# Patient Record
Sex: Female | Born: 1955 | Race: White | Hispanic: No | Smoking: Former smoker
Health system: Southern US, Community
[De-identification: ages and names within clinical notes are randomized; demographics above are authoritative.]

## PROBLEM LIST (undated history)

## (undated) DIAGNOSIS — H53489 Generalized contraction of visual field, unspecified eye: Secondary | ICD-10-CM

## (undated) DIAGNOSIS — K219 Gastro-esophageal reflux disease without esophagitis: Secondary | ICD-10-CM

## (undated) DIAGNOSIS — I82409 Acute embolism and thrombosis of unspecified deep veins of unspecified lower extremity: Secondary | ICD-10-CM

## (undated) DIAGNOSIS — Z8719 Personal history of other diseases of the digestive system: Secondary | ICD-10-CM

## (undated) DIAGNOSIS — R4182 Altered mental status, unspecified: Secondary | ICD-10-CM

## (undated) DIAGNOSIS — R519 Headache, unspecified: Secondary | ICD-10-CM

## (undated) DIAGNOSIS — R51 Headache: Secondary | ICD-10-CM

## (undated) DIAGNOSIS — G629 Polyneuropathy, unspecified: Secondary | ICD-10-CM

## (undated) DIAGNOSIS — R569 Unspecified convulsions: Secondary | ICD-10-CM

## (undated) DIAGNOSIS — I639 Cerebral infarction, unspecified: Secondary | ICD-10-CM

## (undated) DIAGNOSIS — IMO0002 Reserved for concepts with insufficient information to code with codable children: Secondary | ICD-10-CM

## (undated) DIAGNOSIS — I619 Nontraumatic intracerebral hemorrhage, unspecified: Secondary | ICD-10-CM

## (undated) DIAGNOSIS — J449 Chronic obstructive pulmonary disease, unspecified: Secondary | ICD-10-CM

## (undated) DIAGNOSIS — N200 Calculus of kidney: Secondary | ICD-10-CM

## (undated) DIAGNOSIS — Z87442 Personal history of urinary calculi: Secondary | ICD-10-CM

## (undated) DIAGNOSIS — Z9289 Personal history of other medical treatment: Secondary | ICD-10-CM

## (undated) DIAGNOSIS — G8929 Other chronic pain: Secondary | ICD-10-CM

## (undated) DIAGNOSIS — Z8711 Personal history of peptic ulcer disease: Secondary | ICD-10-CM

## (undated) DIAGNOSIS — M549 Dorsalgia, unspecified: Secondary | ICD-10-CM

## (undated) HISTORY — PX: HEMORRHOID SURGERY: SHX153

---

## 1987-08-05 DIAGNOSIS — I619 Nontraumatic intracerebral hemorrhage, unspecified: Secondary | ICD-10-CM

## 1987-08-05 HISTORY — PX: BRAIN SURGERY: SHX531

## 1987-08-05 HISTORY — DX: Nontraumatic intracerebral hemorrhage, unspecified: I61.9

## 1989-04-04 DIAGNOSIS — I639 Cerebral infarction, unspecified: Secondary | ICD-10-CM

## 1989-04-04 HISTORY — PX: TUBAL LIGATION: SHX77

## 1989-04-04 HISTORY — DX: Cerebral infarction, unspecified: I63.9

## 1998-03-22 ENCOUNTER — Emergency Department (HOSPITAL_COMMUNITY): Admission: EM | Admit: 1998-03-22 | Discharge: 1998-03-22 | Payer: Self-pay | Admitting: Emergency Medicine

## 1998-07-06 ENCOUNTER — Emergency Department (HOSPITAL_COMMUNITY): Admission: EM | Admit: 1998-07-06 | Discharge: 1998-07-06 | Payer: Self-pay | Admitting: Emergency Medicine

## 1998-07-06 ENCOUNTER — Encounter: Payer: Self-pay | Admitting: Emergency Medicine

## 1999-02-26 ENCOUNTER — Emergency Department (HOSPITAL_COMMUNITY): Admission: EM | Admit: 1999-02-26 | Discharge: 1999-02-26 | Payer: Self-pay | Admitting: Emergency Medicine

## 1999-02-26 ENCOUNTER — Encounter: Payer: Self-pay | Admitting: Emergency Medicine

## 1999-05-23 ENCOUNTER — Emergency Department (HOSPITAL_COMMUNITY): Admission: EM | Admit: 1999-05-23 | Discharge: 1999-05-23 | Payer: Self-pay | Admitting: *Deleted

## 1999-05-24 ENCOUNTER — Emergency Department (HOSPITAL_COMMUNITY): Admission: EM | Admit: 1999-05-24 | Discharge: 1999-05-24 | Payer: Self-pay

## 1999-06-10 ENCOUNTER — Encounter: Admission: RE | Admit: 1999-06-10 | Discharge: 1999-09-08 | Payer: Self-pay | Admitting: Pediatrics

## 1999-07-09 ENCOUNTER — Encounter: Payer: Self-pay | Admitting: Emergency Medicine

## 1999-07-09 ENCOUNTER — Encounter: Payer: Self-pay | Admitting: Neurology

## 1999-07-09 ENCOUNTER — Emergency Department (HOSPITAL_COMMUNITY): Admission: EM | Admit: 1999-07-09 | Discharge: 1999-07-09 | Payer: Self-pay | Admitting: Emergency Medicine

## 1999-07-10 ENCOUNTER — Inpatient Hospital Stay (HOSPITAL_COMMUNITY): Admission: EM | Admit: 1999-07-10 | Discharge: 1999-07-11 | Payer: Self-pay | Admitting: Emergency Medicine

## 1999-07-10 ENCOUNTER — Encounter: Payer: Self-pay | Admitting: Emergency Medicine

## 1999-08-15 ENCOUNTER — Encounter: Payer: Self-pay | Admitting: Emergency Medicine

## 1999-08-15 ENCOUNTER — Emergency Department (HOSPITAL_COMMUNITY): Admission: EM | Admit: 1999-08-15 | Discharge: 1999-08-15 | Payer: Self-pay | Admitting: Emergency Medicine

## 1999-09-29 ENCOUNTER — Emergency Department (HOSPITAL_COMMUNITY): Admission: EM | Admit: 1999-09-29 | Discharge: 1999-09-29 | Payer: Self-pay | Admitting: Emergency Medicine

## 1999-10-31 ENCOUNTER — Emergency Department (HOSPITAL_COMMUNITY): Admission: EM | Admit: 1999-10-31 | Discharge: 1999-11-01 | Payer: Self-pay | Admitting: Emergency Medicine

## 2000-01-11 ENCOUNTER — Emergency Department (HOSPITAL_COMMUNITY): Admission: EM | Admit: 2000-01-11 | Discharge: 2000-01-11 | Payer: Self-pay | Admitting: Emergency Medicine

## 2000-05-09 ENCOUNTER — Emergency Department (HOSPITAL_COMMUNITY): Admission: EM | Admit: 2000-05-09 | Discharge: 2000-05-09 | Payer: Self-pay | Admitting: Emergency Medicine

## 2000-07-07 ENCOUNTER — Encounter: Payer: Self-pay | Admitting: Emergency Medicine

## 2000-07-07 ENCOUNTER — Emergency Department (HOSPITAL_COMMUNITY): Admission: EM | Admit: 2000-07-07 | Discharge: 2000-07-07 | Payer: Self-pay | Admitting: Emergency Medicine

## 2000-07-27 ENCOUNTER — Emergency Department (HOSPITAL_COMMUNITY): Admission: EM | Admit: 2000-07-27 | Discharge: 2000-07-27 | Payer: Self-pay | Admitting: Emergency Medicine

## 2000-07-27 ENCOUNTER — Encounter: Payer: Self-pay | Admitting: Emergency Medicine

## 2000-07-27 ENCOUNTER — Encounter: Payer: Self-pay | Admitting: Neurology

## 2000-10-13 ENCOUNTER — Encounter: Payer: Self-pay | Admitting: Emergency Medicine

## 2000-10-13 ENCOUNTER — Emergency Department (HOSPITAL_COMMUNITY): Admission: EM | Admit: 2000-10-13 | Discharge: 2000-10-14 | Payer: Self-pay | Admitting: Emergency Medicine

## 2000-10-23 ENCOUNTER — Emergency Department (HOSPITAL_COMMUNITY): Admission: EM | Admit: 2000-10-23 | Discharge: 2000-10-24 | Payer: Self-pay | Admitting: *Deleted

## 2001-01-02 ENCOUNTER — Emergency Department (HOSPITAL_COMMUNITY): Admission: EM | Admit: 2001-01-02 | Discharge: 2001-01-02 | Payer: Self-pay | Admitting: Emergency Medicine

## 2001-02-12 ENCOUNTER — Encounter: Payer: Self-pay | Admitting: Internal Medicine

## 2001-02-12 ENCOUNTER — Emergency Department (HOSPITAL_COMMUNITY): Admission: EM | Admit: 2001-02-12 | Discharge: 2001-02-12 | Payer: Self-pay | Admitting: Emergency Medicine

## 2001-02-19 ENCOUNTER — Ambulatory Visit (HOSPITAL_COMMUNITY): Admission: RE | Admit: 2001-02-19 | Discharge: 2001-02-19 | Payer: Self-pay | Admitting: Family Medicine

## 2001-04-22 ENCOUNTER — Ambulatory Visit (HOSPITAL_COMMUNITY): Admission: RE | Admit: 2001-04-22 | Discharge: 2001-04-22 | Payer: Self-pay | Admitting: Gastroenterology

## 2001-06-19 ENCOUNTER — Emergency Department (HOSPITAL_COMMUNITY): Admission: EM | Admit: 2001-06-19 | Discharge: 2001-06-19 | Payer: Self-pay | Admitting: Emergency Medicine

## 2001-06-19 ENCOUNTER — Encounter: Payer: Self-pay | Admitting: Emergency Medicine

## 2001-08-01 ENCOUNTER — Emergency Department (HOSPITAL_COMMUNITY): Admission: EM | Admit: 2001-08-01 | Discharge: 2001-08-01 | Payer: Self-pay

## 2001-08-24 ENCOUNTER — Emergency Department (HOSPITAL_COMMUNITY): Admission: EM | Admit: 2001-08-24 | Discharge: 2001-08-24 | Payer: Self-pay | Admitting: Emergency Medicine

## 2001-12-22 ENCOUNTER — Ambulatory Visit (HOSPITAL_COMMUNITY): Admission: RE | Admit: 2001-12-22 | Discharge: 2001-12-22 | Payer: Self-pay | Admitting: Internal Medicine

## 2001-12-23 ENCOUNTER — Emergency Department (HOSPITAL_COMMUNITY): Admission: EM | Admit: 2001-12-23 | Discharge: 2001-12-23 | Payer: Self-pay | Admitting: Emergency Medicine

## 2002-07-26 ENCOUNTER — Emergency Department (HOSPITAL_COMMUNITY): Admission: EM | Admit: 2002-07-26 | Discharge: 2002-07-26 | Payer: Self-pay | Admitting: Emergency Medicine

## 2002-07-26 ENCOUNTER — Encounter: Payer: Self-pay | Admitting: Emergency Medicine

## 2002-08-31 ENCOUNTER — Emergency Department (HOSPITAL_COMMUNITY): Admission: EM | Admit: 2002-08-31 | Discharge: 2002-08-31 | Payer: Self-pay | Admitting: Emergency Medicine

## 2002-09-12 ENCOUNTER — Emergency Department (HOSPITAL_COMMUNITY): Admission: EM | Admit: 2002-09-12 | Discharge: 2002-09-12 | Payer: Self-pay | Admitting: Emergency Medicine

## 2002-09-12 ENCOUNTER — Encounter: Payer: Self-pay | Admitting: Emergency Medicine

## 2002-11-30 ENCOUNTER — Emergency Department (HOSPITAL_COMMUNITY): Admission: EM | Admit: 2002-11-30 | Discharge: 2002-11-30 | Payer: Self-pay | Admitting: Emergency Medicine

## 2002-11-30 ENCOUNTER — Encounter: Payer: Self-pay | Admitting: Emergency Medicine

## 2002-12-03 ENCOUNTER — Emergency Department (HOSPITAL_COMMUNITY): Admission: EM | Admit: 2002-12-03 | Discharge: 2002-12-03 | Payer: Self-pay

## 2003-02-01 ENCOUNTER — Encounter: Payer: Self-pay | Admitting: Emergency Medicine

## 2003-02-01 ENCOUNTER — Emergency Department (HOSPITAL_COMMUNITY): Admission: EM | Admit: 2003-02-01 | Discharge: 2003-02-01 | Payer: Self-pay | Admitting: Emergency Medicine

## 2003-04-25 ENCOUNTER — Encounter: Payer: Self-pay | Admitting: Emergency Medicine

## 2003-04-25 ENCOUNTER — Emergency Department (HOSPITAL_COMMUNITY): Admission: EM | Admit: 2003-04-25 | Discharge: 2003-04-25 | Payer: Self-pay | Admitting: Internal Medicine

## 2003-05-12 ENCOUNTER — Emergency Department (HOSPITAL_COMMUNITY): Admission: EM | Admit: 2003-05-12 | Discharge: 2003-05-13 | Payer: Self-pay | Admitting: Emergency Medicine

## 2003-06-22 ENCOUNTER — Encounter: Admission: RE | Admit: 2003-06-22 | Discharge: 2003-06-22 | Payer: Self-pay | Admitting: Family Medicine

## 2003-07-10 ENCOUNTER — Encounter: Admission: RE | Admit: 2003-07-10 | Discharge: 2003-08-15 | Payer: Self-pay | Admitting: Family Medicine

## 2003-11-18 ENCOUNTER — Emergency Department (HOSPITAL_COMMUNITY): Admission: EM | Admit: 2003-11-18 | Discharge: 2003-11-18 | Payer: Self-pay | Admitting: Emergency Medicine

## 2004-01-04 ENCOUNTER — Inpatient Hospital Stay (HOSPITAL_COMMUNITY): Admission: EM | Admit: 2004-01-04 | Discharge: 2004-01-05 | Payer: Self-pay | Admitting: Emergency Medicine

## 2004-01-28 ENCOUNTER — Emergency Department (HOSPITAL_COMMUNITY): Admission: EM | Admit: 2004-01-28 | Discharge: 2004-01-28 | Payer: Self-pay | Admitting: *Deleted

## 2004-02-02 IMAGING — CT CT CHEST W/ CM
3 series · 19 of 29 positions shown, 20 images · IV contrast (omnipaque)
Comparison: none

FINDINGS
CLINICAL DATA: CHEST PAIN.  SHORTNESS OF BREATH.  FEVER.  HISTORY OF PREVIOUS DVT.
CT SCAN OF THE CHEST WITH INTRAVENOUS CONTRAST FOR PE PROTOCOL
THE STUDY WAS PERFORMED WITH 150 CC OF OMNIPAQUE 300.
THERE IS NO EVIDENCE OF PULMONARY EMBOLI.  NO MEDIASTINAL OR HILAR MASSES OR ADENOPATHY ARE
APPRECIATED.
IMPRESSION
NEGATIVE.
SCREENING CT SCAN OF THE LOWER EXTREMITIES FOR LONG SEGMENT DVT WITH CONTRAST
THERE IS NO EVIDENCE OF LONG SEGMENT DVT.

[Series 2: dvt 5.0 b30s · axial · 0.64mm/px · z∈[-832,-598]mm · 3 of 25 slices shown, 4 images]
[im 7/25  mediastinal]
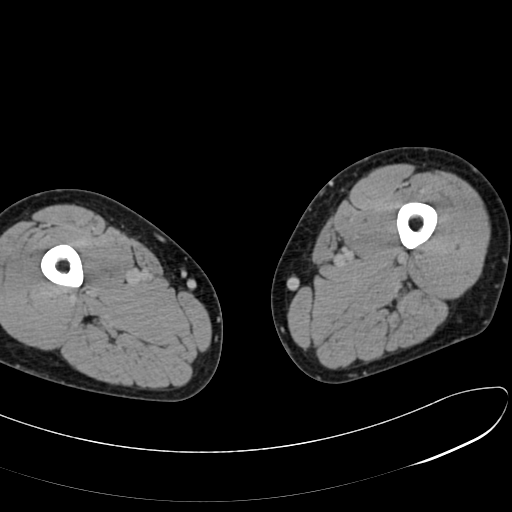
[im 7/25  lung]
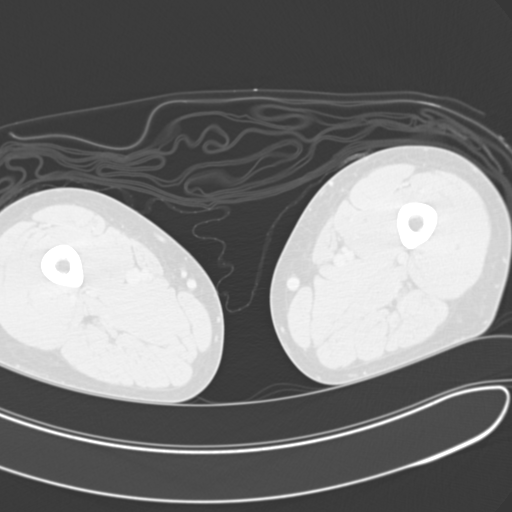
[im 13/25  lung]
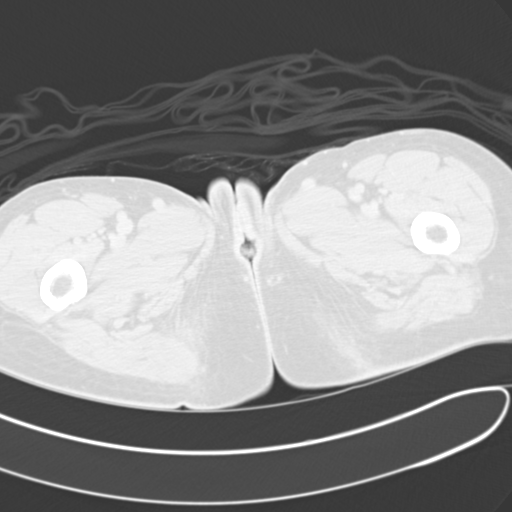
[im 19/25  lung]
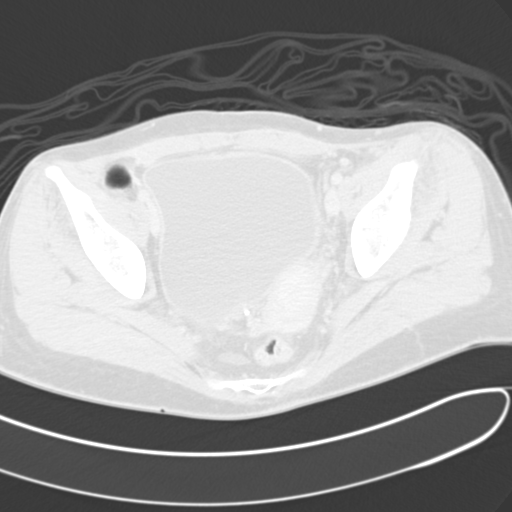

[Series 3: chest/pe 3.0 b30f · axial · 0.66mm/px · z∈[-340,-108]mm · 8 of 64 slices shown]
[im 6/64  lung]
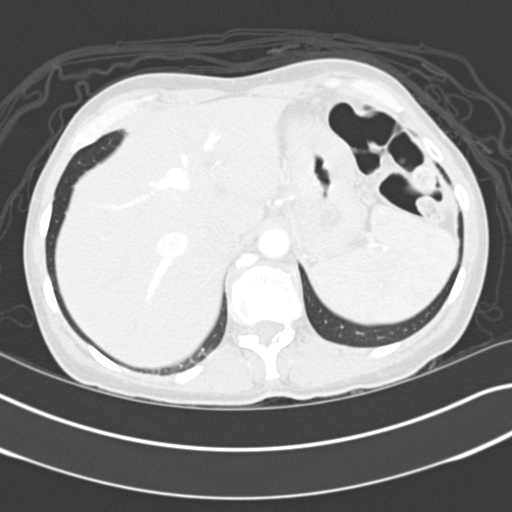
[im 18/64  lung]
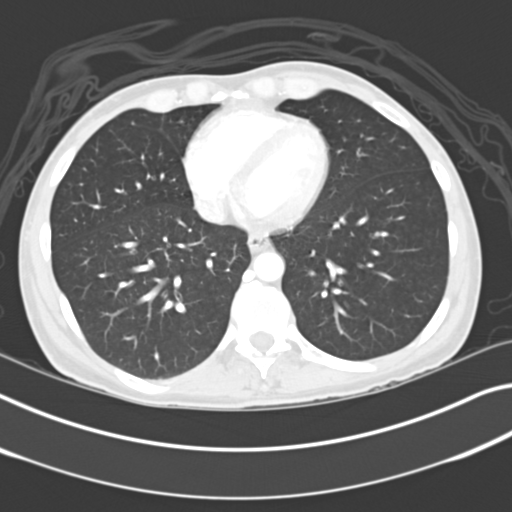
[im 23/64  lung]
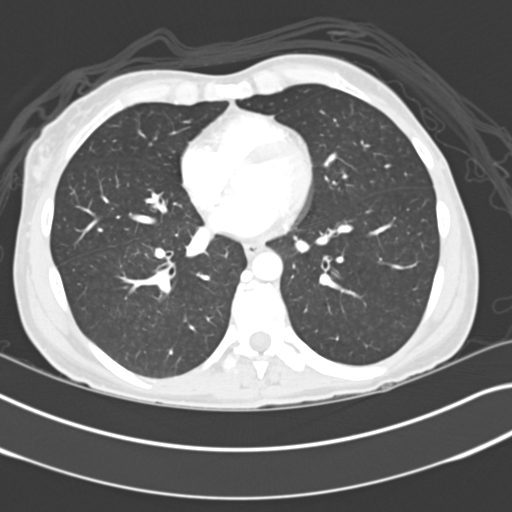
[im 29/64  lung]
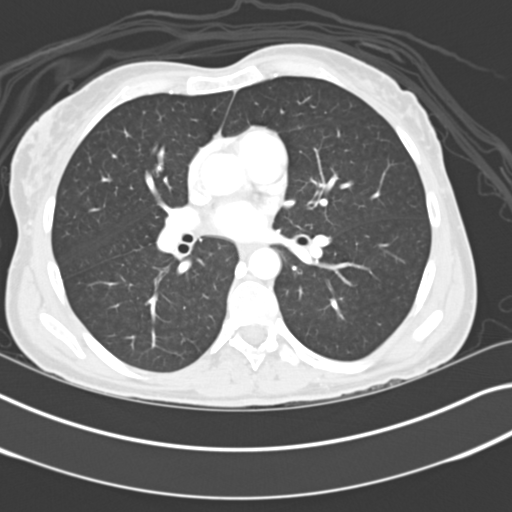
[im 35/64  lung]
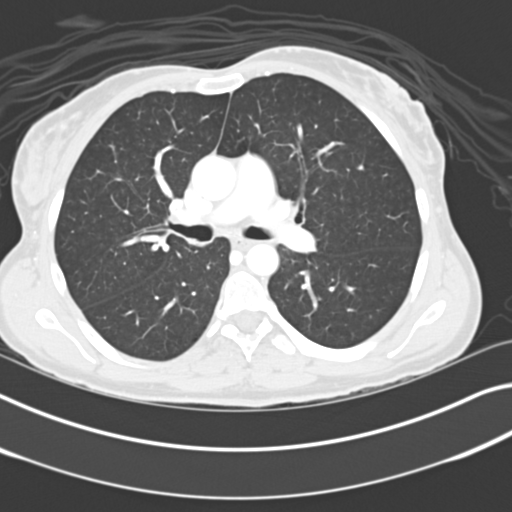
[im 41/64  lung]
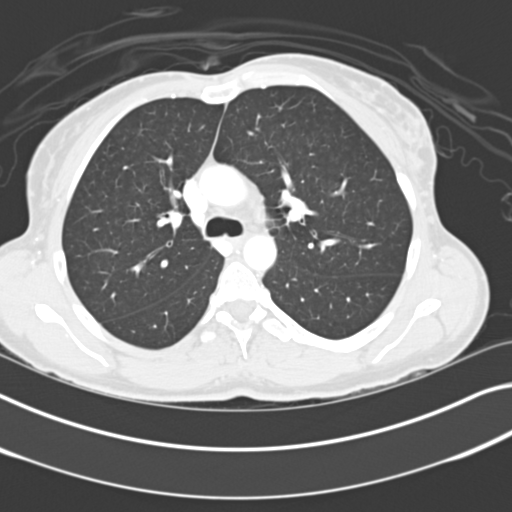
[im 46/64  lung]
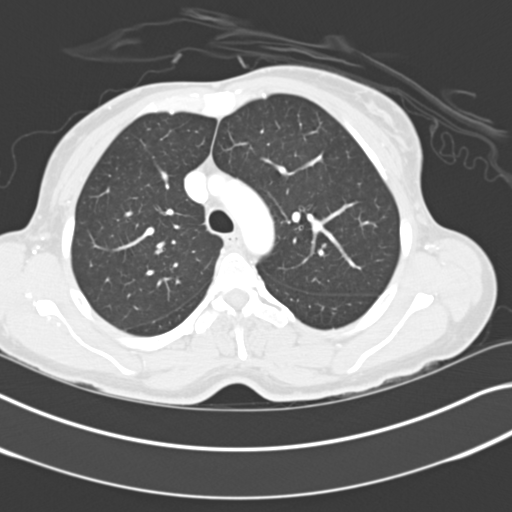
[im 58/64  lung]
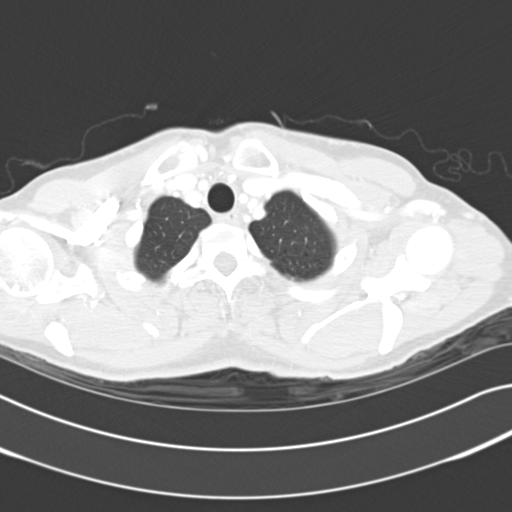

[Series 4: chest/pe 3.0 b70f · axial · 0.66mm/px · z∈[-340,-108]mm · 8 of 59 slices shown]
[im 6/59  lung]
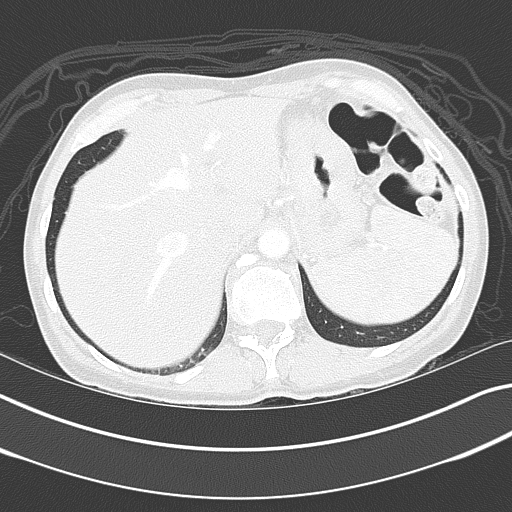
[im 12/59  lung]
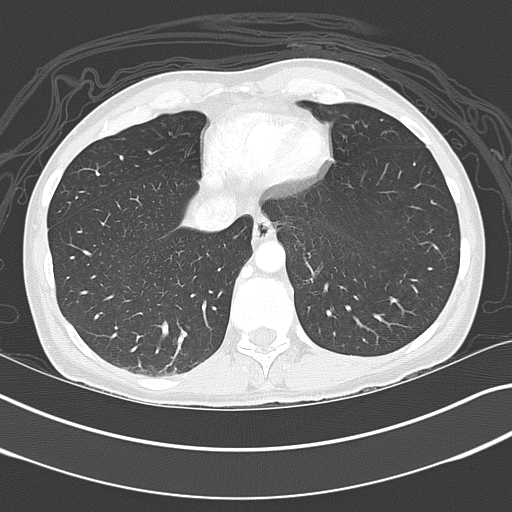
[im 18/59  lung]
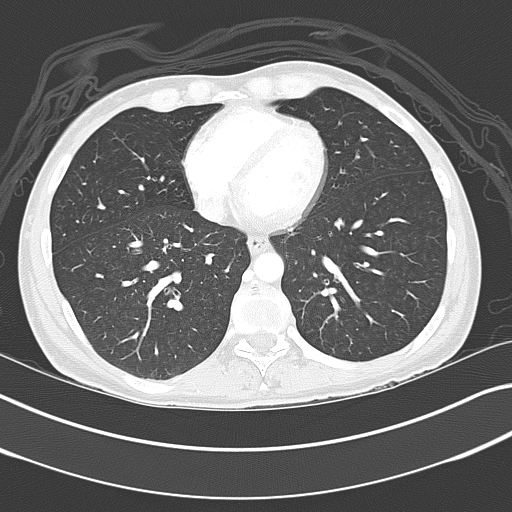
[im 24/59  lung]
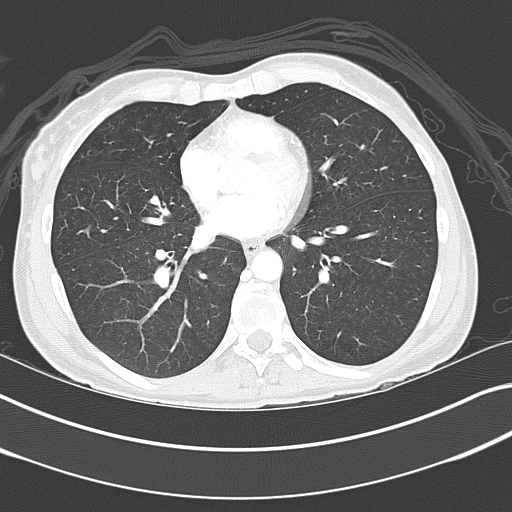
[im 35/59  lung]
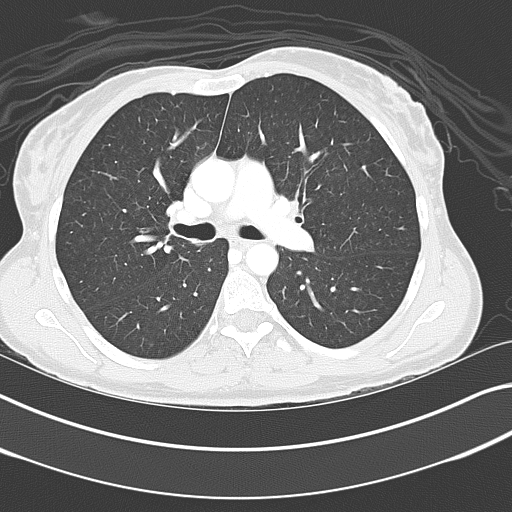
[im 41/59  lung]
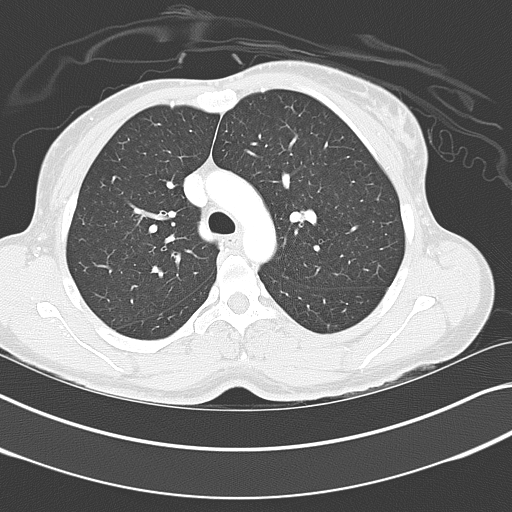
[im 47/59  lung]
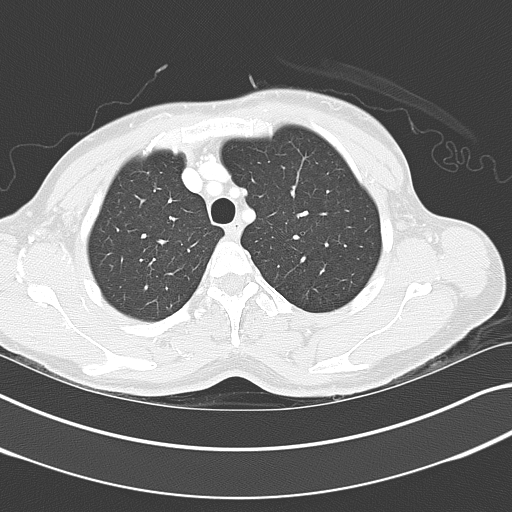
[im 53/59  lung]
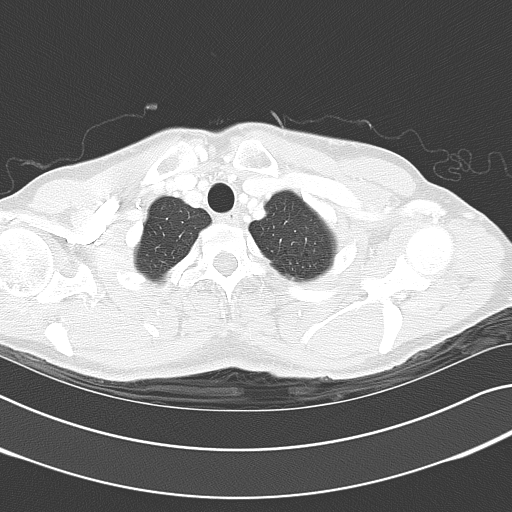

[19 of 29 positions shown; findings below may reference images not displayed]

## 2004-02-25 ENCOUNTER — Inpatient Hospital Stay (HOSPITAL_COMMUNITY): Admission: EM | Admit: 2004-02-25 | Discharge: 2004-02-29 | Payer: Self-pay | Admitting: Emergency Medicine

## 2004-03-09 ENCOUNTER — Emergency Department (HOSPITAL_COMMUNITY): Admission: EM | Admit: 2004-03-09 | Discharge: 2004-03-10 | Payer: Self-pay | Admitting: Emergency Medicine

## 2004-04-01 ENCOUNTER — Emergency Department (HOSPITAL_COMMUNITY): Admission: EM | Admit: 2004-04-01 | Discharge: 2004-04-01 | Payer: Self-pay | Admitting: Emergency Medicine

## 2004-04-10 ENCOUNTER — Emergency Department (HOSPITAL_COMMUNITY): Admission: EM | Admit: 2004-04-10 | Discharge: 2004-04-10 | Payer: Self-pay | Admitting: Emergency Medicine

## 2004-04-10 ENCOUNTER — Inpatient Hospital Stay (HOSPITAL_COMMUNITY): Admission: AD | Admit: 2004-04-10 | Discharge: 2004-04-12 | Payer: Self-pay | Admitting: Neurology

## 2004-05-03 ENCOUNTER — Emergency Department (HOSPITAL_COMMUNITY): Admission: EM | Admit: 2004-05-03 | Discharge: 2004-05-04 | Payer: Self-pay | Admitting: Emergency Medicine

## 2004-05-07 ENCOUNTER — Emergency Department (HOSPITAL_COMMUNITY): Admission: EM | Admit: 2004-05-07 | Discharge: 2004-05-08 | Payer: Self-pay | Admitting: Emergency Medicine

## 2004-05-12 ENCOUNTER — Emergency Department (HOSPITAL_COMMUNITY): Admission: EM | Admit: 2004-05-12 | Discharge: 2004-05-13 | Payer: Self-pay | Admitting: *Deleted

## 2004-09-24 IMAGING — CR DG CHEST 2V
3 series · 3 of 3 positions shown · non-contrast
Comparison: none

CLINICAL DATA: Asthma exacerbation.  Hypertension.  Dyspnea.  
 CHEST (TWO VIEWS) 
 No prior exam for comparison. 
 Normal heart size, mediastinal contours and vascularity.  Hyperexpanded lungs and peribronchial thickening compatible with asthmatic/COPD changes.  No infiltrate, effusion or pneumothorax.  
 IMPRESSION
 Chronic obstructive changes.  No acute abnormalities.

[view not recorded (1 of 3)]
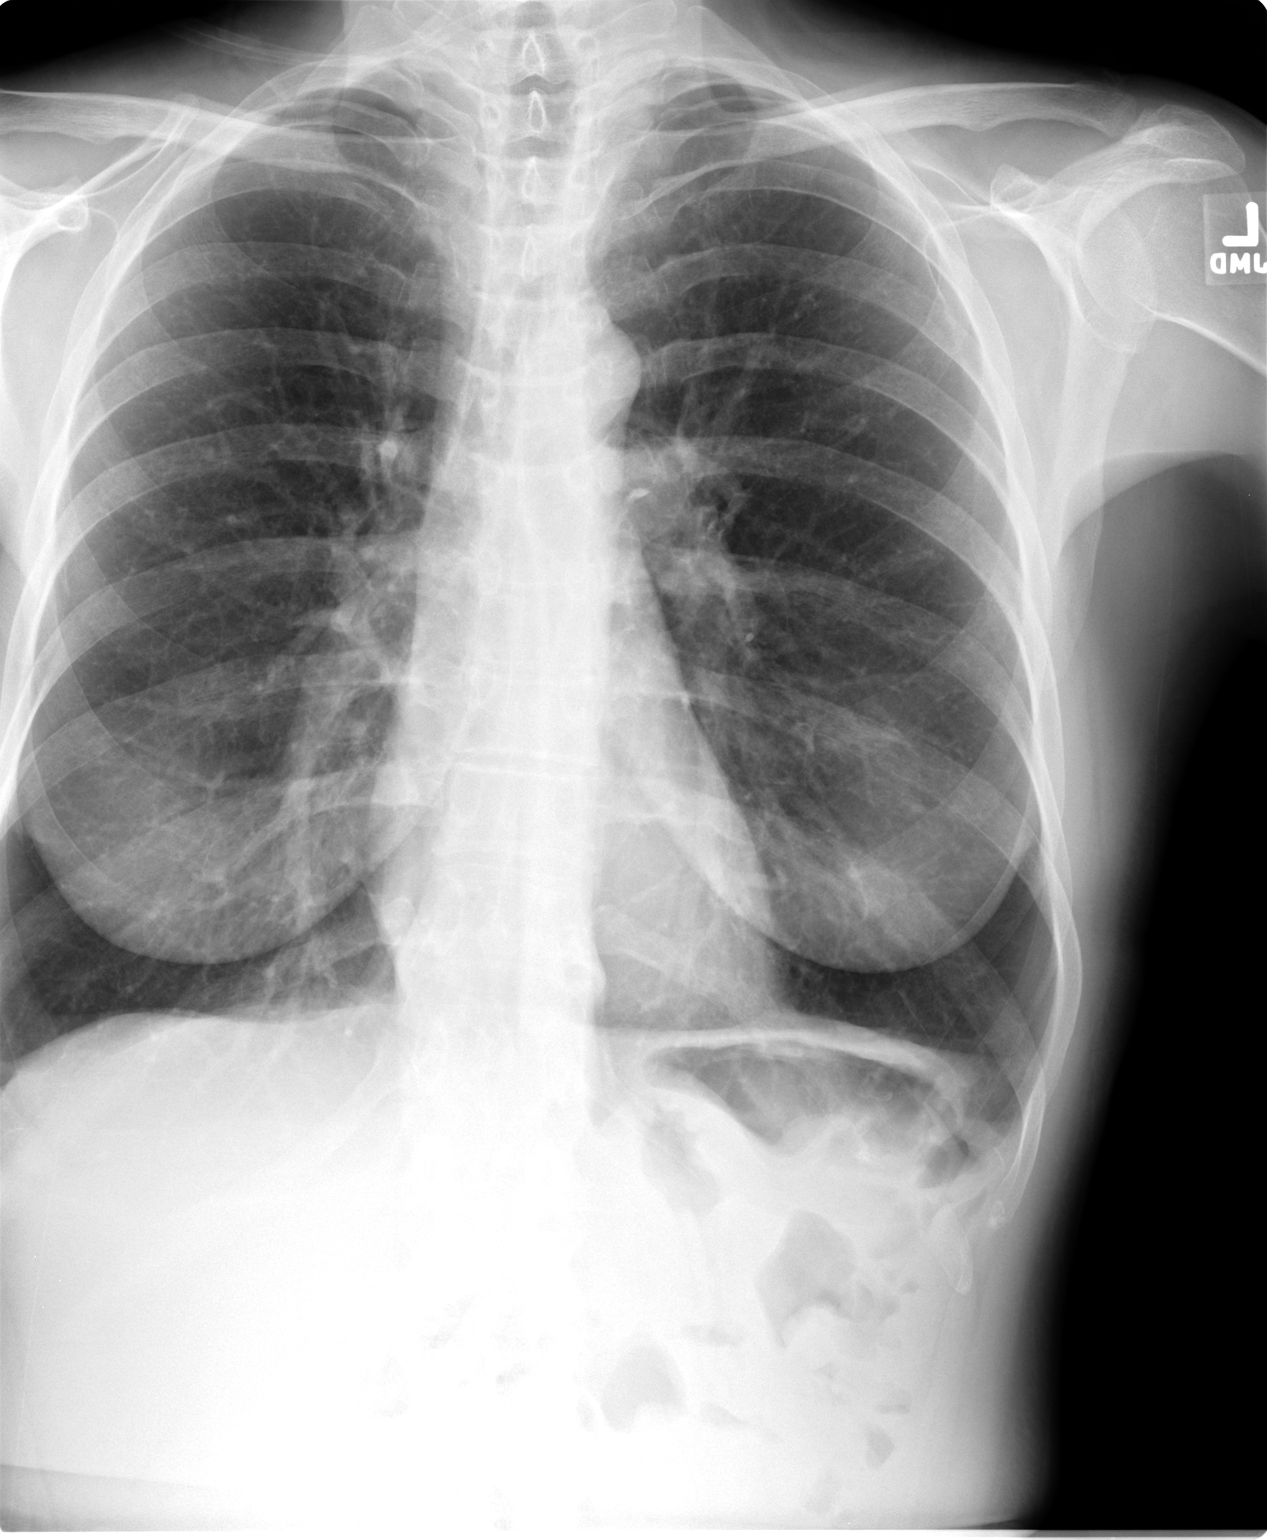

[view not recorded (2 of 3)]
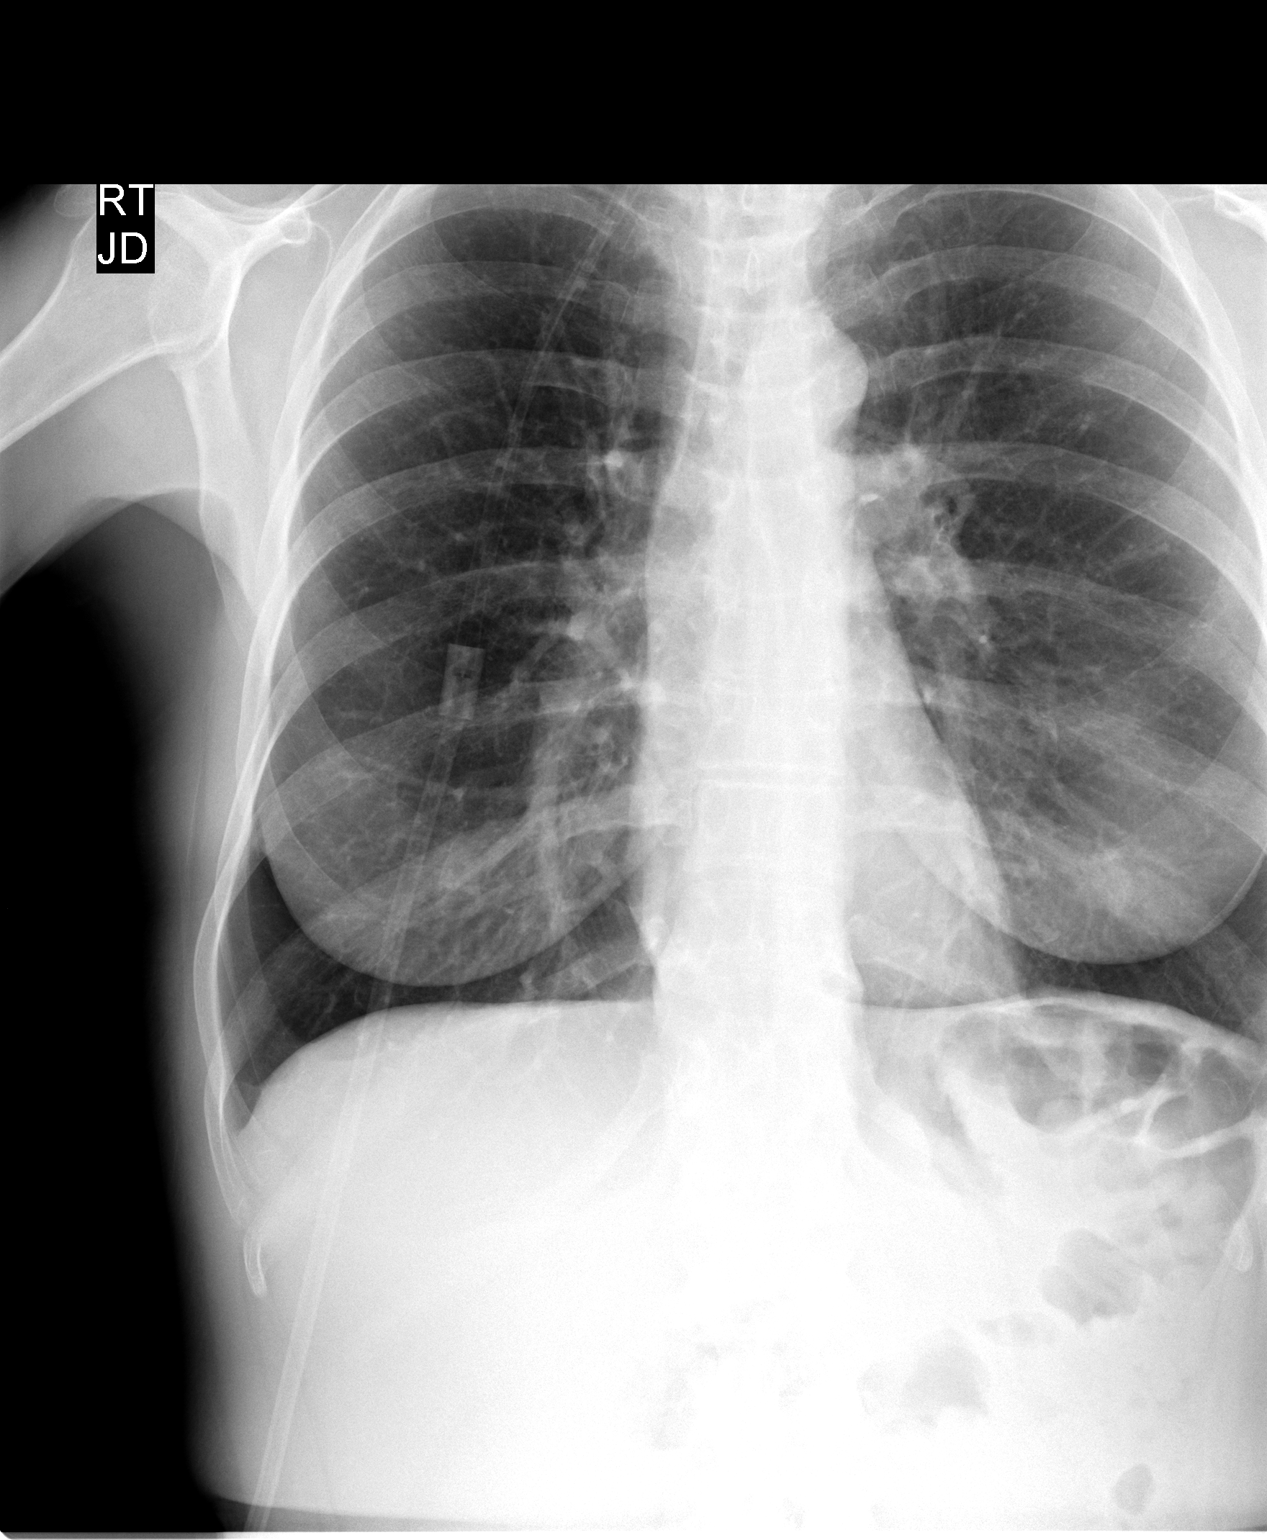

[view not recorded (3 of 3)]
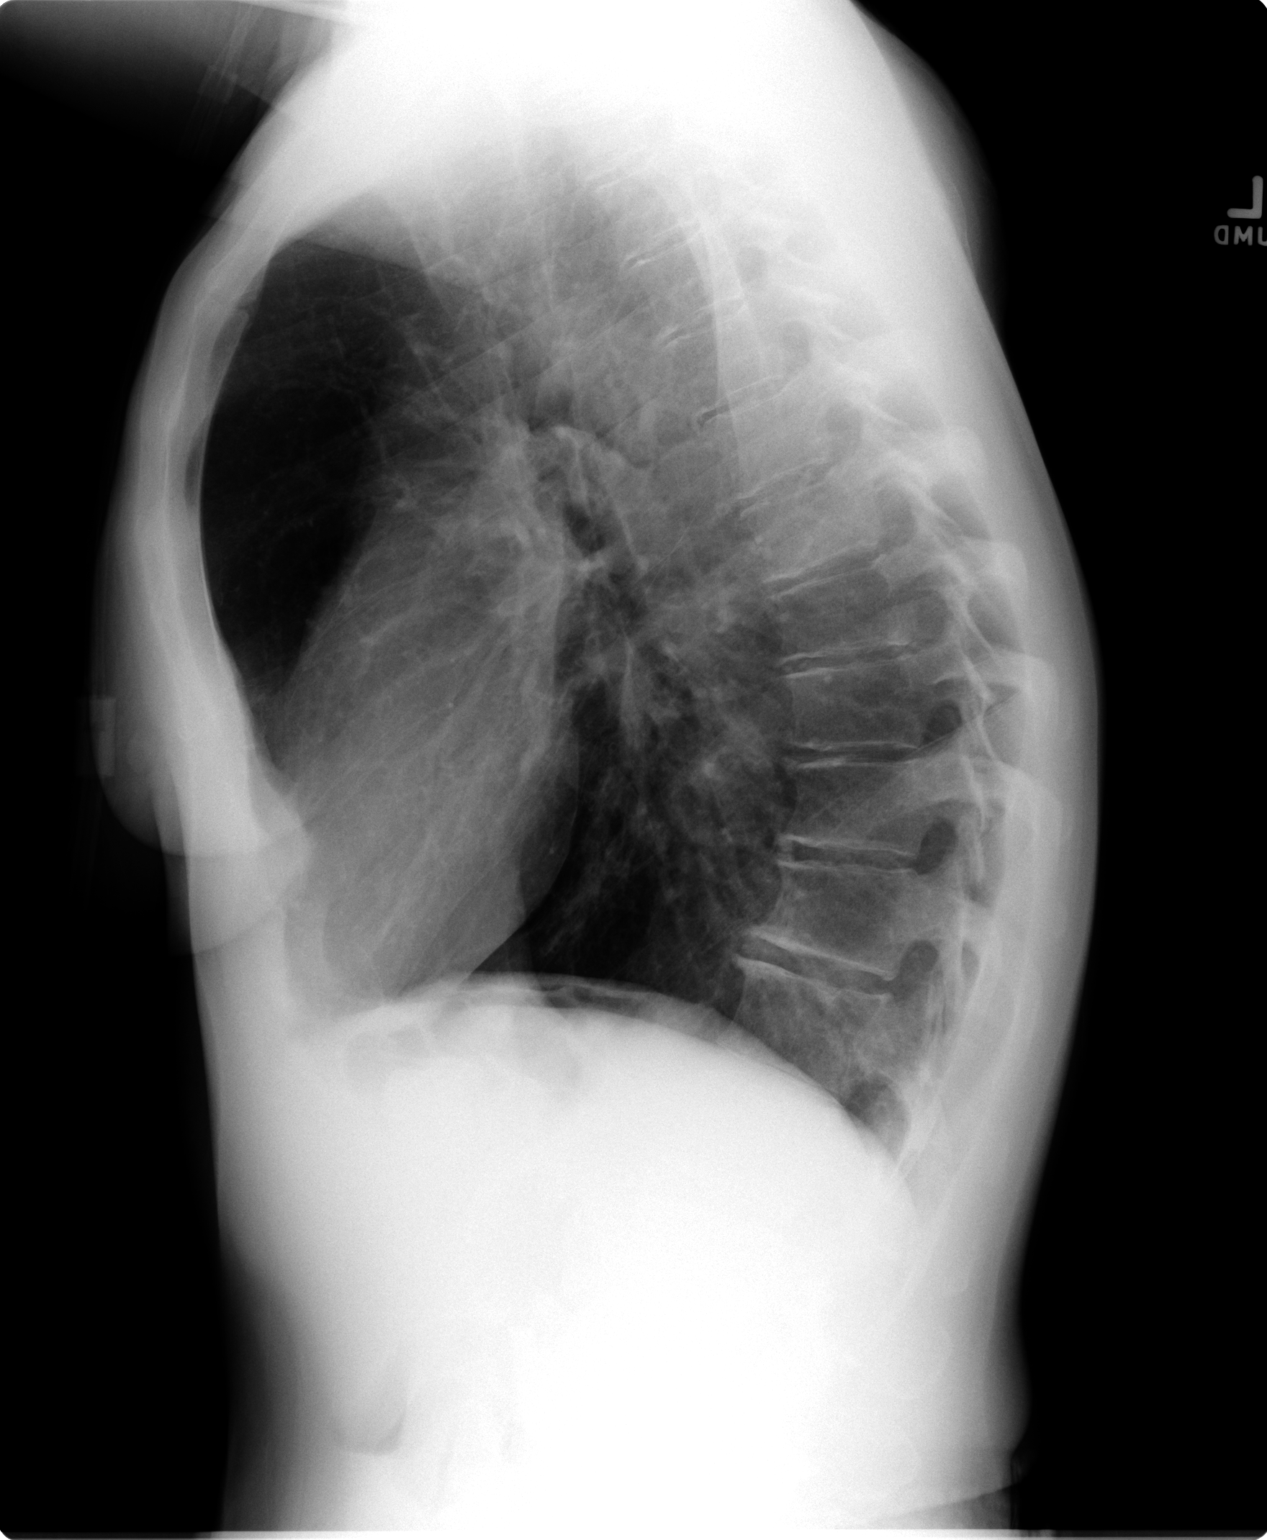

[3 of 3 positions shown; findings below may reference images not displayed]

## 2004-12-09 ENCOUNTER — Emergency Department (HOSPITAL_COMMUNITY): Admission: EM | Admit: 2004-12-09 | Discharge: 2004-12-09 | Payer: Self-pay | Admitting: *Deleted

## 2005-03-13 IMAGING — CT CT HEAD W/O CM
1 of 2 series · 13 of 30 positions shown, 17 images · non-contrast
Comparison: none

FINDINGS
CLINICAL DATA: PATIENT WITH SEIZURE.  HISTORY OF PREVIOUS INTRACEREBRAL HEMORRHAGE AND SURGERY.
CT BRAIN WITHOUT CONTRAST
COMPARISON IS MADE WITH THE PRIOR EXAMINATION DATED [DATE].
MULTIPLE 5 MM IMAGES REDEMONSTRATE THE LARGE AREA OF ENCEPHALOMALACIA IN THE RIGHT PARIETO-
OCCIPITAL REGION WITH A PREVIOUS CRANIOTOMY.  THERE IS NO INTRACEREBRAL MASS EFFECT OR HEMORRHAGE.
THERE IS NO ACUTE ABNORMALITY.
IMPRESSION
1.   STABLE RIGHT PARIETO-OCCIPITAL ENCEPHALOMALACIA.
2.   NO ACUTE ABNORMALITY.

[Series 2: — · axial · 0.43mm/px · z∈[-210,-90]mm · 13 of 28 slices shown, 17 images]
[im 2/28  brain]
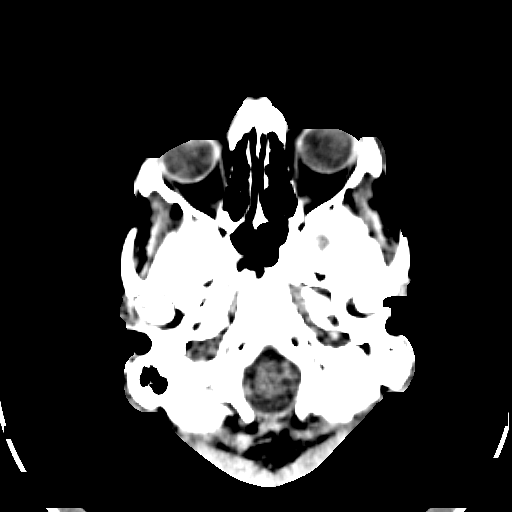
[im 2/28  bone]
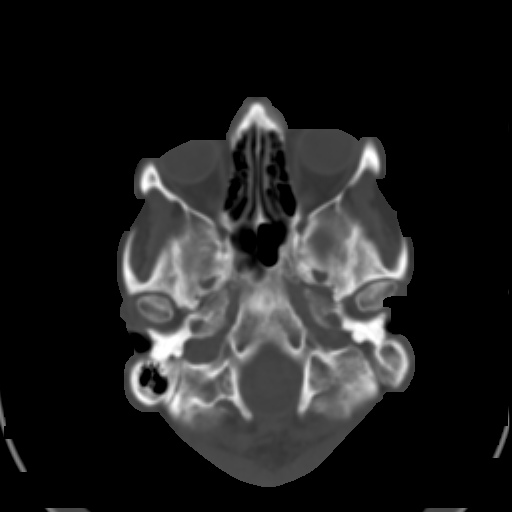
[im 4/28  brain]
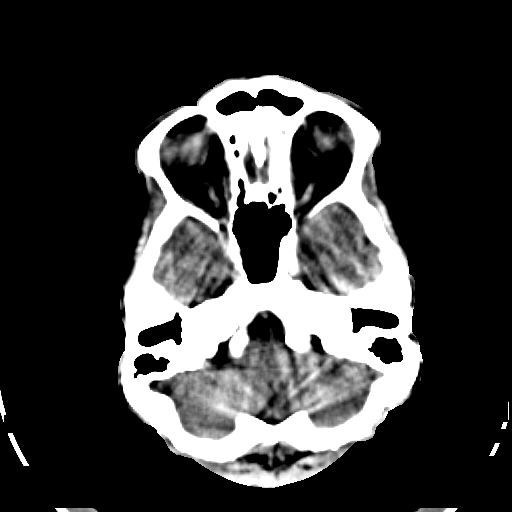
[im 6/28  brain]
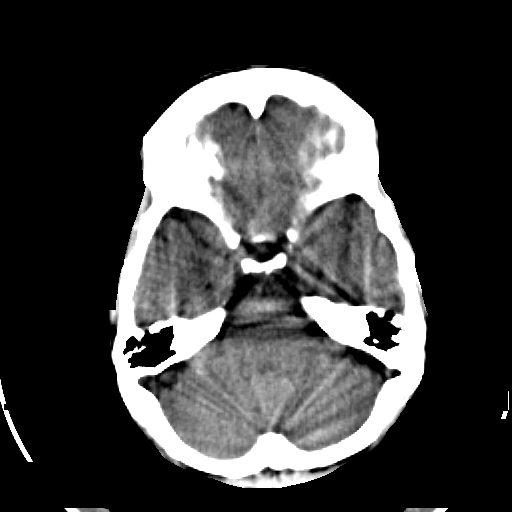
[im 8/28  brain]
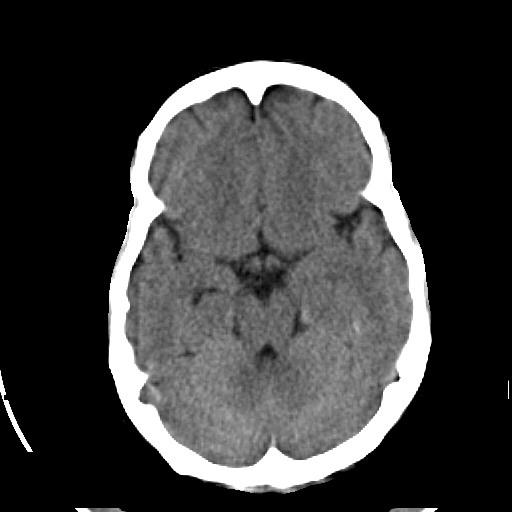
[im 10/28  brain]
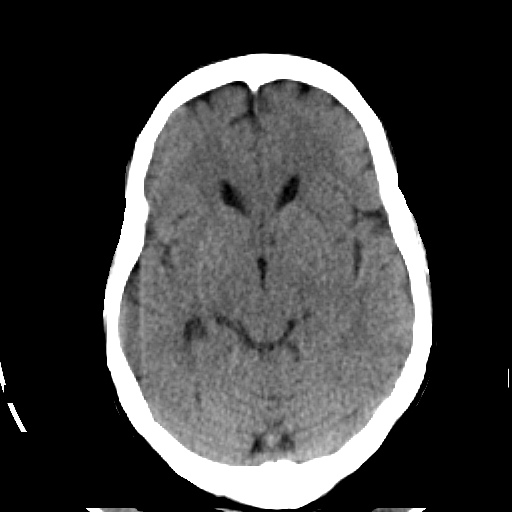
[im 10/28  bone]
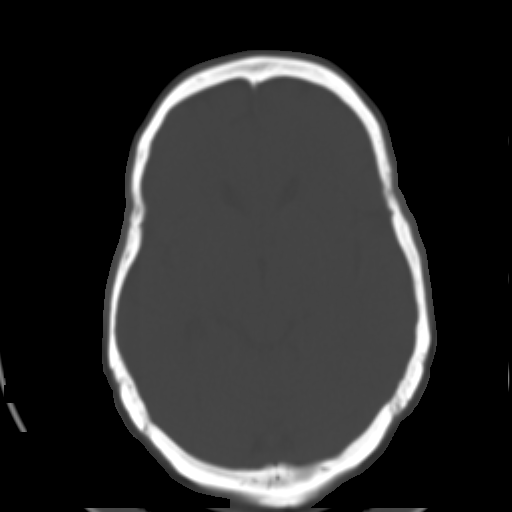
[im 12/28  brain]
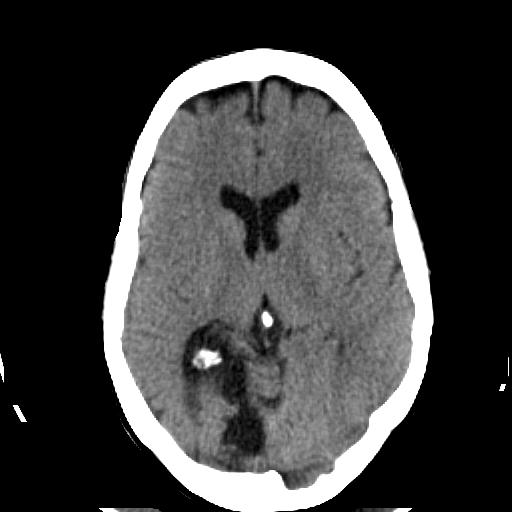
[im 14/28  brain]
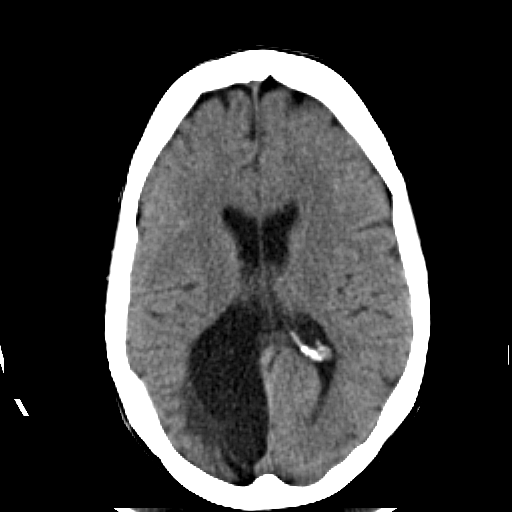
[im 16/28  brain]
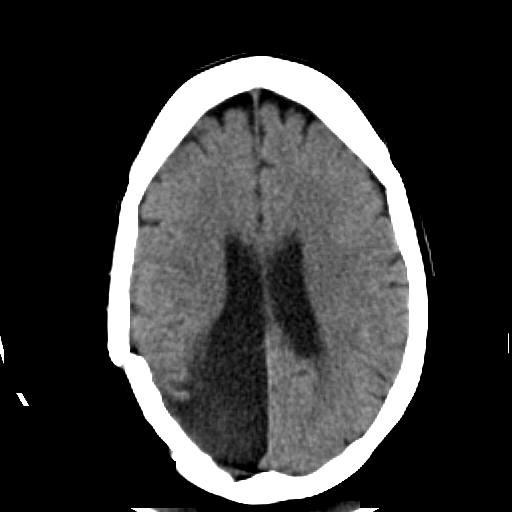
[im 18/28  brain]
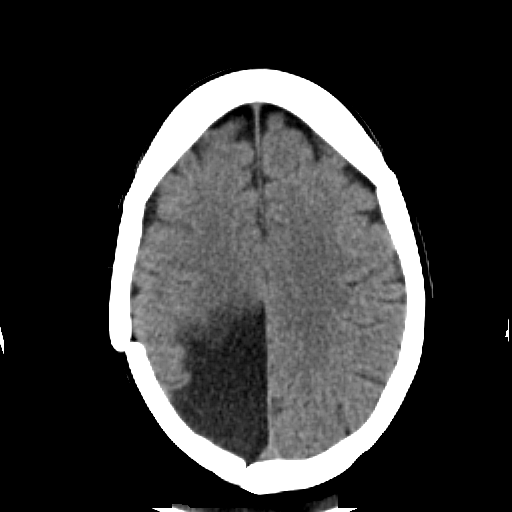
[im 18/28  bone]
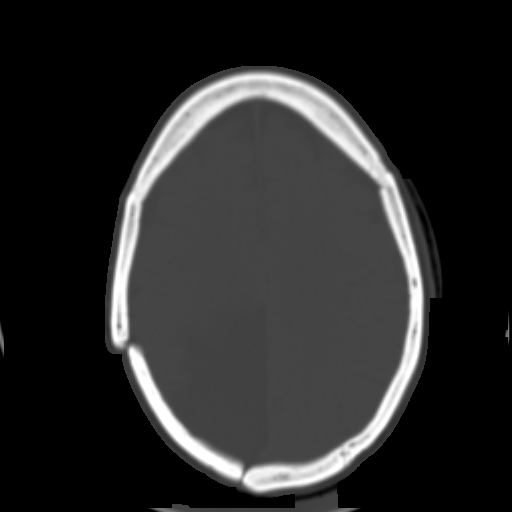
[im 20/28  brain]
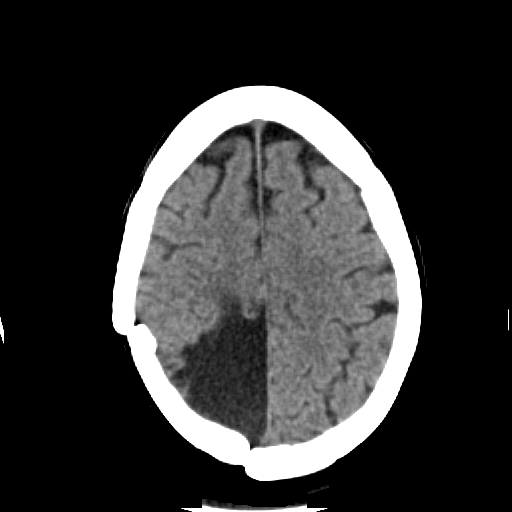
[im 22/28  brain]
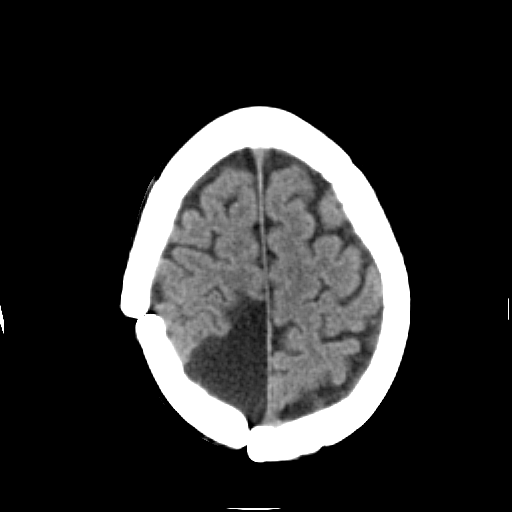
[im 24/28  brain]
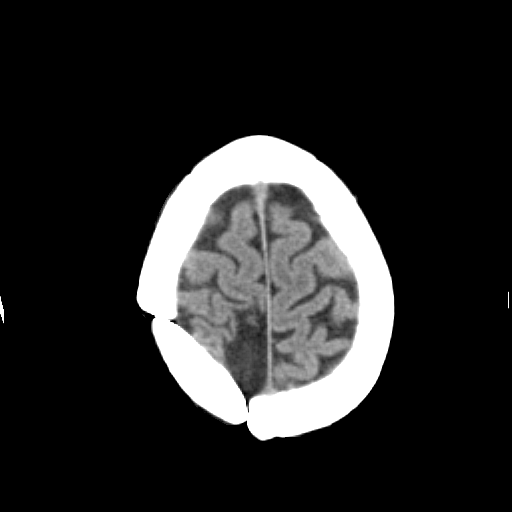
[im 26/28  brain]
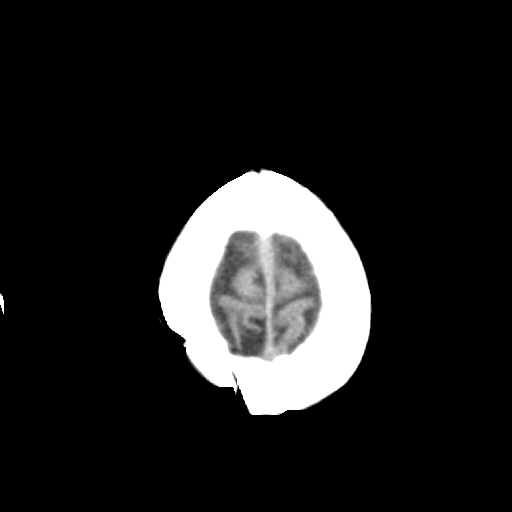
[im 26/28  bone]
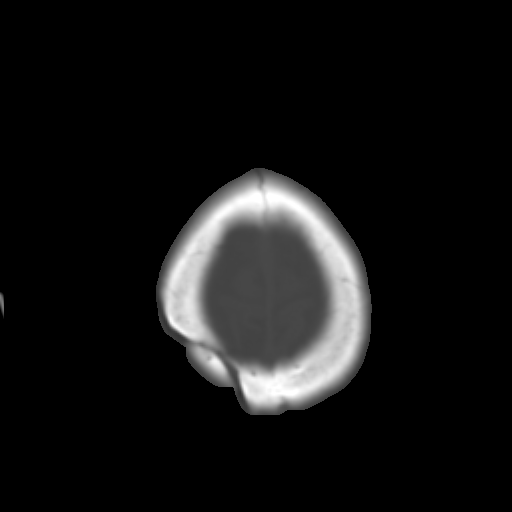

[13 of 30 positions shown; findings below may reference images not displayed]

## 2005-07-15 ENCOUNTER — Emergency Department (HOSPITAL_COMMUNITY): Admission: EM | Admit: 2005-07-15 | Discharge: 2005-07-15 | Payer: Self-pay | Admitting: Emergency Medicine
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
## 2005-10-02 ENCOUNTER — Inpatient Hospital Stay (HOSPITAL_COMMUNITY): Admission: EM | Admit: 2005-10-02 | Discharge: 2005-10-04 | Payer: Self-pay | Admitting: Emergency Medicine

## 2005-10-05 ENCOUNTER — Emergency Department (HOSPITAL_COMMUNITY): Admission: EM | Admit: 2005-10-05 | Discharge: 2005-10-05 | Payer: Self-pay | Admitting: Emergency Medicine

## 2006-03-12 ENCOUNTER — Emergency Department (HOSPITAL_COMMUNITY): Admission: EM | Admit: 2006-03-12 | Discharge: 2006-03-12 | Payer: Self-pay | Admitting: Emergency Medicine

## 2006-04-20 IMAGING — CR DG CHEST SPECIAL VIEW
1 series · 1 of 1 positions shown · non-contrast
Comparison: none

CLINICAL DATA: Asthma.
 PORTABLE CHEST, 03/10/04, [DATE] HOURS
 Nipple markers correspond to the bibasilar nodular densities.  
 IMPRESSION
 Bibasilar nodular densities represent nipple shadows.  No nodules are seen.

[view not recorded]
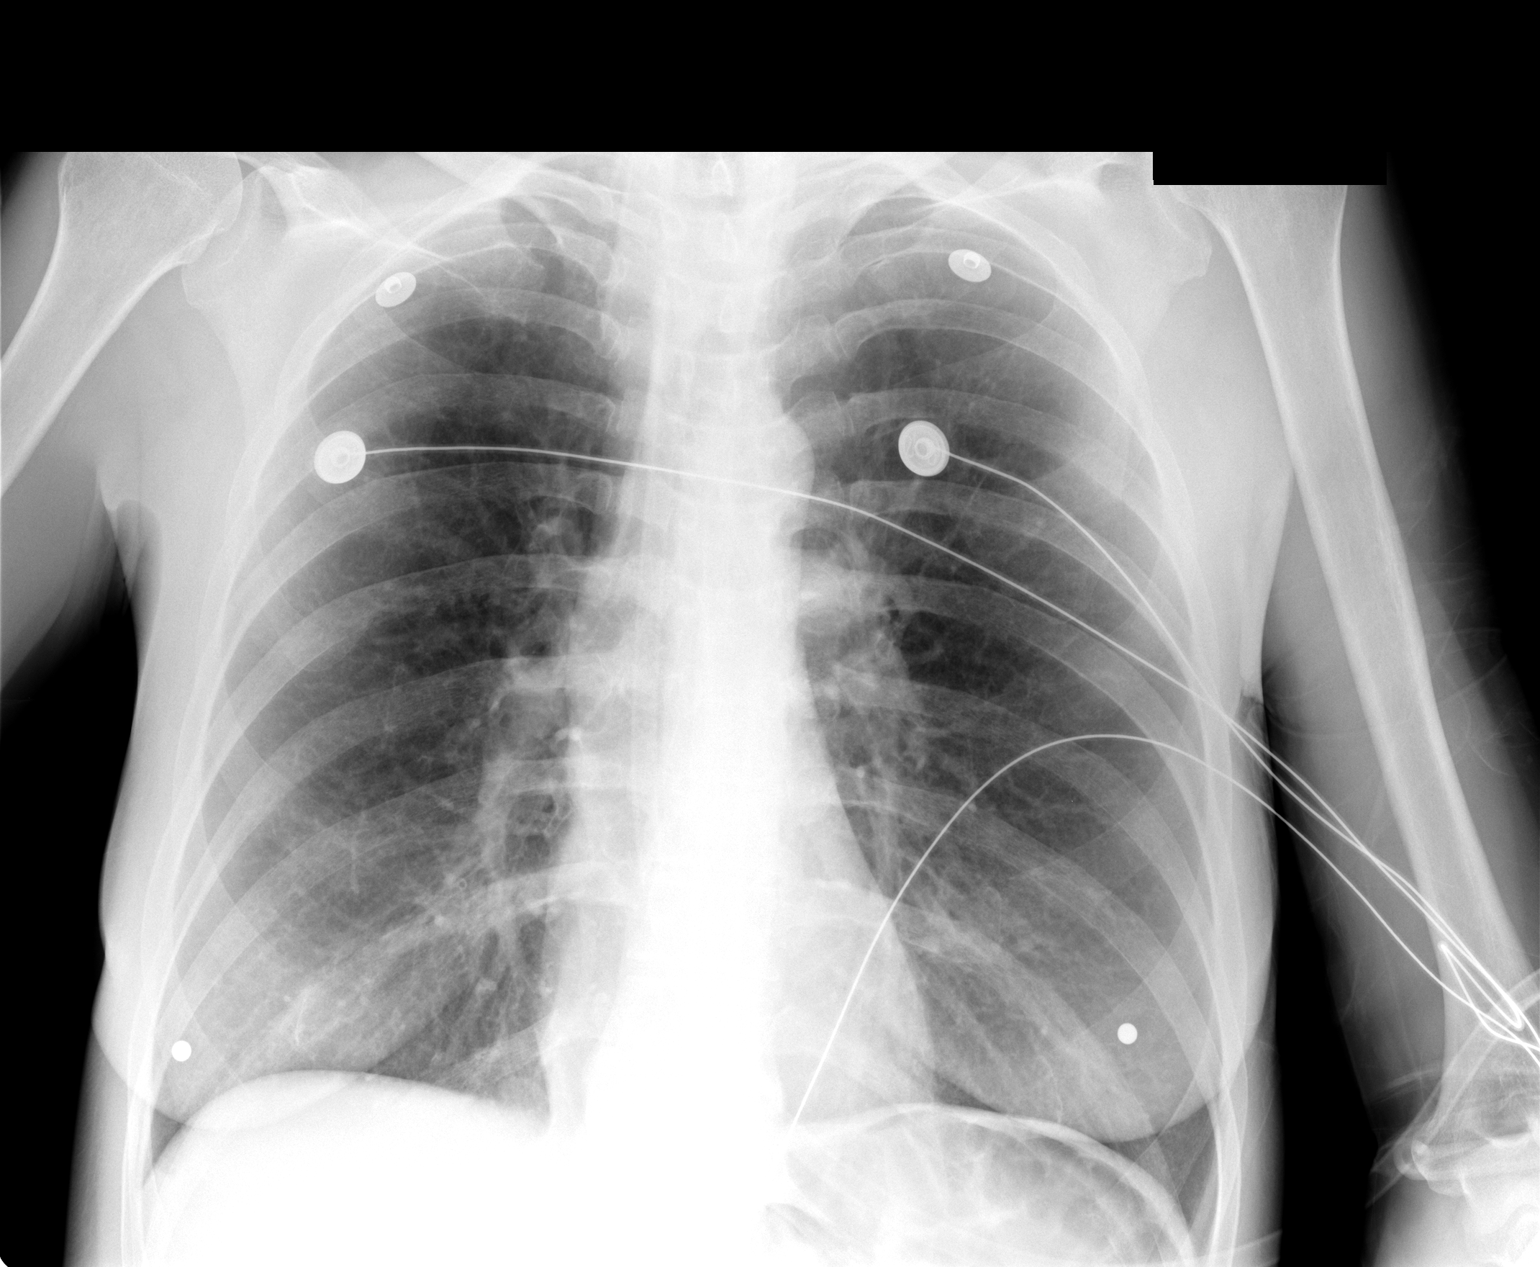

[1 of 1 positions shown; findings below may reference images not displayed]

## 2006-04-20 IMAGING — CR DG CHEST 1V PORT
1 series · 1 of 1 positions shown · non-contrast
Comparison: none

CLINICAL DATA: Short of breath.
 PORTABLE CHEST, 03/10/04, [DATE] HOURS
 The heart is normal in size.  Central bronchitic changes are present.  Bibasilar nodular densities are likely nipple shadows.  The lungs are hyperinflated.  No pneumothoraces or effusions are seen. 
 IMPRESSION
 Hyperinflation and bronchitic changes.  Bibasilar nipple shadows are suspected and can be confirmed with nipple markers.

[view not recorded]
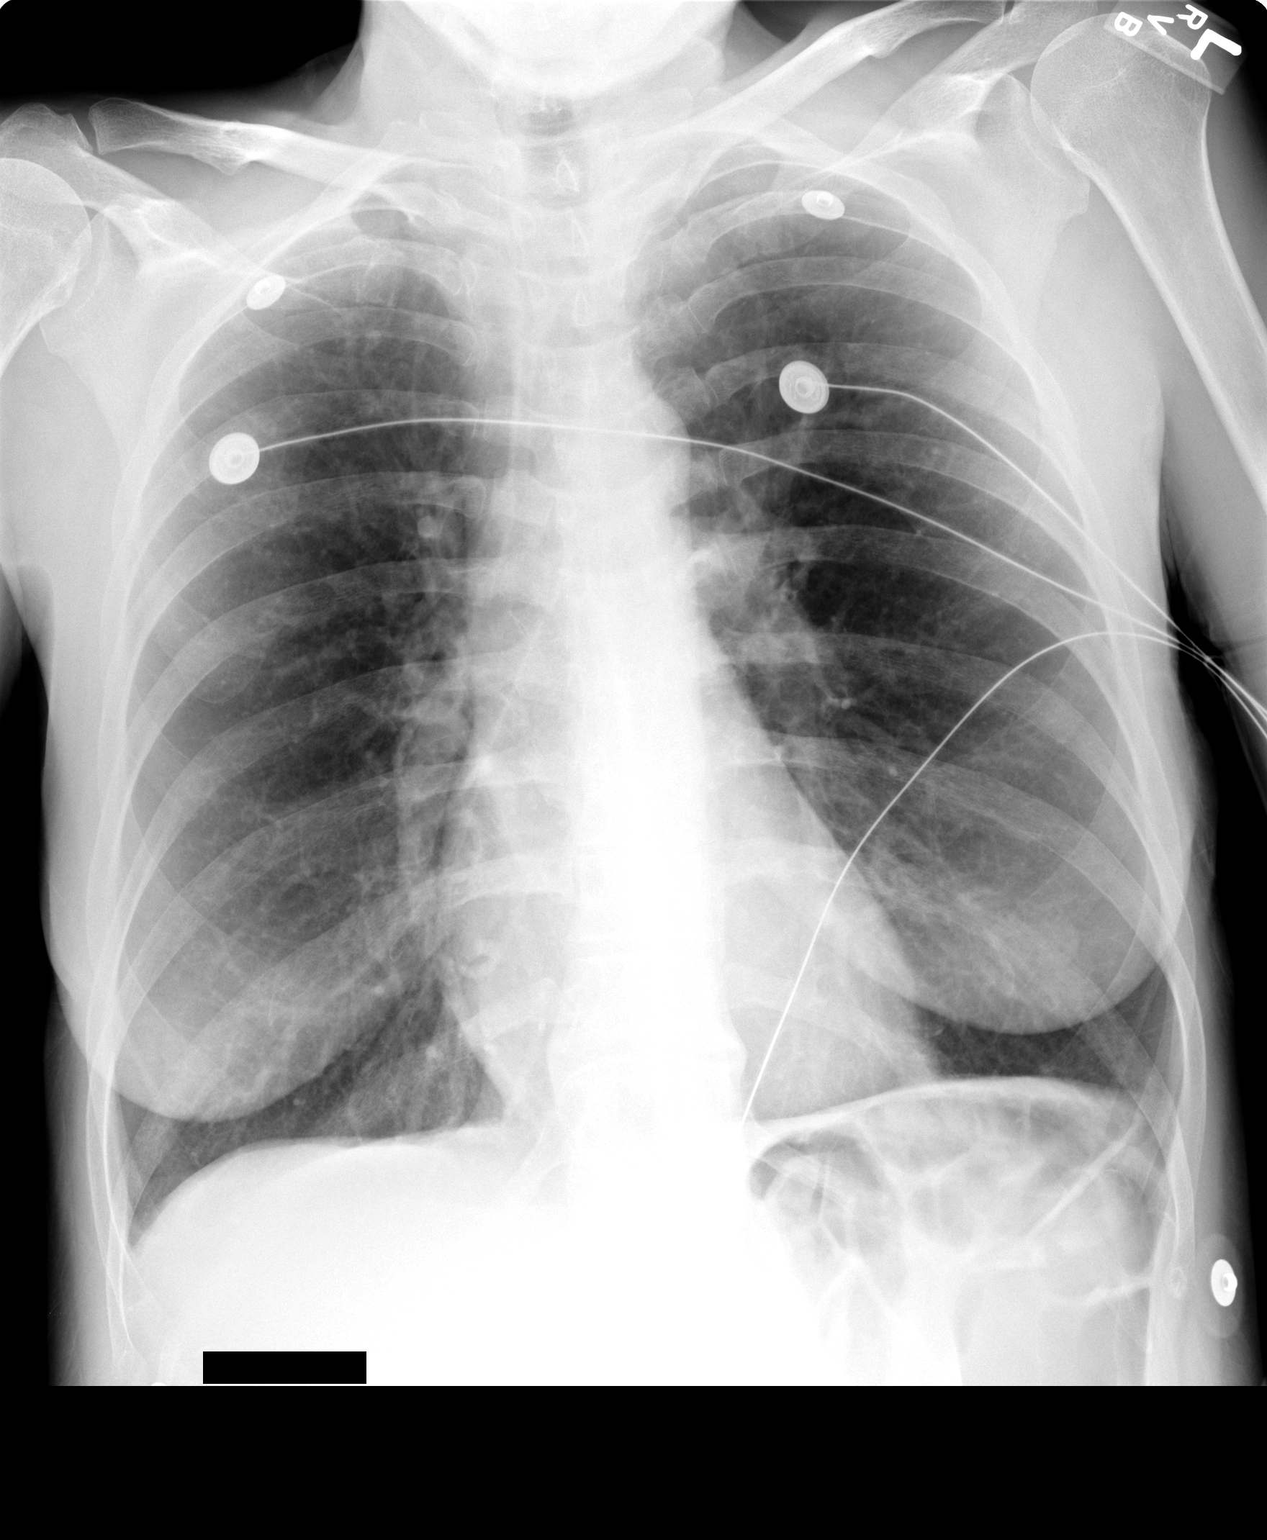

[1 of 1 positions shown; findings below may reference images not displayed]

## 2006-05-21 IMAGING — CT CT HEAD W/O CM
2 of 3 series · 14 of 33 positions shown, 18 images · non-contrast
Comparison: none

CLINICAL DATA: Seizure, previous cranial surgery eight years ago for hemorrhage.
 CT HEAD WITHOUT CONTRAST
 Routine non contrast CT head compared to 02/01/03.  Normal ventricular morphology.  Prior posterior right parietal craniotomy with large area of encephalomalacia involving the posterior right hemisphere.  This is stable since previous study.  No midline shift, mass effect or developing hydrocephalus.  No intracranial hemorrhage or acute infarct seen.  Beam hardening artifacts, skull base.  Visualized sinuses clear.  Calvarium otherwise unremarkable.  
 IMPRESSION
 Prior right posterior parietal craniotomy and parietal occipital encephalomalacia.  No acute intracranial abnormalities.

[Series 2: — · sagittal · 0.67mm/px · 1 of 3 slices shown (1 of 2)]
[im 2/3  brain]
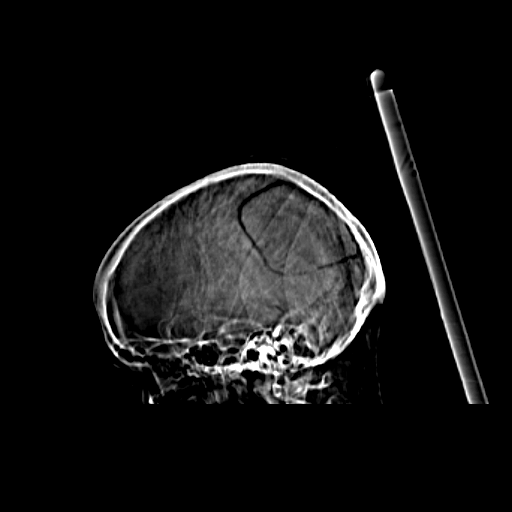

[Series 3: — · axial · 0.43mm/px · z∈[+1142,+1262]mm · 13 of 30 slices shown, 17 images (2 of 2)]
[im 3/30  brain]
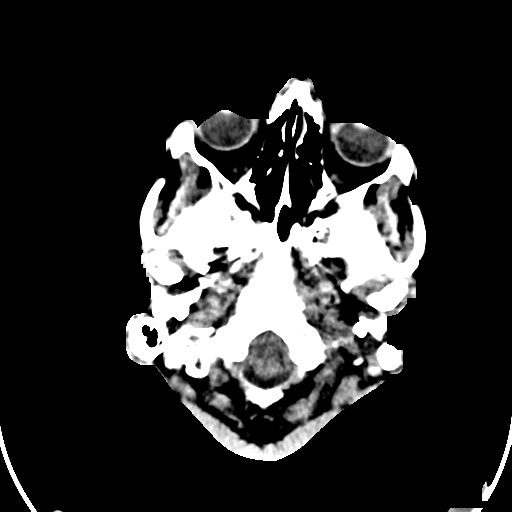
[im 3/30  bone]
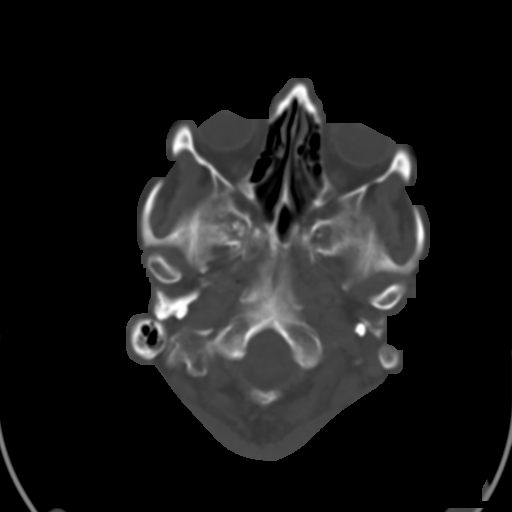
[im 5/30  brain]
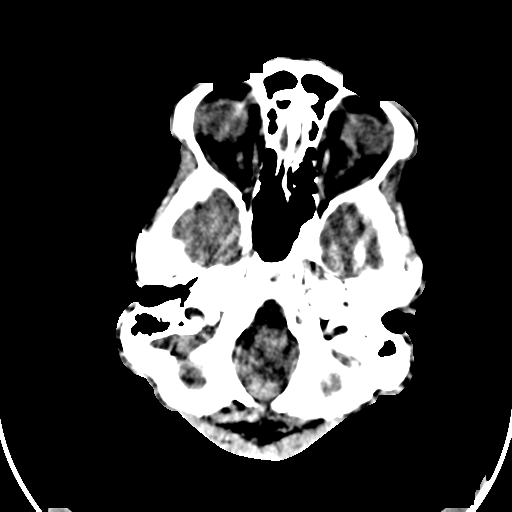
[im 7/30  brain]
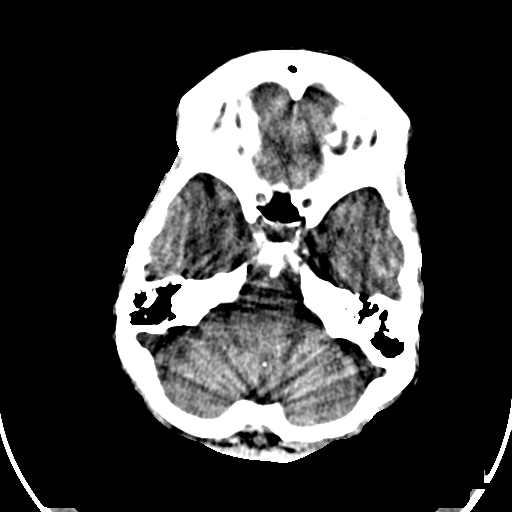
[im 9/30  brain]
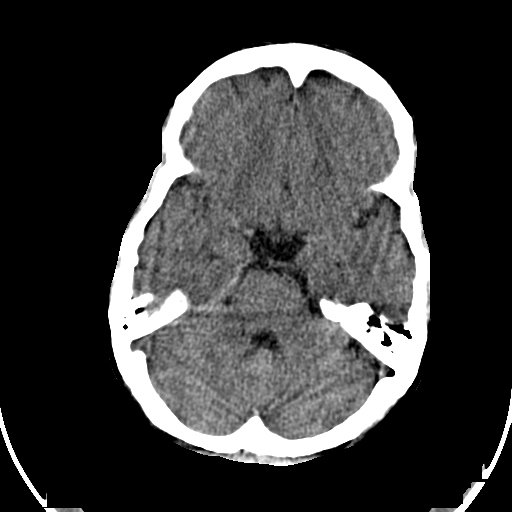
[im 11/30  brain]
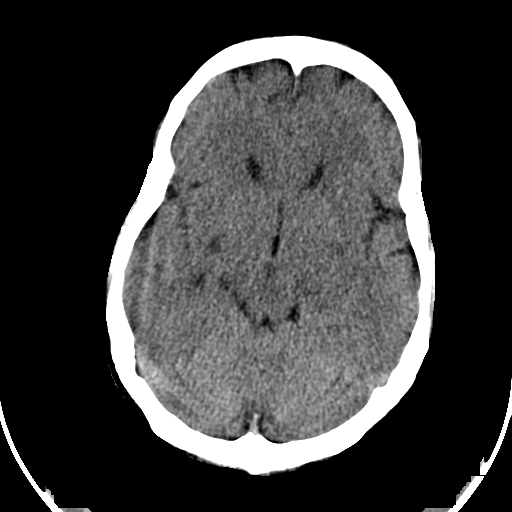
[im 11/30  bone]
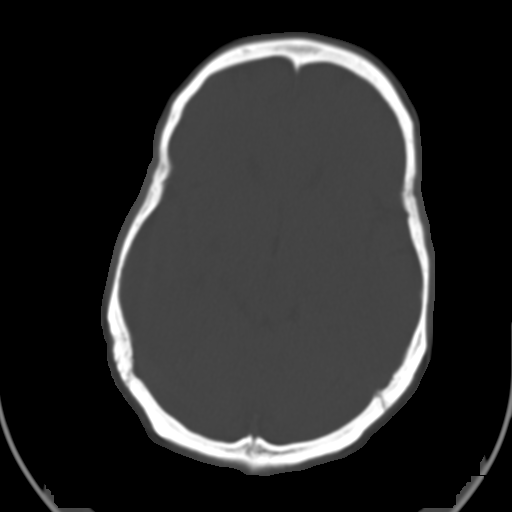
[im 13/30  brain]
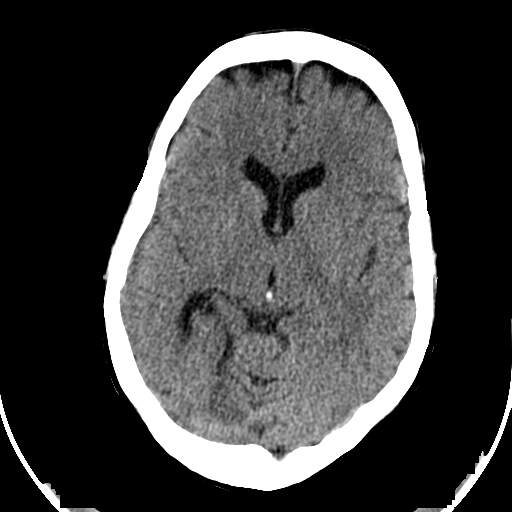
[im 15/30  brain]
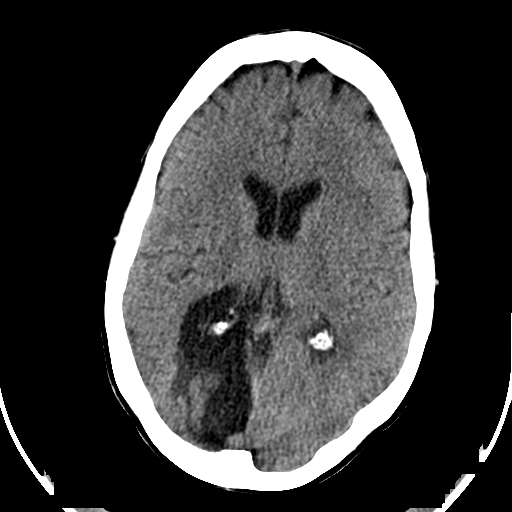
[im 17/30  brain]
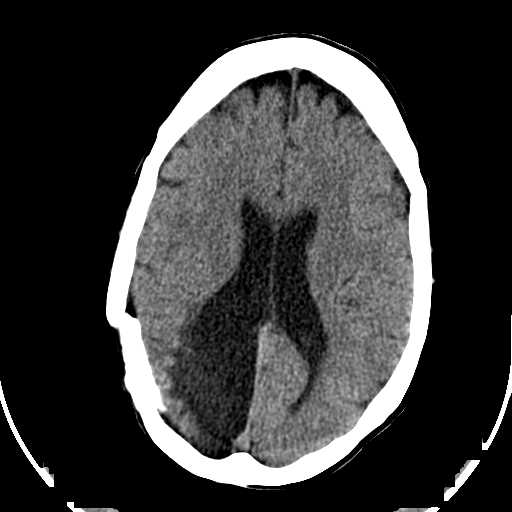
[im 19/30  brain]
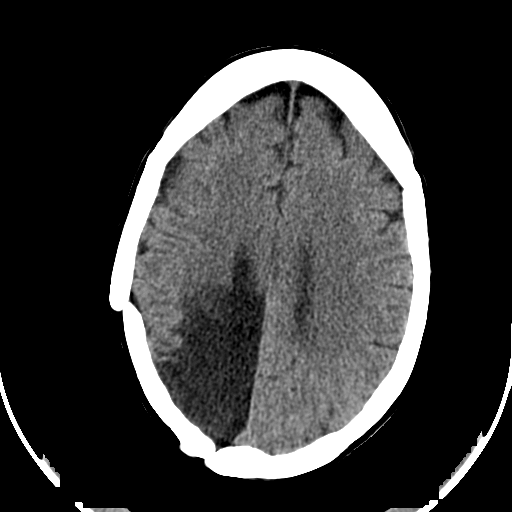
[im 19/30  bone]
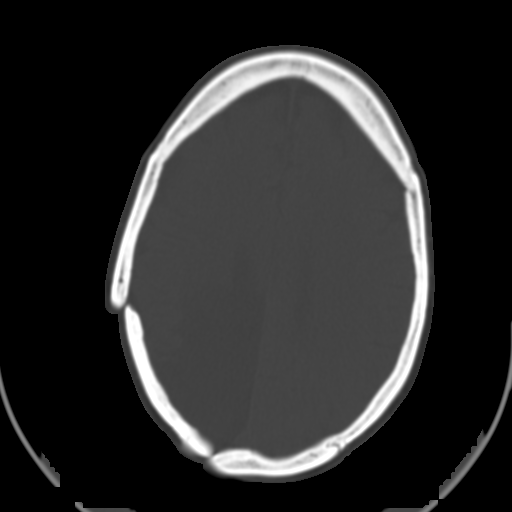
[im 21/30  brain]
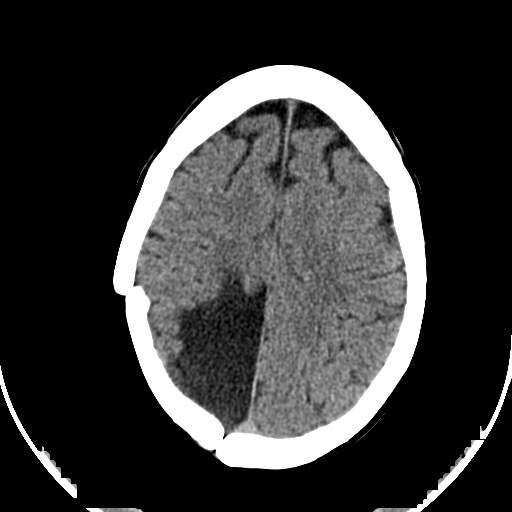
[im 23/30  brain]
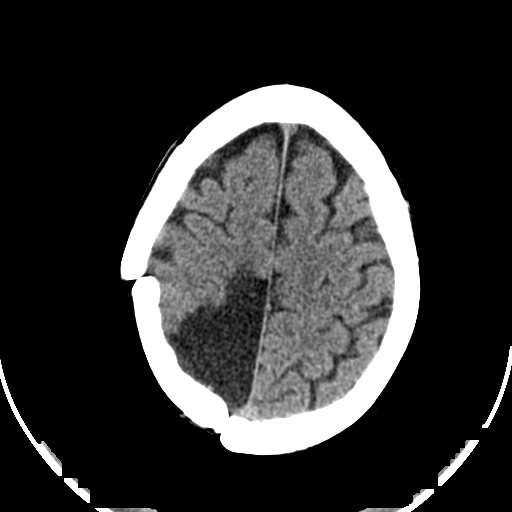
[im 25/30  brain]
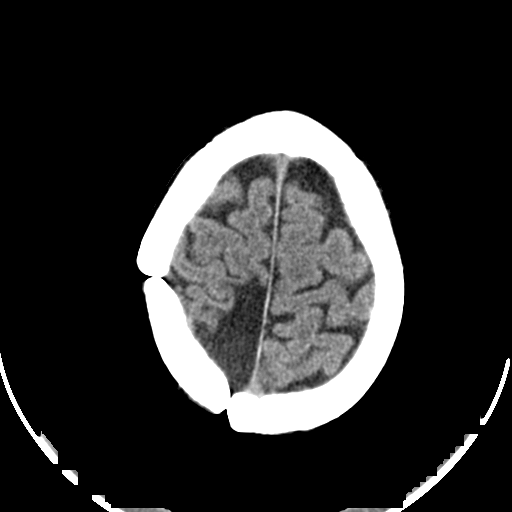
[im 27/30  brain]
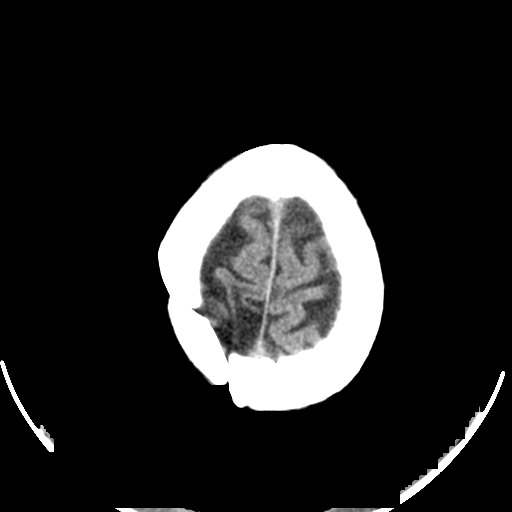
[im 27/30  bone]
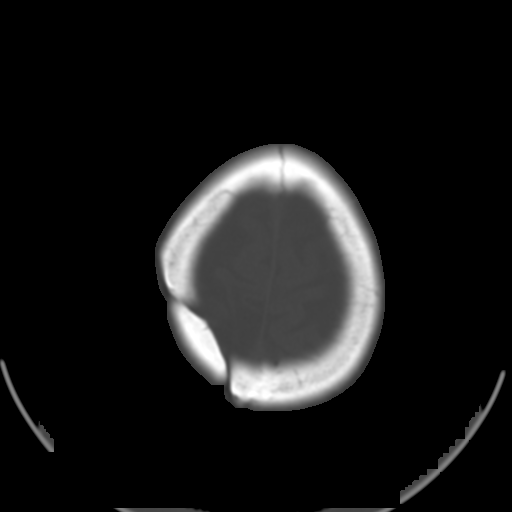

[14 of 33 positions shown; findings below may reference images not displayed]

## 2006-05-22 IMAGING — CR DG CHEST 2V
2 series · 2 of 2 positions shown · non-contrast
Comparison: none

CLINICAL DATA: Seizures; pain rt chest wall; hx of asthma
 TWO VIEW CHEST 
 PA and lateral views of the chest are made 04/11/04 at 9769 hours and are compared to a previous portable study of 03/10/04 and show generalized peribronchial thickening, flattening of the hemidiaphragm, AP diameter increase, and hyperaeration.  The heart and mediastinum are normal.  There is no evidence of active infiltrate or consolidation.  No pneumothorax is seen.  The bones appear to be within the limits of normal. 
 IMPRESSION
 COPD with somewhat greater hyperaeration of the lungs than on the previous study of 03/10/04.  No active infiltrate or congestive heart failure is seen.

[view not recorded (1 of 2)]
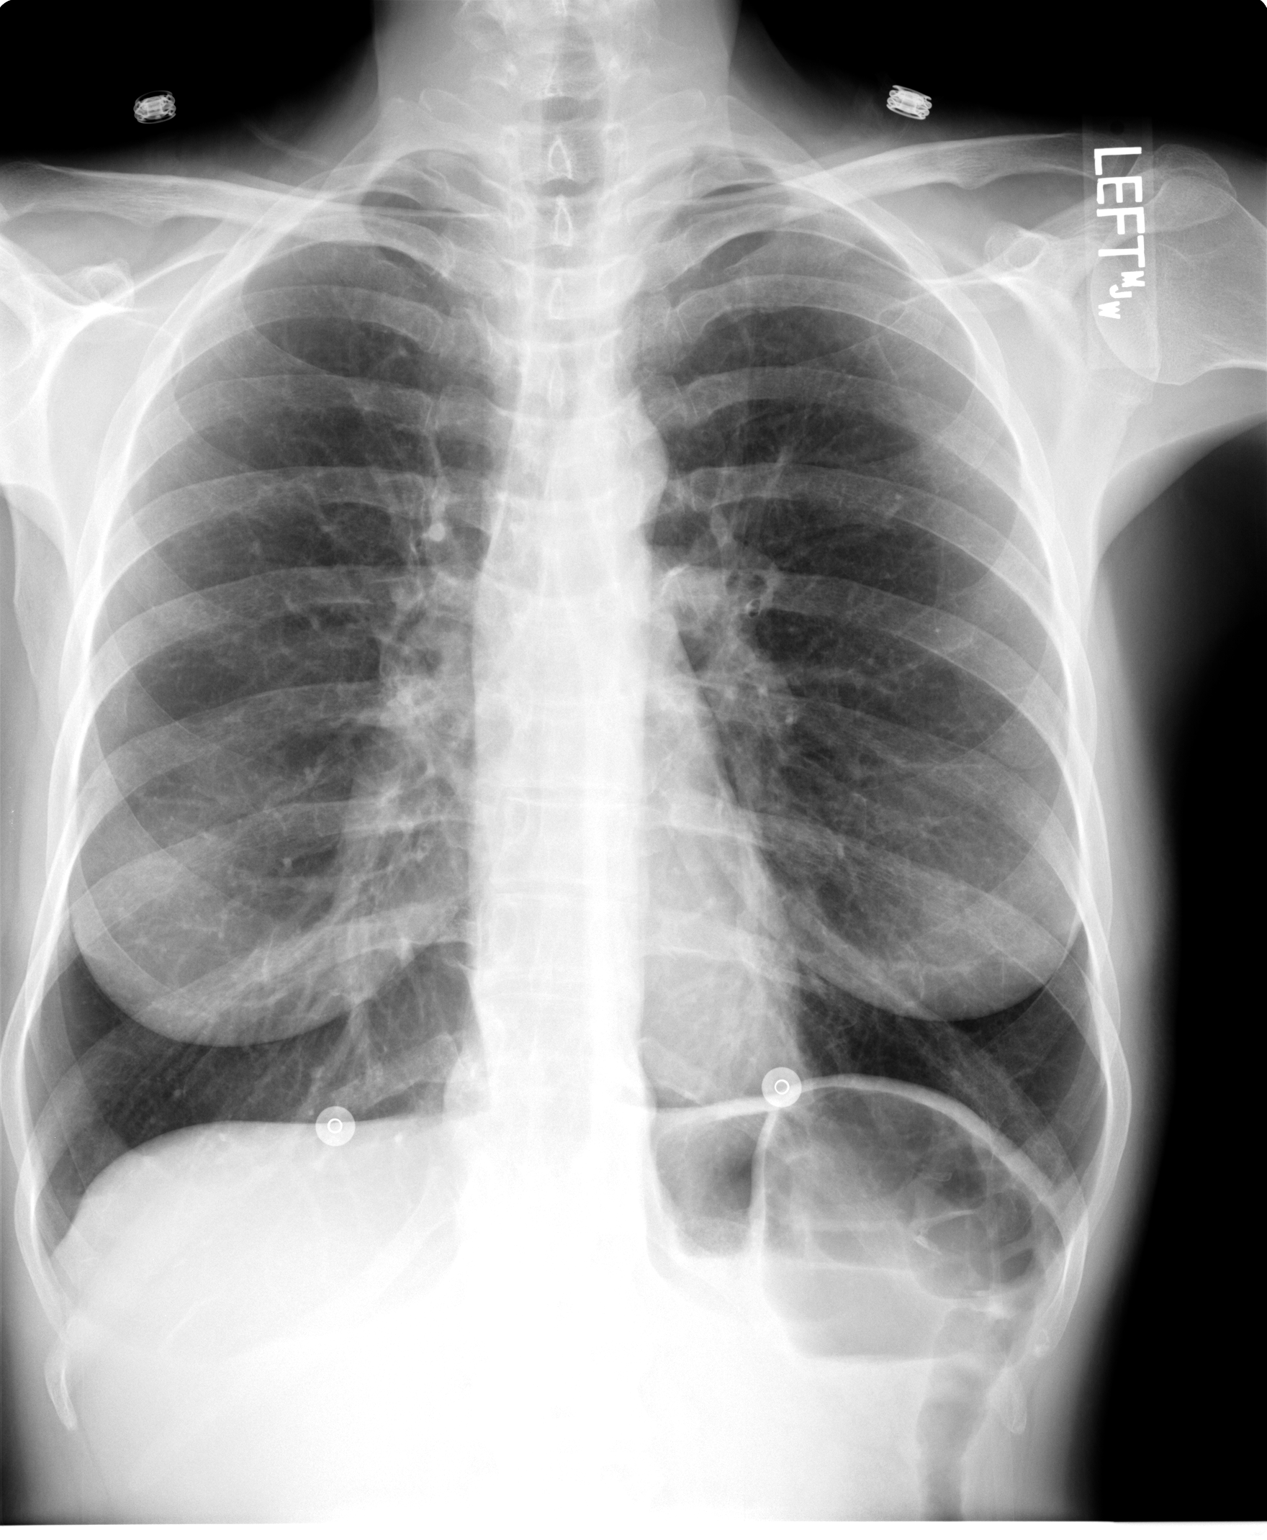

[view not recorded (2 of 2)]
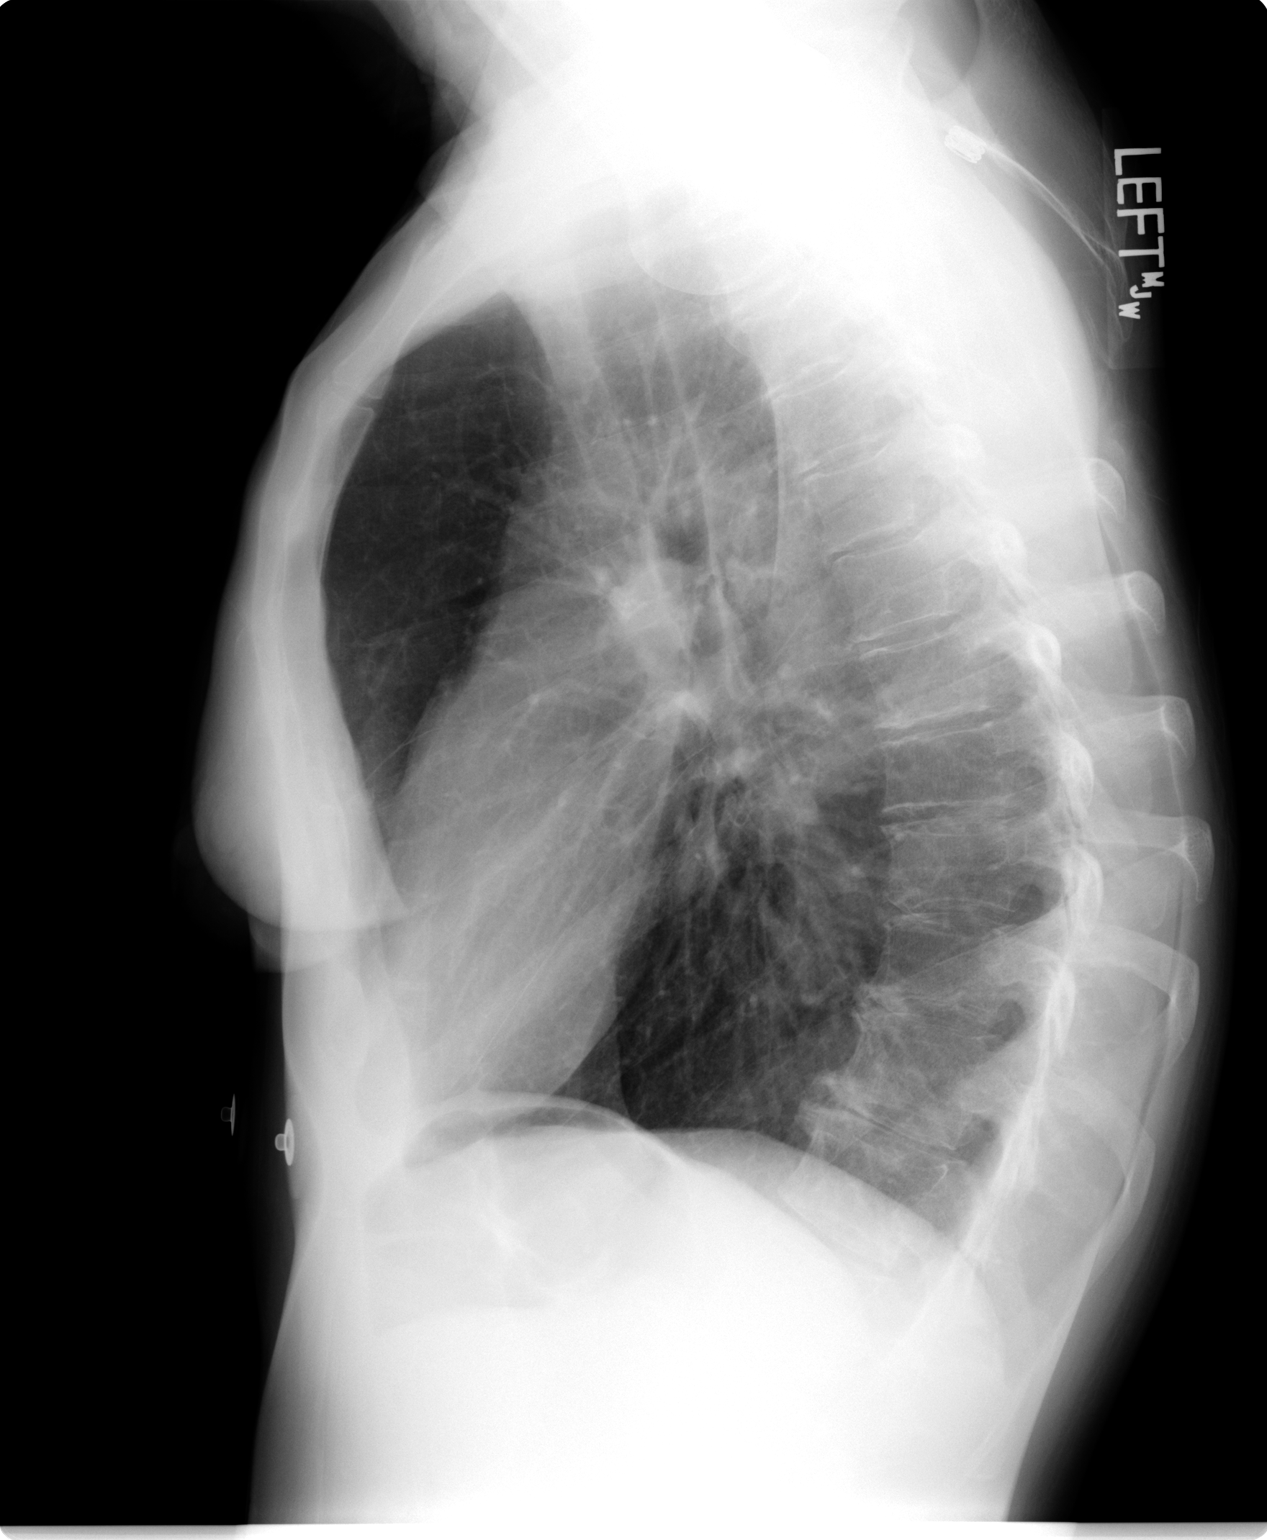

[2 of 2 positions shown; findings below may reference images not displayed]

## 2006-06-14 IMAGING — CR DG CHEST 2V
2 series · 2 of 2 positions shown · non-contrast
Comparison: none

CLINICAL DATA: Pain.
 TWO VIEW CHEST
 There is diffuse hyperinflation.  There are no infiltrates. The heart and mediastinal structures are normal.
 IMPRESSION
 Hyperinflation. No evidence for active chest disease.

[view not recorded (1 of 2)]
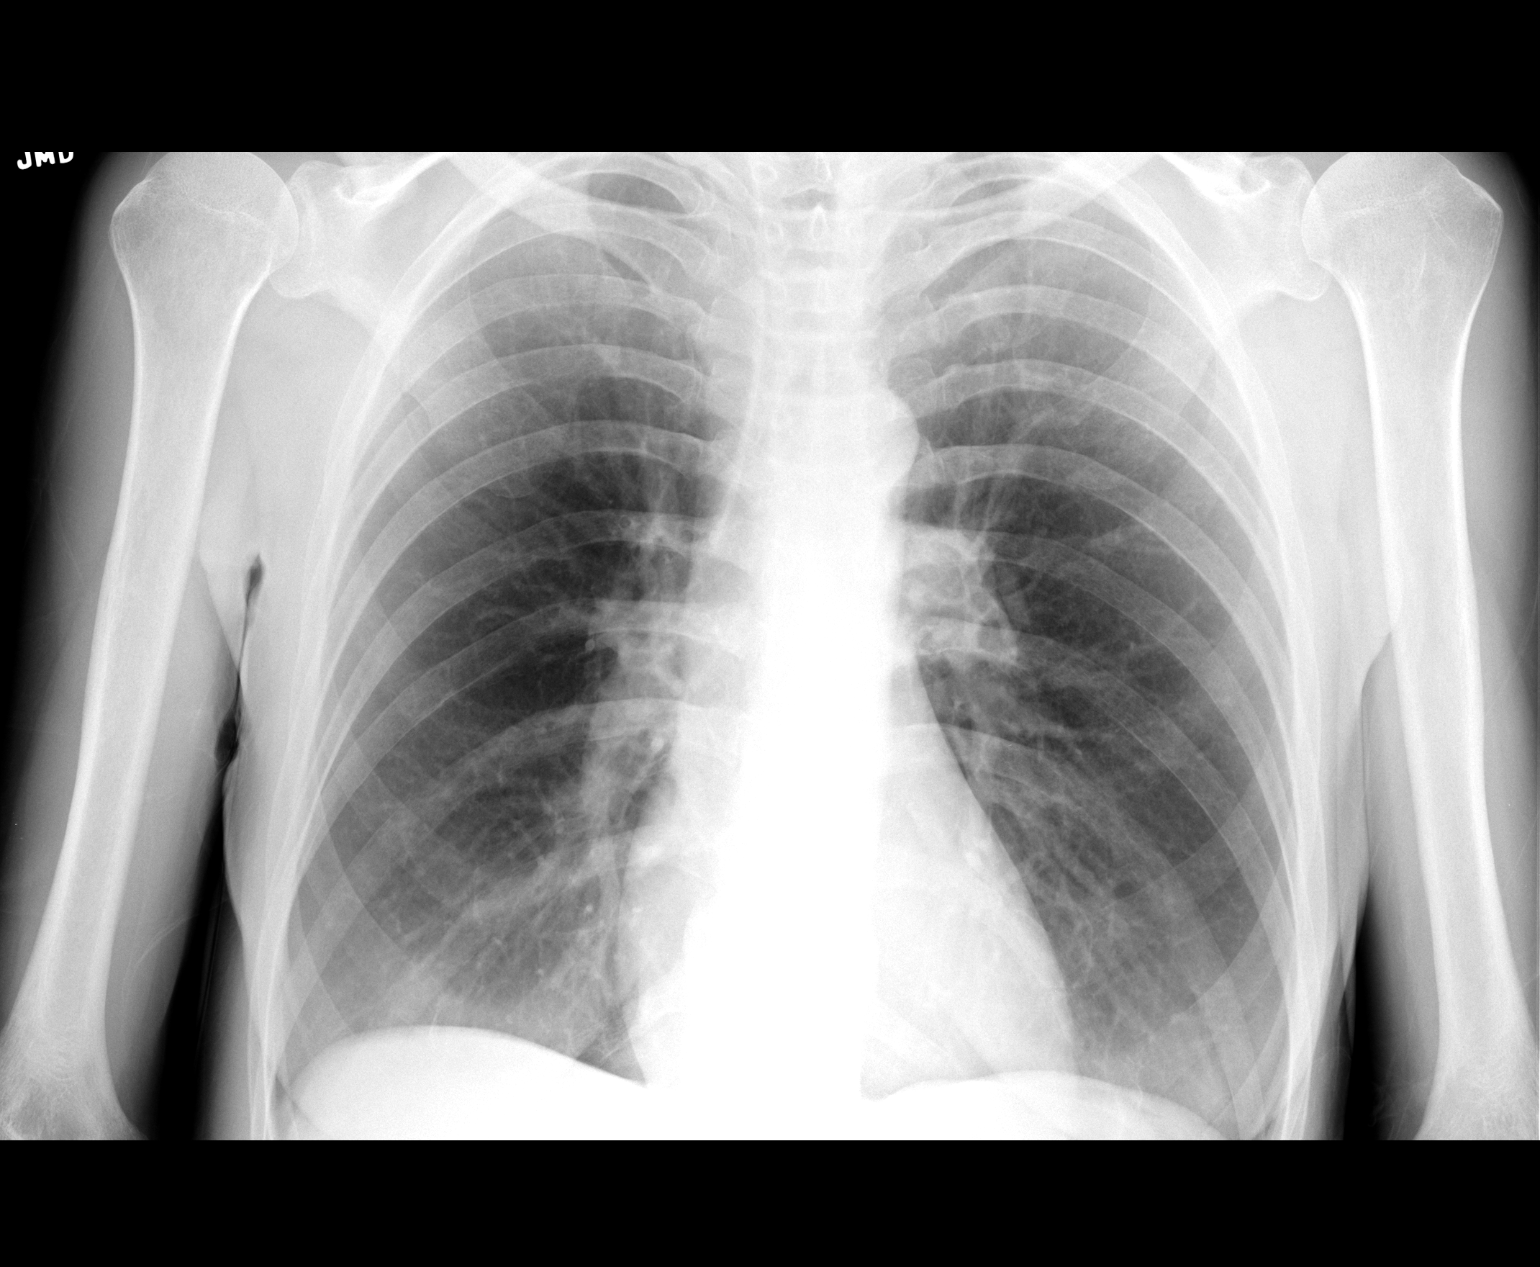

[view not recorded (2 of 2)]
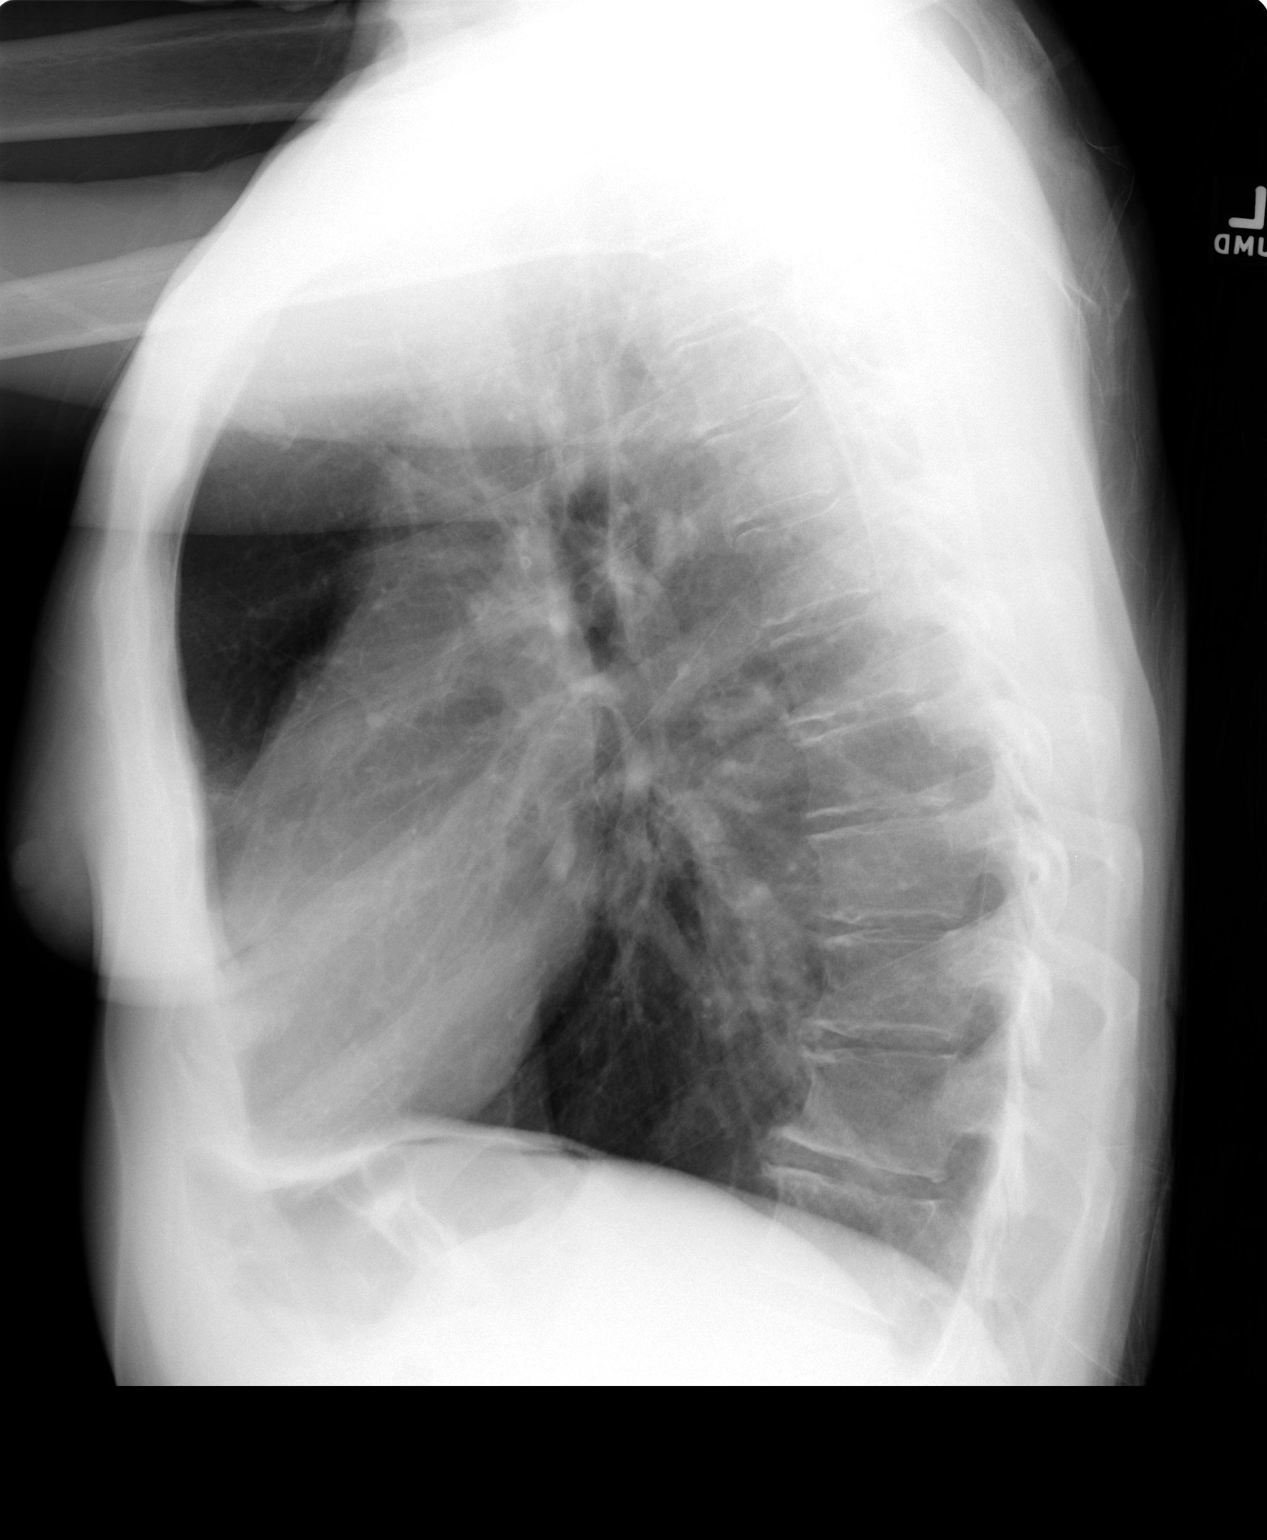

[2 of 2 positions shown; findings below may reference images not displayed]

## 2006-06-14 IMAGING — US US ABDOMEN COMPLETE
1 series · 14 of 25 positions shown · non-contrast
Comparison: none

CLINICAL DATA: Abdominal pain.  
 ULTRASOUND ABDOMEN COMPLETE 
 The gallbladder is adequately visualized and has a normal appearance.  The common bile duct is normal in caliber measuring 3 mm in diameter.  The inferior vena cava is patent.  There are no focal hepatic abnormalities.  The pancreas has a normal appearance.  The tail of the pancreas is not fully visualized due to overlying gas.  The kidneys are somewhat echogenic.  There is a 1 cm in size simple appearing right renal cyst present.  There is slight fullness in the left renal pelvis most likely secondary to an extrarenal pelvis.  The kidneys are normal in size measuring 10.8 cm in diameter bilaterally.  The abdominal aorta is normal in size and there is no ascites.  
 IMPRESSION
 1.  Normal appearing gallbladder and common bile duct. 
 2.  1 cm in size simple appearing right renal cyst. 
 3.  Mildly prominent left renal extrarenal pelvis.

[Series 1: unknown · 0.33mm/px · 14 of 91 slices shown]
[im 1/91]
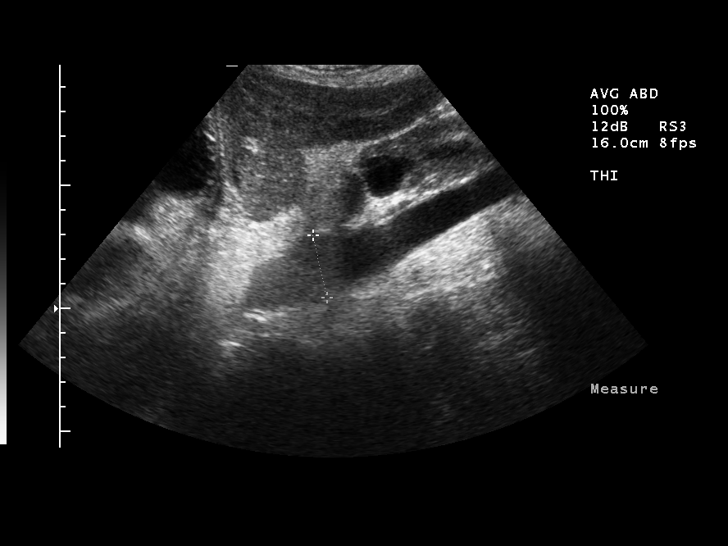
[im 8/91]
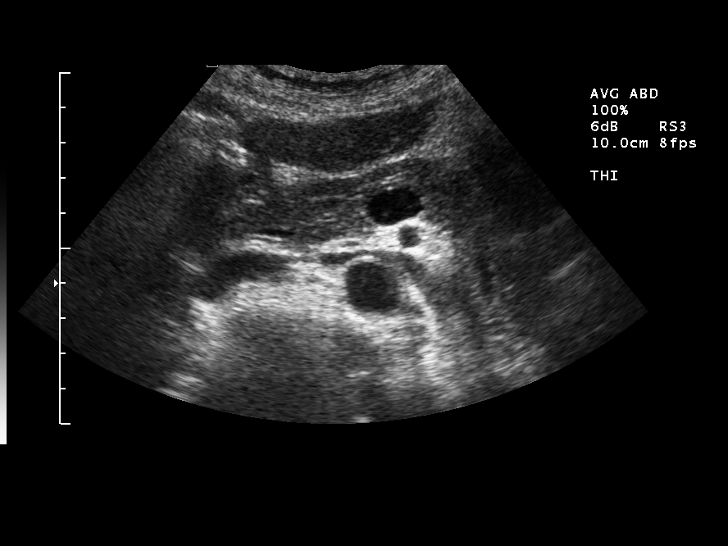
[im 16/91]
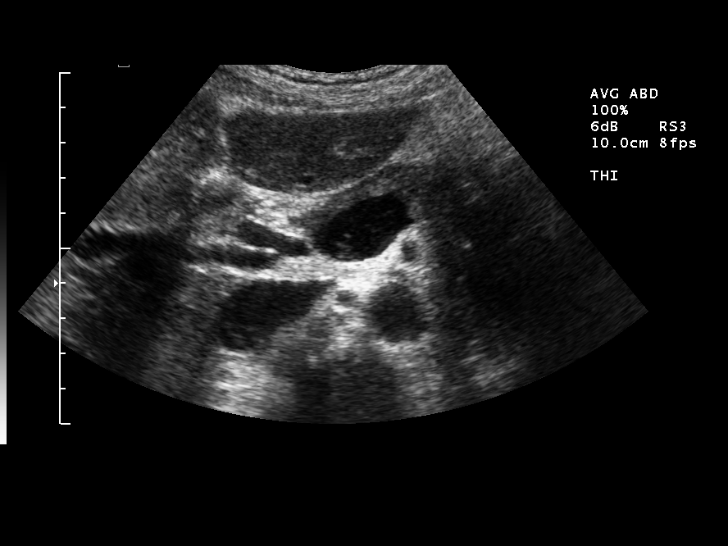
[im 23/91]
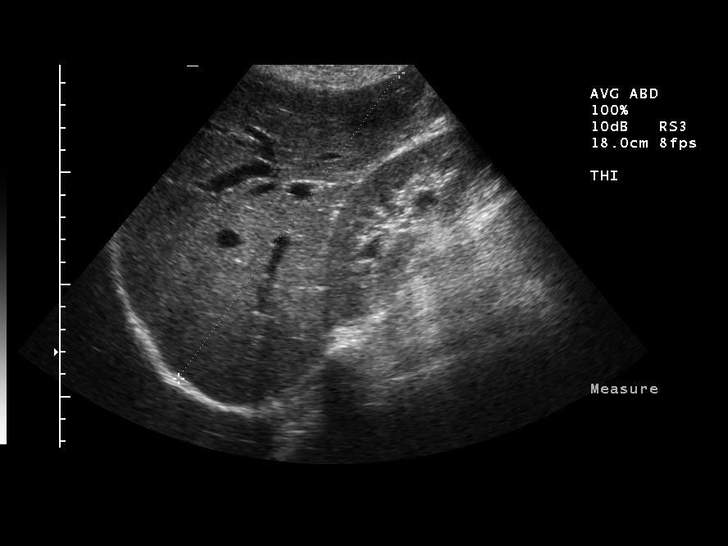
[im 31/91]
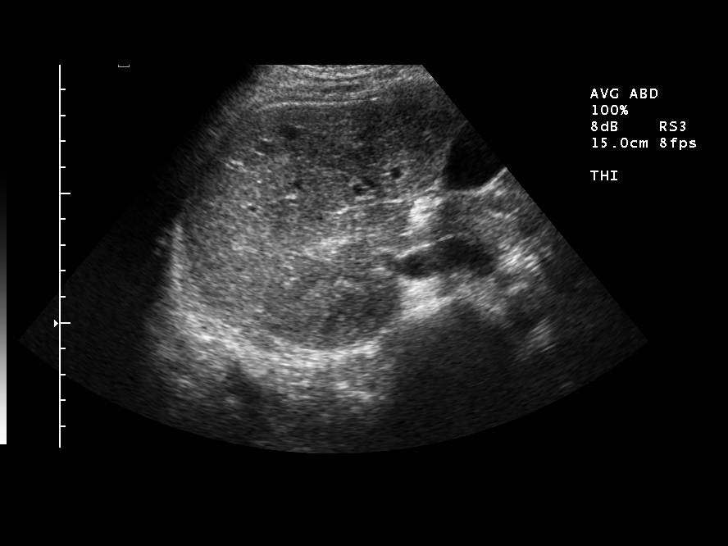
[im 34/91]
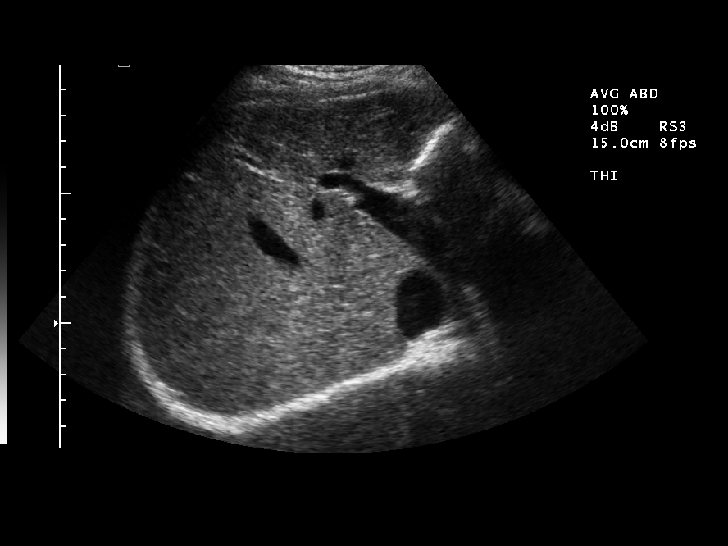
[im 42/91]
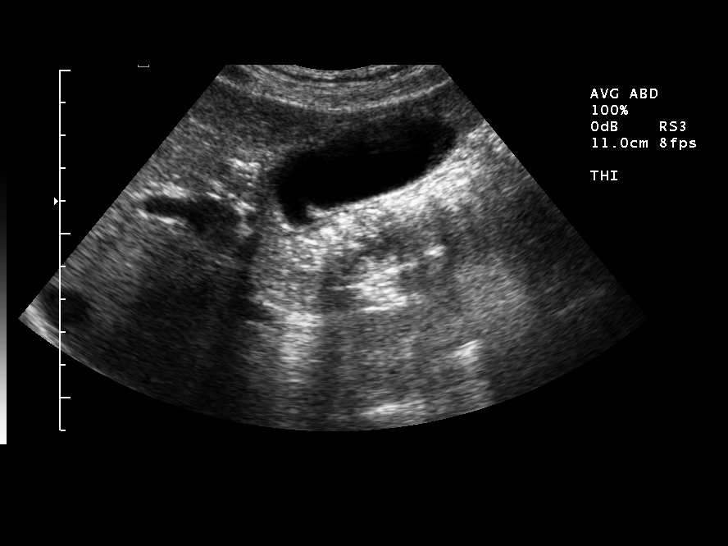
[im 49/91]
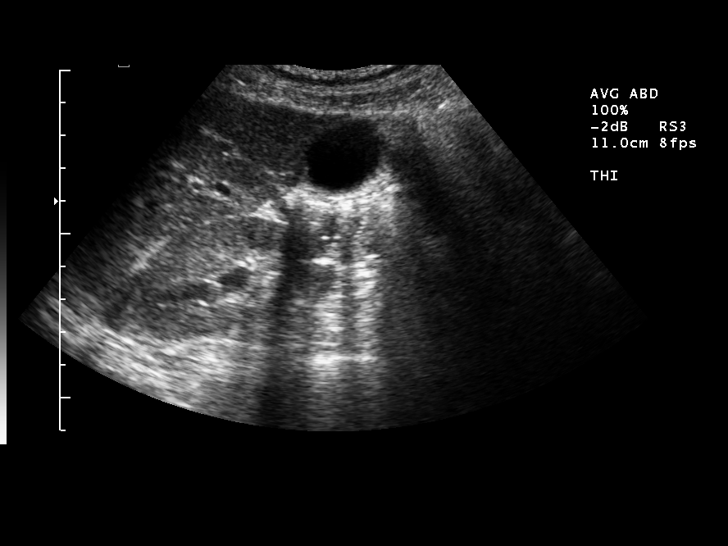
[im 57/91]
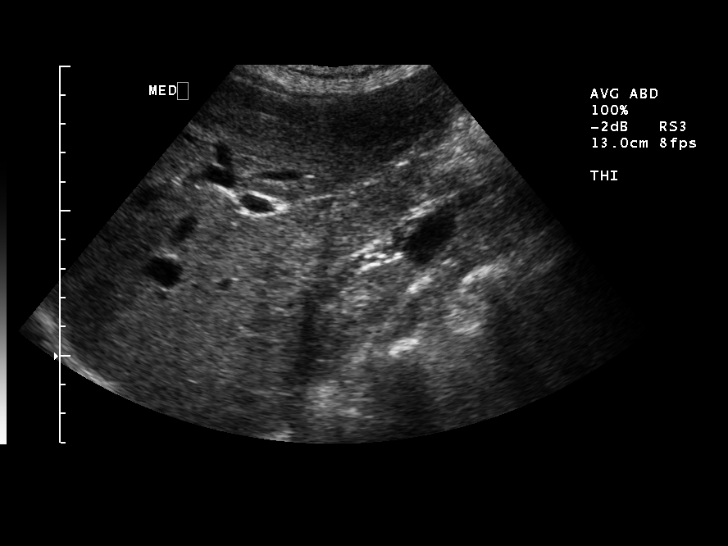
[im 61/91]
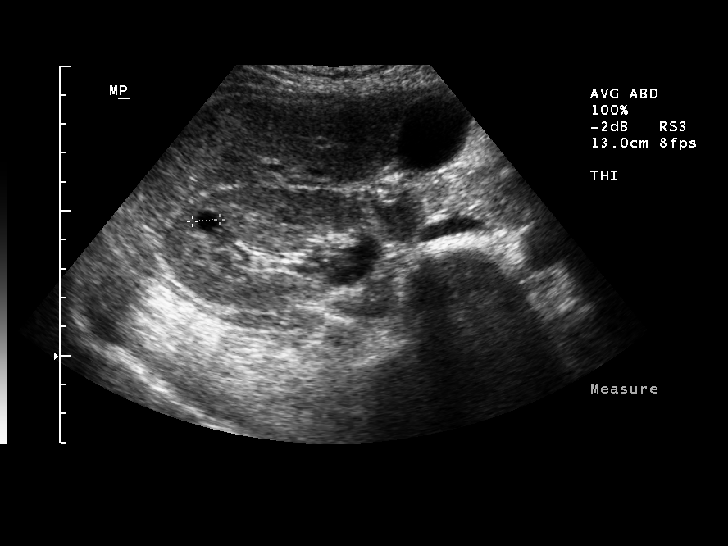
[im 68/91]
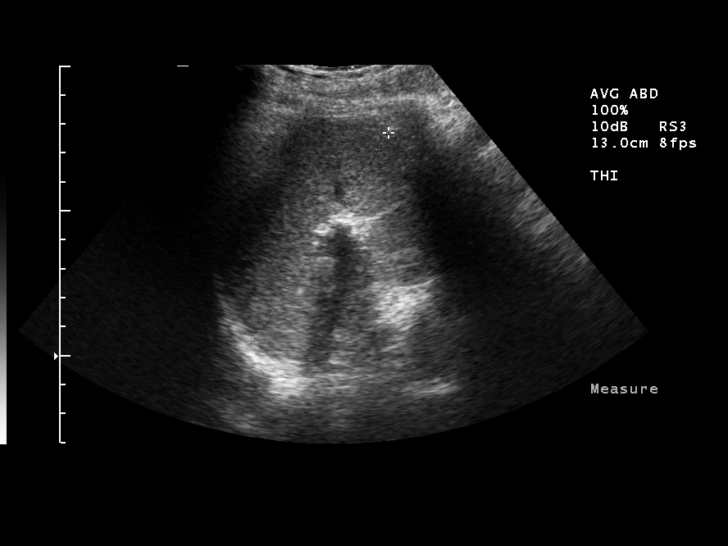
[im 76/91]
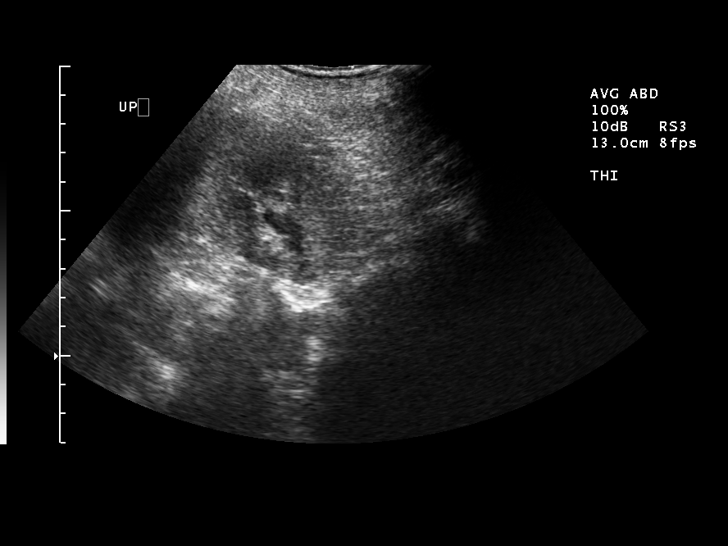
[im 83/91]
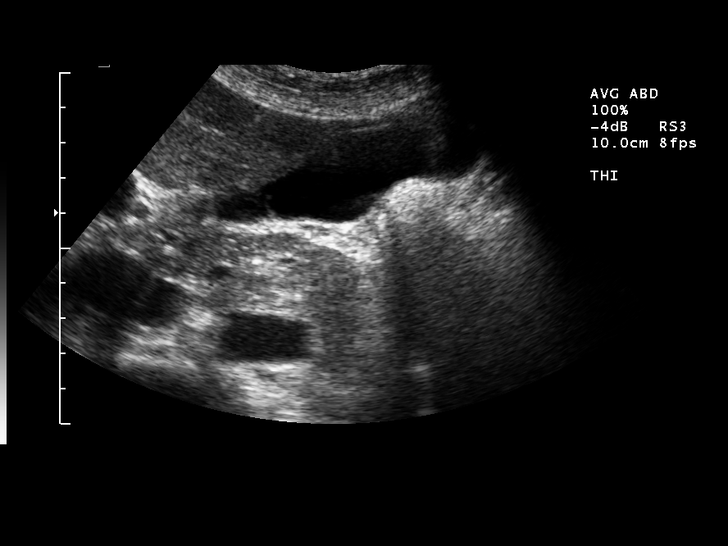
[im 91/91]
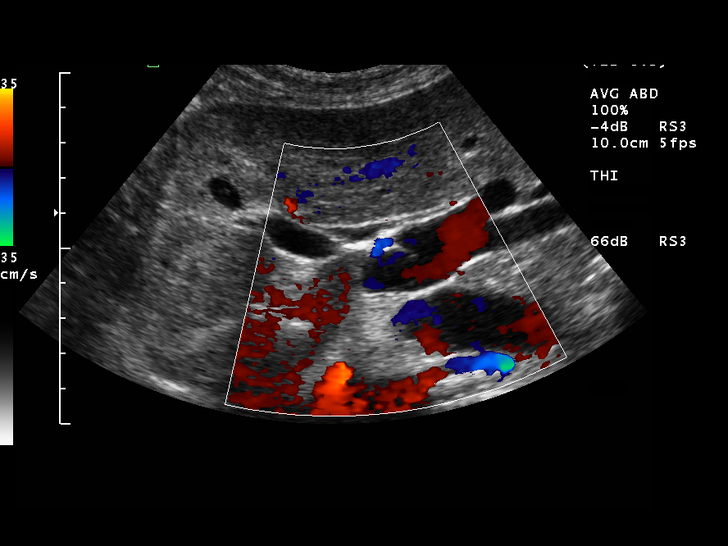

[14 of 25 positions shown; findings below may reference images not displayed]

## 2006-06-17 IMAGING — CR DG RIBS 2V*R*
3 series · 3 of 3 positions shown · non-contrast
Comparison: 05/04/04.

CLINICAL DATA: 48-year-old female, with right upper quadrant pain.  Status-post fall. 
 RIGHT RIBS THREE VIEWS INCLUDING CHEST RADIOGRAPH

[view not recorded (1 of 3)]
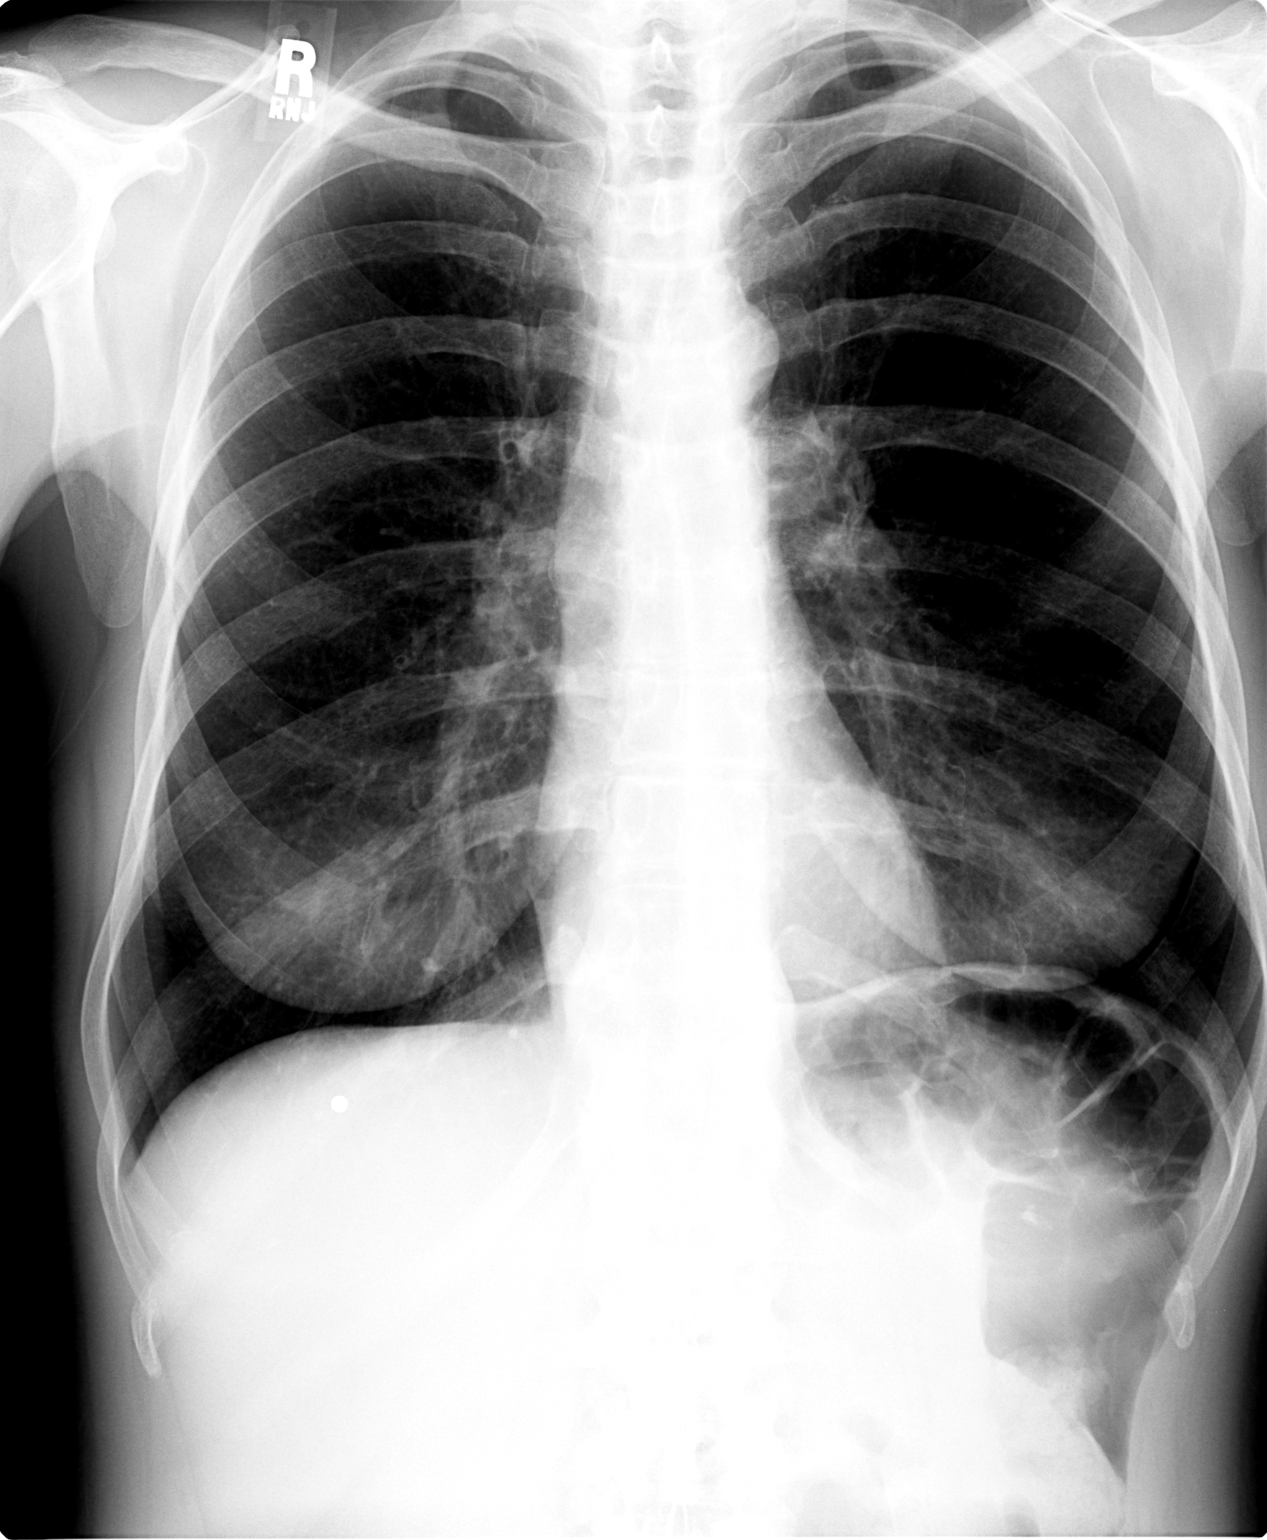

[view not recorded (2 of 3)]
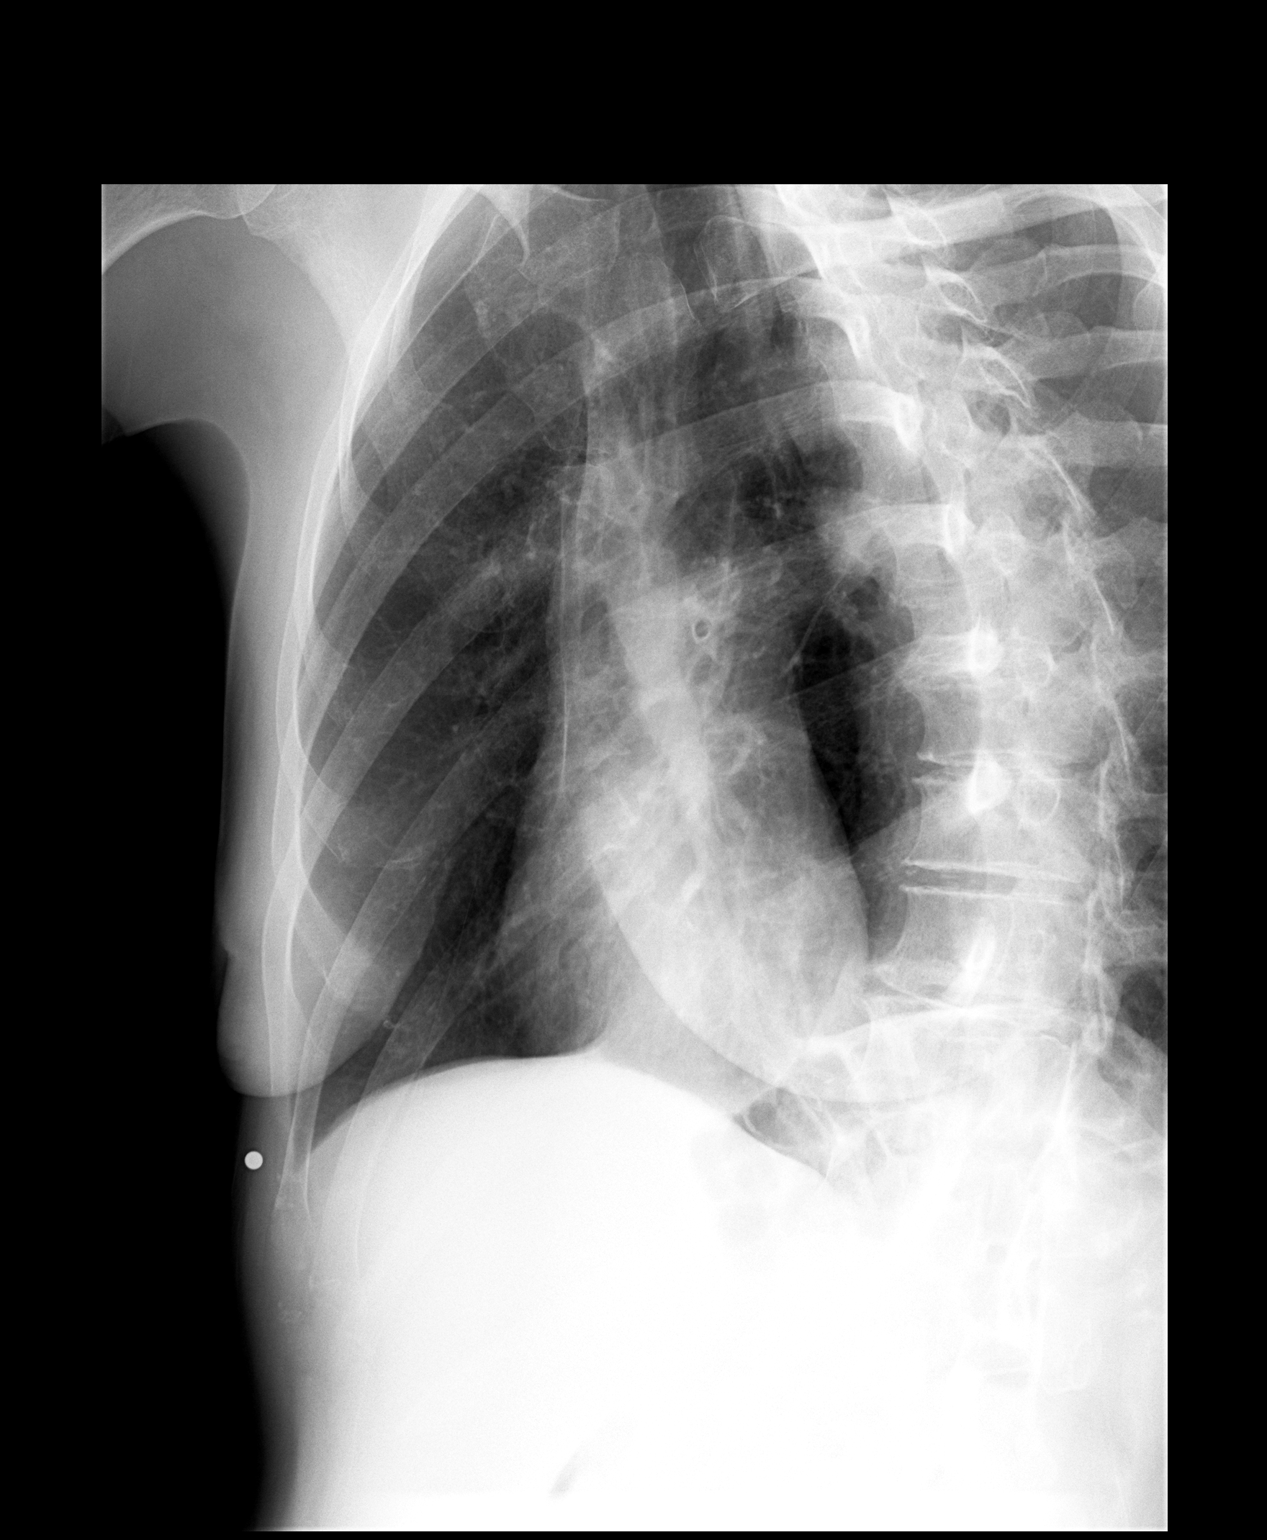

[view not recorded (3 of 3)]
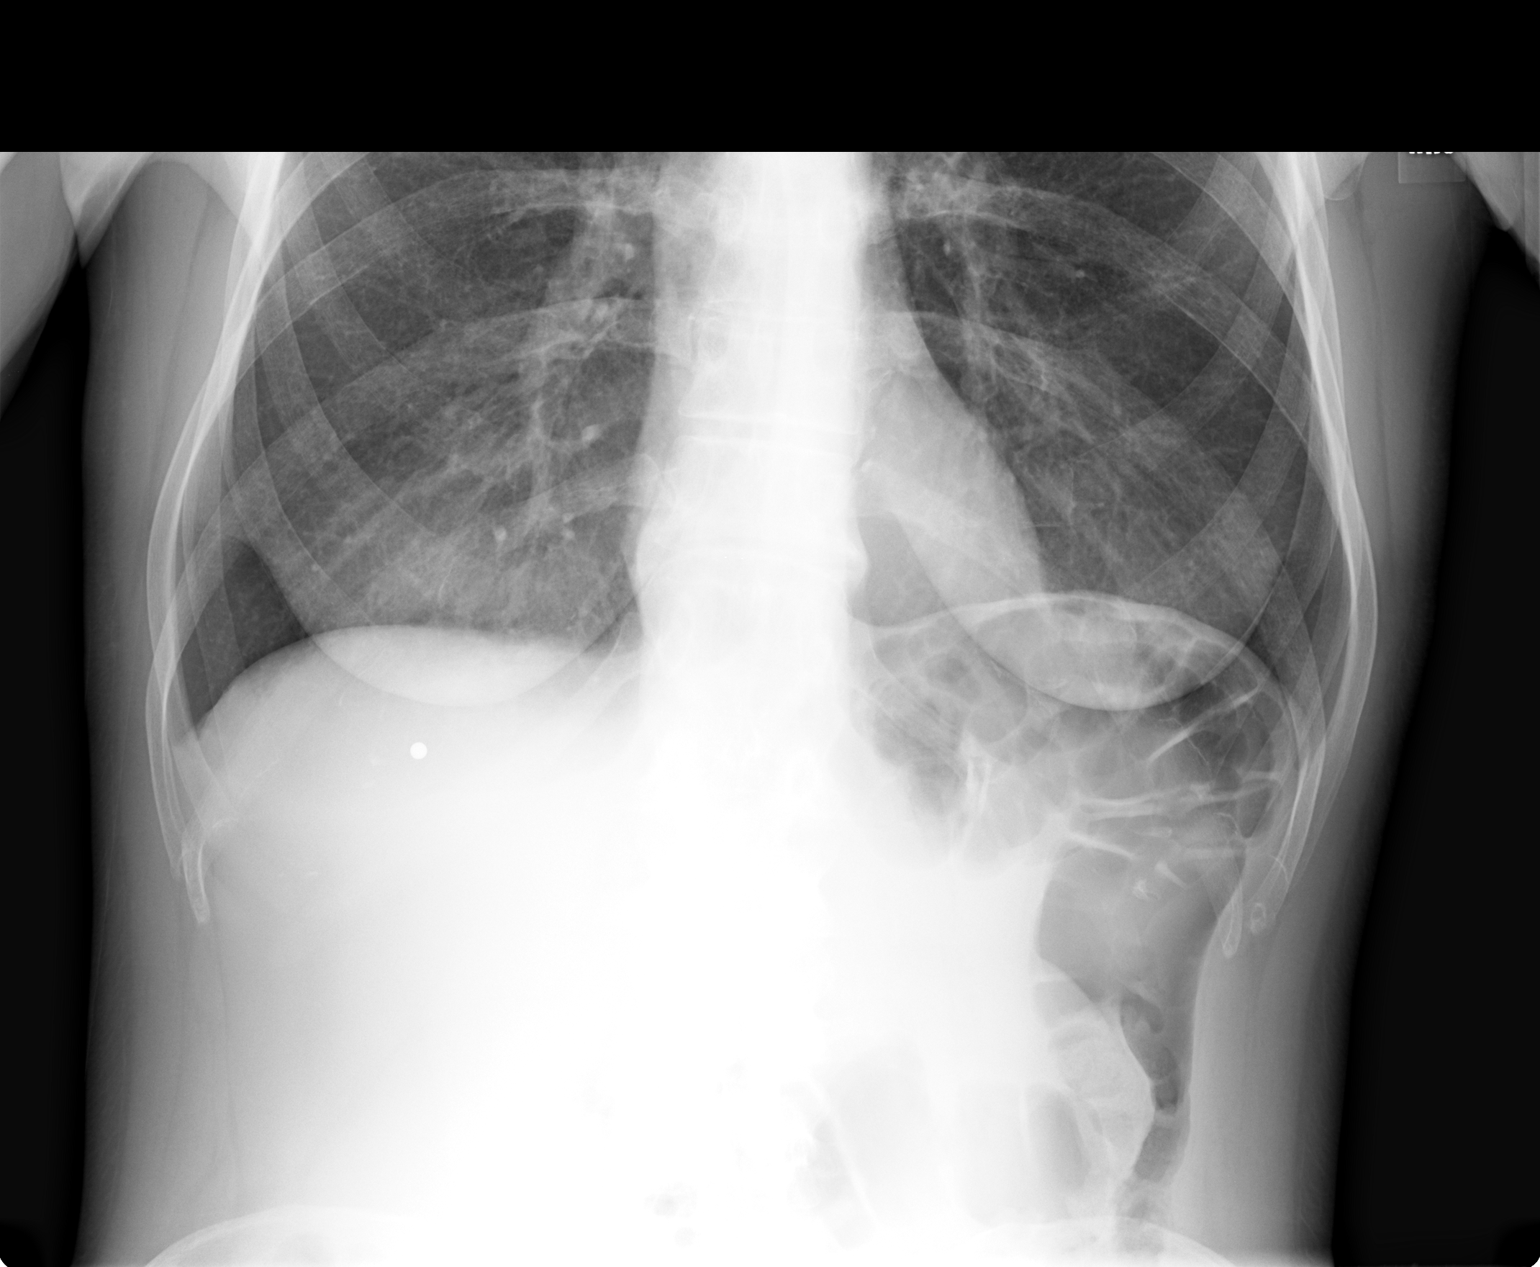

[3 of 3 positions shown; findings below may reference images not displayed]

FINDINGS: CHEST 
 Mild hyperinflation.  No acute air space disease, effusion, edema, CHF, or pneumothorax.  Heart size is normal.  Radiopaque markers noted over the lower right ribs.  No definite rib deformity or displaced fracture. 
 IMPRESSION
 No acute chest disease or definite rib fracture.

## 2006-06-22 IMAGING — CR DG CHEST 2V
2 series · 2 of 2 positions shown · non-contrast
Comparison: 05/07/2004

HISTORY: Severe upper back pain, dyspnea, smoker

CHEST 2 VIEWS

[view not recorded (1 of 2)]
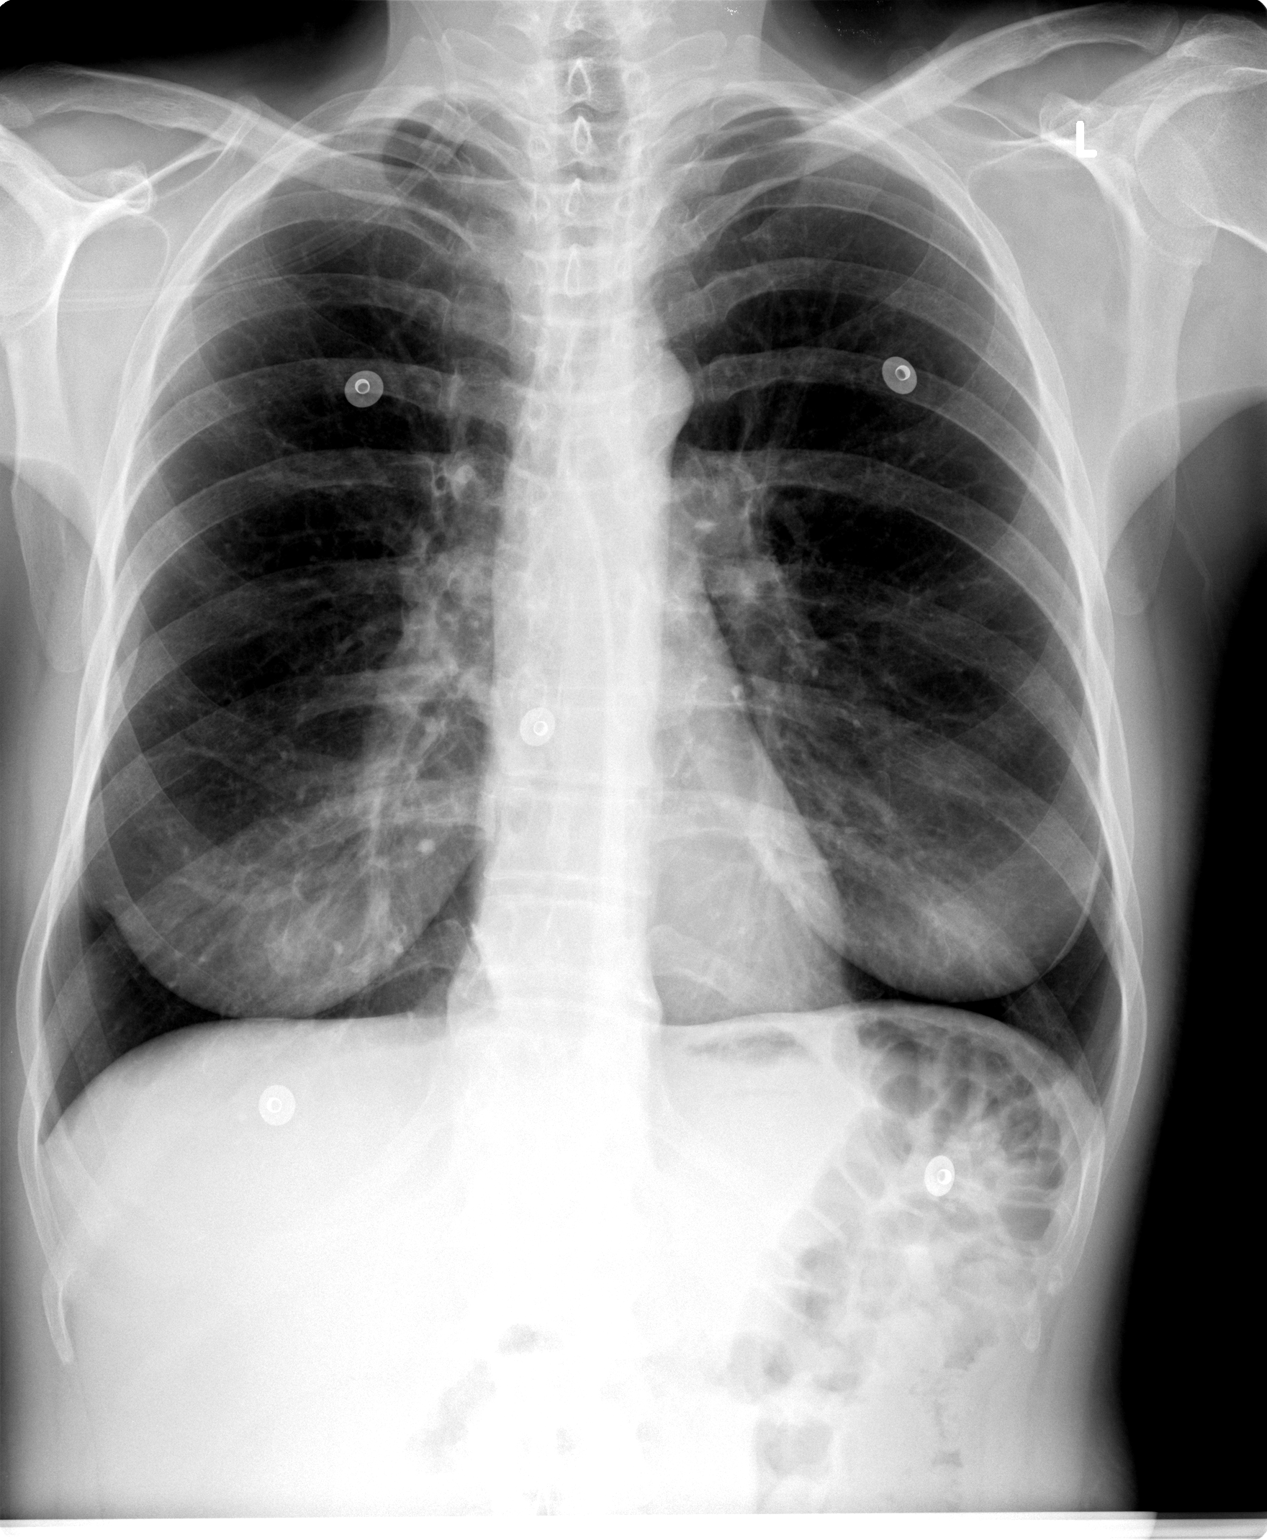

[view not recorded (2 of 2)]
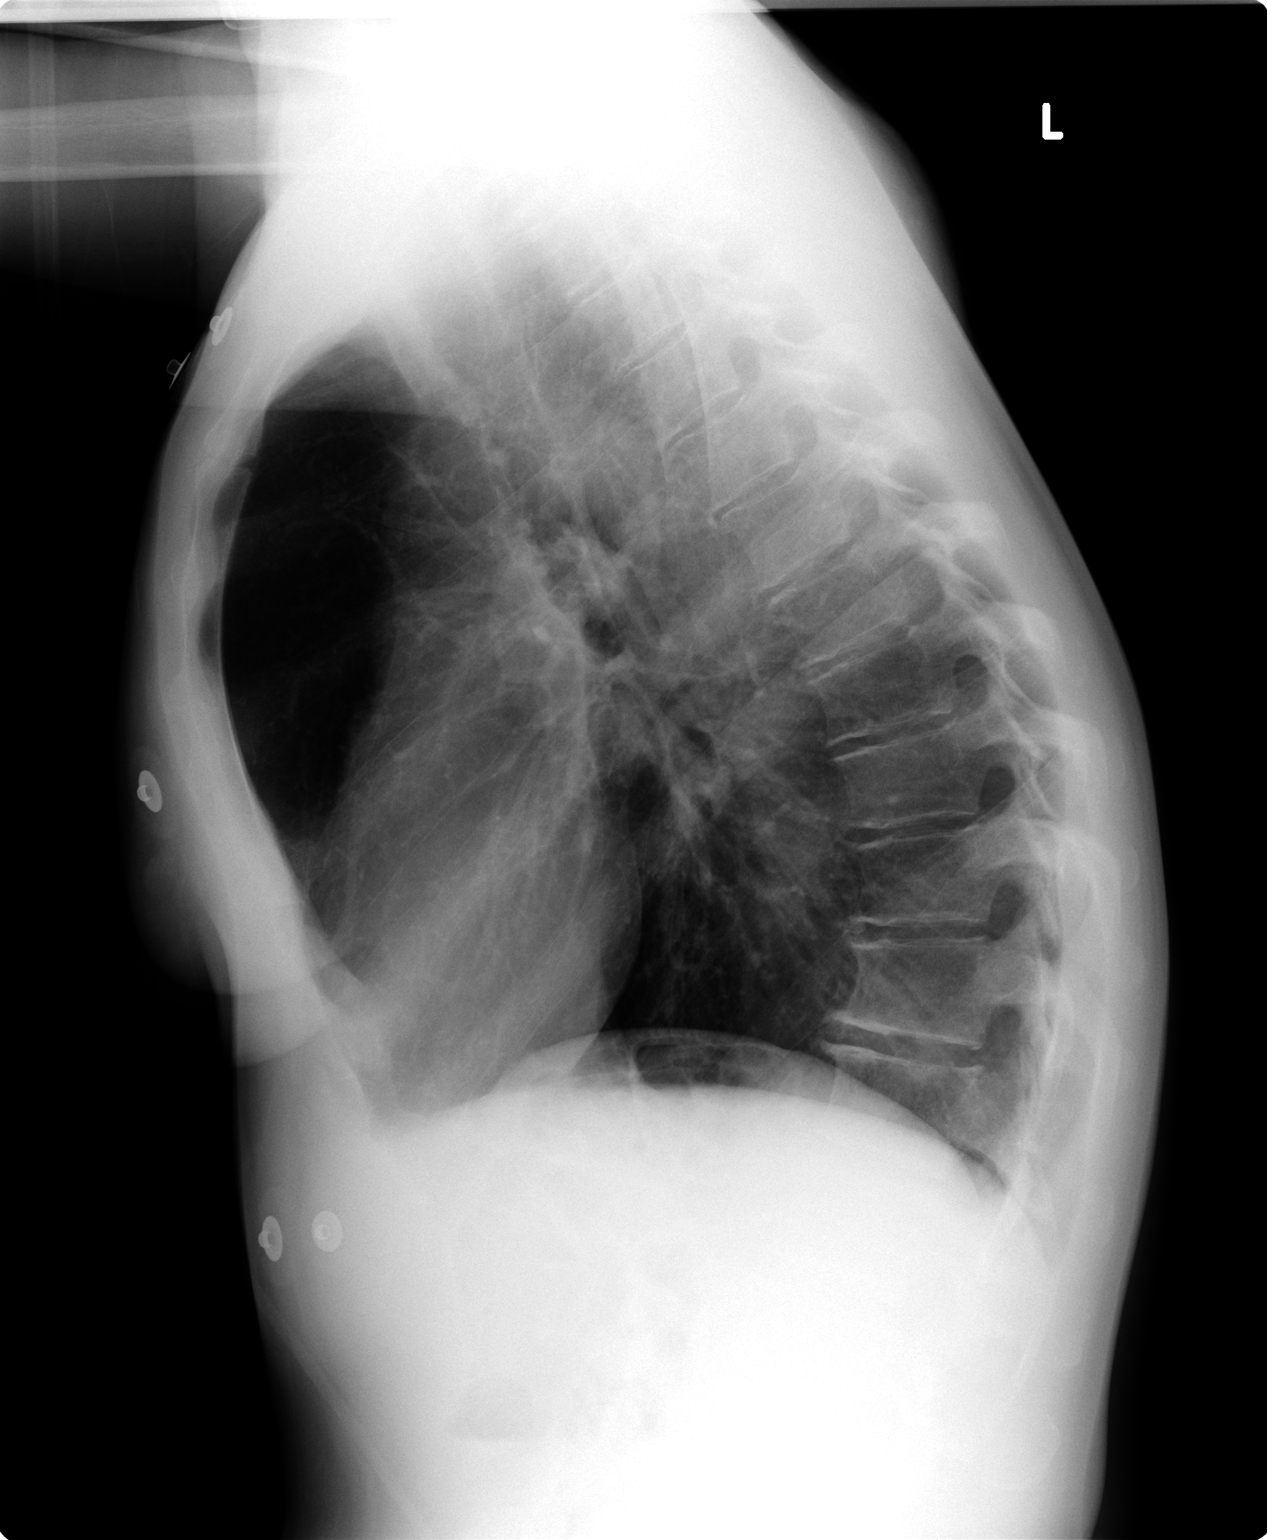

[2 of 2 positions shown; findings below may reference images not displayed]

Normal heart size, mediastinal contours, and vascularity.
Changes of COPD and chronic bronchitis.
No infiltrate, pleural effusion, or pneumothorax.
Bilateral nipple shadows.
No fracture or bone destruction.
IMPRESSION: Changes of COPD and chronic bronchitis without acute abnormality.

## 2006-06-22 IMAGING — CT CT ANGIO CHEST
1 of 3 series · 20 of 32 positions shown · non-contrast
Comparison: none

HISTORY: Upper back pain, question pulmonary embolus

[Series 4: chest/pe 1.0 b10f · axial · 0.65mm/px · z∈[+68,+302]mm · 20 of 517 slices shown]
[im 24/517  lung]
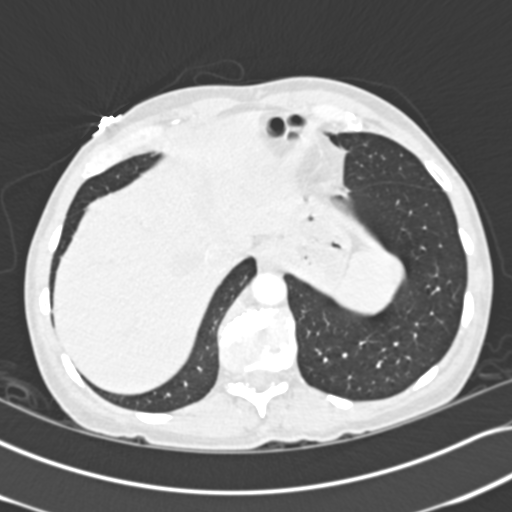
[im 47/517  mediastinal]
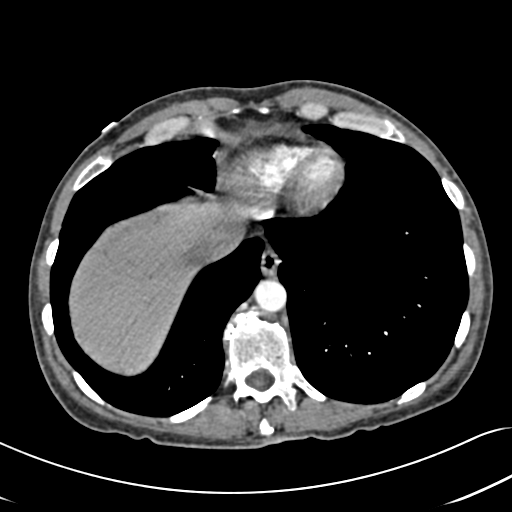
[im 94/517  lung]
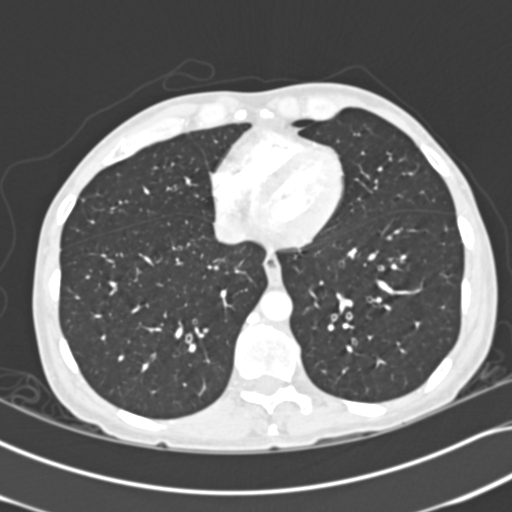
[im 118/517  mediastinal]
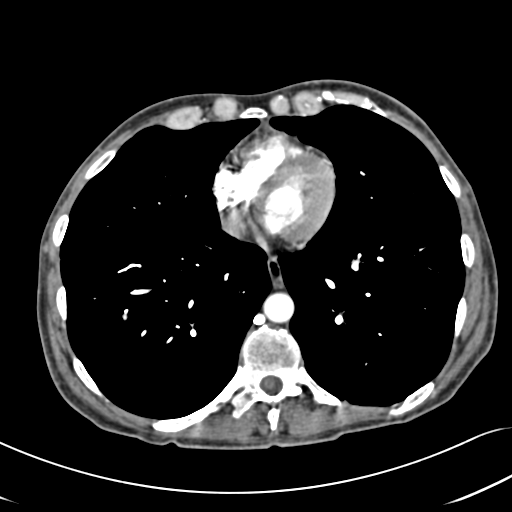
[im 141/517  lung]
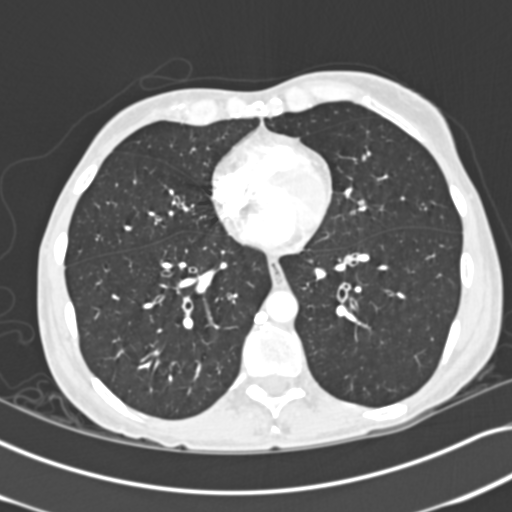
[im 160/517  mediastinal]
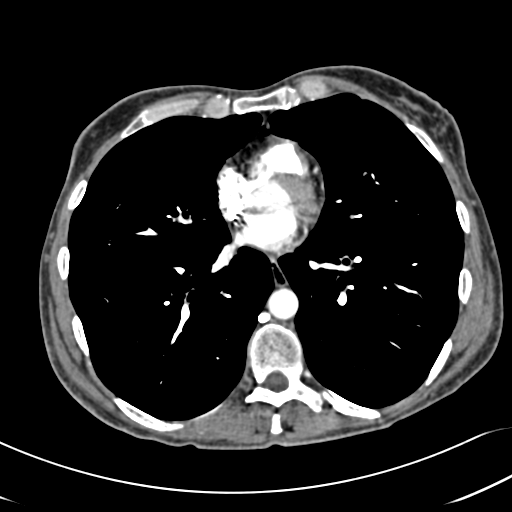
[im 165/517  lung]
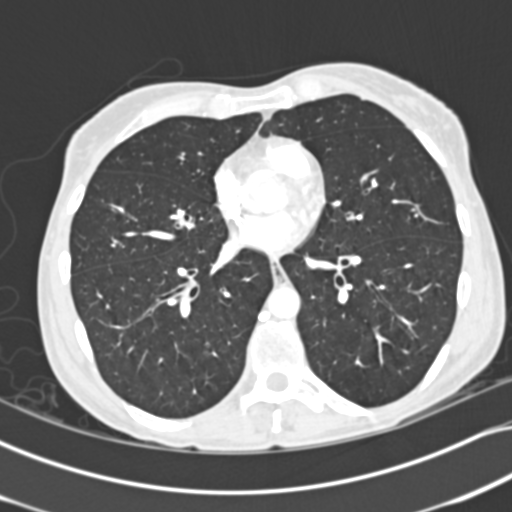
[im 188/517  mediastinal]
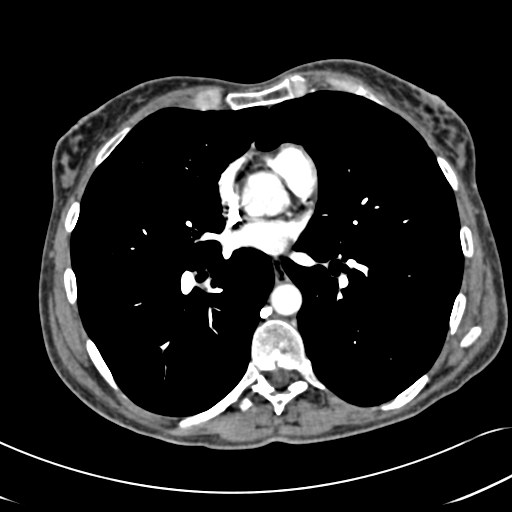
[im 212/517  lung]
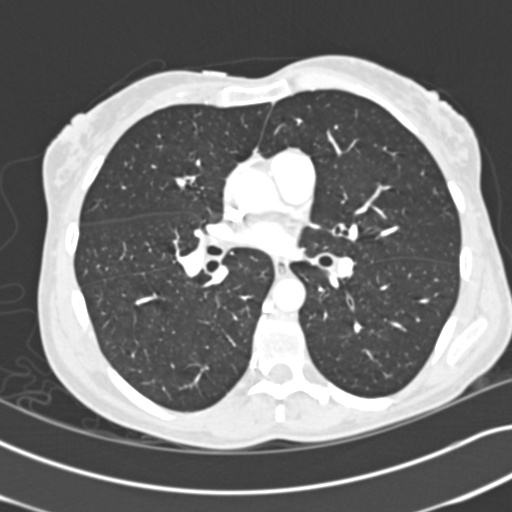
[im 235/517  mediastinal]
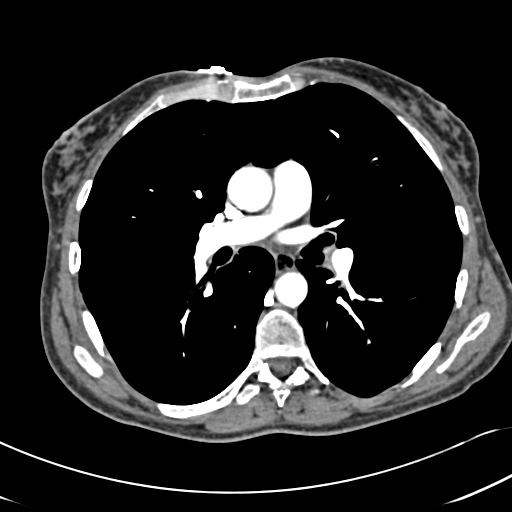
[im 259/517  lung]
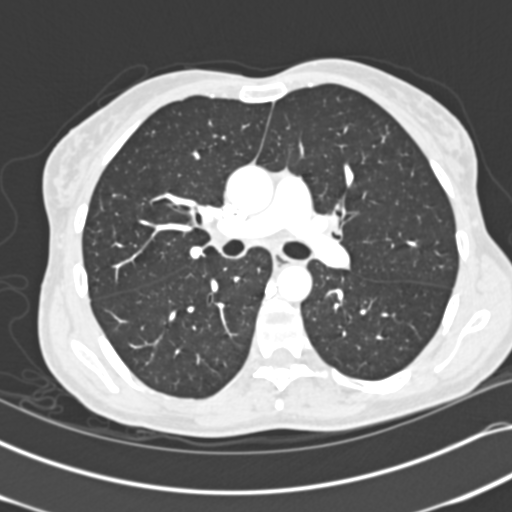
[im 282/517  mediastinal]
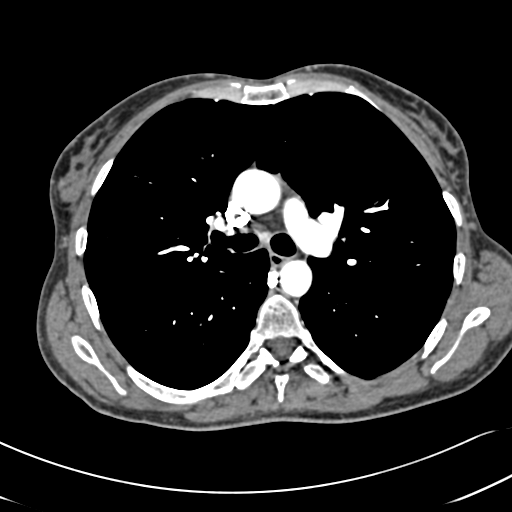
[im 329/517  lung]
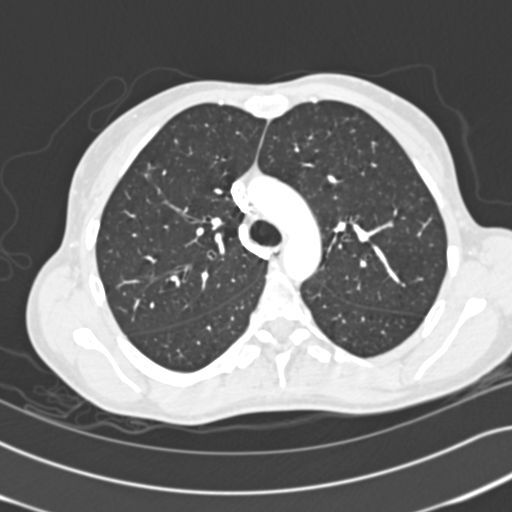
[im 345/517  mediastinal]
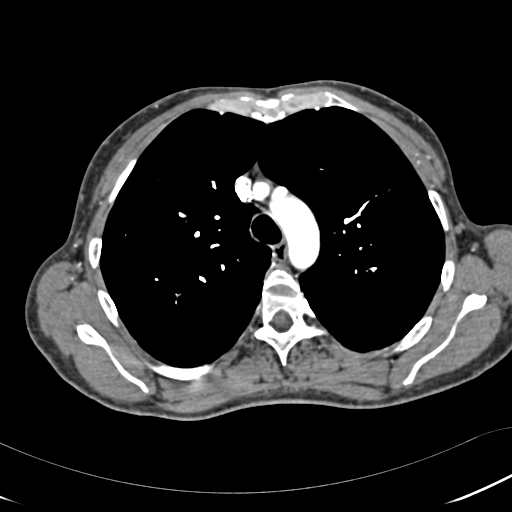
[im 352/517  lung]
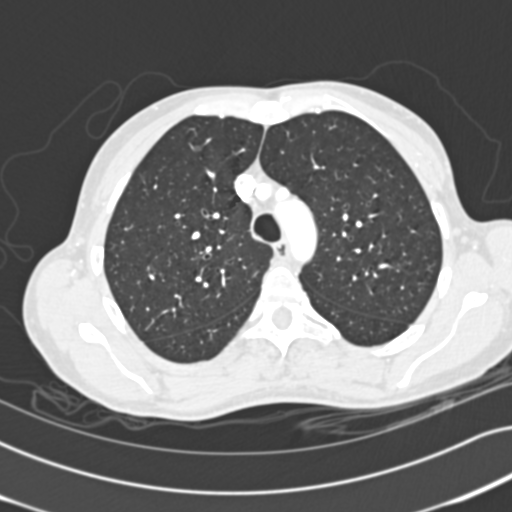
[im 376/517  mediastinal]
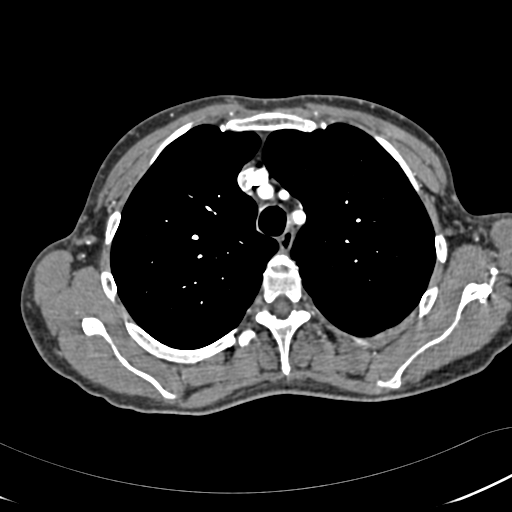
[im 399/517  lung]
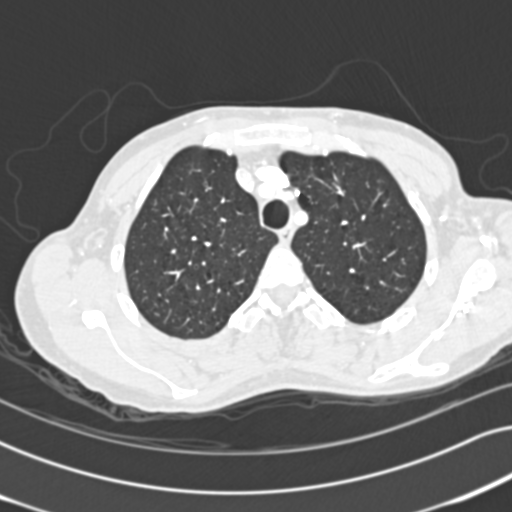
[im 446/517  mediastinal]
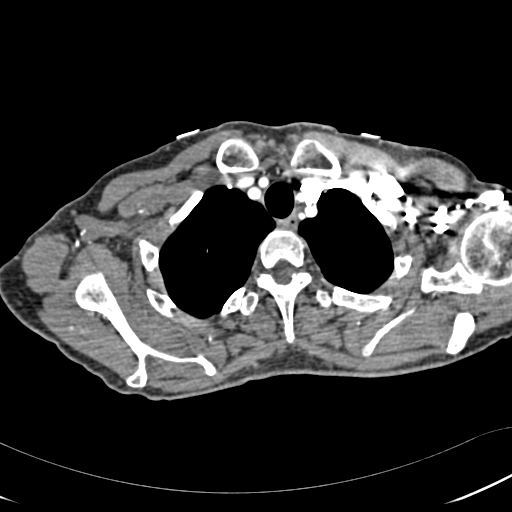
[im 470/517  lung]
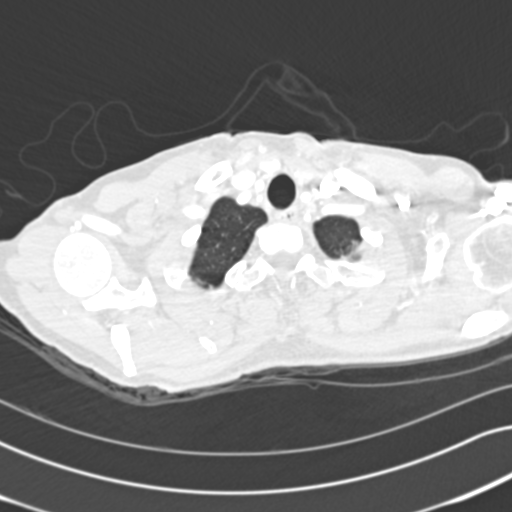
[im 493/517  mediastinal]
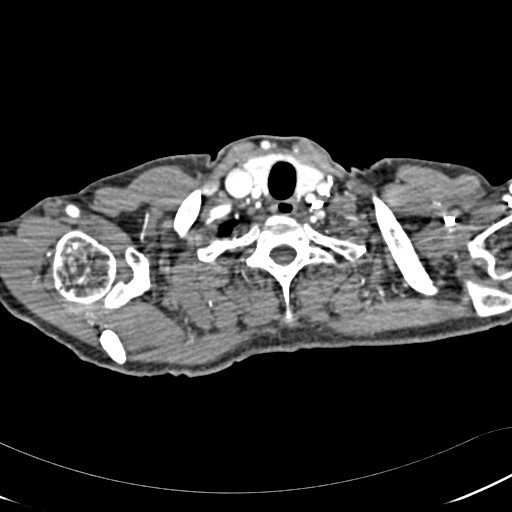

[20 of 32 positions shown; findings below may reference images not displayed]

CT ANGIO CHEST

Multidetector helical CT imaging chest performed using pulmonary embolism protocol after 150 cc
[id number]. Additional coronal and sagittal reconstruction images of the thorax performed to
evaluate the thoracic vasculature.

Aorta normal caliber, without aneurysm or dissection.
Pulmonary arteries patent without evidence of pulmonary embolism.
No thoracic adenopathy.
Few scattered areas of minimal peripheral subsegmental atelectasis or scarring.
Lungs otherwise clear.
Mild bony demineralization with scattered vertebral endplate spur formation.
No acute bony abnormality or pneumothorax.
IMPRESSION: No significant thoracic abnormalities.

## 2006-06-25 ENCOUNTER — Emergency Department (HOSPITAL_COMMUNITY): Admission: EM | Admit: 2006-06-25 | Discharge: 2006-06-25 | Payer: Self-pay | Admitting: Emergency Medicine

## 2007-01-19 IMAGING — CT CT HEAD W/O CM
1 of 2 series · 16 of 30 positions shown, 20 images · IV contrast (agent unspecified)
Comparison: 04/10/04.

CLINICAL DATA: Seizure.  
HEAD CT WITHOUT CONTRAST:

[Series 2: head_seq 4.5 h42s st · axial · 0.43mm/px · z∈[+1165,+1309]mm · 16 of 36 slices shown, 20 images]
[im 2/36  brain]
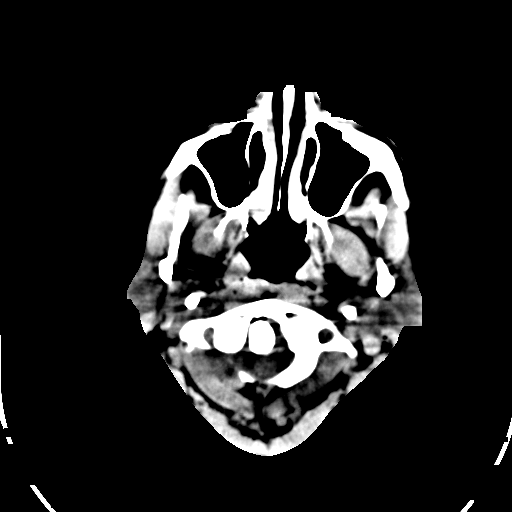
[im 2/36  bone]
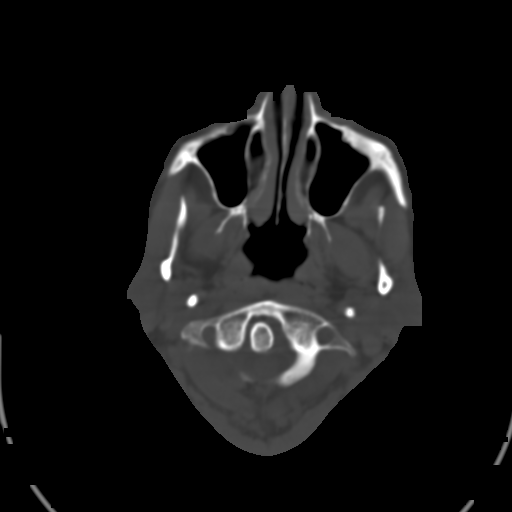
[im 5/36  brain]
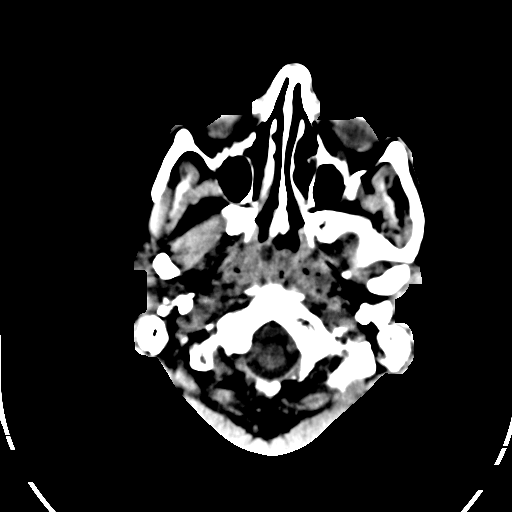
[im 6/36  brain]
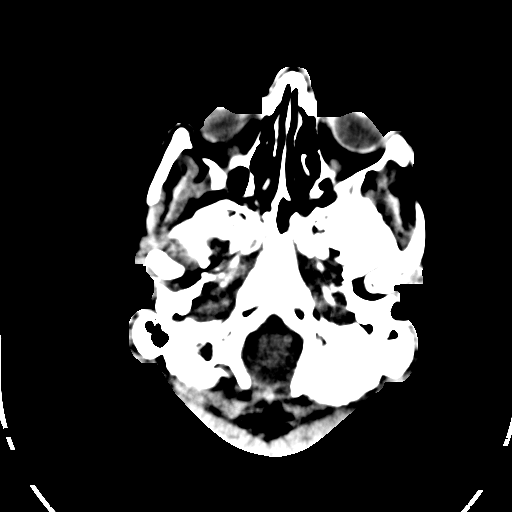
[im 9/36  brain]
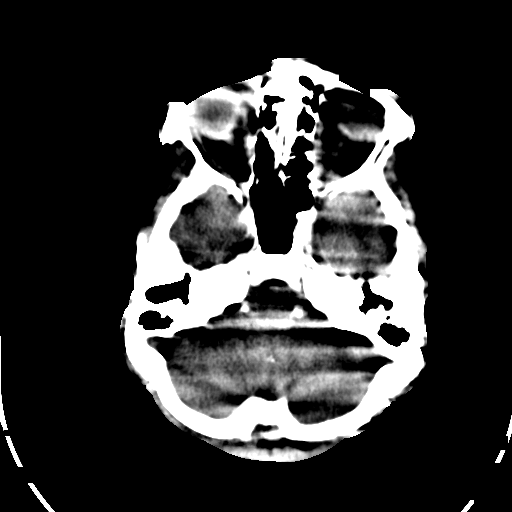
[im 11/36  brain]
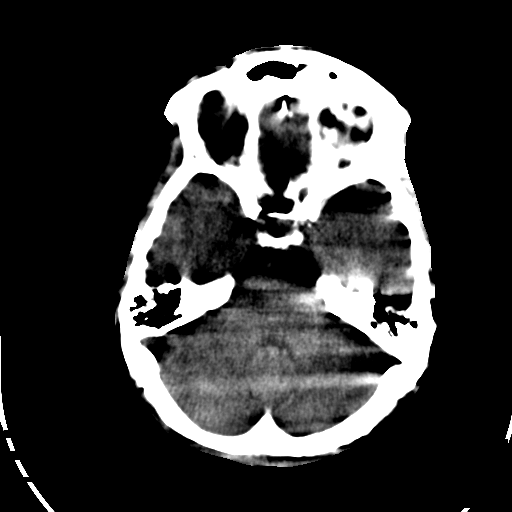
[im 11/36  bone]
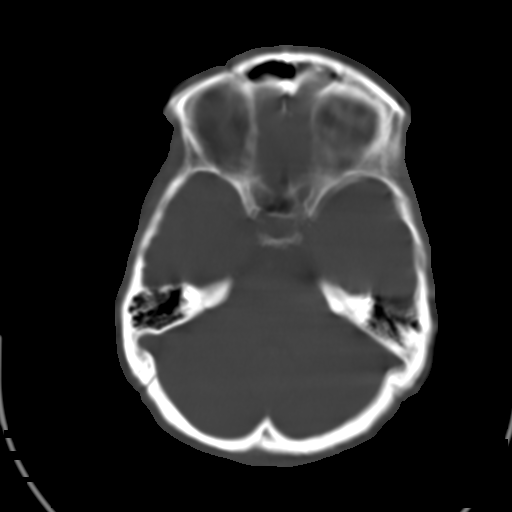
[im 13/36  brain]
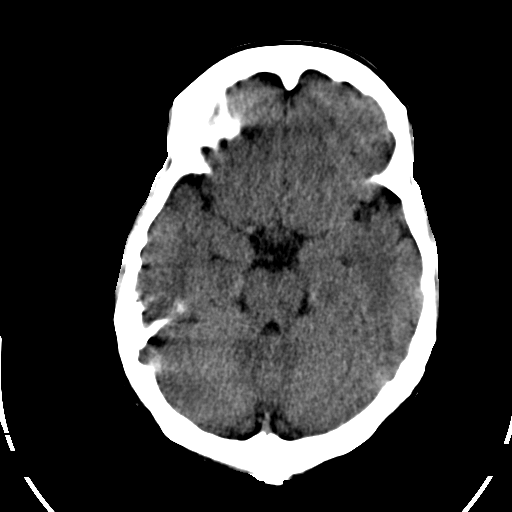
[im 15/36  brain]
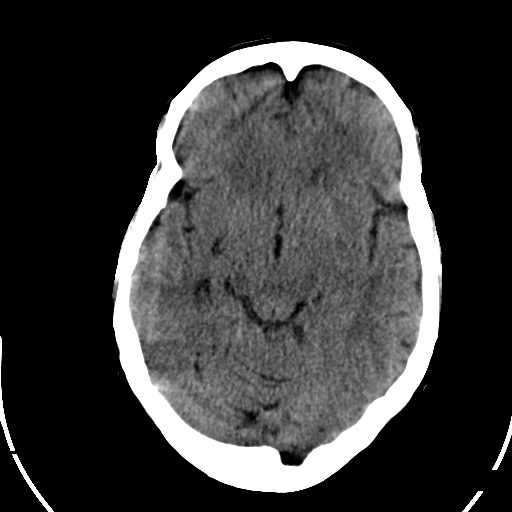
[im 17/36  brain]
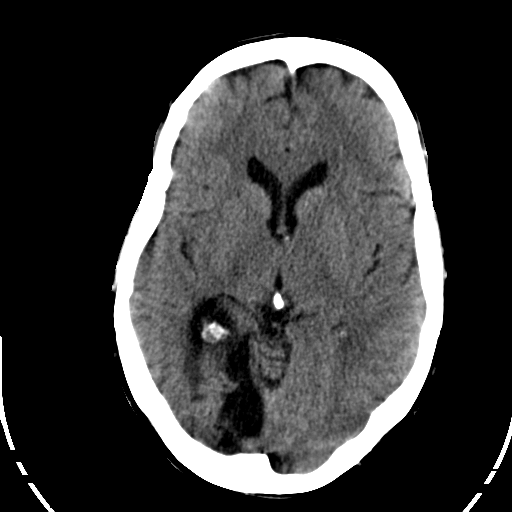
[im 19/36  brain]
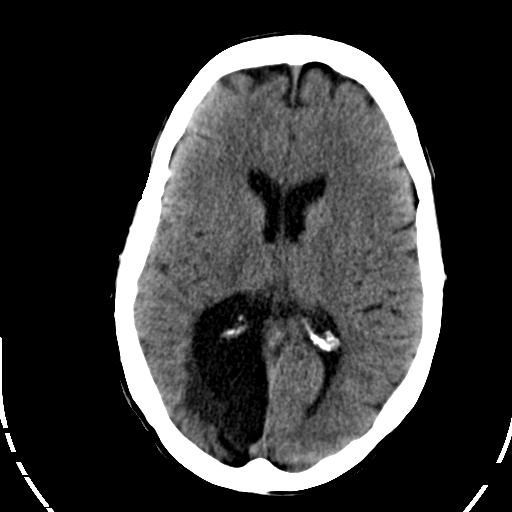
[im 19/36  bone]
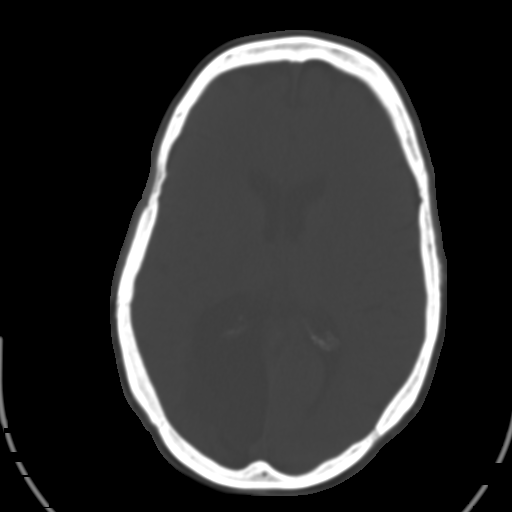
[im 21/36  brain]
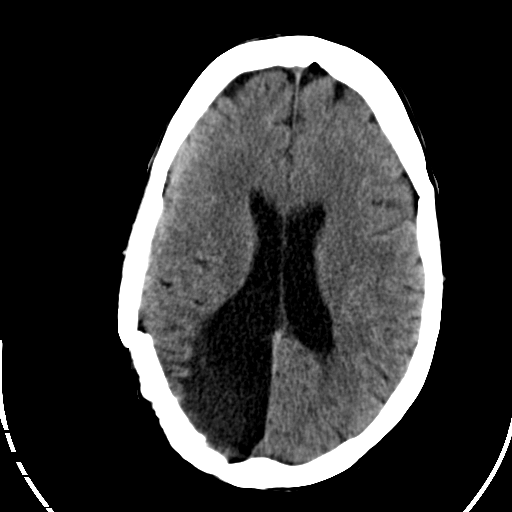
[im 23/36  brain]
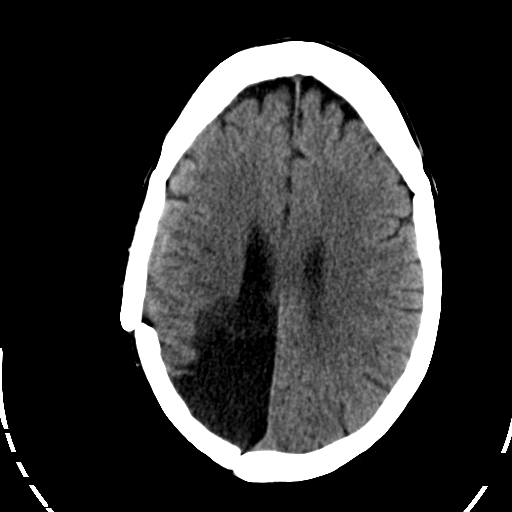
[im 25/36  brain]
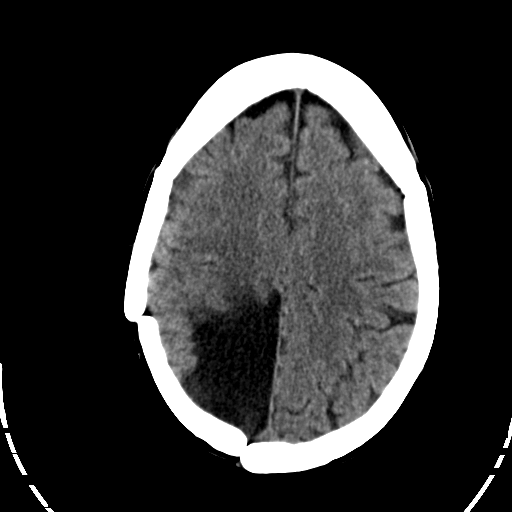
[im 27/36  brain]
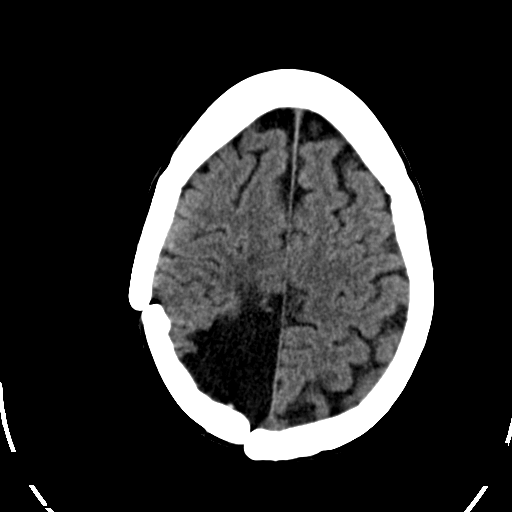
[im 27/36  bone]
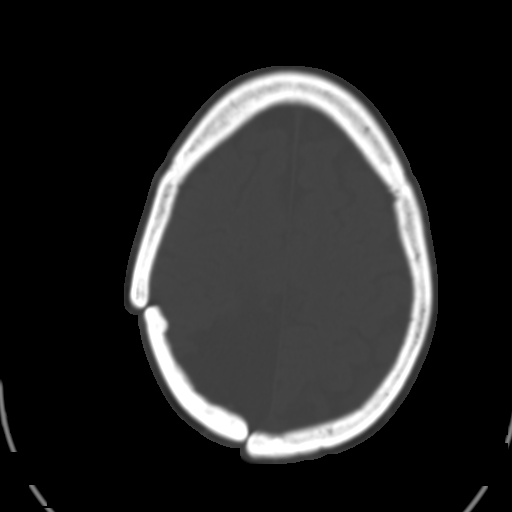
[im 30/36  brain]
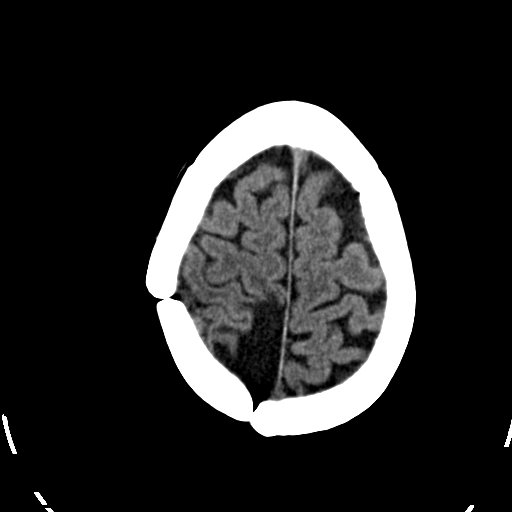
[im 31/36  brain]
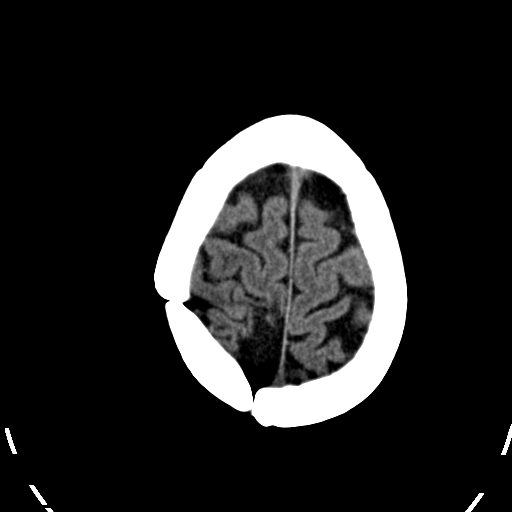
[im 34/36  brain]
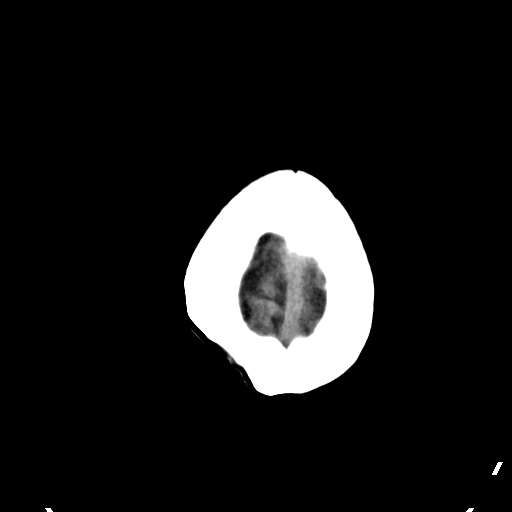

[16 of 30 positions shown; findings below may reference images not displayed]

Chronic encephalomalacia is again seen involving the right posteroparietal and occipital lobes.  Old right posteroparietal craniotomy defect is also seen.  These findings are unchanged.  There is no evidence of intracranial hemorrhage, brain edema, or other acute ischemic changes.  No abnormal extraaxial fluid collections are seen and there is no evidence of mass effect or midline shift.  Ventricles are normal in size except for ex vacuo dilatation of the posterior aspect of the right lateral ventricle.
IMPRESSION: 1.  No acute intracranial findings. 
2.  Chronic right posteroparietal and occipital encephalomalacia.  Old right posteroparietal craniotomy defect.

## 2007-04-16 ENCOUNTER — Inpatient Hospital Stay (HOSPITAL_COMMUNITY): Admission: AD | Admit: 2007-04-16 | Discharge: 2007-04-17 | Payer: Self-pay | Admitting: Internal Medicine

## 2007-04-16 ENCOUNTER — Encounter: Payer: Self-pay | Admitting: Emergency Medicine

## 2007-04-18 ENCOUNTER — Emergency Department (HOSPITAL_COMMUNITY): Admission: EM | Admit: 2007-04-18 | Discharge: 2007-04-19 | Payer: Self-pay | Admitting: Emergency Medicine

## 2007-04-20 ENCOUNTER — Inpatient Hospital Stay (HOSPITAL_COMMUNITY): Admission: EM | Admit: 2007-04-20 | Discharge: 2007-04-21 | Payer: Self-pay | Admitting: Emergency Medicine

## 2007-04-25 ENCOUNTER — Emergency Department (HOSPITAL_COMMUNITY): Admission: EM | Admit: 2007-04-25 | Discharge: 2007-04-25 | Payer: Self-pay | Admitting: Emergency Medicine

## 2007-05-10 ENCOUNTER — Other Ambulatory Visit: Admission: RE | Admit: 2007-05-10 | Discharge: 2007-05-10 | Payer: Self-pay | Admitting: Family Medicine

## 2007-05-26 ENCOUNTER — Inpatient Hospital Stay (HOSPITAL_COMMUNITY): Admission: EM | Admit: 2007-05-26 | Discharge: 2007-05-27 | Payer: Self-pay | Admitting: Emergency Medicine

## 2007-05-28 ENCOUNTER — Emergency Department (HOSPITAL_COMMUNITY): Admission: EM | Admit: 2007-05-28 | Discharge: 2007-05-28 | Payer: Self-pay | Admitting: *Deleted
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
## 2007-08-25 IMAGING — CR DG THORACIC SPINE 2V
4 series · 4 of 4 positions shown · non-contrast
Comparison: none

CLINICAL DATA: The patient had pain in the back.  
 THORACIC SPINE - 2 VIEW:

[t t-spine a.p.]
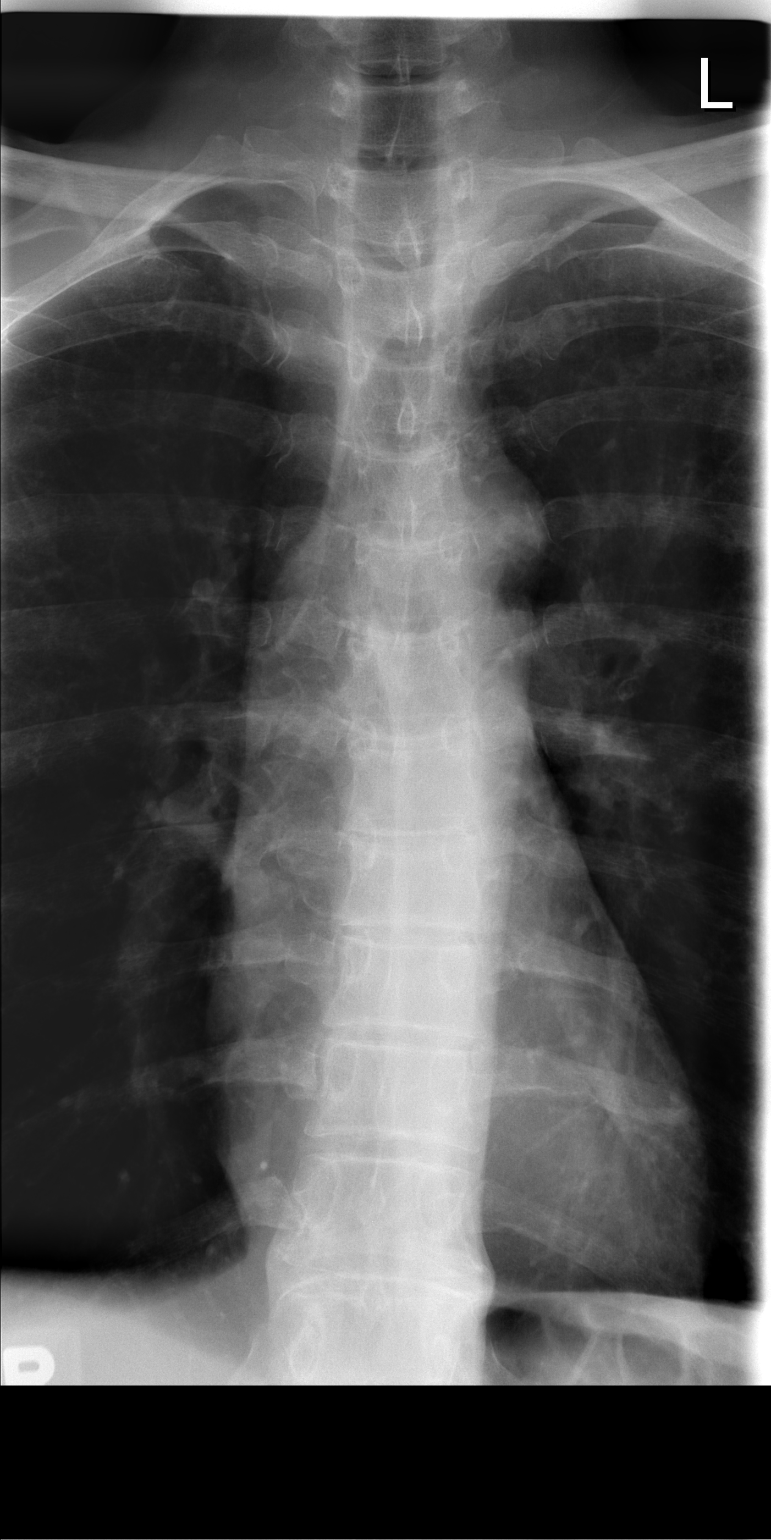

[t t-spine lat (1 of 2)]
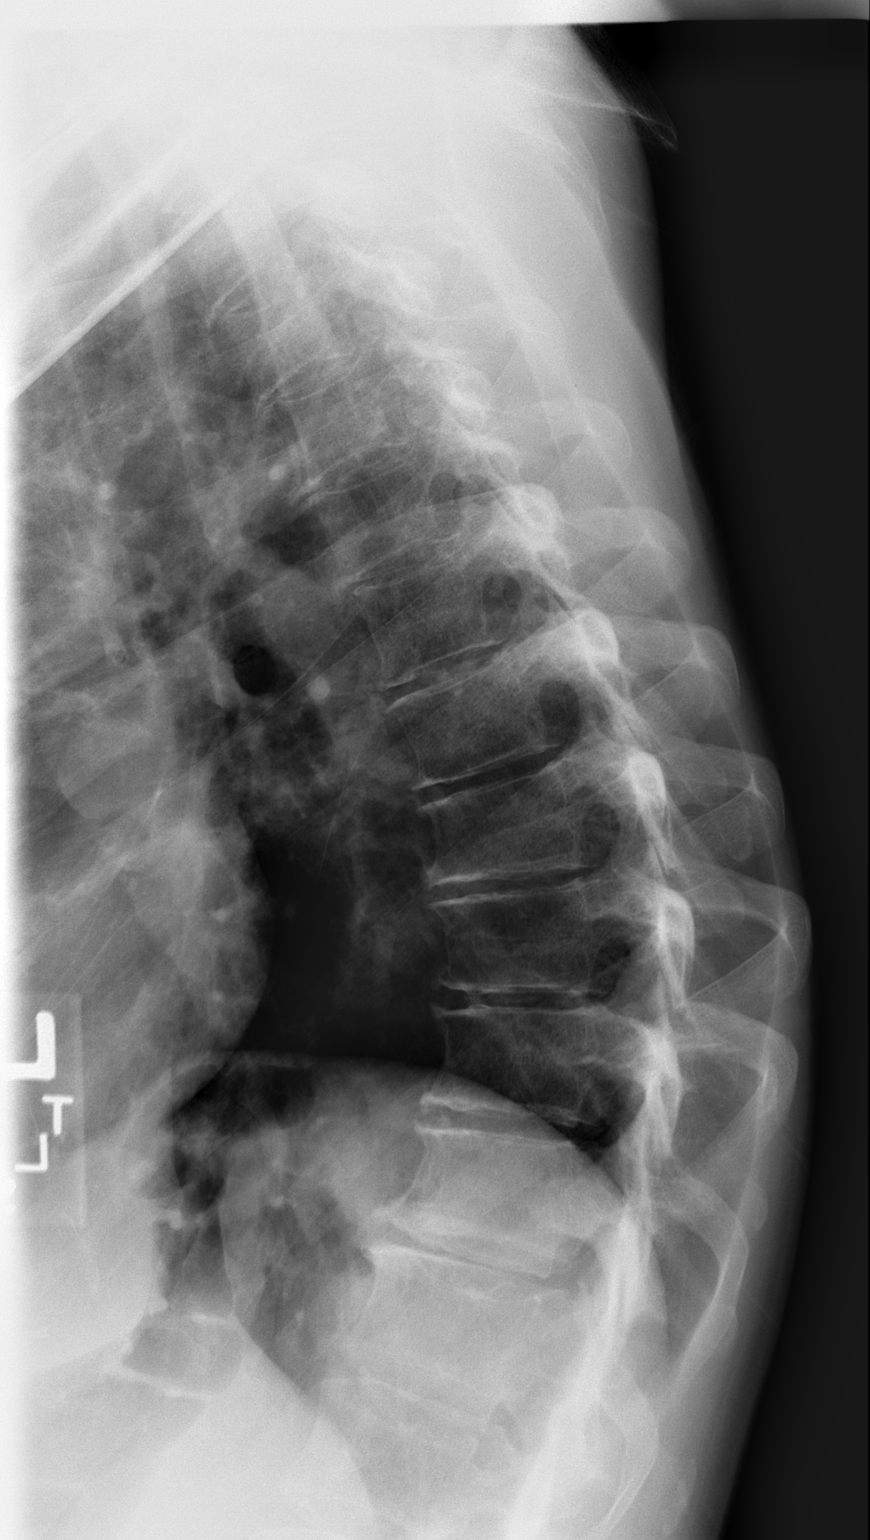

[t t-spine lat (2 of 2)]
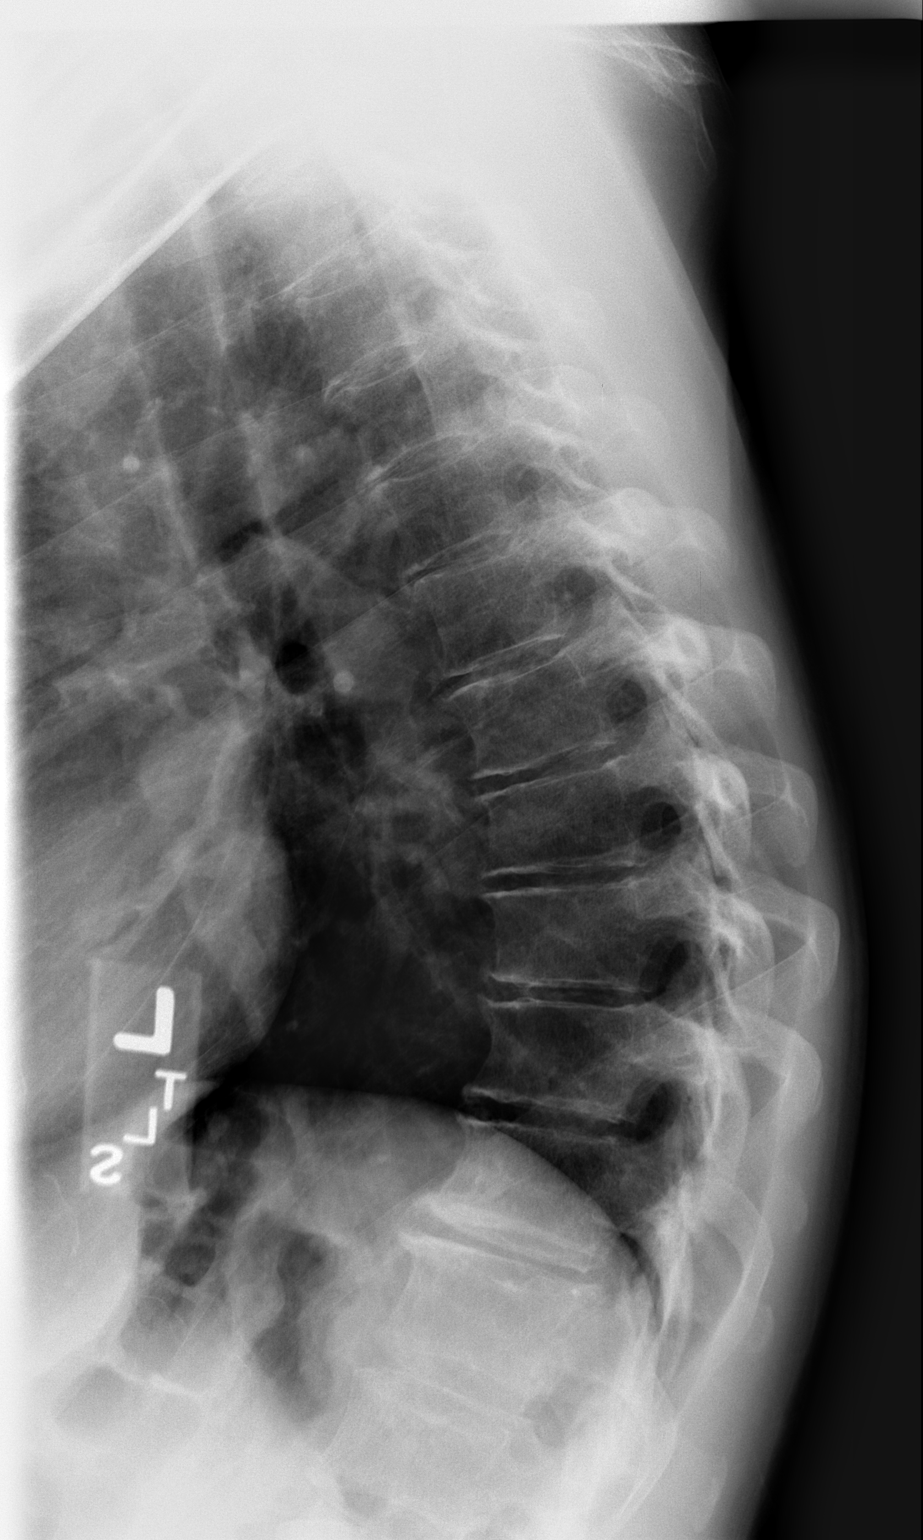

[t swimmers]
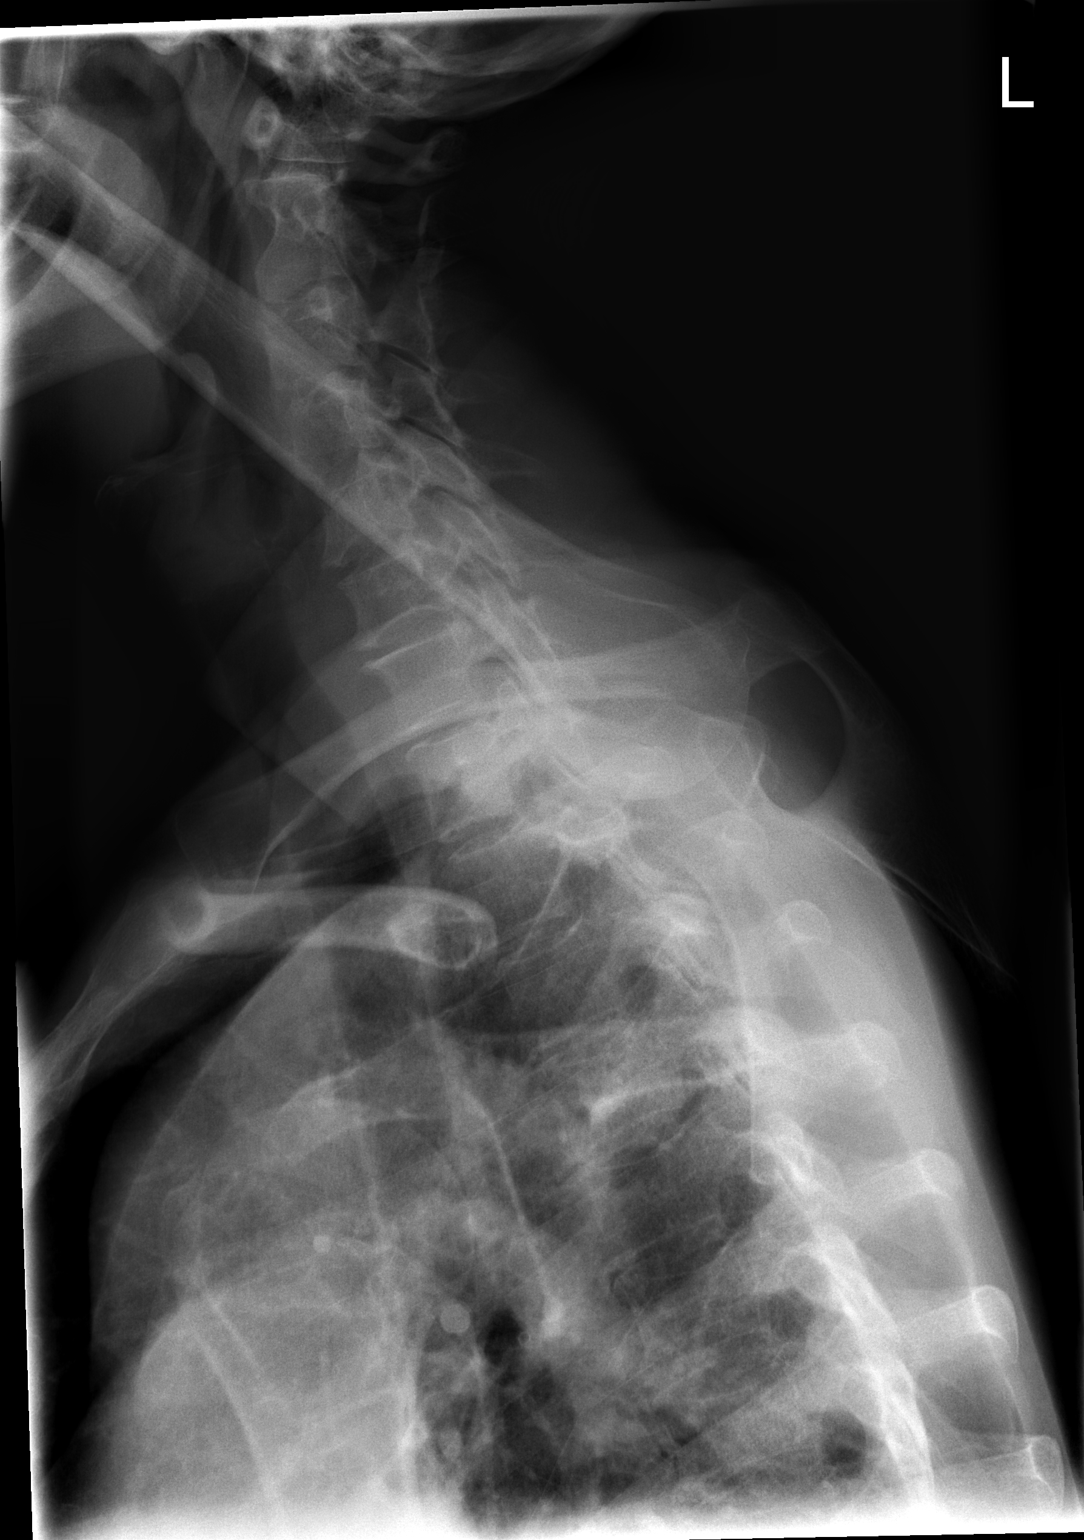

[4 of 4 positions shown; findings below may reference images not displayed]

FINDINGS: AP and lateral views reveal mild scoliosis with diffuse degenerative change, particularly in the lower thoracic spine.  No acute abnormality.
IMPRESSION: No acute abnormality.

## 2007-11-12 IMAGING — CT CT HEAD W/O CM
1 series · 16 of 30 positions shown, 20 images · non-contrast
Comparison: 12/09/04.
 HEAD CT WITHOUT CONTRAST:

CLINICAL DATA: Headache, dizziness.  Left sided facial numbness.
TECHNIQUE: Contiguous axial images were obtained from the base of the skull through the vertex, according to standard protocol, without contrast.

[Series 3: brain · axial · 0.47mm/px · z∈[+132,+266]mm · 16 of 30 slices shown, 20 images]
[im 2/30  brain]
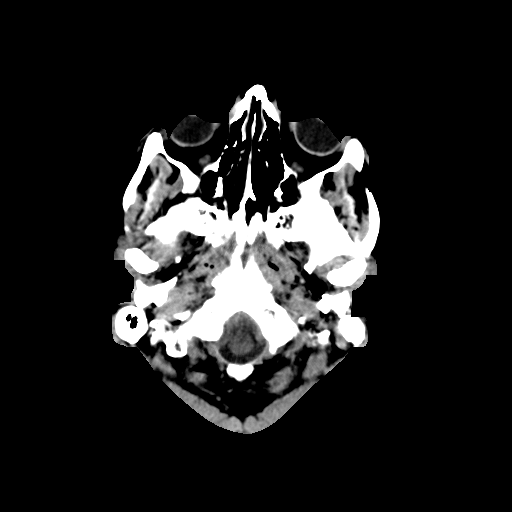
[im 2/30  bone]
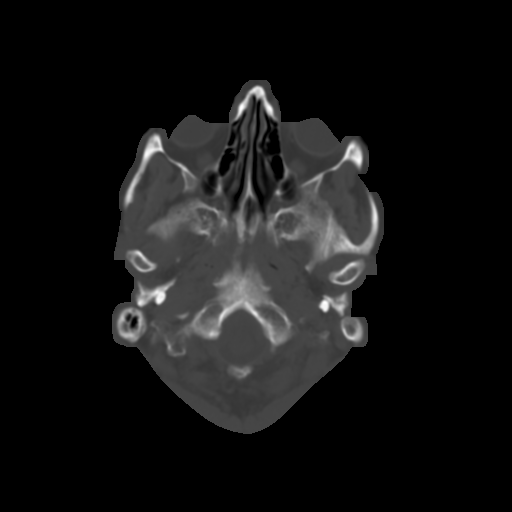
[im 4/30  brain]
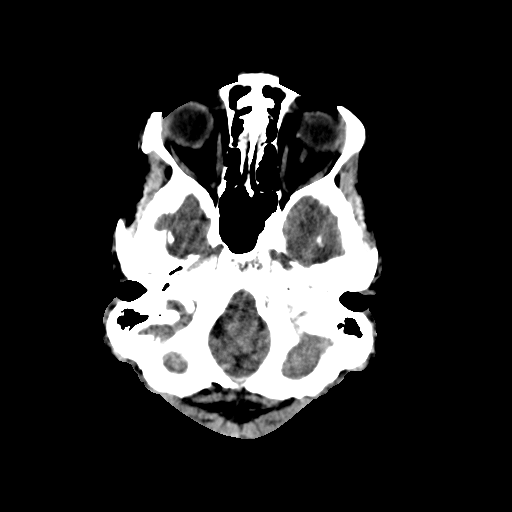
[im 6/30  brain]
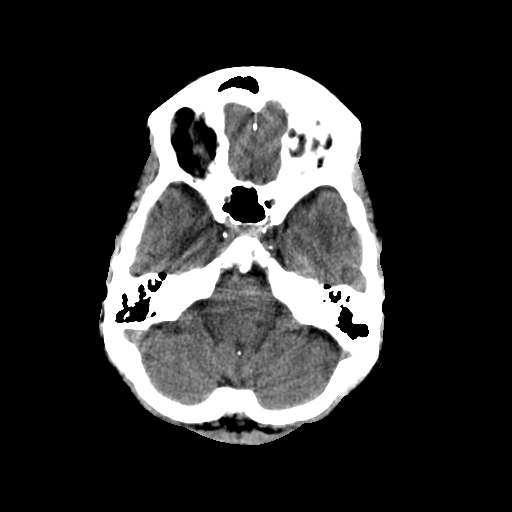
[im 8/30  brain]
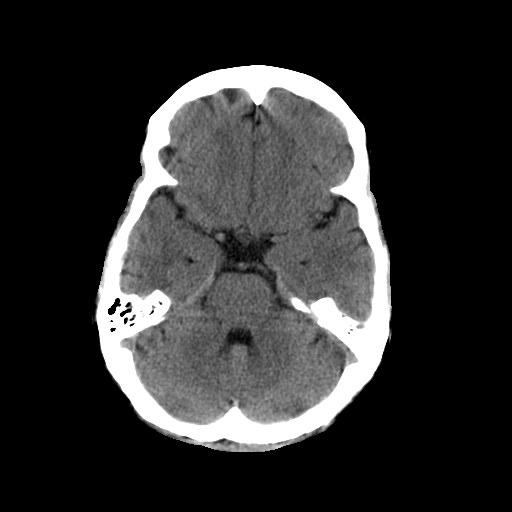
[im 9/30  brain]
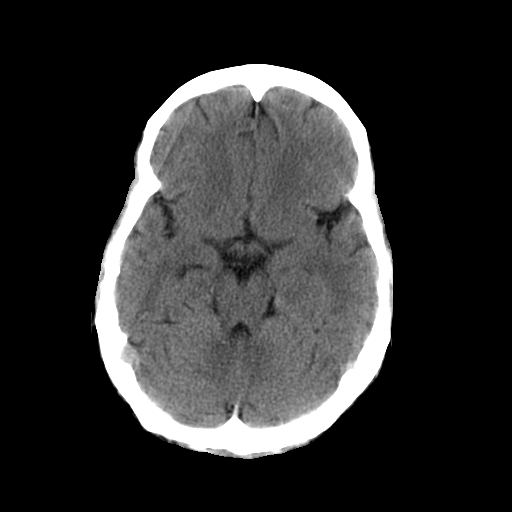
[im 9/30  bone]
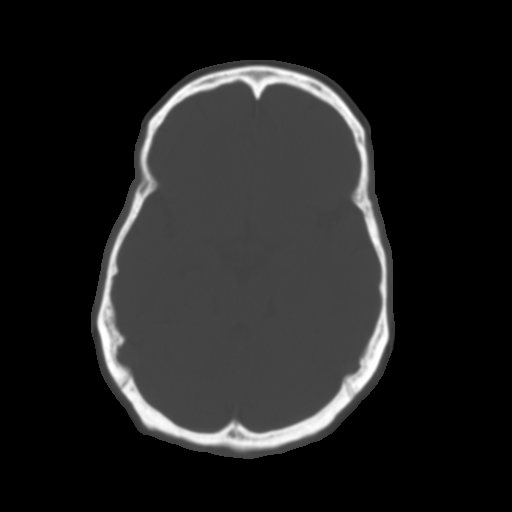
[im 11/30  brain]
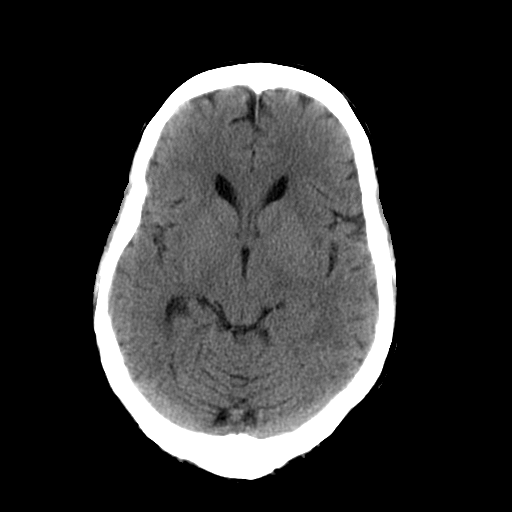
[im 13/30  brain]
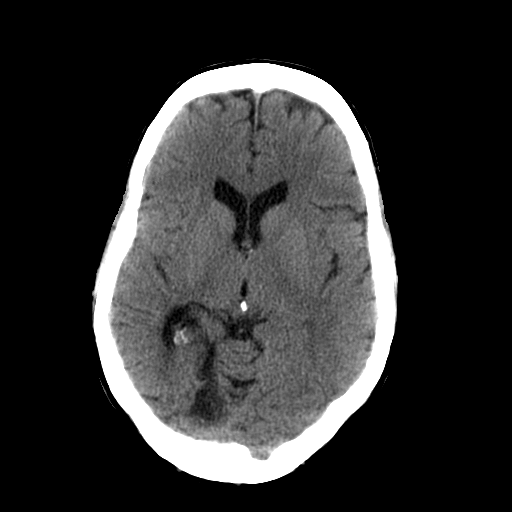
[im 15/30  brain]
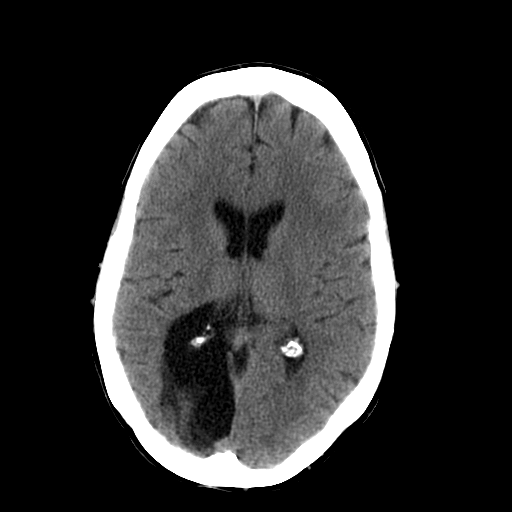
[im 16/30  brain]
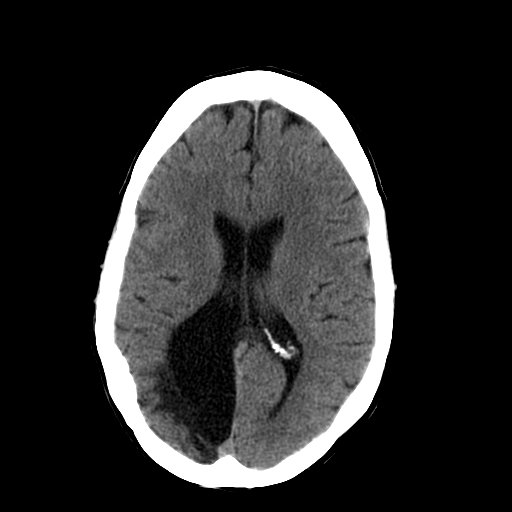
[im 16/30  bone]
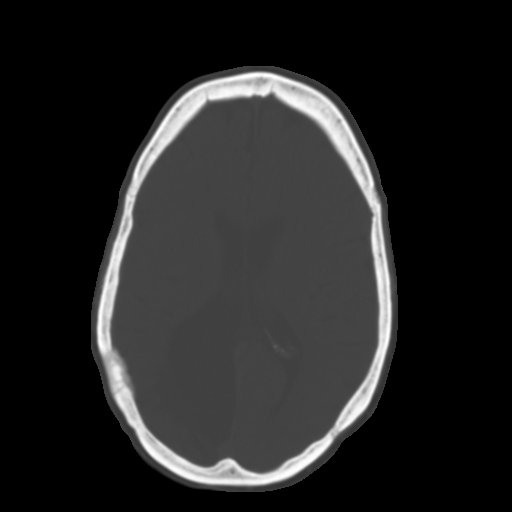
[im 18/30  brain]
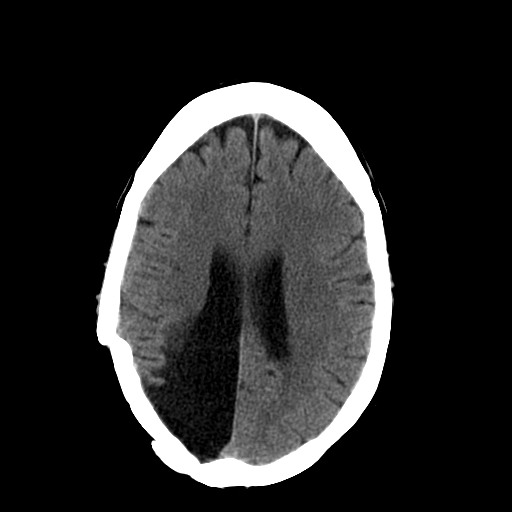
[im 20/30  brain]
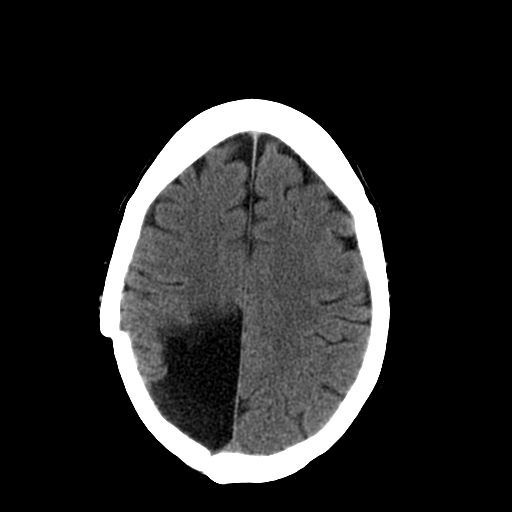
[im 22/30  brain]
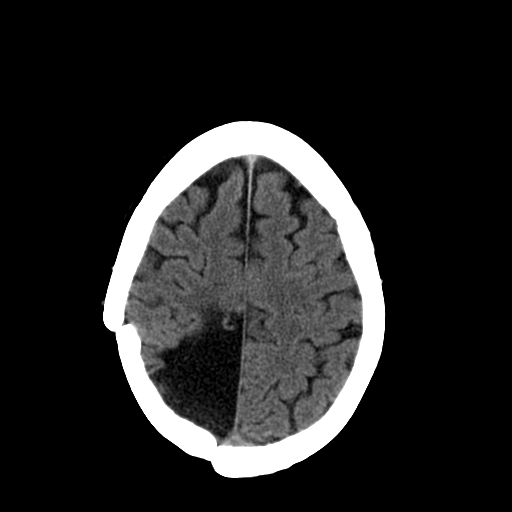
[im 23/30  brain]
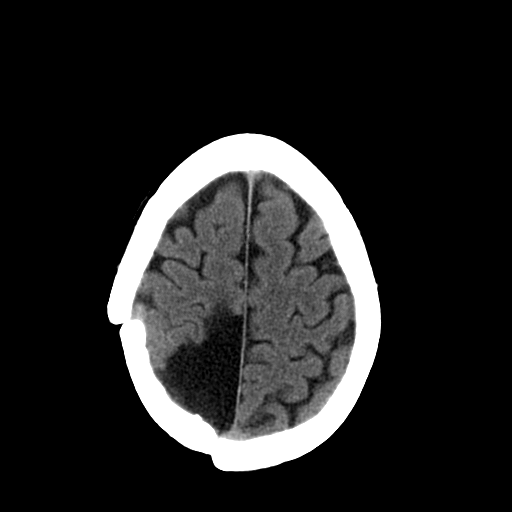
[im 23/30  bone]
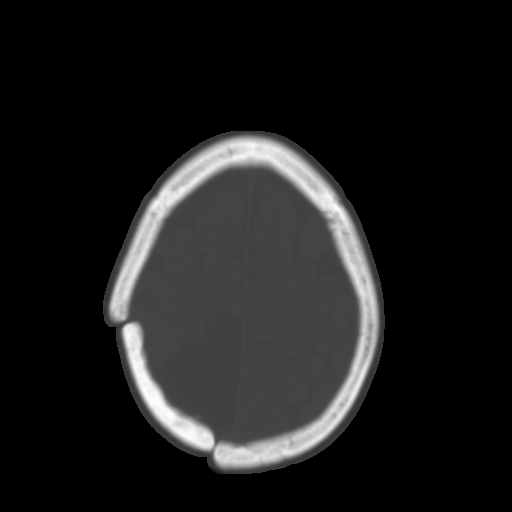
[im 25/30  brain]
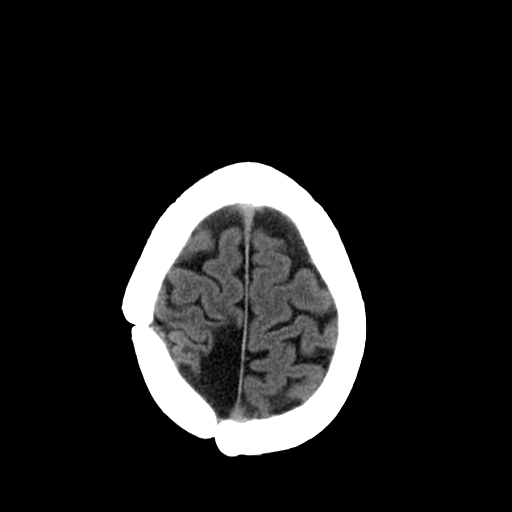
[im 27/30  brain]
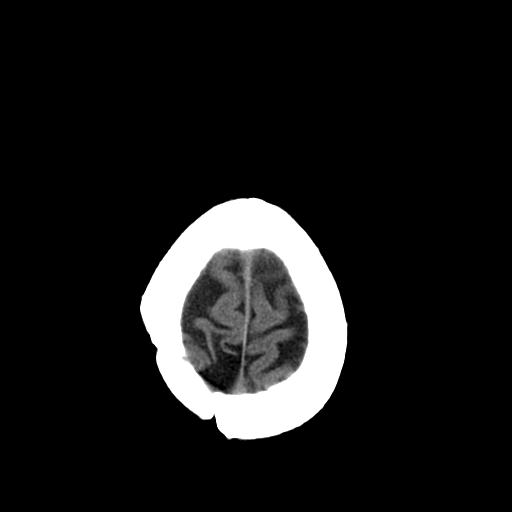
[im 29/30  brain]
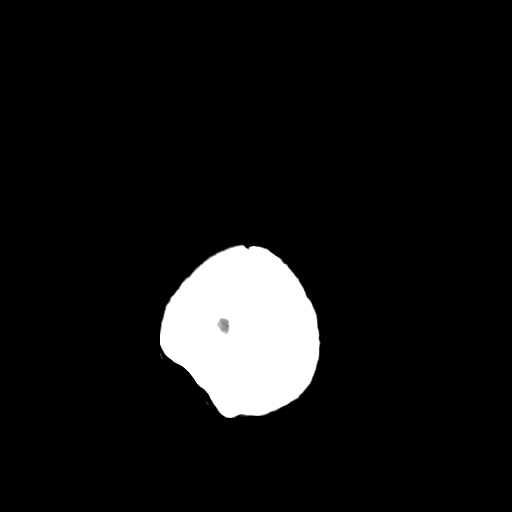

[16 of 30 positions shown; findings below may reference images not displayed]

FINDINGS: A right craniotomy is noted.  Encephalomalacia in the right parietal lobe is stable.  Ex vacuo dilatation of the right occipital horn is stable.  There is no mass effect, midline shift or acute intracranial hemorrhage.
IMPRESSION: No acute intracranial pathology.  Postoperative changes.

## 2007-11-15 IMAGING — CT CT HEAD W/O CM
1 of 2 series · 13 of 30 positions shown, 17 images · IV contrast (agent unspecified)
Comparison: 10/02/05.

CLINICAL DATA: Headache.  History of seizures and intracerebral hemorrhage with craniotomy in 5600.
 HEAD CT WITHOUT CONTRAST:
TECHNIQUE: Contiguous axial images were obtained from the base of the skull through the vertex according to standard protocol without contrast.

[Series 2: brain · axial · 0.47mm/px · z∈[+131,+252]mm · 13 of 28 slices shown, 17 images]
[im 2/28  brain]
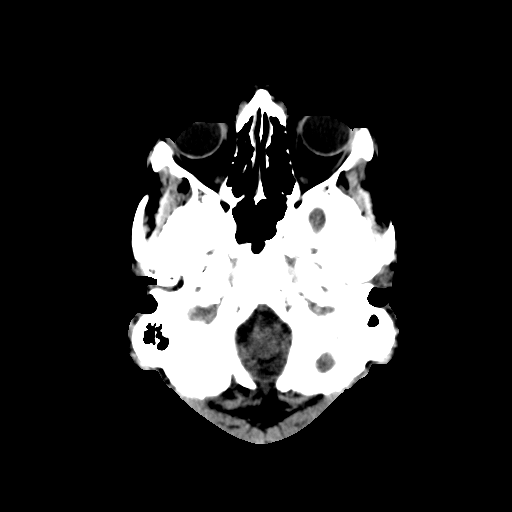
[im 2/28  bone]
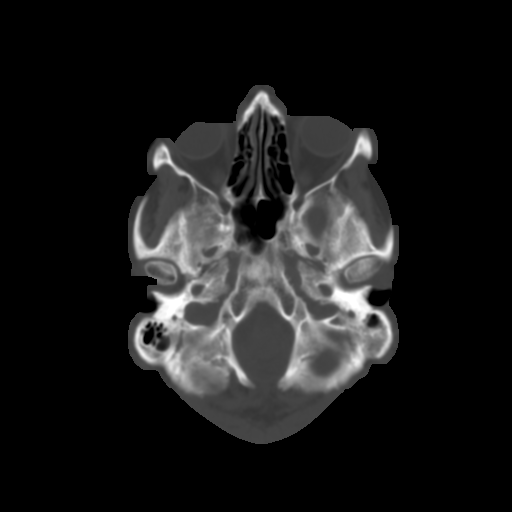
[im 4/28  brain]
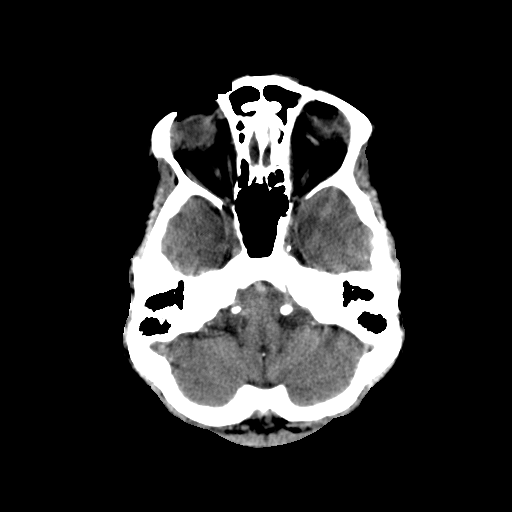
[im 6/28  brain]
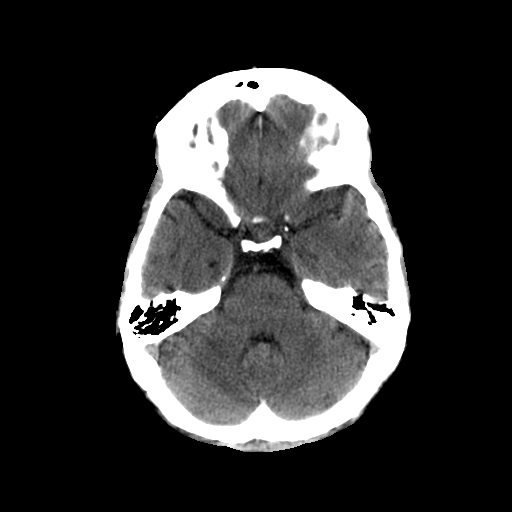
[im 8/28  brain]
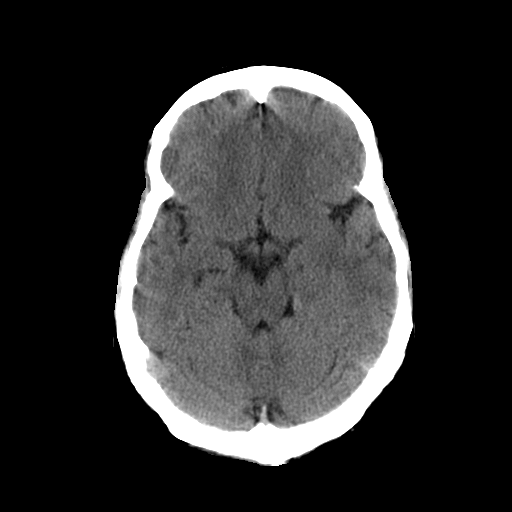
[im 10/28  brain]
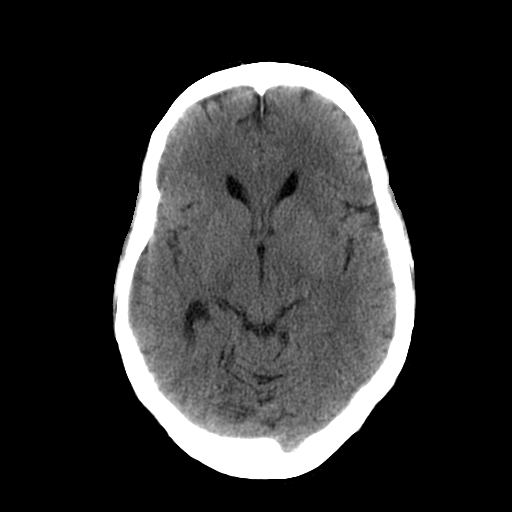
[im 10/28  bone]
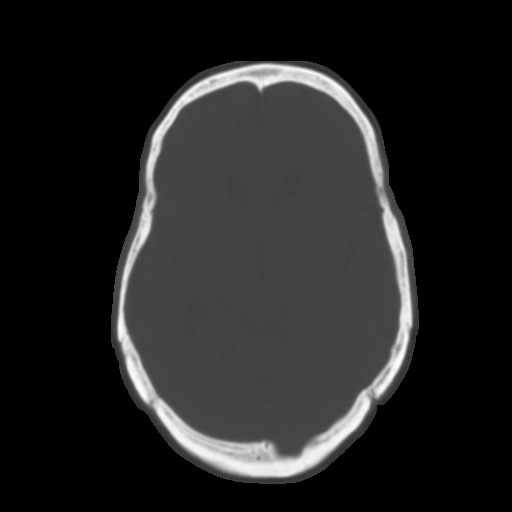
[im 12/28  brain]
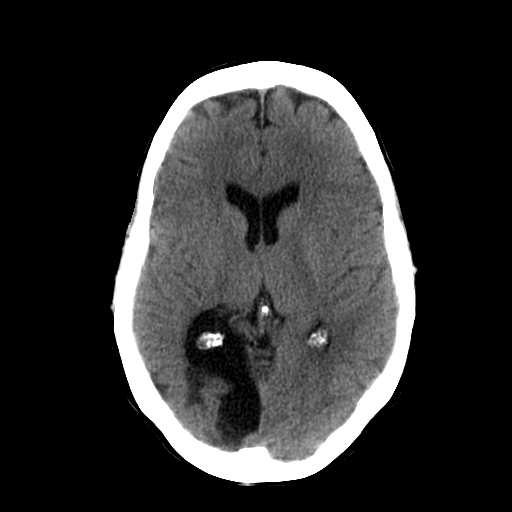
[im 14/28  brain]
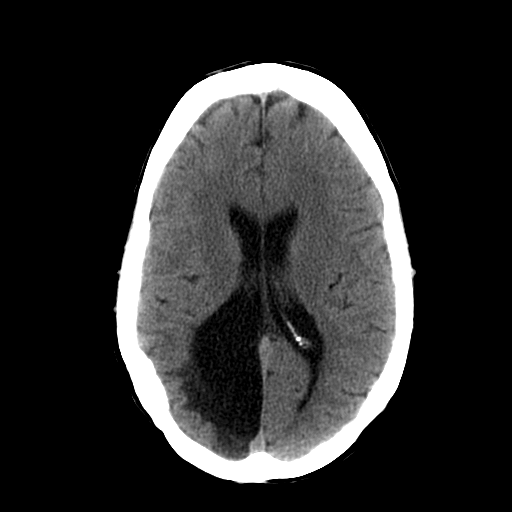
[im 16/28  brain]
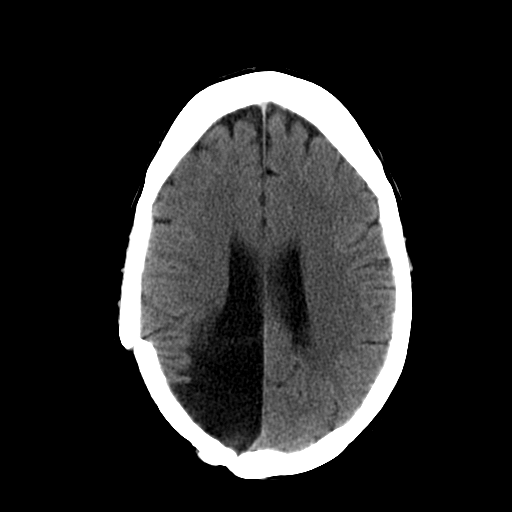
[im 18/28  brain]
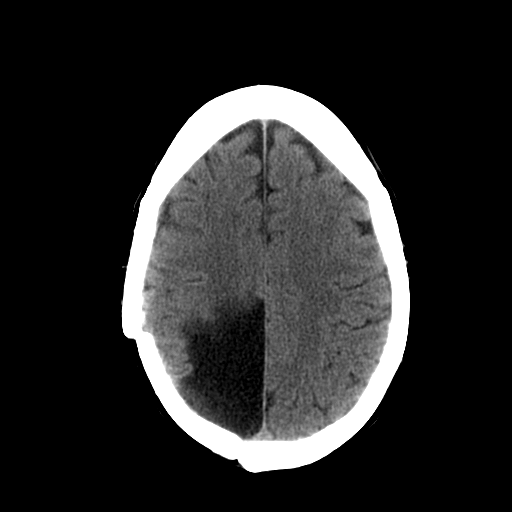
[im 18/28  bone]
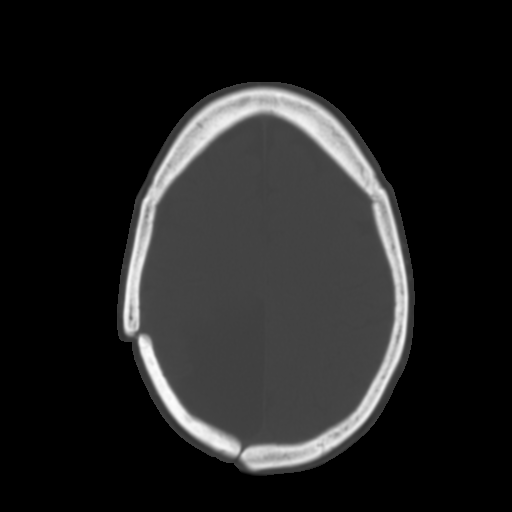
[im 20/28  brain]
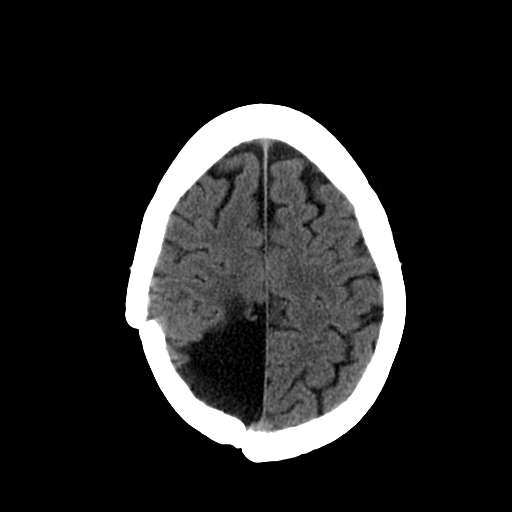
[im 22/28  brain]
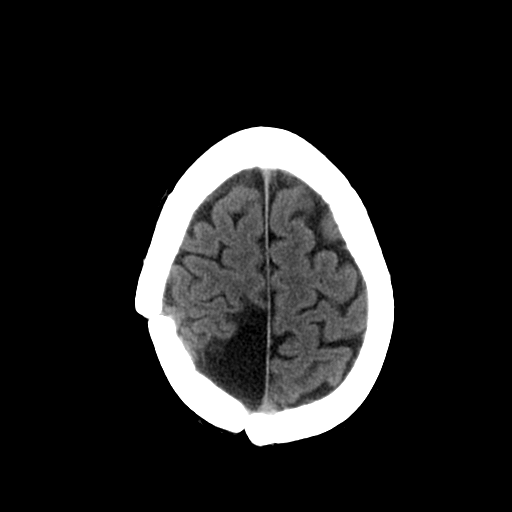
[im 24/28  brain]
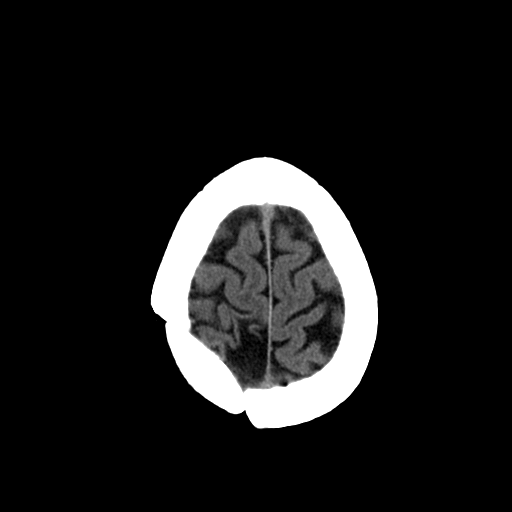
[im 26/28  brain]
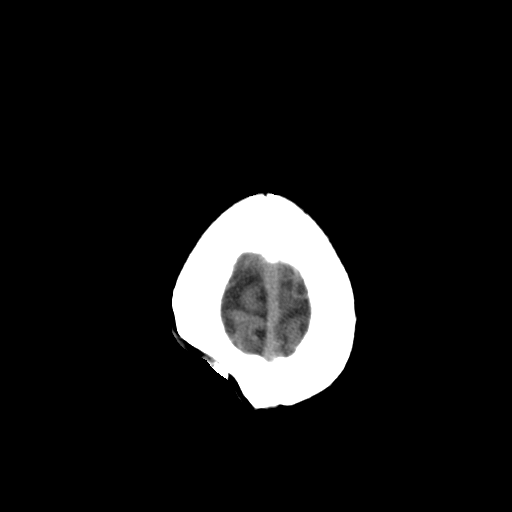
[im 26/28  bone]
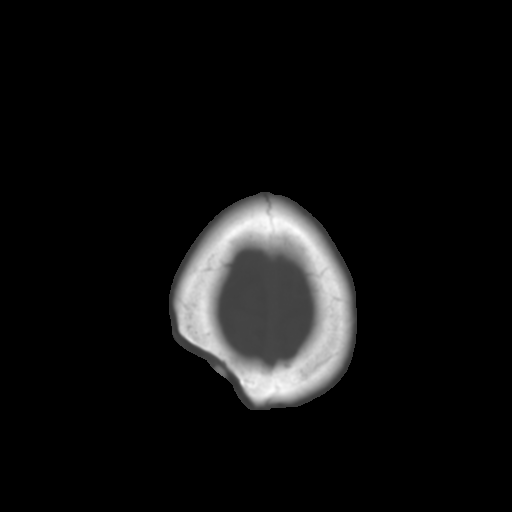

[13 of 30 positions shown; findings below may reference images not displayed]

FINDINGS: Soft tissue windows demonstrate normal appearance of the orbits.  No soft tissue swelling.  Bone windows demonstrate right parietooccipital craniotomy.  Intracranial imaging demonstrates similar appearance of encephalomalacia in the right occipital lobe with resultant ex vacuo dilatation of the occipital horn of the right lateral ventricle.  There is no evidence of acute infract, hemorrhage, hydrocephalus, intraaxial, or extraaxial fluid collection.
IMPRESSION: 1.  No acute intracranial findings. 
 2.  Postsurgical changes.

## 2007-11-15 IMAGING — CR DG CHEST 2V
2 series · 2 of 2 positions shown · non-contrast
Comparison: 05/12/04.

CLINICAL DATA: Chest pain and headache.  One pack/day smoker. 
 CHEST ? 2 VIEW:

[view not recorded (1 of 2)]
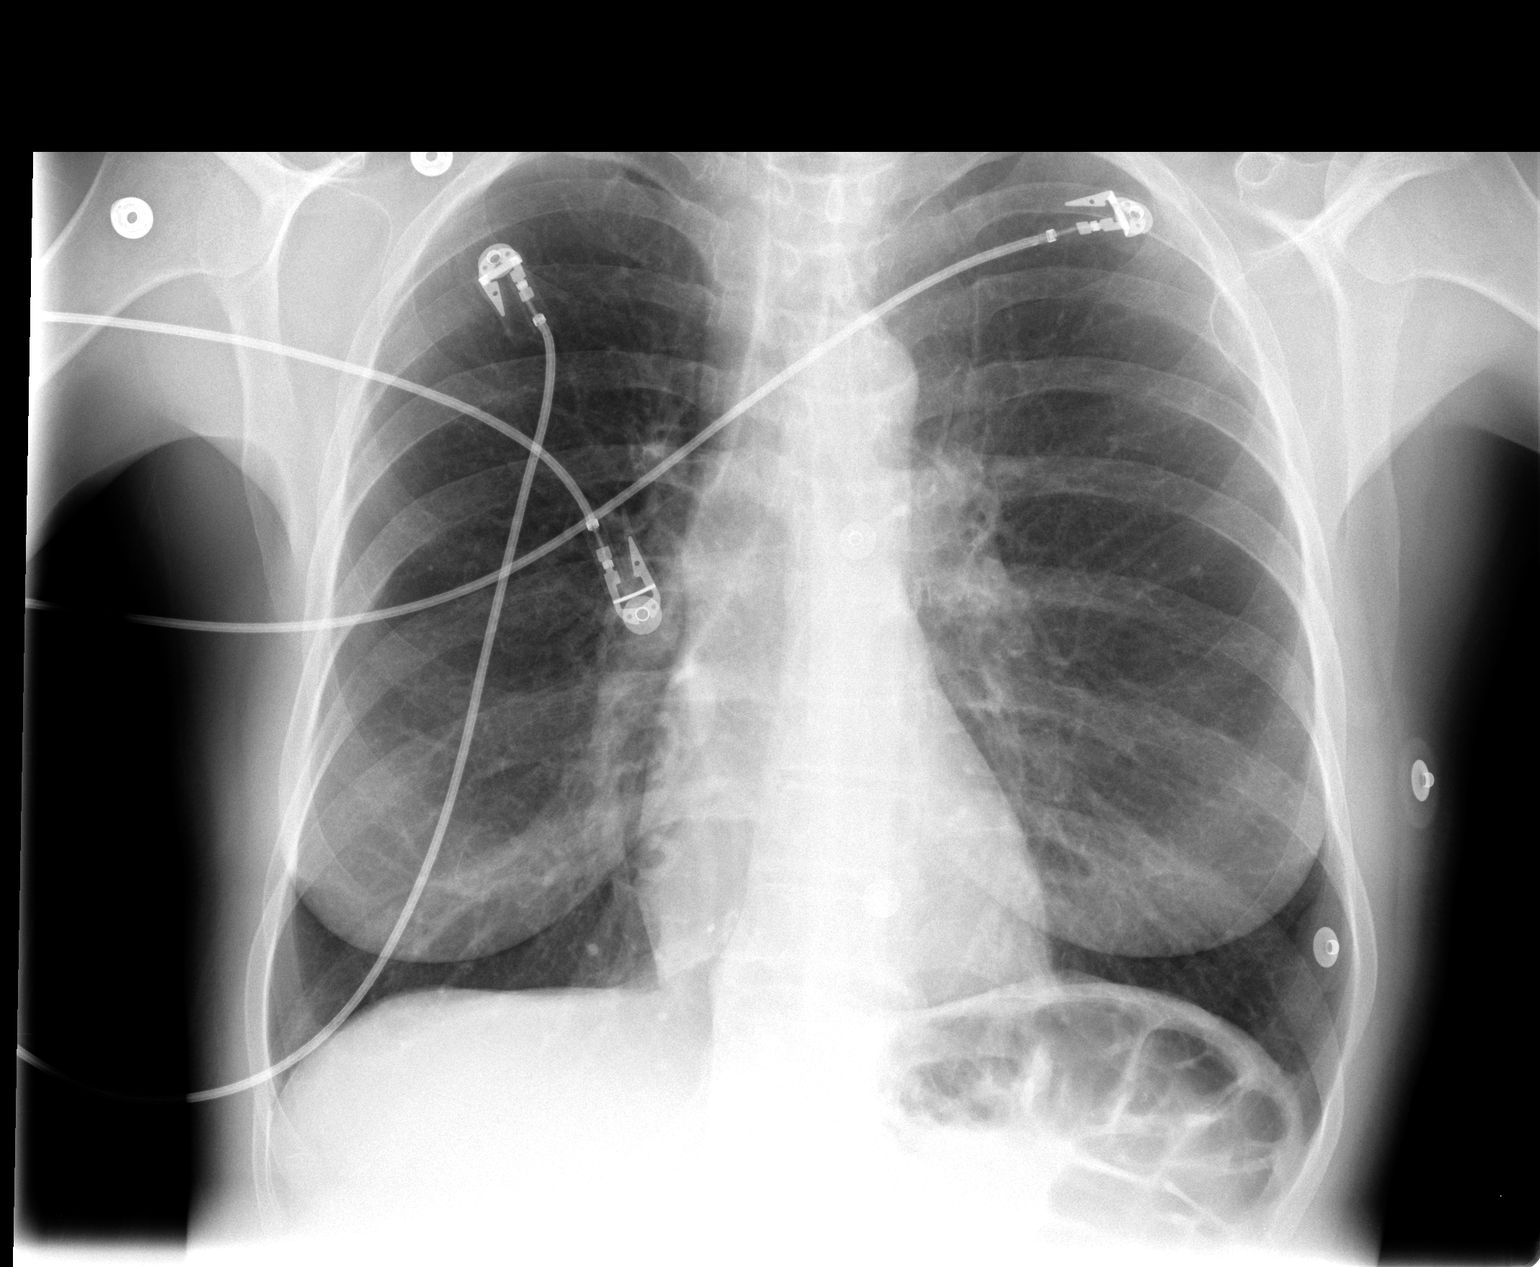

[view not recorded (2 of 2)]
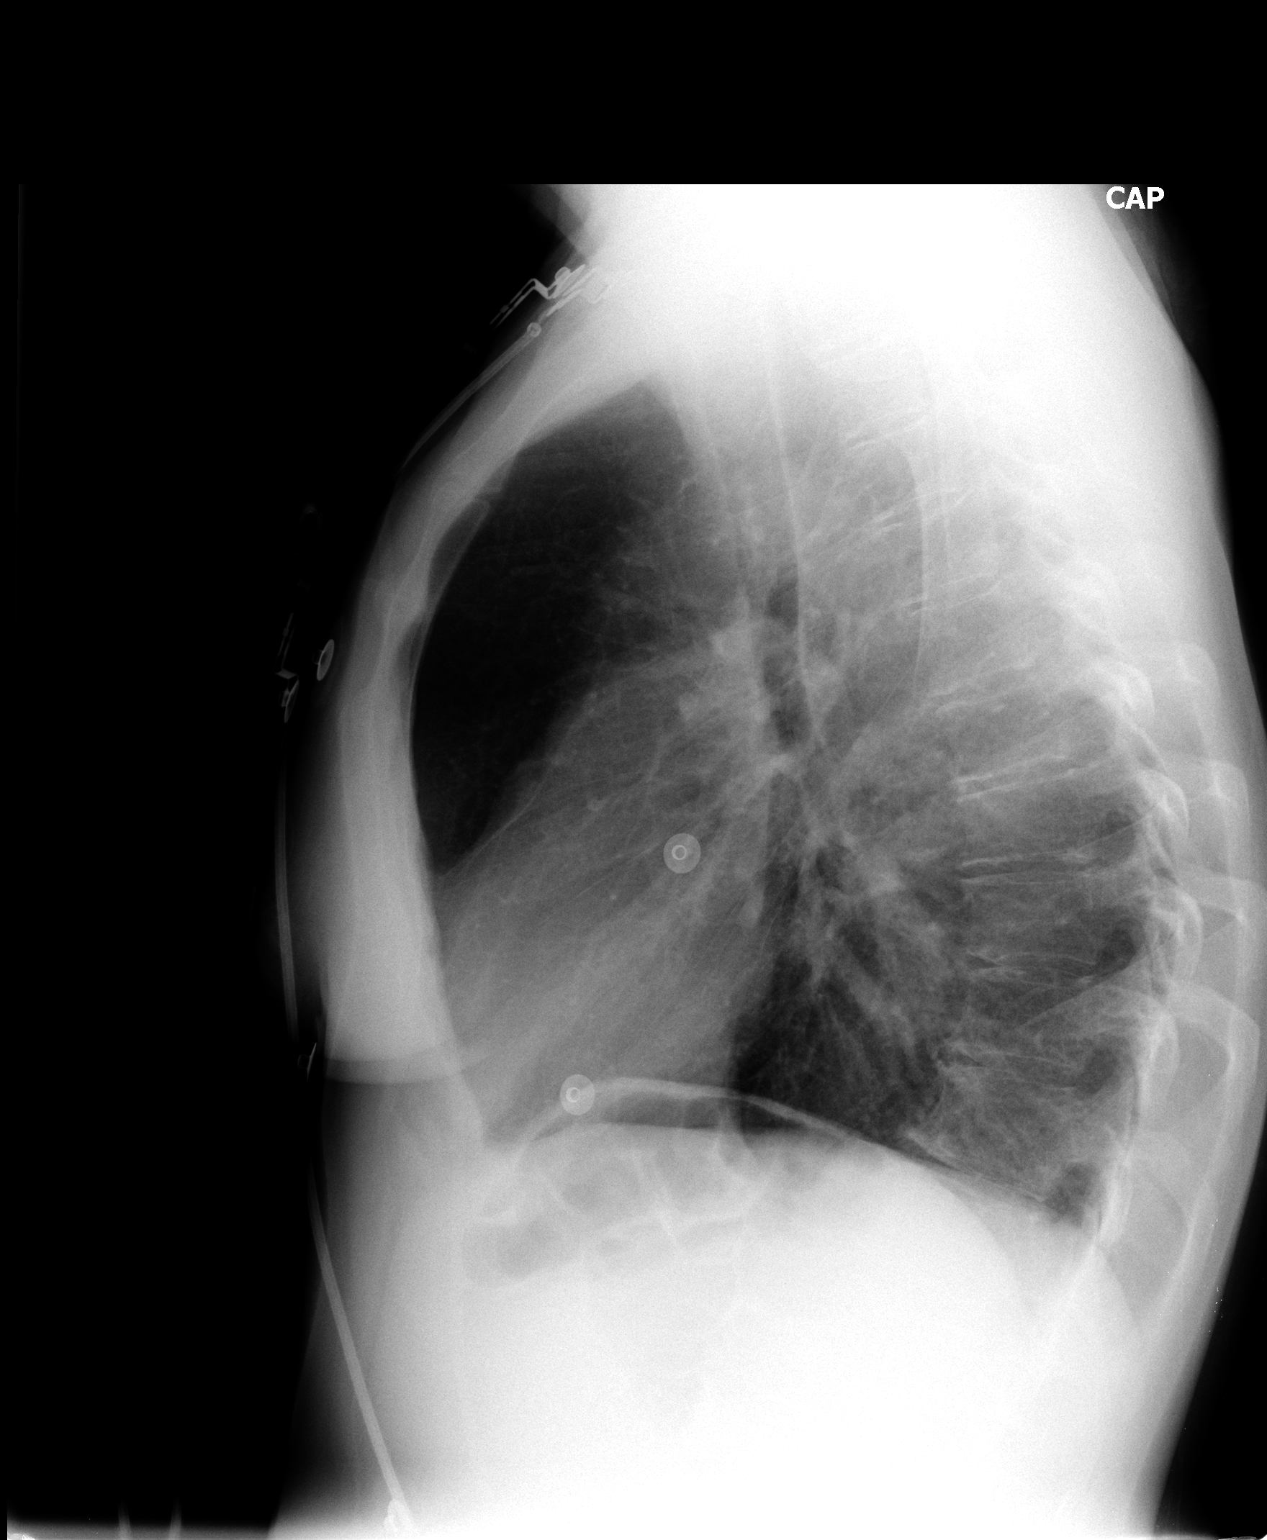

[2 of 2 positions shown; findings below may reference images not displayed]

FINDINGS: Midline trachea.  Heart size is normal.  Mediastinal contours are stable.  The left hilum is borderline prominent, similar to 6110.  No pleural fluid.  There is again hyperinflation consistent with COPD.  There is no evidence of pneumonia.  Mild thoracic kyphosis and spondylosis.
IMPRESSION: COPD, but no acute cardiopulmonary disease.

## 2008-06-10 ENCOUNTER — Emergency Department (HOSPITAL_COMMUNITY): Admission: EM | Admit: 2008-06-10 | Discharge: 2008-06-10 | Payer: Self-pay | Admitting: Emergency Medicine

## 2008-08-04 IMAGING — CR DG CHEST 1V PORT
1 series · 1 of 1 positions shown · non-contrast
Comparison: 10/05/05.

CLINICAL DATA: Chest pain.  COPD.  ?Fluttering in chest.?
 PORTABLE CHEST ? 1 VIEW:

[view not recorded]
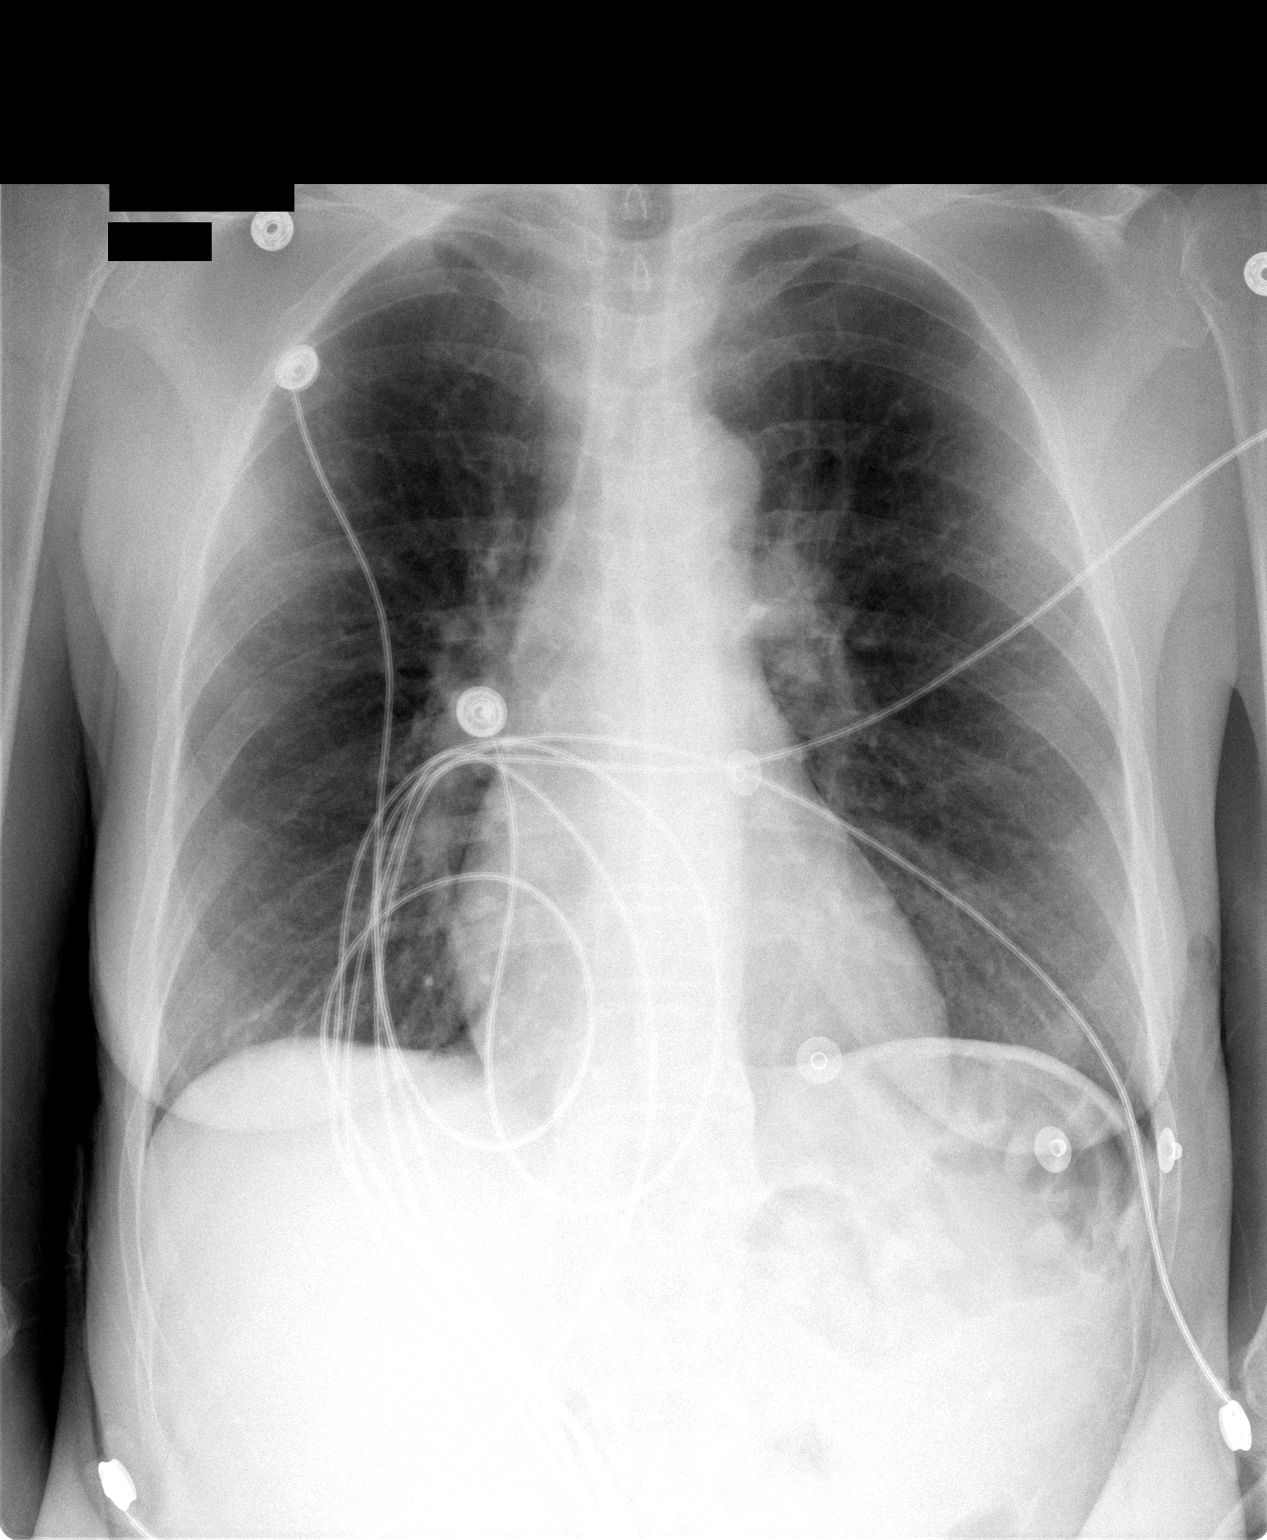

[1 of 1 positions shown; findings below may reference images not displayed]

FINDINGS: Heart size and vascularity are normal and the lungs are clear.  No significant bony abnormality.
IMPRESSION: Normal chest.

## 2008-08-04 IMAGING — CT CT HEAD W/O CM
1 of 2 series · 13 of 30 positions shown, 17 images · IV contrast (agent unspecified)
Comparison: 10/05/05.

CLINICAL DATA: Chest pain and fluttering.  
 HEAD CT WITHOUT CONTRAST:
TECHNIQUE: Contiguous axial images were obtained from the base of the skull through the vertex according to standard protocol without contrast.

[Series 2: brain · axial · 0.47mm/px · z∈[+87,+255]mm · 13 of 40 slices shown, 17 images]
[im 3/40  brain]
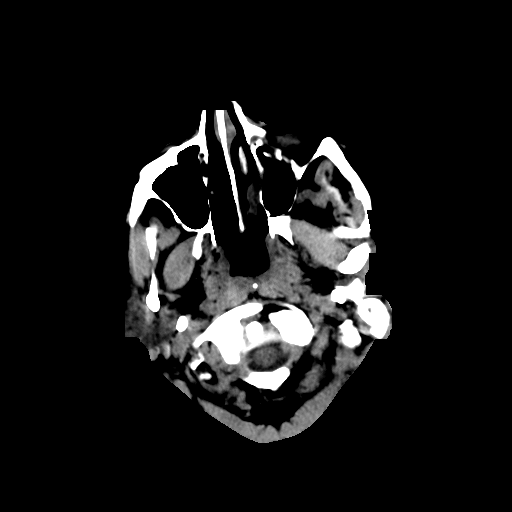
[im 3/40  bone]
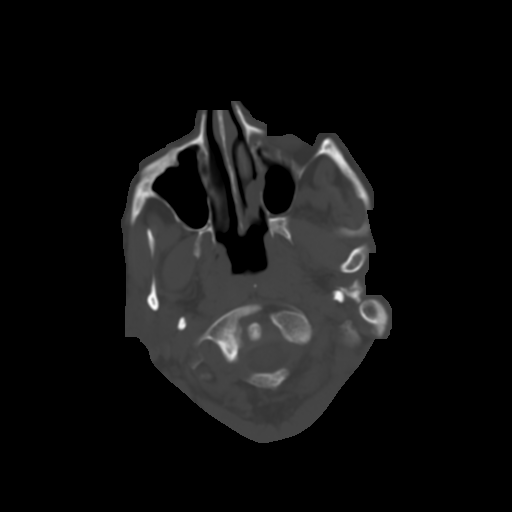
[im 6/40  brain]
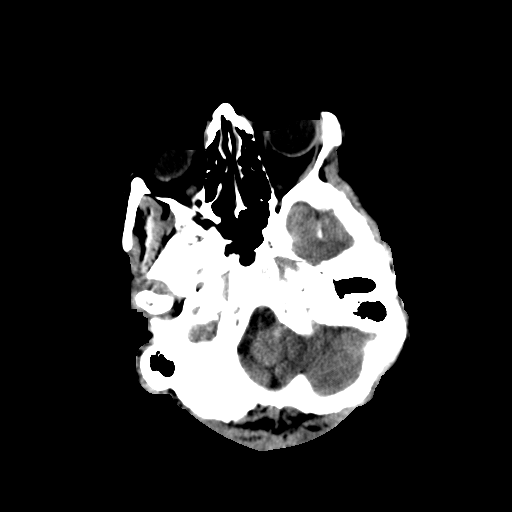
[im 9/40  brain]
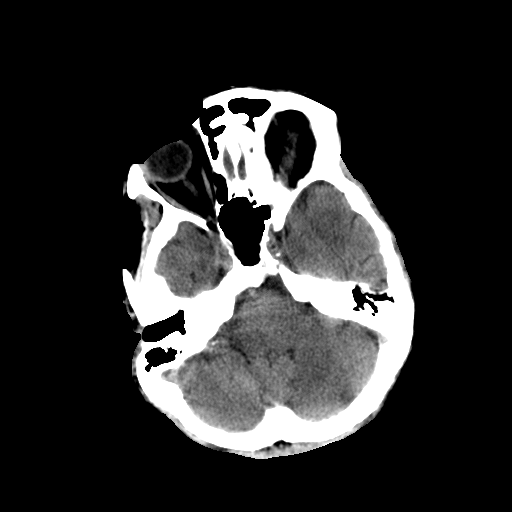
[im 12/40  brain]
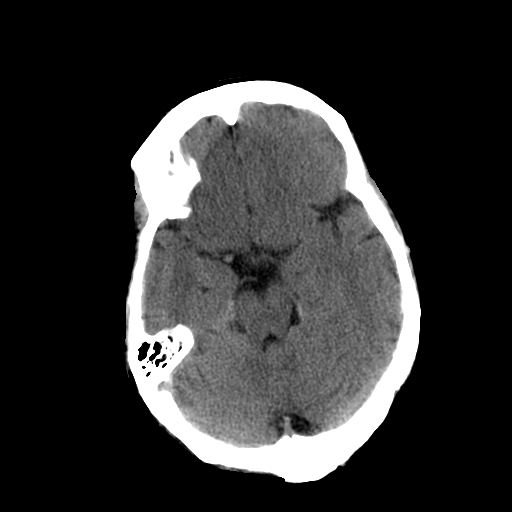
[im 14/40  brain]
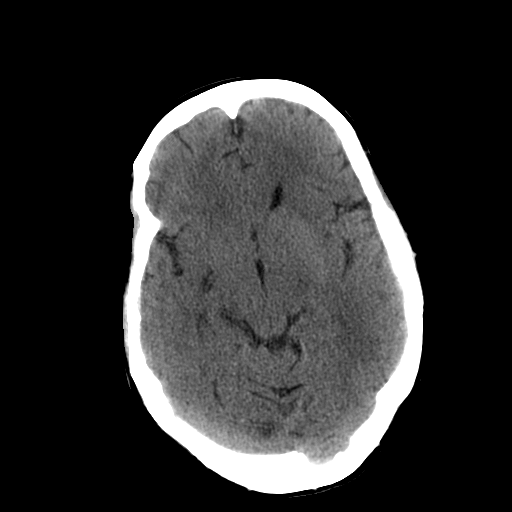
[im 14/40  bone]
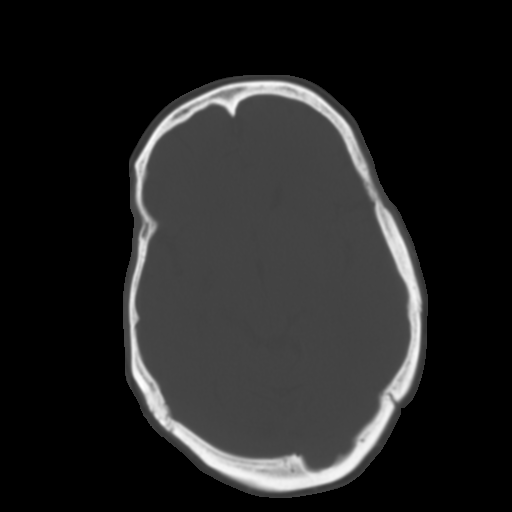
[im 17/40  brain]
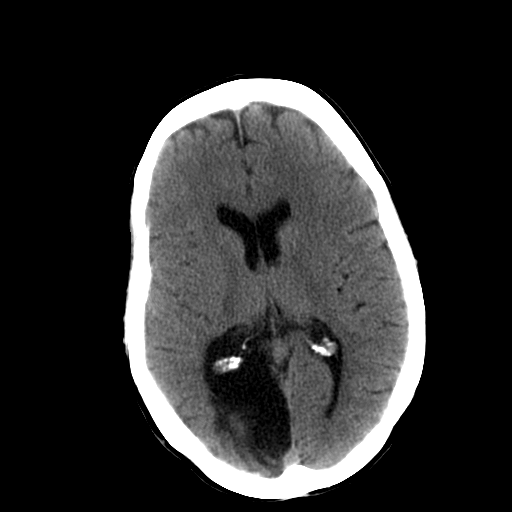
[im 20/40  brain]
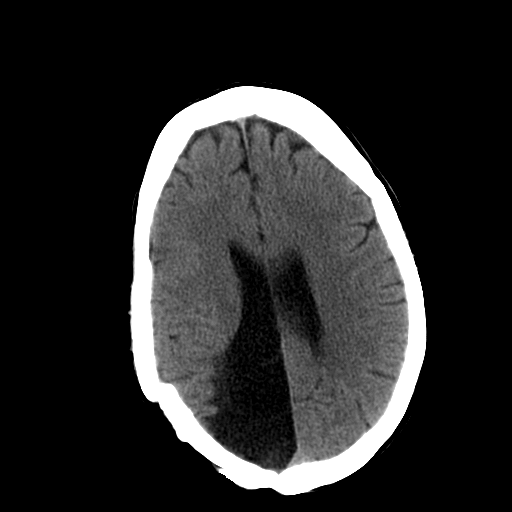
[im 23/40  brain]
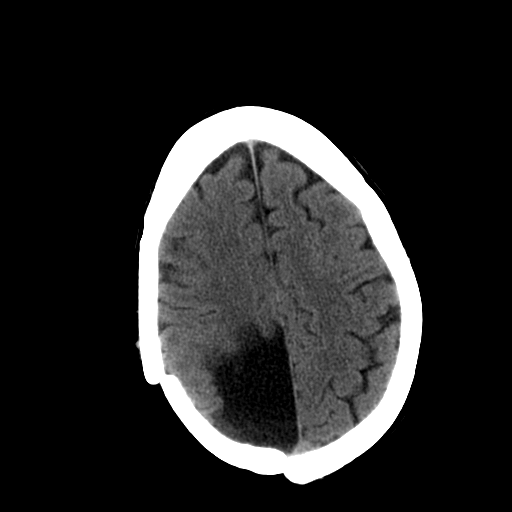
[im 26/40  brain]
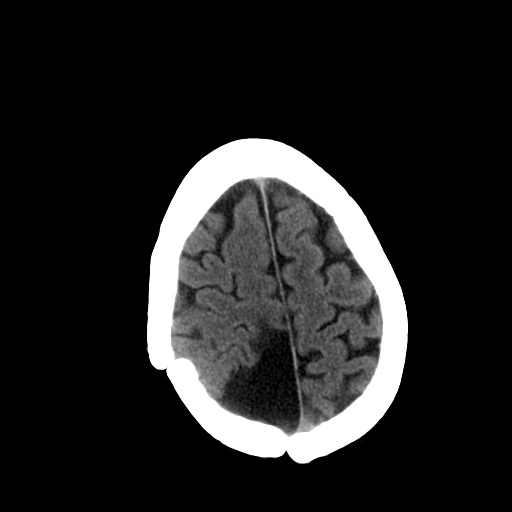
[im 26/40  bone]
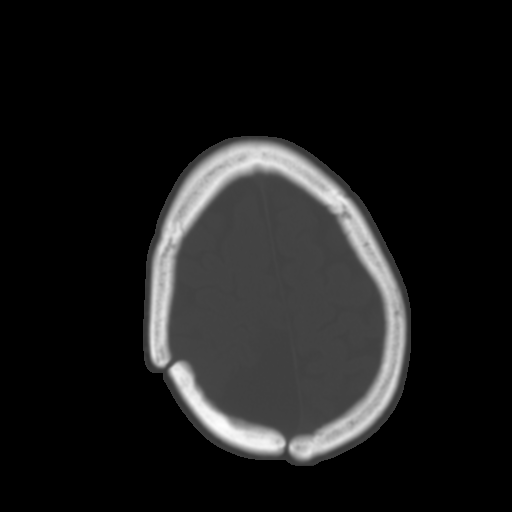
[im 28/40  brain]
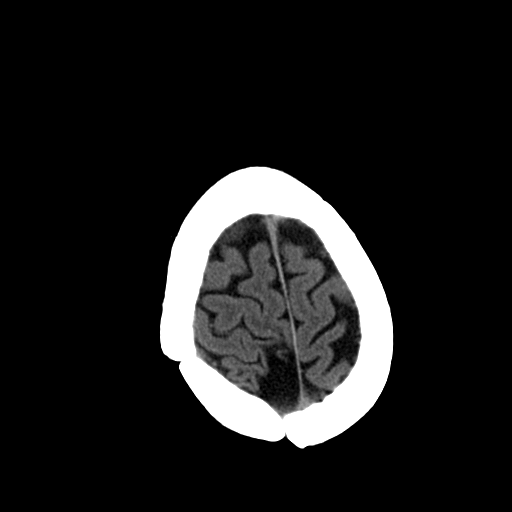
[im 31/40  brain]
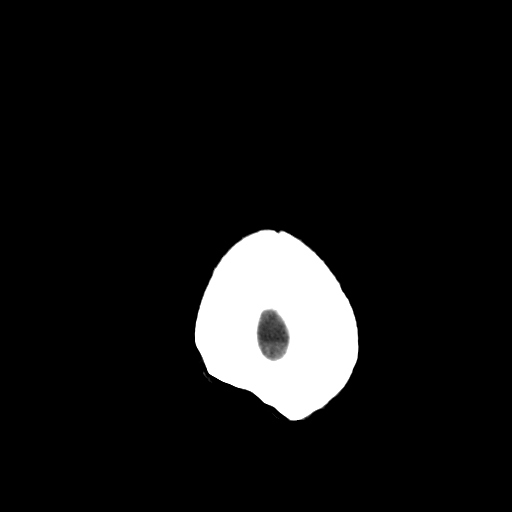
[im 34/40  brain]
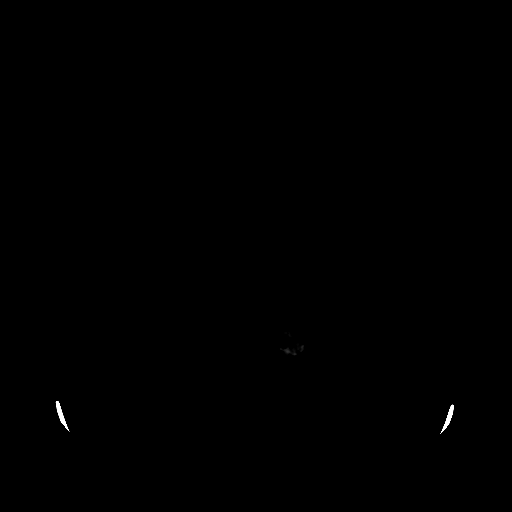
[im 37/40  brain]
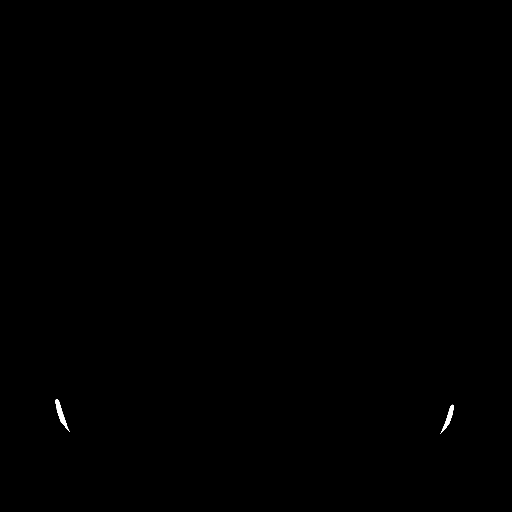
[im 37/40  bone]
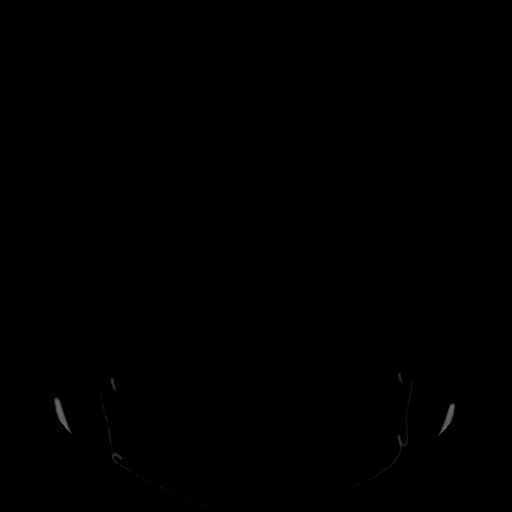

[13 of 30 positions shown; findings below may reference images not displayed]

FINDINGS: Chronic infarct in the right occipital lobe is unchanged.  There is no acute infarct or mass and there is no acute hemorrhage.  There has been prior craniotomy in the right occipitoparietal region.
IMPRESSION: Chronic right occipital infarct.  No acute abnormality and no interval change.

## 2008-12-20 ENCOUNTER — Emergency Department (HOSPITAL_COMMUNITY): Admission: EM | Admit: 2008-12-20 | Discharge: 2008-12-20 | Payer: Self-pay | Admitting: Emergency Medicine

## 2009-03-05 ENCOUNTER — Emergency Department (HOSPITAL_COMMUNITY): Admission: EM | Admit: 2009-03-05 | Discharge: 2009-03-05 | Payer: Self-pay | Admitting: Emergency Medicine

## 2009-05-26 IMAGING — CR DG CHEST 2V
2 series · 2 of 2 positions shown · non-contrast
Comparison: 06/25/06.

CLINICAL DATA: Chronic lung disease.  Dizziness.  Weakness.  Difficulty breathing. 
 CHEST - 2 VIEW:

[w chest pa]
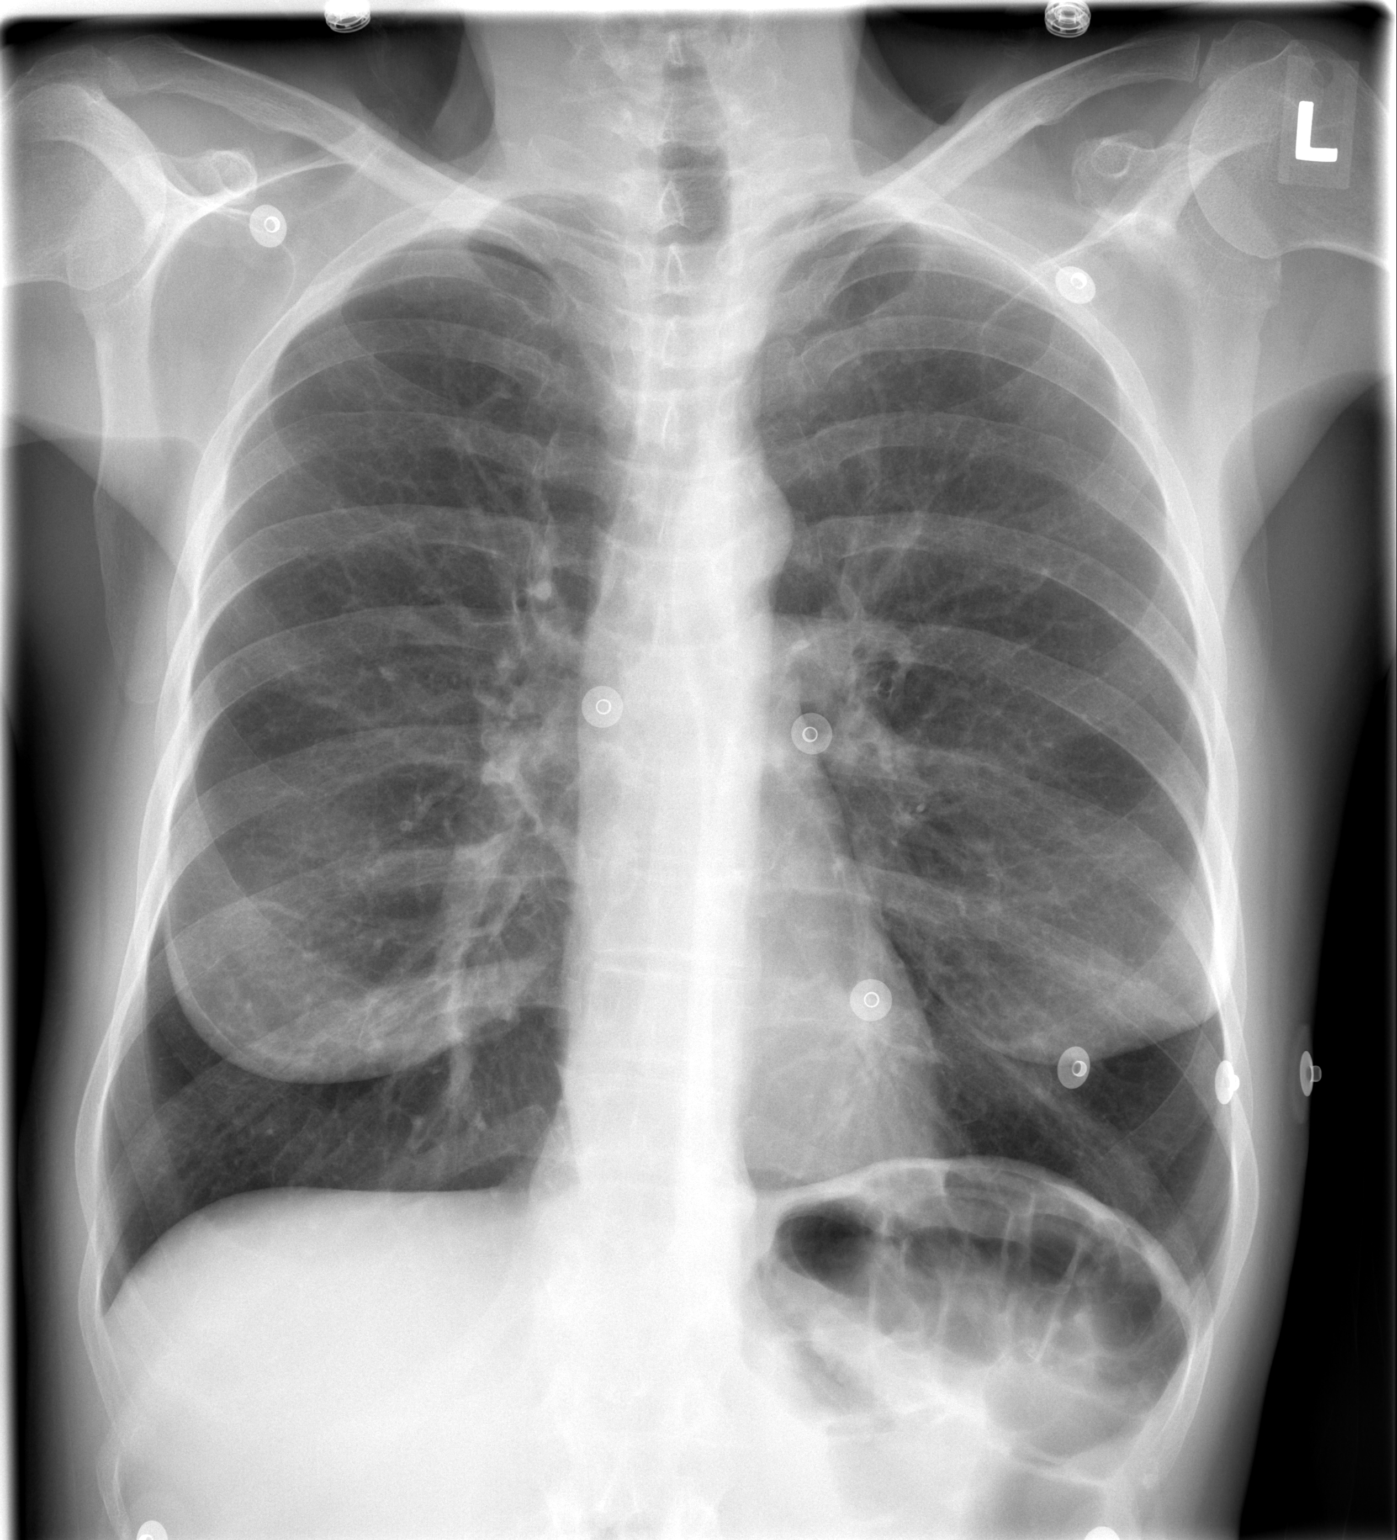

[w chest lat]
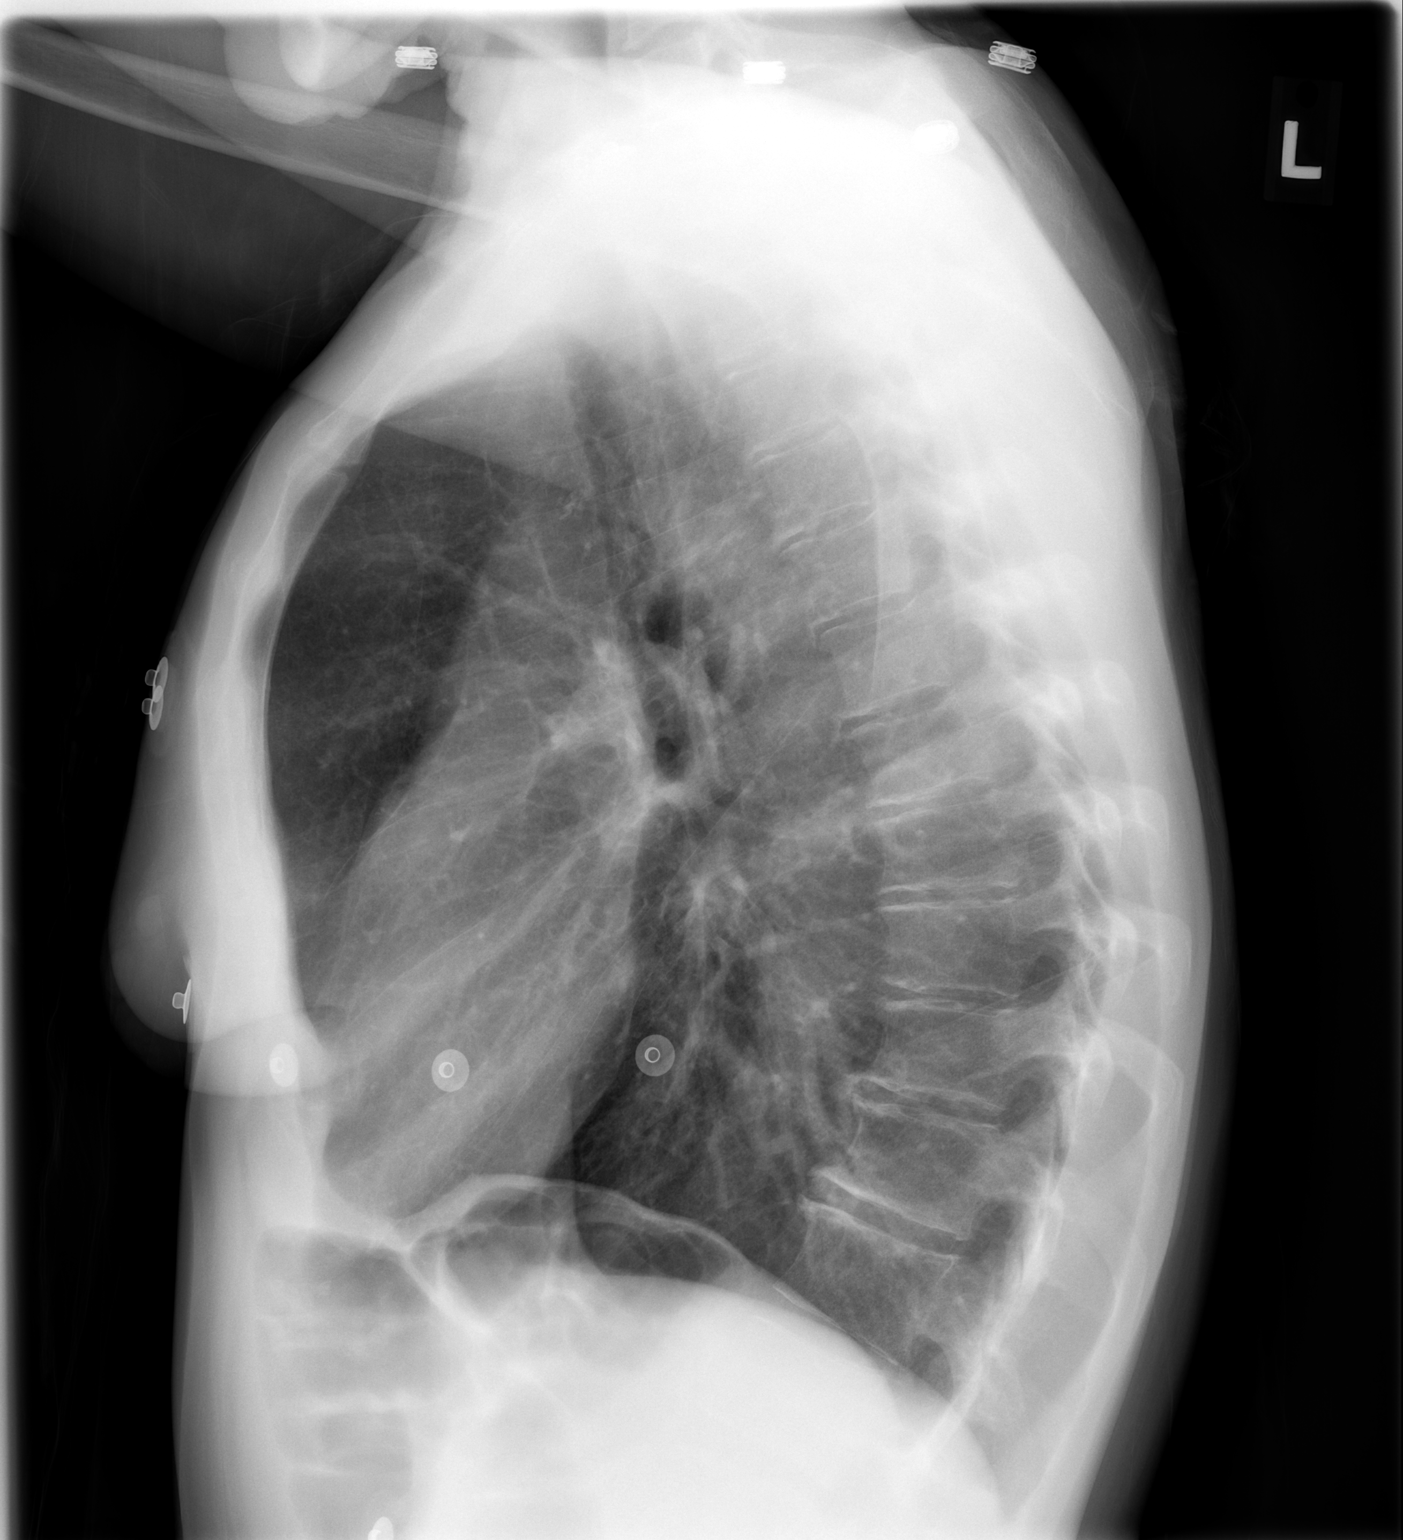

[2 of 2 positions shown; findings below may reference images not displayed]

FINDINGS: The lungs are markedly  hyperinflated consistent with emphysema.  There is no evidence of heart failure, infiltrate, effusion, or collapse.  Ordinary degenerative changes affect the spine.
IMPRESSION: Chronic, pronounced emphysematous change.  No active process.

## 2009-05-26 IMAGING — CT CT HEAD W/O CM
1 of 2 series · 13 of 30 positions shown, 17 images · IV contrast (agent unspecified)
Comparison: 06/25/06.

CLINICAL DATA: Difficulty breathing, dizziness, cough, history of cerebral aneurysm clipping.  
 HEAD CT WITHOUT CONTRAST:
TECHNIQUE: Contiguous axial images were obtained from the base of the skull through the vertex according to standard protocol without contrast.

[Series 2: brain · axial · 0.47mm/px · z∈[+126,+258]mm · 13 of 32 slices shown, 17 images]
[im 3/32  brain]
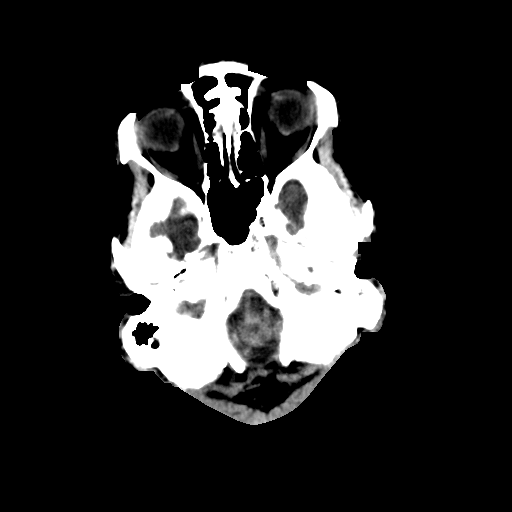
[im 3/32  bone]
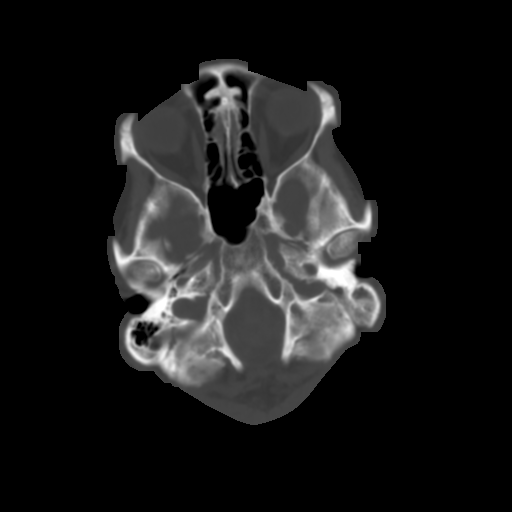
[im 5/32  brain]
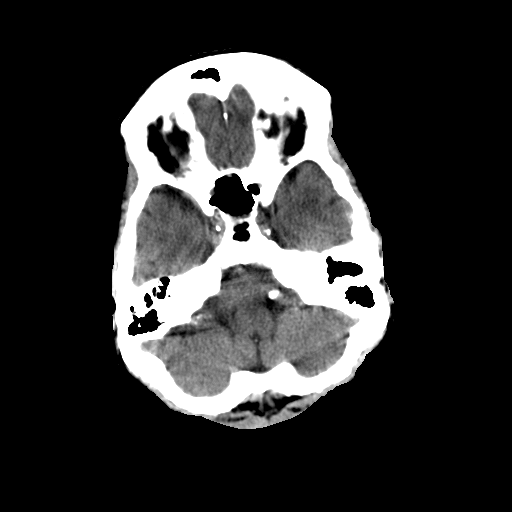
[im 7/32  brain]
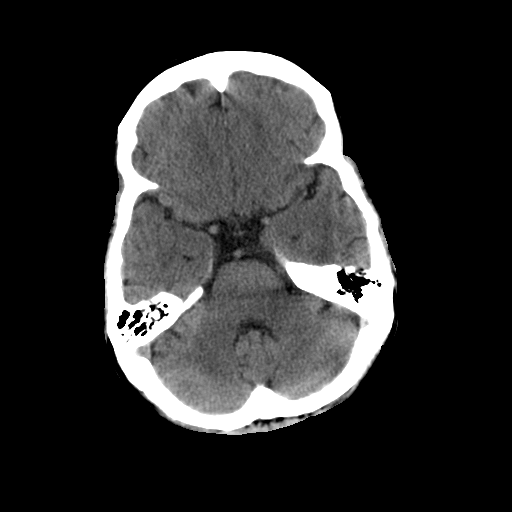
[im 9/32  brain]
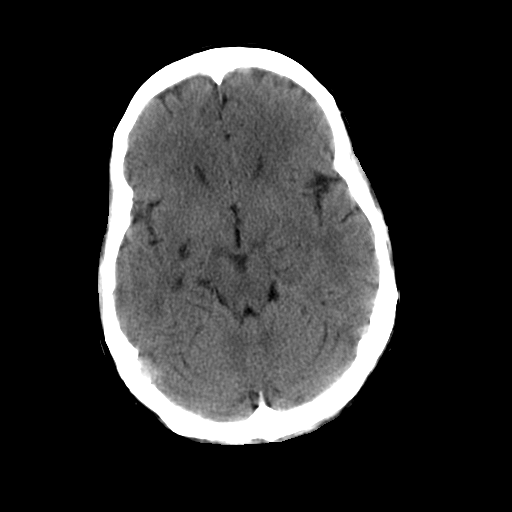
[im 12/32  brain]
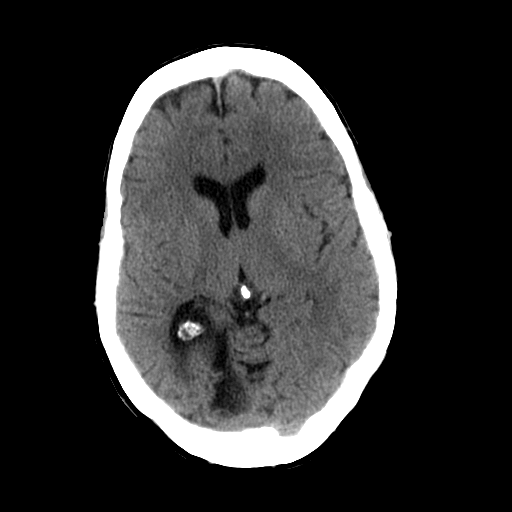
[im 12/32  bone]
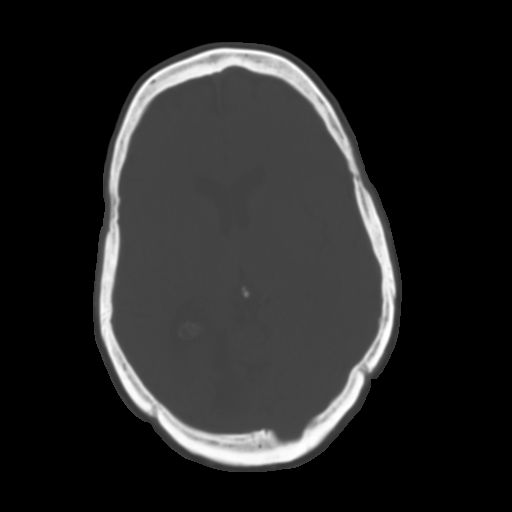
[im 14/32  brain]
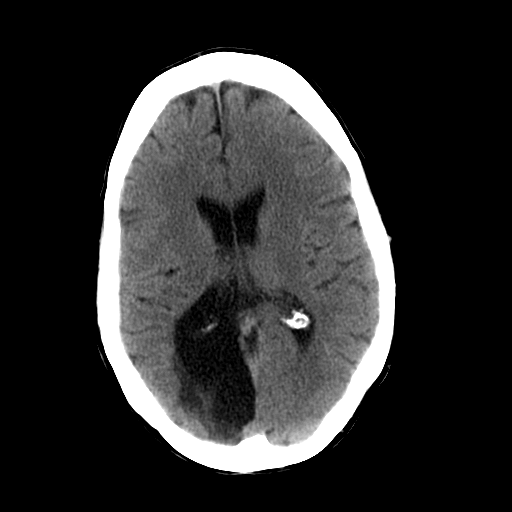
[im 16/32  brain]
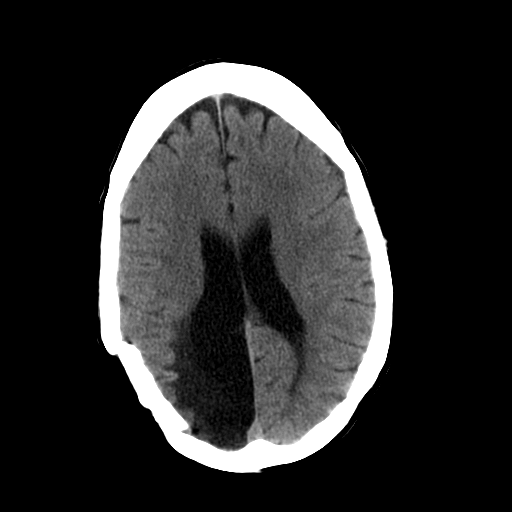
[im 18/32  brain]
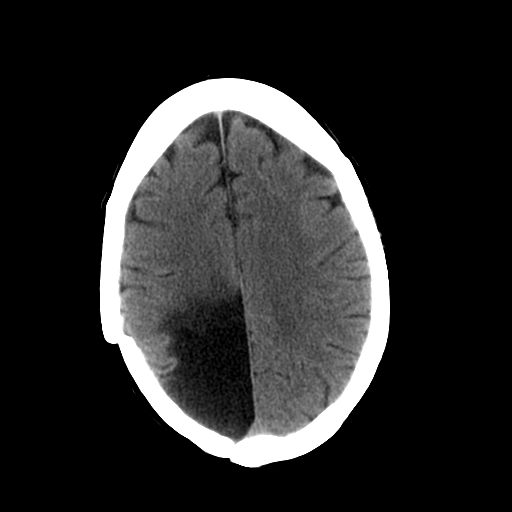
[im 20/32  brain]
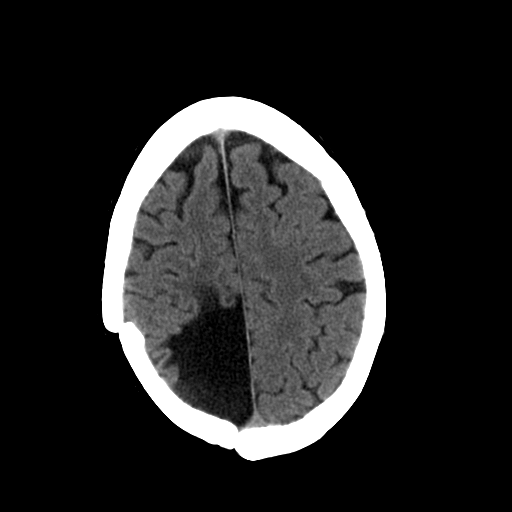
[im 20/32  bone]
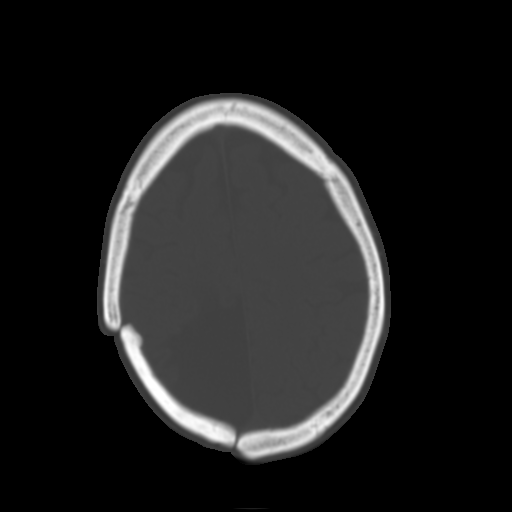
[im 23/32  brain]
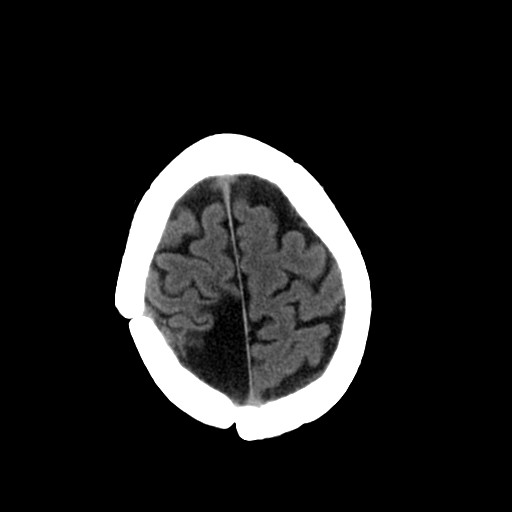
[im 25/32  brain]
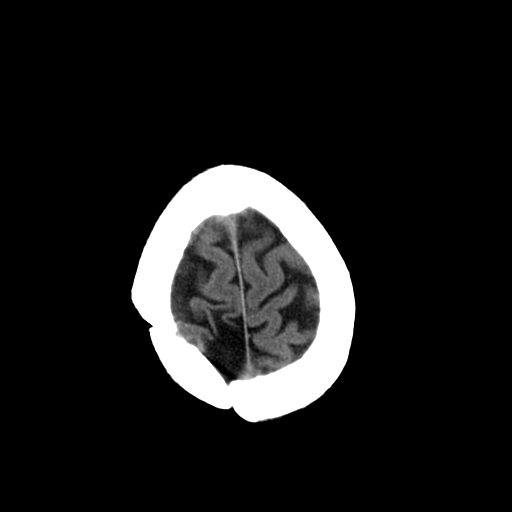
[im 27/32  brain]
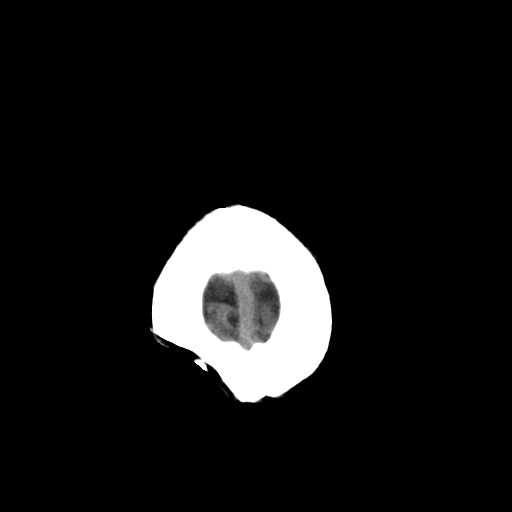
[im 29/32  brain]
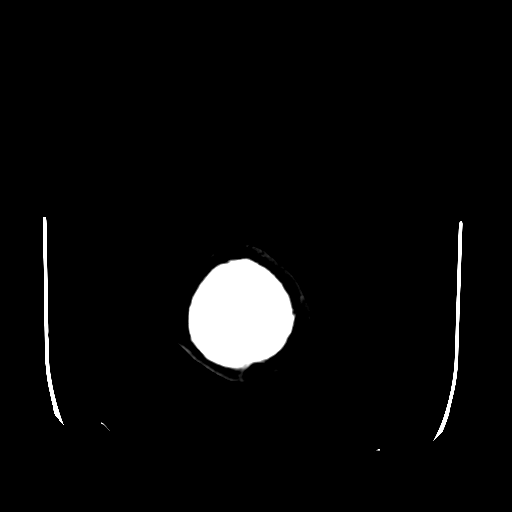
[im 29/32  bone]
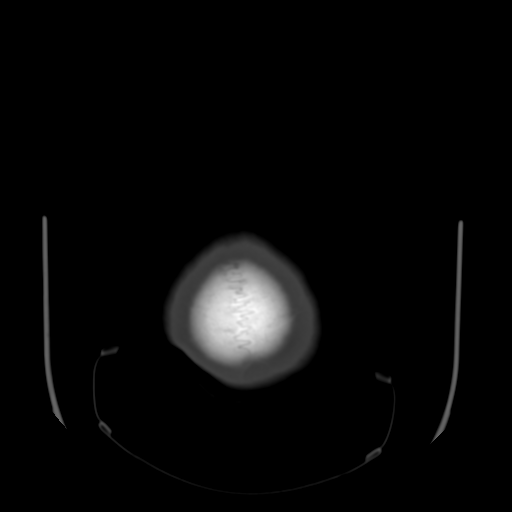

[13 of 30 positions shown; findings below may reference images not displayed]

FINDINGS: The patient has had previous right parietal craniectomy.  There atrophy with porencephaly in the right parietal occipital region.  This appears the same as it did previously.   No sign of acute insult.  No hydrocephalus.  The visualized sinuses are clear.
IMPRESSION: No change.  Postoperative changes on the right with porencephaly.  No acute findings.

## 2009-05-30 IMAGING — CT CT HEAD W/O CM
1 of 2 series · 13 of 30 positions shown, 17 images · IV contrast (agent unspecified)
Comparison: 04/16/07.

CLINICAL DATA: Seizure.  Previous right craniotomy. 
HEAD CT WITHOUT CONTRAST:
TECHNIQUE: Contiguous axial images were obtained from the base of the skull through the vertex according to standard protocol without contrast.

[Series 2: brain · axial · 0.47mm/px · z∈[+145,+276]mm · 13 of 44 slices shown, 17 images]
[im 4/44  brain]
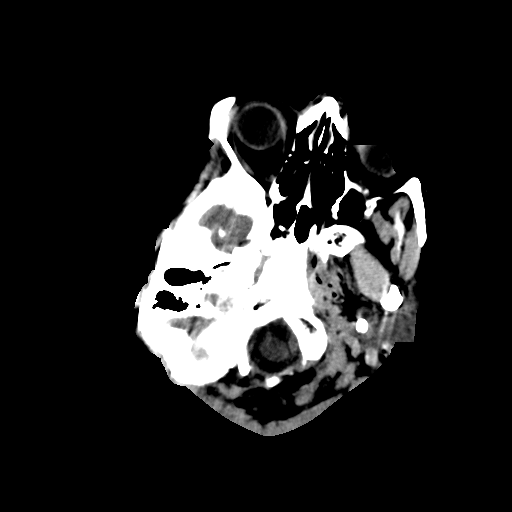
[im 4/44  bone]
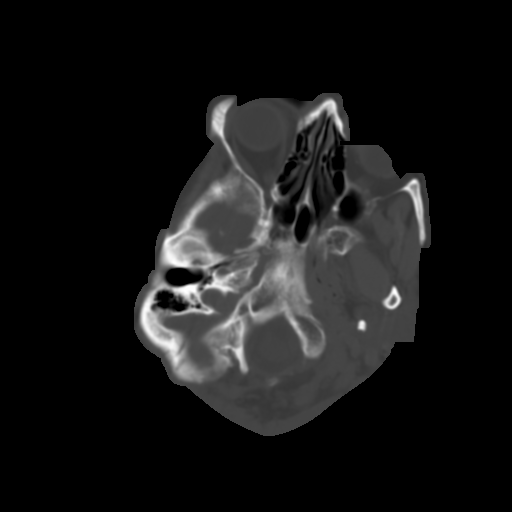
[im 7/44  brain]
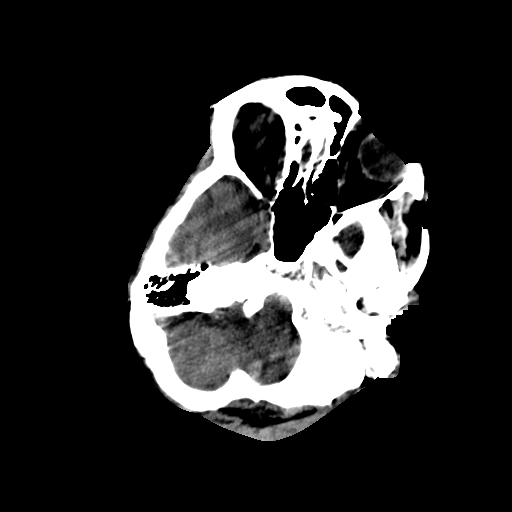
[im 10/44  brain]
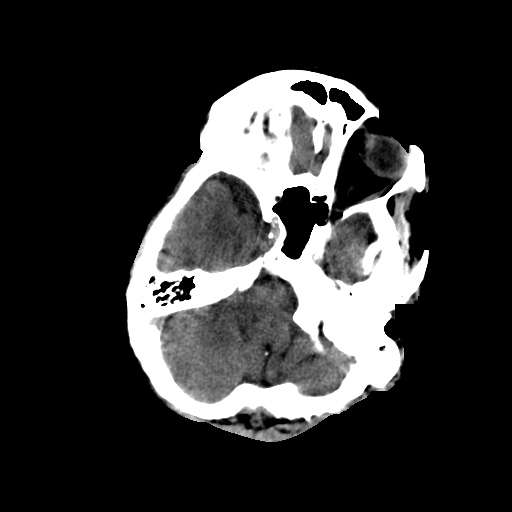
[im 13/44  brain]
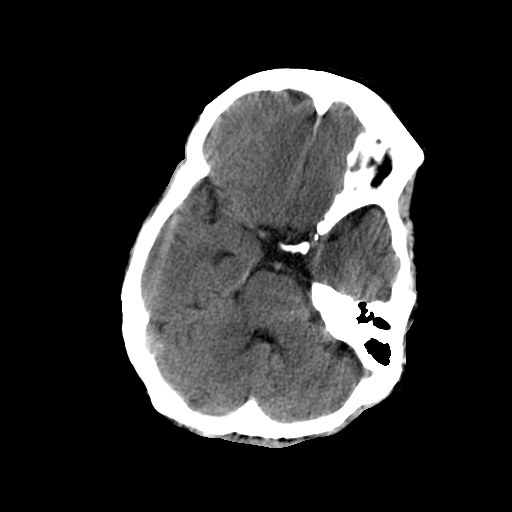
[im 16/44  brain]
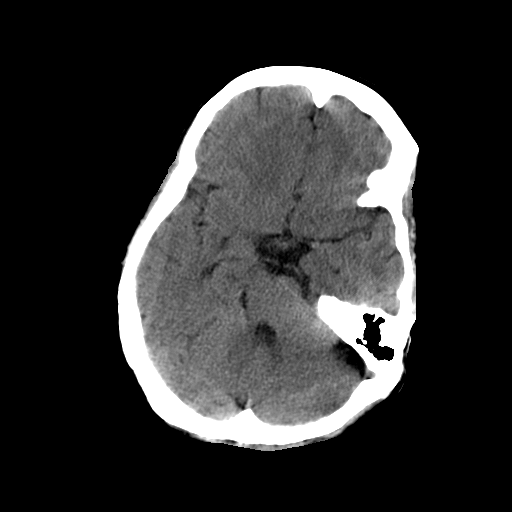
[im 16/44  bone]
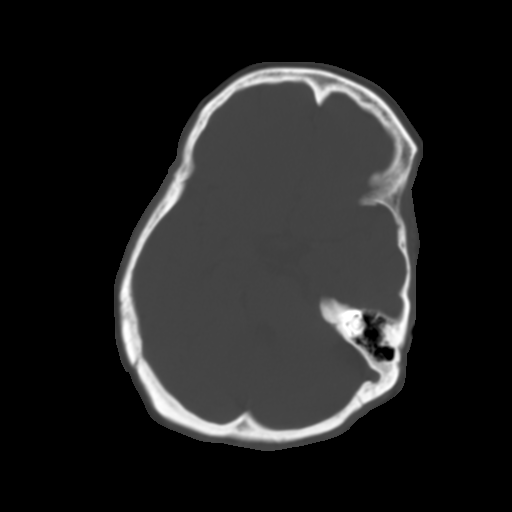
[im 19/44  brain]
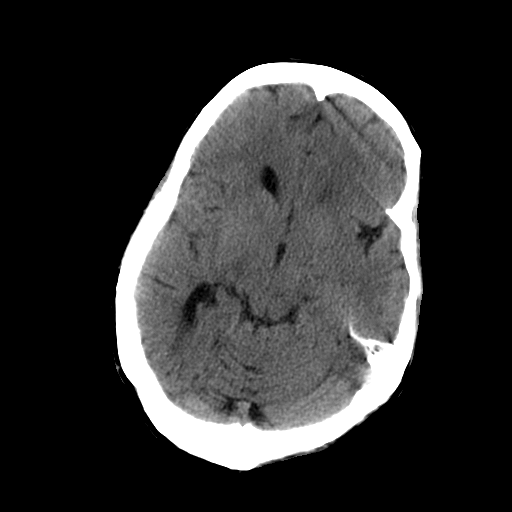
[im 22/44  brain]
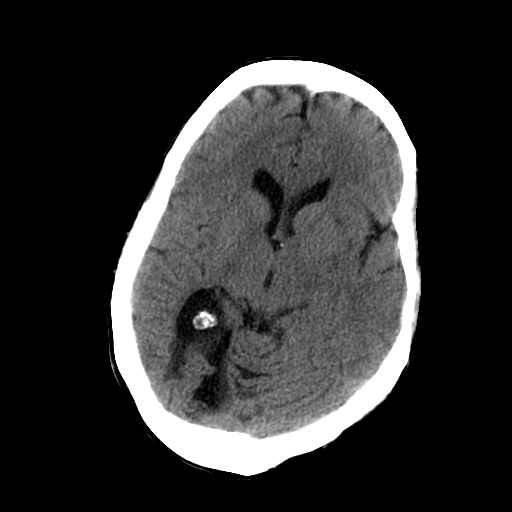
[im 25/44  brain]
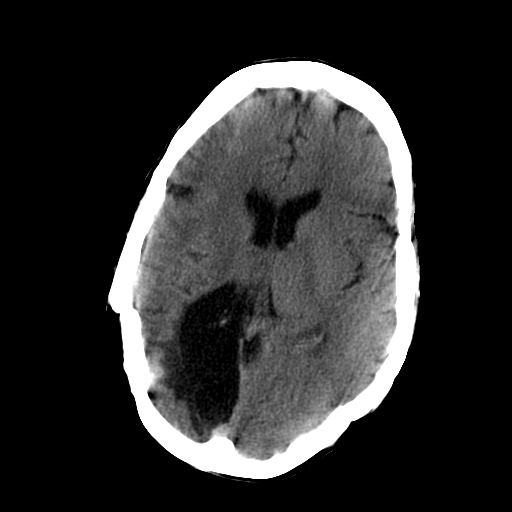
[im 28/44  brain]
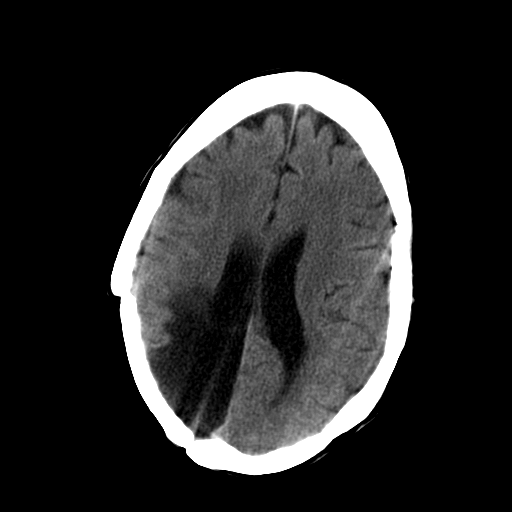
[im 28/44  bone]
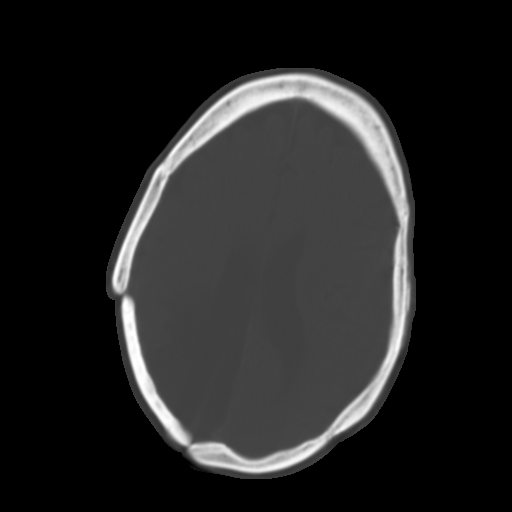
[im 31/44  brain]
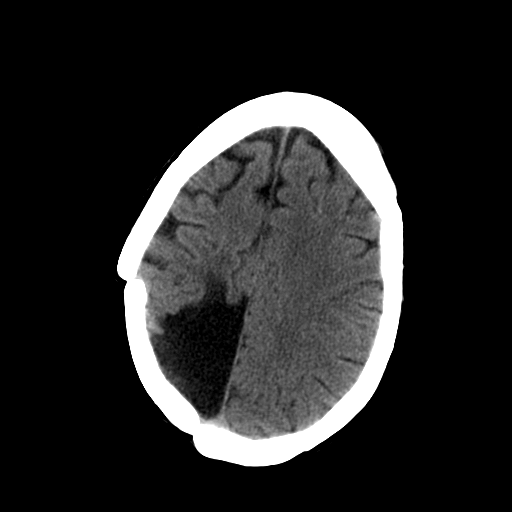
[im 34/44  brain]
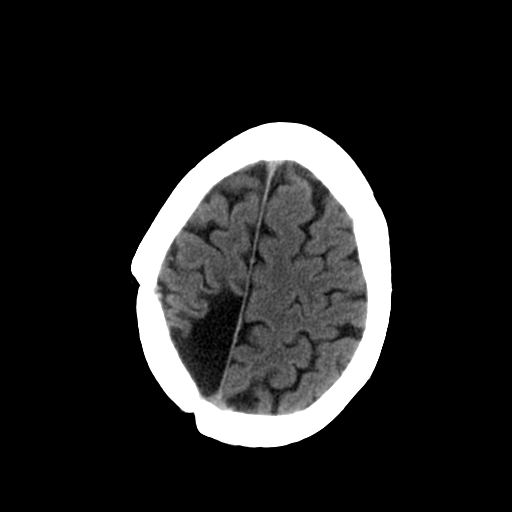
[im 37/44  brain]
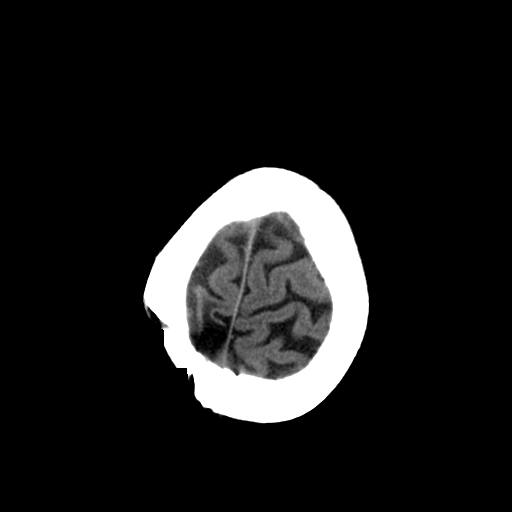
[im 40/44  brain]
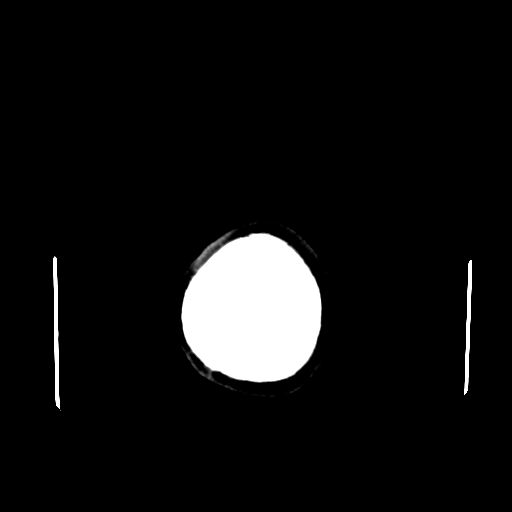
[im 40/44  bone]
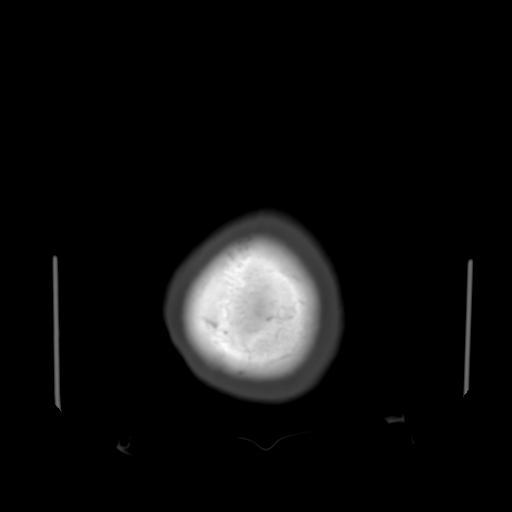

[13 of 30 positions shown; findings below may reference images not displayed]

FINDINGS: Old right posterior parietal craniotomy defect is again seen.  Porencephaly is again seen involving the right posterior parietal and occipital lobes with communication with the right lateral ventricle.  This is stable in appearance.  There is no evidence of intracranial hemorrhage, brain edema or other signs of acute infarct.  There is no evidence of intracranial mass or mass effect.  No abnormal extraaxial fluid collections are seen.
IMPRESSION: 1.  No acute intracranial findings. 
2.  Stable right posterior parietal and occipital porencephaly, and old craniotomy defect.

## 2009-05-30 IMAGING — CT CT CHEST W/ CM
4 of 7 series · 13 of 32 positions shown, 18 images · IV contrast (OMNI 300/WATER & 100 ML OMNI 300)
Comparison: CT chest 5004.

CHEST CT WITH CONTRAST

CLINICAL DATA: Seizure disorder, weight loss.
TECHNIQUE: Multidetector CT imaging of the chest, abdomen, and pelvis was
performed following the standard protocol during bolus administration of
intravenous contrast.

Contrast:  100 cc Omnipaque 300

[Series 400: reformatted · coronal · 0.70mm/px · 3 of 121 slices shown (1 of 4)]
[im 31/121  soft-tissue]
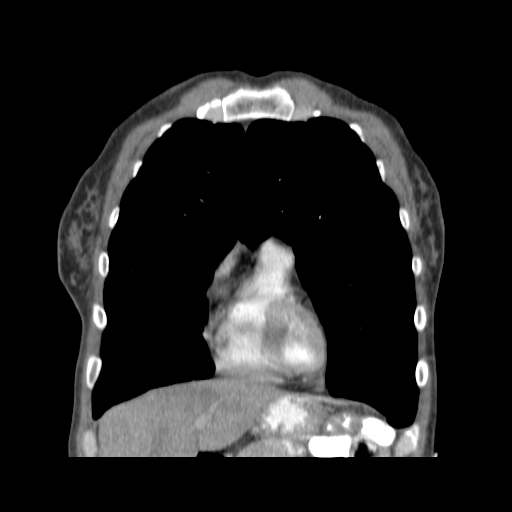
[im 61/121  soft-tissue]
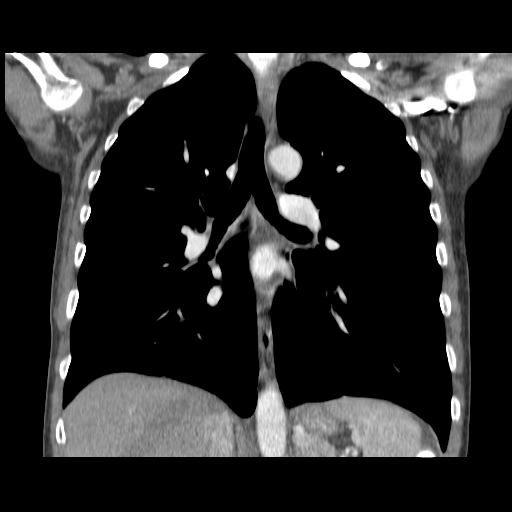
[im 91/121  soft-tissue]
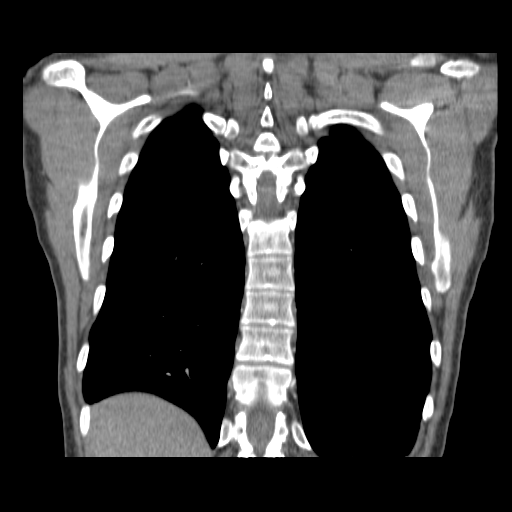

[Series 401: reformatted · sagittal · 0.70mm/px · 4 of 135 slices shown (2 of 4)]
[im 27/135  soft-tissue]
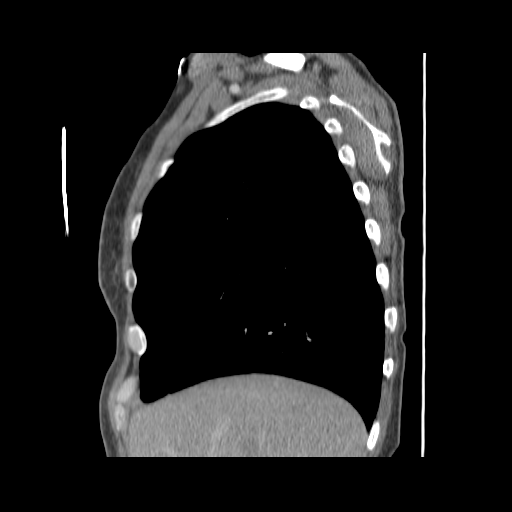
[im 54/135  soft-tissue]
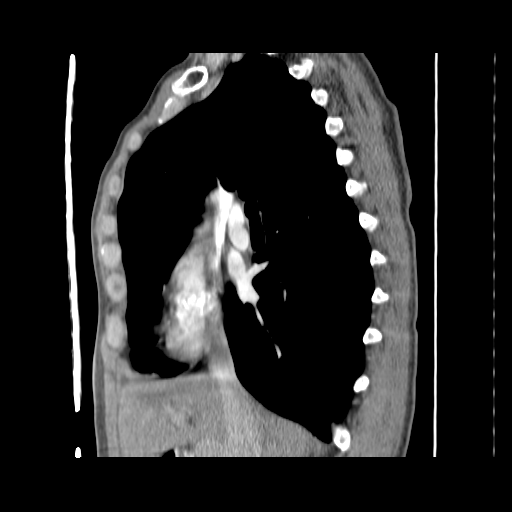
[im 81/135  soft-tissue]
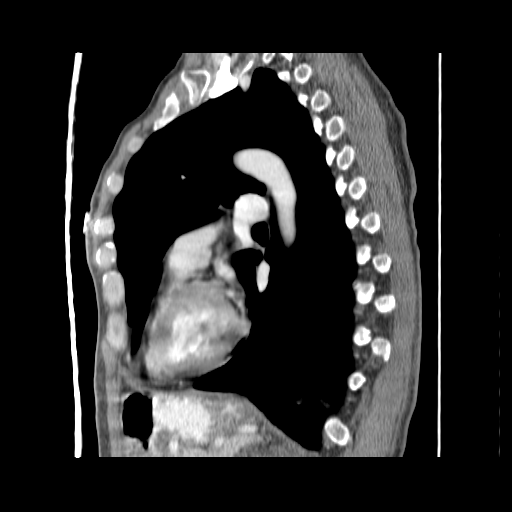
[im 108/135  soft-tissue]
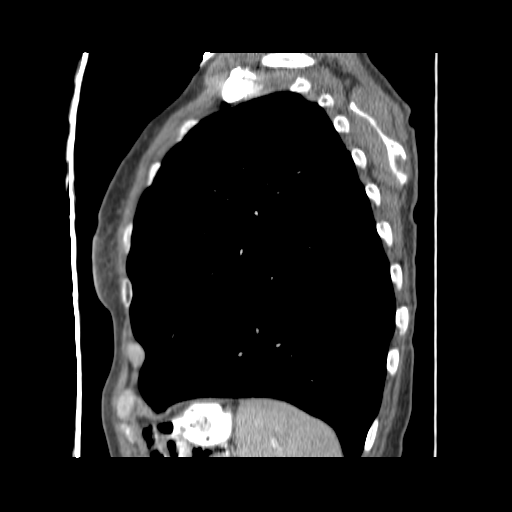

[Series 402: reformatted · coronal · 0.73mm/px · 2 of 94 slices shown (3 of 4)]
[im 32/94  soft-tissue]
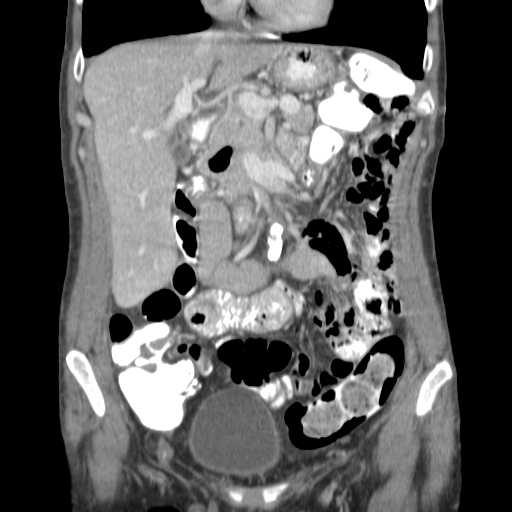
[im 63/94  soft-tissue]
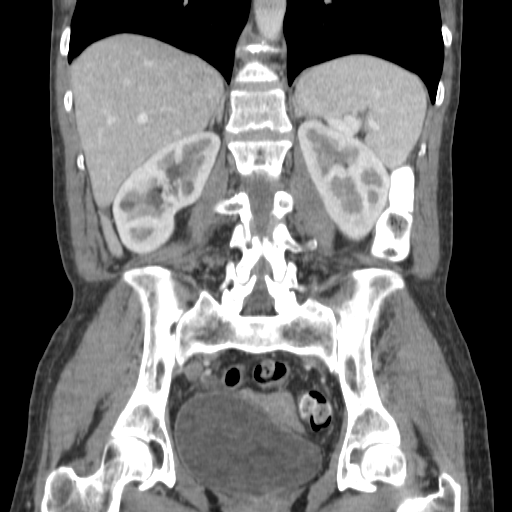

[Series 403: reformatted · sagittal · 0.73mm/px · 4 of 141 slices shown, 9 images (4 of 4)]
[im 29/141  soft-tissue]
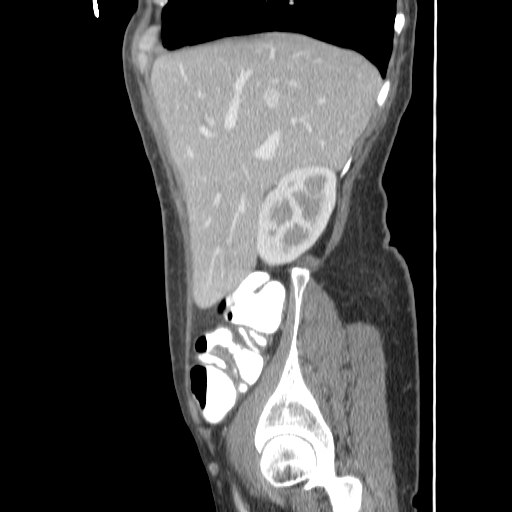
[im 29/141  lung]
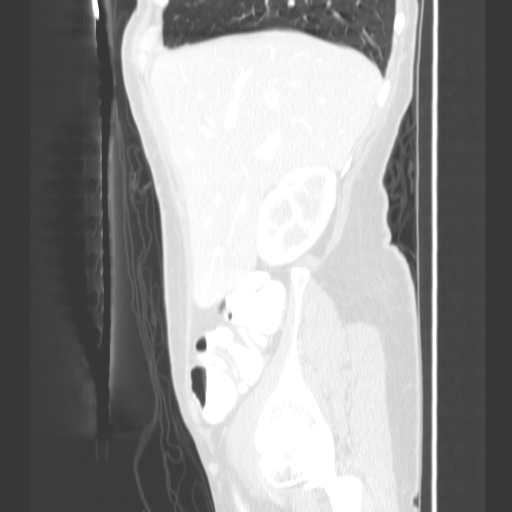
[im 29/141  bone]
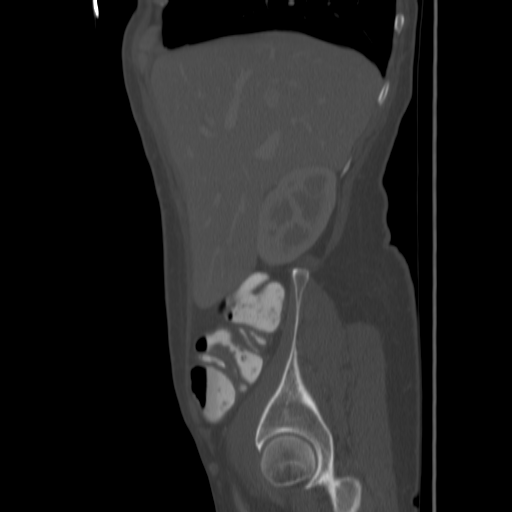
[im 57/141  soft-tissue]
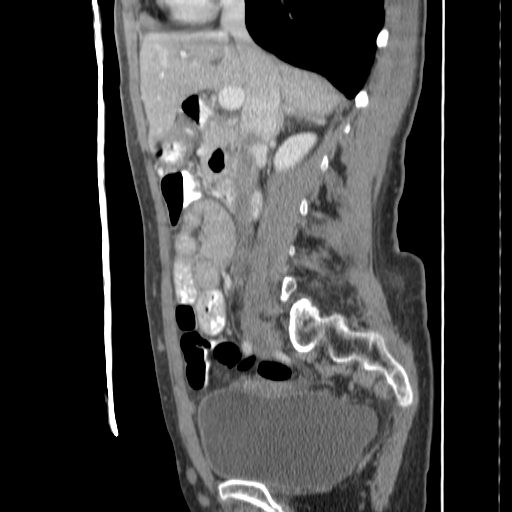
[im 57/141  lung]
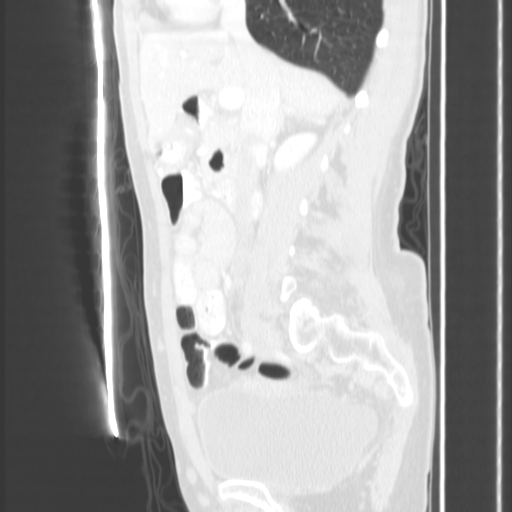
[im 85/141  soft-tissue]
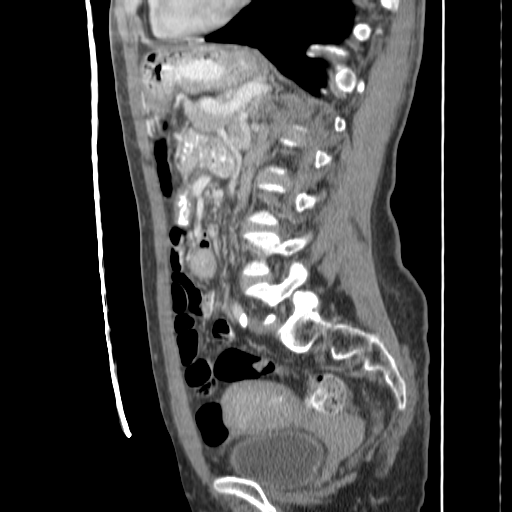
[im 85/141  lung]
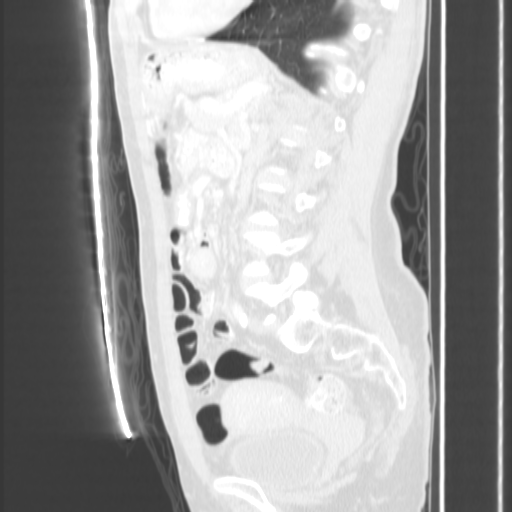
[im 113/141  soft-tissue]
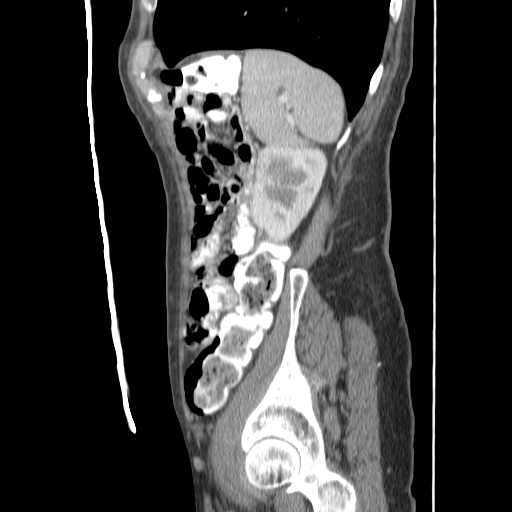
[im 113/141  lung]
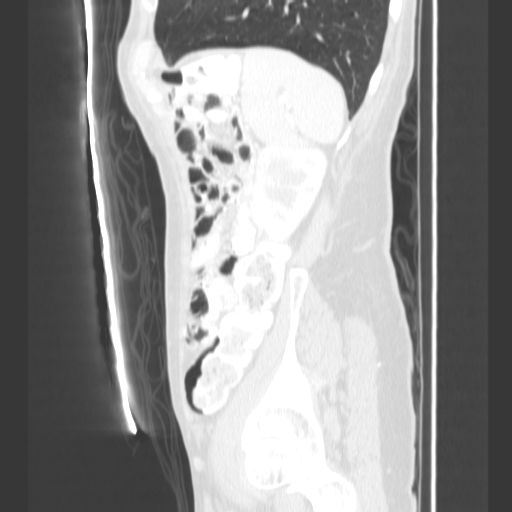

[13 of 32 positions shown; findings below may reference images not displayed]

FINDINGS: No mediastinal, axillary, or hilar lymphadenopathy. No pericardial or
pleural effusions. Great vessels and heart appear grossly normal.
Lungs clear.
Mild aortic atherosclerosis.

IMPRESSION

Negative CT chest.

ABDOMEN CT WITH CONTRAST
FINDINGS: Tiny, too small to characterize low density lesions in the hepatic
dome. Pancreas shows a 2.4 x 0.7 cm air collection within the pancreatic head
anterior to the common bile duct. No inflammatory changes are present
surrounding this air collection. This may represent a periampullary duodenal
diverticulum.
Aortic atherosclerosis without aneurysm.
1 cm right interpolar renal cyst, too small to characterize. Right upper pole
too small to characterize renal low density lesion, likely cysts.
Delayed renal images show good excretion of contrast.
Riedel's lobe of liver.

IMPRESSION
1. No acute intra-abdominal abnormalities.
2. Air collection in the head of pancreas, probably representing air filled
diverticulum or less likely foregut duplication cyst.
3. Atherosclerosis.
4. Low density lesions right kidney, too small to definitively characterize,
statistically likely representing cysts.

PELVIS CT WITH CONTRAST
FINDINGS: Normal appendix.
Uterus and adnexa appear within normal limits.
No significant lymphadenopathy.

IMPRESSION

Negative CT pelvis.

## 2009-07-05 IMAGING — CR DG ABDOMEN 1V
1 series · 1 of 1 positions shown · non-contrast
Comparison: none

HISTORY: Seizure, fever

ABDOMEN ONE VIEW:
No prior exam for comparison.
Air filled nondistended colon.
Small bowel gas pattern normal.
Small pelvic phleboliths.
Bony demineralization.
No bowel dilatation or evidence of obstruction.

[view not recorded]
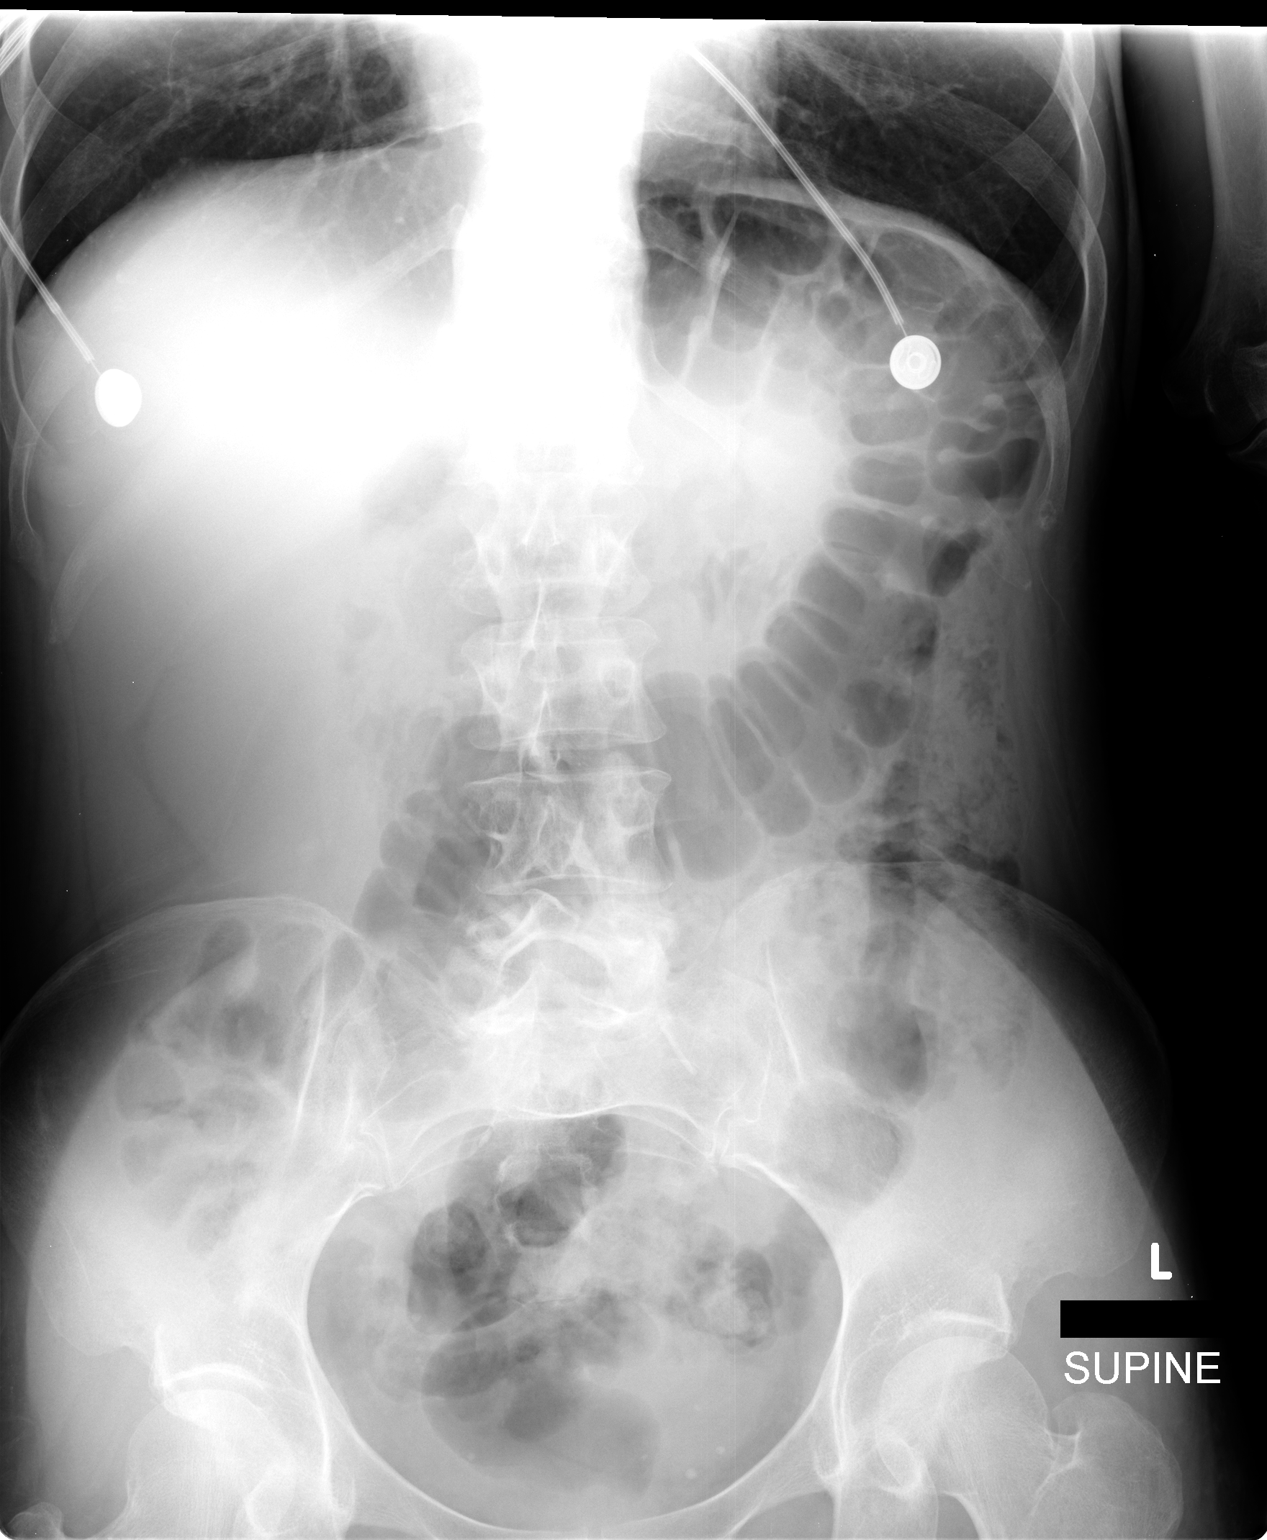

[1 of 1 positions shown; findings below may reference images not displayed]

IMPRESSION: No acute abnormalities.

## 2009-07-05 IMAGING — CR DG CHEST 1V PORT
1 series · 1 of 1 positions shown · non-contrast
Comparison: 04/20/07.

CLINICAL DATA: Seizure and pain. 
 PORTABLE CHEST - 1 VIEW ? 05/26/07:

[view not recorded]
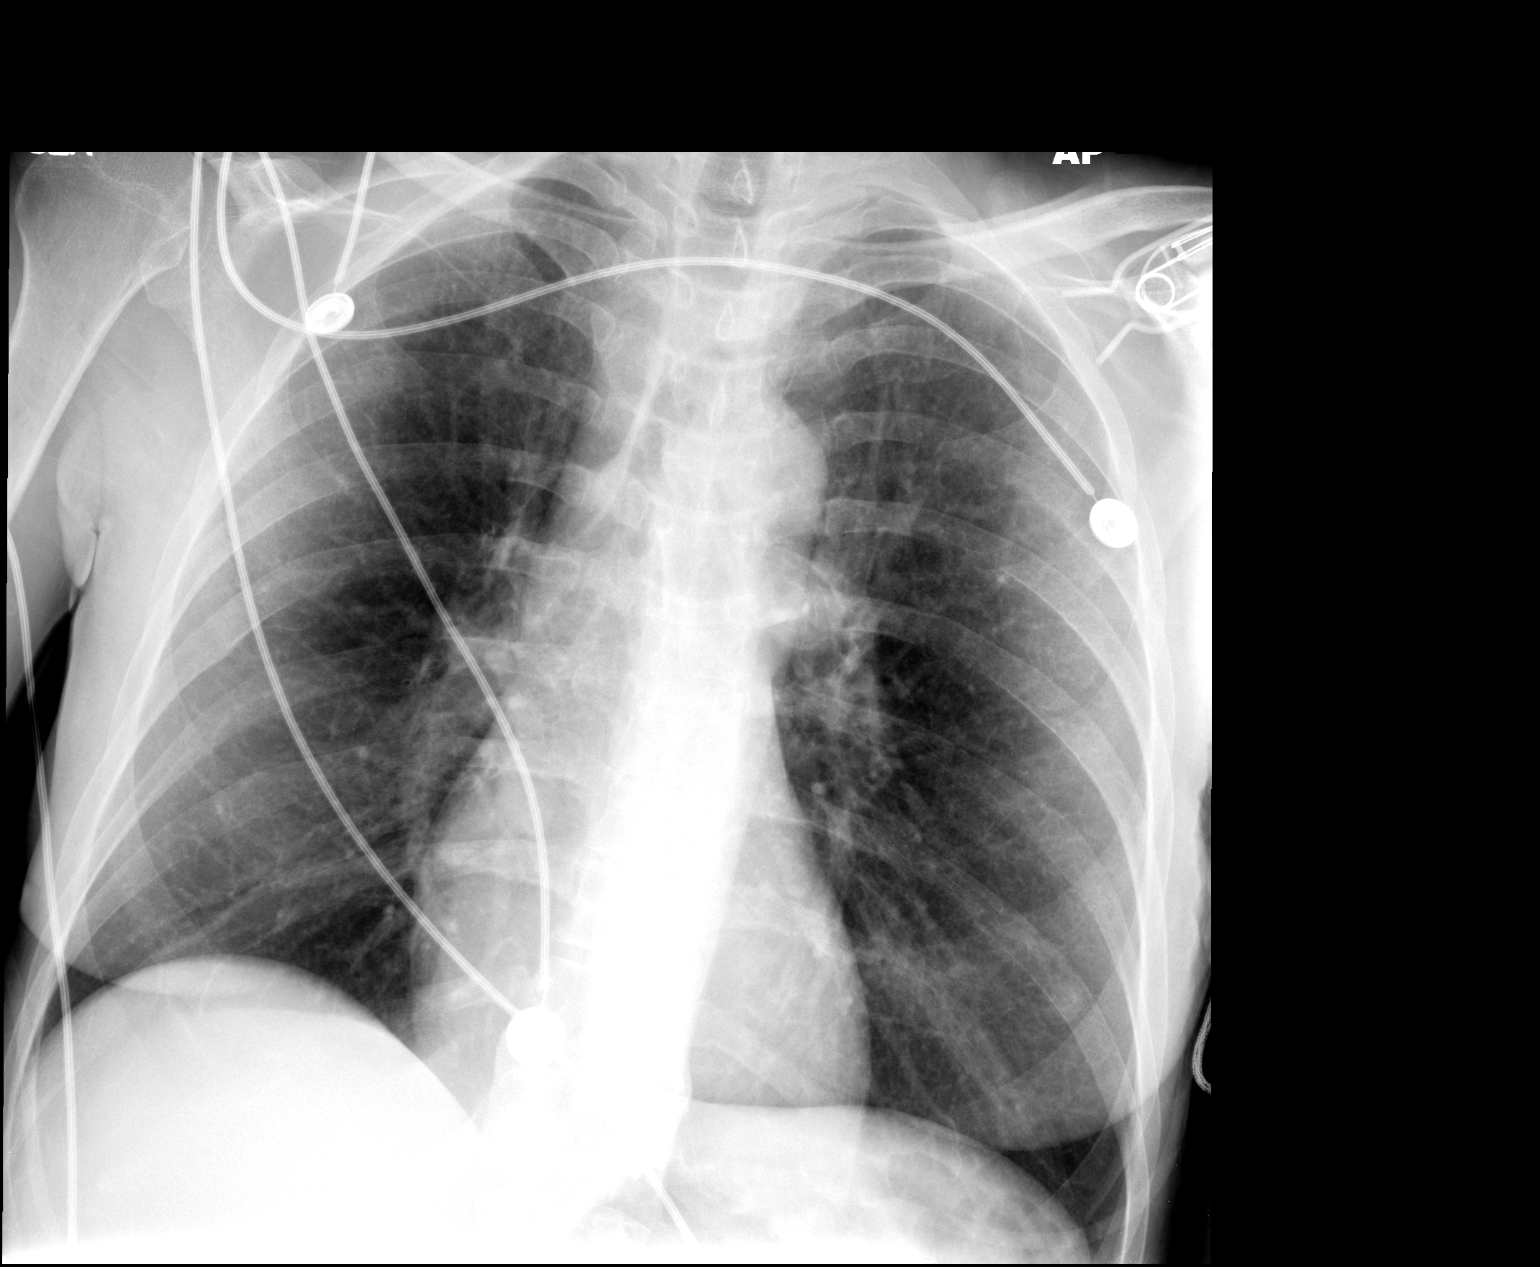

[1 of 1 positions shown; findings below may reference images not displayed]

FINDINGS: The lungs are normally expanded and clear.   The heart and mediastinal contours are within normal limits . A nipple shadow is noted on the left.
IMPRESSION: No evidence of acute cardiopulmonary disease.

## 2009-07-07 IMAGING — CT CT ANGIO CHEST
2 of 4 series · 19 of 36 positions shown · IV contrast (omnipaque)
Comparison: 04/20/07.

CLINICAL DATA: Chest pain.  51-year-old female. 
CT ANGIOGRAPHY OF CHEST:
TECHNIQUE: Multidetector CT imaging of the chest was performed during bolus injection of intravenous contrast.  Multiplanar CT angiographic image reconstructions were generated to evaluate the vascular anatomy.
Contrast:  80 cc Omnipaque 300

[Series 5: pulm embolism 2.0 b31f st · axial · 0.63mm/px · z∈[-382,-86]mm · 16 of 162 slices shown]
[im 7/162  lung]
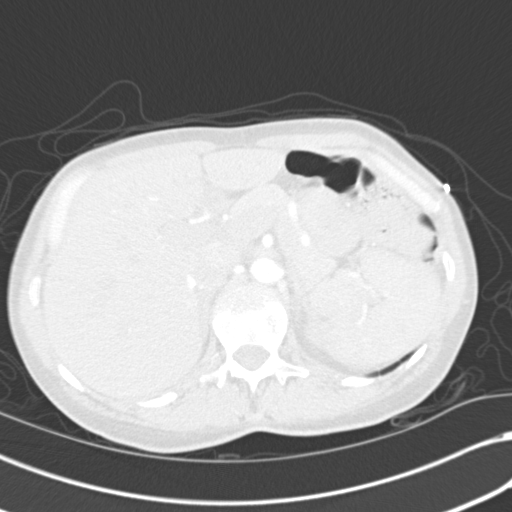
[im 19/162  mediastinal]
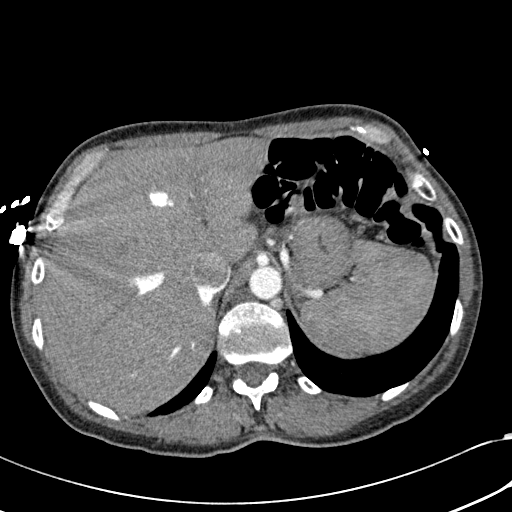
[im 25/162  lung]
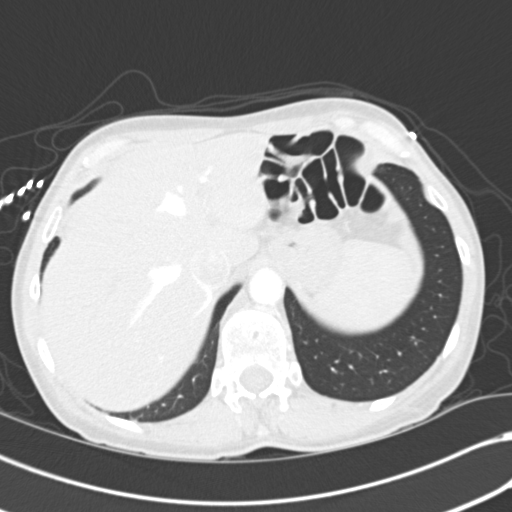
[im 38/162  mediastinal]
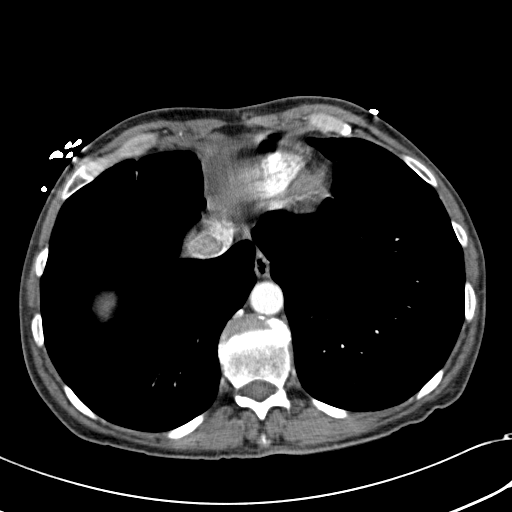
[im 50/162  lung]
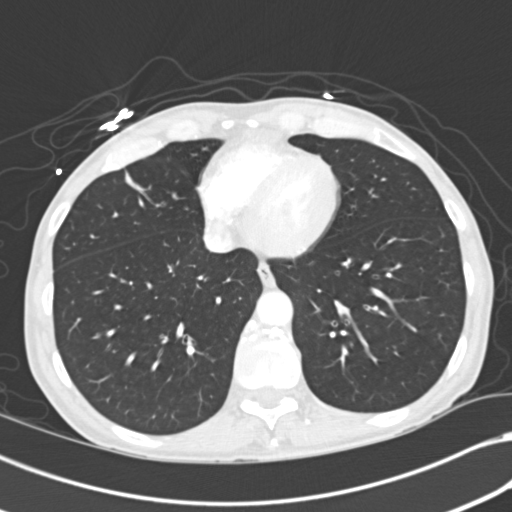
[im 56/162  mediastinal]
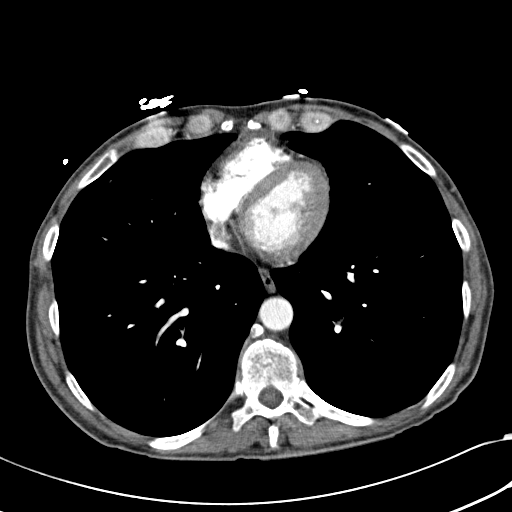
[im 69/162  lung]
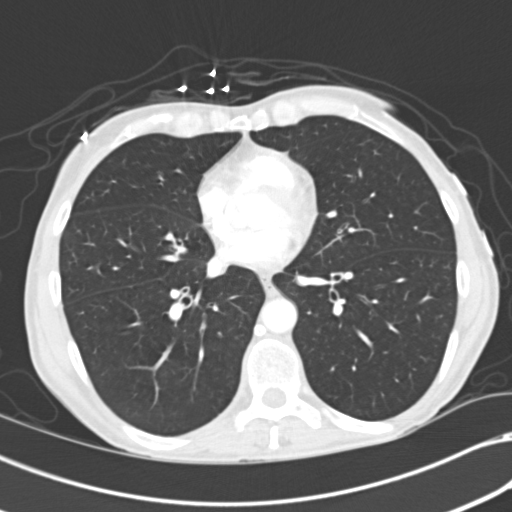
[im 75/162  mediastinal]
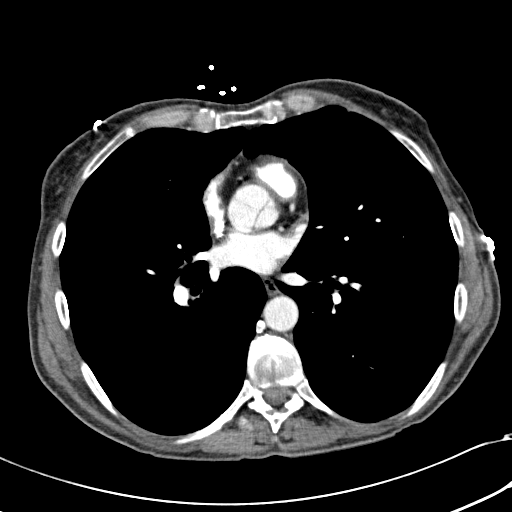
[im 87/162  lung]
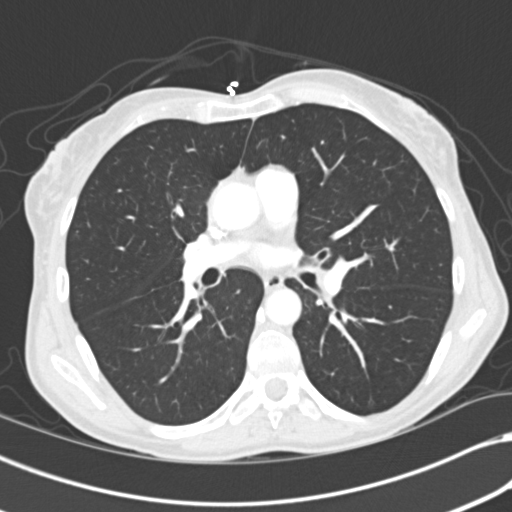
[im 93/162  mediastinal]
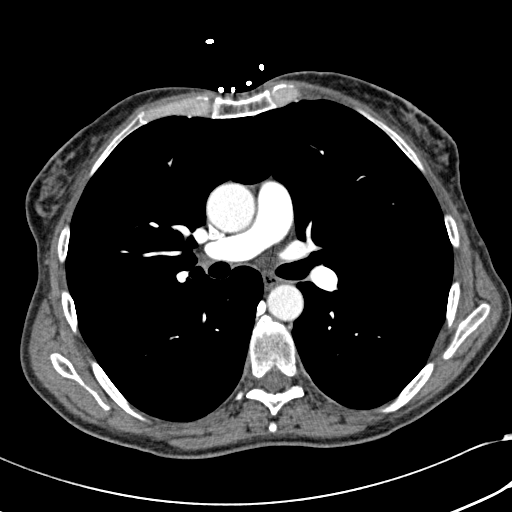
[im 106/162  lung]
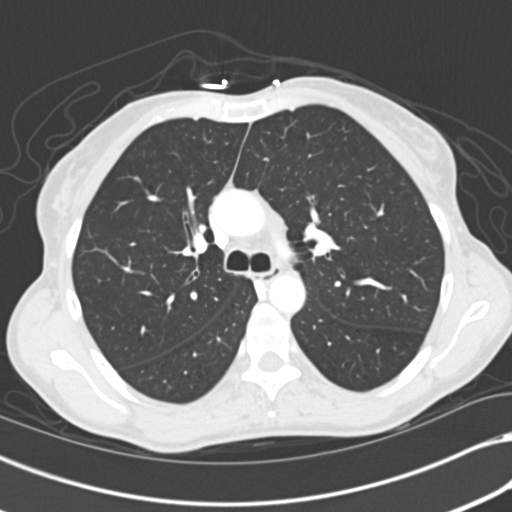
[im 112/162  mediastinal]
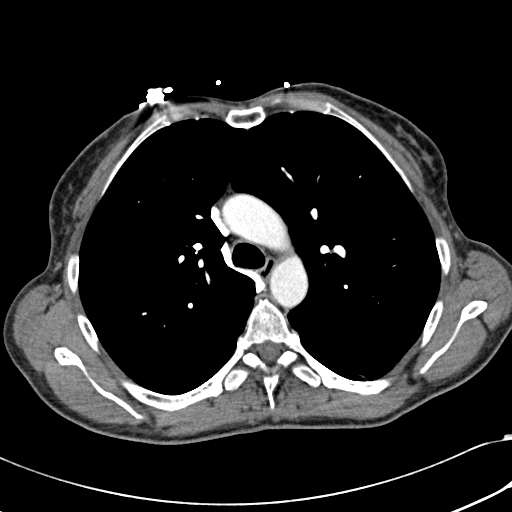
[im 124/162  lung]
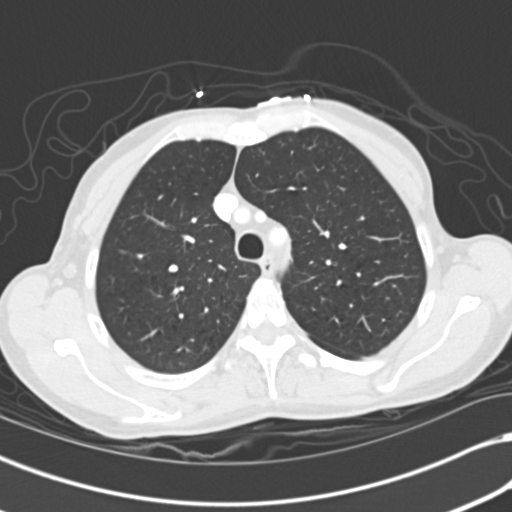
[im 137/162  mediastinal]
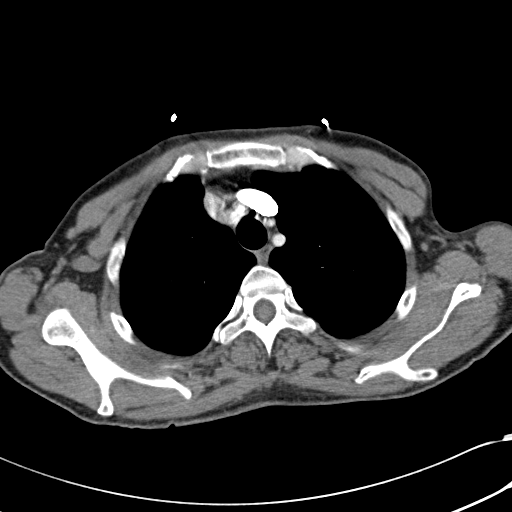
[im 143/162  lung]
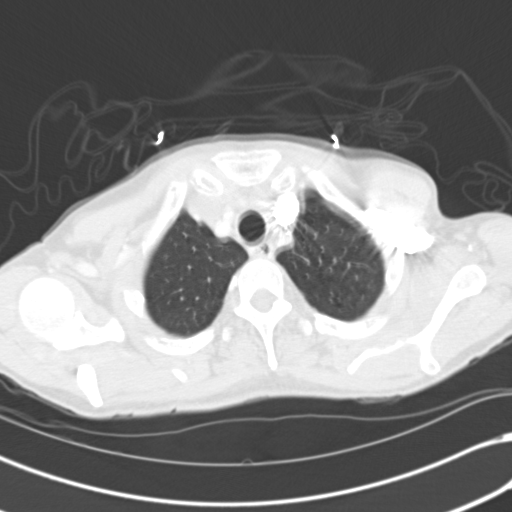
[im 155/162  mediastinal]
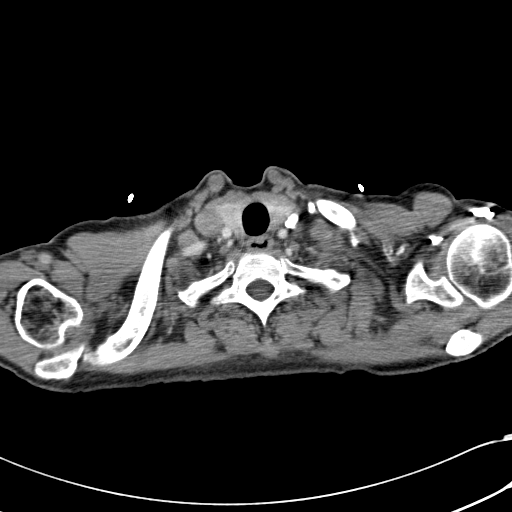

[Series 602: coronals · coronal · 0.63mm/px · 3 of 113 slices shown]
[im 23/113  mediastinal]
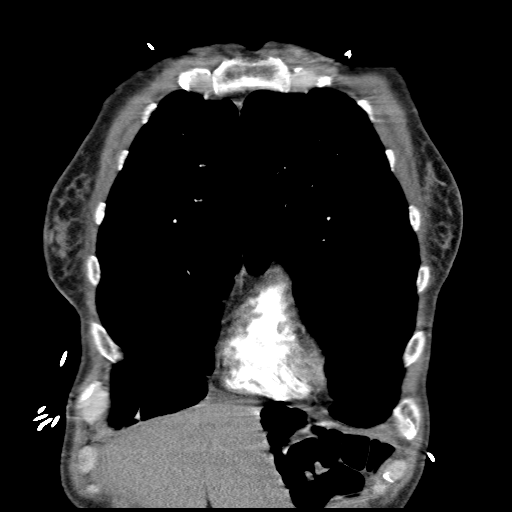
[im 45/113  mediastinal]
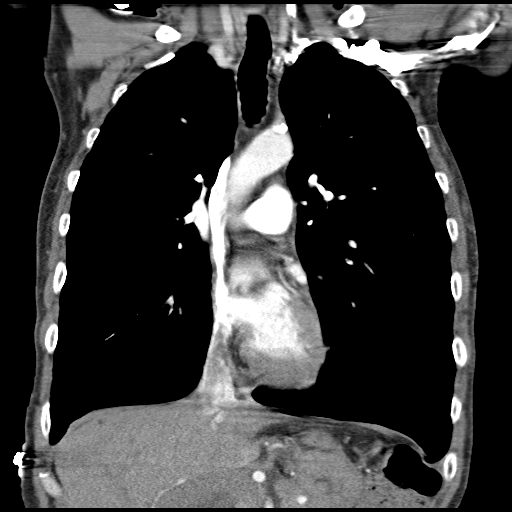
[im 68/113  mediastinal]
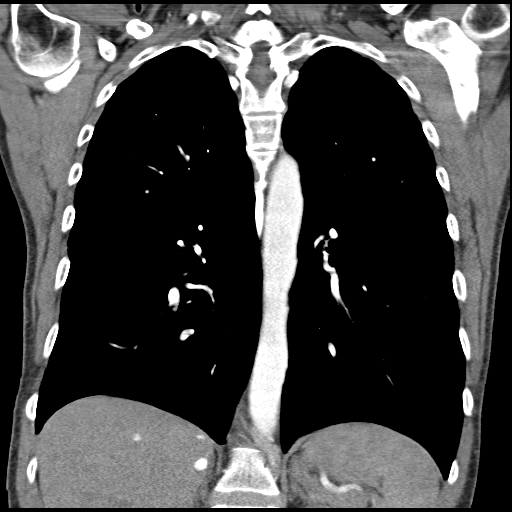

[19 of 36 positions shown; findings below may reference images not displayed]

FINDINGS: Study is technically satisfactory.  
There are no filling defects identified in the pulmonary arterial system to suggest pulmonary emboli.  There is no evidence of thoracic aortic dissection or aneurysm.  The heart and great vessels are stable and unremarkable.  There is no evidence of pleural or pericardial effusions or enlarged lymph nodes.  Mild central peribronchial thickening is again identified.  A few tiny nodular opacities within the lungs, primarily left lower lobeare unchanged since 05/12/04.  No new pulmonary opacities are noted.  Visualized thyroid gland is unremarkable.
IMPRESSION: 1.  No acute findings. 
2.  No evidence of pulmonary emboli or thoracic aortic dissection/aneurysm. 
3.  Stable mild peribronchial thickening.

## 2009-07-07 IMAGING — CR DG CHEST 1V PORT
1 series · 1 of 1 positions shown · non-contrast
Comparison: Portable chest x-ray 05/26/2007 and CT chest 04/20/2007.

CLINICAL DATA: Chest pain. Shortness of breath. Seizures.

PORTABLE CHEST - 1 VIEW  [DATE]/[id number] hours:

[view not recorded]
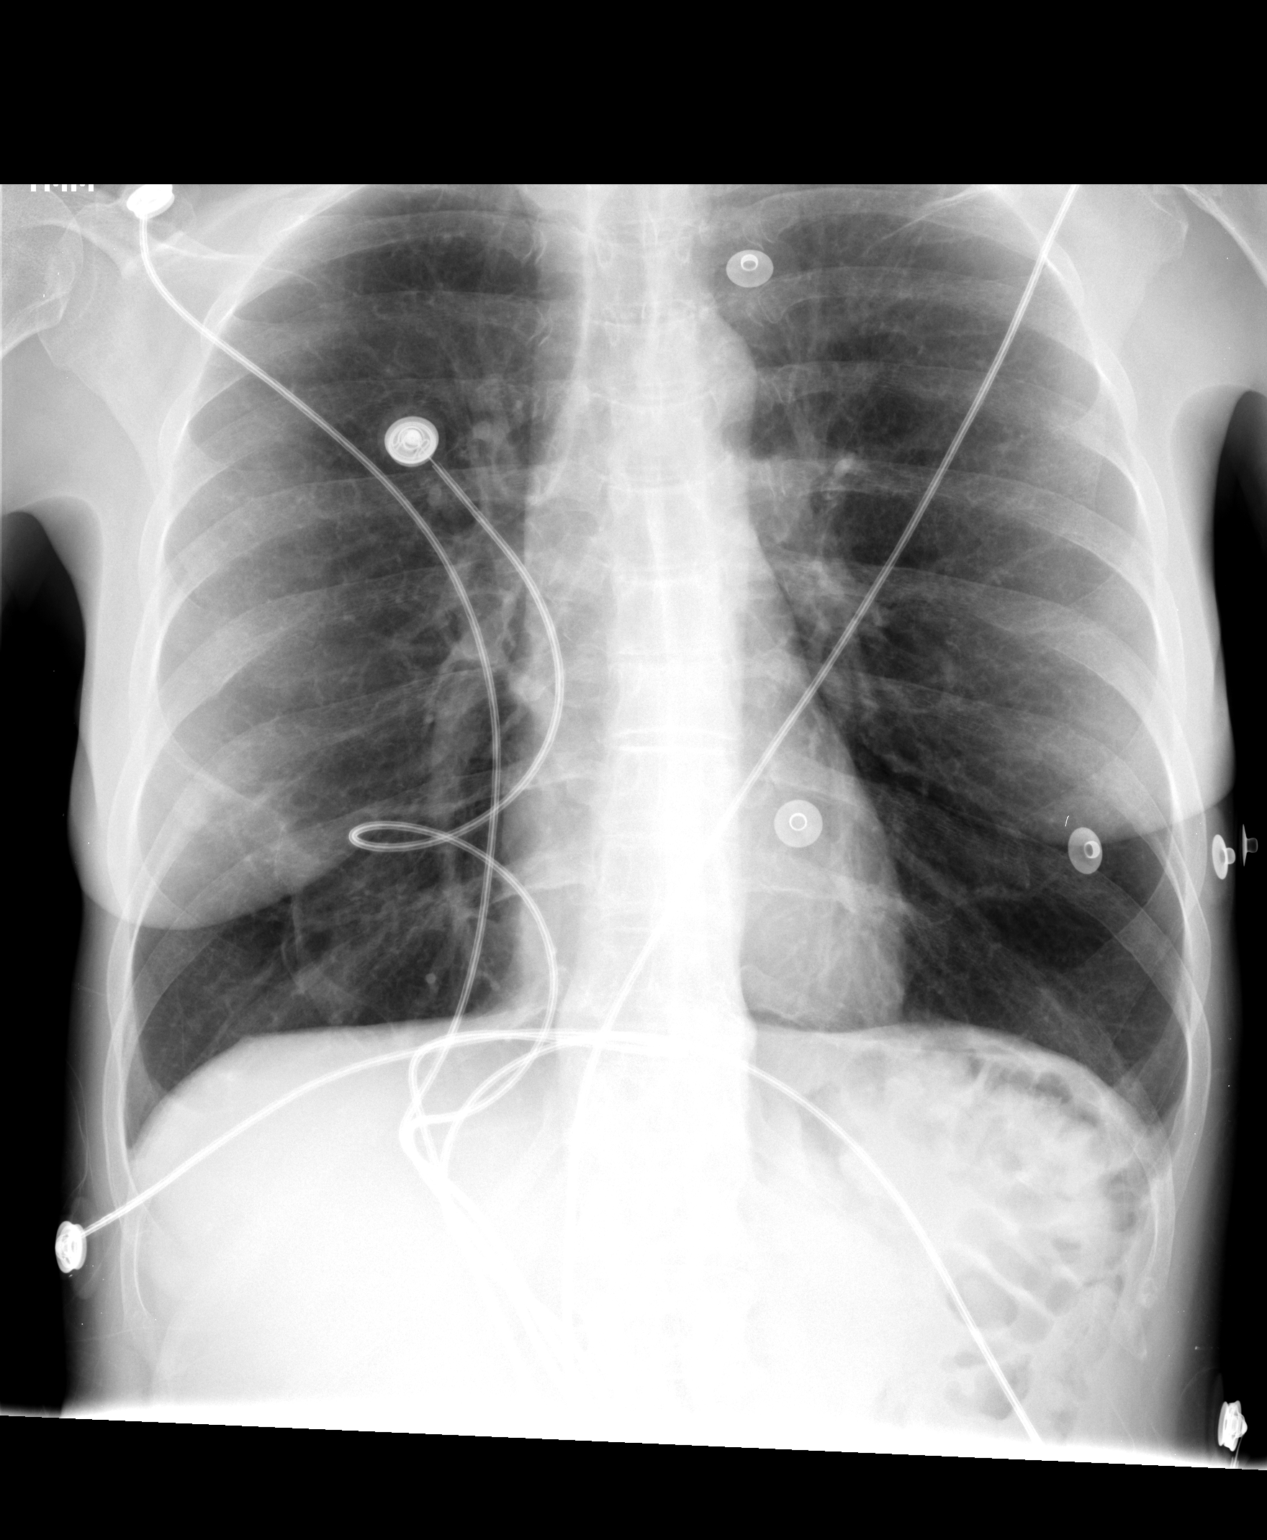

[1 of 1 positions shown; findings below may reference images not displayed]

FINDINGS: Pulmonary hyperinflation with mild chronic interstitial disease,
unchanged. New minimal linear atelectasis at the right base. Lungs otherwise
clear. No pleural effusions. Cardiomediastinal silhouette unremarkable for age.
Nipple shadow projected over the right lung base.
IMPRESSION: Minimal linear atelectasis at the right base. No acute cardiopulmonary disease
otherwise. Baseline COPD/emphysema.

## 2009-07-07 IMAGING — CT CT HEAD W/O CM
1 of 2 series · 16 of 30 positions shown, 20 images · IV contrast (agent unspecified)
Comparison: 04/20/07.

CLINICAL DATA: Seizures. Reportedly hit her head.
 HEAD CT WITHOUT CONTRAST:
TECHNIQUE: Contiguous axial images were obtained from the base of the skull through the vertex according to standard protocol without contrast.

[Series 3: recon 2: brain · axial · 0.47mm/px · z∈[+118,+255]mm · 16 of 112 slices shown, 20 images]
[im 6/112  brain]
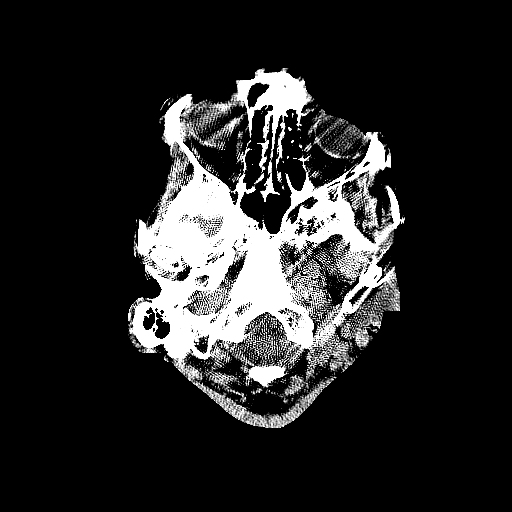
[im 6/112  bone]
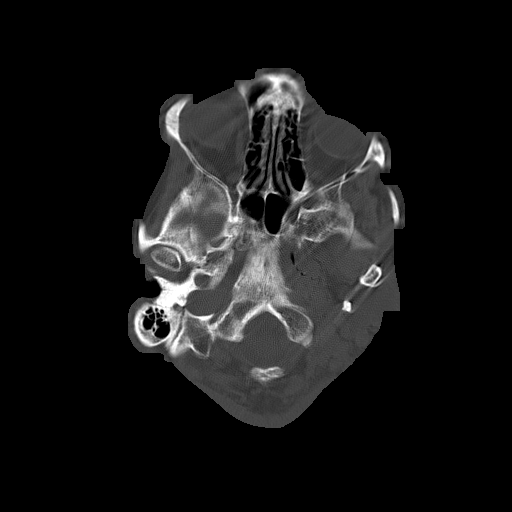
[im 12/112  brain]
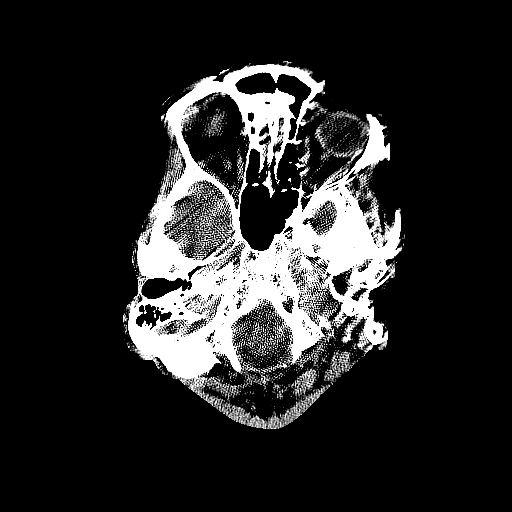
[im 18/112  brain]
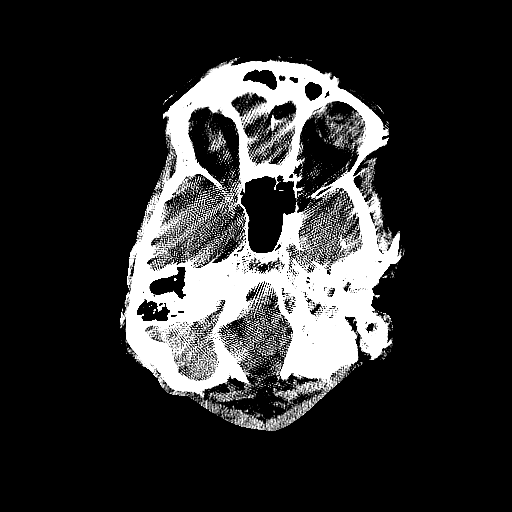
[im 24/112  brain]
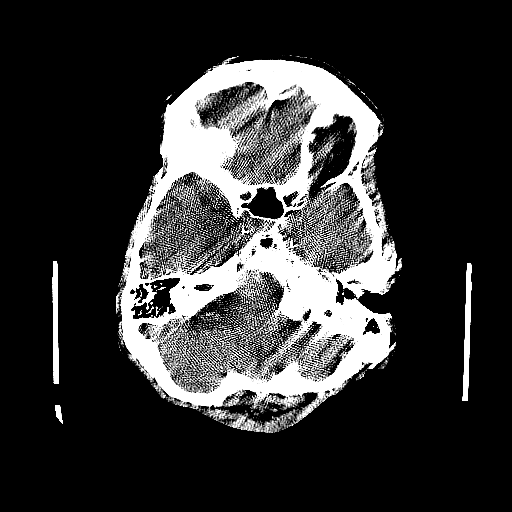
[im 36/112  brain]
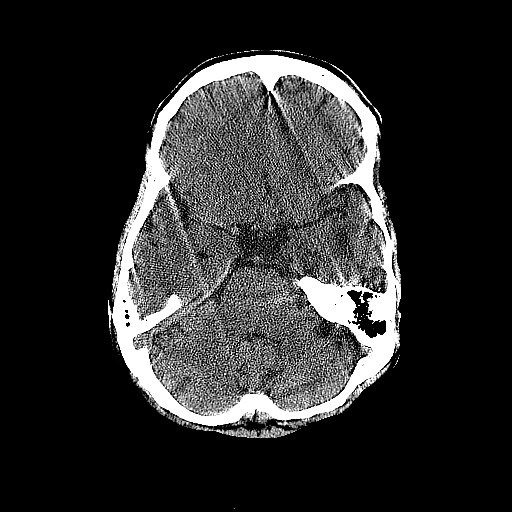
[im 36/112  bone]
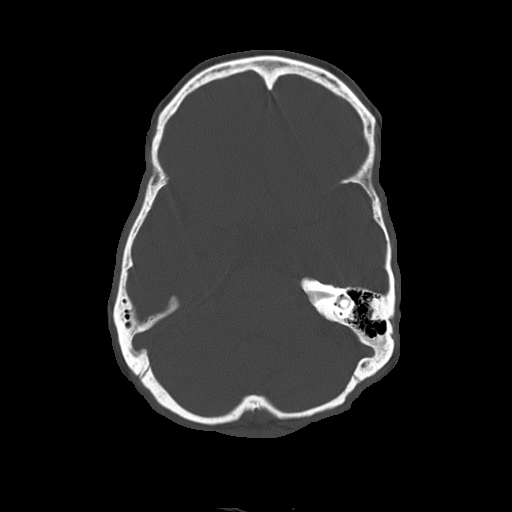
[im 41/112  brain]
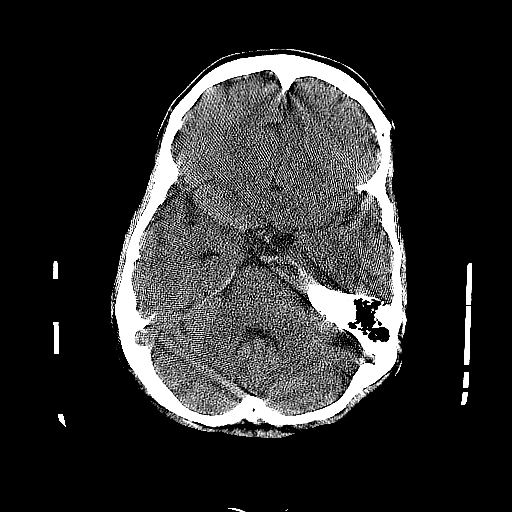
[im 47/112  brain]
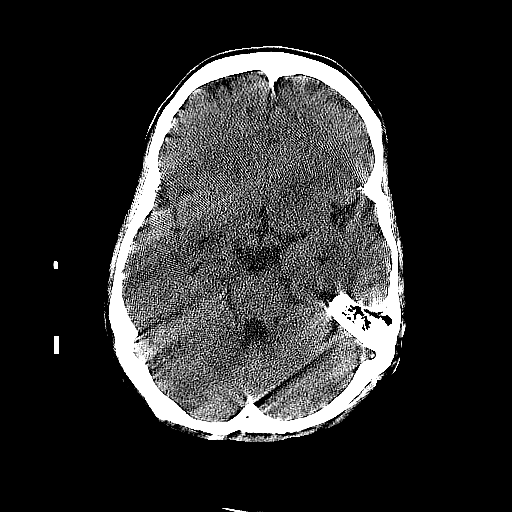
[im 53/112  brain]
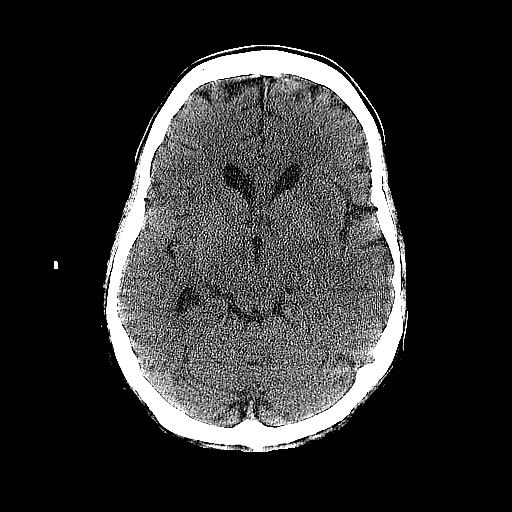
[im 59/112  brain]
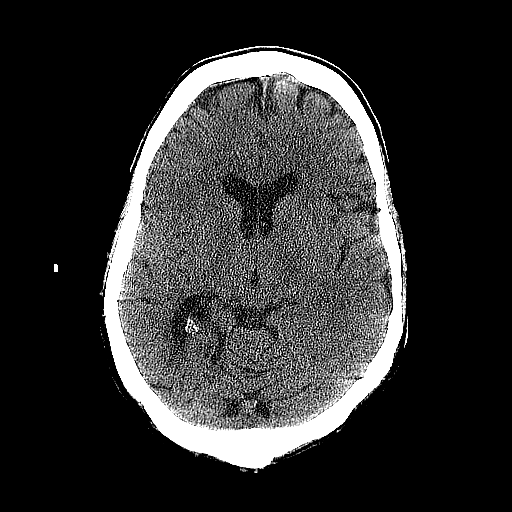
[im 59/112  bone]
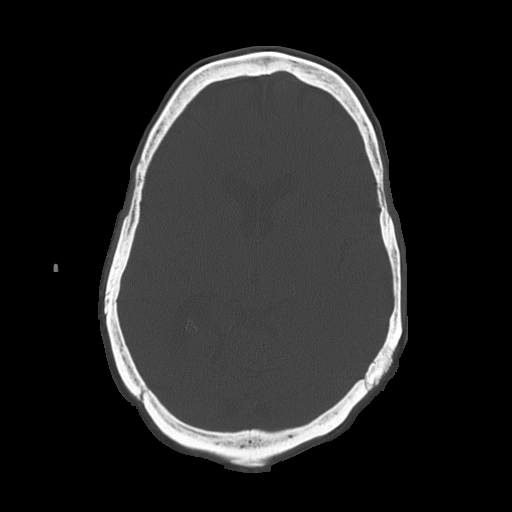
[im 65/112  brain]
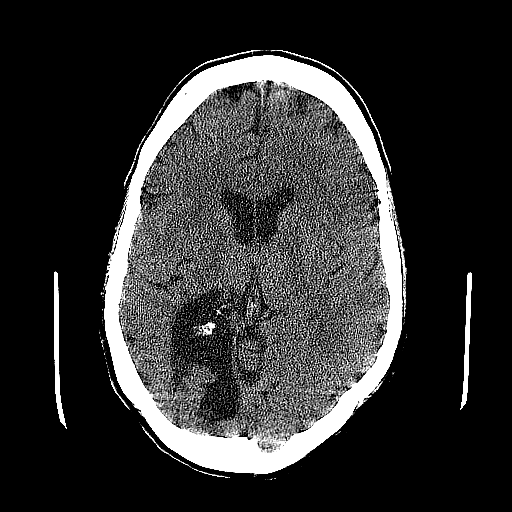
[im 71/112  brain]
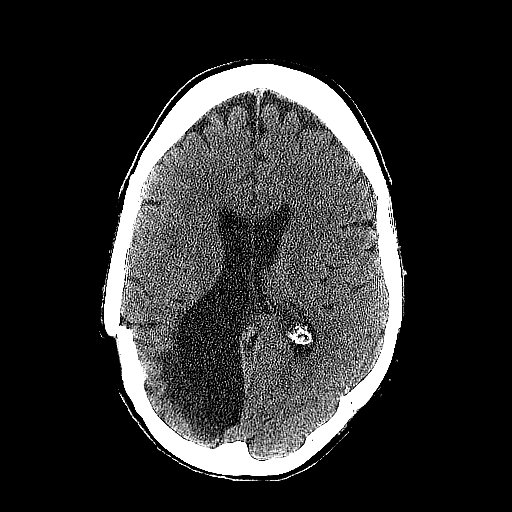
[im 76/112  brain]
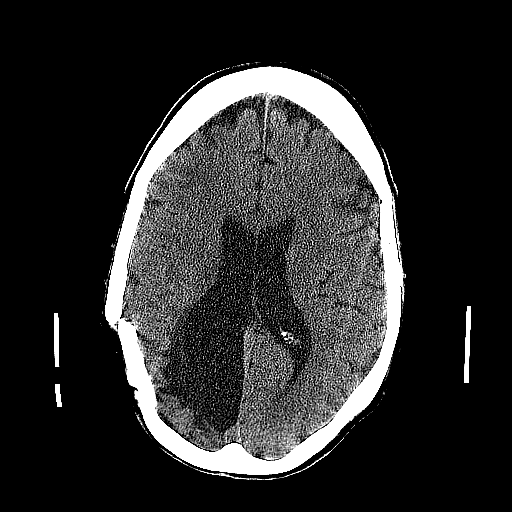
[im 88/112  brain]
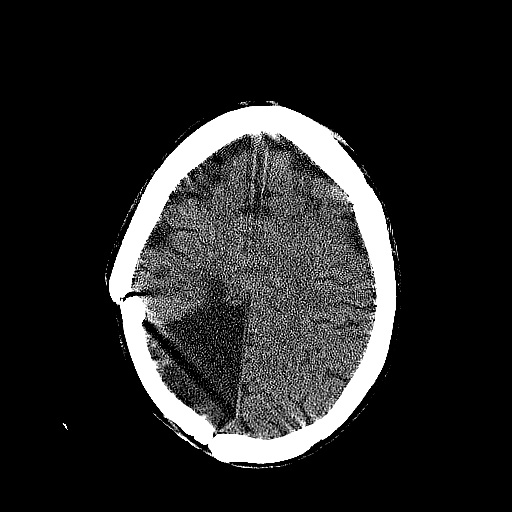
[im 88/112  bone]
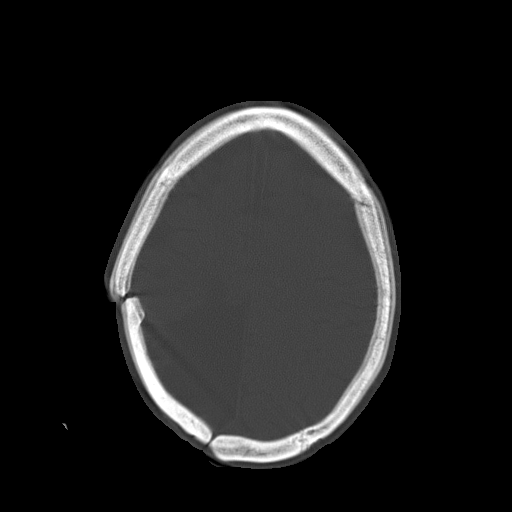
[im 94/112  brain]
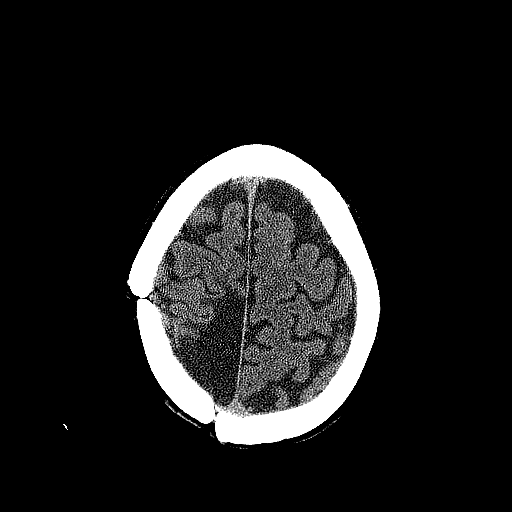
[im 100/112  brain]
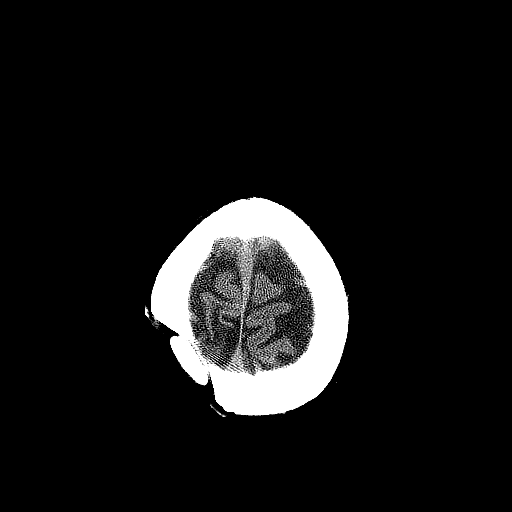
[im 106/112  brain]
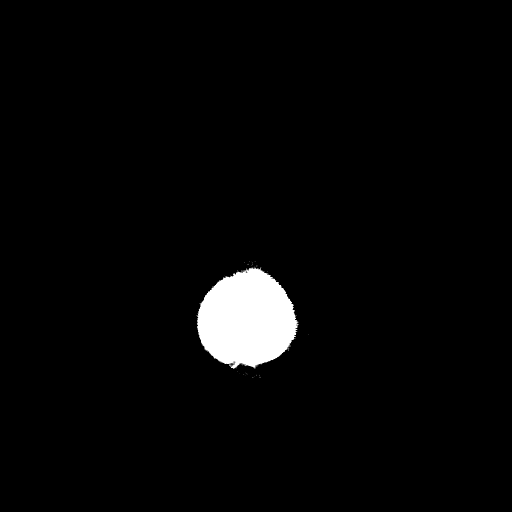

[16 of 30 positions shown; findings below may reference images not displayed]

FINDINGS: Right posteroparietal craniotomy changes with bone flap in place.  Encephalomalacia and porencephaly of the right occipital lobe and posteromedial aspect of the right parietal lobe.  No acute intracranial abnormality. Negative for hemorrhage, mass or findings to suggest an acute infarction.
IMPRESSION: No acute intracranial abnormality.

## 2009-07-15 ENCOUNTER — Emergency Department (HOSPITAL_COMMUNITY): Admission: EM | Admit: 2009-07-15 | Discharge: 2009-07-16 | Payer: Self-pay | Admitting: Emergency Medicine

## 2009-10-02 ENCOUNTER — Emergency Department (HOSPITAL_COMMUNITY): Admission: EM | Admit: 2009-10-02 | Discharge: 2009-10-02 | Payer: Self-pay | Admitting: Emergency Medicine

## 2010-05-20 ENCOUNTER — Emergency Department (HOSPITAL_COMMUNITY): Admission: EM | Admit: 2010-05-20 | Discharge: 2010-05-20 | Payer: Self-pay | Admitting: Emergency Medicine

## 2010-05-28 ENCOUNTER — Emergency Department (HOSPITAL_COMMUNITY): Admission: EM | Admit: 2010-05-28 | Discharge: 2010-05-28 | Payer: Self-pay | Admitting: Emergency Medicine

## 2010-07-21 IMAGING — CT CT HEAD W/O CM
1 of 2 series · 13 of 30 positions shown, 17 images · non-contrast
Comparison: 05/28/2007

CLINICAL DATA: Headache

CT HEAD WITHOUT CONTRAST
TECHNIQUE: Contiguous axial images were obtained from the base of
the skull through the vertex without contrast.

[Series 2: brain · axial · 0.47mm/px · z∈[+126,+258]mm · 13 of 32 slices shown, 17 images]
[im 3/32  brain]
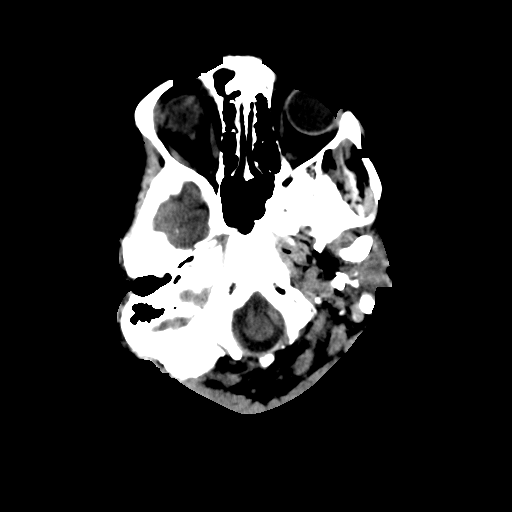
[im 3/32  bone]
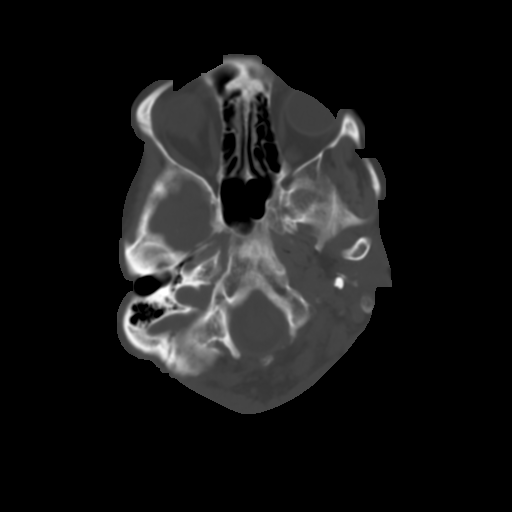
[im 5/32  brain]
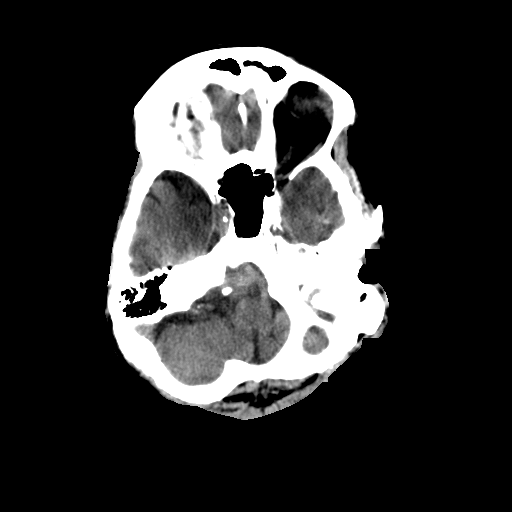
[im 7/32  brain]
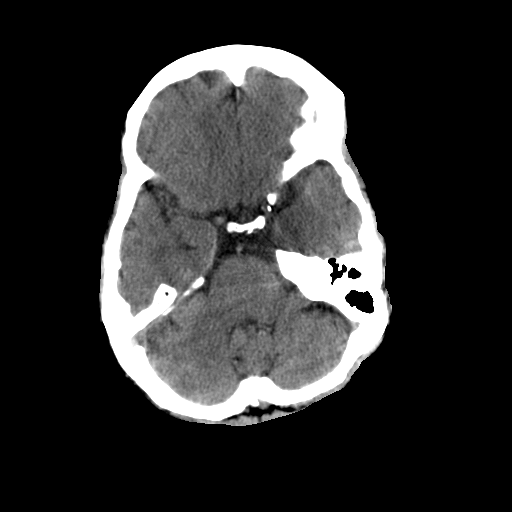
[im 9/32  brain]
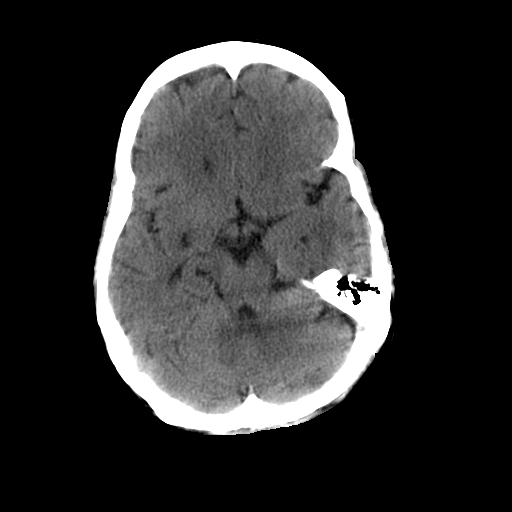
[im 12/32  brain]
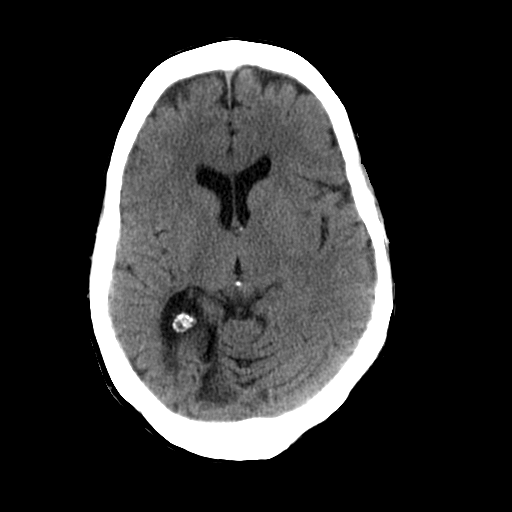
[im 12/32  bone]
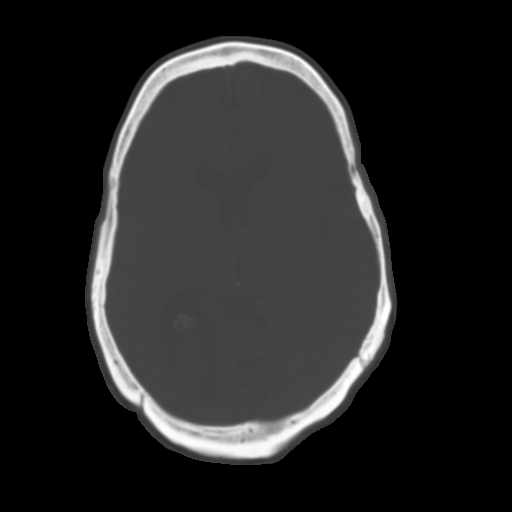
[im 14/32  brain]
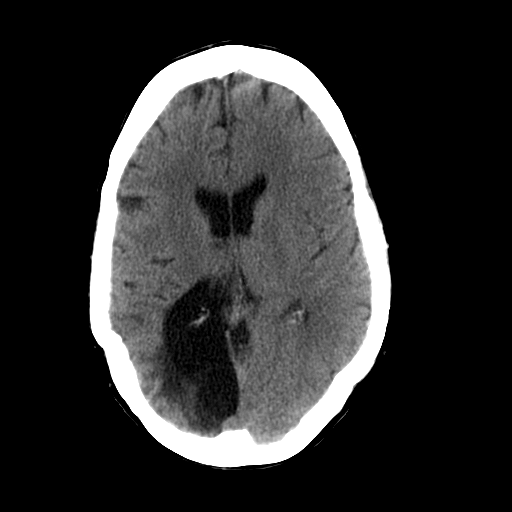
[im 16/32  brain]
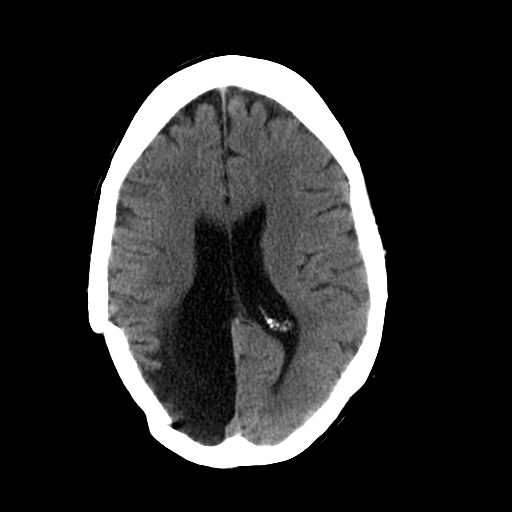
[im 18/32  brain]
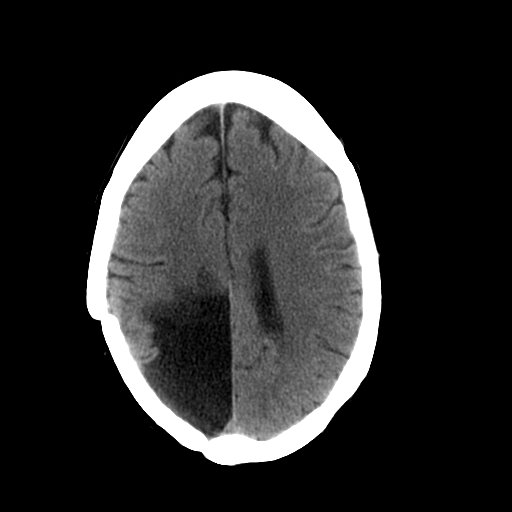
[im 20/32  brain]
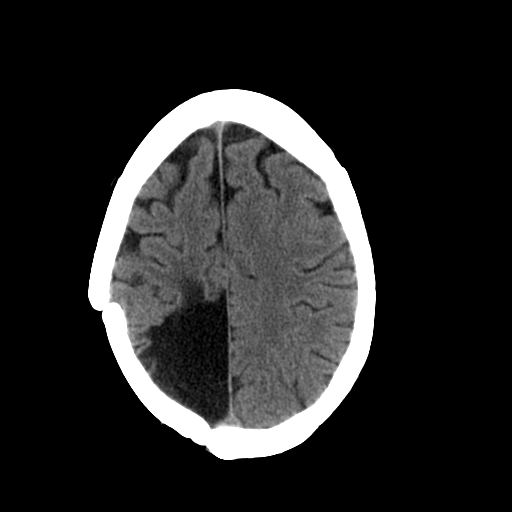
[im 20/32  bone]
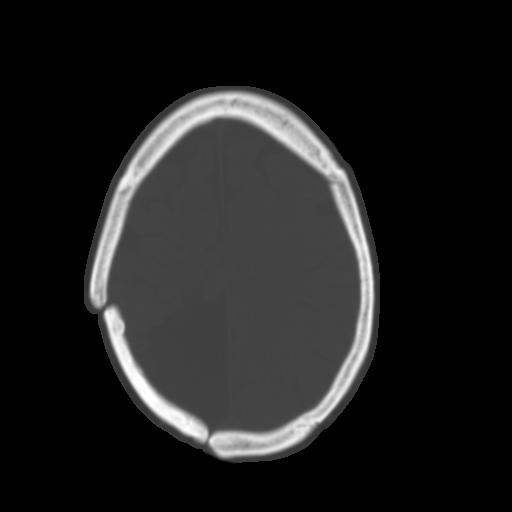
[im 23/32  brain]
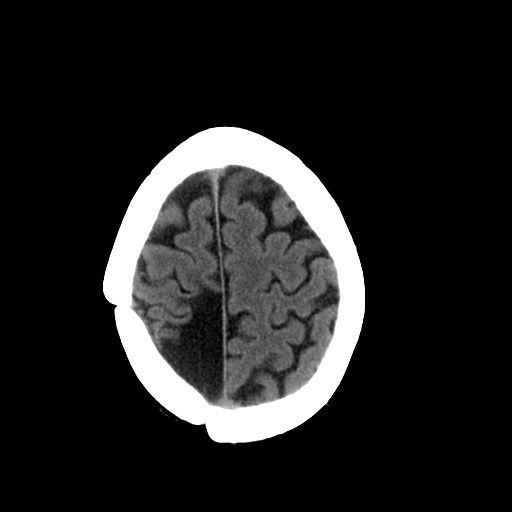
[im 25/32  brain]
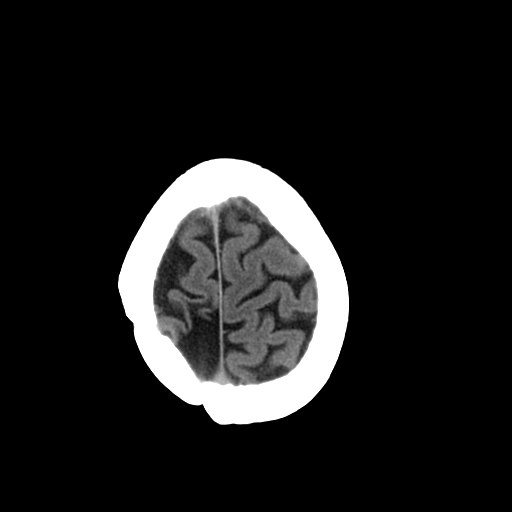
[im 27/32  brain]
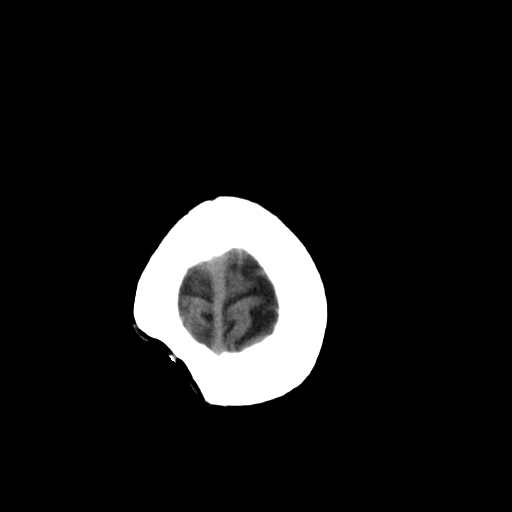
[im 29/32  brain]
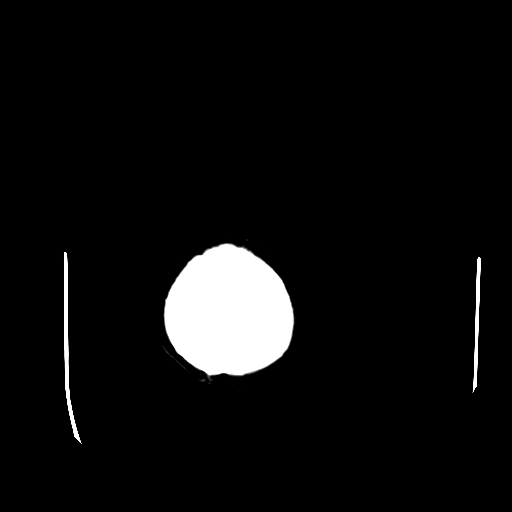
[im 29/32  bone]
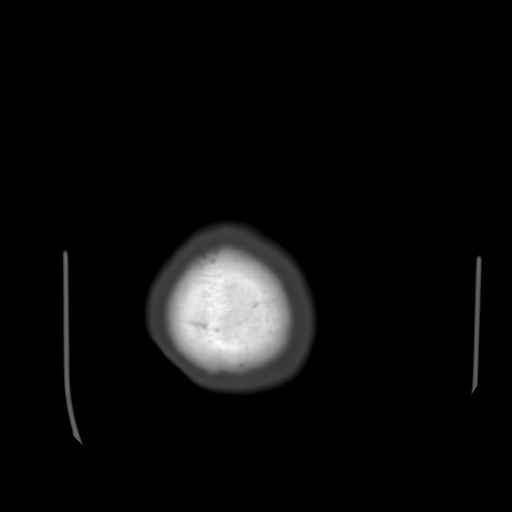

[13 of 30 positions shown; findings below may reference images not displayed]

FINDINGS: Right parietal craniotomy is stable.  Encephalomalacia in
the right parietal and occipital lobes is stable.  There is no mass
effect, midline shift, or acute intracranial hemorrhage.  Chronic
ischemic changes in the left basal ganglia are stable.  Mastoid air
cells are clear.  Visualized paranasal sinuses are clear.  Marked
soft tissue prominence of the right nasopharynx is noted underlying
nasal pharyngeal mass is not excluded.
IMPRESSION: No acute intracranial pathology.

Prominent tissue in the right side of the nasopharynx.  Underlying
skull mass such as squamous cell carcinoma is not excluded.
Correlate with physical exam.

## 2010-08-15 ENCOUNTER — Inpatient Hospital Stay (HOSPITAL_COMMUNITY)
Admission: EM | Admit: 2010-08-15 | Discharge: 2010-08-17 | Payer: Self-pay | Source: Home / Self Care | Attending: Internal Medicine | Admitting: Internal Medicine

## 2010-08-19 LAB — DIFFERENTIAL
Basophils Absolute: 0 10*3/uL (ref 0.0–0.1)
Basophils Relative: 0 % (ref 0–1)
Eosinophils Absolute: 0 10*3/uL (ref 0.0–0.7)
Eosinophils Relative: 1 % (ref 0–5)
Lymphocytes Relative: 30 % (ref 12–46)
Lymphs Abs: 1.6 10*3/uL (ref 0.7–4.0)
Monocytes Absolute: 0.3 10*3/uL (ref 0.1–1.0)
Monocytes Relative: 6 % (ref 3–12)
Neutro Abs: 3.3 10*3/uL (ref 1.7–7.7)
Neutrophils Relative %: 63 % (ref 43–77)

## 2010-08-19 LAB — POCT I-STAT, CHEM 8
BUN: 8 mg/dL (ref 6–23)
Calcium, Ion: 1.15 mmol/L (ref 1.12–1.32)
Chloride: 101 mEq/L (ref 96–112)
Creatinine, Ser: 0.7 mg/dL (ref 0.4–1.2)
Glucose, Bld: 95 mg/dL (ref 70–99)
HCT: 43 % (ref 36.0–46.0)
Hemoglobin: 14.6 g/dL (ref 12.0–15.0)
Potassium: 4 mEq/L (ref 3.5–5.1)
Sodium: 138 mEq/L (ref 135–145)
TCO2: 32 mmol/L (ref 0–100)

## 2010-08-19 LAB — CBC
HCT: 41.6 % (ref 36.0–46.0)
HCT: 41 % (ref 36.0–46.0)
Hemoglobin: 13.8 g/dL (ref 12.0–15.0)
Hemoglobin: 13.5 g/dL (ref 12.0–15.0)
MCH: 34.8 pg — ABNORMAL HIGH (ref 26.0–34.0)
MCH: 34.6 pg — ABNORMAL HIGH (ref 26.0–34.0)
MCHC: 33.2 g/dL (ref 30.0–36.0)
MCHC: 32.9 g/dL (ref 30.0–36.0)
MCV: 105.1 fL — ABNORMAL HIGH (ref 78.0–100.0)
MCV: 105.1 fL — ABNORMAL HIGH (ref 78.0–100.0)
Platelets: 154 10*3/uL (ref 150–400)
Platelets: 159 10*3/uL (ref 150–400)
RBC: 3.96 MIL/uL (ref 3.87–5.11)
RBC: 3.9 MIL/uL (ref 3.87–5.11)
RDW: 12.8 % (ref 11.5–15.5)
RDW: 12.8 % (ref 11.5–15.5)
WBC: 5.2 10*3/uL (ref 4.0–10.5)
WBC: 6.3 10*3/uL (ref 4.0–10.5)

## 2010-08-19 LAB — COMPREHENSIVE METABOLIC PANEL
ALT: 11 U/L (ref 0–35)
AST: 16 U/L (ref 0–37)
Albumin: 3.6 g/dL (ref 3.5–5.2)
Alkaline Phosphatase: 64 U/L (ref 39–117)
BUN: 5 mg/dL — ABNORMAL LOW (ref 6–23)
CO2: 27 mEq/L (ref 19–32)
Calcium: 8.7 mg/dL (ref 8.4–10.5)
Chloride: 109 mEq/L (ref 96–112)
Creatinine, Ser: 0.58 mg/dL (ref 0.4–1.2)
GFR calc Af Amer: >60 mL/min (ref >60)
GFR calc non Af Amer: >60 mL/min (ref >60)
Glucose, Bld: 91 mg/dL (ref 70–99)
Potassium: 4.1 mEq/L (ref 3.5–5.1)
Sodium: 140 mEq/L (ref 135–145)
Total Bilirubin: 0.4 mg/dL (ref 0.3–1.2)
Total Protein: 6 g/dL (ref 6.0–8.3)

## 2010-08-19 LAB — URINALYSIS, MICROSCOPIC ONLY
Bilirubin Urine: NEGATIVE
Hgb urine dipstick: NEGATIVE
Ketones, ur: NEGATIVE mg/dL
Leukocytes, UA: NEGATIVE
Nitrite: NEGATIVE
Protein, ur: NEGATIVE mg/dL
Specific Gravity, Urine: 1.016 (ref 1.005–1.030)
Urine Glucose, Fasting: NEGATIVE mg/dL
Urobilinogen, UA: 0.2 mg/dL (ref 0.0–1.0)
pH: 6.5 (ref 5.0–8.0)

## 2010-08-19 LAB — HEPATIC FUNCTION PANEL
ALT: 13 U/L (ref 0–35)
AST: 18 U/L (ref 0–37)
Albumin: 4.1 g/dL (ref 3.5–5.2)
Alkaline Phosphatase: 77 U/L (ref 39–117)
Bilirubin, Direct: 0.1 mg/dL (ref 0.0–0.3)
Indirect Bilirubin: 0.2 mg/dL — ABNORMAL LOW (ref 0.3–0.9)
Total Bilirubin: 0.3 mg/dL (ref 0.3–1.2)
Total Protein: 6.8 g/dL (ref 6.0–8.3)

## 2010-08-19 LAB — PHENYTOIN LEVEL, TOTAL
Phenytoin Lvl: 36.4 ug/mL (ref 10.0–20.0)
Phenytoin Lvl: 23.8 ug/mL — ABNORMAL HIGH (ref 10.0–20.0)
Phenytoin Lvl: 19.6 ug/mL (ref 10.0–20.0)

## 2010-08-19 LAB — PROTIME-INR
INR: 1.08 (ref 0.00–1.49)
Prothrombin Time: 14.2 seconds (ref 11.6–15.2)

## 2010-08-19 LAB — PHOSPHORUS: Phosphorus: 4 mg/dL (ref 2.3–4.6)

## 2010-08-19 LAB — APTT: aPTT: 34 seconds (ref 24–37)

## 2010-08-19 LAB — BRAIN NATRIURETIC PEPTIDE: Pro B Natriuretic peptide (BNP): 49.6 pg/mL (ref 0.0–100.0)

## 2010-08-19 LAB — MAGNESIUM: Magnesium: 2.1 mg/dL (ref 1.5–2.5)

## 2010-08-19 LAB — TSH: TSH: 1.179 u[IU]/mL (ref 0.350–4.500)

## 2010-08-24 ENCOUNTER — Emergency Department (HOSPITAL_COMMUNITY)
Admission: EM | Admit: 2010-08-24 | Discharge: 2010-08-25 | Payer: Self-pay | Source: Home / Self Care | Admitting: Emergency Medicine

## 2010-08-25 ENCOUNTER — Inpatient Hospital Stay (HOSPITAL_COMMUNITY)
Admission: EM | Admit: 2010-08-25 | Discharge: 2010-08-29 | Payer: Self-pay | Source: Home / Self Care | Attending: Internal Medicine | Admitting: Internal Medicine
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
## 2010-08-27 LAB — CBC
HCT: 35.1 % — ABNORMAL LOW (ref 36.0–46.0)
Hemoglobin: 12.1 g/dL (ref 12.0–15.0)
Hemoglobin: 13.8 g/dL (ref 12.0–15.0)
MCHC: 34.5 g/dL (ref 30.0–36.0)
MCHC: 35.1 g/dL (ref 30.0–36.0)
MCHC: 33.9 g/dL (ref 30.0–36.0)
Platelets: 134 10*3/uL — ABNORMAL LOW (ref 150–400)
RBC: 3.43 MIL/uL — ABNORMAL LOW (ref 3.87–5.11)
RDW: 12.3 % (ref 11.5–15.5)
RDW: 12.2 % (ref 11.5–15.5)
WBC: 5.4 10*3/uL (ref 4.0–10.5)
WBC: 4.7 10*3/uL (ref 4.0–10.5)

## 2010-08-27 LAB — BASIC METABOLIC PANEL
BUN: 8 mg/dL (ref 6–23)
Calcium: 9 mg/dL (ref 8.4–10.5)
Calcium: 9.3 mg/dL (ref 8.4–10.5)
Chloride: 105 mEq/L (ref 96–112)
Creatinine, Ser: 0.59 mg/dL (ref 0.4–1.2)
GFR calc Af Amer: >60 mL/min (ref >60)
GFR calc non Af Amer: >60 mL/min (ref >60)
Glucose, Bld: 93 mg/dL (ref 70–99)
Potassium: 3.7 mEq/L (ref 3.5–5.1)
Sodium: 144 mEq/L (ref 135–145)

## 2010-08-27 LAB — DIFFERENTIAL
Basophils Relative: 0 % (ref 0–1)
Eosinophils Absolute: 0 10*3/uL (ref 0.0–0.7)
Lymphocytes Relative: 40 % (ref 12–46)
Lymphs Abs: 1.8 10*3/uL (ref 0.7–4.0)
Neutrophils Relative %: 55 % (ref 43–77)

## 2010-08-27 LAB — POCT I-STAT, CHEM 8
BUN: 17 mg/dL (ref 6–23)
Calcium, Ion: 1.07 mmol/L — ABNORMAL LOW (ref 1.12–1.32)
Creatinine, Ser: 0.8 mg/dL (ref 0.4–1.2)
Glucose, Bld: 101 mg/dL — ABNORMAL HIGH (ref 70–99)

## 2010-08-27 LAB — URINALYSIS, ROUTINE W REFLEX MICROSCOPIC
Bilirubin Urine: NEGATIVE
Nitrite: NEGATIVE
Specific Gravity, Urine: 1.012 (ref 1.005–1.030)
pH: 7.5 (ref 5.0–8.0)

## 2010-08-27 LAB — PHENYTOIN LEVEL, TOTAL: Phenytoin Lvl: 4.6 ug/mL — ABNORMAL LOW (ref 10.0–20.0)

## 2010-08-27 LAB — MAGNESIUM: Magnesium: 2.3 mg/dL (ref 1.5–2.5)

## 2010-08-28 LAB — DIFFERENTIAL
Basophils Absolute: 0 10*3/uL (ref 0.0–0.1)
Basophils Relative: 0 % (ref 0–1)
Neutro Abs: 1.6 10*3/uL — ABNORMAL LOW (ref 1.7–7.7)
Neutrophils Relative %: 45 % (ref 43–77)

## 2010-08-28 LAB — COMPREHENSIVE METABOLIC PANEL
ALT: 13 U/L (ref 0–35)
AST: 14 U/L (ref 0–37)
CO2: 30 mEq/L (ref 19–32)
Calcium: 9.2 mg/dL (ref 8.4–10.5)
Chloride: 107 mEq/L (ref 96–112)
GFR calc Af Amer: >60 mL/min (ref >60)
GFR calc non Af Amer: >60 mL/min (ref >60)
Potassium: 4 mEq/L (ref 3.5–5.1)
Sodium: 143 mEq/L (ref 135–145)

## 2010-08-28 LAB — CBC
Hemoglobin: 12.4 g/dL (ref 12.0–15.0)
MCHC: 33.6 g/dL (ref 30.0–36.0)
RBC: 3.62 MIL/uL — ABNORMAL LOW (ref 3.87–5.11)
WBC: 3.7 10*3/uL — ABNORMAL LOW (ref 4.0–10.5)

## 2010-08-28 NOTE — Consult Note (Addendum)
[name], ZECHMAN                ACCOUNT NO.:  [id number]  MEDICAL RECORD NO.:  [id number]          PATIENT TYPE:  INP  LOCATION:  1538                         FACILITY:  [facility]  PHYSICIAN:  Joycelyn Schmid, MD   DATE OF BIRTH:  17-Dec-1955  DATE OF CONSULTATION:  08/28/2010 DATE OF DISCHARGE:                                CONSULTATION   REFERRING PHYSICIAN:  [facility].  REASON FOR CONSULTATION:  Breakthrough seizures.  HISTORY OF PRESENT ILLNESS:  This is a 55 year old female with history of intracerebral hemorrhage, arteriovenous malformation status post craniectomy in 1988 with subsequent seizure disorder.  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  She has tried multiple medications over the years including Dilantin, phenobarbital, Depakote, Keppra and Lamictal.  The patient states that for approximately 20 years she was very well- controlled on Dilantin.  Over the last few years her seizures have increased in frequency somewhat.  She has been ranging from one to five seizures per year.  She has been followed in the clinic by Dr. Sharene Skeans. She was recently admitted to the hospital from January 12 to January 14 and found to be toxic on Dilantin with a level of 36.  She also had breakthrough seizures.  She was discharged on Dilantin 200 mg a day due to toxicity.  Keppra was increased to 500 mg twice a day.  She returned to the hospital on January 22 for additional breakthrough seizures, this time found to have a subtherapeutic Dilantin level of 4.6.  The patient describes her typical seizures as loss of consciousness without warning, convulsions per witnesses, postictal confusion, urinary incontinence.  Now the patient is concerned that Keppra is causing her breakthrough seizures.  Presently she is on Dilantin 300 in the morning and 100 at night.  She is off of Keppra.  PAST MEDICAL HISTORY:  Intracerebral  hemorrhage, arteriovenous malformation, status post craniectomy in 1988, seizure disorder, asthma.  SOCIAL HISTORY:  She is a 60 pack-year smoker, quit this month.  Denies alcohol.  Lives with her mother.  Denies illicit drug use.  PAST SURGICAL HISTORY:  C-section x3.  FAMILY HISTORY:  Brother has sinus cancer.  Mother with uterine cancer and colon cancer.  REVIEW OF SYSTEMS:  As per the HPI.  Presently denies headache, chest pain, shortness of breath, numbness, weakness.  She does have some unsteadiness with her walking.  PHYSICAL EXAMINATION:  VITAL SIGNS:  Blood pressure 110/67, pulse 71, respirations 16, temperature 98.3. CRANIAL NERVE EXAMINATION:  Awake and alert.  Language fluent. Comprehension intact.  She has postural head tremor and postural tremor of the upper extremities.  She has a left homonymous hemianopsia. Pupils equal and reactive to light from 3 to 2 mm.  Visual fields again notable for left homonymous hemianopsia.  Facial sensation and strength are symmetric.  Uvula is midline.  Shoulders symmetric.  Tongue is midline.  Mild end-gaze nystagmus on lateral gaze. MOTOR EXAMINATION:  Normal bulk and tone, 5/5 strength in bilateral upper and lower extremities.  She has hyperreflexia throughout 3+. Downgoing toes. SENSORY EXAMINATION:  Slightly decreased to vibration in the  bilateral toes.  Temperature and sensation normal. CEREBELLAR TESTING:  Finger-nose-finger notable for mild postural and intention tremor. GAIT TESTING:  The patient is able to stand and walk.  She has some difficulty with tandem gait.  Able to walk on her toes and heels. Romberg negative. CARDIOVASCULAR:  Regular rate and rhythm.  No murmurs.  No carotid bruits.  LABORATORY DATA:  Dilantin level of 15.2.  Sodium 142, BUN 8, creatinine 0.66, WBC 3.7, platelets 129.  RADIOLOGIC: 1. EEG from April 20, 2007, shows right posterior quadrant     epileptiform discharges. 2. MRI scan from  August 16, 2009, shows cystic encephalomalacia with     gliosis in the right occipital, posterior temporal, parietal     regions.  ASSESSMENT/PLAN/RECOMMENDATIONS:  This is a 55 year old female with history of intracerebral hemorrhage, AVM, status post craniotomy in 1988, with long history of subsequent seizures, now with breakthrough seizures.  Unclear what the etiology of her increased frequency is.  I suspect fluctuating Dilantin levels are contributing.  She was initially toxic on Dilantin on recent admission, and now subtherapeutic on this admission.  1. Would recommend continuing Dilantin at the current level.  Ensure compliance and stability of Dilantin levels.   2. Agree with stopping Keppra; she did not tolerate Keppra from the side effect standpoint.  I doubt that Keppra triggered seizures, although patient feels that Keppra has caused her to have breakthough seizures. 3. If the patient has further seizures, would be to add Vimpat (50mg  BID x 1 week; then 100mg  BID). 4. The patient also has been started on gabapentin low-dose for suspected peripheral neuropathy.  Ok from neuro standpoint to continue.  Thank you for this consultation.     Joycelyn Schmid, MD     VP/MEDQ  D:  08/28/2010  T:  08/28/2010  Job:  [id number]  cc:   [facility]  Electronically Signed by Joycelyn Schmid  on 08/28/2010 03:06:45 PM

## 2010-08-29 LAB — CBC
Hemoglobin: 12.1 g/dL (ref 12.0–15.0)
MCH: 34.8 pg — ABNORMAL HIGH (ref 26.0–34.0)
MCV: 102.3 fL — ABNORMAL HIGH (ref 78.0–100.0)
Platelets: 120 10*3/uL — ABNORMAL LOW (ref 150–400)
RBC: 3.48 MIL/uL — ABNORMAL LOW (ref 3.87–5.11)

## 2010-08-29 LAB — DIFFERENTIAL
Eosinophils Absolute: 0.1 10*3/uL (ref 0.0–0.7)
Lymphs Abs: 1.8 10*3/uL (ref 0.7–4.0)
Monocytes Relative: 8 % (ref 3–12)
Neutrophils Relative %: 42 % — ABNORMAL LOW (ref 43–77)

## 2010-08-29 NOTE — Discharge Summary (Addendum)
[name], [name]                ACCOUNT NO.:  [id number]  MEDICAL RECORD NO.:  [id number]          PATIENT TYPE:  INP  LOCATION:  1538                         FACILITY:  [facility]  PHYSICIAN:  Rock Nephew, MD       DATE OF BIRTH:  [birth year]  DATE OF ADMISSION:  08/25/2010 DATE OF DISCHARGE:  08/29/2010                        DISCHARGE SUMMARY - REFERRING   PRIMARY CARE PHYSICIAN:  The patient does not have one right now, but she wishes to see Dr. Pearson Grippe.  CONSULTING NEUROLOGIST:  Joycelyn Schmid, MD  DISCHARGE DIAGNOSES:  For the patient are as follows: 1. Recurrent seizure with subtherapeutic Dilantin level initially. 2. Left hip and lumbar back pain, stable. 3. Tobacco abuse counseled. 4. History of patient with possible peripheral neuropathy, started on     Neurontin. 5. History of intracerebral hemorrhage and arteriovenous     malformations, stable. 6. Status post right occipital craniotomy. 7. History of Dilantin toxicity.  DISCHARGE MEDICATIONS:  For the patient are as follows: 1. Gabapentin 100 mg p.o. daily. 2. Phenytoin 300 mg p.o. daily and 100 mg p.o. daily; 400 mg p.o.     daily total. 3. Combivent 2 puffs inhaled every 6 hours as needed for shortness     breath. 4. NicoDerm, nicotine patch 20 mg 24 hours transdermally as needed.  DISPOSITION:  The patient is discharged home.  Diet is regular.  PROCEDURES PERFORMED:  The patient had a left hip x-ray which was normal.  The patient had sacrum and coccyx x-ray which was negative. The patient also had a lumbar spinal x-ray which was no acute traumatic finding.  Mild lower lumbar degenerative disk disease and degenerative facet disease.  The patient on a subsequent hospitalization had an MRI, on August 16, 2010, which showed no acute abnormality postsurgical. Encephalomalacia in the right occipital lobe is stable.  DIET:  Regular.  CONSULTATIONS:  On this case were Dr. Joycelyn Schmid,  [facility].  FOLLOWUP:  The patient should follow up with Dr. Pearson Grippe within 1 week.  The patient should also follow up with Dr. Dr. Joycelyn Schmid or another member of [facility] in 2-4 weeks.  INITIAL HISTORY AND PHYSICAL:  Chief complaint of seizure.  This is a 55- year-old woman with history of seizures and intracranial hemorrhage status post craniotomy in 1988.  The patient has had multiple ED visits over the last couple weeks for seizures.  The patient was in emergency room yesterday due to seizures and was found to have a subtherapeutic Dilantin level, so she was loaded on Dilantin and her home dose was increased and she was sent home.  The patient reports that today over the course of the day, she had at least 2-3 seizures witnessed by her mother who she lives with.  The patient came back to the hospital on August 25, 2010.  The patient's Dilantin level was still low at 9.4.  HOSPITAL COURSE: 1. Recurrent seizures.  The patient again has had recurrent seizures.     The patient's, when she presented to the ED on August 24, 2010,     Dilantin level was 4.6.  She was loaded, sent home.  She re-     presented back for recurrent seizures.  Dilantin level was 9.4.     The patient supposedly had a seizure in the hospital.  The patient     was subsequently placed on Keppra also, however, at Keppra she had     intolerance to [facility], which she thought caused peripheral     neuropathy.  The Keppra was stopped and the patient was continued     back on Dilantin.  The patient's Dilantin level has been     therapeutic for 3 consecutive days now.  [place] [facility]     consultation was obtained on August 28, 2010.  They recommended     that the patient continue on Dilantin.  If the patient has any     subsequent seizures, the patient should be started on Vimpat. 2. Left hip and lumbar back pain.  The patient has some left hip and     lumbar back pain that has since  improved.  The patient had negative     x-rays. 3. Tobacco abuse.  The patient was counseled.  The patient will be     discharged on nicotine patch which she had at home. 4. Peripheral neuropathy.  The patient has possible peripheral     neuropathy and the patient was placed on Neurontin for that. 5. History of intercerebral hemorrhage, AV malformation, history of     craniotomy which has been stable.  The treatment of AV     malformations were obtained from the consultation by Dr. Marjory Lies. 6. DVT prophylaxis.  The patient received Lovenox for DVT prophylaxis.  Please note that this is not an official document until electronically signed     Rock Nephew, MD     NH/MEDQ  D:  08/29/2010  T:  08/29/2010  Job:  [id number]  cc:   Massie Maroon, MD Fax: [phone]  Joycelyn Schmid, MD Fax: [phone]  Electronically Signed by Rock Nephew MD on 08/29/2010 04:34:33 PM

## 2010-08-30 NOTE — Discharge Summary (Addendum)
NAMEMARQUISE, [name]                ACCOUNT NO.:  [id number]  MEDICAL RECORD NO.:  [id number]          PATIENT TYPE:  INP  LOCATION:  1503                         FACILITY:  [facility]  PHYSICIAN:  Richarda Overlie, MD       DATE OF BIRTH:  09/09/1955  DATE OF ADMISSION:  08/15/2010 DATE OF DISCHARGE:  08/17/2010                        DISCHARGE SUMMARY - REFERRING   PRIMARY CARE PHYSICIAN:  Donia Guiles, M.D.  PRIMARY UROLOGIST:  Deanna Artis. Sharene Skeans, M.D.  DISCHARGE DIAGNOSES: 1. Dilantin toxicity. 2. Seizure disorder with recurrent seizures. 3. History of asthma with acute-on-chronic bronchitis. 4. Nicotine dependence.  PROCEDURES: 1. MRI of the brain without contrast shows no acute abnormality,     postsurgical encephalomalacia in the right occipital lobe. 2. Chest x-ray:  Airway thickening may reflect bronchitis or reactive     airway disease with no air space opacities to suggest bacterial     pneumonia. 3. Dilantin level before discharge was 19.6.  Urinalysis negative.     TSH 1.179. 4. CT of the head without contrast shows no acute abnormalities.  SUBJECTIVE:  This is a 55 year old female with a history of seizure disorder followed by Dr. Ellison Carwin who presented to the ED because of a history of "recurrent seizures".  The patient had a Dilantin level of 36.4 when she presented.  Apparently she was noted to be lethargic and was also noted to have two episodes of tonic-clonic seizures within a day.  The patient was found to have some postictal confusion and drowsiness.  According to the patient she was supposed to be on Keppra 500 mg p.o. twice a day.  The patient decreased the dose to 250 mg p.o. twice daily after notifying Dr. Sharene Skeans.  She states that she has been on Dilantin for more than 20 years and has been taking the dose of 400 mg p.o. twice a day.  The patient was admitted for further evaluation.  HOSPITAL COURSE: 1. Dilantin toxicity probably resulting  from either excess therapeutic     dosing versus dehydration in the setting of her acute bronchitis     versus use of over-the-counter medications and drug interaction.     The patient was monitored closely for Dilantin toxicity on     telemetry.  Her unsteadiness and gait ataxia resolved during the     course of her hospitalization.  She did not have any recurrent     seizures.  The dose of her Keppra was increased to 500 mg p.o.     twice a day.  We have restarted the Dilantin and advised her to     take only 200 mg at bedtime.  She will need a repeat Dilantin level     in about one week.  She is to follow up with Dr. Ellison Carwin     early next week to ensure that the patient's medication adjustments     have been reasonable and also that the patient is not demonstrating     any signs of Dilantin toxicity.  At this point in time the patient     does not need any further  workup. 2. Acute bronchitis.  The patient was started on Z-Pak and DuoNeb     treatments.  A chest x-ray was consistent with acute bronchitis.     The patient was counseled about smoking cessation and has been     provided with a nicotine patch.  DISCHARGE MEDICATIONS: 1. Azithromycin 500 p.o. daily for 5 days. 2. Combivent two puffs inhaled q.6 p.r.n. 3. Nicotine patch 14 mg per 24 hours daily. 4. Dilantin 200 mg p.o. daily. 5. Keppra 500 mg p.o. twice daily.     Richarda Overlie, MD     NA/MEDQ  D:  08/17/2010  T:  08/17/2010  Job:  [id number]  cc:   Deanna Artis. Sharene Skeans, M.D. Fax: [phone]  Donia Guiles, M.D. Fax: [phone]  Electronically Signed by Richarda Overlie MD on 08/30/2010 07:26:51 AM

## 2010-09-17 ENCOUNTER — Emergency Department (HOSPITAL_COMMUNITY)
Admission: EM | Admit: 2010-09-17 | Discharge: 2010-09-17 | Disposition: A | Discharge status: Home / Self Care | Payer: Medicare Other | Attending: Emergency Medicine | Admitting: Emergency Medicine

## 2010-09-17 DIAGNOSIS — F05 Delirium due to known physiological condition: Secondary | ICD-10-CM | POA: Insufficient documentation

## 2010-09-17 DIAGNOSIS — G40909 Epilepsy, unspecified, not intractable, without status epilepticus: Secondary | ICD-10-CM | POA: Insufficient documentation

## 2010-09-17 DIAGNOSIS — Z79899 Other long term (current) drug therapy: Secondary | ICD-10-CM | POA: Insufficient documentation

## 2010-09-17 LAB — DIFFERENTIAL
Eosinophils Relative: 1 % (ref 0–5)
Lymphocytes Relative: 35 % (ref 12–46)
Lymphs Abs: 1.3 10*3/uL (ref 0.7–4.0)
Monocytes Absolute: 0.2 10*3/uL (ref 0.1–1.0)
Monocytes Relative: 6 % (ref 3–12)

## 2010-09-17 LAB — COMPREHENSIVE METABOLIC PANEL
Alkaline Phosphatase: 60 U/L (ref 39–117)
BUN: 7 mg/dL (ref 6–23)
Calcium: 8.9 mg/dL (ref 8.4–10.5)
GFR calc non Af Amer: >60 mL/min (ref >60)
Glucose, Bld: 96 mg/dL (ref 70–99)
Total Protein: 6 g/dL (ref 6.0–8.3)

## 2010-09-17 LAB — CBC
HCT: 35.9 % — ABNORMAL LOW (ref 36.0–46.0)
MCH: 35.4 pg — ABNORMAL HIGH (ref 26.0–34.0)
MCHC: 34.8 g/dL (ref 30.0–36.0)
MCV: 101.7 fL — ABNORMAL HIGH (ref 78.0–100.0)
RDW: 12.8 % (ref 11.5–15.5)

## 2010-09-17 LAB — PHENYTOIN LEVEL, TOTAL: Phenytoin Lvl: 26.7 ug/mL — ABNORMAL HIGH (ref 10.0–20.0)

## 2010-09-30 ENCOUNTER — Emergency Department (HOSPITAL_COMMUNITY)
Admission: EM | Admit: 2010-09-30 | Discharge: 2010-09-30 | Disposition: A | Discharge status: Home / Self Care | Payer: Medicare Other | Attending: Emergency Medicine | Admitting: Emergency Medicine

## 2010-09-30 DIAGNOSIS — R6883 Chills (without fever): Secondary | ICD-10-CM | POA: Insufficient documentation

## 2010-09-30 DIAGNOSIS — F29 Unspecified psychosis not due to a substance or known physiological condition: Secondary | ICD-10-CM | POA: Insufficient documentation

## 2010-09-30 DIAGNOSIS — G40909 Epilepsy, unspecified, not intractable, without status epilepticus: Secondary | ICD-10-CM | POA: Insufficient documentation

## 2010-09-30 LAB — COMPREHENSIVE METABOLIC PANEL
ALT: 15 U/L (ref 0–35)
AST: 20 U/L (ref 0–37)
Albumin: 3.7 g/dL (ref 3.5–5.2)
Alkaline Phosphatase: 61 U/L (ref 39–117)
BUN: 5 mg/dL — ABNORMAL LOW (ref 6–23)
Chloride: 105 mEq/L (ref 96–112)
Potassium: 3.8 mEq/L (ref 3.5–5.1)
Sodium: 138 mEq/L (ref 135–145)
Total Bilirubin: 0.4 mg/dL (ref 0.3–1.2)
Total Protein: 5.8 g/dL — ABNORMAL LOW (ref 6.0–8.3)

## 2010-09-30 LAB — DIFFERENTIAL
Basophils Absolute: 0 10*3/uL (ref 0.0–0.1)
Eosinophils Absolute: 0 10*3/uL (ref 0.0–0.7)
Lymphocytes Relative: 19 % (ref 12–46)
Lymphs Abs: 0.8 10*3/uL (ref 0.7–4.0)
Neutrophils Relative %: 77 % (ref 43–77)

## 2010-09-30 LAB — CBC
MCV: 102.1 fL — ABNORMAL HIGH (ref 78.0–100.0)
Platelets: 127 10*3/uL — ABNORMAL LOW (ref 150–400)
RBC: 3.75 MIL/uL — ABNORMAL LOW (ref 3.87–5.11)
WBC: 4.2 10*3/uL (ref 4.0–10.5)

## 2010-10-03 ENCOUNTER — Emergency Department (HOSPITAL_COMMUNITY)
Admission: EM | Admit: 2010-10-03 | Discharge: 2010-10-03 | Disposition: A | Discharge status: Home / Self Care | Payer: Medicare Other | Attending: Emergency Medicine | Admitting: Emergency Medicine

## 2010-10-03 DIAGNOSIS — G40909 Epilepsy, unspecified, not intractable, without status epilepticus: Secondary | ICD-10-CM | POA: Insufficient documentation

## 2010-10-03 LAB — PHENYTOIN LEVEL, TOTAL: Phenytoin Lvl: 17.7 ug/mL (ref 10.0–20.0)

## 2010-10-16 LAB — URINALYSIS, ROUTINE W REFLEX MICROSCOPIC
Bilirubin Urine: NEGATIVE
Bilirubin Urine: NEGATIVE
Glucose, UA: NEGATIVE mg/dL
Glucose, UA: NEGATIVE mg/dL
Hgb urine dipstick: NEGATIVE
Ketones, ur: NEGATIVE mg/dL
Ketones, ur: NEGATIVE mg/dL
Nitrite: NEGATIVE
Nitrite: NEGATIVE
Protein, ur: 30 mg/dL — AB
Protein, ur: NEGATIVE mg/dL
Specific Gravity, Urine: 1.007 (ref 1.005–1.030)
Specific Gravity, Urine: 1.013 (ref 1.005–1.030)
Urobilinogen, UA: 0.2 mg/dL (ref 0.0–1.0)
Urobilinogen, UA: 0.2 mg/dL (ref 0.0–1.0)
pH: 7 (ref 5.0–8.0)
pH: 7 (ref 5.0–8.0)

## 2010-10-16 LAB — COMPREHENSIVE METABOLIC PANEL
AST: 27 U/L (ref 0–37)
Albumin: 4 g/dL (ref 3.5–5.2)
Alkaline Phosphatase: 57 U/L (ref 39–117)
BUN: 5 mg/dL — ABNORMAL LOW (ref 6–23)
CO2: 27 mEq/L (ref 19–32)
Chloride: 105 mEq/L (ref 96–112)
GFR calc Af Amer: >60 mL/min (ref >60)
GFR calc non Af Amer: >60 mL/min (ref >60)
Sodium: 139 mEq/L (ref 135–145)
Total Bilirubin: 0.6 mg/dL (ref 0.3–1.2)

## 2010-10-16 LAB — POCT I-STAT, CHEM 8
BUN: 6 mg/dL (ref 6–23)
Calcium, Ion: 1.12 mmol/L (ref 1.12–1.32)
Chloride: 102 mEq/L (ref 96–112)
Creatinine, Ser: 0.8 mg/dL (ref 0.4–1.2)
Glucose, Bld: 73 mg/dL (ref 70–99)
HCT: 43 % (ref 36.0–46.0)
Hemoglobin: 14.6 g/dL (ref 12.0–15.0)
Potassium: 3.6 mEq/L (ref 3.5–5.1)
Sodium: 141 mEq/L (ref 135–145)
TCO2: 29 mmol/L (ref 0–100)

## 2010-10-16 LAB — URINE CULTURE: Colony Count: 75000

## 2010-10-16 LAB — PHENYTOIN LEVEL, TOTAL: Phenytoin Lvl: 35.9 ug/mL (ref 10.0–20.0)

## 2010-10-16 LAB — CBC
HCT: 40 % (ref 36.0–46.0)
MCHC: 34 g/dL (ref 30.0–36.0)
RDW: 13.8 % (ref 11.5–15.5)

## 2010-10-28 LAB — PHENYTOIN LEVEL, TOTAL: Phenytoin Lvl: 25.4 ug/mL — ABNORMAL HIGH (ref 10.0–20.0)

## 2010-10-28 LAB — URINALYSIS, ROUTINE W REFLEX MICROSCOPIC
Glucose, UA: NEGATIVE mg/dL
Ketones, ur: NEGATIVE mg/dL
Nitrite: NEGATIVE
Protein, ur: NEGATIVE mg/dL
Urobilinogen, UA: 0.2 mg/dL (ref 0.0–1.0)

## 2010-10-28 LAB — POCT I-STAT, CHEM 8
BUN: 8 mg/dL (ref 6–23)
Calcium, Ion: 1.14 mmol/L (ref 1.12–1.32)
Hemoglobin: 14.6 g/dL (ref 12.0–15.0)
TCO2: 33 mmol/L (ref 0–100)

## 2010-10-28 LAB — CBC
MCHC: 33.8 g/dL (ref 30.0–36.0)
MCV: 105.9 fL — ABNORMAL HIGH (ref 78.0–100.0)
Platelets: 146 10*3/uL — ABNORMAL LOW (ref 150–400)
RDW: 12.7 % (ref 11.5–15.5)

## 2010-10-28 LAB — DIFFERENTIAL
Basophils Absolute: 0.1 10*3/uL (ref 0.0–0.1)
Basophils Relative: 1 % (ref 0–1)
Eosinophils Absolute: 0.1 10*3/uL (ref 0.0–0.7)
Neutrophils Relative %: 50 % (ref 43–77)

## 2010-10-28 LAB — POCT PREGNANCY, URINE: Preg Test, Ur: NEGATIVE

## 2010-11-05 LAB — POCT I-STAT, CHEM 8
Calcium, Ion: 1.12 mmol/L (ref 1.12–1.32)
Chloride: 106 mEq/L (ref 96–112)
Glucose, Bld: 103 mg/dL — ABNORMAL HIGH (ref 70–99)
HCT: 40 % (ref 36.0–46.0)

## 2010-11-12 LAB — URINALYSIS, ROUTINE W REFLEX MICROSCOPIC
Bilirubin Urine: NEGATIVE
Glucose, UA: NEGATIVE mg/dL
Ketones, ur: NEGATIVE mg/dL
Nitrite: NEGATIVE
pH: 6 (ref 5.0–8.0)

## 2010-12-17 NOTE — H&P (Signed)
NAMEHARMONIE, [name] NO.:  [id number]   MEDICAL RECORD NO.:  [id number]          PATIENT TYPE:  EMS   LOCATION:  MAJO                         FACILITY:  [facility]   PHYSICIAN:  Mick Sell, MD DATE OF BIRTH:  04/26/1956   DATE OF ADMISSION:  04/20/2007  DATE OF DISCHARGE:  04/20/2007                              HISTORY & PHYSICAL   PRIMARY DOCTOR:  Dr. Donia Guiles   CHIEF COMPLAINT:  Seizure.   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  She  has been on antiseizure meds for many years, specifically Dilantin.  She  states she usually has good control with this and that her level usually  runs in the 20s range.  She was admitted on September 12 with some vague  symptoms of weakness and loss of appetite, and at that point her  Dilantin level was found to be elevated at 36.  This was held and her  Dilantin dosing was decreased from 200 in the morning, 300 in the  evening to 100 mg twice a day.  She was then discharged and actually  reports several seizures in the last week.  She believes it is due to  the decreased dose of her Dilantin.  She also has some continued vague  complaints of not feeling well, but she cannot specify it much better.  She said she has lost weight, about 14 to 20 pounds, in the last six  months; she has had decreased energy and some mild abdominal pain.  She  has not had any nausea, vomiting or diarrhea with this.  She has not had  any chest pain or cough.  She does smoke a pack-and-a-half a day and has  done so for many years.   PAST MEDICAL HISTORY:  1. Tobacco abuse with possible early COPD.  2. Asthma.  3. History of cerebral hemorrhage with history of seizures.  4. Recurrent seizures.   MEDICATIONS:  Her only current mediation is Dilantin 100 mg twice a day  which was recently decreased.   ALLERGIES:  SHE IS APPARENTLY ALLERGIC TO CODEINE, LAMICTAL, KEPPRA,  DEPAKOTE, TEGRETOL AND SORBITOL.   SOCIAL HISTORY:  She lives with her son, is not currently working and  smokes a pack-and-a-half a day.  She denies any alcohol or drug use.   FAMILY HISTORY:  Positive for skin cancer in her mother and brain cancer  in her father.   PHYSICAL EXAMINATION TODAY:  GENERAL:  She is a pleasant, interactive  white female in no acute distress, slightly disheveled.  VITAL SIGNS:  Her temperature is 97.8, pulse of 87, blood pressure  116/71, respirations 20, saturation 94% on room air.  HEENT:  Pupils are equal, round and reactive to light and accommodation.  Extraocular movements are intact.  Sclerae are anicteric.  Oropharynx is  clear.  NECK:  Supple.  HEART:  Regular.  No murmurs, rubs or gallops.  LUNGS:  Clear to auscultation.  ABDOMEN:  Soft, nontender, nondistended.  No hepatosplenomegaly.  EXTREMITIES:  No clubbing, cyanosis or edema.  SKIN:  She has an area on middle of her back that is dry and flaky and  slightly hyperpigmented approximately 4 cm across.  She said  this has  been present for several months to years, but is unsure of the exact  duration.   DATA:  Patient had basic blood work done today with white count of 4.4,  hemoglobin 13.6, platelets of 153.  Her sodium was 143, potassium 3.4,  chloride 105, CO2 30, BUN 8, creatinine 0.69, glucose of 81.  Her LFTs  are within normal limits.  Total protein is decreased at 5.5.  Amylase  and lipase are within normal limits.  Dilantin level is 16.  A  urinalysis from two days ago was negative.   ASSESSMENT:  This is a 55 year old female, history of cerebrovascular  accident with residual seizure disorder, who presents with recurrent  seizures over the last several days increasing in frequency since her  Dilantin was decreased with a recent admission.  She also has vague  symptoms of fatigue  and not feeling well, decreased energy and abdominal  pain and a 14-pound weight loss over the last six months.   PLAN:  1. Seizures.  I have increased her Dilantin level up to 200 mg twice a      day; she may need to run at a higher level in order to prevent      seizures.  She also needs to be seen by Dr. Sharene Skeans in neurology,      her primary neurologist, for further evaluation; he will be      consulted today.  2. Weight loss.  This is a concerning sign in this woman, long-term      smoker, who seems to be declining over the last several months.      Will initiate workup of her abdominal pain with a CT of her abdomen      and pelvis.  Will also check a CT of her chest given her long      smoking history.  She may need further workup as an outpatient      including a colonoscopy and mammogram.  3. Skin.  She has an area of hyperpigmentation and dry skin on her      back which would be best evaluated by dermatology, perhaps as an      outpatient.  4. FEN:  Patient will begin a regular diet and get some gentle IV      hydration overnight.      Mick Sell, MD  Electronically Signed     DPF/MEDQ  D:  04/20/2007  T:  04/20/2007  Job:  [id number]

## 2010-12-17 NOTE — Procedures (Signed)
EEG NUMBER:  [id number].   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  The only medication listed was Dilantin.  The patient had  a flurry of approximately seven seizures in the past 3 days with  Dilantin levels ranging from 36-15.  This was a routine 17-channel EEG  with one channel devoted to EKG utilizing [employer] 10/20 lead  placement system.  The patient was described as being awake clinically.  Electrographically she also appeared to be awake.  The background  consists of a well-organized well-developed, well modulated 10 Hz alpha  activity in the left posterior head regions slower rhythms were seen in  the 7 Hz range in the right hemisphere, frequent spike and slow wave  complexes and sharp wave complexes seen from the posterior right head  region consistent with seizure focus.  No long and trained seizure  episodes were seen; however, and no clinical activity was described  associated with these isolated although frequent discharges.  Hyperventilation did not produce significant change in background  activity.  Photic stimulation was also performed and did not produce any  significant change in the background activity.  The EKG monitor reveals  relatively regular rhythm with a rate of 72 beats per minute.   CONCLUSION:  Abnormal EEG demonstrating epileptiform discharges from the  posterior right hemisphere consistent with the patient's history of  complex partial seizures related to right posterior AVM.  No entrained  generalized seizure activity was seen.  Clinical correlation is  recommended.      [name], M.D.  Electronically  Signed     ZOX:WRUE  D:  04/20/2007 16:11:00  T:  04/21/2007 09:18:42  Job #:  [id number]   cc:   [name], M.D.  Fax: [phone]

## 2010-12-17 NOTE — Consult Note (Signed)
[name] [name] [name], [name] [name] [name]                ACCOUNT NO.:  [id number]   MEDICAL RECORD NO.:  [id number]          [name] TYPE:  EMS   LOCATION:  MAJO                         FACILITY:  [facility]   PHYSICIAN:  [name] [name] [name]. [name] [name] [name], M.D.DATE OF BIRTH:  12-18-1955   DATE OF CONSULTATION:  04/20/2007  DATE OF DISCHARGE:                                 CONSULTATION   REFERRING PHYSICIAN:  Dr. Deanna [name]. [name] [name] [name].   CHIEF COMPLAINT:  Seizure.   HISTORY OF PRESENT ILLNESS:  [name] [name] [name] is a 55 year old woman with a  longstanding history, 18-19 years back, of complex partial seizures  which are intractable with secondary generalization due to an AVM.  She  had resection of this, presumably associated with hemorrhage a couple of  decades ago.  She was followed by Dr. Sharene [name] and was last seen by him  3 months ago on January 20, 2007.  According to [name] [name] notes available from  [name] [name] office on that date, her Dilantin dose is 100 mg, two in [name] [name] morning  and three at night, and according to Dr. Darl [name] note, she has been  on that dose for some time.  However, [name] [name] [name] says that she is on  100 mg, two in [name] [name] morning and two at night.  Dr. Gloris [name] notes also  indicate she has not been able to tolerate any other medicine for  seizures or other medicines have not been effective and that Dilantin  has been [name] [name] only thing they have been able to maintain her on.  She has  done well in general without lots of generalization.  She has had a  least one admission for toxicity in [name] [name] past.  Apparently, [name] [name] [name]  showed up in [name] [name] emergency room on September 12 complaining of  dizziness, generalized weakness, and loss of appetite.  She said she had  a sudden onset of dizziness and staggering; she called [name] [name] paramedics.  CT of [name] [name] head was negative for anything acute.  A Dilantin level was  checked at about 1 a.m. and was 36.8; however, [name] [name] [name] states that  this was fairly soon after she had taken her nighttime dose  of Dilantin.  Because of this, she was admitted.  Her Dilantin was presumably held and  no discharge summary is available to summarize her course, but about 12  hours or less later, Dilantin level was 23.7, which would support [name] [name]  [name]'s premise that [name] [name] initial level was truly a peak level rather  than a trough, which would indicate toxicity.  No discharge summary is  available, but [name] [name] [name] was apparently discharged [name] [name] next day and  she says that she was told to reduce her Dilantin to 100 mg twice a day.  She presented to [name] [name] emergency room [name] [name] next day, [name] [name] 14th, with a level  was 15.9, which is considerably down from [name] [name] 912 level, was treated in  [name] [name] emergency room, but not admitted.  However, she showed up again in  [name] [name] emergency room on [name] [name] 16th with a Dilantin level of 16 with another  couple of seizures; after having had a  total of perhaps 7 over [name] [name] 2- or  3-day period, she was admitted.  Once again, it is difficult to tell  exactly what [name] [name] Dilantin level was supposed to be, what it was; her  history differs from that it is documented in [name] [name] charts, although [name] [name]  documentation of her actual Dilantin level is poor.  No discharge  summary is available for her take-home medications and [name] [name] ER reports  from [name] [name] 12th, 14th and 16th do not indicate what her dose was supposed  to be at [name] [name] time.  Currently, she feels perfectly fine and at baseline.  She has no dizziness. no generalized weakness, no nausea, no prodromal  symptoms of her typical seizure.  Generally, when she has a seizure, it  is preceded by some left arm focal motor activity and a strange taste in  her mouth and then it may proceed to generalized.   REVIEW OF SYSTEMS:  Out of a 12-system review that includes  cardiovascular, pulmonary, neurologic, muscular, skin, GI, GU,  hematologic, endocrinologic, ENT, psychiatric, suicide and sleep, it is  positive for some weight loss, nausea, abdominal pain, seizures,  dry  area on upper back, dyspnea.   PAST MEDICAL HISTORY:  Significant for [name] [name] AVM years ago and [name] [name] chronic  seizure disorder.  She is suspected of having COPD and asthma.   MEDICATIONS:  Once again, medication according to [name] [name] [name], Dilantin  200 mg in [name] [name] morning and 200 at night; according to [name] [name] H&P from  April 16, 2007 and according to Dr. Gloris [name] note from January 21, 2007, it is supposed to be 200 in [name] [name] morning and 300 at night.   ALLERGIES:  CODEINE, LAMICTAL, KEPPRA, DEPAKOTE, TEGRETOL and SORBITOL.   SOCIAL HISTORY:  She is a smoker.  No alcohol or drug use.   FAMILY HISTORY:  Positive for skin cancer.   OBJECTIVE:  VITAL SIGNS:  Temperature is 98.1, pulse 89, respirations  20, BP 107/74, 96% SAT on 2 L.  HEENT:  Head is normocephalic, atraumatic.  No tongue lacerations.  NECK:  Supple without bruits.  HEART:  Regular rate and rhythm.  NEUROLOGIC:  She is awake and alert with normal language.  She is  appropriate.  CRANIAL NERVES:  Pupils are equal and reactive.  She has a left  homonymous hemianopsia.  Extraocular movements are intact without  nystagmus.  There is no facial asymmetry.  She has normal facial  sensation.  Hearing is intact.  Palate is symmetric and tongue is  midline.  She has no dysarthria.  On motor exam, there is no drift.  She  has normal bulk, tone and strength throughout.  Deep tendon reflexes are  2+.  Coordination with finger-to-nose and heel-to-shin are normal.  Sensory is normal.   LABORATORY AND ACCESSORY CLINICAL DATA:  CT shows nothing acute, but  shows old postsurgical changes in [name] [name] right parieto-occipital region  with encephalomalacia, porencephaly and a craniotomy defect.   Dilantin levels as listed included Dilantin level of 36.8 at 1 a.m. on  [name] [name] 12th, Dilantin level about a day later 23.7, Dilantin level of 15.9  a day after that and Dilantin level of 16 two days after that.   IMPRESSION:  Seizures.  She has had a flurry  of seizures. This [name]  has an intractable complex partial seizure disorder with occasional  generalization.  She had been on Dilantin, but there is a discrepancy in  [name] [name] [name]'s oral history of what her medication doses are  and [name] [name]  documented doses in [name] [name] chart.  According to Dr. Merleen Milliner notes, she has  tolerated [name] [name] Dilantin well in general for years with perhaps one  hospitalization for toxicity, but good seizure control in general, and  she has been tried on most other seizure medicines with either poor  tolerance or poor efficacy.  According to [name] [name] [name], [name] [name] level that  was deemed toxic was drawn as a peak just after her dose and thereafter  she may have had her dose decreased precipitously.   RECOMMENDATIONS:  Would check an EEG.  Would return to her claimed  baseline of Dilantin 100 mg two twice a day.  She could be discharged  any time.  No driving.  Follow up with Dr. Sharene [name].      Catherine A. [name] [name] [name], M.D.  Electronically Signed     CAW/MEDQ  D:  04/20/2007  T:  04/21/2007  Job:  [id number]

## 2010-12-17 NOTE — H&P (Signed)
NAMEJAELEAH, SMYSER                ACCOUNT NO.:  [id number]   MEDICAL RECORD NO.:  [id number]          PATIENT TYPE:  EMS   LOCATION:  MAJO                         FACILITY:  [facility]   PHYSICIAN:  Kela Millin, M.D.DATE OF BIRTH:  September 20, 1955   DATE OF ADMISSION:  04/16/2007  DATE OF DISCHARGE:                              HISTORY & PHYSICAL   PRIMARY CARE PHYSICIAN:  Dr. Donia Guiles.   CHIEF COMPLAINT:  Dizziness.   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  She says that she has  been in good control with medication and that the closest her Dilantin  level has ever exceeded above normal values has been around 21, range  being from 10-20.  For the last 2 days, she has been feeling very weak  and loss of appetite, and today she went to bed early.  When she got up  around 10 p.m. to go use the bathroom, all of a sudden she felt  extremely dizzy and was staggering as if she could not get her balance.  She became quite concerned and called the paramedics.  The patient was  brought in and a CT scan of the head was negative.  Labs were done on  the patient including an EKG was normal, cardiac markers and a CMET and  CBC, all of which were unremarkable, except for an MCV of around 104.  A  Dilantin level was checked and found to be markedly elevated at 37.  The  patient could not explain why her Dilantin level was so high and given  her sudden onset of symptoms, it was felt best that she come into the  hospital for further evaluation.  Currently, she is doing okay.  She  feels very weak, slightly nausea, but denies any headaches or vision  changes.  No dysphagia.  No chest pain, palpitations or shortness of  breath, although she was complaining of shortness of breath  earlier when  she was stumbling at home, which has now resolved.  No abdominal pain.  No wheezing or coughing.  No hematuria, dysuria, constipation or  diarrhea.  No focal extremity numbness, weakness or pain.  Review of  systems is otherwise negative.   PAST MEDICAL HISTORY:  1. Tobacco abuse, suspected early signs of COPD from tobacco use.  2. Asthma.  3. History of cerebral hemorrhage, on chronic anti-seizure      medications.   MEDICATIONS:  She is just on Dilantin 200 mg in the morning, 300 mg at  night.   ALLERGIES:  She has allergies to CODEINE, LAMICTAL, KEPPRA, DEPAKOTE,  TEGRETOL and SORBITOL.   SOCIAL HISTORY:  She smokes about a pack and a half per day, denies any  alcohol or drug use.   FAMILY HISTORY:  Noted for skin cancer.   PHYSICAL EXAMINATION:  VITALS ON ADMISSION:  Temperature 97.6, heart  rate 82, blood pressure  92/59, respirations 22, O2 SAT 93% on room air.  GENERAL:  She is alert and oriented x3 in no apparent distress.  HEENT:  Normocephalic, atraumatic.  Her mucous membranes are slightly  dry.  She has no carotid bruits.  HEART:  Regular rate and rhythm.  S1 and S2.  LUNGS:  Clear to auscultation bilaterally.  ABDOMEN:  Soft, nontender and non-distended.  Positive bowel sounds.  EXTREMITIES:  No clubbing, cyanosis or edema.   LABORATORY WORK:  White count 5, H&H 14.3 and 42, MCV of 104, platelet  count 150,000, normal differential.  Sodium 141, potassium 2.9, chloride  107, bicarb 26, BUN 8, creatinine 0.7, glucose 100.  LFTs are  unremarkable.  Coagulations are normal.  UA normal.  Cardiac markers  normal.   EKG normal.   Chest x-ray shows early signs of COPD and emphysema.   Dilantin is elevated at 36.8.   ASSESSMENT AND PLAN:  1. Dilantin toxicity.  It is difficult to say why exactly the      patient's Dilantin level is so high.  She does not appear to be at      a decreased level of clearance.  Nevertheless, we will hold all of      her  Dilantin for now and continue to watch, keep her on a heart      murmur, continue watching for arrhythmias.  Her creatinine, CBC and      liver function tests all appear to be stable.  We will check in 24-      36 hours.  We will also check a folate level as per monitoring for      Dilantin.  2. Macrocytosis.  This may be indirectly related to her Dilantin      level.  We will check a B12 and folate level now.  3. Tobacco abuse.  I offered the patient a nicotine patch, which she      has accepted.  4. Early signs of chronic obstructive pulmonary disease/asthma.  As-      needed albuterol inhaler.      Hollice Espy, M.D.  Electronically Signed      Kela Millin, M.D.  Electronically Signed    SKK/MEDQ  D:  04/16/2007  T:  04/16/2007  Job:  [id number]   cc:   Donia Guiles, M.D.

## 2010-12-17 NOTE — H&P (Signed)
NAMEVONDRA, ALDREDGE                ACCOUNT NO.:  [id number]   MEDICAL RECORD NO.:  [id number]          PATIENT TYPE:  INP   LOCATION:  1438                         FACILITY:  [facility]   PHYSICIAN:  Cheron Every, MD   DATE OF BIRTH:  05/19/1956   DATE OF ADMISSION:  05/26/2007  DATE OF DISCHARGE:                              HISTORY & PHYSICAL   PRIMARY CARE PHYSICIAN:  Donia Guiles, M.D.   PRIMARY NEUROLOGIST:  Deanna Artis. Sharene Skeans, M.D. at [facility].   CHIEF COMPLAINT:  Seizure.   HISTORY OF PRESENT ILLNESS:  Ms. Donlan is a 55 year old female with  seizure disorder felt to be secondary to an AVM that was associated with  a cerebral hemorrhage during resection in the past that presents to the  emergency department after experiencing a seizure today.  Her first  seizure occurred around 2 o'clock in the afternoon and an ambulance was  called and she subsequently had three seizures since then.  She did  require 2 mg of Ativan while in the emergency department.  Most of  history incidentally is obtained from her mother as the patient had  received Ativan and does have some postictal symptoms.  The patient does  complain of abdominal pain as well as some loose stool. She was febrile  to 101.5 here.  She does have a nonproductive cough, but no shortness of  breath, no chest pain.  She denies any neck pain or headache.  Her  abdominal pain is on the left side, however, she is unable to  characterize it secondary to her mental status currently.  The mother is  unclear if she received a flu shot when she was recently seen at her  PCP's office, but denies any sick contacts.  Her mother denies her  taking any other medications other than her Dilantin.   PAST MEDICAL HISTORY:  1. History of cerebral AVM as well as cerebral hemorrhage.  2. Recurrent seizures.  3. Asthma.   MEDICATIONS:  1. Dilantin 100 mg b.i.d.  It does appear that this was decreased in      September when she  did have a supratherapeutic Dilantin level.      Mother states that she does take an occasional nerve pill.  No      other medications.   ALLERGIES:  CODEINE, BUT SHE IS UNCLEAR WHAT THE REACTION IS.   FAMILY HISTORY:  Positive for colon cancer in her mother, another cancer  in her brother.  Her mother is diabetic as well.   SOCIAL HISTORY:  The patient currently is not working, but she did use  to run a Restaurant manager, fast food.  She does smoke about one and one half  packs of cigarettes per day.  Denies any alcohol or other drug use.   REVIEW OF SYSTEMS:  All other systems are reviewed and negative except  previously stated.   PHYSICAL EXAMINATION:  Temperature is 101.5 rectally.  A repeat after  receiving Tylenol was 99.5 orally.  Blood pressure 122/56, heart rate  103, 93% on room air.  GENERAL:  The patient is  drowsy, but arousable.  Will answer questions.  HEENT:  Normocephalic, atraumatic.  Oropharynx is clear.  There is no  meningismus.  NECK:  Supple.  CARDIOVASCULAR:  Regular rate and rhythm.  No murmur, rub or gallop.  CHEST:  Clear to auscultation bilaterally.  ABDOMEN:  Positive bowel sounds, soft with tenderness to palpation in  the left upper quadrant and left lower quadrant.  There is no rebound or  guarding. She does have a prominent abdominal aortic pulse.  There is no  CVA tenderness.  EXTREMITIES: No edema.  She does move all her extremities.  NEUROLOGIC:  Difficult to obtain secondary to her mental status,  however, her cranial nerves are grossly intact.  Her deep tendon  reflexes of the upper extremities are 2+ bilaterally.   LABORATORY DATA:  Her white count is 4.8, hemoglobin 14.7, platelets 459  with 71% segs, Dilantin is 15.1.  Electrolytes are normal.  BUN and  creatinine are 7 and 0.8.  LFTs are normal.  Glucose is 123.  LFTs are  normal.  Chest x-ray shows no acute cardiopulmonary disease.   ASSESSMENT/PLAN:  1. Seizure.  This does appear to be  secondary to a febrile illness,      unclear etiology.  Question a GI virus versus respiratory illness      with her complaint of cough.  Her chest x-ray is normal, however,      she may possibly develop an infiltrate after hydration.  We will      check a KUB to better assess her abdominal pain.  We will check a      flu swab although it is slightly early in the flu season.  Blood      and urine cultures are pending.  We will continue to hydrate with      normal saline at 100 mL per hour.  Continue with her Dilantin 100      mg b.i.d. and use Ativan p.r.n. for seizure.  I did discuss her      case with the neurologist on call for Dr. Darl Householder practice, that      was Dr. Orlin [name], who is agreeable with this plan.  She recommends      for continuing to monitor overnight, possible discharge tomorrow.      To follow up in the clinic depending on what happens with her      fevers.  We will also check urine drug screen.  2. Fever.  Again, unclear etiology.  Pan culture.  Check UA. Work-up      as above.  We will schedule Tylenol in order to prevent her having      a seizure related to this fever.  We will hold on any antibiotics      at this time as there is no clear source for her fever and likely      this is probably viral in nature.      Cheron Every, MD  Electronically Signed     MLW/MEDQ  D:  05/26/2007  T:  05/27/2007  Job:  [id number]

## 2010-12-17 NOTE — Discharge Summary (Signed)
[name], OBERMAN                ACCOUNT NO.:  [id number]   MEDICAL RECORD NO.:  [id number]          PATIENT TYPE:  INP   LOCATION:  1444                         FACILITY:  [facility]   PHYSICIAN:  Barnetta Chapel, MDDATE OF BIRTH:  05-May-1956   DATE OF ADMISSION:  04/16/2007  DATE OF DISCHARGE:  04/17/2007                               DISCHARGE SUMMARY   PRIMARY CARE PHYSICIAN:  Donia Guiles, M.D.   DISCHARGE DIAGNOSES:  1. Dilantin toxicity.  2. Dizziness.  3. Weight loss.  4. Likely COPD.   CONSULTATIONS:  None.   MEDICATIONS:  Changes to the patient's medication includes:  1. Decrease Dilantin to 100 mg p.o. b.i.d.  2. Folic acid 1 mg p.o. once daily.   BRIEF HISTORY AND HOSPITAL COURSE:  Please refer to the H&P done on  April 16, 2007, for full details.  The patient is a 55 year old  female with past medical history significant for AVM that led to  cerebral hemorrhage.  The patient developed seizures after the  hemorrhage and has been on Dilantin since then.  The patient presented  with ataxia and dizziness.  On admission, the CT scan of the head done  was negative.  The MCV was 104 which could also be secondary to  dilantin.  Dilantin level on admission was significantly elevated (6.8).   The patient was admitted to the regular medical floor.  The patient's  Dilantin was held.  The dilantin level was monitored and on the day of  discharge the dilantin level was 23.7.  Dizziness and ataxia had  improved significantly prior to discharge.  On discharge, the Dilantin  level was decreased by half, i.e., 100 mg p.o. twice daily.  During the  patient's stay in the hospital, she reported significant weight loss.  Workup for the possible cause of weight loss has not revealed anything  of note.  The patient was also significantly depressed.   DISCHARGE PLAN:  1. Discharge the patient home today on Dilantin 100 mg b.i.d. and      folic acid 1 mg p.o. daily.  2. Follow  up with the neurologist, Dr Sharene Skeans.  Follow up with the      primary care Mani Celestin.  Check dilantin level in Dr. Darl Householder      office on Monday, April 19, 2007      Barnetta Chapel, MD  Electronically Signed     SIO/MEDQ  D:  05/04/2007  T:  05/05/2007  Job:  [id number]   cc:   Donia Guiles, M.D.  Fax: [phone]   Deanna Artis. Sharene Skeans, M.D.  Fax: [phone]

## 2010-12-17 NOTE — Discharge Summary (Signed)
NAMEVESTAL, [name]                ACCOUNT NO.:  [id number]   MEDICAL RECORD NO.:  [id number]          PATIENT TYPE:  INP   LOCATION:  5151                         FACILITY:  [facility]   PHYSICIAN:  Michelene Gardener, MD    DATE OF BIRTH:  Nov 05, 1955   DATE OF ADMISSION:  04/20/2007  DATE OF DISCHARGE:  04/21/2007                               DISCHARGE SUMMARY   PRIMARY PHYSICIAN:  Dr. Donia Guiles   DISCHARGE DIAGNOSES:  1. Seizure.  2. Weight loss.  3. Tobacco abuse.  4. Asthma.  5. History of cerebral hemorrhage.   DISCHARGE MEDICATIONS:  Dilantin 200 mg p.o. twice a day.   CONSULTATIONS:  Neuro consult with Dr. Marcelino Freestone done on  April 20, 2007.   PROCEDURES:  None.   FOLLOWUP APPOINTMENTS:  1. Dr. Marcelino Freestone in 1-2 weeks.  2. Dr. Donia Guiles in 1-2 weeks.   RADIOLOGY STUDIES:  1. 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  2. CT scan of the abdomen without contrast showed no acute finding.  3. CT scan of the chest showed no acute findings.  4. CT scan of the pelvis showed no acute finding.   COURSE OF HOSPITALIZATION:  SEIZURE.  This patient has recurrent  seizure, was admitted recently on September 12, was again readmitted on  September 16 with recurrent seizure.  The patient was seen by neurology.  Her Dilantin level was therapeutic.  The patient was put back on her  Dilantin which was at 200 mg twice daily as recommended by neurology.  Neurology saw her and recommended to put her back on her Dilantin and to  do an EEG.  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  The patient also underwent CT scan of the abdomen, chest and pelvis to  rule out any underlying malignancy  because of her  continuous weight loss.  All those results came to be normal.  The  patient will be discharged home today on Dilantin 200 mg twice a day.  She will be followed by her primary physician and by neurology.   Total assessment time is 40 minutes.      Michelene Gardener, MD  Electronically Signed     NAE/MEDQ  D:  04/21/2007  T:  04/21/2007  Job:  [id number]   cc:   Donia Guiles, M.D.  Deanna Artis. Sharene Skeans, M.D.

## 2010-12-18 ENCOUNTER — Emergency Department (HOSPITAL_COMMUNITY)
Admission: EM | Admit: 2010-12-18 | Discharge: 2010-12-18 | Disposition: A | Discharge status: Home / Self Care | Payer: Medicare Other | Attending: Emergency Medicine | Admitting: Emergency Medicine

## 2010-12-18 DIAGNOSIS — Z79899 Other long term (current) drug therapy: Secondary | ICD-10-CM | POA: Insufficient documentation

## 2010-12-18 DIAGNOSIS — F29 Unspecified psychosis not due to a substance or known physiological condition: Secondary | ICD-10-CM | POA: Insufficient documentation

## 2010-12-18 DIAGNOSIS — M549 Dorsalgia, unspecified: Secondary | ICD-10-CM | POA: Insufficient documentation

## 2010-12-18 DIAGNOSIS — G40909 Epilepsy, unspecified, not intractable, without status epilepticus: Secondary | ICD-10-CM | POA: Insufficient documentation

## 2010-12-20 NOTE — H&P (Signed)
. [facility]  Patient:    [name]                        MRN: [id number] Adm. Date:  [id number] Attending:  Lorre Nick                         History and Physical  HISTORY OF PRESENT ILLNESS:  [name] is a 55 year old, right-handed white female, born on 07-03-1956, with a history of seizures.  The patient was last seen at the [facility] Emergency Room on July 06, 1998, with six seizures total that day.  The patient has a history of a craniotomy for a right occipital intracranial hemorrhage.  This patient generally has done well with her seizures, and not had any seizures over the last year.  The patient, however, was noted to go into status epilepticus today around 8:15 a.m., with left arm jerking, head turning to the left, eyes deviated the left.  Both legs were jerking.  The  patient had some tongue biting.  The patient remained in status unresponsive, until she was brought to the emergency room.  The seizure finally broke with IV Ativan. The seizures lasted for about 45 minutes.  The patient is somnolent at this point, but can respond some verbally.  Neurology was asked to see the patient for an evaluation.  A CT scan of the brain shows poor encephalic deficit in the right posterior cerebral artery distribution.  No acute changes seen.  PAST MEDICAL HISTORY: 1. History of prior intracranial hemorrhage, right brain, status post    craniotomy.  The deficit is in the right occipital area. 2. 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 3. History of ongoing tobacco abuse. 4. History of asthma. 5. History of low B12 levels in the past. 6. History of C-sections x 3. 7. History of phlebitis involving the left leg in the past.  The patient is    on Dilantin, I believe taking 300 mg b.i.d.  CURRENT MEDICATIONS:  The patient  otherwise is on no other medications.  ALLERGIES:  CODEINE.  SOCIAL HISTORY:  Smokes 1-1/2 packs of cigarettes daily.  Does drink on occasion. The patient lives in the [place] area.  Is divorced.  She had three children, but one was murdered last summer.  FAMILY HISTORY:  Mother is alive, has cancer and diabetes mellitus.  Father died with heart disease.  The patient has one brother and one sister, both alive and  well.  REVIEW OF SYSTEMS:  Difficult to obtain.  The patient has not been complaining f any fevers or chills. Denies any headache at this time.  PHYSICAL EXAMINATION:  VITAL SIGNS:  Blood pressure 113/44, heart rate 125, respirations 28, temperature 100.9 degrees rectally.  GENERAL:  This patient is a fairly well-developed white female, who is somnolent but can be aroused at the time of examination.  HEENT:  Head atraumatic.  Eyes:  Pupils equal, round, reactive to light.  Discs  soft and flat bilaterally.  NECK:  Supple, no carotid bruits noted.  LUNGS:  Clear to auscultation and percussion.  CARDIOVASCULAR:  A regular rate and rhythm, without obvious murmurs or rubs.  EXTREMITIES:  Without significant edema.  ABDOMEN:  Positive bowel sounds, no organomegaly or tenderness noted.  NEUROLOGIC:  The patient has some right gaze preference  at this point.  Does not blink on threat from either side.  Can state her name.  States that she is in [facility] at this point, but remains fairly somnolent.  The patient does not move the left side well.  Moves the right side normally.  Babinski noted on the left, not present on the right.  A minor tongue laceration is noted. The patient does respond to pain in all fours.  Is able to hold the right arm up in the air.  Cannot hold the left arm in the air.  Is not awake enough for cerebellar testing.  Could not be ambulated.  LABORATORY DATA:  Sodium 146, potassium 3.3, chloride 102, CO2 of 20,  glucose 136, BUN 9, creatinine 0.9, calcium 7.8.  White count 5.2, hemoglobin 13.2, hematocrit 29.2, MCV 105.9, platelets 168.  Chest x-ray and electrocardiogram are pending at this time.  IMPRESSION: 1. History of craniotomies, status post right occipital intracranial hemorrhage. 2. History of seizures, status epilepticus at this time.  PLAN:  This patient is being admitted for observation at this point.  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  This should resolve over time.  A CT scan of the head shows no acute changes at this time.  The patient has been on Dilantin 600 mg q.d., and has a level of around 18/19.  Consider the addition of _____ at this time.  The  patient responded well to the relatively low dose of Ativan.  PLAN: 1. Admission to [facility]. 2. Seizure precautions. 3. IV or p.o. Dilantin. 4. Initiate _____ once the patient is able to take p.o. 5. Will follow her clinical course while in the house.  The hemiparesis    should resolve. DD:  07/09/99 TD:  07/09/99 Job: [id number]

## 2010-12-20 NOTE — Discharge Summary (Signed)
[name], [name]                          ACCOUNT NO.:  [id number]   MEDICAL RECORD NO.:  [id number]                   PATIENT TYPE:  INP   LOCATION:  5511                                 FACILITY:  [facility]   PHYSICIAN:  Deanna Artis. Sharene Skeans, M.D.           DATE OF BIRTH:  03-02-1956   DATE OF ADMISSION:  04/10/2004  DATE OF DISCHARGE:  04/12/2004                                 DISCHARGE SUMMARY   FINAL DIAGNOSES:  1.  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  2.  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  3.  Chronic daily headaches.  4.  Chronic obstructive pulmonary disease.  5.  Status post right occipital region arteriovenous malformation with      hemorrhage.  6.  Anxiety/depression.   PROCEDURE:  CT scan of the brain.   COMPLICATIONS:  None.   SUMMARY OF THE HOSPITALIZATION:  Patient was admitted after a prolonged left  focal motor seizure that secondarily generalized.  She was treated with 2 mg  of Ativan with cessation of her seizures, but rather deep postictal state.   As the patient was assessed, later on she complained bitterly of headache  and myalgias.  CT scan of the brain showed old right occipital cystic change  in the location of her surgery, no other specific changes.  There was  obviously a craniotomy defect.  There was some enlargement of the occipital  horn in that region as a compensatory finding.  There were no acute  findings.   Chest x-ray showed the peribronchial thickening.  There was no sign of bony  fracture.  This was confirmed on second chest x-ray.  Laboratory studies  were as follows:  Dilantin level 24.7.  Creatinine kinase 126.  Comprehensive metabolic panel:  Sodium 137, potassium 3.6, chloride 103, CO2  22, glucose 104, BUN 6, creatinine 0.9, total bilirubin 0.5, alkaline  phosphatase 68, SGOT 29, SGPT 19, total protein 6.4, albumin 3.9, calcium  8.8.  CBC with differential:   White count 5000, hemoglobin 14.9, hematocrit  44.2, MCV 104.2 (smoker), platelet count 191,000, 65 neutrophils, 28  lymphocytes, 6 monocytes, 2 eosinophils, absolute neutrophil count 3200.   The patient this morning is awake and alert.  She has no complaints of  headaches or myalgias.  She has eaten and taken and tolerated her food.  She  is dressed and ready to go.   PHYSICAL EXAMINATION:  VITAL SIGNS:  Temperature 97.4, blood pressure 98/57,  resting pulse 63, respirations 20.  GENERAL:  Normal.  NEUROLOGIC:  Patient has a dense right homonomous hemianopsia.  She has a  left afferent pupillary defect.  She has symmetric facial strength, normal  axial strength, intact sensation, normal gait, symmetric reflexes.   Patient is discharged in improved condition.  We had discussed seizure  surgery.  The patient has right mesial temporal sclerosis which I suspect  strongly is the source of her intractable seizures.  She has here before  been unwilling to consider surgery.  She has been on virtually any  antiepileptic medication marketed and has either not  tolerated them or they have been ineffective.  She realizes that her  seizures are going to worsen with time and is willing to consider surgery.  I have asked her to contact me when she is ready for me to refer her to a  tertiary care center.                                                Deanna Artis. Sharene Skeans, M.D.    [facility]  D:  04/12/2004  T:  04/13/2004  Job:  [id number]   cc:   Deanna Artis. Sharene Skeans, M.D.  [address]  [address]  [place]  [place] [address]  Fax: [phone]

## 2010-12-20 NOTE — Consult Note (Signed)
[place]  Patient:    [name], [name]                       MRN: [id number] Proc. Date: 05/09/00 Adm. Date:  [id number] Disc. Date: [id number] Attending:  Donnetta Hutching                          Consultation Report  EMERGENCY ROOM VISIT:  DATE OF BIRTH:  Oct 14, 1955.  HISTORY OF PRESENT ILLNESS:  Ms. 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 Her last seizure was three months ago.  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  The patient has refused to see an epileptologist to consider surgery for this.  She has been on a variety of antiepileptic medications, most of which she has not tolerated for one reason or another.  She had tolerated Dilantin and has had fairly good seizure control.  Stress seems to increase the frequency and severity of her seizures.  She is under stress at this time because of concerns over legal action to take one of her children from her by her divorced husband.  Apparently the patient was aware the seizure was about to take place, had a 45-second generalized tonic clonic seizure.  EMS was called and brought the patient to the hospital in a postictal state.  She states that she feels warm. Temperature is 98.9.  PHYSICAL EXAMINATION:  GENERAL:  Her general physical examination shows no source of her infection.  NEUROLOGIC:  Neurologic examination shows a groggy, postictal woman, who has a nonfocal examination.  LABORATORY DATA:  Sodium 143, potassium 4.0, chloride 106, glucose 58, BUN 6. A pH of 7.41, PCO2 44.1, bicarbonate 28.0.  Hemoglobin 13, hematocrit 39.0. Urinalysis:  Specific gravity 1.009, pH 5.0.  Chemistries negative.  Drug toxicology screen negative.  Dilantin level 25.9.  IMPRESSION:  The decision is made to continue the patient on Dilantin at a dose of  200 mg in the morning and 300 mg at night time.  The patient has taken and tolerated this dose for quite some time, and her level has fluctuated.  We will see her in follow-up in the office with Demetrio Lapping, physician assistant, and myself in early November. DD:  05/09/00 TD:  05/09/00 Job: [id number]

## 2010-12-20 NOTE — Discharge Summary (Signed)
NAMEROBERTINE, [name]                          ACCOUNT NO.:  [id number]   MEDICAL RECORD NO.:  [id number]                   PATIENT TYPE:  INP   LOCATION:  0343                                 FACILITY:  [facility]   PHYSICIAN:  Deanna Artis. Sharene Skeans, M.D.           DATE OF BIRTH:  [birth year]   DATE OF ADMISSION:  01/04/2004  DATE OF DISCHARGE:  01/05/2004                                 DISCHARGE SUMMARY   FINAL DIAGNOSES:  1. Simple and complex partial seizures with secondary generalization--     intractable (345.51, 345.41, 345.11).  2. Chronic pain in her left arm, back and head as a result of prior injuries     in his postictal migraine.  3. Tobacco abuse.   SUMMARY OF THE HOSPITAL COURSE:  The patient was admitted after a series of  three seizures, two simple partial seizures followed by a five minute  generalized tonic, clonic seizure that was stopped with 6 mg of Ativan. The  patient was stuporous and could not go home. She was admitted for  observation. About seven hours later the patient was beginning to come out  of her stupor and was able to answer questions. She had evidence of left  pupillary defect, left homonymous hemianopsia, altered sensation in the left  arm subjectively, but otherwise a nonfocal exam.   Today, her examination is the same. Vital signs reveal a temperature 98.8,  blood pressure 94/56, resting pulse 72, respirations 18.   The patient takes Dilantin 200 mg in the morning and 300 mg at nighttime.  Will send her home on that medication. She has tried virtually every other  medication that has marketed for seizures. She has either not been able to  tolerate them or they have not been effective.   The patient will return to see me for evaluation in the office in one to two  month's time. I have asked her to contact my office, [phone].  No changes  will be made in her medication.                                               Deanna Artis. Sharene Skeans,  M.D.    [facility]  D:  01/05/2004  T:  01/06/2004  Job:  [id number]

## 2010-12-20 NOTE — H&P (Signed)
NAME:  [name], [name]                ACCOUNT NO.:  [id number]   MEDICAL RECORD NO.:  [id number]          PATIENT TYPE:  INP   LOCATION:  4702                         FACILITY:  [facility]   PHYSICIAN:  Kela Millin, M.D.DATE OF BIRTH:  Nov 04, 1955   DATE OF ADMISSION:  10/02/2005  DATE OF DISCHARGE:                                HISTORY & PHYSICAL   PRIMARY CARE PHYSICIAN:  Dr. Arvilla Market (she has not seen him in 2-3 years).   CHIEF COMPLAINT:  Dizziness, unsteady balance and left-sided numbness.   HISTORY OF PRESENT ILLNESS:  The patient is a 55 year old white female with  past medical history significant for a seizure disorder, on Dilantin, COPD,  chronic daily headaches, anxiety and depression, who presents with above  complaints.  The patient states that she was in her usual state of health  until 2-3 days ago, when she became ataxic and falling all over the place,  and also felt dizzy.  On the day of presentation, she began having headaches  as well as numbness on her left side and so she came to the ER.  She denies  any rash, also no nausea or vomiting, constipation, and has not had any  seizures.  She admits to a nonproductive cough, denies fever and no chest  pain.  The patient also denies diarrhea, hematochezia and no hematemesis.   The patient was seen in the ER and her Dilantin level was found to be  elevated at 40.7.  A CT scan of her brain showed no acute intracranial  changes.  She is admitted to the [facility] for further  evaluation and management.  The patient states that her neurologist is Dr.  Sharene Skeans.   PAST MEDICAL HISTORY:  1.  As stated above.  2.  History of intractable simple and complex partial seizures with      secondary generalization.  3.  Status post right occipital region AVM with hemorrhage.   MEDICATIONS:  1.  Dilantin 200 mg q.a.m. and 300 mg nightly.  2.  Lorazepam p.r.n.  3.  Albuterol MDI.   ALLERGIES/INTOLERANCES:  CODEINE  causes nausea.   SOCIAL HISTORY:  Positive for tobacco, 1-1/2 packs  per day.  Denies  alcohol, also denies illicit drug use.   FAMILY HISTORY:  Her mother had colon and cervical cancer, also diabetes.  Her dad died at age 49 after an MI.   REVIEW OF SYSTEMS:  As per HPI, other review of systems negative.   PHYSICAL EXAMINATION:  GENERAL:  The patient is a middle-aged white female  with coarse faces, in no apparent distress.  VITAL SIGNS:  Her temperature is 98.3.  Her blood pressure is 103/54; she is  not orthostatic.  Her pulse is 90.  Respiratory rate is 20 and O2 SAT is  95%.  HEENT:  PERRL, EOMI.  Horizontal nystagmus is present.  Gingival hyperplasia  is also noted and no oral exudates.  NECK:  Supple, no adenopathy, no carotid bruits appreciated.  LUNGS:  Expiratory crackles present bilaterally, no wheezes appreciated.  CARDIOVASCULAR:  Regular rate and rhythm, normal  S1 and S2.  ABDOMEN:  Soft, bowel sounds present, nontender, non-distended and no  organomegaly.  EXTREMITIES:  No cyanosis or edema.  NEUROLOGIC:  Alert and oriented x3.  Cranial nerves II-XII grossly intact.  Strength is 5/5 and symmetric.   LABORATORY DATA:  Her Dilantin level is 40.7.  INR is 0.9.  Her white cell  count is 4.7, hemoglobin of 14.2, hematocrit of 41.1%, platelet count of  157,000, neutrophil count of 48% and her sodium is 139, potassium 3.8,  chloride of 108.  BUN is 7, glucose of 88.  The pH is 7.39, PCO2 of 44,  bicarb of 26.9 and her creatinine is 0.8.   ASSESSMENT AND PLAN:  1.  Dilantin toxicity -- we will hold Dilantin, recheck level in the      morning, monitor on telemetry bed, follow and adjust Dilantin dosing as      appropriate.  2.  Seizure disorder -- seizure precautions, Ativan as needed.  3.  Chronic obstructive pulmonary disease -- nebulized bronchodilators.  4.  History of anxiety/depression.  5.  History of chronic headaches.  6.  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      Kela Millin, M.D.  Electronically Signed     ACV/MEDQ  D:  10/03/2005  T:  10/03/2005  Job:  [id number]   cc:   Donia Guiles, M.D.  Fax: [phone]   Deanna Artis. Sharene Skeans, M.D.  Fax: [phone]

## 2010-12-20 NOTE — Consult Note (Signed)
[place]  Patient:    [name], [name]                       MRN: [id number] Proc. Date: 07/27/00 Adm. Date:  [id number] Disc. Date: [id number] Attending:  Benny Lennert                          Consultation Report  DATE OF BIRTH: [birth year]  REASON FOR CONSULTATION: This 55 year old right-handed white female is seen at the request of the emergency room physician and physician assistant for evaluation of recurrent seizures.  HISTORY OF PRESENT ILLNESS: [name] has a history of seizures beginning during her third pregnancy.  During that pregnancy she developed a right occipital hemorrhage for reasons not clear to me, and no full old chart is available.  She was seen at [facility] with loss of vision and headache, and was seen by Dr. Sharene Skeans and Dr. Hilda Lias.  She underwent a right parieto-occipital craniotomy.  According to the family she had blood clot removed.  I am not clear whether this was secondary to to an AVM or an aneurysm.  Subsequently she has had secondarily generalized seizures characterized by warning that she is going to have a seizure and then generalized major motor seizures.  These seizures tend to occur near Christmas time or the anniversary of her daughters murder, or when she is under a great deal of stress.  She states she had one one month ago but has not had any in years.  Today she was under a great deal of stress and was upset related to memories regarding her daughter, and called her mother indicating that she was going to have a seizure.  She took extra Dilantin and apparently had three generalized major motor seizures.  There was, however, no urinary or bowel incontinence.  The patient has noted some urinary control problems over the last week without dysuria and without fever.  In the emergency room she was noted to be quite alert and to have a left homonymous hemianopsia visual  field cut.  MEDICATIONS:  1. Dilantin 100 mg 2 in the morning and 3 at night.  2. Ativan 1.0 mg 1 b.i.d., but she has not taken any in several months and     has not taken any this day.  SOCIAL HISTORY: She has smoked one packs of cigarettes per day for 25 years. She drinks less than one beer per month.  She has had seizures for twelve years, secondary, generalized.  She is divorced from her husband.  PHYSICAL EXAMINATION:  GENERAL: Well-developed, pleasant white female, who is upset and anxiety.  VITAL SIGNS: Blood pressure in the right and left arm with a large adult cuff was 85/60.  Heart rate was 92.  There were no bruits.  Temperature was 99.1 degrees with repeat of 99.5 degrees.  NEUROLOGIC: She is alert and oriented x 3 and follows three-step commands. Cranial nerve examination revealed a left homonymous hemianopsia, left seventh.  Tongue midline.  Uvula midline.  Gags present.  Sternocleidomastoid and trapezius testing okay.  Motor examination reveals 5/5 strength, intact pinprick, touch, position.  Deep tendon reflexes were 2+ and plantar responses were downgoing.  CHEST: She had inspiratory wheezing and rhonchi noted.  LABORATORY DATA: Urinalysis without any definite evidence of pyuria.  Urine pregnancy test was negative.  CT scan showed evidence of old encephalomalacia in the  right occipital region.  Glucose was 99.  BUN 6.  Sodium 138, potassium 3.6, chloride 105, CO2 content 24.  Hematocrit 37, hemoglobin 13.  ABG showed a pH of 7.403, bicarbonate 23. Creatinine was 1.0.  Total CPK was 90 or less.  Urinalysis, as mentioned, was unremarkable.  Dilantin level was 24.9.  IMPRESSION:  1. Secondary generalized major motor seizures.  2. Low-grade temperature.  RECOMMENDATIONS: The plan at this time is to obtain a chest x-ray.  I asked the patient not to drive a car and return to see Dr. Sharene Skeans for follow-up evaluation.  I suspect that stress was the underlying  cause for these recent seizures. DD:  07/27/00 TD:  07/28/00 Job: [id number]
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
## 2010-12-20 NOTE — H&P (Signed)
[name], [name]                          ACCOUNT NO.:  [id number]   MEDICAL RECORD NO.:  [id number]                   PATIENT TYPE:  EMS   LOCATION:  ED                                   FACILITY:  [facility]   PHYSICIAN:  Marolyn Hammock. Thad Ranger, M.D.           DATE OF BIRTH:  [birth year]   DATE OF ADMISSION:  04/10/2004  DATE OF DISCHARGE:                                HISTORY & PHYSICAL   CHIEF COMPLAINT:  Seizure.   HISTORY OF PRESENT ILLNESS:  This is one of multiple [name]  admissions for the 55 year old woman patient of Dr. 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  The patient was doing fairly well until today when  she developed focal seizure activity at about 2:30 this afternoon.  EMS was  alerted and brought her to the [facility] Emergency Room.  On arrival, the  patient had focal seizure activity involving the left upper and lower  extremities but was awake and talking.  However, she subsequently developed  secondary generalization and was given 2 mg Ativan IV.  She has had no  further seizures but is quite lethargic and postictal.  Her mother reports  that this is typical for her after she has a flurry of seizures, but she  normally improves after several hours.  She is admitted for brief  observation due to her prolonged postictal state.   PAST MEDICAL HISTORY:  1.  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  2.  She has subsequently developed epilepsy which has been difficult to      control.  She has been intolerant to multiple medications and has not      wished to undergo evaluation, for epilepsy surgery or vagal nerve      stimulator implantation.  3.  Asthma.  4.  Headaches.  5.  Elevated blood pressure for which she is on no medications.   FAMILY HISTORY:  Noncontributory.   SOCIAL  HISTORY:  She is divorced.  She has been under a lot of stress lately  due to the fact that her ex-husband and father of her children has been  quite ill in the hospital.  She does continue to smoke.   ALLERGIES:  CODEINE.   MEDICATIONS:  1.  Dilantin 200 mg q.a.m., 300 mg q.h.s.  2.  Ativan p.r.n.  3.  She also recently finished a prednisone taper and Z-Pak.  4.  She uses an albuterol inhaler as needed.   REVIEW OF SYSTEMS:  She had a recent bout of bronchitis with an asthma  exacerbation for which she was treated with Zithromax and steroid taper. She  has a little bit of a fever due to this.  Otherwise, ten-system Review of  Systems is negative except  as outlined in ER records, although the  information is limited due to patient's stuporous state.   PHYSICAL EXAMINATION:  VITAL SIGNS:  Temperature 98, blood pressure 141/66,  pulse 98, respirations 24.  GENERAL:  She is stuporous, in no evident distress.  She will arouse briefly  and occasionally answer questions with persistence but will not follow  commands.  HEAD:  Cranium normocephalic and atraumatic.  Oropharynx benign.  NECK:  Supple without carotid bruits.  HEART:  Regular rate and rhythm without murmur.  CHEST:  Clear to auscultation.  ABDOMEN:  Soft with normoactive bowel sounds.  EXTREMITIES:  No edema.  NEUROLOGIC:  Mental status:  She is difficult to arouse, will arouse briefly  and answer questions.  Cranial nerves:  Pupils equal and reactive.  She does  move her eyes about spontaneously fairly well.  There is no facial asymmetry  noted. Gag reflexes present.  Motor:  Normal bulk.  She does move all  extremities spontaneously and withdraws to pain.  Sensation intact to  painful stimuli in all extremities.  Reflexes are generally diminished  throughout.  Toes are downgoing.   LABORATORY REVIEW:  CBC:  White count 5.0, hemoglobin 14.9, platelets  191,000.  CMET is normal except for a mildly elevated glucose of 102.   Dilantin level 24.7.   CT of the head is personally reviewed and demonstrates a large area of  encephalomalacia in the right parietooccipital area but no acute findings.   IMPRESSION:  Seizure with prolonged postictal state.   PLAN:  Will admit for observation.  She most likely can go home in the  morning.                                                Michael L. Thad Ranger, M.D.    MLR/MEDQ  D:  04/10/2004  T:  04/10/2004  Job:  [id number]

## 2010-12-20 NOTE — Procedures (Signed)
[place]  Patient:    [name], [name] Visit Number: [id number] MRN: [id number]          Service Type: Attending:  Verlin Grills, M.D. Dictated by:   Verlin Grills, M.D. Proc. Date: 04/22/01   CC:         Moshe Salisbury. Audria Nine, M.D.   Procedure Report  DATE OF BIRTH:  1956/06/17  REFERRING PHYSICIAN:  Moshe Salisbury. Audria Nine, M.D.  PROCEDURE PERFORMED:  Esophagogastroduodenoscopy  ENDOSCOPIST:  Verlin Grills, M.D.  INDICATIONS FOR PROCEDURE:  The patient is a 55 year old female referred by Dr. Dow Adolph from [facility] for a diagnostic esophagogastroduodenoscopy to evaluate epigastric burning and bloating discomfort.  Her February 19, 2001 barium esophagogram was normal.  On March 16, 2001 I placed [name] on Nexium 40 mg each morning.  She is scheduled for esophagogastroduodenoscopy this morning.  She tells me her epigastric discomfort has diminished since starting the proton pump inhibitors.  ENDOSCOPIST:  Verlin Grills, M.D.  PREMEDICATION:  Versed 7.5 mg, fentanyl 50 mcg.  INSTRUMENT USED:  Olympus video gastroscope.  DESCRIPTION OF PROCEDURE:  After obtaining informed consent, [name] was placed in left lateral decubitus position.  I administered intravenous fentanyl and intravenous Versed to achieve conscious sedation for the procedure.  The patients blood pressure, oxygen saturation, cardiac rhythm were monitored throughout the procedure and documented in the medical record.  The Olympus gastroscope was passed through the posterior hypopharynx into the proximal esophagus without difficulty.  The hypopharynx, larynx and vocal cords appeared normal.  Esophagoscopy:  The proximal, mid and lower segments of the esophagus appeared normal.  Endoscopically, there is no evidence for the presence of Barretts esophagus, esophageal mucosal scarring, erosive esophagitis,  esophageal ulceration.  Gastroscopy:  Endoscopically, there is no evidence for a hiatal hernia being present.  Retroflex view of the gastric cardia and fundus was normal.  The gastric body, antrum and pylorus appeared normal.  Duodenoscopy:  The duodenal bulb, midduodenum, and distal duodenum appeared normal.  ASSESSMENT:  Normal esophagogastroduodenoscopy. Dictated by:   Verlin Grills, M.D. Attending:  Verlin Grills, M.D. DD:  04/22/01 TD:  04/22/01 Job: [id number]

## 2010-12-21 ENCOUNTER — Emergency Department (HOSPITAL_COMMUNITY)
Admission: EM | Admit: 2010-12-21 | Discharge: 2010-12-21 | Disposition: A | Discharge status: Home / Self Care | Payer: Medicare Other | Attending: Emergency Medicine | Admitting: Emergency Medicine

## 2010-12-21 DIAGNOSIS — Z79899 Other long term (current) drug therapy: Secondary | ICD-10-CM | POA: Insufficient documentation

## 2010-12-21 DIAGNOSIS — G40909 Epilepsy, unspecified, not intractable, without status epilepticus: Secondary | ICD-10-CM | POA: Insufficient documentation

## 2011-01-30 IMAGING — CR DG LUMBAR SPINE COMPLETE 4+V
5 series · 5 of 5 positions shown · non-contrast
Comparison: 07/15/2005

CLINICAL DATA: Low back pain radiating to right

LUMBAR SPINE - COMPLETE 4+ VIEW

[t l-spine a.p.]
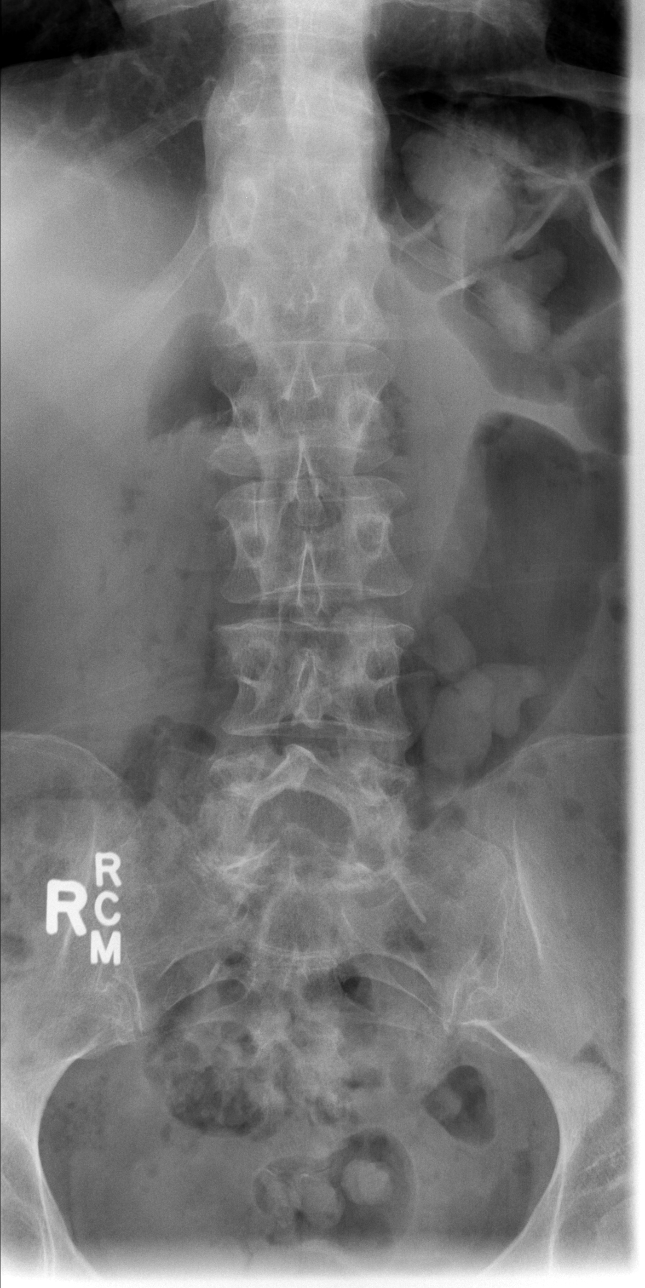

[t l-spine oblique exposure (1 of 2)]
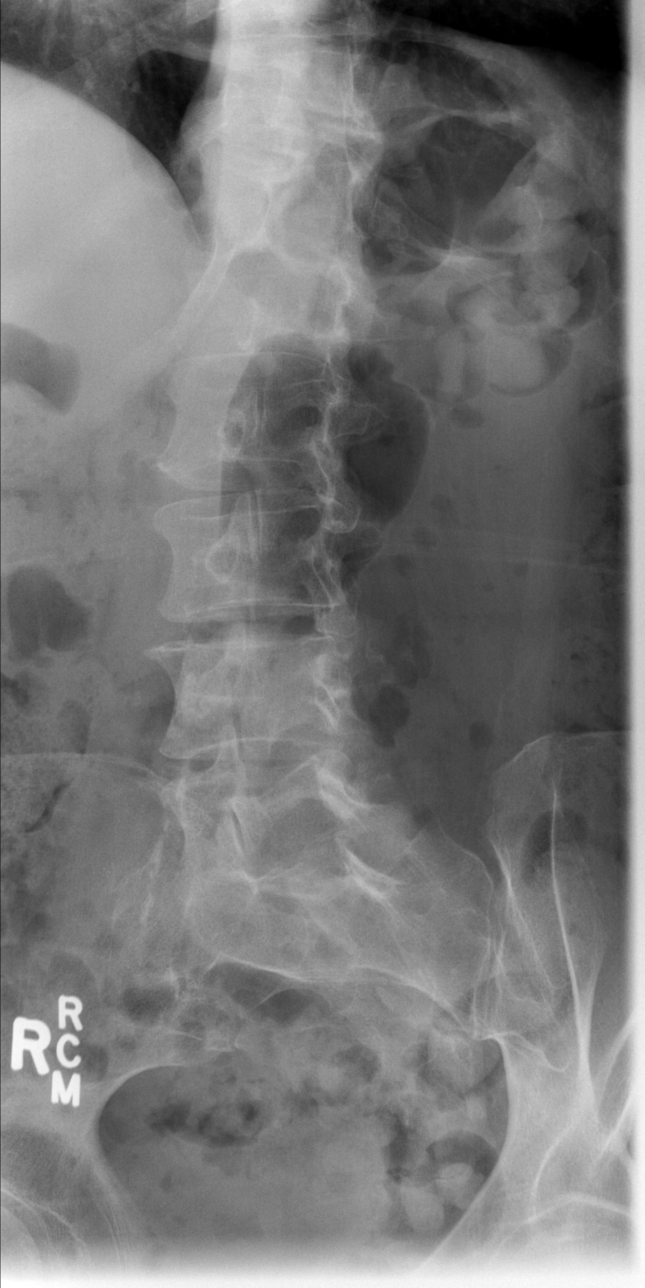

[t l-spine oblique exposure (2 of 2)]
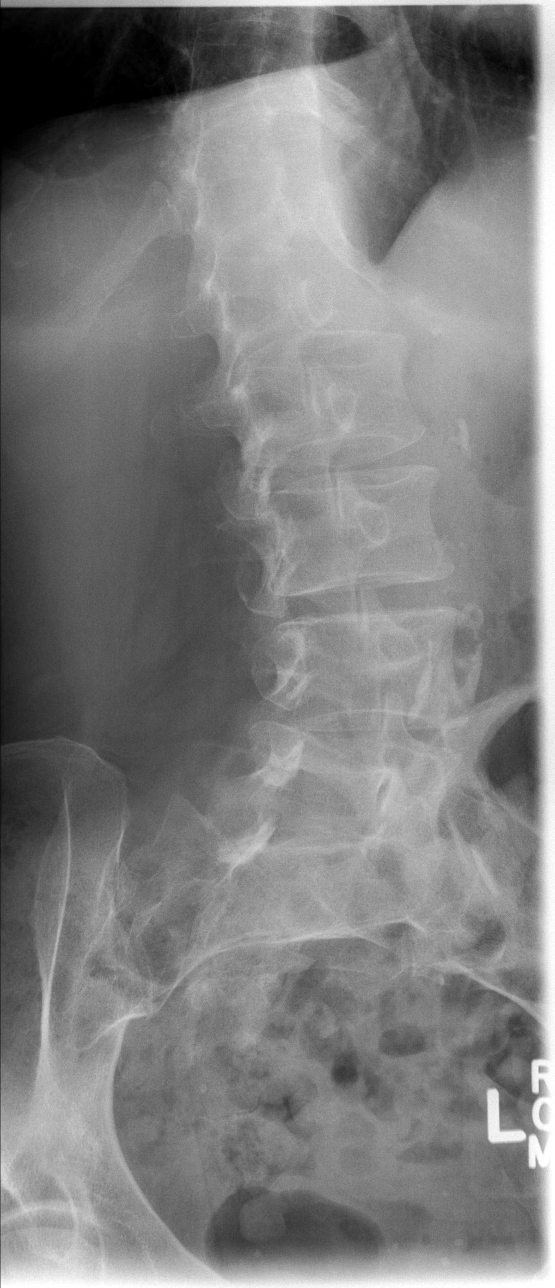

[t l-spine lat]
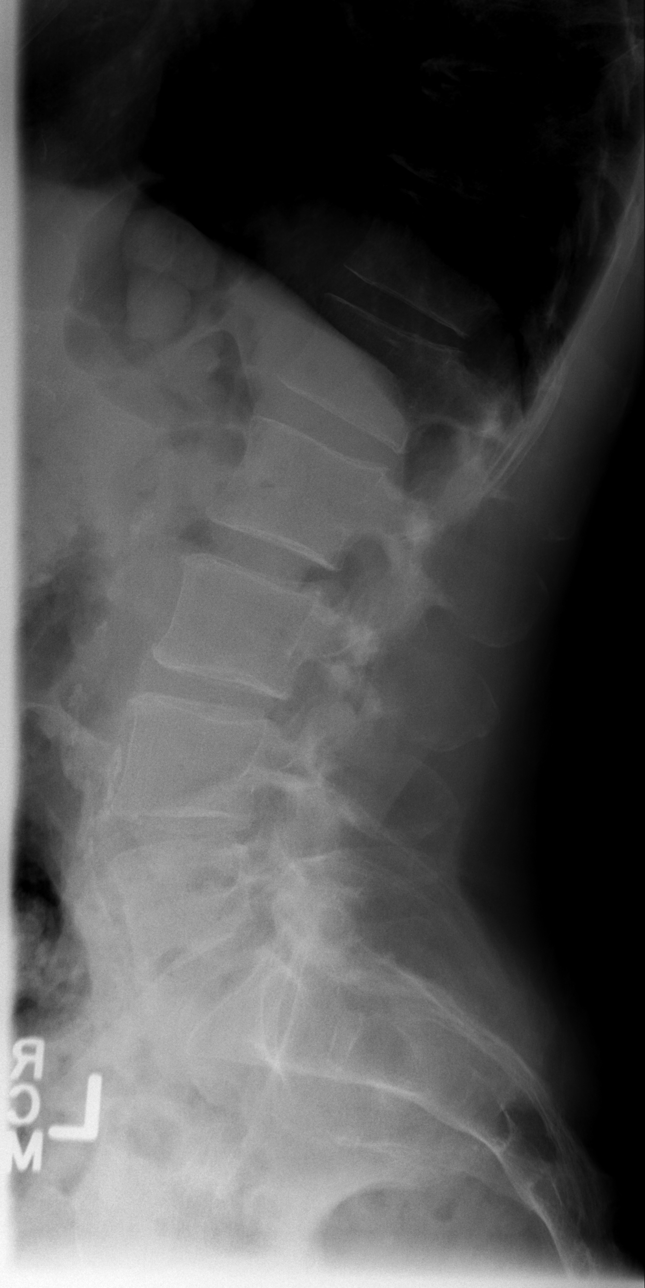

[t l-spine l5-s1 spot]
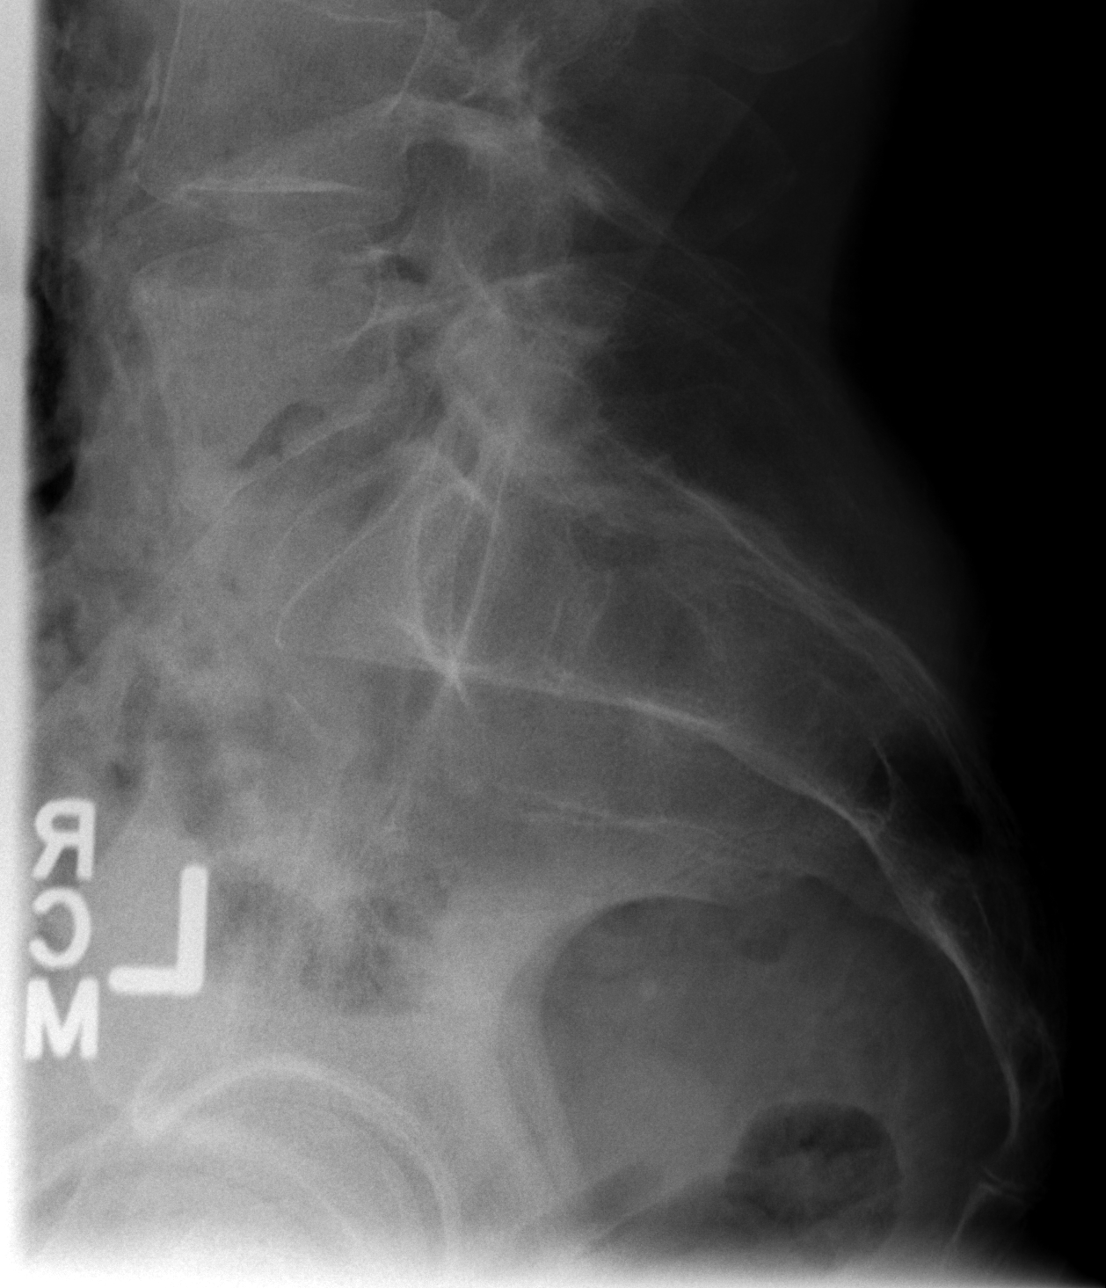

[5 of 5 positions shown; findings below may reference images not displayed]

FINDINGS: Diffuse bony demineralization.
Five lumbar vertebrae.
Minimal broad-based levoconvex scoliosis.
Minimal disc space narrowing of L4-L5.
Vertebral body heights maintained without fracture or subluxation.
No spondylolysis.
Scattered atherosclerotic calcifications.
SI joints symmetric.
IMPRESSION: No acute abnormalities.

## 2011-03-06 ENCOUNTER — Emergency Department (HOSPITAL_COMMUNITY)
Admission: EM | Admit: 2011-03-06 | Discharge: 2011-03-06 | Disposition: A | Discharge status: Home / Self Care | Payer: Medicare Other | Attending: Emergency Medicine | Admitting: Emergency Medicine

## 2011-03-06 DIAGNOSIS — Z79899 Other long term (current) drug therapy: Secondary | ICD-10-CM | POA: Insufficient documentation

## 2011-03-06 DIAGNOSIS — H547 Unspecified visual loss: Secondary | ICD-10-CM | POA: Insufficient documentation

## 2011-03-06 DIAGNOSIS — R197 Diarrhea, unspecified: Secondary | ICD-10-CM | POA: Insufficient documentation

## 2011-03-06 DIAGNOSIS — F29 Unspecified psychosis not due to a substance or known physiological condition: Secondary | ICD-10-CM | POA: Insufficient documentation

## 2011-03-06 DIAGNOSIS — G40909 Epilepsy, unspecified, not intractable, without status epilepticus: Secondary | ICD-10-CM | POA: Insufficient documentation

## 2011-03-06 LAB — URINALYSIS, ROUTINE W REFLEX MICROSCOPIC
Leukocytes, UA: NEGATIVE
Nitrite: NEGATIVE
Specific Gravity, Urine: 1.011 (ref 1.005–1.030)
pH: 7.5 (ref 5.0–8.0)

## 2011-03-06 LAB — GLUCOSE, CAPILLARY: Glucose-Capillary: 90 mg/dL (ref 70–99)

## 2011-03-06 LAB — POCT I-STAT, CHEM 8
BUN: 7 mg/dL (ref 6–23)
Chloride: 102 mEq/L (ref 96–112)
Sodium: 140 mEq/L (ref 135–145)

## 2011-03-06 LAB — PHENYTOIN LEVEL, TOTAL: Phenytoin Lvl: 29.1 ug/mL — ABNORMAL HIGH (ref 10.0–20.0)
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
## 2011-05-06 LAB — PHENYTOIN LEVEL, TOTAL: Phenytoin Lvl: 19.6

## 2011-05-14 LAB — BASIC METABOLIC PANEL
BUN: 4 — ABNORMAL LOW
BUN: 6
Calcium: 9.2
Chloride: 109
Creatinine, Ser: 0.62
Creatinine, Ser: 0.71
GFR calc Af Amer: >60
GFR calc non Af Amer: >60

## 2011-05-14 LAB — DIFFERENTIAL
Basophils Relative: 1
Eosinophils Absolute: 0
Eosinophils Absolute: 0
Lymphocytes Relative: 26
Lymphs Abs: 1.3
Lymphs Abs: 1.2
Monocytes Relative: 4
Neutro Abs: 3.1
Neutro Abs: 3.4
Neutrophils Relative %: 67
Neutrophils Relative %: 71

## 2011-05-14 LAB — POCT CARDIAC MARKERS
Myoglobin, poc: 64.5
Operator id: 285841

## 2011-05-14 LAB — COMPREHENSIVE METABOLIC PANEL
BUN: 7
CO2: 25
Calcium: 9.2
Creatinine, Ser: 0.81
GFR calc non Af Amer: >60
Glucose, Bld: 123 — ABNORMAL HIGH
Total Protein: 6.8

## 2011-05-14 LAB — CBC
Hemoglobin: 14.7
MCHC: 33.4
MCHC: 34
MCV: 104.5 — ABNORMAL HIGH
MCV: 103.3 — ABNORMAL HIGH
Platelets: 139 — ABNORMAL LOW
Platelets: 134 — ABNORMAL LOW
RBC: 4.21
RBC: 4.17
RDW: 13
WBC: 4.7
WBC: 5.5

## 2011-05-14 LAB — CULTURE, BLOOD (ROUTINE X 2)
Culture: NO GROWTH
Culture: NO GROWTH

## 2011-05-14 LAB — URINALYSIS, ROUTINE W REFLEX MICROSCOPIC
Glucose, UA: NEGATIVE
Leukocytes, UA: NEGATIVE
Protein, ur: 100 — AB
Specific Gravity, Urine: 1.019

## 2011-05-14 LAB — RAPID URINE DRUG SCREEN, HOSP PERFORMED
Amphetamines: NOT DETECTED
Barbiturates: NOT DETECTED
Tetrahydrocannabinol: NOT DETECTED

## 2011-05-14 LAB — URINE CULTURE: Colony Count: NO GROWTH

## 2011-05-14 LAB — INFLUENZA A+B VIRUS AG-DIRECT(RAPID)
Inflenza A Ag: NEGATIVE
Influenza B Ag: NEGATIVE

## 2011-05-14 LAB — PHENYTOIN LEVEL, TOTAL: Phenytoin Lvl: 13.9

## 2011-05-14 LAB — URINE MICROSCOPIC-ADD ON

## 2011-05-15 LAB — COMPREHENSIVE METABOLIC PANEL
ALT: 18
AST: 23
Albumin: 3.6
CO2: 30
Calcium: 9
Chloride: 105
GFR calc Af Amer: >60
GFR calc non Af Amer: >60
Sodium: 143

## 2011-05-15 LAB — BASIC METABOLIC PANEL
BUN: 9
CO2: 27
GFR calc non Af Amer: >60
Glucose, Bld: 71
Potassium: 4.3

## 2011-05-15 LAB — I-STAT 8, (EC8 V) (CONVERTED LAB)
Acid-Base Excess: 5 — ABNORMAL HIGH
Hemoglobin: 14.3
Potassium: 3.5
Sodium: 140
TCO2: 30

## 2011-05-15 LAB — RAPID URINE DRUG SCREEN, HOSP PERFORMED
Barbiturates: POSITIVE — AB
Benzodiazepines: POSITIVE — AB

## 2011-05-15 LAB — CBC
HCT: 40.2
Platelets: 153
RDW: 13.5

## 2011-05-15 LAB — DIFFERENTIAL
Basophils Absolute: 0
Eosinophils Relative: 2
Lymphocytes Relative: 42
Neutro Abs: 2.2

## 2011-05-15 LAB — PHENYTOIN LEVEL, TOTAL: Phenytoin Lvl: 16

## 2011-05-15 LAB — AMYLASE: Amylase: 53

## 2011-05-16 LAB — DIFFERENTIAL
Basophils Absolute: 0
Basophils Absolute: 0
Basophils Relative: 0
Basophils Relative: 1
Eosinophils Absolute: 0.1
Eosinophils Relative: 4
Lymphocytes Relative: 45
Monocytes Absolute: 0.3
Monocytes Relative: 5
Neutro Abs: 2.3
Neutrophils Relative %: 60

## 2011-05-16 LAB — COMPREHENSIVE METABOLIC PANEL
ALT: 17
Albumin: 3.9
Alkaline Phosphatase: 52
Alkaline Phosphatase: 53
BUN: 8
CO2: 26
CO2: 28
Chloride: 107
Chloride: 105
Creatinine, Ser: 0.68
GFR calc non Af Amer: >60
Glucose, Bld: 104 — ABNORMAL HIGH
Potassium: 3.9
Potassium: 3.9
Sodium: 138
Total Bilirubin: 1
Total Bilirubin: 0.6
Total Protein: 5.6 — ABNORMAL LOW

## 2011-05-16 LAB — HEPATIC FUNCTION PANEL
ALT: 13
AST: 17
Albumin: 3.2 — ABNORMAL LOW
Alkaline Phosphatase: 57
Bilirubin, Direct: 0.1
Indirect Bilirubin: 0.3
Total Bilirubin: 0.4
Total Protein: 5.3 — ABNORMAL LOW

## 2011-05-16 LAB — CULTURE, BLOOD (ROUTINE X 2)

## 2011-05-16 LAB — URINE CULTURE
Colony Count: NO GROWTH
Culture: NO GROWTH

## 2011-05-16 LAB — CBC
HCT: 41.9
HCT: 37.8
Hemoglobin: 14.3
Hemoglobin: 13
MCV: 103.5 — ABNORMAL HIGH
Platelets: 150
RBC: 4.05
RBC: 3.7 — ABNORMAL LOW
RDW: 13.6
WBC: 5
WBC: 4.8

## 2011-05-16 LAB — POCT CARDIAC MARKERS
CKMB, poc: 1.3
CKMB, poc: 1.7
Myoglobin, poc: 54.1
Troponin i, poc: <0.05
Troponin i, poc: <0.05

## 2011-05-16 LAB — PHENYTOIN LEVEL, TOTAL
Phenytoin Lvl: 36.8
Phenytoin Lvl: 23.7 — ABNORMAL HIGH

## 2011-05-16 LAB — URINALYSIS, ROUTINE W REFLEX MICROSCOPIC
Bilirubin Urine: NEGATIVE
Glucose, UA: NEGATIVE
Glucose, UA: NEGATIVE
Hgb urine dipstick: NEGATIVE
Hgb urine dipstick: NEGATIVE
Ketones, ur: NEGATIVE
Ketones, ur: NEGATIVE
Nitrite: NEGATIVE
Protein, ur: NEGATIVE
Specific Gravity, Urine: 1.006
pH: 6

## 2011-05-16 LAB — PROTIME-INR: INR: 1

## 2011-05-16 LAB — SEDIMENTATION RATE: Sed Rate: 3

## 2011-05-16 LAB — TSH: TSH: 1.411

## 2011-05-16 LAB — FOLATE: Folate: 7

## 2011-05-16 LAB — PREALBUMIN: Prealbumin: 13.9 — ABNORMAL LOW

## 2011-06-09 ENCOUNTER — Encounter (HOSPITAL_BASED_OUTPATIENT_CLINIC_OR_DEPARTMENT_OTHER): Payer: Self-pay | Admitting: *Deleted

## 2011-06-09 ENCOUNTER — Emergency Department (INDEPENDENT_AMBULATORY_CARE_PROVIDER_SITE_OTHER): Payer: Medicare Other

## 2011-06-09 ENCOUNTER — Emergency Department (HOSPITAL_BASED_OUTPATIENT_CLINIC_OR_DEPARTMENT_OTHER)
Admission: EM | Admit: 2011-06-09 | Discharge: 2011-06-09 | Disposition: A | Discharge status: Home / Self Care | Payer: Medicare Other | Attending: Emergency Medicine | Admitting: Emergency Medicine

## 2011-06-09 ENCOUNTER — Encounter: Payer: Self-pay | Admitting: *Deleted

## 2011-06-09 ENCOUNTER — Emergency Department (HOSPITAL_COMMUNITY)
Admission: EM | Admit: 2011-06-09 | Discharge: 2011-06-09 | Disposition: A | Discharge status: Home / Self Care | Payer: Medicare Other | Attending: Emergency Medicine | Admitting: Emergency Medicine

## 2011-06-09 DIAGNOSIS — W19XXXA Unspecified fall, initial encounter: Secondary | ICD-10-CM

## 2011-06-09 DIAGNOSIS — W2203XA Walked into furniture, initial encounter: Secondary | ICD-10-CM | POA: Insufficient documentation

## 2011-06-09 DIAGNOSIS — M79609 Pain in unspecified limb: Secondary | ICD-10-CM | POA: Insufficient documentation

## 2011-06-09 DIAGNOSIS — J45909 Unspecified asthma, uncomplicated: Secondary | ICD-10-CM | POA: Insufficient documentation

## 2011-06-09 DIAGNOSIS — M79676 Pain in unspecified toe(s): Secondary | ICD-10-CM

## 2011-06-09 DIAGNOSIS — M25569 Pain in unspecified knee: Secondary | ICD-10-CM | POA: Insufficient documentation

## 2011-06-09 DIAGNOSIS — Z86718 Personal history of other venous thrombosis and embolism: Secondary | ICD-10-CM | POA: Insufficient documentation

## 2011-06-09 DIAGNOSIS — F17200 Nicotine dependence, unspecified, uncomplicated: Secondary | ICD-10-CM | POA: Insufficient documentation

## 2011-06-09 DIAGNOSIS — R569 Unspecified convulsions: Secondary | ICD-10-CM | POA: Insufficient documentation

## 2011-06-09 HISTORY — DX: Acute embolism and thrombosis of unspecified deep veins of unspecified lower extremity: I82.409

## 2011-06-09 HISTORY — DX: Unspecified convulsions: R56.9

## 2011-06-09 MED ORDER — HYDROCODONE-ACETAMINOPHEN 5-325 MG PO TABS
1.0000 | ORAL_TABLET | Freq: Once | ORAL | Status: AC
  Administered 2011-06-09: 1 via ORAL
  Filled 2011-06-09: qty 1

## 2011-06-09 MED ORDER — HYDROCODONE-ACETAMINOPHEN 5-500 MG PO TABS: 1.0000 | ORAL_TABLET | Freq: Four times a day (QID) | ORAL | Status: AC | PRN

## 2011-06-09 NOTE — ED Notes (Signed)
The pt has a seizure disroder and since she has her last seizure this am she has been hurting on her lt knee and both feet .  When she was having her seizure her feet were under the bedrails causing her pain

## 2011-06-09 NOTE — ED Notes (Signed)
Pt has history of seizures and had two seizures this am. During the second seizure, pt fell and is now c/o pain to her left foot, left knee and right 3rd, 4th and 5th toes.

## 2011-06-09 NOTE — ED Provider Notes (Signed)
History     CSN: [id number] Arrival date & time: 06/09/2011  7:41 PM   First MD Initiated Contact with Patient 06/09/11 1947      Chief Complaint  Patient presents with  . Fall    (Consider location/radiation/quality/duration/timing/severity/associated sxs/prior treatment) HPI Comments: Pt states that she had 2 seizures this morning:pt states that she is not here for the seizure as she has them when she gets stressed:pt daughter states that she was hitting her foot on a metal bed rail during her seizure and has been c/o pain since  Patient is a 55 y.o. female presenting with leg pain. The history is provided by the patient and a relative. No language interpreter was used.  Leg Pain  The incident occurred 6 to 12 hours ago. The incident occurred at home. The injury mechanism was a direct blow. The pain is present in the left foot, left knee and right foot. The quality of the pain is described as aching. The pain is moderate. The pain has been constant since onset. Pertinent negatives include no numbness. She reports no foreign bodies present. The symptoms are aggravated by activity. She has tried nothing for the symptoms.    Past Medical History  Diagnosis Date  . Seizures   . DVT (deep venous thrombosis)   . Asthma     Past Surgical History  Procedure Date  . Cesarean section     No family history on file.  History  Substance Use Topics  . Smoking status: Current Everyday Smoker  . Smokeless tobacco: Not on file  . Alcohol Use: No    OB History    Grav Para Term Preterm Abortions TAB SAB Ect Mult Living                  Review of Systems  Neurological: Negative for numbness.  All other systems reviewed and are negative.    Allergies  Codeine and Keppra  Home Medications   Current Outpatient Rx  Name Route Sig Dispense Refill  . PHENYTOIN SODIUM EXTENDED 100 MG PO CAPS Oral Take 200 mg by mouth 2 (two) times daily.        BP 119/60  Pulse 88  Temp(Src)  98.2 F (36.8 C) (Oral)  SpO2 96%  Physical Exam  Nursing note and vitals reviewed. Constitutional: She is oriented to person, place, and time. She appears well-developed and well-nourished.  HENT:  Head: Normocephalic and atraumatic.  Neck: Normal range of motion.  Cardiovascular: Normal rate and regular rhythm.   Pulmonary/Chest: Effort normal and breath sounds normal.  Musculoskeletal:       Pt has generalized tenderness to the left foot and the right foot:pt has full rom:no obvious deformity noted to either feet or knee  Neurological: She is alert and oriented to person, place, and time.  Skin: Skin is warm and dry.    ED Course  Procedures (including critical care time)  Labs Reviewed - No data to display Dg Knee Complete 4 Views Left  06/09/2011  *RADIOLOGY REPORT*  Clinical Data: Fall, knee pain  LEFT KNEE - COMPLETE 4+ VIEW  Comparison: None.  Findings: No fracture or dislocation.  No soft tissue abnormality. No radiopaque foreign body.  Minimal tibial spine spurring noted.  IMPRESSION: No acute abnormality.  Original Report Authenticated By: Harrel Lemon, M.D.   Dg Foot Complete Left  06/09/2011  *RADIOLOGY REPORT*  Clinical Data: Fall, pain over the second through fourth toes  LEFT FOOT - COMPLETE 3+  VIEW  Comparison: None.  Findings: No fracture or dislocation.  No soft tissue abnormality. No radiopaque foreign body.  The bones are osteopenic out of proportion to that expected for the patient's age.  The bases of the third and fourth metatarsals are not optimally evaluated.  IMPRESSION: No acute finding.  Osteopenia out of proportion to that expected for the patient's age.  This could mask insufficiency type fracture, particularly at the base of the third and fourth metatarsal.  If symptoms persist, consider re-imaging in 7-10 days or MRI.  Original Report Authenticated By: Harrel Lemon, M.D.   Dg Foot Complete Right  06/09/2011  *RADIOLOGY REPORT*  Clinical Data:  Fall, right foot pain  RIGHT FOOT COMPLETE - 3+ VIEW  Comparison: None.  Findings: The bones are osteopenic out of proportion to that expected for the patient's age.  This could mask subtle fracture. No fracture or dislocation.  No soft tissue abnormality.  No radiopaque foreign body.  IMPRESSION: No acute abnormality.  Original Report Authenticated By: Harrel Lemon, M.D.     1. Toe pain   2. Knee pain       MDM  No acute findings noted on x-ray:pt not having any acute finding:pt splinted by nursing staff on left foot       Teressa Lower, NP 06/09/11 2013

## 2011-06-09 NOTE — ED Provider Notes (Signed)
Medical screening examination/treatment/procedure(s) were performed by non-physician practitioner and as supervising physician I was immediately available for consultation/collaboration.   Geoffery Lyons, MD 06/09/11 2157

## 2011-06-11 ENCOUNTER — Encounter (HOSPITAL_BASED_OUTPATIENT_CLINIC_OR_DEPARTMENT_OTHER): Payer: Self-pay

## 2011-07-23 ENCOUNTER — Encounter (HOSPITAL_BASED_OUTPATIENT_CLINIC_OR_DEPARTMENT_OTHER): Payer: Self-pay | Admitting: *Deleted

## 2011-07-23 ENCOUNTER — Encounter (HOSPITAL_COMMUNITY): Payer: Self-pay | Admitting: Emergency Medicine

## 2011-07-23 ENCOUNTER — Emergency Department (HOSPITAL_COMMUNITY)
Admission: EM | Admit: 2011-07-23 | Discharge: 2011-07-23 | Disposition: A | Discharge status: Home / Self Care | Payer: Medicare Other | Attending: Emergency Medicine | Admitting: Emergency Medicine

## 2011-07-23 ENCOUNTER — Emergency Department (INDEPENDENT_AMBULATORY_CARE_PROVIDER_SITE_OTHER): Payer: Medicare Other

## 2011-07-23 ENCOUNTER — Emergency Department (HOSPITAL_BASED_OUTPATIENT_CLINIC_OR_DEPARTMENT_OTHER)
Admission: EM | Admit: 2011-07-23 | Discharge: 2011-07-23 | Disposition: A | Discharge status: Home / Self Care | Payer: Medicare Other | Attending: Emergency Medicine | Admitting: Emergency Medicine

## 2011-07-23 DIAGNOSIS — W19XXXA Unspecified fall, initial encounter: Secondary | ICD-10-CM

## 2011-07-23 DIAGNOSIS — Z86718 Personal history of other venous thrombosis and embolism: Secondary | ICD-10-CM | POA: Insufficient documentation

## 2011-07-23 DIAGNOSIS — J45909 Unspecified asthma, uncomplicated: Secondary | ICD-10-CM | POA: Insufficient documentation

## 2011-07-23 DIAGNOSIS — M549 Dorsalgia, unspecified: Secondary | ICD-10-CM | POA: Insufficient documentation

## 2011-07-23 DIAGNOSIS — R52 Pain, unspecified: Secondary | ICD-10-CM

## 2011-07-23 MED ORDER — HYDROCODONE-ACETAMINOPHEN 5-500 MG PO TABS: 1.0000 | ORAL_TABLET | Freq: Four times a day (QID) | ORAL | Status: AC | PRN

## 2011-07-23 NOTE — ED Notes (Signed)
Pt c/o fall x 1 day, c/o lower back pain

## 2011-07-23 NOTE — ED Notes (Signed)
Patient transported to X-ray 

## 2011-07-23 NOTE — ED Notes (Signed)
Pt st's she fell yesterday, c/o pain in lower back radiating into right leg.

## 2011-07-23 NOTE — ED Provider Notes (Signed)
History     CSN: [id number] Arrival date & time: 07/23/2011  7:29 PM   First MD Initiated Contact with Patient 07/23/11 1947      Chief Complaint  Patient presents with  . Back Pain    (Consider location/radiation/quality/duration/timing/severity/associated sxs/prior treatment) Patient is a 55 y.o. female presenting with back pain. The history is provided by the patient. No language interpreter was used.  Back Pain  This is a new problem. The current episode started yesterday. The problem occurs constantly. The problem has not changed since onset.The pain is associated with falling. The pain is present in the lumbar spine. The quality of the pain is described as aching. The pain does not radiate. The pain is moderate. The symptoms are aggravated by bending. The pain is the same all the time. Pertinent negatives include no bowel incontinence, no perianal numbness, no bladder incontinence, no tingling and no weakness. Treatments tried: hydrocodone yesterday. The treatment provided mild relief.    Past Medical History  Diagnosis Date  . Seizures   . DVT (deep venous thrombosis)   . Asthma     Past Surgical History  Procedure Date  . Cesarean section     History reviewed. No pertinent family history.  History  Substance Use Topics  . Smoking status: Current Everyday Smoker  . Smokeless tobacco: Not on file  . Alcohol Use: No    OB History    Grav Para Term Preterm Abortions TAB SAB Ect Mult Living                  Review of Systems  Gastrointestinal: Negative for bowel incontinence.  Genitourinary: Negative for bladder incontinence.  Musculoskeletal: Positive for back pain.  Neurological: Negative for tingling and weakness.  All other systems reviewed and are negative.    Allergies  Codeine and Keppra  Home Medications   Current Outpatient Rx  Name Route Sig Dispense Refill  . HYDROCODONE-ACETAMINOPHEN 5-500 MG PO TABS Oral Take 1 tablet by mouth every 6  (six) hours as needed. For pain     . LACOSAMIDE 100 MG PO TABS Oral Take 100 mg by mouth 2 (two) times daily.      Marland Kitchen PHENYTOIN SODIUM EXTENDED 100 MG PO CAPS Oral Take 200 mg by mouth 3 (three) times daily.     Marland Kitchen NEURO B-12 IJ Injection Inject 1,000 mcg as directed once.      Marland Kitchen VITAMIN D (ERGOCALCIFEROL) 50000 UNITS PO CAPS Oral Take 50,000 Units by mouth every 7 (seven) days.        BP 114/72  Pulse 81  Temp(Src) 98.1 F (36.7 C) (Oral)  Resp 16  Ht 5\' 7"  (1.702 m)  Wt 124 lb (56.246 kg)  BMI 19.42 kg/m2  SpO2 100%  Physical Exam  Vitals reviewed. Constitutional: She is oriented to person, place, and time. She appears well-developed and well-nourished.  Cardiovascular: Normal rate and regular rhythm.   Pulmonary/Chest: Breath sounds normal.  Musculoskeletal:       Cervical back: Normal.       Thoracic back: Normal.       Lumbar back: She exhibits bony tenderness.  Neurological: She is alert and oriented to person, place, and time.  Skin: Skin is warm and dry.  Psychiatric: She has a normal mood and affect.    ED Course  Procedures (including critical care time)  Labs Reviewed - No data to display Dg Lumbar Spine Complete  07/23/2011  *RADIOLOGY REPORT*  Clinical Data: Fall  LUMBAR SPINE - COMPLETE 4+ VIEW  Comparison: 08/25/2010  Findings: Negative for fracture.  Normal alignment.  No significant disc degeneration or pars defect.  IMPRESSION: Negative  Original Report Authenticated By: Camelia Phenes, M.D.     1. Back pain       MDM  No acute finding:pt is neurologicall intact:pt is okay to go home  Medical screening examination/treatment/procedure(s) were performed by non-physician practitioner and as supervising physician I was immediately available for consultation/collaboration. Osvaldo Human, M.D.       Teressa Lower, NP 07/23/11 2113  Carleene Cooper III, MD 07/24/11 [phone]

## 2011-12-31 ENCOUNTER — Other Ambulatory Visit: Payer: Self-pay | Admitting: Diagnostic Neuroimaging

## 2011-12-31 DIAGNOSIS — G40219 Localization-related (focal) (partial) symptomatic epilepsy and epileptic syndromes with complex partial seizures, intractable, without status epilepticus: Secondary | ICD-10-CM

## 2011-12-31 DIAGNOSIS — Z79899 Other long term (current) drug therapy: Secondary | ICD-10-CM

## 2012-01-22 ENCOUNTER — Other Ambulatory Visit: Payer: Medicare Other

## 2012-02-27 ENCOUNTER — Inpatient Hospital Stay (HOSPITAL_COMMUNITY)
Admission: EM | Admit: 2012-02-27 | Discharge: 2012-03-05 | DRG: 086 | Disposition: A | Discharge status: Skilled Nursing Facility | Payer: Medicare Other | Attending: Internal Medicine | Admitting: Internal Medicine

## 2012-02-27 ENCOUNTER — Encounter (HOSPITAL_COMMUNITY): Payer: Self-pay | Admitting: Emergency Medicine

## 2012-02-27 ENCOUNTER — Emergency Department (HOSPITAL_COMMUNITY): Payer: Medicare Other

## 2012-02-27 DIAGNOSIS — R509 Fever, unspecified: Secondary | ICD-10-CM

## 2012-02-27 DIAGNOSIS — T420X5A Adverse effect of hydantoin derivatives, initial encounter: Secondary | ICD-10-CM | POA: Diagnosis present

## 2012-02-27 DIAGNOSIS — T50904A Poisoning by unspecified drugs, medicaments and biological substances, undetermined, initial encounter: Secondary | ICD-10-CM

## 2012-02-27 DIAGNOSIS — F17200 Nicotine dependence, unspecified, uncomplicated: Secondary | ICD-10-CM | POA: Diagnosis present

## 2012-02-27 DIAGNOSIS — S02109A Fracture of base of skull, unspecified side, initial encounter for closed fracture: Principal | ICD-10-CM | POA: Diagnosis present

## 2012-02-27 DIAGNOSIS — I959 Hypotension, unspecified: Secondary | ICD-10-CM

## 2012-02-27 DIAGNOSIS — J40 Bronchitis, not specified as acute or chronic: Secondary | ICD-10-CM | POA: Diagnosis present

## 2012-02-27 DIAGNOSIS — S0280XA Fracture of other specified skull and facial bones, unspecified side, initial encounter for closed fracture: Secondary | ICD-10-CM

## 2012-02-27 DIAGNOSIS — T50901A Poisoning by unspecified drugs, medicaments and biological substances, accidental (unintentional), initial encounter: Secondary | ICD-10-CM

## 2012-02-27 DIAGNOSIS — G40909 Epilepsy, unspecified, not intractable, without status epilepticus: Secondary | ICD-10-CM

## 2012-02-27 DIAGNOSIS — S2239XA Fracture of one rib, unspecified side, initial encounter for closed fracture: Secondary | ICD-10-CM | POA: Diagnosis present

## 2012-02-27 DIAGNOSIS — S0291XA Unspecified fracture of skull, initial encounter for closed fracture: Secondary | ICD-10-CM

## 2012-02-27 DIAGNOSIS — Z86718 Personal history of other venous thrombosis and embolism: Secondary | ICD-10-CM

## 2012-02-27 DIAGNOSIS — R0789 Other chest pain: Secondary | ICD-10-CM

## 2012-02-27 DIAGNOSIS — I629 Nontraumatic intracranial hemorrhage, unspecified: Secondary | ICD-10-CM

## 2012-02-27 DIAGNOSIS — R279 Unspecified lack of coordination: Secondary | ICD-10-CM | POA: Diagnosis present

## 2012-02-27 DIAGNOSIS — W19XXXA Unspecified fall, initial encounter: Secondary | ICD-10-CM | POA: Diagnosis present

## 2012-02-27 DIAGNOSIS — T420X1A Poisoning by hydantoin derivatives, accidental (unintentional), initial encounter: Secondary | ICD-10-CM

## 2012-02-27 DIAGNOSIS — J45909 Unspecified asthma, uncomplicated: Secondary | ICD-10-CM | POA: Diagnosis present

## 2012-02-27 HISTORY — DX: Nontraumatic intracerebral hemorrhage, unspecified: I61.9

## 2012-02-27 LAB — CBC WITH DIFFERENTIAL/PLATELET
Basophils Absolute: 0 10*3/uL (ref 0.0–0.1)
Basophils Relative: 0 % (ref 0–1)
Eosinophils Absolute: 0 10*3/uL (ref 0.0–0.7)
Lymphs Abs: 1.1 10*3/uL (ref 0.7–4.0)
MCH: 34.4 pg — ABNORMAL HIGH (ref 26.0–34.0)
MCHC: 33.9 g/dL (ref 30.0–36.0)
Neutrophils Relative %: 79 % — ABNORMAL HIGH (ref 43–77)
Platelets: 124 10*3/uL — ABNORMAL LOW (ref 150–400)
RBC: 3.78 MIL/uL — ABNORMAL LOW (ref 3.87–5.11)
RDW: 13.2 % (ref 11.5–15.5)

## 2012-02-27 LAB — POCT I-STAT, CHEM 8
HCT: 39 % (ref 36.0–46.0)
Hemoglobin: 13.3 g/dL (ref 12.0–15.0)
Potassium: 3.2 mEq/L — ABNORMAL LOW (ref 3.5–5.1)
Sodium: 141 mEq/L (ref 135–145)

## 2012-02-27 MED ORDER — LORAZEPAM 2 MG/ML IJ SOLN
1.0000 mg | Freq: Once | INTRAMUSCULAR | Status: AC
  Administered 2012-02-27: 1 mg via INTRAVENOUS
  Filled 2012-02-27: qty 1

## 2012-02-27 MED ORDER — POTASSIUM CHLORIDE CRYS ER 20 MEQ PO TBCR
40.0000 meq | EXTENDED_RELEASE_TABLET | Freq: Once | ORAL | Status: AC
  Administered 2012-02-27: 40 meq via ORAL
  Filled 2012-02-27: qty 2

## 2012-02-27 MED ORDER — OXYCODONE-ACETAMINOPHEN 5-325 MG PO TABS
1.0000 | ORAL_TABLET | Freq: Once | ORAL | Status: AC
  Administered 2012-02-27: 1 via ORAL
  Filled 2012-02-27: qty 1

## 2012-02-27 NOTE — Consult Note (Signed)
Reason for Consult:  Minimal amounts of traumatic hemorrhage into scar of area of right occipital encephalomalacia Referring Physician: Dr. Lorre Nick (EDP)  [name] is an 56 y.o. female.   HPI: Patient is a 56 year old right-handed white female who was brought by EMS to the [facility] emergency room for evaluation after having fallen at home. Workup was performed by Dr. Lorre Nick, the EDP, including CT of the brain without contrast revealed evidence of the previous right occipital craniotomy, a large area of right occipital encephalomalacia, and a minimal amount of traumatic hemorrhage within the scar within the area of encephalomalacia. Neurosurgical consultation was requested for further recommendations from a neurosurgical perspective.  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 She underwent craniotomy by Dr. Hilda Lias. She is continued to have a seizure disorder, that has been managed by [facility], previously by Dr. Ellison Carwin, but now by the nurse practitioner. She has been on Dilantin and most recently has had Vimpat added. Dr. Freida Busman has found to be Dilantin toxic with a Dilantin level of 38.8. However he also did give her a dose of Ativan in the emergency room tonight.  Unfortunately the patient is lethargic and unable to provide detailed history, but rather the history was provided by the patient's daughter and son both of who were present at the bedside. However the patient does describe some headache.  Past Medical History:  Past Medical History  Diagnosis Date  . Seizures   . DVT (deep venous thrombosis)   . Asthma   . Cerebral hemorrhage     Past Surgical History:  Past Surgical History  Procedure Date  . Cesarean section   . Brain surgery     Family History: No family history on file.  Social History:  reports that she has been smoking.   She does not have any smokeless tobacco history on file. She reports that she does not drink alcohol or use illicit drugs.  Allergies:  Allergies  Allergen Reactions  . Codeine Nausea And Vomiting  . Keppra (Levetiracetam)     Causes seizures.     Medications: I have reviewed the patient's current medications.  Review of systems: Unobtainable due to altered mental status.  Results for orders placed during the hospital encounter of 02/27/12 (from the past 48 hour(s))  PHENYTOIN LEVEL, TOTAL     Status: Abnormal   Collection Time   02/27/12  9:57 PM      Component Value Range Comment   Phenytoin Lvl 38.8 (*) 10.0 - 20.0 ug/mL   CBC WITH DIFFERENTIAL     Status: Abnormal   Collection Time   02/27/12 10:51 PM      Component Value Range Comment   WBC 7.6  4.0 - 10.5 K/uL    RBC 3.78 (*) 3.87 - 5.11 MIL/uL    Hemoglobin 13.0  12.0 - 15.0 g/dL    HCT 11.9  14.7 - 82.9 %    MCV 101.3 (*) 78.0 - 100.0 fL    MCH 34.4 (*) 26.0 - 34.0 pg    MCHC 33.9  30.0 - 36.0 g/dL    RDW 56.2  13.0 - 86.5 %    Platelets 124 (*) 150 - 400 K/uL    Neutrophils Relative 79 (*) 43 - 77 %    Neutro Abs 6.0  1.7 - 7.7 K/uL    Lymphocytes Relative 15  12 - 46 %  Lymphs Abs 1.1  0.7 - 4.0 K/uL    Monocytes Relative 6  3 - 12 %    Monocytes Absolute 0.5  0.1 - 1.0 K/uL    Eosinophils Relative 0  0 - 5 %    Eosinophils Absolute 0.0  0.0 - 0.7 K/uL    Basophils Relative 0  0 - 1 %    Basophils Absolute 0.0  0.0 - 0.1 K/uL   POCT I-STAT, CHEM 8     Status: Abnormal   Collection Time   02/27/12 11:07 PM      Component Value Range Comment   Sodium 141  135 - 145 mEq/L    Potassium 3.2 (*) 3.5 - 5.1 mEq/L    Chloride 105  96 - 112 mEq/L    BUN 11  6 - 23 mg/dL    Creatinine, Ser 1.61  0.50 - 1.10 mg/dL    Glucose, Bld 096 (*) 70 - 99 mg/dL    Calcium, Ion 0.45  4.09 - 1.23 mmol/L    TCO2 26  0 - 100 mmol/L    Hemoglobin 13.3  12.0 - 15.0 g/dL    HCT 81.1  91.4 - 78.2 %     Dg Chest 2  View  02/27/2012  *RADIOLOGY REPORT*  Clinical Data: Fall  CHEST - 2 VIEW  Comparison: August 16, 2010.  Findings: Cardiomediastinal silhouette appears normal.  No acute pulmonary disease is noted.  Bony thorax appears intact.  IMPRESSION: No acute cardiopulmonary abnormality seen.  Original Report Authenticated By: Venita Sheffield., M.D.   Ct Head Wo Contrast  02/27/2012  *RADIOLOGY REPORT*  Clinical Data: Fall, head laceration.  CT HEAD WITHOUT CONTRAST,CT CERVICAL SPINE WITHOUT CONTRAST  Technique:  Contiguous axial images were obtained from the base of the skull through the vertex without contrast.,Technique: Multidetector CT imaging of the cervical spine was performed. Multiplanar CT image reconstructions were also generated.  Comparison: 08/16/2010 MRI and CT.  Findings:  Head:  Right occipital lobe encephalomalacia is again noted. There has been interval development of curvilinear areas of high attenuation intermixed with the encephalomalacia and along the convexity of the occipital craniotomy, concerning for extra axial hemorrhage. No significant mass effect.  Otherwise, maintained gray-white differentiation.  No definite intraparenchymal hemorrhage or mass.  No definite CT evidence of acute infarction. No overt hydrocephalus. The craniotomy defect appears similar to priors. There is a right occipital paramidline nondisplaced fracture just below the level the craniotomy, which extends to midline as the fracture line extends inferiorly. There is associated overlying soft tissue swelling Mild opacification of the left mastoid air cells.  Otherwise, the visualized paranasal sinuses and mastoid air cells are predominately clear.  Cervical spine: Lung apices are predominately clear.  Nonspecific nodule measuring 1.1 cm arising from the right lobe of the thyroid gland.  Maintained craniocervical relationship.  No dens fracture. Multilevel degenerative changes, most pronounced at C5-6 and C6-7. No  paravertebral soft tissue abnormality.  No aggressive osseous lesion.  IMPRESSION: Mildly displaced right occipital bone fracture.  Interval development of high attenuation within the right occipital lobe encephalomalacia is concerning for extra axial hemorrhage.  No significant mass effect at this time.  Partially opacified left mastoid air cells.  Correlate clinically concerned for traumatic effusion or mastoiditis.  Multilevel cervical spine degenerative changes.  No acute fracture or dislocation identified.  Critical Value/emergent results were called by telephone at the time of interpretation on 02/27/2012 at 11:00 p.m. to Dr. Freida Busman, who verbally acknowledged  these results.  Original Report Authenticated By: Waneta Martins, M.D.   Ct Cervical Spine Wo Contrast  02/27/2012  *RADIOLOGY REPORT*  Clinical Data: Fall, head laceration.  CT HEAD WITHOUT CONTRAST,CT CERVICAL SPINE WITHOUT CONTRAST  Technique:  Contiguous axial images were obtained from the base of the skull through the vertex without contrast.,Technique: Multidetector CT imaging of the cervical spine was performed. Multiplanar CT image reconstructions were also generated.  Comparison: 08/16/2010 MRI and CT.  Findings:  Head:  Right occipital lobe encephalomalacia is again noted. There has been interval development of curvilinear areas of high attenuation intermixed with the encephalomalacia and along the convexity of the occipital craniotomy, concerning for extra axial hemorrhage. No significant mass effect.  Otherwise, maintained gray-white differentiation.  No definite intraparenchymal hemorrhage or mass.  No definite CT evidence of acute infarction. No overt hydrocephalus. The craniotomy defect appears similar to priors. There is a right occipital paramidline nondisplaced fracture just below the level the craniotomy, which extends to midline as the fracture line extends inferiorly. There is associated overlying soft tissue swelling Mild  opacification of the left mastoid air cells.  Otherwise, the visualized paranasal sinuses and mastoid air cells are predominately clear.  Cervical spine: Lung apices are predominately clear.  Nonspecific nodule measuring 1.1 cm arising from the right lobe of the thyroid gland.  Maintained craniocervical relationship.  No dens fracture. Multilevel degenerative changes, most pronounced at C5-6 and C6-7. No paravertebral soft tissue abnormality.  No aggressive osseous lesion.  IMPRESSION: Mildly displaced right occipital bone fracture.  Interval development of high attenuation within the right occipital lobe encephalomalacia is concerning for extra axial hemorrhage.  No significant mass effect at this time.  Partially opacified left mastoid air cells.  Correlate clinically concerned for traumatic effusion or mastoiditis.  Multilevel cervical spine degenerative changes.  No acute fracture or dislocation identified.  Critical Value/emergent results were called by telephone at the time of interpretation on 02/27/2012 at 11:00 p.m. to Dr. Freida Busman, who verbally acknowledged these results.  Original Report Authenticated By: Waneta Martins, M.D.    Physical examination: Blood pressure 106/52, pulse 84, temperature 98.3 F (36.8 C), temperature source Oral, resp. rate 20, SpO2 98.00%. Patient is a well-developed well-nourished white female, lethargic, but in no acute distress. External examination shows small 1 cm right occipital scalp laceration, no battle sign or raccoon sign. Mental status: Patient is lethargic, but oriented to name, [facility], and 2013. She follows commands, but her speech is slightly slurred.  Cranial nerves: Show pupils are equal round and reactive to light. Extraocular movements are intact. There is mild lateral gaze nystagmus. Facial sensation is intact. Facial movement is symmetrical. Hearing is present bilaterally. Palatal movement is symmetrical. Shoulder shrug is symmetrical.  Tongue is midline. Motor examination: 5 over 5 strength in the grips as well as dorsiflexors and plantar flexors bilaterally. Sensory examination: Grossly intact Reflexes: 1 at the biceps, minimal to quadriceps. Gait and stance are not tested due to the nature of her condition.   Assessment/Plan: Patient with history of previous right occipital intracerebral hematoma status post evacuation by Dr. Jeral Fruit in 1989. Patient also has a long-standing seizure disorder which is managed by [facility]. Patient fell earlier this evening and was found to have a minimal hemorrhage in the area of encephalomalacia. This causes no mass effect or shift. I do not anticipate the need for neurosurgical intervention.   Patient has also been found to be Dilantin toxic, with a Dilantin level  38.8. The patient's seizure management and anticonvulsant therapy will need to continue to be managed by [facility], who should be consulted promptly.  Dr. Freida Busman has arranged for the patient to be admitted to the [facility] hospital[facility] service. The neurosurgical service will follow along through the initial portion of this patient's hospitalization. A repeat CT of the brain without contrast has been requested for the morning of 02/29/2012.  I've spoken with the patient, her daughter, and her son, regarding her condition and our recommendations from a neurosurgical perspective. I've explained that I will be out of town as of 02/29/2012, and will ask Dr. Jeral Fruit to follow the patient in my absence as of 03/01/2012.  Hewitt Shorts, MD 02/27/2012, 11:37 PM

## 2012-02-27 NOTE — ED Provider Notes (Addendum)
Medical screening examination/treatment/procedure(s) were conducted as a shared visit with non-physician practitioner(s) and myself.  I personally evaluated the patient during the encounter  Patient seen here after falling at home this evening. States she slipped. No loss of consciousness. Is having pain at her rib cage and head. EMS called transported on long spine board. We'll check x-rays of her C-spine and head. Given Ativan for her anxiety. I checked her Dilantin level.  Toy Baker, MD 02/27/12 2153  11:19 PM Spoke with neurosurgeon on call, Dr. Newell Coral, and he will come and consult on the patient. He requested that the hospitalist admit the patient. I spoke with Dr. Nedra Hai and he will admit  Toy Baker, MD 02/27/12 [phone]

## 2012-02-27 NOTE — ED Notes (Signed)
Critical lab value for Dilantin.  Rolan Bucco, PA notified.

## 2012-02-27 NOTE — ED Provider Notes (Signed)
History     CSN: [id number]  Arrival date & time 02/27/12  2103   First MD Initiated Contact with Patient 02/27/12 2106      Chief Complaint  Patient presents with  . Fall  . Head Laceration    (Consider location/radiation/quality/duration/timing/severity/associated sxs/prior treatment) HPI Comments: [name] 56 y.o. female   The chief complaint is: Patient presents with:   Fall   Head Laceration   The patient has medical history significant for:   Past Medical History:   Seizures                                                     DVT (deep venous thrombosis)                                 Asthma                                                       Cerebral hemorrhage                                         Patient states that she fell down four concrete stairs earlier today. She hit her head and sustained a laceration. She denies LOC.  She was able to call her mother shortly after and denies feeling as though she had a seizure. She has been "feeling anxious, shaky and weak." over the past few days. She denies Any constitutional symptoms. She denies any weakness or slurred speech.  She denies any Loss of vision or visual changes.  She denies any abdominal discomfort, melena or hematochezia.  She c/o head and neck pain as well as some right rib pain, but denies any difficulty breathing.      Patient is a 56 y.o. female presenting with fall and scalp laceration. The history is provided by the patient and a relative. No language interpreter was used.  Fall The accident occurred 1 to 2 hours ago. She fell from a height of 1 to 2 ft. She landed on concrete. The volume of blood lost was minimal.  Head Laceration Associated symptoms include neck pain.    Past Medical History  Diagnosis Date  . Seizures   . DVT (deep venous thrombosis)   . Asthma   . Cerebral hemorrhage     Past Surgical History  Procedure Date  . Cesarean section   . Brain surgery     No  family history on file.  History  Substance Use Topics  . Smoking status: Current Everyday Smoker  . Smokeless tobacco: Not on file  . Alcohol Use: No    OB History    Grav Para Term Preterm Abortions TAB SAB Ect Mult Living                  Review of Systems  Constitutional: Negative.   HENT: Positive for neck pain.     Allergies  Codeine and Keppra  Home Medications   Current Outpatient Rx  Name  Route Sig Dispense Refill  . HYDROCODONE-ACETAMINOPHEN 5-500 MG PO TABS Oral Take 1 tablet by mouth every 6 (six) hours as needed. For pain     . LACOSAMIDE 100 MG PO TABS Oral Take 100 mg by mouth 2 (two) times daily.      Marland Kitchen PHENYTOIN SODIUM EXTENDED 100 MG PO CAPS Oral Take 200 mg by mouth 3 (three) times daily.     Marland Kitchen NEURO B-12 IJ Injection Inject 1,000 mcg as directed once.      Marland Kitchen VITAMIN D (ERGOCALCIFEROL) 50000 UNITS PO CAPS Oral Take 50,000 Units by mouth every 7 (seven) days.       BP 97/76  Pulse 85  Temp 98.3 F (36.8 C) (Oral)  Resp 22  SpO2 98%  Physical Exam  Constitutional: She is oriented to person, place, and time. She appears distressed.  HENT:  Head: Normocephalic.    Right Ear: Tympanic membrane normal.  Left Ear: Tympanic membrane normal.  Nose: Nose normal.       There is a 3cm superficial laceration on the posterior skull. No foreign bodies.   Eyes: Conjunctivae and EOM are normal. Pupils are equal, round, and reactive to light. Scleral icterus is present.  Neck: Normal range of motion. Neck supple. No tracheal deviation present.  Cardiovascular: Normal rate, regular rhythm and normal heart sounds.  Exam reveals no gallop and no friction rub.   No murmur heard. Pulmonary/Chest: Effort normal and breath sounds normal. No stridor. No respiratory distress. She has no wheezes. She exhibits no tenderness, no crepitus, no edema, no deformity and no swelling.       No ecchymosis  Abdominal: Soft. Bowel sounds are normal. She exhibits no distension.  There is no tenderness. There is no guarding.  Neurological: She is alert and oriented to person, place, and time. No cranial nerve deficit. She exhibits normal muscle tone.  Skin: Skin is warm and dry.    ED Course  Procedures (including critical care time)  Results for orders placed during the hospital encounter of 02/27/12  PHENYTOIN LEVEL, TOTAL      Component Value Range   Phenytoin Lvl 38.8 (*) 10.0 - 20.0 ug/mL  CBC WITH DIFFERENTIAL      Component Value Range   WBC 7.6  4.0 - 10.5 K/uL   RBC 3.78 (*) 3.87 - 5.11 MIL/uL   Hemoglobin 13.0  12.0 - 15.0 g/dL   HCT 16.1  09.6 - 04.5 %   MCV 101.3 (*) 78.0 - 100.0 fL   MCH 34.4 (*) 26.0 - 34.0 pg   MCHC 33.9  30.0 - 36.0 g/dL   RDW 40.9  81.1 - 91.4 %   Platelets 124 (*) 150 - 400 K/uL   Neutrophils Relative 79 (*) 43 - 77 %   Neutro Abs 6.0  1.7 - 7.7 K/uL   Lymphocytes Relative 15  12 - 46 %   Lymphs Abs 1.1  0.7 - 4.0 K/uL   Monocytes Relative 6  3 - 12 %   Monocytes Absolute 0.5  0.1 - 1.0 K/uL   Eosinophils Relative 0  0 - 5 %   Eosinophils Absolute 0.0  0.0 - 0.7 K/uL   Basophils Relative 0  0 - 1 %   Basophils Absolute 0.0  0.0 - 0.1 K/uL  POCT I-STAT, CHEM 8      Component Value Range   Sodium 141  135 - 145 mEq/L   Potassium 3.2 (*) 3.5 - 5.1 mEq/L   Chloride  105  96 - 112 mEq/L   BUN 11  6 - 23 mg/dL   Creatinine, Ser 6.57  0.50 - 1.10 mg/dL   Glucose, Bld 846 (*) 70 - 99 mg/dL   Calcium, Ion 9.62  9.52 - 1.23 mmol/L   TCO2 26  0 - 100 mmol/L   Hemoglobin 13.3  12.0 - 15.0 g/dL   HCT 84.1  32.4 - 40.1 %   Phenytoin levels are toxic will Dr.Allen will consult with Neurology. Hyperkalemia- correcting with PO potassium.  Ct shows a skull fracture with an extraaxial bleeding.  Results for orders placed during the hospital encounter of 02/27/12  PHENYTOIN LEVEL, TOTAL      Component Value Range   Phenytoin Lvl 38.8 (*) 10.0 - 20.0 ug/mL  CBC WITH DIFFERENTIAL      Component Value Range   WBC 7.6  4.0 -  10.5 K/uL   RBC 3.78 (*) 3.87 - 5.11 MIL/uL   Hemoglobin 13.0  12.0 - 15.0 g/dL   HCT 02.7  25.3 - 66.4 %   MCV 101.3 (*) 78.0 - 100.0 fL   MCH 34.4 (*) 26.0 - 34.0 pg   MCHC 33.9  30.0 - 36.0 g/dL   RDW 40.3  47.4 - 25.9 %   Platelets 124 (*) 150 - 400 K/uL   Neutrophils Relative 79 (*) 43 - 77 %   Neutro Abs 6.0  1.7 - 7.7 K/uL   Lymphocytes Relative 15  12 - 46 %   Lymphs Abs 1.1  0.7 - 4.0 K/uL   Monocytes Relative 6  3 - 12 %   Monocytes Absolute 0.5  0.1 - 1.0 K/uL   Eosinophils Relative 0  0 - 5 %   Eosinophils Absolute 0.0  0.0 - 0.7 K/uL   Basophils Relative 0  0 - 1 %   Basophils Absolute 0.0  0.0 - 0.1 K/uL  POCT I-STAT, CHEM 8      Component Value Range   Sodium 141  135 - 145 mEq/L   Potassium 3.2 (*) 3.5 - 5.1 mEq/L   Chloride 105  96 - 112 mEq/L   BUN 11  6 - 23 mg/dL   Creatinine, Ser 5.63  0.50 - 1.10 mg/dL   Glucose, Bld 875 (*) 70 - 99 mg/dL   Calcium, Ion 6.43  3.29 - 1.23 mmol/L   TCO2 26  0 - 100 mmol/L   Hemoglobin 13.3  12.0 - 15.0 g/dL   HCT 51.8  84.1 - 66.0 %   Dg Chest 2 View  02/27/2012  *RADIOLOGY REPORT*  Clinical Data: Fall  CHEST - 2 VIEW  Comparison: August 16, 2010.  Findings: Cardiomediastinal silhouette appears normal.  No acute pulmonary disease is noted.  Bony thorax appears intact.  IMPRESSION: No acute cardiopulmonary abnormality seen.  Original Report Authenticated By: Venita Sheffield., M.D.   Ct Head Wo Contrast  02/27/2012  *RADIOLOGY REPORT*  Clinical Data: Fall, head laceration.  CT HEAD WITHOUT CONTRAST,CT CERVICAL SPINE WITHOUT CONTRAST  Technique:  Contiguous axial images were obtained from the base of the skull through the vertex without contrast.,Technique: Multidetector CT imaging of the cervical spine was performed. Multiplanar CT image reconstructions were also generated.  Comparison: 08/16/2010 MRI and CT.  Findings:  Head:  Right occipital lobe encephalomalacia is again noted. There has been interval development of  curvilinear areas of high attenuation intermixed with the encephalomalacia and along the convexity of the occipital craniotomy, concerning for extra axial hemorrhage.  No significant mass effect.  Otherwise, maintained gray-white differentiation.  No definite intraparenchymal hemorrhage or mass.  No definite CT evidence of acute infarction. No overt hydrocephalus. The craniotomy defect appears similar to priors. There is a right occipital paramidline nondisplaced fracture just below the level the craniotomy, which extends to midline as the fracture line extends inferiorly. There is associated overlying soft tissue swelling Mild opacification of the left mastoid air cells.  Otherwise, the visualized paranasal sinuses and mastoid air cells are predominately clear.  Cervical spine: Lung apices are predominately clear.  Nonspecific nodule measuring 1.1 cm arising from the right lobe of the thyroid gland.  Maintained craniocervical relationship.  No dens fracture. Multilevel degenerative changes, most pronounced at C5-6 and C6-7. No paravertebral soft tissue abnormality.  No aggressive osseous lesion.  IMPRESSION: Mildly displaced right occipital bone fracture.  Interval development of high attenuation within the right occipital lobe encephalomalacia is concerning for extra axial hemorrhage.  No significant mass effect at this time.  Partially opacified left mastoid air cells.  Correlate clinically concerned for traumatic effusion or mastoiditis.  Multilevel cervical spine degenerative changes.  No acute fracture or dislocation identified.  Critical Value/emergent results were called by telephone at the time of interpretation on 02/27/2012 at 11:00 p.m. to Dr. Freida Busman, who verbally acknowledged these results.  Original Report Authenticated By: Waneta Martins, M.D.   Ct Cervical Spine Wo Contrast  02/27/2012  *RADIOLOGY REPORT*  Clinical Data: Fall, head laceration.  CT HEAD WITHOUT CONTRAST,CT CERVICAL SPINE WITHOUT  CONTRAST  Technique:  Contiguous axial images were obtained from the base of the skull through the vertex without contrast.,Technique: Multidetector CT imaging of the cervical spine was performed. Multiplanar CT image reconstructions were also generated.  Comparison: 08/16/2010 MRI and CT.  Findings:  Head:  Right occipital lobe encephalomalacia is again noted. There has been interval development of curvilinear areas of high attenuation intermixed with the encephalomalacia and along the convexity of the occipital craniotomy, concerning for extra axial hemorrhage. No significant mass effect.  Otherwise, maintained gray-white differentiation.  No definite intraparenchymal hemorrhage or mass.  No definite CT evidence of acute infarction. No overt hydrocephalus. The craniotomy defect appears similar to priors. There is a right occipital paramidline nondisplaced fracture just below the level the craniotomy, which extends to midline as the fracture line extends inferiorly. There is associated overlying soft tissue swelling Mild opacification of the left mastoid air cells.  Otherwise, the visualized paranasal sinuses and mastoid air cells are predominately clear.  Cervical spine: Lung apices are predominately clear.  Nonspecific nodule measuring 1.1 cm arising from the right lobe of the thyroid gland.  Maintained craniocervical relationship.  No dens fracture. Multilevel degenerative changes, most pronounced at C5-6 and C6-7. No paravertebral soft tissue abnormality.  No aggressive osseous lesion.  IMPRESSION: Mildly displaced right occipital bone fracture.  Interval development of high attenuation within the right occipital lobe encephalomalacia is concerning for extra axial hemorrhage.  No significant mass effect at this time.  Partially opacified left mastoid air cells.  Correlate clinically concerned for traumatic effusion or mastoiditis.  Multilevel cervical spine degenerative changes.  No acute fracture or  dislocation identified.  Critical Value/emergent results were called by telephone at the time of interpretation on 02/27/2012 at 11:00 p.m. to Dr. Freida Busman, who verbally acknowledged these results.  Original Report Authenticated By: Waneta Martins, M.D.     no Rib fractures .  LACERATION REPAIR Performed by: Arthor Captain Authorized by: Arthor Captain Consent: Verbal consent  obtained. Risks and benefits: risks, benefits and alternatives were discussed Consent given by: patient Patient identity confirmed: provided demographic data Prepped and Draped in normal sterile fashion Wound explored  Laceration Location: posterior right scalp  Laceration Length: 3 cm  No Foreign Bodies seen or palpated  Anesthesia: local infiltration  Local anesthetic: lidocaine 2% with epinephrine  Anesthetic total: 3 ml  Irrigation method: syringe Amount of cleaning: standard  Skin closure: staples  Number of staples: 3  Patient tolerance: Patient tolerated the procedure well with no immediate complications.  No results found.   No diagnosis found.    MDM  Patient with phenytoin toxicity, skull fracture will be admitted. Arthor Captain 12:38 AM         Arthor Captain, PA-C 02/28/12 [phone]

## 2012-02-27 NOTE — ED Notes (Signed)
Patient fell down 4 stairs at home, hitting back of right head, laceration with bleeding controlled.  Patient did not have any LOC, full recall of incident.  Patient is speaking in full sentences and having muscle twitching.  Patient states that she is having a seizure with the muscle twitching.  Patient is CAOx3.

## 2012-02-27 NOTE — ED Notes (Signed)
Patient returned from CT

## 2012-02-27 NOTE — ED Notes (Signed)
Patient fell down 4 stairs at home.  Patient does have a laceration on back of right head.  Patient is on LSB, ccollar.  Patient is having right rib, hip and left hand pain.  No LOC, remembers fall but having repetative questioning.  Unknown if this is her baseline due to history.

## 2012-02-28 ENCOUNTER — Encounter (HOSPITAL_COMMUNITY): Payer: Self-pay | Admitting: Internal Medicine

## 2012-02-28 ENCOUNTER — Inpatient Hospital Stay (HOSPITAL_COMMUNITY): Payer: Medicare Other

## 2012-02-28 DIAGNOSIS — S0291XA Unspecified fracture of skull, initial encounter for closed fracture: Secondary | ICD-10-CM | POA: Diagnosis present

## 2012-02-28 DIAGNOSIS — G40909 Epilepsy, unspecified, not intractable, without status epilepticus: Secondary | ICD-10-CM | POA: Diagnosis present

## 2012-02-28 DIAGNOSIS — T420X1A Poisoning by hydantoin derivatives, accidental (unintentional), initial encounter: Secondary | ICD-10-CM | POA: Diagnosis present

## 2012-02-28 DIAGNOSIS — I629 Nontraumatic intracranial hemorrhage, unspecified: Secondary | ICD-10-CM | POA: Diagnosis present

## 2012-02-28 LAB — HEPATIC FUNCTION PANEL
AST: 31 U/L (ref 0–37)
Albumin: 3.4 g/dL — ABNORMAL LOW (ref 3.5–5.2)
Total Bilirubin: 0.3 mg/dL (ref 0.3–1.2)
Total Protein: 5.7 g/dL — ABNORMAL LOW (ref 6.0–8.3)

## 2012-02-28 LAB — BASIC METABOLIC PANEL
Chloride: 104 mEq/L (ref 96–112)
Creatinine, Ser: 0.62 mg/dL (ref 0.50–1.10)
GFR calc Af Amer: >90 mL/min (ref >90)
GFR calc non Af Amer: >90 mL/min (ref >90)
Potassium: 4.2 mEq/L (ref 3.5–5.1)

## 2012-02-28 LAB — MRSA PCR SCREENING: MRSA by PCR: NEGATIVE

## 2012-02-28 LAB — CBC
HCT: 36.2 % (ref 36.0–46.0)
RDW: 13.2 % (ref 11.5–15.5)
WBC: 5.1 10*3/uL (ref 4.0–10.5)

## 2012-02-28 MED ORDER — PROMETHAZINE HCL 25 MG/ML IJ SOLN
12.5000 mg | Freq: Four times a day (QID) | INTRAMUSCULAR | Status: DC | PRN
  Administered 2012-02-29 (×2): 12.5 mg via INTRAVENOUS
  Filled 2012-02-28 (×3): qty 1

## 2012-02-28 MED ORDER — ACETAMINOPHEN 325 MG PO TABS
650.0000 mg | ORAL_TABLET | Freq: Four times a day (QID) | ORAL | Status: DC | PRN
Start: 2012-02-28 — End: 2012-03-05
  Administered 2012-02-29 – 2012-03-04 (×3): 650 mg via ORAL
  Filled 2012-02-28 (×3): qty 2

## 2012-02-28 MED ORDER — HYDROMORPHONE HCL PF 1 MG/ML IJ SOLN
1.0000 mg | INTRAMUSCULAR | Status: DC | PRN
Start: 2012-02-28 — End: 2012-02-28
  Administered 2012-02-28: 1 mg via INTRAVENOUS
  Filled 2012-02-28: qty 1

## 2012-02-28 MED ORDER — ONDANSETRON HCL 4 MG/2ML IJ SOLN
4.0000 mg | INTRAMUSCULAR | Status: DC | PRN
  Administered 2012-02-28 – 2012-02-29 (×4): 4 mg via INTRAVENOUS
  Filled 2012-02-28 (×4): qty 2

## 2012-02-28 MED ORDER — OXYCODONE HCL 5 MG PO TABS
5.0000 mg | ORAL_TABLET | ORAL | Status: DC | PRN
  Administered 2012-02-28 – 2012-03-05 (×18): 10 mg via ORAL
  Filled 2012-02-28 (×13): qty 2
  Filled 2012-02-28: qty 1
  Filled 2012-02-28: qty 2
  Filled 2012-02-28: qty 1
  Filled 2012-02-28 (×3): qty 2

## 2012-02-28 MED ORDER — ONDANSETRON HCL 4 MG/2ML IJ SOLN
4.0000 mg | Freq: Four times a day (QID) | INTRAMUSCULAR | Status: DC | PRN
  Administered 2012-02-28: 4 mg via INTRAVENOUS
  Filled 2012-02-28: qty 2

## 2012-02-28 MED ORDER — LACOSAMIDE 50 MG PO TABS
100.0000 mg | ORAL_TABLET | Freq: Two times a day (BID) | ORAL | Status: DC
  Administered 2012-02-28 – 2012-03-05 (×13): 100 mg via ORAL
  Filled 2012-02-28 (×2): qty 2
  Filled 2012-02-28 (×4): qty 1
  Filled 2012-02-28: qty 2
  Filled 2012-02-28 (×2): qty 1
  Filled 2012-02-28: qty 2
  Filled 2012-02-28: qty 1
  Filled 2012-02-28 (×4): qty 2
  Filled 2012-02-28: qty 1
  Filled 2012-02-28: qty 2

## 2012-02-28 MED ORDER — SODIUM CHLORIDE 0.9 % IV SOLN: INTRAVENOUS | Status: DC

## 2012-02-28 MED ORDER — ONDANSETRON HCL 4 MG PO TABS: 4.0000 mg | ORAL_TABLET | Freq: Four times a day (QID) | ORAL | Status: DC | PRN

## 2012-02-28 MED ORDER — HYDROMORPHONE HCL PF 1 MG/ML IJ SOLN
1.0000 mg | INTRAMUSCULAR | Status: DC | PRN
  Administered 2012-02-28 – 2012-03-04 (×5): 1 mg via INTRAVENOUS
  Filled 2012-02-28 (×5): qty 1

## 2012-02-28 MED ORDER — ONDANSETRON HCL 4 MG PO TABS
4.0000 mg | ORAL_TABLET | ORAL | Status: DC | PRN
  Administered 2012-03-04 – 2012-03-05 (×2): 4 mg via ORAL
  Filled 2012-02-28 (×2): qty 1

## 2012-02-28 MED ORDER — BIOTENE DRY MOUTH MT LIQD
15.0000 mL | Freq: Two times a day (BID) | OROMUCOSAL | Status: DC
  Administered 2012-02-28 – 2012-03-05 (×11): 15 mL via OROMUCOSAL

## 2012-02-28 MED ORDER — SODIUM CHLORIDE 0.9 % IJ SOLN
3.0000 mL | Freq: Two times a day (BID) | INTRAMUSCULAR | Status: DC
  Administered 2012-02-28 (×2): 3 mL via INTRAVENOUS

## 2012-02-28 MED ORDER — DEXTROSE-NACL 5-0.9 % IV SOLN
INTRAVENOUS | Status: DC
  Administered 2012-02-28: 15:00:00 via INTRAVENOUS
  Administered 2012-02-28: 1000 mL via INTRAVENOUS
  Administered 2012-02-29: 05:00:00 via INTRAVENOUS

## 2012-02-28 NOTE — Progress Notes (Signed)
Patient mental status decreased from alert to arousable. Patient vomiting. Doctor notified.

## 2012-02-28 NOTE — Progress Notes (Addendum)
[facility] Progress Note [place] TEAM 1 - Stepdown/ICU TEAM   ALVIN RUBANO ZOX:[id number] DOB: 11-14-1955 DOA: 02/27/2012 PCP: Pearson Grippe, MD  Brief narrative: 56 y.o. female with hx of seizure, s/p prior craniotomy for intracranial hematoma many years ago, with prior hx of dilantin toxicity, last seizure over 6 months ago, hx of DVT, asthma, who felt ataxic and fell hitting her head. 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 Neurosurgical consult was done and felt that no neurosurgical intervention was indicated.  Assessment/Plan:  Dilantin toxicity Presenting Dilantin level was 38.8 - hydrating - followup levels in a.m.  Skull fracture status post fall A repeat CT scan of the head will be accomplished in 24 hours  Intracranial hemorrhage due to above  Seizure disorder  History of DVT Clearly cannot use pharmacologic prophylaxis at this time  Asthma Well compensated at present  Code Status: Full Disposition Plan: Observe on step down unit  Consultants: Neurosurgery 7/26 Neurology 7/27  Procedures: None  Antibiotics: None  HPI/Subjective: Patient is seen for a followup visit   Objective: Blood pressure 107/71, pulse 75, temperature 98.7 F (37.1 C), temperature source Oral, resp. rate 18, height 5\' 7"  (1.702 m), weight 56.7 kg (125 lb), SpO2 96.00%.  Intake/Output Summary (Last 24 hours) at 02/28/12 1125 Last data filed at 02/28/12 0900  Gross per 24 hour  Intake    450 ml  Output    700 ml  Net   -250 ml     Exam: Followup exam is completed  Data Reviewed: Basic Metabolic Panel:  Lab 02/28/12 8119 02/27/12 2307  NA 139 141  K 4.2 3.2*  CL 104 105  CO2 26 --  GLUCOSE 127* 105*  BUN 10 11  CREATININE 0.62 0.80  CALCIUM 8.6 --  MG -- --  PHOS -- --   Liver Function Tests:  Lab 02/28/12 0455  AST 31  ALT 21  ALKPHOS 52  BILITOT 0.3  PROT 5.7*  ALBUMIN 3.4*    CBC:  Lab 02/28/12 0455 02/27/12 2307 02/27/12 2251  WBC 5.1 -- 7.6  NEUTROABS -- -- 6.0  HGB 12.1 13.3 13.0  HCT 36.2 39.0 38.3  MCV 102.3* -- 101.3*  PLT 125* -- 124*   Recent Results (from the past 240 hour(s))  MRSA PCR SCREENING     Status: Normal   Collection Time   02/28/12  2:03 AM      Component Value Range Status Comment   MRSA by PCR NEGATIVE  NEGATIVE Final     Studies:  Recent x-ray studies have been reviewed in detail by the Attending Physician  Scheduled Meds:  Reviewed in detail by the Attending Physician   Lonia Blood, MD [facility] Office  [phone] Pager [phone]  On-Call/Text Page:      [email or web address]      password [email or web address]  If 7PM-7AM, please contact night-coverage [email or web address] Password [email or web address] 02/28/2012, 11:25 AM   LOS: 1 day

## 2012-02-28 NOTE — Consult Note (Signed)
[facility] CONSULT NOTE     Reason for Consult:  Seizure disorder patient who was admitted with acute head trauma with skull fractur   HPI:    [name] is an 56 y.o. female with a history of previous occipital hemorrhage with craniotomy for surgical evacuation, as well as seizure disorder, brought to the emergency room following a fall at home. CT scan of the head showed slightly displaced right occipital fracture as well as cranial defect from craniotomy. There was encephalomalacia as well as a small amount of hemorrhage in the scar area of right occipital encephalomalacia. Patient has been experiencing dizziness and gait instability. She was walking down her back steps when she fell. Dilantin level was noted to be toxic (38.8). Patient also takes Vimpat for seizure management. Her last seizure, according to the patient, was about one year ago. It's unclear whether she lost consciousness when she fell.  Past Medical History  Diagnosis Date  . Seizures   . DVT (deep venous thrombosis)   . Asthma   . Cerebral hemorrhage     Past Surgical History  Procedure Date  . Cesarean section   . Brain surgery     No family history on file.  Social History:  reports that she has been smoking.  She does not have any smokeless tobacco history on file. She reports that she does not drink alcohol or use illicit drugs.  Allergies  Allergen Reactions  . Codeine Nausea And Vomiting  . Keppra (Levetiracetam)     Causes seizures.     Medications:    Scheduled:   . lacosamide  100 mg Oral BID  . LORazepam  1 mg Intravenous Once  . oxyCODONE-acetaminophen  1 tablet Oral Once  . potassium chloride SA  40 mEq Oral Once  . DISCONTD: sodium chloride   Intravenous STAT  . DISCONTD: sodium chloride  3 mL Intravenous Q12H   ZOX:[id number], HYDROmorphone (DILAUDID) injection, ondansetron (ZOFRAN) IV, ondansetron, oxyCODONE, DISCONTD:  HYDROmorphone  (DILAUDID) injection    Blood pressure 107/71, pulse 75, temperature 98.7 F (37.1 C), temperature source Oral, resp. rate 18, height 5\' 7"  (1.702 m), weight 56.7 kg (125 lb), SpO2 96.00%.   Neurologic Examination:  Mental Status: Lethargic, but oriented x3.Marland Kitchen  Speech fluent without evidence of aphasia. Able to follow commands without difficulty. Cranial Nerves: II-left homonymous hemianopsia. III/IV/VI-Pupils were equal and reacted. Extraocular movements were full and conjugate.    V/VII-no facial numbness and no facial weakness. VIII-normal. X-normal speech and symmetrical palatal movement. Motor: 5/5 bilaterally with normal tone and bulk; slight benign head tremor. Sensory: Normal throughout. Deep Tendon Reflexes: 2+ and symmetric. Plantars: Mute bilaterally Cerebellar: Normal finger-to-nose testing. Carotid auscultation: Normal    No results found for this basename: cbc, bmp, coags, chol, tri, ldl, hga1c      Dg Chest 2 View  02/27/2012  *RADIOLOGY REPORT*  Clinical Data: Fall  CHEST - 2 VIEW  Comparison: August 16, 2010.  Findings: Cardiomediastinal silhouette appears normal.  No acute pulmonary disease is noted.  Bony thorax appears intact.  IMPRESSION: No acute cardiopulmonary abnormality seen.  Original Report Authenticated By: Venita Sheffield., M.D.   Ct Head Wo Contrast  02/27/2012  *RADIOLOGY REPORT*  Clinical Data: Fall, head laceration.  CT HEAD WITHOUT CONTRAST,CT CERVICAL SPINE WITHOUT CONTRAST  Technique:  Contiguous axial images were obtained from the  base of the skull through the vertex without contrast.,Technique: Multidetector CT imaging of the cervical spine was performed. Multiplanar CT image reconstructions were also generated.  Comparison: 08/16/2010 MRI and CT.  Findings:  Head:  Right occipital lobe encephalomalacia is again noted. There has been interval development of curvilinear areas of high attenuation intermixed with the encephalomalacia and along  the convexity of the occipital craniotomy, concerning for extra axial hemorrhage. No significant mass effect.  Otherwise, maintained gray-white differentiation.  No definite intraparenchymal hemorrhage or mass.  No definite CT evidence of acute infarction. No overt hydrocephalus. The craniotomy defect appears similar to priors. There is a right occipital paramidline nondisplaced fracture just below the level the craniotomy, which extends to midline as the fracture line extends inferiorly. There is associated overlying soft tissue swelling Mild opacification of the left mastoid air cells.  Otherwise, the visualized paranasal sinuses and mastoid air cells are predominately clear.  Cervical spine: Lung apices are predominately clear.  Nonspecific nodule measuring 1.1 cm arising from the right lobe of the thyroid gland.  Maintained craniocervical relationship.  No dens fracture. Multilevel degenerative changes, most pronounced at C5-6 and C6-7. No paravertebral soft tissue abnormality.  No aggressive osseous lesion.  IMPRESSION: Mildly displaced right occipital bone fracture.  Interval development of high attenuation within the right occipital lobe encephalomalacia is concerning for extra axial hemorrhage.  No significant mass effect at this time.  Partially opacified left mastoid air cells.  Correlate clinically concerned for traumatic effusion or mastoiditis.  Multilevel cervical spine degenerative changes.  No acute fracture or dislocation identified.  Critical Value/emergent results were called by telephone at the time of interpretation on 02/27/2012 at 11:00 p.m. to Dr. Freida Busman, who verbally acknowledged these results.  Original Report Authenticated By: Waneta Martins, M.D.   Ct Cervical Spine Wo Contrast  02/27/2012  *RADIOLOGY REPORT*  Clinical Data: Fall, head laceration.  CT HEAD WITHOUT CONTRAST,CT CERVICAL SPINE WITHOUT CONTRAST  Technique:  Contiguous axial images were obtained from the base of the  skull through the vertex without contrast.,Technique: Multidetector CT imaging of the cervical spine was performed. Multiplanar CT image reconstructions were also generated.  Comparison: 08/16/2010 MRI and CT.  Findings:  Head:  Right occipital lobe encephalomalacia is again noted. There has been interval development of curvilinear areas of high attenuation intermixed with the encephalomalacia and along the convexity of the occipital craniotomy, concerning for extra axial hemorrhage. No significant mass effect.  Otherwise, maintained gray-white differentiation.  No definite intraparenchymal hemorrhage or mass.  No definite CT evidence of acute infarction. No overt hydrocephalus. The craniotomy defect appears similar to priors. There is a right occipital paramidline nondisplaced fracture just below the level the craniotomy, which extends to midline as the fracture line extends inferiorly. There is associated overlying soft tissue swelling Mild opacification of the left mastoid air cells.  Otherwise, the visualized paranasal sinuses and mastoid air cells are predominately clear.  Cervical spine: Lung apices are predominately clear.  Nonspecific nodule measuring 1.1 cm arising from the right lobe of the thyroid gland.  Maintained craniocervical relationship.  No dens fracture. Multilevel degenerative changes, most pronounced at C5-6 and C6-7. No paravertebral soft tissue abnormality.  No aggressive osseous lesion.  IMPRESSION: Mildly displaced right occipital bone fracture.  Interval development of high attenuation within the right occipital lobe encephalomalacia is concerning for extra axial hemorrhage.  No significant mass effect at this time.  Partially opacified left mastoid air cells.  Correlate clinically concerned for traumatic effusion or  mastoiditis.  Multilevel cervical spine degenerative changes.  No acute fracture or dislocation identified.  Critical Value/emergent results were called by telephone at the  time of interpretation on 02/27/2012 at 11:00 p.m. to Dr. Freida Busman, who verbally acknowledged these results.  Original Report Authenticated By: Waneta Martins, M.D.     Assessment/Plan:  1. Acute head trauma with right occipital slightly displaced fracture. 2. Slight hemorrhage and scar region of right occipital encephalomalacia. 3. Dilantin toxicity with dizziness and ataxia, likely contributing to #1 and #2 above. 4. Status post craniotomy for right occipital hematoma evacuation. 5. Chronic seizure disorder.  Recommendations: 1. Hold Dilantin until Dilantin level has returned to therapeutic range; we'll restart Dilantin at a lower dose than prior to admission, probably at 400 mg per day in divided dose. 2. Continue Vimpat 100 mg twice a day. 3. Physical therapy evaluation of patient's gait, and recommendations for ambulation safety. 4. Repeat CT of the head for comparison with study on 02/27/2012 to rule out progressive intracranial hemorrhage, as well as to rule out signs of cerebral edema.  Thank you for asking me to participate in this patient's care. We will continue to follow her with you.  Venetia Maxon M.D. [facility] [phone]  02/28/2012, 12:12 PM

## 2012-02-28 NOTE — Progress Notes (Signed)
Subjective: Patient resting in bed, complaining of some chest wall discomfort to the lower right anterior chest wall. Dilantin level not rechecked this morning. Not seen yet in neurology consultation by her treating neurologists at  [facility] despite presenting with Dilantin toxicity. Patient says that prior to her fall she been having dizziness, presumably due to her Dilantin toxicity. She says that she is followed by Dr. Annice Pih at [facility].  Objective: Vital signs in last 24 hours: Filed Vitals:   02/28/12 0500 02/28/12 0600 02/28/12 0700 02/28/12 0758  BP: 102/55 105/64 107/71 107/71  Pulse: 73 73 74 75  Temp:    98.7 F (37.1 C)  TempSrc:      Resp: 19 19 14 18   Height:      Weight:      SpO2:        Intake/Output from previous day: 07/26 0701 - 07/27 0700 In: 225 [I.V.:225] Out: 700 [Urine:700] Intake/Output this shift:    Physical Exam:  Much more awake and alert this morning, oriented to name, [facility] hospital, 2013. Following commands, speech fluent. Extra ocular movements intact. Facial movements symmetrical. Moving all 4 extremities well. No drift of the upper extremities.  CBC  Basename 02/28/12 0455 02/27/12 2307 02/27/12 2251  WBC 5.1 -- 7.6  HGB 12.1 13.3 --  HCT 36.2 39.0 --  PLT 125* -- 124*   BMET  Basename 02/28/12 0455 02/27/12 2307  NA 139 141  K 4.2 3.2*  CL 104 105  CO2 26 --  GLUCOSE 127* 105*  BUN 10 11  CREATININE 0.62 0.80  CALCIUM 8.6 --    Studies/Results: Dg Chest 2 View  02/27/2012  *RADIOLOGY REPORT*  Clinical Data: Fall  CHEST - 2 VIEW  Comparison: August 16, 2010.  Findings: Cardiomediastinal silhouette appears normal.  No acute pulmonary disease is noted.  Bony thorax appears intact.  IMPRESSION: No acute cardiopulmonary abnormality seen.  Original Report Authenticated By: Venita Sheffield., M.D.   Ct Head Wo Contrast  02/27/2012  *RADIOLOGY REPORT*  Clinical Data: Fall, head  laceration.  CT HEAD WITHOUT CONTRAST,CT CERVICAL SPINE WITHOUT CONTRAST  Technique:  Contiguous axial images were obtained from the base of the skull through the vertex without contrast.,Technique: Multidetector CT imaging of the cervical spine was performed. Multiplanar CT image reconstructions were also generated.  Comparison: 08/16/2010 MRI and CT.  Findings:  Head:  Right occipital lobe encephalomalacia is again noted. There has been interval development of curvilinear areas of high attenuation intermixed with the encephalomalacia and along the convexity of the occipital craniotomy, concerning for extra axial hemorrhage. No significant mass effect.  Otherwise, maintained gray-white differentiation.  No definite intraparenchymal hemorrhage or mass.  No definite CT evidence of acute infarction. No overt hydrocephalus. The craniotomy defect appears similar to priors. There is a right occipital paramidline nondisplaced fracture just below the level the craniotomy, which extends to midline as the fracture line extends inferiorly. There is associated overlying soft tissue swelling Mild opacification of the left mastoid air cells.  Otherwise, the visualized paranasal sinuses and mastoid air cells are predominately clear.  Cervical spine: Lung apices are predominately clear.  Nonspecific nodule measuring 1.1 cm arising from the right lobe of the thyroid gland.  Maintained craniocervical relationship.  No dens fracture. Multilevel degenerative changes, most pronounced at C5-6 and C6-7. No paravertebral soft tissue abnormality.  No aggressive osseous lesion.  IMPRESSION: Mildly displaced right occipital bone fracture.  Interval development of high attenuation  within the right occipital lobe encephalomalacia is concerning for extra axial hemorrhage.  No significant mass effect at this time.  Partially opacified left mastoid air cells.  Correlate clinically concerned for traumatic effusion or mastoiditis.  Multilevel  cervical spine degenerative changes.  No acute fracture or dislocation identified.  Critical Value/emergent results were called by telephone at the time of interpretation on 02/27/2012 at 11:00 p.m. to Dr. Freida Busman, who verbally acknowledged these results.  Original Report Authenticated By: Waneta Martins, M.D.   Ct Cervical Spine Wo Contrast  02/27/2012  *RADIOLOGY REPORT*  Clinical Data: Fall, head laceration.  CT HEAD WITHOUT CONTRAST,CT CERVICAL SPINE WITHOUT CONTRAST  Technique:  Contiguous axial images were obtained from the base of the skull through the vertex without contrast.,Technique: Multidetector CT imaging of the cervical spine was performed. Multiplanar CT image reconstructions were also generated.  Comparison: 08/16/2010 MRI and CT.  Findings:  Head:  Right occipital lobe encephalomalacia is again noted. There has been interval development of curvilinear areas of high attenuation intermixed with the encephalomalacia and along the convexity of the occipital craniotomy, concerning for extra axial hemorrhage. No significant mass effect.  Otherwise, maintained gray-white differentiation.  No definite intraparenchymal hemorrhage or mass.  No definite CT evidence of acute infarction. No overt hydrocephalus. The craniotomy defect appears similar to priors. There is a right occipital paramidline nondisplaced fracture just below the level the craniotomy, which extends to midline as the fracture line extends inferiorly. There is associated overlying soft tissue swelling Mild opacification of the left mastoid air cells.  Otherwise, the visualized paranasal sinuses and mastoid air cells are predominately clear.  Cervical spine: Lung apices are predominately clear.  Nonspecific nodule measuring 1.1 cm arising from the right lobe of the thyroid gland.  Maintained craniocervical relationship.  No dens fracture. Multilevel degenerative changes, most pronounced at C5-6 and C6-7. No paravertebral soft tissue  abnormality.  No aggressive osseous lesion.  IMPRESSION: Mildly displaced right occipital bone fracture.  Interval development of high attenuation within the right occipital lobe encephalomalacia is concerning for extra axial hemorrhage.  No significant mass effect at this time.  Partially opacified left mastoid air cells.  Correlate clinically concerned for traumatic effusion or mastoiditis.  Multilevel cervical spine degenerative changes.  No acute fracture or dislocation identified.  Critical Value/emergent results were called by telephone at the time of interpretation on 02/27/2012 at 11:00 p.m. to Dr. Freida Busman, who verbally acknowledged these results.  Original Report Authenticated By: Waneta Martins, M.D.    Assessment/Plan: Stable from a neurosurgical perspective. Much more responsive at this time as compared to last night. To have followup CT brain without contrast in a.m. Continue to recommend neurology consultation and followup for their continued care and management of her seizure disorder and anticonvulsant therapy.   Hewitt Shorts, MD 02/28/2012, 9:29 AM

## 2012-02-28 NOTE — ED Notes (Signed)
3 staples on right side of head for laceration repair.

## 2012-02-28 NOTE — Progress Notes (Signed)
Dr. Sharon Seller made aware of C.T. Scan.

## 2012-02-28 NOTE — H&P (Signed)
[facility] History and Physical  [name] VWU:[id number] DOB: 1956/03/10    PCP:   Pearson Grippe, MD   Chief Complaint: ataxia, falling.  HPI: [name] is an 56 y.o. female with hx of seizure, s/p prior craniotomy for intracranial hematoma many years ago, with prior hx of dilantin toxicity, last seizure over 6 months ago, hx of DVT, asthma, felt ataxic and fell hitting her head.  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  Neurosurgical consult was done and felt that no neurosurgical intervention is indicated.  She was given Ativan by EDP and is rather lethargic at this time.  Further evaluation include a dilantin level of 38.8, with normal WBC, and EKG shows no conduction delay with QTc less than 500 ms. Hospitalist was asked to admit her for Dilantin toxicity, ataxia, and falling with intracranial bleed and skull fracture.  At home, she lives with her daughter and son.  Rewiew of Systems: Unable to complete    Past Medical History  Diagnosis Date  . Seizures   . DVT (deep venous thrombosis)   . Asthma   . Cerebral hemorrhage     Past Surgical History  Procedure Date  . Cesarean section   . Brain surgery     Medications:  HOME MEDS: Prior to Admission medications   Medication Sig Start Date End Date Taking? Authorizing Provider  HYDROcodone-acetaminophen (VICODIN) 5-500 MG per tablet Take 1 tablet by mouth every 6 (six) hours as needed. For pain    Yes Historical Provider, MD  Lacosamide (VIMPAT) 100 MG TABS Take 100 mg by mouth 2 (two) times daily.     Yes Historical Provider, MD  phenytoin (DILANTIN) 100 MG ER capsule Take 200 mg by mouth 3 (three) times daily.    Yes Historical Provider, MD  Vitamin B1-B12 (NEURO B-12 IJ) Inject 1,000 mcg as directed once.     Yes Historical Provider, MD  Vitamin D, Ergocalciferol, (DRISDOL) 50000 UNITS CAPS Take 50,000 Units by mouth every 7 (seven) days.     Yes Historical Provider, MD     Allergies:  Allergies  Allergen Reactions  . Codeine Nausea And Vomiting  . Keppra (Levetiracetam)     Causes seizures.     Social History:   reports that she has been smoking.  She does not have any smokeless tobacco history on file. She reports that she does not drink alcohol or use illicit drugs.  Family History: No family history on file.   Physical Exam: Filed Vitals:   02/27/12 2109 02/27/12 2200 02/27/12 2314 02/28/12 0000  BP: 97/76 113/73 106/52 100/89  Pulse: 85  84 85  Temp: 98.3 F (36.8 C)     TempSrc: Oral     Resp: 22 15 20 23   SpO2: 98%  98% 95%   Blood pressure 100/89, pulse 85, temperature 98.3 F (36.8 C), temperature source Oral, resp. rate 23, SpO2 95.00%.  GEN:  Pleasant  patient lying in the stretcher in no acute distress; cooperative with exam. PSYCH:  She is alert but confused; does not appear anxious or depressed; affect is appropriate. HEENT: Mucous membranes pink and anicteric; PERRLA; EOM intact; no cervical lymphadenopathy nor thyromegaly or carotid bruit; no JVD; There were no stridor. Neck is very supple.  Positve for nystagmus Breasts:: Not examined CHEST WALL: No tenderness CHEST: Normal respiration, clear to auscultation bilaterally.  HEART: Regular rate and rhythm.  There are no murmur, rub, or gallops.  BACK: No kyphosis or scoliosis; no CVA tenderness ABDOMEN: soft and non-tender; no masses, no organomegaly, normal abdominal bowel sounds; no pannus; no intertriginous candida. There is no rebound and no distention. Rectal Exam: Not done EXTREMITIES: No bone or joint deformity; age-appropriate arthropathy of the hands and knees; no edema; no ulcerations.  There is no calf tenderness. Genitalia: not examined PULSES: 2+ and symmetric SKIN: Normal hydration no rash or ulceration CNS: She was able to recite her PCP as Dr Pearson Grippe.  She converse meaningfully, but is lethargic.  She does have normal  reflexes and moves all her extremities. Labs on Admission:  Basic Metabolic Panel:  Lab 02/27/12 1610  NA 141  K 3.2*  CL 105  CO2 --  GLUCOSE 105*  BUN 11  CREATININE 0.80  CALCIUM --  MG --  PHOS --   Liver Function Tests: No results found for this basename: AST:5,ALT:5,ALKPHOS:5,BILITOT:5,PROT:5,ALBUMIN:5 in the last 168 hours No results found for this basename: LIPASE:5,AMYLASE:5 in the last 168 hours No results found for this basename: AMMONIA:5 in the last 168 hours CBC:  Lab 02/27/12 2307 02/27/12 2251  WBC -- 7.6  NEUTROABS -- 6.0  HGB 13.3 13.0  HCT 39.0 38.3  MCV -- 101.3*  PLT -- 124*   Cardiac Enzymes: No results found for this basename: CKTOTAL:5,CKMB:5,CKMBINDEX:5,TROPONINI:5 in the last 168 hours  CBG: No results found for this basename: GLUCAP:5 in the last 168 hours   Radiological Exams on Admission: Dg Chest 2 View  02/27/2012  *RADIOLOGY REPORT*  Clinical Data: Fall  CHEST - 2 VIEW  Comparison: August 16, 2010.  Findings: Cardiomediastinal silhouette appears normal.  No acute pulmonary disease is noted.  Bony thorax appears intact.  IMPRESSION: No acute cardiopulmonary abnormality seen.  Original Report Authenticated By: Venita Sheffield., M.D.   Ct Head Wo Contrast  02/27/2012  *RADIOLOGY REPORT*  Clinical Data: Fall, head laceration.  CT HEAD WITHOUT CONTRAST,CT CERVICAL SPINE WITHOUT CONTRAST  Technique:  Contiguous axial images were obtained from the base of the skull through the vertex without contrast.,Technique: Multidetector CT imaging of the cervical spine was performed. Multiplanar CT image reconstructions were also generated.  Comparison: 08/16/2010 MRI and CT.  Findings:  Head:  Right occipital lobe encephalomalacia is again noted. There has been interval development of curvilinear areas of high attenuation intermixed with the encephalomalacia and along the convexity of the occipital craniotomy, concerning for extra axial hemorrhage. No  significant mass effect.  Otherwise, maintained gray-white differentiation.  No definite intraparenchymal hemorrhage or mass.  No definite CT evidence of acute infarction. No overt hydrocephalus. The craniotomy defect appears similar to priors. There is a right occipital paramidline nondisplaced fracture just below the level the craniotomy, which extends to midline as the fracture line extends inferiorly. There is associated overlying soft tissue swelling Mild opacification of the left mastoid air cells.  Otherwise, the visualized paranasal sinuses and mastoid air cells are predominately clear.  Cervical spine: Lung apices are predominately clear.  Nonspecific nodule measuring 1.1 cm arising from the right lobe of the thyroid gland.  Maintained craniocervical relationship.  No dens fracture. Multilevel degenerative changes, most pronounced at C5-6 and C6-7. No paravertebral soft tissue abnormality.  No aggressive osseous lesion.  IMPRESSION: Mildly displaced right occipital bone fracture.  Interval development of high attenuation within the right occipital lobe encephalomalacia is concerning for extra axial hemorrhage.  No significant mass effect at this time.  Partially opacified left mastoid air cells.  Correlate clinically concerned  for traumatic effusion or mastoiditis.  Multilevel cervical spine degenerative changes.  No acute fracture or dislocation identified.  Critical Value/emergent results were called by telephone at the time of interpretation on 02/27/2012 at 11:00 p.m. to Dr. Freida Busman, who verbally acknowledged these results.  Original Report Authenticated By: Waneta Martins, M.D.   Ct Cervical Spine Wo Contrast  02/27/2012  *RADIOLOGY REPORT*  Clinical Data: Fall, head laceration.  CT HEAD WITHOUT CONTRAST,CT CERVICAL SPINE WITHOUT CONTRAST  Technique:  Contiguous axial images were obtained from the base of the skull through the vertex without contrast.,Technique: Multidetector CT imaging of the  cervical spine was performed. Multiplanar CT image reconstructions were also generated.  Comparison: 08/16/2010 MRI and CT.  Findings:  Head:  Right occipital lobe encephalomalacia is again noted. There has been interval development of curvilinear areas of high attenuation intermixed with the encephalomalacia and along the convexity of the occipital craniotomy, concerning for extra axial hemorrhage. No significant mass effect.  Otherwise, maintained gray-white differentiation.  No definite intraparenchymal hemorrhage or mass.  No definite CT evidence of acute infarction. No overt hydrocephalus. The craniotomy defect appears similar to priors. There is a right occipital paramidline nondisplaced fracture just below the level the craniotomy, which extends to midline as the fracture line extends inferiorly. There is associated overlying soft tissue swelling Mild opacification of the left mastoid air cells.  Otherwise, the visualized paranasal sinuses and mastoid air cells are predominately clear.  Cervical spine: Lung apices are predominately clear.  Nonspecific nodule measuring 1.1 cm arising from the right lobe of the thyroid gland.  Maintained craniocervical relationship.  No dens fracture. Multilevel degenerative changes, most pronounced at C5-6 and C6-7. No paravertebral soft tissue abnormality.  No aggressive osseous lesion.  IMPRESSION: Mildly displaced right occipital bone fracture.  Interval development of high attenuation within the right occipital lobe encephalomalacia is concerning for extra axial hemorrhage.  No significant mass effect at this time.  Partially opacified left mastoid air cells.  Correlate clinically concerned for traumatic effusion or mastoiditis.  Multilevel cervical spine degenerative changes.  No acute fracture or dislocation identified.  Critical Value/emergent results were called by telephone at the time of interpretation on 02/27/2012 at 11:00 p.m. to Dr. Freida Busman, who verbally  acknowledged these results.  Original Report Authenticated By: Waneta Martins, M.D.    EKG: Independently reviewed. SR with no acute ST-T changes.  OK intervals.  QTc only slightly prolonged   Assessment/Plan Present on Admission:  .Skull fracture .Dilantin toxicity .Seizure disorder .Intracranial bleed   PLAN:  Will admit her to SDU for closer observation.  The dilantin level will come down rather slowly.  She will remain ataxic while it is high.  As per neuroSx, no intervention is required, and neurosx will follow.  I have put in a repeat head CT in 24 hours, just for follow up.  With respect to the Dilantin toxicity, no charcoal is given and no dialysis is indicated.  I will continue the Vimpat.  Her EKG is OK, and she will be on telemetry monitoring for her Dilantin toxicity.  She is at risk for developping seizure with this toxicity as well.  She is given IVF gently and made NPO until she is more alert.  She is stable, full code and will be admitted to [facility] service.    Other plans as per orders.  Code Status: FULL.   [name] Siren, MD. [facility] Pager [phone] 7pm to 7am.  02/28/2012, 1:27 AM

## 2012-02-28 NOTE — ED Notes (Signed)
Patient bed change per Dr Conley Rolls from [facility] to [facility].

## 2012-02-29 ENCOUNTER — Inpatient Hospital Stay (HOSPITAL_COMMUNITY): Payer: Medicare Other

## 2012-02-29 LAB — COMPREHENSIVE METABOLIC PANEL
ALT: 17 U/L (ref 0–35)
AST: 23 U/L (ref 0–37)
CO2: 27 mEq/L (ref 19–32)
Calcium: 8.7 mg/dL (ref 8.4–10.5)
Chloride: 103 mEq/L (ref 96–112)
GFR calc Af Amer: >90 mL/min (ref >90)
GFR calc non Af Amer: >90 mL/min (ref >90)
Glucose, Bld: 143 mg/dL — ABNORMAL HIGH (ref 70–99)
Sodium: 138 mEq/L (ref 135–145)
Total Bilirubin: 0.4 mg/dL (ref 0.3–1.2)

## 2012-02-29 LAB — PHENYTOIN LEVEL, TOTAL: Phenytoin Lvl: 20.9 ug/mL — ABNORMAL HIGH (ref 10.0–20.0)

## 2012-02-29 MED ORDER — SODIUM CHLORIDE 0.9 % IV SOLN
INTRAVENOUS | Status: DC
  Administered 2012-02-29 – 2012-03-01 (×2): 1000 mL via INTRAVENOUS

## 2012-02-29 NOTE — Progress Notes (Signed)
MEDICATION RELATED CONSULT NOTE - INITIAL   Pharmacy Consult for phenytoin Indication: hx seizures  Allergies  Allergen Reactions  . Codeine Nausea And Vomiting  . Keppra (Levetiracetam)     Causes seizures.     Patient Measurements: Height: 5\' 7"  (170.2 cm) Weight: 125 lb (56.7 kg) IBW/kg (Calculated) : 61.6  Adjusted Body Weight: 56.7 kg  Vital Signs: Temp: 98.7 F (37.1 C) (07/28 0330) Temp src: Oral (07/28 0330) BP: 128/71 mmHg (07/28 0430) Pulse Rate: 69  (07/28 0500) Intake/Output from previous day: 07/27 0701 - 07/28 0700 In: 1854 [P.O.:50; I.V.:1800; IV Piggyback:4] Out: 1650 [Urine:1550; Emesis/NG output:100] Intake/Output from this shift:    Labs:  Basename 02/29/12 0435 02/28/12 0455 02/27/12 2307 02/27/12 2251  WBC -- 5.1 -- 7.6  HGB -- 12.1 13.3 13.0  HCT -- 36.2 39.0 38.3  PLT -- 125* -- 124*  APTT -- -- -- --  CREATININE 0.50 0.62 0.80 --  LABCREA -- -- -- --  CREATININE 0.50 0.62 0.80 --  CREAT24HRUR -- -- -- --  MG -- -- -- --  PHOS -- -- -- --  ALBUMIN 3.5 3.4* -- --  PROT 6.1 5.7* -- --  ALBUMIN 3.5 3.4* -- --  AST 23 31 -- --  ALT 17 21 -- --  ALKPHOS 55 52 -- --  BILITOT 0.4 0.3 -- --  BILIDIR -- <0.1 -- --  IBILI -- NOT CALCULATED -- --   Estimated Creatinine Clearance: 72 ml/min (by C-G formula based on Cr of 0.5).   Microbiology: Recent Results (from the past 720 hour(s))  MRSA PCR SCREENING     Status: Normal   Collection Time   02/28/12  2:03 AM      Component Value Range Status Comment   MRSA by PCR NEGATIVE  NEGATIVE Final     Medical History: Past Medical History  Diagnosis Date  . Seizures   . DVT (deep venous thrombosis)   . Asthma   . Cerebral hemorrhage     Medications:  Scheduled:    . antiseptic oral rinse  15 mL Mouth Rinse BID  . lacosamide  100 mg Oral BID  . DISCONTD: sodium chloride  3 mL Intravenous Q12H    Assessment: 56 yo female with hx of seizures admitted with fall/skull fx due to  phenytoin toxicity, ataxia and dizziness.  Home dose of phenytoin was 200 mg po TID.  Initial level 38.8; corrected to 49.7).  Phenytoin level this AM = 20.9; corrects to 26.1.  Still above therapeutic range.  Patient also taking home dose of Vimpat.  Goal of Therapy:  Phenytoin level 10-20  Plan:  Will check phenytoin level in AM.  Anticipate that she'll be back in therapeutic range at this time, and likely will resume dosing tomorrow.  Would resume somewhere around 100 -125 mg TID.  Gardner Candle 02/29/2012,8:07 AM

## 2012-02-29 NOTE — Progress Notes (Signed)
Subjective: Complaining of headache and nausea. No seizure activity reported. Patient is slightly less dizzy.  Objective: Current vital signs: BP 114/58  Pulse 67  Temp 98.2 F (36.8 C) (Oral)  Resp 20  Ht 5\' 7"  (1.702 m)  Wt 56.7 kg (125 lb)  BMI 19.58 kg/m2  SpO2 98%  Neurologic Exam: Lethargic but easy to arouse. Well-oriented to person time and place. Somewhat depressed affect. Speech was normal. No facial weakness. Moves extremities equally with normal strength.  Lab Results: Dilantin level was 20.9, corrected to 26.1.  Studies/Results: Dg Chest 2 View  02/27/2012  *RADIOLOGY REPORT*  Clinical Data: Fall  CHEST - 2 VIEW  Comparison: August 16, 2010.  Findings: Cardiomediastinal silhouette appears normal.  No acute pulmonary disease is noted.  Bony thorax appears intact.  IMPRESSION: No acute cardiopulmonary abnormality seen.  Original Report Authenticated By: Venita Sheffield., M.D.   Ct Head Wo Contrast  02/29/2012  *RADIOLOGY REPORT*  Clinical Data: Follow-up intracranial hemorrhage.  CT HEAD WITHOUT CONTRAST  Technique:  Contiguous axial images were obtained from the base of the skull through the vertex without contrast.  Comparison: Yesterday  Findings: Hemorrhage layering in the left occipital horn has nearly resolved.  Hemorrhage layering in the encephalomalacia cavity of the right occipital lobe has markedly improved.  Right parietooccipital craniotomy fragment is slightly depressed and stable.  No new hemorrhage.  No mass effect or midline shift. Ventricles system is stable in size.  No new area of low density to suggest acute ischemic change.  IMPRESSION: Improved hemorrhage within the left occipital horn of the left lateral ventricle and in the right occipital encephalomalacia cavity.  No developing hydrocephalus.  Original Report Authenticated By: Donavan Burnet, M.D.   Ct Head Wo Contrast  02/28/2012  *RADIOLOGY REPORT*  Clinical Data: Fall.  Follow-up intracranial  hemorrhage.  CT HEAD WITHOUT CONTRAST  Technique:  Contiguous axial images were obtained from the base of the skull through the vertex without contrast.  Comparison: 02/27/2012  Findings: Prior   right parietal craniotomy.  Craniotomy flap is depressed but unchanged from earlier studies.  There is encephalomalacia in the right occipital parietal lobe. This has been present previously.  There is now blood layering in this encephalomalacia cavity which communicates with the lateral ventricles.  There is also some blood in the left occipital horn. Previously there was some blood in the cavity which has now layered posteriorly.  It is difficult determine if there has been any further hemorrhage due to the dependent settling of blood since yesterday's study.  Negative for hydrocephalus.  No acute infarct or mass. Nondisplaced right occipital fracture again noted.  IMPRESSION: High density hemorrhage is now layering in the postsurgical cavity in the right occipital parietal lobe.  There is some blood also in the left occipital horn.  No hydrocephalus.  Original Report Authenticated By: Camelia Phenes, M.D.   Ct Head Wo Contrast  02/27/2012  *RADIOLOGY REPORT*  Clinical Data: Fall, head laceration.  CT HEAD WITHOUT CONTRAST,CT CERVICAL SPINE WITHOUT CONTRAST  Technique:  Contiguous axial images were obtained from the base of the skull through the vertex without contrast.,Technique: Multidetector CT imaging of the cervical spine was performed. Multiplanar CT image reconstructions were also generated.  Comparison: 08/16/2010 MRI and CT.  Findings:  Head:  Right occipital lobe encephalomalacia is again noted. There has been interval development of curvilinear areas of high attenuation intermixed with the encephalomalacia and along the convexity of the occipital craniotomy, concerning for  extra axial hemorrhage. No significant mass effect.  Otherwise, maintained gray-white differentiation.  No definite intraparenchymal  hemorrhage or mass.  No definite CT evidence of acute infarction. No overt hydrocephalus. The craniotomy defect appears similar to priors. There is a right occipital paramidline nondisplaced fracture just below the level the craniotomy, which extends to midline as the fracture line extends inferiorly. There is associated overlying soft tissue swelling Mild opacification of the left mastoid air cells.  Otherwise, the visualized paranasal sinuses and mastoid air cells are predominately clear.  Cervical spine: Lung apices are predominately clear.  Nonspecific nodule measuring 1.1 cm arising from the right lobe of the thyroid gland.  Maintained craniocervical relationship.  No dens fracture. Multilevel degenerative changes, most pronounced at C5-6 and C6-7. No paravertebral soft tissue abnormality.  No aggressive osseous lesion.  IMPRESSION: Mildly displaced right occipital bone fracture.  Interval development of high attenuation within the right occipital lobe encephalomalacia is concerning for extra axial hemorrhage.  No significant mass effect at this time.  Partially opacified left mastoid air cells.  Correlate clinically concerned for traumatic effusion or mastoiditis.  Multilevel cervical spine degenerative changes.  No acute fracture or dislocation identified.  Critical Value/emergent results were called by telephone at the time of interpretation on 02/27/2012 at 11:00 p.m. to Dr. Freida Busman, who verbally acknowledged these results.  Original Report Authenticated By: Waneta Martins, M.D.   Ct Cervical Spine Wo Contrast  02/27/2012  *RADIOLOGY REPORT*  Clinical Data: Fall, head laceration.  CT HEAD WITHOUT CONTRAST,CT CERVICAL SPINE WITHOUT CONTRAST  Technique:  Contiguous axial images were obtained from the base of the skull through the vertex without contrast.,Technique: Multidetector CT imaging of the cervical spine was performed. Multiplanar CT image reconstructions were also generated.  Comparison:  08/16/2010 MRI and CT.  Findings:  Head:  Right occipital lobe encephalomalacia is again noted. There has been interval development of curvilinear areas of high attenuation intermixed with the encephalomalacia and along the convexity of the occipital craniotomy, concerning for extra axial hemorrhage. No significant mass effect.  Otherwise, maintained gray-white differentiation.  No definite intraparenchymal hemorrhage or mass.  No definite CT evidence of acute infarction. No overt hydrocephalus. The craniotomy defect appears similar to priors. There is a right occipital paramidline nondisplaced fracture just below the level the craniotomy, which extends to midline as the fracture line extends inferiorly. There is associated overlying soft tissue swelling Mild opacification of the left mastoid air cells.  Otherwise, the visualized paranasal sinuses and mastoid air cells are predominately clear.  Cervical spine: Lung apices are predominately clear.  Nonspecific nodule measuring 1.1 cm arising from the right lobe of the thyroid gland.  Maintained craniocervical relationship.  No dens fracture. Multilevel degenerative changes, most pronounced at C5-6 and C6-7. No paravertebral soft tissue abnormality.  No aggressive osseous lesion.  IMPRESSION: Mildly displaced right occipital bone fracture.  Interval development of high attenuation within the right occipital lobe encephalomalacia is concerning for extra axial hemorrhage.  No significant mass effect at this time.  Partially opacified left mastoid air cells.  Correlate clinically concerned for traumatic effusion or mastoiditis.  Multilevel cervical spine degenerative changes.  No acute fracture or dislocation identified.  Critical Value/emergent results were called by telephone at the time of interpretation on 02/27/2012 at 11:00 p.m. to Dr. Freida Busman, who verbally acknowledged these results.  Original Report Authenticated By: Waneta Martins, M.D.    Medications:    Scheduled:   . antiseptic oral rinse  15 mL Mouth Rinse  BID  . lacosamide  100 mg Oral BID  . DISCONTD: sodium chloride  3 mL Intravenous Q12H   ZOX:[id number], HYDROmorphone (DILAUDID) injection, ondansetron (ZOFRAN) IV, ondansetron, oxyCODONE, promethazine, DISCONTD:  HYDROmorphone (DILAUDID) injection, DISCONTD: ondansetron (ZOFRAN) IV, DISCONTD: ondansetron  Assessment/Plan: Closed head injury with probable mild concussion in the setting of unstable gait with Dilantin toxicity. Symptoms of dizziness have improved. Patient is still complaining of headache and nausea. At least mild concussion is suspected. CT scan of the head showed an improvement in posterior intracranial hemorrhage. Slightly depressed occipital fracture is unchanged.  Recommendations: 1. We'll continue management of Dilantin her Pharmacy service. Dilantin we'll continue to be held until Dilantin level is within normal therapeutic range. 2. No change in current dose of Vimpat 100 mg twice a day. 3. Continue symptomatic treatment of headache and nausea. 4. Recommend physical therapy evaluation of patient's gait.  C.R. Roseanne Reno, MD [facility] [phone]  02/29/2012  8:50 AM

## 2012-02-29 NOTE — Progress Notes (Signed)
[facility] Progress Note [place] TEAM 1 - Stepdown/ICU TEAM   STASIA SOMERO ZOX:[id number] DOB: Jun 21, 1956 DOA: 02/27/2012 PCP: Pearson Grippe, MD  Brief narrative: 56 y.o. female with hx of seizure, s/p prior craniotomy for intracranial hematoma many years ago, with prior hx of dilantin toxicity, last seizure over 6 months ago, hx of DVT, asthma, who felt ataxic and fell hitting her head. 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 Neurosurgical consult was done and felt that no neurosurgical intervention was indicated.  Assessment/Plan:  Dilantin toxicity Presenting Dilantin level was 38.8 - hydrating - levels are slowly correcting - neurology and pharmacy are directing further dosing  Skull fracture status post fall A repeat CT scan of the head was accomplished emergently yesterday as the patient suffered an alteration in her mental status with significant increased complaints of nausea - fortunately a followup CT scan at that time did not reveal any significant changes - a second followup CT scan was accomplished today and in fact has begun to show improvement - neurosurgery continues to follow with [place]  Intracranial hemorrhage due to above As per discussion above - CT of the head suggests improvement  Seizure disorder Neurology is following and the attending to her medication dosing  History of DVT Clearly cannot use pharmacologic prophylaxis at this time - continue SCDs  Asthma Well compensated at present  Code Status: Full Disposition Plan: Observe on step down unit  Consultants: Neurosurgery 7/26 Neurology 7/27  Procedures: None  Antibiotics: None  HPI/Subjective: The patient is much more alert today.  She continues to complain of nausea and a frontal headache.  She denies fevers chills shortness of breath abdominal pain chest pain or change in visual acuity.   Objective: Blood pressure  114/58, pulse 67, temperature 98.2 F (36.8 C), temperature source Oral, resp. rate 20, height 5\' 7"  (1.702 m), weight 56.7 kg (125 lb), SpO2 98.00%.  Intake/Output Summary (Last 24 hours) at 02/29/12 1120 Last data filed at 02/29/12 1000  Gross per 24 hour  Intake   1779 ml  Output   1650 ml  Net    129 ml     Exam: General: No acute respiratory distress Lungs: Clear to auscultation bilaterally without wheezes or crackles Cardiovascular: Regular rate and rhythm without murmur gallop or rub normal S1 and S2 Abdomen: Nontender, nondistended, soft, bowel sounds positive, no rebound, no ascites, no appreciable mass Extremities: No significant cyanosis, clubbing, or edema bilateral lower extremities Neuro:  Alert and oriented x4, moves all 4 extremities spontaneously, cranial nerves II through XII intact bilaterally  Data Reviewed: Basic Metabolic Panel:  Lab 02/29/12 8119 02/28/12 0455 02/27/12 2307  NA 138 139 141  K 4.1 4.2 3.2*  CL 103 104 105  CO2 27 26 --  GLUCOSE 143* 127* 105*  BUN 5* 10 11  CREATININE 0.50 0.62 0.80  CALCIUM 8.7 8.6 --  MG -- -- --  PHOS -- -- --   Liver Function Tests:  Lab 02/29/12 0435 02/28/12 0455  AST 23 31  ALT 17 21  ALKPHOS 55 52  BILITOT 0.4 0.3  PROT 6.1 5.7*  ALBUMIN 3.5 3.4*   CBC:  Lab 02/28/12 0455 02/27/12 2307 02/27/12 2251  WBC 5.1 -- 7.6  NEUTROABS -- -- 6.0  HGB 12.1 13.3 13.0  HCT 36.2 39.0 38.3  MCV 102.3* -- 101.3*  PLT 125* -- 124*   Recent Results (from the past 240 hour(s))  MRSA PCR  SCREENING     Status: Normal   Collection Time   02/28/12  2:03 AM      Component Value Range Status Comment   MRSA by PCR NEGATIVE  NEGATIVE Final     Studies:  Recent x-ray studies have been reviewed in detail by the Attending Physician  Scheduled Meds:  Reviewed in detail by the Attending Physician   Lonia Blood, MD [facility] Office  [phone] Pager [phone]  On-Call/Text Page:       [email or web address]      password [email or web address]  If 7PM-7AM, please contact night-coverage [email or web address] Password [email or web address] 02/29/2012, 11:20 AM   LOS: 2 days

## 2012-02-29 NOTE — Progress Notes (Signed)
Subjective: Patient resting in bed, complaining of right hemicranium headache, has also been having difficulties with nausea and some vomiting. CT the brain repeated this morning, shows small amount of blood in encephalomalacia cavity, without mass effect or shift. Dilantin level 20.9 this morning. Being followed now by neurology regarding seizure disorder and anticonvulsant therapy.  Objective: Vital signs in last 24 hours: Filed Vitals:   02/29/12 0400 02/29/12 0430 02/29/12 0500 02/29/12 0806  BP: 122/51 128/71  114/58  Pulse: 87 84 69 67  Temp:    98.2 F (36.8 C)  TempSrc:    Oral  Resp: 19 20 19 20   Height:      Weight:      SpO2: 100% 100%  98%    Intake/Output from previous day: 07/27 0701 - 07/28 0700 In: 1854 [P.O.:50; I.V.:1800; IV Piggyback:4] Out: 1650 [Urine:1550; Emesis/NG output:100] Intake/Output this shift:    Physical Exam:  Awake and alert, oriented to name, [facility] hospital, and July 2013. Following commands. Speech fluent. Pupils equal round and reactive to light. Extraocular movements intact. Facial movements symmetrical. Full strength. No drift of upper extremities.  CBC  Basename 02/28/12 0455 02/27/12 2307 02/27/12 2251  WBC 5.1 -- 7.6  HGB 12.1 13.3 --  HCT 36.2 39.0 --  PLT 125* -- 124*   BMET  Basename 02/29/12 0435 02/28/12 0455  NA 138 139  K 4.1 4.2  CL 103 104  CO2 27 26  GLUCOSE 143* 127*  BUN 5* 10  CREATININE 0.50 0.62  CALCIUM 8.7 8.6    Studies/Results: Dg Chest 2 View  02/27/2012  *RADIOLOGY REPORT*  Clinical Data: Fall  CHEST - 2 VIEW  Comparison: August 16, 2010.  Findings: Cardiomediastinal silhouette appears normal.  No acute pulmonary disease is noted.  Bony thorax appears intact.  IMPRESSION: No acute cardiopulmonary abnormality seen.  Original Report Authenticated By: Venita Sheffield., M.D.   Ct Head Wo Contrast  02/29/2012  *RADIOLOGY REPORT*  Clinical Data: Follow-up intracranial hemorrhage.  CT HEAD WITHOUT  CONTRAST  Technique:  Contiguous axial images were obtained from the base of the skull through the vertex without contrast.  Comparison: Yesterday  Findings: Hemorrhage layering in the left occipital horn has nearly resolved.  Hemorrhage layering in the encephalomalacia cavity of the right occipital lobe has markedly improved.  Right parietooccipital craniotomy fragment is slightly depressed and stable.  No new hemorrhage.  No mass effect or midline shift. Ventricles system is stable in size.  No new area of low density to suggest acute ischemic change.  IMPRESSION: Improved hemorrhage within the left occipital horn of the left lateral ventricle and in the right occipital encephalomalacia cavity.  No developing hydrocephalus.  Original Report Authenticated By: Donavan Burnet, M.D.   Ct Head Wo Contrast  02/28/2012  *RADIOLOGY REPORT*  Clinical Data: Fall.  Follow-up intracranial hemorrhage.  CT HEAD WITHOUT CONTRAST  Technique:  Contiguous axial images were obtained from the base of the skull through the vertex without contrast.  Comparison: 02/27/2012  Findings: Prior   right parietal craniotomy.  Craniotomy flap is depressed but unchanged from earlier studies.  There is encephalomalacia in the right occipital parietal lobe. This has been present previously.  There is now blood layering in this encephalomalacia cavity which communicates with the lateral ventricles.  There is also some blood in the left occipital horn. Previously there was some blood in the cavity which has now layered posteriorly.  It is difficult determine if there has been  any further hemorrhage due to the dependent settling of blood since yesterday's study.  Negative for hydrocephalus.  No acute infarct or mass. Nondisplaced right occipital fracture again noted.  IMPRESSION: High density hemorrhage is now layering in the postsurgical cavity in the right occipital parietal lobe.  There is some blood also in the left occipital horn.  No  hydrocephalus.  Original Report Authenticated By: Camelia Phenes, M.D.   Ct Head Wo Contrast  02/27/2012  *RADIOLOGY REPORT*  Clinical Data: Fall, head laceration.  CT HEAD WITHOUT CONTRAST,CT CERVICAL SPINE WITHOUT CONTRAST  Technique:  Contiguous axial images were obtained from the base of the skull through the vertex without contrast.,Technique: Multidetector CT imaging of the cervical spine was performed. Multiplanar CT image reconstructions were also generated.  Comparison: 08/16/2010 MRI and CT.  Findings:  Head:  Right occipital lobe encephalomalacia is again noted. There has been interval development of curvilinear areas of high attenuation intermixed with the encephalomalacia and along the convexity of the occipital craniotomy, concerning for extra axial hemorrhage. No significant mass effect.  Otherwise, maintained gray-white differentiation.  No definite intraparenchymal hemorrhage or mass.  No definite CT evidence of acute infarction. No overt hydrocephalus. The craniotomy defect appears similar to priors. There is a right occipital paramidline nondisplaced fracture just below the level the craniotomy, which extends to midline as the fracture line extends inferiorly. There is associated overlying soft tissue swelling Mild opacification of the left mastoid air cells.  Otherwise, the visualized paranasal sinuses and mastoid air cells are predominately clear.  Cervical spine: Lung apices are predominately clear.  Nonspecific nodule measuring 1.1 cm arising from the right lobe of the thyroid gland.  Maintained craniocervical relationship.  No dens fracture. Multilevel degenerative changes, most pronounced at C5-6 and C6-7. No paravertebral soft tissue abnormality.  No aggressive osseous lesion.  IMPRESSION: Mildly displaced right occipital bone fracture.  Interval development of high attenuation within the right occipital lobe encephalomalacia is concerning for extra axial hemorrhage.  No significant mass  effect at this time.  Partially opacified left mastoid air cells.  Correlate clinically concerned for traumatic effusion or mastoiditis.  Multilevel cervical spine degenerative changes.  No acute fracture or dislocation identified.  Critical Value/emergent results were called by telephone at the time of interpretation on 02/27/2012 at 11:00 p.m. to Dr. Freida Busman, who verbally acknowledged these results.  Original Report Authenticated By: Waneta Martins, M.D.   Ct Cervical Spine Wo Contrast  02/27/2012  *RADIOLOGY REPORT*  Clinical Data: Fall, head laceration.  CT HEAD WITHOUT CONTRAST,CT CERVICAL SPINE WITHOUT CONTRAST  Technique:  Contiguous axial images were obtained from the base of the skull through the vertex without contrast.,Technique: Multidetector CT imaging of the cervical spine was performed. Multiplanar CT image reconstructions were also generated.  Comparison: 08/16/2010 MRI and CT.  Findings:  Head:  Right occipital lobe encephalomalacia is again noted. There has been interval development of curvilinear areas of high attenuation intermixed with the encephalomalacia and along the convexity of the occipital craniotomy, concerning for extra axial hemorrhage. No significant mass effect.  Otherwise, maintained gray-white differentiation.  No definite intraparenchymal hemorrhage or mass.  No definite CT evidence of acute infarction. No overt hydrocephalus. The craniotomy defect appears similar to priors. There is a right occipital paramidline nondisplaced fracture just below the level the craniotomy, which extends to midline as the fracture line extends inferiorly. There is associated overlying soft tissue swelling Mild opacification of the left mastoid air cells.  Otherwise, the visualized paranasal sinuses  and mastoid air cells are predominately clear.  Cervical spine: Lung apices are predominately clear.  Nonspecific nodule measuring 1.1 cm arising from the right lobe of the thyroid gland.  Maintained  craniocervical relationship.  No dens fracture. Multilevel degenerative changes, most pronounced at C5-6 and C6-7. No paravertebral soft tissue abnormality.  No aggressive osseous lesion.  IMPRESSION: Mildly displaced right occipital bone fracture.  Interval development of high attenuation within the right occipital lobe encephalomalacia is concerning for extra axial hemorrhage.  No significant mass effect at this time.  Partially opacified left mastoid air cells.  Correlate clinically concerned for traumatic effusion or mastoiditis.  Multilevel cervical spine degenerative changes.  No acute fracture or dislocation identified.  Critical Value/emergent results were called by telephone at the time of interpretation on 02/27/2012 at 11:00 p.m. to Dr. Freida Busman, who verbally acknowledged these results.  Original Report Authenticated By: Waneta Martins, M.D.    Assessment/Plan: No indication for neurosurgical intervention at this time. She will require continued supportive care. I will be out of town for the next week and a half, and I will have Dr. Hilda Lias, who previously treated this patient in 1989, follow her from a neurosurgical perspective.   Hewitt Shorts, MD 02/29/2012, 10:26 AM

## 2012-03-01 MED ORDER — PHENYTOIN SODIUM EXTENDED 100 MG PO CAPS
300.0000 mg | ORAL_CAPSULE | Freq: Every day | ORAL | Status: DC
  Administered 2012-03-01 – 2012-03-04 (×4): 300 mg via ORAL
  Filled 2012-03-01 (×6): qty 3

## 2012-03-01 MED ORDER — PHENYTOIN SODIUM EXTENDED 100 MG PO CAPS
200.0000 mg | ORAL_CAPSULE | Freq: Every day | ORAL | Status: DC
  Administered 2012-03-02 – 2012-03-05 (×4): 200 mg via ORAL
  Filled 2012-03-01 (×4): qty 2

## 2012-03-01 MED ORDER — PHENYTOIN SODIUM EXTENDED 100 MG PO CAPS
200.0000 mg | ORAL_CAPSULE | Freq: Two times a day (BID) | ORAL | Status: DC
  Filled 2012-03-01: qty 2

## 2012-03-01 NOTE — Progress Notes (Signed)
[facility] Progress Note [place] TEAM 1 - Stepdown/ICU TEAM   MAELEE HOOT ZOX:[id number] DOB: 01-17-1956 DOA: 02/27/2012 PCP: Pearson Grippe, MD  Brief narrative: 56 y.o. female with hx of seizure, s/p prior craniotomy for intracranial hematoma many years ago, with prior hx of dilantin toxicity, last seizure over 6 months ago, hx of DVT, asthma, who felt ataxic and fell hitting her head. 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 Neurosurgical consult was done and felt that no neurosurgical intervention was indicated.  Assessment/Plan:  Dilantin toxicity Presenting Dilantin level was 38.8 - hydrating - levels are now within a therapeutic range - neurology and pharmacy are directing further dosing  Skull fracture status post fall A repeat CT scan of the head was accomplished emergently 7/27 as the patient suffered an alteration in her mental status with significant increased complaints of nausea - fortunately that CT scan did not reveal any significant changes - a second followup CT scan was accomplished 7/28 and in fact had begun to show improvement - Neurosurgery continues to follow with [place] - will recheck a final CT head in AM to assure continues to improve  Intracranial hemorrhage due to above As per discussion above - CT of the head suggests improvement  Seizure disorder Neurology is following and the attending to her medication dosing  Hyperglycemia Noted on serial BMETs - check hemoglobin A1c  History of DVT Clearly cannot use pharmacologic prophylaxis at this time - continue SCDs - push ambulation - no physical exam findings to raise concern of recurrence  Asthma Well compensated at present  Code Status: Full Disposition Plan: Stable for transfer to neuro unit   Consultants: Neurosurgery 7/26 Neurology 7/27  Procedures: None  Antibiotics: None  HPI/Subjective: The patient is alert and  conversant but does continue to complain of headache and low-grade nausea.  She is not vomiting.  She denies fevers chills shortness of breath or chest pain.  She continues to report lateral chest wall pain.   Objective: Blood pressure 128/83, pulse 80, temperature 98.8 F (37.1 C), temperature source Oral, resp. rate 24, height 5\' 7"  (1.702 m), weight 58.1 kg (128 lb 1.4 oz), SpO2 96.00%.  Intake/Output Summary (Last 24 hours) at 03/01/12 1457 Last data filed at 03/01/12 1121  Gross per 24 hour  Intake   1042 ml  Output   2350 ml  Net  -1308 ml     Exam: General: No acute respiratory distress Lungs: Clear to auscultation bilaterally without wheezes or crackles - limited inspiration due to pain  Cardiovascular: Regular rate and rhythm without murmur gallop or rub  Abdomen: Nontender, nondistended, soft, bowel sounds positive, no rebound, no ascites, no appreciable mass Extremities: No significant cyanosis, clubbing, or edema bilateral lower extremities Neuro:  Alert and oriented x4, moves all 4 extremities spontaneously, cranial nerves II through XII intact bilaterally  Data Reviewed: Basic Metabolic Panel:  Lab 02/29/12 8119 02/28/12 0455 02/27/12 2307  NA 138 139 141  K 4.1 4.2 3.2*  CL 103 104 105  CO2 27 26 --  GLUCOSE 143* 127* 105*  BUN 5* 10 11  CREATININE 0.50 0.62 0.80  CALCIUM 8.7 8.6 --  MG -- -- --  PHOS -- -- --   Liver Function Tests:  Lab 02/29/12 0435 02/28/12 0455  AST 23 31  ALT 17 21  ALKPHOS 55 52  BILITOT 0.4 0.3  PROT 6.1 5.7*  ALBUMIN 3.5 3.4*   CBC:  Lab  02/28/12 0455 02/27/12 2307 02/27/12 2251  WBC 5.1 -- 7.6  NEUTROABS -- -- 6.0  HGB 12.1 13.3 13.0  HCT 36.2 39.0 38.3  MCV 102.3* -- 101.3*  PLT 125* -- 124*   Recent Results (from the past 240 hour(s))  MRSA PCR SCREENING     Status: Normal   Collection Time   02/28/12  2:03 AM      Component Value Range Status Comment   MRSA by PCR NEGATIVE  NEGATIVE Final      Studies:  Recent x-ray studies have been reviewed in detail by the Attending Physician  Scheduled Meds:  Reviewed in detail by the Attending Physician   Lonia Blood, MD [facility] Office  [phone] Pager [phone]  On-Call/Text Page:      [email or web address]      password [email or web address]  If 7PM-7AM, please contact night-coverage [email or web address] Password [email or web address] 03/01/2012, 2:57 PM   LOS: 3 days

## 2012-03-01 NOTE — ED Provider Notes (Signed)
Medical screening examination/treatment/procedure(s) were conducted as a shared visit with non-physician practitioner(s) and myself.  I personally evaluated the patient during the encounter  Toy Baker, MD 03/01/12 2356

## 2012-03-01 NOTE — Progress Notes (Signed)
Utilization Review Completed.  [name]  03/01/2012  

## 2012-03-01 NOTE — Progress Notes (Signed)
Patient ID: [name], female   DOB: 1956/01/20, 56 y.o.   MRN: [id number] Stable. Decrease of headache. Vs, wnl

## 2012-03-01 NOTE — Progress Notes (Addendum)
MEDICATION RELATED CONSULT NOTE - Follow up  Pharmacy Consult for phenytoin Indication: hx seizures  Labs:  [facility] 02/29/12 0435 02/28/12 0455 02/27/12 2307 02/27/12 2251  WBC -- 5.1 -- 7.6  HGB -- 12.1 13.3 13.0  HCT -- 36.2 39.0 38.3  PLT -- 125* -- 124*  APTT -- -- -- --  CREATININE 0.50 0.62 0.80 --  LABCREA -- -- -- --  CREATININE 0.50 0.62 0.80 --  [id number] -- -- -- --  MG -- -- -- --  PHOS -- -- -- --  ALBUMIN 3.5 3.4* -- --  PROT 6.1 5.7* -- --  ALBUMIN 3.5 3.4* -- --  AST 23 31 -- --  ALT 17 21 -- --  ALKPHOS 55 52 -- --  BILITOT 0.4 0.3 -- --  BILIDIR -- <0.1 -- --  IBILI -- NOT CALCULATED -- --   Estimated Creatinine Clearance: 73.7 ml/min (by C-G formula based on Cr of 0.5).  Phenytoin level 15.7, corrected level 18.75  Medications:  Scheduled:     . antiseptic oral rinse  15 mL Mouth Rinse BID  . lacosamide  100 mg Oral BID    Assessment: 56 yo female with hx of seizures admitted with fall/skull fx due to phenytoin toxicity, ataxia and dizziness.  Home dose of phenytoin was 200 mg po TID.  Initial level 38.8; (corrected to 49.7).  Phenytoin level this AM = 15.7; corrects to 18.75.  This is on the high end of the therapeutic range.  Patient also taking home dose of Vimpat.  Goal of Therapy:  Phenytoin level 10-20  Plan:  Resume phenytoin this evening.  Will try 200mg  po qAM and 300mg  qPM and check a level in 5 to 7 days to verify dose.  Mickeal Skinner 03/01/2012,7:55 AM

## 2012-03-01 NOTE — Evaluation (Signed)
Physical Therapy Evaluation Patient Details Name: [name] MRN: [id number] DOB: 05-03-1956 Today's Date: 03/01/2012 Time: 4098-1191 PT Time Calculation (min): 23 min  PT Assessment / Plan / Recommendation Clinical Impression  PAtient s/p skull fracture with decr mobility secondary to decr endurance and decr balance.  Will benefit from PT to address balance issues.  May need RW and will need HHPT f/u.      PT Assessment  Patient needs continued PT services    Follow Up Recommendations  Home health PT;Supervision - Intermittent    Barriers to Discharge Decreased caregiver support      Equipment Recommendations  Rolling walker with 5" wheels    Recommendations for Other Services     Frequency Min 3X/week    Precautions / Restrictions Precautions Precautions: Fall Restrictions Weight Bearing Restrictions: No   Pertinent Vitals/Pain VSS, Some pain      Mobility  Bed Mobility Bed Mobility: Rolling Left;Left Sidelying to Sit;Sitting - Scoot to Edge of Bed Rolling Left: 4: Min guard Left Sidelying to Sit: 4: Min guard;HOB elevated Sitting - Scoot to [place] of Bed: 4: Min guard Transfers Transfers: Sit to Stand;Stand to Sit Sit to Stand: 4: Min guard;With upper extremity assist;From bed Stand to Sit: 4: Min guard;With upper extremity assist;To chair/3-in-1;With armrests Details for Transfer Assistance: cues for technique and hand placement Ambulation/Gait Ambulation/Gait Assistance: 4: Min assist Ambulation Distance (Feet): 35 Feet Assistive device: 1 person hand held assist Ambulation/Gait Assistance Details: Patient with continued mild ataxic gait however has not been up in last few days.  Used IV pole and HHA.  Unsteady gait.   Gait Pattern: Wide base of support;Ataxic;Step-through pattern;Decreased stride length Gait velocity: decreased Stairs: No Wheelchair Mobility Wheelchair Mobility: No         PT Diagnosis: Generalized weakness  PT Problem List: Decreased  activity tolerance;Decreased balance;Decreased mobility;Decreased coordination;Decreased safety awareness;Decreased knowledge of precautions;Decreased knowledge of use of DME PT Treatment Interventions: DME instruction;Gait training;Stair training;Functional mobility training;Therapeutic activities;Therapeutic exercise;Patient/family education   PT Goals Acute Rehab PT Goals PT Goal Formulation: With patient Time For Goal Achievement: 03/08/12 Potential to Achieve Goals: Good Pt will go Supine/Side to Sit: Independently PT Goal: Supine/Side to Sit - Progress: Goal set today Pt will go Sit to Stand: Independently PT Goal: Sit to Stand - Progress: Goal set today Pt will Ambulate: 51 - 150 feet;with modified independence;with least restrictive assistive device PT Goal: Ambulate - Progress: Goal set today Pt will Go Up / Down Stairs: 3-5 stairs;with modified independence;with least restrictive assistive device PT Goal: Up/Down Stairs - Progress: Goal set today  Visit Information  Last PT Received On: 03/01/12 Assistance Needed: +1    Subjective Data  Subjective: "I was doing fine until I cleaned my bathroom." Patient Stated Goal: To go home   Prior Functioning  Home Living Lives With:  (children) Available Help at Discharge: Family;Available PRN/intermittently (children work) Type of Home: House Home Access: Stairs to enter [employer] of Steps: 4 in front with no rail and 16 in the back with a rail on the left Home Layout: One level Bathroom Shower/Tub: Forensic psychologist: None Prior Function Level of Independence: Independent Able to Take Stairs?: Yes Driving: No Vocation: On disability Communication Communication: No difficulties Dominant Hand: Right    Cognition  Overall Cognitive Status: Appears within functional limits for tasks assessed/performed Arousal/Alertness: Awake/alert Orientation Level:  Appears intact for tasks assessed Behavior During Session: [facility] for tasks performed  Extremity/Trunk Assessment Right Upper Extremity Assessment RUE ROM/Strength/Tone: WFL for tasks assessed RUE Sensation: WFL - Light Touch RUE Coordination: WFL - gross/fine motor Left Upper Extremity Assessment LUE ROM/Strength/Tone: WFL for tasks assessed LUE Sensation: WFL - Light Touch LUE Coordination: WFL - gross/fine motor Right Lower Extremity Assessment RLE ROM/Strength/Tone: WFL for tasks assessed RLE Sensation: WFL - Light Touch RLE Coordination: WFL - gross/fine motor Left Lower Extremity Assessment LLE ROM/Strength/Tone: WFL for tasks assessed LLE Sensation: WFL - Light Touch LLE Coordination: WFL - gross/fine motor;Deficits LLE Coordination Deficits: some ataxia noted Trunk Assessment Trunk Assessment: Normal (decr coordination of trunk with slight tremor)   Balance Static Standing Balance Static Standing - Balance Support: Right upper extremity supported;During functional activity Static Standing - Level of Assistance: 4: Min assist Static Standing - Comment/# of Minutes: 2  End of Session PT - End of Session Equipment Utilized During Treatment: Gait belt Activity Tolerance: Patient limited by fatigue Patient left: in chair;with call bell/phone within reach Nurse Communication: Mobility status       [name] 03/01/2012, 11:55 AM  [facility] [phone] (pager)

## 2012-03-01 NOTE — Progress Notes (Signed)
Subjective: Continues to experience headaches and nausea. No seizure activity reported. No new complaints.  Objective: Current vital signs: BP 110/53  Pulse 64  Temp 98.9 F (37.2 C) (Oral)  Resp 16  Ht 5\' 7"  (1.702 m)  Wt 58.1 kg (128 lb 1.4 oz)  BMI 20.06 kg/m2  SpO2 97%  Neurologic Exam: Alert and in moderate distress from headache. Oriented x3. Somewhat depressed affect. Normal speech. Moves extremities equally with normal strength.  Lab Results: Dilantin level is 15.7, corrected to 19.3.  Medications:  Scheduled:   . antiseptic oral rinse  15 mL Mouth Rinse BID  . lacosamide  100 mg Oral BID  . phenytoin  200 mg Oral BID   ZOX:[id number], HYDROmorphone (DILAUDID) injection, ondansetron (ZOFRAN) IV, ondansetron, oxyCODONE, promethazine  Assessment/Plan: 1. Dilantin toxicity, resolved. 2. Closed head trauma with occipital fracture and intracranial hemorrhage, resolving. 3. Concussion with continued headache and nausea, posttraumatic.  Recommendations: 1. Continue Dilantin per pharmacy management. 2. Continue Vimpat 100 mg twice a day. 3. Symptomatic management of headaches and nausea. 4. Followup with [facility] following discharge. No further neurological intervention is indicated at this point.  C.R. Roseanne Reno, MD [facility]  03/01/2012  8:21 AM

## 2012-03-01 NOTE — Evaluation (Signed)
Occupational Therapy Evaluation Patient Details Name: [name] MRN: [id number] DOB: 1956/04/25 Today's Date: 03/01/2012 Time: 1308-6578 OT Time Calculation (min): 25 min  OT Assessment / Plan / Recommendation Clinical Impression  Pt admitted after falling sustaining a skull fx and R occipital/parietal hemorrhage.    OT Assessment  Patient needs continued OT Services    Follow Up Recommendations  Home health OT;Supervision/Assistance - 24 hour--initially    Barriers to Discharge      Equipment Recommendations  Rolling walker with 5" wheels    Recommendations for Other Services    Frequency  Min 2X/week    Precautions / Restrictions Precautions Precautions: Fall Restrictions Weight Bearing Restrictions: No   Pertinent Vitals/Pain 7/10 HA, RN aware, premedicated    ADL  Eating/Feeding: Performed;Set up Where Assessed - Eating/Feeding: Chair Grooming: Performed;Wash/dry face;Wash/dry hands;Set up Where Assessed - Grooming: Unsupported sitting Upper Body Bathing: Simulated;Minimal assistance Where Assessed - Upper Body Bathing: Unsupported sitting Lower Body Bathing: Simulated;Minimal assistance Where Assessed - Lower Body Bathing: Unsupported sitting;Supported sit to stand Upper Body Dressing: Simulated;Minimal assistance Where Assessed - Upper Body Dressing: Unsupported sitting Lower Body Dressing: Performed;Minimal assistance Where Assessed - Lower Body Dressing: Supported sit to stand Toilet Transfer: Performed;Minimal assistance Toilet Transfer Method: Surveyor, minerals: Materials engineer and Hygiene: Performed;Min guard Where Assessed - Engineer, mining and Hygiene: Sit to stand from 3-in-1 or toilet Equipment Used: Gait belt Transfers/Ambulation Related to ADLs: min guard for sit to stand, min assist for transfer due to mild decreased balance ADL Comments: Evaluation of skills limited by pt's  pain level.    OT Diagnosis: Generalized weakness;Acute pain  OT Problem List: Decreased activity tolerance;Impaired balance (sitting and/or standing);Impaired vision/perception;Pain;Decreased knowledge of use of DME or AE OT Treatment Interventions: Self-care/ADL training;DME and/or AE instruction;Patient/family education;Balance training   OT Goals Acute Rehab OT Goals OT Goal Formulation: With patient Time For Goal Achievement: 03/08/12 Potential to Achieve Goals: Good ADL Goals Pt Will Perform Grooming: with supervision;Standing at sink ADL Goal: Grooming - Progress: Goal set today Pt Will Perform Upper Body Bathing: with set-up;Sitting, edge of bed ADL Goal: Upper Body Bathing - Progress: Goal set today Pt Will Perform Lower Body Bathing: with set-up;Sit to stand from bed ADL Goal: Lower Body Bathing - Progress: Goal set today Pt Will Perform Upper Body Dressing: with set-up;Sitting, bed ADL Goal: Upper Body Dressing - Progress: Goal set today Pt Will Perform Lower Body Dressing: with set-up;Sit to stand from bed ADL Goal: Lower Body Dressing - Progress: Goal set today Pt Will Transfer to Toilet: with supervision;Regular height toilet;Ambulation ADL Goal: Toilet Transfer - Progress: Goal set today Pt Will Perform Toileting - Clothing Manipulation: with supervision ADL Goal: Toileting - Clothing Manipulation - Progress: Goal set today Miscellaneous OT Goals Miscellaneous OT Goal #1: Pt will demonstrate visual skills necessary for mobility, reading, and performance of ADL. OT Goal: Miscellaneous Goal #1 - Progress: Goal set today  Visit Information  Last OT Received On: 03/01/12 Assistance Needed: +1    Subjective Data  Subjective: "My head hurts." " I don't want to be a bother to anyone." Patient Stated Goal: Return to PLOF.   Prior Functioning  Vision/Perception  Home Living Lives With: Son;Daughter Available Help at Discharge: Family;Available PRN/intermittently Type  of Home: House Home Access: Stairs to enter [employer] of Steps: 4 in front with no rail and 16 in the back with a rail on the left Home Layout: One level Bathroom  Shower/Tub: Forensic psychologist: None Prior Function Level of Independence: Independent Able to Take Stairs?: Yes Driving: No Vocation: On disability Communication Communication: No difficulties Dominant Hand: Right   Vision - Assessment Vision Assessment: Vision impaired - to be further tested in functional context Additional Comments: Pt with difficulty keeping eyes open for vision assessment.  Cognition  Overall Cognitive Status: Appears within functional limits for tasks assessed/performed Arousal/Alertness: Awake/alert Orientation Level: Appears intact for tasks assessed Behavior During Session: [facility] for tasks performed    Extremity/Trunk Assessment Right Upper Extremity Assessment RUE ROM/Strength/Tone: WFL for tasks assessed RUE Sensation: WFL - Light Touch RUE Coordination: WFL - gross/fine motor Left Upper Extremity Assessment LUE ROM/Strength/Tone: WFL for tasks assessed LUE Sensation: WFL - Light Touch LUE Coordination: WFL - gross/fine motor  Trunk Assessment Trunk Assessment: Normal   Mobility Bed Mobility Bed Mobility: Not assessed Transfers Transfers: Sit to Stand;Stand to Sit Sit to Stand: 4: Min guard;With armrests;From chair/3-in-1 Stand to Sit: 4: Min guard;With upper extremity assist;To chair/3-in-1;With armrests Details for Transfer Assistance: cues for technique and hand placement   Exercise    Balance Static Standing Balance Static Standing - Balance Support: Right upper extremity supported;During functional activity Static Standing - Level of Assistance: 4: Min assist Static Standing - Comment/# of Minutes: 2  End of Session OT - End of Session Equipment Utilized During Treatment: Gait belt Activity Tolerance: Patient  limited by pain Patient left: in chair;with call bell/phone within reach Nurse Communication: Other (comment) (HA)  GO     Evern Bio 03/01/2012, 12:35 PM [phone]

## 2012-03-02 ENCOUNTER — Inpatient Hospital Stay (HOSPITAL_COMMUNITY): Payer: Medicare Other

## 2012-03-02 ENCOUNTER — Encounter (HOSPITAL_COMMUNITY): Payer: Self-pay | Admitting: Radiology

## 2012-03-02 DIAGNOSIS — I959 Hypotension, unspecified: Secondary | ICD-10-CM | POA: Diagnosis not present

## 2012-03-02 NOTE — Progress Notes (Signed)
Physical Therapy Treatment Patient Details Name: [name] MRN: [id number] DOB: 1956/01/03 Today's Date: 03/02/2012 Time: 3086-5784 PT Time Calculation (min): 14 min  PT Assessment / Plan / Recommendation Comments on Treatment Session  Patient agreeable to ambulation however became nauseated and had to return to sitting. RN made aware. Patient very unsafe with ambulation this session. Will continue to focus on safety and balacne with ambulation.     Follow Up Recommendations  Home health PT;Supervision - Intermittent    Barriers to Discharge        Equipment Recommendations  Rolling walker with 5" wheels    Recommendations for Other Services    Frequency Min 3X/week   Plan Discharge plan remains appropriate;Frequency remains appropriate    Precautions / Restrictions Precautions Precautions: Fall   Pertinent Vitals/Pain     Mobility  Bed Mobility Bed Mobility: Not assessed Transfers Sit to Stand: 4: Min guard;With upper extremity assist;From chair/3-in-1;With armrests Stand to Sit: 4: Min guard;With upper extremity assist;To chair/3-in-1;With armrests Details for Transfer Assistance: Cues for safe technique and hand placment Ambulation/Gait Ambulation/Gait Assistance: 4: Min assist Ambulation Distance (Feet): 80 Feet Assistive device: Rolling walker Ambulation/Gait Assistance Details: A for balance as patient staggered and had 2 significant LOB requiring A to maintain. Patient educated on keeping feet further apart and ambulating in straight line Gait Pattern: Scissoring;Ataxic;Narrow base of support;Trunk flexed;Lateral trunk lean to right;Lateral trunk lean to left    Exercises     PT Diagnosis:    PT Problem List:   PT Treatment Interventions:     PT Goals Acute Rehab PT Goals PT Goal: Sit to Stand - Progress: Progressing toward goal PT Goal: Ambulate - Progress: Progressing toward goal  Visit Information  Last PT Received On: 03/02/12 Assistance Needed:  +1    Subjective Data      Cognition  Overall Cognitive Status: Appears within functional limits for tasks assessed/performed Arousal/Alertness: Awake/alert Orientation Level: Appears intact for tasks assessed Behavior During Session: [facility] for tasks performed    Balance     End of Session PT - End of Session Equipment Utilized During Treatment: Gait belt Activity Tolerance: Patient tolerated treatment well;Other (comment) (limited by nausea) Patient left: in chair Nurse Communication: Mobility status   GP     Fredrich Birks 03/02/2012, 1:24 PM 03/02/2012 Fredrich Birks PTA [phone] pager [phone] office

## 2012-03-02 NOTE — Progress Notes (Signed)
MEDICATION RELATED CONSULT NOTE - Follow up  Pharmacy Consult for phenytoin Indication: hx seizures  Labs:  [facility] 02/29/12 0435  WBC --  HGB --  HCT --  PLT --  APTT --  CREATININE 0.50  LABCREA --  CREATININE 0.50  CREAT24HRUR --  MG --  PHOS --  ALBUMIN 3.5  PROT 6.1  ALBUMIN 3.5  AST 23  ALT 17  ALKPHOS 55  BILITOT 0.4  BILIDIR --  IBILI --   Estimated Creatinine Clearance: 73.7 ml/min (by C-G formula based on Cr of 0.5).    Assessment: 56 yo female with hx of seizures admitted with fall/skull fx due to phenytoin toxicity, ataxia and dizziness.  Home dose of phenytoin was 200 mg po TID.  Initial level 38.8; (corrected to 49.7).  Phenytoin level yesterday = 15.7; corrected to 18.75.  This is on the high end of the therapeutic range.  Patient also taking home dose of Vimpat. Today phenytoin level is 16.8.  Last albumin was 3.5.  We do NOT need to correct for albumen of 3.5 but if we did the corrected level for 16.8 would be ~ 21 mcg/ml.  No seizures reported.   Goal of Therapy:  Phenytoin level 10-20  Plan:  Continue phenytoin  200mg  po qAM and 300mg  qPM and check a level in 5 to 7 days to verify dose.  She does not need daily phenytoin levels. Herby Abraham, Pharm.D. [phone] 03/02/2012 2:24 PM

## 2012-03-02 NOTE — Progress Notes (Signed)
[facility] PROGRESS NOTE  [name] AOZ:[id number] DOB: 1956-07-11 DOA: 02/27/2012 PCP: Pearson Grippe, MD  Assessment/Plan: Active Problems:  Skull fracture  Dilantin toxicity  Seizure disorder  Intracranial bleed  Hypotension  1. Dilantin toxicity: The patient is a therapeutic range on her oral regimen of Dilantin. Appreciate pharmacy input. She currently is not exhibiting any signs of toxicity and hasn't had any seizures. It is likely the patient will be discharged her current dose of Dilantin. 2. Hypotension: The patient's blood pressures are ranging in the systolic of the low 90s. The patient denies any symptoms associated with hypotension. And she is unsure as to her normal blood pressure. I will obtain orthostatic vitals on the patient and further evaluate him. The patient is eating and drinking well is currently on no antihypertensive agents. 3. Skull fracture status post fall:A repeat CT scan of the head was accomplished emergently 7/27 as the patient suffered an alteration in her mental status with significant increased complaints of nausea - fortunately that CT scan did not reveal any significant changes - a second followup CT scan was accomplished 7/28 and in fact had begun to show improvement - Neurosurgery continues to follow with [place] - will recheck a final CT head in AM to assure continues to improve 4. Intracranial hemorrhage secondary to skull fracture and fall: As noted above CT of the head shows improvement. Neurosurgery has essentially signed off and feels that the patient can be discharged from their perspective. 5. Hypoglycemia: This is noted on metabolic profile. However he will A1c normal at 5.3. 6. History of DVT: Anticoagulation contraindicated secondary to present bleed  Code Status: Full Disposition Plan: Plan is to discharge home with home health PT  [name] A.  [facility] Pager [phone]. If 8PM-8AM, please contact night-coverage at  [email or web address], password [facility] 03/02/2012, 6:19 PM  LOS: 4 days   Brief narrative: 56 y.o. female with hx of seizure, s/p prior craniotomy for intracranial hematoma many years ago, with prior hx of dilantin toxicity, last seizure over 6 months ago, hx of DVT, asthma, who felt ataxic and fell hitting her head. 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 Neurosurgical consult was done and felt that no neurosurgical intervention was indicated   Consultants: Neurosurgery 7/26  Neurology 7/27   Procedures:  None  Antibiotics:  None  HPI/Subjective: Patient is alert and sitting up in chair. She is conversant and denies any headaches today. She states that she still having some degree of nausea but no vomiting. She denies any fever or chills or shortness of breath. She states that during ambulation physical therapy today she had no appreciable dizziness.  Objective: Filed Vitals:   03/01/12 2131 03/02/12 0536 03/02/12 0950 03/02/12 1400  BP: 131/45 98/47 99/42  94/47  Pulse: 85 80  84  Temp: 98.8 F (37.1 C) 98.9 F (37.2 C)  100.1 F (37.8 C)  TempSrc: Oral Oral  Oral  Resp: 20 18  18   Height:      Weight:      SpO2: 91% 92%  93%   Weight change:  No intake or output data in the 24 hours ending 03/02/12 1819  General: No acute respiratory distress  Lungs: Clear to auscultation bilaterally without wheezes or crackles - limited inspiration due to pain  Heart: Regular rate and rhythm without murmur gallop or rub  Lungs: Clear to auscultation, no wheezing or rhonchi noted Abdomen: Nontender, nondistended, soft, bowel sounds positive, no rebound,  no ascites, no appreciable mass  Extremities: No significant cyanosis, clubbing, or edema bilateral lower extremities  Neuro: Alert and oriented x4, moves all 4 extremities spontaneously, cranial nerves II through XII intact bilaterally    Data Reviewed: Basic Metabolic  Panel:  Lab 02/29/12 0435 02/28/12 0455 02/27/12 2307  NA 138 139 141  K 4.1 4.2 3.2*  CL 103 104 105  CO2 27 26 --  GLUCOSE 143* 127* 105*  BUN 5* 10 11  CREATININE 0.50 0.62 0.80  CALCIUM 8.7 8.6 --  MG -- -- --  PHOS -- -- --   Liver Function Tests:  Lab 02/29/12 0435 02/28/12 0455  AST 23 31  ALT 17 21  ALKPHOS 55 52  BILITOT 0.4 0.3  PROT 6.1 5.7*  ALBUMIN 3.5 3.4*   No results found for this basename: LIPASE:5,AMYLASE:5 in the last 168 hours No results found for this basename: AMMONIA:5 in the last 168 hours CBC:  Lab 02/28/12 0455 02/27/12 2307 02/27/12 2251  WBC 5.1 -- 7.6  NEUTROABS -- -- 6.0  HGB 12.1 13.3 13.0  HCT 36.2 39.0 38.3  MCV 102.3* -- 101.3*  PLT 125* -- 124*   Cardiac Enzymes: No results found for this basename: CKTOTAL:5,CKMB:5,CKMBINDEX:5,TROPONINI:5 in the last 168 hours BNP (last 3 results) No results found for this basename: PROBNP:3 in the last 8760 hours CBG: No results found for this basename: GLUCAP:5 in the last 168 hours  Recent Results (from the past 240 hour(s))  MRSA PCR SCREENING     Status: Normal   Collection Time   02/28/12  2:03 AM      Component Value Range Status Comment   MRSA by PCR NEGATIVE  NEGATIVE Final      Studies: Dg Chest 2 View  02/27/2012  *RADIOLOGY REPORT*  Clinical Data: Fall  CHEST - 2 VIEW  Comparison: August 16, 2010.  Findings: Cardiomediastinal silhouette appears normal.  No acute pulmonary disease is noted.  Bony thorax appears intact.  IMPRESSION: No acute cardiopulmonary abnormality seen.  Original Report Authenticated By: Venita Sheffield., M.D.   Ct Head Wo Contrast  03/02/2012  *RADIOLOGY REPORT*  Clinical Data: Headache.  Nausea.  Chest pain.  History of fall with skull fracture and intracranial hemorrhage.  CT HEAD WITHOUT CONTRAST  Technique:  Contiguous axial images were obtained from the base of the skull through the vertex without contrast.  Comparison: 02/29/2012.  Findings: The  previously demonstrated small amount of hemorrhage layering in the posterior aspect of an encephalomalacia cavity in the right occipital lobe is slightly better defined without an increase in amount.  A previously demonstrated tiny amount of hemorrhage layering in the occipital horn of the left lateral ventricle is no longer seen.  No new hemorrhage is demonstrated. Stable right parieto-occipital craniotomy changes.  The lateral ventricles remain mildly enlarged, without significant change.  IMPRESSION:  1.  No acute abnormality. 2.  Resolved intraventricular blood on the left and slightly better defined small amount of intraventricular blood on the right in the encephalomalacia cavity with no new areas of hemorrhage seen. 3.  Stable mild enlargement of the lateral ventricles.  Original Report Authenticated By: Darrol Angel, M.D.   Ct Head Wo Contrast  02/29/2012  *RADIOLOGY REPORT*  Clinical Data: Follow-up intracranial hemorrhage.  CT HEAD WITHOUT CONTRAST  Technique:  Contiguous axial images were obtained from the base of the skull through the vertex without contrast.  Comparison: Yesterday  Findings: Hemorrhage layering in the left occipital horn  has nearly resolved.  Hemorrhage layering in the encephalomalacia cavity of the right occipital lobe has markedly improved.  Right parietooccipital craniotomy fragment is slightly depressed and stable.  No new hemorrhage.  No mass effect or midline shift. Ventricles system is stable in size.  No new area of low density to suggest acute ischemic change.  IMPRESSION: Improved hemorrhage within the left occipital horn of the left lateral ventricle and in the right occipital encephalomalacia cavity.  No developing hydrocephalus.  Original Report Authenticated By: Donavan Burnet, M.D.   Ct Head Wo Contrast  02/28/2012  *RADIOLOGY REPORT*  Clinical Data: Fall.  Follow-up intracranial hemorrhage.  CT HEAD WITHOUT CONTRAST  Technique:  Contiguous axial images were  obtained from the base of the skull through the vertex without contrast.  Comparison: 02/27/2012  Findings: Prior   right parietal craniotomy.  Craniotomy flap is depressed but unchanged from earlier studies.  There is encephalomalacia in the right occipital parietal lobe. This has been present previously.  There is now blood layering in this encephalomalacia cavity which communicates with the lateral ventricles.  There is also some blood in the left occipital horn. Previously there was some blood in the cavity which has now layered posteriorly.  It is difficult determine if there has been any further hemorrhage due to the dependent settling of blood since yesterday's study.  Negative for hydrocephalus.  No acute infarct or mass. Nondisplaced right occipital fracture again noted.  IMPRESSION: High density hemorrhage is now layering in the postsurgical cavity in the right occipital parietal lobe.  There is some blood also in the left occipital horn.  No hydrocephalus.  Original Report Authenticated By: Camelia Phenes, M.D.   Ct Head Wo Contrast  02/27/2012  *RADIOLOGY REPORT*  Clinical Data: Fall, head laceration.  CT HEAD WITHOUT CONTRAST,CT CERVICAL SPINE WITHOUT CONTRAST  Technique:  Contiguous axial images were obtained from the base of the skull through the vertex without contrast.,Technique: Multidetector CT imaging of the cervical spine was performed. Multiplanar CT image reconstructions were also generated.  Comparison: 08/16/2010 MRI and CT.  Findings:  Head:  Right occipital lobe encephalomalacia is again noted. There has been interval development of curvilinear areas of high attenuation intermixed with the encephalomalacia and along the convexity of the occipital craniotomy, concerning for extra axial hemorrhage. No significant mass effect.  Otherwise, maintained gray-white differentiation.  No definite intraparenchymal hemorrhage or mass.  No definite CT evidence of acute infarction. No overt  hydrocephalus. The craniotomy defect appears similar to priors. There is a right occipital paramidline nondisplaced fracture just below the level the craniotomy, which extends to midline as the fracture line extends inferiorly. There is associated overlying soft tissue swelling Mild opacification of the left mastoid air cells.  Otherwise, the visualized paranasal sinuses and mastoid air cells are predominately clear.  Cervical spine: Lung apices are predominately clear.  Nonspecific nodule measuring 1.1 cm arising from the right lobe of the thyroid gland.  Maintained craniocervical relationship.  No dens fracture. Multilevel degenerative changes, most pronounced at C5-6 and C6-7. No paravertebral soft tissue abnormality.  No aggressive osseous lesion.  IMPRESSION: Mildly displaced right occipital bone fracture.  Interval development of high attenuation within the right occipital lobe encephalomalacia is concerning for extra axial hemorrhage.  No significant mass effect at this time.  Partially opacified left mastoid air cells.  Correlate clinically concerned for traumatic effusion or mastoiditis.  Multilevel cervical spine degenerative changes.  No acute fracture or dislocation identified.  Critical Value/emergent  results were called by telephone at the time of interpretation on 02/27/2012 at 11:00 p.m. to Dr. Freida Busman, who verbally acknowledged these results.  Original Report Authenticated By: Waneta Martins, M.D.   Ct Cervical Spine Wo Contrast  02/27/2012  *RADIOLOGY REPORT*  Clinical Data: Fall, head laceration.  CT HEAD WITHOUT CONTRAST,CT CERVICAL SPINE WITHOUT CONTRAST  Technique:  Contiguous axial images were obtained from the base of the skull through the vertex without contrast.,Technique: Multidetector CT imaging of the cervical spine was performed. Multiplanar CT image reconstructions were also generated.  Comparison: 08/16/2010 MRI and CT.  Findings:  Head:  Right occipital lobe encephalomalacia is  again noted. There has been interval development of curvilinear areas of high attenuation intermixed with the encephalomalacia and along the convexity of the occipital craniotomy, concerning for extra axial hemorrhage. No significant mass effect.  Otherwise, maintained gray-white differentiation.  No definite intraparenchymal hemorrhage or mass.  No definite CT evidence of acute infarction. No overt hydrocephalus. The craniotomy defect appears similar to priors. There is a right occipital paramidline nondisplaced fracture just below the level the craniotomy, which extends to midline as the fracture line extends inferiorly. There is associated overlying soft tissue swelling Mild opacification of the left mastoid air cells.  Otherwise, the visualized paranasal sinuses and mastoid air cells are predominately clear.  Cervical spine: Lung apices are predominately clear.  Nonspecific nodule measuring 1.1 cm arising from the right lobe of the thyroid gland.  Maintained craniocervical relationship.  No dens fracture. Multilevel degenerative changes, most pronounced at C5-6 and C6-7. No paravertebral soft tissue abnormality.  No aggressive osseous lesion.  IMPRESSION: Mildly displaced right occipital bone fracture.  Interval development of high attenuation within the right occipital lobe encephalomalacia is concerning for extra axial hemorrhage.  No significant mass effect at this time.  Partially opacified left mastoid air cells.  Correlate clinically concerned for traumatic effusion or mastoiditis.  Multilevel cervical spine degenerative changes.  No acute fracture or dislocation identified.  Critical Value/emergent results were called by telephone at the time of interpretation on 02/27/2012 at 11:00 p.m. to Dr. Freida Busman, who verbally acknowledged these results.  Original Report Authenticated By: Waneta Martins, M.D.    Scheduled Meds:   . antiseptic oral rinse  15 mL Mouth Rinse BID  . lacosamide  100 mg Oral BID    . phenytoin  200 mg Oral Daily  . phenytoin  300 mg Oral QHS   Continuous Infusions:   . sodium chloride 1,000 mL (03/01/12 0759)    Active Problems:  Skull fracture  Dilantin toxicity  Seizure disorder  Intracranial bleed

## 2012-03-02 NOTE — Progress Notes (Signed)
Doing well. No weakness. Mentally well. Balance better

## 2012-03-02 NOTE — Progress Notes (Signed)
Occupational Therapy Treatment Patient Details Name: [name] MRN: [id number] DOB: 05/15/1956 Today's Date: 03/02/2012 Time: 6295-2841 OT Time Calculation (min): 21 min  OT Assessment / Plan / Recommendation Comments on Treatment Session Pt progressing toward goals but will need to reach goals for d/c due to decreased caregiver support per pt report.  Pt reports she can not have 24/7 assist due to her children's work schedule.    Follow Up Recommendations  Home health OT;Supervision/Assistance - 24 hour    Barriers to Discharge       Equipment Recommendations  Rolling walker with 5" wheels (TBD by OT)    Recommendations for Other Services    Frequency Min 2X/week   Plan Discharge plan remains appropriate    Precautions / Restrictions Precautions Precautions: Fall   Pertinent Vitals/Pain See vitals    ADL  Eating/Feeding: Performed;Independent Where Assessed - Eating/Feeding: Chair Toilet Transfer: Performed;Minimal assistance Toilet Transfer Method: Surveyor, minerals: Materials engineer and Hygiene: Performed;Min guard Where Assessed - Engineer, mining and Hygiene: Sit to stand from 3-in-1 or toilet Equipment Used: Gait belt Transfers/Ambulation Related to ADLs: Min guard sit<>stand. Min assist during stand pivot for balance and steadying. ADL Comments: Unable to fully test vision due to decreased alertness.  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 At this time, pt reporting no change in vision, but will continue to assess during functional tasks.     OT Diagnosis:    OT Problem List:   OT Treatment Interventions:     OT Goals ADL Goals Pt Will Transfer to Toilet: with supervision;Regular height toilet;Ambulation ADL Goal: Toilet Transfer - Progress: Progressing toward goals Pt Will Perform Toileting - Clothing Manipulation: with supervision ADL  Goal: Toileting - Clothing Manipulation - Progress: Progressing toward goals Miscellaneous OT Goals Miscellaneous OT Goal #1: Pt will demonstrate visual skills necessary for mobility, reading, and performance of ADL. OT Goal: Miscellaneous Goal #1 - Progress: Progressing toward goals  Visit Information  Last OT Received On: 03/02/12 Assistance Needed: +1    Subjective Data      Prior Functioning       Cognition  Overall Cognitive Status: Appears within functional limits for tasks assessed/performed Arousal/Alertness: Lethargic Orientation Level: Appears intact for tasks assessed Behavior During Session: [facility] for tasks performed    Mobility Bed Mobility Bed Mobility: Supine to Sit;Sitting - Scoot to Edge of Bed Supine to Sit: 4: Min guard Sitting - Scoot to [place] of Bed: 5: Supervision Transfers Transfers: Sit to Stand;Stand to Sit Sit to Stand: 4: Min guard;From bed;From chair/3-in-1;With armrests;With upper extremity assist Stand to Sit: 4: Min guard;To chair/3-in-1;With armrests;With upper extremity assist Details for Transfer Assistance: Cues for safe technique and hand placment   Exercises    Balance    End of Session OT - End of Session Equipment Utilized During Treatment: Gait belt Activity Tolerance: Patient limited by fatigue Patient left: in chair;with call bell/phone within reach Nurse Communication: Mobility status  GO    03/02/2012 Cipriano Mile OTR/L Pager [phone] Office [phone]  Cipriano Mile 03/02/2012, 1:45 PM

## 2012-03-03 ENCOUNTER — Inpatient Hospital Stay (HOSPITAL_COMMUNITY): Payer: Medicare Other

## 2012-03-03 DIAGNOSIS — R509 Fever, unspecified: Secondary | ICD-10-CM

## 2012-03-03 DIAGNOSIS — R0789 Other chest pain: Secondary | ICD-10-CM | POA: Diagnosis present

## 2012-03-03 DIAGNOSIS — R071 Chest pain on breathing: Secondary | ICD-10-CM

## 2012-03-03 LAB — URINALYSIS, ROUTINE W REFLEX MICROSCOPIC
Glucose, UA: NEGATIVE mg/dL
Ketones, ur: NEGATIVE mg/dL
Leukocytes, UA: NEGATIVE
Nitrite: NEGATIVE
Protein, ur: NEGATIVE mg/dL
Urobilinogen, UA: 0.2 mg/dL (ref 0.0–1.0)

## 2012-03-03 LAB — CBC
HCT: 37.6 % (ref 36.0–46.0)
MCHC: 33.8 g/dL (ref 30.0–36.0)
Platelets: 111 10*3/uL — ABNORMAL LOW (ref 150–400)
RDW: 12.9 % (ref 11.5–15.5)
WBC: 6.8 10*3/uL (ref 4.0–10.5)

## 2012-03-03 LAB — BASIC METABOLIC PANEL
BUN: 9 mg/dL (ref 6–23)
Chloride: 99 mEq/L (ref 96–112)
GFR calc Af Amer: >90 mL/min (ref >90)
GFR calc non Af Amer: >90 mL/min (ref >90)
Potassium: 3.2 mEq/L — ABNORMAL LOW (ref 3.5–5.1)

## 2012-03-03 MED ORDER — NICOTINE 14 MG/24HR TD PT24: 1.0000 | MEDICATED_PATCH | TRANSDERMAL | Status: AC

## 2012-03-03 MED ORDER — AZITHROMYCIN 250 MG PO TABS: ORAL_TABLET | ORAL | Status: DC

## 2012-03-03 MED ORDER — POTASSIUM CHLORIDE CRYS ER 20 MEQ PO TBCR
40.0000 meq | EXTENDED_RELEASE_TABLET | ORAL | Status: AC
  Administered 2012-03-03 (×2): 40 meq via ORAL
  Filled 2012-03-03 (×2): qty 2

## 2012-03-03 MED ORDER — ACETAMINOPHEN 325 MG PO TABS: 650.0000 mg | ORAL_TABLET | Freq: Four times a day (QID) | ORAL | Status: DC | PRN

## 2012-03-03 MED ORDER — AZITHROMYCIN 500 MG PO TABS
500.0000 mg | ORAL_TABLET | Freq: Once | ORAL | Status: DC
  Filled 2012-03-03: qty 1

## 2012-03-03 MED ORDER — PHENYTOIN SODIUM EXTENDED 100 MG PO CAPS: ORAL_CAPSULE | ORAL | Status: DC

## 2012-03-03 MED ORDER — HYDROCODONE-ACETAMINOPHEN 5-500 MG PO TABS: 1.0000 | ORAL_TABLET | Freq: Four times a day (QID) | ORAL | Status: DC | PRN

## 2012-03-03 NOTE — Progress Notes (Signed)
PCP: Pearson Grippe, MD  Brief HPI:  56 y.o. female with hx of seizure, s/p prior craniotomy for intracranial hematoma many years ago, with prior hx of dilantin toxicity, last seizure over 6 months ago, hx of DVT, asthma, who felt ataxic and fell hitting her head. CT scan without contrast revealed Mildly displaced right occipital bone fracture. Interval development of high attenuation within the right occipital lobe concerning for extra axial hemorrhage. No significant mass effect at this time. Neurosurgical consult was done and felt that no neurosurgical intervention was indicated  Past medical history:  Past Medical History  Diagnosis Date  . Seizures   . DVT (deep venous thrombosis)   . Asthma   . Cerebral hemorrhage     Consultants: Neurosurgery  Procedures: None  Subjective: Patient complains of pain in the right rib area with difficulty breathing. Also mentions a dry cough. Wonder why she has low grade fever. No nausea. No dizziness. Denies dysuria. Denies joint pains. No diarrhea.  Objective: Vital signs in last 24 hours: Temp:  [98.3 F (36.8 C)-100.1 F (37.8 C)] 99.2 F (37.3 C) (07/31 0522) Pulse Rate:  [76-84] 82  (07/31 1002) Resp:  [18-20] 18  (07/31 1002) BP: (94-103)/(38-54) 103/41 mmHg (07/31 1002) SpO2:  [91 %-99 %] 98 % (07/31 1002) Weight change:  Last BM Date: 03/01/12  Intake/Output from previous day:   Intake/Output this shift:    General appearance: alert, cooperative, appears stated age and no distress Back: symmetric, no curvature. ROM normal. No CVA tenderness. Resp: clear to auscultation bilaterally Cardio: regular rate and rhythm, S1, S2 normal, no murmur, click, rub or gallop Chest wall: tender to palpation right chest. Reproduces pain. GI: soft, non-tender; bowel sounds normal; no masses,  no organomegaly Extremities: extremities normal, atraumatic, no cyanosis or edema Pulses: 2+ and symmetric Neurologic: Grossly normal  Lab  Results: No results found for this basename: WBC:2,HGB:2,HCT:2,PLT:2 in the last 72 hours BMET No results found for this basename: NA:2,K:2,CL:2,CO2:2,GLUCOSE:2,BUN:2,CREATININE:2,CALCIUM:2, AST:2,ALT:2 in the last 72 hours  Studies/Results: Ct Head Wo Contrast  03/02/2012  *RADIOLOGY REPORT*  Clinical Data: Headache.  Nausea.  Chest pain.  History of fall with skull fracture and intracranial hemorrhage.  CT HEAD WITHOUT CONTRAST  Technique:  Contiguous axial images were obtained from the base of the skull through the vertex without contrast.  Comparison: 02/29/2012.  Findings: The previously demonstrated small amount of hemorrhage layering in the posterior aspect of an encephalomalacia cavity in the right occipital lobe is slightly better defined without an increase in amount.  A previously demonstrated tiny amount of hemorrhage layering in the occipital horn of the left lateral ventricle is no longer seen.  No new hemorrhage is demonstrated. Stable right parieto-occipital craniotomy changes.  The lateral ventricles remain mildly enlarged, without significant change.  IMPRESSION:  1.  No acute abnormality. 2.  Resolved intraventricular blood on the left and slightly better defined small amount of intraventricular blood on the right in the encephalomalacia cavity with no new areas of hemorrhage seen. 3.  Stable mild enlargement of the lateral ventricles.  Original Report Authenticated By: Darrol Angel, M.D.    Medications:  Scheduled:   . antiseptic oral rinse  15 mL Mouth Rinse BID  . lacosamide  100 mg Oral BID  . phenytoin  200 mg Oral Daily  . phenytoin  300 mg Oral QHS  . DISCONTD: azithromycin  500 mg Oral Once   Continuous:   . sodium chloride 1,000 mL (03/01/12 0759)  JYN:[id number], HYDROmorphone (DILAUDID) injection, ondansetron (ZOFRAN) IV, ondansetron, oxyCODONE, promethazine  Assessment/Plan:  Active Problems:  Skull fracture  Dilantin toxicity  Seizure disorder   Intracranial bleed  Hypotension   Low Grade fever Etiology unclear. Could be mild bronchitis considering cough.  Check UA. Initial plan was to give Zithromax but since she is not being discharged today will hold off for now. Monitor.  Right Chest pain Has significant pain in right ribcage. Initial CXR did not reveal any fractures. Will repeat CXR. This is likely musculoskeletal from ribs. Will get CXR.  Dilantin toxicity The patient is a therapeutic range on her oral regimen of Dilantin. Appreciate pharmacy input. She currently is not exhibiting any signs of toxicity and hasn't had any seizures. It is likely the patient will be discharged her current dose of Dilantin.  Hypotension The patient's blood pressures are ranging in the systolic of the low 90s. The patient denies any symptoms associated with hypotension. And she is unsure as to her normal blood pressure. Orthostatics are normal. Is currently on no antihypertensive agents.  Skull fracture status post fall Neurosurgery has been following. Ct shows improvement. No further intervention.  Intracranial hemorrhage secondary to skull fracture and fall CT of the head shows improvement. Neurosurgery has essentially signed off and feels that the patient can be discharged from their perspective.  History of DVT Anticoagulation contraindicated secondary to present bleed. No acute DVT. SCD's  Code Status: Full  Disposition Plan: PT/OT now recommending CIR vs SNF. Will consult rehab and CSW.     LOS: 5 days   [facility]  [facility] Pager [phone] 03/03/2012, 11:49 AM

## 2012-03-03 NOTE — Consult Note (Signed)
Physical Medicine and Rehabilitation Consult Reason for Consult: ICH/skull fracture Referring Physician: [facility]   HPI: [name] is a 56 y.o. right-handed female with history of seizure, status post prior craniotomy for intracranial hematoma 1989 with last reported seizure 6 months ago. Admitted 02/27/2012 after a fall at home. Cranial CT scan showed mildly displaced right occipital bone fracture with small amount blood in the left occipital horn without hydrocephalus as well as high density hemorrhage layering in the postsurgical cavity of the right occipital parietal lobe from prior history of craniotomy with extensive encephalomalacia. Neurosurgery consulted Dr. Newell Coral advise conservative care with serial followup cranial CT scans. Latest followup CT scan 7/29 showed resolved intraventricular blood on the left and slightly better defined small amount of intraventricular blood on the right. Note upon admission to have Dilantin toxicity with Dilantin level 38.8. Dilantin has been adjusted to 500 mg total daily. Her latest Dilantin level was 16.8 on 03/02/2012. No seizure activity has been noted. Patient still remains impulsive at times with question of baseline. Physical and occupational therapy ongoing with recommendations for physical medicine rehabilitation consult to consider inpatient rehabilitation services   Review of Systems  Eyes: Positive for blurred vision.  Neurological: Positive for seizures and headaches.  All other systems reviewed and are negative.   Past Medical History  Diagnosis Date  . Seizures   . DVT (deep venous thrombosis)   . Asthma   . Cerebral hemorrhage    Past Surgical History  Procedure Date  . Cesarean section   . Brain surgery    No family history on file. Social History:  reports that she has been smoking.  She does not have any smokeless tobacco history on file. She reports that she does not drink alcohol or use illicit drugs. Allergies:    Allergies  Allergen Reactions  . Codeine Nausea And Vomiting  . Keppra (Levetiracetam)     Causes seizures.    Medications Prior to Admission  Medication Sig Dispense Refill  . Lacosamide (VIMPAT) 100 MG TABS Take 100 mg by mouth 2 (two) times daily.        . Vitamin B1-B12 (NEURO B-12 IJ) Inject 1,000 mcg as directed once.        . Vitamin D, Ergocalciferol, (DRISDOL) 50000 UNITS CAPS Take 50,000 Units by mouth every 7 (seven) days.       Marland Kitchen DISCONTD: HYDROcodone-acetaminophen (VICODIN) 5-500 MG per tablet Take 1 tablet by mouth every 6 (six) hours as needed. For pain       . DISCONTD: phenytoin (DILANTIN) 100 MG ER capsule Take 200 mg by mouth 3 (three) times daily.         Home: Home Living Lives With: Son;Daughter Available Help at Discharge: Family;Available PRN/intermittently Type of Home: House Home Access: Stairs to enter [employer] of Steps: 4 in front with no rail and 16 in the back with a rail on the left Home Layout: One level Bathroom Shower/Tub: Tub/shower unit;Curtain Firefighter: Standard Home Adaptive Equipment: None  Functional History: Prior Function Able to Take Stairs?: Yes Driving: No Vocation: On disability Functional Status:  Mobility: Bed Mobility Bed Mobility: Rolling Right;Right Sidelying to Sit;Sitting - Scoot to Edge of Bed Rolling Right: 5: Supervision;With rail Rolling Left: 4: Min guard Right Sidelying to Sit: 5: Supervision;HOB flat Left Sidelying to Sit: 4: Min guard;HOB elevated Supine to Sit: 4: Min guard Sitting - Scoot to [place] of Bed: 5: Supervision Transfers Transfers: Sit to Stand;Stand to Sit Sit to Stand:  4: Min guard;From bed;From chair/3-in-1;With upper extremity assist;With armrests Stand to Sit: 4: Min guard;To bed;To chair/3-in-1;With armrests;With upper extremity assist Ambulation/Gait Ambulation/Gait Assistance: 4: Min assist Ambulation Distance (Feet): 125 Feet Assistive device: Rolling  walker Ambulation/Gait Assistance Details: Pt. displayed weaving and instability, posterior lean and instability.  Needed manual facilitation for bring body forward for improved balance.   Gait Pattern: Scissoring;Ataxic;Narrow base of support;Trunk flexed;Lateral trunk lean to right;Lateral trunk lean to left Gait velocity: decreased Stairs: No Wheelchair Mobility Wheelchair Mobility: No  ADL: ADL Eating/Feeding: Performed;Independent Where Assessed - Eating/Feeding: Chair Grooming: Performed;Wash/dry hands;Wash/dry face;Teeth care;Min guard Where Assessed - Grooming: Supported standing Upper Body Bathing: Simulated;Minimal assistance Where Assessed - Upper Body Bathing: Unsupported sitting Lower Body Bathing: Simulated;Supervision/safety Where Assessed - Lower Body Bathing: Unsupported sitting Upper Body Dressing: Simulated;Minimal assistance Where Assessed - Upper Body Dressing: Unsupported sitting Lower Body Dressing: Performed;Supervision/safety (donned bil. socks) Where Assessed - Lower Body Dressing: Unsupported sitting Toilet Transfer: Simulated;Moderate assistance Toilet Transfer Method:  (ambulation) Toilet Transfer Equipment: Other (comment) (chair) Equipment Used: Gait belt;Rolling walker Transfers/Ambulation Related to ADLs: min guard for sit<>stand.  Pt requires mod assist with RW during ambulation due to significant balance deficits (scissoring, narrow base of support). ADL Comments: Pt impulsive with attempts to leave RW during functional mobility and standing components of ADL tasks.  Pt able to independently locate all grooming items on countertop and read room numbers accurately when locating room from hallway.  Cognition: Cognition Arousal/Alertness: Awake/alert Orientation Level: Oriented X4 Cognition Overall Cognitive Status: Impaired Area of Impairment: Safety/judgement Arousal/Alertness: Awake/alert Orientation Level: Appears intact for tasks  assessed Behavior During Session: Flat affect Safety/Judgement: Decreased safety judgement for tasks assessed;Impulsive Safety/Judgement - Other Comments: Pt impulsive during funcitonal mobility with use of RW.  Pt ambulating in hallway with therapist when quickly attempted to leave RW and pick up two pennies laying on floor.    Blood pressure 103/41, pulse 82, temperature 99.2 F (37.3 C), temperature source Oral, resp. rate 18, height 5\' 7"  (1.702 m), weight 58.1 kg (128 lb 1.4 oz), SpO2 98.00%. Physical Exam  Vitals reviewed. Constitutional: She is oriented to person, place, and time.  HENT:  Head: Normocephalic.  Eyes: Conjunctivae and EOM are normal. Left eye exhibits no discharge. No scleral icterus.  Neck: Normal range of motion. Neck supple. No thyromegaly present.  Cardiovascular: Normal rate and regular rhythm.   Pulmonary/Chest: Breath sounds normal. No respiratory distress. She has no wheezes.  Abdominal: Bowel sounds are normal. She exhibits no distension. There is no tenderness.  Musculoskeletal: She exhibits no edema.       Right chest wall pain with associate bruising  Neurological: She is alert and oriented to person, place, and time.       Flat affect but appropriate. She follows three-step commands. Complains of the room spinning when lifting or turning her head, but i didn't appreciate any associated nystagmus. Somewhat distractible. Moves all 4's with grossly 3+ to 4/5 strength with some pain inhibition in the RUE. No gross sensory deficits. Intentional tremors. No gross limb ataxia on exam.   Skin: Skin is warm and dry.  Psychiatric: Her mood appears anxious.  .phy  No results found for this or any previous visit (from the past 24 hour(s)). Ct Head Wo Contrast  03/02/2012  *RADIOLOGY REPORT*  Clinical Data: Headache.  Nausea.  Chest pain.  History of fall with skull fracture and intracranial hemorrhage.  CT HEAD WITHOUT CONTRAST  Technique:  Contiguous axial images  were obtained  from the base of the skull through the vertex without contrast.  Comparison: 02/29/2012.  Findings: The previously demonstrated small amount of hemorrhage layering in the posterior aspect of an encephalomalacia cavity in the right occipital lobe is slightly better defined without an increase in amount.  A previously demonstrated tiny amount of hemorrhage layering in the occipital horn of the left lateral ventricle is no longer seen.  No new hemorrhage is demonstrated. Stable right parieto-occipital craniotomy changes.  The lateral ventricles remain mildly enlarged, without significant change.  IMPRESSION:  1.  No acute abnormality. 2.  Resolved intraventricular blood on the left and slightly better defined small amount of intraventricular blood on the right in the encephalomalacia cavity with no new areas of hemorrhage seen. 3.  Stable mild enlargement of the lateral ventricles.  Original Report Authenticated By: Darrol Angel, M.D.    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 Does the need for close, 24 hr/day medical supervision in concert with the patient's rehab needs make it unreasonable for this patient to be served in a less intensive setting? Yes 2. Co-Morbidities requiring supervision/potential complications: hx of ICH, seizures, copd 3. Due to bladder management, bowel management, safety, skin/wound care, disease management, medication administration, pain management and patient education, does the patient require 24 hr/day rehab nursing? Yes 4. Does the patient require coordinated care of a physician, rehab nurse, PT (1-2 hrs/day, 5 days/week), OT (1-2 hrs/day, 5 days/week) and SLP (1-2 hrs/day, 5 days/week) to address physical and functional deficits in the context of the above medical diagnosis(es)? Yes Addressing deficits in the following areas: balance, endurance, locomotion, strength, transferring, bowel/bladder control, bathing, dressing,  feeding, grooming, toileting, cognition and psychosocial support 5. Can the patient actively participate in an intensive therapy program of at least 3 hrs of therapy per day at least 5 days per week? Yes 6. The potential for patient to make measurable gains while on inpatient rehab is excellent 7. Anticipated functional outcomes upon discharge from inpatient rehab are mod I with PT, mod I to supervision with OT, mod I with SLP. 8. Estimated rehab length of stay to reach the above functional goals is: 7-10 days 9. Does the patient have adequate social supports to accommodate these discharge functional goals? Yes and Potentially 10. Anticipated D/C setting: Home 11. Anticipated post D/C treatments: Outpt therapy 12. Overall Rehab/Functional Prognosis: excellent  RECOMMENDATIONS: This patient's condition is appropriate for continued rehabilitative care in the following setting: CIR Patient has agreed to participate in recommended program. Yes Note that insurance prior authorization may be required for reimbursement for recommended care.  Comment: Pt displays significant truncal ataxia with gait/movement and remains a high risk for falling. She remains highly anxious as well.  She was independent and carrying for her grandchild during the day prior to arrival. She needs to be independent upon dc to home as all of her family works. Would do well with a brief inpatient rehab stay.  Rehab RN to follow up.  Ivory Broad, MD  03/03/2012

## 2012-03-03 NOTE — Progress Notes (Addendum)
Occupational Therapy Treatment Patient Details Name: [name] MRN: [id number] DOB: [birth year] Today's Date: 03/03/2012 Time: 4098-1191 OT Time Calculation (min): 29 min  OT Assessment / Plan / Recommendation Comments on Treatment Session Pt with significant balance deficits during functional transfers/mobility.  Updating d/c plan to CIR as pt could benefit to increased safety and independence with ADLs before return home. If pt not accepted to CIR, then she will need ST SNF before return home.    Follow Up Recommendations  Inpatient Rehab    Barriers to Discharge       Equipment Recommendations  Defer to next venue    Recommendations for Other Services Rehab consult  Frequency Min 2X/week   Plan Discharge plan needs to be updated    Precautions / Restrictions Precautions Precautions: Fall   Pertinent Vitals/Pain See vitals    ADL  Grooming: Performed;Wash/dry hands;Wash/dry face;Teeth care;Min guard Where Assessed - Grooming: Supported standing Lower Body Bathing: Simulated;Supervision/safety Where Assessed - Lower Body Bathing: Unsupported sitting Lower Body Dressing: Performed;Supervision/safety (donned bil. socks) Where Assessed - Lower Body Dressing: Unsupported sitting Toilet Transfer: Simulated;Moderate assistance Toilet Transfer Method:  (ambulation) Toilet Transfer Equipment: Other (comment) (chair) Equipment Used: Gait belt;Rolling walker Transfers/Ambulation Related to ADLs: min guard for sit<>stand.  Pt requires mod assist with RW during ambulation due to significant balance deficits (scissoring, narrow base of support). ADL Comments: Pt impulsive with attempts to leave RW during functional mobility and standing components of ADL tasks.  Pt able to independently locate all grooming items on countertop and read room numbers accurately when locating room from hallway.    OT Diagnosis:    OT Problem List:   OT Treatment Interventions:     OT Goals ADL  Goals Pt Will Perform Grooming: with supervision;Standing at sink ADL Goal: Grooming - Progress: Progressing toward goals Pt Will Transfer to Toilet: with supervision;Regular height toilet;Ambulation ADL Goal: Toilet Transfer - Progress: Progressing toward goals Miscellaneous OT Goals Miscellaneous OT Goal #1: Pt will demonstrate visual skills necessary for mobility, reading, and performance of ADL. OT Goal: Miscellaneous Goal #1 - Progress: Progressing toward goals Miscellaneous OT Goal #2: Pt will perform dynamic standing balance task >5 min with supervision in prep for standing components of ADL tasks. OT Goal: Miscellaneous Goal #2 - Progress: Goal set today  Visit Information  Last OT Received On: 03/03/12 Assistance Needed: +1    Subjective Data      Prior Functioning       Cognition  Overall Cognitive Status: Impaired Area of Impairment: Safety/judgement Arousal/Alertness: Awake/alert Orientation Level: Appears intact for tasks assessed Behavior During Session: Flat affect Safety/Judgement: Decreased safety judgement for tasks assessed;Impulsive Safety/Judgement - Other Comments: Pt impulsive during funcitonal mobility with use of RW.  Pt ambulating in hallway with therapist when quickly attempted to leave RW and pick up two pennies laying on floor.      Mobility Bed Mobility Bed Mobility: Rolling Right;Right Sidelying to Sit;Sitting - Scoot to Edge of Bed Rolling Right: 5: Supervision;With rail Right Sidelying to Sit: 5: Supervision;HOB flat Sitting - Scoot to Edge of Bed: 5: Supervision Details for Bed Mobility Assistance: Instructed on log roll technique due to rib pain. Cueing for sequencing. Transfers Transfers: Sit to Stand;Stand to Sit Sit to Stand: 4: Min guard;From bed;From chair/3-in-1;With upper extremity assist;With armrests Stand to Sit: 4: Min guard;To bed;To chair/3-in-1;With armrests;With upper extremity assist Details for Transfer Assistance: Cues for  safe technique and to ensure safe hand placement before beginning descent.  Exercises    Balance Balance Balance Assessed: Yes Static Standing Balance Static Standing - Balance Support: During functional activity (supported anteriorly by countertop) Static Standing - Level of Assistance: Other (comment) (min guard) Static Standing - Comment/# of Minutes: 3 min at sink during grooming tasks. Dynamic Standing Balance Dynamic Standing - Balance Support: During functional activity Dynamic Standing - Level of Assistance: 4: Min assist Dynamic Standing - Balance Activities: Reaching for objects;Forward lean/weight shifting Dynamic Standing - Comments: Pt picking pennies up from ground. Min assist to maintain balance while leaning anteriorly.  End of Session OT - End of Session Equipment Utilized During Treatment: Gait belt Activity Tolerance: Patient tolerated treatment well Patient left: with call bell/phone within reach;in chair Nurse Communication: Mobility status  GO    03/03/2012 Cipriano Mile OTR/L Pager [phone] Office [phone]  Cipriano Mile 03/03/2012, 11:28 AM

## 2012-03-03 NOTE — Clinical Social Work Psychosocial (Signed)
     Clinical Social Work Department BRIEF PSYCHOSOCIAL ASSESSMENT 03/03/2012  Patient:  [name], [name]     Account Number:  [id number]     Admit date:  02/27/2012  Clinical Social Worker:  Peggyann Shoals  Date/Time:  03/03/2012 05:27 PM  Referred by:  Physician  Date Referred:  03/03/2012 Referred for  SNF Placement   Other Referral:   Interview type:  Patient Other interview type:    PSYCHOSOCIAL DATA Living Status:  FAMILY Admitted from facility:   Level of care:   Primary support name:  [name] Sedore Primary support relationship to patient:  CHILD, ADULT Degree of support available:   Supportive    CURRENT CONCERNS Current Concerns  Post-Acute Placement   Other Concerns:    SOCIAL WORK ASSESSMENT / PLAN CSW met with pt to address consult. Currently, PT is recommending CIR, however pt may benefit from SNF (if CIR declines). CSW introduced herself and explained role of social work.    Pt declined for this CSW to send referral for SNF. Pt shared that she would like to speak with her family. Pt does want to discharge to SNF at this time. CSW will speak with pt once she has spoken with her family and discuss discharge options. CSW will continue to follow.   Assessment/plan status:  Psychosocial Support/Ongoing Assessment of Needs Other assessment/ plan:   Information/referral to community resources:   SNF list    PATIENTS/FAMILYS RESPONSE TO PLAN OF CARE: Pt was alert and oriented. Pt declined SNF at this time.

## 2012-03-03 NOTE — Progress Notes (Signed)
Stable. Rehabilitation to see her. Neuro, better

## 2012-03-03 NOTE — Progress Notes (Signed)
Physical Therapy Treatment Patient Details Name: [name] MRN: [id number] DOB: [birth year] Today's Date: 03/03/2012 Time: 4098-1191 PT Time Calculation (min): 31 min  PT Assessment / Plan / Recommendation Comments on Treatment Session  Pt. is impulsive at times and unsafe for ambulation without assist.  Pt. does not believe she will have daytime assist if she returns home at acute DC.  I believe she is at risk of another significant fall.  Would like to have her considered for inpatient rehab before DC.  If she does not qualify, she may need short term SNF before DC home to maximize her safety.  She agrees to consider these options    Follow Up Recommendations  Inpatient Rehab    Barriers to Discharge        Equipment Recommendations  Defer to next venue    Recommendations for Other Services Rehab consult  Frequency Min 3X/week   Plan Discharge plan needs to be updated    Precautions / Restrictions Precautions Precautions: Fall   Pertinent Vitals/Pain Pain in right ribs, RN aware.  Pt. reated pain at "100" on 1-10 scale.    Mobility  Bed Mobility Bed Mobility: Rolling Right;Right Sidelying to Sit;Sitting - Scoot to Edge of Bed Rolling Right: 5: Supervision;With rail Right Sidelying to Sit: 5: Supervision;HOB flat Sitting - Scoot to Edge of Bed: 5: Supervision Details for Bed Mobility Assistance: Instructed on log roll technique due to rib pain. Cueing for sequencing. Transfers Transfers: Sit to Stand;Stand to Sit Sit to Stand: 4: Min guard;From bed;From chair/3-in-1;With upper extremity assist;With armrests Stand to Sit: 4: Min guard;To bed;To chair/3-in-1;With armrests;With upper extremity assist Details for Transfer Assistance: hand placement and safety cues, esp. due to impulsivity Ambulation/Gait Ambulation/Gait Assistance: 4: Min assist Ambulation Distance (Feet): 125 Feet Assistive device: Rolling walker Ambulation/Gait Assistance Details: Pt. displayed weaving  and instability, posterior lean and instability.  Needed manual facilitation for bring body forward for improved balance.   Gait Pattern: Scissoring;Ataxic;Narrow base of support;Trunk flexed;Lateral trunk lean to right;Lateral trunk lean to left Gait velocity: decreased Stairs: No    Exercises     PT Diagnosis:    PT Problem List:   PT Treatment Interventions:     PT Goals Acute Rehab PT Goals PT Goal: Supine/Side to Sit - Progress: Progressing toward goal PT Goal: Sit to Stand - Progress: Progressing toward goal PT Goal: Ambulate - Progress: Progressing toward goal  Visit Information  Last PT Received On: 03/03/12 Assistance Needed: +1    Subjective Data  Subjective: My ribs hurt so bad   Cognition  Overall Cognitive Status: Impaired Area of Impairment: Safety/judgement Arousal/Alertness: Awake/alert Orientation Level: Appears intact for tasks assessed Behavior During Session: Flat affect Safety/Judgement: Decreased safety judgement for tasks assessed;Impulsive Safety/Judgement - Other Comments: Pt impulsive during funcitonal mobility with use of RW.  Pt ambulating in hallway with therapist when quickly attempted to leave RW and pick up two pennies laying on floor.      Balance  Balance Balance Assessed: Yes Static Standing Balance Static Standing - Balance Support: During functional activity (supported anteriorly by countertop) Static Standing - Level of Assistance: Other (comment) (min guard) Static Standing - Comment/# of Minutes: 3 min at sink during grooming tasks. Dynamic Standing Balance Dynamic Standing - Balance Support: During functional activity Dynamic Standing - Level of Assistance: 4: Min assist Dynamic Standing - Balance Activities: Reaching for objects;Forward lean/weight shifting Dynamic Standing - Comments: Pt picking pennies up from ground. Min assist to maintain balance while  leaning anteriorly.  End of Session PT - End of Session Equipment Utilized  During Treatment: Gait belt Activity Tolerance: Patient tolerated treatment well Patient left: in chair Nurse Communication: Mobility status   GP     Ferman Hamming 03/03/2012, 11:43 AM Weldon Picking PT Acute Rehab Services [phone] [place] [phone]

## 2012-03-04 DIAGNOSIS — S069X9A Unspecified intracranial injury with loss of consciousness of unspecified duration, initial encounter: Secondary | ICD-10-CM

## 2012-03-04 DIAGNOSIS — W19XXXA Unspecified fall, initial encounter: Secondary | ICD-10-CM

## 2012-03-04 MED ORDER — HYDROCOD POLST-CHLORPHEN POLST 10-8 MG/5ML PO LQCR: 5.0000 mL | Freq: Two times a day (BID) | ORAL | Status: DC

## 2012-03-04 MED ORDER — AZITHROMYCIN 500 MG PO TABS
500.0000 mg | ORAL_TABLET | Freq: Every day | ORAL | Status: AC
  Administered 2012-03-04: 500 mg via ORAL
  Filled 2012-03-04: qty 1

## 2012-03-04 MED ORDER — HYDROCOD POLST-CHLORPHEN POLST 10-8 MG/5ML PO LQCR
5.0000 mL | Freq: Two times a day (BID) | ORAL | Status: DC
  Administered 2012-03-04 – 2012-03-05 (×3): 5 mL via ORAL
  Filled 2012-03-04 (×3): qty 5

## 2012-03-04 MED ORDER — AZITHROMYCIN 250 MG PO TABS
250.0000 mg | ORAL_TABLET | Freq: Every day | ORAL | Status: DC
  Administered 2012-03-05: 250 mg via ORAL
  Filled 2012-03-04: qty 1

## 2012-03-04 MED ORDER — AZITHROMYCIN 250 MG PO TABS: ORAL_TABLET | ORAL | Status: AC

## 2012-03-04 NOTE — Progress Notes (Signed)
Patient ID: [name], female   DOB: 1955/11/02, 56 y.o.   MRN: [id number] Neuro much better. No  weakness. C/o pain in rib cage. No sob. No seizures

## 2012-03-04 NOTE — Progress Notes (Signed)
PCP: Pearson Grippe, MD  Brief HPI:  56 y.o. female with hx of seizure, s/p prior craniotomy for intracranial hematoma many years ago, with prior hx of dilantin toxicity, last seizure over 6 months ago, hx of DVT, asthma, who felt ataxic and fell hitting her head. CT scan without contrast revealed Mildly displaced right occipital bone fracture. Interval development of high attenuation within the right occipital lobe concerning for extra axial hemorrhage. No significant mass effect at this time. Neurosurgical consult was done and felt that no neurosurgical intervention was indicated  Past medical history:  Past Medical History  Diagnosis Date  . Seizures   . DVT (deep venous thrombosis)   . Asthma   . Cerebral hemorrhage     Consultants: Neurosurgery  Procedures: None  Subjective: Patient complaining of pain in right chest. Increases with cough. Also mentions dizziness at times.  Objective: Vital signs in last 24 hours: Temp:  [98 F (36.7 C)-99.6 F (37.6 C)] 99.5 F (37.5 C) (08/01 0600) Pulse Rate:  [64-80] 64  (08/01 0600) Resp:  [16-18] 16  (08/01 0600) BP: (100-103)/(40-50) 103/45 mmHg (08/01 0600) SpO2:  [92 %-97 %] 92 % (08/01 0600) Weight change:  Last BM Date: 03/01/12 (is charted as this by nursing- pt states hasnt BM since adm)  Intake/Output from previous day:   Intake/Output this shift:    General appearance: alert, cooperative, appears stated age and no distress Back: symmetric, no curvature. ROM normal. No CVA tenderness. Resp: clear to auscultation bilaterally Cardio: regular rate and rhythm, S1, S2 normal, no murmur, click, rub or gallop Chest wall: tender to palpation right chest. Reproduces pain. GI: soft, non-tender; bowel sounds normal; no masses,  no organomegaly Extremities: extremities normal, atraumatic, no cyanosis or edema Pulses: 2+ and symmetric Neurologic: Grossly normal. No nystagmus seen.  Lab Results:  [facility] 03/03/12 1415  WBC  6.8  HGB 12.7  HCT 37.6  PLT 111*   BMET  Basename 03/03/12 1415  NA 137  K 3.2*  CL 99  CO2 29  GLUCOSE 113*  BUN 9  CREATININE 0.57  CALCIUM 8.7  ALT --    Studies/Results: Dg Chest Port 1 View  03/03/2012  *RADIOLOGY REPORT*  Clinical Data: Right-sided chest pain and cough  PORTABLE CHEST - 1 VIEW  Comparison: 02/27/2012; 08/16/2010; 05/28/2010  Findings:  Grossly unchanged cardiac silhouette and mediastinal contours given patient rotation.  The lungs appear hyperinflated.  Minimal right basilar heterogeneous opacities, likely atelectasis.  No focal airspace opacity.  No definite pleural effusion or pneumothorax. There is a minimally displaced fracture of the lateral aspect of the right seventh rib.  IMPRESSION: 1. Minimal displaced fracture of the lateral aspect of the right seventh rib. 2. No acute cardiopulmonary disease.  Original Report Authenticated By: Waynard Reeds, M.D.    Medications:  Scheduled:    . antiseptic oral rinse  15 mL Mouth Rinse BID  . lacosamide  100 mg Oral BID  . phenytoin  200 mg Oral Daily  . phenytoin  300 mg Oral QHS  . potassium chloride  40 mEq Oral Q4H  . DISCONTD: azithromycin  500 mg Oral Once   Continuous:    . sodium chloride 1,000 mL (03/01/12 0759)   WGN:[id number], HYDROmorphone (DILAUDID) injection, ondansetron (ZOFRAN) IV, ondansetron, oxyCODONE, promethazine  Assessment/Plan:  Active Problems:  Skull fracture  Dilantin toxicity  Seizure disorder  Intracranial bleed  Hypotension  Fever  Right-sided chest wall pain   Low Grade fever Etiology unclear.  Work up negative so far. She does have a cough and could have mild bronchitis. Will initiate Azithromycin. Monitor.  Right Chest pain Sec to Rib Fracture Has significant pain in right ribcage. CXR does reveal rib fracture with minimal displacement. Pain control. Control cough.   Dilantin toxicity The patient is a therapeutic range on her oral regimen of  Dilantin. Appreciate pharmacy input. She currently is not exhibiting any signs of toxicity and hasn't had any seizures. It is likely the patient will be discharged her current dose of Dilantin.  Hypotension The patient's blood pressures are ranging in the systolic of the low 90s. The patient denies any symptoms associated with hypotension. And she is unsure as to her normal blood pressure. Orthostatics are normal. Is currently on no antihypertensive agents. She remains stable.  Dizziness Probably from medications and her injuries. No neurological deficits. Continue to monitor  Skull fracture status post fall Neurosurgery has been following. Ct shows improvement. No further intervention.  Intracranial hemorrhage secondary to skull fracture and fall CT of the head shows improvement. Neurosurgery has essentially signed off and feels that the patient can be discharged from their perspective.  History of DVT Anticoagulation contraindicated secondary to present bleed. No acute DVT. SCD's  Code Status: Full  Disposition Plan: CIR has accepted. Await insurance approval.      LOS: 6 days   [facility]  [facility] Pager [phone] 03/04/2012, 10:06 AM

## 2012-03-04 NOTE — Discharge Summary (Addendum)
Physician Discharge Summary  Patient ID: [name] MRN: [id number] DOB/AGE: [birth year] 56 y.o.  Admit date: 02/27/2012 Discharge date: 03/05/2012  PCP: Pearson Grippe, MD  DISCHARGE DIAGNOSES:  Active Problems:  Skull fracture  Dilantin toxicity  Seizure disorder  Intracranial bleed  Hypotension  Fever  Right-sided chest wall pain   RECOMMENDATIONS TO PCP: Check Dilantin level in 3-4 days  DISCHARGE CONDITION: fair  INITIAL HISTORY: 56 y.o. female with hx of seizure, s/p prior craniotomy for intracranial hematoma many years ago, with prior hx of dilantin toxicity, last seizure over 6 months ago, hx of DVT, asthma, who felt ataxic and fell hitting her head. CT scan without contrast revealed Mildly displaced right occipital bone fracture. Interval development of high attenuation within the right occipital lobe concerning for extra axial hemorrhage. No significant mass effect at this time. Neurosurgical consult was done and felt that no neurosurgical intervention was indicated.  HOSPITAL COURSE:   Low Grade fever  Etiology unclear. Work up negative so far. She does have a cough and could have mild bronchitis. Will initiate Azithromycin.    Right Chest pain Sec to Rib Fracture  Has significant pain in right ribcage. CXR does reveal rib fracture with minimal displacement. Pain control. Control cough with tussionex.   Dilantin toxicity  The patient is a therapeutic range on her oral regimen of Dilantin. Appreciate pharmacy input. She currently is not exhibiting any signs of toxicity and hasn't had any seizures. She will need repeat check of her Dilantin level in a few days.  Hypotension  The patient's blood pressures are ranging in the systolic of the low 90s. The patient denies any symptoms associated with hypotension. And she is unsure as to her normal blood pressure. Orthostatics are normal. Is currently on no antihypertensive agents. She remains stable.   Dizziness    Probably from medications and her injuries. No neurological deficits. Continue to monitor   Skull fracture status post fall  Neurosurgery has been following. Ct shows improvement. No further intervention.   Intracranial hemorrhage secondary to skull fracture and fall  CT of the head shows improvement. Neurosurgery has essentially signed off and feels that the patient can be discharged from their perspective.   History of DVT  Anticoagulation contraindicated secondary to present bleed. No acute DVT. SCD's  Patient remains stable. She is ok for CIR.  IMAGING STUDIES Dg Chest 2 View  02/27/2012  *RADIOLOGY REPORT*  Clinical Data: Fall  CHEST - 2 VIEW  Comparison: August 16, 2010.  Findings: Cardiomediastinal silhouette appears normal.  No acute pulmonary disease is noted.  Bony thorax appears intact.  IMPRESSION: No acute cardiopulmonary abnormality seen.  Original Report Authenticated By: Venita Sheffield., M.D.   Ct Head Wo Contrast  03/02/2012  *RADIOLOGY REPORT*  Clinical Data: Headache.  Nausea.  Chest pain.  History of fall with skull fracture and intracranial hemorrhage.  CT HEAD WITHOUT CONTRAST  Technique:  Contiguous axial images were obtained from the base of the skull through the vertex without contrast.  Comparison: 02/29/2012.  Findings: The previously demonstrated small amount of hemorrhage layering in the posterior aspect of an encephalomalacia cavity in the right occipital lobe is slightly better defined without an increase in amount.  A previously demonstrated tiny amount of hemorrhage layering in the occipital horn of the left lateral ventricle is no longer seen.  No new hemorrhage is demonstrated. Stable right parieto-occipital craniotomy changes.  The lateral ventricles remain mildly enlarged, without significant change.  IMPRESSION:  1.  No acute abnormality. 2.  Resolved intraventricular blood on the left and slightly better defined small amount of intraventricular blood on  the right in the encephalomalacia cavity with no new areas of hemorrhage seen. 3.  Stable mild enlargement of the lateral ventricles.  Original Report Authenticated By: Darrol Angel, M.D.   Ct Head Wo Contrast  02/29/2012  *RADIOLOGY REPORT*  Clinical Data: Follow-up intracranial hemorrhage.  CT HEAD WITHOUT CONTRAST  Technique:  Contiguous axial images were obtained from the base of the skull through the vertex without contrast.  Comparison: Yesterday  Findings: Hemorrhage layering in the left occipital horn has nearly resolved.  Hemorrhage layering in the encephalomalacia cavity of the right occipital lobe has markedly improved.  Right parietooccipital craniotomy fragment is slightly depressed and stable.  No new hemorrhage.  No mass effect or midline shift. Ventricles system is stable in size.  No new area of low density to suggest acute ischemic change.  IMPRESSION: Improved hemorrhage within the left occipital horn of the left lateral ventricle and in the right occipital encephalomalacia cavity.  No developing hydrocephalus.  Original Report Authenticated By: Donavan Burnet, M.D.   Ct Head Wo Contrast  02/28/2012  *RADIOLOGY REPORT*  Clinical Data: Fall.  Follow-up intracranial hemorrhage.  CT HEAD WITHOUT CONTRAST  Technique:  Contiguous axial images were obtained from the base of the skull through the vertex without contrast.  Comparison: 02/27/2012  Findings: Prior   right parietal craniotomy.  Craniotomy flap is depressed but unchanged from earlier studies.  There is encephalomalacia in the right occipital parietal lobe. This has been present previously.  There is now blood layering in this encephalomalacia cavity which communicates with the lateral ventricles.  There is also some blood in the left occipital horn. Previously there was some blood in the cavity which has now layered posteriorly.  It is difficult determine if there has been any further hemorrhage due to the dependent settling of blood  since yesterday's study.  Negative for hydrocephalus.  No acute infarct or mass. Nondisplaced right occipital fracture again noted.  IMPRESSION: High density hemorrhage is now layering in the postsurgical cavity in the right occipital parietal lobe.  There is some blood also in the left occipital horn.  No hydrocephalus.  Original Report Authenticated By: Camelia Phenes, M.D.   Ct Head Wo Contrast  02/27/2012  *RADIOLOGY REPORT*  Clinical Data: Fall, head laceration.  CT HEAD WITHOUT CONTRAST,CT CERVICAL SPINE WITHOUT CONTRAST  Technique:  Contiguous axial images were obtained from the base of the skull through the vertex without contrast.,Technique: Multidetector CT imaging of the cervical spine was performed. Multiplanar CT image reconstructions were also generated.  Comparison: 08/16/2010 MRI and CT.  Findings:  Head:  Right occipital lobe encephalomalacia is again noted. There has been interval development of curvilinear areas of high attenuation intermixed with the encephalomalacia and along the convexity of the occipital craniotomy, concerning for extra axial hemorrhage. No significant mass effect.  Otherwise, maintained gray-white differentiation.  No definite intraparenchymal hemorrhage or mass.  No definite CT evidence of acute infarction. No overt hydrocephalus. The craniotomy defect appears similar to priors. There is a right occipital paramidline nondisplaced fracture just below the level the craniotomy, which extends to midline as the fracture line extends inferiorly. There is associated overlying soft tissue swelling Mild opacification of the left mastoid air cells.  Otherwise, the visualized paranasal sinuses and mastoid air cells are predominately clear.  Cervical spine: Lung apices are predominately clear.  Nonspecific nodule measuring 1.1 cm arising from the right lobe of the thyroid gland.  Maintained craniocervical relationship.  No dens fracture. Multilevel degenerative changes, most  pronounced at C5-6 and C6-7. No paravertebral soft tissue abnormality.  No aggressive osseous lesion.  IMPRESSION: Mildly displaced right occipital bone fracture.  Interval development of high attenuation within the right occipital lobe encephalomalacia is concerning for extra axial hemorrhage.  No significant mass effect at this time.  Partially opacified left mastoid air cells.  Correlate clinically concerned for traumatic effusion or mastoiditis.  Multilevel cervical spine degenerative changes.  No acute fracture or dislocation identified.  Critical Value/emergent results were called by telephone at the time of interpretation on 02/27/2012 at 11:00 p.m. to Dr. Freida Busman, who verbally acknowledged these results.  Original Report Authenticated By: Waneta Martins, M.D.   Ct Cervical Spine Wo Contrast  02/27/2012  *RADIOLOGY REPORT*  Clinical Data: Fall, head laceration.  CT HEAD WITHOUT CONTRAST,CT CERVICAL SPINE WITHOUT CONTRAST  Technique:  Contiguous axial images were obtained from the base of the skull through the vertex without contrast.,Technique: Multidetector CT imaging of the cervical spine was performed. Multiplanar CT image reconstructions were also generated.  Comparison: 08/16/2010 MRI and CT.  Findings:  Head:  Right occipital lobe encephalomalacia is again noted. There has been interval development of curvilinear areas of high attenuation intermixed with the encephalomalacia and along the convexity of the occipital craniotomy, concerning for extra axial hemorrhage. No significant mass effect.  Otherwise, maintained gray-white differentiation.  No definite intraparenchymal hemorrhage or mass.  No definite CT evidence of acute infarction. No overt hydrocephalus. The craniotomy defect appears similar to priors. There is a right occipital paramidline nondisplaced fracture just below the level the craniotomy, which extends to midline as the fracture line extends inferiorly. There is associated overlying  soft tissue swelling Mild opacification of the left mastoid air cells.  Otherwise, the visualized paranasal sinuses and mastoid air cells are predominately clear.  Cervical spine: Lung apices are predominately clear.  Nonspecific nodule measuring 1.1 cm arising from the right lobe of the thyroid gland.  Maintained craniocervical relationship.  No dens fracture. Multilevel degenerative changes, most pronounced at C5-6 and C6-7. No paravertebral soft tissue abnormality.  No aggressive osseous lesion.  IMPRESSION: Mildly displaced right occipital bone fracture.  Interval development of high attenuation within the right occipital lobe encephalomalacia is concerning for extra axial hemorrhage.  No significant mass effect at this time.  Partially opacified left mastoid air cells.  Correlate clinically concerned for traumatic effusion or mastoiditis.  Multilevel cervical spine degenerative changes.  No acute fracture or dislocation identified.  Critical Value/emergent results were called by telephone at the time of interpretation on 02/27/2012 at 11:00 p.m. to Dr. Freida Busman, who verbally acknowledged these results.  Original Report Authenticated By: Waneta Martins, M.D.   Dg Chest Port 1 View  03/03/2012  *RADIOLOGY REPORT*  Clinical Data: Right-sided chest pain and cough  PORTABLE CHEST - 1 VIEW  Comparison: 02/27/2012; 08/16/2010; 05/28/2010  Findings:  Grossly unchanged cardiac silhouette and mediastinal contours given patient rotation.  The lungs appear hyperinflated.  Minimal right basilar heterogeneous opacities, likely atelectasis.  No focal airspace opacity.  No definite pleural effusion or pneumothorax. There is a minimally displaced fracture of the lateral aspect of the right seventh rib.  IMPRESSION: 1. Minimal displaced fracture of the lateral aspect of the right seventh rib. 2. No acute cardiopulmonary disease.  Original Report Authenticated By: Judene Companion.D.  DISCHARGE EXAMINATION: See  progress note from earlier today.  DISPOSITION: 35 mins  Discharge Orders    Future Orders Please Complete By Expires   Diet - low sodium heart healthy      Diet - low sodium heart healthy      Increase activity slowly      Discharge instructions      Comments:   Be sure to follow up with your PCP in 2 weeks. Continue incentive spirometry at home   Increase activity slowly        Current Discharge Medication List    START taking these medications   Details  acetaminophen (TYLENOL) 325 MG tablet Take 2 tablets (650 mg total) by mouth 3 (three) times daily. For 5 days    azithromycin (ZITHROMAX) 250 MG tablet 1 tablet daily for 4 days starting 03/05/12 Qty: 6 each    chlorpheniramine-HYDROcodone (TUSSIONEX) 10-8 MG/5ML LQCR Take 5 mLs by mouth every 12 (twelve) hours. Qty: 140 mL    nicotine (NICODERM CQ) 14 mg/24hr patch Place 1 patch onto the skin daily. Qty: 28 patch, Refills: 0      CONTINUE these medications which have CHANGED   Details  HYDROcodone-acetaminophen (VICODIN) 5-500 MG per tablet Take 1 tablet by mouth every 4 (four) hours as needed for pain. For pain Qty: 30 tablet, Refills: 0    phenytoin (DILANTIN) 100 MG ER capsule 2 caps (total 200mg )  in the morning and 3 caps (total 300mg ) at bedtime daily Qty: 150 capsule, Refills: 0      CONTINUE these medications which have NOT CHANGED   Details  Lacosamide (VIMPAT) 100 MG TABS Take 100 mg by mouth 2 (two) times daily.      Vitamin B1-B12 (NEURO B-12 IJ) Inject 1,000 mcg as directed once.      Vitamin D, Ergocalciferol, (DRISDOL) 50000 UNITS CAPS Take 50,000 Units by mouth every 7 (seven) days.        Follow-up Information    Follow up with Pearson Grippe, MD. Schedule an appointment as soon as possible for a visit in 2 weeks. (post hospitalization follow up)    Contact information:   [address] [place] [address] [phone]       Follow up with Joycelyn Schmid, MD.  Schedule an appointment as soon as possible for a visit in 3 weeks. (seizure follow up)    Contact information:   [address] [place] [address] [facility] [place] [address] [phone]          TOTAL DISCHARGE TIME: 35 mins  [facility]  [facility] Pager [phone]  03/05/2012, 11:03 AM

## 2012-03-04 NOTE — Progress Notes (Addendum)
Rehab admissions - I have received a denial for acute inpatient rehab admission from insurance carrier.  They have approved a SNF level for rehab or HH follow-up services.  Call me for questions.  #[phone]  I spoke with patient's daughter.  Dtr will come to see patient around 5:30 pm today to talk about options for discharge.  #[phone]

## 2012-03-04 NOTE — Progress Notes (Signed)
Physical Therapy Treatment Patient Details Name: [name] MRN: [id number] DOB: [birth year] Today's Date: 03/04/2012 Time: 0913-0930 PT Time Calculation (min): 17 min  PT Assessment / Plan / Recommendation Comments on Treatment Session  Patient limited today by dizziness possibly cause by medications. Awaiting to see if insurance will aprove CIR    Follow Up Recommendations  Inpatient Rehab    Barriers to Discharge        Equipment Recommendations  Defer to next venue    Recommendations for Other Services    Frequency Min 3X/week   Plan Discharge plan remains appropriate;Frequency remains appropriate    Precautions / Restrictions Precautions Precautions: Fall   Pertinent Vitals/Pain     Mobility  Bed Mobility Supine to Sit: 5: Supervision Sitting - Scoot to Edge of Bed: 5: Supervision Details for Bed Mobility Assistance: Cues for safety Transfers Sit to Stand: 4: Min guard;With upper extremity assist;From bed Stand to Sit: 4: Min guard;To chair/3-in-1;With armrests;With upper extremity assist Ambulation/Gait Ambulation/Gait Assistance: 4: Min assist Ambulation Distance (Feet): 30 Feet Assistive device: Rolling walker Ambulation/Gait Assistance Details: Patient did not tolerate ambulation well due to dizziness Gait Pattern: Step-through pattern;Ataxic;Narrow base of support    Exercises     PT Diagnosis:    PT Problem List:   PT Treatment Interventions:     PT Goals Acute Rehab PT Goals PT Goal: Supine/Side to Sit - Progress: Progressing toward goal PT Goal: Sit to Stand - Progress: Progressing toward goal PT Goal: Ambulate - Progress: Progressing toward goal  Visit Information  Last PT Received On: 03/04/12 Assistance Needed: +1    Subjective Data      Cognition  Overall Cognitive Status: Impaired Area of Impairment: Safety/judgement Arousal/Alertness: Awake/alert Orientation Level: Appears intact for tasks assessed Behavior During Session:  Anxious Safety/Judgement: Decreased awareness of safety precautions;Decreased safety judgement for tasks assessed;Impulsive    Balance     End of Session PT - End of Session Equipment Utilized During Treatment: Gait belt Activity Tolerance: Other (comment) (by dizziness) Patient left: in chair;with call bell/phone within reach Nurse Communication: Mobility status   GP     Fredrich Birks 03/04/2012, 12:06 PM 03/04/2012 Fredrich Birks PTA [phone] pager [phone] office

## 2012-03-04 NOTE — Progress Notes (Signed)
Rehab admissions - Evaluated for possible admission.  I spoke with patient and she would like inpatient rehab admission.  I have opened the case with [facility] and will fax information requesting acute inpatient rehab admission.  Will await response from insurance carrier.  I am hopeful for an answer later this afternoon.  Call me for questions.  #[phone]

## 2012-03-05 ENCOUNTER — Inpatient Hospital Stay (HOSPITAL_COMMUNITY): Payer: Medicare Other | Admitting: Occupational Therapy

## 2012-03-05 ENCOUNTER — Inpatient Hospital Stay (HOSPITAL_COMMUNITY): Payer: Medicare Other | Admitting: *Deleted

## 2012-03-05 ENCOUNTER — Encounter (HOSPITAL_COMMUNITY): Payer: Medicare Other | Admitting: Occupational Therapy

## 2012-03-05 ENCOUNTER — Inpatient Hospital Stay (HOSPITAL_COMMUNITY): Payer: Medicare Other | Admitting: Speech Pathology

## 2012-03-05 ENCOUNTER — Inpatient Hospital Stay (HOSPITAL_COMMUNITY): Payer: Medicare Other | Admitting: Physical Therapy

## 2012-03-05 DIAGNOSIS — I629 Nontraumatic intracranial hemorrhage, unspecified: Secondary | ICD-10-CM

## 2012-03-05 MED ORDER — ACETAMINOPHEN 325 MG PO TABS
650.0000 mg | ORAL_TABLET | Freq: Three times a day (TID) | ORAL | Status: DC
  Administered 2012-03-05 (×2): 650 mg via ORAL
  Filled 2012-03-05 (×2): qty 2

## 2012-03-05 MED ORDER — HYDROCODONE-ACETAMINOPHEN 5-500 MG PO TABS: 1.0000 | ORAL_TABLET | ORAL | Status: DC | PRN

## 2012-03-05 MED ORDER — ACETAMINOPHEN 325 MG PO TABS: 650.0000 mg | ORAL_TABLET | Freq: Three times a day (TID) | ORAL | Status: DC

## 2012-03-05 NOTE — Progress Notes (Signed)
Attempted three times calls to give report but to no avail, asssessment remain unchanged prior to discharged.

## 2012-03-05 NOTE — Progress Notes (Signed)
Physical Therapy Treatment Patient Details Name: [name] MRN: [id number] DOB: 28-Mar-1956 Today's Date: 03/05/2012 Time: 4098-1191 PT Time Calculation (min): 16 min  PT Assessment / Plan / Recommendation Comments on Treatment Session  Pt with improvements in ambulation distance, although still unsafe gait secondary to ataxia. Spoke with pt regarding possible SNF upon dc, discussed with SW as well. Continue on improving gait for safety    Follow Up Recommendations  Skilled nursing facility       Equipment Recommendations  Defer to next venue       Frequency Min 3X/week   Plan Discharge plan remains appropriate;Frequency remains appropriate    Precautions / Restrictions Precautions Precautions: Fall Restrictions Weight Bearing Restrictions: No       Mobility  Bed Mobility Bed Mobility: Rolling Right;Right Sidelying to Sit;Sitting - Scoot to Edge of Bed Rolling Right: 5: Supervision;With rail Right Sidelying to Sit: 5: Supervision;HOB flat Details for Bed Mobility Assistance: Supervision for safety with cueing for technique to decrease rib pain Transfers Transfers: Sit to Stand;Stand to Sit Sit to Stand: 4: Min guard;With upper extremity assist;From bed Stand to Sit: 4: Min guard;To chair/3-in-1;With armrests;With upper extremity assist Details for Transfer Assistance: VC for hand placement for safety. Pt with good control of descent Ambulation/Gait Ambulation/Gait Assistance: 4: Min assist;4: Min guard Ambulation Distance (Feet): 150 Feet Assistive device: Rolling walker Ambulation/Gait Assistance Details: Pt with increased steadiness with RW, although required min assist at times due to balance loss. Ataxia present throughout ambulation, pt aware. Cueing for wider base of support and increased heel/toe to improve coordination during ambulation Gait Pattern: Step-through pattern;Ataxic;Narrow base of support Gait velocity: decreased      PT Goals Acute Rehab PT  Goals PT Goal: Supine/Side to Sit - Progress: Progressing toward goal PT Goal: Sit to Stand - Progress: Progressing toward goal PT Goal: Ambulate - Progress: Progressing toward goal  Visit Information  Last PT Received On: 03/05/12 Assistance Needed: +1    Subjective Data  Subjective: "It hurts really bad when I'm coughing."   Cognition  Overall Cognitive Status: Impaired Area of Impairment: Safety/judgement Arousal/Alertness: Awake/alert Orientation Level: Appears intact for tasks assessed Behavior During Session: Anxious Safety/Judgement: Decreased awareness of safety precautions;Decreased safety judgement for tasks assessed;Impulsive       End of Session PT - End of Session Equipment Utilized During Treatment: Gait belt Activity Tolerance: Patient tolerated treatment well Patient left: in chair;with call bell/phone within reach Nurse Communication: Mobility status    Milana Kidney 03/05/2012, 4:05 PM 03/05/2012 Milana Kidney DPT PAGER: [phone] OFFICE: [phone]

## 2012-03-05 NOTE — Clinical Social Work Note (Signed)
Pt is ready for discharge. Insurance declined CIR and SNF is recommended. Pt and daughter are agreeable. Pt will discharge to [facility]. Facility has received discharge summary and is ready to accept pt. [facility] will provide transportation. CSW is signing off as no further needs identified.   Dede Query, MSW, Theresia Majors [phone]

## 2012-03-05 NOTE — Clinical Social Work Placement (Signed)
     Clinical Social Work Department CLINICAL SOCIAL WORK PLACEMENT NOTE 03/05/2012  Patient:  [name], [name]  Account Number:  [id number] Admit date:  02/27/2012  Clinical Social Worker:  Peggyann Shoals  Date/time:  03/05/2012 05:32 PM  Clinical Social Work is seeking post-discharge placement for this patient at the following level of care:   SKILLED NURSING   (*CSW will update this form in Epic as items are completed)   03/05/2012  Patient/family provided with [facility] Department of Clinical Social Works list of facilities offering this level of care within the geographic area requested by the patient (or if unable, by the patients family).  03/05/2012  Patient/family informed of their freedom to choose among providers that offer the needed level of care, that participate in Medicare, Medicaid or managed care program needed by the patient, have an available bed and are willing to accept the patient.  03/05/2012  Patient/family informed of [facility] ownership interest in [facility], as well as of the fact that they are under no obligation to receive care at this facility.  PASARR submitted to EDS on 03/05/2012 PASARR number received from EDS on 03/05/2012  FL2 transmitted to all facilities in geographic area requested by pt/family on  03/05/2012 FL2 transmitted to all facilities within larger geographic area on   Patient informed that his/her managed care company has contracts with or will negotiate with  certain facilities, including the following:     Patient/family informed of bed offers received:  03/05/2012 Patient chooses bed at [facility] Physician recommends and patient chooses bed at  [facility]  Patient to be transferred to [facility] on  03/05/2012 Patient to be transferred to facility by [facility]  The following physician request were entered in Epic:   Additional  Comments:

## 2012-03-05 NOTE — Progress Notes (Signed)
PCP: Pearson Grippe, MD  Brief HPI:  56 y.o. female with hx of seizure, s/p prior craniotomy for intracranial hematoma many years ago, with prior hx of dilantin toxicity, last seizure over 6 months ago, hx of DVT, asthma, who felt ataxic and fell hitting her head. CT scan without contrast revealed Mildly displaced right occipital bone fracture. Interval development of high attenuation within the right occipital lobe concerning for extra axial hemorrhage. No significant mass effect at this time. Neurosurgical consult was done and felt that no neurosurgical intervention was indicated  Past medical history:  Past Medical History  Diagnosis Date  . Seizures   . DVT (deep venous thrombosis)   . Asthma   . Cerebral hemorrhage     Consultants: Neurosurgery  Procedures: None  Subjective: Patient continues to complain of pain in right chest. Increases with cough.   Objective: Vital signs in last 24 hours: Temp:  [98.8 F (37.1 C)-99.8 F (37.7 C)] 98.9 F (37.2 C) (08/02 0628) Pulse Rate:  [60-72] 60  (08/02 0628) Resp:  [16] 16  (08/02 0628) BP: (98-124)/(43-65) 104/45 mmHg (08/02 0628) SpO2:  [91 %-94 %] 91 % (08/02 0628) Weight change:  Last BM Date: 03/01/12 (is charted as this by nursing- pt states hasnt BM since adm)  Intake/Output from previous day: 08/01 0701 - 08/02 0700 In: 200 [P.O.:200] Out: -  Intake/Output this shift:    General appearance: alert, cooperative, appears stated age and no distress Back: symmetric, no curvature. ROM normal. No CVA tenderness. Resp: clear to auscultation bilaterally Cardio: regular rate and rhythm, S1, S2 normal, no murmur, click, rub or gallop Chest wall: tender to palpation right chest. Reproduces pain. GI: soft, non-tender; bowel sounds normal; no masses,  no organomegaly Neurologic: Grossly normal. No nystagmus seen.  Lab Results:  [facility] 03/03/12 1415  WBC 6.8  HGB 12.7  HCT 37.6  PLT 111*   BMET  Basename 03/03/12  1415  NA 137  K 3.2*  CL 99  CO2 29  GLUCOSE 113*  BUN 9  CREATININE 0.57  CALCIUM 8.7  ALT --    Studies/Results: Dg Chest Port 1 View  03/03/2012  *RADIOLOGY REPORT*  Clinical Data: Right-sided chest pain and cough  PORTABLE CHEST - 1 VIEW  Comparison: 02/27/2012; 08/16/2010; 05/28/2010  Findings:  Grossly unchanged cardiac silhouette and mediastinal contours given patient rotation.  The lungs appear hyperinflated.  Minimal right basilar heterogeneous opacities, likely atelectasis.  No focal airspace opacity.  No definite pleural effusion or pneumothorax. There is a minimally displaced fracture of the lateral aspect of the right seventh rib.  IMPRESSION: 1. Minimal displaced fracture of the lateral aspect of the right seventh rib. 2. No acute cardiopulmonary disease.  Original Report Authenticated By: Waynard Reeds, M.D.    Medications:  Scheduled:    . acetaminophen  650 mg Oral TID  . antiseptic oral rinse  15 mL Mouth Rinse BID  . azithromycin  500 mg Oral Daily   Followed by  . azithromycin  250 mg Oral Daily  . chlorpheniramine-HYDROcodone  5 mL Oral Q12H  . lacosamide  100 mg Oral BID  . phenytoin  200 mg Oral Daily  . phenytoin  300 mg Oral QHS   Continuous:    . sodium chloride 1,000 mL (03/01/12 0759)   ZOX:[id number], HYDROmorphone (DILAUDID) injection, ondansetron (ZOFRAN) IV, ondansetron, oxyCODONE, promethazine  Assessment/Plan:  Active Problems:  Skull fracture  Dilantin toxicity  Seizure disorder  Intracranial bleed  Hypotension  Fever  Right-sided chest wall pain   Low Grade fever Resolving. Etiology unclear. Work up negative so far. She does have a cough and could have mild bronchitis. On Azithromycin. Stable.  Right Chest pain Sec to Rib Fracture Has significant pain in right ribcage. CXR does reveal rib fracture with minimal displacement. Pain control. Control cough. Add ATC tylenol.  Dilantin toxicity The patient is a therapeutic  range on her oral regimen of Dilantin. Appreciate pharmacy input. She currently is not exhibiting any signs of toxicity and hasn't had any seizures.   Hypotension The patient's blood pressures are ranging in the systolic of the low 90s. The patient denies any symptoms associated with hypotension. And she is unsure as to her normal blood pressure. Orthostatics are normal. Is currently on no antihypertensive agents. She remains stable.  Dizziness Probably from medications and her injuries. No neurological deficits. Continue to monitor. PT/OT  Skull fracture status post fall Neurosurgery has been following. Ct shows improvement. No further intervention.  Intracranial hemorrhage secondary to skull fracture and fall CT of the head shows improvement. Neurosurgery has essentially signed off and feels that the patient can be discharged from their perspective.  History of DVT Anticoagulation contraindicated secondary to present bleed. No acute DVT. SCD's  Code Status: Full  Disposition Plan: CIR has accepted. Await insurance approval.   Patient is stable for discharge. Unfortunately her insurance did not approve inpatient rehab. She is agreeable to go to SNF. Hopefully today.   LOS: 7 days   [facility]  [facility] Pager [phone] 03/05/2012, 11:07 AM

## 2012-03-05 NOTE — Care Management Note (Signed)
    Page 1 of 1   03/05/2012     4:31:03 PM   CARE MANAGEMENT NOTE 03/05/2012  Patient:  SHANDALE, MALAK   Account Number:  [id number]  Date Initiated:  03/05/2012  Documentation initiated by:  Onnie Boer  Subjective/Objective Assessment:   PT WAS ADMITTED  WITH SX AND SKULL FRACTURE     Action/Plan:   PROGRESSION OF CARE AND DISCHARGE PLANNING   Anticipated DC Date:  03/05/2012   Anticipated DC Plan:  SKILLED NURSING FACILITY  In-house referral  Clinical Social Worker      DC Planning Services  CM consult      Choice offered to / List presented to:             Status of service:  Completed, signed off Medicare Important Message given?   (If response is "NO", the following Medicare IM given date fields will be blank) Date Medicare IM given:   Date Additional Medicare IM given:    Discharge Disposition:  SKILLED NURSING FACILITY  Per UR Regulation:  Reviewed for med. necessity/level of care/duration of stay  If discussed at Long Length of Stay Meetings, dates discussed:    Comments:  03/05/12 Onnie Boer, RN, BSN 1630 PT IS TO BE  DC'D TO [facility]

## 2012-06-24 ENCOUNTER — Emergency Department (HOSPITAL_COMMUNITY)
Admission: EM | Admit: 2012-06-24 | Discharge: 2012-06-24 | Disposition: A | Discharge status: Home / Self Care | Payer: Medicare Other | Attending: Emergency Medicine | Admitting: Emergency Medicine

## 2012-06-24 DIAGNOSIS — G40909 Epilepsy, unspecified, not intractable, without status epilepticus: Secondary | ICD-10-CM | POA: Insufficient documentation

## 2012-06-24 DIAGNOSIS — Z8679 Personal history of other diseases of the circulatory system: Secondary | ICD-10-CM | POA: Insufficient documentation

## 2012-06-24 DIAGNOSIS — F17200 Nicotine dependence, unspecified, uncomplicated: Secondary | ICD-10-CM | POA: Insufficient documentation

## 2012-06-24 DIAGNOSIS — Z86718 Personal history of other venous thrombosis and embolism: Secondary | ICD-10-CM | POA: Insufficient documentation

## 2012-06-24 DIAGNOSIS — R569 Unspecified convulsions: Secondary | ICD-10-CM

## 2012-06-24 DIAGNOSIS — R7889 Finding of other specified substances, not normally found in blood: Secondary | ICD-10-CM

## 2012-06-24 DIAGNOSIS — R892 Abnormal level of other drugs, medicaments and biological substances in specimens from other organs, systems and tissues: Secondary | ICD-10-CM | POA: Insufficient documentation

## 2012-06-24 DIAGNOSIS — J45909 Unspecified asthma, uncomplicated: Secondary | ICD-10-CM | POA: Insufficient documentation

## 2012-06-24 LAB — POCT I-STAT, CHEM 8
Calcium, Ion: 1.11 mmol/L — ABNORMAL LOW (ref 1.12–1.23)
Chloride: 107 mEq/L (ref 96–112)
Glucose, Bld: 91 mg/dL (ref 70–99)
HCT: 38 % (ref 36.0–46.0)
Hemoglobin: 12.9 g/dL (ref 12.0–15.0)
Potassium: 4.3 mEq/L (ref 3.5–5.1)

## 2012-06-24 LAB — PHENYTOIN LEVEL, TOTAL: Phenytoin Lvl: 27.6 ug/mL — ABNORMAL HIGH (ref 10.0–20.0)

## 2012-06-24 MED ORDER — SODIUM CHLORIDE 0.9 % IV SOLN: INTRAVENOUS | Status: DC

## 2012-06-24 NOTE — ED Notes (Signed)
Pt reports having a seizure for a few minutes "5 minutes at the most it wasn't long" about 1 hour ago. Pt reports she has taken her medication for seizures and has not missed any doses. Pt denies any injury and states she was sitting down at the time of her seizure today.

## 2012-06-24 NOTE — ED Notes (Signed)
ZOX:[id number]<AV> Expected date:06/24/12<BR> Expected time: 7:17 PM<BR> Means of arrival:Ambulance<BR> Comments:<BR> seizure

## 2012-06-24 NOTE — ED Notes (Signed)
Amedeo Gory, RN given update.

## 2012-06-24 NOTE — ED Provider Notes (Signed)
History     CSN: [id number]  Arrival date & time 06/24/12  1928   First MD Initiated Contact with Patient 06/24/12 2132      Chief Complaint  Patient presents with  . Seizures    (Consider location/radiation/quality/duration/timing/severity/associated sxs/prior treatment) Patient is a 56 y.o. female presenting with seizures. The history is provided by the patient.  Seizures    patient here after having a seizure witnessed by her mother. Seizure lasted less than 2 minutes and was associated with a mild postictal period. Patient does have a history of seizures and takes Dilantin and vimpat. Patient denies any loss of bladder function. No tongue biting. States her last seizure was one year ago. Has been under a lot of stress and thinks that is what caused her seizure tonight. She is back at her baseline at this time  Past Medical History  Diagnosis Date  . Seizures   . DVT (deep venous thrombosis)   . Asthma   . Cerebral hemorrhage     Past Surgical History  Procedure Date  . Cesarean section   . Brain surgery     No family history on file.  History  Substance Use Topics  . Smoking status: Current Every Day Smoker  . Smokeless tobacco: Not on file  . Alcohol Use: No    OB History    Grav Para Term Preterm Abortions TAB SAB Ect Mult Living                  Review of Systems  Neurological: Positive for seizures.  All other systems reviewed and are negative.    Allergies  Codeine and Keppra  Home Medications   Current Outpatient Rx  Name  Route  Sig  Dispense  Refill  . HYDROCODONE-ACETAMINOPHEN 5-500 MG PO TABS   Oral   Take 0.5 tablets by mouth every 4 (four) hours as needed. For pain         . LACOSAMIDE 100 MG PO TABS   Oral   Take 100 mg by mouth at bedtime.          Marland Kitchen PHENYTOIN SODIUM EXTENDED 100 MG PO CAPS   Oral   Take 200 mg by mouth 2 (two) times daily.           BP 119/46  Pulse 73  Temp 98.3 F (36.8 C) (Oral)  Resp 18   SpO2 97%  Physical Exam  Nursing note and vitals reviewed. Constitutional: She is oriented to person, place, and time. She appears well-developed and well-nourished.  Non-toxic appearance. No distress.  HENT:  Head: Normocephalic and atraumatic.  Eyes: Conjunctivae normal, EOM and lids are normal. Pupils are equal, round, and reactive to light.  Neck: Normal range of motion. Neck supple. No tracheal deviation present. No mass present.  Cardiovascular: Normal rate, regular rhythm and normal heart sounds.  Exam reveals no gallop.   No murmur heard. Pulmonary/Chest: Effort normal and breath sounds normal. No stridor. No respiratory distress. She has no decreased breath sounds. She has no wheezes. She has no rhonchi. She has no rales.  Abdominal: Soft. Normal appearance and bowel sounds are normal. She exhibits no distension. There is no tenderness. There is no rebound and no CVA tenderness.  Musculoskeletal: Normal range of motion. She exhibits no edema and no tenderness.  Neurological: She is alert and oriented to person, place, and time. She has normal strength. No cranial nerve deficit or sensory deficit. GCS eye subscore is 4. GCS verbal  subscore is 5. GCS motor subscore is 6.  Skin: Skin is warm and dry. No abrasion and no rash noted.  Psychiatric: Her speech is normal. Her affect is blunt. She is slowed.    ED Course  Procedures (including critical care time)   Labs Reviewed  PHENYTOIN LEVEL, TOTAL   No results found.   No diagnosis found.    MDM  Patient Dilantin level noted here. She was instructed to hold her next dose of her Dilantin and follow with her Dr. for repeat level. No seizure activity reported here. No ataxia or gait disturbance. She has no vomiting. She is stable for discharge        Toy Baker, MD 06/24/12 2320

## 2012-06-24 NOTE — ED Notes (Signed)
Called lab to check on results of phenytoin levels. Per lab: that test is done at [facility] and usually takes over an hour to get results back.

## 2012-06-25 ENCOUNTER — Encounter (HOSPITAL_COMMUNITY): Payer: Self-pay | Admitting: Emergency Medicine

## 2012-06-28 ENCOUNTER — Inpatient Hospital Stay (HOSPITAL_COMMUNITY): Payer: Medicare Other

## 2012-06-28 ENCOUNTER — Inpatient Hospital Stay (HOSPITAL_COMMUNITY)
Admission: EM | Admit: 2012-06-28 | Discharge: 2012-07-01 | DRG: 101 | Disposition: A | Discharge status: Home / Self Care | Payer: Medicare Other | Attending: Internal Medicine | Admitting: Internal Medicine

## 2012-06-28 ENCOUNTER — Encounter (HOSPITAL_COMMUNITY): Payer: Self-pay | Admitting: Emergency Medicine

## 2012-06-28 DIAGNOSIS — D696 Thrombocytopenia, unspecified: Secondary | ICD-10-CM

## 2012-06-28 DIAGNOSIS — Z91199 Patient's noncompliance with other medical treatment and regimen due to unspecified reason: Secondary | ICD-10-CM

## 2012-06-28 DIAGNOSIS — Z91148 Patient's other noncompliance with medication regimen for other reason: Secondary | ICD-10-CM

## 2012-06-28 DIAGNOSIS — Z79899 Other long term (current) drug therapy: Secondary | ICD-10-CM

## 2012-06-28 DIAGNOSIS — R509 Fever, unspecified: Secondary | ICD-10-CM

## 2012-06-28 DIAGNOSIS — R0789 Other chest pain: Secondary | ICD-10-CM

## 2012-06-28 DIAGNOSIS — R569 Unspecified convulsions: Secondary | ICD-10-CM

## 2012-06-28 DIAGNOSIS — I959 Hypotension, unspecified: Secondary | ICD-10-CM

## 2012-06-28 DIAGNOSIS — F17200 Nicotine dependence, unspecified, uncomplicated: Secondary | ICD-10-CM

## 2012-06-28 DIAGNOSIS — Z888 Allergy status to other drugs, medicaments and biological substances status: Secondary | ICD-10-CM

## 2012-06-28 DIAGNOSIS — Z9114 Patient's other noncompliance with medication regimen: Secondary | ICD-10-CM

## 2012-06-28 DIAGNOSIS — G40909 Epilepsy, unspecified, not intractable, without status epilepticus: Secondary | ICD-10-CM

## 2012-06-28 DIAGNOSIS — G40802 Other epilepsy, not intractable, without status epilepticus: Principal | ICD-10-CM | POA: Diagnosis present

## 2012-06-28 DIAGNOSIS — F439 Reaction to severe stress, unspecified: Secondary | ICD-10-CM

## 2012-06-28 DIAGNOSIS — I629 Nontraumatic intracranial hemorrhage, unspecified: Secondary | ICD-10-CM

## 2012-06-28 DIAGNOSIS — J45909 Unspecified asthma, uncomplicated: Secondary | ICD-10-CM | POA: Diagnosis present

## 2012-06-28 DIAGNOSIS — S0291XA Unspecified fracture of skull, initial encounter for closed fracture: Secondary | ICD-10-CM

## 2012-06-28 DIAGNOSIS — Z9119 Patient's noncompliance with other medical treatment and regimen: Secondary | ICD-10-CM

## 2012-06-28 DIAGNOSIS — T420X1A Poisoning by hydantoin derivatives, accidental (unintentional), initial encounter: Secondary | ICD-10-CM

## 2012-06-28 LAB — PHENYTOIN LEVEL, TOTAL: Phenytoin Lvl: 10.2 ug/mL (ref 10.0–20.0)

## 2012-06-28 LAB — URINALYSIS, ROUTINE W REFLEX MICROSCOPIC
Bilirubin Urine: NEGATIVE
Leukocytes, UA: NEGATIVE
Nitrite: NEGATIVE
Specific Gravity, Urine: 1.018 (ref 1.005–1.030)
Urobilinogen, UA: 0.2 mg/dL (ref 0.0–1.0)

## 2012-06-28 LAB — CBC
HCT: 37.2 % (ref 36.0–46.0)
Hemoglobin: 12.5 g/dL (ref 12.0–15.0)
MCHC: 33.6 g/dL (ref 30.0–36.0)
MCV: 99.7 fL (ref 78.0–100.0)
RDW: 12.5 % (ref 11.5–15.5)

## 2012-06-28 LAB — CBC WITH DIFFERENTIAL/PLATELET
Basophils Absolute: 0 10*3/uL (ref 0.0–0.1)
Basophils Relative: 0 % (ref 0–1)
Eosinophils Absolute: 0 10*3/uL (ref 0.0–0.7)
MCH: 34.4 pg — ABNORMAL HIGH (ref 26.0–34.0)
MCHC: 33.8 g/dL (ref 30.0–36.0)
Neutro Abs: 2 10*3/uL (ref 1.7–7.7)
Neutrophils Relative %: 55 % (ref 43–77)
Platelets: 156 10*3/uL (ref 150–400)

## 2012-06-28 LAB — URINE MICROSCOPIC-ADD ON

## 2012-06-28 LAB — ETHANOL: Alcohol, Ethyl (B): <11 mg/dL (ref 0–11)

## 2012-06-28 LAB — BASIC METABOLIC PANEL
Chloride: 104 mEq/L (ref 96–112)
GFR calc Af Amer: >90 mL/min (ref >90)
GFR calc non Af Amer: >90 mL/min (ref >90)
Potassium: 4.5 mEq/L (ref 3.5–5.1)
Sodium: 141 mEq/L (ref 135–145)

## 2012-06-28 LAB — MAGNESIUM: Magnesium: 2.1 mg/dL (ref 1.5–2.5)

## 2012-06-28 LAB — PHOSPHORUS: Phosphorus: 3.3 mg/dL (ref 2.3–4.6)

## 2012-06-28 LAB — CK: Total CK: 115 U/L (ref 7–177)

## 2012-06-28 LAB — RAPID URINE DRUG SCREEN, HOSP PERFORMED: Opiates: NOT DETECTED

## 2012-06-28 MED ORDER — LACOSAMIDE 50 MG PO TABS
200.0000 mg | ORAL_TABLET | Freq: Two times a day (BID) | ORAL | Status: DC
  Administered 2012-06-28 – 2012-06-30 (×5): 200 mg via ORAL
  Filled 2012-06-28 (×5): qty 4

## 2012-06-28 MED ORDER — ACETAMINOPHEN 325 MG PO TABS
650.0000 mg | ORAL_TABLET | Freq: Four times a day (QID) | ORAL | Status: DC | PRN
  Administered 2012-06-28: 650 mg via ORAL
  Filled 2012-06-28: qty 2

## 2012-06-28 MED ORDER — SODIUM CHLORIDE 0.9 % IV SOLN
INTRAVENOUS | Status: DC
  Administered 2012-06-28: 21:00:00 via INTRAVENOUS

## 2012-06-28 MED ORDER — PHENYTOIN SODIUM 50 MG/ML IJ SOLN
500.0000 mg | Freq: Once | INTRAMUSCULAR | Status: AC
  Administered 2012-06-28: 500 mg via INTRAVENOUS
  Filled 2012-06-28: qty 10

## 2012-06-28 MED ORDER — LORAZEPAM 2 MG/ML IJ SOLN: 1.0000 mg | INTRAMUSCULAR | Status: DC | PRN

## 2012-06-28 MED ORDER — ENOXAPARIN SODIUM 40 MG/0.4ML ~~LOC~~ SOLN
40.0000 mg | SUBCUTANEOUS | Status: DC
  Administered 2012-06-28 – 2012-06-30 (×3): 40 mg via SUBCUTANEOUS
  Filled 2012-06-28 (×4): qty 0.4

## 2012-06-28 MED ORDER — PHENYTOIN 50 MG PO CHEW
50.0000 mg | CHEWABLE_TABLET | Freq: Two times a day (BID) | ORAL | Status: DC
  Administered 2012-06-29 – 2012-06-30 (×4): 50 mg via ORAL
  Filled 2012-06-28 (×6): qty 1

## 2012-06-28 MED ORDER — ONDANSETRON HCL 4 MG PO TABS: 4.0000 mg | ORAL_TABLET | Freq: Four times a day (QID) | ORAL | Status: DC | PRN

## 2012-06-28 MED ORDER — ONDANSETRON HCL 4 MG/2ML IJ SOLN: 4.0000 mg | Freq: Three times a day (TID) | INTRAMUSCULAR | Status: DC | PRN

## 2012-06-28 MED ORDER — ONDANSETRON HCL 4 MG/2ML IJ SOLN: 4.0000 mg | Freq: Four times a day (QID) | INTRAMUSCULAR | Status: DC | PRN

## 2012-06-28 MED ORDER — SODIUM CHLORIDE 0.9 % IV SOLN
INTRAVENOUS | Status: DC
  Administered 2012-06-28: 18:00:00 via INTRAVENOUS

## 2012-06-28 MED ORDER — SODIUM CHLORIDE 0.9 % IV SOLN
200.0000 mg | Freq: Once | INTRAVENOUS | Status: DC
  Filled 2012-06-28: qty 20

## 2012-06-28 MED ORDER — PHENYTOIN SODIUM EXTENDED 100 MG PO CAPS
100.0000 mg | ORAL_CAPSULE | Freq: Two times a day (BID) | ORAL | Status: DC
  Filled 2012-06-28: qty 1

## 2012-06-28 MED ORDER — PHENYTOIN SODIUM EXTENDED 100 MG PO CAPS
100.0000 mg | ORAL_CAPSULE | Freq: Two times a day (BID) | ORAL | Status: DC
  Administered 2012-06-29 – 2012-06-30 (×4): 100 mg via ORAL
  Filled 2012-06-28 (×6): qty 1

## 2012-06-28 MED ORDER — ACETAMINOPHEN 650 MG RE SUPP: 650.0000 mg | Freq: Four times a day (QID) | RECTAL | Status: DC | PRN

## 2012-06-28 MED ORDER — ALBUTEROL SULFATE HFA 108 (90 BASE) MCG/ACT IN AERS: 2.0000 | INHALATION_SPRAY | RESPIRATORY_TRACT | Status: DC | PRN

## 2012-06-28 MED ORDER — LORAZEPAM 2 MG/ML IJ SOLN
INTRAMUSCULAR | Status: AC
  Administered 2012-06-28: 1 mg
  Filled 2012-06-28: qty 1

## 2012-06-28 MED ORDER — HYDROCODONE-ACETAMINOPHEN 5-325 MG PO TABS: 1.0000 | ORAL_TABLET | Freq: Four times a day (QID) | ORAL | Status: DC | PRN

## 2012-06-28 MED ORDER — LACOSAMIDE 100 MG PO TABS: 100.0000 mg | ORAL_TABLET | Freq: Every day | ORAL | Status: DC

## 2012-06-28 NOTE — ED Notes (Signed)
Dr. Reynolds at bedside.

## 2012-06-28 NOTE — H&P (Signed)
[facility]          History and Physical    PCP:   Pearson Grippe, MD   Chief Complaint:  seizures  HPI: [name] is a 56/F with PMH of seizures following prior h/o intracranial bleed and surgery  24 years ago, had been taking dilantin and Vimpat most recently. Patient currently lethargic and unable to give reliable history and hence information obtained from her son. She had a seizure on Thursday and went to ER at [facility] long, her dilantin level was supra-therapeutic and was asked to stop her dilantin for 2 days then resume it. Her son thinks she resumed her dilantin yesterday or today. She had a seizure this morning around 8 am and then at 2pm when she took her mother to the doctors office then was brought by EMS to the ER. In the ER had another seizure. All these seizures have been generalized tonic clonic with loss of consciousness. No bowel bladder incontinence noted  Allergies:   Allergies  Allergen Reactions  . Codeine Nausea And Vomiting  . Keppra (Levetiracetam) Other (See Comments)    Causes seizures.       Past Medical History  Diagnosis Date  . Seizures   . DVT (deep venous thrombosis)   . Asthma   . Cerebral hemorrhage     Past Surgical History  Procedure Date  . Cesarean section   . Brain surgery     Prior to Admission medications   Medication Sig Start Date End Date Taking? Authorizing Provider  albuterol (PROVENTIL HFA;VENTOLIN HFA) 108 (90 BASE) MCG/ACT inhaler Inhale 2 puffs into the lungs every 4 (four) hours as needed. For shortness of breath   Yes Historical Provider, MD  HYDROcodone-acetaminophen (VICODIN) 5-500 MG per tablet Take 0.5 tablets by mouth every 4 (four) hours as needed. For pain 03/05/12  Yes Osvaldo Shipper, MD  Lacosamide (VIMPAT) 100 MG TABS Take 100 mg by mouth at bedtime.    Yes Historical Provider, MD  phenytoin (DILANTIN) 100 MG ER capsule Take 100 mg by mouth 2 (two) times daily.  03/03/12  Yes Osvaldo Shipper, MD      Social History: she lives with her son and daughter in law.  reports that she has been smoking.  She does not have any smokeless tobacco history on file. She reports that she does not drink alcohol or use illicit drugs.  No family history on file.  Review of Systems:  Constitutional: Denies fever, chills, diaphoresis, appetite change and fatigue.  HEENT: Denies photophobia, eye pain, redness, hearing loss, ear pain, congestion, sore throat, rhinorrhea, sneezing, mouth sores, trouble swallowing, neck pain, neck stiffness and tinnitus.   Respiratory: Denies SOB, DOE, cough, chest tightness,  and wheezing.   Cardiovascular: Denies chest pain, palpitations and leg swelling.  Gastrointestinal: Denies nausea, vomiting, abdominal pain, diarrhea, constipation, blood in stool and abdominal distention.  Genitourinary: Denies dysuria, urgency, frequency, hematuria, flank pain and difficulty urinating.  Musculoskeletal: Denies myalgias, back pain, joint swelling, arthralgias and gait problem.  Skin: Denies pallor, rash and wound.  Neurological: Denies dizziness, seizures, syncope, weakness, light-headedness, numbness and headaches.  Hematological: Denies adenopathy. Easy bruising, personal or family bleeding history  Psychiatric/Behavioral: Denies suicidal ideation, mood changes, confusion, nervousness, sleep disturbance and agitation   Physical Exam: Blood pressure 97/59, pulse 84, temperature 98.6 F (37 C), temperature source Oral, resp. rate 20, SpO2 94.00%. Gen: Lethargic , arousible, answers some questions but falls off to sleep HEENT: PERRLA< EOMI CVS: S1S2/RRR Lungs:  poor air movement bilaterally Abd: soft, NT, BS present EXt: no edema c/c Neuro: lethargic, increased tone in all extremities, no localising signs SKin: no rashes  Labs on Admission:  Results for orders placed during the hospital encounter of 06/28/12 (from the past 48 hour(s))  PHENYTOIN LEVEL, TOTAL     Status:  Normal   Collection Time   06/28/12  3:17 PM      Component Value Range Comment   Phenytoin Lvl 10.2  10.0 - 20.0 ug/mL   URINALYSIS, ROUTINE W REFLEX MICROSCOPIC     Status: Abnormal   Collection Time   06/28/12  4:37 PM      Component Value Range Comment   Color, Urine YELLOW  YELLOW    APPearance CLOUDY (*) CLEAR    Specific Gravity, Urine 1.018  1.005 - 1.030    pH 7.0  5.0 - 8.0    Glucose, UA NEGATIVE  NEGATIVE mg/dL    Hgb urine dipstick NEGATIVE  NEGATIVE    Bilirubin Urine NEGATIVE  NEGATIVE    Ketones, ur NEGATIVE  NEGATIVE mg/dL    Protein, ur 30 (*) NEGATIVE mg/dL    Urobilinogen, UA 0.2  0.0 - 1.0 mg/dL    Nitrite NEGATIVE  NEGATIVE    Leukocytes, UA NEGATIVE  NEGATIVE   URINE MICROSCOPIC-ADD ON     Status: Abnormal   Collection Time   06/28/12  4:37 PM      Component Value Range Comment   Squamous Epithelial / LPF FEW (*) RARE    WBC, UA 0-2  <3 WBC/hpf    Bacteria, UA RARE  RARE    Casts HYALINE CASTS (*) NEGATIVE    Urine-Other MUCOUS PRESENT   AMORPHOUS URATES/PHOSPHATES  CBC WITH DIFFERENTIAL     Status: Abnormal   Collection Time   06/28/12  4:44 PM      Component Value Range Comment   WBC 3.6 (*) 4.0 - 10.5 K/uL    RBC 3.90  3.87 - 5.11 MIL/uL    Hemoglobin 13.4  12.0 - 15.0 g/dL    HCT 11.9  14.7 - 82.9 %    MCV 101.5 (*) 78.0 - 100.0 fL    MCH 34.4 (*) 26.0 - 34.0 pg    MCHC 33.8  30.0 - 36.0 g/dL    RDW 56.2  13.0 - 86.5 %    Platelets 156  150 - 400 K/uL    Neutrophils Relative 55  43 - 77 %    Neutro Abs 2.0  1.7 - 7.7 K/uL    Lymphocytes Relative 40  12 - 46 %    Lymphs Abs 1.4  0.7 - 4.0 K/uL    Monocytes Relative 4  3 - 12 %    Monocytes Absolute 0.2  0.1 - 1.0 K/uL    Eosinophils Relative 1  0 - 5 %    Eosinophils Absolute 0.0  0.0 - 0.7 K/uL    Basophils Relative 0  0 - 1 %    Basophils Absolute 0.0  0.0 - 0.1 K/uL   BASIC METABOLIC PANEL     Status: Normal   Collection Time   06/28/12  4:44 PM      Component Value Range Comment    Sodium 141  135 - 145 mEq/L    Potassium 4.5  3.5 - 5.1 mEq/L HEMOLYSIS AT THIS LEVEL MAY AFFECT RESULT   Chloride 104  96 - 112 mEq/L    CO2 25  19 - 32 mEq/L  Glucose, Bld 92  70 - 99 mg/dL    BUN 9  6 - 23 mg/dL    Creatinine, Ser 1.61  0.50 - 1.10 mg/dL    Calcium 9.2  8.4 - 09.6 mg/dL    GFR calc non Af Amer >90  >90 mL/min    GFR calc Af Amer >90  >90 mL/min     Radiological Exams on Admission: Ct Head Wo Contrast  06/28/2012  *RADIOLOGY REPORT*  Clinical Data: Seizure.  CT HEAD WITHOUT CONTRAST  Technique:  Contiguous axial images were obtained from the base of the skull through the vertex without contrast.  Comparison: Head CT 03/02/2012.  Findings: Large area of encephalomalacia in the right parieto- occipital region is unchanged.  No acute intracranial abnormality. Specifically, no signs of acute intracranial hemorrhage, no definite region to suggest acute/subacute cerebral ischemia, no mass, mass effect or hydrocephalous.  Postoperative changes of right parietal craniotomy.  No acute displaced skull fractures are identified.  Visualized paranasal sinuses and mastoids pneumatized, with exception of a small left mastoid effusion which appears to be chronic.  IMPRESSION: 1.  No acute intracranial abnormalities. 2.  The appearance of the brain is similar to prior examinations with postoperative changes and large area of encephalomalacia in the right lateral occipital regions, as above. 3.  Small chronic left mastoid effusion is unchanged.   Original Report Authenticated By: Trudie Reed, M.D.     Assessment/Plan  1. Breakthrough seizures: Being loaded with Vimpat and bolus of Dilantin IV, followed by PO Dilantin and Vimpat per Neuro recommendations, appreciate Dr.Reynolds consult. Dilantin level in am Check Mag, Phos, CK CT head Diet when mentation better, seizure precautions Ativan IV PRN  2. Tobacco use: will be counseled  DVT proph: lovenox  FULL CODE Family  communication: discussed with son at bedside Disposition: inpatient  Generalized seizures   Time Spent on Admission:  [facility] Pager: [phone] 06/28/2012, 6:23 PM

## 2012-06-28 NOTE — ED Provider Notes (Signed)
SHAWANNA ZANDERS is a 56 y.o. female who reports to ED for evaluation of seizure. She has seizure today witnessed by her mother, consisting of a 1-2 minute tonic-clonic seizure followed by postictal state. She was seen 2 days ago, and diagnosed with a seizure associated with elevated Dilantin level. She reportedly held her Dilantin in the last 2 days. She reportedly is taking her of Vimpat.   During evaluation. The patient had a tonic-clonic seizure with right eye deviation. She was treated with Ativan, and the seizure abated. She remained, postictal after that (16:30).  Patient with recent seizure activity, associated, both with high, and low Dilantin level. She'll be given a bolus of Dilantin, 500 mg. Consultation with neuro hospitalist, regarding best approach to treat the recurrent seizures.   Medical screening examination/treatment/procedure(s) were conducted as a shared visit with non-physician practitioner(s) and myself.  I personally evaluated the patient during the encounter  Flint Melter, MD 06/28/12 2325

## 2012-06-28 NOTE — Consult Note (Signed)
Reason for Consult:Seizures Referring Physician: Effie Shy   CC: Seizures  HPI: [name] is an 56 y.o. female with a long history of seizures.  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  Has been on Phenobarbital, Mysoline, Depakote and Keppra in the past.  This summer had a fall that resulted in a skull fracture.  Dilantin level was supratherapeutic at that time.  Per report of family the patient has been undergoing medicine transition since that time.  No one is quite sure how much of the Vimpat and Dilantin she is taking.  She was seen at [facility] for seizures four days a go and her Dilantin level was 28.  She was told to stop her Dilantin for 2 days.  The doses she has recorded from that visit do not correspond with the kind and number of pills she has with her today.  She has 200mg  tablets of Vimpat-filled on October 10 and Dilantin that she has in excess.  She does take brand name Dilantin.   Seizures are described as generalized tonic-clonic seizures.  She had one this morning at 8AM, another about 2PM and another in the ED.  Dilantin level today is 10.2.  Past Medical History  Diagnosis Date  . Seizures   . DVT (deep venous thrombosis)   . Asthma   . Cerebral hemorrhage     Past Surgical History  Procedure Date  . Cesarean section   . Brain surgery     Family history: No family history of seizures.  One son alive and well.    Social History:  reports that she has been smoking.  She does not have any smokeless tobacco history on file. She reports that she does not drink alcohol or use illicit drugs.  Allergies  Allergen Reactions  . Codeine Nausea And Vomiting  . Keppra (Levetiracetam) Other (See Comments)    Causes seizures.     Medications: I have reviewed the patient's current medications. Prior to Admission:   Current outpatient prescriptions:albuterol (PROVENTIL HFA;VENTOLIN HFA) 108 (90 BASE) MCG/ACT  inhaler, Inhale 2 puffs into the lungs every 4 (four) hours as needed. For shortness of breath, Disp: , Rfl: ;  HYDROcodone-acetaminophen (VICODIN) 5-500 MG per tablet, Take 0.5 tablets by mouth every 4 (four) hours as needed. For pain, Disp: , Rfl: ;  Lacosamide (VIMPAT) 100 MG TABS, Take 100 mg by mouth at bedtime. , Disp: , Rfl:  phenytoin (DILANTIN) 100 MG ER capsule, Take 100 mg by mouth 2 (two) times daily. , Disp: , Rfl:   ROS: History obtained from family  General ROS: negative for - chills, fatigue, fever, night sweats, weight gain or weight loss Psychological ROS: negative for - behavioral disorder, hallucinations, memory difficulties, mood swings or suicidal ideation Ophthalmic ROS: negative for - blurry vision, double vision, eye pain or loss of vision ENT ROS: negative for - epistaxis, nasal discharge, oral lesions, sore throat, tinnitus or vertigo Allergy and Immunology ROS: negative for - hives or itchy/watery eyes Hematological and Lymphatic ROS: negative for - bleeding problems, bruising or swollen lymph nodes Endocrine ROS: negative for - galactorrhea, hair pattern changes, polydipsia/polyuria or temperature intolerance Respiratory ROS: negative for - cough, hemoptysis, shortness of breath or wheezing Cardiovascular ROS: negative for - chest pain, dyspnea on exertion, edema or irregular heartbeat Gastrointestinal ROS: negative for - abdominal pain, diarrhea, hematemesis, nausea/vomiting or stool incontinence Genito-Urinary ROS: negative for - dysuria, hematuria, incontinence or urinary frequency/urgency  Musculoskeletal ROS: negative for - joint swelling or muscular weakness Neurological ROS: as noted in HPI Dermatological ROS: negative for rash and skin lesion changes  Physical Examination: Blood pressure 97/59, pulse 84, temperature 98.6 F (37 C), temperature source Oral, resp. rate 20, SpO2 94.00%.  Neurologic Examination Mental Status: Lethargic.  Minimally  cooperative.  Follows some simple commands.  Speech slurred and minimal.   Cranial Nerves: II: Discs flat bilaterally; blinks to bilateral confrontation, pupils equal, round, reactive to light and accommodation III,IV, VI: ptosis not present, extra-ocular motions intact to oculocephalic maneuver V,VII: smile symmetric, facial light touch sensation normal bilaterally VIII: hearing normal bilaterally IX,X: gag reflex present XI: bilateral shoulder shrug XII: midline tongue extension Motor: Moves all extremities against gravity. Does not follow commands enough for formal testing.   Tone and bulk:normal tone throughout; no atrophy noted Sensory: Pinprick and light touch intact throughout, bilaterally Deep Tendon Reflexes: 2+ and symmetric throughout Plantars: Right: upgoing   Left: upgoing Cerebellar: Unable to perform Gait: unable to perform CV: pulses palpable throughout     Laboratory Studies:   Basic Metabolic Panel:  Lab 06/28/12 4098 06/24/12 2147  NA 141 140  K 4.5 4.3  CL 104 107  CO2 25 --  GLUCOSE 92 91  BUN 9 6  CREATININE 0.60 0.70  CALCIUM 9.2 --  MG -- --  PHOS -- --    Liver Function Tests: No results found for this basename: AST:5,ALT:5,ALKPHOS:5,BILITOT:5,PROT:5,ALBUMIN:5 in the last 168 hours No results found for this basename: LIPASE:5,AMYLASE:5 in the last 168 hours No results found for this basename: AMMONIA:3 in the last 168 hours  CBC:  Lab 06/28/12 1644 06/24/12 2147  WBC 3.6* --  NEUTROABS 2.0 --  HGB 13.4 12.9  HCT 39.6 38.0  MCV 101.5* --  PLT 156 --    Cardiac Enzymes: No results found for this basename: CKTOTAL:5,CKMB:5,CKMBINDEX:5,TROPONINI:5 in the last 168 hours  BNP: No components found with this basename: POCBNP:5  CBG: No results found for this basename: GLUCAP:5 in the last 168 hours  Microbiology: Results for orders placed during the hospital encounter of 02/27/12  MRSA PCR SCREENING     Status: Normal   Collection  Time   02/28/12  2:03 AM      Component Value Range Status Comment   MRSA by PCR NEGATIVE  NEGATIVE Final     Coagulation Studies: No results found for this basename: LABPROT:5,INR:5 in the last 72 hours  Urinalysis:  Lab 06/28/12 1637  COLORURINE YELLOW  LABSPEC 1.018  PHURINE 7.0  GLUCOSEU NEGATIVE  HGBUR NEGATIVE  BILIRUBINUR NEGATIVE  KETONESUR NEGATIVE  PROTEINUR 30*  UROBILINOGEN 0.2  NITRITE NEGATIVE  LEUKOCYTESUR NEGATIVE    Lipid Panel:  No results found for this basename: chol, trig, hdl, cholhdl, vldl, ldlcalc    HgbA1C:  Lab Results  Component Value Date   HGBA1C 5.3 03/02/2012    Urine Drug Screen:     Component Value Date/Time   LABOPIA NONE DETECTED 05/26/2007 2050   COCAINSCRNUR NONE DETECTED 05/26/2007 2050   LABBENZ POSITIVE* 05/26/2007 2050   AMPHETMU NONE DETECTED 05/26/2007 2050   THCU NONE DETECTED 05/26/2007 2050   LABBARB  Value: NONE DETECTED        DRUG SCREEN FOR MEDICAL PURPOSES ONLY.  IF CONFIRMATION IS NEEDED FOR ANY PURPOSE, NOTIFY LAB WITHIN 5 DAYS. 05/26/2007 2050    Alcohol Level: No results found for this basename: ETH:2 in the last 168 hours  Imaging: No results found.  Assessment/Plan: 56 year old female with a long history of seizures that has had recent breakthrough seizures.  Patient is unable to give history and family is not sure exactly what doses she is taking.  It seems there is a regimen in progress that will take her off of the Dilantin and place her on Vimpat.  She was seen in the ED fourdays ago and had a supratherapeutic level.  She had a breakthrough seizure that day.  She was told to hold her Dilantin.  She has had multiple breakthrough seizures today with a low therapeutic Dilantin level.  Recommendations: 1.  Since it is unclear what the patient is taking would give 500mg  IV of Dilantin now, then start a po maintenance tomorrow of 150mg  BID 2.  200mg  IV Vimpat to be given now and start maintenance of 200mg  BID  tomorrow 3.  Head CT without contrast today 4.  Serum magnesium and Phsphorus 4.  Seizure precautions 5.  Patient has not returned to baseline mental status.  Would benefit from a step down bed.  If not at baseline by the morning will require an EEG. 6.  Dilantin level in the morning.   7.  Records to be obtained from Dr. Gaye Alken in the morning  Thana Farr, MD [facility] [phone] 06/28/2012, 5:33 PM

## 2012-06-28 NOTE — ED Notes (Signed)
Pt states she has been doing good on the amount of seizures she was having until recently when she developed a cold. Last week pt states she was given vicodin and albuterol inhaler to help with cough.

## 2012-06-28 NOTE — ED Notes (Signed)
Per [facility] Pt had a seizure while with someone at the dr office that lasted for . Post ictal for 2-35mins and incontinent of urine and alert and oriented by the time EMS arrived. Hx of seizures, takes dilantin but was taken off of dilantin for two days by dr wood due to elevated dilantin level. Pt began taking dilantin again this am.

## 2012-06-28 NOTE — ED Provider Notes (Signed)
History     CSN: [id number]  Arrival date & time 06/28/12  1426   First MD Initiated Contact with Patient 06/28/12 1501      Chief Complaint  Patient presents with  . Seizures    (Consider location/radiation/quality/duration/timing/severity/associated sxs/prior treatment) HPI  56 year old female with history of seizure currently taking Dilantin presents for evaluations of seizure. Patient reports she has been seizure-free for several months, but recently she had a cold and was taking hydrocodone cough medication when she had her last seizure 4 days ago. At that time her Dilantin level was elevated and she has stopped taking it as recommended by her Dr. for the past 2 days. She resume her Dilantin today. Her cough has improved and she has stopped taking hydrocodone.  While taking her mom to the doctor's office she had another seizure episode. Procedure was a witness and lasted for a minute, follows with a post ictal state.  Patient reports she did have urinary incontinence but denies tongue biting. She denies falling. Currently denies any headache, chest pain, back pain, abdominal pain, numbness or weakness. She attributed a seizure to not being sleeping as usual for the past few days.  Denies alcohol or rec drug use.  No urinary complaints.    Past Medical History  Diagnosis Date  . Seizures   . DVT (deep venous thrombosis)   . Asthma   . Cerebral hemorrhage     Past Surgical History  Procedure Date  . Cesarean section   . Brain surgery     No family history on file.  History  Substance Use Topics  . Smoking status: Current Every Day Smoker  . Smokeless tobacco: Not on file  . Alcohol Use: No    OB History    Grav Para Term Preterm Abortions TAB SAB Ect Mult Living                  Review of Systems  All other systems reviewed and are negative.    Allergies  Codeine and Keppra  Home Medications   Current Outpatient Rx  Name  Route  Sig  Dispense  Refill  .  HYDROCODONE-ACETAMINOPHEN 5-500 MG PO TABS   Oral   Take 0.5 tablets by mouth every 4 (four) hours as needed. For pain         . LACOSAMIDE 100 MG PO TABS   Oral   Take 100 mg by mouth at bedtime.          Marland Kitchen PHENYTOIN SODIUM EXTENDED 100 MG PO CAPS   Oral   Take 200 mg by mouth 2 (two) times daily.           BP 97/59  Pulse 84  Temp 98.6 F (37 C) (Oral)  Resp 20  SpO2 94%  Physical Exam  Nursing note and vitals reviewed. Constitutional: She is oriented to person, place, and time. No distress.       Thin-appearing female appears older than her stated age  HENT:  Head: Normocephalic and atraumatic.  Mouth/Throat: Oropharynx is clear and moist.       No tongue biting  Eyes: Conjunctivae normal and EOM are normal. Pupils are equal, round, and reactive to light.  Neck: Normal range of motion. Neck supple.  Cardiovascular: Normal rate and regular rhythm.  Exam reveals no gallop and no friction rub.   No murmur heard. Pulmonary/Chest: Effort normal. She has no rales. She exhibits no tenderness.       Mild expiratory  wheezes  Abdominal: Soft. There is no tenderness.  Musculoskeletal: Normal range of motion. She exhibits no edema and no tenderness.  Neurological: She is alert and oriented to person, place, and time. She displays normal reflexes. No cranial nerve deficit. She exhibits normal muscle tone. Coordination normal.       Normal gait  Skin: Skin is warm. No rash noted.    ED Course  Procedures (including critical care time)  Labs Reviewed - No data to display No results found.   No diagnosis found.  Results for orders placed during the hospital encounter of 06/28/12  PHENYTOIN LEVEL, TOTAL      Component Value Range   Phenytoin Lvl 10.2  10.0 - 20.0 ug/mL  CBC WITH DIFFERENTIAL      Component Value Range   WBC 3.6 (*) 4.0 - 10.5 K/uL   RBC 3.90  3.87 - 5.11 MIL/uL   Hemoglobin 13.4  12.0 - 15.0 g/dL   HCT 29.5  62.1 - 30.8 %   MCV 101.5 (*) 78.0 -  100.0 fL   MCH 34.4 (*) 26.0 - 34.0 pg   MCHC 33.8  30.0 - 36.0 g/dL   RDW 65.7  84.6 - 96.2 %   Platelets 156  150 - 400 K/uL   Neutrophils Relative 55  43 - 77 %   Neutro Abs 2.0  1.7 - 7.7 K/uL   Lymphocytes Relative 40  12 - 46 %   Lymphs Abs 1.4  0.7 - 4.0 K/uL   Monocytes Relative 4  3 - 12 %   Monocytes Absolute 0.2  0.1 - 1.0 K/uL   Eosinophils Relative 1  0 - 5 %   Eosinophils Absolute 0.0  0.0 - 0.7 K/uL   Basophils Relative 0  0 - 1 %   Basophils Absolute 0.0  0.0 - 0.1 K/uL  BASIC METABOLIC PANEL      Component Value Range   Sodium 141  135 - 145 mEq/L   Potassium 4.5  3.5 - 5.1 mEq/L   Chloride 104  96 - 112 mEq/L   CO2 25  19 - 32 mEq/L   Glucose, Bld 92  70 - 99 mg/dL   BUN 9  6 - 23 mg/dL   Creatinine, Ser 9.52  0.50 - 1.10 mg/dL   Calcium 9.2  8.4 - 84.1 mg/dL   GFR calc non Af Amer >90  >90 mL/min   GFR calc Af Amer >90  >90 mL/min   No results found.  1. seizure  MDM  Pt w/ hx of seizure who had recurrent seizure for the past few days.  Her dilantin was supratherapeutic when last checked a few days ago. Will recheck her level.  Pt otherwise in NAD, is A&Ox3.     Last dilantin level is 27.6.  Today  Her dilantin is 10.2.  However pt has 2 seizure episodes here in ED each lasting for 1-2 minutes.  Currently post ictal.  Ativan given via IV.  Seizure precaution implemented.  Family member's concern were addressed.  Care discussed with my attending who recommends given 500mg  IV of dilantin and will consult neurology on call.  Labs ordered.    4:25 PM i have consulted with neurology Dr. Thad Ranger who agrees to see pt in ED.     5:23 PM Dr. Thad Ranger have seen and evaluated pt in ED.  She recommend having pt admitted by [facility] for further management of her seizure.  Pt currently post-ictal.  Family at  bedside.    5:47 PM I have consulted [facility], Dr. Jomarie Longs, who agrees to admit pt to step down, Team 9, under her care.  She also recommend obtaining  EtOH and UDS.    CRITICAL CARE Performed by: Fayrene Helper   Total critical care time: 40 min  Critical care time was exclusive of separately billable procedures and treating other patients.  Critical care was necessary to treat or prevent imminent or life-threatening deterioration.  Critical care was time spent personally by me on the following activities: development of treatment plan with patient and/or surrogate as well as nursing, discussions with consultants, evaluation of patient's response to treatment, examination of patient, obtaining history from patient or surrogate, ordering and performing treatments and interventions, ordering and review of laboratory studies, ordering and review of radiographic studies, pulse oximetry and re-evaluation of patient's condition.  BP 97/59  Pulse 84  Temp 98.6 F (37 C) (Oral)  Resp 20  SpO2 94%  I have reviewed nursing notes and vital signs. I personally reviewed the imaging tests through PACS system  I reviewed available ER/hospitalization records thought the EMR  Results for orders placed during the hospital encounter of 06/28/12  PHENYTOIN LEVEL, TOTAL      Component Value Range   Phenytoin Lvl 10.2  10.0 - 20.0 ug/mL  CBC WITH DIFFERENTIAL      Component Value Range   WBC 3.6 (*) 4.0 - 10.5 K/uL   RBC 3.90  3.87 - 5.11 MIL/uL   Hemoglobin 13.4  12.0 - 15.0 g/dL   HCT 09.8  11.9 - 14.7 %   MCV 101.5 (*) 78.0 - 100.0 fL   MCH 34.4 (*) 26.0 - 34.0 pg   MCHC 33.8  30.0 - 36.0 g/dL   RDW 82.9  56.2 - 13.0 %   Platelets 156  150 - 400 K/uL   Neutrophils Relative 55  43 - 77 %   Neutro Abs 2.0  1.7 - 7.7 K/uL   Lymphocytes Relative 40  12 - 46 %   Lymphs Abs 1.4  0.7 - 4.0 K/uL   Monocytes Relative 4  3 - 12 %   Monocytes Absolute 0.2  0.1 - 1.0 K/uL   Eosinophils Relative 1  0 - 5 %   Eosinophils Absolute 0.0  0.0 - 0.7 K/uL   Basophils Relative 0  0 - 1 %   Basophils Absolute 0.0  0.0 - 0.1 K/uL  BASIC METABOLIC PANEL       Component Value Range   Sodium 141  135 - 145 mEq/L   Potassium 4.5  3.5 - 5.1 mEq/L   Chloride 104  96 - 112 mEq/L   CO2 25  19 - 32 mEq/L   Glucose, Bld 92  70 - 99 mg/dL   BUN 9  6 - 23 mg/dL   Creatinine, Ser 8.65  0.50 - 1.10 mg/dL   Calcium 9.2  8.4 - 78.4 mg/dL   GFR calc non Af Amer >90  >90 mL/min   GFR calc Af Amer >90  >90 mL/min  URINALYSIS, ROUTINE W REFLEX MICROSCOPIC      Component Value Range   Color, Urine YELLOW  YELLOW   APPearance CLOUDY (*) CLEAR   Specific Gravity, Urine 1.018  1.005 - 1.030   pH 7.0  5.0 - 8.0   Glucose, UA NEGATIVE  NEGATIVE mg/dL   Hgb urine dipstick NEGATIVE  NEGATIVE   Bilirubin Urine NEGATIVE  NEGATIVE   Ketones, ur NEGATIVE  NEGATIVE mg/dL   Protein, ur 30 (*) NEGATIVE mg/dL   Urobilinogen, UA 0.2  0.0 - 1.0 mg/dL   Nitrite NEGATIVE  NEGATIVE   Leukocytes, UA NEGATIVE  NEGATIVE  URINE MICROSCOPIC-ADD ON      Component Value Range   Squamous Epithelial / LPF FEW (*) RARE   WBC, UA 0-2  <3 WBC/hpf   Bacteria, UA RARE  RARE   Casts HYALINE CASTS (*) NEGATIVE   Urine-Other MUCOUS PRESENT        Fayrene Helper, PA-C 06/28/12 1749

## 2012-06-29 DIAGNOSIS — Z91148 Patient's other noncompliance with medication regimen for other reason: Secondary | ICD-10-CM

## 2012-06-29 DIAGNOSIS — Z9119 Patient's noncompliance with other medical treatment and regimen: Secondary | ICD-10-CM

## 2012-06-29 DIAGNOSIS — R509 Fever, unspecified: Secondary | ICD-10-CM

## 2012-06-29 DIAGNOSIS — F439 Reaction to severe stress, unspecified: Secondary | ICD-10-CM

## 2012-06-29 DIAGNOSIS — Z9114 Patient's other noncompliance with medication regimen: Secondary | ICD-10-CM

## 2012-06-29 DIAGNOSIS — G40909 Epilepsy, unspecified, not intractable, without status epilepticus: Secondary | ICD-10-CM | POA: Diagnosis present

## 2012-06-29 DIAGNOSIS — D696 Thrombocytopenia, unspecified: Secondary | ICD-10-CM | POA: Diagnosis present

## 2012-06-29 LAB — COMPREHENSIVE METABOLIC PANEL
ALT: 11 U/L (ref 0–35)
Alkaline Phosphatase: 66 U/L (ref 39–117)
BUN: 10 mg/dL (ref 6–23)
CO2: 29 mEq/L (ref 19–32)
Calcium: 8.4 mg/dL (ref 8.4–10.5)
GFR calc Af Amer: >90 mL/min (ref >90)
GFR calc non Af Amer: >90 mL/min (ref >90)
Glucose, Bld: 95 mg/dL (ref 70–99)
Potassium: 3.8 mEq/L (ref 3.5–5.1)
Total Protein: 5.5 g/dL — ABNORMAL LOW (ref 6.0–8.3)

## 2012-06-29 LAB — CBC
HCT: 37 % (ref 36.0–46.0)
Hemoglobin: 12.4 g/dL (ref 12.0–15.0)
MCH: 34.2 pg — ABNORMAL HIGH (ref 26.0–34.0)
MCHC: 33.5 g/dL (ref 30.0–36.0)
RBC: 3.63 MIL/uL — ABNORMAL LOW (ref 3.87–5.11)

## 2012-06-29 MED ORDER — SODIUM CHLORIDE 0.9 % IV SOLN
INTRAVENOUS | Status: DC
  Administered 2012-06-29: 20:00:00 via INTRAVENOUS

## 2012-06-29 NOTE — Plan of Care (Signed)
Problem: Phase I Progression Outcomes Goal: IV access obtained Outcome: Completed/Met Date Met:  06/29/12 Patient had IV access established when transferred to the floor. Goal: Pain controlled with appropriate interventions Outcome: Not Applicable Date Met:  06/29/12 Patient denies any pain. Goal: OOB as tolerated unless otherwise ordered Outcome: Progressing Patient is slightly unsteady on her feet; requires some standby assist when ambulating to the bathroom. Goal: Voiding-avoid urinary catheter unless indicated Outcome: Completed/Met Date Met:  06/29/12 Patient voiding without incident.

## 2012-06-29 NOTE — Progress Notes (Addendum)
Report called to nurse on 4 north. Patient without complaints and notified mother of transfer to [address] room 32

## 2012-06-29 NOTE — Progress Notes (Signed)
Subjective: Patient awake and alert today.  Is able to give me more information on her medications today.  After leaving [facility] she was advised to take no Dilantin for two days.  She was only on Vimpat at 200mg  qhs.  This concerned her and she was taking 100mg  twice a day of Dilantin despite advice.    Objective: Current vital signs: BP 94/58  Pulse 73  Temp 98.3 F (36.8 C) (Oral)  Resp 22  Ht 5\' 7"  (1.702 m)  Wt 58.1 kg (128 lb 1.4 oz)  BMI 20.06 kg/m2  SpO2 97% Vital signs in last 24 hours: Temp:  [98.3 F (36.8 C)-100.4 F (38 C)] 98.3 F (36.8 C) (11/26 0800) Pulse Rate:  [73-87] 73  (11/26 0330) Resp:  [20-25] 22  (11/26 0330) BP: (83-120)/(44-68) 94/58 mmHg (11/26 0330) SpO2:  [94 %-100 %] 97 % (11/26 0330) Weight:  [58.1 kg (128 lb 1.4 oz)] 58.1 kg (128 lb 1.4 oz) (11/25 1920)  Intake/Output from previous day: 11/25 0701 - 11/26 0700 In: 1460 [P.O.:360; I.V.:1100] Out: 1100 [Urine:1100] Intake/Output this shift: Total I/O In: -  Out: 600 [Urine:600] Nutritional status: General  Neurologic Exam: Mental Status: Alert, oriented, thought content appropriate.  Speech fluent without evidence of aphasia.  Able to follow 3 step commands without difficulty. Cranial Nerves: II: Discs flat bilaterally; Visual fields grossly normal, pupils equal, round, reactive to light and accommodation III,IV, VI: ptosis not present, extra-ocular motions intact bilaterally V,VII: smile symmetric, facial light touch sensation normal bilaterally VIII: hearing normal bilaterally IX,X: gag reflex present XI: bilateral shoulder shrug XII: midline tongue extension Motor: Right : Upper extremity   5/5    Left:     Upper extremity   5/5  Lower extremity   5/5     Lower extremity   5/5 Tone and bulk:normal tone throughout; no atrophy noted Sensory: Pinprick and light touch intact throughout, bilaterally Deep Tendon Reflexes: 2+ and symmetric throughout Plantars: Right: upgoing   Left:  upgoing Cerebellar: normal finger-to-nose and normal heel-to-shin test CV: pulses palpable throughout   Lab Results: Basic Metabolic Panel:  Lab 06/29/12 1610 06/28/12 2043 06/28/12 1808 06/28/12 1644 06/24/12 2147  NA 140 -- -- 141 140  K 3.8 -- -- 4.5 4.3  CL 107 -- -- 104 107  CO2 29 -- -- 25 --  GLUCOSE 95 -- -- 92 91  BUN 10 -- -- 9 6  CREATININE 0.68 0.62 -- 0.60 0.70  CALCIUM 8.4 -- -- 9.2 --  MG -- -- 2.1 -- --  PHOS -- -- 3.3 -- --    Liver Function Tests:  Lab 06/29/12 0510  AST 16  ALT 11  ALKPHOS 66  BILITOT 0.2*  PROT 5.5*  ALBUMIN 3.0*   No results found for this basename: LIPASE:5,AMYLASE:5 in the last 168 hours No results found for this basename: AMMONIA:3 in the last 168 hours  CBC:  Lab 06/29/12 0510 06/28/12 2043 06/28/12 1644 06/24/12 2147  WBC 4.2 5.1 3.6* --  NEUTROABS -- -- 2.0 --  HGB 12.4 12.5 13.4 12.9  HCT 37.0 37.2 39.6 38.0  MCV 101.9* 99.7 101.5* --  PLT 139* 141* 156 --    Cardiac Enzymes:  Lab 06/28/12 2043  CKTOTAL 115  CKMB --  CKMBINDEX --  TROPONINI --    Lipid Panel: No results found for this basename: CHOL:5,TRIG:5,HDL:5,CHOLHDL:5,VLDL:5,LDLCALC:5 in the last 168 hours  CBG: No results found for this basename: GLUCAP:5 in the last 168 hours  Microbiology: Results  for orders placed during the hospital encounter of 06/28/12  MRSA PCR SCREENING     Status: Normal   Collection Time   06/28/12  7:39 PM      Component Value Range Status Comment   MRSA by PCR NEGATIVE  NEGATIVE Final     Coagulation Studies: No results found for this basename: LABPROT:5,INR:5 in the last 72 hours  Imaging: Ct Head Wo Contrast  06/28/2012  *RADIOLOGY REPORT*  Clinical Data: Seizure.  CT HEAD WITHOUT CONTRAST  Technique:  Contiguous axial images were obtained from the base of the skull through the vertex without contrast.  Comparison: Head CT 03/02/2012.  Findings: Large area of encephalomalacia in the right parieto- occipital  region is unchanged.  No acute intracranial abnormality. Specifically, no signs of acute intracranial hemorrhage, no definite region to suggest acute/subacute cerebral ischemia, no mass, mass effect or hydrocephalous.  Postoperative changes of right parietal craniotomy.  No acute displaced skull fractures are identified.  Visualized paranasal sinuses and mastoids pneumatized, with exception of a small left mastoid effusion which appears to be chronic.  IMPRESSION: 1.  No acute intracranial abnormalities. 2.  The appearance of the brain is similar to prior examinations with postoperative changes and large area of encephalomalacia in the right lateral occipital regions, as above. 3.  Small chronic left mastoid effusion is unchanged.   Original Report Authenticated By: Trudie Reed, M.D.     Medications:  I have reviewed the patient's current medications. Scheduled:   . enoxaparin (LOVENOX) injection  40 mg Subcutaneous Q24H  . lacosamide (VIMPAT) IV  200 mg Intravenous Once  . lacosamide  200 mg Oral BID  . [COMPLETED] LORazepam      . [COMPLETED] phenytoin (DILANTIN) IV  500 mg Intravenous Once  . phenytoin  100 mg Oral BID  . phenytoin  50 mg Oral BID  . [COMPLETED] sodium chloride   Intravenous STAT  . [DISCONTINUED] Lacosamide  100 mg Oral QHS  . [DISCONTINUED] phenytoin  100 mg Oral BID    Assessment/Plan:  Patient Active Hospital Problem List: Seizure disorder (02/28/2012)   Assessment: Patient did well overnight with no further seizure activity noted.  Is back to baseline.  Dilantin level this morning of 12.9.   Plan:  1.  Continue Dilantin and Vimpat at current doses.  If patient remains stable today would consider discharge in the morning.                2.  Dilantin level in the morning.              3.  Will need follow up with Dr. Gaye Alken in one week.      LOS: 1 day   Thana Farr, MD [facility] [phone] 06/29/2012  8:36 AM

## 2012-06-29 NOTE — Plan of Care (Signed)
Problem: Phase II Progression Outcomes Goal: Tolerating diet when awake Outcome: Progressing Patient partaking of her meal trays; intake not per her norm according to patient.

## 2012-06-29 NOTE — Progress Notes (Addendum)
[facility] Progress Note [facility] TEAM 1 - Stepdown/ICU TEAM   LAJOYCE TAMURA NWG:[id number] DOB: 1956/01/19 DOA: 06/28/2012 PCP: Pearson Grippe, MD  Brief narrative: 56 year old female patient known history of seizures after sustaining intracerebral hemorrhage 24 years prior. She has been on multiple medications in the past. During the summer 2013 she had a fall that resulted in the skull fracture. At that time her Dilantin level was supratherapeutic. Since that time patient has been on them packed and Dilantin. She presented to the [facility] emergency department 4 days before this admission for complaint of seizure activity and her Dilantin level was supratherapeutic. She was instructed to hold her next dose of Dilantin and followup with her doctor for repeat level. The patient told her family that she was instructed to hold her Dilantin for 2 days then resume it. On the date of admission patient was noted by her son to have had seizure activity around 8 AM then another seizure at 2 PM. He took her mother to the physician's office and she was subsequently brought to the ER by EMS. The patient was unable to answer questions at presentation so the son was assisting with the history. He believed the mother had recently resumed the Dilantin but was unclear if it was resumed the day before admission or on the date of admission. While in the ER the patient had another generalized tonic-clonic seizure with loss of consciousness. There was no bowel or bladder incontinence noted. She was subsequently admitted to the step down unit for further monitoring and treatment.  Assessment/Plan: Principal Problem:  *Recurrent seizures/ Noncompliance with medication treatment due to underuse of medication *Appreciate neurology assistance *In further talking with the patient she had a complete misunderstanding of her discharge paperwork from the ER from the visit for 06/25/2012. She stopped both her Dilantin and  Vimpat for 24 hrs.  *No further seizures since admission and Dilantin level today is 12.9. Neurology recommends continuing Dilantin and Vimpat current doses and checking a Dilantin level on 06/30/2012.   *If she remains stable could consider discharge 06/30/12. She needs to followup with Dr. 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 *As above   Stress-personal *Patient endorses significant interpersonal stressors-emotional support given   Tobacco dependence *Discuss with patient the importance of cessation and supported patient and the fact she has decreased from 2 packs of cigarettes per day to less than one pack per day   Thrombocytopenia- chronic *Stable and within usual baseline  Fever With cough- likely viral bronchitis - hold off on Abx   DVT prophylaxis: Lovenox Code Status: Full Family Communication: Spoke with the patient Disposition Plan: Transfer to neurology floor  Consultants: Neurology  Procedures: None  Antibiotics: None  HPI/Subjective: Patient alert and without any specific complaints. Preadmission history clarified regarding patient's medications and social status.   Objective: Blood pressure 99/56, pulse 77, temperature 98.3 F (36.8 C), temperature source Oral, resp. rate 19, height 5\' 7"  (1.702 m), weight 58.1 kg (128 lb 1.4 oz), SpO2 99.00%.  Intake/Output Summary (Last 24 hours) at 06/29/12 1417 Last data filed at 06/29/12 1320  Gross per 24 hour  Intake   2220 ml  Output   2100 ml  Net    120 ml     Exam: General: No acute respiratory distress Lungs: Clear to auscultation bilaterally without wheezes or crackles Cardiovascular: Regular rate and rhythm without murmur gallop or rub normal S1 and S2 Abdomen: Nontender,  nondistended, soft, bowel sounds positive, no rebound, no ascites, no appreciable mass Musculoskeletal: No significant cyanosis, clubbing of bilateral lower  extremities Neurological: Alert and oriented x3, moves all extremities x4, exam non-focal  Data Reviewed: Basic Metabolic Panel:  Lab 06/29/12 4540 06/28/12 2043 06/28/12 1808 06/28/12 1644 06/24/12 2147  NA 140 -- -- 141 140  K 3.8 -- -- 4.5 4.3  CL 107 -- -- 104 107  CO2 29 -- -- 25 --  GLUCOSE 95 -- -- 92 91  BUN 10 -- -- 9 6  CREATININE 0.68 0.62 -- 0.60 0.70  CALCIUM 8.4 -- -- 9.2 --  MG -- -- 2.1 -- --  PHOS -- -- 3.3 -- --   Liver Function Tests:  Lab 06/29/12 0510  AST 16  ALT 11  ALKPHOS 66  BILITOT 0.2*  PROT 5.5*  ALBUMIN 3.0*   No results found for this basename: LIPASE:5,AMYLASE:5 in the last 168 hours No results found for this basename: AMMONIA:5 in the last 168 hours CBC:  Lab 06/29/12 0510 06/28/12 2043 06/28/12 1644 06/24/12 2147  WBC 4.2 5.1 3.6* --  NEUTROABS -- -- 2.0 --  HGB 12.4 12.5 13.4 12.9  HCT 37.0 37.2 39.6 38.0  MCV 101.9* 99.7 101.5* --  PLT 139* 141* 156 --   Cardiac Enzymes:  Lab 06/28/12 2043  CKTOTAL 115  CKMB --  CKMBINDEX --  TROPONINI --   BNP (last 3 results) No results found for this basename: PROBNP:3 in the last 8760 hours CBG: No results found for this basename: GLUCAP:5 in the last 168 hours  Recent Results (from the past 240 hour(s))  MRSA PCR SCREENING     Status: Normal   Collection Time   06/28/12  7:39 PM      Component Value Range Status Comment   MRSA by PCR NEGATIVE  NEGATIVE Final      Studies:  Recent x-ray studies have been reviewed in detail by the Attending Physician  Scheduled Meds:  Reviewed in detail by the Attending Physician   Junious Silk, ANP [facility] Office  [phone] Pager [phone]  On-Call/Text Page:      [email or web address]      password [email or web address]  If 7PM-7AM, please contact night-coverage [email or web address] Password [email or web address] 06/29/2012, 2:17 PM   LOS: 1 day   I have examined the patient and reviewed the chart. I agree with the above note which I have modified.   Calvert Cantor, MD [phone]

## 2012-06-30 LAB — PHENYTOIN LEVEL, TOTAL: Phenytoin Lvl: 11.9 ug/mL (ref 10.0–20.0)

## 2012-06-30 MED ORDER — LACOSAMIDE 100 MG PO TABS: 200.0000 mg | ORAL_TABLET | Freq: Two times a day (BID) | ORAL | Status: DC

## 2012-06-30 MED ORDER — SODIUM CHLORIDE 0.9 % IV BOLUS (SEPSIS)
250.0000 mL | Freq: Once | INTRAVENOUS | Status: AC
  Administered 2012-06-30: 250 mL via INTRAVENOUS

## 2012-06-30 MED ORDER — PHENYTOIN 50 MG PO CHEW: 50.0000 mg | CHEWABLE_TABLET | Freq: Two times a day (BID) | ORAL | Status: DC

## 2012-06-30 NOTE — Progress Notes (Signed)
Patient given seizure education. All questions answered to patient's satisfaction. Pt could not be discharged this evening because she does not have a ride. All D/C instructions and prescriptions are ready when patient is ready to leave tomorrow morning.

## 2012-06-30 NOTE — Progress Notes (Signed)
Subjective: No further seizures  Exam: Filed Vitals:   06/30/12 1635  BP: 94/50  Pulse: 78  Temp:   Resp:    Gen: In bed, NAD MS: Awake, Alert, oriented JY:NWGNF, left field cut(pt states old) Motor: 5/5 with exception of right leg which patient states is old.  Sensory:intact to LT   Impression: 56 yo F with breakthrough seizure in the setting of skipped doses of dilantin. She had been supratheraputic and therefore doses were held, now restarted on lower dose with theraputic level.   Recommendations: 1)Continue dilantin 150mg  BID. Would have level rechecked in a few days once at steady state.  2) OK to D/C home from neurology perspective on current meds. Neurology will sign off,please call with any questions.   [name], MD [facility] [phone]  If 7pm- 7am, please page neurology on call at [phone].

## 2012-06-30 NOTE — Discharge Summary (Signed)
Physician Discharge Summary  LITTIE HASSETT EXB:[id number] DOB: 1955-11-26 DOA: 06/28/2012  PCP: Pearson Grippe, MD  Admit date: 06/28/2012 Discharge date: 06/30/2012  Time spent: >30 minutes  Recommendations for Outpatient Follow-up:      Follow-up Information    Follow up with Pearson Grippe, MD. (in 1week, call for appt upon discharge)    Contact information:   [address] [place] [address] [phone]       Follow up with Joycelyn Schmid, MD. (in1week, call for appt upon discharge)    Contact information:   [address], [address] [address] [facility] [place] [address] [phone]          Discharge Diagnoses:  Principal Problem:  *Recurrent seizures Active Problems:  Seizure disorder  History of Intracranial bleed  Tobacco dependence  Thrombocytopenia- chronic  Noncompliance with medication treatment due to underuse of medication  Stress-personal   Discharge Condition: improved/stable  Diet recommendation: regular  Filed Weights   06/28/12 1920  Weight: 58.1 kg (128 lb 1.4 oz)    History of present illness:  Ms. baab is a 56/F with PMH of seizures following prior h/o intracranial bleed and surgery 24 years ago, had been taking dilantin and Vimpat most recently.  Patient currently lethargic and unable to give reliable history and hence information obtained from her son. She had a seizure on Thursday and went to ER at [facility], her dilantin level was supra-therapeutic and was asked to stop her dilantin for 2 days then resume it.  Her son thinks she resumed her dilantin yesterday or today.  She had a seizure this morning around 8 am and then at 2pm when she took her mother to the doctors office then was brought by EMS to the ER.  In the ER had another seizure. All these seizures have been generalized tonic clonic with loss of consciousness. No bowel bladder incontinence noted. She was admitted for further evaluation and  management.   Hospital Course:  Principal Problem:  *Recurrent seizures/ Noncompliance with medication treatment due to underuse of medication  *Appreciate neurology assistance  *In further talking with the patient she had a complete misunderstanding of her discharge paperwork from the ER from the visit for 06/25/2012. She stopped both her Dilantin and Vimpat for 24 hrs.  *No further seizures since admission . Neurology was consulted and saw patient and changed her Dilantin 250 mg twice a day and its Vimpat to 200 mg twice a day and on followup recommended to keep her on these dosages she was to followup with Dr. Marjory Lies in 1 week. Discussed patient with neuro today and from his standpoint okay to discharge. She has remained seizure-free while in the hospital. Active Problems:  Hypotension-patient had borderline blood pressures in the 90s to low 100s in the hospital and reports she has a history of this and she has remained asymptomatic and afebrile since 11/26nwith no leukocytosis. Her hemoglobin has also remained stable at 12.4 prior to discharge. Seizure disorder due History of Intracranial bleed  *As above  Stress-personal  *Patient endorses significant interpersonal stressors-emotional support given  Tobacco dependence  *Discuss with patient the importance of cessation and supported patient and the fact she has decreased from 2 packs of cigarettes per day to less than one pack per day  Thrombocytopenia- chronic  *Stable and within usual baseline  Fever  - on 11/25, resolved With cough- likely viral bronchitis. She has remained afebrile on no antibiotics, and has not had  any further cough.   Procedures:  None  Consultations:  Neurology  Discharge Exam: Filed Vitals:   06/30/12 0700 06/30/12 1317 06/30/12 1545 06/30/12 1635  BP: 93/40 99/43 92/52  94/50  Pulse: 70 69 72 78  Temp: 98.2 F (36.8 C) 98.2 F (36.8 C) 99.1 F (37.3 C)   TempSrc: Oral Oral Oral   Resp: 18 18 18     Height:      Weight:      SpO2: 98% 97% 97%     Exam:  General: No acute respiratory distress  Lungs: Clear to auscultation bilaterally without wheezes or crackles  Cardiovascular: Regular rate and rhythm without murmur gallop or rub normal S1 and S2  Abdomen: Nontender, nondistended, soft, bowel sounds positive, no rebound, no ascites, no appreciable mass  Musculoskeletal: No significant cyanosis, clubbing of bilateral lower extremities  Neurological: Alert and oriented x3, moves all extremities x4, exam non-focal  Discharge Instructions  Discharge Orders    Future Orders Please Complete By Expires   Diet general      Increase activity slowly          Medication List     As of 06/30/2012  7:02 PM    TAKE these medications         albuterol 108 (90 BASE) MCG/ACT inhaler   Commonly known as: PROVENTIL HFA;VENTOLIN HFA   Inhale 2 puffs into the lungs every 4 (four) hours as needed. For shortness of breath      HYDROcodone-acetaminophen 5-500 MG per tablet   Commonly known as: VICODIN   Take 0.5 tablets by mouth every 4 (four) hours as needed. For pain      Lacosamide 100 MG Tabs   Take 2 tablets (200 mg total) by mouth 2 (two) times daily.      phenytoin 100 MG ER capsule   Commonly known as: DILANTIN   Take 100 mg by mouth 2 (two) times daily.      phenytoin 50 MG tablet   Commonly known as: DILANTIN   Chew 1 tablet (50 mg total) by mouth 2 (two) times daily.           Follow-up Information    Follow up with Pearson Grippe, MD. (in 1week, call for appt upon discharge)    Contact information:   [address] [place] [address] [phone]       Follow up with Joycelyn Schmid, MD. (in1week, call for appt upon discharge)    Contact information:   [address], [address] [address] [facility] [address] [phone]           The results of significant diagnostics from this hospitalization (including imaging,  microbiology, ancillary and laboratory) are listed below for reference.    Significant Diagnostic Studies: Ct Head Wo Contrast  06/28/2012  *RADIOLOGY REPORT*  Clinical Data: Seizure.  CT HEAD WITHOUT CONTRAST  Technique:  Contiguous axial images were obtained from the base of the skull through the vertex without contrast.  Comparison: Head CT 03/02/2012.  Findings: Large area of encephalomalacia in the right parieto- occipital region is unchanged.  No acute intracranial abnormality. Specifically, no signs of acute intracranial hemorrhage, no definite region to suggest acute/subacute cerebral ischemia, no mass, mass effect or hydrocephalous.  Postoperative changes of right parietal craniotomy.  No acute displaced skull fractures are identified.  Visualized paranasal sinuses and mastoids pneumatized, with exception of a small left mastoid effusion which appears to be chronic.  IMPRESSION: 1.  No acute intracranial abnormalities. 2.  The appearance of the brain is similar to prior examinations with postoperative changes and large area of encephalomalacia in the right lateral occipital regions, as above. 3.  Small chronic left mastoid effusion is unchanged.   Original Report Authenticated By: Trudie Reed, M.D.     Microbiology: Recent Results (from the past 240 hour(s))  MRSA PCR SCREENING     Status: Normal   Collection Time   06/28/12  7:39 PM      Component Value Range Status Comment   MRSA by PCR NEGATIVE  NEGATIVE Final      Labs: Basic Metabolic Panel:  Lab 06/29/12 1610 06/28/12 2043 06/28/12 1808 06/28/12 1644 06/24/12 2147  NA 140 -- -- 141 140  K 3.8 -- -- 4.5 4.3  CL 107 -- -- 104 107  CO2 29 -- -- 25 --  GLUCOSE 95 -- -- 92 91  BUN 10 -- -- 9 6  CREATININE 0.68 0.62 -- 0.60 0.70  CALCIUM 8.4 -- -- 9.2 --  MG -- -- 2.1 -- --  PHOS -- -- 3.3 -- --   Liver Function Tests:  Lab 06/29/12 0510  AST 16  ALT 11  ALKPHOS 66  BILITOT 0.2*  PROT 5.5*  ALBUMIN 3.0*   No  results found for this basename: LIPASE:5,AMYLASE:5 in the last 168 hours No results found for this basename: AMMONIA:5 in the last 168 hours CBC:  Lab 06/29/12 0510 06/28/12 2043 06/28/12 1644 06/24/12 2147  WBC 4.2 5.1 3.6* --  NEUTROABS -- -- 2.0 --  HGB 12.4 12.5 13.4 12.9  HCT 37.0 37.2 39.6 38.0  MCV 101.9* 99.7 101.5* --  PLT 139* 141* 156 --   Cardiac Enzymes:  Lab 06/28/12 2043  CKTOTAL 115  CKMB --  CKMBINDEX --  TROPONINI --   BNP: BNP (last 3 results) No results found for this basename: PROBNP:3 in the last 8760 hours CBG: No results found for this basename: GLUCAP:5 in the last 168 hours     Signed:  Kela Millin  [facility] 06/30/2012, 7:02 PM

## 2012-07-01 DIAGNOSIS — D696 Thrombocytopenia, unspecified: Secondary | ICD-10-CM

## 2012-07-01 MED ORDER — PHENYTOIN 50 MG PO CHEW: 50.0000 mg | CHEWABLE_TABLET | Freq: Two times a day (BID) | ORAL | Status: DC

## 2012-07-01 NOTE — Progress Notes (Signed)
Went over discharge instructions with pt. Pt states she understands.  Salvadore Oxford, RN 07/01/12 [phone]

## 2012-07-01 NOTE — Progress Notes (Signed)
[facility]                                                               Progress note Patient did not leave yesterday due to not having ride home, she denies any new complaints today. She remained seizure free overnight. She denies any dizziness no chest pain and shortness of breath. Filed Vitals:   07/01/12 0601  BP: 89/73  Pulse: 72  Temp: 97.8 F (36.6 C)  Resp: 18   on physical exam her lungs sound clear, cardiovascular regular rate and rhythm, abdomen-soft, bowel sounds present nontender nondistended no organomegaly and no masses palpable -She remains and medically stable for discharge for discharge home today, she received a discharge instructions with particular attention to changes in her medication dosages, and voiced understanding. She is to followup with Dr. Marjory Lies in one week. Please see the full discharge summary- unchanged,  for details of her hospital stay.  Donnalee Curry MD [phone]

## 2012-09-09 ENCOUNTER — Emergency Department (HOSPITAL_COMMUNITY)
Admission: EM | Admit: 2012-09-09 | Discharge: 2012-09-09 | Disposition: A | Discharge status: Home / Self Care | Payer: Medicare Other | Attending: Emergency Medicine | Admitting: Emergency Medicine

## 2012-09-09 ENCOUNTER — Encounter (HOSPITAL_COMMUNITY): Payer: Self-pay

## 2012-09-09 ENCOUNTER — Emergency Department (HOSPITAL_COMMUNITY): Payer: Medicare Other

## 2012-09-09 DIAGNOSIS — J45901 Unspecified asthma with (acute) exacerbation: Secondary | ICD-10-CM | POA: Insufficient documentation

## 2012-09-09 DIAGNOSIS — F17200 Nicotine dependence, unspecified, uncomplicated: Secondary | ICD-10-CM | POA: Insufficient documentation

## 2012-09-09 DIAGNOSIS — Z86718 Personal history of other venous thrombosis and embolism: Secondary | ICD-10-CM | POA: Insufficient documentation

## 2012-09-09 DIAGNOSIS — G40909 Epilepsy, unspecified, not intractable, without status epilepticus: Secondary | ICD-10-CM | POA: Insufficient documentation

## 2012-09-09 DIAGNOSIS — Z79899 Other long term (current) drug therapy: Secondary | ICD-10-CM | POA: Insufficient documentation

## 2012-09-09 DIAGNOSIS — Z8679 Personal history of other diseases of the circulatory system: Secondary | ICD-10-CM | POA: Insufficient documentation

## 2012-09-09 DIAGNOSIS — R569 Unspecified convulsions: Secondary | ICD-10-CM

## 2012-09-09 DIAGNOSIS — F430 Acute stress reaction: Secondary | ICD-10-CM | POA: Insufficient documentation

## 2012-09-09 LAB — URINALYSIS, ROUTINE W REFLEX MICROSCOPIC
Glucose, UA: NEGATIVE mg/dL
Hgb urine dipstick: NEGATIVE
Ketones, ur: NEGATIVE mg/dL
Protein, ur: NEGATIVE mg/dL
Urobilinogen, UA: 0.2 mg/dL (ref 0.0–1.0)

## 2012-09-09 LAB — RAPID URINE DRUG SCREEN, HOSP PERFORMED
Amphetamines: NOT DETECTED
Benzodiazepines: NOT DETECTED
Opiates: NOT DETECTED
Tetrahydrocannabinol: NOT DETECTED

## 2012-09-09 LAB — CBC
HCT: 40.3 % (ref 36.0–46.0)
Hemoglobin: 13.6 g/dL (ref 12.0–15.0)
MCH: 34.3 pg — ABNORMAL HIGH (ref 26.0–34.0)
MCHC: 33.7 g/dL (ref 30.0–36.0)
MCV: 101.5 fL — ABNORMAL HIGH (ref 78.0–100.0)
RBC: 3.97 MIL/uL (ref 3.87–5.11)

## 2012-09-09 MED ORDER — ALBUTEROL SULFATE (5 MG/ML) 0.5% IN NEBU
5.0000 mg | INHALATION_SOLUTION | Freq: Once | RESPIRATORY_TRACT | Status: AC
  Administered 2012-09-09: 5 mg via RESPIRATORY_TRACT
  Filled 2012-09-09: qty 1

## 2012-09-09 MED ORDER — ACETAMINOPHEN 325 MG PO TABS
650.0000 mg | ORAL_TABLET | Freq: Once | ORAL | Status: AC
  Administered 2012-09-09: 650 mg via ORAL
  Filled 2012-09-09: qty 2

## 2012-09-09 MED ORDER — SODIUM CHLORIDE 0.9 % IV SOLN
INTRAVENOUS | Status: DC
  Administered 2012-09-09: 06:00:00 via INTRAVENOUS

## 2012-09-09 MED ORDER — KETOROLAC TROMETHAMINE 30 MG/ML IJ SOLN
30.0000 mg | Freq: Once | INTRAMUSCULAR | Status: AC
  Administered 2012-09-09: 30 mg via INTRAVENOUS
  Filled 2012-09-09: qty 1

## 2012-09-09 NOTE — ED Notes (Signed)
Pt to stay until dilantin level returns.

## 2012-09-09 NOTE — ED Provider Notes (Signed)
History     CSN: [id number]  Arrival date & time 09/09/12  0453   First MD Initiated Contact with Patient 09/09/12 [phone]      Chief Complaint  Patient presents with  . Seizures    (Consider location/radiation/quality/duration/timing/severity/associated sxs/prior treatment) HPI History provided by patient and her daughter bedside. Has history of seizure disorder, relates that she is taking impact and Dilantin as prescribed. She typically has seizures less than once a month. Today she had 2 seizures. She denies any recent fevers, or recent illness. She is a smoker and has asthma with baseline wheezing.  No increased shortness of breath. No chest pain.  Patient has been under a lot of stress recently. Her mother had a heart attack 3 weeks ago and she has being staying with her at night.  More recently has suffered from sleep deprivation and staying up all light with her mother.    No alcohol use or drug use. No new medications. No fall or trauma. Both seizures tonight were witnessed. No incontinence. No tongue biting. Amnestic to events with post ictal state and currently feeling back to baseline.  She is requesting a breathing treatment for mild wheezing. Seizures symptoms moderate in severity. She does complain of mild headache at this time. No neck pain or neck stiffness.  Past Medical History  Diagnosis Date  . Seizures   . DVT (deep venous thrombosis)   . Asthma   . Cerebral hemorrhage     Past Surgical History  Procedure Date  . Cesarean section   . Brain surgery     History reviewed. No pertinent family history.  History  Substance Use Topics  . Smoking status: Current Every Day Smoker  . Smokeless tobacco: Not on file  . Alcohol Use: No    OB History    Grav Para Term Preterm Abortions TAB SAB Ect Mult Living                  Review of Systems  Constitutional: Negative for fever and chills.  HENT: Negative for neck pain and neck stiffness.   Eyes: Negative for  pain.  Respiratory: Negative for shortness of breath.   Cardiovascular: Negative for chest pain.  Gastrointestinal: Negative for abdominal pain.  Genitourinary: Negative for dysuria.  Musculoskeletal: Negative for back pain.  Skin: Negative for rash.  Neurological: Positive for seizures.  All other systems reviewed and are negative.    Allergies  Codeine and Keppra  Home Medications   Current Outpatient Rx  Name  Route  Sig  Dispense  Refill  . ALBUTEROL SULFATE HFA 108 (90 BASE) MCG/ACT IN AERS   Inhalation   Inhale 2 puffs into the lungs every 4 (four) hours as needed. For shortness of breath         . LACOSAMIDE 100 MG PO TABS   Oral   Take 2 tablets (200 mg total) by mouth 2 (two) times daily.   60 tablet      . PHENYTOIN SODIUM EXTENDED 100 MG PO CAPS   Oral   Take 100 mg by mouth 2 (two) times daily.          Marland Kitchen PHENYTOIN 50 MG PO CHEW   Oral   Chew 1 tablet (50 mg total) by mouth 2 (two) times daily.   60 tablet   0     (Take along with the 100mg  tab for a total of 142m ...     BP 133/61  Pulse 71  Temp 98.7 F (37.1 C) (Oral)  Resp 15  SpO2 100%  Physical Exam  Constitutional: She is oriented to person, place, and time. She appears well-developed and well-nourished.  HENT:  Head: Normocephalic and atraumatic.  Mouth/Throat: Oropharynx is clear and moist. No oropharyngeal exudate.  Eyes: EOM are normal. Pupils are equal, round, and reactive to light. No scleral icterus.  Neck: Neck supple.  Cardiovascular: Normal rate, regular rhythm and intact distal pulses.   Pulmonary/Chest: Effort normal. No stridor. No respiratory distress.       Mild intermittent expiratory wheezes bilaterally  Abdominal: Soft. Bowel sounds are normal. She exhibits no distension. There is no tenderness.  Musculoskeletal: Normal range of motion. She exhibits no edema.  Neurological: She is alert and oriented to person, place, and time. She has normal reflexes. She displays  normal reflexes. No cranial nerve deficit. She exhibits normal muscle tone. Coordination normal.  Skin: Skin is warm and dry. No rash noted.    ED Course  Procedures (including critical care time)  Results for orders placed during the hospital encounter of 09/09/12  CBC      Component Value Range   WBC 3.7 (*) 4.0 - 10.5 K/uL   RBC 3.97  3.87 - 5.11 MIL/uL   Hemoglobin 13.6  12.0 - 15.0 g/dL   HCT 16.1  09.6 - 04.5 %   MCV 101.5 (*) 78.0 - 100.0 fL   MCH 34.3 (*) 26.0 - 34.0 pg   MCHC 33.7  30.0 - 36.0 g/dL   RDW 40.9  81.1 - 91.4 %   Platelets 137 (*) 150 - 400 K/uL  URINALYSIS, ROUTINE W REFLEX MICROSCOPIC      Component Value Range   Color, Urine YELLOW  YELLOW   APPearance CLOUDY (*) CLEAR   Specific Gravity, Urine 1.017  1.005 - 1.030   pH 7.0  5.0 - 8.0   Glucose, UA NEGATIVE  NEGATIVE mg/dL   Hgb urine dipstick NEGATIVE  NEGATIVE   Bilirubin Urine NEGATIVE  NEGATIVE   Ketones, ur NEGATIVE  NEGATIVE mg/dL   Protein, ur NEGATIVE  NEGATIVE mg/dL   Urobilinogen, UA 0.2  0.0 - 1.0 mg/dL   Nitrite NEGATIVE  NEGATIVE   Leukocytes, UA NEGATIVE  NEGATIVE  ETHANOL      Component Value Range   Alcohol, Ethyl (B) <11  0 - 11 mg/dL  POCT I-STAT TROPONIN I      Component Value Range   Troponin i, poc 0.00  0.00 - 0.08 ng/mL   Comment 3            Ct Head Wo Contrast  09/09/2012  *RADIOLOGY REPORT*  Clinical Data: Seizures.  History of previous seizures, postoperative changes, and previous skull fracture and hemorrhage.  CT HEAD WITHOUT CONTRAST  Technique:  Contiguous axial images were obtained from the base of the skull through the vertex without contrast.  Comparison: 06/28/2012  Findings: Postoperative changes with right parietal craniotomy. Bone flap is mildly depressed anteriorly.  This appearance is stable.  There is a large area of encephalomalacia involving the right posterior parietal and occipital regions.  This is also stable.  There is associated dilatation of the  posterior horn of the right lateral ventricle.  There is no mass effect or midline shift.  There is no abnormal extra-axial fluid collection.  Wallace Cullens- white matter junctions are distinct.  Basal cisterns are not effaced.  No evidence of acute intracranial hemorrhage.  Stable opacification of portions of the left mastoid air  cells.  Mucosal thickening in the paranasal sinuses.  IMPRESSION: No acute intracranial abnormalities.  Stable appearance of the brain and skull since previous studies.   Original Report Authenticated By: Burman Nieves, M.D.    Albuterol provided and on recheck breathing comfortably with wheezing resolved  Patient educated on sleep deprivation and lowering seizure threshold - she states understanding this and agrees to seizure instructions and precautions. She has scheduled followup at [facility] for EEG.     MDM  Seizure disorder with 2 seizures tonight likely related to recent sleep deprivation.  Evaluated as above with imaging and labs reviewed.  This returned to baseline with normal neuro exam.  Vital signs and nursing notes reviewed. Stable for discharge home to continue Dilantin and Vimpat as prescribed. No indication for admission or further workup at this time.        Sunnie Nielsen, MD 09/09/12 [phone]

## 2012-09-09 NOTE — ED Notes (Signed)
JYN:[id number]<AO> Expected date:<BR> Expected time:<BR> Means of arrival:<BR> Comments:<BR> EMS/female with seizure

## 2012-09-09 NOTE — ED Notes (Signed)
Per EMS, pt from home.  Seizure at 8pm and 0415am.  Pt with hx of seizures, cerebral hemorrhage.  1st witnessed and second was not.  No seizures for a month.  Appt at [facility] this month.  Pt on dilantin at home.  Postictal on EMS arrival.  Vitals 112/68, hr 90, cbg 94,  86% ra on arrival, 100% now.  20g left wrist by EMS.

## 2012-09-09 NOTE — ED Notes (Signed)
Voiced understanding of instructions given 

## 2012-09-24 IMAGING — CT CT HEAD W/O CM
1 of 3 series · 13 of 30 positions shown, 17 images · non-contrast
Comparison: 03/05/2009.

CLINICAL DATA: Seizure.  Headache, weakness.  Previous cerebral
hemorrhage.

CT HEAD WITHOUT CONTRAST
TECHNIQUE: Contiguous axial images were obtained from the base of
the skull through the vertex without contrast.

[Series 2: head_seq 4.5 h37s st · axial · 0.43mm/px · z∈[-220,-82]mm · 13 of 36 slices shown, 17 images]
[im 3/36  brain]
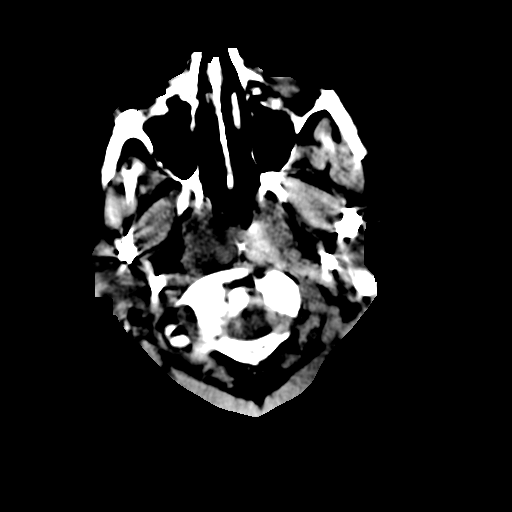
[im 3/36  bone]
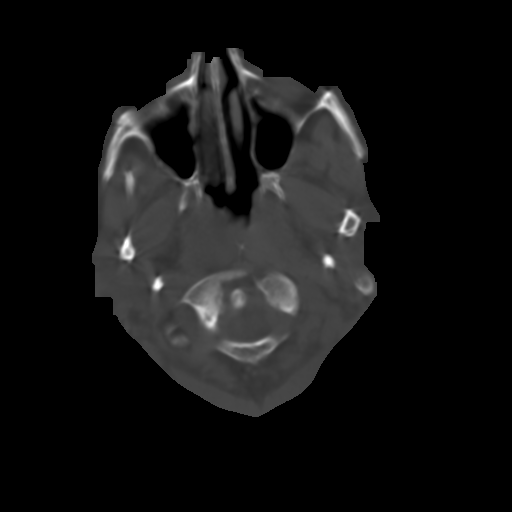
[im 6/36  brain]
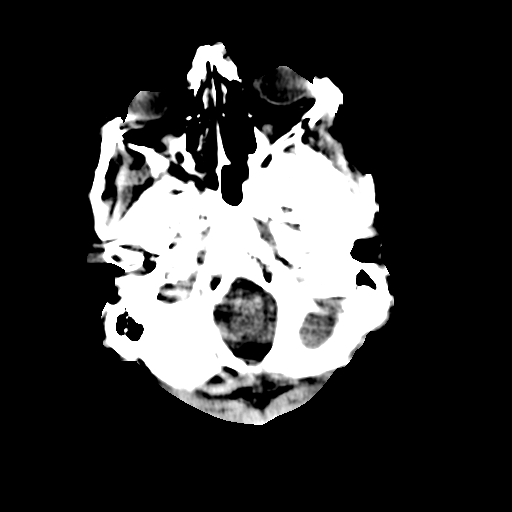
[im 8/36  brain]
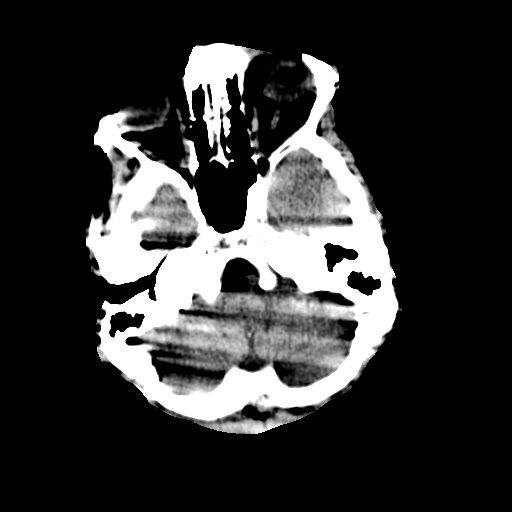
[im 11/36  brain]
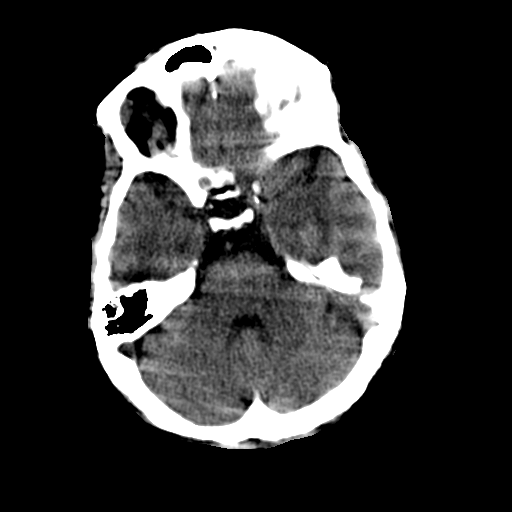
[im 13/36  brain]
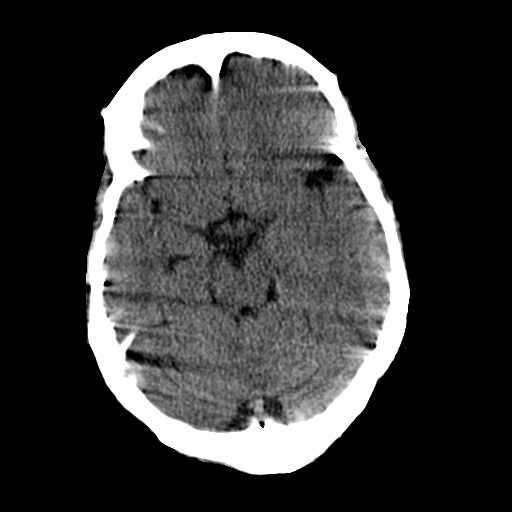
[im 13/36  bone]
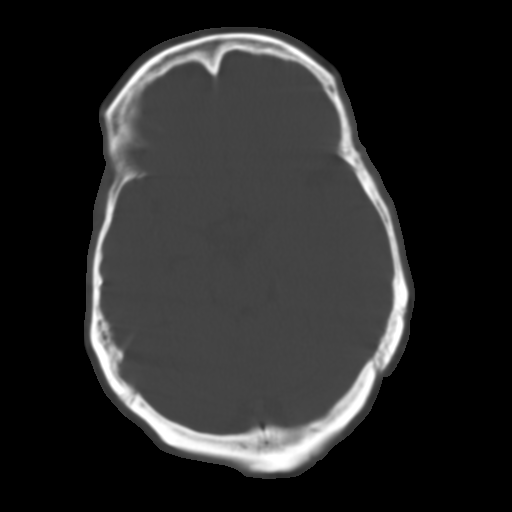
[im 16/36  brain]
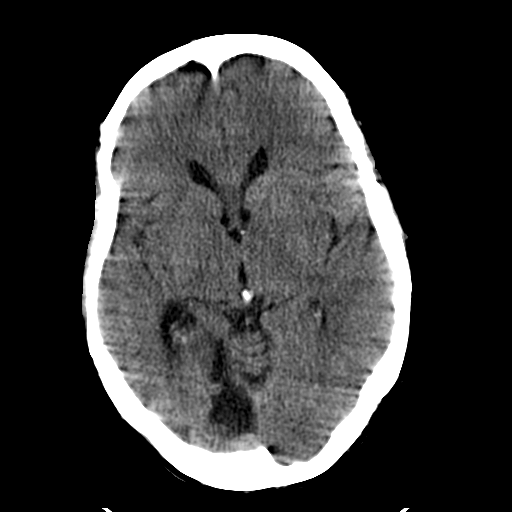
[im 18/36  brain]
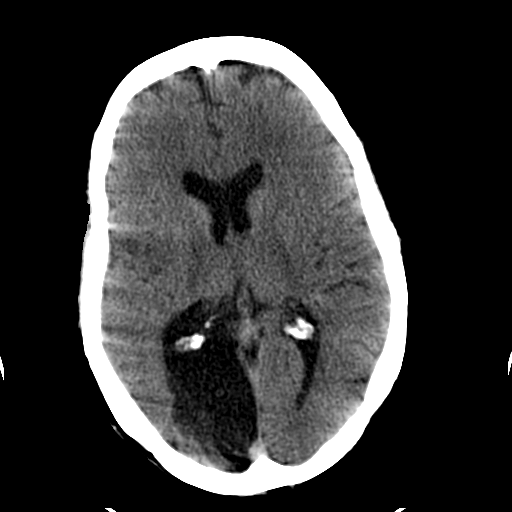
[im 21/36  brain]
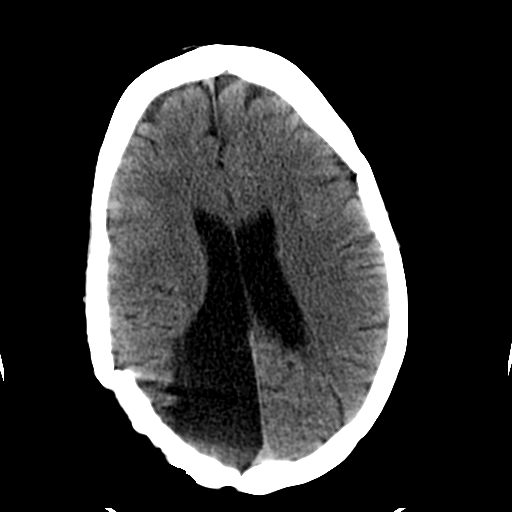
[im 23/36  brain]
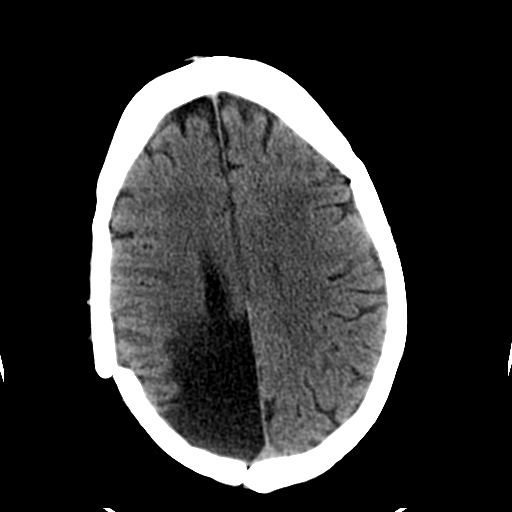
[im 23/36  bone]
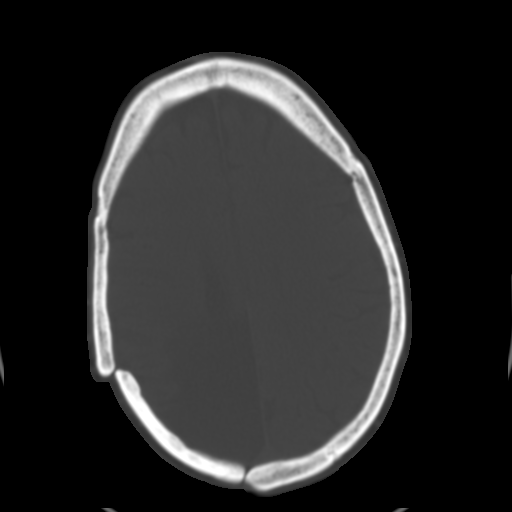
[im 26/36  brain]
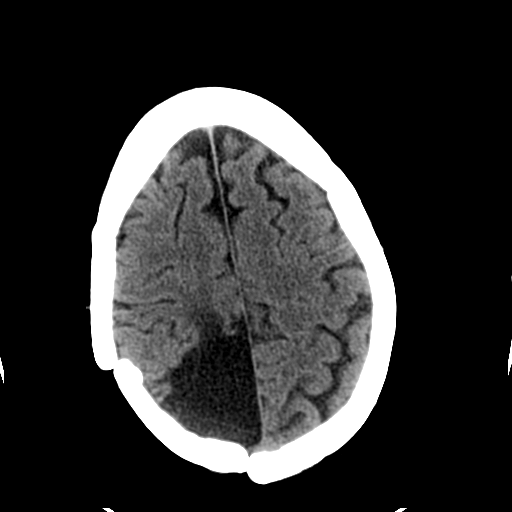
[im 28/36  brain]
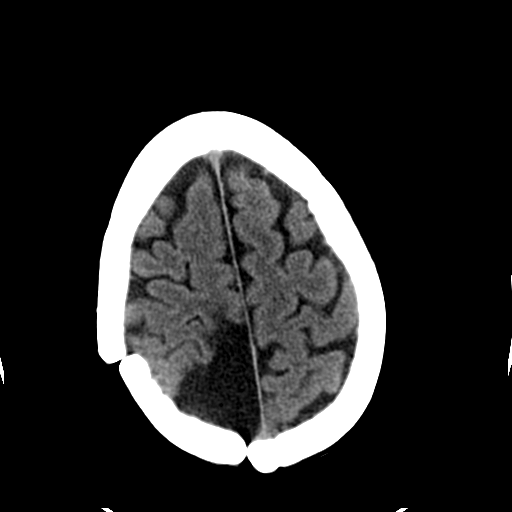
[im 31/36  brain]
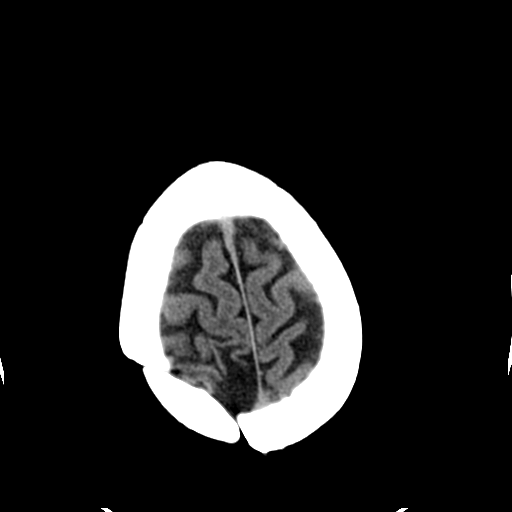
[im 33/36  brain]
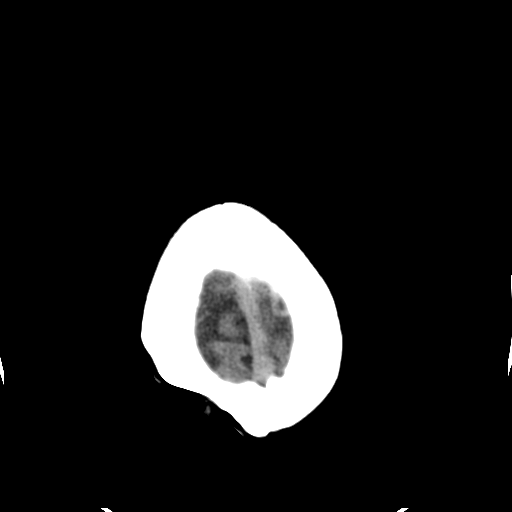
[im 33/36  bone]
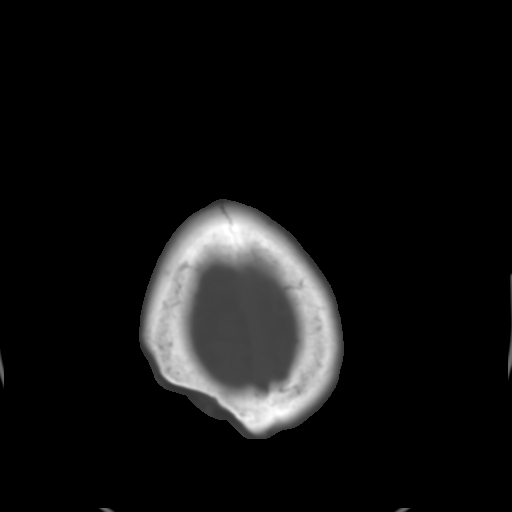

[13 of 30 positions shown; findings below may reference images not displayed]

FINDINGS: Stable right posterior parietal post craniotomy changes
with underlying encephalomalacia and porencephaly.  The lateral
ventricles remain mildly dilated.  No intracranial hemorrhage, mass
lesion or CT evidence of acute infarction.  Small right maxillary
sinus retention cysts.
IMPRESSION: No acute abnormality.

## 2012-09-25 IMAGING — CR DG CHEST 2V
2 series · 2 of 2 positions shown · non-contrast
Comparison: 05/28/2010

CLINICAL DATA: Seizure disorder.  Dilantin toxicity.

CHEST - 2 VIEW

[w chest pa]
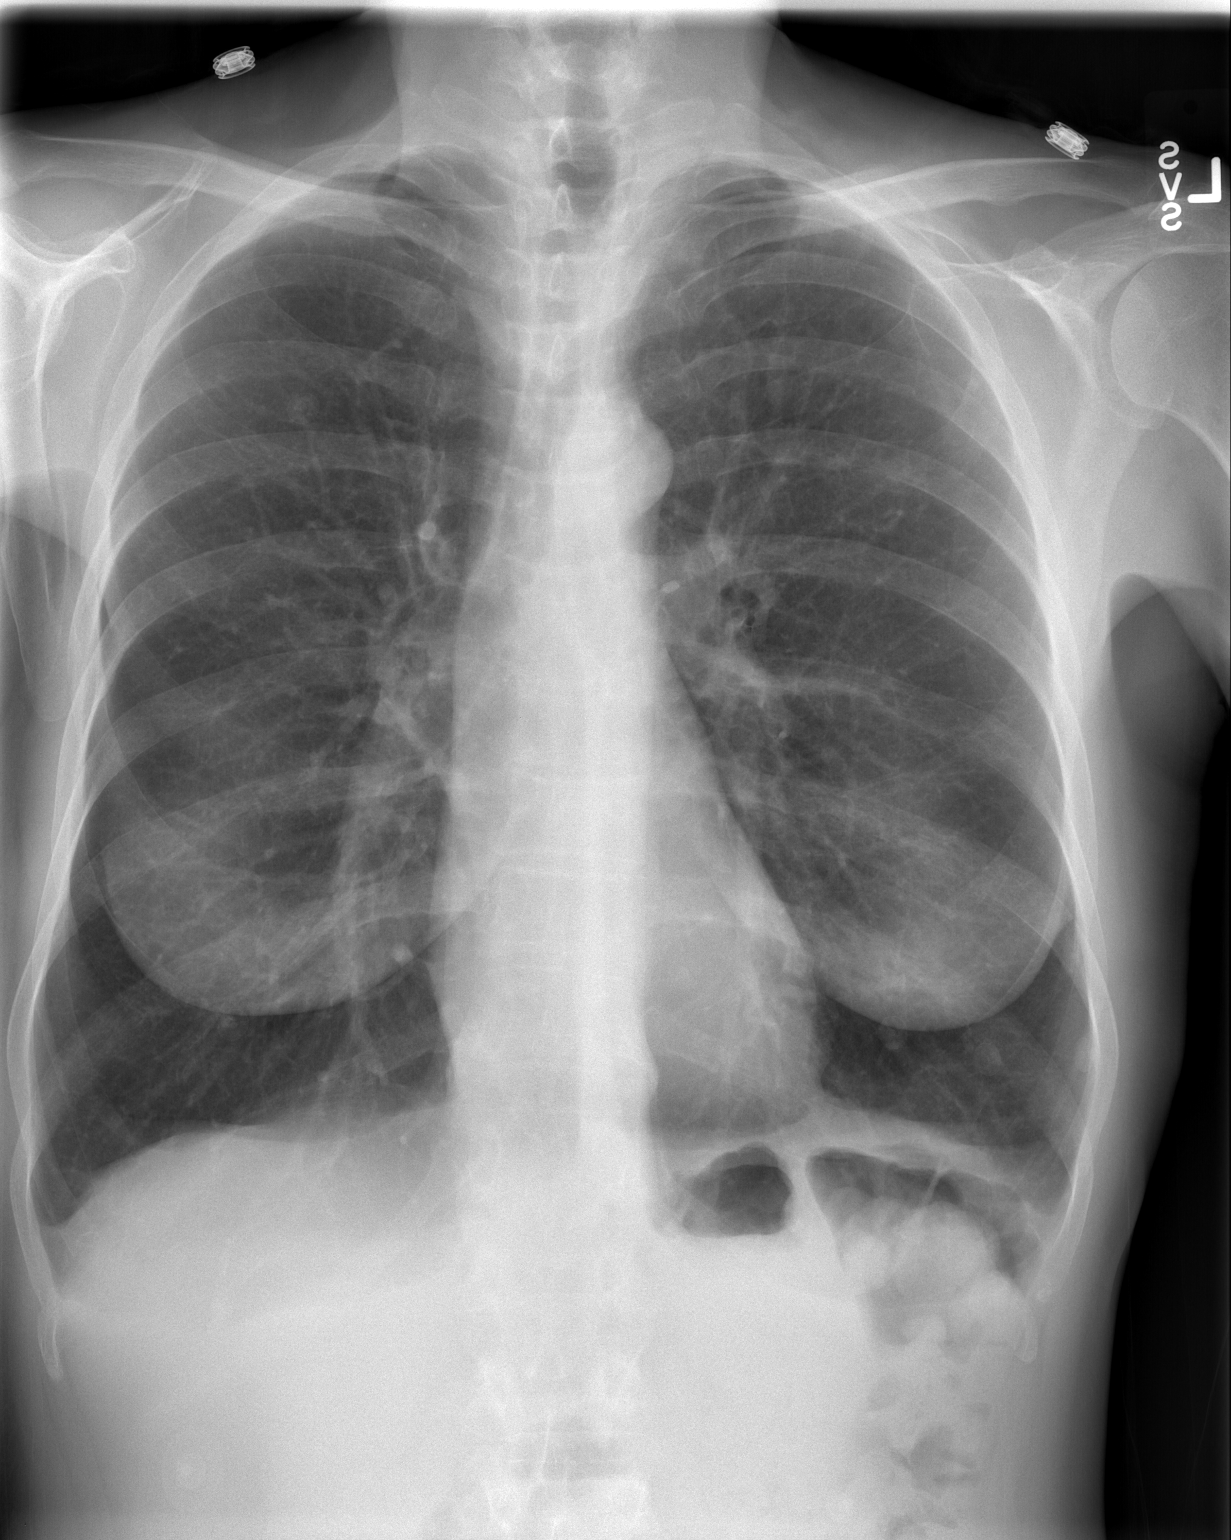

[w chest lat]
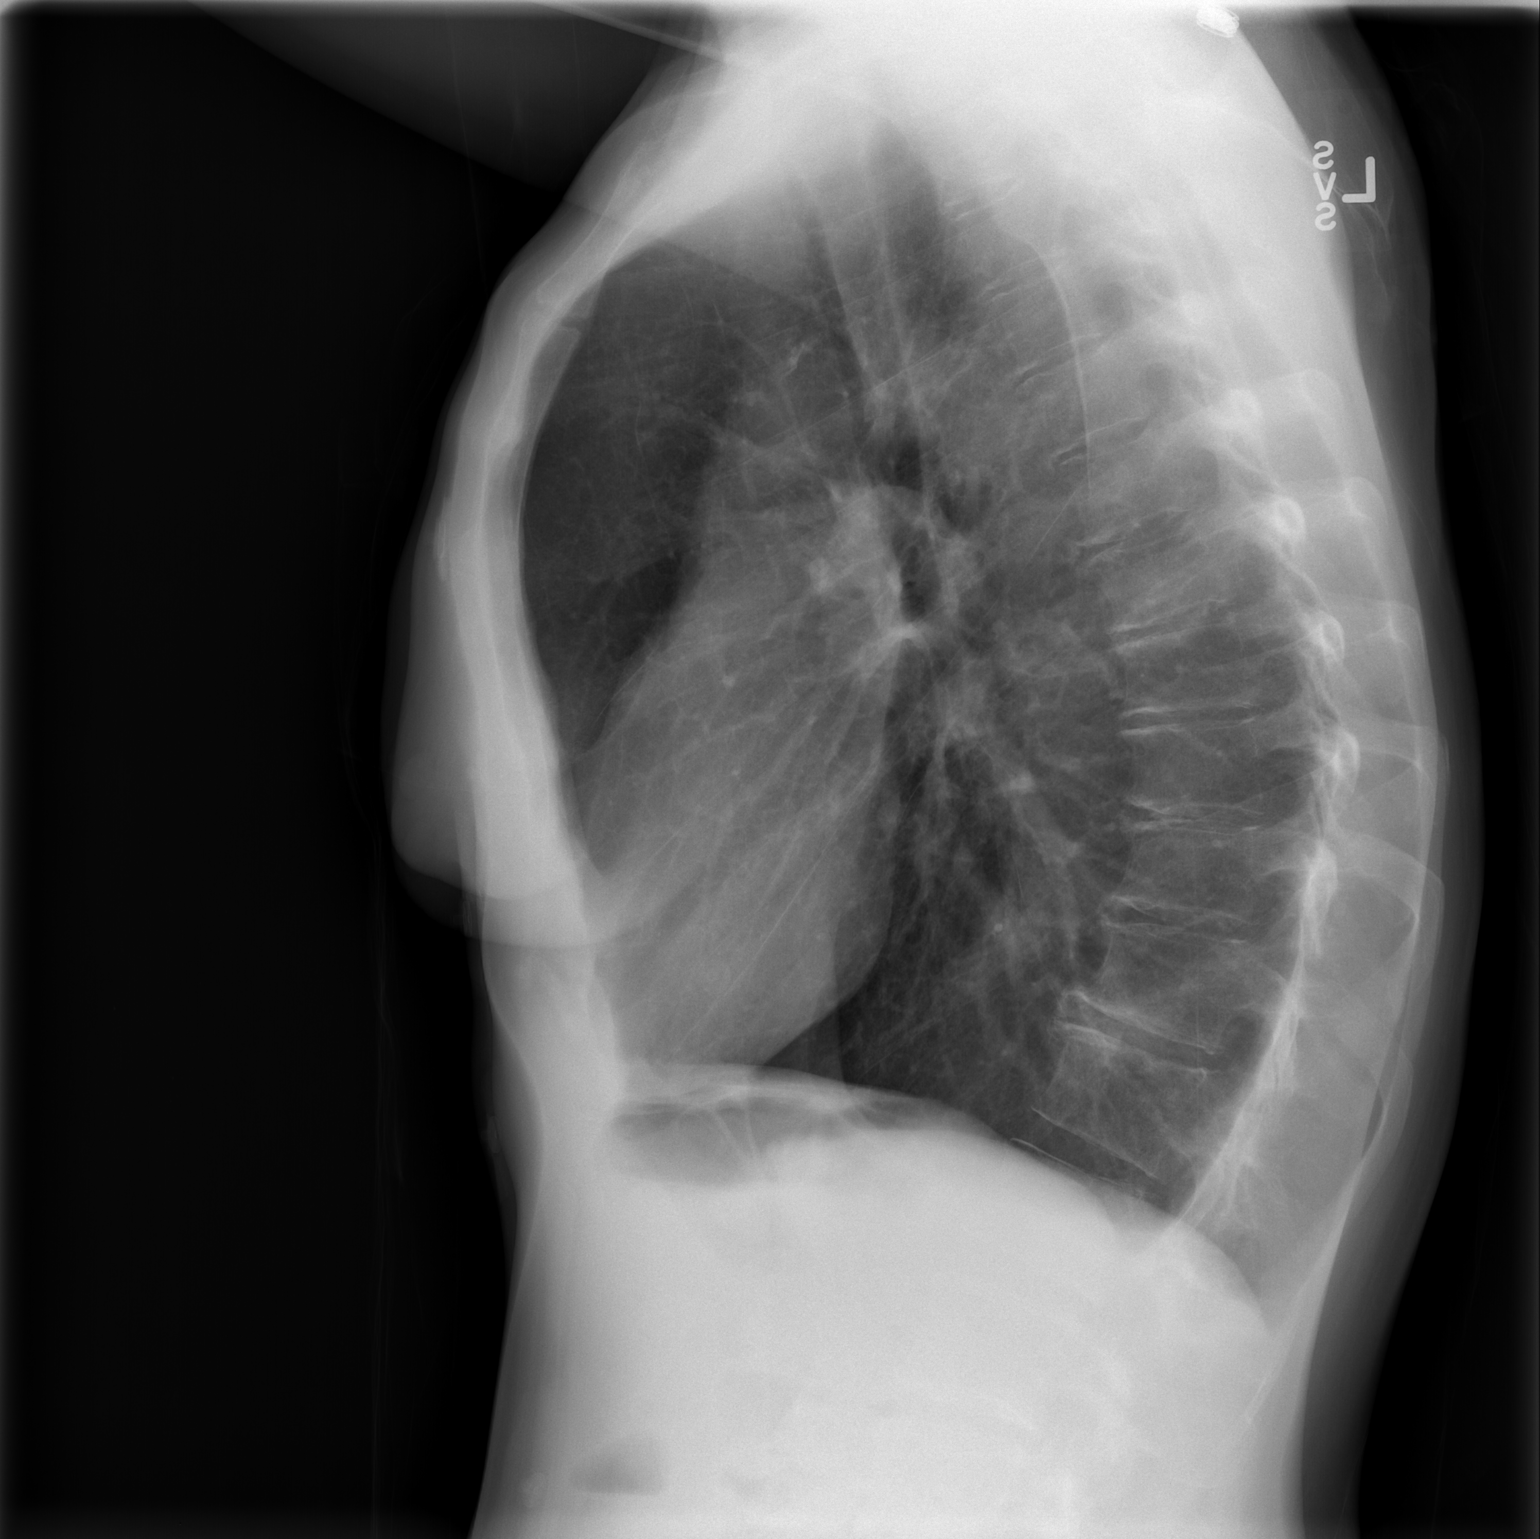

[2 of 2 positions shown; findings below may reference images not displayed]

FINDINGS: Cardiac leads project over the lungs.

Cardiac and mediastinal contours appear unremarkable.

Airway thickening may reflect bronchitis or reactive airways
disease.  No airspace opacity is identified to suggest bacterial
pneumonia pattern.  No pleural effusion identified.
IMPRESSION: 1. Airway thickening may reflect bronchitis or reactive airways
disease.  No airspace opacity is identified to suggest bacterial
pneumonia pattern.

## 2012-09-25 IMAGING — MR MR HEAD WO/W CM
8 of 12 series · 27 of 48 positions shown · IV contrast (multihance)
Comparison: CT 08/15/2010

CLINICAL DATA: Seizure.  History intracranial hemorrhage

MRI HEAD WITHOUT AND WITH CONTRAST
TECHNIQUE: Multiplanar, multiecho pulse sequences of the brain and
surrounding structures were obtained according to standard protocol
without and with intravenous contrast
Contrast: 11 ml Multihance IV

[Series 4: DWI · axial · 5.0mm · 1.09mm/px · z∈[-96,+47]mm · 7 of 60 slices shown (1 of 2)]
[im 1/60]
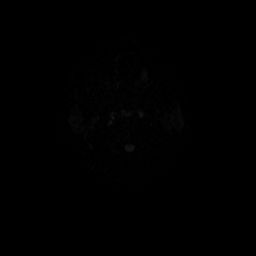
[im 10/60]
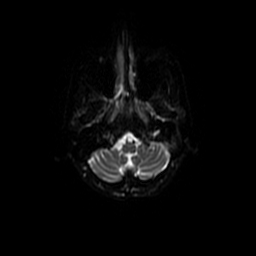
[im 20/60]
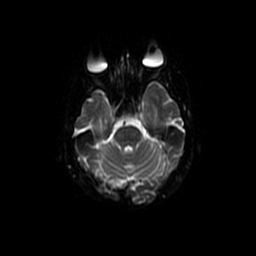
[im 30/60]
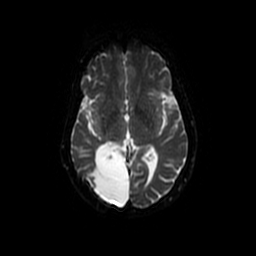
[im 40/60]
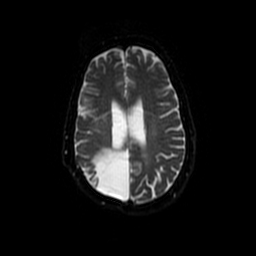
[im 50/60]
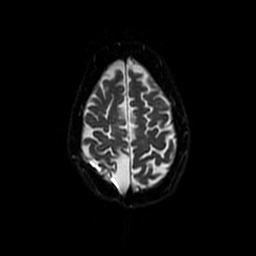
[im 60/60]
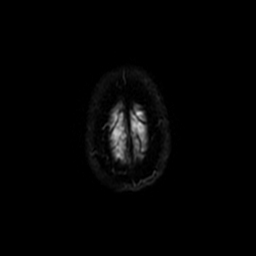

[Series 5: T2 · axial · 5.0mm · 0.43mm/px · z∈[-93,+48]mm · 3 of 23 slices shown (1 of 2)]
[im 1/23]
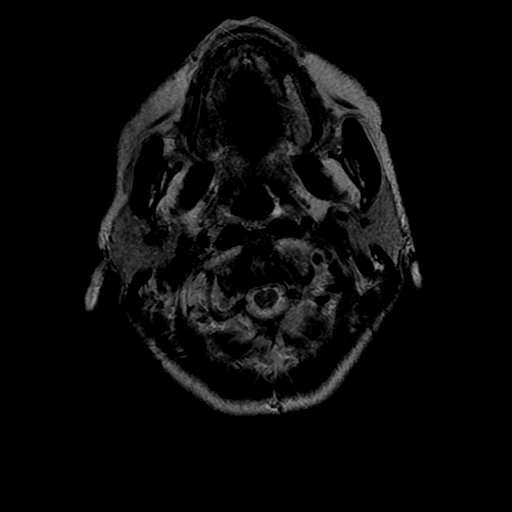
[im 12/23]
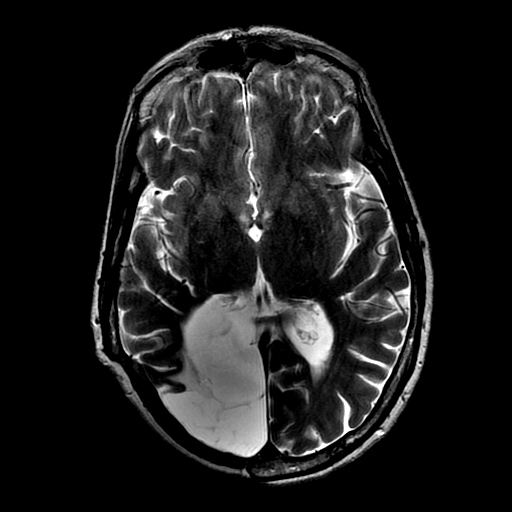
[im 23/23]
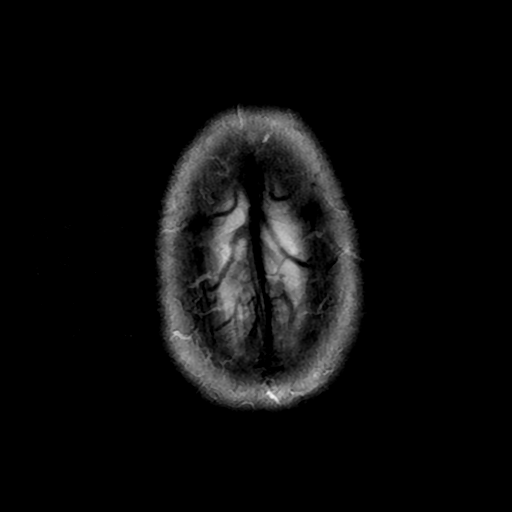

[Series 6: FLAIR · axial · 5.0mm · 0.43mm/px · z∈[-98,+53]mm · 3 of 23 slices shown]
[im 1/23]
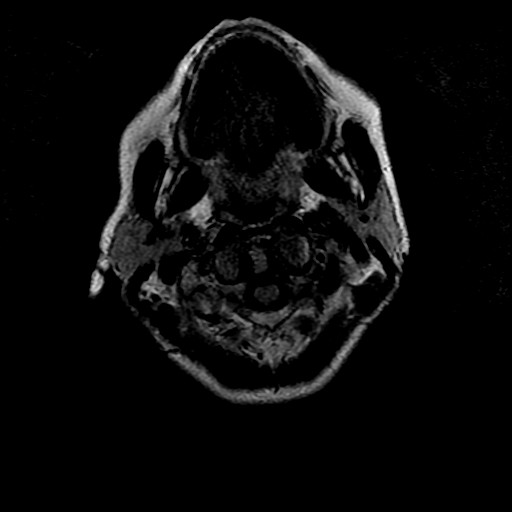
[im 12/23]
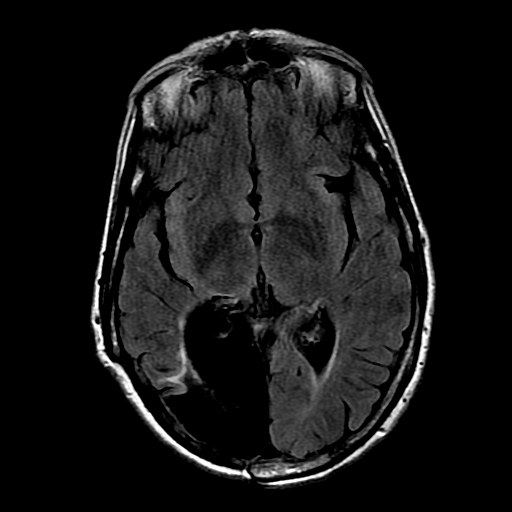
[im 23/23]
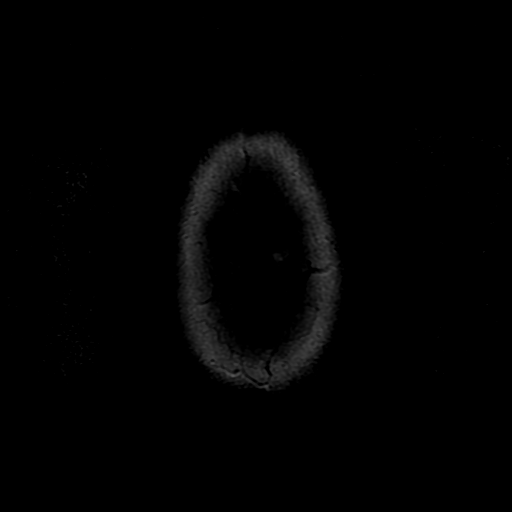

[Series 8: T2 · coronal · 3.0mm · 0.39mm/px · 3 of 28 slices shown (2 of 2)]
[im 1/28]
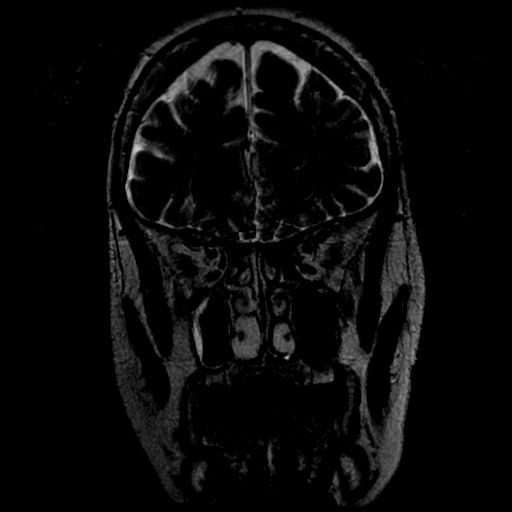
[im 14/28]
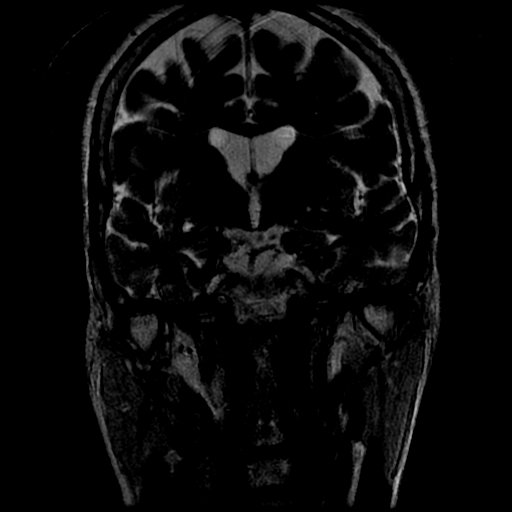
[im 28/28]
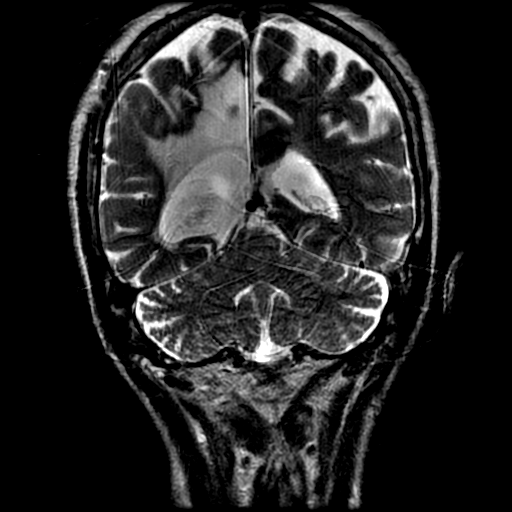

[Series 10: T2 post-contrast · coronal · 5.0mm · 0.45mm/px · 1 of 27 slices shown]
[im 1/27]
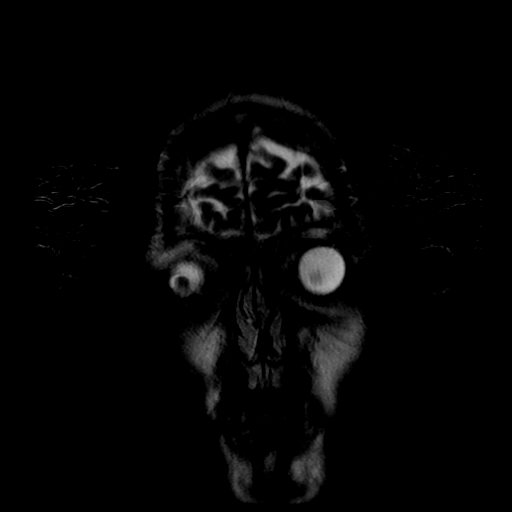

[Series 12: T1 post-contrast · coronal · 5.0mm · 0.45mm/px · 3 of 27 slices shown (1 of 2)]
[im 1/27]
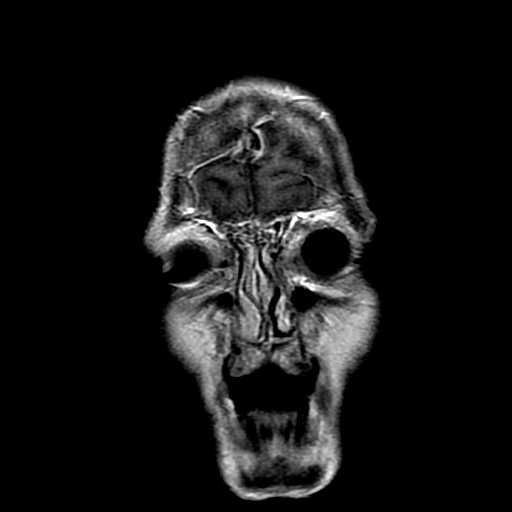
[im 14/27]
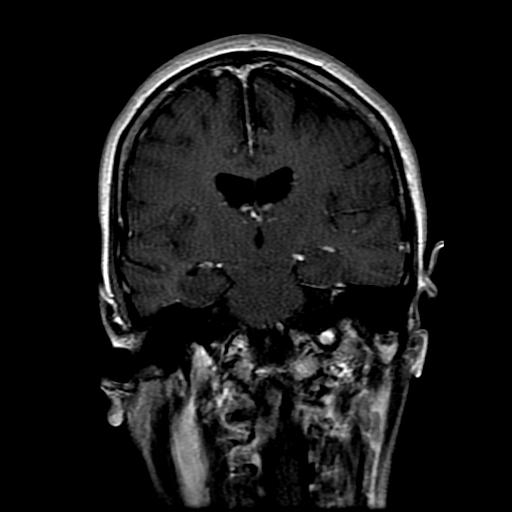
[im 27/27]
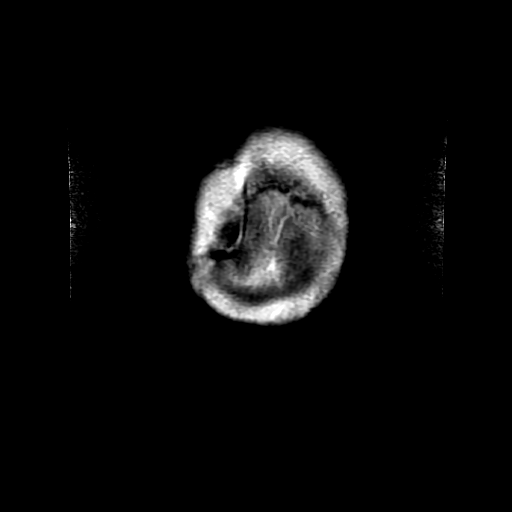

[Series 13: T1 post-contrast · sagittal · 5.0mm · 0.47mm/px · 3 of 24 slices shown (2 of 2)]
[im 1/24]
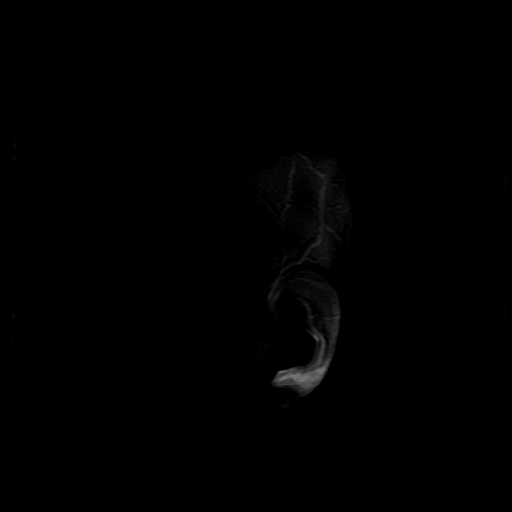
[im 12/24]
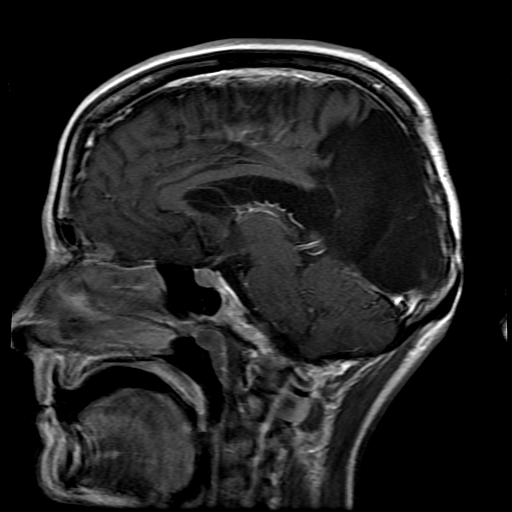
[im 24/24]
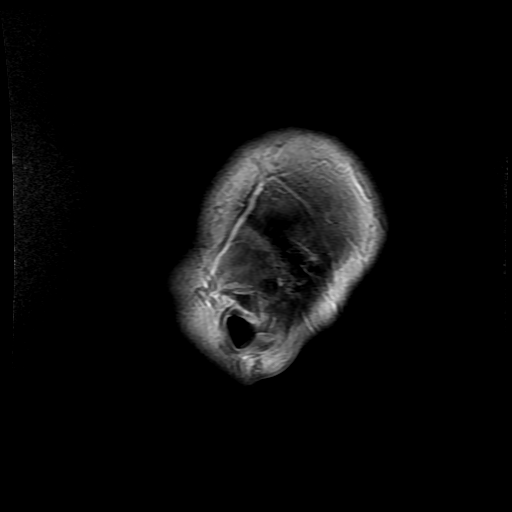

[Series 400: DWI · axial · 5.0mm · 1.09mm/px · z∈[-96,+47]mm · 4 of 30 slices shown (2 of 2)]
[im 1/30]
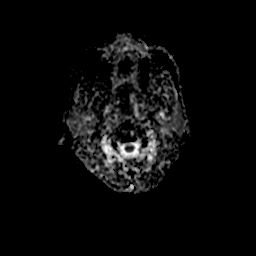
[im 10/30]
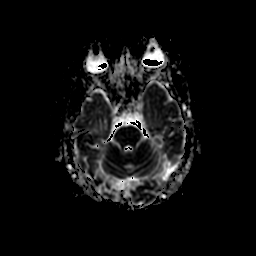
[im 20/30]
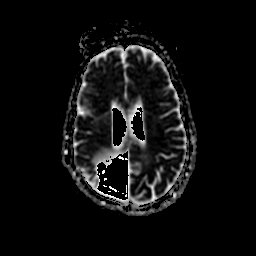
[im 30/30]
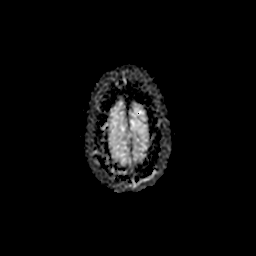

[27 of 48 positions shown; findings below may reference images not displayed]

FINDINGS: Large area of encephalomalacia in the right occipital
lobe.  Prior right occipital craniotomy.  This is stable from prior
studies.

Negative for acute infarct.  Mild chronic changes in the white
matter bilaterally.  Brainstem and cerebellum are intact.

Negative for hemorrhage or fluid collection.  Postcontrast images
of the brain reveal normal enhancement.  Negative for mass lesion.

Image quality degraded by mild motion.

Mild chronic sinusitis.
IMPRESSION: No acute abnormality.  Postsurgical encephalomalacia in the right
occipital lobe is stable.

## 2012-10-04 IMAGING — CR DG LUMBAR SPINE COMPLETE 4+V
5 series · 5 of 5 positions shown · non-contrast
Comparison: 05/20/2010

CLINICAL DATA: Fall.  Pain.

LUMBAR SPINE - COMPLETE 4+ VIEW

[t l-spine a.p.]
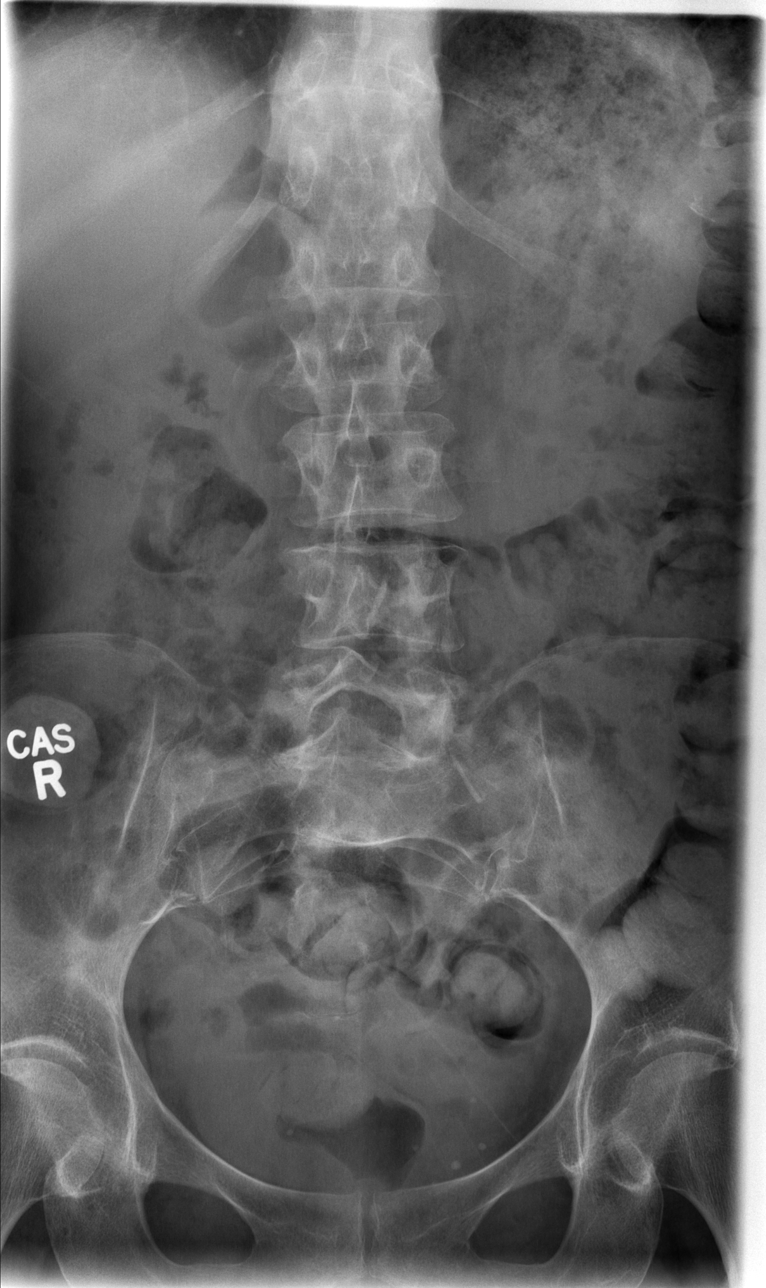

[t l-spine oblique exposure (1 of 2)]
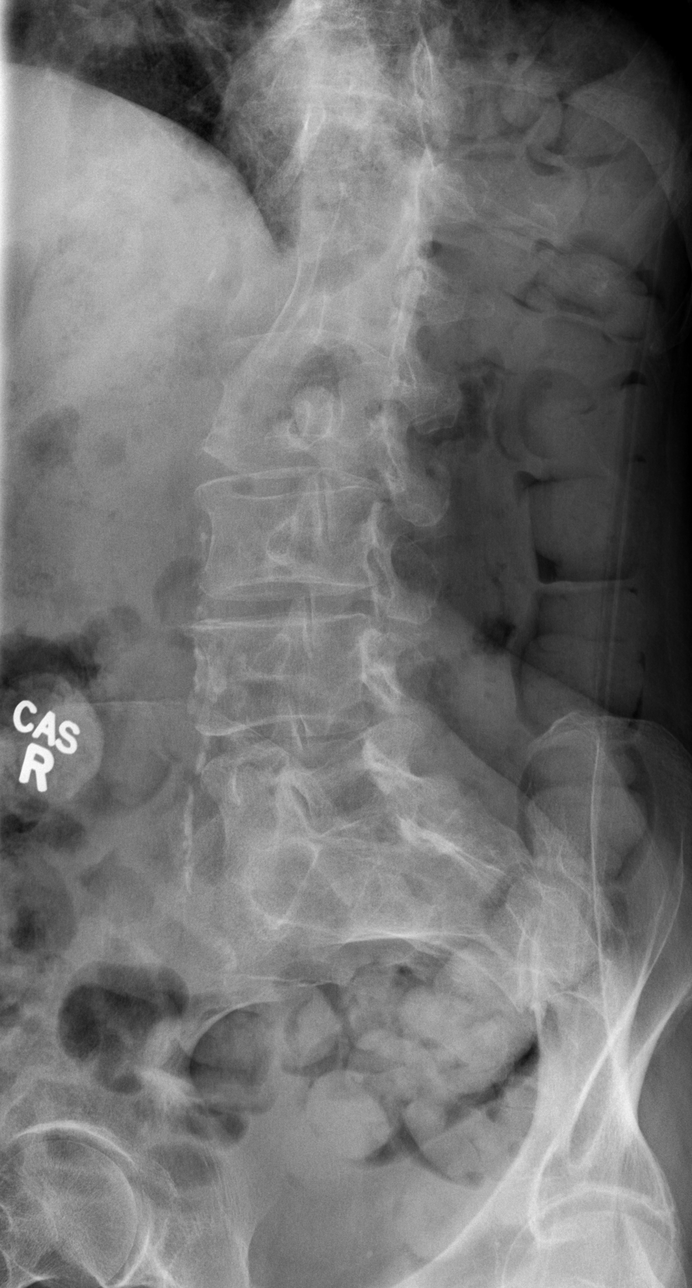

[t l-spine oblique exposure (2 of 2)]
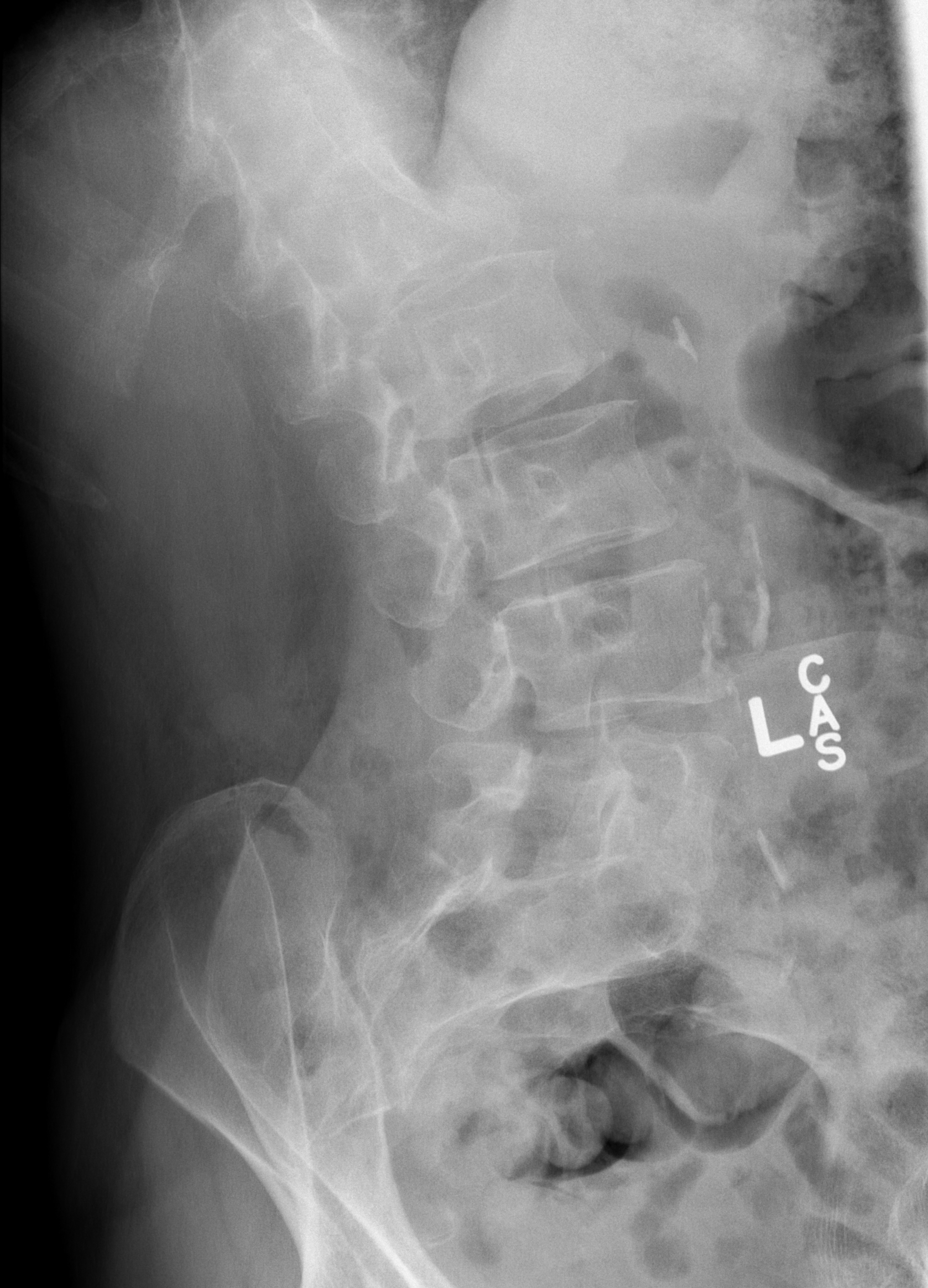

[t l-spine lat]
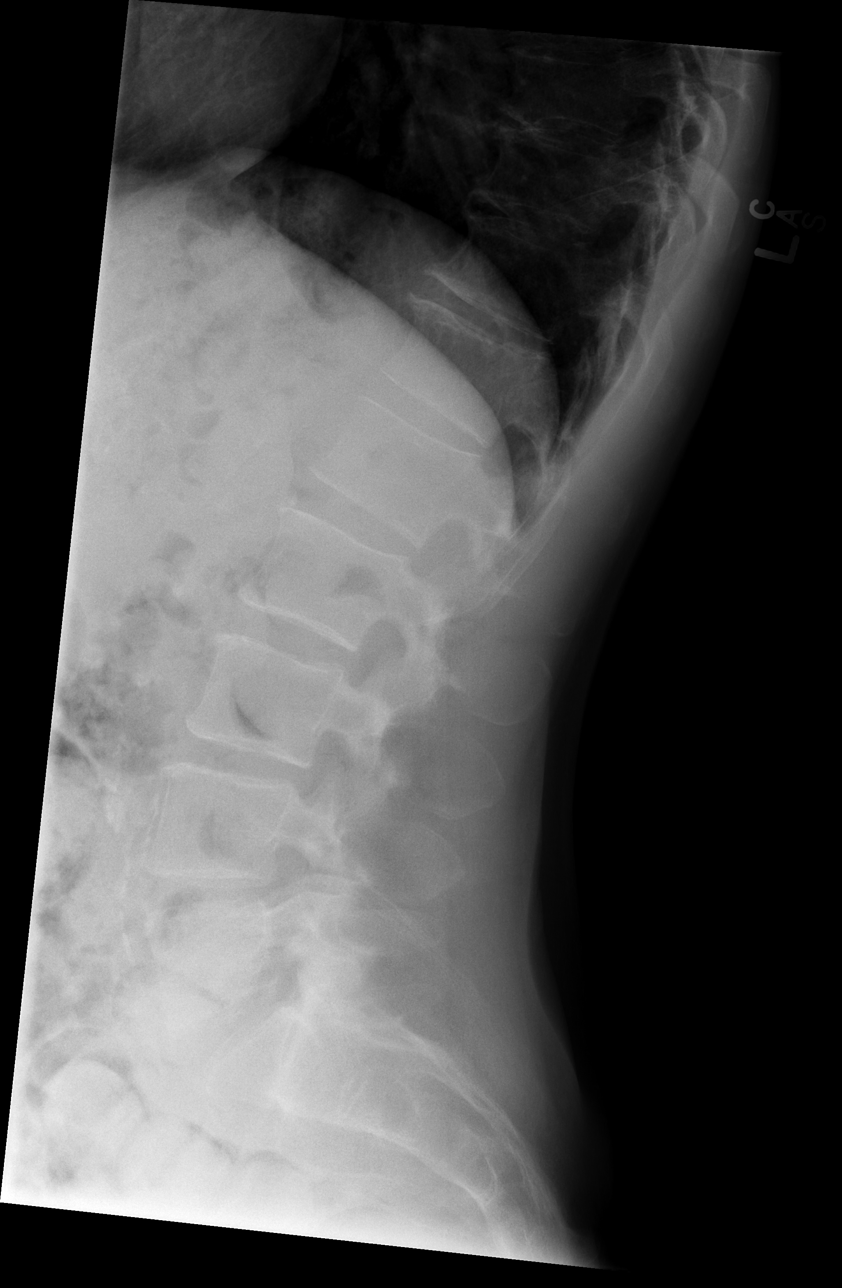

[t l-spine l5-s1 spot]
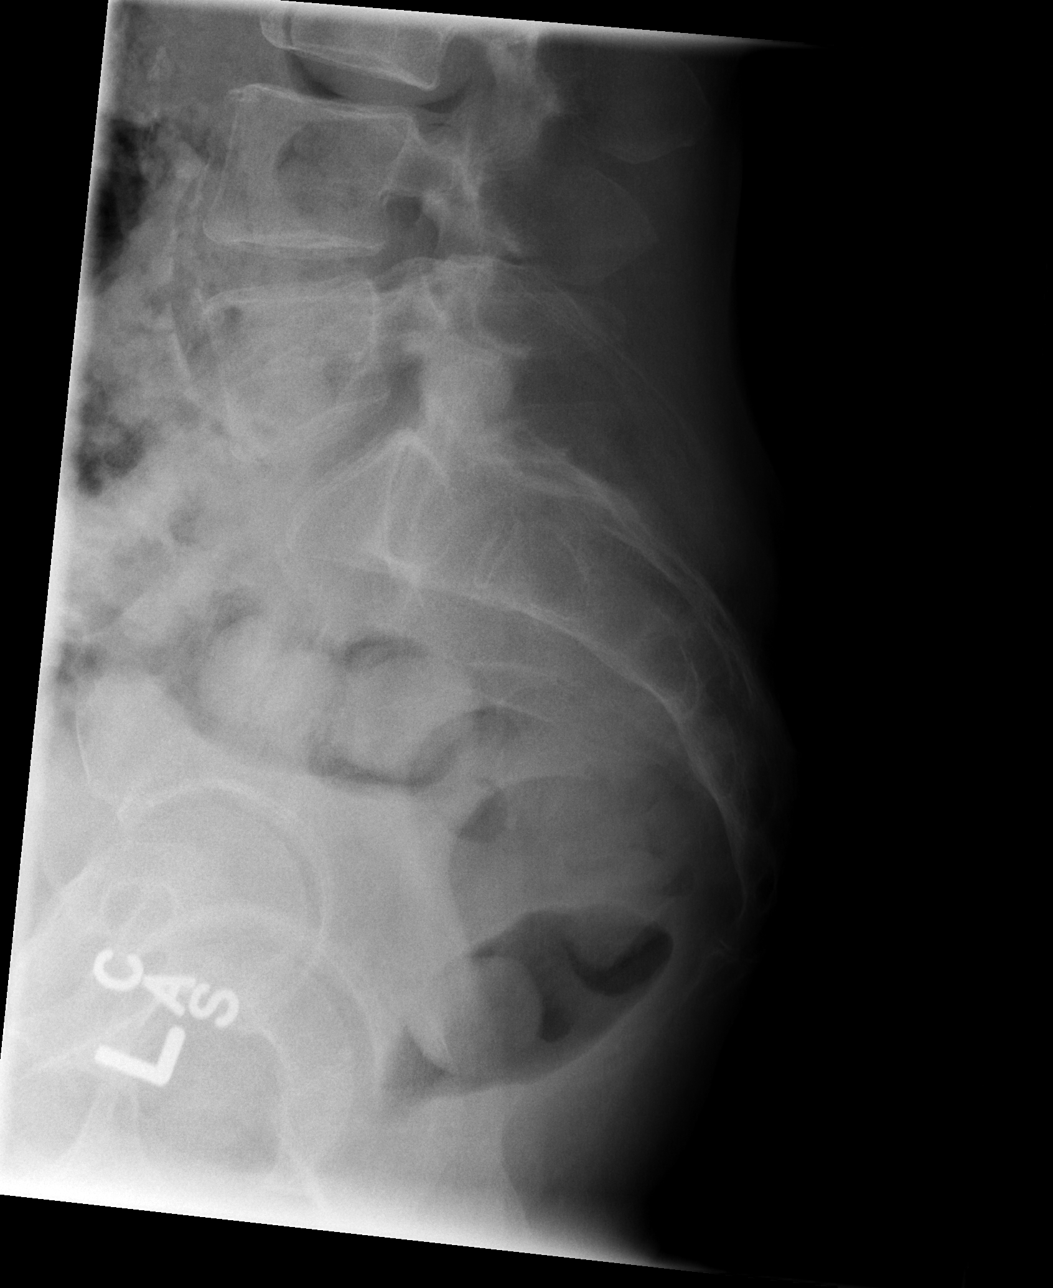

[5 of 5 positions shown; findings below may reference images not displayed]

FINDINGS: No evidence of fracture.  No malalignment.  There is
lower lumbar degenerative disc disease and degenerative facet
disease, mild in degree.
IMPRESSION: No acute or traumatic finding.  Mild lower lumbar degenerative disc
disease and degenerative facet disease.

## 2012-10-04 IMAGING — CR DG HIP (WITH OR WITHOUT PELVIS) 2-3V*L*
3 series · 3 of 3 positions shown · non-contrast
Comparison: 03/05/2009

CLINICAL DATA: Fall.  Pain.

LEFT HIP - COMPLETE 2+ VIEW

[t pelvis a.p.]
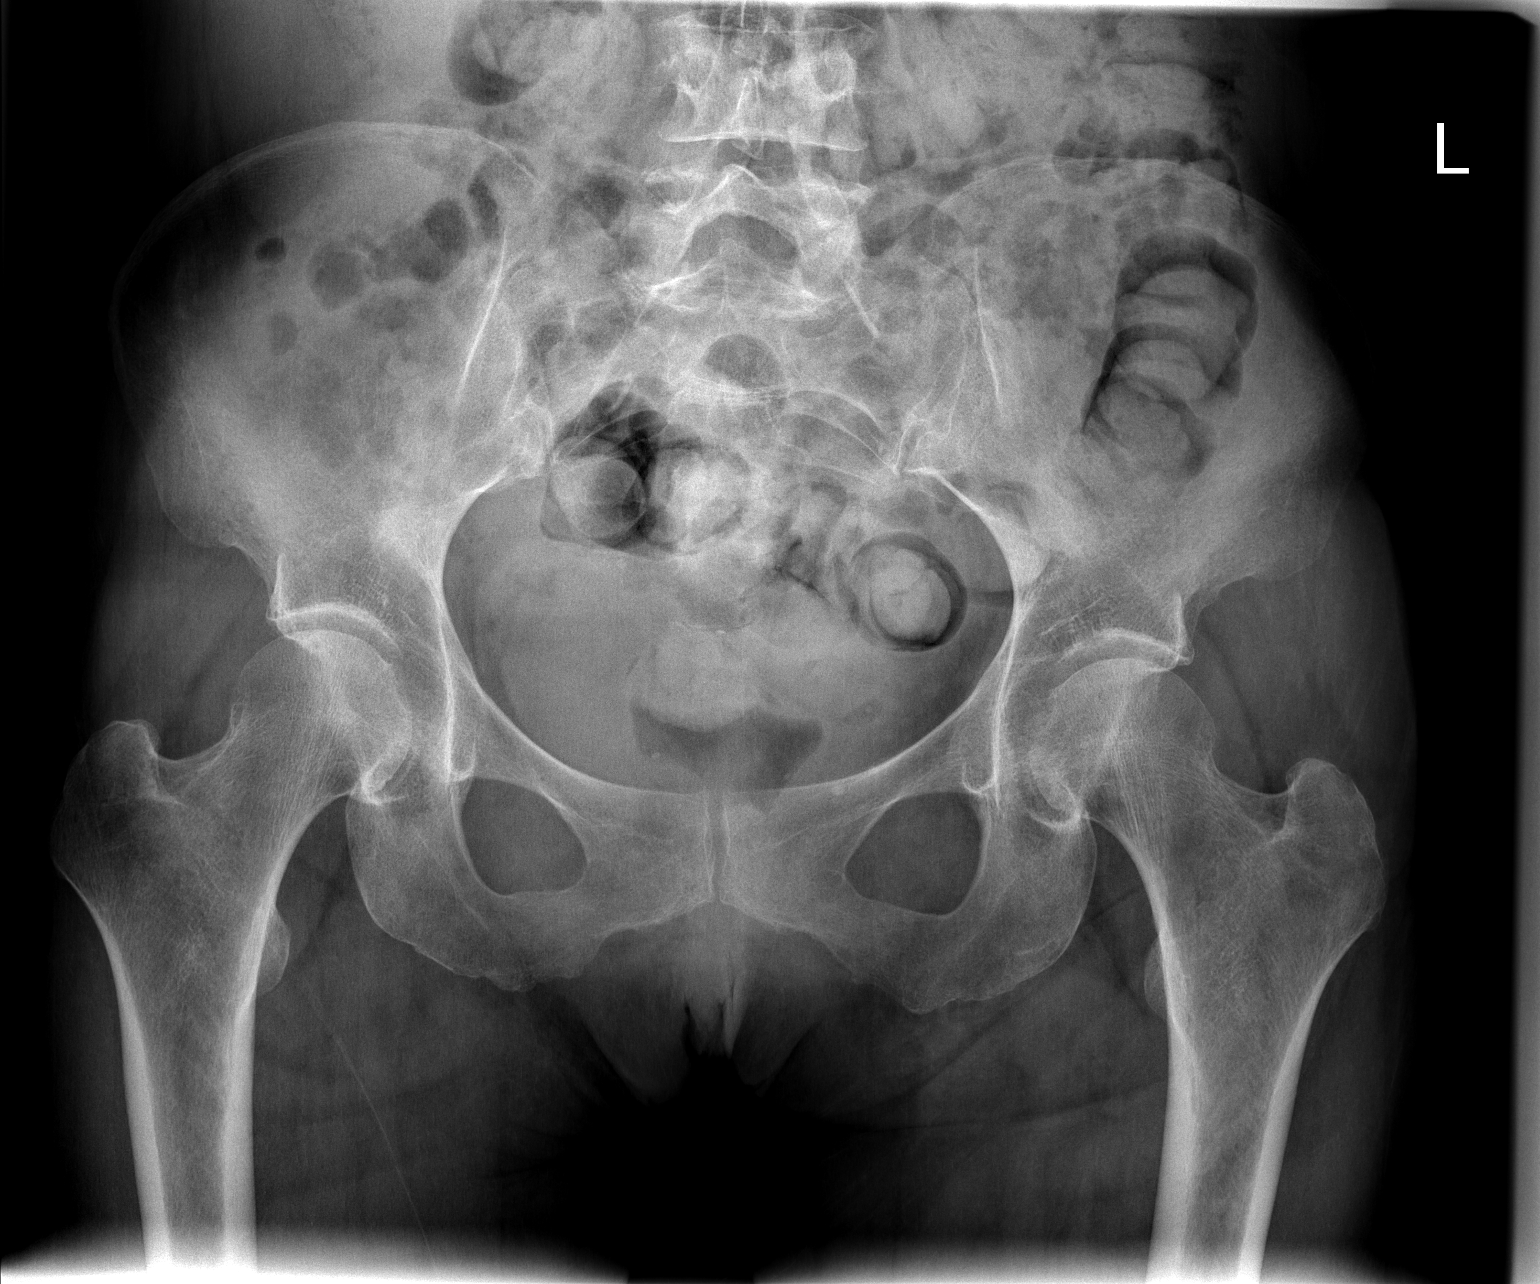

[t hip ap left]
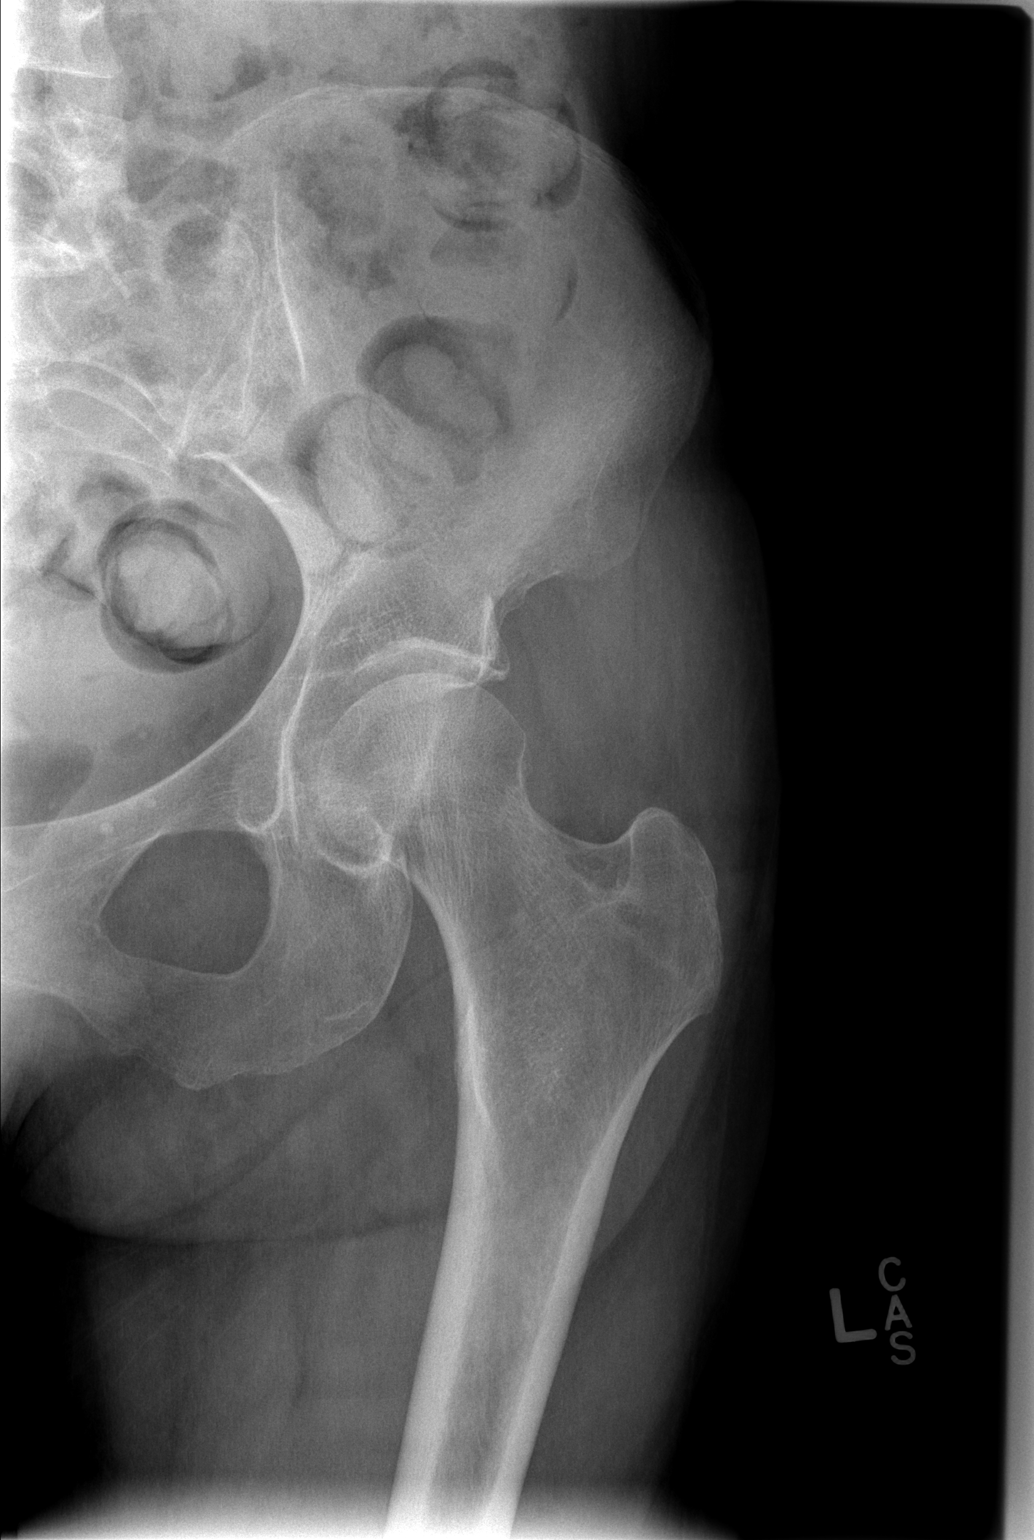

[t hip frog leg left]
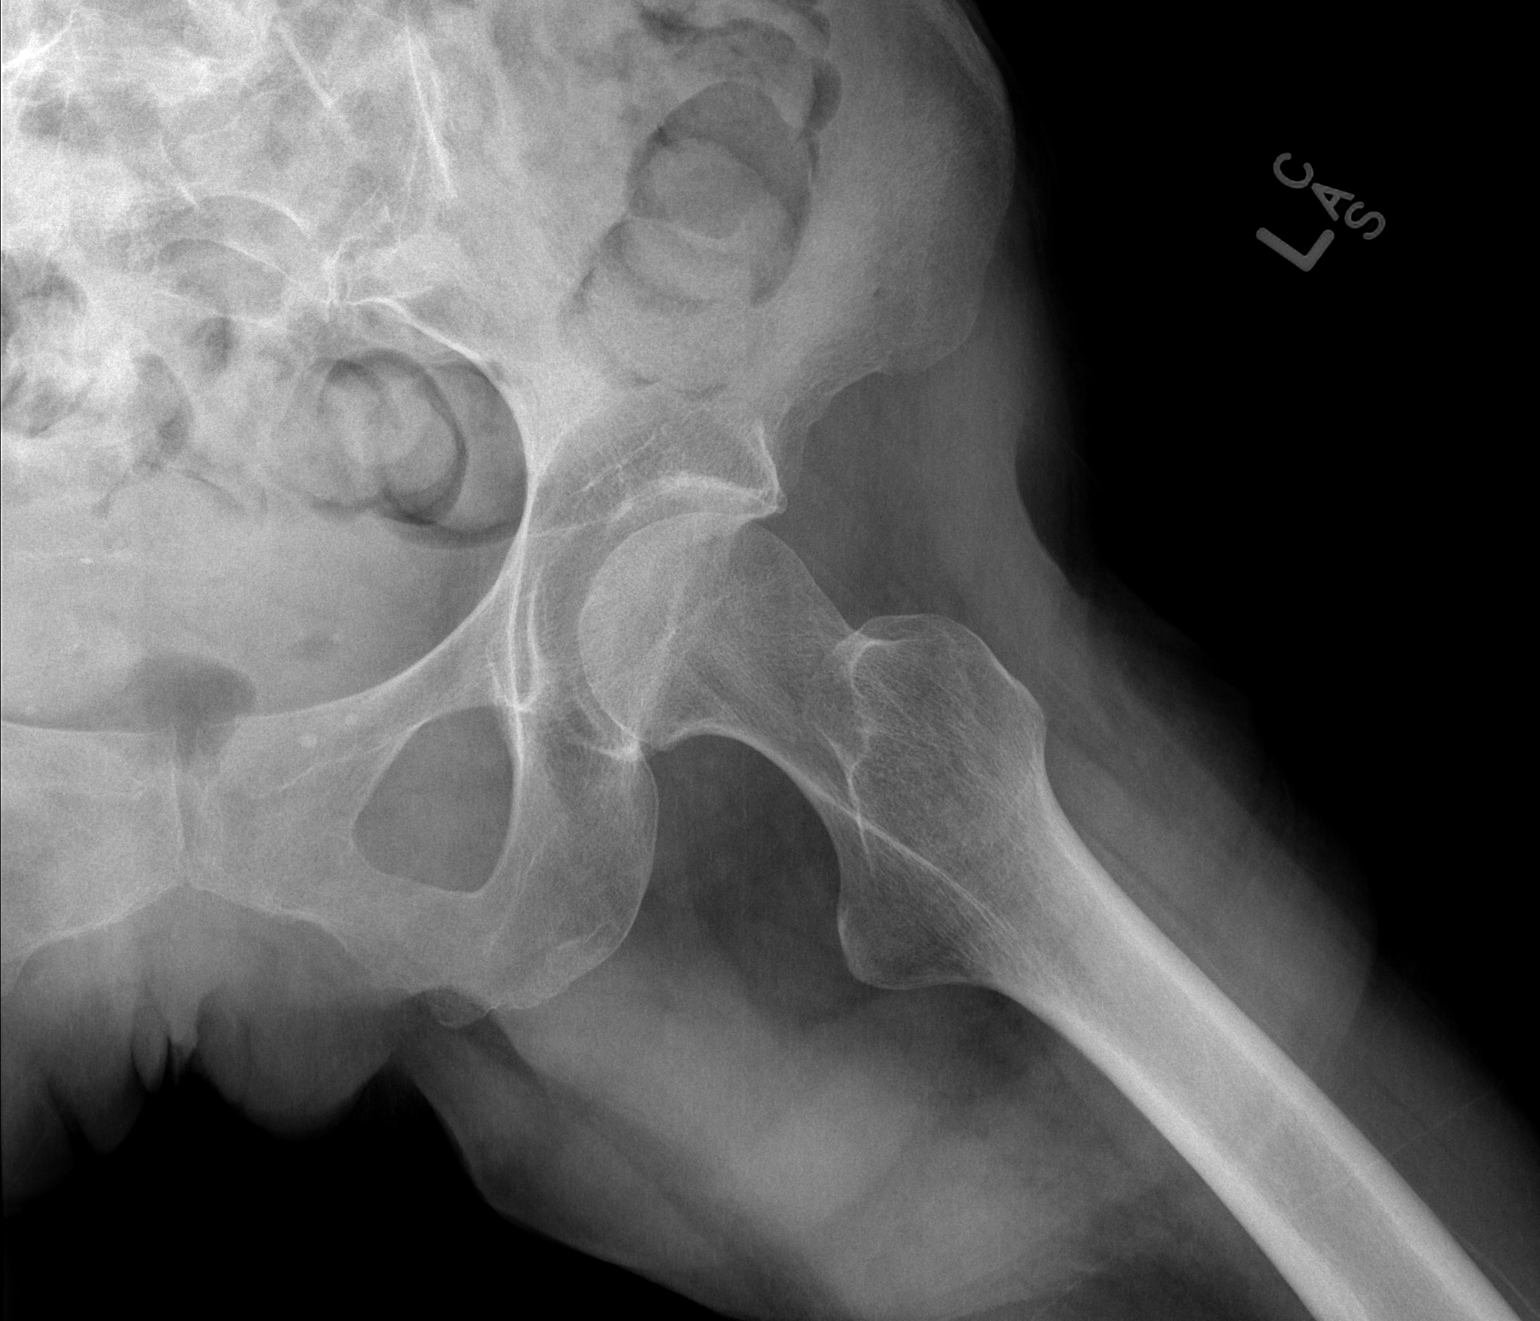

[3 of 3 positions shown; findings below may reference images not displayed]

FINDINGS: No evidence of fracture, dislocation, degenerative change
or other focal lesion.
IMPRESSION: Normal

## 2012-10-04 IMAGING — CR DG SACRUM/COCCYX 2+V
3 series · 3 of 3 positions shown · non-contrast
Comparison: 05/20/2010

CLINICAL DATA: Fall.  Pain.

SACRUM AND COCCYX - 2+ VIEW

[t sacrum a.p.]
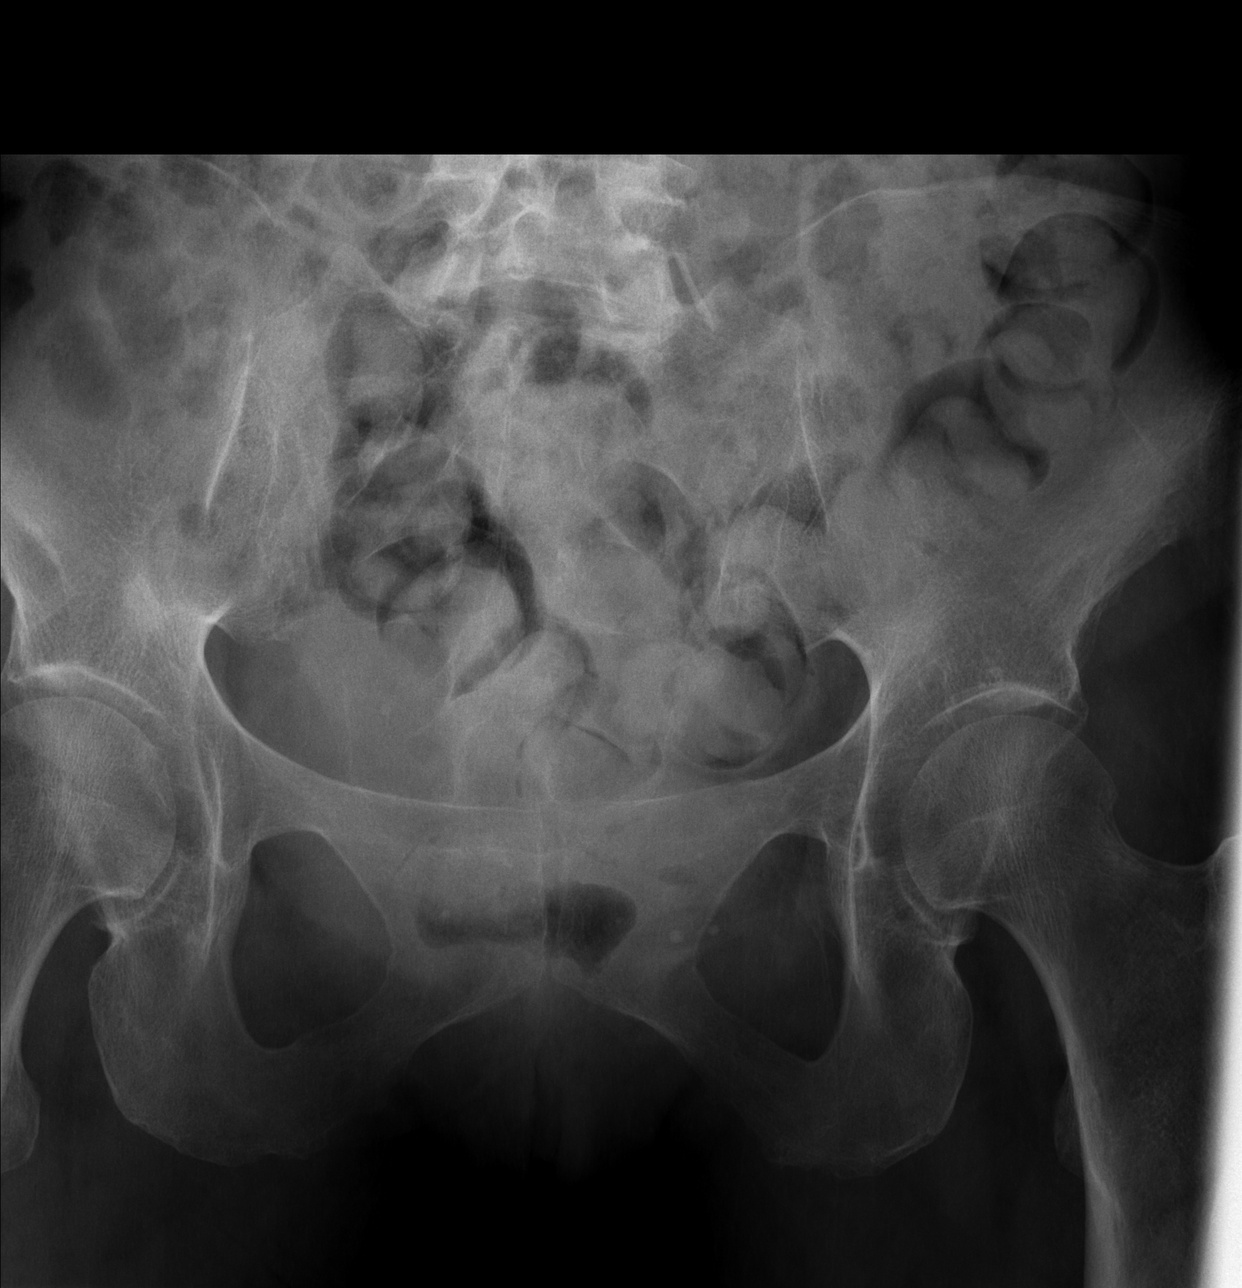

[t coccyx a.p.]
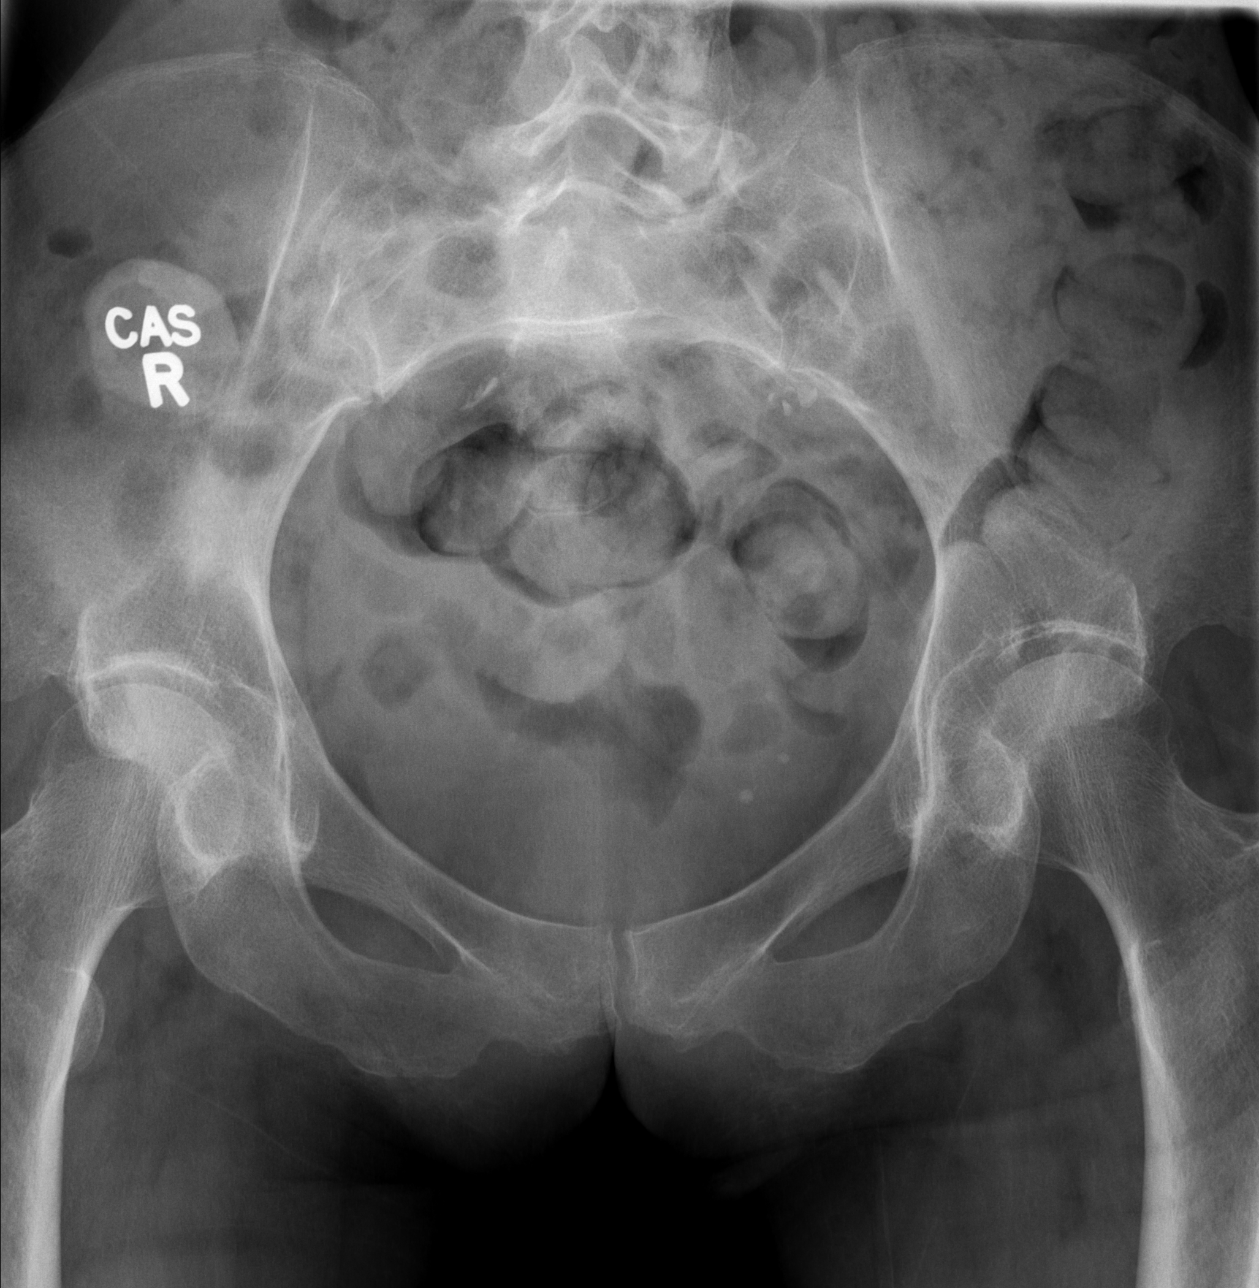

[t sacrum lat]
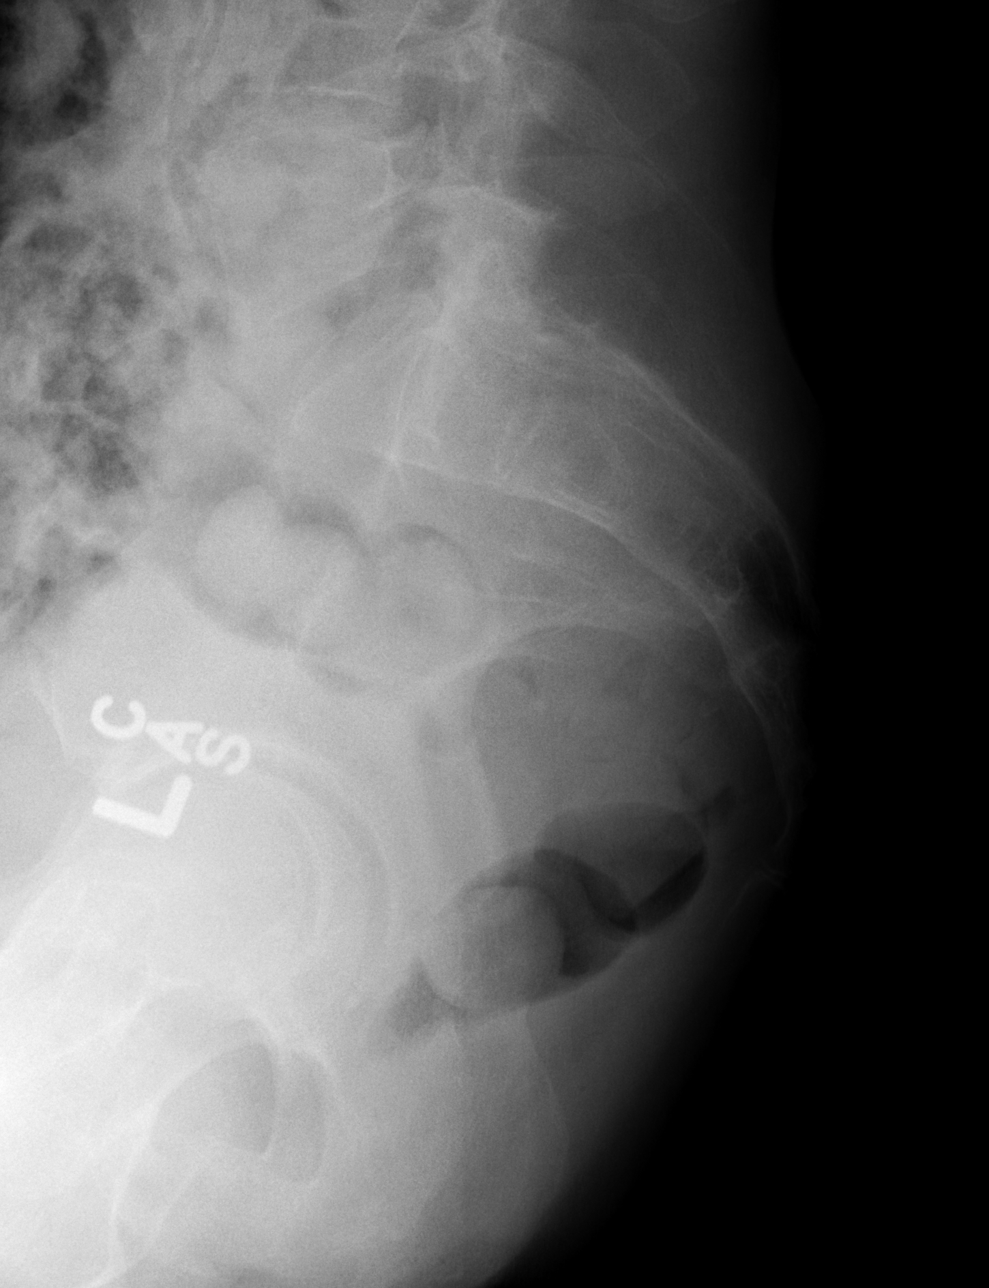

[3 of 3 positions shown; findings below may reference images not displayed]

FINDINGS: No evidence of sacral or coccygeal fracture.
IMPRESSION: Negative

## 2012-11-04 ENCOUNTER — Telehealth: Payer: Self-pay

## 2012-11-04 NOTE — Telephone Encounter (Signed)
Dr. Sherlean Foot, would like to discuss pt's case. Return pager# [phone]

## 2012-12-13 ENCOUNTER — Inpatient Hospital Stay (HOSPITAL_COMMUNITY)
Admission: EM | Admit: 2012-12-13 | Discharge: 2012-12-19 | DRG: 101 | Disposition: A | Discharge status: Home Health | Payer: Medicare Other | Attending: Internal Medicine | Admitting: Internal Medicine

## 2012-12-13 ENCOUNTER — Inpatient Hospital Stay (HOSPITAL_COMMUNITY): Payer: Medicare Other

## 2012-12-13 ENCOUNTER — Encounter (HOSPITAL_COMMUNITY): Payer: Self-pay | Admitting: Emergency Medicine

## 2012-12-13 DIAGNOSIS — T420X5A Adverse effect of hydantoin derivatives, initial encounter: Secondary | ICD-10-CM | POA: Diagnosis present

## 2012-12-13 DIAGNOSIS — R0789 Other chest pain: Secondary | ICD-10-CM

## 2012-12-13 DIAGNOSIS — T426X1A Poisoning by other antiepileptic and sedative-hypnotic drugs, accidental (unintentional), initial encounter: Secondary | ICD-10-CM

## 2012-12-13 DIAGNOSIS — R262 Difficulty in walking, not elsewhere classified: Secondary | ICD-10-CM | POA: Diagnosis present

## 2012-12-13 DIAGNOSIS — F439 Reaction to severe stress, unspecified: Secondary | ICD-10-CM

## 2012-12-13 DIAGNOSIS — Z8673 Personal history of transient ischemic attack (TIA), and cerebral infarction without residual deficits: Secondary | ICD-10-CM

## 2012-12-13 DIAGNOSIS — Z91148 Patient's other noncompliance with medication regimen for other reason: Secondary | ICD-10-CM

## 2012-12-13 DIAGNOSIS — G40802 Other epilepsy, not intractable, without status epilepticus: Secondary | ICD-10-CM

## 2012-12-13 DIAGNOSIS — R509 Fever, unspecified: Secondary | ICD-10-CM

## 2012-12-13 DIAGNOSIS — G40909 Epilepsy, unspecified, not intractable, without status epilepticus: Principal | ICD-10-CM

## 2012-12-13 DIAGNOSIS — I959 Hypotension, unspecified: Secondary | ICD-10-CM

## 2012-12-13 DIAGNOSIS — I629 Nontraumatic intracranial hemorrhage, unspecified: Secondary | ICD-10-CM

## 2012-12-13 DIAGNOSIS — R269 Unspecified abnormalities of gait and mobility: Secondary | ICD-10-CM | POA: Diagnosis present

## 2012-12-13 DIAGNOSIS — F17200 Nicotine dependence, unspecified, uncomplicated: Secondary | ICD-10-CM

## 2012-12-13 DIAGNOSIS — Z86718 Personal history of other venous thrombosis and embolism: Secondary | ICD-10-CM

## 2012-12-13 DIAGNOSIS — Z9114 Patient's other noncompliance with medication regimen: Secondary | ICD-10-CM

## 2012-12-13 DIAGNOSIS — D696 Thrombocytopenia, unspecified: Secondary | ICD-10-CM

## 2012-12-13 DIAGNOSIS — D72819 Decreased white blood cell count, unspecified: Secondary | ICD-10-CM

## 2012-12-13 DIAGNOSIS — T420X1A Poisoning by hydantoin derivatives, accidental (unintentional), initial encounter: Secondary | ICD-10-CM

## 2012-12-13 LAB — CBC WITH DIFFERENTIAL/PLATELET
Eosinophils Absolute: 0.1 10*3/uL (ref 0.0–0.7)
Eosinophils Relative: 2 % (ref 0–5)
HCT: 37.8 % (ref 36.0–46.0)
Hemoglobin: 12.4 g/dL (ref 12.0–15.0)
Lymphocytes Relative: 49 % — ABNORMAL HIGH (ref 12–46)
Lymphs Abs: 1.5 10*3/uL (ref 0.7–4.0)
MCH: 33.2 pg (ref 26.0–34.0)
MCV: 101.1 fL — ABNORMAL HIGH (ref 78.0–100.0)
Monocytes Absolute: 0.2 10*3/uL (ref 0.1–1.0)
Monocytes Relative: 7 % (ref 3–12)
RBC: 3.74 MIL/uL — ABNORMAL LOW (ref 3.87–5.11)
WBC: 3.1 10*3/uL — ABNORMAL LOW (ref 4.0–10.5)

## 2012-12-13 LAB — HEPATIC FUNCTION PANEL
ALT: 9 U/L (ref 0–35)
AST: 17 U/L (ref 0–37)
Albumin: 3.4 g/dL — ABNORMAL LOW (ref 3.5–5.2)
Bilirubin, Direct: <0.1 mg/dL (ref 0.0–0.3)
Total Bilirubin: 0.1 mg/dL — ABNORMAL LOW (ref 0.3–1.2)

## 2012-12-13 LAB — BASIC METABOLIC PANEL
BUN: 10 mg/dL (ref 6–23)
CO2: 29 mEq/L (ref 19–32)
Calcium: 8.5 mg/dL (ref 8.4–10.5)
Creatinine, Ser: 0.56 mg/dL (ref 0.50–1.10)
Glucose, Bld: 96 mg/dL (ref 70–99)
Sodium: 140 mEq/L (ref 135–145)

## 2012-12-13 MED ORDER — SODIUM CHLORIDE 0.9 % IJ SOLN
3.0000 mL | Freq: Two times a day (BID) | INTRAMUSCULAR | Status: DC
  Administered 2012-12-15 – 2012-12-19 (×4): 3 mL via INTRAVENOUS

## 2012-12-13 MED ORDER — SODIUM CHLORIDE 0.9 % IV BOLUS (SEPSIS)
1000.0000 mL | Freq: Once | INTRAVENOUS | Status: AC
  Administered 2012-12-13: 1000 mL via INTRAVENOUS

## 2012-12-13 MED ORDER — HEPARIN SODIUM (PORCINE) 5000 UNIT/ML IJ SOLN
5000.0000 [IU] | Freq: Three times a day (TID) | INTRAMUSCULAR | Status: DC
  Administered 2012-12-13 – 2012-12-19 (×19): 5000 [IU] via SUBCUTANEOUS
  Filled 2012-12-13 (×22): qty 1

## 2012-12-13 MED ORDER — SODIUM CHLORIDE 0.9 % IV SOLN
INTRAVENOUS | Status: DC
  Administered 2012-12-13: 22:00:00 via INTRAVENOUS
  Administered 2012-12-13: 125 mL/h via INTRAVENOUS
  Administered 2012-12-14 (×2): via INTRAVENOUS

## 2012-12-13 NOTE — Progress Notes (Signed)
Patient arrived via stretcher from ED alert and oriented without complaints of pain.  IV fluids infusing via pump, NS at 125/Hr in left hand.  Oriented to room, ordering meals, the need for assistance out of bed, patient verbalized understanding.

## 2012-12-13 NOTE — ED Provider Notes (Signed)
History     CSN: [id number]  Arrival date & time 12/13/12  0403   First MD Initiated Contact with Patient 12/13/12 0403      Chief Complaint  Patient presents with  . Dizziness  . Anxiety    (Consider location/radiation/quality/duration/timing/severity/associated sxs/prior treatment) Patient is a 57 y.o. female presenting with anxiety. The history is provided by the patient.  Anxiety  She noted onset yesterday of feeling dizzy and having difficulty walking. She felt like she was off balance. She denies vertigo denies nausea. During the night, she got up to to the bathroom and was unable to walk without holding onto the wall and even when she did that she would fall. Of note, she has a seizure disorder for which she takes phenytoin. Symptoms are severe and worse with any attempt to move. Nothing has made it any better.  Past Medical History  Diagnosis Date  . Seizures   . DVT (deep venous thrombosis)   . Asthma   . Cerebral hemorrhage     Past Surgical History  Procedure Laterality Date  . Cesarean section    . Brain surgery      No family history on file.  History  Substance Use Topics  . Smoking status: Current Every Day Smoker  . Smokeless tobacco: Not on file  . Alcohol Use: No    OB History   Grav Para Term Preterm Abortions TAB SAB Ect Mult Living                  Review of Systems  All other systems reviewed and are negative.    Allergies  Codeine and Keppra  Home Medications   Current Outpatient Rx  Name  Route  Sig  Dispense  Refill  . phenytoin (DILANTIN) 100 MG ER capsule   Oral   Take 200 mg by mouth 2 (two) times daily.          Marland Kitchen albuterol (PROVENTIL HFA;VENTOLIN HFA) 108 (90 BASE) MCG/ACT inhaler   Inhalation   Inhale 2 puffs into the lungs every 4 (four) hours as needed. For shortness of breath           BP 114/47  Pulse 69  Temp(Src) 98.7 F (37.1 C) (Oral)  Resp 20  SpO2 94%  Physical Exam  Nursing note and vitals  reviewed.  57 year old female, resting comfortably and in no acute distress. Vital signs are normal. Oxygen saturation is 94%, which is normal. Head is normocephalic and atraumatic. PERRLA, EOMI. Oropharynx is clear. Prominent nystagmus is present. Neck is nontender and supple without adenopathy or JVD. Back is nontender and there is no CVA tenderness. Lungs are clear without rales, wheezes, or rhonchi. Chest is nontender. Heart has regular rate and rhythm without murmur. Abdomen is soft, flat, nontender without masses or hepatosplenomegaly and peristalsis is normoactive. Extremities have no cyanosis or edema, full range of motion is present. Skin is warm and dry without rash. Neurologic: Mental status is normal, cranial nerves are intact, there are no motor or sensory deficits. Marked ataxia is noted on finger to nose testing.  ED Course  Procedures (including critical care time)  Results for orders placed during the hospital encounter of 12/13/12  CBC WITH DIFFERENTIAL      Result Value Range   WBC 3.1 (*) 4.0 - 10.5 K/uL   RBC 3.74 (*) 3.87 - 5.11 MIL/uL   Hemoglobin 12.4  12.0 - 15.0 g/dL   HCT 82.9  56.2 - 13.0 %  MCV 101.1 (*) 78.0 - 100.0 fL   MCH 33.2  26.0 - 34.0 pg   MCHC 32.8  30.0 - 36.0 g/dL   RDW 16.1  09.6 - 04.5 %   Platelets 130 (*) 150 - 400 K/uL   Neutrophils Relative 42 (*) 43 - 77 %   Neutro Abs 1.3 (*) 1.7 - 7.7 K/uL   Lymphocytes Relative 49 (*) 12 - 46 %   Lymphs Abs 1.5  0.7 - 4.0 K/uL   Monocytes Relative 7  3 - 12 %   Monocytes Absolute 0.2  0.1 - 1.0 K/uL   Eosinophils Relative 2  0 - 5 %   Eosinophils Absolute 0.1  0.0 - 0.7 K/uL   Basophils Relative 0  0 - 1 %   Basophils Absolute 0.0  0.0 - 0.1 K/uL  BASIC METABOLIC PANEL      Result Value Range   Sodium 140  135 - 145 mEq/L   Potassium 3.7  3.5 - 5.1 mEq/L   Chloride 104  96 - 112 mEq/L   CO2 29  19 - 32 mEq/L   Glucose, Bld 96  70 - 99 mg/dL   BUN 10  6 - 23 mg/dL   Creatinine, Ser 4.09   0.50 - 1.10 mg/dL   Calcium 8.5  8.4 - 81.1 mg/dL   GFR calc non Af Amer >90  >90 mL/min   GFR calc Af Amer >90  >90 mL/min  PHENYTOIN LEVEL, TOTAL      Result Value Range   Phenytoin Lvl 42.3 (*) 10.0 - 20.0 ug/mL   ECG shows normal sinus rhythm with a rate of 64, no ectopy. Normal axis. Normal P wave. Normal QRS. Normal intervals. Normal ST and T waves. Impression: normal ECG. When compared with ECG of 02/28/2012, no significant changes are seen.   1. Dilantin toxicity, initial encounter   2. Thrombocytopenia        MDM  Ataxia which is likely due 2 phenytoin toxicity. Old records have been reviewed and she has had to be admitted to the hospital because of phenytoin toxicity in the past. Phenytoin level has been ordered.  Dilantin level is come back toxic at 42.3. She's given IV fluids and case is discussed with Dr. Elvera Lennox of [facility] who agrees to admit the patient. He is coming to the ED to evaluate the patient. Also, incidental finding of thrombocytopenia is noted.  Dione Booze, MD 12/13/12 [id number]

## 2012-12-13 NOTE — H&P (Signed)
[facility] History and Physical  [name]:[id number] DOB: May 05, 1956 DOA: 12/13/2012  Referring physician: Dr. Preston Fleeting PCP: Pearson Grippe, MD  Specialists: Neurology, Dr. Thad Ranger  Chief Complaint: difficulty walking  HPI: [name] is a 57 y.o. female has a past medical history significant for seizure disorder following prior history of intracranial bleed and surgery about 24 years ago, presents with a chief complaint of difficulty walking, problems with her balance. Her symptoms are severe and worse with any attempts to move. She does have a history of Dilantin toxicity in the past, and her symptoms remind her of this. She is on 100 mg of Dilantin twice daily and has been on this medication for a period of time. She reports compliance with the medication, and she takes these meds at the same time every day. She is to be on Vimpat in the past, however this medication was discontinued about 2 months ago at [facility]. She was prescribed for her seizures a medication to replace Vimpat (she does not recall the name, but its a green and white medication - medication which he did not tolerate), so currently she only takes Dilantin. Last seizure was in November of 2013, she was admitted here. She denies drinking alcohol doing any other drugs. Denies fevers or chills, she denies chest pain, abdominal pain, she denies nausea, vomiting or diarrhea.  Review of Systems: The patient denies anorexia, fever, weight loss, vision loss, decreased hearing, hoarseness, chest pain, syncope, dyspnea on exertion, peripheral edema, hemoptysis, abdominal pain, melena, hematochezia, severe indigestion/heartburn, hematuria, incontinence, genital sores, muscle weakness, suspicious skin lesions, transient blindness, depression, unusual weight change, abnormal bleeding, enlarged lymph nodes, angioedema, and breast masses.   Past Medical History  Diagnosis Date  . Seizures   . DVT (deep venous thrombosis)   .  Asthma   . Cerebral hemorrhage    Past Surgical History  Procedure Laterality Date  . Cesarean section    . Brain surgery     Social History:  reports that she has been smoking.  She does not have any smokeless tobacco history on file. She reports that she does not drink alcohol or use illicit drugs.  Allergies  Allergen Reactions  . Codeine Nausea And Vomiting  . Keppra (Levetiracetam) Other (See Comments)    Causes seizures.     Family history reviewed and noncontributory.  Prior to Admission medications   Medication Sig Start Date End Date Taking? Authorizing Provider  phenytoin (DILANTIN) 100 MG ER capsule Take 200 mg by mouth 2 (two) times daily.  03/03/12  Yes Osvaldo Shipper, MD  albuterol (PROVENTIL HFA;VENTOLIN HFA) 108 (90 BASE) MCG/ACT inhaler Inhale 2 puffs into the lungs every 4 (four) hours as needed. For shortness of breath    Historical Provider, MD   Physical Exam: Filed Vitals:   12/13/12 0407 12/13/12 0500 12/13/12 0600 12/13/12 0700  BP: 114/47 108/61 102/58 108/59  Pulse: 69 63 65 66  Temp: 98.7 F (37.1 C)     TempSrc: Oral     Resp: 20 19 18 19   SpO2: 94% 91% 92% 91%     General:  No apparent distress, tearful at times  Eyes: PERRL, EOMI, no scleral icterus, horizontal nystagmus present  ENT: moist oropharynx  Neck: supple, no JVD  Cardiovascular: regular rate without MRG; 2+ peripheral pulses  Respiratory: CTA biL, good air movement without wheezing, rhonchi or crackled  Abdomen: soft, non tender to palpation, positive bowel sounds, no guarding, no rebound  Skin: no  rashes  Musculoskeletal: no peripheral edema  Psychiatric: normal mood and affect  Neurologic: CN 2-12 grossly intact, MS 5/5 in all 4, mild FTN impairment  Labs on Admission:  Basic Metabolic Panel:  Recent Labs Lab 12/13/12 0500  NA 140  K 3.7  CL 104  CO2 29  GLUCOSE 96  BUN 10  CREATININE 0.56  CALCIUM 8.5   CBC:  Recent Labs Lab 12/13/12 0500  WBC  3.1*  NEUTROABS 1.3*  HGB 12.4  HCT 37.8  MCV 101.1*  PLT 130*   Radiological Exams on Admission: Ct Head Wo Contrast  12/13/2012  *RADIOLOGY REPORT*  Clinical Data: Dizziness, seizure disorder, remote intracranial hemorrhage.  CT HEAD WITHOUT CONTRAST  Technique:  Contiguous axial images were obtained from the base of the skull through the vertex without contrast.  Comparison: 09/09/2012  Findings: Previous right occipital craniotomy with extensive parenchymal loss in the right occipital lobe as before.  Up to 7 mm depression of the bone flap as before. Stable ex vacuo dilatation of the occipital horn right lateral ventricle.  The Atherosclerotic and physiologic intracranial calcifications. There is no evidence of acute intracranial hemorrhage, brain edema, mass lesion, acute infarction,   mass effect, or midline shift. Acute infarct may be inapparent on noncontrast CT.  No other intra-axial abnormalities are seen, and the ventricles and sulci are within normal limits in size and symmetry.   No abnormal extra-axial fluid collections or masses are identified.  No significant calvarial abnormality.  IMPRESSION: 1. Negative for bleed or other acute intracranial process. 2.  Stable postoperative changes.   Original Report Authenticated By: D. Andria Rhein, MD     EKG: Independently reviewed.  Assessment/Plan Active Problems:   Dilantin toxicity   Seizure disorder   History of Intracranial bleed   Thrombocytopenia- chronic   Dilantin toxicity  - IVF, monitor levels, telemetry, seizure precautions - admit inpatient to telemetry - neurology consulted; will ask pharmacy for help with dosing - dilantin level tomorrow  Seizure disorder Hx intracranial bleed Chronic thrombocytopenia - platelets 130, close to baseline, monitor.  DVT Prophylaxis - heparin s.q.  Code Status: Full  Family Communication: son bedside  Disposition Plan: admit telemetry  Time spent: 45  Gorgeous Newlun M. Elvera Lennox,  MD [facility] Pager [phone]  If 7PM-7AM, please contact night-coverage [email or web address] Password [facility] 12/13/2012, 7:42 AM

## 2012-12-13 NOTE — Consult Note (Signed)
Reason for Consult:Seizures, dilantin toxicity Referring Physician: Elvera Lennox  CC: Seizures, off balance  HPI: [name] is an 57 y.o. female with a long history of seizures. 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 Has been on Phenobarbital, Mysoline, Depakote, Vimpat and Keppra in the past. This summer had a fall that resulted in a skull fracture. Dilantin level was supratherapeutic at that time.  Was supposed to be on another medication prescribed by her outpatient neurologist but was unable to get this filled.  She does not recall what the medication was and therefore restarted her Dilantin at 400mg  a day.  On her last admission in December of last year the patient had Dilantin toxicity on that dosage as well. Per report of the patient on yesterday her family began to ask her if she was tired or if she had a seizure.  At one point someone suggested that she ha d been drinking.   She was not feeling her best but otherwise did well.  This morning awakened and felt as if she was drunk.  Was unable to keep her balance.  Was brought in for evaluation and found to have a Dilantin level greater than 40.      Past Medical History  Diagnosis Date  . Seizures   . DVT (deep venous thrombosis)   . Asthma   . Cerebral hemorrhage     Past Surgical History  Procedure Laterality Date  . Cesarean section    . Brain surgery      Family History  Problem Relation Age of Onset  . Heart attack Mother   . Heart attack Father     Social History:  reports that she has been smoking.  She has never used smokeless tobacco. She reports that she does not drink alcohol or use illicit drugs.  Allergies  Allergen Reactions  . Codeine Nausea And Vomiting  . Keppra (Levetiracetam) Other (See Comments)    Causes seizures.   [name] Rash    Medications:  I have reviewed the patient's current medications. Prior to Admission:   Prescriptions prior to admission  Medication Sig Dispense Refill  . phenytoin (DILANTIN) 100 MG ER capsule Take 200 mg by mouth 2 (two) times daily.       Marland Kitchen albuterol (PROVENTIL HFA;VENTOLIN HFA) 108 (90 BASE) MCG/ACT inhaler Inhale 2 puffs into the lungs every 4 (four) hours as needed. For shortness of breath       Scheduled: . heparin subcutaneous  5,000 Units Subcutaneous Q8H  . sodium chloride  3 mL Intravenous Q12H    ROS: History obtained from the patient  General ROS: negative for - chills, fatigue, fever, night sweats, weight gain or weight loss Psychological ROS: negative for - behavioral disorder, hallucinations, memory difficulties, mood swings or suicidal ideation Ophthalmic ROS: negative for - blurry vision, double vision, eye pain or loss of vision ENT ROS: negative for - epistaxis, nasal discharge, oral lesions, sore throat, tinnitus or vertigo Allergy and Immunology ROS: negative for - hives or itchy/watery eyes Hematological and Lymphatic ROS: negative for - bleeding problems, bruising or swollen lymph nodes Endocrine ROS: negative for - galactorrhea, hair pattern changes, polydipsia/polyuria or temperature intolerance Respiratory ROS: negative for - cough, hemoptysis, shortness of breath or wheezing Cardiovascular ROS: negative for - chest pain, dyspnea on exertion, edema or irregular heartbeat Gastrointestinal ROS: negative for - abdominal pain, diarrhea, hematemesis, nausea/vomiting or stool incontinence Genito-Urinary ROS: negative for -  dysuria, hematuria, incontinence or urinary frequency/urgency Musculoskeletal ROS: negative for - joint swelling or muscular weakness Neurological ROS: as noted in HPI Dermatological ROS: negative for rash and skin lesion changes  Physical Examination: Blood pressure 101/60, pulse 70, temperature 98 F (36.7 C), temperature source Oral, resp. rate 18, SpO2 95.00%.  Neurologic Examination Mental Status: Alert, oriented, thought  content appropriate.  Speech fluent without evidence of aphasia.  Able to follow 3 step commands without difficulty. Cranial Nerves: II: Discs flat bilaterally; Visual fields grossly normal, pupils equal, round, reactive to light and accommodation III,IV, VI: ptosis not present, extra-ocular motions intact bilaterally with nystagmus bilaterally V,VII: smile symmetric, facial light touch sensation normal bilaterally VIII: hearing normal bilaterally IX,X: gag reflex present XI: bilateral shoulder shrug XII: midline tongue extension Motor: Right : Upper extremity   5/5    Left:     Upper extremity   5/5  Lower extremity   5/5     Lower extremity   5/5 Tone and bulk:normal tone throughout; no atrophy noted Sensory: Pinprick and light touch intact throughout, bilaterally Deep Tendon Reflexes: 2+ and symmetric throughout Plantars: Right: upgoing   Left: upgoing Cerebellar: Mild inaccuracy with finger to nose bilaterally.  Heel to shin intact.   Gait: Unable to test CV: pulses palpable throughout     Laboratory Studies:   Basic Metabolic Panel:  Recent Labs Lab 12/13/12 0500  NA 140  K 3.7  CL 104  CO2 29  GLUCOSE 96  BUN 10  CREATININE 0.56  CALCIUM 8.5    Liver Function Tests:  Recent Labs Lab 12/13/12 0500  AST 17  ALT 9  ALKPHOS 66  BILITOT 0.1*  PROT 5.9*  ALBUMIN 3.4*   No results found for this basename: LIPASE, AMYLASE,  in the last 168 hours No results found for this basename: AMMONIA,  in the last 168 hours  CBC:  Recent Labs Lab 12/13/12 0500  WBC 3.1*  NEUTROABS 1.3*  HGB 12.4  HCT 37.8  MCV 101.1*  PLT 130*    Cardiac Enzymes: No results found for this basename: CKTOTAL, CKMB, CKMBINDEX, TROPONINI,  in the last 168 hours  BNP: No components found with this basename: POCBNP,   CBG: No results found for this basename: GLUCAP,  in the last 168 hours  Microbiology: Results for orders placed during the hospital encounter of 06/28/12   MRSA PCR SCREENING     Status: None   Collection Time    06/28/12  7:39 PM      Result Value Range Status   MRSA by PCR NEGATIVE  NEGATIVE Final   Comment:            The GeneXpert MRSA Assay (FDA     approved for NASAL specimens     only), is one component of a     comprehensive MRSA colonization     surveillance program. It is not     intended to diagnose MRSA     infection nor to guide or     monitor treatment for     MRSA infections.    Coagulation Studies: No results found for this basename: LABPROT, INR,  in the last 72 hours  Urinalysis: No results found for this basename: COLORURINE, APPERANCEUR, LABSPEC, PHURINE, GLUCOSEU, HGBUR, BILIRUBINUR, KETONESUR, PROTEINUR, UROBILINOGEN, NITRITE, LEUKOCYTESUR,  in the last 168 hours  Lipid Panel:  No results found for this basename: chol, trig, hdl, cholhdl, vldl, ldlcalc    HgbA1C:  Lab Results  Component  Value Date   HGBA1C 5.3 03/02/2012    Urine Drug Screen:     Component Value Date/Time   LABOPIA NONE DETECTED 09/09/2012 0615   COCAINSCRNUR NONE DETECTED 09/09/2012 0615   LABBENZ NONE DETECTED 09/09/2012 0615   AMPHETMU NONE DETECTED 09/09/2012 0615   THCU NONE DETECTED 09/09/2012 0615   LABBARB NONE DETECTED 09/09/2012 0615    Alcohol Level: No results found for this basename: ETH,  in the last 168 hours  Other results: EKG: NSR at 64 bpm  Imaging: Ct Head Wo Contrast  12/13/2012  *RADIOLOGY REPORT*  Clinical Data: Dizziness, seizure disorder, remote intracranial hemorrhage.  CT HEAD WITHOUT CONTRAST  Technique:  Contiguous axial images were obtained from the base of the skull through the vertex without contrast.  Comparison: 09/09/2012  Findings: Previous right occipital craniotomy with extensive parenchymal loss in the right occipital lobe as before.  Up to 7 mm depression of the bone flap as before. Stable ex vacuo dilatation of the occipital horn right lateral ventricle.  The Atherosclerotic and physiologic intracranial  calcifications. There is no evidence of acute intracranial hemorrhage, brain edema, mass lesion, acute infarction,   mass effect, or midline shift. Acute infarct may be inapparent on noncontrast CT.  No other intra-axial abnormalities are seen, and the ventricles and sulci are within normal limits in size and symmetry.   No abnormal extra-axial fluid collections or masses are identified.  No significant calvarial abnormality.  IMPRESSION: 1. Negative for bleed or other acute intracranial process. 2.  Stable postoperative changes.   Original Report Authenticated By: D. Andria Rhein, MD      Assessment/Plan: 57 year old female with history of seizures on Dilantin.  Dilantin level supratherapeutic.  Was in the process of coming off Dilantin due to incomplete seizure control.  Unclear what medication was to replace Dilantin.  Recommendations: 1.  Seizure precautions 2.  Dilantin level daily 3.  Hold Dilantin until level therapeutic 4.  Once Dilantin therapeutic would restart at 300mg  a day.    Thana Farr, MD [facility] [phone] 12/13/2012, 3:37 PM

## 2012-12-13 NOTE — Care Management Note (Addendum)
    Page 1 of 2   12/19/2012     5:49:13 PM   CARE MANAGEMENT NOTE 12/19/2012  Patient:  [name], [name]   Account Number:  [id number]  Date Initiated:  12/13/2012  Documentation initiated by:  Lanier Clam  Subjective/Objective Assessment:   ADMITTED W/WEAKNESS.DILANTIN TOXICITY.ZO:[id number] BLEED.     Action/Plan:   FROM HOME.HAS PCP,PHARMACY.   Anticipated DC Date:  12/19/2012   Anticipated DC Plan:  HOME W HOME HEALTH SERVICES      DC Planning Services  CM consult      Choice offered to / List presented to:  C-1 Patient        HH arranged  HH-2 PT      [facility] agency  [facility]   Status of service:  Completed, signed off Medicare Important Message given?   (If response is "NO", the following Medicare IM given date fields will be blank) Date Medicare IM given:   Date Additional Medicare IM given:    Discharge Disposition:  HOME W HOME HEALTH SERVICES  Per UR Regulation:  Reviewed for med. necessity/level of care/duration of stay  If discussed at Long Length of Stay Meetings, dates discussed:    Comments:  12/19/12 Trase Bunda RN,BSN NCM WEEKEND [phone] TC [facility] AWARE OF D/C HOME W/HHPT ORDER.  12/17/12 Joie Reamer RN,BSN NCM [phone] [facility] CHOSEN FOR HH,KRISTEN(REP) AWARE & FOLLOWING. NEURO FOLLOWING FOR SEIZURES,MEDS ADJUSTED.PT-HH.PROVIDED W/HHC AGENCY LIST.  12/16/12 Tasnim Balentine RN,BSN NCM [phone] ACTIVE SEIZURE TODAY.NEURO CONS.PT CONS.  12/14/12 Torianna Junio RN,BSN NCM [phone] ONFI(CLOBAZAM)10MG  TAB-COVERED AS TIER 4-$95. KEPPRA/GENERIC 250MG  TAB COVERED AS A TIER 2-COPAY $6.  12/13/12 Markees Carns RN,BSN NCM [phone]

## 2012-12-13 NOTE — ED Notes (Signed)
ZOX:[id number]<AV> Expected date:<BR> Expected time:<BR> Means of arrival:<BR> Comments:<BR> EMS, 56yo F, dizziness

## 2012-12-13 NOTE — ED Notes (Signed)
Attempted to call report. Nurse not available. Receiving nurse to return call.

## 2012-12-13 NOTE — ED Notes (Signed)
Pt transported via EMS from home c/o dizziness onset yesterday at cookout. Pt states she had to hold on to walls, pt states she began hyperventilating, felt numb allover. A & D, PWD, no seizure activity noted

## 2012-12-14 DIAGNOSIS — D72819 Decreased white blood cell count, unspecified: Secondary | ICD-10-CM

## 2012-12-14 DIAGNOSIS — Z5189 Encounter for other specified aftercare: Secondary | ICD-10-CM

## 2012-12-14 LAB — PHENYTOIN LEVEL, TOTAL: Phenytoin Lvl: 29.9 ug/mL — ABNORMAL HIGH (ref 10.0–20.0)

## 2012-12-14 MED ORDER — ALBUTEROL SULFATE HFA 108 (90 BASE) MCG/ACT IN AERS
2.0000 | INHALATION_SPRAY | Freq: Four times a day (QID) | RESPIRATORY_TRACT | Status: DC | PRN
  Filled 2012-12-14: qty 6.7

## 2012-12-14 NOTE — Progress Notes (Signed)
[facility] PROGRESS NOTE  [name] FAO:[id number] DOB: [birth year] DOA: 12/13/2012 PCP: Pearson Grippe, MD  Primary Neirologist: Dr. Joycelyn Schmid  Brief narrative 57 year old female patient with history of seizure disorder following intracranial bleed requiring surgery 24 years ago, prior history of Dilantin toxicity, has been on several different AEDs in past, was seen at [facility] in January and prescribed a new medication (Onfi) but was unable to fill and hence resumed Dilantin. She was admitted on 12/13/12 due to difficulty walking and was found to be Dilantin toxic.  Assessment/Plan: 1. Dilantin toxicity: Apparently has not tolerated multiple AEDs in the past. Dilantin held. Dilantin levels decreasing and we'll reinitiate when in therapeutic range. OP followup with local neurologist and [facility]. 2. Seizure disorder: Management as per problem #1. Last seizure approximately a month ago per patient. 3. Chronic thrombocytopenia: Stable. 4. Leukopenia:? Etiology-? Dilantin toxicity. Follow daily CBCs.  Code Status: Full Family Communication: Discussed with patient Disposition Plan: Home when medically stable.   Consultants:  Neurology  Procedures:  None  Antibiotics:  None   HPI/Subjective: Feels better. Less tired but still feels sleepy.  Objective: Filed Vitals:   12/13/12 1411 12/13/12 1602 12/13/12 2045 12/14/12 0536  BP: 101/60 94/45 113/47 109/48  Pulse: 70 77 73 65  Temp: 98 F (36.7 C) 98.6 F (37 C) 98.1 F (36.7 C) 97.9 F (36.6 C)  TempSrc: Oral Oral Oral Oral  Resp: 18 18 16 20   Height:  5\' 7"  (1.702 m)    Weight:  53.524 kg (118 lb)    SpO2: 95% 94% 93% 97%    Intake/Output Summary (Last 24 hours) at 12/14/12 0733 Last data filed at 12/14/12 0604  Gross per 24 hour  Intake 2861.67 ml  Output      0 ml  Net 2861.67 ml   Filed Weights   12/13/12 1602  Weight: 53.524 kg (118 lb)    Exam:   General  exam: Comfortable.  Respiratory system: Clear. No increased work of breathing.  Cardiovascular system: S1 & S2 heard, RRR. No JVD, murmurs, gallops, clicks or pedal edema. telemetry: Sinus rhythm.  Gastrointestinal system: Abdomen is nondistended, soft and nontender. Normal bowel sounds heard.  Central nervous system: Alert and oriented. No focal neurological deficits.  Extremities: Symmetric 5 x 5 power.   Data Reviewed: Basic Metabolic Panel:  Recent Labs Lab 12/13/12 0500  NA 140  K 3.7  CL 104  CO2 29  GLUCOSE 96  BUN 10  CREATININE 0.56  CALCIUM 8.5   Liver Function Tests:  Recent Labs Lab 12/13/12 0500  AST 17  ALT 9  ALKPHOS 66  BILITOT 0.1*  PROT 5.9*  ALBUMIN 3.4*   No results found for this basename: LIPASE, AMYLASE,  in the last 168 hours No results found for this basename: AMMONIA,  in the last 168 hours CBC:  Recent Labs Lab 12/13/12 0500  WBC 3.1*  NEUTROABS 1.3*  HGB 12.4  HCT 37.8  MCV 101.1*  PLT 130*   Cardiac Enzymes: No results found for this basename: CKTOTAL, CKMB, CKMBINDEX, TROPONINI,  in the last 168 hours BNP (last 3 results) No results found for this basename: PROBNP,  in the last 8760 hours CBG: No results found for this basename: GLUCAP,  in the last 168 hours  No results found for this or any previous visit (from the past 240 hour(s)).   Studies: Ct Head Wo Contrast  12/13/2012  *RADIOLOGY REPORT*  Clinical Data: Dizziness,  seizure disorder, remote intracranial hemorrhage.  CT HEAD WITHOUT CONTRAST  Technique:  Contiguous axial images were obtained from the base of the skull through the vertex without contrast.  Comparison: 09/09/2012  Findings: Previous right occipital craniotomy with extensive parenchymal loss in the right occipital lobe as before.  Up to 7 mm depression of the bone flap as before. Stable ex vacuo dilatation of the occipital horn right lateral ventricle.  The Atherosclerotic and physiologic intracranial  calcifications. There is no evidence of acute intracranial hemorrhage, brain edema, mass lesion, acute infarction,   mass effect, or midline shift. Acute infarct may be inapparent on noncontrast CT.  No other intra-axial abnormalities are seen, and the ventricles and sulci are within normal limits in size and symmetry.   No abnormal extra-axial fluid collections or masses are identified.  No significant calvarial abnormality.  IMPRESSION: 1. Negative for bleed or other acute intracranial process. 2.  Stable postoperative changes.   Original Report Authenticated By: D. Andria Rhein, MD      Additional labs:   Scheduled Meds: . heparin subcutaneous  5,000 Units Subcutaneous Q8H  . sodium chloride  3 mL Intravenous Q12H   Continuous Infusions: . sodium chloride 125 mL/hr at 12/14/12 0604    Active Problems:   Dilantin toxicity   Seizure disorder   History of Intracranial bleed   Thrombocytopenia- chronic    Time spent: 30 minutes    [facility]  [facility] Pager [phone].   If 8PM-8AM, please contact night-coverage at [email or web address], password [facility] 12/14/2012, 7:33 AM  LOS: 1 day

## 2012-12-14 NOTE — Progress Notes (Signed)
  Pharmacy Note - Clarification of Unknown Outpatient Seizure Medication  Asked by Dr. Waymon Amato to investigate the unknown seizure medication mentioned by the patient. Per [facility] on [facility], the only seizure medication the patient had on file that hadn't been filled yet was for the benzodiazepine Onfi (clobazam) 10mg  tab, sig: take 2 tablets at bed time, prescribed by Dennard Nip, a neurologist at [facility]. Per the patient's daughter Santiago Bumpers, [phone]), she was unable to pick up the medication because it needed a Prior Authorization from the physician. I called [facility] and as of about an hour ago, the pharmacy technician was able to process the prescription, so it appears the Prior Authorization has now been resolved. In addition to the seizure meds listed in 12/13/12 Neurologist's note, patient also mentioned having been on gabapentin and zonisamide in the past (pt thinks it was zonisamide she had a bad reaction to). Pt also mentioned that Vimpat made her head "bobble like a bobble head" (physically bounce up and down).   Answer to Dr. Richardean Chimera question:  - The unknown seizure medication is: Onfi (clobazam) 10mg  tab, sig: take 2 tablets at bed time, prescribed by Dennard Nip.  Darrol Angel, PharmD Pager: [phone] 12/14/2012 2:24 PM

## 2012-12-15 LAB — PHENYTOIN LEVEL, TOTAL: Phenytoin Lvl: 23.9 ug/mL — ABNORMAL HIGH (ref 10.0–20.0)

## 2012-12-15 LAB — ALBUMIN: Albumin: 2.7 g/dL — ABNORMAL LOW (ref 3.5–5.2)

## 2012-12-15 NOTE — Progress Notes (Signed)
Tele notification of VT, patient up in bathroom washing up, tolerating well, no shortness of breath or chest pain, pulse = 92 regular, assisted back to bed.  Family member visiting along with speaking to the dietician

## 2012-12-15 NOTE — Progress Notes (Signed)
[facility] PROGRESS NOTE  [name] ZOX:[id number] DOB: 23-Jan-1956 DOA: 12/13/2012 PCP: Pearson Grippe, MD  Primary Neirologist: Dr. Joycelyn Schmid  Brief narrative 57 year old female patient with history of seizure disorder following intracranial bleed requiring surgery 24 years ago, prior history of Dilantin toxicity, has been on several different AEDs in past, was seen at [facility] in January and prescribed a new medication (Onfi) but was unable to fill and hence resumed Dilantin. She was admitted on 12/13/12 due to difficulty walking and was found to be Dilantin toxic.  Assessment/Plan: 1. Dilantin toxicity: Apparently has not tolerated multiple AEDs in the past. Dilantin held. Dilantin levels decreasing and we'll reinitiate when in therapeutic range. OP followup with local neurologist and [facility]. Corrected Dilantin dose on 5/14:37.3. Symptomatically better but still unsteady. This may be secondary to poor oral intake, and there by a reduced albumin. Nutrition consulted. 2. Seizure disorder: Management as per problem #1. Last seizure approximately a month ago per patient. 3. Chronic thrombocytopenia: Stable. 4. Leukopenia:? Etiology-? Dilantin toxicity. CBC is not ordered today-will check in a.m.  Code Status: Full Family Communication: Discussed with patient Disposition Plan: Home when medically stable-? 5/16.   Consultants:  Neurology  Procedures:  None  Antibiotics:  None   HPI/Subjective: Feels better. Not drowsy. Still unsteady on feet.  Objective: Filed Vitals:   12/14/12 1523 12/14/12 2122 12/15/12 0613 12/15/12 1330  BP: 104/48 105/46 122/67 106/41  Pulse: 78 71 72 66  Temp: 98.3 F (36.8 C) 97.7 F (36.5 C) 97.8 F (36.6 C) 98.2 F (36.8 C)  TempSrc: Oral Oral Oral Oral  Resp: 20 18 17 18   Height:      Weight:      SpO2: 98% 96% 98% 99%    Intake/Output Summary (Last 24 hours) at 12/15/12 1436 Last data filed at  12/15/12 1332  Gross per 24 hour  Intake 2736.67 ml  Output   5800 ml  Net -3063.33 ml   Filed Weights   12/13/12 1602  Weight: 53.524 kg (118 lb)    Exam:   General exam: Comfortable.  Respiratory system: Clear. No increased work of breathing.  Cardiovascular system: S1 & S2 heard, RRR. No JVD, murmurs, gallops, clicks or pedal edema. telemetry: Sinus rhythm.  Gastrointestinal system: Abdomen is nondistended, soft and nontender. Normal bowel sounds heard.  Central nervous system: Alert and oriented. No focal neurological deficits.  Extremities: Symmetric 5 x 5 power.   Data Reviewed: Basic Metabolic Panel:  Recent Labs Lab 12/13/12 0500  NA 140  K 3.7  CL 104  CO2 29  GLUCOSE 96  BUN 10  CREATININE 0.56  CALCIUM 8.5   Liver Function Tests:  Recent Labs Lab 12/13/12 0500 12/15/12 0420  AST 17  --   ALT 9  --   ALKPHOS 66  --   BILITOT 0.1*  --   PROT 5.9*  --   ALBUMIN 3.4* 2.7*   No results found for this basename: LIPASE, AMYLASE,  in the last 168 hours No results found for this basename: AMMONIA,  in the last 168 hours CBC:  Recent Labs Lab 12/13/12 0500  WBC 3.1*  NEUTROABS 1.3*  HGB 12.4  HCT 37.8  MCV 101.1*  PLT 130*   Cardiac Enzymes: No results found for this basename: CKTOTAL, CKMB, CKMBINDEX, TROPONINI,  in the last 168 hours BNP (last 3 results) No results found for this basename: PROBNP,  in the last 8760 hours CBG: No results found  for this basename: GLUCAP,  in the last 168 hours  No results found for this or any previous visit (from the past 240 hour(s)).   Studies: No results found.   Additional labs:   Scheduled Meds: . heparin subcutaneous  5,000 Units Subcutaneous Q8H  . sodium chloride  3 mL Intravenous Q12H   Continuous Infusions:    Active Problems:   Dilantin toxicity   Seizure disorder   History of Intracranial bleed   Thrombocytopenia- chronic   Leukopenia    Time spent: 30  minutes    [facility]  [facility] Pager [phone].   If 8PM-8AM, please contact night-coverage at [email or web address], password [facility] 12/15/2012, 2:36 PM  LOS: 2 days

## 2012-12-15 NOTE — Progress Notes (Signed)
NEURO HOSPITALIST PROGRESS NOTE   SUBJECTIVE:                                                                                                                        No further episodes.  She is very upset that her levels were so high and cannot understand how this might have happened.  I have spent a long time discussing possibility of low albumin and that this can occur. I have explained we will continue to hold the medication and she will be laced on a lower dose 300 mg HS and need follow up with her primary neurologist Dr. Marjory Lies.   OBJECTIVE:                                                                                                                           Vital signs in last 24 hours: Temp:  [97.7 F (36.5 C)-98.3 F (36.8 C)] 97.8 F (36.6 C) (05/14 1914) Pulse Rate:  [71-78] 72 (05/14 0613) Resp:  [17-20] 17 (05/14 7829) BP: (104-122)/(46-67) 122/67 mmHg (05/14 0613) SpO2:  [96 %-98 %] 98 % (05/14 5621)  Intake/Output from previous day: 05/13 0701 - 05/14 0700 In: 2816.7 [P.O.:1200; I.V.:1616.7] Out: 4800 [Urine:4800] Intake/Output this shift:   Nutritional status: General  Past Medical History  Diagnosis Date  . Seizures   . DVT (deep venous thrombosis)   . Asthma   . Cerebral hemorrhage      Neurologic Exam:  Mental Status:  Alert, oriented, thought content appropriate. Speech fluent without evidence of aphasia. Able to follow 3 step commands without difficulty.  Cranial Nerves:  II: Discs flat bilaterally; Visual fields grossly normal, pupils equal, round, reactive to light and accommodation  III,IV, VI: ptosis not present, extra-ocular motions intact bilaterally with nystagmus bilaterally  V,VII: smile symmetric, facial light touch sensation normal bilaterally  VIII: hearing normal bilaterally  IX,X: gag reflex present  XI: bilateral shoulder shrug  XII: midline tongue extension  Motor:  Right :  Upper extremity  5/5    Left:  Upper extremity 5/5   Lower extremity 5/5     Lower extremity 5/5  Tone and bulk:normal tone throughout; no atrophy noted  Sensory: Pinprick and light touch intact throughout, bilaterally  Deep Tendon Reflexes: 2+ and symmetric throughout  Plantars:  Right: upgoing Left: upgoing  Cerebellar:  Finger to nose intact --positive action tremor (old). Heel to shin intact.  Gait: slightly unstable  CV: pulses palpable throughout    Lab Results: No results found for this basename: cbc, bmp, coags, chol, tri, ldl, hga1c   Lipid Panel No results found for this basename: CHOL, TRIG, HDL, CHOLHDL, VLDL, LDLCALC,  in the last 72 hours  Studies/Results: No results found.  MEDICATIONS                                                                                                                        Scheduled: . heparin subcutaneous  5,000 Units Subcutaneous Q8H  . sodium chloride  3 mL Intravenous Q12H    ASSESSMENT/PLAN:                                                                                                            57 year old female with history of seizures on Dilantin. Dilantin level supratherapeutic. Was in the process of coming off Dilantin due to incomplete seizure control. Unclear what medication was to replace Dilantin. She states she has been on Keppra in past "which caused her to have seizures" and does not want to take either Keppra or Vimpat. She was on 200 mg in AM and PM prior to arrival.  We will continue to hold dilantin as her dilantin as her corrected dose is 37.3.   Will order Dilantin and albumin for tomorrow.  Once she is therapeutic she will need to be placed back on Dilantin 300 mg QHS and have follow up with [facility] [id number] [place], [place] [address]--[phone] (Dr. Marjory Lies) in 1-2 weeks.    Will continue to follow.    Assessment and plan discussed with with attending physician and they are in agreement.    Felicie Morn PA-C [facility] [phone]  12/15/2012, 8:45 AM

## 2012-12-15 NOTE — Progress Notes (Signed)
Nutrition Brief Note  Patient assessed per MD request.   Body mass index is 18.48 kg/(m^2). Patient meets criteria for Underweight based on current BMI.   Current diet order is Regular, patient is consuming approximately 100% of meals at this time. Pt reports that her appetite is good and states she was eating well PTA. Pt's niece in room confirms that pt eats well.   Wt Readings from Last 3 Encounters:  12/13/12 118 lb (53.524 kg)  06/28/12 128 lb 1.4 oz (58.1 kg)  03/01/12 128 lb 1.4 oz (58.1 kg)   Pt reports that she usually weighs 128 lbs but, she started a new medication a few months ago and the weight just came off. Pt also reports occasional nausea from medications. Pt reports that she drinks a lot of coffee and recently started working outside more; pt doesn't like water, ?dehydration. Encouraged pt to stay well hydrated with flavored beverages and encouraged adequate po intake with 3 meals daily and incorporation of protein-rich foods. Discussed protein food sources. Pt voiced understanding.  Labs and medications reviewed. Dilantin may cause folate deficiency; may want to consider checking pt's folate level. Last folate level in chart from 04/16/2007 was normal.  No further nutrition interventions warranted at this time. If nutrition issues arise, please consult RD.   Ian Malkin RD, LDN Inpatient Clinical Dietitian Pager: [phone] After Hours Pager: [phone]

## 2012-12-15 NOTE — Plan of Care (Signed)
Problem: Phase III Progression Outcomes Goal: Pain controlled on oral analgesia Outcome: Not Applicable Date Met:  12/15/12 Patient has no complaints of pain since admission

## 2012-12-16 LAB — CBC
HCT: 37.1 % (ref 36.0–46.0)
Platelets: 102 10*3/uL — ABNORMAL LOW (ref 150–400)
RBC: 3.66 MIL/uL — ABNORMAL LOW (ref 3.87–5.11)
RDW: 13.1 % (ref 11.5–15.5)
WBC: 3.2 10*3/uL — ABNORMAL LOW (ref 4.0–10.5)

## 2012-12-16 LAB — PHENYTOIN LEVEL, TOTAL
Phenytoin Lvl: 16.5 ug/mL (ref 10.0–20.0)
Phenytoin Lvl: 15.6 ug/mL (ref 10.0–20.0)

## 2012-12-16 MED ORDER — PHENYTOIN SODIUM EXTENDED 100 MG PO CAPS
300.0000 mg | ORAL_CAPSULE | Freq: Every day | ORAL | Status: DC
  Administered 2012-12-16 – 2012-12-18 (×3): 300 mg via ORAL
  Filled 2012-12-16 (×4): qty 3

## 2012-12-16 MED ORDER — SODIUM CHLORIDE 0.9 % IV SOLN
100.0000 mg | Freq: Two times a day (BID) | INTRAVENOUS | Status: DC
  Administered 2012-12-16 – 2012-12-18 (×4): 100 mg via INTRAVENOUS
  Filled 2012-12-16 (×6): qty 10

## 2012-12-16 MED ORDER — SODIUM CHLORIDE 0.9 % IV SOLN
100.0000 mg | INTRAVENOUS | Status: AC
  Administered 2012-12-16: 100 mg via INTRAVENOUS
  Filled 2012-12-16: qty 10

## 2012-12-16 MED ORDER — LORAZEPAM 2 MG/ML IJ SOLN
1.0000 mg | Freq: Four times a day (QID) | INTRAMUSCULAR | Status: DC | PRN
  Administered 2012-12-16 – 2012-12-17 (×2): 1 mg via INTRAVENOUS
  Filled 2012-12-16 (×2): qty 1

## 2012-12-16 NOTE — Progress Notes (Signed)
PT Cancellation Note  Patient Details Name: [name] MRN: [id number] DOB: September 16, 1955   Cancelled Treatment:    Reason Eval/Treat Not Completed: Medical issues which prohibited therapy  RN reports pt had seizure about 2 hrs ago.  Pt slow to respond to questions at bedside and stated no to mobility at this time.  Will attempt to check back tomorrow.   Vanden Fawaz,KATHrine E 12/16/2012, 2:35 PM Pager: [phone]

## 2012-12-16 NOTE — Progress Notes (Signed)
Called to room to assist primary nurse as patient having active gran mal seizure, lasting aprox one minute, Ativan 1mg  adm by charge nurse per order. Post event pt responds to sternal rub, PERRLA. Dr. Waymon Amato paged by primary nurse. He states he will call neuro and may transfer to stepdown. See vital, stable at current time.

## 2012-12-16 NOTE — Progress Notes (Signed)
Dr. Waymon Amato spoke with neuro, states neuro will order labs and restart dilantin.  Dr Waymon Amato also states if pt has more seizures and isn't arousable and is "out of it" after seizure then we will transfer. Pt stable at this time

## 2012-12-16 NOTE — Progress Notes (Addendum)
[facility] PROGRESS NOTE  [name] RUE:[id number] DOB: August 13, 1955 DOA: 12/13/2012 PCP: Pearson Grippe, MD  Primary Neirologist: Dr. Joycelyn Schmid  Brief narrative 57 year old female patient with history of seizure disorder following intracranial bleed requiring surgery 24 years ago, prior history of Dilantin toxicity, has been on several different AEDs in past, was seen at [facility] in January and prescribed a new medication (Onfi) but was unable to fill and hence resumed Dilantin. She was admitted on 12/13/12 due to difficulty walking and was found to be Dilantin toxic.  Assessment/Plan: 1. Dilantin toxicity: Apparently has not tolerated multiple AEDs in the past. Dilantin held. Dilantin levels decreasing and we'll reinitiate when in therapeutic range-possibly on 5/16. OP followup with local neurologist and [facility]. Corrected Dilantin dose on 5/15: 23.6. Symptomatically better. This may be secondary to poor oral intake, and there by a reduced albumin. Nutrition consulted. Neurology followup appreciated. PT to evaluate. 2. Seizure disorder: Management as per problem #1. Last seizure approximately a month ago per patient. 3. Chronic thrombocytopenia: Stable. OP followup 4. Leukopenia:? Etiology-? Dilantin toxicity. Stable. OP followup  Addendum: Nurses reported that patient had a seizure (approximately 12:30 PM on 5/15) while patient was talking to her sister on the phone-duration not exactly known-abated spontaneously. Ordered Ativan when necessary for seizures. Discussed with neuro hospitalist team who will evaluate and probably start on some AED.  Code Status: Full Family Communication: Discussed with patient Disposition Plan: Home when medically stable-? 5/16.   Consultants:  Neurology  Procedures:  None  Antibiotics:  None   HPI/Subjective: Continues to feel better and is eager to go home.  Objective: Filed Vitals:   12/15/12 0613  12/15/12 1330 12/15/12 2146 12/16/12 0605  BP: 122/67 106/41 110/50 113/55  Pulse: 72 66 60 51  Temp: 97.8 F (36.6 C) 98.2 F (36.8 C) 97.7 F (36.5 C) 98.1 F (36.7 C)  TempSrc: Oral Oral Oral Oral  Resp: 17 18 18 18   Height:      Weight:      SpO2: 98% 99% 97% 97%    Intake/Output Summary (Last 24 hours) at 12/16/12 1157 Last data filed at 12/16/12 0700  Gross per 24 hour  Intake   1160 ml  Output   4200 ml  Net  -3040 ml   Filed Weights   12/13/12 1602  Weight: 53.524 kg (118 lb)    Exam:   General exam: Comfortable.  Respiratory system: Clear. No increased work of breathing.  Cardiovascular system: S1 & S2 heard, RRR. No JVD, murmurs, gallops, clicks or pedal edema. telemetry: SB in 50's-SR.  Gastrointestinal system: Abdomen is nondistended, soft and nontender. Normal bowel sounds heard.  Central nervous system: Alert and oriented. No focal neurological deficits.  Extremities: Symmetric 5 x 5 power.   Data Reviewed: Basic Metabolic Panel:  Recent Labs Lab 12/13/12 0500  NA 140  K 3.7  CL 104  CO2 29  GLUCOSE 96  BUN 10  CREATININE 0.56  CALCIUM 8.5   Liver Function Tests:  Recent Labs Lab 12/13/12 0500 12/15/12 0420 12/16/12 0425  AST 17  --   --   ALT 9  --   --   ALKPHOS 66  --   --   BILITOT 0.1*  --   --   PROT 5.9*  --   --   ALBUMIN 3.4* 2.7* 3.0*   No results found for this basename: LIPASE, AMYLASE,  in the last 168 hours No results found for  this basename: AMMONIA,  in the last 168 hours CBC:  Recent Labs Lab 12/13/12 0500 12/16/12 0425  WBC 3.1* 3.2*  NEUTROABS 1.3*  --   HGB 12.4 12.0  HCT 37.8 37.1  MCV 101.1* 101.4*  PLT 130* 102*   Cardiac Enzymes: No results found for this basename: CKTOTAL, CKMB, CKMBINDEX, TROPONINI,  in the last 168 hours BNP (last 3 results) No results found for this basename: PROBNP,  in the last 8760 hours CBG: No results found for this basename: GLUCAP,  in the last 168 hours  No  results found for this or any previous visit (from the past 240 hour(s)).   Studies: No results found.   Additional labs:   Scheduled Meds: . heparin subcutaneous  5,000 Units Subcutaneous Q8H  . sodium chloride  3 mL Intravenous Q12H   Continuous Infusions:    Active Problems:   Dilantin toxicity   Seizure disorder   History of Intracranial bleed   Thrombocytopenia- chronic   Leukopenia    Time spent: 30 minutes    [facility]  [facility] Pager [phone].   If 8PM-8AM, please contact night-coverage at [email or web address], password [facility] 12/16/2012, 11:57 AM  LOS: 3 days

## 2012-12-16 NOTE — Progress Notes (Addendum)
NEURO HOSPITALIST PROGRESS NOTE   SUBJECTIVE:                                                                                                                         Feeling less anxiety today but still very upset about how her dilantin level was so high. No other complaints.  OBJECTIVE:                                                                                                                           Vital signs in last 24 hours: Temp:  [97.7 F (36.5 C)-98.2 F (36.8 C)] 98.1 F (36.7 C) (05/15 0605) Pulse Rate:  [51-66] 51 (05/15 0605) Resp:  [18] 18 (05/15 0605) BP: (106-113)/(41-55) 113/55 mmHg (05/15 0605) SpO2:  [97 %-99 %] 97 % (05/15 0605)  Intake/Output from previous day: 05/14 0701 - 05/15 0700 In: 1240 [P.O.:1240] Out: 5200 [Urine:5200] Intake/Output this shift:   Nutritional status: General  Past Medical History  Diagnosis Date  . Seizures   . DVT (deep venous thrombosis)   . Asthma   . Cerebral hemorrhage      Neurologic Exam:  Mental Status:  Alert, oriented, thought content appropriate. Speech fluent without evidence of aphasia. Able to follow 3 step commands without difficulty.  Cranial Nerves:  II: Discs flat bilaterally; Visual fields grossly normal, pupils equal, round, reactive to light and accommodation  III,IV, VI: ptosis not present, extra-ocular motions intact bilaterally with nystagmus bilaterally  V,VII: smile symmetric, facial light touch sensation normal bilaterally  VIII: hearing normal bilaterally  IX,X: gag reflex present  XI: bilateral shoulder shrug  XII: midline tongue extension  Motor:  Right :  Upper extremity 5/5   Left:  Upper extremity 5/5   Lower extremity 5/5    Lower extremity 5/5  Tone and bulk:normal tone throughout; no atrophy noted  Sensory: Pinprick and light touch intact throughout, bilaterally  Deep Tendon Reflexes: 2+ and symmetric throughout  Plantars:  Right: upgoing  Left: upgoing  Cerebellar:  Finger to nose intact --positive action tremor (old).  Heel to shin intact.  Gait: slightly unstable  CV: pulses palpable throughout    Lab Results: No results found for this basename: cbc, bmp, coags, chol,  tri, ldl, hga1c   Lipid Panel No results found for this basename: CHOL, TRIG, HDL, CHOLHDL, VLDL, LDLCALC,  in the last 72 hours  Studies/Results: No results found.  MEDICATIONS                                                                                                                        Scheduled: . heparin subcutaneous  5,000 Units Subcutaneous Q8H  . sodium chloride  3 mL Intravenous Q12H    ASSESSMENT/PLAN:                                                                                                             57 year old female with history of seizures on Dilantin. Dilantin level supratherapeutic. She states she has been on Keppra in past "which caused her to have seizures" and does not want to take either Keppra or Vimpat. She was on 200 mg in AM and PM prior to arrival. We will continue to hold dilantin as her dilantin as her corrected dose is 23.6.   Will order Dilantin and albumin for tomorrow. Will need PT to evaluate her gait prior to discharge.   Once she is therapeutic she will need to be placed back on Dilantin 300 mg QHS and have follow up with [facility] [address] [place], [place] [address]--[phone] (Dr. Marjory Lies) [phone] in 1-2 weeks.  Will continue to follow.      Assessment and plan discussed with with attending physician and they are in agreement.    Felicie Morn PA-C [facility] [phone]  12/16/2012, 8:21 AM

## 2012-12-16 NOTE — Progress Notes (Signed)
Walking down hall and noted patient having a seizure; pt was on phone with sister when seizure begun- stated it started at about 12:15pm.  When noted pt was coming out of seizure.  Post-ictal pt was alert/oreinted x2 to person and place, could follow finger movement.  Incontinence episode with seizure.  Pt up to bedside commode with unsteady gait and couldn't follow commands.  VS 99.4, 108/59, 70, 92% on RA.  MD notified- new orders place for ativan 1mg  IV Q6 for seizures and MD stated he will contact neuro.  Will continue to monitor

## 2012-12-17 LAB — CBC
Platelets: 112 10*3/uL — ABNORMAL LOW (ref 150–400)
RBC: 3.95 MIL/uL (ref 3.87–5.11)
RDW: 12.9 % (ref 11.5–15.5)
WBC: 7.6 10*3/uL (ref 4.0–10.5)

## 2012-12-17 LAB — ALBUMIN: Albumin: 3.5 g/dL (ref 3.5–5.2)

## 2012-12-17 MED ORDER — PREGABALIN 50 MG PO CAPS
100.0000 mg | ORAL_CAPSULE | Freq: Two times a day (BID) | ORAL | Status: DC
  Administered 2012-12-17 – 2012-12-18 (×4): 100 mg via ORAL
  Filled 2012-12-17 (×5): qty 2

## 2012-12-17 MED ORDER — ALBUTEROL SULFATE (5 MG/ML) 0.5% IN NEBU
5.0000 mg | INHALATION_SOLUTION | Freq: Once | RESPIRATORY_TRACT | Status: AC
  Administered 2012-12-17: 5 mg via RESPIRATORY_TRACT
  Filled 2012-12-17: qty 1

## 2012-12-17 MED ORDER — ACETAMINOPHEN 325 MG PO TABS
650.0000 mg | ORAL_TABLET | ORAL | Status: DC | PRN
  Administered 2012-12-17: 650 mg via ORAL
  Filled 2012-12-17: qty 2

## 2012-12-17 NOTE — Evaluation (Signed)
Physical Therapy Evaluation Patient Details Name: [name] MRN: [id number] DOB: 05/15/1956 Today's Date: 12/17/2012 Time: 4098-1191 PT Time Calculation (min): 21 min  PT Assessment / Plan / Recommendation Clinical Impression  57 yo female admitted with seizures, difficulty walking, dilantin toxicity. Pt has had several seizures since admission. On eval, pt required Min assist for mobility-able to ambulate ~60 feet with 1 hand support of IV pole. Recommend HHPT with 24 hour supervision, depending on progress.     PT Assessment  Patient needs continued PT services    Follow Up Recommendations  Home health PT;Supervision/Assistance - 24 hour    Does the patient have the potential to tolerate intense rehabilitation      Barriers to Discharge        Equipment Recommendations   (to be determined)    Recommendations for Other Services OT consult   Frequency Min 3X/week    Precautions / Restrictions Precautions Precautions: Fall;Other (comment) Precaution Comments: seizure Restrictions Weight Bearing Restrictions: No   Pertinent Vitals/Pain Pt denies pain      Mobility  Bed Mobility Bed Mobility: Supine to Sit;Sit to Supine Supine to Sit: 4: Min guard Sit to Supine: 4: Min guard Transfers Transfers: Sit to Stand;Stand to Sit Sit to Stand: 4: Min assist;From bed Stand to Sit: 4: Min guard;To bed Details for Transfer Assistance: Assist to rise, stabilize Ambulation/Gait Ambulation/Gait Assistance: 4: Min assist Ambulation Distance (Feet): 60 Feet Assistive device: None Ambulation/Gait Assistance Details: Pt pushed IV pole for stability. Assist to stabilize throughout ambulation needed as well. slow gait speed. Pt declined further ambulation due to feeling weak.  Gait Pattern: Step-through pattern;Decreased stride length    Exercises     PT Diagnosis: Difficulty walking;Abnormality of gait;Generalized weakness  PT Problem List: Decreased strength;Decreased  activity tolerance;Decreased mobility;Decreased balance PT Treatment Interventions: DME instruction;Gait training;Functional mobility training;Therapeutic activities;Therapeutic exercise;Balance training;Patient/family education   PT Goals Acute Rehab PT Goals PT Goal Formulation: With patient Time For Goal Achievement: 12/24/12 Potential to Achieve Goals: Good Pt will go Supine/Side to Sit: with modified independence PT Goal: Supine/Side to Sit - Progress: Goal set today Pt will go Sit to Supine/Side: with modified independence PT Goal: Sit to Supine/Side - Progress: Goal set today Pt will go Sit to Stand: with modified independence PT Goal: Sit to Stand - Progress: Goal set today Pt will Ambulate: >150 feet;with modified independence;with least restrictive assistive device PT Goal: Ambulate - Progress: Goal set today  Visit Information  Last PT Received On: 12/17/12 Assistance Needed: +1    Subjective Data  Subjective: i feel so weak/tired Patient Stated Goal: home. regain independence   Prior Functioning  Home Living Lives With: Family Type of Home: House Home Access: Level entry Home Layout: One level Home Adaptive Equipment: None Prior Function Level of Independence: Independent Able to Take Stairs?: Yes Communication Communication: No difficulties    Cognition  Cognition Arousal/Alertness: Awake/alert Behavior During Therapy: WFL for tasks assessed/performed Overall Cognitive Status: Within Functional Limits for tasks assessed    Extremity/Trunk Assessment Right Lower Extremity Assessment RLE ROM/Strength/Tone: Deficits RLE ROM/Strength/Tone Deficits: Strength at least 4/5 with functional mobillity Left Lower Extremity Assessment LLE ROM/Strength/Tone: Deficits LLE ROM/Strength/Tone Deficits: Strength at least 4/5 with functional mobillity Trunk Assessment Trunk Assessment: Normal   Balance    End of Session PT - End of Session Equipment Utilized During  Treatment: Gait belt Activity Tolerance: Patient limited by fatigue Patient left: in bed;with call bell/phone within reach;with bed alarm set  GP  Weston Anna, MPT Pager: [phone]

## 2012-12-17 NOTE — Progress Notes (Signed)
[facility] PROGRESS NOTE  [name] ZOX:[id number] DOB: 1956-03-12 DOA: 12/13/2012 PCP: Pearson Grippe, MD  Primary Neirologist: Dr. Joycelyn Schmid  Brief narrative 57 year old female patient with history of seizure disorder following intracranial bleed requiring surgery 24 years ago, prior history of Dilantin toxicity, has been on several different AEDs in past, was seen at [facility] in January and prescribed a new medication (Onfi) but was unable to fill and hence resumed Dilantin. She was admitted on 12/13/12 due to difficulty walking and was found to be Dilantin toxic.  Assessment/Plan: 1. Dilantin toxicity: Apparently has not tolerated multiple AEDs in the past. Dilantin held. OP followup with local neurologist and [facility]. Corrected Dilantin dose on 5/15: 23.6. Symptomatically better. This may be secondary to poor oral intake, and there by a reduced albumin. Nutrition consulted. Neurology followup appreciated. PT to evaluate-home health PT. Dilantin had been held from admission and levels were monitored closely and Dilantin was resumed on 5/15. Corrected serum Dilantin 20.5. 2. Seizure disorder: Patient did not have seizures since admission until morning of 5/15. Since then, she had 3 seizures-1 prior to Vimpat, 1 while getting Vimpat and the third one hours later/early this morning. Neurology following-continue Dilantin, Vimpat and Lyrica added today. 3. Chronic thrombocytopenia: Stable. OP follow up 4. Leukopenia:? Etiology-? Dilantin toxicity. Resolved.  Code Status: Full Family Communication: Discussed with patient Disposition Plan: Home when medically stable.   Consultants:  Neurology  Procedures:  None  Antibiotics:  None   HPI/Subjective: Feels tired. Had a witnessed tonic-clonic seizure lasting approximately a minute at midnight last night.  Objective: Filed Vitals:   12/16/12 2200 12/17/12 0425 12/17/12 0600 12/17/12 1358  BP:  119/53 117/55 105/55 99/53  Pulse: 94 97 87 85  Temp: 98.9 F (37.2 C) 100.1 F (37.8 C) 98.9 F (37.2 C) 98.3 F (36.8 C)  TempSrc: Oral Oral Oral Oral  Resp:   18 18  Height:      Weight:      SpO2: 96% 97% 100% 98%    Intake/Output Summary (Last 24 hours) at 12/17/12 1729 Last data filed at 12/17/12 0759  Gross per 24 hour  Intake      0 ml  Output    800 ml  Net   -800 ml   Filed Weights   12/13/12 1602  Weight: 53.524 kg (118 lb)    Exam:   General exam: Comfortable.  Respiratory system: Clear. No increased work of breathing.  Cardiovascular system: S1 & S2 heard, RRR. No JVD, murmurs, gallops, clicks or pedal edema.   Gastrointestinal system: Abdomen is nondistended, soft and nontender. Normal bowel sounds heard.  Central nervous system: Alert and oriented. No focal neurological deficits.  Extremities: Symmetric 5 x 5 power.   Data Reviewed: Basic Metabolic Panel:  Recent Labs Lab 12/13/12 0500  NA 140  K 3.7  CL 104  CO2 29  GLUCOSE 96  BUN 10  CREATININE 0.56  CALCIUM 8.5   Liver Function Tests:  Recent Labs Lab 12/13/12 0500 12/15/12 0420 12/16/12 0425 12/17/12 0420  AST 17  --   --   --   ALT 9  --   --   --   ALKPHOS 66  --   --   --   BILITOT 0.1*  --   --   --   PROT 5.9*  --   --   --   ALBUMIN 3.4* 2.7* 3.0* 3.5   No results found for this  basename: LIPASE, AMYLASE,  in the last 168 hours No results found for this basename: AMMONIA,  in the last 168 hours CBC:  Recent Labs Lab 12/13/12 0500 12/16/12 0425 12/17/12 0420  WBC 3.1* 3.2* 7.6  NEUTROABS 1.3*  --   --   HGB 12.4 12.0 13.3  HCT 37.8 37.1 39.7  MCV 101.1* 101.4* 100.5*  PLT 130* 102* 112*   Cardiac Enzymes: No results found for this basename: CKTOTAL, CKMB, CKMBINDEX, TROPONINI,  in the last 168 hours BNP (last 3 results) No results found for this basename: PROBNP,  in the last 8760 hours CBG: No results found for this basename: GLUCAP,  in the last  168 hours  No results found for this or any previous visit (from the past 240 hour(s)).   Studies: No results found.   Additional labs:   Scheduled Meds: . heparin subcutaneous  5,000 Units Subcutaneous Q8H  . lacosamide (VIMPAT) IV  100 mg Intravenous Q12H  . phenytoin  300 mg Oral QHS  . pregabalin  100 mg Oral BID  . sodium chloride  3 mL Intravenous Q12H   Continuous Infusions:    Active Problems:   Dilantin toxicity   Seizure disorder   History of Intracranial bleed   Thrombocytopenia- chronic   Leukopenia    Time spent: 30 minutes    [facility]  [facility] Pager [phone].   If 8PM-8AM, please contact night-coverage at [email or web address], password [facility] 12/17/2012, 5:29 PM  LOS: 4 days

## 2012-12-17 NOTE — Progress Notes (Addendum)
NEURO HOSPITALIST PROGRESS NOTE   SUBJECTIVE:                                                                                                                         Patient has had total of 3 seizures over last 24 hours. States she feels run down and cannot recall seizure episodes.   OBJECTIVE:                                                                                                                           Vital signs in last 24 hours: Temp:  [97.7 F (36.5 C)-100.1 F (37.8 C)] 98.9 F (37.2 C) (05/16 0600) Pulse Rate:  [70-97] 87 (05/16 0600) Resp:  [18] 18 (05/16 0600) BP: (74-119)/(38-65) 105/55 mmHg (05/16 0600) SpO2:  [91 %-100 %] 100 % (05/16 0600)  Intake/Output from previous day: 05/15 0701 - 05/16 0700 In: -  Out: 1600 [Urine:1600] Intake/Output this shift: Total I/O In: -  Out: 800 [Urine:800] Nutritional status: General  Past Medical History  Diagnosis Date  . Seizures   . DVT (deep venous thrombosis)   . Asthma   . Cerebral hemorrhage      Neurologic Exam:  Mental Status: Alert, oriented, thought content appropriate.  Speech fluent without evidence of aphasia.  Able to follow 3 step commands without difficulty. Less energetic today Cranial Nerves: II: Discs flat bilaterally; Visual fields grossly normal, pupils equal, round, reactive to light and accommodation III,IV, VI: ptosis not present, extra-ocular motions intact bilaterally V,VII: smile symmetric, facial light touch sensation normal bilaterally VIII: hearing normal bilaterally IX,X: gag reflex present XI: bilateral shoulder shrug XII: midline tongue extension Motor: Right : Upper extremity   5/5    Left:     Upper extremity   5/5  Lower extremity   5/5     Lower extremity   5/5 Tone and bulk:normal tone throughout; no atrophy noted Sensory: Pinprick and light touch intact throughout, bilaterally Deep Tendon Reflexes: 2+ and symmetric  throughout Plantars: Up going bilaterally    Lab Results: No results found for this basename: cbc, bmp, coags, chol, tri, ldl, hga1c   Lipid Panel No results found for this basename: CHOL, TRIG, HDL, CHOLHDL, VLDL, LDLCALC,  in the last 72 hours  Studies/Results: No results found.  MEDICATIONS                                                                                                                        Scheduled: . heparin subcutaneous  5,000 Units Subcutaneous Q8H  . lacosamide (VIMPAT) IV  100 mg Intravenous Q12H  . phenytoin  300 mg Oral QHS  . sodium chloride  3 mL Intravenous Q12H    ASSESSMENT/PLAN:                                                                                                            Seizure:  Patient has had three seizures over last 24 hours. One prior to starting Vimpat, one during Vimpat infusion and then a third this AM after receiving Vimpat infusion and first dose of Vimpat.  He dilantin has been restarted and corrected level is 20.5. She has been on Phenobarbital, Mysoline, Depakote, Vimpat and Keppra in the past and feels the Keppra increased her seizures.   Will start Lyrica at 100 mg BID today with plans to increase to TID in 2 days if tolerating well.   Will continue to follow closely with you.   Assessment and plan discussed with with attending physician and they are in agreement.    Felicie Morn PA-C [facility] [phone]  12/17/2012, 8:18 AM   I have seen and evaluated the patient. I have reviewed the above note and made appropriate changes. No further seizures since this morning. I would continue vimapt, lyrica and dilantin for now.   Of note, she has never been completely seizure free, but typically has 5 - 6 per year.   [name], MD [facility] [phone]  If 7pm- 7am, please page neurology on call at [phone].

## 2012-12-17 NOTE — Progress Notes (Signed)
Patient had another tonic-clonic seizure witnessed by a Char[employer] fundraiser. The seizure lasted approximately 1 minute. Patient did   regain consciousness shortly after the seizure. Vitals were: 99.6F,98,22,120/61,96% 2 Liters Nasal Cannula.  PCP on call was notified.  Will continue to monitor patient.

## 2012-12-18 MED ORDER — LACOSAMIDE 50 MG PO TABS
100.0000 mg | ORAL_TABLET | Freq: Two times a day (BID) | ORAL | Status: DC
  Administered 2012-12-18 – 2012-12-19 (×3): 100 mg via ORAL
  Filled 2012-12-18 (×3): qty 2

## 2012-12-18 NOTE — Progress Notes (Signed)
Up in the chair,no complaints of any discomfort. Patient able to ambulate in the hall, verbalized that the numbness in her right hip/upper leg is feels  better.

## 2012-12-18 NOTE — Progress Notes (Addendum)
Subjective: No further seizures since yesterday AM.  States that she has mild gait imbalance at baseline.  She also complains of some left leg numbness, seems to improve with movement in bed.   Exam: Filed Vitals:   12/18/12 0500  BP: 99/52  Pulse: 66  Temp: 98.3 F (36.8 C)  Resp: 18   Gen: In bed, NAD MS: Awake, Alert, appropriate.  OZ:HYQMV, EOMI, fixates and tracks Motor: MAEW Sensory:decreased sensatin over left leg compared to right.   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 She has never been completely seizure free despite multiple medication trials. She has been on phenobarb, dilantin, depakote, keppra, gabapentin and possibly others. She has not been on lyrica prior to this episode as far as she is aware.   She is currently on vimpat, lyrica and dilantin.   Recommendations: 1) Recheck dilantin level tomorrow.  2) Change vimpat to oral.  3) continue lyrica at current dose today, if tolerating would increase to 150mg  BID tomorrow.  4) Continue dilantin 300mg  QHS 5) Up to chair, if leg numbness does not improve, may need to consider MRI.   [name], MD [facility] [phone]  If 7pm- 7am, please page neurology on call at [phone].

## 2012-12-18 NOTE — Progress Notes (Signed)
[facility] PROGRESS NOTE  [name]:[id number] DOB: 1956-01-25 DOA: 12/13/2012 PCP: Pearson Grippe, MD  Primary Neirologist: Dr. Joycelyn Schmid  Brief narrative 57 year old female patient with history of seizure disorder following intracranial bleed requiring surgery 24 years ago, prior history of Dilantin toxicity, has been on several different AEDs in past, was seen at [facility] in January and prescribed a new medication (Onfi) but was unable to fill and hence resumed Dilantin. She was admitted on 12/13/12 due to difficulty walking and was found to be Dilantin toxic.  Assessment/Plan: 1. Dilantin toxicity: Apparently has not tolerated multiple AEDs in the past. This may be secondary to poor oral intake, and there by a reduced albumin. Nutrition consulted. Neurology following. PT to evaluate-home health PT. Dilantin had been held from admission and levels were monitored closely and Dilantin was resumed on 5/15. Corrected serum Dilantin 20.5 on 5/16. 2. Seizure disorder: Patient did not have seizures since admission until morning of 5/15. Since then, she had 3 seizures-1 prior to Vimpat, 1 while getting Vimpat and the third one hours later/early 5/16 morning. Neurology following-continue Dilantin, Vimpat and Lyrica added. No further seizures in last 24 hours. Per neurology, if no seizures in the next 24 hours, possible discharge home on 5/18. 3. Chronic thrombocytopenia: Stable. OP follow up 4. Leukopenia:? Etiology-? Dilantin toxicity. Resolved.  Code Status: Full Family Communication: Discussed with patient Disposition Plan: Home when medically stable-possibly 5/18.   Consultants:  Neurology  Procedures:  None  Antibiotics:  None   HPI/Subjective: No further seizures since yesterday. Some intermittent left lower extremity numbness. Feels better overall and eager to go home.  Objective: Filed Vitals:   12/17/12 0600 12/17/12 1358 12/17/12 2110  12/18/12 0500  BP: 105/55 99/53 104/52 99/52  Pulse: 87 85 76 66  Temp: 98.9 F (37.2 C) 98.3 F (36.8 C) 98.4 F (36.9 C) 98.3 F (36.8 C)  TempSrc: Oral Oral Oral Oral  Resp: 18 18 18 18   Height:      Weight:      SpO2: 100% 98% 96% 99%    Intake/Output Summary (Last 24 hours) at 12/18/12 1202 Last data filed at 12/18/12 1100  Gross per 24 hour  Intake    635 ml  Output   2200 ml  Net  -1565 ml   Filed Weights   12/13/12 1602  Weight: 53.524 kg (118 lb)    Exam:   General exam: Comfortable.  Respiratory system: Clear. No increased work of breathing.  Cardiovascular system: S1 & S2 heard, RRR. No JVD, murmurs, gallops, clicks or pedal edema. Telemetry: Sinus rhythm.  Gastrointestinal system: Abdomen is nondistended, soft and nontender. Normal bowel sounds heard.  Central nervous system: Alert and oriented. No focal neurological deficits.  Extremities: Symmetric 5 x 5 power.   Data Reviewed: Basic Metabolic Panel:  Recent Labs Lab 12/13/12 0500  NA 140  K 3.7  CL 104  CO2 29  GLUCOSE 96  BUN 10  CREATININE 0.56  CALCIUM 8.5   Liver Function Tests:  Recent Labs Lab 12/13/12 0500 12/15/12 0420 12/16/12 0425 12/17/12 0420  AST 17  --   --   --   ALT 9  --   --   --   ALKPHOS 66  --   --   --   BILITOT 0.1*  --   --   --   PROT 5.9*  --   --   --   ALBUMIN 3.4* 2.7* 3.0* 3.5  No results found for this basename: LIPASE, AMYLASE,  in the last 168 hours No results found for this basename: AMMONIA,  in the last 168 hours CBC:  Recent Labs Lab 12/13/12 0500 12/16/12 0425 12/17/12 0420  WBC 3.1* 3.2* 7.6  NEUTROABS 1.3*  --   --   HGB 12.4 12.0 13.3  HCT 37.8 37.1 39.7  MCV 101.1* 101.4* 100.5*  PLT 130* 102* 112*   Cardiac Enzymes: No results found for this basename: CKTOTAL, CKMB, CKMBINDEX, TROPONINI,  in the last 168 hours BNP (last 3 results) No results found for this basename: PROBNP,  in the last 8760 hours CBG: No results  found for this basename: GLUCAP,  in the last 168 hours  No results found for this or any previous visit (from the past 240 hour(s)).   Studies: No results found.   Additional labs:   Scheduled Meds: . heparin subcutaneous  5,000 Units Subcutaneous Q8H  . lacosamide  100 mg Oral BID  . phenytoin  300 mg Oral QHS  . pregabalin  100 mg Oral BID  . sodium chloride  3 mL Intravenous Q12H   Continuous Infusions:    Active Problems:   Dilantin toxicity   Seizure disorder   History of Intracranial bleed   Thrombocytopenia- chronic   Leukopenia    Time spent: 20 minutes    [facility]  [facility] Pager [phone].   If 8PM-8AM, please contact night-coverage at [email or web address], password [facility] 12/18/2012, 12:02 PM  LOS: 5 days

## 2012-12-19 MED ORDER — LACOSAMIDE 200 MG PO TABS: 200.0000 mg | ORAL_TABLET | Freq: Two times a day (BID) | ORAL | Status: DC

## 2012-12-19 MED ORDER — LACOSAMIDE 50 MG PO TABS
100.0000 mg | ORAL_TABLET | Freq: Once | ORAL | Status: AC
  Administered 2012-12-19: 100 mg via ORAL

## 2012-12-19 MED ORDER — LACOSAMIDE 50 MG PO TABS
200.0000 mg | ORAL_TABLET | Freq: Two times a day (BID) | ORAL | Status: DC
  Filled 2012-12-19: qty 2

## 2012-12-19 MED ORDER — PHENYTOIN SODIUM EXTENDED 300 MG PO CAPS: 300.0000 mg | ORAL_CAPSULE | Freq: Every day | ORAL | Status: DC

## 2012-12-19 NOTE — Progress Notes (Signed)
Subjective: Complains of strange feeling in both legs.   Of note, she was on vimpat 200mg  BID prior to admission.   Exam: Filed Vitals:   12/19/12 1335  BP: 100/50  Pulse: 86  Temp: 98.3 F (36.8 C)  Resp: 18   Gen: In bed, NAD MS: Awake, Alert, appropraite WU:[id number], EOMI Motor: maew Sensory:intact to LT Gait: slightly wide based and unsteady(at baseline per patient)   Impression: 57 yo F with dilantin toxicity that is resolved and breakthrough seizures(missed a couple of doses of vimpat after admission). At this time, given that she has not failed vimpat in the settin gof missed doses, I would favor returning her to this dose in addition to a lower dose of dilantin.   Strange feeling in legs may be related to lyrica given it started right after starting this med.   Recommendations: 1) Dilantin pending, if ok, likely could go home today.  2) vimpat 200 bid 3) Can d/c lyrica  [name], MD [facility] [phone]  If 7pm- 7am, please page neurology on call at [phone].

## 2012-12-19 NOTE — Plan of Care (Signed)
Problem: Discharge Progression Outcomes Goal: Activity appropriate for discharge plan Outcome: Adequate for Discharge F/U with home health PT.

## 2012-12-19 NOTE — Discharge Summary (Signed)
Physician Discharge Summary  EESHA SCHMALTZ ION:[id number] DOB: May 20, 1956 DOA: 12/13/2012  PCP: Pearson Grippe, MD  Admit date: 12/13/2012 Discharge date: 12/19/2012  Time spent: Greater than 30 minutes  Recommendations for Outpatient Follow-up:  1. Dr. Pearson Grippe, PCP in 1 week. 2. Dr. Caryn Section, Neurology in 1 week with repeat labs (CBC, Dilantin level & albumin) 3. Home Health PT.  Discharge Diagnoses:  Active Problems:   Dilantin toxicity   Seizure disorder   History of Intracranial bleed   Thrombocytopenia- chronic   Leukopenia   Discharge Condition: Improved & Stable  Diet recommendation: Heart Healthy diet.  Filed Weights   12/13/12 1602  Weight: 53.524 kg (118 lb)    History of present illness:  57 year old female patient with history of seizure disorder following intracranial bleed requiring surgery 24 years ago, prior history of Dilantin toxicity, has been on several different AEDs in past, was seen at [facility] in January and prescribed a new medication (Onfi) but was unable to fill and hence resumed Dilantin. She was admitted on 12/13/12 due to difficulty walking and was found to be Dilantin toxic.  Hospital Course:  1. Dilantin toxicity: Apparently has not tolerated multiple AEDs in the past. This may have been secondary to poor oral intake, and there by reduced albumin. Nutrition was consulted. Neurology consulted. Dilantin had been held from admission and was resumed once her levels approached therapeutic range. She will be discharged on lower than prior home dose (200 mg bid > 300 mg QHS) and will followup with her primary neurologist with repeat levels. Patient's unsteadiness has improved. She lives with her mother and has frequent monitoring from her children and sister. Dilantin level on DC: 14.1 2. Seizure disorder: Patient did not have seizures since admission until morning of 5/15. She then had 3 seizures in a 24 hour period. Neurology  consulted. She was resumed on DIlantin. It was not initially known that she had been on Vimpat 200 mg bid PTA and had not been continued in hospital. This may have led to breakthrough seizures. On discharge she was put back on her prior dose of Vimpat and reduced dose of Dilantin. Lyrica which was briefly given was DCed. She had been seizure free for > 48 hours prior to DC. 3. Chronic thrombocytopenia: Stable. OP follow up  4. Leukopenia:? Etiology-? Dilantin toxicity. Resolved   Procedures:  None    Consultations:  Neurology  Discharge Exam:  Complaints: Denies complaints. No seizure > 48 hours.  Filed Vitals:   12/18/12 0500 12/18/12 2048 12/19/12 0703 12/19/12 1335  BP: 99/52 97/45 101/50 100/50  Pulse: 66 85 73 86  Temp: 98.3 F (36.8 C) 98.3 F (36.8 C) 97.8 F (36.6 C) 98.3 F (36.8 C)  TempSrc: Oral Oral Oral Oral  Resp: 18 18 16 18   Height:      Weight:      SpO2: 99% 99%  100%    General exam: Comfortable.  Respiratory system: Clear. No increased work of breathing.  Cardiovascular system: S1 & S2 heard, RRR. No JVD, murmurs, gallops, clicks or pedal edema. Telemetry: SB in mid 50's-Sinus rhythm.  Gastrointestinal system: Abdomen is nondistended, soft and nontender. Normal bowel sounds heard.  Central nervous system: Alert and oriented. No focal neurological deficits.  Extremities: Symmetric 5 x 5 power.  Discharge Instructions      Discharge Orders   Future Orders Complete By Expires     Call MD for:  persistant dizziness or light-headedness  As directed     Call MD for:  As directed     Comments:      Seizures.    Diet - low sodium heart healthy  As directed     Discharge instructions  As directed     Comments:      No driving. Avoid being in standing water, height's or around dangerous machinery.    Increase activity slowly  As directed         Medication List    STOP taking these medications       ONFI 10 MG tablet  Generic drug:  clobazam       TAKE these medications       albuterol 108 (90 BASE) MCG/ACT inhaler  Commonly known as:  PROVENTIL HFA;VENTOLIN HFA  Inhale 2 puffs into the lungs every 4 (four) hours as needed. For shortness of breath     lacosamide 200 MG Tabs  Commonly known as:  VIMPAT  Take 1 tablet (200 mg total) by mouth 2 (two) times daily.     phenytoin 300 MG ER capsule  Commonly known as:  DILANTIN  Take 1 capsule (300 mg total) by mouth at bedtime.       Follow-up Information   Follow up with Pearson Grippe, MD. Schedule an appointment as soon as possible for a visit in 1 week.   Contact information:   [address] [place] [address] [phone]       Follow up with Joycelyn Schmid, MD. Schedule an appointment as soon as possible for a visit in 1 week. (To be seen with repeat labs (Dilantin level, albumin & CBC))    Contact information:   [address] [place] [address] [phone]        The results of significant diagnostics from this hospitalization (including imaging, microbiology, ancillary and laboratory) are listed below for reference.    Significant Diagnostic Studies: Ct Head Wo Contrast  12/13/2012   *RADIOLOGY REPORT*  Clinical Data: Dizziness, seizure disorder, remote intracranial hemorrhage.  CT HEAD WITHOUT CONTRAST  Technique:  Contiguous axial images were obtained from the base of the skull through the vertex without contrast.  Comparison: 09/09/2012  Findings: Previous right occipital craniotomy with extensive parenchymal loss in the right occipital lobe as before.  Up to 7 mm depression of the bone flap as before. Stable ex vacuo dilatation of the occipital horn right lateral ventricle.  The Atherosclerotic and physiologic intracranial calcifications. There is no evidence of acute intracranial hemorrhage, brain edema, mass lesion, acute infarction,   mass effect, or midline shift. Acute infarct may be inapparent on noncontrast CT.  No  other intra-axial abnormalities are seen, and the ventricles and sulci are within normal limits in size and symmetry.   No abnormal extra-axial fluid collections or masses are identified.  No significant calvarial abnormality.  IMPRESSION: 1. Negative for bleed or other acute intracranial process. 2.  Stable postoperative changes.   Original Report Authenticated By: D. Andria Rhein, MD    Microbiology: No results found for this or any previous visit (from the past 240 hour(s)).   Labs: Basic Metabolic Panel:  Recent Labs Lab 12/13/12 0500  NA 140  K 3.7  CL 104  CO2 29  GLUCOSE 96  BUN 10  CREATININE 0.56  CALCIUM 8.5   Liver Function Tests:  Recent Labs Lab 12/13/12 0500 12/15/12 0420 12/16/12 0425 12/17/12 0420  AST 17  --   --   --  ALT 9  --   --   --   ALKPHOS 66  --   --   --   BILITOT 0.1*  --   --   --   PROT 5.9*  --   --   --   ALBUMIN 3.4* 2.7* 3.0* 3.5   No results found for this basename: LIPASE, AMYLASE,  in the last 168 hours No results found for this basename: AMMONIA,  in the last 168 hours CBC:  Recent Labs Lab 12/13/12 0500 12/16/12 0425 12/17/12 0420  WBC 3.1* 3.2* 7.6  NEUTROABS 1.3*  --   --   HGB 12.4 12.0 13.3  HCT 37.8 37.1 39.7  MCV 101.1* 101.4* 100.5*  PLT 130* 102* 112*   Cardiac Enzymes: No results found for this basename: CKTOTAL, CKMB, CKMBINDEX, TROPONINI,  in the last 168 hours BNP: BNP (last 3 results) No results found for this basename: PROBNP,  in the last 8760 hours CBG: No results found for this basename: GLUCAP,  in the last 168 hours  Additional labs:    Signed:  HONGALGI,ANAND  [facility] 12/19/2012, 3:02 PM

## 2012-12-19 NOTE — Progress Notes (Signed)
Patient discharged home, discharge instructions given and explained to patient and she verbalized understanding, denies any pain/distress. No wound noted, skin intact. Accompanied home by son, home health PT set-up by case manager.

## 2012-12-22 LAB — PHENYTOIN LEVEL, FREE AND TOTAL
Phenytoin Bound: 14.4 mg/L
Phenytoin, Free: 1.8 mg/L (ref 1.0–2.0)
Phenytoin, Total: 16.2 mg/L (ref 10.0–20.0)

## 2013-01-22 ENCOUNTER — Emergency Department (HOSPITAL_COMMUNITY)
Admission: EM | Admit: 2013-01-22 | Discharge: 2013-01-22 | Disposition: A | Discharge status: Home / Self Care | Payer: Medicare Other | Source: Home / Self Care | Attending: Emergency Medicine | Admitting: Emergency Medicine

## 2013-01-22 ENCOUNTER — Emergency Department (HOSPITAL_COMMUNITY): Payer: Medicare Other

## 2013-01-22 ENCOUNTER — Inpatient Hospital Stay (HOSPITAL_COMMUNITY)
Admission: EM | Admit: 2013-01-22 | Discharge: 2013-01-23 | DRG: 101 | Disposition: A | Discharge status: Home / Self Care | Payer: Medicare Other | Attending: Internal Medicine | Admitting: Internal Medicine

## 2013-01-22 ENCOUNTER — Encounter (HOSPITAL_COMMUNITY): Payer: Self-pay | Admitting: Emergency Medicine

## 2013-01-22 ENCOUNTER — Encounter (HOSPITAL_COMMUNITY): Payer: Self-pay

## 2013-01-22 DIAGNOSIS — R569 Unspecified convulsions: Secondary | ICD-10-CM

## 2013-01-22 DIAGNOSIS — R112 Nausea with vomiting, unspecified: Secondary | ICD-10-CM | POA: Diagnosis present

## 2013-01-22 DIAGNOSIS — G40909 Epilepsy, unspecified, not intractable, without status epilepticus: Secondary | ICD-10-CM

## 2013-01-22 DIAGNOSIS — R0789 Other chest pain: Secondary | ICD-10-CM

## 2013-01-22 DIAGNOSIS — R509 Fever, unspecified: Secondary | ICD-10-CM

## 2013-01-22 DIAGNOSIS — F439 Reaction to severe stress, unspecified: Secondary | ICD-10-CM

## 2013-01-22 DIAGNOSIS — E876 Hypokalemia: Secondary | ICD-10-CM | POA: Diagnosis present

## 2013-01-22 DIAGNOSIS — I959 Hypotension, unspecified: Secondary | ICD-10-CM

## 2013-01-22 DIAGNOSIS — I629 Nontraumatic intracranial hemorrhage, unspecified: Secondary | ICD-10-CM

## 2013-01-22 DIAGNOSIS — Z9114 Patient's other noncompliance with medication regimen: Secondary | ICD-10-CM

## 2013-01-22 DIAGNOSIS — D696 Thrombocytopenia, unspecified: Secondary | ICD-10-CM | POA: Diagnosis present

## 2013-01-22 DIAGNOSIS — F17200 Nicotine dependence, unspecified, uncomplicated: Secondary | ICD-10-CM | POA: Diagnosis present

## 2013-01-22 DIAGNOSIS — S0291XA Unspecified fracture of skull, initial encounter for closed fracture: Secondary | ICD-10-CM

## 2013-01-22 DIAGNOSIS — Z79899 Other long term (current) drug therapy: Secondary | ICD-10-CM

## 2013-01-22 DIAGNOSIS — D72819 Decreased white blood cell count, unspecified: Secondary | ICD-10-CM

## 2013-01-22 DIAGNOSIS — Z5181 Encounter for therapeutic drug level monitoring: Secondary | ICD-10-CM

## 2013-01-22 HISTORY — DX: Other chronic pain: G89.29

## 2013-01-22 HISTORY — DX: Dorsalgia, unspecified: M54.9

## 2013-01-22 LAB — URINE MICROSCOPIC-ADD ON

## 2013-01-22 LAB — CBC WITH DIFFERENTIAL/PLATELET
Basophils Absolute: 0 10*3/uL (ref 0.0–0.1)
Basophils Relative: 0 % (ref 0–1)
Eosinophils Absolute: 0.1 10*3/uL (ref 0.0–0.7)
Eosinophils Relative: 1 % (ref 0–5)
HCT: 41.9 % (ref 36.0–46.0)
Lymphocytes Relative: 21 % (ref 12–46)
MCHC: 33.4 g/dL (ref 30.0–36.0)
MCV: 100.2 fL — ABNORMAL HIGH (ref 78.0–100.0)
Monocytes Absolute: 0.4 10*3/uL (ref 0.1–1.0)
Platelets: 129 10*3/uL — ABNORMAL LOW (ref 150–400)
RDW: 12.5 % (ref 11.5–15.5)
WBC: 6.7 10*3/uL (ref 4.0–10.5)

## 2013-01-22 LAB — POCT I-STAT, CHEM 8
BUN: 6 mg/dL (ref 6–23)
Chloride: 103 mEq/L (ref 96–112)
Creatinine, Ser: 0.7 mg/dL (ref 0.50–1.10)
Potassium: 3.3 mEq/L — ABNORMAL LOW (ref 3.5–5.1)
Sodium: 142 mEq/L (ref 135–145)
TCO2: 28 mmol/L (ref 0–100)

## 2013-01-22 LAB — URINALYSIS, ROUTINE W REFLEX MICROSCOPIC
Bilirubin Urine: NEGATIVE
Ketones, ur: NEGATIVE mg/dL
Leukocytes, UA: NEGATIVE
Nitrite: NEGATIVE
Protein, ur: NEGATIVE mg/dL
Urobilinogen, UA: 0.2 mg/dL (ref 0.0–1.0)

## 2013-01-22 LAB — RAPID URINE DRUG SCREEN, HOSP PERFORMED
Barbiturates: NOT DETECTED
Benzodiazepines: NOT DETECTED

## 2013-01-22 LAB — PHENYTOIN LEVEL, TOTAL: Phenytoin Lvl: 3.8 ug/mL — ABNORMAL LOW (ref 10.0–20.0)

## 2013-01-22 MED ORDER — LORAZEPAM 2 MG/ML IJ SOLN
1.0000 mg | Freq: Once | INTRAMUSCULAR | Status: AC
  Administered 2013-01-22: 1 mg via INTRAVENOUS
  Filled 2013-01-22: qty 1

## 2013-01-22 MED ORDER — VALPROATE SODIUM 500 MG/5ML IV SOLN
500.0000 mg | Freq: Once | INTRAVENOUS | Status: AC
  Administered 2013-01-22: 500 mg via INTRAVENOUS
  Filled 2013-01-22: qty 5

## 2013-01-22 MED ORDER — DIVALPROEX SODIUM 250 MG PO DR TAB: 250.0000 mg | DELAYED_RELEASE_TABLET | Freq: Two times a day (BID) | ORAL | Status: DC

## 2013-01-22 NOTE — ED Provider Notes (Signed)
History     CSN: [id number]  Arrival date & time 01/22/13  1349   First MD Initiated Contact with Patient 01/22/13 1351      Chief Complaint  Patient presents with  . Seizures     HPI Pt was seen at 1400.  Per pt, c/o sudden onset and resolution of two episodes of seizure that began today approx 1100 and PTA. Pt states she "has been under a lot of stress" with a family member for the past several days and "thinks that might have caused it."  Denies any complaints currently. States she feels she is at her baseline. Endorses compliance with her meds.  Denies fevers, no CP/SOB, no abd pain, no N/V/D, no headache, no neck or back pain, no visual changes, no focal motor weakness, no tingling/numbness in extremities.        Neuro: Dr. Lorra Hals Past Medical History  Diagnosis Date  . Seizures   . DVT (deep venous thrombosis)   . Asthma   . Cerebral hemorrhage   . Chronic back pain     Past Surgical History  Procedure Laterality Date  . Cesarean section    . Brain surgery      Family History  Problem Relation Age of Onset  . Heart attack Mother   . Heart attack Father     History  Substance Use Topics  . Smoking status: Current Every Day Smoker  . Smokeless tobacco: Never Used  . Alcohol Use: No      Review of Systems ROS: Statement: All systems negative except as marked or noted in the HPI; Constitutional: Negative for fever and chills. ; ; Eyes: Negative for eye pain, redness and discharge. ; ; ENMT: Negative for ear pain, hoarseness, nasal congestion, sinus pressure and sore throat. ; ; Cardiovascular: Negative for chest pain, palpitations, diaphoresis, dyspnea and peripheral edema. ; ; Respiratory: Negative for cough, wheezing and stridor. ; ; Gastrointestinal: Negative for nausea, vomiting, diarrhea, abdominal pain, blood in stool, hematemesis, jaundice and rectal bleeding. . ; ; Genitourinary: Negative for dysuria, flank pain and hematuria. ; ; Musculoskeletal:  Negative for back pain and neck pain. Negative for swelling and trauma.; ; Skin: Negative for pruritus, rash, abrasions, blisters, bruising and skin lesion.; ; Neuro: Negative for headache, lightheadedness and neck stiffness. Negative for weakness, extremity weakness, paresthesias, involuntary movement, +seizure.      Allergies  Codeine; Keppra; and Olive oil  Home Medications   Current Outpatient Rx  Name  Route  Sig  Dispense  Refill  . albuterol (PROVENTIL HFA;VENTOLIN HFA) 108 (90 BASE) MCG/ACT inhaler   Inhalation   Inhale 2 puffs into the lungs every 4 (four) hours as needed. For shortness of breath         . lacosamide (VIMPAT) 200 MG TABS   Oral   Take 1 tablet (200 mg total) by mouth 2 (two) times daily.   60 tablet   0   . phenytoin (DILANTIN) 100 MG ER capsule   Oral   Take 100-200 mg by mouth 2 (two) times daily. Take 1 cap in the morning and 2 caps at bedtime           BP 109/60  Pulse 65  Temp(Src) 98.8 F (37.1 C) (Oral)  Resp 20  SpO2 96%  Physical Exam 1405: Physical examination:  Nursing notes reviewed; Vital signs and O2 SAT reviewed;  Constitutional: Well developed, Well nourished, Well hydrated, In no acute distress; Head:  Normocephalic, atraumatic; Eyes:  EOMI, PERRL, No scleral icterus; ENMT: Mouth and pharynx normal, Mucous membranes moist; Neck: Supple, Full range of motion, No lymphadenopathy; Cardiovascular: Regular rate and rhythm, No gallop; Respiratory: Breath sounds clear & equal bilaterally, No wheezes.  Speaking full sentences with ease, Normal respiratory effort/excursion; Chest: Nontender, Movement normal; Abdomen: Soft, Nontender, Nondistended, Normal bowel sounds; Genitourinary: No CVA tenderness; Extremities: Pulses normal, No tenderness, No edema, No calf edema or asymmetry.; Neuro: AA&Ox3, Major CN grossly intact. No facial droop. Speech clear. No gross focal motor or sensory deficits in extremities.; Skin: Color normal, Warm,  Dry.    ED Course  Procedures     MDM  MDM Reviewed: previous chart, nursing note and vitals Reviewed previous: labs Interpretation: labs and CT scan   Ct Head Wo Contrast 01/22/2013   *RADIOLOGY REPORT*  Clinical Data: Witnessed seizure today.  CT HEAD WITHOUT CONTRAST  Technique:  Contiguous axial images were obtained from the base of the skull through the vertex without contrast.  Comparison: 12/13/2012  Findings: No acute stroke or hemorrhage.  No mass lesion or hydrocephalus.  No extra-axial fluid.  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  There is depression of the bone flap, a non-acute finding.  There is moderate vascular calcification.  Chronic left mastoid fluid/cholesteatoma.  IMPRESSION: Stable exam.  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  No acute findings.   Original Report Authenticated By: Davonna Belling, M.D.     Results for orders placed during the hospital encounter of 01/22/13  PHENYTOIN LEVEL, TOTAL      Result Value Range   Phenytoin Lvl 3.8 (*) 10.0 - 20.0 ug/mL     1550:  Per EPIC chart review and long d/w pt, she has significant hx of dilantin toxicity even with minor adjustments in her dilantin dose; therefore concerned regarding giving pt dose of dilantin. Pt is already taking max dose of vimpat. T/C to Neurology Dr. Thad Ranger, case discussed, including:  HPI, pertinent PM/SHx, VS/PE, dx testing, ED course and treatment:  Shares concern regarding dosing dilantin, requests to instead dose IV depakon 500mg  here and then rx depakote 250mg  PO BID, and have pt f/u in Neuro ofc on Monday.  Pt remains at her baseline, A&O, resps easy, neuro exam unchanged. Pt states she continues to feel "ok," and wants to go home now. Dx and testing, as well as d/w Neuro MD, d/w pt.  Questions answered.  Verb understanding,  agreeable with depakote and to d/c home with outpt f/u.       Laray Anger, DO 01/24/13 1735

## 2013-01-22 NOTE — ED Notes (Signed)
Pt had a seizure, went to [facility] and got discharged approximately 1 hour ago in blue scrubs due to lack of clothing (not psy related). Had another seizure when she arrived home, mom called EMS again and send to [facility] this time for treatment. Pt is alert, oriented, responds, no incontinence noted. Claims taking dilantin regularly at home. Pt appeared weak, Lungs clear 97% on 2 L [place], NSR. No acute distress noted. Seizure precaution initiated.

## 2013-01-22 NOTE — ED Notes (Signed)
Patient transported to CT 

## 2013-01-22 NOTE — ED Notes (Signed)
[id number]<AV> Expected date:<BR> Expected time:<BR> Means of arrival:<BR> Comments:<BR> Seizure post itcal

## 2013-01-22 NOTE — ED Provider Notes (Signed)
History     CSN: [id number]  Arrival date & time 01/22/13  2046   First MD Initiated Contact with Patient 01/22/13 2103      Chief Complaint  Patient presents with  . Seizures    (Consider location/radiation/quality/duration/timing/severity/associated sxs/prior treatment) HPI Comments: Patient was seen and evaluated earlier today.  Status post breakthrough, seizure.  Neurology was consulted.  Due to patient's history of Dilantin toxicity, although she does take Dilantin on a regular basis.  Recommended that she be given, Depakote 500 mg IV, which she was given, and discharge.  By the time.  She arrived home.  She had multiple episodes of vomiting, and had a second seizure.  She was then transported to [facility] evaluation.  Patient is a 57 y.o. female presenting with seizures. The history is provided by the patient.  Seizures Seizure activity on arrival: no   Seizure type:  Unable to specify Initial focality:  Unable to specify Postictal symptoms: confusion   Return to baseline: yes   Severity:  Unable to specify Timing:  Unable to specify Progression:  Worsening Recent head injury:  No recent head injuries History of seizures: yes     Past Medical History  Diagnosis Date  . Seizures   . DVT (deep venous thrombosis)   . Asthma   . Cerebral hemorrhage   . Chronic back pain     Past Surgical History  Procedure Laterality Date  . Cesarean section    . Brain surgery      Family History  Problem Relation Age of Onset  . Heart attack Mother   . Heart attack Father     History  Substance Use Topics  . Smoking status: Current Every Day Smoker  . Smokeless tobacco: Never Used  . Alcohol Use: No    OB History   Grav Para Term Preterm Abortions TAB SAB Ect Mult Living                  Review of Systems  Constitutional: Negative for fever.  HENT: Negative for neck pain and neck stiffness.   Respiratory: Negative for cough and shortness of breath.    Cardiovascular: Negative for chest pain and leg swelling.  Gastrointestinal: Negative for nausea, vomiting and abdominal distention.  Skin: Negative for color change.  Neurological: Positive for seizures. Negative for dizziness, speech difficulty, weakness, numbness and headaches.  All other systems reviewed and are negative.    Allergies  Codeine; Keppra; and Olive oil  Home Medications   Current Outpatient Rx  Name  Route  Sig  Dispense  Refill  . albuterol (PROVENTIL HFA;VENTOLIN HFA) 108 (90 BASE) MCG/ACT inhaler   Inhalation   Inhale 2 puffs into the lungs every 4 (four) hours as needed. For shortness of breath         . divalproex (DEPAKOTE) 250 MG DR tablet   Oral   Take 1 tablet (250 mg total) by mouth 2 (two) times daily.   30 tablet   0   . lacosamide (VIMPAT) 200 MG TABS   Oral   Take 1 tablet (200 mg total) by mouth 2 (two) times daily.   60 tablet   0   . phenytoin (DILANTIN) 100 MG ER capsule   Oral   Take 100-200 mg by mouth 2 (two) times daily. Take 1 cap in the morning and 2 caps at bedtime           BP 112/64  Pulse 84  Temp(Src) 99.1 F (37.3 C) (Oral)  Resp 23  SpO2 98%  Physical Exam  ED Course  Procedures (including critical care time)  Labs Reviewed  CBC WITH DIFFERENTIAL  URINALYSIS, ROUTINE W REFLEX MICROSCOPIC  URINE RAPID DRUG SCREEN (HOSP PERFORMED)   Ct Head Wo Contrast  01/22/2013   *RADIOLOGY REPORT*  Clinical Data: Witnessed seizure today.  CT HEAD WITHOUT CONTRAST  Technique:  Contiguous axial images were obtained from the base of the skull through the vertex without contrast.  Comparison: 12/13/2012  Findings: No acute stroke or hemorrhage.  No mass lesion or hydrocephalus.  No extra-axial fluid.  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  There is depression of the bone flap, a non-acute finding.  There is moderate  vascular calcification.  Chronic left mastoid fluid/cholesteatoma.  IMPRESSION: Stable exam.  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  No acute findings.   Original Report Authenticated By: Davonna Belling, M.D.     No diagnosis found.    MDM   Aggravated CBC i-STAT urine, and urine drug screen to the evaluation.  We not repeat head CT, which was negative.  Earlier today and given the patient.  IV, Ativan to prevent further breakthrough seizures.  After labs have  been resulted.  Most likely will admit        Arman Filter, NP 01/23/13 2043

## 2013-01-22 NOTE — ED Provider Notes (Signed)
Medical screening examination/treatment/procedure(s) were conducted as a shared visit with non-physician practitioner(s) and myself.  I personally evaluated the patient during the encounter  Results for orders placed during the hospital encounter of 01/22/13  CBC WITH DIFFERENTIAL      Result Value Range   WBC 6.7  4.0 - 10.5 K/uL   RBC 4.18  3.87 - 5.11 MIL/uL   Hemoglobin 14.0  12.0 - 15.0 g/dL   HCT 40.9  81.1 - 91.4 %   MCV 100.2 (*) 78.0 - 100.0 fL   MCH 33.5  26.0 - 34.0 pg   MCHC 33.4  30.0 - 36.0 g/dL   RDW 78.2  95.6 - 21.3 %   Platelets 129 (*) 150 - 400 K/uL   Neutrophils Relative % 72  43 - 77 %   Neutro Abs 4.8  1.7 - 7.7 K/uL   Lymphocytes Relative 21  12 - 46 %   Lymphs Abs 1.4  0.7 - 4.0 K/uL   Monocytes Relative 5  3 - 12 %   Monocytes Absolute 0.4  0.1 - 1.0 K/uL   Eosinophils Relative 1  0 - 5 %   Eosinophils Absolute 0.1  0.0 - 0.7 K/uL   Basophils Relative 0  0 - 1 %   Basophils Absolute 0.0  0.0 - 0.1 K/uL  URINALYSIS, ROUTINE W REFLEX MICROSCOPIC      Result Value Range   Color, Urine YELLOW  YELLOW   APPearance TURBID (*) CLEAR   Specific Gravity, Urine 1.018  1.005 - 1.030   pH 7.5  5.0 - 8.0   Glucose, UA NEGATIVE  NEGATIVE mg/dL   Hgb urine dipstick NEGATIVE  NEGATIVE   Bilirubin Urine NEGATIVE  NEGATIVE   Ketones, ur NEGATIVE  NEGATIVE mg/dL   Protein, ur NEGATIVE  NEGATIVE mg/dL   Urobilinogen, UA 0.2  0.0 - 1.0 mg/dL   Nitrite NEGATIVE  NEGATIVE   Leukocytes, UA NEGATIVE  NEGATIVE  URINE RAPID DRUG SCREEN (HOSP PERFORMED)      Result Value Range   Opiates NONE DETECTED  NONE DETECTED   Cocaine NONE DETECTED  NONE DETECTED   Benzodiazepines NONE DETECTED  NONE DETECTED   Amphetamines NONE DETECTED  NONE DETECTED   Tetrahydrocannabinol NONE DETECTED  NONE DETECTED   Barbiturates NONE DETECTED  NONE DETECTED  URINE MICROSCOPIC-ADD ON      Result Value Range   Squamous Epithelial / LPF RARE  RARE   Urine-Other AMORPHOUS URATES/PHOSPHATES     POCT I-STAT, CHEM 8      Result Value Range   Sodium 142  135 - 145 mEq/L   Potassium 3.3 (*) 3.5 - 5.1 mEq/L   Chloride 103  96 - 112 mEq/L   BUN 6  6 - 23 mg/dL   Creatinine, Ser 0.86  0.50 - 1.10 mg/dL   Glucose, Bld 578 (*) 70 - 99 mg/dL   Calcium, Ion 4.69  6.29 - 1.23 mmol/L   TCO2 28  0 - 100 mmol/L   Hemoglobin 14.6  12.0 - 15.0 g/dL   HCT 52.8  41.3 - 24.4 %   Ct Head Wo Contrast  01/22/2013   *RADIOLOGY REPORT*  Clinical Data: Witnessed seizure today.  CT HEAD WITHOUT CONTRAST  Technique:  Contiguous axial images were obtained from the base of the skull through the vertex without contrast.  Comparison: 12/13/2012  Findings: No acute stroke or hemorrhage.  No mass lesion or hydrocephalus.  No extra-axial fluid.  There is postsurgical encephalomalacia  in the right parieto-occipital lobe status post evacuation of an intracranial hemorrhage many years ago.  There is depression of the bone flap, a non-acute finding.  There is moderate vascular calcification.  Chronic left mastoid fluid/cholesteatoma.  IMPRESSION: Stable exam.  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  No acute findings.   Original Report Authenticated By: Davonna Belling, M.D.    Patient seen by me. Patient with recurrent seizure [facility]. Patient will require admission. Patient had a CAT scan of her head done there without any significant new or acute abnormalities. However since she's had a recurrence he seizure after treatment there will need admission.  Shelda Jakes, MD 01/22/13 [phone]

## 2013-01-22 NOTE — ED Notes (Signed)
Return from CT

## 2013-01-22 NOTE — ED Notes (Signed)
Gave patient a blanket. 

## 2013-01-22 NOTE — ED Notes (Signed)
Pt arrives to ed via [facility] post ictal. ems sts pt had witnessed sezure at 1100 today and PTA by family members.  Pt sts hx of "cerebral bleed" in 1988 with surgery.  Pt caox4, pmsx4.  Nad. Pt denies recent illness/injury.

## 2013-01-23 ENCOUNTER — Encounter (HOSPITAL_COMMUNITY): Payer: Self-pay | Admitting: Internal Medicine

## 2013-01-23 DIAGNOSIS — D696 Thrombocytopenia, unspecified: Secondary | ICD-10-CM

## 2013-01-23 DIAGNOSIS — R112 Nausea with vomiting, unspecified: Secondary | ICD-10-CM | POA: Diagnosis present

## 2013-01-23 DIAGNOSIS — F17200 Nicotine dependence, unspecified, uncomplicated: Secondary | ICD-10-CM

## 2013-01-23 DIAGNOSIS — E876 Hypokalemia: Secondary | ICD-10-CM | POA: Diagnosis present

## 2013-01-23 DIAGNOSIS — R569 Unspecified convulsions: Secondary | ICD-10-CM

## 2013-01-23 LAB — BASIC METABOLIC PANEL
BUN: 8 mg/dL (ref 6–23)
Calcium: 8.7 mg/dL (ref 8.4–10.5)
GFR calc non Af Amer: >90 mL/min (ref >90)
Glucose, Bld: 116 mg/dL — ABNORMAL HIGH (ref 70–99)

## 2013-01-23 LAB — HEPATIC FUNCTION PANEL
ALT: 12 U/L (ref 0–35)
Alkaline Phosphatase: 77 U/L (ref 39–117)
Bilirubin, Direct: <0.1 mg/dL (ref 0.0–0.3)
Total Bilirubin: 0.3 mg/dL (ref 0.3–1.2)
Total Protein: 6 g/dL (ref 6.0–8.3)

## 2013-01-23 LAB — CBC
MCH: 34.4 pg — ABNORMAL HIGH (ref 26.0–34.0)
MCHC: 34.1 g/dL (ref 30.0–36.0)
Platelets: 139 10*3/uL — ABNORMAL LOW (ref 150–400)
RDW: 12.5 % (ref 11.5–15.5)

## 2013-01-23 LAB — PHENYTOIN LEVEL, TOTAL: Phenytoin Lvl: 9.7 ug/mL — ABNORMAL LOW (ref 10.0–20.0)

## 2013-01-23 MED ORDER — SODIUM CHLORIDE 0.9 % IJ SOLN
3.0000 mL | Freq: Two times a day (BID) | INTRAMUSCULAR | Status: DC
  Administered 2013-01-23: 3 mL via INTRAVENOUS

## 2013-01-23 MED ORDER — ALUM & MAG HYDROXIDE-SIMETH 200-200-20 MG/5ML PO SUSP: 30.0000 mL | Freq: Four times a day (QID) | ORAL | Status: DC | PRN

## 2013-01-23 MED ORDER — DEXTROSE 5 % IV SOLN
250.0000 mg | Freq: Two times a day (BID) | INTRAVENOUS | Status: DC
  Administered 2013-01-23: 250 mg via INTRAVENOUS
  Filled 2013-01-23: qty 2.5

## 2013-01-23 MED ORDER — DIVALPROEX SODIUM 250 MG PO DR TAB
250.0000 mg | DELAYED_RELEASE_TABLET | Freq: Two times a day (BID) | ORAL | Status: DC
  Administered 2013-01-23: 250 mg via ORAL
  Filled 2013-01-23 (×2): qty 1

## 2013-01-23 MED ORDER — ZOLPIDEM TARTRATE 5 MG PO TABS: 5.0000 mg | ORAL_TABLET | Freq: Every evening | ORAL | Status: DC | PRN

## 2013-01-23 MED ORDER — LORAZEPAM 2 MG/ML IJ SOLN: 1.0000 mg | INTRAMUSCULAR | Status: DC | PRN

## 2013-01-23 MED ORDER — ACETAMINOPHEN 325 MG PO TABS: 650.0000 mg | ORAL_TABLET | Freq: Four times a day (QID) | ORAL | Status: DC | PRN

## 2013-01-23 MED ORDER — POTASSIUM CHLORIDE 10 MEQ/100ML IV SOLN
10.0000 meq | INTRAVENOUS | Status: AC
  Administered 2013-01-23 (×2): 10 meq via INTRAVENOUS
  Filled 2013-01-23: qty 100

## 2013-01-23 MED ORDER — SODIUM CHLORIDE 0.9 % IV SOLN
500.0000 mg | Freq: Once | INTRAVENOUS | Status: AC
  Administered 2013-01-23: 500 mg via INTRAVENOUS
  Filled 2013-01-23: qty 10

## 2013-01-23 MED ORDER — PHENYTOIN SODIUM EXTENDED 100 MG PO CAPS: 100.0000 mg | ORAL_CAPSULE | Freq: Two times a day (BID) | ORAL | Status: DC

## 2013-01-23 MED ORDER — SODIUM CHLORIDE 0.9 % IV SOLN
INTRAVENOUS | Status: DC
  Administered 2013-01-23: 100 mL via INTRAVENOUS

## 2013-01-23 MED ORDER — LACOSAMIDE 50 MG PO TABS
200.0000 mg | ORAL_TABLET | Freq: Two times a day (BID) | ORAL | Status: DC
  Administered 2013-01-23: 200 mg via ORAL
  Filled 2013-01-23: qty 4

## 2013-01-23 MED ORDER — PNEUMOCOCCAL VAC POLYVALENT 25 MCG/0.5ML IJ INJ
0.5000 mL | INJECTION | Freq: Once | INTRAMUSCULAR | Status: AC
  Administered 2013-01-23: 0.5 mL via INTRAMUSCULAR
  Filled 2013-01-23: qty 0.5

## 2013-01-23 MED ORDER — ONDANSETRON HCL 4 MG/2ML IJ SOLN: 4.0000 mg | Freq: Four times a day (QID) | INTRAMUSCULAR | Status: DC | PRN

## 2013-01-23 MED ORDER — PHENYTOIN SODIUM 50 MG/ML IJ SOLN
100.0000 mg | Freq: Four times a day (QID) | INTRAMUSCULAR | Status: DC
  Administered 2013-01-23: 100 mg via INTRAVENOUS
  Filled 2013-01-23 (×5): qty 2

## 2013-01-23 MED ORDER — HYDROMORPHONE HCL PF 1 MG/ML IJ SOLN: 0.5000 mg | INTRAMUSCULAR | Status: DC | PRN

## 2013-01-23 MED ORDER — ACETAMINOPHEN 650 MG RE SUPP: 650.0000 mg | Freq: Four times a day (QID) | RECTAL | Status: DC | PRN

## 2013-01-23 MED ORDER — ONDANSETRON HCL 4 MG PO TABS: 4.0000 mg | ORAL_TABLET | Freq: Four times a day (QID) | ORAL | Status: DC | PRN

## 2013-01-23 MED ORDER — PHENYTOIN SODIUM EXTENDED 100 MG PO CAPS
200.0000 mg | ORAL_CAPSULE | Freq: Every day | ORAL | Status: DC
  Filled 2013-01-23: qty 2

## 2013-01-23 MED ORDER — PHENYTOIN SODIUM EXTENDED 100 MG PO CAPS
100.0000 mg | ORAL_CAPSULE | Freq: Every day | ORAL | Status: DC
  Administered 2013-01-23: 100 mg via ORAL
  Filled 2013-01-23: qty 1

## 2013-01-23 MED ORDER — OXYCODONE HCL 5 MG PO TABS: 5.0000 mg | ORAL_TABLET | ORAL | Status: DC | PRN

## 2013-01-23 NOTE — Discharge Summary (Addendum)
Physician Discharge Summary  [name] ZOX:[id number] DOB: [birth year] DOA: 01/22/2013  PCP: Pearson Grippe, MD  Admit date: 01/22/2013 Discharge date: 01/23/2013  Time spent: 35 minutes  Recommendations for Outpatient Follow-up:  1. 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 She had been seen in the ED at [facility] earlier in the Day and was administered IV Depakote and released to home. She returned due to symptoms of nausea and vomiting and recurrence of seizures. She is on Dilantin, Vimpat, and Depakote therapy for her seizures, and was found to have a sub-therapeutic dilantin level a t 3.8. She has had dilantin toxicity in the past and Is worried about adjustment in her dilantin therapy. She has had questionable compliance with her medications. She denies having any fevers r chills or chest pain or SOB. She was referred for medical admission   Hospital Course:  Seizures: - ? History of non-compliance. - Her dilantin level was sub-therapeutic. Recheck after loading dose showed therapeutic level once corrected with the albumin. - had no further seizure while in house. - follow up with Primary neurologist this week.  Procedures:  None  Consultations:  none  Discharge Exam: Filed Vitals:   01/22/13 2200 01/22/13 2300 01/23/13 0156 01/23/13 0600  BP: 113/72 108/56 102/65 98/60  Pulse: 81 81 86 71  Temp:  99 F (37.2 C) 99.2 F (37.3 C) 98.2 F (36.8 C)  TempSrc:  Oral Oral Oral  Resp: 18 25 20 18   Height:   5\' 7"  (1.702 m)   Weight:   51.4 kg (113 lb 5.1 oz)   SpO2: 100% 100% 92% 95%     General: A&Ox3 Cardiovascular: RRR Respiratory: good air movement CTA B/L  Discharge Instructions      Discharge Orders   Future Orders Complete By Expires     Diet - low sodium heart healthy  As directed     Increase activity slowly  As directed         Medication List    TAKE these medications       albuterol 108 (90 BASE) MCG/ACT inhaler  Commonly known as:  PROVENTIL HFA;VENTOLIN HFA  Inhale 2 puffs into the lungs every 4 (four) hours as needed. For shortness of breath     divalproex 250 MG DR tablet  Commonly known as:  DEPAKOTE  Take 1 tablet (250 mg total) by mouth 2 (two) times daily.     lacosamide 200 MG Tabs  Commonly known as:  VIMPAT  Take 1 tablet (200 mg total) by mouth 2 (two) times daily.     phenytoin 100 MG ER capsule  Commonly known as:  DILANTIN  Take 100-200 mg by mouth 2 (two) times daily. Take 1 cap in the morning and 2 caps at bedtime       Allergies  Allergen Reactions  . Codeine Nausea And Vomiting  . Keppra (Levetiracetam) Other (See Comments)    Causes seizures.   [name] Rash   Follow-up Information   Follow up with PENUMALLI,VIKRAM, MD. (this week.)    Contact  information:   [address] [place] [address] [phone]        The results of significant diagnostics from this hospitalization (including imaging, microbiology, ancillary and laboratory) are listed below for reference.    Significant Diagnostic Studies: Ct Head Wo Contrast  01/22/2013   *RADIOLOGY REPORT*  Clinical Data: Witnessed seizure today.  CT HEAD WITHOUT CONTRAST  Technique:  Contiguous axial images were obtained from the base of the skull through the vertex without contrast.  Comparison: 12/13/2012  Findings: No acute stroke or hemorrhage.  No mass lesion or hydrocephalus.  No extra-axial fluid.  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  There is depression of the bone flap, a non-acute finding.  There is moderate vascular calcification.  Chronic left mastoid fluid/cholesteatoma.  IMPRESSION: Stable exam.  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  No acute findings.   Original Report Authenticated By: Davonna Belling, M.D.    Microbiology: No results found for this or any previous visit (from the past 240 hour(s)).   Labs: Basic Metabolic Panel:  Recent Labs Lab 01/22/13 2205 01/23/13 0540  NA 142 139  K 3.3* 3.6  CL 103 105  CO2  --  27  GLUCOSE 100* 116*  BUN 6 8  CREATININE 0.70 0.70  CALCIUM  --  8.7   Liver Function Tests: No results found for this basename: AST, ALT, ALKPHOS, BILITOT, PROT, ALBUMIN,  in the last 168 hours No results found for this basename: LIPASE, AMYLASE,  in the last 168 hours No results found for this basename: AMMONIA,  in the last 168 hours CBC:  Recent Labs Lab 01/22/13 2159 01/22/13 2205 01/23/13 0540  WBC 6.7  --  5.3  NEUTROABS 4.8  --   --   HGB 14.0 14.6 13.1  HCT 41.9 43.0 38.4  MCV 100.2*  --  100.8*  PLT 129*  --  139*   Cardiac Enzymes: No results found for this basename: CKTOTAL, CKMB, CKMBINDEX, TROPONINI,  in the last 168 hours BNP: BNP (last 3 results) No results found for this basename: PROBNP,  in the last 8760 hours CBG: No results found for this basename: GLUCAP,  in the last 168 hours     Signed:  Marinda Elk  [facility] 01/23/2013, 9:59 AM

## 2013-01-23 NOTE — H&P (Signed)
[facility] History and Physical  [name]:[id number] DOB: 07-16-1956 DOA: 01/22/2013  Referring physician:  EDP PCP: Pearson Grippe, MD  Specialists:   Chief Complaint:  Seizures  HPI: [name] is a 57 y.o. 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   She had been seen in the ED at [facility] earlier in the Day and was administered IV Depakote and released to home.  She returned due to symptoms of nausea and vomiting and recurrence of seizures.  She is on Dilantin, Vimpat, and Depakote therapy for her seizures, and was found to have a sub-therapeutic dilantin level a t 3.8.  She has had dilantin toxicity in the past and  Is worried about adjustment in her dilantin therapy.   She has had questionable compliance with her medications.    She denies having any fevers r chills or chest pain or SOB.   She was referred for medical admission   Review of Systems: The patient denies anorexia, fever, chills, headaches, weight loss, vision loss, diplopia, dizziness, decreased hearing, rhinitis, hoarseness, chest pain, syncope, dyspnea on exertion, peripheral edema, balance deficits, cough, hemoptysis, abdominal pain, nausea, vomiting, diarrhea, constipation, hematemesis, melena, hematochezia, severe indigestion/heartburn, dysuria, hematuria, incontinence, muscle weakness, suspicious skin lesions, transient blindness, difficulty walking, depression, unusual weight change, abnormal bleeding, enlarged lymph nodes, angioedema, and breast masses.    Past Medical History  Diagnosis Date  . Seizures   . DVT (deep venous thrombosis)   . Asthma   . Cerebral hemorrhage   . Chronic back pain     Past Surgical History  Procedure Laterality Date  . Cesarean section    . Brain surgery      craniotomy with hematoma evacuation    Prior to Admission medications   Medication Sig Start Date End Date Taking? Authorizing Provider  albuterol  (PROVENTIL HFA;VENTOLIN HFA) 108 (90 BASE) MCG/ACT inhaler Inhale 2 puffs into the lungs every 4 (four) hours as needed. For shortness of breath    Historical Provider, MD  divalproex (DEPAKOTE) 250 MG DR tablet Take 1 tablet (250 mg total) by mouth 2 (two) times daily. 01/22/13   Laray Anger, DO  lacosamide (VIMPAT) 200 MG TABS Take 1 tablet (200 mg total) by mouth 2 (two) times daily. 12/19/12   Elease Etienne, MD  phenytoin (DILANTIN) 100 MG ER capsule Take 100-200 mg by mouth 2 (two) times daily. Take 1 cap in the morning and 2 caps at bedtime    Historical Provider, MD    Allergies  Allergen Reactions  . Codeine Nausea And Vomiting  . Keppra (Levetiracetam) Other (See Comments)    Causes seizures.   [name] Rash     Social History:   She is staying at her mother's home to help her mother since a recent MI.  She smokes and is down to 1/4 pack per day, previously she had been smoking 2 packs per day.      reports that she has been smoking.  She has never used smokeless tobacco. She reports that she does not drink alcohol or use illicit drugs.     Family History  Problem Relation Age of Onset  . Heart attack Mother   . Heart attack Father        Physical Exam:  GEN:  Pleasant  57 y.o. thin Caucasian female  examined  and in no acute distress; cooperative with exam Filed Vitals:  01/22/13 2047 01/22/13 2050 01/22/13 2200 01/22/13 2300  BP:  112/64 113/72 108/56  Pulse:  84 81 81  Temp: 99.1 F (37.3 C) 99.1 F (37.3 C)  99 F (37.2 C)  TempSrc:  Oral  Oral  Resp:  23 18 25   SpO2:  98% 100% 100%   Blood pressure 108/56, pulse 81, temperature 99 F (37.2 C), temperature source Oral, resp. rate 25, SpO2 100.00%. PSYCH: She is alert and oriented x4; does not appear anxious does not appear depressed; affect is normal HEENT: Normocephalic and Atraumatic, Mucous membranes pink; PERRLA; EOM intact; Fundi:  Benign;  No scleral icterus, Nares: Patent, Oropharynx: Clear,  Fair Dentition, Neck:  FROM, no cervical lymphadenopathy nor thyromegaly or carotid bruit; no JVD; Breasts:: Not examined CHEST WALL: No tenderness CHEST: Normal respiration, clear to auscultation bilaterally HEART: Regular rate and rhythm; no murmurs rubs or gallops BACK: No kyphosis or scoliosis; no CVA tenderness ABDOMEN: Positive Bowel Sounds, Scaphoid, soft non-tender; no masses, no organomegaly. Rectal Exam: Not done EXTREMITIES: No cyanosis, clubbing or edema; no ulcerations. Genitalia: not examined PULSES: 2+ and symmetric SKIN: Normal hydration no rash or ulceration CNS: Cranial nerves 2-12 grossly intact no focal neurologic deficit    Labs on Admission:  Basic Metabolic Panel:  Recent Labs Lab 01/22/13 2205  NA 142  K 3.3*  CL 103  GLUCOSE 100*  BUN 6  CREATININE 0.70   Liver Function Tests: No results found for this basename: AST, ALT, ALKPHOS, BILITOT, PROT, ALBUMIN,  in the last 168 hours No results found for this basename: LIPASE, AMYLASE,  in the last 168 hours No results found for this basename: AMMONIA,  in the last 168 hours CBC:  Recent Labs Lab 01/22/13 2159 01/22/13 2205  WBC 6.7  --   NEUTROABS 4.8  --   HGB 14.0 14.6  HCT 41.9 43.0  MCV 100.2*  --   PLT 129*  --    Cardiac Enzymes: No results found for this basename: CKTOTAL, CKMB, CKMBINDEX, TROPONINI,  in the last 168 hours  BNP (last 3 results) No results found for this basename: PROBNP,  in the last 8760 hours CBG: No results found for this basename: GLUCAP,  in the last 168 hours  Radiological Exams on Admission: Ct Head Wo Contrast  01/22/2013   *RADIOLOGY REPORT*  Clinical Data: Witnessed seizure today.  CT HEAD WITHOUT CONTRAST  Technique:  Contiguous axial images were obtained from the base of the skull through the vertex without contrast.  Comparison: 12/13/2012  Findings: No acute stroke or hemorrhage.  No mass lesion or hydrocephalus.  No extra-axial fluid.  There is  postsurgical encephalomalacia in the right parieto-occipital lobe status post evacuation of an intracranial hemorrhage many years ago.  There is depression of the bone flap, a non-acute finding.  There is moderate vascular calcification.  Chronic left mastoid fluid/cholesteatoma.  IMPRESSION: Stable exam.  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  No acute findings.   Original Report Authenticated By: Davonna Belling, M.D.      Assessment/Plan   Generalized Seizures Active Problems:   Nausea and vomiting   Hypokalemia   Thrombocytopenia- chronic   Tobacco dependence    1.   Generalized Seizures-  Seizures since 1988 ICH S/P Craniotomy, currently sub-therapeutic with Dilantin, but also on Vimpat, and Depakote therapies.  Reload with 500 mg IV dilantin and adjust dilantin doses but monitor due to previous dilantin toxicity  2.   Nausea and Vomiting-  Anti-Emetics PRN  3.    Hypokalemia-  Due to #2, Replete K+  4.   Thrombocytopenia- due to Neuroleptic Rx.   Monitor trend.     5.   Tobacco Dependence-  Has done well and is now down to 5 cigarettes daily, counseled to continue to decrease until Tobacco cessation.         Code Status:    FULL CODE Family Communication:    No Family Present Disposition Plan:    Return to Home  Time spent:  62 Minutes  Ron Parker [facility] Pager [phone]  If 7PM-7AM, please contact night-coverage [email or web address] Password [facility] 01/23/2013, 12:54 AM

## 2013-01-23 NOTE — Progress Notes (Signed)
Clinical Social Work Department BRIEF PSYCHOSOCIAL ASSESSMENT 01/23/2013  Patient:  [name], [name]     Account Number:  [id number]     Admit date:  01/22/2013  Clinical Social Worker:  Doroteo Glassman  Date/Time:  01/23/2013 11:37 AM  Referred by:  Physician  Date Referred:  01/23/2013 Referred for  Homelessness   Other Referral:   Interview type:  Patient Other interview type:    PSYCHOSOCIAL DATA Living Status:  FAMILY Admitted from facility:   Level of care:   Primary support name:  Bonita Quin Primary support relationship to patient:  PARENT Degree of support available:   strong    CURRENT CONCERNS Current Concerns  Other - See comment   Other Concerns:   Referral received for homelessness issues    SOCIAL WORK ASSESSMENT / PLAN Met with Pt to discuss homelessness issues.    Pt denied that she was homeless and found it mildly amusing that a consult was placed for CSW.  Pt stated that she splits her time between staying with her mother, who has health concerns, and her daughter, who has 2 small children.  Pt named several other family members with whom she could stay, if need be.    No futher CSW issues identified.    CSW thanked Pt for her time.    CSW to sign off.   Assessment/plan status:  No Further Intervention Required Other assessment/ plan:   Information/referral to community resources:   n/a    PATIENT'S/FAMILY'S RESPONSE TO PLAN OF CARE: Pt thanked CSW for time and assistance.   Providence Crosby, LCSWA Clinical Social Work [phone]

## 2013-01-23 NOTE — Progress Notes (Signed)
Pt discharged home with husband and daughter.  Pt verbalized understanding of signed discharge papers which RN reviewed with pt.  Pt has no further questions.  IV removed. Telemetry removed. Pt escorted by wheelchair outside by RN.  Pt with one bag of belongings.  Room double checked for personal belongings, none found.

## 2013-01-25 NOTE — ED Provider Notes (Signed)
Medical screening examination/treatment/procedure(s) were conducted as a shared visit with non-physician practitioner(s) and myself.  I personally evaluated the patient during the encounter  Shelda Jakes, MD 01/25/13 [phone]

## 2013-01-27 ENCOUNTER — Other Ambulatory Visit: Payer: Self-pay

## 2013-01-27 MED ORDER — PHENYTOIN 50 MG PO CHEW: 50.0000 mg | CHEWABLE_TABLET | Freq: Two times a day (BID) | ORAL | Status: DC

## 2013-01-27 MED ORDER — PHENYTOIN SODIUM EXTENDED 100 MG PO CAPS: 100.0000 mg | ORAL_CAPSULE | Freq: Two times a day (BID) | ORAL | Status: DC

## 2013-02-09 ENCOUNTER — Telehealth: Payer: Self-pay | Admitting: Diagnostic Neuroimaging

## 2013-02-09 MED ORDER — DIVALPROEX SODIUM 250 MG PO DR TAB: 250.0000 mg | DELAYED_RELEASE_TABLET | Freq: Two times a day (BID) | ORAL | Status: DC

## 2013-02-09 NOTE — Telephone Encounter (Signed)
We have not prescribed this medication for the patient.  Perhaps the pharmacy was contacting the MD that prescribed it in the ED?  We have noted in patients chart ([facility]) that Depakote was not effective in the past.  ED advised the patient to contact our office for an appt last month to follow up on her visit there.  Will send refill, since patient was started on this med by ED physician and note patient needs to schedule follow up appt.

## 2013-02-09 NOTE — Telephone Encounter (Signed)
Pt called this morning is needing her divalproex (Depakote) 250 mg dr tablet. Pt has called pharmacy they stated they faxed it over and pt states she took her last one this morning.

## 2013-02-20 ENCOUNTER — Other Ambulatory Visit: Payer: Self-pay | Admitting: Diagnostic Neuroimaging

## 2013-02-22 NOTE — Telephone Encounter (Signed)
Per med list on 06/22 (ED Report)  All Administrations of divalproex (DEPAKOTE) DR tablet 250 mg    User(s) Action Time Recorded Time Dose/Rate Route Site Comment Action Reason Patient Supplied   Lestine Box, RN 01/23/13 1104 01/23/13 1104 250 mg Oral   Given  No

## 2013-03-04 ENCOUNTER — Other Ambulatory Visit: Payer: Self-pay | Admitting: Diagnostic Neuroimaging

## 2013-03-04 NOTE — Telephone Encounter (Signed)
Dr Marjory Lies is out of the office.  Forwarding request to Dr Exie Parody

## 2013-03-06 MED ORDER — LACOSAMIDE 200 MG PO TABS: 200.0000 mg | ORAL_TABLET | Freq: Two times a day (BID) | ORAL | Status: DC

## 2013-03-07 NOTE — Telephone Encounter (Signed)
Rx signed and faxed.

## 2013-03-08 ENCOUNTER — Other Ambulatory Visit: Payer: Self-pay | Admitting: Diagnostic Neuroimaging

## 2013-03-11 ENCOUNTER — Telehealth: Payer: Self-pay | Admitting: Diagnostic Neuroimaging

## 2013-03-11 NOTE — Telephone Encounter (Signed)
Please refer to the previous not below.

## 2013-03-11 NOTE — Telephone Encounter (Signed)
I called daughter. Patient is supposed to be following up with [facility] epilepsy clinic, until seizures can be stabilized. They understand plan. -VRP

## 2013-03-11 NOTE — Telephone Encounter (Signed)
Patients daughter called and would like for her mother to have dilantin levels checked because she is having alot of seizures. Patient was last seen on 08-09-2012 patient had EEG done at [facility] on 10-22-2012 which is scanned in epic. I tried calling the patient could not get anyone on the phone please advise.

## 2013-03-14 NOTE — Telephone Encounter (Signed)
Dr. Marjory Lies spoke with patient on 03/11/13. Closing telephone encounter.

## 2013-04-02 ENCOUNTER — Other Ambulatory Visit: Payer: Self-pay | Admitting: Neurology

## 2013-04-11 ENCOUNTER — Ambulatory Visit: Payer: Self-pay | Admitting: Diagnostic Neuroimaging

## 2013-04-14 ENCOUNTER — Observation Stay (HOSPITAL_COMMUNITY)
Admission: EM | Admit: 2013-04-14 | Discharge: 2013-04-15 | Disposition: A | Discharge status: Home / Self Care | Payer: Medicare Other | Attending: Family Medicine | Admitting: Family Medicine

## 2013-04-14 ENCOUNTER — Encounter (HOSPITAL_COMMUNITY): Payer: Self-pay

## 2013-04-14 DIAGNOSIS — J45909 Unspecified asthma, uncomplicated: Secondary | ICD-10-CM | POA: Insufficient documentation

## 2013-04-14 DIAGNOSIS — R0789 Other chest pain: Secondary | ICD-10-CM

## 2013-04-14 DIAGNOSIS — R569 Unspecified convulsions: Secondary | ICD-10-CM

## 2013-04-14 DIAGNOSIS — Z9114 Patient's other noncompliance with medication regimen: Secondary | ICD-10-CM

## 2013-04-14 DIAGNOSIS — Z86718 Personal history of other venous thrombosis and embolism: Secondary | ICD-10-CM | POA: Insufficient documentation

## 2013-04-14 DIAGNOSIS — Z79899 Other long term (current) drug therapy: Secondary | ICD-10-CM | POA: Insufficient documentation

## 2013-04-14 DIAGNOSIS — R112 Nausea with vomiting, unspecified: Secondary | ICD-10-CM

## 2013-04-14 DIAGNOSIS — I629 Nontraumatic intracranial hemorrhage, unspecified: Secondary | ICD-10-CM

## 2013-04-14 DIAGNOSIS — I959 Hypotension, unspecified: Secondary | ICD-10-CM

## 2013-04-14 DIAGNOSIS — G40909 Epilepsy, unspecified, not intractable, without status epilepticus: Principal | ICD-10-CM

## 2013-04-14 DIAGNOSIS — F17200 Nicotine dependence, unspecified, uncomplicated: Secondary | ICD-10-CM

## 2013-04-14 DIAGNOSIS — Z91148 Patient's other noncompliance with medication regimen for other reason: Secondary | ICD-10-CM

## 2013-04-14 DIAGNOSIS — D696 Thrombocytopenia, unspecified: Secondary | ICD-10-CM

## 2013-04-14 DIAGNOSIS — D72819 Decreased white blood cell count, unspecified: Secondary | ICD-10-CM

## 2013-04-14 DIAGNOSIS — R509 Fever, unspecified: Secondary | ICD-10-CM

## 2013-04-14 DIAGNOSIS — F439 Reaction to severe stress, unspecified: Secondary | ICD-10-CM

## 2013-04-14 DIAGNOSIS — E876 Hypokalemia: Secondary | ICD-10-CM

## 2013-04-14 LAB — COMPREHENSIVE METABOLIC PANEL
AST: 17 U/L (ref 0–37)
Albumin: 3.6 g/dL (ref 3.5–5.2)
Alkaline Phosphatase: 52 U/L (ref 39–117)
BUN: 11 mg/dL (ref 6–23)
Chloride: 100 mEq/L (ref 96–112)
Potassium: 3.8 mEq/L (ref 3.5–5.1)
Sodium: 136 mEq/L (ref 135–145)
Total Bilirubin: 0.2 mg/dL — ABNORMAL LOW (ref 0.3–1.2)
Total Protein: 6.3 g/dL (ref 6.0–8.3)

## 2013-04-14 LAB — CBC WITH DIFFERENTIAL/PLATELET
Basophils Absolute: 0 10*3/uL (ref 0.0–0.1)
Basophils Relative: 0 % (ref 0–1)
Eosinophils Absolute: 0.1 10*3/uL (ref 0.0–0.7)
MCH: 34 pg (ref 26.0–34.0)
MCHC: 33.6 g/dL (ref 30.0–36.0)
Monocytes Relative: 6 % (ref 3–12)
Neutro Abs: 2.1 10*3/uL (ref 1.7–7.7)
Neutrophils Relative %: 46 % (ref 43–77)
Platelets: 149 10*3/uL — ABNORMAL LOW (ref 150–400)
RDW: 12.8 % (ref 11.5–15.5)

## 2013-04-14 LAB — URINALYSIS, ROUTINE W REFLEX MICROSCOPIC
Hgb urine dipstick: NEGATIVE
Nitrite: NEGATIVE
Specific Gravity, Urine: 1.019 (ref 1.005–1.030)
Urobilinogen, UA: 0.2 mg/dL (ref 0.0–1.0)
pH: 7 (ref 5.0–8.0)

## 2013-04-14 LAB — VALPROIC ACID LEVEL: Valproic Acid Lvl: 14.4 ug/mL — ABNORMAL LOW (ref 50.0–100.0)

## 2013-04-14 NOTE — ED Notes (Signed)
Per EMS family witnessed pt having a grand mal seizure that lasted about 2 minutes  Pt has extensive hx of seizures  Pt has had 5 this month which is abnormal for her  Pt is due for evaluation at [facility] on the 17th for her seizures  This is pt's second seizure this week  Pt's postictal state usually last only about 5 minutes  Pt is alert and oriented upon arrival   Pt has no complaints upon arrival

## 2013-04-14 NOTE — ED Notes (Signed)
PT ABLE TO AMBULATE TO BATHROOM W/O ASSISTANCE

## 2013-04-14 NOTE — ED Provider Notes (Signed)
CSN: [id number]     Arrival date & time 04/14/13  2034 History   First MD Initiated Contact with Patient 04/14/13 2127     Chief Complaint  Patient presents with  . Seizures   (Consider location/radiation/quality/duration/timing/severity/associated sxs/prior Treatment) HPI Comments: Patient with hx seizure disorder following cerebral hemorrhage over 20 years ago presents with seizure today and increased frequency of seizures this month.  Patient generally has 1-2 seizures per month but has already had 5 seizures this month (in the past 11 days).  She takes Dilantin, Vinpat, and Depakote and has been taking her medications as directed.  She has an appointment at [facility] for second opinion regarding medications next week.  Neurologist is currently Dr Marjory Lies.    Patient states she was lying on her bed when her mother heard her having a seizure (kicking her dresser).  Pt has no memory of this and remembers waking up in the ambulance. Denies any pain anywhere.  Denies biting tongue or any bowel or bladder incontinence.  Denies headache.  She spoke with mother on the phone and learned it was a typical grand mal seizure and lasted about the same amount of time as usual, postictal for about the same amount of time as usual.  Denies recent illness.  Denies fevers, chills, sore throat, cough, SOB, chest pain, abdominal pain, N/V/D, urinary symptoms.   The history is provided by the patient.    Past Medical History  Diagnosis Date  . Seizures   . DVT (deep venous thrombosis)   . Asthma   . Cerebral hemorrhage   . Chronic back pain    Past Surgical History  Procedure Laterality Date  . Cesarean section    . Brain surgery      craniotomy with hematoma evacuation   Family History  Problem Relation Age of Onset  . Heart attack Mother   . Heart attack Father    History  Substance Use Topics  . Smoking status: Current Every Day Smoker  . Smokeless tobacco: Never Used  . Alcohol Use: No   OB  History   Grav Para Term Preterm Abortions TAB SAB Ect Mult Living                 Review of Systems  Constitutional: Negative for fever and chills.  Respiratory: Negative for cough and shortness of breath.   Cardiovascular: Negative for chest pain.  Gastrointestinal: Negative for nausea, vomiting, abdominal pain and diarrhea.  Genitourinary: Negative for dysuria, urgency and frequency.  Musculoskeletal: Negative for myalgias.  Skin: Negative for wound.  Neurological: Positive for seizures. Negative for weakness, numbness and headaches.  Psychiatric/Behavioral: Negative for confusion.    Allergies  Codeine; Keppra; and Olive oil  Home Medications   Current Outpatient Rx  Name  Route  Sig  Dispense  Refill  . albuterol (PROVENTIL HFA;VENTOLIN HFA) 108 (90 BASE) MCG/ACT inhaler   Inhalation   Inhale 2 puffs into the lungs every 4 (four) hours as needed for wheezing or shortness of breath.          . divalproex (DEPAKOTE) 250 MG DR tablet   Oral   Take 250 mg by mouth 2 (two) times daily.         Marland Kitchen lacosamide (VIMPAT) 200 MG TABS   Oral   Take 1 tablet (200 mg total) by mouth 2 (two) times daily.   60 tablet   1     Pharmacy Fax [phone]   . phenytoin (DILANTIN) 100 MG  ER capsule   Oral   Take 100 mg by mouth 2 (two) times daily.          BP 107/54  Pulse 66  Temp(Src) 98.2 F (36.8 C) (Oral)  Resp 16  SpO2 94% Physical Exam  Nursing note and vitals reviewed. Constitutional: She appears well-developed and well-nourished. No distress.  HENT:  Head: Normocephalic and atraumatic.  Eyes: Conjunctivae are normal.  Neck: Neck supple.  Cardiovascular: Normal rate and regular rhythm.   Pulmonary/Chest: Effort normal and breath sounds normal. No respiratory distress. She has no wheezes. She has no rales.  Abdominal: Soft. She exhibits no distension. There is no tenderness. There is no rebound and no guarding.  Musculoskeletal: Normal range of motion.   Neurological: She is alert. She exhibits normal muscle tone.  CN II-XII intact, EOMs intact, no pronator drift, grip strengths equal bilaterally; strength 5/5 in all extremities, sensation intact in all extremities; finger to nose, heel to shin, rapid alternating movements normal; gait is normal.   Pt does have a tremor.    Skin: She is not diaphoretic.  Psychiatric: She has a normal mood and affect. Her behavior is normal.    ED Course  Procedures (including critical care time) Labs Review Labs Reviewed  CBC WITH DIFFERENTIAL - Abnormal; Notable for the following:    RBC 3.79 (*)    MCV 101.3 (*)    Platelets 149 (*)    All other components within normal limits  COMPREHENSIVE METABOLIC PANEL - Abnormal; Notable for the following:    Total Bilirubin 0.2 (*)    All other components within normal limits  VALPROIC ACID LEVEL - Abnormal; Notable for the following:    Valproic Acid Lvl 14.4 (*)    All other components within normal limits  PHENYTOIN LEVEL, TOTAL - Abnormal; Notable for the following:    Phenytoin Lvl 4.6 (*)    All other components within normal limits  URINALYSIS, ROUTINE W REFLEX MICROSCOPIC - Abnormal; Notable for the following:    APPearance CLOUDY (*)    All other components within normal limits   Imaging Review No results found.  MDM   1. Seizure    Pt with hx seizure disorder with seizure today, h/o visits for seizure with low levels of her medications, has also had supratherapeutic/toxic level of dilantin in the past.  Discussed patient with Dr Clarene Duke.  Given difficulty in controlling patient's medication levels, I spoke with [facility] for observation admission to monitor patient while loading medication.  Admitted to team 8, telemetry.  I placed holding orders.     [place], PA-C 04/15/13 [phone]

## 2013-04-14 NOTE — ED Notes (Signed)
Bed: WA20 Expected date: 04/14/13 Expected time: 8:30 PM Means of arrival: Ambulance Comments: 57 yo F  Seizure

## 2013-04-15 ENCOUNTER — Encounter (HOSPITAL_COMMUNITY): Payer: Self-pay | Admitting: *Deleted

## 2013-04-15 MED ORDER — HEPARIN SODIUM (PORCINE) 5000 UNIT/ML IJ SOLN
5000.0000 [IU] | Freq: Three times a day (TID) | INTRAMUSCULAR | Status: DC
  Administered 2013-04-15: 5000 [IU] via SUBCUTANEOUS
  Filled 2013-04-15 (×5): qty 1

## 2013-04-15 MED ORDER — DIVALPROEX SODIUM 500 MG PO DR TAB
500.0000 mg | DELAYED_RELEASE_TABLET | Freq: Two times a day (BID) | ORAL | Status: DC
  Administered 2013-04-15: 500 mg via ORAL
  Filled 2013-04-15 (×2): qty 1

## 2013-04-15 MED ORDER — NICOTINE 14 MG/24HR TD PT24
14.0000 mg | MEDICATED_PATCH | Freq: Every day | TRANSDERMAL | Status: DC
  Filled 2013-04-15: qty 1

## 2013-04-15 MED ORDER — DIVALPROEX SODIUM 500 MG PO DR TAB: 500.0000 mg | DELAYED_RELEASE_TABLET | Freq: Two times a day (BID) | ORAL | Status: DC

## 2013-04-15 MED ORDER — LACOSAMIDE 50 MG PO TABS
200.0000 mg | ORAL_TABLET | Freq: Two times a day (BID) | ORAL | Status: DC
  Administered 2013-04-15: 200 mg via ORAL
  Filled 2013-04-15: qty 4

## 2013-04-15 MED ORDER — SODIUM CHLORIDE 0.9 % IV SOLN
750.0000 mg | Freq: Once | INTRAVENOUS | Status: AC
  Administered 2013-04-15: 02:00:00 750 mg via INTRAVENOUS
  Filled 2013-04-15: qty 15

## 2013-04-15 MED ORDER — DIVALPROEX SODIUM 250 MG PO DR TAB
250.0000 mg | DELAYED_RELEASE_TABLET | Freq: Two times a day (BID) | ORAL | Status: DC
  Filled 2013-04-15 (×2): qty 1

## 2013-04-15 MED ORDER — SODIUM CHLORIDE 0.9 % IV SOLN
INTRAVENOUS | Status: DC
  Administered 2013-04-15: 02:00:00 via INTRAVENOUS

## 2013-04-15 MED ORDER — SODIUM CHLORIDE 0.9 % IJ SOLN: 3.0000 mL | Freq: Two times a day (BID) | INTRAMUSCULAR | Status: DC

## 2013-04-15 MED ORDER — PHENYTOIN SODIUM EXTENDED 100 MG PO CAPS
100.0000 mg | ORAL_CAPSULE | Freq: Every day | ORAL | Status: DC
  Filled 2013-04-15: qty 1

## 2013-04-15 MED ORDER — VALPROATE SODIUM 500 MG/5ML IV SOLN
500.0000 mg | Freq: Once | INTRAVENOUS | Status: AC
  Administered 2013-04-15: 500 mg via INTRAVENOUS
  Filled 2013-04-15: qty 5

## 2013-04-15 MED ORDER — ALBUTEROL SULFATE HFA 108 (90 BASE) MCG/ACT IN AERS: 2.0000 | INHALATION_SPRAY | RESPIRATORY_TRACT | Status: DC | PRN

## 2013-04-15 MED ORDER — PHENYTOIN SODIUM EXTENDED 100 MG PO CAPS: ORAL_CAPSULE | ORAL | Status: DC

## 2013-04-15 MED ORDER — PHENYTOIN SODIUM EXTENDED 100 MG PO CAPS
200.0000 mg | ORAL_CAPSULE | Freq: Every morning | ORAL | Status: DC
  Administered 2013-04-15: 10:00:00 200 mg via ORAL
  Filled 2013-04-15: qty 2

## 2013-04-15 NOTE — Discharge Summary (Signed)
Physician Discharge Summary  [name] WJX:[id number] DOB: [birth year] DOA: 04/14/2013  PCP: [name] Grippe, MD  Admit date: 04/14/2013 Discharge date: 04/15/2013  Time spent: 50* minutes  Recommendations for Outpatient Follow-up:  1. Follow up PCP in 2 weeks 2. Follow up [name] in one week  Discharge Diagnoses:  Principal Problem:   Recurrent seizures Active Problems:   Seizure disorder   Tobacco dependence   Thrombocytopenia- chronic   Discharge Condition: *Stable  Diet recommendation: Regular diet  Filed Weights   04/15/13 0031 04/15/13 0146  Weight: 51.4 kg (113 lb 5.1 oz) 52 kg (114 lb 10.2 oz)    History of present illness:  57 y.o. female has a past medical history significant for seizure disorder following an intracerebral hemorrhage in 1988, presents to the emergency room following a grand mal seizure at home today. This is her fifth seizure this month. She is currently on Dilantin, Vimpat and Depakote. She has a history of dilantin toxicity and seizures with low dilantin levels. Patient expresses her frustration today as she states that she is very compliant with her medications and is not sure why she is getting these fluctuations in her levels. In the ED her Dilantin and Valproic acid levels were low again. Denies fever/chills/changes in her medications. Denies chest pain or shortness of breath. Has no headache. Her primary neurologist is [name]. There have been discussions that she gets seen at [name] for further input and she has an appointment on 9/17 - per patient. She is now alert, oriented and without complaints, hospitalist was asked for admission for observation.    [name] Course:  Seizure disorder  - Patient on multiple medications in the past, currently on Dilantin, Vimpat and Depakote. She has been having difficulties with maintaining adequate therapeutic blood levels with previous hospitalizations for Dilantin toxicity and previous  hospitalizations for seizures in the setting of low AED blood levels. Her Dilantin  level was 4.7, and she was given a load of IV dilantin last night. Today the level has come up to 15.5. She also had low depakote level of 14.4 and she received Iv Depakote and the level has come up to 28.4. Called and discussed with Neurologist on call [name], and he recommends to increase the Dilantin to total 300 mg po daily and increase the Depakote to 500 mg po BID. Patient will follow up [name] in one week. Patient had no seizures in the [name].  Tobacco abuse - counseled for cessation.  Chronic thrombocytopenia - stable  Asthma - when necessary albuterol  Procedures:  *None  Consultations:  None  Discharge Exam: Filed Vitals:   04/15/13 1003  BP: 105/50  Pulse: 68  Temp:   Resp:     General: Appear in no acute distress Cardiovascular: *S1s2 RRR Respiratory: *Clear bilaterally Ext ; No edema  Discharge Instructions  Discharge Orders   Future Orders Complete By Expires   Diet - low sodium heart healthy  As directed    Increase activity slowly  As directed        Medication List         albuterol 108 (90 BASE) MCG/ACT inhaler  Commonly known as:  PROVENTIL HFA;VENTOLIN HFA  Inhale 2 puffs into the lungs every 4 (four) hours as needed for wheezing or shortness of breath.     divalproex 500 MG [name] tablet  Commonly known as:  DEPAKOTE  Take 1 tablet (500 mg total) by mouth 2 (two) times daily.  lacosamide 200 MG Tabs tablet  Commonly known as:  VIMPAT  Take 1 tablet (200 mg total) by mouth 2 (two) times daily.     phenytoin 100 MG ER capsule  Commonly known as:  DILANTIN  Take one tablet in morning and two tablets at night daily       Allergies  Allergen Reactions  . Codeine Nausea And Vomiting  . Keppra [Levetiracetam] Other (See Comments)    Causes seizures.   [name] Oil Rash       Follow-up Information   Follow up with [name] Grippe, MD In 2  weeks.   Specialty:  Internal Medicine   Contact information:   [address] [place] [address] [phone]        The results of significant diagnostics from this hospitalization (including imaging, microbiology, ancillary and laboratory) are listed below for reference.    Significant Diagnostic Studies: No results found.  Microbiology: No results found for this or any previous visit (from the past 240 hour([name])).   Labs: Basic Metabolic Panel:  Recent Labs Lab 04/14/13 2155  NA 136  K 3.8  CL 100  CO2 28  GLUCOSE 88  BUN 11  CREATININE 0.61  CALCIUM 9.1   Liver Function Tests:  Recent Labs Lab 04/14/13 2155  AST 17  ALT 13  ALKPHOS 52  BILITOT 0.2*  PROT 6.3  ALBUMIN 3.6   No results found for this basename: LIPASE, AMYLASE,  in the last 168 hours No results found for this basename: AMMONIA,  in the last 168 hours CBC:  Recent Labs Lab 04/14/13 2155  WBC 4.6  NEUTROABS 2.1  HGB 12.9  HCT 38.4  MCV 101.3*  PLT 149*   Cardiac Enzymes: No results found for this basename: CKTOTAL, CKMB, CKMBINDEX, TROPONINI,  in the last 168 hours BNP: BNP (last 3 results) No results found for this basename: PROBNP,  in the last 8760 hours CBG: No results found for this basename: GLUCAP,  in the last 168 hours     Signed:  Caleel Kiner [name]  [facility] 04/15/2013, 10:11 AM

## 2013-04-15 NOTE — H&P (Signed)
[facility] History and Physical  [name] ZOX:[id number] DOB: 1955-08-12 DOA: 04/14/2013  Referring physician: Dr. Clarene [name] PCP: [name] Grippe, MD  Specialists: Neurology in am  Chief Complaint: Seizure  HPI: [name] is a 57 y.o. female has a past medical history significant for seizure disorder following an intracerebral hemorrhage in 1988, presents to the emergency room following a grand mal seizure at home today. This is her fifth seizure this month. She is currently on Dilantin, Vimpat and Depakote. She has a history of dilantin toxicity and seizures with low dilantin levels. Patient expresses her frustration today as she states that she is very compliant with her medications and is not sure why she is getting these fluctuations in her levels. In the ED her Dilantin and Valproic acid levels were low again. Denies fever/chills/changes in her medications. Denies chest pain or shortness of breath. Has no headache. Her primary neurologist is [name]. There have been discussions that she gets seen at [facility] for further input and she has an appointment on 9/17 - per patient. She is now alert, oriented and without complaints, hospitalist was asked for admission for observation.   Review of Systems: as per HPI otherwise negative  Past Medical History  Diagnosis Date  . Seizures   . DVT (deep venous thrombosis)   . Asthma   . Cerebral hemorrhage   . Chronic back pain    Past Surgical History  Procedure Laterality Date  . Cesarean section    . Brain surgery      craniotomy with hematoma evacuation   Social History:  reports that she has been smoking.  She has never used smokeless tobacco. She reports that she does not drink alcohol or use illicit drugs.  Allergies  Allergen Reactions  . Codeine Nausea And Vomiting  . Keppra [Levetiracetam] Other (See Comments)    Causes seizures.   [name] Oil Rash    Family History  Problem Relation Age of Onset  . Heart  attack Mother   . Heart attack Father     Prior to Admission medications   Medication Sig Start Date End Date Taking? Authorizing Provider  albuterol (PROVENTIL HFA;VENTOLIN HFA) 108 (90 BASE) MCG/ACT inhaler Inhale 2 puffs into the lungs every 4 (four) hours as needed for wheezing or shortness of breath.    Yes Historical Provider, MD  divalproex (DEPAKOTE) 250 MG DR tablet Take 250 mg by mouth 2 (two) times daily.   Yes Historical Provider, MD  lacosamide (VIMPAT) 200 MG TABS Take 1 tablet (200 mg total) by mouth 2 (two) times daily. 03/04/13  Yes [name] Riley, MD  phenytoin (DILANTIN) 100 MG ER capsule Take 100 mg by mouth 2 (two) times daily.   Yes Historical Provider, MD   Physical Exam: Filed Vitals:   04/14/13 2045 04/14/13 2101 04/15/13 0031  BP:  107/54   Pulse:  66   Temp:  98.2 F (36.8 C)   TempSrc:  Oral   Resp:  16   Height:   5' 6.93" (1.7 m)  Weight:   51.4 kg (113 lb 5.1 oz)  SpO2: 99% 94%      General:  No apparent distress  Eyes: PERRL, EOMI, no scleral icterus  ENT: moist oropharynx  Neck: supple, no JVD  Cardiovascular: regular rate without MRG; 2+ peripheral pulses  Respiratory: CTA biL, good air movement without wheezing, rhonchi or crackled  Abdomen: soft, non tender to palpation, positive bowel sounds, no guarding, no rebound  Skin: no rashes  Musculoskeletal: no peripheral edema  Psychiatric: normal mood and affect  Neurologic: CN 2-12 grossly intact, MS 5/5 in all 4  Labs on Admission:  Basic Metabolic Panel:  Recent Labs Lab 04/14/13 2155  NA 136  K 3.8  CL 100  CO2 28  GLUCOSE 88  BUN 11  CREATININE 0.61  CALCIUM 9.1   Liver Function Tests:  Recent Labs Lab 04/14/13 2155  AST 17  ALT 13  ALKPHOS 52  BILITOT 0.2*  PROT 6.3  ALBUMIN 3.6   CBC:  Recent Labs Lab 04/14/13 2155  WBC 4.6  NEUTROABS 2.1  HGB 12.9  HCT 38.4  MCV 101.3*  PLT 149*   EKG: Independently reviewed. Sinus  rhythm  Assessment/Plan Principal Problem:   Recurrent seizures Active Problems:   Seizure disorder   Tobacco dependence   Thrombocytopenia- chronic  Seizure disorder - Patient on multiple medications in the past, currently on Dilantin, Vimpat and Depakote. She has been having difficulties with maintaining adequate therapeutic blood levels with previous hospitalizations for Dilantin toxicity and previous hospitalizations for seizures in the setting of low AED blood levels. Restart her home medications per pharmacy consult, appreciate input regarding iv loading doses. Will ask for neurology consult in the morning. There are previous reports regarding noncompliance, however patient adamantly denies that today and states that she takes all her medications at the same time every day. - recheck levels in am Tobacco abuse - counseled for cessation. Nicotine patch. Chronic thrombocytopenia - stable Asthma - when necessary albuterol DVT prophylaxis - heparin  Code Status: Full  Family Communication: none  Disposition Plan: Observation  Time spent: 68  [name] M. Elvera Lennox, MD [facility] Pager [phone]  If 7PM-7AM, please contact night-coverage [email or web address] Password [facility] 04/15/2013, 12:42 AM

## 2013-04-15 NOTE — Progress Notes (Signed)
Called to get report on patient, nurse is busy.  Left message with secretary to call me when she is available for report.  Ernesta Amble, RN

## 2013-04-15 NOTE — Progress Notes (Signed)
Utilization review completed.  

## 2013-04-15 NOTE — Progress Notes (Signed)
MEDICATION RELATED CONSULT NOTE - INITIAL   Pharmacy Consult for IV Phenytoin load Indication: Seizures  Allergies  Allergen Reactions  . Codeine Nausea And Vomiting  . Keppra [Levetiracetam] Other (See Comments)    Causes seizures.   [name] Rash    Patient Measurements: Height: 5\' 5"  (165.1 cm) Weight: 114 lb 10.2 oz (52 kg) IBW/kg (Calculated) : 57   Vital Signs: Temp: 98.2 F (36.8 C) (09/12 0522) Temp src: Oral (09/12 0522) BP: 90/41 mmHg (09/12 0522) Pulse Rate: 66 (09/12 0522) Intake/Output from previous day: 09/11 0701 - 09/12 0700 In: 462.5 [I.V.:462.5] Out: 750 [Urine:750] Intake/Output from this shift:    Labs:  Recent Labs  04/14/13 2155  WBC 4.6  HGB 12.9  HCT 38.4  PLT 149*  CREATININE 0.61  ALBUMIN 3.6  PROT 6.3  AST 17  ALT 13  ALKPHOS 52  BILITOT 0.2*   Estimated Creatinine Clearance: 65.2 ml/min (by C-G formula based on Cr of 0.61).   Microbiology: No results found for this or any previous visit (from the past 720 hour(s)).  Medical History: Past Medical History  Diagnosis Date  . Seizures   . DVT (deep venous thrombosis)   . Asthma   . Cerebral hemorrhage   . Chronic back pain     Medications:  Scheduled:  . sodium chloride   Intravenous STAT  . divalproex  250 mg Oral BID  . heparin  5,000 Units Subcutaneous Q8H  . lacosamide  200 mg Oral BID  . nicotine  14 mg Transdermal Daily  . sodium chloride  3 mL Intravenous Q12H   Infusions:    Assessment: 57 yo with history of seizure disorder following intracerebral hemorrhage in 1988.  Pt on po Dilantin @ home 100mg  bid - pt claims to be compliant with medications despite Phenytoin level = 4.6 mcg/ml on admission.  Alb =3.6  Goal of Therapy:  Phenytoin level = 10-20 mcg/ml  Plan:   Phenytoin 750mg  IV x1 @ 0230 (~14.4 mg/kg)  F/U after neurology consult that Phenytoin is restarted.  Lorenza Evangelist 04/15/2013,0400am

## 2013-04-18 ENCOUNTER — Ambulatory Visit: Payer: Self-pay | Admitting: Diagnostic Neuroimaging

## 2013-04-18 NOTE — ED Provider Notes (Signed)
Medical screening examination/treatment/procedure(s) were performed by non-physician practitioner and as supervising physician I was immediately available for consultation/collaboration.   Laray Anger, DO 04/18/13 [phone]

## 2013-04-27 ENCOUNTER — Emergency Department (HOSPITAL_COMMUNITY): Payer: Medicare Other

## 2013-04-27 ENCOUNTER — Emergency Department (HOSPITAL_COMMUNITY)
Admission: EM | Admit: 2013-04-27 | Discharge: 2013-04-27 | Disposition: A | Discharge status: Home / Self Care | Payer: Medicare Other | Attending: Emergency Medicine | Admitting: Emergency Medicine

## 2013-04-27 ENCOUNTER — Encounter (HOSPITAL_COMMUNITY): Payer: Self-pay | Admitting: Neurology

## 2013-04-27 DIAGNOSIS — Z79899 Other long term (current) drug therapy: Secondary | ICD-10-CM | POA: Insufficient documentation

## 2013-04-27 DIAGNOSIS — F17200 Nicotine dependence, unspecified, uncomplicated: Secondary | ICD-10-CM | POA: Insufficient documentation

## 2013-04-27 DIAGNOSIS — J45909 Unspecified asthma, uncomplicated: Secondary | ICD-10-CM | POA: Insufficient documentation

## 2013-04-27 DIAGNOSIS — R112 Nausea with vomiting, unspecified: Secondary | ICD-10-CM | POA: Insufficient documentation

## 2013-04-27 DIAGNOSIS — I629 Nontraumatic intracranial hemorrhage, unspecified: Secondary | ICD-10-CM

## 2013-04-27 DIAGNOSIS — G40909 Epilepsy, unspecified, not intractable, without status epilepticus: Secondary | ICD-10-CM | POA: Insufficient documentation

## 2013-04-27 DIAGNOSIS — Z86718 Personal history of other venous thrombosis and embolism: Secondary | ICD-10-CM | POA: Insufficient documentation

## 2013-04-27 DIAGNOSIS — Z9114 Patient's other noncompliance with medication regimen: Secondary | ICD-10-CM

## 2013-04-27 DIAGNOSIS — Z8679 Personal history of other diseases of the circulatory system: Secondary | ICD-10-CM | POA: Insufficient documentation

## 2013-04-27 LAB — CBC WITH DIFFERENTIAL/PLATELET
Basophils Absolute: 0 10*3/uL (ref 0.0–0.1)
Basophils Relative: 0 % (ref 0–1)
Eosinophils Absolute: 0 10*3/uL (ref 0.0–0.7)
Eosinophils Relative: 0 % (ref 0–5)
HCT: 40.8 % (ref 36.0–46.0)
MCHC: 34.3 g/dL (ref 30.0–36.0)
MCV: 101.5 fL — ABNORMAL HIGH (ref 78.0–100.0)
Monocytes Absolute: 0.2 10*3/uL (ref 0.1–1.0)
Platelets: 108 10*3/uL — ABNORMAL LOW (ref 150–400)
RDW: 12.9 % (ref 11.5–15.5)

## 2013-04-27 LAB — URINALYSIS, ROUTINE W REFLEX MICROSCOPIC
Bilirubin Urine: NEGATIVE
Ketones, ur: NEGATIVE mg/dL
Leukocytes, UA: NEGATIVE
Nitrite: NEGATIVE
Protein, ur: NEGATIVE mg/dL
Specific Gravity, Urine: 1.014 (ref 1.005–1.030)
Urobilinogen, UA: 0.2 mg/dL (ref 0.0–1.0)

## 2013-04-27 LAB — POCT I-STAT, CHEM 8
BUN: 13 mg/dL (ref 6–23)
Chloride: 105 mEq/L (ref 96–112)
HCT: 43 % (ref 36.0–46.0)
Hemoglobin: 14.6 g/dL (ref 12.0–15.0)
Potassium: 4.1 mEq/L (ref 3.5–5.1)

## 2013-04-27 LAB — RAPID URINE DRUG SCREEN, HOSP PERFORMED
Amphetamines: NOT DETECTED
Barbiturates: NOT DETECTED
Opiates: NOT DETECTED
Tetrahydrocannabinol: NOT DETECTED

## 2013-04-27 MED ORDER — METOCLOPRAMIDE HCL 5 MG/ML IJ SOLN
10.0000 mg | Freq: Once | INTRAMUSCULAR | Status: AC
  Administered 2013-04-27: 10 mg via INTRAVENOUS
  Filled 2013-04-27: qty 2

## 2013-04-27 MED ORDER — ONDANSETRON 4 MG PO TBDP: 4.0000 mg | ORAL_TABLET | Freq: Three times a day (TID) | ORAL | Status: DC | PRN

## 2013-04-27 NOTE — ED Notes (Signed)
Pt is talking more, pt reports nausea is relieved and is asking for something to eat. Pt is a x 4. States she took her medication this morning .

## 2013-04-27 NOTE — ED Provider Notes (Signed)
CSN: [id number]     Arrival date & time 04/27/13  1136 History   First MD Initiated Contact with Patient 04/27/13 1137     Chief Complaint  Patient presents with  . Seizures   Patient is a 57 y.o. female presenting with seizures.  Seizures  Pt is a 57 y/o female with PMHx of intractable seizure disorder following cerebral hemorrhage over 20 yrs ago who presents with new onset seizure today.  Typically has had about 1-2 seizures per month, controlled in outpatient setting.  However, pt has had upwards of 6 seizures this month, last on 04/14/13 requiring hospitalization, w/ recent f/u at [facility] for second opinion.  Pt is seen by Dr. Marjory Lies and on Dilantin, Depakote, and Vimpat for her seizures currently and her second opinion from the neurologist at [facility] had stopped her dilantin due to variations in her levels.  As well, she has a history of dilantin toxicity and seizures with low dilantin levels.  For this episode, pt was reportedly in bed, starting have tonic-clonic episode falling out of bed.  At that point, her mother came into her room, placed her back in bed, and put a bucket next to her bed due to concern for nausea/vomiting.  Pt had another episode of generalized tonic-clonic activity, falling out of bed again, this time hitting her head on the bucket.  Does not recall falling out of bed a second time or trauma to her head.  She continued to have some nausea, a few episodes of vomiting and was transported via EMS to the ED.  Denies fever, chills, dysuria, cough, shortness of breath, biting her tongue, bowel/bladder incontinence, HA, blurred vision, cervical pain, recent sick contacts, diarrhea.    Past Medical History  Diagnosis Date  . Seizures   . DVT (deep venous thrombosis)   . Asthma   . Cerebral hemorrhage   . Chronic back pain    Past Surgical History  Procedure Laterality Date  . Cesarean section    . Brain surgery      craniotomy with hematoma evacuation   Family History   Problem Relation Age of Onset  . Heart attack Mother   . Heart attack Father    History  Substance Use Topics  . Smoking status: Current Every Day Smoker  . Smokeless tobacco: Never Used  . Alcohol Use: No   OB History   Grav Para Term Preterm Abortions TAB SAB Ect Mult Living                 Review of Systems  Constitutional: Negative for fever and chills.  HENT: Positive for dental problem. Negative for sore throat, facial swelling, trouble swallowing and neck pain.   Eyes: Negative for photophobia.  Respiratory: Negative for cough and shortness of breath.   Cardiovascular: Negative for chest pain.  Gastrointestinal: Positive for nausea and vomiting. Negative for abdominal pain and diarrhea.  Genitourinary: Negative for dysuria, urgency and frequency.  Musculoskeletal: Negative for myalgias.  Skin: Negative for wound.  Neurological: Positive for seizures. Negative for weakness, numbness and headaches.  Psychiatric/Behavioral: Negative for confusion.    Allergies  Codeine; Keppra; and Olive oil  Home Medications   Current Outpatient Rx  Name  Route  Sig  Dispense  Refill  . albuterol (PROVENTIL HFA;VENTOLIN HFA) 108 (90 BASE) MCG/ACT inhaler   Inhalation   Inhale 2 puffs into the lungs every 4 (four) hours as needed for wheezing or shortness of breath.          Marland Kitchen  divalproex (DEPAKOTE) 500 MG DR tablet   Oral   Take 1 tablet (500 mg total) by mouth 2 (two) times daily.   60 tablet   2   . lacosamide (VIMPAT) 200 MG TABS   Oral   Take 1 tablet (200 mg total) by mouth 2 (two) times daily.   60 tablet   1     Pharmacy Fax [phone]   . phenytoin (DILANTIN) 100 MG ER capsule      Take one tablet in morning and two tablets at night daily   90 capsule   2    BP 104/41  Pulse 67  Temp(Src) 97.3 F (36.3 C) (Oral)  Resp 18  SpO2 96% Physical Exam  Nursing note and vitals reviewed. Constitutional: She is oriented to person, place, and time. She appears  well-developed and well-nourished. No distress.  HENT:  Head: Normocephalic and atraumatic.  Mouth/Throat: Oropharynx is clear and moist. Mucous membranes are not dry. No oral lesions. Dental caries present. No edematous or lacerations.  nml tongue w/o laceration or dentition indentation   Eyes: Conjunctivae and EOM are normal. Pupils are equal, round, and reactive to light.  Neck:  In Cervical Collar, no cervical midline TTP  Cardiovascular: Normal rate, regular rhythm, normal heart sounds, intact distal pulses and normal pulses.   No murmur heard. Pulmonary/Chest: Effort normal and breath sounds normal. No respiratory distress. She has no wheezes. She has no rales.  Abdominal: Soft. She exhibits no distension. There is no tenderness. There is no rebound and no guarding.  Musculoskeletal: Normal range of motion.  No TTP along B/L greater tuberosities of humerus, greater trochanters, patella/lateral knee, or medial/lateral malleoli    Neurological: She is alert and oriented to person, place, and time. She has normal reflexes. She exhibits normal muscle tone.  CN II-XII intact, EOMs intact, strength 5/5 in all extremities, sensation intact in all extremities; finger to nose, heel to shin, rapid alternating movements normal.   Skin: Skin is warm, dry and intact. No abrasion and no rash noted. She is not diaphoretic.  Psychiatric: She has a normal mood and affect. Her behavior is normal.    ED Course  Procedures (including critical care time)  Date: 04/27/2013  Rate: 65  Rhythm: normal sinus rhythm  QRS Axis: borderline Right Axis Deviation   Intervals: normal  ST/T Wave abnormalities: normal  Conduction Disutrbances: none  Narrative Interpretation: borderline RAD, no changes from last EKG  Old EKG Reviewed: No significant changes noted  Labs Review Labs Reviewed  CBC WITH DIFFERENTIAL - Abnormal; Notable for the following:    MCV 101.5 (*)    MCH 34.8 (*)    Platelets 108 (*)     Neutrophils Relative % 80 (*)    All other components within normal limits  VALPROIC ACID LEVEL - Abnormal; Notable for the following:    Valproic Acid Lvl <10.0 (*)    All other components within normal limits  URINALYSIS, ROUTINE W REFLEX MICROSCOPIC - Abnormal; Notable for the following:    APPearance CLOUDY (*)    All other components within normal limits  POCT I-STAT, CHEM 8 - Abnormal; Notable for the following:    Glucose, Bld 108 (*)    All other components within normal limits  PHENYTOIN LEVEL, TOTAL  URINE RAPID DRUG SCREEN (HOSP PERFORMED)   Imaging Review No results found.  MDM   1. Seizure disorder   2. Nausea and vomiting   3. Noncompliance with medication treatment due  to underuse of medication   4. History of Intracranial bleed   Pt H&P consistent with another episode of seizure, which at this point has become much more intractable over the past month.  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  As well, will get iStat chem 8, UA/UDS, Phenytoin/valproic acid levels, CBC w/ diff.  Possible discussion with neurology for further evaluation and management.    12:34 PM - Able to clear her C-Spine as pt meets Nexus criteria.  She does not have midline cervical tenderness, no neurologic deficits, no intoxication, no AMS, and no distracting injury.  Will not get CT neck as she does not meet dx criteria.   2:57 PM - Pt CT head was unchanged from previous showing no acute processes and her dilantin level was WNL, not showing toxicity.  As she is currently stable, she can be discharged with close neurology f/u with neurology at [facility], which she already has an appointment set up.    Twana First Paulina Fusi, DO of [facility] 04/27/2013, 3:00 PM       Briscoe Deutscher, DO 04/27/13 1500

## 2013-04-27 NOTE — ED Notes (Signed)
Pt taken out in wheelchair with her daughter who was driving her home. Pt to call her PCP and neurologist. Pt was a x 4 at time of discharge.

## 2013-04-27 NOTE — ED Notes (Signed)
Per EMS-Pt comes from home. Pt had at least 1 seizure, maybe 2, each time pt rolled off the bed onto the floor. After first seizure, pt was nauseated, pt had big tin bucket that she rolled onto after rolling off the bed. Pt has hx of seizures, typically does not get sick after seizures. Initially postictial for 20 mins. Pt is fully immobilized. Pt is alert now but is lethargic. C/o pain to mouth, but will not open mouth for EMS to see. Is spitting up blood, emesis multiple times. Given 8 mg zofran. BP 105/62, HR 84, RR 22, 98% RA, CBG 104.

## 2013-04-27 NOTE — ED Provider Notes (Signed)
I saw and evaluated the patient, reviewed the resident's note and I agree with the findings and plan.   Patient is a 57 y.o. female with a history of epilepsy on Dilantin and Vimpat and her Depakote was stopped on 04/20/13 who presents emergency Department seizure. Per EMS, patient's mother reports patient had 3 tonic-clonic seizures lasting for 1-2 minutes each while on the bed today. She was post ictal afterwards. During her last seizure, she fell off the bed and struck her head on a metal bucket that was put beside the bed for her emesis. No loss of consciousness. Patient has been vomiting since the last seizure. No numbness, tingling, focal weakness. She is on anticoagulation. She denies missing any of her antiepileptics. Denies any fever, cough, abdominal pain, chest pain or shortness of breath, diarrhea. Denies any drug or alcohol use. On exam, patient is actively vomiting, hemodynamically stable, neurologically intact, oriented x3. I have cleared her cervical spine clinically she is no midline tenderness, c-collar removed. Given patient head injury and repeated vomiting which is abnormal for her, will obtain head CT. Will obtain basic labs, urine, Dilantin and Depakote levels. Patient has neurology followup.   Labs unremarkable. Patient's Dilantin level is therapeutic. Depakote is subtherapeutic but the patient has recently stopped this medication per her neurologist's request. We'll discharge home. Instructed patient to followup with her neurologist.  Layla Maw Ward, DO 04/27/13 1455

## 2013-04-30 ENCOUNTER — Other Ambulatory Visit: Payer: Self-pay | Admitting: Neurology

## 2013-05-02 ENCOUNTER — Other Ambulatory Visit: Payer: Self-pay

## 2013-05-02 MED ORDER — LACOSAMIDE 200 MG PO TABS: 200.0000 mg | ORAL_TABLET | Freq: Two times a day (BID) | ORAL | Status: DC

## 2013-05-05 NOTE — Telephone Encounter (Signed)
Rx signed and faxed.

## 2013-06-05 ENCOUNTER — Emergency Department (HOSPITAL_COMMUNITY): Payer: Medicare Other

## 2013-06-05 ENCOUNTER — Emergency Department (HOSPITAL_COMMUNITY)
Admission: EM | Admit: 2013-06-05 | Discharge: 2013-06-05 | Disposition: A | Discharge status: Home / Self Care | Payer: Medicare Other | Attending: Emergency Medicine | Admitting: Emergency Medicine

## 2013-06-05 ENCOUNTER — Encounter (HOSPITAL_COMMUNITY): Payer: Self-pay | Admitting: Emergency Medicine

## 2013-06-05 DIAGNOSIS — R209 Unspecified disturbances of skin sensation: Secondary | ICD-10-CM | POA: Insufficient documentation

## 2013-06-05 DIAGNOSIS — F17200 Nicotine dependence, unspecified, uncomplicated: Secondary | ICD-10-CM | POA: Insufficient documentation

## 2013-06-05 DIAGNOSIS — M545 Low back pain, unspecified: Secondary | ICD-10-CM | POA: Insufficient documentation

## 2013-06-05 DIAGNOSIS — R569 Unspecified convulsions: Secondary | ICD-10-CM | POA: Insufficient documentation

## 2013-06-05 DIAGNOSIS — Z86718 Personal history of other venous thrombosis and embolism: Secondary | ICD-10-CM | POA: Insufficient documentation

## 2013-06-05 DIAGNOSIS — R42 Dizziness and giddiness: Secondary | ICD-10-CM | POA: Insufficient documentation

## 2013-06-05 DIAGNOSIS — Z8673 Personal history of transient ischemic attack (TIA), and cerebral infarction without residual deficits: Secondary | ICD-10-CM | POA: Insufficient documentation

## 2013-06-05 DIAGNOSIS — Z888 Allergy status to other drugs, medicaments and biological substances status: Secondary | ICD-10-CM | POA: Insufficient documentation

## 2013-06-05 DIAGNOSIS — G8929 Other chronic pain: Secondary | ICD-10-CM | POA: Insufficient documentation

## 2013-06-05 DIAGNOSIS — IMO0002 Reserved for concepts with insufficient information to code with codable children: Secondary | ICD-10-CM | POA: Insufficient documentation

## 2013-06-05 DIAGNOSIS — J45909 Unspecified asthma, uncomplicated: Secondary | ICD-10-CM | POA: Insufficient documentation

## 2013-06-05 DIAGNOSIS — R200 Anesthesia of skin: Secondary | ICD-10-CM

## 2013-06-05 DIAGNOSIS — Z885 Allergy status to narcotic agent status: Secondary | ICD-10-CM | POA: Insufficient documentation

## 2013-06-05 DIAGNOSIS — Z79899 Other long term (current) drug therapy: Secondary | ICD-10-CM | POA: Insufficient documentation

## 2013-06-05 LAB — CBC WITH DIFFERENTIAL/PLATELET
Basophils Absolute: 0 10*3/uL (ref 0.0–0.1)
Eosinophils Absolute: 0 10*3/uL (ref 0.0–0.7)
Eosinophils Relative: 1 % (ref 0–5)
HCT: 38.2 % (ref 36.0–46.0)
Lymphocytes Relative: 50 % — ABNORMAL HIGH (ref 12–46)
Lymphs Abs: 1.5 10*3/uL (ref 0.7–4.0)
MCH: 35.1 pg — ABNORMAL HIGH (ref 26.0–34.0)
MCV: 102.4 fL — ABNORMAL HIGH (ref 78.0–100.0)
Neutro Abs: 1.2 10*3/uL — ABNORMAL LOW (ref 1.7–7.7)
Neutrophils Relative %: 41 % — ABNORMAL LOW (ref 43–77)
Platelets: 130 10*3/uL — ABNORMAL LOW (ref 150–400)
RBC: 3.73 MIL/uL — ABNORMAL LOW (ref 3.87–5.11)
RDW: 12.3 % (ref 11.5–15.5)
WBC: 2.9 10*3/uL — ABNORMAL LOW (ref 4.0–10.5)

## 2013-06-05 LAB — BASIC METABOLIC PANEL
BUN: 9 mg/dL (ref 6–23)
CO2: 28 mEq/L (ref 19–32)
Calcium: 8.9 mg/dL (ref 8.4–10.5)
Chloride: 107 mEq/L (ref 96–112)
GFR calc Af Amer: >90 mL/min (ref >90)
Sodium: 142 mEq/L (ref 135–145)

## 2013-06-05 MED ORDER — KETOROLAC TROMETHAMINE 30 MG/ML IJ SOLN
30.0000 mg | Freq: Once | INTRAMUSCULAR | Status: AC
  Administered 2013-06-05: 30 mg via INTRAVENOUS
  Filled 2013-06-05: qty 1

## 2013-06-05 MED ORDER — SODIUM CHLORIDE 0.9 % IV BOLUS (SEPSIS)
1000.0000 mL | Freq: Once | INTRAVENOUS | Status: AC
  Administered 2013-06-05: 1000 mL via INTRAVENOUS

## 2013-06-05 NOTE — ED Provider Notes (Signed)
CSN: [id number]     Arrival date & time 06/05/13  1029 History  This chart was scribed for Enid Skeens, MD by Quintella Reichert, ED scribe.  This patient was seen in room A05C/A05C and the patient's care was started at 10:49 AM.   Chief Complaint  Patient presents with  . Leg Pain    The history is provided by the patient. No language interpreter was used.    HPI Comments: [name] is a 57 y.o. female with h/o DVT, cerebral hemorrhage, seizures, and chronic back pain ("2 bulging discs" per pt) who presents to the Emergency Department complaining of 3 days of intermittent episodes of sudden-onset bilateral leg numbness.  Pt states that her first episode occurred at a funeral 3 days ago.  She states her entire legs and feet become numb and "it feels like my legs are going to fold up."  She also notes a "burning pain" in her legs during these episodes.  She states that she sits down and the episodes then resolve within approximately 2 hours.  She states she also feels slightly light-headed presently.  She denies fevers, chills, abdominal pain, vomiting, SOB, new headaches, vision changes, or bowel or bladder symptoms.  Pt denies prior h/o similar symptoms.  She is fully ambulatory at baseline.   She has h/o blood clots in her legs but no vascular disease or stents.  She is not currently on anticoagulants.  She denies h/o DM.  Pt has chronic back pain and has been diagnosed with "2 bulging discs" but denies h/o back surgery.  Latest back x-ray was approximately one year ago.  She denies h/o IV drug use.  She uses Dilantin for seizures subsequent to cerebral hemorrhage.     Past Medical History  Diagnosis Date  . Seizures   . DVT (deep venous thrombosis)   . Asthma   . Cerebral hemorrhage   . Chronic back pain     Past Surgical History  Procedure Laterality Date  . Cesarean section    . Brain surgery      craniotomy with hematoma evacuation    Family History  Problem Relation  Age of Onset  . Heart attack Mother   . Heart attack Father     History  Substance Use Topics  . Smoking status: Current Every Day Smoker  . Smokeless tobacco: Never Used  . Alcohol Use: No    OB History   Grav Para Term Preterm Abortions TAB SAB Ect Mult Living                  Review of Systems A complete 10 system review of systems was obtained and all systems are negative except as noted in the HPI and PMH.    Allergies  Codeine; Keppra; and Olive oil  Home Medications   Current Outpatient Rx  Name  Route  Sig  Dispense  Refill  . acetaminophen (TYLENOL) 325 MG tablet   Oral   Take 650 mg by mouth every 6 (six) hours as needed for pain (headache).         Marland Kitchen albuterol (PROVENTIL HFA;VENTOLIN HFA) 108 (90 BASE) MCG/ACT inhaler   Inhalation   Inhale 2 puffs into the lungs every 4 (four) hours as needed for wheezing or shortness of breath.          . divalproex (DEPAKOTE) 500 MG DR tablet   Oral   Take 1 tablet (500 mg total) by mouth 2 (two) times daily.  60 tablet   2   . lacosamide (VIMPAT) 200 MG TABS tablet   Oral   Take 1 tablet (200 mg total) by mouth 2 (two) times daily.   60 tablet   1     Pharmacy Fax [phone]   . ondansetron (ZOFRAN ODT) 4 MG disintegrating tablet   Oral   Take 1 tablet (4 mg total) by mouth every 8 (eight) hours as needed for nausea.   20 tablet   0   . phenytoin (DILANTIN) 100 MG ER capsule      Take one tablet in morning and two tablets at night daily   90 capsule   2    BP 106/49  Pulse 62  Temp(Src) 98.3 F (36.8 C) (Oral)  Resp 16  SpO2 100%  Physical Exam  Nursing note and vitals reviewed. Constitutional: She is oriented to person, place, and time. She appears well-developed and well-nourished. No distress.  HENT:  Head: Normocephalic and atraumatic.  Eyes: EOM are normal. Pupils are equal, round, and reactive to light.  Neck: Neck supple. No tracheal deviation present.  Cardiovascular: Normal rate.    Good PT and DP pulses  Pulmonary/Chest: Effort normal. No respiratory distress.  Abdominal: Soft. There is no tenderness.  Musculoskeletal: Normal range of motion.  Mild tenderness to lower mid-back and paraspinal region  Neurological: She is alert and oriented to person, place, and time. No cranial nerve deficit.  Reflex Scores:      Patellar reflexes are 2+ on the right side and 2+ on the left side. No pronator drift No facial droop Sensation to sharp intact bialterally in all major nerves.  Decreased in dorsum of feet. Good patellar reflexes No clonus Normal strength to legs but quickly diminishes with time  Skin: Skin is warm and dry.  Psychiatric: She has a normal mood and affect. Her behavior is normal.    ED Course  Procedures (including critical care time)  DIAGNOSTIC STUDIES: Oxygen Saturation is 100% on room air, normal by my interpretation.    COORDINATION OF CARE: 10:59 AM-Discussed treatment plan which includes IV fluids, Toradol injection, lumbar spine MRI, and labs with pt at bedside and pt agreed to plan.    Labs Review Labs Reviewed  CBC WITH DIFFERENTIAL - Abnormal; Notable for the following:    WBC 2.9 (*)    RBC 3.73 (*)    MCV 102.4 (*)    MCH 35.1 (*)    Platelets 130 (*)    Neutrophils Relative % 41 (*)    Neutro Abs 1.2 (*)    Lymphocytes Relative 50 (*)    All other components within normal limits  BASIC METABOLIC PANEL    Imaging Review Mr [name]  06/05/2013   CLINICAL DATA:  Bilateral foot numbness for 3 days. History of seizures.  EXAM: MRI LUMBAR SPINE WITHOUT CONTRAST  TECHNIQUE: Multiplanar, multisequence MR imaging was performed. No intravenous contrast was administered.  COMPARISON:  Radiographs 07/23/2011. CT 05/20/2010.  FINDINGS: Prior studies demonstrate 5 lumbar-type vertebral bodies. The alignment is normal. There is no evidence of fracture or pars defect. There are scattered endplate degenerative changes and  Schmorl's nodes, similar to prior CT.  The conus medullaris extends to the L1-2 level and appears normal. No paraspinal abnormalities are identified. Dilatation of the thoracic duct in the right retrocrural area is unchanged from prior CT.  There is mild disc bulging with anterior osteophyte formation at T11-12.  There are no significant disc space  findings at T12-L1 or L1-2.  L2-3: Mild disc bulging and facet hypertrophy. No spinal stenosis or nerve root encroachment.  L3-4: Mild disc bulging with mild facet and ligamentous hypertrophy. There is minimal narrowing of the left lateral recess without definite nerve root encroachment. The foramina are patent.  L4-5: Mild disc bulging with mild facet and ligamentous hypertrophy. No significant spinal stenosis or nerve root encroachment.  L5-S1: Disc height and hydration are maintained. There is mild bilateral facet hypertrophy. No spinal stenosis or nerve root encroachment.  IMPRESSION: 1. No acute findings or explanation for the patient's symptoms. 2. Mild thoracolumbar degenerative changes with disc bulging and facet disease. No significant spinal stenosis or nerve root encroachment.   Electronically Signed   By: Roxy Horseman M.D.   On: 06/05/2013 13:16    EKG Interpretation   None       MDM   1. Bilateral leg numbness    I personally performed the services described in this documentation, which was scribed in my presence. The recorded information has been reviewed and is accurate.  Bilateral LE sxs and findings on exam.  GOod reflexes with good strength goes against GB. MRI to look for pathology, no acute findings. Pt improved in ED.  Discussed close outpt fup.   Leg numbness bilateral, Back pain  Enid Skeens, MD 06/12/13 1520

## 2013-06-05 NOTE — ED Notes (Signed)
Pt arrived from home by [facility] with c/o bilateral leg numbness and pain. Pt stated that she has a hx of back pain but has never had this problem before. Pt stated that when her legs go numb that she cannot move them. Stated that it comes and goes x 3 days. Hx of seizures, last seizure 2 weeks ago.  BP-119/60 HR-65 No fall or injury.

## 2013-06-09 ENCOUNTER — Encounter: Payer: Self-pay | Admitting: Neurology

## 2013-06-09 ENCOUNTER — Ambulatory Visit (INDEPENDENT_AMBULATORY_CARE_PROVIDER_SITE_OTHER): Payer: Medicare Other | Admitting: Neurology

## 2013-06-09 VITALS — BP 116/70 | HR 68 | Wt 120.0 lb

## 2013-06-09 DIAGNOSIS — R259 Unspecified abnormal involuntary movements: Secondary | ICD-10-CM

## 2013-06-09 DIAGNOSIS — R251 Tremor, unspecified: Secondary | ICD-10-CM

## 2013-06-09 DIAGNOSIS — R569 Unspecified convulsions: Secondary | ICD-10-CM

## 2013-06-09 DIAGNOSIS — Z5181 Encounter for therapeutic drug level monitoring: Secondary | ICD-10-CM

## 2013-06-09 DIAGNOSIS — R209 Unspecified disturbances of skin sensation: Secondary | ICD-10-CM

## 2013-06-09 NOTE — Progress Notes (Signed)
Reason for visit: Tremor, leg numbness  [name] is an 57 y.o. female  History of present illness:  [name] is a 57 year old right-handed white female with a history of intractable seizures. 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 The patient has been on Dilantin and Vimpat. The patient was taken off of Depakote approximately 2 months ago. The patient has been seen through [facility] for video EEG monitoring, and she is being evaluated for possible epilepsy surgery. The patient began developing episodes of bilateral leg numbness lasting 30 minutes to 3 or 4 hours going from the mid thigh level down to the feet. The episodes are associated with some gait instability, but the patient has not had any falls. The patient denies problems controlling the bowels or the bladder. Her usual seizures began with left arm numbness, and then with blacking out associated tongue biting and urinary incontinence. The last such seizure was within the last 2 weeks. The patient went to the emergency room for the leg numbness, and a lumbosacral spine MRI was done, but this did not show any etiology of her leg numbness. The patient has developed tremors as well affecting the head and neck, and the arms. This began around the same time that the leg numbness started. The patient comes to this office for further evaluation. The patient also reports headaches that started recently.  Past Medical History  Diagnosis Date  . Seizures   . DVT (deep venous thrombosis)   . Asthma   . Cerebral hemorrhage   . Chronic back pain     Past Surgical History  Procedure Laterality Date  . Cesarean section    . Brain surgery      craniotomy with hematoma evacuation    Family History  Problem Relation Age of Onset  .  Heart attack Mother   . Heart attack Father     Social history:  reports that she has been smoking.  She has never used smokeless tobacco. She reports that she does not drink alcohol or use illicit drugs.    Allergies  Allergen Reactions  . Codeine Nausea And Vomiting  . Keppra [Levetiracetam] Other (See Comments)    Causes seizures.   [name] Rash    Medications:  Current Outpatient Prescriptions on File Prior to Visit  Medication Sig Dispense Refill  . albuterol (PROVENTIL HFA;VENTOLIN HFA) 108 (90 BASE) MCG/ACT inhaler Inhale 2 puffs into the lungs every 4 (four) hours as needed for wheezing or shortness of breath.       . lacosamide (VIMPAT) 200 MG TABS tablet Take 1 tablet (200 mg total) by mouth 2 (two) times daily.  60 tablet  1  . phenytoin (DILANTIN) 100 MG ER capsule Take one tablet in morning and two tablets at night daily  90 capsule  2   No current facility-administered medications on file prior to visit.    ROS:  Out of a complete 14 system review of symptoms, the patient complains only of the following symptoms, and all other reviewed systems are negative.  Weight gain Feeling hot Confusion, headache, numbness, weakness, dizziness Anxiety  Blood pressure 116/70, pulse 68, weight 120 lb (54.432 kg).  Physical Exam  General: The patient is alert and cooperative at the time of the examination.  Skin: No significant peripheral  edema is noted.   Neurologic Exam  Mental status: The patient is oriented x 3.  Cranial nerves: Facial symmetry is present. Speech is normal, no aphasia or dysarthria is noted. Extraocular movements are full. Visual fields are notable for a dense left homonymous visual field deficit. A side-to-side head tremor is noted.  Motor: The patient has good strength in all 4 extremities.  Sensory examination: Soft touch sensation on the face is symmetric, slight decrease on the left arm and leg.  Coordination: The patient has good  finger-nose-finger and heel-to-shin bilaterally. No asterixis is seen.  Gait and station: The patient has a normal gait. Tandem gait is slightly unsteady. Romberg is negative. No drift is seen.  Reflexes: Deep tendon reflexes are symmetric.   Assessment/Plan:  1. Intractable seizures  2. Episodic bilateral leg numbness  3. Tremor, new onset  4. Headache  The patient appears to have tremor mainly affecting the head and neck, some tremor with the arms. The patient reports episodes of sensory alteration in both legs. The patient also has some numbness of the left arm. The patient has had onset of headache as well. The patient will be sent for MRI evaluation of the brain; a CT scan of brain was done towards the end of September 2014 after a seizure, with stable chronic changes. The patient will have blood work done today. It is possible that the patient may be having some increased stress or anxiety as an etiology for her current symptoms, but organic etiologies need to be considered. The use of clonazepam in the future may be considered. The patient will followup in 5 or 6 months.  Marlan Palau MD 06/09/2013 6:39 PM  [facility] [address] [place], [place] [address]  Phone [phone] Fax [phone]

## 2013-06-09 NOTE — Patient Instructions (Signed)
Seizure, Adult A seizure is abnormal electrical activity in the brain. Seizures can cause a change in attention or behavior (altered mental status). Seizures often involve uncontrollable shaking (convulsions). Seizures usually last from 30 seconds to 2 minutes. Epilepsy is a brain disorder in which a patient has repeated seizures over time. CAUSES  There are many different problems that can cause seizures. In some cases, no cause is found. Common causes of seizures include:  Head injuries.  Brain tumors.  Infections.  Imbalance of chemicals in the blood.  Kidney failure or liver failure.  Heart disease.  Drug abuse.  Stroke.  Withdrawal from certain drugs or alcohol.  Birth defects.  Malfunction of a neurosurgical device placed in the brain. SYMPTOMS  Symptoms vary depending on the part of the brain that is involved. Right before a seizure, you may have a warning (aura) that a seizure is about to occur. An aura may include the following symptoms:   Fear or anxiety.  Nausea.  Feeling like the room is spinning (vertigo).  Vision changes, such as seeing flashing lights or spots. Common symptoms during a seizure include:  Convulsions.  Drooling.  Rapid eye movements.  Grunting.  Loss of bladder and bowel control.  Bitter taste in the mouth. After a seizure, you may feel confused and sleepy. You may also have an injury resulting from convulsions during the seizure. DIAGNOSIS  Your caregiver will perform a physical exam and run some tests to determine the type and cause of your seizure. These tests may include:  Blood tests.  A lumbar puncture test. In this test, a small amount of fluid is removed from the spine and examined.  Electrocardiography (ECG). This test records the electrical activity in your heart.  Imaging tests, such as computed tomography (CT) scans or magnetic resonance imaging (MRI).  Electroencephalography (EEG). This test records the electrical  activity in your brain. TREATMENT  Seizures usually stop on their own. Treatment will depend on the cause of your seizure. In some cases, medicine may be given to prevent future seizures. HOME CARE INSTRUCTIONS   If you are given medicines, take them exactly as prescribed by your caregiver.  Keep all follow-up appointments as directed by your caregiver.  Do not swim or drive until your caregiver says it is okay.  Teach friends and family what to do if you have a seizure. They should:  Lay you on the ground to prevent a fall.  Put a cushion under your head.  Loosen any tight clothing around your neck.  Turn you on your side. If vomiting occurs, this helps keep your airway clear.  Stay with you until you recover. SEEK IMMEDIATE MEDICAL CARE IF:  The seizure lasts longer than 2 to 5 minutes.  The seizure is severe or the person does not wake up after the seizure.  The person has altered mental status. Drive the person to the emergency department or call your local emergency services (911 in [place] MAKE SURE YOU:  Understand these instructions.  Will watch your condition.  Will get help right away if you are not doing well or get worse. Document Released: 07/18/2000 Document Revised: 10/13/2011 Document Reviewed: 07/09/2011 [employer] Patient Information 2014 [employer].  

## 2013-06-10 ENCOUNTER — Telehealth: Payer: Self-pay | Admitting: Neurology

## 2013-06-10 ENCOUNTER — Other Ambulatory Visit (INDEPENDENT_AMBULATORY_CARE_PROVIDER_SITE_OTHER): Payer: Self-pay

## 2013-06-10 DIAGNOSIS — Z0289 Encounter for other administrative examinations: Secondary | ICD-10-CM

## 2013-06-10 NOTE — Telephone Encounter (Signed)
I just saw the patient yesterday for the same issue. We are getting blood work, MRI evaluation the brain. The patient appeared to have troubles since coming off of Depakote, it is possible that she may have worsening anxiety issues. If the workup is unremarkable, we may address treatment of anxiety.

## 2013-06-10 NOTE — Telephone Encounter (Signed)
Spoke with patient and she said that the shaking,numbness and headache started 4-5 days ago, seem as though she is [place] have a seizure, went to ER, MRI-was normal, blood work was good.  Could discontinuing the depracote be causing this?

## 2013-06-11 ENCOUNTER — Emergency Department (HOSPITAL_COMMUNITY): Payer: Medicare Other

## 2013-06-11 ENCOUNTER — Encounter (HOSPITAL_COMMUNITY): Payer: Self-pay | Admitting: Radiology

## 2013-06-11 ENCOUNTER — Observation Stay (HOSPITAL_COMMUNITY): Payer: Medicare Other

## 2013-06-11 ENCOUNTER — Inpatient Hospital Stay (HOSPITAL_COMMUNITY)
Admission: EM | Admit: 2013-06-11 | Discharge: 2013-06-13 | DRG: 101 | Disposition: A | Discharge status: Home / Self Care | Payer: Medicare Other | Attending: Internal Medicine | Admitting: Internal Medicine

## 2013-06-11 DIAGNOSIS — G40909 Epilepsy, unspecified, not intractable, without status epilepticus: Secondary | ICD-10-CM | POA: Diagnosis present

## 2013-06-11 DIAGNOSIS — Z86718 Personal history of other venous thrombosis and embolism: Secondary | ICD-10-CM

## 2013-06-11 DIAGNOSIS — G9389 Other specified disorders of brain: Secondary | ICD-10-CM | POA: Diagnosis present

## 2013-06-11 DIAGNOSIS — M51379 Other intervertebral disc degeneration, lumbosacral region without mention of lumbar back pain or lower extremity pain: Secondary | ICD-10-CM | POA: Diagnosis present

## 2013-06-11 DIAGNOSIS — H5340 Unspecified visual field defects: Secondary | ICD-10-CM | POA: Diagnosis present

## 2013-06-11 DIAGNOSIS — M5137 Other intervertebral disc degeneration, lumbosacral region: Secondary | ICD-10-CM | POA: Diagnosis present

## 2013-06-11 DIAGNOSIS — R209 Unspecified disturbances of skin sensation: Secondary | ICD-10-CM | POA: Diagnosis present

## 2013-06-11 DIAGNOSIS — I629 Nontraumatic intracranial hemorrhage, unspecified: Secondary | ICD-10-CM | POA: Diagnosis present

## 2013-06-11 DIAGNOSIS — Z8673 Personal history of transient ischemic attack (TIA), and cerebral infarction without residual deficits: Secondary | ICD-10-CM

## 2013-06-11 DIAGNOSIS — R569 Unspecified convulsions: Secondary | ICD-10-CM

## 2013-06-11 DIAGNOSIS — G40802 Other epilepsy, not intractable, without status epilepticus: Principal | ICD-10-CM | POA: Diagnosis present

## 2013-06-11 DIAGNOSIS — T420X1A Poisoning by hydantoin derivatives, accidental (unintentional), initial encounter: Secondary | ICD-10-CM

## 2013-06-11 DIAGNOSIS — D696 Thrombocytopenia, unspecified: Secondary | ICD-10-CM | POA: Diagnosis present

## 2013-06-11 DIAGNOSIS — R202 Paresthesia of skin: Secondary | ICD-10-CM | POA: Diagnosis present

## 2013-06-11 DIAGNOSIS — R259 Unspecified abnormal involuntary movements: Secondary | ICD-10-CM

## 2013-06-11 DIAGNOSIS — Z8774 Personal history of (corrected) congenital malformations of heart and circulatory system: Secondary | ICD-10-CM

## 2013-06-11 DIAGNOSIS — Z8249 Family history of ischemic heart disease and other diseases of the circulatory system: Secondary | ICD-10-CM

## 2013-06-11 DIAGNOSIS — F17200 Nicotine dependence, unspecified, uncomplicated: Secondary | ICD-10-CM | POA: Diagnosis present

## 2013-06-11 DIAGNOSIS — IMO0002 Reserved for concepts with insufficient information to code with codable children: Secondary | ICD-10-CM | POA: Diagnosis present

## 2013-06-11 DIAGNOSIS — R51 Headache: Secondary | ICD-10-CM | POA: Diagnosis present

## 2013-06-11 DIAGNOSIS — T420X5A Adverse effect of hydantoin derivatives, initial encounter: Secondary | ICD-10-CM | POA: Diagnosis present

## 2013-06-11 DIAGNOSIS — R251 Tremor, unspecified: Secondary | ICD-10-CM | POA: Diagnosis present

## 2013-06-11 HISTORY — DX: Reserved for concepts with insufficient information to code with codable children: IMO0002

## 2013-06-11 LAB — COMPREHENSIVE METABOLIC PANEL
AST: 15 U/L (ref 0–37)
Albumin: 3.7 g/dL (ref 3.5–5.2)
Alkaline Phosphatase: 58 U/L (ref 39–117)
CO2: 26 mEq/L (ref 19–32)
Chloride: 102 mEq/L (ref 96–112)
GFR calc non Af Amer: >90 mL/min (ref >90)
Potassium: 3.7 mEq/L (ref 3.5–5.1)
Sodium: 137 mEq/L (ref 135–145)
Total Bilirubin: 0.1 mg/dL — ABNORMAL LOW (ref 0.3–1.2)

## 2013-06-11 LAB — CBC WITH DIFFERENTIAL/PLATELET
Basophils Absolute: 0 10*3/uL (ref 0.0–0.1)
Basophils Relative: 0 % (ref 0–1)
Hemoglobin: 12.9 g/dL (ref 12.0–15.0)
Lymphocytes Relative: 40 % (ref 12–46)
MCHC: 33.3 g/dL (ref 30.0–36.0)
Monocytes Relative: 8 % (ref 3–12)
Neutro Abs: 2 10*3/uL (ref 1.7–7.7)
Neutrophils Relative %: 51 % (ref 43–77)
RDW: 12.5 % (ref 11.5–15.5)
WBC: 4 10*3/uL (ref 4.0–10.5)

## 2013-06-11 LAB — URINALYSIS W MICROSCOPIC + REFLEX CULTURE
Bilirubin Urine: NEGATIVE
Glucose, UA: NEGATIVE mg/dL
Hgb urine dipstick: NEGATIVE
Ketones, ur: NEGATIVE mg/dL
Nitrite: NEGATIVE
Protein, ur: NEGATIVE mg/dL
Urobilinogen, UA: 0.2 mg/dL (ref 0.0–1.0)

## 2013-06-11 LAB — RAPID URINE DRUG SCREEN, HOSP PERFORMED
Amphetamines: NOT DETECTED
Barbiturates: NOT DETECTED
Benzodiazepines: NOT DETECTED
Tetrahydrocannabinol: NOT DETECTED

## 2013-06-11 LAB — PHENYTOIN LEVEL, TOTAL: Phenytoin Lvl: 27.1 ug/mL — ABNORMAL HIGH (ref 10.0–20.0)

## 2013-06-11 LAB — MRSA PCR SCREENING: MRSA by PCR: NEGATIVE

## 2013-06-11 LAB — MAGNESIUM: Magnesium: 1.9 mg/dL (ref 1.5–2.5)

## 2013-06-11 MED ORDER — ONDANSETRON HCL 4 MG/2ML IJ SOLN: 4.0000 mg | Freq: Four times a day (QID) | INTRAMUSCULAR | Status: DC | PRN

## 2013-06-11 MED ORDER — ALBUTEROL SULFATE HFA 108 (90 BASE) MCG/ACT IN AERS: 2.0000 | INHALATION_SPRAY | RESPIRATORY_TRACT | Status: DC | PRN

## 2013-06-11 MED ORDER — ACETAMINOPHEN 650 MG RE SUPP: 650.0000 mg | Freq: Four times a day (QID) | RECTAL | Status: DC | PRN

## 2013-06-11 MED ORDER — ALUM & MAG HYDROXIDE-SIMETH 200-200-20 MG/5ML PO SUSP: 30.0000 mL | Freq: Four times a day (QID) | ORAL | Status: DC | PRN

## 2013-06-11 MED ORDER — ONDANSETRON HCL 4 MG PO TABS: 4.0000 mg | ORAL_TABLET | Freq: Four times a day (QID) | ORAL | Status: DC | PRN

## 2013-06-11 MED ORDER — ACETAMINOPHEN 325 MG PO TABS
650.0000 mg | ORAL_TABLET | Freq: Four times a day (QID) | ORAL | Status: DC | PRN
  Administered 2013-06-13: 650 mg via ORAL
  Filled 2013-06-11: qty 2

## 2013-06-11 MED ORDER — SODIUM CHLORIDE 0.9 % IJ SOLN
3.0000 mL | Freq: Two times a day (BID) | INTRAMUSCULAR | Status: DC
  Administered 2013-06-11: 3 mL via INTRAVENOUS

## 2013-06-11 MED ORDER — ENOXAPARIN SODIUM 40 MG/0.4ML ~~LOC~~ SOLN
40.0000 mg | SUBCUTANEOUS | Status: DC
  Administered 2013-06-11 – 2013-06-12 (×2): 40 mg via SUBCUTANEOUS
  Filled 2013-06-11 (×3): qty 0.4

## 2013-06-11 MED ORDER — LORAZEPAM 2 MG/ML IJ SOLN
1.0000 mg | INTRAMUSCULAR | Status: DC | PRN
  Administered 2013-06-12: 1 mg via INTRAVENOUS
  Filled 2013-06-11: qty 1

## 2013-06-11 MED ORDER — ALBUTEROL SULFATE (5 MG/ML) 0.5% IN NEBU: 2.5000 mg | INHALATION_SOLUTION | RESPIRATORY_TRACT | Status: DC | PRN

## 2013-06-11 MED ORDER — SODIUM CHLORIDE 0.9 % IV SOLN
INTRAVENOUS | Status: DC
  Administered 2013-06-11 – 2013-06-13 (×4): via INTRAVENOUS

## 2013-06-11 MED ORDER — GADOBENATE DIMEGLUMINE 529 MG/ML IV SOLN
10.0000 mL | Freq: Once | INTRAVENOUS | Status: AC | PRN
  Administered 2013-06-11: 10 mL via INTRAVENOUS

## 2013-06-11 MED ORDER — LACOSAMIDE 50 MG PO TABS
200.0000 mg | ORAL_TABLET | Freq: Two times a day (BID) | ORAL | Status: DC
  Administered 2013-06-11 – 2013-06-13 (×4): 200 mg via ORAL
  Filled 2013-06-11 (×4): qty 4

## 2013-06-11 NOTE — H&P (Signed)
PATIENT DETAILS Name: [name] Age: 57 y.o. Sex: female Date of Birth: Apr 10, 1956 Admit Date: 06/11/2013 PCP:KIM, Fayrene Fearing, MD   CHIEF COMPLAINT:  Seizures  HPI: [name] is a 57 y.o. 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 Apparently, patient had 2 witnessed seizures today, as a result she was brought to the emergency room. Patient claims she has been on multiple seizure medications in the past including Depakote, Keppra, phenobarbital, Tegretol. None of these medications have controlled his seizures. Patient claims that she is very compliant with his medications. She has good followup with Dr. Anne Hahn of [facility] neurology, and claims that she also sees a neurologist in [place], and is being currently evaluated to see if she is a candidate for epilepsy surgery. In any event, for the past 3 weeks she claims that she is had tremors that are generalized. She also claims to have had tingling and numbness of her feet, she had a MRI of the lumbosacral spine recently which did not show any major abnormalities. Persistent and recurrent seizures, I was asked to admit this patient for further evaluation and treatment. Neurology has already seen the patient here in the emergency room, I have spoken with the neurologist Dr. Cyril Mourning, current recommendations only for observation, and MRI of the brain was she is admitted here in the hospital. Dr. Cyril Mourning suggests keeping the patient here and [facility] for now, he sees no reason for the patient to be transferred to [facility]. During my evaluation, patient was having generalized tremors including of the head, but otherwise she was awake and alert, she denied any headache, chest pain, shortness of breath, nausea, vomiting or abdominal pain.   ALLERGIES:   Allergies  Allergen  Reactions  . Codeine Nausea And Vomiting  . Keppra [Levetiracetam] Other (See Comments)    Causes seizures.   [name] Rash    PAST MEDICAL HISTORY: Past Medical History  Diagnosis Date  . Seizures   . DVT (deep venous thrombosis)   . Asthma   . Cerebral hemorrhage   . Chronic back pain     PAST SURGICAL HISTORY: Past Surgical History  Procedure Laterality Date  . Cesarean section    . Brain surgery      craniotomy with hematoma evacuation    MEDICATIONS AT HOME: Prior to Admission medications   Medication Sig Start Date End Date Taking? Authorizing Provider  albuterol (PROVENTIL HFA;VENTOLIN HFA) 108 (90 BASE) MCG/ACT inhaler Inhale 2 puffs into the lungs every 4 (four) hours as needed for wheezing or shortness of breath.    Yes Historical Provider, MD  lacosamide (VIMPAT) 200 MG TABS tablet Take 1 tablet (200 mg total) by mouth 2 (two) times daily. 05/02/13  Yes Suanne Marker, MD  phenytoin (DILANTIN) 100 MG ER capsule Take one tablet in morning and two tablets at night daily 04/15/13  Yes Meredeth Ide, MD    FAMILY HISTORY: Family History  Problem Relation Age of Onset  . Heart attack Mother   . Heart attack Father     SOCIAL HISTORY:  reports that she has been smoking.  She has never used smokeless tobacco. She reports that she does not drink alcohol or use illicit drugs.  REVIEW OF SYSTEMS:  Constitutional:   No  weight loss, night sweats,  Fevers, chills, fatigue.  HEENT:  No headaches, Difficulty swallowing,Tooth/dental problems,Sore throat,  No sneezing, itching, ear ache, nasal congestion, post nasal drip,   Cardio-vascular: No chest pain,  Orthopnea, PND, swelling in lower extremities, anasarca, dizziness, palpitations  GI:  No heartburn, indigestion, abdominal pain, nausea, vomiting, diarrhea, change in bowel habits, loss of appetite  Resp: No shortness of breath with exertion or at rest.  No excess mucus, no productive cough, No  non-productive cough,  No coughing up of blood.No change in color of mucus.No wheezing.No chest wall deformity  Skin:  no rash or lesions.  GU:  no dysuria, change in color of urine, no urgency or frequency.  No flank pain.  Musculoskeletal: No joint pain or swelling.  No decreased range of motion.  No back pain.  Psych: No change in mood or affect. No depression or anxiety.  No memory loss.   PHYSICAL EXAM: Blood pressure 102/45, pulse 64, temperature 98.1 F (36.7 C), temperature source Oral, resp. rate 14, SpO2 95.00%.  General appearance :Awake, alert, not in any distress. Speech Clear. Not toxic Looking HEENT: Atraumatic and Normocephalic, pupils equally reactive to light and accomodation Neck: supple, no JVD. No cervical lymphadenopathy.  Chest:Good air entry bilaterally, no added sounds  CVS: S1 S2 regular, no murmurs.  Abdomen: Bowel sounds present, Non tender and not distended with no gaurding, rigidity or rebound. Extremities: B/L Lower Ext shows no edema, both legs are warm to touch Neurology: Awake alert, and oriented X 3,  Non focal Skin:No Rash Wounds:N/A  LABS ON ADMISSION:   Recent Labs  06/11/13 1530  NA 137  K 3.7  CL 102  CO2 26  GLUCOSE 119*  BUN 13  CREATININE 0.67  CALCIUM 9.2  MG 1.9    Recent Labs  06/11/13 1530  AST 15  ALT 10  ALKPHOS 58  BILITOT 0.1*  PROT 6.3  ALBUMIN 3.7   No results found for this basename: LIPASE, AMYLASE,  in the last 72 hours  Recent Labs  06/11/13 1530  WBC 4.0  NEUTROABS 2.0  HGB 12.9  HCT 38.7  MCV 103.5*  PLT 129*   No results found for this basename: CKTOTAL, CKMB, CKMBINDEX, TROPONINI,  in the last 72 hours No results found for this basename: DDIMER,  in the last 72 hours No components found with this basename: POCBNP,    RADIOLOGIC STUDIES ON ADMISSION: Ct Head Wo Contrast  06/11/2013   CLINICAL DATA:  Seizure history, history of brain surgery, complaining of upper body tremors  EXAM:  CT HEAD WITHOUT CONTRAST  TECHNIQUE: Contiguous axial images were obtained from the base of the skull through the vertex without intravenous contrast.  COMPARISON:  04/27/2013  FINDINGS: Status post right craniotomy. Large area of encephalomalacia right parieto-occipital lobe, unchanged. No hemorrhage or acute infarct. No extra-axial fluid. Stable ex vacuo dilatation right ventricle occipital horn.  IMPRESSION: No change when compared to prior study. No acute findings.   Electronically Signed   By: Esperanza Heir M.D.   On: 06/11/2013 15:57    ASSESSMENT AND PLAN: Present on Admission:  . Seizure disorder with breakthrough seizures  - Spoke with neurology-Dr. Estil Daft need to change antiepileptic medication, use as needed Ativan for breakthrough seizures. He suggest getting a MRI of the brain. Since the Dilantin levels a supratherapeutic, will hold Dilantin, recheck level in a.m. and have pharmacy to dose while the patient is here in the hospital.   . Tremor ? Etiology, could be from Dilantin toxicity however this has been going  on for 3 weeks.  - Neurology recommending MRI of the brain, would check a vitamin B12 level.  - Follow clinical course and await recommendations from neurology.   . Dilantin toxicity  - Hold Dilantin, monitor in telemetry. Check Dilantin level in a.m. We'll have pharmacy dose of Dilantin once levels are acceptable.   Marland Kitchen History of Intracranial bleed - Secondary to AVM-has had seizures since then-this occurred at age 44   Further plan will depend as patient's clinical course evolves and further radiologic and laboratory data become available. Patient will be monitored closely.   DVT Prophylaxis: Prophylactic Lovenox  Code Status: Full Code  Total time spent for admission equals 45 minutes.  [facility] Pager [phone]  If 7PM-7AM, please contact night-coverage [email or web address] Password [email or web address] 06/11/2013, 7:05 PM

## 2013-06-11 NOTE — ED Notes (Addendum)
Per EMS patient with seizure Hx s/t brain surgery comes from home to ED for upper body tremors, patient is alert, oriented, able to converse and ambulate.  On assessment patient exhibits visible tremors throughout upper body.

## 2013-06-11 NOTE — Progress Notes (Addendum)
NEURO HOSPITALIST CONSULT NOTE    Reason for Consult: seizures  HPI:                                                                                                                                          [name] is an 57 y.o. 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 She is currently on dilantin and vimpat 400 mg daily and said that she had failed or had side effects to multiple anticonvulsants including Topamax, Depakote, keppra, phenobarbital, tegretol, and perhaps Lamictal. She indicated that many of these medications actually increase her seizure frequency. In addition, she expressed that she is undergoing extensive testing at [facility] for possible epilepsy surgery. [name] stated that she is very compliant with her anti-seizure medications. Importantly, she reports that for the past couple of weeks she has been having daly, off and on " shakiness and tremors" of the whole body. She was seen her neurologist Dr. Anne Hahn yesterday and brain MRI was requested. Complains of mild HA lately and imbalance ut denies vertigo, double vision, difficulty swallowing focal weakness, slurred speech, or language impairment. 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 Dilantin level 27.1 today. At this moment, her mental status is back to baseline.    Past Medical History  Diagnosis Date  . Seizures   . DVT (deep venous thrombosis)   . Asthma   . Cerebral hemorrhage   . Chronic back pain     Past Surgical History  Procedure Laterality Date  . Cesarean section    . Brain surgery      craniotomy with hematoma evacuation    Family History  Problem Relation Age of Onset  . Heart attack Mother   . Heart attack Father       Social History:  reports that she has been smoking.  She has never used smokeless tobacco. She reports that she does not drink alcohol or use illicit drugs.  Allergies  Allergen Reactions  . Codeine Nausea And Vomiting  . Keppra [Levetiracetam] Other (See Comments)    Causes seizures.   [name] Rash    MEDICATIONS:  I have reviewed the patient's current medications.   ROS:                                                                                                                                       History obtained from the patient and chart review.  General ROS: negative for - chills, fatigue, fever, night sweats, weight gain or weight loss Psychological ROS: negative for - behavioral disorder, hallucinations, memory difficulties, mood swings or suicidal ideation Ophthalmic ROS: negative for - blurry vision, double vision, eye pain ENT ROS: negative for - epistaxis, nasal discharge, oral lesions, sore throat, tinnitus or vertigo Allergy and Immunology ROS: negative for - hives or itchy/watery eyes Hematological and Lymphatic ROS: negative for - bleeding problems, bruising or swollen lymph nodes Endocrine ROS: negative for - galactorrhea, hair pattern changes, polydipsia/polyuria or temperature intolerance Respiratory ROS: negative for - cough, hemoptysis, shortness of breath or wheezing Cardiovascular ROS: negative for - chest pain, dyspnea on exertion, edema or irregular heartbeat Gastrointestinal ROS: negative for - abdominal pain, diarrhea, hematemesis, nausea/vomiting or stool incontinence Genito-Urinary ROS: negative for - dysuria, hematuria, incontinence or urinary frequency/urgency Musculoskeletal ROS: negative for - joint swelling or muscular weakness Neurological ROS: as noted in HPI Dermatological ROS: negative for rash and skin lesion  changes   Physical exam: pleasant female in no apparent distress. Blood pressure 101/54, pulse 68, temperature 98.6 F (37 C), temperature source Oral, resp. rate 18, SpO2 99.00%. Head: normocephalic. Neck: supple, no bruits, no JVD. Cardiac: no murmurs. Lungs: clear. Abdomen: soft, no tender, no mass. Extremities: no edema.  Neurologic Examination:                                                                                                      Mental Status: Alert, oriented, thought content appropriate.  Speech fluent without evidence of aphasia.  Able to follow 3 step commands without difficulty. Cranial Nerves: II: Discs flat bilaterally; left visual field defect, pupils equal, round, reactive to light and accommodation III,IV, VI: ptosis not present, extra-ocular motions intact bilaterally V,VII: smile symmetric, facial light touch sensation normal bilaterally VIII: hearing normal bilaterally IX,X: gag reflex present XI: bilateral shoulder shrug XII: midline tongue extension without atrophy or fasciculations  Motor: Right : Upper extremity   5/5    Left:     Upper extremity   5/5  Lower extremity   5/5     Lower extremity   5/5 Tone and bulk:normal tone throughout; no atrophy  noted Sensory: Pinprick and light touch intact throughout, bilaterally Deep Tendon Reflexes:  Right: Upper Extremity   Left: Upper extremity   biceps (C-5 to C-6) 2/4   biceps (C-5 to C-6) 2/4 tricep (C7) 2/4    triceps (C7) 2/4 Brachioradialis (C6) 2/4  Brachioradialis (C6) 2/4  Lower Extremity Lower Extremity  quadriceps (L-2 to L-4) 2/4   quadriceps (L-2 to L-4) 2/4 Achilles (S1) 2/4   Achilles (S1) 2/4  Plantars: Right: downgoing   Left: downgoing Cerebellar: Postural tremor and also head tremor but normal finger-to-nose,  normal heel-to-shin test Gait:  No tested. CV: pulses palpable throughout    No results found for this basename: cbc, bmp, coags, chol, tri, ldl, hga1c     Results for orders placed during the hospital encounter of 06/11/13 (from the past 48 hour(s))  URINALYSIS W MICROSCOPIC + REFLEX CULTURE     Status: None   Collection Time    06/11/13  3:21 PM      Result Value Range   Color, Urine YELLOW  YELLOW   APPearance CLEAR  CLEAR   Specific Gravity, Urine 1.014  1.005 - 1.030   pH 7.0  5.0 - 8.0   Glucose, UA NEGATIVE  NEGATIVE mg/dL   Hgb urine dipstick NEGATIVE  NEGATIVE   Bilirubin Urine NEGATIVE  NEGATIVE   Ketones, ur NEGATIVE  NEGATIVE mg/dL   Protein, ur NEGATIVE  NEGATIVE mg/dL   Urobilinogen, UA 0.2  0.0 - 1.0 mg/dL   Nitrite NEGATIVE  NEGATIVE   Leukocytes, UA NEGATIVE  NEGATIVE   Squamous Epithelial / LPF RARE  RARE  URINE RAPID DRUG SCREEN (HOSP PERFORMED)     Status: None   Collection Time    06/11/13  3:21 PM      Result Value Range   Opiates NONE DETECTED  NONE DETECTED   Cocaine NONE DETECTED  NONE DETECTED   Benzodiazepines NONE DETECTED  NONE DETECTED   Amphetamines NONE DETECTED  NONE DETECTED   Tetrahydrocannabinol NONE DETECTED  NONE DETECTED   Barbiturates NONE DETECTED  NONE DETECTED   Comment:            DRUG SCREEN FOR MEDICAL PURPOSES     ONLY.  IF CONFIRMATION IS NEEDED     FOR ANY PURPOSE, NOTIFY LAB     WITHIN 5 DAYS.                LOWEST DETECTABLE LIMITS     FOR URINE DRUG SCREEN     Drug Class       Cutoff (ng/mL)     Amphetamine      1000     Barbiturate      200     Benzodiazepine   200     Tricyclics       300     Opiates          300     Cocaine          300     THC              50  CBC WITH DIFFERENTIAL     Status: Abnormal   Collection Time    06/11/13  3:30 PM      Result Value Range   WBC 4.0  4.0 - 10.5 K/uL   RBC 3.74 (*) 3.87 - 5.11 MIL/uL   Hemoglobin 12.9  12.0 - 15.0 g/dL   HCT 16.1  09.6 - 04.5 %   MCV  103.5 (*) 78.0 - 100.0 fL   MCH 34.5 (*) 26.0 - 34.0 pg   MCHC 33.3  30.0 - 36.0 g/dL   RDW 16.1  09.6 - 04.5 %   Platelets 129 (*) 150 - 400 K/uL   Neutrophils  Relative % 51  43 - 77 %   Neutro Abs 2.0  1.7 - 7.7 K/uL   Lymphocytes Relative 40  12 - 46 %   Lymphs Abs 1.6  0.7 - 4.0 K/uL   Monocytes Relative 8  3 - 12 %   Monocytes Absolute 0.3  0.1 - 1.0 K/uL   Eosinophils Relative 1  0 - 5 %   Eosinophils Absolute 0.1  0.0 - 0.7 K/uL   Basophils Relative 0  0 - 1 %   Basophils Absolute 0.0  0.0 - 0.1 K/uL  COMPREHENSIVE METABOLIC PANEL     Status: Abnormal   Collection Time    06/11/13  3:30 PM      Result Value Range   Sodium 137  135 - 145 mEq/L   Potassium 3.7  3.5 - 5.1 mEq/L   Chloride 102  96 - 112 mEq/L   CO2 26  19 - 32 mEq/L   Glucose, Bld 119 (*) 70 - 99 mg/dL   BUN 13  6 - 23 mg/dL   Creatinine, Ser 4.09  0.50 - 1.10 mg/dL   Calcium 9.2  8.4 - 81.1 mg/dL   Total Protein 6.3  6.0 - 8.3 g/dL   Albumin 3.7  3.5 - 5.2 g/dL   AST 15  0 - 37 U/L   ALT 10  0 - 35 U/L   Alkaline Phosphatase 58  39 - 117 U/L   Total Bilirubin 0.1 (*) 0.3 - 1.2 mg/dL   GFR calc non Af Amer >90  >90 mL/min   GFR calc Af Amer >90  >90 mL/min   Comment: (NOTE)     The eGFR has been calculated using the CKD EPI equation.     This calculation has not been validated in all clinical situations.     eGFR's persistently <90 mL/min signify possible Chronic Kidney     Disease.  PHENYTOIN LEVEL, TOTAL     Status: Abnormal   Collection Time    06/11/13  3:30 PM      Result Value Range   Phenytoin Lvl 27.1 (*) 10.0 - 20.0 ug/mL   Comment: Performed at [facility]    Ct Head Wo Contrast  06/11/2013   CLINICAL DATA:  Seizure history, history of brain surgery, complaining of upper body tremors  EXAM: CT HEAD WITHOUT CONTRAST  TECHNIQUE: Contiguous axial images were obtained from the base of the skull through the vertex without intravenous contrast.  COMPARISON:  04/27/2013  FINDINGS: Status post right craniotomy. Large area of encephalomalacia right parieto-occipital lobe, unchanged. No hemorrhage or acute infarct. No extra-axial fluid. Stable ex vacuo  dilatation right ventricle occipital horn.  IMPRESSION: No change when compared to prior study. No acute findings.   Electronically Signed   By: Esperanza Heir M.D.   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 Now back to baseline. Supra therapeutic dilantin level: will hold dilantin until level becomes 16-18.  Check daily dilantin level. Continue vimpat. Will not add another anticonvulsant as she had failed or had side effects to multiple drugs and some of them apparently cause  her to have increase seizure frequency. Ativan PRN. Although cases of acute movement disorders associated with dilantin toxicity and anticonvulsants in general have been reported, I doubt that this is the case as her " shakiness and tremors" have been present for 3 weeks. Agree with MRI brain to try to find a structural cause for her new onset abnormal movements. Will follow up.     Wyatt Portela, MD [facility]  [phone]  06/11/2013, 6:27 PM

## 2013-06-11 NOTE — ED Provider Notes (Signed)
CSN: [id number]     Arrival date & time 06/11/13  1410 History   First MD Initiated Contact with Patient 06/11/13 1501     Chief Complaint  Patient presents with  . Tremors   (Consider location/radiation/quality/duration/timing/severity/associated sxs/prior Treatment) HPI [name] is a 57 y.o. female who presents emergency department complaining of seizures and tremors. History is provided by patient and her family. According to the family patient had 2 grand mal seizures today while in her recliner each lasting approximately a minute 15 minutes apart. Patient was unconscious and having tonic-clonic movements, no loss of urine or tongue biting reported. Patient does have history of seizures and has had recent admissions for the same. Patient also admits to being weak, having involuntary tremors, and just "not feeling like myself" for the last week. She was seen by Dr. Anne Hahn 2 days ago who ordered an MRI of her brain and blood work to be done this coming week. 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 Patient denies any other recent illnesses, denies any fever, chills, head injuries. She states that she has had intermittent headache. Denies any urinary symptoms. States Dr. Stann Mainland didn't take her off of Depakote about 3 weeks ago and states she thinks maybe her symptoms are due to that. Also admits to recent increased anxiety and stress. Past Medical History  Diagnosis Date  . Seizures   . DVT (deep venous thrombosis)   . Asthma   . Cerebral hemorrhage   . Chronic back pain    Past Surgical History  Procedure Laterality Date  . Cesarean section    . Brain surgery      craniotomy with hematoma evacuation   Family History  Problem Relation Age of Onset  . Heart attack Mother   . Heart attack Father    History  Substance Use Topics  . Smoking status: Current Every Day Smoker  . Smokeless tobacco: Never Used  . Alcohol Use: No    OB History   Grav Para Term Preterm Abortions TAB SAB Ect Mult Living                 Review of Systems  Constitutional: Positive for fatigue. Negative for fever and chills.  Respiratory: Negative for cough, chest tightness and shortness of breath.   Cardiovascular: Negative for chest pain, palpitations and leg swelling.  Gastrointestinal: Negative for nausea, vomiting, abdominal pain and diarrhea.  Genitourinary: Negative for dysuria, flank pain, vaginal bleeding, vaginal discharge, vaginal pain and pelvic pain.  Musculoskeletal: Negative for arthralgias, myalgias, neck pain and neck stiffness.  Skin: Negative for rash.  Neurological: Positive for tremors, seizures, weakness and numbness. Negative for dizziness and headaches.  All other systems reviewed and are negative.    Allergies  Codeine; Keppra; and Olive oil  Home Medications   Current Outpatient Rx  Name  Route  Sig  Dispense  Refill  . albuterol (PROVENTIL HFA;VENTOLIN HFA) 108 (90 BASE) MCG/ACT inhaler   Inhalation   Inhale 2 puffs into the lungs every 4 (four) hours as needed for wheezing or shortness of breath.          . lacosamide (VIMPAT) 200 MG TABS tablet   Oral   Take 1 tablet (200 mg total) by mouth 2 (two) times daily.   60 tablet   1     Pharmacy Fax [phone]   . phenytoin (DILANTIN) 100 MG ER capsule  Take one tablet in morning and two tablets at night daily   90 capsule   2    BP 101/54  Temp(Src) 98.6 F (37 C) (Oral)  Resp 18  SpO2 99% Physical Exam  Nursing note and vitals reviewed. Constitutional: She is oriented to person, place, and time. She appears well-developed and well-nourished. No distress.  Eyes: Conjunctivae are normal.  Neck: Neck supple.  Cardiovascular: Normal rate, regular rhythm and normal heart sounds.   Pulmonary/Chest: Effort normal and breath sounds normal. No respiratory distress. She has no wheezes. She has no rales.  Abdominal: Soft. Bowel sounds are  normal. She exhibits no distension. There is no tenderness. There is no rebound.  Neurological: She is alert and oriented to person, place, and time. No cranial nerve deficit. Coordination normal.  5/5 and equal upper and lower extremity strength bilaterally. Equal grip strength bilaterally. Normal finger to nose and heel to shin. No pronator drift. Patellar reflexes 2+   Skin: Skin is warm and dry.    ED Course  Procedures (including critical care time) Labs Review Labs Reviewed  CBC WITH DIFFERENTIAL - Abnormal; Notable for the following:    RBC 3.74 (*)    MCV 103.5 (*)    MCH 34.5 (*)    Platelets 129 (*)    All other components within normal limits  COMPREHENSIVE METABOLIC PANEL - Abnormal; Notable for the following:    Glucose, Bld 119 (*)    Total Bilirubin 0.1 (*)    All other components within normal limits  PHENYTOIN LEVEL, TOTAL - Abnormal; Notable for the following:    Phenytoin Lvl 27.1 (*)    All other components within normal limits  MRSA PCR SCREENING  URINALYSIS W MICROSCOPIC + REFLEX CULTURE  URINE RAPID DRUG SCREEN (HOSP PERFORMED)  MAGNESIUM  PHENYTOIN LEVEL, TOTAL  CBC  BASIC METABOLIC PANEL  HEMOGLOBIN A1C  VITAMIN B12  PHENYTOIN LEVEL, TOTAL   Imaging Review Ct Head Wo Contrast  06/11/2013   CLINICAL DATA:  Seizure history, history of brain surgery, complaining of upper body tremors  EXAM: CT HEAD WITHOUT CONTRAST  TECHNIQUE: Contiguous axial images were obtained from the base of the skull through the vertex without intravenous contrast.  COMPARISON:  04/27/2013  FINDINGS: Status post right craniotomy. Large area of encephalomalacia right parieto-occipital lobe, unchanged. No hemorrhage or acute infarct. No extra-axial fluid. Stable ex vacuo dilatation right ventricle occipital horn.  IMPRESSION: No change when compared to prior study. No acute findings.   Electronically Signed   By: Esperanza Heir M.D.   On: 06/11/2013 15:57   Mr [name] ZO  Contrast  06/11/2013   CLINICAL DATA:  History of recurrent generalized tonic-clonic seizures, with persistent seizures today. History of hemorrhage and subsequent surgery for AVM.  EXAM: MRI HEAD WITHOUT AND WITH CONTRAST  TECHNIQUE: Multiplanar, multiecho pulse sequences of the brain and surrounding structures were obtained without and with intravenous contrast.  COMPARISON:  CT head earlier today. Most recent MR brain 08/16/2010. Also prior CT scan 04/10/2004.  FINDINGS: No restricted diffusion to suggest acute stroke or visible seizure focus. Extensive right parieto-occipital encephalomalacia representing prior hemorrhage and postsurgical change, unchanged from priors.  Minimal cerebral and cerebellar atrophy. Mild subcortical and periventricular T2 and FLAIR hyperintensities, likely chronic microvascular ischemic change. No mass lesion, hydrocephalus, or extra-axial fluid.  Chronic hemorrhage at the surgical site. View flow voids are maintained in the carotid, basilar, and vertebral arteries.  Post infusion, no abnormal enhancement of the brain  or meninges. Mild dural enhancement at the surgical site is postoperative in nature. No visible residual enhancing vascular nidus. No evidence for venous sinus thrombosis.  Normal pituitary, cerebellar tonsils, and upper cervical region. No osseous lesions. Negative orbits. No acute sinus disease. Mild left mastoid fluid similar to priors.  IMPRESSION: Extensive right parieto-occipital encephalomalacia representing prior hemorrhage and postsurgical change. No acute intracranial findings. Similar appearance to prior CT and MR.   Electronically Signed   By: Davonna Belling M.D.   On: 06/11/2013 21:10    EKG Interpretation   None       MDM   1. Tremor   2. Dilantin toxicity, initial encounter   3. Seizures     Patient with history of seizures, with no tremors onset 3 days ago. She is followed by Dr. Anne Hahn. She's here today after 2 seizures 15 minutes apart  each lasting approximately a minute. She is here alert and in no distress. He is back at her baseline. She's concerned about these seizures. Her evaluation showed normal blood work except for elevated Dilantin level of 27.1. I discussed this case with Dr. Cyril Mourning who is the neurologist on call. He has come down and see patient. He recommended admission for observation given her seizures with no cause with elevated Dilantin level and maximum dose of her VIMPAT. Patient also continues to say she does not feel like herself but unable to pinpoint an exact symptoms.  [facility] and still come down and see her and admit to obs. She will have MRI of her brain tonight.  Filed Vitals:   06/11/13 1825 06/11/13 1900 06/11/13 2100 06/12/13 0000  BP: 102/45  119/50   Pulse: 64 64    Temp: 98.1 F (36.7 C)  97.8 F (36.6 C) 98 F (36.7 C)  TempSrc: Oral  Oral Oral  Resp: 15 14    SpO2: 99% 95%         Jennife Zaucha A Elizbeth Posa, PA-C 06/12/13 0041

## 2013-06-11 NOTE — Progress Notes (Signed)
MEDICATION RELATED CONSULT NOTE - INITIAL   Pharmacy Consult for Dilantin  Indication: Seizures  Allergies  Allergen Reactions  . Codeine Nausea And Vomiting  . Keppra [Levetiracetam] Other (See Comments)    Causes seizures.   [name] Rash    Patient Measurements:   Wt=54.4 kg  Vital Signs: Temp: 98.1 F (36.7 C) (11/08 1825) Temp src: Oral (11/08 1825) BP: 102/45 mmHg (11/08 1825) Pulse Rate: 64 (11/08 1900) Intake/Output from previous day:   Intake/Output from this shift:    Labs:  Recent Labs  06/11/13 1530  WBC 4.0  HGB 12.9  HCT 38.7  PLT 129*  CREATININE 0.67  MG 1.9  ALBUMIN 3.7  PROT 6.3  AST 15  ALT 10  ALKPHOS 58  BILITOT 0.1*   The CrCl is unknown because both a height and weight (above a minimum accepted value) are required for this calculation.   Microbiology: No results found for this or any previous visit (from the past 720 hour(s)).  Medical History: Past Medical History  Diagnosis Date  . Seizures   . DVT (deep venous thrombosis)   . Asthma   . Cerebral hemorrhage   . Chronic back pain     Medications:  Scheduled:  . enoxaparin (LOVENOX) injection  40 mg Subcutaneous Q24H  . lacosamide  200 mg Oral BID  . sodium chloride  3 mL Intravenous Q12H   Infusions:  . 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  Presents today with seizures.  Says compliant with meds:  Vimpat and Phenytion.  Phenytoin level= 27.1 and Alb=3.7.  Goal of Therapy:  Treat seizures  Plan:   No Dilantin tonight  Check am level  Redose when appropriate  Lorenza Evangelist 06/11/2013,8:27 PM

## 2013-06-11 NOTE — ED Notes (Signed)
Bed: WA03 Expected date: 06/11/13 Expected time: 2:10 PM Means of arrival: Ambulance Comments: ? Seizure

## 2013-06-12 ENCOUNTER — Encounter (HOSPITAL_COMMUNITY): Payer: Self-pay | Admitting: Internal Medicine

## 2013-06-12 DIAGNOSIS — IMO0002 Reserved for concepts with insufficient information to code with codable children: Secondary | ICD-10-CM

## 2013-06-12 DIAGNOSIS — R202 Paresthesia of skin: Secondary | ICD-10-CM | POA: Diagnosis present

## 2013-06-12 HISTORY — DX: Reserved for concepts with insufficient information to code with codable children: IMO0002

## 2013-06-12 LAB — HEMOGLOBIN A1C
Hgb A1c MFr Bld: 5.2 % (ref <5.7)
Mean Plasma Glucose: 103 mg/dL (ref <117)

## 2013-06-12 LAB — CBC
Hemoglobin: 13.3 g/dL (ref 12.0–15.0)
MCH: 34.8 pg — ABNORMAL HIGH (ref 26.0–34.0)
MCHC: 33.7 g/dL (ref 30.0–36.0)
Platelets: 122 10*3/uL — ABNORMAL LOW (ref 150–400)
RBC: 3.82 MIL/uL — ABNORMAL LOW (ref 3.87–5.11)

## 2013-06-12 LAB — PHENYTOIN LEVEL, TOTAL: Phenytoin Lvl: 23 ug/mL — ABNORMAL HIGH (ref 10.0–20.0)

## 2013-06-12 LAB — BASIC METABOLIC PANEL
Calcium: 9.5 mg/dL (ref 8.4–10.5)
Chloride: 106 mEq/L (ref 96–112)
GFR calc non Af Amer: >90 mL/min (ref >90)
Glucose, Bld: 85 mg/dL (ref 70–99)
Potassium: 3.6 mEq/L (ref 3.5–5.1)
Sodium: 140 mEq/L (ref 135–145)

## 2013-06-12 LAB — VITAMIN B12: Vitamin B-12: 328 pg/mL (ref 211–911)

## 2013-06-12 MED ORDER — NICOTINE 14 MG/24HR TD PT24
14.0000 mg | MEDICATED_PATCH | Freq: Every day | TRANSDERMAL | Status: DC | PRN
  Filled 2013-06-12: qty 1

## 2013-06-12 NOTE — Progress Notes (Signed)
Patient transferring to room 1502.  Report called to Remy, Charity fundraiser.  Patient to travel by wheelchair.  Will continue to monitor.

## 2013-06-12 NOTE — Progress Notes (Signed)
[facility] PROGRESS NOTE  ASUZENA WEIS ZOX:[id number] DOB: [birth year] DOA: 06/11/2013 PCP: Pearson Grippe, MD  Brief narrative: [name] is an 57 y.o. 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 Her Dilantin level was supratherapeutic on admission. An MRI of the brain shows extensive encephalomalacia of the right brain, but no significant change from prior scans. She has been evaluated by the neurologist.  Assessment/Plan: Principal Problem:   Seizure disorder The patient was admitted and her Dilantin level was checked and found to be supratherapeutic, so was held. Pharmacy will now dose her Dilantin. Seen by Dr. Cyril Mourning neurology who recommended obtaining an MRI of the brain to rule out structural changes. The MRI was stable when compared to prior scans. Extensive encephalomalacia of the right brain noted. No current recommendations for change preadmission regimen. Advance diet. Move out of SDU. Active Problems:   Thrombocytopenia Chronic. Mild.   Tobacco abuse Counseled. Will order a nicotine patch if needed.   Dilantin toxicity Possibly the source of tremulousness and paresthesias. She is being monitored in the step down unit. Pharmacy to dose Dilantin once the levels come down. Dilantin 27.1---> 23.0. Still supratherapeutic. Continue to hydrate for now.   History of Intracranial bleed Secondary to AVM. Seizures developed after this.   Tremor / paresthesias Unclear etiology, but Dilantin toxicity in the differential. Workup initiated as an outpatient with lumbar MRI which showed degenerative disc disease but no evidence of spinal  stenosis or nerve root encroachment. Followup B12 level.  Code Status: Full. Family Communication: No family at the bedside. Disposition Plan: Home when stable.   IV access:  Peripheral IV  Medical Consultants:  Dr. Wyatt Portela, Neurology  Other Consultants:  None.  Anti-infectives:  None.  HPI/Subjective: [name] continues to complain of numbness affecting her lower extremities along with a tight feeling. She tells me that she has had episodes of shaking of her muscles without losing consciousness for the past several days. He shaking spells occur approximately 4 times a day and last for 30 minutes at a time.  Objective: Filed Vitals:   06/11/13 1900 06/11/13 2100 06/12/13 0000 06/12/13 0400  BP:  119/50 103/39 109/56  Pulse: 64 49 63 60  Temp:  97.8 F (36.6 C) 98 F (36.7 C) 98.1 F (36.7 C)  TempSrc:  Oral Oral Oral  Resp: 14  16 16   Height:  5\' 7"  (1.702 m)    Weight:  52.6 kg (115 lb 15.4 oz)  54.1 kg (119 lb 4.3 oz)  SpO2: 95% 98% 94% 93%    Intake/Output Summary (Last 24 hours) at 06/12/13 8119 Last data filed at 06/12/13 0600  Gross per 24 hour  Intake    600 ml  Output    550 ml  Net     50 ml    Exam: Gen:  NAD Cardiovascular:  RRR, No M/R/G Respiratory:  Lungs diminished Gastrointestinal:  Abdomen soft, NT/ND, + BS Extremities:  No C/E/C Neuro: Brisk patellar reflex on the left, unable to elicit on the right  Data Reviewed: Basic Metabolic Panel:  Recent Labs Lab 06/05/13 1117 06/11/13 1530 06/12/13 0335  NA 142 137 140  K 4.2 3.7 3.6  CL 107 102  106  CO2 28 26 28   GLUCOSE 83 119* 85  BUN 9 13 8   CREATININE 0.69 0.67 0.64  CALCIUM 8.9 9.2 9.5  MG  --  1.9  --    GFR Estimated Creatinine Clearance: 67.9 ml/min (by C-G formula based on Cr of 0.64). Liver Function Tests:  Recent Labs Lab 06/11/13 1530  AST 15  ALT 10  ALKPHOS 58  BILITOT 0.1*  PROT 6.3  ALBUMIN 3.7   CBC:  Recent Labs Lab 06/05/13 1117  06/11/13 1530 06/12/13 0335  WBC 2.9* 4.0 4.3  NEUTROABS 1.2* 2.0  --   HGB 13.1 12.9 13.3  HCT 38.2 38.7 39.5  MCV 102.4* 103.5* 103.4*  PLT 130* 129* 122*   Hgb A1c  Recent Labs  06/11/13 1530  HGBA1C 5.2   Microbiology Recent Results (from the past 240 hour(s))  MRSA PCR SCREENING     Status: None   Collection Time    06/11/13 10:00 PM      Result Value Range Status   MRSA by PCR NEGATIVE  NEGATIVE Final   Comment:            The GeneXpert MRSA Assay (FDA     approved for NASAL specimens     only), is one component of a     comprehensive MRSA colonization     surveillance program. It is not     intended to diagnose MRSA     infection nor to guide or     monitor treatment for     MRSA infections.     Procedures and Diagnostic Studies: Ct Head Wo Contrast  06/11/2013   CLINICAL DATA:  Seizure history, history of brain surgery, complaining of upper body tremors  EXAM: CT HEAD WITHOUT CONTRAST  TECHNIQUE: Contiguous axial images were obtained from the base of the skull through the vertex without intravenous contrast.  COMPARISON:  04/27/2013  FINDINGS: Status post right craniotomy. Large area of encephalomalacia right parieto-occipital lobe, unchanged. No hemorrhage or acute infarct. No extra-axial fluid. Stable ex vacuo dilatation right ventricle occipital horn.  IMPRESSION: No change when compared to prior study. No acute findings.   Electronically Signed   By: Esperanza Heir M.D.   On: 06/11/2013 15:57   Mr [name] HY Contrast  06/11/2013   CLINICAL DATA:  History of recurrent generalized tonic-clonic seizures, with persistent seizures today. History of hemorrhage and subsequent surgery for AVM.  EXAM: MRI HEAD WITHOUT AND WITH CONTRAST  TECHNIQUE: Multiplanar, multiecho pulse sequences of the brain and surrounding structures were obtained without and with intravenous contrast.  COMPARISON:  CT head earlier today. Most recent MR brain 08/16/2010. Also prior CT scan  04/10/2004.  FINDINGS: No restricted diffusion to suggest acute stroke or visible seizure focus. Extensive right parieto-occipital encephalomalacia representing prior hemorrhage and postsurgical change, unchanged from priors.  Minimal cerebral and cerebellar atrophy. Mild subcortical and periventricular T2 and FLAIR hyperintensities, likely chronic microvascular ischemic change. No mass lesion, hydrocephalus, or extra-axial fluid.  Chronic hemorrhage at the surgical site. View flow voids are maintained in the carotid, basilar, and vertebral arteries.  Post infusion, no abnormal enhancement of the brain or meninges. Mild dural enhancement at the surgical site is postoperative in nature. No visible residual enhancing vascular nidus. No evidence for venous sinus thrombosis.  Normal pituitary, cerebellar tonsils, and upper cervical region. No osseous lesions. Negative orbits. No acute sinus disease. Mild left mastoid fluid similar to priors.  IMPRESSION: Extensive right parieto-occipital encephalomalacia representing prior  hemorrhage and postsurgical change. No acute intracranial findings. Similar appearance to prior CT and MR.   Electronically Signed   By: Davonna Belling M.D.   On: 06/11/2013 21:10   Mr [name]  06/05/2013   CLINICAL DATA:  Bilateral foot numbness for 3 days. History of seizures.  EXAM: MRI LUMBAR SPINE WITHOUT CONTRAST  TECHNIQUE: Multiplanar, multisequence MR imaging was performed. No intravenous contrast was administered.  COMPARISON:  Radiographs 07/23/2011. CT 05/20/2010.  FINDINGS: Prior studies demonstrate 5 lumbar-type vertebral bodies. The alignment is normal. There is no evidence of fracture or pars defect. There are scattered endplate degenerative changes and Schmorl's nodes, similar to prior CT.  The conus medullaris extends to the L1-2 level and appears normal. No paraspinal abnormalities are identified. Dilatation of the thoracic duct in the right retrocrural area is  unchanged from prior CT.  There is mild disc bulging with anterior osteophyte formation at T11-12.  There are no significant disc space findings at T12-L1 or L1-2.  L2-3: Mild disc bulging and facet hypertrophy. No spinal stenosis or nerve root encroachment.  L3-4: Mild disc bulging with mild facet and ligamentous hypertrophy. There is minimal narrowing of the left lateral recess without definite nerve root encroachment. The foramina are patent.  L4-5: Mild disc bulging with mild facet and ligamentous hypertrophy. No significant spinal stenosis or nerve root encroachment.  L5-S1: Disc height and hydration are maintained. There is mild bilateral facet hypertrophy. No spinal stenosis or nerve root encroachment.  IMPRESSION: 1. No acute findings or explanation for the patient's symptoms. 2. Mild thoracolumbar degenerative changes with disc bulging and facet disease. No significant spinal stenosis or nerve root encroachment.   Electronically Signed   By: Roxy Horseman M.D.   On: 06/05/2013 13:16    Scheduled Meds: . enoxaparin (LOVENOX) injection  40 mg Subcutaneous Q24H  . lacosamide  200 mg Oral BID  . sodium chloride  3 mL Intravenous Q12H   Continuous Infusions: . sodium chloride 75 mL/hr at 06/11/13 2200    Time spent: 35 minutes with > 50% of time discussing current diagnostic test results, clinical impression and plan of care.    LOS: 1 day   Zeda Gangwer  [facility] Pager [phone].   *Please note that the hospitalists switch teams on Wednesdays. Please call the flow manager at [phone] if you are having difficulty reaching the hospitalist taking care of this patient as she can update you and provide the most up-to-date pager number of provider caring for the patient. If 8PM-8AM, please contact night-coverage at [email or web address], password [facility]  06/12/2013, 7:13 AM

## 2013-06-12 NOTE — ED Provider Notes (Signed)
Medical screening examination/treatment/procedure(s) were performed by non-physician practitioner and as supervising physician I was immediately available for consultation/collaboration.  EKG Interpretation     Ventricular Rate:  57 PR Interval:  147 QRS Duration: 86 QT Interval:  416 QTC Calculation: 405 R Axis:   94 Text Interpretation:  Sinus rhythm Borderline right axis deviation Nonspecific T abnormalities, lateral leads No significant change since last tracing              Audree Camel, MD 06/12/13 1054

## 2013-06-12 NOTE — Progress Notes (Addendum)
MEDICATION RELATED CONSULT NOTE   Pharmacy Consult for Dilantin  Indication: Seizures  Allergies  Allergen Reactions  . Codeine Nausea And Vomiting  . Keppra [Levetiracetam] Other (See Comments)    Causes seizures.   [name] Rash    Patient Measurements: Height: 5\' 7"  (170.2 cm) Weight: 119 lb 4.3 oz (54.1 kg) IBW/kg (Calculated) : 61.6 Wt=54.4 kg  Vital Signs: Temp: 98.1 F (36.7 C) (11/09 0400) Temp src: Oral (11/09 0400) BP: 109/56 mmHg (11/09 0400) Pulse Rate: 60 (11/09 0400) Intake/Output from previous day: 11/08 0701 - 11/09 0700 In: 600 [I.V.:600] Out: 550 [Urine:550] Intake/Output from this shift:    Labs:  Recent Labs  06/11/13 1530 06/12/13 0335  WBC 4.0 4.3  HGB 12.9 13.3  HCT 38.7 39.5  PLT 129* 122*  CREATININE 0.67 0.64  MG 1.9  --   ALBUMIN 3.7  --   PROT 6.3  --   AST 15  --   ALT 10  --   ALKPHOS 58  --   BILITOT 0.1*  --    Estimated Creatinine Clearance: 67.9 ml/min (by C-G formula based on Cr of 0.64).   Microbiology: Recent Results (from the past 720 hour(s))  MRSA PCR SCREENING     Status: None   Collection Time    06/11/13 10:00 PM      Result Value Range Status   MRSA by PCR NEGATIVE  NEGATIVE Final   Comment:            The GeneXpert MRSA Assay (FDA     approved for NASAL specimens     only), is one component of a     comprehensive MRSA colonization     surveillance program. It is not     intended to diagnose MRSA     infection nor to guide or     monitor treatment for     MRSA infections.    Medical History: Past Medical History  Diagnosis Date  . Seizures   . DVT (deep venous thrombosis)   . Asthma   . Cerebral hemorrhage   . Chronic back pain     Medications:  Scheduled:  . enoxaparin (LOVENOX) injection  40 mg Subcutaneous Q24H  . lacosamide  200 mg Oral BID  . sodium chloride  3 mL Intravenous Q12H   Infusions:  . 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  Presents today with seizures.  Says compliant with meds:  Vimpat and Phenytion. Neurology following  11/8 (admission) Phenytoin level= 27.1 and Alb=3.7 --> corrects to 32.3  11/9 am Phenytoin level = 23 --> corrects to 27.4  Goal of Therapy:  Treat seizures  Plan:   Continue to hold phenytoin  Neurology states hold phenytoin until level is 16-67mcg/ml  Neuro has ordered phenytoin level for 7pm tonight  Redose when appropriate  Juliette Alcide, PharmD, BCPS.   Pager: [phone]  06/12/2013,7:10 AM

## 2013-06-13 ENCOUNTER — Telehealth: Payer: Self-pay | Admitting: Diagnostic Neuroimaging

## 2013-06-13 LAB — PHENYTOIN LEVEL, TOTAL: Phenytoin Lvl: 14.1 ug/mL (ref 10.0–20.0)

## 2013-06-13 MED ORDER — LORAZEPAM 0.5 MG PO TABS
0.5000 mg | ORAL_TABLET | Freq: Three times a day (TID) | ORAL | Status: DC | PRN
Start: 2013-06-13 — End: 2013-06-17

## 2013-06-13 MED ORDER — GABAPENTIN 300 MG PO CAPS: 300.0000 mg | ORAL_CAPSULE | Freq: Every day | ORAL | Status: DC

## 2013-06-13 MED ORDER — PHENYTOIN SODIUM EXTENDED 100 MG PO CAPS
200.0000 mg | ORAL_CAPSULE | Freq: Every day | ORAL | Status: DC
  Filled 2013-06-13: qty 2

## 2013-06-13 MED ORDER — PHENYTOIN SODIUM EXTENDED 100 MG PO CAPS
100.0000 mg | ORAL_CAPSULE | Freq: Every day | ORAL | Status: DC
  Administered 2013-06-13: 100 mg via ORAL
  Filled 2013-06-13: qty 1

## 2013-06-13 NOTE — Progress Notes (Signed)
MEDICATION RELATED CONSULT NOTE   Pharmacy Consult for Dilantin  Indication: Seizures  Allergies  Allergen Reactions  . Codeine Nausea And Vomiting  . Keppra [Levetiracetam] Other (See Comments)    Causes seizures.   [name] Rash   Patient Measurements: Height: 5\' 7"  (170.2 cm) Weight: 134 lb 7.7 oz (61 kg) IBW/kg (Calculated) : 61.6 Wt=54.4 kg  Labs:  Recent Labs  06/11/13 1530 06/12/13 0335  WBC 4.0 4.3  HGB 12.9 13.3  HCT 38.7 39.5  PLT 129* 122*  CREATININE 0.67 0.64  MG 1.9  --   ALBUMIN 3.7  --   PROT 6.3  --   AST 15  --   ALT 10  --   ALKPHOS 58  --   BILITOT 0.1*  --    Estimated Creatinine Clearance: 76.5 ml/min (by C-G formula based on Cr of 0.64).  Medical History: Past Medical History  Diagnosis Date  . Seizures   . DVT (deep venous thrombosis)   . Asthma   . Cerebral hemorrhage   . Chronic back pain   . DDD (degenerative disc disease) 06/12/2013   Medications:  Scheduled:  . enoxaparin (LOVENOX) injection  40 mg Subcutaneous Q24H  . lacosamide  200 mg Oral BID  . phenytoin  100 mg Oral Daily   And  . phenytoin  200 mg Oral QHS  . 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  Presented with seizures and supratherapeutic Phenytoin level.  Says compliant with meds:  Vimpat and Phenytoin. Neurology following and now signed off  11/8 (admission) Phenytoin level= 27.1 and Alb=3.7 --> corrects to 32.3  11/9 am Phenytoin level = 23 --> corrects to 27.4  11/10 am Phenytoin level 14.1 corrects to 16.8 (alb 3.7)  No seizures this admission  Goal of Therapy:  Treat seizures  Plan:   Phenytoin level within desired range  Neurology states hold phenytoin until level is 16-82mcg/ml  Resume home Phenytoin dose 100mg  daily, 200mg  qhs  Burnice, Vassel PharmD Pager [phone] 06/13/2013, 9:58 AM

## 2013-06-13 NOTE — Progress Notes (Signed)
Pt refused to be set up on [email or web address] here.  She said she would like to set it up at home with her daughter who has a working Animator.

## 2013-06-13 NOTE — Progress Notes (Signed)
NEURO HOSPITALIST PROGRESS NOTE   SUBJECTIVE:                                                                                                                        Felling better today. No further seizures.   OBJECTIVE:                                                                                                                           Vital signs in last 24 hours: Temp:  [97.8 F (36.6 C)-98.4 F (36.9 C)] 97.8 F (36.6 C) (11/10 0534) Pulse Rate:  [68-78] 68 (11/10 0534) Resp:  [16-20] 20 (11/10 0534) BP: (92-108)/(52-67) 108/52 mmHg (11/10 0534) SpO2:  [94 %-99 %] 99 % (11/10 0534) Weight:  [61 kg (134 lb 7.7 oz)] 61 kg (134 lb 7.7 oz) (11/09 1035)  Intake/Output from previous day: 11/09 0701 - 11/10 0700 In: 2618.8 [P.O.:840; I.V.:1778.8] Out: 2550 [Urine:2550] Intake/Output this shift:   Nutritional status: General  Past Medical History  Diagnosis Date  . Seizures   . DVT (deep venous thrombosis)   . Asthma   . Cerebral hemorrhage   . Chronic back pain   . DDD (degenerative disc disease) 06/12/2013     Neurologic Exam:  Mental Status: Alert, oriented, thought content appropriate.  Speech fluent without evidence of aphasia.  Able to follow 3 step commands without difficulty. Cranial Nerves: II: Discs flat bilaterally; decreased vision in left visual field, pupils equal, round, reactive to light and accommodation III,IV, VI: ptosis not present, extra-ocular motions intact bilaterally V,VII: smile symmetric, facial light touch sensation normal bilaterally VIII: hearing normal bilaterally IX,X: gag reflex present XI: bilateral shoulder shrug XII: midline tongue extension without atrophy or fasciculations  Motor: Right : Upper extremity   5/5    Left:     Upper extremity   5/5  Lower extremity   5/5     Lower extremity   5/5  -head tremulousness (old) Tone and bulk:normal tone throughout; no atrophy noted Sensory:  Pinprick and light touch intact throughout, bilaterally Deep Tendon Reflexes:  Right: Upper Extremity   Left: Upper extremity   biceps (C-5 to C-6) 2/4   biceps (C-5 to C-6) 2/4 tricep (C7) 2/4  triceps (C7) 2/4 Brachioradialis (C6) 2/4  Brachioradialis (C6) 2/4  Lower Extremity Lower Extremity  quadriceps (L-2 to L-4) 2/4   quadriceps (L-2 to L-4) 2/4 Achilles (S1) 2/4   Achilles (S1) 2/4  Plantars: Right: downgoing   Left: downgoing Cerebellar: normal finger-to-nose,  normal heel-to-shin test CV: pulses palpable throughout    Lab Results: No results found for this basename: cbc, bmp, coags, chol, tri, ldl, hga1c   Lipid Panel No results found for this basename: CHOL, TRIG, HDL, CHOLHDL, VLDL, LDLCALC,  in the last 72 hours  Studies/Results: Ct Head Wo Contrast  06/11/2013   CLINICAL DATA:  Seizure history, history of brain surgery, complaining of upper body tremors  EXAM: CT HEAD WITHOUT CONTRAST  TECHNIQUE: Contiguous axial images were obtained from the base of the skull through the vertex without intravenous contrast.  COMPARISON:  04/27/2013  FINDINGS: Status post right craniotomy. Large area of encephalomalacia right parieto-occipital lobe, unchanged. No hemorrhage or acute infarct. No extra-axial fluid. Stable ex vacuo dilatation right ventricle occipital horn.  IMPRESSION: No change when compared to prior study. No acute findings.   Electronically Signed   By: Esperanza Heir M.D.   On: 06/11/2013 15:57   Mr [name] WU Contrast  06/11/2013   CLINICAL DATA:  History of recurrent generalized tonic-clonic seizures, with persistent seizures today. History of hemorrhage and subsequent surgery for AVM.  EXAM: MRI HEAD WITHOUT AND WITH CONTRAST  TECHNIQUE: Multiplanar, multiecho pulse sequences of the brain and surrounding structures were obtained without and with intravenous contrast.  COMPARISON:  CT head earlier today. Most recent MR brain 08/16/2010. Also prior CT scan  04/10/2004.  FINDINGS: No restricted diffusion to suggest acute stroke or visible seizure focus. Extensive right parieto-occipital encephalomalacia representing prior hemorrhage and postsurgical change, unchanged from priors.  Minimal cerebral and cerebellar atrophy. Mild subcortical and periventricular T2 and FLAIR hyperintensities, likely chronic microvascular ischemic change. No mass lesion, hydrocephalus, or extra-axial fluid.  Chronic hemorrhage at the surgical site. View flow voids are maintained in the carotid, basilar, and vertebral arteries.  Post infusion, no abnormal enhancement of the brain or meninges. Mild dural enhancement at the surgical site is postoperative in nature. No visible residual enhancing vascular nidus. No evidence for venous sinus thrombosis.  Normal pituitary, cerebellar tonsils, and upper cervical region. No osseous lesions. Negative orbits. No acute sinus disease. Mild left mastoid fluid similar to priors.  IMPRESSION: Extensive right parieto-occipital encephalomalacia representing prior hemorrhage and postsurgical change. No acute intracranial findings. Similar appearance to prior CT and MR.   Electronically Signed   By: Davonna Belling M.D.   On: 06/11/2013 21:10    MEDICATIONS                                                                                                                        Scheduled: . enoxaparin (LOVENOX) injection  40 mg Subcutaneous Q24H  . lacosamide  200 mg Oral BID  . sodium chloride  3 mL  Intravenous Q12H    ASSESSMENT/PLAN:                                                                                                            57 y/o with refractory symptomatic GTC epilepsy, with 2 seizures in setting of supra-therapeutic Dilantin.  At present time no further seizures, Dilantin is now 16.7 corrected. MRI brain was obtained and showed no acute changes.   Recommend: 1) Continue Vimpat and Dilantin per pharmacy.  2) Follow up with Dr. Anne Hahn  out patient   Neurology S/O  Assessment and plan discussed with with attending physician and they are in agreement.    Felicie Morn PA-C [facility] [phone]  06/13/2013, 9:22 AM

## 2013-06-13 NOTE — Discharge Summary (Signed)
Physician Discharge Summary  [name] RUE:[id number] DOB: Jan 01, 1956 DOA: 06/11/2013  PCP: Pearson Grippe, MD  Admit date: 06/11/2013 Discharge date: 06/13/2013  Recommendations for Outpatient Follow-up:  1. Followup with neurologist in 4-6 weeks.  Discharge Diagnoses:  Principal Problem:    Seizure disorder with breakthrough seizures Active Problems:    Dilantin toxicity    History of Intracranial bleed    Tobacco dependence    Thrombocytopenia- chronic    Tremor    DDD (degenerative disc disease)    Paresthesias  Discharge Condition: Improved.  Diet recommendation: Regular.  History of present illness:  [name] is an 57 y.o. 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 Her Dilantin level was supratherapeutic on admission. An MRI of the brain shows extensive encephalomalacia of the right brain, but no significant change from prior scans. She has been evaluated by the neurologist.  Hospital Course by problem:  Principal Problem:  Seizure disorder with breakthrough seizures  The patient was admitted and her Dilantin level was checked and found to be supratherapeutic, so was held until her levels became therapeutic. Usual home dose of Dilantin and resumed 06/13/2013. Seen by Dr. Cyril Mourning neurology who recommended obtaining an MRI of the brain to rule out structural changes. The MRI was stable when compared to prior scans. Extensive encephalomalacia of the right brain noted. No current recommendations for change preadmission regimen. Patient has not had any further seizure activity while in the hospital and is deemed stable for  discharge.  Active Problems:  Thrombocytopenia  Chronic. Mild.  Tobacco abuse  Counseled.  Dilantin toxicity  Possibly the source of tremulousness and paresthesias. Dilantin 27.1---> 23.0---> 14.1. Usual home dose of Dilantin and resumed 06/13/2013. History of Intracranial bleed  Secondary to AVM. Seizures developed after this.  Tremor / paresthesias  Unclear etiology, but Dilantin toxicity in the differential. Workup initiated as an outpatient with lumbar MRI which showed degenerative disc disease but no evidence of spinal stenosis or nerve root encroachment. B12 normal at 328. Spoke with the patient's outpatient neurologist about this, and placed her on Neurontin 300 mg at bedtime. Will followup with neurologist as an outpatient in 6 weeks. Also sent home with prescription for Ativan to use as needed given her complaints of significant anxiety and muscle tremors.  Procedures:  None.  Consultations:  Dr. Wyatt Portela, Neurology   Discharge Exam: Filed Vitals:   06/13/13 1324  BP: 98/61  Pulse: 66  Temp: 98.1 F (36.7 C)  Resp: 18   Filed Vitals:   06/12/13 1351 06/12/13 2148 06/13/13 0534 06/13/13 1324  BP: 101/67 95/59 108/52 98/61  Pulse: 77 78 68 66  Temp: 98.1 F (36.7 C) 98 F (36.7 C) 97.8 F (36.6 C) 98.1 F (36.7 C)  TempSrc: Oral Oral Oral Oral  Resp: 20 20 20 18   Height:      Weight:      SpO2: 98% 94% 99% 100%    Gen:  NAD Cardiovascular:  RRR, No M/R/G Respiratory: Lungs diminished Gastrointestinal: Abdomen soft, NT/ND with normal active bowel sounds. Extremities: No C/E/C   Discharge Instructions  Discharge Orders   Future Appointments Provider Department Dept Phone   12/09/2013 3:30 PM York Spaniel, MD  [facility] [phone]   Future Orders Complete By Expires   Call MD for:  As directed    Scheduling Instructions:     Recurrent seizures.   Diet general  As directed    Discharge instructions  As directed     Comments:     You were cared for by Dr. Hillery Aldo  (a hospitalist) during your hospital stay. If you have any questions about your discharge medications or the care you received while you were in the hospital after you are discharged, you can call the unit and ask to speak with the hospitalist on call if the hospitalist that took care of you is not available. Once you are discharged, your primary care physician will handle any further medical issues. Please note that NO REFILLS for any discharge medications will be authorized once you are discharged, as it is imperative that you return to your primary care physician (or establish a relationship with a primary care physician if you do not have one) for your aftercare needs so that they can reassess your need for medications and monitor your lab values.  Any outstanding tests can be reviewed by your PCP at your follow up visit.  It is also important to review any medicine changes with your PCP.  Please bring these d/c instructions with you to your next visit so your physician can review these changes with you.  If you do not have a primary care physician, you can call [phone] for a physician referral.  It is highly recommended that you obtain a PCP for hospital follow up.   Driving Restrictions  As directed    Comments:     No driving.   Increase activity slowly  As directed        Medication List         albuterol 108 (90 BASE) MCG/ACT inhaler  Commonly known as:  PROVENTIL HFA;VENTOLIN HFA  Inhale 2 puffs into the lungs every 4 (four) hours as needed for wheezing or shortness of breath.     gabapentin 300 MG capsule  Commonly known as:  NEURONTIN  Take 1 capsule (300 mg total) by mouth at bedtime.     lacosamide 200 MG Tabs tablet  Commonly known as:  VIMPAT  Take 1 tablet (200 mg total) by mouth 2 (two) times daily.     LORazepam 0.5 MG tablet  Commonly known as:  ATIVAN  Take 1 tablet (0.5 mg total) by mouth every 8 (eight) hours  as needed for anxiety or seizure.     phenytoin 100 MG ER capsule  Commonly known as:  DILANTIN  Take one tablet in morning and two tablets at night daily           Follow-up Information   Follow up with Pearson Grippe, MD. Schedule an appointment as soon as possible for a visit in 2 weeks.   Specialty:  Internal Medicine   Contact information:   [address] [place] [address] [phone]       Follow up with Joycelyn Schmid, MD. Schedule an appointment as soon as possible for a visit in 6 weeks.   Specialties:  Neurology, Radiology   Contact information:   [address] [place] [address] [phone]        The results of significant diagnostics from this hospitalization (including imaging, microbiology, ancillary and laboratory) are listed below for reference.    Significant Diagnostic Studies: Ct Head Wo Contrast  06/11/2013  CLINICAL DATA:  Seizure history, history of brain surgery, complaining of upper body tremors  EXAM: CT HEAD WITHOUT CONTRAST  TECHNIQUE: Contiguous axial images were obtained from the base of the skull through the vertex without intravenous contrast.  COMPARISON:  04/27/2013  FINDINGS: Status post right craniotomy. Large area of encephalomalacia right parieto-occipital lobe, unchanged. No hemorrhage or acute infarct. No extra-axial fluid. Stable ex vacuo dilatation right ventricle occipital horn.  IMPRESSION: No change when compared to prior study. No acute findings.   Electronically Signed   By: Esperanza Heir M.D.   On: 06/11/2013 15:57   Mr [name] ZO Contrast  06/11/2013   CLINICAL DATA:  History of recurrent generalized tonic-clonic seizures, with persistent seizures today. History of hemorrhage and subsequent surgery for AVM.  EXAM: MRI HEAD WITHOUT AND WITH CONTRAST  TECHNIQUE: Multiplanar, multiecho pulse sequences of the brain and surrounding structures were obtained without and with intravenous contrast.   COMPARISON:  CT head earlier today. Most recent MR brain 08/16/2010. Also prior CT scan 04/10/2004.  FINDINGS: No restricted diffusion to suggest acute stroke or visible seizure focus. Extensive right parieto-occipital encephalomalacia representing prior hemorrhage and postsurgical change, unchanged from priors.  Minimal cerebral and cerebellar atrophy. Mild subcortical and periventricular T2 and FLAIR hyperintensities, likely chronic microvascular ischemic change. No mass lesion, hydrocephalus, or extra-axial fluid.  Chronic hemorrhage at the surgical site. View flow voids are maintained in the carotid, basilar, and vertebral arteries.  Post infusion, no abnormal enhancement of the brain or meninges. Mild dural enhancement at the surgical site is postoperative in nature. No visible residual enhancing vascular nidus. No evidence for venous sinus thrombosis.  Normal pituitary, cerebellar tonsils, and upper cervical region. No osseous lesions. Negative orbits. No acute sinus disease. Mild left mastoid fluid similar to priors.  IMPRESSION: Extensive right parieto-occipital encephalomalacia representing prior hemorrhage and postsurgical change. No acute intracranial findings. Similar appearance to prior CT and MR.   Electronically Signed   By: Davonna Belling M.D.   On: 06/11/2013 21:10   Mr [name]  06/05/2013   CLINICAL DATA:  Bilateral foot numbness for 3 days. History of seizures.  EXAM: MRI LUMBAR SPINE WITHOUT CONTRAST  TECHNIQUE: Multiplanar, multisequence MR imaging was performed. No intravenous contrast was administered.  COMPARISON:  Radiographs 07/23/2011. CT 05/20/2010.  FINDINGS: Prior studies demonstrate 5 lumbar-type vertebral bodies. The alignment is normal. There is no evidence of fracture or pars defect. There are scattered endplate degenerative changes and Schmorl's nodes, similar to prior CT.  The conus medullaris extends to the L1-2 level and appears normal. No paraspinal  abnormalities are identified. Dilatation of the thoracic duct in the right retrocrural area is unchanged from prior CT.  There is mild disc bulging with anterior osteophyte formation at T11-12.  There are no significant disc space findings at T12-L1 or L1-2.  L2-3: Mild disc bulging and facet hypertrophy. No spinal stenosis or nerve root encroachment.  L3-4: Mild disc bulging with mild facet and ligamentous hypertrophy. There is minimal narrowing of the left lateral recess without definite nerve root encroachment. The foramina are patent.  L4-5: Mild disc bulging with mild facet and ligamentous hypertrophy. No significant spinal stenosis or nerve root encroachment.  L5-S1: Disc height and hydration are maintained. There is mild bilateral facet hypertrophy. No spinal stenosis or nerve root encroachment.  IMPRESSION: 1. No acute findings or explanation for the patient's symptoms. 2. Mild thoracolumbar degenerative changes with disc bulging and facet disease. No significant spinal stenosis  or nerve root encroachment.   Electronically Signed   By: Roxy Horseman M.D.   On: 06/05/2013 13:16    Labs:  Basic Metabolic Panel:  Recent Labs Lab 06/11/13 1530 06/12/13 0335  NA 137 140  K 3.7 3.6  CL 102 106  CO2 26 28  GLUCOSE 119* 85  BUN 13 8  CREATININE 0.67 0.64  CALCIUM 9.2 9.5  MG 1.9  --    GFR Estimated Creatinine Clearance: 76.5 ml/min (by C-G formula based on Cr of 0.64). Liver Function Tests:  Recent Labs Lab 06/11/13 1530  AST 15  ALT 10  ALKPHOS 58  BILITOT 0.1*  PROT 6.3  ALBUMIN 3.7   CBC:  Recent Labs Lab 06/11/13 1530 06/12/13 0335  WBC 4.0 4.3  NEUTROABS 2.0  --   HGB 12.9 13.3  HCT 38.7 39.5  MCV 103.5* 103.4*  PLT 129* 122*   Hgb A1c  Recent Labs  06/11/13 1530  HGBA1C 5.2   Anemia work up  Recent Labs  06/12/13 0335  VITAMINB12 328   Microbiology Recent Results (from the past 240 hour(s))  MRSA PCR SCREENING     Status: None   Collection Time     06/11/13 10:00 PM      Result Value Range Status   MRSA by PCR NEGATIVE  NEGATIVE Final   Comment:            The GeneXpert MRSA Assay (FDA     approved for NASAL specimens     only), is one component of a     comprehensive MRSA colonization     surveillance program. It is not     intended to diagnose MRSA     infection nor to guide or     monitor treatment for     MRSA infections.    Time coordinating discharge: 35 minutes.  Signed:  Angelee Bahr  Pager [phone] [facility] 06/13/2013, 3:28 PM

## 2013-06-13 NOTE — Telephone Encounter (Signed)
Patient admitted for breakthrough seizures / tremors. Dr. Darnelle Catalan is planning to d/c patient, but patient complaining of leg numbness. Suggested gabapentin 300mg  qhs and follow up in neurology clinic in 4-6 weeks.   Suanne Marker, MD 06/13/2013, 2:52 PM Certified in Neurology, Neurophysiology and Neuroimaging  [facility] [address] [place], [place] [address] [phone]

## 2013-06-14 ENCOUNTER — Telehealth: Payer: Self-pay | Admitting: Neurology

## 2013-06-14 LAB — CBC WITH DIFFERENTIAL
Basophils Absolute: 0 10*3/uL (ref 0.0–0.2)
Basos: 0 %
Eosinophils Absolute: 0 10*3/uL (ref 0.0–0.4)
HCT: 41.5 % (ref 34.0–46.6)
Hemoglobin: 13.8 g/dL (ref 11.1–15.9)
Immature Granulocytes: 0 %
Lymphocytes Absolute: 1.4 10*3/uL (ref 0.7–3.1)
Lymphs: 33 %
MCH: 34.2 pg — ABNORMAL HIGH (ref 26.6–33.0)
MCHC: 33.3 g/dL (ref 31.5–35.7)
MCV: 103 fL — ABNORMAL HIGH (ref 79–97)
Monocytes Absolute: 0.3 10*3/uL (ref 0.1–0.9)
Neutrophils Absolute: 2.5 10*3/uL (ref 1.4–7.0)
RDW: 12.9 % (ref 12.3–15.4)

## 2013-06-14 LAB — RPR: RPR: NONREACTIVE

## 2013-06-14 LAB — PHENYTOIN LEVEL, TOTAL: Phenytoin Lvl: 32.8 ug/mL (ref 10.0–20.0)

## 2013-06-14 LAB — COMPREHENSIVE METABOLIC PANEL
ALT: 11 IU/L (ref 0–32)
Albumin/Globulin Ratio: 2.8 — ABNORMAL HIGH (ref 1.1–2.5)
Albumin: 4.5 g/dL (ref 3.5–5.5)
Calcium: 9.1 mg/dL (ref 8.7–10.2)
Creatinine, Ser: 0.69 mg/dL (ref 0.57–1.00)
GFR calc Af Amer: 113 mL/min/{1.73_m2} (ref >59)
GFR calc non Af Amer: 98 mL/min/{1.73_m2} (ref >59)
Globulin, Total: 1.6 g/dL (ref 1.5–4.5)
Glucose: 74 mg/dL (ref 65–99)
Potassium: 4.3 mmol/L (ref 3.5–5.2)
Total Bilirubin: <0.2 mg/dL (ref 0.0–1.2)
Total Protein: 6.1 g/dL (ref 6.0–8.5)

## 2013-06-14 LAB — VITAMIN B1, WHOLE BLOOD: Thiamine: 206.5 nmol/L — ABNORMAL HIGH (ref 66.5–200.0)

## 2013-06-14 LAB — VITAMIN B12: Vitamin B-12: 239 pg/mL (ref 211–946)

## 2013-06-14 MED ORDER — PHENYTOIN SODIUM EXTENDED 100 MG PO CAPS: ORAL_CAPSULE | ORAL | Status: DC

## 2013-06-14 NOTE — Telephone Encounter (Signed)
I called patient. The patient was in the hospital recently with Dilantin toxicity. Level was 32. The patient was placed back on the same dose of Dilantin it made her toxic. I'll cut the dose back to 100 mg twice daily. The patient can follow with Dr. Marjory Lies.

## 2013-06-14 NOTE — Telephone Encounter (Signed)
Spoke to patient. Attempted to schedule 4-6 week office f/u per Dr. Marjory Lies. Patient stated she is scheduled to go to [facility] neurology for testing on 07/22/13. She will speak with daughter to see what date she is available to bring her to f/u with our office after the [facility] neurology test/ov. Agreed.

## 2013-06-15 ENCOUNTER — Telehealth: Payer: Self-pay | Admitting: *Deleted

## 2013-06-15 NOTE — Telephone Encounter (Signed)
I called and LMVM for pt re: appt 6-8 wks from now.   08-09-13 in the am with LL/NP.  She needs to call and schedule.

## 2013-06-15 NOTE — Telephone Encounter (Signed)
I called pt after speaking with Dr. Marjory Lies.   He stated that pt can decide who she wants to continue with.  She stated that she will continue with Dr. Anne Hahn since she has been with him recently since she has been sick.  When do you want to see pt back Dr. Anne Hahn.  She has 6 mo RV in May.  Since Dilantin toxicity, do you want to see her sooner?  Please advise.

## 2013-06-15 NOTE — Telephone Encounter (Signed)
Will need to see her back within the next 6-8 weeks.

## 2013-06-17 ENCOUNTER — Encounter (HOSPITAL_COMMUNITY): Payer: Self-pay | Admitting: Emergency Medicine

## 2013-06-17 ENCOUNTER — Telehealth: Payer: Self-pay | Admitting: Neurology

## 2013-06-17 ENCOUNTER — Inpatient Hospital Stay (HOSPITAL_COMMUNITY)
Admission: EM | Admit: 2013-06-17 | Discharge: 2013-06-20 | DRG: 101 | Disposition: A | Discharge status: Home / Self Care | Payer: Medicare Other | Attending: Internal Medicine | Admitting: Internal Medicine

## 2013-06-17 DIAGNOSIS — Z8673 Personal history of transient ischemic attack (TIA), and cerebral infarction without residual deficits: Secondary | ICD-10-CM

## 2013-06-17 DIAGNOSIS — Z8249 Family history of ischemic heart disease and other diseases of the circulatory system: Secondary | ICD-10-CM

## 2013-06-17 DIAGNOSIS — D539 Nutritional anemia, unspecified: Secondary | ICD-10-CM | POA: Diagnosis present

## 2013-06-17 DIAGNOSIS — G40802 Other epilepsy, not intractable, without status epilepticus: Secondary | ICD-10-CM

## 2013-06-17 DIAGNOSIS — IMO0002 Reserved for concepts with insufficient information to code with codable children: Secondary | ICD-10-CM

## 2013-06-17 DIAGNOSIS — I10 Essential (primary) hypertension: Secondary | ICD-10-CM | POA: Diagnosis present

## 2013-06-17 DIAGNOSIS — D696 Thrombocytopenia, unspecified: Secondary | ICD-10-CM

## 2013-06-17 DIAGNOSIS — I82409 Acute embolism and thrombosis of unspecified deep veins of unspecified lower extremity: Secondary | ICD-10-CM | POA: Diagnosis present

## 2013-06-17 DIAGNOSIS — Z9114 Patient's other noncompliance with medication regimen: Secondary | ICD-10-CM

## 2013-06-17 DIAGNOSIS — I629 Nontraumatic intracranial hemorrhage, unspecified: Secondary | ICD-10-CM

## 2013-06-17 DIAGNOSIS — R202 Paresthesia of skin: Secondary | ICD-10-CM

## 2013-06-17 DIAGNOSIS — R5381 Other malaise: Secondary | ICD-10-CM | POA: Diagnosis present

## 2013-06-17 DIAGNOSIS — M51379 Other intervertebral disc degeneration, lumbosacral region without mention of lumbar back pain or lower extremity pain: Secondary | ICD-10-CM | POA: Diagnosis present

## 2013-06-17 DIAGNOSIS — D6959 Other secondary thrombocytopenia: Secondary | ICD-10-CM | POA: Diagnosis present

## 2013-06-17 DIAGNOSIS — F439 Reaction to severe stress, unspecified: Secondary | ICD-10-CM

## 2013-06-17 DIAGNOSIS — G40909 Epilepsy, unspecified, not intractable, without status epilepticus: Principal | ICD-10-CM

## 2013-06-17 DIAGNOSIS — Z91148 Patient's other noncompliance with medication regimen for other reason: Secondary | ICD-10-CM

## 2013-06-17 DIAGNOSIS — R259 Unspecified abnormal involuntary movements: Secondary | ICD-10-CM | POA: Diagnosis present

## 2013-06-17 DIAGNOSIS — G9389 Other specified disorders of brain: Secondary | ICD-10-CM | POA: Diagnosis present

## 2013-06-17 DIAGNOSIS — I82402 Acute embolism and thrombosis of unspecified deep veins of left lower extremity: Secondary | ICD-10-CM

## 2013-06-17 DIAGNOSIS — R569 Unspecified convulsions: Secondary | ICD-10-CM

## 2013-06-17 DIAGNOSIS — R7889 Finding of other specified substances, not normally found in blood: Secondary | ICD-10-CM

## 2013-06-17 DIAGNOSIS — F17200 Nicotine dependence, unspecified, uncomplicated: Secondary | ICD-10-CM

## 2013-06-17 DIAGNOSIS — R251 Tremor, unspecified: Secondary | ICD-10-CM

## 2013-06-17 DIAGNOSIS — G8929 Other chronic pain: Secondary | ICD-10-CM | POA: Diagnosis present

## 2013-06-17 DIAGNOSIS — M5137 Other intervertebral disc degeneration, lumbosacral region: Secondary | ICD-10-CM | POA: Diagnosis present

## 2013-06-17 DIAGNOSIS — Z86718 Personal history of other venous thrombosis and embolism: Secondary | ICD-10-CM

## 2013-06-17 DIAGNOSIS — R209 Unspecified disturbances of skin sensation: Secondary | ICD-10-CM | POA: Diagnosis present

## 2013-06-17 LAB — CBC WITH DIFFERENTIAL/PLATELET
Basophils Absolute: 0 10*3/uL (ref 0.0–0.1)
Basophils Relative: 0 % (ref 0–1)
Eosinophils Absolute: 0 10*3/uL (ref 0.0–0.7)
Eosinophils Relative: 0 % (ref 0–5)
Lymphocytes Relative: 23 % (ref 12–46)
MCH: 34.9 pg — ABNORMAL HIGH (ref 26.0–34.0)
MCV: 103.4 fL — ABNORMAL HIGH (ref 78.0–100.0)
Monocytes Relative: 5 % (ref 3–12)
Platelets: 131 10*3/uL — ABNORMAL LOW (ref 150–400)
RBC: 3.78 MIL/uL — ABNORMAL LOW (ref 3.87–5.11)
RDW: 12.6 % (ref 11.5–15.5)
WBC: 4.5 10*3/uL (ref 4.0–10.5)

## 2013-06-17 LAB — BASIC METABOLIC PANEL
CO2: 27 mEq/L (ref 19–32)
Calcium: 9.7 mg/dL (ref 8.4–10.5)
Creatinine, Ser: 0.73 mg/dL (ref 0.50–1.10)
Potassium: 4.2 mEq/L (ref 3.5–5.1)
Sodium: 138 mEq/L (ref 135–145)

## 2013-06-17 LAB — URINALYSIS, ROUTINE W REFLEX MICROSCOPIC
Ketones, ur: NEGATIVE mg/dL
Leukocytes, UA: NEGATIVE
Nitrite: NEGATIVE
Protein, ur: NEGATIVE mg/dL
Urobilinogen, UA: 0.2 mg/dL (ref 0.0–1.0)

## 2013-06-17 LAB — COMPREHENSIVE METABOLIC PANEL
AST: 20 U/L (ref 0–37)
Albumin: 3.9 g/dL (ref 3.5–5.2)
Alkaline Phosphatase: 63 U/L (ref 39–117)
CO2: 27 mEq/L (ref 19–32)
Chloride: 103 mEq/L (ref 96–112)
Creatinine, Ser: 0.65 mg/dL (ref 0.50–1.10)
GFR calc Af Amer: >90 mL/min (ref >90)
GFR calc non Af Amer: >90 mL/min (ref >90)
Potassium: 3.7 mEq/L (ref 3.5–5.1)
Total Bilirubin: 0.3 mg/dL (ref 0.3–1.2)

## 2013-06-17 LAB — VALPROIC ACID LEVEL: Valproic Acid Lvl: 10 ug/mL — ABNORMAL LOW (ref 50.0–100.0)

## 2013-06-17 LAB — PHENYTOIN LEVEL, TOTAL: Phenytoin Lvl: 10 ug/mL (ref 10.0–20.0)

## 2013-06-17 MED ORDER — ONDANSETRON HCL 4 MG PO TABS: 4.0000 mg | ORAL_TABLET | Freq: Four times a day (QID) | ORAL | Status: DC | PRN

## 2013-06-17 MED ORDER — ONDANSETRON HCL 4 MG/2ML IJ SOLN
4.0000 mg | Freq: Four times a day (QID) | INTRAMUSCULAR | Status: DC | PRN
  Administered 2013-06-18 – 2013-06-20 (×2): 4 mg via INTRAVENOUS
  Filled 2013-06-17 (×2): qty 2

## 2013-06-17 MED ORDER — VITAMIN B-12 1000 MCG PO TABS
1000.0000 ug | ORAL_TABLET | Freq: Every day | ORAL | Status: DC
  Administered 2013-06-17 – 2013-06-20 (×4): 1000 ug via ORAL
  Filled 2013-06-17 (×4): qty 1

## 2013-06-17 MED ORDER — PHENYTOIN 50 MG PO CHEW: 50.0000 mg | CHEWABLE_TABLET | Freq: Every day | ORAL | Status: DC

## 2013-06-17 MED ORDER — LACOSAMIDE 50 MG PO TABS
200.0000 mg | ORAL_TABLET | Freq: Two times a day (BID) | ORAL | Status: DC
  Administered 2013-06-17 – 2013-06-20 (×6): 200 mg via ORAL
  Filled 2013-06-17 (×6): qty 4

## 2013-06-17 MED ORDER — ACETAMINOPHEN 325 MG PO TABS
650.0000 mg | ORAL_TABLET | Freq: Four times a day (QID) | ORAL | Status: DC | PRN
  Administered 2013-06-19 (×2): 650 mg via ORAL
  Filled 2013-06-17 (×2): qty 2

## 2013-06-17 MED ORDER — VALPROATE SODIUM 500 MG/5ML IV SOLN
1000.0000 mg | Freq: Once | INTRAVENOUS | Status: AC
  Administered 2013-06-17: 17:00:00 1000 mg via INTRAVENOUS
  Filled 2013-06-17: qty 10

## 2013-06-17 MED ORDER — LORAZEPAM 2 MG/ML IJ SOLN: 1.0000 mg | Freq: Four times a day (QID) | INTRAMUSCULAR | Status: DC | PRN

## 2013-06-17 MED ORDER — SODIUM CHLORIDE 0.9 % IV SOLN
350.0000 mg | Freq: Once | INTRAVENOUS | Status: AC
  Administered 2013-06-17: 350 mg via INTRAVENOUS
  Filled 2013-06-17: qty 7

## 2013-06-17 MED ORDER — LORAZEPAM 2 MG/ML IJ SOLN
0.5000 mg | Freq: Once | INTRAMUSCULAR | Status: AC
  Administered 2013-06-17: 0.5 mg via INTRAVENOUS

## 2013-06-17 MED ORDER — SODIUM CHLORIDE 0.9 % IV BOLUS (SEPSIS)
500.0000 mL | Freq: Once | INTRAVENOUS | Status: AC
  Administered 2013-06-17: 500 mL via INTRAVENOUS

## 2013-06-17 MED ORDER — ACETAMINOPHEN 650 MG RE SUPP: 650.0000 mg | Freq: Four times a day (QID) | RECTAL | Status: DC | PRN

## 2013-06-17 MED ORDER — PHENYTOIN SODIUM EXTENDED 100 MG PO CAPS
100.0000 mg | ORAL_CAPSULE | Freq: Two times a day (BID) | ORAL | Status: DC
  Administered 2013-06-17 – 2013-06-20 (×6): 100 mg via ORAL
  Filled 2013-06-17 (×7): qty 1

## 2013-06-17 MED ORDER — LORAZEPAM 2 MG/ML IJ SOLN
INTRAMUSCULAR | Status: AC
  Administered 2013-06-17: 0.5 mg
  Filled 2013-06-17: qty 1

## 2013-06-17 MED ORDER — LORAZEPAM 2 MG/ML IJ SOLN
INTRAMUSCULAR | Status: AC
  Filled 2013-06-17: qty 1

## 2013-06-17 MED ORDER — LORAZEPAM 2 MG/ML IJ SOLN
1.0000 mg | Freq: Once | INTRAMUSCULAR | Status: AC
  Administered 2013-06-17: 1 mg via INTRAVENOUS
  Filled 2013-06-17: qty 1

## 2013-06-17 MED ORDER — VALPROATE SODIUM 500 MG/5ML IV SOLN
500.0000 mg | Freq: Two times a day (BID) | INTRAVENOUS | Status: DC
  Administered 2013-06-17 – 2013-06-20 (×6): 500 mg via INTRAVENOUS
  Filled 2013-06-17 (×7): qty 5

## 2013-06-17 MED ORDER — PHENYTOIN 50 MG PO CHEW
50.0000 mg | CHEWABLE_TABLET | Freq: Two times a day (BID) | ORAL | Status: DC
  Administered 2013-06-17 – 2013-06-20 (×6): 50 mg via ORAL
  Filled 2013-06-17 (×7): qty 1

## 2013-06-17 MED ORDER — SODIUM CHLORIDE 0.9 % IJ SOLN
3.0000 mL | Freq: Two times a day (BID) | INTRAMUSCULAR | Status: DC
  Administered 2013-06-18 – 2013-06-20 (×5): 3 mL via INTRAVENOUS

## 2013-06-17 MED ORDER — ENOXAPARIN SODIUM 40 MG/0.4ML ~~LOC~~ SOLN
40.0000 mg | SUBCUTANEOUS | Status: DC
  Administered 2013-06-17 – 2013-06-18 (×2): 40 mg via SUBCUTANEOUS
  Filled 2013-06-17 (×3): qty 0.4

## 2013-06-17 MED ORDER — ALBUTEROL SULFATE HFA 108 (90 BASE) MCG/ACT IN AERS
2.0000 | INHALATION_SPRAY | RESPIRATORY_TRACT | Status: DC | PRN
  Filled 2013-06-17: qty 6.7

## 2013-06-17 NOTE — H&P (Signed)
[facility] History and Physical  [name] ZOX:[id number] DOB: 1956/05/23 DOA: 06/17/2013   PCP: Pearson Grippe, MD   Chief Complaint: seizure  HPI:  57 y.o. 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 The patient had 2 witnessed seizures on the day prior to admission, but she refused to come to the hospital after the ambulance was called.  On the morning of admission, the patient experienced another tonic-clonic seizure. EMS was activated, and she was brought to the emergency room. The patient had urinary incontinence with procedures. Patient states she has been on multiple seizure medications in the past including Depakote, Keppra, phenobarbital, Tegretol.  Patient claims that she is very compliant with her medications. In fact, the patient was recently discharged from the hospital on 06/13/2013 during which she was noted to be Dilantin toxic. She has good followup with Dr. Anne Hahn of [facility] neurology, and claims that she also sees a neurologist in [place], and is being currently evaluated to see if she is a candidate for epilepsy surgery. She was discharged on 05/13/2013 with Vimpat 200mg  bid and phenytoin 100mg  am, 200mg  pm.  Dr. Anne Hahn changed her phenytoin to 100mg  bid on 06/14/13.  Currently, the patient denies any headache, dizziness, visual disturbance, fevers, chills, chest discomfort, shortness breath, nausea, vomiting, diarrhea, abdominal pain, dysuria. In emergency department, the patient had another witnessed seizure. Ativan was given. The patient was given Dilantin 250 mg IV x1. Vital signs are stable. Urinalysis was negative for pyuria. BMP and CBC were unremarkable except for her chronic thrombocytopenia. VPA level is 10. Dilantin level was 10.  Assessment/Plan: Seizure disorder with breakthrough seizures -Spoke with  neuro, Dr. Roseanne Reno who will see patient -defer adjustment of AEDs to neurology -MRI brain 06/11/2013 showed extensive right parieto-occipital encephalomalacia representing prior hemorrhage and postsurgical changes. There were no acute intracranial findings. Tobacco abuse -Tobacco cessation discussed -Patient did not want a Nicoderm patch Thrombocytopenia -Chronic -Likely due to medications -Continue to monitor History of intracranial bleed -Secondary to AVM Tremor / paresthesias  -Unclear etiology -Workup initiated as an outpatient with lumbar MRI which showed degenerative disc disease but no evidence of spinal stenosis or nerve root encroachment.  -B12--low normal at 328 on 06/12/2013 -Serum B12--239 06/10/2013. -Supplement B12  -further workup per neurology       Past Medical History  Diagnosis Date  . Seizures   . DVT (deep venous thrombosis)   . Asthma   . Cerebral hemorrhage   . Chronic back pain   . DDD (degenerative disc disease) 06/12/2013   Past Surgical History  Procedure Laterality Date  . Cesarean section    . Brain surgery      craniotomy with hematoma evacuation   Social History:  reports that she has been smoking Cigarettes.  She has a 10 pack-year smoking history. She has never used smokeless tobacco. She reports that she does not drink alcohol or use illicit drugs.   Family History  Problem Relation Age of Onset  . Heart attack Mother   . Heart attack Father      Allergies  Allergen Reactions  . Codeine Nausea And Vomiting  . Keppra [Levetiracetam] Other (See Comments)    Causes seizures.   [name] Rash      Prior to Admission medications   Medication Sig Start Date End Date Taking? Authorizing Provider  albuterol (PROVENTIL HFA;VENTOLIN  HFA) 108 (90 BASE) MCG/ACT inhaler Inhale 2 puffs into the lungs every 4 (four) hours as needed for wheezing or shortness of breath.    Yes Historical Provider, MD  lacosamide (VIMPAT) 200 MG TABS  tablet Take 1 tablet (200 mg total) by mouth 2 (two) times daily. 05/02/13  Yes Suanne Marker, MD  phenytoin (DILANTIN) 100 MG ER capsule Take 100 mg by mouth 2 (two) times daily.   Yes Historical Provider, MD  phenytoin (DILANTIN) 50 MG tablet Chew 1 tablet (50 mg total) by mouth daily. 06/17/13   York Spaniel, MD    Review of Systems:  Constitutional:  No weight loss, night sweats, Fevers, chills, fatigue.  Head&Eyes: No headache.  No vision loss.  No eye pain or scotoma ENT:  No Difficulty swallowing,Tooth/dental problems,Sore throat,   Cardio-vascular:  No chest pain, Orthopnea, PND, swelling in lower extremities,  dizziness, palpitations  GI:  No  abdominal pain, nausea, vomiting, diarrhea, loss of appetite, hematochezia, melena, Resp:  No shortness of breath with exertion or at rest. No cough. No coughing up of blood .No wheezing Skin:  no rash or lesions.  GU:  no dysuria, change in color of urine, no urgency or frequency. No flank pain.  Musculoskeletal:  No joint pain or swelling. No decreased range of motion. No back pain.  Psych:  No change in mood or affect. No depression or anxiety. Neurologic: No headache, no dysesthesia, no focal weakness, no vision loss. No syncope  Physical Exam: Filed Vitals:   06/17/13 0842 06/17/13 0850 06/17/13 1158 06/17/13 1308  BP:  102/52 105/42 112/69  Pulse:  80 95 85  Temp:    99.3 F (37.4 C)  TempSrc:    Oral  Resp:  10 22 14   SpO2: 99% 99% 100% 97%   General:  A&O x 3, NAD, nontoxic, pleasant/cooperative Head/Eye: No conjunctival hemorrhage, no icterus, [place]/AT, No nystagmus ENT:  No icterus,  No thrush, no pharyngeal exudate Neck:  No masses, no lymphadenpathy, no bruits CV:  RRR, no rub, no gallop, no S3 Lung:  Diminished breath sounds with scattered rales. No wheezing. Good air movement. Abdomen: soft/NT, +BS, nondistended, no peritoneal signs Ext: No cyanosis, No rashes, No petechiae, No lymphangitis, No  edema Neuro: CNII-XII intact, strength 4/5 in bilateral upper and lower extremities, no dysmetria.epicritic and protopathic sensations intact bilaterally   Labs on Admission:  Basic Metabolic Panel:  Recent Labs Lab 06/11/13 1530 06/12/13 0335 06/17/13 0833  NA 137 140 138  K 3.7 3.6 4.2  CL 102 106 102  CO2 26 28 27   GLUCOSE 119* 85 98  BUN 13 8 10   CREATININE 0.67 0.64 0.73  CALCIUM 9.2 9.5 9.7  MG 1.9  --   --    Liver Function Tests:  Recent Labs Lab 06/11/13 1530  AST 15  ALT 10  ALKPHOS 58  BILITOT 0.1*  PROT 6.3  ALBUMIN 3.7   No results found for this basename: LIPASE, AMYLASE,  in the last 168 hours No results found for this basename: AMMONIA,  in the last 168 hours CBC:  Recent Labs Lab 06/11/13 1530 06/12/13 0335 06/17/13 0833  WBC 4.0 4.3 4.5  NEUTROABS 2.0  --  3.2  HGB 12.9 13.3 13.2  HCT 38.7 39.5 39.1  MCV 103.5* 103.4* 103.4*  PLT 129* 122* 131*   Cardiac Enzymes: No results found for this basename: CKTOTAL, CKMB, CKMBINDEX, TROPONINI,  in the last 168 hours BNP: No components found with  this basename: POCBNP,  CBG: No results found for this basename: GLUCAP,  in the last 168 hours  Radiological Exams on Admission: No results found.      Time spent:60 minutes Code Status:   full Family Communication:   sister at bedside   Sabastion Hrdlicka, DO  [facility] Pager [phone]  If 7PM-7AM, please contact night-coverage [email or web address] Password [email or web address] 06/17/2013, 2:10 PM

## 2013-06-17 NOTE — Progress Notes (Signed)
Called to see pt by RN due to seizure--tonic-clonic activity witnessed by nurse.  Seizure lasted <34min.  Pt. was post-ictal when I saw her, but she was awake.  She denied any HA, CP, SOB, visual disturbance.  VS:  100/51-HR81 CV RRR Lung--CTA but diminished BS abd- soft/+BS/NT Neuro--moves all 4 extremities antigravity.  Follows commands and answers appropriately.  Spoke with Dr. Roseanne Reno.  Give Depacon 1000mg  x 1 then 500mg  bid.  DTat

## 2013-06-17 NOTE — Consult Note (Signed)
NEURO HOSPITALIST CONSULT NOTE    Reason for Consult: Seizure  HPI:                                                                                                                                          [name] is an 57 y.o. 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 At that time she was on dilantin 400 mg daily and vimpat  and stated that she had failed or had side effects to multiple anticonvulsants including Topamax, Depakote, keppra, phenobarbital, tegretol, and perhaps Lamictal by report only (reference [facility] note 07/26/2002 by Dr. Orlin [name]). She indicated that many of these medications actually increase her seizure frequency. Per patient she is currently undergoing extensive testing at [name] for possible epilepsy surgery.  She was discharged on 05/13/2013 with Vimpat 200mg  bid and phenytoin 100mg  am, 200mg  pm. Dr. Anne Hahn changed her phenytoin to 100mg  bid on 06/14/13. Patietn returns to the hospital after having 2 additional seizures. In emergency department, the patient had another witnessed seizure. Ativan was given. The patient was given Dilantin 350 mg IV x1. Dilantin level is 10 corrected is 11.9.   On arrival for consultation nurse states patient had just shown seizure activity that lasted for <1 minute.  She went to get Ativan but when returned patient was awake and showing no seizure activity.  Patient is aware she is in [facility] hospital and knows the date. She is able to follow my commands but is slightly drowsy. When asked about being placed on neurontin, she denies starting this medications. (Of note:  looking at notes from [facility] patient was placed on neuron tin 300 mg Q hs but this is not on her home med rec.).    Past Medical History  Diagnosis Date  . Seizures   . DVT (deep venous thrombosis)   . Asthma   . Cerebral hemorrhage   . Chronic back pain   . DDD (degenerative disc disease) 06/12/2013    Past Surgical History  Procedure Laterality Date  . Cesarean section    . Brain surgery      craniotomy with hematoma evacuation    Family History  Problem Relation Age of Onset  . Heart attack Mother   . Heart attack Father     Social History:  reports that she has been smoking Cigarettes.  She has a 10 pack-year smoking history. She has never used smokeless tobacco. She reports  that she does not drink alcohol or use illicit drugs.  Allergies  Allergen Reactions  . Codeine Nausea And Vomiting  . Keppra [Levetiracetam] Other (See Comments)    Causes seizures.   [name] Rash    MEDICATIONS:                                                                                                                     Scheduled: . enoxaparin (LOVENOX) injection  40 mg Subcutaneous Q24H  . lacosamide  200 mg Oral BID  . LORazepam      . phenytoin  100 mg Oral BID  . phenytoin  50 mg Oral BID  . sodium chloride  3 mL Intravenous Q12H  . valproate sodium  1,000 mg Intravenous Once  . valproate sodium  500 mg Intravenous Q12H  . vitamin B-12  1,000 mcg Oral Daily   Continuous:  ZOX:[id number], acetaminophen, albuterol, ondansetron (ZOFRAN) IV, ondansetron   ROS:                                                                                                                                       History obtained from the patient  General ROS: negative for - chills, fatigue, fever, night sweats, weight gain or weight loss Psychological ROS: negative for - behavioral disorder, hallucinations, memory difficulties, mood swings or suicidal ideation Ophthalmic ROS: negative for -  blurry vision, double vision, eye pain or loss of vision ENT ROS: negative for - epistaxis, nasal discharge, oral lesions, sore throat, tinnitus or vertigo Allergy and Immunology ROS: negative for - hives or itchy/watery eyes Hematological and Lymphatic ROS: negative for - bleeding problems, bruising or swollen lymph nodes Endocrine ROS: negative for - galactorrhea, hair pattern changes, polydipsia/polyuria or temperature intolerance Respiratory ROS: negative for - cough, hemoptysis, shortness of breath or wheezing Cardiovascular ROS: negative for - chest pain, dyspnea on exertion, edema or irregular heartbeat Gastrointestinal ROS: negative for - abdominal pain, diarrhea, hematemesis, nausea/vomiting or stool incontinence Genito-Urinary ROS: negative for - dysuria, hematuria, incontinence or urinary frequency/urgency Musculoskeletal ROS: negative for - joint swelling or muscular weakness Neurological ROS: as noted in HPI Dermatological ROS: negative for rash and skin lesion changes   Blood pressure 100/47, pulse 81, temperature 100.5 F (38.1 C), temperature source Oral, resp. rate 12, height 5\' 7"  (1.702 m), weight 54.1 kg (119 lb 4.3 oz), SpO2 96.00%.   Neurologic Examination:  Mental Status: Alert, oriented, thought content appropriate but slow.  Speech fluent without evidence of aphasia.  Able to follow 3 step commands without difficulty. Cranial Nerves: II: Discs flat bilaterally; Visual fields grossly normal, pupils equal, round, reactive to light and accommodation III,IV, VI: ptosis not present, extra-ocular motions intact bilaterally V,VII: smile symmetric, facial light touch sensation normal bilaterally VIII: hearing normal bilaterally IX,X: gag reflex present XI: bilateral shoulder shrug XII: midline tongue extension without atrophy or fasciculations  Motor: Right : Upper  extremity   5/5    Left:     Upper extremity   5/5  Lower extremity   5/5     Lower extremity   5/5 --she shows no tremor (as in previous notes there was tremor noted) Tone and bulk:normal tone throughout; no atrophy noted Sensory: Pinprick and light touch intact throughout, bilaterally Deep Tendon Reflexes:  Right: Upper Extremity   Left: Upper extremity   biceps (C-5 to C-6) 2/4   biceps (C-5 to C-6) 2/4 tricep (C7) 2/4    triceps (C7) 2/4 Brachioradialis (C6) 2/4  Brachioradialis (C6) 2/4  Lower Extremity Lower Extremity  quadriceps (L-2 to L-4) 2/4   quadriceps (L-2 to L-4) 2/4 Achilles (S1) 2/4   Achilles (S1) 2/4  Plantars: Right: downgoing   Left: downgoing Cerebellar: normal finger-to-nose,  normal heel-to-shin test Gait: not assessed CV: pulses palpable throughout    No components found with this basename: cbc,  bmp,  coags,  chol,  tri,  ldl,  hga1c    Results for orders placed during the hospital encounter of 06/17/13 (from the past 48 hour(s))  PHENYTOIN LEVEL, TOTAL     Status: None   Collection Time    06/17/13  8:33 AM      Result Value Range   Phenytoin Lvl 10.0  10.0 - 20.0 ug/mL   Comment: Performed at [facility]  VALPROIC ACID LEVEL     Status: Abnormal   Collection Time    06/17/13  8:33 AM      Result Value Range   Valproic Acid Lvl 10.0 (*) 50.0 - 100.0 ug/mL   Comment: Performed at [facility]  BASIC METABOLIC PANEL     Status: None   Collection Time    06/17/13  8:33 AM      Result Value Range   Sodium 138  135 - 145 mEq/L   Potassium 4.2  3.5 - 5.1 mEq/L   Chloride 102  96 - 112 mEq/L   CO2 27  19 - 32 mEq/L   Glucose, Bld 98  70 - 99 mg/dL   BUN 10  6 - 23 mg/dL   Creatinine, Ser 1.61  0.50 - 1.10 mg/dL   Calcium 9.7  8.4 - 09.6 mg/dL   GFR calc non Af Amer >90  >90 mL/min   GFR calc Af Amer >90  >90 mL/min   Comment: (NOTE)     The eGFR has been calculated using the CKD EPI equation.     This calculation has not been  validated in all clinical situations.     eGFR's persistently <90 mL/min signify possible Chronic Kidney     Disease.  CBC WITH DIFFERENTIAL     Status: Abnormal   Collection Time    06/17/13  8:33 AM      Result Value Range   WBC 4.5  4.0 - 10.5 K/uL   RBC 3.78 (*) 3.87 - 5.11 MIL/uL   Hemoglobin 13.2  12.0 -  15.0 g/dL   HCT 98.1  19.1 - 47.8 %   MCV 103.4 (*) 78.0 - 100.0 fL   MCH 34.9 (*) 26.0 - 34.0 pg   MCHC 33.8  30.0 - 36.0 g/dL   RDW 29.5  62.1 - 30.8 %   Platelets 131 (*) 150 - 400 K/uL   Neutrophils Relative % 72  43 - 77 %   Neutro Abs 3.2  1.7 - 7.7 K/uL   Lymphocytes Relative 23  12 - 46 %   Lymphs Abs 1.0  0.7 - 4.0 K/uL   Monocytes Relative 5  3 - 12 %   Monocytes Absolute 0.2  0.1 - 1.0 K/uL   Eosinophils Relative 0  0 - 5 %   Eosinophils Absolute 0.0  0.0 - 0.7 K/uL   Basophils Relative 0  0 - 1 %   Basophils Absolute 0.0  0.0 - 0.1 K/uL  URINALYSIS, ROUTINE W REFLEX MICROSCOPIC     Status: Abnormal   Collection Time    06/17/13 10:53 AM      Result Value Range   Color, Urine YELLOW  YELLOW   APPearance CLOUDY (*) CLEAR   Specific Gravity, Urine 1.019  1.005 - 1.030   pH 7.5  5.0 - 8.0   Glucose, UA NEGATIVE  NEGATIVE mg/dL   Hgb urine dipstick NEGATIVE  NEGATIVE   Bilirubin Urine NEGATIVE  NEGATIVE   Ketones, ur NEGATIVE  NEGATIVE mg/dL   Protein, ur NEGATIVE  NEGATIVE mg/dL   Urobilinogen, UA 0.2  0.0 - 1.0 mg/dL   Nitrite NEGATIVE  NEGATIVE   Leukocytes, UA NEGATIVE  NEGATIVE   Comment: MICROSCOPIC NOT DONE ON URINES WITH NEGATIVE PROTEIN, BLOOD, LEUKOCYTES, NITRITE, OR GLUCOSE <1000 mg/dL.  COMPREHENSIVE METABOLIC PANEL     Status: Abnormal   Collection Time    06/17/13  3:38 PM      Result Value Range   Sodium 138  135 - 145 mEq/L   Potassium 3.7  3.5 - 5.1 mEq/L   Chloride 103  96 - 112 mEq/L   CO2 27  19 - 32 mEq/L   Glucose, Bld 118 (*) 70 - 99 mg/dL   BUN 8  6 - 23 mg/dL   Creatinine, Ser 6.57  0.50 - 1.10 mg/dL   Calcium 8.8  8.4 - 84.6  mg/dL   Total Protein 6.5  6.0 - 8.3 g/dL   Albumin 3.9  3.5 - 5.2 g/dL   AST 20  0 - 37 U/L   ALT 15  0 - 35 U/L   Alkaline Phosphatase 63  39 - 117 U/L   Total Bilirubin 0.3  0.3 - 1.2 mg/dL   GFR calc non Af Amer >90  >90 mL/min   GFR calc Af Amer >90  >90 mL/min   Comment: (NOTE)     The eGFR has been calculated using the CKD EPI equation.     This calculation has not been validated in all clinical situations.     eGFR's persistently <90 mL/min signify possible Chronic Kidney     Disease.    No results found.  Assessment and plan per attending neurologist  Felicie Morn PA-C [facility] [phone]  06/17/2013, 4:43 PM   Assessment/Plan: 57 YO female with known seizure disorder presenting to ED with 2 seizures prior to arrival, one while in ED and one while on the floor. Admitting corrected Dilantin level 11.9.  While in ED she has received bolus of Dilantin 350 mg X1  at 11:48. Currently she is drowsy but awake and oriented. Order for 1000 mg VPA IV STAT has been placed followed by maintainance dose of 500 mg IV BID.    Recommend: 1) Continue Vimpat 200 mg BID 2) Continue Dilantin 150 mg BID with Dilantin level in am  3) Load with Depakote 1 gram followed by 500 mg BID (ordered) 4) Continue SZ precautions 5) will continue to follow   I personally participate in this patient's evaluation and management, including formulating the above assessment and management recommendations.  Venetia Maxon M.D. [facility] [phone]

## 2013-06-17 NOTE — ED Notes (Signed)
Pt SPO2 dropped to 88% on room air. Pt placed on 2L and now O2 sats 99%.

## 2013-06-17 NOTE — ED Provider Notes (Signed)
CSN: [id number]     Arrival date & time 06/17/13  0804 History   First MD Initiated Contact with Patient 06/17/13 [id number]     Chief Complaint  Patient presents with  . Seizures   (Consider location/radiation/quality/duration/timing/severity/associated sxs/prior Treatment) HPI Patient presents today via EMS as for seizure activity. 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 She reports she was up for the day and she normally gets a warning she's going to have a seizure. She states she feels "funny and weak". EMS was called after she had a seizure this morning. EMS gave her Zofran en route for nausea. She reports her nausea is gone now. She reports her seizure activity is very variable and she can have several on the same day or go a period of time with no seizure activity. She does report most recently her Dilantin level has been toxic and her doctor has been adjusting her Dilantin level. She denies any injury from her seizure today. She denies any preceding illness such as coughing, sore throat, nausea, vomiting. She states she had been taking her medications as directed.    PCP Dr Selena Batten Neurologist Dr Ruthine Dose  Past Medical History  Diagnosis Date  . Seizures   . DVT (deep venous thrombosis)   . Asthma   . Cerebral hemorrhage   . Chronic back pain   . DDD (degenerative disc disease) 06/12/2013   Past Surgical History  Procedure Laterality Date  . Cesarean section    . Brain surgery      craniotomy with hematoma evacuation   Family History  Problem Relation Age of Onset  . Heart attack Mother   . Heart attack Father    History  Substance Use Topics  . Smoking status: Current Every Day Smoker -- 0.50 packs/day for 20 years    Types: Cigarettes  . Smokeless tobacco: Never Used  . Alcohol Use: No   Lives at home Lives with mother On disability after brain surgery   OB History   Grav Para Term Preterm Abortions TAB SAB Ect  Mult Living                 Review of Systems  All other systems reviewed and are negative.    Allergies  Codeine; Keppra; and Olive oil  Home Medications   Current Outpatient Rx  Name  Route  Sig  Dispense  Refill  . albuterol (PROVENTIL HFA;VENTOLIN HFA) 108 (90 BASE) MCG/ACT inhaler   Inhalation   Inhale 2 puffs into the lungs every 4 (four) hours as needed for wheezing or shortness of breath.          . gabapentin (NEURONTIN) 300 MG capsule   Oral   Take 1 capsule (300 mg total) by mouth at bedtime.   30 capsule   2   . lacosamide (VIMPAT) 200 MG TABS tablet   Oral   Take 1 tablet (200 mg total) by mouth 2 (two) times daily.   60 tablet   1     Pharmacy Fax [phone]   . LORazepam (ATIVAN) 0.5 MG tablet   Oral   Take 1 tablet (0.5 mg total) by mouth every 8 (eight) hours as needed for anxiety or seizure.   30 tablet   0   . phenytoin (DILANTIN) 100 MG ER capsule      1 capsule twice daily   90 capsule   2    BP 100/51  Pulse 89  Temp(Src) 100.1 F (37.8 C) (Oral)  Resp 18  SpO2 92%  Vital signs normal except low grade temp  Physical Exam  Nursing note and vitals reviewed. Constitutional: She appears well-developed and well-nourished. She is cooperative.  HENT:  Head: Normocephalic and atraumatic.  Right Ear: External ear normal.  Left Ear: External ear normal.  Mouth/Throat: Oropharynx is clear and moist.  No trauma to tongue  Eyes: Conjunctivae and EOM are normal. Pupils are equal, round, and reactive to light.  Neck: Normal range of motion and full passive range of motion without pain. Neck supple.  Cardiovascular: Normal rate, regular rhythm and normal heart sounds.   Pulmonary/Chest: Effort normal and breath sounds normal. Not tachypneic. No respiratory distress.  Abdominal: Soft. Bowel sounds are normal. She exhibits no distension.  Musculoskeletal: Normal range of motion. She exhibits no edema and no tenderness.  Neurological: She has  normal strength and normal reflexes. No cranial nerve deficit.  Grips equal, no motor focal abnormality  Skin: Skin is warm, dry and intact.  Psychiatric: She has a normal mood and affect. Her speech is normal and behavior is normal. Thought content normal.    ED Course  Procedures (including critical care time)  Medications  phenytoin (DILANTIN) 350 mg in sodium chloride 0.9 % 100 mL IVPB (not administered)  sodium chloride 0.9 % bolus 500 mL (0 mLs Intravenous Stopped 06/17/13 1006)     Review of her prior labs shows on September 11 her Dilantin level was 4.6, on September 12 was 15.5, on September 24 was 17.1, November 7 was 32.8, on November 8 it was 27.1, on November 19 it was 48, and on November 10 it was 14.1  Sister here, states patient had 2 seizures yesterday, ? One during the night (her bed was wet, pt states she didn't make it to the bathroom and was incontinent) and one witnessed by patient's mother today.   11:29 Dr Anne Hahn, discussed her levels, he states she was toxic when taking 300 mg a day and now is subtherapeutic at 200 mg a day.  He wants her to get dilantin IV (300-400 mg) now and he is going to call in a 50 mg tablet of dilantin for her, she is to continue the 100 mg BID and add the 50 mg once a day. He is going to see her back in the office in about 10 days. Pt given information and she understand. She is continue the vimpat twice a day as she is already taking it.   11:50 called to see patient, she is having a generalized tonic-clonic seizure that stopped before ativan could be given, lasted about 1 minutes. Pt is just starting to get her dilantin infused. She was given ativan 0.5 mg IV.   13:07 Dr Roseanne Reno, states they will consult, have hospitalist admit.   13:20 Dr Tat admit to tele, observation  Labs Review  Results for orders placed during the hospital encounter of 06/17/13  PHENYTOIN LEVEL, TOTAL      Result Value Range   Phenytoin Lvl 10.0  10.0 - 20.0  ug/mL  VALPROIC ACID LEVEL      Result Value Range   Valproic Acid Lvl 10.0 (*) 50.0 - 100.0 ug/mL  BASIC METABOLIC PANEL      Result Value Range   Sodium 138  135 - 145 mEq/L   Potassium 4.2  3.5 - 5.1 mEq/L   Chloride 102  96 - 112 mEq/L   CO2 27  19 - 32 mEq/L   Glucose, Bld 98  70 - 99 mg/dL   BUN 10  6 - 23 mg/dL   Creatinine, Ser 2.95  0.50 - 1.10 mg/dL   Calcium 9.7  8.4 - 62.1 mg/dL   GFR calc non Af Amer >90  >90 mL/min   GFR calc Af Amer >90  >90 mL/min  CBC WITH DIFFERENTIAL      Result Value Range   WBC 4.5  4.0 - 10.5 K/uL   RBC 3.78 (*) 3.87 - 5.11 MIL/uL   Hemoglobin 13.2  12.0 - 15.0 g/dL   HCT 30.8  65.7 - 84.6 %   MCV 103.4 (*) 78.0 - 100.0 fL   MCH 34.9 (*) 26.0 - 34.0 pg   MCHC 33.8  30.0 - 36.0 g/dL   RDW 96.2  95.2 - 84.1 %   Platelets 131 (*) 150 - 400 K/uL   Neutrophils Relative % 72  43 - 77 %   Neutro Abs 3.2  1.7 - 7.7 K/uL   Lymphocytes Relative 23  12 - 46 %   Lymphs Abs 1.0  0.7 - 4.0 K/uL   Monocytes Relative 5  3 - 12 %   Monocytes Absolute 0.2  0.1 - 1.0 K/uL   Eosinophils Relative 0  0 - 5 %   Eosinophils Absolute 0.0  0.0 - 0.7 K/uL   Basophils Relative 0  0 - 1 %   Basophils Absolute 0.0  0.0 - 0.1 K/uL  URINALYSIS, ROUTINE W REFLEX MICROSCOPIC      Result Value Range   Color, Urine YELLOW  YELLOW   APPearance CLOUDY (*) CLEAR   Specific Gravity, Urine 1.019  1.005 - 1.030   pH 7.5  5.0 - 8.0   Glucose, UA NEGATIVE  NEGATIVE mg/dL   Hgb urine dipstick NEGATIVE  NEGATIVE   Bilirubin Urine NEGATIVE  NEGATIVE   Ketones, ur NEGATIVE  NEGATIVE mg/dL   Protein, ur NEGATIVE  NEGATIVE mg/dL   Urobilinogen, UA 0.2  0.0 - 1.0 mg/dL   Nitrite NEGATIVE  NEGATIVE   Leukocytes, UA NEGATIVE  NEGATIVE   Laboratory interpretation all normal except  subtherapeutic dilantin level   Imaging Review No results found.  Ct Head Wo Contrast  06/11/2013     IMPRESSION: No change when compared to prior study. No acute findings.   Electronically  Signed   By: Esperanza Heir M.D.   On: 06/11/2013 15:57   Mr [name] LK Contrast  06/11/2013   IMPRESSION: Extensive right parieto-occipital encephalomalacia representing prior hemorrhage and postsurgical change. No acute intracranial findings. Similar appearance to prior CT and MR.   Electronically Signed   By: Davonna Belling M.D.   On: 06/11/2013 21:10   Mr [name]  06/05/2013    IMPRESSION: 1. No acute findings or explanation for the patient's symptoms. 2. Mild thoracolumbar degenerative changes with disc bulging and facet disease. No significant spinal stenosis or nerve root encroachment.   Electronically Signed   By: Roxy Horseman M.D.   On: 06/05/2013 13:16   EKG Interpretation   None       MDM   1. Seizure   2. Subtherapeutic serum dilantin level     Plan admission   Devoria Albe, MD, Franz Dell, MD 06/17/13 [phone]

## 2013-06-17 NOTE — ED Notes (Signed)
Bed: WA25 Expected date:  Expected time:  Means of arrival:  Comments: EMS-SZ 

## 2013-06-17 NOTE — Progress Notes (Signed)
Called to room by Shanda Bumps NT that patient was having another seizure. It lasted less than 45 seconds. Vitals stable. Paged Dr. Arbutus Leas. He asked that Neurology be paged. Paged Dr. Petra Kuba. He advised to give 1 mg ativan now. Erskin Burnet RN

## 2013-06-17 NOTE — ED Notes (Signed)
MD I. Knapp at bedside. 

## 2013-06-17 NOTE — Progress Notes (Signed)
Called to room by [employer] who walked by room and noted patient to be having seizure. As Shanda Bumps RN stayed with patient obtained ativan but did not administer as patient's seizure had stopped. Seizure time from the time it was noticed to end was around a minute. Dr. Arbutus Leas paged to room and assessed patient. Vitals stable. Patient is lethargic but arousable and can answer questions.  Patient is in seizure precautions that were already place with padded side rails and oxygen. Will continue to monitor patient.  Erskin Burnet RN

## 2013-06-17 NOTE — Telephone Encounter (Signed)
The patient went to the ER today with several seizures. The Dilantin level was toxic recently, the dose was reduced to 100 mg twice daily. The patient remains on Vimpat and Dilantin. The patient is off of Depakote. I'll add a 50 mg tablet of the Dilantin, as her Dilantin level today was 10. The patient will need to be seen in our office within the next 2 weeks.

## 2013-06-17 NOTE — ED Notes (Signed)
Pt aware of the need for a urine sample. Pt unable to void at this time. 

## 2013-06-17 NOTE — ED Notes (Addendum)
Per [facility] pt had a witnessed seizure today and several last night. Pt alert and oriented at this time. Pt had toxic dilantin levels and PCP decreased dose. [facility] gave 4mg  of Zofran via 20 g left hand IV

## 2013-06-17 NOTE — Care Management Note (Addendum)
    Page 1 of 1   06/20/2013     12:20:41 PM   CARE MANAGEMENT NOTE 06/20/2013  Patient:  [name], [name]   Account Number:  [id number]  Date Initiated:  06/17/2013  Documentation initiated by:  Lanier Clam  Subjective/Objective Assessment:   57 Y/O F ADMITTED W/SEIZURE.ZO:XWRUEAV.READMIT 11/8-11/10.     Action/Plan:   FROM HOME.HAS PCP,PHARMACY.   Anticipated DC Date:  06/20/2013   Anticipated DC Plan:  HOME/SELF CARE      DC Planning Services  CM consult      Choice offered to / List presented to:             Status of service:  Completed, signed off Medicare Important Message given?   (If response is "NO", the following Medicare IM given date fields will be blank) Date Medicare IM given:   Date Additional Medicare IM given:    Discharge Disposition:  HOME/SELF CARE  Per UR Regulation:  Reviewed for med. necessity/level of care/duration of stay  If discussed at Long Length of Stay Meetings, dates discussed:    Comments:  06/20/13 Lurleen Soltero RN,BSN NCM [phone] D/C HOME NO NEEDS OR ORDERS.  06/17/13 Braddock Servellon RN,BSN NCM [phone]

## 2013-06-17 NOTE — Telephone Encounter (Signed)
I LM at home # for appt made next week, 06-21-13 at 0930 with LLam, NP

## 2013-06-17 NOTE — Telephone Encounter (Signed)
Pts family member called re: needing appt , pt in hospital ED for seizures again.

## 2013-06-17 NOTE — ED Notes (Signed)
MD at bedside. 

## 2013-06-17 NOTE — ED Notes (Signed)
Hospitalist bedside 

## 2013-06-18 LAB — CBC
Hemoglobin: 12.7 g/dL (ref 12.0–15.0)
MCH: 34.5 pg — ABNORMAL HIGH (ref 26.0–34.0)
MCHC: 33.3 g/dL (ref 30.0–36.0)
Platelets: 138 10*3/uL — ABNORMAL LOW (ref 150–400)
RDW: 12.7 % (ref 11.5–15.5)
WBC: 5.5 10*3/uL (ref 4.0–10.5)

## 2013-06-18 LAB — BASIC METABOLIC PANEL
BUN: 9 mg/dL (ref 6–23)
Calcium: 9 mg/dL (ref 8.4–10.5)
Creatinine, Ser: 0.7 mg/dL (ref 0.50–1.10)
GFR calc Af Amer: >90 mL/min (ref >90)
GFR calc non Af Amer: >90 mL/min (ref >90)
Glucose, Bld: 104 mg/dL — ABNORMAL HIGH (ref 70–99)
Potassium: 3.8 mEq/L (ref 3.5–5.1)

## 2013-06-18 LAB — PHENYTOIN LEVEL, TOTAL: Phenytoin Lvl: 10.8 ug/mL (ref 10.0–20.0)

## 2013-06-18 LAB — VALPROIC ACID LEVEL: Valproic Acid Lvl: 28.4 ug/mL — ABNORMAL LOW (ref 50.0–100.0)

## 2013-06-18 NOTE — Progress Notes (Signed)
Subjective: Patient had no new complaints. No seizure reported overnight.  Objective: Current vital signs: BP 98/59  Pulse 80  Temp(Src) 98.4 F (36.9 C) (Oral)  Resp 18  Ht 5\' 7"  (1.702 m)  Wt 54.1 kg (119 lb 4.3 oz)  BMI 18.68 kg/m2  SpO2 99%  Neurologic Exam: Alert and in no acute distress. Mental status was normal. Speech was normal.  Medications: I have reviewed the patient's current medications.  Assessment/Plan: Recurrent focal seizures with secondary generalization, currently controlled over night with combination of Dilantin, Vimpat and the addition of valproic acid. Corrected Dilantin level this morning was 12.3. She's tolerating P valproic acid well, so far.  I would like to manage this patient with 24 hours during which hopefully she will remain seizure free and can subsequently return home.  And making no changes in current management for now.  C.R. Roseanne Reno, MD [facility] [phone]  06/18/2013  4:05 PM

## 2013-06-18 NOTE — Progress Notes (Signed)
[facility] PROGRESS NOTE  [name]:[id number] DOB: [birth year] DOA: 06/17/2013 PCP: Pearson Grippe, MD  Assessment/Plan: Seizure disorder with breakthrough seizures  -appreciate neurology input -defer adjustment of AEDs to neurology  -MRI brain 06/11/2013 showed extensive right parieto-occipital encephalomalacia representing prior hemorrhage and postsurgical changes. There were no acute intracranial findings.  -Continue Depakote, Dilantin, Vimpat Tobacco abuse  -Tobacco cessation discussed  -Patient did not want a Nicoderm patch  Thrombocytopenia  -Chronic  -Likely due to medications  -Continue to monitor  History of intracranial bleed  -Secondary to AVM  Tremor / paresthesias/head titubation -Unclear etiology  -Workup initiated as an outpatient with lumbar MRI which showed degenerative disc disease but no evidence of spinal stenosis or nerve root encroachment.  -B12--low normal at 328 on 06/12/2013  -Serum B12--239 06/10/2013.  -Supplement B12  -further workup per neurology Macrocytic anemia -Patient has low normal B12 which is being supplemented -Check RBC folate  Family Communication:   mom at beside Disposition Plan:   Home when medically stable      Procedures/Studies: Ct Head Wo [name]  06/11/2013   CLINICAL DATA:  Seizure history, history of brain surgery, complaining of upper body tremors  EXAM: CT HEAD WITHOUT [name]  TECHNIQUE: Contiguous axial images were obtained from the base of the skull through the vertex without intravenous [name].  COMPARISON:  04/27/2013  FINDINGS: Status post right craniotomy. Large area of encephalomalacia right parieto-occipital lobe, unchanged. No hemorrhage or acute infarct. No extra-axial fluid. Stable ex vacuo dilatation right ventricle occipital horn.  IMPRESSION: No change when compared to prior study. No acute findings.   Electronically Signed   By: Esperanza Heir M.D.   On: 06/11/2013 15:57   [name]  [name]  06/11/2013   CLINICAL DATA:  History of recurrent generalized tonic-clonic seizures, with persistent seizures today. History of hemorrhage and subsequent surgery for AVM.  EXAM: MRI HEAD WITHOUT AND WITH [name]  TECHNIQUE: Multiplanar, multiecho pulse sequences of the brain and surrounding structures were obtained without and with intravenous [name].  COMPARISON:  CT head earlier today. Most recent [name] brain 08/16/2010. Also prior CT scan 04/10/2004.  FINDINGS: No restricted diffusion to suggest acute stroke or visible seizure focus. Extensive right parieto-occipital encephalomalacia representing prior hemorrhage and postsurgical change, unchanged from priors.  Minimal cerebral and cerebellar atrophy. Mild subcortical and periventricular T2 and FLAIR hyperintensities, likely chronic microvascular ischemic change. No mass lesion, hydrocephalus, or extra-axial fluid.  Chronic hemorrhage at the surgical site. View flow voids are maintained in the carotid, basilar, and vertebral arteries.  Post infusion, no abnormal enhancement of the brain or meninges. Mild dural enhancement at the surgical site is postoperative in nature. No visible residual enhancing vascular nidus. No evidence for venous sinus thrombosis.  Normal pituitary, cerebellar tonsils, and upper cervical region. No osseous lesions. Negative orbits. No acute sinus disease. Mild left mastoid fluid similar to priors.  IMPRESSION: Extensive right parieto-occipital encephalomalacia representing prior hemorrhage and postsurgical change. No acute intracranial findings. Similar appearance to prior CT and [name].   Electronically Signed   By: Davonna Belling M.D.   On: 06/11/2013 21:10   [name]  06/05/2013   CLINICAL DATA:  Bilateral foot numbness for 3 days. History of seizures.  EXAM: MRI LUMBAR SPINE WITHOUT [name]  TECHNIQUE: Multiplanar, multisequence [name] imaging was performed. No intravenous [name] was administered.   COMPARISON:  Radiographs 07/23/2011. CT 05/20/2010.  FINDINGS: Prior studies demonstrate 5 lumbar-type vertebral bodies. The alignment is  normal. There is no evidence of fracture or pars defect. There are scattered endplate degenerative changes and Schmorl's nodes, similar to prior CT.  The conus medullaris extends to the L1-2 level and appears normal. No paraspinal abnormalities are identified. Dilatation of the thoracic duct in the right retrocrural area is unchanged from prior CT.  There is mild disc bulging with anterior osteophyte formation at T11-12.  There are no significant disc space findings at T12-L1 or L1-2.  L2-3: Mild disc bulging and facet hypertrophy. No spinal stenosis or nerve root encroachment.  L3-4: Mild disc bulging with mild facet and ligamentous hypertrophy. There is minimal narrowing of the left lateral recess without definite nerve root encroachment. The foramina are patent.  L4-5: Mild disc bulging with mild facet and ligamentous hypertrophy. No significant spinal stenosis or nerve root encroachment.  L5-S1: Disc height and hydration are maintained. There is mild bilateral facet hypertrophy. No spinal stenosis or nerve root encroachment.  IMPRESSION: 1. No acute findings or explanation for the patient's symptoms. 2. Mild thoracolumbar degenerative changes with disc bulging and facet disease. No significant spinal stenosis or nerve root encroachment.   Electronically Signed   By: Roxy Horseman M.D.   On: 06/05/2013 13:16         Subjective: Patient is feeling better today. She is more lucid. She denies any headache, visual disturbance, focal extremity weakness, chest pain, shortness of breath, nausea, vomiting, diarrhea, abdominal pain.  Objective: Filed Vitals:   06/17/13 1858 06/17/13 2221 06/17/13 2223 06/18/13 0343  BP: 100/41 92/45  101/47  Pulse: 76 74  78  Temp:  99.9 F (37.7 C)  98.2 F (36.8 C)  TempSrc:  Oral  Oral  Resp:  16  20  Height:      Weight:       SpO2:  94% 97% 99%    Intake/Output Summary (Last 24 hours) at 06/18/13 1214 Last data filed at 06/18/13 0700  Gross per 24 hour  Intake    290 ml  Output      0 ml  Net    290 ml   Weight change:  Exam:   General:  Pt is alert, follows commands appropriately, not in acute distress  HEENT: No icterus, No thrush,  [place]/AT  Cardiovascular: RRR, S1/S2, no rubs, no gallops  Respiratory: Diminished breath sounds bilaterally without any wheezing. Good air movement.  Abdomen: Soft/+BS, non tender, non distended, no guarding  Extremities: No edema, No lymphangitis, No petechiae, No rashes, no synovitis  Data Reviewed: Basic Metabolic Panel:  Recent Labs Lab 06/11/13 1530 06/12/13 0335 06/17/13 0833 06/17/13 1538 06/18/13 0510  NA 137 140 138 138 140  K 3.7 3.6 4.2 3.7 3.8  CL 102 106 102 103 106  CO2 26 28 27 27 29   GLUCOSE 119* 85 98 118* 104*  BUN 13 8 10 8 9   CREATININE 0.67 0.64 0.73 0.65 0.70  CALCIUM 9.2 9.5 9.7 8.8 9.0  MG 1.9  --   --   --   --    Liver Function Tests:  Recent Labs Lab 06/11/13 1530 06/17/13 1538  AST 15 20  ALT 10 15  ALKPHOS 58 63  BILITOT 0.1* 0.3  PROT 6.3 6.5  ALBUMIN 3.7 3.9   No results found for this basename: LIPASE, AMYLASE,  in the last 168 hours No results found for this basename: AMMONIA,  in the last 168 hours CBC:  Recent Labs Lab 06/11/13 1530 06/12/13 0335 06/17/13 0833 06/18/13 0510  WBC 4.0 4.3 4.5 5.5  NEUTROABS 2.0  --  3.2  --   HGB 12.9 13.3 13.2 12.7  HCT 38.7 39.5 39.1 38.1  MCV 103.5* 103.4* 103.4* 103.5*  PLT 129* 122* 131* 138*   Cardiac Enzymes: No results found for this basename: CKTOTAL, CKMB, CKMBINDEX, TROPONINI,  in the last 168 hours BNP: No components found with this basename: POCBNP,  CBG: No results found for this basename: GLUCAP,  in the last 168 hours  Recent Results (from the past 240 hour(s))  MRSA PCR SCREENING     Status: None   Collection Time    06/11/13 10:00 PM       Result Value Range Status   MRSA by PCR NEGATIVE  NEGATIVE Final   Comment:            The GeneXpert MRSA Assay (FDA     approved for NASAL specimens     only), is one component of a     comprehensive MRSA colonization     surveillance program. It is not     intended to diagnose MRSA     infection nor to guide or     monitor treatment for     MRSA infections.     Scheduled Meds: . enoxaparin (LOVENOX) injection  40 mg Subcutaneous Q24H  . lacosamide  200 mg Oral BID  . phenytoin  100 mg Oral BID  . phenytoin  50 mg Oral BID  . sodium chloride  3 mL Intravenous Q12H  . valproate sodium  500 mg Intravenous Q12H  . vitamin B-12  1,000 mcg Oral Daily   Continuous Infusions:    Eyal Greenhaw, DO  [facility] Pager [phone]  If 7PM-7AM, please contact night-coverage [email or web address] Password [email or web address] 06/18/2013, 12:14 PM   LOS: 1 day

## 2013-06-19 DIAGNOSIS — I82409 Acute embolism and thrombosis of unspecified deep veins of unspecified lower extremity: Secondary | ICD-10-CM

## 2013-06-19 DIAGNOSIS — M79609 Pain in unspecified limb: Secondary | ICD-10-CM

## 2013-06-19 LAB — BASIC METABOLIC PANEL
BUN: 11 mg/dL (ref 6–23)
Creatinine, Ser: 0.62 mg/dL (ref 0.50–1.10)
GFR calc Af Amer: >90 mL/min (ref >90)
GFR calc non Af Amer: >90 mL/min (ref >90)
Glucose, Bld: 95 mg/dL (ref 70–99)
Potassium: 3.5 mEq/L (ref 3.5–5.1)

## 2013-06-19 LAB — PROTIME-INR: INR: 1.02 (ref 0.00–1.49)

## 2013-06-19 MED ORDER — PATIENT'S GUIDE TO USING COUMADIN BOOK
Freq: Once | Status: AC
  Administered 2013-06-19: 20:00:00
  Filled 2013-06-19: qty 1

## 2013-06-19 MED ORDER — OXYCODONE-ACETAMINOPHEN 5-325 MG PO TABS
1.0000 | ORAL_TABLET | ORAL | Status: DC | PRN
  Administered 2013-06-19: 1 via ORAL
  Filled 2013-06-19: qty 1

## 2013-06-19 MED ORDER — WARFARIN VIDEO
Freq: Once | Status: AC
  Administered 2013-06-19: 20:00:00

## 2013-06-19 MED ORDER — WARFARIN SODIUM 7.5 MG PO TABS
7.5000 mg | ORAL_TABLET | Freq: Once | ORAL | Status: AC
  Administered 2013-06-19: 7.5 mg via ORAL
  Filled 2013-06-19: qty 1

## 2013-06-19 MED ORDER — WARFARIN - PHARMACIST DOSING INPATIENT: Freq: Every day | Status: DC

## 2013-06-19 MED ORDER — ENOXAPARIN SODIUM 60 MG/0.6ML ~~LOC~~ SOLN
1.0000 mg/kg | Freq: Two times a day (BID) | SUBCUTANEOUS | Status: DC
  Administered 2013-06-19 – 2013-06-20 (×3): 55 mg via SUBCUTANEOUS
  Filled 2013-06-19 (×4): qty 0.6

## 2013-06-19 NOTE — Progress Notes (Signed)
[facility] PROGRESS NOTE  [name]:[id number] DOB: [birth year] DOA: 06/17/2013 PCP: Pearson Grippe, MD  Assessment/Plan: Seizure disorder with breakthrough seizures  -no further seizures last 24-48hrs -appreciate neurology input  -defer adjustment of AEDs to neurology  -MRI brain 06/11/2013 showed extensive right parieto-occipital encephalomalacia representing prior hemorrhage and postsurgical changes. There were no acute intracranial findings.  -Continue Depakote, Dilantin, Vimpat  LLE DVT -Venous duplex positive for deep vein thrombosis of varying stages throughout the left lower extremity -start coumadin with lovenox bridge -Risks, benefits, and alternatives including but not limited to life-threatening bleed and recurrent intracranial hemorrhage were discussed with the patient. She expressed understanding and increased followup treatment protocols. 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  -B12--low normal at 328 on 06/12/2013  -Serum B12--239 06/10/2013.  -Supplement B12  -further workup per neurology  Macrocytic anemia  -Patient has low normal B12 which is being supplemented  -RBC folate599 Family Communication: spoke with mother Disposition Plan: Home when medically stable         Procedures/Studies: Ct Head Wo Contrast  06/11/2013   CLINICAL DATA:  Seizure history, history of brain surgery, complaining of upper body tremors  EXAM: CT HEAD WITHOUT CONTRAST  TECHNIQUE: Contiguous axial images were obtained from the base of the skull through the vertex without intravenous  contrast.  COMPARISON:  04/27/2013  FINDINGS: Status post right craniotomy. Large area of encephalomalacia right parieto-occipital lobe, unchanged. No hemorrhage or acute infarct. No extra-axial fluid. Stable ex vacuo dilatation right ventricle occipital horn.  IMPRESSION: No change when compared to prior study. No acute findings.   Electronically Signed   By: Esperanza Heir M.D.   On: 06/11/2013 15:57   Mr [name] Contrast  06/11/2013   CLINICAL DATA:  History of recurrent generalized tonic-clonic seizures, with persistent seizures today. History of hemorrhage and subsequent surgery for AVM.  EXAM: MRI HEAD WITHOUT AND WITH CONTRAST  TECHNIQUE: Multiplanar, multiecho pulse sequences of the brain and surrounding structures were obtained without and with intravenous contrast.  COMPARISON:  CT head earlier today. Most recent MR brain 08/16/2010. Also prior CT scan 04/10/2004.  FINDINGS: No restricted diffusion to suggest acute stroke or visible seizure focus. Extensive right parieto-occipital encephalomalacia representing prior hemorrhage and postsurgical change, unchanged from priors.  Minimal cerebral and cerebellar atrophy. Mild subcortical and periventricular T2 and FLAIR hyperintensities, likely chronic microvascular ischemic change. No mass lesion, hydrocephalus, or extra-axial fluid.  Chronic hemorrhage at the surgical site. View flow voids are maintained in the carotid, basilar, and vertebral arteries.  Post infusion, no abnormal enhancement of the brain or meninges. Mild dural enhancement at the surgical site is postoperative in nature. No visible residual enhancing vascular nidus. No evidence for venous sinus thrombosis.  Normal pituitary, cerebellar tonsils, and upper cervical region. No osseous lesions. Negative orbits. No acute sinus disease. Mild left mastoid fluid similar to priors.  IMPRESSION: Extensive right parieto-occipital encephalomalacia representing prior hemorrhage and postsurgical  change. No acute intracranial findings. Similar appearance to prior CT and MR.   Electronically Signed   By: Davonna Belling M.D.   On: 06/11/2013 21:10   Mr Lumbar  Spine Wo Contrast  06/05/2013   CLINICAL DATA:  Bilateral foot numbness for 3 days. History of seizures.  EXAM: MRI LUMBAR SPINE WITHOUT CONTRAST  TECHNIQUE: Multiplanar, multisequence MR imaging was performed. No intravenous contrast was administered.  COMPARISON:  Radiographs 07/23/2011. CT 05/20/2010.  FINDINGS: Prior studies demonstrate 5 lumbar-type vertebral bodies. The alignment is normal. There is no evidence of fracture or pars defect. There are scattered endplate degenerative changes and Schmorl's nodes, similar to prior CT.  The conus medullaris extends to the L1-2 level and appears normal. No paraspinal abnormalities are identified. Dilatation of the thoracic duct in the right retrocrural area is unchanged from prior CT.  There is mild disc bulging with anterior osteophyte formation at T11-12.  There are no significant disc space findings at T12-L1 or L1-2.  L2-3: Mild disc bulging and facet hypertrophy. No spinal stenosis or nerve root encroachment.  L3-4: Mild disc bulging with mild facet and ligamentous hypertrophy. There is minimal narrowing of the left lateral recess without definite nerve root encroachment. The foramina are patent.  L4-5: Mild disc bulging with mild facet and ligamentous hypertrophy. No significant spinal stenosis or nerve root encroachment.  L5-S1: Disc height and hydration are maintained. There is mild bilateral facet hypertrophy. No spinal stenosis or nerve root encroachment.  IMPRESSION: 1. No acute findings or explanation for the patient's symptoms. 2. Mild thoracolumbar degenerative changes with disc bulging and facet disease. No significant spinal stenosis or nerve root encroachment.   Electronically Signed   By: Roxy Horseman M.D.   On: 06/05/2013 13:16         Subjective: Patient complaint of some left  leg heaviness. No seizures overnight. Denies any fevers, chills, chest discomfort, shortness breath, nausea, vomiting, diarrhea, abdominal pain, dysuria and hematuria, headache, visual disturbance.  Objective: Filed Vitals:   06/18/13 1457 06/18/13 2133 06/19/13 0457 06/19/13 1259  BP: 98/59 126/93 100/52 124/50  Pulse: 80 72 76 76  Temp: 98.4 F (36.9 C) 98.3 F (36.8 C) 97.6 F (36.4 C) 98.1 F (36.7 C)  TempSrc: Oral Oral Oral Oral  Resp: 18 18 18    Height:      Weight:      SpO2: 99% 99% 100% 99%    Intake/Output Summary (Last 24 hours) at 06/19/13 1754 Last data filed at 06/19/13 1606  Gross per 24 hour  Intake   1373 ml  Output   2500 ml  Net  -1127 ml   Weight change:  Exam:   General:  Pt is alert, follows commands appropriately, not in acute distress  HEENT: No icterus, No thrush,  [place]/AT  Cardiovascular: RRR, S1/S2, no rubs, no gallops  Respiratory: Bilateral diminished breath sounds with scattered rales. No wheezing. Good air movement.  Abdomen: Soft/+BS, non tender, non distended, no guarding  Extremities: No edema, No lymphangitis, No petechiae, No rashes, no synovitis  Data Reviewed: Basic Metabolic Panel:  Recent Labs Lab 06/17/13 0833 06/17/13 1538 06/18/13 0510 06/19/13 0513  NA 138 138 140 142  K 4.2 3.7 3.8 3.5  CL 102 103 106 105  CO2 27 27 29 28   GLUCOSE 98 118* 104* 95  BUN 10 8 9 11   CREATININE 0.73 0.65 0.70 0.62  CALCIUM 9.7 8.8 9.0 9.1   Liver Function Tests:  Recent Labs Lab 06/17/13 1538  AST 20  ALT 15  ALKPHOS 63  BILITOT 0.3  PROT 6.5  ALBUMIN 3.9   No results found for this basename: LIPASE, AMYLASE,  in the last  168 hours No results found for this basename: AMMONIA,  in the last 168 hours CBC:  Recent Labs Lab 06/17/13 0833 06/18/13 0510  WBC 4.5 5.5  NEUTROABS 3.2  --   HGB 13.2 12.7  HCT 39.1 38.1  MCV 103.4* 103.5*  PLT 131* 138*   Cardiac Enzymes: No results found for this basename: CKTOTAL,  CKMB, CKMBINDEX, TROPONINI,  in the last 168 hours BNP: No components found with this basename: POCBNP,  CBG: No results found for this basename: GLUCAP,  in the last 168 hours  Recent Results (from the past 240 hour(s))  MRSA PCR SCREENING     Status: None   Collection Time    06/11/13 10:00 PM      Result Value Range Status   MRSA by PCR NEGATIVE  NEGATIVE Final   Comment:            The GeneXpert MRSA Assay (FDA     approved for NASAL specimens     only), is one component of a     comprehensive MRSA colonization     surveillance program. It is not     intended to diagnose MRSA     infection nor to guide or     monitor treatment for     MRSA infections.     Scheduled Meds: . enoxaparin (LOVENOX) injection  1 mg/kg Subcutaneous Q12H  . lacosamide  200 mg Oral BID  . phenytoin  100 mg Oral BID  . phenytoin  50 mg Oral BID  . sodium chloride  3 mL Intravenous Q12H  . valproate sodium  500 mg Intravenous Q12H  . vitamin B-12  1,000 mcg Oral Daily   Continuous Infusions:    Subrena Devereux, DO  [facility] Pager [phone]  If 7PM-7AM, please contact night-coverage [email or web address] Password [email or web address] 06/19/2013, 5:54 PM   LOS: 2 days

## 2013-06-19 NOTE — Progress Notes (Signed)
ANTICOAGULATION CONSULT NOTE - Initial Consult  Pharmacy Consult for Coumadin Indication: DVT  Allergies  Allergen Reactions  . Codeine Nausea And Vomiting  . Keppra [Levetiracetam] Other (See Comments)    Causes seizures.   [name] Rash    Patient Measurements: Height: 5\' 7"  (170.2 cm) Weight: 119 lb 4.3 oz (54.1 kg) IBW/kg (Calculated) : 61.6   Vital Signs: Temp: 98.1 F (36.7 C) (11/16 1259) Temp src: Oral (11/16 1259) BP: 124/50 mmHg (11/16 1259) Pulse Rate: 76 (11/16 1259)  Labs:  Recent Labs  06/17/13 0833 06/17/13 1538 06/18/13 0510 06/19/13 0513 06/19/13 1750  HGB 13.2  --  12.7  --   --   HCT 39.1  --  38.1  --   --   PLT 131*  --  138*  --   --   LABPROT  --   --   --   --  13.2  INR  --   --   --   --  1.02  CREATININE 0.73 0.65 0.70 0.62  --     Estimated Creatinine Clearance: 67.9 ml/min (by C-G formula based on Cr of 0.62).   Medical History: Past Medical History  Diagnosis Date  . Seizures   . DVT (deep venous thrombosis)   . Asthma   . Cerebral hemorrhage   . Chronic back pain   . DDD (degenerative disc disease) 06/12/2013    Medications:  Prescriptions prior to admission  Medication Sig Dispense Refill  . albuterol (PROVENTIL HFA;VENTOLIN HFA) 108 (90 BASE) MCG/ACT inhaler Inhale 2 puffs into the lungs every 4 (four) hours as needed for wheezing or shortness of breath.       . lacosamide (VIMPAT) 200 MG TABS tablet Take 1 tablet (200 mg total) by mouth 2 (two) times daily.  60 tablet  1  . phenytoin (DILANTIN) 100 MG ER capsule Take 100 mg by mouth 2 (two) times daily.      . phenytoin (DILANTIN) 50 MG tablet Chew 1 tablet (50 mg total) by mouth daily.  30 tablet  5   Scheduled:  . enoxaparin (LOVENOX) injection  1 mg/kg Subcutaneous Q12H  . lacosamide  200 mg Oral BID  . phenytoin  100 mg Oral BID  . phenytoin  50 mg Oral BID  . sodium chloride  3 mL Intravenous Q12H  . valproate sodium  500 mg Intravenous Q12H  . vitamin  B-12  1,000 mcg Oral Daily    Assessment: 57yo F admitted with seizures. Dopplers revealed a LLE DVT. Lovenox was started and pharmacy was asked to dose Coumadin.  Per CHEST guidelines, will need a 5 day overlap of Lovenox and Coumadin and until INR is therapeutic x 24hrs.  Baseline INR wnl.  CBC ok yesterday.  Goal of Therapy:  INR 2-3 Monitor platelets by anticoagulation protocol: Yes   Plan:   Give Coumadin 7.5mg  tonight.  Check PT/INR daily.  Provide Coumadin education.  Charolotte Eke, PharmD, pager [phone]. 06/19/2013,6:52 PM.

## 2013-06-19 NOTE — Progress Notes (Signed)
Received preliminary report regarding DVTs in LLE with varying degrees of acuity throughout the LLE.  I spoke with the pt and her mother and explained the risks of anticoagulation vs IVC filter.  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  Subsequently, she had a DVT in LLE about 10 years ago for which she received heparin and coumadin.  She stated that she stayed on coumadin for approx 6 months without complication.  After discussion of risks, benefits and alternatives, the patient decided to start warfarin with lovenox bridge.    DTat

## 2013-06-19 NOTE — Progress Notes (Signed)
*  Preliminary Results* Left lower extremity venous duplex completed. Left lower extremity is positive for deep vein thrombosis of varying stages throughout the left lower extremity. The left saphenofemoral junction and popliteal vein exhibit chronic thrombosis, the left femoral vein demonstrates subacute thrombosis, and the left posterior tibial and peroneal veins exhibit acute vs. indeterminate age thrombosis. There is no evidence of left Baker's cyst.  Preliminary results discussed with Dr.Tat.  06/19/2013 3:20 PM  Gertie Fey, RVT, RDCS, RDMS

## 2013-06-20 ENCOUNTER — Telehealth: Payer: Self-pay | Admitting: *Deleted

## 2013-06-20 DIAGNOSIS — G40909 Epilepsy, unspecified, not intractable, without status epilepticus: Principal | ICD-10-CM

## 2013-06-20 DIAGNOSIS — D696 Thrombocytopenia, unspecified: Secondary | ICD-10-CM

## 2013-06-20 LAB — CBC
HCT: 37.9 % (ref 36.0–46.0)
MCH: 35.7 pg — ABNORMAL HIGH (ref 26.0–34.0)
MCHC: 34.6 g/dL (ref 30.0–36.0)
MCV: 103.3 fL — ABNORMAL HIGH (ref 78.0–100.0)
Platelets: 123 10*3/uL — ABNORMAL LOW (ref 150–400)
RBC: 3.67 MIL/uL — ABNORMAL LOW (ref 3.87–5.11)
RDW: 12.6 % (ref 11.5–15.5)
WBC: 3.7 10*3/uL — ABNORMAL LOW (ref 4.0–10.5)

## 2013-06-20 LAB — PROTIME-INR: INR: 1 (ref 0.00–1.49)

## 2013-06-20 MED ORDER — WARFARIN SODIUM 5 MG PO TABS: 5.0000 mg | ORAL_TABLET | Freq: Every day | ORAL | Status: DC

## 2013-06-20 MED ORDER — CYANOCOBALAMIN 1000 MCG PO TABS: 1000.0000 ug | ORAL_TABLET | Freq: Every day | ORAL | Status: DC

## 2013-06-20 MED ORDER — DIVALPROEX SODIUM 125 MG PO CPSP: 500.0000 mg | ORAL_CAPSULE | Freq: Two times a day (BID) | ORAL | Status: DC

## 2013-06-20 MED ORDER — ENOXAPARIN SODIUM 60 MG/0.6ML ~~LOC~~ SOLN: 1.0000 mg/kg | Freq: Two times a day (BID) | SUBCUTANEOUS | Status: DC

## 2013-06-20 MED ORDER — ENOXAPARIN (LOVENOX) PATIENT EDUCATION KIT
PACK | Freq: Once | Status: AC
  Administered 2013-06-20: 11:00:00
  Filled 2013-06-20: qty 1

## 2013-06-20 MED ORDER — WARFARIN SODIUM 7.5 MG PO TABS
7.5000 mg | ORAL_TABLET | Freq: Once | ORAL | Status: AC
  Administered 2013-06-20: 15:00:00 7.5 mg via ORAL
  Filled 2013-06-20: qty 1

## 2013-06-20 MED ORDER — DIVALPROEX SODIUM 125 MG PO CPSP
500.0000 mg | ORAL_CAPSULE | Freq: Two times a day (BID) | ORAL | Status: DC
  Filled 2013-06-20: qty 4

## 2013-06-20 NOTE — Progress Notes (Signed)
Patient demonstrated Lovenox injections instructions. Patient has no questions or concerns related to discharge.  Patient verbalized understanding of discharge instructions. Patient is to pick up prescriptions at pharmacy. Patient was accompanied by nursing staff to lobby. Patient was stable at discharge.

## 2013-06-20 NOTE — Telephone Encounter (Signed)
Spoke with patient and she said that Dr Riley Lam at [facility] is requesting patient have her dilantin levels checked, hospitalized for seizure, blood clots, possibly going home today.

## 2013-06-20 NOTE — Discharge Summary (Signed)
Physician Discharge Summary  [name] UXL:[id number] DOB: 03/31/1956 DOA: 06/17/2013  PCP: Pearson Grippe, MD  Admit date: 06/17/2013 Discharge date: 06/20/2013  Recommendations for Outpatient Follow-up:  1. Pt will need to follow up with PCP in this week post discharge 2. Please obtain BMP to evaluate electrolytes and kidney function 3. Please check INR 06/21/13 or 06/22/13 and adjust coumadin accordingly 4. Please check depakote and dilantin levels next office visit 5. Follow up with neurology, Dr. Anne Hahn in 1-2 weeks  Discharge Diagnoses:  Active Problems:   DVT of leg (deep venous thrombosis) Seizure disorder with breakthrough seizures  -no further seizures last 48-72hrs  -appreciate neurology input  -defer adjustment of AEDs to neurology  -MRI brain 06/11/2013 showed extensive right parieto-occipital encephalomalacia representing prior hemorrhage and postsurgical changes. There were no acute intracranial findings.  -Continue Depakote, Dilantin, Vimpat  -Patient will need to have her Dilantin and Depakote levels monitored at her next office visit LLE DVT  -The patient complained of heaviness and discomfort in her left lower extremity. -Venous duplex positive for deep vein thrombosis of varying stages throughout the left lower extremity  -start coumadin with lovenox bridge -pt received coumadin 7.5mg  06/20/13 prior to d/c  -Rx for 5mg  tabs--f/u with PCP who will monitor INR and adjust coumadin -Risks, benefits, and alternatives including but not limited to life-threatening bleed and recurrent intracranial hemorrhage were discussed with the patient. She expressed understanding and increased followup treatment protocols.  -Follow up appointment was set up at her primary care physician office, Dr. 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  -B12--low normal at 328 on 06/12/2013  -Serum B12--239 06/10/2013.  -Supplement B12  -further workup per neurology  Macrocytic anemia  -Patient has low normal B12 which is being supplemented  -RBC folate599  Family Communication: spoke with mother  Disposition Plan: Home when medically stable   Discharge Condition: Stable  Disposition:  Follow-up Information   Follow up with Pearson Grippe, MD. (06/21/13@115PM )    Specialty:  Internal Medicine   Contact information:   [address] [place] [address] [phone]      discharge home  Diet: regular Wt Readings from Last 3 Encounters:  06/17/13 54.1 kg (119 lb 4.3 oz)  06/12/13 61 kg (134 lb 7.7 oz)  06/09/13 54.432 kg (120 lb)    History of present illness:  *57 y.o. 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 The patient had 2 witnessed seizures on the day prior to admission, but she refused to come to the hospital after the ambulance was called. On the morning of admission, the patient experienced another tonic-clonic seizure. EMS was activated, and she was brought to the emergency room. The patient had urinary incontinence with procedures. Patient states she has been on multiple seizure medications in the past including Depakote, Keppra, phenobarbital, Tegretol. Patient claims that she is very compliant with her medications. In fact, the patient was recently discharged from the hospital on 06/13/2013 during which  she was noted to be Dilantin toxic. She has good followup with Dr. Anne Hahn  of [facility] neurology, and claims that she also sees a neurologist in [place], and is being currently evaluated to see if she is a candidate for epilepsy surgery. She was discharged on 05/13/2013 with Vimpat 200mg  bid and phenytoin 100mg  am, 200mg  pm. Dr. Anne Hahn changed her phenytoin to 100mg  bid on 06/14/13.  Currently, the patient denies any headache, dizziness, visual disturbance, fevers, chills, chest discomfort, shortness breath, nausea, vomiting, diarrhea, abdominal pain, dysuria. In emergency department, the patient had another witnessed seizure. Ativan was given. The patient was given Dilantin 350 mg IV x1 in the emergency department. Vital signs are stable. Urinalysis was negative for pyuria. BMP and CBC were unremarkable except for her chronic thrombocytopenia. VPA level is 10. Dilantin level was 10.   After the patient was hospitalized, the patient was started on Depakote in addition to Dilantin and Vimpat by the neurology service.  The patient did not have any further seizures. However, the patient continued to complain of her "eyes fluttering" when she was watching television. Examination did not reveal any nystagmus or unusual eye movements. The patient was noted to have head titubation which was likely contributing to the patient having a sensation that her eyes were fluttering. I explained to the patient that these had movements are a separate issue from her seizure disorder, and that they will need to be addressed in the outpatient setting. The patient initially refused to take Depacon because she felt that it was making her "eye movements" worse. However, after extensive discussions the patient agreed to take the Depakote. During the hospitalization, the patient complained of a heavy sensation and discomfort in her left lower extremity. Venous duplex was obtained and showed DVTs in very degrees his acuity throughout the left lower extremity.I spoke with the pt and her mother and explained the  risks of anticoagulation vs IVC filter. 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 Subsequently, she had a DVT in LLE about 10 years ago for which she received heparin and coumadin. She stated that she stayed on coumadin for approx 6 months without complication. After discussion of risks, benefits and alternatives, the patient decided to start warfarin with lovenox bridge.    Consultants: neuroology  Discharge Exam: Filed Vitals:   06/20/13 0458  BP: 105/57  Pulse: 77  Temp: 98 F (36.7 C)  Resp: 18   Filed Vitals:   06/19/13 0457 06/19/13 1259 06/19/13 2048 06/20/13 0458  BP: 100/52 124/50 112/61 105/57  Pulse: 76 76 82 77  Temp: 97.6 F (36.4 C) 98.1 F (36.7 C) 97.5 F (36.4 C) 98 F (36.7 C)  TempSrc: Oral Oral Oral Oral  Resp: 18  18 18   Height:      Weight:      SpO2: 100% 99% 97% 99%   General: A&O x 3, NAD, pleasant, cooperative Cardiovascular: RRR, no rub, no gallop, no S3 Respiratory: Diminished breath sounds without any wheezing. Good air movement.  Abdomen:soft, nontender, nondistended, positive bowel sounds Extremities: No edema, No lymphangitis, no petechiae Neuro:  NO gaze preference or nystagmus  Discharge Instructions      Discharge Orders   Future Appointments Provider Department Dept Phone   06/21/2013 9:30 AM Ronal Fear, NP [facility] [phone]   12/09/2013 3:30 PM York Spaniel, MD [facility] [phone]   Future Orders Complete By Expires   Diet - low sodium heart healthy  As directed    Discharge instructions  As directed    Comments:     Please follow up at Dr. Elmyra Ricks office at [facility] tomorrow (06/21/13) at 1:15pm to get coumadin level checked. Continue to inject Lovenox 55mg  twice a day until your primary care physician tells you to stop Your primary care physician will adjust your coumadin after your levels are checked   Increase activity slowly  As directed         Medication List         albuterol 108 (90 BASE) MCG/ACT inhaler  Commonly known as:  PROVENTIL HFA;VENTOLIN HFA  Inhale 2 puffs into the lungs every 4 (four) hours as needed for wheezing or shortness of breath.     cyanocobalamin 1000 MCG tablet  Take 1 tablet (1,000 mcg total) by mouth daily.     divalproex 125 MG capsule  Commonly known as:  DEPAKOTE SPRINKLE  Take 4 capsules (500 mg total) by mouth every 12 (twelve) hours.     enoxaparin 60 MG/0.6ML injection  Commonly known as:  LOVENOX  Inject 0.55 mLs (55 mg total) into the skin every 12 (twelve) hours.     lacosamide 200 MG Tabs tablet  Commonly known as:  VIMPAT  Take 1 tablet (200 mg total) by mouth 2 (two) times daily.     phenytoin 100 MG ER capsule  Commonly known as:  DILANTIN  Take 100 mg by mouth 2 (two) times daily.     phenytoin 50 MG tablet  Commonly known as:  DILANTIN  Chew 1 tablet (50 mg total) by mouth daily.     warfarin 5 MG tablet  Commonly known as:  COUMADIN  Take 1 tablet (5 mg total) by mouth daily. Start taking 06/21/13         The results of significant diagnostics from this hospitalization (including imaging, microbiology, ancillary and laboratory) are listed below for reference.    Significant Diagnostic Studies: Ct Head Wo Contrast  06/11/2013   CLINICAL DATA:  Seizure history, history of brain surgery, complaining of upper body tremors  EXAM: CT HEAD WITHOUT CONTRAST  TECHNIQUE: Contiguous axial images were obtained from the base of the skull through the vertex without intravenous contrast.  COMPARISON:  04/27/2013  FINDINGS: Status post right craniotomy. Large area of encephalomalacia right parieto-occipital lobe, unchanged. No hemorrhage or acute infarct. No extra-axial fluid. Stable ex vacuo dilatation right ventricle occipital horn.  IMPRESSION: No change when compared to prior study. No acute findings.   Electronically Signed   By: Esperanza Heir M.D.   On: 06/11/2013 15:57    Mr [name] WU Contrast  06/11/2013   CLINICAL DATA:  History of recurrent generalized tonic-clonic seizures, with persistent seizures today. History of hemorrhage and subsequent surgery for AVM.  EXAM: MRI HEAD WITHOUT AND WITH CONTRAST  TECHNIQUE: Multiplanar, multiecho pulse sequences of the brain and surrounding structures were obtained without and with intravenous contrast.  COMPARISON:  CT head earlier today. Most recent MR brain 08/16/2010. Also prior CT scan 04/10/2004.  FINDINGS: No restricted diffusion to suggest acute stroke or visible seizure focus. Extensive right parieto-occipital encephalomalacia representing prior hemorrhage and postsurgical change, unchanged from priors.  Minimal cerebral and cerebellar atrophy. Mild subcortical and periventricular T2 and FLAIR hyperintensities, likely chronic microvascular ischemic change. No mass lesion, hydrocephalus, or extra-axial fluid.  Chronic hemorrhage at the surgical site. View flow voids are maintained in the carotid, basilar, and vertebral arteries.  Post infusion, no abnormal  enhancement of the brain or meninges. Mild dural enhancement at the surgical site is postoperative in nature. No visible residual enhancing vascular nidus. No evidence for venous sinus thrombosis.  Normal pituitary, cerebellar tonsils, and upper cervical region. No osseous lesions. Negative orbits. No acute sinus disease. Mild left mastoid fluid similar to priors.  IMPRESSION: Extensive right parieto-occipital encephalomalacia representing prior hemorrhage and postsurgical change. No acute intracranial findings. Similar appearance to prior CT and MR.   Electronically Signed   By: Davonna Belling M.D.   On: 06/11/2013 21:10   Mr [name]  06/05/2013   CLINICAL DATA:  Bilateral foot numbness for 3 days. History of seizures.  EXAM: MRI LUMBAR SPINE WITHOUT CONTRAST  TECHNIQUE: Multiplanar, multisequence MR imaging was performed. No intravenous contrast was  administered.  COMPARISON:  Radiographs 07/23/2011. CT 05/20/2010.  FINDINGS: Prior studies demonstrate 5 lumbar-type vertebral bodies. The alignment is normal. There is no evidence of fracture or pars defect. There are scattered endplate degenerative changes and Schmorl's nodes, similar to prior CT.  The conus medullaris extends to the L1-2 level and appears normal. No paraspinal abnormalities are identified. Dilatation of the thoracic duct in the right retrocrural area is unchanged from prior CT.  There is mild disc bulging with anterior osteophyte formation at T11-12.  There are no significant disc space findings at T12-L1 or L1-2.  L2-3: Mild disc bulging and facet hypertrophy. No spinal stenosis or nerve root encroachment.  L3-4: Mild disc bulging with mild facet and ligamentous hypertrophy. There is minimal narrowing of the left lateral recess without definite nerve root encroachment. The foramina are patent.  L4-5: Mild disc bulging with mild facet and ligamentous hypertrophy. No significant spinal stenosis or nerve root encroachment.  L5-S1: Disc height and hydration are maintained. There is mild bilateral facet hypertrophy. No spinal stenosis or nerve root encroachment.  IMPRESSION: 1. No acute findings or explanation for the patient's symptoms. 2. Mild thoracolumbar degenerative changes with disc bulging and facet disease. No significant spinal stenosis or nerve root encroachment.   Electronically Signed   By: Roxy Horseman M.D.   On: 06/05/2013 13:16     Microbiology: Recent Results (from the past 240 hour(s))  MRSA PCR SCREENING     Status: None   Collection Time    06/11/13 10:00 PM      Result Value Range Status   MRSA by PCR NEGATIVE  NEGATIVE Final   Comment:            The GeneXpert MRSA Assay (FDA     approved for NASAL specimens     only), is one component of a     comprehensive MRSA colonization     surveillance program. It is not     intended to diagnose MRSA     infection nor  to guide or     monitor treatment for     MRSA infections.     Labs: Basic Metabolic Panel:  Recent Labs Lab 06/17/13 0833 06/17/13 1538 06/18/13 0510 06/19/13 0513  NA 138 138 140 142  K 4.2 3.7 3.8 3.5  CL 102 103 106 105  CO2 27 27 29 28   GLUCOSE 98 118* 104* 95  BUN 10 8 9 11   CREATININE 0.73 0.65 0.70 0.62  CALCIUM 9.7 8.8 9.0 9.1   Liver Function Tests:  Recent Labs Lab 06/17/13 1538  AST 20  ALT 15  ALKPHOS 63  BILITOT 0.3  PROT 6.5  ALBUMIN 3.9   No  results found for this basename: LIPASE, AMYLASE,  in the last 168 hours No results found for this basename: AMMONIA,  in the last 168 hours CBC:  Recent Labs Lab 06/17/13 0833 06/18/13 0510 06/20/13 0429  WBC 4.5 5.5 3.7*  NEUTROABS 3.2  --   --   HGB 13.2 12.7 13.1  HCT 39.1 38.1 37.9  MCV 103.4* 103.5* 103.3*  PLT 131* 138* 123*   Cardiac Enzymes: No results found for this basename: CKTOTAL, CKMB, CKMBINDEX, TROPONINI,  in the last 168 hours BNP: No components found with this basename: POCBNP,  CBG: No results found for this basename: GLUCAP,  in the last 168 hours  Time coordinating discharge:  Greater than 30 minutes  Signed:  Irving Lubbers, DO [facility] Pager: [phone] 06/20/2013, 12:03 PM

## 2013-06-20 NOTE — Progress Notes (Addendum)
ANTICOAGULATION CONSULT NOTE - Follow Up Consult  Pharmacy Consult for Warfarin Indication: DVT  Allergies  Allergen Reactions  . Codeine Nausea And Vomiting  . Keppra [Levetiracetam] Other (See Comments)    Causes seizures.   [name] Rash    Patient Measurements: Height: 5\' 7"  (170.2 cm) Weight: 119 lb 4.3 oz (54.1 kg) IBW/kg (Calculated) : 61.6  Vital Signs: Temp: 98 F (36.7 C) (11/17 0458) Temp src: Oral (11/17 0458) BP: 105/57 mmHg (11/17 0458) Pulse Rate: 77 (11/17 0458)  Labs:  Recent Labs  06/17/13 1538 06/18/13 0510 06/19/13 0513 06/19/13 1750 06/20/13 0429  HGB  --  12.7  --   --  13.1  HCT  --  38.1  --   --  37.9  PLT  --  138*  --   --  123*  LABPROT  --   --   --  13.2 13.0  INR  --   --   --  1.02 1.00  CREATININE 0.65 0.70 0.62  --   --     Estimated Creatinine Clearance: 67.9 ml/min (by C-G formula based on Cr of 0.62).   Medications:  Scheduled:  . enoxaparin (LOVENOX) injection  1 mg/kg Subcutaneous Q12H  . lacosamide  200 mg Oral BID  . phenytoin  100 mg Oral BID  . phenytoin  50 mg Oral BID  . sodium chloride  3 mL Intravenous Q12H  . valproate sodium  500 mg Intravenous Q12H  . vitamin B-12  1,000 mcg Oral Daily  . Warfarin - Pharmacist Dosing Inpatient   Does not apply q1800    Assessment: 57yo F admitted with seizures. Dopplers revealed a LLE DVT. Lovenox was started and pharmacy was asked to dose Coumadin.  Day 2 overlap - Per CHEST guidelines, will need a 5 day overlap of Lovenox and Coumadin and until INR is therapeutic x 24hrs.  INR unchanged as expected after first dose of warfarin  H/H stable, Plt low prior to starting anticoag - monitor closely for bleeding  Drug interactions with anti-seizure meds noted, particularly phenytoin. Will monitor closely for increased effect on INR. Despite this interaction, warfarin is the best oral anticoag option.   **Would NOT use xarelto in this patient due to significant  interaction with phenytoin which will decrease the efficacy of xarelto anticoag effects **  Goal of Therapy:  INR 2-3 Monitor platelets by anticoagulation protocol: Yes   Plan:   Repeat warfarin 7.5mg  today  Continue lovenox 55mg  q12h per MD  Daily PT/INR  Provide warfarin education prior to discharge  Loralee Pacas, PharmD, BCPS Pager: [phone] 06/20/2013,8:56 AM

## 2013-06-20 NOTE — Telephone Encounter (Signed)
I called patient, left a message, will call back tomorrow. The patient was in hospital with a DVT, currently on Coumadin.

## 2013-06-20 NOTE — Progress Notes (Addendum)
NEURO HOSPITALIST PROGRESS NOTE   SUBJECTIVE:                                                                                                                        No further seizures over night.  Patietn states she feels "not herself" and that when she looks at TV it "jiggles".   OBJECTIVE:                                                                                                                           Vital signs in last 24 hours: Temp:  [97.5 F (36.4 C)-98.1 F (36.7 C)] 98 F (36.7 C) (11/17 0458) Pulse Rate:  [76-82] 77 (11/17 0458) Resp:  [18] 18 (11/17 0458) BP: (105-124)/(50-61) 105/57 mmHg (11/17 0458) SpO2:  [97 %-99 %] 99 % (11/17 0458)  Intake/Output from previous day: 11/16 0701 - 11/17 0700 In: 1493 [P.O.:1380; I.V.:3; IV Piggyback:110] Out: 3250 [Urine:3250] Intake/Output this shift:   Nutritional status: General  Past Medical History  Diagnosis Date  . Seizures   . DVT (deep venous thrombosis)   . Asthma   . Cerebral hemorrhage   . Chronic back pain   . DDD (degenerative disc disease) 06/12/2013      Neurologic Exam:  Mental Status: Alert, oriented, thought content appropriate.  Speech fluent without evidence of aphasia.  Able to follow 3 step commands without difficulty. Cranial Nerves: II: Visual fields grossly normal, pupils equal, round, reactive to light and accommodation III,IV, VI: ptosis not present, extra-ocular motions intact bilaterally V,VII: smile symmetric, facial light touch sensation normal bilaterally VIII: hearing normal bilaterally IX,X: gag reflex present XI: bilateral shoulder shrug XII: midline tongue extension without atrophy or fasciculations  Motor: Right : Upper extremity   5/5    Left:     Upper extremity   5/5  Lower extremity   5/5     Lower extremity   5/5 Tone and bulk:normal tone throughout; no atrophy noted Sensory: Pinprick and light touch intact throughout,  bilaterally Deep Tendon Reflexes:  Right: Upper Extremity   Left: Upper extremity   biceps (C-5 to C-6) 2/4   biceps (C-5 to C-6) 2/4 tricep (C7) 2/4    triceps (C7) 2/4 Brachioradialis (C6)  2/4  Brachioradialis (C6) 2/4  Lower Extremity Lower Extremity  quadriceps (L-2 to L-4) 2/4   quadriceps (L-2 to L-4) 2/4 Achilles (S1) 2/4   Achilles (S1) 2/4  Plantars: Right: downgoing   Left: downgoing    Lab Results: No results found for this basename: cbc, bmp, coags, chol, tri, ldl, hga1c   Lipid Panel No results found for this basename: CHOL, TRIG, HDL, CHOLHDL, VLDL, LDLCALC,  in the last 72 hours  Studies/Results: No results found.  MEDICATIONS                                                                                                                        Scheduled: . enoxaparin (LOVENOX) injection  1 mg/kg Subcutaneous Q12H  . enoxaparin   Does not apply Once  . lacosamide  200 mg Oral BID  . phenytoin  100 mg Oral BID  . phenytoin  50 mg Oral BID  . sodium chloride  3 mL Intravenous Q12H  . valproate sodium  500 mg Intravenous Q12H  . vitamin B-12  1,000 mcg Oral Daily  . warfarin  7.5 mg Oral ONCE-1800  . Warfarin - Pharmacist Dosing Inpatient   Does not apply q1800   Continuous:  UJW:[id number], acetaminophen, albuterol, LORazepam, ondansetron (ZOFRAN) IV, ondansetron, oxyCODONE-acetaminophen  ASSESSMENT/PLAN:                                                                                                            Recurrent focal seizures with secondary generalization, currently controlled over night with combination of Dilantin, Vimpat and the addition of valproic acid. States she feels slightly funny and that the TV is "jiggling" when she looks at it . No other complaints.  She states her appointment with [facility] for Spec scan is on 06/22/13.   Recommend: 1) Follow up with Dr. Anne Hahn with in 1-2 weeks  For follow up on AED regime.  2) Continue AED  Neuro  S/O  Assessment and plan discussed with with attending physician and they are in agreement.    [name] Morn PA-C [facility] [phone]  06/20/2013, 9:43 AM

## 2013-06-21 ENCOUNTER — Ambulatory Visit: Payer: Self-pay | Admitting: Nurse Practitioner

## 2013-06-21 NOTE — Telephone Encounter (Signed)
I called the patient. The patient was in the hospital with seizures and a DVT. Dilantin level was around 10 at the time she went in hospital. I have increased the Dilantin taking 250 mg daily. The patient will be seen in our office in about 2 weeks, we'll recheck Dilantin level at that time. If the patient has any further questions, she is to contact us again. I have called her twice.

## 2013-07-08 ENCOUNTER — Ambulatory Visit: Payer: Medicare Other | Admitting: Nurse Practitioner

## 2013-07-19 IMAGING — CR DG KNEE COMPLETE 4+V*L*
4 series · 4 of 4 positions shown · non-contrast
Comparison: None.

CLINICAL DATA: Fall, knee pain

LEFT KNEE - COMPLETE 4+ VIEW

[t knee ap left]
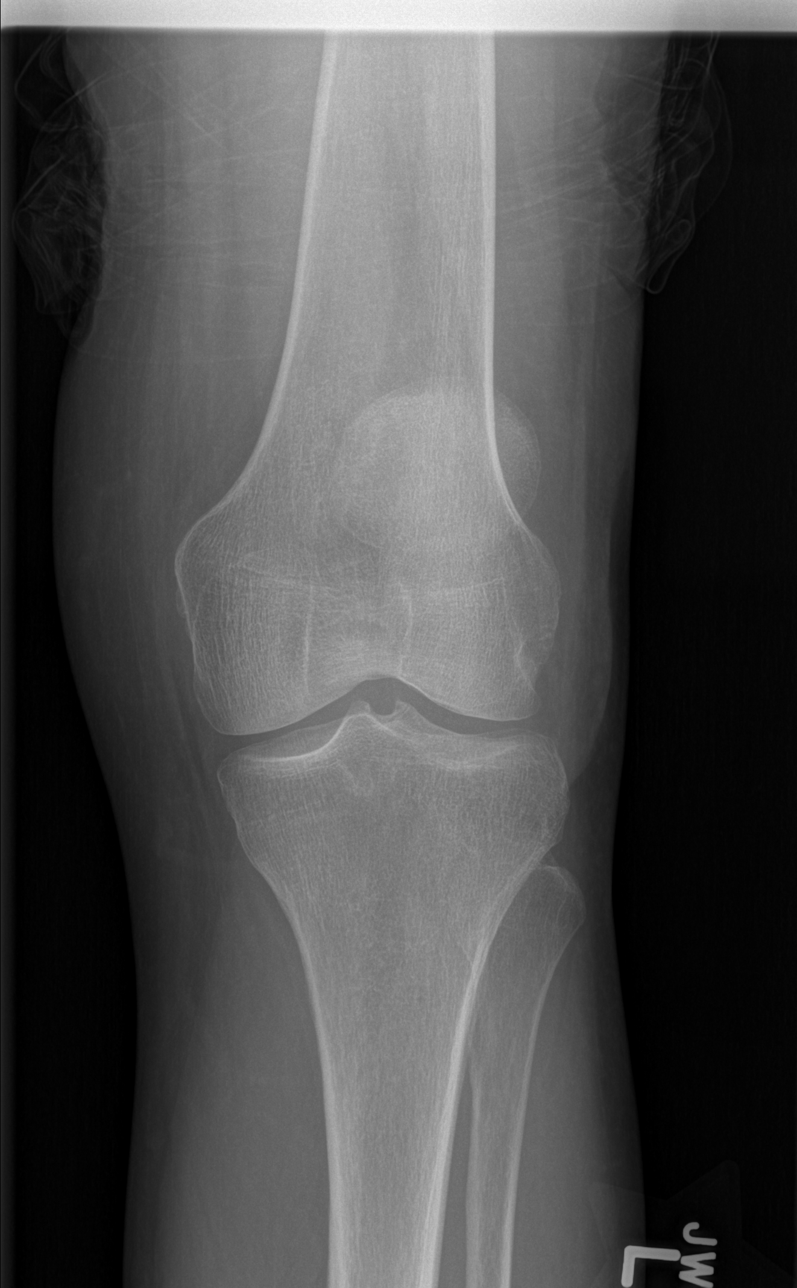

[t knee oblique left (1 of 2)]
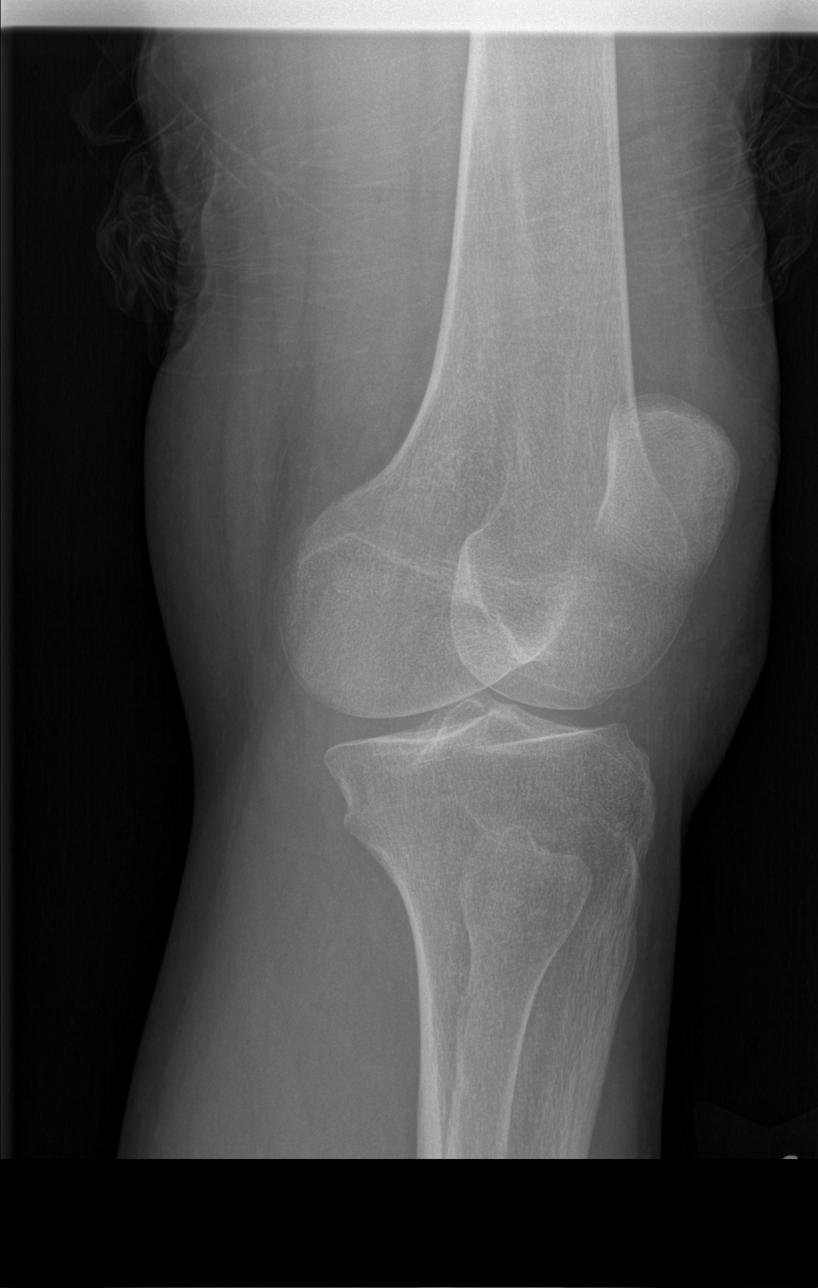

[t knee oblique left (2 of 2)]
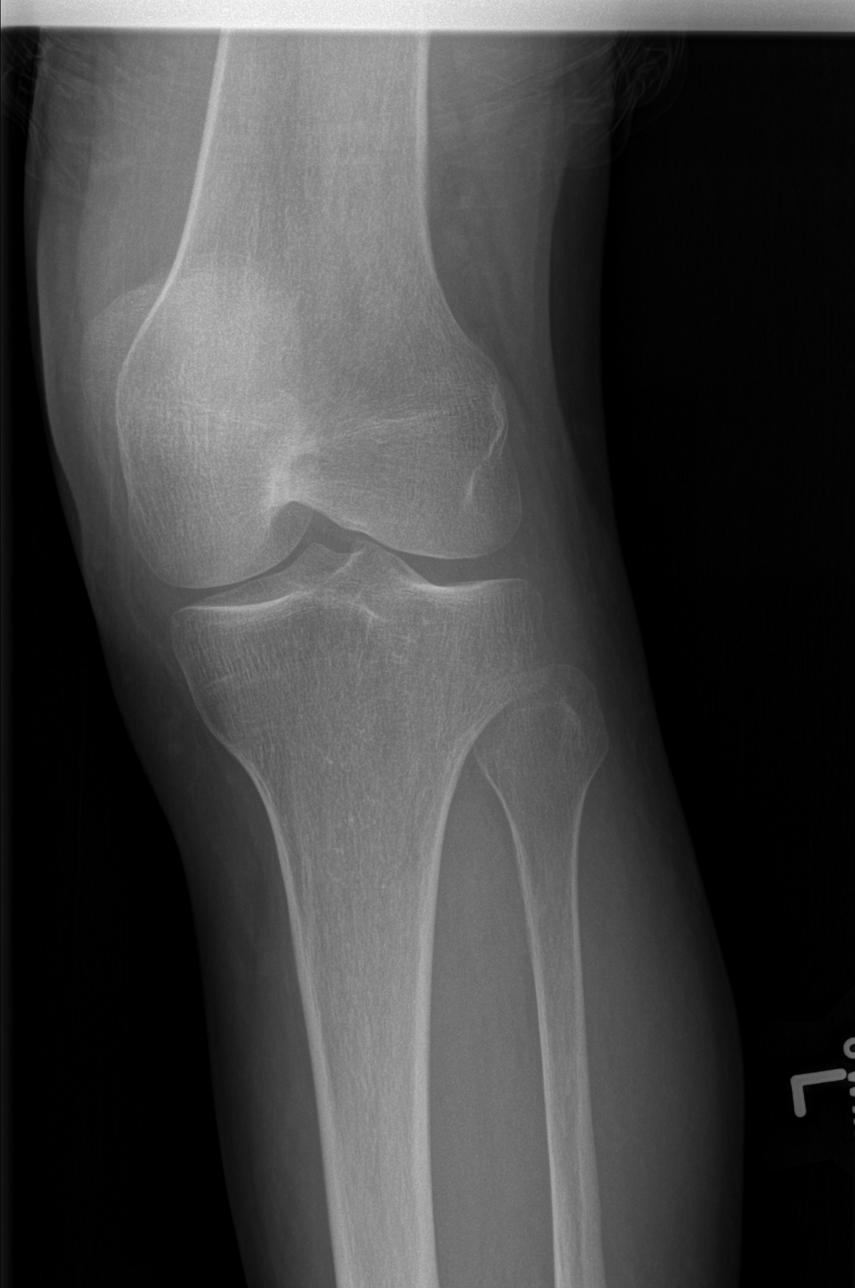

[t knee lat left]
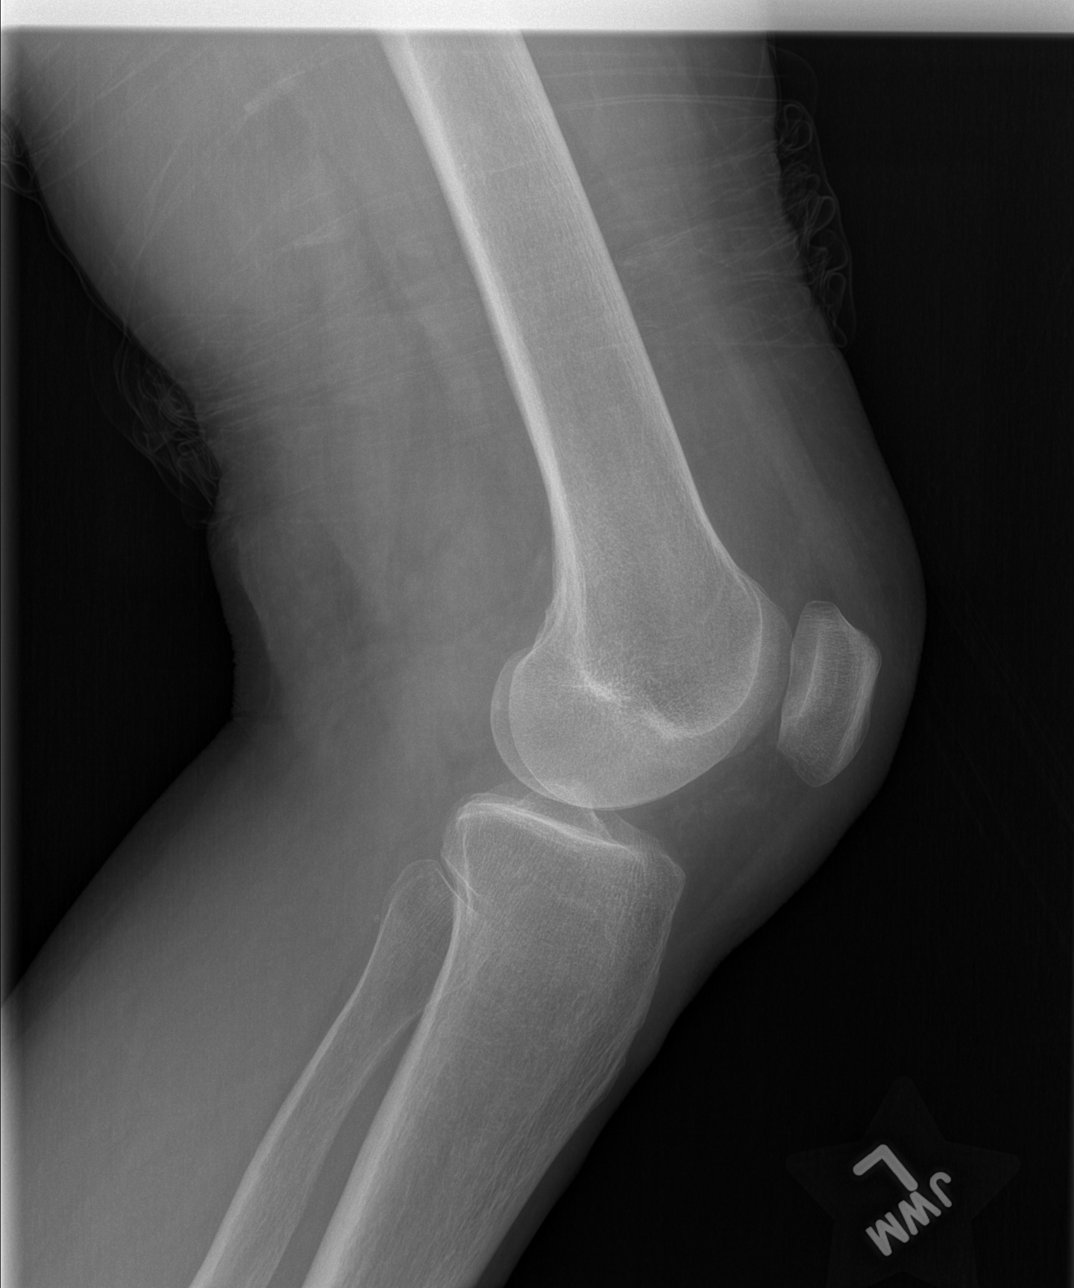

[4 of 4 positions shown; findings below may reference images not displayed]

FINDINGS: No fracture or dislocation.  No soft tissue abnormality.
No radiopaque foreign body.  Minimal tibial spine spurring noted.
IMPRESSION: No acute abnormality.

## 2013-07-19 IMAGING — CR DG FOOT COMPLETE 3+V*R*
3 series · 3 of 3 positions shown · non-contrast
Comparison: None.

CLINICAL DATA: Fall, right foot pain

RIGHT FOOT COMPLETE - 3+ VIEW

[t foot lat right]
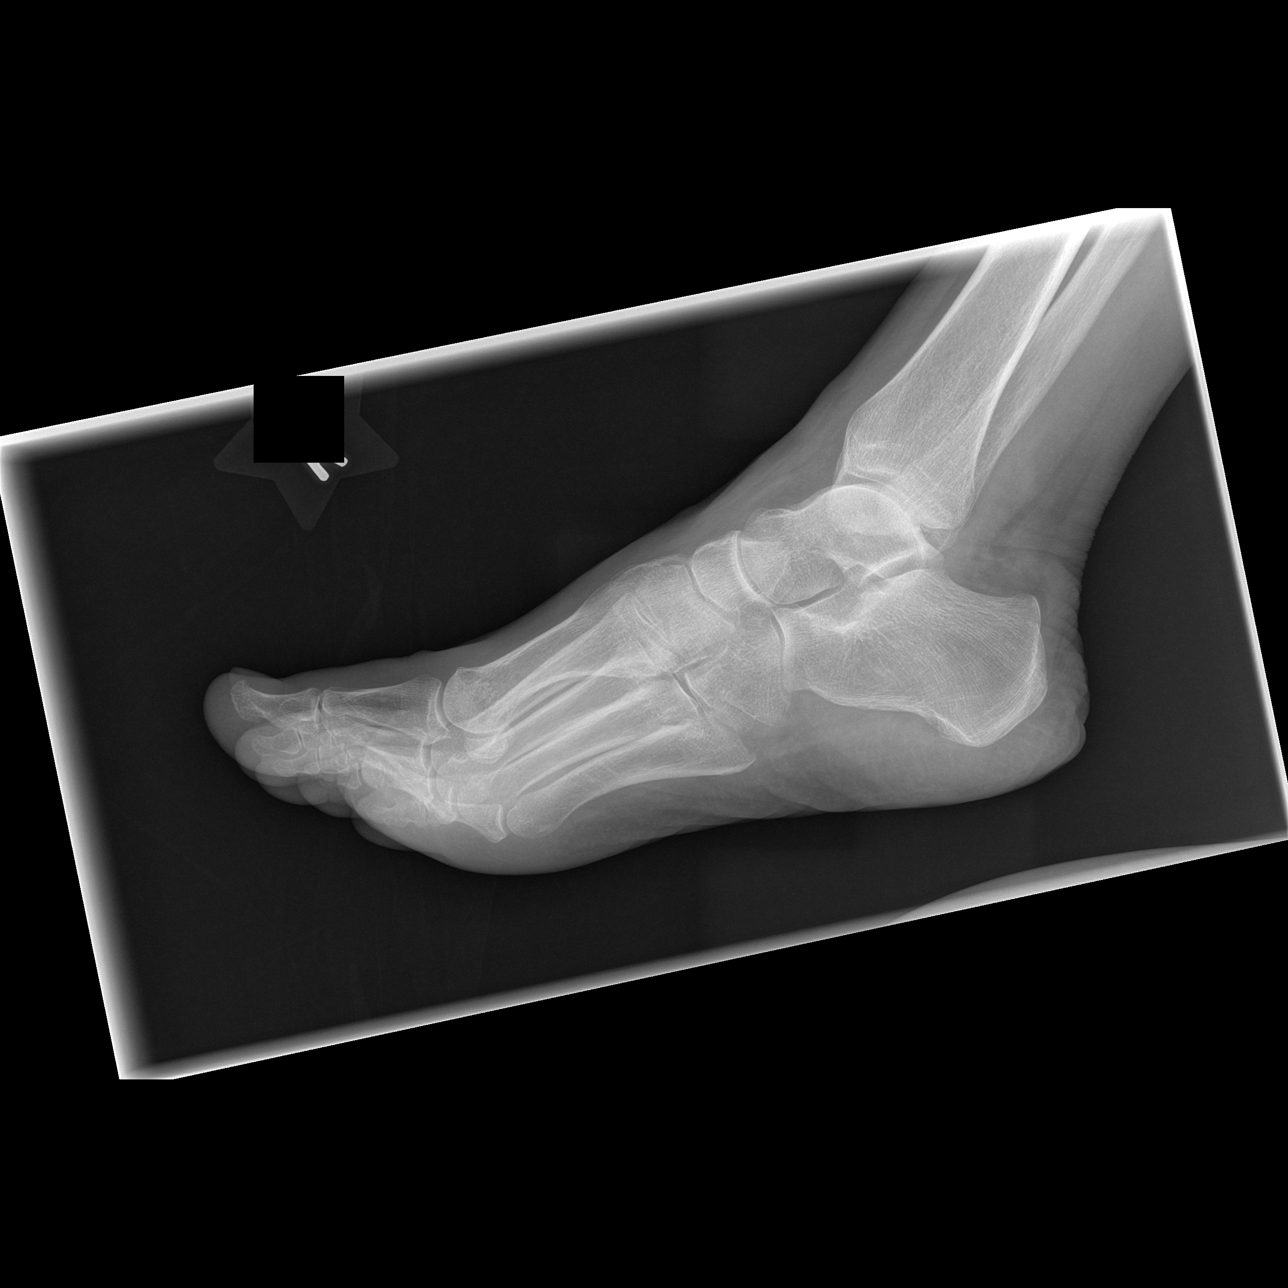

[t foot ap right]
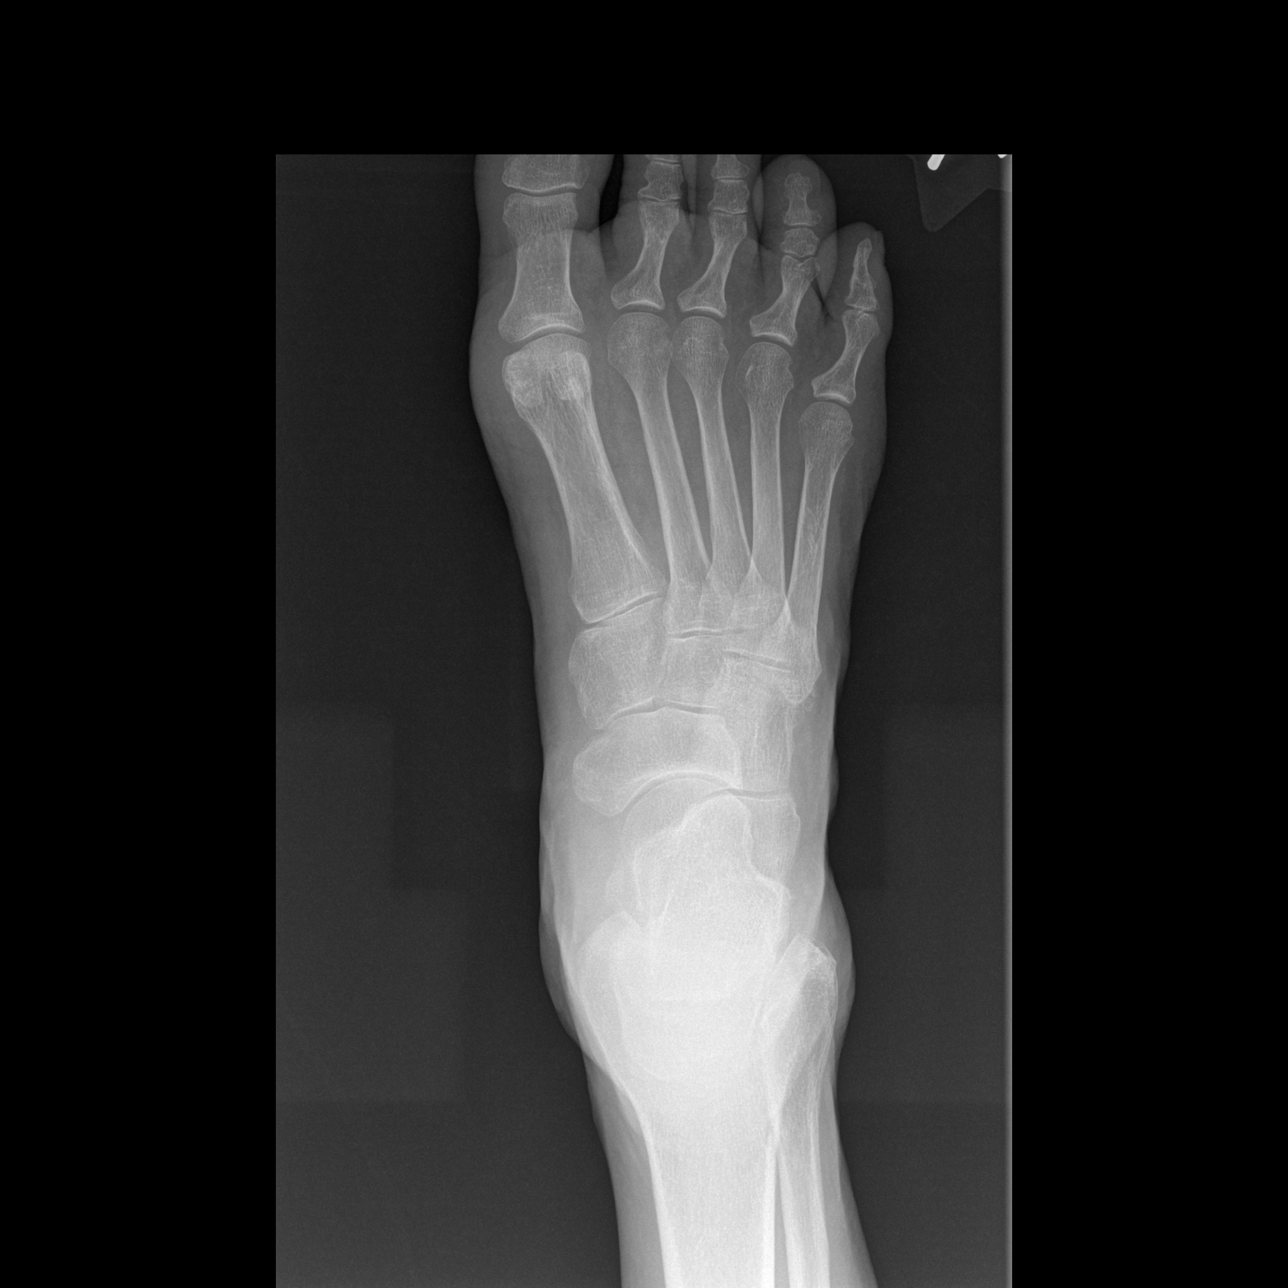

[t foot oblique right]
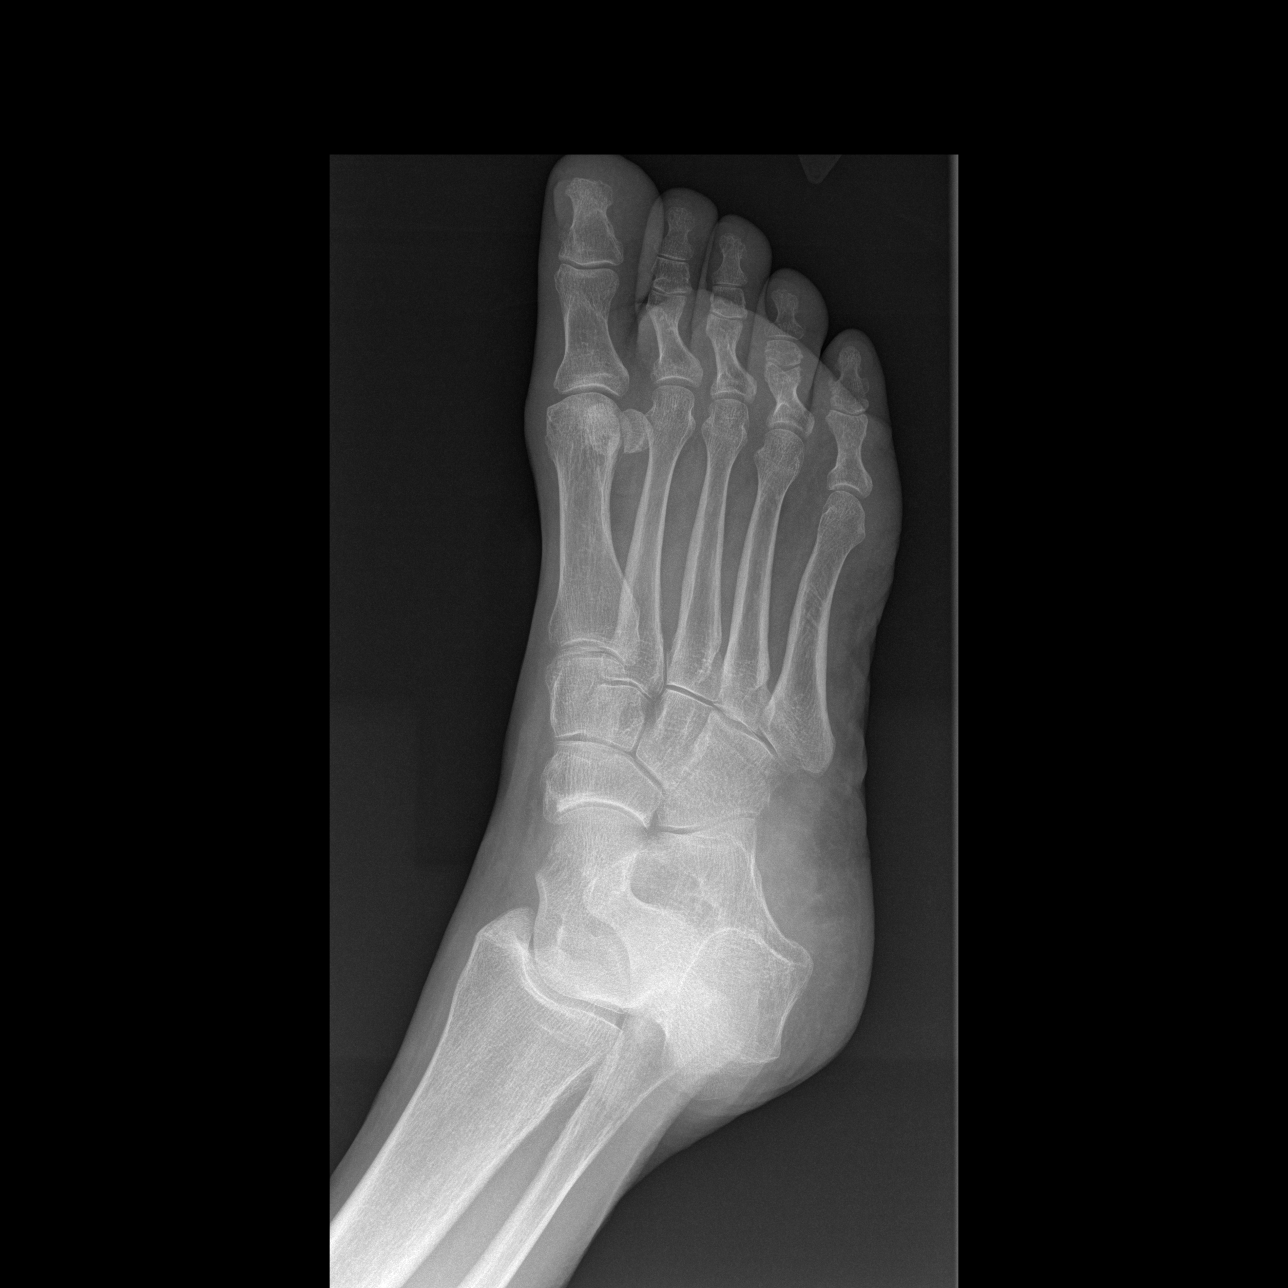

[3 of 3 positions shown; findings below may reference images not displayed]

FINDINGS: The bones are osteopenic out of proportion to that
expected for the patient's age.  This could mask subtle fracture.
No fracture or dislocation.  No soft tissue abnormality.  No
radiopaque foreign body.
IMPRESSION: No acute abnormality.

## 2013-08-02 ENCOUNTER — Other Ambulatory Visit: Payer: Self-pay | Admitting: Diagnostic Neuroimaging

## 2013-08-04 ENCOUNTER — Other Ambulatory Visit: Payer: Self-pay | Admitting: Diagnostic Neuroimaging

## 2013-09-01 IMAGING — CR DG LUMBAR SPINE COMPLETE 4+V
5 series · 5 of 5 positions shown · non-contrast
Comparison: 08/25/2010

CLINICAL DATA: Fall

LUMBAR SPINE - COMPLETE 4+ VIEW

[t l-spine a.p.]
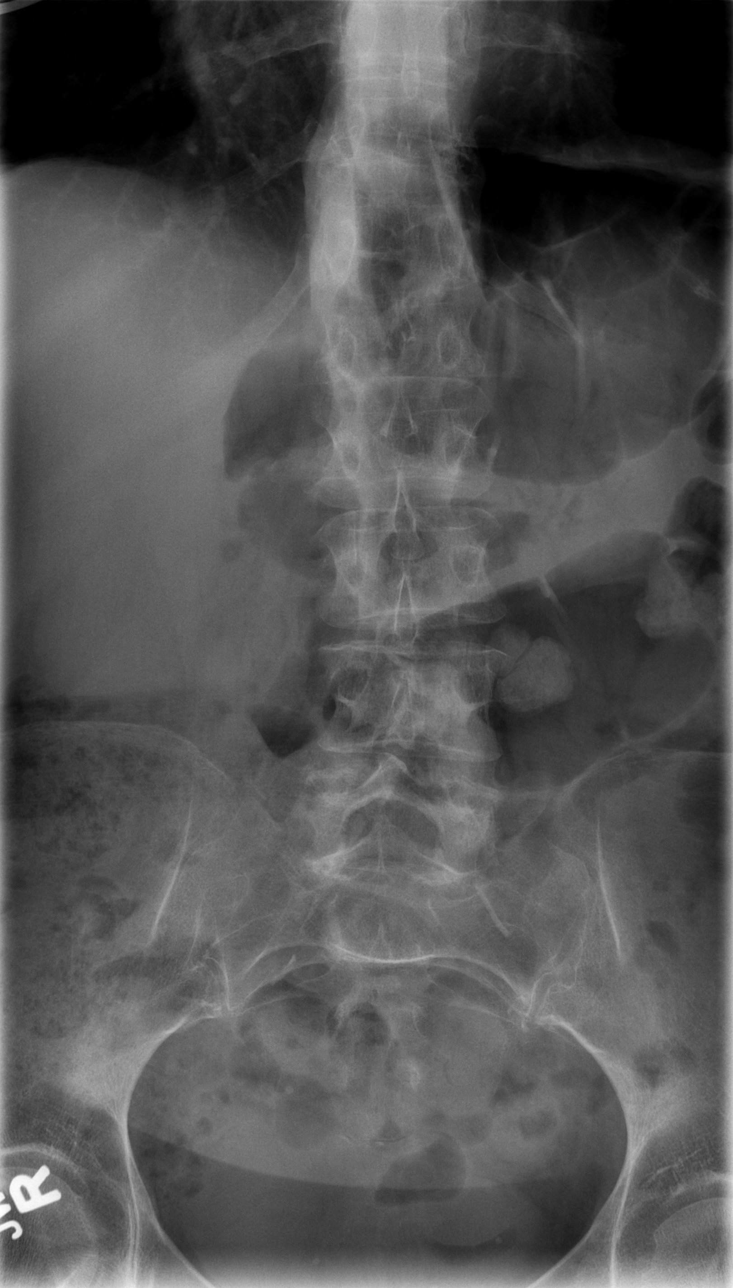

[t l-spine oblique exposure (1 of 2)]
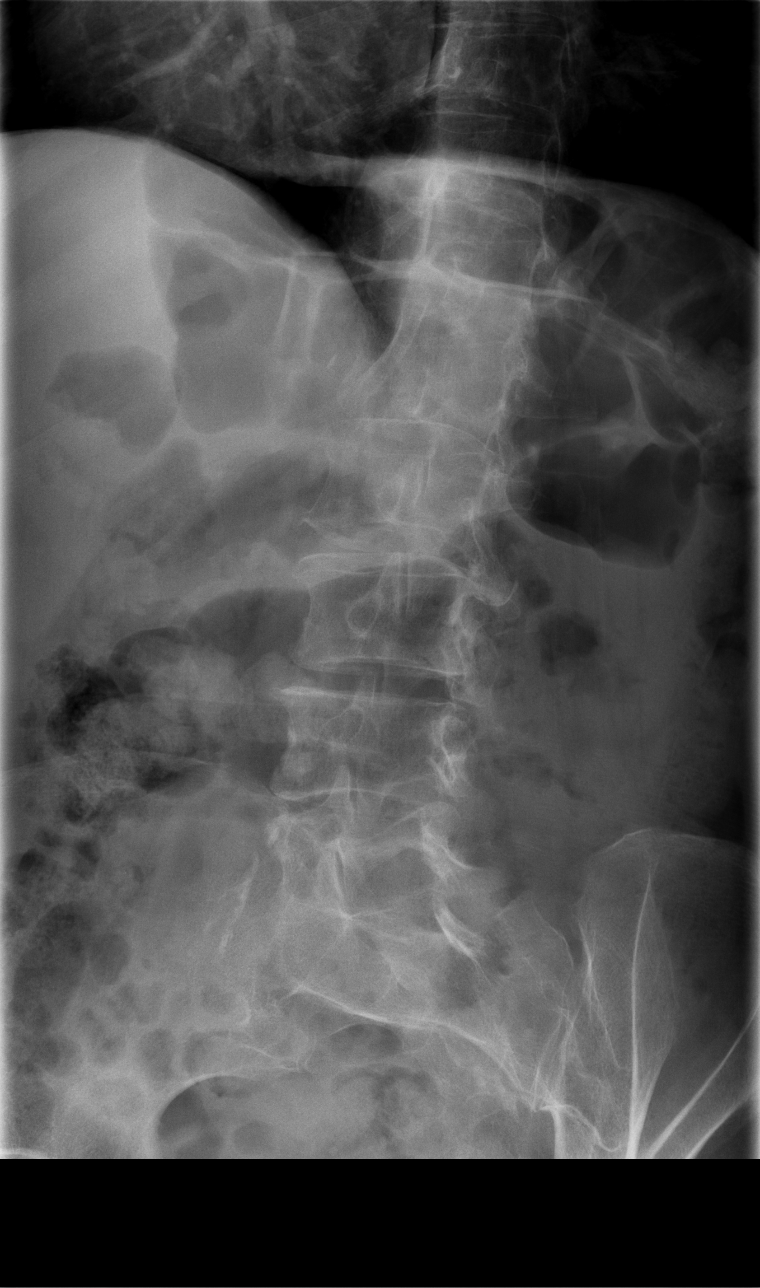

[t l-spine oblique exposure (2 of 2)]
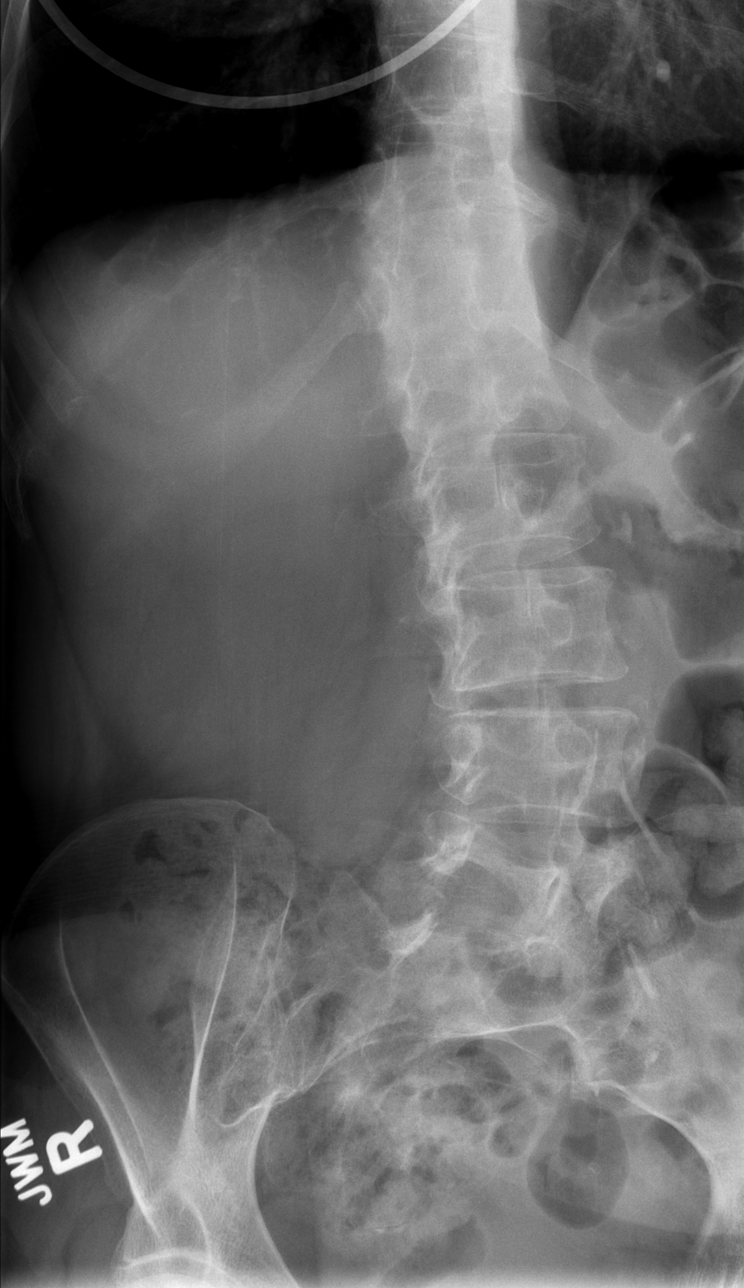

[t l-spine lat]
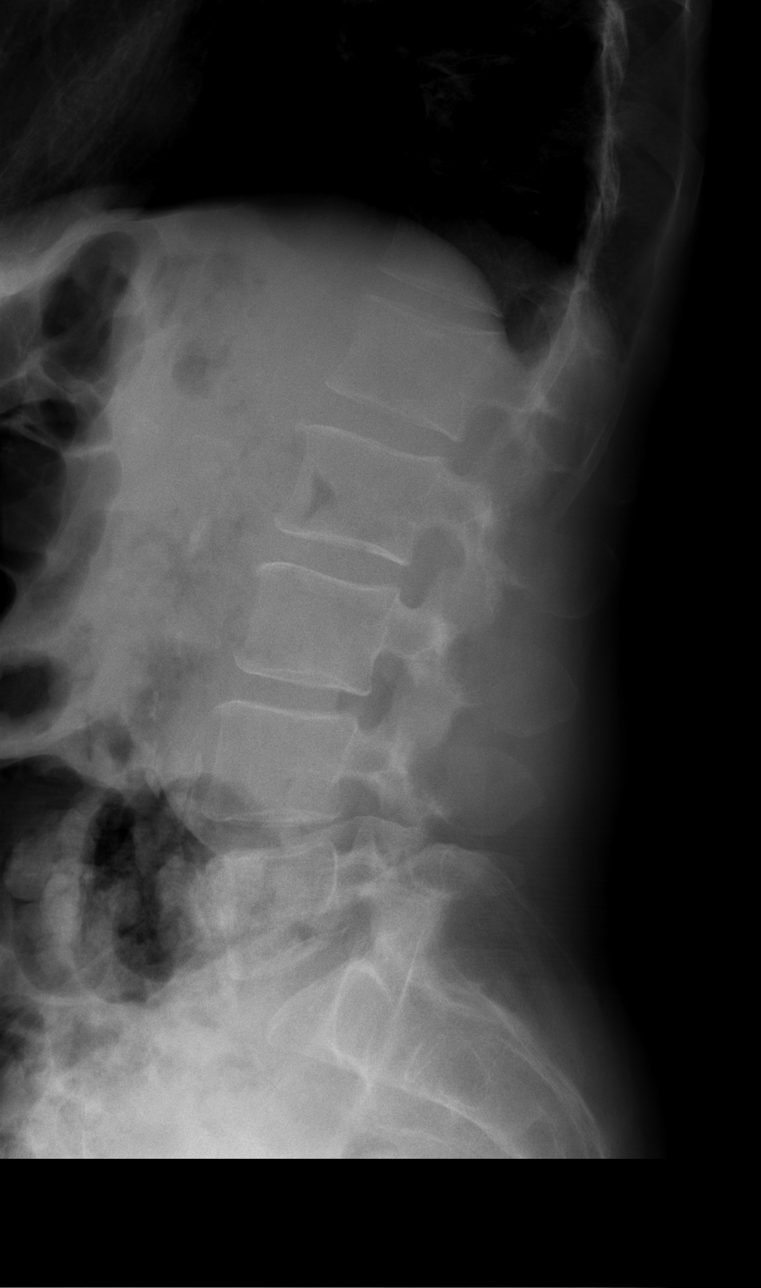

[t l-spine l5-s1 spot]
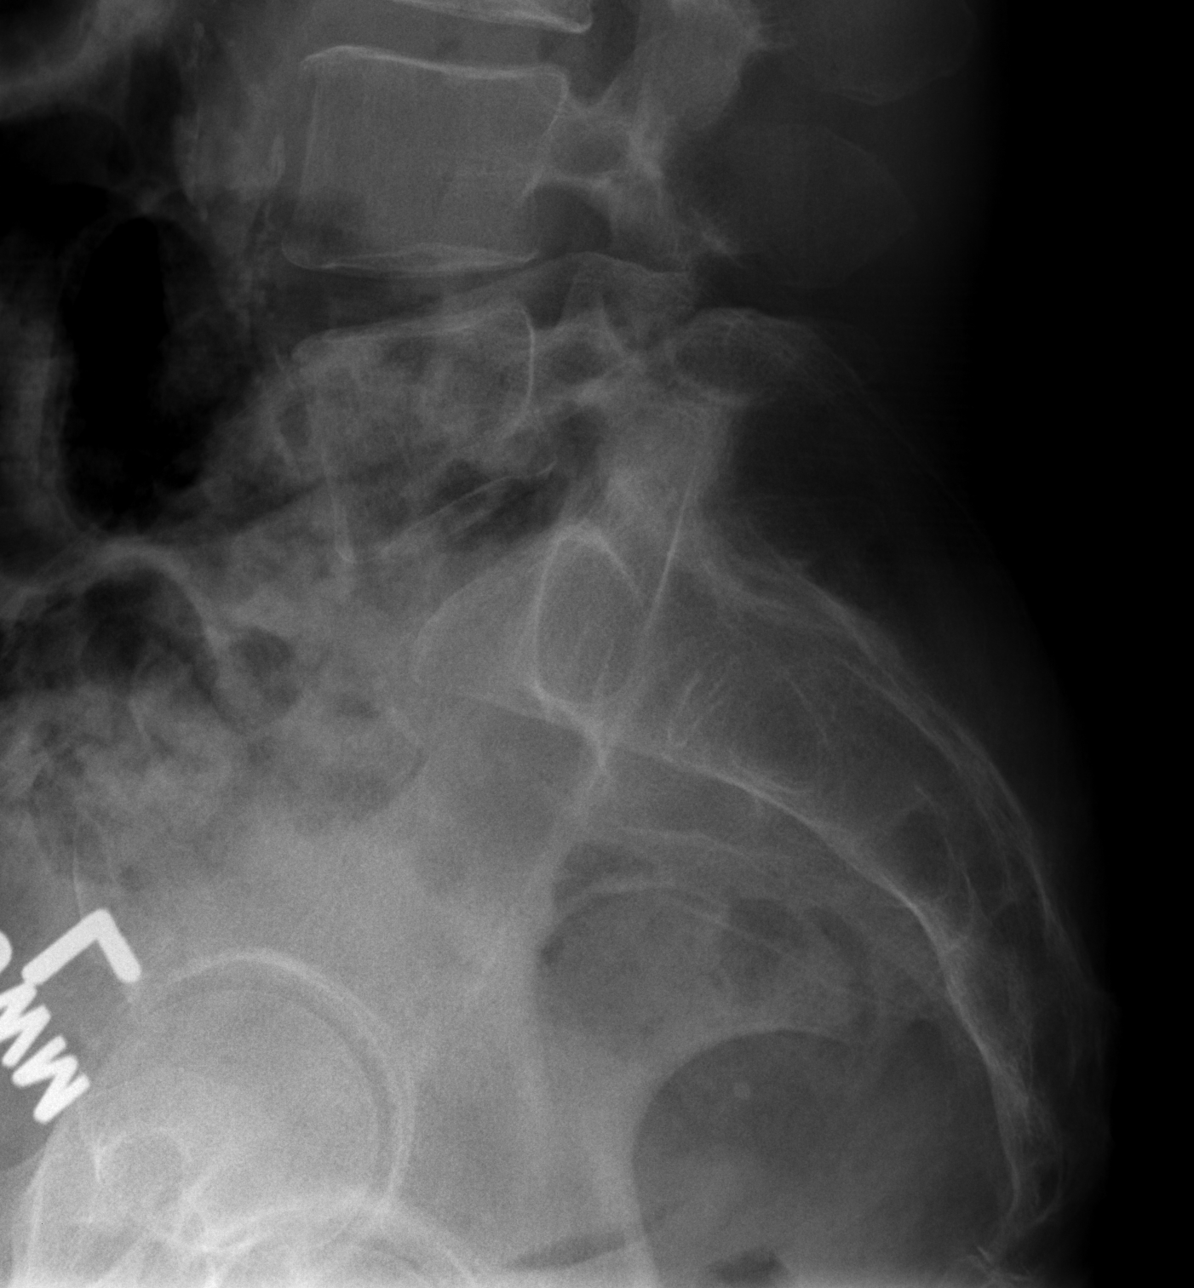

[5 of 5 positions shown; findings below may reference images not displayed]

FINDINGS: Negative for fracture.  Normal alignment.  No significant
disc degeneration or pars defect.
IMPRESSION: Negative

## 2013-09-05 ENCOUNTER — Telehealth: Payer: Self-pay | Admitting: Neurology

## 2013-09-05 DIAGNOSIS — Z5181 Encounter for therapeutic drug level monitoring: Secondary | ICD-10-CM

## 2013-09-05 MED ORDER — PHENYTOIN SODIUM EXTENDED 100 MG PO CAPS: 100.0000 mg | ORAL_CAPSULE | Freq: Two times a day (BID) | ORAL | Status: DC

## 2013-09-05 NOTE — Telephone Encounter (Signed)
I called patient. The patient had a level of 32 on Dilantin at 300 mg daily. The patient was to go down to 250 mg daily taking 100 mg twice daily, and 50 mg once daily. As it appears, the patient began taking 150 mg twice daily, which is the same dose that she was toxic on previously. The patient switched back to taking 100 mg of Dilantin in the morning, and 200 mg in the evening, once again at 300 mg daily dose. Toxic Dilantin levels may increase seizures. The patient will come in tomorrow for a Dilantin level.

## 2013-09-05 NOTE — Telephone Encounter (Signed)
Patient states she would like her Dilantin dose increased to 100mg  in the morning and 200mg  at night as she feels stable on this dose.  Please advise.  Thank you.

## 2013-09-05 NOTE — Telephone Encounter (Signed)
I called pt and she has for several months now, been taking Dilantin 100mg  po am and 200mg  po pm, Vimpat 100mg  po bid.  She had increased the dosing on her own, she said because of seizures.  She had been doing ok, until recently when she went for her refill she could not get?  and had to decrease dose and had a seizure last Thursday.   She is going to [facility] research for laser brain surgery possible candidate , due to have PET scan on 09-22-13.  I relayed that from previous notes, repeat labs were to be done.  [phone]

## 2013-09-05 NOTE — Telephone Encounter (Signed)
Pt called and stated that she needs a refill on the 100 mg Dilantin.  She stated that Dr. [name] decreased her dosage to 50 mg and she started having seizures.  She says that the one 100 mg Dilantin in the morning and two pills in the evening are keeping her stable. She also said that she has an appointment at [facility] on 09-22-13 for a PET scan.  She asked that this be called in to [facility] on [address].  Please call with any questions.

## 2013-09-05 NOTE — Telephone Encounter (Signed)
I called pt and asked her to call me back about her dosing and how she is.  Should be on 250mg  po daily.  (requests to take 300mg  daily).

## 2013-10-06 ENCOUNTER — Other Ambulatory Visit: Payer: Self-pay | Admitting: Diagnostic Neuroimaging

## 2013-10-06 NOTE — Telephone Encounter (Signed)
Pharmacy is requesting a refill on Dilantin.  By viewing note from 23/76, I was not certain what dose needs to be sent.  Please advise.  Thank you.

## 2013-10-26 ENCOUNTER — Telehealth: Payer: Self-pay | Admitting: Diagnostic Neuroimaging

## 2013-10-26 NOTE — Telephone Encounter (Signed)
Patient states she had  2 seizures a couple of weeks ago--does she need to come in to have Dilantin check--please call.

## 2013-10-26 NOTE — Telephone Encounter (Signed)
The patient reports increased seizures recently. Levels have been in the low therapeutic range when last checked 4 months ago. I'll get another revisit for her. We'll need to check blood work at that time.

## 2013-10-28 ENCOUNTER — Telehealth: Payer: Self-pay | Admitting: Neurology

## 2013-10-28 MED ORDER — PHENYTOIN SODIUM EXTENDED 100 MG PO CAPS: ORAL_CAPSULE | ORAL | Status: DC

## 2013-10-28 NOTE — Telephone Encounter (Signed)
Patient says she is back to taking Dilantin 100mg  in am and 200mg  in pm.  She would like a new Rx written to reflect this dose.  States she is taking Vimapt as prescribed.  She has an appt scheduled on 04/03.  Please advise.  Thank you.

## 2013-10-28 NOTE — Telephone Encounter (Signed)
Pt called.  She stated that she needs a refill/new prescription for her DILANTIN 100 MG ER capsule.  She states that she takes 1 in the AM and 2 at bedtime.  She is taking the Vimpat has prescribed.  Please call Pt if necessary.  Thank you.

## 2013-10-28 NOTE — Telephone Encounter (Signed)
I will call in a prescription for Dilantin. The patient was on 100 mg twice daily, 50 mg in the evening. This represents a 50 mg increase in dose. The patient is to stop the 50 mg tablet. The patient was running levels in the 10.0 range for Dilantin.

## 2013-11-04 ENCOUNTER — Ambulatory Visit: Payer: Self-pay | Admitting: Neurology

## 2013-11-04 ENCOUNTER — Telehealth: Payer: Self-pay | Admitting: Neurology

## 2013-11-04 NOTE — Telephone Encounter (Signed)
This patient did not show for a revisit appointment today. 

## 2013-11-17 DIAGNOSIS — Q282 Arteriovenous malformation of cerebral vessels: Secondary | ICD-10-CM | POA: Insufficient documentation

## 2013-11-17 DIAGNOSIS — G40219 Localization-related (focal) (partial) symptomatic epilepsy and epileptic syndromes with complex partial seizures, intractable, without status epilepticus: Secondary | ICD-10-CM | POA: Insufficient documentation

## 2013-11-17 DIAGNOSIS — I619 Nontraumatic intracerebral hemorrhage, unspecified: Secondary | ICD-10-CM | POA: Insufficient documentation

## 2013-11-17 DIAGNOSIS — Z8673 Personal history of transient ischemic attack (TIA), and cerebral infarction without residual deficits: Secondary | ICD-10-CM | POA: Insufficient documentation

## 2013-11-21 ENCOUNTER — Telehealth: Payer: Self-pay | Admitting: Neurology

## 2013-11-21 ENCOUNTER — Ambulatory Visit: Payer: Self-pay | Admitting: Neurology

## 2013-11-21 NOTE — Telephone Encounter (Signed)
This patient did not show for a revisit appointment today. 

## 2013-12-09 ENCOUNTER — Telehealth: Payer: Self-pay | Admitting: Neurology

## 2013-12-09 ENCOUNTER — Ambulatory Visit: Payer: Medicare Other | Admitting: Neurology

## 2013-12-09 NOTE — Telephone Encounter (Signed)
This is the third no-show for this patient. The patient will be discharged from our practice.

## 2013-12-12 ENCOUNTER — Encounter: Payer: Self-pay | Admitting: Neurology

## 2013-12-23 ENCOUNTER — Emergency Department (HOSPITAL_COMMUNITY)
Admission: EM | Admit: 2013-12-23 | Discharge: 2013-12-24 | Disposition: A | Discharge status: Home / Self Care | Payer: Medicare HMO | Attending: Emergency Medicine | Admitting: Emergency Medicine

## 2013-12-23 ENCOUNTER — Encounter (HOSPITAL_COMMUNITY): Payer: Self-pay | Admitting: Emergency Medicine

## 2013-12-23 DIAGNOSIS — Z79899 Other long term (current) drug therapy: Secondary | ICD-10-CM | POA: Insufficient documentation

## 2013-12-23 DIAGNOSIS — M79609 Pain in unspecified limb: Secondary | ICD-10-CM | POA: Insufficient documentation

## 2013-12-23 DIAGNOSIS — Z8679 Personal history of other diseases of the circulatory system: Secondary | ICD-10-CM | POA: Insufficient documentation

## 2013-12-23 DIAGNOSIS — G40909 Epilepsy, unspecified, not intractable, without status epilepticus: Secondary | ICD-10-CM | POA: Insufficient documentation

## 2013-12-23 DIAGNOSIS — Z87828 Personal history of other (healed) physical injury and trauma: Secondary | ICD-10-CM | POA: Insufficient documentation

## 2013-12-23 DIAGNOSIS — Z86718 Personal history of other venous thrombosis and embolism: Secondary | ICD-10-CM | POA: Insufficient documentation

## 2013-12-23 DIAGNOSIS — IMO0002 Reserved for concepts with insufficient information to code with codable children: Secondary | ICD-10-CM | POA: Insufficient documentation

## 2013-12-23 DIAGNOSIS — Z7982 Long term (current) use of aspirin: Secondary | ICD-10-CM | POA: Insufficient documentation

## 2013-12-23 DIAGNOSIS — F17200 Nicotine dependence, unspecified, uncomplicated: Secondary | ICD-10-CM | POA: Insufficient documentation

## 2013-12-23 DIAGNOSIS — R569 Unspecified convulsions: Secondary | ICD-10-CM

## 2013-12-23 DIAGNOSIS — G8929 Other chronic pain: Secondary | ICD-10-CM | POA: Insufficient documentation

## 2013-12-23 DIAGNOSIS — M7989 Other specified soft tissue disorders: Secondary | ICD-10-CM | POA: Insufficient documentation

## 2013-12-23 DIAGNOSIS — J45909 Unspecified asthma, uncomplicated: Secondary | ICD-10-CM | POA: Insufficient documentation

## 2013-12-23 LAB — CBG MONITORING, ED: Glucose-Capillary: 99 mg/dL (ref 70–99)

## 2013-12-23 NOTE — ED Provider Notes (Signed)
CSN: [id number]     Arrival date & time 12/23/13  2131 History   First MD Initiated Contact with Patient 12/23/13 2253     Chief Complaint  Patient presents with  . Leg Pain  . Seizures     (Consider location/radiation/quality/duration/timing/severity/associated sxs/prior Treatment) HPI Comments: 58-year-old female with a history of DVT in the last 6 months, she also has a history of a cerebral hemorrhage, seizures and chronic pain.  The patient was witnessed to have seizures in November of this year during which time she was admitted to the hospital and the neurology service start the patient on Dilantin and Vimpat.   she had no further seizures, but during that hospitalization was found to have a DVT in the left lower extremity. She also reported having venous thromboembolic events approximately 10 years prior but in the interim the patient had had a hemorrhagic brain injury. It was discussed with the patient at that time and she agreed to go on Lovenox to Coumadin has been several months on Coumadin but no longer takes that medication. She reports that approximately 3 days ago she started to have increased swelling and pain in her legs left greater than right and noticed that the swelling was increased on the left side. She denies chest pain or shortness of breath and has no nausea, vomiting, fever, chills, rash, redness of the skin, blurred vision, sore throat, abdominal pain, diarrhea or dysuria. The symptoms are persistent, gradually getting worse, nothing seems to make it better or worse. 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 The family member states that she does occasionally have seizures when she gets stressed out and reports that a family member is currently in the hospital and required surgery after a traumatic event.  Patient is a 58 y.o. female presenting with leg pain and seizures.  The history is provided by the patient and a relative.  Leg Pain Seizures   Past Medical History  Diagnosis Date  . Seizures   . DVT (deep venous thrombosis)   . Asthma   . Cerebral hemorrhage   . Chronic back pain   . DDD (degenerative disc disease) 06/12/2013   Past Surgical History  Procedure Laterality Date  . Cesarean section    . Brain surgery      craniotomy with hematoma evacuation   Family History  Problem Relation Age of Onset  . Heart attack Mother   . Heart attack Father    History  Substance Use Topics  . Smoking status: Current Every Day Smoker -- 0.50 packs/day for 20 years    Types: Cigarettes  . Smokeless tobacco: Never Used  . Alcohol Use: No   OB History   Grav Para Term Preterm Abortions TAB SAB Ect Mult Living                 Review of Systems  Neurological: Positive for seizures.  All other systems reviewed and are negative.     Allergies  Codeine; Keppra; and Olive oil  Home Medications   Prior to Admission medications   Medication Sig Start Date End Date Taking? Authorizing Provider  aspirin 81 MG tablet Take 81 mg by mouth daily.   Yes Historical Provider, MD  ibuprofen (ADVIL,MOTRIN) 200 MG tablet Take 200 mg by mouth every 6 (six) hours as needed.   Yes Historical Provider, MD  lacosamide (VIMPAT) 200 MG  TABS tablet Take 200 mg by mouth 2 (two) times daily.   Yes Historical Provider, MD  phenytoin (DILANTIN) 100 MG ER capsule 1 capsule in the morning, 2 capsules in the evening 10/28/13  Yes Kathrynn Ducking, MD  VIMPAT 200 MG TABS tablet TAKE 1 TABLET BY MOUTH TWICE A DAY 08/02/13  Yes Penni Bombard, MD  vitamin B-12 1000 MCG tablet Take 1 tablet (1,000 mcg total) by mouth daily. 06/20/13  Yes David Tat, MD   BP 101/57  Pulse 72  Temp(Src) 98.7 F (37.1 C) (Oral)  Resp 19  SpO2 94% Physical Exam  Nursing note and vitals reviewed. Constitutional: She appears well-developed and well-nourished. No distress.  HENT:  Head:  Normocephalic and atraumatic.  Mouth/Throat: Oropharynx is clear and moist. No oropharyngeal exudate.  Eyes: Conjunctivae and EOM are normal. Pupils are equal, round, and reactive to light. Right eye exhibits no discharge. Left eye exhibits no discharge. No scleral icterus.  Neck: Normal range of motion. Neck supple. No JVD present. No thyromegaly present.  Cardiovascular: Normal rate, regular rhythm, normal heart sounds and intact distal pulses.  Exam reveals no gallop and no friction rub.   No murmur heard. Pulmonary/Chest: Effort normal and breath sounds normal. No respiratory distress. She has no wheezes. She has no rales.  Abdominal: Soft. Bowel sounds are normal. She exhibits no distension and no mass. There is no tenderness.  Musculoskeletal: Normal range of motion. She exhibits edema ( Scant left lower extremity asymmetry left greater than right, mild edema. ). She exhibits no tenderness.  Warm perfused lower extremities with bilateral normal pulses at dorsalis pedis and normal capillary refill.  Lymphadenopathy:    She has no cervical adenopathy.  Neurological: She is alert. Coordination normal.  Moves all extremities x4, speech is clear, movements are calculated and coordinated, cranial nerves III through XII intact., Memory intact  Skin: Skin is warm and dry. No rash noted. No erythema.  Psychiatric: She has a normal mood and affect. Her behavior is normal.    ED Course  Procedures (including critical care time) Labs Review Labs Reviewed  PHENYTOIN LEVEL, TOTAL - Abnormal; Notable for the following:    Phenytoin Lvl 9.2 (*)    All other components within normal limits  CBG MONITORING, ED    Imaging Review No results found.   EKG Interpretation None      MDM   Final diagnoses:  Left leg swelling  Seizure    The patient does have slight asymmetry which could be consistent with DVT. She does report having a history of hemorrhagic brain injury but does request  treatment for possible DVT pending an ultrasound in the morning. Because of the hour, it being 11:40 PM when the patient was evaluated by myself I am unable to get Doppler studies of her legs and thus the patient will be scheduled for this as soon as possible, given oral anticoagulants until that time. The patient is aware of the risks of recurrence rehemorrhage and is willing to take that risk. Dilantin level pending.  Both myself and the pharmacist discussed with the patient the need for anticoagulation versus the risk of having prior cerebral hemorrhage. The patient is insistent on using anticoagulation. She will be scheduled for an ultrasound in the morning, the first dose was given here, a one-month prescription for the patient was given by the pharmacist. The patient has been informed of the results, her Dilantin level is slightly low, she has not had her evening  dose of medication.  Meds given in ED:  Medications  Rivaroxaban (XARELTO) tablet 15 mg (15 mg Oral Given 12/24/13 0127)  rivaroxaban Alveda Reasons) Education Kit for DVT/PE patients ( Does not apply Given 12/24/13 0129)    New Prescriptions   No medications on file      Johnna Acosta, MD 12/24/13 [phone]

## 2013-12-23 NOTE — ED Notes (Signed)
Pt coming from lobby with c/o seizure. Pt's daughter states pt was sitting in wheel chair when seizure started, slid out of chair. Daughter denies pt his head on floor. Upon arrival to trauma C pt was sitting up in bed alert, asking daughter what happened. Pt A&Ox4, respirations equal and unlabored, skin warm and dry. Pt is unaware of seizure. Daughter reports similar seizures in the past

## 2013-12-23 NOTE — ED Notes (Signed)
Pt states bilateral leg pain for three days with pressure.  Pt states history pf blood clots

## 2013-12-24 ENCOUNTER — Ambulatory Visit (HOSPITAL_COMMUNITY)
Admission: RE | Admit: 2013-12-24 | Discharge: 2013-12-24 | Disposition: A | Discharge status: Home / Self Care | Payer: Medicare HMO | Source: Ambulatory Visit | Attending: Emergency Medicine | Admitting: Emergency Medicine

## 2013-12-24 DIAGNOSIS — M7989 Other specified soft tissue disorders: Secondary | ICD-10-CM | POA: Insufficient documentation

## 2013-12-24 DIAGNOSIS — M79609 Pain in unspecified limb: Secondary | ICD-10-CM

## 2013-12-24 LAB — PHENYTOIN LEVEL, TOTAL: Phenytoin Lvl: 9.2 ug/mL — ABNORMAL LOW (ref 10.0–20.0)

## 2013-12-24 MED ORDER — RIVAROXABAN 15 MG PO TABS
15.0000 mg | ORAL_TABLET | Freq: Two times a day (BID) | ORAL | Status: DC
  Administered 2013-12-24: 15 mg via ORAL
  Filled 2013-12-24: qty 1

## 2013-12-24 MED ORDER — RIVAROXABAN (XARELTO) EDUCATION KIT FOR DVT/PE PATIENTS
PACK | Freq: Once | Status: AC
  Administered 2013-12-24: 01:00:00
  Filled 2013-12-24: qty 1

## 2013-12-24 MED ORDER — HYDROCODONE-ACETAMINOPHEN 5-325 MG PO TABS
2.0000 | ORAL_TABLET | Freq: Once | ORAL | Status: AC
  Administered 2013-12-24: 2 via ORAL
  Filled 2013-12-24: qty 2

## 2013-12-24 NOTE — Progress Notes (Signed)
ANTICOAGULATION CONSULT NOTE - Initial Consult  Pharmacy Consult for xarelto  Indication: DVT  Allergies  Allergen Reactions  . Codeine Nausea And Vomiting  . Keppra [Levetiracetam] Other (See Comments)    Causes seizures.   [name] Oil Rash    Patient Measurements:   Heparin Dosing Weight:   Vital Signs: Temp: 98.7 F (37.1 C) (05/22 2243) Temp src: Oral (05/22 2243) BP: 100/53 mmHg (05/23 0046) Pulse Rate: 81 (05/23 0046)  Labs: No results found for this basename: HGB, HCT, PLT, APTT, LABPROT, INR, HEPARINUNFRC, CREATININE, CKTOTAL, CKMB, TROPONINI,  in the last 72 hours  The CrCl is unknown because both a height and weight (above a minimum accepted value) are required for this calculation.   Medical History: Past Medical History  Diagnosis Date  . Seizures   . DVT (deep venous thrombosis)   . Asthma   . Cerebral hemorrhage   . Chronic back pain   . DDD (degenerative disc disease) 06/12/2013    Medications:   (Not in a hospital admission)  Assessment: Pt here for R/O dvt with [place] in am. Strong suspicion of clot. Has had seizure while here but long hx of such.  Had seizure in lobby. Labs pending. Has been d/c from neuro practice for non-compliance. Will stress compliance with patient.  Goal of Therapy:  Full dose xarelto 15bi x 21 days then 20 mg daily   Plan:  Give 15 mg now and and additional tablet for D/c to cover for 24 hours.   Curlene Dolphin 12/24/2013,12:50 AM

## 2013-12-24 NOTE — Progress Notes (Signed)
VASCULAR LAB PRELIMINARY  PRELIMINARY  PRELIMINARY  PRELIMINARY  Left lower extremity venous Doppler completed.    Preliminary report:  There is no DVT or SVT noted in the left lower extremity.   Iantha Fallen, RVT 12/24/2013, 9:10 AM

## 2013-12-24 NOTE — Discharge Instructions (Signed)
You have been scheduled for an ultrasound of your legs in the morning here at [facility]. You have also been given a prescription for the medication that you will need from the pharmacist. If your ultrasound shows that you have a positive blood clot, you will need to start taking that medication immediately. Call Dr. Maudie Mercury for an appointment for followup on Tuesday.  Take your seizure medication as soon as you get home if you have not taken it yet today. Your level was slightly low.  Please call your doctor for a followup appointment within 24-48 hours. When you talk to your doctor please let them know that you were seen in the emergency department and have them acquire all of your records so that they can discuss the findings with you and formulate a treatment plan to fully care for your new and ongoing problems.

## 2014-01-31 ENCOUNTER — Other Ambulatory Visit: Payer: Self-pay | Admitting: Diagnostic Neuroimaging

## 2014-01-31 NOTE — Telephone Encounter (Signed)
Per note from 05/08, patient has been dismissed from the practice.

## 2014-02-02 ENCOUNTER — Other Ambulatory Visit: Payer: Self-pay | Admitting: Diagnostic Neuroimaging

## 2014-02-02 NOTE — Telephone Encounter (Signed)
[name] Ducking, MD at 12/09/2013 4:12 PM     Status: Signed        This is the third no-show for this patient. The patient will be discharged from our practice

## 2014-02-09 ENCOUNTER — Other Ambulatory Visit: Payer: Self-pay | Admitting: Internal Medicine

## 2014-02-09 DIAGNOSIS — M545 Low back pain, unspecified: Secondary | ICD-10-CM

## 2014-02-24 ENCOUNTER — Other Ambulatory Visit: Payer: Medicare HMO

## 2014-04-08 IMAGING — CT CT CERVICAL SPINE W/O CM
5 of 6 series · 15 of 33 positions shown, 17 images · non-contrast
Comparison: 08/16/2010 MRI and CT.

CLINICAL DATA: Fall, head laceration.

CT HEAD WITHOUT CONTRAST,CT CERVICAL SPINE WITHOUT CONTRAST
TECHNIQUE: Contiguous axial images were obtained from the base of
the skull through the vertex without contrast.,Technique:
Multidetector CT imaging of the cervical spine was performed.
Multiplanar CT image reconstructions were also generated.

[Series 4: head trauma 2.4 h60s bone · axial · 0.45mm/px · z∈[-89,-29]mm · 2 of 72 slices shown]
[im 24/72  bone]
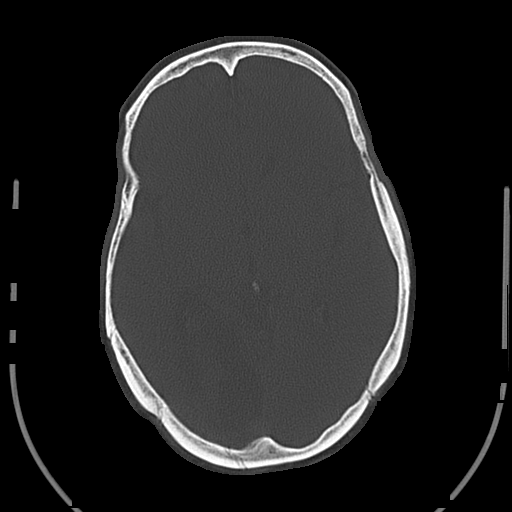
[im 48/72  bone]
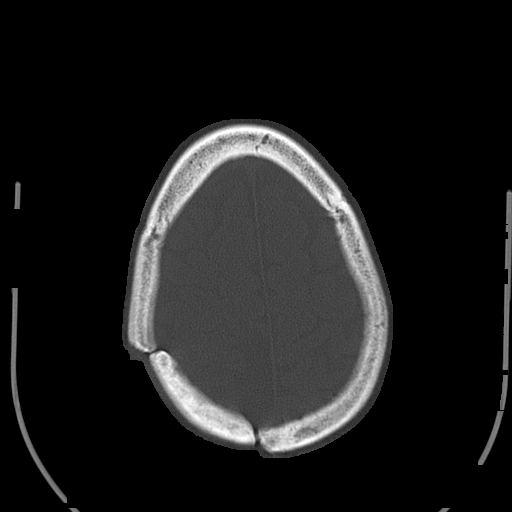

[Series 6: c_spine 2.0 b31s detail · axial · 0.23mm/px · z∈[-278,-188]mm · 3 of 91 slices shown, 4 images]
[im 23/91  soft-tissue]
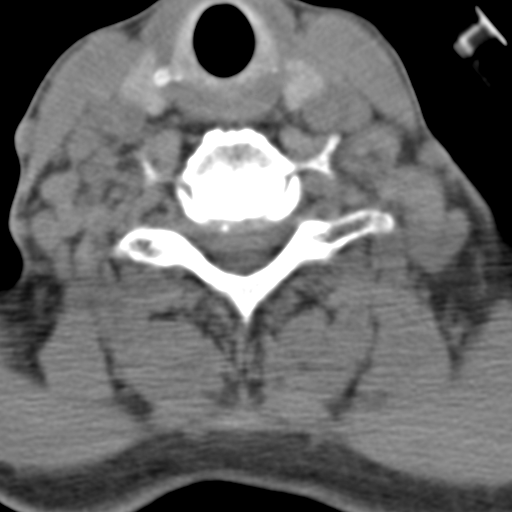
[im 23/91  bone]
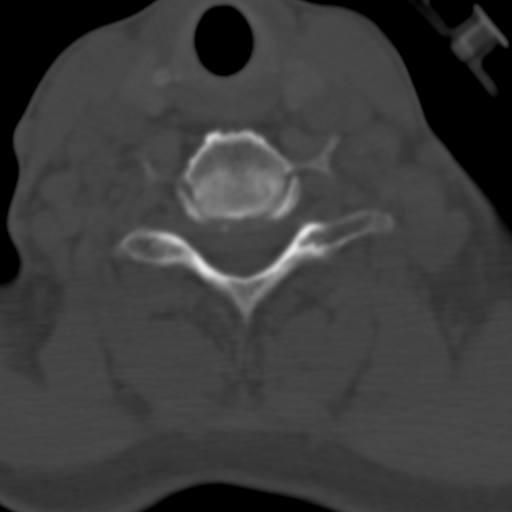
[im 46/91  bone]
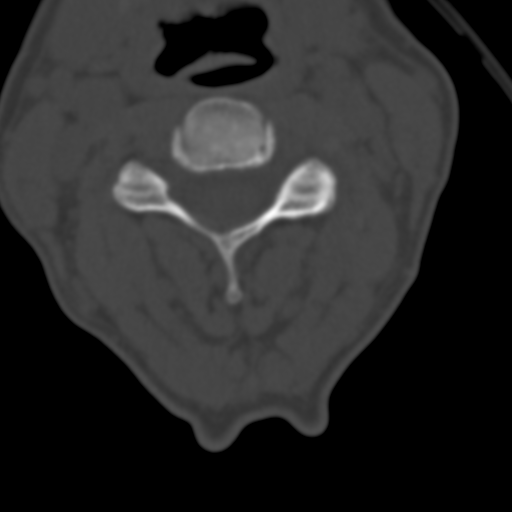
[im 68/91  bone]
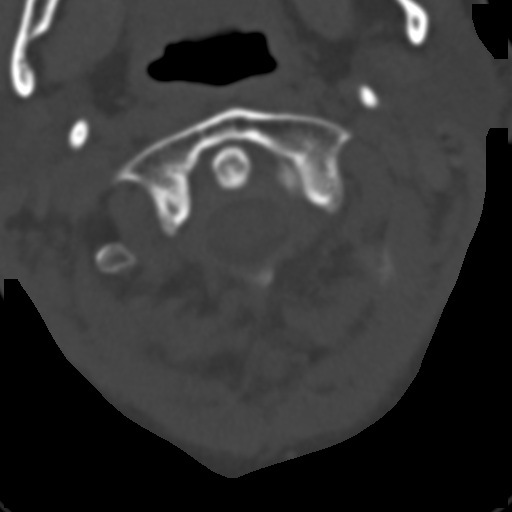

[Series 602: cor · coronal · 0.36mm/px · 3 of 48 slices shown]
[im 10/48  bone]
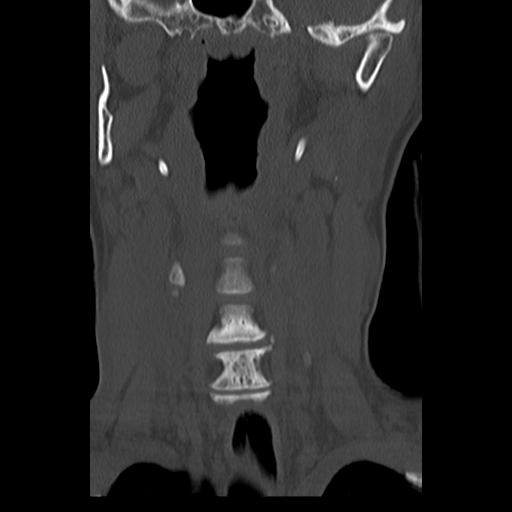
[im 19/48  bone]
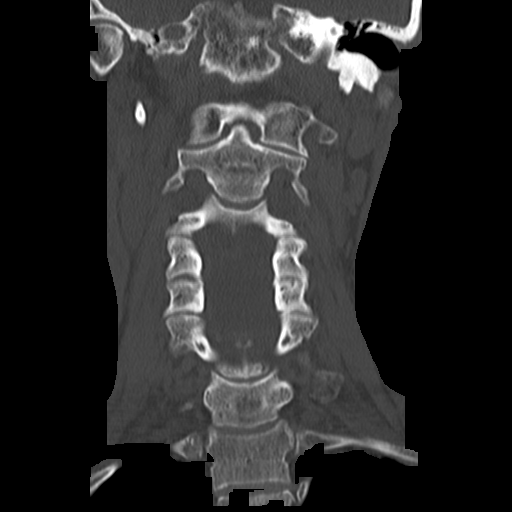
[im 29/48  bone]
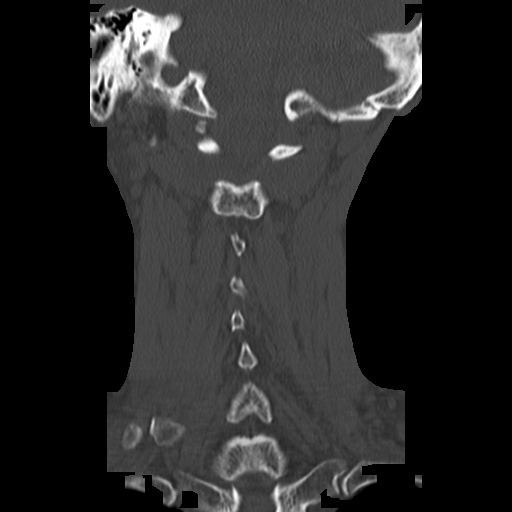

[Series 603: orthog · axial · 0.36mm/px · z∈[-287,-236]mm · 2 of 78 slices shown]
[im 26/78  bone]
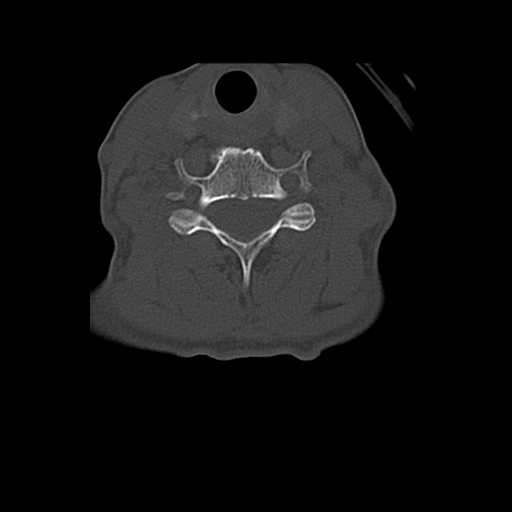
[im 52/78  bone]
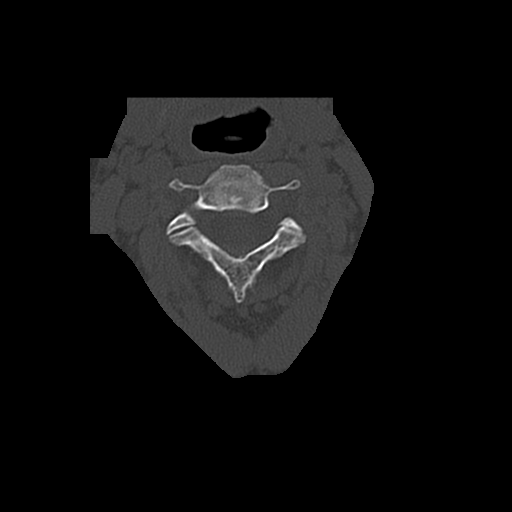

[Series 604: sag · sagittal · 0.36mm/px · 5 of 31 slices shown, 6 images]
[im 11/31  bone]
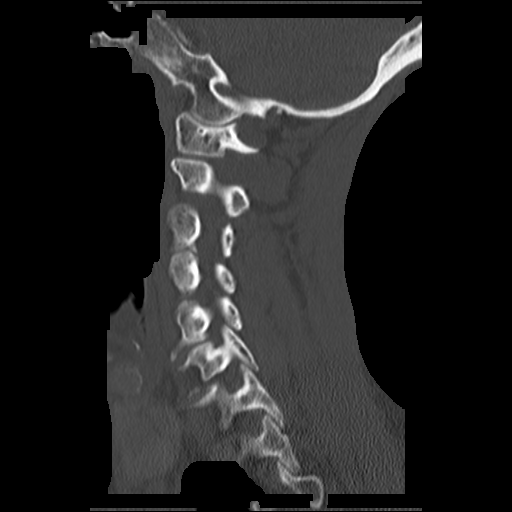
[im 13/31  bone]
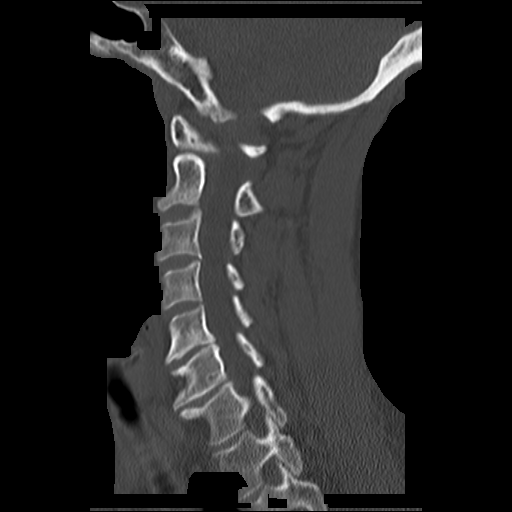
[im 16/31  soft-tissue]
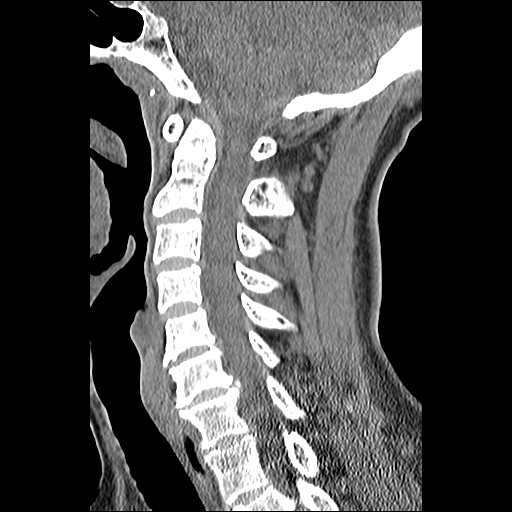
[im 16/31  bone]
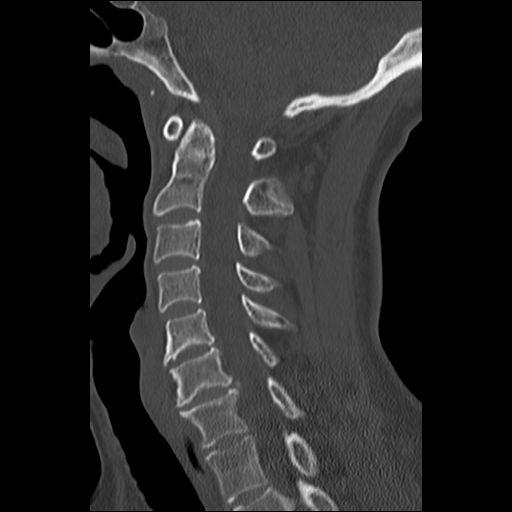
[im 18/31  bone]
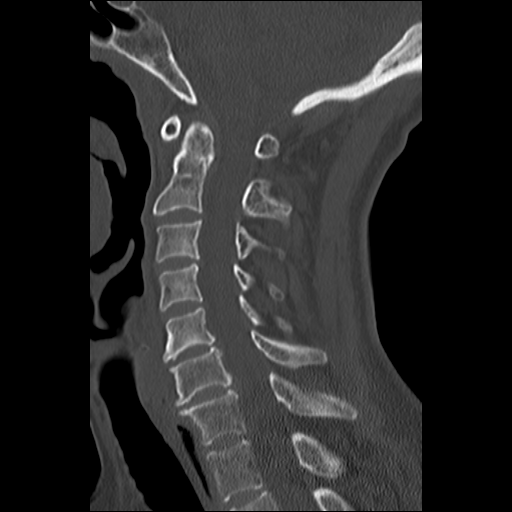
[im 21/31  bone]
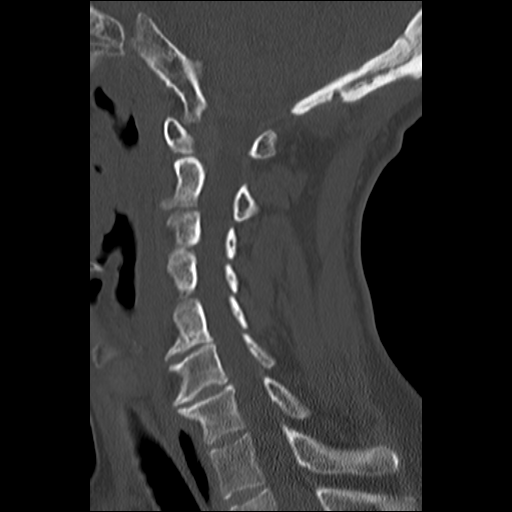

[15 of 33 positions shown; findings below may reference images not displayed]

FINDINGS: Head:  Right occipital lobe encephalomalacia is again noted. There
has been interval development of curvilinear areas of high
attenuation intermixed with the encephalomalacia and along the
convexity of the occipital craniotomy, concerning for extra axial
hemorrhage. No significant mass effect.

Otherwise, maintained gray-white differentiation.  No definite
intraparenchymal hemorrhage or mass.  No definite CT evidence of
acute infarction. No overt hydrocephalus. The craniotomy defect
appears similar to priors. There is a right occipital paramidline
nondisplaced fracture just below the level the craniotomy, which
extends to midline as the fracture line extends inferiorly. There
is associated overlying soft tissue swelling Mild opacification of
the left mastoid air cells.  Otherwise, the visualized paranasal
sinuses and mastoid air cells are predominately clear.

Cervical spine: Lung apices are predominately clear.  Nonspecific
nodule measuring 1.1 cm arising from the right lobe of the thyroid
gland.  Maintained craniocervical relationship.  No dens fracture.
Multilevel degenerative changes, most pronounced at C5-6 and C6-7.
No paravertebral soft tissue abnormality.  No aggressive osseous
lesion.
IMPRESSION: Mildly displaced right occipital bone fracture.  Interval
development of high attenuation within the right occipital lobe
encephalomalacia is concerning for extra axial hemorrhage.  No
significant mass effect at this time.

Partially opacified left mastoid air cells.  Correlate clinically
concerned for traumatic effusion or mastoiditis.

Multilevel cervical spine degenerative changes.  No acute fracture
or dislocation identified.

Critical Value/emergent results were called by telephone at the
time of interpretation on 02/27/2012 at [DATE] p.m. to Dr. Sorin,
who verbally acknowledged these results.

## 2014-04-08 IMAGING — CR DG CHEST 2V
2 series · 2 of 2 positions shown · non-contrast
Comparison: August 16, 2010.

CLINICAL DATA: Fall

CHEST - 2 VIEW

[w chest lat]
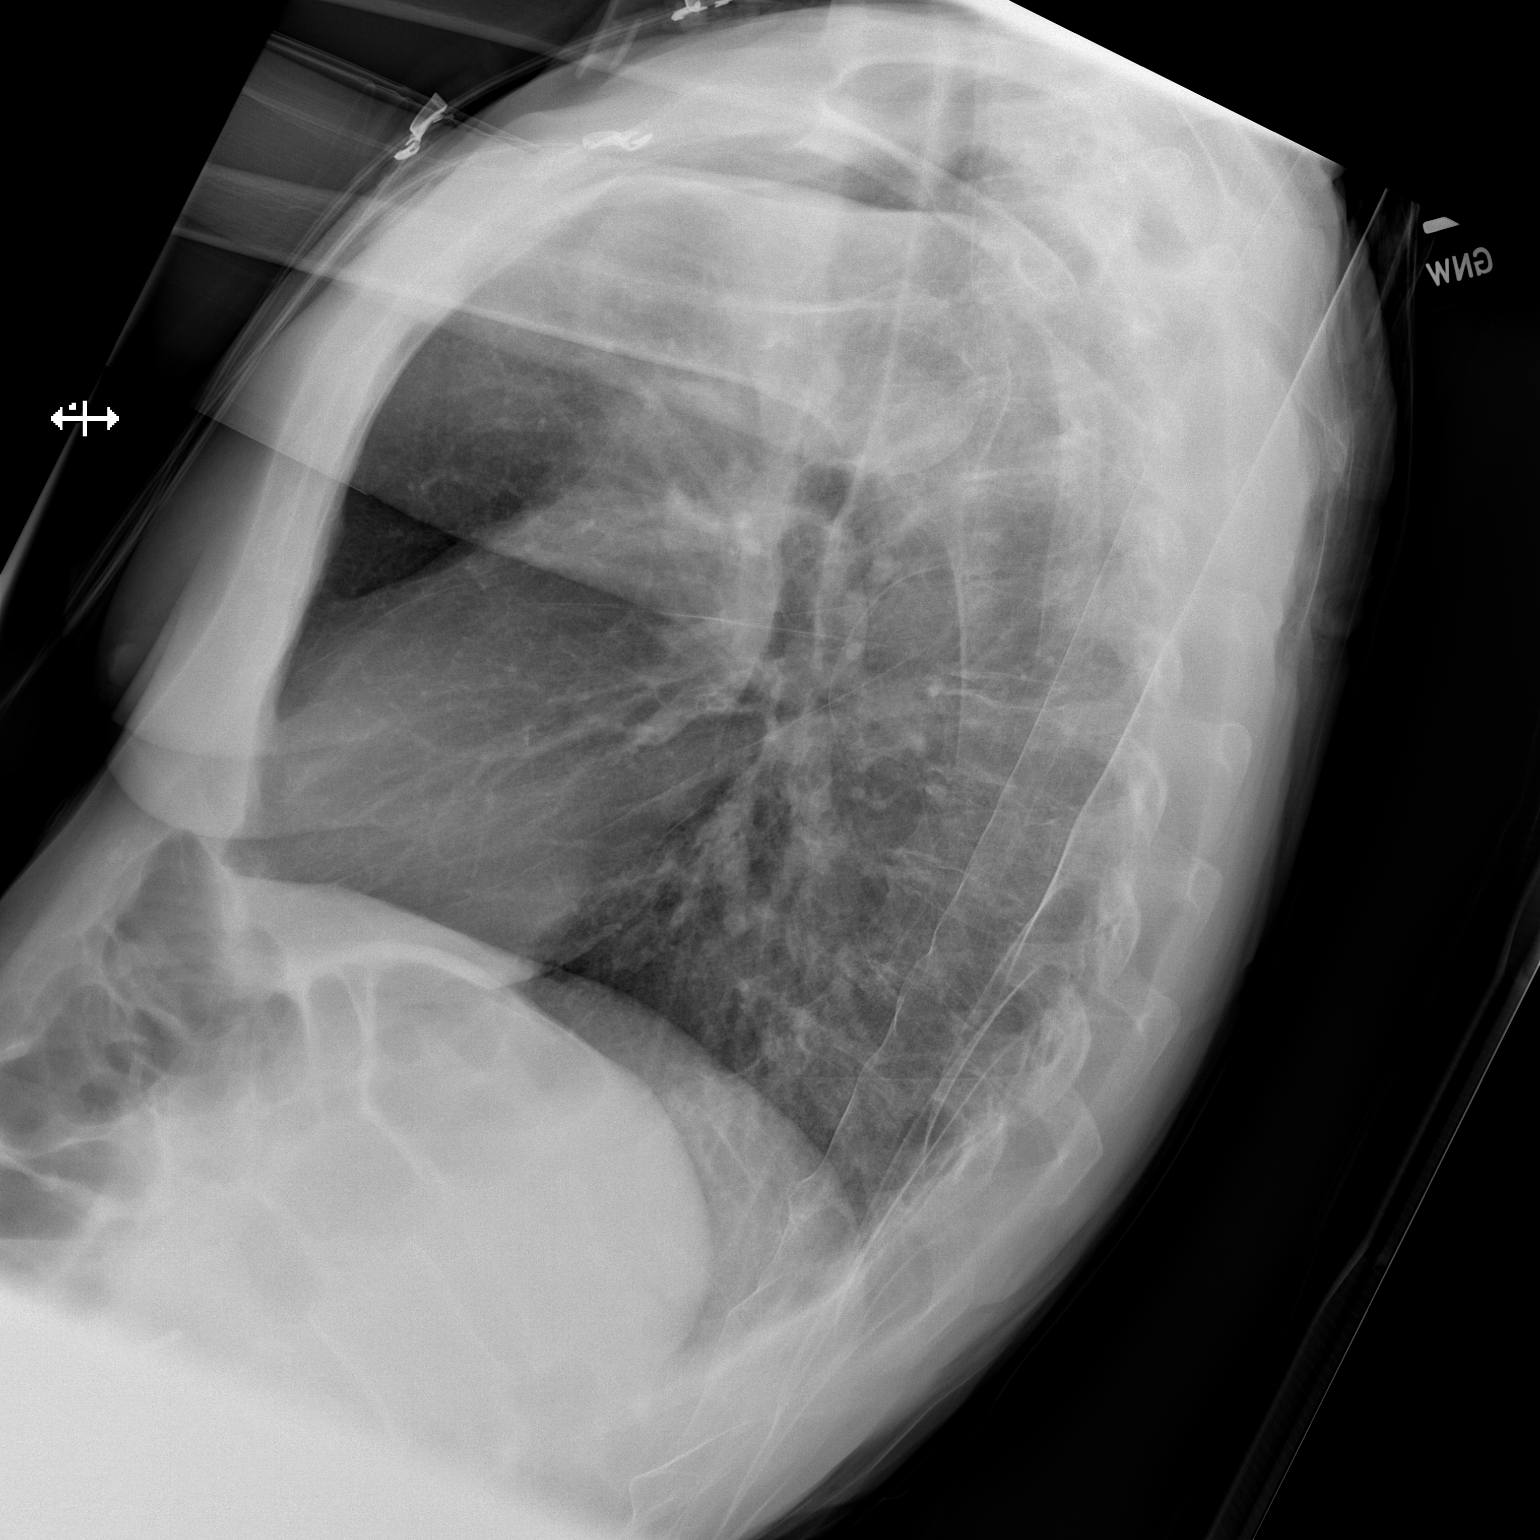

[x chest ap]
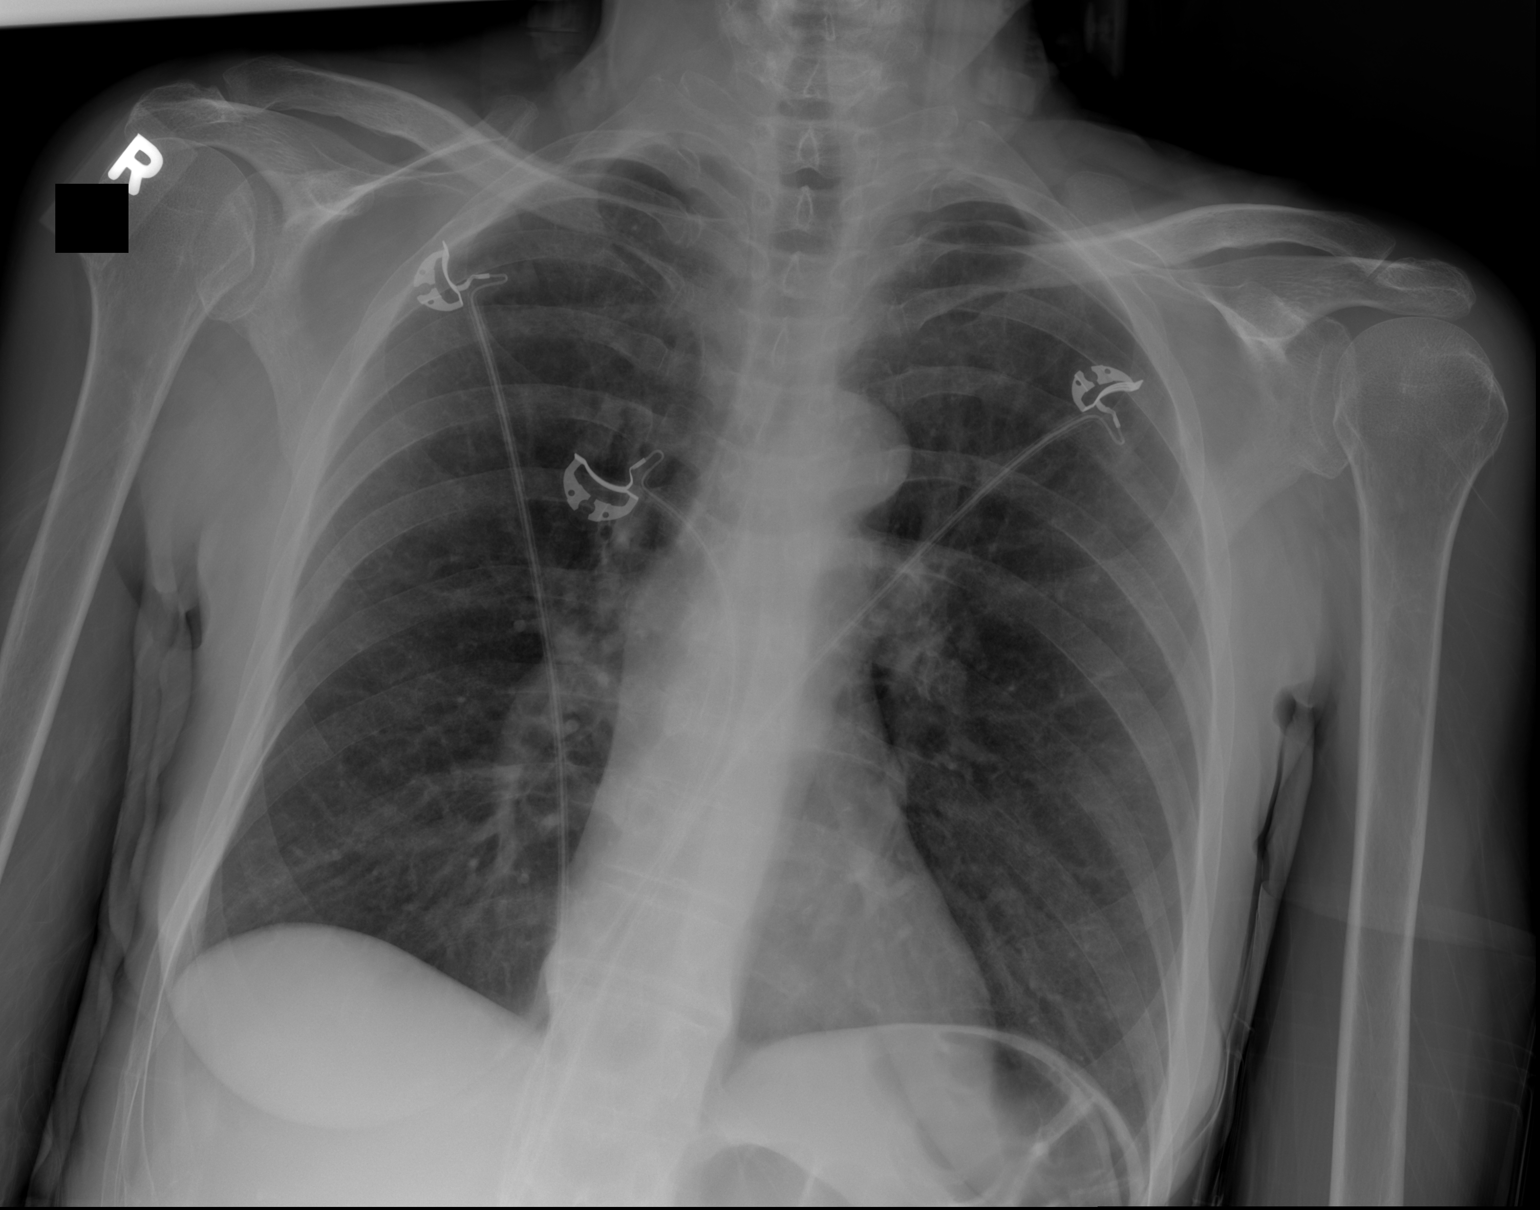

[2 of 2 positions shown; findings below may reference images not displayed]

FINDINGS: Cardiomediastinal silhouette appears normal.  No acute
pulmonary disease is noted.  Bony thorax appears intact.
IMPRESSION: No acute cardiopulmonary abnormality seen.

## 2014-04-09 IMAGING — CT CT HEAD W/O CM
1 of 2 series · 15 of 30 positions shown, 19 images · non-contrast
Comparison: 02/27/2012

CLINICAL DATA: Fall.  Follow-up intracranial hemorrhage.

CT HEAD WITHOUT CONTRAST
TECHNIQUE: Contiguous axial images were obtained from the base of
the skull through the vertex without contrast.

[Series 3: recon 2: brain · axial · 0.47mm/px · z∈[+117,+260]mm · 15 of 64 slices shown, 19 images]
[im 4/64  brain]
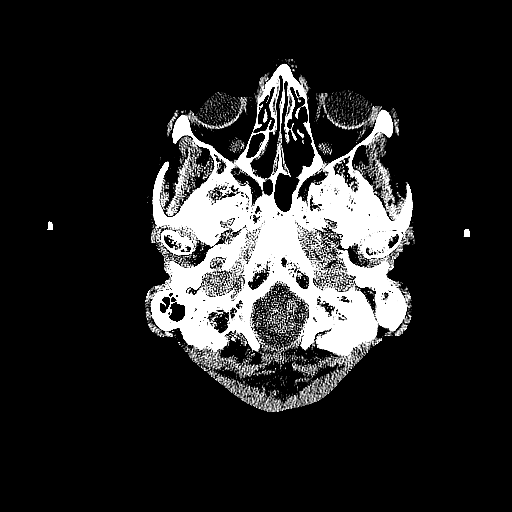
[im 4/64  bone]
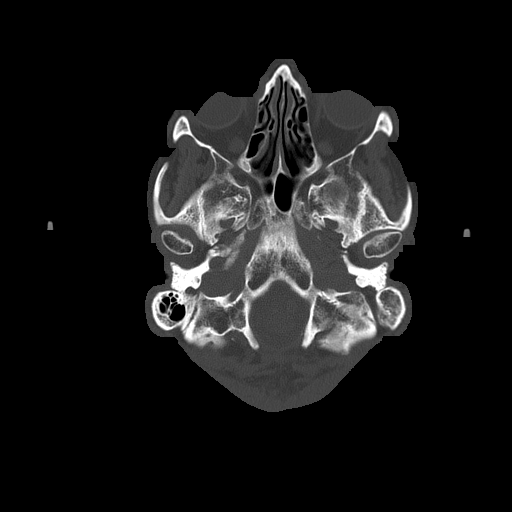
[im 7/64  brain]
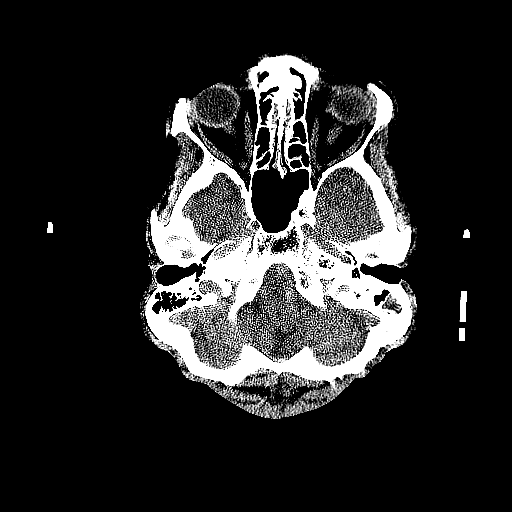
[im 14/64  brain]
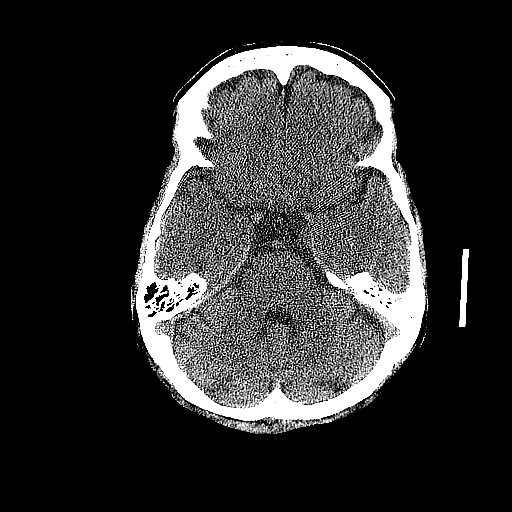
[im 17/64  brain]
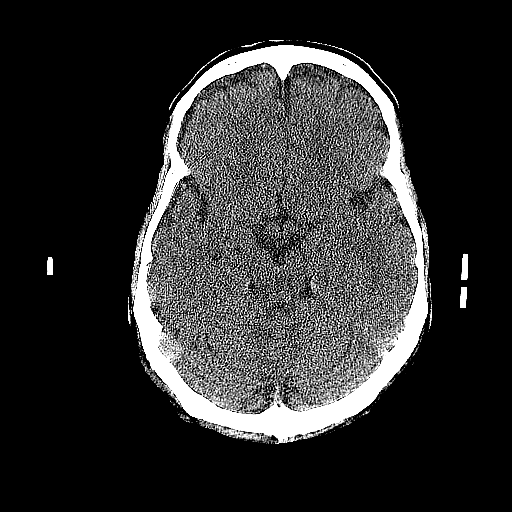
[im 20/64  brain]
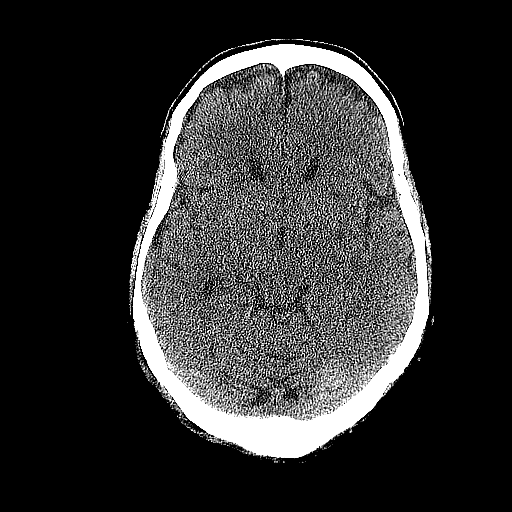
[im 20/64  bone]
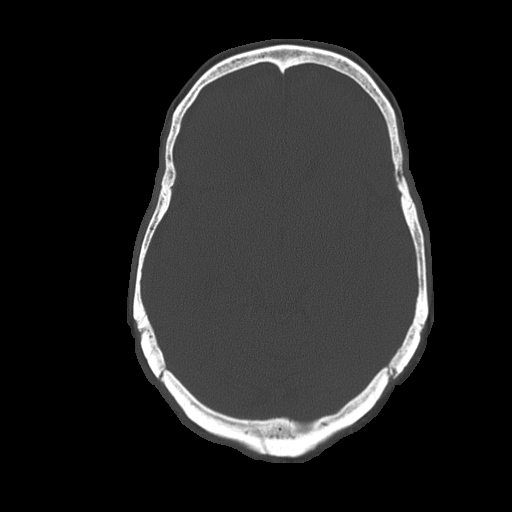
[im 24/64  brain]
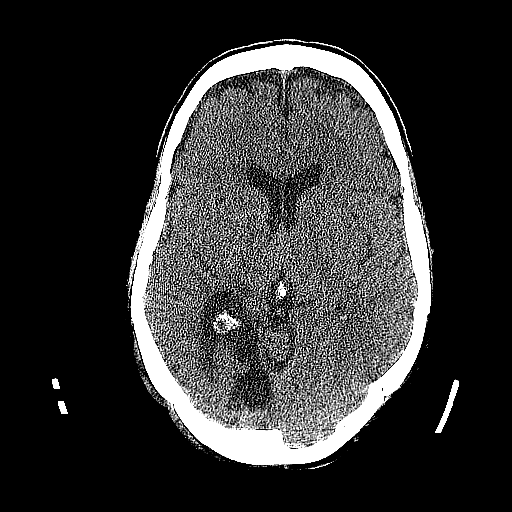
[im 27/64  brain]
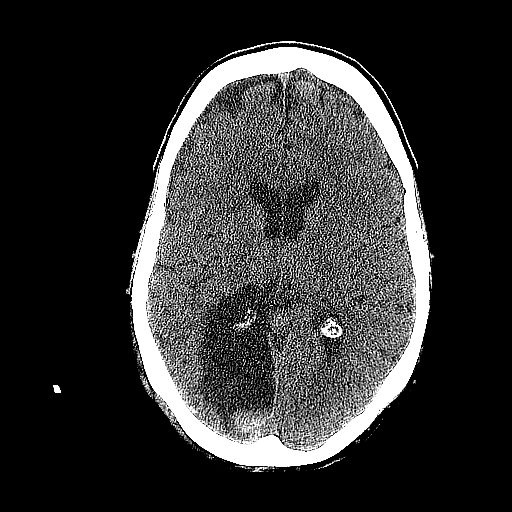
[im 34/64  brain]
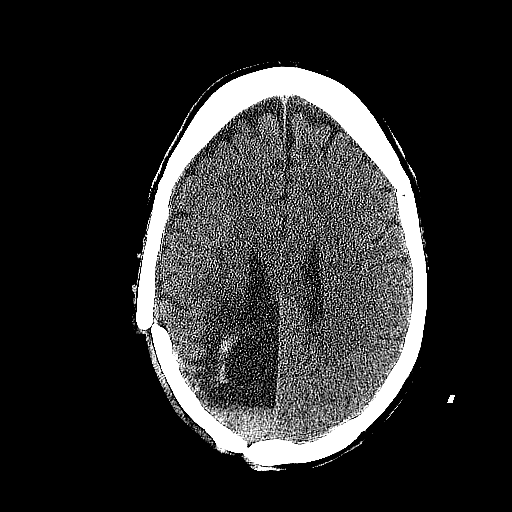
[im 37/64  brain]
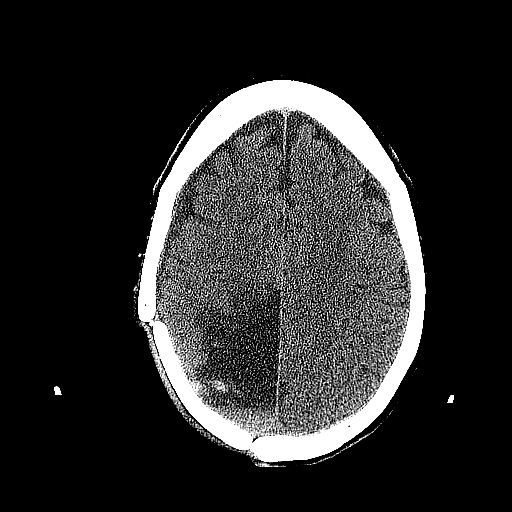
[im 37/64  bone]
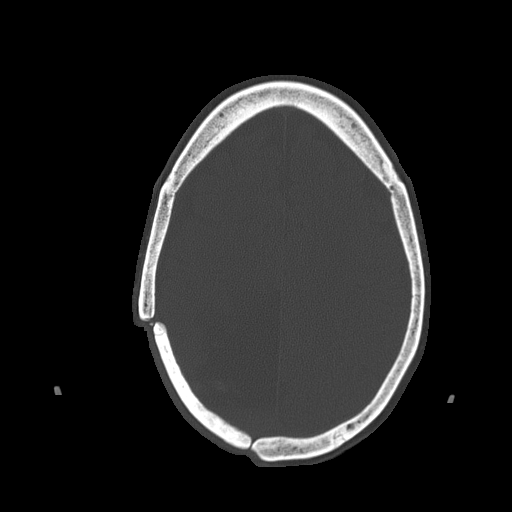
[im 40/64  brain]
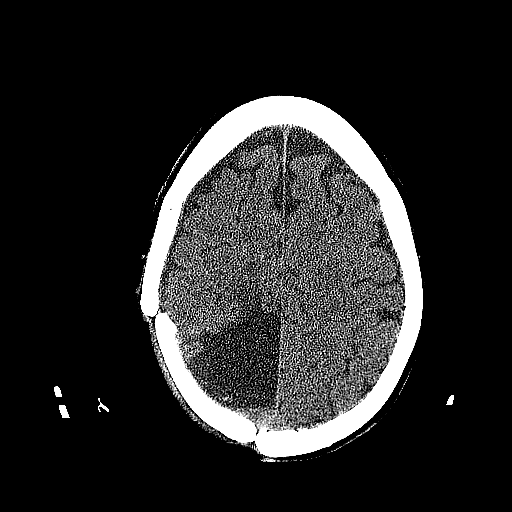
[im 44/64  brain]
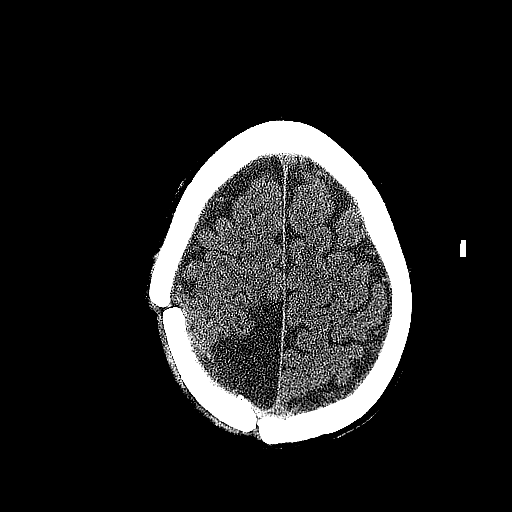
[im 47/64  brain]
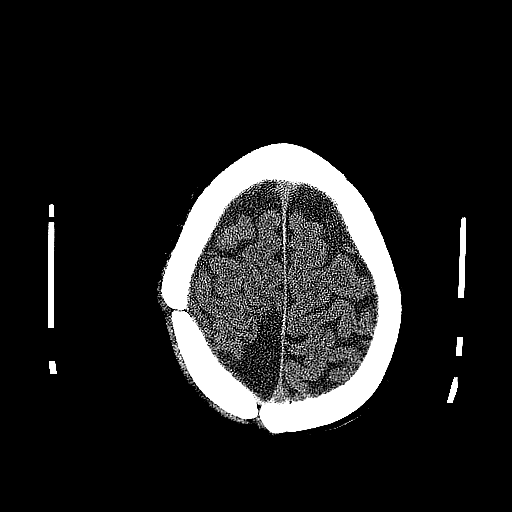
[im 54/64  brain]
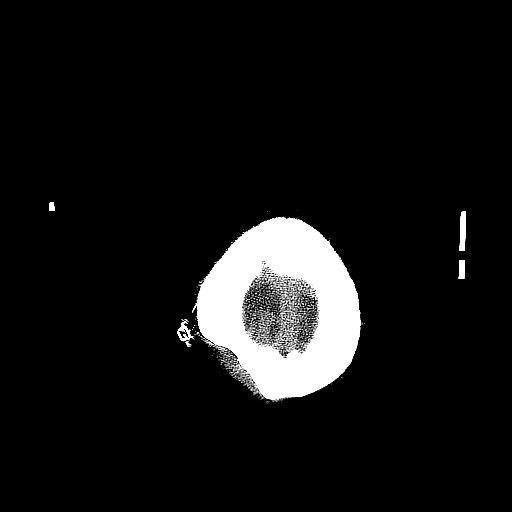
[im 54/64  bone]
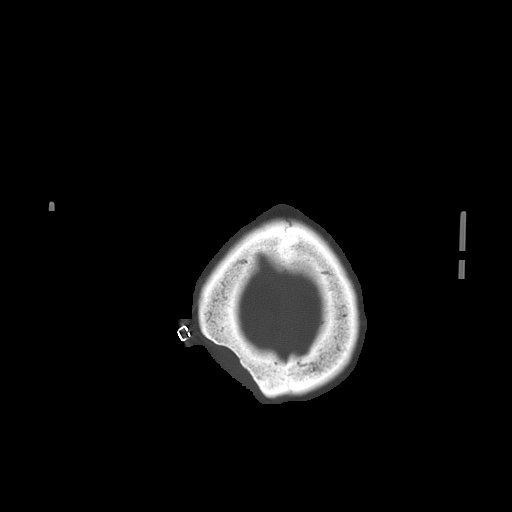
[im 57/64  brain]
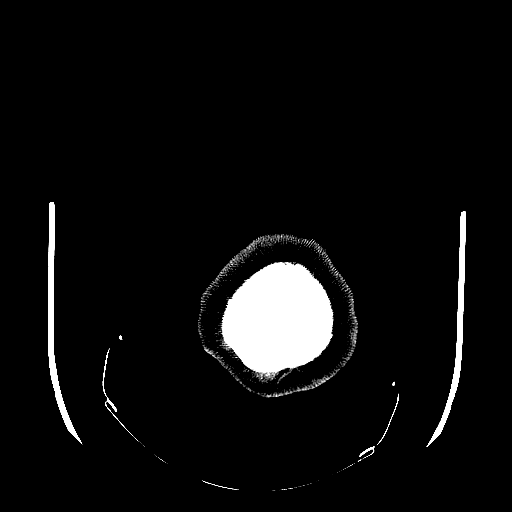
[im 60/64  brain]
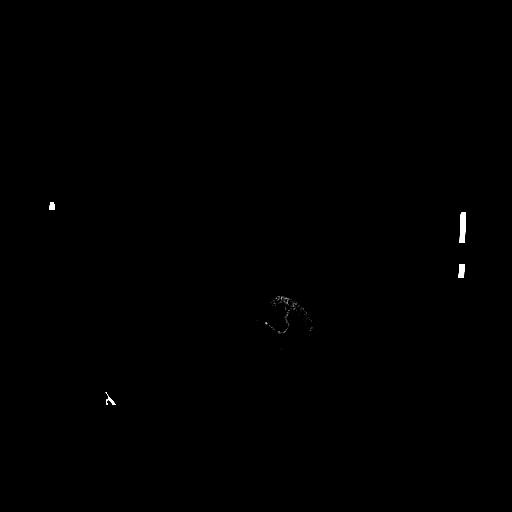

[15 of 30 positions shown; findings below may reference images not displayed]

FINDINGS: Prior   right parietal craniotomy.  Craniotomy flap is
depressed but unchanged from earlier studies.

There is encephalomalacia in the right occipital parietal lobe.
This has been present previously.  There is now blood layering in
this encephalomalacia cavity which communicates with the lateral
ventricles.  There is also some blood in the left occipital horn.
Previously there was some blood in the cavity which has now layered
posteriorly.  It is difficult determine if there has been any
further hemorrhage due to the dependent settling of blood since
yesterday's study.

Negative for hydrocephalus.  No acute infarct or mass. Nondisplaced
right occipital fracture again noted.
IMPRESSION: High density hemorrhage is now layering in the postsurgical cavity
in the right occipital parietal lobe.  There is some blood also in
the left occipital horn.  No hydrocephalus.

## 2014-04-10 IMAGING — CT CT HEAD W/O CM
1 of 2 series · 13 of 30 positions shown, 17 images · non-contrast
Comparison: Yesterday

CLINICAL DATA: Follow-up intracranial hemorrhage.

CT HEAD WITHOUT CONTRAST
TECHNIQUE: Contiguous axial images were obtained from the base of
the skull through the vertex without contrast.

[Series 2: brain · axial · 0.47mm/px · z∈[+114,+237]mm · 13 of 28 slices shown, 17 images]
[im 2/28  brain]
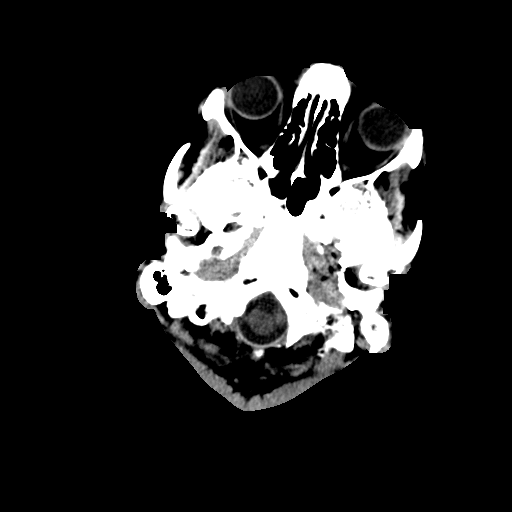
[im 2/28  bone]
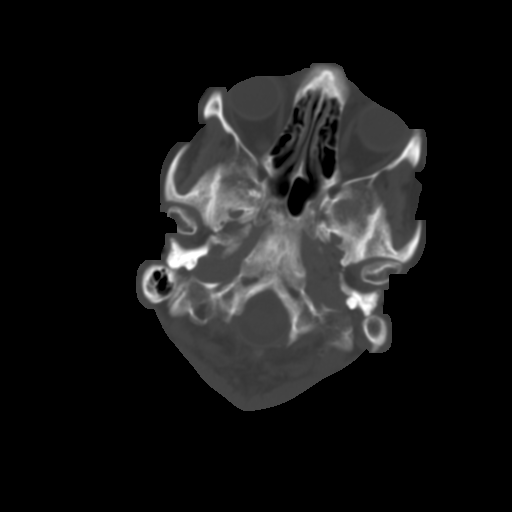
[im 4/28  brain]
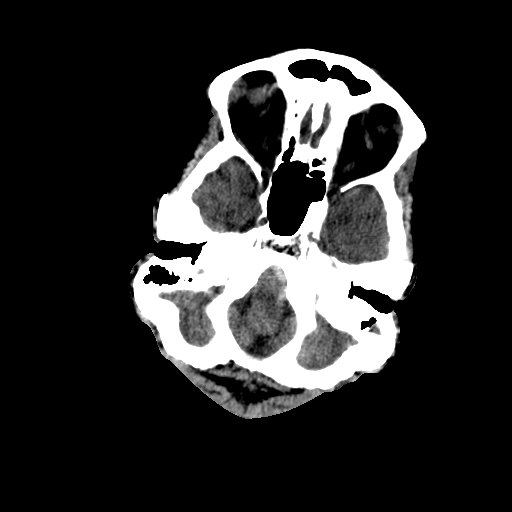
[im 6/28  brain]
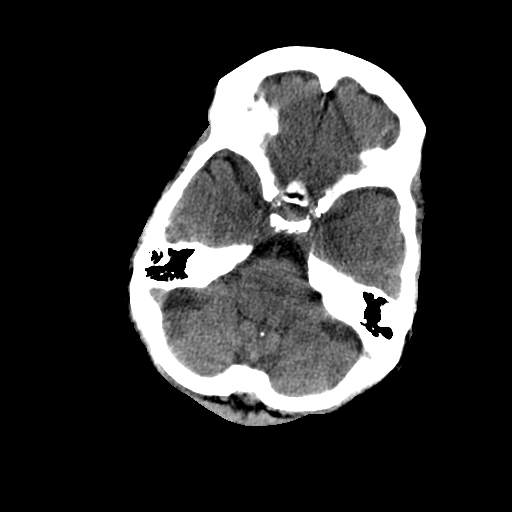
[im 8/28  brain]
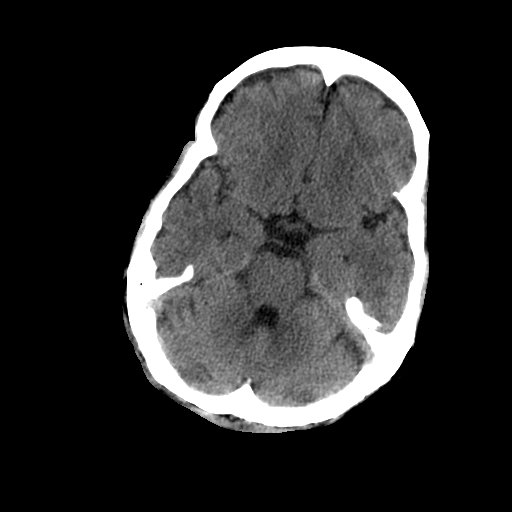
[im 10/28  brain]
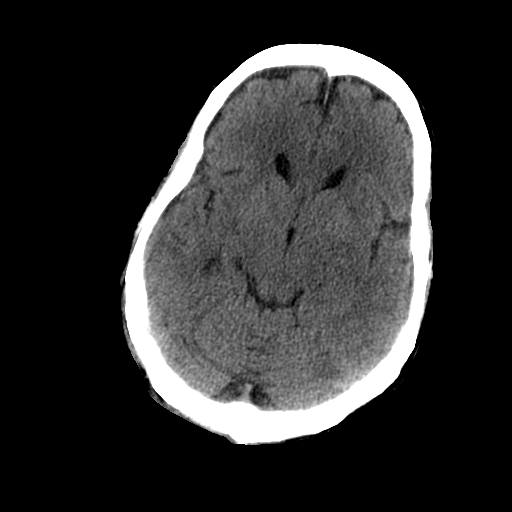
[im 10/28  bone]
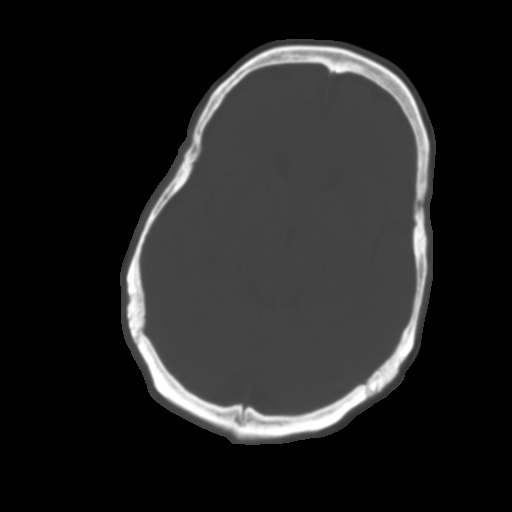
[im 12/28  brain]
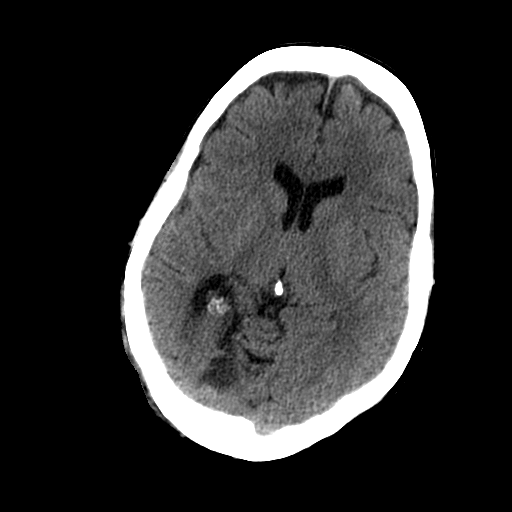
[im 14/28  brain]
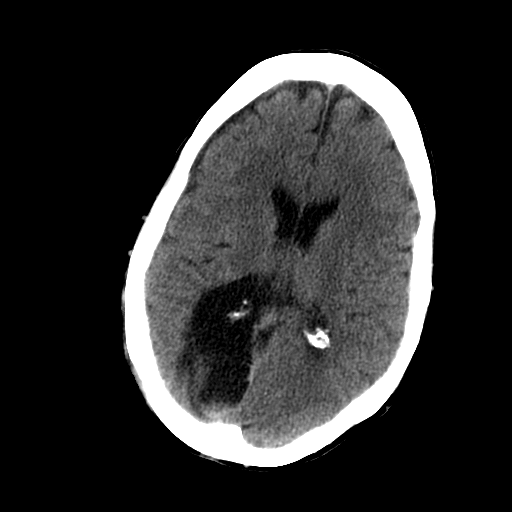
[im 16/28  brain]
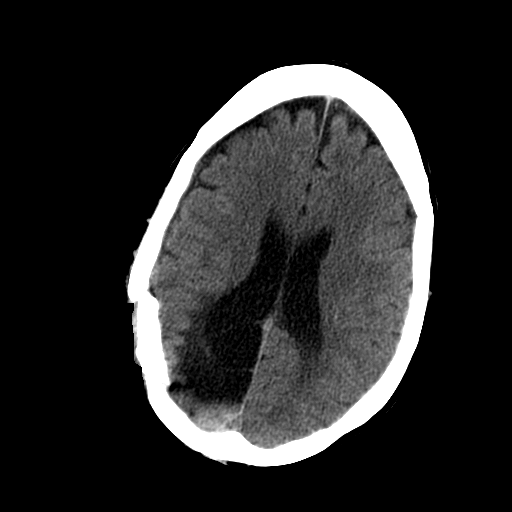
[im 18/28  brain]
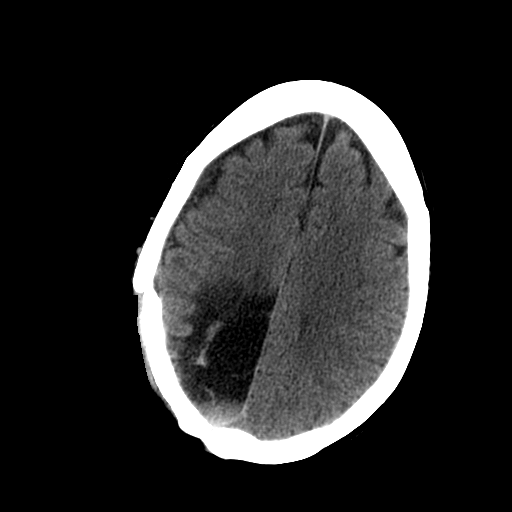
[im 18/28  bone]
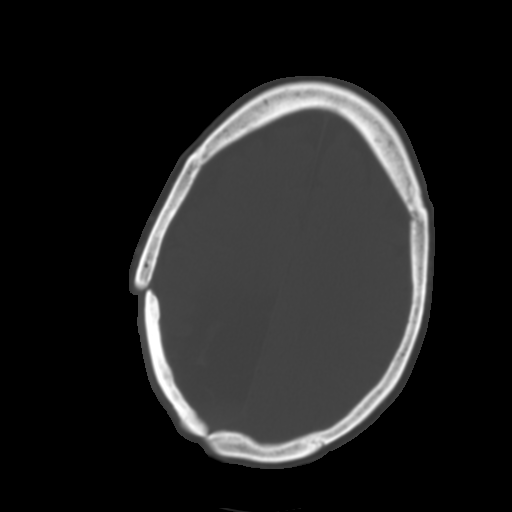
[im 20/28  brain]
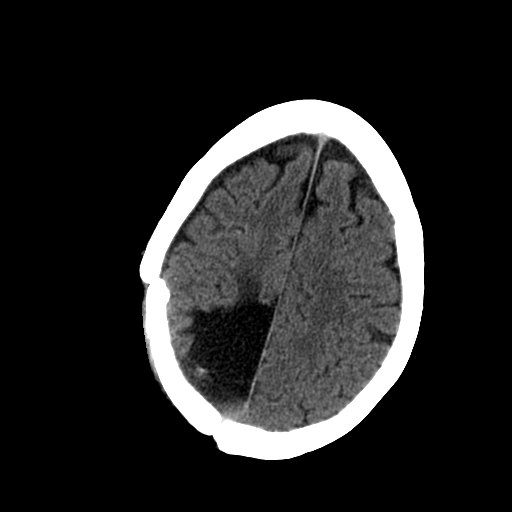
[im 22/28  brain]
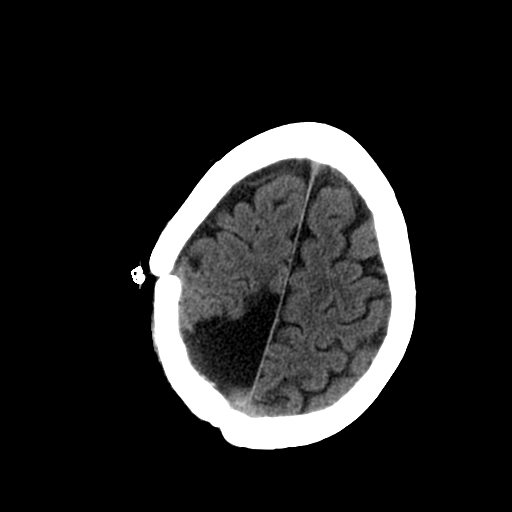
[im 24/28  brain]
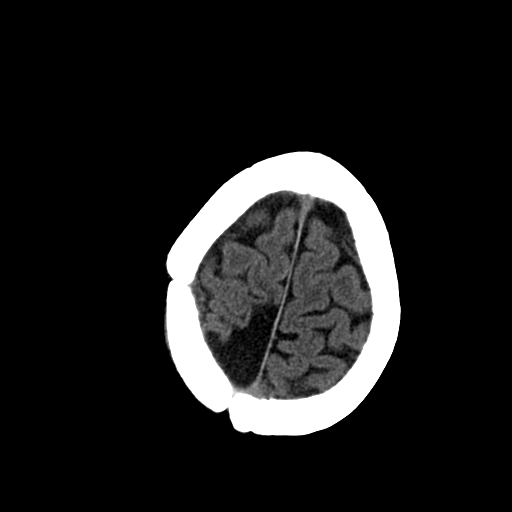
[im 26/28  brain]
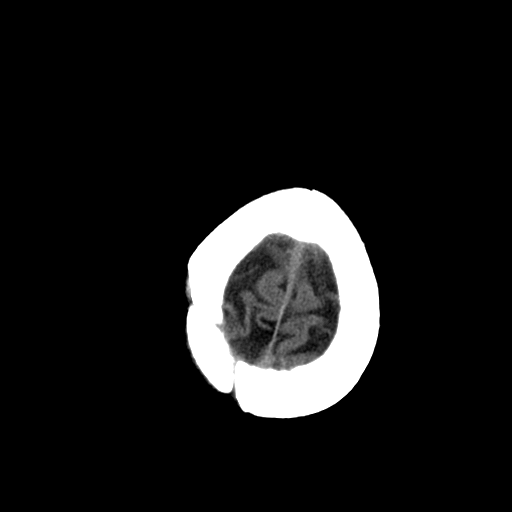
[im 26/28  bone]
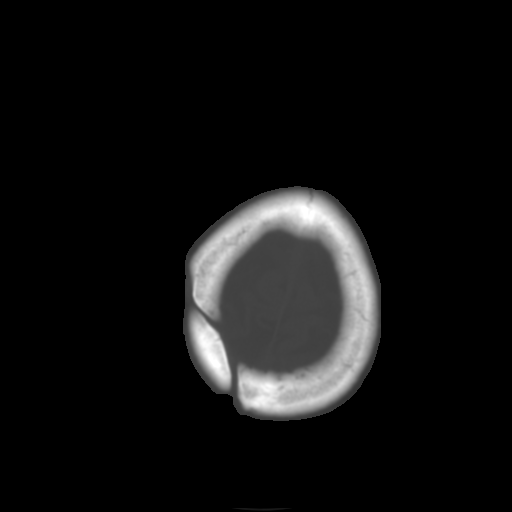

[13 of 30 positions shown; findings below may reference images not displayed]

FINDINGS: Hemorrhage layering in the left occipital horn has nearly
resolved.  Hemorrhage layering in the encephalomalacia cavity of
the right occipital lobe has markedly improved.  Right
parietooccipital craniotomy fragment is slightly depressed and
stable.  No new hemorrhage.  No mass effect or midline shift.
Ventricles system is stable in size.  No new area of low density to
suggest acute ischemic change.
IMPRESSION: Improved hemorrhage within the left occipital horn of the left
lateral ventricle and in the right occipital encephalomalacia
cavity.  No developing hydrocephalus.

## 2014-04-12 IMAGING — CT CT HEAD W/O CM
2 of 4 series · 15 of 30 positions shown, 18 images · non-contrast
Comparison: 02/29/2012.

CLINICAL DATA: Headache.  Nausea.  Chest pain.  History of fall
with skull fracture and intracranial hemorrhage.

CT HEAD WITHOUT CONTRAST
TECHNIQUE: Contiguous axial images were obtained from the base of
the skull through the vertex without contrast.

[Series 2: brain · axial · 0.47mm/px · z∈[+165,+267]mm · 5 of 32 slices shown]
[im 6/32  brain]
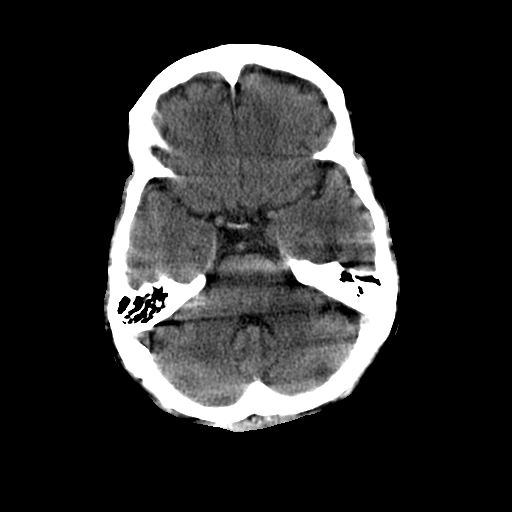
[im 11/32  brain]
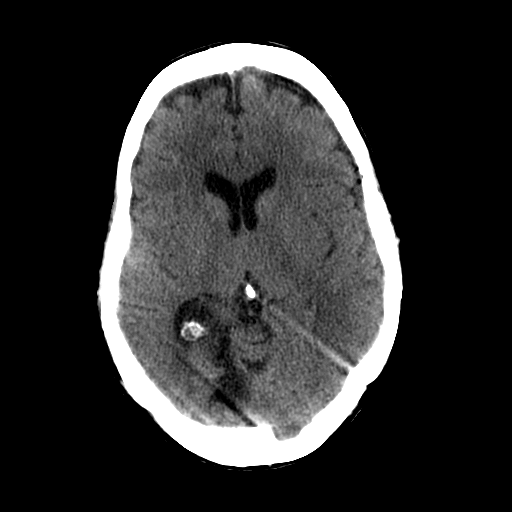
[im 16/32  brain]
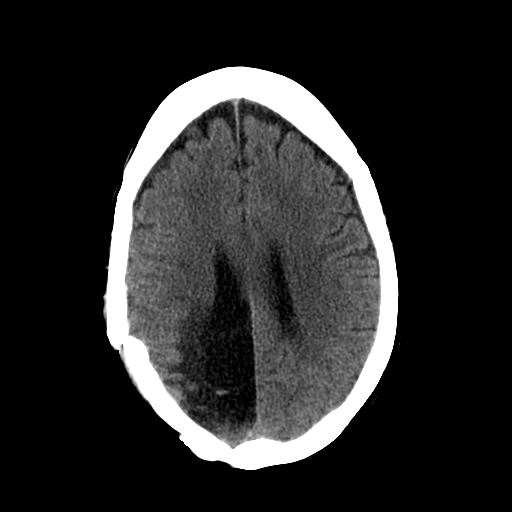
[im 21/32  brain]
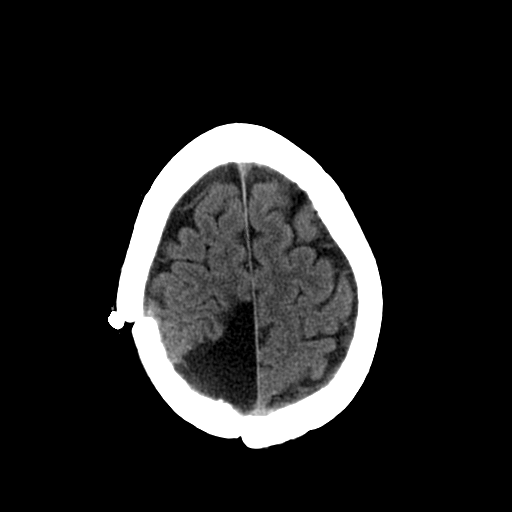
[im 26/32  brain]
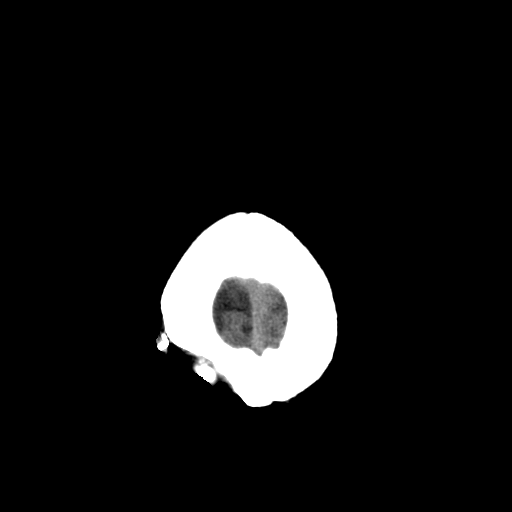

[Series 3: recon 2: brain · axial · 0.47mm/px · z∈[+151,+271]mm · 10 of 64 slices shown, 13 images]
[im 6/64  brain]
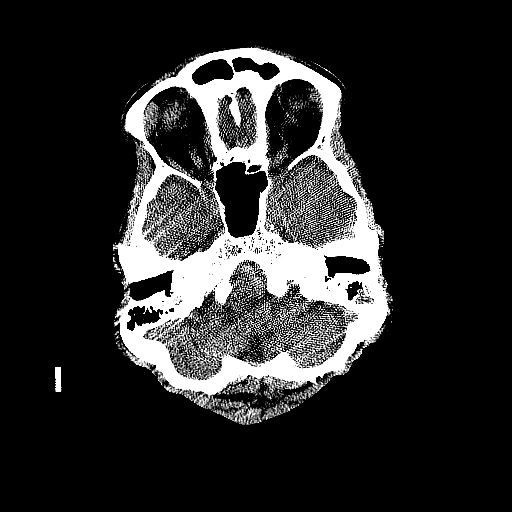
[im 6/64  bone]
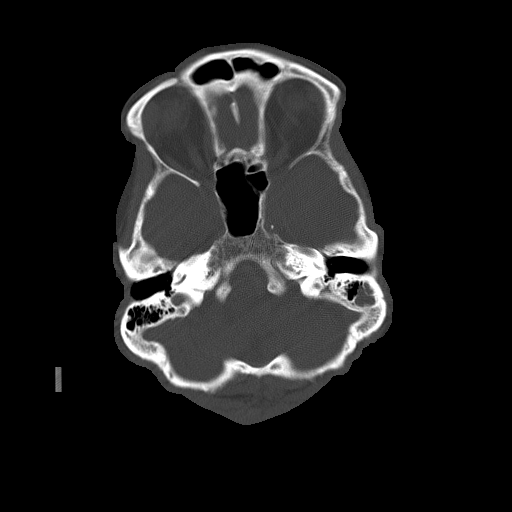
[im 11/64  brain]
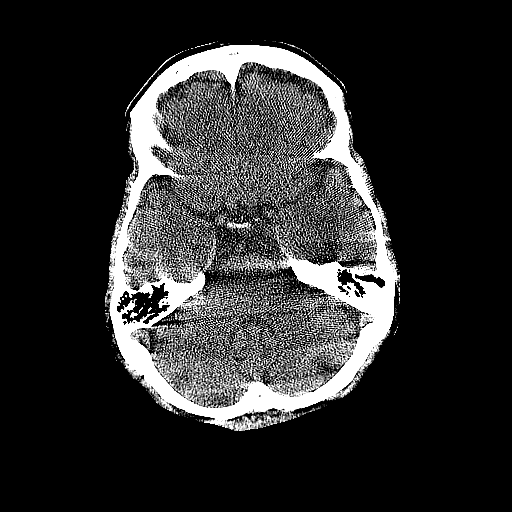
[im 16/64  brain]
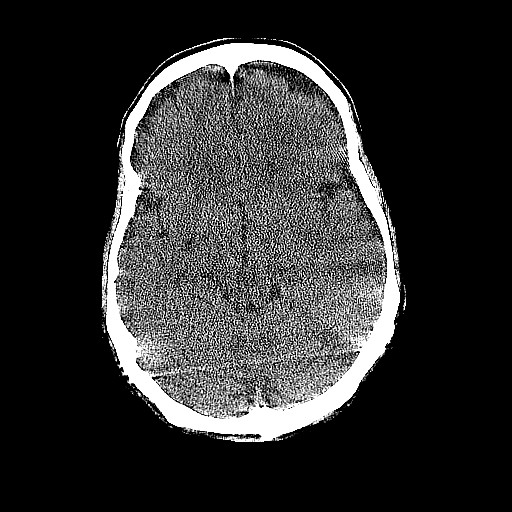
[im 22/64  brain]
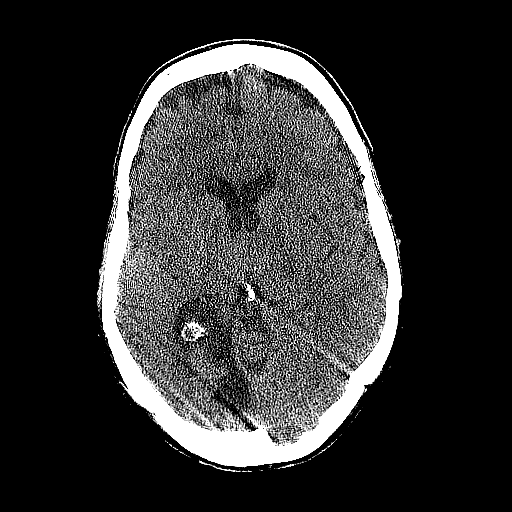
[im 27/64  brain]
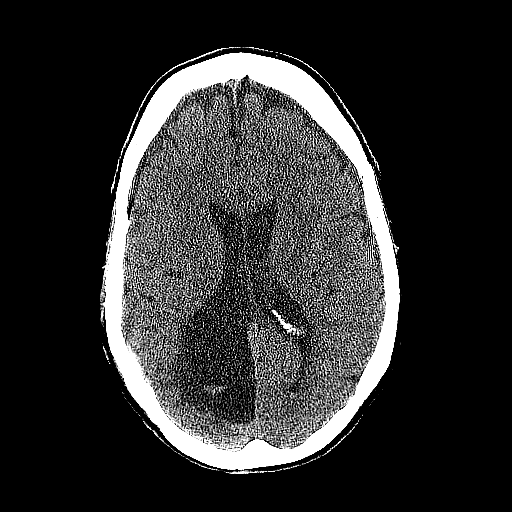
[im 27/64  bone]
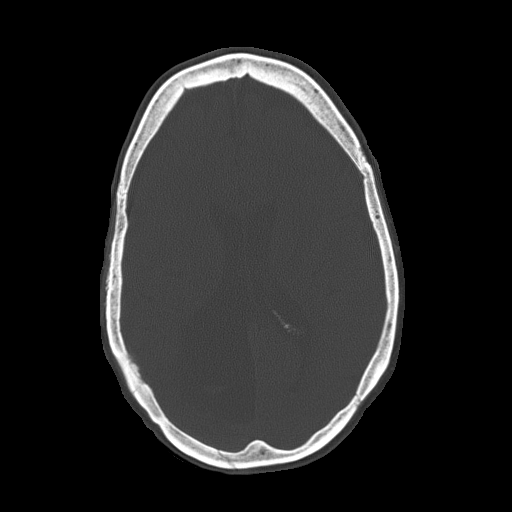
[im 32/64  brain]
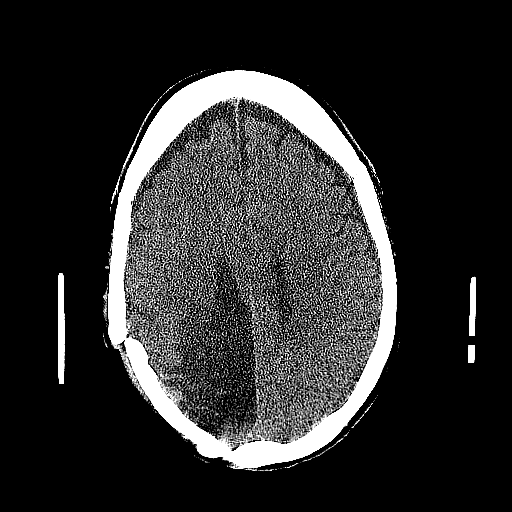
[im 37/64  brain]
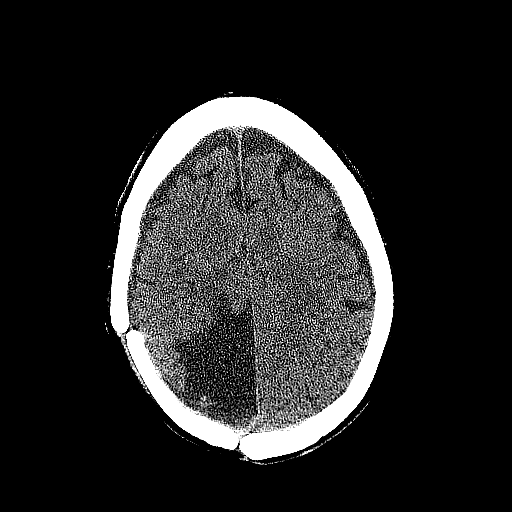
[im 43/64  brain]
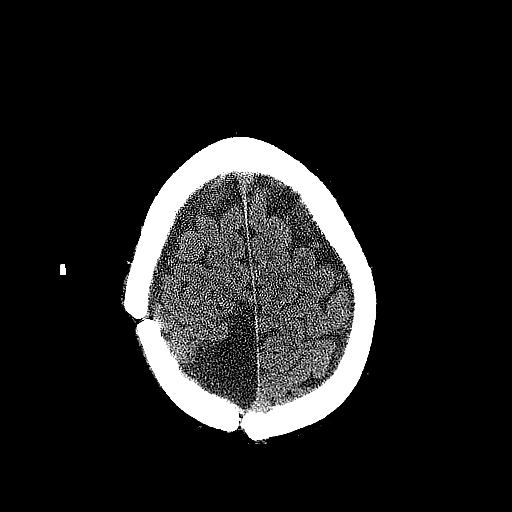
[im 48/64  brain]
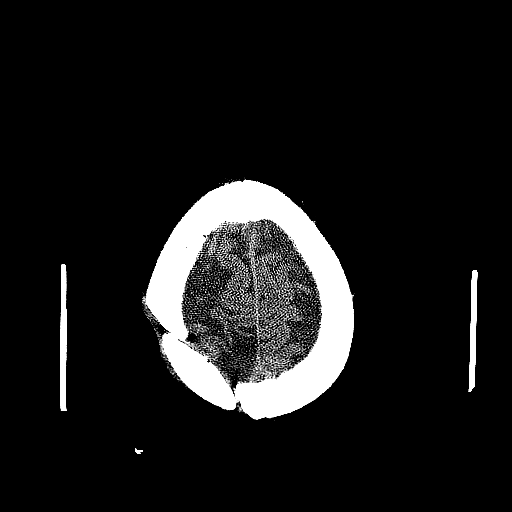
[im 48/64  bone]
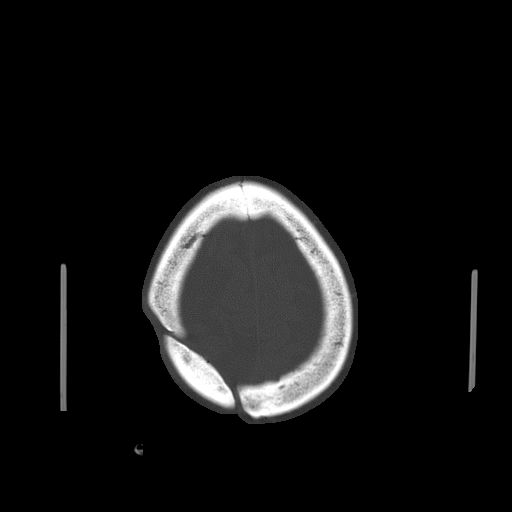
[im 53/64  brain]
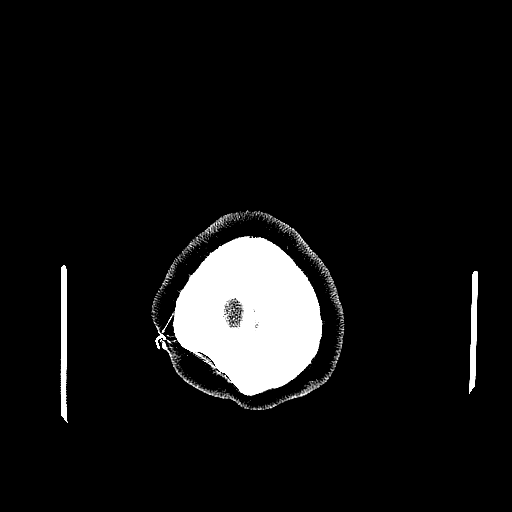

[15 of 30 positions shown; findings below may reference images not displayed]

FINDINGS: The previously demonstrated small amount of hemorrhage
layering in the posterior aspect of an encephalomalacia cavity in
the right occipital lobe is slightly better defined without an
increase in amount.  A previously demonstrated tiny amount of
hemorrhage layering in the occipital horn of the left lateral
ventricle is no longer seen.  No new hemorrhage is demonstrated.
Stable right parieto-occipital craniotomy changes.  The lateral
ventricles remain mildly enlarged, without significant change.
IMPRESSION: 1.  No acute abnormality.
2.  Resolved intraventricular blood on the left and slightly better
defined small amount of intraventricular blood on the right in the
encephalomalacia cavity with no new areas of hemorrhage seen.
3.  Stable mild enlargement of the lateral ventricles.

## 2014-04-13 IMAGING — CR DG CHEST 1V PORT
1 series · 1 of 1 positions shown · non-contrast
Comparison: 02/27/2012; 08/16/2010; 05/28/2010

CLINICAL DATA: Right-sided chest pain and cough

PORTABLE CHEST - 1 VIEW

[AP]
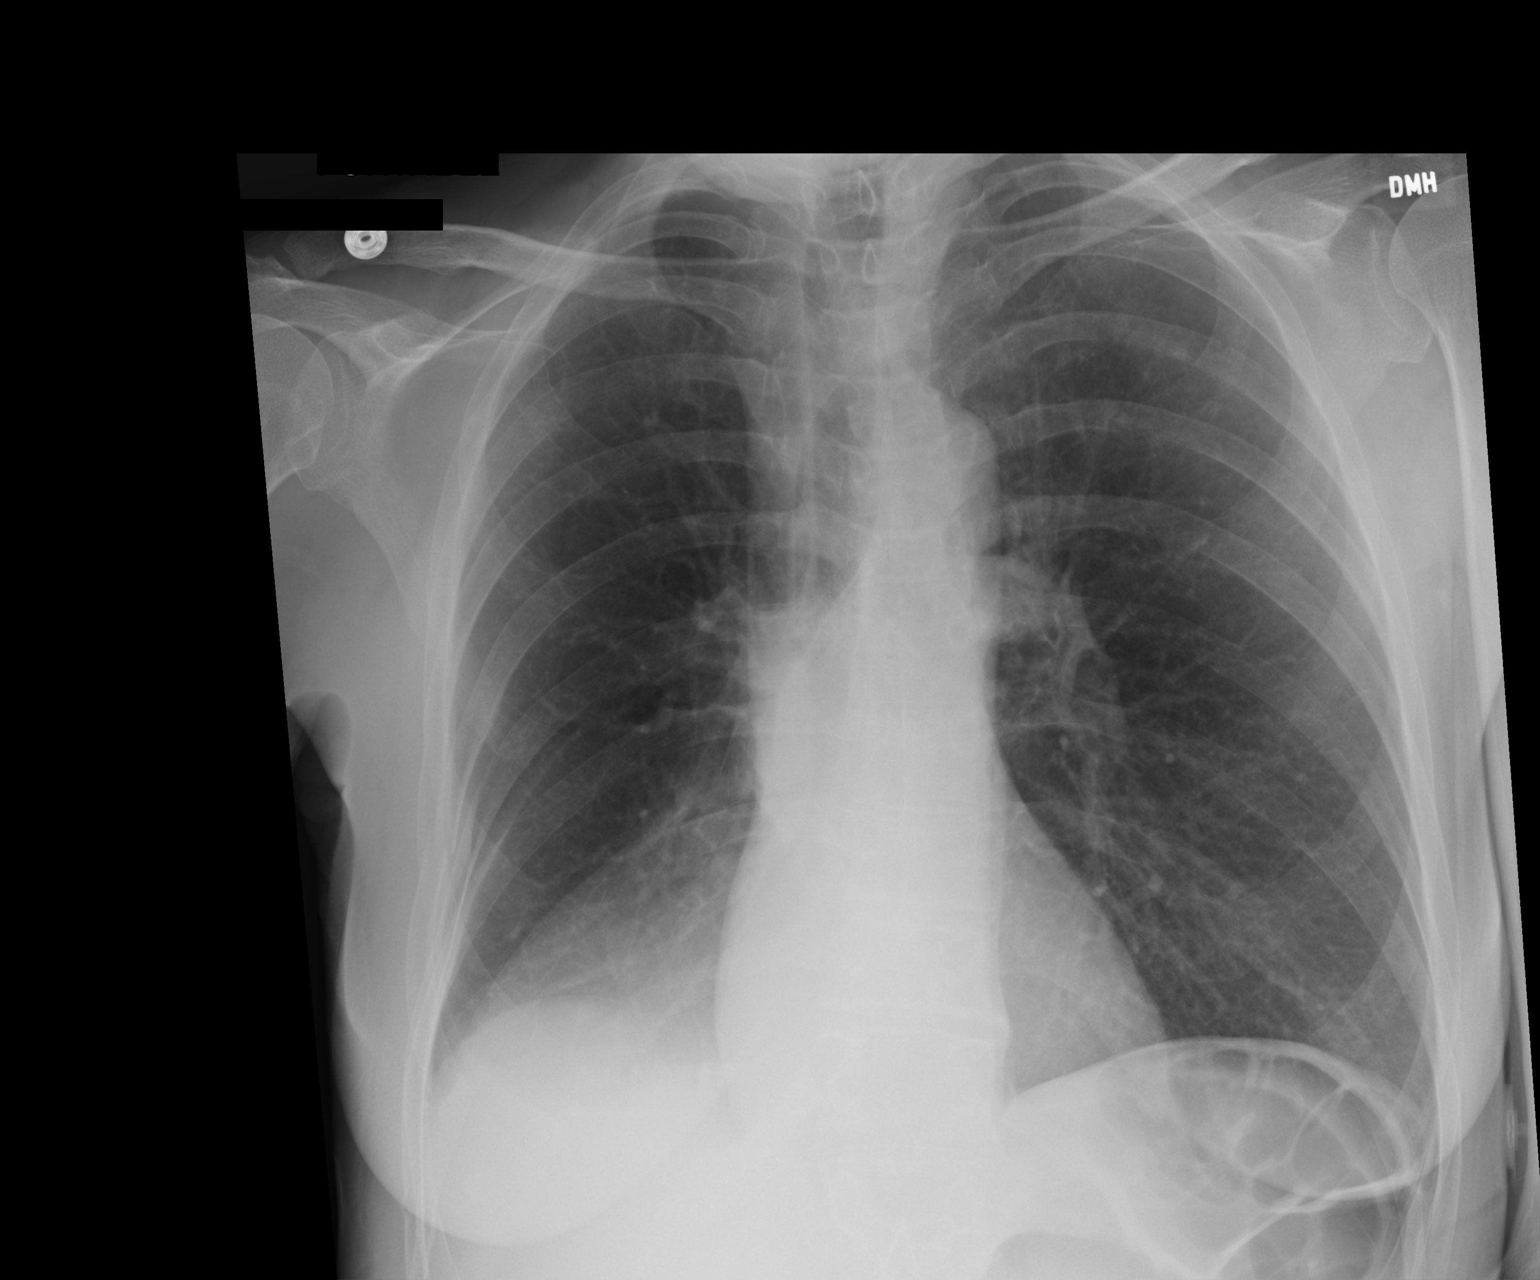

[1 of 1 positions shown; findings below may reference images not displayed]

FINDINGS: Grossly unchanged cardiac silhouette and mediastinal contours given
patient rotation.  The lungs appear hyperinflated.  Minimal right
basilar heterogeneous opacities, likely atelectasis.  No focal
airspace opacity.  No definite pleural effusion or pneumothorax.
There is a minimally displaced fracture of the lateral aspect of
the right seventh rib.
IMPRESSION: 1. Minimal displaced fracture of the lateral aspect of the right
seventh rib.
2. No acute cardiopulmonary disease.

## 2014-04-19 ENCOUNTER — Other Ambulatory Visit: Payer: Self-pay | Admitting: Neurology

## 2014-04-19 NOTE — Telephone Encounter (Signed)
Patient was dismissed from our practice

## 2014-05-14 ENCOUNTER — Encounter (HOSPITAL_COMMUNITY): Payer: Self-pay | Admitting: Emergency Medicine

## 2014-05-14 ENCOUNTER — Inpatient Hospital Stay (HOSPITAL_COMMUNITY)
Admission: EM | Admit: 2014-05-14 | Discharge: 2014-05-15 | DRG: 101 | Disposition: A | Discharge status: Home / Self Care | Payer: Medicare HMO | Attending: Internal Medicine | Admitting: Internal Medicine

## 2014-05-14 ENCOUNTER — Emergency Department (HOSPITAL_COMMUNITY): Payer: Medicare HMO

## 2014-05-14 DIAGNOSIS — G252 Other specified forms of tremor: Secondary | ICD-10-CM | POA: Diagnosis present

## 2014-05-14 DIAGNOSIS — D696 Thrombocytopenia, unspecified: Secondary | ICD-10-CM

## 2014-05-14 DIAGNOSIS — J4520 Mild intermittent asthma, uncomplicated: Secondary | ICD-10-CM

## 2014-05-14 DIAGNOSIS — M549 Dorsalgia, unspecified: Secondary | ICD-10-CM | POA: Diagnosis present

## 2014-05-14 DIAGNOSIS — G40909 Epilepsy, unspecified, not intractable, without status epilepticus: Secondary | ICD-10-CM | POA: Diagnosis not present

## 2014-05-14 DIAGNOSIS — R569 Unspecified convulsions: Secondary | ICD-10-CM

## 2014-05-14 DIAGNOSIS — Z888 Allergy status to other drugs, medicaments and biological substances status: Secondary | ICD-10-CM

## 2014-05-14 DIAGNOSIS — F17210 Nicotine dependence, cigarettes, uncomplicated: Secondary | ICD-10-CM | POA: Diagnosis present

## 2014-05-14 DIAGNOSIS — E46 Unspecified protein-calorie malnutrition: Secondary | ICD-10-CM | POA: Diagnosis present

## 2014-05-14 DIAGNOSIS — J45909 Unspecified asthma, uncomplicated: Secondary | ICD-10-CM | POA: Diagnosis present

## 2014-05-14 DIAGNOSIS — Z681 Body mass index (BMI) 19 or less, adult: Secondary | ICD-10-CM

## 2014-05-14 DIAGNOSIS — Z86718 Personal history of other venous thrombosis and embolism: Secondary | ICD-10-CM

## 2014-05-14 DIAGNOSIS — D709 Neutropenia, unspecified: Secondary | ICD-10-CM | POA: Diagnosis present

## 2014-05-14 DIAGNOSIS — G8929 Other chronic pain: Secondary | ICD-10-CM | POA: Diagnosis present

## 2014-05-14 DIAGNOSIS — M5136 Other intervertebral disc degeneration, lumbar region: Secondary | ICD-10-CM | POA: Diagnosis present

## 2014-05-14 DIAGNOSIS — Z79899 Other long term (current) drug therapy: Secondary | ICD-10-CM

## 2014-05-14 DIAGNOSIS — Z885 Allergy status to narcotic agent status: Secondary | ICD-10-CM

## 2014-05-14 LAB — URINALYSIS, ROUTINE W REFLEX MICROSCOPIC
Bilirubin Urine: NEGATIVE
Glucose, UA: NEGATIVE mg/dL
HGB URINE DIPSTICK: NEGATIVE
KETONES UR: NEGATIVE mg/dL
Leukocytes, UA: NEGATIVE
Nitrite: NEGATIVE
PH: 6 (ref 5.0–8.0)
PROTEIN: NEGATIVE mg/dL
Specific Gravity, Urine: 1.013 (ref 1.005–1.030)
Urobilinogen, UA: 0.2 mg/dL (ref 0.0–1.0)

## 2014-05-14 LAB — BASIC METABOLIC PANEL
Anion gap: 14 (ref 5–15)
BUN: 6 mg/dL (ref 6–23)
CALCIUM: 9.2 mg/dL (ref 8.4–10.5)
CO2: 25 meq/L (ref 19–32)
Chloride: 102 mEq/L (ref 96–112)
Creatinine, Ser: 0.64 mg/dL (ref 0.50–1.10)
GFR calc Af Amer: >90 mL/min (ref >90)
GLUCOSE: 99 mg/dL (ref 70–99)
Potassium: 4.1 mEq/L (ref 3.7–5.3)
Sodium: 141 mEq/L (ref 137–147)

## 2014-05-14 LAB — CBC
HEMATOCRIT: 34.5 % — AB (ref 36.0–46.0)
HEMOGLOBIN: 11.8 g/dL — AB (ref 12.0–15.0)
MCH: 34.3 pg — ABNORMAL HIGH (ref 26.0–34.0)
MCHC: 34.2 g/dL (ref 30.0–36.0)
MCV: 100.3 fL — ABNORMAL HIGH (ref 78.0–100.0)
Platelets: <5 10*3/uL — CL (ref 150–400)
RBC: 3.44 MIL/uL — ABNORMAL LOW (ref 3.87–5.11)
RDW: 12.5 % (ref 11.5–15.5)
WBC: 0.5 10*3/uL — CL (ref 4.0–10.5)

## 2014-05-14 LAB — CBC WITH DIFFERENTIAL/PLATELET
BASOS ABS: 0 10*3/uL (ref 0.0–0.1)
Basophils Relative: 0 % (ref 0–1)
Eosinophils Absolute: 0.1 10*3/uL (ref 0.0–0.7)
Eosinophils Relative: 2 % (ref 0–5)
HEMATOCRIT: 39.2 % (ref 36.0–46.0)
HEMOGLOBIN: 13.2 g/dL (ref 12.0–15.0)
LYMPHS ABS: 2.9 10*3/uL (ref 0.7–4.0)
LYMPHS PCT: 50 % — AB (ref 12–46)
MCH: 34.5 pg — ABNORMAL HIGH (ref 26.0–34.0)
MCHC: 33.7 g/dL (ref 30.0–36.0)
MCV: 102.3 fL — AB (ref 78.0–100.0)
MONOS PCT: 6 % (ref 3–12)
Monocytes Absolute: 0.3 10*3/uL (ref 0.1–1.0)
NEUTROS ABS: 2.4 10*3/uL (ref 1.7–7.7)
Neutrophils Relative %: 43 % (ref 43–77)
Platelets: 155 10*3/uL (ref 150–400)
RBC: 3.83 MIL/uL — ABNORMAL LOW (ref 3.87–5.11)
RDW: 12.6 % (ref 11.5–15.5)
WBC: 5.7 10*3/uL (ref 4.0–10.5)

## 2014-05-14 LAB — PHENYTOIN LEVEL, TOTAL: Phenytoin Lvl: 12.2 ug/mL (ref 10.0–20.0)

## 2014-05-14 NOTE — ED Notes (Signed)
Pt having seizure, MD notified and in room

## 2014-05-14 NOTE — ED Notes (Signed)
Pt was visiting someone upstairs and had a witnessed seizure by staff. She had a brain surgery several years ago and as a result has seizures. Takes dilantin. Denies any pain, incontinence, or injury to tongue. Pt is alert and oriented at this time.

## 2014-05-14 NOTE — ED Notes (Signed)
Lab called with WBC 0.5 and platelet 2.  Salome Arnt MD and advised results.  Order for

## 2014-05-14 NOTE — ED Provider Notes (Signed)
CSN: [id number]     Arrival date & time 05/14/14  1858 History   First MD Initiated Contact with Patient 05/14/14 2015     Chief Complaint  Patient presents with  . Seizures   Patient is a 58 y.o. female presenting with seizures. The history is provided by the patient.  Seizures Seizure activity on arrival: no   Initial focality:  Diffuse Episode characteristics: abnormal movements and unresponsiveness   Return to baseline: yes   Duration: unknown. Timing:  Once Number of seizures this episode:  1 Progression:  Resolved PTA treatment:  None History of seizures: yes    58 year old Caucasian female with history of seizure disorder who presents after a seizure. 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 Patient is visiting her mom who is a hospital for heart disease. Patient states that she had a witnessed seizure for a long period time. She thinks she bit her lip but did not lose control of her bowel or bladder. Patient states she had a similar episode several months back when her brother-in-law died unexpectedly. Patient states that she occasionally has seizures when she is under stress. Patient denies headache, weakness, chest pain, shortness of breath. Patient is compliant with her Dilantin and Vimpat. Patient states she has not had much sleep over the last couple of days. She has been eating and drinking normally.   Past Medical History  Diagnosis Date  . Seizures   . DVT (deep venous thrombosis)   . Asthma   . Cerebral hemorrhage   . Chronic back pain   . DDD (degenerative disc disease) 06/12/2013   Past Surgical History  Procedure Laterality Date  . Cesarean section    . Brain surgery      craniotomy with hematoma evacuation   Family History  Problem Relation Age of Onset  . Heart attack Mother   . Heart attack Father    History  Substance Use Topics  . Smoking status: Current Every Day Smoker -- 0.50 packs/day for 20  years    Types: Cigarettes  . Smokeless tobacco: Never Used  . Alcohol Use: No   OB History   Grav Para Term Preterm Abortions TAB SAB Ect Mult Living                 Review of Systems  Constitutional: Negative for fever.  HENT: Negative for rhinorrhea and sore throat.   Eyes: Negative for visual disturbance.  Respiratory: Negative for chest tightness and shortness of breath.   Cardiovascular: Negative for chest pain and palpitations.  Gastrointestinal: Negative for nausea, vomiting, abdominal pain and constipation.  Genitourinary: Negative for dysuria and hematuria.  Musculoskeletal: Negative for back pain and neck pain.  Skin: Negative for rash.  Neurological: Positive for seizures. Negative for dizziness and headaches.  Psychiatric/Behavioral: Negative for confusion.  All other systems reviewed and are negative.  Allergies  Codeine; Keppra; and Olive oil  Home Medications   Prior to Admission medications   Medication Sig Start Date End Date Taking? Authorizing Provider  acetaminophen (TYLENOL) 500 MG tablet Take 500 mg by mouth 2 (two) times daily as needed (pain).   Yes Historical Provider, MD  lacosamide (VIMPAT) 200 MG TABS tablet Take 200 mg by mouth 2 (two) times daily.   Yes Historical Provider, MD  LORazepam (ATIVAN) 0.5 MG tablet Take 0.5 mg by mouth every 8 (eight) hours as needed for anxiety.  04/23/14  Yes Historical Provider,  MD  phenytoin (DILANTIN) 100 MG ER capsule Take 100-200 mg by mouth 2 (two) times daily. Take 1 capsule (100 mg) every morning and 2 capsules (200 mg) every night   Yes Historical Provider, MD   BP 121/94  Pulse 92  Temp(Src) 98.7 F (37.1 C) (Oral)  Resp 20  Ht 5\' 7"  (1.702 m)  Wt 122 lb (55.339 kg)  BMI 19.10 kg/m2  SpO2 96% Physical Exam  Constitutional: She is oriented to person, place, and time. She appears well-developed and well-nourished. No distress.  HENT:  Head: Normocephalic and atraumatic.  Mouth/Throat: Oropharynx is  clear and moist.  Eyes: EOM are normal. Pupils are equal, round, and reactive to light.  Neck: Neck supple. No JVD present.  Cardiovascular: Normal rate, regular rhythm, normal heart sounds and intact distal pulses.  Exam reveals no gallop.   No murmur heard. Pulmonary/Chest: Effort normal and breath sounds normal. She has no wheezes. She has no rales.  Abdominal: Soft. She exhibits no distension. There is no tenderness.  Musculoskeletal: Normal range of motion. She exhibits no tenderness.  Neurological: She is alert and oriented to person, place, and time. She displays no tremor. No cranial nerve deficit or sensory deficit. She exhibits normal muscle tone. She displays no seizure activity. Coordination and gait normal. She displays no Babinski's sign on the right side. She displays no Babinski's sign on the left side.  Reflex Scores:      Tricep reflexes are 2+ on the right side and 2+ on the left side.      Bicep reflexes are 2+ on the right side and 2+ on the left side.      Brachioradialis reflexes are 2+ on the right side and 2+ on the left side.      Patellar reflexes are 2+ on the right side and 2+ on the left side.      Achilles reflexes are 2+ on the right side and 2+ on the left side. Skin: Skin is warm and dry. No rash noted.  Psychiatric: Her behavior is normal.    ED Course  Procedures None  Labs Review Labs Reviewed  CBC - Abnormal; Notable for the following:    WBC 0.5 (*)    RBC 3.44 (*)    Hemoglobin 11.8 (*)    HCT 34.5 (*)    MCV 100.3 (*)    MCH 34.3 (*)    Platelets <5 (*)    All other components within normal limits  CBC WITH DIFFERENTIAL - Abnormal; Notable for the following:    RBC 3.83 (*)    MCV 102.3 (*)    MCH 34.5 (*)    Lymphocytes Relative 50 (*)    All other components within normal limits  PHENYTOIN LEVEL, TOTAL  BASIC METABOLIC PANEL  URINALYSIS, ROUTINE W REFLEX MICROSCOPIC    Imaging Review Ct Head Wo Contrast  05/14/2014   CLINICAL  DATA:  Seizures.  Initial encounter.  EXAM: CT HEAD WITHOUT CONTRAST  TECHNIQUE: Contiguous axial images were obtained from the base of the skull through the vertex without intravenous contrast.  COMPARISON:  06/11/2013  FINDINGS: Skull and Sinuses:Unchanged appearance of a remote right parietal craniotomy, with the bone flap chronically, mildly depressed along its anterior margin. No acute bony findings.  Orbits: No acute abnormality.  Brain: Extensive encephalomalacia and gliosis centered in the right occipital and parietal regions is unchanged in appearance and extent. There is no neighboring edema or hemorrhage. No evidence of acute infarct, hydrocephalus, mass  lesion or shift. Stable mild cerebral volume loss which is generalized.  IMPRESSION: 1. No acute intracranial findings. 2. Stable right parietal-occipital encephalomalacia.   Electronically Signed   By: Jorje Guild M.D.   On: 05/14/2014 23:05    MDM   Final diagnoses:  Seizure   58 year old female with history of seizures presents after a seizure event while visiting her mom in the hospital. Patient is not exhibiting any seizure-like activity at this time. She is neurologically intact. Will check labs and Dilantin level. Do not feel imaging is warranted at this time. I was contacted by the laboratory that the patient was neutropenic and thrombocytopenic. Will repeat this study. Patient also had a seizure in her room. She is post ictal. She is slow to answer questions. Will obtain CT head and admit. CT head negative. Repeat CBC within normal limits. Neurology consult and will evaluate the patient for further management. Other seizure activity while in the ED. patient stable for transport to the floor.  Case discussed with Dr. Jeanell Sparrow.   Gustavus Bryant, MD 05/15/14 [phone]

## 2014-05-15 DIAGNOSIS — D696 Thrombocytopenia, unspecified: Secondary | ICD-10-CM

## 2014-05-15 DIAGNOSIS — Z681 Body mass index (BMI) 19 or less, adult: Secondary | ICD-10-CM | POA: Diagnosis not present

## 2014-05-15 DIAGNOSIS — G40909 Epilepsy, unspecified, not intractable, without status epilepticus: Secondary | ICD-10-CM | POA: Diagnosis present

## 2014-05-15 DIAGNOSIS — G252 Other specified forms of tremor: Secondary | ICD-10-CM | POA: Diagnosis not present

## 2014-05-15 DIAGNOSIS — F17210 Nicotine dependence, cigarettes, uncomplicated: Secondary | ICD-10-CM | POA: Diagnosis not present

## 2014-05-15 DIAGNOSIS — Z888 Allergy status to other drugs, medicaments and biological substances status: Secondary | ICD-10-CM | POA: Diagnosis not present

## 2014-05-15 DIAGNOSIS — R569 Unspecified convulsions: Secondary | ICD-10-CM | POA: Diagnosis present

## 2014-05-15 DIAGNOSIS — M5136 Other intervertebral disc degeneration, lumbar region: Secondary | ICD-10-CM | POA: Diagnosis not present

## 2014-05-15 DIAGNOSIS — M549 Dorsalgia, unspecified: Secondary | ICD-10-CM | POA: Diagnosis not present

## 2014-05-15 DIAGNOSIS — J45909 Unspecified asthma, uncomplicated: Secondary | ICD-10-CM | POA: Diagnosis not present

## 2014-05-15 DIAGNOSIS — D709 Neutropenia, unspecified: Secondary | ICD-10-CM | POA: Diagnosis present

## 2014-05-15 DIAGNOSIS — Z885 Allergy status to narcotic agent status: Secondary | ICD-10-CM | POA: Diagnosis not present

## 2014-05-15 DIAGNOSIS — J4520 Mild intermittent asthma, uncomplicated: Secondary | ICD-10-CM

## 2014-05-15 DIAGNOSIS — E46 Unspecified protein-calorie malnutrition: Secondary | ICD-10-CM | POA: Diagnosis present

## 2014-05-15 DIAGNOSIS — Z86718 Personal history of other venous thrombosis and embolism: Secondary | ICD-10-CM | POA: Diagnosis not present

## 2014-05-15 DIAGNOSIS — Z79899 Other long term (current) drug therapy: Secondary | ICD-10-CM | POA: Diagnosis not present

## 2014-05-15 DIAGNOSIS — G8929 Other chronic pain: Secondary | ICD-10-CM | POA: Diagnosis not present

## 2014-05-15 DIAGNOSIS — G4089 Other seizures: Secondary | ICD-10-CM

## 2014-05-15 LAB — BASIC METABOLIC PANEL
Anion gap: 11 (ref 5–15)
BUN: 8 mg/dL (ref 6–23)
CALCIUM: 8.6 mg/dL (ref 8.4–10.5)
CHLORIDE: 107 meq/L (ref 96–112)
CO2: 24 meq/L (ref 19–32)
Creatinine, Ser: 0.65 mg/dL (ref 0.50–1.10)
GFR calc Af Amer: >90 mL/min (ref >90)
GFR calc non Af Amer: >90 mL/min (ref >90)
GLUCOSE: 93 mg/dL (ref 70–99)
Potassium: 4.2 mEq/L (ref 3.7–5.3)
SODIUM: 142 meq/L (ref 137–147)

## 2014-05-15 LAB — CBC
HCT: 39.1 % (ref 36.0–46.0)
HEMOGLOBIN: 13.1 g/dL (ref 12.0–15.0)
MCH: 34.4 pg — AB (ref 26.0–34.0)
MCHC: 33.5 g/dL (ref 30.0–36.0)
MCV: 102.6 fL — ABNORMAL HIGH (ref 78.0–100.0)
PLATELETS: 151 10*3/uL (ref 150–400)
RBC: 3.81 MIL/uL — AB (ref 3.87–5.11)
RDW: 12.8 % (ref 11.5–15.5)
WBC: 5 10*3/uL (ref 4.0–10.5)

## 2014-05-15 MED ORDER — SODIUM CHLORIDE 0.9 % IV SOLN
INTRAVENOUS | Status: DC
  Administered 2014-05-15: 1000 mL via INTRAVENOUS

## 2014-05-15 MED ORDER — INFLUENZA VAC SPLIT QUAD 0.5 ML IM SUSY
0.5000 mL | PREFILLED_SYRINGE | INTRAMUSCULAR | Status: AC
  Administered 2014-05-15: 0.5 mL via INTRAMUSCULAR
  Filled 2014-05-15: qty 0.5

## 2014-05-15 MED ORDER — LORAZEPAM 2 MG/ML IJ SOLN: 2.0000 mg | INTRAMUSCULAR | Status: DC | PRN

## 2014-05-15 MED ORDER — HEPARIN SODIUM (PORCINE) 5000 UNIT/ML IJ SOLN
5000.0000 [IU] | Freq: Three times a day (TID) | INTRAMUSCULAR | Status: DC
  Administered 2014-05-15: 5000 [IU] via SUBCUTANEOUS
  Filled 2014-05-15: qty 1

## 2014-05-15 MED ORDER — ACETAMINOPHEN 500 MG PO TABS
500.0000 mg | ORAL_TABLET | Freq: Two times a day (BID) | ORAL | Status: DC | PRN
  Administered 2014-05-15: 500 mg via ORAL
  Filled 2014-05-15: qty 1

## 2014-05-15 MED ORDER — SODIUM CHLORIDE 0.9 % IV SOLN: INTRAVENOUS | Status: DC

## 2014-05-15 MED ORDER — LACOSAMIDE 50 MG PO TABS
200.0000 mg | ORAL_TABLET | Freq: Two times a day (BID) | ORAL | Status: DC
  Administered 2014-05-15: 200 mg via ORAL
  Filled 2014-05-15: qty 4

## 2014-05-15 MED ORDER — PHENYTOIN SODIUM EXTENDED 100 MG PO CAPS
100.0000 mg | ORAL_CAPSULE | Freq: Every day | ORAL | Status: DC
  Administered 2014-05-15: 100 mg via ORAL
  Filled 2014-05-15: qty 1

## 2014-05-15 MED ORDER — PHENYTOIN SODIUM EXTENDED 100 MG PO CAPS: 200.0000 mg | ORAL_CAPSULE | Freq: Every day | ORAL | Status: DC

## 2014-05-15 NOTE — Discharge Instructions (Signed)
Seizure, Adult A seizure means there is unusual activity in the brain. A seizure can cause changes in attention or behavior. Seizures often cause shaking (convulsions). Seizures often last from 30 seconds to 2 minutes. HOME CARE   If you are given medicines, take them exactly as told by your doctor.  Keep all doctor visits as told.  Do not swim or drive until your doctor says it is okay.  Teach others what to do if you have a seizure. They should:  Lay you on the ground.  Put a cushion under your head.  Loosen any tight clothing around your neck.  Turn you on your side.  Stay with you until you get better. GET HELP RIGHT AWAY IF:   The seizure lasts longer than 2 to 5 minutes.  The seizure is very bad.  The person does not wake up after the seizure.  The person's attention or behavior changes. Drive the person to the emergency room or call your local emergency services (911 in [place] MAKE SURE YOU:   Understand these instructions.  Will watch your condition.  Will get help right away if you are not doing well or get worse. Document Released: 01/07/2008 Document Revised: 10/13/2011 Document Reviewed: 07/09/2011 Exit[employer] Patient Information 2015 [employer]. This information is not intended to replace advice given to you by your health care provider. Make sure you discuss any questions you have with your health care provider.  

## 2014-05-15 NOTE — Progress Notes (Signed)
D/C orders received. Pt educated on d/c instructions and seizure precautions. Verbalized understanding. Pt handed d/c packet. IV and tele removed. Pt taken downstairs by staff via wheelchair.

## 2014-05-15 NOTE — Progress Notes (Signed)
Admitting MD paged about patients NPO diet order.Given permission to perform swallow screen and advance diet. Ileene Rubens Emslee Lopezmartinez,RN

## 2014-05-15 NOTE — H&P (Signed)
[facility] History and Physical  [name] DDU:[id number] DOB: 1956-05-09 DOA: 05/14/2014  Referring physician: ED physician PCP: [name], MD  Specialists:   Chief Complaint: seizure  HPI: [name] is a 58 y.o. female with PMH of seizure, ICH, s/p of  "brain aneurysm" repair, asthma, remote hx of bilateral leg DVT, DDD, who presents with seizure.   Patient states she had ICH and "brain aneurysm" repair in 1988. After that she has been having seizure. She has been compliant with her Dilantin and Vimpat. She reports that her seizure usually happens when she has stress. The last seizure episode was 2 weeks ago when her brother-in-law died. Today, she is visiting her mom who is in hospital for heart attack. She had a witnessed seizure for a long period time. She thinks she bit her lip but did not lose control of her bowel or bladder. She is taken to the ED. when I evaluated the patient in ED, she looks normal. She does not have fever, chills, shortness of breath, cough, chest pain, nausea, vomiting, diarrhea.   Her head CT has no acute intracranial abnormalities. Her Dilantin level is therapeutic. She is admitted to inpatient for further evaluation the treatment. Neurology was consulted.  Review of Systems: As presented in the history of presenting illness, rest negative.  Where does patient live? Lives with her son's family   Can patient participate in ADLs? Yes  Allergy:  Allergies  Allergen Reactions  . Codeine Nausea And Vomiting  . Keppra [Levetiracetam] Other (See Comments)    Causes seizures.   [name] Oil Rash    Past Medical History  Diagnosis Date  . Seizures   . DVT (deep venous thrombosis)   . Asthma   . Cerebral hemorrhage   . Chronic back pain   . DDD (degenerative disc disease) 06/12/2013    Past Surgical History  Procedure Laterality Date  . Cesarean section    . Brain surgery      craniotomy with hematoma evacuation    Social History:   reports that she has been smoking Cigarettes.  She has a 10 pack-year smoking history. She has never used smokeless tobacco. She reports that she does not drink alcohol or use illicit drugs.  Family History:  Family History  Problem Relation Age of Onset  . Heart attack Mother   . Heart attack Father      Prior to Admission medications   Medication Sig Start Date End Date Taking? Authorizing Provider  acetaminophen (TYLENOL) 500 MG tablet Take 500 mg by mouth 2 (two) times daily as needed (pain).   Yes Historical Provider, MD  lacosamide (VIMPAT) 200 MG TABS tablet Take 200 mg by mouth 2 (two) times daily.   Yes Historical Provider, MD  LORazepam (ATIVAN) 0.5 MG tablet Take 0.5 mg by mouth every 8 (eight) hours as needed for anxiety.  04/23/14  Yes Historical Provider, MD  phenytoin (DILANTIN) 100 MG ER capsule Take 100-200 mg by mouth 2 (two) times daily. Take 1 capsule (100 mg) every morning and 2 capsules (200 mg) every night   Yes Historical Provider, MD    Physical Exam: Filed Vitals:   05/15/14 0000 05/15/14 0001 05/15/14 0123 05/15/14 0213  BP: 97/43 97/43 101/58 99/60  Pulse: 72 75 77 79  Temp:  98.6 F (37 C) 98.3 F (36.8 C) 98.2 F (36.8 C)  TempSrc:  Oral Oral Oral  Resp: 17 20 16 16   Height:  Weight:      SpO2: 97% 97% 99% 96%   General: Not in acute distress HEENT:       Eyes: PERRL, EOMI, no scleral icterus       ENT: No discharge from the ears and nose, no pharynx injection, no tonsillar enlargement.        Neck: No JVD, no bruit, no mass felt. Cardiac: S1/S2, RRR, No murmurs, gallops or rubs Pulm: Good air movement bilaterally. Clear to auscultation bilaterally. No rales, wheezing, rhonchi or rubs. Abd: Soft, nondistended, nontender, no rebound pain, no organomegaly, BS present Ext: No edema. 2+DP/PT pulse bilaterally Musculoskeletal: No joint deformities, erythema, or stiffness, ROM full Skin: No rashes.  Neuro: Alert and oriented X3, cranial nerves  II-XII grossly intact, muscle strength 5/5 in all extremeties, sensation to light touch intact. Brachial reflex 2+ bilaterally. Knee reflex 1+ bilaterally. Negative Babinski's sign. Normal finger to nose test. Psych: Patient is not psychotic, no suicidal or hemocidal ideation.  Labs on Admission:  Basic Metabolic Panel:  Recent Labs Lab 05/14/14 2050  NA 141  K 4.1  CL 102  CO2 25  GLUCOSE 99  BUN 6  CREATININE 0.64  CALCIUM 9.2   Liver Function Tests: No results found for this basename: AST, ALT, ALKPHOS, BILITOT, PROT, ALBUMIN,  in the last 168 hours No results found for this basename: LIPASE, AMYLASE,  in the last 168 hours No results found for this basename: AMMONIA,  in the last 168 hours CBC:  Recent Labs Lab 05/14/14 2050 05/14/14 2229  WBC 0.5* 5.7  NEUTROABS  --  2.4  HGB 11.8* 13.2  HCT 34.5* 39.2  MCV 100.3* 102.3*  PLT <5* 155   Cardiac Enzymes: No results found for this basename: CKTOTAL, CKMB, CKMBINDEX, TROPONINI,  in the last 168 hours  BNP (last 3 results) No results found for this basename: PROBNP,  in the last 8760 hours CBG: No results found for this basename: GLUCAP,  in the last 168 hours  Radiological Exams on Admission: Ct Head Wo Contrast  05/14/2014   CLINICAL DATA:  Seizures.  Initial encounter.  EXAM: CT HEAD WITHOUT CONTRAST  TECHNIQUE: Contiguous axial images were obtained from the base of the skull through the vertex without intravenous contrast.  COMPARISON:  06/11/2013  FINDINGS: Skull and Sinuses:Unchanged appearance of a remote right parietal craniotomy, with the bone flap chronically, mildly depressed along its anterior margin. No acute bony findings.  Orbits: No acute abnormality.  Brain: Extensive encephalomalacia and gliosis centered in the right occipital and parietal regions is unchanged in appearance and extent. There is no neighboring edema or hemorrhage. No evidence of acute infarct, hydrocephalus, mass lesion or shift. Stable  mild cerebral volume loss which is generalized.  IMPRESSION: 1. No acute intracranial findings. 2. Stable right parietal-occipital encephalomalacia.   Electronically Signed   By: Jorje Guild M.D.   On: 05/14/2014 23:05    EKG: Independently reviewed.   Assessment/Plan Active Problems:   Seizure disorder   Asthma   Seizure  # Seizure: recurrent seizures is likely due to stress. CT-head is negative for acute intracranial abnormalities. She has been compliant to her medications. Her Dilantin level is therapeutic on admission. Neurology was consulted.  - Admitted to MedSurg bed - Continue Dilantin and Vimpat - Ativan prn - follow up neurology's  recs -q4h neuro check  # asthma: not taking med. Stable. Lung auscultation is clear bilaterally - observe closely  DVT ppx: SQ Heparin    Code Status:  Full code Family Communication: None at bed side.  Disposition Plan: Admit to inpatient   Date of Service 05/15/2014    Ivor Costa [facility] Pager [phone]  If 7PM-7AM, please contact night-coverage [email or web address] Password [email or web address] 05/15/2014, 2:40 AM

## 2014-05-15 NOTE — Discharge Summary (Signed)
Physician Discharge Summary  [name]:[id number] DOB: Feb 28, 1956 DOA: 05/14/2014  PCP: [name], MD  Primary neurologist : [name] , MD at [facility] date: 05/14/2014 Discharge date: 05/15/2014  Time spent: 25 minutes  Recommendations for Outpatient Follow-up:  1. D/c hoime with outpt PCP and neurology follow up  Discharge Diagnoses:  Seizures  Active Problems:   Asthma   Protein calorie malnutrition   Discharge Condition: fair  Diet recommendation: regular  Filed Weights   05/14/14 1906  Weight: 55.339 kg (122 lb)    History of present illness:  58 y.o. female with PMH of seizure, ICH, s/p of "brain aneurysm" repair, asthma, remote hx of bilateral leg DVT, DDD, who presents with seizure.  Patient informs she had ICH and "brain aneurysm" repair in 1988 after which she has been having seizure. She has been compliant with her Dilantin and Vimpat. She reports that her seizure usually happens when she has stress. The last seizure episode was 2 weeks ago when her brother-in-law died. On the day of admission,  she is visiting her mom who is in hospital for heart attack. She had a witnessed seizure for a long period time. She thinks she bit her lip but did not lose control of her bowel or bladder. She was  taken to the ED. She denied not have fever, chills, shortness of breath, cough, chest pain, nausea, vomiting, diarrhea.  Her head CT has no acute intracranial abnormalities and showed chronic encephalomalacia. Her Dilantin level is therapeutic. She was admitted under observation for further evaluation the treatment.   Hospital Course:  Seizure:  recurrent seizures is likely due to stress. CT-head is negative for acute intracranial abnormalities. She has been compliant to her medications. Her Dilantin level is therapeutic on admission. Neurology was consulted and recommended overnight observation.  patient stable and has no further seizure activity. No  neurological deficit on exam. Encouraged to continue taking her meds. She does not drive any vehicle. Spoke with neurology this am who agreed with her discharge.  she is advised to follow up with her neurologist at [place] in 1-2 weeks. - Continue Dilantin and Vimpat    asthma:  Not on medications. Stable.  Malnutrition  follow up with PCP    Procedures:  CT head  Consultations:  neuro  Discharge Exam: Filed Vitals:   05/15/14 0952  BP: 101/54  Pulse: 73  Temp: 98.4 F (36.9 C)  Resp: 20    General: NAD HEENT: moist mucosa Chest: clear b/l  CVS: NS1&S2, no murmurs Abd: soft, NT, ND,  Ext: warm, no edema  CNS: alert and oriented, non focal   Discharge Instructions You were cared for by a hospitalist during your hospital stay. If you have any questions about your discharge medications or the care you received while you were in the hospital after you are discharged, you can call the unit and asked to speak with the hospitalist on call if the hospitalist that took care of you is not available. Once you are discharged, your primary care physician will handle any further medical issues. Please note that NO REFILLS for any discharge medications will be authorized once you are discharged, as it is imperative that you return to your primary care physician (or establish a relationship with a primary care physician if you do not have one) for your aftercare needs so that they can reassess your need for medications and monitor your lab values.   Current Discharge Medication List  CONTINUE these medications which have NOT CHANGED   Details  acetaminophen (TYLENOL) 500 MG tablet Take 500 mg by mouth 2 (two) times daily as needed (pain).    lacosamide (VIMPAT) 200 MG TABS tablet Take 200 mg by mouth 2 (two) times daily.    LORazepam (ATIVAN) 0.5 MG tablet Take 0.5 mg by mouth every 8 (eight) hours as needed for anxiety.     phenytoin (DILANTIN) 100 MG ER capsule Take 100-200 mg  by mouth 2 (two) times daily. Take 1 capsule (100 mg) every morning and 2 capsules (200 mg) every night       Allergies  Allergen Reactions  . Codeine Nausea And Vomiting  . Keppra [Levetiracetam] Other (See Comments)    Causes seizures.   [name] Oil Rash   Follow-up Information   Follow up with [name], MD. Schedule an appointment as soon as possible for a visit in 1 week.   Specialty:  Internal Medicine   Contact information:   [address] [place] [address] [phone]       Follow up with Assunta Found, Grace Isaac, MD. Schedule an appointment as soon as possible for a visit in 1 week.   Specialty:  Neurology   Contact information:   [place] [address] [phone]        The results of significant diagnostics from this hospitalization (including imaging, microbiology, ancillary and laboratory) are listed below for reference.    Significant Diagnostic Studies: Ct Head Wo Contrast  05/14/2014   CLINICAL DATA:  Seizures.  Initial encounter.  EXAM: CT HEAD WITHOUT CONTRAST  TECHNIQUE: Contiguous axial images were obtained from the base of the skull through the vertex without intravenous contrast.  COMPARISON:  06/11/2013  FINDINGS: Skull and Sinuses:Unchanged appearance of a remote right parietal craniotomy, with the bone flap chronically, mildly depressed along its anterior margin. No acute bony findings.  Orbits: No acute abnormality.  Brain: Extensive encephalomalacia and gliosis centered in the right occipital and parietal regions is unchanged in appearance and extent. There is no neighboring edema or hemorrhage. No evidence of acute infarct, hydrocephalus, mass lesion or shift. Stable mild cerebral volume loss which is generalized.  IMPRESSION: 1. No acute intracranial findings. 2. Stable right parietal-occipital encephalomalacia.   Electronically Signed   By: Jorje Guild M.D.   On: 05/14/2014 23:05     Microbiology: No results found for this or any previous visit (from the past 240 hour(s)).   Labs: Basic Metabolic Panel:  Recent Labs Lab 05/14/14 2050 05/15/14 0650  NA 141 142  K 4.1 4.2  CL 102 107  CO2 25 24  GLUCOSE 99 93  BUN 6 8  CREATININE 0.64 0.65  CALCIUM 9.2 8.6   Liver Function Tests: No results found for this basename: AST, ALT, ALKPHOS, BILITOT, PROT, ALBUMIN,  in the last 168 hours No results found for this basename: LIPASE, AMYLASE,  in the last 168 hours No results found for this basename: AMMONIA,  in the last 168 hours CBC:  Recent Labs Lab 05/14/14 2050 05/14/14 2229 05/15/14 0650  WBC 0.5* 5.7 5.0  NEUTROABS  --  2.4  --   HGB 11.8* 13.2 13.1  HCT 34.5* 39.2 39.1  MCV 100.3* 102.3* 102.6*  PLT <5* 155 151   Cardiac Enzymes: No results found for this basename: CKTOTAL, CKMB, CKMBINDEX, TROPONINI,  in the last 168 hours BNP: BNP (last 3 results) No results found for this basename: PROBNP,  in the last 8760 hours CBG: No results found for this basename: GLUCAP,  in the last 168 hours     Signed:  Louellen Molder  [facility] 05/15/2014, 11:31 AM

## 2014-05-15 NOTE — Progress Notes (Signed)
Speech Language Pathology Treatment:    Patient Details Name: [name] MRN: [id number] DOB: 27-Oct-1955 Today's Date: 05/15/2014 Time:  -     Assessment / Plan / Recommendation Clinical Impression  Orders placed prior to stroke swallow screen performed. Pt passed, tolerating regular diet and thin liquids. Will complete SLP orders, please reorder if concerns arise.    HPI HPI: [name] is a 58 y.o. female with PMH of seizure, ICH, s/p of "brain aneurysm" repair, asthma, remote hx of bilateral leg DVT, DDD, who presents with seizure. Pt states she had ICH and "brain aneurysm" repair in 1988, since then she has been having seizures. She has been compliant with her Dilantin and Vimpat. She had a witnessed seizure for a long period time. She was taken to the ED where she appears normal. Her head CT shows no acute intracranial abnormalities. Her Dilantin level is therapeutic. Pt admitted to inpatient for further evaluation and treatment.   Pertinent Vitals    SLP Plan       Recommendations                     GO     [name], [name] 05/15/2014, 8:49 AM

## 2014-05-15 NOTE — Progress Notes (Signed)
Patient arrived to room in stable condition. Patient assessed and oriented to room and unit.Telemetry placed. Patient in bed and call light is within reach. Will continue to monitor. Ileene Rubens Noya Santarelli,RN

## 2014-05-15 NOTE — Consult Note (Signed)
NEURO HOSPITALIST CONSULT NOTE    Reason for Consult:seizures  HPI:                                                                                                                                          [name] is an 58 y.o. 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 Had another witnessed seizure while in the ED and neurology was consulted. Patient states she had a similar episode several months back when her brother-in-law died unexpectedly. Patient states that she occasionally has seizures when she is under stress, and stressed that " the stress has been overwhelming lately and I am not getting much sleep".  She is currently on dilantin and vimpat 400 mg daily which she takes faithfully and said that she had failed or had side effects to multiple anticonvulsants including Topamax, Depakote, keppra, phenobarbital, tegretol, and perhaps Lamictal. She indicated that many of these medications actually increase her seizure frequency. Dilantin level today 12.2 She denies HA, vertigo, double vision, difficulty swallowing, focal weakness or numbness, slurred speech, language or vision impairment. No alcohol intake, no use of illicit drugs. CT brain showed no acute intracranial abnormality. Serologies unrevealing.   Past Medical History  Diagnosis Date  . Seizures   . DVT (deep venous thrombosis)   . Asthma   . Cerebral hemorrhage   . Chronic back pain   . DDD (degenerative disc disease) 06/12/2013    Past Surgical History  Procedure Laterality Date  . Cesarean section    . Brain surgery      craniotomy with hematoma evacuation    Family History  Problem Relation Age of Onset   . Heart attack Mother   . Heart attack Father    Social History:  reports that she has been smoking Cigarettes.  She has a 10 pack-year smoking history. She has never used smokeless tobacco. She reports that she does not drink alcohol or use illicit drugs.  Allergies  Allergen Reactions  . Codeine Nausea And Vomiting  . Keppra [Levetiracetam] Other (See Comments)    Causes seizures.   [name] Oil Rash    MEDICATIONS:  I have reviewed the patient's current medications.   ROS:                                                                                                                                       History obtained from the patient and chart review  General ROS: negative for - chills, fatigue, fever, night sweats, weight gain or weight loss Psychological ROS: negative for - behavioral disorder, hallucinations, memory difficulties, mood swings or suicidal ideation Ophthalmic ROS: negative for - blurry vision, double vision, eye pain or loss of vision ENT ROS: negative for - epistaxis, nasal discharge, oral lesions, sore throat, tinnitus or vertigo Allergy and Immunology ROS: negative for - hives or itchy/watery eyes Hematological and Lymphatic ROS: negative for - bleeding problems, bruising or swollen lymph nodes Endocrine ROS: negative for - galactorrhea, hair pattern changes, polydipsia/polyuria or temperature intolerance Respiratory ROS: negative for - cough, hemoptysis, shortness of breath or wheezing Cardiovascular ROS: negative for - chest pain, dyspnea on exertion, edema or irregular heartbeat Gastrointestinal ROS: negative for - abdominal pain, diarrhea, hematemesis, nausea/vomiting or stool incontinence Genito-Urinary ROS: negative for - dysuria, hematuria, incontinence or urinary frequency/urgency Musculoskeletal ROS: negative for - joint swelling or  muscular weakness Neurological ROS: as noted in HPI Dermatological ROS: negative for rash and skin lesion changes  Physical exam: pleasant female in no apparent distress. Blood pressure 101/58, pulse 77, temperature 98.3 F (36.8 C), temperature source Oral, resp. rate 16, height $RemoveBe'5\' 7"'IzWvArHoW$  (1.702 m), weight 55.339 kg (122 lb), SpO2 99.00%. Head: normocephalic. Neck: supple, no bruits, no JVD. Cardiac: no murmurs. Lungs: clear. Abdomen: soft, no tender, no mass. Extremities: no edema. Neurologic Examination:                                                                                                      General: Mental Status: Alert, oriented, thought content appropriate.  Speech fluent without evidence of aphasia.  Able to follow 3 step commands without difficulty. Cranial Nerves: II: Discs flat bilaterally; Visual fields grossly normal, pupils equal, round, reactive to light and accommodation III,IV, VI: ptosis not present, extra-ocular motions intact bilaterally V,VII: smile symmetric, facial light touch sensation normal bilaterally VIII: hearing normal bilaterally IX,X: gag reflex present XI: bilateral shoulder shrug XII: midline tongue extension without atrophy or fasciculations Motor: Right : Upper extremity   5/5    Left:     Upper extremity   5/5  Lower extremity   5/5  Lower extremity   5/5 Tone and bulk:normal tone throughout; no atrophy noted Sensory: Pinprick and light touch intact throughout, bilaterally Deep Tendon Reflexes:  Right: Upper Extremity   Left: Upper extremity   biceps (C-5 to C-6) 2/4   biceps (C-5 to C-6) 2/4 tricep (C7) 2/4    triceps (C7) 2/4 Brachioradialis (C6) 2/4  Brachioradialis (C6) 2/4  Lower Extremity Lower Extremity  quadriceps (L-2 to L-4) 2/4   quadriceps (L-2 to L-4) 2/4 Achilles (S1) 2/4   Achilles (S1) 2/4  Plantars: Right: downgoing   Left: downgoing Coordination: normal finger-to-nose,  normal heel-to-shin test. Head  tremor Gait:  No tested CV: pulses palpable throughout    No results found for this basename: cbc, bmp, coags, chol, tri, ldl, hga1c    Results for orders placed during the hospital encounter of 05/14/14 (from the past 48 hour(s))  CBC     Status: Abnormal   Collection Time    05/14/14  8:50 PM      Result Value Ref Range   WBC 0.5 (*) 4.0 - 10.5 K/uL   Comment: CRITICAL RESULT CALLED TO, READ BACK BY AND VERIFIED WITH:     C.PRICE RN [id number] 05/14/14 E.GADDY     WHITE COUNT CONFIRMED ON SMEAR     REPEATED TO VERIFY     SPECIMEN CHECKED FOR CLOTS   RBC 3.44 (*) 3.87 - 5.11 MIL/uL   Hemoglobin 11.8 (*) 12.0 - 15.0 g/dL   HCT 34.5 (*) 36.0 - 46.0 %   MCV 100.3 (*) 78.0 - 100.0 fL   MCH 34.3 (*) 26.0 - 34.0 pg   MCHC 34.2  30.0 - 36.0 g/dL   RDW 12.5  11.5 - 15.5 %   Platelets <5 (*) 150 - 400 K/uL   Comment: CRITICAL RESULT CALLED TO, READ BACK BY AND VERIFIED WITH:     C.PRICE RN 2213 05/14/14 E.GADDY     PLATELET COUNT CONFIRMED BY SMEAR     REPEATED TO VERIFY     SPECIMEN CHECKED FOR CLOTS  PHENYTOIN LEVEL, TOTAL     Status: None   Collection Time    05/14/14  8:50 PM      Result Value Ref Range   Phenytoin Lvl 12.2  10.0 - 20.0 ug/mL  BASIC METABOLIC PANEL     Status: None   Collection Time    05/14/14  8:50 PM      Result Value Ref Range   Sodium 141  137 - 147 mEq/L   Potassium 4.1  3.7 - 5.3 mEq/L   Chloride 102  96 - 112 mEq/L   CO2 25  19 - 32 mEq/L   Glucose, Bld 99  70 - 99 mg/dL   BUN 6  6 - 23 mg/dL   Creatinine, Ser 0.64  0.50 - 1.10 mg/dL   Calcium 9.2  8.4 - 10.5 mg/dL   GFR calc non Af Amer >90  >90 mL/min   GFR calc Af Amer >90  >90 mL/min   Comment: (NOTE)     The eGFR has been calculated using the CKD EPI equation.     This calculation has not been validated in all clinical situations.     eGFR's persistently <90 mL/min signify possible Chronic Kidney     Disease.   Anion gap 14  5 - 15  URINALYSIS, ROUTINE W REFLEX MICROSCOPIC     Status: None    Collection Time    05/14/14  8:55 PM  Result Value Ref Range   Color, Urine YELLOW  YELLOW   APPearance CLEAR  CLEAR   Specific Gravity, Urine 1.013  1.005 - 1.030   pH 6.0  5.0 - 8.0   Glucose, UA NEGATIVE  NEGATIVE mg/dL   Hgb urine dipstick NEGATIVE  NEGATIVE   Bilirubin Urine NEGATIVE  NEGATIVE   Ketones, ur NEGATIVE  NEGATIVE mg/dL   Protein, ur NEGATIVE  NEGATIVE mg/dL   Urobilinogen, UA 0.2  0.0 - 1.0 mg/dL   Nitrite NEGATIVE  NEGATIVE   Leukocytes, UA NEGATIVE  NEGATIVE   Comment: MICROSCOPIC NOT DONE ON URINES WITH NEGATIVE PROTEIN, BLOOD, LEUKOCYTES, NITRITE, OR GLUCOSE <1000 mg/dL.  CBC WITH DIFFERENTIAL     Status: Abnormal   Collection Time    05/14/14 10:29 PM      Result Value Ref Range   WBC 5.7  4.0 - 10.5 K/uL   RBC 3.83 (*) 3.87 - 5.11 MIL/uL   Hemoglobin 13.2  12.0 - 15.0 g/dL   HCT 39.2  36.0 - 46.0 %   MCV 102.3 (*) 78.0 - 100.0 fL   MCH 34.5 (*) 26.0 - 34.0 pg   MCHC 33.7  30.0 - 36.0 g/dL   RDW 12.6  11.5 - 15.5 %   Platelets 155  150 - 400 K/uL   Comment: REPEATED TO VERIFY     DELTA CHECK NOTED   Neutrophils Relative % 43  43 - 77 %   Neutro Abs 2.4  1.7 - 7.7 K/uL   Lymphocytes Relative 50 (*) 12 - 46 %   Lymphs Abs 2.9  0.7 - 4.0 K/uL   Monocytes Relative 6  3 - 12 %   Monocytes Absolute 0.3  0.1 - 1.0 K/uL   Eosinophils Relative 2  0 - 5 %   Eosinophils Absolute 0.1  0.0 - 0.7 K/uL   Basophils Relative 0  0 - 1 %   Basophils Absolute 0.0  0.0 - 0.1 K/uL    Ct Head Wo Contrast  05/14/2014   CLINICAL DATA:  Seizures.  Initial encounter.  EXAM: CT HEAD WITHOUT CONTRAST  TECHNIQUE: Contiguous axial images were obtained from the base of the skull through the vertex without intravenous contrast.  COMPARISON:  06/11/2013  FINDINGS: Skull and Sinuses:Unchanged appearance of a remote right parietal craniotomy, with the bone flap chronically, mildly depressed along its anterior margin. No acute bony findings.  Orbits: No acute abnormality.   Brain: Extensive encephalomalacia and gliosis centered in the right occipital and parietal regions is unchanged in appearance and extent. There is no neighboring edema or hemorrhage. No evidence of acute infarct, hydrocephalus, mass lesion or shift. Stable mild cerebral volume loss which is generalized.  IMPRESSION: 1. No acute intracranial findings. 2. Stable right parietal-occipital encephalomalacia.   Electronically Signed   By: Jorje Guild M.D.   On: 05/14/2014 23:05   Assessment/Plan: 58 y/o with with refractory structural GTC epilepsy, in the ED after sustaining 2 of her habitual seizures today.  She is back to baseline, with therapeutic dilantin level, CT brain without acute abnormality, unrevealing serologies, no fever or recent medical illnesses, no alcohol or illicit drug use, but significant stress level and very poor sleep lately which I believe could be the reason for her seizures today. She will be observe in the hospital overnight and will continue current dose of AED unless she experiences further seizures. Will follow up.  Dorian Pod, MD 05/15/2014, 1:27 AM

## 2014-05-16 NOTE — ED Provider Notes (Signed)
58 y.o.female with history of seizures who presents today after having witness [place] seizure while visiting with mother who is hospitalized here.  She fell to ground, but denies injuries except sore on knees and elbows and feels mouth is abraded.    WDWN female thin Heent- normal Chest normal Lungs cta Abdomen soft and nontender. Extremities without point ttp A and o x 3,  No focal deficits.  Patient had initial cbc that was abnormal.  Called to bedside as patient had second seizure.  After second seizure repeat cbc normal but patient with extended postictal phase.  I saw and evaluated the patient, reviewed the resident's note and I agree with the findings and plan.  CRITICAL CARE Performed by: Shaune Pollack Total critical care time: 30 Critical care time was exclusive of separately billable procedures and treating other patients. Critical care was necessary to treat or prevent imminent or life-threatening deterioration. Critical care was time spent personally by me on the following activities: development of treatment plan with patient and/or surrogate as well as nursing, discussions with consultants, evaluation of patient's response to treatment, examination of patient, obtaining history from patient or surrogate, ordering and performing treatments and interventions, ordering and review of laboratory studies, ordering and review of radiographic studies, pulse oximetry and re-evaluation of patient's condition.   Shaune Pollack, MD 05/16/14 [phone]

## 2014-07-10 ENCOUNTER — Emergency Department (HOSPITAL_COMMUNITY)
Admission: EM | Admit: 2014-07-10 | Discharge: 2014-07-10 | Disposition: A | Discharge status: Home / Self Care | Payer: Medicare HMO | Attending: Emergency Medicine | Admitting: Emergency Medicine

## 2014-07-10 ENCOUNTER — Encounter (HOSPITAL_COMMUNITY): Payer: Self-pay | Admitting: Emergency Medicine

## 2014-07-10 DIAGNOSIS — Z8679 Personal history of other diseases of the circulatory system: Secondary | ICD-10-CM | POA: Diagnosis not present

## 2014-07-10 DIAGNOSIS — G8929 Other chronic pain: Secondary | ICD-10-CM | POA: Insufficient documentation

## 2014-07-10 DIAGNOSIS — J45909 Unspecified asthma, uncomplicated: Secondary | ICD-10-CM | POA: Diagnosis not present

## 2014-07-10 DIAGNOSIS — Z720 Tobacco use: Secondary | ICD-10-CM | POA: Insufficient documentation

## 2014-07-10 DIAGNOSIS — Z79899 Other long term (current) drug therapy: Secondary | ICD-10-CM | POA: Insufficient documentation

## 2014-07-10 DIAGNOSIS — G40909 Epilepsy, unspecified, not intractable, without status epilepticus: Secondary | ICD-10-CM | POA: Diagnosis not present

## 2014-07-10 DIAGNOSIS — R569 Unspecified convulsions: Secondary | ICD-10-CM

## 2014-07-10 DIAGNOSIS — Z86718 Personal history of other venous thrombosis and embolism: Secondary | ICD-10-CM | POA: Insufficient documentation

## 2014-07-10 LAB — PHENYTOIN LEVEL, TOTAL: PHENYTOIN LVL: 13.6 ug/mL (ref 10.0–20.0)

## 2014-07-10 NOTE — ED Provider Notes (Addendum)
CSN: [id number]     Arrival date & time 07/10/14  0845 History   First MD Initiated Contact with Patient 07/10/14 [phone]     Chief Complaint  Patient presents with  . Seizures     (Consider location/radiation/quality/duration/timing/severity/associated sxs/prior Treatment) HPI Comments: Patient with a history of seizures presents with 2 witnessed seizures this morning. 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 She's had seizures since that time. They've been difficult to control and she is currently on Dilantin and Vimpat. She's followed by a neurologist at [facility]. She presents today with 2 tonic-clonic type seizures that were generalized. The first one was during her sleep about 1 AM. The second one was this morning about 8 AM. These are witnessed by the patient's mother. She is currently alert and oriented. She states that she feels fine. The seizures are typical for her normal type seizures. She denies any recent illnesses. She denies any injuries from the seizures. She states that her last seizure was about a month ago.  Patient is a 58 y.o. female presenting with seizures.  Seizures   Past Medical History  Diagnosis Date  . Seizures   . DVT (deep venous thrombosis)   . Asthma   . Cerebral hemorrhage   . Chronic back pain   . DDD (degenerative disc disease) 06/12/2013   Past Surgical History  Procedure Laterality Date  . Cesarean section    . Brain surgery      craniotomy with hematoma evacuation   Family History  Problem Relation Age of Onset  . Heart attack Mother   . Heart attack Father    History  Substance Use Topics  . Smoking status: Current Every Day Smoker -- 0.50 packs/day for 20 years    Types: Cigarettes  . Smokeless tobacco: Never Used  . Alcohol Use: No   OB History    No data available     Review of Systems  Constitutional: Negative for fever, chills, diaphoresis and fatigue.  HENT:  Negative for congestion, rhinorrhea and sneezing.   Eyes: Negative.   Respiratory: Negative for cough, chest tightness and shortness of breath.   Cardiovascular: Negative for chest pain and leg swelling.  Gastrointestinal: Negative for nausea, vomiting, abdominal pain, diarrhea and blood in stool.  Genitourinary: Negative for frequency, hematuria, flank pain and difficulty urinating.  Musculoskeletal: Negative for back pain and arthralgias.  Skin: Negative for rash.  Neurological: Positive for seizures. Negative for dizziness, speech difficulty, weakness, numbness and headaches.      Allergies  Codeine; Keppra; and Olive oil  Home Medications   Prior to Admission medications   Medication Sig Start Date End Date Taking? Authorizing Provider  lacosamide (VIMPAT) 200 MG TABS tablet Take 200 mg by mouth 2 (two) times daily.   Yes Historical Provider, MD  LORazepam (ATIVAN) 0.5 MG tablet Take 0.5 mg by mouth every 8 (eight) hours as needed for anxiety.  04/23/14  Yes Historical Provider, MD  phenytoin (DILANTIN) 100 MG ER capsule Take 100-200 mg by mouth 2 (two) times daily. Take 1 capsule (100 mg) every morning and 2 capsules (200 mg) every night   Yes Historical Provider, MD   BP 120/59 mmHg  Pulse 74  Temp(Src) 98.7 F (37.1 C) (Oral)  Resp 16  SpO2 96% Physical Exam  Constitutional: She is oriented to person, place, and time. She appears well-developed and well-nourished.  HENT:  Head: Normocephalic and atraumatic.  Eyes:  Pupils are equal, round, and reactive to light.  Neck: Normal range of motion. Neck supple.  Cardiovascular: Normal rate, regular rhythm and normal heart sounds.   Pulmonary/Chest: Effort normal and breath sounds normal. No respiratory distress. She has no wheezes. She has no rales. She exhibits no tenderness.  Abdominal: Soft. Bowel sounds are normal. There is no tenderness. There is no rebound and no guarding.  Musculoskeletal: Normal range of motion. She  exhibits no edema.  Lymphadenopathy:    She has no cervical adenopathy.  Neurological: She is alert and oriented to person, place, and time. She has normal strength. No cranial nerve deficit or sensory deficit. GCS eye subscore is 4. GCS verbal subscore is 5. GCS motor subscore is 6.  Finger to nose intact  Skin: Skin is warm and dry. No rash noted.  Psychiatric: She has a normal mood and affect.    ED Course  Procedures (including critical care time) Labs Review Labs Reviewed  PHENYTOIN LEVEL, TOTAL    Imaging Review No results found.   EKG Interpretation None      Date: 07/10/2014  Rate: 72  Rhythm: normal sinus rhythm  QRS Axis: normal  Intervals: normal  ST/T Wave abnormalities: nonspecific ST/T changes  Conduction Disutrbances:none  Narrative Interpretation:   Old EKG Reviewed: none available   MDM   Final diagnoses:  Seizure    Patient has had no seizure activity in the ED. She is well-appearing. She has no headache or neurologic dysfunction that would warrant CT imaging studies. She is back to baseline. She has no apparent injuries from her seizure. It appears that she's having the same number of seizures that she typically has. I did advise her to keep in contact with her neurologist and return here as needed for any worsening symptoms. Her Dilantin level seems to be therapeutic.    Malvin Johns, MD 07/10/14 [place], MD 07/10/14 [phone]

## 2014-07-10 NOTE — Discharge Instructions (Signed)

## 2014-07-10 NOTE — ED Notes (Signed)
Per EMS pt had two witnessed seizures at 0100 and 0750 this morning. Pt hx of cerebral hemorrhage. Pt alert and oriented x4 and denies pain on arrival.

## 2014-07-10 NOTE — ED Notes (Signed)
Unsuccessful IV attempt by this RN. Wendall Mola at bedside attempting [place] IV.

## 2014-07-10 NOTE — ED Notes (Signed)
Bed: NU27 Expected date:  Expected time:  Means of arrival:  Comments: 64f post sezuire

## 2014-07-26 ENCOUNTER — Encounter (HOSPITAL_COMMUNITY): Payer: Self-pay

## 2014-07-26 ENCOUNTER — Emergency Department (HOSPITAL_COMMUNITY)
Admission: EM | Admit: 2014-07-26 | Discharge: 2014-07-27 | Disposition: A | Discharge status: Home / Self Care | Payer: Medicare HMO | Attending: Emergency Medicine | Admitting: Emergency Medicine

## 2014-07-26 DIAGNOSIS — G8929 Other chronic pain: Secondary | ICD-10-CM | POA: Insufficient documentation

## 2014-07-26 DIAGNOSIS — R7889 Finding of other specified substances, not normally found in blood: Secondary | ICD-10-CM | POA: Diagnosis not present

## 2014-07-26 DIAGNOSIS — Z86718 Personal history of other venous thrombosis and embolism: Secondary | ICD-10-CM | POA: Diagnosis not present

## 2014-07-26 DIAGNOSIS — R569 Unspecified convulsions: Secondary | ICD-10-CM

## 2014-07-26 DIAGNOSIS — J45909 Unspecified asthma, uncomplicated: Secondary | ICD-10-CM | POA: Diagnosis not present

## 2014-07-26 DIAGNOSIS — Z79899 Other long term (current) drug therapy: Secondary | ICD-10-CM | POA: Diagnosis not present

## 2014-07-26 DIAGNOSIS — Z8739 Personal history of other diseases of the musculoskeletal system and connective tissue: Secondary | ICD-10-CM | POA: Insufficient documentation

## 2014-07-26 DIAGNOSIS — G40909 Epilepsy, unspecified, not intractable, without status epilepticus: Secondary | ICD-10-CM | POA: Insufficient documentation

## 2014-07-26 DIAGNOSIS — Z720 Tobacco use: Secondary | ICD-10-CM | POA: Insufficient documentation

## 2014-07-26 LAB — URINALYSIS, ROUTINE W REFLEX MICROSCOPIC
Bilirubin Urine: NEGATIVE
GLUCOSE, UA: NEGATIVE mg/dL
Hgb urine dipstick: NEGATIVE
Ketones, ur: NEGATIVE mg/dL
LEUKOCYTES UA: NEGATIVE
Nitrite: NEGATIVE
PH: 6.5 (ref 5.0–8.0)
Protein, ur: NEGATIVE mg/dL
SPECIFIC GRAVITY, URINE: 1.018 (ref 1.005–1.030)
Urobilinogen, UA: 0.2 mg/dL (ref 0.0–1.0)

## 2014-07-26 LAB — CBC
HEMATOCRIT: 39.5 % (ref 36.0–46.0)
HEMOGLOBIN: 13.1 g/dL (ref 12.0–15.0)
MCH: 34.1 pg — ABNORMAL HIGH (ref 26.0–34.0)
MCHC: 33.2 g/dL (ref 30.0–36.0)
MCV: 102.9 fL — ABNORMAL HIGH (ref 78.0–100.0)
Platelets: 150 10*3/uL (ref 150–400)
RBC: 3.84 MIL/uL — ABNORMAL LOW (ref 3.87–5.11)
RDW: 12.7 % (ref 11.5–15.5)
WBC: 4.6 10*3/uL (ref 4.0–10.5)

## 2014-07-26 LAB — BASIC METABOLIC PANEL
Anion gap: 6 (ref 5–15)
BUN: 11 mg/dL (ref 6–23)
CHLORIDE: 107 meq/L (ref 96–112)
CO2: 29 mmol/L (ref 19–32)
Calcium: 9.3 mg/dL (ref 8.4–10.5)
Creatinine, Ser: 0.59 mg/dL (ref 0.50–1.10)
GFR calc Af Amer: >90 mL/min (ref >90)
GFR calc non Af Amer: >90 mL/min (ref >90)
Glucose, Bld: 103 mg/dL — ABNORMAL HIGH (ref 70–99)
POTASSIUM: 3.6 mmol/L (ref 3.5–5.1)
Sodium: 142 mmol/L (ref 135–145)

## 2014-07-26 LAB — PHENYTOIN LEVEL, TOTAL: Phenytoin Lvl: 9 ug/mL — ABNORMAL LOW (ref 10.0–20.0)

## 2014-07-26 MED ORDER — PHENYTOIN SODIUM EXTENDED 100 MG PO CAPS
400.0000 mg | ORAL_CAPSULE | Freq: Once | ORAL | Status: AC
  Administered 2014-07-27: 400 mg via ORAL
  Filled 2014-07-26: qty 4

## 2014-07-26 MED ORDER — LORAZEPAM 2 MG/ML IJ SOLN
1.0000 mg | Freq: Once | INTRAMUSCULAR | Status: AC
  Administered 2014-07-26: 1 mg via INTRAVENOUS
  Filled 2014-07-26: qty 1

## 2014-07-26 MED ORDER — LACOSAMIDE 50 MG PO TABS
200.0000 mg | ORAL_TABLET | Freq: Once | ORAL | Status: AC
  Administered 2014-07-26: 200 mg via ORAL

## 2014-07-26 MED ORDER — PHENYTOIN SODIUM EXTENDED 100 MG PO CAPS
200.0000 mg | ORAL_CAPSULE | Freq: Once | ORAL | Status: AC
  Administered 2014-07-26: 200 mg via ORAL
  Filled 2014-07-26: qty 2

## 2014-07-26 NOTE — Discharge Instructions (Signed)
1. Medications: usual home medications 2. Treatment: rest, drink plenty of fluids,  3. Follow Up: Please followup with your primary doctor in 3 days for discussion of your diagnoses and further evaluation after today's visit; if you do not have a primary care doctor use the resource guide provided to find one; Please return to the ER for additional seizures

## 2014-07-26 NOTE — ED Provider Notes (Signed)
CSN: [id number]     Arrival date & time 07/26/14  1923 History   First MD Initiated Contact with Patient 07/26/14 1925     Chief Complaint  Patient presents with  . Seizures     (Consider location/radiation/quality/duration/timing/severity/associated sxs/prior Treatment) Patient is a 58 y.o. female presenting with seizures. The history is provided by the patient and medical records. No language interpreter was used.  Seizures    [name] is a 58 y.o. female  with a hx of  seizures presents to the emergency department after 3 witnessed tonic Clonic seizures at 5am, 9am and 7pm.  Seizures were witnessed by her mother, each lasting approx 2 min. 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 She's had seizures since that time and more recently they have been difficult to control and she is currently on Dilantin and Vimpat. She reports that she is very careful about her medication regime and she has not missed any doses.  She's followed by a neurologist at [facility]. EMS reports she was alert and oriented upon their arrival without bowel or bladder incontinence. The seizures are typical for her normal type seizures. She denies any recent illnesses, injuries from the seizures. She states that her last seizure was less than a month ago.  No aggravating or alleviating factors. Patient reports associated headache tonight. She reports headache is generalized and throbbing. She states she has had headaches after seizures in the past but it has been some time. Pt denies fever, chills, neck pain, neck stiffness, chest pain, shortness of breath, abdominal pain, nausea, vomiting, diarrhea weakness, dizziness, dysuria, hematuria.  Past Medical History  Diagnosis Date  . Seizures   . DVT (deep venous thrombosis)   . Asthma   . Cerebral hemorrhage   . Chronic back pain   . DDD (degenerative disc disease) 06/12/2013   Past Surgical History   Procedure Laterality Date  . Cesarean section    . Brain surgery      craniotomy with hematoma evacuation   Family History  Problem Relation Age of Onset  . Heart attack Mother   . Heart attack Father    History  Substance Use Topics  . Smoking status: Current Every Day Smoker -- 0.50 packs/day for 20 years    Types: Cigarettes  . Smokeless tobacco: Never Used  . Alcohol Use: No   OB History    No data available     Review of Systems  Constitutional: Negative for fever, diaphoresis, appetite change, fatigue and unexpected weight change.  HENT: Negative for mouth sores.   Eyes: Negative for visual disturbance.  Respiratory: Negative for cough, chest tightness, shortness of breath and wheezing.   Cardiovascular: Negative for chest pain.  Gastrointestinal: Negative for nausea, vomiting, abdominal pain, diarrhea and constipation.  Endocrine: Negative for polydipsia, polyphagia and polyuria.  Genitourinary: Negative for dysuria, urgency, frequency and hematuria.  Musculoskeletal: Negative for back pain and neck stiffness.  Skin: Negative for rash.  Allergic/Immunologic: Negative for immunocompromised state.  Neurological: Positive for seizures and headaches. Negative for syncope and light-headedness.  Hematological: Does not bruise/bleed easily.  Psychiatric/Behavioral: Negative for sleep disturbance. The patient is not nervous/anxious.       Allergies  Codeine; Keppra; and Olive oil  Home Medications   Prior to Admission medications   Medication Sig Start Date End Date Taking? Authorizing Provider  lacosamide (VIMPAT) 200 MG TABS tablet Take 200 mg by mouth 2 (two)  times daily.   Yes Historical Provider, MD  LORazepam (ATIVAN) 0.5 MG tablet Take 0.5 mg by mouth every 8 (eight) hours as needed for anxiety.  04/23/14  Yes Historical Provider, MD  phenytoin (DILANTIN) 100 MG ER capsule Take 100-200 mg by mouth 2 (two) times daily. Take 1 capsule (100 mg) every morning and 2  capsules (200 mg) every night   Yes Historical Provider, MD   BP 116/54 mmHg  Pulse 70  Temp(Src) 99.4 F (37.4 C) (Oral)  Resp 21  SpO2 93% Physical Exam  Constitutional: She is oriented to person, place, and time. She appears well-developed and well-nourished. No distress.  Awake, alert, nontoxic appearance  HENT:  Head: Normocephalic and atraumatic.  Mouth/Throat: Oropharynx is clear and moist. No oropharyngeal exudate.  Eyes: Conjunctivae and EOM are normal. Pupils are equal, round, and reactive to light. No scleral icterus.  No horizontal, vertical or rotational nystagmus  Neck: Normal range of motion. Neck supple.  Full active and passive ROM without pain No midline or paraspinal tenderness No nuchal rigidity or meningeal signs  Cardiovascular: Normal rate, regular rhythm, normal heart sounds and intact distal pulses.   No murmur heard. Pulmonary/Chest: Effort normal and breath sounds normal. No respiratory distress. She has no wheezes. She has no rales.  Equal chest expansion  Abdominal: Soft. Bowel sounds are normal. She exhibits no mass. There is no tenderness. There is no rebound and no guarding.  Musculoskeletal: Normal range of motion. She exhibits no edema.  Lymphadenopathy:    She has no cervical adenopathy.  Neurological: She is alert and oriented to person, place, and time. She has normal reflexes. No cranial nerve deficit. She exhibits normal muscle tone. Coordination normal.  Mental Status:  Alert, oriented, thought content appropriate. Speech fluent without evidence of aphasia. Able to follow 2 step commands without difficulty.  Cranial Nerves:  II:  Peripheral visual fields grossly normal, pupils equal, round, reactive to light III,IV, VI: ptosis not present, extra-ocular motions intact bilaterally  V,VII: smile symmetric, facial light touch sensation equal VIII: hearing grossly normal bilaterally  IX,X: gag reflex present  XI: bilateral shoulder shrug equal  and strong XII: midline tongue extension  Motor:  5/5 in upper and lower extremities bilaterally including strong and equal grip strength and dorsiflexion/plantar flexion Sensory: Pinprick and light touch normal in all extremities.  Deep Tendon Reflexes: 2+ and symmetric  Cerebellar: normal finger-to-nose with bilateral upper extremities Gait: gait testing deferred CV: distal pulses palpable throughout   Skin: Skin is warm and dry. No rash noted. She is not diaphoretic.  Psychiatric: She has a normal mood and affect. Her behavior is normal. Judgment and thought content normal.  Nursing note and vitals reviewed.   ED Course  Procedures (including critical care time) Labs Review Labs Reviewed  CBC - Abnormal; Notable for the following:    RBC 3.84 (*)    MCV 102.9 (*)    MCH 34.1 (*)    All other components within normal limits  BASIC METABOLIC PANEL - Abnormal; Notable for the following:    Glucose, Bld 103 (*)    All other components within normal limits  PHENYTOIN LEVEL, TOTAL - Abnormal; Notable for the following:    Phenytoin Lvl 9.0 (*)    All other components within normal limits  URINALYSIS, ROUTINE W REFLEX MICROSCOPIC    Imaging Review No results found.   EKG Interpretation None      MDM   Final diagnoses:  Seizure  Dilantin  level too low   [place] presents after several witnessed tonic clonic seizures.  Patient without status epilepticus. She is alert and oriented without evidence of injury. No oral trauma. Normal neurologic exam. We'll check basic blood work, monitor and reassess.  10:10PM Patient with decreased Dilantin level. Resolution of headache and patient remains neurologically intact. She ambulates without difficulty. Will monitor for several more hours and orally loaded Dilantin at 11:30. Pt given Ativan 1mg  to prevent further seizures and her home medications were given at her normal time ~9:30PM.  11:45PM Patient given oral Dilantin  load. Will be discharged home. She remains neurologically intact. Continues to ambulate without difficulty here in the emergency department. She is alert, oriented, nontoxic and nonseptic appearing.  She reports that she feels well. Encouraged close follow-up with her primary care physician.  I have personally reviewed patient's vitals, nursing note and any pertinent labs or imaging.  I performed an undressed physical exam.    It has been determined that no acute conditions requiring further emergency intervention are present at this time. The patient/guardian have been advised of the diagnosis and plan. I reviewed all labs and imaging including any potential incidental findings. We have discussed signs and symptoms that warrant return to the ED and they are listed in the discharge instructions.    Vital signs are stable at discharge.   BP 116/54 mmHg  Pulse 70  Temp(Src) 99.4 F (37.4 C) (Oral)  Resp 21  SpO2 93%   The patient was discussed with Dr. Betsey Holiday who agrees with the treatment plan.   Jarrett Soho Kadar Chance, PA-C 07/27/14 0007  Orpah Greek, MD 07/29/14 [phone]

## 2014-07-26 NOTE — ED Notes (Signed)
Per EMS, Patient reports having three seizures today.  The most recent was approximately one hour ago, lasting two minutes.  EMS found patient to be alert and oriented upon their arrival to transport patient.

## 2014-08-08 IMAGING — CT CT HEAD W/O CM
1 series · 16 of 30 positions shown, 20 images · non-contrast
Comparison: Head CT 03/02/2012.

CLINICAL DATA: Seizure.

CT HEAD WITHOUT CONTRAST
TECHNIQUE: Contiguous axial images were obtained from the base of
the skull through the vertex without contrast.

[Series 2: head trauma 4.8 h37s · axial · 0.43mm/px · z∈[-145,+9]mm · 16 of 36 slices shown, 20 images]
[im 2/36  brain]
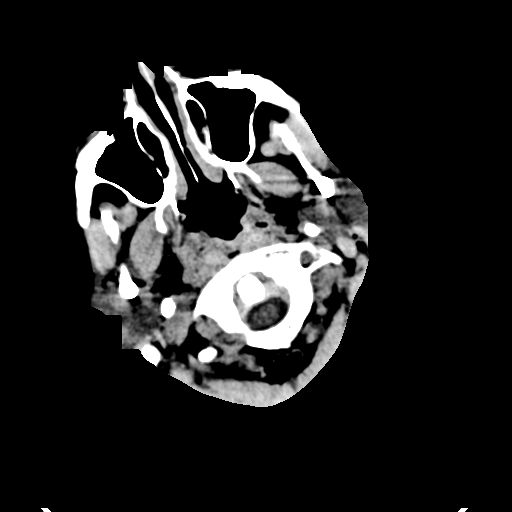
[im 2/36  bone]
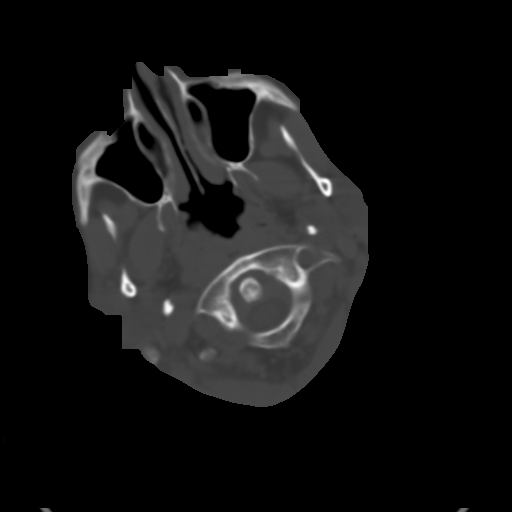
[im 4/36  brain]
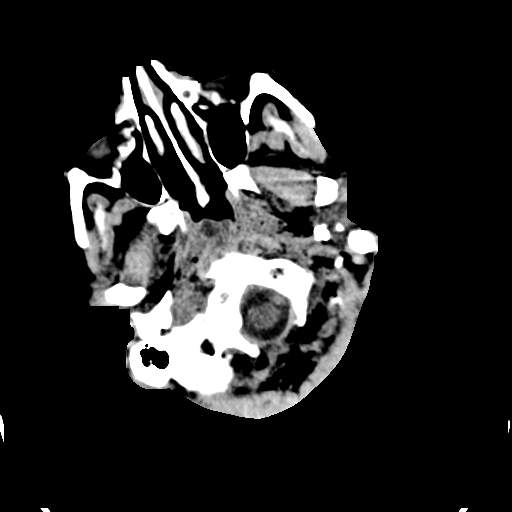
[im 7/36  brain]
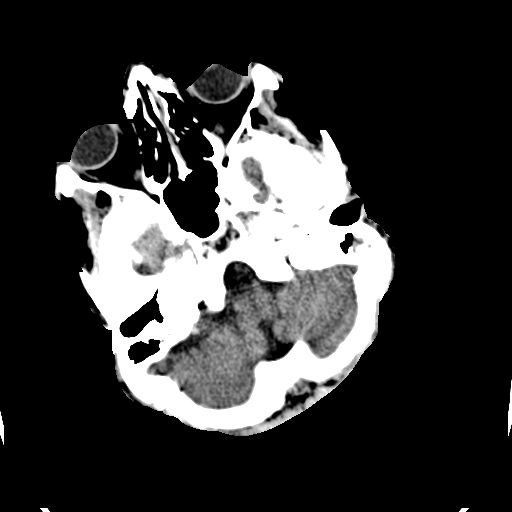
[im 9/36  brain]
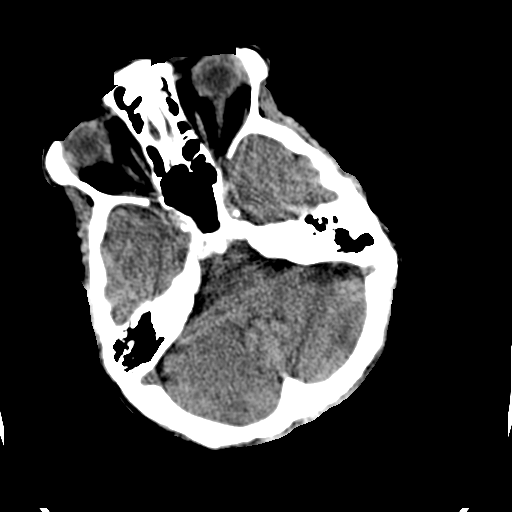
[im 10/36  brain]
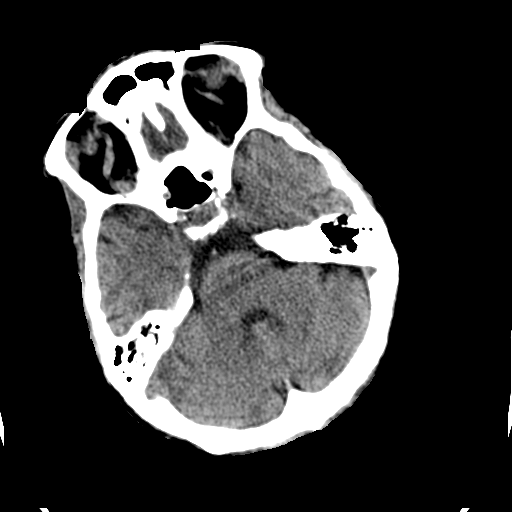
[im 10/36  bone]
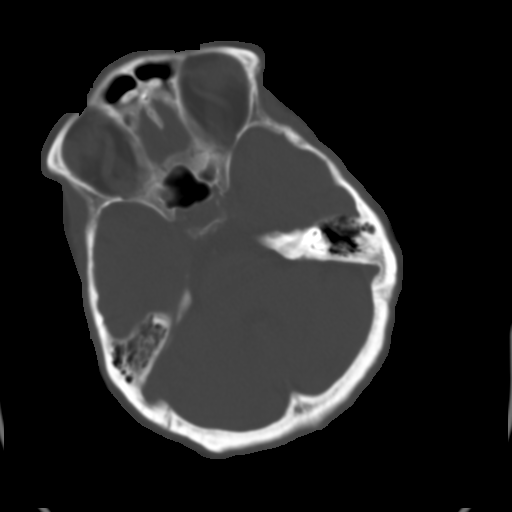
[im 13/36  brain]
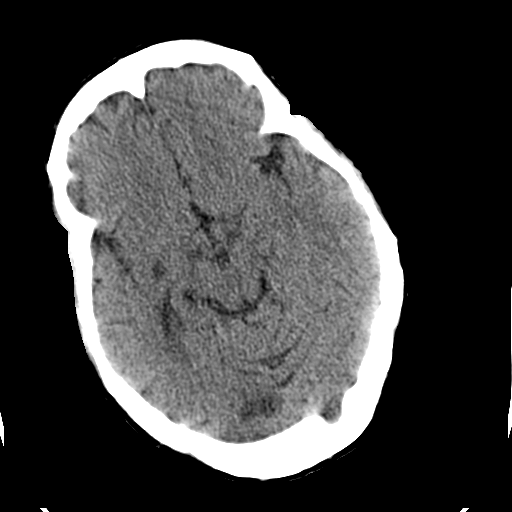
[im 15/36  brain]
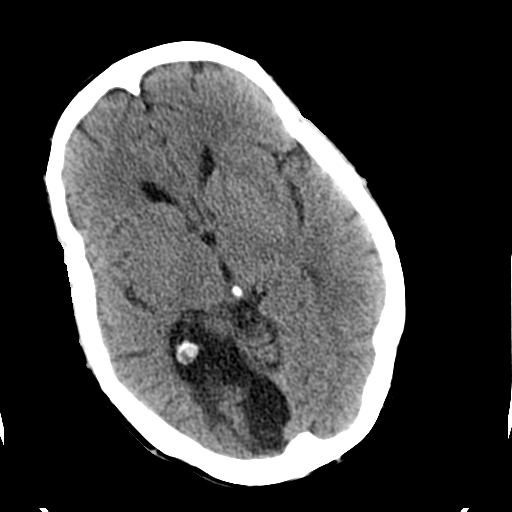
[im 17/36  brain]
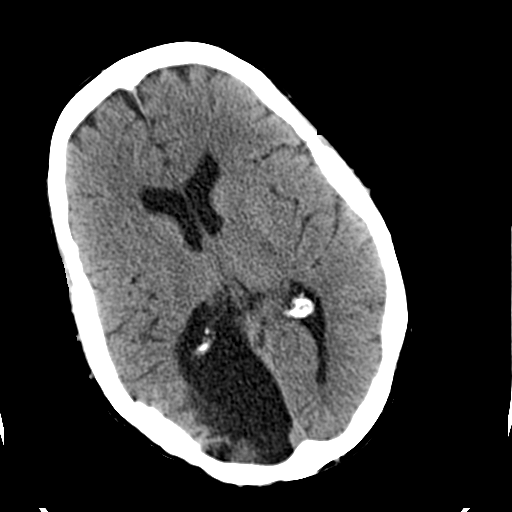
[im 19/36  brain]
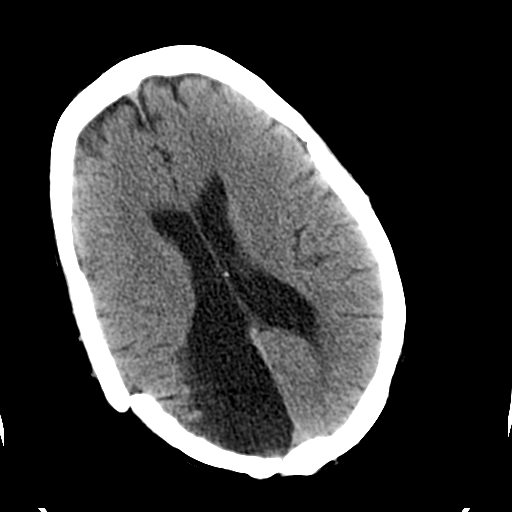
[im 19/36  bone]
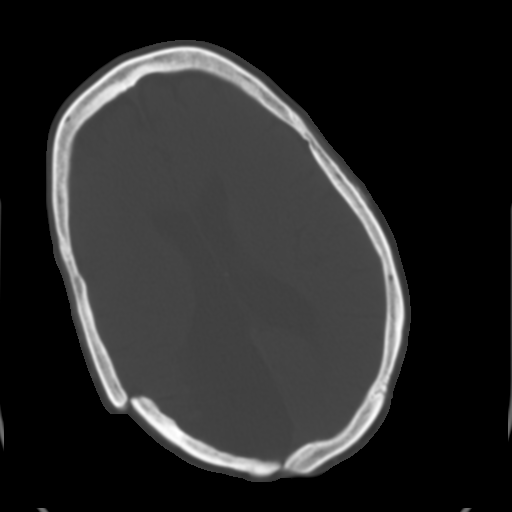
[im 21/36  brain]
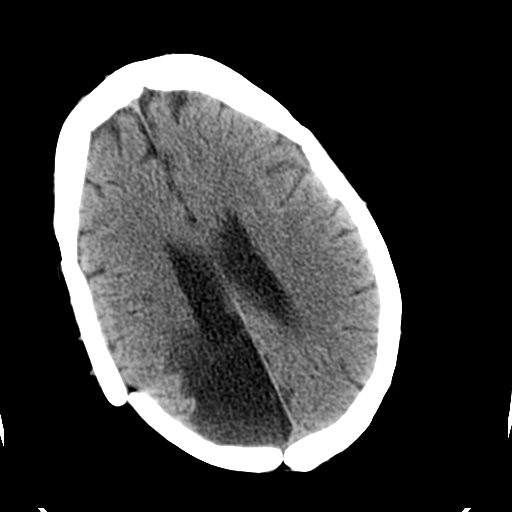
[im 23/36  brain]
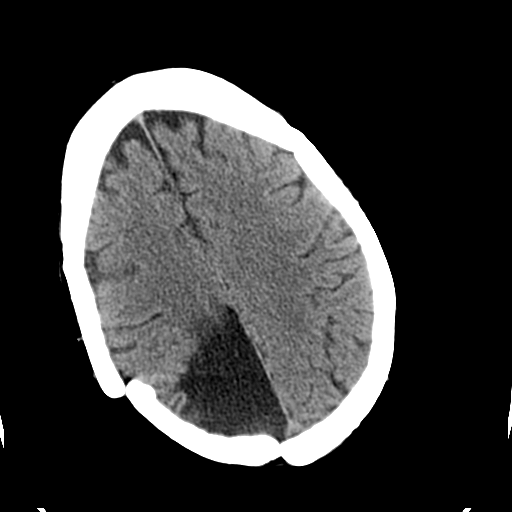
[im 26/36  brain]
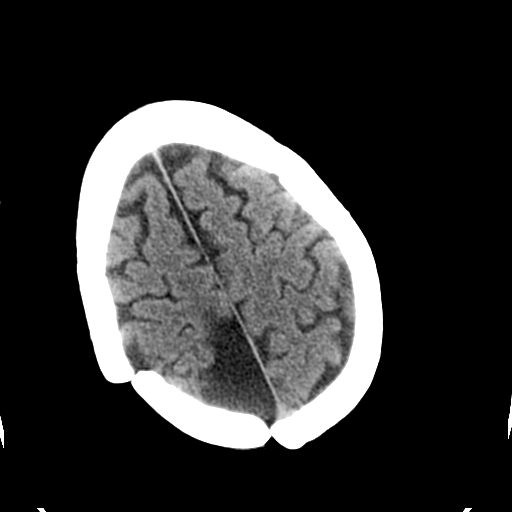
[im 27/36  brain]
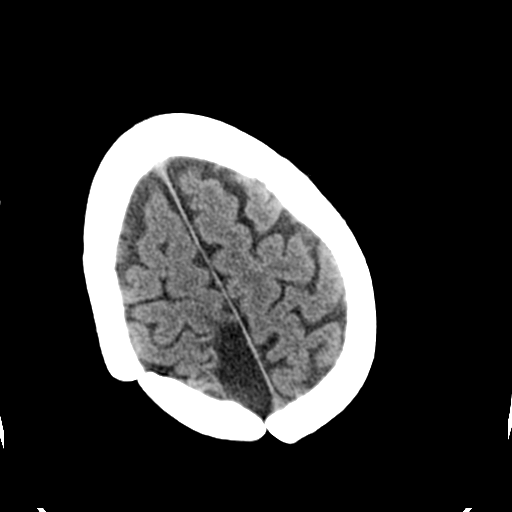
[im 27/36  bone]
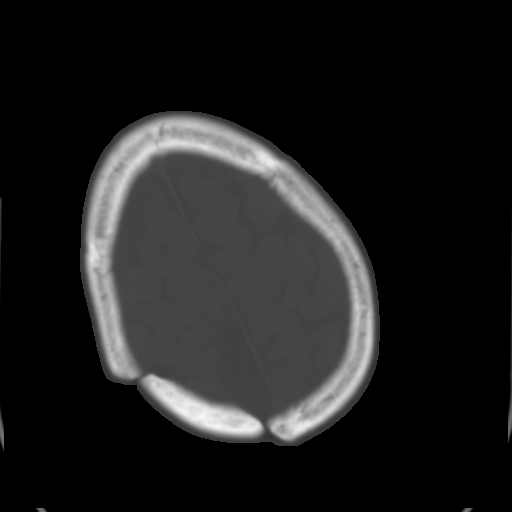
[im 29/36  brain]
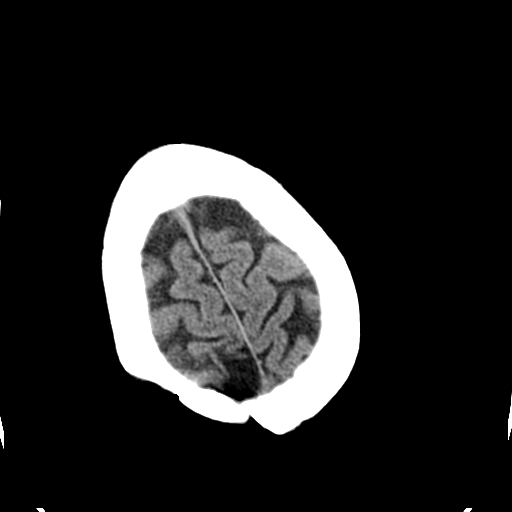
[im 32/36  brain]
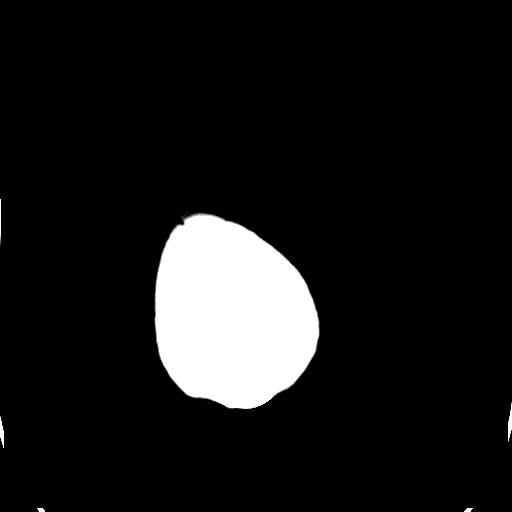
[im 34/36  brain]
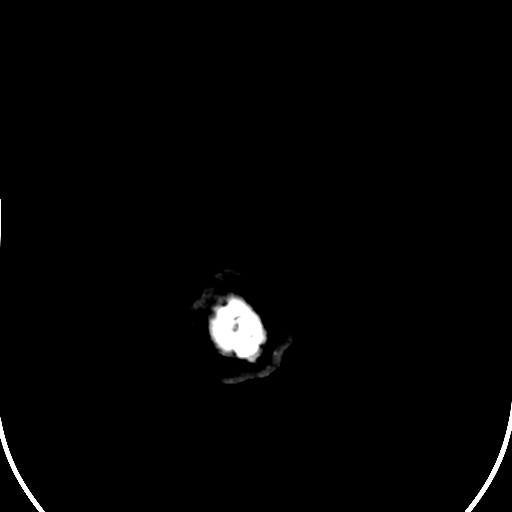

[16 of 30 positions shown; findings below may reference images not displayed]

FINDINGS: Large area of encephalomalacia in the right parieto-
occipital region is unchanged.  No acute intracranial abnormality.
Specifically, no signs of acute intracranial hemorrhage, no
definite region to suggest acute/subacute cerebral ischemia, no
mass, mass effect or hydrocephalous.  Postoperative changes of
right parietal craniotomy.  No acute displaced skull fractures are
identified.  Visualized paranasal sinuses and mastoids pneumatized,
with exception of a small left mastoid effusion which appears to be
chronic.
IMPRESSION: 1.  No acute intracranial abnormalities.
2.  The appearance of the brain is similar to prior examinations
with postoperative changes and large area of encephalomalacia in
the right lateral occipital regions, as above.
3.  Small chronic left mastoid effusion is unchanged.

## 2014-08-16 ENCOUNTER — Encounter (HOSPITAL_COMMUNITY): Payer: Self-pay

## 2014-08-16 ENCOUNTER — Emergency Department (HOSPITAL_COMMUNITY)
Admission: EM | Admit: 2014-08-16 | Discharge: 2014-08-16 | Disposition: A | Discharge status: Home / Self Care | Payer: Medicare HMO | Attending: Emergency Medicine | Admitting: Emergency Medicine

## 2014-08-16 DIAGNOSIS — Z720 Tobacco use: Secondary | ICD-10-CM | POA: Insufficient documentation

## 2014-08-16 DIAGNOSIS — J45909 Unspecified asthma, uncomplicated: Secondary | ICD-10-CM | POA: Insufficient documentation

## 2014-08-16 DIAGNOSIS — R569 Unspecified convulsions: Secondary | ICD-10-CM | POA: Diagnosis not present

## 2014-08-16 DIAGNOSIS — Z86718 Personal history of other venous thrombosis and embolism: Secondary | ICD-10-CM | POA: Insufficient documentation

## 2014-08-16 DIAGNOSIS — G40909 Epilepsy, unspecified, not intractable, without status epilepticus: Secondary | ICD-10-CM | POA: Insufficient documentation

## 2014-08-16 DIAGNOSIS — G8929 Other chronic pain: Secondary | ICD-10-CM | POA: Insufficient documentation

## 2014-08-16 DIAGNOSIS — Z8739 Personal history of other diseases of the musculoskeletal system and connective tissue: Secondary | ICD-10-CM | POA: Insufficient documentation

## 2014-08-16 DIAGNOSIS — Z79899 Other long term (current) drug therapy: Secondary | ICD-10-CM | POA: Diagnosis not present

## 2014-08-16 LAB — BASIC METABOLIC PANEL
Anion gap: 5 (ref 5–15)
BUN: 11 mg/dL (ref 6–23)
CO2: 27 mmol/L (ref 19–32)
Calcium: 8.7 mg/dL (ref 8.4–10.5)
Chloride: 106 mEq/L (ref 96–112)
Creatinine, Ser: 0.58 mg/dL (ref 0.50–1.10)
GFR calc Af Amer: >90 mL/min (ref >90)
GLUCOSE: 86 mg/dL (ref 70–99)
Potassium: 4 mmol/L (ref 3.5–5.1)
SODIUM: 138 mmol/L (ref 135–145)

## 2014-08-16 LAB — CBC
HEMATOCRIT: 40.5 % (ref 36.0–46.0)
HEMOGLOBIN: 13.4 g/dL (ref 12.0–15.0)
MCH: 33.8 pg (ref 26.0–34.0)
MCHC: 33.1 g/dL (ref 30.0–36.0)
MCV: 102.3 fL — AB (ref 78.0–100.0)
Platelets: 135 10*3/uL — ABNORMAL LOW (ref 150–400)
RBC: 3.96 MIL/uL (ref 3.87–5.11)
RDW: 12.6 % (ref 11.5–15.5)
WBC: 3.4 10*3/uL — ABNORMAL LOW (ref 4.0–10.5)

## 2014-08-16 LAB — PHENYTOIN LEVEL, TOTAL: Phenytoin Lvl: 16.4 ug/mL (ref 10.0–20.0)

## 2014-08-16 LAB — CBG MONITORING, ED: Glucose-Capillary: 77 mg/dL (ref 70–99)

## 2014-08-16 NOTE — Discharge Instructions (Signed)

## 2014-08-16 NOTE — ED Notes (Signed)
Bed: WA14 Expected date:  Expected time:  Means of arrival:  Comments: EMS-seizures 

## 2014-08-16 NOTE — ED Provider Notes (Signed)
CSN: [id number]     Arrival date & time 08/16/14  1316 History   First MD Initiated Contact with Patient 08/16/14 1507     Chief Complaint  Patient presents with  . Seizures     (Consider location/radiation/quality/duration/timing/severity/associated sxs/prior Treatment) HPI   Patient to the ER brought by sister after having a seizure. She has seizure disorder, is treated at [facility] and takes Dilantin. She reports being compliant with her medications. She has been under increased stress recently which she attributes to her increase in seizures. It lasted less than 10 minutes but it is not entirely clear how long the seizure was. She has a f/u appointment with her neurologist in [place] coming up to discuss changing seizure medications soon due to poor control on Dilantin. Prior to arrival the patient is back to baseline. She is awake, alert and oriented. Requests to get dress and go home. Denies intra-oral injury or pain anywhere. + generalized muscle soreness. Pt denies fever, chills, neck pain, neck stiffness, chest pain, shortness of breath, abdominal pain, nausea, vomiting, diarrhea weakness, dizziness, dysuria, hematuria.  Past Medical History  Diagnosis Date  . Seizures   . DVT (deep venous thrombosis)   . Asthma   . Cerebral hemorrhage   . Chronic back pain   . DDD (degenerative disc disease) 06/12/2013   Past Surgical History  Procedure Laterality Date  . Cesarean section    . Brain surgery      craniotomy with hematoma evacuation   Family History  Problem Relation Age of Onset  . Heart attack Mother   . Heart attack Father    History  Substance Use Topics  . Smoking status: Current Every Day Smoker -- 0.50 packs/day for 20 years    Types: Cigarettes  . Smokeless tobacco: Never Used  . Alcohol Use: No   OB History    No data available     Review of Systems  10 Systems reviewed and are negative for acute change except as noted in the HPI.   Allergies  Codeine;  Keppra; and Olive oil  Home Medications   Prior to Admission medications   Medication Sig Start Date End Date Taking? Authorizing Provider  lacosamide (VIMPAT) 200 MG TABS tablet Take 200 mg by mouth 2 (two) times daily.   Yes Historical Provider, MD  LORazepam (ATIVAN) 0.5 MG tablet Take 0.5 mg by mouth every 8 (eight) hours as needed for anxiety (anxiety).  04/23/14  Yes Historical Provider, MD  phenytoin (DILANTIN) 100 MG ER capsule Take 100-200 mg by mouth 2 (two) times daily. Take 1 capsule (100 mg) every morning and 2 capsules (200 mg) every night   Yes Historical Provider, MD   BP 112/49 mmHg  Pulse 70  Temp(Src) 98.3 F (36.8 C) (Oral)  Resp 20  SpO2 95% Physical Exam  Constitutional: She is oriented to person, place, and time. She appears well-developed and well-nourished. No distress.  HENT:  Head: Normocephalic and atraumatic.  Eyes: Pupils are equal, round, and reactive to light.  Neck: Normal range of motion. Neck supple. No spinous process tenderness and no muscular tenderness present. No Brudzinski's sign noted.  Cardiovascular: Normal rate and regular rhythm.   Pulmonary/Chest: Effort normal.  Abdominal: Soft. Bowel sounds are normal. There is no tenderness. There is no rigidity, no rebound and no guarding.  Neurological: She is alert and oriented to person, place, and time. She has normal strength.  + headache and seizures  Skin: Skin is warm and dry.  Nursing note and vitals reviewed.     ED Course  Procedures (including critical care time) Labs Review Labs Reviewed  CBC - Abnormal; Notable for the following:    WBC 3.4 (*)    MCV 102.3 (*)    Platelets 135 (*)    All other components within normal limits  PHENYTOIN LEVEL, TOTAL  BASIC METABOLIC PANEL  CBG MONITORING, ED  CBG MONITORING, ED    Imaging Review No results found.   EKG Interpretation None      MDM   Final diagnoses:  Seizure    Patient on arrival had returned to baseline, her  seizures have been increasing infrequency due to family stress. She is therapeutic on her Dilantin on lab work, she denies symptoms of infection and her blood work is unremarkable.   Patient had f/u with [facility] coming up in the next week regarding changing her medication. She agrees to call and let them know about her seizure today.  59 y.o.[name]'s evaluation in the Emergency Department is complete. It has been determined that no acute conditions requiring further emergency intervention are present at this time. The patient/guardian have been advised of the diagnosis and plan. We have discussed signs and symptoms that warrant return to the ED, such as changes or worsening in symptoms.  Vital signs are stable at discharge. Filed Vitals:   08/16/14 1500  BP: 112/49  Pulse: 70  Temp:   Resp: 20    Patient/guardian has voiced understanding and agreed to follow-up with the PCP or specialist.     Linus Mako, PA-C 08/16/14 1647  Quintella Reichert, MD 08/16/14 1728

## 2014-08-16 NOTE — ED Notes (Signed)
She was found by her sister shortly after having had a seizure at home.  She was confused upon EMS arrival, however, she is now oriented x 4 with clear speech.  She is in no distress.

## 2014-08-16 NOTE — ED Notes (Signed)
Pt alert and oriented x4. Respirations even and unlabored, bilateral symmetrical rise and fall of chest. Skin warm and dry. In no acute distress. Denies needs.   

## 2014-08-16 NOTE — ED Notes (Signed)
Pt escorted to discharge window. Pt verbalized understanding discharge instructions. In no acute distress.  

## 2014-09-11 ENCOUNTER — Encounter (HOSPITAL_COMMUNITY): Payer: Self-pay | Admitting: Emergency Medicine

## 2014-09-11 ENCOUNTER — Emergency Department (HOSPITAL_COMMUNITY)
Admission: EM | Admit: 2014-09-11 | Discharge: 2014-09-12 | Disposition: A | Discharge status: Home / Self Care | Payer: Commercial Managed Care - HMO | Attending: Emergency Medicine | Admitting: Emergency Medicine

## 2014-09-11 DIAGNOSIS — Z79899 Other long term (current) drug therapy: Secondary | ICD-10-CM | POA: Diagnosis not present

## 2014-09-11 DIAGNOSIS — Z720 Tobacco use: Secondary | ICD-10-CM | POA: Diagnosis not present

## 2014-09-11 DIAGNOSIS — Z86718 Personal history of other venous thrombosis and embolism: Secondary | ICD-10-CM | POA: Insufficient documentation

## 2014-09-11 DIAGNOSIS — R569 Unspecified convulsions: Secondary | ICD-10-CM

## 2014-09-11 DIAGNOSIS — G40909 Epilepsy, unspecified, not intractable, without status epilepticus: Secondary | ICD-10-CM | POA: Diagnosis not present

## 2014-09-11 DIAGNOSIS — Z8679 Personal history of other diseases of the circulatory system: Secondary | ICD-10-CM | POA: Insufficient documentation

## 2014-09-11 DIAGNOSIS — J45901 Unspecified asthma with (acute) exacerbation: Secondary | ICD-10-CM | POA: Insufficient documentation

## 2014-09-11 DIAGNOSIS — G8929 Other chronic pain: Secondary | ICD-10-CM | POA: Insufficient documentation

## 2014-09-11 NOTE — ED Notes (Signed)
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 Pt had a seizure today about 30 minutes ago, grand mal seizure. Pt's family reported this was her 2nd seizure in the past 3 hours. Pt A&O x 4 upon EMS arrival, no evident injury, no urinary incontinence. Pt's only complaint is weakness.

## 2014-09-11 NOTE — ED Notes (Signed)
Bed: AG53 Expected date:  Expected time:  Means of arrival:  Comments: EMS 59 yo female with seizure x 2-hx seizures

## 2014-09-12 DIAGNOSIS — Z79899 Other long term (current) drug therapy: Secondary | ICD-10-CM | POA: Diagnosis not present

## 2014-09-12 DIAGNOSIS — G40909 Epilepsy, unspecified, not intractable, without status epilepticus: Secondary | ICD-10-CM | POA: Diagnosis not present

## 2014-09-12 DIAGNOSIS — Z86718 Personal history of other venous thrombosis and embolism: Secondary | ICD-10-CM | POA: Diagnosis not present

## 2014-09-12 DIAGNOSIS — Z8679 Personal history of other diseases of the circulatory system: Secondary | ICD-10-CM | POA: Diagnosis not present

## 2014-09-12 DIAGNOSIS — J45901 Unspecified asthma with (acute) exacerbation: Secondary | ICD-10-CM | POA: Diagnosis not present

## 2014-09-12 DIAGNOSIS — G8929 Other chronic pain: Secondary | ICD-10-CM | POA: Diagnosis not present

## 2014-09-12 DIAGNOSIS — Z720 Tobacco use: Secondary | ICD-10-CM | POA: Diagnosis not present

## 2014-09-12 LAB — CBC WITH DIFFERENTIAL/PLATELET
Basophils Absolute: 0 10*3/uL (ref 0.0–0.1)
Basophils Relative: 0 % (ref 0–1)
Eosinophils Absolute: 0.1 10*3/uL (ref 0.0–0.7)
Eosinophils Relative: 1 % (ref 0–5)
HCT: 41.1 % (ref 36.0–46.0)
Hemoglobin: 13.5 g/dL (ref 12.0–15.0)
Lymphocytes Relative: 50 % — ABNORMAL HIGH (ref 12–46)
Lymphs Abs: 2.5 10*3/uL (ref 0.7–4.0)
MCH: 34.1 pg — ABNORMAL HIGH (ref 26.0–34.0)
MCHC: 32.8 g/dL (ref 30.0–36.0)
MCV: 103.8 fL — ABNORMAL HIGH (ref 78.0–100.0)
Monocytes Absolute: 0.3 10*3/uL (ref 0.1–1.0)
Monocytes Relative: 6 % (ref 3–12)
Neutro Abs: 2.1 10*3/uL (ref 1.7–7.7)
Neutrophils Relative %: 43 % (ref 43–77)
Platelets: 144 10*3/uL — ABNORMAL LOW (ref 150–400)
RBC: 3.96 MIL/uL (ref 3.87–5.11)
RDW: 12.6 % (ref 11.5–15.5)
WBC: 4.9 10*3/uL (ref 4.0–10.5)

## 2014-09-12 LAB — BASIC METABOLIC PANEL
Anion gap: 6 (ref 5–15)
BUN: 12 mg/dL (ref 6–23)
CO2: 27 mmol/L (ref 19–32)
Calcium: 8.8 mg/dL (ref 8.4–10.5)
Chloride: 107 mmol/L (ref 96–112)
Creatinine, Ser: 0.64 mg/dL (ref 0.50–1.10)
GFR calc Af Amer: >90 mL/min (ref >90)
GFR calc non Af Amer: >90 mL/min (ref >90)
Glucose, Bld: 98 mg/dL (ref 70–99)
Potassium: 4.3 mmol/L (ref 3.5–5.1)
Sodium: 140 mmol/L (ref 135–145)

## 2014-09-12 LAB — PHENYTOIN LEVEL, TOTAL: Phenytoin Lvl: 14.8 ug/mL (ref 10.0–20.0)

## 2014-09-12 MED ORDER — LORAZEPAM 2 MG/ML IJ SOLN
1.0000 mg | Freq: Once | INTRAMUSCULAR | Status: AC
  Administered 2014-09-12: 1 mg via INTRAVENOUS
  Filled 2014-09-12: qty 1

## 2014-09-12 MED ORDER — KETOROLAC TROMETHAMINE 15 MG/ML IJ SOLN
15.0000 mg | Freq: Once | INTRAMUSCULAR | Status: AC
  Administered 2014-09-12: 15 mg via INTRAVENOUS
  Filled 2014-09-12: qty 1

## 2014-09-12 MED ORDER — PHENYTOIN SODIUM EXTENDED 100 MG PO CAPS
100.0000 mg | ORAL_CAPSULE | Freq: Once | ORAL | Status: AC
  Administered 2014-09-12: 100 mg via ORAL
  Filled 2014-09-12: qty 1

## 2014-09-12 NOTE — ED Notes (Signed)
Pt had drifted off to sleep before being able to call for ride. Pt alert at this time. Medication will be given for neck pain. Pt noted to have difficulty typing password into phone. Awaiting ride before pt will be discharged.

## 2014-09-12 NOTE — ED Notes (Signed)
Dr. Wilson Singer at bedside, pt requesting pain medication. Will give order.

## 2014-09-12 NOTE — ED Notes (Signed)
Pt ambulated to BR with 1 stand by assist at this time

## 2014-09-12 NOTE — Discharge Instructions (Signed)

## 2014-09-12 NOTE — ED Provider Notes (Signed)
CSN: [id number]     Arrival date & time 09/11/14  2214 History   First MD Initiated Contact with Patient 09/12/14 0147     Chief Complaint  Patient presents with  . Seizures     (Consider location/radiation/quality/duration/timing/severity/associated sxs/prior Treatment) HPI   58yF with seizure. Two shortly before arrival. Unusual for her to have consecutive ones like this so EMS called. Typically has 1-2x/month. Currently no complaints. No fever or chills. No neck pain. No acute visual changes, numbness, tingling or focal loss of strength. Denies ingestion. Reports compliance with her medications.   Past Medical History  Diagnosis Date  . Seizures   . DVT (deep venous thrombosis)   . Asthma   . Cerebral hemorrhage   . Chronic back pain   . DDD (degenerative disc disease) 06/12/2013   Past Surgical History  Procedure Laterality Date  . Cesarean section    . Brain surgery      craniotomy with hematoma evacuation   Family History  Problem Relation Age of Onset  . Heart attack Mother   . Heart attack Father    History  Substance Use Topics  . Smoking status: Current Every Day Smoker -- 0.50 packs/day for 20 years    Types: Cigarettes  . Smokeless tobacco: Never Used  . Alcohol Use: No   OB History    No data available     Review of Systems  All systems reviewed and negative, other than as noted in HPI.   Allergies  Codeine; Keppra; and Olive oil  Home Medications   Prior to Admission medications   Medication Sig Start Date End Date Taking? Authorizing Provider  lacosamide (VIMPAT) 200 MG TABS tablet Take 200 mg by mouth 2 (two) times daily.   Yes Historical Provider, MD  LORazepam (ATIVAN) 0.5 MG tablet Take 0.5 mg by mouth every 8 (eight) hours as needed for anxiety (anxiety).  04/23/14  Yes Historical Provider, MD  phenytoin (DILANTIN) 100 MG ER capsule Take 100-200 mg by mouth 2 (two) times daily. Take 1 capsule (100 mg) every morning and 2 capsules (200 mg)  every night   Yes Historical Provider, MD   BP 115/69 mmHg  Pulse 83  Temp(Src) 99.1 F (37.3 C) (Oral)  Resp 20  SpO2 99% Physical Exam  Constitutional: She is oriented to person, place, and time. She appears well-developed and well-nourished. No distress.  HENT:  Head: Normocephalic and atraumatic.  Eyes: Conjunctivae are normal. Right eye exhibits no discharge. Left eye exhibits no discharge.  Neck: Neck supple.  No nuchal rigidity  Cardiovascular: Normal rate, regular rhythm and normal heart sounds.  Exam reveals no gallop and no friction rub.   No murmur heard. Pulmonary/Chest: Effort normal. No respiratory distress. She has wheezes.  Abdominal: Soft. She exhibits no distension. There is no tenderness.  Musculoskeletal: She exhibits no edema or tenderness.  Neurological: She is alert and oriented to person, place, and time. No cranial nerve deficit. She exhibits normal muscle tone. Coordination normal.  Speech clear. Content appropriate. Follows commands. CN 2-12 intact. Strength 5/5 b/l u/l ext. Sensation intact to light touch. Good finger to nose b/l.   Skin: Skin is warm and dry.  Psychiatric: She has a normal mood and affect. Her behavior is normal. Thought content normal.  Nursing note and vitals reviewed.   ED Course  Procedures (including critical care time) Labs Review Labs Reviewed  CBC WITH DIFFERENTIAL/PLATELET - Abnormal; Notable for the following:    MCV 103.8 (*)  MCH 34.1 (*)    Platelets 144 (*)    Lymphocytes Relative 50 (*)    All other components within normal limits  PHENYTOIN LEVEL, TOTAL  BASIC METABOLIC PANEL    Imaging Review No results found.   EKG Interpretation None      MDM   Final diagnoses:  Seizure    58yf with seizure. Hx of same. Back to baseline. Afebrile. HD stable. No further complaints. Phenytoin level therapeutic. Given home dose while in ED.    Virgel Manifold, MD 09/21/14 [phone]

## 2014-09-12 NOTE — ED Notes (Signed)
CBG 125, Dr. Wilson Singer made aware, not crossing over in computer.

## 2014-09-12 NOTE — ED Notes (Signed)
Pt states that she has been unable to reach her children for a ride stating "I fell asleep." CN aware of delay.

## 2014-09-13 LAB — CBG MONITORING, ED: Glucose-Capillary: 125 mg/dL — ABNORMAL HIGH (ref 70–99)

## 2014-10-12 DIAGNOSIS — G40219 Localization-related (focal) (partial) symptomatic epilepsy and epileptic syndromes with complex partial seizures, intractable, without status epilepticus: Secondary | ICD-10-CM | POA: Diagnosis not present

## 2014-10-12 DIAGNOSIS — G248 Other dystonia: Secondary | ICD-10-CM | POA: Diagnosis not present

## 2014-10-12 DIAGNOSIS — F411 Generalized anxiety disorder: Secondary | ICD-10-CM | POA: Diagnosis not present

## 2014-10-12 DIAGNOSIS — I619 Nontraumatic intracerebral hemorrhage, unspecified: Secondary | ICD-10-CM | POA: Diagnosis not present

## 2014-10-12 DIAGNOSIS — G40109 Localization-related (focal) (partial) symptomatic epilepsy and epileptic syndromes with simple partial seizures, not intractable, without status epilepticus: Secondary | ICD-10-CM | POA: Diagnosis not present

## 2014-10-12 DIAGNOSIS — Z9889 Other specified postprocedural states: Secondary | ICD-10-CM | POA: Diagnosis not present

## 2014-10-12 DIAGNOSIS — Z681 Body mass index (BMI) 19 or less, adult: Secondary | ICD-10-CM | POA: Diagnosis not present

## 2014-10-12 DIAGNOSIS — Z5181 Encounter for therapeutic drug level monitoring: Secondary | ICD-10-CM | POA: Diagnosis not present

## 2014-10-12 DIAGNOSIS — Q283 Other malformations of cerebral vessels: Secondary | ICD-10-CM | POA: Diagnosis not present

## 2014-10-17 ENCOUNTER — Other Ambulatory Visit: Payer: Self-pay | Admitting: Internal Medicine

## 2014-10-17 DIAGNOSIS — R05 Cough: Secondary | ICD-10-CM | POA: Diagnosis not present

## 2014-10-17 DIAGNOSIS — M79675 Pain in left toe(s): Secondary | ICD-10-CM | POA: Diagnosis not present

## 2014-10-17 DIAGNOSIS — M7989 Other specified soft tissue disorders: Secondary | ICD-10-CM | POA: Diagnosis not present

## 2014-10-17 DIAGNOSIS — F17200 Nicotine dependence, unspecified, uncomplicated: Secondary | ICD-10-CM | POA: Diagnosis not present

## 2014-10-18 ENCOUNTER — Ambulatory Visit
Admission: RE | Admit: 2014-10-18 | Discharge: 2014-10-18 | Disposition: A | Discharge status: Home / Self Care | Payer: Commercial Managed Care - HMO | Source: Ambulatory Visit | Attending: Internal Medicine | Admitting: Internal Medicine

## 2014-10-18 DIAGNOSIS — M79661 Pain in right lower leg: Secondary | ICD-10-CM | POA: Diagnosis not present

## 2014-10-18 DIAGNOSIS — Z720 Tobacco use: Secondary | ICD-10-CM | POA: Diagnosis not present

## 2014-10-18 DIAGNOSIS — Z86718 Personal history of other venous thrombosis and embolism: Secondary | ICD-10-CM | POA: Diagnosis not present

## 2014-10-18 DIAGNOSIS — M7989 Other specified soft tissue disorders: Secondary | ICD-10-CM | POA: Diagnosis not present

## 2014-10-20 IMAGING — CT CT HEAD W/O CM
2 series · 16 of 30 positions shown, 19 images · non-contrast
Comparison: 06/28/2012

CLINICAL DATA: Seizures.  History of previous seizures,
postoperative changes, and previous skull fracture and hemorrhage.

CT HEAD WITHOUT CONTRAST
TECHNIQUE: Contiguous axial images were obtained from the base of
the skull through the vertex without contrast.

[Series 2: head w/o · axial · non-contrast · 0.43mm/px · z∈[-164,-44]mm · 9 of 31 slices shown, 12 images]
[im 4/31  brain]
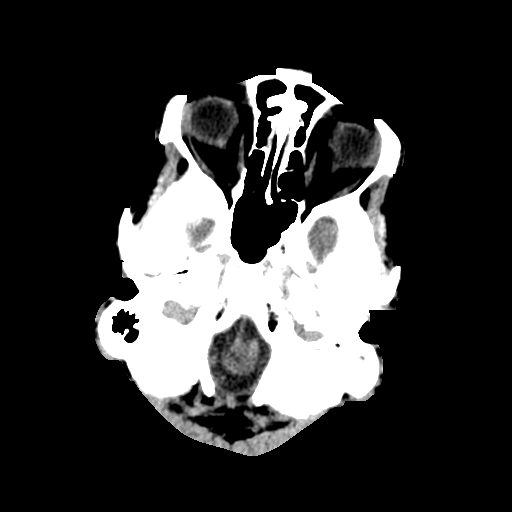
[im 4/31  bone]
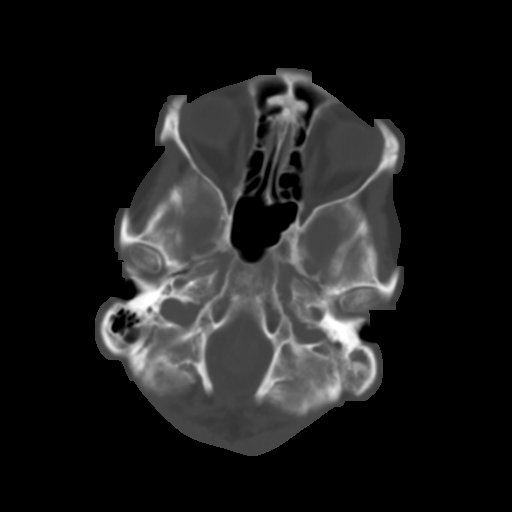
[im 7/31  brain]
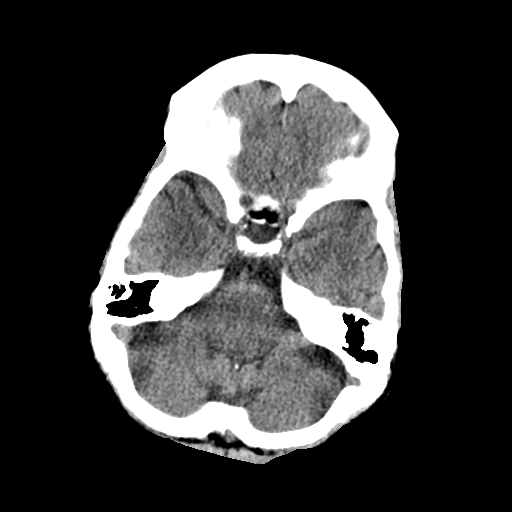
[im 10/31  brain]
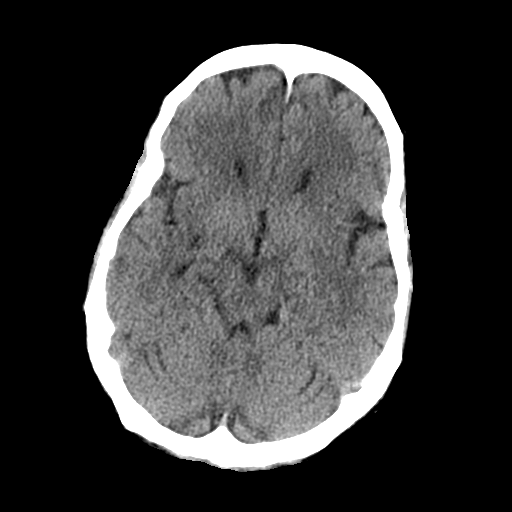
[im 13/31  brain]
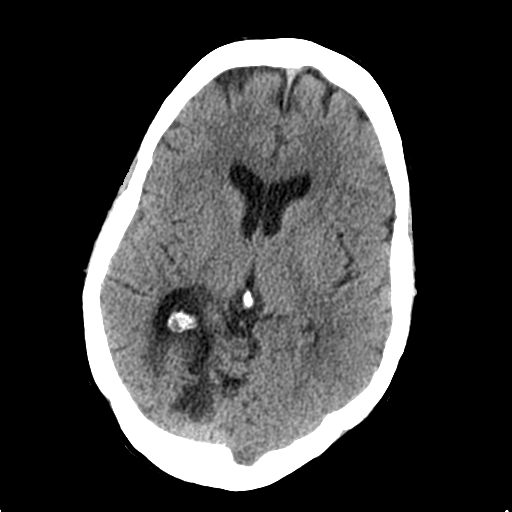
[im 16/31  brain]
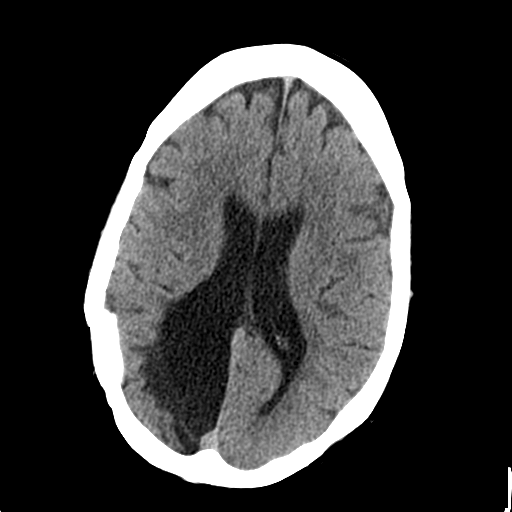
[im 16/31  bone]
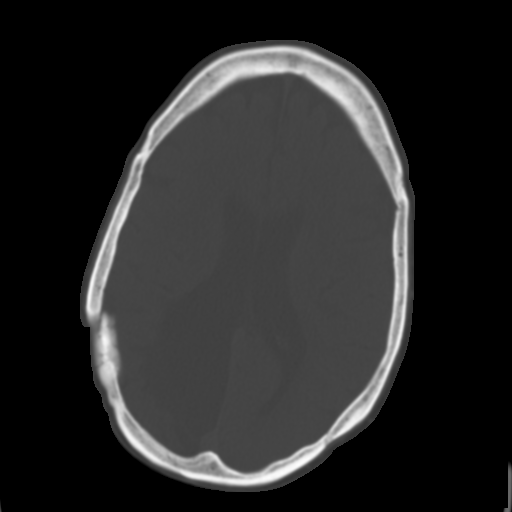
[im 19/31  brain]
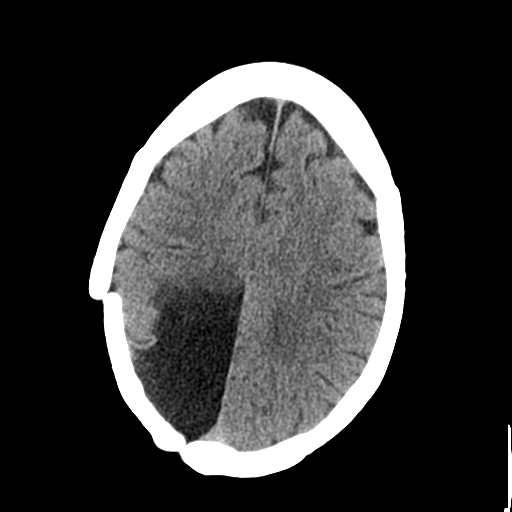
[im 22/31  brain]
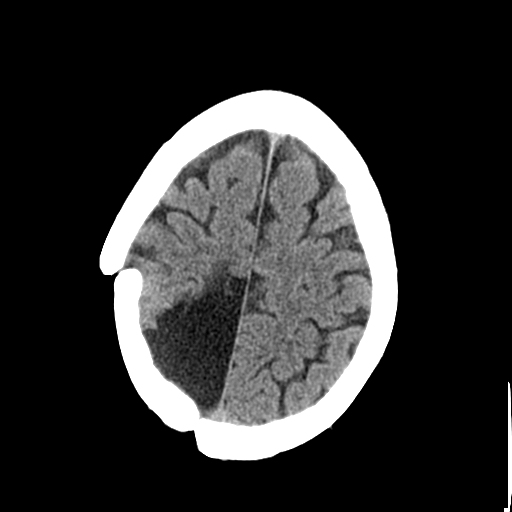
[im 25/31  brain]
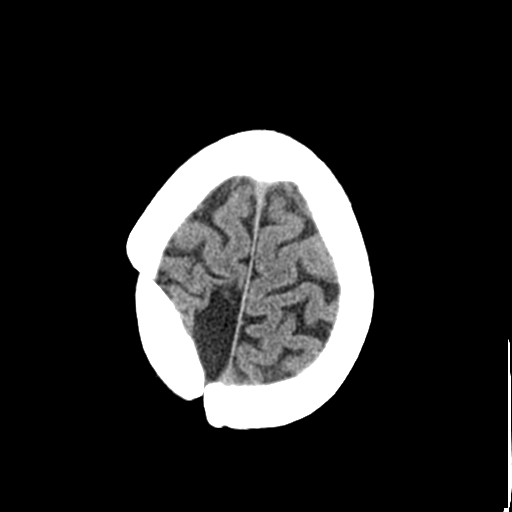
[im 28/31  brain]
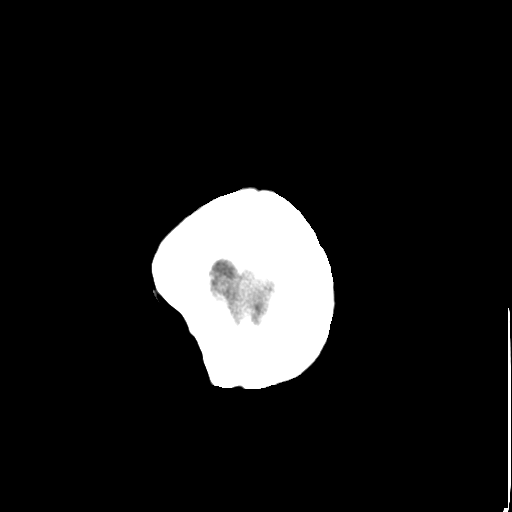
[im 28/31  bone]
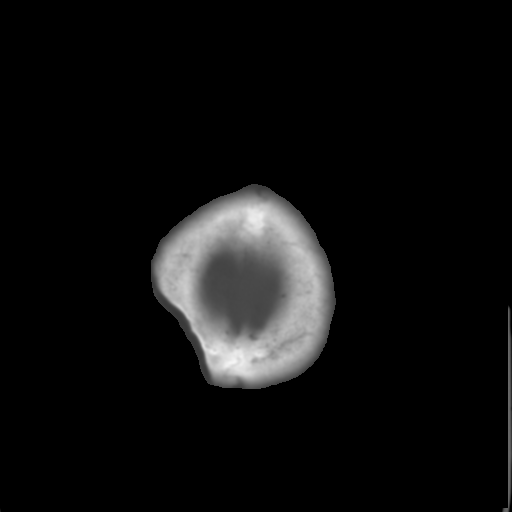

[Series 3: bone windows · axial · 0.43mm/px · z∈[-164,-65]mm · 7 of 51 slices shown]
[im 6/51  bone]
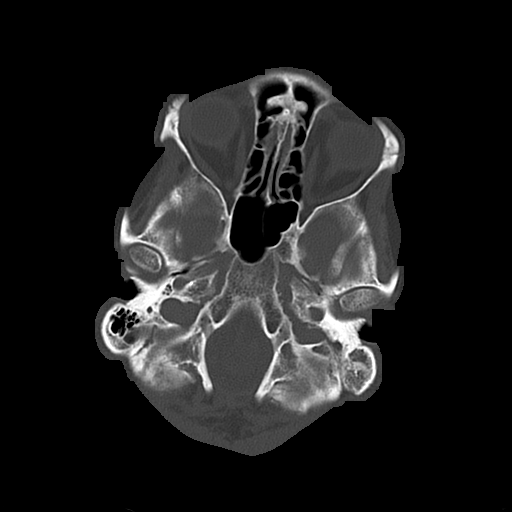
[im 12/51  bone]
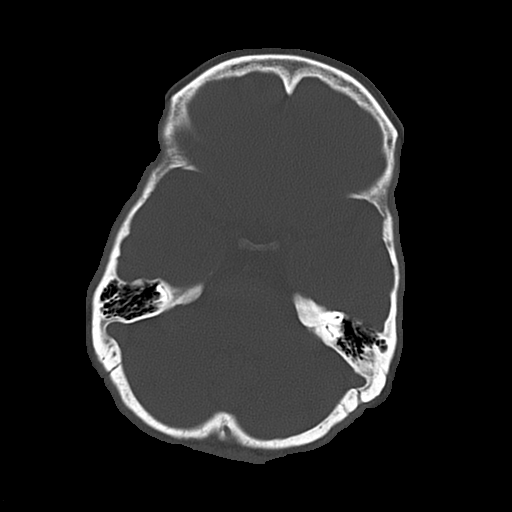
[im 17/51  bone]
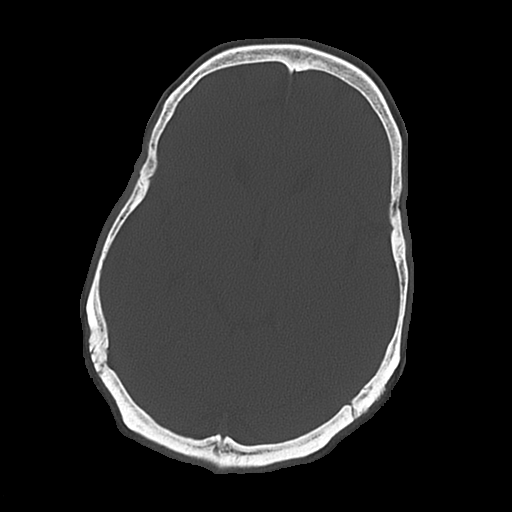
[im 23/51  bone]
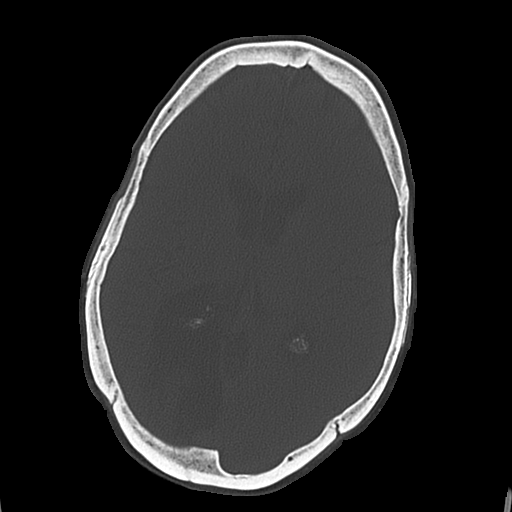
[im 28/51  bone]
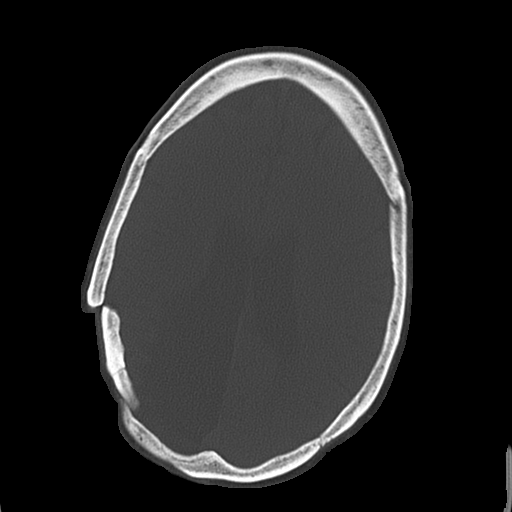
[im 34/51  bone]
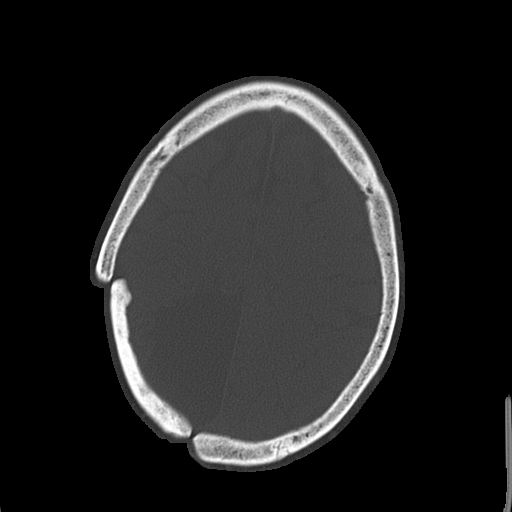
[im 39/51  bone]
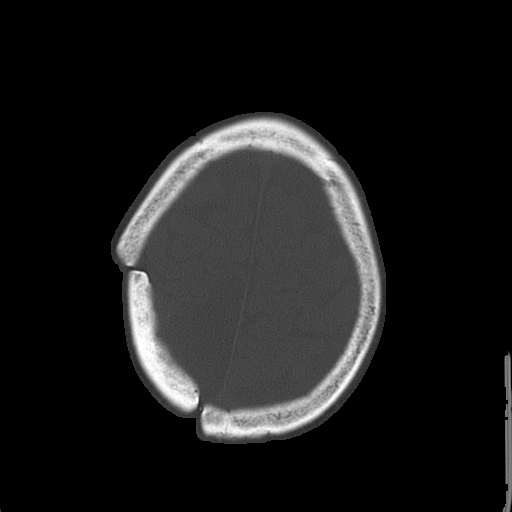

[16 of 30 positions shown; findings below may reference images not displayed]

FINDINGS: Postoperative changes with right parietal craniotomy.
Bone flap is mildly depressed anteriorly.  This appearance is
stable.  There is a large area of encephalomalacia involving the
right posterior parietal and occipital regions.  This is also
stable.  There is associated dilatation of the posterior horn of
the right lateral ventricle.  There is no mass effect or midline
shift.  There is no abnormal extra-axial fluid collection.  Gray-
white matter junctions are distinct.  Basal cisterns are not
effaced.  No evidence of acute intracranial hemorrhage.  Stable
opacification of portions of the left mastoid air cells.  Mucosal
thickening in the paranasal sinuses.
IMPRESSION: No acute intracranial abnormalities.  Stable appearance of the
brain and skull since previous studies.

## 2014-10-22 DIAGNOSIS — F419 Anxiety disorder, unspecified: Secondary | ICD-10-CM | POA: Diagnosis present

## 2014-11-09 ENCOUNTER — Encounter (HOSPITAL_COMMUNITY): Payer: Self-pay | Admitting: Emergency Medicine

## 2014-11-09 ENCOUNTER — Observation Stay (HOSPITAL_COMMUNITY): Payer: Commercial Managed Care - HMO

## 2014-11-09 ENCOUNTER — Observation Stay (HOSPITAL_COMMUNITY)
Admission: EM | Admit: 2014-11-09 | Discharge: 2014-11-10 | Disposition: A | Discharge status: Home / Self Care | Payer: Commercial Managed Care - HMO | Attending: Internal Medicine | Admitting: Internal Medicine

## 2014-11-09 DIAGNOSIS — E46 Unspecified protein-calorie malnutrition: Secondary | ICD-10-CM | POA: Diagnosis not present

## 2014-11-09 DIAGNOSIS — G4089 Other seizures: Secondary | ICD-10-CM | POA: Diagnosis not present

## 2014-11-09 DIAGNOSIS — Z7901 Long term (current) use of anticoagulants: Secondary | ICD-10-CM | POA: Diagnosis not present

## 2014-11-09 DIAGNOSIS — F17210 Nicotine dependence, cigarettes, uncomplicated: Secondary | ICD-10-CM | POA: Diagnosis not present

## 2014-11-09 DIAGNOSIS — D696 Thrombocytopenia, unspecified: Secondary | ICD-10-CM | POA: Diagnosis not present

## 2014-11-09 DIAGNOSIS — R569 Unspecified convulsions: Secondary | ICD-10-CM

## 2014-11-09 DIAGNOSIS — J45909 Unspecified asthma, uncomplicated: Secondary | ICD-10-CM | POA: Diagnosis not present

## 2014-11-09 DIAGNOSIS — G40909 Epilepsy, unspecified, not intractable, without status epilepticus: Secondary | ICD-10-CM | POA: Diagnosis not present

## 2014-11-09 DIAGNOSIS — Z885 Allergy status to narcotic agent status: Secondary | ICD-10-CM | POA: Insufficient documentation

## 2014-11-09 DIAGNOSIS — K219 Gastro-esophageal reflux disease without esophagitis: Secondary | ICD-10-CM | POA: Insufficient documentation

## 2014-11-09 DIAGNOSIS — S0990XA Unspecified injury of head, initial encounter: Secondary | ICD-10-CM | POA: Diagnosis not present

## 2014-11-09 DIAGNOSIS — Z8673 Personal history of transient ischemic attack (TIA), and cerebral infarction without residual deficits: Secondary | ICD-10-CM | POA: Insufficient documentation

## 2014-11-09 DIAGNOSIS — G40409 Other generalized epilepsy and epileptic syndromes, not intractable, without status epilepticus: Secondary | ICD-10-CM | POA: Diagnosis present

## 2014-11-09 DIAGNOSIS — G8929 Other chronic pain: Secondary | ICD-10-CM | POA: Insufficient documentation

## 2014-11-09 HISTORY — DX: Generalized contraction of visual field, unspecified eye: H53.489

## 2014-11-09 HISTORY — DX: Cerebral infarction, unspecified: I63.9

## 2014-11-09 HISTORY — DX: Personal history of other diseases of the digestive system: Z87.19

## 2014-11-09 HISTORY — DX: Personal history of peptic ulcer disease: Z87.11

## 2014-11-09 HISTORY — DX: Personal history of other medical treatment: Z92.89

## 2014-11-09 HISTORY — DX: Gastro-esophageal reflux disease without esophagitis: K21.9

## 2014-11-09 HISTORY — DX: Headache, unspecified: R51.9

## 2014-11-09 HISTORY — DX: Calculus of kidney: N20.0

## 2014-11-09 HISTORY — DX: Headache: R51

## 2014-11-09 LAB — CBC
HCT: 39.8 % (ref 36.0–46.0)
Hemoglobin: 13.3 g/dL (ref 12.0–15.0)
MCH: 34.6 pg — ABNORMAL HIGH (ref 26.0–34.0)
MCHC: 33.4 g/dL (ref 30.0–36.0)
MCV: 103.6 fL — ABNORMAL HIGH (ref 78.0–100.0)
Platelets: 129 10*3/uL — ABNORMAL LOW (ref 150–400)
RBC: 3.84 MIL/uL — ABNORMAL LOW (ref 3.87–5.11)
RDW: 12.7 % (ref 11.5–15.5)
WBC: 3.6 10*3/uL — AB (ref 4.0–10.5)

## 2014-11-09 LAB — BASIC METABOLIC PANEL
ANION GAP: 8 (ref 5–15)
BUN: 10 mg/dL (ref 6–23)
CHLORIDE: 104 mmol/L (ref 96–112)
CO2: 27 mmol/L (ref 19–32)
CREATININE: 0.59 mg/dL (ref 0.50–1.10)
Calcium: 8.7 mg/dL (ref 8.4–10.5)
GFR calc Af Amer: >90 mL/min (ref >90)
GFR calc non Af Amer: >90 mL/min (ref >90)
Glucose, Bld: 103 mg/dL — ABNORMAL HIGH (ref 70–99)
Potassium: 4.1 mmol/L (ref 3.5–5.1)
Sodium: 139 mmol/L (ref 135–145)

## 2014-11-09 LAB — CBG MONITORING, ED: GLUCOSE-CAPILLARY: 104 mg/dL — AB (ref 70–99)

## 2014-11-09 LAB — RAPID URINE DRUG SCREEN, HOSP PERFORMED
Amphetamines: NOT DETECTED
BENZODIAZEPINES: NOT DETECTED
Barbiturates: NOT DETECTED
COCAINE: NOT DETECTED
Opiates: NOT DETECTED
Tetrahydrocannabinol: NOT DETECTED

## 2014-11-09 LAB — ETHANOL

## 2014-11-09 MED ORDER — VALPROATE SODIUM 500 MG/5ML IV SOLN
1000.0000 mg | Freq: Once | INTRAVENOUS | Status: AC
  Administered 2014-11-09: 1000 mg via INTRAVENOUS
  Filled 2014-11-09: qty 10

## 2014-11-09 MED ORDER — ALBUTEROL SULFATE (2.5 MG/3ML) 0.083% IN NEBU: 3.0000 mL | INHALATION_SOLUTION | Freq: Four times a day (QID) | RESPIRATORY_TRACT | Status: DC | PRN

## 2014-11-09 MED ORDER — LORAZEPAM 1 MG PO TABS: 1.0000 mg | ORAL_TABLET | Freq: Once | ORAL | Status: DC

## 2014-11-09 MED ORDER — ONDANSETRON HCL 4 MG PO TABS: 4.0000 mg | ORAL_TABLET | Freq: Four times a day (QID) | ORAL | Status: DC | PRN

## 2014-11-09 MED ORDER — LORAZEPAM 2 MG/ML IJ SOLN
1.0000 mg | Freq: Once | INTRAMUSCULAR | Status: AC
  Administered 2014-11-09: 1 mg via INTRAVENOUS
  Filled 2014-11-09: qty 1

## 2014-11-09 MED ORDER — HEPARIN SODIUM (PORCINE) 5000 UNIT/ML IJ SOLN
5000.0000 [IU] | Freq: Three times a day (TID) | INTRAMUSCULAR | Status: DC
  Administered 2014-11-10: 5000 [IU] via SUBCUTANEOUS
  Filled 2014-11-09 (×5): qty 1

## 2014-11-09 MED ORDER — LORAZEPAM 2 MG/ML IJ SOLN
INTRAMUSCULAR | Status: AC
  Administered 2014-11-09: 1 mg via INTRAVENOUS
  Filled 2014-11-09: qty 1

## 2014-11-09 MED ORDER — LORAZEPAM 2 MG/ML IJ SOLN
1.0000 mg | Freq: Once | INTRAMUSCULAR | Status: AC
  Administered 2014-11-09: 1 mg via INTRAVENOUS

## 2014-11-09 MED ORDER — ONDANSETRON HCL 4 MG/2ML IJ SOLN: 4.0000 mg | Freq: Four times a day (QID) | INTRAMUSCULAR | Status: DC | PRN

## 2014-11-09 MED ORDER — ALBUTEROL SULFATE HFA 108 (90 BASE) MCG/ACT IN AERS: 2.0000 | INHALATION_SPRAY | Freq: Four times a day (QID) | RESPIRATORY_TRACT | Status: DC | PRN

## 2014-11-09 MED ORDER — SODIUM CHLORIDE 0.9 % IV SOLN
75.0000 mL/h | INTRAVENOUS | Status: DC
  Administered 2014-11-09 – 2014-11-10 (×2): 75 mL/h via INTRAVENOUS

## 2014-11-09 MED ORDER — LACOSAMIDE 50 MG PO TABS
200.0000 mg | ORAL_TABLET | Freq: Two times a day (BID) | ORAL | Status: DC
  Administered 2014-11-10: 200 mg via ORAL
  Filled 2014-11-09: qty 4

## 2014-11-09 MED ORDER — LORAZEPAM 2 MG/ML IJ SOLN: 1.0000 mg | INTRAMUSCULAR | Status: DC | PRN

## 2014-11-09 MED ORDER — PHENYTOIN SODIUM EXTENDED 100 MG PO CAPS
100.0000 mg | ORAL_CAPSULE | Freq: Two times a day (BID) | ORAL | Status: DC
  Filled 2014-11-09 (×2): qty 2

## 2014-11-09 NOTE — ED Notes (Signed)
Called to give report. Nurse will call back 

## 2014-11-09 NOTE — Progress Notes (Signed)
New admit to room 5w08 from [facility] ER.  Arrived via stretcher with family at bedside.  Alert and oriented.  Made comfortable in bed.  Instructed on use of call system and placed within reach.  Seizure precautions in place.  Bed in low position.  Will continue to monitor.

## 2014-11-09 NOTE — ED Notes (Signed)
Pt has had 2 seizures today with fall. Pt has hx of seizures and is on dilantin for seizures. Seizures today were witnessed by mother today with full body shaking, pt was post ictal withEMS.

## 2014-11-09 NOTE — ED Provider Notes (Signed)
CSN: [id number]     Arrival date & time 11/09/14  1310 History   First MD Initiated Contact with Patient 11/09/14 1500     Chief Complaint  Patient presents with  . Seizures     (Consider location/radiation/quality/duration/timing/severity/associated sxs/prior Treatment) HPI [name] is a 59 y.o. female with history of seizures, taking Dilantin and Vimpat comes in for evaluation of seizure. Patient states this morning at 9:00 she took her normal dose of Dilantin (100 mg in the am and 200 mg in pm) and subsequently had a seizure at 9:30. This was a typical seizure and was normal for her. Seizure was tonic-clonic in nature, lasted approximately 1 minute. She denies any oral trauma, no loss of bowel or bladder function. She reports recently going to the epilepsy center at [facility] 2 weeks ago to follow-up with her neurologist. No medications were changed. She denies any fevers, recent infections, neck stiffness or pain, chest pain, shortness of breath, vision changes, headache. She is at baseline at this time according to herself and sister in the room. She attributes to recent seizures to increased stressors at home. Neurologist: Dr Margette Fast Past Medical History  Diagnosis Date  . Seizures   . DVT (deep venous thrombosis)   . Asthma   . Cerebral hemorrhage   . Chronic back pain   . DDD (degenerative disc disease) 06/12/2013   Past Surgical History  Procedure Laterality Date  . Cesarean section    . Brain surgery      craniotomy with hematoma evacuation   Family History  Problem Relation Age of Onset  . Heart attack Mother   . Heart attack Father    History  Substance Use Topics  . Smoking status: Current Every Day Smoker -- 0.50 packs/day for 20 years    Types: Cigarettes  . Smokeless tobacco: Never Used  . Alcohol Use: No   OB History    No data available     Review of Systems A 10 point review of systems was completed and was negative except for pertinent  positives and negatives as mentioned in the history of present illness     Allergies  Zonisamide; Codeine; Keppra; and Olive oil  Home Medications   Prior to Admission medications   Medication Sig Start Date End Date Taking? Authorizing Provider  lacosamide (VIMPAT) 200 MG TABS tablet Take 200 mg by mouth 2 (two) times daily.   Yes Historical Provider, MD  phenytoin (DILANTIN) 100 MG ER capsule Take 100-200 mg by mouth 2 (two) times daily. Take 1 capsule (100 mg) every morning and 2 capsules (200 mg) every night   Yes Historical Provider, MD  pregabalin (LYRICA) 25 MG capsule Take 25 mg by mouth daily. 10/13/14  Yes Historical Provider, MD  VENTOLIN HFA 108 (90 BASE) MCG/ACT inhaler Inhale 2 puffs into the lungs 4 (four) times daily as needed. 10/17/14  Yes Historical Provider, MD   BP 103/83 mmHg  Pulse 74  Temp(Src) 98.4 F (36.9 C)  Resp 18  SpO2 93% Physical Exam  Constitutional: She is oriented to person, place, and time. She appears well-developed and well-nourished.  HENT:  Head: Normocephalic and atraumatic.  Mouth/Throat: Oropharynx is clear and moist.  Eyes: Conjunctivae are normal. Pupils are equal, round, and reactive to light. Right eye exhibits no discharge. Left eye exhibits no discharge. No scleral icterus.  Neck: Normal range of motion. Neck supple.  No meningismus or nuchal rigidity.  Cardiovascular: Normal rate, regular rhythm and normal  heart sounds.   Pulmonary/Chest: Effort normal and breath sounds normal. No respiratory distress. She has no wheezes. She has no rales.  Abdominal: Soft. There is no tenderness.  Musculoskeletal: She exhibits no tenderness.  Neurological: She is alert and oriented to person, place, and time.  Cranial Nerves II-XII grossly intact. Motor and sensation intact and equal bilaterally. Grip strength intact and equal bilaterally. Speech is appropriate, thought content normal. Completes finger to nose or nasal movements without difficulty.   Skin: Skin is warm and dry. No rash noted.  Psychiatric: She has a normal mood and affect.  Nursing note and vitals reviewed.   ED Course  Procedures (including critical care time) Labs Review Labs Reviewed  CBC - Abnormal; Notable for the following:    WBC 3.6 (*)    RBC 3.84 (*)    MCV 103.6 (*)    MCH 34.6 (*)    Platelets 129 (*)    All other components within normal limits  BASIC METABOLIC PANEL - Abnormal; Notable for the following:    Glucose, Bld 103 (*)    All other components within normal limits  CBG MONITORING, ED - Abnormal; Notable for the following:    Glucose-Capillary 104 (*)    All other components within normal limits  PHENYTOIN LEVEL, TOTAL    Imaging Review No results found.   EKG Interpretation None     Meds given in ED:  Medications - No data to display  New Prescriptions   No medications on file   Filed Vitals:   11/09/14 1317 11/09/14 1319  BP:  103/83  Pulse:  74  Temp:  98.4 F (36.9 C)  Resp:  18  SpO2: 95% 93%    MDM  Vitals stable - WNL -afebrile Pt resting comfortably in ED. PE--patient remains neurovascularly intact with no focal findings. No status epilepticus Labwork--pending Dilantin level. Labs otherwise noncontributory.  Patient on 2 anti seizure medications. Has historically been therapeutic on Dilantin and is maxed out on Vimpat. Discussed with neurology, Dr. Nicole Kindred, recommends initiation of third antiseizure medication. Will initiate 1 g of Depakote in ED with subsequent 500 mg dose twice a day. Discussed with patient and necessitate follow-up with neurology.  6:44 PM: Patient's experiences a second grand mal seizure while receiving depakote in ED. Pt received approximately 1/2 of her 1g dose and proceeded to have 2-63min seizure. Pt given 1mg  Ativan and experienced resolution of seizure.  Pt not post ictal No identifiable source for seizure in ED.  Consult to neurology, Dr. Janann Colonel will see patient at [facility] for  refractory seizure. Will admit to hospitalist and initiate transfer. Requests CT head, pending. Consult to hospitalist, Dr. Ernestina Patches to see in ED. Pt admitted.  Prior to pt admission, I discussed and reviewed pt case with attending, Dr. Darl Householder who also saw and evaluated the pt.   Final diagnoses:  Seizure        Comer Locket, PA-C 11/10/14 [place] Yao, MD 11/10/14 [phone]

## 2014-11-09 NOTE — ED Notes (Signed)
Bed: WA07 Expected date:  Expected time:  Means of arrival:  Comments: EMS- Seizure?

## 2014-11-09 NOTE — Progress Notes (Signed)
MEDICATION RELATED CONSULT NOTE - INITIAL   Pharmacy consult: Monitor anti-epileptic meds Indication: admit for uncontrolled seizures  Allergies  Allergen Reactions  . Zonisamide Other (See Comments)    Numbness and tingling over whole body  . Codeine Nausea And Vomiting  . Keppra [Levetiracetam] Other (See Comments)    Causes seizures.   [name] Oil Rash   Patient Measurements:   Adjusted Body Weight:   Vital Signs: Temp: 101.2 F (38.4 C) (04/07 2139) Temp Source: Rectal (04/07 2139) BP: 114/56 mmHg (04/07 2138) Pulse Rate: 88 (04/07 2138) Intake/Output from previous day:   Intake/Output from this shift:    Labs:  Recent Labs  11/09/14 1348  WBC 3.6*  HGB 13.3  HCT 39.8  PLT 129*  CREATININE 0.59   CrCl cannot be calculated (Unknown ideal weight.).  Microbiology: No results found for this or any previous visit (from the past 720 hour(s)).  Medical History: Past Medical History  Diagnosis Date  . Seizures   . DVT (deep venous thrombosis)   . Asthma   . Cerebral hemorrhage   . Chronic back pain   . DDD (degenerative disc disease) 06/12/2013    Medications:  Scheduled:  . heparin  5,000 Units Subcutaneous 3 times per day  . lacosamide  200 mg Oral BID  . phenytoin  100-200 mg Oral BID   Infusions:  . sodium chloride 75 mL/hr (11/09/14 2106)   Assessment: To ED for 2 witnessed seizures at home, 1 seizure in ED. Compliant with Phenytoin & Vimpat, last dose this am 4/7.  Marland Kitchen Continued Vimpat and Phenytoin po, loaded with Depakene IV, required Ativan to control seizures  Possible interactions:   Valproate can decrease Phenytoin levels: could be an issue if Valproate continued  Phenytoin can decrease Valproate levels: again only an issue if Valproate continued  Phenytoin can decrease Vimpat levels: patient on both PTA  Plan:   Monitor anti-seizure meds, renal function  Phenytoin level in process  Gladys, Deckard PharmD Pager [phone] 11/09/2014,  10:38 PM

## 2014-11-09 NOTE — Consult Note (Signed)
Consult Reason for Consult:breaktrhough seizures Referring Physician: Dr Tonie Griffith ED  CC: breakthrough seizures  HPI: [name] is an 59 y.o. female past medical history of seizure s/p ICH,  presenting with recurrent seizure. Per son, pt w/ 2 witnessed grand mal seizures at home. Pt was noted to be post ictal by EMS. Pt presented to ER where pt had another witnessed seizure. She was then loaded with depakote 1000mg . . Pt then had another witnessed seizure. Seizures finally broken s/p IV ativan. Per son, pt has had 2-3 seizures at home over the last week. Reports compliance w/ her baseline antiseizure medications vimpat 200mg  BID and Dilantin 300mg  daily. Reports pt has been under a lot of stress at home which is her usual trigger. No reported recent infections. No illicit drug use or alcohol.   CT head imaging reviewed, no acute process. Lab workup unremarkable. Dilantin level pending.   EEG from [place] shows Interictal EEG: 1. Abundant right posterior temporal spikes and sharp waves, maximal at T6/P10  2. Frequent anterior right temporal spikes and sharps maximal at F8/F10  3. Occasional left anterior temporal spikes and sharp waves, maximal at [facility] F7/T3  4. Near continuous slowing in the right posterior hemisphere  Past Medical History  Diagnosis Date  . Seizures   . DVT (deep venous thrombosis)   . Asthma   . Cerebral hemorrhage   . Chronic back pain   . DDD (degenerative disc disease) 06/12/2013    Past Surgical History  Procedure Laterality Date  . Cesarean section    . Brain surgery      craniotomy with hematoma evacuation    Family History  Problem Relation Age of Onset  . Heart attack Mother   . Heart attack Father     Social History:  reports that she has been smoking Cigarettes.  She has a 10 pack-year smoking history. She has never used smokeless tobacco. She reports that she does not drink alcohol or use illicit drugs.  Allergies  Allergen Reactions  .  Zonisamide Other (See Comments)    Numbness and tingling over whole body  . Codeine Nausea And Vomiting  . Keppra [Levetiracetam] Other (See Comments)    Causes seizures.   [name] Oil Rash    Medications:  Scheduled: . heparin  5,000 Units Subcutaneous 3 times per day  . lacosamide  200 mg Oral BID  . phenytoin  100-200 mg Oral BID     ROS: Out of a complete 14 system review, the patient complains of only the following symptoms, and all other reviewed systems are negative.  Physical Examination: Filed Vitals:   11/09/14 2139  BP:   Pulse:   Temp: 101.2 F (38.4 C)  Resp:    Physical Exam  Constitutional: He appears well-developed and well-nourished.  Psych: Affect appropriate to situation Eyes: No scleral injection HENT: No OP obstrucion Head: Normocephalic.  Cardiovascular: Normal rate and regular rhythm.  Respiratory: Effort normal and breath sounds normal.  GI: Soft. Bowel sounds are normal. No distension. There is no tenderness.  Skin: WDI  Neurologic Examination Mental Status: Lethargic, aroused with voice and light noxious stimuli. Oriented to name and "hospital". Quickly falls back asleep. No apahsia noted.  Cranial Nerves: II: optic discs not visualized; Visual fields grossly normal, pupils equal, round, reactive to light and accommodation III,IV, VI: ptosis not present, extra-ocular motions intact bilaterally V,VII: smile symmetric, facial light touch sensation normal bilaterally VIII: hearing normal bilaterally IX,X: gag reflex present XI:  bilateral shoulder shrug XII: midline tongue extension without atrophy or fasciculations Motor: Right :Upper extremity 5/5Left: Upper extremity 5/5 Lower extremity 5/5Lower extremity 5/5 Tone and bulk:normal tone throughout; no atrophy noted Sensory: Pinprick and light touch intact throughout,  bilaterally Deep Tendon Reflexes:  2+ and symmetric reflexes throughout  Plantars: Right: downgoingLeft: downgoing Coordination: Unable to test due to lethargy Gait:  No tested Laboratory Studies:   Basic Metabolic Panel:  Recent Labs Lab 11/09/14 1348  NA 139  K 4.1  CL 104  CO2 27  GLUCOSE 103*  BUN 10  CREATININE 0.59  CALCIUM 8.7    Liver Function Tests: No results for input(s): AST, ALT, ALKPHOS, BILITOT, PROT, ALBUMIN in the last 168 hours. No results for input(s): LIPASE, AMYLASE in the last 168 hours. No results for input(s): AMMONIA in the last 168 hours.  CBC:  Recent Labs Lab 11/09/14 1348  WBC 3.6*  HGB 13.3  HCT 39.8  MCV 103.6*  PLT 129*    Cardiac Enzymes: No results for input(s): CKTOTAL, CKMB, CKMBINDEX, TROPONINI in the last 168 hours.  BNP: Invalid input(s): POCBNP  CBG:  Recent Labs Lab 11/09/14 1356  GLUCAP 104*    Microbiology: Results for orders placed or performed during the hospital encounter of 06/11/13  MRSA PCR Screening     Status: None   Collection Time: 06/11/13 10:00 PM  Result Value Ref Range Status   MRSA by PCR NEGATIVE NEGATIVE Final    Comment:        The GeneXpert MRSA Assay (FDA approved for NASAL specimens only), is one component of a comprehensive MRSA colonization surveillance program. It is not intended to diagnose MRSA infection nor to guide or monitor treatment for MRSA infections.    Coagulation Studies: No results for input(s): LABPROT, INR in the last 72 hours.  Urinalysis: No results for input(s): COLORURINE, LABSPEC, PHURINE, GLUCOSEU, HGBUR, BILIRUBINUR, KETONESUR, PROTEINUR, UROBILINOGEN, NITRITE, LEUKOCYTESUR in the last 168 hours.  Invalid input(s): APPERANCEUR  Lipid Panel:  No results found for: CHOL, TRIG, HDL, CHOLHDL, VLDL, LDLCALC  HgbA1C:  Lab Results  Component Value Date   HGBA1C 5.2 06/11/2013    Urine Drug Screen:     Component  Value Date/Time   LABOPIA NONE DETECTED 11/09/2014 2109   COCAINSCRNUR NONE DETECTED 11/09/2014 2109   LABBENZ NONE DETECTED 11/09/2014 2109   AMPHETMU NONE DETECTED 11/09/2014 2109   THCU NONE DETECTED 11/09/2014 2109   LABBARB NONE DETECTED 11/09/2014 2109    Alcohol Level:  Recent Labs Lab 11/09/14 2126  ETH <5    Other results:  Imaging: Ct Head Wo Contrast  11/09/2014   CLINICAL DATA:  Two seizures today with fall. Previous history of seizures on Dilantin.  EXAM: CT HEAD WITHOUT CONTRAST  TECHNIQUE: Contiguous axial images were obtained from the base of the skull through the vertex without intravenous contrast.  COMPARISON:  05/14/2014  FINDINGS: Postoperative changes with right posterior parietal craniotomy. Mild depression of the anterior edge of the bone flap with respect to the remainder the skull. Appearance is unchanged since prior study. Underlying encephalomalacia in the right posterior parietal and occipital region probably postoperative. Associated dilatation of the posterior horn of the right lateral ventricle. Mild diffuse cerebral atrophy. No significant white matter changes. No abnormal extra-axial fluid collections. Gray-white matter junctions are distinct. Basal cisterns are not effaced. No acute intracranial hemorrhage. Visualized paranasal sinuses appear patent. Opacification of some of the left mastoid air cells.  IMPRESSION: No acute intracranial abnormality. Large encephalomalacia  in the right posterior parietal/ occipital region with associated craniotomy.   Electronically Signed   By: Lucienne Capers M.D.   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 Currently lethargic but able to interact appropriately when stimulated.   She was loaded with VPA in the ED but per review of [facility] notes she has tried VPA in the past with no benefit.  Therefore will hold on starting standing dose of VPA. Per WF she has tried Pb, VPA, zonisamide, onfi, tegretol, felbatol, topamax, tigabine, neuronti, lyrica all without benefit or unable to tolerate due to adverse effects.   -continue Dilantin and Vimpat at current dose and schedule -awaiting dilantin level -if dilantin level within therapeutic range would consider addition of trileptal -will follow up  Jim Like, DO [facility] [phone]  If 7pm- 7am, please page neurology on call as listed in AMION. 11/09/2014, 10:45 PM

## 2014-11-09 NOTE — H&P (Signed)
Hospitalist Admission History and Physical  Patient name: [name] Medical record number: [id number] Date of birth: 30-Nov-1955 Age: 59 y.o. Gender: female  Primary Care Provider: [name], MD  Chief Complaint: recurrent seizure  History of Present Illness:This is a 59 y.o. year old female with significant past medical history of seizure s/p ICH, tobacco abuse, asthma  presenting with recurrent seizure. Level V caveat as pt somnolent s/p ativan. History primarily from son. Per report, pt w/ 2 witnessed grand mal seizures at home. Pt was noted to be post ictal by EMS. Pt presented to ER where pt had another witnessed seizure. Case was discussed w/ on call neurology, with recommendation for loading pt w/ depakote. Pt then had another witnessed seizure. Seizures finally broken s/p IV ativan.  Per son, pt has had 2-3 seizures at home over the last week. Reports compliance w/ her baseline antiseizure medications vimpat and dilantin. Reports pt was placed on new seizure medication at [facility] approx 1 month ago. However pt stopped taking after 2-3 weeks because of side effects includig leg numbness and ?rash. Reports pt has been under a lot of stress at home which is her usual trigger. No reported recent infections. No illicit drug use or alcohol.  Presented to ER afebrile, hemodynamically. Case discussed w/ on call neurology per EDPA. Recommendations are for admission to [facility] w/ formal neuro consult in am. Dilantin level pending.   Assessment and Plan: [name] is a 59 y.o. year old female presenting with recurrent seizure    Active Problems:   Recurrent seizures   1-Recurrent Seizure -breakthrough seizure despite baseline anti-seizure medication -? Stress trigger (usual preciptant per report) -resolved s/p IV ativan -s/p Depakote load -dilantin level pending -cont home vimpat and dilantin -f/u neuro recs   2-Asthma -stable resp status currently  -cont albuterol  -quit  smoking   FEN/GI: NPO for now. Noted witnessed episode of emesis s/p ativan. Antiemetics follow. CXR if pt spikes fever.  Prophylaxis: sub q heparin  Disposition: pending further evaluation  Code Status:Full Code    Patient Active Problem List   Diagnosis Date Noted  . Asthma 05/15/2014  . Seizure 05/15/2014  . Protein calorie malnutrition 05/15/2014  . DVT of leg (deep venous thrombosis) 06/19/2013  . DDD (degenerative disc disease) 06/12/2013  . Paresthesias 06/12/2013  . Tremor 06/09/2013  . Thrombocytopenia- chronic 06/29/2012  . Noncompliance with medication treatment due to underuse of medication 06/29/2012  . Stress-personal 06/29/2012  . Recurrent seizures 06/29/2012  . Tobacco dependence 06/28/2012  . Skull fracture 02/28/2012  . Dilantin toxicity 02/28/2012  . Seizure disorder 02/28/2012  . History of Intracranial bleed 02/28/2012   Past Medical History: Past Medical History  Diagnosis Date  . Seizures   . DVT (deep venous thrombosis)   . Asthma   . Cerebral hemorrhage   . Chronic back pain   . DDD (degenerative disc disease) 06/12/2013    Past Surgical History: Past Surgical History  Procedure Laterality Date  . Cesarean section    . Brain surgery      craniotomy with hematoma evacuation    Social History: History   Social History  . Marital Status: Divorced    Spouse Name: N/A  . Number of Children: N/A  . Years of Education: N/A   Social History Main Topics  . Smoking status: Current Every Day Smoker -- 0.50 packs/day for 20 years    Types: Cigarettes  . Smokeless tobacco: Never Used  . Alcohol  Use: No  . Drug Use: No  . Sexual Activity: No   Other Topics Concern  . None   Social History Narrative    Family History: Family History  Problem Relation Age of Onset  . Heart attack Mother   . Heart attack Father     Allergies: Allergies  Allergen Reactions  . Zonisamide Other (See Comments)    Numbness and tingling over whole body   . Codeine Nausea And Vomiting  . Keppra [Levetiracetam] Other (See Comments)    Causes seizures.   [name] Oil Rash    Current Facility-Administered Medications  Medication Dose Route Frequency Provider Last Rate Last Dose  . 0.9 %  sodium chloride infusion  75 mL/hr Intravenous Continuous Deneise Lever, MD      . albuterol (PROVENTIL HFA;VENTOLIN HFA) 108 (90 BASE) MCG/ACT inhaler 2 puff  2 puff Inhalation QID PRN Deneise Lever, MD      . heparin injection 5,000 Units  5,000 Units Subcutaneous 3 times per day Deneise Lever, MD      . lacosamide (VIMPAT) tablet 200 mg  200 mg Oral BID Deneise Lever, MD      . LORazepam (ATIVAN) injection 1 mg  1 mg Intravenous Once [employer], PA-C      . LORazepam (ATIVAN) injection 1-2 mg  1-2 mg Intravenous Q2H PRN Deneise Lever, MD      . ondansetron [facility]) tablet 4 mg  4 mg Oral Q6H PRN Deneise Lever, MD       Or  . ondansetron [facility]) injection 4 mg  4 mg Intravenous Q6H PRN Deneise Lever, MD      . phenytoin (DILANTIN) ER capsule 100-200 mg  100-200 mg Oral BID Deneise Lever, MD       Current Outpatient Prescriptions  Medication Sig Dispense Refill  . lacosamide (VIMPAT) 200 MG TABS tablet Take 200 mg by mouth 2 (two) times daily.    . phenytoin (DILANTIN) 100 MG ER capsule Take 100-200 mg by mouth 2 (two) times daily. Take 1 capsule (100 mg) every morning and 2 capsules (200 mg) every night    . pregabalin (LYRICA) 25 MG capsule Take 25 mg by mouth daily.    . VENTOLIN HFA 108 (90 BASE) MCG/ACT inhaler Inhale 2 puffs into the lungs 4 (four) times daily as needed.  0   Review Of Systems: 12 point ROS negative except as noted above in HPI.  Physical Exam: Filed Vitals:   11/09/14 1952  BP: 115/54  Pulse: 72  Temp: 99.2 F (37.3 C)  Resp: 23    General: cooperative and somnolent  HEENT: PERRLA and extra ocular movement intact Heart: S1, S2 normal, no murmur, rub or gallop, regular rate and rhythm Lungs: clear  to auscultation, no wheezes or rales and unlabored breathing Abdomen: abdomen is soft without significant tenderness, masses, organomegaly or guarding Extremities: extremities normal, atraumatic, no cyanosis or edema Skin:no rashes Neurology: somnolent, oriented, otherwise grossly normal exam   Labs and Imaging: Lab Results  Component Value Date/Time   NA 139 11/09/2014 01:48 PM   NA 145* 06/10/2013 08:12 AM   K 4.1 11/09/2014 01:48 PM   CL 104 11/09/2014 01:48 PM   CO2 27 11/09/2014 01:48 PM   BUN 10 11/09/2014 01:48 PM   BUN 13 06/10/2013 08:12 AM   CREATININE 0.59 11/09/2014 01:48 PM   GLUCOSE 103* 11/09/2014 01:48 PM   GLUCOSE 74 06/10/2013 08:12 AM  Lab Results  Component Value Date   WBC 3.6* 11/09/2014   HGB 13.3 11/09/2014   HCT 39.8 11/09/2014   MCV 103.6* 11/09/2014   PLT 129* 11/09/2014    No results found.         Shanda Howells MD  Pager: [phone]

## 2014-11-10 DIAGNOSIS — K219 Gastro-esophageal reflux disease without esophagitis: Secondary | ICD-10-CM | POA: Diagnosis not present

## 2014-11-10 DIAGNOSIS — G4089 Other seizures: Secondary | ICD-10-CM | POA: Diagnosis not present

## 2014-11-10 DIAGNOSIS — E46 Unspecified protein-calorie malnutrition: Secondary | ICD-10-CM | POA: Diagnosis not present

## 2014-11-10 DIAGNOSIS — D696 Thrombocytopenia, unspecified: Secondary | ICD-10-CM | POA: Diagnosis not present

## 2014-11-10 DIAGNOSIS — Z8673 Personal history of transient ischemic attack (TIA), and cerebral infarction without residual deficits: Secondary | ICD-10-CM | POA: Diagnosis not present

## 2014-11-10 DIAGNOSIS — J45909 Unspecified asthma, uncomplicated: Secondary | ICD-10-CM | POA: Diagnosis not present

## 2014-11-10 DIAGNOSIS — G8929 Other chronic pain: Secondary | ICD-10-CM | POA: Diagnosis not present

## 2014-11-10 DIAGNOSIS — F17210 Nicotine dependence, cigarettes, uncomplicated: Secondary | ICD-10-CM | POA: Diagnosis not present

## 2014-11-10 DIAGNOSIS — Z885 Allergy status to narcotic agent status: Secondary | ICD-10-CM | POA: Diagnosis not present

## 2014-11-10 DIAGNOSIS — G40909 Epilepsy, unspecified, not intractable, without status epilepticus: Secondary | ICD-10-CM | POA: Diagnosis not present

## 2014-11-10 LAB — PHENYTOIN LEVEL, TOTAL: PHENYTOIN LVL: 13.3 ug/mL (ref 10.0–20.0)

## 2014-11-10 LAB — ALBUMIN: Albumin: 3.4 g/dL — ABNORMAL LOW (ref 3.5–5.2)

## 2014-11-10 MED ORDER — PHENYTOIN SODIUM EXTENDED 100 MG PO CAPS
200.0000 mg | ORAL_CAPSULE | Freq: Every day | ORAL | Status: DC
  Administered 2014-11-10: 200 mg via ORAL
  Filled 2014-11-10: qty 2

## 2014-11-10 MED ORDER — PHENYTOIN SODIUM EXTENDED 100 MG PO CAPS
100.0000 mg | ORAL_CAPSULE | Freq: Every day | ORAL | Status: DC
  Filled 2014-11-10: qty 1

## 2014-11-10 MED ORDER — PHENYTOIN SODIUM EXTENDED 30 MG PO CAPS: 100.0000 mg | ORAL_CAPSULE | Freq: Two times a day (BID) | ORAL | Status: DC

## 2014-11-10 MED ORDER — LACOSAMIDE 50 MG PO TABS
200.0000 mg | ORAL_TABLET | Freq: Two times a day (BID) | ORAL | Status: DC
  Administered 2014-11-10: 200 mg via ORAL
  Filled 2014-11-10: qty 4

## 2014-11-10 MED ORDER — PHENYTOIN SODIUM EXTENDED 100 MG PO CAPS
100.0000 mg | ORAL_CAPSULE | Freq: Every day | ORAL | Status: DC
  Administered 2014-11-10: 100 mg via ORAL
  Filled 2014-11-10: qty 1

## 2014-11-10 MED ORDER — PHENYTOIN SODIUM EXTENDED 30 MG PO CAPS: ORAL_CAPSULE | ORAL | Status: DC

## 2014-11-10 NOTE — Progress Notes (Signed)
NURSING PROGRESS NOTE  [name] [id number] Discharge Data: 11/10/2014 5:08 PM Attending Provider: Jonetta Osgood, MD PCP:KIM, Jeneen Rinks, MD     Ander Gaster to be D/C'd Home per MD order.  Discussed with the patient the After Visit Summary and all questions fully answered. All IV's discontinued with no bleeding noted. All belongings returned to patient for patient to take home.   Last Vital Signs:  Blood pressure 94/54, pulse 90, temperature 99.4 F (37.4 C), temperature source Oral, resp. rate 18, height 5\' 7"  (1.702 m), weight 50.1 kg (110 lb 7.2 oz), SpO2 95 %.  Discharge Medication List   Medication List    TAKE these medications        lacosamide 200 MG Tabs tablet  Commonly known as:  VIMPAT  Take 200 mg by mouth 2 (two) times daily.     phenytoin 30 MG ER capsule  Commonly known as:  DILANTIN  Take  130 mg every morning and 2 capsules (200 mg) every night     pregabalin 25 MG capsule  Commonly known as:  LYRICA  Take 25 mg by mouth daily.     VENTOLIN HFA 108 (90 BASE) MCG/ACT inhaler  Generic drug:  albuterol  Inhale 2 puffs into the lungs 4 (four) times daily as needed.

## 2014-11-10 NOTE — Discharge Summary (Signed)
PATIENT DETAILS Name: [name] Age: 59 y.o. Sex: female Date of Birth: Nov 24, 1955 MRN: [id number]. Admitting Physician: Deneise Lever, MD Raymond Gurney, MD  Admit Date: 11/09/2014 Discharge date: 11/10/2014  Recommendations for Outpatient Follow-up:  1. Dilantin dose changed, please ensure follow up with primary neurologists.   PRIMARY DISCHARGE DIAGNOSIS:  Active Problems:   Recurrent seizures      PAST MEDICAL HISTORY: Past Medical History  Diagnosis Date  . DVT (deep venous thrombosis)     "she's had several since 1990"  . Asthma   . Cerebral hemorrhage 1989  . Chronic back pain   . DDD (degenerative disc disease) 06/12/2013  . Kidney stones   . Chronic bronchitis     "gets it q yr"  . History of blood transfusion     "when she was a baby"  . History of stomach ulcers   . GERD (gastroesophageal reflux disease)   . Headache     "usually around time when she's had a seizure"  . Seizures     "because of her brain surgery"  . Stroke 1990's    family denies residual on 11/09/2014  . Tunnel vision     "since brain OR"    DISCHARGE MEDICATIONS: Current Discharge Medication List    CONTINUE these medications which have CHANGED   Details  phenytoin (DILANTIN) 30 MG ER capsule Take  130 mg every morning and 2 capsules (200 mg) every night Qty: 60 capsule, Refills: 0      CONTINUE these medications which have NOT CHANGED   Details  lacosamide (VIMPAT) 200 MG TABS tablet Take 200 mg by mouth 2 (two) times daily.    pregabalin (LYRICA) 25 MG capsule Take 25 mg by mouth daily.    VENTOLIN HFA 108 (90 BASE) MCG/ACT inhaler Inhale 2 puffs into the lungs 4 (four) times daily as needed. Refills: 0        ALLERGIES:   Allergies  Allergen Reactions  . Zonisamide Other (See Comments)    Numbness and tingling over whole body  . Codeine Nausea And Vomiting  . Keppra [Levetiracetam] Other (See Comments)    Causes seizures.   [name] Oil Rash    BRIEF  HPI:  See H&P, Labs, Consult and Test reports for all details in brief, patientis a 59 y.o. year old female with significant past medical history of seizure s/p ICH, tobacco abuse, asthma presenting with recurrent seizure. Patient was admitted for further evaluation and treatment.    CONSULTATIONS:   neurology  PERTINENT RADIOLOGIC STUDIES: Ct Head Wo Contrast  11/09/2014   CLINICAL DATA:  Two seizures today with fall. Previous history of seizures on Dilantin.  EXAM: CT HEAD WITHOUT CONTRAST  TECHNIQUE: Contiguous axial images were obtained from the base of the skull through the vertex without intravenous contrast.  COMPARISON:  05/14/2014  FINDINGS: Postoperative changes with right posterior parietal craniotomy. Mild depression of the anterior edge of the bone flap with respect to the remainder the skull. Appearance is unchanged since prior study. Underlying encephalomalacia in the right posterior parietal and occipital region probably postoperative. Associated dilatation of the posterior horn of the right lateral ventricle. Mild diffuse cerebral atrophy. No significant white matter changes. No abnormal extra-axial fluid collections. Gray-white matter junctions are distinct. Basal cisterns are not effaced. No acute intracranial hemorrhage. Visualized paranasal sinuses appear patent. Opacification of some of the left mastoid air cells.  IMPRESSION: No acute intracranial abnormality. Large encephalomalacia in the right posterior parietal/  occipital region with associated craniotomy.   Electronically Signed   By: Lucienne Capers M.D.   On: 11/09/2014 22:34   US Venous Img Lower Unilateral Right  10/18/2014   CLINICAL DATA:  Swelling, pain, edema. Numbness in feet. Previous left DVT. Previous tobacco abuse.  EXAM: RIGHT LOWER EXTREMITY VENOUS DOPPLER ULTRASOUND  TECHNIQUE: Gray-scale sonography with compression, as well as color and duplex ultrasound, were performed to evaluate the deep venous system from  the level of the common femoral vein through the popliteal and proximal calf veins.  COMPARISON:  None  FINDINGS: Normal compressibility of the common femoral, superficial femoral, and popliteal veins, as well as the proximal calf veins. No filling defects to suggest DVT on grayscale or color Doppler imaging. Doppler waveforms show normal direction of venous flow, normal respiratory phasicity and response to augmentation. Visualized segments of the saphenous venous system normal in caliber and compressibility. Survey views of the contralateral common femoral vein are unremarkable.  IMPRESSION: 1. No evidence of lower extremity deep vein thrombosis, RIGHT.   Electronically Signed   By: Lucrezia Europe M.D.   On: 10/18/2014 14:09     PERTINENT LAB RESULTS: CBC:  Recent Labs  11/09/14 1348  WBC 3.6*  HGB 13.3  HCT 39.8  PLT 129*   CMET CMP     Component Value Date/Time   NA 139 11/09/2014 1348   NA 145* 06/10/2013 0812   K 4.1 11/09/2014 1348   CL 104 11/09/2014 1348   CO2 27 11/09/2014 1348   GLUCOSE 103* 11/09/2014 1348   GLUCOSE 74 06/10/2013 0812   BUN 10 11/09/2014 1348   BUN 13 06/10/2013 0812   CREATININE 0.59 11/09/2014 1348   CALCIUM 8.7 11/09/2014 1348   PROT 6.5 06/17/2013 1538   PROT 6.1 06/10/2013 0812   ALBUMIN 3.4* 11/10/2014 1129   AST 20 06/17/2013 1538   ALT 15 06/17/2013 1538   ALKPHOS 63 06/17/2013 1538   BILITOT 0.3 06/17/2013 1538   GFRNONAA >90 11/09/2014 1348   GFRAA >90 11/09/2014 1348    GFR Estimated Creatinine Clearance: 62.1 mL/min (by C-G formula based on Cr of 0.59). No results for input(s): LIPASE, AMYLASE in the last 72 hours. No results for input(s): CKTOTAL, CKMB, CKMBINDEX, TROPONINI in the last 72 hours. Invalid input(s): POCBNP No results for input(s): DDIMER in the last 72 hours. No results for input(s): HGBA1C in the last 72 hours. No results for input(s): CHOL, HDL, LDLCALC, TRIG, CHOLHDL, LDLDIRECT in the last 72 hours. No results for  input(s): TSH, T4TOTAL, T3FREE, THYROIDAB in the last 72 hours.  Invalid input(s): FREET3 No results for input(s): VITAMINB12, FOLATE, FERRITIN, TIBC, IRON, RETICCTPCT in the last 72 hours. Coags: No results for input(s): INR in the last 72 hours.  Invalid input(s): PT Microbiology: No results found for this or any previous visit (from the past 240 hour(s)).   BRIEF HOSPITAL COURSE:   Active Problems:   Recurrent seizures:Patient was admitted and seen by neurology due to recurrent seizures. CT head was negative for any acute abnormalities. The Dilantin dose was changed to 130 mg in the mornings and 200 mg at night.       Rest of the issues medical issues were stable during this short hospital stay  TODAY-DAY OF DISCHARGE:  Subjective:   Milany Garbett today has no headache,no chest abdominal pain,no new weakness tingling or numbness, feels much better wants to go home today.   Objective:   Blood pressure 105/57, pulse 90, temperature  99.6 F (37.6 C), temperature source Oral, resp. rate 22, height 5\' 7"  (1.702 m), weight 50.1 kg (110 lb 7.2 oz), SpO2 98 %.  Intake/Output Summary (Last 24 hours) at 11/10/14 1436 Last data filed at 11/10/14 1210  Gross per 24 hour  Intake 1412.5 ml  Output   2100 ml  Net -687.5 ml   Filed Weights   11/09/14 2256  Weight: 50.1 kg (110 lb 7.2 oz)    Exam Awake Alert, Oriented *3, No new F.N deficits, Normal affect [place].AT,PERRAL Supple Neck,No JVD, No cervical lymphadenopathy appriciated.  Symmetrical Chest wall movement, Good air movement bilaterally, CTAB RRR,No Gallops,Rubs or new Murmurs, No Parasternal Heave +ve B.Sounds, Abd Soft, Non tender, No organomegaly appriciated, No rebound -guarding or rigidity. No Cyanosis, Clubbing or edema, No new Rash or bruise  DISCHARGE CONDITION: Stable  DISPOSITION: Home  DISCHARGE INSTRUCTIONS:    Activity:  As tolerated   Diet recommendation: Regular Diet   Discharge Instructions     Call MD for:    Complete by:  As directed   If recurrent seizures     Diet - low sodium heart healthy    Complete by:  As directed      Discharge instructions    Complete by:  As directed   No driving, no participating in activities at heights, or any high speed water sports.     Increase activity slowly    Complete by:  As directed            Follow-up Information    Follow up with [name], MD. Schedule an appointment as soon as possible for a visit in 1 week.   Specialty:  Internal Medicine   Contact information:   [address] [place] [address] [phone]       Please follow up.   Contact information:   primary neurologists at [facility]-1 week      Total Time spent on discharge equals 25 minutes.  Signed: Wyatt Portela PA-S [facility]  11/10/2014 2:36 PM  Attending Patient was seen, examined,treatment plan was discussed with PA-S.  I have directly reviewed the clinical findings, lab, imaging studies and management of this patient in detail. I have made the necessary changes to the above noted documentation, and agree with the documentation.   In short, patient with known history of seizure disorder following intracranial hemorrhage in 1988, admitted with breakthrough seizure. CT of the head was negative. Patient was seen in consult by neurology, recommendations are to increase a.m. Dilantin dose 230 mg, continue p.m. Dilantin at 200 mg. Stable for discharge today per neurology. Patient instructed to follow-up with her primary neurologist at [facility] in the next 1-2 weeks.  Nena Alexander MD [facility].

## 2014-11-10 NOTE — Progress Notes (Signed)
Subjective: No further seizures.   Exam: Filed Vitals:   11/10/14 0527  BP: 105/57  Pulse: 90  Temp: 99.6 F (37.6 C)  Resp: 22    HEENT-  Normocephalic, no lesions, without obvious abnormality.  Normal external eye and conjunctiva.  Normal TM's bilaterally.  Normal auditory canals and external ears. Normal external nose, mucus membranes and septum.  Normal pharynx. Cardiovascular- S1, S2 normal, pulses palpable throughout   Lungs- chest clear, no wheezing, rales, normal symmetric air entry Abdomen- normal findings: bowel sounds normal Extremities- no edema Lymph-no adenopathy palpable Musculoskeletal-no joint tenderness, deformity or swelling Skin-warm and dry, no hyperpigmentation, vitiligo, or suspicious lesions    Gen: In bed, NAD MS: Alert and oriented CN: PERRLA, EOMI, Left field cut, head tremor.  Motor: Moving all extremities antigravity Sensory: grossly intact   Pertinent Labs: Dilantin 13.3 (uncorrected)  Etta Quill PA-C [facility] [phone]  Impression: 59 yo F with breakthrough seizures in the setting of a long history of breakthrough seizures. Her dilantin level is theraputic and she is on the maximum dose of vimpat. I would be hesitant to another medication, but maybe could get some increased benefit from a small increase in dilantin.    Recommendations: 1) increase dilantin to 130mg  qam, 200mg  qpm 2) continue vimpat 200mg  BID 3) F/u with outpatient epileptologist.   Roland Rack, MD [facility] [phone] If 7pm- 7am, please page neurology on call as listed in [place].   11/10/2014, 11:03 AM

## 2014-11-10 NOTE — Progress Notes (Signed)
UR completed 

## 2014-11-10 NOTE — Discharge Instructions (Signed)
No driving, no participating in activities at heights, or any high speed water sports.

## 2015-01-09 ENCOUNTER — Observation Stay (HOSPITAL_COMMUNITY)
Admission: EM | Admit: 2015-01-09 | Discharge: 2015-01-11 | Disposition: A | Discharge status: Home / Self Care | Payer: Commercial Managed Care - HMO | Attending: Internal Medicine | Admitting: Internal Medicine

## 2015-01-09 ENCOUNTER — Encounter (HOSPITAL_COMMUNITY): Payer: Self-pay | Admitting: Emergency Medicine

## 2015-01-09 DIAGNOSIS — F329 Major depressive disorder, single episode, unspecified: Secondary | ICD-10-CM | POA: Diagnosis not present

## 2015-01-09 DIAGNOSIS — J45909 Unspecified asthma, uncomplicated: Secondary | ICD-10-CM | POA: Diagnosis not present

## 2015-01-09 DIAGNOSIS — G8929 Other chronic pain: Secondary | ICD-10-CM | POA: Diagnosis not present

## 2015-01-09 DIAGNOSIS — F17210 Nicotine dependence, cigarettes, uncomplicated: Secondary | ICD-10-CM | POA: Diagnosis not present

## 2015-01-09 DIAGNOSIS — R05 Cough: Secondary | ICD-10-CM | POA: Insufficient documentation

## 2015-01-09 DIAGNOSIS — Z79899 Other long term (current) drug therapy: Secondary | ICD-10-CM | POA: Diagnosis not present

## 2015-01-09 DIAGNOSIS — G40909 Epilepsy, unspecified, not intractable, without status epilepticus: Principal | ICD-10-CM

## 2015-01-09 DIAGNOSIS — M25511 Pain in right shoulder: Secondary | ICD-10-CM | POA: Diagnosis not present

## 2015-01-09 DIAGNOSIS — T17908A Unspecified foreign body in respiratory tract, part unspecified causing other injury, initial encounter: Secondary | ICD-10-CM

## 2015-01-09 DIAGNOSIS — Z86718 Personal history of other venous thrombosis and embolism: Secondary | ICD-10-CM | POA: Insufficient documentation

## 2015-01-09 DIAGNOSIS — F17200 Nicotine dependence, unspecified, uncomplicated: Secondary | ICD-10-CM | POA: Diagnosis present

## 2015-01-09 DIAGNOSIS — R531 Weakness: Secondary | ICD-10-CM | POA: Insufficient documentation

## 2015-01-09 DIAGNOSIS — W19XXXA Unspecified fall, initial encounter: Secondary | ICD-10-CM

## 2015-01-09 DIAGNOSIS — Z8673 Personal history of transient ischemic attack (TIA), and cerebral infarction without residual deficits: Secondary | ICD-10-CM | POA: Diagnosis not present

## 2015-01-09 DIAGNOSIS — R5383 Other fatigue: Secondary | ICD-10-CM | POA: Diagnosis not present

## 2015-01-09 DIAGNOSIS — K219 Gastro-esophageal reflux disease without esophagitis: Secondary | ICD-10-CM | POA: Diagnosis not present

## 2015-01-09 DIAGNOSIS — R791 Abnormal coagulation profile: Secondary | ICD-10-CM | POA: Diagnosis not present

## 2015-01-09 DIAGNOSIS — Z681 Body mass index (BMI) 19 or less, adult: Secondary | ICD-10-CM | POA: Diagnosis not present

## 2015-01-09 DIAGNOSIS — R634 Abnormal weight loss: Secondary | ICD-10-CM

## 2015-01-09 DIAGNOSIS — R569 Unspecified convulsions: Secondary | ICD-10-CM | POA: Diagnosis not present

## 2015-01-09 DIAGNOSIS — R7889 Finding of other specified substances, not normally found in blood: Secondary | ICD-10-CM

## 2015-01-09 LAB — CBC WITH DIFFERENTIAL/PLATELET
Basophils Absolute: 0 10*3/uL (ref 0.0–0.1)
Basophils Relative: 0 % (ref 0–1)
Eosinophils Absolute: 0.1 10*3/uL (ref 0.0–0.7)
Eosinophils Relative: 1 % (ref 0–5)
HCT: 40.6 % (ref 36.0–46.0)
Hemoglobin: 13 g/dL (ref 12.0–15.0)
Lymphocytes Relative: 28 % (ref 12–46)
Lymphs Abs: 1.5 10*3/uL (ref 0.7–4.0)
MCH: 33.2 pg (ref 26.0–34.0)
MCHC: 32 g/dL (ref 30.0–36.0)
MCV: 103.8 fL — ABNORMAL HIGH (ref 78.0–100.0)
Monocytes Absolute: 0.3 10*3/uL (ref 0.1–1.0)
Monocytes Relative: 5 % (ref 3–12)
Neutro Abs: 3.5 10*3/uL (ref 1.7–7.7)
Neutrophils Relative %: 66 % (ref 43–77)
Platelets: 192 10*3/uL (ref 150–400)
RBC: 3.91 MIL/uL (ref 3.87–5.11)
RDW: 12.6 % (ref 11.5–15.5)
WBC: 5.3 10*3/uL (ref 4.0–10.5)

## 2015-01-09 LAB — URINALYSIS, ROUTINE W REFLEX MICROSCOPIC
Bilirubin Urine: NEGATIVE
Glucose, UA: NEGATIVE mg/dL
Hgb urine dipstick: NEGATIVE
Ketones, ur: NEGATIVE mg/dL
Leukocytes, UA: NEGATIVE
Nitrite: NEGATIVE
Protein, ur: NEGATIVE mg/dL
Specific Gravity, Urine: 1.01 (ref 1.005–1.030)
Urobilinogen, UA: 0.2 mg/dL (ref 0.0–1.0)
pH: 7.5 (ref 5.0–8.0)

## 2015-01-09 LAB — BASIC METABOLIC PANEL
Anion gap: 6 (ref 5–15)
BUN: 10 mg/dL (ref 6–20)
CO2: 31 mmol/L (ref 22–32)
Calcium: 9.1 mg/dL (ref 8.9–10.3)
Chloride: 103 mmol/L (ref 101–111)
Creatinine, Ser: 0.7 mg/dL (ref 0.44–1.00)
GFR calc Af Amer: >60 mL/min (ref >60)
GFR calc non Af Amer: >60 mL/min (ref >60)
Glucose, Bld: 94 mg/dL (ref 65–99)
Potassium: 4.2 mmol/L (ref 3.5–5.1)
Sodium: 140 mmol/L (ref 135–145)

## 2015-01-09 LAB — PREGNANCY, URINE: Preg Test, Ur: NEGATIVE

## 2015-01-09 LAB — PHENYTOIN LEVEL, TOTAL: Phenytoin Lvl: 7.7 ug/mL — ABNORMAL LOW (ref 10.0–20.0)

## 2015-01-09 MED ORDER — PHENYTOIN SODIUM 50 MG/ML IJ SOLN
500.0000 mg | Freq: Once | INTRAMUSCULAR | Status: AC
Start: 2015-01-09 — End: 2015-01-10
  Administered 2015-01-09: 500 mg via INTRAVENOUS
  Filled 2015-01-09: qty 10

## 2015-01-09 NOTE — ED Notes (Signed)
Patient with seizure, lasting @ 1 minute. PA to bedside. Patient SPO2 decreased to 72% on RA. Patient placed on NRB. SPO2 rebounded to 92-93%. Dr Canary Brim at bedside at this time.

## 2015-01-09 NOTE — Progress Notes (Addendum)
Brief Rx note:  Phenytoin  Assessement:  Pt with Phenytoin level=7.7 on 130mg  am and 200mg  hs, Pt states compliance.  Today's albumin pending used alb from 4/8= 3.4  Corrected phenytoin =9.9 using Alb=3.4  Slightly below therapeutic range (10-20 mcg/ml)  Plan:  Give Dilantin 500mg  IV x1  If Pt is indeeed compliant may need to increase dose.  Recommend very small dose increase (30mg /day)  F/u with PCP for level.   Thank Dorrene German 01/09/2015 11:18 PM

## 2015-01-09 NOTE — ED Notes (Signed)
PER EMS- pt picked up from home c/o seizures. Reported pt had 3 witnessed seizures that last approx3-4 mins. Upon EMS to home pt was post ictal approx75mins. Arrived to ED alert and oriented per baseline with fatigue.  Pt reports taking seizure medication as prescribed.

## 2015-01-09 NOTE — ED Provider Notes (Signed)
CSN: [id number]     Arrival date & time 01/09/15  2002 History   First MD Initiated Contact with Patient 01/09/15 2024     Chief Complaint  Patient presents with  . Seizures     (Consider location/radiation/quality/duration/timing/severity/associated sxs/prior Treatment) HPI  Pt is a 59yo female with hx of cerebral hemorrhage in 1989 that required craniotomy with hematoma evacuation resulting in recurrent seizures, DDD, chronic back pain, brought to ED by EMS from home with reports of 3 witnessed seizures that lasted about 3-4 minutes per family.  Per EMS, pt was post-ictal about 10min, arrived to ED alert and oriented but fatigued.  Pt states she has been taking her seizure medication as prescribed including Dilantin: 130mg  in the morning and 200mg  at night, as well as Vimpat 200mg  twice daily.  Pt denies recent illness including no recent fever, chills, n/v/d. Pt does report 2 family members dying over the last few weeks which has caused her increased stress. Pt thinks this may have triggered tonight's seizures but states she does not know of specific triggers for her seizures.  Reports generalized fatigue and weakness at this time but denies any pain. Denies headache. Denies hitting her head.    Past Medical History  Diagnosis Date  . DVT (deep venous thrombosis)     "she's had several since 1990"  . Asthma   . Cerebral hemorrhage 1989  . Chronic back pain   . DDD (degenerative disc disease) 06/12/2013  . Kidney stones   . Chronic bronchitis     "gets it q yr"  . History of blood transfusion     "when she was a baby"  . History of stomach ulcers   . GERD (gastroesophageal reflux disease)   . Headache     "usually around time when she's had a seizure"  . Seizures     "because of her brain surgery"  . Stroke 1990's    family denies residual on 11/09/2014  . Tunnel vision     "since brain OR"   Past Surgical History  Procedure Laterality Date  . Cesarean section  8832; 1987; 1989   . Brain surgery  1989    craniotomy with hematoma evacuation  . Hemorrhoid surgery    . Tubal ligation  1990's   Family History  Problem Relation Age of Onset  . Heart attack Mother   . Heart attack Father    History  Substance Use Topics  . Smoking status: Current Every Day Smoker -- 1.50 packs/day for 40 years    Types: Cigarettes  . Smokeless tobacco: Never Used  . Alcohol Use: No   OB History    No data available     Review of Systems  Constitutional: Positive for fatigue. Negative for fever, chills, diaphoresis and appetite change.  Gastrointestinal: Negative for nausea, vomiting, abdominal pain and diarrhea.  Musculoskeletal: Negative for myalgias, back pain, neck pain and neck stiffness.  Neurological: Positive for seizures and weakness ( generalized). Negative for dizziness, light-headedness, numbness and headaches.  All other systems reviewed and are negative.     Allergies  Zonisamide; Codeine; Keppra; and Olive oil  Home Medications   Prior to Admission medications   Medication Sig Start Date End Date Taking? Authorizing Provider  lacosamide (VIMPAT) 200 MG TABS tablet Take 200 mg by mouth 2 (two) times daily.   Yes Historical Provider, MD  LORazepam (ATIVAN) 0.5 MG tablet Take 1 tablet by mouth as needed. For panic attacks. Max dose of 2  tabs in 24 hours. 12/13/14  Yes Historical Provider, MD  phenytoin (DILANTIN) 100 MG ER capsule Take 100-200 mg by mouth 2 (two) times daily.   Yes Historical Provider, MD  VENTOLIN HFA 108 (90 BASE) MCG/ACT inhaler Inhale 2 puffs into the lungs 4 (four) times daily as needed. 10/17/14  Yes Historical Provider, MD  Vitamin D, Ergocalciferol, (DRISDOL) 50000 UNITS CAPS capsule Take 50,000 Units by mouth once a week. On Wednesdays 12/15/14  Yes Historical Provider, MD  phenytoin (DILANTIN) 30 MG ER capsule Take  130 mg every morning and 2 capsules (200 mg) every night Patient not taking: Reported on 01/09/2015 11/10/14   Jonetta Osgood, MD   BP 128/69 mmHg  Pulse 65  Temp(Src) 98.7 F (37.1 C) (Oral)  Resp 18  SpO2 98% Physical Exam  Constitutional: She is oriented to person, place, and time. She appears well-developed and well-nourished. No distress.  HENT:  Head: Normocephalic and atraumatic.  Eyes: Conjunctivae and EOM are normal. Pupils are equal, round, and reactive to light. No scleral icterus.  Neck: Normal range of motion. Neck supple.  Cardiovascular: Normal rate, regular rhythm and normal heart sounds.   Pulmonary/Chest: Effort normal and breath sounds normal. No respiratory distress. She has no wheezes. She has no rales. She exhibits no tenderness.  Abdominal: Soft. Bowel sounds are normal. She exhibits no distension and no mass. There is no tenderness. There is no rebound and no guarding.  Musculoskeletal: Normal range of motion.  Neurological: She is alert and oriented to person, place, and time. She has normal strength. No cranial nerve deficit or sensory deficit. Coordination normal. GCS eye subscore is 4. GCS verbal subscore is 5. GCS motor subscore is 6.  CN II-XII in tact, speech is clear. Pt alert to person, place, and time. FROM upper and lower extremities with 5/5 strength. Normal coordination.   Skin: Skin is warm and dry. She is not diaphoretic.  Nursing note and vitals reviewed.   ED Course  Procedures (including critical care time) Labs Review Labs Reviewed  PHENYTOIN LEVEL, TOTAL - Abnormal; Notable for the following:    Phenytoin Lvl 7.7 (*)    All other components within normal limits  CBC WITH DIFFERENTIAL/PLATELET - Abnormal; Notable for the following:    MCV 103.8 (*)    All other components within normal limits  URINALYSIS, ROUTINE W REFLEX MICROSCOPIC (NOT AT [facility]) - Abnormal; Notable for the following:    APPearance CLOUDY (*)    All other components within normal limits  BASIC METABOLIC PANEL  PREGNANCY, URINE  ALBUMIN    Imaging Review No results found.   EKG  Interpretation None      MDM   Final diagnoses:  Seizures  Dilantin level too low   Pt is a 59yo female with hx of seizures, brought to ED by EMS from home after reports of pt having 3 seizures. No reports of head trauma. Pt reports fatigue but denies pain.  No focal neuro deficit on exam.  Labs: significant for low dilantin level at 7.7. Consulted with Pharmacy who ordered loading dose of IV dilantin.  Medication given in ED.  Discussed pt with Dr. Canary Brim, no seizures in ED.  Pt safe for discharge home. Home care instructions provide. Advised pt to f/u with PCP and neurology.  Family is with pt.  pt and family verbalized understanding and agreement with tx plan.    Noland Fordyce, PA-C 01/09/15 2348  11:54 PM At discharge, pt had a  gand-mal seizure that lasted about 1 minute per RN.  Will consult to admit pt.   12:00 AM Consulted with Dr. Chancy Milroy, [facility], who agreed to come evaluate pt for admission.     Noland Fordyce, PA-C 01/10/15 0006  Alfonzo Beers, MD 01/10/15 [phone]

## 2015-01-09 NOTE — ED Notes (Signed)
Bed: [facility]37 Expected date:  Expected time:  Means of arrival:  Comments: Ems seizure

## 2015-01-09 NOTE — ED Notes (Signed)
Per lab approx time for phenytoin to result is 70mins.  Junie Panning, [place] made aware

## 2015-01-10 ENCOUNTER — Observation Stay (HOSPITAL_COMMUNITY): Payer: Commercial Managed Care - HMO

## 2015-01-10 ENCOUNTER — Encounter (HOSPITAL_COMMUNITY): Payer: Self-pay

## 2015-01-10 DIAGNOSIS — G8929 Other chronic pain: Secondary | ICD-10-CM | POA: Diagnosis not present

## 2015-01-10 DIAGNOSIS — F17210 Nicotine dependence, cigarettes, uncomplicated: Secondary | ICD-10-CM | POA: Diagnosis not present

## 2015-01-10 DIAGNOSIS — K219 Gastro-esophageal reflux disease without esophagitis: Secondary | ICD-10-CM | POA: Diagnosis not present

## 2015-01-10 DIAGNOSIS — F17200 Nicotine dependence, unspecified, uncomplicated: Secondary | ICD-10-CM | POA: Diagnosis not present

## 2015-01-10 DIAGNOSIS — R5383 Other fatigue: Secondary | ICD-10-CM | POA: Diagnosis not present

## 2015-01-10 DIAGNOSIS — Z79899 Other long term (current) drug therapy: Secondary | ICD-10-CM | POA: Diagnosis not present

## 2015-01-10 DIAGNOSIS — S299XXA Unspecified injury of thorax, initial encounter: Secondary | ICD-10-CM | POA: Diagnosis not present

## 2015-01-10 DIAGNOSIS — R569 Unspecified convulsions: Secondary | ICD-10-CM | POA: Diagnosis not present

## 2015-01-10 DIAGNOSIS — J4520 Mild intermittent asthma, uncomplicated: Secondary | ICD-10-CM | POA: Diagnosis not present

## 2015-01-10 DIAGNOSIS — Z86718 Personal history of other venous thrombosis and embolism: Secondary | ICD-10-CM | POA: Diagnosis not present

## 2015-01-10 DIAGNOSIS — G40909 Epilepsy, unspecified, not intractable, without status epilepticus: Secondary | ICD-10-CM | POA: Diagnosis not present

## 2015-01-10 DIAGNOSIS — J45909 Unspecified asthma, uncomplicated: Secondary | ICD-10-CM | POA: Diagnosis not present

## 2015-01-10 DIAGNOSIS — Z8673 Personal history of transient ischemic attack (TIA), and cerebral infarction without residual deficits: Secondary | ICD-10-CM | POA: Diagnosis not present

## 2015-01-10 DIAGNOSIS — M25511 Pain in right shoulder: Secondary | ICD-10-CM | POA: Diagnosis not present

## 2015-01-10 LAB — CBG MONITORING, ED: GLUCOSE-CAPILLARY: 90 mg/dL (ref 65–99)

## 2015-01-10 LAB — CBC
HEMATOCRIT: 41.6 % (ref 36.0–46.0)
HEMATOCRIT: 42.8 % (ref 36.0–46.0)
Hemoglobin: 13.3 g/dL (ref 12.0–15.0)
Hemoglobin: 13.4 g/dL (ref 12.0–15.0)
MCH: 33.3 pg (ref 26.0–34.0)
MCH: 33 pg (ref 26.0–34.0)
MCHC: 32 g/dL (ref 30.0–36.0)
MCHC: 31.3 g/dL (ref 30.0–36.0)
MCV: 104 fL — ABNORMAL HIGH (ref 78.0–100.0)
MCV: 105.4 fL — ABNORMAL HIGH (ref 78.0–100.0)
PLATELETS: 179 10*3/uL (ref 150–400)
Platelets: 180 10*3/uL (ref 150–400)
RBC: 4 MIL/uL (ref 3.87–5.11)
RBC: 4.06 MIL/uL (ref 3.87–5.11)
RDW: 12.6 % (ref 11.5–15.5)
RDW: 12.7 % (ref 11.5–15.5)
WBC: 6.6 10*3/uL (ref 4.0–10.5)
WBC: 6 10*3/uL (ref 4.0–10.5)

## 2015-01-10 LAB — COMPREHENSIVE METABOLIC PANEL
ALT: 12 U/L — ABNORMAL LOW (ref 14–54)
ANION GAP: 9 (ref 5–15)
AST: 16 U/L (ref 15–41)
Albumin: 3.6 g/dL (ref 3.5–5.0)
Alkaline Phosphatase: 75 U/L (ref 38–126)
BILIRUBIN TOTAL: 0.4 mg/dL (ref 0.3–1.2)
BUN: 8 mg/dL (ref 6–20)
CHLORIDE: 107 mmol/L (ref 101–111)
CO2: 27 mmol/L (ref 22–32)
Calcium: 8.9 mg/dL (ref 8.9–10.3)
Creatinine, Ser: 0.59 mg/dL (ref 0.44–1.00)
GFR calc non Af Amer: >60 mL/min (ref >60)
Glucose, Bld: 101 mg/dL — ABNORMAL HIGH (ref 65–99)
POTASSIUM: 4 mmol/L (ref 3.5–5.1)
SODIUM: 143 mmol/L (ref 135–145)
Total Protein: 6.5 g/dL (ref 6.5–8.1)

## 2015-01-10 LAB — ALBUMIN: ALBUMIN: 3.8 g/dL (ref 3.5–5.0)

## 2015-01-10 LAB — CREATININE, SERUM
Creatinine, Ser: 0.75 mg/dL (ref 0.44–1.00)
GFR calc Af Amer: >60 mL/min (ref >60)

## 2015-01-10 LAB — PHENYTOIN LEVEL, TOTAL: Phenytoin Lvl: 12.1 ug/mL (ref 10.0–20.0)

## 2015-01-10 MED ORDER — PHENYTOIN SODIUM EXTENDED 100 MG PO CAPS
100.0000 mg | ORAL_CAPSULE | Freq: Every day | ORAL | Status: DC
  Administered 2015-01-10 – 2015-01-11 (×2): 100 mg via ORAL
  Filled 2015-01-10 (×2): qty 1

## 2015-01-10 MED ORDER — PHENYTOIN SODIUM EXTENDED 100 MG PO CAPS: 100.0000 mg | ORAL_CAPSULE | Freq: Two times a day (BID) | ORAL | Status: DC

## 2015-01-10 MED ORDER — PHENYTOIN SODIUM EXTENDED 100 MG PO CAPS
200.0000 mg | ORAL_CAPSULE | Freq: Every day | ORAL | Status: DC
  Administered 2015-01-10: 200 mg via ORAL
  Filled 2015-01-10: qty 2

## 2015-01-10 MED ORDER — LORAZEPAM 0.5 MG PO TABS: 0.5000 mg | ORAL_TABLET | ORAL | Status: DC | PRN

## 2015-01-10 MED ORDER — ALBUTEROL SULFATE (2.5 MG/3ML) 0.083% IN NEBU
2.5000 mg | INHALATION_SOLUTION | Freq: Four times a day (QID) | RESPIRATORY_TRACT | Status: DC | PRN
  Administered 2015-01-11: 2.5 mg via RESPIRATORY_TRACT
  Filled 2015-01-10: qty 3

## 2015-01-10 MED ORDER — ACETAMINOPHEN 325 MG PO TABS
650.0000 mg | ORAL_TABLET | Freq: Four times a day (QID) | ORAL | Status: DC | PRN
  Administered 2015-01-10 – 2015-01-11 (×3): 650 mg via ORAL
  Filled 2015-01-10 (×3): qty 2

## 2015-01-10 MED ORDER — LORAZEPAM 0.5 MG PO TABS
0.5000 mg | ORAL_TABLET | Freq: Four times a day (QID) | ORAL | Status: DC | PRN
  Administered 2015-01-10: 0.5 mg via ORAL
  Filled 2015-01-10: qty 1

## 2015-01-10 MED ORDER — LACOSAMIDE 50 MG PO TABS
200.0000 mg | ORAL_TABLET | Freq: Two times a day (BID) | ORAL | Status: DC
  Administered 2015-01-10 – 2015-01-11 (×3): 200 mg via ORAL
  Filled 2015-01-10 (×3): qty 4

## 2015-01-10 MED ORDER — HEPARIN SODIUM (PORCINE) 5000 UNIT/ML IJ SOLN
5000.0000 [IU] | Freq: Three times a day (TID) | INTRAMUSCULAR | Status: DC
  Administered 2015-01-10 – 2015-01-11 (×4): 5000 [IU] via SUBCUTANEOUS
  Filled 2015-01-10 (×6): qty 1

## 2015-01-10 MED ORDER — ALBUTEROL SULFATE HFA 108 (90 BASE) MCG/ACT IN AERS: 2.0000 | INHALATION_SPRAY | Freq: Four times a day (QID) | RESPIRATORY_TRACT | Status: DC | PRN

## 2015-01-10 MED ORDER — SODIUM CHLORIDE 0.9 % IJ SOLN
3.0000 mL | Freq: Two times a day (BID) | INTRAMUSCULAR | Status: DC
  Administered 2015-01-10 – 2015-01-11 (×3): 3 mL via INTRAVENOUS

## 2015-01-10 MED ORDER — VITAMIN D (ERGOCALCIFEROL) 1.25 MG (50000 UNIT) PO CAPS
50000.0000 [IU] | ORAL_CAPSULE | ORAL | Status: DC
  Administered 2015-01-10: 50000 [IU] via ORAL
  Filled 2015-01-10 (×2): qty 1

## 2015-01-10 MED ORDER — ONDANSETRON HCL 4 MG/2ML IJ SOLN: 4.0000 mg | Freq: Four times a day (QID) | INTRAMUSCULAR | Status: DC | PRN

## 2015-01-10 MED ORDER — LORAZEPAM 2 MG/ML IJ SOLN
1.0000 mg | INTRAMUSCULAR | Status: DC | PRN
Start: 2015-01-10 — End: 2015-01-11

## 2015-01-10 MED ORDER — ONDANSETRON HCL 4 MG PO TABS: 4.0000 mg | ORAL_TABLET | Freq: Four times a day (QID) | ORAL | Status: DC | PRN

## 2015-01-10 MED ORDER — SODIUM CHLORIDE 0.9 % IV SOLN
INTRAVENOUS | Status: DC
  Administered 2015-01-10: 01:00:00 via INTRAVENOUS

## 2015-01-10 MED ORDER — ACETAMINOPHEN 650 MG RE SUPP: 650.0000 mg | Freq: Four times a day (QID) | RECTAL | Status: DC | PRN

## 2015-01-10 MED ORDER — SODIUM CHLORIDE 0.9 % IV SOLN
75.0000 mL/h | INTRAVENOUS | Status: DC
  Administered 2015-01-10: 75 mL/h via INTRAVENOUS

## 2015-01-10 NOTE — Progress Notes (Addendum)
[facility] PROGRESS NOTE  [name] UGQ:[id number] DOB: 04-21-1956 DOA: 01/09/2015 PCP: [name], MD   Same Day Note - Admitted after Midnight  Assessment/Plan: 1. Recurrent seizures 1. Dilantin level subtherapeutic on presentation 2. Pt loaded with dilantin in ED with levels improved at over 12 3. Pharmacy assisting with dosing 4. Will continue on PRN ativan for seizures 5. Will cont to observe overnight and if stable in AM, consider d/c home 2. Cough 1. Pt with increased cough 2. Question aspiration in setting of seizures 3. Will check CXR 3. R shoulder pain 1. Question if shoulder injury from fall during seizure 2. Shoulder is quite tender on exam with limited ROM 3. Will obtain R shoulder Xray 4. Asthma 1. Stable 2. No wheezing on exam 5. Tobacco abuse 1. Cessation done face to face 6. Depression 1. Stable 2. Denies suicidal ideations 7. DVT prophylaxis 1. Heparin subQ  Code Status: Full Family Communication: Pt in room (indicate person spoken with, relationship, and if by phone, the number) Disposition Plan: Pending   Consultants:    Procedures:    Antibiotics:   (indicate start date, and stop date if known)  HPI/Subjective: Complains of R shoulder pain and cough  Objective: Filed Vitals:   01/10/15 0746 01/10/15 0954 01/10/15 1300 01/10/15 1346  BP: 103/46 98/54 101/54 109/47  Pulse: 81 87 79 96  Temp: 100.2 F (37.9 C)  99.1 F (37.3 C) 99.1 F (37.3 C)  TempSrc: Oral  Oral Oral  Resp: 16 20 14 17   Height:    5\' 7"  (1.702 m)  Weight:    50.803 kg (112 lb)  SpO2: 95% 96% 94% 96%   No intake or output data in the 24 hours ending 01/10/15 1634 Filed Weights   01/10/15 1346  Weight: 50.803 kg (112 lb)    Exam:   General:  Awake, in nad  Cardiovascular: regular, s1, s2  Respiratory: normal resp effort, no wheezing  Abdomen: soft,nondistended  Musculoskeletal: perfused, no clubbing, R shoulder markedly tender to minimal  palpation with limited ROM   Data Reviewed: Basic Metabolic Panel:  Recent Labs Lab 01/09/15 2047 01/10/15 0428 01/10/15 1405  NA 140 143  --   K 4.2 4.0  --   CL 103 107  --   CO2 31 27  --   GLUCOSE 94 101*  --   BUN 10 8  --   CREATININE 0.70 0.59 0.75  CALCIUM 9.1 8.9  --    Liver Function Tests:  Recent Labs Lab 01/09/15 2348 01/10/15 0428  AST  --  16  ALT  --  12*  ALKPHOS  --  75  BILITOT  --  0.4  PROT  --  6.5  ALBUMIN 3.8 3.6   No results for input(s): LIPASE, AMYLASE in the last 168 hours. No results for input(s): AMMONIA in the last 168 hours. CBC:  Recent Labs Lab 01/09/15 2047 01/10/15 0428 01/10/15 1405  WBC 5.3 6.6 6.0  NEUTROABS 3.5  --   --   HGB 13.0 13.3 13.4  HCT 40.6 41.6 42.8  MCV 103.8* 104.0* 105.4*  PLT 192 179 180   Cardiac Enzymes: No results for input(s): CKTOTAL, CKMB, CKMBINDEX, TROPONINI in the last 168 hours. BNP (last 3 results) No results for input(s): BNP in the last 8760 hours.  ProBNP (last 3 results) No results for input(s): PROBNP in the last 8760 hours.  CBG:  Recent Labs Lab 01/10/15 0747  GLUCAP 90  No results found for this or any previous visit (from the past 240 hour(s)).   Studies: Dg Chest Port 1 View  01/10/2015   CLINICAL DATA:  Fall.  Seizure.  Right shoulder pain.  EXAM: PORTABLE CHEST - 1 VIEW  COMPARISON:  01/10/2015  FINDINGS: The heart size and mediastinal contours are within normal limits. Both lungs are clear. The visualized skeletal structures are unremarkable.  IMPRESSION: No active disease.   Electronically Signed   By: Kerby Moors M.D.   On: 01/10/2015 15:58   Dg Shoulder Right Port  01/10/2015   CLINICAL DATA:  Fall.  Seizure.  Right shoulder pain.  EXAM: PORTABLE RIGHT SHOULDER - 2+ VIEW  COMPARISON:  None.  FINDINGS: There is no evidence of fracture or dislocation. There is no evidence of arthropathy or other focal bone abnormality. Soft tissues are unremarkable.  IMPRESSION:  Negative.   Electronically Signed   By: Kerby Moors M.D.   On: 01/10/2015 15:55    Scheduled Meds: . heparin  5,000 Units Subcutaneous 3 times per day  . lacosamide  200 mg Oral BID  . phenytoin  100 mg Oral Daily  . phenytoin  200 mg Oral QHS  . sodium chloride  3 mL Intravenous Q12H  . Vitamin D (Ergocalciferol)  50,000 Units Oral Weekly   Continuous Infusions:   Active Problems:   Seizure disorder   Tobacco dependence   Asthma   Seizure   Sherrill Buikema, [facility] Pager [phone]. If 7PM-7AM, please contact night-coverage at [email or web address], password [facility] 01/10/2015, 4:34 PM

## 2015-01-10 NOTE — Plan of Care (Signed)
Problem: Phase I Progression Outcomes Goal: OOB as tolerated unless otherwise ordered Outcome: Not Applicable Date Met:  99/14/44 Pt bed rest at this time

## 2015-01-10 NOTE — ED Notes (Signed)
Pt can go up at 13:13

## 2015-01-10 NOTE — H&P (Signed)
[facility] History and Physical  DANESE DORSAINVIL HFW:[id number] DOB: 10/31/1955 DOA: 01/09/2015  Referring physician: Curtis Sites, PA PCP: [name], MD   Chief Complaint: Seizure  HPI: [name] is a 59 y.o. female with history of asthma and seizures presents with an acute seizure. Patient is currently post-ictal. Her daughter states that she had a seizure in her absence. Three total seizures occurred as per the patients mother and she was more somnolent after the seizures at home. Her mother called EMS and brought her to the ED. According to the daughter she has been taking her medications. She states that she has a history of fluctuations in her dilantin level and in addition there has been a lot of stress in the house. Patient was loaded with dilantin in the ED but she had some burning in the IV and so it was stopped. She was to be discharged but had another seizure while in the ED. She has loss of bladder control.   Review of Systems:  Patient is post-ictal and is not able to provide a ROS  Past Medical History  Diagnosis Date  . DVT (deep venous thrombosis)     "she's had several since 1990"  . Asthma   . Cerebral hemorrhage 1989  . Chronic back pain   . DDD (degenerative disc disease) 06/12/2013  . Kidney stones   . Chronic bronchitis     "gets it q yr"  . History of blood transfusion     "when she was a baby"  . History of stomach ulcers   . GERD (gastroesophageal reflux disease)   . Headache     "usually around time when she's had a seizure"  . Seizures     "because of her brain surgery"  . Stroke 1990's    family denies residual on 11/09/2014  . Tunnel vision     "since brain OR"   Past Surgical History  Procedure Laterality Date  . Cesarean section  0277; 1987; 1989  . Brain surgery  1989    craniotomy with hematoma evacuation  . Hemorrhoid surgery    . Tubal ligation  1990's   Social History:  reports that she has been smoking Cigarettes.  She has  a 60 pack-year smoking history. She has never used smokeless tobacco. She reports that she does not drink alcohol or use illicit drugs.  Allergies  Allergen Reactions  . Zonisamide Other (See Comments)    Numbness and tingling over whole body  . Codeine Nausea And Vomiting  . Keppra [Levetiracetam] Other (See Comments)    Causes seizures.   [name] Oil Rash    Family History  Problem Relation Age of Onset  . Heart attack Mother   . Heart attack Father      Prior to Admission medications   Medication Sig Start Date End Date Taking? Authorizing Provider  lacosamide (VIMPAT) 200 MG TABS tablet Take 200 mg by mouth 2 (two) times daily.   Yes Historical Provider, MD  LORazepam (ATIVAN) 0.5 MG tablet Take 1 tablet by mouth as needed. For panic attacks. Max dose of 2 tabs in 24 hours. 12/13/14  Yes Historical Provider, MD  phenytoin (DILANTIN) 100 MG ER capsule Take 100-200 mg by mouth 2 (two) times daily.   Yes Historical Provider, MD  VENTOLIN HFA 108 (90 BASE) MCG/ACT inhaler Inhale 2 puffs into the lungs 4 (four) times daily as needed. 10/17/14  Yes Historical Provider, MD  Vitamin D, Ergocalciferol, (DRISDOL) 50000  UNITS CAPS capsule Take 50,000 Units by mouth once a week. On Wednesdays 12/15/14  Yes Historical Provider, MD  phenytoin (DILANTIN) 30 MG ER capsule Take  130 mg every morning and 2 capsules (200 mg) every night Patient not taking: Reported on 01/09/2015 11/10/14   Jonetta Osgood, MD   Physical Exam: Filed Vitals:   01/09/15 2352 01/09/15 2353 01/09/15 2354 01/09/15 2356  BP:    128/69  Pulse:    65  Temp:      TempSrc:      Resp:    18  SpO2: 67% 71% 92% 98%    Wt Readings from Last 3 Encounters:  11/09/14 50.1 kg (110 lb 7.2 oz)  09/12/14 53.524 kg (118 lb)  05/14/14 55.339 kg (122 lb)    General:  Appears somnolent Eyes: unable to assess ENT: post-ictal Neck: no mass Cardiovascular: RRR, no m/r/g. No LE edema Respiratory: CTA bilaterally, no w/r/r. Normal  respiratory effort. Abdomen: soft, no organomegaly Skin: no rash or induration Musculoskeletal: no synovitis visible Psychiatric: unable to assess Neurologic: unable to assess post-ictal.          Labs on Admission:  Basic Metabolic Panel:  Recent Labs Lab 01/09/15 2047  NA 140  K 4.2  CL 103  CO2 31  GLUCOSE 94  BUN 10  CREATININE 0.70  CALCIUM 9.1   Liver Function Tests: No results for input(s): AST, ALT, ALKPHOS, BILITOT, PROT, ALBUMIN in the last 168 hours. No results for input(s): LIPASE, AMYLASE in the last 168 hours. No results for input(s): AMMONIA in the last 168 hours. CBC:  Recent Labs Lab 01/09/15 2047  WBC 5.3  NEUTROABS 3.5  HGB 13.0  HCT 40.6  MCV 103.8*  PLT 192   Cardiac Enzymes: No results for input(s): CKTOTAL, CKMB, CKMBINDEX, TROPONINI in the last 168 hours.  BNP (last 3 results) No results for input(s): BNP in the last 8760 hours.  ProBNP (last 3 results) No results for input(s): PROBNP in the last 8760 hours.  CBG: No results for input(s): GLUCAP in the last 168 hours.  Radiological Exams on Admission: No results found.    Assessment/Plan Active Problems:   Seizure disorder   Tobacco dependence   Asthma   Seizure   1. Seizure disorder -patient was loaded in the ED with phenytoin but not completely will have pharmacy reassess dosing -currently she is post-ictal -will continue with phenytoin and recheck levels  2. Asthma -will continue with inhalers -currently stable  3. Tobacco Use -smoking cessation counseling     Code Status: Full Code (must indicate code status--if unknown or must be presumed, indicate so) DVT Prophylaxis:heparin Family Communication: None (indicate person spoken with, if applicable, with phone number if by telephone) Disposition Plan: home (indicate anticipated LOS)  Time spent: 60min  KHAN,SAADAT A [facility] Pager [phone]

## 2015-01-10 NOTE — Care Management Note (Signed)
Case Management Note  Patient Details  Name: AURIAH HOLLINGS MRN: [id number] Date of Birth: 1956/03/01  Subjective/Objective: 59 y/o f admitted w/seizures.From home.                   Action/Plan:d/c plan  Home.No anticipated d/c needs.   Expected Discharge Date:   (UNKNOWN)               Expected Discharge Plan:  Home/Self Care  In-House Referral:     Discharge planning Services  CM Consult  Post Acute Care Choice:    Choice offered to:     DME Arranged:    DME Agency:     HH Arranged:    HH Agency:     Status of Service:  In process, will continue to follow  Medicare Important Message Given:    Date Medicare IM Given:    Medicare IM give by:    Date Additional Medicare IM Given:    Additional Medicare Important Message give by:     If discussed at [place] of Stay Meetings, dates discussed:    Additional Comments:  Dessa Phi, RN 01/10/2015, 1:46 PM

## 2015-01-11 DIAGNOSIS — Z86718 Personal history of other venous thrombosis and embolism: Secondary | ICD-10-CM | POA: Diagnosis not present

## 2015-01-11 DIAGNOSIS — R634 Abnormal weight loss: Secondary | ICD-10-CM

## 2015-01-11 DIAGNOSIS — R569 Unspecified convulsions: Secondary | ICD-10-CM | POA: Diagnosis not present

## 2015-01-11 DIAGNOSIS — G40909 Epilepsy, unspecified, not intractable, without status epilepticus: Secondary | ICD-10-CM

## 2015-01-11 DIAGNOSIS — J45909 Unspecified asthma, uncomplicated: Secondary | ICD-10-CM | POA: Diagnosis not present

## 2015-01-11 DIAGNOSIS — Z8673 Personal history of transient ischemic attack (TIA), and cerebral infarction without residual deficits: Secondary | ICD-10-CM | POA: Diagnosis not present

## 2015-01-11 DIAGNOSIS — F17200 Nicotine dependence, unspecified, uncomplicated: Secondary | ICD-10-CM

## 2015-01-11 DIAGNOSIS — K219 Gastro-esophageal reflux disease without esophagitis: Secondary | ICD-10-CM | POA: Diagnosis not present

## 2015-01-11 DIAGNOSIS — R5383 Other fatigue: Secondary | ICD-10-CM | POA: Diagnosis not present

## 2015-01-11 DIAGNOSIS — Z79899 Other long term (current) drug therapy: Secondary | ICD-10-CM | POA: Diagnosis not present

## 2015-01-11 DIAGNOSIS — G8929 Other chronic pain: Secondary | ICD-10-CM | POA: Diagnosis not present

## 2015-01-11 DIAGNOSIS — J441 Chronic obstructive pulmonary disease with (acute) exacerbation: Secondary | ICD-10-CM

## 2015-01-11 DIAGNOSIS — F17210 Nicotine dependence, cigarettes, uncomplicated: Secondary | ICD-10-CM | POA: Diagnosis not present

## 2015-01-11 LAB — GLUCOSE, CAPILLARY: Glucose-Capillary: 101 mg/dL — ABNORMAL HIGH (ref 65–99)

## 2015-01-11 LAB — PHENYTOIN LEVEL, TOTAL: Phenytoin Lvl: 11.4 ug/mL (ref 10.0–20.0)

## 2015-01-11 MED ORDER — PREDNISONE 50 MG PO TABS
60.0000 mg | ORAL_TABLET | Freq: Every day | ORAL | Status: DC
  Filled 2015-01-11: qty 1

## 2015-01-11 MED ORDER — PREDNISONE 5 MG PO TABS
5.0000 mg | ORAL_TABLET | Freq: Every day | ORAL | Status: DC
Start: 2015-01-11 — End: 2015-01-19

## 2015-01-11 MED ORDER — ENSURE ENLIVE PO LIQD: 237.0000 mL | Freq: Two times a day (BID) | ORAL | Status: DC

## 2015-01-11 NOTE — Progress Notes (Addendum)
Initial Nutrition Assessment  DOCUMENTATION CODES:  Severe malnutrition in context of chronic illness, Severe malnutrition in context of social or environmental circumstances, Underweight  INTERVENTION: - Will order Ensure Enlive po BID, each supplement provides 350 kcal and 20 grams of protein - Will order Magic cup BID with meals, each supplement provides 290 kcal and 9 grams of protein - Encourage PO intakes of meals and supplements - RD will continue to monitor for needs  NUTRITION DIAGNOSIS:  Malnutrition related to social / environmental circumstances, other (see comment) (lack of appetite) as evidenced by severe depletion of body fat, moderate depletions of muscle mass, severe depletion of muscle mass.  GOAL:  Patient will meet greater than or equal to 90% of their needs  MONITOR:  PO intake, Supplement acceptance, Weight trends, Labs, I & O's  REASON FOR ASSESSMENT:  Other (Comment) (underweight BMI)  ASSESSMENT: Per H&P, 59 y.o. female with history of asthma and seizures presents with an acute seizure. Patient is currently post-ictal. Her daughter states that she had a seizure in her absence. Three total seizures occurred as per the patients mother and she was more somnolent after the seizures at home. Her mother called EMS and brought her to the ED. According to the daughter she has been taking her medications.  Pt seen for underweight BMI. Pt reports she ate most of a sausage biscuit and rice krispies for breakfast this AM. She states that PTA she has had a poor appetite for over a year and forces herself to eat; sometimes she will even get up at night to eat a sandwich. Pt reports her children constantly comment on her weight/weight loss and continually tell her to eat more. She states that she is living with her mother whom asks her to do many things during the day so she is rarely able to sit and rest and is constantly moving and busy. She indicates UBW of 130 lbs but  that she has not weighed this since 1989. Per weight hx review, she has lost 6 lbs (5% body weight) in the past 4 months which is not significant for time frame.   Pt concerned about weight loss and talked with her about adding calories to foods by using items such as heavy cream, butter, and sauces/gravies. Also encouraged protein consumption at each meal/snacks. She indicates that she is feeling very stressed and anxious lately due to above home situation and recent passing of a cousin and uncle. Physical assessment shows severe fat and moderate and severe muscle wasting.  Talked with pt about supplements and she is interested in trying Ensure Enlive and [employer]; will order both BID. Likely not meeting needs PTA. Medications and labs reviewed.  Height:  Ht Readings from Last 1 Encounters:  01/10/15 5\' 7"  (1.702 m)    Weight:  Wt Readings from Last 1 Encounters:  01/10/15 112 lb (50.803 kg)    Ideal Body Weight:  61.4 kg (kg)  Wt Readings from Last 10 Encounters:  01/10/15 112 lb (50.803 kg)  11/09/14 110 lb 7.2 oz (50.1 kg)  09/12/14 118 lb (53.524 kg)  05/14/14 122 lb (55.339 kg)  06/17/13 119 lb 4.3 oz (54.1 kg)  06/12/13 134 lb 7.7 oz (61 kg)  06/09/13 120 lb (54.432 kg)  04/15/13 114 lb 10.2 oz (52 kg)  01/23/13 113 lb 5.1 oz (51.4 kg)  12/13/12 118 lb (53.524 kg)    BMI:  Body mass index is 17.54 kg/(m^2).  Estimated Nutritional Needs:  Kcal:  1550-1750  Protein:  60-70 grams  Fluid:  2.5 L/day  Skin:  Wound (see comment) (ecchymosis to back and shoulder)  Diet Order:  Diet regular Room service appropriate?: Yes; Fluid consistency:: Thin  EDUCATION NEEDS:  No education needs identified at this time   Intake/Output Summary (Last 24 hours) at 01/11/15 1038 Last data filed at 01/11/15 0700  Gross per 24 hour  Intake    440 ml  Output      0 ml  Net    440 ml    Last BM:  6/6    Jarome Matin, RD, LDN Inpatient Clinical Dietitian Pager #  [phone] After hours/weekend pager # [phone]   ADDENDUM: lab values erroneously entered into this note that pertained to another pt. Error corrected.

## 2015-01-11 NOTE — Care Management Note (Signed)
Case Management Note  Patient Details  Name: [name] MRN: [id number] Date of Birth: 1956/03/17  Subjective/Objective:   59 y/o y f admitted w/Asthma.From home.                 Action/Plan:No needs or orders.   Expected Discharge Date:   (UNKNOWN)               Expected Discharge Plan:  Home/Self Care  In-House Referral:     Discharge planning Services  CM Consult  Post Acute Care Choice:    Choice offered to:     DME Arranged:    DME Agency:     HH Arranged:    [place] Agency:     Status of Service:  Completed, signed off  Medicare Important Message Given:    Date Medicare IM Given:    Medicare IM give by:    Date Additional Medicare IM Given:    Additional Medicare Important Message give by:     If discussed at [place] of Stay Meetings, dates discussed:    Additional Comments:  Dessa Phi, RN 01/11/2015, 11:56 AM

## 2015-01-11 NOTE — Discharge Summary (Addendum)
Physician Discharge Summary  [name] [id number]:[id number] DOB: September 13, 1955 DOA: 01/09/2015  PCP: [name], MD  Admit date: 01/09/2015 Discharge date: 01/11/2015  Time spent: 20 minutes  Recommendations for Outpatient Follow-up:  1. Follow up with PCP in 1-2 weeks 2. Follow up with Neurology at earliest available appointment 3. Please work up unintentional weight loss. Pt is colonoscopy naive and would benefit from age appropriate cancer screening 4. Recommend outpatient PFT's to formally diagnose COPD  Discharge Diagnoses:  Active Problems:   Seizure disorder   Tobacco dependence   Asthma   Seizure Suspected COPD exacerbation  Discharge Condition: Stable  Diet recommendation: Regular  Filed Weights   01/10/15 1346  Weight: 50.803 kg (112 lb)    History of present illness:  Please see admit h and p from 6/8 for details. Briefly, pt presented with recurrent seizures, found to have subtherapeutic dilantin level. The patient was noted to have a witnessed seizure in the ED and was subsequently admitted for observation.  Hospital Course:  1. Recurrent seizures 1. Dilantin level was subtherapeutic on presentation 2. Pt loaded with dilantin in ED with levels improved to therapeutic range 3. Discussed with pharmacy who agrees with dilantin at 100mg  am and 200mg  PM with close Neurology follow up 4. Patient reminded not to drive or handle children 2. Cough/COPD exacerbation 1. Pt with increased cough in setting of tobacco abuse 2. CXR without evidence of pneumonia 3. Pt notes improvement with breathing treatments 4. Will treat with prednisone taper as outpatient 5. No wheezing on discharge 3. R shoulder pain 1. Question if shoulder injury from fall during seizure 2. Shoulder is quite tender on exam with limited ROM 3. X ray of shoulder without fracture 4. Asthma 1. Stable 2. No wheezing on exam 5. Tobacco abuse 1. Cessation done face to face 6. Depression 1. Stable 2. Denies  suicidal ideations 7. DVT prophylaxis 1. Heparin subQ 8. Unintentional weight loss 1. Pt reports 10lb wt loss over 6 months 2. Pt is colonoscopy naive 3. Recommend age appropriate cancer screening as outpatient  Consultations:  none  Discharge Exam: Filed Vitals:   01/10/15 1346 01/10/15 2301 01/11/15 0525 01/11/15 0909  BP: 109/47 111/53 97/54   Pulse: 96 68 82   Temp: 99.1 F (37.3 C) 99.4 F (37.4 C) 98.3 F (36.8 C)   TempSrc: Oral Oral Oral   Resp: 17 18 18    Height: 5\' 7"  (1.702 m)     Weight: 50.803 kg (112 lb)     SpO2: 96% 92% 93% 92%    General: awake, in nad Cardiovascular: regular, s1, s2 Respiratory: normal resp effort, no wheezing  Discharge Instructions     Medication List    TAKE these medications        lacosamide 200 MG Tabs tablet  Commonly known as:  VIMPAT  Take 200 mg by mouth 2 (two) times daily.     LORazepam 0.5 MG tablet  Commonly known as:  ATIVAN  Take 1 tablet by mouth as needed. For panic attacks. Max dose of 2 tabs in 24 hours.     VENTOLIN HFA 108 (90 BASE) MCG/ACT inhaler  Generic drug:  albuterol  Inhale 2 puffs into the lungs 4 (four) times daily as needed.     Vitamin D (Ergocalciferol) 50000 UNITS Caps capsule  Commonly known as:  DRISDOL  Take 50,000 Units by mouth once a week. On Wednesdays      ASK your doctor about these medications  phenytoin 100 MG ER capsule  Commonly known as:  DILANTIN  Take 100-200 mg by mouth 2 (two) times daily. 1 capsule in the morning and 2 capsules at bedtime.     phenytoin 30 MG ER capsule  Commonly known as:  DILANTIN  Take  130 mg every morning and 2 capsules (200 mg) every night       Allergies  Allergen Reactions  . Zonisamide Other (See Comments)    Numbness and tingling over whole body  . Codeine Nausea And Vomiting  . Keppra [Levetiracetam] Other (See Comments)    Causes seizures.   [name] Oil Rash       Follow-up Information    Follow up with [name], MD.   Specialty:  Internal Medicine   Why:  Follow up with your primary care provider for ongoing healthcare needs including continued management of seizures   Contact information:   [address] [place] [address] [phone]       Follow up with Lenor Coffin, MD.   Specialty:  Neurology   Why:  Call to schedule appointment with Dr. [name] to review medications due to low dilantin level tonight.   Contact information:   [address] [place] [address] [phone]       Follow up with [name], MD. Schedule an appointment as soon as possible for a visit in 1 week.   Specialty:  Internal Medicine   Why:  Hospital follow up   Contact information:   [address] [place] [address] [phone]        The results of significant diagnostics from this hospitalization (including imaging, microbiology, ancillary and laboratory) are listed below for reference.    Significant Diagnostic Studies: Dg Chest Port 1 View  01/10/2015   CLINICAL DATA:  Fall.  Seizure.  Right shoulder pain.  EXAM: PORTABLE CHEST - 1 VIEW  COMPARISON:  01/10/2015  FINDINGS: The heart size and mediastinal contours are within normal limits. Both lungs are clear. The visualized skeletal structures are unremarkable.  IMPRESSION: No active disease.   Electronically Signed   By: Kerby Moors M.D.   On: 01/10/2015 15:58   Dg Shoulder Right Port  01/10/2015   CLINICAL DATA:  Fall.  Seizure.  Right shoulder pain.  EXAM: PORTABLE RIGHT SHOULDER - 2+ VIEW  COMPARISON:  None.  FINDINGS: There is no evidence of fracture or dislocation. There is no evidence of arthropathy or other focal bone abnormality. Soft tissues are unremarkable.  IMPRESSION: Negative.   Electronically Signed   By: Kerby Moors M.D.   On: 01/10/2015 15:55    Microbiology: No results found for this or any previous visit (from the past 240 hour(s)).   Labs: Basic Metabolic  Panel:  Recent Labs Lab 01/09/15 2047 01/10/15 0428 01/10/15 1405  NA 140 143  --   K 4.2 4.0  --   CL 103 107  --   CO2 31 27  --   GLUCOSE 94 101*  --   BUN 10 8  --   CREATININE 0.70 0.59 0.75  CALCIUM 9.1 8.9  --    Liver Function Tests:  Recent Labs Lab 01/09/15 2348 01/10/15 0428  AST  --  16  ALT  --  12*  ALKPHOS  --  75  BILITOT  --  0.4  PROT  --  6.5  ALBUMIN 3.8 3.6   No results for input(s): LIPASE, AMYLASE in the  last 168 hours. No results for input(s): AMMONIA in the last 168 hours. CBC:  Recent Labs Lab 01/09/15 2047 01/10/15 0428 01/10/15 1405  WBC 5.3 6.6 6.0  NEUTROABS 3.5  --   --   HGB 13.0 13.3 13.4  HCT 40.6 41.6 42.8  MCV 103.8* 104.0* 105.4*  PLT 192 179 180   Cardiac Enzymes: No results for input(s): CKTOTAL, CKMB, CKMBINDEX, TROPONINI in the last 168 hours. BNP: BNP (last 3 results) No results for input(s): BNP in the last 8760 hours.  ProBNP (last 3 results) No results for input(s): PROBNP in the last 8760 hours.  CBG:  Recent Labs Lab 01/10/15 0747 01/11/15 0718  GLUCAP 90 101*   Signed:  Marwin Primmer K  [facility] 01/11/2015, 9:47 AM

## 2015-01-17 ENCOUNTER — Emergency Department (HOSPITAL_COMMUNITY): Payer: Commercial Managed Care - HMO

## 2015-01-17 ENCOUNTER — Encounter (HOSPITAL_COMMUNITY): Payer: Self-pay | Admitting: Emergency Medicine

## 2015-01-17 ENCOUNTER — Inpatient Hospital Stay (HOSPITAL_COMMUNITY)
Admission: EM | Admit: 2015-01-17 | Discharge: 2015-01-19 | DRG: 871 | Disposition: A | Discharge status: Home Health | Payer: Commercial Managed Care - HMO | Attending: Internal Medicine | Admitting: Internal Medicine

## 2015-01-17 DIAGNOSIS — J45909 Unspecified asthma, uncomplicated: Secondary | ICD-10-CM | POA: Diagnosis present

## 2015-01-17 DIAGNOSIS — Z23 Encounter for immunization: Secondary | ICD-10-CM

## 2015-01-17 DIAGNOSIS — E46 Unspecified protein-calorie malnutrition: Secondary | ICD-10-CM | POA: Diagnosis not present

## 2015-01-17 DIAGNOSIS — R05 Cough: Secondary | ICD-10-CM | POA: Diagnosis present

## 2015-01-17 DIAGNOSIS — F17210 Nicotine dependence, cigarettes, uncomplicated: Secondary | ICD-10-CM | POA: Diagnosis present

## 2015-01-17 DIAGNOSIS — R059 Cough, unspecified: Secondary | ICD-10-CM | POA: Diagnosis present

## 2015-01-17 DIAGNOSIS — T17890A Other foreign object in other parts of respiratory tract causing asphyxiation, initial encounter: Secondary | ICD-10-CM | POA: Diagnosis present

## 2015-01-17 DIAGNOSIS — M4696 Unspecified inflammatory spondylopathy, lumbar region: Secondary | ICD-10-CM | POA: Diagnosis not present

## 2015-01-17 DIAGNOSIS — J441 Chronic obstructive pulmonary disease with (acute) exacerbation: Secondary | ICD-10-CM | POA: Diagnosis present

## 2015-01-17 DIAGNOSIS — Z86718 Personal history of other venous thrombosis and embolism: Secondary | ICD-10-CM | POA: Diagnosis not present

## 2015-01-17 DIAGNOSIS — M549 Dorsalgia, unspecified: Secondary | ICD-10-CM

## 2015-01-17 DIAGNOSIS — H5347 Heteronymous bilateral field defects: Secondary | ICD-10-CM | POA: Diagnosis present

## 2015-01-17 DIAGNOSIS — N390 Urinary tract infection, site not specified: Secondary | ICD-10-CM | POA: Diagnosis present

## 2015-01-17 DIAGNOSIS — R569 Unspecified convulsions: Secondary | ICD-10-CM | POA: Diagnosis not present

## 2015-01-17 DIAGNOSIS — R0602 Shortness of breath: Secondary | ICD-10-CM

## 2015-01-17 DIAGNOSIS — Z7982 Long term (current) use of aspirin: Secondary | ICD-10-CM

## 2015-01-17 DIAGNOSIS — A419 Sepsis, unspecified organism: Secondary | ICD-10-CM | POA: Diagnosis not present

## 2015-01-17 DIAGNOSIS — M545 Low back pain: Secondary | ICD-10-CM | POA: Diagnosis not present

## 2015-01-17 DIAGNOSIS — J690 Pneumonitis due to inhalation of food and vomit: Secondary | ICD-10-CM | POA: Diagnosis not present

## 2015-01-17 DIAGNOSIS — R509 Fever, unspecified: Secondary | ICD-10-CM | POA: Diagnosis present

## 2015-01-17 DIAGNOSIS — J449 Chronic obstructive pulmonary disease, unspecified: Secondary | ICD-10-CM | POA: Diagnosis present

## 2015-01-17 DIAGNOSIS — F1010 Alcohol abuse, uncomplicated: Secondary | ICD-10-CM | POA: Diagnosis not present

## 2015-01-17 DIAGNOSIS — E440 Moderate protein-calorie malnutrition: Secondary | ICD-10-CM | POA: Diagnosis not present

## 2015-01-17 DIAGNOSIS — G40909 Epilepsy, unspecified, not intractable, without status epilepticus: Secondary | ICD-10-CM | POA: Diagnosis not present

## 2015-01-17 DIAGNOSIS — X58XXXA Exposure to other specified factors, initial encounter: Secondary | ICD-10-CM | POA: Diagnosis present

## 2015-01-17 DIAGNOSIS — Z79899 Other long term (current) drug therapy: Secondary | ICD-10-CM | POA: Diagnosis not present

## 2015-01-17 DIAGNOSIS — M47814 Spondylosis without myelopathy or radiculopathy, thoracic region: Secondary | ICD-10-CM | POA: Diagnosis not present

## 2015-01-17 DIAGNOSIS — Z8673 Personal history of transient ischemic attack (TIA), and cerebral infarction without residual deficits: Secondary | ICD-10-CM | POA: Diagnosis not present

## 2015-01-17 DIAGNOSIS — G8929 Other chronic pain: Secondary | ICD-10-CM | POA: Diagnosis present

## 2015-01-17 DIAGNOSIS — R0902 Hypoxemia: Secondary | ICD-10-CM | POA: Diagnosis present

## 2015-01-17 DIAGNOSIS — F17200 Nicotine dependence, unspecified, uncomplicated: Secondary | ICD-10-CM | POA: Diagnosis present

## 2015-01-17 DIAGNOSIS — K219 Gastro-esophageal reflux disease without esophagitis: Secondary | ICD-10-CM | POA: Diagnosis not present

## 2015-01-17 DIAGNOSIS — Z681 Body mass index (BMI) 19 or less, adult: Secondary | ICD-10-CM

## 2015-01-17 DIAGNOSIS — J9811 Atelectasis: Secondary | ICD-10-CM | POA: Diagnosis not present

## 2015-01-17 DIAGNOSIS — G4089 Other seizures: Secondary | ICD-10-CM

## 2015-01-17 DIAGNOSIS — Z8711 Personal history of peptic ulcer disease: Secondary | ICD-10-CM

## 2015-01-17 HISTORY — DX: Chronic obstructive pulmonary disease, unspecified: J44.9

## 2015-01-17 HISTORY — DX: Sepsis, unspecified organism: A41.9

## 2015-01-17 HISTORY — DX: Sepsis, unspecified organism: N39.0

## 2015-01-17 LAB — BASIC METABOLIC PANEL
ANION GAP: 10 (ref 5–15)
BUN: 8 mg/dL (ref 6–20)
CHLORIDE: 101 mmol/L (ref 101–111)
CO2: 25 mmol/L (ref 22–32)
Calcium: 8.9 mg/dL (ref 8.9–10.3)
Creatinine, Ser: 0.48 mg/dL (ref 0.44–1.00)
GFR calc Af Amer: >60 mL/min (ref >60)
GFR calc non Af Amer: >60 mL/min (ref >60)
GLUCOSE: 151 mg/dL — AB (ref 65–99)
POTASSIUM: 3.9 mmol/L (ref 3.5–5.1)
Sodium: 136 mmol/L (ref 135–145)

## 2015-01-17 LAB — CBC WITH DIFFERENTIAL/PLATELET
Basophils Absolute: 0 10*3/uL (ref 0.0–0.1)
Basophils Relative: 0 % (ref 0–1)
Eosinophils Absolute: 0 10*3/uL (ref 0.0–0.7)
Eosinophils Relative: 0 % (ref 0–5)
HCT: 37.4 % (ref 36.0–46.0)
HEMOGLOBIN: 12.1 g/dL (ref 12.0–15.0)
Lymphocytes Relative: 5 % — ABNORMAL LOW (ref 12–46)
Lymphs Abs: 0.5 10*3/uL — ABNORMAL LOW (ref 0.7–4.0)
MCH: 33.3 pg (ref 26.0–34.0)
MCHC: 32.4 g/dL (ref 30.0–36.0)
MCV: 103 fL — ABNORMAL HIGH (ref 78.0–100.0)
Monocytes Absolute: 0.3 10*3/uL (ref 0.1–1.0)
Monocytes Relative: 4 % (ref 3–12)
Neutro Abs: 7.6 10*3/uL (ref 1.7–7.7)
Neutrophils Relative %: 91 % — ABNORMAL HIGH (ref 43–77)
PLATELETS: 200 10*3/uL (ref 150–400)
RBC: 3.63 MIL/uL — ABNORMAL LOW (ref 3.87–5.11)
RDW: 12.6 % (ref 11.5–15.5)
WBC: 8.4 10*3/uL (ref 4.0–10.5)

## 2015-01-17 LAB — CBG MONITORING, ED: GLUCOSE-CAPILLARY: 143 mg/dL — AB (ref 65–99)

## 2015-01-17 LAB — TROPONIN I: Troponin I: <0.03 ng/mL (ref <0.031)

## 2015-01-17 MED ORDER — PIPERACILLIN-TAZOBACTAM 3.375 G IVPB
3.3750 g | Freq: Three times a day (TID) | INTRAVENOUS | Status: DC
  Administered 2015-01-17 – 2015-01-19 (×5): 3.375 g via INTRAVENOUS
  Filled 2015-01-17 (×4): qty 50

## 2015-01-17 MED ORDER — SODIUM CHLORIDE 0.9 % IV BOLUS (SEPSIS)
1000.0000 mL | Freq: Once | INTRAVENOUS | Status: AC
  Administered 2015-01-17: 1000 mL via INTRAVENOUS

## 2015-01-17 MED ORDER — VANCOMYCIN HCL IN DEXTROSE 1-5 GM/200ML-% IV SOLN
1000.0000 mg | Freq: Once | INTRAVENOUS | Status: AC
  Administered 2015-01-17: 1000 mg via INTRAVENOUS
  Filled 2015-01-17: qty 200

## 2015-01-17 MED ORDER — VANCOMYCIN HCL IN DEXTROSE 750-5 MG/150ML-% IV SOLN
750.0000 mg | Freq: Two times a day (BID) | INTRAVENOUS | Status: DC
  Administered 2015-01-18 (×2): 750 mg via INTRAVENOUS
  Filled 2015-01-17 (×3): qty 150

## 2015-01-17 MED ORDER — DIPHENHYDRAMINE HCL 50 MG/ML IJ SOLN
25.0000 mg | Freq: Once | INTRAMUSCULAR | Status: AC
  Administered 2015-01-17: 25 mg via INTRAVENOUS
  Filled 2015-01-17: qty 1

## 2015-01-17 NOTE — ED Notes (Signed)
MD at bedside. 

## 2015-01-17 NOTE — H&P (Signed)
[facility] History and Physical  [name] XBD:[id number] DOB: Dec 03, 1955 DOA: 01/17/2015  Referring physician: ED physician PCP: [name], MD  Specialists:   Chief Complaint:  Seizure, fever and cough.    HPI: [name] is a 60 y.o. female with PMH of COPD, GERD, history of DVT, asthma, cerebral hemorrhage 1989, DDD, chronic back pain, history of gastric ulcer, history of stroke, seizure, who presents with seizure and cough.   Patient is postictal and is a poor historian and unable to provide much history. Therefore, most of the history is obtained by discussing the case with the ED physician also per EMS report.  It seems that patient's mother call EMS and stating that the patient had a seizure tonight, stating that her foot hit the ground and shook. Mother states that this was typical for her.This happened again in the EMS truck. In the emergency room, patient was found to have cough, no sputum production. She has fever, but no chest pain. He reports having mild shortness of breath.  In ED, patient was found to have WBC 8.4, troponin negative, temperature 101, tachycardia, electrolytes okay. Chest x-ray showed right middle lobe complete atelectasis. Patient is admitted to inpatient for further evaluation and treatment.  Where does patient live?   At home Can patient participate in ADLs? Little   Review of Systems:   General: has fevers, chills, no changes in body weight, has poor appetite, has fatigue HEENT: no blurry vision, hearing changes or sore throat Pulm: has dyspnea, coughing, wheezing CV: no chest pain, palpitations Abd: no nausea, vomiting, abdominal pain, diarrhea, constipation GU: no dysuria, burning on urination, increased urinary frequency, hematuria  Ext: no leg edema Neuro: no unilateral weakness, numbness, or tingling, no vision change or hearing loss. Skin: no rash MSK: No muscle spasm, no deformity, no limitation of range of movement in  spin Heme: No easy bruising.  Travel history: No recent long distant travel.  Allergy:  Allergies  Allergen Reactions  . Zonisamide Other (See Comments)    Numbness and tingling over whole body  . Codeine Nausea And Vomiting  . Keppra [Levetiracetam] Other (See Comments)    Causes seizures.   [name] Oil Rash    Past Medical History  Diagnosis Date  . DVT (deep venous thrombosis)     "she's had several since 1990"  . Asthma   . Cerebral hemorrhage 1989  . Chronic back pain   . DDD (degenerative disc disease) 06/12/2013  . Kidney stones   . Chronic bronchitis     "gets it q yr"  . History of blood transfusion     "when she was a baby"  . History of stomach ulcers   . GERD (gastroesophageal reflux disease)   . Headache     "usually around time when she's had a seizure"  . Seizures     "because of her brain surgery"  . Stroke 1990's    family denies residual on 11/09/2014  . Tunnel vision     "since brain OR"  . COPD (chronic obstructive pulmonary disease)     Past Surgical History  Procedure Laterality Date  . Cesarean section  8341; 1987; 1989  . Brain surgery  1989    craniotomy with hematoma evacuation  . Hemorrhoid surgery    . Tubal ligation  1990's    Social History:  reports that she has been smoking Cigarettes.  She has a 60 pack-year smoking history. She has never used smokeless tobacco.  She reports that she does not drink alcohol or use illicit drugs.  Family History:  Family History  Problem Relation Age of Onset  . Heart attack Mother   . Heart attack Father      Prior to Admission medications   Medication Sig Start Date End Date Taking? Authorizing Provider  aspirin 81 MG chewable tablet Chew 324 mg by mouth once.   Yes Historical Provider, MD  lacosamide (VIMPAT) 200 MG TABS tablet Take 200 mg by mouth 2 (two) times daily.   Yes Historical Provider, MD  LORazepam (ATIVAN) 0.5 MG tablet Take 1 tablet by mouth as needed. For panic attacks. Max  dose of 2 tabs in 24 hours. 12/13/14  Yes Historical Provider, MD  phenytoin (DILANTIN) 100 MG ER capsule Take 100-200 mg by mouth 2 (two) times daily. 1 capsule in the morning and 2 capsules at bedtime.   Yes Historical Provider, MD  predniSONE (DELTASONE) 5 MG tablet Take 1 tablet (5 mg total) by mouth daily with breakfast. 01/11/15  Yes Donne Hazel, MD  VENTOLIN HFA 108 (90 BASE) MCG/ACT inhaler Inhale 2 puffs into the lungs 4 (four) times daily as needed. 10/17/14  Yes Historical Provider, MD  Vitamin D, Ergocalciferol, (DRISDOL) 50000 UNITS CAPS capsule Take 50,000 Units by mouth once a week. On Wednesdays 12/15/14  Yes Historical Provider, MD    Physical Exam: Filed Vitals:   01/18/15 0400 01/18/15 0417 01/18/15 0500 01/18/15 0600  BP: 104/43  104/51 100/54  Pulse: 84  88 92  Temp:      TempSrc:      Resp: 29  28 20   Height:      Weight:      SpO2: 96% 98% 96% 89%   General: Not in acute distress, somnolent. HEENT:       Eyes: PERRL, EOMI, no scleral icterus.       ENT: No discharge from the ears and nose, no pharynx injection, no tonsillar enlargement.        Neck: No JVD, no bruit, no mass felt. Heme: No neck lymph node enlargement. Cardiac: S1/S2, RRR, No murmurs, No gallops or rubs. Pulm: bilateral wheezing. No rale or rubs. Abd: Soft, nondistended, nontender, no rebound pain, no organomegaly, BS present. Ext: No pitting leg edema bilaterally. 2+DP/PT pulse bilaterally. Musculoskeletal: No joint deformities, No joint redness or warmth, no limitation of ROM in spin. Skin: No rashes.  Neuro: Alert, drowsy, oriented to place, but not to time and person, cranial nerves II-XII grossly intact, muscle strength 5/5 in all extremities, sensation to light touch intact. Brachial reflex 1+ bilaterally. Knee reflex 1+ bilaterally.  Psych: Patient is not psychotic, no suicidal or hemocidal ideation.  Labs on Admission:  Basic Metabolic Panel:  Recent Labs Lab 01/17/15 2218  NA 136   K 3.9  CL 101  CO2 25  GLUCOSE 151*  BUN 8  CREATININE 0.48  CALCIUM 8.9   Liver Function Tests: No results for input(s): AST, ALT, ALKPHOS, BILITOT, PROT, ALBUMIN in the last 168 hours. No results for input(s): LIPASE, AMYLASE in the last 168 hours. No results for input(s): AMMONIA in the last 168 hours. CBC:  Recent Labs Lab 01/17/15 2218  WBC 8.4  NEUTROABS 7.6  HGB 12.1  HCT 37.4  MCV 103.0*  PLT 200   Cardiac Enzymes:  Recent Labs Lab 01/17/15 2218  TROPONINI <0.03    BNP (last 3 results) No results for input(s): BNP in the last 8760 hours.  ProBNP (last  3 results) No results for input(s): PROBNP in the last 8760 hours.  CBG:  Recent Labs Lab 01/11/15 0718 01/17/15 2107  GLUCAP 101* 143*    Radiological Exams on Admission: Dg Chest 2 View (if Patient Has Fever And/or Copd)  01/17/2015   CLINICAL DATA:  Productive cough, wheezing, and shortness of breath. Seizures today.  EXAM: CHEST  2 VIEW  COMPARISON:  01/10/2015 and 02/27/2012 and chest CT dated 05/28/2007  FINDINGS: On the lateral view there appears to be complete atelectasis of the right middle lobe which is demonstrated be relatively small on the chest CT. Lungs are hyperinflated consistent with emphysema. Heart size is normal. No effusions. No acute osseous abnormality.  IMPRESSION: There appears to be complete atelectasis of the right middle lobe. I suspect the patient has mucous plugging of the right middle lobe bronchus but follow-up is recommended to exclude persistent atelectasis due to an obstructing mass. COPD.   Electronically Signed   By: Lorriane Shire M.D.   On: 01/17/2015 21:18    EKG: Not done in ED, will get one.   Assessment/Plan Principal Problem:   Cough Active Problems:   Tobacco dependence   Recurrent seizures   Asthma   Seizure   Protein calorie malnutrition   COPD (chronic obstructive pulmonary disease)   Fever   Sepsis   Hypoxia   Chronic obstructive pulmonary  disease with acute exacerbation  Cough, COPD exacerbation and sepsis: Patient is a shortness of breath, cough and wheezing on auscultation are consistency with COPD exacerbation. Chest x-ray showed right middle lobe complete atelectasis, indicating possible plug or aspiration. Patient is septic on admission with fever and tachycardia. Hemodynamically stable. -will admit to sdu -Nebulizers: scheduled Duoneb and prn albuterol -Solu-Medrol 60 mg IV bid -Vancomycin and zosyn -Mucinex for cough  -Urine legionella and S. pneumococcal antigen -Follow up blood culture x2, sputum culture Flu pcr --will get Procalcitonin and trend lactic acid levels per sepsis protocol. -IVF: 2.5L of NS bolus in ED, followed by 125 cc/h   Recurrent Seizure: Patient is taking Dilantin and vimpat at home. The regarding the seizure is most likely induced by sepsis. -Continue Dilantin -check Dilantin level -Ativan when necessary  Tobacco abuse and Alcohol abuse: -Did counseling about importance of quitting smoking -Nicotine patch  Protein calorie malnutrition: -Ensure  DVT ppx: SQ Heparin     Code Status: Full code Family Communication: None at bed side.      Disposition Plan: Admit to inpatient   Date of Service 01/18/2015    Ivor Costa [facility] Pager [phone]  If 7PM-7AM, please contact night-coverage [email or web address] Password [facility] 01/18/2015, 6:55 AM

## 2015-01-17 NOTE — ED Notes (Addendum)
Pt stays with her mother who called EMS tonight stating that the patient had a seizure. Mother states that this was typical for her, stating that her foot hit the ground and shook, indicating a possible focal seizure. Pt was able to follow commands when this again happened in the EMS truck. Given 125mg  solumedrol IV en route and 5mg  albuterol tx. Pt is somewhat alert at this time and is able to answer date and who she is.

## 2015-01-17 NOTE — ED Provider Notes (Signed)
CSN: [id number]     Arrival date & time 01/17/15  2017 History   First MD Initiated Contact with Patient 01/17/15 2136     Chief Complaint  Patient presents with  . Seizures  . Wheezing     (Consider location/radiation/quality/duration/timing/severity/associated sxs/prior Treatment) Patient is a 59 y.o. female presenting with seizures and general illness.  Seizures Seizure type:  Focal Initial focality: right foot, suspected. Episode characteristics comment:  Pt unable to describe.  EMS reports tapping of foot. Postictal symptoms: memory loss   Severity:  Moderate Timing:  Clustered Progression:  Resolved Context: fever and previous head injury   History of seizures: yes   Illness Quality:  Fever, cough Severity:  Severe Onset quality:  Gradual Duration:  2 days Timing:  Constant Progression:  Worsening Chronicity:  New Context:  Recent hospitalization  Relieved by:  Nothing Associated symptoms: fever and shortness of breath   Associated symptoms: no chest pain   Associated symptoms comment:  Nonproductive cough   Past Medical History  Diagnosis Date  . DVT (deep venous thrombosis)     "she's had several since 1990"  . Asthma   . Cerebral hemorrhage 1989  . Chronic back pain   . DDD (degenerative disc disease) 06/12/2013  . Kidney stones   . Chronic bronchitis     "gets it q yr"  . History of blood transfusion     "when she was a baby"  . History of stomach ulcers   . GERD (gastroesophageal reflux disease)   . Headache     "usually around time when she's had a seizure"  . Seizures     "because of her brain surgery"  . Stroke 1990's    family denies residual on 11/09/2014  . Tunnel vision     "since brain OR"   Past Surgical History  Procedure Laterality Date  . Cesarean section  9509; 1987; 1989  . Brain surgery  1989    craniotomy with hematoma evacuation  . Hemorrhoid surgery    . Tubal ligation  1990's   Family History  Problem Relation Age of  Onset  . Heart attack Mother   . Heart attack Father    History  Substance Use Topics  . Smoking status: Current Every Day Smoker -- 1.50 packs/day for 40 years    Types: Cigarettes  . Smokeless tobacco: Never Used  . Alcohol Use: No   OB History    No data available     Review of Systems  Constitutional: Positive for fever.  Respiratory: Positive for shortness of breath.   Cardiovascular: Negative for chest pain.  Neurological: Positive for seizures.  All other systems reviewed and are negative.     Allergies  Zonisamide; Codeine; Keppra; and Olive oil  Home Medications   Prior to Admission medications   Medication Sig Start Date End Date Taking? Authorizing Provider  aspirin 81 MG chewable tablet Chew 324 mg by mouth once.   Yes Historical Provider, MD  lacosamide (VIMPAT) 200 MG TABS tablet Take 200 mg by mouth 2 (two) times daily.   Yes Historical Provider, MD  LORazepam (ATIVAN) 0.5 MG tablet Take 1 tablet by mouth as needed. For panic attacks. Max dose of 2 tabs in 24 hours. 12/13/14  Yes Historical Provider, MD  phenytoin (DILANTIN) 100 MG ER capsule Take 100-200 mg by mouth 2 (two) times daily. 1 capsule in the morning and 2 capsules at bedtime.   Yes Historical Provider, MD  predniSONE (DELTASONE) 5  MG tablet Take 1 tablet (5 mg total) by mouth daily with breakfast. 01/11/15  Yes Donne Hazel, MD  VENTOLIN HFA 108 (90 BASE) MCG/ACT inhaler Inhale 2 puffs into the lungs 4 (four) times daily as needed. 10/17/14  Yes Historical Provider, MD  Vitamin D, Ergocalciferol, (DRISDOL) 50000 UNITS CAPS capsule Take 50,000 Units by mouth once a week. On Wednesdays 12/15/14  Yes Historical Provider, MD   BP 115/65 mmHg  Pulse 108  Temp(Src) 101 F (38.3 C) (Oral)  SpO2 90% Physical Exam  Constitutional: She is oriented to person, place, and time. She appears well-developed and well-nourished. No distress.  HENT:  Head: Normocephalic and atraumatic.  Mouth/Throat: Oropharynx  is clear and moist.  Eyes: Conjunctivae are normal. Pupils are equal, round, and reactive to light. No scleral icterus.  Neck: Neck supple.  Cardiovascular: Normal rate, regular rhythm, normal heart sounds and intact distal pulses.   No murmur heard. Pulmonary/Chest: Effort normal and breath sounds normal. No stridor. Tachypnea noted. No respiratory distress. She has no rales.  Mild diffuse wheezing, worse on right.  Breath sounds more prominent on right compared to left.   Abdominal: Soft. Bowel sounds are normal. She exhibits no distension. There is no tenderness.  Musculoskeletal: Normal range of motion.  Neurological: She is alert and oriented to person, place, and time.  Skin: Skin is warm and dry. No rash noted.  Psychiatric: She has a normal mood and affect. Her behavior is normal.  Nursing note and vitals reviewed.   ED Course  CRITICAL CARE Performed by: Serita Grit Authorized by: Serita Grit Total critical care time: 35 minutes Critical care time was exclusive of separately billable procedures and treating other patients. Critical care was necessary to treat or prevent imminent or life-threatening deterioration of the following conditions: respiratory failure. Critical care was time spent personally by me on the following activities: development of treatment plan with patient or surrogate, discussions with consultants, evaluation of patient's response to treatment, examination of patient, obtaining history from patient or surrogate, ordering and performing treatments and interventions, ordering and review of laboratory studies, ordering and review of radiographic studies, pulse oximetry, re-evaluation of patient's condition and review of old charts.   (including critical care time) Labs Review Labs Reviewed  CBG MONITORING, ED - Abnormal; Notable for the following:    Glucose-Capillary 143 (*)    All other components within normal limits    Imaging Review Dg Chest 2  View (if Patient Has Fever And/or Copd)  01/17/2015   CLINICAL DATA:  Productive cough, wheezing, and shortness of breath. Seizures today.  EXAM: CHEST  2 VIEW  COMPARISON:  01/10/2015 and 02/27/2012 and chest CT dated 05/28/2007  FINDINGS: On the lateral view there appears to be complete atelectasis of the right middle lobe which is demonstrated be relatively small on the chest CT. Lungs are hyperinflated consistent with emphysema. Heart size is normal. No effusions. No acute osseous abnormality.  IMPRESSION: There appears to be complete atelectasis of the right middle lobe. I suspect the patient has mucous plugging of the right middle lobe bronchus but follow-up is recommended to exclude persistent atelectasis due to an obstructing mass. COPD.   Electronically Signed   By: Lorriane Shire M.D.   On: 01/17/2015 21:18  All radiology studies independently viewed by me.      EKG Interpretation None      MDM   Final diagnoses:  Chronic obstructive pulmonary disease with acute exacerbation  SOB (shortness of breath)  hypoxia seizure  59 yo female with hx of seizures and asthma presenting with increased seizure activity and fever/cough/hypoxis.  She has evidence of right middle lobe collapse on CXR.  Covered empirically for HCAP.  She required 4L of [place] oxygen.  She was given Solumedrol by EMS.  Admitted to stepdown unit for continued treatment of hypoxic respiratory failure.      Serita Grit, MD 01/18/15 1302

## 2015-01-17 NOTE — ED Notes (Signed)
Bed: WA02 Expected date:  Expected time:  Means of arrival:  Comments: EMS 59 yo female seizure/post-ictal/hx seizure/exp wheezing/neb

## 2015-01-17 NOTE — Progress Notes (Signed)
ANTIBIOTIC CONSULT NOTE - INITIAL  Pharmacy Consult for Vancomycin/Zosyn Indication: pneumonia  Allergies  Allergen Reactions  . Zonisamide Other (See Comments)    Numbness and tingling over whole body  . Codeine Nausea And Vomiting  . Keppra [Levetiracetam] Other (See Comments)    Causes seizures.   [name] Oil Rash    Patient Measurements:   Height: 67 inches Weight: 50.8 kg  Vital Signs: Temp: 101 F (38.3 C) (06/15 2032) Temp Source: Oral (06/15 2032) BP: 87/63 mmHg (06/15 2145) Pulse Rate: 101 (06/15 2150) Intake/Output from previous day:   Intake/Output from this shift:    Labs:  Recent Labs  01/17/15 2218  WBC 8.4  HGB 12.1  PLT 200  CREATININE 0.48   Estimated Creatinine Clearance: 63 mL/min (by C-G formula based on Cr of 0.48). No results for input(s): VANCOTROUGH, VANCOPEAK, VANCORANDOM, GENTTROUGH, GENTPEAK, GENTRANDOM, TOBRATROUGH, TOBRAPEAK, TOBRARND, AMIKACINPEAK, AMIKACINTROU, AMIKACIN in the last 72 hours.   Microbiology: No results found for this or any previous visit (from the past 720 hour(s)).  Medical History: Past Medical History  Diagnosis Date  . DVT (deep venous thrombosis)     "she's had several since 1990"  . Asthma   . Cerebral hemorrhage 1989  . Chronic back pain   . DDD (degenerative disc disease) 06/12/2013  . Kidney stones   . Chronic bronchitis     "gets it q yr"  . History of blood transfusion     "when she was a baby"  . History of stomach ulcers   . GERD (gastroesophageal reflux disease)   . Headache     "usually around time when she's had a seizure"  . Seizures     "because of her brain surgery"  . Stroke 1990's    family denies residual on 11/09/2014  . Tunnel vision     "since brain OR"    Medications:  Scheduled:  . [START ON 01/18/2015] vancomycin  750 mg Intravenous Q12H   Infusions:  . piperacillin-tazobactam (ZOSYN)  IV Stopped (01/17/15 2307)  . vancomycin 1,000 mg (01/17/15 2307)    Assessment:  59 yr female presents with seizure and wheezing  Pharmacy consulted for dosing Vancomycin and Zosyn for treatment of HCAP  CrCl ~ 63 ml/min  Blood cultures ordered  Goal of Therapy:  Vancomycin trough level 15-20 mcg/ml  Plan:  Measure antibiotic drug levels at steady state Follow up culture results   Vancomycin 1gm IV x 1 followed by 750mg  IV q12h  Zosyn 3.375gm IV q8h (each dose infused over 4 hrs)  Yves Fodor, Toribio Harbour, PharmD 01/17/2015,11:37 PM

## 2015-01-18 ENCOUNTER — Inpatient Hospital Stay (HOSPITAL_COMMUNITY): Payer: Commercial Managed Care - HMO

## 2015-01-18 ENCOUNTER — Encounter (HOSPITAL_COMMUNITY): Payer: Self-pay

## 2015-01-18 DIAGNOSIS — J441 Chronic obstructive pulmonary disease with (acute) exacerbation: Secondary | ICD-10-CM | POA: Insufficient documentation

## 2015-01-18 DIAGNOSIS — F17210 Nicotine dependence, cigarettes, uncomplicated: Secondary | ICD-10-CM | POA: Diagnosis present

## 2015-01-18 DIAGNOSIS — R0902 Hypoxemia: Secondary | ICD-10-CM | POA: Diagnosis present

## 2015-01-18 DIAGNOSIS — Z86718 Personal history of other venous thrombosis and embolism: Secondary | ICD-10-CM | POA: Diagnosis not present

## 2015-01-18 DIAGNOSIS — Z79899 Other long term (current) drug therapy: Secondary | ICD-10-CM | POA: Diagnosis not present

## 2015-01-18 DIAGNOSIS — T17890A Other foreign object in other parts of respiratory tract causing asphyxiation, initial encounter: Secondary | ICD-10-CM | POA: Diagnosis present

## 2015-01-18 DIAGNOSIS — R569 Unspecified convulsions: Secondary | ICD-10-CM | POA: Diagnosis not present

## 2015-01-18 DIAGNOSIS — H5347 Heteronymous bilateral field defects: Secondary | ICD-10-CM | POA: Diagnosis present

## 2015-01-18 DIAGNOSIS — M545 Low back pain: Secondary | ICD-10-CM | POA: Diagnosis present

## 2015-01-18 DIAGNOSIS — A419 Sepsis, unspecified organism: Secondary | ICD-10-CM | POA: Diagnosis present

## 2015-01-18 DIAGNOSIS — R05 Cough: Secondary | ICD-10-CM

## 2015-01-18 DIAGNOSIS — G40909 Epilepsy, unspecified, not intractable, without status epilepticus: Secondary | ICD-10-CM | POA: Diagnosis present

## 2015-01-18 DIAGNOSIS — F1010 Alcohol abuse, uncomplicated: Secondary | ICD-10-CM | POA: Diagnosis present

## 2015-01-18 DIAGNOSIS — R509 Fever, unspecified: Secondary | ICD-10-CM | POA: Diagnosis not present

## 2015-01-18 DIAGNOSIS — J690 Pneumonitis due to inhalation of food and vomit: Secondary | ICD-10-CM | POA: Diagnosis present

## 2015-01-18 DIAGNOSIS — Z7982 Long term (current) use of aspirin: Secondary | ICD-10-CM | POA: Diagnosis not present

## 2015-01-18 DIAGNOSIS — J9811 Atelectasis: Secondary | ICD-10-CM | POA: Diagnosis present

## 2015-01-18 DIAGNOSIS — K219 Gastro-esophageal reflux disease without esophagitis: Secondary | ICD-10-CM | POA: Diagnosis present

## 2015-01-18 DIAGNOSIS — J45909 Unspecified asthma, uncomplicated: Secondary | ICD-10-CM | POA: Diagnosis present

## 2015-01-18 DIAGNOSIS — Z681 Body mass index (BMI) 19 or less, adult: Secondary | ICD-10-CM | POA: Diagnosis not present

## 2015-01-18 DIAGNOSIS — Z8673 Personal history of transient ischemic attack (TIA), and cerebral infarction without residual deficits: Secondary | ICD-10-CM | POA: Diagnosis not present

## 2015-01-18 DIAGNOSIS — G8929 Other chronic pain: Secondary | ICD-10-CM | POA: Diagnosis present

## 2015-01-18 DIAGNOSIS — E440 Moderate protein-calorie malnutrition: Secondary | ICD-10-CM | POA: Diagnosis present

## 2015-01-18 DIAGNOSIS — Z23 Encounter for immunization: Secondary | ICD-10-CM | POA: Diagnosis not present

## 2015-01-18 DIAGNOSIS — X58XXXA Exposure to other specified factors, initial encounter: Secondary | ICD-10-CM | POA: Diagnosis present

## 2015-01-18 DIAGNOSIS — Z8711 Personal history of peptic ulcer disease: Secondary | ICD-10-CM | POA: Diagnosis not present

## 2015-01-18 HISTORY — DX: Hypoxemia: R09.02

## 2015-01-18 LAB — ALBUMIN: Albumin: 3 g/dL — ABNORMAL LOW (ref 3.5–5.0)

## 2015-01-18 LAB — INFLUENZA PANEL BY PCR (TYPE A & B)
H1N1 flu by pcr: NOT DETECTED
INFLBPCR: NEGATIVE
Influenza A By PCR: NEGATIVE

## 2015-01-18 LAB — EXPECTORATED SPUTUM ASSESSMENT W GRAM STAIN, RFLX TO RESP C

## 2015-01-18 LAB — LACTIC ACID, PLASMA
Lactic Acid, Venous: 1.3 mmol/L (ref 0.5–2.0)
Lactic Acid, Venous: 0.6 mmol/L (ref 0.5–2.0)

## 2015-01-18 LAB — STREP PNEUMONIAE URINARY ANTIGEN: STREP PNEUMO URINARY ANTIGEN: POSITIVE — AB

## 2015-01-18 LAB — PROCALCITONIN

## 2015-01-18 LAB — URINALYSIS, ROUTINE W REFLEX MICROSCOPIC
BILIRUBIN URINE: NEGATIVE
Glucose, UA: NEGATIVE mg/dL
Hgb urine dipstick: NEGATIVE
Ketones, ur: NEGATIVE mg/dL
Leukocytes, UA: NEGATIVE
Nitrite: NEGATIVE
PH: 7 (ref 5.0–8.0)
Protein, ur: NEGATIVE mg/dL
Specific Gravity, Urine: 1.009 (ref 1.005–1.030)
Urobilinogen, UA: 0.2 mg/dL (ref 0.0–1.0)

## 2015-01-18 LAB — PROTIME-INR
INR: 1.21 (ref 0.00–1.49)
Prothrombin Time: 15.5 seconds — ABNORMAL HIGH (ref 11.6–15.2)

## 2015-01-18 LAB — PHENYTOIN LEVEL, TOTAL
Phenytoin Lvl: 3.8 ug/mL — ABNORMAL LOW (ref 10.0–20.0)
Phenytoin Lvl: 20.4 ug/mL — ABNORMAL HIGH (ref 10.0–20.0)

## 2015-01-18 LAB — MRSA PCR SCREENING: MRSA by PCR: NEGATIVE

## 2015-01-18 LAB — EXPECTORATED SPUTUM ASSESSMENT W REFEX TO RESP CULTURE

## 2015-01-18 LAB — APTT: APTT: 31 s (ref 24–37)

## 2015-01-18 MED ORDER — ENSURE ENLIVE PO LIQD
237.0000 mL | Freq: Three times a day (TID) | ORAL | Status: DC
  Administered 2015-01-18 – 2015-01-19 (×2): 237 mL via ORAL

## 2015-01-18 MED ORDER — SODIUM CHLORIDE 0.9 % IV BOLUS (SEPSIS)
500.0000 mL | Freq: Once | INTRAVENOUS | Status: AC
  Administered 2015-01-18: 500 mL via INTRAVENOUS

## 2015-01-18 MED ORDER — PHENYTOIN SODIUM EXTENDED 100 MG PO CAPS: 100.0000 mg | ORAL_CAPSULE | Freq: Every day | ORAL | Status: DC

## 2015-01-18 MED ORDER — LORAZEPAM 0.5 MG PO TABS
0.5000 mg | ORAL_TABLET | Freq: Once | ORAL | Status: AC
  Administered 2015-01-18: 0.5 mg via ORAL
  Filled 2015-01-18: qty 1

## 2015-01-18 MED ORDER — PHENYTOIN SODIUM EXTENDED 100 MG PO CAPS
200.0000 mg | ORAL_CAPSULE | Freq: Every day | ORAL | Status: DC
  Administered 2015-01-18: 200 mg via ORAL
  Filled 2015-01-18: qty 2

## 2015-01-18 MED ORDER — ALBUTEROL SULFATE (2.5 MG/3ML) 0.083% IN NEBU: 2.5000 mg | INHALATION_SOLUTION | RESPIRATORY_TRACT | Status: DC | PRN

## 2015-01-18 MED ORDER — DM-GUAIFENESIN ER 30-600 MG PO TB12
1.0000 | ORAL_TABLET | Freq: Two times a day (BID) | ORAL | Status: DC
  Administered 2015-01-18 – 2015-01-19 (×3): 1 via ORAL
  Filled 2015-01-18 (×6): qty 1

## 2015-01-18 MED ORDER — LORAZEPAM 2 MG/ML IJ SOLN
1.0000 mg | INTRAMUSCULAR | Status: DC | PRN
Start: 2015-01-18 — End: 2015-01-19

## 2015-01-18 MED ORDER — ASPIRIN 81 MG PO CHEW: 324.0000 mg | CHEWABLE_TABLET | Freq: Once | ORAL | Status: DC

## 2015-01-18 MED ORDER — PREDNISONE 2.5 MG PO TABS
2.5000 mg | ORAL_TABLET | Freq: Every day | ORAL | Status: DC
  Administered 2015-01-19: 2.5 mg via ORAL
  Filled 2015-01-18 (×3): qty 1

## 2015-01-18 MED ORDER — METHYLPREDNISOLONE SODIUM SUCC 125 MG IJ SOLR
60.0000 mg | Freq: Once | INTRAMUSCULAR | Status: AC
  Administered 2015-01-18: 60 mg via INTRAVENOUS
  Filled 2015-01-18: qty 2

## 2015-01-18 MED ORDER — PHENYTOIN SODIUM 50 MG/ML IJ SOLN
100.0000 mg | Freq: Three times a day (TID) | INTRAMUSCULAR | Status: DC
  Administered 2015-01-18: 100 mg via INTRAVENOUS
  Filled 2015-01-18 (×3): qty 2

## 2015-01-18 MED ORDER — ENSURE ENLIVE PO LIQD
237.0000 mL | Freq: Two times a day (BID) | ORAL | Status: DC
  Administered 2015-01-18: 237 mL via ORAL

## 2015-01-18 MED ORDER — PNEUMOCOCCAL VAC POLYVALENT 25 MCG/0.5ML IJ INJ
0.5000 mL | INJECTION | INTRAMUSCULAR | Status: AC
  Administered 2015-01-19: 0.5 mL via INTRAMUSCULAR
  Filled 2015-01-18 (×2): qty 0.5

## 2015-01-18 MED ORDER — HEPARIN SODIUM (PORCINE) 5000 UNIT/ML IJ SOLN
5000.0000 [IU] | Freq: Three times a day (TID) | INTRAMUSCULAR | Status: DC
Start: 2015-01-18 — End: 2015-01-19
  Administered 2015-01-18 – 2015-01-19 (×4): 5000 [IU] via SUBCUTANEOUS
  Filled 2015-01-18 (×7): qty 1

## 2015-01-18 MED ORDER — PREDNISONE 10 MG PO TABS: 5.0000 mg | ORAL_TABLET | Freq: Every day | ORAL | Status: DC

## 2015-01-18 MED ORDER — IPRATROPIUM-ALBUTEROL 0.5-2.5 (3) MG/3ML IN SOLN
3.0000 mL | Freq: Three times a day (TID) | RESPIRATORY_TRACT | Status: DC
  Administered 2015-01-18 – 2015-01-19 (×3): 3 mL via RESPIRATORY_TRACT
  Filled 2015-01-18 (×3): qty 3

## 2015-01-18 MED ORDER — PHENYTOIN SODIUM EXTENDED 100 MG PO CAPS
100.0000 mg | ORAL_CAPSULE | Freq: Three times a day (TID) | ORAL | Status: DC
Start: 2015-01-18 — End: 2015-01-19
  Administered 2015-01-18 – 2015-01-19 (×3): 100 mg via ORAL
  Filled 2015-01-18 (×5): qty 1

## 2015-01-18 MED ORDER — LORAZEPAM 2 MG/ML IJ SOLN: 2.0000 mg | INTRAMUSCULAR | Status: DC | PRN

## 2015-01-18 MED ORDER — IPRATROPIUM-ALBUTEROL 0.5-2.5 (3) MG/3ML IN SOLN
3.0000 mL | Freq: Four times a day (QID) | RESPIRATORY_TRACT | Status: DC
  Administered 2015-01-18 (×2): 3 mL via RESPIRATORY_TRACT
  Filled 2015-01-18: qty 3

## 2015-01-18 MED ORDER — LORAZEPAM 0.5 MG PO TABS: 0.5000 mg | ORAL_TABLET | ORAL | Status: DC | PRN

## 2015-01-18 MED ORDER — SODIUM CHLORIDE 0.9 % IV SOLN
INTRAVENOUS | Status: DC
  Administered 2015-01-18: 03:00:00 via INTRAVENOUS

## 2015-01-18 MED ORDER — FOSPHENYTOIN SODIUM 500 MG PE/10ML IJ SOLN
20.0000 mg/kg | Freq: Once | INTRAMUSCULAR | Status: AC
  Administered 2015-01-18: 970 mg via INTRAVENOUS
  Filled 2015-01-18: qty 19.4

## 2015-01-18 MED ORDER — NICOTINE 21 MG/24HR TD PT24
21.0000 mg | MEDICATED_PATCH | Freq: Every day | TRANSDERMAL | Status: DC
  Administered 2015-01-18 – 2015-01-19 (×2): 21 mg via TRANSDERMAL
  Filled 2015-01-18 (×2): qty 1

## 2015-01-18 MED ORDER — IPRATROPIUM-ALBUTEROL 0.5-2.5 (3) MG/3ML IN SOLN
3.0000 mL | RESPIRATORY_TRACT | Status: DC
  Administered 2015-01-18: 3 mL via RESPIRATORY_TRACT
  Filled 2015-01-18: qty 3

## 2015-01-18 MED ORDER — HYDROCODONE-ACETAMINOPHEN 5-325 MG PO TABS
1.0000 | ORAL_TABLET | ORAL | Status: DC | PRN
  Administered 2015-01-18 – 2015-01-19 (×2): 1 via ORAL
  Filled 2015-01-18 (×2): qty 1

## 2015-01-18 MED ORDER — LACOSAMIDE 50 MG PO TABS
200.0000 mg | ORAL_TABLET | Freq: Two times a day (BID) | ORAL | Status: DC
  Administered 2015-01-18 – 2015-01-19 (×4): 200 mg via ORAL
  Filled 2015-01-18 (×4): qty 4

## 2015-01-18 MED ORDER — PHENYTOIN SODIUM EXTENDED 100 MG PO CAPS: 100.0000 mg | ORAL_CAPSULE | Freq: Two times a day (BID) | ORAL | Status: DC

## 2015-01-18 MED ORDER — SODIUM CHLORIDE 0.9 % IV BOLUS (SEPSIS)
1000.0000 mL | Freq: Once | INTRAVENOUS | Status: AC
  Administered 2015-01-18: 1000 mL via INTRAVENOUS

## 2015-01-18 MED ORDER — GUAIFENESIN 100 MG/5ML PO SOLN
200.0000 mg | Freq: Four times a day (QID) | ORAL | Status: DC | PRN
  Administered 2015-01-18 – 2015-01-19 (×3): 200 mg via ORAL
  Filled 2015-01-18 (×5): qty 10

## 2015-01-18 MED ORDER — METHYLPREDNISOLONE SODIUM SUCC 125 MG IJ SOLR: 125.0000 mg | Freq: Once | INTRAMUSCULAR | Status: DC

## 2015-01-18 NOTE — Progress Notes (Signed)
Patient Demographics:    [name], is a 59 y.o. female, DOB - May 05, 1956, GMW:[id number]  Admit date - 01/17/2015   Admitting Physician Ivor Costa, MD  Outpatient Primary MD for the patient is [name] Gravel, MD  LOS - 0   Chief Complaint  Patient presents with  . Seizures  . Wheezing        Subjective:    [name] today has, No headache, No chest pain, No abdominal pain - No Nausea, No new weakness tingling or numbness, No Cough - SOB.     Assessment  & Plan :    1. Recurrent seizure. Patient on Dilantin and Vimpat. Claims to be compliant, Dilantin levels subtherapeutic, loaded IV and switch to IV Dilantin for now. Continue Vimpat. Neuro requested to evaluate. This is her second admission in the last few days. Her antiseizure free.   2. Aspiration HCAP with right-sided mucous plugging and lung collapse. Mucous plugging and lung collapse improved after chest PT and suctioning, is likely happen during a seizure episode. Continue empiric anti-biotics, continue supportive care with oxygen nebulizer treatment and monitor cultures.   3. Tobacco abuse and alcohol abuse. Claims she has quit alcohol several years ago, counseled to quit smoking. Nicotine patch continue.   4. Moderate protein calorie malnutrition. On ensure.    5. COPD. At baseline. Taper off prednisone which was recently started on last admission, supportive care with oxygen nebulizer treatments.     Code Status : full  Family Communication  : none present  Disposition Plan  : home 1-2 days  Consults  :  Neuro  Procedures  :   DVT Prophylaxis  :    SCDs    Lab Results  Component Value Date   PLT 200 01/17/2015    Inpatient Medications  Scheduled Meds: . dextromethorphan-guaiFENesin  1 tablet Oral BID  .  feeding supplement (ENSURE ENLIVE)  237 mL Oral BID BM  . heparin  5,000 Units Subcutaneous 3 times per day  . ipratropium-albuterol  3 mL Nebulization QID  . lacosamide  200 mg Oral BID  . nicotine  21 mg Transdermal Daily  . phenytoin (DILANTIN) IV  100 mg Intravenous 3 times per day  . piperacillin-tazobactam (ZOSYN)  IV  3.375 g Intravenous 3 times per day  . [START ON 01/19/2015] pneumococcal 23 valent vaccine  0.5 mL Intramuscular Tomorrow-1000  . [START ON 01/19/2015] predniSONE  5 mg Oral Q breakfast  . vancomycin  750 mg Intravenous Q12H   Continuous Infusions:  PRN Meds:.albuterol, LORazepam, LORazepam  Antibiotics  :    Anti-infectives    Start     Dose/Rate Route Frequency Ordered Stop   01/18/15 1100  vancomycin (VANCOCIN) IVPB 750 mg/150 ml premix     750 mg 150 mL/hr over 60 Minutes Intravenous Every 12 hours 01/17/15 2336     01/17/15 2200  piperacillin-tazobactam (ZOSYN) IVPB 3.375 g     3.375 g 12.5 mL/hr over 240 Minutes Intravenous 3 times per day 01/17/15 2151     01/17/15 2200  vancomycin (VANCOCIN) IVPB 1000 mg/200 mL premix     1,000 mg 200 mL/hr over 60 Minutes Intravenous  Once 01/17/15 2151 01/18/15 0007        Objective:  Filed Vitals:   01/18/15 0600 01/18/15 0700 01/18/15 0718 01/18/15 0800  BP: 100/54 95/49  101/48  Pulse: 92 82 84 83  Temp:    98.3 F (36.8 C)  TempSrc:    Oral  Resp: 20 24 22 23   Height:      Weight:      SpO2: 89% 91% 93% 93%    Wt Readings from Last 3 Encounters:  01/18/15 48.5 kg (106 lb 14.8 oz)  01/10/15 50.803 kg (112 lb)  11/09/14 50.1 kg (110 lb 7.2 oz)     Intake/Output Summary (Last 24 hours) at 01/18/15 1010 Last data filed at 01/18/15 0100  Gross per 24 hour  Intake   1149 ml  Output    600 ml  Net    549 ml     Physical Exam  Awake Alert, Oriented X 3, No new F.N deficits, Normal affect [place].AT,PERRAL Supple Neck,No JVD, No cervical lymphadenopathy appriciated.  Symmetrical Chest wall  movement, Good air movement bilaterally, CTAB RRR,No Gallops,Rubs or new Murmurs, No Parasternal Heave +ve B.Sounds, Abd Soft, No tenderness, No organomegaly appriciated, No rebound - guarding or rigidity. No Cyanosis, Clubbing or edema, No new Rash or bruise     Data Review:   Micro Results Recent Results (from the past 240 hour(s))  MRSA PCR Screening     Status: None   Collection Time: 01/18/15 12:56 AM  Result Value Ref Range Status   MRSA by PCR NEGATIVE NEGATIVE Final    Comment:        The GeneXpert MRSA Assay (FDA approved for NASAL specimens only), is one component of a comprehensive MRSA colonization surveillance program. It is not intended to diagnose MRSA infection nor to guide or monitor treatment for MRSA infections.   Culture, sputum-assessment     Status: None   Collection Time: 01/18/15  8:35 AM  Result Value Ref Range Status   Specimen Description SPUTUM  Final   Special Requests NONE  Final   Sputum evaluation   Final    THIS SPECIMEN IS ACCEPTABLE. RESPIRATORY CULTURE REPORT TO FOLLOW.   Report Status 01/18/2015 FINAL  Final    Radiology Reports  Dg Chest 2 View (if Patient Has Fever And/or Copd)  01/17/2015   CLINICAL DATA:  Productive cough, wheezing, and shortness of breath. Seizures today.  EXAM: CHEST  2 VIEW  COMPARISON:  01/10/2015 and 02/27/2012 and chest CT dated 05/28/2007  FINDINGS: On the lateral view there appears to be complete atelectasis of the right middle lobe which is demonstrated be relatively small on the chest CT. Lungs are hyperinflated consistent with emphysema. Heart size is normal. No effusions. No acute osseous abnormality.  IMPRESSION: There appears to be complete atelectasis of the right middle lobe. I suspect the patient has mucous plugging of the right middle lobe bronchus but follow-up is recommended to exclude persistent atelectasis due to an obstructing mass. COPD.   Electronically Signed   By: Lorriane Shire M.D.   On:  01/17/2015 21:18    Dg Chest Port 1 View  01/18/2015   CLINICAL DATA:  59 year old female with cough, shortness of breath and fever  EXAM: PORTABLE CHEST - 1 VIEW  COMPARISON:  Prior chest x-ray 01/17/2015  FINDINGS: The pulmonary vascular congestion with diffusely increased interstitial markings including Kerley B-lines at the basal periphery. Findings are consistent with mild interstitial pulmonary edema. The cardiac and mediastinal contours remain within normal limits. Trace atherosclerotic calcification present within the transverse aorta. No pleural  effusion or pneumothorax. No focal airspace consolidation or infiltrate. Background bronchitic changes similar compared to prior. The lungs remain hyperinflated. No acute osseous abnormality.  IMPRESSION: 1. Increased pulmonary vascular congestion now with mild interstitial pulmonary edema. 2. Background hyperinflation and central bronchitic change remain suggestive of underlying COPD. 3. Mild aortic atherosclerosis.   Electronically Signed   By: Jacqulynn Cadet M.D.   On: 01/18/2015 08:45        CBC  Recent Labs Lab 01/17/15 2218  WBC 8.4  HGB 12.1  HCT 37.4  PLT 200  MCV 103.0*  MCH 33.3  MCHC 32.4  RDW 12.6  LYMPHSABS 0.5*  MONOABS 0.3  EOSABS 0.0  BASOSABS 0.0    Chemistries   Recent Labs Lab 01/17/15 2218  NA 136  K 3.9  CL 101  CO2 25  GLUCOSE 151*  BUN 8  CREATININE 0.48  CALCIUM 8.9   ------------------------------------------------------------------------------------------------------------------ estimated creatinine clearance is 60.1 mL/min (by C-G formula based on Cr of 0.48). ------------------------------------------------------------------------------------------------------------------ No results for input(s): HGBA1C in the last 72 hours. ------------------------------------------------------------------------------------------------------------------ No results for input(s): CHOL, HDL, LDLCALC, TRIG,  CHOLHDL, LDLDIRECT in the last 72 hours. ------------------------------------------------------------------------------------------------------------------ No results for input(s): TSH, T4TOTAL, T3FREE, THYROIDAB in the last 72 hours.  Invalid input(s): FREET3 ------------------------------------------------------------------------------------------------------------------ No results for input(s): VITAMINB12, FOLATE, FERRITIN, TIBC, IRON, RETICCTPCT in the last 72 hours.  Coagulation profile  Recent Labs Lab 01/18/15 0315  INR 1.21    No results for input(s): DDIMER in the last 72 hours.  Cardiac Enzymes  Recent Labs Lab 01/17/15 2218  TROPONINI <0.03   ------------------------------------------------------------------------------------------------------------------ Invalid input(s): POCBNP   Time Spent in minutes 35   Dink Creps K M.D on 01/18/2015 at 10:10 AM  Between 7am to 7pm - Pager - [phone]  After 7pm go to [email or web address] - password [facility]  [facility]   Office  [phone]

## 2015-01-18 NOTE — Progress Notes (Signed)
Initial Nutrition Assessment  DOCUMENTATION CODES:  Underweight, Severe malnutrition in context of chronic illness  INTERVENTION: Ensure Enlive po TID, each supplement provides 350 kcal and 20 grams of protein  Magic cup TID with meals, each supplement provides 290 kcal and 9 grams of protein  NUTRITION DIAGNOSIS:  Malnutrition related to chronic illness as evidenced by severe depletion of muscle mass, severe depletion of body fat.  GOAL:  Patient will meet greater than or equal to 90% of their needs, Weight gain  MONITOR:  PO intake, Supplement acceptance, Labs, Weight trends  REASON FOR ASSESSMENT:  Malnutrition Screening Tool   ASSESSMENT: 59 y.o. female with PMH of COPD, GERD, history of DVT, asthma, cerebral hemorrhage 1989, DDD, chronic back pain, history of gastric ulcer, history of stroke, seizure, who presents with seizure and cough.   Pt consumed >75% of breakfast this morning.  She reports good po at home and is confused as to why she keeps losing weight. Explained to pt how illness may cause increased need for calories and protein. Discussed high-calorie, high-protein diet. Teach-back used.  Pt with 6 lb wt loss in the past 8 days and 16 lbs in the past year.  RD to order nutritional supplements per pt preference.  Labs and medications reviewed.  Height:  Ht Readings from Last 1 Encounters:  01/18/15 5\' 7"  (1.702 m)    Weight:  Wt Readings from Last 1 Encounters:  01/18/15 106 lb 14.8 oz (48.5 kg)    Ideal Body Weight:  61.4 kg  Wt Readings from Last 10 Encounters:  01/18/15 106 lb 14.8 oz (48.5 kg)  01/10/15 112 lb (50.803 kg)  11/09/14 110 lb 7.2 oz (50.1 kg)  09/12/14 118 lb (53.524 kg)  05/14/14 122 lb (55.339 kg)  06/17/13 119 lb 4.3 oz (54.1 kg)  06/12/13 134 lb 7.7 oz (61 kg)  06/09/13 120 lb (54.432 kg)  04/15/13 114 lb 10.2 oz (52 kg)  01/23/13 113 lb 5.1 oz (51.4 kg)    BMI:  Body mass index is 16.74 kg/(m^2).  Estimated  Nutritional Needs:  Kcal:  1500-1700  Protein:  70-80 g  Fluid:  >1.7 L/day  Skin:  Reviewed, no issues  Diet Order:  Diet Heart Room service appropriate?: Yes; Fluid consistency:: Thin  EDUCATION NEEDS:  Education needs addressed   Intake/Output Summary (Last 24 hours) at 01/18/15 1250 Last data filed at 01/18/15 1000  Gross per 24 hour  Intake 2334.83 ml  Output    600 ml  Net 1734.83 ml    Last BM:  Prior to admission  Laurette Schimke [place], [place], [place]

## 2015-01-18 NOTE — Consult Note (Signed)
Admission H&P    Chief Complaint: Recurrent seizures.  HPI: [name] is an 59 y.o. female history of seizure disorder, intracerebral hemorrhage, and COPD, brought to the emergency room following recurrent witnessed seizures, along with acute exacerbation of COPD. She was given Ativan in the emergency room and subsequently given a loading dose of fosphenytoin PE. No further seizure activity has been reported. Patient was febrile with a temperature of 101. She is currently on Zosyn and vancomycin. She had been taking Dilantin 100 mg in the morning and 200 mg at night, as well as Vimpat 200 mg twice a day. Dilantin level total was 3.8, corrected to 4.4 based on albumen level from 9 days ago. Patient was adamant that she has been compliant with taking her medications and takes brand name AEDs. She also reports a seizure at home 1 week ago. She's had no recent changes in AEDs.  Past Medical History  Diagnosis Date  . DVT (deep venous thrombosis)     "she's had several since 1990"  . Asthma   . Cerebral hemorrhage 1989  . Chronic back pain   . DDD (degenerative disc disease) 06/12/2013  . Kidney stones   . Chronic bronchitis     "gets it q yr"  . History of blood transfusion     "when she was a baby"  . History of stomach ulcers   . GERD (gastroesophageal reflux disease)   . Headache     "usually around time when she's had a seizure"  . Seizures     "because of her brain surgery"  . Stroke 1990's    family denies residual on 11/09/2014  . Tunnel vision     "since brain OR"  . COPD (chronic obstructive pulmonary disease)     Past Surgical History  Procedure Laterality Date  . Cesarean section  5361; 1987; 1989  . Brain surgery  1989    craniotomy with hematoma evacuation  . Hemorrhoid surgery    . Tubal ligation  1990's    Family History  Problem Relation Age of Onset  . Heart attack Mother   . Heart attack Father    Social History:  reports that she has been smoking  Cigarettes.  She has a 60 pack-year smoking history. She has never used smokeless tobacco. She reports that she does not drink alcohol or use illicit drugs.  Allergies:  Allergies  Allergen Reactions  . Zonisamide Other (See Comments)    Numbness and tingling over whole body  . Codeine Nausea And Vomiting  . Keppra [Levetiracetam] Other (See Comments)    Causes seizures.   [name] Oil Rash    Medications Prior to Admission  Medication Sig Dispense Refill  . aspirin 81 MG chewable tablet Chew 324 mg by mouth once.    . lacosamide (VIMPAT) 200 MG TABS tablet Take 200 mg by mouth 2 (two) times daily.    Marland Kitchen LORazepam (ATIVAN) 0.5 MG tablet Take 1 tablet by mouth as needed. For panic attacks. Max dose of 2 tabs in 24 hours.    . phenytoin (DILANTIN) 100 MG ER capsule Take 100-200 mg by mouth 2 (two) times daily. 1 capsule in the morning and 2 capsules at bedtime.    . predniSONE (DELTASONE) 5 MG tablet Take 1 tablet (5 mg total) by mouth daily with breakfast.    . VENTOLIN HFA 108 (90 BASE) MCG/ACT inhaler Inhale 2 puffs into the lungs 4 (four) times daily as needed.  0  .  Vitamin D, Ergocalciferol, (DRISDOL) 50000 UNITS CAPS capsule Take 50,000 Units by mouth once a week. On Wednesdays      ROS: History obtained from the patient  General ROS: As noted in history of present illness Psychological ROS: negative for - behavioral disorder, hallucinations, memory difficulties, mood swings or suicidal ideation Ophthalmic ROS: negative for - blurry vision, double vision, eye pain or loss of vision ENT ROS: negative for - epistaxis, nasal discharge, oral lesions, sore throat, tinnitus or vertigo Allergy and Immunology ROS: negative for - hives or itchy/watery eyes Hematological and Lymphatic ROS: negative for - bleeding problems, bruising or swollen lymph nodes Endocrine ROS: negative for - galactorrhea, hair pattern changes, polydipsia/polyuria or temperature intolerance Respiratory ROS:  negative for - cough, hemoptysis, shortness of breath or wheezing Cardiovascular ROS: negative for - chest pain, dyspnea on exertion, edema or irregular heartbeat Gastrointestinal ROS: negative for - abdominal pain, diarrhea, hematemesis, nausea/vomiting or stool incontinence Genito-Urinary ROS: negative for - dysuria, hematuria, incontinence or urinary frequency/urgency Musculoskeletal ROS: negative for - joint swelling or muscular weakness Neurological ROS: as noted in HPI Dermatological ROS: negative for rash and skin lesion changes  Physical Examination: Blood pressure 96/69, pulse 88, temperature 98.3 F (36.8 C), temperature source Oral, resp. rate 23, height $RemoveBe'5\' 7"'sfMuoVBwG$  (1.702 m), weight 48.5 kg (106 lb 14.8 oz), SpO2 95 %.  HEENT-  Normocephalic, no lesions, without obvious abnormality.  Normal external eye and conjunctiva.  Normal TM's bilaterally.  Normal auditory canals and external ears. Normal external nose, mucus membranes and septum.  Normal pharynx. Neck supple with no masses, nodes, nodules or enlargement; no signs of meningeal irritation. Cardiovascular - regular rate and rhythm, S1, S2 normal, no murmur, click, rub or gallop Lungs - chest clear, no wheezing, rales, normal symmetric air entry Abdomen - soft, non-tender; bowel sounds normal; no masses,  no organomegaly Extremities - no joint deformities, effusion, or inflammation, no edema and no skin discoloration  Neurologic Examination: Mental Status: Alert, oriented, thought content appropriate.  Speech fluent without evidence of aphasia. Able to follow commands without difficulty. Cranial Nerves: II-left homonymous hemianopsia. III/IV/VI-Pupils were equal and reacted. Extraocular movements were full and conjugate.    V/VII-no facial numbness and no facial weakness. VIII-normal. X-normal speech and symmetrical palatal movement. XI: trapezius strength/neck flexion strength normal bilaterally XII-midline tongue extension  with normal strength. Motor: 5/5 bilaterally with normal tone and bulk Sensory: Normal throughout. Deep Tendon Reflexes: 2+ and symmetric. Plantars: Mute bilaterally Cerebellar: Normal finger-to-nose testing. Carotid auscultation: Normal  Results for orders placed or performed during the hospital encounter of 01/17/15 (from the past 48 hour(s))  CBG monitoring, ED     Status: Abnormal   Collection Time: 01/17/15  9:07 PM  Result Value Ref Range   Glucose-Capillary 143 (H) 65 - 99 mg/dL  CBC with Differential/Platelet     Status: Abnormal   Collection Time: 01/17/15 10:18 PM  Result Value Ref Range   WBC 8.4 4.0 - 10.5 K/uL   RBC 3.63 (L) 3.87 - 5.11 MIL/uL   Hemoglobin 12.1 12.0 - 15.0 g/dL   HCT 37.4 36.0 - 46.0 %   MCV 103.0 (H) 78.0 - 100.0 fL   MCH 33.3 26.0 - 34.0 pg   MCHC 32.4 30.0 - 36.0 g/dL   RDW 12.6 11.5 - 15.5 %   Platelets 200 150 - 400 K/uL   Neutrophils Relative % 91 (H) 43 - 77 %   Neutro Abs 7.6 1.7 - 7.7 K/uL   Lymphocytes  Relative 5 (L) 12 - 46 %   Lymphs Abs 0.5 (L) 0.7 - 4.0 K/uL   Monocytes Relative 4 3 - 12 %   Monocytes Absolute 0.3 0.1 - 1.0 K/uL   Eosinophils Relative 0 0 - 5 %   Eosinophils Absolute 0.0 0.0 - 0.7 K/uL   Basophils Relative 0 0 - 1 %   Basophils Absolute 0.0 0.0 - 0.1 K/uL  Basic metabolic panel     Status: Abnormal   Collection Time: 01/17/15 10:18 PM  Result Value Ref Range   Sodium 136 135 - 145 mmol/L   Potassium 3.9 3.5 - 5.1 mmol/L   Chloride 101 101 - 111 mmol/L   CO2 25 22 - 32 mmol/L   Glucose, Bld 151 (H) 65 - 99 mg/dL   BUN 8 6 - 20 mg/dL   Creatinine, Ser 0.48 0.44 - 1.00 mg/dL   Calcium 8.9 8.9 - 10.3 mg/dL   GFR calc non Af Amer >60 >60 mL/min   GFR calc Af Amer >60 >60 mL/min    Comment: (NOTE) The eGFR has been calculated using the CKD EPI equation. This calculation has not been validated in all clinical situations. eGFR's persistently <60 mL/min signify possible Chronic Kidney Disease.    Anion gap 10 5  - 15  Troponin I     Status: None   Collection Time: 01/17/15 10:18 PM  Result Value Ref Range   Troponin I <0.03 <0.031 ng/mL    Comment:        NO INDICATION OF MYOCARDIAL INJURY.   Urinalysis, Routine w reflex microscopic (not at [facility])     Status: None   Collection Time: 01/17/15 11:50 PM  Result Value Ref Range   Color, Urine YELLOW YELLOW   APPearance CLEAR CLEAR   Specific Gravity, Urine 1.009 1.005 - 1.030   pH 7.0 5.0 - 8.0   Glucose, UA NEGATIVE NEGATIVE mg/dL   Hgb urine dipstick NEGATIVE NEGATIVE   Bilirubin Urine NEGATIVE NEGATIVE   Ketones, ur NEGATIVE NEGATIVE mg/dL   Protein, ur NEGATIVE NEGATIVE mg/dL   Urobilinogen, UA 0.2 0.0 - 1.0 mg/dL   Nitrite NEGATIVE NEGATIVE   Leukocytes, UA NEGATIVE NEGATIVE    Comment: MICROSCOPIC NOT DONE ON URINES WITH NEGATIVE PROTEIN, BLOOD, LEUKOCYTES, NITRITE, OR GLUCOSE <1000 mg/dL.  MRSA PCR Screening     Status: None   Collection Time: 01/18/15 12:56 AM  Result Value Ref Range   MRSA by PCR NEGATIVE NEGATIVE    Comment:        The GeneXpert MRSA Assay (FDA approved for NASAL specimens only), is one component of a comprehensive MRSA colonization surveillance program. It is not intended to diagnose MRSA infection nor to guide or monitor treatment for MRSA infections.   Phenytoin level, total     Status: Abnormal   Collection Time: 01/18/15  3:15 AM  Result Value Ref Range   Phenytoin Lvl 3.8 (L) 10.0 - 20.0 ug/mL  Procalcitonin     Status: None   Collection Time: 01/18/15  3:15 AM  Result Value Ref Range   Procalcitonin <0.10 ng/mL    Comment:        Interpretation: PCT (Procalcitonin) <= 0.5 ng/mL: Systemic infection (sepsis) is not likely. Local bacterial infection is possible. (NOTE)         ICU PCT Algorithm               Non ICU PCT Algorithm    ----------------------------     ------------------------------  PCT < 0.25 ng/mL                 PCT < 0.1 ng/mL     Stopping of antibiotics             Stopping of antibiotics       strongly encouraged.               strongly encouraged.    ----------------------------     ------------------------------       PCT level decrease by               PCT < 0.25 ng/mL       >= 80% from peak PCT       OR PCT 0.25 - 0.5 ng/mL          Stopping of antibiotics                                             encouraged.     Stopping of antibiotics           encouraged.    ----------------------------     ------------------------------       PCT level decrease by              PCT >= 0.25 ng/mL       < 80% from peak PCT        AND PCT >= 0.5 ng/mL            Continuin g antibiotics                                              encouraged.       Continuing antibiotics            encouraged.    ----------------------------     ------------------------------     PCT level increase compared          PCT > 0.5 ng/mL         with peak PCT AND          PCT >= 0.5 ng/mL             Escalation of antibiotics                                          strongly encouraged.      Escalation of antibiotics        strongly encouraged.   APTT     Status: None   Collection Time: 01/18/15  3:15 AM  Result Value Ref Range   aPTT 31 24 - 37 seconds  Protime-INR     Status: Abnormal   Collection Time: 01/18/15  3:15 AM  Result Value Ref Range   Prothrombin Time 15.5 (H) 11.6 - 15.2 seconds   INR 1.21 0.00 - 1.49  Lactic acid, plasma     Status: None   Collection Time: 01/18/15  3:25 AM  Result Value Ref Range   Lactic Acid, Venous 1.3 0.5 - 2.0 mmol/L  Lactic acid, plasma     Status: None   Collection Time: 01/18/15  5:30 AM  Result Value Ref Range  Lactic Acid, Venous 0.6 0.5 - 2.0 mmol/L  Influenza panel by PCR (type A & B, H1N1)(NOT AT [facility])     Status: None   Collection Time: 01/18/15  6:03 AM  Result Value Ref Range   Influenza A By PCR NEGATIVE NEGATIVE   Influenza B By PCR NEGATIVE NEGATIVE   H1N1 flu by pcr NOT DETECTED NOT DETECTED    Comment:         The Xpert Flu assay (FDA approved for nasal aspirates or washes and nasopharyngeal swab specimens), is intended as an aid in the diagnosis of influenza and should not be used as a sole basis for treatment. Performed at [facility]   Strep pneumoniae urinary antigen  (not at [facility])     Status: Abnormal   Collection Time: 01/18/15  6:04 AM  Result Value Ref Range   Strep Pneumo Urinary Antigen POSITIVE (A) NEGATIVE    Comment: Performed at [facility]  Culture, sputum-assessment     Status: None   Collection Time: 01/18/15  8:35 AM  Result Value Ref Range   Specimen Description SPUTUM    Special Requests NONE    Sputum evaluation      THIS SPECIMEN IS ACCEPTABLE. RESPIRATORY CULTURE REPORT TO FOLLOW.   Report Status 01/18/2015 FINAL    Dg Chest 2 View (if Patient Has Fever And/or Copd)  01/17/2015   CLINICAL DATA:  Productive cough, wheezing, and shortness of breath. Seizures today.  EXAM: CHEST  2 VIEW  COMPARISON:  01/10/2015 and 02/27/2012 and chest CT dated 05/28/2007  FINDINGS: On the lateral view there appears to be complete atelectasis of the right middle lobe which is demonstrated be relatively small on the chest CT. Lungs are hyperinflated consistent with emphysema. Heart size is normal. No effusions. No acute osseous abnormality.  IMPRESSION: There appears to be complete atelectasis of the right middle lobe. I suspect the patient has mucous plugging of the right middle lobe bronchus but follow-up is recommended to exclude persistent atelectasis due to an obstructing mass. COPD.   Electronically Signed   By: Lorriane Shire M.D.   On: 01/17/2015 21:18   Dg Chest Port 1 View  01/18/2015   CLINICAL DATA:  59 year old female with cough, shortness of breath and fever  EXAM: PORTABLE CHEST - 1 VIEW  COMPARISON:  Prior chest x-ray 01/17/2015  FINDINGS: The pulmonary vascular congestion with diffusely increased interstitial markings including Kerley B-lines at the basal  periphery. Findings are consistent with mild interstitial pulmonary edema. The cardiac and mediastinal contours remain within normal limits. Trace atherosclerotic calcification present within the transverse aorta. No pleural effusion or pneumothorax. No focal airspace consolidation or infiltrate. Background bronchitic changes similar compared to prior. The lungs remain hyperinflated. No acute osseous abnormality.  IMPRESSION: 1. Increased pulmonary vascular congestion now with mild interstitial pulmonary edema. 2. Background hyperinflation and central bronchitic change remain suggestive of underlying COPD. 3. Mild aortic atherosclerosis.   Electronically Signed   By: Jacqulynn Cadet M.D.   On: 01/18/2015 08:45    Assessment/Plan 59 year old lady with a history of intracerebral hemorrhage and seizure disorder presenting with recurrent seizures with associated subtherapeutic Dilantin level and febrile state.  Recommendations: 1. Continue Dilantin 100 mg every 8 hours, by mouth or IV. 2. Continue Vimpat 200 mg every 12 hours. 3. Dilantin and albumen levels - ordered. 4. Pharmacy consult for Dilantin management.  We will continue to follow this patient with you.  C.R. Nicole Kindred, [place] [facility] [phone]  01/18/2015, 10:50 AM

## 2015-01-18 NOTE — Progress Notes (Signed)
Attempted to call report to 3rd floor, was told will call back when available.

## 2015-01-18 NOTE — Evaluation (Signed)
Physical Therapy Evaluation Patient Details Name: [name] MRN: [id number] DOB: 1955-11-29 Today's Date: 01/18/2015   History of Present Illness  59 yo female admitted with seizure, COPD exac. Recent d/c 6/9. Hx of recurrent seizures, CVA, DVT, asthma.   Clinical Impression  On eval, pt required Min assist for mobility-walked ~250 feet while holding onto dynamap with both hands for support. Pt is unsteady and at risk for falls based on performance today. O2 sats 87% on RA while ambulating. At this time recommend HHPT, 24 hour supervision/assist, and RW (if pt will agree). Pt c/o L LE numbness while walking.     Follow Up Recommendations Home health PT;Supervision/Assistance - 24 hour    Equipment Recommendations  Rolling walker with 5" wheels    Recommendations for Other Services OT consult     Precautions / Restrictions Precautions Precautions: Fall Precaution Comments: monitor sats Restrictions Weight Bearing Restrictions: No      Mobility  Bed Mobility Overal bed mobility: Needs Assistance Bed Mobility: Supine to Sit;Sit to Supine     Supine to sit: Modified independent (Device/Increase time) Sit to supine: Modified independent (Device/Increase time)      Transfers Overall transfer level: Needs assistance   Transfers: Sit to/from Stand Sit to Stand: Min assist         General transfer comment: Assist to rise, stabilize, control descent.   Ambulation/Gait Ambulation/Gait assistance: Min assist Ambulation Distance (Feet): 250 Feet Assistive device:  (2 Hand support on dynamap) Gait Pattern/deviations: Step-through pattern     General Gait Details: O2 sats 87% on RA. Pt unsteady, wavering, uncontrolled. Pt also c/o L LE numbness while walking.   Stairs            Wheelchair Mobility    Modified Rankin (Stroke Patients Only)       Balance Overall balance assessment: Needs assistance;History of Falls         Standing balance support:  No upper extremity supported;During functional activity Standing balance-Leahy Scale: Fair                               Pertinent Vitals/Pain Pain Assessment: No/denies pain    Home Living Family/patient expects to be discharged to:: Private residence Living Arrangements: Parent (mother is undergoing dialysis)   Type of Home: House Home Access: Stairs to enter   [employer] of Steps: a few steps Home Layout: One level Home Equipment: None      Prior Function Level of Independence: Independent               Hand Dominance        Extremity/Trunk Assessment   Upper Extremity Assessment: Defer to OT evaluation           Lower Extremity Assessment: Generalized weakness (pt c/o L LE numbness while ambulating)      Cervical / Trunk Assessment: Normal  Communication   Communication: No difficulties  Cognition Arousal/Alertness: Awake/alert Behavior During Therapy: WFL for tasks assessed/performed Overall Cognitive Status: Within Functional Limits for tasks assessed                      General Comments      Exercises        Assessment/Plan    PT Assessment Patient needs continued PT services  PT Diagnosis Difficulty walking;Abnormality of gait;Generalized weakness   PT Problem List Decreased strength;Decreased activity tolerance;Decreased balance;Decreased mobility  PT Treatment Interventions  DME instruction;Gait training;Functional mobility training;Therapeutic activities;Therapeutic exercise;Patient/family education;Balance training   PT Goals (Current goals can be found in the Care Plan section) Acute Rehab PT Goals Patient Stated Goal: to return to baseline PT Goal Formulation: With patient Time For Goal Achievement: 02/01/15 Potential to Achieve Goals: Good    Frequency Min 3X/week   Barriers to discharge        Co-evaluation               End of Session Equipment Utilized During Treatment: Gait  belt Activity Tolerance: Patient tolerated treatment well Patient left: in bed;with call bell/phone within reach;with bed alarm set           Time: 3888-2800 PT Time Calculation (min) (ACUTE ONLY): 16 min   Charges:   PT Evaluation $Initial PT Evaluation Tier I: 1 Procedure     PT G Codes:        Weston Anna, MPT Pager: [phone]

## 2015-01-18 NOTE — Progress Notes (Signed)
Date:  January 18, 2015 U.R. performed for needs and level of care. Will continue to follow for Case Management needs.  Velva Harman, RN, BSN, [place]   [phone]

## 2015-01-18 NOTE — ED Notes (Signed)
Dr. Doy Mince notified that the pt had redness streaking up her R arm where the vancomycin was infusing. He instructed to slow infusion and give benadryl and monitor for other symptoms.

## 2015-01-18 NOTE — Care Management Note (Signed)
Case Management Note  Patient Details  Name: [name] MRN: [id number] Date of Birth: 1955-11-15  Subjective/Objective:                 Seizures and excerbation of copd   Action/Plan: home   Expected Discharge Date:                  Expected Discharge Plan:  Home/Self Care  In-House Referral:  NA  Discharge planning Services  CM Consult  Post Acute Care Choice:  NA Choice offered to:  NA  DME Arranged:  N/A DME Agency:  NA  HH Arranged:  NA HH Agency:  NA  Status of Service:  In process, will continue to follow  Medicare Important Message Given:    Date Medicare IM Given:    Medicare IM give by:    Date Additional Medicare IM Given:    Additional Medicare Important Message give by:     If discussed at [place] of Stay Meetings, dates discussed:    Additional Comments:  Leeroy Cha, RN 01/18/2015, 9:38 AM

## 2015-01-19 ENCOUNTER — Inpatient Hospital Stay (HOSPITAL_COMMUNITY): Payer: Commercial Managed Care - HMO

## 2015-01-19 DIAGNOSIS — Z681 Body mass index (BMI) 19 or less, adult: Secondary | ICD-10-CM | POA: Diagnosis not present

## 2015-01-19 DIAGNOSIS — E440 Moderate protein-calorie malnutrition: Secondary | ICD-10-CM | POA: Diagnosis not present

## 2015-01-19 DIAGNOSIS — F17210 Nicotine dependence, cigarettes, uncomplicated: Secondary | ICD-10-CM | POA: Diagnosis not present

## 2015-01-19 DIAGNOSIS — J690 Pneumonitis due to inhalation of food and vomit: Secondary | ICD-10-CM | POA: Diagnosis not present

## 2015-01-19 DIAGNOSIS — J9811 Atelectasis: Secondary | ICD-10-CM | POA: Diagnosis not present

## 2015-01-19 DIAGNOSIS — J45909 Unspecified asthma, uncomplicated: Secondary | ICD-10-CM | POA: Diagnosis not present

## 2015-01-19 DIAGNOSIS — A419 Sepsis, unspecified organism: Secondary | ICD-10-CM | POA: Diagnosis not present

## 2015-01-19 DIAGNOSIS — T17890A Other foreign object in other parts of respiratory tract causing asphyxiation, initial encounter: Secondary | ICD-10-CM | POA: Diagnosis not present

## 2015-01-19 DIAGNOSIS — J441 Chronic obstructive pulmonary disease with (acute) exacerbation: Secondary | ICD-10-CM | POA: Diagnosis not present

## 2015-01-19 LAB — LEGIONELLA ANTIGEN, URINE

## 2015-01-19 MED ORDER — PHENYTOIN SODIUM EXTENDED 100 MG PO CAPS: 200.0000 mg | ORAL_CAPSULE | Freq: Two times a day (BID) | ORAL | Status: DC

## 2015-01-19 MED ORDER — AMOXICILLIN-POT CLAVULANATE 500-125 MG PO TABS
500.0000 mg | ORAL_TABLET | Freq: Three times a day (TID) | ORAL | Status: DC
  Filled 2015-01-19 (×3): qty 1

## 2015-01-19 MED ORDER — IBUPROFEN 600 MG PO TABS
600.0000 mg | ORAL_TABLET | Freq: Once | ORAL | Status: AC
  Administered 2015-01-19: 600 mg via ORAL
  Filled 2015-01-19: qty 1
  Filled 2015-01-19: qty 3

## 2015-01-19 MED ORDER — IBUPROFEN 600 MG PO TABS: 600.0000 mg | ORAL_TABLET | Freq: Two times a day (BID) | ORAL | Status: DC | PRN

## 2015-01-19 MED ORDER — AMOXICILLIN-POT CLAVULANATE 500-125 MG PO TABS: 500.0000 mg | ORAL_TABLET | Freq: Three times a day (TID) | ORAL | Status: DC

## 2015-01-19 NOTE — Progress Notes (Signed)
Pt discharged home with sister in stable condition. Discharge instructions and scripts given. Pt verbalized understanding.

## 2015-01-19 NOTE — Evaluation (Signed)
Occupational Therapy Evaluation Patient Details Name: [name] MRN: [id number] DOB: 1956/06/16 Today's Date: 01/19/2015    History of Present Illness 59 yo female admitted with seizure, COPD exac. Recent d/c 6/9. Hx of recurrent seizures, CVA, DVT, asthma.    Clinical Impression   Pt was admitted for the above.  At baseline, she is independent with ADLs.  Pt is overall min guard for ADLs and min A for SPT to 3:1 commode. She will benefit from skilled OT with supervision level goals in acute    Follow Up Recommendations  Home health OT;Supervision/Assistance - 24 hour (pt wants to go home; would benefit from SNF)    Equipment Recommendations  3 in 1 bedside comode (would benefit from this but does not want)    Recommendations for Other Services       Precautions / Restrictions Precautions Precautions: Fall Precaution Comments: monitor sats Restrictions Weight Bearing Restrictions: No      Mobility Bed Mobility         Supine to sit: Modified independent (Device/Increase time) Sit to supine: Modified independent (Device/Increase time)      Transfers     Transfers: Sit to/from Stand Sit to Stand: Min guard         General transfer comment: close guard for safety    Balance                                            ADL Overall ADL's : Needs assistance/impaired             Lower Body Bathing: Min guard;Sit to/from stand       Lower Body Dressing: Min guard;Sit to/from stand   Toilet Transfer: Minimal assistance;Stand-pivot;BSC             General ADL Comments: completed adl from bed level and eob.  Pt with increased back pain today.  Able to bend legs up to reach feet.  Pt unsteady with SPT. She doesn't want RW nor 3:1 commode for home--feel she would benefit from them     Vision     Perception     Praxis      Pertinent Vitals/Pain Pain Assessment: No/denies pain     Hand Dominance     Extremity/Trunk  Assessment Upper Extremity Assessment Upper Extremity Assessment: Generalized weakness           Communication Communication Communication: No difficulties   Cognition Arousal/Alertness: Awake/alert Behavior During Therapy: WFL for tasks assessed/performed Overall Cognitive Status: Within Functional Limits for tasks assessed                     General Comments       Exercises       Shoulder Instructions      Home Living Family/patient expects to be discharged to:: Private residence                   Bathroom Shower/Tub: Walk-in Psychologist, prison and probation services: Standard         Additional Comments: nephew and his family lives with her and her mother      Prior Functioning/Environment Level of Independence: Independent             OT Diagnosis: Generalized weakness;Acute pain   OT Problem List: Decreased strength;Decreased activity tolerance;Impaired balance (sitting and/or standing);Decreased knowledge of precautions;Pain   OT  Treatment/Interventions: Self-care/ADL training;DME and/or AE instruction;Energy conservation;Balance training;Patient/family education    OT Goals(Current goals can be found in the care plan section) Acute Rehab OT Goals Patient Stated Goal: to regain independence OT Goal Formulation: With patient Time For Goal Achievement: 01/26/15 Potential to Achieve Goals: Fair ADL Goals Pt Will Transfer to Toilet: with min guard assist;ambulating;regular height toilet Additional ADL Goal #1: pt will gather clothes at supervision level for adls  OT Frequency: Min 2X/week   Barriers to D/C:            Co-evaluation              End of Session Nurse Communication: Precautions  Activity Tolerance: No increased pain Patient left: in bed;with call bell/phone within reach   Time: 1109-1130 OT Time Calculation (min): 21 min Charges:  OT General Charges $OT Visit: 1 Procedure OT Evaluation $Initial OT Evaluation Tier I: 1  Procedure G-Codes:    Jemal Miskell 02/06/15, 11:42 AM  Lesle Chris, OTR/L [phone] February 06, 2015

## 2015-01-19 NOTE — Progress Notes (Signed)
MEDICATION RELATED CONSULT NOTE - INITIAL   Pharmacy Consult for Phenytoin  Indication: Seizures  Allergies  Allergen Reactions  . Zonisamide Other (See Comments)    Numbness and tingling over whole body  . Codeine Nausea And Vomiting  . Keppra [Levetiracetam] Other (See Comments)    Causes seizures.   [name] Oil Rash    Patient Measurements: Height: 5\' 7"  (170.2 cm) Weight: 106 lb 14.8 oz (48.5 kg) IBW/kg (Calculated) : 61.6  Vital Signs: Temp: 99 F (37.2 C) (06/17 0515) Temp Source: Oral (06/17 0515) BP: 94/53 mmHg (06/17 0515) Pulse Rate: 72 (06/17 0515) Intake/Output from previous day: 06/16 0701 - 06/17 0700 In: 1185.8 [P.O.:240; I.V.:845.8; IV Piggyback:100] Out: 800 [Urine:800] Intake/Output from this shift: Total I/O In: -  Out: 800 [Urine:800]  Labs:  Recent Labs  01/17/15 2218 01/18/15 0315 01/18/15 1144  WBC 8.4  --   --   HGB 12.1  --   --   HCT 37.4  --   --   PLT 200  --   --   APTT  --  31  --   CREATININE 0.48  --   --   ALBUMIN  --   --  3.0*   Estimated Creatinine Clearance: 60.1 mL/min (by C-G formula based on Cr of 0.48).   Microbiology: Recent Results (from the past 720 hour(s))  MRSA PCR Screening     Status: None   Collection Time: 01/18/15 12:56 AM  Result Value Ref Range Status   MRSA by PCR NEGATIVE NEGATIVE Final    Comment:        The GeneXpert MRSA Assay (FDA approved for NASAL specimens only), is one component of a comprehensive MRSA colonization surveillance program. It is not intended to diagnose MRSA infection nor to guide or monitor treatment for MRSA infections.   Culture, sputum-assessment     Status: None   Collection Time: 01/18/15  8:35 AM  Result Value Ref Range Status   Specimen Description SPUTUM  Final   Special Requests NONE  Final   Sputum evaluation   Final    THIS SPECIMEN IS ACCEPTABLE. RESPIRATORY CULTURE REPORT TO FOLLOW.   Report Status 01/18/2015 FINAL  Final  Culture, respiratory  (NON-Expectorated)     Status: None (Preliminary result)   Collection Time: 01/18/15  8:35 AM  Result Value Ref Range Status   Specimen Description SPUTUM  Final   Special Requests NONE  Final   Gram Stain   Final    RARE WBC PRESENT,BOTH PMN AND MONONUCLEAR RARE SQUAMOUS EPITHELIAL CELLS PRESENT RARE GRAM POSITIVE COCCI IN PAIRS AND CHAINS RARE GRAM POSITIVE RODS Performed at [facility]    Culture PENDING  Incomplete   Report Status PENDING  Incomplete    Medical History: Past Medical History  Diagnosis Date  . DVT (deep venous thrombosis)     "she's had several since 1990"  . Asthma   . Cerebral hemorrhage 1989  . Chronic back pain   . DDD (degenerative disc disease) 06/12/2013  . Kidney stones   . Chronic bronchitis     "gets it q yr"  . History of blood transfusion     "when she was a baby"  . History of stomach ulcers   . GERD (gastroesophageal reflux disease)   . Headache     "usually around time when she's had a seizure"  . Seizures     "because of her brain surgery"  . Stroke 1990's  family denies residual on 11/09/2014  . Tunnel vision     "since brain OR"  . COPD (chronic obstructive pulmonary disease)     Medications:  Scheduled:  . dextromethorphan-guaiFENesin  1 tablet Oral BID  . feeding supplement (ENSURE ENLIVE)  237 mL Oral TID BM  . heparin  5,000 Units Subcutaneous 3 times per day  . ipratropium-albuterol  3 mL Nebulization TID  . lacosamide  200 mg Oral BID  . nicotine  21 mg Transdermal Daily  . phenytoin  100 mg Oral Q8H  . piperacillin-tazobactam (ZOSYN)  IV  3.375 g Intravenous 3 times per day  . pneumococcal 23 valent vaccine  0.5 mL Intramuscular Tomorrow-1000  . predniSONE  2.5 mg Oral Q breakfast  . vancomycin  750 mg Intravenous Q12H    Assessment:  59 yr female admitted on 6/15 for recurrent seizures.  H/O COPD, seizures and intracerebral hemorrhage.  Home regimen is Vimpat 200mg  BID and Dilantin 100mg  QAM & 200mg  QPM.    Total  Phenytoin level on 6/16 @ 03:15 = 3.8 with albumin (6/7) = 3.8.  Phenytoin corrected to 4.42  Received Fosphenytoin 970mg  PE (20 mg PE/kg) x 1  Neurology consulted and adjusted regimen to phenytoin 100mg  IV q8h (changed to PO 6/16 evening)  Total Phenytoin level this AM = 20.4 with albumin of 3, which corrects to 29.14 (Goal 10-20 mcg/ml)  Pharmacy consulted to manage phenytoin dose  No seizure activity noted since admission   Goal of Therapy:  Phenytoin 10-20 mcg/ml  Plan:   Continue current regimen of Dilantin 100mg  po q8h  Monitor daily for signs of toxicity (nystagmus, ataxia, confusion, somnolence)  Check phenytoin level again in 7 days (once medication is at new steady state) and evaluate regimen.  Check level earlier if patient displaying signs/symptoms of toxicity or if seizure activity reoccurs  Zayvion Stailey, Toribio Harbour, PharmD 01/19/2015,6:54 AM

## 2015-01-19 NOTE — Clinical Documentation Improvement (Signed)
Query #1 "Sepsis" was documented in the H&P and is documented on the current hospital problem list dated 01/17/15 by Dr. Blaine Hamper but has not been documented since.  Please document if Sepsis was:  - Ruled out   - Confirmed and ruled in  - Unable to clinically determine  Query #2 "Hypoxic Respiratory Failure" is documented in the ED physician noted on 01/18/15 at 1302.  Patient has evidence of right middle lobe collapse on CXR.  Currently being treated for Aspiration Pneumonia.  Patient has required 02 at 4 liters.  Respiratory rate was in the 20's to 30's  Please document if you agree with diagnosis of hypoxic respiratory failure, including type (acute, chronic, acute on chronic) in the progress notes, discharge summary and current hospital problem list.   Thank You, Erling Conte ,RN Clinical Documentation Specialist [phone] [facility]

## 2015-01-19 NOTE — Care Management Note (Signed)
Case Management Note  Patient Details  Name: [name] MRN: [id number] Date of Birth: June 14, 1956  Subjective/Objective:     59 yo admitted with Cough               Action/Plan: From home with mother  Expected Discharge Date:                  Expected Discharge Plan:  [place]  In-House Referral:     Discharge planning Services  CM Consult  Post Acute Care Choice:  Home Health Choice offered to:  Patient  DME Arranged:  N/A DME Agency:  [place]:  PT, RN, Nurse's Aide,SW [facility] Agency:  [place]  Status of Service:  In process, will continue to follow  Medicare Important Message Given:    Date Medicare IM Given:    Medicare IM give by:    Date Additional Medicare IM Given:    Additional Medicare Important Message give by:     If discussed at [place] of Stay Meetings, dates discussed:    Additional Comments: Orders for HHPT/RN/Aide/SW. Met with pt at bedside to offer choice for [facility] services. Pt states she has used [facility] in the past and would like to use them again. [facility] rep contacted to give referral. PT recommended RW for home. Pt declines RW at this time. No other DC needs identified. Lynnell Catalan, RN 01/19/2015, 10:04 AM

## 2015-01-19 NOTE — Discharge Summary (Addendum)
[name], is a 59 y.o. female  DOB 08/23/1955  MRN [id number].  Admission date:  01/17/2015  Admitting Physician  Ivor Costa, MD  Discharge Date:  01/19/2015   Primary MD  [name], MD  Recommendations for primary care physician for things to follow:   Monitor Dilantin levels closely, needs outpatient neurology follow-up for recurrent seizures   Admission Diagnosis  Chronic obstructive pulmonary disease with acute exacerbation [J44.1]   Discharge Diagnosis  Chronic obstructive pulmonary disease with acute exacerbation [J44.1]    Principal Problem:   Cough Active Problems:   Tobacco dependence   Recurrent seizures   Asthma   Seizure   Protein calorie malnutrition   COPD (chronic obstructive pulmonary disease)   Fever   Sepsis   Hypoxia   Chronic obstructive pulmonary disease with acute exacerbation      Past Medical History  Diagnosis Date  . DVT (deep venous thrombosis)     "she's had several since 1990"  . Asthma   . Cerebral hemorrhage 1989  . Chronic back pain   . DDD (degenerative disc disease) 06/12/2013  . Kidney stones   . Chronic bronchitis     "gets it q yr"  . History of blood transfusion     "when she was a baby"  . History of stomach ulcers   . GERD (gastroesophageal reflux disease)   . Headache     "usually around time when she's had a seizure"  . Seizures     "because of her brain surgery"  . Stroke 1990's    family denies residual on 11/09/2014  . Tunnel vision     "since brain OR"  . COPD (chronic obstructive pulmonary disease)     Past Surgical History  Procedure Laterality Date  . Cesarean section  4650; 1987; 1989  . Brain surgery  1989    craniotomy with hematoma evacuation  . Hemorrhoid surgery    . Tubal ligation  1990's       HPI  from the history  and physical done on the day of admission:   [name] is a 59 y.o. female with PMH of COPD, GERD, history of DVT, asthma, cerebral hemorrhage 1989, DDD, chronic back pain, history of gastric ulcer, history of stroke, seizure, who presents with seizure and cough.   Patient is postictal and is a poor historian and unable to provide much history. Therefore, most of the history is obtained by discussing the case with the ED physician also per EMS report. It seems that patient's mother call EMS and stating that the patient had a seizure tonight, stating that her foot hit the ground and shook. Mother states that this was typical for her.This happened again in the EMS truck. In the emergency room, patient was found to have cough, no sputum production. She has fever, but no chest pain. He reports having mild shortness of breath.  In ED, patient was found to have WBC 8.4, troponin negative, temperature 101, tachycardia, electrolytes okay. Chest x-ray showed  right middle lobe complete atelectasis. Patient is admitted to inpatient for further evaluation and treatment.     Hospital Course:    1. Recurrent seizure. Patient on Dilantin and Vimpat. Claims to be compliant, Dilantin levels persistently subtherapeutic, loaded IV Cerebyx, Dilantin level is now therapeutic. Seen by neurology. Have increased home Dilantin dose from 100 in the morning-200 in the evening to 200 twice a day, she refused to take it 3 times a day. Continue Vimpat. She is now symptom-free will follow with you. In 3-4 days will request PCP to check and monitor Dilantin levels closely, have also requested her to follow with outpatient neurology.   2. Aspiration HCAP with right-sided mucous plugging and lung collapse. Mucous plugging and lung collapse improved after chest PT and suctioning, a urinary strep pneumonia antigen is positive, will be placed on oral Augmentin to complete a total of seven-day course, she is currently symptom-free  without any oxygen need, refused home oxygen evaluation saying she is noncongested wear it at any cost. However clinically back to baseline. Request PCP to check CBC a chest x-ray next visit. No sepsis in my evaluation.   3. Tobacco abuse and alcohol abuse. Claims she has quit alcohol several years ago, counseled to quit smoking.     4. Moderate protein calorie malnutrition. Provided ensure in the hospital. Counseled on diet.    5. COPD. At baseline. Tapered off prednisone which was recently started on last admission, no wheezing.    6. Musculoskeletal low back pain started this morning after she woke up, no point tenderness, x-ray T and L-spine unremarkable. This is likely due to sleeping in hospital bed. Motrin for pain control for the next 4-5 days.     Discharge Condition: Stable  Follow UP  Follow-up Information    Follow up with [name], MD. Schedule an appointment as soon as possible for a visit in 4 days.   Specialty:  Internal Medicine   Why:  check dilantin levels   Contact information:   [address] [place] [address] [phone]       Follow up with [facility]. Schedule an appointment as soon as possible for a visit in 1 week.   Why:  seizures   Contact information:   [address] [place] [address] [phone]       Consults obtained - Neuro  Diet and Activity recommendation: See Discharge Instructions below  Discharge Instructions           Discharge Instructions    Diet - low sodium heart healthy    Complete by:  As directed      Discharge instructions    Complete by:  As directed   Do not drive, operating heavy machinery, perform activities at heights, swimming or participation in water activities or provide baby sitting services until you have seen by Primary MD & your Neurologist and advised to do so again.   Follow with Primary MD [name], MD in 3 days   Get CBC,  CMP, 2 view Chest X ray checked  by Primary MD next visit.    Activity: As tolerated with Full fall precautions use walker/cane & assistance as needed   Disposition Home     Diet: Heart Healthy    For Heart failure patients - Check your Weight same time everyday, if you gain over 2 pounds, or you develop in leg swelling, experience more shortness of breath or chest pain, call your Primary  MD immediately. Follow Cardiac Low Salt Diet and 1.5 lit/day fluid restriction.   On your next visit with your primary care physician please Get Medicines reviewed and adjusted.   Please request your Prim.MD to go over all Hospital Tests and Procedure/Radiological results at the follow up, please get all Hospital records sent to your Prim MD by signing hospital release before you go home.   If you experience worsening of your admission symptoms, develop shortness of breath, life threatening emergency, suicidal or homicidal thoughts you must seek medical attention immediately by calling 911 or calling your MD immediately  if symptoms less severe.  You Must read complete instructions/literature along with all the possible adverse reactions/side effects for all the Medicines you take and that have been prescribed to you. Take any new Medicines after you have completely understood and accpet all the possible adverse reactions/side effects.   Do not drive when taking Pain medications.    Do not take more than prescribed Pain, Sleep and Anxiety Medications  Special Instructions: If you have smoked or chewed Tobacco  in the last 2 yrs please stop smoking, stop any regular Alcohol  and or any Recreational drug use.  Wear Seat belts while driving.   Please note  You were cared for by a hospitalist during your hospital stay. If you have any questions about your discharge medications or the care you received while you were in the hospital after you are discharged, you can call the unit and asked to speak  with the hospitalist on call if the hospitalist that took care of you is not available. Once you are discharged, your primary care physician will handle any further medical issues. Please note that NO REFILLS for any discharge medications will be authorized once you are discharged, as it is imperative that you return to your primary care physician (or establish a relationship with a primary care physician if you do not have one) for your aftercare needs so that they can reassess your need for medications and monitor your lab values.     Increase activity slowly    Complete by:  As directed              Discharge Medications       Medication List    STOP taking these medications        predniSONE 5 MG tablet  Commonly known as:  DELTASONE      TAKE these medications        amoxicillin-clavulanate 500-125 MG per tablet  Commonly known as:  AUGMENTIN  Take 1 tablet (500 mg total) by mouth every 8 (eight) hours.     aspirin 81 MG chewable tablet  Chew 324 mg by mouth once.     ibuprofen 600 MG tablet  Commonly known as:  ADVIL,MOTRIN  Take 1 tablet (600 mg total) by mouth 2 (two) times daily as needed (back pain).     lacosamide 200 MG Tabs tablet  Commonly known as:  VIMPAT  Take 200 mg by mouth 2 (two) times daily.     LORazepam 0.5 MG tablet  Commonly known as:  ATIVAN  Take 1 tablet by mouth as needed. For panic attacks. Max dose of 2 tabs in 24 hours.     phenytoin 100 MG ER capsule  Commonly known as:  DILANTIN  Take 2 capsules (200 mg total) by mouth 2 (two) times daily. Must get a Dilantin level checked by her primary care physician within a week  VENTOLIN HFA 108 (90 BASE) MCG/ACT inhaler  Generic drug:  albuterol  Inhale 2 puffs into the lungs 4 (four) times daily as needed.     Vitamin D (Ergocalciferol) 50000 UNITS Caps capsule  Commonly known as:  DRISDOL  Take 50,000 Units by mouth once a week. On Wednesdays        Major procedures and Radiology  Reports - PLEASE review detailed and final reports for all details, in brief -      Dg Chest 2 View (if Patient Has Fever And/or Copd)  01/17/2015   CLINICAL DATA:  Productive cough, wheezing, and shortness of breath. Seizures today.  EXAM: CHEST  2 VIEW  COMPARISON:  01/10/2015 and 02/27/2012 and chest CT dated 05/28/2007  FINDINGS: On the lateral view there appears to be complete atelectasis of the right middle lobe which is demonstrated be relatively small on the chest CT. Lungs are hyperinflated consistent with emphysema. Heart size is normal. No effusions. No acute osseous abnormality.  IMPRESSION: There appears to be complete atelectasis of the right middle lobe. I suspect the patient has mucous plugging of the right middle lobe bronchus but follow-up is recommended to exclude persistent atelectasis due to an obstructing mass. COPD.   Electronically Signed   By: Lorriane Shire M.D.   On: 01/17/2015 21:18   Dg Thoracic Spine 2 View  01/19/2015   CLINICAL DATA:  Right-sided back pain, no known injury, initial encounter  EXAM: THORACIC SPINE - 2 VIEW  COMPARISON:  01/18/2015  FINDINGS: The pedicles are within normal limits. Mild degenerative changes of the thoracic spine are seen. Vertebral body height is well maintained. No paraspinal mass lesion is noted.  IMPRESSION: Mild degenerative change without acute abnormality. Diffuse interstitial changes are noted within both lungs stable from the prior exams   Electronically Signed   By: Inez Catalina M.D.   On: 01/19/2015 09:13   Dg Lumbar Spine Complete  01/19/2015   CLINICAL DATA:  RIGHT sided back pain.  No injury.  EXAM: LUMBAR SPINE - COMPLETE 4+ VIEW  COMPARISON:  06/05/2013 MRI.  FINDINGS: Lower lumbar facet arthropathy. Exaggerated lordosis. Trace anterolisthesis L5 on L4 and S1. This appears to be related to facet disease as there are no pars defects. Slight lateral spurring on the RIGHT @ L2-L3. Mild vascular calcification.  IMPRESSION: No acute  findings.   Electronically Signed   By: Staci Righter M.D.   On: 01/19/2015 09:17   Dg Chest Port 1 View  01/18/2015   CLINICAL DATA:  59 year old female with cough, shortness of breath and fever  EXAM: PORTABLE CHEST - 1 VIEW  COMPARISON:  Prior chest x-ray 01/17/2015  FINDINGS: The pulmonary vascular congestion with diffusely increased interstitial markings including Kerley B-lines at the basal periphery. Findings are consistent with mild interstitial pulmonary edema. The cardiac and mediastinal contours remain within normal limits. Trace atherosclerotic calcification present within the transverse aorta. No pleural effusion or pneumothorax. No focal airspace consolidation or infiltrate. Background bronchitic changes similar compared to prior. The lungs remain hyperinflated. No acute osseous abnormality.  IMPRESSION: 1. Increased pulmonary vascular congestion now with mild interstitial pulmonary edema. 2. Background hyperinflation and central bronchitic change remain suggestive of underlying COPD. 3. Mild aortic atherosclerosis.   Electronically Signed   By: Jacqulynn Cadet M.D.   On: 01/18/2015 08:45   Dg Chest Port 1 View  01/10/2015   CLINICAL DATA:  Fall.  Seizure.  Right shoulder pain.  EXAM: PORTABLE CHEST - 1 VIEW  COMPARISON:  01/10/2015  FINDINGS: The heart size and mediastinal contours are within normal limits. Both lungs are clear. The visualized skeletal structures are unremarkable.  IMPRESSION: No active disease.   Electronically Signed   By: Kerby Moors M.D.   On: 01/10/2015 15:58   Dg Shoulder Right Port  01/10/2015   CLINICAL DATA:  Fall.  Seizure.  Right shoulder pain.  EXAM: PORTABLE RIGHT SHOULDER - 2+ VIEW  COMPARISON:  None.  FINDINGS: There is no evidence of fracture or dislocation. There is no evidence of arthropathy or other focal bone abnormality. Soft tissues are unremarkable.  IMPRESSION: Negative.   Electronically Signed   By: Kerby Moors M.D.   On: 01/10/2015 15:55     Micro Results      Recent Results (from the past 240 hour(s))  MRSA PCR Screening     Status: None   Collection Time: 01/18/15 12:56 AM  Result Value Ref Range Status   MRSA by PCR NEGATIVE NEGATIVE Final    Comment:        The GeneXpert MRSA Assay (FDA approved for NASAL specimens only), is one component of a comprehensive MRSA colonization surveillance program. It is not intended to diagnose MRSA infection nor to guide or monitor treatment for MRSA infections.   Culture, sputum-assessment     Status: None   Collection Time: 01/18/15  8:35 AM  Result Value Ref Range Status   Specimen Description SPUTUM  Final   Special Requests NONE  Final   Sputum evaluation   Final    THIS SPECIMEN IS ACCEPTABLE. RESPIRATORY CULTURE REPORT TO FOLLOW.   Report Status 01/18/2015 FINAL  Final  Culture, respiratory (NON-Expectorated)     Status: None (Preliminary result)   Collection Time: 01/18/15  8:35 AM  Result Value Ref Range Status   Specimen Description SPUTUM  Final   Special Requests NONE  Final   Gram Stain   Final    RARE WBC PRESENT,BOTH PMN AND MONONUCLEAR RARE SQUAMOUS EPITHELIAL CELLS PRESENT RARE GRAM POSITIVE COCCI IN PAIRS AND CHAINS RARE GRAM POSITIVE RODS Performed at [facility]    Culture   Final    Culture reincubated for better growth Performed at [facility]    Report Status PENDING  Incomplete       Today   Subjective    [name] today has no headache,no chest abdominal pain,no new weakness tingling or numbness, feels much better wants to go home today.     Objective   Blood pressure 94/53, pulse 72, temperature 99 F (37.2 C), temperature source Oral, resp. rate 22, height 5\' 7"  (1.702 m), weight 48.5 kg (106 lb 14.8 oz), SpO2 96 %.   Intake/Output Summary (Last 24 hours) at 01/19/15 0930 Last data filed at 01/19/15 0600  Gross per 24 hour  Intake 1185.83 ml  Output    800 ml  Net 385.83 ml    Exam Awake  Alert, Oriented x 3, No new F.N deficits, Normal affect [place].AT,PERRAL Supple Neck,No JVD, No cervical lymphadenopathy appriciated.  Symmetrical Chest wall movement, Good air movement bilaterally, CTAB RRR,No Gallops,Rubs or new Murmurs, No Parasternal Heave +ve B.Sounds, Abd Soft, Non tender, No organomegaly appriciated, No rebound -guarding or rigidity. No Cyanosis, Clubbing or edema, No new Rash or bruise   Data Review   CBC w Diff:  Lab Results  Component Value Date   WBC 8.4 01/17/2015   WBC 4.2 06/10/2013   HGB 12.1 01/17/2015   HCT 37.4  01/17/2015   PLT 200 01/17/2015   LYMPHOPCT 5* 01/17/2015   MONOPCT 4 01/17/2015   EOSPCT 0 01/17/2015   BASOPCT 0 01/17/2015    CMP:  Lab Results  Component Value Date   NA 136 01/17/2015   NA 145* 06/10/2013   K 3.9 01/17/2015   CL 101 01/17/2015   CO2 25 01/17/2015   BUN 8 01/17/2015   BUN 13 06/10/2013   CREATININE 0.48 01/17/2015   PROT 6.5 01/10/2015   PROT 6.1 06/10/2013   ALBUMIN 3.0* 01/18/2015   BILITOT 0.4 01/10/2015   ALKPHOS 75 01/10/2015   AST 16 01/10/2015   ALT 12* 01/10/2015  .   Total Time in preparing paper work, data evaluation and todays exam - 35 minutes  Thurnell Lose M.D on 01/19/2015 at 9:30 AM  [facility]   Office  [phone]

## 2015-01-19 NOTE — Discharge Instructions (Signed)
Do not drive, operating heavy machinery, perform activities at heights, swimming or participation in water activities or provide baby sitting services until you have seen by Primary MD & your Neurologist and advised to do so again.   Follow with Primary MD [name], MD in 3 days   Get CBC, CMP, 2 view Chest X ray checked  by Primary MD next visit.    Activity: As tolerated with Full fall precautions use walker/cane & assistance as needed   Disposition Home     Diet: Heart Healthy    For Heart failure patients - Check your Weight same time everyday, if you gain over 2 pounds, or you develop in leg swelling, experience more shortness of breath or chest pain, call your Primary MD immediately. Follow Cardiac Low Salt Diet and 1.5 lit/day fluid restriction.   On your next visit with your primary care physician please Get Medicines reviewed and adjusted.   Please request your Prim.MD to go over all Hospital Tests and Procedure/Radiological results at the follow up, please get all Hospital records sent to your Prim MD by signing hospital release before you go home.   If you experience worsening of your admission symptoms, develop shortness of breath, life threatening emergency, suicidal or homicidal thoughts you must seek medical attention immediately by calling 911 or calling your MD immediately  if symptoms less severe.  You Must read complete instructions/literature along with all the possible adverse reactions/side effects for all the Medicines you take and that have been prescribed to you. Take any new Medicines after you have completely understood and accpet all the possible adverse reactions/side effects.   Do not drive when taking Pain medications.    Do not take more than prescribed Pain, Sleep and Anxiety Medications  Special Instructions: If you have smoked or chewed Tobacco  in the last 2 yrs please stop smoking, stop any regular Alcohol  and or any Recreational drug  use.  Wear Seat belts while driving.   Please note  You were cared for by a hospitalist during your hospital stay. If you have any questions about your discharge medications or the care you received while you were in the hospital after you are discharged, you can call the unit and asked to speak with the hospitalist on call if the hospitalist that took care of you is not available. Once you are discharged, your primary care physician will handle any further medical issues. Please note that NO REFILLS for any discharge medications will be authorized once you are discharged, as it is imperative that you return to your primary care physician (or establish a relationship with a primary care physician if you do not have one) for your aftercare needs so that they can reassess your need for medications and monitor your lab values.

## 2015-01-20 LAB — CULTURE, RESPIRATORY W GRAM STAIN: Culture: NORMAL

## 2015-01-20 LAB — CULTURE, RESPIRATORY

## 2015-01-21 DIAGNOSIS — Z720 Tobacco use: Secondary | ICD-10-CM | POA: Diagnosis not present

## 2015-01-21 DIAGNOSIS — R569 Unspecified convulsions: Secondary | ICD-10-CM | POA: Diagnosis not present

## 2015-01-21 DIAGNOSIS — E46 Unspecified protein-calorie malnutrition: Secondary | ICD-10-CM | POA: Diagnosis not present

## 2015-01-21 DIAGNOSIS — K219 Gastro-esophageal reflux disease without esophagitis: Secondary | ICD-10-CM | POA: Diagnosis not present

## 2015-01-21 DIAGNOSIS — J45909 Unspecified asthma, uncomplicated: Secondary | ICD-10-CM | POA: Diagnosis not present

## 2015-01-21 DIAGNOSIS — J441 Chronic obstructive pulmonary disease with (acute) exacerbation: Secondary | ICD-10-CM | POA: Diagnosis not present

## 2015-01-21 DIAGNOSIS — Z8673 Personal history of transient ischemic attack (TIA), and cerebral infarction without residual deficits: Secondary | ICD-10-CM | POA: Diagnosis not present

## 2015-01-22 DIAGNOSIS — J45909 Unspecified asthma, uncomplicated: Secondary | ICD-10-CM | POA: Diagnosis not present

## 2015-01-22 DIAGNOSIS — E46 Unspecified protein-calorie malnutrition: Secondary | ICD-10-CM | POA: Diagnosis not present

## 2015-01-22 DIAGNOSIS — J441 Chronic obstructive pulmonary disease with (acute) exacerbation: Secondary | ICD-10-CM | POA: Diagnosis not present

## 2015-01-22 DIAGNOSIS — Z8673 Personal history of transient ischemic attack (TIA), and cerebral infarction without residual deficits: Secondary | ICD-10-CM | POA: Diagnosis not present

## 2015-01-22 DIAGNOSIS — R569 Unspecified convulsions: Secondary | ICD-10-CM | POA: Diagnosis not present

## 2015-01-22 DIAGNOSIS — K219 Gastro-esophageal reflux disease without esophagitis: Secondary | ICD-10-CM | POA: Diagnosis not present

## 2015-01-22 DIAGNOSIS — Z720 Tobacco use: Secondary | ICD-10-CM | POA: Diagnosis not present

## 2015-01-23 LAB — CULTURE, BLOOD (ROUTINE X 2)
CULTURE: NO GROWTH
CULTURE: NO GROWTH

## 2015-01-23 IMAGING — CT CT HEAD W/O CM
2 series · 16 of 30 positions shown, 19 images · non-contrast
Comparison: 09/09/2012

CLINICAL DATA: Dizziness, seizure disorder, remote intracranial
hemorrhage.

CT HEAD WITHOUT CONTRAST
TECHNIQUE: Contiguous axial images were obtained from the base of
the skull through the vertex without contrast.

[Series 2: head w/o · axial · non-contrast · 0.43mm/px · z∈[-142,-22]mm · 9 of 30 slices shown, 12 images]
[im 3/30  brain]
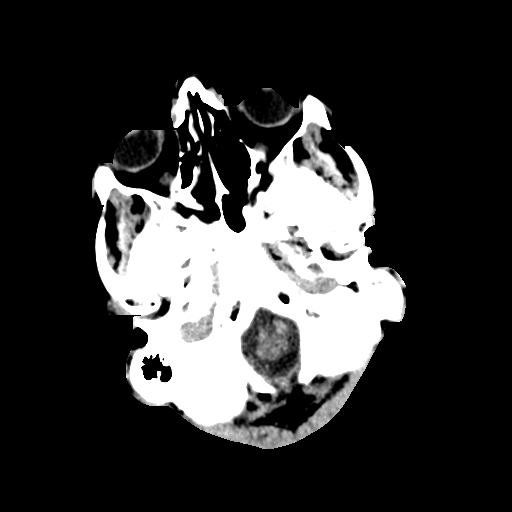
[im 3/30  bone]
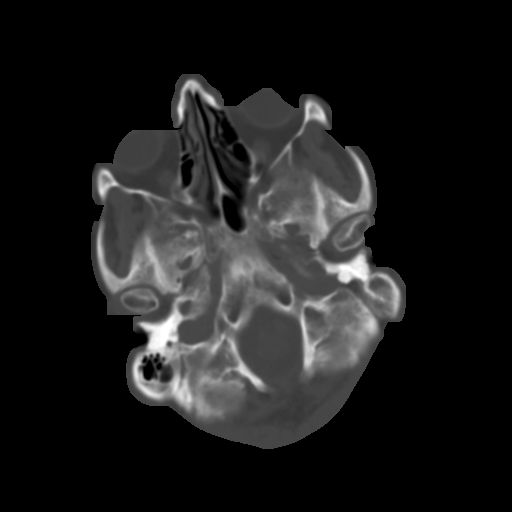
[im 6/30  brain]
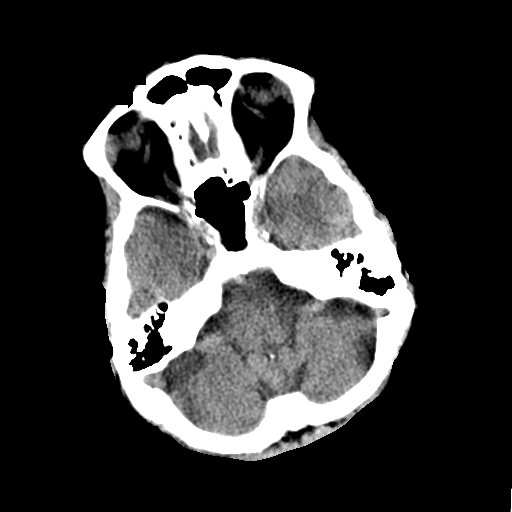
[im 9/30  brain]
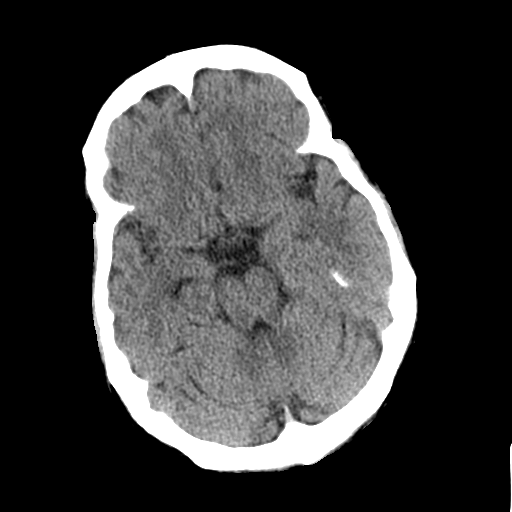
[im 12/30  brain]
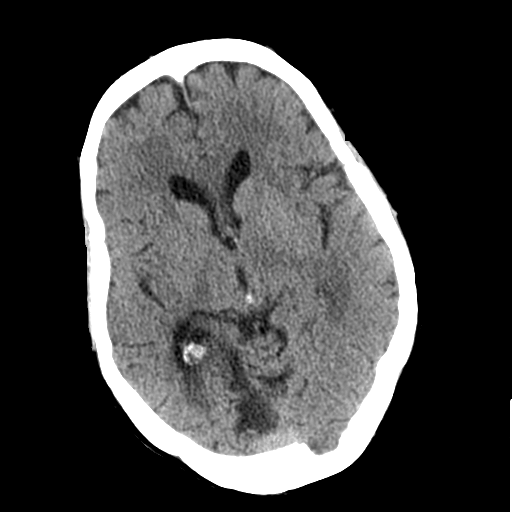
[im 15/30  brain]
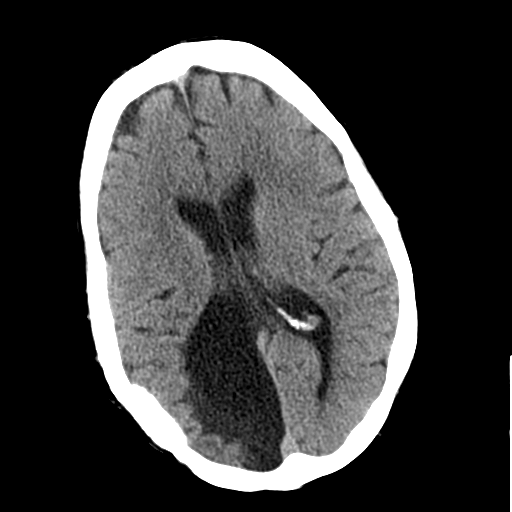
[im 15/30  bone]
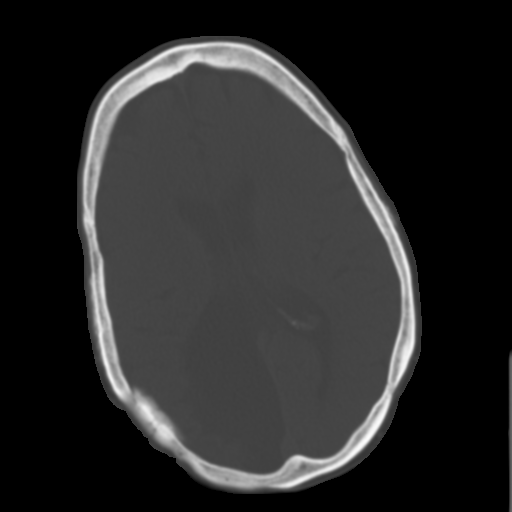
[im 18/30  brain]
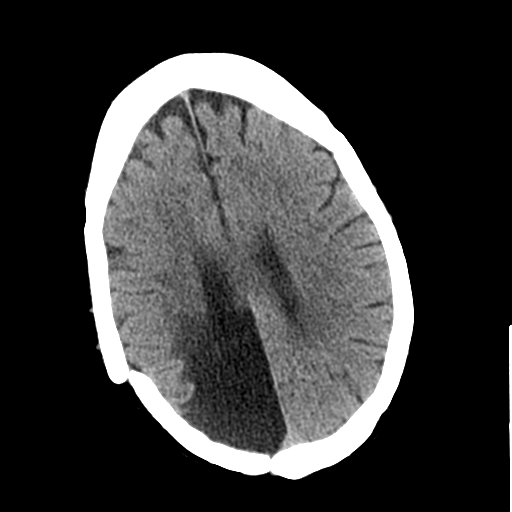
[im 21/30  brain]
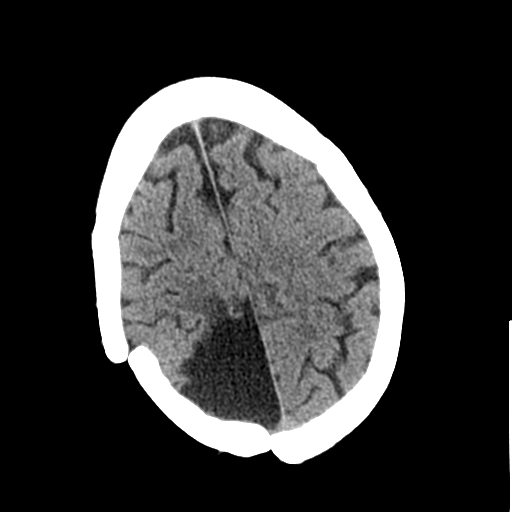
[im 24/30  brain]
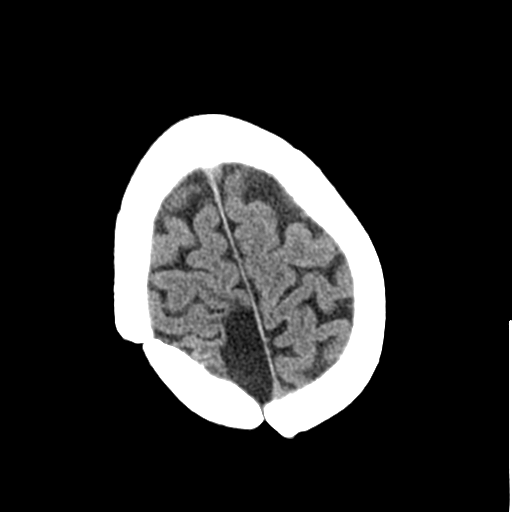
[im 27/30  brain]
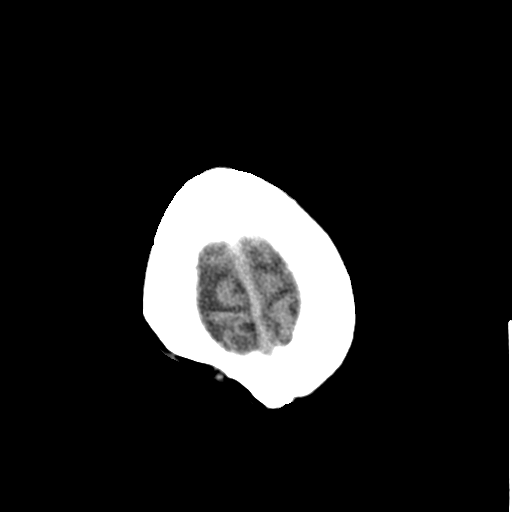
[im 27/30  bone]
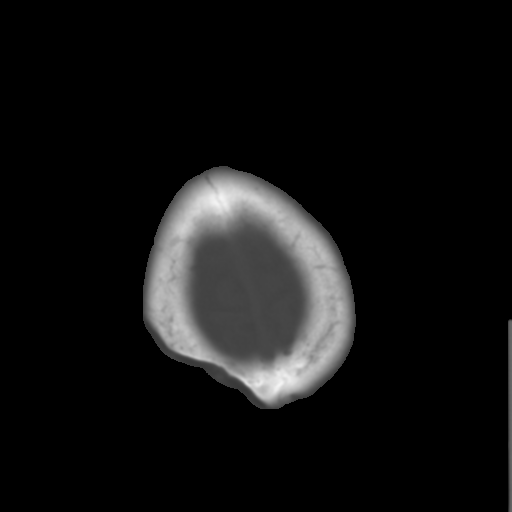

[Series 3: bone windows · axial · 0.43mm/px · z∈[-137,-41]mm · 7 of 49 slices shown]
[im 6/49  bone]
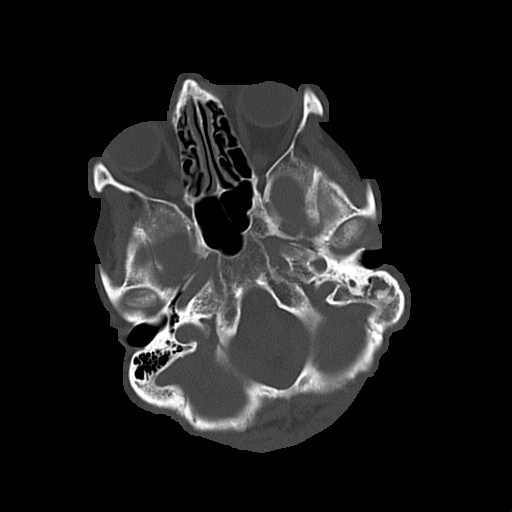
[im 11/49  bone]
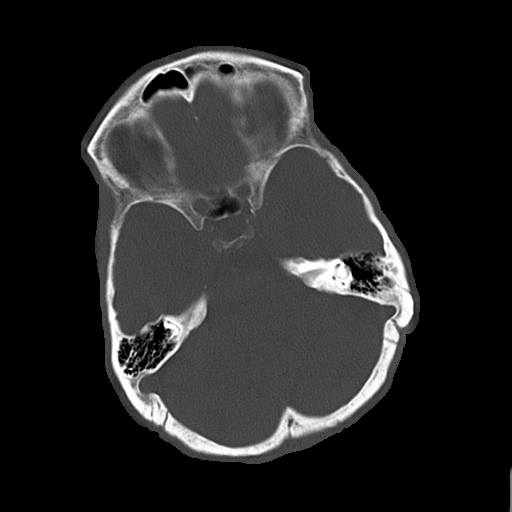
[im 17/49  bone]
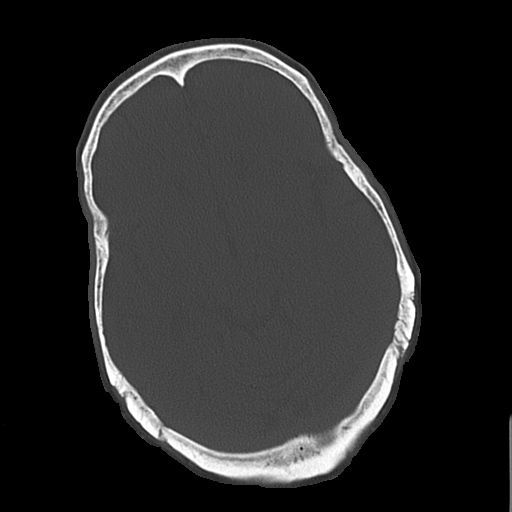
[im 22/49  bone]
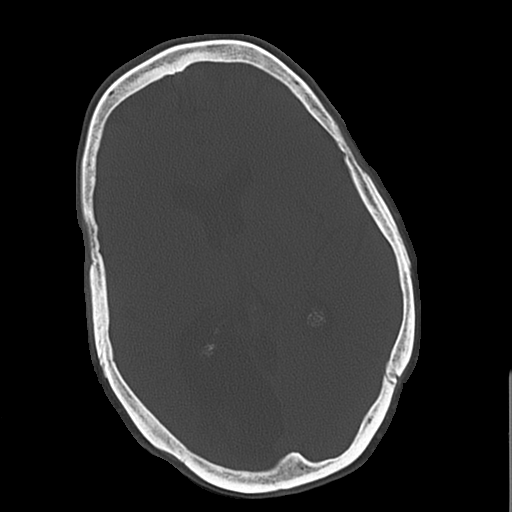
[im 27/49  bone]
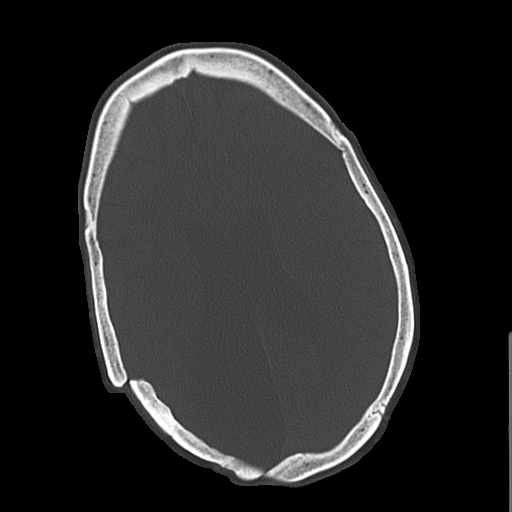
[im 33/49  bone]
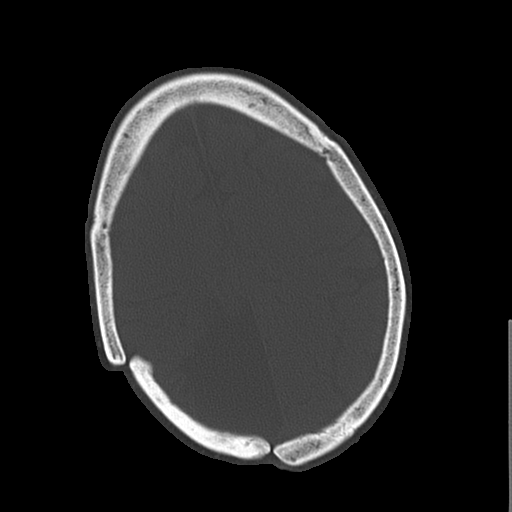
[im 38/49  bone]
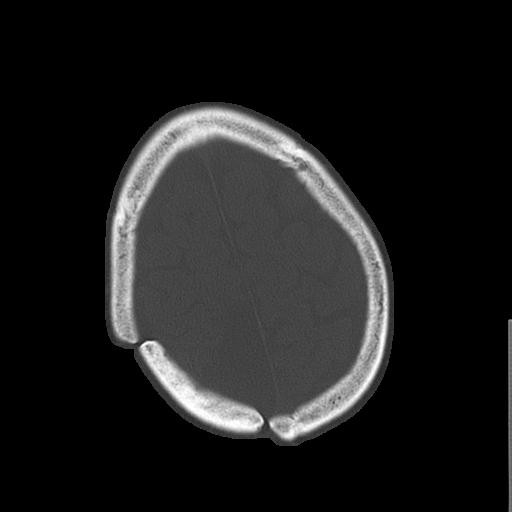

[16 of 30 positions shown; findings below may reference images not displayed]

FINDINGS: Previous right occipital craniotomy with extensive
parenchymal loss in the right occipital lobe as before.  Up to 7 mm
depression of the bone flap as before. Stable ex vacuo dilatation
of the occipital horn right lateral ventricle.  The Atherosclerotic
and physiologic intracranial calcifications. There is no evidence
of acute intracranial hemorrhage, brain edema, mass lesion, acute
infarction,   mass effect, or midline shift. Acute infarct may be
inapparent on noncontrast CT.  No other intra-axial abnormalities
are seen, and the ventricles and sulci are within normal limits in
size and symmetry.   No abnormal extra-axial fluid collections or
masses are identified.  No significant calvarial abnormality.
IMPRESSION: 1. Negative for bleed or other acute intracranial process.
2.  Stable postoperative changes.

## 2015-01-25 DIAGNOSIS — J441 Chronic obstructive pulmonary disease with (acute) exacerbation: Secondary | ICD-10-CM | POA: Diagnosis not present

## 2015-01-25 DIAGNOSIS — Z8673 Personal history of transient ischemic attack (TIA), and cerebral infarction without residual deficits: Secondary | ICD-10-CM | POA: Diagnosis not present

## 2015-01-25 DIAGNOSIS — E46 Unspecified protein-calorie malnutrition: Secondary | ICD-10-CM | POA: Diagnosis not present

## 2015-01-25 DIAGNOSIS — R569 Unspecified convulsions: Secondary | ICD-10-CM | POA: Diagnosis not present

## 2015-01-25 DIAGNOSIS — Z720 Tobacco use: Secondary | ICD-10-CM | POA: Diagnosis not present

## 2015-01-25 DIAGNOSIS — K219 Gastro-esophageal reflux disease without esophagitis: Secondary | ICD-10-CM | POA: Diagnosis not present

## 2015-01-25 DIAGNOSIS — J45909 Unspecified asthma, uncomplicated: Secondary | ICD-10-CM | POA: Diagnosis not present

## 2015-01-26 DIAGNOSIS — K219 Gastro-esophageal reflux disease without esophagitis: Secondary | ICD-10-CM | POA: Diagnosis not present

## 2015-01-26 DIAGNOSIS — E46 Unspecified protein-calorie malnutrition: Secondary | ICD-10-CM | POA: Diagnosis not present

## 2015-01-26 DIAGNOSIS — J441 Chronic obstructive pulmonary disease with (acute) exacerbation: Secondary | ICD-10-CM | POA: Diagnosis not present

## 2015-01-26 DIAGNOSIS — J45909 Unspecified asthma, uncomplicated: Secondary | ICD-10-CM | POA: Diagnosis not present

## 2015-01-26 DIAGNOSIS — R569 Unspecified convulsions: Secondary | ICD-10-CM | POA: Diagnosis not present

## 2015-01-26 DIAGNOSIS — Z8673 Personal history of transient ischemic attack (TIA), and cerebral infarction without residual deficits: Secondary | ICD-10-CM | POA: Diagnosis not present

## 2015-01-26 DIAGNOSIS — Z720 Tobacco use: Secondary | ICD-10-CM | POA: Diagnosis not present

## 2015-01-31 DIAGNOSIS — R569 Unspecified convulsions: Secondary | ICD-10-CM | POA: Diagnosis not present

## 2015-01-31 DIAGNOSIS — J45909 Unspecified asthma, uncomplicated: Secondary | ICD-10-CM | POA: Diagnosis not present

## 2015-01-31 DIAGNOSIS — J441 Chronic obstructive pulmonary disease with (acute) exacerbation: Secondary | ICD-10-CM | POA: Diagnosis not present

## 2015-01-31 DIAGNOSIS — E46 Unspecified protein-calorie malnutrition: Secondary | ICD-10-CM | POA: Diagnosis not present

## 2015-01-31 DIAGNOSIS — K219 Gastro-esophageal reflux disease without esophagitis: Secondary | ICD-10-CM | POA: Diagnosis not present

## 2015-01-31 DIAGNOSIS — Z8673 Personal history of transient ischemic attack (TIA), and cerebral infarction without residual deficits: Secondary | ICD-10-CM | POA: Diagnosis not present

## 2015-01-31 DIAGNOSIS — Z720 Tobacco use: Secondary | ICD-10-CM | POA: Diagnosis not present

## 2015-02-02 DIAGNOSIS — E46 Unspecified protein-calorie malnutrition: Secondary | ICD-10-CM | POA: Diagnosis not present

## 2015-02-02 DIAGNOSIS — K219 Gastro-esophageal reflux disease without esophagitis: Secondary | ICD-10-CM | POA: Diagnosis not present

## 2015-02-02 DIAGNOSIS — J45909 Unspecified asthma, uncomplicated: Secondary | ICD-10-CM | POA: Diagnosis not present

## 2015-02-02 DIAGNOSIS — R569 Unspecified convulsions: Secondary | ICD-10-CM | POA: Diagnosis not present

## 2015-02-02 DIAGNOSIS — Z8673 Personal history of transient ischemic attack (TIA), and cerebral infarction without residual deficits: Secondary | ICD-10-CM | POA: Diagnosis not present

## 2015-02-02 DIAGNOSIS — Z720 Tobacco use: Secondary | ICD-10-CM | POA: Diagnosis not present

## 2015-02-02 DIAGNOSIS — J441 Chronic obstructive pulmonary disease with (acute) exacerbation: Secondary | ICD-10-CM | POA: Diagnosis not present

## 2015-02-06 DIAGNOSIS — Z720 Tobacco use: Secondary | ICD-10-CM | POA: Diagnosis not present

## 2015-02-06 DIAGNOSIS — G40909 Epilepsy, unspecified, not intractable, without status epilepticus: Secondary | ICD-10-CM | POA: Diagnosis not present

## 2015-02-06 DIAGNOSIS — Z79899 Other long term (current) drug therapy: Secondary | ICD-10-CM | POA: Diagnosis not present

## 2015-02-06 DIAGNOSIS — J9811 Atelectasis: Secondary | ICD-10-CM | POA: Diagnosis not present

## 2015-02-06 DIAGNOSIS — J449 Chronic obstructive pulmonary disease, unspecified: Secondary | ICD-10-CM | POA: Diagnosis not present

## 2015-02-06 DIAGNOSIS — Z09 Encounter for follow-up examination after completed treatment for conditions other than malignant neoplasm: Secondary | ICD-10-CM | POA: Diagnosis not present

## 2015-02-06 DIAGNOSIS — N390 Urinary tract infection, site not specified: Secondary | ICD-10-CM | POA: Diagnosis not present

## 2015-02-06 DIAGNOSIS — J439 Emphysema, unspecified: Secondary | ICD-10-CM | POA: Diagnosis not present

## 2015-02-08 ENCOUNTER — Other Ambulatory Visit: Payer: Self-pay | Admitting: Internal Medicine

## 2015-02-08 DIAGNOSIS — J9811 Atelectasis: Secondary | ICD-10-CM

## 2015-02-09 DIAGNOSIS — R569 Unspecified convulsions: Secondary | ICD-10-CM | POA: Diagnosis not present

## 2015-02-09 DIAGNOSIS — J45909 Unspecified asthma, uncomplicated: Secondary | ICD-10-CM | POA: Diagnosis not present

## 2015-02-09 DIAGNOSIS — E46 Unspecified protein-calorie malnutrition: Secondary | ICD-10-CM | POA: Diagnosis not present

## 2015-02-09 DIAGNOSIS — Z8673 Personal history of transient ischemic attack (TIA), and cerebral infarction without residual deficits: Secondary | ICD-10-CM | POA: Diagnosis not present

## 2015-02-09 DIAGNOSIS — K219 Gastro-esophageal reflux disease without esophagitis: Secondary | ICD-10-CM | POA: Diagnosis not present

## 2015-02-09 DIAGNOSIS — J441 Chronic obstructive pulmonary disease with (acute) exacerbation: Secondary | ICD-10-CM | POA: Diagnosis not present

## 2015-02-09 DIAGNOSIS — Z720 Tobacco use: Secondary | ICD-10-CM | POA: Diagnosis not present

## 2015-02-12 ENCOUNTER — Other Ambulatory Visit: Payer: Commercial Managed Care - HMO

## 2015-02-13 ENCOUNTER — Other Ambulatory Visit: Payer: Commercial Managed Care - HMO

## 2015-02-13 DIAGNOSIS — J441 Chronic obstructive pulmonary disease with (acute) exacerbation: Secondary | ICD-10-CM | POA: Diagnosis not present

## 2015-02-13 DIAGNOSIS — Z720 Tobacco use: Secondary | ICD-10-CM | POA: Diagnosis not present

## 2015-02-13 DIAGNOSIS — R569 Unspecified convulsions: Secondary | ICD-10-CM | POA: Diagnosis not present

## 2015-02-13 DIAGNOSIS — K219 Gastro-esophageal reflux disease without esophagitis: Secondary | ICD-10-CM | POA: Diagnosis not present

## 2015-02-13 DIAGNOSIS — Z8673 Personal history of transient ischemic attack (TIA), and cerebral infarction without residual deficits: Secondary | ICD-10-CM | POA: Diagnosis not present

## 2015-02-13 DIAGNOSIS — E46 Unspecified protein-calorie malnutrition: Secondary | ICD-10-CM | POA: Diagnosis not present

## 2015-02-13 DIAGNOSIS — J45909 Unspecified asthma, uncomplicated: Secondary | ICD-10-CM | POA: Diagnosis not present

## 2015-02-23 DIAGNOSIS — R569 Unspecified convulsions: Secondary | ICD-10-CM | POA: Diagnosis not present

## 2015-02-23 DIAGNOSIS — J45909 Unspecified asthma, uncomplicated: Secondary | ICD-10-CM | POA: Diagnosis not present

## 2015-02-23 DIAGNOSIS — J441 Chronic obstructive pulmonary disease with (acute) exacerbation: Secondary | ICD-10-CM | POA: Diagnosis not present

## 2015-02-23 DIAGNOSIS — Z720 Tobacco use: Secondary | ICD-10-CM | POA: Diagnosis not present

## 2015-02-23 DIAGNOSIS — Z8673 Personal history of transient ischemic attack (TIA), and cerebral infarction without residual deficits: Secondary | ICD-10-CM | POA: Diagnosis not present

## 2015-02-23 DIAGNOSIS — E46 Unspecified protein-calorie malnutrition: Secondary | ICD-10-CM | POA: Diagnosis not present

## 2015-02-23 DIAGNOSIS — K219 Gastro-esophageal reflux disease without esophagitis: Secondary | ICD-10-CM | POA: Diagnosis not present

## 2015-03-04 IMAGING — CT CT HEAD W/O CM
1 of 4 series · 14 of 30 positions shown, 18 images · non-contrast
Comparison: 12/13/2012

CLINICAL DATA: Witnessed seizure today.

CT HEAD WITHOUT CONTRAST
TECHNIQUE: Contiguous axial images were obtained from the base of
the skull through the vertex without contrast.

[Series 3: recon 2: brain · axial · 0.49mm/px · z∈[+113,+261]mm · 14 of 64 slices shown, 18 images]
[im 5/64  brain]
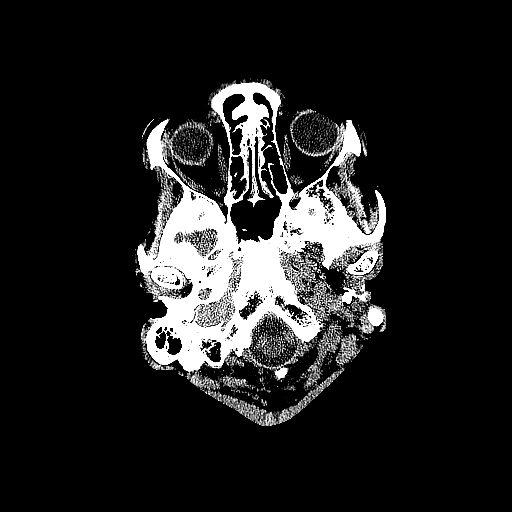
[im 5/64  bone]
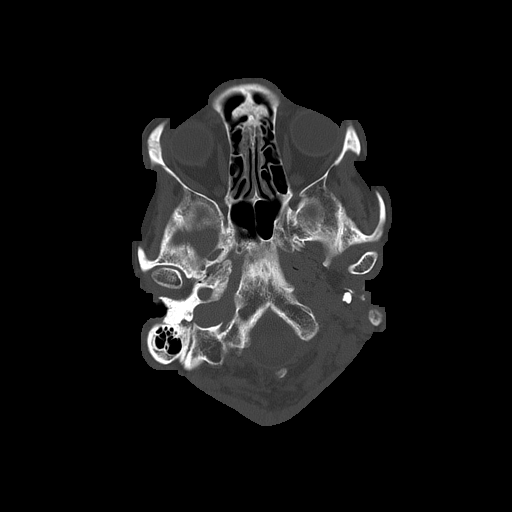
[im 9/64  brain]
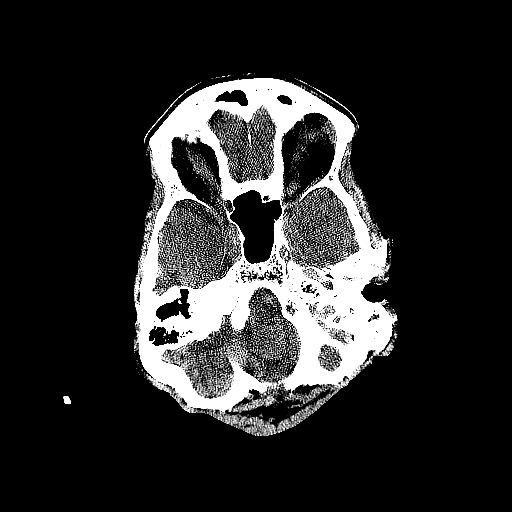
[im 13/64  brain]
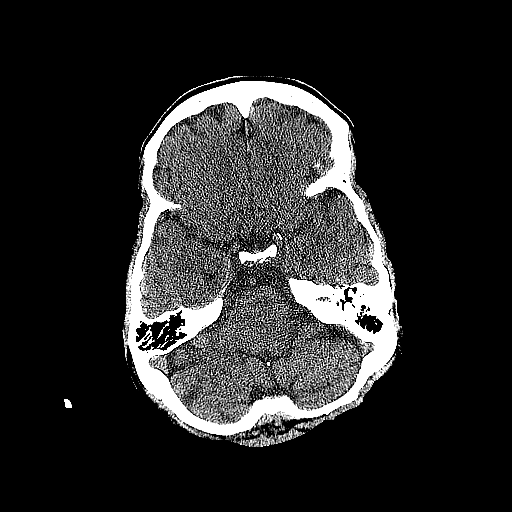
[im 17/64  brain]
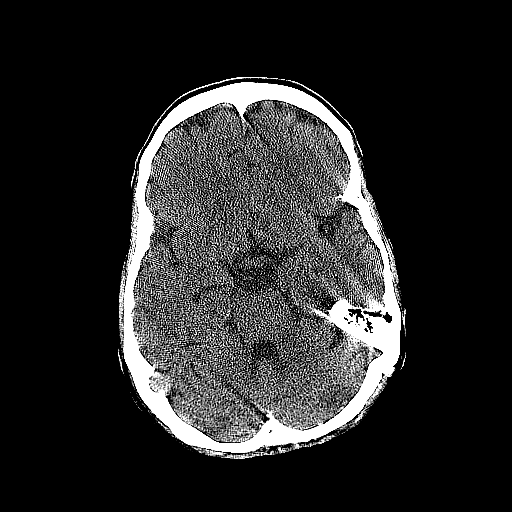
[im 22/64  brain]
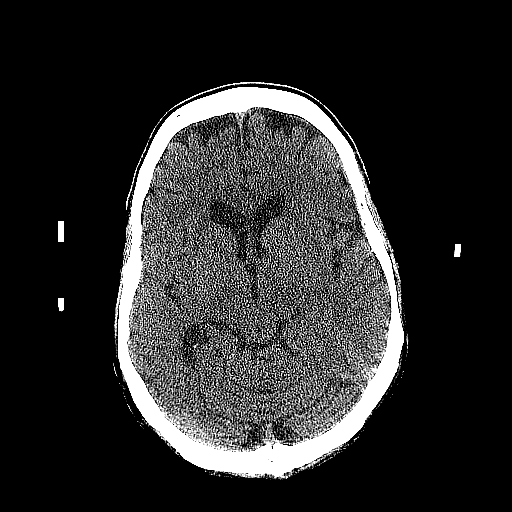
[im 22/64  bone]
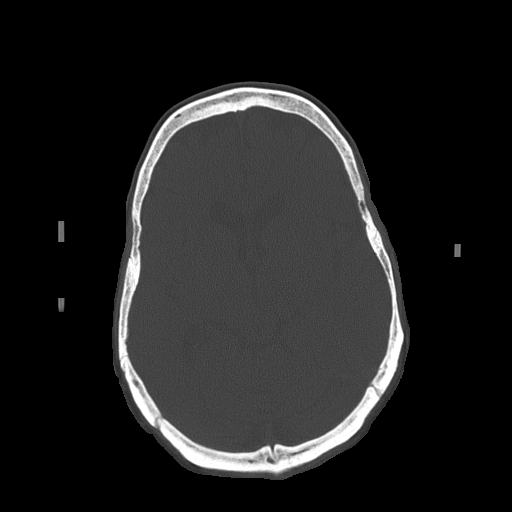
[im 26/64  brain]
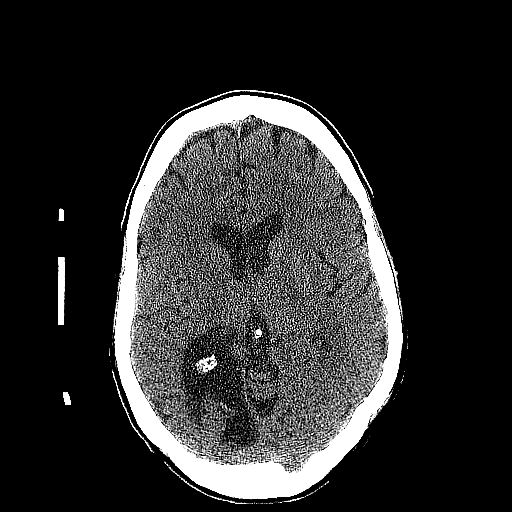
[im 30/64  brain]
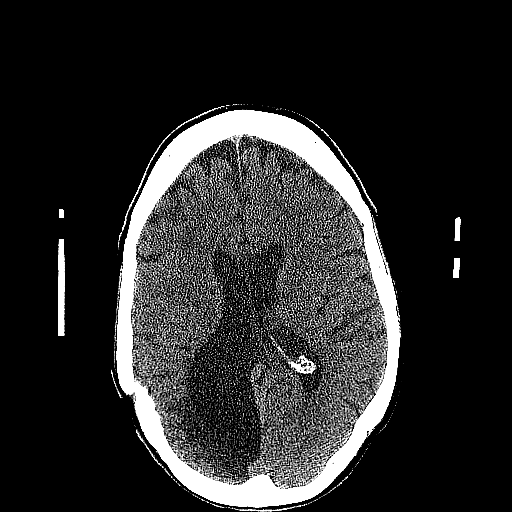
[im 34/64  brain]
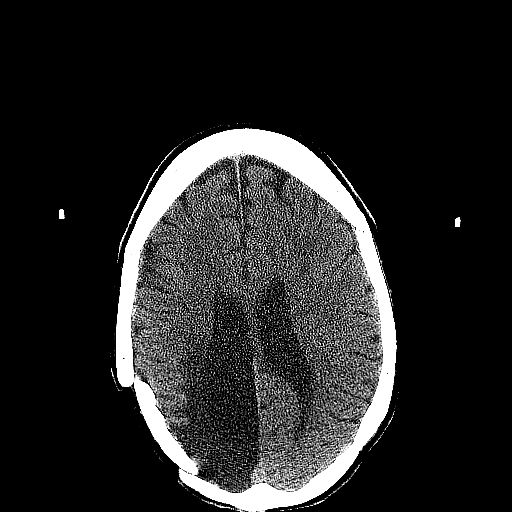
[im 38/64  brain]
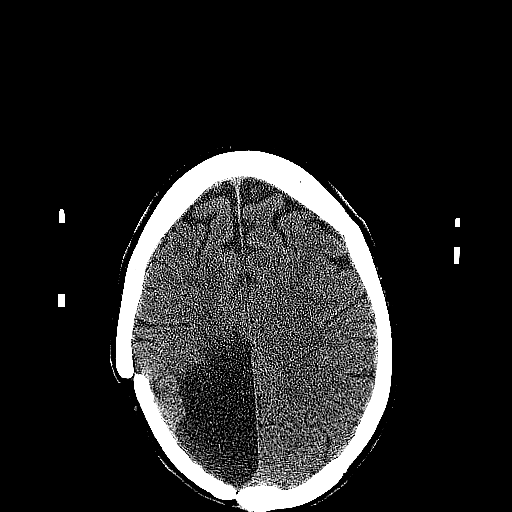
[im 38/64  bone]
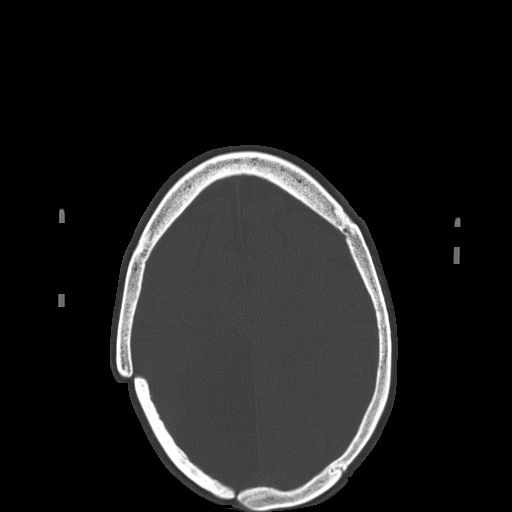
[im 43/64  brain]
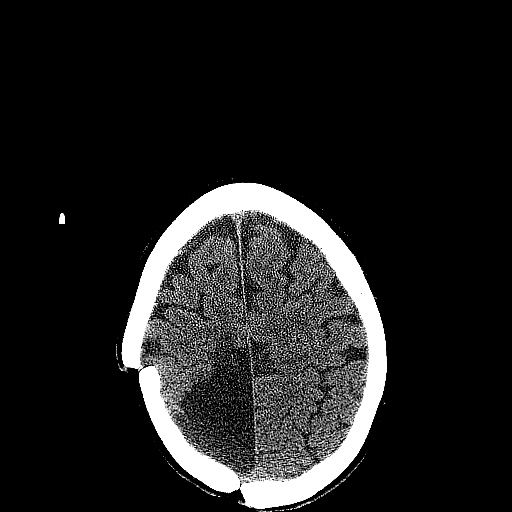
[im 47/64  brain]
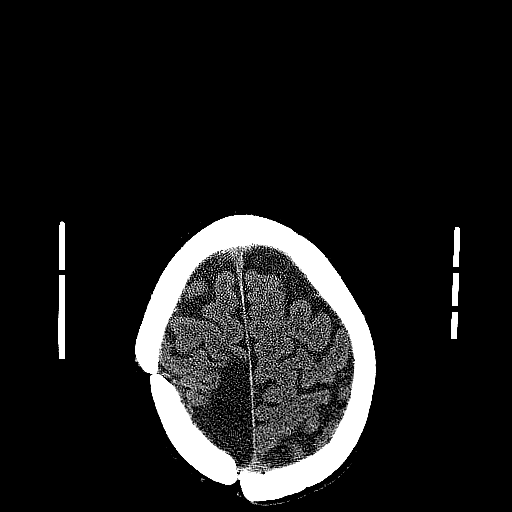
[im 51/64  brain]
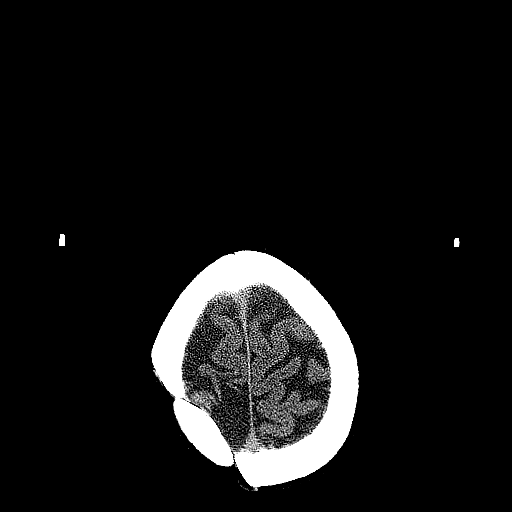
[im 55/64  brain]
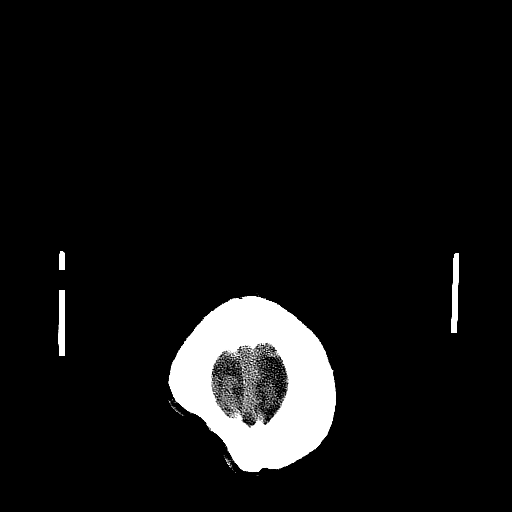
[im 55/64  bone]
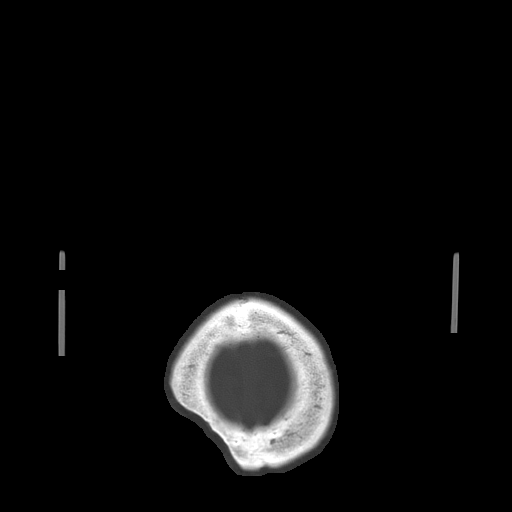
[im 59/64  brain]
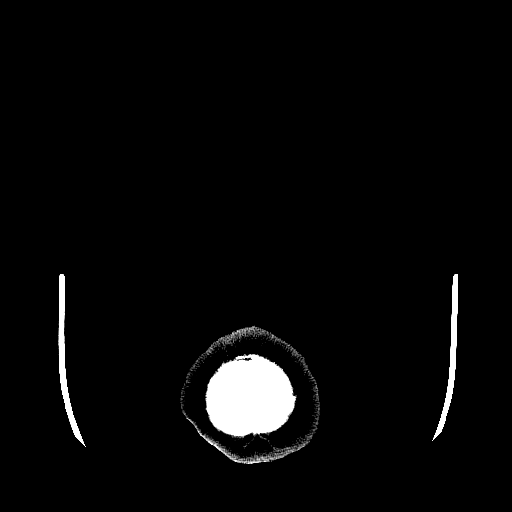

[14 of 30 positions shown; findings below may reference images not displayed]

FINDINGS: No acute stroke or hemorrhage.  No mass lesion or
hydrocephalus.  No extra-axial fluid.  There is postsurgical
encephalomalacia in the right parieto-occipital lobe status post
evacuation of an intracranial hemorrhage many years ago.  There is
depression of the bone flap, a non-acute finding.  There is
moderate vascular calcification.  Chronic left mastoid
fluid/cholesteatoma.
IMPRESSION: Stable exam.  Right parieto-occipital encephalomalacia relates to
previous surgical evacuation of a hematoma.  No acute findings.

## 2015-03-14 ENCOUNTER — Emergency Department (HOSPITAL_COMMUNITY): Payer: Commercial Managed Care - HMO

## 2015-03-14 ENCOUNTER — Emergency Department (HOSPITAL_COMMUNITY)
Admission: EM | Admit: 2015-03-14 | Discharge: 2015-03-14 | Disposition: A | Discharge status: Home / Self Care | Payer: Commercial Managed Care - HMO | Attending: Emergency Medicine | Admitting: Emergency Medicine

## 2015-03-14 ENCOUNTER — Encounter (HOSPITAL_COMMUNITY): Payer: Self-pay

## 2015-03-14 DIAGNOSIS — R5601 Complex febrile convulsions: Secondary | ICD-10-CM | POA: Diagnosis not present

## 2015-03-14 DIAGNOSIS — R0602 Shortness of breath: Secondary | ICD-10-CM | POA: Diagnosis not present

## 2015-03-14 DIAGNOSIS — Z86718 Personal history of other venous thrombosis and embolism: Secondary | ICD-10-CM | POA: Insufficient documentation

## 2015-03-14 DIAGNOSIS — Z720 Tobacco use: Secondary | ICD-10-CM | POA: Diagnosis not present

## 2015-03-14 DIAGNOSIS — G40409 Other generalized epilepsy and epileptic syndromes, not intractable, without status epilepticus: Secondary | ICD-10-CM | POA: Diagnosis not present

## 2015-03-14 DIAGNOSIS — Z8673 Personal history of transient ischemic attack (TIA), and cerebral infarction without residual deficits: Secondary | ICD-10-CM | POA: Insufficient documentation

## 2015-03-14 DIAGNOSIS — R569 Unspecified convulsions: Secondary | ICD-10-CM

## 2015-03-14 DIAGNOSIS — G40909 Epilepsy, unspecified, not intractable, without status epilepticus: Secondary | ICD-10-CM | POA: Diagnosis not present

## 2015-03-14 DIAGNOSIS — Z8739 Personal history of other diseases of the musculoskeletal system and connective tissue: Secondary | ICD-10-CM | POA: Insufficient documentation

## 2015-03-14 DIAGNOSIS — Z87442 Personal history of urinary calculi: Secondary | ICD-10-CM | POA: Diagnosis not present

## 2015-03-14 DIAGNOSIS — J449 Chronic obstructive pulmonary disease, unspecified: Secondary | ICD-10-CM | POA: Diagnosis not present

## 2015-03-14 DIAGNOSIS — J441 Chronic obstructive pulmonary disease with (acute) exacerbation: Secondary | ICD-10-CM | POA: Diagnosis not present

## 2015-03-14 DIAGNOSIS — Z79899 Other long term (current) drug therapy: Secondary | ICD-10-CM | POA: Diagnosis not present

## 2015-03-14 DIAGNOSIS — Z8719 Personal history of other diseases of the digestive system: Secondary | ICD-10-CM | POA: Insufficient documentation

## 2015-03-14 DIAGNOSIS — F17210 Nicotine dependence, cigarettes, uncomplicated: Secondary | ICD-10-CM | POA: Diagnosis not present

## 2015-03-14 DIAGNOSIS — R05 Cough: Secondary | ICD-10-CM | POA: Diagnosis not present

## 2015-03-14 DIAGNOSIS — G8929 Other chronic pain: Secondary | ICD-10-CM | POA: Insufficient documentation

## 2015-03-14 DIAGNOSIS — J45909 Unspecified asthma, uncomplicated: Secondary | ICD-10-CM | POA: Diagnosis not present

## 2015-03-14 LAB — PHENYTOIN LEVEL, TOTAL: PHENYTOIN LVL: 23.6 ug/mL — AB (ref 10.0–20.0)

## 2015-03-14 MED ORDER — LORAZEPAM 1 MG PO TABS
1.0000 mg | ORAL_TABLET | Freq: Once | ORAL | Status: AC
  Administered 2015-03-14: 1 mg via ORAL
  Filled 2015-03-14: qty 1

## 2015-03-14 MED ORDER — PREDNISONE 20 MG PO TABS
40.0000 mg | ORAL_TABLET | Freq: Once | ORAL | Status: AC
  Administered 2015-03-14: 40 mg via ORAL
  Filled 2015-03-14: qty 2

## 2015-03-14 MED ORDER — IPRATROPIUM-ALBUTEROL 0.5-2.5 (3) MG/3ML IN SOLN
3.0000 mL | RESPIRATORY_TRACT | Status: AC
  Administered 2015-03-14 (×3): 3 mL via RESPIRATORY_TRACT
  Filled 2015-03-14: qty 9

## 2015-03-14 MED ORDER — PREDNISONE 20 MG PO TABS: ORAL_TABLET | ORAL | Status: DC

## 2015-03-14 NOTE — ED Notes (Signed)
Bed: WA03 Expected date:  Expected time:  Means of arrival:  Comments: EMS-seizure 

## 2015-03-14 NOTE — ED Provider Notes (Signed)
CSN: [id number]     Arrival date & time 03/14/15  1923 History   First MD Initiated Contact with Patient 03/14/15 1948     Chief Complaint  Patient presents with  . Seizures    Seizure today approx 1hr ago.     (Consider location/radiation/quality/duration/timing/severity/associated sxs/prior Treatment) Patient is a 59 y.o. female presenting with seizures. The history is provided by the patient.  Seizures Seizure activity on arrival: no   Seizure type:  Grand mal Preceding symptoms: no sensation of an aura present   Initial focality:  None Episode characteristics: abnormal movements   Postictal symptoms: confusion   Return to baseline: yes   Severity:  Moderate Duration: unknown. Timing:  Once Progression:  Resolved Context: not change in medication   Recent head injury:  No recent head injuries PTA treatment:  None History of seizures: yes    59 yo F with a chief complaint of a seizure. Patient denies noncompliance with her medications. Patient unsure of the exact length or duration. Patient denies headache injury neck pain chest pain. Patient having increased cough and shortness breath that started after her seizure. History of asthma COPD. Unsure if increased cough or sputum production.  Past Medical History  Diagnosis Date  . DVT (deep venous thrombosis)     "she's had several since 1990"  . Asthma   . Cerebral hemorrhage 1989  . Chronic back pain   . DDD (degenerative disc disease) 06/12/2013  . Kidney stones   . Chronic bronchitis     "gets it q yr"  . History of blood transfusion     "when she was a baby"  . History of stomach ulcers   . GERD (gastroesophageal reflux disease)   . Headache     "usually around time when she's had a seizure"  . Seizures     "because of her brain surgery"  . Stroke 1990's    family denies residual on 11/09/2014  . Tunnel vision     "since brain OR"  . COPD (chronic obstructive pulmonary disease)    Past Surgical History   Procedure Laterality Date  . Cesarean section  1443; 1987; 1989  . Brain surgery  1989    craniotomy with hematoma evacuation  . Hemorrhoid surgery    . Tubal ligation  1990's   Family History  Problem Relation Age of Onset  . Heart attack Mother   . Heart attack Father    Social History  Substance Use Topics  . Smoking status: Current Every Day Smoker -- 1.50 packs/day for 40 years    Types: Cigarettes  . Smokeless tobacco: Never Used  . Alcohol Use: No   OB History    No data available     Review of Systems  Constitutional: Negative for fever and chills.  HENT: Negative for congestion and rhinorrhea.   Eyes: Negative for redness and visual disturbance.  Respiratory: Negative for shortness of breath and wheezing.   Cardiovascular: Negative for chest pain and palpitations.  Gastrointestinal: Negative for nausea and vomiting.  Genitourinary: Negative for dysuria and urgency.  Musculoskeletal: Negative for myalgias and arthralgias.  Skin: Negative for pallor and wound.  Neurological: Positive for seizures. Negative for dizziness and headaches.      Allergies  Zonisamide; Codeine; Keppra; and Olive oil  Home Medications   Prior to Admission medications   Medication Sig Start Date End Date Taking? Authorizing Provider  DILANTIN 30 MG ER capsule Take 2 capsules every morning. Take in addition  to 100 mg for total morning dose of 160 mg. 02/21/15  Yes Historical Provider, MD  escitalopram (LEXAPRO) 5 MG tablet Take 5 mg by mouth daily. 02/12/15 05/13/15 Yes Historical Provider, MD  ibuprofen (ADVIL,MOTRIN) 600 MG tablet Take 1 tablet (600 mg total) by mouth 2 (two) times daily as needed (back pain). 01/19/15  Yes Thurnell Lose, MD  lacosamide (VIMPAT) 200 MG TABS tablet Take 200 mg by mouth 2 (two) times daily.   Yes Historical Provider, MD  LORazepam (ATIVAN) 0.5 MG tablet Take 1 tablet by mouth as needed. Panic attacks. (Max 2/day.) 01/28/15  Yes Historical Provider, MD   phenytoin (DILANTIN) 100 MG ER capsule Take 2 capsules (200 mg total) by mouth 2 (two) times daily. Must get a Dilantin level checked by her primary care physician within a week Patient taking differently: Take 100-200 mg by mouth 2 (two) times daily. Take 100 mg every morning and 200 mg at bedtime. Must get a Dilantin level checked by her primary care physician within a week 01/19/15  Yes Thurnell Lose, MD  VENTOLIN HFA 108 (90 BASE) MCG/ACT inhaler Inhale 2 puffs into the lungs 4 (four) times daily as needed for wheezing or shortness of breath.  10/17/14  Yes Historical Provider, MD  Vitamin D, Ergocalciferol, (DRISDOL) 50000 UNITS CAPS capsule Take 50,000 Units by mouth once a week. On Wednesdays 12/15/14  Yes Historical Provider, MD  amoxicillin-clavulanate (AUGMENTIN) 500-125 MG per tablet Take 1 tablet (500 mg total) by mouth every 8 (eight) hours. Patient not taking: Reported on 03/14/2015 01/19/15   Thurnell Lose, MD  predniSONE (DELTASONE) 20 MG tablet 2 tabs po daily x 4 days 03/14/15   Deno Etienne, DO   BP 108/51 mmHg  Pulse 86  Temp(Src) 98.3 F (36.8 C) (Oral)  Resp 16  SpO2 91% Physical Exam  Constitutional: She is oriented to person, place, and time. She appears well-developed and well-nourished. No distress.  HENT:  Head: Normocephalic and atraumatic.  Eyes: EOM are normal. Pupils are equal, round, and reactive to light.  Neck: Normal range of motion. Neck supple.  Cardiovascular: Normal rate and regular rhythm.  Exam reveals no gallop and no friction rub.   No murmur heard. Pulmonary/Chest: Effort normal. She has decreased breath sounds. She has wheezes (diffuse end expiratory wheezes). She has no rales.  Abdominal: Soft. She exhibits no distension. There is no tenderness.  Musculoskeletal: She exhibits no edema or tenderness.  Neurological: She is alert and oriented to person, place, and time. No cranial nerve deficit. GCS eye subscore is 4. GCS verbal subscore is 5. GCS  motor subscore is 6.  Skin: Skin is warm and dry. She is not diaphoretic.  Psychiatric: She has a normal mood and affect. Her behavior is normal.    ED Course  Procedures (including critical care time) Labs Review Labs Reviewed  PHENYTOIN LEVEL, TOTAL - Abnormal; Notable for the following:    Phenytoin Lvl 23.6 (*)    All other components within normal limits    Imaging Review Dg Chest 2 View  03/14/2015   CLINICAL DATA:  Seizure. Cough and shortness of breath. History of asthma and COPD. Fall.  EXAM: CHEST  2 VIEW  COMPARISON:  02/06/2015  FINDINGS: Hyperinflation. Thoracic spondylosis with mild osteopenia. Numerous leads and wires project over the chest. Suspect remote trauma involving posterior lateral right ribs. Midline trachea. Normal heart size and mediastinal contours. No pleural effusion or pneumothorax. Clear lungs.  IMPRESSION: Hyperinflation/COPD, without acute  superimposed process.   Electronically Signed   By: Abigail Miyamoto M.D.   On: 03/14/2015 20:40     EKG Interpretation None      MDM   Final diagnoses:  Chronic obstructive pulmonary disease with acute exacerbation  Seizure    60 yo F with chief complaint seizure. Patient back to baseline on my examination. Complaining only of shortness breath and cough. Diminished breath sounds bilaterally with prolonged expiration. Will treat with 3 DuoNeb's obtain a chest x-ray. Obtain level of Dilantin.  Patient's Dilantin level slightly elevated. Chest x-ray unremarkable. Patient feeling better after 3 DuoNeb's. Will discharge the patient home with a burst dose of steroids. Return for repeat seizure-like activity.  10:34 PM:  I have discussed the diagnosis/risks/treatment options with the patient and believe the pt to be eligible for discharge home to follow-up with PCP. We also discussed returning to the ED immediately if new or worsening sx occur. We discussed the sx which are most concerning (e.g., sudden worsening sob or  repeat seizure) that necessitate immediate return. Medications administered to the patient during their visit and any new prescriptions provided to the patient are listed below.  Medications given during this visit Medications  ipratropium-albuterol (DUONEB) 0.5-2.5 (3) MG/3ML nebulizer solution 3 mL (3 mLs Nebulization Given 03/14/15 2101)  predniSONE (DELTASONE) tablet 40 mg (40 mg Oral Given 03/14/15 2046)  LORazepam (ATIVAN) tablet 1 mg (1 mg Oral Given 03/14/15 2108)    New Prescriptions   PREDNISONE (DELTASONE) 20 MG TABLET    2 tabs po daily x 4 days     The patient appears reasonably screen and/or stabilized for discharge and I doubt any other medical condition or other [facility] requiring further screening, evaluation, or treatment in the ED at this time prior to discharge.    Deno Etienne, DO 03/14/15 2234

## 2015-03-14 NOTE — ED Notes (Signed)
Respiratory at bedside.

## 2015-03-14 NOTE — ED Notes (Signed)
Respiratory notified of breathing treatment.

## 2015-03-14 NOTE — ED Notes (Signed)
Pt transported by [facility] for seizure.  Pt has hx of seizures.  Pt fell at time of seizure and is c/o mid back pain.  Pt alert/oriented at this time.

## 2015-03-30 ENCOUNTER — Emergency Department (HOSPITAL_COMMUNITY): Payer: Commercial Managed Care - HMO

## 2015-03-30 ENCOUNTER — Inpatient Hospital Stay (HOSPITAL_COMMUNITY)
Admission: EM | Admit: 2015-03-30 | Discharge: 2015-04-01 | DRG: 190 | Disposition: A | Discharge status: Home / Self Care | Payer: Commercial Managed Care - HMO | Attending: Internal Medicine | Admitting: Internal Medicine

## 2015-03-30 ENCOUNTER — Encounter (HOSPITAL_COMMUNITY): Payer: Self-pay | Admitting: *Deleted

## 2015-03-30 DIAGNOSIS — Z681 Body mass index (BMI) 19 or less, adult: Secondary | ICD-10-CM | POA: Diagnosis not present

## 2015-03-30 DIAGNOSIS — Z7952 Long term (current) use of systemic steroids: Secondary | ICD-10-CM

## 2015-03-30 DIAGNOSIS — F17210 Nicotine dependence, cigarettes, uncomplicated: Secondary | ICD-10-CM | POA: Diagnosis present

## 2015-03-30 DIAGNOSIS — J441 Chronic obstructive pulmonary disease with (acute) exacerbation: Secondary | ICD-10-CM

## 2015-03-30 DIAGNOSIS — R079 Chest pain, unspecified: Secondary | ICD-10-CM | POA: Diagnosis present

## 2015-03-30 DIAGNOSIS — F1010 Alcohol abuse, uncomplicated: Secondary | ICD-10-CM | POA: Diagnosis not present

## 2015-03-30 DIAGNOSIS — F17200 Nicotine dependence, unspecified, uncomplicated: Secondary | ICD-10-CM | POA: Diagnosis present

## 2015-03-30 DIAGNOSIS — G40909 Epilepsy, unspecified, not intractable, without status epilepticus: Secondary | ICD-10-CM | POA: Diagnosis present

## 2015-03-30 DIAGNOSIS — R51 Headache: Secondary | ICD-10-CM | POA: Diagnosis not present

## 2015-03-30 DIAGNOSIS — J45909 Unspecified asthma, uncomplicated: Secondary | ICD-10-CM | POA: Diagnosis not present

## 2015-03-30 DIAGNOSIS — F419 Anxiety disorder, unspecified: Secondary | ICD-10-CM | POA: Diagnosis present

## 2015-03-30 DIAGNOSIS — R0602 Shortness of breath: Secondary | ICD-10-CM | POA: Diagnosis not present

## 2015-03-30 DIAGNOSIS — Z86718 Personal history of other venous thrombosis and embolism: Secondary | ICD-10-CM

## 2015-03-30 DIAGNOSIS — I82409 Acute embolism and thrombosis of unspecified deep veins of unspecified lower extremity: Secondary | ICD-10-CM | POA: Diagnosis present

## 2015-03-30 DIAGNOSIS — I629 Nontraumatic intracranial hemorrhage, unspecified: Secondary | ICD-10-CM

## 2015-03-30 DIAGNOSIS — Z8711 Personal history of peptic ulcer disease: Secondary | ICD-10-CM

## 2015-03-30 DIAGNOSIS — E440 Moderate protein-calorie malnutrition: Secondary | ICD-10-CM | POA: Insufficient documentation

## 2015-03-30 DIAGNOSIS — Z79899 Other long term (current) drug therapy: Secondary | ICD-10-CM | POA: Diagnosis not present

## 2015-03-30 DIAGNOSIS — E46 Unspecified protein-calorie malnutrition: Secondary | ICD-10-CM | POA: Diagnosis present

## 2015-03-30 DIAGNOSIS — Z8673 Personal history of transient ischemic attack (TIA), and cerebral infarction without residual deficits: Secondary | ICD-10-CM | POA: Diagnosis not present

## 2015-03-30 DIAGNOSIS — F411 Generalized anxiety disorder: Secondary | ICD-10-CM | POA: Diagnosis present

## 2015-03-30 DIAGNOSIS — J449 Chronic obstructive pulmonary disease, unspecified: Secondary | ICD-10-CM | POA: Diagnosis not present

## 2015-03-30 DIAGNOSIS — R251 Tremor, unspecified: Secondary | ICD-10-CM

## 2015-03-30 DIAGNOSIS — J9621 Acute and chronic respiratory failure with hypoxia: Secondary | ICD-10-CM | POA: Diagnosis not present

## 2015-03-30 DIAGNOSIS — Z888 Allergy status to other drugs, medicaments and biological substances status: Secondary | ICD-10-CM | POA: Diagnosis not present

## 2015-03-30 DIAGNOSIS — J45901 Unspecified asthma with (acute) exacerbation: Secondary | ICD-10-CM | POA: Diagnosis not present

## 2015-03-30 DIAGNOSIS — J9601 Acute respiratory failure with hypoxia: Secondary | ICD-10-CM | POA: Diagnosis present

## 2015-03-30 HISTORY — DX: Chronic obstructive pulmonary disease with (acute) exacerbation: J44.1

## 2015-03-30 LAB — BASIC METABOLIC PANEL
Anion gap: 7 (ref 5–15)
BUN: 6 mg/dL (ref 6–20)
CHLORIDE: 106 mmol/L (ref 101–111)
CO2: 28 mmol/L (ref 22–32)
CREATININE: 0.6 mg/dL (ref 0.44–1.00)
Calcium: 9.2 mg/dL (ref 8.9–10.3)
GFR calc Af Amer: >60 mL/min (ref >60)
GFR calc non Af Amer: >60 mL/min (ref >60)
Glucose, Bld: 119 mg/dL — ABNORMAL HIGH (ref 65–99)
Potassium: 3.5 mmol/L (ref 3.5–5.1)
SODIUM: 141 mmol/L (ref 135–145)

## 2015-03-30 LAB — CBC
HCT: 43.4 % (ref 36.0–46.0)
Hemoglobin: 14.4 g/dL (ref 12.0–15.0)
MCH: 33.9 pg (ref 26.0–34.0)
MCHC: 33.2 g/dL (ref 30.0–36.0)
MCV: 102.1 fL — AB (ref 78.0–100.0)
PLATELETS: 137 10*3/uL — AB (ref 150–400)
RBC: 4.25 MIL/uL (ref 3.87–5.11)
RDW: 12.8 % (ref 11.5–15.5)
WBC: 4.3 10*3/uL (ref 4.0–10.5)

## 2015-03-30 LAB — I-STAT TROPONIN, ED: Troponin i, poc: 0 ng/mL (ref 0.00–0.08)

## 2015-03-30 MED ORDER — METHYLPREDNISOLONE SODIUM SUCC 125 MG IJ SOLR
125.0000 mg | Freq: Once | INTRAMUSCULAR | Status: AC
  Administered 2015-03-30: 125 mg via INTRAVENOUS
  Filled 2015-03-30: qty 2

## 2015-03-30 MED ORDER — NICOTINE 21 MG/24HR TD PT24
21.0000 mg | MEDICATED_PATCH | Freq: Once | TRANSDERMAL | Status: AC
  Administered 2015-03-31: 21 mg via TRANSDERMAL
  Filled 2015-03-30: qty 1

## 2015-03-30 MED ORDER — IPRATROPIUM BROMIDE 0.06 % NA SOLN
2.0000 | NASAL | Status: AC
  Administered 2015-03-30: 2 via NASAL
  Filled 2015-03-30: qty 15

## 2015-03-30 NOTE — H&P (Addendum)
[facility] History and Physical  [name] WVP:[id number] DOB: 04/03/1956 DOA: 03/30/2015  Referring physician: ED physician PCP: [name], MD  Specialists:   Chief Complaint: sob and chest pain  HPI: [name] is a 59 y.o. female with PMH of COPD, asthma, DVT, ICH, back pain, gastric ulcer, seizure, stroke, tunnel vision, anxiety, GERD, who presents with shortness of breath and chest pain.  Patient reports that she has been having worsening shortness of breath in the past 3 days. She coughs up small amount of white mucus, no fever or chills. She has right lower chest pain just above the rib cage which is aggravated by deep breath. She also has tenderness over the same area. No recent injury. No tenderness over the calf area. No recent long distant traveling. Patient does not have abdominal pain, diarrhea, symptoms of UTI, unilateral weakness. Patient's oxygen desaturated to 84% on ambulation in the emergency room.  In ED, patient was found to have a CBC 4.3, troponin negative, temperature normal, electrolytes okay. Chest x-ray showed COPD. Patient is admitted to inpatient for further evaluation and treatment.  Where does patient live?   At home  Can patient participate in ADLs?  Some   Review of Systems:   General: no fevers, chills, no changes in body weight, has poor appetite, has fatigue HEENT: no blurry vision, hearing changes or sore throat Pulm: has dyspnea, coughing, wheezing CV: has chest pain, no palpitations Abd: no nausea, vomiting, abdominal pain, diarrhea, constipation GU: no dysuria, burning on urination, increased urinary frequency, hematuria  Ext: no leg edema Neuro: no unilateral weakness, numbness, or tingling, no vision change or hearing loss Skin: no rash MSK: No muscle spasm, no deformity, no limitation of range of movement in spin Heme: No easy bruising.  Travel history: No recent long distant travel.  Allergy:  Allergies  Allergen  Reactions  . Zonisamide Other (See Comments)    Numbness and tingling over whole body  . Codeine Nausea And Vomiting  . Keppra [Levetiracetam] Other (See Comments)    Causes seizures.   [name] Oil Rash    Past Medical History  Diagnosis Date  . DVT (deep venous thrombosis)     "she's had several since 1990"  . Asthma   . Cerebral hemorrhage 1989  . Chronic back pain   . DDD (degenerative disc disease) 06/12/2013  . Kidney stones   . Chronic bronchitis     "gets it q yr"  . History of blood transfusion     "when she was a baby"  . History of stomach ulcers   . GERD (gastroesophageal reflux disease)   . Headache     "usually around time when she's had a seizure"  . Seizures     "because of her brain surgery"  . Stroke 1990's    family denies residual on 11/09/2014  . Tunnel vision     "since brain OR"  . COPD (chronic obstructive pulmonary disease)     Past Surgical History  Procedure Laterality Date  . Cesarean section  5462; 1987; 1989  . Brain surgery  1989    craniotomy with hematoma evacuation  . Hemorrhoid surgery    . Tubal ligation  1990's    Social History:  reports that she has been smoking Cigarettes.  She has a 60 pack-year smoking history. She has never used smokeless tobacco. She reports that she does not drink alcohol or use illicit drugs.  Family History:  Family History  Problem Relation Age of Onset  . Heart attack Mother   . Heart attack Father      Prior to Admission medications   Medication Sig Start Date End Date Taking? Authorizing Provider  DILANTIN 30 MG ER capsule Take 2 capsules every morning. Take in addition to 100 mg for total morning dose of 160 mg. 02/21/15  Yes Historical Provider, MD  ibuprofen (ADVIL,MOTRIN) 600 MG tablet Take 1 tablet (600 mg total) by mouth 2 (two) times daily as needed (back pain). 01/19/15  Yes Thurnell Lose, MD  lacosamide (VIMPAT) 200 MG TABS tablet Take 200 mg by mouth 2 (two) times daily.   Yes  Historical Provider, MD  LORazepam (ATIVAN) 0.5 MG tablet Take 1 tablet by mouth as needed. Panic attacks. (Max 2/day.) 01/28/15  Yes Historical Provider, MD  phenytoin (DILANTIN) 100 MG ER capsule Take 2 capsules (200 mg total) by mouth 2 (two) times daily. Must get a Dilantin level checked by her primary care physician within a week Patient taking differently: Take 100-200 mg by mouth 2 (two) times daily. Take 100 mg every morning and 200 mg at bedtime. Must get a Dilantin level checked by her primary care physician within a week 01/19/15  Yes Thurnell Lose, MD  Vitamin D, Ergocalciferol, (DRISDOL) 50000 UNITS CAPS capsule Take 50,000 Units by mouth once a week. On Wednesdays 12/15/14  Yes Historical Provider, MD  amoxicillin-clavulanate (AUGMENTIN) 500-125 MG per tablet Take 1 tablet (500 mg total) by mouth every 8 (eight) hours. Patient not taking: Reported on 03/14/2015 01/19/15   Thurnell Lose, MD  predniSONE (DELTASONE) 20 MG tablet 2 tabs po daily x 4 days Patient not taking: Reported on 03/30/2015 03/14/15   Deno Etienne, DO  VENTOLIN HFA 108 (90 BASE) MCG/ACT inhaler Inhale 2 puffs into the lungs 4 (four) times daily as needed for wheezing or shortness of breath.  10/17/14   Historical Provider, MD    Physical Exam: Filed Vitals:   03/30/15 2245 03/30/15 2300 03/31/15 0000 03/31/15 0033  BP: 124/90 126/56 111/60 85/63  Pulse: 61 68 71 64  Temp:    98 F (36.7 C)  TempSrc:    Oral  Resp: 15 26 20 21   SpO2: 89% 95% 92% 96%   General: Not in acute distress. Cachectic. HEENT:       Eyes: has tunnel vision.  no scleral icterus.       ENT: No discharge from the ears and nose, no pharynx injection, no tonsillar enlargement.        Neck: No JVD, no bruit, no mass felt. Heme: No neck lymph node enlargement. Cardiac: S1/S2, RRR, No murmurs, No gallops or rubs. Pulm: severely decreased air movement bilaterally. Has mild wheezing, No rales or rubs. There is severe tenderness over R lower chest  wall just above the rib cage. Abd: Soft, nondistended, nontender, no rebound pain, no organomegaly, BS present. Ext: No pitting leg edema bilaterally. 2+DP/PT pulse bilaterally. Musculoskeletal: No joint deformities, No joint redness or warmth, no limitation of ROM in spin. Skin: No rashes.  Neuro: Alert, oriented X3, cranial nerves II-XII grossly intact, muscle strength 5/5 in all extremities, sensation to light touch intact.  Psych: Patient is not psychotic, no suicidal or hemocidal ideation.  Labs on Admission:  Basic Metabolic Panel:  Recent Labs Lab 03/30/15 1838  NA 141  K 3.5  CL 106  CO2 28  GLUCOSE 119*  BUN 6  CREATININE 0.60  CALCIUM 9.2   Liver  Function Tests: No results for input(s): AST, ALT, ALKPHOS, BILITOT, PROT, ALBUMIN in the last 168 hours. No results for input(s): LIPASE, AMYLASE in the last 168 hours. No results for input(s): AMMONIA in the last 168 hours. CBC:  Recent Labs Lab 03/30/15 1838  WBC 4.3  HGB 14.4  HCT 43.4  MCV 102.1*  PLT 137*   Cardiac Enzymes: No results for input(s): CKTOTAL, CKMB, CKMBINDEX, TROPONINI in the last 168 hours.  BNP (last 3 results) No results for input(s): BNP in the last 8760 hours.  ProBNP (last 3 results) No results for input(s): PROBNP in the last 8760 hours.  CBG: No results for input(s): GLUCAP in the last 168 hours.  Radiological Exams on Admission: Dg Chest 2 View  03/30/2015   CLINICAL DATA:  Chest pain, shortness of breath, RIGHT side chest pain and pressure for few days, history COPD, asthma, smoking  EXAM: CHEST  2 VIEW  COMPARISON:  03/14/2015  FINDINGS: Normal heart size, mediastinal contours, and pulmonary vascularity.  LEFT nipple shadow again seen.  Emphysematous changes without infiltrate, pleural effusion or pneumothorax.  O2 tubing projects over RIGHT upper lobe.  Bones demineralized.  IMPRESSION: COPD changes without acute infiltrate.   Electronically Signed   By: Lavonia Dana M.D.   On:  03/30/2015 20:38    EKG: Independently reviewed.  Abnormal findings: EKG is in very poor quality. Will repeat EKG  Assessment/Plan Principal Problem:   Acute on chronic respiratory failure with hypoxia Active Problems:   History of Intracranial bleed   Tobacco dependence   Tremor   DVT of leg (deep venous thrombosis)   Protein calorie malnutrition   COPD exacerbation   Chest pain   SOB (shortness of breath)   Anxiety state  Acute on chronic respiratory failure with hypoxia: This is most likely due to COPD exacerbation given decreased air movements, and wheezing on auscultation. Patient has R lower chest wall pain which is likely due to musculoskeletal pain given reproducible pain on palpation, however patient has history of DVT twice. She was treated with [facility] shortly, not on anticoagulation currently due to history of ICH. Will need to r/u PE.  -will admit to tele bed -Nebulizers: scheduled Duoneb and prn albuterol -Solu-Medrol 60 mg q8h -Oral azithromycin for 5 days.  -Mucinex for cough  -Urine legionella and S. pneumococcal antigen -Follow up blood culture x2, sputum culture, Flu pcr -Stat D-dimer. If positive, will do CTA to r/o PE. -prn percocet for pain  COPD exacerbation: -see above  Tobacco abuse and Alcohol abuse: -Did counseling about importance of quitting smoking -Nicotine patch  Seizure: -Continue Dilantin and Vimpat -Check Dilantin level  Addendum: dilantin level is therapeutic, 31.2 -will hold d/c dilantin now -check dilantin in AM again  Protein calorie malnutrition-severe -Ensure  Anxiety: Stable, no suicidal or homicidal ideations. -Continue home medications: prn Ativan  DVT ppx: SQ Heparin  Code Status: Full code Family Communication: Yes, patient's daughter at bed side Disposition Plan: Admit to inpatient   Date of Service 03/31/2015    Ivor Costa [facility] Pager [phone]  If 7PM-7AM, please contact  night-coverage [email or web address] Password [email or web address] 03/31/2015, 2:30 AM

## 2015-03-30 NOTE — ED Notes (Signed)
NP Gail at the bedside.  

## 2015-03-30 NOTE — ED Notes (Signed)
Pt was able to ambulate w/o assistance. She stated she did not feel SOB until we arrived back to her room. Her O2 was 84% initially and after a minute increased to 92-94%. Pt respirations slightly increased once she stated she was SOB and slight audible wheezing was heard with slight increase of work of breathing. Pt tolerated well. Pt was always able to speak in full sentences throughout.

## 2015-03-30 NOTE — ED Provider Notes (Signed)
CSN: [id number]     Arrival date & time 03/30/15  1814 History   First MD Initiated Contact with Patient 03/30/15 1951     Chief Complaint  Patient presents with  . Shortness of Breath     (Consider location/radiation/quality/duration/timing/severity/associated sxs/prior Treatment) HPI Comments: This is a 59 year old female who appears older than stated age with a history of COPD states that for the past 2 days.  She's had increased shortness of breath and work of breathing despite the use of her inhalers.  Denies any fever.  She does have a nonproductive cough which is chronic.  Denies any recent illnesses, no nausea, vomiting, diarrhea.  She states she's been eating, per her norm, but she is slightly stressed because her mother is in the hospital with a serious heart condition. She does not use oxygen at home  Patient is a 59 y.o. female presenting with shortness of breath. The history is provided by the patient.  Shortness of Breath Severity:  Moderate Onset quality:  Gradual Timing:  Constant Progression:  Worsening Chronicity:  Chronic Context: activity   Relieved by:  Rest Worsened by:  Activity Ineffective treatments:  Inhaler Associated symptoms: cough and wheezing   Associated symptoms: no fever and no headaches   Cough:    Cough characteristics:  Non-productive   Past Medical History  Diagnosis Date  . DVT (deep venous thrombosis)     "she's had several since 1990"  . Asthma   . Cerebral hemorrhage 1989  . Chronic back pain   . DDD (degenerative disc disease) 06/12/2013  . Kidney stones   . Chronic bronchitis     "gets it q yr"  . History of blood transfusion     "when she was a baby"  . History of stomach ulcers   . GERD (gastroesophageal reflux disease)   . Headache     "usually around time when she's had a seizure"  . Seizures     "because of her brain surgery"  . Stroke 1990's    family denies residual on 11/09/2014  . Tunnel vision     "since brain OR"   . COPD (chronic obstructive pulmonary disease)    Past Surgical History  Procedure Laterality Date  . Cesarean section  6811; 1987; 1989  . Brain surgery  1989    craniotomy with hematoma evacuation  . Hemorrhoid surgery    . Tubal ligation  1990's   Family History  Problem Relation Age of Onset  . Heart attack Mother   . Heart attack Father    Social History  Substance Use Topics  . Smoking status: Current Every Day Smoker -- 1.50 packs/day for 40 years    Types: Cigarettes  . Smokeless tobacco: Never Used  . Alcohol Use: No   OB History    No data available     Review of Systems  Constitutional: Negative for fever.  Respiratory: Positive for cough, shortness of breath and wheezing.   Skin: Positive for pallor.  Neurological: Negative for dizziness and headaches.  All other systems reviewed and are negative.     Allergies  Zonisamide; Codeine; Keppra; and Olive oil  Home Medications   Prior to Admission medications   Medication Sig Start Date End Date Taking? Authorizing Provider  DILANTIN 30 MG ER capsule Take 2 capsules every morning. Take in addition to 100 mg for total morning dose of 160 mg. 02/21/15  Yes Historical Provider, MD  ibuprofen (ADVIL,MOTRIN) 600 MG tablet Take 1 tablet (  600 mg total) by mouth 2 (two) times daily as needed (back pain). 01/19/15  Yes Thurnell Lose, MD  lacosamide (VIMPAT) 200 MG TABS tablet Take 200 mg by mouth 2 (two) times daily.   Yes Historical Provider, MD  LORazepam (ATIVAN) 0.5 MG tablet Take 1 tablet by mouth as needed. Panic attacks. (Max 2/day.) 01/28/15  Yes Historical Provider, MD  phenytoin (DILANTIN) 100 MG ER capsule Take 2 capsules (200 mg total) by mouth 2 (two) times daily. Must get a Dilantin level checked by her primary care physician within a week Patient taking differently: Take 100-200 mg by mouth 2 (two) times daily. Take 100 mg every morning and 200 mg at bedtime. Must get a Dilantin level checked by her  primary care physician within a week 01/19/15  Yes Thurnell Lose, MD  Vitamin D, Ergocalciferol, (DRISDOL) 50000 UNITS CAPS capsule Take 50,000 Units by mouth once a week. On Wednesdays 12/15/14  Yes Historical Provider, MD  amoxicillin-clavulanate (AUGMENTIN) 500-125 MG per tablet Take 1 tablet (500 mg total) by mouth every 8 (eight) hours. Patient not taking: Reported on 03/14/2015 01/19/15   Thurnell Lose, MD  predniSONE (DELTASONE) 20 MG tablet 2 tabs po daily x 4 days Patient not taking: Reported on 03/30/2015 03/14/15   Deno Etienne, DO  VENTOLIN HFA 108 (90 BASE) MCG/ACT inhaler Inhale 2 puffs into the lungs 4 (four) times daily as needed for wheezing or shortness of breath.  10/17/14   Historical Provider, MD   BP 106/59 mmHg  Pulse 64  Temp(Src) 98.1 F (36.7 C) (Oral)  Resp 17  SpO2 94% Physical Exam  Constitutional: She is oriented to person, place, and time. She appears well-developed and well-nourished.  HENT:  Head: Normocephalic.  Eyes: Pupils are equal, round, and reactive to light.  Neck: Normal range of motion.  Cardiovascular: Normal rate and regular rhythm.   Pulmonary/Chest: No respiratory distress. She has no wheezes. She exhibits no tenderness.  Abdominal: Soft.  Neurological: She is alert and oriented to person, place, and time.  Skin: Skin is warm. There is pallor.  Nursing note and vitals reviewed.   ED Course  Procedures (including critical care time) Labs Review Labs Reviewed  BASIC METABOLIC PANEL - Abnormal; Notable for the following:    Glucose, Bld 119 (*)    All other components within normal limits  CBC - Abnormal; Notable for the following:    MCV 102.1 (*)    Platelets 137 (*)    All other components within normal limits  I-STAT TROPOININ, ED    Imaging Review Dg Chest 2 View  03/30/2015   CLINICAL DATA:  Chest pain, shortness of breath, RIGHT side chest pain and pressure for few days, history COPD, asthma, smoking  EXAM: CHEST  2 VIEW   COMPARISON:  03/14/2015  FINDINGS: Normal heart size, mediastinal contours, and pulmonary vascularity.  LEFT nipple shadow again seen.  Emphysematous changes without infiltrate, pleural effusion or pneumothorax.  O2 tubing projects over RIGHT upper lobe.  Bones demineralized.  IMPRESSION: COPD changes without acute infiltrate.   Electronically Signed   By: Lavonia Dana M.D.   On: 03/30/2015 20:38   I have personally reviewed and evaluated these images and lab results as part of my medical decision-making.   EKG Interpretation None     patient has worsening COPD symptoms.  She has been given respiratory inhalants without relief of her shortness of breath symptom.  She is hypoxic on ambulation.  She sats  to 84% Advil this patient needs to be admitted for continue steroidal therapies, as well as monitoring  MDM   Final diagnoses:  None         Junius Creamer, NP 03/30/15 [place], MD 03/31/15 [phone]

## 2015-03-30 NOTE — ED Notes (Signed)
Pt reports right side chest pains, having sob since yesterday and non productive cough. Denies swelling to legs.

## 2015-03-30 NOTE — ED Provider Notes (Signed)
Complains of worsening shortness of breath over the past 2 days. She denies fever. Breathing not back to baseline after treatment with up-year-old nebulizer, Solu-Medrol on exam she speaks in sentences lungs with prolonged ex triphasic for wheezes  Orlie Dakin, MD 03/30/15 2348

## 2015-03-31 ENCOUNTER — Encounter (HOSPITAL_COMMUNITY): Payer: Self-pay | Admitting: *Deleted

## 2015-03-31 DIAGNOSIS — J9601 Acute respiratory failure with hypoxia: Secondary | ICD-10-CM

## 2015-03-31 DIAGNOSIS — F1010 Alcohol abuse, uncomplicated: Secondary | ICD-10-CM | POA: Diagnosis present

## 2015-03-31 DIAGNOSIS — F411 Generalized anxiety disorder: Secondary | ICD-10-CM | POA: Diagnosis not present

## 2015-03-31 DIAGNOSIS — F17210 Nicotine dependence, cigarettes, uncomplicated: Secondary | ICD-10-CM | POA: Diagnosis present

## 2015-03-31 DIAGNOSIS — F17200 Nicotine dependence, unspecified, uncomplicated: Secondary | ICD-10-CM

## 2015-03-31 DIAGNOSIS — J45909 Unspecified asthma, uncomplicated: Secondary | ICD-10-CM | POA: Diagnosis present

## 2015-03-31 DIAGNOSIS — J9621 Acute and chronic respiratory failure with hypoxia: Secondary | ICD-10-CM | POA: Diagnosis present

## 2015-03-31 DIAGNOSIS — E46 Unspecified protein-calorie malnutrition: Secondary | ICD-10-CM | POA: Diagnosis not present

## 2015-03-31 DIAGNOSIS — R079 Chest pain, unspecified: Secondary | ICD-10-CM | POA: Diagnosis present

## 2015-03-31 DIAGNOSIS — J441 Chronic obstructive pulmonary disease with (acute) exacerbation: Secondary | ICD-10-CM | POA: Diagnosis present

## 2015-03-31 DIAGNOSIS — Z8673 Personal history of transient ischemic attack (TIA), and cerebral infarction without residual deficits: Secondary | ICD-10-CM | POA: Diagnosis not present

## 2015-03-31 DIAGNOSIS — R0602 Shortness of breath: Secondary | ICD-10-CM

## 2015-03-31 DIAGNOSIS — E440 Moderate protein-calorie malnutrition: Secondary | ICD-10-CM | POA: Diagnosis present

## 2015-03-31 DIAGNOSIS — R51 Headache: Secondary | ICD-10-CM | POA: Diagnosis present

## 2015-03-31 DIAGNOSIS — Z86718 Personal history of other venous thrombosis and embolism: Secondary | ICD-10-CM | POA: Diagnosis not present

## 2015-03-31 DIAGNOSIS — Z7952 Long term (current) use of systemic steroids: Secondary | ICD-10-CM | POA: Diagnosis not present

## 2015-03-31 DIAGNOSIS — G40909 Epilepsy, unspecified, not intractable, without status epilepticus: Secondary | ICD-10-CM | POA: Diagnosis present

## 2015-03-31 DIAGNOSIS — Z8711 Personal history of peptic ulcer disease: Secondary | ICD-10-CM | POA: Diagnosis not present

## 2015-03-31 DIAGNOSIS — I82409 Acute embolism and thrombosis of unspecified deep veins of unspecified lower extremity: Secondary | ICD-10-CM | POA: Diagnosis not present

## 2015-03-31 DIAGNOSIS — Z681 Body mass index (BMI) 19 or less, adult: Secondary | ICD-10-CM | POA: Diagnosis not present

## 2015-03-31 DIAGNOSIS — Z888 Allergy status to other drugs, medicaments and biological substances status: Secondary | ICD-10-CM | POA: Diagnosis not present

## 2015-03-31 DIAGNOSIS — Z79899 Other long term (current) drug therapy: Secondary | ICD-10-CM | POA: Diagnosis not present

## 2015-03-31 HISTORY — DX: Acute respiratory failure with hypoxia: J96.01

## 2015-03-31 LAB — INFLUENZA PANEL BY PCR (TYPE A & B)
H1N1FLUPCR: NOT DETECTED
INFLBPCR: NEGATIVE
Influenza A By PCR: NEGATIVE

## 2015-03-31 LAB — HIV ANTIBODY (ROUTINE TESTING W REFLEX): HIV SCREEN 4TH GENERATION: NONREACTIVE

## 2015-03-31 LAB — PHENYTOIN LEVEL, TOTAL
Phenytoin Lvl: 31.2 ug/mL (ref 10.0–20.0)
Phenytoin Lvl: 26.6 ug/mL — ABNORMAL HIGH (ref 10.0–20.0)

## 2015-03-31 LAB — D-DIMER, QUANTITATIVE: D-Dimer, Quant: 0.29 ug/mL-FEU (ref 0.00–0.48)

## 2015-03-31 LAB — STREP PNEUMONIAE URINARY ANTIGEN: STREP PNEUMO URINARY ANTIGEN: NEGATIVE

## 2015-03-31 MED ORDER — LORAZEPAM 0.5 MG PO TABS: 0.5000 mg | ORAL_TABLET | Freq: Three times a day (TID) | ORAL | Status: DC | PRN

## 2015-03-31 MED ORDER — AZITHROMYCIN 500 MG PO TABS
250.0000 mg | ORAL_TABLET | Freq: Every day | ORAL | Status: DC
  Administered 2015-04-01: 250 mg via ORAL
  Filled 2015-03-31: qty 1

## 2015-03-31 MED ORDER — AZITHROMYCIN 500 MG PO TABS
500.0000 mg | ORAL_TABLET | Freq: Every day | ORAL | Status: AC
  Administered 2015-03-31: 500 mg via ORAL
  Filled 2015-03-31: qty 1

## 2015-03-31 MED ORDER — OXYCODONE-ACETAMINOPHEN 5-325 MG PO TABS
1.0000 | ORAL_TABLET | ORAL | Status: DC | PRN
  Administered 2015-03-31 (×2): 1 via ORAL
  Filled 2015-03-31 (×2): qty 1

## 2015-03-31 MED ORDER — PHENYTOIN SODIUM EXTENDED 100 MG PO CAPS: 100.0000 mg | ORAL_CAPSULE | Freq: Two times a day (BID) | ORAL | Status: DC

## 2015-03-31 MED ORDER — HEPARIN SODIUM (PORCINE) 5000 UNIT/ML IJ SOLN
5000.0000 [IU] | Freq: Three times a day (TID) | INTRAMUSCULAR | Status: DC
  Administered 2015-03-31 – 2015-04-01 (×4): 5000 [IU] via SUBCUTANEOUS
  Filled 2015-03-31 (×4): qty 1

## 2015-03-31 MED ORDER — PHENYTOIN SODIUM EXTENDED 100 MG PO CAPS: 200.0000 mg | ORAL_CAPSULE | Freq: Every day | ORAL | Status: DC

## 2015-03-31 MED ORDER — LORAZEPAM 0.5 MG PO TABS: 0.5000 mg | ORAL_TABLET | Freq: Two times a day (BID) | ORAL | Status: DC | PRN

## 2015-03-31 MED ORDER — IBUPROFEN 600 MG PO TABS: 600.0000 mg | ORAL_TABLET | Freq: Two times a day (BID) | ORAL | Status: DC | PRN

## 2015-03-31 MED ORDER — SODIUM CHLORIDE 0.9 % IV SOLN
INTRAVENOUS | Status: DC
  Administered 2015-04-01: 02:00:00 via INTRAVENOUS

## 2015-03-31 MED ORDER — IPRATROPIUM-ALBUTEROL 0.5-2.5 (3) MG/3ML IN SOLN
3.0000 mL | RESPIRATORY_TRACT | Status: DC
  Administered 2015-03-31: 3 mL via RESPIRATORY_TRACT
  Filled 2015-03-31: qty 3

## 2015-03-31 MED ORDER — SODIUM CHLORIDE 0.9 % IV BOLUS (SEPSIS): 500.0000 mL | INTRAVENOUS | Status: DC | PRN

## 2015-03-31 MED ORDER — CETYLPYRIDINIUM CHLORIDE 0.05 % MT LIQD
7.0000 mL | Freq: Two times a day (BID) | OROMUCOSAL | Status: DC
  Administered 2015-03-31 – 2015-04-01 (×4): 7 mL via OROMUCOSAL

## 2015-03-31 MED ORDER — SODIUM CHLORIDE 0.9 % IV BOLUS (SEPSIS)
1000.0000 mL | Freq: Once | INTRAVENOUS | Status: AC
  Administered 2015-03-31: 1000 mL via INTRAVENOUS

## 2015-03-31 MED ORDER — PHENYTOIN SODIUM EXTENDED 100 MG PO CAPS: 100.0000 mg | ORAL_CAPSULE | Freq: Every day | ORAL | Status: DC

## 2015-03-31 MED ORDER — METHYLPREDNISOLONE SODIUM SUCC 125 MG IJ SOLR
60.0000 mg | Freq: Three times a day (TID) | INTRAMUSCULAR | Status: DC
  Administered 2015-03-31 – 2015-04-01 (×4): 60 mg via INTRAVENOUS
  Filled 2015-03-31 (×4): qty 2

## 2015-03-31 MED ORDER — PHENYTOIN SODIUM EXTENDED 30 MG PO CAPS
60.0000 mg | ORAL_CAPSULE | Freq: Every morning | ORAL | Status: DC
  Filled 2015-03-31: qty 2

## 2015-03-31 MED ORDER — ALBUTEROL SULFATE (2.5 MG/3ML) 0.083% IN NEBU: 2.5000 mg | INHALATION_SOLUTION | RESPIRATORY_TRACT | Status: DC | PRN

## 2015-03-31 MED ORDER — ENSURE ENLIVE PO LIQD
237.0000 mL | Freq: Two times a day (BID) | ORAL | Status: DC
  Administered 2015-03-31 – 2015-04-01 (×3): 237 mL via ORAL

## 2015-03-31 MED ORDER — IPRATROPIUM-ALBUTEROL 0.5-2.5 (3) MG/3ML IN SOLN
3.0000 mL | Freq: Four times a day (QID) | RESPIRATORY_TRACT | Status: DC
  Administered 2015-03-31: 3 mL via RESPIRATORY_TRACT
  Filled 2015-03-31: qty 3

## 2015-03-31 MED ORDER — METHYLPREDNISOLONE SODIUM SUCC 125 MG IJ SOLR: 60.0000 mg | Freq: Three times a day (TID) | INTRAMUSCULAR | Status: DC

## 2015-03-31 MED ORDER — INFLUENZA VAC SPLIT QUAD 0.5 ML IM SUSY
0.5000 mL | PREFILLED_SYRINGE | INTRAMUSCULAR | Status: AC
  Administered 2015-04-01: 0.5 mL via INTRAMUSCULAR
  Filled 2015-03-31: qty 0.5

## 2015-03-31 MED ORDER — DM-GUAIFENESIN ER 30-600 MG PO TB12
1.0000 | ORAL_TABLET | Freq: Two times a day (BID) | ORAL | Status: DC
  Administered 2015-03-31 – 2015-04-01 (×4): 1 via ORAL
  Filled 2015-03-31 (×4): qty 1

## 2015-03-31 MED ORDER — LACOSAMIDE 50 MG PO TABS
200.0000 mg | ORAL_TABLET | Freq: Two times a day (BID) | ORAL | Status: DC
  Administered 2015-03-31 – 2015-04-01 (×3): 200 mg via ORAL
  Filled 2015-03-31 (×3): qty 4

## 2015-03-31 NOTE — Accreditation Note (Signed)
CRITICAL VALUE ALERT  Critical value received: Dilantin level=31.2  Date of notification:  03/31/15  Time of notification:  0327  Critical value read back:Yes.    Nurse who received alert:  Jorje Guild  MD notified (1st page):  Dr.Niu  Time of first page:  0330  MD notified (2nd page):  Time of second page:  Responding MD:  Dr.Niu  Time MD responded:  [phone]

## 2015-03-31 NOTE — Evaluation (Signed)
Physical Therapy Evaluation Patient Details Name: [name] MRN: [id number] DOB: 04/05/1956 Today's Date: 03/31/2015   History of Present Illness  Patient is a 59 y/o female presents with SOB and chest pain. PMH includes seizures, CVA, DVT, asthma, COPD, cerebral hemorrhage. Chest x-ray shows COPD with no acute infiltration.  Clinical Impression  Patient presents with chest pain (right sided described as burning) unchanged with activity, decreased endurance and drop in sa02 with mobility. 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 Encouraged OOB and ambulation daily with tech and use of spirometer. Pt ambulating Min guard for safety. Anticipate balance and mobility will improve quickly with increased mobility. Will follow acutely to maximize independence and mobility prior to return home.    Follow Up Recommendations Supervision - Intermittent;No PT follow up    Equipment Recommendations  None recommended by PT    Recommendations for Other Services       Precautions / Restrictions Precautions Precautions: None Precaution Comments: monitor 02 Restrictions Weight Bearing Restrictions: No      Mobility  Bed Mobility Overal bed mobility: Modified Independent                Transfers Overall transfer level: Needs assistance Equipment used: None Transfers: Sit to/from Stand Sit to Stand: Supervision         General transfer comment: Supervision for safety.   Ambulation/Gait Ambulation/Gait assistance: Supervision Ambulation Distance (Feet): 150 Feet Assistive device: None Gait Pattern/deviations: Step-through pattern;Decreased stride length;Narrow base of support   Gait velocity interpretation: <1.8 ft/sec, indicative of risk for recurrent falls General Gait Details: slow, guarded gait. Sa02 88% on 2L /min 02. No dyspnea. No change in right sided chest pain.  Stairs            Wheelchair Mobility    Modified Rankin (Stroke Patients Only)        Balance Overall balance assessment: Needs assistance Sitting-balance support: Feet supported;No upper extremity supported Sitting balance-Leahy Scale: Good Sitting balance - Comments: Able to reach outside BoS and donn socks without difficulty.    Standing balance support: During functional activity Standing balance-Leahy Scale: Fair                               Pertinent Vitals/Pain Pain Assessment: Faces Faces Pain Scale: Hurts even more Pain Location: throat, right chest under breast Pain Descriptors / Indicators: Burning Pain Intervention(s): Monitored during session;Repositioned    Home Living Family/patient expects to be discharged to:: Private residence Living Arrangements: Parent   Type of Home: House Home Access: Stairs to enter   Technical brewer of Steps: 1 Home Layout: One level Home Equipment: None      Prior Function Level of Independence: Independent         Comments: 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     Hand Dominance        Extremity/Trunk Assessment   Upper Extremity Assessment: Defer to OT evaluation           Lower Extremity Assessment: Overall WFL for tasks assessed         Communication   Communication: No difficulties  Cognition Arousal/Alertness: Awake/alert Behavior During Therapy: WFL for tasks assessed/performed Overall Cognitive Status: Within Functional Limits for tasks assessed                      General Comments      Exercises  Assessment/Plan    PT Assessment Patient needs continued PT services  PT Diagnosis Difficulty walking;Acute pain   PT Problem List Cardiopulmonary status limiting activity;Pain;Decreased mobility;Decreased activity tolerance  PT Treatment Interventions Balance training;Gait training;Functional mobility training;Therapeutic activities;Therapeutic exercise;Patient/family education;Stair training   PT Goals (Current goals can be found in the  Care Plan section) Acute Rehab PT Goals Patient Stated Goal: to be able to breathe PT Goal Formulation: With patient Time For Goal Achievement: 04/14/15 Potential to Achieve Goals: Good    Frequency Min 3X/week   Barriers to discharge Decreased caregiver support      Co-evaluation               End of Session Equipment Utilized During Treatment: Gait belt;Oxygen Activity Tolerance: Treatment limited secondary to medical complications (Comment) (drop in Sa02.) Patient left: in bed;with call bell/phone within reach;with bed alarm set Nurse Communication: Mobility status         Time: 1336-1400 PT Time Calculation (min) (ACUTE ONLY): 24 min   Charges:   PT Evaluation $Initial PT Evaluation Tier I: 1 Procedure PT Treatments $Gait Training: 8-22 mins   PT G Codes:        [name] 03/31/2015, 2:15 PM [name], [name], DPT [phone]

## 2015-03-31 NOTE — Progress Notes (Signed)
[facility]                                                                              Patient Demographics  [name], is a 59 y.o. female, DOB - [birth year], HMC:[id number]  Admit date - 03/30/2015   Admitting Physician Ivor Costa, MD  Outpatient Primary MD for the patient is [name], MD  LOS - 0   Chief Complaint  Patient presents with  . Shortness of Breath      HPI on 03/30/2015 by Dr. Ivor Costa [name] is a 59 y.o. female with PMH of COPD, asthma, DVT, ICH, back pain, gastric ulcer, seizure, stroke, tunnel vision, anxiety, GERD, who presents with shortness of breath and chest pain.  Patient reports that she has been having worsening shortness of breath in the past 3 days. She coughs up small amount of white mucus, no fever or chills. She has right lower chest pain just above the rib cage which is aggravated by deep breath. She also has tenderness over the same area. No recent injury. No tenderness over the calf area. No recent long distant traveling. Patient does not have abdominal pain, diarrhea, symptoms of UTI, unilateral weakness. Patient's oxygen desaturated to 84% on ambulation in the emergency room. In ED, patient was found to have a CBC 4.3, troponin negative, temperature normal, electrolytes okay. Chest x-ray showed COPD. Patient is admitted to inpatient for further evaluation and treatment.  Assessment & Plan   Acute on chronic respiratory failure with hypoxia/COPD exacerbation -Patient continues to have coarse breath sounds with wheezing on exam -Chest x-ray shows COPD with no acute infiltration -Patient currently afebrile with no leukocytosis -Continue supplemental oxygen to maintain saturation above 92% -Continue Mucinex, Solu-Medrol, azithromycin, and then treatment -Will order incentive spirometry as well -D-dimer 0.29, WNL  Tobacco and alcohol abuse -Continue nicotine patch -Discussed smoking cessation with patient -Monitor for alcohol  withdrawal  Seizure disorder -Continue Vimpat -Dilantin currently held, Dilantin level 31.2  Protein calorie malnutrition -Nutrition consulted  Anxiety -Continue Ativan as needed  Headache -Continue pain control as needed  Code Status: Full  Family Communication: None at bedside  Disposition Plan: Admitted. Pending improvement in respiratory status  Time Spent in minutes   30 minutes  Procedures  None  Consults   None  DVT Prophylaxis  heparin  Lab Results  Component Value Date   PLT 137* 03/30/2015    Medications  Scheduled Meds: . antiseptic oral rinse  7 mL Mouth Rinse BID  . [START ON 04/01/2015] azithromycin  250 mg Oral Daily  . dextromethorphan-guaiFENesin  1 tablet Oral BID  . feeding supplement (ENSURE ENLIVE)  237 mL Oral BID BM  . heparin  5,000 Units Subcutaneous 3 times per day  . [START ON 04/01/2015] Influenza vac split quadrivalent PF  0.5 mL Intramuscular Tomorrow-1000  . lacosamide  200 mg Oral BID  . methylPREDNISolone (SOLU-MEDROL) injection  60 mg Intravenous 3 times per day  . nicotine  21 mg Transdermal Once   Continuous Infusions: . sodium chloride 100 mL (03/31/15 0058)   PRN Meds:.albuterol, ibuprofen, LORazepam, oxyCODONE-acetaminophen, sodium chloride  Antibiotics    Anti-infectives    Start  Dose/Rate Route Frequency Ordered Stop   04/01/15 1000  azithromycin (ZITHROMAX) tablet 250 mg     250 mg Oral Daily 03/31/15 0032 04/05/15 0959   03/31/15 1000  azithromycin (ZITHROMAX) tablet 500 mg     500 mg Oral Daily 03/31/15 0032 03/31/15 1059        Subjective:   [name] seen and examined today.  Patient continues to feel shortness of breath and pain especially in the sternal area. Patient continues to cough however denies any sputum production. Patient complains of headache.   Objective:   Filed Vitals:   03/31/15 0514 03/31/15 0558 03/31/15 0844 03/31/15 0900  BP: 101/53   100/58  Pulse: 76   78  Temp: 98.2  F (36.8 C)   98.4 F (36.9 C)  TempSrc: Oral   Oral  Resp: 14   16  Height:    5\' 7"  (1.702 m)  Weight:    52.9 kg (116 lb 10 oz)  SpO2: 95% 92% 90% 92%    Wt Readings from Last 3 Encounters:  03/31/15 52.9 kg (116 lb 10 oz)  01/18/15 48.5 kg (106 lb 14.8 oz)  01/10/15 50.803 kg (112 lb)     Intake/Output Summary (Last 24 hours) at 03/31/15 1401 Last data filed at 03/31/15 1104  Gross per 24 hour  Intake      0 ml  Output   1000 ml  Net  -1000 ml    Exam  General: Well developed, thin  HEENT: NCAT, mucous membranes moist.   Cardiovascular: S1 S2 auscultated, no rubs, murmurs or gallops. Regular rate and rhythm.  Respiratory: Coarse breath sounds, with expiratory wheezing. Cough.  Abdomen: Soft, nontender, nondistended, + bowel sounds  Extremities: warm dry without cyanosis clubbing or edema  Neuro: AAOx3, nonfocal  Data Review   Micro Results No results found for this or any previous visit (from the past 240 hour(s)).  Radiology Reports Dg Chest 2 View  03/30/2015   CLINICAL DATA:  Chest pain, shortness of breath, RIGHT side chest pain and pressure for few days, history COPD, asthma, smoking  EXAM: CHEST  2 VIEW  COMPARISON:  03/14/2015  FINDINGS: Normal heart size, mediastinal contours, and pulmonary vascularity.  LEFT nipple shadow again seen.  Emphysematous changes without infiltrate, pleural effusion or pneumothorax.  O2 tubing projects over RIGHT upper lobe.  Bones demineralized.  IMPRESSION: COPD changes without acute infiltrate.   Electronically Signed   By: Lavonia Dana M.D.   On: 03/30/2015 20:38   Dg Chest 2 View  03/14/2015   CLINICAL DATA:  Seizure. Cough and shortness of breath. History of asthma and COPD. Fall.  EXAM: CHEST  2 VIEW  COMPARISON:  02/06/2015  FINDINGS: Hyperinflation. Thoracic spondylosis with mild osteopenia. Numerous leads and wires project over the chest. Suspect remote trauma involving posterior lateral right ribs. Midline trachea.  Normal heart size and mediastinal contours. No pleural effusion or pneumothorax. Clear lungs.  IMPRESSION: Hyperinflation/COPD, without acute superimposed process.   Electronically Signed   By: Abigail Miyamoto M.D.   On: 03/14/2015 20:40    CBC  Recent Labs Lab 03/30/15 1838  WBC 4.3  HGB 14.4  HCT 43.4  PLT 137*  MCV 102.1*  MCH 33.9  MCHC 33.2  RDW 12.8    Chemistries   Recent Labs Lab 03/30/15 1838  NA 141  K 3.5  CL 106  CO2 28  GLUCOSE 119*  BUN 6  CREATININE 0.60  CALCIUM 9.2   ------------------------------------------------------------------------------------------------------------------ estimated  creatinine clearance is 64.8 mL/min (by C-G formula based on Cr of 0.6). ------------------------------------------------------------------------------------------------------------------ No results for input(s): HGBA1C in the last 72 hours. ------------------------------------------------------------------------------------------------------------------ No results for input(s): CHOL, HDL, LDLCALC, TRIG, CHOLHDL, LDLDIRECT in the last 72 hours. ------------------------------------------------------------------------------------------------------------------ No results for input(s): TSH, T4TOTAL, T3FREE, THYROIDAB in the last 72 hours.  Invalid input(s): FREET3 ------------------------------------------------------------------------------------------------------------------ No results for input(s): VITAMINB12, FOLATE, FERRITIN, TIBC, IRON, RETICCTPCT in the last 72 hours.  Coagulation profile No results for input(s): INR, PROTIME in the last 168 hours.   Recent Labs  03/31/15 0226  DDIMER 0.29    Cardiac Enzymes No results for input(s): CKMB, TROPONINI, MYOGLOBIN in the last 168 hours.  Invalid input(s): CK ------------------------------------------------------------------------------------------------------------------ Invalid input(s):  POCBNP    [name] D.O. on 03/31/2015 at 2:01 PM  Between 7am to 7pm - Pager - [phone]  After 7pm go to [email or web address] - password [email or web address]  And look for the night coverage person covering for me after hours  [facility] Office  [phone]

## 2015-03-31 NOTE — Progress Notes (Signed)
PT Cancellation Note  Patient Details Name: [name] MRN: [id number] DOB: May 21, 1956   Cancelled Treatment:    Reason Eval/Treat Not Completed: Patient not medically ready Pt on bedrest with bathroom privileges until 12:30 pm. Will follow up next available time.   [name] 03/31/2015, 9:23 AM Wray Kearns, PT, DPT [phone]

## 2015-03-31 NOTE — Progress Notes (Signed)
Initial Nutrition Assessment  DOCUMENTATION CODES:  Non-severe (moderate) malnutrition in context of chronic illness, Underweight  INTERVENTION:  Ensure Enlive po BID, each supplement provides 350 kcal and 20 grams of protein  NUTRITION DIAGNOSIS:  Increased nutrient needs related to chronic illness, catabolic illness as evidenced by severe depletion of body fat, mild depletion of muscle mass.  GOAL:  Patient will meet greater than or equal to 90% of their needs  MONITOR:  PO intake, Supplement acceptance, Weight trends, Labs, I & O's  REASON FOR ASSESSMENT:  Malnutrition Screening Tool    ASSESSMENT:  59 y.o. female with PMH of COPD, asthma, DVT, ICH, back pain, gastric ulcer, seizure, stroke, tunnel vision, anxiety, GERD, who presents with SOB and chest pain.  Patient was extremely anxious when I spoke with her and it was very difficult to keep her focused on nutrition.   She was worried because of her weight measurement a couple months ago that was 106 lbs. She was confused and thought she was weighed at 106 lbs THIS admission.  She was upset because she thought she had been eating well.   She states that her appetite comes in goes. She has been very anxious/stressed out recently and she asked if this could be why she doesn't have an appetite. When she does have an appetite she does eat 3 meals a day. She tries to watch her sweets/salt intake.   She states her normal weight is 115 lbs and was relieved when I told her she was weighed at 116 lbs this admission.   She kept asking what could be causing her poor appetite/wt loss and I told her that COPD/asthma as well as her stress/anxiety can have that affect.   She was agreeable to Ensure   Diet Order:  Diet Heart Room service appropriate?: Yes; Fluid consistency:: Thin  Skin:  Reviewed, no issues  Last BM:  8/25  Height:  Ht Readings from Last 1 Encounters:  03/31/15 5\' 7"  (1.702 m)   Weight:  Wt Readings from Last 1  Encounters:  03/31/15 116 lb 10 oz (52.9 kg)   Wt Readings from Last 10 Encounters:  03/31/15 116 lb 10 oz (52.9 kg)  01/18/15 106 lb 14.8 oz (48.5 kg)  01/10/15 112 lb (50.803 kg)  11/09/14 110 lb 7.2 oz (50.1 kg)  09/12/14 118 lb (53.524 kg)  05/14/14 122 lb (55.339 kg)  06/17/13 119 lb 4.3 oz (54.1 kg)  06/12/13 134 lb 7.7 oz (61 kg)  06/09/13 120 lb (54.432 kg)  04/15/13 114 lb 10.2 oz (52 kg)   Ideal Body Weight:  61.36 kg  BMI:  Body mass index is 18.26 kg/(m^2).  Estimated Nutritional Needs:  Kcal:  1600-1800 kcals (30-34 kcal/kg) Protein:  64-74 g (1.2-1.4 g/kg bw) Fluid:  1.6-1.8 liters  EDUCATION NEEDS:  No education needs identified at this time  Burtis Junes RD, LDN Nutrition Pager: [phone] 03/31/2015 11:18 AM

## 2015-03-31 NOTE — Progress Notes (Signed)
Admitted pt.from ED,alert,oriented x 4 ;SOB on exertion noted.V/S taken & recorded.Saline lock in place .with dsd  D/I.No redness or swelling noted.Oriented pt.to the room,call bell & Patient information guide given to pt.Admission INP armband ID verified w/ pt.& applied.Fall assessment completed & pt.is able to verbalize understanding to call the nurse before getting OOB.Call bell within reach.  Skin dry & intact.Will continue to monitor pt.

## 2015-04-01 DIAGNOSIS — R51 Headache: Secondary | ICD-10-CM | POA: Diagnosis not present

## 2015-04-01 DIAGNOSIS — Z681 Body mass index (BMI) 19 or less, adult: Secondary | ICD-10-CM | POA: Diagnosis not present

## 2015-04-01 DIAGNOSIS — E440 Moderate protein-calorie malnutrition: Secondary | ICD-10-CM

## 2015-04-01 DIAGNOSIS — I82409 Acute embolism and thrombosis of unspecified deep veins of unspecified lower extremity: Secondary | ICD-10-CM

## 2015-04-01 DIAGNOSIS — G40909 Epilepsy, unspecified, not intractable, without status epilepticus: Secondary | ICD-10-CM | POA: Diagnosis not present

## 2015-04-01 DIAGNOSIS — J441 Chronic obstructive pulmonary disease with (acute) exacerbation: Secondary | ICD-10-CM | POA: Diagnosis not present

## 2015-04-01 DIAGNOSIS — Z8673 Personal history of transient ischemic attack (TIA), and cerebral infarction without residual deficits: Secondary | ICD-10-CM | POA: Diagnosis not present

## 2015-04-01 DIAGNOSIS — Z86718 Personal history of other venous thrombosis and embolism: Secondary | ICD-10-CM | POA: Diagnosis not present

## 2015-04-01 DIAGNOSIS — F1010 Alcohol abuse, uncomplicated: Secondary | ICD-10-CM | POA: Diagnosis not present

## 2015-04-01 DIAGNOSIS — J9621 Acute and chronic respiratory failure with hypoxia: Secondary | ICD-10-CM | POA: Diagnosis not present

## 2015-04-01 LAB — COMPREHENSIVE METABOLIC PANEL
ALT: 11 U/L — ABNORMAL LOW (ref 14–54)
ANION GAP: 5 (ref 5–15)
AST: 15 U/L (ref 15–41)
Albumin: 3.1 g/dL — ABNORMAL LOW (ref 3.5–5.0)
Alkaline Phosphatase: 54 U/L (ref 38–126)
BUN: 11 mg/dL (ref 6–20)
CHLORIDE: 107 mmol/L (ref 101–111)
CO2: 29 mmol/L (ref 22–32)
Calcium: 8.3 mg/dL — ABNORMAL LOW (ref 8.9–10.3)
Creatinine, Ser: 0.69 mg/dL (ref 0.44–1.00)
Glucose, Bld: 100 mg/dL — ABNORMAL HIGH (ref 65–99)
POTASSIUM: 4 mmol/L (ref 3.5–5.1)
Sodium: 141 mmol/L (ref 135–145)
Total Bilirubin: 0.5 mg/dL (ref 0.3–1.2)
Total Protein: 5.3 g/dL — ABNORMAL LOW (ref 6.5–8.1)

## 2015-04-01 MED ORDER — DM-GUAIFENESIN ER 30-600 MG PO TB12: 1.0000 | ORAL_TABLET | Freq: Two times a day (BID) | ORAL | Status: DC

## 2015-04-01 MED ORDER — AZITHROMYCIN 250 MG PO TABS: 250.0000 mg | ORAL_TABLET | Freq: Every day | ORAL | Status: DC

## 2015-04-01 MED ORDER — PREDNISONE 10 MG PO TABS: 10.0000 mg | ORAL_TABLET | Freq: Every day | ORAL | Status: DC

## 2015-04-01 MED ORDER — ENSURE ENLIVE PO LIQD: 237.0000 mL | Freq: Two times a day (BID) | ORAL | Status: DC

## 2015-04-01 NOTE — Discharge Summary (Signed)
Physician Discharge Summary  [name] SEG:[id number] DOB: 03-24-1956 DOA: 03/30/2015  PCP: [name] Gravel, MD  Admit date: 03/30/2015 Discharge date: 04/01/2015  Time spent: 45 minutes  Recommendations for Outpatient Follow-up:  Patient will be discharged to home.  Patient will need to follow up with primary care provider within one week of discharge and have dilantin level checked.  Recommended patient follow up with [name] or pulmonology.  Patient should continue medications as prescribed.  Patient should follow a heart healthy diet. Advised patient to stop smoking.   Discharge Diagnoses:  Acute on chronic respiratory failure with hypoxia/COPD exacerbation Tobacco and alcohol abuse Seizure disorder Protein calorie malnutrition, moderate Anxiety Headache  Discharge Condition: Stable  Diet recommendation: heart healthy  Filed Weights   03/31/15 0900  Weight: 52.9 kg (116 lb 10 oz)    History of present illness:  on 03/30/2015 by Dr. Ivor [name] [name] is a 59 y.o. female with PMH of COPD, asthma, DVT, ICH, back pain, gastric ulcer, seizure, stroke, tunnel vision, anxiety, GERD, who presents with shortness of breath and chest pain. Patient reports that she has been having worsening shortness of breath in the past 3 days. She coughs up small amount of white mucus, no fever or chills. She has right lower chest pain just above the rib cage which is aggravated by deep breath. She also has tenderness over the same area. No recent injury. No tenderness over the calf area. No recent long distant traveling. Patient does not have abdominal pain, diarrhea, symptoms of UTI, unilateral weakness. Patient's oxygen desaturated to 84% on ambulation in the emergency room. In ED, patient was found to have a CBC 4.3, troponin negative, temperature normal, electrolytes okay. Chest x-ray showed COPD. Patient is admitted to inpatient for further evaluation and treatment.  Hospital Course:  Acute  on chronic respiratory failure with hypoxia/COPD exacerbation -Patient continues to have coarse breath sounds with wheezing on exam -Chest x-ray shows COPD with no acute infiltration -Patient currently afebrile with no leukocytosis -Continue supplemental oxygen to maintain saturation above 92% -Was placed on Mucinex, Solu-Medrol, azithromycin, and neb treatments -Continue incentive spirometry -D-dimer 0.29, WNL -Will discharge patient with prednisone taper, mucinex, azithromycin -Urged patient to stop smoking and follow up with pulmonology (She has seen [name] in the past)  Tobacco and alcohol abuse -Continue nicotine patch -Discussed smoking cessation with patient -Patient stable, no signs of withdrawal  Seizure disorder -Continue Vimpat -Dilantin currently held, Dilantin level 31.2  Protein calorie malnutrition, moderate -Nutrition consulted -Continue feeding supplements  Anxiety -Continue Ativan as needed  Headache -Seems to have resolved -Continue pain control as needed  History of DVT -Patient was on coumadin however, this was discontinued by outside physician due to history of cerebral hemorrhage and risk of bleeding (per patient)  Procedures: None  Consultations: None  Discharge Exam: Filed Vitals:   04/01/15 0515  BP: 108/50  Pulse: 81  Temp: 98.6 F (37 C)  Resp: 18   Exam  General: Well developed, thin  HEENT: NCAT, mucous membranes moist.   Cardiovascular: S1 S2 auscultated, RRR, no murmurs  Respiratory: Diminished breath sounds, mild exp wheezing.   Abdomen: Soft, nontender, nondistended, + bowel sounds  Extremities: warm dry without cyanosis clubbing or edema  Neuro: AAOx3, nonfocal  Psych: Anxious  Discharge Instructions     Medication List    ASK your doctor about these medications        amoxicillin-clavulanate 500-125 MG per tablet  Commonly known as:  AUGMENTIN  Take 1 tablet (500 mg total) by mouth every 8 (eight)  hours.     ibuprofen 600 MG tablet  Commonly known as:  ADVIL,MOTRIN  Take 1 tablet (600 mg total) by mouth 2 (two) times daily as needed (back pain).     lacosamide 200 MG Tabs tablet  Commonly known as:  VIMPAT  Take 200 mg by mouth 2 (two) times daily.     LORazepam 0.5 MG tablet  Commonly known as:  ATIVAN  Take 1 tablet by mouth as needed. Panic attacks. (Max 2/day.)     phenytoin 100 MG ER capsule  Commonly known as:  DILANTIN  Take 2 capsules (200 mg total) by mouth 2 (two) times daily. Must get a Dilantin level checked by her primary care physician within a week     DILANTIN 30 MG ER capsule  Generic drug:  phenytoin  Take 2 capsules every morning. Take in addition to 100 mg for total morning dose of 160 mg.     predniSONE 20 MG tablet  Commonly known as:  DELTASONE  2 tabs po daily x 4 days     VENTOLIN HFA 108 (90 BASE) MCG/ACT inhaler  Generic drug:  albuterol  Inhale 2 puffs into the lungs 4 (four) times daily as needed for wheezing or shortness of breath.     Vitamin D (Ergocalciferol) 50000 UNITS Caps capsule  Commonly known as:  DRISDOL  Take 50,000 Units by mouth once a week. On Wednesdays       Allergies  Allergen Reactions  . Zonisamide Other (See Comments)    Numbness and tingling over whole body  . Codeine Nausea And Vomiting  . Keppra [Levetiracetam] Other (See Comments)    Causes seizures.   [name] Oil Rash      The results of significant diagnostics from this hospitalization (including imaging, microbiology, ancillary and laboratory) are listed below for reference.    Significant Diagnostic Studies: Dg Chest 2 View  03/30/2015   CLINICAL DATA:  Chest pain, shortness of breath, RIGHT side chest pain and pressure for few days, history COPD, asthma, smoking  EXAM: CHEST  2 VIEW  COMPARISON:  03/14/2015  FINDINGS: Normal heart size, mediastinal contours, and pulmonary vascularity.  LEFT nipple shadow again seen.  Emphysematous changes without  infiltrate, pleural effusion or pneumothorax.  O2 tubing projects over RIGHT upper lobe.  Bones demineralized.  IMPRESSION: COPD changes without acute infiltrate.   Electronically Signed   By: Lavonia Dana M.D.   On: 03/30/2015 20:38   Dg Chest 2 View  03/14/2015   CLINICAL DATA:  Seizure. Cough and shortness of breath. History of asthma and COPD. Fall.  EXAM: CHEST  2 VIEW  COMPARISON:  02/06/2015  FINDINGS: Hyperinflation. Thoracic spondylosis with mild osteopenia. Numerous leads and wires project over the chest. Suspect remote trauma involving posterior lateral right ribs. Midline trachea. Normal heart size and mediastinal contours. No pleural effusion or pneumothorax. Clear lungs.  IMPRESSION: Hyperinflation/COPD, without acute superimposed process.   Electronically Signed   By: Abigail Miyamoto M.D.   On: 03/14/2015 20:40    Microbiology: No results found for this or any previous visit (from the past 240 hour(s)).   Labs: Basic Metabolic Panel:  Recent Labs Lab 03/30/15 1838 04/01/15 0605  NA 141 141  K 3.5 4.0  CL 106 107  CO2 28 29  GLUCOSE 119* 100*  BUN 6 11  CREATININE 0.60 0.69  CALCIUM 9.2 8.3*   Liver Function  Tests:  Recent Labs Lab 04/01/15 0605  AST 15  ALT 11*  ALKPHOS 54  BILITOT 0.5  PROT 5.3*  ALBUMIN 3.1*   No results for input(s): LIPASE, AMYLASE in the last 168 hours. No results for input(s): AMMONIA in the last 168 hours. CBC:  Recent Labs Lab 03/30/15 1838  WBC 4.3  HGB 14.4  HCT 43.4  MCV 102.1*  PLT 137*   Cardiac Enzymes: No results for input(s): CKTOTAL, CKMB, CKMBINDEX, TROPONINI in the last 168 hours. BNP: BNP (last 3 results) No results for input(s): BNP in the last 8760 hours.  ProBNP (last 3 results) No results for input(s): PROBNP in the last 8760 hours.  CBG: No results for input(s): GLUCAP in the last 168 hours.     SignedCristal Ford  [facility] 04/01/2015, 10:17 AM

## 2015-04-01 NOTE — Discharge Instructions (Signed)
Acute Respiratory Failure °Respiratory failure is when your lungs are not working well and your breathing (respiratory) system fails. When respiratory failure occurs, it is difficult for your lungs to get enough oxygen, get rid of carbon dioxide, or both. Respiratory failure can be life threatening.  °Respiratory failure can be acute or chronic. Acute respiratory failure is sudden, severe, and requires emergency medical treatment. Chronic respiratory failure is less severe, happens over time, and requires ongoing treatment.  °WHAT ARE THE CAUSES OF ACUTE RESPIRATORY FAILURE?  °Any problem affecting the heart or lungs can cause acute respiratory failure. Some of these causes include the following: °· Chronic bronchitis and emphysema (COPD).   °· Blood clot going to a lung (pulmonary embolism).   °· Having water in the lungs caused by heart failure, lung injury, or infection (pulmonary edema).   °· Collapsed lung (pneumothorax).   °· Pneumonia.   °· Pulmonary fibrosis.   °· Obesity.   °· Asthma.   °· Heart failure.   °· Any type of trauma to the chest that can make breathing difficult.   °· Nerve or muscle diseases making chest movements difficult. °WHAT SYMPTOMS SHOULD YOU WATCH FOR?  °If you have any of these signs or symptoms, you should seek immediate medical care:  °· You have shortness of breath (dyspnea) with or without activity.   °· You have rapid, fast breathing (tachypnea).   °· You are wheezing. °· You are unable to say more than a few words without having to catch your breath. °· You find it very difficult to function normally. °· You have a fast heart rate.   °· You have a bluish color to your finger or toe nail beds.   °· You have confusion or drowsiness or both.   °HOW WILL MY ACUTE RESPIRATORY FAILURE BE TREATED?  °Treatment of acute respiratory failure depends on the cause of the respiratory failure. Usually, you will stay in the intensive care unit so your breathing can be watched closely. Treatment  can include the following: °· Oxygen. Oxygen can be delivered through the following: °· Nasal cannula. This is small tubing that goes in your nose to give you oxygen. °· Face mask. A face mask covers your nose and mouth to give you oxygen. °· Medicine. Different medicines can be given to help with breathing. These can include: °· Nebulizers. Nebulizers deliver medicines to open the air passages (bronchodilators). These medicines help to open or relax the airways in the lungs so you can breathe better. They can also help loosen mucus from your lungs. °· Diuretics. Diuretic medicines can help you breathe better by getting rid of extra water in your body. °· Steroids. Steroid medicines can help decrease swelling (inflammation) in your lungs. °· Antibiotics. °· Chest tube. If you have a collapsed lung (pneumothorax), a chest tube is placed to help reinflate the lung. °· Non-invasive positive pressure ventilation (NPPV). This is a tight-fitting mask that goes over your nose and mouth. The mask has tubing that is attached to a machine. The machine blows air into the tubing, which helps to keep the tiny air sacs (alveoli) in your lungs open. This machine allows you to breathe on your own. °· Ventilator. A ventilator is a breathing machine. When on a ventilator, a breathing tube is put into the lungs. A ventilator is used when you can no longer breathe well enough on your own. You may have low oxygen levels or high carbon dioxide (CO2) levels in your blood. When you are on a ventilator, sedation and pain medicines are given to make you sleep   so your lungs can heal. Document Released: 07/26/2013 Document Revised: 12/05/2013 Document Reviewed: 07/26/2013 [facility] Patient Information 2015 [place], [place]. This information is not intended to replace advice given to you by your health care provider. Make sure you discuss any questions you have with your health care provider. Smoking Cessation Quitting smoking is important to  your health and has many advantages. However, it is not always easy to quit since nicotine is a very addictive drug. Oftentimes, people try 3 times or more before being able to quit. This document explains the best ways for you to prepare to quit smoking. Quitting takes hard work and a lot of effort, but you can do it. ADVANTAGES OF QUITTING SMOKING  You will live longer, feel better, and live better.  Your body will feel the impact of quitting smoking almost immediately.  Within 20 minutes, blood pressure decreases. Your pulse returns to its normal level.  After 8 hours, carbon monoxide levels in the blood return to normal. Your oxygen level increases.  After 24 hours, the chance of having a heart attack starts to decrease. Your breath, hair, and body stop smelling like smoke.  After 48 hours, damaged nerve endings begin to recover. Your sense of taste and smell improve.  After 72 hours, the body is virtually free of nicotine. Your bronchial tubes relax and breathing becomes easier.  After 2 to 12 weeks, lungs can hold more air. Exercise becomes easier and circulation improves.  The risk of having a heart attack, stroke, cancer, or lung disease is greatly reduced.  After 1 year, the risk of coronary heart disease is cut in half.  After 5 years, the risk of stroke falls to the same as a nonsmoker.  After 10 years, the risk of lung cancer is cut in half and the risk of other cancers decreases significantly.  After 15 years, the risk of coronary heart disease drops, usually to the level of a nonsmoker.  If you are pregnant, quitting smoking will improve your chances of having a healthy baby.  The people you live with, especially any children, will be healthier.  You will have extra money to spend on things other than cigarettes. QUESTIONS TO THINK ABOUT BEFORE ATTEMPTING TO QUIT You may want to talk about your answers with your health care provider.  Why do you want to quit?  If  you tried to quit in the past, what helped and what did not?  What will be the most difficult situations for you after you quit? How will you plan to handle them?  Who can help you through the tough times? Your family? Friends? A health care provider?  What pleasures do you get from smoking? What ways can you still get pleasure if you quit? Here are some questions to ask your health care provider:  How can you help me to be successful at quitting?  What medicine do you think would be best for me and how should I take it?  What should I do if I need more help?  What is smoking withdrawal like? How can I get information on withdrawal? GET READY  Set a quit date.  Change your environment by getting rid of all cigarettes, ashtrays, matches, and lighters in your home, car, or work. Do not let people smoke in your home.  Review your past attempts to quit. Think about what worked and what did not. GET SUPPORT AND ENCOURAGEMENT You have a better chance of being successful if you have help.  You can get support in many ways.  Tell your family, friends, and coworkers that you are going to quit and need their support. Ask them not to smoke around you.  Get individual, group, or telephone counseling and support. Programs are available at [employer] and health centers. Call your local health department for information about programs in your area.  Spiritual beliefs and practices may help some smokers quit.  Download a "quit meter" on your computer to keep track of quit statistics, such as how long you have gone without smoking, cigarettes not smoked, and money saved.  Get a self-help book about quitting smoking and staying off tobacco. [place] yourself from urges to smoke. Talk to someone, go for a walk, or occupy your time with a task.  Change your normal routine. Take a different route to work. Drink tea instead of coffee. Eat breakfast in a different  place.  Reduce your stress. Take a hot bath, exercise, or read a book.  Plan something enjoyable to do every day. Reward yourself for not smoking.  Explore interactive web-based programs that specialize in helping you quit. GET MEDICINE AND USE IT CORRECTLY Medicines can help you stop smoking and decrease the urge to smoke. Combining medicine with the above behavioral methods and support can greatly increase your chances of successfully quitting smoking.  Nicotine replacement therapy helps deliver nicotine to your body without the negative effects and risks of smoking. Nicotine replacement therapy includes nicotine gum, lozenges, inhalers, nasal sprays, and skin patches. Some may be available over-the-counter and others require a prescription.  Antidepressant medicine helps people abstain from smoking, but how this works is unknown. This medicine is available by prescription.  Nicotinic receptor partial agonist medicine simulates the effect of nicotine in your brain. This medicine is available by prescription. Ask your health care provider for advice about which medicines to use and how to use them based on your health history. Your health care provider will tell you what side effects to look out for if you choose to be on a medicine or therapy. Carefully read the information on the package. Do not use any other product containing nicotine while using a nicotine replacement product.  RELAPSE OR DIFFICULT SITUATIONS Most relapses occur within the first 3 months after quitting. Do not be discouraged if you start smoking again. Remember, most people try several times before finally quitting. You may have symptoms of withdrawal because your body is used to nicotine. You may crave cigarettes, be irritable, feel very hungry, cough often, get headaches, or have difficulty concentrating. The withdrawal symptoms are only temporary. They are strongest when you first quit, but they will go away within 10-14  days. To reduce the chances of relapse, try to:  Avoid drinking alcohol. Drinking lowers your chances of successfully quitting.  Reduce the amount of caffeine you consume. Once you quit smoking, the amount of caffeine in your body increases and can give you symptoms, such as a rapid heartbeat, sweating, and anxiety.  Avoid smokers because they can make you want to smoke.  Do not let weight gain distract you. Many smokers will gain weight when they quit, usually less than 10 pounds. Eat a healthy diet and stay active. You can always lose the weight gained after you quit.  Find ways to improve your mood other than smoking. FOR MORE INFORMATION  www.smokefree.gov  Document Released: 07/15/2001 Document Revised: 12/05/2013 Document Reviewed: 10/30/2011 [facility] Patient Information 2015 [place], [place]. This  information is not intended to replace advice given to you by your health care provider. Make sure you discuss any questions you have with your health care provider. Chronic Obstructive Pulmonary Disease Chronic obstructive pulmonary disease (COPD) is a common lung condition in which airflow from the lungs is limited. COPD is a general term that can be used to describe many different lung problems that limit airflow, including both chronic bronchitis and emphysema. If you have COPD, your lung function will probably never return to normal, but there are measures you can take to improve lung function and make yourself feel better.  CAUSES   Smoking (common).   Exposure to secondhand smoke.   Genetic problems.  Chronic inflammatory lung diseases or recurrent infections. SYMPTOMS   Shortness of breath, especially with physical activity.   Deep, persistent (chronic) cough with a large amount of thick mucus.   Wheezing.   Rapid breaths (tachypnea).   Gray or bluish discoloration (cyanosis) of the skin, especially in fingers, toes, or lips.   Fatigue.   Weight loss.    Frequent infections or episodes when breathing symptoms become much worse (exacerbations).   Chest tightness. DIAGNOSIS  Your health care provider will take a medical history and perform a physical examination to make the initial diagnosis. Additional tests for COPD may include:   Lung (pulmonary) function tests.  Chest X-ray.  CT scan.  Blood tests. TREATMENT  Treatment available to help you feel better when you have COPD includes:   Inhaler and nebulizer medicines. These help manage the symptoms of COPD and make your breathing more comfortable.  Supplemental oxygen. Supplemental oxygen is only helpful if you have a low oxygen level in your blood.   Exercise and physical activity. These are beneficial for nearly all people with COPD. Some people may also benefit from a pulmonary rehabilitation program. HOME CARE INSTRUCTIONS   Take all medicines (inhaled or pills) as directed by your health care provider.  Avoid over-the-counter medicines or cough syrups that dry up your airway (such as antihistamines) and slow down the elimination of secretions unless instructed otherwise by your health care provider.   If you are a smoker, the most important thing that you can do is stop smoking. Continuing to smoke will cause further lung damage and breathing trouble. Ask your health care provider for help with quitting smoking. He or she can direct you to community resources or hospitals that provide support.  Avoid exposure to irritants such as smoke, chemicals, and fumes that aggravate your breathing.  Use oxygen therapy and pulmonary rehabilitation if directed by your health care provider. If you require home oxygen therapy, ask your health care provider whether you should purchase a pulse oximeter to measure your oxygen level at home.   Avoid contact with individuals who have a contagious illness.  Avoid extreme temperature and humidity changes.  Eat healthy foods. Eating  smaller, more frequent meals and resting before meals may help you maintain your strength.  Stay active, but balance activity with periods of rest. Exercise and physical activity will help you maintain your ability to do things you want to do.  Preventing infection and hospitalization is very important when you have COPD. Make sure to receive all the vaccines your health care provider recommends, especially the pneumococcal and influenza vaccines. Ask your health care provider whether you need a pneumonia vaccine.  Learn and use relaxation techniques to manage stress.  Learn and use controlled breathing techniques as directed by your health care  provider. Controlled breathing techniques include:   Pursed lip breathing. Start by breathing in (inhaling) through your nose for 1 second. Then, purse your lips as if you were going to whistle and breathe out (exhale) through the pursed lips for 2 seconds.   Diaphragmatic breathing. Start by putting one hand on your abdomen just above your waist. Inhale slowly through your nose. The hand on your abdomen should move out. Then purse your lips and exhale slowly. You should be able to feel the hand on your abdomen moving in as you exhale.   Learn and use controlled coughing to clear mucus from your lungs. Controlled coughing is a series of short, progressive coughs. The steps of controlled coughing are:   Lean your head slightly forward.   Breathe in deeply using diaphragmatic breathing.   Try to hold your breath for 3 seconds.   Keep your mouth slightly open while coughing twice.   Spit any mucus out into a tissue.   Rest and repeat the steps once or twice as needed. SEEK MEDICAL CARE IF:   You are coughing up more mucus than usual.   There is a change in the color or thickness of your mucus.   Your breathing is more labored than usual.   Your breathing is faster than usual.  SEEK IMMEDIATE MEDICAL CARE IF:   You have  shortness of breath while you are resting.   You have shortness of breath that prevents you from:  Being able to talk.   Performing your usual physical activities.   You have chest pain lasting longer than 5 minutes.   Your skin color is more cyanotic than usual.  You measure low oxygen saturations for longer than 5 minutes with a pulse oximeter. MAKE SURE YOU:   Understand these instructions.  Will watch your condition.  Will get help right away if you are not doing well or get worse. Document Released: 04/30/2005 Document Revised: 12/05/2013 Document Reviewed: 03/17/2013 [facility] Patient Information 2015 [place], [place]. This information is not intended to replace advice given to you by your health care provider. Make sure you discuss any questions you have with your health care provider.

## 2015-04-01 NOTE — Progress Notes (Signed)
04/01/15 Patient was able to ambulate without 02 and remain at 93%. She will be discharged today home, IV site removed, and discharge instructions reviewed with patient.

## 2015-04-02 LAB — LEGIONELLA ANTIGEN, URINE

## 2015-04-05 LAB — CULTURE, BLOOD (ROUTINE X 2)
CULTURE: NO GROWTH
Culture: NO GROWTH

## 2015-06-07 IMAGING — CT CT HEAD W/O CM
1 series · 16 of 30 positions shown, 20 images · non-contrast
Comparison: 01/22/2013

CLINICAL DATA: Seizure, nausea

EXAM:
CT HEAD WITHOUT CONTRAST
TECHNIQUE: Contiguous axial images were obtained from the base of the skull
through the vertex without intravenous contrast.

[Series 2: head 5.0 h30s · axial · 0.46mm/px · z∈[-136,+9]mm · 16 of 33 slices shown, 20 images]
[im 2/33  brain]
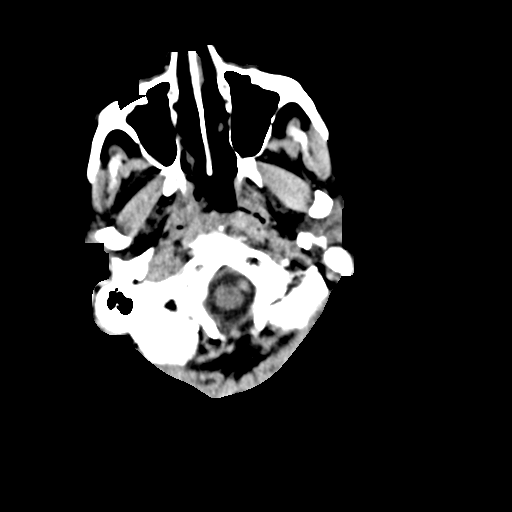
[im 2/33  bone]
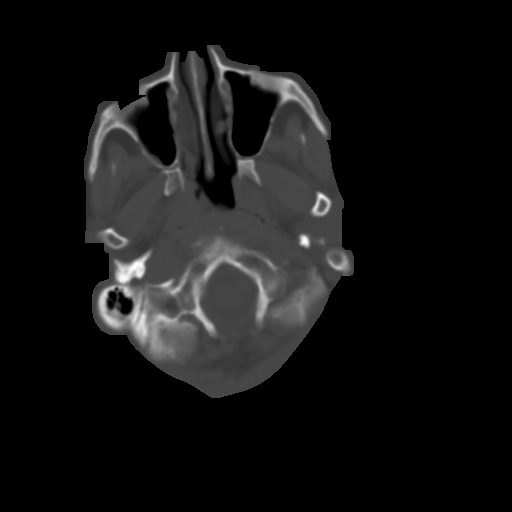
[im 4/33  brain]
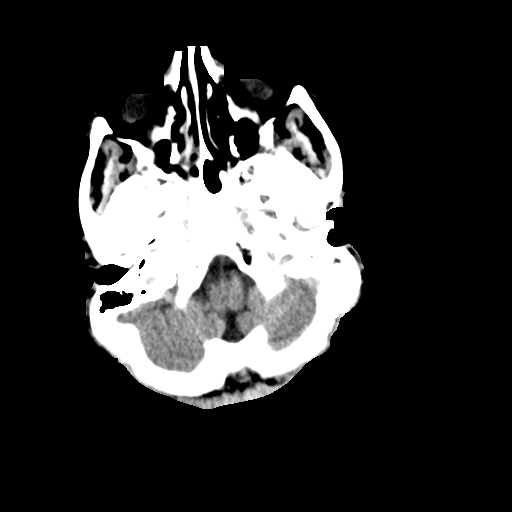
[im 6/33  brain]
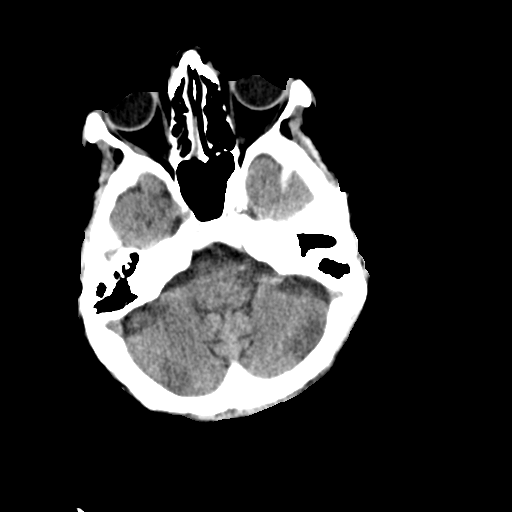
[im 8/33  brain]
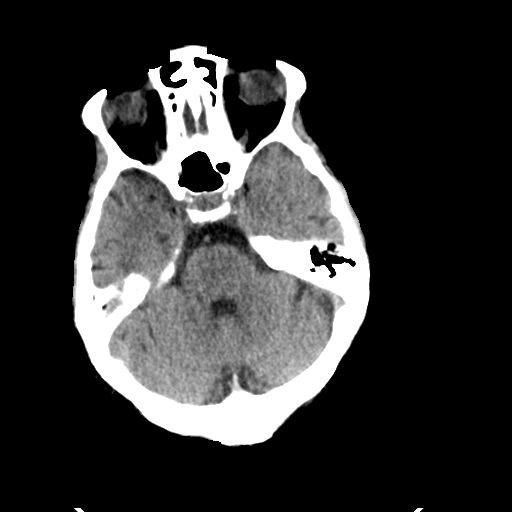
[im 9/33  brain]
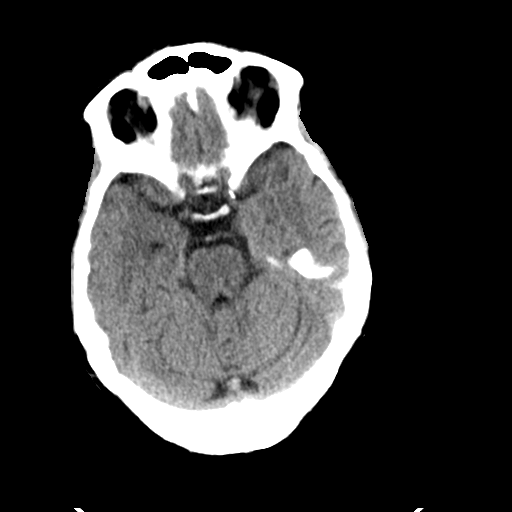
[im 9/33  bone]
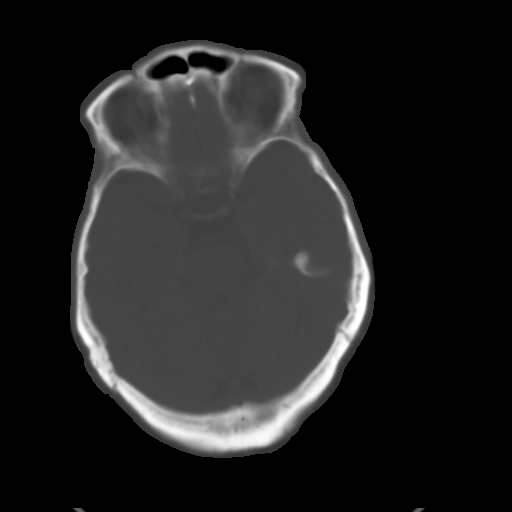
[im 12/33  brain]
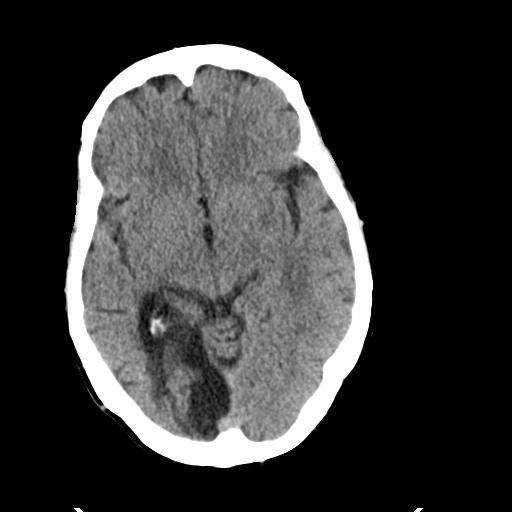
[im 14/33  brain]
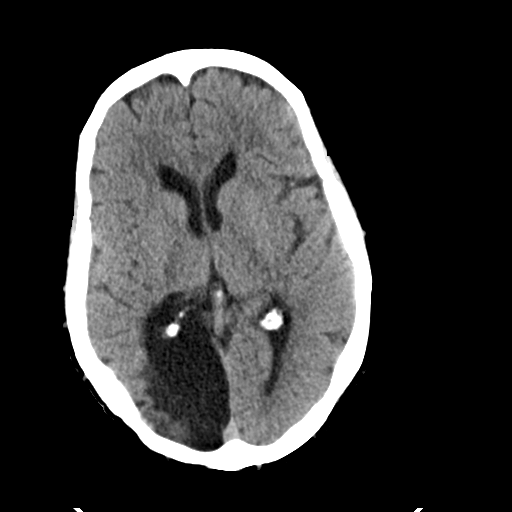
[im 16/33  brain]
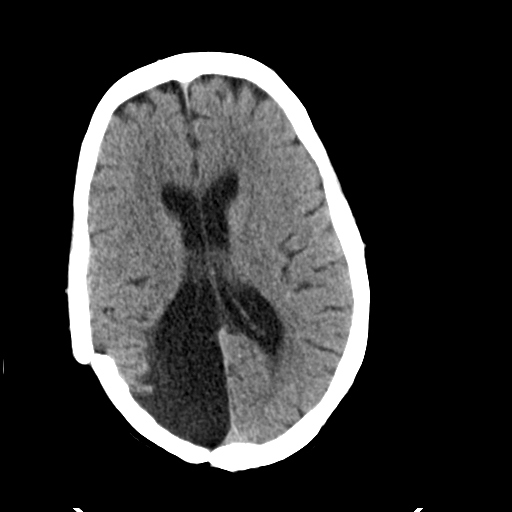
[im 17/33  brain]
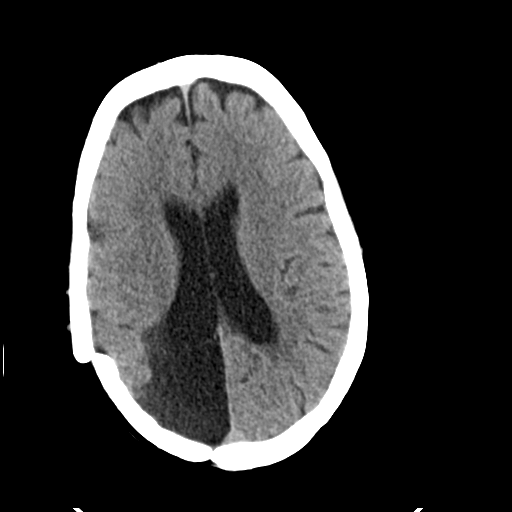
[im 17/33  bone]
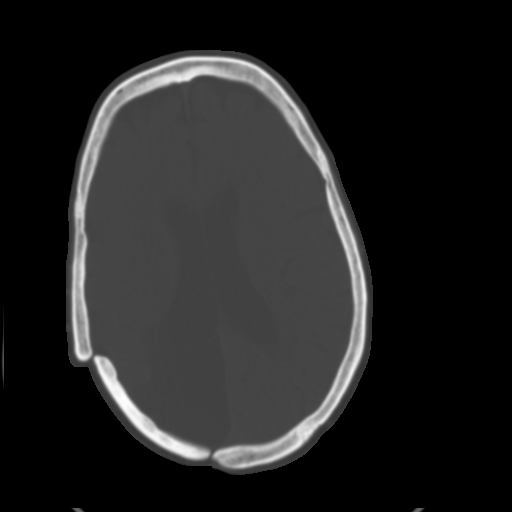
[im 19/33  brain]
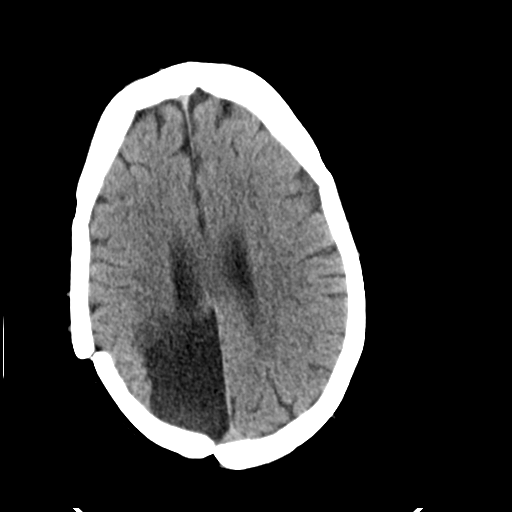
[im 21/33  brain]
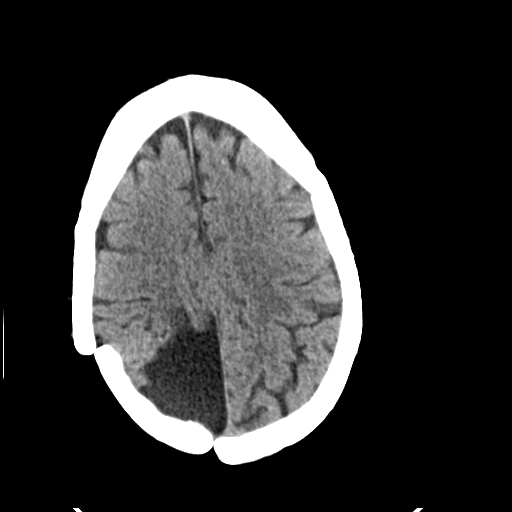
[im 24/33  brain]
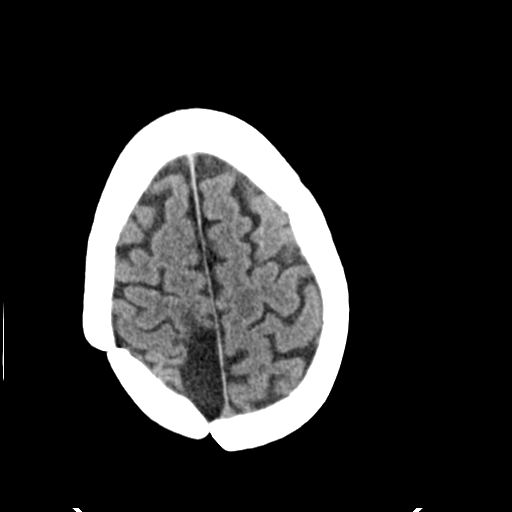
[im 25/33  brain]
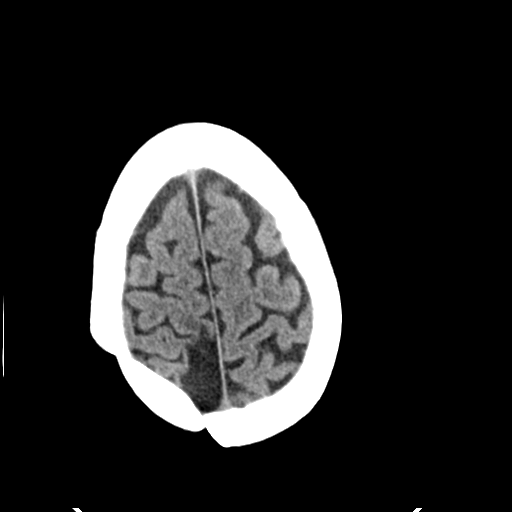
[im 25/33  bone]
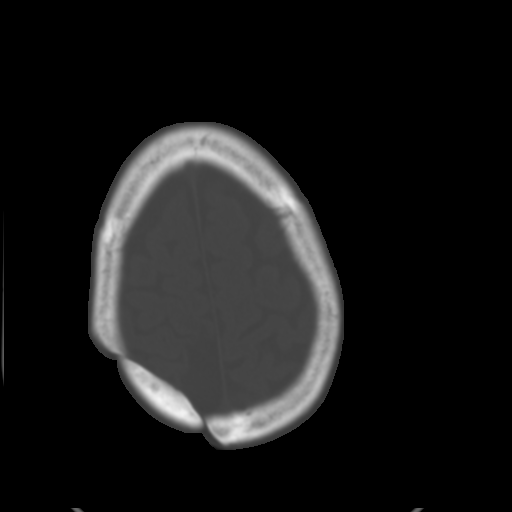
[im 27/33  brain]
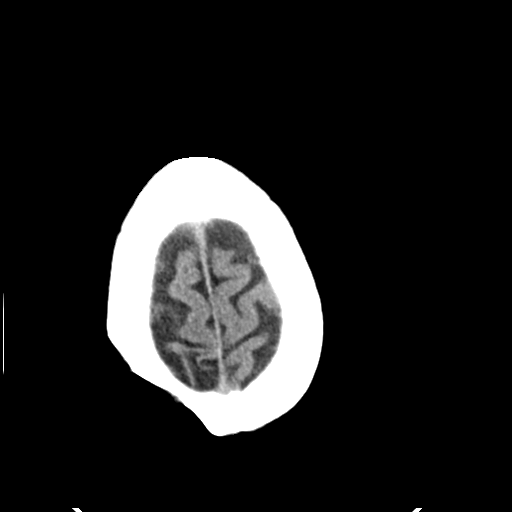
[im 29/33  brain]
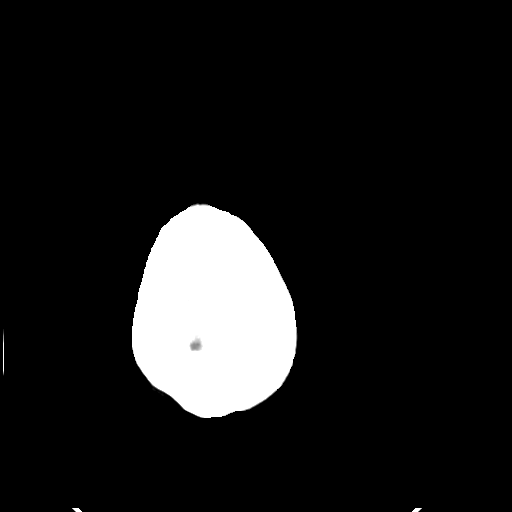
[im 31/33  brain]
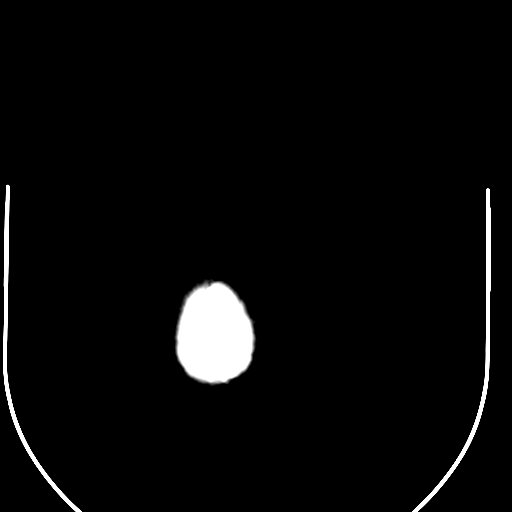

[16 of 30 positions shown; findings below may reference images not displayed]

FINDINGS: Large area of encephalomalacia at the right occipital lobe and
posterior parietal lobe secondary to prior surgery.

Generalized atrophy.

Stable ventricular morphology with dilatation of the occipital horn
of the right lateral ventricle.

No midline shift or mass effect.

Remaining brain parenchyma normal in appearance.

No intracranial hemorrhage, mass lesion or evidence acute
infarction.

No extra-axial fluid collections otherwise seen.

Stable post craniotomy changes posterior right parietal region.

No acute osseous findings.
IMPRESSION: Large area of stable encephalomalacia at the right parieto-occipital
region.

No new intracranial abnormalities.

## 2015-06-15 NOTE — Patient Outreach (Signed)
[facility]) Care Management  06/15/2015  [name] 09-08-1955 [id number]   Referral from [facility] tier 4 list, assigned [name], RN for patient outreach.  [name] L. Warnell Rasnic, [place] Care Management Assistant

## 2015-06-18 ENCOUNTER — Other Ambulatory Visit: Payer: Self-pay

## 2015-06-18 DIAGNOSIS — R569 Unspecified convulsions: Secondary | ICD-10-CM

## 2015-06-18 NOTE — Patient Outreach (Addendum)
THN contact for assessment of need for community care coordination. Patient identified herself by providing date of birth, however, her new address, per patient is [address]  Patient states she has had to cancel many appointments of late due to lack of transportation.   This RNCM and patient collaborated to formulate her community case management to focus on transportation and safety.  LCSW referral for arrangement of transportation, patient has appointment with her primary care physician on Thursday, November 17.  Will need transportation to appointment. Patient has moved in with her daughter on [address]

## 2015-06-19 ENCOUNTER — Other Ambulatory Visit: Payer: Self-pay | Admitting: Licensed Clinical Social Worker

## 2015-06-19 NOTE — Patient Outreach (Signed)
  [facility]) Care Management  06/19/2015  [name] 07/14/1956 [id number]   Assessment- CSW received referral from [facility] as patient is in need of stable transportation for PCP appointment on 06/21/15. CSW contacted patient, patient answered and provided HIPPA verifications. CSW introduced self, reason for referral and of [name] services. Patient agreeable to social work assistance. CSW questioned if patient uses [name] and patient declined. Patient unaware that she has transportation benefits. CSW contacted [name] while on the line with patient and arranged transportation successfully to PCP appointment on 06/21/15. [name] will pick patient up at residence at 2:00 pm and will pick up patient from physicians office at 3:45 (will wait 20 minutes later if needed. CSW passed this information along to patient. Patient expresses understanding. CSW provided patient with number to schedule [name] rides in the future, [phone] and number if she needs to contact [name] if anything were to occur (ride late, need to cancel schedule ride or need to change pick up time), [phone]. Patient was informed that she has 5 round trips rides until December 31st, 2016 then she will have 6 round trips again. Patient reports that she sometimes has transportation with daughter, nephew's wife and sister but that she does not like to depend on them as they work full time and she usually cancels medical appointments because it becomes stressful to arrange transportation. CSW reviewed [name] program with patient and patient is agreeable to referral. CSW will make referral on 06/20/15 as office is closed for [name]. CSW explained [name] services as well and patient is agreeable to complete application during a joint home visit with RNCM [name] on 06/26/15 at 10 am. Patient  interested in completing advance directives during that time as well. Patient denies any feelings or symptoms of depression. Patient denies any alcohol or substance use. Patient confirms that she has "many falls because I have seizures" but denies any injuries.   Plan-CSW will complete home visit with patient and RNCM on 06/26/15.  [name], BSW, MSW, [email or web address] Phone: [phone] Fax: [phone]

## 2015-06-20 ENCOUNTER — Ambulatory Visit: Payer: Self-pay

## 2015-06-21 ENCOUNTER — Other Ambulatory Visit: Payer: Self-pay | Admitting: Licensed Clinical Social Worker

## 2015-06-21 DIAGNOSIS — R05 Cough: Secondary | ICD-10-CM | POA: Diagnosis not present

## 2015-06-21 DIAGNOSIS — R0600 Dyspnea, unspecified: Secondary | ICD-10-CM | POA: Diagnosis not present

## 2015-06-21 DIAGNOSIS — R6889 Other general symptoms and signs: Secondary | ICD-10-CM | POA: Diagnosis not present

## 2015-06-21 NOTE — Patient Outreach (Signed)
[facility]) Care Management  06/21/2015  [name] [birth year] [id number]   Assessment-CSW successfully completed referral to [name] on 06/21/15.  Plan-CSW assist patient in completing application during home visit next week.  [name], BSW, MSW, [email or web address] Phone: [phone] Fax: [phone]

## 2015-06-26 ENCOUNTER — Other Ambulatory Visit: Payer: Self-pay

## 2015-06-26 ENCOUNTER — Other Ambulatory Visit: Payer: Self-pay | Admitting: Licensed Clinical Social Worker

## 2015-06-26 NOTE — Patient Outreach (Signed)
[facility] [facility]) Care Management  06/26/2015  [name] 1956/01/23 [id number]   Joint visit with LCSW, [name] from [facility] to assess need for community care coordination needs.  Patient has many stressor including recent move, death of daughter 5 years ago (daughter was murdered).    Has missed many medical appointments due to lack of transportation.  Patient signed [name] application for transportation consideration.    Home visit in 2 weeks for COPD education to include zones.  Patient given [facility] Calendar which has  List of zones. Next visit will focus on Fall/Safety, COPD pathophysiology and management of COPD.    Patient's community coordination care plan reviewed and updated.

## 2015-06-26 NOTE — Patient Outreach (Signed)
[facility]) Care Management  [facility] Social Work  06/26/2015  [name] 1956-03-03 [id number]   Current Medications:  Current Outpatient Prescriptions  Medication Sig Dispense Refill  . azithromycin (ZITHROMAX) 250 MG tablet Take 1 tablet (250 mg total) by mouth daily. 3 tablet 0  . dextromethorphan-guaiFENesin (MUCINEX DM) 30-600 MG per 12 hr tablet Take 1 tablet by mouth 2 (two) times daily. 14 tablet 0  . feeding supplement, ENSURE ENLIVE, (ENSURE ENLIVE) LIQD Take 237 mLs by mouth 2 (two) times daily between meals. 60 Bottle 0  . ibuprofen (ADVIL,MOTRIN) 600 MG tablet Take 1 tablet (600 mg total) by mouth 2 (two) times daily as needed (back pain). 15 tablet 1  . lacosamide (VIMPAT) 200 MG TABS tablet Take 200 mg by mouth 2 (two) times daily.    Marland Kitchen LORazepam (ATIVAN) 0.5 MG tablet Take 1 tablet by mouth as needed. Panic attacks. (Max 2/day.)  1  . phenytoin (DILANTIN) 100 MG ER capsule Take 2 capsules (200 mg total) by mouth 2 (two) times daily. Must get a Dilantin level checked by her primary care physician within a week (Patient taking differently: Take 100-200 mg by mouth 2 (two) times daily. Take 100 mg every morning and 200 mg at bedtime. Must get a Dilantin level checked by her primary care physician within a week) 60 capsule 0  . predniSONE (DELTASONE) 10 MG tablet Take 1 tablet (10 mg total) by mouth daily with breakfast. Take  Prednisone 40mg  (4 tabs) x 3 days, then taper to 30mg  (3 tabs) x 3 days, then 20mg  (2 tabs) x 3days, then 10mg  (1 tab) x 3days, then OFF. 30 tablet 0  . VENTOLIN HFA 108 (90 BASE) MCG/ACT inhaler Inhale 2 puffs into the lungs 4 (four) times daily as needed for wheezing or shortness of breath.   0  . Vitamin D, Ergocalciferol, (DRISDOL) 50000 UNITS CAPS capsule Take 50,000 Units by mouth once a week. On Wednesdays     No current facility-administered medications for this visit.    Functional Status:  In your present state of health, do  you have any difficulty performing the following activities: 06/26/2015 03/31/2015  Hearing? N N  Vision? Y N  Difficulty concentrating or making decisions? N N  Walking or climbing stairs? Y Y  Dressing or bathing? Y N  Doing errands, shopping? Tempie Donning  Preparing Food and eating ? N -  Using the Toilet? N -  In the past six months, have you accidently leaked urine? N -  Do you have problems with loss of bowel control? N -  Managing your Medications? N -  Managing your Finances? N -  Housekeeping or managing your Housekeeping? N -    Fall/Depression Screening:  PHQ 2/9 Scores 06/19/2015  PHQ - 2 Score 0    Assessment: CSW completed joint visit today with RNCM [name] at patient's residence. Patient reports that she has seizure disorder and can have up to three seizures per week.  She shares that she often has falls while having seizures. Patient reports not having stable transportation and is interested in completing [name] application. CSW completed [name] application with patient. Patient is unable to travel regular bus as she has poor mobility, poor vision, seizures, COPD and intracranial hemorrhage history. Patient will use [name] to her medical appointments until she is eligible for [name] services. Patient shares that she has dealt with a lot of trauma within her life. Patient reports that she benefits from  the support of her family. Patient is very involved in her grandchildren's lives and her children help assist patient with needs. Patient shares an interest in applying for Medicaid. CSW provided patient with Medicaid application for her to review as well as a list of medicaid application process assistance forms. CSW reviewed documents with her. Patient will continue to review documents and will make decision on whether she wishes to apply for Medicaid. CSW will fax [name] application on [id number]. Patient is aware that [name] will follow up with her and schedule an assessment  appointment. Patient also aware that [name] will provide transportation to and from the assessment appointment.  Patient also expressed interest in gaining dental resources. CSW sent message to care management assistant Damita Rhodie to send dental resource list to patient via mail. CSW reviewed with patient what document will look like and the various agencies that can provide dental assistance including [employer], [employer], [facility], [facility] and [facility]. Patient agreeable to stable transportation goal.   Plan-CSW will contact patient in two weeks to follow up with community resources.  [name], BSW, MSW, [email or web address] Phone: [phone] Fax: [phone]

## 2015-06-27 NOTE — Patient Outreach (Signed)
[place]) Care Management  06/27/2015  [name] [birth year] [id number]   Request received from [name], LCSW for information on Medicaid and a list of dental resources. Packet sent 06/27/15.  Thanks, Josepha Pigg, [place] Management [facility] CM Assistant  Starleen Arms, [place] Management Assistant

## 2015-06-27 NOTE — Patient Outreach (Signed)
[facility]) Care Management  June 26, 2015  [name] February 21, 1956 [id number]    Completed this scheduled  initial home visit with [name], LCSW/THN. patient was receptive to visit and readily responded to community care coordination. Patient reports having several losses over the last 20 years including a daughter who was murdered. Patient also had to move recently (she was living in the home with her mother) because her mother's grandson and his wife moved in.  Patient reports her mother's grandson has substance and an extensive criminal background.  Patient states she is very content where she is now.  Patient also has history of seizures, is being treated at [facility] in [place], [place] but has had to miss appointments due to lack of transportation.  Patient has missed appointments with her primary care physician due to lack of transportation. Patient has been connected with [facility] transportation.  [name] assisted patient in filing out a [name] application during this visit.   Patient's case management care plan updated during this visit.  Patient and this RNCM agreed to focus on fall/prevention and safety due to seizure history.   Plan: Home visit December 2016 for fall/safety education, Assess medication and appointment compliance during this meeting also. Will also assess for additional care management needs.

## 2015-07-05 ENCOUNTER — Other Ambulatory Visit: Payer: Self-pay | Admitting: Licensed Clinical Social Worker

## 2015-07-05 NOTE — Patient Outreach (Signed)
[facility]) Care Management  07/05/2015  [name] November 06, 1955 [id number]   Assessment-CSW completed outreach to patient on 07/05/15. Patient answered and provided HIPPA verifications. Patient reports that she has had an ear ache for the past 3 days but that it is starting to feel better. CSW questioned if she has had any seizures in the past few weeks and she reports that she had one on Saturday. Patient shares that she was in her bed so she does not think she had a fall. Patient reports that her daughter arrived shortly after she had a seizure. Patient has an appointment at [facility] on 10/11/14 and that her daughter has already asked off work in order to transport her. CSW questioned if she has heard back from [name]. Patient confirms that she received a letter in the mail from [facility] her of her assessment appointment on 08/12/14 at 10:30. Patient is aware that [name] will be providing transportation to and from this appointment. CSW questioned if patient would be interested in having a volunteer contact her to spread cheer and peace during the [place] on Wednesdays as holidays can be difficult for those who have lost loved ones. Patient reports "My mental health is fine. I take good care of myself but it is nice to talk to people." Patient agreeable to this recommended service. Patient confirms that she received dental assistance resources and will start outreaches this week. CSW reviewed resources over the phone with patient.  Plan-CSW will complete outreach in two weeks. CSW will remain available to patient for all social work needs.  [name], BSW, MSW, [email or web address] Phone: [phone] Fax: [phone]    3/

## 2015-07-10 ENCOUNTER — Other Ambulatory Visit: Payer: Self-pay

## 2015-07-11 NOTE — Patient Outreach (Signed)
[facility]) Care Management  July 10, 2015   [name] 11-04-1955 [id number]    Home visit completed with patient who was emotionally upset about her mother's current health.  Patient reports her mother is currently in [facility] ICU after experiencing a heart attack.  Patient states her mother had a lot of fluid in her body because she had skipped hemodialysis. Patient reports paints her mother has had dialysis now, is still very ill.   Emotional support offered to patient as she verbalized her concerns regarding her mother's situation.  Patient reports her mother has a grandson and his wife living with her and both are substance abusers.    Patient and this RNCM viewed EMMI Video on COPD and Seizures. Patient stated she felt she learned a lot from the video and would use the information regarding COPD especially.    Plan: Telephone cal with patient on July 27, 2015 to assess need for additional community care coordination

## 2015-07-16 IMAGING — MR MR LUMBAR SPINE W/O CM
5 series · 48 of 48 positions shown · non-contrast
Comparison: Radiographs 07/23/2011. CT 05/20/2010.

CLINICAL DATA: Bilateral foot numbness for 3 days. History of
seizures.

EXAM:
MRI LUMBAR SPINE WITHOUT CONTRAST
TECHNIQUE: Multiplanar, multisequence MR imaging was performed. No intravenous
contrast was administered.

[Series 3: T2 · sagittal · 4.0mm · 0.55mm/px · 5 of 14 slices shown (1 of 2)]
[im 1/14]
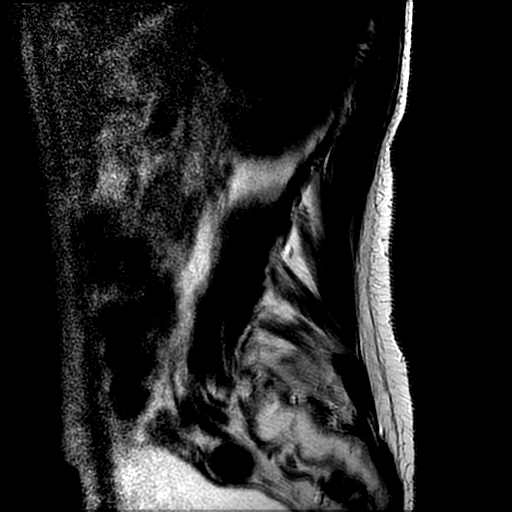
[im 4/14]
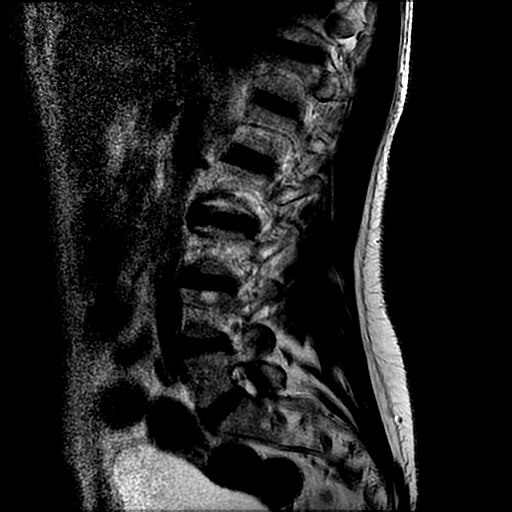
[im 7/14]
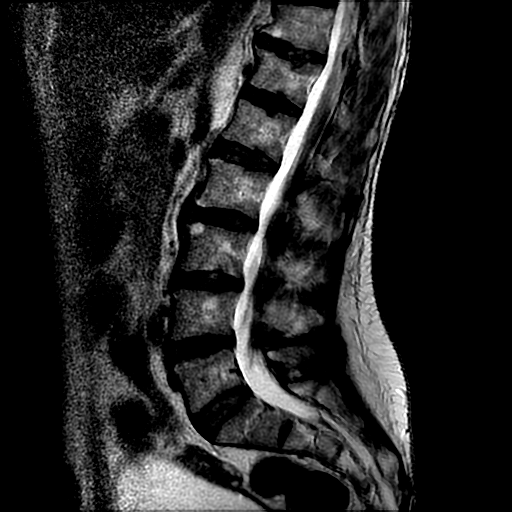
[im 10/14]
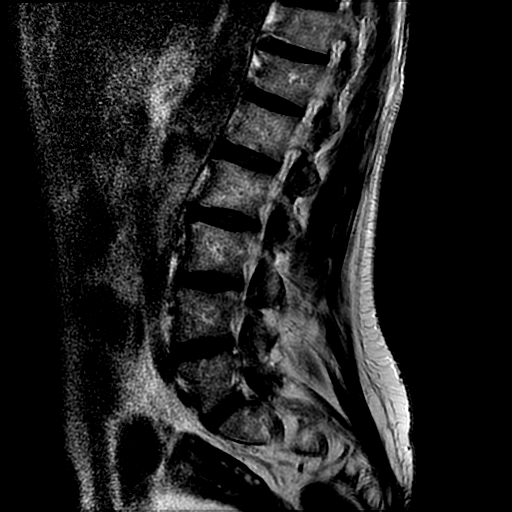
[im 14/14]
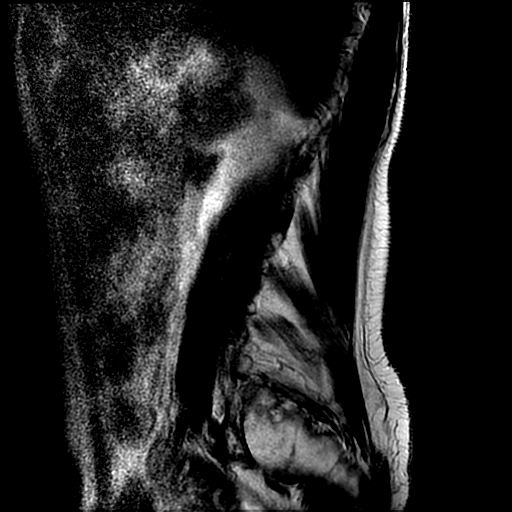

[Series 5: sag ir · sagittal · 4.0mm · 1.09mm/px · 5 of 14 slices shown]
[im 1/14]
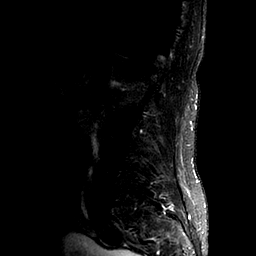
[im 4/14]
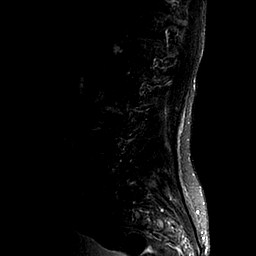
[im 7/14]
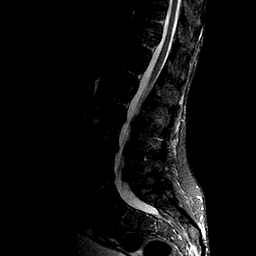
[im 10/14]
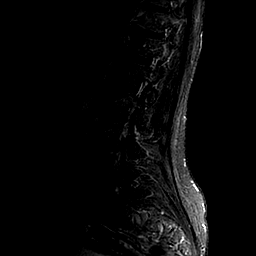
[im 14/14]
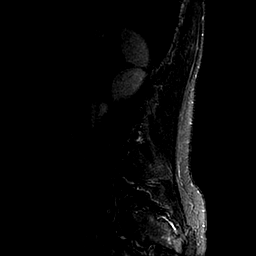

[Series 6: T1 · sagittal · 4.0mm · 1.09mm/px · 6 of 14 slices shown (1 of 2)]
[im 1/14]
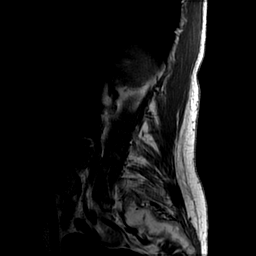
[im 3/14]
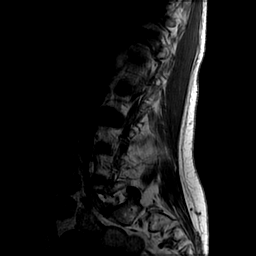
[im 6/14]
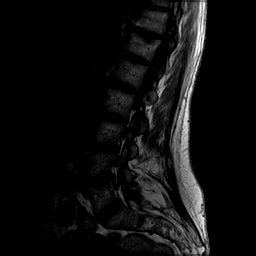
[im 8/14]
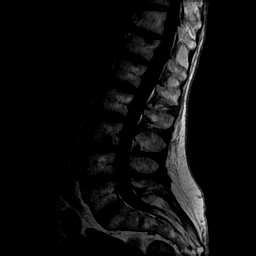
[im 11/14]
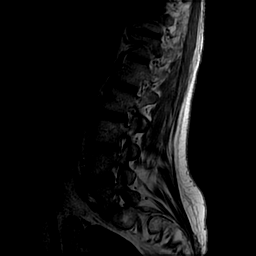
[im 14/14]
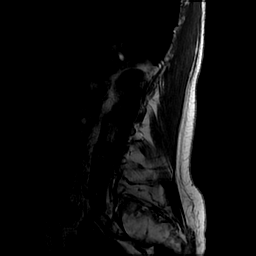

[Series 7: T2 · axial · 4.0mm · 0.78mm/px · z∈[-88,+134]mm · 16 of 40 slices shown (2 of 2)]
[im 1/40]
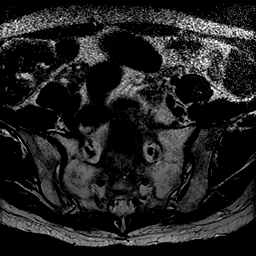
[im 3/40]
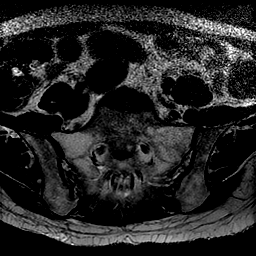
[im 6/40]
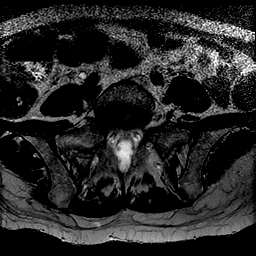
[im 8/40]
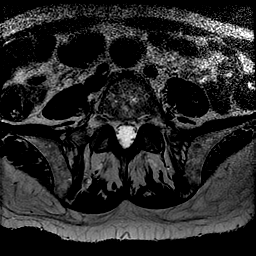
[im 11/40]
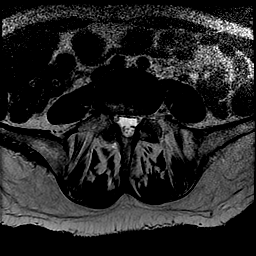
[im 14/40]
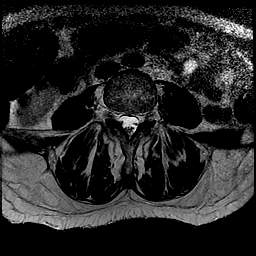
[im 16/40]
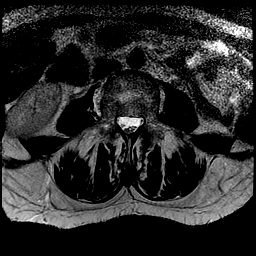
[im 19/40]
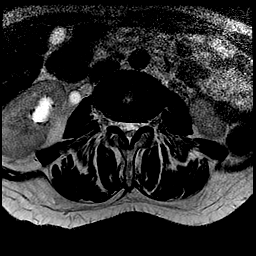
[im 21/40]
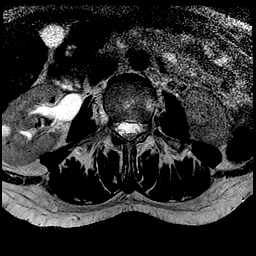
[im 24/40]
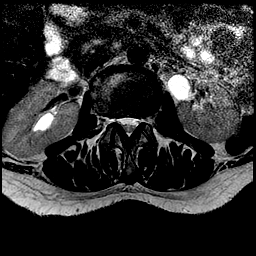
[im 27/40]
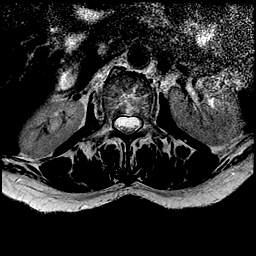
[im 29/40]
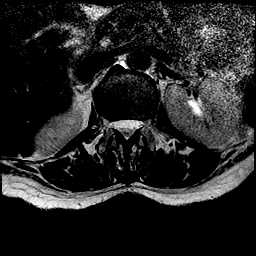
[im 32/40]
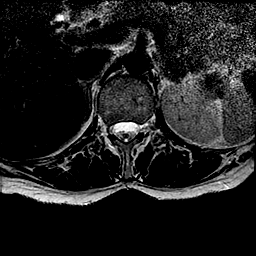
[im 34/40]
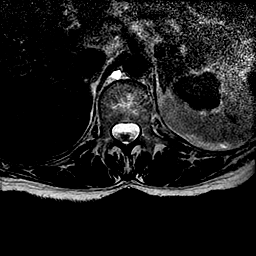
[im 37/40]
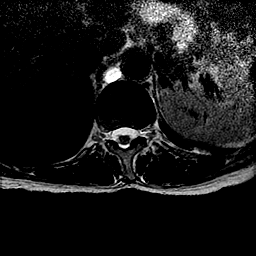
[im 40/40]
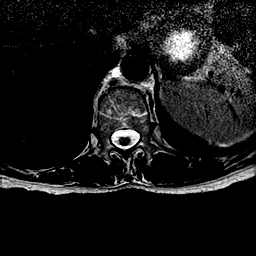

[Series 9: T1 · axial · 4.0mm · 0.78mm/px · z∈[-88,+134]mm · 16 of 40 slices shown (2 of 2)]
[im 1/40]
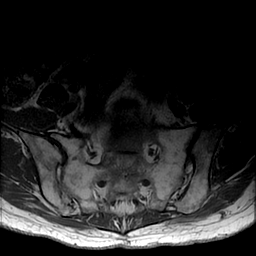
[im 3/40]
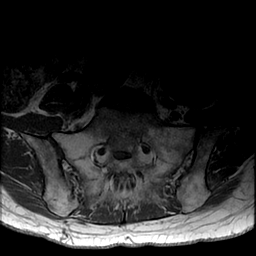
[im 6/40]
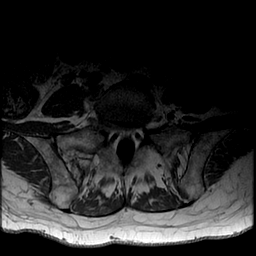
[im 8/40]
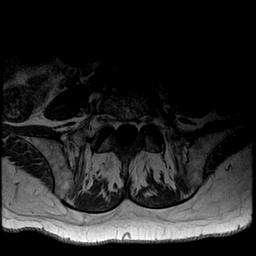
[im 11/40]
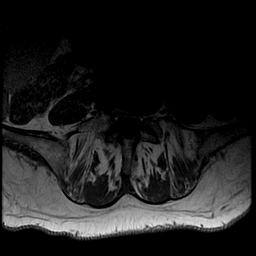
[im 14/40]
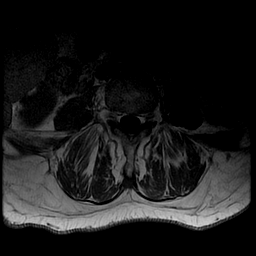
[im 16/40]
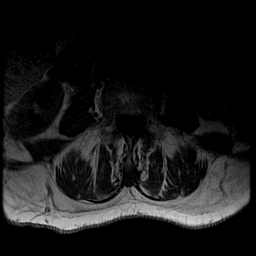
[im 19/40]
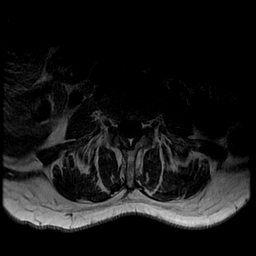
[im 21/40]
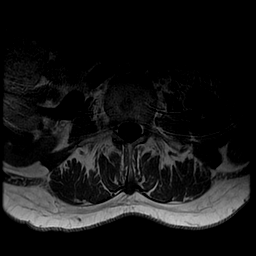
[im 24/40]
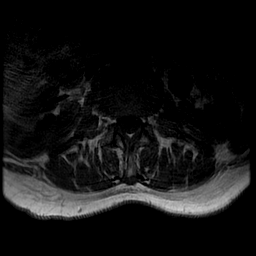
[im 27/40]
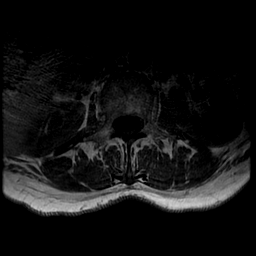
[im 29/40]
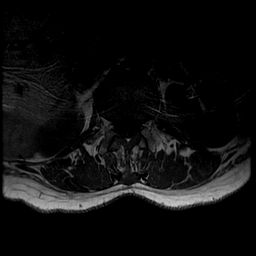
[im 32/40]
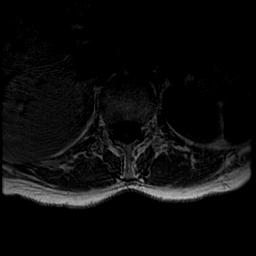
[im 34/40]
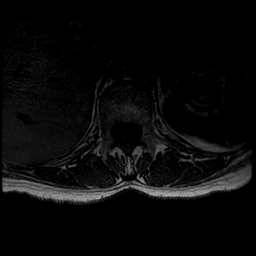
[im 37/40]
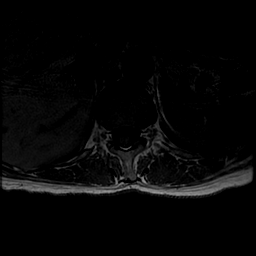
[im 40/40]
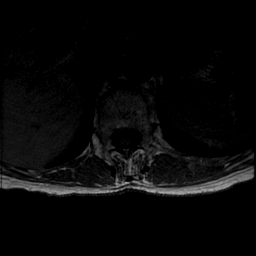

[48 of 48 positions shown; findings below may reference images not displayed]

FINDINGS: Prior studies demonstrate 5 lumbar-type vertebral bodies. The
alignment is normal. There is no evidence of fracture or pars
defect. There are scattered endplate degenerative changes and
Schmorl's nodes, similar to prior CT.

The conus medullaris extends to the L1-2 level and appears normal.
No paraspinal abnormalities are identified. Dilatation of the
thoracic duct in the right retrocrural area is unchanged from prior
CT.

There is mild disc bulging with anterior osteophyte formation at
T11-12.

There are no significant disc space findings at T12-L1 or L1-2.

L2-3: Mild disc bulging and facet hypertrophy. No spinal stenosis or
nerve root encroachment.

L3-4: Mild disc bulging with mild facet and ligamentous hypertrophy.
There is minimal narrowing of the left lateral recess without
definite nerve root encroachment. The foramina are patent.

L4-5: Mild disc bulging with mild facet and ligamentous hypertrophy.
No significant spinal stenosis or nerve root encroachment.

L5-S1: Disc height and hydration are maintained. There is mild
bilateral facet hypertrophy. No spinal stenosis or nerve root
encroachment.
IMPRESSION: 1. No acute findings or explanation for the patient's symptoms.
2. Mild thoracolumbar degenerative changes with disc bulging and
facet disease. No significant spinal stenosis or nerve root
encroachment.

## 2015-07-17 ENCOUNTER — Other Ambulatory Visit: Payer: Self-pay

## 2015-07-17 NOTE — Patient Outreach (Signed)
Unsuccessful attempt made to contact patient via telephone to schedule home viist and to assess for community care coordination needs. Call made to #[phone].  No message left, will attempt again to make contact with patient later in the week.  Plan: Attempt made to contact via telephone on Friday, December 16

## 2015-07-20 ENCOUNTER — Other Ambulatory Visit: Payer: Self-pay

## 2015-07-20 NOTE — Patient Outreach (Signed)
This RNCM was successful in making contact with patient who identified herself by providing date of birth and address. Patient states she is currently in ICU with her mother who had CABG on yesterday.   Patient and this RNCM agreed on contact via telephone on next week.  Plan: Telephone contact next week via telephone to schedule home visit.

## 2015-07-22 IMAGING — MR MR HEAD WO/W CM
7 of 11 series · 26 of 48 positions shown · IV contrast (agent unspecified)
Comparison: CT head earlier today. Most recent MR brain 08/16/2010.
Also prior CT scan 04/10/2004.

CLINICAL DATA: History of recurrent generalized tonic-clonic
seizures, with persistent seizures today. History of hemorrhage and
subsequent surgery for AVM.

EXAM:
MRI HEAD WITHOUT AND WITH CONTRAST
TECHNIQUE: Multiplanar, multiecho pulse sequences of the brain and surrounding
structures were obtained without and with intravenous contrast.

[Series 4: DWI · axial · 5.0mm · 1.09mm/px · z∈[-22,+121]mm · 7 of 60 slices shown (1 of 2)]
[im 1/60]
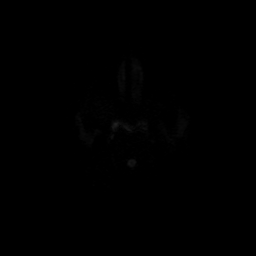
[im 10/60]
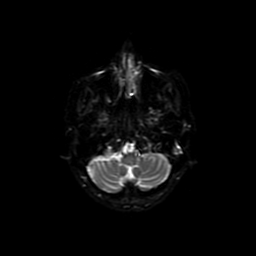
[im 20/60]
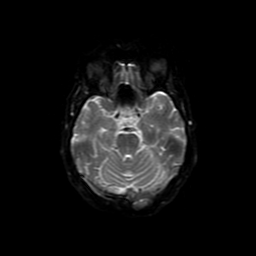
[im 30/60]
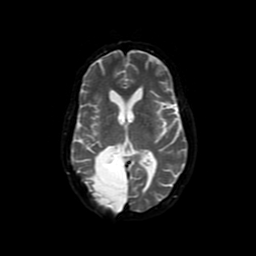
[im 40/60]
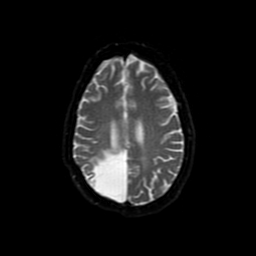
[im 50/60]
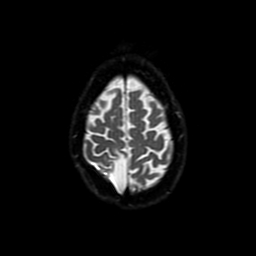
[im 60/60]
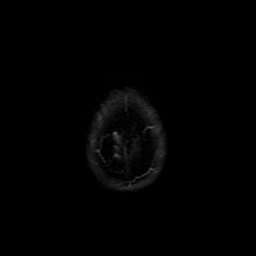

[Series 5: T2 · axial · 5.0mm · 0.43mm/px · z∈[-31,+116]mm · 3 of 24 slices shown (1 of 2)]
[im 1/24]
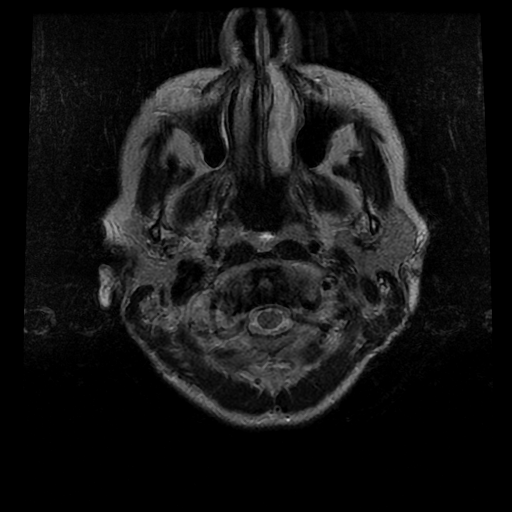
[im 12/24]
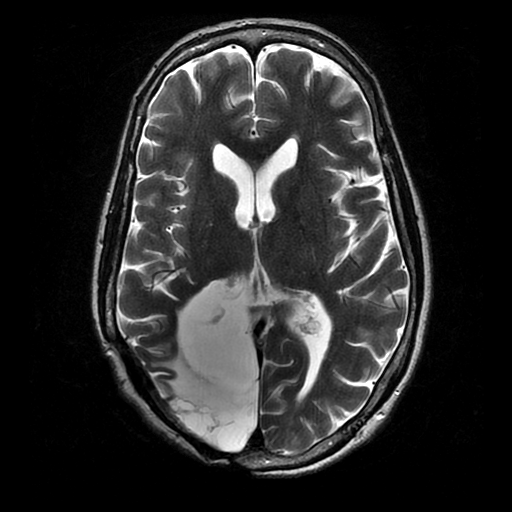
[im 24/24]
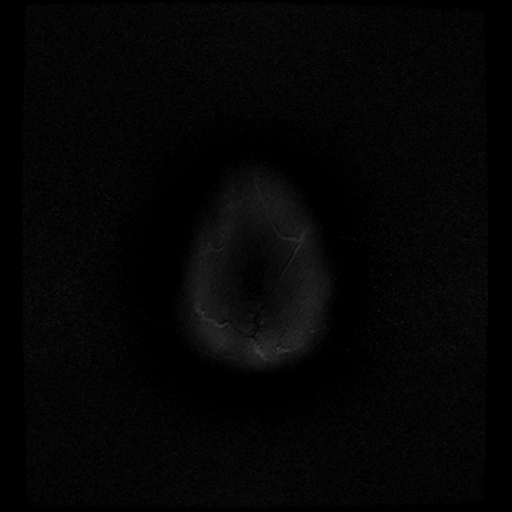

[Series 6: FLAIR · axial · 5.0mm · 0.43mm/px · z∈[-37,+122]mm · 3 of 24 slices shown]
[im 1/24]
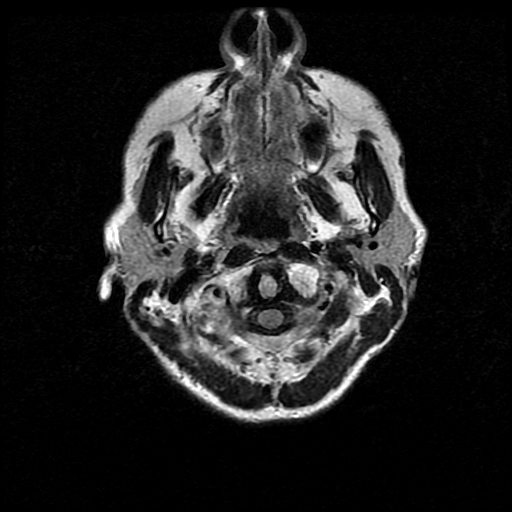
[im 12/24]
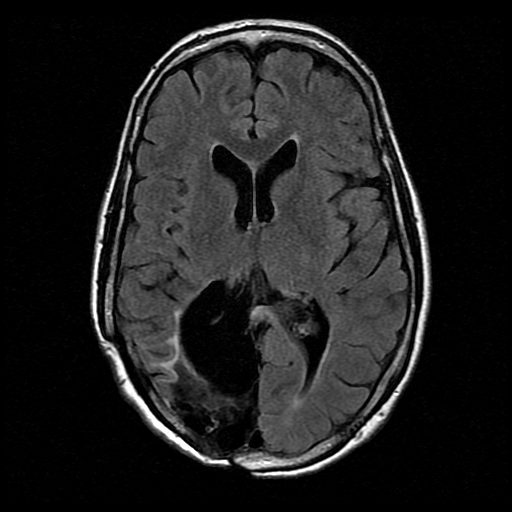
[im 24/24]
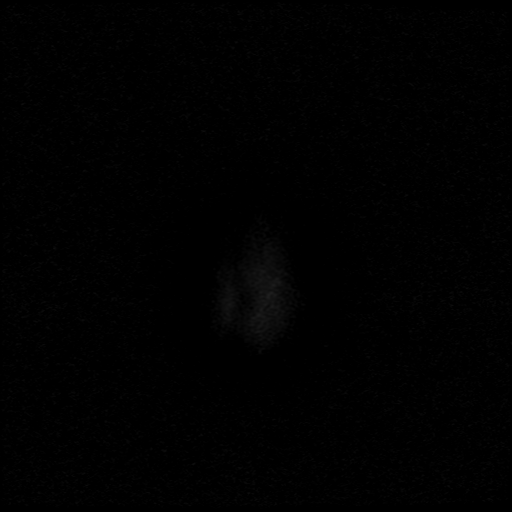

[Series 9: T2 · coronal · 3.0mm · 0.39mm/px · 5 of 37 slices shown (2 of 2)]
[im 1/37]
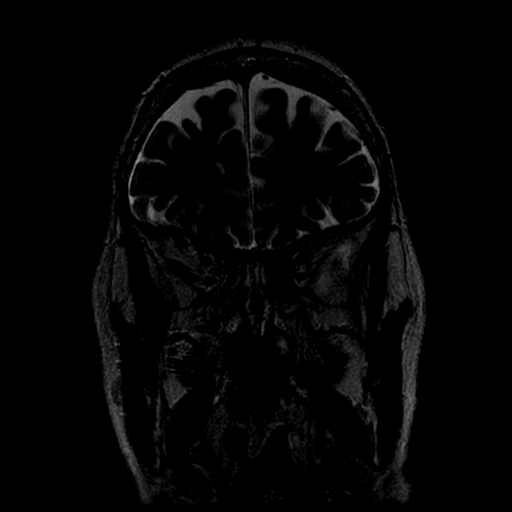
[im 10/37]
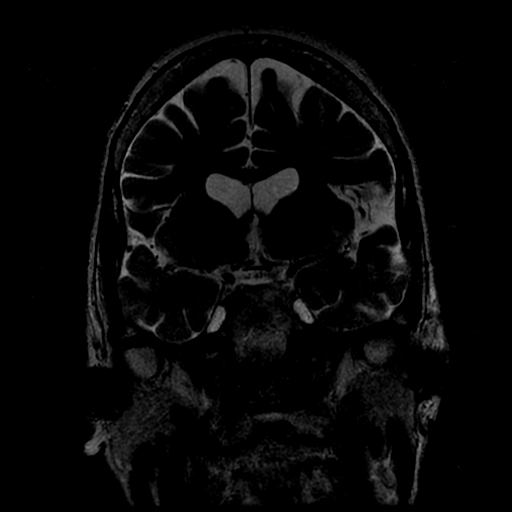
[im 19/37]
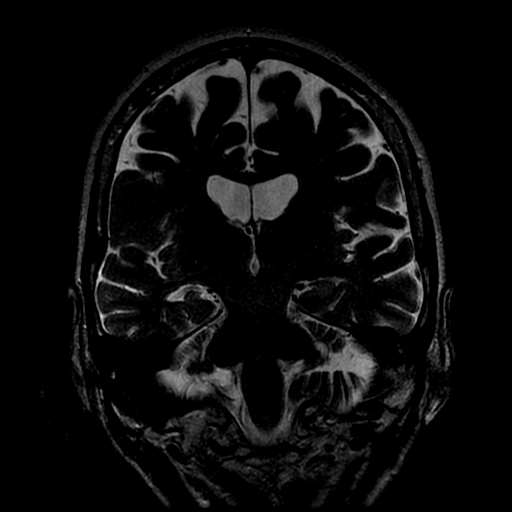
[im 28/37]
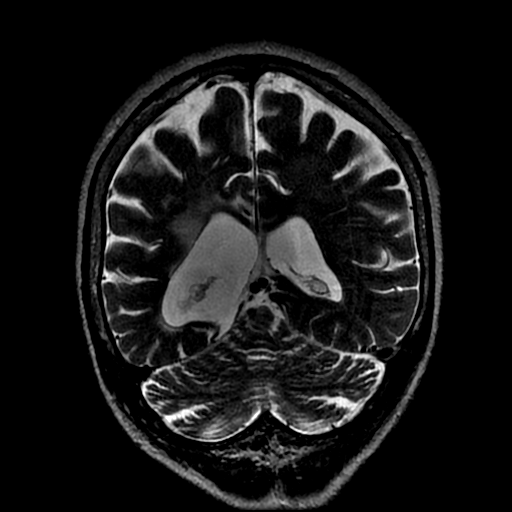
[im 37/37]
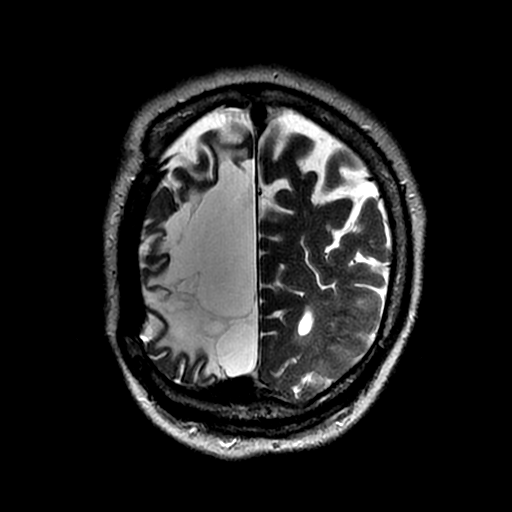

[Series 11: T2 post-contrast · coronal · 5.0mm · 0.45mm/px · 1 of 26 slices shown]
[im 1/26]
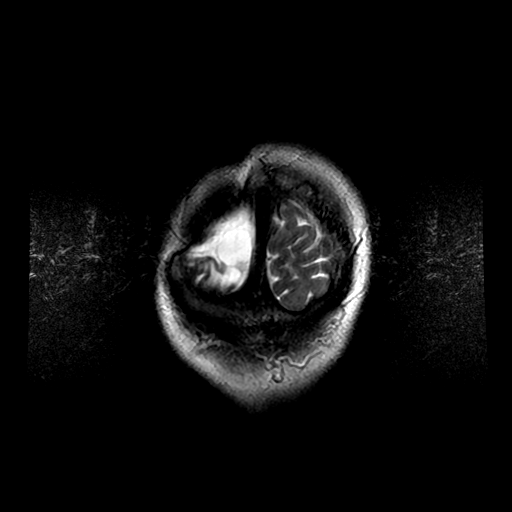

[Series 13: T1 post-contrast · coronal · 5.0mm · 0.45mm/px · 3 of 26 slices shown]
[im 1/26]
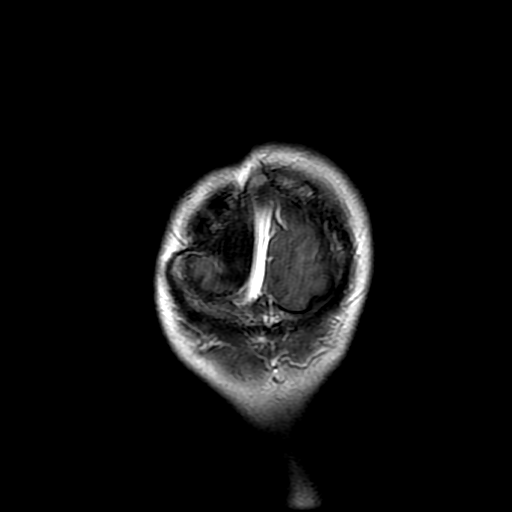
[im 13/26]
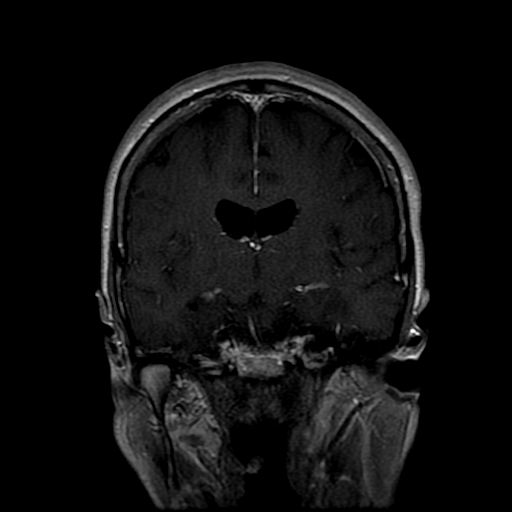
[im 26/26]
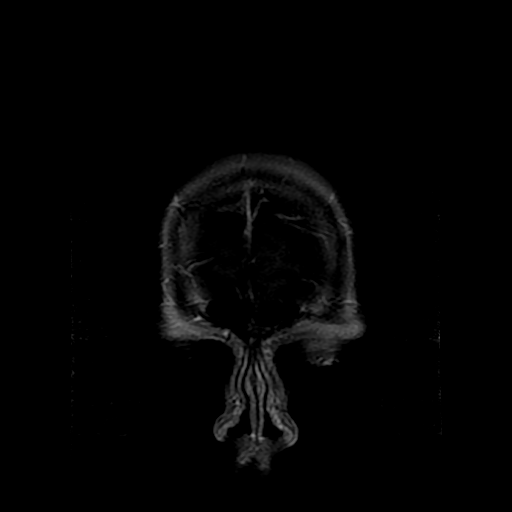

[Series 400: DWI · axial · 5.0mm · 1.09mm/px · z∈[-22,+121]mm · 4 of 30 slices shown (2 of 2)]
[im 1/30]
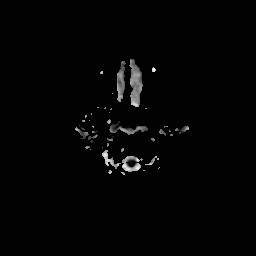
[im 10/30]
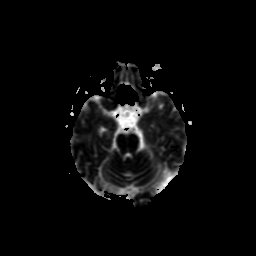
[im 20/30]
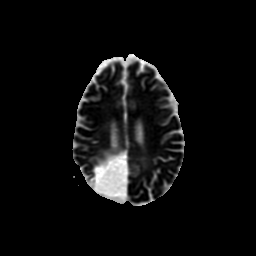
[im 30/30]
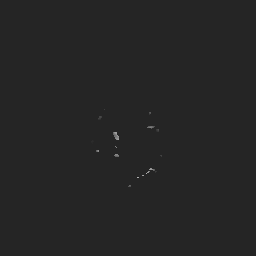

[26 of 48 positions shown; findings below may reference images not displayed]

FINDINGS: No restricted diffusion to suggest acute stroke or visible seizure
focus. Extensive right parieto-occipital encephalomalacia
representing prior hemorrhage and postsurgical change, unchanged
from priors.

Minimal cerebral and cerebellar atrophy. Mild subcortical and
periventricular T2 and FLAIR hyperintensities, likely chronic
microvascular ischemic change. No mass lesion, hydrocephalus, or
extra-axial fluid.

Chronic hemorrhage at the surgical site. View flow voids are
maintained in the carotid, basilar, and vertebral arteries.

Post infusion, no abnormal enhancement of the brain or meninges.
Mild dural enhancement at the surgical site is postoperative in
nature. No visible residual enhancing vascular nidus. No evidence
for venous sinus thrombosis.

Normal pituitary, cerebellar tonsils, and upper cervical region. No
osseous lesions. Negative orbits. No acute sinus disease. Mild left
mastoid fluid similar to priors.
IMPRESSION: Extensive right parieto-occipital encephalomalacia representing
prior hemorrhage and postsurgical change. No acute intracranial
findings. Similar appearance to prior CT and MR.

## 2015-07-22 IMAGING — CT CT HEAD W/O CM
2 of 4 series · 17 of 30 positions shown, 20 images · non-contrast
Comparison: 04/27/2013

CLINICAL DATA: Seizure history, history of brain surgery,
complaining of upper body tremors

EXAM:
CT HEAD WITHOUT CONTRAST
TECHNIQUE: Contiguous axial images were obtained from the base of the skull
through the vertex without intravenous contrast.

[Series 2: head w/o · axial · non-contrast · 0.48mm/px · z∈[-106,-1]mm · 10 of 27 slices shown, 13 images]
[im 3/27  brain]
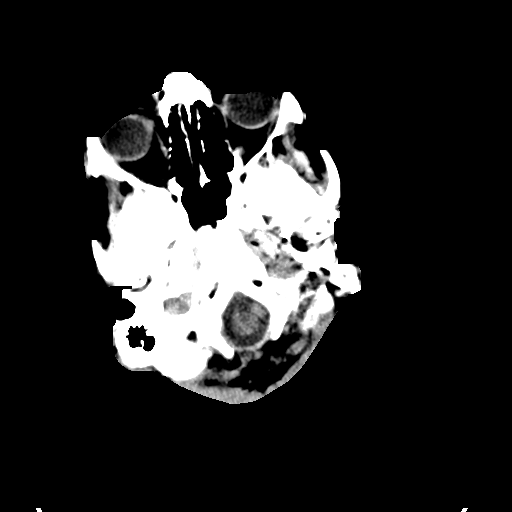
[im 3/27  bone]
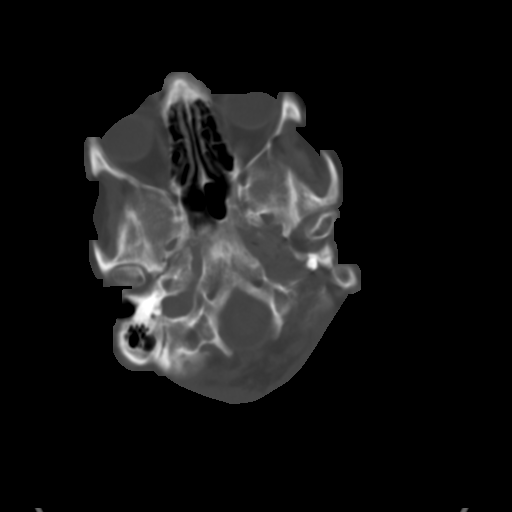
[im 5/27  brain]
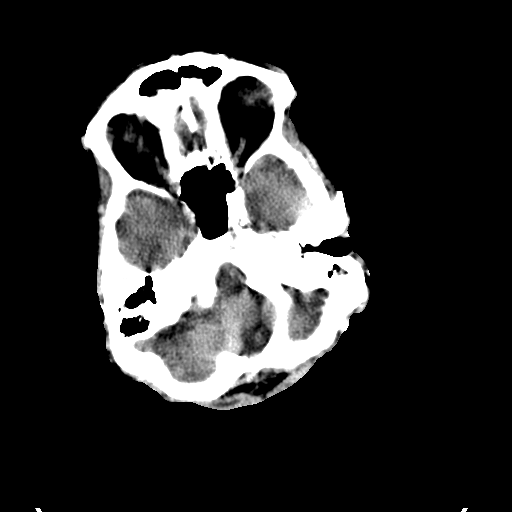
[im 8/27  brain]
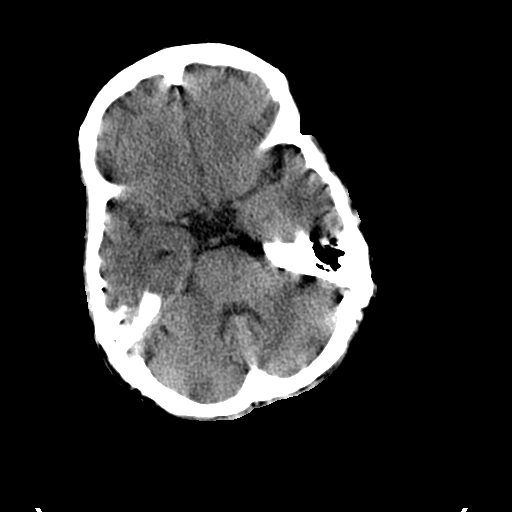
[im 10/27  brain]
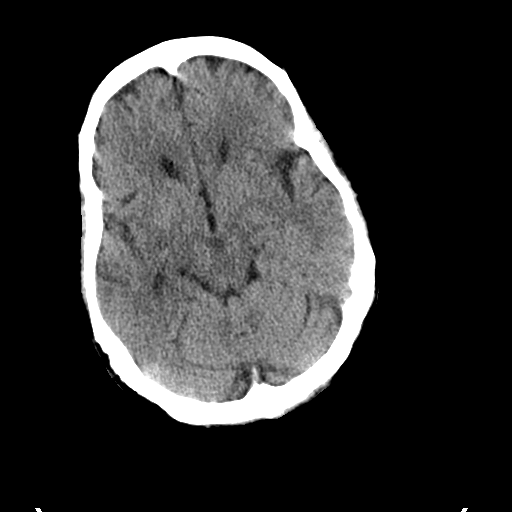
[im 12/27  brain]
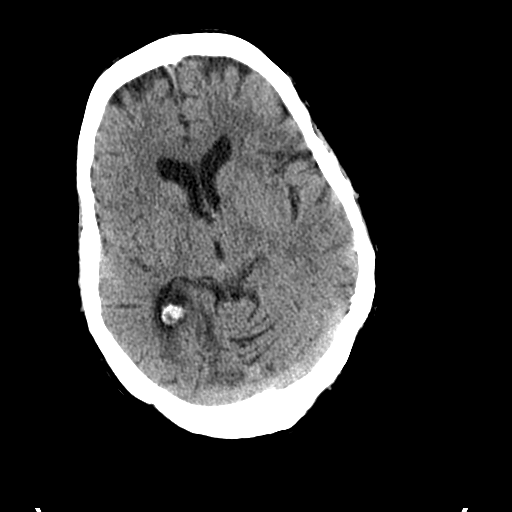
[im 12/27  bone]
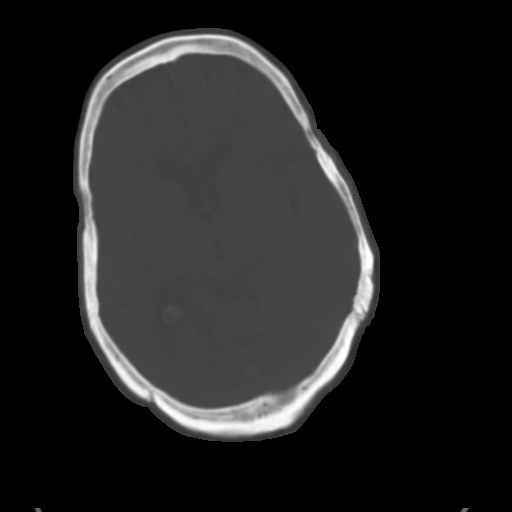
[im 15/27  brain]
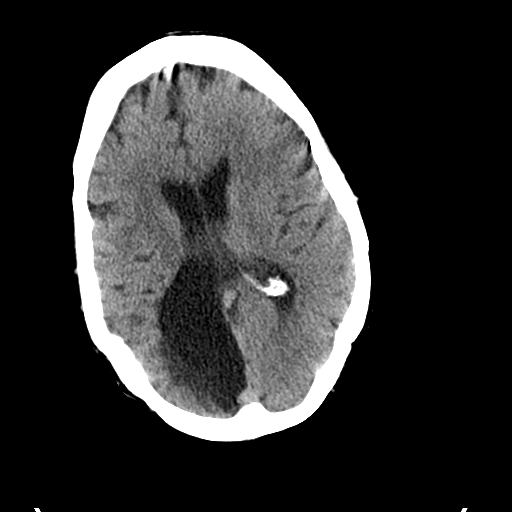
[im 17/27  brain]
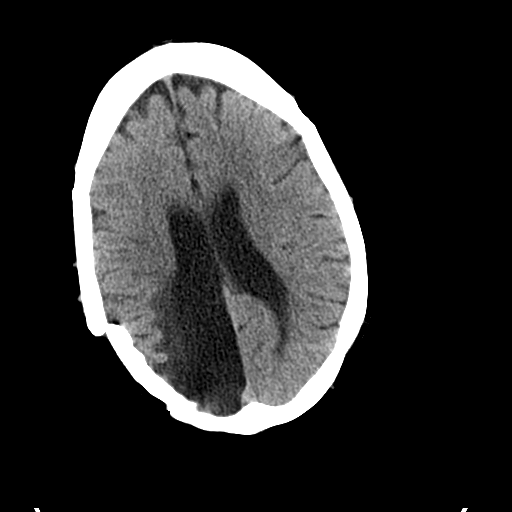
[im 19/27  brain]
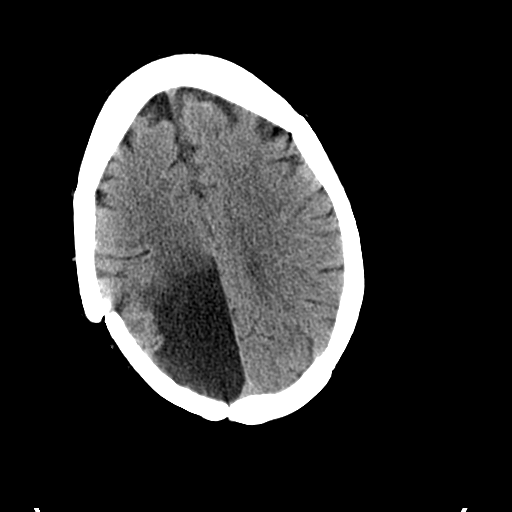
[im 22/27  brain]
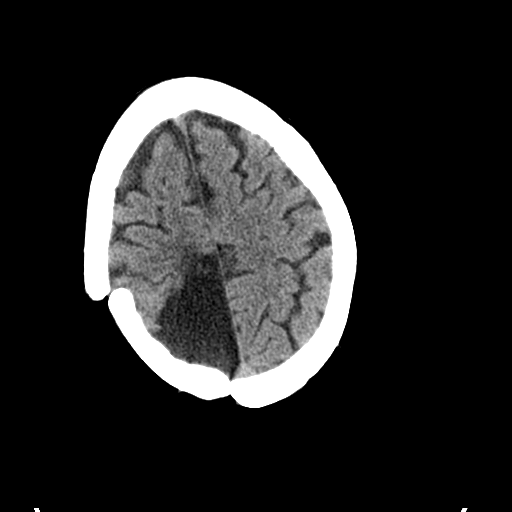
[im 22/27  bone]
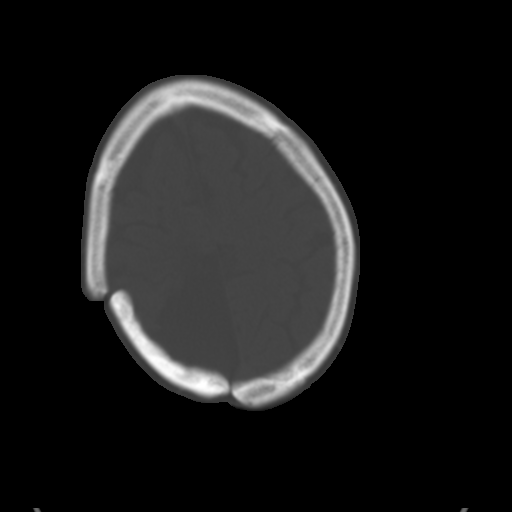
[im 24/27  brain]
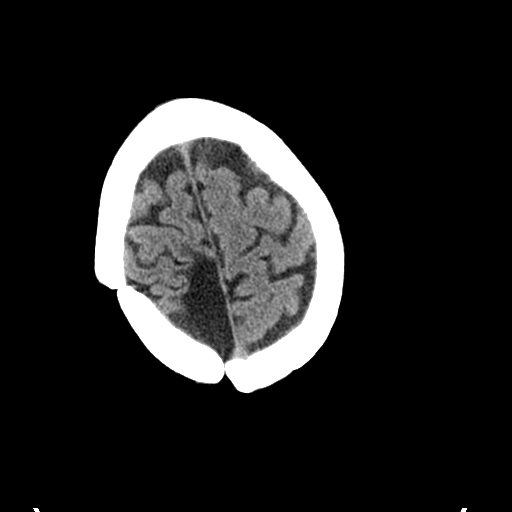

[Series 3: bone windows · axial · 0.48mm/px · z∈[-106,-11]mm · 7 of 27 slices shown]
[im 3/27  bone]
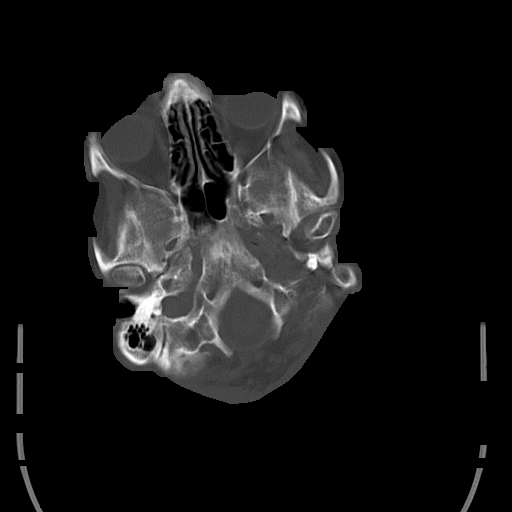
[im 5/27  bone]
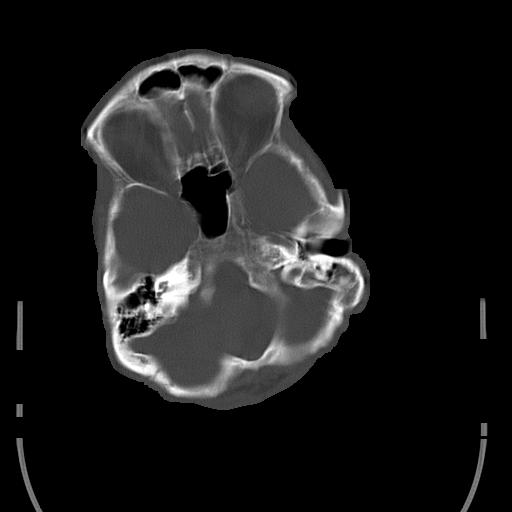
[im 10/27  bone]
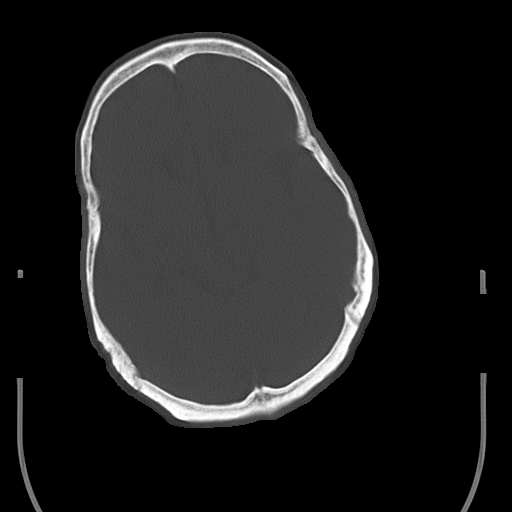
[im 12/27  bone]
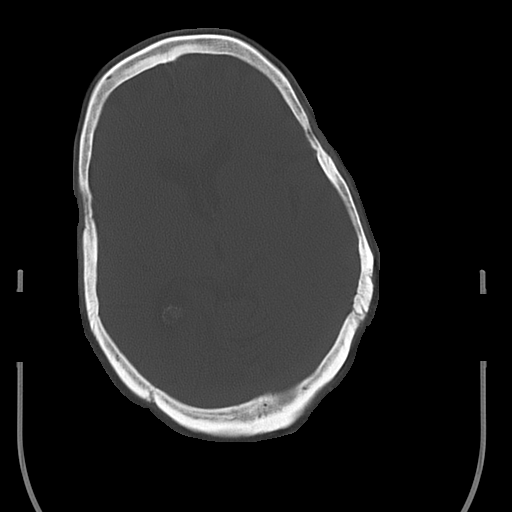
[im 15/27  bone]
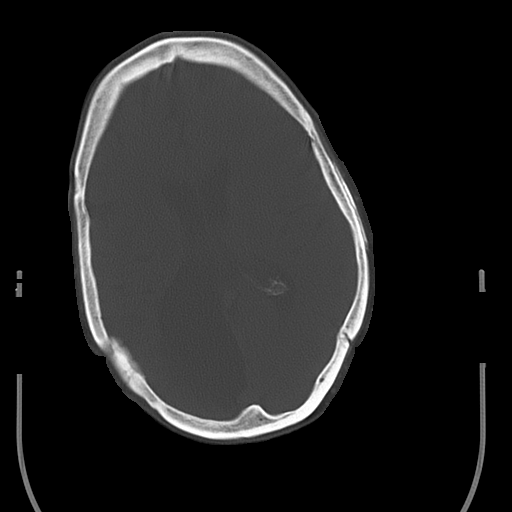
[im 17/27  bone]
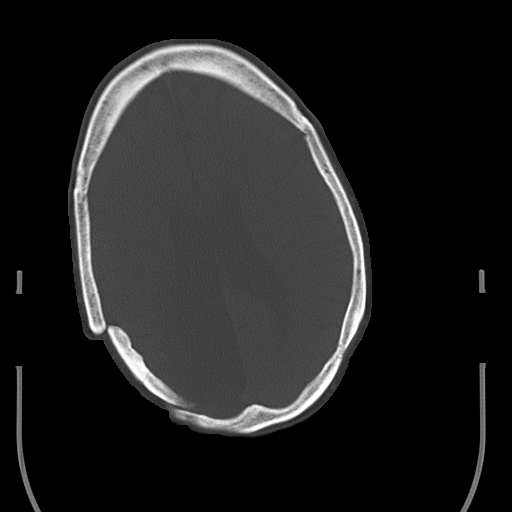
[im 22/27  bone]
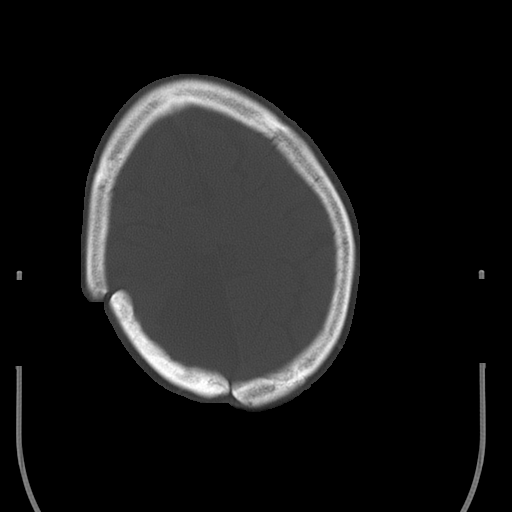

[17 of 30 positions shown; findings below may reference images not displayed]

FINDINGS: Status post right craniotomy. Large area of encephalomalacia right
parieto-occipital lobe, unchanged. No hemorrhage or acute infarct.
No extra-axial fluid. Stable ex vacuo dilatation right ventricle
occipital horn.
IMPRESSION: No change when compared to prior study. No acute findings.

## 2015-07-23 ENCOUNTER — Other Ambulatory Visit: Payer: Self-pay | Admitting: Licensed Clinical Social Worker

## 2015-07-23 NOTE — Patient Outreach (Signed)
[facility]) Care Management  07/23/2015  [name] [birth year] [id number]   Assessment-CSW completed outreach to patient on 07/23/15 but was unable to reach her. CSW left a HIPPA compliant voice message urging patient to return call once available.  Plan-CSW will remain available to patient for all social work needs. CSW will await return call from patient.  [name], BSW, MSW, [email or web address] Phone: [phone] Fax: [phone]

## 2015-07-27 ENCOUNTER — Other Ambulatory Visit: Payer: Self-pay

## 2015-07-27 NOTE — Patient Outreach (Signed)
Telephone contact made  With patient  For community care coordination. Patient identified herself using HIPPA identifiers by providing date of birth and address.  Patient states she is still concerned about her mother because she remains in ICU, is hallucinating and not doing so well.  Patient states her mother will need rehabilitation after being discharged from the hospital.  Patient and this RNCM updated patient's care management care plan.   Patent denies any COPD exacerbation since our last encounter.  Patient states she will be available for home visit after new years for continued assessment of community care coordination needs.  Plan: Telephone contact August 07, 2015

## 2015-08-07 ENCOUNTER — Other Ambulatory Visit: Payer: Self-pay

## 2015-08-07 NOTE — Patient Outreach (Signed)
This RNCM was successful in making contact with patient who identified herself using HIPPA identifiers. Patient gave date of birth and address as identifiers. Patient states her mother remains hospitalized, has to have additional surgeries.  Patient states she is staying away from the situation with her mother and not visiting her in the hospital for a few days.  Patient states she needs to rest before going back to see her mother.  Patient states compliance with medication and regimen and medical treatment. Patient reports having an episode of legs weakness when she and her daughter were getting out the car at [place].  She stated her leg went week for about 5 minutes, stated further that after the 5 minutes, her leg was just numb for several other minutes.  Patient describe this incident happening 5 days ago.  In addition, patient states she did not report this event to her primary care physician or other provider and a similar incident happened like this before.  Patient refuses to allow this RNCM to make appointment for follow up with her primary care physician.  States she has an appointment at [facility] in March.  This RNCM advised patient that she would have to report this incident to her primary care physician Dr. Maudie Mercury.  Plan: Telephone call on Tuesday, January 10 to schedule home visit.

## 2015-08-09 ENCOUNTER — Other Ambulatory Visit: Payer: Self-pay | Admitting: Licensed Clinical Social Worker

## 2015-08-09 NOTE — Patient Outreach (Signed)
[name]) Care Management  08/09/2015  [name] 22-Mar-1956 [id number]   Assessment-CSW completed outreach to patient on 08/09/15 and was able to successfully reach her. Patient provided identifiers. Patient reports that she is currently at the hospital visiting her mother but has time to talk. Patient shares that her mother has been making improvements but has to have heart surgery. Patient reports that she has recently became sick, having coughing fits and throat hurting. CSW educated patient on self-care methods and suggested scheduling an appointment with PCP if needed. Patient reports that she has been experiencing weakness in her left leg for over the past month. Patient has updated RNCM. Patient communicates that she has not had any seizures within the past two weeks. Patient reminded of [name] assessment appointment on 08/13/15 at 10:30 am. Patient reports "I will try to make it." CSW informed her that transportation was suppose to be arranged. CSW reminded patient on [name] and [employer] as well that are available to her to use. CSW strongly encouraged patient to complete [name] assessment. CSW will complete outreach within two weeks to follow up on social work needs.  Plan-CSW will continue to be available to patient for all social work needs. CSW will continue to review community resources available to patient in order to assist her with gaining stable transportation.  [name], BSW, MSW, [email or web address] Phone: [phone] Fax: [phone]

## 2015-08-14 ENCOUNTER — Other Ambulatory Visit: Payer: Self-pay | Admitting: Licensed Clinical Social Worker

## 2015-08-14 NOTE — Patient Outreach (Signed)
[place] [facility]) Care Management  08/14/2015  [name] 11-11-1955 [id number]  Assessment-CSW completed outreach to patient on 08/14/15 to inquire about [name] appointment as their office was closed on the day her assessment appointment was scheduled due to inclement weather. CSW was unable to reach patient but left a HIPPA compliant voice message encouraging patient to return call once available.  Plan-CSW will await call back from patient or complete second outreach by 08/23/15.  [name], BSW, MSW, [email or web address] Phone: [phone] Fax: [phone]

## 2015-08-16 ENCOUNTER — Other Ambulatory Visit: Payer: Self-pay | Admitting: Licensed Clinical Social Worker

## 2015-08-16 NOTE — Patient Outreach (Signed)
[place] [facility]) Care Management  08/16/2015  [name] 04-04-1956 [id number]   Assessmtent-CSW completed outreach to on 08/16/15 and was able to reach her successfully. Patient provided HIPPA verifications. Patient had an assessment appointment with [name] on 08/13/15 but they were closed due to inclement weather. Patient reports that she has not heard back from [name] in order to reschedule appointment. CSW will contact Anderson Malta Allision from [name] to inquire. Patient reports that she continues to have weekly seizures and experienced one this week in the bathroom. Patient questioned if causing weight could trigger her seizures. CSW suggested that she discuss this with her doctor and [place]. CSW provided [facility] contact information. Patient reports the back of her right leg was injured. Patient was educated on self-care methods as her mother continues to be in the hospital with ongoing ealth complications. CSW offered emotional support and reflective listening.   Plan-CSW left a HIPPA compliant voice message for Anderson Malta Allision at [employer]. CSW will assist patient in rescheduling [name] eligibility appointment.  [name], [name], [name], [name] Phone: [phone] Fax: [phone]

## 2015-08-21 ENCOUNTER — Other Ambulatory Visit: Payer: Self-pay | Admitting: Licensed Clinical Social Worker

## 2015-08-21 NOTE — Patient Outreach (Signed)
[place] [facility]) Care Management  08/21/2015  [name] September 29, 1955 [id number]   Assessment-CSW contacted [name] from [name] to inquire about rescheduling [name] assessment appointment as patient states that she has not heard from [place]. [name] states that she made an outreach attempt on 08/14/15 in order to reschedule appointment but was not able to reach her. [name] reports that she also mailed out a letter informing her that she was unable tor reach her and to please contact [name]. [name] states that she will make an additional outreach and will set her reschedule assessment appointment for 08/28/15 at 11:00 am. CSW informed [name] that she will update patient if she is unable to reach her.  CSW made outreach to patient but was unable to reach her successfully. CSW left a HIPPA compliant voice message encouraging her to return call once available.  Plan-CSW will make second outreach attempt by 08/24/15 to inform her of her upcoming [name] appointment.  [name], BSW, MSW, [name] Phone: [phone] Fax: [phone]

## 2015-08-22 ENCOUNTER — Other Ambulatory Visit: Payer: Self-pay | Admitting: Licensed Clinical Social Worker

## 2015-08-22 NOTE — Patient Outreach (Signed)
[place] [facility]) Care Management  08/22/2015  TANIESHA GLICKSTEIN 1956/04/17 [id number]   Assessment-CSW completed second outreach attempt in order to remind patient of rescheduled [name] appointment for 08/28/15. CSW unable to reach patient but left an additional HIPPA compliant voice message.  Plan-CSW will continue to attempt outreaches in order to update patient and assist patient in gaining stable transportation.  [name], BSW, MSW, [email or web address] Phone: [phone] Fax: [phone]

## 2015-08-30 ENCOUNTER — Other Ambulatory Visit: Payer: Self-pay | Admitting: Licensed Clinical Social Worker

## 2015-08-30 NOTE — Patient Outreach (Signed)
[place] [facility]) Care Management  08/30/2015  [name] 09-26-1955 [id number]   Assessment-CSW to completed outreach to patient to follow up on rescheduled [name] appointment. Patient answered and stated that she was unable to go to the [name] appointment on 08/28/15 due to her mother being readmitted into the hospital and due to bursting pipes and no working water. Patient reports that she had to stay at her residence in order for the plumber to come and fix the problem. Patient reports that she contacted [name] that morning and informed them that she would be unable to go to appointment but that [name] transportation still came to her residence. CSW provided patient with Anderson Malta Alison's contact number so that patient can call and reschedule appointment if allowed. Patient agreed to do so. Patient reports she had a seizure over the weekend. Patient reports that she is experiencing a lot of stress but is using healthy coping mechanisms.  Plan-CSW will make next outreach by 09/07/15.   [name], BSW, MSW, [name] Phone: [phone] Fax: [phone]

## 2015-08-30 NOTE — Patient Outreach (Signed)
[place] [facility]) Care Management  08/30/2015  RICK MOSHE May 14, 1956 [id number]   Assessment-CSW contacted [name] at [place]. Anderson Malta stated that patient did contact her and that appointment was rescheduled for 09/18/15.  Plan-CSW will contact [place] on 09/17/15 to remind her of [name] appointment.  [name], BSW, MSW, [email or web address] Phone: [phone] Fax: [phone]

## 2015-09-17 ENCOUNTER — Other Ambulatory Visit: Payer: Self-pay | Admitting: Licensed Clinical Social Worker

## 2015-09-17 ENCOUNTER — Other Ambulatory Visit: Payer: Self-pay

## 2015-09-17 ENCOUNTER — Encounter: Payer: Self-pay | Admitting: Licensed Clinical Social Worker

## 2015-09-17 NOTE — Patient Outreach (Signed)
[place] [facility]) Care Management  09/17/2015  [name] 03/08/1956 [id number]   Assessment-CSW received return call from patient shortly after outreach attempt. CSW reminded patient on her upcoming [name] assessment appointment on 09/18/15. Patient reports that she has already called and canceled appointment. Patient reports that she does not feel well. Patient states having a seizure over the weekend. CSW questioned if she rescheduled [name] appointment and she declined stating "I didn't feel like it. I just have too much going on right now so I'm not sure if I want to do the appointment." Patient was a no show for last scheduled [name] assessment appointment. CSW informed patient that if she does not wish to gain transportation services with [name] that she can still use [employer]. Patient did not follow up with [name] and CSW reminded patient on how to gain services with the agency if she desired to. CSW will complete social work discharge at this time.  Plan-CSW will update RNCM of social work discharge and will send case closure letter to PCP.  [name], BSW, MSW, [email or web address] Phone: [phone] Fax: [phone]

## 2015-09-17 NOTE — Patient Outreach (Signed)
[place] [facility]) Care Management  09/17/2015  [name] [birth year] [id number]   Assessment-CSW completed outreach to patient on 09/17/15 in order to remind her of her scheduled assessment appointment with [name] on 09/18/15. CSW unable to reach patient. CSW left a HIPPA compliant voice message.  Plan-CSW will continue to provide social work assistance to ensure that patient will gain stable transportation.  [name], BSW, MSW, [email or web address] Phone: [phone] Fax: [phone]

## 2015-10-11 DIAGNOSIS — Z79899 Other long term (current) drug therapy: Secondary | ICD-10-CM | POA: Diagnosis not present

## 2015-10-11 DIAGNOSIS — Z885 Allergy status to narcotic agent status: Secondary | ICD-10-CM | POA: Diagnosis not present

## 2015-10-11 DIAGNOSIS — F17200 Nicotine dependence, unspecified, uncomplicated: Secondary | ICD-10-CM | POA: Diagnosis not present

## 2015-10-11 DIAGNOSIS — Z86718 Personal history of other venous thrombosis and embolism: Secondary | ICD-10-CM | POA: Diagnosis not present

## 2015-10-11 DIAGNOSIS — J45909 Unspecified asthma, uncomplicated: Secondary | ICD-10-CM | POA: Diagnosis not present

## 2015-10-11 DIAGNOSIS — Z888 Allergy status to other drugs, medicaments and biological substances status: Secondary | ICD-10-CM | POA: Diagnosis not present

## 2015-10-11 DIAGNOSIS — G40219 Localization-related (focal) (partial) symptomatic epilepsy and epileptic syndromes with complex partial seizures, intractable, without status epilepticus: Secondary | ICD-10-CM | POA: Diagnosis not present

## 2015-10-11 DIAGNOSIS — Z8673 Personal history of transient ischemic attack (TIA), and cerebral infarction without residual deficits: Secondary | ICD-10-CM | POA: Diagnosis not present

## 2015-10-11 DIAGNOSIS — G249 Dystonia, unspecified: Secondary | ICD-10-CM | POA: Diagnosis not present

## 2015-10-31 ENCOUNTER — Emergency Department (HOSPITAL_COMMUNITY): Payer: Commercial Managed Care - HMO

## 2015-10-31 ENCOUNTER — Encounter (HOSPITAL_COMMUNITY): Payer: Self-pay | Admitting: Emergency Medicine

## 2015-10-31 ENCOUNTER — Emergency Department (HOSPITAL_COMMUNITY)
Admission: EM | Admit: 2015-10-31 | Discharge: 2015-10-31 | Disposition: A | Discharge status: Home / Self Care | Payer: Commercial Managed Care - HMO | Source: Home / Self Care | Attending: Emergency Medicine | Admitting: Emergency Medicine

## 2015-10-31 DIAGNOSIS — Z8673 Personal history of transient ischemic attack (TIA), and cerebral infarction without residual deficits: Secondary | ICD-10-CM | POA: Insufficient documentation

## 2015-10-31 DIAGNOSIS — Z8711 Personal history of peptic ulcer disease: Secondary | ICD-10-CM | POA: Diagnosis not present

## 2015-10-31 DIAGNOSIS — J449 Chronic obstructive pulmonary disease, unspecified: Secondary | ICD-10-CM | POA: Insufficient documentation

## 2015-10-31 DIAGNOSIS — R269 Unspecified abnormalities of gait and mobility: Secondary | ICD-10-CM | POA: Diagnosis not present

## 2015-10-31 DIAGNOSIS — Z8739 Personal history of other diseases of the musculoskeletal system and connective tissue: Secondary | ICD-10-CM

## 2015-10-31 DIAGNOSIS — Z882 Allergy status to sulfonamides status: Secondary | ICD-10-CM | POA: Diagnosis not present

## 2015-10-31 DIAGNOSIS — R569 Unspecified convulsions: Secondary | ICD-10-CM

## 2015-10-31 DIAGNOSIS — G40909 Epilepsy, unspecified, not intractable, without status epilepticus: Secondary | ICD-10-CM | POA: Diagnosis not present

## 2015-10-31 DIAGNOSIS — Z86718 Personal history of other venous thrombosis and embolism: Secondary | ICD-10-CM

## 2015-10-31 DIAGNOSIS — G8929 Other chronic pain: Secondary | ICD-10-CM

## 2015-10-31 DIAGNOSIS — Z8719 Personal history of other diseases of the digestive system: Secondary | ICD-10-CM | POA: Insufficient documentation

## 2015-10-31 DIAGNOSIS — Z87442 Personal history of urinary calculi: Secondary | ICD-10-CM | POA: Diagnosis not present

## 2015-10-31 DIAGNOSIS — Y92009 Unspecified place in unspecified non-institutional (private) residence as the place of occurrence of the external cause: Secondary | ICD-10-CM

## 2015-10-31 DIAGNOSIS — Z885 Allergy status to narcotic agent status: Secondary | ICD-10-CM | POA: Diagnosis not present

## 2015-10-31 DIAGNOSIS — J45909 Unspecified asthma, uncomplicated: Secondary | ICD-10-CM | POA: Diagnosis not present

## 2015-10-31 DIAGNOSIS — M542 Cervicalgia: Secondary | ICD-10-CM | POA: Diagnosis not present

## 2015-10-31 DIAGNOSIS — Z8249 Family history of ischemic heart disease and other diseases of the circulatory system: Secondary | ICD-10-CM | POA: Diagnosis not present

## 2015-10-31 DIAGNOSIS — Z79899 Other long term (current) drug therapy: Secondary | ICD-10-CM

## 2015-10-31 DIAGNOSIS — X58XXXA Exposure to other specified factors, initial encounter: Secondary | ICD-10-CM | POA: Insufficient documentation

## 2015-10-31 DIAGNOSIS — F17210 Nicotine dependence, cigarettes, uncomplicated: Secondary | ICD-10-CM

## 2015-10-31 DIAGNOSIS — K219 Gastro-esophageal reflux disease without esophagitis: Secondary | ICD-10-CM | POA: Diagnosis present

## 2015-10-31 DIAGNOSIS — Y998 Other external cause status: Secondary | ICD-10-CM

## 2015-10-31 DIAGNOSIS — M549 Dorsalgia, unspecified: Secondary | ICD-10-CM | POA: Diagnosis present

## 2015-10-31 DIAGNOSIS — S42001A Fracture of unspecified part of right clavicle, initial encounter for closed fracture: Secondary | ICD-10-CM | POA: Diagnosis not present

## 2015-10-31 DIAGNOSIS — Y9389 Activity, other specified: Secondary | ICD-10-CM | POA: Insufficient documentation

## 2015-10-31 DIAGNOSIS — N390 Urinary tract infection, site not specified: Secondary | ICD-10-CM | POA: Diagnosis not present

## 2015-10-31 DIAGNOSIS — Z8679 Personal history of other diseases of the circulatory system: Secondary | ICD-10-CM | POA: Diagnosis not present

## 2015-10-31 DIAGNOSIS — S42031A Displaced fracture of lateral end of right clavicle, initial encounter for closed fracture: Secondary | ICD-10-CM

## 2015-10-31 DIAGNOSIS — S42001D Fracture of unspecified part of right clavicle, subsequent encounter for fracture with routine healing: Secondary | ICD-10-CM | POA: Diagnosis not present

## 2015-10-31 DIAGNOSIS — R05 Cough: Secondary | ICD-10-CM | POA: Diagnosis not present

## 2015-10-31 DIAGNOSIS — Z888 Allergy status to other drugs, medicaments and biological substances status: Secondary | ICD-10-CM | POA: Diagnosis not present

## 2015-10-31 LAB — PHENYTOIN LEVEL, TOTAL: PHENYTOIN LVL: 23.3 ug/mL — AB (ref 10.0–20.0)

## 2015-10-31 MED ORDER — PHENYTOIN SODIUM EXTENDED 100 MG PO CAPS
400.0000 mg | ORAL_CAPSULE | ORAL | Status: DC
  Administered 2015-10-31: 400 mg via ORAL
  Filled 2015-10-31: qty 4

## 2015-10-31 MED ORDER — PROPOFOL 10 MG/ML IV BOLUS: 0.5000 mg/kg | Freq: Once | INTRAVENOUS | Status: DC

## 2015-10-31 MED ORDER — FENTANYL CITRATE (PF) 100 MCG/2ML IJ SOLN
100.0000 ug | Freq: Once | INTRAMUSCULAR | Status: AC
  Administered 2015-10-31: 100 ug via INTRAVENOUS
  Filled 2015-10-31: qty 2

## 2015-10-31 MED ORDER — OXYCODONE HCL 5 MG PO TABS: 5.0000 mg | ORAL_TABLET | ORAL | Status: DC | PRN

## 2015-10-31 NOTE — ED Notes (Signed)
Per EMS, from home, around 16:30 her daughter heard a "thud" and found patient lying on her right side on the floor beside the bed. Daughter believes she had had a seizure. Now having right shoulder pain, refuses to move arm d/t pain. Pt believes she's dislocated her shoulder. Abrasion to right shoulder.   50 mcg Fentanyl given intranasally, then 200 mcg Fentanyl given IV

## 2015-10-31 NOTE — ED Provider Notes (Signed)
CSN: [id number]     Arrival date & time 10/31/15  1803 History   First MD Initiated Contact with [name] 10/31/15 1847     Chief Complaint  [name] presents with  . Shoulder Injury  . Seizures     (Consider location/radiation/quality/duration/timing/severity/associated sxs/prior Treatment) [name] is a 60 y.o. female presenting with shoulder injury and seizures. The history is provided by the [name].  Shoulder Injury This is a new problem. The current episode started less than 1 hour ago. The problem occurs constantly. The problem has not changed since onset.Pertinent negatives include no chest pain, no abdominal pain, no headaches and no shortness of breath. The symptoms are aggravated by bending and twisting. She has tried nothing for the symptoms. The treatment provided no relief.  Seizures  60 yo F With a chief complaint of right shoulder pain. This happened after she had a seizure. Per her daughter she thinks she fell off the bed and landed on her right shoulder. And had seizure-like activity on the ground. At her normal post ictal. Has a baseline upon arrival to the ED. Complaining mostly of right shoulder pain. Denies any other issues. Is on Vimpat and Dilantin. Denies noncompliance.  Past Medical History  Diagnosis Date  . DVT (deep venous thrombosis) ([place])     "she's had several since 1990"  . Asthma   . 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 Chronic back pain   . DDD (degenerative disc disease) 06/12/2013  . Kidney stones   . Chronic bronchitis ([place])     "gets it q yr"  . History of blood transfusion     "when she was a baby"  . History of stomach ulcers   . GERD (gastroesophageal reflux disease)   . Headache     "usually around time when she's had a seizure"  . Seizures ([place])     "because of her brain surgery"  . Stroke [facility]) [phone]    family denies residual on 11/09/2014  . Tunnel vision     "since brain OR"  . COPD (chronic obstructive pulmonary disease) [facility])    Past  Surgical History  Procedure Laterality Date  . Cesarean section  [id number]; 1987; 1989  . Brain surgery  1989    craniotomy with hematoma evacuation  . Hemorrhoid surgery    . Tubal ligation  1990's   Family History  Problem Relation Age of Onset  . Heart attack Mother   . Heart attack Father    Social History  Substance Use Topics  . Smoking status: Current Every Day Smoker -- 1.50 packs/day for 40 years    Types: Cigarettes  . Smokeless tobacco: Never Used  . Alcohol Use: No   OB History    No data available     Review of Systems  Constitutional: Negative for fever and chills.  HENT: Negative for congestion and rhinorrhea.   Eyes: Negative for redness and visual disturbance.  Respiratory: Negative for shortness of breath and wheezing.   Cardiovascular: Negative for chest pain and palpitations.  Gastrointestinal: Negative for nausea, vomiting and abdominal pain.  Genitourinary: Negative for dysuria and urgency.  Musculoskeletal: Positive for myalgias and arthralgias.  Skin: Negative for pallor and wound.  Neurological: Positive for seizures. Negative for dizziness and headaches.      Allergies  Zonisamide; Codeine; Keppra; Pregabalin; and Olive oil  Home Medications   Prior to Admission medications   Medication Sig Start Date End Date Taking? Authorizing Provider  albuterol (PROVENTIL HFA;VENTOLIN HFA) 108 (  90 Base) MCG/ACT inhaler Inhale 2 puffs into the lungs every 6 (six) hours as needed for wheezing or shortness of breath.   Yes Historical Provider, MD  feeding supplement, ENSURE ENLIVE, (ENSURE ENLIVE) LIQD Take 237 mLs by mouth 2 (two) times daily between meals. 04/01/15  Yes Maryann Mikhail, DO  lacosamide (VIMPAT) 200 MG TABS tablet Take 200 mg by mouth 2 (two) times daily.   Yes Historical Provider, MD  phenytoin (DILANTIN) 100 MG ER capsule Take 2 capsules (200 mg total) by mouth 2 (two) times daily. Must get a Dilantin level checked by her primary care physician  within a week [name] taking differently: Take 100-200 mg by mouth 2 (two) times daily. Take 100 mg every morning and 200 mg at bedtime. Must get a Dilantin level checked by her primary care physician within a week 01/19/15  Yes Thurnell Lose, MD  phenytoin (DILANTIN) 30 MG ER capsule Take 60 mg by mouth every morning.   Yes Historical Provider, MD  Vitamin D, Ergocalciferol, (DRISDOL) 50000 UNITS CAPS capsule Take 50,000 Units by mouth once a week. On Wednesdays 12/15/14  Yes Historical Provider, MD  oxyCODONE (ROXICODONE) 5 MG immediate release tablet Take 1 tablet (5 mg total) by mouth every 4 (four) hours as needed for severe pain. 10/31/15   Deno Etienne, DO   BP 101/59 mmHg  Pulse 74  Temp(Src) 99.1 F (37.3 C) (Oral)  Resp 16  SpO2 95% Physical Exam  Constitutional: She is oriented to person, place, and time. She appears well-developed and well-nourished. No distress.  HENT:  Head: Normocephalic and atraumatic.  Eyes: EOM are normal. Pupils are equal, round, and reactive to light.  Neck: Normal range of motion. Neck supple.  Cardiovascular: Normal rate and regular rhythm.  Exam reveals no gallop and no friction rub.   No murmur heard. Pulmonary/Chest: Effort normal. She has no wheezes. She has no rales.  Abdominal: Soft. She exhibits no distension. There is no tenderness.  Musculoskeletal: She exhibits no edema or tenderness.  Neurological: She is alert and oriented to person, place, and time.  Skin: Skin is warm and dry. She is not diaphoretic.  Psychiatric: She has a normal mood and affect. Her behavior is normal.  Nursing note and vitals reviewed.   ED Course  Procedures (including critical care time) Labs Review Labs Reviewed  PHENYTOIN LEVEL, TOTAL - Abnormal; Notable for the following:    Phenytoin Lvl 23.3 (*)    All other components within normal limits  CBG MONITORING, ED    Imaging Review Dg Shoulder Right  10/31/2015  CLINICAL DATA:  60 year old female with  seizure and right shoulder injury. EXAM: RIGHT SHOULDER - 2+ VIEW COMPARISON:  Radiograph dated 01/10/2015 FINDINGS: There is a displaced fracture of the distal aspect the right clavicle with approximately 6 mm superior displacement of the distal fracture fragment. The visualized portion of the humerus appears intact. No other fracture identified. The glenohumeral joint alignment is preserved. The soft tissues appear unremarkable. IMPRESSION: Mildly displaced fracture of the distal aspect of the right clavicle. Electronically Signed   By: Anner Crete M.D.   On: 10/31/2015 19:13   I have personally reviewed and evaluated these images and lab results as part of my medical decision-making.   EKG Interpretation None      MDM   Final diagnoses:  Seizure ([place])  Clavicle fracture, right, closed, initial encounter    60 yo F with a chief complaint of right shoulder pain post seizure.  [name] is back to her baseline. We'll load her with Dilantin. Levels ordered. X-ray with distal clavicle fracture. We'll place a sling. ortho the follow-up.   I have discussed the diagnosis/risks/treatment options with the [name] and family and believe the pt to be eligible for discharge home to follow-up with neuro/ortho. We also discussed returning to the ED immediately if new or worsening sx occur. We discussed the sx which are most concerning (e.g., sudden worsening pain, fever, inability to tolerate by mouth, recurrent seizure activity) that necessitate immediate return. Medications administered to the [name] during their visit and any new prescriptions provided to the [name] are listed below.  Medications given during this visit Medications  phenytoin (DILANTIN) ER capsule 400 mg (400 mg Oral Given 10/31/15 2009)  fentaNYL (SUBLIMAZE) injection 100 mcg (100 mcg Intravenous Given 10/31/15 2008)    Discharge Medication List as of 10/31/2015  9:11 PM      The [name] appears reasonably screen and/or  stabilized for discharge and I doubt any other medical condition or other [facility] requiring further screening, evaluation, or treatment in the ED at this time prior to discharge.    Deno Etienne, DO 11/01/15 [id number]

## 2015-10-31 NOTE — Discharge Instructions (Signed)
Tylenol 1-2 tabs po q4h prn  Clavicle Fracture The clavicle, also called the collarbone, is the long bone that connects your shoulder to your rib cage. You can feel your collarbone at the top of your shoulders and rib cage. A clavicle fracture is a broken clavicle. It is a common injury that can happen at any age.  CAUSES Common causes of a clavicle fracture include:  A direct blow to your shoulder.  A car accident.  A fall, especially if you try to break your fall with an outstretched arm. RISK FACTORS You may be at increased risk if:  You are younger than 25 years or older than 50 years. Most clavicle fractures happen to people who are younger than 25 years.  You are a female.  You play contact sports. SIGNS AND SYMPTOMS A fractured clavicle is painful. It also makes it hard to move your arm. Other signs and symptoms may include:  A shoulder that drops downward and forward.  Pain when trying to lift your shoulder.  Bruising, swelling, and tenderness over your clavicle.  A grinding noise when you try to move your shoulder.  A bump over your clavicle. DIAGNOSIS Your health care provider can usually diagnose a clavicle fracture by asking about your injury and examining your shoulder and clavicle. He or she may take an X-ray to determine the position of your clavicle. TREATMENT Treatment depends on the position of your clavicle after the fracture:  If the broken ends of the bone are not out of place, your health care provider may put your arm in a sling or wrap a support bandage around your chest (figure-of-eight wrap).  If the broken ends of the bone are out of place, you may need surgery. Surgery may involve placing screws, pins, or plates to keep your clavicle stable while it heals. Healing may take about 3 months. When your health care provider thinks your fracture has healed enough, you may have to do physical therapy to regain normal movement and build up your arm  strength. HOME CARE INSTRUCTIONS   Apply ice to the injured area:  Put ice in a plastic bag.  Place a towel between your skin and the bag.  Leave the ice on for 20 minutes, 2-3 times a day.  If you have a wrap or splint:  Wear it all the time, and remove it only to take a bath or shower.  When you bathe or shower, keep your shoulder in the same position as when the sling or wrap is on.  Do not lift your arm.  If you have a figure-of-eight wrap:  Another person must tighten it every day.  It should be tight enough to hold your shoulders back.  Allow enough room to place your index finger between your body and the strap.  Loosen the wrap immediately if you feel numbness or tingling in your hands.  Only take medicines as directed by your health care provider.  Avoid activities that make the injury or pain worse for 4-6 weeks after surgery.  Keep all follow-up appointments. SEEK MEDICAL CARE IF:  Your medicine is not helping to relieve pain and swelling. SEEK IMMEDIATE MEDICAL CARE IF:  Your arm is numb, cold, or pale, even when the splint is loose. MAKE SURE YOU:   Understand these instructions.  Will watch your condition.  Will get help right away if you are not doing well or get worse.   This information is not intended to replace advice given  to you by your health care provider. Make sure you discuss any questions you have with your health care provider.   Document Released: 04/30/2005 Document Revised: 07/26/2013 Document Reviewed: 06/13/2013 Elsevier Interactive Patient Education [employer].

## 2015-11-01 ENCOUNTER — Emergency Department (HOSPITAL_COMMUNITY): Payer: Commercial Managed Care - HMO

## 2015-11-01 ENCOUNTER — Encounter (HOSPITAL_COMMUNITY): Payer: Self-pay | Admitting: Emergency Medicine

## 2015-11-01 ENCOUNTER — Inpatient Hospital Stay (HOSPITAL_COMMUNITY)
Admission: EM | Admit: 2015-11-01 | Discharge: 2015-11-04 | DRG: 101 | Disposition: A | Discharge status: Home Health | Payer: Commercial Managed Care - HMO | Attending: Internal Medicine | Admitting: Internal Medicine

## 2015-11-01 DIAGNOSIS — R569 Unspecified convulsions: Secondary | ICD-10-CM

## 2015-11-01 DIAGNOSIS — K219 Gastro-esophageal reflux disease without esophagitis: Secondary | ICD-10-CM | POA: Diagnosis present

## 2015-11-01 DIAGNOSIS — M549 Dorsalgia, unspecified: Secondary | ICD-10-CM | POA: Diagnosis present

## 2015-11-01 DIAGNOSIS — R269 Unspecified abnormalities of gait and mobility: Secondary | ICD-10-CM

## 2015-11-01 DIAGNOSIS — S42001A Fracture of unspecified part of right clavicle, initial encounter for closed fracture: Secondary | ICD-10-CM | POA: Diagnosis present

## 2015-11-01 DIAGNOSIS — J449 Chronic obstructive pulmonary disease, unspecified: Secondary | ICD-10-CM | POA: Diagnosis present

## 2015-11-01 DIAGNOSIS — Z882 Allergy status to sulfonamides status: Secondary | ICD-10-CM

## 2015-11-01 DIAGNOSIS — X58XXXA Exposure to other specified factors, initial encounter: Secondary | ICD-10-CM | POA: Diagnosis present

## 2015-11-01 DIAGNOSIS — F17210 Nicotine dependence, cigarettes, uncomplicated: Secondary | ICD-10-CM | POA: Diagnosis present

## 2015-11-01 DIAGNOSIS — G8929 Other chronic pain: Secondary | ICD-10-CM | POA: Diagnosis present

## 2015-11-01 DIAGNOSIS — Z8711 Personal history of peptic ulcer disease: Secondary | ICD-10-CM

## 2015-11-01 DIAGNOSIS — Z8679 Personal history of other diseases of the circulatory system: Secondary | ICD-10-CM

## 2015-11-01 DIAGNOSIS — Z8249 Family history of ischemic heart disease and other diseases of the circulatory system: Secondary | ICD-10-CM

## 2015-11-01 DIAGNOSIS — A419 Sepsis, unspecified organism: Secondary | ICD-10-CM | POA: Diagnosis present

## 2015-11-01 DIAGNOSIS — M542 Cervicalgia: Secondary | ICD-10-CM

## 2015-11-01 DIAGNOSIS — G40909 Epilepsy, unspecified, not intractable, without status epilepticus: Principal | ICD-10-CM

## 2015-11-01 DIAGNOSIS — J45909 Unspecified asthma, uncomplicated: Secondary | ICD-10-CM | POA: Diagnosis present

## 2015-11-01 DIAGNOSIS — N390 Urinary tract infection, site not specified: Secondary | ICD-10-CM | POA: Diagnosis present

## 2015-11-01 DIAGNOSIS — Z888 Allergy status to other drugs, medicaments and biological substances status: Secondary | ICD-10-CM

## 2015-11-01 DIAGNOSIS — Z79899 Other long term (current) drug therapy: Secondary | ICD-10-CM

## 2015-11-01 DIAGNOSIS — Z87442 Personal history of urinary calculi: Secondary | ICD-10-CM

## 2015-11-01 DIAGNOSIS — Z86718 Personal history of other venous thrombosis and embolism: Secondary | ICD-10-CM

## 2015-11-01 DIAGNOSIS — Z885 Allergy status to narcotic agent status: Secondary | ICD-10-CM

## 2015-11-01 DIAGNOSIS — Z8673 Personal history of transient ischemic attack (TIA), and cerebral infarction without residual deficits: Secondary | ICD-10-CM

## 2015-11-01 LAB — BASIC METABOLIC PANEL
ANION GAP: 9 (ref 5–15)
BUN: 13 mg/dL (ref 6–20)
CO2: 24 mmol/L (ref 22–32)
Calcium: 8.5 mg/dL — ABNORMAL LOW (ref 8.9–10.3)
Chloride: 105 mmol/L (ref 101–111)
Creatinine, Ser: 0.59 mg/dL (ref 0.44–1.00)
GFR calc Af Amer: >60 mL/min (ref >60)
GFR calc non Af Amer: >60 mL/min (ref >60)
GLUCOSE: 108 mg/dL — AB (ref 65–99)
Potassium: 3.8 mmol/L (ref 3.5–5.1)
Sodium: 138 mmol/L (ref 135–145)

## 2015-11-01 LAB — CBC WITH DIFFERENTIAL/PLATELET
BASOS ABS: 0 10*3/uL (ref 0.0–0.1)
BASOS PCT: 0 %
EOS PCT: 1 %
Eosinophils Absolute: 0 10*3/uL (ref 0.0–0.7)
HCT: 39.5 % (ref 36.0–46.0)
Hemoglobin: 13.3 g/dL (ref 12.0–15.0)
LYMPHS PCT: 13 %
Lymphs Abs: 0.6 10*3/uL — ABNORMAL LOW (ref 0.7–4.0)
MCH: 33.8 pg (ref 26.0–34.0)
MCHC: 33.7 g/dL (ref 30.0–36.0)
MCV: 100.5 fL — AB (ref 78.0–100.0)
MONO ABS: 0.2 10*3/uL (ref 0.1–1.0)
Monocytes Relative: 4 %
Neutro Abs: 3.6 10*3/uL (ref 1.7–7.7)
Neutrophils Relative %: 82 %
PLATELETS: 123 10*3/uL — AB (ref 150–400)
RBC: 3.93 MIL/uL (ref 3.87–5.11)
RDW: 12.8 % (ref 11.5–15.5)
WBC: 4.4 10*3/uL (ref 4.0–10.5)

## 2015-11-01 LAB — PHENYTOIN LEVEL, TOTAL: PHENYTOIN LVL: 26.1 ug/mL — AB (ref 10.0–20.0)

## 2015-11-01 MED ORDER — OXYCODONE-ACETAMINOPHEN 5-325 MG PO TABS
1.0000 | ORAL_TABLET | Freq: Once | ORAL | Status: AC
  Administered 2015-11-01: 1 via ORAL
  Filled 2015-11-01: qty 1

## 2015-11-01 NOTE — ED Notes (Addendum)
Patient presents from home via EMS for seizure like activity. Seen yesterday for same diagnosed with fracture in right clavicle. Daughter reports 3 seizures today, daughter is concerned about patient's Dilantin level (23.3 yesterday) and concerned if pain medication is interfering with dilantin. No urinary or bowel incontinence, no oral trauma, patient A&O x4.  Last VS: 115/60, 75hr (12 lead unremarkable) resp 12, 94% 4L

## 2015-11-01 NOTE — ED Provider Notes (Signed)
CSN: [id number]     Arrival date & time 11/01/15  1955 History   First MD Initiated Contact with Patient 11/01/15 2036     No chief complaint on file.   HPI   [name] is a 60 y.o. female with a PMH of DVT, seizures, CVA, COPD who presents to the ED with witnessed seizure. She states she was seen in the ED yesterday s/p seizure and was loaded with dilantin and found to have a right clavicle fracture. Her daughter is present at bedside, and notes she had another seizure today and was confused immediately following. She denies exacerbating or alleviating factors. She states she has not missed any doses of her seizure meds. She reports persistent right clavicle pain, though denies new injury. She denies hitting her head, LOC, numbness, weakness, paresthesia. She states she was in bed when her seizure occurred. She denies biting her tongue or loss of control of her bowel or blader.   Past Medical History  Diagnosis Date  . DVT (deep venous thrombosis) ([place])     "she's had several since 1990"  . Asthma   . Cerebral hemorrhage ([place]) 1989  . Chronic back pain   . DDD (degenerative disc disease) 06/12/2013  . Kidney stones   . Chronic bronchitis ([place])     "gets it q yr"  . History of blood transfusion     "when she was a baby"  . History of stomach ulcers   . GERD (gastroesophageal reflux disease)   . Headache     "usually around time when she's had a seizure"  . Seizures ([place])     "because of her brain surgery"  . Stroke [facility]) [id number]    family denies residual on 11/09/2014  . Tunnel vision     "since brain OR"  . COPD (chronic obstructive pulmonary disease) [facility])    Past Surgical History  Procedure Laterality Date  . Cesarean section  [id number]; 1987; 1989  . Brain surgery  1989    craniotomy with hematoma evacuation  . Hemorrhoid surgery    . Tubal ligation  1990's   Family History  Problem Relation Age of Onset  . Heart attack Mother   . Heart attack Father    Social History   Substance Use Topics  . Smoking status: Current Every Day Smoker -- 1.50 packs/day for 40 years    Types: Cigarettes  . Smokeless tobacco: Never Used  . Alcohol Use: No   OB History    No data available      Review of Systems  Constitutional: Negative for fever and chills.  Respiratory: Negative for shortness of breath.   Cardiovascular: Negative for chest pain.  Gastrointestinal: Negative for nausea, vomiting and abdominal pain.  Musculoskeletal: Positive for arthralgias.  Neurological: Negative for dizziness, syncope, weakness, light-headedness and numbness.  All other systems reviewed and are negative.     Allergies  Zonisamide; Codeine; Keppra; Pregabalin; and Olive oil  Home Medications   Prior to Admission medications   Medication Sig Start Date End Date Taking? Authorizing Provider  albuterol (PROVENTIL HFA;VENTOLIN HFA) 108 (90 Base) MCG/ACT inhaler Inhale 2 puffs into the lungs every 6 (six) hours as needed for wheezing or shortness of breath.   Yes Historical Provider, MD  feeding supplement, ENSURE ENLIVE, (ENSURE ENLIVE) LIQD Take 237 mLs by mouth 2 (two) times daily between meals. 04/01/15  Yes Maryann Mikhail, DO  lacosamide (VIMPAT) 200 MG TABS tablet Take 200 mg by mouth 2 (two)  times daily.   Yes Historical Provider, MD  oxyCODONE (ROXICODONE) 5 MG immediate release tablet Take 1 tablet (5 mg total) by mouth every 4 (four) hours as needed for severe pain. 10/31/15  Yes Deno Etienne, DO  phenytoin (DILANTIN) 100 MG ER capsule Take 2 capsules (200 mg total) by mouth 2 (two) times daily. Must get a Dilantin level checked by her primary care physician within a week Patient taking differently: Take 100-200 mg by mouth 2 (two) times daily. Take 100 mg every morning and 200 mg at bedtime. Must get a Dilantin level checked by her primary care physician within a week 01/19/15  Yes Thurnell Lose, MD  phenytoin (DILANTIN) 30 MG ER capsule Take 60 mg by mouth every morning.    Yes Historical Provider, MD  Vitamin D, Ergocalciferol, (DRISDOL) 50000 UNITS CAPS capsule Take 50,000 Units by mouth once a week. On Wednesdays 12/15/14  Yes Historical Provider, MD    BP 114/68 mmHg  Pulse 80  Temp(Src) 99.7 F (37.6 C) (Oral)  Resp 16  SpO2 90% Physical Exam  Constitutional: She is oriented to person, place, and time. She appears well-developed and well-nourished. No distress.  HENT:  Head: Normocephalic and atraumatic.  Right Ear: External ear normal.  Left Ear: External ear normal.  Nose: Nose normal.  Mouth/Throat: Uvula is midline, oropharynx is clear and moist and mucous membranes are normal.  Eyes: Conjunctivae, EOM and lids are normal. Pupils are equal, round, and reactive to light. Right eye exhibits no discharge. Left eye exhibits no discharge. No scleral icterus.  Neck: Normal range of motion. Neck supple.  Cardiovascular: Normal rate, regular rhythm, normal heart sounds, intact distal pulses and normal pulses.   Pulmonary/Chest: Effort normal and breath sounds normal. No respiratory distress. She has no wheezes. She has no rales.  Abdominal: Soft. Normal appearance and bowel sounds are normal. She exhibits no distension and no mass. There is no tenderness. There is no rigidity, no rebound and no guarding.  Musculoskeletal: Normal range of motion. She exhibits tenderness. She exhibits no edema.  TTP to right clavicle. Patient in shoulder sling. NVI.  Neurological: She is alert and oriented to person, place, and time. She has normal strength. No cranial nerve deficit or sensory deficit.  Gait ataxic. Speech fluent.  Skin: Skin is warm, dry and intact. No rash noted. She is not diaphoretic. No erythema. No pallor.  Psychiatric: She has a normal mood and affect. Her speech is normal and behavior is normal.  Nursing note and vitals reviewed.   ED Course  Procedures (including critical care time)  Labs Review Labs Reviewed  CBC WITH DIFFERENTIAL/PLATELET -  Abnormal; Notable for the following:    MCV 100.5 (*)    Platelets 123 (*)    Lymphs Abs 0.6 (*)    All other components within normal limits  BASIC METABOLIC PANEL - Abnormal; Notable for the following:    Glucose, Bld 108 (*)    Calcium 8.5 (*)    All other components within normal limits  PHENYTOIN LEVEL, TOTAL - Abnormal; Notable for the following:    Phenytoin Lvl 26.1 (*)    All other components within normal limits  URINALYSIS, ROUTINE W REFLEX MICROSCOPIC (NOT AT [facility]) - Abnormal; Notable for the following:    Color, Urine AMBER (*)    Bilirubin Urine SMALL (*)    Leukocytes, UA SMALL (*)    All other components within normal limits  URINE MICROSCOPIC-ADD ON - Abnormal; Notable for  the following:    Squamous Epithelial / LPF 0-5 (*)    Bacteria, UA FEW (*)    All other components within normal limits  URINE CULTURE    Imaging Review Dg Shoulder Right  10/31/2015  CLINICAL DATA:  60 year old female with seizure and right shoulder injury. EXAM: RIGHT SHOULDER - 2+ VIEW COMPARISON:  Radiograph dated 01/10/2015 FINDINGS: There is a displaced fracture of the distal aspect the right clavicle with approximately 6 mm superior displacement of the distal fracture fragment. The visualized portion of the humerus appears intact. No other fracture identified. The glenohumeral joint alignment is preserved. The soft tissues appear unremarkable. IMPRESSION: Mildly displaced fracture of the distal aspect of the right clavicle. Electronically Signed   By: Anner Crete M.D.   On: 10/31/2015 19:13   Dg Chest Portable 1 View  11/01/2015  CLINICAL DATA:  Acute onset of right shoulder pain. Status post seizure. Cough. Initial encounter. EXAM: PORTABLE CHEST 1 VIEW COMPARISON:  Chest radiograph performed 06/21/2015 FINDINGS: There is a mildly displaced oblique fracture through the distal third of the right clavicle, with mild shortening and superior displacement at the fracture site. The lungs are  well-aerated and clear. There is no evidence of focal opacification, pleural effusion or pneumothorax. The cardiomediastinal silhouette is within normal limits. No acute osseous abnormalities are seen. IMPRESSION: 1. Mildly displaced oblique fracture through the distal third of the right clavicle, with mild shortening and superior displacement at the fracture site. 2. Lungs remain grossly clear. Electronically Signed   By: Garald Balding M.D.   On: 11/01/2015 23:48   I have personally reviewed and evaluated these images and lab results as part of my medical decision-making.   EKG Interpretation None      MDM   Final diagnoses:  Seizure ([place])  UTI (lower urinary tract infection)     60 year old female presents with witnessed seizure, which occurred today prior to arrival. Was evaluated yesterday in the ED for seizure and noted to have a right clavicle fracture. She denies change in these symptoms. She denies hitting her head, LOC, numbness, weakness, paresthesia, bowel or bladder incontinence, tongue biting. She notes she was confused after, but feels like she is back to her baseline. Per record review, patient's dilantin level yesterday was 23, though she was loaded with dilantin.   Patient's temp initially 100.2. Vital signs stable. Head normocephalic and atraumatic. Heart RRR. Lungs clear to auscultation bilaterally. TTP to right clavicle. Patient is NVI. Abdomen soft, non-tender, non-distended. CNs II-XII grossly intact. Strength and sensation intact.  CBC negative for leukocytosis or anemia. BMP unremarkable. UA with small leukocytes, TNTC WBC, few bacteria. Urine culture ordered. Chest x-ray remarkable for mildly displaced oblique fracture through the distal third of the right clavicle, lungs remain grossly clear. Phenytoin level elevated at 26. On attempt to ambulate patient, her gait is ataxic, and she has difficulty walking independently.  Patient discussed with and seen by Dr.  Christy Gentles. Neurology consulted regarding acute management. Patient discussed with Dr. Leonel Ramsay, who will see the patient in the ED. Hospitalist consulted for admission. Spoke with Dr. Tamala Julian, patient to be admitted for further evaluation and management. Will give rocephin for UTI.  BP 114/68 mmHg  Pulse 80  Temp(Src) 99.7 F (37.6 C) (Oral)  Resp 16  SpO2 90%    Marella Chimes, PA-C 11/02/15 0133  Ripley Fraise, MD 11/03/15 [phone]

## 2015-11-02 ENCOUNTER — Encounter (HOSPITAL_COMMUNITY): Payer: Self-pay | Admitting: Rehabilitation

## 2015-11-02 ENCOUNTER — Observation Stay (HOSPITAL_COMMUNITY): Payer: Commercial Managed Care - HMO

## 2015-11-02 DIAGNOSIS — N390 Urinary tract infection, site not specified: Secondary | ICD-10-CM | POA: Diagnosis present

## 2015-11-02 DIAGNOSIS — R569 Unspecified convulsions: Secondary | ICD-10-CM

## 2015-11-02 DIAGNOSIS — R269 Unspecified abnormalities of gait and mobility: Secondary | ICD-10-CM

## 2015-11-02 DIAGNOSIS — A419 Sepsis, unspecified organism: Secondary | ICD-10-CM | POA: Diagnosis present

## 2015-11-02 DIAGNOSIS — J449 Chronic obstructive pulmonary disease, unspecified: Secondary | ICD-10-CM | POA: Diagnosis not present

## 2015-11-02 DIAGNOSIS — S42001A Fracture of unspecified part of right clavicle, initial encounter for closed fracture: Secondary | ICD-10-CM | POA: Diagnosis present

## 2015-11-02 DIAGNOSIS — M542 Cervicalgia: Secondary | ICD-10-CM | POA: Diagnosis not present

## 2015-11-02 LAB — URINE MICROSCOPIC-ADD ON

## 2015-11-02 LAB — URINALYSIS, ROUTINE W REFLEX MICROSCOPIC
Glucose, UA: NEGATIVE mg/dL
HGB URINE DIPSTICK: NEGATIVE
Ketones, ur: NEGATIVE mg/dL
NITRITE: NEGATIVE
PROTEIN: NEGATIVE mg/dL
SPECIFIC GRAVITY, URINE: 1.027 (ref 1.005–1.030)
pH: 6 (ref 5.0–8.0)

## 2015-11-02 LAB — PHENYTOIN LEVEL, TOTAL
PHENYTOIN LVL: 21.7 ug/mL — AB (ref 10.0–20.0)
Phenytoin Lvl: 20.4 ug/mL — ABNORMAL HIGH (ref 10.0–20.0)

## 2015-11-02 MED ORDER — IBUPROFEN 200 MG PO TABS
400.0000 mg | ORAL_TABLET | ORAL | Status: DC | PRN
  Administered 2015-11-02 – 2015-11-03 (×2): 400 mg via ORAL
  Filled 2015-11-02 (×2): qty 2

## 2015-11-02 MED ORDER — DEXTROSE 5 % IV SOLN
1.0000 g | INTRAVENOUS | Status: DC
  Administered 2015-11-02 – 2015-11-03 (×3): 1 g via INTRAVENOUS
  Filled 2015-11-02 (×4): qty 10

## 2015-11-02 MED ORDER — PHENYTOIN SODIUM EXTENDED 100 MG PO CAPS
100.0000 mg | ORAL_CAPSULE | Freq: Every day | ORAL | Status: DC
  Filled 2015-11-02: qty 1

## 2015-11-02 MED ORDER — PHENYTOIN SODIUM EXTENDED 100 MG PO CAPS
100.0000 mg | ORAL_CAPSULE | Freq: Two times a day (BID) | ORAL | Status: DC
  Filled 2015-11-02: qty 2

## 2015-11-02 MED ORDER — ENSURE ENLIVE PO LIQD
237.0000 mL | Freq: Two times a day (BID) | ORAL | Status: DC
  Administered 2015-11-02 – 2015-11-04 (×4): 237 mL via ORAL

## 2015-11-02 MED ORDER — ALBUTEROL SULFATE (2.5 MG/3ML) 0.083% IN NEBU: 2.5000 mg | INHALATION_SOLUTION | RESPIRATORY_TRACT | Status: DC | PRN

## 2015-11-02 MED ORDER — ENOXAPARIN SODIUM 40 MG/0.4ML ~~LOC~~ SOLN
40.0000 mg | SUBCUTANEOUS | Status: DC
  Administered 2015-11-02 – 2015-11-03 (×2): 40 mg via SUBCUTANEOUS
  Filled 2015-11-02 (×3): qty 0.4

## 2015-11-02 MED ORDER — PHENYTOIN SODIUM EXTENDED 100 MG PO CAPS
200.0000 mg | ORAL_CAPSULE | Freq: Every day | ORAL | Status: DC
  Administered 2015-11-02 – 2015-11-03 (×2): 200 mg via ORAL
  Filled 2015-11-02 (×2): qty 2

## 2015-11-02 MED ORDER — SODIUM CHLORIDE 0.9% FLUSH
3.0000 mL | Freq: Two times a day (BID) | INTRAVENOUS | Status: DC
  Administered 2015-11-02 – 2015-11-03 (×5): 3 mL via INTRAVENOUS

## 2015-11-02 MED ORDER — PHENYTOIN SODIUM EXTENDED 100 MG PO CAPS
100.0000 mg | ORAL_CAPSULE | Freq: Every day | ORAL | Status: DC
  Administered 2015-11-03 – 2015-11-04 (×2): 100 mg via ORAL
  Filled 2015-11-02 (×2): qty 1

## 2015-11-02 MED ORDER — ONDANSETRON HCL 4 MG PO TABS: 4.0000 mg | ORAL_TABLET | Freq: Four times a day (QID) | ORAL | Status: DC | PRN

## 2015-11-02 MED ORDER — ONDANSETRON HCL 4 MG/2ML IJ SOLN: 4.0000 mg | Freq: Four times a day (QID) | INTRAMUSCULAR | Status: DC | PRN

## 2015-11-02 MED ORDER — OXYCODONE HCL 5 MG PO TABS
5.0000 mg | ORAL_TABLET | ORAL | Status: DC | PRN
  Administered 2015-11-02 – 2015-11-04 (×7): 5 mg via ORAL
  Filled 2015-11-02 (×7): qty 1

## 2015-11-02 MED ORDER — PANTOPRAZOLE SODIUM 40 MG PO TBEC
40.0000 mg | DELAYED_RELEASE_TABLET | Freq: Two times a day (BID) | ORAL | Status: DC
  Administered 2015-11-02 – 2015-11-04 (×5): 40 mg via ORAL
  Filled 2015-11-02 (×5): qty 1

## 2015-11-02 MED ORDER — MORPHINE SULFATE (PF) 4 MG/ML IV SOLN
4.0000 mg | Freq: Once | INTRAVENOUS | Status: AC
  Administered 2015-11-02: 4 mg via INTRAVENOUS
  Filled 2015-11-02: qty 1

## 2015-11-02 MED ORDER — SODIUM CHLORIDE 0.9 % IV SOLN
200.0000 mg | Freq: Two times a day (BID) | INTRAVENOUS | Status: DC
  Administered 2015-11-02 – 2015-11-03 (×4): 200 mg via INTRAVENOUS
  Filled 2015-11-02 (×7): qty 20

## 2015-11-02 NOTE — Evaluation (Signed)
Physical Therapy Evaluation Patient Details Name: [name] MRN: [id number] DOB: 02-15-1956 Today's Date: 11/02/2015   History of Present Illness  60 yo female admitted with seizure, R clavicle fx sustained after fall during seizure episode. Seizures 3/29, 3/30.  Hx of CVA, DVT, cerebral hemorrhage, COPD, Sz.   Clinical Impression  On eval, pt required Min assist for mobility. Mobility limited by R UE pain with any activity. Pt is unsteady during gait. Recommend 24 hour care and HHPT follow up. If pt will not have assistance at home, she may need ST [facility].     Follow Up Recommendations Home health PT;Supervision/Assistance - 24 hour (if assist not availabe at home, may need SNF)    Equipment Recommendations       Recommendations for Other Services       Precautions / Restrictions Precautions Precautions: Fall Required Braces or Orthoses: Sling (R UE) Restrictions Weight Bearing Restrictions: Yes RUE Weight Bearing: Non weight bearing Other Position/Activity Restrictions: sling R UE-displaced fx distal R clavicle      Mobility  Bed Mobility Overal bed mobility: Needs Assistance Bed Mobility: Sit to Supine       Sit to supine: Min assist   General bed mobility comments: for upper body.   Transfers Overall transfer level: Needs assistance Equipment used: 1 person hand held assist Transfers: Sit to/from Stand Sit to Stand: Min assist         General transfer comment: Assist to rise, stabilize, control descent. VCs hand placement.   Ambulation/Gait Ambulation/Gait assistance: Min assist Ambulation Distance (Feet): 4 Feet Assistive device: 1 person hand held assist Gait Pattern/deviations: Step-through pattern;Decreased stride length     General Gait Details: Unsteady. Assist needed to stabilize. Pt walked short distance from [facility] back to bed.   Stairs            Wheelchair Mobility    Modified Rankin (Stroke Patients Only)       Balance  Overall balance assessment: Needs assistance;History of Falls         Standing balance support: Single extremity supported;During functional activity Standing balance-Leahy Scale: Poor                               Pertinent Vitals/Pain Pain Assessment: Faces Faces Pain Scale: Hurts whole lot Pain Location: R UE with any activity Pain Descriptors / Indicators: Sharp;Sore Pain Intervention(s): Limited activity within patient's tolerance;Repositioned    Home Living Family/patient expects to be discharged to:: Private residence Living Arrangements: Children Available Help at Discharge: Family;Available PRN/intermittently Type of Home: House Home Access: Stairs to enter Entrance Stairs-Rails: None Entrance Stairs-Number of Steps: 2 Home Layout: One level Home Equipment: None      Prior Function Level of Independence: Independent               Hand Dominance        Extremity/Trunk Assessment   Upper Extremity Assessment: RUE deficits/detail   RUE: Unable to fully assess due to immobilization       Lower Extremity Assessment: Generalized weakness      Cervical / Trunk Assessment: Normal  Communication   Communication: No difficulties  Cognition Arousal/Alertness: Awake/alert Behavior During Therapy: WFL for tasks assessed/performed Overall Cognitive Status: Within Functional Limits for tasks assessed                      General Comments  Exercises        Assessment/Plan    PT Assessment Patient needs continued PT services  PT Diagnosis Difficulty walking;Generalized weakness;Acute pain   PT Problem List Decreased activity tolerance;Decreased balance;Decreased mobility;Pain;Decreased knowledge of use of DME  PT Treatment Interventions DME instruction;Gait training;Functional mobility training;Therapeutic activities;Patient/family education;Balance training;Therapeutic exercise   PT Goals (Current goals can be found in the  Care Plan section) Acute Rehab PT Goals Patient Stated Goal: less pain PT Goal Formulation: With patient Time For Goal Achievement: 11/16/15 Potential to Achieve Goals: Good    Frequency Min 3X/week   Barriers to discharge        Co-evaluation               End of Session Equipment Utilized During Treatment: Gait belt Activity Tolerance: Patient limited by pain Patient left: in bed;with call bell/phone within reach;with bed alarm set;with nursing/sitter in room      Functional Assessment Tool Used: clinical judgement Functional Limitation: Mobility: Walking and moving around Mobility: Walking and Moving Around Current Status [id number]): At least 20 percent but less than 40 percent impaired, limited or restricted Mobility: Walking and Moving Around Goal Status [phone]): At least 1 percent but less than 20 percent impaired, limited or restricted    Time: 1032-1040 PT Time Calculation (min) (ACUTE ONLY): 8 min   Charges:   PT Evaluation $PT Eval Low Complexity: 1 Procedure     PT G Codes:   PT G-Codes **NOT FOR INPATIENT CLASS** Functional Assessment Tool Used: clinical judgement Functional Limitation: Mobility: Walking and moving around Mobility: Walking and Moving Around Current Status [id number]): At least 20 percent but less than 40 percent impaired, limited or restricted Mobility: Walking and Moving Around Goal Status [phone]): At least 1 percent but less than 20 percent impaired, limited or restricted    Weston Anna, MPT Pager: [phone]

## 2015-11-02 NOTE — Progress Notes (Signed)
CSW reviewed PT evaluation recommending HH vs. SNF at discharge. CSW & RNCM, Juliann Pulse spoke with patient re: discharge plans. Patient states that she had been to [facility] in the past, but she lives with her daughter & son-in-law and plans to return there at discharge.   No further CSW needs identified - CSW signing off.   Raynaldo Opitz, [facility] Clinical Social Worker cell #: [phone]

## 2015-11-02 NOTE — Care Management Obs Status (Signed)
[place] NOTIFICATION   Patient Details  Name: KENZEY KREIS MRN: [id number] Date of Birth: 06/10/1956   Medicare Observation Status Notification Given:  Yes    MahabirJuliann Pulse, RN 11/02/2015, 10:02 AM

## 2015-11-02 NOTE — H&P (Signed)
[facility] History and Physical  [name] [id number] DOB: 04-27-1956 DOA: 11/01/2015  Referring physician: ED PCP: [name], MD   Chief Complaint: Seizure   HPI:   [name] is a 60 year old female with a past medical history significant for seizure disorder, CVA, COPD, DVT; who presented after witnessed seizure by her daughter yesterday. Patient had been seen in the ED 2 days ago after having a seizure episode and at time was found to have a right clavicle fracture. She was evaluated and loaded with Dilantin and discharged home. The Dilantin level at that time was 23.3. Patient after getting home apparently had another seizure and was noted to be confused afterward. There is no note of tongue biting, loss of bowel or bladder. The patient's daughter states that her mother has been complaining of significantly pain of her right clavicle as well as her head and neck. Current pain medications are not relieving symptoms. Denies any focal weakness, changes in vision, increased lethargy, worsening of shortness of breath, nausea, vomiting, fever, or chills. The patient had reportedly been taking all seizure medications as advised.   On admission tonight her Dilantin level was elevated to 26.1. Lab work otherwise appear to be unremarkable. Urinalysis was positive for few bacteria, small leukocyte estrace, 0-5 squamous epithelial cells, and too numerous to count WBCs. Neurology was consulted due to the elevated Dilantin levels and note of an ataxic while ambulating the patient.    Review of Systems  Constitutional: Negative for chills and weight loss.  HENT: Negative for hearing loss.   Eyes: Negative for photophobia and pain.  Respiratory: Negative for sputum production and shortness of breath.   Cardiovascular: Negative for palpitations and leg swelling.  Gastrointestinal: Positive for nausea and vomiting.  Genitourinary: Negative for urgency and frequency.  Musculoskeletal:  Positive for back pain, joint pain and neck pain.  Skin: Negative for itching and rash.  Neurological: Positive for seizures.  Endo/Heme/Allergies: Negative for environmental allergies and polydipsia.  Psychiatric/Behavioral: Negative for substance abuse. The patient has insomnia.        Past Medical History  Diagnosis Date  . DVT (deep venous thrombosis) ([place])     "she's had several since 1990"  . Asthma   . Cerebral hemorrhage ([place]) 1989  . Chronic back pain   . DDD (degenerative disc disease) 06/12/2013  . Kidney stones   . Chronic bronchitis ([place])     "gets it q yr"  . History of blood transfusion     "when she was a baby"  . History of stomach ulcers   . GERD (gastroesophageal reflux disease)   . Headache     "usually around time when she's had a seizure"  . Seizures ([place])     "because of her brain surgery"  . Stroke [facility]) [phone]    family denies residual on 11/09/2014  . Tunnel vision     "since brain OR"  . COPD (chronic obstructive pulmonary disease) [facility])      Past Surgical History  Procedure Laterality Date  . Cesarean section  [id number]; 1987; 1989  . Brain surgery  1989    craniotomy with hematoma evacuation  . Hemorrhoid surgery    . Tubal ligation  1990's      Social History:  reports that she has been smoking Cigarettes.  She has a 60 pack-year smoking history. She has never used smokeless tobacco. She reports that she does not drink alcohol or use illicit drugs.   Allergies  Allergen  Reactions  . Zonisamide Other (See Comments)    Numbness and tingling over whole body  . Codeine Nausea And Vomiting  . Keppra [Levetiracetam] Other (See Comments)    Causes seizures.   . Pregabalin Nausea And Vomiting  . Olive Oil Rash    Family History  Problem Relation Age of Onset  . Heart attack Mother   . Heart attack Father         Prior to Admission medications   Medication Sig Start Date End Date Taking? Authorizing Provider  albuterol (PROVENTIL  HFA;VENTOLIN HFA) 108 (90 Base) MCG/ACT inhaler Inhale 2 puffs into the lungs every 6 (six) hours as needed for wheezing or shortness of breath.   Yes Historical Provider, MD  feeding supplement, ENSURE ENLIVE, (ENSURE ENLIVE) LIQD Take 237 mLs by mouth 2 (two) times daily between meals. 04/01/15  Yes Maryann Mikhail, DO  lacosamide (VIMPAT) 200 MG TABS tablet Take 200 mg by mouth 2 (two) times daily.   Yes Historical Provider, MD  oxyCODONE (ROXICODONE) 5 MG immediate release tablet Take 1 tablet (5 mg total) by mouth every 4 (four) hours as needed for severe pain. 10/31/15  Yes Deno Etienne, DO  phenytoin (DILANTIN) 100 MG ER capsule Take 2 capsules (200 mg total) by mouth 2 (two) times daily. Must get a Dilantin level checked by her primary care physician within a week Patient taking differently: Take 100-200 mg by mouth 2 (two) times daily. Take 100 mg every morning and 200 mg at bedtime. Must get a Dilantin level checked by her primary care physician within a week 01/19/15  Yes Thurnell Lose, MD  phenytoin (DILANTIN) 30 MG ER capsule Take 60 mg by mouth every morning.   Yes Historical Provider, MD  Vitamin D, Ergocalciferol, (DRISDOL) 50000 UNITS CAPS capsule Take 50,000 Units by mouth once a week. On Wednesdays 12/15/14  Yes Historical Provider, MD     Physical Exam: Filed Vitals:   11/01/15 2202 11/01/15 2238 11/01/15 2323 11/02/15 0135  BP: 123/62  114/68 100/65  Pulse: 79  80 98  Temp:  99.7 F (37.6 C)    TempSrc:  Oral    Resp: 16  16 21   SpO2: 90%  90% 90%     Constitutional: Vital signs reviewed. Patient appears underweight and is in moderate distress due to pain. Head: Normocephalic and atraumatic  Ear: TM normal bilaterally  Mouth: no erythema or exudates, MMM  Eyes: PERRL, EOMI, conjunctivae normal, No scleral icterus.  Neck: Decreased range of motion movement secondary to pain Cardiovascular: RRR, S1 normal, S2 normal, no MRG, pulses symmetric and intact bilaterally   Pulmonary/Chest: CTAB, no wheezes, rales, or rhonchi  Abdominal: Soft. Non-tender, non-distended, bowel sounds are normal, no masses, organomegaly, or guarding present.  GU: no CVA tenderness Musculoskeletal: Tenderness to palpation over along the right shoulder and clavicle. Patient's arm currently in a sling Ext: no edema and no cyanosis, pulses palpable bilaterally (DP and PT)  Hematology: no cervical, inginal, or axillary adenopathy.  Neurological: A&O x3, Strenght is normal and symmetric bilaterally, cranial nerve II-XII are grossly intact, no focal motor deficit, sensory intact to light touch bilaterally.  Skin: Warm, dry and intact. No rash, cyanosis, or clubbing.  Psychiatric: Normal mood and affect. speech and behavior is normal. Judgment and thought content normal. Cognition and memory are normal.      Data Review   Micro Results No results found for this or any previous visit (from the past 240 hour(s)).  Radiology  Reports Dg Shoulder Right  10/31/2015  CLINICAL DATA:  60 year old female with seizure and right shoulder injury. EXAM: RIGHT SHOULDER - 2+ VIEW COMPARISON:  Radiograph dated 01/10/2015 FINDINGS: There is a displaced fracture of the distal aspect the right clavicle with approximately 6 mm superior displacement of the distal fracture fragment. The visualized portion of the humerus appears intact. No other fracture identified. The glenohumeral joint alignment is preserved. The soft tissues appear unremarkable. IMPRESSION: Mildly displaced fracture of the distal aspect of the right clavicle. Electronically Signed   By: Anner Crete M.D.   On: 10/31/2015 19:13   Dg Chest Portable 1 View  11/01/2015  CLINICAL DATA:  Acute onset of right shoulder pain. Status post seizure. Cough. Initial encounter. EXAM: PORTABLE CHEST 1 VIEW COMPARISON:  Chest radiograph performed 06/21/2015 FINDINGS: There is a mildly displaced oblique fracture through the distal third of the right  clavicle, with mild shortening and superior displacement at the fracture site. The lungs are well-aerated and clear. There is no evidence of focal opacification, pleural effusion or pneumothorax. The cardiomediastinal silhouette is within normal limits. No acute osseous abnormalities are seen. IMPRESSION: 1. Mildly displaced oblique fracture through the distal third of the right clavicle, with mild shortening and superior displacement at the fracture site. 2. Lungs remain grossly clear. Electronically Signed   By: Garald Balding M.D.   On: 11/01/2015 23:48     CBC  Recent Labs Lab 11/01/15 2118  WBC 4.4  HGB 13.3  HCT 39.5  PLT 123*  MCV 100.5*  MCH 33.8  MCHC 33.7  RDW 12.8  LYMPHSABS 0.6*  MONOABS 0.2  EOSABS 0.0  BASOSABS 0.0    Chemistries   Recent Labs Lab 11/01/15 2118  NA 138  K 3.8  CL 105  CO2 24  GLUCOSE 108*  BUN 13  CREATININE 0.59  CALCIUM 8.5*   ------------------------------------------------------------------------------------------------------------------ CrCl cannot be calculated (Unknown ideal weight.). ------------------------------------------------------------------------------------------------------------------ No results for input(s): HGBA1C in the last 72 hours. ------------------------------------------------------------------------------------------------------------------ No results for input(s): CHOL, HDL, LDLCALC, TRIG, CHOLHDL, LDLDIRECT in the last 72 hours. ------------------------------------------------------------------------------------------------------------------ No results for input(s): TSH, T4TOTAL, T3FREE, THYROIDAB in the last 72 hours.  Invalid input(s): FREET3 ------------------------------------------------------------------------------------------------------------------ No results for input(s): VITAMINB12, FOLATE, FERRITIN, TIBC, IRON, RETICCTPCT in the last 72 hours.  Coagulation profile No results for input(s): INR,  PROTIME in the last 168 hours.  No results for input(s): DDIMER in the last 72 hours.  Cardiac Enzymes No results for input(s): CKMB, TROPONINI, MYOGLOBIN in the last 168 hours.  Invalid input(s): CK ------------------------------------------------------------------------------------------------------------------ Invalid input(s): POCBNP   CBG: No results for input(s): GLUCAP in the last 168 hours.     EKG: Independently reviewed. Normal sinus rhythm  Assessment/Plan Seizures with history of seizure disorder: Acute. Question if patient's recent pain medications versus underlying infection as the cause of recurrent seizures despite being therapeutic on medications. - Admit to a telemetry bed - Seizure precautions  - Discussed with neurology who recommended:  - Continuing Dilantin 100 mg in a.m. and 200 mg every evening if repeat level therapeutic  - Continue Vimpat 200 mg twice a day - Follow with neurology for further recommendations  Gait disturbance - Physical therapy to eval and treat her safety  Right clavicle fracture: Patient continues to complain of pain of the right clavicle area as well as neck and head. Review of previous imaging studies show no - Check cervical spine x-ray  - Continue oxycodone prn  Possible urinary tract infection: Acute. Patient with possible  urinary tract infection as to the cause seizures despite taking medications as advised. - Given 1 dose of Rocephin in the ED - Follow-up urine culture    COPD: Stable   Code Status:   full Family Communication: bedside Disposition Plan: admit   Total time spent 55 minutes.Greater than 50% of this time was spent in counseling, explanation of diagnosis, planning of further management, and coordination of care  [facility] Pager [phone]  If 7PM-7AM, please contact night-coverage [email or web address] Password [facility] 11/02/2015, 2:46 AM

## 2015-11-02 NOTE — Consult Note (Signed)
Neurology Consultation Reason for Consult: Seizure Referring Physician: Patient  CC: Seizure  History is obtained from: Patient  HPI: [name] is a 60 y.o. female with a history of seizures since and ICH 28 years ago who has been managed on multiple medications in the past who presents with breakthrough seizures. She currently takes Dilantin as well as lacosamide. She takes 160 mg every morning of Dilantin and 200 mg daily at bedtime. She also takes lacosamide 200 mg twice a day.  She had a seizure 3/29 , and then another one 3/30. She injured her right arm during her seizure on 3/29. When being evaluated in the emergency room, was noted that she was ataxic and Dilantin levels were checked which revealed a level of 26.   Due to her inability to safely ambulate she is being admitted for observation.    ROS: A 14 point ROS was performed and is negative except as noted in the HPI.   Past Medical History  Diagnosis Date  . DVT (deep venous thrombosis) ([place])     "she's had several since 1990"  . Asthma   . Cerebral hemorrhage ([place]) 1989  . Chronic back pain   . DDD (degenerative disc disease) 06/12/2013  . Kidney stones   . Chronic bronchitis ([place])     "gets it q yr"  . History of blood transfusion     "when she was a baby"  . History of stomach ulcers   . GERD (gastroesophageal reflux disease)   . Headache     "usually around time when she's had a seizure"  . Seizures ([place])     "because of her brain surgery"  . Stroke [facility]) [phone]    family denies residual on 11/09/2014  . Tunnel vision     "since brain OR"  . COPD (chronic obstructive pulmonary disease) ([facility])      Family History  Problem Relation Age of Onset  . Heart attack Mother   . Heart attack Father      Social History:  reports that she has been smoking Cigarettes.  She has a 60 pack-year smoking history. She has never used smokeless tobacco. She reports that she does not drink alcohol or use illicit  drugs.   Exam: Current vital signs: BP 100/65 mmHg  Pulse 98  Temp(Src) 99.7 F (37.6 C) (Oral)  Resp 21  SpO2 90% Vital signs in last 24 hours: Temp:  [99.7 F (37.6 C)-100.2 F (37.9 C)] 99.7 F (37.6 C) (03/30 2238) Pulse Rate:  [76-98] 98 (03/31 0135) Resp:  [15-21] 21 (03/31 0135) BP: (100-123)/(62-68) 100/65 mmHg (03/31 0135) SpO2:  [89 %-91 %] 90 % (03/31 0135)   Physical Exam  Constitutional: Appears well-developed and well-nourished.  Psych: Affect appropriate to situation Eyes: No scleral injection HENT: No OP obstrucion Head: Normocephalic.  Cardiovascular: Normal rate and regular rhythm.  Respiratory: Effort normal and breath sounds normal to anterior ascultation GI: Soft.  No distension. There is no tenderness.  Skin: WDI  Neuro: Mental Status: Patient is awake, alert, oriented to person, place, month, year, and situation. Patient is able to give a clear and coherent history. No signs of aphasia or neglect Cranial Nerves: II: She has a dense left hemianopia Pupils are equal, round, and reactive to light.   III,IV, VI: EOMI without ptosis or diploplia.  V: Facial sensation is symmetric to temperature VII: Facial movement is symmetric.  VIII: hearing is intact to voice X: Uvula elevates symmetrically XI: Shoulder shrug  is symmetric. XII: tongue is midline without atrophy or fasciculations.  Motor: Tone is normal. Bulk is normal. 5/5 strength was present in all four extremities.  Sensory: Sensation is symmetric to light touch and temperature in the arms and legs. Cerebellar: FNF  intact bilaterally    I have reviewed labs in epic and the results pertinent to this consultation are: Dilantin 26.1 UA microscopic reveals numerous white blood cells  Impression: 60 year old female with long-standing history of seizures who has never been completely controlled has breakthrough seizures in the setting of urinary tract infection. I suspect that the UTI has  lowered her seizure threshold causing breakthrough. She will need to have her Dilantin reduced.   She has been in discussions regarding epilepsy surgery or vagal nerve stimulator with her primary neurologist. Given her extensive history I will not favor adding an agent at this time, but rather have her follow-up with her primary neurologist for further discussion of AED management versus surgical management of her epilepsy.  Recommendations: 1) hold the Dilantin for now, recheck in the morning and if therapeutic rather than supratherapeutic I would restart at 100 mg in the morning and 200 mg in the evening (this is decreased from 160 in the morning and 200 in the evening) 2) continue Vimpat 200 mg twice a day 3) can be discharged once able to ambulate safely 4) please call if any further seizures occur, or there are other questions or concerns. Neurology will see if needed.  Roland Rack, MD [facility] [phone]  If 7pm- 7am, please page neurology on call as listed in [place].

## 2015-11-02 NOTE — Progress Notes (Signed)
[facility] PROGRESS NOTE    Progress Note   [name] [id number] DOB: July 15, 1956 DOA: 11/01/2015 PCP: [name], MD   Brief Narrative:   [name] is an 60 y.o. female Past medical history significant for seizures CVA COPD who comes in she presented with a witnessed seizure by her daughter today prior to admission.  Assessment/Plan:   Seizures ([place]): On admission she was found to have a supratherapeutic daily and levels of Dilantin levels held for 24 hours. She did have what seems to be like a UTI, She was also started on oral narcotics which could be contributing to her seizures. Neurology was consulted,, Who recommended to hold Dilantin in the morning and continue lower dose.  COPD (chronic obstructive pulmonary disease) ([place]): Stable.  Gait disturbance Likely due to supratherapeutic they likely level And lack of peripheral vision. Hold her Dilantin this morning and recheck in the Complete metabolic panel in morning. To correct  her dilantin level for her albumin.  Right clavicle fracture: Sling in place, continue narcotics added ibuprofen and Protonix for pain control. Has a follow-up appointment with her orthopedic physician.  Possible Infection of urinary tract: Agree with IV Rocephin, awaiting urine cultures has remained afebrile.    DVT Prophylaxis - Lovenox ordered.  Family Communication: none Disposition Plan: Home in am Code Status:     Code Status Orders        Start     Ordered   11/02/15 0242  Full code   Continuous     11/02/15 0245    Code Status History    Date Active Date Inactive Code Status Order ID Comments User Context   03/31/2015 12:32 AM 04/01/2015  4:15 PM Full Code [id number]  Ivor Costa, MD Inpatient   01/18/2015  2:46 AM 01/19/2015  5:32 PM Full Code [id number]  Ivor Costa, MD Inpatient   01/10/2015 12:24 AM 01/11/2015  3:58 PM Full Code [id number]  Allyne Gee, MD ED   11/09/2014  9:00 PM 11/10/2014  8:19 PM Full Code [id number]   Deneise Lever, MD ED   05/15/2014  2:00 AM 05/15/2014  3:44 PM Full Code [id number]  Ivor Costa, MD Inpatient   06/17/2013  2:17 PM 06/20/2013  6:43 PM Full Code [id number]  Orson Eva, MD Inpatient   06/11/2013  8:07 PM 06/13/2013  6:58 PM Full Code [id number]  Jonetta Osgood, MD ED   04/15/2013 12:40 AM 04/15/2013  1:32 PM Full Code [id number]  Caren Griffins, MD ED   01/23/2013 12:30 AM 01/23/2013  6:42 PM Full Code [id number]  Theressa Millard, MD ED   12/13/2012  7:27 AM 12/19/2012  7:04 PM Full Code [id number]  Marzetta Board, MD ED   02/28/2012  2:10 AM 03/05/2012  7:58 PM Full Code [id number]  Almyra Free, RN Inpatient        IV Access:    Peripheral IV   Procedures and diagnostic studies:   Dg Cervical Spine 2 Or 3 Views  11/02/2015  CLINICAL DATA:  60 year old female with neck pain EXAM: CERVICAL SPINE - 2-3 VIEW COMPARISON:  None. FINDINGS: There is no acute fracture or subluxation of the cervical spine. There is degenerative changes most prominent at C5-C6 and C6-C7. The visualized spinous processes and odontoid appear intact. There is anatomic alignment of the lateral masses of the C1-C2. The soft tissues appear unremarkable. IMPRESSION: No acute fracture or subluxation. Electronically Signed   By: Anner Crete M.D.   On:  11/02/2015 03:35   Dg Shoulder Right  10/31/2015  CLINICAL DATA:  60 year old female with seizure and right shoulder injury. EXAM: RIGHT SHOULDER - 2+ VIEW COMPARISON:  Radiograph dated 01/10/2015 FINDINGS: There is a displaced fracture of the distal aspect the right clavicle with approximately 6 mm superior displacement of the distal fracture fragment. The visualized portion of the humerus appears intact. No other fracture identified. The glenohumeral joint alignment is preserved. The soft tissues appear unremarkable. IMPRESSION: Mildly displaced fracture of the distal aspect of the right clavicle. Electronically Signed   By: Anner Crete M.D.   On: 10/31/2015 19:13    Dg Chest Portable 1 View  11/01/2015  CLINICAL DATA:  Acute onset of right shoulder pain. Status post seizure. Cough. Initial encounter. EXAM: PORTABLE CHEST 1 VIEW COMPARISON:  Chest radiograph performed 06/21/2015 FINDINGS: There is a mildly displaced oblique fracture through the distal third of the right clavicle, with mild shortening and superior displacement at the fracture site. The lungs are well-aerated and clear. There is no evidence of focal opacification, pleural effusion or pneumothorax. The cardiomediastinal silhouette is within normal limits. No acute osseous abnormalities are seen. IMPRESSION: 1. Mildly displaced oblique fracture through the distal third of the right clavicle, with mild shortening and superior displacement at the fracture site. 2. Lungs remain grossly clear. Electronically Signed   By: Garald Balding M.D.   On: 11/01/2015 23:48     Medical Consultants:    None.  Anti-Infectives:   Anti-infectives    Start     Dose/Rate Route Frequency Ordered Stop   11/02/15 0130  cefTRIAXone (ROCEPHIN) 1 g in dextrose 5 % 50 mL IVPB     1 g 100 mL/hr over 30 Minutes Intravenous Every 24 hours 11/02/15 0126        Subjective:    [name] she relates she continues to have shoulder pain.  Objective:    Filed Vitals:   11/01/15 2238 11/01/15 2323 11/02/15 0135 11/02/15 0338  BP:  114/68 100/65 111/58  Pulse:  80 98 79  Temp: 99.7 F (37.6 C)   98.9 F (37.2 C)  TempSrc: Oral   Oral  Resp:  16 21 20   Height:    5\' 7"  (1.702 m)  Weight:    56.2 kg (123 lb 14.4 oz)  SpO2:  90% 90% 97%    Intake/Output Summary (Last 24 hours) at 11/02/15 0920 Last data filed at 11/02/15 0445  Gross per 24 hour  Intake      3 ml  Output      0 ml  Net      3 ml   Filed Weights   11/02/15 0338  Weight: 56.2 kg (123 lb 14.4 oz)    Exam: Gen:  NAD Cardiovascular:  RRR. Chest and lungs:   CTAB Abdomen:  Abdomen soft, NT/ND, + BS Extremities:  No edema   Data  Reviewed:    Labs: Basic Metabolic Panel:  Recent Labs Lab 11/01/15 2118  NA 138  K 3.8  CL 105  CO2 24  GLUCOSE 108*  BUN 13  CREATININE 0.59  CALCIUM 8.5*   GFR Estimated Creatinine Clearance: 68.8 mL/min (by C-G formula based on Cr of 0.59). Liver Function Tests: No results for input(s): AST, ALT, ALKPHOS, BILITOT, PROT, ALBUMIN in the last 168 hours. No results for input(s): LIPASE, AMYLASE in the last 168 hours. No results for input(s): AMMONIA in the last 168 hours. Coagulation profile No results for input(s): INR, PROTIME  in the last 168 hours.  CBC:  Recent Labs Lab 11/01/15 2118  WBC 4.4  NEUTROABS 3.6  HGB 13.3  HCT 39.5  MCV 100.5*  PLT 123*   Cardiac Enzymes: No results for input(s): CKTOTAL, CKMB, CKMBINDEX, TROPONINI in the last 168 hours. BNP (last 3 results) No results for input(s): PROBNP in the last 8760 hours. CBG: No results for input(s): GLUCAP in the last 168 hours. D-Dimer: No results for input(s): DDIMER in the last 72 hours. Hgb A1c: No results for input(s): HGBA1C in the last 72 hours. Lipid Profile: No results for input(s): CHOL, HDL, LDLCALC, TRIG, CHOLHDL, LDLDIRECT in the last 72 hours. Thyroid function studies: No results for input(s): TSH, T4TOTAL, T3FREE, THYROIDAB in the last 72 hours.  Invalid input(s): FREET3 Anemia work up: No results for input(s): VITAMINB12, FOLATE, FERRITIN, TIBC, IRON, RETICCTPCT in the last 72 hours. Sepsis Labs:  Recent Labs Lab 11/01/15 2118  WBC 4.4   Microbiology No results found for this or any previous visit (from the past 240 hour(s)).   Medications:   . cefTRIAXone (ROCEPHIN)  IV  1 g Intravenous Q24H  . enoxaparin (LOVENOX) injection  40 mg Subcutaneous Q24H  . feeding supplement (ENSURE ENLIVE)  237 mL Oral BID BM  . lacosamide (VIMPAT) IV  200 mg Intravenous Q12H  . [START ON 11/03/2015] phenytoin  100 mg Oral Daily  . phenytoin  200 mg Oral QHS  . sodium chloride flush  3  mL Intravenous Q12H   Continuous Infusions:   Time spent: 25 min     Charlynne Cousins  [facility] Pager [phone]  *Please refer to [email or web address], password [email or web address] to get updated schedule on who will round on this patient, as hospitalists switch teams weekly. If 7PM-7AM, please contact night-coverage at [email or web address], password [email or web address] for any overnight needs.  11/02/2015, 9:20 AM

## 2015-11-02 NOTE — Care Management Note (Signed)
Case Management Note  Patient Details  Name: [name] MRN: [id number] Date of Birth: Dec 26, 1955  Subjective/Objective:  Spoke to patient about d/c jplans-declines SNF. [facility] chosen for [facility] [employer] aware-HHRN/PT/OT/aide-await [place] orders, [id number] notified.                  Action/Plan:d/c home w/HHC.   Expected Discharge Date:   (UNKNOWN)               Expected Discharge Plan:  [place]  In-House Referral:     Discharge planning Services  CM Consult  Post Acute Care Choice:    Choice offered to:  Patient  DME Arranged:    DME Agency:     HH Arranged:  PT, RN, OT, Nurse's Aide [place] Agency:  [facility]  Status of Service:  In process, will continue to follow  Medicare Important Message Given:    Date Medicare IM Given:    Medicare IM give by:    Date Additional Medicare IM Given:    Additional Medicare Important Message give by:     If discussed at [place] of Stay Meetings, dates discussed:    Additional Comments:  Dessa Phi, RN 11/02/2015, 2:07 PM

## 2015-11-03 DIAGNOSIS — G40909 Epilepsy, unspecified, not intractable, without status epilepticus: Secondary | ICD-10-CM | POA: Diagnosis present

## 2015-11-03 DIAGNOSIS — N390 Urinary tract infection, site not specified: Secondary | ICD-10-CM | POA: Diagnosis present

## 2015-11-03 DIAGNOSIS — Z86718 Personal history of other venous thrombosis and embolism: Secondary | ICD-10-CM | POA: Diagnosis not present

## 2015-11-03 DIAGNOSIS — X58XXXA Exposure to other specified factors, initial encounter: Secondary | ICD-10-CM | POA: Diagnosis present

## 2015-11-03 DIAGNOSIS — R569 Unspecified convulsions: Secondary | ICD-10-CM | POA: Diagnosis not present

## 2015-11-03 DIAGNOSIS — S42001A Fracture of unspecified part of right clavicle, initial encounter for closed fracture: Secondary | ICD-10-CM | POA: Diagnosis present

## 2015-11-03 DIAGNOSIS — R269 Unspecified abnormalities of gait and mobility: Secondary | ICD-10-CM

## 2015-11-03 DIAGNOSIS — J449 Chronic obstructive pulmonary disease, unspecified: Secondary | ICD-10-CM | POA: Diagnosis present

## 2015-11-03 DIAGNOSIS — Z8711 Personal history of peptic ulcer disease: Secondary | ICD-10-CM | POA: Diagnosis not present

## 2015-11-03 DIAGNOSIS — Z885 Allergy status to narcotic agent status: Secondary | ICD-10-CM | POA: Diagnosis not present

## 2015-11-03 DIAGNOSIS — S42001D Fracture of unspecified part of right clavicle, subsequent encounter for fracture with routine healing: Secondary | ICD-10-CM | POA: Diagnosis not present

## 2015-11-03 DIAGNOSIS — Z8249 Family history of ischemic heart disease and other diseases of the circulatory system: Secondary | ICD-10-CM | POA: Diagnosis not present

## 2015-11-03 DIAGNOSIS — Z888 Allergy status to other drugs, medicaments and biological substances status: Secondary | ICD-10-CM | POA: Diagnosis not present

## 2015-11-03 DIAGNOSIS — M549 Dorsalgia, unspecified: Secondary | ICD-10-CM | POA: Diagnosis present

## 2015-11-03 DIAGNOSIS — J45909 Unspecified asthma, uncomplicated: Secondary | ICD-10-CM | POA: Diagnosis present

## 2015-11-03 DIAGNOSIS — K219 Gastro-esophageal reflux disease without esophagitis: Secondary | ICD-10-CM | POA: Diagnosis present

## 2015-11-03 DIAGNOSIS — Z87442 Personal history of urinary calculi: Secondary | ICD-10-CM | POA: Diagnosis not present

## 2015-11-03 DIAGNOSIS — Z79899 Other long term (current) drug therapy: Secondary | ICD-10-CM | POA: Diagnosis not present

## 2015-11-03 DIAGNOSIS — Z8679 Personal history of other diseases of the circulatory system: Secondary | ICD-10-CM | POA: Diagnosis not present

## 2015-11-03 DIAGNOSIS — G8929 Other chronic pain: Secondary | ICD-10-CM | POA: Diagnosis present

## 2015-11-03 DIAGNOSIS — F17210 Nicotine dependence, cigarettes, uncomplicated: Secondary | ICD-10-CM | POA: Diagnosis present

## 2015-11-03 DIAGNOSIS — Z882 Allergy status to sulfonamides status: Secondary | ICD-10-CM | POA: Diagnosis not present

## 2015-11-03 DIAGNOSIS — M542 Cervicalgia: Secondary | ICD-10-CM | POA: Diagnosis present

## 2015-11-03 DIAGNOSIS — Z8673 Personal history of transient ischemic attack (TIA), and cerebral infarction without residual deficits: Secondary | ICD-10-CM | POA: Diagnosis not present

## 2015-11-03 LAB — COMPREHENSIVE METABOLIC PANEL
ALBUMIN: 3.5 g/dL (ref 3.5–5.0)
ALK PHOS: 49 U/L (ref 38–126)
ALT: 14 U/L (ref 14–54)
ANION GAP: 9 (ref 5–15)
AST: 18 U/L (ref 15–41)
BILIRUBIN TOTAL: 0.2 mg/dL — AB (ref 0.3–1.2)
BUN: 11 mg/dL (ref 6–20)
CALCIUM: 8.5 mg/dL — AB (ref 8.9–10.3)
CO2: 28 mmol/L (ref 22–32)
Chloride: 104 mmol/L (ref 101–111)
Creatinine, Ser: 0.61 mg/dL (ref 0.44–1.00)
GFR calc Af Amer: >60 mL/min (ref >60)
GFR calc non Af Amer: >60 mL/min (ref >60)
GLUCOSE: 91 mg/dL (ref 65–99)
Potassium: 3.5 mmol/L (ref 3.5–5.1)
Sodium: 141 mmol/L (ref 135–145)
TOTAL PROTEIN: 6.1 g/dL — AB (ref 6.5–8.1)

## 2015-11-03 LAB — URINE CULTURE

## 2015-11-03 LAB — PHENYTOIN LEVEL, TOTAL: Phenytoin Lvl: 19.6 ug/mL (ref 10.0–20.0)

## 2015-11-03 MED ORDER — ALUM & MAG HYDROXIDE-SIMETH 200-200-20 MG/5ML PO SUSP
15.0000 mL | ORAL | Status: DC | PRN
  Administered 2015-11-03: 15 mL via ORAL
  Filled 2015-11-03: qty 30

## 2015-11-03 NOTE — Progress Notes (Signed)
[facility] PROGRESS NOTE    Progress Note   [name] [id number] DOB: [birth year] DOA: 11/01/2015 PCP: [name], MD   Brief Narrative:   [name] is an 60 y.o. female Past medical history significant for seizures CVA COPD who comes in she presented with a witnessed seizure by her daughter today prior to admission.  Assessment/Plan:   Seizures ([place]): On admission she was found to have a supratherapeutic Dilantin levels, Dilantin held for 24 hours. She did have what seems to be like a UTI, She was also started on oral narcotics which could be contributing to her seizures.  COPD (chronic obstructive pulmonary disease) ([place]): Stable.  Gait disturbance Likely due to supratherapeutic they likely level And lack of peripheral vision. Disturbance improved. Resume Dilantin.  Right clavicle fracture: Sling in place, continue narcotics added ibuprofen and Protonix for pain control. Has a follow-up appointment with her orthopedic physician.  Possible Infection of urinary tract: Agree with IV Rocephin, awaiting urine cultures has remained afebrile. Has remained afebrile.    DVT Prophylaxis - Lovenox ordered.  Family Communication: none Disposition Plan: Home in 1 day Code Status:     Code Status Orders        Start     Ordered   11/02/15 0242  Full code   Continuous     11/02/15 0245    Code Status History    Date Active Date Inactive Code Status Order ID Comments User Context   03/31/2015 12:32 AM 04/01/2015  4:15 PM Full Code [id number]  Ivor Costa, MD Inpatient   01/18/2015  2:46 AM 01/19/2015  5:32 PM Full Code [id number]  Ivor Costa, MD Inpatient   01/10/2015 12:24 AM 01/11/2015  3:58 PM Full Code [id number]  Allyne Gee, MD ED   11/09/2014  9:00 PM 11/10/2014  8:19 PM Full Code [id number]  Deneise Lever, MD ED   05/15/2014  2:00 AM 05/15/2014  3:44 PM Full Code [id number]  Ivor Costa, MD Inpatient   06/17/2013  2:17 PM 06/20/2013  6:43 PM Full Code [id number]   Orson Eva, MD Inpatient   06/11/2013  8:07 PM 06/13/2013  6:58 PM Full Code [id number]  Jonetta Osgood, MD ED   04/15/2013 12:40 AM 04/15/2013  1:32 PM Full Code [id number]  Caren Griffins, MD ED   01/23/2013 12:30 AM 01/23/2013  6:42 PM Full Code [id number]  Theressa Millard, MD ED   12/13/2012  7:27 AM 12/19/2012  7:04 PM Full Code [id number]  Marzetta Board, MD ED   02/28/2012  2:10 AM 03/05/2012  7:58 PM Full Code [id number]  Almyra Free, RN Inpatient        IV Access:    Peripheral IV   Procedures and diagnostic studies:   Dg Cervical Spine 2 Or 3 Views  11/02/2015  CLINICAL DATA:  60 year old female with neck pain EXAM: CERVICAL SPINE - 2-3 VIEW COMPARISON:  None. FINDINGS: There is no acute fracture or subluxation of the cervical spine. There is degenerative changes most prominent at C5-C6 and C6-C7. The visualized spinous processes and odontoid appear intact. There is anatomic alignment of the lateral masses of the C1-C2. The soft tissues appear unremarkable. IMPRESSION: No acute fracture or subluxation. Electronically Signed   By: Anner Crete M.D.   On: 11/02/2015 03:35   Dg Chest Portable 1 View  11/01/2015  CLINICAL DATA:  Acute onset of right shoulder pain. Status post seizure. Cough. Initial encounter. EXAM: PORTABLE CHEST 1 VIEW COMPARISON:  Chest radiograph performed 06/21/2015 FINDINGS: There is a mildly displaced oblique fracture through the distal third of the right clavicle, with mild shortening and superior displacement at the fracture site. The lungs are well-aerated and clear. There is no evidence of focal opacification, pleural effusion or pneumothorax. The cardiomediastinal silhouette is within normal limits. No acute osseous abnormalities are seen. IMPRESSION: 1. Mildly displaced oblique fracture through the distal third of the right clavicle, with mild shortening and superior displacement at the fracture site. 2. Lungs remain grossly clear. Electronically Signed   By:  Garald Balding M.D.   On: 11/01/2015 23:48     Medical Consultants:    None.  Anti-Infectives:   Anti-infectives    Start     Dose/Rate Route Frequency Ordered Stop   11/02/15 0130  cefTRIAXone (ROCEPHIN) 1 g in dextrose 5 % 50 mL IVPB     1 g 100 mL/hr over 30 Minutes Intravenous Every 24 hours 11/02/15 0126        Subjective:    [place] she relates she continues to have shoulder pain.  Objective:    Filed Vitals:   11/02/15 0338 11/02/15 1245 11/02/15 2118 11/03/15 0503  BP: 111/58 110/55 130/73 118/63  Pulse: 79 65 66 83  Temp: 98.9 F (37.2 C) 98.1 F (36.7 C) 98.4 F (36.9 C) 98 F (36.7 C)  TempSrc: Oral Oral Oral Oral  Resp: 20 18 18 20   Height: 5\' 7"  (1.702 m)     Weight: 56.2 kg (123 lb 14.4 oz)     SpO2: 97% 97% 97% 94%    Intake/Output Summary (Last 24 hours) at 11/03/15 0947 Last data filed at 11/03/15 0214  Gross per 24 hour  Intake      0 ml  Output   1100 ml  Net  -1100 ml   Filed Weights   11/02/15 0338  Weight: 56.2 kg (123 lb 14.4 oz)    Exam: Gen:  NAD Cardiovascular:  RRR. Chest and lungs:   CTAB Abdomen:  Abdomen soft, NT/ND, + BS Extremities:  No edema   Data Reviewed:    Labs: Basic Metabolic Panel:  Recent Labs Lab 11/01/15 2118 11/03/15 0544  NA 138 141  K 3.8 3.5  CL 105 104  CO2 24 28  GLUCOSE 108* 91  BUN 13 11  CREATININE 0.59 0.61  CALCIUM 8.5* 8.5*   GFR Estimated Creatinine Clearance: 68.8 mL/min (by C-G formula based on Cr of 0.61). Liver Function Tests:  Recent Labs Lab 11/03/15 0544  AST 18  ALT 14  ALKPHOS 49  BILITOT 0.2*  PROT 6.1*  ALBUMIN 3.5   No results for input(s): LIPASE, AMYLASE in the last 168 hours. No results for input(s): AMMONIA in the last 168 hours. Coagulation profile No results for input(s): INR, PROTIME in the last 168 hours.  CBC:  Recent Labs Lab 11/01/15 2118  WBC 4.4  NEUTROABS 3.6  HGB 13.3  HCT 39.5  MCV 100.5*  PLT 123*   Cardiac  Enzymes: No results for input(s): CKTOTAL, CKMB, CKMBINDEX, TROPONINI in the last 168 hours. BNP (last 3 results) No results for input(s): PROBNP in the last 8760 hours. CBG: No results for input(s): GLUCAP in the last 168 hours. D-Dimer: No results for input(s): DDIMER in the last 72 hours. Hgb A1c: No results for input(s): HGBA1C in the last 72 hours. Lipid Profile: No results for input(s): CHOL, HDL, LDLCALC, TRIG, CHOLHDL, LDLDIRECT in the last 72 hours. Thyroid function studies: No  results for input(s): TSH, T4TOTAL, T3FREE, THYROIDAB in the last 72 hours.  Invalid input(s): FREET3 Anemia work up: No results for input(s): VITAMINB12, FOLATE, FERRITIN, TIBC, IRON, RETICCTPCT in the last 72 hours. Sepsis Labs:  Recent Labs Lab 11/01/15 2118  WBC 4.4   Microbiology No results found for this or any previous visit (from the past 240 hour(s)).   Medications:   . cefTRIAXone (ROCEPHIN)  IV  1 g Intravenous Q24H  . enoxaparin (LOVENOX) injection  40 mg Subcutaneous Q24H  . feeding supplement (ENSURE ENLIVE)  237 mL Oral BID BM  . lacosamide (VIMPAT) IV  200 mg Intravenous Q12H  . pantoprazole  40 mg Oral BID  . phenytoin  100 mg Oral Daily  . phenytoin  200 mg Oral QHS  . sodium chloride flush  3 mL Intravenous Q12H   Continuous Infusions:   Time spent: 25 min     Charlynne Cousins  [facility] Pager [phone]  *Please refer to [email or web address], password [email or web address] to get updated schedule on who will round on this patient, as hospitalists switch teams weekly. If 7PM-7AM, please contact night-coverage at [email or web address], password [email or web address] for any overnight needs.  11/03/2015, 9:47 AM

## 2015-11-04 DIAGNOSIS — S42001D Fracture of unspecified part of right clavicle, subsequent encounter for fracture with routine healing: Secondary | ICD-10-CM

## 2015-11-04 MED ORDER — PANTOPRAZOLE SODIUM 40 MG PO TBEC: 40.0000 mg | DELAYED_RELEASE_TABLET | Freq: Two times a day (BID) | ORAL | Status: DC

## 2015-11-04 MED ORDER — OXYCODONE HCL 5 MG PO TABS: 5.0000 mg | ORAL_TABLET | ORAL | Status: DC | PRN

## 2015-11-04 MED ORDER — PHENYTOIN SODIUM EXTENDED 100 MG PO CAPS: ORAL_CAPSULE | ORAL | Status: DC

## 2015-11-04 MED ORDER — IBUPROFEN 400 MG PO TABS: 400.0000 mg | ORAL_TABLET | ORAL | Status: DC | PRN

## 2015-11-04 MED ORDER — LACOSAMIDE 50 MG PO TABS
200.0000 mg | ORAL_TABLET | Freq: Two times a day (BID) | ORAL | Status: DC
  Administered 2015-11-04: 200 mg via ORAL
  Filled 2015-11-04: qty 4

## 2015-11-04 NOTE — Progress Notes (Signed)
Resumed care of patient. Agree with previous assessment. Orders reviewed and will continue to monitor.

## 2015-11-04 NOTE — Care Management Important Message (Signed)
Important Message  Patient Details  Name: [name] MRN: [id number] Date of Birth: Jun 01, 1956   Medicare Important Message Given:  Yes    [name], RN 11/04/2015, 11:03 AM

## 2015-11-04 NOTE — Discharge Summary (Signed)
Physician Discharge Summary  [name] [id number] DOB: 1956/04/03 DOA: 11/01/2015  PCP: [name], MD  Admit date: 11/01/2015 Discharge date: 11/04/2015  Time spent: 35 minutes  Recommendations for Outpatient Follow-up:  1. Follow-up with orthopedic surgeon as an outpatient.   Discharge Diagnoses:  Principal Problem:   Seizures ([name]) Active Problems:   Seizure disorder (HCC)   COPD (chronic obstructive pulmonary disease) (HCC)   Gait disturbance   Right clavicle fracture   Infection of urinary tract   Discharge Condition: stable  Diet recommendation: regular  Filed Weights   11/02/15 0338  Weight: 56.2 kg (123 lb 14.4 oz)    History of present illness:  60 year old female with a past medical history significant for seizure disorder, CVA, COPD, DVT; who presented after witnessed seizure by her daughter yesterday. Patient had been seen in the ED 2 days ago after having a seizure episode and at time was found to have a right clavicle fracture. She was evaluated and loaded with Dilantin and discharged home. The Dilantin level at that time was 23.3. Patient after getting home apparently had another seizure and was noted to be confused afterward, she will brought back to the ED.  Hospital Course:  Seizure disorder: On admission her Dilantin level was supratherapeutic neurology was consulted recommended to hold the Dilantin and resume at a lower dose. Unsure that this was a seizure, a UA was done that was not compatible with a UTI and urine cultures were negative. I have expressed to her that narcotics can lower the seizure threshold and she needs to be dishes with these.  COPD: Stable no changes were made.  Gait disturbance: I be due to supratherapeutic Dilantin level and lack of peripheral vision. Her Dilantin level was rechecked after holding her for 24 hours and it was in the therapeutic range.  Right clavicle fracture: Patient has her arm in a sling continue  narcotics and ibuprofen for pain control. I have expressed to her that she needs to focus on the use of ibuprofen and only use a narcotic as needed ibuprofen is not helping.  Possible urinary tract infection: She was started empirically on Rocephin she was asymptomatic no suprapubic discomfort but urine culture was negative.   Procedures:  CXR  C-spine  Consultations:  Neurology  Discharge Exam: Filed Vitals:   11/03/15 2130 11/04/15 0430  BP: 104/61 112/65  Pulse: 73 69  Temp: 98.3 F (36.8 C) 98.4 F (36.9 C)  Resp:  20    General: A&O x3 Cardiovascular: RRR Respiratory: good air movement CTA B/L  Discharge Instructions   Discharge Instructions    Diet - low sodium heart healthy    Complete by:  As directed      Increase activity slowly    Complete by:  As directed           Current Discharge Medication List    CONTINUE these medications which have CHANGED   Details  oxyCODONE (ROXICODONE) 5 MG immediate release tablet Take 1 tablet (5 mg total) by mouth every 4 (four) hours as needed for severe pain. Qty: 20 tablet, Refills: 0    phenytoin (DILANTIN) 100 MG ER capsule Take 100 mg every morning and 200 mg at bedtime. Must get a Dilantin level checked by her primary care physician within a week Qty: 60 capsule, Refills: 0      CONTINUE these medications which have NOT CHANGED   Details  albuterol (PROVENTIL HFA;VENTOLIN HFA) 108 (90 Base) MCG/ACT inhaler Inhale 2  puffs into the lungs every 6 (six) hours as needed for wheezing or shortness of breath.    feeding supplement, ENSURE ENLIVE, (ENSURE ENLIVE) LIQD Take 237 mLs by mouth 2 (two) times daily between meals. Qty: 60 Bottle, Refills: 0    lacosamide (VIMPAT) 200 MG TABS tablet Take 200 mg by mouth 2 (two) times daily.    Vitamin D, Ergocalciferol, (DRISDOL) 50000 UNITS CAPS capsule Take 50,000 Units by mouth once a week. On Wednesdays       Allergies  Allergen Reactions  . Zonisamide Other  (See Comments)    Numbness and tingling over whole body  . Codeine Nausea And Vomiting  . Keppra [Levetiracetam] Other (See Comments)    Causes seizures.   . Pregabalin Nausea And Vomiting  . Olive Oil Rash   Follow-up Information    Follow up with [facility].   Why:  HHRN/PT/OT/aide   Contact information:   [address] [place] [id number] [phone]        The results of significant diagnostics from this hospitalization (including imaging, microbiology, ancillary and laboratory) are listed below for reference.    Significant Diagnostic Studies: Dg Cervical Spine 2 Or 3 Views  11/02/2015  CLINICAL DATA:  60 year old female with neck pain EXAM: CERVICAL SPINE - 2-3 VIEW COMPARISON:  None. FINDINGS: There is no acute fracture or subluxation of the cervical spine. There is degenerative changes most prominent at C5-C6 and C6-C7. The visualized spinous processes and odontoid appear intact. There is anatomic alignment of the lateral masses of the C1-C2. The soft tissues appear unremarkable. IMPRESSION: No acute fracture or subluxation. Electronically Signed   By: Anner Crete M.D.   On: 11/02/2015 03:35   Dg Shoulder Right  10/31/2015  CLINICAL DATA:  60 year old female with seizure and right shoulder injury. EXAM: RIGHT SHOULDER - 2+ VIEW COMPARISON:  Radiograph dated 01/10/2015 FINDINGS: There is a displaced fracture of the distal aspect the right clavicle with approximately 6 mm superior displacement of the distal fracture fragment. The visualized portion of the humerus appears intact. No other fracture identified. The glenohumeral joint alignment is preserved. The soft tissues appear unremarkable. IMPRESSION: Mildly displaced fracture of the distal aspect of the right clavicle. Electronically Signed   By: Anner Crete M.D.   On: 10/31/2015 19:13   Dg Chest Portable 1 View  11/01/2015  CLINICAL DATA:  Acute onset of right shoulder pain. Status post  seizure. Cough. Initial encounter. EXAM: PORTABLE CHEST 1 VIEW COMPARISON:  Chest radiograph performed 06/21/2015 FINDINGS: There is a mildly displaced oblique fracture through the distal third of the right clavicle, with mild shortening and superior displacement at the fracture site. The lungs are well-aerated and clear. There is no evidence of focal opacification, pleural effusion or pneumothorax. The cardiomediastinal silhouette is within normal limits. No acute osseous abnormalities are seen. IMPRESSION: 1. Mildly displaced oblique fracture through the distal third of the right clavicle, with mild shortening and superior displacement at the fracture site. 2. Lungs remain grossly clear. Electronically Signed   By: Garald Balding M.D.   On: 11/01/2015 23:48    Microbiology: Recent Results (from the past 240 hour(s))  Urine culture     Status: None   Collection Time: 11/01/15 11:40 PM  Result Value Ref Range Status   Specimen Description URINE, CLEAN CATCH  Final   Special Requests NONE  Final   Culture   Final    MULTIPLE SPECIES PRESENT, SUGGEST RECOLLECTION Performed at [facility]  [facility]    Report Status 11/03/2015 FINAL  Final     Labs: Basic Metabolic Panel:  Recent Labs Lab 11/01/15 2118 11/03/15 0544  NA 138 141  K 3.8 3.5  CL 105 104  CO2 24 28  GLUCOSE 108* 91  BUN 13 11  CREATININE 0.59 0.61  CALCIUM 8.5* 8.5*   Liver Function Tests:  Recent Labs Lab 11/03/15 0544  AST 18  ALT 14  ALKPHOS 49  BILITOT 0.2*  PROT 6.1*  ALBUMIN 3.5   No results for input(s): LIPASE, AMYLASE in the last 168 hours. No results for input(s): AMMONIA in the last 168 hours. CBC:  Recent Labs Lab 11/01/15 2118  WBC 4.4  NEUTROABS 3.6  HGB 13.3  HCT 39.5  MCV 100.5*  PLT 123*   Cardiac Enzymes: No results for input(s): CKTOTAL, CKMB, CKMBINDEX, TROPONINI in the last 168 hours. BNP: BNP (last 3 results) No results for input(s): BNP in the last 8760 hours.  ProBNP  (last 3 results) No results for input(s): PROBNP in the last 8760 hours.  CBG: No results for input(s): GLUCAP in the last 168 hours.   Signed:  Charlynne Cousins MD.  [facility] 11/04/2015, 8:51 AM

## 2015-11-04 NOTE — Care Management Note (Signed)
Case Management Note  Patient Details  Name: [name] MRN: [id number] Date of Birth: [birth year]  Subjective/Objective:      seizures              Action/Plan: Discharge Planning: AVS reviewed: NCM spoke to pt and states no DME is needed. Pt states her dtr is at home to assist with her care. She can afford her medications at home. Contacted attending for [facility] orders. Notified [facility] liaison of scheduled dc home today with HH.    PCP Dr [name]   Expected Discharge Date: 11/04/2015                Expected Discharge Plan:  [place]  In-House Referral:  NA  Discharge planning Services  CM Consult  Post Acute Care Choice:  Home Health Choice offered to:  Patient  DME Arranged:  N/A DME Agency:  NA  HH Arranged:  PT, RN, OT, Nurse's Aide [place] Agency:  [place]  Status of Service:  Completed, signed off  Medicare Important Message Given:  Yes Date Medicare IM Given:    Medicare IM give by:    Date Additional Medicare IM Given:    Additional Medicare Important Message give by:     If discussed at [place] of Stay Meetings, dates discussed:    Additional Comments:  [name], RN 11/04/2015, 11:07 AM

## 2015-11-15 DIAGNOSIS — S42021D Displaced fracture of shaft of right clavicle, subsequent encounter for fracture with routine healing: Secondary | ICD-10-CM | POA: Diagnosis not present

## 2015-11-15 DIAGNOSIS — Z8673 Personal history of transient ischemic attack (TIA), and cerebral infarction without residual deficits: Secondary | ICD-10-CM | POA: Diagnosis not present

## 2015-11-15 DIAGNOSIS — G40909 Epilepsy, unspecified, not intractable, without status epilepticus: Secondary | ICD-10-CM | POA: Diagnosis not present

## 2015-11-15 DIAGNOSIS — J449 Chronic obstructive pulmonary disease, unspecified: Secondary | ICD-10-CM | POA: Diagnosis not present

## 2015-11-15 DIAGNOSIS — Z86718 Personal history of other venous thrombosis and embolism: Secondary | ICD-10-CM | POA: Diagnosis not present

## 2015-11-15 DIAGNOSIS — W19XXXD Unspecified fall, subsequent encounter: Secondary | ICD-10-CM | POA: Diagnosis not present

## 2015-11-15 DIAGNOSIS — R269 Unspecified abnormalities of gait and mobility: Secondary | ICD-10-CM | POA: Diagnosis not present

## 2015-11-21 DIAGNOSIS — J449 Chronic obstructive pulmonary disease, unspecified: Secondary | ICD-10-CM | POA: Diagnosis not present

## 2015-11-21 DIAGNOSIS — W19XXXD Unspecified fall, subsequent encounter: Secondary | ICD-10-CM | POA: Diagnosis not present

## 2015-11-21 DIAGNOSIS — R269 Unspecified abnormalities of gait and mobility: Secondary | ICD-10-CM | POA: Diagnosis not present

## 2015-11-21 DIAGNOSIS — S42021D Displaced fracture of shaft of right clavicle, subsequent encounter for fracture with routine healing: Secondary | ICD-10-CM | POA: Diagnosis not present

## 2015-11-21 DIAGNOSIS — G40909 Epilepsy, unspecified, not intractable, without status epilepticus: Secondary | ICD-10-CM | POA: Diagnosis not present

## 2015-11-21 DIAGNOSIS — Z86718 Personal history of other venous thrombosis and embolism: Secondary | ICD-10-CM | POA: Diagnosis not present

## 2015-11-21 DIAGNOSIS — Z8673 Personal history of transient ischemic attack (TIA), and cerebral infarction without residual deficits: Secondary | ICD-10-CM | POA: Diagnosis not present

## 2015-12-03 DIAGNOSIS — W19XXXD Unspecified fall, subsequent encounter: Secondary | ICD-10-CM | POA: Diagnosis not present

## 2015-12-03 DIAGNOSIS — Z86718 Personal history of other venous thrombosis and embolism: Secondary | ICD-10-CM | POA: Diagnosis not present

## 2015-12-03 DIAGNOSIS — G40909 Epilepsy, unspecified, not intractable, without status epilepticus: Secondary | ICD-10-CM | POA: Diagnosis not present

## 2015-12-03 DIAGNOSIS — S42021D Displaced fracture of shaft of right clavicle, subsequent encounter for fracture with routine healing: Secondary | ICD-10-CM | POA: Diagnosis not present

## 2015-12-03 DIAGNOSIS — Z8673 Personal history of transient ischemic attack (TIA), and cerebral infarction without residual deficits: Secondary | ICD-10-CM | POA: Diagnosis not present

## 2015-12-03 DIAGNOSIS — R269 Unspecified abnormalities of gait and mobility: Secondary | ICD-10-CM | POA: Diagnosis not present

## 2015-12-03 DIAGNOSIS — J449 Chronic obstructive pulmonary disease, unspecified: Secondary | ICD-10-CM | POA: Diagnosis not present

## 2015-12-05 DIAGNOSIS — R269 Unspecified abnormalities of gait and mobility: Secondary | ICD-10-CM | POA: Diagnosis not present

## 2015-12-05 DIAGNOSIS — J449 Chronic obstructive pulmonary disease, unspecified: Secondary | ICD-10-CM | POA: Diagnosis not present

## 2015-12-05 DIAGNOSIS — S42021D Displaced fracture of shaft of right clavicle, subsequent encounter for fracture with routine healing: Secondary | ICD-10-CM | POA: Diagnosis not present

## 2015-12-05 DIAGNOSIS — Z86718 Personal history of other venous thrombosis and embolism: Secondary | ICD-10-CM | POA: Diagnosis not present

## 2015-12-05 DIAGNOSIS — W19XXXD Unspecified fall, subsequent encounter: Secondary | ICD-10-CM | POA: Diagnosis not present

## 2015-12-05 DIAGNOSIS — G40909 Epilepsy, unspecified, not intractable, without status epilepticus: Secondary | ICD-10-CM | POA: Diagnosis not present

## 2015-12-05 DIAGNOSIS — Z8673 Personal history of transient ischemic attack (TIA), and cerebral infarction without residual deficits: Secondary | ICD-10-CM | POA: Diagnosis not present

## 2015-12-07 DIAGNOSIS — G40909 Epilepsy, unspecified, not intractable, without status epilepticus: Secondary | ICD-10-CM | POA: Diagnosis not present

## 2015-12-07 DIAGNOSIS — R269 Unspecified abnormalities of gait and mobility: Secondary | ICD-10-CM | POA: Diagnosis not present

## 2015-12-07 DIAGNOSIS — Z8673 Personal history of transient ischemic attack (TIA), and cerebral infarction without residual deficits: Secondary | ICD-10-CM | POA: Diagnosis not present

## 2015-12-07 DIAGNOSIS — W19XXXD Unspecified fall, subsequent encounter: Secondary | ICD-10-CM | POA: Diagnosis not present

## 2015-12-07 DIAGNOSIS — Z86718 Personal history of other venous thrombosis and embolism: Secondary | ICD-10-CM | POA: Diagnosis not present

## 2015-12-07 DIAGNOSIS — S42021D Displaced fracture of shaft of right clavicle, subsequent encounter for fracture with routine healing: Secondary | ICD-10-CM | POA: Diagnosis not present

## 2015-12-07 DIAGNOSIS — J449 Chronic obstructive pulmonary disease, unspecified: Secondary | ICD-10-CM | POA: Diagnosis not present

## 2015-12-12 DIAGNOSIS — W19XXXD Unspecified fall, subsequent encounter: Secondary | ICD-10-CM | POA: Diagnosis not present

## 2015-12-12 DIAGNOSIS — G40909 Epilepsy, unspecified, not intractable, without status epilepticus: Secondary | ICD-10-CM | POA: Diagnosis not present

## 2015-12-12 DIAGNOSIS — Z86718 Personal history of other venous thrombosis and embolism: Secondary | ICD-10-CM | POA: Diagnosis not present

## 2015-12-12 DIAGNOSIS — Z8673 Personal history of transient ischemic attack (TIA), and cerebral infarction without residual deficits: Secondary | ICD-10-CM | POA: Diagnosis not present

## 2015-12-12 DIAGNOSIS — J449 Chronic obstructive pulmonary disease, unspecified: Secondary | ICD-10-CM | POA: Diagnosis not present

## 2015-12-12 DIAGNOSIS — R269 Unspecified abnormalities of gait and mobility: Secondary | ICD-10-CM | POA: Diagnosis not present

## 2015-12-12 DIAGNOSIS — S42021D Displaced fracture of shaft of right clavicle, subsequent encounter for fracture with routine healing: Secondary | ICD-10-CM | POA: Diagnosis not present

## 2015-12-24 DIAGNOSIS — J449 Chronic obstructive pulmonary disease, unspecified: Secondary | ICD-10-CM | POA: Diagnosis not present

## 2015-12-24 DIAGNOSIS — S42021D Displaced fracture of shaft of right clavicle, subsequent encounter for fracture with routine healing: Secondary | ICD-10-CM | POA: Diagnosis not present

## 2015-12-24 DIAGNOSIS — Z8673 Personal history of transient ischemic attack (TIA), and cerebral infarction without residual deficits: Secondary | ICD-10-CM | POA: Diagnosis not present

## 2015-12-24 DIAGNOSIS — R269 Unspecified abnormalities of gait and mobility: Secondary | ICD-10-CM | POA: Diagnosis not present

## 2015-12-24 DIAGNOSIS — Z86718 Personal history of other venous thrombosis and embolism: Secondary | ICD-10-CM | POA: Diagnosis not present

## 2015-12-24 DIAGNOSIS — G40909 Epilepsy, unspecified, not intractable, without status epilepticus: Secondary | ICD-10-CM | POA: Diagnosis not present

## 2015-12-24 DIAGNOSIS — W19XXXD Unspecified fall, subsequent encounter: Secondary | ICD-10-CM | POA: Diagnosis not present

## 2016-06-10 ENCOUNTER — Emergency Department (HOSPITAL_COMMUNITY)
Admission: EM | Admit: 2016-06-10 | Discharge: 2016-06-11 | Disposition: A | Discharge status: Home / Self Care | Payer: Commercial Managed Care - HMO | Attending: Emergency Medicine | Admitting: Emergency Medicine

## 2016-06-10 ENCOUNTER — Emergency Department (HOSPITAL_COMMUNITY): Payer: Commercial Managed Care - HMO

## 2016-06-10 ENCOUNTER — Encounter (HOSPITAL_COMMUNITY): Payer: Self-pay | Admitting: Emergency Medicine

## 2016-06-10 DIAGNOSIS — M546 Pain in thoracic spine: Secondary | ICD-10-CM | POA: Diagnosis not present

## 2016-06-10 DIAGNOSIS — J449 Chronic obstructive pulmonary disease, unspecified: Secondary | ICD-10-CM | POA: Diagnosis not present

## 2016-06-10 DIAGNOSIS — R200 Anesthesia of skin: Secondary | ICD-10-CM | POA: Diagnosis not present

## 2016-06-10 DIAGNOSIS — W1830XA Fall on same level, unspecified, initial encounter: Secondary | ICD-10-CM | POA: Insufficient documentation

## 2016-06-10 DIAGNOSIS — Y929 Unspecified place or not applicable: Secondary | ICD-10-CM | POA: Insufficient documentation

## 2016-06-10 DIAGNOSIS — T148XXA Other injury of unspecified body region, initial encounter: Secondary | ICD-10-CM

## 2016-06-10 DIAGNOSIS — F17210 Nicotine dependence, cigarettes, uncomplicated: Secondary | ICD-10-CM | POA: Diagnosis not present

## 2016-06-10 DIAGNOSIS — M62838 Other muscle spasm: Secondary | ICD-10-CM

## 2016-06-10 DIAGNOSIS — Z8673 Personal history of transient ischemic attack (TIA), and cerebral infarction without residual deficits: Secondary | ICD-10-CM | POA: Diagnosis not present

## 2016-06-10 DIAGNOSIS — Y999 Unspecified external cause status: Secondary | ICD-10-CM | POA: Insufficient documentation

## 2016-06-10 DIAGNOSIS — S32010A Wedge compression fracture of first lumbar vertebra, initial encounter for closed fracture: Secondary | ICD-10-CM | POA: Diagnosis not present

## 2016-06-10 DIAGNOSIS — R202 Paresthesia of skin: Secondary | ICD-10-CM | POA: Diagnosis not present

## 2016-06-10 DIAGNOSIS — Y939 Activity, unspecified: Secondary | ICD-10-CM | POA: Diagnosis not present

## 2016-06-10 DIAGNOSIS — S3992XA Unspecified injury of lower back, initial encounter: Secondary | ICD-10-CM | POA: Diagnosis present

## 2016-06-10 DIAGNOSIS — S39012A Strain of muscle, fascia and tendon of lower back, initial encounter: Secondary | ICD-10-CM | POA: Diagnosis not present

## 2016-06-10 DIAGNOSIS — M545 Low back pain: Secondary | ICD-10-CM | POA: Diagnosis not present

## 2016-06-10 LAB — I-STAT BETA HCG BLOOD, ED (MC, WL, AP ONLY)

## 2016-06-10 LAB — BASIC METABOLIC PANEL
ANION GAP: 11 (ref 5–15)
BUN: <5 mg/dL — ABNORMAL LOW (ref 6–20)
CHLORIDE: 101 mmol/L (ref 101–111)
CO2: 24 mmol/L (ref 22–32)
Calcium: 8.9 mg/dL (ref 8.9–10.3)
Creatinine, Ser: 0.56 mg/dL (ref 0.44–1.00)
GFR calc Af Amer: >60 mL/min (ref >60)
GLUCOSE: 90 mg/dL (ref 65–99)
POTASSIUM: 3.1 mmol/L — AB (ref 3.5–5.1)
Sodium: 136 mmol/L (ref 135–145)

## 2016-06-10 LAB — CK: CK TOTAL: 52 U/L (ref 38–234)

## 2016-06-10 MED ORDER — METHOCARBAMOL 1000 MG/10ML IJ SOLN
1000.0000 mg | Freq: Once | INTRAMUSCULAR | Status: AC
  Administered 2016-06-10: 1000 mg via INTRAVENOUS
  Filled 2016-06-10: qty 10

## 2016-06-10 MED ORDER — METHOCARBAMOL 500 MG PO TABS: 500.0000 mg | ORAL_TABLET | Freq: Two times a day (BID) | ORAL | 0 refills | Status: AC

## 2016-06-10 MED ORDER — METHOCARBAMOL 1000 MG/10ML IJ SOLN
1000.0000 mg | Freq: Once | INTRAMUSCULAR | Status: DC
  Filled 2016-06-10: qty 10

## 2016-06-10 MED ORDER — METHOCARBAMOL 1000 MG/10ML IJ SOLN: 1000.0000 mg | Freq: Once | INTRAMUSCULAR | Status: DC

## 2016-06-10 MED ORDER — KETOROLAC TROMETHAMINE 15 MG/ML IJ SOLN
15.0000 mg | Freq: Once | INTRAMUSCULAR | Status: AC
  Administered 2016-06-10: 15 mg via INTRAVENOUS
  Filled 2016-06-10: qty 1

## 2016-06-10 MED ORDER — ACETAMINOPHEN 500 MG PO TABS: 1000.0000 mg | ORAL_TABLET | Freq: Three times a day (TID) | ORAL | 0 refills | Status: AC

## 2016-06-10 NOTE — ED Notes (Signed)
Pharmacy contacted to verify new robaxin dose.

## 2016-06-10 NOTE — ED Notes (Addendum)
Paged [facility].  They will bring the lumbar corsett but will take about 90 minutes to arrive.  Pt agreed to wait.  Informed MD

## 2016-06-10 NOTE — ED Triage Notes (Signed)
Pt sts fall 2 nights ago after having seizure with hx of same and c/o back pain and some tingling to bottoms of feet

## 2016-06-10 NOTE — ED Notes (Signed)
Pt states bilateral feet feel tight.

## 2016-06-10 NOTE — ED Provider Notes (Signed)
[facility] DEPT Provider Note   CSN: [id number] Arrival date & time: 06/10/16  1537     History   Chief Complaint Chief Complaint  Patient presents with  . Fall    HPI [name] is a 60 y.o. female.  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 She reports that the fall occurred 2 days ago while she was trying to dry her hair. She reports falling and landing on top of the blow dryer on her left side. She has been trying to manage the pain with medication and heat pads which has not been helping. Pain is exacerbated with movement. Patient also endorses associated bilateral lower extremity numbness and paresthesias, which is new since the fall and has been gradually worsening over the past 2 days. She denies any urinary or bowel incontinence. No other physical complaints at this time.   Past Medical History:  Diagnosis Date  . Asthma   . Cerebral hemorrhage ([place]) 1989  . Chronic back pain   . Chronic bronchitis ([place])    "gets it q yr"  . COPD (chronic obstructive pulmonary disease) ([place])   . DDD (degenerative disc disease) 06/12/2013  . DVT (deep venous thrombosis) ([place])    "she's had several since 1990"  . GERD (gastroesophageal reflux disease)   . Headache    "usually around time when she's had a seizure"  . History of blood transfusion    "when she was a baby"  . History of stomach ulcers   . Kidney stones   . Seizures ([place])    "because of her brain surgery"  . Stroke [facility]) [phone]   family denies residual on 11/09/2014  . Tunnel vision    "since brain OR"    Patient Active Problem List   Diagnosis Date Noted  . Seizures ([place]) 11/02/2015  . Gait disturbance 11/02/2015  . Right clavicle fracture 11/02/2015  . Infection of urinary tract 11/02/2015  . Chest pain 03/31/2015  . Acute on chronic respiratory  failure with hypoxia ([place]) 03/31/2015  . SOB (shortness of breath) 03/31/2015  . Anxiety state 03/31/2015  . Malnutrition of moderate degree ([place]) 03/31/2015  . COPD exacerbation ([place]) 03/30/2015  . Hypoxia 01/18/2015  . Chronic obstructive pulmonary disease with acute exacerbation ([place])   . Fever 01/17/2015  . Cough 01/17/2015  . Sepsis ([place]) 01/17/2015  . COPD (chronic obstructive pulmonary disease) ([place])   . Unintentional weight loss 01/11/2015  . Asthma 05/15/2014  . Seizure ([place]) 05/15/2014  . Protein calorie malnutrition ([place]) 05/15/2014  . DVT of leg (deep venous thrombosis) ([place]) 06/19/2013  . DDD (degenerative disc disease) 06/12/2013  . Paresthesias 06/12/2013  . Tremor 06/09/2013  . Thrombocytopenia- chronic 06/29/2012  . Noncompliance with medication treatment due to underuse of medication 06/29/2012  . Stress-personal 06/29/2012  . Recurrent seizures ([place]) 06/29/2012  . Tobacco dependence 06/28/2012  . Skull fracture ([place]) 02/28/2012  . Dilantin toxicity 02/28/2012  . Seizure disorder ([place]) 02/28/2012  . History of Intracranial bleed 02/28/2012    Past Surgical History:  Procedure Laterality Date  . [place]   craniotomy with hematoma evacuation  . CESAREAN SECTION  1979; 1987; 1989  . HEMORRHOID SURGERY    . TUBAL LIGATION  1990's    OB History    No data available       Home  Medications    Prior to Admission medications   Medication Sig Start Date End Date Taking? Authorizing Provider  lacosamide (VIMPAT) 200 MG TABS tablet Take 200 mg by mouth 2 (two) times daily.   Yes Historical Provider, MD  phenytoin (DILANTIN) 100 MG ER capsule Take 100 mg every morning and 200 mg at bedtime. Must get a Dilantin level checked by her primary care physician within a week Patient taking differently: Take 100-200 mg by mouth 2 (two) times daily. Take 1 capsule every morning with 2 capsules of 30 mg to equal 160 and take 2 capsules at bedtime. 11/04/15  Yes  Charlynne Cousins, MD  phenytoin (DILANTIN) 30 MG ER capsule Take 60 mg by mouth daily. Take with 100 mg to equal 160 mg   Yes Historical Provider, MD  acetaminophen (TYLENOL) 500 MG tablet Take 2 tablets (1,000 mg total) by mouth every 8 (eight) hours. Do not take more than 4000 mg of acetaminophen (Tylenol) in a 24-hour period. Please note that other medicines that you may be prescribed may have Tylenol as well. 06/10/16 06/15/16  Fatima Blank, MD  feeding supplement, ENSURE ENLIVE, (ENSURE ENLIVE) LIQD Take 237 mLs by mouth 2 (two) times daily between meals. Patient not taking: Reported on 06/10/2016 04/01/15   Velta Addison Mikhail, DO  ibuprofen (ADVIL,MOTRIN) 400 MG tablet Take 1 tablet (400 mg total) by mouth every 4 (four) hours as needed for fever or moderate pain. Patient not taking: Reported on 06/10/2016 11/04/15   Charlynne Cousins, MD  methocarbamol (ROBAXIN) 500 MG tablet Take 1 tablet (500 mg total) by mouth 2 (two) times daily. 06/10/16 06/17/16  Fatima Blank, MD  oxyCODONE (ROXICODONE) 5 MG immediate release tablet Take 1 tablet (5 mg total) by mouth every 4 (four) hours as needed for severe pain. Patient not taking: Reported on 06/10/2016 11/04/15   Charlynne Cousins, MD  pantoprazole (PROTONIX) 40 MG tablet Take 1 tablet (40 mg total) by mouth 2 (two) times daily. Patient not taking: Reported on 06/10/2016 11/04/15   Charlynne Cousins, MD    Family History Family History  Problem Relation Age of Onset  . Heart attack Mother   . Heart attack Father     Social History Social History  Substance Use Topics  . Smoking status: Current Every Day Smoker    Packs/day: 1.50    Years: 40.00    Types: Cigarettes  . Smokeless tobacco: Never Used  . Alcohol use No     Allergies   Zonisamide; Codeine; Keppra [levetiracetam]; Pregabalin; and Olive oil   Review of Systems Review of Systems Ten systems are reviewed and are negative for acute change except as noted in  the HPI   Physical Exam Updated Vital Signs BP 125/66   Pulse 81   Temp 98.4 F (36.9 C) (Oral)   Resp 18   Ht 5\' 7"  (1.702 m)   Wt 128 lb (58.1 kg)   SpO2 96%   BMI 20.05 kg/m   Physical Exam  Constitutional: She is oriented to person, place, and time. She appears well-developed and well-nourished. No distress.  HENT:  Head: Normocephalic and atraumatic.  Nose: Nose normal.  Eyes: Conjunctivae and EOM are normal. Pupils are equal, round, and reactive to light. Right eye exhibits no discharge. Left eye exhibits no discharge. No scleral icterus.  Neck: Normal range of motion. Neck supple.  Cardiovascular: Normal rate and regular rhythm.  Exam reveals no gallop and no friction rub.  No murmur heard. Pulmonary/Chest: Effort normal and breath sounds normal. No stridor. No respiratory distress. She has no rales.  Abdominal: Soft. She exhibits no distension. There is no tenderness.  Musculoskeletal: She exhibits no edema or tenderness.       Thoracic back: She exhibits bony tenderness.       Lumbar back: She exhibits bony tenderness.       Back:  Neurological: She is alert and oriented to person, place, and time.  Spine Exam: Strength: 4/5 throughout LE bilaterally (hip flexion/extension, adduction/abduction; knee flexion/extension; foot dorsiflexion/plantarflexion, inversion/eversion; great toe inversion) Sensation: Decreased sensation to bilateral lower extremities to light touch and pinprick Reflexes: 1+ quadriceps and achilles reflexes Intact Rectal tone   Skin: Skin is warm and dry. No rash noted. She is not diaphoretic. No erythema.  Psychiatric: She has a normal mood and affect.  Vitals reviewed.    ED Treatments / Results  Labs (all labs ordered are listed, but only abnormal results are displayed) Labs Reviewed  BASIC METABOLIC PANEL - Abnormal; Notable for the following:       Result Value   Potassium 3.1 (*)    BUN <5 (*)    All other components within normal  limits  CK  CBC WITH DIFFERENTIAL/PLATELET  I-STAT BETA HCG BLOOD, ED ([facility], [facility], [facility] ONLY)    EKG  EKG Interpretation None       Radiology Dg Thoracic Spine 2 View  Result Date: 06/10/2016 CLINICAL DATA:  [place] 2 days ago. Pain mid thoracic spine region radiating throughout the lumbar spine. Numbness in both legs. EXAM: THORACIC SPINE 2 VIEWS COMPARISON:  Two-view chest 06/21/2015 FINDINGS: Diffuse degenerative change throughout the thoracic spine with mild lower thoracic kyphosis. Mild anterior wedge deformities of mid and lower thoracic vertebrae, likely degenerative. Appearance is unchanged since previous study. No anterior subluxation of the thoracic vertebrae. Bone cortex appears intact. No acute compression is suggested. No paraspinal soft tissue swelling. Incidental note of aortic atherosclerosis. IMPRESSION: Degenerative changes in the thoracic spine with mild lower thoracic kyphosis unchanged since prior studies. No acute displaced fractures identified. Electronically Signed   By: Lucienne Capers M.D.   On: 06/10/2016 22:09   Dg Lumbar Spine 2-3 Views  Result Date: 06/10/2016 CLINICAL DATA:  [place] 2 days ago. Pain mid thoracic spine region radiating throughout the lumbar spine. Numbness in the legs. EXAM: LUMBAR SPINE - 2-3 VIEW COMPARISON:  01/19/2015 FINDINGS: Normal alignment of the lumbar spine. Mild degenerative changes with endplate hypertrophic changes throughout. Mild disc space narrowing. Degenerative changes in the facet joints. No acute vertebral compression deformities. Bone cortex appears intact. Vascular calcifications in the aorta. IMPRESSION: Degenerative changes in the lumbar spine. No acute displaced fractures identified. Electronically Signed   By: Lucienne Capers M.D.   On: 06/10/2016 22:11   Mr Thoracic Spine Limited Wo Contrast  Result Date: 06/10/2016 CLINICAL DATA:  60 y/o F; status post fall 2 nights ago with back pain and tingling to the bottoms of the feet.  EXAM: MRI THORACIC AND LUMBAR SPINE WITHOUT CONTRAST TECHNIQUE: Multiplanar and multiecho pulse sequences of the thoracic and lumbar spine were obtained without intravenous contrast. COMPARISON:  01/19/2015 lumbar radiographs. FINDINGS: MRI THORACIC SPINE FINDINGS Alignment: Mild thoracic levocurvature with apex at T9. Thoracic kyphosis is maintained. No listhesis. Vertebrae: T12 superior endplate Schmorl's node. T2 hyperintense foci are present in the T3, T7 and T10 vertebral bodies. No evidence for discitis or fracture. Mild anterior loss of height of the T11 and T12  vertebral bodies is stable from prior radiographs. Cord:  No abnormal cord signal. Paraspinal and other soft tissues: Negative. Disc levels: There are multiple levels of disc desiccation with disc space narrowing and anterior marginal osteophytes. There is no significant disc displacement, foraminal narrowing, or canal stenosis of the thoracic spine. MRI LUMBAR SPINE FINDINGS Segmentation:  Standard. Alignment:  Normal lumbar lordosis.  No listhesis. Vertebrae: Mild endplate degenerative edema at the L2-3 and L4-5 intervertebral discs levels. Small L3 inferior endplate Schmorl's node. Mild anterior compression deformity of the superior endplate of L1 vertebral body with edema likely representing acute fracture. No retropulsion of posterior elements. Conus medullaris: Extends to the L1-2 level and appears normal. Paraspinal and other soft tissues: Negative. Disc levels: L1-2: No significant disc displacement, foraminal narrowing, or canal stenosis. L2-3: Small disc bulge and mild facet and ligamentum flavum hypertrophy. Mild foraminal and lateral recess narrowing. No significant canal stenosis. L3-4: Small disc bulge and mild facet and ligamentum flavum hypertrophy. Mild foraminal and lateral recess narrowing. No significant canal stenosis. L4-5: Small disc bulge and mild facet and ligamentum flavum hypertrophy. Mild foraminal and lateral recess  narrowing. No significant canal stenosis. L5-S1: Minimal disc bulge and mild facet hypertrophy without significant foraminal narrowing or canal stenosis. IMPRESSION: MR THORACIC SPINE IMPRESSION 1. No evidence for discitis, fracture, or neural impingement. 2. Nonspecific T2 hyperintense foci within the T3, T7 and T10 vertebral bodies. In the absence of primary malignancy or myeloma these probably represent fat poor hemangioma. MR LUMBAR SPINE IMPRESSION 1. Mild anterior compression deformity at the superior L1 endplate with bone marrow edema likely representing acute/subacute injury. Loss of vertebral height is new from 6/16. 2. Mild degenerative changes of lumbar spine with mild foraminal narrowing at the L2 through L5 levels. No high-grade canal stenosis, foraminal narrowing, or neural impingement. These results were called by telephone at the time of interpretation on 06/10/2016 at 10:08 pm to Dr. Addison Lank , who verbally acknowledged these results. Electronically Signed   By: Kristine Garbe M.D.   On: 06/10/2016 22:08   Mr Lumbar Spine Limited Wo Contrast  Result Date: 06/10/2016 CLINICAL DATA:  60 y/o F; status post fall 2 nights ago with back pain and tingling to the bottoms of the feet. EXAM: MRI THORACIC AND LUMBAR SPINE WITHOUT CONTRAST TECHNIQUE: Multiplanar and multiecho pulse sequences of the thoracic and lumbar spine were obtained without intravenous contrast. COMPARISON:  01/19/2015 lumbar radiographs. FINDINGS: MRI THORACIC SPINE FINDINGS Alignment: Mild thoracic levocurvature with apex at T9. Thoracic kyphosis is maintained. No listhesis. Vertebrae: T12 superior endplate Schmorl's node. T2 hyperintense foci are present in the T3, T7 and T10 vertebral bodies. No evidence for discitis or fracture. Mild anterior loss of height of the T11 and T12 vertebral bodies is stable from prior radiographs. Cord:  No abnormal cord signal. Paraspinal and other soft tissues: Negative. Disc levels:  There are multiple levels of disc desiccation with disc space narrowing and anterior marginal osteophytes. There is no significant disc displacement, foraminal narrowing, or canal stenosis of the thoracic spine. MRI LUMBAR SPINE FINDINGS Segmentation:  Standard. Alignment:  Normal lumbar lordosis.  No listhesis. Vertebrae: Mild endplate degenerative edema at the L2-3 and L4-5 intervertebral discs levels. Small L3 inferior endplate Schmorl's node. Mild anterior compression deformity of the superior endplate of L1 vertebral body with edema likely representing acute fracture. No retropulsion of posterior elements. Conus medullaris: Extends to the L1-2 level and appears normal. Paraspinal and other soft tissues: Negative. Disc levels: L1-2: No  significant disc displacement, foraminal narrowing, or canal stenosis. L2-3: Small disc bulge and mild facet and ligamentum flavum hypertrophy. Mild foraminal and lateral recess narrowing. No significant canal stenosis. L3-4: Small disc bulge and mild facet and ligamentum flavum hypertrophy. Mild foraminal and lateral recess narrowing. No significant canal stenosis. L4-5: Small disc bulge and mild facet and ligamentum flavum hypertrophy. Mild foraminal and lateral recess narrowing. No significant canal stenosis. L5-S1: Minimal disc bulge and mild facet hypertrophy without significant foraminal narrowing or canal stenosis. IMPRESSION: MR THORACIC SPINE IMPRESSION 1. No evidence for discitis, fracture, or neural impingement. 2. Nonspecific T2 hyperintense foci within the T3, T7 and T10 vertebral bodies. In the absence of primary malignancy or myeloma these probably represent fat poor hemangioma. MR LUMBAR SPINE IMPRESSION 1. Mild anterior compression deformity at the superior L1 endplate with bone marrow edema likely representing acute/subacute injury. Loss of vertebral height is new from 6/16. 2. Mild degenerative changes of lumbar spine with mild foraminal narrowing at the L2  through L5 levels. No high-grade canal stenosis, foraminal narrowing, or neural impingement. These results were called by telephone at the time of interpretation on 06/10/2016 at 10:08 pm to Dr. Addison Lank , who verbally acknowledged these results. Electronically Signed   By: Kristine Garbe M.D.   On: 06/10/2016 22:08    Procedures Procedures (including critical care time)  Medications Ordered in ED Medications  ketorolac (TORADOL) 15 MG/ML injection 15 mg (15 mg Intravenous Given 06/10/16 1958)  methocarbamol (ROBAXIN) 1,000 mg in dextrose 5 % 50 mL IVPB (0 mg Intravenous Stopped 06/10/16 2258)     Initial Impression / Assessment and Plan / ED Course  I have reviewed the triage vital signs and the nursing notes.  Pertinent labs & imaging results that were available during my care of the patient were reviewed by me and considered in my medical decision making (see chart for details).  Clinical Course     Workup revealed new L1 compression fracture without evidence of cauda equina or spinal cord compression. Lumbar corset provided for the patient. Instructed to follow up with neurosurgery.  Final Clinical Impressions(s) / ED Diagnoses   Final diagnoses:  Bilateral leg numbness  Closed compression fracture of first lumbar vertebra, initial encounter ([place])  Muscle strain  Muscle spasm  Paresthesia   Disposition: Discharge  Condition: Good  I have discussed the results, Dx and Tx plan with the patient who expressed understanding and agree(s) with the plan. Discharge instructions discussed at great length. The patient was given strict return precautions who verbalized understanding of the instructions. No further questions at time of discharge.    New Prescriptions   ACETAMINOPHEN (TYLENOL) 500 MG TABLET    Take 2 tablets (1,000 mg total) by mouth every 8 (eight) hours. Do not take more than 4000 mg of acetaminophen (Tylenol) in a 24-hour period. Please note that other  medicines that you may be prescribed may have Tylenol as well.   METHOCARBAMOL (ROBAXIN) 500 MG TABLET    Take 1 tablet (500 mg total) by mouth 2 (two) times daily.    Follow Up: [name], MD [address] [place] [address] [phone]  Schedule an appointment as soon as possible for a visit  As needed for pain control  Consuella Lose, MD [address] [place] [address] [phone]  Schedule an appointment as soon as possible for a visit  For close follow up to assess for L1 spine fracture  New Prescriptions New Prescriptions   ACETAMINOPHEN (TYLENOL) 500 MG TABLET    Take 2 tablets (1,000 mg total) by mouth every 8 (eight) hours. Do not take more than 4000 mg of acetaminophen (Tylenol) in a 24-hour period. Please note that other medicines that you may be prescribed may have Tylenol as well.   METHOCARBAMOL (ROBAXIN) 500 MG TABLET    Take 1 tablet (500 mg total) by mouth 2 (two) times daily.     Fatima Blank, MD 06/10/16 2350

## 2016-06-11 NOTE — ED Notes (Signed)
Lumbar corsett applied and education done by company provider.

## 2016-06-17 DIAGNOSIS — Z885 Allergy status to narcotic agent status: Secondary | ICD-10-CM | POA: Diagnosis not present

## 2016-06-17 DIAGNOSIS — Z780 Asymptomatic menopausal state: Secondary | ICD-10-CM | POA: Diagnosis not present

## 2016-06-17 DIAGNOSIS — Z86718 Personal history of other venous thrombosis and embolism: Secondary | ICD-10-CM | POA: Diagnosis not present

## 2016-06-17 DIAGNOSIS — G40219 Localization-related (focal) (partial) symptomatic epilepsy and epileptic syndromes with complex partial seizures, intractable, without status epilepticus: Secondary | ICD-10-CM | POA: Diagnosis not present

## 2016-06-17 DIAGNOSIS — Z79899 Other long term (current) drug therapy: Secondary | ICD-10-CM | POA: Diagnosis not present

## 2016-06-17 DIAGNOSIS — Z8673 Personal history of transient ischemic attack (TIA), and cerebral infarction without residual deficits: Secondary | ICD-10-CM | POA: Diagnosis not present

## 2016-06-17 DIAGNOSIS — Q282 Arteriovenous malformation of cerebral vessels: Secondary | ICD-10-CM | POA: Diagnosis not present

## 2016-06-17 DIAGNOSIS — Z87891 Personal history of nicotine dependence: Secondary | ICD-10-CM | POA: Diagnosis not present

## 2016-06-17 DIAGNOSIS — I619 Nontraumatic intracerebral hemorrhage, unspecified: Secondary | ICD-10-CM | POA: Diagnosis not present

## 2016-06-17 DIAGNOSIS — Z888 Allergy status to other drugs, medicaments and biological substances status: Secondary | ICD-10-CM | POA: Diagnosis not present

## 2016-06-17 DIAGNOSIS — F419 Anxiety disorder, unspecified: Secondary | ICD-10-CM | POA: Diagnosis not present

## 2016-06-17 DIAGNOSIS — J449 Chronic obstructive pulmonary disease, unspecified: Secondary | ICD-10-CM | POA: Diagnosis not present

## 2016-06-23 IMAGING — CT CT HEAD W/O CM
1 of 2 series · 16 of 30 positions shown, 20 images · non-contrast
Comparison: 06/11/2013

CLINICAL DATA: Seizures.  Initial encounter.

EXAM:
CT HEAD WITHOUT CONTRAST
TECHNIQUE: Contiguous axial images were obtained from the base of the skull
through the vertex without intravenous contrast.

[Series 3: head 2.0 h70h · axial · 0.41mm/px · z∈[-166,-32]mm · 16 of 75 slices shown, 20 images]
[im 4/75  brain]
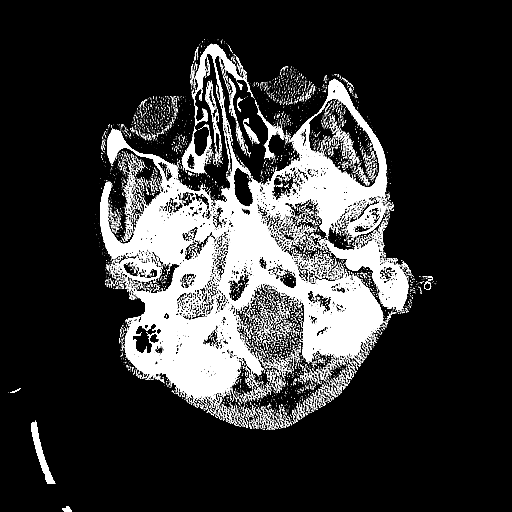
[im 4/75  bone]
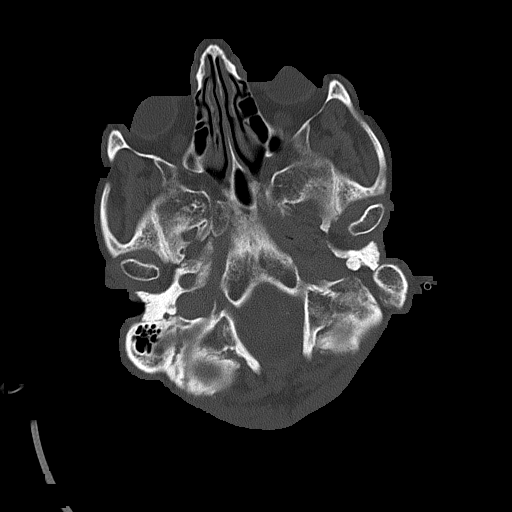
[im 8/75  brain]
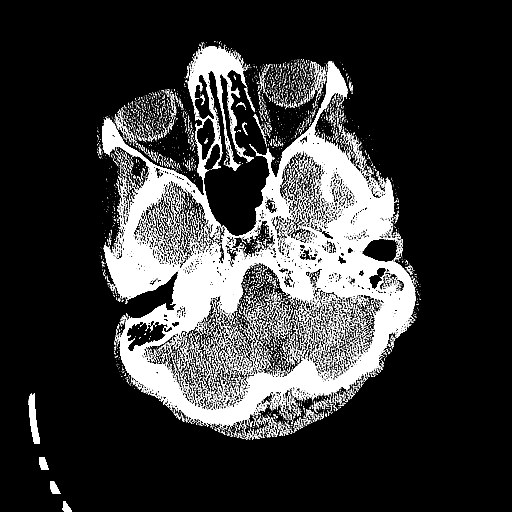
[im 12/75  brain]
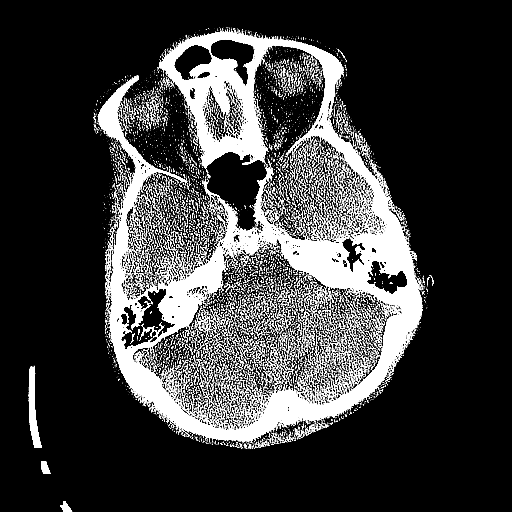
[im 19/75  brain]
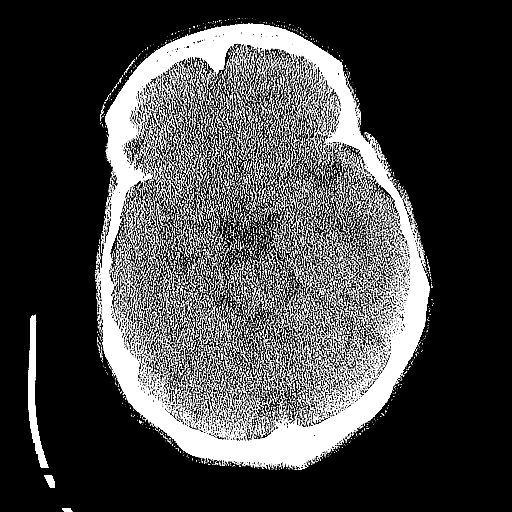
[im 23/75  brain]
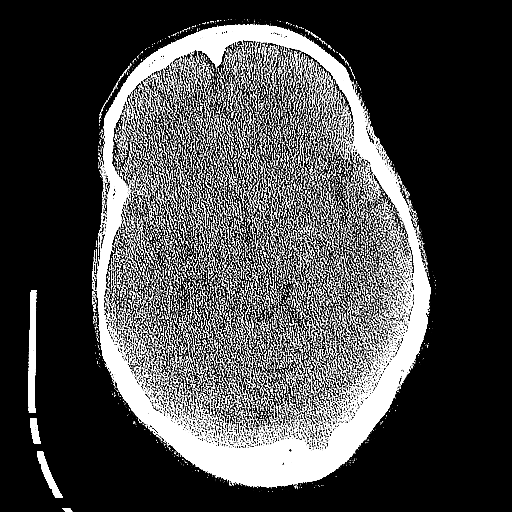
[im 23/75  bone]
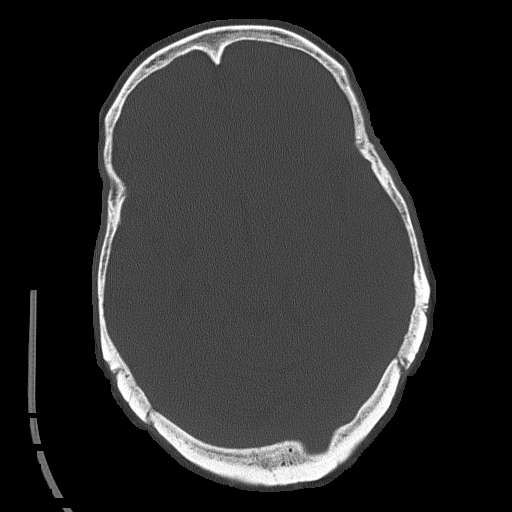
[im 26/75  brain]
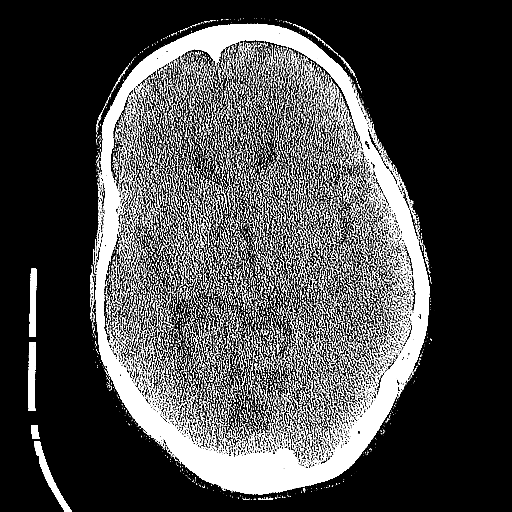
[im 30/75  brain]
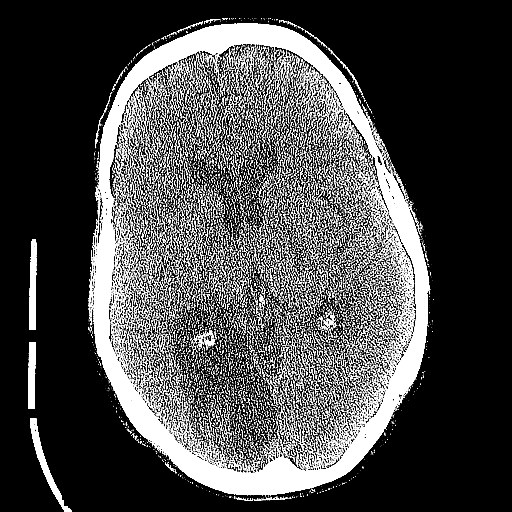
[im 34/75  brain]
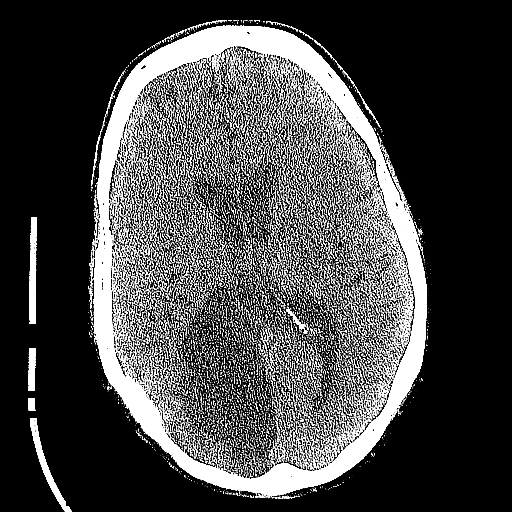
[im 41/75  brain]
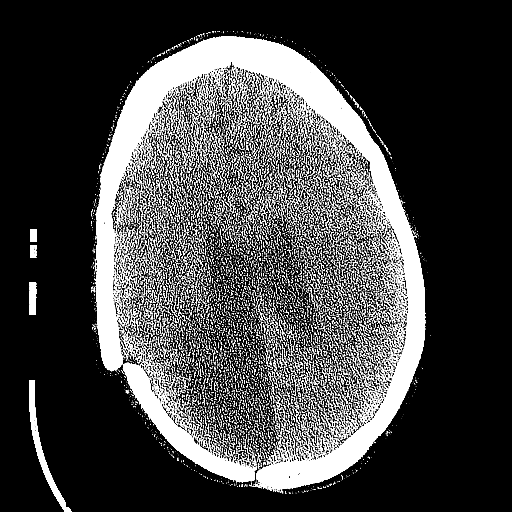
[im 41/75  bone]
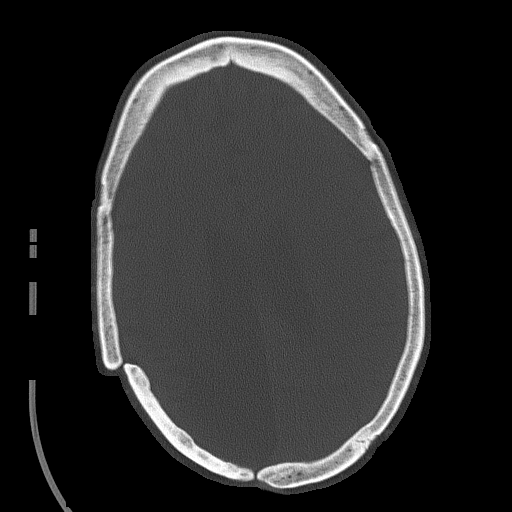
[im 45/75  brain]
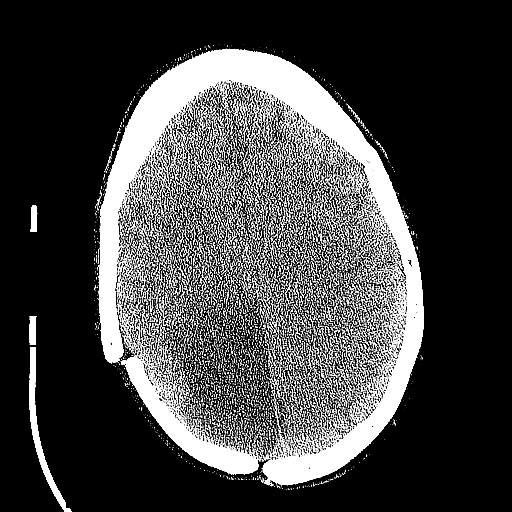
[im 49/75  brain]
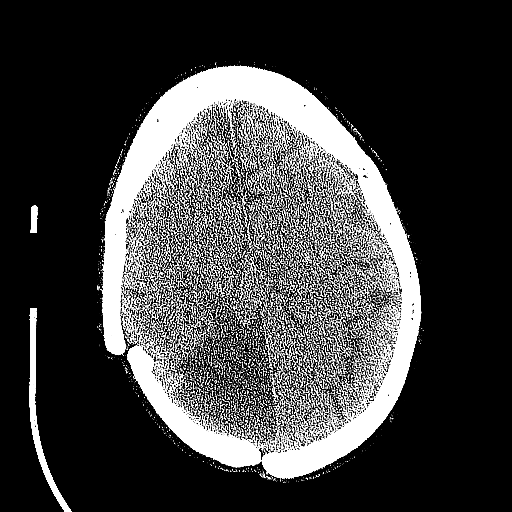
[im 52/75  brain]
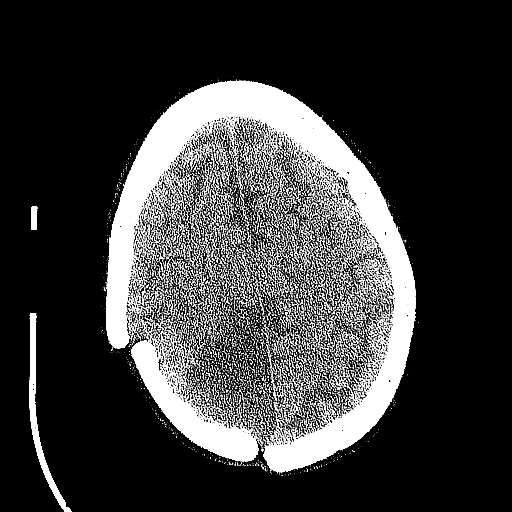
[im 56/75  brain]
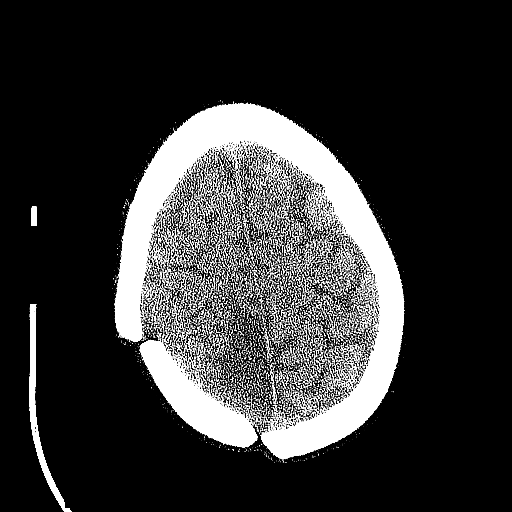
[im 56/75  bone]
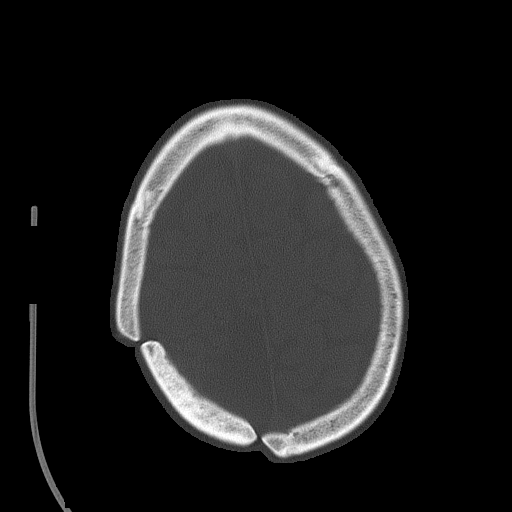
[im 63/75  brain]
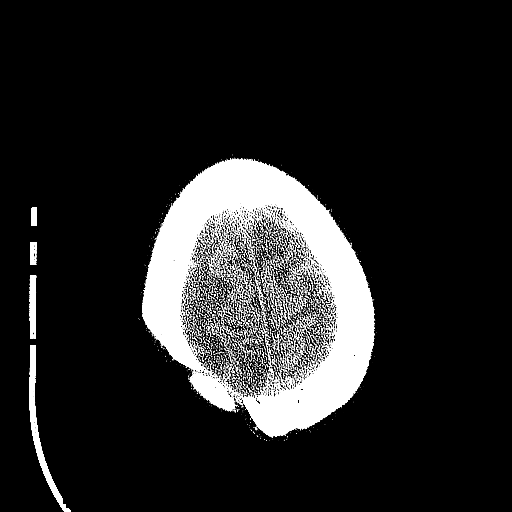
[im 67/75  brain]
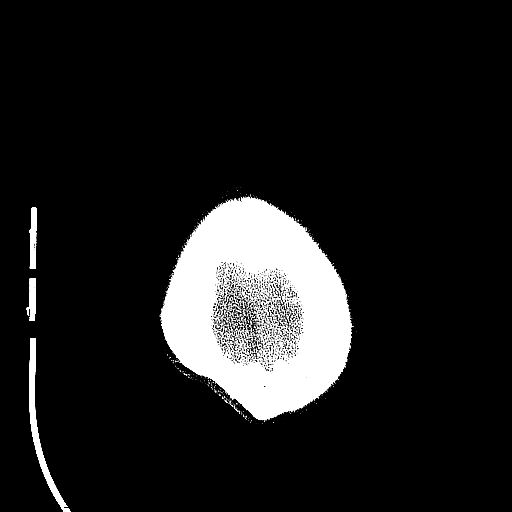
[im 71/75  brain]
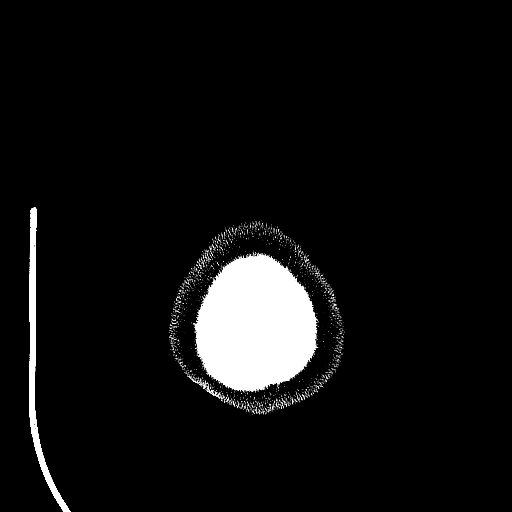

[16 of 30 positions shown; findings below may reference images not displayed]

FINDINGS: Skull and Sinuses:Unchanged appearance of a remote right parietal
craniotomy, with the bone flap chronically, mildly depressed along
its anterior margin. No acute bony findings.

Orbits: No acute abnormality.

Brain: Extensive encephalomalacia and gliosis centered in the right
occipital and parietal regions is unchanged in appearance and
extent. There is no neighboring edema or hemorrhage. No evidence of
acute infarct, hydrocephalus, mass lesion or shift. Stable mild
cerebral volume loss which is generalized.
IMPRESSION: 1. No acute intracranial findings.
2. Stable right parietal-occipital encephalomalacia.

## 2016-07-17 DIAGNOSIS — G40119 Localization-related (focal) (partial) symptomatic epilepsy and epileptic syndromes with simple partial seizures, intractable, without status epilepticus: Secondary | ICD-10-CM | POA: Diagnosis not present

## 2016-07-17 DIAGNOSIS — G40219 Localization-related (focal) (partial) symptomatic epilepsy and epileptic syndromes with complex partial seizures, intractable, without status epilepticus: Secondary | ICD-10-CM | POA: Diagnosis not present

## 2016-07-17 DIAGNOSIS — Q282 Arteriovenous malformation of cerebral vessels: Secondary | ICD-10-CM | POA: Diagnosis not present

## 2016-11-13 DIAGNOSIS — S32010D Wedge compression fracture of first lumbar vertebra, subsequent encounter for fracture with routine healing: Secondary | ICD-10-CM | POA: Diagnosis not present

## 2016-11-27 IMAGING — US US EXTREM LOW VENOUS*R*
1 series · 14 of 24 positions shown · non-contrast
Comparison: None

CLINICAL DATA: Swelling, pain, edema. Numbness in feet. Previous
left DVT. Previous tobacco abuse.

EXAM:
RIGHT LOWER EXTREMITY VENOUS DOPPLER ULTRASOUND
TECHNIQUE: Gray-scale sonography with compression, as well as color and duplex
ultrasound, were performed to evaluate the deep venous system from
the level of the common femoral vein through the popliteal and
proximal calf veins.

[Series 1: us extrem low venous*right* · 14 of 29 slices shown]
[im 1/29]
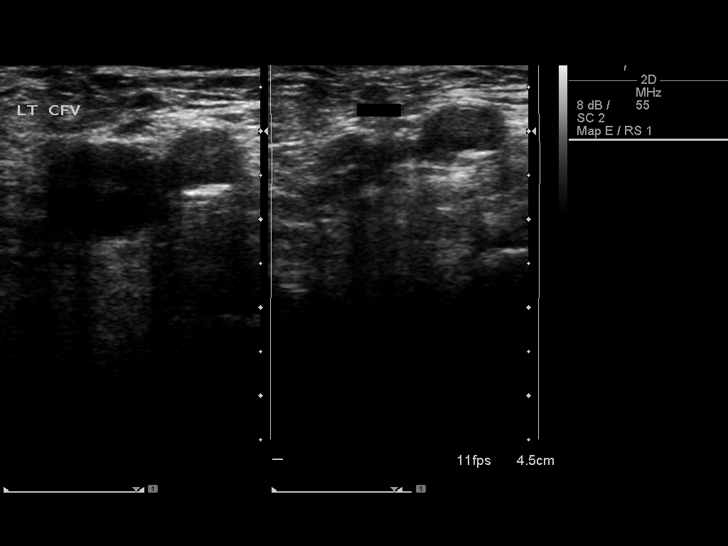
[im 3/29]
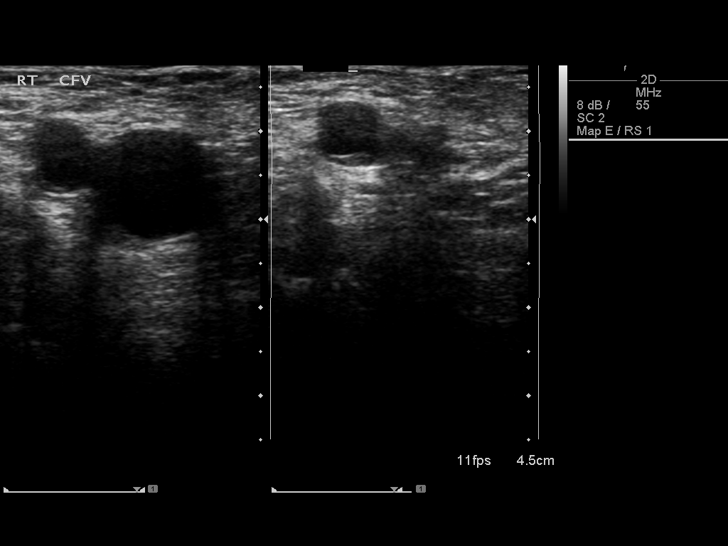
[im 5/29]
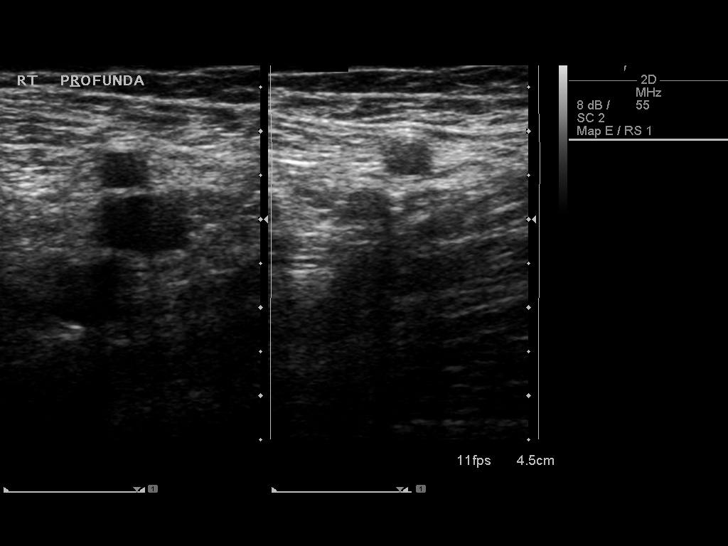
[im 8/29]
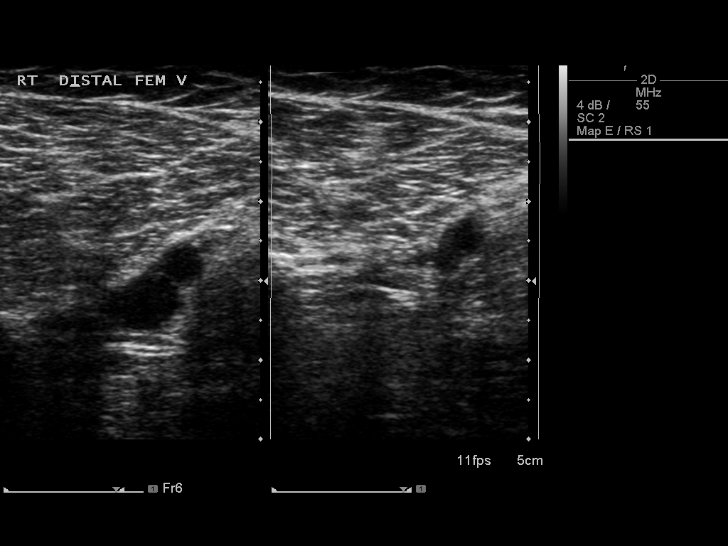
[im 9/29]
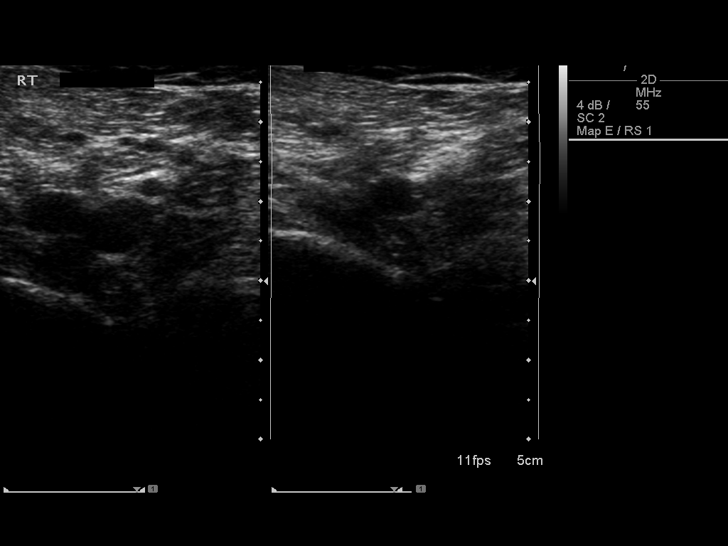
[im 11/29]
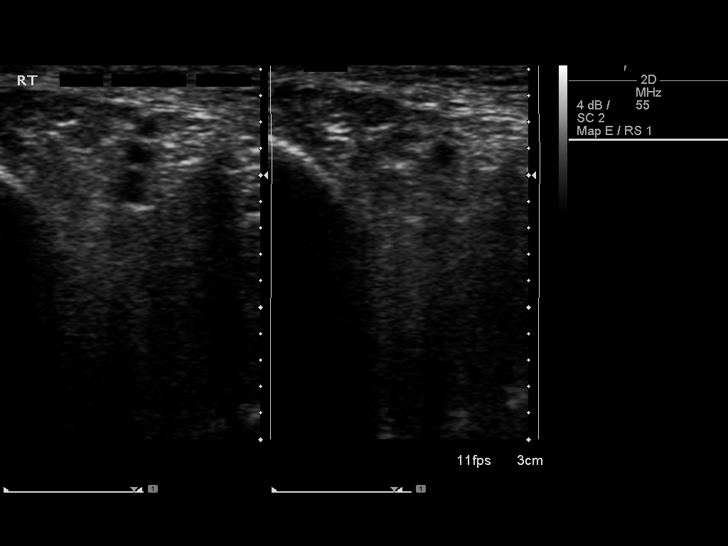
[im 14/29]
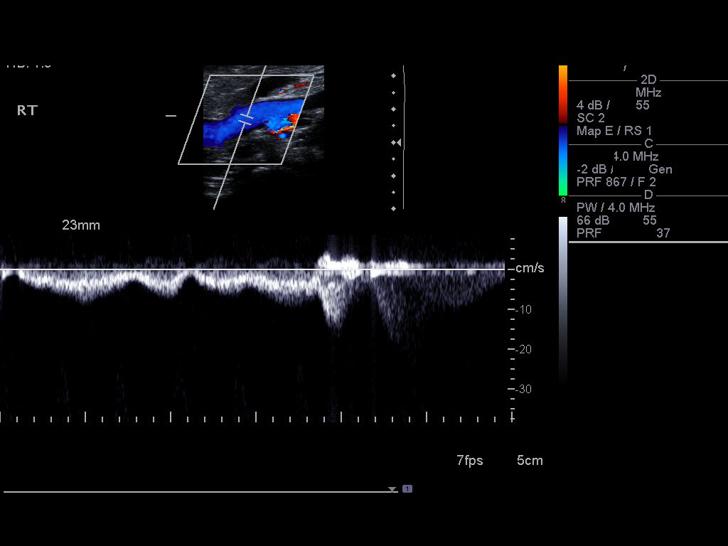
[im 15/29]
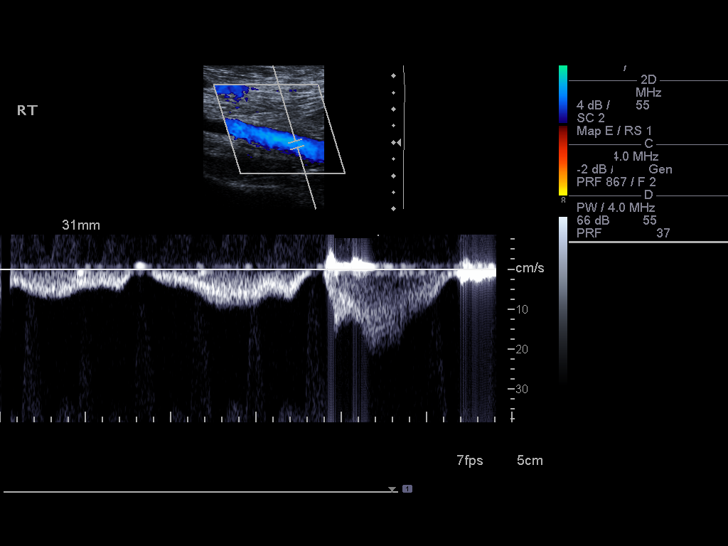
[im 18/29]
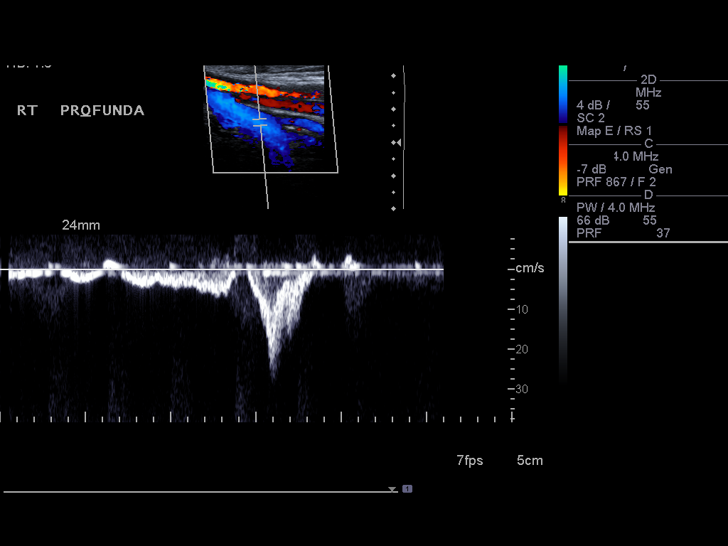
[im 20/29]
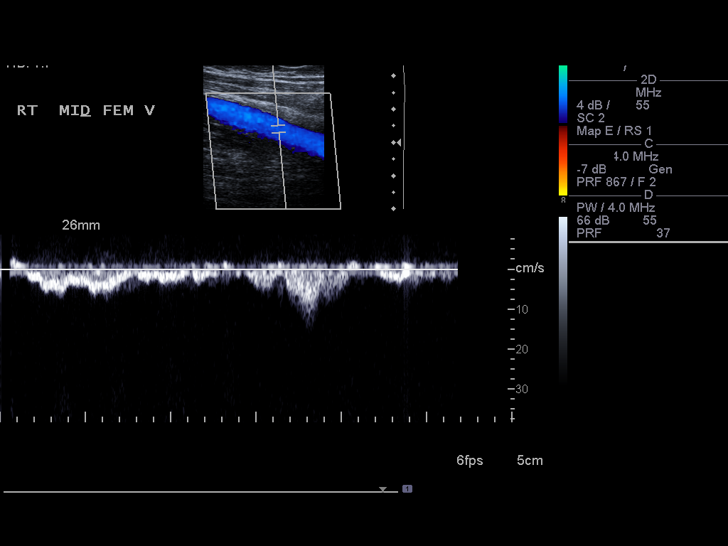
[im 22/29]
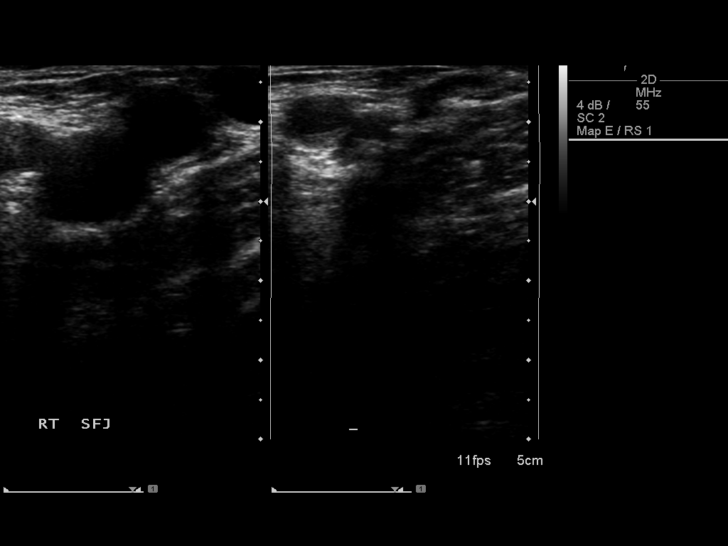
[im 24/29]
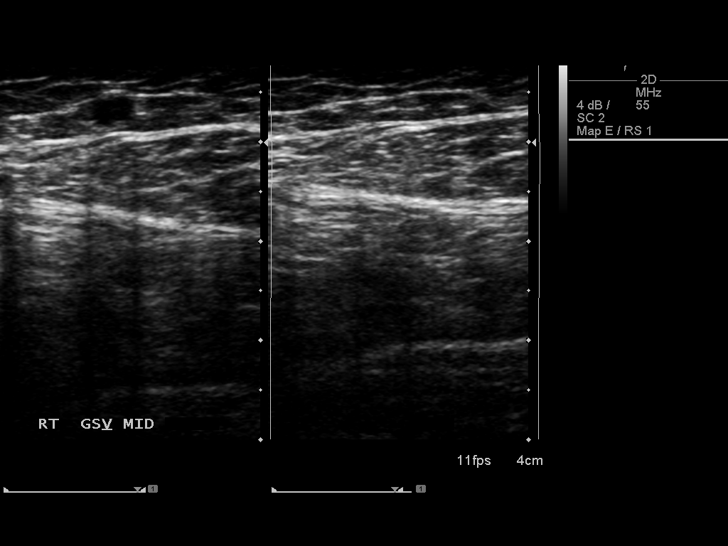
[im 26/29]
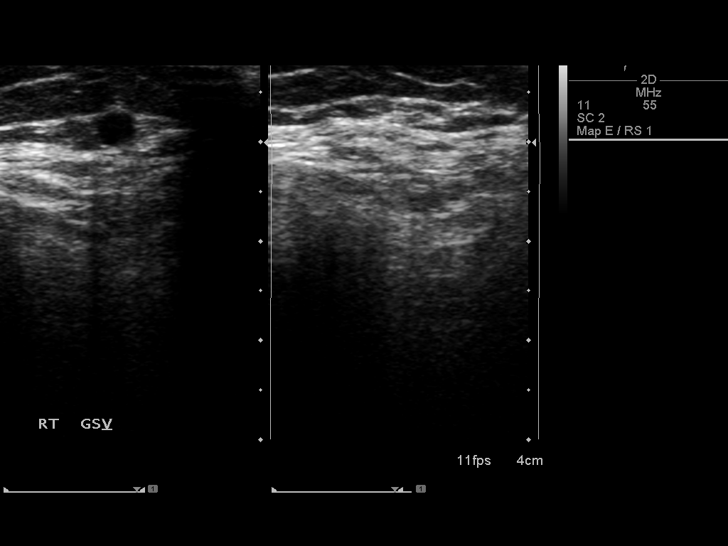
[im 29/29]
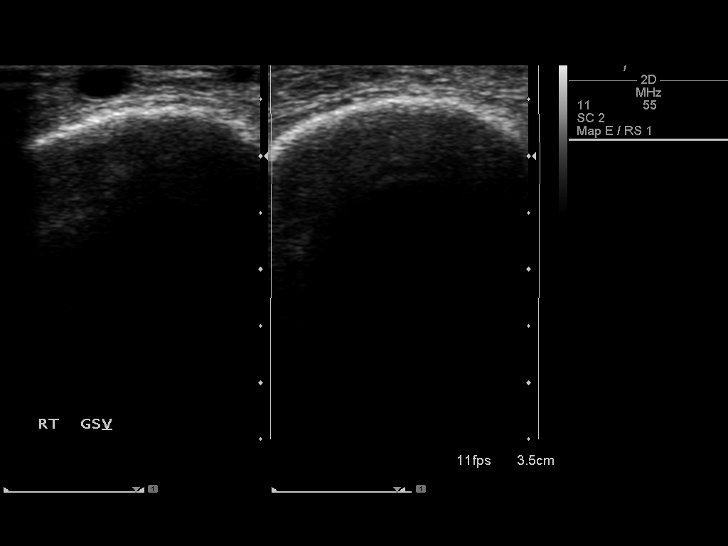

[14 of 24 positions shown; findings below may reference images not displayed]

FINDINGS: Normal compressibility of the common femoral, superficial femoral,
and popliteal veins, as well as the proximal calf veins. No filling
defects to suggest DVT on grayscale or color Doppler imaging.
Doppler waveforms show normal direction of venous flow, normal
respiratory phasicity and response to augmentation. Visualized
segments of the saphenous venous system normal in caliber and
compressibility. Survey views of the contralateral common femoral
vein are unremarkable.
IMPRESSION: 1. No evidence of lower extremity deep vein thrombosis, RIGHT.

## 2016-12-19 IMAGING — CT CT HEAD W/O CM
3 series · 17 of 30 positions shown, 19 images · non-contrast
Comparison: 05/14/2014

CLINICAL DATA: Two seizures today with fall. Previous history of
seizures on Dilantin.

EXAM:
CT HEAD WITHOUT CONTRAST
TECHNIQUE: Contiguous axial images were obtained from the base of the skull
through the vertex without intravenous contrast.

[Series 3: head w/o · axial · non-contrast · 0.45mm/px · z∈[+1046,+1171]mm · 6 of 37 slices shown, 8 images]
[im 6/37  brain]
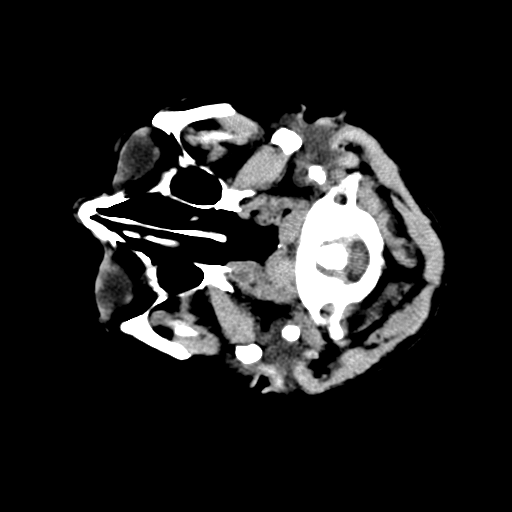
[im 6/37  bone]
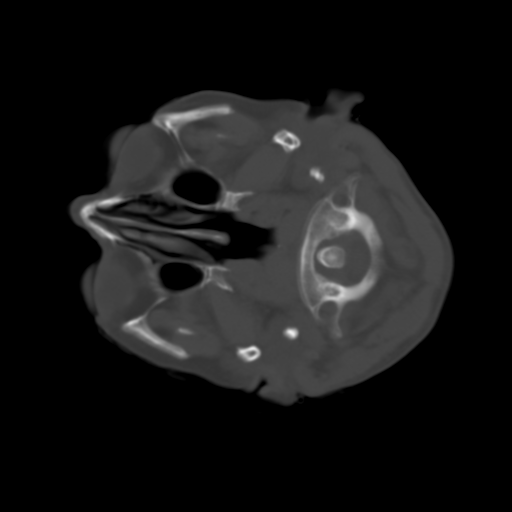
[im 11/37  brain]
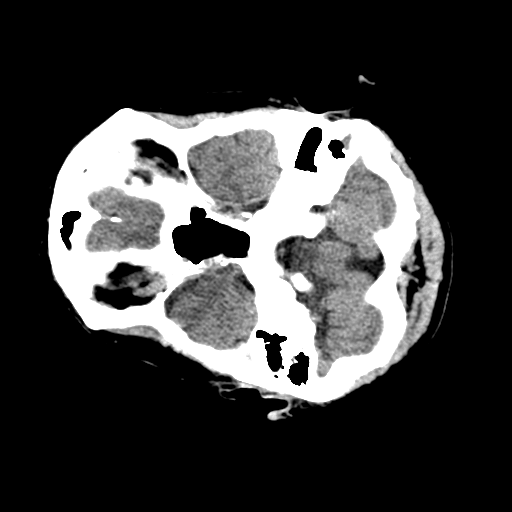
[im 16/37  brain]
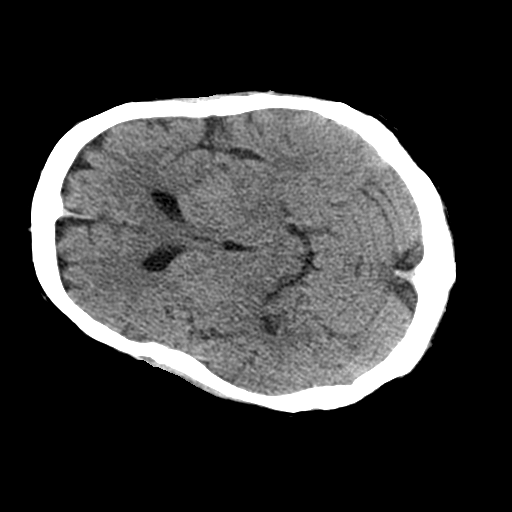
[im 21/37  brain]
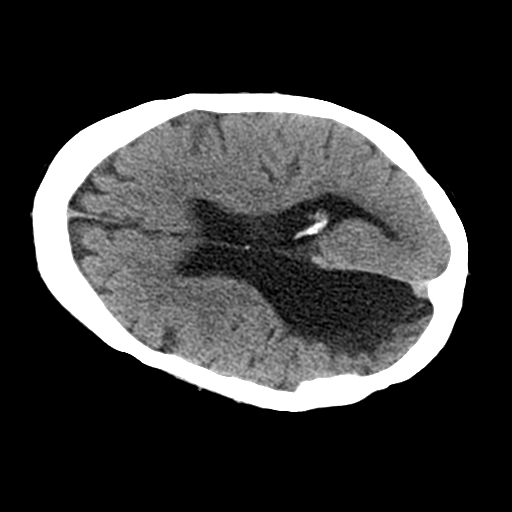
[im 26/37  brain]
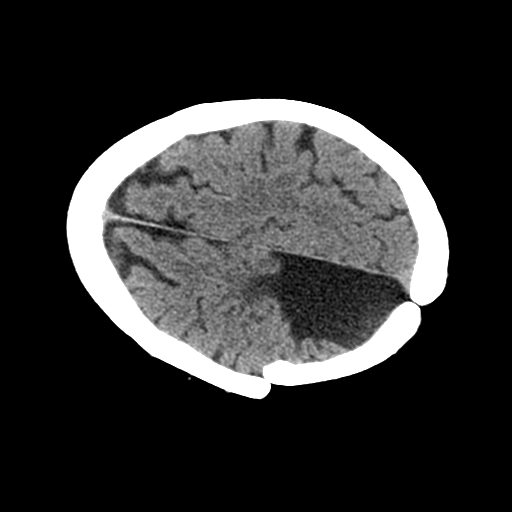
[im 26/37  bone]
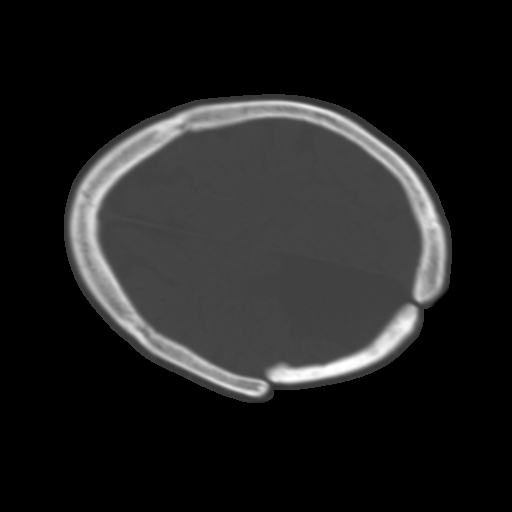
[im 31/37  brain]
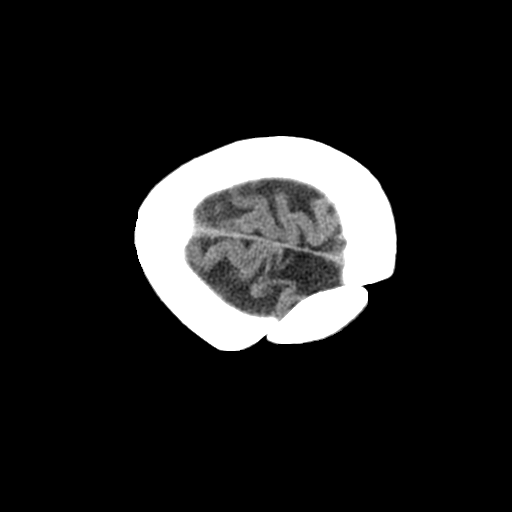

[Series 4: bone windows · axial · 0.45mm/px · z∈[+1046,+1096]mm · 3 of 37 slices shown (1 of 2)]
[im 6/37  bone]
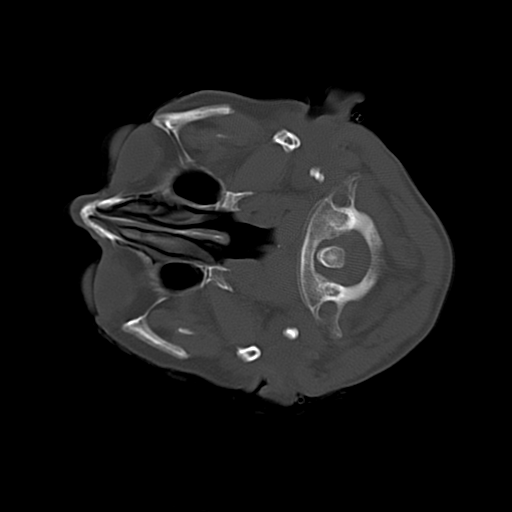
[im 11/37  bone]
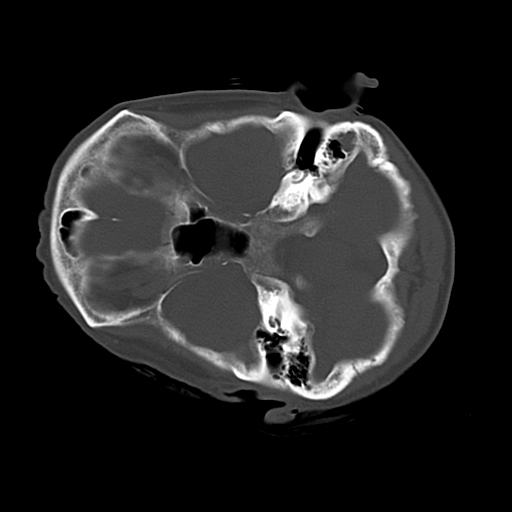
[im 16/37  bone]
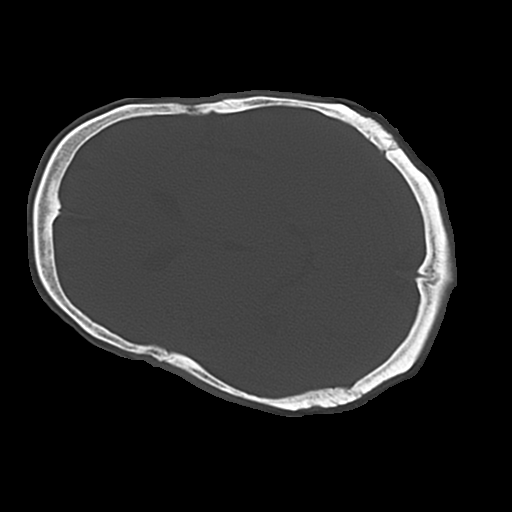

[Series 5: bone windows · axial · 0.45mm/px · z∈[+1033,+1186]mm · 8 of 61 slices shown (2 of 2)]
[im 5/61  bone]
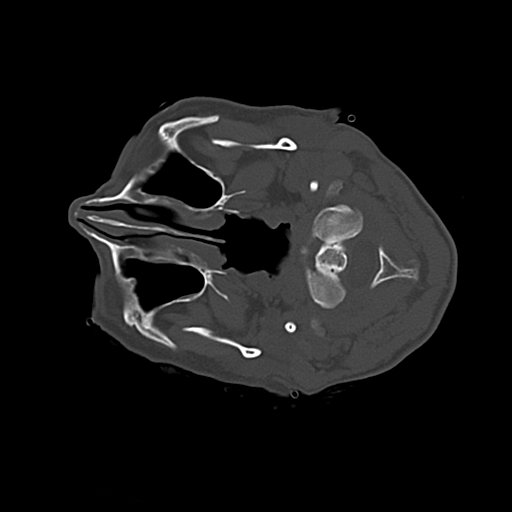
[im 14/61  bone]
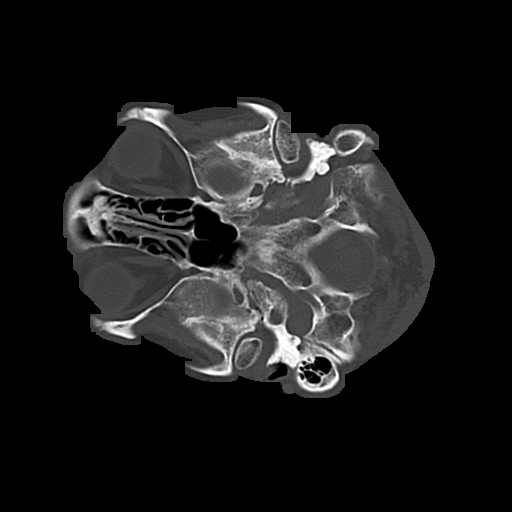
[im 19/61  bone]
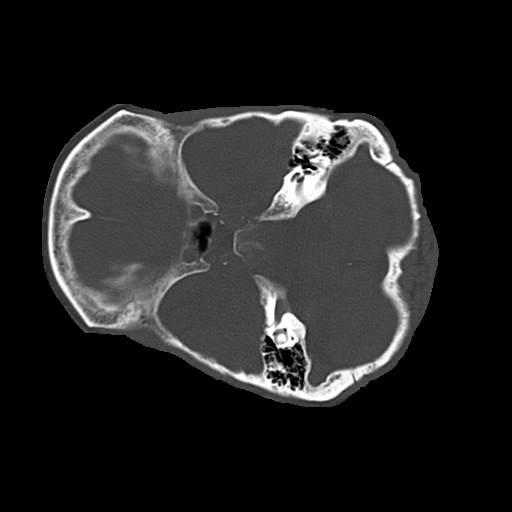
[im 28/61  bone]
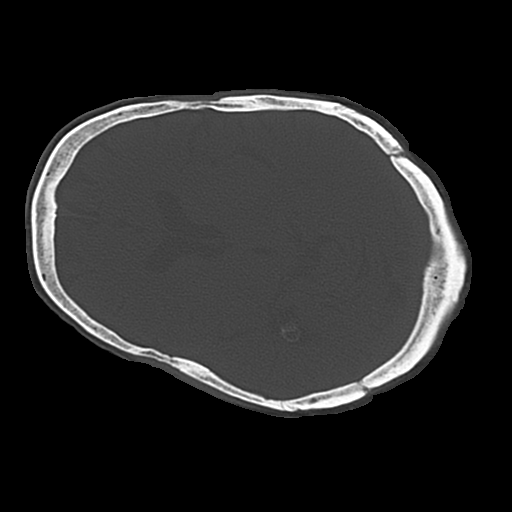
[im 33/61  bone]
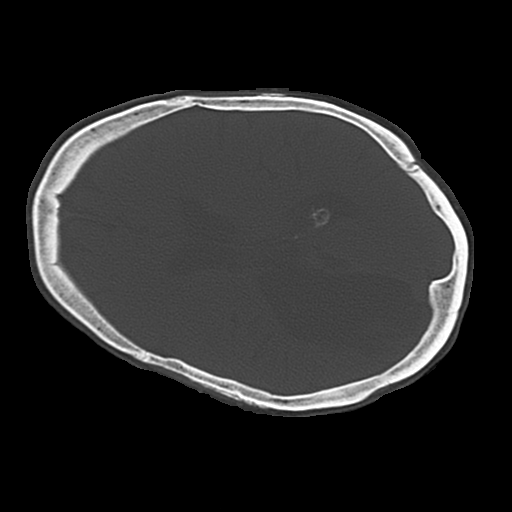
[im 42/61  bone]
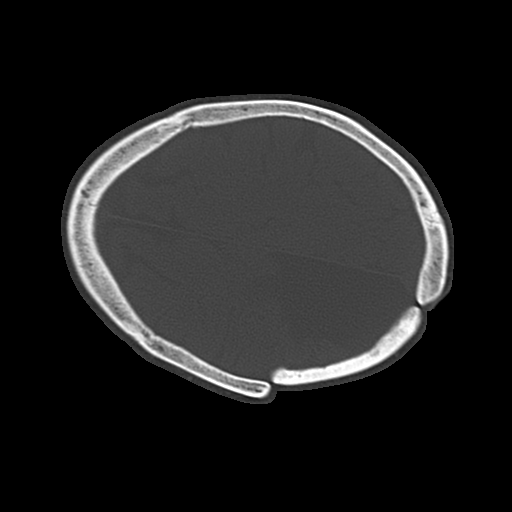
[im 47/61  bone]
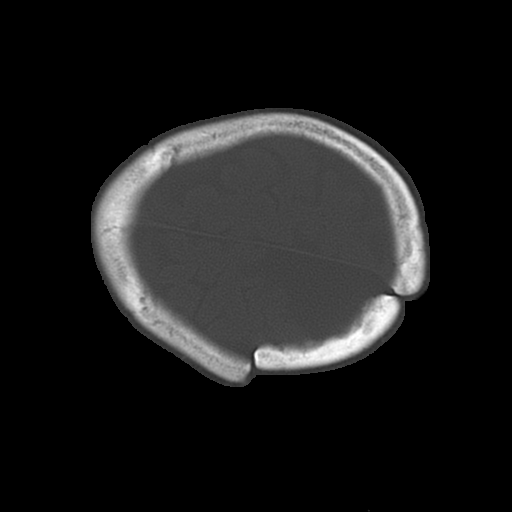
[im 56/61  bone]
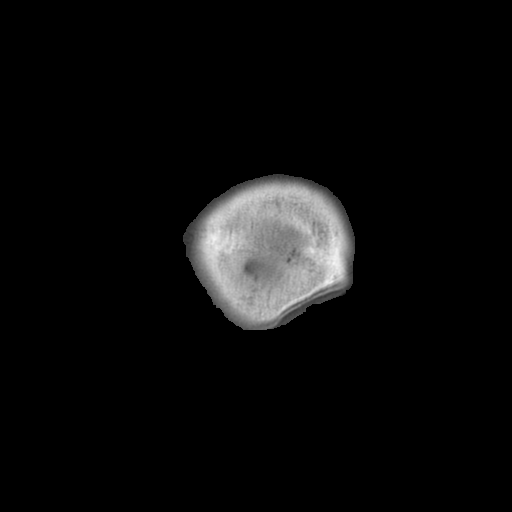

[17 of 30 positions shown; findings below may reference images not displayed]

FINDINGS: Postoperative changes with right posterior parietal craniotomy. Mild
depression of the anterior edge of the bone flap with respect to the
remainder the skull. Appearance is unchanged since prior study.
Underlying encephalomalacia in the right posterior parietal and
occipital region probably postoperative. Associated dilatation of
the posterior horn of the right lateral ventricle. Mild diffuse
cerebral atrophy. No significant white matter changes. No abnormal
extra-axial fluid collections. Gray-white matter junctions are
distinct. Basal cisterns are not effaced. No acute intracranial
hemorrhage. Visualized paranasal sinuses appear patent.
Opacification of some of the left mastoid air cells.
IMPRESSION: No acute intracranial abnormality. Large encephalomalacia in the
right posterior parietal/ occipital region with associated
craniotomy.

## 2017-02-17 ENCOUNTER — Encounter (HOSPITAL_COMMUNITY): Payer: Self-pay | Admitting: Emergency Medicine

## 2017-02-17 ENCOUNTER — Observation Stay (HOSPITAL_COMMUNITY)
Admission: EM | Admit: 2017-02-17 | Discharge: 2017-02-18 | Disposition: A | Discharge status: Home / Self Care | Payer: Medicare HMO | Attending: Family Medicine | Admitting: Family Medicine

## 2017-02-17 ENCOUNTER — Emergency Department (HOSPITAL_COMMUNITY): Payer: Medicare HMO

## 2017-02-17 DIAGNOSIS — M940 Chondrocostal junction syndrome [Tietze]: Secondary | ICD-10-CM

## 2017-02-17 DIAGNOSIS — F17210 Nicotine dependence, cigarettes, uncomplicated: Secondary | ICD-10-CM | POA: Insufficient documentation

## 2017-02-17 DIAGNOSIS — Z791 Long term (current) use of non-steroidal anti-inflammatories (NSAID): Secondary | ICD-10-CM | POA: Insufficient documentation

## 2017-02-17 DIAGNOSIS — R202 Paresthesia of skin: Secondary | ICD-10-CM | POA: Diagnosis not present

## 2017-02-17 DIAGNOSIS — G40909 Epilepsy, unspecified, not intractable, without status epilepticus: Secondary | ICD-10-CM | POA: Diagnosis not present

## 2017-02-17 DIAGNOSIS — F17200 Nicotine dependence, unspecified, uncomplicated: Secondary | ICD-10-CM | POA: Diagnosis not present

## 2017-02-17 DIAGNOSIS — I629 Nontraumatic intracranial hemorrhage, unspecified: Secondary | ICD-10-CM | POA: Diagnosis not present

## 2017-02-17 DIAGNOSIS — R0602 Shortness of breath: Secondary | ICD-10-CM | POA: Diagnosis present

## 2017-02-17 DIAGNOSIS — Z8673 Personal history of transient ischemic attack (TIA), and cerebral infarction without residual deficits: Secondary | ICD-10-CM | POA: Insufficient documentation

## 2017-02-17 DIAGNOSIS — J441 Chronic obstructive pulmonary disease with (acute) exacerbation: Secondary | ICD-10-CM | POA: Diagnosis not present

## 2017-02-17 DIAGNOSIS — R079 Chest pain, unspecified: Secondary | ICD-10-CM | POA: Diagnosis not present

## 2017-02-17 DIAGNOSIS — M7989 Other specified soft tissue disorders: Secondary | ICD-10-CM

## 2017-02-17 DIAGNOSIS — R0789 Other chest pain: Secondary | ICD-10-CM

## 2017-02-17 DIAGNOSIS — Z79899 Other long term (current) drug therapy: Secondary | ICD-10-CM | POA: Insufficient documentation

## 2017-02-17 DIAGNOSIS — K219 Gastro-esophageal reflux disease without esophagitis: Secondary | ICD-10-CM

## 2017-02-17 HISTORY — DX: Chondrocostal junction syndrome (tietze): M94.0

## 2017-02-17 HISTORY — DX: Other chest pain: R07.89

## 2017-02-17 LAB — D-DIMER, QUANTITATIVE: D-Dimer, Quant: 0.27 ug{FEU}/mL (ref 0.00–0.50)

## 2017-02-17 LAB — CBC WITH DIFFERENTIAL/PLATELET
Basophils Absolute: 0 K/uL (ref 0.0–0.1)
Basophils Relative: 0 %
Eosinophils Absolute: 0.1 K/uL (ref 0.0–0.7)
Eosinophils Relative: 1 %
HCT: 41.8 % (ref 36.0–46.0)
Hemoglobin: 13.7 g/dL (ref 12.0–15.0)
Lymphocytes Relative: 62 %
Lymphs Abs: 2.9 K/uL (ref 0.7–4.0)
MCH: 33.7 pg (ref 26.0–34.0)
MCHC: 32.8 g/dL (ref 30.0–36.0)
MCV: 102.7 fL — ABNORMAL HIGH (ref 78.0–100.0)
Monocytes Absolute: 0.3 K/uL (ref 0.1–1.0)
Monocytes Relative: 5 %
Neutro Abs: 1.5 K/uL — ABNORMAL LOW (ref 1.7–7.7)
Neutrophils Relative %: 32 %
Platelets: 149 K/uL — ABNORMAL LOW (ref 150–400)
RBC: 4.07 MIL/uL (ref 3.87–5.11)
RDW: 12.7 % (ref 11.5–15.5)
WBC: 4.8 K/uL (ref 4.0–10.5)

## 2017-02-17 LAB — URINALYSIS, ROUTINE W REFLEX MICROSCOPIC
BILIRUBIN URINE: NEGATIVE
Bacteria, UA: NONE SEEN
GLUCOSE, UA: NEGATIVE mg/dL
Hgb urine dipstick: NEGATIVE
KETONES UR: NEGATIVE mg/dL
NITRITE: NEGATIVE
PROTEIN: NEGATIVE mg/dL
Specific Gravity, Urine: 1.005 (ref 1.005–1.030)
pH: 7 (ref 5.0–8.0)

## 2017-02-17 LAB — TROPONIN I
Troponin I: <0.03 ng/mL (ref <0.03)
Troponin I: <0.03 ng/mL (ref <0.03)

## 2017-02-17 LAB — COMPREHENSIVE METABOLIC PANEL WITH GFR
ALT: 13 U/L — ABNORMAL LOW (ref 14–54)
AST: 19 U/L (ref 15–41)
Albumin: 3.8 g/dL (ref 3.5–5.0)
Alkaline Phosphatase: 58 U/L (ref 38–126)
Anion gap: 6 (ref 5–15)
BUN: 14 mg/dL (ref 6–20)
CO2: 27 mmol/L (ref 22–32)
Calcium: 8.7 mg/dL — ABNORMAL LOW (ref 8.9–10.3)
Chloride: 109 mmol/L (ref 101–111)
Creatinine, Ser: 0.76 mg/dL (ref 0.44–1.00)
GFR calc Af Amer: >60 mL/min (ref >60)
GFR calc non Af Amer: >60 mL/min (ref >60)
Glucose, Bld: 91 mg/dL (ref 65–99)
Potassium: 3.8 mmol/L (ref 3.5–5.1)
Sodium: 142 mmol/L (ref 135–145)
Total Bilirubin: 0.3 mg/dL (ref 0.3–1.2)
Total Protein: 6.1 g/dL — ABNORMAL LOW (ref 6.5–8.1)

## 2017-02-17 LAB — I-STAT TROPONIN, ED: TROPONIN I, POC: 0.02 ng/mL (ref 0.00–0.08)

## 2017-02-17 LAB — LIPASE, BLOOD: Lipase: 31 U/L (ref 11–51)

## 2017-02-17 LAB — VITAMIN B12: Vitamin B-12: 174 pg/mL — ABNORMAL LOW (ref 180–914)

## 2017-02-17 MED ORDER — IPRATROPIUM-ALBUTEROL 0.5-2.5 (3) MG/3ML IN SOLN
3.0000 mL | Freq: Once | RESPIRATORY_TRACT | Status: AC
  Administered 2017-02-17: 3 mL via RESPIRATORY_TRACT
  Filled 2017-02-17: qty 3

## 2017-02-17 MED ORDER — ENOXAPARIN SODIUM 40 MG/0.4ML ~~LOC~~ SOLN
40.0000 mg | SUBCUTANEOUS | Status: DC
  Administered 2017-02-17: 40 mg via SUBCUTANEOUS
  Filled 2017-02-17: qty 0.4

## 2017-02-17 MED ORDER — SODIUM CHLORIDE 0.9 % IV SOLN
INTRAVENOUS | Status: DC
  Administered 2017-02-17 (×2): via INTRAVENOUS

## 2017-02-17 MED ORDER — FENTANYL CITRATE (PF) 100 MCG/2ML IJ SOLN
50.0000 ug | Freq: Once | INTRAMUSCULAR | Status: AC
  Administered 2017-02-17: 50 ug via INTRAVENOUS
  Filled 2017-02-17: qty 2

## 2017-02-17 MED ORDER — PHENYTOIN SODIUM EXTENDED 30 MG PO CAPS
160.0000 mg | ORAL_CAPSULE | Freq: Every day | ORAL | Status: DC
  Administered 2017-02-17 – 2017-02-18 (×2): 160 mg via ORAL
  Filled 2017-02-17 (×2): qty 2

## 2017-02-17 MED ORDER — METHYLPREDNISOLONE SODIUM SUCC 125 MG IJ SOLR
125.0000 mg | Freq: Once | INTRAMUSCULAR | Status: AC
  Administered 2017-02-17: 125 mg via INTRAVENOUS
  Filled 2017-02-17: qty 2

## 2017-02-17 MED ORDER — NICOTINE 7 MG/24HR TD PT24
7.0000 mg | MEDICATED_PATCH | Freq: Every day | TRANSDERMAL | Status: DC
  Administered 2017-02-17 – 2017-02-18 (×2): 7 mg via TRANSDERMAL
  Filled 2017-02-17 (×2): qty 1

## 2017-02-17 MED ORDER — SUCRALFATE 1 GM/10ML PO SUSP
1.0000 g | Freq: Three times a day (TID) | ORAL | Status: AC
  Administered 2017-02-17 – 2017-02-18 (×2): 1 g via ORAL
  Filled 2017-02-17 (×5): qty 10

## 2017-02-17 MED ORDER — DICLOFENAC SODIUM 1 % TD GEL
4.0000 g | Freq: Four times a day (QID) | TRANSDERMAL | Status: DC | PRN
  Filled 2017-02-17: qty 100

## 2017-02-17 MED ORDER — GI COCKTAIL ~~LOC~~
30.0000 mL | Freq: Four times a day (QID) | ORAL | Status: DC | PRN
  Administered 2017-02-17: 30 mL via ORAL
  Filled 2017-02-17: qty 30

## 2017-02-17 MED ORDER — ACETAMINOPHEN 500 MG PO TABS
1000.0000 mg | ORAL_TABLET | Freq: Once | ORAL | Status: AC
  Administered 2017-02-17: 1000 mg via ORAL
  Filled 2017-02-17: qty 2

## 2017-02-17 MED ORDER — PHENYTOIN SODIUM EXTENDED 30 MG PO CAPS: 60.0000 mg | ORAL_CAPSULE | Freq: Every day | ORAL | Status: DC

## 2017-02-17 MED ORDER — PANTOPRAZOLE SODIUM 40 MG PO TBEC: 40.0000 mg | DELAYED_RELEASE_TABLET | Freq: Two times a day (BID) | ORAL | Status: DC

## 2017-02-17 MED ORDER — IBUPROFEN 600 MG PO TABS: 600.0000 mg | ORAL_TABLET | Freq: Four times a day (QID) | ORAL | Status: DC | PRN

## 2017-02-17 MED ORDER — ONDANSETRON HCL 4 MG/2ML IJ SOLN: 4.0000 mg | Freq: Four times a day (QID) | INTRAMUSCULAR | Status: DC | PRN

## 2017-02-17 MED ORDER — GUAIFENESIN ER 600 MG PO TB12: 1200.0000 mg | ORAL_TABLET | Freq: Two times a day (BID) | ORAL | Status: DC | PRN

## 2017-02-17 MED ORDER — ACETAMINOPHEN 500 MG PO TABS
1000.0000 mg | ORAL_TABLET | Freq: Four times a day (QID) | ORAL | Status: DC | PRN
  Administered 2017-02-17 – 2017-02-18 (×2): 1000 mg via ORAL
  Filled 2017-02-17 (×2): qty 2

## 2017-02-17 MED ORDER — LACOSAMIDE 50 MG PO TABS
200.0000 mg | ORAL_TABLET | Freq: Two times a day (BID) | ORAL | Status: DC
  Administered 2017-02-17 – 2017-02-18 (×3): 200 mg via ORAL
  Filled 2017-02-17 (×3): qty 4

## 2017-02-17 MED ORDER — PHENYTOIN SODIUM EXTENDED 100 MG PO CAPS
200.0000 mg | ORAL_CAPSULE | Freq: Every day | ORAL | Status: DC
  Administered 2017-02-17: 200 mg via ORAL
  Filled 2017-02-17 (×2): qty 2

## 2017-02-17 MED ORDER — PANTOPRAZOLE SODIUM 40 MG PO TBEC
40.0000 mg | DELAYED_RELEASE_TABLET | Freq: Two times a day (BID) | ORAL | Status: DC
  Administered 2017-02-17 – 2017-02-18 (×3): 40 mg via ORAL
  Filled 2017-02-17 (×3): qty 1

## 2017-02-17 MED ORDER — SODIUM CHLORIDE 0.9 % IV BOLUS (SEPSIS)
1000.0000 mL | Freq: Once | INTRAVENOUS | Status: AC
  Administered 2017-02-17: 1000 mL via INTRAVENOUS

## 2017-02-17 MED ORDER — BENZONATATE 100 MG PO CAPS
200.0000 mg | ORAL_CAPSULE | Freq: Three times a day (TID) | ORAL | Status: DC | PRN
  Administered 2017-02-17: 200 mg via ORAL
  Filled 2017-02-17: qty 2

## 2017-02-17 MED ORDER — ONDANSETRON HCL 4 MG PO TABS: 4.0000 mg | ORAL_TABLET | Freq: Four times a day (QID) | ORAL | Status: DC | PRN

## 2017-02-17 MED ORDER — PREDNISONE 20 MG PO TABS
50.0000 mg | ORAL_TABLET | Freq: Every day | ORAL | Status: DC
  Administered 2017-02-18: 50 mg via ORAL
  Filled 2017-02-17: qty 1

## 2017-02-17 NOTE — ED Triage Notes (Signed)
Pt in from home via [facility] with c/o sob, epigastric/chest pain since waking at 0330 this am. Sats 89% on room air on their arrival. EMS gave 324 ASA, 1 albuterol, 1 atrovent en route. Pt also c/o bilateral lower leg tingling. Hx of seizures, cerebral hemorrhage, COPD, current smoker.

## 2017-02-17 NOTE — H&P (Signed)
History and Physical    [name] WUJ:[id number] DOB: 1956-02-04 DOA: 02/17/2017  PCP: [name], MD Patient coming from: Home  Chief Complaint: CHest tightness and LE swelling w/ numbness.   HPI: [name] is a 61 y.o. female with medical history significant of asthma, intracranial hemorrhage status post craniotomy with chronic headaches and seizures, nephrolithiasis, DVT, COPD, chronic back pain.  Patient describes waking up suddenly at approximately 03:00 with chest tightness and a sensation of needing to gasp for air.. Located throughout her chest without specific focal location. States that it felt like a "bolt "going through her chest. Some radiation to the back. Associated with shortness of breath. Pain is worse with deep inspiration and palpation of chest wall. No worsening with exertion. Patient continues to smoke 3-4 cigarettes per day. Endorses occasional cough that is nonproductive. EMS was called emergently to the patient's house she was noted to be wheezing with an O2 saturation of 89% on room air. Patient was given a breathing treatment without improvement in her subjective symptoms. Patient has never had any similar chest pain. Currently patient states that her chest pain persists but her respiratory symptoms are markedly improved. Denies any recent prolonged travel, recent illnesses that would necessitate her to be bedbound, or estrogen replacement therapy. Patient endorses having approximately 1-2 seizures per month at baseline. Patient states that over the last 3-4 days she has had a sensation that her legs are swollen and has had some numbness and tingling in her legs bilaterally. Left is worse than right.  ED Course: Objective findings outlined below. Given duo nebulizer treatments 2, 125 mg aside Medrol, 1 L normal saline bolus, and fentanyl with improvement in overall symptoms.  Review of Systems: As per HPI otherwise all other systems reviewed and are  negative  Ambulatory Status: No restrictions. Unable to drive due to seizures.  Past Medical History:  Diagnosis Date  . Asthma   . Cerebral hemorrhage ([place]) 1989  . Chronic back pain   . Chronic bronchitis ([place])    "gets it q yr"  . COPD (chronic obstructive pulmonary disease) ([place])   . DDD (degenerative disc disease) 06/12/2013  . DVT (deep venous thrombosis) ([place])    "she's had several since 1990"  . GERD (gastroesophageal reflux disease)   . Headache    "usually around time when she's had a seizure"  . History of blood transfusion    "when she was a baby"  . History of stomach ulcers   . Kidney stones   . Seizures ([place])    "because of her brain surgery"  . Stroke [facility]) [phone]   family denies residual on 11/09/2014  . Tunnel vision    "since brain OR"    Past Surgical History:  Procedure Laterality Date  . [place]   craniotomy with hematoma evacuation  . CESAREAN SECTION  1979; 1987; 1989  . HEMORRHOID SURGERY    . TUBAL LIGATION  1990's    Social History   Social History  . Marital status: Divorced    Spouse name: N/A  . Number of children: N/A  . Years of education: N/A   Occupational History  . Not on file.   Social History Main Topics  . Smoking status: Current Every Day Smoker    Packs/day: 1.50    Years: 40.00    Types: Cigarettes  . Smokeless tobacco: Never Used  . Alcohol use No  . Drug use: No  . Sexual activity: No  Other Topics Concern  . Not on file   Social History Narrative  . No narrative on file    Allergies  Allergen Reactions  . Zonisamide Other (See Comments)    Numbness and tingling over whole body  . Codeine Nausea And Vomiting  . Keppra [Levetiracetam] Other (See Comments)    Causes seizures.   . Pregabalin Nausea And Vomiting  . Olive Oil Rash    Family History  Problem Relation Age of Onset  . Heart attack Mother   . Heart attack Father       Prior to Admission medications   Medication Sig Start  Date End Date Taking? Authorizing Provider  lacosamide (VIMPAT) 200 MG TABS tablet Take 200 mg by mouth 2 (two) times daily.   Yes [provider]  phenytoin (DILANTIN) 100 MG ER capsule Take 100 mg every morning and 200 mg at bedtime. Must get a Dilantin level checked by her primary care physician within a week Patient taking differently: Take 100-200 mg by mouth See admin instructions. Take 1 capsule every morning with 2 capsules of 30 mg to equal 160 and take 2 capsules at bedtime. 11/04/15  Yes Charlynne Cousins, MD  phenytoin (DILANTIN) 30 MG ER capsule Take 60 mg by mouth daily. Take with 100 mg to equal 160 mg   Yes [provider]  feeding supplement, ENSURE ENLIVE, (ENSURE ENLIVE) LIQD Take 237 mLs by mouth 2 (two) times daily between meals. Patient not taking: Reported on 06/10/2016 04/01/15   Cristal Ford, DO  ibuprofen (ADVIL,MOTRIN) 400 MG tablet Take 1 tablet (400 mg total) by mouth every 4 (four) hours as needed for fever or moderate pain. Patient not taking: Reported on 06/10/2016 11/04/15   Charlynne Cousins, MD  oxyCODONE (ROXICODONE) 5 MG immediate release tablet Take 1 tablet (5 mg total) by mouth every 4 (four) hours as needed for severe pain. Patient not taking: Reported on 06/10/2016 11/04/15   Charlynne Cousins, MD  pantoprazole (PROTONIX) 40 MG tablet Take 1 tablet (40 mg total) by mouth 2 (two) times daily. Patient not taking: Reported on 06/10/2016 11/04/15   Charlynne Cousins, MD    Physical Exam: Vitals:   02/17/17 0550 02/17/17 0700 02/17/17 0715 02/17/17 0730  BP: 102/64 (!) 94/46 (!) 90/53 (!) 93/52  Pulse: 92 87 92 86  Resp: (!) 31 (!) 22 (!) 21 19  Temp: 98.3 F (36.8 C)     TempSrc: Oral     SpO2: 100% 100% 100% 100%  Weight:      Height:         General: Frail appearing, resting comfortably in bed. Eyes:  PERRL, EOMI, normal lids, iris ENT:  grossly normal hearing, lips & tongue, mmm Neck:  no LAD, masses or  thyromegaly Cardiovascular:  RRR, no m/r/g. Trace-1+ left lower extremity swelling with trace right lower extremity swelling.  Respiratory: A few intermittent wheezes with inspiration and expiration. Normal effort. No rhonchi or crackles. Abdomen:  soft, ntnd, NABS Skin:  no rash or induration seen on limited exam Musculoskeletal:  grossly normal tone BUE/BLE, good ROM, no bony abnormality Psychiatric:  grossly normal mood and affect, speech fluent and appropriate, AOx3 Neurologic:  CN 2-12 grossly intact, moves all extremities in coordinated fashion, sensation intact  Labs on Admission: I have personally reviewed following labs and imaging studies  CBC:  Recent Labs Lab 02/17/17 0527  WBC 4.8  NEUTROABS 1.5*  HGB 13.7  HCT 41.8  MCV 102.7*  PLT 413*   Basic Metabolic Panel:  Recent Labs Lab 02/17/17 0527  NA 142  K 3.8  CL 109  CO2 27  GLUCOSE 91  BUN 14  CREATININE 0.76  CALCIUM 8.7*   GFR: Estimated Creatinine Clearance: 70.3 mL/min (by C-G formula based on SCr of 0.76 mg/dL). Liver Function Tests:  Recent Labs Lab 02/17/17 0527  AST 19  ALT 13*  ALKPHOS 58  BILITOT 0.3  PROT 6.1*  ALBUMIN 3.8    Recent Labs Lab 02/17/17 0527  LIPASE 31   No results for input(s): AMMONIA in the last 168 hours. Coagulation Profile: No results for input(s): INR, PROTIME in the last 168 hours. Cardiac Enzymes: No results for input(s): CKTOTAL, CKMB, CKMBINDEX, TROPONINI in the last 168 hours. BNP (last 3 results) No results for input(s): PROBNP in the last 8760 hours. HbA1C: No results for input(s): HGBA1C in the last 72 hours. CBG: No results for input(s): GLUCAP in the last 168 hours. Lipid Profile: No results for input(s): CHOL, HDL, LDLCALC, TRIG, CHOLHDL, LDLDIRECT in the last 72 hours. Thyroid Function Tests: No results for input(s): TSH, T4TOTAL, FREET4, T3FREE, THYROIDAB in the last 72 hours. Anemia Panel: No results for input(s): VITAMINB12, FOLATE,  FERRITIN, TIBC, IRON, RETICCTPCT in the last 72 hours. Urine analysis:    Component Value Date/Time   COLORURINE AMBER (A) 11/01/2015 2340   APPEARANCEUR CLEAR 11/01/2015 2340   LABSPEC 1.027 11/01/2015 2340   PHURINE 6.0 11/01/2015 2340   GLUCOSEU NEGATIVE 11/01/2015 2340   HGBUR NEGATIVE 11/01/2015 2340   BILIRUBINUR SMALL (A) 11/01/2015 2340   KETONESUR NEGATIVE 11/01/2015 2340   PROTEINUR NEGATIVE 11/01/2015 2340   UROBILINOGEN 0.2 01/17/2015 2350   NITRITE NEGATIVE 11/01/2015 2340   LEUKOCYTESUR SMALL (A) 11/01/2015 2340    Creatinine Clearance: Estimated Creatinine Clearance: 70.3 mL/min (by C-G formula based on SCr of 0.76 mg/dL).  Sepsis Labs: @LABRCNTIP (procalcitonin:4,lacticidven:4) )No results found for this or any previous visit (from the past 240 hour(s)).   Radiological Exams on Admission: Dg Chest Portable 1 View  Result Date: 02/17/2017 CLINICAL DATA:  Mid chest pain, tachycardia. History of COPD, chronic bronchitis, asthma. EXAM: PORTABLE CHEST 1 VIEW COMPARISON:  Chest radiograph November 01, 2015 FINDINGS: Cardiomediastinal silhouette is normal. Mildly calcified aortic arch. Increased lung volumes and similar mild chronic interstitial changes without pleural effusion or focal consolidation. No pneumothorax. Old RIGHT clavicle fracture. Old RIGHT rib fractures. Soft tissue planes are nonsuspicious. IMPRESSION: COPD. Electronically Signed   By: Elon Alas M.D.   On: 02/17/2017 06:02    EKG: Independently reviewed. Sinus, no ACS  Assessment/Plan Active Problems:   Seizure disorder (HCC)   History of Intracranial bleed   Tobacco dependence   Paresthesias   COPD exacerbation (HCC)   Chest pain   Atypical chest pain   Localized swelling of lower extremity   GERD (gastroesophageal reflux disease)   CP: Doubt ACS. HEART score 4. Suspect this is due to pleuritic pain from mild COPD exacerbation in conjunction with costochondritis. Patient's costochondral  border is exquisitely tender to palpation. EKG without signs of ACS. Troponin normal. Nonexertional. Doubt pulmonary embolism due to normal d-dimer but continues to be a differential due to lower extremity complaints as outlined below. - Cycle troponin - EKG in a.m. - Follow-up with outpatient cardiology - Steroid therapy for COPD will likely help with costochondritis - Voltaren gel for additional costochondral irritation - GI cocktail  COPD exacerbation: Documented hypoxemia with O2 saturation of 89% and respiratory distress at  time of EMS arrival patient's home. Improved after neurologic treatments and Solu-Medrol. - DuoNeb every 424 hours - Prednisone 60 mg daily - O2 when necessary - Tessalon, Mucinex - Consider antibiotics if condition worsens  Lower extremity tightness/numbness: Trace to 1+ left lower extremity edema with trace right lower extremity pitting edema. Patient states is been ongoing for 3-4 days but does have a history of vitamin deficiency causing neuropathy. D-dimer negative. Does have a history of DVT. - Lower extremity duplex - B12, folate, vitamin D levels - Dilantin level (h/o dilantin toxicity in the past) - start neurontin if not improving or simply have pt f/u w/ Neurology as outpt.   Seizures: Patient has approximately 1-2 per month ever since sustaining intracranial hemorrhage in the 1990s. Patient states this is at baseline. - continue Dilantin and Vimpat  GERD: not taking PPI. CP may be related to reflux esophagitis - carafate x24hrs - PPI  DVT prophylaxis: Lovenox  Code Status: full  Family Communication: none  Disposition Plan: pending CP r/o   Consults called: none  Admission status: observation    Seline Enzor J MD [facility]  If 7PM-7AM, please contact night-coverage [email or web address] Password [facility]  02/17/2017, 8:24 AM

## 2017-02-17 NOTE — ED Notes (Signed)
Pt woken from sleep and states papin unchanged. At chest and legs.

## 2017-02-17 NOTE — ED Provider Notes (Signed)
TIME SEEN: 5:24 AM  CHIEF COMPLAINT: Chest pain, shortness of breath, diarrhea, leg tightness  HPI: Patient is a 61 year old female with history of tobacco use, COPD, DVT, intracranial hemorrhage, CVA who presents emergency department with multiple complaints. Her first complaint is that she woke up suddenly from sleep with diffuse chest tightness and felt like a "bolt" was going through her chest into her back. She felt short of breath with this. She called 911. On EMS arrival patient was found to be wheezing and what had oxygen saturations of 89% on room air. She does not oxygen chronically. She was given breathing treatments but states that this did not help her symptoms. She states she's never had anything like this before. Reports she has had a stress test many years ago. No history of cardiac catheterization. She denies history of pulmonary embolus. No aggravating or relieving factors. No associated nausea, vomiting, dizziness or diaphoresis. She reports her father died at age 15 from a heart attack.   Patient also complains of several days of diarrhea. No nausea or vomiting. She has no abdominal pain.   Patient is also concerned that she has a skin tag and feels like her skin is not as smooth as it normally is.   Patient is also complaining of feeling like both of her legs are tight and cramping. No leg swelling. She states she did have some numbness in both legs from her knees down and also in her arms from her elbows down but this has improved. No bowel or bladder incontinence. No urinary retention. Has chronic back pain after a lumbar fracture over 6 months ago. No other neck or back pain. No other neck or back injury. No head injury. No headache.  ROS: See HPI Constitutional: no fever  Eyes: no drainage  ENT: no runny nose   Cardiovascular:   chest pain  Resp:  SOB  GI: no vomiting GU: no dysuria Integumentary: no rash  Allergy: no hives  Musculoskeletal: no leg swelling   Neurological: no slurred speech ROS otherwise negative  PAST MEDICAL HISTORY/PAST SURGICAL HISTORY:  Past Medical History:  Diagnosis Date  . Asthma   . Cerebral hemorrhage ([place]) 1989  . Chronic back pain   . Chronic bronchitis ([place])    "gets it q yr"  . COPD (chronic obstructive pulmonary disease) ([place])   . DDD (degenerative disc disease) 06/12/2013  . DVT (deep venous thrombosis) ([place])    "she's had several since 1990"  . GERD (gastroesophageal reflux disease)   . Headache    "usually around time when she's had a seizure"  . History of blood transfusion    "when she was a baby"  . History of stomach ulcers   . Kidney stones   . Seizures ([place])    "because of her brain surgery"  . Stroke [facility]) [phone]   family denies residual on 11/09/2014  . Tunnel vision    "since brain OR"    MEDICATIONS:  Prior to Admission medications   Medication Sig Start Date End Date Taking? Authorizing Provider  feeding supplement, ENSURE ENLIVE, (ENSURE ENLIVE) LIQD Take 237 mLs by mouth 2 (two) times daily between meals. Patient not taking: Reported on 06/10/2016 04/01/15   Cristal Ford, DO  ibuprofen (ADVIL,MOTRIN) 400 MG tablet Take 1 tablet (400 mg total) by mouth every 4 (four) hours as needed for fever or moderate pain. Patient not taking: Reported on 06/10/2016 11/04/15   Charlynne Cousins, MD  lacosamide (VIMPAT) 200 MG TABS  tablet Take 200 mg by mouth 2 (two) times daily.    [provider]  oxyCODONE (ROXICODONE) 5 MG immediate release tablet Take 1 tablet (5 mg total) by mouth every 4 (four) hours as needed for severe pain. Patient not taking: Reported on 06/10/2016 11/04/15   Charlynne Cousins, MD  pantoprazole (PROTONIX) 40 MG tablet Take 1 tablet (40 mg total) by mouth 2 (two) times daily. Patient not taking: Reported on 06/10/2016 11/04/15   Charlynne Cousins, MD  phenytoin (DILANTIN) 100 MG ER capsule Take 100 mg every morning and 200 mg at bedtime. Must get a Dilantin  level checked by her primary care physician within a week Patient taking differently: Take 100-200 mg by mouth 2 (two) times daily. Take 1 capsule every morning with 2 capsules of 30 mg to equal 160 and take 2 capsules at bedtime. 11/04/15   Charlynne Cousins, MD  phenytoin (DILANTIN) 30 MG ER capsule Take 60 mg by mouth daily. Take with 100 mg to equal 160 mg    [provider]    ALLERGIES:  Allergies  Allergen Reactions  . Zonisamide Other (See Comments)    Numbness and tingling over whole body  . Codeine Nausea And Vomiting  . Keppra [Levetiracetam] Other (See Comments)    Causes seizures.   . Pregabalin Nausea And Vomiting  . Olive Oil Rash    SOCIAL HISTORY:  Social History  Substance Use Topics  . Smoking status: Current Every Day Smoker    Packs/day: 1.50    Years: 40.00    Types: Cigarettes  . Smokeless tobacco: Never Used  . Alcohol use No    FAMILY HISTORY: Family History  Problem Relation Age of Onset  . Heart attack Mother   . Heart attack Father     EXAM: BP 102/64 (BP Location: Right Arm)   Pulse 92   Temp 98.3 F (36.8 C) (Oral)   Resp (!) 31   Ht 5\' 7"  (1.702 m)   Wt 58.1 kg (128 lb)   SpO2 100%   BMI 20.05 kg/m  CONSTITUTIONAL: Alert and oriented and responds appropriately to questions. Thin, chronically ill-appearing, appears anxious HEAD: Normocephalic EYES: Conjunctivae clear, pupils appear equal, EOMI ENT: normal nose; moist mucous membranes NECK: Supple, no meningismus, no nuchal rigidity, no LAD  CARD: RRR; S1 and S2 appreciated; no murmurs, no clicks, no rubs, no gallops RESP: Normal chest excursion without splinting or tachypnea; breath sounds equal bilaterally, mild scattered expiratory wheezes appreciated, only diminished aeration at bases bilaterally, no rhonchi, no rales, no hypoxia or respiratory distress, speaking full sentences ABD/GI: Normal bowel sounds; non-distended; soft, non-tender, no rebound, no guarding, no  peritoneal signs, no hepatosplenomegaly BACK:  The back appears normal and is non-tender to palpation, there is no CVA tenderness EXT: Normal ROM in all joints; non-tender to palpation; no edema; normal capillary refill; no cyanosis, no calf tenderness or swelling, 2+ DP pulses bilaterally    SKIN: Normal color for age and race; warm; no rash NEURO: Moves all extremities equally, sensation to light touch intact diffusely, strength 5/5 in all 4 extremities, cranial nerves II through XII intact, normal speech, no saddle anesthesia PSYCH: The patient's mood and manner are appropriate. Grooming and personal hygiene are appropriate.  MEDICAL DECISION MAKING: Patient here with complaints of chest pain and shortness of breath. Patient states this feels nothing like her COPD exacerbations and states that breathing treatments did not help her. She received aspirin with EMS. Unfortunately her  blood pressure slightly low and I cannot give her nitroglycerin. Will give her fentanyl for pain. He does have some scant wheezing on exam but nothing significant and no hypoxia or increased work of breathing. We'll give Solu-Medrol, DuoNeb and reassess.   Patient also complaining of several days of diarrhea. Nonbloody. Abdominal exam benign. No vomiting. We'll monitor this.   Patient also complaining of feeling like her arms and legs went numb today. It occurred bilaterally. Doubt stroke because it was bilateral. Numbness has improved but now she has tightness in her legs. She has chronic back pain after an injury after a seizure 6 months ago. No new injury. No new back pain. No focal neurologic deficits on exam. I do not feel this time she needs emergent imaging of her back. She does report a history of DVT but there is no Tenderness or swelling exam to suggest this. Her extremities are warm and well-perfused without any sign of bony injury, infection.   We'll obtain cardiac labs including d-dimer given her history of  DVT. Will obtain chest x-ray. Patient may need admission for chest pain rule out. Her heart score is 4.    ED PROGRESS: Patient's labs are unremarkable. Her first troponin is negative. D-dimer negative. Chest x-ray shows findings consistent with COPD. No pulmonary edema. We'll discuss with medicine for admission. Her PCP is Dr. Maudie Mercury. She has not had any stress test, catheterization recently.    6:25 AM Discussed patient's case with hospitalist, Dr. Olevia Bowens.  I have recommended admission and patient (and family if present) agree with this plan. Admitting physician will place admission orders.   I reviewed all nursing notes, vitals, pertinent previous records, EKGs, lab and urine results, imaging (as available).     EKG Interpretation  Date/Time:  Tuesday February 17 2017 05:32:28 EDT Ventricular Rate:  89 PR Interval:    QRS Duration: 101 QT Interval:  390 QTC Calculation: 472 R Axis:   83 Text Interpretation:  Sinus rhythm Ventricular premature complex Confirmed by Janney Priego, Cyril Mourning [phone]) on 02/17/2017 5:35:07 AM         Daren Yeagle, Delice Bison, DO 02/17/17 8280

## 2017-02-18 ENCOUNTER — Observation Stay (HOSPITAL_BASED_OUTPATIENT_CLINIC_OR_DEPARTMENT_OTHER): Payer: Medicare HMO

## 2017-02-18 ENCOUNTER — Encounter (HOSPITAL_COMMUNITY): Payer: Self-pay | Admitting: Physician Assistant

## 2017-02-18 DIAGNOSIS — M940 Chondrocostal junction syndrome [Tietze]: Secondary | ICD-10-CM | POA: Diagnosis not present

## 2017-02-18 DIAGNOSIS — Z8673 Personal history of transient ischemic attack (TIA), and cerebral infarction without residual deficits: Secondary | ICD-10-CM | POA: Diagnosis not present

## 2017-02-18 DIAGNOSIS — F17200 Nicotine dependence, unspecified, uncomplicated: Secondary | ICD-10-CM | POA: Diagnosis not present

## 2017-02-18 DIAGNOSIS — Z791 Long term (current) use of non-steroidal anti-inflammatories (NSAID): Secondary | ICD-10-CM | POA: Diagnosis not present

## 2017-02-18 DIAGNOSIS — K219 Gastro-esophageal reflux disease without esophagitis: Secondary | ICD-10-CM

## 2017-02-18 DIAGNOSIS — R0789 Other chest pain: Secondary | ICD-10-CM | POA: Diagnosis not present

## 2017-02-18 DIAGNOSIS — G40909 Epilepsy, unspecified, not intractable, without status epilepticus: Secondary | ICD-10-CM | POA: Diagnosis not present

## 2017-02-18 DIAGNOSIS — R202 Paresthesia of skin: Secondary | ICD-10-CM

## 2017-02-18 DIAGNOSIS — R079 Chest pain, unspecified: Secondary | ICD-10-CM | POA: Diagnosis not present

## 2017-02-18 DIAGNOSIS — M7989 Other specified soft tissue disorders: Secondary | ICD-10-CM

## 2017-02-18 DIAGNOSIS — J441 Chronic obstructive pulmonary disease with (acute) exacerbation: Principal | ICD-10-CM

## 2017-02-18 DIAGNOSIS — F17210 Nicotine dependence, cigarettes, uncomplicated: Secondary | ICD-10-CM | POA: Diagnosis not present

## 2017-02-18 DIAGNOSIS — Z79899 Other long term (current) drug therapy: Secondary | ICD-10-CM | POA: Diagnosis not present

## 2017-02-18 LAB — BASIC METABOLIC PANEL
ANION GAP: 4 — AB (ref 5–15)
BUN: 8 mg/dL (ref 6–20)
CALCIUM: 8.1 mg/dL — AB (ref 8.9–10.3)
CO2: 26 mmol/L (ref 22–32)
CREATININE: 0.63 mg/dL (ref 0.44–1.00)
Chloride: 111 mmol/L (ref 101–111)
GLUCOSE: 87 mg/dL (ref 65–99)
Potassium: 3.5 mmol/L (ref 3.5–5.1)
Sodium: 141 mmol/L (ref 135–145)

## 2017-02-18 LAB — CBC
HEMATOCRIT: 35.1 % — AB (ref 36.0–46.0)
Hemoglobin: 11.6 g/dL — ABNORMAL LOW (ref 12.0–15.0)
MCH: 34.1 pg — ABNORMAL HIGH (ref 26.0–34.0)
MCHC: 33 g/dL (ref 30.0–36.0)
MCV: 103.2 fL — AB (ref 78.0–100.0)
PLATELETS: 122 10*3/uL — AB (ref 150–400)
RBC: 3.4 MIL/uL — ABNORMAL LOW (ref 3.87–5.11)
RDW: 13.2 % (ref 11.5–15.5)
WBC: 4.2 10*3/uL (ref 4.0–10.5)

## 2017-02-18 LAB — FOLATE RBC
FOLATE, HEMOLYSATE: 332.8 ng/mL
FOLATE, RBC: 864 ng/mL (ref >498)
HEMATOCRIT: 38.5 % (ref 34.0–46.6)

## 2017-02-18 LAB — VITAMIN D 25 HYDROXY (VIT D DEFICIENCY, FRACTURES): Vit D, 25-Hydroxy: 9.1 ng/mL — ABNORMAL LOW (ref 30.0–100.0)

## 2017-02-18 LAB — PHENYTOIN LEVEL, TOTAL: Phenytoin Lvl: 27 ug/mL — ABNORMAL HIGH (ref 10.0–20.0)

## 2017-02-18 LAB — HIV ANTIBODY (ROUTINE TESTING W REFLEX): HIV SCREEN 4TH GENERATION: NONREACTIVE

## 2017-02-18 MED ORDER — ACETAMINOPHEN 500 MG PO TABS: 1000.0000 mg | ORAL_TABLET | Freq: Four times a day (QID) | ORAL | Status: DC | PRN

## 2017-02-18 MED ORDER — ERGOCALCIFEROL 1.25 MG (50000 UT) PO CAPS: 50000.0000 [IU] | ORAL_CAPSULE | ORAL | 0 refills | Status: AC

## 2017-02-18 MED ORDER — VITAMIN B-12 1000 MCG PO TABS: 1000.0000 ug | ORAL_TABLET | Freq: Every day | ORAL | 0 refills | Status: DC

## 2017-02-18 MED ORDER — IPRATROPIUM-ALBUTEROL 0.5-2.5 (3) MG/3ML IN SOLN
3.0000 mL | Freq: Four times a day (QID) | RESPIRATORY_TRACT | Status: DC
  Administered 2017-02-18: 3 mL via RESPIRATORY_TRACT
  Filled 2017-02-18 (×2): qty 3

## 2017-02-18 MED ORDER — PREDNISONE 50 MG PO TABS
50.0000 mg | ORAL_TABLET | Freq: Every day | ORAL | 0 refills | Status: DC
Start: 2017-02-19 — End: 2017-02-18

## 2017-02-18 MED ORDER — PHENYTOIN SODIUM EXTENDED 100 MG PO CAPS: 100.0000 mg | ORAL_CAPSULE | ORAL | Status: DC

## 2017-02-18 MED ORDER — DOXYCYCLINE HYCLATE 100 MG PO TABS: 100.0000 mg | ORAL_TABLET | Freq: Two times a day (BID) | ORAL | 0 refills | Status: AC

## 2017-02-18 MED ORDER — PHENYTOIN SODIUM EXTENDED 100 MG PO CAPS: 200.0000 mg | ORAL_CAPSULE | Freq: Every day | ORAL | Status: DC

## 2017-02-18 MED ORDER — PANTOPRAZOLE SODIUM 40 MG PO TBEC: 40.0000 mg | DELAYED_RELEASE_TABLET | Freq: Every day | ORAL | 0 refills | Status: DC

## 2017-02-18 MED ORDER — DOXYCYCLINE HYCLATE 100 MG PO TABS
100.0000 mg | ORAL_TABLET | Freq: Two times a day (BID) | ORAL | Status: DC
  Administered 2017-02-18: 100 mg via ORAL
  Filled 2017-02-18: qty 1

## 2017-02-18 MED ORDER — DOXYCYCLINE HYCLATE 100 MG PO TABS: 100.0000 mg | ORAL_TABLET | Freq: Two times a day (BID) | ORAL | 0 refills | Status: DC

## 2017-02-18 MED ORDER — PREDNISONE 50 MG PO TABS: 50.0000 mg | ORAL_TABLET | Freq: Every day | ORAL | 0 refills | Status: AC

## 2017-02-18 NOTE — Consult Note (Signed)
Cardiology Consultation:   Patient ID: [name]; [id number]; [birth year]   Admit date: 02/17/2017 Date of Consult: 02/18/2017  Primary Care Provider: [name], MD Primary Cardiologist: New to Dr. Debara Pickett  Chief Complaint: shortness of breath, CP  Patient Profile:   [name] is a 61 y.o. 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 Marily Memos (CP unit consult).  History of Present Illness:   [name] has no prior cardiac history but does report a) 20-year history of tobacco abuse (previously 2ppd, down to 4 cigarettes/day) and b) family history of CAD with her mother having 4 MIs, first believed to be in her 27s, and father dying suddenly at 75 years old with autopsy showing heart attack. Her major issues over the years have been related to her recurrent seizure disorder.  The night before last around 3am she awoke with worsening SOB - could not catch her breath. Associated with this chest pain located centrally as well as to the right side and under her right breast going towards her back. This has been persistent/constant without resolution since that time. She also has noticed leg numbness. She also has had a 6-9lb unintentional weight loss over the course of a month. EMS was called. Sats 89% on room air on their arrival. EMS gave 324 ASA, 1 albuterol, 1 atrovent en route. She has never had any similar chest pain. This chest pain is made worse by taking a deep breath in and by palpation of the chest wall. No change with meals or exerting herself. Troponins neg x4, Hgb 13.7->11.6 (MCV 103), Cr 0.63, d-dimer negative. CXR c/w COPD, no edema or consolidation. She was admitted for COPD exacerbation  and is being treated with steroids, fluids, nebs, and pain medication.  Past Medical History:  Diagnosis Date  . Asthma   . Cerebral hemorrhage ([place]) 1989  . Chronic back pain   . COPD (chronic obstructive pulmonary disease) ([place])   . DDD (degenerative disc disease) 06/12/2013  . DVT (deep venous thrombosis) ([place])    "she's had several since 1990"  . GERD (gastroesophageal reflux disease)   . Headache    "usually around time when she's had a seizure"  . History of blood transfusion    "when she was a baby"  . History of stomach ulcers   . Kidney stones   . Seizures ([place])    "because of her brain surgery"  . Stroke [facility]) [phone]   family denies residual on 11/09/2014  . Tunnel vision    "since brain OR"    Past Surgical History:  Procedure Laterality Date  . [place]   craniotomy with hematoma evacuation  . CESAREAN SECTION  1979; 1987; 1989  . HEMORRHOID SURGERY    . TUBAL LIGATION  1990's     Inpatient Medications: Scheduled Meds: . enoxaparin (LOVENOX) injection  40 mg Subcutaneous Q24H  . lacosamide  200 mg Oral BID  . nicotine  7 mg Transdermal Daily  . pantoprazole  40 mg Oral BID  . phenytoin  160 mg Oral Daily  . phenytoin  200 mg Oral QHS  . predniSONE  50 mg Oral Q breakfast  . sucralfate  1 g Oral TID WC & HS  Continuous Infusions: . sodium chloride 75 mL/hr at 02/17/17 2240   PRN Meds: acetaminophen, benzonatate, diclofenac sodium, gi cocktail, guaiFENesin, ibuprofen, ondansetron **OR** ondansetron (ZOFRAN) IV  Allergies:    Allergies  Allergen Reactions  . Zonisamide Other (See Comments)    Numbness and tingling over whole body  . Codeine Nausea And Vomiting  . Keppra [Levetiracetam] Other (See Comments)    Causes seizures.   . Pregabalin Nausea And Vomiting  . Olive Oil Rash    Social History:   Social History   Social History  . Marital status: Divorced    Spouse name: N/A  . Number of children: N/A  . Years of education:  N/A   Occupational History  . Not on file.   Social History Main Topics  . Smoking status: Current Every Day Smoker    Packs/day: 0.50    Years: 20.00    Types: Cigarettes  . Smokeless tobacco: Never Used     Comment: Previously smoked 2 ppd, down to 4 cigs/day  . Alcohol use No  . Drug use: No  . Sexual activity: No   Other Topics Concern  . Not on file   Social History Narrative  . No narrative on file    Family History:   The patient's family history includes Cancer in her brother and mother; Heart attack in her father and mother.  ROS:  Please see the history of present illness.  All other ROS reviewed and negative.     Physical Exam/Data:   Vitals:   02/17/17 1630 02/17/17 1810 02/17/17 2048 02/18/17 0437  BP: 102/62 (!) 107/50 (!) 100/52 94/78  Pulse: 65 67 68 60  Resp:  18 (!) 22 (!) 21  Temp:  99 F (37.2 C) 97.9 F (36.6 C) 97.8 F (36.6 C)  TempSrc:  Oral Oral Oral  SpO2: 100% 99% 98% 96%  Weight:    121 lb 1.6 oz (54.9 kg)  Height:        Intake/Output Summary (Last 24 hours) at 02/18/17 4098 Last data filed at 02/18/17 1191  Gross per 24 hour  Intake           1657.5 ml  Output             1100 ml  Net            557.5 ml   Filed Weights   02/17/17 0501 02/18/17 0437  Weight: 128 lb (58.1 kg) 121 lb 1.6 oz (54.9 kg)   Body mass index is 18.97 kg/m.  General: Cachectic appearing WF in no acute distress. Head: Normocephalic, atraumatic, sclera non-icteric, no xanthomas, nares are without discharge.  Neck: Negative for carotid bruits. JVD not elevated. Lungs: Barrel chested. Diffusely diminished without wheezes, rales, or rhonchi. Breathing is unlabored. Heart: RRR with S1 S2. No murmurs, rubs, or gallops appreciated. Abdomen: Soft, non-tender, non-distended with normoactive bowel sounds. No hepatomegaly. No rebound/guarding. No obvious abdominal masses. Msk:  Strength and tone appear normal for age. Extremities: No clubbing or cyanosis. No  edema.  Distal pedal pulses are 2+ and equal bilaterally. Neuro: Alert and oriented X 3. No facial asymmetry. No focal deficit. Moves all extremities spontaneously. Psych:  Responds to questions appropriately with a normal affect.  EKG:  The EKG was personally reviewed and demonstrates NSR with possible rightward axis but otherwise no diagnostic acute ischemic changes  Relevant CV Studies: N/a  Laboratory Data:  Chemistry  Recent Labs Lab 02/17/17 0527 02/18/17 0315  NA 142 141  K 3.8 3.5  CL 109 111  CO2 27 26  GLUCOSE 91 87  BUN 14 8  CREATININE 0.76 0.63  CALCIUM 8.7* 8.1*  GFRNONAA >60 >60  GFRAA >60 >60  ANIONGAP 6 4*     Recent Labs Lab 02/17/17 0527  PROT 6.1*  ALBUMIN 3.8  AST 19  ALT 13*  ALKPHOS 58  BILITOT 0.3   Hematology  Recent Labs Lab 02/17/17 0527 02/18/17 0315  WBC 4.8 4.2  RBC 4.07 3.40*  HGB 13.7 11.6*  HCT 41.8 35.1*  MCV 102.7* 103.2*  MCH 33.7 34.1*  MCHC 32.8 33.0  RDW 12.7 13.2  PLT 149* 122*   Cardiac Enzymes  Recent Labs Lab 02/17/17 0930 02/17/17 1211 02/17/17 1457  TROPONINI <0.03 <0.03 <0.03     Recent Labs Lab 02/17/17 0525  TROPIPOC 0.02    BNPNo results for input(s): BNP, PROBNP in the last 168 hours.  DDimer   Recent Labs Lab 02/17/17 0527  DDIMER 0.27    Radiology/Studies:  Dg Chest Portable 1 View  Result Date: 02/17/2017 CLINICAL DATA:  Mid chest pain, tachycardia. History of COPD, chronic bronchitis, asthma. EXAM: PORTABLE CHEST 1 VIEW COMPARISON:  Chest radiograph November 01, 2015 FINDINGS: Cardiomediastinal silhouette is normal. Mildly calcified aortic arch. Increased lung volumes and similar mild chronic interstitial changes without pleural effusion or focal consolidation. No pneumothorax. Old RIGHT clavicle fracture. Old RIGHT rib fractures. Soft tissue planes are nonsuspicious. IMPRESSION: COPD. Electronically Signed   By: Elon Alas M.D.   On: 02/17/2017 06:02    Assessment and Plan:    1. Chest pain/SOB - dyspnea sounds more c/w COPD in the setting of ongoing tobacco abuse. Chest pain is atypical, worse with inspiration and palpation. EKG unrevealing and troponins neg x 4 despite constant, uninterrupted pain since 7/17 3am thus unlikely to be cardiac. Given family history and tobacco abuse, risk stratification may be helpful at some point, consider as OP. Check lipids in AM. Will review with Dr. Debara Pickett.  2. Tobacco abuse - counseled regarding cessation. She is working hard to continue to cut down.  3. Prior history of DVT - this happened in the 1980s around hospitalization for brain bleed. No known recurrence since that time and d-dimer is normal this admission.  4. Unintentional weight loss - she reports at one point she had gotten down to 112 but had worked hard to get this back up to 130, but over the last month is back down to 121 for unclear reasons. Will defer commentary to medical team.  Signed, Charlie Pitter, PA-C  02/18/2017 8:22 AM

## 2017-02-18 NOTE — Progress Notes (Signed)
Preliminary results by tech - Venous Duplex Lower Ext. Completed. No evidence of acute deep vein thrombosis in both legs. 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 Oda Cogan, BS, RDMS, RVT

## 2017-02-18 NOTE — Progress Notes (Signed)
Patient states she feels somewhat better, less shaky.  O2 [place] removed, continues to sat 95% on room air

## 2017-02-18 NOTE — Discharge Summary (Addendum)
Physician Discharge Summary  [name] WER:[id number] DOB: 25-Nov-1955 DOA: 02/17/2017  PCP: [name], MD  Admit date: 02/17/2017 Discharge date: 02/18/2017  Admitted From: HOME  Disposition: HOME  Recommendations for Outpatient Follow-up:  1. Follow up with PCP in 1 weeks 2. Follow up with cardiologist in 2 - 3 weeks 3. Follow up with neurologist in 1 week 4. Repeat dilantin level in 1 week.  5. Please obtain BMP/CBC in one week  Discharge Condition: STABLE   CODE STATUS: FULL    Brief Hospitalization Summary: Please see all hospital notes, images, labs for full details of the hospitalization. HPI: [name] is a 61 y.o. female with medical history significant of asthma, intracranial hemorrhage status post craniotomy with chronic headaches and seizures, nephrolithiasis, DVT, COPD, chronic back pain.  Patient describes waking up suddenly at approximately 03:00 with chest tightness and a sensation of needing to gasp for air.. Located throughout her chest without specific focal location. States that it felt like a "bolt "going through her chest. Some radiation to the back. Associated with shortness of breath. Pain is worse with deep inspiration and palpation of chest wall. No worsening with exertion. Patient continues to smoke 3-4 cigarettes per day. Endorses occasional cough that is nonproductive. EMS was called emergently to the patient's house she was noted to be wheezing with an O2 saturation of 89% on room air. Patient was given a breathing treatment without improvement in her subjective symptoms. Patient has never had any similar chest pain. Currently patient states that her chest pain persists but her respiratory symptoms are markedly improved. Denies any recent prolonged travel, recent illnesses that would necessitate her to be bedbound, or estrogen replacement therapy. Patient endorses having approximately 1-2 seizures per month at baseline. Patient states that over the last  3-4 days she has had a sensation that her legs are swollen and has had some numbness and tingling in her legs bilaterally. Left is worse than right.  ED Course: Objective findings outlined below. Given duo nebulizer treatments 2, 125 mg aside Medrol, 1 L normal saline bolus, and fentanyl with improvement in overall symptoms.    CP: not thought to be cardiac in origin, seen by cardiology and felt ok for outpatient workup.  Pt to follow up with Dr. Debara Pickett on discharge.   HEART score 4. Suspect this is due to pleuritic pain from mild COPD exacerbation in conjunction with costochondritis. Patient's costochondral border is exquisitely tender to palpation. EKG without signs of ACS. Troponins normal. Nonexertional. Doubt pulmonary embolism due to normal d-dimer.   COPD exacerbation: Documented hypoxemia with O2 saturation of 89% and respiratory distress at time of EMS arrival patient's home. Improved after breathing treatments and Solu-Medrol. - DuoNeb every 424 hours - Prednisone 50 mg daily x 7 days then STOP - doxycycline x 7 days. Encouraged smoking cessation strongly.  Lower extremity tightness/numbness: Trace to 1+ left lower extremity edema with trace right lower extremity pitting edema. Patient states is been ongoing for 3-4 days but does have a history of vitamin deficiency causing neuropathy. D-dimer negative. Does have a history of DVT. - Lower extremity duplex neg for acute findings.  - B12, folate, vitamin D levels are low and would supplement, follow up with PCP.  - Dilantin level elevated at 27, reduced dose of dilantin and provided supportive care, follow up with neurologist and PCP, recheck dilantin level in 1 week outpatient.   - start neurontin if not improving or simply have pt f/u w/ Neurology  as outpt.   Seizures: Patient has approximately 1-2 per month ever since sustaining intracranial hemorrhage in the 1990s. Patient states this is at baseline. - continue Dilantin and Vimpat  (reduced dose of dilantin due to elevated dilantin level)  GERD: not taking PPI. CP may be related to reflux esophagitis - carafate x24hrs - PPI  DVT prophylaxis: Lovenox  Code Status: full  Family Communication: none  Disposition Plan: pending CP r/o   Consults called: none  Admission status: observation  Discharge Diagnoses:  Active Problems:   Seizure disorder (HCC)   History of Intracranial bleed   Tobacco dependence   Paresthesias   COPD exacerbation (HCC)   Chest pain   Atypical chest pain   Localized swelling of lower extremity   GERD (gastroesophageal reflux disease)   Costochondritis  Discharge Instructions: Discharge Instructions    Ambulatory referral to Cardiology    Complete by:  As directed    Call MD for:  difficulty breathing, headache or visual disturbances    Complete by:  As directed    Call MD for:  extreme fatigue    Complete by:  As directed    Call MD for:  persistant dizziness or light-headedness    Complete by:  As directed    Call MD for:  persistant nausea and vomiting    Complete by:  As directed    Call MD for:  severe uncontrolled pain    Complete by:  As directed    Call MD for:  temperature >100.4    Complete by:  As directed    Diet - low sodium heart healthy    Complete by:  As directed    Increase activity slowly    Complete by:  As directed      Allergies as of 02/18/2017      Reactions   Zonisamide Other (See Comments)   Numbness and tingling over whole body   Codeine Nausea And Vomiting   Keppra [levetiracetam] Other (See Comments)   Causes seizures.    Pregabalin Nausea And Vomiting   Olive Oil Rash      Medication List    STOP taking these medications   feeding supplement (ENSURE ENLIVE) Liqd   ibuprofen 400 MG tablet Commonly known as:  ADVIL,MOTRIN   oxyCODONE 5 MG immediate release tablet Commonly known as:  ROXICODONE     TAKE these medications   doxycycline 100 MG tablet Commonly known as:   VIBRA-TABS Take 1 tablet (100 mg total) by mouth every 12 (twelve) hours.   ergocalciferol 50000 units capsule Commonly known as:  DRISDOL Take 1 capsule (50,000 Units total) by mouth once a week.   lacosamide 200 MG Tabs tablet Commonly known as:  VIMPAT Take 200 mg by mouth 2 (two) times daily.   pantoprazole 40 MG tablet Commonly known as:  PROTONIX Take 1 tablet (40 mg total) by mouth daily. What changed:  when to take this   phenytoin 100 MG ER capsule Commonly known as:  DILANTIN Take 2 capsules (200 mg total) by mouth at bedtime. What changed:  how much to take  how to take this  when to take this  additional instructions  Another medication with the same name was removed. Continue taking this medication, and follow the directions you see here.   predniSONE 50 MG tablet Commonly known as:  DELTASONE Take 1 tablet (50 mg total) by mouth daily with breakfast.   vitamin B-12 1000 MCG tablet Commonly known as:  CYANOCOBALAMIN  Take 1 tablet (1,000 mcg total) by mouth daily.      Follow-up Information    [name], MD. Schedule an appointment as soon as possible for a visit in 1 week(s).   Specialty:  Internal Medicine Contact information: [place] [address] [phone]        Pixie Casino, MD. Schedule an appointment as soon as possible for a visit in 2 week(s).   Specialty:  Cardiology Why:  Hospital Follow Up  Contact information: [address] [place] [address] [phone]          Allergies  Allergen Reactions  . Zonisamide Other (See Comments)    Numbness and tingling over whole body  . Codeine Nausea And Vomiting  . Keppra [Levetiracetam] Other (See Comments)    Causes seizures.   . Pregabalin Nausea And Vomiting  . Olive Oil Rash   Current Discharge Medication List    START taking these medications   Details  doxycycline (VIBRA-TABS) 100 MG tablet Take 1 tablet (100 mg  total) by mouth every 12 (twelve) hours. Qty: 14 tablet, Refills: 0    ergocalciferol (DRISDOL) 50000 units capsule Take 1 capsule (50,000 Units total) by mouth once a week. Qty: 4 capsule, Refills: 0    predniSONE (DELTASONE) 50 MG tablet Take 1 tablet (50 mg total) by mouth daily with breakfast. Qty: 7 tablet, Refills: 0    vitamin B-12 (CYANOCOBALAMIN) 1000 MCG tablet Take 1 tablet (1,000 mcg total) by mouth daily. Qty: 30 tablet, Refills: 0      CONTINUE these medications which have CHANGED   Details  pantoprazole (PROTONIX) 40 MG tablet Take 1 tablet (40 mg total) by mouth daily. Qty: 30 tablet, Refills: 0    phenytoin (DILANTIN) 100 MG ER capsule Take 2 capsules (200 mg total) by mouth at bedtime.      CONTINUE these medications which have NOT CHANGED   Details  lacosamide (VIMPAT) 200 MG TABS tablet Take 200 mg by mouth 2 (two) times daily.      STOP taking these medications     feeding supplement, ENSURE ENLIVE, (ENSURE ENLIVE) LIQD      ibuprofen (ADVIL,MOTRIN) 400 MG tablet      oxyCODONE (ROXICODONE) 5 MG immediate release tablet         Procedures/Studies: Dg Chest Portable 1 View  Result Date: 02/17/2017 CLINICAL DATA:  Mid chest pain, tachycardia. History of COPD, chronic bronchitis, asthma. EXAM: PORTABLE CHEST 1 VIEW COMPARISON:  Chest radiograph November 01, 2015 FINDINGS: Cardiomediastinal silhouette is normal. Mildly calcified aortic arch. Increased lung volumes and similar mild chronic interstitial changes without pleural effusion or focal consolidation. No pneumothorax. Old RIGHT clavicle fracture. Old RIGHT rib fractures. Soft tissue planes are nonsuspicious. IMPRESSION: COPD. Electronically Signed   By: Elon Alas M.D.   On: 02/17/2017 06:02     Subjective: Pt says she is feeling a lot better, breathing better and no chest pain.    Discharge Exam: Vitals:   02/17/17 2048 02/18/17 0437  BP: (!) 100/52 94/78  Pulse: 68 60  Resp: (!) 22 (!)  21  Temp: 97.9 F (36.6 C) 97.8 F (36.6 C)   Vitals:   02/17/17 1630 02/17/17 1810 02/17/17 2048 02/18/17 0437  BP: 102/62 (!) 107/50 (!) 100/52 94/78  Pulse: 65 67 68 60  Resp:  18 (!) 22 (!) 21  Temp:  99 F (37.2 C) 97.9 F (36.6 C) 97.8 F (36.6 C)  TempSrc:  Oral Oral Oral  SpO2: 100% 99% 98% 96%  Weight:    54.9 kg (121 lb 1.6 oz)  Height:       General: Pt is alert, awake, not in acute distress Cardiovascular: RRR, S1/S2 +, no rubs, no gallops Respiratory: CTA bilaterally, no wheezing, no rhonchi Abdominal: Soft, NT, ND, bowel sounds + Extremities: no edema, no cyanosis   The results of significant diagnostics from this hospitalization (including imaging, microbiology, ancillary and laboratory) are listed below for reference.     Microbiology: No results found for this or any previous visit (from the past 240 hour(s)).   Labs: BNP (last 3 results) No results for input(s): BNP in the last 8760 hours. Basic Metabolic Panel:  Recent Labs Lab 02/17/17 0527 02/18/17 0315  NA 142 141  K 3.8 3.5  CL 109 111  CO2 27 26  GLUCOSE 91 87  BUN 14 8  CREATININE 0.76 0.63  CALCIUM 8.7* 8.1*   Liver Function Tests:  Recent Labs Lab 02/17/17 0527  AST 19  ALT 13*  ALKPHOS 58  BILITOT 0.3  PROT 6.1*  ALBUMIN 3.8    Recent Labs Lab 02/17/17 0527  LIPASE 31   No results for input(s): AMMONIA in the last 168 hours. CBC:  Recent Labs Lab 02/17/17 0527 02/18/17 0315  WBC 4.8 4.2  NEUTROABS 1.5*  --   HGB 13.7 11.6*  HCT 41.8 35.1*  MCV 102.7* 103.2*  PLT 149* 122*   Cardiac Enzymes:  Recent Labs Lab 02/17/17 0930 02/17/17 1211 02/17/17 1457  TROPONINI <0.03 <0.03 <0.03   BNP: Invalid input(s): POCBNP CBG: No results for input(s): GLUCAP in the last 168 hours. D-Dimer  Recent Labs  02/17/17 0527  DDIMER 0.27   Hgb A1c No results for input(s): HGBA1C in the last 72 hours. Lipid Profile No results for input(s): CHOL, HDL, LDLCALC,  TRIG, CHOLHDL, LDLDIRECT in the last 72 hours. Thyroid function studies No results for input(s): TSH, T4TOTAL, T3FREE, THYROIDAB in the last 72 hours.  Invalid input(s): FREET3 Anemia work up  Recent Labs  02/17/17 0930  VITAMINB12 174*   Urinalysis    Component Value Date/Time   COLORURINE YELLOW 02/17/2017 [place] 02/17/2017 2304   LABSPEC 1.005 02/17/2017 2304   PHURINE 7.0 02/17/2017 2304   GLUCOSEU NEGATIVE 02/17/2017 2304   HGBUR NEGATIVE 02/17/2017 2304   BILIRUBINUR NEGATIVE 02/17/2017 2304   KETONESUR NEGATIVE 02/17/2017 2304   PROTEINUR NEGATIVE 02/17/2017 2304   UROBILINOGEN 0.2 01/17/2015 2350   NITRITE NEGATIVE 02/17/2017 2304   LEUKOCYTESUR MODERATE (A) 02/17/2017 2304   Sepsis Labs Invalid input(s): PROCALCITONIN,  WBC,  LACTICIDVEN Microbiology No results found for this or any previous visit (from the past 240 hour(s)).  Time coordinating discharge: 34 minutes  SIGNED:  Irwin Brakeman, MD  [facility] 02/18/2017, 3:20 PM Pager [phone]  If 7PM-7AM, please contact night-coverage [email or web address] Password [email or web address]

## 2017-02-18 NOTE — Care Management Note (Signed)
Case Management Note  Patient Details  Name: [name] MRN: [id number] Date of Birth: Sep 25, 1955  Subjective/Objective: Pt presented for Chest Pain. Pt is from home with daughter and her husband. No needs identified by CM at the time of visit. Plan will be to return home at d/c.                   Action/Plan: CM will continue to monitor.   Expected Discharge Date:  02/19/17               Expected Discharge Plan:  Home/Self Care  In-House Referral:  NA  Discharge planning Services  CM Consult  Post Acute Care Choice:  NA Choice offered to:  NA  DME Arranged:  N/A DME Agency:  NA  HH Arranged:  NA HH Agency:  NA  Status of Service:  Completed, signed off  If discussed at [place] of Stay Meetings, dates discussed:    Additional Comments:  Bethena Roys, RN 02/18/2017, 11:39 AM

## 2017-02-18 NOTE — Discharge Instructions (Signed)
Follow with Primary MD  [name], James, MD  and other consultants as instructed your Hospitalist MD ° °Please get a complete blood count and chemistry panel checked by your Primary MD at your next visit, and again as instructed by your Primary MD. ° °Get Medicines reviewed and adjusted: °Please take all your medications with you for your next visit with your Primary MD ° °Laboratory/radiological data: °Please request your Primary MD to go over all hospital tests and procedure/radiological results at the follow up, please ask your Primary MD to get all Hospital records sent to his/her office. ° °In some cases, they will be blood work, cultures and biopsy results pending at the time of your discharge. Please request that your primary care M.D. follows up on these results. ° °Also Note the following: °If you experience worsening of your admission symptoms, develop shortness of breath, life threatening emergency, suicidal or homicidal thoughts you must seek medical attention immediately by calling 911 or calling your MD immediately  if symptoms less severe. ° °You must read complete instructions/literature along with all the possible adverse reactions/side effects for all the Medicines you take and that have been prescribed to you. Take any new Medicines after you have completely understood and accpet all the possible adverse reactions/side effects.  ° °Do not drive when taking Pain medications or sleeping medications (Benzodaizepines) ° °Do not take more than prescribed Pain, Sleep and Anxiety Medications. It is not advisable to combine anxiety,sleep and pain medications without talking with your primary care practitioner ° °Special Instructions: If you have smoked or chewed Tobacco  in the last 2 yrs please stop smoking, stop any regular Alcohol  and or any Recreational drug use. ° °Wear Seat belts while driving. ° °Please note: °You were cared for by a hospitalist during your hospital stay. Once you are discharged, your  primary care physician will handle any further medical issues. Please note that NO REFILLS for any discharge medications will be authorized once you are discharged, as it is imperative that you return to your primary care physician (or establish a relationship with a primary care physician if you do not have one) for your post hospital discharge needs so that they can reassess your need for medications and monitor your lab values. ° ° ° ° °

## 2017-02-18 NOTE — Progress Notes (Signed)
Pt ambulating with PT, flet weak, shaky, stated like she felt a seizure coming on.  Pt taken back to bed via w/c.  Pt desatted to low 80s. Given o2 via [place].  BP 98/59.   MD paged

## 2017-02-18 NOTE — Care Management Obs Status (Signed)
[facility] NOTIFICATION   Patient Details  Name: [name] MRN: [id number] Date of Birth: Oct 28, 1955   Medicare Observation Status Notification Given:  Yes    Bethena Roys, RN 02/18/2017, 11:33 AM

## 2017-02-19 IMAGING — DX DG CHEST 1V PORT
1 series · 1 of 1 positions shown · non-contrast
Comparison: 01/10/2015

CLINICAL DATA: Fall.  Seizure.  Right shoulder pain.

EXAM:
PORTABLE CHEST - 1 VIEW

[chest ap]
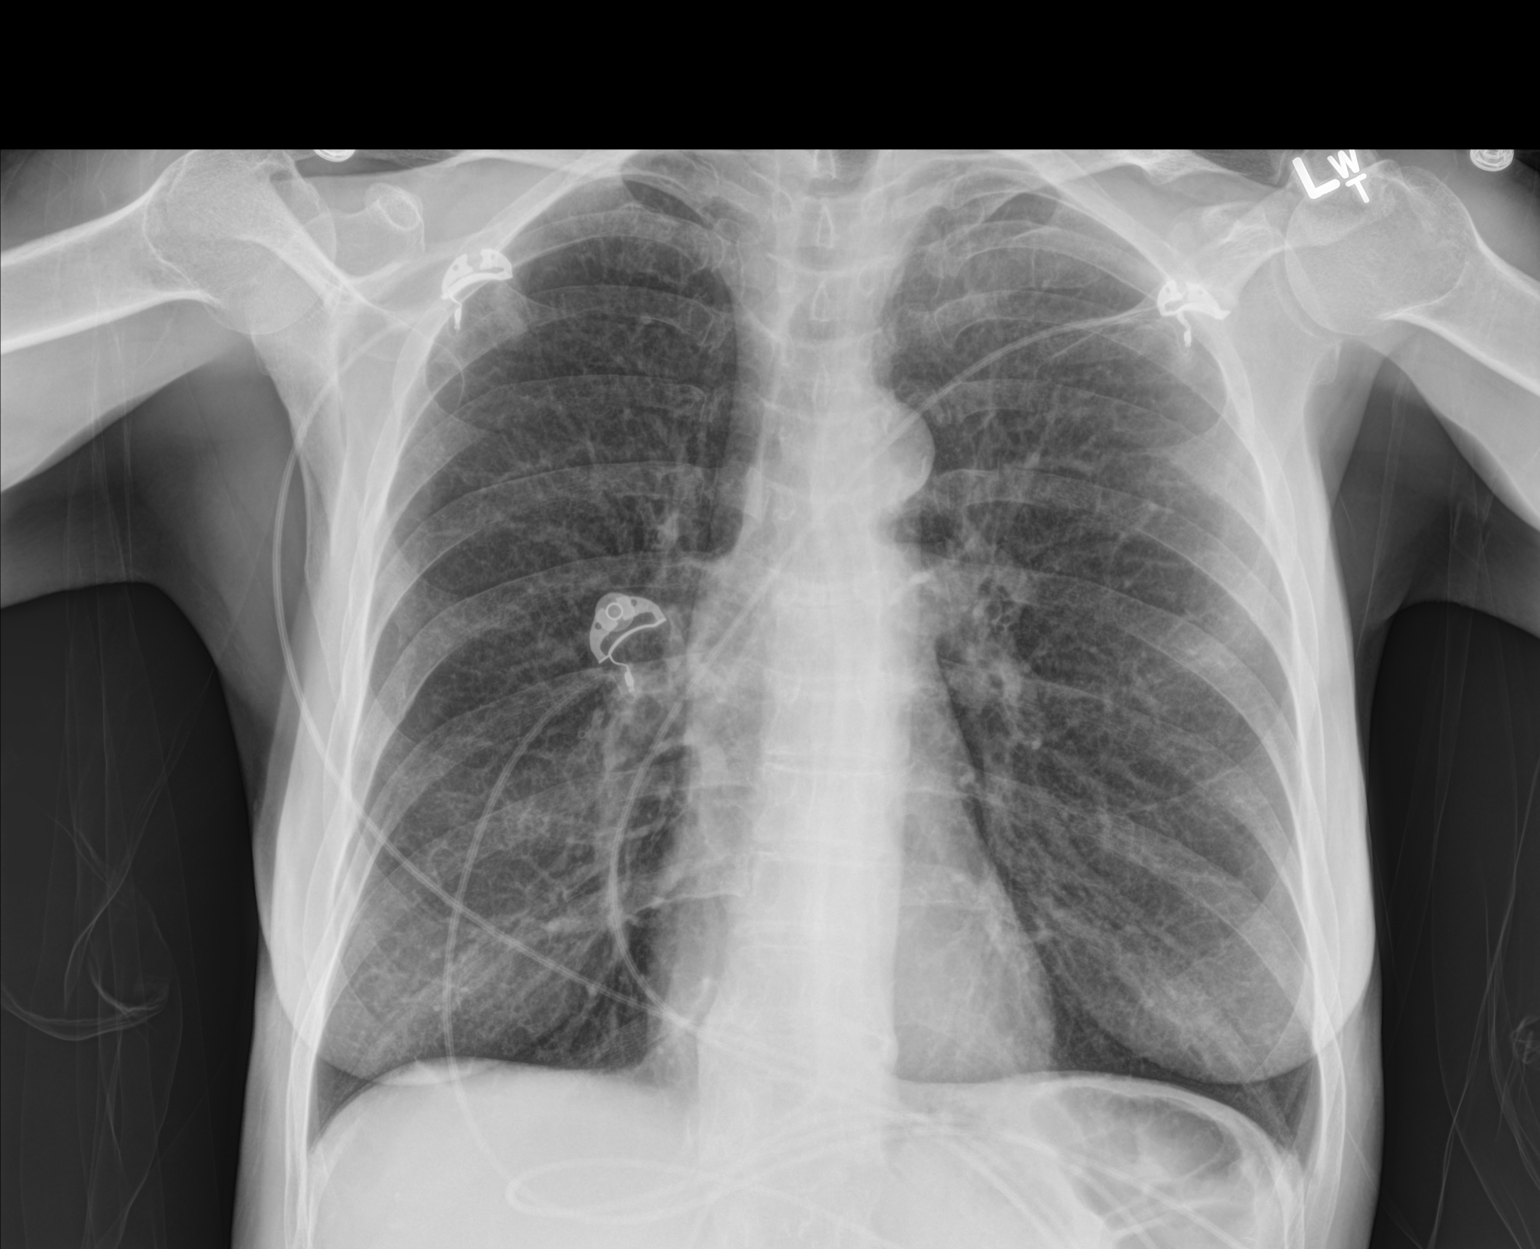

[1 of 1 positions shown; findings below may reference images not displayed]

FINDINGS: The heart size and mediastinal contours are within normal limits.
Both lungs are clear. The visualized skeletal structures are
unremarkable.
IMPRESSION: No active disease.

## 2017-02-19 IMAGING — DX DG SHOULDER 2+V PORT*R*
3 series · 3 of 3 positions shown · non-contrast
Comparison: None.

CLINICAL DATA: Fall.  Seizure.  Right shoulder pain.

EXAM:
PORTABLE RIGHT SHOULDER - 2+ VIEW

[shoulder ap]
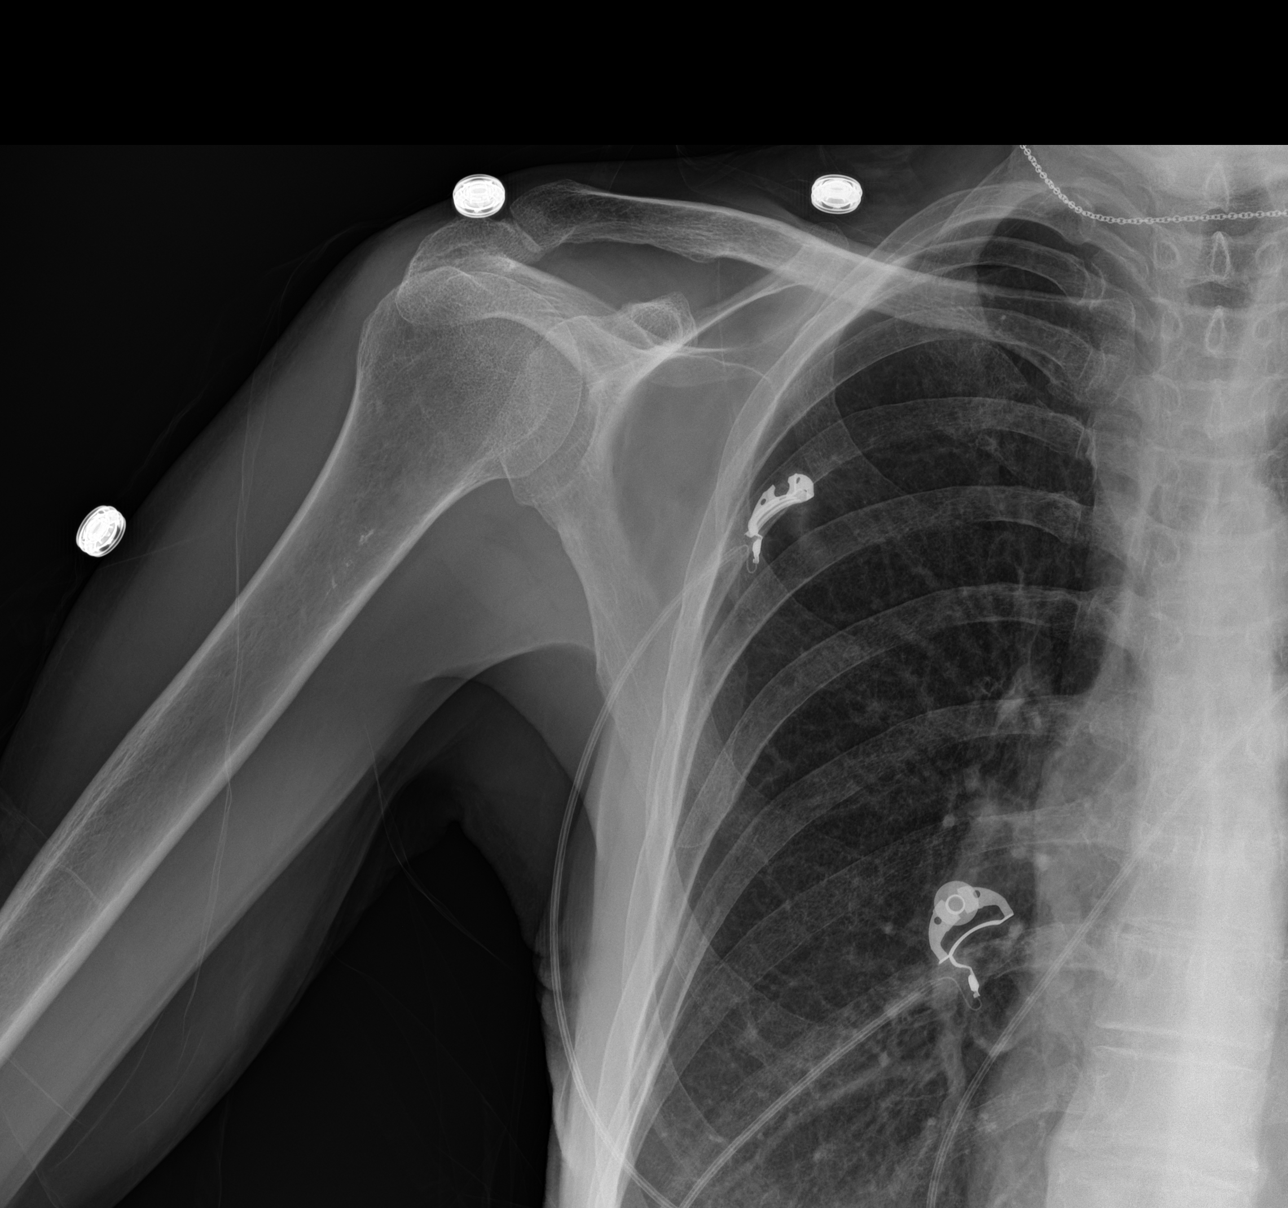

[shoulder obl]
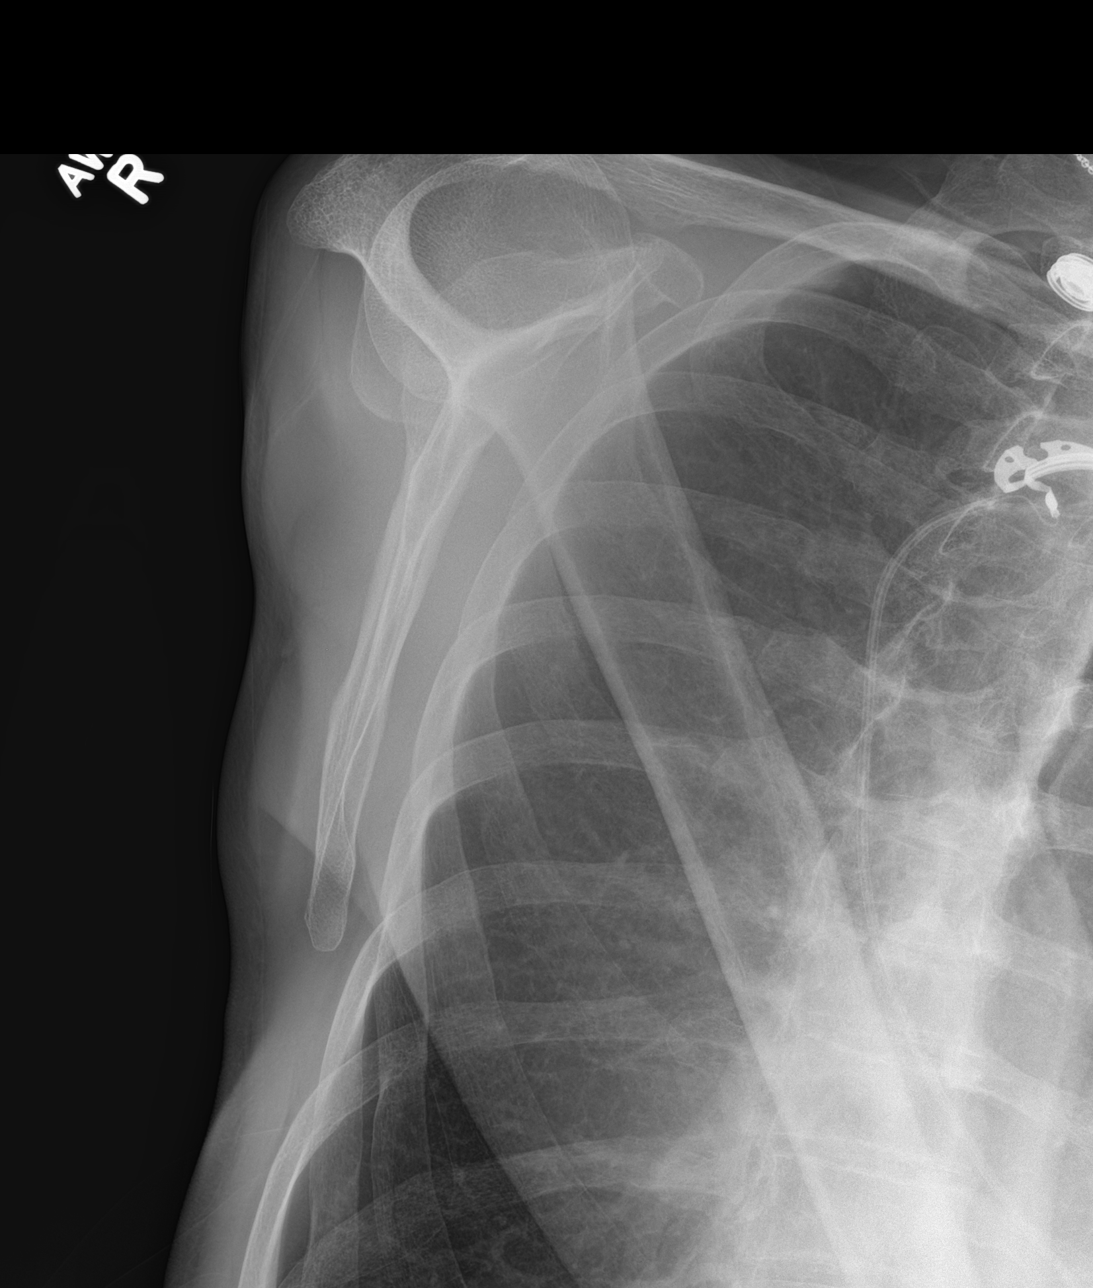

[shoulder axial]
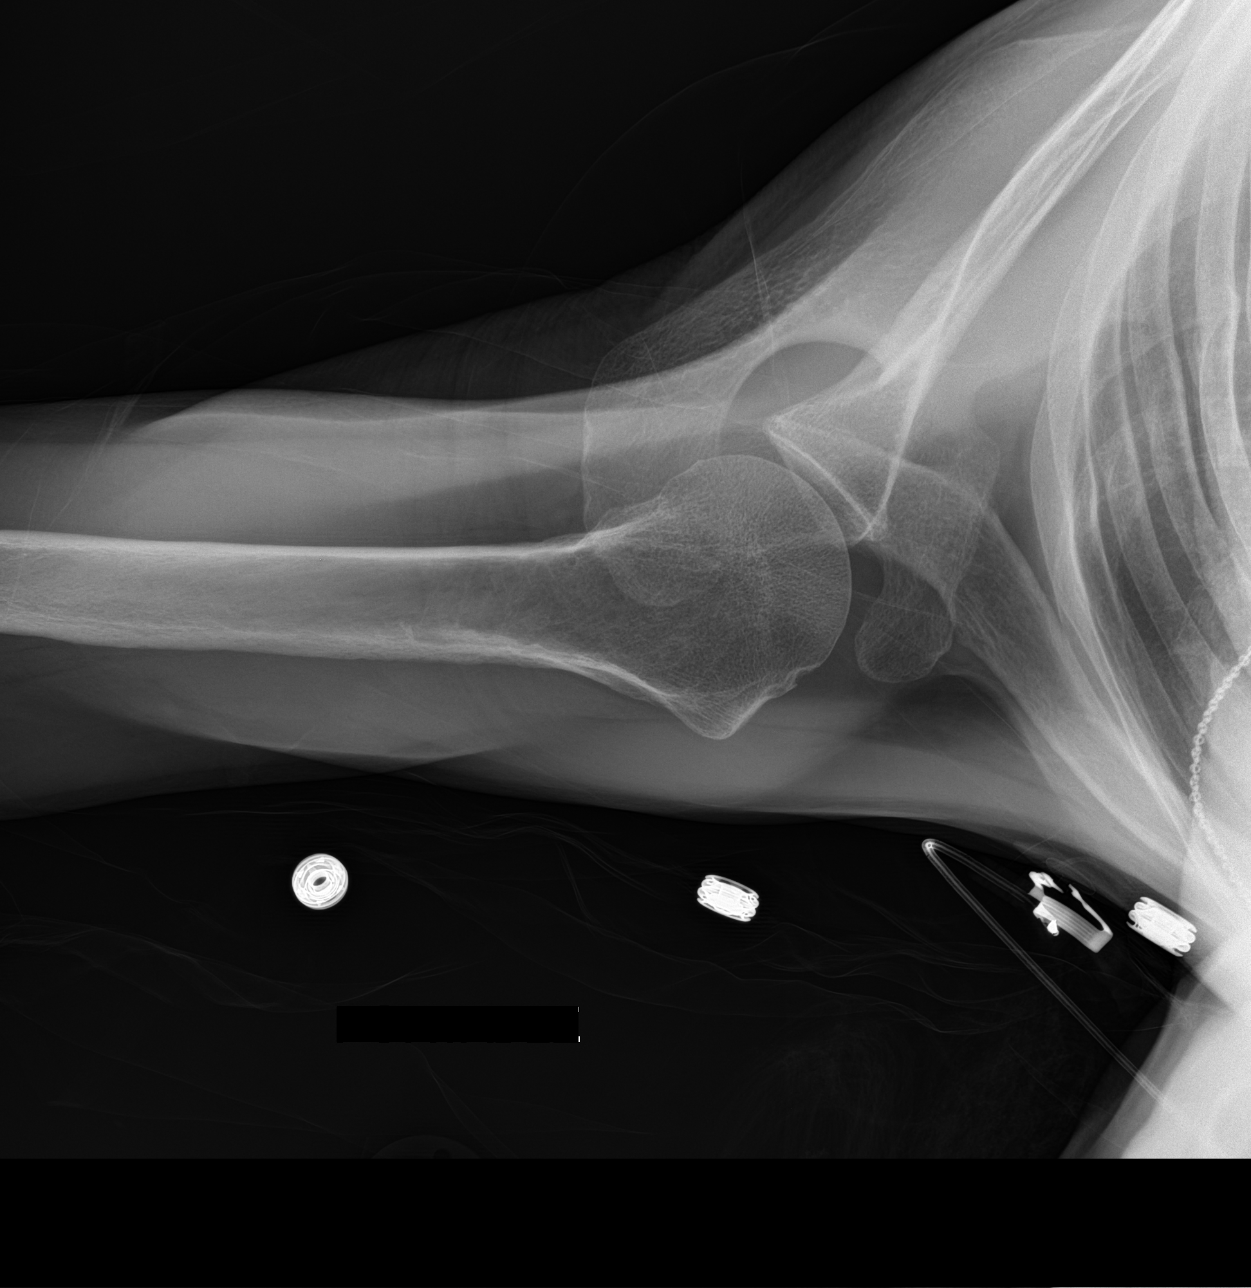

[3 of 3 positions shown; findings below may reference images not displayed]

FINDINGS: There is no evidence of fracture or dislocation. There is no
evidence of arthropathy or other focal bone abnormality. Soft
tissues are unremarkable.
IMPRESSION: Negative.

## 2017-02-20 NOTE — Consult Note (Signed)
           [facility] CM Primary Care Navigator  02/20/2017  [name] 1955/10/05 [id number]   Attempt to see patient at the bedsideto identify possible discharge needs but she was alreadydischarged.  Patient was discharged home per staff report .  Primary care provider's office called (Caren)to notify of patient's discharge and possible need for post hospital follow-up and transition of care. Reminded as well of patient's health issues (particularly COPD management) needing follow-up.   Made aware to refer patient to [facility] care management ifdeemed appropriateand necessary for further services.   For questions, please contact:  Dannielle Huh, BSN, RN- [facility] Primary Care Navigator  Telephone: [phone] [place]

## 2017-02-26 IMAGING — CR DG CHEST 2V
2 series · 2 of 2 positions shown · non-contrast
Comparison: 01/10/2015 and 02/27/2012 and chest CT dated 05/28/2007

CLINICAL DATA: Productive cough, wheezing, and shortness of breath.
Seizures today.

EXAM:
CHEST  2 VIEW

[w chest lat]
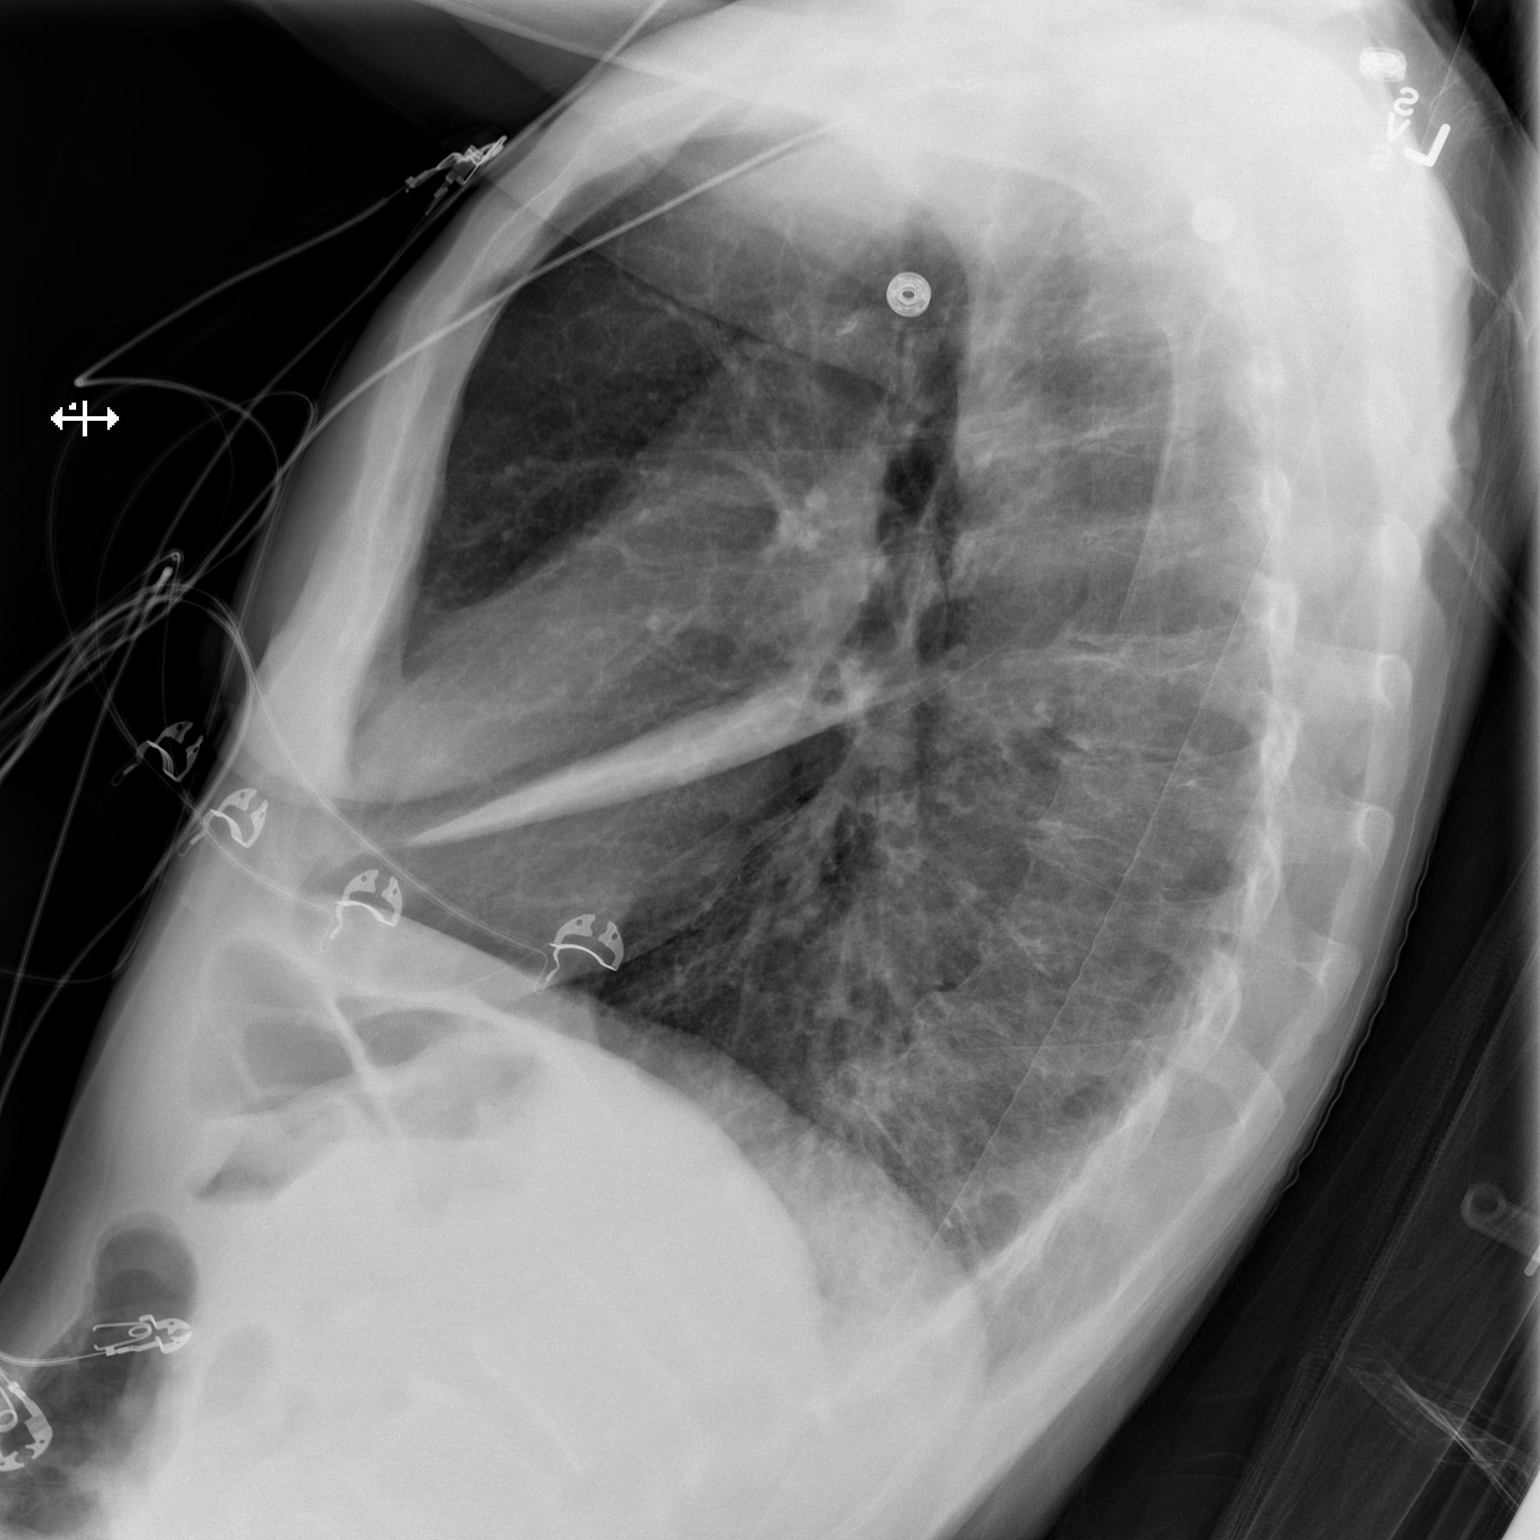

[x chest ap]
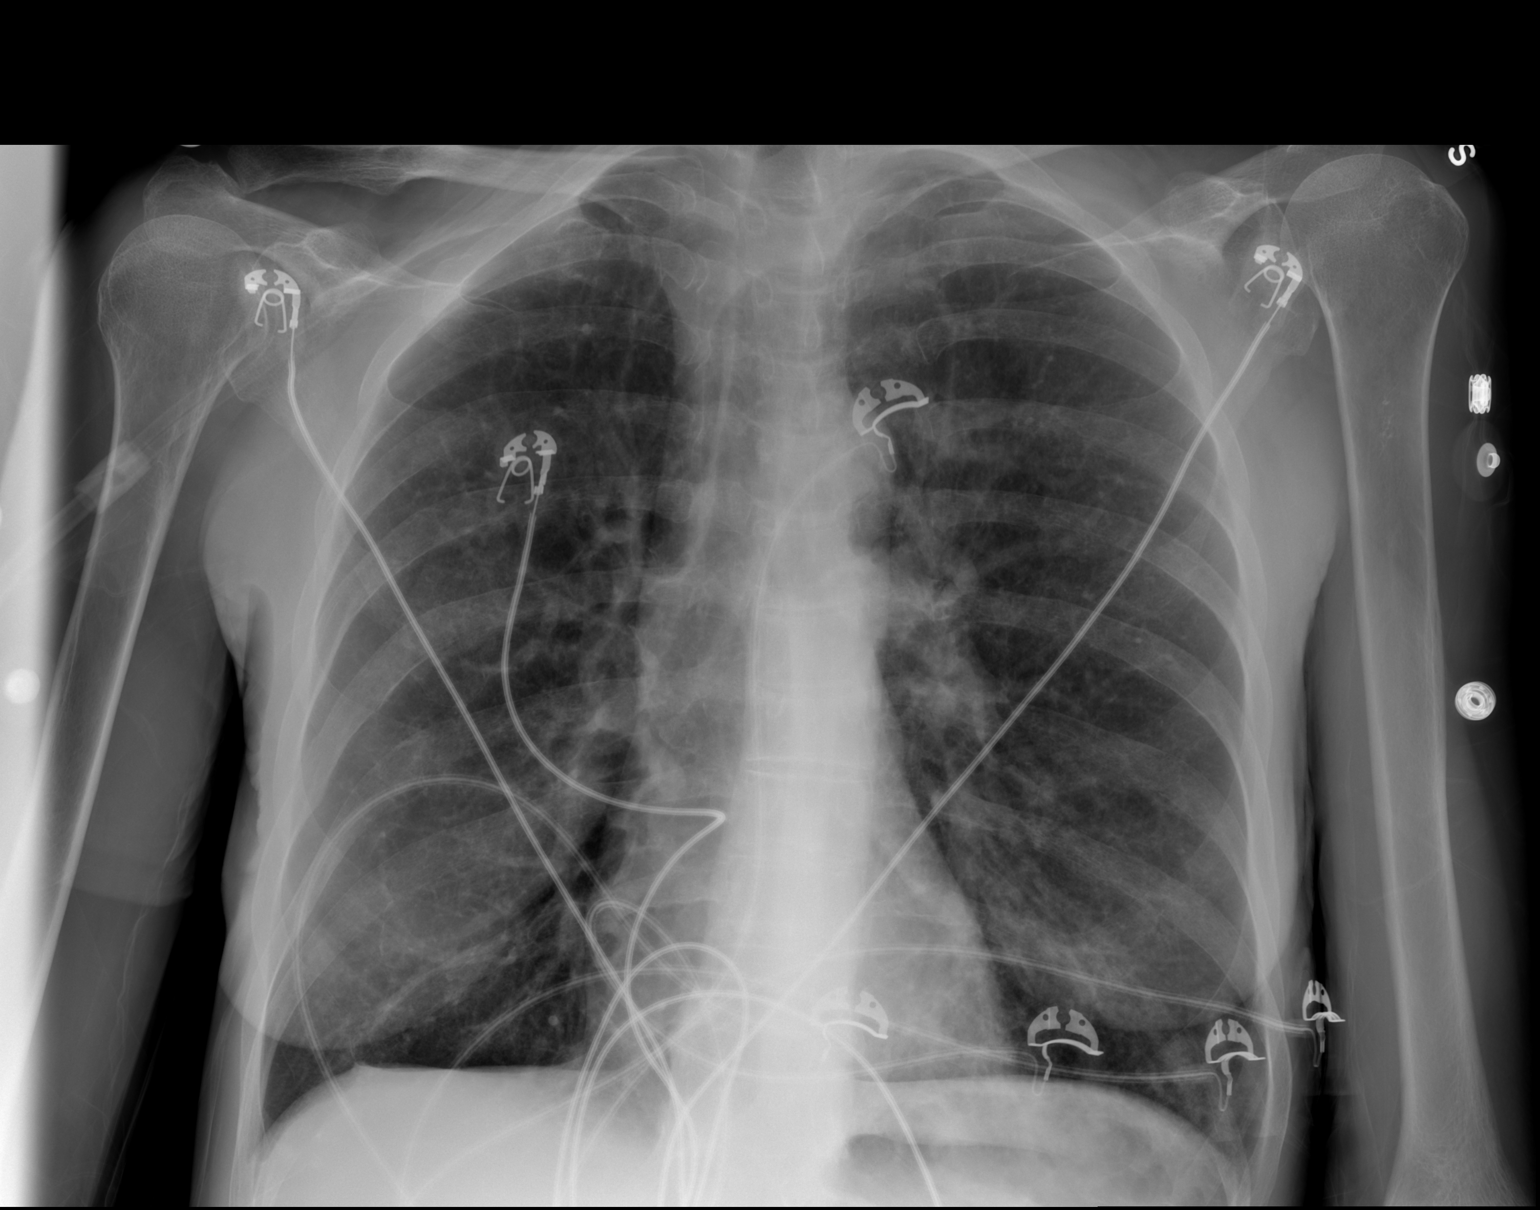

[2 of 2 positions shown; findings below may reference images not displayed]

FINDINGS: On the lateral view there appears to be complete atelectasis of the
right middle lobe which is demonstrated be relatively small on the
chest CT. Lungs are hyperinflated consistent with emphysema. Heart
size is normal. No effusions. No acute osseous abnormality.
IMPRESSION: There appears to be complete atelectasis of the right middle lobe. I
suspect the patient has mucous plugging of the right middle lobe
bronchus but follow-up is recommended to exclude persistent
atelectasis due to an obstructing mass. COPD.

## 2017-02-28 IMAGING — CR DG THORACIC SPINE 2V
1 series · 1 of 1 positions shown · non-contrast
Comparison: 01/18/2015

CLINICAL DATA: Right-sided back pain, no known injury, initial
encounter

EXAM:
THORACIC SPINE - 2 VIEW

[t t-spine a.p.]
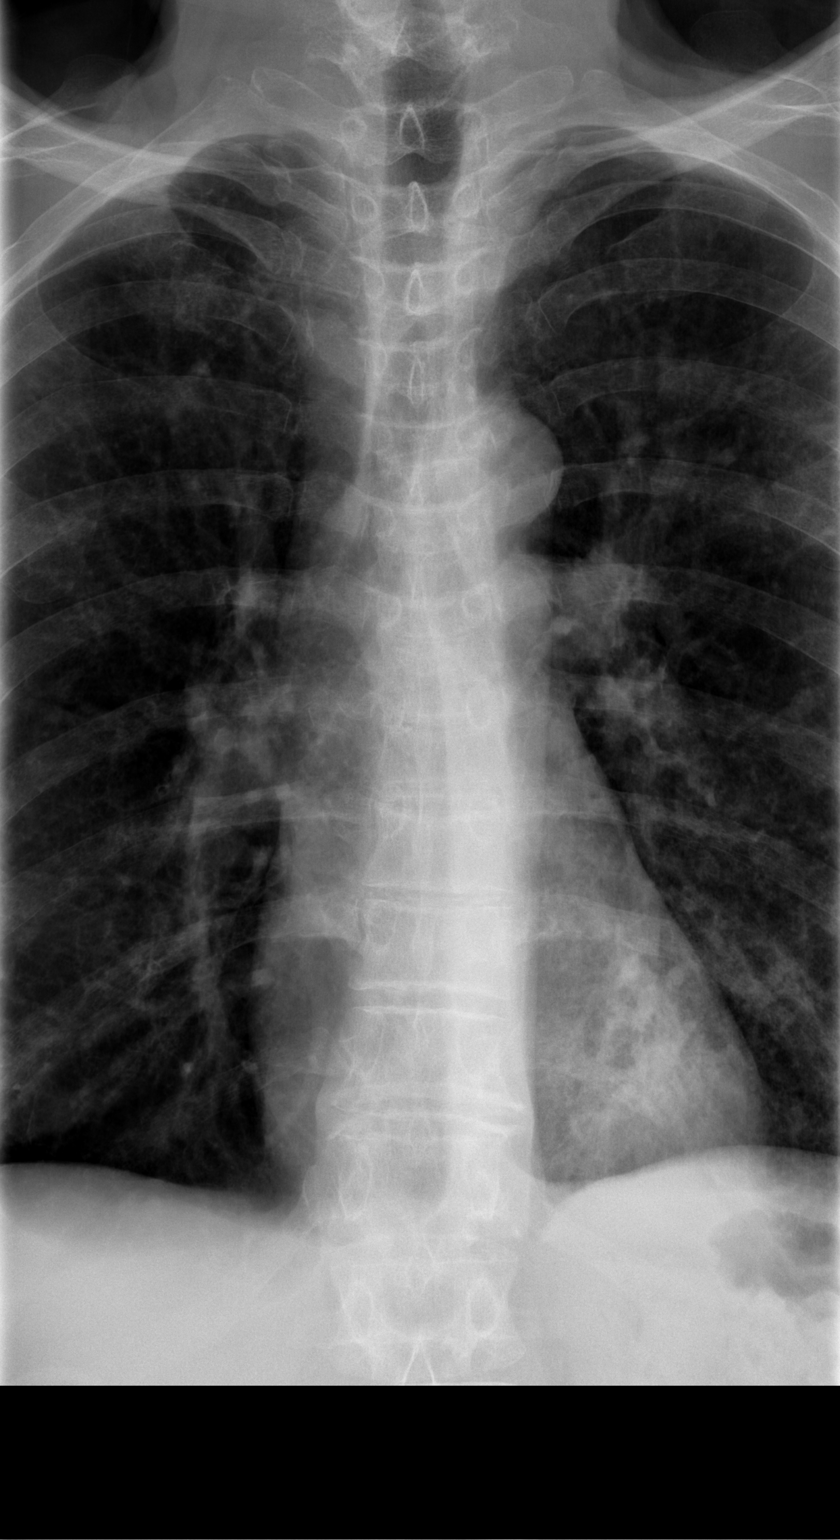

[1 of 1 positions shown; findings below may reference images not displayed]

FINDINGS: The pedicles are within normal limits. Mild degenerative changes of
the thoracic spine are seen. Vertebral body height is well
maintained. No paraspinal mass lesion is noted.
IMPRESSION: Mild degenerative change without acute abnormality. Diffuse
interstitial changes are noted within both lungs stable from the
prior exams

## 2017-02-28 IMAGING — CR DG LUMBAR SPINE COMPLETE 4+V
5 series · 5 of 5 positions shown · non-contrast
Comparison: 06/05/2013 MRI.

CLINICAL DATA: RIGHT sided back pain.  No injury.

EXAM:
LUMBAR SPINE - COMPLETE 4+ VIEW

[t l-spine a.p.]
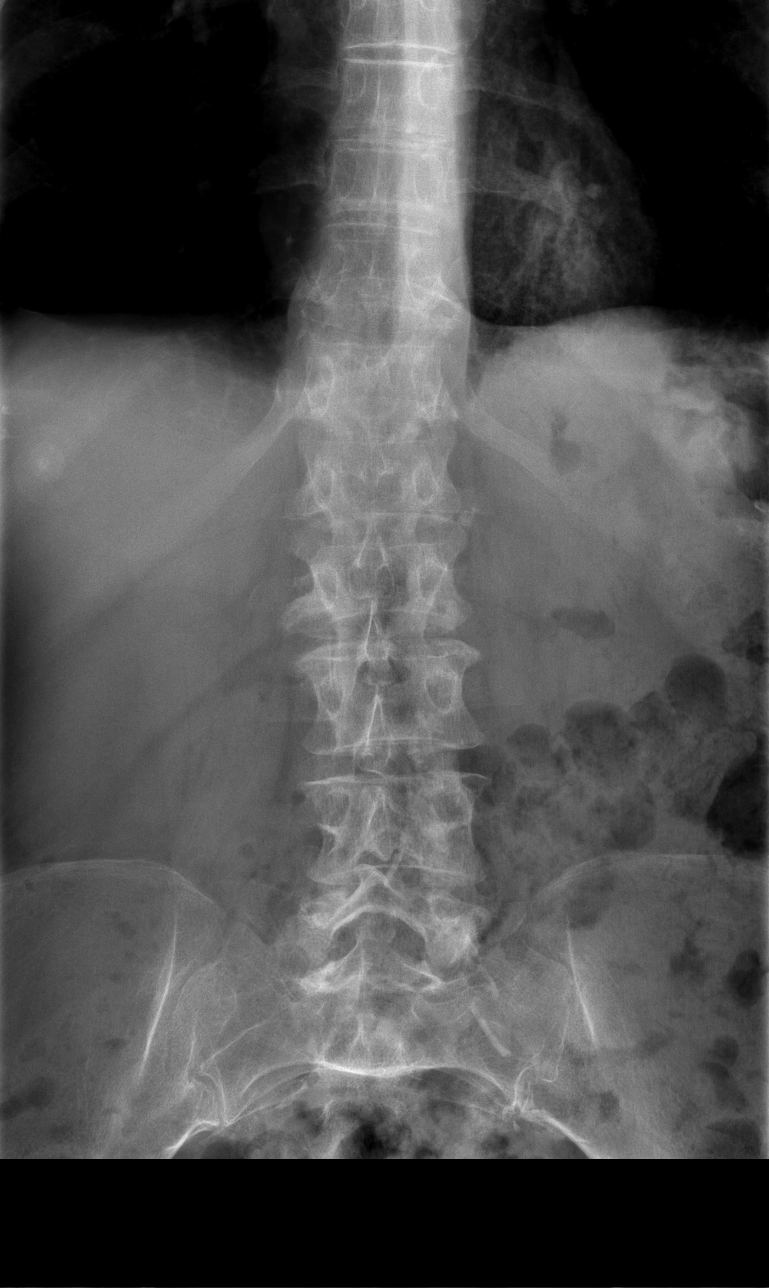

[t l-spine oblique exposure (1 of 2)]
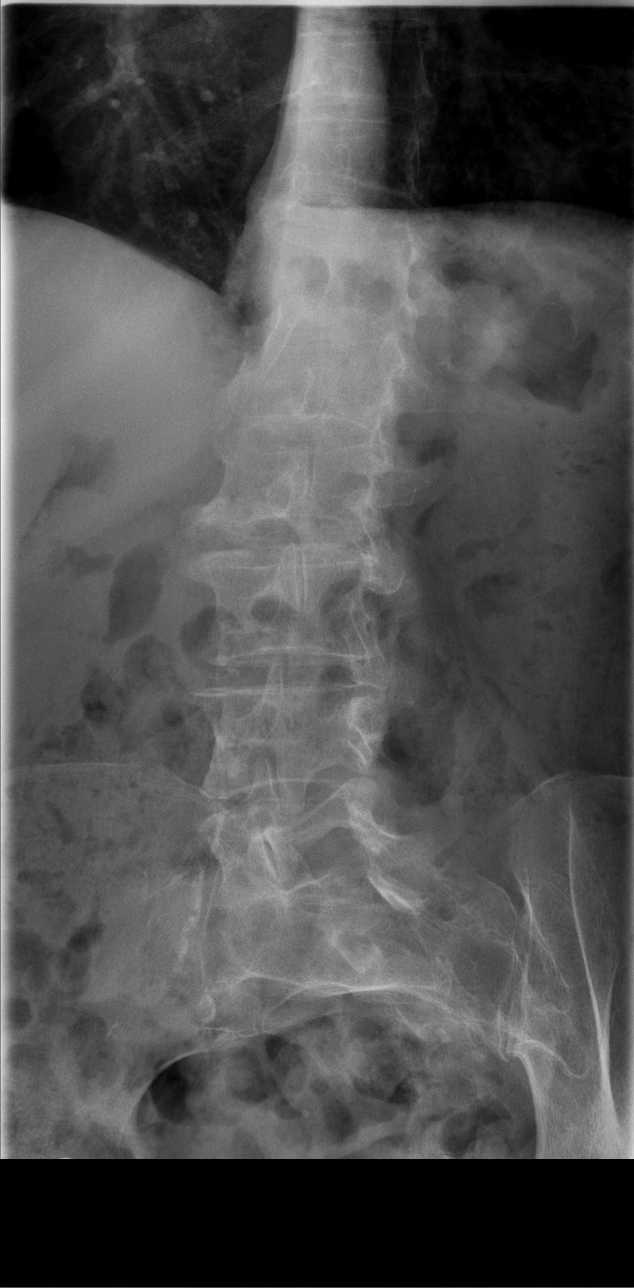

[t l-spine oblique exposure (2 of 2)]
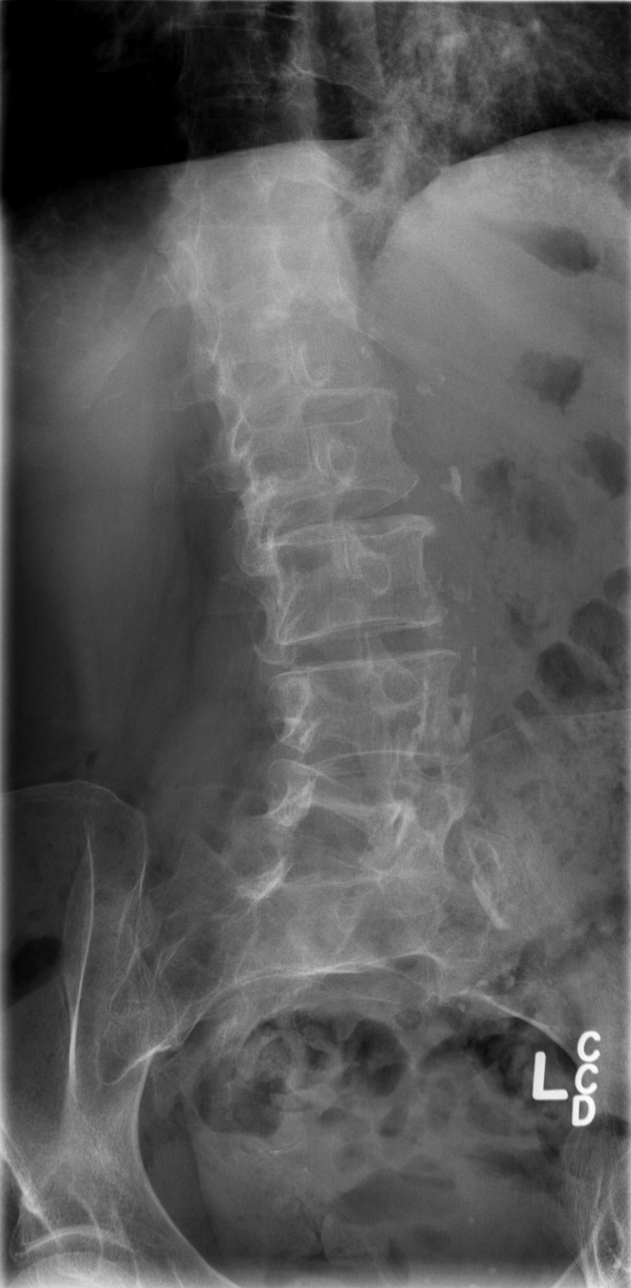

[t l-spine lat]
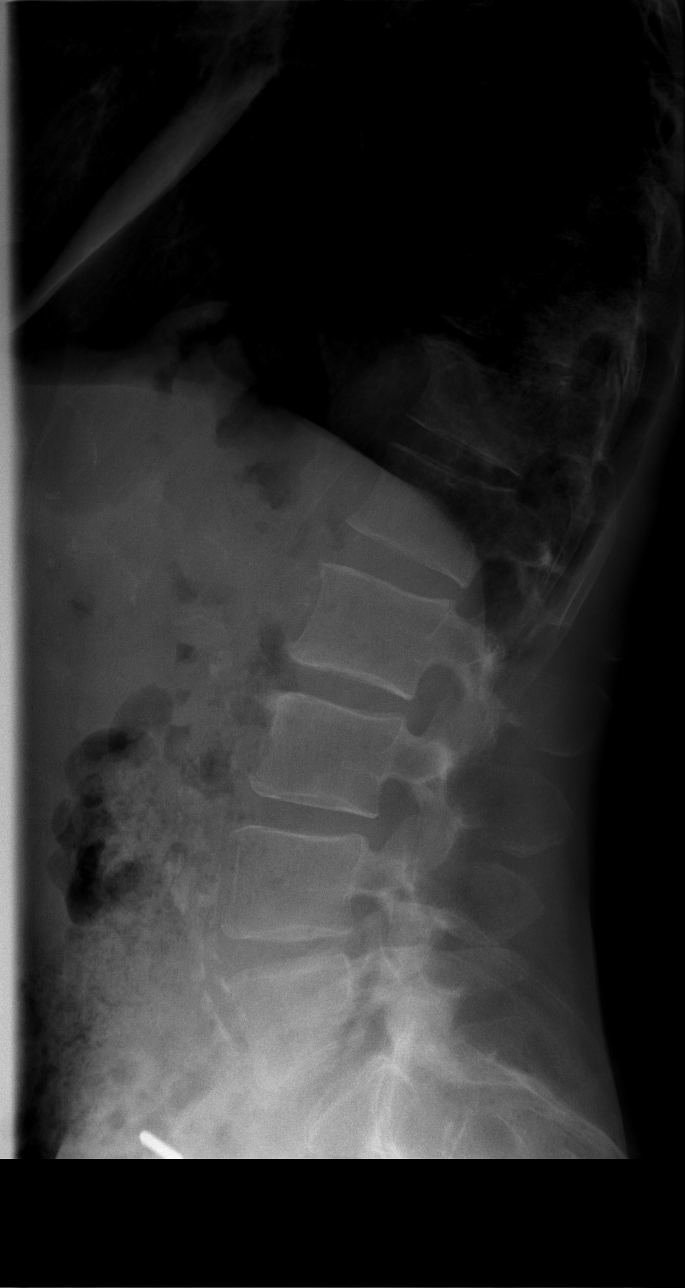

[t l-spine l5-s1 spot]
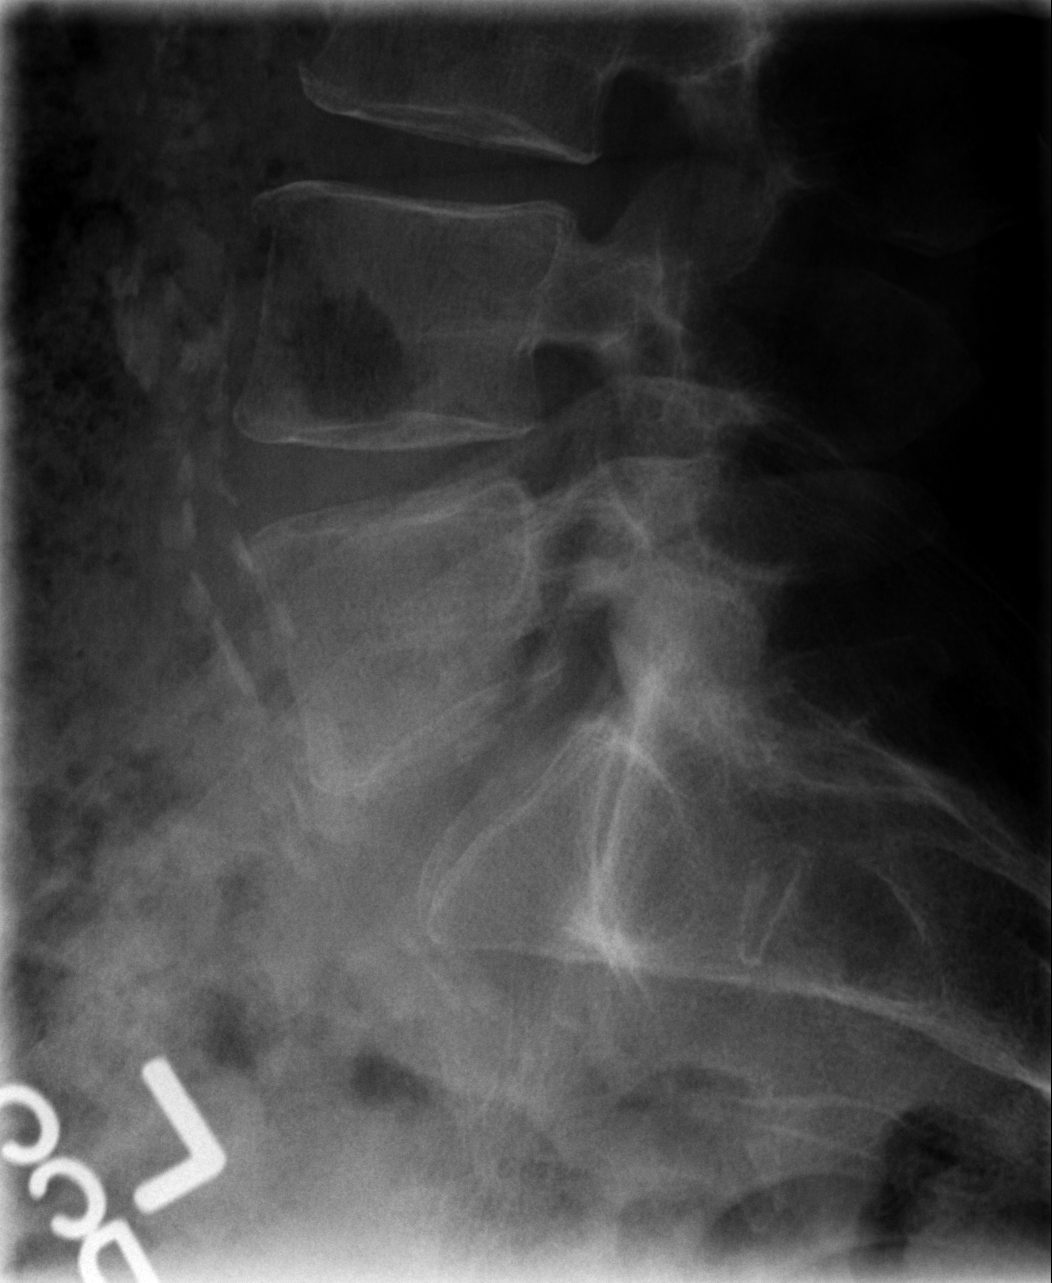

[5 of 5 positions shown; findings below may reference images not displayed]

FINDINGS: Lower lumbar facet arthropathy. Exaggerated lordosis. Trace
anterolisthesis L5 on L4 and S1. This appears to be related to facet
disease as there are no pars defects. Slight lateral spurring on the
RIGHT @ L2-L3. Mild vascular calcification.
IMPRESSION: No acute findings.

## 2017-02-28 IMAGING — CR DG THORACIC SPINE 2V
2 series · 2 of 2 positions shown · non-contrast
Comparison: 01/18/2015

CLINICAL DATA: Right-sided back pain, no known injury, initial
encounter

EXAM:
THORACIC SPINE - 2 VIEW

[t t-spine lat]
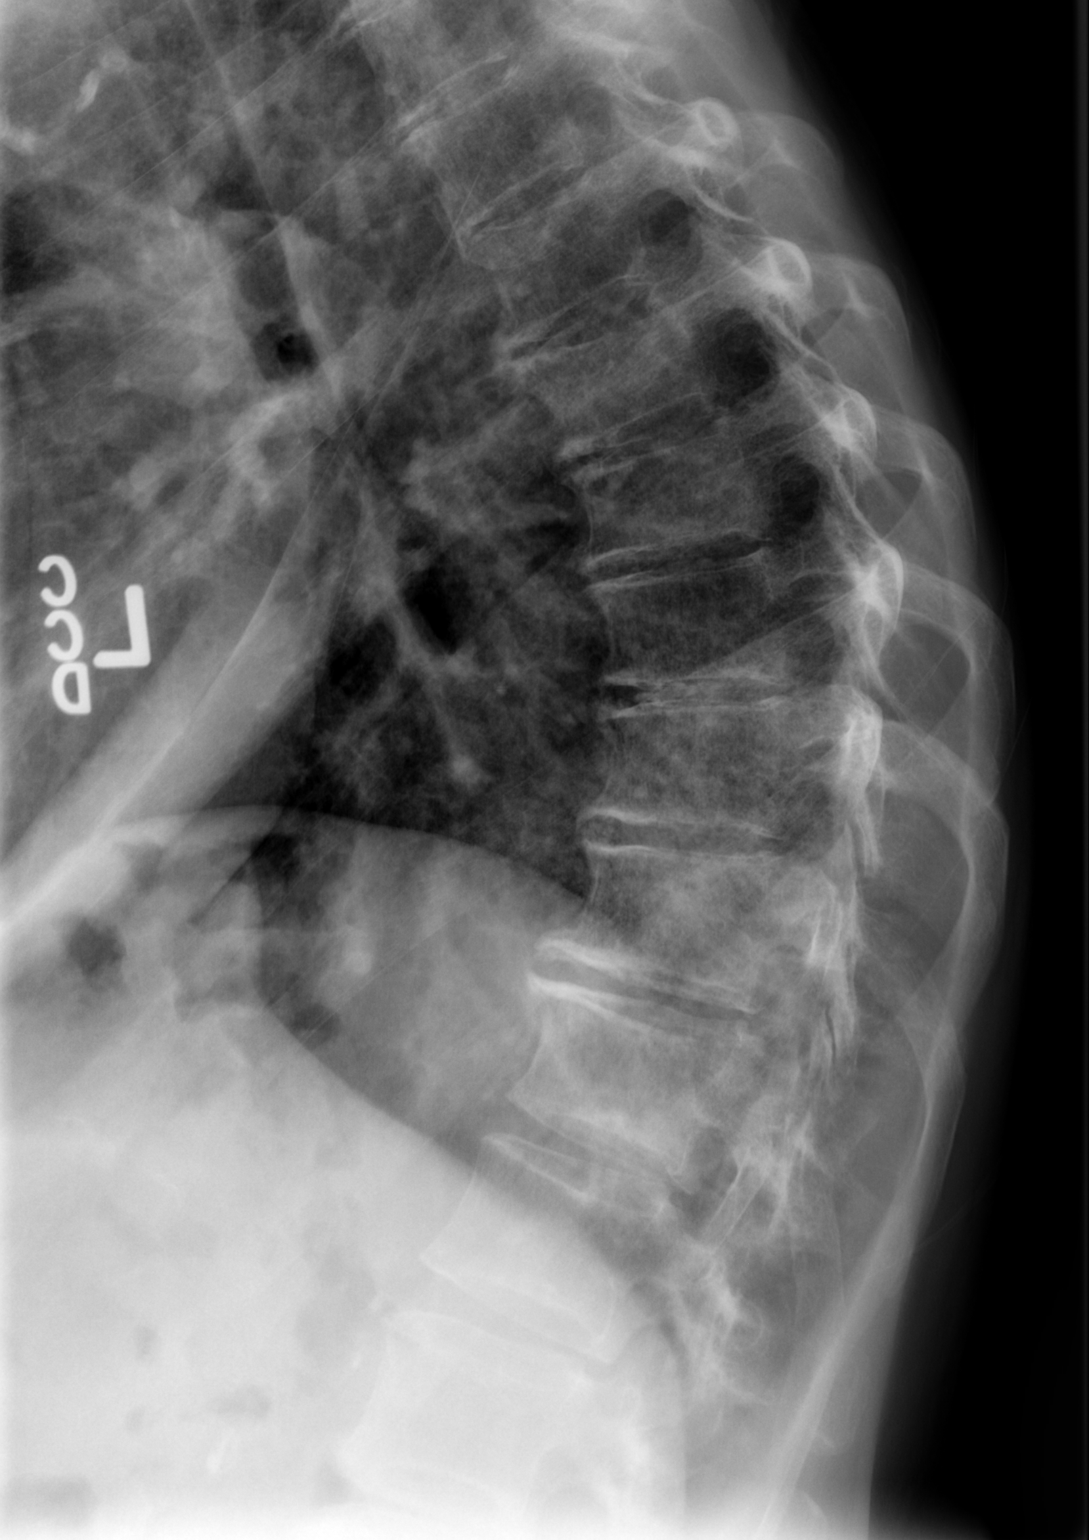

[t swimmers]
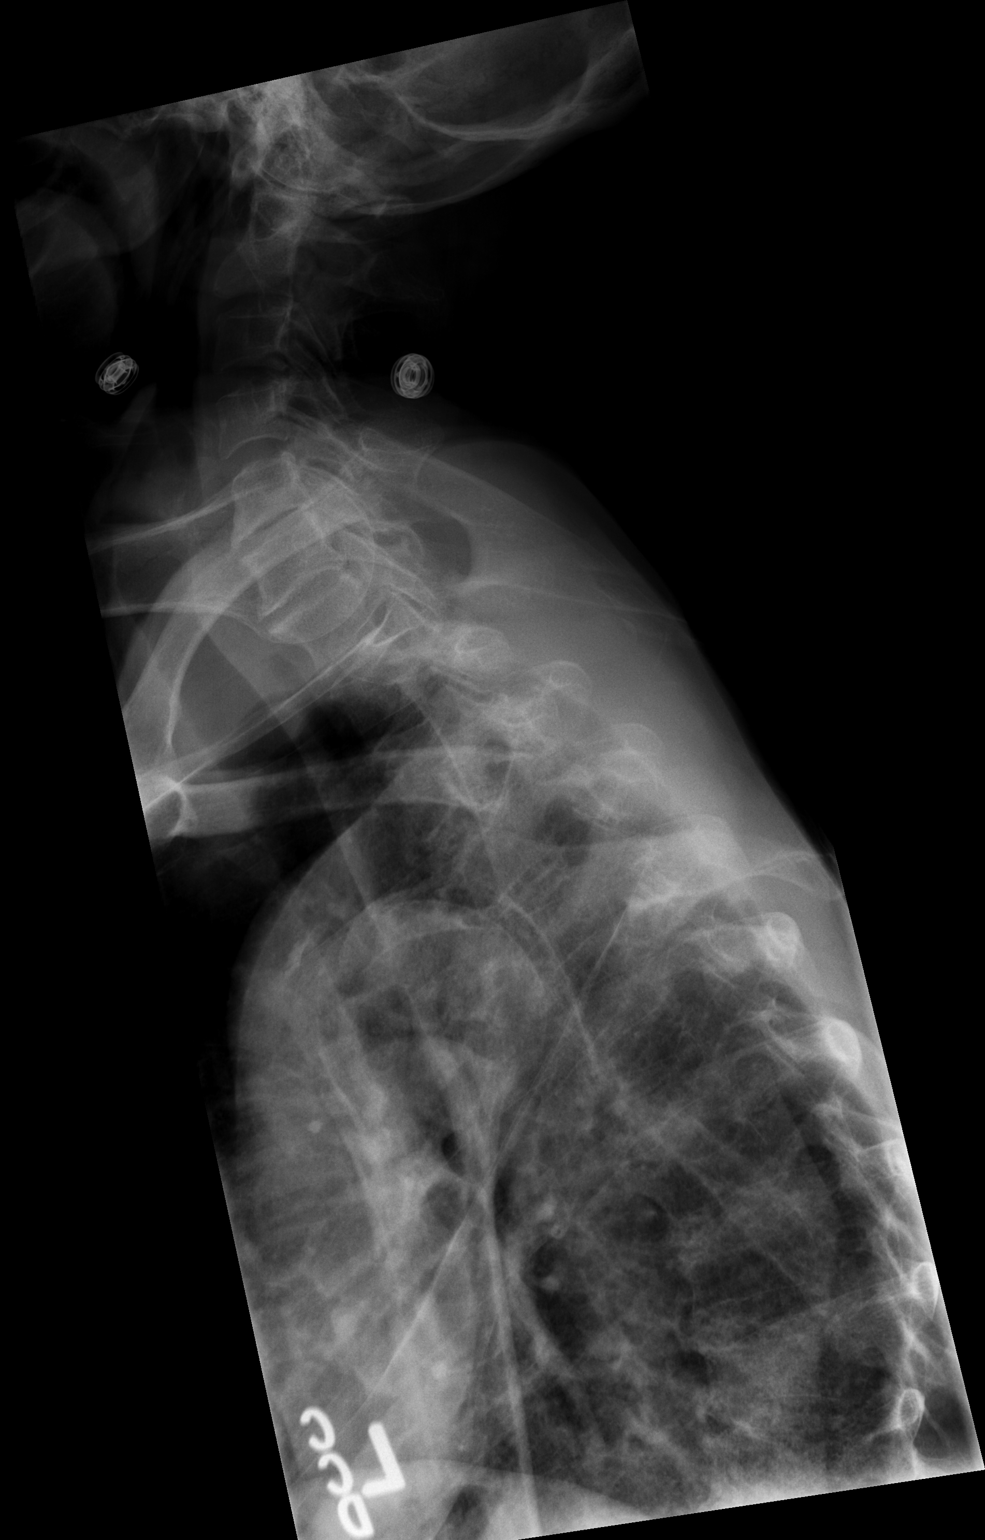

[2 of 2 positions shown; findings below may reference images not displayed]

FINDINGS: The pedicles are within normal limits. Mild degenerative changes of
the thoracic spine are seen. Vertebral body height is well
maintained. No paraspinal mass lesion is noted.
IMPRESSION: Mild degenerative change without acute abnormality. Diffuse
interstitial changes are noted within both lungs stable from the
prior exams

## 2017-04-23 IMAGING — CR DG CHEST 2V
3 series · 3 of 3 positions shown · non-contrast
Comparison: 02/06/2015

CLINICAL DATA: Seizure. Cough and shortness of breath. History of
asthma and COPD. Fall.

EXAM:
CHEST  2 VIEW

[w chest lat]
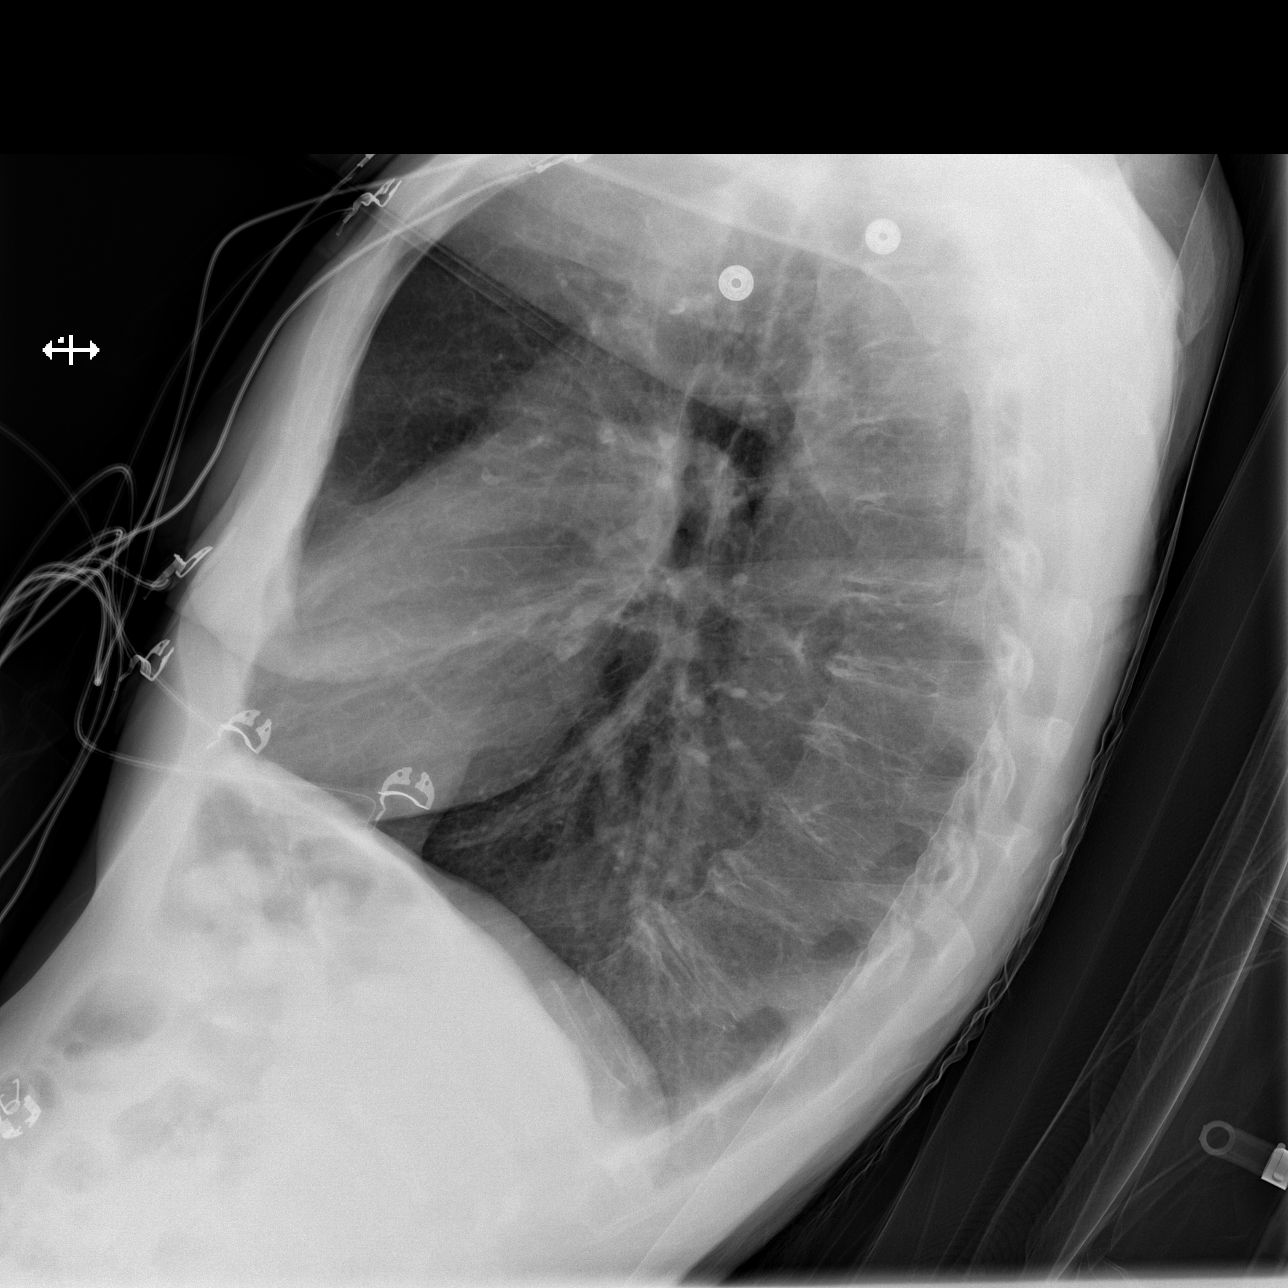

[x chest ap (1 of 2)]
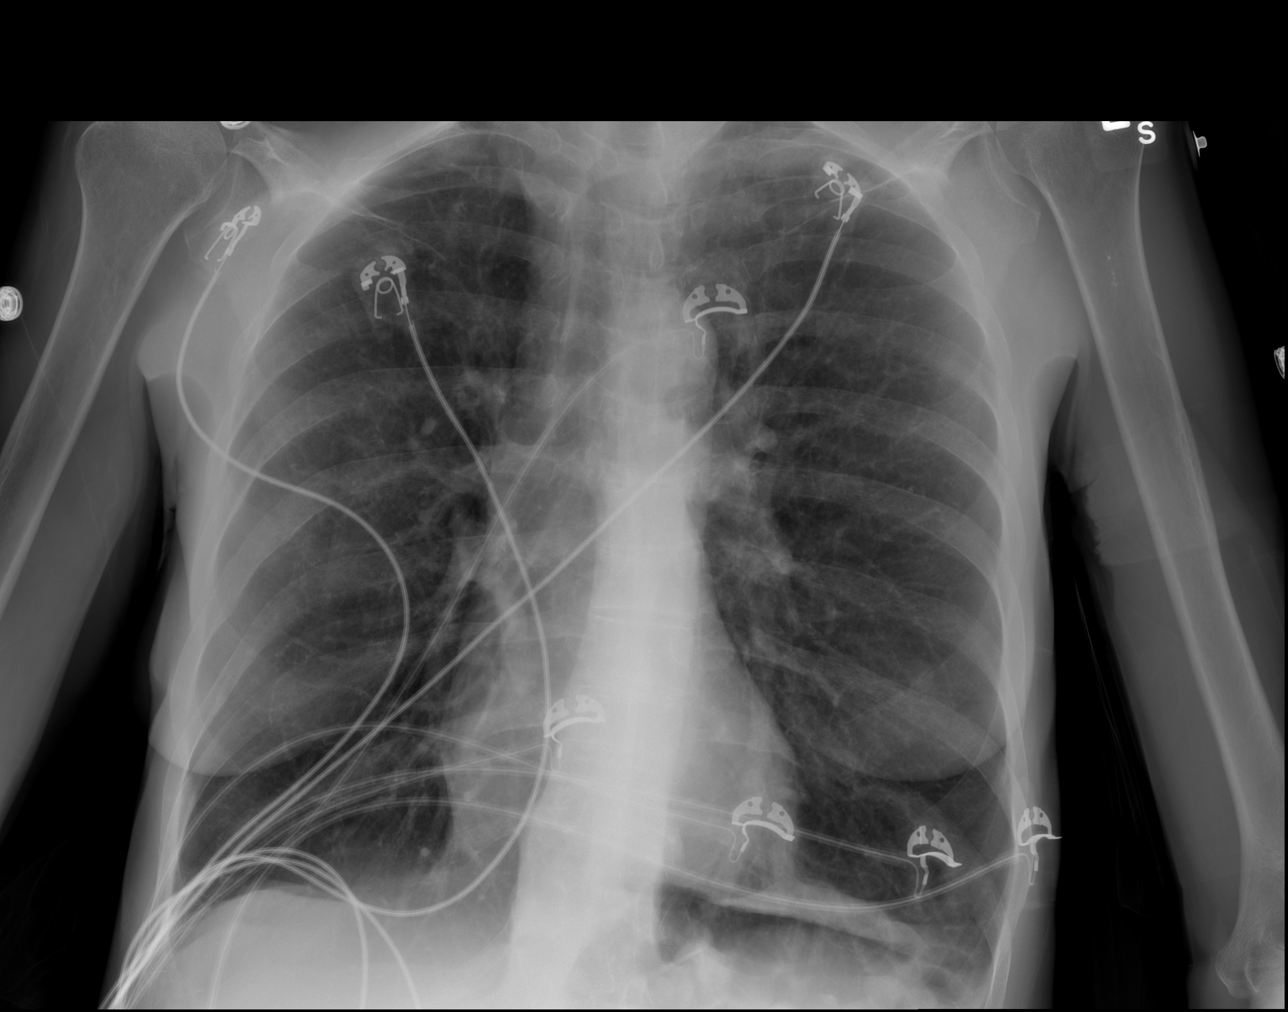

[x chest ap (2 of 2)]
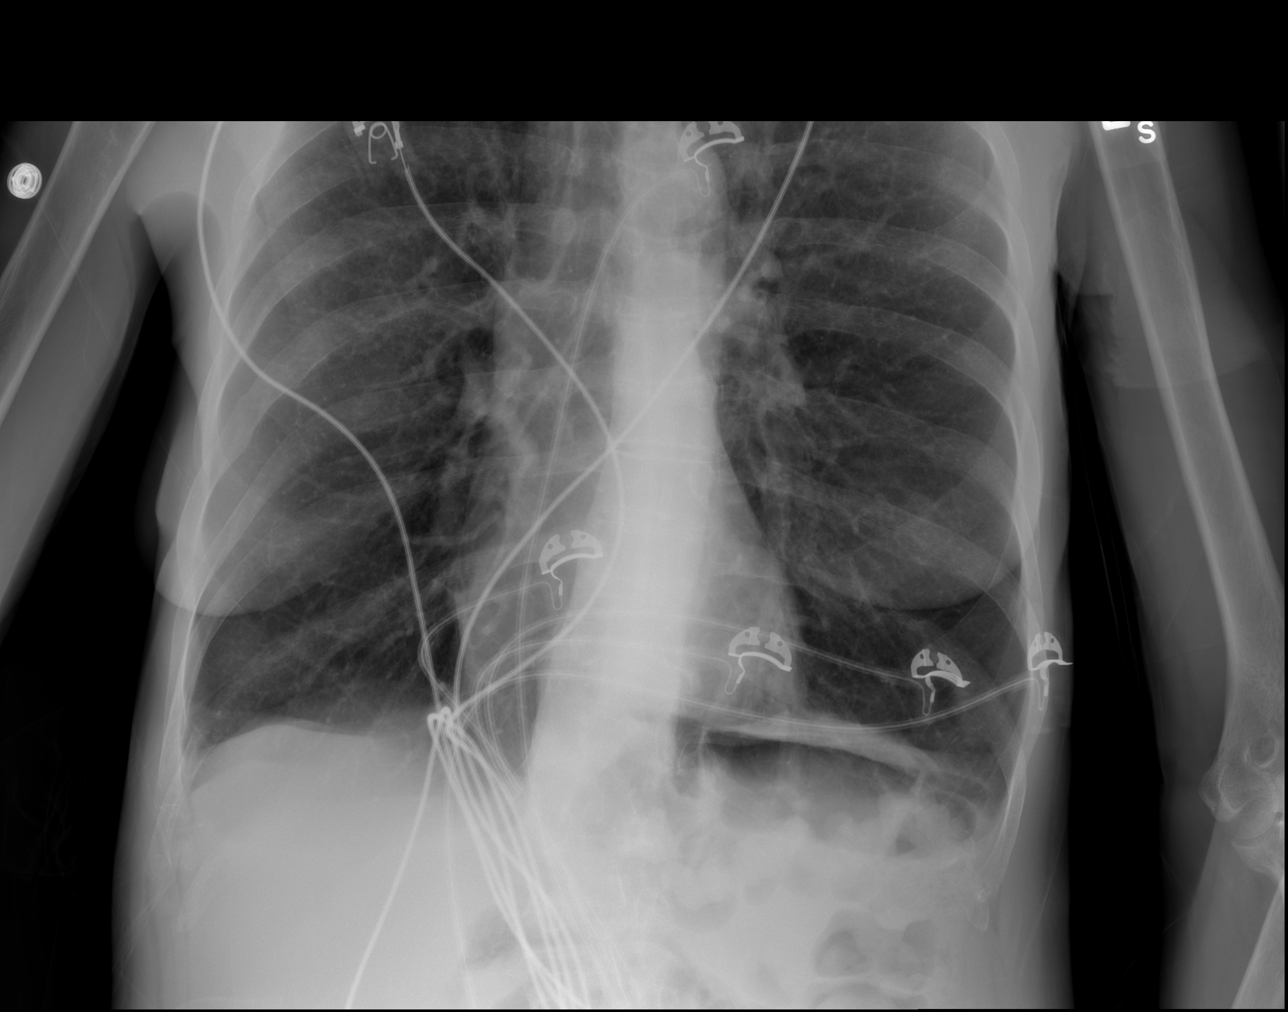

[3 of 3 positions shown; findings below may reference images not displayed]

FINDINGS: Hyperinflation. Thoracic spondylosis with mild osteopenia. Numerous
leads and wires project over the chest. Suspect remote trauma
involving posterior lateral right ribs. Midline trachea. Normal
heart size and mediastinal contours. No pleural effusion or
pneumothorax. Clear lungs.
IMPRESSION: Hyperinflation/COPD, without acute superimposed process.

## 2017-05-09 IMAGING — CR DG CHEST 2V
2 series · 2 of 2 positions shown · non-contrast
Comparison: 03/14/2015

CLINICAL DATA: Chest pain, shortness of breath, RIGHT side chest
pain and pressure for few days, history COPD, asthma, smoking

EXAM:
CHEST  2 VIEW

[chest pa]
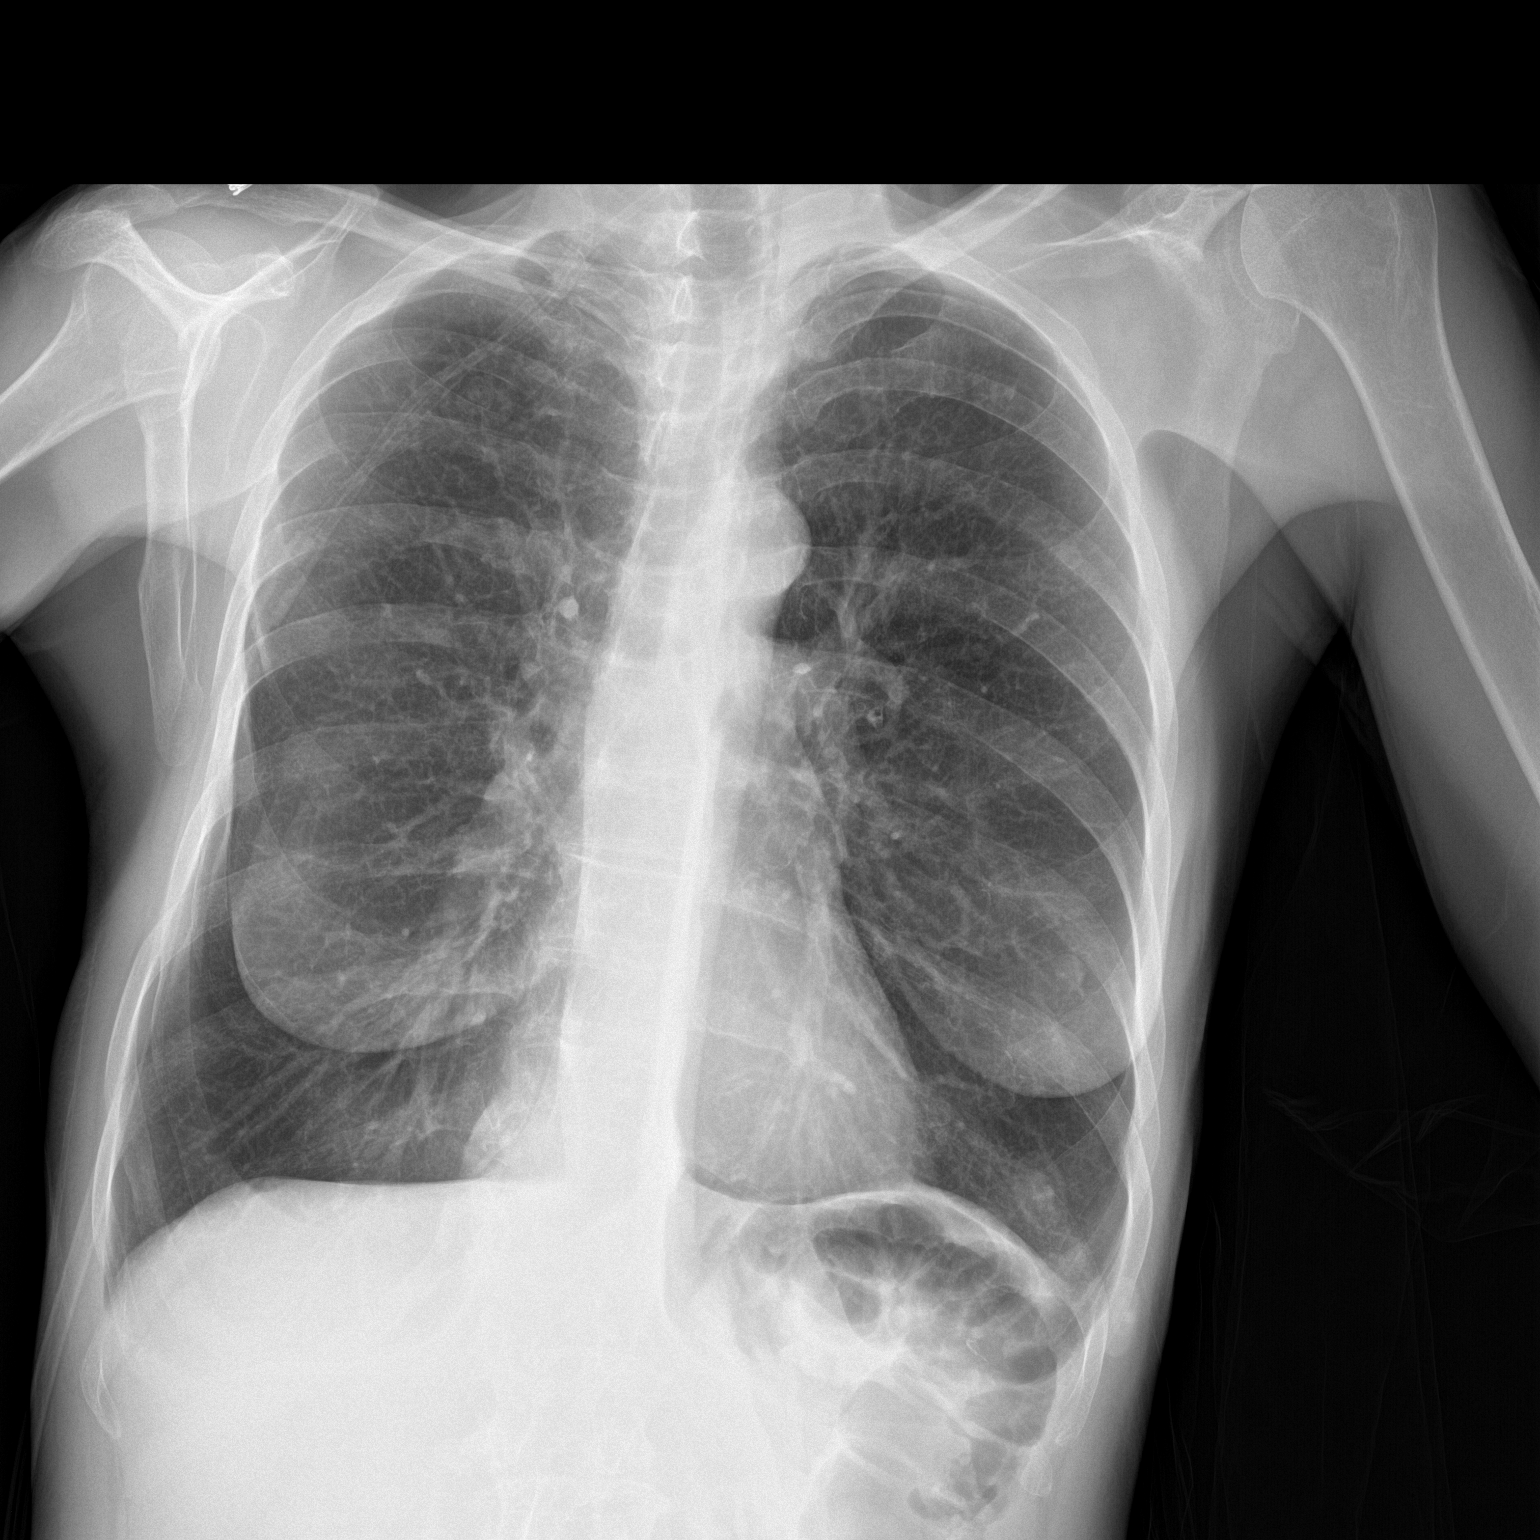

[chest lat]
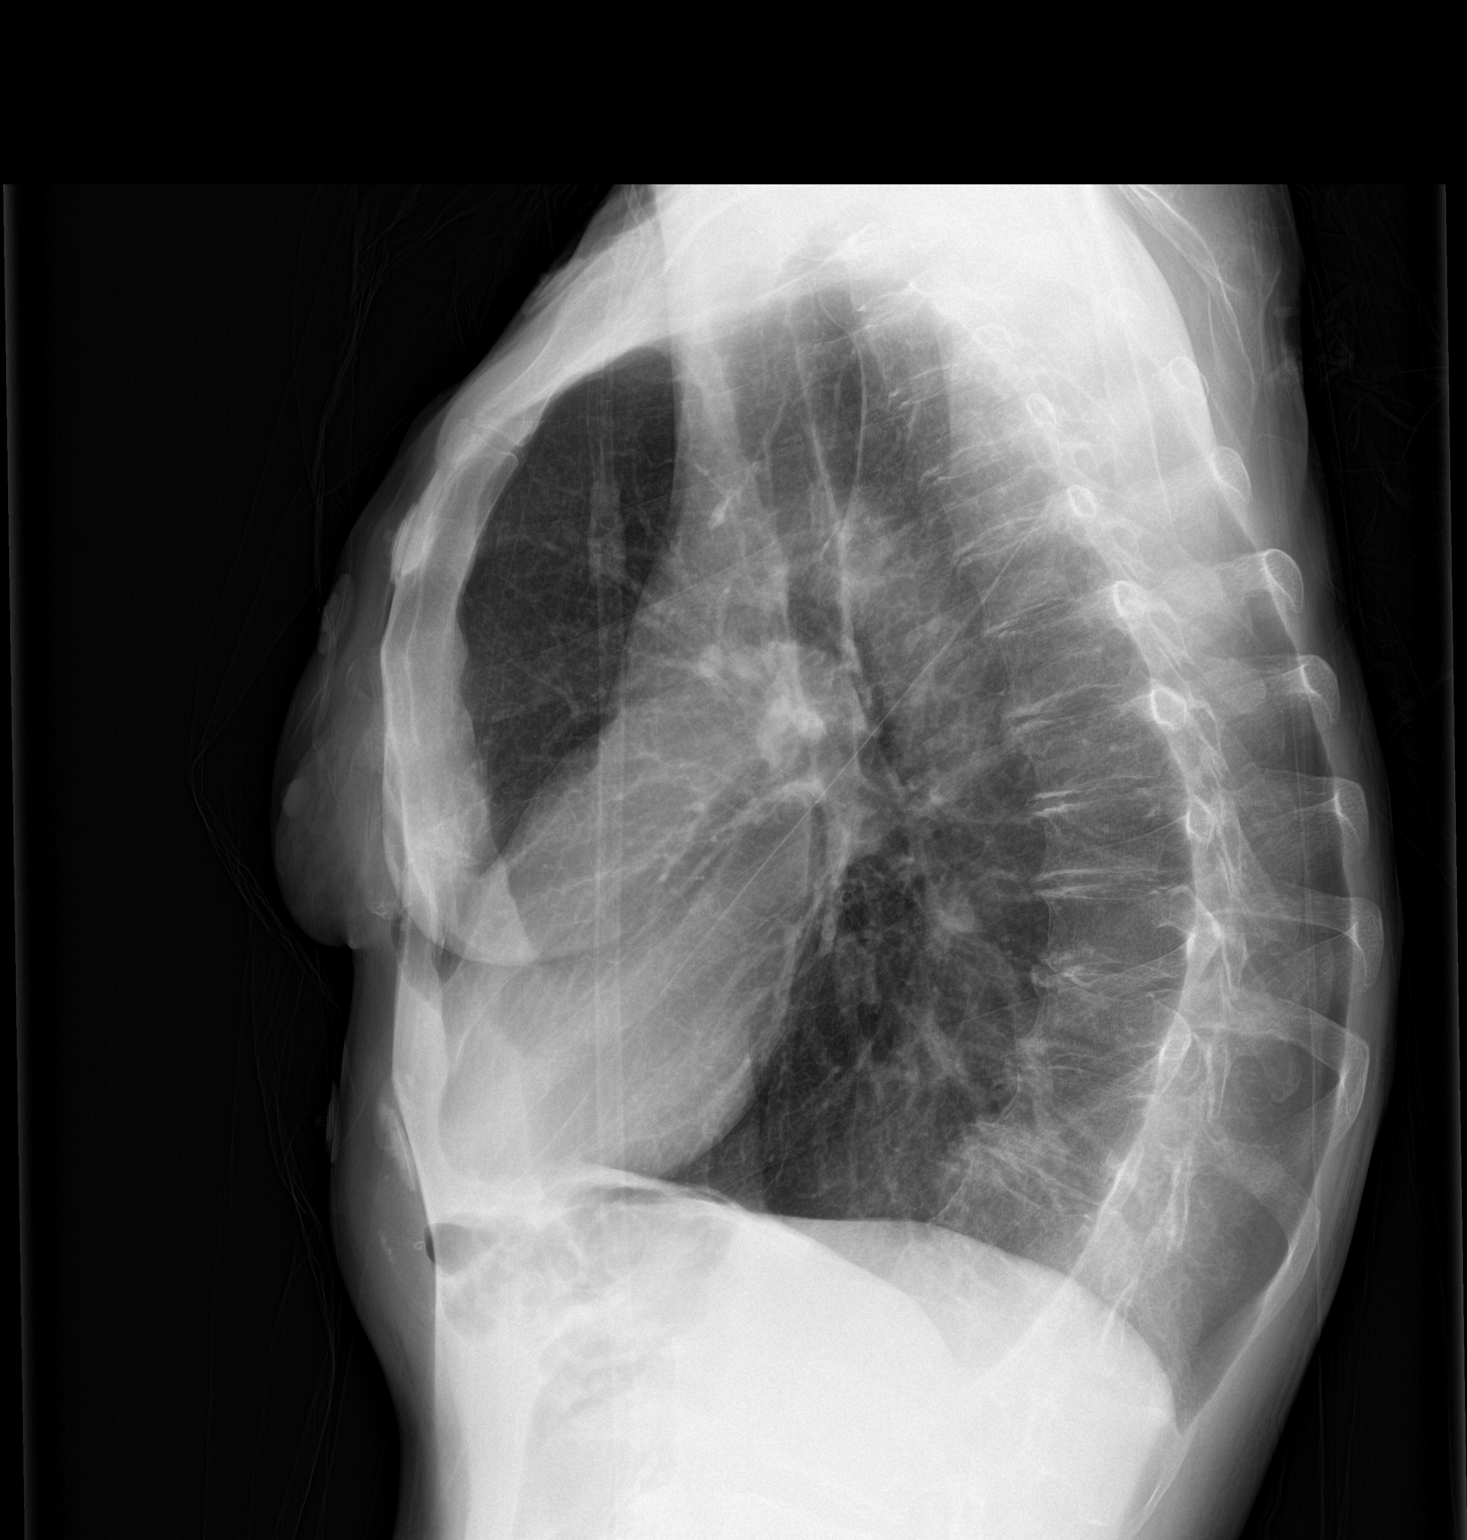

[2 of 2 positions shown; findings below may reference images not displayed]

FINDINGS: Normal heart size, mediastinal contours, and pulmonary vascularity.

LEFT nipple shadow again seen.

Emphysematous changes without infiltrate, pleural effusion or
pneumothorax.

O2 tubing projects over RIGHT upper lobe.

Bones demineralized.
IMPRESSION: COPD changes without acute infiltrate.

## 2017-08-05 ENCOUNTER — Encounter (HOSPITAL_COMMUNITY): Payer: Self-pay

## 2017-08-05 ENCOUNTER — Emergency Department (HOSPITAL_COMMUNITY)
Admission: EM | Admit: 2017-08-05 | Discharge: 2017-08-05 | Disposition: A | Discharge status: Home / Self Care | Payer: Medicare HMO | Attending: Emergency Medicine | Admitting: Emergency Medicine

## 2017-08-05 ENCOUNTER — Emergency Department (HOSPITAL_COMMUNITY): Payer: Medicare HMO

## 2017-08-05 ENCOUNTER — Other Ambulatory Visit: Payer: Self-pay

## 2017-08-05 DIAGNOSIS — J45909 Unspecified asthma, uncomplicated: Secondary | ICD-10-CM | POA: Diagnosis not present

## 2017-08-05 DIAGNOSIS — F17210 Nicotine dependence, cigarettes, uncomplicated: Secondary | ICD-10-CM | POA: Insufficient documentation

## 2017-08-05 DIAGNOSIS — J449 Chronic obstructive pulmonary disease, unspecified: Secondary | ICD-10-CM | POA: Insufficient documentation

## 2017-08-05 DIAGNOSIS — M5412 Radiculopathy, cervical region: Secondary | ICD-10-CM | POA: Insufficient documentation

## 2017-08-05 DIAGNOSIS — M79601 Pain in right arm: Secondary | ICD-10-CM | POA: Insufficient documentation

## 2017-08-05 DIAGNOSIS — M542 Cervicalgia: Secondary | ICD-10-CM | POA: Diagnosis not present

## 2017-08-05 DIAGNOSIS — R0781 Pleurodynia: Secondary | ICD-10-CM | POA: Insufficient documentation

## 2017-08-05 DIAGNOSIS — S4991XA Unspecified injury of right shoulder and upper arm, initial encounter: Secondary | ICD-10-CM | POA: Diagnosis not present

## 2017-08-05 DIAGNOSIS — Z79899 Other long term (current) drug therapy: Secondary | ICD-10-CM | POA: Diagnosis not present

## 2017-08-05 DIAGNOSIS — G8929 Other chronic pain: Secondary | ICD-10-CM | POA: Diagnosis not present

## 2017-08-05 DIAGNOSIS — R079 Chest pain, unspecified: Secondary | ICD-10-CM | POA: Diagnosis not present

## 2017-08-05 DIAGNOSIS — S299XXA Unspecified injury of thorax, initial encounter: Secondary | ICD-10-CM | POA: Diagnosis not present

## 2017-08-05 DIAGNOSIS — R569 Unspecified convulsions: Secondary | ICD-10-CM | POA: Diagnosis not present

## 2017-08-05 MED ORDER — METHOCARBAMOL 500 MG PO TABS
750.0000 mg | ORAL_TABLET | Freq: Once | ORAL | Status: DC
  Filled 2017-08-05: qty 2

## 2017-08-05 MED ORDER — PREDNISONE 10 MG (21) PO TBPK: ORAL_TABLET | Freq: Every day | ORAL | 0 refills | Status: DC

## 2017-08-05 MED ORDER — PREDNISONE 20 MG PO TABS
60.0000 mg | ORAL_TABLET | Freq: Once | ORAL | Status: AC
Start: 2017-08-05 — End: 2017-08-05
  Administered 2017-08-05: 60 mg via ORAL
  Filled 2017-08-05: qty 3

## 2017-08-05 MED ORDER — HYDROCODONE-ACETAMINOPHEN 5-325 MG PO TABS
1.0000 | ORAL_TABLET | Freq: Once | ORAL | Status: AC
  Administered 2017-08-05: 1 via ORAL
  Filled 2017-08-05: qty 1

## 2017-08-05 NOTE — ED Notes (Signed)
Pt ask for xray of arm. Pt questions why none was done. Same information relayed to PA who staes she has already explained this to patient.

## 2017-08-05 NOTE — ED Notes (Signed)
Pt c/o pain to back after seizure on Wednesday.

## 2017-08-05 NOTE — ED Provider Notes (Signed)
[facility] EMERGENCY DEPARTMENT Provider Note   CSN: [id number] Arrival date & time: 08/05/17  3267     History   Chief Complaint No chief complaint on file.   HPI [name] is a 62 y.o. female.  HPI [name] is a 62 y.o. female presents to emergency department complaining of pain to her neck, right ribs, right arm.  Patient reports having a seizure 6 days ago.  She states it was witnessed by her mother-in-law who said that she started seizing and fell down on her couch.  She did not sustain any apparent injuries at that time.  She states however, since the seizure, she has had severe pain to her neck, that radiates around to the right scapula and right ribs, and down the entire right arm.  She denies any numbness or tingling.  She states she has been taking Mobic and using heating pads with no relief.  She states pain is getting worse instead of better, and has been extremely severe in the last 2 days.  She reports being compliant with her seizure medications.  She states that she has breakthrough seizures due to stress and she has been under a lot of stress recently.  She denies any other injuries.  She has history of lumbar fractures from prior seizures.  Past Medical History:  Diagnosis Date  . Asthma   . 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 Chronic back pain   . COPD (chronic obstructive pulmonary disease) ([place])   . DDD (degenerative disc disease) 06/12/2013  . DVT (deep venous thrombosis) ([place])    "she's had several since 1990"  . GERD (gastroesophageal reflux disease)   . Headache    "usually around time when she's had a seizure"  . History of blood transfusion    "when she was a baby"  . History of stomach ulcers   . Kidney stones   . Seizures ([place])    "because of her brain surgery"  . Stroke [facility]) [phone]   family denies residual on 11/09/2014  . Tunnel vision    "since brain OR"    Patient Active Problem List   Diagnosis Date Noted  .  Atypical chest pain 02/17/2017  . Localized swelling of lower extremity 02/17/2017  . GERD (gastroesophageal reflux disease) 02/17/2017  . Costochondritis 02/17/2017  . Seizures ([place]) 11/02/2015  . Gait disturbance 11/02/2015  . Right clavicle fracture 11/02/2015  . Infection of urinary tract 11/02/2015  . Chest pain 03/31/2015  . Acute on chronic respiratory failure with hypoxia ([place]) 03/31/2015  . SOB (shortness of breath) 03/31/2015  . Anxiety state 03/31/2015  . Malnutrition of moderate degree ([place]) 03/31/2015  . COPD exacerbation ([place]) 03/30/2015  . Hypoxia 01/18/2015  . Chronic obstructive pulmonary disease with acute exacerbation ([place])   . Fever 01/17/2015  . Cough 01/17/2015  . Sepsis ([place]) 01/17/2015  . COPD (chronic obstructive pulmonary disease) ([place])   . Unintentional weight loss 01/11/2015  . Asthma 05/15/2014  . Seizure ([place]) 05/15/2014  . Protein calorie malnutrition ([place]) 05/15/2014  . DVT of leg (deep venous thrombosis) ([place]) 06/19/2013  . DDD (degenerative disc disease) 06/12/2013  . Paresthesias 06/12/2013  . Tremor 06/09/2013  . Thrombocytopenia- chronic 06/29/2012  . Noncompliance with medication treatment due to underuse of medication 06/29/2012  . Stress-personal 06/29/2012  . Recurrent seizures ([place]) 06/29/2012  . Tobacco dependence 06/28/2012  . Skull fracture ([place]) 02/28/2012  . Dilantin toxicity 02/28/2012  . Seizure disorder ([place]) 02/28/2012  .  History of Intracranial bleed 02/28/2012    Past Surgical History:  Procedure Laterality Date  . [place]   craniotomy with hematoma evacuation  . CESAREAN SECTION  1979; 1987; 1989  . HEMORRHOID SURGERY    . TUBAL LIGATION  1990's    OB History    No data available       Home Medications    Prior to Admission medications   Medication Sig Start Date End Date Taking? Authorizing Provider  lacosamide (VIMPAT) 200 MG TABS tablet Take 200 mg by mouth 2 (two) times daily.    [provider]  pantoprazole (PROTONIX) 40 MG tablet Take 1 tablet (40 mg total) by mouth daily. 02/18/17   Johnson, Clanford L, MD  phenytoin (DILANTIN) 100 MG ER capsule Take 2 capsules (200 mg total) by mouth at bedtime. 02/19/17   Johnson, Clanford L, MD  vitamin B-12 (CYANOCOBALAMIN) 1000 MCG tablet Take 1 tablet (1,000 mcg total) by mouth daily. 02/18/17   Murlean Iba, MD    Family History Family History  Problem Relation Age of Onset  . Heart attack Mother        4 MIs, started in her 37s, also vaginal cancer and colon cancer  . Cancer Mother   . Heart attack Father        Died suddenly age 63 - autopsy revealed heart attack her patient  . Cancer Brother        Sinus cancer    Social History Social History   Tobacco Use  . Smoking status: Current Every Day Smoker    Packs/day: 0.50    Years: 20.00    Pack years: 10.00    Types: Cigarettes  . Smokeless tobacco: Never Used  . Tobacco comment: Previously smoked 2 ppd, down to 4 cigs/day  Substance Use Topics  . Alcohol use: No  . Drug use: No     Allergies   Zonisamide; Codeine; Keppra [levetiracetam]; Pregabalin; and Olive oil   Review of Systems Review of Systems  Constitutional: Negative for chills and fever.  Respiratory: Negative for cough, chest tightness and shortness of breath.   Cardiovascular: Negative for chest pain, palpitations and leg swelling.  Gastrointestinal: Negative for abdominal pain, diarrhea, nausea and vomiting.  Genitourinary: Negative for dysuria, flank pain, pelvic pain, vaginal bleeding, vaginal discharge and vaginal pain.  Musculoskeletal: Positive for arthralgias, myalgias, neck pain and neck stiffness.  Skin: Negative for rash.  Neurological: Positive for seizures. Negative for dizziness, weakness, numbness and headaches.  All other systems reviewed and are negative.    Physical Exam Updated Vital Signs BP 124/85 (BP Location: Right Arm)   Pulse 78   Temp 98.3 F (36.8  C) (Oral)   Resp 18   SpO2 100%   Physical Exam  Constitutional: She is oriented to person, place, and time. She appears well-developed and well-nourished. No distress.  HENT:  Head: Normocephalic.  Eyes: Conjunctivae are normal.  Neck:  Tenderness to palpation over midline cervical spine and right paracervical spinal muscles.    Cardiovascular: Normal rate, regular rhythm and normal heart sounds.  Pulmonary/Chest: Effort normal and breath sounds normal. No respiratory distress. She has no wheezes. She has no rales.  ttp over right lateral and posterior ribs  Musculoskeletal: She exhibits no edema.  Pain is over the trapezius, tenderness to palpation or over the right scapula, periscapular muscles.  Full range of motion of the shoulder, elbow, wrist with no obvious swelling or deformity.  Distal  radial pulses intact.  Grip strength is 5 out of 5 and equal bilaterally.   Neurological: She is alert and oriented to person, place, and time.  Skin: Skin is warm and dry.  Psychiatric: She has a normal mood and affect. Her behavior is normal.  Nursing note and vitals reviewed.    ED Treatments / Results  Labs (all labs ordered are listed, but only abnormal results are displayed) Labs Reviewed - No data to display  EKG  EKG Interpretation None       Radiology Dg Ribs Unilateral W/chest Right  Result Date: 08/05/2017 CLINICAL DATA:  Seizure with fall and right-sided chest pain. EXAM: RIGHT RIBS AND CHEST - 3+ VIEW COMPARISON:  02/17/2017 FINDINGS: Heart size is normal. Chronic aortic atherosclerosis. The lungs are clear. No pneumothorax or hemothorax. Old healed right clavicle fracture and right lateral rib fractures. No acute rib fracture is seen. IMPRESSION: No active cardiopulmonary disease. No evidence of acute bony injury. Old healed right clavicle fracture. Old right rib fracture, at least at rib 7. Electronically Signed   By: Nelson Chimes M.D.   On: 08/05/2017 11:01   Dg  Scapula Right  Result Date: 08/05/2017 CLINICAL DATA:  Seizure.  Fall with right-sided pain. EXAM: RIGHT SCAPULA - 2+ VIEWS COMPARISON:  Same day FINDINGS: No evidence of acute clavicle fracture. Old healed clavicle fracture. No evidence of acute scapular fracture. Glenohumeral joint appears normal. No regional rib abnormality seen. Old healed fracture of rib 7. IMPRESSION: No acute finding. Electronically Signed   By: Nelson Chimes M.D.   On: 08/05/2017 11:02   Ct Cervical Spine Wo Contrast  Result Date: 08/05/2017 CLINICAL DATA:  Seizure with subsequent neck pain and back pain. EXAM: CT CERVICAL SPINE WITHOUT CONTRAST TECHNIQUE: Multidetector CT imaging of the cervical spine was performed without intravenous contrast. Multiplanar CT image reconstructions were also generated. COMPARISON:  Radiography 11/02/2015 FINDINGS: Alignment: No traumatic malalignment. Skull base and vertebrae: Mild motion degradation, but no evidence of fracture or acute traumatic finding. Soft tissues and spinal canal: Negative.  No traumatic finding. Disc levels: Degenerative spondylosis at C5-6 and C6-7 with endplate osteophytes. Facet arthritis on the left at C4-5 and C5-6. Mild foraminal narrowing on the right at C5-6. Upper chest: Negative Other: Old right PCA territory infarction. IMPRESSION: No acute or traumatic finding. Chronic spondylosis and facet arthropathy as outlined above. Electronically Signed   By: Nelson Chimes M.D.   On: 08/05/2017 11:36    Procedures Procedures (including critical care time)  Medications Ordered in ED Medications  methocarbamol (ROBAXIN) tablet 750 mg (not administered)     Initial Impression / Assessment and Plan / ED Course  I have reviewed the triage vital signs and the nursing notes.  Pertinent labs & imaging results that were available during my care of the patient were reviewed by me and considered in my medical decision making (see chart for details).     Pt with neck, right  scapula pain, right rib pain, right arm pain after having a seizure 6 days ago.  Exam unremarkable, however she has significant tenderness over cervical spine and tenderness over the scapula and ribs.  She is neurovascularly intact.  I will get imaging of the neck, scapula, ribs.  Robaxin ordered.  12:25 PM Patient's x-rays and CT are negative.  Patient continues to have severe pain.  She was given Vicodin and prednisone in emergency department.  She actually when I ordered Robaxin stated that she already took it this morning  when earlier she told me she did not take anything today.  She states she took it at 9 AM, when in reality patient was already in the department and patient continuously asking me to x-ray her arm.  She has full range of motion her wrist, elbow, shoulder with no significant pain.  She has no swelling in any of the joints.  She is neurovascular intact.  I do not think she needs an x-ray of her arm.  Suspect her pain in her arm is most likely radicular coming from her cervical spine.  We discussed starting prednisone taper, continue Robaxin that she already has, and following up with her primary care doctor.  Patient became upset that I am not prescribing her any opiate pain medications, I explained to her that we usually do not use that to treat minor injuries in department.  She stated she will follow-up as an outpatient.  Vitals:   08/05/17 1030 08/05/17 1045 08/05/17 1116 08/05/17 1117  BP:  (!) 104/44 116/65 116/65  Pulse: 62 68 63 75  Resp: 17 11 (!) 22 16  Temp:      TempSrc:      SpO2: 100% (!) 88% (!) 88% 100%     Final Clinical Impressions(s) / ED Diagnoses   Final diagnoses:  Cervical radiculopathy  Pain of right upper extremity  Rib pain on right side    ED Discharge Orders    None       Jeannett Senior, PA-C 08/05/17 1229    Dorie Rank, MD 08/05/17 1654

## 2017-08-05 NOTE — ED Notes (Signed)
Pt states she undertands instructions. Home stable via wc.

## 2017-08-05 NOTE — Discharge Instructions (Signed)
Your CT scan and your x-rays are all unremarkable today.  I believe the pain in your right arm is coming from a possibly pinched and inflamed nerve in your neck.  Please take prednisone taper as prescribed until all gone.  Continue to take your Robaxin for muscle spasms as prescribed.  If not improving, follow-up with your family doctor for further evaluation and imaging.

## 2017-08-05 NOTE — ED Triage Notes (Signed)
Patient reports that she hads seizure 1 week ago with hx of same and has ongoing right shoulder and back pain, taking mobic with minimal relief

## 2017-08-05 NOTE — ED Notes (Signed)
Approaced pt to given robaxin. Pt stets she took a dose at 9 am of same. Reported to provider. Provider at bedsdie.

## 2017-09-15 DIAGNOSIS — M7551 Bursitis of right shoulder: Secondary | ICD-10-CM | POA: Diagnosis not present

## 2017-09-15 DIAGNOSIS — M12811 Other specific arthropathies, not elsewhere classified, right shoulder: Secondary | ICD-10-CM | POA: Diagnosis not present

## 2017-09-15 DIAGNOSIS — M25519 Pain in unspecified shoulder: Secondary | ICD-10-CM | POA: Diagnosis not present

## 2017-09-15 DIAGNOSIS — D1800 Hemangioma unspecified site: Secondary | ICD-10-CM | POA: Diagnosis not present

## 2017-12-10 IMAGING — CR DG SHOULDER 2+V*R*
2 series · 2 of 2 positions shown · non-contrast
Comparison: Radiograph dated 01/10/2015

CLINICAL DATA: 59-year-old female with seizure and right shoulder
injury.

EXAM:
RIGHT SHOULDER - 2+ VIEW

[x shoulder ap right (1 of 2)]
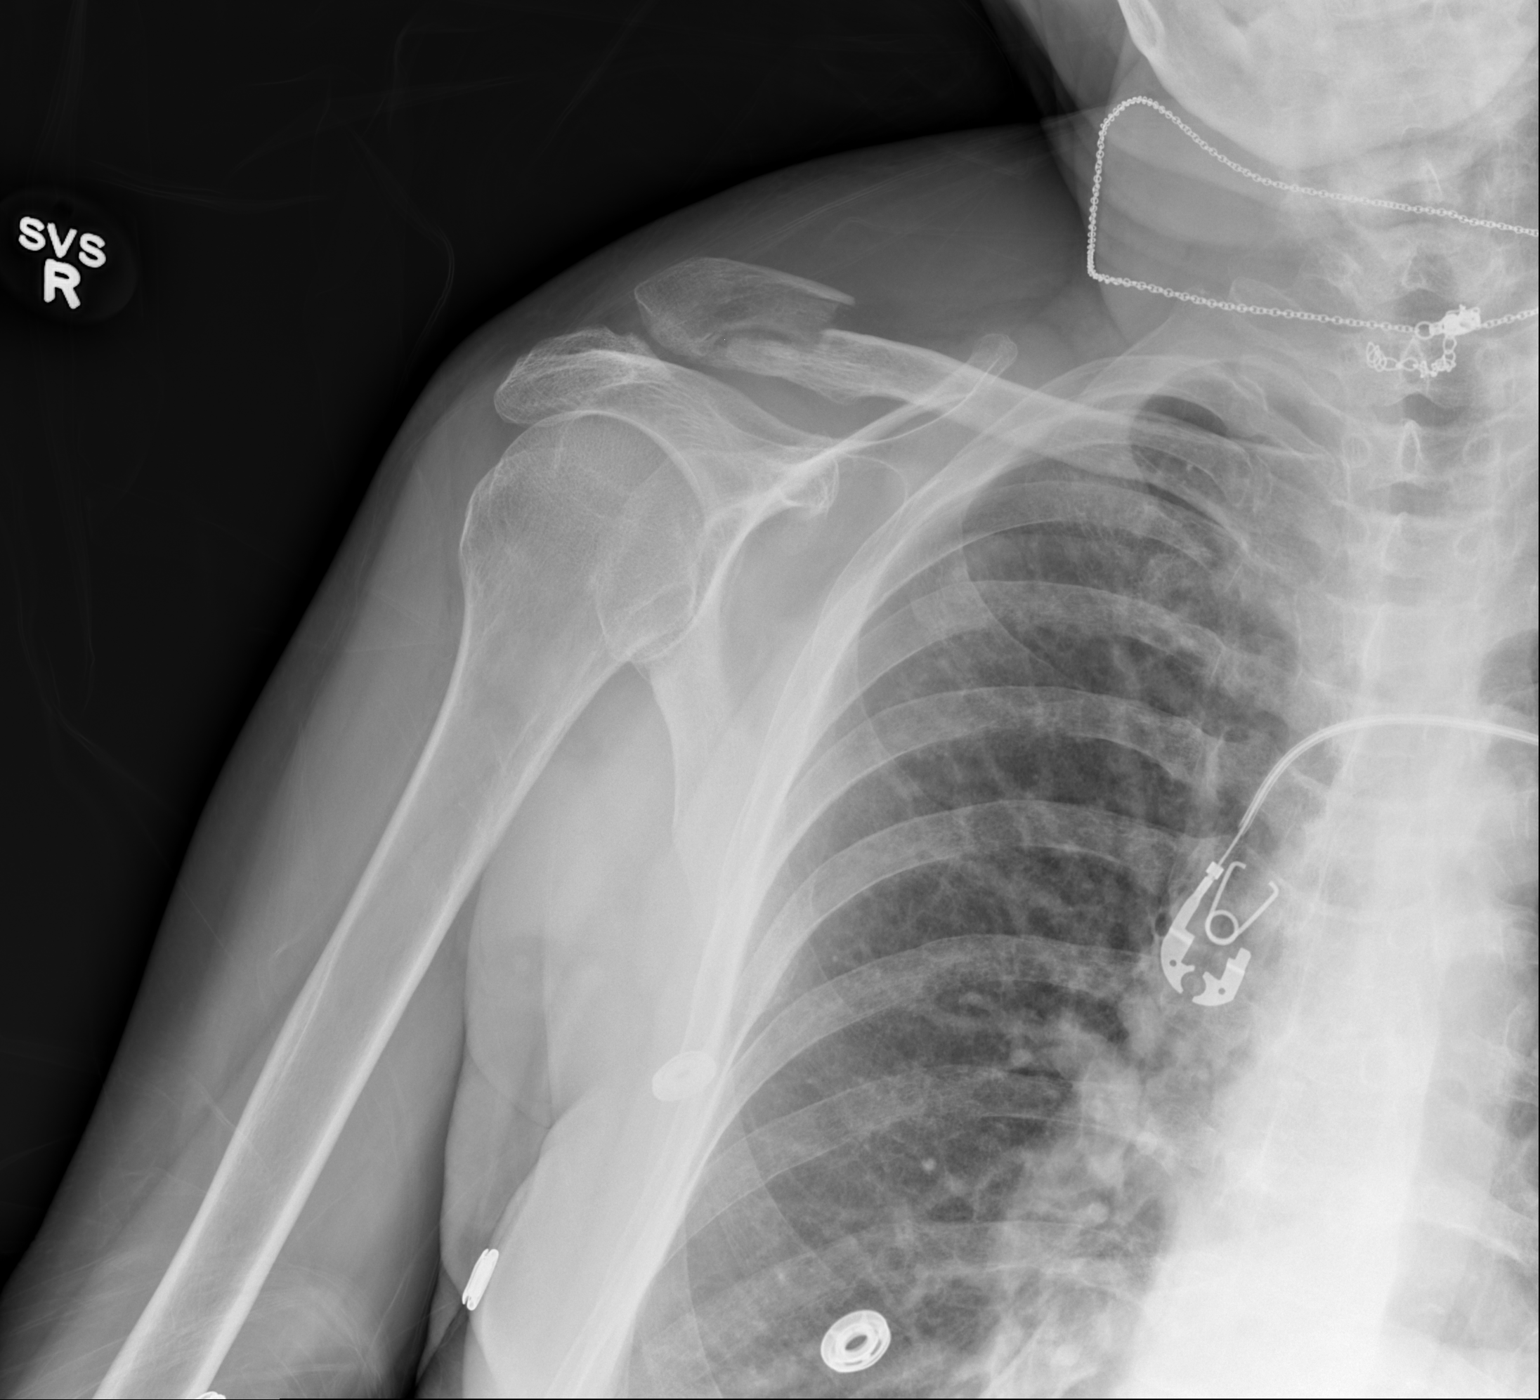

[x shoulder ap right (2 of 2)]
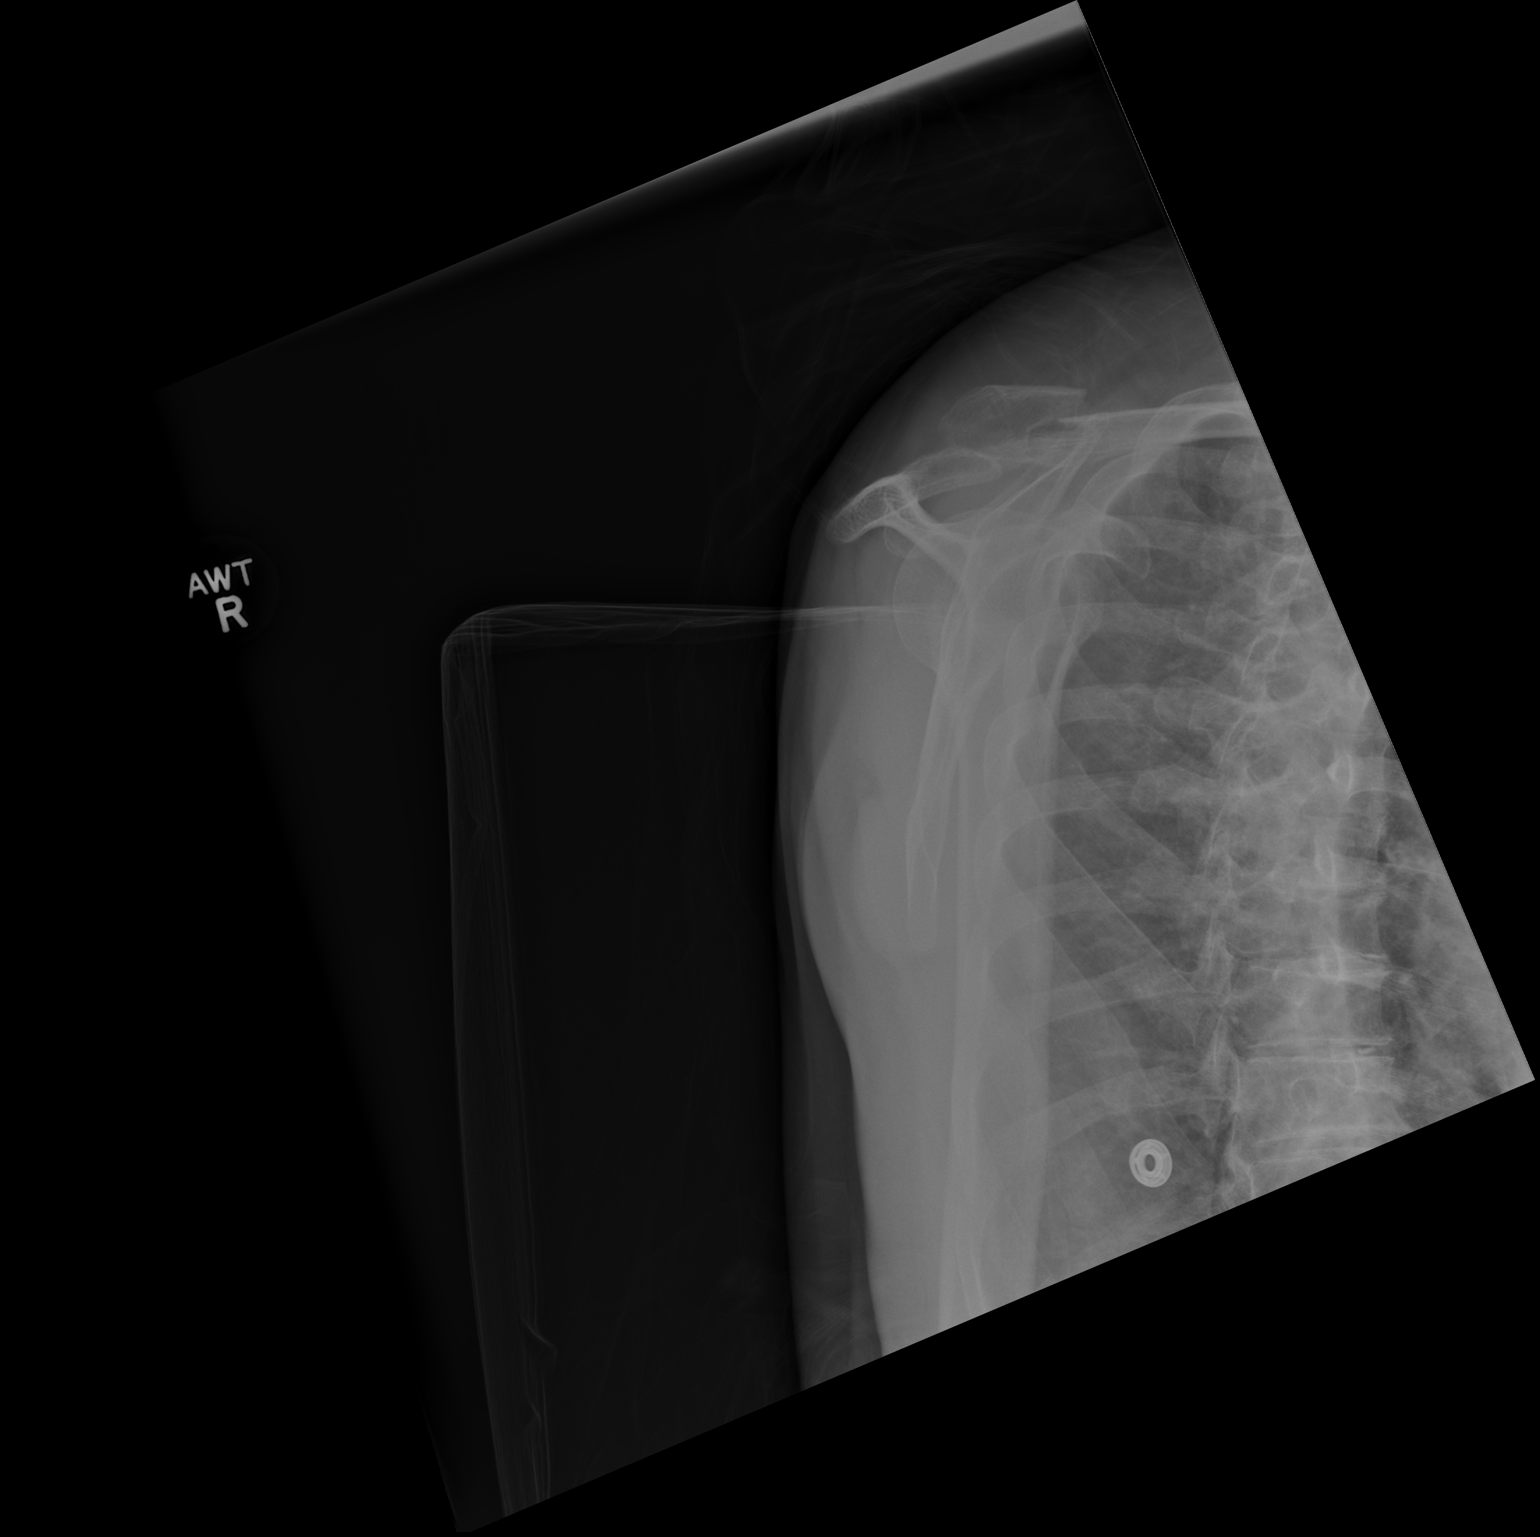

[2 of 2 positions shown; findings below may reference images not displayed]

FINDINGS: There is a displaced fracture of the distal aspect the right
clavicle with approximately 6 mm superior displacement of the distal
fracture fragment. The visualized portion of the humerus appears
intact. No other fracture identified. The glenohumeral joint
alignment is preserved. The soft tissues appear unremarkable.
IMPRESSION: Mildly displaced fracture of the distal aspect of the right
clavicle.

## 2017-12-11 IMAGING — DX DG CHEST 1V PORT
1 series · 2 of 2 positions shown · non-contrast
Comparison: Chest radiograph performed 06/21/2015

CLINICAL DATA: Acute onset of right shoulder pain. Status post
seizure. Cough. Initial encounter.

EXAM:
PORTABLE CHEST 1 VIEW

[Series 1: chest ap · 0.14mm/px · 2 of 2 slices shown]
[im 1/2]
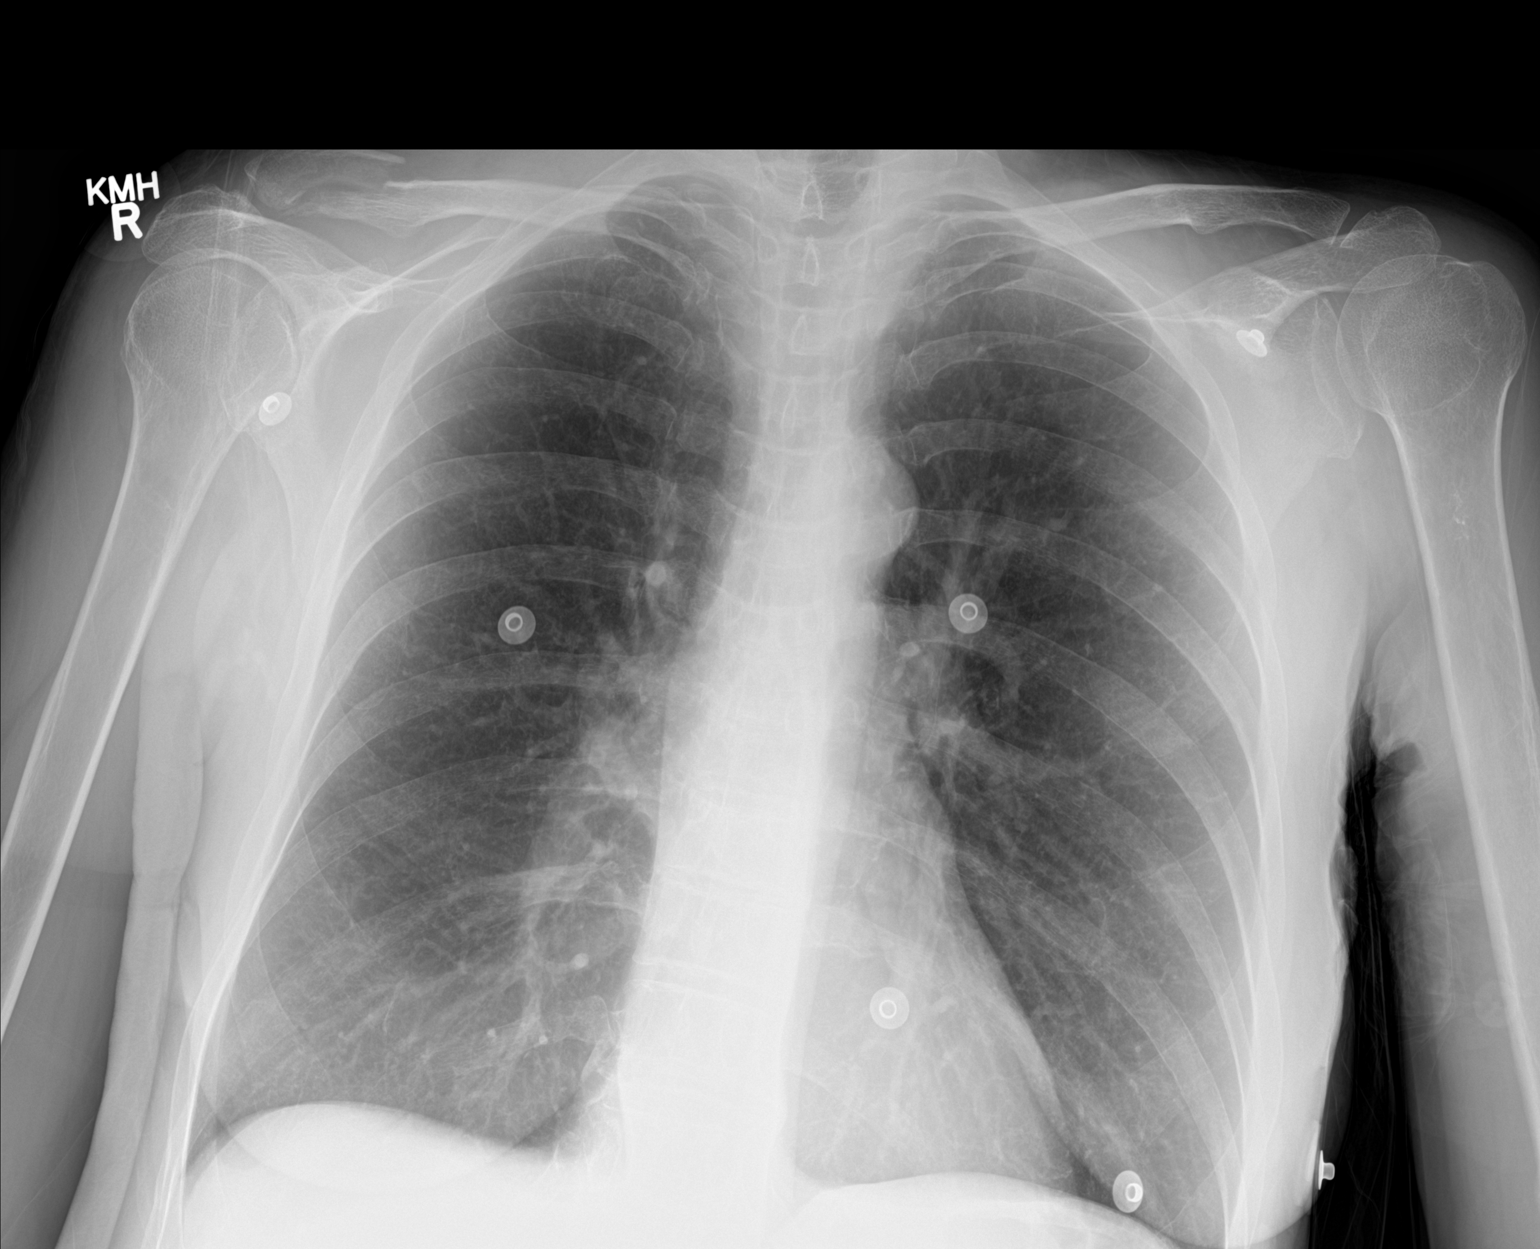
[im 2/2]
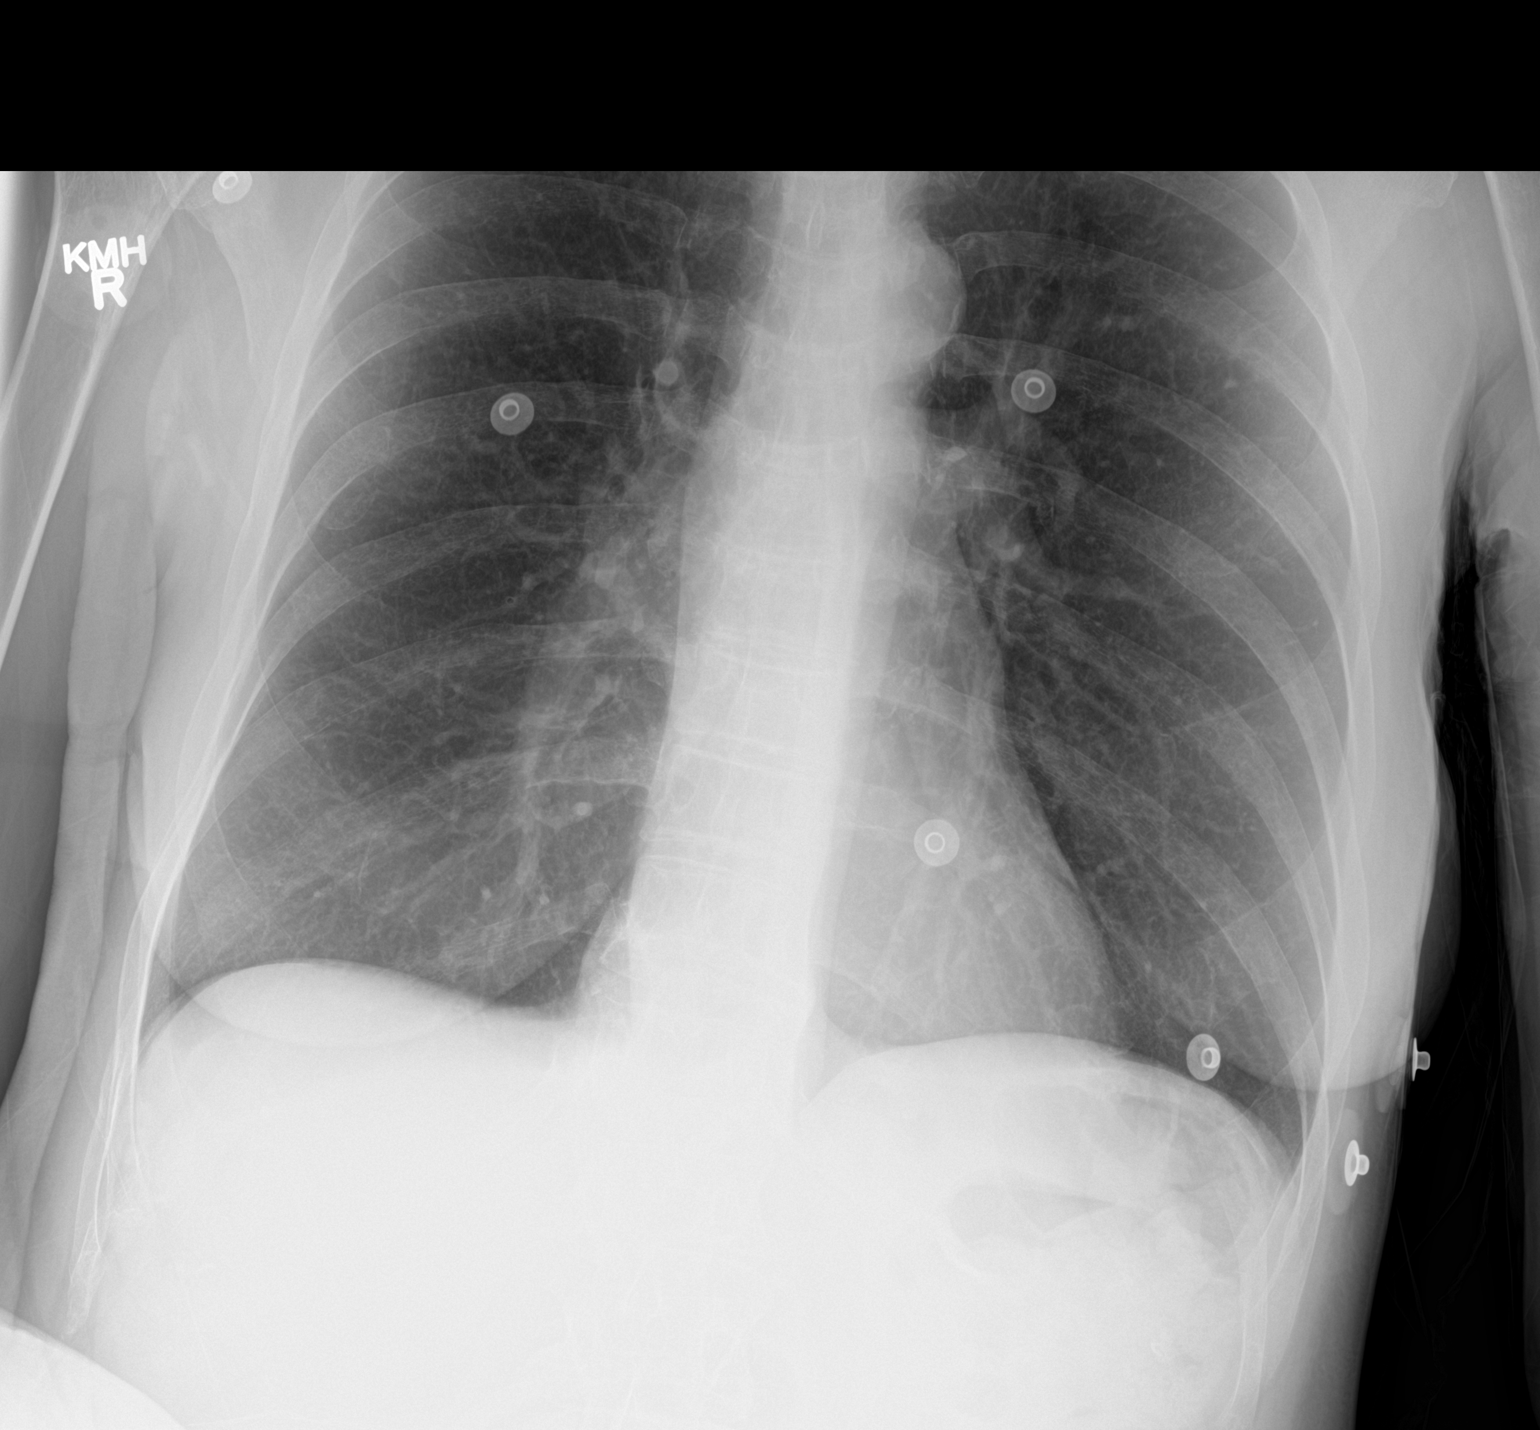

[2 of 2 positions shown; findings below may reference images not displayed]

FINDINGS: There is a mildly displaced oblique fracture through the distal
third of the right clavicle, with mild shortening and superior
displacement at the fracture site.

The lungs are well-aerated and clear. There is no evidence of focal
opacification, pleural effusion or pneumothorax.

The cardiomediastinal silhouette is within normal limits. No acute
osseous abnormalities are seen.
IMPRESSION: 1. Mildly displaced oblique fracture through the distal third of the
right clavicle, with mild shortening and superior displacement at
the fracture site.
2. Lungs remain grossly clear.

## 2017-12-12 IMAGING — CR DG CERVICAL SPINE 2 OR 3 VIEWS
4 series · 4 of 4 positions shown · non-contrast
Comparison: None.

CLINICAL DATA: 59-year-old female with neck pain

EXAM:
CERVICAL SPINE - 2-3 VIEW

[w cervical spine lat (1 of 2)]
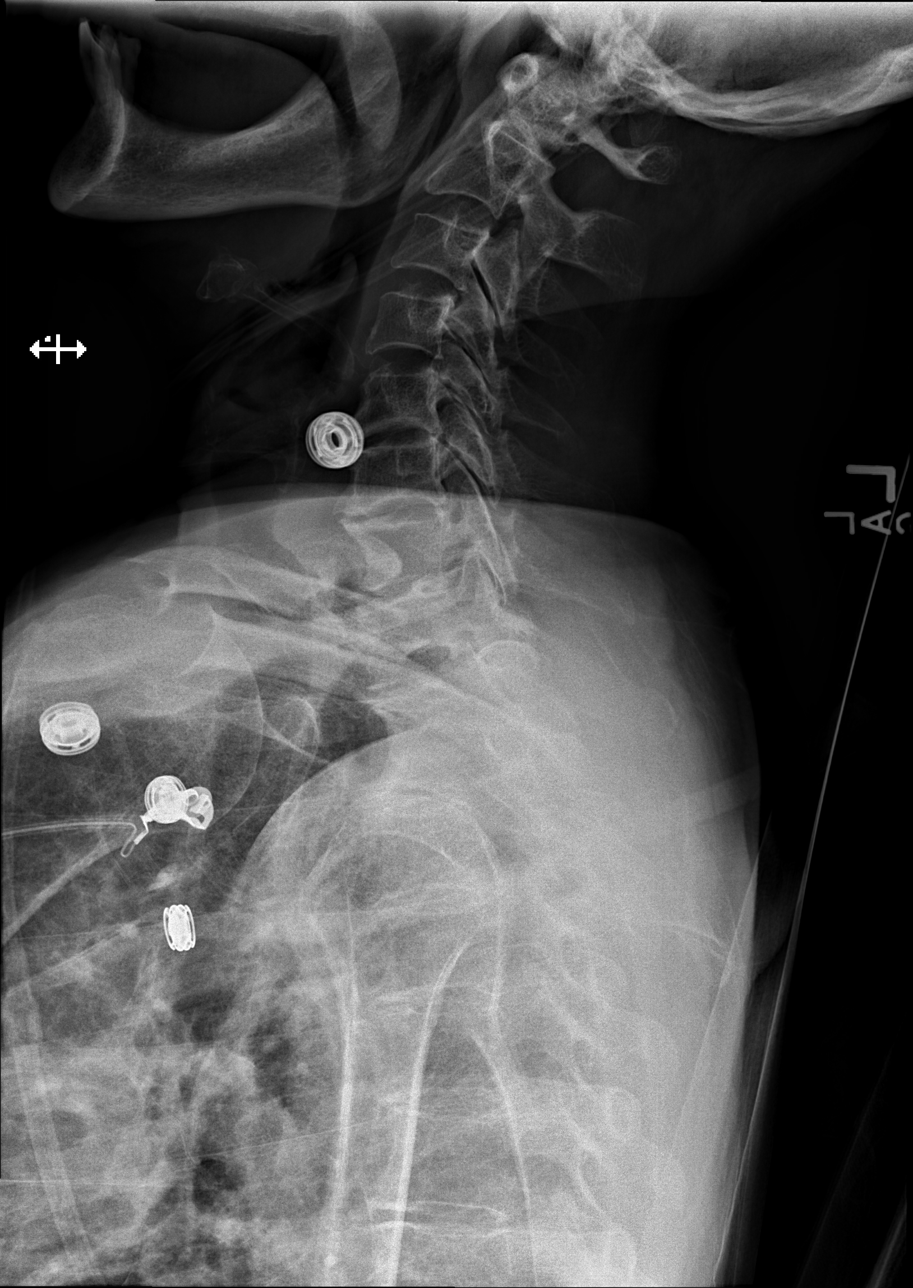

[w cervical spine lat (2 of 2)]
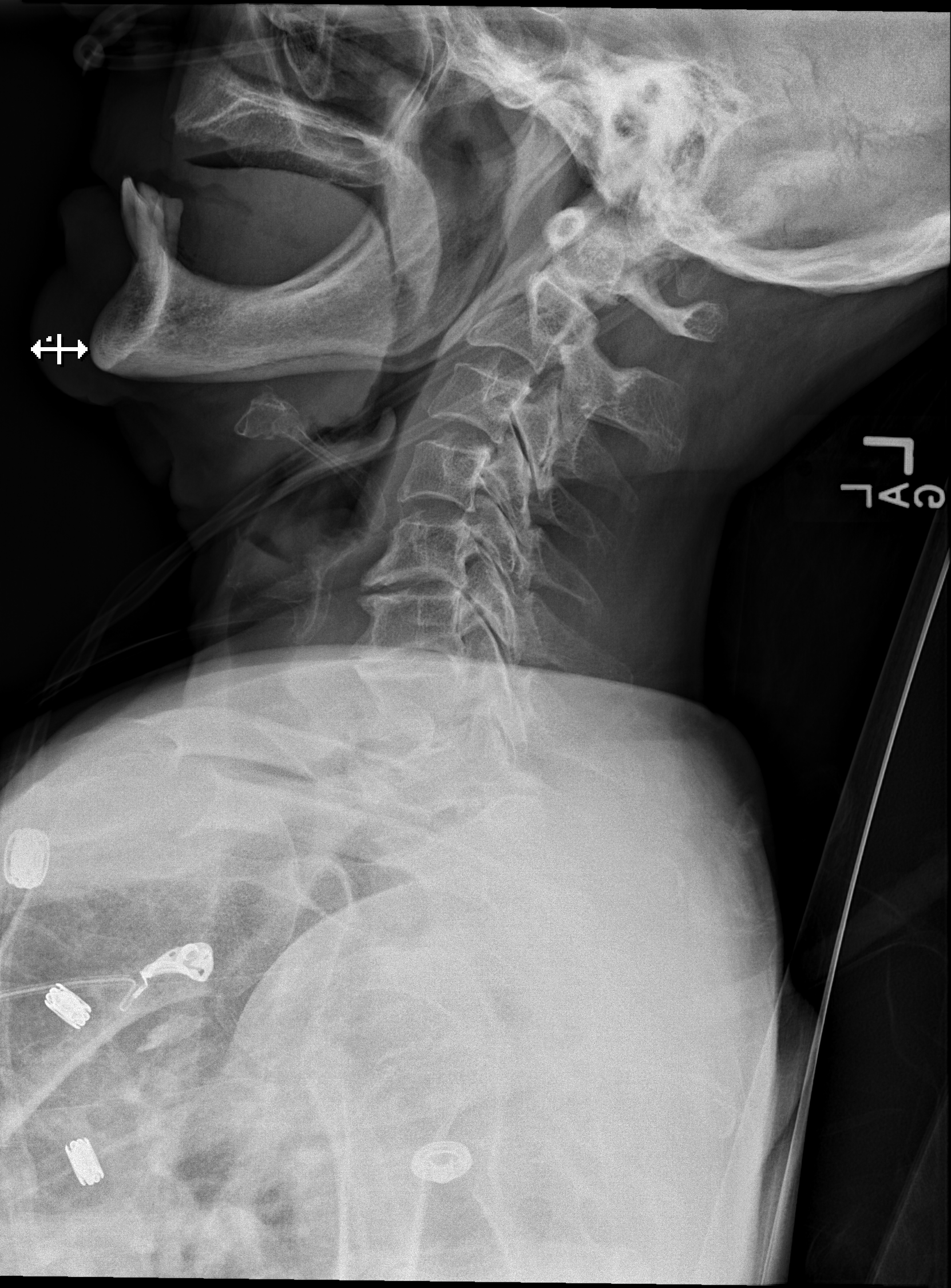

[x cervical spine ap]
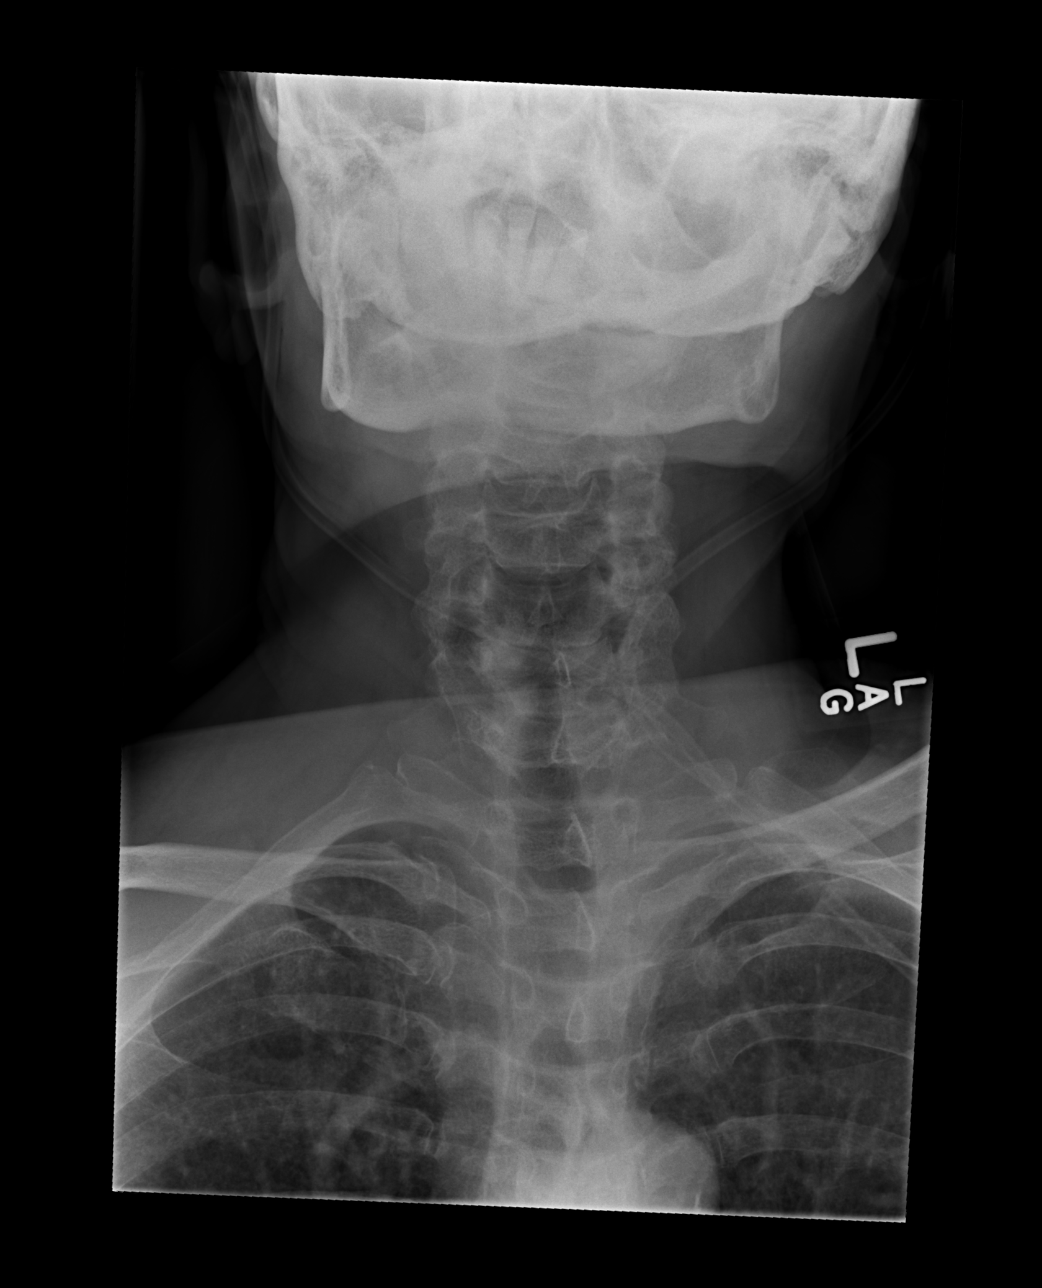

[x cervical spine odontoid]
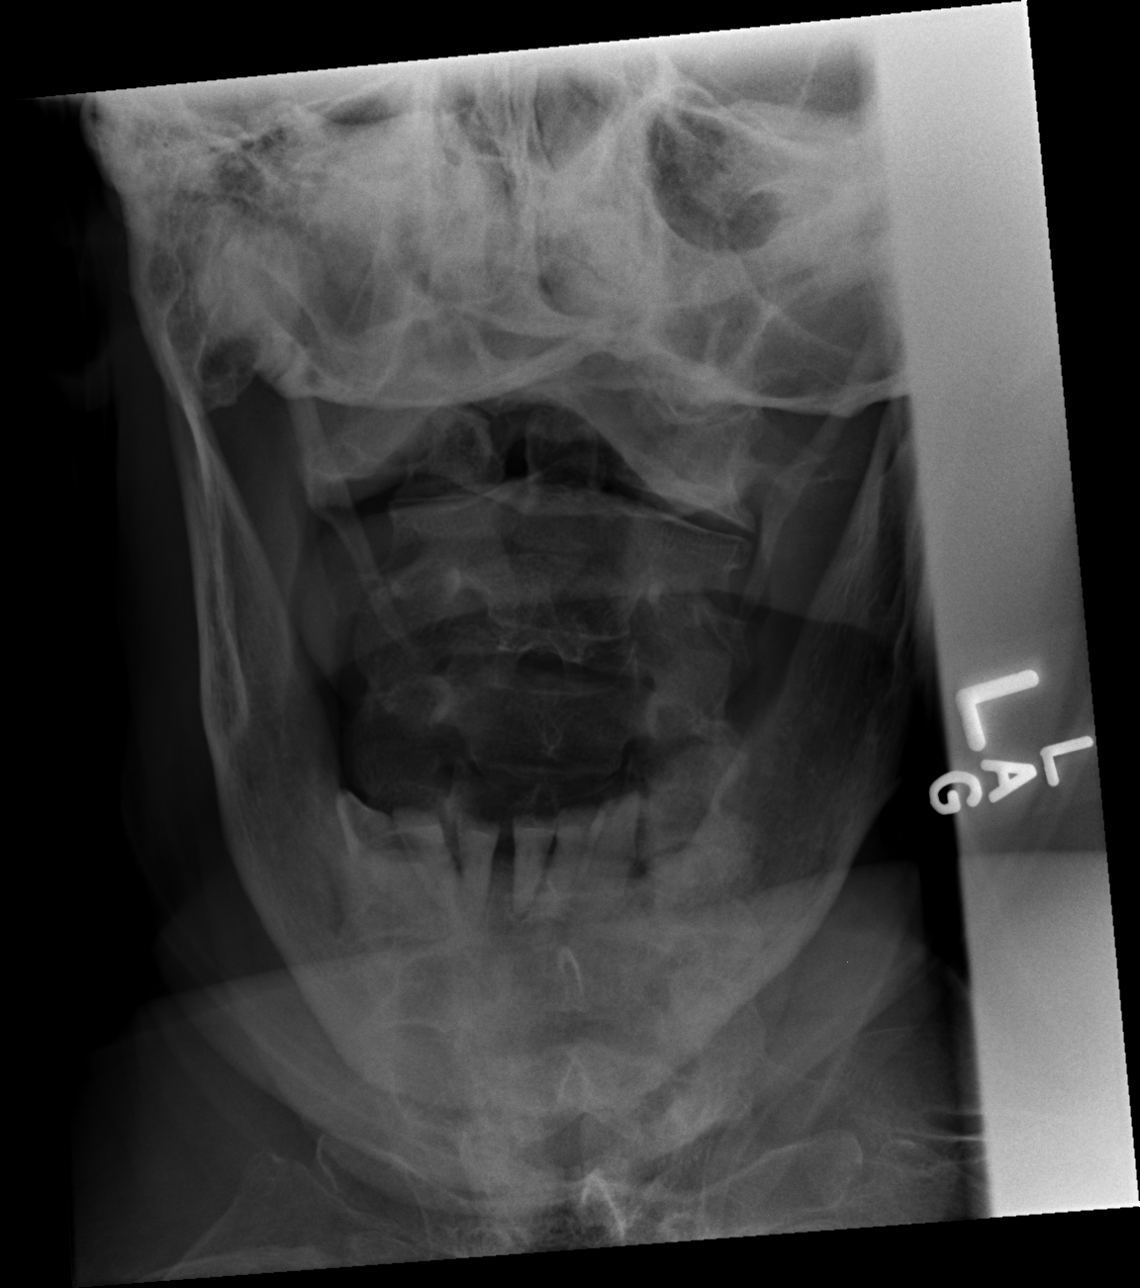

[4 of 4 positions shown; findings below may reference images not displayed]

FINDINGS: There is no acute fracture or subluxation of the cervical spine.
There is degenerative changes most prominent at C5-C6 and C6-C7. The
visualized spinous processes and odontoid appear intact. There is
anatomic alignment of the lateral masses of the C1-C2. The soft
tissues appear unremarkable.
IMPRESSION: No acute fracture or subluxation.

## 2017-12-18 ENCOUNTER — Emergency Department (HOSPITAL_COMMUNITY)
Admission: EM | Admit: 2017-12-18 | Discharge: 2017-12-19 | Disposition: A | Discharge status: Home / Self Care | Payer: Medicare HMO | Attending: Emergency Medicine | Admitting: Emergency Medicine

## 2017-12-18 ENCOUNTER — Encounter (HOSPITAL_COMMUNITY): Payer: Self-pay

## 2017-12-18 DIAGNOSIS — M791 Myalgia, unspecified site: Secondary | ICD-10-CM | POA: Diagnosis not present

## 2017-12-18 DIAGNOSIS — R200 Anesthesia of skin: Secondary | ICD-10-CM | POA: Diagnosis not present

## 2017-12-18 DIAGNOSIS — Z79899 Other long term (current) drug therapy: Secondary | ICD-10-CM | POA: Diagnosis not present

## 2017-12-18 DIAGNOSIS — M545 Low back pain: Secondary | ICD-10-CM | POA: Diagnosis not present

## 2017-12-18 DIAGNOSIS — Z87891 Personal history of nicotine dependence: Secondary | ICD-10-CM | POA: Diagnosis not present

## 2017-12-18 DIAGNOSIS — R51 Headache: Secondary | ICD-10-CM | POA: Insufficient documentation

## 2017-12-18 DIAGNOSIS — R569 Unspecified convulsions: Secondary | ICD-10-CM | POA: Diagnosis not present

## 2017-12-18 DIAGNOSIS — Z8673 Personal history of transient ischemic attack (TIA), and cerebral infarction without residual deficits: Secondary | ICD-10-CM | POA: Diagnosis not present

## 2017-12-18 DIAGNOSIS — R202 Paresthesia of skin: Secondary | ICD-10-CM | POA: Diagnosis not present

## 2017-12-18 DIAGNOSIS — G40909 Epilepsy, unspecified, not intractable, without status epilepticus: Secondary | ICD-10-CM | POA: Diagnosis not present

## 2017-12-18 DIAGNOSIS — J45909 Unspecified asthma, uncomplicated: Secondary | ICD-10-CM | POA: Insufficient documentation

## 2017-12-18 LAB — CBC
HCT: 38.1 % (ref 36.0–46.0)
Hemoglobin: 12.6 g/dL (ref 12.0–15.0)
MCH: 33.2 pg (ref 26.0–34.0)
MCHC: 33.1 g/dL (ref 30.0–36.0)
MCV: 100.5 fL — AB (ref 78.0–100.0)
PLATELETS: 152 10*3/uL (ref 150–400)
RBC: 3.79 MIL/uL — AB (ref 3.87–5.11)
RDW: 12.1 % (ref 11.5–15.5)
WBC: 3.7 10*3/uL — AB (ref 4.0–10.5)

## 2017-12-18 LAB — COMPREHENSIVE METABOLIC PANEL
ALK PHOS: 59 U/L (ref 38–126)
ALT: 12 U/L — ABNORMAL LOW (ref 14–54)
ANION GAP: 9 (ref 5–15)
AST: 18 U/L (ref 15–41)
Albumin: 4.2 g/dL (ref 3.5–5.0)
BILIRUBIN TOTAL: 0.6 mg/dL (ref 0.3–1.2)
BUN: 8 mg/dL (ref 6–20)
CALCIUM: 9.5 mg/dL (ref 8.9–10.3)
CO2: 26 mmol/L (ref 22–32)
CREATININE: 0.84 mg/dL (ref 0.44–1.00)
Chloride: 104 mmol/L (ref 101–111)
GFR calc non Af Amer: >60 mL/min (ref >60)
Glucose, Bld: 93 mg/dL (ref 65–99)
POTASSIUM: 3.6 mmol/L (ref 3.5–5.1)
SODIUM: 139 mmol/L (ref 135–145)
Total Protein: 6.8 g/dL (ref 6.5–8.1)

## 2017-12-18 NOTE — ED Triage Notes (Signed)
Pt states that she had a seizure and fell off the bed the night before las\t (Wednesday). Pt reports she has been having a burning numbness and headache all day yesterday (Tuesday).

## 2017-12-19 ENCOUNTER — Emergency Department (HOSPITAL_COMMUNITY): Payer: Medicare HMO

## 2017-12-19 DIAGNOSIS — M545 Low back pain: Secondary | ICD-10-CM | POA: Diagnosis not present

## 2017-12-19 DIAGNOSIS — R200 Anesthesia of skin: Secondary | ICD-10-CM | POA: Diagnosis not present

## 2017-12-19 DIAGNOSIS — R202 Paresthesia of skin: Secondary | ICD-10-CM | POA: Diagnosis not present

## 2017-12-19 DIAGNOSIS — R51 Headache: Secondary | ICD-10-CM | POA: Diagnosis not present

## 2017-12-19 LAB — PHENYTOIN LEVEL, TOTAL: Phenytoin Lvl: 10.4 ug/mL (ref 10.0–20.0)

## 2017-12-19 MED ORDER — PROCHLORPERAZINE MALEATE 5 MG PO TABS
10.0000 mg | ORAL_TABLET | Freq: Once | ORAL | Status: AC
  Administered 2017-12-19: 10 mg via ORAL
  Filled 2017-12-19: qty 2

## 2017-12-19 MED ORDER — DIPHENHYDRAMINE HCL 25 MG PO CAPS
25.0000 mg | ORAL_CAPSULE | Freq: Once | ORAL | Status: AC
  Administered 2017-12-19: 25 mg via ORAL
  Filled 2017-12-19: qty 1

## 2017-12-19 MED ORDER — PHENYTOIN SODIUM EXTENDED 100 MG PO CAPS
100.0000 mg | ORAL_CAPSULE | Freq: Once | ORAL | Status: AC
  Administered 2017-12-19: 100 mg via ORAL
  Filled 2017-12-19: qty 1

## 2017-12-19 MED ORDER — DEXAMETHASONE 4 MG PO TABS
10.0000 mg | ORAL_TABLET | Freq: Once | ORAL | Status: AC
  Administered 2017-12-19: 10 mg via ORAL
  Filled 2017-12-19: qty 3

## 2017-12-19 MED ORDER — SODIUM CHLORIDE 0.9 % IV BOLUS: 1000.0000 mL | Freq: Once | INTRAVENOUS | Status: DC

## 2017-12-19 NOTE — ED Notes (Signed)
Patient transported to CT 

## 2017-12-19 NOTE — ED Notes (Signed)
Patient transported to MRI 

## 2017-12-19 NOTE — ED Provider Notes (Signed)
Assumed care from Dr. Christy Gentles at 7:31 AM. Briefly, the patient is a 62 y.o. female with PMHx of  has a past medical history of Asthma, Cerebral hemorrhage ([place]) (1989), Chronic back pain, COPD (chronic obstructive pulmonary disease) ([place]), DDD (degenerative disc disease) (06/12/2013), DVT (deep venous thrombosis) ([place]), GERD (gastroesophageal reflux disease), Headache, History of blood transfusion, History of stomach ulcers, Kidney stones, Seizures ([place]), Stroke ([place]) (1990's), and Tunnel vision. here with seizure several days ago, now with ? Numbness L face and L arm. Pt also c/o diffuse body aches, tingling. Legs burning but this is chronic. Slightly unsteady after ambulating. MR ordered.   Labs Reviewed  COMPREHENSIVE METABOLIC PANEL - Abnormal; Notable for the following components:      Result Value   ALT 12 (*)    All other components within normal limits  CBC - Abnormal; Notable for the following components:   WBC 3.7 (*)    RBC 3.79 (*)    MCV 100.5 (*)    All other components within normal limits  PHENYTOIN LEVEL, TOTAL    Course of Care: -Pt sx seem more c/w neuropathy. Awaiting MRI. Of note, she does have h/o L1 fx. Will check plain films given ? Fall from seizure, though no signs of cauda equina or cord compression. -MR negative.  Patient remains neurologically intact with no seizure-like activity.  Suspect possible neuropathy.  Will have her follow-up with her PCP for initiation of further treatment.  Otherwise, lab work and vital signs are stable.  She is at her neurological baseline.  Discharge home.    Duffy Bruce, MD 12/19/17 1410

## 2017-12-19 NOTE — ED Notes (Signed)
ED Provider at bedside. 

## 2017-12-19 NOTE — ED Provider Notes (Signed)
[facility] EMERGENCY DEPARTMENT Provider Note   CSN: [id number] Arrival date & time: 12/18/17  1546   History   Chief Complaint Chief Complaint  Patient presents with  . Seizures    HPI [name] is a 62 y.o. female.  The history is provided by the patient.  Seizures   This is a recurrent problem. The problem has been gradually improving. Associated symptoms include headaches. Pertinent negatives include no chest pain and no vomiting.  Patient presents for multiple complaints She reports the previous night she had a seizure and fell out of bed.  She has a long history of seizures and last seizure was about a month ago Patient also reports burning starts in her feet that goes throughout her body.  She also reports that her feet are cramping up. She reports persistent pain in her head after the fall.  She also reports dizziness upon standing. Reports she takes Vimpat and Dilantin for seizures.  Past Medical History:  Diagnosis Date  . Asthma   . Cerebral hemorrhage ([place]) 1989  . Chronic back pain   . COPD (chronic obstructive pulmonary disease) ([place])   . DDD (degenerative disc disease) 06/12/2013  . DVT (deep venous thrombosis) ([place])    "she's had several since 1990"  . GERD (gastroesophageal reflux disease)   . Headache    "usually around time when she's had a seizure"  . History of blood transfusion    "when she was a baby"  . History of stomach ulcers   . Kidney stones   . Seizures ([place])    "because of her brain surgery"  . Stroke [facility]) [phone]   family denies residual on 11/09/2014  . Tunnel vision    "since brain OR"    Patient Active Problem List   Diagnosis Date Noted  . Atypical chest pain 02/17/2017  . Localized swelling of lower extremity 02/17/2017  . GERD (gastroesophageal reflux disease) 02/17/2017  . Costochondritis 02/17/2017  . Seizures ([place]) 11/02/2015  . Gait disturbance 11/02/2015  . Right clavicle fracture 11/02/2015  .  Infection of urinary tract 11/02/2015  . Chest pain 03/31/2015  . Acute on chronic respiratory failure with hypoxia ([place]) 03/31/2015  . SOB (shortness of breath) 03/31/2015  . Anxiety state 03/31/2015  . Malnutrition of moderate degree ([place]) 03/31/2015  . COPD exacerbation ([place]) 03/30/2015  . Hypoxia 01/18/2015  . Chronic obstructive pulmonary disease with acute exacerbation ([place])   . Fever 01/17/2015  . Cough 01/17/2015  . Sepsis ([place]) 01/17/2015  . COPD (chronic obstructive pulmonary disease) ([place])   . Unintentional weight loss 01/11/2015  . Asthma 05/15/2014  . Seizure ([place]) 05/15/2014  . Protein calorie malnutrition ([place]) 05/15/2014  . DVT of leg (deep venous thrombosis) ([place]) 06/19/2013  . DDD (degenerative disc disease) 06/12/2013  . Paresthesias 06/12/2013  . Tremor 06/09/2013  . Thrombocytopenia- chronic 06/29/2012  . Noncompliance with medication treatment due to underuse of medication 06/29/2012  . Stress-personal 06/29/2012  . Recurrent seizures ([place]) 06/29/2012  . Tobacco dependence 06/28/2012  . Skull fracture ([place]) 02/28/2012  . Dilantin toxicity 02/28/2012  . Seizure disorder ([place]) 02/28/2012  . History of Intracranial bleed 02/28/2012    Past Surgical History:  Procedure Laterality Date  . Aneta   craniotomy with hematoma evacuation  . CESAREAN SECTION  1979; 1987; 1989  . HEMORRHOID SURGERY    . TUBAL LIGATION  1990's     OB History   None  Home Medications    Prior to Admission medications   Medication Sig Start Date End Date Taking? Authorizing Provider  lacosamide (VIMPAT) 200 MG TABS tablet Take 200 mg by mouth 2 (two) times daily.   Yes [provider]  phenytoin (DILANTIN) 100 MG ER capsule Take 2 capsules (200 mg total) by mouth at bedtime. Patient taking differently: Take 100 mg by mouth 2 (two) times daily.  02/19/17  Yes Johnson, Clanford L, MD  phenytoin (DILANTIN) 30 MG ER capsule Take 60 mg by mouth daily.  Take with Dilantin 100 mg to equal 160 mg 09/10/17  Yes [provider]  pantoprazole (PROTONIX) 40 MG tablet Take 1 tablet (40 mg total) by mouth daily. Patient not taking: Reported on 12/19/2017 02/18/17   Irwin Brakeman L, MD  predniSONE (STERAPRED UNI-PAK 21 TAB) 10 MG (21) TBPK tablet Take by mouth daily. Take 6 tabs by mouth daily  for 2 days, then 5 tabs for 2 days, then 4 tabs for 2 days, then 3 tabs for 2 days, 2 tabs for 2 days, then 1 tab by mouth daily for 2 days Patient not taking: Reported on 12/19/2017 08/05/17   Jeannett Senior, PA-C  vitamin B-12 (CYANOCOBALAMIN) 1000 MCG tablet Take 1 tablet (1,000 mcg total) by mouth daily. Patient not taking: Reported on 12/19/2017 02/18/17   Murlean Iba, MD    Family History Family History  Problem Relation Age of Onset  . Heart attack Mother        4 MIs, started in her 56s, also vaginal cancer and colon cancer  . Cancer Mother   . Heart attack Father        Died suddenly age 79 - autopsy revealed heart attack her patient  . Cancer Brother        Sinus cancer    Social History Social History   Tobacco Use  . Smoking status: Former Smoker    Packs/day: 0.50    Years: 20.00    Pack years: 10.00    Types: Cigarettes    Last attempt to quit: 10/26/2017    Years since quitting: 0.1  . Smokeless tobacco: Never Used  . Tobacco comment: Previously smoked 2 ppd, down to 4 cigs/day  Substance Use Topics  . Alcohol use: No  . Drug use: No     Allergies   Zonisamide; Codeine; Keppra [levetiracetam]; Pregabalin; and Olive oil   Review of Systems Review of Systems  Constitutional: Negative for fever.  Cardiovascular: Negative for chest pain.  Gastrointestinal: Negative for vomiting.  Musculoskeletal: Positive for arthralgias.  Neurological: Positive for seizures and headaches.  All other systems reviewed and are negative.    Physical Exam Updated Vital Signs BP 113/65 (BP Location: Right Arm)   Pulse 73    Temp 98.3 F (36.8 C) (Oral)   Resp 18   Ht 1.702 m (5\' 7" )   Wt 56.7 kg (125 lb)   SpO2 95%   BMI 19.58 kg/m   Physical Exam CONSTITUTIONAL: chronically ill appearing HEAD: tenderness to palpation of posterior scalp EYES: EOMI/PERRL ENMT: Mucous membranes moist NECK: supple no meningeal signs SPINE/BACK:entire spine nontender, No bruising/crepitance/stepoffs noted to spine CV: S1/S2 noted, no murmurs/rubs/gallops noted LUNGS: Lungs are clear to auscultation bilaterally, no apparent distress ABDOMEN: soft, nontender GU:no cva tenderness NEURO: Pt is awake/alert/appropriate, moves all extremitiesx4.  No facial droop.  No arm or leg drift EXTREMITIES: pulses normal/equal, full ROM, no LE edema or calf tenderness All other extremities/joints palpated/ranged and nontender  SKIN: warm, color normal PSYCH: no abnormalities of mood noted, alert and oriented to situation   ED Treatments / Results  Labs (all labs ordered are listed, but only abnormal results are displayed) Labs Reviewed  COMPREHENSIVE METABOLIC PANEL - Abnormal; Notable for the following components:      Result Value   ALT 12 (*)    All other components within normal limits  CBC - Abnormal; Notable for the following components:   WBC 3.7 (*)    RBC 3.79 (*)    MCV 100.5 (*)    All other components within normal limits  PHENYTOIN LEVEL, TOTAL    EKG EKG Interpretation  Date/Time:  Saturday Dec 19 2017 06:32:39 EDT Ventricular Rate:  75 PR Interval:    QRS Duration: 91 QT Interval:  404 QTC Calculation: 452 R Axis:   89 Text Interpretation:  Sinus rhythm Baseline wander in lead(s) V3 Interpretation limited secondary to artifact No significant change since last tracing Confirmed by Ripley Fraise [phone]) on 12/19/2017 6:44:28 AM   Radiology Ct Head Wo Contrast  Result Date: 12/19/2017 CLINICAL DATA:  Initial evaluation for headache, recent fall. EXAM: CT HEAD WITHOUT CONTRAST TECHNIQUE: Contiguous  axial images were obtained from the base of the skull through the vertex without intravenous contrast. COMPARISON:  Prior study from 11/09/2014. FINDINGS: Brain: Sequelae of prior right parietal craniotomy with extensive encephalomalacia within the underlying right parieto-occipital region, stable. No acute intracranial hemorrhage. No acute large vessel territory infarct. No mass lesion, midline shift or mass effect. Ex vacuo dilatation of the right lateral ventricle without hydrocephalus. No extra-axial fluid collection. Vascular: No hyperdense vessel. Calcified atherosclerosis at the skull base. Skull: Scalp soft tissues demonstrate no acute abnormality. Remote craniotomy at the right parietal calvarium. Sinuses/Orbits: Globes and orbital soft tissues within normal limits. Visualized paranasal sinuses are clear. Chronic left mastoid effusion. Other: None. IMPRESSION: 1. No acute intracranial abnormality. 2. Sequelae of prior right parietal craniotomy with extensive encephalomalacia within the underlying right parieto-occipital region, stable. Electronically Signed   By: Jeannine Boga M.D.   On: 12/19/2017 06:01    Procedures Procedures    Medications Ordered in ED Medications - No data to display   Initial Impression / Assessment and Plan / ED Course  I have reviewed the triage vital signs and the nursing notes.  Pertinent labs  results that were available during my care of the patient were reviewed by me and considered in my medical decision making (see chart for details).     5:58 AM Patient here for multiple complaints.  She reports she had a breakthrough seizure about a day ago hitting her head.  No other evidence of trauma.  She also reports cramping and burning that starts in her feet and goes that her body.  All pulses are intact.   However she does appear lightheaded upon standing.  Will need to check her Dilantin level.  Due to persistent headache after fall, will get CT head as  well 7:23 AM Ct head negative Labs reassuring ekg unchanged Patient now reports she has left facial numbness and left arm numbness that is new over the past several days.  She initially only mentioned that she had burning in her legs that went up to her whole body  when standing she appears a bit unsteady. D/w dr isaac at signout to f/u on MRI brain If negative she can be discharged  Final Clinical Impressions(s) / ED Diagnoses   Final diagnoses:  Seizure ([place])  Myalgia  Paresthesia    ED Discharge Orders    None       Ripley Fraise, MD 12/19/17 [phone]

## 2017-12-19 NOTE — ED Notes (Signed)
This RN unsuccessful with IV attempt will attempt again when pt returns.

## 2017-12-19 NOTE — ED Notes (Signed)
Nurse will draw labs. 

## 2017-12-19 NOTE — ED Notes (Signed)
Recollect dark green top.

## 2017-12-19 NOTE — Discharge Instructions (Addendum)
Follow-up with Dr. Maudie Mercury to discuss starting something like Gabapentin for your paresthesias/neuropathy pain  Keep taking your seizure meds as prescribed

## 2017-12-19 NOTE — ED Notes (Signed)
Pt returned from MRI °

## 2018-03-04 DIAGNOSIS — R42 Dizziness and giddiness: Secondary | ICD-10-CM | POA: Diagnosis not present

## 2018-03-04 DIAGNOSIS — R0789 Other chest pain: Secondary | ICD-10-CM | POA: Diagnosis not present

## 2018-03-04 DIAGNOSIS — Z889 Allergy status to unspecified drugs, medicaments and biological substances status: Secondary | ICD-10-CM | POA: Diagnosis not present

## 2018-03-04 DIAGNOSIS — R079 Chest pain, unspecified: Secondary | ICD-10-CM | POA: Diagnosis not present

## 2018-03-04 DIAGNOSIS — Z79899 Other long term (current) drug therapy: Secondary | ICD-10-CM | POA: Diagnosis not present

## 2018-03-04 DIAGNOSIS — Z87891 Personal history of nicotine dependence: Secondary | ICD-10-CM | POA: Diagnosis not present

## 2018-03-19 ENCOUNTER — Inpatient Hospital Stay (HOSPITAL_COMMUNITY)
Admission: EM | Admit: 2018-03-19 | Discharge: 2018-03-25 | DRG: 093 | Disposition: A | Discharge status: Home Health | Payer: Medicare HMO | Attending: Internal Medicine | Admitting: Internal Medicine

## 2018-03-19 ENCOUNTER — Emergency Department (HOSPITAL_COMMUNITY): Payer: Medicare HMO

## 2018-03-19 ENCOUNTER — Other Ambulatory Visit: Payer: Self-pay

## 2018-03-19 ENCOUNTER — Encounter (HOSPITAL_COMMUNITY): Payer: Self-pay | Admitting: Internal Medicine

## 2018-03-19 DIAGNOSIS — R51 Headache: Secondary | ICD-10-CM | POA: Diagnosis not present

## 2018-03-19 DIAGNOSIS — M79609 Pain in unspecified limb: Secondary | ICD-10-CM | POA: Diagnosis not present

## 2018-03-19 DIAGNOSIS — M545 Low back pain: Secondary | ICD-10-CM | POA: Diagnosis present

## 2018-03-19 DIAGNOSIS — G8929 Other chronic pain: Secondary | ICD-10-CM | POA: Diagnosis present

## 2018-03-19 DIAGNOSIS — I959 Hypotension, unspecified: Secondary | ICD-10-CM | POA: Diagnosis not present

## 2018-03-19 DIAGNOSIS — Z8673 Personal history of transient ischemic attack (TIA), and cerebral infarction without residual deficits: Secondary | ICD-10-CM | POA: Diagnosis not present

## 2018-03-19 DIAGNOSIS — J189 Pneumonia, unspecified organism: Secondary | ICD-10-CM | POA: Diagnosis not present

## 2018-03-19 DIAGNOSIS — Z720 Tobacco use: Secondary | ICD-10-CM

## 2018-03-19 DIAGNOSIS — Z79899 Other long term (current) drug therapy: Secondary | ICD-10-CM

## 2018-03-19 DIAGNOSIS — R531 Weakness: Secondary | ICD-10-CM | POA: Diagnosis not present

## 2018-03-19 DIAGNOSIS — G40909 Epilepsy, unspecified, not intractable, without status epilepticus: Secondary | ICD-10-CM

## 2018-03-19 DIAGNOSIS — Z87891 Personal history of nicotine dependence: Secondary | ICD-10-CM | POA: Diagnosis not present

## 2018-03-19 DIAGNOSIS — G40901 Epilepsy, unspecified, not intractable, with status epilepticus: Secondary | ICD-10-CM | POA: Diagnosis not present

## 2018-03-19 DIAGNOSIS — G4489 Other headache syndrome: Secondary | ICD-10-CM | POA: Diagnosis not present

## 2018-03-19 DIAGNOSIS — M4802 Spinal stenosis, cervical region: Secondary | ICD-10-CM | POA: Diagnosis not present

## 2018-03-19 DIAGNOSIS — T420X1A Poisoning by hydantoin derivatives, accidental (unintentional), initial encounter: Secondary | ICD-10-CM | POA: Diagnosis present

## 2018-03-19 DIAGNOSIS — R569 Unspecified convulsions: Secondary | ICD-10-CM | POA: Diagnosis not present

## 2018-03-19 DIAGNOSIS — M48061 Spinal stenosis, lumbar region without neurogenic claudication: Secondary | ICD-10-CM | POA: Diagnosis present

## 2018-03-19 DIAGNOSIS — J449 Chronic obstructive pulmonary disease, unspecified: Secondary | ICD-10-CM | POA: Diagnosis present

## 2018-03-19 DIAGNOSIS — I629 Nontraumatic intracranial hemorrhage, unspecified: Secondary | ICD-10-CM

## 2018-03-19 DIAGNOSIS — K219 Gastro-esophageal reflux disease without esophagitis: Secondary | ICD-10-CM | POA: Diagnosis present

## 2018-03-19 DIAGNOSIS — T420X5A Adverse effect of hydantoin derivatives, initial encounter: Secondary | ICD-10-CM | POA: Diagnosis not present

## 2018-03-19 DIAGNOSIS — Z86718 Personal history of other venous thrombosis and embolism: Secondary | ICD-10-CM | POA: Diagnosis not present

## 2018-03-19 DIAGNOSIS — T420X4A Poisoning by hydantoin derivatives, undetermined, initial encounter: Secondary | ICD-10-CM | POA: Diagnosis not present

## 2018-03-19 DIAGNOSIS — R200 Anesthesia of skin: Secondary | ICD-10-CM | POA: Diagnosis present

## 2018-03-19 DIAGNOSIS — R627 Adult failure to thrive: Secondary | ICD-10-CM | POA: Diagnosis not present

## 2018-03-19 DIAGNOSIS — R270 Ataxia, unspecified: Principal | ICD-10-CM | POA: Diagnosis present

## 2018-03-19 DIAGNOSIS — I951 Orthostatic hypotension: Secondary | ICD-10-CM

## 2018-03-19 DIAGNOSIS — M47816 Spondylosis without myelopathy or radiculopathy, lumbar region: Secondary | ICD-10-CM | POA: Diagnosis not present

## 2018-03-19 DIAGNOSIS — M5126 Other intervertebral disc displacement, lumbar region: Secondary | ICD-10-CM | POA: Diagnosis not present

## 2018-03-19 DIAGNOSIS — M50223 Other cervical disc displacement at C6-C7 level: Secondary | ICD-10-CM | POA: Diagnosis not present

## 2018-03-19 DIAGNOSIS — D696 Thrombocytopenia, unspecified: Secondary | ICD-10-CM | POA: Diagnosis present

## 2018-03-19 DIAGNOSIS — J439 Emphysema, unspecified: Secondary | ICD-10-CM | POA: Diagnosis not present

## 2018-03-19 DIAGNOSIS — J45909 Unspecified asthma, uncomplicated: Secondary | ICD-10-CM | POA: Diagnosis not present

## 2018-03-19 DIAGNOSIS — T420X2A Poisoning by hydantoin derivatives, intentional self-harm, initial encounter: Secondary | ICD-10-CM | POA: Diagnosis not present

## 2018-03-19 LAB — CBC
HEMATOCRIT: 37.4 % (ref 36.0–46.0)
Hemoglobin: 11.9 g/dL — ABNORMAL LOW (ref 12.0–15.0)
MCH: 32.9 pg (ref 26.0–34.0)
MCHC: 31.8 g/dL (ref 30.0–36.0)
MCV: 103.3 fL — AB (ref 78.0–100.0)
PLATELETS: 135 10*3/uL — AB (ref 150–400)
RBC: 3.62 MIL/uL — AB (ref 3.87–5.11)
RDW: 12.4 % (ref 11.5–15.5)
WBC: 3.5 10*3/uL — AB (ref 4.0–10.5)

## 2018-03-19 LAB — URINALYSIS, ROUTINE W REFLEX MICROSCOPIC
Bilirubin Urine: NEGATIVE
GLUCOSE, UA: NEGATIVE mg/dL
HGB URINE DIPSTICK: NEGATIVE
Ketones, ur: NEGATIVE mg/dL
LEUKOCYTES UA: NEGATIVE
Nitrite: NEGATIVE
PH: 7 (ref 5.0–8.0)
PROTEIN: NEGATIVE mg/dL
SPECIFIC GRAVITY, URINE: 1.004 — AB (ref 1.005–1.030)

## 2018-03-19 LAB — BASIC METABOLIC PANEL
Anion gap: 7 (ref 5–15)
BUN: 5 mg/dL — ABNORMAL LOW (ref 6–20)
CHLORIDE: 109 mmol/L (ref 98–111)
CO2: 26 mmol/L (ref 22–32)
CREATININE: 0.65 mg/dL (ref 0.44–1.00)
Calcium: 8.8 mg/dL — ABNORMAL LOW (ref 8.9–10.3)
GFR calc non Af Amer: >60 mL/min (ref >60)
Glucose, Bld: 88 mg/dL (ref 70–99)
POTASSIUM: 3.6 mmol/L (ref 3.5–5.1)
Sodium: 142 mmol/L (ref 135–145)

## 2018-03-19 LAB — CBG MONITORING, ED: Glucose-Capillary: 87 mg/dL (ref 70–99)

## 2018-03-19 LAB — PHENYTOIN LEVEL, TOTAL: Phenytoin Lvl: 33.8 ug/mL (ref 10.0–20.0)

## 2018-03-19 MED ORDER — ALBUTEROL SULFATE (2.5 MG/3ML) 0.083% IN NEBU: 2.5000 mg | INHALATION_SOLUTION | Freq: Four times a day (QID) | RESPIRATORY_TRACT | Status: DC | PRN

## 2018-03-19 MED ORDER — STROKE: EARLY STAGES OF RECOVERY BOOK
Freq: Once | Status: AC
  Administered 2018-03-19

## 2018-03-19 MED ORDER — ACETAMINOPHEN 160 MG/5ML PO SOLN
650.0000 mg | ORAL | Status: DC | PRN
Start: 2018-03-19 — End: 2018-03-25

## 2018-03-19 MED ORDER — ACETAMINOPHEN 325 MG PO TABS
650.0000 mg | ORAL_TABLET | ORAL | Status: DC | PRN
  Administered 2018-03-20 (×2): 650 mg via ORAL
  Filled 2018-03-19 (×2): qty 2

## 2018-03-19 MED ORDER — LACOSAMIDE 200 MG PO TABS
200.0000 mg | ORAL_TABLET | Freq: Two times a day (BID) | ORAL | Status: DC
  Administered 2018-03-19 – 2018-03-25 (×12): 200 mg via ORAL
  Filled 2018-03-19 (×12): qty 1

## 2018-03-19 MED ORDER — SODIUM CHLORIDE 0.9 % IV SOLN
INTRAVENOUS | Status: AC
  Administered 2018-03-19 – 2018-03-20 (×2): via INTRAVENOUS

## 2018-03-19 MED ORDER — ACETAMINOPHEN 650 MG RE SUPP: 650.0000 mg | RECTAL | Status: DC | PRN

## 2018-03-19 MED ORDER — KETOROLAC TROMETHAMINE 15 MG/ML IJ SOLN
15.0000 mg | Freq: Once | INTRAMUSCULAR | Status: AC
  Administered 2018-03-19: 15 mg via INTRAVENOUS
  Filled 2018-03-19: qty 1

## 2018-03-19 MED ORDER — MORPHINE SULFATE (PF) 2 MG/ML IV SOLN
1.0000 mg | Freq: Once | INTRAVENOUS | Status: AC
  Administered 2018-03-19: 1 mg via INTRAVENOUS
  Filled 2018-03-19: qty 1

## 2018-03-19 NOTE — ED Provider Notes (Signed)
[facility] EMERGENCY DEPARTMENT Provider Note   CSN: [id number] Arrival date & time: 03/19/18  1649     History   Chief Complaint Chief Complaint  Patient presents with  . Seizures    HPI [name] is a 62 y.o. female presenting for evaluation of left-sided numbness.  Patient states that since yesterday, she has been having episodes of left body numbness.  Patient states when the episodes come on, she has numbness of her left upper extremity and left lower extremity, causing her inability to walk.  She also reports severe left-sided headache.  Symptoms occurred more often today.  She is concerned that they are related to her seizures.  She denies shaking.  She remembers each event, denies incontinence.  Patient seizures are normally [place].  She has a neurologist who she follows with [facility].  She is currently on multiple antiseizure medications, which she has been taking as prescribed.  No recent changes.  She denies recent fall, trauma, fever, chills, cough, abdominal pain, abnormal urination, or abnormal bowel movement.  Per chart review, patient has significant neurologic history including seizures, stroke, cerebral hemorrhage. She is not on blood thinners.  Patient was evaluated for seizures in May of this year with a normal CT and MRI, thought to be more likely neuropathy than seizure.  HPI  Past Medical History:  Diagnosis Date  . Asthma   . Cerebral hemorrhage ([place]) 1989  . Chronic back pain   . COPD (chronic obstructive pulmonary disease) ([place])   . DDD (degenerative disc disease) 06/12/2013  . DVT (deep venous thrombosis) ([place])    "she's had several since 1990"  . GERD (gastroesophageal reflux disease)   . Headache    "usually around time when she's had a seizure"  . History of blood transfusion    "when she was a baby"  . History of stomach ulcers   . Kidney stones   . Seizures ([place])    "because of her brain surgery"  . Stroke [facility])  [phone]   family denies residual on 11/09/2014  . Tunnel vision    "since brain OR"    Patient Active Problem List   Diagnosis Date Noted  . Atypical chest pain 02/17/2017  . Localized swelling of lower extremity 02/17/2017  . GERD (gastroesophageal reflux disease) 02/17/2017  . Costochondritis 02/17/2017  . Seizures ([place]) 11/02/2015  . Gait disturbance 11/02/2015  . Right clavicle fracture 11/02/2015  . Infection of urinary tract 11/02/2015  . Chest pain 03/31/2015  . Acute on chronic respiratory failure with hypoxia ([place]) 03/31/2015  . SOB (shortness of breath) 03/31/2015  . Anxiety state 03/31/2015  . Malnutrition of moderate degree ([place]) 03/31/2015  . COPD exacerbation ([place]) 03/30/2015  . Hypoxia 01/18/2015  . Chronic obstructive pulmonary disease with acute exacerbation ([place])   . Fever 01/17/2015  . Cough 01/17/2015  . Sepsis ([place]) 01/17/2015  . COPD (chronic obstructive pulmonary disease) ([place])   . Unintentional weight loss 01/11/2015  . Asthma 05/15/2014  . Seizure ([place]) 05/15/2014  . Protein calorie malnutrition ([place]) 05/15/2014  . DVT of leg (deep venous thrombosis) ([place]) 06/19/2013  . DDD (degenerative disc disease) 06/12/2013  . Paresthesias 06/12/2013  . Tremor 06/09/2013  . Thrombocytopenia- chronic 06/29/2012  . Noncompliance with medication treatment due to underuse of medication 06/29/2012  . Stress-personal 06/29/2012  . Recurrent seizures ([place]) 06/29/2012  . Tobacco dependence 06/28/2012  . Skull fracture ([place]) 02/28/2012  . Dilantin toxicity 02/28/2012  . Seizure disorder (  [place]) 02/28/2012  . History of Intracranial bleed 02/28/2012    Past Surgical History:  Procedure Laterality Date  . [place]   craniotomy with hematoma evacuation  . CESAREAN SECTION  1979; 1987; 1989  . HEMORRHOID SURGERY    . TUBAL LIGATION  1990's     OB History   None      Home Medications    Prior to Admission medications   Medication Sig Start Date  End Date Taking? Authorizing Provider  lacosamide (VIMPAT) 200 MG TABS tablet Take 200 mg by mouth 2 (two) times daily.    [provider]  pantoprazole (PROTONIX) 40 MG tablet Take 1 tablet (40 mg total) by mouth daily. Patient not taking: Reported on 12/19/2017 02/18/17   Murlean Iba, MD  phenytoin (DILANTIN) 100 MG ER capsule Take 2 capsules (200 mg total) by mouth at bedtime. Patient taking differently: Take 100 mg by mouth 2 (two) times daily.  02/19/17   Johnson, Clanford L, MD  phenytoin (DILANTIN) 30 MG ER capsule Take 60 mg by mouth daily. Take with Dilantin 100 mg to equal 160 mg 09/10/17   [provider]  predniSONE (STERAPRED UNI-PAK 21 TAB) 10 MG (21) TBPK tablet Take by mouth daily. Take 6 tabs by mouth daily  for 2 days, then 5 tabs for 2 days, then 4 tabs for 2 days, then 3 tabs for 2 days, 2 tabs for 2 days, then 1 tab by mouth daily for 2 days Patient not taking: Reported on 12/19/2017 08/05/17   Jeannett Senior, PA-C  vitamin B-12 (CYANOCOBALAMIN) 1000 MCG tablet Take 1 tablet (1,000 mcg total) by mouth daily. Patient not taking: Reported on 12/19/2017 02/18/17   Murlean Iba, MD    Family History Family History  Problem Relation Age of Onset  . Heart attack Mother        4 MIs, started in her 68s, also vaginal cancer and colon cancer  . Cancer Mother   . Heart attack Father        Died suddenly age 72 - autopsy revealed heart attack her patient  . Cancer Brother        Sinus cancer    Social History Social History   Tobacco Use  . Smoking status: Former Smoker    Packs/day: 0.50    Years: 20.00    Pack years: 10.00    Types: Cigarettes    Last attempt to quit: 10/26/2017    Years since quitting: 0.3  . Smokeless tobacco: Never Used  . Tobacco comment: Previously smoked 2 ppd, down to 4 cigs/day  Substance Use Topics  . Alcohol use: No  . Drug use: No     Allergies   Zonisamide; Codeine; Keppra [levetiracetam]; Pregabalin; and  Olive oil   Review of Systems Review of Systems  Neurological: Positive for numbness and headaches.  All other systems reviewed and are negative.    Physical Exam Updated Vital Signs BP (!) 104/42   Pulse (!) 56   Temp 98.5 F (36.9 C) (Rectal)   Resp 13   Ht 5\' 7"  (1.702 m)   Wt 59 kg   SpO2 93%   BMI 20.36 kg/m   Physical Exam  Constitutional: She is oriented to person, place, and time. She appears well-developed and well-nourished. No distress.  Feels warm to the touch, in no acute distress.  HENT:  Head: Normocephalic and atraumatic.  No obvious head trauma.  Eyes: Pupils are equal, round, and  reactive to light. Conjunctivae and EOM are normal.  EOMI and PERRLA.  Neck: Normal range of motion. Neck supple.  Cardiovascular: Normal rate, regular rhythm and intact distal pulses.  Pulmonary/Chest: Effort normal and breath sounds normal. No respiratory distress. She has no wheezes.  Abdominal: Soft. She exhibits no distension and no mass. There is no tenderness. There is no guarding.  Musculoskeletal: Normal range of motion.  Grip strength intact bilaterally.  Radial pedal pulses intact bilaterally.  Patient denying numbness at this time, and sensation intact x4.  Neurological: She is alert and oriented to person, place, and time. She displays normal reflexes. No sensory deficit.  No obvious neurologic deficits.  Patellar reflexes intact.  Negative pronator drift.  Skin: Skin is warm and dry. Capillary refill takes less than 2 seconds.  Psychiatric: She has a normal mood and affect.  Nursing note and vitals reviewed.    ED Treatments / Results  Labs (all labs ordered are listed, but only abnormal results are displayed) Labs Reviewed  PHENYTOIN LEVEL, TOTAL - Abnormal; Notable for the following components:      Result Value   Phenytoin Lvl 33.8 (*)    All other components within normal limits  BASIC METABOLIC PANEL - Abnormal; Notable for the following components:    BUN 5 (*)    Calcium 8.8 (*)    All other components within normal limits  CBC - Abnormal; Notable for the following components:   WBC 3.5 (*)    RBC 3.62 (*)    Hemoglobin 11.9 (*)    MCV 103.3 (*)    Platelets 135 (*)    All other components within normal limits  URINALYSIS, ROUTINE W REFLEX MICROSCOPIC - Abnormal; Notable for the following components:   Color, Urine STRAW (*)    Specific Gravity, Urine 1.004 (*)    All other components within normal limits  URINE CULTURE  CBG MONITORING, ED    EKG None  Radiology Dg Chest 2 View  Result Date: 03/19/2018 CLINICAL DATA:  Seizures EXAM: CHEST - 2 VIEW COMPARISON:  August 05, 2017 FINDINGS: There is focal airspace consolidation with volume loss in a portion of the right middle lobe. Lungs elsewhere clear. Heart size and pulmonary vascularity are normal. No adenopathy. There is aortic atherosclerosis. No pneumothorax. There is evidence of an old healed fracture of the lateral right clavicle. No acute fracture evident. IMPRESSION: Focal airspace consolidation with volume loss in right middle lobe. Lungs elsewhere clear. Stable cardiac silhouette. Aortic atherosclerosis. No pneumothorax. Old fracture lateral right clavicle. Aortic Atherosclerosis (ICD10-I70.0). Followup PA and lateral chest radiographs recommended in 3-4 weeks following trial of antibiotic therapy to ensure resolution and exclude underlying malignancy. It should be noted an obstructing lesion in a segmental right middle lobe bronchus could cause the current appearance. Electronically Signed   By: Lowella Grip III M.D.   On: 03/19/2018 19:51    Procedures Procedures (including critical care time)  Medications Ordered in ED Medications  ketorolac (TORADOL) 15 MG/ML injection 15 mg (15 mg Intravenous Given 03/19/18 1818)     Initial Impression / Assessment and Plan / ED Course  I have reviewed the triage vital signs and the nursing notes.  Pertinent labs & imaging  results that were available during my care of the patient were reviewed by me and considered in my medical decision making (see chart for details).     Patient presenting for evaluation of left-sided numbness.  Physical exam does not show any appreciable numbness  or neurologic deficits.  However, patient with significant neurologic history including seizures, stroke, and intracerebral hemorrhage.  Will obtain labs, CT, Toradol for pain, and reassess.  Labs concerning, she has critically high Dilantin level.  CT head without acute abnormalities.  Will consult with neurology for further evaluation. Case dicussed with attending, Dr. Sherry Ruffing evaluated the pt.   Discussed with Dr. Merrily Pew neurology, who was concerned about patient's Dilantin level.  Recommends admission for recheck of Dilantin.  Level to be checked tomorrow morning.  However, patient symptoms are not typical of Dilantin toxicity.  Recommending MRI to rule out new stroke.  Will call hospitalist service for admission.  Discussed with Dr. Hal Hope from [facility] hospitalist service, patient to be admitted.    Final Clinical Impressions(s) / ED Diagnoses   Final diagnoses:  Accidental phenytoin poisoning, initial encounter  Left sided numbness    ED Discharge Orders    None       Franchot Heidelberg, PA-C 03/20/18 0022    Tegeler, Gwenyth Allegra, MD 03/20/18 [phone]

## 2018-03-19 NOTE — ED Triage Notes (Signed)
Per [facility] pt has history of seizures. Has had back to back seizures for the past 2 days. 5 seizures today. After seizure pt goes numb to left side. No numbness right now. Pt is A&OX4.

## 2018-03-19 NOTE — ED Notes (Signed)
Patient transported to CT 

## 2018-03-19 NOTE — ED Notes (Signed)
Attempted report x1. 

## 2018-03-19 NOTE — ED Notes (Addendum)
Pharmacy Tech at bedside  

## 2018-03-19 NOTE — ED Notes (Signed)
Pt reports that she has had siezures for 30 years c/o numbness lt side she is calling this a seizure

## 2018-03-19 NOTE — H&P (Signed)
History and Physical    [name]:[id number] DOB: [birth year] DOA: 03/19/2018  PCP: [name], MD  Patient coming from: Home.  Chief Complaint: Difficulty walking.  HPI: [name] is a 62 y.o. female with history of seizures and asthma has been experiencing difficulty walking last 2 days with ataxia.  Also has been having numbness running from the neck down towards the left side of her body.  Denies any fall.  Denies any seizures.  ED Course: In the ER patient is a Dilaudid level is found to be elevated at around 33.8 which could explain patient's symptoms but since patient has had left-sided numbness neurologist on-call has been consulted who requested MRI brain to rule out any stroke.  Exam patient is moving all extremities with mild weakness of the left side.  Review of Systems: As per HPI, rest all negative.   Past Medical History:  Diagnosis Date  . Asthma   . Cerebral hemorrhage ([place]) 1989  . Chronic back pain   . COPD (chronic obstructive pulmonary disease) ([place])   . DDD (degenerative disc disease) 06/12/2013  . DVT (deep venous thrombosis) ([place])    "she's had several since 1990"  . GERD (gastroesophageal reflux disease)   . Headache    "usually around time when she's had a seizure"  . History of blood transfusion    "when she was a baby"  . History of stomach ulcers   . Kidney stones   . Seizures ([place])    "because of her brain surgery"  . Stroke [facility]) [phone]   family denies residual on 11/09/2014  . Tunnel vision    "since brain OR"    Past Surgical History:  Procedure Laterality Date  . [place]   craniotomy with hematoma evacuation  . CESAREAN SECTION  1979; 1987; 1989  . HEMORRHOID SURGERY    . TUBAL LIGATION  1990's     reports that she quit smoking about 4 months ago. Her smoking use included cigarettes. She has a 10.00 pack-year smoking history. She has never used smokeless tobacco. She reports that she does not drink alcohol or  use drugs.  Allergies  Allergen Reactions  . Zonisamide Other (See Comments)    Numbness and tingling over whole body  . Codeine Nausea And Vomiting  . Keppra [Levetiracetam] Other (See Comments)    Causes seizures.   . Pregabalin Nausea And Vomiting  . Olive Oil Rash    Family History  Problem Relation Age of Onset  . Heart attack Mother        4 MIs, started in her 10s, also vaginal cancer and colon cancer  . Cancer Mother   . Heart attack Father        Died suddenly age 59 - autopsy revealed heart attack her patient  . Cancer Brother        Sinus cancer    Prior to Admission medications   Medication Sig Start Date End Date Taking? Authorizing Provider  acetaminophen (TYLENOL) 325 MG tablet Take 650 mg by mouth daily as needed for headache.   Yes [provider]  albuterol (PROVENTIL HFA;VENTOLIN HFA) 108 (90 Base) MCG/ACT inhaler Inhale 2 puffs into the lungs every 6 (six) hours as needed for wheezing or shortness of breath.   Yes [provider]  lacosamide (VIMPAT) 200 MG TABS tablet Take 200 mg by mouth 2 (two) times daily.   Yes [provider]  phenytoin (DILANTIN) 100 MG ER  capsule Take 2 capsules (200 mg total) by mouth at bedtime. Patient taking differently: Take 100-200 mg by mouth See admin instructions. Take one capsule (100 mg) by mouth with two 30 mg capsules every morning (total dose 160 mg) and take two capsules (200 mg) at night 02/19/17  Yes Johnson, Clanford L, MD  phenytoin (DILANTIN) 30 MG ER capsule Take 60 mg by mouth See admin instructions. Take two capsules (30 mg) by mouth every morning with a 100 mg capsule for a total dose of 160 mg 09/10/17  Yes [provider]  pantoprazole (PROTONIX) 40 MG tablet Take 1 tablet (40 mg total) by mouth daily. Patient not taking: Reported on 12/19/2017 02/18/17   Murlean Iba, MD  vitamin B-12 (CYANOCOBALAMIN) 1000 MCG tablet Take 1 tablet (1,000 mcg total) by mouth daily. Patient  not taking: Reported on 12/19/2017 02/18/17   Murlean Iba, MD    Physical Exam: Vitals:   03/19/18 1930 03/19/18 2030 03/19/18 2130 03/19/18 2210  BP: (!) 104/42 (!) 112/52 (!) 111/56 (!) 111/56  Pulse: (!) 56 63 66 61  Resp: 13 (!) 9 15 18   Temp:    98.1 F (36.7 C)  TempSrc:    Oral  SpO2: 93% 92% 94% 94%  Weight:    59.2 kg  Height:    5\' 7"  (1.702 m)      Constitutional: Moderately built and nourished. Vitals:   03/19/18 1930 03/19/18 2030 03/19/18 2130 03/19/18 2210  BP: (!) 104/42 (!) 112/52 (!) 111/56 (!) 111/56  Pulse: (!) 56 63 66 61  Resp: 13 (!) 9 15 18   Temp:    98.1 F (36.7 C)  TempSrc:    Oral  SpO2: 93% 92% 94% 94%  Weight:    59.2 kg  Height:    5\' 7"  (1.702 m)   Eyes: Anicteric no pallor. ENMT: No discharge from the ears eyes nose or mouth. Neck: No mass felt.  No neck rigidity.  No JVD appreciated. Respiratory: No rhonchi or crepitations. Cardiovascular: S1-S2 heard no murmurs appreciated. Abdomen: Soft nontender bowel sounds present. Musculoskeletal: No edema.  No joint effusion. Skin: No rash. Neurologic: Alert awake oriented to time place and person.  Moves all extremities 5 x 5 with mild weakness of the left upper lower extremity.  No facial asymmetry tongue is midline. Psychiatric: Appears normal per normal affect.   Labs on Admission: I have personally reviewed following labs and imaging studies  CBC: Recent Labs  Lab 03/19/18 1752  WBC 3.5*  HGB 11.9*  HCT 37.4  MCV 103.3*  PLT 081*   Basic Metabolic Panel: Recent Labs  Lab 03/19/18 1752  NA 142  K 3.6  CL 109  CO2 26  GLUCOSE 88  BUN 5*  CREATININE 0.65  CALCIUM 8.8*   GFR: Estimated Creatinine Clearance: 69.9 mL/min (by C-G formula based on SCr of 0.65 mg/dL). Liver Function Tests: No results for input(s): AST, ALT, ALKPHOS, BILITOT, PROT, ALBUMIN in the last 168 hours. No results for input(s): LIPASE, AMYLASE in the last 168 hours. No results for input(s):  AMMONIA in the last 168 hours. Coagulation Profile: No results for input(s): INR, PROTIME in the last 168 hours. Cardiac Enzymes: No results for input(s): CKTOTAL, CKMB, CKMBINDEX, TROPONINI in the last 168 hours. BNP (last 3 results) No results for input(s): PROBNP in the last 8760 hours. HbA1C: No results for input(s): HGBA1C in the last 72 hours. CBG: Recent Labs  Lab 03/19/18 1746  GLUCAP 87  Lipid Profile: No results for input(s): CHOL, HDL, LDLCALC, TRIG, CHOLHDL, LDLDIRECT in the last 72 hours. Thyroid Function Tests: No results for input(s): TSH, T4TOTAL, FREET4, T3FREE, THYROIDAB in the last 72 hours. Anemia Panel: No results for input(s): VITAMINB12, FOLATE, FERRITIN, TIBC, IRON, RETICCTPCT in the last 72 hours. Urine analysis:    Component Value Date/Time   COLORURINE STRAW (A) 03/19/2018 1848   APPEARANCEUR CLEAR 03/19/2018 1848   LABSPEC 1.004 (L) 03/19/2018 1848   PHURINE 7.0 03/19/2018 1848   GLUCOSEU NEGATIVE 03/19/2018 1848   HGBUR NEGATIVE 03/19/2018 1848   BILIRUBINUR NEGATIVE 03/19/2018 1848   KETONESUR NEGATIVE 03/19/2018 1848   PROTEINUR NEGATIVE 03/19/2018 1848   UROBILINOGEN 0.2 01/17/2015 2350   NITRITE NEGATIVE 03/19/2018 1848   LEUKOCYTESUR NEGATIVE 03/19/2018 1848   Sepsis Labs: @LABRCNTIP (procalcitonin:4,lacticidven:4) )No results found for this or any previous visit (from the past 240 hour(s)).   Radiological Exams on Admission: Dg Chest 2 View  Result Date: 03/19/2018 CLINICAL DATA:  Seizures EXAM: CHEST - 2 VIEW COMPARISON:  August 05, 2017 FINDINGS: There is focal airspace consolidation with volume loss in a portion of the right middle lobe. Lungs elsewhere clear. Heart size and pulmonary vascularity are normal. No adenopathy. There is aortic atherosclerosis. No pneumothorax. There is evidence of an old healed fracture of the lateral right clavicle. No acute fracture evident. IMPRESSION: Focal airspace consolidation with volume loss in  right middle lobe. Lungs elsewhere clear. Stable cardiac silhouette. Aortic atherosclerosis. No pneumothorax. Old fracture lateral right clavicle. Aortic Atherosclerosis (ICD10-I70.0). Followup PA and lateral chest radiographs recommended in 3-4 weeks following trial of antibiotic therapy to ensure resolution and exclude underlying malignancy. It should be noted an obstructing lesion in a segmental right middle lobe bronchus could cause the current appearance. Electronically Signed   By: Lowella Grip III M.D.   On: 03/19/2018 19:51   Ct Head Wo Contrast  Result Date: 03/19/2018 CLINICAL DATA:  5 seizures today. History of seizures. Transient left-sided numbness following the seizures. EXAM: CT HEAD WITHOUT CONTRAST TECHNIQUE: Contiguous axial images were obtained from the base of the skull through the vertex without intravenous contrast. COMPARISON:  Brain MR dated 12/19/2017. Head CT dated 12/19/2017. 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 Stable mildly enlarged ventricles and cortical sulci. No intracranial hemorrhage, mass lesion or CT evidence of acute infarction. Vascular: No hyperdense vessel or unexpected calcification. Skull: Stable right posterior craniotomy changes. Sinuses/Orbits: Unremarkable. Other: None. IMPRESSION: 1. No acute abnormality. 2. 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 3. Stable mild diffuse cerebral and cerebellar atrophy. Electronically Signed   By: Claudie Revering M.D.   On: 03/19/2018 20:22      Assessment/Plan Principal Problem:   Dilantin toxicity Active Problems:   Seizure disorder (HCC)   History of Intracranial bleed   Left-sided weakness    1. Dilantin toxicity -patient is ataxia could be from Dilantin toxicity.  However since patient was complaining of left upper lower extremity weakness and numbness MRI of the brain and  C-spine has been ordered which is pending.  Hold Dilantin recheck Dilantin level in the morning. 2. History of seizures on Vimpat and Dilantin.  Will patch will be continued and Dilantin will be on hold due to Dilantin toxicity.  Recheck Dilantin levels in the morning. 3. History of asthma presently not wheezing. 4. Previous history of intracranial bleed. 5. History of DVT.   DVT prophylaxis:  SCDs. Code Status: Full code. Family Communication: Discussed with patient. Disposition Plan: Home. Consults called: ER physician discussed with neurologist. Admission status: Observation.   Rise Patience MD [facility] Pager [phone].  If 7PM-7AM, please contact night-coverage [email or web address] Password [facility]  03/19/2018, 11:05 PM

## 2018-03-19 NOTE — ED Notes (Signed)
C/o head pain

## 2018-03-19 NOTE — ED Notes (Signed)
Pa at the bedside

## 2018-03-20 ENCOUNTER — Observation Stay (HOSPITAL_COMMUNITY): Payer: Medicare HMO

## 2018-03-20 DIAGNOSIS — M47816 Spondylosis without myelopathy or radiculopathy, lumbar region: Secondary | ICD-10-CM | POA: Diagnosis not present

## 2018-03-20 DIAGNOSIS — M4802 Spinal stenosis, cervical region: Secondary | ICD-10-CM | POA: Diagnosis not present

## 2018-03-20 DIAGNOSIS — M5126 Other intervertebral disc displacement, lumbar region: Secondary | ICD-10-CM | POA: Diagnosis not present

## 2018-03-20 DIAGNOSIS — M50223 Other cervical disc displacement at C6-C7 level: Secondary | ICD-10-CM | POA: Diagnosis not present

## 2018-03-20 DIAGNOSIS — R200 Anesthesia of skin: Secondary | ICD-10-CM | POA: Diagnosis not present

## 2018-03-20 LAB — COMPREHENSIVE METABOLIC PANEL
ALT: 11 U/L (ref 0–44)
AST: 16 U/L (ref 15–41)
Albumin: 3.3 g/dL — ABNORMAL LOW (ref 3.5–5.0)
Alkaline Phosphatase: 48 U/L (ref 38–126)
Anion gap: 7 (ref 5–15)
BILIRUBIN TOTAL: 0.7 mg/dL (ref 0.3–1.2)
BUN: 6 mg/dL (ref 6–20)
CO2: 27 mmol/L (ref 22–32)
Calcium: 8.6 mg/dL — ABNORMAL LOW (ref 8.9–10.3)
Chloride: 110 mmol/L (ref 98–111)
Creatinine, Ser: 0.77 mg/dL (ref 0.44–1.00)
Glucose, Bld: 83 mg/dL (ref 70–99)
POTASSIUM: 3.6 mmol/L (ref 3.5–5.1)
Sodium: 144 mmol/L (ref 135–145)
TOTAL PROTEIN: 5.5 g/dL — AB (ref 6.5–8.1)

## 2018-03-20 LAB — IRON AND TIBC
IRON: 154 ug/dL (ref 28–170)
Saturation Ratios: 64 % — ABNORMAL HIGH (ref 10.4–31.8)
TIBC: 242 ug/dL — ABNORMAL LOW (ref 250–450)
UIBC: 88 ug/dL

## 2018-03-20 LAB — RAPID URINE DRUG SCREEN, HOSP PERFORMED
Amphetamines: NOT DETECTED
Barbiturates: NOT DETECTED
Benzodiazepines: NOT DETECTED
Cocaine: NOT DETECTED
OPIATES: NOT DETECTED
Tetrahydrocannabinol: NOT DETECTED

## 2018-03-20 LAB — RETICULOCYTES
RBC.: 3.69 MIL/uL — ABNORMAL LOW (ref 3.87–5.11)
RETIC COUNT ABSOLUTE: 44.3 10*3/uL (ref 19.0–186.0)
RETIC CT PCT: 1.2 % (ref 0.4–3.1)

## 2018-03-20 LAB — URINE CULTURE

## 2018-03-20 LAB — FERRITIN: FERRITIN: 35 ng/mL (ref 11–307)

## 2018-03-20 LAB — VITAMIN B12: VITAMIN B 12: 168 pg/mL — AB (ref 180–914)

## 2018-03-20 LAB — FOLATE: Folate: 7 ng/mL (ref >5.9)

## 2018-03-20 LAB — HIV ANTIBODY (ROUTINE TESTING W REFLEX): HIV SCREEN 4TH GENERATION: NONREACTIVE

## 2018-03-20 LAB — PHENYTOIN LEVEL, TOTAL: PHENYTOIN LVL: 27.7 ug/mL — AB (ref 10.0–20.0)

## 2018-03-20 MED ORDER — GABAPENTIN 300 MG PO CAPS
300.0000 mg | ORAL_CAPSULE | Freq: Once | ORAL | Status: AC
  Administered 2018-03-20: 300 mg via ORAL
  Filled 2018-03-20: qty 1

## 2018-03-20 NOTE — Progress Notes (Signed)
PROGRESS NOTE    LORAYNE GETCHELL  SJG:[id number] DOB: Dec 20, 1955 DOA: 03/19/2018 PCP: [name], MD    Brief Narrative:  62 y.o. female with history of seizures and asthma has been experiencing difficulty walking last 2 days with ataxia.  Also has been having numbness running from the neck down towards the left side of her body.  Denies any fall.  Denies any seizures.  ED Course: In the ER patient is a Dilaudid level is found to be elevated at around 33.8 which could explain patient's symptoms but since patient has had left-sided numbness neurologist on-call has been consulted who requested MRI brain to rule out any stroke.  Exam patient is moving all extremities with mild weakness of the left side.  Assessment & Plan:   Principal Problem:   Dilantin toxicity Active Problems:   Seizure disorder ([place]lie)   History of Intracranial bleed   Left-sided weakness  1. Dilantin toxicity with ataxia 1. Possible ataxia from Dilantin toxicity.   2. Dilantin on hold, levels improving although still in the supertherapeutic range 3. Continue to hold dilantin. Repeat level in AM 4. Seen by Neurology in consultation. Recommendations to follow up MRI lumbar spine. Study performed, awaiting result 5. PT recommends CIR 2. History of seizures on Vimpat and Dilantin.   1. Vimpat will be continued 2. Dilantin will be on hold per above 3. Stable at present. 3. History of asthma 1. Currently on minimal O2 support 2. No wheezing 4. Previous history of intracranial bleed. 1. Stable at present 5. History of DVT. 1. Stable currently  DVT prophylaxis: SCD's Code Status: Full Family Communication: Pt in room, family not at bedside Disposition Plan: Uncertain at this time  Consultants:   Neurology  Procedures:     Antimicrobials: Anti-infectives (From admission, onward)   None       Subjective: Reports leg weakness  Objective: Vitals:   03/20/18 0510 03/20/18 0834 03/20/18 1218  03/20/18 1636  BP: (!) 92/45 107/62 (!) 87/57 (!) 103/47  Pulse: (!) 50 (!) 57 67 63  Resp:  16 16 20   Temp: 97.7 F (36.5 C) 98 F (36.7 C) 98.1 F (36.7 C)   TempSrc: Oral Oral    SpO2: 96% 95% 92% 95%  Weight:      Height:        Intake/Output Summary (Last 24 hours) at 03/20/2018 1856 Last data filed at 03/20/2018 1500 Gross per 24 hour  Intake 675.98 ml  Output -  Net 675.98 ml   Filed Weights   03/19/18 1627 03/19/18 2210  Weight: 59 kg 59.2 kg    Examination:  General exam: Appears calm and comfortable  Respiratory system: Clear to auscultation. Respiratory effort normal. Cardiovascular system: S1 & S2 heard, RRR. Gastrointestinal system: Abdomen is nondistended, soft and nontender. No organomegaly or masses felt. Normal bowel sounds heard. Central nervous system: Alert and oriented. No focal neurological deficits. Extremities: Symmetric 5 x 5 power. Skin: No rashes, lesions= Psychiatry: Judgement and insight appear normal. Mood & affect appropriate.   Data Reviewed: I have personally reviewed following labs and imaging studies  CBC: Recent Labs  Lab 03/19/18 1752  WBC 3.5*  HGB 11.9*  HCT 37.4  MCV 103.3*  PLT 654*   Basic Metabolic Panel: Recent Labs  Lab 03/19/18 1752 03/20/18 0535  NA 142 144  K 3.6 3.6  CL 109 110  CO2 26 27  GLUCOSE 88 83  BUN 5* 6  CREATININE 0.65 0.77  CALCIUM 8.8* 8.6*  GFR: Estimated Creatinine Clearance: 69.9 mL/min (by C-G formula based on SCr of 0.77 mg/dL). Liver Function Tests: Recent Labs  Lab 03/20/18 0535  AST 16  ALT 11  ALKPHOS 48  BILITOT 0.7  PROT 5.5*  ALBUMIN 3.3*   No results for input(s): LIPASE, AMYLASE in the last 168 hours. No results for input(s): AMMONIA in the last 168 hours. Coagulation Profile: No results for input(s): INR, PROTIME in the last 168 hours. Cardiac Enzymes: No results for input(s): CKTOTAL, CKMB, CKMBINDEX, TROPONINI in the last 168 hours. BNP (last 3 results) No  results for input(s): PROBNP in the last 8760 hours. HbA1C: No results for input(s): HGBA1C in the last 72 hours. CBG: Recent Labs  Lab 03/19/18 1746  GLUCAP 87   Lipid Profile: No results for input(s): CHOL, HDL, LDLCALC, TRIG, CHOLHDL, LDLDIRECT in the last 72 hours. Thyroid Function Tests: No results for input(s): TSH, T4TOTAL, FREET4, T3FREE, THYROIDAB in the last 72 hours. Anemia Panel: Recent Labs    03/20/18 0810  VITAMINB12 168*  FOLATE 7.0  FERRITIN 35  TIBC 242*  IRON 154  RETICCTPCT 1.2   Sepsis Labs: No results for input(s): PROCALCITON, LATICACIDVEN in the last 168 hours.  Recent Results (from the past 240 hour(s))  Urine culture     Status: Abnormal   Collection Time: 03/19/18  6:48 PM  Result Value Ref Range Status   Specimen Description URINE, CLEAN CATCH  Final   Special Requests   Final    NONE Performed at [facility], [address] [place], [place] [address]    Culture MULTIPLE SPECIES PRESENT, SUGGEST RECOLLECTION (A)  Final   Report Status 03/20/2018 FINAL  Final     Radiology Studies: Dg Chest 2 View  Result Date: 03/19/2018 CLINICAL DATA:  Seizures EXAM: CHEST - 2 VIEW COMPARISON:  August 05, 2017 FINDINGS: There is focal airspace consolidation with volume loss in a portion of the right middle lobe. Lungs elsewhere clear. Heart size and pulmonary vascularity are normal. No adenopathy. There is aortic atherosclerosis. No pneumothorax. There is evidence of an old healed fracture of the lateral right clavicle. No acute fracture evident. IMPRESSION: Focal airspace consolidation with volume loss in right middle lobe. Lungs elsewhere clear. Stable cardiac silhouette. Aortic atherosclerosis. No pneumothorax. Old fracture lateral right clavicle. Aortic Atherosclerosis (ICD10-I70.0). Followup PA and lateral chest radiographs recommended in 3-4 weeks following trial of antibiotic therapy to ensure resolution and exclude underlying malignancy. It  should be noted an obstructing lesion in a segmental right middle lobe bronchus could cause the current appearance. Electronically Signed   By: Lowella Grip III M.D.   On: 03/19/2018 19:51   Ct Head Wo Contrast  Result Date: 03/19/2018 CLINICAL DATA:  5 seizures today. History of seizures. Transient left-sided numbness following the seizures. EXAM: CT HEAD WITHOUT CONTRAST TECHNIQUE: Contiguous axial images were obtained from the base of the skull through the vertex without intravenous contrast. COMPARISON:  Brain MR dated 12/19/2017. Head CT dated 12/19/2017. 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 Stable mildly enlarged ventricles and cortical sulci. No intracranial hemorrhage, mass lesion or CT evidence of acute infarction. Vascular: No hyperdense vessel or unexpected calcification. Skull: Stable right posterior craniotomy changes. Sinuses/Orbits: Unremarkable. Other: None. IMPRESSION: 1. No acute abnormality. 2. 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 3. Stable mild diffuse cerebral and cerebellar atrophy. Electronically Signed   By: Remo Lipps  Joneen Caraway M.D.   On: 03/19/2018 20:22   Mr Brain Wo Contrast  Result Date: 03/20/2018 CLINICAL DATA:  Initial evaluation for acute left-sided numbness. EXAM: MRI HEAD WITHOUT CONTRAST MRI CERVICAL SPINE WITHOUT CONTRAST TECHNIQUE: Multiplanar, multiecho pulse sequences of the brain and surrounding structures, and cervical spine, to include the craniocervical junction and cervicothoracic junction, were obtained without intravenous contrast. COMPARISON:  Prior CT from 03/19/2018 as well as previous MRI from 12/19/2017. FINDINGS: MRI HEAD FINDINGS Brain: Cerebral volume within normal limits and stable. Large volume right parieto-occipital encephalomalacia, unchanged. No acute intracranial infarct. No acute or chronic intracranial  hemorrhage. No mass lesion, midline shift or mass effect. No hydrocephalus. Ex vacuo dilatation of the right lateral ventricle related to the right cerebral encephalomalacia. No extra-axial fluid collection. Normal pituitary gland. Vascular: Major intracranial vascular flow voids are maintained. Skull and upper cervical spine: Craniocervical junction normal. Prior right parietooccipital craniectomy with cranioplasty. No scalp soft tissue abnormality. Bone marrow signal intensity normal. Sinuses/Orbits: Globes and orbital soft tissues within normal limits. Paranasal sinuses are clear. Small left mastoid effusion, of doubtful significance. Other: None. MRI CERVICAL SPINE FINDINGS Alignment: Vertebral bodies normally aligned with preservation of the normal cervical lordosis. No listhesis. Vertebrae: Vertebral body height maintained. No acute or chronic fracture. Bone marrow signal intensity mildly heterogeneous but within normal limits. Reactive endplate changes about the C5-6 and C6-7 interspaces. No worrisome osseous lesions. Cord: Signal intensity within the cervical spinal cord is normal Posterior Fossa, vertebral arteries, paraspinal tissues: Paraspinous soft tissues within normal limits. Normal intravascular flow voids present within the vertebral arteries bilaterally. Disc levels: C2-C3: Unremarkable. C3-C4: Mild diffuse disc bulge with uncovertebral hypertrophy. Mild left-sided facet hypertrophy. No stenosis. C4-C5: Mild disc bulge. Prominent left-sided facet degeneration. Resultant moderate left C5 foraminal stenosis. No canal or right foraminal narrowing. C5-C6: Chronic diffuse degenerative disc osteophyte, asymmetric to the right. Broad posterior component flattens the ventral CSF. Mild spinal stenosis without cord deformity. Severe right C6 foraminal stenosis. C6-C7: Diffuse circumferential disc osteophyte. Flattening of the ventral CSF with mild spinal stenosis. Foramina remain patent. C7-T1:   Unremarkable. Visualized upper thoracic spine within normal limits. IMPRESSION: MRI HEAD IMPRESSION: 1. No acute intracranial abnormality. 2. Chronic right parieto-occipital encephalomalacia, stable. MRI CERVICAL SPINE IMPRESSION: 1. No acute abnormality within the cervical spine. 2. Prominent left-sided facet hypertrophy at C4-5 with resultant moderate left C5 foraminal stenosis. 3. Degenerative disc osteophyte at C5-6 with resultant severe right C6 foraminal stenosis. 4. Degenerative spondylolysis at C5-6 and C6-7 with resultant mild spinal stenosis. Electronically Signed   By: Jeannine Boga M.D.   On: 03/20/2018 05:27   Mr Cervical Spine Wo Contrast  Result Date: 03/20/2018 CLINICAL DATA:  Initial evaluation for acute left-sided numbness. EXAM: MRI HEAD WITHOUT CONTRAST MRI CERVICAL SPINE WITHOUT CONTRAST TECHNIQUE: Multiplanar, multiecho pulse sequences of the brain and surrounding structures, and cervical spine, to include the craniocervical junction and cervicothoracic junction, were obtained without intravenous contrast. COMPARISON:  Prior CT from 03/19/2018 as well as previous MRI from 12/19/2017. FINDINGS: MRI HEAD FINDINGS Brain: Cerebral volume within normal limits and stable. Large volume right parieto-occipital encephalomalacia, unchanged. No acute intracranial infarct. No acute or chronic intracranial hemorrhage. No mass lesion, midline shift or mass effect. No hydrocephalus. Ex vacuo dilatation of the right lateral ventricle related to the right cerebral encephalomalacia. No extra-axial fluid collection. Normal pituitary gland. Vascular: Major intracranial vascular flow voids are maintained. Skull and upper cervical spine: Craniocervical junction normal. Prior right parietooccipital craniectomy with cranioplasty. No scalp  soft tissue abnormality. Bone marrow signal intensity normal. Sinuses/Orbits: Globes and orbital soft tissues within normal limits. Paranasal sinuses are clear. Small left  mastoid effusion, of doubtful significance. Other: None. MRI CERVICAL SPINE FINDINGS Alignment: Vertebral bodies normally aligned with preservation of the normal cervical lordosis. No listhesis. Vertebrae: Vertebral body height maintained. No acute or chronic fracture. Bone marrow signal intensity mildly heterogeneous but within normal limits. Reactive endplate changes about the C5-6 and C6-7 interspaces. No worrisome osseous lesions. Cord: Signal intensity within the cervical spinal cord is normal Posterior Fossa, vertebral arteries, paraspinal tissues: Paraspinous soft tissues within normal limits. Normal intravascular flow voids present within the vertebral arteries bilaterally. Disc levels: C2-C3: Unremarkable. C3-C4: Mild diffuse disc bulge with uncovertebral hypertrophy. Mild left-sided facet hypertrophy. No stenosis. C4-C5: Mild disc bulge. Prominent left-sided facet degeneration. Resultant moderate left C5 foraminal stenosis. No canal or right foraminal narrowing. C5-C6: Chronic diffuse degenerative disc osteophyte, asymmetric to the right. Broad posterior component flattens the ventral CSF. Mild spinal stenosis without cord deformity. Severe right C6 foraminal stenosis. C6-C7: Diffuse circumferential disc osteophyte. Flattening of the ventral CSF with mild spinal stenosis. Foramina remain patent. C7-T1:  Unremarkable. Visualized upper thoracic spine within normal limits. IMPRESSION: MRI HEAD IMPRESSION: 1. No acute intracranial abnormality. 2. Chronic right parieto-occipital encephalomalacia, stable. MRI CERVICAL SPINE IMPRESSION: 1. No acute abnormality within the cervical spine. 2. Prominent left-sided facet hypertrophy at C4-5 with resultant moderate left C5 foraminal stenosis. 3. Degenerative disc osteophyte at C5-6 with resultant severe right C6 foraminal stenosis. 4. Degenerative spondylolysis at C5-6 and C6-7 with resultant mild spinal stenosis. Electronically Signed   By: Jeannine Boga M.D.    On: 03/20/2018 05:27    Scheduled Meds: . lacosamide  200 mg Oral BID   Continuous Infusions: . sodium chloride 50 mL/hr at 03/20/18 1500     LOS: 0 days   Marylu Lund, MD [facility] Pager [phone]  If 7PM-7AM, please contact night-coverage [email or web address] Password [facility] 03/20/2018, 6:56 PM

## 2018-03-20 NOTE — Evaluation (Signed)
Occupational Therapy Evaluation Patient Details Name: [name] MRN: [id number] DOB: 01-Dec-1955 Today's Date: 03/20/2018    History of Present Illness [name] is an 62 y.o. female past medical history of ICH, seizures, DVT chronic back pain, COPD presents to the emergency room for left leg weakness, drowsiness in the morning of 8/16.  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    Clinical Impression   PTA Pt independent in ADL and IADL. [place]. Pt is currently min guard for SEATED ADL (Pt typically able to stand for ADL). Mod to min A for transfers with RW (Pt previously did not use any DME). Decrease in coordination and sensation in LUE. Pt perseverating on events leading up to and following her seizure. Pt with significant decrease in independence in ADL and transfers and will require skilled OT in the acute setting as well as CIR level therapy to return to PLOF. Pt is highly motivated, has good support in family. Next session to work on standing balance in anticipation for grooming tasks.     Follow Up Recommendations  CIR;Supervision/Assistance - 24 hour    Equipment Recommendations  Other (comment);3 in 1 bedside commode;Tub/shower seat(defer to next venue)    Recommendations for Other Services Rehab consult     Precautions / Restrictions Precautions Precautions: Fall Restrictions Weight Bearing Restrictions: No      Mobility Bed Mobility Overal bed mobility: Needs Assistance Bed Mobility: Rolling;Sidelying to Sit;Sit to Sidelying Rolling: Supervision Sidelying to sit: Min guard(rolling to the left, use of bed rail to push up)     Sit to sidelying: Min assist;HOB elevated(min A for BLE back into bed) General bed mobility comments: All bed mobility required significantly increased time and effort, use of bed rail  Transfers Overall  transfer level: Needs assistance Equipment used: Rolling walker (2 wheeled) Transfers: Sit to/from Stand Sit to Stand: Min assist         General transfer comment: unable to stand without UE support    Balance Overall balance assessment: Needs assistance Sitting-balance support: Single extremity supported Sitting balance-Leahy Scale: Fair     Standing balance support: Bilateral upper extremity supported Standing balance-Leahy Scale: Poor Standing balance comment: dependent on UE support                           ADL either performed or assessed with clinical judgement   ADL Overall ADL's : Needs assistance/impaired Eating/Feeding: Modified independent;Sitting;Set up Eating/Feeding Details (indicate cue type and reason): eating a sandwich when OT entered, discoordinated movement with utensils Grooming: Minimal assistance;Wash/dry hands;Wash/dry face;Sitting Grooming Details (indicate cue type and reason): MUST be seated for grooming task completion, requires BUE for standing Upper Body Bathing: Min guard;Sitting   Lower Body Bathing: Min guard;Sitting/lateral leans Lower Body Bathing Details (indicate cue type and reason): able to cross feet to knees Upper Body Dressing : Min guard   Lower Body Dressing: Min guard;Sitting/lateral leans Lower Body Dressing Details (indicate cue type and reason): when transitioning from sit <>Stand for pants or underwear she will require mod A Toilet Transfer: Moderate assistance;Ambulation;RW;Comfort height toilet;Grab bars Toilet Transfer Details (indicate cue type and reason): dependent on RW - L knee buckling Toileting- Clothing Manipulation and Hygiene: Min guard;Sitting/lateral lean       Functional mobility during ADLs: Moderate assistance;Rolling walker  Vision Baseline Vision/History: (only has central vision - no peripheral vision) Patient Visual Report: No change from baseline       Perception     Praxis       Pertinent Vitals/Pain Pain Assessment: Faces Faces Pain Scale: Hurts even more Pain Location: pain LUE and LLE Pain Descriptors / Indicators: Aching Pain Intervention(s): Limited activity within patient's tolerance;Monitored during session;Repositioned     Hand Dominance Right   Extremity/Trunk Assessment Upper Extremity Assessment Upper Extremity Assessment: LUE deficits/detail;Generalized weakness LUE Deficits / Details: slightly ataxic with finger to nose, discoordinated movement with supination/pronation, increased time and effort for all movement LUE Sensation: decreased light touch;decreased proprioception(pt reports tingling) LUE Coordination: decreased fine motor;decreased gross motor(inreased time and effort)   Lower Extremity Assessment Lower Extremity Assessment: Defer to PT evaluation LLE Deficits / Details: hip flex 3+/5, knee ext 3+/5 but has a hard time maintaining contraction. pt reports pain from hip down to foot. Has difficuly placing foot properly on floor to stand. In standing pt feel has sensation that LLE is hanging as dead weight.   LLE Sensation: decreased light touch(numbness)   Cervical / Trunk Assessment Cervical / Trunk Assessment: Normal   Communication Communication Communication: No difficulties   Cognition Arousal/Alertness: Awake/alert Behavior During Therapy: WFL for tasks assessed/performed Overall Cognitive Status: No family/caregiver present to determine baseline cognitive functioning                                 General Comments: pt with great concern about what's going on and she reports she has been doing well, [place] and was getting ready to fish with boyfriend when event happened yesterday; repeating herself and perseverating on events   General Comments  Pt sees a correlation between chocolate and seizures, so she usually avoids it - had cookies and cream ice cream the night before     Exercises     Shoulder Instructions      Home Living Family/patient expects to be discharged to:: Private residence Living Arrangements: Children Available Help at Discharge: Family;Available PRN/intermittently Type of Home: House Home Access: Level entry     Home Layout: Two level Alternate Level Stairs-Number of Steps: flight Alternate Level Stairs-Rails: Right Bathroom Shower/Tub: Tub/shower unit;Walk-in shower   Bathroom Toilet: Standard     Home Equipment: None   Additional Comments: she lives with her mother-in-law, daughter, and grandchildren      Prior Functioning/Environment Level of Independence: Needs assistance  Gait / Transfers Assistance Needed: independent, no AD ADL's / Homemaking Assistance Needed: does not drive            OT Problem List: Decreased strength;Decreased activity tolerance;Impaired balance (sitting and/or standing);Decreased coordination;Decreased safety awareness;Decreased knowledge of use of DME or AE;Impaired sensation;Impaired UE functional use      OT Treatment/Interventions: Self-care/ADL training;Neuromuscular education;DME and/or AE instruction;Therapeutic activities;Patient/family education;Balance training    OT Goals(Current goals can be found in the care plan section) Acute Rehab OT Goals Patient Stated Goal: get back to independent OT Goal Formulation: With patient Time For Goal Achievement: 04/03/18 Potential to Achieve Goals: Good ADL Goals Pt Will Perform Grooming: with modified independence;standing Pt Will Perform Upper Body Dressing: sitting;with modified independence Pt Will Perform Lower Body Dressing: with supervision;sit to/from stand Pt Will Transfer to Toilet: with supervision;ambulating Pt Will Perform Toileting - Clothing Manipulation and hygiene: with modified independence;sitting/lateral leans Additional ADL Goal #1: Pt  will perform bed mobility at mod I level prior to engaging in ADL activity  OT  Frequency: Min 3X/week   Barriers to D/C:            Co-evaluation              AM-PAC PT "6 Clicks" Daily Activity     Outcome Measure Help from another person eating meals?: A Little Help from another person taking care of personal grooming?: A Little(in sitting) Help from another person toileting, which includes using toliet, bedpan, or urinal?: A Lot Help from another person bathing (including washing, rinsing, drying)?: A Little(in sitting) Help from another person to put on and taking off regular upper body clothing?: A Little Help from another person to put on and taking off regular lower body clothing?: A Little 6 Click Score: 17   End of Session Equipment Utilized During Treatment: Gait belt;Rolling walker Nurse Communication: Mobility status  Activity Tolerance: Patient tolerated treatment well Patient left: in bed;Other (comment)(going to MRI)  OT Visit Diagnosis: Unsteadiness on feet (R26.81);Other abnormalities of gait and mobility (R26.89);Muscle weakness (generalized) (M62.81);Ataxia, unspecified (R27.0)                Time: 5465-6812 OT Time Calculation (min): 22 min Charges:  OT General Charges $OT Visit: 1 Visit OT Evaluation $OT Eval Moderate Complexity: 1 Mod  Hulda Humphrey OTR/L [place] 03/20/2018, 6:02 PM

## 2018-03-20 NOTE — Consult Note (Signed)
Requesting Physician: Dr. Hal Hope    Chief Complaint: Left side weakness.   History obtained from: Patient and Chart    HPI:                                                                                                                                       [name] is an 62 y.o. female past medical history of ICH, seizures, DVT chronic back pain, COPD presents to the emergency room for left leg weakness, drowsiness in the morning of 8/16.  Patient states that she was going fishing with her boyfriend and while stepping out of the truck she suddenly could not move her left leg and almost fell.  Her friend helped her back in the truck and patient was taken back home.  Throughout the day she felt weak.  She tried to get up from the porch and her left leg was weak again.  Continues to have left-sided weakness.  Work-up in the ER revealed her Dilantin level was 33.8.  Patient was admitted for observation.  MRI brain was performed to rule out acute stroke.  MRI C-spine was also ordered which showed severe right C6 foraminal stenosis and degenerative spondylosis.     Past Medical History:  Diagnosis Date  . Asthma   . Cerebral hemorrhage ([place]) 1989  . Chronic back pain   . COPD (chronic obstructive pulmonary disease) ([place])   . DDD (degenerative disc disease) 06/12/2013  . DVT (deep venous thrombosis) ([place])    "she's had several since 1990"  . GERD (gastroesophageal reflux disease)   . Headache    "usually around time when she's had a seizure"  . History of blood transfusion    "when she was a baby"  . History of stomach ulcers   . Kidney stones   . Seizures ([place])    "because of her brain surgery"  . Stroke [facility]) [phone]   family denies residual on 11/09/2014  . Tunnel vision    "since brain OR"    Past Surgical History:  Procedure Laterality Date  . [place]   craniotomy with hematoma evacuation  . CESAREAN SECTION  1979; 1987; 1989  . HEMORRHOID SURGERY    .  TUBAL LIGATION  1990's    Family History  Problem Relation Age of Onset  . Heart attack Mother        4 MIs, started in her 41s, also vaginal cancer and colon cancer  . Cancer Mother   . Heart attack Father        Died suddenly age 52 - autopsy revealed heart attack her patient  . Cancer Brother        Sinus cancer   Social History:  reports that she quit smoking about 4 months ago. Her smoking use included cigarettes. She has a 10.00 pack-year smoking history. She has never used smokeless tobacco. She reports that  she does not drink alcohol or use drugs.  Allergies:  Allergies  Allergen Reactions  . Zonisamide Other (See Comments)    Numbness and tingling over whole body  . Codeine Nausea And Vomiting  . Keppra [Levetiracetam] Other (See Comments)    Causes seizures.   . Pregabalin Nausea And Vomiting  . Olive Oil Rash    Medications:                                                                                                                        I reviewed home medications   ROS:                                                                                                                                     14 systems reviewed and negative except above    Examination:                                                                                                      General: Appears well-developed and well-nourished.  Psych: Affect appropriate to situation Eyes: No scleral injection HENT: No OP obstrucion Head: Normocephalic.  Cardiovascular: Normal rate and regular rhythm.  Respiratory: Effort normal and breath sounds normal to anterior ascultation GI: Soft.  No distension. There is no tenderness.  Skin: WDI    Neurological Examination Mental Status: Alert, oriented, thought content appropriate.  Speech fluent without evidence of aphasia. Able to follow 3 step commands without difficulty. Cranial Nerves: II: Visual fields grossly normal,  III,IV, VI:  ptosis not present, extra-ocular motions intact bilaterally, pupils equal, round, reactive to light and accommodation V,VII: smile symmetric, mild left NFF VIII: hearing normal bilaterally IX,X: uvula rises symmetrically XI: bilateral shoulder shrug XII: midline tongue extension Motor: Right : Upper extremity   5/5    Left:     Upper extremity   5/5  Lower extremity   5/5     Lower extremity   4/5 Tone and bulk:normal tone  throughout; no atrophy noted Sensory: Pinprick and light touch intact throughout, bilaterally Deep Tendon Reflexes: 2+ and symmetric throughout Plantars: Right: downgoing   Left: downgoing Cerebellar: Mild dysmetria on finger to nose testing bilateraly Gait: normal gait and station     Lab Results: Basic Metabolic Panel: Recent Labs  Lab 03/19/18 1752  NA 142  K 3.6  CL 109  CO2 26  GLUCOSE 88  BUN 5*  CREATININE 0.65  CALCIUM 8.8*    CBC: Recent Labs  Lab 03/19/18 1752  WBC 3.5*  HGB 11.9*  HCT 37.4  MCV 103.3*  PLT 135*    Coagulation Studies: No results for input(s): LABPROT, INR in the last 72 hours.  Imaging: Dg Chest 2 View  Result Date: 03/19/2018 CLINICAL DATA:  Seizures EXAM: CHEST - 2 VIEW COMPARISON:  August 05, 2017 FINDINGS: There is focal airspace consolidation with volume loss in a portion of the right middle lobe. Lungs elsewhere clear. Heart size and pulmonary vascularity are normal. No adenopathy. There is aortic atherosclerosis. No pneumothorax. There is evidence of an old healed fracture of the lateral right clavicle. No acute fracture evident. IMPRESSION: Focal airspace consolidation with volume loss in right middle lobe. Lungs elsewhere clear. Stable cardiac silhouette. Aortic atherosclerosis. No pneumothorax. Old fracture lateral right clavicle. Aortic Atherosclerosis (ICD10-I70.0). Followup PA and lateral chest radiographs recommended in 3-4 weeks following trial of antibiotic therapy to ensure resolution and exclude  underlying malignancy. It should be noted an obstructing lesion in a segmental right middle lobe bronchus could cause the current appearance. Electronically Signed   By: Lowella Grip III M.D.   On: 03/19/2018 19:51   Ct Head Wo Contrast  Result Date: 03/19/2018 CLINICAL DATA:  5 seizures today. History of seizures. Transient left-sided numbness following the seizures. EXAM: CT HEAD WITHOUT CONTRAST TECHNIQUE: Contiguous axial images were obtained from the base of the skull through the vertex without intravenous contrast. COMPARISON:  Brain MR dated 12/19/2017. Head CT dated 12/19/2017. 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 Stable mildly enlarged ventricles and cortical sulci. No intracranial hemorrhage, mass lesion or CT evidence of acute infarction. Vascular: No hyperdense vessel or unexpected calcification. Skull: Stable right posterior craniotomy changes. Sinuses/Orbits: Unremarkable. Other: None. IMPRESSION: 1. No acute abnormality. 2. 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 3. Stable mild diffuse cerebral and cerebellar atrophy. Electronically Signed   By: Claudie Revering M.D.   On: 03/19/2018 20:22   Mr Brain Wo Contrast  Result Date: 03/20/2018 CLINICAL DATA:  Initial evaluation for acute left-sided numbness. EXAM: MRI HEAD WITHOUT CONTRAST MRI CERVICAL SPINE WITHOUT CONTRAST TECHNIQUE: Multiplanar, multiecho pulse sequences of the brain and surrounding structures, and cervical spine, to include the craniocervical junction and cervicothoracic junction, were obtained without intravenous contrast. COMPARISON:  Prior CT from 03/19/2018 as well as previous MRI from 12/19/2017. FINDINGS: MRI HEAD FINDINGS Brain: Cerebral volume within normal limits and stable. Large volume right parieto-occipital encephalomalacia, unchanged. No acute intracranial infarct. No acute or  chronic intracranial hemorrhage. No mass lesion, midline shift or mass effect. No hydrocephalus. Ex vacuo dilatation of the right lateral ventricle related to the right cerebral encephalomalacia. No extra-axial fluid collection. Normal pituitary gland. Vascular: Major intracranial vascular flow voids are maintained. Skull and upper cervical spine: Craniocervical junction normal. Prior right parietooccipital craniectomy with cranioplasty. No scalp soft tissue abnormality. Bone marrow signal intensity normal. Sinuses/Orbits: Globes and orbital soft tissues  within normal limits. Paranasal sinuses are clear. Small left mastoid effusion, of doubtful significance. Other: None. MRI CERVICAL SPINE FINDINGS Alignment: Vertebral bodies normally aligned with preservation of the normal cervical lordosis. No listhesis. Vertebrae: Vertebral body height maintained. No acute or chronic fracture. Bone marrow signal intensity mildly heterogeneous but within normal limits. Reactive endplate changes about the C5-6 and C6-7 interspaces. No worrisome osseous lesions. Cord: Signal intensity within the cervical spinal cord is normal Posterior Fossa, vertebral arteries, paraspinal tissues: Paraspinous soft tissues within normal limits. Normal intravascular flow voids present within the vertebral arteries bilaterally. Disc levels: C2-C3: Unremarkable. C3-C4: Mild diffuse disc bulge with uncovertebral hypertrophy. Mild left-sided facet hypertrophy. No stenosis. C4-C5: Mild disc bulge. Prominent left-sided facet degeneration. Resultant moderate left C5 foraminal stenosis. No canal or right foraminal narrowing. C5-C6: Chronic diffuse degenerative disc osteophyte, asymmetric to the right. Broad posterior component flattens the ventral CSF. Mild spinal stenosis without cord deformity. Severe right C6 foraminal stenosis. C6-C7: Diffuse circumferential disc osteophyte. Flattening of the ventral CSF with mild spinal stenosis. Foramina remain patent.  C7-T1:  Unremarkable. Visualized upper thoracic spine within normal limits. IMPRESSION: MRI HEAD IMPRESSION: 1. No acute intracranial abnormality. 2. Chronic right parieto-occipital encephalomalacia, stable. MRI CERVICAL SPINE IMPRESSION: 1. No acute abnormality within the cervical spine. 2. Prominent left-sided facet hypertrophy at C4-5 with resultant moderate left C5 foraminal stenosis. 3. Degenerative disc osteophyte at C5-6 with resultant severe right C6 foraminal stenosis. 4. Degenerative spondylolysis at C5-6 and C6-7 with resultant mild spinal stenosis. Electronically Signed   By: Jeannine Boga M.D.   On: 03/20/2018 05:27   Mr Cervical Spine Wo Contrast  Result Date: 03/20/2018 CLINICAL DATA:  Initial evaluation for acute left-sided numbness. EXAM: MRI HEAD WITHOUT CONTRAST MRI CERVICAL SPINE WITHOUT CONTRAST TECHNIQUE: Multiplanar, multiecho pulse sequences of the brain and surrounding structures, and cervical spine, to include the craniocervical junction and cervicothoracic junction, were obtained without intravenous contrast. COMPARISON:  Prior CT from 03/19/2018 as well as previous MRI from 12/19/2017. FINDINGS: MRI HEAD FINDINGS Brain: Cerebral volume within normal limits and stable. Large volume right parieto-occipital encephalomalacia, unchanged. No acute intracranial infarct. No acute or chronic intracranial hemorrhage. No mass lesion, midline shift or mass effect. No hydrocephalus. Ex vacuo dilatation of the right lateral ventricle related to the right cerebral encephalomalacia. No extra-axial fluid collection. Normal pituitary gland. Vascular: Major intracranial vascular flow voids are maintained. Skull and upper cervical spine: Craniocervical junction normal. Prior right parietooccipital craniectomy with cranioplasty. No scalp soft tissue abnormality. Bone marrow signal intensity normal. Sinuses/Orbits: Globes and orbital soft tissues within normal limits. Paranasal sinuses are clear.  Small left mastoid effusion, of doubtful significance. Other: None. MRI CERVICAL SPINE FINDINGS Alignment: Vertebral bodies normally aligned with preservation of the normal cervical lordosis. No listhesis. Vertebrae: Vertebral body height maintained. No acute or chronic fracture. Bone marrow signal intensity mildly heterogeneous but within normal limits. Reactive endplate changes about the C5-6 and C6-7 interspaces. No worrisome osseous lesions. Cord: Signal intensity within the cervical spinal cord is normal Posterior Fossa, vertebral arteries, paraspinal tissues: Paraspinous soft tissues within normal limits. Normal intravascular flow voids present within the vertebral arteries bilaterally. Disc levels: C2-C3: Unremarkable. C3-C4: Mild diffuse disc bulge with uncovertebral hypertrophy. Mild left-sided facet hypertrophy. No stenosis. C4-C5: Mild disc bulge. Prominent left-sided facet degeneration. Resultant moderate left C5 foraminal stenosis. No canal or right foraminal narrowing. C5-C6: Chronic diffuse degenerative disc osteophyte, asymmetric to the right. Broad posterior component flattens the ventral CSF. Mild spinal stenosis without cord  deformity. Severe right C6 foraminal stenosis. C6-C7: Diffuse circumferential disc osteophyte. Flattening of the ventral CSF with mild spinal stenosis. Foramina remain patent. C7-T1:  Unremarkable. Visualized upper thoracic spine within normal limits. IMPRESSION: MRI HEAD IMPRESSION: 1. No acute intracranial abnormality. 2. Chronic right parieto-occipital encephalomalacia, stable. MRI CERVICAL SPINE IMPRESSION: 1. No acute abnormality within the cervical spine. 2. Prominent left-sided facet hypertrophy at C4-5 with resultant moderate left C5 foraminal stenosis. 3. Degenerative disc osteophyte at C5-6 with resultant severe right C6 foraminal stenosis. 4. Degenerative spondylolysis at C5-6 and C6-7 with resultant mild spinal stenosis. Electronically Signed   By: Jeannine Boga M.D.   On: 03/20/2018 05:27     I have reviewed the above imaging : CT  head showed chronic right encephalomalacia MRI Brain and MRI C spine - reviewed. Nothing acute to explain patients isolated left leg weakess   ASSESSMENT AND PLAN 62 y.o. female past medical history of ICH, seizures, DVT chronic back pain, COPD presents to the emergency room for left leg weakness, drowsiness in the morning of 8/16. Patient feeling weak and unsteady likely from Dilantin toxicity.  Patient denies taking additional doses, denies taking any new medications.  Phenytoin toxicity -Hold Dilantin dose until tomorrow -Repeat level tomorrow morning -Vimpat  Left leg weakness no findings on MRI brain and C-spine to explain weakness - ? Radiculopathy vs post ictal Todd's     Sushanth Aroor [facility] Pager Number [phone]

## 2018-03-20 NOTE — Evaluation (Signed)
Physical Therapy Evaluation Patient Details Name: [name] MRN: [id number] DOB: September 27, 1955 Today's Date: 03/20/2018   History of Present Illness  [name] is an 62 y.o. female past medical history of ICH, seizures, DVT chronic back pain, COPD presents to the emergency room for left leg weakness, drowsiness in the morning of 8/16.  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   Clinical Impression  Pt admitted with above diagnosis. Pt currently with functional limitations due to the deficits listed below (see PT Problem List). Pt presents with inability to ambulate without AD and need for mod A even when ambulating with RW due to poor neuromuscular control of LLE.Ataxic gait pattern with poor L knee control.  She also reports pain in LUE and LLE that began yesterday.Pt has second level bedroom, would not be able to get to that at this point.  Pt will benefit from skilled PT to increase their independence and safety with mobility to allow discharge to the venue listed below.       Follow Up Recommendations CIR;Supervision for mobility/OOB    Equipment Recommendations  Rolling walker with 5" wheels    Recommendations for Other Services Rehab consult;OT consult     Precautions / Restrictions Precautions Precautions: Fall Restrictions Weight Bearing Restrictions: No      Mobility  Bed Mobility Overal bed mobility: Needs Assistance Bed Mobility: Rolling;Sidelying to Sit Rolling: Supervision Sidelying to sit: Min assist       General bed mobility comments: pt able to roll to R but difficulty getting BLE's off bed, min A given   Transfers Overall transfer level: Needs assistance Equipment used: Rolling walker (2 wheeled) Transfers: Sit to/from Stand Sit to Stand: Min assist         General transfer comment: unable to stand without UE support  Ambulation/Gait Ambulation/Gait assistance:  Mod assist Gait Distance (Feet): 20 Feet Assistive device: Rolling walker (2 wheeled) Gait Pattern/deviations: Ataxic;Decreased weight shift to left;Decreased stance time - left;Decreased step length - right;Staggering left Gait velocity: decreased Gait velocity interpretation: <1.31 ft/sec, indicative of household ambulator General Gait Details: pt with increased wt through UE's and mild buckling of L knee as well as difficulty turning RW  Science writer    Modified Rankin (Stroke Patients Only)       Balance Overall balance assessment: Needs assistance Sitting-balance support: Single extremity supported Sitting balance-Leahy Scale: Fair     Standing balance support: Bilateral upper extremity supported Standing balance-Leahy Scale: Poor Standing balance comment: dependent on UE support                             Pertinent Vitals/Pain Pain Assessment: Faces Faces Pain Scale: Hurts even more Pain Location: pain LUE and LLE Pain Descriptors / Indicators: Aching Pain Intervention(s): Limited activity within patient's tolerance;Monitored during session    Home Living Family/patient expects to be discharged to:: Private residence Living Arrangements: Children Available Help at Discharge: Family;Available PRN/intermittently Type of Home: House Home Access: Level entry     Home Layout: Two level Home Equipment: None Additional Comments: she lives with her daughter and grandchildren    Prior Function Level of Independence: Needs assistance   Gait / Transfers Assistance Needed: independent, no AD  ADL's / Homemaking Assistance Needed: does not drive  Hand Dominance   Dominant Hand: Right    Extremity/Trunk Assessment   Upper Extremity Assessment Upper Extremity Assessment: Defer to OT evaluation LUE Sensation: (pt reports tingling)    Lower Extremity Assessment Lower Extremity Assessment: LLE  deficits/detail LLE Deficits / Details: hip flex 3+/5, knee ext 3+/5 but has a hard time maintaining contraction. pt reports pain from hip down to foot. Has difficuly placing foot properly on floor to stand. In standing pt feel has sensation that LLE is hanging as dead weight.   LLE Sensation: decreased light touch(numbness)    Cervical / Trunk Assessment Cervical / Trunk Assessment: Normal  Communication   Communication: No difficulties  Cognition Arousal/Alertness: Awake/alert Behavior During Therapy: WFL for tasks assessed/performed Overall Cognitive Status: Within Functional Limits for tasks assessed                                 General Comments: pt with great concern about what's going on and she reports she has been doing well, [place] and was getting ready to fish with boyfriend when event happened yesterday      General Comments General comments (skin integrity, edema, etc.): pt reports that she had several seizures yesterday which is not her normal (being about 1/ month)     Exercises     Assessment/Plan    PT Assessment Patient needs continued PT services  PT Problem List Decreased strength;Decreased activity tolerance;Decreased balance;Decreased mobility;Decreased coordination;Decreased knowledge of use of DME;Decreased knowledge of precautions;Pain;Impaired sensation;Impaired tone       PT Treatment Interventions DME instruction;Gait training;Stair training;Functional mobility training;Therapeutic activities;Therapeutic exercise;Balance training;Patient/family education    PT Goals (Current goals can be found in the Care Plan section)  Acute Rehab PT Goals Patient Stated Goal: get better PT Goal Formulation: With patient Time For Goal Achievement: 04/03/18 Potential to Achieve Goals: Good    Frequency Min 3X/week   Barriers to discharge Inaccessible home environment bedroom upstairs    Co-evaluation                AM-PAC PT "6 Clicks" Daily Activity  Outcome Measure Difficulty turning over in bed (including adjusting bedclothes, sheets and blankets)?: A Little Difficulty moving from lying on back to sitting on the side of the bed? : Unable Difficulty sitting down on and standing up from a chair with arms (e.g., wheelchair, bedside commode, etc,.)?: Unable Help needed moving to and from a bed to chair (including a wheelchair)?: A Little Help needed walking in hospital room?: A Lot Help needed climbing 3-5 steps with a railing? : Total 6 Click Score: 11    End of Session Equipment Utilized During Treatment: Gait belt Activity Tolerance: Patient tolerated treatment well Patient left: in bed;with call bell/phone within reach;with bed alarm set Nurse Communication: Mobility status PT Visit Diagnosis: Other abnormalities of gait and mobility (R26.89);Ataxic gait (R26.0);Unsteadiness on feet (R26.81);Pain Pain - Right/Left: Left Pain - part of body: Leg    Time: 8841-6606 PT Time Calculation (min) (ACUTE ONLY): 42 min   Charges:   PT Evaluation $PT Eval Moderate Complexity: 1 Mod PT Treatments $Gait Training: 8-22 mins $Therapeutic Activity: 8-22 mins        [facility]  [place] 03/20/2018, 4:57 PM

## 2018-03-21 ENCOUNTER — Encounter (HOSPITAL_COMMUNITY): Payer: Self-pay

## 2018-03-21 DIAGNOSIS — M545 Low back pain: Secondary | ICD-10-CM | POA: Diagnosis present

## 2018-03-21 DIAGNOSIS — Z86718 Personal history of other venous thrombosis and embolism: Secondary | ICD-10-CM | POA: Diagnosis not present

## 2018-03-21 DIAGNOSIS — R531 Weakness: Secondary | ICD-10-CM | POA: Diagnosis present

## 2018-03-21 DIAGNOSIS — Z8673 Personal history of transient ischemic attack (TIA), and cerebral infarction without residual deficits: Secondary | ICD-10-CM | POA: Diagnosis not present

## 2018-03-21 DIAGNOSIS — J45909 Unspecified asthma, uncomplicated: Secondary | ICD-10-CM | POA: Diagnosis not present

## 2018-03-21 DIAGNOSIS — T420X1A Poisoning by hydantoin derivatives, accidental (unintentional), initial encounter: Secondary | ICD-10-CM | POA: Diagnosis not present

## 2018-03-21 DIAGNOSIS — D696 Thrombocytopenia, unspecified: Secondary | ICD-10-CM | POA: Diagnosis present

## 2018-03-21 DIAGNOSIS — T420X5A Adverse effect of hydantoin derivatives, initial encounter: Secondary | ICD-10-CM | POA: Diagnosis present

## 2018-03-21 DIAGNOSIS — I951 Orthostatic hypotension: Secondary | ICD-10-CM | POA: Diagnosis not present

## 2018-03-21 DIAGNOSIS — G40909 Epilepsy, unspecified, not intractable, without status epilepticus: Secondary | ICD-10-CM | POA: Diagnosis present

## 2018-03-21 DIAGNOSIS — J449 Chronic obstructive pulmonary disease, unspecified: Secondary | ICD-10-CM | POA: Diagnosis present

## 2018-03-21 DIAGNOSIS — I629 Nontraumatic intracranial hemorrhage, unspecified: Secondary | ICD-10-CM

## 2018-03-21 DIAGNOSIS — K219 Gastro-esophageal reflux disease without esophagitis: Secondary | ICD-10-CM | POA: Diagnosis present

## 2018-03-21 DIAGNOSIS — R627 Adult failure to thrive: Secondary | ICD-10-CM | POA: Diagnosis not present

## 2018-03-21 DIAGNOSIS — G8929 Other chronic pain: Secondary | ICD-10-CM | POA: Diagnosis present

## 2018-03-21 DIAGNOSIS — T420X4A Poisoning by hydantoin derivatives, undetermined, initial encounter: Secondary | ICD-10-CM | POA: Diagnosis not present

## 2018-03-21 DIAGNOSIS — M79609 Pain in unspecified limb: Secondary | ICD-10-CM | POA: Diagnosis not present

## 2018-03-21 DIAGNOSIS — Z720 Tobacco use: Secondary | ICD-10-CM | POA: Diagnosis not present

## 2018-03-21 DIAGNOSIS — R200 Anesthesia of skin: Secondary | ICD-10-CM | POA: Diagnosis present

## 2018-03-21 DIAGNOSIS — Z79899 Other long term (current) drug therapy: Secondary | ICD-10-CM | POA: Diagnosis not present

## 2018-03-21 DIAGNOSIS — I959 Hypotension, unspecified: Secondary | ICD-10-CM | POA: Diagnosis not present

## 2018-03-21 DIAGNOSIS — M48061 Spinal stenosis, lumbar region without neurogenic claudication: Secondary | ICD-10-CM | POA: Diagnosis present

## 2018-03-21 DIAGNOSIS — R270 Ataxia, unspecified: Secondary | ICD-10-CM | POA: Diagnosis present

## 2018-03-21 DIAGNOSIS — R51 Headache: Secondary | ICD-10-CM | POA: Diagnosis present

## 2018-03-21 DIAGNOSIS — Z87891 Personal history of nicotine dependence: Secondary | ICD-10-CM | POA: Diagnosis not present

## 2018-03-21 LAB — TROPONIN I: Troponin I: <0.03 ng/mL (ref <0.03)

## 2018-03-21 LAB — PHENYTOIN LEVEL, TOTAL: Phenytoin Lvl: 21.5 ug/mL — ABNORMAL HIGH (ref 10.0–20.0)

## 2018-03-21 MED ORDER — HYDROXYZINE HCL 10 MG PO TABS
10.0000 mg | ORAL_TABLET | Freq: Three times a day (TID) | ORAL | Status: DC | PRN
  Filled 2018-03-21: qty 1

## 2018-03-21 MED ORDER — GABAPENTIN 100 MG PO CAPS
100.0000 mg | ORAL_CAPSULE | Freq: Every morning | ORAL | Status: DC
  Administered 2018-03-21 – 2018-03-25 (×5): 100 mg via ORAL
  Filled 2018-03-21 (×5): qty 1

## 2018-03-21 MED ORDER — GABAPENTIN 300 MG PO CAPS
300.0000 mg | ORAL_CAPSULE | Freq: Every day | ORAL | Status: DC
  Administered 2018-03-21 – 2018-03-24 (×4): 300 mg via ORAL
  Filled 2018-03-21 (×5): qty 1

## 2018-03-21 MED ORDER — KETOROLAC TROMETHAMINE 30 MG/ML IJ SOLN
30.0000 mg | Freq: Four times a day (QID) | INTRAMUSCULAR | Status: DC | PRN
  Administered 2018-03-21 – 2018-03-25 (×6): 30 mg via INTRAVENOUS
  Filled 2018-03-21 (×6): qty 1

## 2018-03-21 MED ORDER — LACTATED RINGERS IV BOLUS
250.0000 mL | Freq: Once | INTRAVENOUS | Status: AC
  Administered 2018-03-21: 250 mL via INTRAVENOUS

## 2018-03-21 MED ORDER — PANTOPRAZOLE SODIUM 40 MG PO TBEC
40.0000 mg | DELAYED_RELEASE_TABLET | Freq: Every day | ORAL | Status: DC
  Administered 2018-03-21 – 2018-03-25 (×5): 40 mg via ORAL
  Filled 2018-03-21 (×5): qty 1

## 2018-03-21 MED ORDER — POLYETHYLENE GLYCOL 3350 17 G PO PACK
17.0000 g | PACK | Freq: Every day | ORAL | Status: DC
  Administered 2018-03-21 – 2018-03-25 (×5): 17 g via ORAL
  Filled 2018-03-21 (×5): qty 1

## 2018-03-21 NOTE — Progress Notes (Signed)
PROGRESS NOTE    HETAL PROANO  HWE:[id number] DOB: 1956/06/12 DOA: 03/19/2018 PCP: [name], MD    Brief Narrative:  62 y.o. female with history of seizures and asthma has been experiencing difficulty walking last 2 days with ataxia.  Also has been having numbness running from the neck down towards the left side of her body.  Denies any fall.  Denies any seizures.  ED Course: In the ER patient is a Dilaudid level is found to be elevated at around 33.8 which could explain patient's symptoms but since patient has had left-sided numbness neurologist on-call has been consulted who requested MRI brain to rule out any stroke.  Exam patient is moving all extremities with mild weakness of the left side.  Assessment & Plan:   Principal Problem:   Dilantin toxicity Active Problems:   Seizure disorder ([place])   History of Intracranial bleed   Left-sided weakness  1. Dilantin toxicity with ataxia 1. Possible ataxia from Dilantin toxicity.   2. Dilantin remains on hold, levels improving although still in the supertherapeutic range 3. Discussed with Neurology. Continue to hold dilantin. Will need to continue to monitor closely on tele given concerns of cardiotoxicity (see below) 4. Level remains supertherapeutic 5. MRI spine reviewed, no acute process noted 6. PT recommends CIR, consult requested 2. History of seizures on Vimpat and Dilantin.   1. Vimpat has been continued 2. Dilantin continue to be on hold per above 3. No further seizures thus far 3. History of asthma 1. Currently on minimal O2 support 2. Without wheezing at this time 4. Previous history of intracranial bleed. 1. Remains stable at present. Head CT personally reviewed with Neurologist this AM. Old changes noted 5. History of DVT. 1. Stable currently 2. LE dopplers ordered by Neurology, pending, to r/o DVT 6. Hypotension 1. This afternoon, bp noted to be 88/52 with associated chest pain (see below) 2. Will order 250cc  LR bolus 3. Repeat bmet in AM 4. Continue on tele 7. Chest pain 1. EKG ordered and reviewed. QTc of 439. No ischemic changes apparent 2. Have ordered troponin x 1, pending 3. Have ordered trial of toradol as well as protonix 4. Giving LR bolus per above 5. Continue on tele given concerns of cardiotoxicity of dilantin 8. LE numbness 1. Discussed with Neurology 2. Recommendations for trial of scheduled neuronin, ordered  DVT prophylaxis: SCD's Code Status: Full Family Communication: Pt in room, family not at bedside Disposition Plan: Uncertain at this time  Consultants:   Neurology  Procedures:    Antimicrobials: Anti-infectives (From admission, onward)   None      Subjective: Complaining of chest pain this afternoon as well as LE leg numbness  Objective: Vitals:   03/21/18 0353 03/21/18 0913 03/21/18 1226 03/21/18 1435  BP: (!) 109/52 (!) 91/51 (!) 102/53 (!) 88/52  Pulse: (!) 45 71 66 67  Resp: 18 18 18    Temp: 98 F (36.7 C) (!) 97.5 F (36.4 C) 97.9 F (36.6 C)   TempSrc: Oral Oral Oral   SpO2: 95% 90% 95% 95%  Weight:      Height:        Intake/Output Summary (Last 24 hours) at 03/21/2018 1438 Last data filed at 03/20/2018 1500 Gross per 24 hour  Intake 299.97 ml  Output -  Net 299.97 ml   Filed Weights   03/19/18 1627 03/19/18 2210  Weight: 59 kg 59.2 kg    Examination: General exam: Conversant, in no acute distress Respiratory system:  normal chest rise, clear, no audible wheezing Cardiovascular system: regular rhythm, s1-s2 Gastrointestinal system: Nondistended, nontender, pos BS Central nervous system: No seizures, no tremors Extremities: No cyanosis, no joint deformities Skin: No rashes, no pallor Psychiatry: Affect normal // no auditory hallucinations   Data Reviewed: I have personally reviewed following labs and imaging studies  CBC: Recent Labs  Lab 03/19/18 1752  WBC 3.5*  HGB 11.9*  HCT 37.4  MCV 103.3*  PLT 135*   Basic  Metabolic Panel: Recent Labs  Lab 03/19/18 1752 03/20/18 0535  NA 142 144  K 3.6 3.6  CL 109 110  CO2 26 27  GLUCOSE 88 83  BUN 5* 6  CREATININE 0.65 0.77  CALCIUM 8.8* 8.6*   GFR: Estimated Creatinine Clearance: 69.9 mL/min (by C-G formula based on SCr of 0.77 mg/dL). Liver Function Tests: Recent Labs  Lab 03/20/18 0535  AST 16  ALT 11  ALKPHOS 48  BILITOT 0.7  PROT 5.5*  ALBUMIN 3.3*   No results for input(s): LIPASE, AMYLASE in the last 168 hours. No results for input(s): AMMONIA in the last 168 hours. Coagulation Profile: No results for input(s): INR, PROTIME in the last 168 hours. Cardiac Enzymes: No results for input(s): CKTOTAL, CKMB, CKMBINDEX, TROPONINI in the last 168 hours. BNP (last 3 results) No results for input(s): PROBNP in the last 8760 hours. HbA1C: No results for input(s): HGBA1C in the last 72 hours. CBG: Recent Labs  Lab 03/19/18 1746  GLUCAP 87   Lipid Profile: No results for input(s): CHOL, HDL, LDLCALC, TRIG, CHOLHDL, LDLDIRECT in the last 72 hours. Thyroid Function Tests: No results for input(s): TSH, T4TOTAL, FREET4, T3FREE, THYROIDAB in the last 72 hours. Anemia Panel: Recent Labs    03/20/18 0810  VITAMINB12 168*  FOLATE 7.0  FERRITIN 35  TIBC 242*  IRON 154  RETICCTPCT 1.2   Sepsis Labs: No results for input(s): PROCALCITON, LATICACIDVEN in the last 168 hours.  Recent Results (from the past 240 hour(s))  Urine culture     Status: Abnormal   Collection Time: 03/19/18  6:48 PM  Result Value Ref Range Status   Specimen Description URINE, CLEAN CATCH  Final   Special Requests   Final    NONE Performed at [facility], [address], [place], [place] [address]    Culture MULTIPLE SPECIES PRESENT, SUGGEST RECOLLECTION (A)  Final   Report Status 03/20/2018 FINAL  Final     Radiology Studies: Dg Chest 2 View  Result Date: 03/19/2018 CLINICAL DATA:  Seizures EXAM: CHEST - 2 VIEW COMPARISON:  August 05, 2017  FINDINGS: There is focal airspace consolidation with volume loss in a portion of the right middle lobe. Lungs elsewhere clear. Heart size and pulmonary vascularity are normal. No adenopathy. There is aortic atherosclerosis. No pneumothorax. There is evidence of an old healed fracture of the lateral right clavicle. No acute fracture evident. IMPRESSION: Focal airspace consolidation with volume loss in right middle lobe. Lungs elsewhere clear. Stable cardiac silhouette. Aortic atherosclerosis. No pneumothorax. Old fracture lateral right clavicle. Aortic Atherosclerosis (ICD10-I70.0). Followup PA and lateral chest radiographs recommended in 3-4 weeks following trial of antibiotic therapy to ensure resolution and exclude underlying malignancy. It should be noted an obstructing lesion in a segmental right middle lobe bronchus could cause the current appearance. Electronically Signed   By: Lowella Grip III M.D.   On: 03/19/2018 19:51   Ct Head Wo Contrast  Result Date: 03/19/2018 CLINICAL DATA:  5 seizures today. History  of seizures. Transient left-sided numbness following the seizures. EXAM: CT HEAD WITHOUT CONTRAST TECHNIQUE: Contiguous axial images were obtained from the base of the skull through the vertex without intravenous contrast. COMPARISON:  Brain MR dated 12/19/2017. Head CT dated 12/19/2017. 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 Stable mildly enlarged ventricles and cortical sulci. No intracranial hemorrhage, mass lesion or CT evidence of acute infarction. Vascular: No hyperdense vessel or unexpected calcification. Skull: Stable right posterior craniotomy changes. Sinuses/Orbits: Unremarkable. Other: None. IMPRESSION: 1. No acute abnormality. 2. 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 3. Stable mild diffuse cerebral and cerebellar atrophy. Electronically Signed   By:  Claudie Revering M.D.   On: 03/19/2018 20:22   Mr Brain Wo Contrast  Result Date: 03/20/2018 CLINICAL DATA:  Initial evaluation for acute left-sided numbness. EXAM: MRI HEAD WITHOUT CONTRAST MRI CERVICAL SPINE WITHOUT CONTRAST TECHNIQUE: Multiplanar, multiecho pulse sequences of the brain and surrounding structures, and cervical spine, to include the craniocervical junction and cervicothoracic junction, were obtained without intravenous contrast. COMPARISON:  Prior CT from 03/19/2018 as well as previous MRI from 12/19/2017. FINDINGS: MRI HEAD FINDINGS Brain: Cerebral volume within normal limits and stable. Large volume right parieto-occipital encephalomalacia, unchanged. No acute intracranial infarct. No acute or chronic intracranial hemorrhage. No mass lesion, midline shift or mass effect. No hydrocephalus. Ex vacuo dilatation of the right lateral ventricle related to the right cerebral encephalomalacia. No extra-axial fluid collection. Normal pituitary gland. Vascular: Major intracranial vascular flow voids are maintained. Skull and upper cervical spine: Craniocervical junction normal. Prior right parietooccipital craniectomy with cranioplasty. No scalp soft tissue abnormality. Bone marrow signal intensity normal. Sinuses/Orbits: Globes and orbital soft tissues within normal limits. Paranasal sinuses are clear. Small left mastoid effusion, of doubtful significance. Other: None. MRI CERVICAL SPINE FINDINGS Alignment: Vertebral bodies normally aligned with preservation of the normal cervical lordosis. No listhesis. Vertebrae: Vertebral body height maintained. No acute or chronic fracture. Bone marrow signal intensity mildly heterogeneous but within normal limits. Reactive endplate changes about the C5-6 and C6-7 interspaces. No worrisome osseous lesions. Cord: Signal intensity within the cervical spinal cord is normal Posterior Fossa, vertebral arteries, paraspinal tissues: Paraspinous soft tissues within normal  limits. Normal intravascular flow voids present within the vertebral arteries bilaterally. Disc levels: C2-C3: Unremarkable. C3-C4: Mild diffuse disc bulge with uncovertebral hypertrophy. Mild left-sided facet hypertrophy. No stenosis. C4-C5: Mild disc bulge. Prominent left-sided facet degeneration. Resultant moderate left C5 foraminal stenosis. No canal or right foraminal narrowing. C5-C6: Chronic diffuse degenerative disc osteophyte, asymmetric to the right. Broad posterior component flattens the ventral CSF. Mild spinal stenosis without cord deformity. Severe right C6 foraminal stenosis. C6-C7: Diffuse circumferential disc osteophyte. Flattening of the ventral CSF with mild spinal stenosis. Foramina remain patent. C7-T1:  Unremarkable. Visualized upper thoracic spine within normal limits. IMPRESSION: MRI HEAD IMPRESSION: 1. No acute intracranial abnormality. 2. Chronic right parieto-occipital encephalomalacia, stable. MRI CERVICAL SPINE IMPRESSION: 1. No acute abnormality within the cervical spine. 2. Prominent left-sided facet hypertrophy at C4-5 with resultant moderate left C5 foraminal stenosis. 3. Degenerative disc osteophyte at C5-6 with resultant severe right C6 foraminal stenosis. 4. Degenerative spondylolysis at C5-6 and C6-7 with resultant mild spinal stenosis. Electronically Signed   By: Jeannine Boga M.D.   On: 03/20/2018 05:27   Mr Cervical Spine Wo Contrast  Result Date: 03/20/2018 CLINICAL DATA:  Initial evaluation for acute left-sided numbness. EXAM: MRI HEAD WITHOUT CONTRAST MRI  CERVICAL SPINE WITHOUT CONTRAST TECHNIQUE: Multiplanar, multiecho pulse sequences of the brain and surrounding structures, and cervical spine, to include the craniocervical junction and cervicothoracic junction, were obtained without intravenous contrast. COMPARISON:  Prior CT from 03/19/2018 as well as previous MRI from 12/19/2017. FINDINGS: MRI HEAD FINDINGS Brain: Cerebral volume within normal limits and  stable. Large volume right parieto-occipital encephalomalacia, unchanged. No acute intracranial infarct. No acute or chronic intracranial hemorrhage. No mass lesion, midline shift or mass effect. No hydrocephalus. Ex vacuo dilatation of the right lateral ventricle related to the right cerebral encephalomalacia. No extra-axial fluid collection. Normal pituitary gland. Vascular: Major intracranial vascular flow voids are maintained. Skull and upper cervical spine: Craniocervical junction normal. Prior right parietooccipital craniectomy with cranioplasty. No scalp soft tissue abnormality. Bone marrow signal intensity normal. Sinuses/Orbits: Globes and orbital soft tissues within normal limits. Paranasal sinuses are clear. Small left mastoid effusion, of doubtful significance. Other: None. MRI CERVICAL SPINE FINDINGS Alignment: Vertebral bodies normally aligned with preservation of the normal cervical lordosis. No listhesis. Vertebrae: Vertebral body height maintained. No acute or chronic fracture. Bone marrow signal intensity mildly heterogeneous but within normal limits. Reactive endplate changes about the C5-6 and C6-7 interspaces. No worrisome osseous lesions. Cord: Signal intensity within the cervical spinal cord is normal Posterior Fossa, vertebral arteries, paraspinal tissues: Paraspinous soft tissues within normal limits. Normal intravascular flow voids present within the vertebral arteries bilaterally. Disc levels: C2-C3: Unremarkable. C3-C4: Mild diffuse disc bulge with uncovertebral hypertrophy. Mild left-sided facet hypertrophy. No stenosis. C4-C5: Mild disc bulge. Prominent left-sided facet degeneration. Resultant moderate left C5 foraminal stenosis. No canal or right foraminal narrowing. C5-C6: Chronic diffuse degenerative disc osteophyte, asymmetric to the right. Broad posterior component flattens the ventral CSF. Mild spinal stenosis without cord deformity. Severe right C6 foraminal stenosis. C6-C7:  Diffuse circumferential disc osteophyte. Flattening of the ventral CSF with mild spinal stenosis. Foramina remain patent. C7-T1:  Unremarkable. Visualized upper thoracic spine within normal limits. IMPRESSION: MRI HEAD IMPRESSION: 1. No acute intracranial abnormality. 2. Chronic right parieto-occipital encephalomalacia, stable. MRI CERVICAL SPINE IMPRESSION: 1. No acute abnormality within the cervical spine. 2. Prominent left-sided facet hypertrophy at C4-5 with resultant moderate left C5 foraminal stenosis. 3. Degenerative disc osteophyte at C5-6 with resultant severe right C6 foraminal stenosis. 4. Degenerative spondylolysis at C5-6 and C6-7 with resultant mild spinal stenosis. Electronically Signed   By: Jeannine Boga M.D.   On: 03/20/2018 05:27   Mr [name]  Result Date: 03/20/2018 CLINICAL DATA:  62 year old female with lumbar back pain for greater than 6 weeks. History of L1 compression fracture. EXAM: MRI LUMBAR SPINE WITHOUT CONTRAST TECHNIQUE: Multiplanar, multisequence MR imaging of the lumbar spine was performed. No intravenous contrast was administered. COMPARISON:  Lumbar radiographs 12/19/2017. Thoracic and lumbar MRI 06/10/2016. FINDINGS: Segmentation: Normal, which is the same numbering system on the 2017 MRI. Alignment:  Stable lumbar lordosis. Vertebrae: Stable vertebral body height since the 2017 MRI. Mild chronic endplate irregularity including at L1. Regressed degenerative appearing right posterolateral L4-L5 endplate edema. No new marrow edema or evidence of acute osseous abnormality. Visualized bone marrow signal is within normal limits. Conus medullaris and cauda equina: Conus extends to the L1 level. No lower spinal cord or conus signal abnormality. Paraspinal and other soft tissues: Negative visible abdominal viscera. Disc levels: T11-T12: Chronic anterior disc bulging and endplate spurring. No stenosis. T12-L1:  Negative. L1-L2:  Negative. L2-L3: Stable mild  disc bulging, facet and ligament flavum hypertrophy. Stable borderline to mild spinal stenosis.  L3-L4: Stable mild chronic disc bulging, mild facet and ligament flavum hypertrophy. Stable mild spinal stenosis. No lateral recess or foraminal stenosis. L4-L5: Stable mild circumferential disc bulging and endplate spurring. Stable mild facet and ligament flavum hypertrophy; small posteriorly situated synovial cyst on the right which should not cause neural compromise. Stable borderline to mild spinal stenosis with no lateral recess or foraminal stenosis. L5-S1:  Negative. IMPRESSION: 1. No acute findings in the lumbar spine. 2. Chronic borderline to mild multifactorial spinal stenosis L2-L3 through L4-L5 with no convincing lateral recess or foraminal stenosis. Electronically Signed   By: Genevie Ann M.D.   On: 03/20/2018 19:18    Scheduled Meds: . gabapentin  100 mg Oral q morning - 10a  . gabapentin  300 mg Oral QHS  . lacosamide  200 mg Oral BID  . pantoprazole  40 mg Oral Daily  . polyethylene glycol  17 g Oral Daily   Continuous Infusions:    LOS: 0 days   Marylu Lund, MD [facility] Pager [phone]  If 7PM-7AM, please contact night-coverage [email or web address] Password [facility] 03/21/2018, 2:38 PM

## 2018-03-21 NOTE — Progress Notes (Signed)
NEURO HOSPITALIST PROGRESS NOTE   Subjective: Patient awake, alert, in bed NAD on RA. Patient kept c/o bilateral leg numbness and feeling that her feet were being squeezed.   Exam: Vitals:   03/21/18 0353 03/21/18 0913  BP: (!) 109/52 (!) 91/51  Pulse: (!) 45 71  Resp: 18 18  Temp: 98 F (36.7 C) (!) 97.5 F (36.4 C)  SpO2: 95% 90%    Physical Exam  General: appears well-developed and well- nourished Psych: affect appropriate to situation Eyes: normal sclera and conjunctiva Head: normocephalic CV: S1, S2, pulses palpable throughout. Resp: 02 SATs WNL on RA, no adventitious breath sounds kin: [facility] Neuro:  Mental Status: Alert, oriented, thought content appropriate.  Speech fluent without evidence of aphasia.  Able to follow 3 step commands without difficulty. Cranial Nerves: II: left homonymous hemaniopsia III,IV, VI: ptosis not present, extra-ocular motions intact bilaterally pupils equal, round, reactive to light and accommodation V,VII: smile asymmetric, mild left Nasolabial fold flattening, facial light touch sensation normal bilaterally VIII: hearing normal bilaterally IX,X: uvula rises symmetrically XI: bilateral shoulder shrug XII: midline tongue extension Motor: Right : Upper extremity   5/5    Left:     Upper extremity   5/5  Lower extremity   5/5     Lower extremity   5/5 Though left leg is weaker than the right. Tone and bulk:normal tone throughout; no atrophy noted Sensory:  light touch intact throughout, bilaterally Deep Tendon Reflexes: 2+ and symmetric biceps, patella Plantars: Right: downgoing   Left: downgoing Cerebellar: Mild dysmetria on finger to nose bilaterally. Gait: deferred    Medications:  Scheduled: . lacosamide  200 mg Oral BID   Continuous:  LKG:[id number] **OR** acetaminophen (TYLENOL) oral liquid 160 mg/5 mL **OR** acetaminophen, albuterol  Pertinent Labs/Diagnostics:   Dg Chest 2 View  Result Date:  03/19/2018 CLINICAL DATA:  Seizures EXAM: CHEST - 2 VIEW COMPARISON:  August 05, 2017 FINDINGS: There is focal airspace consolidation with volume loss in a portion of the right middle lobe. Lungs elsewhere clear. Heart size and pulmonary vascularity are normal. No adenopathy. There is aortic atherosclerosis. No pneumothorax. There is evidence of an old healed fracture of the lateral right clavicle. No acute fracture evident. IMPRESSION: Focal airspace consolidation with volume loss in right middle lobe. Lungs elsewhere clear. Stable cardiac silhouette. Aortic atherosclerosis. No pneumothorax. Old fracture lateral right clavicle. Aortic Atherosclerosis (ICD10-I70.0). Followup PA and lateral chest radiographs recommended in 3-4 weeks following trial of antibiotic therapy to ensure resolution and exclude underlying malignancy. It should be noted an obstructing lesion in a segmental right middle lobe bronchus could cause the current appearance. Electronically Signed   By: Lowella Grip III M.D.   On: 03/19/2018 19:51   Ct Head Wo Contrast  Result Date: 03/19/2018 CLINICAL DATA:  5 seizures today. History of seizures. Transient left-sided numbness following the seizures. EXAM: CT HEAD WITHOUT CONTRAST TECHNIQUE: Contiguous axial images were obtained from the base of the skull through the vertex without intravenous contrast. COMPARISON:  Brain MR dated 12/19/2017. Head CT dated 12/19/2017. 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 Stable mildly enlarged ventricles and cortical sulci. No intracranial hemorrhage, mass lesion or CT evidence of acute infarction. Vascular: No hyperdense vessel or unexpected calcification. Skull: Stable right posterior craniotomy changes. Sinuses/Orbits: Unremarkable. Other: None. IMPRESSION: 1. No acute  abnormality. 2. 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 3. Stable mild diffuse cerebral and cerebellar atrophy. Electronically Signed   By: Claudie Revering M.D.   On: 03/19/2018 20:22   Mr Brain Wo Contrast  Result Date: 03/20/2018 CLINICAL DATA:  Initial evaluation for acute left-sided numbness. EXAM: MRI HEAD WITHOUT CONTRAST MRI CERVICAL SPINE WITHOUT CONTRAST TECHNIQUE: Multiplanar, multiecho pulse sequences of the brain and surrounding structures, and cervical spine, to include the craniocervical junction and cervicothoracic junction, were obtained without intravenous contrast. COMPARISON:  Prior CT from 03/19/2018 as well as previous MRI from 12/19/2017. FINDINGS: MRI HEAD FINDINGS Brain: Cerebral volume within normal limits and stable. Large volume right parieto-occipital encephalomalacia, unchanged. No acute intracranial infarct. No acute or chronic intracranial hemorrhage. No mass lesion, midline shift or mass effect. No hydrocephalus. Ex vacuo dilatation of the right lateral ventricle related to the right cerebral encephalomalacia. No extra-axial fluid collection. Normal pituitary gland. Vascular: Major intracranial vascular flow voids are maintained. Skull and upper cervical spine: Craniocervical junction normal. Prior right parietooccipital craniectomy with cranioplasty. No scalp soft tissue abnormality. Bone marrow signal intensity normal. Sinuses/Orbits: Globes and orbital soft tissues within normal limits. Paranasal sinuses are clear. Small left mastoid effusion, of doubtful significance. Other: None. MRI CERVICAL SPINE FINDINGS Alignment: Vertebral bodies normally aligned with preservation of the normal cervical lordosis. No listhesis. Vertebrae: Vertebral body height maintained. No acute or chronic fracture. Bone marrow signal intensity mildly heterogeneous but within normal limits. Reactive endplate changes about the C5-6 and C6-7 interspaces. No worrisome osseous lesions. Cord: Signal intensity within the cervical spinal cord is normal  Posterior Fossa, vertebral arteries, paraspinal tissues: Paraspinous soft tissues within normal limits. Normal intravascular flow voids present within the vertebral arteries bilaterally. Disc levels: C2-C3: Unremarkable. C3-C4: Mild diffuse disc bulge with uncovertebral hypertrophy. Mild left-sided facet hypertrophy. No stenosis. C4-C5: Mild disc bulge. Prominent left-sided facet degeneration. Resultant moderate left C5 foraminal stenosis. No canal or right foraminal narrowing. C5-C6: Chronic diffuse degenerative disc osteophyte, asymmetric to the right. Broad posterior component flattens the ventral CSF. Mild spinal stenosis without cord deformity. Severe right C6 foraminal stenosis. C6-C7: Diffuse circumferential disc osteophyte. Flattening of the ventral CSF with mild spinal stenosis. Foramina remain patent. C7-T1:  Unremarkable. Visualized upper thoracic spine within normal limits. IMPRESSION: MRI HEAD IMPRESSION: 1. No acute intracranial abnormality. 2. Chronic right parieto-occipital encephalomalacia, stable. MRI CERVICAL SPINE IMPRESSION: 1. No acute abnormality within the cervical spine. 2. Prominent left-sided facet hypertrophy at C4-5 with resultant moderate left C5 foraminal stenosis. 3. Degenerative disc osteophyte at C5-6 with resultant severe right C6 foraminal stenosis. 4. Degenerative spondylolysis at C5-6 and C6-7 with resultant mild spinal stenosis. Electronically Signed   By: Jeannine Boga M.D.   On: 03/20/2018 05:27   Mr Cervical Spine Wo Contrast  Result Date: 03/20/2018 CLINICAL DATA:  Initial evaluation for acute left-sided numbness. EXAM: MRI HEAD WITHOUT CONTRAST MRI CERVICAL SPINE WITHOUT CONTRAST TECHNIQUE: Multiplanar, multiecho pulse sequences of the brain and surrounding structures, and cervical spine, to include the craniocervical junction and cervicothoracic junction, were obtained without intravenous contrast. COMPARISON:  Prior CT from 03/19/2018 as well as previous MRI  from 12/19/2017. FINDINGS: MRI HEAD FINDINGS Brain: Cerebral volume within normal limits and stable. Large volume right parieto-occipital encephalomalacia, unchanged. No acute intracranial infarct. No acute or chronic intracranial hemorrhage. No mass lesion, midline shift or mass effect. No hydrocephalus. Ex vacuo dilatation of the right lateral ventricle related to the right cerebral  encephalomalacia. No extra-axial fluid collection. Normal pituitary gland. Vascular: Major intracranial vascular flow voids are maintained. Skull and upper cervical spine: Craniocervical junction normal. Prior right parietooccipital craniectomy with cranioplasty. No scalp soft tissue abnormality. Bone marrow signal intensity normal. Sinuses/Orbits: Globes and orbital soft tissues within normal limits. Paranasal sinuses are clear. Small left mastoid effusion, of doubtful significance. Other: None. MRI CERVICAL SPINE FINDINGS Alignment: Vertebral bodies normally aligned with preservation of the normal cervical lordosis. No listhesis. Vertebrae: Vertebral body height maintained. No acute or chronic fracture. Bone marrow signal intensity mildly heterogeneous but within normal limits. Reactive endplate changes about the C5-6 and C6-7 interspaces. No worrisome osseous lesions. Cord: Signal intensity within the cervical spinal cord is normal Posterior Fossa, vertebral arteries, paraspinal tissues: Paraspinous soft tissues within normal limits. Normal intravascular flow voids present within the vertebral arteries bilaterally. Disc levels: C2-C3: Unremarkable. C3-C4: Mild diffuse disc bulge with uncovertebral hypertrophy. Mild left-sided facet hypertrophy. No stenosis. C4-C5: Mild disc bulge. Prominent left-sided facet degeneration. Resultant moderate left C5 foraminal stenosis. No canal or right foraminal narrowing. C5-C6: Chronic diffuse degenerative disc osteophyte, asymmetric to the right. Broad posterior component flattens the ventral CSF.  Mild spinal stenosis without cord deformity. Severe right C6 foraminal stenosis. C6-C7: Diffuse circumferential disc osteophyte. Flattening of the ventral CSF with mild spinal stenosis. Foramina remain patent. C7-T1:  Unremarkable. Visualized upper thoracic spine within normal limits. IMPRESSION: MRI HEAD IMPRESSION: 1. No acute intracranial abnormality. 2. Chronic right parieto-occipital encephalomalacia, stable. MRI CERVICAL SPINE IMPRESSION: 1. No acute abnormality within the cervical spine. 2. Prominent left-sided facet hypertrophy at C4-5 with resultant moderate left C5 foraminal stenosis. 3. Degenerative disc osteophyte at C5-6 with resultant severe right C6 foraminal stenosis. 4. Degenerative spondylolysis at C5-6 and C6-7 with resultant mild spinal stenosis. Electronically Signed   By: Jeannine Boga M.D.   On: 03/20/2018 05:27   Mr [name]  Result Date: 03/20/2018 CLINICAL DATA:  62 year old female with lumbar back pain for greater than 6 weeks. History of L1 compression fracture. EXAM: MRI LUMBAR SPINE WITHOUT CONTRAST TECHNIQUE: Multiplanar, multisequence MR imaging of the lumbar spine was performed. No intravenous contrast was administered. COMPARISON:  Lumbar radiographs 12/19/2017. Thoracic and lumbar MRI 06/10/2016. FINDINGS: Segmentation: Normal, which is the same numbering system on the 2017 MRI. Alignment:  Stable lumbar lordosis. Vertebrae: Stable vertebral body height since the 2017 MRI. Mild chronic endplate irregularity including at L1. Regressed degenerative appearing right posterolateral L4-L5 endplate edema. No new marrow edema or evidence of acute osseous abnormality. Visualized bone marrow signal is within normal limits. Conus medullaris and cauda equina: Conus extends to the L1 level. No lower spinal cord or conus signal abnormality. Paraspinal and other soft tissues: Negative visible abdominal viscera. Disc levels: T11-T12: Chronic anterior disc bulging and  endplate spurring. No stenosis. T12-L1:  Negative. L1-L2:  Negative. L2-L3: Stable mild disc bulging, facet and ligament flavum hypertrophy. Stable borderline to mild spinal stenosis. L3-L4: Stable mild chronic disc bulging, mild facet and ligament flavum hypertrophy. Stable mild spinal stenosis. No lateral recess or foraminal stenosis. L4-L5: Stable mild circumferential disc bulging and endplate spurring. Stable mild facet and ligament flavum hypertrophy; small posteriorly situated synovial cyst on the right which should not cause neural compromise. Stable borderline to mild spinal stenosis with no lateral recess or foraminal stenosis. L5-S1:  Negative. IMPRESSION: 1. No acute findings in the lumbar spine. 2. Chronic borderline to mild multifactorial spinal stenosis L2-L3 through L4-L5 with no convincing lateral recess or foraminal stenosis.  Electronically Signed   By: Genevie Ann M.D.   On: 03/20/2018 19:18   Assessment: 62 y.o. female past medical history of ICH, seizures, DVT chronic back pain, COPD presents to the emergency room for left leg weakness, drowsiness in the morning of 8/16. Patient feeling weak and unsteady likely from Dilantin toxicity.  Patient denies taking additional doses, denies taking any new medications. Dilantin level was 21.5 -that corrects to 28 with current albumin of 3.3.  Impression: Phenytoin toxicity Left leg weakness-?  Postictal versus chronic progression versus psychogenic.  No MRI abnormality on brain C-spine or L-spine to explain recent worsening. .  Recommendations:  -Hold Dilantin dose until tomorrow -Repeat level tomorrow morning, along with albumin levels. -Vimpat-continue home dose -BLE doppler -Gabapentin 100 mg Q AM and Gabapentin 300 mg QHS; if tolerated can increase to Gabapentin 300 mg TID  -F/u outpatient Neurology  Plan was relayed to Dr. Wyline Copas.  Please call neurology with questions.  We will be available as needed.  Laurey Morale, MSN,  NP-C [facility] [phone]  Attending neurologist's note to follow  Attending Neurohospitalist Addendum Patient seen and examined with APP/Resident. Agree with the history and physical as documented above. Agree with the plan as documented, which I helped formulate. I have independently reviewed the chart, obtained history, review of systems and examined the patient.I have personally reviewed pertinent head/neck/spine imaging (CT/MRI). Please feel free to call with any questions. --- Amie Portland, MD [facility] Pager: [phone]  If 7pm to 7am, please call on call as listed on AMION.

## 2018-03-22 ENCOUNTER — Inpatient Hospital Stay (HOSPITAL_COMMUNITY): Payer: Medicare HMO

## 2018-03-22 DIAGNOSIS — I951 Orthostatic hypotension: Secondary | ICD-10-CM

## 2018-03-22 DIAGNOSIS — M79609 Pain in unspecified limb: Secondary | ICD-10-CM

## 2018-03-22 DIAGNOSIS — G40909 Epilepsy, unspecified, not intractable, without status epilepticus: Secondary | ICD-10-CM

## 2018-03-22 DIAGNOSIS — T420X4A Poisoning by hydantoin derivatives, undetermined, initial encounter: Secondary | ICD-10-CM

## 2018-03-22 DIAGNOSIS — J45909 Unspecified asthma, uncomplicated: Secondary | ICD-10-CM

## 2018-03-22 DIAGNOSIS — Z720 Tobacco use: Secondary | ICD-10-CM

## 2018-03-22 LAB — BASIC METABOLIC PANEL
ANION GAP: 5 (ref 5–15)
BUN: 13 mg/dL (ref 6–20)
CO2: 27 mmol/L (ref 22–32)
Calcium: 8.7 mg/dL — ABNORMAL LOW (ref 8.9–10.3)
Chloride: 111 mmol/L (ref 98–111)
Creatinine, Ser: 0.74 mg/dL (ref 0.44–1.00)
GFR calc Af Amer: >60 mL/min (ref >60)
GFR calc non Af Amer: >60 mL/min (ref >60)
GLUCOSE: 79 mg/dL (ref 70–99)
POTASSIUM: 4.2 mmol/L (ref 3.5–5.1)
Sodium: 143 mmol/L (ref 135–145)

## 2018-03-22 LAB — HEMOGLOBIN A1C
Hgb A1c MFr Bld: 5.1 % (ref 4.8–5.6)
MEAN PLASMA GLUCOSE: 99.67 mg/dL

## 2018-03-22 LAB — PHENYTOIN LEVEL, TOTAL: Phenytoin Lvl: 14.7 ug/mL (ref 10.0–20.0)

## 2018-03-22 LAB — ALBUMIN: Albumin: 3 g/dL — ABNORMAL LOW (ref 3.5–5.0)

## 2018-03-22 NOTE — Consult Note (Signed)
Physical Medicine and Rehabilitation Consult Reason for Consult: Left-sided weakness and decreased mobility Referring Physician: [facility]   HPI: [name] is a 62 y.o. right-handed female with history of asthma, chronic low back pain, tobacco abuse, previous history of intracranial bleed 1989, seizure disorder maintained on Dilantin and Vimpat.  History taken from chart review and patient. Patient lives with daughter and son-in-law.  Independent prior to admission.  2 level home with bedroom upstairs.  Family can assist as needed.  Presented 03/19/2018 with difficulty ambulating for 2 days with ataxia.  Also noted some numbness of the left side. CT reviewed, unremarkable for acute intracranial process. MRI showed no acute intracranial abnormality.  Chronic right parietal occipital encephalomalacia.  MRI cervical spine no acute abnormality within the cervical spine.  Prominent left-sided facet hypertrophy at C4-5 with resultant moderate left C5 foraminal stenosis.  MRI lumbar spine again no acute changes.  Dilantin level 33.8.  Neurology consulted ataxia felt related to Dilantin toxicity.  Patient currently remains only on Vimpat as prior to admission and Dilantin on hold.  Lower extremity Dopplers negative for DVT.  Patient with some orthostasis 88/52 received fluid bolus.  Tolerating a regular diet.  Therapy evaluations completed with recommendations of physical medicine rehab consult.   Review of Systems  Constitutional: Negative for chills and fever.  HENT: Negative for hearing loss.   Eyes: Negative for blurred vision and double vision.  Respiratory: Positive for shortness of breath. Negative for cough.   Cardiovascular: Negative for chest pain, palpitations and leg swelling.  Gastrointestinal: Positive for constipation. Negative for nausea and vomiting.       GERD  Genitourinary: Negative for dysuria.  Musculoskeletal: Positive for back pain and myalgias.  Skin: Negative for rash.    Neurological: Positive for tingling, seizures and headaches.  All other systems reviewed and are negative.  Past Medical History:  Diagnosis Date  . Asthma   . Cerebral hemorrhage ([place]) 1989  . Chronic back pain   . COPD (chronic obstructive pulmonary disease) ([place])   . DDD (degenerative disc disease) 06/12/2013  . DVT (deep venous thrombosis) ([place])    "she's had several since 1990"  . GERD (gastroesophageal reflux disease)   . Headache    "usually around time when she's had a seizure"  . History of blood transfusion    "when she was a baby"  . History of stomach ulcers   . Kidney stones   . Seizures ([place])    "because of her brain surgery"  . Stroke [facility]) [phone]   family denies residual on 11/09/2014  . Tunnel vision    "since brain OR"   Past Surgical History:  Procedure Laterality Date  . [place]   craniotomy with hematoma evacuation  . CESAREAN SECTION  1979; 1987; 1989  . HEMORRHOID SURGERY    . TUBAL LIGATION  1990's   Family History  Problem Relation Age of Onset  . Heart attack Mother        4 MIs, started in her 47s, also vaginal cancer and colon cancer  . Cancer Mother   . Heart attack Father        Died suddenly age 76 - autopsy revealed heart attack her patient  . Cancer Brother        Sinus cancer   Social History:  reports that she quit smoking about 4 months ago. Her smoking use included cigarettes. She has a 10.00 pack-year smoking history. She has  never used smokeless tobacco. She reports that she does not drink alcohol or use drugs. Allergies:  Allergies  Allergen Reactions  . Zonisamide Other (See Comments)    Numbness and tingling over whole body  . Codeine Nausea And Vomiting  . Keppra [Levetiracetam] Other (See Comments)    Causes seizures.   . Pregabalin Nausea And Vomiting  . Olive Oil Rash   Medications Prior to Admission  Medication Sig Dispense Refill  . acetaminophen (TYLENOL) 325 MG tablet Take 650 mg by mouth daily as  needed for headache.    . albuterol (PROVENTIL HFA;VENTOLIN HFA) 108 (90 Base) MCG/ACT inhaler Inhale 2 puffs into the lungs every 6 (six) hours as needed for wheezing or shortness of breath.    . lacosamide (VIMPAT) 200 MG TABS tablet Take 200 mg by mouth 2 (two) times daily.    . phenytoin (DILANTIN) 100 MG ER capsule Take 2 capsules (200 mg total) by mouth at bedtime. (Patient taking differently: Take 100-200 mg by mouth See admin instructions. Take one capsule (100 mg) by mouth with two 30 mg capsules every morning (total dose 160 mg) and take two capsules (200 mg) at night)    . phenytoin (DILANTIN) 30 MG ER capsule Take 60 mg by mouth See admin instructions. Take two capsules (30 mg) by mouth every morning with a 100 mg capsule for a total dose of 160 mg    . pantoprazole (PROTONIX) 40 MG tablet Take 1 tablet (40 mg total) by mouth daily. (Patient not taking: Reported on 12/19/2017) 30 tablet 0  . vitamin B-12 (CYANOCOBALAMIN) 1000 MCG tablet Take 1 tablet (1,000 mcg total) by mouth daily. (Patient not taking: Reported on 12/19/2017) 30 tablet 0    Home: Home Living Family/patient expects to be discharged to:: Private residence Living Arrangements: Children Available Help at Discharge: Family, Available PRN/intermittently Type of Home: House Home Access: Level entry Home Layout: Two level Alternate Level Stairs-Number of Steps: flight Alternate Level Stairs-Rails: Right Bathroom Shower/Tub: Tub/shower unit, Multimedia programmer: Standard Home Equipment: None Additional Comments: she lives with her mother-in-law, daughter, and grandchildren  Functional History: Prior Function Level of Independence: Needs assistance Gait / Transfers Assistance Needed: independent, no AD ADL's / Homemaking Assistance Needed: does not drive Functional Status:  Mobility: Bed Mobility Overal bed mobility: Needs Assistance Bed Mobility: Rolling, Sidelying to Sit, Sit to Sidelying Rolling:  Supervision Sidelying to sit: Min guard(rolling to the left, use of bed rail to push up) Sit to sidelying: Min assist, HOB elevated(min A for BLE back into bed) General bed mobility comments: All bed mobility required significantly increased time and effort, use of bed rail Transfers Overall transfer level: Needs assistance Equipment used: Rolling walker (2 wheeled) Transfers: Sit to/from Stand Sit to Stand: Min assist General transfer comment: unable to stand without UE support Ambulation/Gait Ambulation/Gait assistance: Mod assist Gait Distance (Feet): 20 Feet Assistive device: Rolling walker (2 wheeled) Gait Pattern/deviations: Ataxic, Decreased weight shift to left, Decreased stance time - left, Decreased step length - right, Staggering left General Gait Details: pt with increased wt through UE's and mild buckling of L knee as well as difficulty turning RW Gait velocity: decreased Gait velocity interpretation: <1.31 ft/sec, indicative of household ambulator    ADL: ADL Overall ADL's : Needs assistance/impaired Eating/Feeding: Modified independent, Sitting, Set up Eating/Feeding Details (indicate cue type and reason): eating a sandwich when OT entered, discoordinated movement with utensils Grooming: Minimal assistance, Wash/dry hands, Wash/dry face, Sitting Grooming Details (indicate cue  type and reason): MUST be seated for grooming task completion, requires BUE for standing Upper Body Bathing: Min guard, Sitting Lower Body Bathing: Min guard, Sitting/lateral leans Lower Body Bathing Details (indicate cue type and reason): able to cross feet to knees Upper Body Dressing : Min guard Lower Body Dressing: Min guard, Sitting/lateral leans Lower Body Dressing Details (indicate cue type and reason): when transitioning from sit <>Stand for pants or underwear she will require mod A Toilet Transfer: Moderate assistance, Ambulation, RW, Comfort height toilet, Grab bars Toilet Transfer  Details (indicate cue type and reason): dependent on RW - L knee buckling Toileting- Clothing Manipulation and Hygiene: Min guard, Sitting/lateral lean Functional mobility during ADLs: Moderate assistance, Rolling walker  Cognition: Cognition Overall Cognitive Status: No family/caregiver present to determine baseline cognitive functioning Orientation Level: Oriented X4 Cognition Arousal/Alertness: Awake/alert Behavior During Therapy: WFL for tasks assessed/performed Overall Cognitive Status: No family/caregiver present to determine baseline cognitive functioning General Comments: pt with great concern about what's going on and she reports she has been doing well, [place] and was getting ready to fish with boyfriend when event happened yesterday; repeating herself and perseverating on events  Blood pressure (!) 95/46, pulse (!) 51, temperature 98.1 F (36.7 C), resp. rate 16, height 5\' 7"  (1.702 m), weight 59.2 kg, SpO2 98 %. Physical Exam  Vitals reviewed. Constitutional: She appears well-developed.  Thin  HENT:  Head: Normocephalic and atraumatic.  Eyes: EOM are normal. Right eye exhibits no discharge. Left eye exhibits no discharge.  Neck: Normal range of motion. Neck supple. No thyromegaly present.  Cardiovascular: Normal rate, regular rhythm and normal heart sounds.  Respiratory: Effort normal and breath sounds normal. No respiratory distress.  GI: Soft. Bowel sounds are normal. She exhibits no distension.  Musculoskeletal:  No edema or tenderness in extremities  Neurological: She is alert.  Follows simple commands.   Provides her name, age and date of birth. Motor: 4-/5 grossly throughout  Skin: Skin is warm and dry.  Psychiatric: Her affect is blunt. Her speech is delayed. She is slowed.    Results for orders placed or performed during the hospital encounter of 03/19/18 (from the past 24 hour(s))  Troponin I     Status: None   Collection Time:  03/21/18  3:10 PM  Result Value Ref Range   Troponin I <0.03 <0.03 ng/mL   Mr [name]  Result Date: 03/20/2018 CLINICAL DATA:  62 year old female with lumbar back pain for greater than 6 weeks. History of L1 compression fracture. EXAM: MRI LUMBAR SPINE WITHOUT CONTRAST TECHNIQUE: Multiplanar, multisequence MR imaging of the lumbar spine was performed. No intravenous contrast was administered. COMPARISON:  Lumbar radiographs 12/19/2017. Thoracic and lumbar MRI 06/10/2016. FINDINGS: Segmentation: Normal, which is the same numbering system on the 2017 MRI. Alignment:  Stable lumbar lordosis. Vertebrae: Stable vertebral body height since the 2017 MRI. Mild chronic endplate irregularity including at L1. Regressed degenerative appearing right posterolateral L4-L5 endplate edema. No new marrow edema or evidence of acute osseous abnormality. Visualized bone marrow signal is within normal limits. Conus medullaris and cauda equina: Conus extends to the L1 level. No lower spinal cord or conus signal abnormality. Paraspinal and other soft tissues: Negative visible abdominal viscera. Disc levels: T11-T12: Chronic anterior disc bulging and endplate spurring. No stenosis. T12-L1:  Negative. L1-L2:  Negative. L2-L3: Stable mild disc bulging, facet and ligament flavum hypertrophy. Stable borderline to mild spinal stenosis. L3-L4: Stable mild chronic disc bulging, mild  facet and ligament flavum hypertrophy. Stable mild spinal stenosis. No lateral recess or foraminal stenosis. L4-L5: Stable mild circumferential disc bulging and endplate spurring. Stable mild facet and ligament flavum hypertrophy; small posteriorly situated synovial cyst on the right which should not cause neural compromise. Stable borderline to mild spinal stenosis with no lateral recess or foraminal stenosis. L5-S1:  Negative. IMPRESSION: 1. No acute findings in the lumbar spine. 2. Chronic borderline to mild multifactorial spinal stenosis  L2-L3 through L4-L5 with no convincing lateral recess or foraminal stenosis. Electronically Signed   By: Genevie Ann M.D.   On: 03/20/2018 19:18    Assessment/Plan: Diagnosis: Medication toxicity Labs and images (see above) independently reviewed.  Records reviewed and summated above.  1. Does the need for close, 24 hr/day medical supervision in concert with the patient's rehab needs make it unreasonable for this patient to be served in a less intensive setting? No  2. Co-Morbidities requiring supervision/potential complications: orthostasis (encoruage fluids, conservative measures), asthma (monitor RR and O2 Sats with increased exertion), chronic low back pain, tobacco abuse (counsel), previous history of intracranial bleed 1989, seizure disorder (meds per Neuro) 3. Due to safety, disease management and patient education, does the patient require 24 hr/day rehab nursing? Potentially 4. Does the patient require coordinated care of a physician, rehab nurse, PT (1-2 hrs/day, 5 days/week) and OT (1-2 hrs/day, 5 days/week) to address physical and functional deficits in the context of the above medical diagnosis(es)? Potentially Addressing deficits in the following areas: balance, endurance, locomotion, strength, transferring, bathing, dressing, toileting and psychosocial support 5. Can the patient actively participate in an intensive therapy program of at least 3 hrs of therapy per day at least 5 days per week? Yes 6. The potential for patient to make measurable gains while on inpatient rehab is good 7. Anticipated functional outcomes upon discharge from inpatient rehab are modified independent  with PT, modified independent with OT, n/a with SLP. 8. Estimated rehab length of stay to reach the above functional goals is: 6-9 days. 9. Anticipated D/C setting: Home 10. Anticipated post D/C treatments: HH therapy and Home excercise program 11. Overall Rehab/Functional Prognosis: good  RECOMMENDATIONS: This  patient's condition is appropriate for continued rehabilitative care in the following setting: Patient does not have a diagnosis amenable to IRF.  Anticipate function will improve as dilantin is cleared from system. Recommend home with [facility] or SNF if caregivers unavailable. Patient has agreed to participate in recommended program. Potentially Note that insurance prior authorization may be required for reimbursement for recommended care.  Comment: Rehab Admissions Coordinator to follow up.   I have personally performed a face to face diagnostic evaluation, including, but not limited to relevant history and physical exam findings, of this patient and developed relevant assessment and plan.  Additionally, I have reviewed and concur with the physician assistant's documentation above.   Delice Lesch, MD, ABPMR Lavon Paganini Angiulli, PA-C 03/22/2018

## 2018-03-22 NOTE — Progress Notes (Signed)
PROGRESS NOTE    [name] UNCAPHER  IZT:[id number] DOB: [birth year] DOA: 03/19/2018 PCP: [name], MD    Brief Narrative:  62 y.o. female with history of seizures and asthma has been experiencing difficulty walking last 2 days with ataxia.  Also has been having numbness running from the neck down towards the left side of her body.  Denies any fall.  Denies any seizures.  ED Course: In the ER patient is a Dilaudid level is found to be elevated at around 33.8 which could explain patient's symptoms but since patient has had left-sided numbness neurologist on-call has been consulted who requested MRI brain to rule out any stroke.  Exam patient is moving all extremities with mild weakness of the left side.  Assessment & Plan:   Principal Problem:   Dilantin toxicity Active Problems:   Seizure disorder ([place])   History of Intracranial bleed   Left-sided weakness   Orthostasis   Tobacco abuse  1. Dilantin toxicity with ataxia 1. Possible ataxia from Dilantin toxicity.   2. Dilantin remains on hold, levels improving although still in the supertherapeutic range 3. Discussed with Neurology. Continue to hold dilantin. Will need to continue to monitor closely on tele given concerns of cardiotoxicity (see below) 4. Level remains supertherapeutic. Consulted pharmacy for dosing 5. MRI spine reviewed, no acute process noted 6. PT recommends CIR, consult requested 2. History of seizures on Vimpat and Dilantin.   1. Vimpat has been continued 2. Dilantin continue to be on hold per above 3. No further seizures thus far 4. Pharmacy assisting with dilantin dosing. Still supertherapeutic today 3. History of asthma 1. Currently on minimal O2 support 2. No wheezing on exam 4. Previous history of intracranial bleed. 1. Remains stable at present. Head CT personally reviewed with Neurologist. Old changes noted 2. Stable at present 5. History of DVT. 1. Stable currently 2. LE dopplers ordered by  Neurology, negative for DVT 6. Hypotension 1. Recent hypotension, improved with LR bolus 2. Will order 250cc LR bolus 3. BP stable at present. Will continue tele 7. Recent Chest pain 1. EKG ordered and reviewed. QTc of 439. No ischemic changes apparent 2. Have ordered troponin x 1, negative 3. Improvement noted with trial of toradol as well as protonix 4. Continue on tele given concerns of cardiotoxicity of dilantin 8. LE numbness 1. Discussed with Neurology 2. Recommendations for trial of scheduled neuronin 3. Stable today 4. HgbA1c ordered and reviewed. Noted to be 5.1, ruled out diabetes  DVT prophylaxis: SCD's Code Status: Full Family Communication: Pt in room, family not at bedside Disposition Plan: Uncertain at this time  Consultants:   Neurology  Procedures:    Antimicrobials: Anti-infectives (From admission, onward)   None      Subjective: Anxious about having high dilantin level prior to admission  Objective: Vitals:   03/22/18 0000 03/22/18 0315 03/22/18 0758 03/22/18 1233  BP: (!) 110/42 (!) 95/46 (!) 105/51 104/66  Pulse: (!) 48 (!) 51 60 89  Resp: 18 16 18 18   Temp: 98.1 F (36.7 C)  98.2 F (36.8 C) 98.5 F (36.9 C)  TempSrc:   Oral Oral  SpO2: 96% 98% 93% 93%  Weight:      Height:        Intake/Output Summary (Last 24 hours) at 03/22/2018 1358 Last data filed at 03/22/2018 1200 Gross per 24 hour  Intake 600 ml  Output -  Net 600 ml   Filed Weights   03/19/18 1627 03/19/18 2210  Weight: 59 kg 59.2 kg    Examination: General exam: Awake, laying in bed, in nad Respiratory system: Normal respiratory effort, no wheezing Cardiovascular system: regular rate, s1, s2 Gastrointestinal system: Soft, nondistended, positive BS Central nervous system: CN2-12 grossly intact, strength intact Extremities: Perfused, no clubbing Skin: Normal skin turgor, no notable skin lesions seen Psychiatry: Mood normal // no visual hallucinations   Data  Reviewed: I have personally reviewed following labs and imaging studies  CBC: Recent Labs  Lab 03/19/18 1752  WBC 3.5*  HGB 11.9*  HCT 37.4  MCV 103.3*  PLT 262*   Basic Metabolic Panel: Recent Labs  Lab 03/19/18 1752 03/20/18 0535 03/22/18 0455  NA 142 144 143  K 3.6 3.6 4.2  CL 109 110 111  CO2 26 27 27   GLUCOSE 88 83 79  BUN 5* 6 13  CREATININE 0.65 0.77 0.74  CALCIUM 8.8* 8.6* 8.7*   GFR: Estimated Creatinine Clearance: 69.9 mL/min (by C-G formula based on SCr of 0.74 mg/dL). Liver Function Tests: Recent Labs  Lab 03/20/18 0535 03/22/18 0455  AST 16  --   ALT 11  --   ALKPHOS 48  --   BILITOT 0.7  --   PROT 5.5*  --   ALBUMIN 3.3* 3.0*   No results for input(s): LIPASE, AMYLASE in the last 168 hours. No results for input(s): AMMONIA in the last 168 hours. Coagulation Profile: No results for input(s): INR, PROTIME in the last 168 hours. Cardiac Enzymes: Recent Labs  Lab 03/21/18 1510  TROPONINI <0.03   BNP (last 3 results) No results for input(s): PROBNP in the last 8760 hours. HbA1C: Recent Labs    03/21/18 1510  HGBA1C 5.1   CBG: Recent Labs  Lab 03/19/18 1746  GLUCAP 87   Lipid Profile: No results for input(s): CHOL, HDL, LDLCALC, TRIG, CHOLHDL, LDLDIRECT in the last 72 hours. Thyroid Function Tests: No results for input(s): TSH, T4TOTAL, FREET4, T3FREE, THYROIDAB in the last 72 hours. Anemia Panel: Recent Labs    03/20/18 0810  VITAMINB12 168*  FOLATE 7.0  FERRITIN 35  TIBC 242*  IRON 154  RETICCTPCT 1.2   Sepsis Labs: No results for input(s): PROCALCITON, LATICACIDVEN in the last 168 hours.  Recent Results (from the past 240 hour(s))  Urine culture     Status: Abnormal   Collection Time: 03/19/18  6:48 PM  Result Value Ref Range Status   Specimen Description URINE, CLEAN CATCH  Final   Special Requests   Final    NONE Performed at [facility], [address] [place], [place] [address]    Culture MULTIPLE  SPECIES PRESENT, SUGGEST RECOLLECTION (A)  Final   Report Status 03/20/2018 FINAL  Final     Radiology Studies: Mr [name]  Result Date: 03/20/2018 CLINICAL DATA:  62 year old female with lumbar back pain for greater than 6 weeks. History of L1 compression fracture. EXAM: MRI LUMBAR SPINE WITHOUT CONTRAST TECHNIQUE: Multiplanar, multisequence MR imaging of the lumbar spine was performed. No intravenous contrast was administered. COMPARISON:  Lumbar radiographs 12/19/2017. Thoracic and lumbar MRI 06/10/2016. FINDINGS: Segmentation: Normal, which is the same numbering system on the 2017 MRI. Alignment:  Stable lumbar lordosis. Vertebrae: Stable vertebral body height since the 2017 MRI. Mild chronic endplate irregularity including at L1. Regressed degenerative appearing right posterolateral L4-L5 endplate edema. No new marrow edema or evidence of acute osseous abnormality. Visualized bone marrow signal is within normal limits. Conus medullaris and cauda equina: Conus extends  to the L1 level. No lower spinal cord or conus signal abnormality. Paraspinal and other soft tissues: Negative visible abdominal viscera. Disc levels: T11-T12: Chronic anterior disc bulging and endplate spurring. No stenosis. T12-L1:  Negative. L1-L2:  Negative. L2-L3: Stable mild disc bulging, facet and ligament flavum hypertrophy. Stable borderline to mild spinal stenosis. L3-L4: Stable mild chronic disc bulging, mild facet and ligament flavum hypertrophy. Stable mild spinal stenosis. No lateral recess or foraminal stenosis. L4-L5: Stable mild circumferential disc bulging and endplate spurring. Stable mild facet and ligament flavum hypertrophy; small posteriorly situated synovial cyst on the right which should not cause neural compromise. Stable borderline to mild spinal stenosis with no lateral recess or foraminal stenosis. L5-S1:  Negative. IMPRESSION: 1. No acute findings in the lumbar spine. 2. Chronic borderline to  mild multifactorial spinal stenosis L2-L3 through L4-L5 with no convincing lateral recess or foraminal stenosis. Electronically Signed   By: Genevie Ann M.D.   On: 03/20/2018 19:18    Scheduled Meds: . gabapentin  100 mg Oral q morning - 10a  . gabapentin  300 mg Oral QHS  . lacosamide  200 mg Oral BID  . pantoprazole  40 mg Oral Daily  . polyethylene glycol  17 g Oral Daily   Continuous Infusions:    LOS: 1 day   Marylu Lund, MD [facility] Pager [phone]  If 7PM-7AM, please contact night-coverage [email or web address] Password [email or web address] 03/22/2018, 1:58 PM

## 2018-03-22 NOTE — Progress Notes (Signed)
*  Preliminary Results* Bilateral lower extremity venous duplex completed. Bilateral lower extremities are negative for deep vein thrombosis. There is no evidence of Baker's cyst bilaterally.  03/22/2018 11:43 AM Jinny Blossom Dawna Part

## 2018-03-22 NOTE — Progress Notes (Signed)
Occupational Therapy Treatment Patient Details Name: [name] MRN: [id number] DOB: 1956/05/07 Today's Date: 03/22/2018    History of present illness [name] is an 62 y.o. female past medical history of ICH, seizures, DVT chronic back pain, COPD presents to the emergency room for left leg weakness, drowsiness in the morning of 8/16.  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    OT comments  Pt performed toileting, dressing, grooming with min guard to set up assist. She is adamant she will not go to SNF and is not appropriate for CIR per note. Pt reports she has 24 hour assist at home of her ex mother in law. Educated pt in the importance of using RW at home as she reports numbness in L LE and is a fall risk. Pt in agreement, updated d/c to [facility] as long as pt has 24 hour supervision.  Follow Up Recommendations  Home health OT;Supervision/Assistance - 24 hour    Equipment Recommendations  3 in 1 bedside commode(RW)    Recommendations for Other Services      Precautions / Restrictions Precautions Precautions: Fall       Mobility Bed Mobility Overal bed mobility: Needs Assistance Bed Mobility: Supine to Sit     Supine to sit: Supervision     General bed mobility comments: for safety  Transfers   Equipment used: Rolling walker (2 wheeled) Transfers: Sit to/from Stand Sit to Stand: Min guard         General transfer comment: no physical assist    Balance Overall balance assessment: Needs assistance   Sitting balance-Leahy Scale: Good Sitting balance - Comments: no LOB with LB dressing     Standing balance-Leahy Scale: Poor                             ADL either performed or assessed with clinical judgement   ADL Overall ADL's : Needs assistance/impaired     Grooming: Wash/dry hands;Wash/dry face;Brushing hair;Sitting;Set up           Upper Body  Dressing : Supervision/safety;Sitting Upper Body Dressing Details (indicate cue type and reason): front opening gown Lower Body Dressing: Supervision/safety;Sitting/lateral leans Lower Body Dressing Details (indicate cue type and reason): socks Toilet Transfer: Min guard;RW;Ambulation   Toileting- Clothing Manipulation and Hygiene: Min guard;Sit to/from stand;Sitting/lateral lean       Functional mobility during ADLs: Min guard;Rolling walker General ADL Comments: Pt with poor awareness of need for RW given her L LE numbness and fall risk, educated.     Vision       Perception     Praxis      Cognition Arousal/Alertness: Awake/alert Behavior During Therapy: Flat affect Overall Cognitive Status: Impaired/Different from baseline Area of Impairment: Safety/judgement                         Safety/Judgement: Decreased awareness of deficits;Decreased awareness of safety              Exercises     Shoulder Instructions       General Comments      Pertinent Vitals/ Pain       Pain Assessment: No/denies pain  Home Living  Prior Functioning/Environment              Frequency  Min 3X/week        Progress Toward Goals  OT Goals(current goals can now be found in the care plan section)  Progress towards OT goals: Progressing toward goals  Acute Rehab OT Goals Patient Stated Goal: get back to independent OT Goal Formulation: With patient Time For Goal Achievement: 04/03/18 Potential to Achieve Goals: Good  Plan Discharge plan needs to be updated    Co-evaluation                 AM-PAC PT "6 Clicks" Daily Activity     Outcome Measure   Help from another person eating meals?: None Help from another person taking care of personal grooming?: A Little Help from another person toileting, which includes using toliet, bedpan, or urinal?: A Little Help from another person bathing  (including washing, rinsing, drying)?: A Little Help from another person to put on and taking off regular upper body clothing?: None Help from another person to put on and taking off regular lower body clothing?: A Little 6 Click Score: 20    End of Session Equipment Utilized During Treatment: Gait belt;Rolling walker  OT Visit Diagnosis: Unsteadiness on feet (R26.81);Other abnormalities of gait and mobility (R26.89);Muscle weakness (generalized) (M62.81);Ataxia, unspecified (R27.0)   Activity Tolerance Patient tolerated treatment well   Patient Left in chair;with call bell/phone within reach;with chair alarm set   Nurse Communication          Time: 1435-1500 OT Time Calculation (min): 25 min  Charges: OT General Charges $OT Visit: 1 Visit OT Treatments $Self Care/Home Management : 23-37 mins  Malka So 03/22/2018, 4:05 PM  03/22/2018 Nestor Lewandowsky, OTR/L Pager: [phone]

## 2018-03-22 NOTE — Progress Notes (Addendum)
Pharmacy asked to assist with phenytoin monitoring. Patient admitted with leg numbness, weakness and drowsiness. Patient with likely phenytoin toxicity. Level on admit was 21.5 with corrected level of 28. Phenyotin on hold.  Level drawn this AM was 14.7 with albumin of 3. Corrected level is therefore 21 mcg/ml.   Home dose of phenytoin is 160mg  (100mg  + 30mg  x 2) in the AM and 200mg  in the PM,   Plan:  1) Would recommend holding dose for now until corrected level is within the normal range of 10-20 mcg/ml 2) Would repeat phenytoin level in AM with CMP 3) F/U neurology recs.   Thank you for the consult,  Shamar Engelmann A. Levada Dy, PharmD, [place] Pager: [phone] Please utilize Amion for appropriate phone number to reach the unit pharmacist ([place])

## 2018-03-22 NOTE — Progress Notes (Signed)
Inpatient Rehabilitation-Admissions Coordinator   Spoke with pt at the bedside as follow up from PM&R consult. AC communicated with pt that she does not have a diagnosis that would support CIR admission. Pt verbalized understanding and would like more information about her other options. AC has communicated with CM/SW regarding new dispo needs.   AC will sign off at this time.   Jhonnie Garner, OTR/L  Rehab Admissions Coordinator  [phone] 03/22/2018 2:33 PM

## 2018-03-22 NOTE — Progress Notes (Addendum)
Physical Therapy Treatment Patient Details Name: [name] MRN: [id number] DOB: 07-07-1956 Today's Date: 03/22/2018    History of Present Illness [name] is an 62 y.o. female past medical history of ICH, seizures, DVT chronic back pain, COPD presents to the emergency room for left leg weakness, drowsiness in the morning of 8/16.  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     PT Comments    Pt was seen for evaluation of mobility and progression to stairs today.  Her complaints of cramps on feet and legs were reported to nursing and they were already aware.  Her plan is to progress standing balance challenges incl stairs to prepare for transition to rehab setting and then home.  Follow acutely as planned for these goals, working with RW mainly as a safety for standing instability. Pt is complaining of numbness on feet.    Follow Up Recommendations  CIR;Supervision for mobility/OOB     Equipment Recommendations  Rolling walker with 5" wheels    Recommendations for Other Services Rehab consult;OT consult     Precautions / Restrictions Precautions Precautions: Fall Restrictions Weight Bearing Restrictions: No    Mobility  Bed Mobility Overal bed mobility: Needs Assistance Bed Mobility: Supine to Sit     Supine to sit: Supervision     General bed mobility comments: for safety  Transfers   Equipment used: Rolling walker (2 wheeled) Transfers: Sit to/from Stand Sit to Stand: Min guard         General transfer comment: no physical assist  Ambulation/Gait Ambulation/Gait assistance: Min guard Gait Distance (Feet): 60 Feet Assistive device: Rolling walker (2 wheeled) Gait Pattern/deviations: Wide base of support;Ataxic;Decreased stride length;Step-through pattern Gait velocity: decreased Gait velocity interpretation: <1.8 ft/sec, indicate of risk for recurrent falls General Gait  Details: mild instability of L ankle noted with gait on RW   Stairs Stairs: Yes Stairs assistance: Min guard;Min assist Stair Management: One rail Right;Forwards;Step to pattern Number of Stairs: 4 General stair comments: prompted her to use stronger leg to pull up and lead down with weaker leg   Wheelchair Mobility    Modified Rankin (Stroke Patients Only)       Balance Overall balance assessment: Needs assistance   Sitting balance-Leahy Scale: Good Sitting balance - Comments: no LOB with LB dressing     Standing balance-Leahy Scale: Poor Standing balance comment: worse with standing balance after fatigued by stairs                            Cognition Arousal/Alertness: Awake/alert Behavior During Therapy: Flat affect Overall Cognitive Status: Impaired/Different from baseline Area of Impairment: Safety/judgement                         Safety/Judgement: Decreased awareness of deficits;Decreased awareness of safety            Exercises General Exercises - Lower Extremity Ankle Circles/Pumps: AROM;Both;5 reps Gluteal Sets: AROM;Both;10 reps Heel Slides: AROM;Both;10 reps    General Comments General comments (skin integrity, edema, etc.): pt is complaining of numbness on the bottom of her feet      Pertinent Vitals/Pain Pain Assessment: No/denies pain    Home Living                      Prior Function  PT Goals (current goals can now be found in the care plan section) Acute Rehab PT Goals Patient Stated Goal: get back to independent Progress towards PT goals: Progressing toward goals    Frequency    Min 3X/week      PT Plan Current plan remains appropriate    Co-evaluation              AM-PAC PT "6 Clicks" Daily Activity  Outcome Measure  Difficulty turning over in bed (including adjusting bedclothes, sheets and blankets)?: A Little Difficulty moving from lying on back to sitting on the side  of the bed? : A Little Difficulty sitting down on and standing up from a chair with arms (e.g., wheelchair, bedside commode, etc,.)?: Unable Help needed moving to and from a bed to chair (including a wheelchair)?: A Little Help needed walking in hospital room?: A Little Help needed climbing 3-5 steps with a railing? : A Little 6 Click Score: 16    End of Session Equipment Utilized During Treatment: Gait belt Activity Tolerance: Patient tolerated treatment well Patient left: in chair;with call bell/phone within reach Nurse Communication: Mobility status PT Visit Diagnosis: Other abnormalities of gait and mobility (R26.89);Ataxic gait (R26.0);Unsteadiness on feet (R26.81);Pain Pain - Right/Left: Left Pain - part of body: Leg     Time: 1005-1039 PT Time Calculation (min) (ACUTE ONLY): 34 min  Charges:  $Gait Training: 8-22 mins $Therapeutic Exercise: 8-22 mins                     Ramond Dial 03/22/2018, 5:08 PM   Mee Hives, PT MS Acute Rehab Dept. Number: [place]

## 2018-03-22 NOTE — Care Management Note (Addendum)
Case Management Note  Patient Details  Name: [name] MRN: [id number] Date of Birth: 05/11/1956  Subjective/Objective:   Pt admitted with dilantin toxicity. She is from home with family. Pt states she has plenty of people at home to provide transportation. She has no issues obtaining her meds.  PCP: Dr Maudie Mercury Insurance: [employer] medicare                 Action/Plan: Recommendations are for CIR. CM following for d/c disposition.  Expected Discharge Date:                  Expected Discharge Plan:  [place]  In-House Referral:     Discharge planning Services  CM Consult  Post Acute Care Choice:    Choice offered to:     DME Arranged:    DME Agency:     HH Arranged:    [place] Agency:     Status of Service:  In process, will continue to follow  If discussed at Long Length of Stay Meetings, dates discussed:    Additional Comments:  Pollie Friar, RN 03/22/2018, 12:01 PM

## 2018-03-22 NOTE — NC FL2 (Signed)
[place] MEDICAID FL2 LEVEL OF CARE SCREENING TOOL     IDENTIFICATION  Patient Name: [name] Birthdate: [birth year] Sex: female Admission Date (Current Location): 03/19/2018  [facility] [place] Number:  Herbalist and Address:  [facility] [place], [place] [address], [place], [place] [address]      Provider Number: [id number]  Attending Physician Name and Address:  Donne Hazel, MD  Relative Name and Phone Number:       Current Level of Care: Hospital Recommended Level of Care: [place] Prior Approval Number:    Date Approved/Denied:   PASRR Number: [id number]  Discharge Plan: SNF    Current Diagnoses: Patient Active Problem List   Diagnosis Date Noted  . Orthostasis   . Tobacco abuse   . Left-sided weakness 03/19/2018  . Atypical chest pain 02/17/2017  . Localized swelling of lower extremity 02/17/2017  . GERD (gastroesophageal reflux disease) 02/17/2017  . Costochondritis 02/17/2017  . Seizures ([place]) 11/02/2015  . Gait disturbance 11/02/2015  . Right clavicle fracture 11/02/2015  . Infection of urinary tract 11/02/2015  . Chest pain 03/31/2015  . Acute on chronic respiratory failure with hypoxia ([place]) 03/31/2015  . SOB (shortness of breath) 03/31/2015  . Anxiety state 03/31/2015  . Malnutrition of moderate degree ([place]) 03/31/2015  . COPD exacerbation ([place]) 03/30/2015  . Hypoxia 01/18/2015  . Chronic obstructive pulmonary disease with acute exacerbation ([place])   . Fever 01/17/2015  . Cough 01/17/2015  . Sepsis ([place]) 01/17/2015  . COPD (chronic obstructive pulmonary disease) ([place])   . Unintentional weight loss 01/11/2015  . Asthma 05/15/2014  . Seizure ([place]) 05/15/2014  . Protein calorie malnutrition ([place]) 05/15/2014  . DVT of leg (deep venous thrombosis) ([place]) 06/19/2013  . DDD (degenerative disc disease) 06/12/2013  . Paresthesias 06/12/2013  . Tremor 06/09/2013  . Thrombocytopenia- chronic 06/29/2012   . Noncompliance with medication treatment due to underuse of medication 06/29/2012  . Stress-personal 06/29/2012  . Recurrent seizures ([place]) 06/29/2012  . Tobacco dependence 06/28/2012  . Skull fracture ([place]) 02/28/2012  . Dilantin toxicity 02/28/2012  . Seizure disorder ([place]) 02/28/2012  . History of Intracranial bleed 02/28/2012    Orientation RESPIRATION BLADDER Height & Weight     Self, Situation, Time, Place  Normal Continent Weight: 130 lb 8.2 oz (59.2 kg) Height:  5\' 7"  (170.2 cm)  BEHAVIORAL SYMPTOMS/MOOD NEUROLOGICAL BOWEL NUTRITION STATUS    Convulsions/Seizures Continent Diet(regular)  AMBULATORY STATUS COMMUNICATION OF NEEDS Skin   Limited Assist Verbally Normal                       Personal Care Assistance Level of Assistance  Bathing, Feeding, Dressing Bathing Assistance: Limited assistance Feeding assistance: Independent Dressing Assistance: Limited assistance     Functional Limitations Info  Sight, Hearing, Speech Sight Info: Impaired Hearing Info: Adequate Speech Info: Adequate    SPECIAL CARE FACTORS FREQUENCY  PT (By licensed PT), OT (By licensed OT)     PT Frequency: 5x/wk OT Frequency: 5x/wk            Contractures Contractures Info: Not present    Additional Factors Info  Code Status, Allergies Code Status Info: Full Allergies Info: Zonisamide, Codeine, Keppra Levetiracetam, Pregabalin, Olive Oil           Current Medications (03/22/2018):  This is the current hospital active medication list Current Facility-Administered Medications  Medication Dose Route Frequency Provider Last Rate Last Dose  . acetaminophen (TYLENOL)  tablet 650 mg  650 mg Oral Q4H PRN Rise Patience, MD   650 mg at 03/20/18 2107   Or  . acetaminophen (TYLENOL) solution 650 mg  650 mg Per Tube Q4H PRN Rise Patience, MD       Or  . acetaminophen (TYLENOL) suppository 650 mg  650 mg Rectal Q4H PRN Rise Patience, MD      . albuterol  (PROVENTIL) (2.5 MG/3ML) 0.083% nebulizer solution 2.5 mg  2.5 mg Inhalation Q6H PRN Rise Patience, MD      . gabapentin (NEURONTIN) capsule 100 mg  100 mg Oral q morning - 10a Vonzella Nipple, NP   100 mg at 03/22/18 0847  . gabapentin (NEURONTIN) capsule 300 mg  300 mg Oral QHS Vonzella Nipple, NP   300 mg at 03/21/18 2220  . hydrOXYzine (ATARAX/VISTARIL) tablet 10 mg  10 mg Oral TID PRN Donne Hazel, MD      . ketorolac (TORADOL) 30 MG/ML injection 30 mg  30 mg Intravenous Q6H PRN Donne Hazel, MD   30 mg at 03/21/18 1452  . lacosamide (VIMPAT) tablet 200 mg  200 mg Oral BID Rise Patience, MD   200 mg at 03/22/18 1042  . pantoprazole (PROTONIX) EC tablet 40 mg  40 mg Oral Daily Donne Hazel, MD   40 mg at 03/22/18 0853  . polyethylene glycol (MIRALAX / GLYCOLAX) packet 17 g  17 g Oral Daily Donne Hazel, MD   17 g at 03/22/18 7494     Discharge Medications: Please see discharge summary for a list of discharge medications.  Relevant Imaging Results:  Relevant Lab Results:   Additional Information SS#: [id number]  Geralynn Ochs, LCSW

## 2018-03-23 LAB — COMPREHENSIVE METABOLIC PANEL
ALBUMIN: 2.9 g/dL — AB (ref 3.5–5.0)
ALK PHOS: 40 U/L (ref 38–126)
ALT: 10 U/L (ref 0–44)
AST: 14 U/L — AB (ref 15–41)
Anion gap: 7 (ref 5–15)
BUN: 17 mg/dL (ref 6–20)
CALCIUM: 8.7 mg/dL — AB (ref 8.9–10.3)
CO2: 27 mmol/L (ref 22–32)
CREATININE: 0.74 mg/dL (ref 0.44–1.00)
Chloride: 109 mmol/L (ref 98–111)
GFR calc Af Amer: >60 mL/min (ref >60)
GFR calc non Af Amer: >60 mL/min (ref >60)
GLUCOSE: 90 mg/dL (ref 70–99)
Potassium: 4.1 mmol/L (ref 3.5–5.1)
Sodium: 143 mmol/L (ref 135–145)
Total Bilirubin: 0.4 mg/dL (ref 0.3–1.2)
Total Protein: 5.1 g/dL — ABNORMAL LOW (ref 6.5–8.1)

## 2018-03-23 LAB — PHENYTOIN LEVEL, TOTAL: Phenytoin Lvl: 9.8 ug/mL — ABNORMAL LOW (ref 10.0–20.0)

## 2018-03-23 MED ORDER — PHENYTOIN SODIUM EXTENDED 100 MG PO CAPS
200.0000 mg | ORAL_CAPSULE | Freq: Every day | ORAL | Status: DC
  Administered 2018-03-23 – 2018-03-24 (×2): 200 mg via ORAL
  Filled 2018-03-23 (×2): qty 2

## 2018-03-23 MED ORDER — PHENYTOIN SODIUM EXTENDED 100 MG PO CAPS
100.0000 mg | ORAL_CAPSULE | Freq: Every day | ORAL | Status: DC
  Administered 2018-03-23 – 2018-03-25 (×3): 100 mg via ORAL
  Filled 2018-03-23 (×3): qty 1

## 2018-03-23 NOTE — Progress Notes (Signed)
Pharmacy asked to assist with phenytoin monitoring. Patient admitted with leg numbness, weakness and drowsiness. Patient with likely phenytoin toxicity. Level on admit was 21.5 with corrected level of 28. Phenyotin held.   Level drawn this AM was 9.8 with albumin of 2.9. Corrected level is therefore 14.4 mg/dl.   Home dose of phenytoin is 160mg  (100mg  + 30mg  x 2) in the AM and 200mg  in the PM,   Plan:  1) Would recommend restarting at lower dose of 100mg  in AM and 200mg  in PM 2) Would have a level drawn in ~5-7 days after restart while at steady state.  3) F/U neurology recs.   Thank you for the consult,  Aggie Douse A. Levada Dy, PharmD, [place] Pager: [phone] Please utilize Amion for appropriate phone number to reach the unit pharmacist ([place])

## 2018-03-23 NOTE — Progress Notes (Signed)
PROGRESS NOTE    [name]  GQQ:[id number] DOB: [birth year] DOA: 03/19/2018 PCP: [name], MD    Brief Narrative:  62 y.o. female with history of seizures and asthma has been experiencing difficulty walking last 2 days with ataxia.  Also has been having numbness running from the neck down towards the left side of her body.  Denies any fall.  Denies any seizures.  ED Course: In the ER patient is a Dilaudid level is found to be elevated at around 33.8 which could explain patient's symptoms but since patient has had left-sided numbness neurologist on-call has been consulted who requested MRI brain to rule out any stroke.  Exam patient is moving all extremities with mild weakness of the left side.  Assessment & Plan:   Principal Problem:   Dilantin toxicity Active Problems:   Seizure disorder ([place])   History of Intracranial bleed   Left-sided weakness   Orthostasis   Tobacco abuse  1. Dilantin toxicity with ataxia 1. Possible ataxia from Dilantin toxicity.   2. Dilantin remains on hold, levels improving although still in the supertherapeutic range 3. Dilantin level now within therapeutic range. Consulted pharmacy for dosing 4. MRI spine reviewed, no acute process noted 5. Planning for SNF placement 6. Per pharmacy, plan to resume dilantin at lower dose with repeat level in 5-7 days then monthly until steady state 2. History of seizures on Vimpat and Dilantin.   1. Vimpat has been continued 2. Dilantin resumed at lower dose per above 3. No further seizures thus far 4. Pharmacy assisting with dilantin dosing per above 3. History of asthma 1. Currently on minimal O2 support 2. Without wheezing on exam 4. Previous history of intracranial bleed. 1. Remains stable at present. Head CT personally reviewed with Neurologist. Old changes noted 2. Remains stable at this time 5. History of DVT. 1. Stable currently 2. LE dopplers ordered by Neurology, negative for  DVT 6. Hypotension 1. Recent hypotension, improved with LR bolus 2. Will order 250cc LR bolus 3. BP now stable. Cont tele 7. Recent Chest pain 1. EKG ordered and reviewed. QTc of 439. No ischemic changes apparent 2. Have ordered troponin x 1, negative 3. Improvement noted with trial of toradol as well as protonix 4. Continue on tele given concerns of cardiotoxicity of dilantin 5. No chest pain this AM 8. LE numbness 1. Discussed with Neurology 2. Recommendations for trial of scheduled neuronin, tolerating 3. HgbA1c ordered and reviewed. Noted to be 5.1, ruled out diabetes  DVT prophylaxis: SCD's Code Status: Full Family Communication: Pt in room, family not at bedside Disposition Plan: Uncertain at this time  Consultants:   Neurology  Procedures:    Antimicrobials: Anti-infectives (From admission, onward)   None      Subjective: Reports numbness in LE  Objective: Vitals:   03/23/18 0004 03/23/18 0440 03/23/18 0850 03/23/18 1127  BP: (!) 106/54 (!) 104/52 (!) 101/47 (!) 95/57  Pulse: (!) 55 (!) 56 80 73  Resp:  18 16 18   Temp:  98.4 F (36.9 C) 98.4 F (36.9 C) 98.3 F (36.8 C)  TempSrc:  Oral Oral Oral  SpO2:  95% 97% 94%  Weight:      Height:       No intake or output data in the 24 hours ending 03/23/18 1446 Filed Weights   03/19/18 1627 03/19/18 2210  Weight: 59 kg 59.2 kg    Examination: General exam: Conversant, in no acute distress Respiratory system: normal chest rise, clear,  no audible wheezing Cardiovascular system: regular rhythm, s1-s2 Gastrointestinal system: Nondistended, nontender, pos BS Central nervous system: No seizures, no tremors Extremities: No cyanosis, no joint deformities Skin: No rashes, no pallor Psychiatry: Affect normal // no auditory hallucinations   Data Reviewed: I have personally reviewed following labs and imaging studies  CBC: Recent Labs  Lab 03/19/18 1752  WBC 3.5*  HGB 11.9*  HCT 37.4  MCV 103.3*  PLT  630*   Basic Metabolic Panel: Recent Labs  Lab 03/19/18 1752 03/20/18 0535 03/22/18 0455 03/23/18 0539  NA 142 144 143 143  K 3.6 3.6 4.2 4.1  CL 109 110 111 109  CO2 26 27 27 27   GLUCOSE 88 83 79 90  BUN 5* 6 13 17   CREATININE 0.65 0.77 0.74 0.74  CALCIUM 8.8* 8.6* 8.7* 8.7*   GFR: Estimated Creatinine Clearance: 69.9 mL/min (by C-G formula based on SCr of 0.74 mg/dL). Liver Function Tests: Recent Labs  Lab 03/20/18 0535 03/22/18 0455 03/23/18 0539  AST 16  --  14*  ALT 11  --  10  ALKPHOS 48  --  40  BILITOT 0.7  --  0.4  PROT 5.5*  --  5.1*  ALBUMIN 3.3* 3.0* 2.9*   No results for input(s): LIPASE, AMYLASE in the last 168 hours. No results for input(s): AMMONIA in the last 168 hours. Coagulation Profile: No results for input(s): INR, PROTIME in the last 168 hours. Cardiac Enzymes: Recent Labs  Lab 03/21/18 1510  TROPONINI <0.03   BNP (last 3 results) No results for input(s): PROBNP in the last 8760 hours. HbA1C: Recent Labs    03/21/18 1510  HGBA1C 5.1   CBG: Recent Labs  Lab 03/19/18 1746  GLUCAP 87   Lipid Profile: No results for input(s): CHOL, HDL, LDLCALC, TRIG, CHOLHDL, LDLDIRECT in the last 72 hours. Thyroid Function Tests: No results for input(s): TSH, T4TOTAL, FREET4, T3FREE, THYROIDAB in the last 72 hours. Anemia Panel: No results for input(s): VITAMINB12, FOLATE, FERRITIN, TIBC, IRON, RETICCTPCT in the last 72 hours. Sepsis Labs: No results for input(s): PROCALCITON, LATICACIDVEN in the last 168 hours.  Recent Results (from the past 240 hour(s))  Urine culture     Status: Abnormal   Collection Time: 03/19/18  6:48 PM  Result Value Ref Range Status   Specimen Description URINE, CLEAN CATCH  Final   Special Requests   Final    NONE Performed at [facility], [address] [place], [place] [address]    Culture MULTIPLE SPECIES PRESENT, SUGGEST RECOLLECTION (A)  Final   Report Status 03/20/2018 FINAL  Final     Radiology  Studies: No results found.  Scheduled Meds: . gabapentin  100 mg Oral q morning - 10a  . gabapentin  300 mg Oral QHS  . lacosamide  200 mg Oral BID  . pantoprazole  40 mg Oral Daily  . phenytoin  100 mg Oral Daily  . phenytoin  200 mg Oral QHS  . polyethylene glycol  17 g Oral Daily   Continuous Infusions:    LOS: 2 days   Marylu Lund, MD [facility] Pager [phone]  If 7PM-7AM, please contact night-coverage [email or web address] Password [facility] 03/23/2018, 2:46 PM

## 2018-03-24 ENCOUNTER — Inpatient Hospital Stay (HOSPITAL_COMMUNITY): Payer: Medicare HMO

## 2018-03-24 DIAGNOSIS — T420X1A Poisoning by hydantoin derivatives, accidental (unintentional), initial encounter: Secondary | ICD-10-CM

## 2018-03-24 DIAGNOSIS — R270 Ataxia, unspecified: Principal | ICD-10-CM

## 2018-03-24 LAB — CBC
HEMATOCRIT: 40.2 % (ref 36.0–46.0)
Hemoglobin: 13 g/dL (ref 12.0–15.0)
MCH: 33.4 pg (ref 26.0–34.0)
MCHC: 32.3 g/dL (ref 30.0–36.0)
MCV: 103.3 fL — AB (ref 78.0–100.0)
Platelets: 142 10*3/uL — ABNORMAL LOW (ref 150–400)
RBC: 3.89 MIL/uL (ref 3.87–5.11)
RDW: 12.7 % (ref 11.5–15.5)
WBC: 4.4 10*3/uL (ref 4.0–10.5)

## 2018-03-24 LAB — TSH: TSH: 1.647 u[IU]/mL (ref 0.350–4.500)

## 2018-03-24 MED ORDER — SODIUM CHLORIDE 0.9 % IV SOLN
INTRAVENOUS | Status: DC
  Administered 2018-03-24 – 2018-03-25 (×2): via INTRAVENOUS

## 2018-03-24 MED ORDER — VITAMIN B-12 1000 MCG PO TABS
1000.0000 ug | ORAL_TABLET | Freq: Every day | ORAL | Status: DC
  Administered 2018-03-24 – 2018-03-25 (×2): 1000 ug via ORAL
  Filled 2018-03-24 (×2): qty 1

## 2018-03-24 NOTE — Care Management Important Message (Signed)
Important Message  Patient Details  Name: [name] MRN: [id number] Date of Birth: 01/08/1956   Medicare Important Message Given:  Yes    Yeng Perz 03/24/2018, 1:28 PM

## 2018-03-24 NOTE — Progress Notes (Signed)
PROGRESS NOTE  [name] ZLD:[id number] DOB: 28-Mar-1956 DOA: 03/19/2018 PCP: [name], MD  HPI/Recap of past 24 hours:  sbp in the 80's, she reports feeling dizzy standing up She continue to c/o bilateral lower extremity pain Continue to have ataxia   Assessment/Plan: Principal Problem:   Dilantin toxicity Active Problems:   Seizure disorder ([place])   History of Intracranial bleed   Left-sided weakness   Orthostasis   Tobacco abuse   Recurrent dilantin toxicity with ataxia -per chart review she was hospitalized multiple time in the past due to same reasone -dilantin level supertherapeutic on presentation, -Dilantin held initially,  -Per pharmacy,  dilantin resumed on 8/20 at lower dose ( 100mg  qam and 200mg  qpm) with repeat level in 5-7 days then monthly until steady state,  -also need to monitor albumin level -neurology consulted, home meds vimpat continued at home dose, dilantin management as above -patient is advised to follow up with her neurologist closely, ( per chart review, she last saw her neurology in 2017) -will need to set up home health RN for meds supervision, home PT/OT  Bilateral lower extremity burning pain, able to bear weight, no bowel or bladder incontinence, denies back pain -mri lumber spine no acute findings, Chronic borderline to mild multifactorial spinal stenosis L2-L3 through L4-L5 with no convincing lateral recess or foraminal stenosis. -Venous doppler no DVT's -good peripheral pulses -neurology recommended neurontin and titrate up if able to tolerate  Low normal bp, no fever, no leukocytosis, no fever, mild orthostasis Initial cxr with volume loss right middle lobe , therefore CT chest ordered today Chest ct  "1. Almost complete atelectasis of the right middle lobe without a visible bronchial lesion. This could be secondary to aspiration. 2. Emphysema. 3. Aortic atherosclerosis.  H/o seizures: Presented with dilantin toxicity Resume  dilantin at lower doses, continue vimpat Follow up with neurology  H/o intracranial bleed H/o DVT Ct head no acute bleed Venous doppler no DVT Stable, not on anticoagulation  Low b12, start b12 supplement  thrombocytopenia Mild, chronic  plt 135-142, close to baseline Continue b12 supplement    Code Status: full  Family Communication: patient   Disposition Plan:  Tele has been stable, d/c tele home with home health PT/OT/RN for meds supervision , hopefully on 8/22   Consultants:  Neurology  CIR  Procedures:  none  Antibiotics:  none   Objective: BP (!) 80/41 (BP Location: Right Arm)   Pulse 65   Temp 97.9 F (36.6 C) (Oral)   Resp 18   Ht 5\' 7"  (1.702 m)   Wt 59.2 kg   SpO2 98%   BMI 20.44 kg/m   Intake/Output Summary (Last 24 hours) at 03/24/2018 1234 Last data filed at 03/23/2018 1800 Gross per 24 hour  Intake 240 ml  Output -  Net 240 ml   Filed Weights   03/19/18 1627 03/19/18 2210  Weight: 59 kg 59.2 kg    Exam: Patient is examined daily including today on 03/24/2018, exams remain the same as of yesterday except that has changed    General:  NAD, some mild ataxia   Cardiovascular: RRR  Respiratory: CTABL  Abdomen: Soft/ND/NT, positive BS  Musculoskeletal: No Edema  Neuro: alert, oriented   Data Reviewed: Basic Metabolic Panel: Recent Labs  Lab 03/19/18 1752 03/20/18 0535 03/22/18 0455 03/23/18 0539  NA 142 144 143 143  K 3.6 3.6 4.2 4.1  CL 109 110 111 109  CO2 26 27 27 27   GLUCOSE 88  83 79 90  BUN 5* 6 13 17   CREATININE 0.65 0.77 0.74 0.74  CALCIUM 8.8* 8.6* 8.7* 8.7*   Liver Function Tests: Recent Labs  Lab 03/20/18 0535 03/22/18 0455 03/23/18 0539  AST 16  --  14*  ALT 11  --  10  ALKPHOS 48  --  40  BILITOT 0.7  --  0.4  PROT 5.5*  --  5.1*  ALBUMIN 3.3* 3.0* 2.9*   No results for input(s): LIPASE, AMYLASE in the last 168 hours. No results for input(s): AMMONIA in the last 168 hours. CBC: Recent  Labs  Lab 03/19/18 1752  WBC 3.5*  HGB 11.9*  HCT 37.4  MCV 103.3*  PLT 135*   Cardiac Enzymes:   Recent Labs  Lab 03/21/18 1510  TROPONINI <0.03   BNP (last 3 results) No results for input(s): BNP in the last 8760 hours.  ProBNP (last 3 results) No results for input(s): PROBNP in the last 8760 hours.  CBG: Recent Labs  Lab 03/19/18 1746  GLUCAP 87    Recent Results (from the past 240 hour(s))  Urine culture     Status: Abnormal   Collection Time: 03/19/18  6:48 PM  Result Value Ref Range Status   Specimen Description URINE, CLEAN CATCH  Final   Special Requests   Final    NONE Performed at [facility], [place] [address], [place], [place] [address]    Culture MULTIPLE SPECIES PRESENT, SUGGEST RECOLLECTION (A)  Final   Report Status 03/20/2018 FINAL  Final     Studies: No results found.  Scheduled Meds: . gabapentin  100 mg Oral q morning - 10a  . gabapentin  300 mg Oral QHS  . lacosamide  200 mg Oral BID  . pantoprazole  40 mg Oral Daily  . phenytoin  100 mg Oral Daily  . phenytoin  200 mg Oral QHS  . polyethylene glycol  17 g Oral Daily    Continuous Infusions:   Time spent: 83mins I have personally reviewed and interpreted on  03/24/2018 daily labs, tele strips, imagings as discussed above under date review session and assessment and plans.  I reviewed all nursing notes, pharmacy notes, consultant notes,  vitals, pertinent old records  I have discussed plan of care as described above with RN , patient and family on 03/24/2018   [name] MD, PhD  [facility] Pager [phone]. If 7PM-7AM, please contact night-coverage at [email or web address], password [facility] 03/24/2018, 12:34 PM  LOS: 3 days

## 2018-03-24 NOTE — Progress Notes (Signed)
Physical Therapy Treatment Patient Details Name: [name] MRN: [id number] DOB: March 14, 1956 Today's Date: 03/24/2018    History of Present Illness [name] is an 62 y.o. female past medical history of ICH, seizures, DVT chronic back pain, COPD presents to the emergency room for left leg weakness, drowsiness in the morning of 8/16.  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     PT Comments    [name] doing well today - reports good motivation to work with PT as she is very motivated to return home. Patient ambulating progressive distances in hallway with RW, but reports B foot pain and dizziness limiting her. Updated PT recs.     Follow Up Recommendations  Supervision for mobility/OOB;Home health PT     Equipment Recommendations  Rolling walker with 5" wheels    Recommendations for Other Services Rehab consult;OT consult     Precautions / Restrictions Precautions Precautions: Fall Restrictions Weight Bearing Restrictions: No    Mobility  Bed Mobility Overal bed mobility: Needs Assistance Bed Mobility: Supine to Sit     Supine to sit: Supervision     General bed mobility comments: up in recliner  Transfers Overall transfer level: Needs assistance Equipment used: Rolling walker (2 wheeled) Transfers: Sit to/from Stand Sit to Stand: Min guard         General transfer comment: for safety and immediate standing balance  Ambulation/Gait Ambulation/Gait assistance: Min guard Gait Distance (Feet): 80 Feet Assistive device: Rolling walker (2 wheeled) Gait Pattern/deviations: Wide base of support;Ataxic;Decreased stride length;Step-through pattern Gait velocity: decreased   General Gait Details: instability throughout; requires ~6 standing rest breaks; complaints of dizziness and B foot pain throughout   Stairs             Wheelchair Mobility    Modified Rankin  (Stroke Patients Only)       Balance Overall balance assessment: Needs assistance Sitting-balance support: No upper extremity supported;Feet supported Sitting balance-Leahy Scale: Good     Standing balance support: Bilateral upper extremity supported;During functional activity Standing balance-Leahy Scale: Poor Standing balance comment: fatigues quickly                            Cognition Arousal/Alertness: Awake/alert Behavior During Therapy: Flat affect Overall Cognitive Status: Impaired/Different from baseline Area of Impairment: Safety/judgement                         Safety/Judgement: Decreased awareness of deficits;Decreased awareness of safety     General Comments: patient continuously speak of past medical issues as well as concern about BP      Exercises      General Comments General comments (skin integrity, edema, etc.): patient continuously reports B foot pain - feels like she is walking on rocks; speaks of past medical/social history      Pertinent Vitals/Pain Pain Assessment: 0-10 Pain Score: 10-Worst pain ever Pain Location: Bil LE - especially her feet Pain Descriptors / Indicators: Aching;Discomfort;Nagging Pain Intervention(s): Limited activity within patient's tolerance;Monitored during session;Patient requesting pain meds-RN notified    Home Living                      Prior Function            PT Goals (current goals can now be found in the care plan section)  Acute Rehab PT Goals Patient Stated Goal: get back to independent PT Goal Formulation: With patient Time For Goal Achievement: 04/03/18 Potential to Achieve Goals: Good Progress towards PT goals: Progressing toward goals    Frequency    Min 3X/week      PT Plan Current plan remains appropriate    Co-evaluation              AM-PAC PT "6 Clicks" Daily Activity  Outcome Measure  Difficulty turning over in bed (including adjusting  bedclothes, sheets and blankets)?: A Little Difficulty moving from lying on back to sitting on the side of the bed? : A Little Difficulty sitting down on and standing up from a chair with arms (e.g., wheelchair, bedside commode, etc,.)?: Unable Help needed moving to and from a bed to chair (including a wheelchair)?: A Little Help needed walking in hospital room?: A Little Help needed climbing 3-5 steps with a railing? : A Lot 6 Click Score: 15    End of Session Equipment Utilized During Treatment: Gait belt Activity Tolerance: Patient tolerated treatment well Patient left: in chair;with call bell/phone within reach Nurse Communication: Mobility status PT Visit Diagnosis: Other abnormalities of gait and mobility (R26.89);Ataxic gait (R26.0);Unsteadiness on feet (R26.81);Pain Pain - Right/Left: Left Pain - part of body: Leg     Time: 2902-1115 PT Time Calculation (min) (ACUTE ONLY): 15 min  Charges:  $Gait Training: 8-22 mins                    Lanney Gins, PT, DPT 03/24/18 4:26 PM  Pager: [phone]

## 2018-03-24 NOTE — Progress Notes (Signed)
CSW alerted by team that recommendations have been updated and the patient would like to go home. No SNF placement needed at this time.  CSW signing off.  Laveda Abbe, [place] Clinical Social Worker [phone]

## 2018-03-25 ENCOUNTER — Inpatient Hospital Stay (HOSPITAL_COMMUNITY): Payer: Medicare HMO

## 2018-03-25 DIAGNOSIS — R627 Adult failure to thrive: Secondary | ICD-10-CM

## 2018-03-25 LAB — COMPREHENSIVE METABOLIC PANEL
ALBUMIN: 3 g/dL — AB (ref 3.5–5.0)
ALK PHOS: 42 U/L (ref 38–126)
ALT: 12 U/L (ref 0–44)
ANION GAP: 4 — AB (ref 5–15)
AST: 15 U/L (ref 15–41)
BILIRUBIN TOTAL: 0.3 mg/dL (ref 0.3–1.2)
BUN: 18 mg/dL (ref 6–20)
CALCIUM: 8.4 mg/dL — AB (ref 8.9–10.3)
CO2: 26 mmol/L (ref 22–32)
Chloride: 112 mmol/L — ABNORMAL HIGH (ref 98–111)
Creatinine, Ser: 0.71 mg/dL (ref 0.44–1.00)
GFR calc Af Amer: >60 mL/min (ref >60)
GFR calc non Af Amer: >60 mL/min (ref >60)
GLUCOSE: 87 mg/dL (ref 70–99)
Potassium: 3.9 mmol/L (ref 3.5–5.1)
Sodium: 142 mmol/L (ref 135–145)
TOTAL PROTEIN: 5 g/dL — AB (ref 6.5–8.1)

## 2018-03-25 LAB — CBC
HEMATOCRIT: 37.7 % (ref 36.0–46.0)
HEMOGLOBIN: 12.1 g/dL (ref 12.0–15.0)
MCH: 33.4 pg (ref 26.0–34.0)
MCHC: 32.1 g/dL (ref 30.0–36.0)
MCV: 104.1 fL — ABNORMAL HIGH (ref 78.0–100.0)
Platelets: 115 10*3/uL — ABNORMAL LOW (ref 150–400)
RBC: 3.62 MIL/uL — ABNORMAL LOW (ref 3.87–5.11)
RDW: 12.7 % (ref 11.5–15.5)
WBC: 4.4 10*3/uL (ref 4.0–10.5)

## 2018-03-25 LAB — CORTISOL: Cortisol, Plasma: 14.6 ug/dL

## 2018-03-25 LAB — MAGNESIUM: Magnesium: 2 mg/dL (ref 1.7–2.4)

## 2018-03-25 MED ORDER — MORPHINE SULFATE (PF) 2 MG/ML IV SOLN: 2.0000 mg | Freq: Once | INTRAVENOUS | Status: DC

## 2018-03-25 MED ORDER — GABAPENTIN 300 MG PO CAPS: 300.0000 mg | ORAL_CAPSULE | Freq: Every day | ORAL | 0 refills | Status: DC

## 2018-03-25 MED ORDER — VITAMIN B-12 1000 MCG PO TABS: 1000.0000 ug | ORAL_TABLET | Freq: Every day | ORAL | 0 refills | Status: DC

## 2018-03-25 MED ORDER — PHENYTOIN SODIUM EXTENDED 100 MG PO CAPS: 100.0000 mg | ORAL_CAPSULE | Freq: Every day | ORAL | 0 refills | Status: DC

## 2018-03-25 MED ORDER — PHENYTOIN SODIUM EXTENDED 200 MG PO CAPS: 200.0000 mg | ORAL_CAPSULE | Freq: Every day | ORAL | 0 refills | Status: DC

## 2018-03-25 NOTE — Consult Note (Signed)
            [facility] CM Primary Care Navigator  03/25/2018  [name] [birth year] [id number]   Went to seepatient at the bedside to identify possible discharge needs but she wasalreadydischarged per staff.  Patient was discharge home with set up home health RN for medication supervision (related to recurrent Dilantin toxicity) and home health PT/ OT.  Per MD note,patientpresented with difficulty walking with ataxia and having numbness running from the neck down towards the left side of her body. (Dilantin toxicity- recurrent with ataxia)  Primary care provider's office is listed as providing transition of care (TOC) follow-up.   Patient has discharge instruction to follow-up withprimary care provider and neurology in 1 week.  Primary care provider's office called Lelon Frohlich) to notify of patient's discharge, need for post hospital follow-up and transition of care (TOC). Notified of health issues needing close follow-up.  Made aware to refer patient to [facility] care management if deemed necessary and appropriate for services.   For additional questions please contact:  Edwena Felty A. Tywaun Hiltner, BSN, RN-BC [facility] PRIMARY CARE Navigator Cell: [phone]

## 2018-03-25 NOTE — Progress Notes (Signed)
PT C/O right sided chest pain and tightness on inspiration, paged [facility] for advisement. Awaiting response. [name]

## 2018-03-25 NOTE — Progress Notes (Signed)
Late Entry: Did not Populate Note, Clinical Impression from session 03/24/18  Clinical Impression: Pt progressing towards OT goals this session. Pt with little safety awareness, perseverating on medicine management and pain in feet feeling like "rocks" Pt able to perform toilet transfer with RW, sink level grooming with multiple seated rest breaks for pain in BLE. Pt continues to benefit from skilled OT in the acute setting, and afterwards at [facility] level.     03/24/18 1400  OT Visit Information  Last OT Received On 03/24/18  Assistance Needed +1  History of Present Illness [name] is an 62 y.o. female past medical history of ICH, seizures, DVT chronic back pain, COPD presents to the emergency room for left leg weakness, drowsiness in the morning of 8/16.  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   Precautions  Precautions Fall  Pain Assessment  Pain Assessment 0-10  Pain Score 10  Pain Location Bil LE - especially her feet  Pain Descriptors / Indicators Constant;Heaviness;Radiating  Pain Intervention(s) Limited activity within patient's tolerance;Monitored during session;Repositioned  Cognition  Arousal/Alertness Awake/alert  Behavior During Therapy Flat affect  Overall Cognitive Status Impaired/Different from baseline  Area of Impairment Safety/judgement  Safety/Judgement Decreased awareness of deficits;Decreased awareness of safety  General Comments Pt perseverating on medications despite therapist stating that I cannot address her needs as far as medicine. LOB x2 able to self correct, tangential in thoughts  Upper Extremity Assessment  Upper Extremity Assessment LUE deficits/detail  LUE Deficits / Details slightly ataxic with finger to nose, discoordinated movement with supination/pronation, increased time and effort for all movement  LUE Sensation decreased light touch;decreased proprioception   LUE Coordination decreased fine motor;decreased gross motor (increased time and effort)  ADL  Overall ADL's  Needs assistance/impaired  Grooming Wash/dry hands;Wash/dry face;Oral care;Min guard;Standing  Grooming Details (indicate cue type and reason) 2 seated rest breaks due to pain in BLE  Lower Body Dressing Modified independent;Bed level  Lower Body Dressing Details (indicate cue type and reason) able to don socks in bed without problem fine motor or flexibility  Toilet Transfer Min guard;RW;Ambulation  Toilet Transfer Details (indicate cue type and reason) dependent on RW  Functional mobility during ADLs Min guard;Rolling walker  General ADL Comments Pt with poor awareness of need for RW given her L LE numbness and fall risk, educated.  Bed Mobility  Overal bed mobility Needs Assistance  Bed Mobility Supine to Sit  Supine to sit Supervision  General bed mobility comments for safety  Balance  Overall balance assessment Needs assistance  Sitting-balance support No upper extremity supported;Feet supported  Sitting balance-Leahy Scale Good  Standing balance support Bilateral upper extremity supported  Standing balance-Leahy Scale Poor  Standing balance comment fatigues quickly  Restrictions  Weight Bearing Restrictions No  Transfers  Overall transfer level Needs assistance  Equipment used Rolling walker (2 wheeled)  Transfers Sit to/from Stand  Sit to Stand Min guard  General transfer comment no physical assist, vc for safe hand placement  General Comments  General comments (skin integrity, edema, etc.) Pt is fixated on BLE pain "I feel like I have rocks for feet" and medication management "Why would they put me back on that if it caused all these things?" OT deferred medication questions and told her to ask MD when they came around.   OT - End of Session  Equipment Utilized During Treatment Gait belt;Rolling walker  Activity Tolerance  Patient tolerated treatment well  Patient  left in chair;with call bell/phone within reach;with chair alarm set  Nurse Communication Mobility status  OT Assessment/Plan  OT Plan Discharge plan remains appropriate;Frequency needs to be updated  OT Visit Diagnosis Unsteadiness on feet (R26.81);Other abnormalities of gait and mobility (R26.89);Muscle weakness (generalized) (M62.81);Ataxia, unspecified (R27.0)  OT Frequency (ACUTE ONLY) Min 2X/week  Follow Up Recommendations Home health OT;Supervision/Assistance - 24 hour  OT Equipment 3 in 1 bedside commode;Other (comment) (RW)  AM-PAC OT "6 Clicks" Daily Activity Outcome Measure  Help from another person eating meals? 4  Help from another person taking care of personal grooming? 3  Help from another person toileting, which includes using toliet, bedpan, or urinal? 3  Help from another person bathing (including washing, rinsing, drying)? 3  Help from another person to put on and taking off regular upper body clothing? 4  Help from another person to put on and taking off regular lower body clothing? 3  6 Click Score 20  ADL G Code Conversion CJ  OT Goal Progression  Progress towards OT goals Progressing toward goals  Acute Rehab OT Goals  Patient Stated Goal get back to independent  OT Goal Formulation With patient  Time For Goal Achievement 04/03/18  Potential to Achieve Goals Good  OT Time Calculation  OT Start Time (ACUTE ONLY) 1102  OT Stop Time (ACUTE ONLY) 1129  OT Time Calculation (min) 27 min  OT General Charges  $OT Visit 1 Visit  OT Treatments  $Self Care/Home Management  23-37 mins   Hulda Humphrey OTR/L [phone]

## 2018-03-25 NOTE — Progress Notes (Signed)
OOB TO AMBULATE PER WALKER WITH MOD ASSIST, SAT>=92% ON RA.

## 2018-03-25 NOTE — Progress Notes (Signed)
Modified Barium Swallow Progress Note  Patient Details  Name: [name] MRN: [id number] Date of Birth: 06-06-1956  Today's Date: 03/25/2018  Modified Barium Swallow completed.  Full report located under Chart Review in the Imaging Section.  Brief recommendations include the following:  Clinical Impression  Pt exhibited mild oral deficits marked by decreased bolus cohesion with premature spill of thin into pyriform sinuses and barium falling sublingually during attempts to transit thin and barium pill. Lingual residue spilled to valleculae and pyriform sinuses and cleared with following trial of thin. Swallow initiation, laryngeal elevation and epiglottic inversion adequate to prevent barium intrusion into laryngeal vestibule during study. Esophageal scan, which does not diagnose below the level of the pharyngoesophageal segment, did not reveal overt abnormalitites during brief view. Recommend continue regular texture, thin liquids, pills with thin (barium pill not coated and can be more difficult to transit), rest breaks if needed.    Swallow Evaluation Recommendations       SLP Diet Recommendations: Regular solids;Thin liquid   Liquid Administration via: Cup;Straw   Medication Administration: Whole meds with liquid   Supervision: Patient able to self feed   Compensations: Slow rate;Small sips/bites   Postural Changes: Seated upright at 90 degrees;Remain semi-upright after after feeds/meals (Comment)   Oral Care Recommendations: Oral care BID        [name] 03/25/2018,11:48 AM   [name].Ed [employer] [phone]

## 2018-03-25 NOTE — Progress Notes (Signed)
Discharge instructions (including medications) discussed with and copy provided to patient/caregiver 

## 2018-03-25 NOTE — Care Management Note (Signed)
Case Management Note  Patient Details  Name: [name] MRN: [id number] Date of Birth: [birth year]  Subjective/Objective:                    Action/Plan: Pt discharging home with orders for [facility] services. CM provided choice and she selected [facility]. Cory with [facility] notified and accepted the referral. Also [facility] to set up program for medication management.  Pt with orders for walker. James with [facility] DME notified and will deliver to the room.  Pts daughter to provide transportation home.   Expected Discharge Date:  03/25/18               Expected Discharge Plan:  [place]  In-House Referral:     Discharge planning Services  CM Consult  Post Acute Care Choice:  Home Health, Durable Medical Equipment Choice offered to:  Patient  DME Arranged:  Walker rolling DME Agency:  [place] Arranged:  PT, OT, RN [facility] Agency:  [place]  Status of Service:  Completed, signed off  If discussed at [place] of Stay Meetings, dates discussed:    Additional Comments:  [name] Friar, RN 03/25/2018, 11:42 AM

## 2018-03-25 NOTE — Evaluation (Signed)
Clinical/Bedside Swallow Evaluation Patient Details  Name: [name] MRN: [id number] Date of Birth: [birth year]  Today's Date: 03/25/2018 Time: SLP Start Time (ACUTE ONLY): 0902 SLP Stop Time (ACUTE ONLY): 0920 SLP Time Calculation (min) (ACUTE ONLY): 18 min  Past Medical History:  Past Medical History:  Diagnosis Date  . Asthma   . Cerebral hemorrhage ([place]) 1989  . Chronic back pain   . COPD (chronic obstructive pulmonary disease) ([place])   . DDD (degenerative disc disease) 06/12/2013  . DVT (deep venous thrombosis) ([place])    "she's had several since 1990"  . GERD (gastroesophageal reflux disease)   . Headache    "usually around time when she's had a seizure"  . History of blood transfusion    "when she was a baby"  . History of stomach ulcers   . Kidney stones   . Seizures ([place])    "because of her brain surgery"  . Stroke [facility]) [phone]   family denies residual on 11/09/2014  . Tunnel vision    "since brain OR"   Past Surgical History:  Past Surgical History:  Procedure Laterality Date  . [place]   craniotomy with hematoma evacuation  . CESAREAN SECTION  1979; 1987; 1989  . HEMORRHOID SURGERY    . TUBAL LIGATION  1990's   HPI:  [name] is an 62 y.o. female past medical history of GERD, ICH (1990's), seizures, DV,T chronic back pain, COPD presents to the emergency room for left leg weakness, drowsiness in the morning of 8/16.  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 CXR Almost complete atelectasis of the right middle lobe without a   Assessment / Plan / Recommendation Clinical Impression  Pt did not demonstrate indications of overt decreased airway protection however unable to consume 3 oz water in succession suspected due to history of COPD. Pt reports of pharyngeal globus senation and frequent throat clearing. Pt stated "I'm losing my air." Given CXR results  suspecting possible pna, COPD increasing risk for silent aspiration and GERD, recommend MBS scheduled today at 10:30 to fully assess.  SLP Visit Diagnosis: Dysphagia, unspecified (R13.10)    Aspiration Risk  Mild aspiration risk;Moderate aspiration risk    Diet Recommendation Regular;Thin liquid   Liquid Administration via: Cup;Straw Medication Administration: Whole meds with liquid Supervision: Patient able to self feed Compensations: Slow rate;Small sips/bites Postural Changes: Seated upright at 90 degrees    Other  Recommendations     Follow up Recommendations        Frequency and Duration            Prognosis        Swallow Study   General Date of Onset: 03/19/18 HPI: [name] is an 62 y.o. female past medical history of GERD, ICH (1990's), seizures, DV,T chronic back pain, COPD presents to the emergency room for left leg weakness, drowsiness in the morning of 8/16.  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 CXR Almost complete atelectasis of the right middle lobe without a Type of Study: Bedside Swallow Evaluation Previous Swallow Assessment: (none) Diet Prior to this Study: Regular;Thin liquids Temperature Spikes Noted: No Respiratory Status: Room air History of Recent Intubation: No Behavior/Cognition: Alert;Cooperative;Pleasant mood Oral Cavity Assessment: Within Functional Limits Oral Care Completed by SLP: No Oral Cavity - Dentition: Dentures, top;Other (Comment)(lower  plate ill fitting) Vision: Functional for self-feeding Self-Feeding Abilities: Able to feed self Patient Positioning: Upright in bed Baseline Vocal Quality: Normal Volitional Cough: Strong Volitional Swallow: Able to elicit    Oral/Motor/Sensory Function Overall Oral Motor/Sensory Function: Within functional limits   Ice Chips Ice chips: Not tested   Thin Liquid Thin Liquid: Within functional  limits Presentation: Straw;Cup    Nectar Thick Nectar Thick Liquid: Not tested   Honey Thick Honey Thick Liquid: Not tested   Puree Puree: Not tested   Solid     Solid: Within functional limits Presentation: Self Fed      Houston Siren 03/25/2018,9:50 AM   Orbie Pyo Colvin Caroli.Ed [employer] [phone]

## 2018-03-25 NOTE — Discharge Summary (Signed)
Discharge Summary  [name] YTK:[id number] DOB: February 24, 1956  PCP: [name] Gravel, MD  Admit date: 03/19/2018 Discharge date: 03/25/2018  Time spent: 19mins, more than 50% time spent on coordination of care  Recommendations for Outpatient Follow-up:  1. F/u with PMD within a week  for hospital discharge follow up, repeat cbc/bmp at follow up. pcp to repeat cxr in 3-4 weeks 2. F/u with neurology Dr [name] 3. Home health arranged  Discharge Diagnoses:  Active Hospital Problems   Diagnosis Date Noted  . Dilantin toxicity 02/28/2012  . Orthostasis   . Tobacco abuse   . Left-sided weakness 03/19/2018  . Seizure disorder ([place]) 02/28/2012  . History of Intracranial bleed 02/28/2012    Resolved Hospital Problems  No resolved problems to display.    Discharge Condition: stable  Diet recommendation: regular diet  Filed Weights   03/19/18 1627 03/19/18 2210  Weight: 59 kg 59.2 kg    History of present illness: (per admitting MD Dr Hal Hope) PCP: [name] Gravel, MD  Patient coming from: Home.  Chief Complaint: Difficulty walking.  HPI: [name] is a 62 y.o. female with history of seizures and asthma has been experiencing difficulty walking last 2 days with ataxia.  Also has been having numbness running from the neck down towards the left side of her body.  Denies any fall.  Denies any seizures.  ED Course: In the ER patient is a Dilaudid level is found to be elevated at around 33.8 which could explain patient's symptoms but since patient has had left-sided numbness neurologist on-call has been consulted who requested MRI brain to rule out any stroke.  Exam patient is moving all extremities with mild weakness of the left side.  Hospital Course:  Principal Problem:   Dilantin toxicity Active Problems:   Seizure disorder ([place])   History of Intracranial bleed   Left-sided weakness   Orthostasis   Tobacco abuse  Recurrent dilantin toxicity with ataxia -per chart review  she was hospitalized multiple time in the past due to same reasone -dilantin level supertherapeutic on presentation, dilantin level 33.8 on 8/16 -Dilantin held initially, dilantin level coming down to 9.8 on 8/20 -Per pharmacy,  dilantin resumed on 8/20 at lower dose ( 100mg  qam and 200mg  qpm) with repeat level in 5-7 days then monthly until steady state,  -also need to monitor albumin level -neurology consulted, home meds vimpat continued at home dose, dilantin management as above -patient is advised to follow up with her neurologist closely, ( per chart review, she last saw her neurology in 2017) -set up home health RN for meds supervision, home PT/OT  Bilateral lower extremity burning pain, able to bear weight, no bowel or bladder incontinence, denies back pain -mri lumber spine no acute findings, Chronic borderline to mild multifactorial spinal stenosis L2-L3 through L4-L5 with no convincing lateral recess or foraminal stenosis. -Venous doppler no DVT's -good peripheral pulses -neurology recommended neurontin and titrate up if able to tolerate  Low normal bp, no fever, no leukocytosis, no fever, mild orthostasis Initial cxr with volume loss right middle lobe , therefore CT chest ordered Chest ct  "1. Almost complete atelectasis of the right middle lobe without a visible bronchial lesion. This could be secondary to aspiration. 2. Emphysema. 3. Aortic atherosclerosis.  She received gentle hydration Incentive spirometer, encourage ambulation, she passed swallow eval. pcp to repeat cxr two view in 3-4 weeks  H/o seizures: Presented with dilantin toxicity Resume dilantin at lower doses, continue vimpat Follow up with  neurology  H/o intracranial bleed H/o DVT Ct head no acute bleed Venous doppler no DVT Stable, not on anticoagulation  Low b12, start b12 supplement  thrombocytopenia Mild, chronic  plt 135-142, close to baseline Continue b12 supplement    Code  Status: full  Family Communication: patient   Disposition Plan:   home with home health PT/OT/RN for meds supervision  on 8/22   Consultants:  Neurology  CIR  Procedures:  none  Antibiotics:  none   Discharge Exam: BP (!) 108/45 (BP Location: Left Arm)   Pulse 73   Temp 97.8 F (36.6 C) (Oral)   Resp 18   Ht 5\' 7"  (1.702 m)   Wt 59.2 kg   SpO2 100%   BMI 20.44 kg/m   General: NAD Cardiovascular: RRR Respiratory: CTABL  Discharge Instructions You were cared for by a hospitalist during your hospital stay. If you have any questions about your discharge medications or the care you received while you were in the hospital after you are discharged, you can call the unit and asked to speak with the hospitalist on call if the hospitalist that took care of you is not available. Once you are discharged, your primary care physician will handle any further medical issues. Please note that NO REFILLS for any discharge medications will be authorized once you are discharged, as it is imperative that you return to your primary care physician (or establish a relationship with a primary care physician if you do not have one) for your aftercare needs so that they can reassess your need for medications and monitor your lab values.  Discharge Instructions    Diet general   Complete by:  As directed    SLP Diet Recommendations: Regular solids;Thin liquid   Liquid Administration via: Cup;Straw   Medication Administration: Whole meds with liquid   Supervision: Patient able to self feed   Compensations: Slow rate;Small sips/bites   Postural Changes: Seated upright at 90 degrees;Remain semi-upright after after feeds/meals (Comment)   Oral Care Recommendations: Oral care BID   Face-to-face encounter (required for Medicare/Medicaid patients)   Complete by:  As directed    I [name] certify that this patient is under my care and that I, or a nurse practitioner or  physician's assistant working with me, had a face-to-face encounter that meets the physician face-to-face encounter requirements with this patient on 03/24/2018. The encounter with the patient was in whole, or in part for the following medical condition(s) which is the primary reason for home health care (List medical condition): poor memory, h/o repeated dilantin toxicity/supratherapeutic dilantin level, needs RN for meds assistance/supervision.   The encounter with the patient was in whole, or in part, for the following medical condition, which is the primary reason for home health care:  FTT   I certify that, based on my findings, the following services are medically necessary home health services:  Nursing   Reason for Medically Necessary Home Health Services:  Skilled Nursing- Change/Decline in Patient Status   My clinical findings support the need for the above services:  OTHER SEE COMMENTS   Further, I certify that my clinical findings support that this patient is homebound due to:  Mental confusion   Home Health   Complete by:  As directed    To provide the following care/treatments:  RN   Increase activity slowly   Complete by:  As directed      Allergies as of 03/25/2018      Reactions  Zonisamide Other (See Comments)   Numbness and tingling over whole body   Codeine Nausea And Vomiting   Keppra [levetiracetam] Other (See Comments)   Causes seizures.    Pregabalin Nausea And Vomiting   Olive Oil Rash      Medication List    STOP taking these medications   pantoprazole 40 MG tablet Commonly known as:  PROTONIX     TAKE these medications   acetaminophen 325 MG tablet Commonly known as:  TYLENOL Take 650 mg by mouth daily as needed for headache.   albuterol 108 (90 Base) MCG/ACT inhaler Commonly known as:  PROVENTIL HFA;VENTOLIN HFA Inhale 2 puffs into the lungs every 6 (six) hours as needed for wheezing or shortness of breath.   gabapentin 300 MG capsule Commonly known  as:  NEURONTIN Take 1 capsule (300 mg total) by mouth at bedtime.   lacosamide 200 MG Tabs tablet Commonly known as:  VIMPAT Take 200 mg by mouth 2 (two) times daily.   phenytoin 200 MG ER capsule Commonly known as:  DILANTIN Take 1 capsule (200 mg total) by mouth at bedtime. What changed:    medication strength  how much to take  when to take this  additional instructions   phenytoin 100 MG ER capsule Commonly known as:  DILANTIN Take 1 capsule (100 mg total) by mouth daily. Start taking on:  03/26/2018 What changed:    how much to take  when to take this   vitamin B-12 1000 MCG tablet Commonly known as:  CYANOCOBALAMIN Take 1 tablet (1,000 mcg total) by mouth daily.            Durable Medical Equipment  (From admission, onward)         Start     Ordered   03/25/18 1141  For home use only DME Walker rolling  Once    Question:  Patient needs a walker to treat with the following condition  Answer:  Generalized weakness   03/25/18 1140         Allergies  Allergen Reactions  . Zonisamide Other (See Comments)    Numbness and tingling over whole body  . Codeine Nausea And Vomiting  . Keppra [Levetiracetam] Other (See Comments)    Causes seizures.   . Pregabalin Nausea And Vomiting  . [place], MD Follow up in 1 week(s).   Specialty:  Neurology Why:  hospital discharge follow up repeat dilantin level in 7 days, then check dilantin level monthly check albumin level at follow up as well Contact information: [place] [id number] [phone]        [name] Gravel, MD Follow up in 1 week(s).   Specialty:  Internal Medicine Why:  hospital discharge follow up with your PCP pcp to repeat cxr two view in 3-4 weeks Contact information: [address] [place] [address] [phone]            The results of significant diagnostics from this  hospitalization (including imaging, microbiology, ancillary and laboratory) are listed below for reference.    Significant Diagnostic Studies: Dg Chest 2 View  Result Date: 03/19/2018 CLINICAL DATA:  Seizures EXAM: CHEST - 2 VIEW COMPARISON:  August 05, 2017 FINDINGS: There is focal airspace consolidation with volume loss in a portion of the right middle lobe. Lungs elsewhere clear. Heart size and pulmonary vascularity are normal. No adenopathy. There is aortic atherosclerosis. No pneumothorax.  There is evidence of an old healed fracture of the lateral right clavicle. No acute fracture evident. IMPRESSION: Focal airspace consolidation with volume loss in right middle lobe. Lungs elsewhere clear. Stable cardiac silhouette. Aortic atherosclerosis. No pneumothorax. Old fracture lateral right clavicle. Aortic Atherosclerosis (ICD10-I70.0). Followup PA and lateral chest radiographs recommended in 3-4 weeks following trial of antibiotic therapy to ensure resolution and exclude underlying malignancy. It should be noted an obstructing lesion in a segmental right middle lobe bronchus could cause the current appearance. Electronically Signed   By: Lowella Grip III M.D.   On: 03/19/2018 19:51   Ct Head Wo Contrast  Result Date: 03/19/2018 CLINICAL DATA:  5 seizures today. History of seizures. Transient left-sided numbness following the seizures. EXAM: CT HEAD WITHOUT CONTRAST TECHNIQUE: Contiguous axial images were obtained from the base of the skull through the vertex without intravenous contrast. COMPARISON:  Brain MR dated 12/19/2017. Head CT dated 12/19/2017. 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 Stable mildly enlarged ventricles and cortical sulci. No intracranial hemorrhage, mass lesion or CT evidence of acute infarction. Vascular: No hyperdense vessel or unexpected calcification. Skull: Stable right posterior craniotomy changes.  Sinuses/Orbits: Unremarkable. Other: None. IMPRESSION: 1. No acute abnormality. 2. 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 3. Stable mild diffuse cerebral and cerebellar atrophy. Electronically Signed   By: Claudie Revering M.D.   On: 03/19/2018 20:22   Ct Chest Wo Contrast  Result Date: 03/24/2018 CLINICAL DATA:  Aspiration. EXAM: CT CHEST WITHOUT CONTRAST TECHNIQUE: Multidetector CT imaging of the chest was performed following the standard protocol without IV contrast. COMPARISON:  Chest x-ray dated 03/19/2018 and CT scan of the chest dated 05/28/2007 FINDINGS: Cardiovascular: Aortic atherosclerosis. Heart size is normal. No significant pericardial effusion. Mediastinum/Nodes: No adenopathy. Thyroid gland, trachea, and esophagus are normal. Lungs/Pleura: There is extensive almost complete atelectasis of the entire right middle lobe. There is no discrete endobronchial lesion. The lungs are hyperinflated with diffuse emphysematous changes particularly in the upper lobes. Minimal scarring at the lung bases. There are few scattered tiny pulmonary nodules which are unchanged since 2008. Upper Abdomen: No acute abnormality. Musculoskeletal: No acute abnormality. Accentuation of the thoracic kyphosis. Old bilateral rib fractures. IMPRESSION: 1. Almost complete atelectasis of the right middle lobe without a visible bronchial lesion. This could be secondary to aspiration. 2. Emphysema. 3. Aortic atherosclerosis. Electronically Signed   By: Lorriane Shire M.D.   On: 03/24/2018 17:02   Mr Brain Wo Contrast  Result Date: 03/20/2018 CLINICAL DATA:  Initial evaluation for acute left-sided numbness. EXAM: MRI HEAD WITHOUT CONTRAST MRI CERVICAL SPINE WITHOUT CONTRAST TECHNIQUE: Multiplanar, multiecho pulse sequences of the brain and surrounding structures, and cervical spine, to include the craniocervical junction and cervicothoracic junction, were obtained without  intravenous contrast. COMPARISON:  Prior CT from 03/19/2018 as well as previous MRI from 12/19/2017. FINDINGS: MRI HEAD FINDINGS Brain: Cerebral volume within normal limits and stable. Large volume right parieto-occipital encephalomalacia, unchanged. No acute intracranial infarct. No acute or chronic intracranial hemorrhage. No mass lesion, midline shift or mass effect. No hydrocephalus. Ex vacuo dilatation of the right lateral ventricle related to the right cerebral encephalomalacia. No extra-axial fluid collection. Normal pituitary gland. Vascular: Major intracranial vascular flow voids are maintained. Skull and upper cervical spine: Craniocervical junction normal. Prior right parietooccipital craniectomy with cranioplasty. No scalp soft tissue abnormality. Bone marrow signal intensity normal. Sinuses/Orbits: Globes and orbital soft tissues  within normal limits. Paranasal sinuses are clear. Small left mastoid effusion, of doubtful significance. Other: None. MRI CERVICAL SPINE FINDINGS Alignment: Vertebral bodies normally aligned with preservation of the normal cervical lordosis. No listhesis. Vertebrae: Vertebral body height maintained. No acute or chronic fracture. Bone marrow signal intensity mildly heterogeneous but within normal limits. Reactive endplate changes about the C5-6 and C6-7 interspaces. No worrisome osseous lesions. Cord: Signal intensity within the cervical spinal cord is normal Posterior Fossa, vertebral arteries, paraspinal tissues: Paraspinous soft tissues within normal limits. Normal intravascular flow voids present within the vertebral arteries bilaterally. Disc levels: C2-C3: Unremarkable. C3-C4: Mild diffuse disc bulge with uncovertebral hypertrophy. Mild left-sided facet hypertrophy. No stenosis. C4-C5: Mild disc bulge. Prominent left-sided facet degeneration. Resultant moderate left C5 foraminal stenosis. No canal or right foraminal narrowing. C5-C6: Chronic diffuse degenerative disc  osteophyte, asymmetric to the right. Broad posterior component flattens the ventral CSF. Mild spinal stenosis without cord deformity. Severe right C6 foraminal stenosis. C6-C7: Diffuse circumferential disc osteophyte. Flattening of the ventral CSF with mild spinal stenosis. Foramina remain patent. C7-T1:  Unremarkable. Visualized upper thoracic spine within normal limits. IMPRESSION: MRI HEAD IMPRESSION: 1. No acute intracranial abnormality. 2. Chronic right parieto-occipital encephalomalacia, stable. MRI CERVICAL SPINE IMPRESSION: 1. No acute abnormality within the cervical spine. 2. Prominent left-sided facet hypertrophy at C4-5 with resultant moderate left C5 foraminal stenosis. 3. Degenerative disc osteophyte at C5-6 with resultant severe right C6 foraminal stenosis. 4. Degenerative spondylolysis at C5-6 and C6-7 with resultant mild spinal stenosis. Electronically Signed   By: Jeannine Boga M.D.   On: 03/20/2018 05:27   Mr Cervical Spine Wo Contrast  Result Date: 03/20/2018 CLINICAL DATA:  Initial evaluation for acute left-sided numbness. EXAM: MRI HEAD WITHOUT CONTRAST MRI CERVICAL SPINE WITHOUT CONTRAST TECHNIQUE: Multiplanar, multiecho pulse sequences of the brain and surrounding structures, and cervical spine, to include the craniocervical junction and cervicothoracic junction, were obtained without intravenous contrast. COMPARISON:  Prior CT from 03/19/2018 as well as previous MRI from 12/19/2017. FINDINGS: MRI HEAD FINDINGS Brain: Cerebral volume within normal limits and stable. Large volume right parieto-occipital encephalomalacia, unchanged. No acute intracranial infarct. No acute or chronic intracranial hemorrhage. No mass lesion, midline shift or mass effect. No hydrocephalus. Ex vacuo dilatation of the right lateral ventricle related to the right cerebral encephalomalacia. No extra-axial fluid collection. Normal pituitary gland. Vascular: Major intracranial vascular flow voids are  maintained. Skull and upper cervical spine: Craniocervical junction normal. Prior right parietooccipital craniectomy with cranioplasty. No scalp soft tissue abnormality. Bone marrow signal intensity normal. Sinuses/Orbits: Globes and orbital soft tissues within normal limits. Paranasal sinuses are clear. Small left mastoid effusion, of doubtful significance. Other: None. MRI CERVICAL SPINE FINDINGS Alignment: Vertebral bodies normally aligned with preservation of the normal cervical lordosis. No listhesis. Vertebrae: Vertebral body height maintained. No acute or chronic fracture. Bone marrow signal intensity mildly heterogeneous but within normal limits. Reactive endplate changes about the C5-6 and C6-7 interspaces. No worrisome osseous lesions. Cord: Signal intensity within the cervical spinal cord is normal Posterior Fossa, vertebral arteries, paraspinal tissues: Paraspinous soft tissues within normal limits. Normal intravascular flow voids present within the vertebral arteries bilaterally. Disc levels: C2-C3: Unremarkable. C3-C4: Mild diffuse disc bulge with uncovertebral hypertrophy. Mild left-sided facet hypertrophy. No stenosis. C4-C5: Mild disc bulge. Prominent left-sided facet degeneration. Resultant moderate left C5 foraminal stenosis. No canal or right foraminal narrowing. C5-C6: Chronic diffuse degenerative disc osteophyte, asymmetric to the right. Broad posterior component flattens the ventral CSF. Mild spinal stenosis without cord  deformity. Severe right C6 foraminal stenosis. C6-C7: Diffuse circumferential disc osteophyte. Flattening of the ventral CSF with mild spinal stenosis. Foramina remain patent. C7-T1:  Unremarkable. Visualized upper thoracic spine within normal limits. IMPRESSION: MRI HEAD IMPRESSION: 1. No acute intracranial abnormality. 2. Chronic right parieto-occipital encephalomalacia, stable. MRI CERVICAL SPINE IMPRESSION: 1. No acute abnormality within the cervical spine. 2. Prominent  left-sided facet hypertrophy at C4-5 with resultant moderate left C5 foraminal stenosis. 3. Degenerative disc osteophyte at C5-6 with resultant severe right C6 foraminal stenosis. 4. Degenerative spondylolysis at C5-6 and C6-7 with resultant mild spinal stenosis. Electronically Signed   By: Jeannine Boga M.D.   On: 03/20/2018 05:27   Mr [name]  Result Date: 03/20/2018 CLINICAL DATA:  62 year old female with lumbar back pain for greater than 6 weeks. History of L1 compression fracture. EXAM: MRI LUMBAR SPINE WITHOUT CONTRAST TECHNIQUE: Multiplanar, multisequence MR imaging of the lumbar spine was performed. No intravenous contrast was administered. COMPARISON:  Lumbar radiographs 12/19/2017. Thoracic and lumbar MRI 06/10/2016. FINDINGS: Segmentation: Normal, which is the same numbering system on the 2017 MRI. Alignment:  Stable lumbar lordosis. Vertebrae: Stable vertebral body height since the 2017 MRI. Mild chronic endplate irregularity including at L1. Regressed degenerative appearing right posterolateral L4-L5 endplate edema. No new marrow edema or evidence of acute osseous abnormality. Visualized bone marrow signal is within normal limits. Conus medullaris and cauda equina: Conus extends to the L1 level. No lower spinal cord or conus signal abnormality. Paraspinal and other soft tissues: Negative visible abdominal viscera. Disc levels: T11-T12: Chronic anterior disc bulging and endplate spurring. No stenosis. T12-L1:  Negative. L1-L2:  Negative. L2-L3: Stable mild disc bulging, facet and ligament flavum hypertrophy. Stable borderline to mild spinal stenosis. L3-L4: Stable mild chronic disc bulging, mild facet and ligament flavum hypertrophy. Stable mild spinal stenosis. No lateral recess or foraminal stenosis. L4-L5: Stable mild circumferential disc bulging and endplate spurring. Stable mild facet and ligament flavum hypertrophy; small posteriorly situated synovial cyst on the right  which should not cause neural compromise. Stable borderline to mild spinal stenosis with no lateral recess or foraminal stenosis. L5-S1:  Negative. IMPRESSION: 1. No acute findings in the lumbar spine. 2. Chronic borderline to mild multifactorial spinal stenosis L2-L3 through L4-L5 with no convincing lateral recess or foraminal stenosis. Electronically Signed   By: Genevie Ann M.D.   On: 03/20/2018 19:18   Dg Swallowing Func-speech Pathology  Result Date: 03/25/2018 Objective Swallowing Evaluation: Type of Study: MBS-Modified Barium Swallow Study  Patient Details Name: NEKESHIA LENHARDT MRN: [id number] Date of Birth: 03-30-1956 Today's Date: 03/25/2018 Time: SLP Start Time (ACUTE ONLY): 1048 -SLP Stop Time (ACUTE ONLY): 1103 SLP Time Calculation (min) (ACUTE ONLY): 15 min Past Medical History: Past Medical History: Diagnosis Date . Asthma  . Cerebral hemorrhage ([place]) 1989 . Chronic back pain  . COPD (chronic obstructive pulmonary disease) ([place])  . DDD (degenerative disc disease) 06/12/2013 . DVT (deep venous thrombosis) ([place])   "she's had several since 1990" . GERD (gastroesophageal reflux disease)  . Headache   "usually around time when she's had a seizure" . History of blood transfusion   "when she was a baby" . History of stomach ulcers  . Kidney stones  . Seizures ([place])   "because of her brain surgery" . Stroke [facility]) [phone]  family denies residual on 11/09/2014 . Tunnel vision   "since brain OR" Past Surgical History: Past Surgical History: Procedure Laterality Date . [place]  craniotomy with hematoma  evacuation . CESAREAN SECTION  1979; 1987; 1989 . HEMORRHOID SURGERY   . TUBAL LIGATION  1990's HPI: REY FORS is an 62 y.o. female past medical history of GERD, ICH (1990's), seizures, DV,T chronic back pain, COPD presents to the emergency room for left leg weakness, drowsiness in the morning of 8/16.  Found to have markedly high dilantin level, MRI negative for acute abnormalities, MRI of c spine showed  foraminal stenosis at C6. CXR Almost complete atelectasis of the right middle lobe without a  No data recorded Assessment / Plan / Recommendation CH[facility] IP CLINICAL IMPRESSIONS 03/25/2018 Clinical Impression Pt exhibited mild oral deficits marked by decreased bolus cohesion with premature spill of thin into pyriform sinuses and barium falling sublingually during attempts to transit thin and barium pill. Lingual residue spilled to valleculae and pyriform sinuses and cleared with following trial of thin. Swallow initiation, laryngeal elevation and epiglottic inversion adequate to prevent barium intrusion into laryngeal vestibule during study. Esophageal scan, which does not diagnose below the level of the pharyngoesophageal segment, did not reveal overt abnormalitites during brief view. Recommend continue regular texture, thin liquids, pills with thin (barium pill not coated and can be more difficult to transit), rest breaks if needed.  SLP Visit Diagnosis Dysphagia, oral phase (R13.11) Attention and concentration deficit following -- Frontal lobe and executive function deficit following -- Impact on safety and function Mild aspiration risk   CHL IP TREATMENT RECOMMENDATION 03/25/2018 Treatment Recommendations No treatment recommended at this time   No flowsheet data found. CHL IP DIET RECOMMENDATION 03/25/2018 SLP Diet Recommendations Regular solids;Thin liquid Liquid Administration via Cup;Straw Medication Administration Whole meds with liquid Compensations Slow rate;Small sips/bites Postural Changes Seated upright at 90 degrees;Remain semi-upright after after feeds/meals (Comment)   CHL IP OTHER RECOMMENDATIONS 03/25/2018 Recommended Consults -- Oral Care Recommendations Oral care BID Other Recommendations --   CHL IP FOLLOW UP RECOMMENDATIONS 03/25/2018 Follow up Recommendations None   No flowsheet data found.     CHL IP ORAL PHASE 03/25/2018 Oral Phase Impaired Oral - Pudding Teaspoon -- Oral - Pudding Cup -- Oral - Honey  Teaspoon -- Oral - Honey Cup -- Oral - Nectar Teaspoon -- Oral - Nectar Cup -- Oral - Nectar Straw -- Oral - Thin Teaspoon -- Oral - Thin Cup Premature spillage;Lingual/palatal residue;Decreased bolus cohesion Oral - Thin Straw Lingual/palatal residue Oral - Puree -- Oral - Mech Soft -- Oral - Regular WFL Oral - Multi-Consistency -- Oral - Pill Delayed oral transit Oral Phase - Comment --  CHL IP PHARYNGEAL PHASE 03/25/2018 Pharyngeal Phase WFL Pharyngeal- Pudding Teaspoon -- Pharyngeal -- Pharyngeal- Pudding Cup -- Pharyngeal -- Pharyngeal- Honey Teaspoon -- Pharyngeal -- Pharyngeal- Honey Cup -- Pharyngeal -- Pharyngeal- Nectar Teaspoon -- Pharyngeal -- Pharyngeal- Nectar Cup -- Pharyngeal -- Pharyngeal- Nectar Straw -- Pharyngeal -- Pharyngeal- Thin Teaspoon -- Pharyngeal -- Pharyngeal- Thin Cup -- Pharyngeal -- Pharyngeal- Thin Straw -- Pharyngeal -- Pharyngeal- Puree -- Pharyngeal -- Pharyngeal- Mechanical Soft -- Pharyngeal -- Pharyngeal- Regular -- Pharyngeal -- Pharyngeal- Multi-consistency -- Pharyngeal -- Pharyngeal- Pill -- Pharyngeal -- Pharyngeal Comment --  CHL IP CERVICAL ESOPHAGEAL PHASE 03/25/2018 Cervical Esophageal Phase WFL Pudding Teaspoon -- Pudding Cup -- Honey Teaspoon -- Honey Cup -- Nectar Teaspoon -- Nectar Cup -- Nectar Straw -- Thin Teaspoon -- Thin Cup -- Thin Straw -- Puree -- Mechanical Soft -- Regular -- Multi-consistency -- Pill -- Cervical Esophageal Comment -- Houston Siren 03/25/2018, 11:48 AM  Orbie Pyo Colvin Caroli.Ed Engineer, agricultural  [phone]              Microbiology: Recent Results (from the past 240 hour(s))  Urine culture     Status: Abnormal   Collection Time: 03/19/18  6:48 PM  Result Value Ref Range Status   Specimen Description URINE, CLEAN CATCH  Final   Special Requests   Final    NONE Performed at [facility], [address] [place], [place] [address]    Culture MULTIPLE SPECIES PRESENT, SUGGEST RECOLLECTION (A)  Final   Report Status  03/20/2018 FINAL  Final     Labs: Basic Metabolic Panel: Recent Labs  Lab 03/19/18 1752 03/20/18 0535 03/22/18 0455 03/23/18 0539 03/25/18 0434  NA 142 144 143 143 142  K 3.6 3.6 4.2 4.1 3.9  CL 109 110 111 109 112*  CO2 26 27 27 27 26   GLUCOSE 88 83 79 90 87  BUN 5* 6 13 17 18   CREATININE 0.65 0.77 0.74 0.74 0.71  CALCIUM 8.8* 8.6* 8.7* 8.7* 8.4*  MG  --   --   --   --  2.0   Liver Function Tests: Recent Labs  Lab 03/20/18 0535 03/22/18 0455 03/23/18 0539 03/25/18 0434  AST 16  --  14* 15  ALT 11  --  10 12  ALKPHOS 48  --  40 42  BILITOT 0.7  --  0.4 0.3  PROT 5.5*  --  5.1* 5.0*  ALBUMIN 3.3* 3.0* 2.9* 3.0*   No results for input(s): LIPASE, AMYLASE in the last 168 hours. No results for input(s): AMMONIA in the last 168 hours. CBC: Recent Labs  Lab 03/19/18 1752 03/24/18 1236 03/25/18 0434  WBC 3.5* 4.4 4.4  HGB 11.9* 13.0 12.1  HCT 37.4 40.2 37.7  MCV 103.3* 103.3* 104.1*  PLT 135* 142* 115*   Cardiac Enzymes: Recent Labs  Lab 03/21/18 1510  TROPONINI <0.03   BNP: BNP (last 3 results) No results for input(s): BNP in the last 8760 hours.  ProBNP (last 3 results) No results for input(s): PROBNP in the last 8760 hours.  CBG: Recent Labs  Lab 03/19/18 1746  GLUCAP 87       Signed:  [name] MD, PhD  [facility] 03/25/2018, 12:38 PM

## 2018-03-31 ENCOUNTER — Encounter: Payer: Self-pay | Admitting: Neurology

## 2018-04-01 DIAGNOSIS — G40909 Epilepsy, unspecified, not intractable, without status epilepticus: Secondary | ICD-10-CM | POA: Diagnosis not present

## 2018-04-07 ENCOUNTER — Other Ambulatory Visit: Payer: Self-pay

## 2018-04-07 ENCOUNTER — Ambulatory Visit: Payer: Medicare HMO | Admitting: Neurology

## 2018-04-07 ENCOUNTER — Encounter: Payer: Self-pay | Admitting: Neurology

## 2018-04-07 VITALS — BP 108/70 | HR 75 | Ht 67.5 in | Wt 127.0 lb

## 2018-04-07 DIAGNOSIS — R251 Tremor, unspecified: Secondary | ICD-10-CM

## 2018-04-07 DIAGNOSIS — G243 Spasmodic torticollis: Secondary | ICD-10-CM | POA: Diagnosis not present

## 2018-04-07 DIAGNOSIS — F419 Anxiety disorder, unspecified: Secondary | ICD-10-CM | POA: Diagnosis not present

## 2018-04-07 DIAGNOSIS — G629 Polyneuropathy, unspecified: Secondary | ICD-10-CM | POA: Diagnosis not present

## 2018-04-07 DIAGNOSIS — G40219 Localization-related (focal) (partial) symptomatic epilepsy and epileptic syndromes with complex partial seizures, intractable, without status epilepticus: Secondary | ICD-10-CM | POA: Diagnosis not present

## 2018-04-07 MED ORDER — GABAPENTIN 300 MG PO CAPS: ORAL_CAPSULE | ORAL | 6 refills | Status: DC

## 2018-04-07 MED ORDER — LACOSAMIDE 200 MG PO TABS: 200.0000 mg | ORAL_TABLET | Freq: Two times a day (BID) | ORAL | 3 refills | Status: DC

## 2018-04-07 NOTE — Progress Notes (Signed)
NEUROLOGY CONSULTATION NOTE  ANDRIAN URBACH MRN: [id number] DOB: 03-31-1956  Referring provider: Dr. [name] Primary care provider: Dr. [name]  Reason for consult:  Establish local care for epilepsy  Dear Dr Assunta Found:  Thank you for your kind referral of CORRETTA MUNCE for consultation of the above symptoms. Although her history is well known to you, please allow me to reiterate it for the purpose of our medical record. The patient was accompanied to the clinic by her mother-in-law who also provides collateral information. Records and images were personally reviewed where available.  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 She had been seeing epileptologist [name], records were reviewed and will be summarized as follows. In the past, seizures would start with headache and burning sensation of the left arm, which may be followed by left arm jerking. Over time, she has not had any prior warning symptoms. She has had seizures where she is noted to have kicking and rocking movements of the pelvis with unresponsiveness. She was in the EMU in 10/2012 where 11 seizures were captured from the right posterior hemisphere/right inferior temporal region. Interictal EEG showed abundant right posterior temporal epileptiform discharges, frequent right anterior temporal epileptiform discharges, as well as occasional independent left anterior temporal epileptiform discharges. There was focal slowing over the right posterior hemisphere. MRI brain in 2014 showed right parietal/occipital encephalomalacia, right hippocampus smaller than left. PET scan in 2015 showed multiple areas of hypometabolism on the right, mostly corresponding to or adjacent to the structural  abnormality. Ictal SPECT showed right hemisphere hypoperfusion. SISCOM showed right frontal region significant on the subtraction. MEG in 07/2013 showed spikes localizing to the right occipital, parietal, mesial temporal regions with a weak signal left mesial parietal. Concomitant EEG showed right posterior spikes. Functional mapping suggestive of left language lateralization. She has tried multiple AEDs in the past with various side effects or ineffective, and has been taking Dilantin 160mg  in AM, 200mg  in PM and Vimpat 200mg  BID with seizures around once a month. She was admitted to [facility] on 03/19/18 for left leg weakness and drowsiness. She was fishing with her boyfriend when she suddenly could not move her left leg and almost fell. She states that her boyfriend told her that she was shaking and had a seizure while they were fishing. She was brought to [facility] where Dilantin level was 33.8. She was adamant that she has been taking her medications as instructed and did not take more than prescribed, no new medications taken. She had an MRI brain without contrast which I personally reviewed, no acute changes seen. Large volume right parieto-occipital encephalomalacia was unchanged. Right hippocampus is smaller. She continued to report weakness and had an MRI cervical and lumbar spine which did not show any myelopathy, there was prominent left-sided facet hypertrophy at C4-5 with resultant moderate left C5 foraminal stenosis, degenerative disc osteophyte at C5-6 with severe right C6 foraminal stenosis, and mild spinal stenosis at C5-6 and C6-7, chronic borderline to mild multifactorial spinal stenosis at L2-3 through L4-5 with no convincing lateral recess or foraminal stenosis. She continued to report burning pain in her legs and was started on low dose gabapentin 300mg  qhs with no side effects. Dilantin was held in the hospital, she was discharged on a lower dose, but  states that she has been taking her pre-hospital dose  of 160mg  in AM, 200mg  in PM but has had 4 seizures since hospital discharge 2 weeks ago, which is unusual. She had 2 seizures in one day in the car with her mother-in-law, her mother-in-law had to pull over. The next day she had 2 at home, her daughter found her in the bathroom. She has not been sleeping well the past few days. She has a headache after the seizures. Aside from the convulsions, she has episodes where a feeling comes over her left side, from the arm down her leg, then she is weaker after. She denies any jerking. She is quite anxious in the office today and perseverates on her symptoms. She states she "stays nervous." She reports taking prn Ativan in the past, but discontinued at [facility]. She repeatedly states her feet are numb and they "feel like they are grabbed." She feels gabapentin has helped some with this.   Prior AEDs: phenobarbital (ineffective, mood issues), Depakote (multiple trials, ineffective), Keppra (ineffective), Zonisamide (side effects on brief trial of higher dose than prescribed - 500mg  instead of 200mg ), Onfi (mouth sores), Tegretol (ineffective), Lamictal (nausea, dizziness, malaise), Felbatol (nausea, dizziness, malaise), Topamax (ineffective), Tigabine (tried per notes, details unclear), Neurontin (ineffective, nausea), Lyrica (leg pain reported on 25mg  daily, tingling)  Epilepsy Risk Factors:  Right parieto-occipital AVM s/p rupture and hemorrhage in 1988, s/p craniectomy. Otherwise she had a normal birth and early development.  There is no history of febrile convulsions, CNS infections such as meningitis/encephalitis, or family history of seizures.  PAST MEDICAL HISTORY: Past Medical History:  Diagnosis Date  . Asthma   . Cerebral hemorrhage ([place]) 1989  . Chronic back pain   . COPD (chronic obstructive pulmonary disease) ([place])   . DDD (degenerative disc disease) 06/12/2013  . DVT (deep venous thrombosis) ([place])    "she's had several since 1990"  . GERD  (gastroesophageal reflux disease)   . Headache    "usually around time when she's had a seizure"  . History of blood transfusion    "when she was a baby"  . History of stomach ulcers   . Kidney stones   . Seizures ([place])    "because of her brain surgery"  . Stroke [facility]) [phone]   family denies residual on 11/09/2014  . Tunnel vision    "since brain OR"    PAST SURGICAL HISTORY: Past Surgical History:  Procedure Laterality Date  . [place]   craniotomy with hematoma evacuation  . CESAREAN SECTION  1979; 1987; 1989  . HEMORRHOID SURGERY    . TUBAL LIGATION  1990's    MEDICATIONS: Current Outpatient Medications on File Prior to Visit  Medication Sig Dispense Refill  . acetaminophen (TYLENOL) 325 MG tablet Take 650 mg by mouth daily as needed for headache.    . albuterol (PROVENTIL HFA;VENTOLIN HFA) 108 (90 Base) MCG/ACT inhaler Inhale 2 puffs into the lungs every 6 (six) hours as needed for wheezing or shortness of breath.    . gabapentin (NEURONTIN) 300 MG capsule Take 1 capsule (300 mg total) by mouth at bedtime. 30 capsule 0  . lacosamide (VIMPAT) 200 MG TABS tablet Take 200 mg by mouth 2 (two) times daily.    . phenytoin (DILANTIN) 100 MG ER capsule Take 1 capsule (100 mg total) by mouth daily. 30 capsule 0  . phenytoin (DILANTIN) 200 MG ER capsule Take 1 capsule (200 mg total) by mouth at bedtime. 60 capsule 0  .  vitamin B-12 (CYANOCOBALAMIN) 1000 MCG tablet Take 1 tablet (1,000 mcg total) by mouth daily. 30 tablet 0   No current facility-administered medications on file prior to visit.     ALLERGIES: Allergies  Allergen Reactions  . Zonisamide Other (See Comments)    Numbness and tingling over whole body  . Codeine Nausea And Vomiting  . Keppra [Levetiracetam] Other (See Comments)    Causes seizures.   . Pregabalin Nausea And Vomiting  . Olive Oil Rash    FAMILY HISTORY: Family History  Problem Relation Age of Onset  . Heart attack Mother        4 MIs,  started in her 7s, also vaginal cancer and colon cancer  . Cancer Mother   . Heart attack Father        Died suddenly age 71 - autopsy revealed heart attack her patient  . Cancer Brother        Sinus cancer    SOCIAL HISTORY: Social History   Socioeconomic History  . Marital status: Divorced    Spouse name: Not on file  . Number of children: Not on file  . Years of education: Not on file  . Highest education level: Not on file  Occupational History  . Not on file  Social Needs  . Financial resource strain: Not on file  . Food insecurity:    Worry: Not on file    Inability: Not on file  . Transportation needs:    Medical: Not on file    Non-medical: Not on file  Tobacco Use  . Smoking status: Former Smoker    Packs/day: 0.50    Years: 20.00    Pack years: 10.00    Types: Cigarettes    Last attempt to quit: 10/26/2017    Years since quitting: 0.4  . Smokeless tobacco: Never Used  . Tobacco comment: Previously smoked 2 ppd, down to 4 cigs/day  Substance and Sexual Activity  . Alcohol use: No  . Drug use: No  . Sexual activity: Never    Birth control/protection: Post-menopausal  Lifestyle  . Physical activity:    Days per week: Not on file    Minutes per session: Not on file  . Stress: Not on file  Relationships  . Social connections:    Talks on phone: Not on file    Gets together: Not on file    Attends religious service: Not on file    Active member of club or organization: Not on file    Attends meetings of clubs or organizations: Not on file    Relationship status: Not on file  . Intimate partner violence:    Fear of current or ex partner: Not on file    Emotionally abused: Not on file    Physically abused: Not on file    Forced sexual activity: Not on file  Other Topics Concern  . Not on file  Social History Narrative  . Not on file    REVIEW OF SYSTEMS: Constitutional: No fevers, chills, or sweats, no generalized fatigue, change in appetite Eyes:  No visual changes, double vision, eye pain Ear, nose and throat: No hearing loss, ear pain, nasal congestion, sore throat Cardiovascular: No chest pain, palpitations Respiratory:  No shortness of breath at rest or with exertion, wheezes GastrointestinaI: No nausea, vomiting, diarrhea, abdominal pain, fecal incontinence Genitourinary:  No dysuria, urinary retention or frequency Musculoskeletal:  No neck pain, back pain Integumentary: No rash, pruritus, skin lesions Neurological: as above Psychiatric: No  depression, insomnia, anxiety Endocrine: No palpitations, fatigue, diaphoresis, mood swings, change in appetite, change in weight, increased thirst Hematologic/Lymphatic:  No anemia, purpura, petechiae. Allergic/Immunologic: no itchy/runny eyes, nasal congestion, recent allergic reactions, rashes  PHYSICAL EXAM: Vitals:   04/07/18 1039  BP: 108/70  Pulse: 75  SpO2: 98%   General: No acute distress Head:  Normocephalic/atraumatic Eyes: Fundoscopic exam shows bilateral sharp discs, no vessel changes, exudates, or hemorrhages Neck: supple, no paraspinal tenderness, full range of motion Back: No paraspinal tenderness Heart: regular rate and rhythm Lungs: Clear to auscultation bilaterally. Vascular: No carotid bruits. Skin/Extremities: No rash, no edema Neurological Exam: Mental status: alert and oriented to person, place, and time, no dysarthria or aphasia, Fund of knowledge is appropriate.  Recent and remote memory are intact.  Attention and concentration are normal.    Able to name objects and repeat phrases. Cranial nerves: CN I: not tested CN II: pupils equal, round and reactive to light, left homonymous hemianopia, fundi unremarkable. CN III, IV, VI:  full range of motion, no nystagmus, no ptosis CN V: decreased pin and cold on left V1-3 CN VII: upper and lower face symmetric CN VIII: hearing intact to finger rub CN IX, X: gag intact, uvula midline CN XI: sternocleidomastoid  and trapezius muscles intact CN XII: tongue midline Bulk & Tone: normal, no fasciculations, no cogwheeling. Motor: 5/5 throughout with no pronator drift. Sensation: reports decreased pin/cold on left UE and LE, decreased vibration sense to ankles bilaterally.No extinction to double simultaneous stimulation.  Romberg test negative Deep Tendon Reflexes: +2 on right UE and LE, left UE, brisk +3 on left LE, no ankle clonus Plantar responses: downgoing bilaterally Cerebellar: no incoordination on finger to nose testing Gait: narrow-based and steady, able to tandem walk adequately. Tremor: head tremor with neck dystonic posturing (head turned to the right). 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 She has tried multiple AEDs in the past, and has been on Dilantin 160mg  in AM, 200mg  in PM and Vimpat 200mg  BID with around 1 seizure a month, until she was admitted with Dilantin toxicity 2 weeks ago. Since hospital discharge, she reports 4 seizures, which is unusual. She has resumed prior dosing of AEDs, re-check Dilantin level. Gabapentin was added in the hospital for neuropathy, she was instructed to increase dose to 600mg  qhs. This may help with tremor and anxiety as well. We may add on Propranolol in the future. Neuropathy labs will be ordered, she will be scheduled for an EMG/NCV of both LE to further evaluate sensory symptoms. She had previously been referred to Dr. Hall Busing for Botox, she will be referred to our Movement Disorders specialist Dr. Carles Collet for consideration of Botox for possible cervical dystonia. She would like to "return to her prior life," I discussed chances of being seizure-free with addition of  another medication is lower, she may be a good surgical candidate. She had previously expressed interest in epilepsy surgery, but has had several concerns and declined, but is now again potentially interested and will speak with [name]. She does not drive. She will follow-up in 3 months and knows to call for any changes  Thank you for allowing me to participate in the care of this patient. Please do  not hesitate to call for any questions or concerns.   Ellouise Newer, M.D.  CC: [name]

## 2018-04-07 NOTE — Patient Instructions (Addendum)
1. Bloodwork for free and total Dilantin level, TSH, B12, ESR, CRP, SPEP/IFE 2. Increase Gabapentin '300mg'$ : take 2 capsules every night 3. Continue Dilantin '160mg'$  in AM, '200mg'$  in PM and Vimpat '200mg'$  twice a day 4. Schedule EMG/NCV of both LE  5. We will schedule an evaluation with Dr. Carles Collet for Botox option 6. Call our office in 2 weeks for an update, if still a lot of shakiness and anxiety, we will plan to start a new medication 7. If you are interested in pursuing surgery, please call [facility] for a follow-up visit to discuss this 8. Follow-up in 3 months, call for any changes  Seizure Precautions: 1. If medication has been prescribed for you to prevent seizures, take it exactly as directed.  Do not stop taking the medicine without talking to your doctor first, even if you have not had a seizure in a long time.   2. Avoid activities in which a seizure would cause danger to yourself or to others.  Don't operate dangerous machinery, swim alone, or climb in high or dangerous places, such as on ladders, roofs, or girders.  Do not drive unless your doctor says you may.  3. If you have any warning that you may have a seizure, lay down in a safe place where you can't hurt yourself.    4.  No driving for 6 months from last seizure, as per [facility].   Please refer to the following link on the [place] website for more information: [email or web address]/[email or web address]/drivingu.cfm   5.  Maintain good sleep hygiene. Avoid alcohol.  6.  Contact your doctor if you have any problems that may be related to the medicine you are taking.  7.  Call 911 and bring the patient back to the ED if:        A.  The seizure lasts longer than 5 minutes.       B.  The patient doesn't awaken shortly after the seizure  C.  The patient has new problems such as difficulty seeing, speaking or moving  D.  The patient was injured during the seizure  E.  The  patient has a temperature over 102 F (39C)  F.  The patient vomited and now is having trouble breathing

## 2018-04-08 ENCOUNTER — Other Ambulatory Visit (INDEPENDENT_AMBULATORY_CARE_PROVIDER_SITE_OTHER): Payer: Medicare HMO

## 2018-04-08 DIAGNOSIS — G243 Spasmodic torticollis: Secondary | ICD-10-CM

## 2018-04-08 DIAGNOSIS — G40219 Localization-related (focal) (partial) symptomatic epilepsy and epileptic syndromes with complex partial seizures, intractable, without status epilepticus: Secondary | ICD-10-CM

## 2018-04-08 DIAGNOSIS — R251 Tremor, unspecified: Secondary | ICD-10-CM | POA: Diagnosis not present

## 2018-04-08 DIAGNOSIS — F419 Anxiety disorder, unspecified: Secondary | ICD-10-CM

## 2018-04-08 DIAGNOSIS — G629 Polyneuropathy, unspecified: Secondary | ICD-10-CM | POA: Diagnosis not present

## 2018-04-08 LAB — TSH: TSH: 1.38 u[IU]/mL (ref 0.35–4.50)

## 2018-04-08 LAB — C-REACTIVE PROTEIN: CRP: 0.2 mg/dL — AB (ref 0.5–20.0)

## 2018-04-08 LAB — VITAMIN B12: Vitamin B-12: 308 pg/mL (ref 211–911)

## 2018-04-08 LAB — SEDIMENTATION RATE: Sed Rate: 9 mm/hr (ref 0–30)

## 2018-04-09 ENCOUNTER — Telehealth: Payer: Self-pay | Admitting: *Deleted

## 2018-04-09 ENCOUNTER — Telehealth: Payer: Self-pay | Admitting: Neurology

## 2018-04-09 NOTE — Telephone Encounter (Signed)
Bloodwork done 04/06/18:  Total Dilantin level 10.7 (ref 10-20) Free level 0.6 (ref 1.0-2.0)  04/01/18: CBC and BMP normal

## 2018-04-09 NOTE — Telephone Encounter (Signed)
Patient given results and instructions.   

## 2018-04-09 NOTE — Telephone Encounter (Signed)
-----   Message from [name], MD sent at 04/09/2018  3:30 PM EDT ----- Pls let her know the Dilantin level was within range, it is not elevated this time. Let's give some time for gabapentin to see if this helps more with the shakiness, call for update in 2 weeks. thanks

## 2018-04-12 LAB — IMMUNOFIXATION ELECTROPHORESIS
IGM, SERUM: 115 mg/dL (ref 50–300)
IMMUNOGLOBULIN A: 197 mg/dL (ref 47–310)
IgG (Immunoglobin G), Serum: 670 mg/dL (ref 600–1640)
Immunofix Electr Int: NOT DETECTED

## 2018-04-12 LAB — PROTEIN ELECTROPHORESIS, SERUM
Albumin ELP: 4 g/dL (ref 3.8–4.8)
Alpha 1: 0.3 g/dL (ref 0.2–0.3)
Alpha 2: 0.7 g/dL (ref 0.5–0.9)
BETA GLOBULIN: 0.4 g/dL (ref 0.4–0.6)
Beta 2: 0.3 g/dL (ref 0.2–0.5)
Gamma Globulin: 0.6 g/dL — ABNORMAL LOW (ref 0.8–1.7)
TOTAL PROTEIN: 6.2 g/dL (ref 6.1–8.1)

## 2018-04-12 LAB — PHENYTOIN LEVEL, TOTAL: Phenytoin, Total: 18.4 mg/L (ref 10.0–20.0)

## 2018-04-15 DIAGNOSIS — Z720 Tobacco use: Secondary | ICD-10-CM | POA: Diagnosis not present

## 2018-04-15 DIAGNOSIS — J449 Chronic obstructive pulmonary disease, unspecified: Secondary | ICD-10-CM | POA: Diagnosis not present

## 2018-04-15 DIAGNOSIS — T887XXA Unspecified adverse effect of drug or medicament, initial encounter: Secondary | ICD-10-CM | POA: Diagnosis not present

## 2018-04-15 DIAGNOSIS — I951 Orthostatic hypotension: Secondary | ICD-10-CM | POA: Diagnosis not present

## 2018-04-15 DIAGNOSIS — E440 Moderate protein-calorie malnutrition: Secondary | ICD-10-CM | POA: Diagnosis not present

## 2018-04-15 DIAGNOSIS — G40909 Epilepsy, unspecified, not intractable, without status epilepticus: Secondary | ICD-10-CM | POA: Diagnosis not present

## 2018-04-15 DIAGNOSIS — M6281 Muscle weakness (generalized): Secondary | ICD-10-CM | POA: Diagnosis not present

## 2018-04-15 DIAGNOSIS — I629 Nontraumatic intracranial hemorrhage, unspecified: Secondary | ICD-10-CM | POA: Diagnosis not present

## 2018-04-22 ENCOUNTER — Ambulatory Visit (INDEPENDENT_AMBULATORY_CARE_PROVIDER_SITE_OTHER): Payer: Medicare HMO | Admitting: Neurology

## 2018-04-22 DIAGNOSIS — G243 Spasmodic torticollis: Secondary | ICD-10-CM

## 2018-04-22 DIAGNOSIS — G40219 Localization-related (focal) (partial) symptomatic epilepsy and epileptic syndromes with complex partial seizures, intractable, without status epilepticus: Secondary | ICD-10-CM | POA: Diagnosis not present

## 2018-04-22 DIAGNOSIS — G629 Polyneuropathy, unspecified: Secondary | ICD-10-CM

## 2018-04-22 DIAGNOSIS — R251 Tremor, unspecified: Secondary | ICD-10-CM

## 2018-04-22 DIAGNOSIS — F419 Anxiety disorder, unspecified: Secondary | ICD-10-CM

## 2018-04-22 NOTE — Procedures (Signed)
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   NCV & EMG Findings: Extensive electrodiagnostic testing of the right lower extremity and additional studies of the left shows:  1. Superficial peroneal sensory response is reduced on the right (2.7 V) and absent on the left.  Bilateral sural sensory responses are within normal limits. 2. Bilateral peroneal motor responses at the extensor digitorum brevis are absent, and within normal limits at the tibialis anterior.  Bilateral tibial motor responses are within normal limits. 3. Bilateral tibial H reflex studies are within normal limits. 4. There is no evidence of active or chronic motor axonal loss changes affecting any of the tested muscles.  Motor unit configuration and recruitment pattern is within normal limits.  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   ___________________________ Narda Amber, DO    Nerve Conduction Studies Anti Sensory Summary Table   Site NR Peak (ms) Norm Peak (ms) P-T Amp (V) Norm P-T Amp  Left Sup Peroneal Anti Sensory (Ant Lat Mall)  38C  12 cm NR  <4.6  >3  Right Sup Peroneal Anti Sensory (Ant Lat Mall)  38C  12 cm    2.6 <4.6 2.7 >3  Left Sural Anti Sensory (Lat Mall)  38C  Calf    2.7 <4.6 4.9 >3  Right Sural Anti Sensory (Lat Mall)   38C  Calf    2.8 <4.6 6.3 >3   Motor Summary Table   Site NR Onset (ms) Norm Onset (ms) O-P Amp (mV) Norm O-P Amp Site1 Site2 Delta-0 (ms) Dist (cm) Vel (m/s) Norm Vel (m/s)  Left Peroneal Motor (Ext Dig Brev)  38C  Ankle NR  <6.0  >2.5 B Fib Ankle  0.0  >40  B Fib NR     Poplt B Fib  0.0  >40  Poplt NR            Right Peroneal Motor (Ext Dig Brev)  38C  Ankle NR  <6.0  >2.5 B Fib Ankle  0.0  >40  B Fib NR     Poplt B Fib  0.0  >40  Poplt NR            Left Peroneal TA Motor (Tib Ant)  38C  Fib Head    3.4 <4.5 4.5 >3 Poplit Fib Head 1.3 8.0 62 >40  Poplit    4.7  4.5         Right Peroneal TA Motor (Tib Ant)  38C  Fib Head    3.6 <4.5 4.2 >3 Poplit Fib Head 1.4 8.0 57 >40  Poplit    5.0  4.1         Left Tibial Motor (Abd Hall Brev)  38C  Ankle    3.5 <6.0 7.5 >4 Knee Ankle 8.6 40.0 47 >40  Knee    12.1  5.3         Right Tibial Motor (Abd [place]  Brev)  38C  Ankle    3.3 <6.0 7.7 >4 Knee Ankle 8.1 40.0 49 >40  Knee    11.4  4.8          H Reflex Studies   NR H-Lat (ms) Lat Norm (ms) L-R H-Lat (ms)  Left Tibial (Gastroc)  38C     34.29 <35 0.00  Right Tibial (Gastroc)  38C     34.29 <35 0.00   EMG   Side Muscle Ins Act Fibs Psw Fasc Number Recrt Dur Dur. Amp Amp. Poly Poly. Comment  Right AntTibialis Nml Nml Nml Nml Nml Nml Nml Nml Nml Nml Nml Nml N/A  Right Gastroc Nml Nml Nml Nml Nml Nml Nml Nml Nml Nml Nml Nml N/A  Right Flex Dig Long Nml Nml Nml Nml Nml Nml Nml Nml Nml Nml Nml Nml N/A  Right RectFemoris Nml Nml Nml Nml Nml Nml Nml Nml Nml Nml Nml Nml N/A  Right GluteusMed Nml Nml Nml Nml Nml Nml Nml Nml Nml Nml Nml Nml N/A  Right BicepsFemS Nml Nml Nml Nml Nml Nml Nml Nml Nml Nml Nml Nml N/A  Left AntTibialis Nml Nml Nml Nml Nml Nml Nml Nml Nml Nml Nml Nml N/A  Left Gastroc Nml Nml Nml Nml Nml Nml Nml Nml Nml Nml Nml Nml N/A  Left Flex Dig Long Nml Nml Nml Nml Nml Nml Nml Nml Nml Nml Nml Nml N/A  Left RectFemoris Nml Nml Nml Nml Nml Nml Nml Nml Nml Nml Nml  Nml N/A  Left GluteusMed Nml Nml Nml Nml Nml Nml Nml Nml Nml Nml Nml Nml N/A  Right BicepsFemS Nml Nml Nml Nml Nml Nml Nml Nml Nml Nml Nml Nml N/A      Waveforms:

## 2018-04-28 ENCOUNTER — Telehealth: Payer: Self-pay

## 2018-04-28 NOTE — Telephone Encounter (Signed)
-----   Message from [name], MD sent at 04/23/2018 10:24 AM EDT ----- Pls let her know the nerve test confirmed the neuropathy in her legs, this is why her legs feel this way. How is she doing? If still bothering her a lot, can increase the gabapentin to 1 in AM, 2 in PM. Has she had any more seizures? Thanks

## 2018-04-28 NOTE — Telephone Encounter (Signed)
LMOM relaying message below.  Asked for return call for questions.

## 2018-05-23 ENCOUNTER — Emergency Department (HOSPITAL_COMMUNITY): Payer: Medicare HMO

## 2018-05-23 ENCOUNTER — Other Ambulatory Visit: Payer: Self-pay

## 2018-05-23 ENCOUNTER — Observation Stay (HOSPITAL_COMMUNITY)
Admission: EM | Admit: 2018-05-23 | Discharge: 2018-05-26 | Disposition: A | Discharge status: Home / Self Care | Payer: Medicare HMO | Attending: Internal Medicine | Admitting: Internal Medicine

## 2018-05-23 ENCOUNTER — Encounter (HOSPITAL_COMMUNITY): Payer: Self-pay | Admitting: Emergency Medicine

## 2018-05-23 DIAGNOSIS — Z8673 Personal history of transient ischemic attack (TIA), and cerebral infarction without residual deficits: Secondary | ICD-10-CM | POA: Insufficient documentation

## 2018-05-23 DIAGNOSIS — G629 Polyneuropathy, unspecified: Secondary | ICD-10-CM | POA: Insufficient documentation

## 2018-05-23 DIAGNOSIS — G40909 Epilepsy, unspecified, not intractable, without status epilepticus: Secondary | ICD-10-CM | POA: Diagnosis not present

## 2018-05-23 DIAGNOSIS — J439 Emphysema, unspecified: Secondary | ICD-10-CM | POA: Diagnosis not present

## 2018-05-23 DIAGNOSIS — Z888 Allergy status to other drugs, medicaments and biological substances status: Secondary | ICD-10-CM | POA: Diagnosis not present

## 2018-05-23 DIAGNOSIS — Z91018 Allergy to other foods: Secondary | ICD-10-CM | POA: Diagnosis not present

## 2018-05-23 DIAGNOSIS — F419 Anxiety disorder, unspecified: Secondary | ICD-10-CM | POA: Diagnosis present

## 2018-05-23 DIAGNOSIS — I700 Atherosclerosis of aorta: Secondary | ICD-10-CM | POA: Diagnosis not present

## 2018-05-23 DIAGNOSIS — J449 Chronic obstructive pulmonary disease, unspecified: Secondary | ICD-10-CM | POA: Diagnosis not present

## 2018-05-23 DIAGNOSIS — Z885 Allergy status to narcotic agent status: Secondary | ICD-10-CM | POA: Diagnosis not present

## 2018-05-23 DIAGNOSIS — G8929 Other chronic pain: Secondary | ICD-10-CM | POA: Insufficient documentation

## 2018-05-23 DIAGNOSIS — R569 Unspecified convulsions: Secondary | ICD-10-CM | POA: Diagnosis not present

## 2018-05-23 DIAGNOSIS — J9811 Atelectasis: Secondary | ICD-10-CM | POA: Insufficient documentation

## 2018-05-23 DIAGNOSIS — G9389 Other specified disorders of brain: Secondary | ICD-10-CM | POA: Insufficient documentation

## 2018-05-23 DIAGNOSIS — M549 Dorsalgia, unspecified: Secondary | ICD-10-CM | POA: Diagnosis not present

## 2018-05-23 DIAGNOSIS — Z720 Tobacco use: Secondary | ICD-10-CM | POA: Diagnosis present

## 2018-05-23 DIAGNOSIS — R52 Pain, unspecified: Secondary | ICD-10-CM | POA: Diagnosis present

## 2018-05-23 DIAGNOSIS — D696 Thrombocytopenia, unspecified: Secondary | ICD-10-CM | POA: Diagnosis not present

## 2018-05-23 DIAGNOSIS — Z23 Encounter for immunization: Secondary | ICD-10-CM | POA: Insufficient documentation

## 2018-05-23 DIAGNOSIS — Z8719 Personal history of other diseases of the digestive system: Secondary | ICD-10-CM | POA: Diagnosis not present

## 2018-05-23 DIAGNOSIS — Z8711 Personal history of peptic ulcer disease: Secondary | ICD-10-CM | POA: Diagnosis not present

## 2018-05-23 DIAGNOSIS — F411 Generalized anxiety disorder: Secondary | ICD-10-CM | POA: Insufficient documentation

## 2018-05-23 DIAGNOSIS — Z87442 Personal history of urinary calculi: Secondary | ICD-10-CM | POA: Insufficient documentation

## 2018-05-23 DIAGNOSIS — Z9889 Other specified postprocedural states: Secondary | ICD-10-CM | POA: Insufficient documentation

## 2018-05-23 DIAGNOSIS — Z8249 Family history of ischemic heart disease and other diseases of the circulatory system: Secondary | ICD-10-CM | POA: Insufficient documentation

## 2018-05-23 DIAGNOSIS — K219 Gastro-esophageal reflux disease without esophagitis: Secondary | ICD-10-CM | POA: Diagnosis not present

## 2018-05-23 DIAGNOSIS — R5381 Other malaise: Secondary | ICD-10-CM

## 2018-05-23 DIAGNOSIS — R0789 Other chest pain: Secondary | ICD-10-CM | POA: Diagnosis not present

## 2018-05-23 DIAGNOSIS — Z87891 Personal history of nicotine dependence: Secondary | ICD-10-CM | POA: Diagnosis not present

## 2018-05-23 DIAGNOSIS — J441 Chronic obstructive pulmonary disease with (acute) exacerbation: Secondary | ICD-10-CM | POA: Diagnosis not present

## 2018-05-23 DIAGNOSIS — R079 Chest pain, unspecified: Secondary | ICD-10-CM | POA: Diagnosis present

## 2018-05-23 DIAGNOSIS — Z79899 Other long term (current) drug therapy: Secondary | ICD-10-CM | POA: Insufficient documentation

## 2018-05-23 DIAGNOSIS — Z9851 Tubal ligation status: Secondary | ICD-10-CM | POA: Diagnosis not present

## 2018-05-23 DIAGNOSIS — R29898 Other symptoms and signs involving the musculoskeletal system: Secondary | ICD-10-CM

## 2018-05-23 DIAGNOSIS — R0602 Shortness of breath: Secondary | ICD-10-CM | POA: Insufficient documentation

## 2018-05-23 DIAGNOSIS — Z86718 Personal history of other venous thrombosis and embolism: Secondary | ICD-10-CM | POA: Diagnosis not present

## 2018-05-23 DIAGNOSIS — R531 Weakness: Secondary | ICD-10-CM

## 2018-05-23 HISTORY — DX: Polyneuropathy, unspecified: G62.9

## 2018-05-23 LAB — RAPID URINE DRUG SCREEN, HOSP PERFORMED
AMPHETAMINES: NOT DETECTED
BARBITURATES: NOT DETECTED
Benzodiazepines: NOT DETECTED
COCAINE: NOT DETECTED
Opiates: NOT DETECTED
TETRAHYDROCANNABINOL: NOT DETECTED

## 2018-05-23 LAB — CBC
HEMATOCRIT: 41.8 % (ref 36.0–46.0)
HEMOGLOBIN: 13.7 g/dL (ref 12.0–15.0)
MCH: 33.2 pg (ref 26.0–34.0)
MCHC: 32.8 g/dL (ref 30.0–36.0)
MCV: 101.2 fL — ABNORMAL HIGH (ref 80.0–100.0)
NRBC: 0 % (ref 0.0–0.2)
PLATELETS: 147 10*3/uL — AB (ref 150–400)
RBC: 4.13 MIL/uL (ref 3.87–5.11)
RDW: 12.9 % (ref 11.5–15.5)
WBC: 5.3 10*3/uL (ref 4.0–10.5)

## 2018-05-23 LAB — I-STAT BETA HCG BLOOD, ED (MC, WL, AP ONLY)

## 2018-05-23 LAB — BASIC METABOLIC PANEL
ANION GAP: 8 (ref 5–15)
BUN: 11 mg/dL (ref 6–20)
CO2: 23 mmol/L (ref 22–32)
Calcium: 9.1 mg/dL (ref 8.9–10.3)
Chloride: 109 mmol/L (ref 98–111)
Creatinine, Ser: 0.7 mg/dL (ref 0.44–1.00)
GFR calc Af Amer: >60 mL/min (ref >60)
GFR calc non Af Amer: >60 mL/min (ref >60)
GLUCOSE: 88 mg/dL (ref 70–99)
Potassium: 3.9 mmol/L (ref 3.5–5.1)
Sodium: 140 mmol/L (ref 135–145)

## 2018-05-23 LAB — I-STAT TROPONIN, ED: Troponin i, poc: 0 ng/mL (ref 0.00–0.08)

## 2018-05-23 MED ORDER — PANTOPRAZOLE SODIUM 40 MG PO TBEC: 40.0000 mg | DELAYED_RELEASE_TABLET | Freq: Every day | ORAL | Status: DC | PRN

## 2018-05-23 MED ORDER — ACETAMINOPHEN 325 MG PO TABS: 650.0000 mg | ORAL_TABLET | ORAL | Status: DC | PRN

## 2018-05-23 MED ORDER — GABAPENTIN 300 MG PO CAPS
300.0000 mg | ORAL_CAPSULE | Freq: Every day | ORAL | Status: DC
  Administered 2018-05-23 – 2018-05-24 (×2): 300 mg via ORAL
  Filled 2018-05-23 (×2): qty 1

## 2018-05-23 MED ORDER — IOPAMIDOL (ISOVUE-370) INJECTION 76%
INTRAVENOUS | Status: AC
  Filled 2018-05-23: qty 100

## 2018-05-23 MED ORDER — METHYLPREDNISOLONE SODIUM SUCC 125 MG IJ SOLR
125.0000 mg | Freq: Once | INTRAMUSCULAR | Status: AC
  Administered 2018-05-23: 125 mg via INTRAVENOUS
  Filled 2018-05-23: qty 2

## 2018-05-23 MED ORDER — IPRATROPIUM-ALBUTEROL 0.5-2.5 (3) MG/3ML IN SOLN
3.0000 mL | Freq: Once | RESPIRATORY_TRACT | Status: AC
  Administered 2018-05-23: 3 mL via RESPIRATORY_TRACT
  Filled 2018-05-23: qty 3

## 2018-05-23 MED ORDER — MORPHINE SULFATE (PF) 4 MG/ML IV SOLN: 4.0000 mg | Freq: Once | INTRAVENOUS | Status: DC

## 2018-05-23 MED ORDER — LORAZEPAM 2 MG/ML IJ SOLN
0.5000 mg | Freq: Once | INTRAMUSCULAR | Status: AC
  Administered 2018-05-23: 0.5 mg via INTRAVENOUS
  Filled 2018-05-23: qty 1

## 2018-05-23 MED ORDER — ONDANSETRON HCL 4 MG/2ML IJ SOLN: 4.0000 mg | Freq: Four times a day (QID) | INTRAMUSCULAR | Status: DC | PRN

## 2018-05-23 MED ORDER — NITROGLYCERIN 0.4 MG SL SUBL
0.4000 mg | SUBLINGUAL_TABLET | SUBLINGUAL | Status: DC | PRN
  Administered 2018-05-23: 0.4 mg via SUBLINGUAL
  Filled 2018-05-23: qty 1

## 2018-05-23 MED ORDER — ALBUTEROL SULFATE (2.5 MG/3ML) 0.083% IN NEBU: 2.5000 mg | INHALATION_SOLUTION | RESPIRATORY_TRACT | Status: DC | PRN

## 2018-05-23 MED ORDER — ZOLPIDEM TARTRATE 5 MG PO TABS: 5.0000 mg | ORAL_TABLET | Freq: Every evening | ORAL | Status: DC | PRN

## 2018-05-23 MED ORDER — ENOXAPARIN SODIUM 40 MG/0.4ML ~~LOC~~ SOLN
40.0000 mg | SUBCUTANEOUS | Status: DC
  Administered 2018-05-24 – 2018-05-26 (×3): 40 mg via SUBCUTANEOUS
  Filled 2018-05-23 (×3): qty 0.4

## 2018-05-23 MED ORDER — PHENYTOIN SODIUM EXTENDED 30 MG PO CAPS: 60.0000 mg | ORAL_CAPSULE | ORAL | Status: DC

## 2018-05-23 MED ORDER — ALPRAZOLAM 0.25 MG PO TABS: 0.2500 mg | ORAL_TABLET | Freq: Two times a day (BID) | ORAL | Status: DC | PRN

## 2018-05-23 MED ORDER — LORAZEPAM 2 MG/ML IJ SOLN: 1.0000 mg | INTRAMUSCULAR | Status: DC | PRN

## 2018-05-23 MED ORDER — SODIUM CHLORIDE 0.9 % IV BOLUS
1000.0000 mL | Freq: Once | INTRAVENOUS | Status: AC
  Administered 2018-05-23: 1000 mL via INTRAVENOUS

## 2018-05-23 MED ORDER — LACOSAMIDE 200 MG PO TABS
200.0000 mg | ORAL_TABLET | Freq: Two times a day (BID) | ORAL | Status: DC
  Administered 2018-05-23 – 2018-05-26 (×6): 200 mg via ORAL
  Filled 2018-05-23 (×2): qty 1
  Filled 2018-05-23: qty 4
  Filled 2018-05-23 (×3): qty 1

## 2018-05-23 MED ORDER — MORPHINE SULFATE (PF) 2 MG/ML IV SOLN
2.0000 mg | INTRAVENOUS | Status: DC | PRN
  Administered 2018-05-25: 2 mg via INTRAVENOUS
  Filled 2018-05-23: qty 1

## 2018-05-23 MED ORDER — IPRATROPIUM-ALBUTEROL 0.5-2.5 (3) MG/3ML IN SOLN: 3.0000 mL | Freq: Four times a day (QID) | RESPIRATORY_TRACT | Status: DC

## 2018-05-23 MED ORDER — IOPAMIDOL (ISOVUE-370) INJECTION 76%
100.0000 mL | Freq: Once | INTRAVENOUS | Status: AC | PRN
  Administered 2018-05-23: 100 mL via INTRAVENOUS

## 2018-05-23 MED ORDER — PHENYTOIN SODIUM EXTENDED 100 MG PO CAPS
200.0000 mg | ORAL_CAPSULE | Freq: Every day | ORAL | Status: DC
  Filled 2018-05-23: qty 2

## 2018-05-23 MED ORDER — INFLUENZA VAC SPLIT QUAD 0.5 ML IM SUSY
0.5000 mL | PREFILLED_SYRINGE | INTRAMUSCULAR | Status: AC
  Administered 2018-05-24: 0.5 mL via INTRAMUSCULAR
  Filled 2018-05-23: qty 0.5

## 2018-05-23 MED ORDER — DM-GUAIFENESIN ER 30-600 MG PO TB12: 1.0000 | ORAL_TABLET | Freq: Two times a day (BID) | ORAL | Status: DC | PRN

## 2018-05-23 MED ORDER — PHENYTOIN SODIUM EXTENDED 30 MG PO CAPS: 160.0000 mg | ORAL_CAPSULE | Freq: Every day | ORAL | Status: DC

## 2018-05-23 NOTE — ED Triage Notes (Addendum)
Patient to ED c/o central chest pain radiating to L arm upon getting out of the shower with shortness of breath, lightheadedness, and nausea. Patient denies having this chest pain before - describes as burning and worse when taking a deep breath. Also reports tremors at baseline but they are worse today. Resp e/u, skin warm/dry.

## 2018-05-23 NOTE — ED Provider Notes (Signed)
[facility] EMERGENCY DEPARTMENT Provider Note   CSN: [id number] Arrival date & time: 05/23/18  1637     History   Chief Complaint Chief Complaint  Patient presents with  . Chest Pain    HPI [name] is a 62 y.o. female past medical history of cerebral hemorrhage, chronic back pain, COPD, DVT, seizures who presents for evaluation of pain and shortness of breath that began approximately 1 hour prior to ED arrival.  She states that she was at home when all of a sudden, she felt like "somebody took a hammer to her chest."  Patient states that she felt like her back got sweaty and nauseous with the pain additionally, she states she had difficulty breathing and tingling sensation in her left upper extremity. Patient reports she did not take any medications at home.  Patient states that the chest pain felt like a pressure across her chest. She described as a "heaviness."  She states that she has never experienced anything like this before.  She states that when she would walk around, shortness of breath and pain would get worse.  She cannot recall if it was worse with deep inspiration as he states she was just having trouble "breathing and catching her breath." She denies any personal cardiac history but she states that her dad had a heart attack at 61 and her mom has had 4 heart attacks starting at age 85.  She has never had cardiac evaluation.  Patient reports she is a former smoker but has not smoked since March.  She denies any cocaine or IV drug use.  She does have a history of hypertension but denies any diabetes.  Additionally, patient reports she has had DVTs previously.  She is not currently on any blood thinners given the fact that she had a spontaneous cerebral hemorrhage.  She has not had any recent surgeries but was admitted to the hospital proximally 1 month ago.  She states she has noticed some swelling in both of her legs and feet but denies any unilateral  swelling.  Denies any recent travel.  The history is provided by the patient.    Past Medical History:  Diagnosis Date  . Asthma   . Cerebral hemorrhage ([place]) 1989  . Chronic back pain   . COPD (chronic obstructive pulmonary disease) ([place])   . DDD (degenerative disc disease) 06/12/2013  . DVT (deep venous thrombosis) ([place])    "she's had several since 1990"  . GERD (gastroesophageal reflux disease)   . Headache    "usually around time when she's had a seizure"  . History of blood transfusion    "when she was a baby"  . History of stomach ulcers   . Kidney stones   . Seizures ([place])    "because of her brain surgery"  . Stroke [facility]) [phone]   family denies residual on 11/09/2014  . Tunnel vision    "since brain OR"    Patient Active Problem List   Diagnosis Date Noted  . Orthostasis   . Tobacco abuse   . Left-sided weakness 03/19/2018  . Atypical chest pain 02/17/2017  . Localized swelling of lower extremity 02/17/2017  . GERD (gastroesophageal reflux disease) 02/17/2017  . Costochondritis 02/17/2017  . Seizures ([place]) 11/02/2015  . Gait disturbance 11/02/2015  . Right clavicle fracture 11/02/2015  . Infection of urinary tract 11/02/2015  . Chest pain 03/31/2015  . Acute on chronic respiratory failure with hypoxia ([place]) 03/31/2015  . SOB (shortness  of breath) 03/31/2015  . Anxiety state 03/31/2015  . Malnutrition of moderate degree ([place]) 03/31/2015  . COPD exacerbation ([place]) 03/30/2015  . Hypoxia 01/18/2015  . Chronic obstructive pulmonary disease with acute exacerbation ([place])   . Fever 01/17/2015  . Cough 01/17/2015  . Sepsis ([place]) 01/17/2015  . COPD (chronic obstructive pulmonary disease) ([place])   . Unintentional weight loss 01/11/2015  . Asthma 05/15/2014  . Seizure ([place]) 05/15/2014  . Protein calorie malnutrition ([place]) 05/15/2014  . DVT of leg (deep venous thrombosis) ([place]) 06/19/2013  . DDD (degenerative disc disease) 06/12/2013  . Paresthesias 06/12/2013  .  Tremor 06/09/2013  . Thrombocytopenia- chronic 06/29/2012  . Noncompliance with medication treatment due to underuse of medication 06/29/2012  . Stress-personal 06/29/2012  . Recurrent seizures ([place]) 06/29/2012  . Tobacco dependence 06/28/2012  . Skull fracture ([place]) 02/28/2012  . Dilantin toxicity 02/28/2012  . Seizure disorder ([place]) 02/28/2012  . History of Intracranial bleed 02/28/2012    Past Surgical History:  Procedure Laterality Date  . [place]   craniotomy with hematoma evacuation  . CESAREAN SECTION  1979; 1987; 1989  . HEMORRHOID SURGERY    . TUBAL LIGATION  1990's     OB History   None      Home Medications    Prior to Admission medications   Medication Sig Start Date End Date Taking? Authorizing Provider  acetaminophen (TYLENOL) 325 MG tablet Take 325 mg by mouth daily as needed for headache (pain).    Yes [provider]  albuterol (PROVENTIL HFA;VENTOLIN HFA) 108 (90 Base) MCG/ACT inhaler Inhale 2 puffs into the lungs every 6 (six) hours as needed for wheezing or shortness of breath.   Yes [provider]  gabapentin (NEURONTIN) 300 MG capsule Take 2 capsules every night Patient taking differently: Take 300 mg by mouth at bedtime.  04/07/18  Yes [name], MD  lacosamide (VIMPAT) 200 MG TABS tablet Take 1 tablet (200 mg total) by mouth 2 (two) times daily. 04/07/18  Yes [name], MD  phenytoin (DILANTIN) 100 MG ER capsule Take 1 capsule (100 mg total) by mouth daily. Patient taking differently: Take 100-200 mg by mouth See admin instructions. Take one capsule (100 mg) by mouth every morning with two 30 mg capsules for a total morning dose of 160 mg, take two capsules (200 mg) by mouth every night. 03/26/18  Yes [name], MD  phenytoin (DILANTIN) 30 MG ER capsule Take 60 mg by mouth See admin instructions. Take two capsules (60 mg) by mouth every morning with a 100 mg capsule for a total morning dose of 160 mg   Yes [provider]  phenytoin (DILANTIN) 200 MG ER capsule Take 1 capsule (200 mg total) by mouth at bedtime. Patient not taking: Reported on 05/23/2018 03/25/18   [name], MD  vitamin B-12 (CYANOCOBALAMIN) 1000 MCG tablet Take 1 tablet (1,000 mcg total) by mouth daily. Patient not taking: Reported on 05/23/2018 03/25/18   [name], MD    Family History Family History  Problem Relation Age of Onset  . Heart attack Mother        4 MIs, started in her 41s, also vaginal cancer and colon cancer  . Cancer Mother   . Heart attack Father        Died suddenly age 69 - autopsy revealed heart attack her patient  . Cancer Brother        Sinus cancer    Social History  Social History   Tobacco Use  . Smoking status: Former Smoker    Packs/day: 0.50    Years: 20.00    Pack years: 10.00    Types: Cigarettes    Last attempt to quit: 10/26/2017    Years since quitting: 0.5  . Smokeless tobacco: Never Used  . Tobacco comment: Previously smoked 2 ppd, down to 4 cigs/day  Substance Use Topics  . Alcohol use: No  . Drug use: No     Allergies   Zonisamide; Codeine; Keppra [levetiracetam]; Pregabalin; and Olive oil   Review of Systems Review of Systems  Constitutional: Negative for fever.  Respiratory: Positive for shortness of breath. Negative for cough.   Cardiovascular: Positive for chest pain and leg swelling (bilateral).  Gastrointestinal: Negative for abdominal pain, nausea and vomiting.  Genitourinary: Negative for dysuria and hematuria.  Neurological: Positive for numbness. Negative for weakness and headaches.  All other systems reviewed and are negative.    Physical Exam Updated Vital Signs BP 117/66   Pulse 81   Temp 98.7 F (37.1 C) (Oral)   Resp 18   Ht 5\' 7"  (1.702 m)   Wt 59 kg   SpO2 94%   BMI 20.36 kg/m   Physical Exam  Constitutional: She is oriented to person, place, and time. She appears well-developed and well-nourished.  Appears uncomfortable but no acute  distress   HENT:  Head: Normocephalic and atraumatic.  Mouth/Throat: Oropharynx is clear and moist and mucous membranes are normal.  Eyes: Pupils are equal, round, and reactive to light. Conjunctivae, EOM and lids are normal.  Neck: Full passive range of motion without pain.  Cardiovascular: Normal rate, regular rhythm, normal heart sounds and normal pulses. Exam reveals no gallop and no friction rub.  No murmur heard. Pulses:      Radial pulses are 2+ on the right side, and 2+ on the left side.       Dorsalis pedis pulses are 2+ on the right side, and 2+ on the left side.  Pulmonary/Chest: Effort normal. She has rales.  Lungs clear to auscultation bilaterally.  Symmetric chest rise. Good air movement.  Small amount of crackles particularly at the bases. No wheezing, rhonchi.  Pain reproduced with palpation the midsternal chest.  Abdominal: Soft. Normal appearance. There is no tenderness. There is no rigidity and no guarding.  Abdomen is soft, non-distended, non-tender. No rigidity, No guarding. No peritoneal signs.  Musculoskeletal: Normal range of motion.  Bilateral lower extremities appear symmetric in appearance without any signs of erythema.  Neurological: She is alert and oriented to person, place, and time.  Follows commands, Moves all extremities  5/5 strength to BUE and BLE  Sensation intact throughout all major nerve distributions  Skin: Skin is warm and dry. Capillary refill takes less than 2 seconds.  Psychiatric: She has a normal mood and affect. Her speech is normal.  Nursing note and vitals reviewed.    ED Treatments / Results  Labs (all labs ordered are listed, but only abnormal results are displayed) Labs Reviewed  CBC - Abnormal; Notable for the following components:      Result Value   MCV 101.2 (*)    Platelets 147 (*)    All other components within normal limits  BASIC METABOLIC PANEL  I-STAT TROPONIN, ED  I-STAT BETA HCG BLOOD, ED ([facility], [facility], [facility] ONLY)     EKG EKG Interpretation  Date/Time:  Sunday May 23 2018 16:41:39 EDT Ventricular Rate:  86 PR Interval:  142 QRS Duration: 72 QT Interval:  406 QTC Calculation: 485 R Axis:   85 Text Interpretation:  Normal sinus rhythm Right atrial enlargement Prolonged QT Abnormal ECG Since last tracing rate faster Baseline wander background noise TECHNICALLY DIFFICULT Otherwise no significant change Confirmed by Deno Etienne [phone]) on 05/23/2018 6:00:13 PM   Radiology Dg Chest 2 View  Result Date: 05/23/2018 CLINICAL DATA:  Central chest pain radiating to left arm. EXAM: CHEST - 2 VIEW COMPARISON:  Body CT 03/24/2018 FINDINGS: Cardiomediastinal silhouette is normal. Mediastinal contours appear intact. Calcific atherosclerotic disease the aorta. There is no evidence of focal airspace consolidation, pleural effusion or pneumothorax. Minimal right middle lobe atelectasis. Emphysematous changes and hyperinflation of the lungs. Osseous structures are without acute abnormality. Exaggerated thoracic kyphosis. Posttraumatic deformity of the right clavicle. Soft tissues are grossly normal. IMPRESSION: Emphysematous changes and hyperinflation of the lungs. Improved right middle lobe atelectasis. Calcific atherosclerotic disease of the aorta. Electronically Signed   By: Fidela Salisbury M.D.   On: 05/23/2018 17:30   Ct Angio Chest Pe W And/or Wo Contrast  Result Date: 05/23/2018 CLINICAL DATA:  Chest pain. EXAM: CT ANGIOGRAPHY CHEST WITH CONTRAST TECHNIQUE: Multidetector CT imaging of the chest was performed using the standard protocol during bolus administration of intravenous contrast. Multiplanar CT image reconstructions and MIPs were obtained to evaluate the vascular anatomy. CONTRAST:  176mL ISOVUE-370 IOPAMIDOL (ISOVUE-370) INJECTION 76% COMPARISON:  Radiographs of same day.  CT scan of March 24, 2018. FINDINGS: Cardiovascular: Satisfactory opacification of the pulmonary arteries to the segmental  level. No evidence of pulmonary embolism. Normal heart size. No pericardial effusion. Atherosclerosis of thoracic aorta is noted without aneurysm or dissection. Mediastinum/Nodes: No enlarged mediastinal, hilar, or axillary lymph nodes. Thyroid gland, trachea, and esophagus demonstrate no significant findings. Lungs/Pleura: Lungs are clear. No pleural effusion or pneumothorax. Upper Abdomen: No acute abnormality. Musculoskeletal: No chest wall abnormality. No acute or significant osseous findings. Review of the MIP images confirms the above findings. IMPRESSION: No definite evidence of pulmonary embolus. No acute abnormality seen in the chest. Aortic Atherosclerosis (ICD10-I70.0). Electronically Signed   By: Marijo Conception, M.D.   On: 05/23/2018 19:37    Procedures Procedures (including critical care time)  Medications Ordered in ED Medications  nitroGLYCERIN (NITROSTAT) SL tablet 0.4 mg (0.4 mg Sublingual Given 05/23/18 1930)  sodium chloride 0.9 % bolus 1,000 mL (0 mLs Intravenous Stopped 05/23/18 2012)  ipratropium-albuterol (DUONEB) 0.5-2.5 (3) MG/3ML nebulizer solution 3 mL (3 mLs Nebulization Given 05/23/18 1829)  iopamidol (ISOVUE-370) 76 % injection 100 mL (100 mLs Intravenous Contrast Given 05/23/18 1901)  methylPREDNISolone sodium succinate (SOLU-MEDROL) 125 mg/2 mL injection 125 mg (125 mg Intravenous Given 05/23/18 2048)  ipratropium-albuterol (DUONEB) 0.5-2.5 (3) MG/3ML nebulizer solution 3 mL (3 mLs Nebulization Given 05/23/18 2049)  LORazepam (ATIVAN) injection 0.5 mg (0.5 mg Intravenous Given 05/23/18 2056)     Initial Impression / Assessment and Plan / ED Course  I have reviewed the triage vital signs and the nursing notes.  Pertinent labs & imaging results that were available during my care of the patient were reviewed by me and considered in my medical decision making (see chart for details).     62 year old female who presents for evaluation of chest pain and shortness  of breath that began approximately 1 hour ago.  States it feels like a pressure.  Associated with some diaphoresis, nausea, difficulty breathing, left upper extremity tingling.  No personal cardiac history but dad had a heart attack at 60  and mom is had 4 heart attacks that began at age 79.  On ED arrival, she is still having the pain that she describes as a pressure across her to her entire chest.  She does have tenderness palpation in the midsternal area.  On initial ED arrival, patient is afebrile.  Vital signs stable and reviewed.  Consider ACS etiology versus infectious etiology versus respiratory etiology.  History/physical exam is not concerning for aortic dissection.  Additionally, must consider PE given patient's history of blood clots and the fact that she has not been on any blood thinners.  Initial troponin negative.  CBC without any significant leukocytosis or anemia.  BMP is unremarkable.  Chest x-ray shows emphysema changes.  No acute infectious etiology.  Given patient's history of DVTs and the fact that she is currently not on any blood thinners and describing chest pain and shortness of breath, will plan for CTA of chest to make sure this is not a PE.  She did have one episode of hypoxia.  Not currently on oxygen at home.  Review of patient's records show that she had been admitted for similar chest pain in 2018.  At that time, is unclear if this was COPD exacerbation.  She did have cardiac evaluation at that time who felt like it was more likely to be COPD exacerbation rather than cardiac.  She did not have any stress test or catheterization done at that time.  Additionally, patient has not had any outpatient cardiology follow-up.  Patient has a heart score of 4 given her risk factors, family history.  Reevaluation.  Patient reports some improvement after nitro but states she is still having pressure in her chest associated with shortness of breath.  Unclear if this is cardiac in nature or  COPD exacerbation.  Given heart score of 4, will plan for admission for serial troponins, cardiac evaluation.   Discussed patient with Dr. Tomie China (hospitalist). Will accept patient for admission.   Final Clinical Impressions(s) / ED Diagnoses   Final diagnoses:  Atypical chest pain  Shortness of breath    ED Discharge Orders    None       Desma Mcgregor 05/23/18 2101    Deno Etienne, DO 05/23/18 2351

## 2018-05-23 NOTE — Progress Notes (Signed)
   05/23/18 2232  Vitals  Temp 97.9 F (36.6 C)  Temp Source Oral  BP 108/67  MAP (mmHg) 79  BP Method Automatic  Patient Position (if appropriate) Lying  Pulse Rate 79  Pulse Rate Source Monitor  Resp 18  Oxygen Therapy  SpO2 98 %  O2 Device Nasal Cannula  O2 Flow Rate (L/min) 2 L/min   Admitted pt to room 27.  Pt is alert and oriented.  Placed on cardiac monitor.  CCMD made aware.  Oriented to room and call bell within reach.  Placed on seizure precautions per order and seizure pad ordered.

## 2018-05-23 NOTE — H&P (Addendum)
History and Physical    [name] MGQ:[id number] DOB: [birth year] DOA: 05/23/2018  Referring MD/NP/PA:   PCP: [name] Gravel, MD   Patient coming from:  The patient is coming from home.  At baseline, pt is independent for most of ADL.  Chief Complaint: Chest pain shortness of breath  HPI: [name] is a 62 y.o. 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  Patient states that her chest pain started this in evening, which is located in the central chest, initially 10 out of 10 severity, currently 2 out of 10 in severity, sharp, radiating to the left arm, pleuritic, aggravated by deep breath.  It is associated with shortness of breath and mild dry cough.  She has nausea, but no vomiting, diarrhea or abdominal pain.  No tenderness in the calf areas.  Denies fever or chills.  Patient does not have symptoms of UTI or unilateral weakness.  Patient states that she feels lightheaded at home. Of note, patient has history of intracranial bleeding, anal was told by neurologist that she should not take aspirin per patient.  ED Course: pt was found to have negative troponin, negative pregnancy test, WBC 5.1, electrolytes renal function okay, temperature normal, currently blood pressure 117/66, heart rate 81, RR 21, oxygen saturation 94% on room air (patient had oxygen saturation 88 initially), chest x-ray showed erythematous change.  CT angiogram is negative for PE.  Review of Systems:   General: no fevers, chills, no body weight gain, has fatigue HEENT: no blurry vision, hearing changes or sore throat Respiratory: has dyspnea, coughing, no wheezing CV: has chest pain, no palpitations GI: no nausea, vomiting, abdominal pain, diarrhea, constipation GU: no dysuria, burning on urination, increased urinary frequency, hematuria  Ext: has mild leg edema Neuro: no unilateral weakness,  numbness, or tingling, no vision change or hearing loss Skin: no rash, no skin tear. MSK: No muscle spasm, no deformity, no limitation of range of movement in spin Heme: No easy bruising.  Travel history: No recent long distant travel.  Allergy:  Allergies  Allergen Reactions  . Zonisamide Other (See Comments)    Numbness and tingling over whole body  . Codeine Nausea And Vomiting  . Keppra [Levetiracetam] Other (See Comments)    Causes seizures.   . Pregabalin Nausea And Vomiting  . Olive Oil Rash    Past Medical History:  Diagnosis Date  . Asthma   . Cerebral hemorrhage ([place]) 1989  . Chronic back pain   . COPD (chronic obstructive pulmonary disease) ([place])   . DDD (degenerative disc disease) 06/12/2013  . DVT (deep venous thrombosis) ([place])    "she's had several since 1990"  . GERD (gastroesophageal reflux disease)   . Headache    "usually around time when she's had a seizure"  . History of blood transfusion    "when she was a baby"  . History of stomach ulcers   . Kidney stones   . Seizures ([place])    "because of her brain surgery"  . Stroke [facility]) [phone]   family denies residual on 11/09/2014  . Tunnel vision    "since brain OR"    Past Surgical History:  Procedure Laterality Date  . [place]   craniotomy with hematoma evacuation  . CESAREAN SECTION  1979; 1987; 1989  . HEMORRHOID SURGERY    . TUBAL LIGATION  1990's    Social  History:  reports that she quit smoking about 6 months ago. Her smoking use included cigarettes. She has a 10.00 pack-year smoking history. She has never used smokeless tobacco. She reports that she does not drink alcohol or use drugs.  Family History:  Family History  Problem Relation Age of Onset  . Heart attack Mother        4 MIs, started in her 90s, also vaginal cancer and colon cancer  . Cancer Mother   . Heart attack Father        Died suddenly age 32 - autopsy revealed heart attack her patient  . Cancer Brother         Sinus cancer     Prior to Admission medications   Medication Sig Start Date End Date Taking? Authorizing Provider  acetaminophen (TYLENOL) 325 MG tablet Take 325 mg by mouth daily as needed for headache (pain).    Yes [provider]  albuterol (PROVENTIL HFA;VENTOLIN HFA) 108 (90 Base) MCG/ACT inhaler Inhale 2 puffs into the lungs every 6 (six) hours as needed for wheezing or shortness of breath.   Yes [provider]  gabapentin (NEURONTIN) 300 MG capsule Take 2 capsules every night Patient taking differently: Take 300 mg by mouth at bedtime.  04/07/18  Yes [name], MD  lacosamide (VIMPAT) 200 MG TABS tablet Take 1 tablet (200 mg total) by mouth 2 (two) times daily. 04/07/18  Yes [name], MD  phenytoin (DILANTIN) 100 MG ER capsule Take 1 capsule (100 mg total) by mouth daily. Patient taking differently: Take 100-200 mg by mouth See admin instructions. Take one capsule (100 mg) by mouth every morning with two 30 mg capsules for a total morning dose of 160 mg, take two capsules (200 mg) by mouth every night. 03/26/18  Yes [name], MD  phenytoin (DILANTIN) 30 MG ER capsule Take 60 mg by mouth See admin instructions. Take two capsules (60 mg) by mouth every morning with a 100 mg capsule for a total morning dose of 160 mg   Yes [provider]  phenytoin (DILANTIN) 200 MG ER capsule Take 1 capsule (200 mg total) by mouth at bedtime. Patient not taking: Reported on 05/23/2018 03/25/18   [name], MD  vitamin B-12 (CYANOCOBALAMIN) 1000 MCG tablet Take 1 tablet (1,000 mcg total) by mouth daily. Patient not taking: Reported on 05/23/2018 03/25/18   [name], MD    Physical Exam: Vitals:   05/23/18 2015 05/23/18 2030 05/23/18 2045 05/23/18 2116  BP: 122/87 115/63 121/75 (!) 111/59  Pulse: 81 71 74 79  Resp: 10 17 (!) 23 20  Temp:      TempSrc:      SpO2: 97% 96% 98% 96%  Weight:      Height:       General: Not in acute distress. Pt is anxious in  ED HEENT:       Eyes: PERRL, EOMI, no scleral icterus.       ENT: No discharge from the ears and nose, no pharynx injection, no tonsillar enlargement.        Neck: No JVD, no bruit, no mass felt. Heme: No neck lymph node enlargement. Cardiac: S1/S2, RRR, No murmurs, No gallops or rubs. Respiratory: Slightly decreased air movement bilaterally. No rales, wheezing, rhonchi or rubs. GI: Soft, nondistended, nontender, no rebound pain, no organomegaly, BS present. GU: No hematuria Ext: has trace leg edema bilaterally. 2+DP/PT pulse bilaterally. Musculoskeletal: No joint deformities, No joint redness or warmth,  no limitation of ROM in spin. Skin: No rashes.  Neuro: Alert, oriented X3, cranial nerves II-XII grossly intact, moves all extremities normally.  Psych: Patient is not psychotic, no suicidal or hemocidal ideation.  Labs on Admission: I have personally reviewed following labs and imaging studies  CBC: Recent Labs  Lab 05/23/18 1647  WBC 5.3  HGB 13.7  HCT 41.8  MCV 101.2*  PLT 825*   Basic Metabolic Panel: Recent Labs  Lab 05/23/18 1647  NA 140  K 3.9  CL 109  CO2 23  GLUCOSE 88  BUN 11  CREATININE 0.70  CALCIUM 9.1   GFR: Estimated Creatinine Clearance: 69.7 mL/min (by C-G formula based on SCr of 0.7 mg/dL). Liver Function Tests: No results for input(s): AST, ALT, ALKPHOS, BILITOT, PROT, ALBUMIN in the last 168 hours. No results for input(s): LIPASE, AMYLASE in the last 168 hours. No results for input(s): AMMONIA in the last 168 hours. Coagulation Profile: No results for input(s): INR, PROTIME in the last 168 hours. Cardiac Enzymes: No results for input(s): CKTOTAL, CKMB, CKMBINDEX, TROPONINI in the last 168 hours. BNP (last 3 results) No results for input(s): PROBNP in the last 8760 hours. HbA1C: No results for input(s): HGBA1C in the last 72 hours. CBG: No results for input(s): GLUCAP in the last 168 hours. Lipid Profile: No results for input(s): CHOL,  HDL, LDLCALC, TRIG, CHOLHDL, LDLDIRECT in the last 72 hours. Thyroid Function Tests: No results for input(s): TSH, T4TOTAL, FREET4, T3FREE, THYROIDAB in the last 72 hours. Anemia Panel: No results for input(s): VITAMINB12, FOLATE, FERRITIN, TIBC, IRON, RETICCTPCT in the last 72 hours. Urine analysis:    Component Value Date/Time   COLORURINE STRAW (A) 03/19/2018 1848   APPEARANCEUR CLEAR 03/19/2018 1848   LABSPEC 1.004 (L) 03/19/2018 1848   PHURINE 7.0 03/19/2018 1848   GLUCOSEU NEGATIVE 03/19/2018 1848   HGBUR NEGATIVE 03/19/2018 1848   BILIRUBINUR NEGATIVE 03/19/2018 1848   KETONESUR NEGATIVE 03/19/2018 1848   PROTEINUR NEGATIVE 03/19/2018 1848   UROBILINOGEN 0.2 01/17/2015 2350   NITRITE NEGATIVE 03/19/2018 1848   LEUKOCYTESUR NEGATIVE 03/19/2018 1848   Sepsis Labs: @LABRCNTIP (procalcitonin:4,lacticidven:4) )No results found for this or any previous visit (from the past 240 hour(s)).   Radiological Exams on Admission: Dg Chest 2 View  Result Date: 05/23/2018 CLINICAL DATA:  Central chest pain radiating to left arm. EXAM: CHEST - 2 VIEW COMPARISON:  Body CT 03/24/2018 FINDINGS: Cardiomediastinal silhouette is normal. Mediastinal contours appear intact. Calcific atherosclerotic disease the aorta. There is no evidence of focal airspace consolidation, pleural effusion or pneumothorax. Minimal right middle lobe atelectasis. Emphysematous changes and hyperinflation of the lungs. Osseous structures are without acute abnormality. Exaggerated thoracic kyphosis. Posttraumatic deformity of the right clavicle. Soft tissues are grossly normal. IMPRESSION: Emphysematous changes and hyperinflation of the lungs. Improved right middle lobe atelectasis. Calcific atherosclerotic disease of the aorta. Electronically Signed   By: Fidela Salisbury M.D.   On: 05/23/2018 17:30   Ct Angio Chest Pe W And/or Wo Contrast  Result Date: 05/23/2018 CLINICAL DATA:  Chest pain. EXAM: CT ANGIOGRAPHY CHEST WITH  CONTRAST TECHNIQUE: Multidetector CT imaging of the chest was performed using the standard protocol during bolus administration of intravenous contrast. Multiplanar CT image reconstructions and MIPs were obtained to evaluate the vascular anatomy. CONTRAST:  141mL ISOVUE-370 IOPAMIDOL (ISOVUE-370) INJECTION 76% COMPARISON:  Radiographs of same day.  CT scan of March 24, 2018. FINDINGS: Cardiovascular: Satisfactory opacification of the pulmonary arteries to the segmental level. No evidence of pulmonary embolism. Normal  heart size. No pericardial effusion. Atherosclerosis of thoracic aorta is noted without aneurysm or dissection. Mediastinum/Nodes: No enlarged mediastinal, hilar, or axillary lymph nodes. Thyroid gland, trachea, and esophagus demonstrate no significant findings. Lungs/Pleura: Lungs are clear. No pleural effusion or pneumothorax. Upper Abdomen: No acute abnormality. Musculoskeletal: No chest wall abnormality. No acute or significant osseous findings. Review of the MIP images confirms the above findings. IMPRESSION: No definite evidence of pulmonary embolus. No acute abnormality seen in the chest. Aortic Atherosclerosis (ICD10-I70.0). Electronically Signed   By: Marijo Conception, M.D.   On: 05/23/2018 19:37     EKG: Independently reviewed.  Sinus rhythm, QTC 449, low voltage, no ischemic change.  Assessment/Plan Principal Problem:   Chest pain Active Problems:   Seizure (HCC)   COPD (chronic obstructive pulmonary disease) (HCC)   GERD (gastroesophageal reflux disease)  Chest pain: Patient has atypical chest pain, which is pleuritic. Etiology is not clear. CT angiogram is negative for PE.  Patient is anxious, may be related to anxiety. Initial troponin negative.  EKG has no ischemic change.  - will place on Tele bed for obs - prn Xanax - cycle CE q6 x3 and repeat EKG in the am  - prn Nitroglycerin, Morphine - pt refused ASA due to hx of ICH - Risk factor stratification: will check FLP,  UDS and A1C  - did not order 2d echo--> please reevaluate pt in AM to decide if pt needs 2D echo.  COPD: Patient has slightly decreased air movement, but no wheezing or rhonchi on auscultation.  No productive cough, does not seem to have COPD exacerbation. -DuoNeb nebulizers -PRN albuterol nebulizers -As needed Mucinex for cough  Seizure -Seizure precaution -When necessary Ativan for seizure -Continue Home medications: Vimpat, Dilantin -check Dilantin level  Addendum: Phenytoin level is elevated 26 (therapeutic range should be 10-20) - Hold off phenytoin  GERD: -Protonix prn    DVT ppx:  SQ Lovenox Code Status: Full code Family Communication: None at bed side.   Disposition Plan:  Anticipate discharge back to previous home environment Consults called:  none Admission status: Obs / tele   Date of Service 05/23/2018    Ivor Costa [facility] Pager [phone]  If 7PM-7AM, please contact night-coverage [email or web address] Password [email or web address] 05/23/2018, 10:01 PM

## 2018-05-23 NOTE — Progress Notes (Signed)
Seizure pads not available for [place] bed per materials.

## 2018-05-24 ENCOUNTER — Observation Stay (HOSPITAL_COMMUNITY): Payer: Medicare HMO

## 2018-05-24 DIAGNOSIS — K219 Gastro-esophageal reflux disease without esophagitis: Secondary | ICD-10-CM | POA: Diagnosis not present

## 2018-05-24 DIAGNOSIS — F411 Generalized anxiety disorder: Secondary | ICD-10-CM | POA: Diagnosis not present

## 2018-05-24 DIAGNOSIS — J441 Chronic obstructive pulmonary disease with (acute) exacerbation: Secondary | ICD-10-CM | POA: Diagnosis not present

## 2018-05-24 DIAGNOSIS — R270 Ataxia, unspecified: Secondary | ICD-10-CM | POA: Diagnosis not present

## 2018-05-24 DIAGNOSIS — I700 Atherosclerosis of aorta: Secondary | ICD-10-CM | POA: Diagnosis not present

## 2018-05-24 DIAGNOSIS — R0789 Other chest pain: Secondary | ICD-10-CM | POA: Diagnosis not present

## 2018-05-24 DIAGNOSIS — Z23 Encounter for immunization: Secondary | ICD-10-CM | POA: Diagnosis not present

## 2018-05-24 DIAGNOSIS — R0602 Shortness of breath: Secondary | ICD-10-CM | POA: Diagnosis not present

## 2018-05-24 DIAGNOSIS — G40909 Epilepsy, unspecified, not intractable, without status epilepticus: Secondary | ICD-10-CM | POA: Diagnosis not present

## 2018-05-24 DIAGNOSIS — D696 Thrombocytopenia, unspecified: Secondary | ICD-10-CM | POA: Diagnosis not present

## 2018-05-24 LAB — TROPONIN I
Troponin I: <0.03 ng/mL (ref <0.03)
Troponin I: <0.03 ng/mL (ref <0.03)

## 2018-05-24 LAB — PHENYTOIN LEVEL, TOTAL: PHENYTOIN LVL: 26 ug/mL — AB (ref 10.0–20.0)

## 2018-05-24 LAB — BRAIN NATRIURETIC PEPTIDE: B Natriuretic Peptide: 55.6 pg/mL (ref 0.0–100.0)

## 2018-05-24 LAB — LIPID PANEL
CHOLESTEROL: 161 mg/dL (ref 0–200)
HDL: 74 mg/dL (ref >40)
LDL Cholesterol: 81 mg/dL (ref 0–99)
TRIGLYCERIDES: 31 mg/dL (ref <150)
Total CHOL/HDL Ratio: 2.2 RATIO
VLDL: 6 mg/dL (ref 0–40)

## 2018-05-24 LAB — ALBUMIN: ALBUMIN: 3.6 g/dL (ref 3.5–5.0)

## 2018-05-24 MED ORDER — SODIUM CHLORIDE 0.9 % IV SOLN
INTRAVENOUS | Status: DC
  Administered 2018-05-24: 02:00:00 via INTRAVENOUS

## 2018-05-24 MED ORDER — PREDNISONE 50 MG PO TABS
50.0000 mg | ORAL_TABLET | Freq: Every day | ORAL | Status: AC
  Administered 2018-05-24 – 2018-05-25 (×2): 50 mg via ORAL
  Filled 2018-05-24 (×2): qty 1

## 2018-05-24 MED ORDER — ALUM & MAG HYDROXIDE-SIMETH 200-200-20 MG/5ML PO SUSP
30.0000 mL | Freq: Once | ORAL | Status: AC
  Administered 2018-05-24: 30 mL via ORAL
  Filled 2018-05-24: qty 30

## 2018-05-24 MED ORDER — KETOROLAC TROMETHAMINE 10 MG PO TABS
10.0000 mg | ORAL_TABLET | Freq: Four times a day (QID) | ORAL | Status: DC | PRN
  Administered 2018-05-24 – 2018-05-26 (×5): 10 mg via ORAL
  Filled 2018-05-24 (×6): qty 1

## 2018-05-24 MED ORDER — KETOROLAC TROMETHAMINE 10 MG PO TABS: 10.0000 mg | ORAL_TABLET | Freq: Four times a day (QID) | ORAL | 0 refills | Status: DC | PRN

## 2018-05-24 MED ORDER — PREDNISONE 50 MG PO TABS: 50.0000 mg | ORAL_TABLET | Freq: Every day | ORAL | Status: DC

## 2018-05-24 MED ORDER — IPRATROPIUM-ALBUTEROL 0.5-2.5 (3) MG/3ML IN SOLN
3.0000 mL | Freq: Three times a day (TID) | RESPIRATORY_TRACT | Status: DC
  Administered 2018-05-24 – 2018-05-26 (×7): 3 mL via RESPIRATORY_TRACT
  Filled 2018-05-24 (×8): qty 3

## 2018-05-24 NOTE — Plan of Care (Signed)

## 2018-05-24 NOTE — Discharge Summary (Signed)
Physician Discharge Summary  BRAYLYN EYE [id number] DOB: Nov 01, 1955 DOA: 05/23/2018  PCP: [name], MD  Admit date: 05/23/2018 Discharge date: 05/24/2018  Time spent: 45 minutes  Recommendations for Outpatient Follow-up:  1. Dr Julianne Rice office will contact patient to schedule lab for Dilantin level 10/24 2. Patient is to hold dilantin until results back and MD makes recommendations    Discharge Diagnoses:  Principal Problem:   Chest pain Active Problems:   COPD with exacerbation ([place])   Anxiety state   GERD (gastroesophageal reflux disease)   Tobacco abuse   Seizure ([place])   Discharge Condition: stable  Diet recommendation: regular  Filed Weights   05/23/18 2225 05/23/18 2237 05/24/18 0555  Weight: 57.3 kg 57.2 kg 57.4 kg    History of present illness:  [name] is a 62 y.o. female with medical history significant of COPD, ICH, DVT not on anticoagulants, gastric ulcer, seizure, GERD, who presented to ed on 10/20 with chest pain shortness of breath.  Patient stated that her chest pain started that evening, which is located in the central chest, initially 10 out of 10 severity, currently 2 out of 10 in severity, sharp, radiating to the left arm, pleuritic, aggravated by deep breath.  It is associated with shortness of breath and mild dry cough.  She had nausea, but no vomiting, diarrhea or abdominal pain.  No tenderness in the calf areas.  Denied fever or chills.  Patient did not have symptoms of UTI or unilateral weakness.  Patient stated that she fellt lightheaded at home. Of note, patient has history of intracranial bleeding, and was told by neurologist that she should not take aspirin per patient.  Hospital Course:   Chest pain: Patient has atypical chest pain, which is pleuritic. May be related to mild copd exacerbation.  CT angiogram is negative for PE. Troponin negative x3 and ekg without changes. No events on tele Pain free this am.   Patient is anxious, may  be related to anxiety.  -mylanta - follow up with PCP 1-2 weeks for evaluation of chest pain  COPD: Patient has slightly decreased air movement.  No productive cough, did not seem to have COPD exacerbation but was provided solumedrol per ED md. Chest xray as noted above -DuoNeb nebulizers -scheduled albuterol nebulizers -As needed Mucinex for cough  Seizure/elevated dilantin level -hx of same. Dilantin level 26. Last hospitalization dilantin level 33.  -hold dilantin -spoke with Dr Julianne Rice office who will contact patient for lab work to be drawn 10/24. Will need dilantin level. Results will need to be evaluation and recommendations for new dose made  GERD: -Protonix prn   Procedures:  Consultations:    Discharge Exam: Vitals:   05/24/18 1225 05/24/18 1457  BP: (!) 104/50   Pulse: 74   Resp: 17   Temp: 98.8 F (37.1 C)   SpO2: 98% 98%    General: alert and oriented x3. Appears anxious and older than stated age Cardiovascular: RRR no MGR no LE edema Respiratory: normal effort BS quite distant no wheeze no rhonchi  Discharge Instructions   Discharge Instructions    AMB Referral to [facility]   Complete by:  As directed    Please assign patient to social worker for post hospital follow up for ongoing transportation issues to primary and food resource.  Please see if patient qualifies for [facility] transportation and [employer] programs. [[facility] CSW had worked on [place].  Patient would like Pharmacist follow up regarding Dilantin  follow up. Questions please call:   Natividad Brood, RN BSN [facility] Liaison  [phone] business mobile phone Toll free office [phone]    Natividad Brood, RN BSN [facility] Liaison  [phone] business mobile phone Toll free office [phone]   Reason for consult:  Transportation issues ongoing and Medication assessment   Diagnoses of:  Other   Other Diagnosis:  Seizure DO    Expected date of contact:  1-3 days (reserved for hospital discharges)   Call MD for:  persistant nausea and vomiting   Complete by:  As directed    Diet - low sodium heart healthy   Complete by:  As directed    Discharge instructions   Complete by:  As directed    Hold Dilantin until results of labwork to be done on Thursday are know Dr Julianne Rice office will call you to schedule appointment for Thursday 10/24 to have labs drawn Call Dr Julianne Rice office for results and new dilantin dose   Increase activity slowly   Complete by:  As directed      Allergies as of 05/24/2018      Reactions   Zonisamide Other (See Comments)   Numbness and tingling over whole body   Codeine Nausea And Vomiting   Keppra [levetiracetam] Other (See Comments)   Causes seizures.    Pregabalin Nausea And Vomiting   Olive Oil Rash      Medication List    STOP taking these medications   phenytoin 100 MG ER capsule Commonly known as:  DILANTIN   phenytoin 200 MG ER capsule Commonly known as:  DILANTIN   phenytoin 30 MG ER capsule Commonly known as:  DILANTIN   vitamin B-12 1000 MCG tablet Commonly known as:  CYANOCOBALAMIN     TAKE these medications   acetaminophen 325 MG tablet Commonly known as:  TYLENOL Take 325 mg by mouth daily as needed for headache (pain).   albuterol 108 (90 Base) MCG/ACT inhaler Commonly known as:  PROVENTIL HFA;VENTOLIN HFA Inhale 2 puffs into the lungs every 6 (six) hours as needed for wheezing or shortness of breath.   gabapentin 300 MG capsule Commonly known as:  NEURONTIN Take 2 capsules every night What changed:    how much to take  how to take this  when to take this  additional instructions   ketorolac 10 MG tablet Commonly known as:  TORADOL Take 1 tablet (10 mg total) by mouth every 6 (six) hours as needed for moderate pain.   lacosamide 200 MG Tabs tablet Commonly known as:  VIMPAT Take 1 tablet (200 mg total) by mouth 2 (two) times daily.       Allergies  Allergen Reactions  . Zonisamide Other (See Comments)    Numbness and tingling over whole body  . Codeine Nausea And Vomiting  . Keppra [Levetiracetam] Other (See Comments)    Causes seizures.   . Pregabalin Nausea And Vomiting  . Olive Oil Rash      The results of significant diagnostics from this hospitalization (including imaging, microbiology, ancillary and laboratory) are listed below for reference.    Significant Diagnostic Studies: Dg Chest 2 View  Result Date: 05/23/2018 CLINICAL DATA:  Central chest pain radiating to left arm. EXAM: CHEST - 2 VIEW COMPARISON:  Body CT 03/24/2018 FINDINGS: Cardiomediastinal silhouette is normal. Mediastinal contours appear intact. Calcific atherosclerotic disease the aorta. There is no evidence of focal airspace consolidation, pleural effusion or pneumothorax. Minimal right middle lobe atelectasis.  Emphysematous changes and hyperinflation of the lungs. Osseous structures are without acute abnormality. Exaggerated thoracic kyphosis. Posttraumatic deformity of the right clavicle. Soft tissues are grossly normal. IMPRESSION: Emphysematous changes and hyperinflation of the lungs. Improved right middle lobe atelectasis. Calcific atherosclerotic disease of the aorta. Electronically Signed   By: Fidela Salisbury M.D.   On: 05/23/2018 17:30   Ct Angio Chest Pe W And/or Wo Contrast  Result Date: 05/23/2018 CLINICAL DATA:  Chest pain. EXAM: CT ANGIOGRAPHY CHEST WITH CONTRAST TECHNIQUE: Multidetector CT imaging of the chest was performed using the standard protocol during bolus administration of intravenous contrast. Multiplanar CT image reconstructions and MIPs were obtained to evaluate the vascular anatomy. CONTRAST:  158mL ISOVUE-370 IOPAMIDOL (ISOVUE-370) INJECTION 76% COMPARISON:  Radiographs of same day.  CT scan of March 24, 2018. FINDINGS: Cardiovascular: Satisfactory opacification of the pulmonary arteries to the segmental level. No  evidence of pulmonary embolism. Normal heart size. No pericardial effusion. Atherosclerosis of thoracic aorta is noted without aneurysm or dissection. Mediastinum/Nodes: No enlarged mediastinal, hilar, or axillary lymph nodes. Thyroid gland, trachea, and esophagus demonstrate no significant findings. Lungs/Pleura: Lungs are clear. No pleural effusion or pneumothorax. Upper Abdomen: No acute abnormality. Musculoskeletal: No chest wall abnormality. No acute or significant osseous findings. Review of the MIP images confirms the above findings. IMPRESSION: No definite evidence of pulmonary embolus. No acute abnormality seen in the chest. Aortic Atherosclerosis (ICD10-I70.0). Electronically Signed   By: Marijo Conception, M.D.   On: 05/23/2018 19:37    Microbiology: No results found for this or any previous visit (from the past 240 hour(s)).   Labs: Basic Metabolic Panel: Recent Labs  Lab 05/23/18 1647  NA 140  K 3.9  CL 109  CO2 23  GLUCOSE 88  BUN 11  CREATININE 0.70  CALCIUM 9.1   Liver Function Tests: Recent Labs  Lab 05/23/18 2254  ALBUMIN 3.6   No results for input(s): LIPASE, AMYLASE in the last 168 hours. No results for input(s): AMMONIA in the last 168 hours. CBC: Recent Labs  Lab 05/23/18 1647  WBC 5.3  HGB 13.7  HCT 41.8  MCV 101.2*  PLT 147*   Cardiac Enzymes: Recent Labs  Lab 05/23/18 2254 05/24/18 0556 05/24/18 0846  TROPONINI <0.03 <0.03 <0.03   BNP: BNP (last 3 results) Recent Labs    05/23/18 2254  BNP 55.6    ProBNP (last 3 results) No results for input(s): PROBNP in the last 8760 hours.  CBG: No results for input(s): GLUCAP in the last 168 hours.     Signed:  Radene Gunning MD.  [facility] 05/24/2018, 3:33 PM

## 2018-05-24 NOTE — Progress Notes (Signed)
Patient's phenytoin level is 26.  Patient scheduled for Dilantin 200mg  at bedtime.  Paged [facility] and told to hold the Dilantin tonight.

## 2018-05-24 NOTE — Progress Notes (Signed)
RN notified of pts headache.

## 2018-05-24 NOTE — Evaluation (Signed)
Physical Therapy Evaluation Patient Details Name: [name] MRN: [id number] DOB: [birth year] Today's Date: 05/24/2018   History of Present Illness  Pt is a 62 y/o female admitted secondary to chest pain; workup in progress. Pt with negative troponins per notes, and CT angio negative for PE. PMH includes seizures, COPD, DVT, CVA, and tobacco abuse.   Clinical Impression  Pt admitted secondary to problem above with deficits below. Pt very unsteady with increased sway noted during transfer to chair. Pt requiring min to mod A to perform stand pivot transfer. Pt feeling "woozy" throughout as well. BP in supine 101/45 mmHg, BP in sitting 103/55 mmHg, and BP after transfer was 101/55 mmHg. Feel pt is at increased risk for falls and will required further therapies to increase independence and safety with mobility. Pt very motivated to regain independence. Will continue to follow acutely to maximize functional mobility independence and safety.     Follow Up Recommendations CIR;Supervision for mobility/OOB    Equipment Recommendations  Rolling walker with 5" wheels    Recommendations for Other Services OT consult     Precautions / Restrictions Precautions Precautions: Fall Restrictions Weight Bearing Restrictions: No      Mobility  Bed Mobility Overal bed mobility: Needs Assistance Bed Mobility: Supine to Sit     Supine to sit: Supervision     General bed mobility comments: Supervision for safety. Reports some wooziness upon sitting. Pt's BP in supine 101/45 mmHg, and pt's BP in sitting 103/55 mmHg.   Transfers Overall transfer level: Needs assistance Equipment used: 1 person hand held assist Transfers: Sit to/from [facility] Sit to Stand: Min assist Stand pivot transfers: Mod assist       General transfer comment: Min A for lift assist and steadying. Pt with increased sway upon standing and very unsteady requiring mod A for steadying during transfer. Checked  BP after transfer and BP at 101/55 mmHg.   Ambulation/Gait             General Gait Details: Unable   Stairs            Wheelchair Mobility    Modified Rankin (Stroke Patients Only)       Balance Overall balance assessment: Needs assistance Sitting-balance support: No upper extremity supported;Feet supported Sitting balance-Leahy Scale: Fair     Standing balance support: Single extremity supported;During functional activity Standing balance-Leahy Scale: Poor Standing balance comment: Reliant on UE and external support.                              Pertinent Vitals/Pain Pain Assessment: Faces Faces Pain Scale: Hurts little more Pain Location: L shoulder  Pain Descriptors / Indicators: Grimacing Pain Intervention(s): Limited activity within patient's tolerance;Monitored during session;Repositioned    Home Living Family/patient expects to be discharged to:: Private residence Living Arrangements: Alone Available Help at Discharge: Family;Available PRN/intermittently Type of Home: House Home Access: Stairs to enter Entrance Stairs-Rails: Left Entrance Stairs-Number of Steps: 2 Home Layout: One level Home Equipment: None Additional Comments: Reports she used to live with her daughter, but has since moved out on her own.     Prior Function Level of Independence: Independent               Hand Dominance   Dominant Hand: Right    Extremity/Trunk Assessment   Upper Extremity Assessment Upper Extremity Assessment: Defer to OT evaluation    Lower Extremity Assessment Lower Extremity Assessment:  Generalized weakness    Cervical / Trunk Assessment Cervical / Trunk Assessment: Normal  Communication   Communication: No difficulties  Cognition Arousal/Alertness: Awake/alert Behavior During Therapy: WFL for tasks assessed/performed Overall Cognitive Status: Within Functional Limits for tasks assessed                                         General Comments General comments (skin integrity, edema, etc.): Pt reports she does not want to go to rehab if she doesn't have to.     Exercises     Assessment/Plan    PT Assessment Patient needs continued PT services  PT Problem List Decreased strength;Decreased balance;Decreased mobility;Decreased coordination;Decreased activity tolerance;Decreased knowledge of use of DME;Decreased knowledge of precautions       PT Treatment Interventions DME instruction;Gait training;Stair training;Functional mobility training;Therapeutic activities;Balance training;Therapeutic exercise;Patient/family education    PT Goals (Current goals can be found in the Care Plan section)  Acute Rehab PT Goals Patient Stated Goal: to get back to being normal PT Goal Formulation: With patient Time For Goal Achievement: 06/07/18 Potential to Achieve Goals: Good    Frequency Min 3X/week   Barriers to discharge        Co-evaluation               AM-PAC PT "6 Clicks" Daily Activity  Outcome Measure Difficulty turning over in bed (including adjusting bedclothes, sheets and blankets)?: A Little Difficulty moving from lying on back to sitting on the side of the bed? : A Little Difficulty sitting down on and standing up from a chair with arms (e.g., wheelchair, bedside commode, etc,.)?: Unable Help needed moving to and from a bed to chair (including a wheelchair)?: A Lot Help needed walking in hospital room?: A Lot Help needed climbing 3-5 steps with a railing? : Total 6 Click Score: 12    End of Session Equipment Utilized During Treatment: Gait belt Activity Tolerance: Treatment limited secondary to medical complications (Comment)("wooziness" ) Patient left: in chair;with call bell/phone within reach;with chair alarm set Nurse Communication: Mobility status PT Visit Diagnosis: Other abnormalities of gait and mobility (R26.89);Unsteadiness on feet (R26.81);Muscle weakness  (generalized) (M62.81)    Time: 1700-1749 PT Time Calculation (min) (ACUTE ONLY): 20 min   Charges:   PT Evaluation $PT Eval Moderate Complexity: [place], PT, DPT  Acute Rehabilitation Services  Pager: [phone] Office: [phone]   Rudean Hitt 05/24/2018, 3:38 PM

## 2018-05-24 NOTE — Progress Notes (Deleted)
CM talked to patient about a rolling walker for home, pt refused stated that she does not need one. [name] [facility] [phone]

## 2018-05-24 NOTE — Consult Note (Signed)
   [facility] Inpatient Consult   05/24/2018  [name] Oct 02, 1955 [id number]  Patient was assessed for [place] [facility] for community services. Patient was previously active with [place] [facility].  Met with patient at bedside regarding being restarted with [facility] services. Patient with Seizure noted.  Patient states she still have issues with getting transportation to her primary care provider. Patient unaware of [name].  Primary Care Provider:  Dr. [name]  Reviewed signed consent on file and patient verified information.  Patient's primary care provider provides the transition of care follow up.   Of note, [facility] Care Management services does not replace or interfere with any services that are arranged by inpatient case management or social work. For additional questions or referrals please contact:  Natividad Brood, RN BSN [facility] Liaison  [phone] business mobile phone Toll free office [phone]

## 2018-05-25 ENCOUNTER — Other Ambulatory Visit: Payer: Self-pay

## 2018-05-25 ENCOUNTER — Encounter (HOSPITAL_COMMUNITY): Payer: Self-pay | Admitting: Internal Medicine

## 2018-05-25 DIAGNOSIS — Z8673 Personal history of transient ischemic attack (TIA), and cerebral infarction without residual deficits: Secondary | ICD-10-CM

## 2018-05-25 DIAGNOSIS — R531 Weakness: Secondary | ICD-10-CM

## 2018-05-25 DIAGNOSIS — M549 Dorsalgia, unspecified: Secondary | ICD-10-CM

## 2018-05-25 DIAGNOSIS — R569 Unspecified convulsions: Secondary | ICD-10-CM

## 2018-05-25 DIAGNOSIS — J441 Chronic obstructive pulmonary disease with (acute) exacerbation: Secondary | ICD-10-CM

## 2018-05-25 DIAGNOSIS — Z720 Tobacco use: Secondary | ICD-10-CM

## 2018-05-25 DIAGNOSIS — G8929 Other chronic pain: Secondary | ICD-10-CM | POA: Diagnosis present

## 2018-05-25 DIAGNOSIS — D696 Thrombocytopenia, unspecified: Secondary | ICD-10-CM | POA: Diagnosis not present

## 2018-05-25 DIAGNOSIS — R5381 Other malaise: Secondary | ICD-10-CM

## 2018-05-25 DIAGNOSIS — G629 Polyneuropathy, unspecified: Secondary | ICD-10-CM

## 2018-05-25 DIAGNOSIS — R079 Chest pain, unspecified: Secondary | ICD-10-CM

## 2018-05-25 DIAGNOSIS — R52 Pain, unspecified: Secondary | ICD-10-CM

## 2018-05-25 LAB — HEMOGLOBIN A1C
Hgb A1c MFr Bld: 5.2 % (ref 4.8–5.6)
Mean Plasma Glucose: 103 mg/dL

## 2018-05-25 MED ORDER — GABAPENTIN 300 MG PO CAPS
300.0000 mg | ORAL_CAPSULE | Freq: Two times a day (BID) | ORAL | Status: DC
  Administered 2018-05-25 – 2018-05-26 (×3): 300 mg via ORAL
  Filled 2018-05-25 (×3): qty 1

## 2018-05-25 NOTE — Progress Notes (Signed)
Inpatient Rehabilitation-Admissions Coordinator    Met with patient at the bedside as follow up from PM&R consult.Discussed program and asked pt how she thought she was doing with her mobility. Pt reports she will need therapy prior to returning home and wants intensive rehab at this time. AC shared booklets, expectations while in CIR, expected length of stay, and anticipated functional level at DC.  AC will begin insurance authorization process and notify pt of current insurance benefits.   Please call if questions.   Jhonnie Garner, OTR/L  Rehab Admissions Coordinator  [phone] 05/25/2018 3:15 PM

## 2018-05-25 NOTE — Progress Notes (Signed)
Notified by RN that patient complained of worsening bilateral neuropathic leg pain.   Patient had informed me of same and I had  increased her home dose of neuronitn.  [name] Glad m. Zariyah Stephens, NP

## 2018-05-25 NOTE — Clinical Social Work Note (Signed)
Clinical Social Work Assessment  Patient Details  Name: [name] MRN: [id number] Date of Birth: 02-14-1956  Date of referral:  05/25/18               Reason for consult:  Facility Placement, Discharge Planning                Permission sought to share information with:  Chartered certified accountant granted to share information::  Yes, Verbal Permission Granted  Name::        Agency::  SNF's  Relationship::     Contact Information:     Housing/Transportation Living arrangements for the past 2 months:  Single Family Home Source of Information:  Patient, Medical Team Patient Interpreter Needed:  None Criminal Activity/Legal Involvement Pertinent to Current Situation/Hospitalization:  No - Comment as needed Significant Relationships:  Adult Children, Siblings Lives with:  Self Do you feel safe going back to the place where you live?  Yes Need for family participation in patient care:  Yes (Comment)  Care giving concerns:  PT recommending CIR. CSW initiating SNF backup plan.   Social Worker assessment / plan:  CSW met with patient. No supports at bedside. CSW introduced role and explained that PT recommendations would be discussed. Discussed potential barriers to CIR. Patient is agreeable to SNF if she cannot go to CIR. Patient went to [facility] years ago but does not want to return there. Patient moved out of her daughter's home three weeks ago and now lives alone. Patient asking CSW multiple medical questions. Advised that she ask her RN or MD. She did ask if we have checked for blood clots in her legs because she has a history of blood clots. RN has been notified. No further concerns. CSW encouraged patient to contact CSW as needed. CSW will continue to follow patient for support and facilitate discharge to SNF, if needed, once medically stable.  Employment status:  Disabled (Comment on whether or not currently receiving Disability) Insurance information:  Managed  Medicare PT Recommendations:  Inpatient Rehab Consult Information / Referral to community resources:  [place]  Patient/Family's Response to care:  Patient agreeable to SNF if she cannot go to CIR. Patient's family supportive and involved in patient's care. Patient appreciated social work intervention.  Patient/Family's Understanding of and Emotional Response to Diagnosis, Current Treatment, and Prognosis:  Patient has a good understanding of the reason for admission and her need for rehab prior to returning home. Patient appears happy with hospital care.  Emotional Assessment Appearance:  Appears stated age Attitude/Demeanor/Rapport:  Engaged, Gracious Affect (typically observed):  Accepting, Appropriate, Calm, Pleasant Orientation:  Oriented to Self, Oriented to Place, Oriented to  Time, Oriented to Situation Alcohol / Substance use:  Tobacco Use Psych involvement (Current and /or in the community):  No (Comment)  Discharge Needs  Concerns to be addressed:  Care Coordination Readmission within the last 30 days:  No Current discharge risk:  Dependent with Mobility, Lives alone Barriers to Discharge:  Continued Medical Work up, [place], LCSW 05/25/2018, 12:00 PM

## 2018-05-25 NOTE — NC FL2 (Signed)
[place] MEDICAID FL2 LEVEL OF CARE SCREENING TOOL     IDENTIFICATION  Patient Name: [name] Birthdate: 02/14/1956 Sex: female Admission Date (Current Location): 05/23/2018  [facility] and [place] Number:  Herbalist and Address:  [facility] [place] [address] [place], [place] [address]      Provider Number: [id number]  Attending Physician Name and Address:  Lady Deutscher, MD  Relative Name and Phone Number:       Current Level of Care: Hospital Recommended Level of Care: [place] Prior Approval Number:    Date Approved/Denied:   PASRR Number: [id number]  Discharge Plan: SNF    Current Diagnoses: Patient Active Problem List   Diagnosis Date Noted  . History of CVA (cerebrovascular accident)   . Pain   . Orthostasis   . Tobacco abuse   . Left-sided weakness 03/19/2018  . Atypical chest pain 02/17/2017  . Localized swelling of lower extremity 02/17/2017  . GERD (gastroesophageal reflux disease) 02/17/2017  . Costochondritis 02/17/2017  . Seizures ([place]) 11/02/2015  . Gait disturbance 11/02/2015  . Right clavicle fracture 11/02/2015  . Infection of urinary tract 11/02/2015  . Chest pain 03/31/2015  . Acute on chronic respiratory failure with hypoxia ([place]) 03/31/2015  . SOB (shortness of breath) 03/31/2015  . Anxiety state 03/31/2015  . Malnutrition of moderate degree ([place]) 03/31/2015  . COPD with exacerbation ([place]) 03/30/2015  . Hypoxia 01/18/2015  . Chronic obstructive pulmonary disease with acute exacerbation ([place])   . Fever 01/17/2015  . Cough 01/17/2015  . Sepsis ([place]) 01/17/2015  . COPD (chronic obstructive pulmonary disease) ([place])   . Unintentional weight loss 01/11/2015  . Asthma 05/15/2014  . Seizure ([place]) 05/15/2014  . Protein calorie malnutrition ([place]) 05/15/2014  . DVT of leg (deep venous thrombosis) ([place]) 06/19/2013  . DDD (degenerative disc disease) 06/12/2013  . Paresthesias  06/12/2013  . Tremor 06/09/2013  . Thrombocytopenia- chronic 06/29/2012  . Noncompliance with medication treatment due to underuse of medication 06/29/2012  . Stress-personal 06/29/2012  . Recurrent seizures ([place]) 06/29/2012  . Tobacco dependence 06/28/2012  . Skull fracture ([place]) 02/28/2012  . Dilantin toxicity 02/28/2012  . Seizure disorder ([place]) 02/28/2012  . History of Intracranial bleed 02/28/2012    Orientation RESPIRATION BLADDER Height & Weight     Self, Time, Situation, Place  Normal Continent Weight: 124 lb 12.5 oz (56.6 kg) Height:  5\' 7"  (170.2 cm)  BEHAVIORAL SYMPTOMS/MOOD NEUROLOGICAL BOWEL NUTRITION STATUS  (None) Convulsions/Seizures Continent Diet(Regular)  AMBULATORY STATUS COMMUNICATION OF NEEDS Skin   Extensive Assist Verbally Normal                       Personal Care Assistance Level of Assistance  Bathing, Feeding, Dressing Bathing Assistance: Limited assistance Feeding assistance: Independent Dressing Assistance: Limited assistance     Functional Limitations Info  Sight, Hearing, Speech Sight Info: Adequate Hearing Info: Adequate Speech Info: Adequate    SPECIAL CARE FACTORS FREQUENCY  PT (By licensed PT), OT (By licensed OT)     PT Frequency: 5 x week OT Frequency: 5 x week            Contractures Contractures Info: Not present    Additional Factors Info  Code Status, Allergies Code Status Info: Full code Allergies Info: Zonisamide, Codeine, Keppra (Levetiracetam), Pregabalin, Olive Oil           Current Medications (05/25/2018):  This is the current hospital active medication list  Current Facility-Administered Medications  Medication Dose Route Frequency Provider Last Rate Last Dose  . acetaminophen (TYLENOL) tablet 650 mg  650 mg Oral Q4H PRN Ivor Costa, MD      . ALPRAZolam Duanne Moron) tablet 0.25 mg  0.25 mg Oral BID PRN Ivor Costa, MD      . dextromethorphan-guaiFENesin [facility] DM) 30-600 MG per 12 hr tablet 1 tablet  1  tablet Oral BID PRN Ivor Costa, MD      . enoxaparin (LOVENOX) injection 40 mg  40 mg Subcutaneous Q24H Ivor Costa, MD   40 mg at 05/25/18 0815  . gabapentin (NEURONTIN) capsule 300 mg  300 mg Oral BID Radene Gunning, NP   300 mg at 05/25/18 0919  . ipratropium-albuterol (DUONEB) 0.5-2.5 (3) MG/3ML nebulizer solution 3 mL  3 mL Nebulization TID Ivor Costa, MD   3 mL at 05/25/18 0805  . ketorolac (TORADOL) tablet 10 mg  10 mg Oral Q6H PRN Radene Gunning, NP   10 mg at 05/25/18 0814  . lacosamide (VIMPAT) tablet 200 mg  200 mg Oral BID Ivor Costa, MD   200 mg at 05/25/18 0814  . LORazepam (ATIVAN) injection 1 mg  1 mg Intravenous Q2H PRN Ivor Costa, MD      . morphine 2 MG/ML injection 2 mg  2 mg Intravenous Q4H PRN Ivor Costa, MD   2 mg at 05/25/18 1155  . nitroGLYCERIN (NITROSTAT) SL tablet 0.4 mg  0.4 mg Sublingual Q5 min PRN Ivor Costa, MD   0.4 mg at 05/23/18 1930  . ondansetron (ZOFRAN) injection 4 mg  4 mg Intravenous Q6H PRN Ivor Costa, MD      . pantoprazole (PROTONIX) EC tablet 40 mg  40 mg Oral Daily PRN Ivor Costa, MD      . zolpidem (AMBIEN) tablet 5 mg  5 mg Oral QHS PRN Ivor Costa, MD         Discharge Medications: Please see discharge summary for a list of discharge medications.  Relevant Imaging Results:  Relevant Lab Results:   Additional Information SS#: [id number]  [name], LCSW

## 2018-05-25 NOTE — Consult Note (Signed)
Physical Medicine and Rehabilitation Consult Reason for Consult: Decreased functional mobility Referring Physician: [facility]  HPI: [name] is a 62 y.o. right-handed female with history of COPD and quit smoking 6 months ago, [place], DVT not on anticoagulants, seizure disorder.  Per chart review and patient, patient lives alone independent prior to admission.  One level home 2 steps to entry.  She has a daughter who checks on her daily.  Her daughter has an office within her mother's home that she works out of and can assist as needed.    Presented 05/23/2018 with shortness of breath and chest pain.  Troponin negative.  Urine drug screen negative.  CT angiogram of the chest showed no definite evidence of pulmonary embolus.  No acute abnormality seen.  CT head reviewed, unremarkable for acute intracranial process.  Suspect mild COPD exacerbation and placed on prednisone therapy.  Dilantin level elevated at 26 and dosage of Dilantin adjusted.  Therapy evaluation completed with recommendations of physical medicine rehab consult.  Review of Systems  Constitutional: Negative for chills and fever.  HENT: Negative for hearing loss.   Eyes: Negative for blurred vision and double vision.  Respiratory: Positive for cough and shortness of breath.   Gastrointestinal: Positive for constipation. Negative for nausea and vomiting.       GERD  Genitourinary: Negative for dysuria, flank pain and hematuria.  Musculoskeletal: Positive for myalgias.  Skin: Negative for rash.  Neurological: Positive for weakness and headaches.  All other systems reviewed and are negative.  Past Medical History:  Diagnosis Date  . Asthma   . Cerebral hemorrhage ([place]) 1989  . Chronic back pain   . COPD (chronic obstructive pulmonary disease) ([place])   . DDD (degenerative disc disease) 06/12/2013  . DVT (deep venous thrombosis) ([place])    "she's had several since 1990"  . GERD (gastroesophageal reflux disease)   .  Headache    "usually around time when she's had a seizure"  . History of blood transfusion    "when she was a baby"  . History of stomach ulcers   . Kidney stones   . Seizures ([place])    "because of her brain surgery"  . Stroke [facility]) [phone]   family denies residual on 11/09/2014  . Tunnel vision    "since brain OR"   Past Surgical History:  Procedure Laterality Date  . [place]   craniotomy with hematoma evacuation  . CESAREAN SECTION  1979; 1987; 1989  . HEMORRHOID SURGERY    . TUBAL LIGATION  1990's   Family History  Problem Relation Age of Onset  . Heart attack Mother        4 MIs, started in her 56s, also vaginal cancer and colon cancer  . Cancer Mother   . Heart attack Father        Died suddenly age 4 - autopsy revealed heart attack her patient  . Cancer Brother        Sinus cancer   Social History:  reports that she quit smoking about 6 months ago. Her smoking use included cigarettes. She has a 10.00 pack-year smoking history. She has never used smokeless tobacco. She reports that she does not drink alcohol or use drugs. Allergies:  Allergies  Allergen Reactions  . Zonisamide Other (See Comments)    Numbness and tingling over whole body  . Codeine Nausea And Vomiting  . Keppra [Levetiracetam] Other (See Comments)    Causes seizures.   Marland Kitchen  Pregabalin Nausea And Vomiting  . Olive Oil Rash   Medications Prior to Admission  Medication Sig Dispense Refill  . acetaminophen (TYLENOL) 325 MG tablet Take 325 mg by mouth daily as needed for headache (pain).     Marland Kitchen albuterol (PROVENTIL HFA;VENTOLIN HFA) 108 (90 Base) MCG/ACT inhaler Inhale 2 puffs into the lungs every 6 (six) hours as needed for wheezing or shortness of breath.    . gabapentin (NEURONTIN) 300 MG capsule Take 2 capsules every night (Patient taking differently: Take 300 mg by mouth at bedtime. ) 60 capsule 6  . lacosamide (VIMPAT) 200 MG TABS tablet Take 1 tablet (200 mg total) by mouth 2 (two) times  daily. 180 tablet 3  . phenytoin (DILANTIN) 100 MG ER capsule Take 1 capsule (100 mg total) by mouth daily. (Patient taking differently: Take 100-200 mg by mouth See admin instructions. Take one capsule (100 mg) by mouth every morning with two 30 mg capsules for a total morning dose of 160 mg, take two capsules (200 mg) by mouth every night.) 30 capsule 0  . phenytoin (DILANTIN) 30 MG ER capsule Take 60 mg by mouth See admin instructions. Take two capsules (60 mg) by mouth every morning with a 100 mg capsule for a total morning dose of 160 mg    . phenytoin (DILANTIN) 200 MG ER capsule Take 1 capsule (200 mg total) by mouth at bedtime. (Patient not taking: Reported on 05/23/2018) 60 capsule 0  . vitamin B-12 (CYANOCOBALAMIN) 1000 MCG tablet Take 1 tablet (1,000 mcg total) by mouth daily. (Patient not taking: Reported on 05/23/2018) 30 tablet 0    Home: Home Living Family/patient expects to be discharged to:: Private residence Living Arrangements: Alone Available Help at Discharge: Family, Available PRN/intermittently Type of Home: House Home Access: Stairs to enter [employer] of Steps: 2 Entrance Stairs-Rails: Left Home Layout: One level Bathroom Shower/Tub: Tub/shower unit, Multimedia programmer: Standard Home Equipment: None Additional Comments: Reports she used to live with her daughter, but has since moved out on her own.   Functional History: Prior Function Level of Independence: Independent Functional Status:  Mobility: Bed Mobility Overal bed mobility: Needs Assistance Bed Mobility: Supine to Sit Supine to sit: Supervision General bed mobility comments: Supervision for safety. Reports some wooziness upon sitting. Pt's BP in supine 101/45 mmHg, and pt's BP in sitting 103/55 mmHg.  Transfers Overall transfer level: Needs assistance Equipment used: 1 person hand held assist Transfers: Sit to/from Stand, Stand Pivot Transfers Sit to Stand: Min assist Stand  pivot transfers: Mod assist General transfer comment: Min A for lift assist and steadying. Pt with increased sway upon standing and very unsteady requiring mod A for steadying during transfer. Checked BP after transfer and BP at 101/55 mmHg.  Ambulation/Gait General Gait Details: Unable     ADL:    Cognition: Cognition Overall Cognitive Status: Within Functional Limits for tasks assessed Orientation Level: Oriented X4 Cognition Arousal/Alertness: Awake/alert Behavior During Therapy: WFL for tasks assessed/performed Overall Cognitive Status: Within Functional Limits for tasks assessed  Blood pressure (!) 104/57, pulse 72, temperature 98 F (36.7 C), temperature source Oral, resp. rate 18, height 5\' 7"  (1.702 m), weight 56.6 kg, SpO2 98 %. Physical Exam  Constitutional: She is oriented to person, place, and time. She appears well-developed and well-nourished.  HENT:  Head: Normocephalic and atraumatic.  Eyes: EOM are normal. Right eye exhibits no discharge. Left eye exhibits no discharge.  Neck: Normal range of motion. Neck supple. No thyromegaly  present.  Cardiovascular: Normal rate and regular rhythm.  Respiratory: Effort normal and breath sounds normal. No respiratory distress.  GI: Soft. Bowel sounds are normal. She exhibits no distension.  Musculoskeletal:  No edema or tenderness in extremities  Neurological: She is alert and oriented to person, place, and time.  Motor: Bilateral upper extremities: 5/5 proximal distal Bilateral lower extremities: Flexion, knee extension, ankle dorsiflexion 4-/5 Sensation diminished to light touch distal to bilateral knees  Skin: Skin is warm and dry.  Psychiatric: She has a normal mood and affect. Her behavior is normal.    Results for orders placed or performed during the hospital encounter of 05/23/18 (from the past 24 hour(s))  Troponin I (q 6hr x 3)     Status: None   Collection Time: 05/24/18  8:46 AM  Result Value Ref Range    Troponin I <0.03 <0.03 ng/mL   Dg Chest 2 View  Result Date: 05/23/2018 CLINICAL DATA:  Central chest pain radiating to left arm. EXAM: CHEST - 2 VIEW COMPARISON:  Body CT 03/24/2018 FINDINGS: Cardiomediastinal silhouette is normal. Mediastinal contours appear intact. Calcific atherosclerotic disease the aorta. There is no evidence of focal airspace consolidation, pleural effusion or pneumothorax. Minimal right middle lobe atelectasis. Emphysematous changes and hyperinflation of the lungs. Osseous structures are without acute abnormality. Exaggerated thoracic kyphosis. Posttraumatic deformity of the right clavicle. Soft tissues are grossly normal. IMPRESSION: Emphysematous changes and hyperinflation of the lungs. Improved right middle lobe atelectasis. Calcific atherosclerotic disease of the aorta. Electronically Signed   By: Fidela Salisbury M.D.   On: 05/23/2018 17:30   Ct Head Wo Contrast  Result Date: 05/24/2018 CLINICAL DATA:  Ataxia. EXAM: CT HEAD WITHOUT CONTRAST TECHNIQUE: Contiguous axial images were obtained from the base of the skull through the vertex without intravenous contrast. COMPARISON:  CT 03/19/2018 FINDINGS: Brain: Large area of encephalomalacia in the posterior right cerebral hemisphere, stable. No acute intracranial abnormality. Specifically, no hemorrhage, hydrocephalus, mass lesion, acute infarction, or significant intracranial injury. Vascular: No hyperdense vessel or unexpected calcification. Skull: No acute calvarial abnormality. Right parietal craniotomy, stable. Sinuses/Orbits: Visualized paranasal sinuses and mastoids clear. Orbital soft tissues unremarkable. Other: None IMPRESSION: Large area of encephalomalacia in the posterior right cerebral hemisphere. No acute intracranial abnormality. Electronically Signed   By: Rolm Baptise M.D.   On: 05/24/2018 22:25   Ct Angio Chest Pe W And/or Wo Contrast  Result Date: 05/23/2018 CLINICAL DATA:  Chest pain. EXAM: CT  ANGIOGRAPHY CHEST WITH CONTRAST TECHNIQUE: Multidetector CT imaging of the chest was performed using the standard protocol during bolus administration of intravenous contrast. Multiplanar CT image reconstructions and MIPs were obtained to evaluate the vascular anatomy. CONTRAST:  144mL ISOVUE-370 IOPAMIDOL (ISOVUE-370) INJECTION 76% COMPARISON:  Radiographs of same day.  CT scan of March 24, 2018. FINDINGS: Cardiovascular: Satisfactory opacification of the pulmonary arteries to the segmental level. No evidence of pulmonary embolism. Normal heart size. No pericardial effusion. Atherosclerosis of thoracic aorta is noted without aneurysm or dissection. Mediastinum/Nodes: No enlarged mediastinal, hilar, or axillary lymph nodes. Thyroid gland, trachea, and esophagus demonstrate no significant findings. Lungs/Pleura: Lungs are clear. No pleural effusion or pneumothorax. Upper Abdomen: No acute abnormality. Musculoskeletal: No chest wall abnormality. No acute or significant osseous findings. Review of the MIP images confirms the above findings. IMPRESSION: No definite evidence of pulmonary embolus. No acute abnormality seen in the chest. Aortic Atherosclerosis (ICD10-I70.0). Electronically Signed   By: Marijo Conception, M.D.   On: 05/23/2018 19:37    Assessment/Plan:  Diagnosis: COPD exacerbation Labs independently reviewed.  Records reviewed and summated above.  1. Does the need for close, 24 hr/day medical supervision in concert with the patient's rehab needs make it unreasonable for this patient to be served in a less intensive setting? Potentially  2. Co-Morbidities requiring supervision/potential complications:  COPD (monitor respiratory rate and O2 sats with increased physical exertion) history of [place], DVT not on anticoagulants, seizure disorder (continue meds, adjust Dilantin dose), pain (Biofeedback training with therapies to help reduce reliance on opiate and IV pain medications, particularly Toradol,  monitor pain control during therapies, and sedation at rest and titrate to maximum efficacy to ensure participation and gains in therapies), thrombocytopenia 3. Due to safety, disease management and patient education, does the patient require 24 hr/day rehab nursing? Potentially 4. Does the patient require coordinated care of a physician, rehab nurse, PT (1-2 hrs/day, 5 days/week) and OT (1-2 hrs/day, 5 days/week) to address physical and functional deficits in the context of the above medical diagnosis(es)? Potentially Addressing deficits in the following areas: balance, endurance, locomotion, strength, transferring, bathing, dressing, toileting and psychosocial support 5. Can the patient actively participate in an intensive therapy program of at least 3 hrs of therapy per day at least 5 days per week? Yes 6. The potential for patient to make measurable gains while on inpatient rehab is excellent 7. Anticipated functional outcomes upon discharge from inpatient rehab are supervision  with PT, supervision with OT, n/a with SLP. 8. Estimated rehab length of stay to reach the above functional goals is: 9-14 days. 9. Anticipated D/C setting: Home 10. Anticipated post D/C treatments: HH therapy and Home excercise program 11. Overall Rehab/Functional Prognosis: good  RECOMMENDATIONS: This patient's condition is appropriate for continued rehabilitative care in the following setting: Will consider CIR.  Will follow with therapies and if strength does not improve consider decreasing steroids. Patient has agreed to participate in recommended program. Yes Note that insurance prior authorization may be required for reimbursement for recommended care.  Comment: Rehab Admissions Coordinator to follow up.   I have personally performed a face to face diagnostic evaluation, including, but not limited to relevant history and physical exam findings, of this patient and developed relevant assessment and plan.   Additionally, I have reviewed and concur with the physician assistant's documentation above.   Delice Lesch, MD, ABPMR Lavon Paganini Angiulli, PA-C 05/25/2018

## 2018-05-25 NOTE — Evaluation (Signed)
Occupational Therapy Evaluation Patient Details Name: [name] MRN: [id number] DOB: [birth year] Today's Date: 05/25/2018    History of Present Illness Pt is a 62 y/o female admitted secondary to chest pain; workup in progress. Pt with negative troponins per notes, and CT angio negative for PE. PMH includes seizures, COPD, DVT, CVA, LE neuropathy and tobacco abuse.    Clinical Impression   Pt admitted secondary to problem above with deficits below. Pt very unsteady with increased sway noted during transfers. Pt requiring min to mod A to perform stand pivot transfer. Pt is at risk for falls and will required further, more frequent therapy to increase independence and safety with mobility during ADL's. Pt lives alone. Pt is motivated to regain independence. Will continue to follow acutely for OT to assist in maximizing safety & functional independence during ADL's.     Follow Up Recommendations  CIR;Supervision/Assistance - 24 hour    Equipment Recommendations  Other (comment)(Defer to next venue)    Recommendations for Other Services Rehab consult     Precautions / Restrictions Precautions Precautions: Fall Precaution Comments: Pt with h/o neuropathy and bilateral LE pain Restrictions Weight Bearing Restrictions: No      Mobility Bed Mobility Overal bed mobility: Needs Assistance Bed Mobility: Supine to Sit     Supine to sit: Supervision     General bed mobility comments: Supervision for safety. Reports some initial dizziness upon sitting  Transfers Overall transfer level: Needs assistance Equipment used: Rolling walker (2 wheeled)(Attempted 1 hand held assist, pt unsteady.) Transfers: Sit to/from [facility] Sit to Stand: Min assist Stand pivot transfers: Mod assist            Balance Overall balance assessment: Needs assistance Sitting-balance support: No upper extremity supported;Feet supported Sitting balance-Leahy Scale: Fair      Standing balance support: Bilateral upper extremity supported;During functional activity Standing balance-Leahy Scale: Poor Standing balance comment: Heavily reliant on RW/external support.                            ADL either performed or assessed with clinical judgement   ADL Overall ADL's : Needs assistance/impaired Eating/Feeding: Set up;Bed level   Grooming: Wash/dry hands;Sitting;Set up Grooming Details (indicate cue type and reason): Pt would be +2 for safety for standing at sink secondary to LE pain and overall weakenss Upper Body Bathing: Sitting;Min guard   Lower Body Bathing: Moderate assistance;Sit to/from stand;+2 for safety/equipment Lower Body Bathing Details (indicate cue type and reason): Pt with c/o LE weakness and LE's buckling in standing. Heavility reliant on RW for support, rec +2 for safety Upper Body Dressing : Sitting;Min guard   Lower Body Dressing: Moderate assistance;+2 for safety/equipment;+2 for physical assistance;Sit to/from stand   Toilet Transfer: Moderate assistance;+2 for physical assistance;+2 for safety/equipment;Ambulation;RW;BSC   Toileting- Clothing Manipulation and Hygiene: Moderate assistance;+2 for safety/equipment;+2 for physical assistance;Sit to/from stand       Functional mobility during ADLs: Minimal assistance;Moderate assistance;+2 for physical assistance;+2 for safety/equipment;Rolling walker General ADL Comments: Pt seen for OT assessment followed by participation in brief ADL retraining session to include bed mobility, standing activity prior to functional mobility. Mod A for fucntional mob w/ +2 for safety during toilet transfer using RW. She benefits from +2 for safety with mobility and transfers. DIscussed OT recommendations and CIR consult, pt was agreeable to both secondary to current LE pain and lives alone. Will follow for acute OT     Vision  Baseline Vision/History: Wears glasses Wears Glasses: Reading  only Patient Visual Report: No change from baseline;Peripheral vision impairment(Pt with impaired/absent peripheral vision bilateral eyes. Relies on central vision. No changes from baseline)       Perception     Praxis      Pertinent Vitals/Pain Pain Assessment: 0-10 Pain Score: 10-Worst pain ever Pain Location: Bilateral LE's - neuropathy Pain Descriptors / Indicators: Aching;Numbness;Nagging;Discomfort Pain Intervention(s): Limited activity within patient's tolerance;Monitored during session;Repositioned;Patient requesting pain meds-RN notified     Hand Dominance Right   Extremity/Trunk Assessment Upper Extremity Assessment Upper Extremity Assessment: Generalized weakness(Grossly -3-3/5 bilaterally)   Lower Extremity Assessment Lower Extremity Assessment: Defer to PT evaluation;Generalized weakness   Cervical / Trunk Assessment Cervical / Trunk Assessment: Normal   Communication Communication Communication: No difficulties   Cognition Arousal/Alertness: Awake/alert Behavior During Therapy: WFL for tasks assessed/performed Overall Cognitive Status: Within Functional Limits for tasks assessed                                     General Comments  Discussed CIR for therapy/Rehab and pt was agreeable to this    Exercises     Shoulder Instructions      Home Living Family/patient expects to be discharged to:: Private residence Living Arrangements: Alone Available Help at Discharge: Family;Available PRN/intermittently Type of Home: House Home Access: Stairs to enter [employer] of Steps: 2 Entrance Stairs-Rails: Left Home Layout: One level     Bathroom Shower/Tub: Tub/shower unit;Walk-in shower(Uses walk in shower)   Bathroom Toilet: Standard     Home Equipment: None   Additional Comments: Reports she used to live with her daughter, but has since moved out on her own just over 3 weeks ago..       Prior Functioning/Environment Level  of Independence: Independent                 OT Problem List: Decreased strength;Decreased activity tolerance;Decreased knowledge of use of DME or AE;Pain;Impaired sensation;Impaired balance (sitting and/or standing)      OT Treatment/Interventions: Self-care/ADL training;DME and/or AE instruction;Therapeutic activities;Therapeutic exercise;Energy conservation;Patient/family education    OT Goals(Current goals can be found in the care plan section) Acute Rehab OT Goals Patient Stated Goal: Get back to normal, I wasn't like this a few weeks ago OT Goal Formulation: With patient Time For Goal Achievement: 06/08/18 Potential to Achieve Goals: Good  OT Frequency: Min 2X/week   Barriers to D/C: Other (comment)  Lives alone       Co-evaluation              AM-PAC PT "6 Clicks" Daily Activity     Outcome Measure Help from another person eating meals?: A Little Help from another person taking care of personal grooming?: A Lot(A little if sitting; A lot in standing) Help from another person toileting, which includes using toliet, bedpan, or urinal?: A Lot Help from another person bathing (including washing, rinsing, drying)?: A Lot Help from another person to put on and taking off regular upper body clothing?: A Little Help from another person to put on and taking off regular lower body clothing?: A Lot 6 Click Score: 14   End of Session Equipment Utilized During Treatment: Gait belt;Rolling walker Nurse Communication: Mobility status;Patient requests pain meds;Other (comment)(Pain rating 10/10)  Activity Tolerance: Patient tolerated treatment well;Patient limited by pain Patient left: in chair;with call bell/phone within reach;with chair alarm set  OT  Visit Diagnosis: Unsteadiness on feet (R26.81);Muscle weakness (generalized) (M62.81);Pain Pain - Right/Left: (Bilateral LE's) Pain - part of body: Leg(Bialteral LE's)                Time: 4039-7953 OT Time Calculation  (min): 30 min Charges:  OT General Charges $OT Visit: 1 Visit OT Evaluation $OT Eval Moderate Complexity: 1 Mod OT Treatments $Self Care/Home Management : 8-22 mins  Rori Goar Beth Dixon, OTR/L 05/25/2018, 10:11 AM

## 2018-05-25 NOTE — Progress Notes (Addendum)
Physical Therapy Treatment Patient Details Name: [name] MRN: [id number] DOB: 04-Apr-1956 Today's Date: 05/25/2018    History of Present Illness Pt is a 62 y.o. female admitted 05/23/18 secondary to chest pain. Worked up for pleuritic chest pain; may be related to mild COPD exacerbation. CT angiogram negative for PE. PMH includes seizures, COPD, DVT, CVA, LE neuropathy, tobacco abuse.    PT Comments    Pt slowly progressing with mobility. Requires consistent min-modA to prevent LOB while ambulating with RW; pt with unsteady, scissoring gait, limited by c/o BLE numbness (L>R). Pt with decreased balance strategies/postural reactions; at significant increased risk for falls. Continue to recommend intensive CIR-level therapies; pt motivated to return to indep, active PLOF.  Pt c/o of worsening BLE numbness/pain (not relieved by meds). Also notes increased size of lower leg veins. Contacted Dyanne Carrel, NP with my concerns; her response "I am aware and have addressed."   Follow Up Recommendations  CIR;Supervision for mobility/OOB     Equipment Recommendations  Rolling walker with 5" wheels    Recommendations for Other Services       Precautions / Restrictions Precautions Precautions: Fall Precaution Comments: Pt with h/o neuropathy and bilateral LE pain Restrictions Weight Bearing Restrictions: No    Mobility  Bed Mobility Overal bed mobility: Needs Assistance Bed Mobility: Sit to Supine       Sit to supine: Supervision   General bed mobility comments: Received sitting in recliner  Transfers Overall transfer level: Needs assistance Equipment used: Rolling walker (2 wheeled) Transfers: Sit to/from Stand Sit to Stand: Min assist         General transfer comment: MinA to prevent lateral and posterior LOB upon standing  Ambulation/Gait Ambulation/Gait assistance: Min assist Gait Distance (Feet): 20 Feet Assistive device: Rolling walker (2 wheeled) Gait  Pattern/deviations: Step-through pattern;Decreased stride length;Staggering right;Staggering left;Narrow base of support;Scissoring Gait velocity: Decreased Gait velocity interpretation: <1.31 ft/sec, indicative of household ambulator General Gait Details: Consistent min-modA to prevent LOB with ambulation; pt repeatedly c/o BLE numbness, very unsteady amb with BLEs scissoring and intermittent knee buckling. 1x seated rest break secondary to fatigue. Heavy reliance on UE support   Stairs             Wheelchair Mobility    Modified Rankin (Stroke Patients Only)       Balance Overall balance assessment: Needs assistance Sitting-balance support: No upper extremity supported;Feet supported Sitting balance-Leahy Scale: Fair Sitting balance - Comments: Able to reach bilat feet while seated edge of chair   Standing balance support: Bilateral upper extremity supported;During functional activity Standing balance-Leahy Scale: Poor Standing balance comment: Heavy reliance on UE support and external assist                            Cognition Arousal/Alertness: Awake/alert Behavior During Therapy: WFL for tasks assessed/performed;Anxious Overall Cognitive Status: Impaired/Different from baseline Area of Impairment: Attention                   Current Attention Level: Selective           General Comments: Pt distracted by pain and current condition. Repeated herself at least 10x "I was walking fine before all this... my legs are numb..." When asked if she was aware she was repeating herself, pt states yes, she is doing so because she's concerned      Exercises      General Comments General comments (skin integrity, edema, etc.):  Communicated concern with NP (via Epic secure chat) regarding Well's criteria score 2-3 given pt's h/o DVT and worsening pain      Pertinent Vitals/Pain Pain Assessment: Faces Faces Pain Scale: Hurts even more Pain Location:  Bilateral lower legs Pain Descriptors / Indicators: Aching;Numbness;Discomfort;Pounding Pain Intervention(s): Limited activity within patient's tolerance;Monitored during session;Premedicated before session;Other (comment)(relayed concern for potential DVT to RN and NP)    Home Living                      Prior Function            PT Goals (current goals can now be found in the care plan section) Acute Rehab PT Goals Patient Stated Goal: Get back to normal, I wasn't like this a few weeks ago; play with grandkids PT Goal Formulation: With patient Time For Goal Achievement: 06/07/18 Potential to Achieve Goals: Good Progress towards PT goals: Progressing toward goals    Frequency    Min 3X/week      PT Plan Current plan remains appropriate    Co-evaluation              AM-PAC PT "6 Clicks" Daily Activity  Outcome Measure  Difficulty turning over in bed (including adjusting bedclothes, sheets and blankets)?: A Little Difficulty moving from lying on back to sitting on the side of the bed? : A Little Difficulty sitting down on and standing up from a chair with arms (e.g., wheelchair, bedside commode, etc,.)?: Unable Help needed moving to and from a bed to chair (including a wheelchair)?: A Lot Help needed walking in hospital room?: A Lot Help needed climbing 3-5 steps with a railing? : A Lot 6 Click Score: 13    End of Session Equipment Utilized During Treatment: Gait belt Activity Tolerance: Patient limited by pain Patient left: in bed;with call bell/phone within reach;with bed alarm set Nurse Communication: Mobility status PT Visit Diagnosis: Other abnormalities of gait and mobility (R26.89);Unsteadiness on feet (R26.81);Muscle weakness (generalized) (M62.81)     Time: 3267-1245 PT Time Calculation (min) (ACUTE ONLY): 21 min  Charges:  $Gait Training: 8-22 mins                    Mabeline Caras, PT, DPT Acute Rehabilitation Services  Pager  [phone] Office [place] 05/25/2018, 4:01 PM

## 2018-05-25 NOTE — Patient Outreach (Signed)
[facility]) Care Management  05/25/2018  [name] [birth year] [id number]   Initial outreach to [name] regarding social work referral for post hospital follow up for transportation and food resources.  [name] was receiving therapy at the time of the call and requested a call back tomorrow.   BSW will call her then.  [name], BSW Social Worker [phone]

## 2018-05-25 NOTE — Progress Notes (Signed)
Patient called out stating her bilateral (neuropathic) leg pain feels worse now and states both her calfs feel very tight/painful. Morphine 2mg  IVP given per PRN orders. Paged Hospitalist to let them know. Dyanne Carrel, NP called back stating she was aware. No new orders at this time.

## 2018-05-25 NOTE — Clinical Social Work Placement (Signed)
   CLINICAL SOCIAL WORK PLACEMENT  NOTE  Date:  05/25/2018  Patient Details  Name: [name] MRN: [id number] Date of Birth: [birth year]  Clinical Social Work is seeking post-discharge placement for this patient at the [place] level of care (*CSW will initial, date and re-position this form in  chart as items are completed):  Yes   Patient/family provided with [facility]'s list of facilities offering this level of care within the geographic area requested by the patient (or if unable, by the patient's family).  Yes   Patient/family informed of their freedom to choose among providers that offer the needed level of care, that participate in Medicare, Medicaid or managed care program needed by the patient, have an available bed and are willing to accept the patient.  Yes   Patient/family informed of [facility]'s ownership interest in [facility] and [facility], as well as of the fact that they are under no obligation to receive care at these facilities.  PASRR submitted to EDS on 05/25/18     PASRR number received on       Existing PASRR number confirmed on 05/25/18     FL2 transmitted to all facilities in geographic area requested by pt/family on 05/25/18     FL2 transmitted to all facilities within larger geographic area on       Patient informed that his/her managed care company has contracts with or will negotiate with certain facilities, including the following:            Patient/family informed of bed offers received.  Patient chooses bed at       Physician recommends and patient chooses bed at      Patient to be transferred to   on  .  Patient to be transferred to facility by       Patient family notified on   of transfer.  Name of family member notified:        PHYSICIAN Please sign FL2     Additional Comment:    _______________________________________________ [name], LCSW 05/25/2018,  12:03 PM

## 2018-05-25 NOTE — Progress Notes (Signed)
[facility] PROGRESS NOTE  ERMINE STEBBINS GDJ:[id number] DOB: [birth year] DOA: 05/23/2018 PCP: [name], MD  Assessment/Plan:  Generalized weakness: patient ready for discharge 10/21 and evaluated by PT. Demonstrated very unsteady gait with increased sway during transfer according to PT note. Complained of feeling woozy. Not orthostatic. No fever or s/sx infection. PT recommended CIR. Currently awaiting insurance approval. Of note patient has also agreed to snf if unable to get to CIR -social worker consult  Chest pain:Patient has atypical chest pain,which is pleuritic. May be related to mild copd exacerbation.  CT angiogram is negative for PE. Troponin negative x3 and ekg without changes. No events on tele Pain free this am.  Patient is anxious,may be related to anxiety.  -mylanta - follow up with PCP 1-2 weeks for evaluation of chest pain  COPD:Patient has slightly decreased air movement. No productive cough, did not seem to have COPD exacerbation but was provided solumedrol per ED md. Chest xray as noted above -DuoNeb nebulizers -scheduled albuterol nebulizers -As needed Mucinex for cough  Seizure/elevated dilantin level -hx of same. Dilantin level 26. Last hospitalization dilantin level 33.  -hold dilantin -spoke with Dr Julianne Rice office who will contact patient for lab work to be drawn 10/24. Will need dilantin level. Results will need to be evaluated and recommendations for new dose made -will need to follow up with CIR or snf placement of need for dilantin level 10/24.  GERD: -Protonixprn  Neuropathy: lower extremities. Home meds include neurontin. Complains increased pain this am. Concerned about DVT as had hx of same. Legs without erythema. Mild tenderness bilaterally all over legs particularly feet.  -increase neurontin -OP follow up   Code Status: full Family Communication: none presnet Disposition Plan: to CIR or snf tomorrow   Consultants:  patel  rehab  Procedures:  none  Antibiotics:  none  HPI/Subjective: Complains of worsening "squeezing" pain in both legs up to above knees  62 year old female admitted to our facility with chest pain which was found to be pleuritic and atypical.  Likely related to mild exacerbation of COPD.  CT angiogram was negative troponin was negative and patient felt stable to discharge home and follow-up with her PCP in 1 to 2 weeks for further chest pain evaluation.  Of note patient's Dilantin level remains elevated we have discussed with pharmacy and our plan is to hold her Dilantin altogether and follow-up with Dr. Maudie Mercury on Thursday the 24th for Dilantin level.  It is likely she will require a decreased dose. Was ready for discharge yesterday and during PT eval became very weak with unstable gait. PT recommended CIR   Objective: Vitals:   05/25/18 0805 05/25/18 1146  BP:  123/62  Pulse: 75 86  Resp: 18 18  Temp:  98.6 F (37 C)  SpO2: 97% 93%    Intake/Output Summary (Last 24 hours) at 05/25/2018 1423 Last data filed at 05/25/2018 1153 Gross per 24 hour  Intake 480 ml  Output 1850 ml  Net -1370 ml   Filed Weights   05/23/18 2237 05/24/18 0555 05/25/18 0517  Weight: 57.2 kg 57.4 kg 56.6 kg    Exam:   General:  Thin frail chronically ill appearing and quite anxious  Cardiovascular: rrr no mgr no LE edema  Respiratory: normal effort somewhat shallow BS clear bilaterally no wheeze  Abdomen: flat soft +BS no guarding or rebounding  Musculoskeletal: joints without swelling/erythema   Data Reviewed: Basic Metabolic Panel: Recent Labs  Lab 05/23/18 1647  NA 140  K 3.9  CL 109  CO2 23  GLUCOSE 88  BUN 11  CREATININE 0.70  CALCIUM 9.1   Liver Function Tests: Recent Labs  Lab 05/23/18 2254  ALBUMIN 3.6   No results for input(s): LIPASE, AMYLASE in the last 168 hours. No results for input(s): AMMONIA in the last 168 hours. CBC: Recent Labs  Lab 05/23/18 1647  WBC  5.3  HGB 13.7  HCT 41.8  MCV 101.2*  PLT 147*   Cardiac Enzymes: Recent Labs  Lab 05/23/18 2254 05/24/18 0556 05/24/18 0846  TROPONINI <0.03 <0.03 <0.03   BNP (last 3 results) Recent Labs    05/23/18 2254  BNP 55.6    ProBNP (last 3 results) No results for input(s): PROBNP in the last 8760 hours.  CBG: No results for input(s): GLUCAP in the last 168 hours.  No results found for this or any previous visit (from the past 240 hour(s)).   Studies: Dg Chest 2 View  Result Date: 05/23/2018 CLINICAL DATA:  Central chest pain radiating to left arm. EXAM: CHEST - 2 VIEW COMPARISON:  Body CT 03/24/2018 FINDINGS: Cardiomediastinal silhouette is normal. Mediastinal contours appear intact. Calcific atherosclerotic disease the aorta. There is no evidence of focal airspace consolidation, pleural effusion or pneumothorax. Minimal right middle lobe atelectasis. Emphysematous changes and hyperinflation of the lungs. Osseous structures are without acute abnormality. Exaggerated thoracic kyphosis. Posttraumatic deformity of the right clavicle. Soft tissues are grossly normal. IMPRESSION: Emphysematous changes and hyperinflation of the lungs. Improved right middle lobe atelectasis. Calcific atherosclerotic disease of the aorta. Electronically Signed   By: Fidela Salisbury M.D.   On: 05/23/2018 17:30   Ct Head Wo Contrast  Result Date: 05/24/2018 CLINICAL DATA:  Ataxia. EXAM: CT HEAD WITHOUT CONTRAST TECHNIQUE: Contiguous axial images were obtained from the base of the skull through the vertex without intravenous contrast. COMPARISON:  CT 03/19/2018 FINDINGS: Brain: Large area of encephalomalacia in the posterior right cerebral hemisphere, stable. No acute intracranial abnormality. Specifically, no hemorrhage, hydrocephalus, mass lesion, acute infarction, or significant intracranial injury. Vascular: No hyperdense vessel or unexpected calcification. Skull: No acute calvarial abnormality. Right  parietal craniotomy, stable. Sinuses/Orbits: Visualized paranasal sinuses and mastoids clear. Orbital soft tissues unremarkable. Other: None IMPRESSION: Large area of encephalomalacia in the posterior right cerebral hemisphere. No acute intracranial abnormality. Electronically Signed   By: Rolm Baptise M.D.   On: 05/24/2018 22:25   Ct Angio Chest Pe W And/or Wo Contrast  Result Date: 05/23/2018 CLINICAL DATA:  Chest pain. EXAM: CT ANGIOGRAPHY CHEST WITH CONTRAST TECHNIQUE: Multidetector CT imaging of the chest was performed using the standard protocol during bolus administration of intravenous contrast. Multiplanar CT image reconstructions and MIPs were obtained to evaluate the vascular anatomy. CONTRAST:  16mL ISOVUE-370 IOPAMIDOL (ISOVUE-370) INJECTION 76% COMPARISON:  Radiographs of same day.  CT scan of March 24, 2018. FINDINGS: Cardiovascular: Satisfactory opacification of the pulmonary arteries to the segmental level. No evidence of pulmonary embolism. Normal heart size. No pericardial effusion. Atherosclerosis of thoracic aorta is noted without aneurysm or dissection. Mediastinum/Nodes: No enlarged mediastinal, hilar, or axillary lymph nodes. Thyroid gland, trachea, and esophagus demonstrate no significant findings. Lungs/Pleura: Lungs are clear. No pleural effusion or pneumothorax. Upper Abdomen: No acute abnormality. Musculoskeletal: No chest wall abnormality. No acute or significant osseous findings. Review of the MIP images confirms the above findings. IMPRESSION: No definite evidence of pulmonary embolus. No acute abnormality seen in the chest. Aortic Atherosclerosis (ICD10-I70.0). Electronically Signed   By: Marijo Conception, M.D.  On: 05/23/2018 19:37    Scheduled Meds: . enoxaparin (LOVENOX) injection  40 mg Subcutaneous Q24H  . gabapentin  300 mg Oral BID  . ipratropium-albuterol  3 mL Nebulization TID  . lacosamide  200 mg Oral BID   Continuous Infusions:  Principal Problem:    Chest pain Active Problems:   COPD with exacerbation (HCC)   Generalized weakness   Anxiety state   GERD (gastroesophageal reflux disease)   Tobacco abuse   Neuropathy   Seizure (HCC)   History of CVA (cerebrovascular accident)   Pain   Chronic back pain    Time spent: 81 mintues    [facility]  If 7PM-7AM, please contact night-coverage at [email or web address], password [facility] 05/25/2018, 2:23 PM  LOS: 0 days

## 2018-05-26 ENCOUNTER — Ambulatory Visit: Payer: Self-pay | Admitting: Pharmacist

## 2018-05-26 ENCOUNTER — Other Ambulatory Visit: Payer: Self-pay

## 2018-05-26 ENCOUNTER — Ambulatory Visit: Payer: Self-pay

## 2018-05-26 DIAGNOSIS — R569 Unspecified convulsions: Secondary | ICD-10-CM | POA: Diagnosis not present

## 2018-05-26 DIAGNOSIS — G629 Polyneuropathy, unspecified: Secondary | ICD-10-CM

## 2018-05-26 DIAGNOSIS — R079 Chest pain, unspecified: Secondary | ICD-10-CM | POA: Diagnosis not present

## 2018-05-26 DIAGNOSIS — R29898 Other symptoms and signs involving the musculoskeletal system: Secondary | ICD-10-CM

## 2018-05-26 DIAGNOSIS — F411 Generalized anxiety disorder: Secondary | ICD-10-CM

## 2018-05-26 DIAGNOSIS — J441 Chronic obstructive pulmonary disease with (acute) exacerbation: Secondary | ICD-10-CM | POA: Diagnosis not present

## 2018-05-26 LAB — VITAMIN B12: Vitamin B-12: 197 pg/mL (ref 180–914)

## 2018-05-26 MED ORDER — CYANOCOBALAMIN 250 MCG PO TABS: 250.0000 ug | ORAL_TABLET | Freq: Every day | ORAL | Status: DC

## 2018-05-26 MED ORDER — GABAPENTIN 300 MG PO CAPS: 300.0000 mg | ORAL_CAPSULE | Freq: Two times a day (BID) | ORAL | Status: DC

## 2018-05-26 MED ORDER — CYANOCOBALAMIN 500 MCG PO TABS
250.0000 ug | ORAL_TABLET | Freq: Every day | ORAL | Status: DC
  Administered 2018-05-26: 250 ug via ORAL
  Filled 2018-05-26: qty 1

## 2018-05-26 NOTE — Discharge Summary (Addendum)
Physician Discharge Summary  [name] DJS:[id number] DOB: 1956/02/14 DOA: 05/23/2018  PCP: [name], MD  Admit date: 05/23/2018 Discharge date: 05/26/2018  Admitted From: home Discharge disposition: home---refused SNF   Recommendations for Outpatient Follow-Up:   1. Check B12 levels after replacement 2. Dilantin level on 10/24 will need new dose started then   Discharge Diagnosis:   Principal Problem:   Chest pain Active Problems:   Seizure ([place])   COPD with exacerbation (HCC)   Anxiety state   GERD (gastroesophageal reflux disease)   Tobacco abuse   History of CVA (cerebrovascular accident)   Pain   Generalized weakness   Chronic back pain   Neuropathy   Weakness of both lower extremities    Discharge Condition: Improved.  Diet recommendation: Low sodium, heart healthy  Wound care: None.  Code status: Full.   History of Present Illness:   [name] is a 62 y.o. 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  Patient states that her chest pain started this in evening, which is located in the central chest, initially 10 out of 10 severity, currently 2 out of 10 in severity, sharp, radiating to the left arm, pleuritic, aggravated by deep breath.  It is associated with shortness of breath and mild dry cough.  She has nausea, but no vomiting, diarrhea or abdominal pain.  No tenderness in the calf areas.  Denies fever or chills.  Patient does not have symptoms of UTI or unilateral weakness.  Patient states that she feels lightheaded at home. Of note, patient has history of intracranial bleeding, anal was told by neurologist that she should not take aspirin per patient.   Hospital Course by Problem:   Generalized weakness: patient ready for discharge 10/21 and evaluated by PT. Demonstrated very unsteady gait with increased sway during  transfer according to PT note. Complained of feeling woozy. Not orthostatic. No fever or s/sx infection. PT recommended CIR. Insurance denies. Of note patient has also agreed to snf if unable to get to CIR but now refusing  Chest pain:Patient has atypical chest pain,which is pleuritic.May be related to mild copd exacerbation.CT angiogram is negative for PE. Troponin negative x3 and ekg without changes -Patient is anxious,may be related to anxiety. -mylanta -follow up with PCP 1-2 weeks for evaluation of chest pain  COPD:Patient has slightly decreased air movement. No productive cough, didnot seem to have COPD exacerbation but was provided solumedrol per ED md. Chest xray as noted above -DuoNeb nebulizers -scheduledalbuterol nebulizers -As needed Mucinex for cough  Seizure/elevated dilantin level -hx of same. Dilantin level 26. Last hospitalization dilantin level 33.  -hold dilantin -level needs to be drawn 10/24. (most recent dose: AM 160 mg and PM 200 mg)  GERD: -Protonixprn  Neuropathy: lower extremities. Home meds include neurontin, will increase dose -replace low B12   Medical Consultants:      Discharge Exam:   Vitals:   05/26/18 1333 05/26/18 1353  BP: (!) 112/54   Pulse: 83 93  Resp: 18 18  Temp: 98.4 F (36.9 C)   SpO2: 97% 94%   Vitals:   05/26/18 0816 05/26/18 0851 05/26/18 1333 05/26/18 1353  BP: 108/60  (!) 112/54   Pulse: 75 77 83 93  Resp: 18 18 18 18   Temp:   98.4 F (36.9 C)   TempSrc:   Oral   SpO2:  93% 97% 94%  Weight:      Height:        General exam: Appears calm and comfortable.  The results of significant diagnostics from this hospitalization (including imaging, microbiology, ancillary and laboratory) are listed below for reference.     Procedures and Diagnostic Studies:   Dg Chest 2 View  Result Date: 05/23/2018 CLINICAL DATA:  Central chest pain radiating to left arm. EXAM: CHEST - 2 VIEW COMPARISON:  Body CT  03/24/2018 FINDINGS: Cardiomediastinal silhouette is normal. Mediastinal contours appear intact. Calcific atherosclerotic disease the aorta. There is no evidence of focal airspace consolidation, pleural effusion or pneumothorax. Minimal right middle lobe atelectasis. Emphysematous changes and hyperinflation of the lungs. Osseous structures are without acute abnormality. Exaggerated thoracic kyphosis. Posttraumatic deformity of the right clavicle. Soft tissues are grossly normal. IMPRESSION: Emphysematous changes and hyperinflation of the lungs. Improved right middle lobe atelectasis. Calcific atherosclerotic disease of the aorta. Electronically Signed   By: Fidela Salisbury M.D.   On: 05/23/2018 17:30   Ct Head Wo Contrast  Result Date: 05/24/2018 CLINICAL DATA:  Ataxia. EXAM: CT HEAD WITHOUT CONTRAST TECHNIQUE: Contiguous axial images were obtained from the base of the skull through the vertex without intravenous contrast. COMPARISON:  CT 03/19/2018 FINDINGS: Brain: Large area of encephalomalacia in the posterior right cerebral hemisphere, stable. No acute intracranial abnormality. Specifically, no hemorrhage, hydrocephalus, mass lesion, acute infarction, or significant intracranial injury. Vascular: No hyperdense vessel or unexpected calcification. Skull: No acute calvarial abnormality. Right parietal craniotomy, stable. Sinuses/Orbits: Visualized paranasal sinuses and mastoids clear. Orbital soft tissues unremarkable. Other: None IMPRESSION: Large area of encephalomalacia in the posterior right cerebral hemisphere. No acute intracranial abnormality. Electronically Signed   By: Rolm Baptise M.D.   On: 05/24/2018 22:25   Ct Angio Chest Pe W And/or Wo Contrast  Result Date: 05/23/2018 CLINICAL DATA:  Chest pain. EXAM: CT ANGIOGRAPHY CHEST WITH CONTRAST TECHNIQUE: Multidetector CT imaging of the chest was performed using the standard protocol during bolus administration of intravenous contrast.  Multiplanar CT image reconstructions and MIPs were obtained to evaluate the vascular anatomy. CONTRAST:  138mL ISOVUE-370 IOPAMIDOL (ISOVUE-370) INJECTION 76% COMPARISON:  Radiographs of same day.  CT scan of March 24, 2018. FINDINGS: Cardiovascular: Satisfactory opacification of the pulmonary arteries to the segmental level. No evidence of pulmonary embolism. Normal heart size. No pericardial effusion. Atherosclerosis of thoracic aorta is noted without aneurysm or dissection. Mediastinum/Nodes: No enlarged mediastinal, hilar, or axillary lymph nodes. Thyroid gland, trachea, and esophagus demonstrate no significant findings. Lungs/Pleura: Lungs are clear. No pleural effusion or pneumothorax. Upper Abdomen: No acute abnormality. Musculoskeletal: No chest wall abnormality. No acute or significant osseous findings. Review of the MIP images confirms the above findings. IMPRESSION: No definite evidence of pulmonary embolus. No acute abnormality seen in the chest. Aortic Atherosclerosis (ICD10-I70.0). Electronically Signed   By: Marijo Conception, M.D.   On: 05/23/2018 19:37     Labs:   Basic Metabolic Panel: Recent Labs  Lab 05/23/18 1647  NA 140  K 3.9  CL 109  CO2 23  GLUCOSE 88  BUN 11  CREATININE 0.70  CALCIUM 9.1   GFR Estimated Creatinine Clearance: 68.8 mL/min (by C-G formula based on SCr of 0.7 mg/dL). Liver Function Tests: Recent Labs  Lab 05/23/18 2254  ALBUMIN 3.6   No results for input(s): LIPASE, AMYLASE in the last 168 hours. No results for input(s): AMMONIA in the last 168 hours. Coagulation profile No results for input(s): INR, PROTIME in the  last 168 hours.  CBC: Recent Labs  Lab 05/23/18 1647  WBC 5.3  HGB 13.7  HCT 41.8  MCV 101.2*  PLT 147*   Cardiac Enzymes: Recent Labs  Lab 05/23/18 2254 05/24/18 0556 05/24/18 0846  TROPONINI <0.03 <0.03 <0.03   BNP: Invalid input(s): POCBNP CBG: No results for input(s): GLUCAP in the last 168 hours. D-Dimer No  results for input(s): DDIMER in the last 72 hours. Hgb A1c Recent Labs    05/24/18 0556  HGBA1C 5.2   Lipid Profile Recent Labs    05/24/18 0556  CHOL 161  HDL 74  LDLCALC 81  TRIG 31  CHOLHDL 2.2   Thyroid function studies No results for input(s): TSH, T4TOTAL, T3FREE, THYROIDAB in the last 72 hours.  Invalid input(s): FREET3 Anemia work up Recent Labs    05/26/18 0831  [id number] 371   Microbiology No results found for this or any previous visit (from the past 240 hour(s)).   Discharge Instructions:   Discharge Instructions    AMB Referral to [place] Management   Complete by:  As directed    Please assign patient to social worker for post hospital follow up for ongoing transportation issues to primary and food resource.  Please see if patient qualifies for [facility] transportation and [employer] programs. [[facility] CSW had worked on [place].  Patient would like Pharmacist follow up regarding Dilantin follow up. Questions please call:   Natividad Brood, RN BSN [facility] Liaison  [phone] business mobile phone Toll free office [phone]    Natividad Brood, RN BSN [facility] Liaison  [phone] business mobile phone Toll free office [phone]   Reason for consult:  Transportation issues ongoing and Medication assessment   Diagnoses of:  Other   Other Diagnosis:  Seizure DO   Expected date of contact:  1-3 days (reserved for hospital discharges)   Increase activity slowly   Complete by:  As directed      Allergies as of 05/26/2018      Reactions   Zonisamide Other (See Comments)   Numbness and tingling over whole body   Codeine Nausea And Vomiting   Keppra [levetiracetam] Other (See Comments)   Causes seizures.    Pregabalin Nausea And Vomiting   Olive Oil Rash      Medication List    STOP taking these medications   phenytoin 100 MG ER capsule Commonly known as:  DILANTIN   phenytoin 200 MG ER  capsule Commonly known as:  DILANTIN   phenytoin 30 MG ER capsule Commonly known as:  DILANTIN     TAKE these medications   acetaminophen 325 MG tablet Commonly known as:  TYLENOL Take 325 mg by mouth daily as needed for headache (pain).   albuterol 108 (90 Base) MCG/ACT inhaler Commonly known as:  PROVENTIL HFA;VENTOLIN HFA Inhale 2 puffs into the lungs every 6 (six) hours as needed for wheezing or shortness of breath.   gabapentin 300 MG capsule Commonly known as:  NEURONTIN Take 1 capsule (300 mg total) by mouth 2 (two) times daily. What changed:    how much to take  how to take this  when to take this  additional instructions   lacosamide 200 MG Tabs tablet Commonly known as:  VIMPAT Take 1 tablet (200 mg total) by mouth 2 (two) times daily.   vitamin B-12 250 MCG tablet Commonly known as:  CYANOCOBALAMIN Take 1 tablet (250 mcg total) by mouth daily. Start taking on:  05/27/2018 What changed:    medication strength  how much to take      Follow-up Information    [name], MD Follow up in 1 week(s).   Specialty:  Internal Medicine Contact information: [address] [place] [address] [phone]            Time coordinating discharge: 25 min  Signed:  Geradine Girt  [facility] 05/26/2018, 2:56 PM

## 2018-05-26 NOTE — Clinical Social Work Note (Addendum)
Insurance denied CIR admission. CSW gave patient bed offers. She will call her children now to discuss and call CSW with decision.  Dayton Scrape, [place] [phone]  3:15 pm Patient has decided to return home and feels safe doing so. Her daughter has an office at her home so she is there throughout the day. She also has a supportive sister and cousins. Patient agreeable to HHPT. RNCM and RN aware.  CSW signing off.   Dayton Scrape, [place]

## 2018-05-26 NOTE — Progress Notes (Signed)
Brief Note:   Subjective: Patient seen laying in bed.  She continues to complain of LE heaviness and sensory deficits.  ROS: +LE neuropathy.  Denies CP, SOB, N/V/D.  PE: Constitutional: No distress . Vital signs reviewed. HENT: Normocephalic.  Atraumatic. Eyes: EOMI. No discharge. Cardiovascular: RRR. No JVD. Respiratory: CTA Bilaterally. Normal effort. GI: BS +. Non-distended. Musc: No edema or tenderness in extremities. Neurology: LLE: HF, KE 3-/5, ADF 2-/5 RLE: HF, KE 3-/5, ADDF 2/5  Assessment/Plan:  1. COPD exacerbation  Cont therapies  Denied by insurance for IRF, if caregiver support unavailable, recommend SNF  ?Increasing weakness, consider weaning steroids

## 2018-05-26 NOTE — Progress Notes (Signed)
Inpatient Rehabilitation-Admissions Coordinator   Pt's insurance has denied her request for CIR. AC has updated pt and Dr. Eliseo Squires on the determination. AC has communicated with CSW and need for new dispo plans.  AC will sign off. Please call if questions.   Jhonnie Garner, OTR/L  Rehab Admissions Coordinator  [phone] 05/26/2018 2:32 PM

## 2018-05-26 NOTE — Progress Notes (Signed)
Await insurance auth for CIR if able.  Neurontin recently increased. B12 lower end of normal-- will try PO supplementation and recheck as outpatient.  Doubt blood clots.  If patient can tolerate with her neuropathy, may benefit from compression stockings. Eulogio Bear DO

## 2018-05-26 NOTE — Care Management Note (Signed)
Case Management Note  Patient Details  Name: [name] MRN: [id number] Date of Birth: 1955-11-26  Subjective/Objective:  Chest Pain                 Action/Plan: Patient lives at home alone; daughter assist as needed; PCP is Dr [name]; has private insurance with [facility] Medicare with prescription drug coverage; [facility] [address]; she is refusing SNF placement and wants to return home at discharge; [facility] choice offered, pt chose [facility]; Dan with [facility] called for arrangements; pt states that she has a rolling walker at home;   Expected Discharge Date:  05/26/18               Expected Discharge Plan:  [place]  Discharge planning Services  CM Consult  Choice offered to:  Patient  HH Arranged:  RN, PT [facility] Agency:  [place]  Status of Service:  In process, will continue to follow  Sherrilyn Rist [phone] 05/26/2018, 3:29 PM

## 2018-05-26 NOTE — Patient Outreach (Signed)
[place] [facility]) Care Management  05/26/2018  [name] 10-Jul-1956 [id number]   Outreach attempt regarding social work referral for assistance with setting up [employer] meals post hospital discharge and transportation resources.  BSW left voicemail message; still inpatient.  Will attempt to reach again within the next four business days.    [name], BSW Social Worker [phone]

## 2018-05-26 NOTE — Progress Notes (Signed)
Patient discharged: Home with family  Via: Wheelchair   Discharge paperwork given: to patient and family by charge nurse [place] with teach back  IV and telemetry disconnected by NT  Belongings given to patient  Patient decided not to wait for walker, she stated she has one at home.  CM notified, pt d/c

## 2018-05-27 ENCOUNTER — Telehealth: Payer: Self-pay | Admitting: Neurology

## 2018-05-27 ENCOUNTER — Ambulatory Visit: Payer: Self-pay | Admitting: Pharmacist

## 2018-05-27 NOTE — Telephone Encounter (Signed)
Patient called and lmom regarding her having been in the hospital due to her Dilantin levels being very high. She was advised to contact our office to be seen. Please Advise. Thank you

## 2018-05-27 NOTE — Telephone Encounter (Signed)
I do not see where pt was advised to contact us to be seen sooner than already scheduled appointment in December. pls advise.

## 2018-05-28 ENCOUNTER — Other Ambulatory Visit: Payer: Self-pay

## 2018-05-28 ENCOUNTER — Other Ambulatory Visit: Payer: Self-pay | Admitting: Pharmacist

## 2018-05-28 NOTE — Telephone Encounter (Signed)
I have a 10am cancellation this Monday, can she come in then? Thanks!

## 2018-05-28 NOTE — Patient Outreach (Addendum)
[place] [facility]) Care Management  [place]   05/31/2018  OZIE DIMARIA 04/04/1956 [id number]   Reason for referral: medication management/questions  Referral source: [facility] RN CM Current insurance:[employer]   PMHx: seizures, COPD/asthma, GERD, DDD   HPI: Sucessful outreach call to Ms. Dercole with HIPAA identifiers verified.  Patient states she felt nauseated overnight, but overall she states she is doing well and better than when in the hospital.Patient was referred to [place] for medication related question (phenytoin).  She states that she has been on medication for seizures for 30 years.  She sates they have been well controlled, however she still has breakthrough seizures every now and then.  Patient asked about supratherapeutic phenytoin level during recent hospitalization.  Per discharge summary, patient to have level drawn on 05/27/18, however patient states she is having level drawn on 05/31/18.  [facility] pharmacist asked if medications were stopped or new medications started to provoke supratherapeutic level.  Patient states nothing changed prior to her hospitalization.  [facility] Pharmacist reviewed medication list.  [facility] Pharmacist will defer further management of seizures to neurologist and encouraged patient to call neurologist or seek emergency care if emergency arises.  Patient able to afford medications.    Objective: Lab Results  Component Value Date   CREATININE 0.67 05/31/2018   CREATININE 0.71 05/30/2018   CREATININE 0.80 05/29/2018    Lab Results  Component Value Date   HGBA1C 5.2 05/24/2018    Lipid Panel     Component Value Date/Time   CHOL 161 05/24/2018 0556   TRIG 31 05/24/2018 0556   HDL 74 05/24/2018 0556   CHOLHDL 2.2 05/24/2018 0556   VLDL 6 05/24/2018 0556   LDLCALC 81 05/24/2018 0556    BP Readings from Last 3 Encounters:  05/31/18 120/65  05/26/18 (!) 112/54  04/07/18 108/70    Allergies  Allergen Reactions  .  Zonisamide Other (See Comments)    Numbness and tingling over whole body  . Codeine Nausea And Vomiting  . Keppra [Levetiracetam] Other (See Comments)    Causes seizures  . Pregabalin Nausea And Vomiting  . Olive Oil Rash    Medications Reviewed Today    Reviewed by Dessie Coma, CPhT (Pharmacy Technician) on 05/29/18 at 2359  Med List Status: Complete  Medication Order Taking? Sig Documenting Provider Last Dose Status Informant  acetaminophen (TYLENOL) 325 MG tablet [id number] No Take 325 mg by mouth every 6 (six) hours as needed (for pain or headaches).  [provider] Helmut Muster Active Family Member  albuterol (PROVENTIL HFA;VENTOLIN HFA) 108 (90 Base) MCG/ACT inhaler [id number] Yes Inhale 2 puffs into the lungs every 6 (six) hours as needed for wheezing or shortness of breath. [provider] Helmut Muster Active Family Member  gabapentin (NEURONTIN) 300 MG capsule [id number] Yes Take 1 capsule (300 mg total) by mouth 2 (two) times daily. Geradine Girt, DO 05/29/2018 Unknown time Active Family Member  lacosamide (VIMPAT) 200 MG TABS tablet [id number] Yes Take 1 tablet (200 mg total) by mouth 2 (two) times daily. [name], MD 05/29/2018 Unknown time Active Family Member           Med Note Quinn Axe May 23, 2018  5:54 PM) #60 filled 04/30/18 [employer] per bottle  vitamin B-12 (CYANOCOBALAMIN) 250 MCG tablet [id number] No Take 1 tablet (250 mcg total) by mouth daily. Geradine Girt, DO Not yet Not yet Active Family Member  Assessment:  Drugs sorted by system:  Neurologic/Psychologic: lacosamide, gabapentin, phenytoin (currently holding)  Pulmonary: albuterol  Pain: APAP  Vitamins/Minerals/Supplements: vitB   Plan: -I will defer further management to neurologist and close case  -Encouraged patient to call about upcoming labs as labs delayed  Regina Eck, PharmD, [place]  [phone]

## 2018-05-28 NOTE — Telephone Encounter (Signed)
She took that appointment. Thank you

## 2018-05-28 NOTE — Patient Outreach (Signed)
[place] [facility]) Care Management  05/28/2018  [name] 12-22-1955 [id number]   Successful outreach to patient regarding social work referral for transportation and food resources.  BSW educated [name] about transportation resources through [place].  BSW talked with [name] about [name] services and process for eligibility.  [name] denied food insecurity but agreed to [facility] sending resource information. BSW talked with [name] about [employer].  She requested post hospital discharge meals. BSW mailed information on [facility] [employer].  BSW mailed [place] and [employer] with food resources.  BSW completed and faxed [name] application.  BSW submitted referral for [employer].  BSW will follow up next week to ensure receipt of resources and to get update on status of [name].   [name], BSW Social Worker [phone]

## 2018-05-29 ENCOUNTER — Observation Stay (HOSPITAL_COMMUNITY)
Admission: EM | Admit: 2018-05-29 | Discharge: 2018-06-01 | Disposition: A | Discharge status: Home / Self Care | Payer: Medicare HMO | Attending: Internal Medicine | Admitting: Internal Medicine

## 2018-05-29 ENCOUNTER — Encounter (HOSPITAL_COMMUNITY): Payer: Self-pay | Admitting: Emergency Medicine

## 2018-05-29 ENCOUNTER — Emergency Department (HOSPITAL_COMMUNITY): Payer: Medicare HMO

## 2018-05-29 DIAGNOSIS — G92 Toxic encephalopathy: Secondary | ICD-10-CM | POA: Diagnosis not present

## 2018-05-29 DIAGNOSIS — Z87442 Personal history of urinary calculi: Secondary | ICD-10-CM | POA: Insufficient documentation

## 2018-05-29 DIAGNOSIS — Z885 Allergy status to narcotic agent status: Secondary | ICD-10-CM | POA: Insufficient documentation

## 2018-05-29 DIAGNOSIS — G40409 Other generalized epilepsy and epileptic syndromes, not intractable, without status epilepticus: Principal | ICD-10-CM | POA: Insufficient documentation

## 2018-05-29 DIAGNOSIS — G629 Polyneuropathy, unspecified: Secondary | ICD-10-CM | POA: Diagnosis not present

## 2018-05-29 DIAGNOSIS — R569 Unspecified convulsions: Secondary | ICD-10-CM

## 2018-05-29 DIAGNOSIS — S01111A Laceration without foreign body of right eyelid and periocular area, initial encounter: Secondary | ICD-10-CM | POA: Diagnosis not present

## 2018-05-29 DIAGNOSIS — R509 Fever, unspecified: Secondary | ICD-10-CM

## 2018-05-29 DIAGNOSIS — Z91018 Allergy to other foods: Secondary | ICD-10-CM | POA: Insufficient documentation

## 2018-05-29 DIAGNOSIS — Z8679 Personal history of other diseases of the circulatory system: Secondary | ICD-10-CM | POA: Diagnosis not present

## 2018-05-29 DIAGNOSIS — Z888 Allergy status to other drugs, medicaments and biological substances status: Secondary | ICD-10-CM | POA: Insufficient documentation

## 2018-05-29 DIAGNOSIS — Z7951 Long term (current) use of inhaled steroids: Secondary | ICD-10-CM | POA: Insufficient documentation

## 2018-05-29 DIAGNOSIS — N390 Urinary tract infection, site not specified: Secondary | ICD-10-CM | POA: Insufficient documentation

## 2018-05-29 DIAGNOSIS — S199XXA Unspecified injury of neck, initial encounter: Secondary | ICD-10-CM | POA: Diagnosis not present

## 2018-05-29 DIAGNOSIS — D696 Thrombocytopenia, unspecified: Secondary | ICD-10-CM | POA: Diagnosis present

## 2018-05-29 DIAGNOSIS — A419 Sepsis, unspecified organism: Secondary | ICD-10-CM | POA: Diagnosis present

## 2018-05-29 DIAGNOSIS — R4182 Altered mental status, unspecified: Secondary | ICD-10-CM | POA: Diagnosis not present

## 2018-05-29 DIAGNOSIS — G9389 Other specified disorders of brain: Secondary | ICD-10-CM | POA: Diagnosis not present

## 2018-05-29 DIAGNOSIS — Q046 Congenital cerebral cysts: Secondary | ICD-10-CM | POA: Diagnosis not present

## 2018-05-29 DIAGNOSIS — R51 Headache: Secondary | ICD-10-CM | POA: Diagnosis not present

## 2018-05-29 DIAGNOSIS — K219 Gastro-esophageal reflux disease without esophagitis: Secondary | ICD-10-CM | POA: Diagnosis present

## 2018-05-29 DIAGNOSIS — S0181XA Laceration without foreign body of other part of head, initial encounter: Secondary | ICD-10-CM | POA: Diagnosis not present

## 2018-05-29 DIAGNOSIS — R404 Transient alteration of awareness: Secondary | ICD-10-CM | POA: Diagnosis not present

## 2018-05-29 DIAGNOSIS — G40909 Epilepsy, unspecified, not intractable, without status epilepticus: Secondary | ICD-10-CM | POA: Diagnosis not present

## 2018-05-29 DIAGNOSIS — H538 Other visual disturbances: Secondary | ICD-10-CM | POA: Diagnosis not present

## 2018-05-29 DIAGNOSIS — Z8049 Family history of malignant neoplasm of other genital organs: Secondary | ICD-10-CM | POA: Insufficient documentation

## 2018-05-29 DIAGNOSIS — H53462 Homonymous bilateral field defects, left side: Secondary | ICD-10-CM | POA: Diagnosis not present

## 2018-05-29 DIAGNOSIS — R410 Disorientation, unspecified: Secondary | ICD-10-CM | POA: Diagnosis not present

## 2018-05-29 DIAGNOSIS — Z8673 Personal history of transient ischemic attack (TIA), and cerebral infarction without residual deficits: Secondary | ICD-10-CM | POA: Diagnosis not present

## 2018-05-29 DIAGNOSIS — Z8249 Family history of ischemic heart disease and other diseases of the circulatory system: Secondary | ICD-10-CM | POA: Insufficient documentation

## 2018-05-29 DIAGNOSIS — Z7982 Long term (current) use of aspirin: Secondary | ICD-10-CM | POA: Insufficient documentation

## 2018-05-29 DIAGNOSIS — G8929 Other chronic pain: Secondary | ICD-10-CM | POA: Diagnosis not present

## 2018-05-29 DIAGNOSIS — R0902 Hypoxemia: Secondary | ICD-10-CM | POA: Diagnosis not present

## 2018-05-29 DIAGNOSIS — W19XXXA Unspecified fall, initial encounter: Secondary | ICD-10-CM | POA: Diagnosis not present

## 2018-05-29 DIAGNOSIS — A4151 Sepsis due to Escherichia coli [E. coli]: Secondary | ICD-10-CM | POA: Diagnosis not present

## 2018-05-29 DIAGNOSIS — Z8719 Personal history of other diseases of the digestive system: Secondary | ICD-10-CM | POA: Insufficient documentation

## 2018-05-29 DIAGNOSIS — E46 Unspecified protein-calorie malnutrition: Secondary | ICD-10-CM | POA: Insufficient documentation

## 2018-05-29 DIAGNOSIS — M549 Dorsalgia, unspecified: Secondary | ICD-10-CM | POA: Insufficient documentation

## 2018-05-29 DIAGNOSIS — Z79899 Other long term (current) drug therapy: Secondary | ICD-10-CM | POA: Insufficient documentation

## 2018-05-29 DIAGNOSIS — R531 Weakness: Secondary | ICD-10-CM | POA: Diagnosis not present

## 2018-05-29 DIAGNOSIS — Z802 Family history of malignant neoplasm of other respiratory and intrathoracic organs: Secondary | ICD-10-CM | POA: Insufficient documentation

## 2018-05-29 DIAGNOSIS — J449 Chronic obstructive pulmonary disease, unspecified: Secondary | ICD-10-CM | POA: Diagnosis not present

## 2018-05-29 DIAGNOSIS — Z8711 Personal history of peptic ulcer disease: Secondary | ICD-10-CM | POA: Insufficient documentation

## 2018-05-29 DIAGNOSIS — S0990XA Unspecified injury of head, initial encounter: Secondary | ICD-10-CM | POA: Diagnosis not present

## 2018-05-29 DIAGNOSIS — S299XXA Unspecified injury of thorax, initial encounter: Secondary | ICD-10-CM | POA: Diagnosis not present

## 2018-05-29 DIAGNOSIS — Z800 Family history of malignant neoplasm of digestive organs: Secondary | ICD-10-CM | POA: Insufficient documentation

## 2018-05-29 DIAGNOSIS — Z87891 Personal history of nicotine dependence: Secondary | ICD-10-CM | POA: Insufficient documentation

## 2018-05-29 DIAGNOSIS — Z86718 Personal history of other venous thrombosis and embolism: Secondary | ICD-10-CM | POA: Diagnosis not present

## 2018-05-29 DIAGNOSIS — Z809 Family history of malignant neoplasm, unspecified: Secondary | ICD-10-CM | POA: Insufficient documentation

## 2018-05-29 LAB — COMPREHENSIVE METABOLIC PANEL
ALBUMIN: 4.1 g/dL (ref 3.5–5.0)
ALT: 21 U/L (ref 0–44)
ANION GAP: 10 (ref 5–15)
AST: 30 U/L (ref 15–41)
Alkaline Phosphatase: 55 U/L (ref 38–126)
BILIRUBIN TOTAL: 1.1 mg/dL (ref 0.3–1.2)
BUN: 21 mg/dL — ABNORMAL HIGH (ref 6–20)
CO2: 23 mmol/L (ref 22–32)
Calcium: 9.1 mg/dL (ref 8.9–10.3)
Chloride: 104 mmol/L (ref 98–111)
Creatinine, Ser: 0.96 mg/dL (ref 0.44–1.00)
GFR calc Af Amer: >60 mL/min (ref >60)
Glucose, Bld: 128 mg/dL — ABNORMAL HIGH (ref 70–99)
POTASSIUM: 4.4 mmol/L (ref 3.5–5.1)
Sodium: 137 mmol/L (ref 135–145)
TOTAL PROTEIN: 6.8 g/dL (ref 6.5–8.1)

## 2018-05-29 LAB — URINALYSIS, ROUTINE W REFLEX MICROSCOPIC
Bilirubin Urine: NEGATIVE
GLUCOSE, UA: NEGATIVE mg/dL
KETONES UR: 5 mg/dL — AB
Leukocytes, UA: NEGATIVE
Nitrite: POSITIVE — AB
Protein, ur: NEGATIVE mg/dL
SPECIFIC GRAVITY, URINE: 1.02 (ref 1.005–1.030)
pH: 6 (ref 5.0–8.0)

## 2018-05-29 LAB — CBC WITH DIFFERENTIAL/PLATELET
ABS IMMATURE GRANULOCYTES: 0.04 10*3/uL (ref 0.00–0.07)
BASOS PCT: 0 %
Basophils Absolute: 0 10*3/uL (ref 0.0–0.1)
EOS ABS: 0 10*3/uL (ref 0.0–0.5)
Eosinophils Relative: 0 %
HCT: 44.3 % (ref 36.0–46.0)
Hemoglobin: 14.4 g/dL (ref 12.0–15.0)
Immature Granulocytes: 0 %
Lymphocytes Relative: 10 %
Lymphs Abs: 1 10*3/uL (ref 0.7–4.0)
MCH: 33.2 pg (ref 26.0–34.0)
MCHC: 32.5 g/dL (ref 30.0–36.0)
MCV: 102.1 fL — AB (ref 80.0–100.0)
MONO ABS: 0.4 10*3/uL (ref 0.1–1.0)
MONOS PCT: 4 %
Neutro Abs: 8.2 10*3/uL — ABNORMAL HIGH (ref 1.7–7.7)
Neutrophils Relative %: 86 %
Platelets: 129 10*3/uL — ABNORMAL LOW (ref 150–400)
RBC: 4.34 MIL/uL (ref 3.87–5.11)
RDW: 12.7 % (ref 11.5–15.5)
WBC: 9.7 10*3/uL (ref 4.0–10.5)
nRBC: 0 % (ref 0.0–0.2)

## 2018-05-29 LAB — LIPASE, BLOOD: Lipase: 26 U/L (ref 11–51)

## 2018-05-29 LAB — I-STAT VENOUS BLOOD GAS, ED
Acid-Base Excess: 3 mmol/L — ABNORMAL HIGH (ref 0.0–2.0)
BICARBONATE: 27.5 mmol/L (ref 20.0–28.0)
O2 Saturation: 67 %
PO2 VEN: 34 mmHg (ref 32.0–45.0)
TCO2: 29 mmol/L (ref 22–32)
pCO2, Ven: 40.9 mmHg — ABNORMAL LOW (ref 44.0–60.0)
pH, Ven: 7.436 — ABNORMAL HIGH (ref 7.250–7.430)

## 2018-05-29 LAB — MAGNESIUM: Magnesium: 2.1 mg/dL (ref 1.7–2.4)

## 2018-05-29 LAB — AMMONIA: Ammonia: 37 umol/L — ABNORMAL HIGH (ref 9–35)

## 2018-05-29 LAB — PROTIME-INR
INR: 1.1
PROTHROMBIN TIME: 14.1 s (ref 11.4–15.2)

## 2018-05-29 LAB — PHENYTOIN LEVEL, TOTAL: Phenytoin Lvl: <2.5 ug/mL — ABNORMAL LOW (ref 10.0–20.0)

## 2018-05-29 LAB — I-STAT CHEM 8, ED
BUN: 28 mg/dL — ABNORMAL HIGH (ref 6–20)
CALCIUM ION: 1.03 mmol/L — AB (ref 1.15–1.40)
Chloride: 105 mmol/L (ref 98–111)
Creatinine, Ser: 0.8 mg/dL (ref 0.44–1.00)
Glucose, Bld: 126 mg/dL — ABNORMAL HIGH (ref 70–99)
HCT: 44 % (ref 36.0–46.0)
Hemoglobin: 15 g/dL (ref 12.0–15.0)
Potassium: 4.3 mmol/L (ref 3.5–5.1)
SODIUM: 138 mmol/L (ref 135–145)
TCO2: 27 mmol/L (ref 22–32)

## 2018-05-29 LAB — I-STAT TROPONIN, ED: TROPONIN I, POC: 0.02 ng/mL (ref 0.00–0.08)

## 2018-05-29 LAB — I-STAT CG4 LACTIC ACID, ED
LACTIC ACID, VENOUS: 2.77 mmol/L — AB (ref 0.5–1.9)
Lactic Acid, Venous: 2.27 mmol/L (ref 0.5–1.9)

## 2018-05-29 LAB — PHOSPHORUS: PHOSPHORUS: 3.5 mg/dL (ref 2.5–4.6)

## 2018-05-29 LAB — ACETAMINOPHEN LEVEL: Acetaminophen (Tylenol), Serum: <10 ug/mL — ABNORMAL LOW (ref 10–30)

## 2018-05-29 LAB — CK: CK TOTAL: 144 U/L (ref 38–234)

## 2018-05-29 MED ORDER — LACOSAMIDE 200 MG PO TABS
200.0000 mg | ORAL_TABLET | Freq: Two times a day (BID) | ORAL | Status: DC
  Administered 2018-05-30 – 2018-05-31 (×4): 200 mg via ORAL
  Filled 2018-05-29 (×4): qty 1

## 2018-05-29 MED ORDER — SODIUM CHLORIDE 0.9 % IV SOLN
2.0000 g | Freq: Once | INTRAVENOUS | Status: AC
  Administered 2018-05-29: 2 g via INTRAVENOUS
  Filled 2018-05-29: qty 2

## 2018-05-29 MED ORDER — SODIUM CHLORIDE 0.9 % IV SOLN
1000.0000 mL | INTRAVENOUS | Status: DC
  Administered 2018-05-29: 1000 mL via INTRAVENOUS

## 2018-05-29 MED ORDER — SODIUM CHLORIDE 0.9 % IV SOLN
1250.0000 mg | Freq: Once | INTRAVENOUS | Status: AC
  Administered 2018-05-29: 1250 mg via INTRAVENOUS
  Filled 2018-05-29: qty 25

## 2018-05-29 MED ORDER — SODIUM CHLORIDE 0.9 % IV SOLN
200.0000 mg | Freq: Once | INTRAVENOUS | Status: AC
  Administered 2018-05-29: 200 mg via INTRAVENOUS
  Filled 2018-05-29: qty 20

## 2018-05-29 MED ORDER — ACETAMINOPHEN 650 MG RE SUPP: 650.0000 mg | Freq: Four times a day (QID) | RECTAL | Status: DC | PRN

## 2018-05-29 MED ORDER — VANCOMYCIN HCL IN DEXTROSE 1-5 GM/200ML-% IV SOLN
1000.0000 mg | Freq: Once | INTRAVENOUS | Status: AC
  Administered 2018-05-29: 1000 mg via INTRAVENOUS
  Filled 2018-05-29: qty 200

## 2018-05-29 MED ORDER — METRONIDAZOLE IN NACL 5-0.79 MG/ML-% IV SOLN
500.0000 mg | Freq: Three times a day (TID) | INTRAVENOUS | Status: DC
  Administered 2018-05-29: 500 mg via INTRAVENOUS
  Filled 2018-05-29: qty 100

## 2018-05-29 MED ORDER — VITAMIN B-12 1000 MCG PO TABS
1000.0000 ug | ORAL_TABLET | Freq: Every day | ORAL | Status: DC
  Administered 2018-05-30 – 2018-05-31 (×2): 1000 ug via ORAL
  Filled 2018-05-29 (×3): qty 1

## 2018-05-29 MED ORDER — LORAZEPAM 2 MG/ML IJ SOLN
1.0000 mg | Freq: Once | INTRAMUSCULAR | Status: AC
  Administered 2018-05-29: 1 mg via INTRAVENOUS
  Filled 2018-05-29: qty 1

## 2018-05-29 MED ORDER — ONDANSETRON HCL 4 MG PO TABS: 4.0000 mg | ORAL_TABLET | Freq: Four times a day (QID) | ORAL | Status: DC | PRN

## 2018-05-29 MED ORDER — GABAPENTIN 300 MG PO CAPS
300.0000 mg | ORAL_CAPSULE | Freq: Two times a day (BID) | ORAL | Status: DC
  Administered 2018-05-30 – 2018-05-31 (×4): 300 mg via ORAL
  Filled 2018-05-29 (×5): qty 1

## 2018-05-29 MED ORDER — SODIUM CHLORIDE 0.9 % IV BOLUS
1000.0000 mL | Freq: Once | INTRAVENOUS | Status: AC
  Administered 2018-05-29: 1000 mL via INTRAVENOUS

## 2018-05-29 MED ORDER — SODIUM CHLORIDE 0.9 % IV SOLN
1.0000 g | INTRAVENOUS | Status: DC
  Administered 2018-05-30 – 2018-06-01 (×3): 1 g via INTRAVENOUS
  Filled 2018-05-29 (×3): qty 10

## 2018-05-29 MED ORDER — SODIUM CHLORIDE 0.9% FLUSH
3.0000 mL | Freq: Two times a day (BID) | INTRAVENOUS | Status: DC
  Administered 2018-05-30 – 2018-05-31 (×5): 3 mL via INTRAVENOUS

## 2018-05-29 MED ORDER — LORAZEPAM 2 MG/ML IJ SOLN
1.0000 mg | Freq: Once | INTRAMUSCULAR | Status: AC
  Administered 2018-05-29: 1 mg via INTRAVENOUS

## 2018-05-29 MED ORDER — LORAZEPAM 2 MG/ML IJ SOLN
INTRAMUSCULAR | Status: AC
  Filled 2018-05-29: qty 1

## 2018-05-29 MED ORDER — SODIUM CHLORIDE 0.9 % IV SOLN
INTRAVENOUS | Status: DC
  Administered 2018-05-30: 01:00:00 via INTRAVENOUS

## 2018-05-29 MED ORDER — PHENYTOIN SODIUM EXTENDED 30 MG PO CAPS
150.0000 mg | ORAL_CAPSULE | Freq: Two times a day (BID) | ORAL | Status: DC
  Administered 2018-05-30 – 2018-05-31 (×4): 150 mg via ORAL
  Filled 2018-05-29 (×6): qty 5

## 2018-05-29 MED ORDER — SENNOSIDES-DOCUSATE SODIUM 8.6-50 MG PO TABS: 1.0000 | ORAL_TABLET | Freq: Every evening | ORAL | Status: DC | PRN

## 2018-05-29 MED ORDER — ALBUTEROL SULFATE (2.5 MG/3ML) 0.083% IN NEBU: 2.5000 mg | INHALATION_SOLUTION | Freq: Four times a day (QID) | RESPIRATORY_TRACT | Status: DC | PRN

## 2018-05-29 MED ORDER — ACETAMINOPHEN 325 MG PO TABS
650.0000 mg | ORAL_TABLET | Freq: Four times a day (QID) | ORAL | Status: DC | PRN
  Administered 2018-05-30: 650 mg via ORAL
  Filled 2018-05-29: qty 2

## 2018-05-29 MED ORDER — ONDANSETRON HCL 4 MG/2ML IJ SOLN: 4.0000 mg | Freq: Four times a day (QID) | INTRAMUSCULAR | Status: DC | PRN

## 2018-05-29 NOTE — H&P (Signed)
History and Physical    [name] LZJ:[id number] DOB: 1956/01/27 DOA: 05/29/2018  PCP: [name], MD   Patient coming from: Home   Chief Complaint: Seizures, right forehead laceration   HPI: [name] is a 62 y.o. female with medical history significant for intracranial hemorrhage with subsequent seizure disorder, COPD, and neuropathy, now presenting to the emergency department with seizures.  Patient was recently admitted with Dilantin toxicity, Dilantin was held pending repeat level.  She was in her usual state when talking on the phone with family on 05/28/2018 and her son reports that she was active on Facebook at noon on 05/29/2018, but when family was unable to reach her by phone in the evening, they went to her house where she was found to be poorly responsive and bleeding from a laceration over her right eye.  She had been incontinent of urine which family reports was malodorous.  She had not been complaining of anything yesterday when I spoke with her.  She has been continuing to hold Dilantin pending repeat level.  ED Course: Upon arrival to the ED, patient is found to be febrile to 38.6 C, saturating well on room air, slightly tachypneic, and with vitals otherwise stable.  EKG features a sinus rhythm and chest x-ray is negative for acute findings.  Noncontrast head CT features extensive encephalomalacia that is stable, and no acute findings identified.  Chemistry panel is unremarkable and CBC features a mild stable thrombocytopenia and macrocytosis without anemia.  Lactic acid was elevated to 2.77 and troponin negative.  Urinalysis is nitrite positive.  Phenytoin level is undetectable.  Neurology was consulted by the ED physician and patient was loaded with Vimpat and phenytoin in the ED.  She had generalized seizures in the ED that were aborted with Ativan.  Blood and urine cultures were ordered, 1 L of normal saline given, and the patient was treated with vancomycin, cefepime,  and Flagyl in the ED.  She has become more awake and is beginning to answer basic questions.  She will be admitted for ongoing evaluation and management of seizures secondary to subtherapeutic Dilantin and sepsis secondary to UTI.  Review of Systems:  All other systems reviewed and apart from HPI, are negative.  Past Medical History:  Diagnosis Date  . Asthma   . Cerebral hemorrhage ([place]) 1989  . Chronic back pain   . COPD (chronic obstructive pulmonary disease) ([place])   . DDD (degenerative disc disease) 06/12/2013  . DVT (deep venous thrombosis) ([place])    "she's had several since 1990"  . GERD (gastroesophageal reflux disease)   . Headache    "usually around time when she's had a seizure"  . History of blood transfusion    "when she was a baby"  . History of stomach ulcers   . Kidney stones   . Neuropathy   . Seizures ([place])    "because of her brain surgery"  . Stroke [facility]) [phone]   family denies residual on 11/09/2014  . Tunnel vision    "since brain OR"    Past Surgical History:  Procedure Laterality Date  . [place]   craniotomy with hematoma evacuation  . CESAREAN SECTION  1979; 1987; 1989  . HEMORRHOID SURGERY    . TUBAL LIGATION  1990's     reports that she quit smoking about 7 months ago. Her smoking use included cigarettes. She has a 10.00 pack-year smoking history. She has never used smokeless tobacco. She reports that she  does not drink alcohol or use drugs.  Allergies  Allergen Reactions  . Zonisamide Other (See Comments)    Numbness and tingling over whole body  . Codeine Nausea And Vomiting  . Keppra [Levetiracetam] Other (See Comments)    Causes seizures.   . Pregabalin Nausea And Vomiting  . Olive Oil Rash    Family History  Problem Relation Age of Onset  . Heart attack Mother        4 MIs, started in her 3s, also vaginal cancer and colon cancer  . Cancer Mother   . Heart attack Father        Died suddenly age 48 - autopsy revealed heart  attack her patient  . Cancer Brother        Sinus cancer     Prior to Admission medications   Medication Sig Start Date End Date Taking? Authorizing Provider  acetaminophen (TYLENOL) 325 MG tablet Take 325 mg by mouth daily as needed for headache (pain).     [provider]  albuterol (PROVENTIL HFA;VENTOLIN HFA) 108 (90 Base) MCG/ACT inhaler Inhale 2 puffs into the lungs every 6 (six) hours as needed for wheezing or shortness of breath.    [provider]  gabapentin (NEURONTIN) 300 MG capsule Take 1 capsule (300 mg total) by mouth 2 (two) times daily. 05/26/18   Geradine Girt, DO  lacosamide (VIMPAT) 200 MG TABS tablet Take 1 tablet (200 mg total) by mouth 2 (two) times daily. 04/07/18   [name], MD  vitamin B-12 (CYANOCOBALAMIN) 250 MCG tablet Take 1 tablet (250 mcg total) by mouth daily. 05/27/18   Geradine Girt, DO    Physical Exam: Vitals:   05/29/18 2230 05/29/18 2300 05/29/18 2315 05/29/18 2330  BP: 132/65 122/64 128/62 124/68  Pulse: 82 85 84 92  Resp: (!) 23 (!) 23 (!) 25 20  Temp:      TempSrc:      SpO2: 100% 100% 99% 100%  Weight:      Height:        Constitutional: NAD, frail  Eyes: PERTLA, lids and conjunctivae normal ENMT: Mucous membranes are moist. Posterior pharynx clear of any exudate or lesions.   Neck: normal, supple, no masses, no thyromegaly Respiratory: clear to auscultation bilaterally, no wheezing, no crackles. Normal respiratory effort.    Cardiovascular: S1 & S2 heard, regular rate and rhythm. No extremity edema.  Abdomen: No distension, no tenderness, soft. Bowel sounds normal.  Musculoskeletal: no clubbing / cyanosis. No joint deformity upper and lower extremities.    Skin: no significant rashes, lesions, ulcers. Warm, dry, well-perfused. Neurologic: No facial asymmetry. Sensation to light touch intact. Moving all extremities.  Psychiatric: Somnolent, easily roused. Cooperative.    Labs on Admission: I have personally  reviewed following labs and imaging studies  CBC: Recent Labs  Lab 05/23/18 1647 05/29/18 2129 05/29/18 2133  WBC 5.3 9.7  --   NEUTROABS  --  8.2*  --   HGB 13.7 14.4 15.0  HCT 41.8 44.3 44.0  MCV 101.2* 102.1*  --   PLT 147* 129*  --    Basic Metabolic Panel: Recent Labs  Lab 05/23/18 1647 05/29/18 2129 05/29/18 2133  NA 140 137 138  K 3.9 4.4 4.3  CL 109 104 105  CO2 23 23  --   GLUCOSE 88 128* 126*  BUN 11 21* 28*  CREATININE 0.70 0.96 0.80  CALCIUM 9.1 9.1  --   MG  --  2.1  --  PHOS  --  3.5  --    GFR: Estimated Creatinine Clearance: 84.9 mL/min (by C-G formula based on SCr of 0.8 mg/dL). Liver Function Tests: Recent Labs  Lab 05/23/18 2254 05/29/18 2129  AST  --  30  ALT  --  21  ALKPHOS  --  55  BILITOT  --  1.1  PROT  --  6.8  ALBUMIN 3.6 4.1   Recent Labs  Lab 05/29/18 2129  LIPASE 26   Recent Labs  Lab 05/29/18 2129  AMMONIA 37*   Coagulation Profile: Recent Labs  Lab 05/29/18 2129  INR 1.10   Cardiac Enzymes: Recent Labs  Lab 05/23/18 2254 05/24/18 0556 05/24/18 0846 05/29/18 2129  CKTOTAL  --   --   --  144  TROPONINI <0.03 <0.03 <0.03  --    BNP (last 3 results) No results for input(s): PROBNP in the last 8760 hours. HbA1C: No results for input(s): HGBA1C in the last 72 hours. CBG: No results for input(s): GLUCAP in the last 168 hours. Lipid Profile: No results for input(s): CHOL, HDL, LDLCALC, TRIG, CHOLHDL, LDLDIRECT in the last 72 hours. Thyroid Function Tests: No results for input(s): TSH, T4TOTAL, FREET4, T3FREE, THYROIDAB in the last 72 hours. Anemia Panel: No results for input(s): VITAMINB12, FOLATE, FERRITIN, TIBC, IRON, RETICCTPCT in the last 72 hours. Urine analysis:    Component Value Date/Time   COLORURINE YELLOW 05/29/2018 2026   APPEARANCEUR CLEAR 05/29/2018 2026   LABSPEC 1.020 05/29/2018 2026   PHURINE 6.0 05/29/2018 2026   GLUCOSEU NEGATIVE 05/29/2018 2026   HGBUR SMALL (A) 05/29/2018 2026    BILIRUBINUR NEGATIVE 05/29/2018 2026   KETONESUR 5 (A) 05/29/2018 2026   PROTEINUR NEGATIVE 05/29/2018 2026   UROBILINOGEN 0.2 01/17/2015 2350   NITRITE POSITIVE (A) 05/29/2018 2026   LEUKOCYTESUR NEGATIVE 05/29/2018 2026   Sepsis Labs: @LABRCNTIP (procalcitonin:4,lacticidven:4) )No results found for this or any previous visit (from the past 240 hour(s)).   Radiological Exams on Admission: Ct Head Wo Contrast  Result Date: 05/29/2018 CLINICAL DATA:  Seizures with apparent fall EXAM: CT HEAD WITHOUT CONTRAST CT CERVICAL SPINE WITHOUT CONTRAST TECHNIQUE: Multidetector CT imaging of the head and cervical spine was performed following the standard protocol without intravenous contrast. Multiplanar CT image reconstructions of the cervical spine were also generated. COMPARISON:  Cervical spine CT August 05, 2017; brain MRI March 20, 2018; cervical spine MRI March 20, 2018; head CT May 24, 2018 FINDINGS: CT HEAD FINDINGS Brain: The ventricles are normal in size and configuration. There is extensive encephalomalacia in the right parieto-occipital region, stable. This encephalomalacia connects to the atrium of the lateral ventricle on the right with enlargement of this portion of the right lateral ventricle, stable. There is no new encephalomalacia. There is no evident mass, hemorrhage, extra-axial fluid collection, or midline shift. Brain parenchyma elsewhere appears unremarkable. No acute infarct is demonstrable. Vascular: No hyperdense vessel. There are foci of calcification in each distal vertebral artery and carotid siphon region. Skull: Patient is status post right-sided craniotomy defects, stable. No new bony defects are evident. Sinuses/Orbits: There is mucosal thickening and opacification in several ethmoid air cells. There is opacification of much of the right maxillary antrum with an air-fluid level in this area. There is mucosal thickening in the inferior right frontal sinus. Orbits appear  symmetric bilaterally. Other: Mastoid air cells on the right are clear. There is mastoid disease on the left, stable. CT CERVICAL SPINE FINDINGS Alignment: There is no appreciable spondylolisthesis. Skull base and vertebrae:  Skull base and craniocervical junction regions appear normal. There is no appreciable fracture. There are no blastic or lytic bone lesions. Soft tissues and spinal canal: Prevertebral soft tissues and predental space regions are normal. There is no paraspinous lesion. There is no evident cord or canal hematoma. Disc levels: There is moderate disc space narrowing at C5-6, C6-7, and C7-T1. Anterior osteophytes are noted at C5, C6, and C7. There is facet hypertrophy at several levels bilaterally. There is exit foraminal narrowing due to bony hypertrophy at C5-6 on the right with impression on the exiting nerve root at this level due to bony hypertrophy. There is no frank disc extrusion or high-grade stenosis. Upper chest: Visualized upper lung regions are clear. Other: There is calcification in each carotid artery. IMPRESSION: CT head: Extensive encephalomalacia in the right parieto-occipital region with involvement of the atrium of the right lateral ventricle, stable. No new encephalomalacia. No acute infarct. There is no mass or hemorrhage. Postoperative changes noted in the right calvarium, stable. Foci of arterial vascular calcification noted. Areas of paranasal sinus disease at multiple sites, most severe in the right maxillary antrum. 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 CT cervical spine: No evident fracture or spondylolisthesis. Osteoarthritic change at multiple levels with impression on the exiting nerve root on the right at C5-6 due to bony hypertrophy. No frank disc extrusion or stenosis. There are foci of carotid artery calcification bilaterally. Fluid in the upper esophagus may be indicative of gastroesophageal reflux. Electronically Signed   By: Lowella Grip III M.D.   On:  05/29/2018 21:38   Ct Cervical Spine Wo Contrast  Result Date: 05/29/2018 CLINICAL DATA:  Seizures with apparent fall EXAM: CT HEAD WITHOUT CONTRAST CT CERVICAL SPINE WITHOUT CONTRAST TECHNIQUE: Multidetector CT imaging of the head and cervical spine was performed following the standard protocol without intravenous contrast. Multiplanar CT image reconstructions of the cervical spine were also generated. COMPARISON:  Cervical spine CT August 05, 2017; brain MRI March 20, 2018; cervical spine MRI March 20, 2018; head CT May 24, 2018 FINDINGS: CT HEAD FINDINGS Brain: The ventricles are normal in size and configuration. There is extensive encephalomalacia in the right parieto-occipital region, stable. This encephalomalacia connects to the atrium of the lateral ventricle on the right with enlargement of this portion of the right lateral ventricle, stable. There is no new encephalomalacia. There is no evident mass, hemorrhage, extra-axial fluid collection, or midline shift. Brain parenchyma elsewhere appears unremarkable. No acute infarct is demonstrable. Vascular: No hyperdense vessel. There are foci of calcification in each distal vertebral artery and carotid siphon region. Skull: Patient is status post right-sided craniotomy defects, stable. No new bony defects are evident. Sinuses/Orbits: There is mucosal thickening and opacification in several ethmoid air cells. There is opacification of much of the right maxillary antrum with an air-fluid level in this area. There is mucosal thickening in the inferior right frontal sinus. Orbits appear symmetric bilaterally. Other: Mastoid air cells on the right are clear. There is mastoid disease on the left, stable. CT CERVICAL SPINE FINDINGS Alignment: There is no appreciable spondylolisthesis. Skull base and vertebrae: Skull base and craniocervical junction regions appear normal. There is no appreciable fracture. There are no blastic or lytic bone lesions. Soft  tissues and spinal canal: Prevertebral soft tissues and predental space regions are normal. There is no paraspinous lesion. There is no evident cord or canal hematoma. Disc levels: There is moderate disc space narrowing at C5-6, C6-7, and C7-T1. Anterior osteophytes are noted at C5,  C6, and C7. There is facet hypertrophy at several levels bilaterally. There is exit foraminal narrowing due to bony hypertrophy at C5-6 on the right with impression on the exiting nerve root at this level due to bony hypertrophy. There is no frank disc extrusion or high-grade stenosis. Upper chest: Visualized upper lung regions are clear. Other: There is calcification in each carotid artery. IMPRESSION: CT head: Extensive encephalomalacia in the right parieto-occipital region with involvement of the atrium of the right lateral ventricle, stable. No new encephalomalacia. No acute infarct. There is no mass or hemorrhage. Postoperative changes noted in the right calvarium, stable. Foci of arterial vascular calcification noted. Areas of paranasal sinus disease at multiple sites, most severe in the right maxillary antrum. 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 CT cervical spine: No evident fracture or spondylolisthesis. Osteoarthritic change at multiple levels with impression on the exiting nerve root on the right at C5-6 due to bony hypertrophy. No frank disc extrusion or stenosis. There are foci of carotid artery calcification bilaterally. Fluid in the upper esophagus may be indicative of gastroesophageal reflux. Electronically Signed   By: Lowella Grip III M.D.   On: 05/29/2018 21:38   Dg Chest Port 1 View  Result Date: 05/29/2018 CLINICAL DATA:  Fever EXAM: PORTABLE CHEST 1 VIEW COMPARISON:  None. FINDINGS: The heart size and mediastinal contours are within normal limits. Both lungs are clear. The visualized skeletal structures are unremarkable. IMPRESSION: No active disease. Electronically Signed   By: Ulyses Jarred M.D.    On: 05/29/2018 21:47    EKG: Independently reviewed. Sinus rhythm.   Assessment/Plan   1. Seizures  - Pt has hx of right parietal/occiptal AVM with hemmorrhage and subsequent seizure disorder, recently holding Dilantin after admission with toxicity, now presenting with seizures  - Head CT is stable, no acute findings  - Neurology consulting and much appreciated  - Generalized seizure in ED aborted with Ativan and she was loaded with Vimpat and fosphenytoin - Dilantin level now undetectable  - Continue seizure precautions, Vimpat BID, resume Dilantin with dose-reduction in light of recent toxicity, routine EEG    2. Sepsis secondary to UTI  - Presents with seizure, family reports malodorous urine, found to be febrile with elevated lactate, and UA is nitrite-positive with suprapubic tenderness  - Blood and urine cultures were ordered and she was given empiric vancomycin, cefepime, and Flagyl in ED  - Continue empiric antibiotics with Rocephin, follow cultures and clinical course   3. COPD  - No cough, wheeze, or SOB  - Continue as-needed albuterol   4. Neuropathy  - Continue Neurontin and B12 supplementation    DVT prophylaxis: SCD's  Code Status: Full  Family Communication: Son and daughter updated at bedside Consults called: Neurology  Admission status: Observation     Vianne Bulls, MD [facility] Pager [phone]  If 7PM-7AM, please contact night-coverage [email or web address] Password [facility]  05/29/2018, 11:49 PM

## 2018-05-29 NOTE — ED Provider Notes (Addendum)
[facility] EMERGENCY DEPARTMENT Provider Note   CSN: [id number] Arrival date & time: 05/29/18  1951     History   Chief Complaint Chief Complaint  Patient presents with  . Seizures    HPI [name] is a 62 y.o. female.  HPI Patient has a history of focal seizures.  She was discharged from the hospital 10\23\2019 for chest pain evaluation.  Her Dilantin level was elevated.  Family members report they were told to hold the Dilantin and get an outpatient recheck on the level.  They report they tried the family doctor and the neurologist and neither agreed to do the lab citing that the other was indicated for follow-up testing.  Family member saw the patient yesterday.  They report she seemed to be at baseline.  Reportedly however during the evening there was a telephone call and she seemed slightly more weak.  They could not get in touch with her today so went to her home and found her lying in her bed with a big swelling on her forehead and blood all around her and on the floor.  They report that she also had urine around her.  She seemed confused and unwell.  They suspect that she might of had multiple seizures today.  They are not aware of any recent illness.  They report she did not have any complaints yesterday. Past Medical History:  Diagnosis Date  . Asthma   . Cerebral hemorrhage ([place]) 1989  . Chronic back pain   . COPD (chronic obstructive pulmonary disease) ([place])   . DDD (degenerative disc disease) 06/12/2013  . DVT (deep venous thrombosis) ([place])    "she's had several since 1990"  . GERD (gastroesophageal reflux disease)   . Headache    "usually around time when she's had a seizure"  . History of blood transfusion    "when she was a baby"  . History of stomach ulcers   . Kidney stones   . Neuropathy   . Seizures ([place])    "because of her brain surgery"  . Stroke [facility] [phone]   family denies residual on 11/09/2014  . Tunnel vision    "since brain  OR"    Patient Active Problem List   Diagnosis Date Noted  . Weakness of both lower extremities   . Generalized weakness 05/25/2018  . History of CVA (cerebrovascular accident)   . Pain   . Chronic back pain   . Neuropathy   . Orthostasis   . Tobacco abuse   . Left-sided weakness 03/19/2018  . Atypical chest pain 02/17/2017  . Localized swelling of lower extremity 02/17/2017  . GERD (gastroesophageal reflux disease) 02/17/2017  . Costochondritis 02/17/2017  . Seizures ([place]) 11/02/2015  . Gait disturbance 11/02/2015  . Right clavicle fracture 11/02/2015  . Infection of urinary tract 11/02/2015  . Chest pain 03/31/2015  . Acute on chronic respiratory failure with hypoxia ([place]) 03/31/2015  . SOB (shortness of breath) 03/31/2015  . Anxiety state 03/31/2015  . Malnutrition of moderate degree ([place]) 03/31/2015  . COPD with exacerbation ([place]) 03/30/2015  . Hypoxia 01/18/2015  . Chronic obstructive pulmonary disease with acute exacerbation ([place])   . Fever 01/17/2015  . Cough 01/17/2015  . Sepsis ([place]) 01/17/2015  . COPD (chronic obstructive pulmonary disease) ([place])   . Unintentional weight loss 01/11/2015  . Asthma 05/15/2014  . Seizure ([place]) 05/15/2014  . Protein calorie malnutrition ([place]) 05/15/2014  . DVT of leg (deep venous thrombosis) ([place]) 06/19/2013  .  DDD (degenerative disc disease) 06/12/2013  . Paresthesias 06/12/2013  . Tremor 06/09/2013  . Thrombocytopenia- chronic 06/29/2012  . Noncompliance with medication treatment due to underuse of medication 06/29/2012  . Stress-personal 06/29/2012  . Recurrent seizures ([place]) 06/29/2012  . Tobacco dependence 06/28/2012  . Skull fracture ([place]) 02/28/2012  . Dilantin toxicity 02/28/2012  . Seizure disorder ([place]) 02/28/2012  . History of Intracranial bleed 02/28/2012    Past Surgical History:  Procedure Laterality Date  . [place]   craniotomy with hematoma evacuation  . CESAREAN SECTION  1979; 1987; 1989  .  HEMORRHOID SURGERY    . TUBAL LIGATION  1990's     OB History   None      Home Medications    Prior to Admission medications   Medication Sig Start Date End Date Taking? Authorizing Provider  acetaminophen (TYLENOL) 325 MG tablet Take 325 mg by mouth daily as needed for headache (pain).     [provider]  albuterol (PROVENTIL HFA;VENTOLIN HFA) 108 (90 Base) MCG/ACT inhaler Inhale 2 puffs into the lungs every 6 (six) hours as needed for wheezing or shortness of breath.    [provider]  gabapentin (NEURONTIN) 300 MG capsule Take 1 capsule (300 mg total) by mouth 2 (two) times daily. 05/26/18   Geradine Girt, DO  lacosamide (VIMPAT) 200 MG TABS tablet Take 1 tablet (200 mg total) by mouth 2 (two) times daily. 04/07/18   [name], MD  vitamin B-12 (CYANOCOBALAMIN) 250 MCG tablet Take 1 tablet (250 mcg total) by mouth daily. 05/27/18   Geradine Girt, DO    Family History Family History  Problem Relation Age of Onset  . Heart attack Mother        4 MIs, started in her 75s, also vaginal cancer and colon cancer  . Cancer Mother   . Heart attack Father        Died suddenly age 19 - autopsy revealed heart attack her patient  . Cancer Brother        Sinus cancer    Social History Social History   Tobacco Use  . Smoking status: Former Smoker    Packs/day: 0.50    Years: 20.00    Pack years: 10.00    Types: Cigarettes    Last attempt to quit: 10/26/2017    Years since quitting: 0.5  . Smokeless tobacco: Never Used  . Tobacco comment: Previously smoked 2 ppd, down to 4 cigs/day  Substance Use Topics  . Alcohol use: No  . Drug use: No     Allergies   Zonisamide; Codeine; Keppra [levetiracetam]; Pregabalin; and Olive oil   Review of Systems Review of Systems 10 Systems reviewed and are negative for acute change except as noted in the HPI.   Physical Exam Updated Vital Signs BP 132/65   Pulse 82   Temp (!) 101.5 F (38.6 C) (Oral)    Resp (!) 23   Ht 5\' 8"  (1.727 m)   Wt 83.9 kg   SpO2 100%   BMI 28.13 kg/m   Physical Exam  Constitutional:  On first evaluation, patient is actively having a seizure.  She has tonic-clonic movement with foaming at the mouth.  Interventions immediately made.  HENT:  Hematoma above the right eyebrow.  Airway has small amount of saliva secretions in it.  Eyes:  Pupils symmetric about 3 mm with conjugate ocular movements.  Cardiovascular:  Tachycardic no gross rub murmur gallop.  Pulmonary/Chest:  Patient is breathing rapidly and deeply.  Breath sounds are symmetric.  No gross rhonchi or rale.  Abdominal: Soft. She exhibits no distension.  Musculoskeletal:  Some bruises to upper extremities that appear secondary to IV sticks.  No deformities of the lower extremities.  Good distal pulses 2+ and symmetric.  No peripheral edema.  Neurological:  Patient was first having active tonic-clonic seizure.  Once that this resolved she is obtunded.  She is breathing with regular fashion.  No gurgling in the airway.  She has post ictal appearance.  Skin: Skin is warm and dry.     ED Treatments / Results  Labs (all labs ordered are listed, but only abnormal results are displayed) Labs Reviewed  COMPREHENSIVE METABOLIC PANEL - Abnormal; Notable for the following components:      Result Value   Glucose, Bld 128 (*)    BUN 21 (*)    All other components within normal limits  CBC WITH DIFFERENTIAL/PLATELET - Abnormal; Notable for the following components:   MCV 102.1 (*)    Platelets 129 (*)    Neutro Abs 8.2 (*)    All other components within normal limits  AMMONIA - Abnormal; Notable for the following components:   Ammonia 37 (*)    All other components within normal limits  PHENYTOIN LEVEL, TOTAL - Abnormal; Notable for the following components:   Phenytoin Lvl <2.5 (*)    All other components within normal limits  ACETAMINOPHEN LEVEL - Abnormal; Notable for the following components:    Acetaminophen (Tylenol), Serum <10 (*)    All other components within normal limits  I-STAT CHEM 8, ED - Abnormal; Notable for the following components:   BUN 28 (*)    Glucose, Bld 126 (*)    Calcium, Ion 1.03 (*)    All other components within normal limits  I-STAT CG4 LACTIC ACID, ED - Abnormal; Notable for the following components:   Lactic Acid, Venous 2.77 (*)    All other components within normal limits  I-STAT VENOUS BLOOD GAS, ED - Abnormal; Notable for the following components:   pH, Ven 7.436 (*)    pCO2, Ven 40.9 (*)    Acid-Base Excess 3.0 (*)    All other components within normal limits  I-STAT CG4 LACTIC ACID, ED - Abnormal; Notable for the following components:   Lactic Acid, Venous 2.27 (*)    All other components within normal limits  CULTURE, BLOOD (ROUTINE X 2)  CULTURE, BLOOD (ROUTINE X 2)  URINE CULTURE  LIPASE, BLOOD  PROTIME-INR  CK  MAGNESIUM  PHOSPHORUS  URINALYSIS, ROUTINE W REFLEX MICROSCOPIC  BLOOD GAS, VENOUS  INFLUENZA PANEL BY PCR (TYPE A & B)  I-STAT TROPONIN, ED  I-STAT CG4 LACTIC ACID, ED    EKG EKG Interpretation  Date/Time:  Saturday May 29 2018 22:13:08 EDT Ventricular Rate:  86 PR Interval:    QRS Duration: 101 QT Interval:  357 QTC Calculation: 427 R Axis:   82 Text Interpretation:  Sinus rhythm Borderline right axis deviation normal, no change from previous Confirmed by Charlesetta Shanks [phone]) on 05/29/2018 11:07:53 PM   Radiology Ct Head Wo Contrast  Result Date: 05/29/2018 CLINICAL DATA:  Seizures with apparent fall EXAM: CT HEAD WITHOUT CONTRAST CT CERVICAL SPINE WITHOUT CONTRAST TECHNIQUE: Multidetector CT imaging of the head and cervical spine was performed following the standard protocol without intravenous contrast. Multiplanar CT image reconstructions of the cervical spine were also generated. COMPARISON:  Cervical spine CT August 05, 2017; brain  MRI March 20, 2018; cervical spine MRI March 20, 2018; head CT  May 24, 2018 FINDINGS: CT HEAD FINDINGS Brain: The ventricles are normal in size and configuration. There is extensive encephalomalacia in the right parieto-occipital region, stable. This encephalomalacia connects to the atrium of the lateral ventricle on the right with enlargement of this portion of the right lateral ventricle, stable. There is no new encephalomalacia. There is no evident mass, hemorrhage, extra-axial fluid collection, or midline shift. Brain parenchyma elsewhere appears unremarkable. No acute infarct is demonstrable. Vascular: No hyperdense vessel. There are foci of calcification in each distal vertebral artery and carotid siphon region. Skull: Patient is status post right-sided craniotomy defects, stable. No new bony defects are evident. Sinuses/Orbits: There is mucosal thickening and opacification in several ethmoid air cells. There is opacification of much of the right maxillary antrum with an air-fluid level in this area. There is mucosal thickening in the inferior right frontal sinus. Orbits appear symmetric bilaterally. Other: Mastoid air cells on the right are clear. There is mastoid disease on the left, stable. CT CERVICAL SPINE FINDINGS Alignment: There is no appreciable spondylolisthesis. Skull base and vertebrae: Skull base and craniocervical junction regions appear normal. There is no appreciable fracture. There are no blastic or lytic bone lesions. Soft tissues and spinal canal: Prevertebral soft tissues and predental space regions are normal. There is no paraspinous lesion. There is no evident cord or canal hematoma. Disc levels: There is moderate disc space narrowing at C5-6, C6-7, and C7-T1. Anterior osteophytes are noted at C5, C6, and C7. There is facet hypertrophy at several levels bilaterally. There is exit foraminal narrowing due to bony hypertrophy at C5-6 on the right with impression on the exiting nerve root at this level due to bony hypertrophy. There is no frank disc  extrusion or high-grade stenosis. Upper chest: Visualized upper lung regions are clear. Other: There is calcification in each carotid artery. IMPRESSION: CT head: Extensive encephalomalacia in the right parieto-occipital region with involvement of the atrium of the right lateral ventricle, stable. No new encephalomalacia. No acute infarct. There is no mass or hemorrhage. Postoperative changes noted in the right calvarium, stable. Foci of arterial vascular calcification noted. Areas of paranasal sinus disease at multiple sites, most severe in the right maxillary antrum. 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 CT cervical spine: No evident fracture or spondylolisthesis. Osteoarthritic change at multiple levels with impression on the exiting nerve root on the right at C5-6 due to bony hypertrophy. No frank disc extrusion or stenosis. There are foci of carotid artery calcification bilaterally. Fluid in the upper esophagus may be indicative of gastroesophageal reflux. Electronically Signed   By: Lowella Grip III M.D.   On: 05/29/2018 21:38   Ct Cervical Spine Wo Contrast  Result Date: 05/29/2018 CLINICAL DATA:  Seizures with apparent fall EXAM: CT HEAD WITHOUT CONTRAST CT CERVICAL SPINE WITHOUT CONTRAST TECHNIQUE: Multidetector CT imaging of the head and cervical spine was performed following the standard protocol without intravenous contrast. Multiplanar CT image reconstructions of the cervical spine were also generated. COMPARISON:  Cervical spine CT August 05, 2017; brain MRI March 20, 2018; cervical spine MRI March 20, 2018; head CT May 24, 2018 FINDINGS: CT HEAD FINDINGS Brain: The ventricles are normal in size and configuration. There is extensive encephalomalacia in the right parieto-occipital region, stable. This encephalomalacia connects to the atrium of the lateral ventricle on the right with enlargement of this portion of the right lateral ventricle, stable. There is no new  encephalomalacia. There is no evident mass, hemorrhage, extra-axial fluid collection, or midline shift. Brain parenchyma elsewhere appears unremarkable. No acute infarct is demonstrable. Vascular: No hyperdense vessel. There are foci of calcification in each distal vertebral artery and carotid siphon region. Skull: Patient is status post right-sided craniotomy defects, stable. No new bony defects are evident. Sinuses/Orbits: There is mucosal thickening and opacification in several ethmoid air cells. There is opacification of much of the right maxillary antrum with an air-fluid level in this area. There is mucosal thickening in the inferior right frontal sinus. Orbits appear symmetric bilaterally. Other: Mastoid air cells on the right are clear. There is mastoid disease on the left, stable. CT CERVICAL SPINE FINDINGS Alignment: There is no appreciable spondylolisthesis. Skull base and vertebrae: Skull base and craniocervical junction regions appear normal. There is no appreciable fracture. There are no blastic or lytic bone lesions. Soft tissues and spinal canal: Prevertebral soft tissues and predental space regions are normal. There is no paraspinous lesion. There is no evident cord or canal hematoma. Disc levels: There is moderate disc space narrowing at C5-6, C6-7, and C7-T1. Anterior osteophytes are noted at C5, C6, and C7. There is facet hypertrophy at several levels bilaterally. There is exit foraminal narrowing due to bony hypertrophy at C5-6 on the right with impression on the exiting nerve root at this level due to bony hypertrophy. There is no frank disc extrusion or high-grade stenosis. Upper chest: Visualized upper lung regions are clear. Other: There is calcification in each carotid artery. IMPRESSION: CT head: Extensive encephalomalacia in the right parieto-occipital region with involvement of the atrium of the right lateral ventricle, stable. No new encephalomalacia. No acute infarct. There is no mass  or hemorrhage. Postoperative changes noted in the right calvarium, stable. Foci of arterial vascular calcification noted. Areas of paranasal sinus disease at multiple sites, most severe in the right maxillary antrum. 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 CT cervical spine: No evident fracture or spondylolisthesis. Osteoarthritic change at multiple levels with impression on the exiting nerve root on the right at C5-6 due to bony hypertrophy. No frank disc extrusion or stenosis. There are foci of carotid artery calcification bilaterally. Fluid in the upper esophagus may be indicative of gastroesophageal reflux. Electronically Signed   By: Lowella Grip III M.D.   On: 05/29/2018 21:38   Dg Chest Port 1 View  Result Date: 05/29/2018 CLINICAL DATA:  Fever EXAM: PORTABLE CHEST 1 VIEW COMPARISON:  None. FINDINGS: The heart size and mediastinal contours are within normal limits. Both lungs are clear. The visualized skeletal structures are unremarkable. IMPRESSION: No active disease. Electronically Signed   By: Ulyses Jarred M.D.   On: 05/29/2018 21:47    Procedures Procedures (including critical care time) CRITICAL CARE Performed by: Charlesetta Shanks   Total critical care time:30 minutes  Critical care time was exclusive of separately billable procedures and treating other patients.  Critical care was necessary to treat or prevent imminent or life-threatening deterioration.  Critical care was time spent personally by me on the following activities: development of treatment plan with patient and/or surrogate as well as nursing, discussions with consultants, evaluation of patient's response to treatment, examination of patient, obtaining history from patient or surrogate, ordering and performing treatments and interventions, ordering and review of laboratory studies, ordering and review of radiographic studies, pulse oximetry and re-evaluation of patient's condition. Medications Ordered in  ED Medications  0.9 %  sodium chloride infusion (1,000 mLs Intravenous New Bag/Given 05/29/18 2124)  fosPHENYtoin (  CEREBYX) 1,250 mg PE in sodium chloride 0.9 % 50 mL IVPB (has no administration in time range)  ceFEPIme (MAXIPIME) 2 g in sodium chloride 0.9 % 100 mL IVPB (has no administration in time range)  metroNIDAZOLE (FLAGYL) IVPB 500 mg (has no administration in time range)  vancomycin (VANCOCIN) IVPB 1000 mg/200 mL premix (has no administration in time range)  sodium chloride 0.9 % bolus 1,000 mL (has no administration in time range)  LORazepam (ATIVAN) injection 1 mg (1 mg Intravenous Given 05/29/18 2019)  LORazepam (ATIVAN) injection 1 mg (1 mg Intravenous Given 05/29/18 2033)  lacosamide (VIMPAT) 200 mg in sodium chloride 0.9 % 25 mL IVPB (0 mg Intravenous Stopped 05/29/18 2241)     Initial Impression / Assessment and Plan / ED Course  I have reviewed the triage vital signs and the nursing notes.  Pertinent labs & imaging results that were available during my care of the patient were reviewed by me and considered in my medical decision making (see chart for details).  Clinical Course as of Jun 04 1508  Sat May 29, 2018  2035 Consult: Reviewed to Dr. Lorraine Lax regarding seizure history and current seizure activity.  He will come to the emergency department for consultation.   [MP]  2303 Consult: Dr. Lorraine Lax has reviewed diagnostic results as well.  At this time, without elevation in white blood cell count, normal blood pressure and heart rate, marginally elevated lactic consistent with seizure, does not think patient needs LP.  Recommends proceeding with general antibiotic coverage for unknown source pending cultures.   [MP]  2306 Consult ordered for hospitalist   [MP]  2318 Consult: Dr. Myna Hidalgo has returned call except for admission   [MP]    Clinical Course User Index [MP] Charlesetta Shanks, MD   Patient presents with mental status changes seizures.  Patient has known seizure  disorder.  Due to being supratherapeutic on Dilantin, patient was holding Dilantin until repeat level after discharge.  Unfortunately, those were not checked due to apparent miscommunications with outpatient providers and family.  Patient was found today with mental status change.  CT head does not show any acute findings.  Patient is febrile without identifiable source.  Dr. Malen Gauze consulted for neurology.  He will proceed with ordering Dilantin is a patient is now undetectable.  I have managed acute seizures upon presentation.  Empiric antibiotics for unknown source ordered.  Patient does not have hypotension or tachycardia.  Lactic is mildly elevated consistent with seizure.  At this time, 30 cc/kg fluid resuscitation not indicated.  We will continue with 1 L bolus and maintenance fluids.  Plan for admission.   Final Clinical Impressions(s) / ED Diagnoses   Final diagnoses:  Seizures ([place])  Fever, unspecified fever cause  Altered mental status, unspecified altered mental status type    ED Discharge Orders    None       Charlesetta Shanks, MD 05/29/18 [place], MD 06/04/18 [phone]

## 2018-05-29 NOTE — ED Triage Notes (Signed)
Patient with seizures during the day today, she has been taken off her dilantin.  She was here for a week.  She is aloc upon EMS arrival.  She was found with laceration above right eye.  She has weakness of the left leg, but she has had this with a bulging disk.  VSS en route to ED.  CBG 103.  Patient is incontinent of urine.

## 2018-05-29 NOTE — ED Notes (Signed)
Pt's family members ran out of room and screamed that pt is having a seizure. This RN in room with Dr Johnney Killian. Pt being bagged and mouth suctioned. Pt observed to be convulsing violently. Pt has hx of seizures and takes dilantin. Pt was admitted earlier this week for toxic dilantin level. Pt placed on side and head protected. IV access extablished, 1mg  ativan given. Pt no longer seizing. Family at bedside. VSS. Pt to go to CT scan asap

## 2018-05-29 NOTE — ED Notes (Signed)
Unable to collect blood cultures several times due to minute veins , 2nd phlebotomist at bedside attempting to collect blood specimens .

## 2018-05-29 NOTE — ED Notes (Signed)
Pt's family reports that pt is having twitching and jerking motions every minute and that is what she does before she goes into a seizure. Dr Johnney Killian notified and ordering 1mg  of ativan IV.

## 2018-05-29 NOTE — ED Notes (Signed)
2nd Bag NS IV bolus infusing , Vancomycin / Flagyl/ Cefipime IV antibiotics infusing , admitting MD at bedside .

## 2018-05-29 NOTE — Consult Note (Addendum)
Neurology Consultation  Reason for Consult: Seizure Referring Physician: Dr. Vallery Ridge  CC: Seizure  History is obtained from: Family  HPI: [name] is a 62 y.o. 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 She was last seen normal Thursday, 05/27/2018 night by her daughter and spoke with her sister yesterday, 05/28/2018 at 8 PM with some concern for her voice being shakier than usual but was not seen in person until this evening when the family got concerned about her and went to check on her and found her with trauma to her right forehead and with generalized tonic-clonic shaking, head version and foaming at the mouth. She had also lost control of her bladder and had a very strong smell of urine according to the family. In the past, over these 30 years that she has had seizures, her seizures have mostly been focal with left arm and leg shaking with preservation of consciousness but today was very different because of the generalized tonic-clonic nature. She was recently discharged from the hospital after she presented here on 05/23/2018, with shortness of breath and chest pain and was found to have toxic Dilantin levels. On discharge, Dilantin was held because of toxic levels found at admission she was asked to follow-up with her neurologist, who she was supposed to see in the coming 2 days in clinic. She has been seen in the past by our service for breakthrough seizures as well as Dilantin toxicity. She is allergic to Keppra according to her family, it worsens her seizures. Family denies any history of preceding illnesses sicknesses fevers chills shortness of breath chest pain nausea vomiting abdominal pain diarrhea or symptoms concerning for UTI. Urinary toxicology  screen in the past has been negative for any illicit drug use. As per the family can tell, she is compliant to her medications. Upon chart review her medication dosages are as follows: -Dilantin 160 mg in the a.m. and 200 mg in the p.m. -Vimpat 200 twice daily -Gabapentin-was increased to 600 mg nightly on 04/07/2018 when she saw Dr. Ellouise Newer in clinic.  In the ER, she was continuing to seize, generalized tonic-clonic activity.  Was noted to be febrile.  Her lactate was elevated.  She was given 2 mill grams of Ativan in the tongue clonic activity stopped since.  ROS: ROS was performed and is negative except as noted in the HPI.   Past Medical History:  Diagnosis Date  . Asthma   . Cerebral hemorrhage ([place]) 1989  . Chronic back pain   . COPD (chronic obstructive pulmonary disease) ([place])   . DDD (degenerative disc disease) 06/12/2013  . DVT (deep venous thrombosis) ([place])    "she's had several since 1990"  . GERD (gastroesophageal reflux disease)   . Headache    "usually around time when she's had a seizure"  . History of blood transfusion    "when she was a baby"  . History of stomach ulcers   . Kidney stones   . Neuropathy   . Seizures ([place])    "because of her brain surgery"  . Stroke [facility]) [phone]   family denies residual on 11/09/2014  . Tunnel vision    "since brain OR"    Family History  Problem Relation Age of Onset  . Heart attack Mother  4 MIs, started in her 60s, also vaginal cancer and colon cancer  . Cancer Mother   . Heart attack Father        Died suddenly age 17 - autopsy revealed heart attack her patient  . Cancer Brother        Sinus cancer   Social History:   reports that she quit smoking about 7 months ago. Her smoking use included cigarettes. She has a 10.00 pack-year smoking history. She has never used smokeless tobacco. She reports that she does not drink alcohol or use drugs.  Medications  Current Facility-Administered Medications:  .  0.9 %   sodium chloride infusion, 1,000 mL, Intravenous, Continuous, Pfeiffer, Marcy, MD, Last Rate: 125 mL/hr at 05/29/18 2124, 1,000 mL at 05/29/18 2124  Current Outpatient Medications:  .  acetaminophen (TYLENOL) 325 MG tablet, Take 325 mg by mouth daily as needed for headache (pain). , Disp: , Rfl:  .  albuterol (PROVENTIL HFA;VENTOLIN HFA) 108 (90 Base) MCG/ACT inhaler, Inhale 2 puffs into the lungs every 6 (six) hours as needed for wheezing or shortness of breath., Disp: , Rfl:  .  gabapentin (NEURONTIN) 300 MG capsule, Take 1 capsule (300 mg total) by mouth 2 (two) times daily., Disp: , Rfl:  .  lacosamide (VIMPAT) 200 MG TABS tablet, Take 1 tablet (200 mg total) by mouth 2 (two) times daily., Disp: 180 tablet, Rfl: 3 .  vitamin B-12 (CYANOCOBALAMIN) 250 MCG tablet, Take 1 tablet (250 mcg total) by mouth daily., Disp: , Rfl:   Exam: Current vital signs: BP 126/67   Pulse (!) 101   Temp (!) 101.5 F (38.6 C) (Oral)   Resp 18   SpO2 100%  Vital signs in last 24 hours: Temp:  [101.5 F (38.6 C)] 101.5 F (38.6 C) (10/26 2009) Pulse Rate:  [85-106] 101 (10/26 2100) Resp:  [18-28] 18 (10/26 2100) BP: (116-136)/(61-68) 126/67 (10/26 2100) SpO2:  [96 %-100 %] 100 % (10/26 2100) GENERAL: Drowsy, opens eyes to voice, does not follow commands. HEENT: Bruise right forehead.  Bruising over the right eyelid as well.  No neck stiffness. LUNGS -scattered rales all over CV - S1S2 RRR, no m/r/g, equal pulses bilaterally. ABDOMEN - Soft, nontender, nondistended with normoactive BS Ext: warm, well perfused, intact peripheral pulses, no edema  NEURO:  Mental Status: Drowsy, opens her eyes to voice.  Does not follow commands.  Nonverbal. Cranial Nerves: PERRL. EOMI-showing roving eye movements, visual fields difficult to assess, no facial asymmetry, Motor: Moving right side spontaneously, appears slightly weaker on the left compared to the right. Tone: is normal and bulk is  normal Sensation-withdrawal to noxious stimulation on all 4 extremities Coordination: Unable to perform Gait- deferred Negative Kernig's and Brudzinski's.  Labs I have reviewed labs in epic and the results pertinent to this consultation are:  CBC    Component Value Date/Time   WBC 5.3 05/23/2018 1647   RBC 4.13 05/23/2018 1647   HGB 13.7 05/23/2018 1647   HCT 41.8 05/23/2018 1647   HCT 38.5 02/17/2017 0930   PLT 147 (L) 05/23/2018 1647   MCV 101.2 (H) 05/23/2018 1647   MCH 33.2 05/23/2018 1647   MCHC 32.8 05/23/2018 1647   RDW 12.9 05/23/2018 1647   RDW 12.9 06/10/2013 0812   LYMPHSABS 2.9 02/17/2017 0527   LYMPHSABS 1.4 06/10/2013 0812   MONOABS 0.3 02/17/2017 0527   EOSABS 0.1 02/17/2017 0527   EOSABS 0.0 06/10/2013 0812   BASOSABS 0.0 02/17/2017 0527   BASOSABS 0.0  06/10/2013 0812   CMP     Component Value Date/Time   NA 140 05/23/2018 1647   NA 145 (H) 06/10/2013 0812   K 3.9 05/23/2018 1647   CL 109 05/23/2018 1647   CO2 23 05/23/2018 1647   GLUCOSE 88 05/23/2018 1647   BUN 11 05/23/2018 1647   BUN 13 06/10/2013 0812   CREATININE 0.70 05/23/2018 1647   CALCIUM 9.1 05/23/2018 1647   PROT 6.2 04/08/2018 0928   PROT 6.1 06/10/2013 0812   ALBUMIN 3.6 05/23/2018 2254   ALBUMIN 4.5 06/10/2013 0812   AST 15 03/25/2018 0434   ALT 12 03/25/2018 0434   ALKPHOS 42 03/25/2018 0434   BILITOT 0.3 03/25/2018 0434   GFRNONAA >60 05/23/2018 1647   GFRAA >60 05/23/2018 1647    Lipid Panel     Component Value Date/Time   CHOL 161 05/24/2018 0556   TRIG 31 05/24/2018 0556   HDL 74 05/24/2018 0556   CHOLHDL 2.2 05/24/2018 0556   VLDL 6 05/24/2018 0556   LDLCALC 81 05/24/2018 0556  Dilantin level 05/23/2018 was 26, no concurrent albumin level was available but prior albumin levels were low indicating that the actual Dilantin level might have been higher than 26. His Dilantin level is pending at this time Imaging I have reviewed the images obtained: CT-scan of the  brain-change right parieto-occipital encephalomalacia.  Assessment:  62 year old woman past history of seizure disorder secondary to a right parietal/occipital bleed secondary to an AVM rupture many years ago, residual left homonymous hemianopsia presented to emergency room with generalized tonic clonic seizure activity and fall. Had generalized tonic-clonic activity in the emergency room that aborted with 2 mg of Ativan. Her examination has been consistent with a encephalopathic exam and has no focality including no gaze deviation or ongoing seizure activity. Likely breakthrough seizure in the setting of underlying infection. If no source is identified, she might need a spinal tap as she presented with fever and elevated lactate. She does not although have any neck stiffness or Kernig's and Brezinski sign.  Recommendations: -Supportive care per ER and primary team as you are. -Routine EEG in the morning. -Vimpat 200 IV x1. -Phenytoin level -Hold phenytoin mental levels are obtained -Ativan as needed for any breakthrough seizure activity lasting more than 5 minutes. -Please call neurology if that happens. -Check urinalysis chest x-ray -If she does not return back to baseline or continues to have more seizure activity and the leukocytosis does not resolve, might need a spinal tap. -Awaiting preliminary labs-further recommendations upon clinical course and lab results.  -- Amie Portland, MD [facility] Pager: [phone] If 7pm to 7am, please call on call as listed on AMION.   ADDENDUM CBC normal. Lactate mildly elevated. Exam slightly improving with patient being able to say her name and follow some commands. PHT level undetectable.  UPDATED RECS: -In addition to Vimpat 200 IV load, continue Vimpat 200 BID from tomorrow AM. -Also load with 1250mg  PE of fosphenytoin. -Check dilantin level in AM. -Dilantin 150 BID starting 05/29/18 am as she has been supratherapeutic on  home dse of Dilantin 160 qAM and 200 qPM. -Routine EEG in the AM -Exam improving, continue empiric antibiotics for sepsis. Low suspicion for CNS infection, given no leukocytosis, no neck stiffness or other signs and obvious cause (subtherapeutic dilantin level this time) for breakthrough seizure. No LP recommended at this time for that reason. -f/u on urinalysis - treat if positive for UTI  Neurology will follow.  -- Amie Portland,  MD [facility] Pager: [phone] If 7pm to 7am, please call on call as listed on AMION.  ADDENDUM Awake, alert, following commands at 0630 hrs UA + for UTI  Recs as above  -- Amie Portland, MD [facility] Pager: [phone] If 7pm to 7am, please call on call as listed on AMION.

## 2018-05-30 ENCOUNTER — Other Ambulatory Visit: Payer: Self-pay

## 2018-05-30 DIAGNOSIS — R4182 Altered mental status, unspecified: Secondary | ICD-10-CM | POA: Insufficient documentation

## 2018-05-30 DIAGNOSIS — R569 Unspecified convulsions: Secondary | ICD-10-CM | POA: Diagnosis not present

## 2018-05-30 DIAGNOSIS — J449 Chronic obstructive pulmonary disease, unspecified: Secondary | ICD-10-CM | POA: Diagnosis not present

## 2018-05-30 DIAGNOSIS — A419 Sepsis, unspecified organism: Secondary | ICD-10-CM | POA: Diagnosis not present

## 2018-05-30 LAB — BASIC METABOLIC PANEL
Anion gap: 12 (ref 5–15)
BUN: 14 mg/dL (ref 6–20)
CO2: 20 mmol/L — ABNORMAL LOW (ref 22–32)
CREATININE: 0.71 mg/dL (ref 0.44–1.00)
Calcium: 8.2 mg/dL — ABNORMAL LOW (ref 8.9–10.3)
Chloride: 107 mmol/L (ref 98–111)
GFR calc Af Amer: >60 mL/min (ref >60)
GLUCOSE: 104 mg/dL — AB (ref 70–99)
Potassium: 3.6 mmol/L (ref 3.5–5.1)
Sodium: 139 mmol/L (ref 135–145)

## 2018-05-30 LAB — INFLUENZA PANEL BY PCR (TYPE A & B)
INFLAPCR: NEGATIVE
INFLBPCR: NEGATIVE

## 2018-05-30 LAB — LACTIC ACID, PLASMA
LACTIC ACID, VENOUS: 1.3 mmol/L (ref 0.5–1.9)
Lactic Acid, Venous: 1.1 mmol/L (ref 0.5–1.9)

## 2018-05-30 MED ORDER — CYANOCOBALAMIN 1000 MCG/ML IJ SOLN
1000.0000 ug | Freq: Once | INTRAMUSCULAR | Status: AC
  Administered 2018-05-30: 1000 ug via SUBCUTANEOUS
  Filled 2018-05-30: qty 1

## 2018-05-30 MED ORDER — ALBUTEROL SULFATE (2.5 MG/3ML) 0.083% IN NEBU: 2.5000 mg | INHALATION_SOLUTION | RESPIRATORY_TRACT | Status: DC | PRN

## 2018-05-30 MED ORDER — ACETAMINOPHEN 325 MG PO TABS
650.0000 mg | ORAL_TABLET | Freq: Four times a day (QID) | ORAL | Status: DC | PRN
  Administered 2018-05-30 – 2018-05-31 (×2): 650 mg via ORAL
  Filled 2018-05-30 (×2): qty 2

## 2018-05-30 NOTE — ED Notes (Signed)
Ordered lunch diet tray from [facility]

## 2018-05-30 NOTE — Progress Notes (Signed)
[facility] TEAM 1 - Stepdown/ICU TEAM  [name]  RDE:[id number] DOB: 06/24/1956 DOA: 05/29/2018 PCP: [name], MD    Brief Narrative:  62 y.o. female with a hx of intracranial hemorrhage with subsequent seizure disorder, COPD, and neuropathy who presented with seizures. Patient was recently admitted with Dilantin toxicity, Dilantin was being held pending a repeat level.  In the ED CT head featured stable extensive encephalomalacia and no acute findings. Phenytoin level was undetectable.  Neurology was consulted and patient was loaded with Vimpat and phenytoin.  She had generalized seizures in the ED that were aborted with Ativan.    Significant Events: 10/26 admit   Subjective: Pt is awake, but confused. She is in no resp distress. She c/o some pain in the head. No cp or sob. No N/V, or abdom pain.   Assessment & Plan:  Seizures  hx of right parietal/occiptal AVM with hemmorrhage and subsequent seizure disorder - recently holding Dilantin after admission with toxicity - Head CT is stable - loaded with Vimpat and fosphenytoin - Neurology following   Sepsis secondary to UTI  febrile with elevated lactate, and UA positive - continue empiric antibiotics - sepsis physiology has resolved - f/u culture data   COPD  Quiescent presently   Neuropathy  Continue Neurontin and B12 supplementation   DVT prophylaxis: SCDs Code Status: FULL CODE Family Communication: no family present at time of exam  Disposition Plan: transfer to tele bed   Consultants:  Neurology   Antimicrobials:  Rocephin 10/26 >  Objective: Blood pressure 118/63, pulse 80, temperature 98.6 F (37 C), temperature source Oral, resp. rate 12, height 5\' 8"  (1.727 m), weight 83.9 kg, SpO2 95 %.  Intake/Output Summary (Last 24 hours) at 05/30/2018 1359 Last data filed at 05/30/2018 1159 Gross per 24 hour  Intake 3850 ml  Output 500 ml  Net 3350 ml   Filed Weights   05/29/18 2150  Weight: 83.9 kg     Examination: General: No acute respiratory distress Lungs: Clear to auscultation bilaterally without wheezes or crackles Cardiovascular: Regular rate and rhythm without murmur gallop or rub normal S1 and S2 Abdomen: Nontender, nondistended, soft, bowel sounds positive, no rebound, no ascites, no appreciable mass Extremities: No significant cyanosis, clubbing, or edema bilateral lower extremities  CBC: Recent Labs  Lab 05/23/18 1647 05/29/18 2129 05/29/18 2133  WBC 5.3 9.7  --   NEUTROABS  --  8.2*  --   HGB 13.7 14.4 15.0  HCT 41.8 44.3 44.0  MCV 101.2* 102.1*  --   PLT 147* 129*  --    Basic Metabolic Panel: Recent Labs  Lab 05/23/18 1647 05/29/18 2129 05/29/18 2133 05/30/18 0329  NA 140 137 138 139  K 3.9 4.4 4.3 3.6  CL 109 104 105 107  CO2 23 23  --  20*  GLUCOSE 88 128* 126* 104*  BUN 11 21* 28* 14  CREATININE 0.70 0.96 0.80 0.71  CALCIUM 9.1 9.1  --  8.2*  MG  --  2.1  --   --   PHOS  --  3.5  --   --    GFR: Estimated Creatinine Clearance: 84.9 mL/min (by C-G formula based on SCr of 0.71 mg/dL).  Liver Function Tests: Recent Labs  Lab 05/23/18 2254 05/29/18 2129  AST  --  30  ALT  --  21  ALKPHOS  --  55  BILITOT  --  1.1  PROT  --  6.8  ALBUMIN 3.6 4.1  Recent Labs  Lab 05/29/18 2129  LIPASE 26   Recent Labs  Lab 05/29/18 2129  AMMONIA 37*    Coagulation Profile: Recent Labs  Lab 05/29/18 2129  INR 1.10    Cardiac Enzymes: Recent Labs  Lab 05/23/18 2254 05/24/18 0556 05/24/18 0846 05/29/18 2129  CKTOTAL  --   --   --  144  TROPONINI <0.03 <0.03 <0.03  --     HbA1C: Hgb A1c MFr Bld  Date/Time Value Ref Range Status  05/24/2018 05:56 AM 5.2 4.8 - 5.6 % Final    Comment:    (NOTE)         Prediabetes: 5.7 - 6.4         Diabetes: >6.4         Glycemic control for adults with diabetes: <7.0   03/21/2018 03:10 PM 5.1 4.8 - 5.6 % Final    Comment:    (NOTE) Pre diabetes:          5.7%-6.4% Diabetes:               >6.4% Glycemic control for   <7.0% adults with diabetes     Scheduled Meds: . gabapentin  300 mg Oral BID  . lacosamide  200 mg Oral BID  . phenytoin  150 mg Oral BID  . sodium chloride flush  3 mL Intravenous Q12H  . vitamin B-12  1,000 mcg Oral Daily   Continuous Infusions: . cefTRIAXone (ROCEPHIN)  IV Stopped (05/30/18 3606)     LOS: 0 days   Cherene Altes, MD [facility] Office  [phone] Pager - Text Page per Shea Evans  If 7PM-7AM, please contact night-coverage per Amion 05/30/2018, 1:59 PM

## 2018-05-30 NOTE — Progress Notes (Signed)
Reason for consult:   Subjective: Patient's mental status has been improving, no further seizures since yesterday.   ROS: negative except above Examination  Vital signs in last 24 hours: Temp:  [98.2 F (36.8 C)-101.5 F (38.6 C)] 98.2 F (36.8 C) (10/27 1707) Pulse Rate:  [65-106] 80 (10/27 1346) Resp:  [12-28] 12 (10/27 1346) BP: (95-136)/(38-89) 124/60 (10/27 1707) SpO2:  [93 %-100 %] 95 % (10/27 1346) Weight:  [83.9 kg] 83.9 kg (10/26 2150)  General: lying in bed CVS: pulse-normal rate and rhythm RS: breathing comfortably Extremities: normal   Neuro: MS: Alert, oriented to place and person, not  oriented towards the date.  Follows commands CN: pupils equal and reactive,  EOMI, face symmetric, tongue midline, normal sensation over face, Motor: 5/5 strength in all 4 extremities Coordination: normal, no dysmetria Gait: not tested  Basic Metabolic Panel: Recent Labs  Lab 05/29/18 2129 05/29/18 2133 05/30/18 0329  NA 137 138 139  K 4.4 4.3 3.6  CL 104 105 107  CO2 23  --  20*  GLUCOSE 128* 126* 104*  BUN 21* 28* 14  CREATININE 0.96 0.80 0.71  CALCIUM 9.1  --  8.2*  MG 2.1  --   --   PHOS 3.5  --   --     CBC: Recent Labs  Lab 05/29/18 2129 05/29/18 2133  WBC 9.7  --   NEUTROABS 8.2*  --   HGB 14.4 15.0  HCT 44.3 44.0  MCV 102.1*  --   PLT 129*  --      Coagulation Studies: Recent Labs    05/29/18 2129  LABPROT 14.1  INR 1.10    Imaging Reviewed:     ASSESSMENT AND PLAN  62 year old woman with past medical history of seizures secondary to right parietal hemorrhage from ruptured AVM presents to the ER with generalized tonic-clonic seizure, likely in due to subtherapeutic Dilantin and urinary tract infection.  She has had no further seizures, almost back to her baseline.  I do not think she needs an EEG this morning, she does not appear to be having seizure further seizures and has known history of seizure secondary to ruptured  AVM.  Plan Continue Dilantin, recheck levels in 5 days Seizure precautions Continue to treat UTI PT/OT eval and can be discharged home if cleared  Thanks for the consult. Please call if any questions.   Karena Addison Nilton Lave [facility] Pager Number [phone] For questions after 7pm please refer to AMION to reach the Neurologist on call

## 2018-05-31 ENCOUNTER — Ambulatory Visit: Payer: Self-pay | Admitting: Neurology

## 2018-05-31 ENCOUNTER — Observation Stay (HOSPITAL_COMMUNITY): Payer: Medicare HMO

## 2018-05-31 DIAGNOSIS — R569 Unspecified convulsions: Secondary | ICD-10-CM | POA: Diagnosis not present

## 2018-05-31 DIAGNOSIS — R410 Disorientation, unspecified: Secondary | ICD-10-CM | POA: Diagnosis not present

## 2018-05-31 DIAGNOSIS — J449 Chronic obstructive pulmonary disease, unspecified: Secondary | ICD-10-CM

## 2018-05-31 DIAGNOSIS — A419 Sepsis, unspecified organism: Secondary | ICD-10-CM | POA: Diagnosis not present

## 2018-05-31 DIAGNOSIS — R4182 Altered mental status, unspecified: Secondary | ICD-10-CM | POA: Diagnosis not present

## 2018-05-31 LAB — CBC
HCT: 40.5 % (ref 36.0–46.0)
HEMOGLOBIN: 13.2 g/dL (ref 12.0–15.0)
MCH: 32.8 pg (ref 26.0–34.0)
MCHC: 32.6 g/dL (ref 30.0–36.0)
MCV: 100.5 fL — ABNORMAL HIGH (ref 80.0–100.0)
NRBC: 0 % (ref 0.0–0.2)
Platelets: 130 10*3/uL — ABNORMAL LOW (ref 150–400)
RBC: 4.03 MIL/uL (ref 3.87–5.11)
RDW: 12.6 % (ref 11.5–15.5)
WBC: 6.8 10*3/uL (ref 4.0–10.5)

## 2018-05-31 LAB — COMPREHENSIVE METABOLIC PANEL
ALT: 14 U/L (ref 0–44)
AST: 23 U/L (ref 15–41)
Albumin: 3.4 g/dL — ABNORMAL LOW (ref 3.5–5.0)
Alkaline Phosphatase: 45 U/L (ref 38–126)
Anion gap: 8 (ref 5–15)
BILIRUBIN TOTAL: 1 mg/dL (ref 0.3–1.2)
BUN: 9 mg/dL (ref 6–20)
CO2: 26 mmol/L (ref 22–32)
Calcium: 8.7 mg/dL — ABNORMAL LOW (ref 8.9–10.3)
Chloride: 105 mmol/L (ref 98–111)
Creatinine, Ser: 0.67 mg/dL (ref 0.44–1.00)
GFR calc Af Amer: >60 mL/min (ref >60)
Glucose, Bld: 91 mg/dL (ref 70–99)
POTASSIUM: 3.3 mmol/L — AB (ref 3.5–5.1)
Sodium: 139 mmol/L (ref 135–145)
TOTAL PROTEIN: 6.1 g/dL — AB (ref 6.5–8.1)

## 2018-05-31 MED ORDER — LORAZEPAM 2 MG/ML IJ SOLN
0.5000 mg | Freq: Once | INTRAMUSCULAR | Status: AC
  Administered 2018-05-31: 0.5 mg via INTRAVENOUS

## 2018-05-31 MED ORDER — PHENYTOIN SODIUM EXTENDED 30 MG PO CAPS: 150.0000 mg | ORAL_CAPSULE | Freq: Two times a day (BID) | ORAL | 0 refills | Status: DC

## 2018-05-31 MED ORDER — KETOROLAC TROMETHAMINE 15 MG/ML IJ SOLN: 15.0000 mg | Freq: Three times a day (TID) | INTRAMUSCULAR | Status: DC | PRN

## 2018-05-31 MED ORDER — POTASSIUM CHLORIDE CRYS ER 20 MEQ PO TBCR
40.0000 meq | EXTENDED_RELEASE_TABLET | Freq: Once | ORAL | Status: AC
  Administered 2018-05-31: 40 meq via ORAL
  Filled 2018-05-31: qty 2

## 2018-05-31 MED ORDER — LORAZEPAM 2 MG/ML IJ SOLN
INTRAMUSCULAR | Status: AC
  Administered 2018-05-31: 0.5 mg via INTRAVENOUS
  Filled 2018-05-31: qty 1

## 2018-05-31 NOTE — Progress Notes (Signed)
Subjective: Improving though still confused.   Exam: Vitals:   05/31/18 0837 05/31/18 1343  BP: 120/65 (!) 131/58  Pulse: 77 85  Resp: 16 18  Temp: 97.9 F (36.6 C) 98.2 F (36.8 C)  SpO2: 99% 98%   Gen: In bed, NAD Resp: non-labored breathing, no acute distress Abd: soft, nt  Neuro: MS: Awake, alert, does not know month or year, but does know [place].  UK:GURKY, VFF Motor: MAEW Sensory:intact to LT  Impression: 62 yo F with recurrent seizures of unclear duration on Saturday, now with delirium and slow return to baseline, though she is improving. With persistent confusion, I think EEG and MRI would be reasonable.   Recommendations: 1)MRI brain, EEG 2) continue AEDs  Roland Rack, MD [facility] [phone]  If 7pm- 7am, please page neurology on call as listed in [place].

## 2018-05-31 NOTE — Discharge Instructions (Signed)
Confusion Confusion is the inability to think with your usual speed or clarity. Confusion may come on quickly or slowly over time. How quickly the confusion comes on depends on the cause. Confusion can be due to any number of causes. What are the causes?  Concussion, head injury, or head trauma.  Seizures.  Stroke.  Fever.  Brain tumor.  Age related decreased brain function (dementia).  Heightened emotional states like rage or terror.  Mental illness in which the person loses the ability to determine what is real and what is not (hallucinations).  Infections such as a urinary tract infection (UTI).  Toxic effects from alcohol, drugs, or prescription medicines.  Dehydration and an imbalance of salts in the body (electrolytes).  Lack of sleep.  Low blood sugar (diabetes).  Low levels of oxygen from conditions such as chronic lung disorders.  Drug interactions or other medicine side effects.  Nutritional deficiencies, especially niacin, thiamine, vitamin C, or vitamin B.  Sudden drop in body temperature (hypothermia).  Change in routine, such as when traveling or hospitalized. What are the signs or symptoms? People often describe their thinking as cloudy or unclear when they are confused. Confusion can also include feeling disoriented. That means you are unaware of where or who you are. You may also not know what the date or time is. If confused, you may also have difficulty paying attention, remembering, and making decisions. Some people also act aggressively when they are confused. How is this diagnosed? The medical evaluation of confusion may include:  Blood and urine tests.  X-rays.  Brain and nervous system tests.  Analyzing your brain waves (electroencephalogram or EEG).  Magnetic resonance imaging (MRI) of your head.  Computed tomography (CT) scan of your head.  Mental status tests in which your health care provider may ask many questions. Some of these  questions may seem silly or strange, but they are a very important test to help diagnose and treat confusion.  How is this treated? An admission to the hospital may not be needed, but a person with confusion should not be left alone. Stay with a family member or friend until the confusion clears. Avoid alcohol, pain relievers, or sedative drugs until you have fully recovered. Do not drive until directed by your health care provider. Follow these instructions at home: What family and friends can do:  To find out if someone is confused, ask the person to state his or her name, age, and the date. If the person is unsure or answers incorrectly, he or she is confused.  Always introduce yourself, no matter how well the person knows you.  Often remind the person of his or her location.  Place a calendar and clock near the confused person.  Help the person with his or her medicines. You may want to use a pill box, an alarm as a reminder, or give the person each dose as prescribed.  Talk about current events and plans for the day.  Try to keep the environment calm, quiet, and peaceful.  Make sure the person keeps follow-up visits with his or her health care provider.  How is this prevented? Ways to prevent confusion:  Avoid alcohol.  Eat a balanced diet.  Get enough sleep.  Take medicine only as directed by your health care provider.  Do not become isolated. Spend time with other people and make plans for your days.  Keep careful watch on your blood sugar levels if you are diabetic.  Get help  right away if:  You develop severe headaches, repeated vomiting, seizures, blackouts, or slurred speech.  There is increasing confusion, weakness, numbness, restlessness, or personality changes.  You develop a loss of balance, have marked dizziness, feel uncoordinated, or fall.  You have delusions, hallucinations, or develop severe anxiety.  Your family members think you need to be  rechecked. This information is not intended to replace advice given to you by your health care provider. Make sure you discuss any questions you have with your health care provider. Document Released: 08/28/2004 Document Revised: 02/08/2016 Document Reviewed: 08/26/2013 Elsevier Interactive Patient Education  2018 [place] not drive, operate heavy machinery, perform activities at heights, swimming or participation in water activities or provide baby sitting services until you have seen by Primary MD or a Neurologist and advised to do so again.  Follow with Primary MD [name], MD and your Neurologist  in 5 days   Get CBC, CMP, Dilantin level -  checked  by Primary MD in 5  days   Activity: As tolerated with Full fall precautions use walker/cane & assistance as needed  Disposition Home    Diet: Heart Healthy   Special Instructions: If you have smoked or chewed Tobacco  in the last 2 yrs please stop smoking, stop any regular Alcohol  and or any Recreational drug use.  On your next visit with your primary care physician please Get Medicines reviewed and adjusted.  Please request your Prim.MD to go over all Hospital Tests and Procedure/Radiological results at the follow up, please get all Hospital records sent to your Prim MD by signing hospital release before you go home.  If you experience worsening of your admission symptoms, develop shortness of breath, life threatening emergency, suicidal or homicidal thoughts you must seek medical attention immediately by calling 911 or calling your MD immediately  if symptoms less severe.  You Must read complete instructions/literature along with all the possible adverse reactions/side effects for all the Medicines you take and that have been prescribed to you. Take any new Medicines after you have completely understood and accpet all the possible adverse reactions/side effects.

## 2018-05-31 NOTE — Evaluation (Signed)
Physical Therapy Evaluation Patient Details Name: [name] MRN: [id number] DOB: Sep 05, 1955 Today's Date: 05/31/2018   History of Present Illness  62yo female found at home by family with poor responsiveness, bleeding laceration over R eye, incontinent of urine. Presented to the ED due to concern of seizure, note recent admission for Dilantin toxicity. She was admitted for evaluation/management of potential seizures and sepsis secondary to UTI. PMH asthma, hx cerebral hemorrhage, chronic back pain, DDD, hx DVT, neuropathy, seizures, CVA, visual deficit, hx brain surgery   Clinical Impression   Patient received in bed, very pleasant and willing to participate with PT this morning. Able to complete bed mobility with S, however with attempt for sit to stand was very wobbly and with strong posterior/lateral unsteadiness, patient appears to be unaware of balance impairments in standing. Then used RW with constant min guard for functional transfers and gait approximately 160f, even with RW patient with scissoring gait pattern, drifts L/R, and requires VC for obstacle navigation in hallway. She was left in bed with all needs met, bed alarm activated this morning. She will continue to benefit from skilled PT services in the acute setting as well as skilled HHPT services moving forward.     Follow Up Recommendations Home health PT;Supervision/Assistance - 24 hour;Supervision for mobility/OOB    Equipment Recommendations  Rolling walker with 5" wheels    Recommendations for Other Services       Precautions / Restrictions Precautions Precautions: Fall Precaution Comments: Pt with h/o neuropathy and bilateral LE pain Restrictions Weight Bearing Restrictions: No      Mobility  Bed Mobility Overal bed mobility: Needs Assistance Bed Mobility: Supine to Sit;Sit to Supine     Supine to sit: Supervision Sit to supine: Supervision   General bed mobility comments: S for safety, no physical  assist given   Transfers Overall transfer level: Needs assistance Equipment used: Rolling walker (2 wheeled) Transfers: Sit to/from Stand Sit to Stand: Min guard         General transfer comment: Min guard to steady due to posterior LOB with standing   Ambulation/Gait Ambulation/Gait assistance: Min guard Gait Distance (Feet): 150 Feet Assistive device: Rolling walker (2 wheeled) Gait Pattern/deviations: Step-through pattern;Decreased stride length;Staggering right;Staggering left;Narrow base of support;Scissoring     General Gait Details: consistent min guard for safety with RW, even with RW mild unsteadiness and L/R drifting noted. Heavy reliance on UE support. Cues for navigating obstacles in hallway   Stairs            Wheelchair Mobility    Modified Rankin (Stroke Patients Only)       Balance Overall balance assessment: Needs assistance Sitting-balance support: No upper extremity supported;Feet supported Sitting balance-Leahy Scale: Good     Standing balance support: Bilateral upper extremity supported;During functional activity Standing balance-Leahy Scale: Poor Standing balance comment: Heavy reliance on UE support and external assist                             Pertinent Vitals/Pain Pain Assessment: Faces Pain Score: 0-No pain Faces Pain Scale: No hurt Pain Intervention(s): Limited activity within patient's tolerance;Monitored during session    Home Living Family/patient expects to be discharged to:: Private residence Living Arrangements: Spouse/significant other Available Help at Discharge: Family;Available PRN/intermittently Type of Home: House Home Access: Stairs to enter Entrance Stairs-Rails: Left Entrance Stairs-Number of Steps: 2 Home Layout: One level Home Equipment: None Additional Comments: Reports she used to  live with her daughter, but has since moved out on her own just over 3 weeks ago..     Prior Function Level of  Independence: Independent               Hand Dominance        Extremity/Trunk Assessment   Upper Extremity Assessment Upper Extremity Assessment: Defer to OT evaluation    Lower Extremity Assessment Lower Extremity Assessment: Generalized weakness    Cervical / Trunk Assessment Cervical / Trunk Assessment: Normal  Communication   Communication: No difficulties  Cognition Arousal/Alertness: Awake/alert Behavior During Therapy: WFL for tasks assessed/performed Overall Cognitive Status: Impaired/Different from baseline Area of Impairment: Attention;Memory;Following commands;Safety/judgement;Awareness;Problem solving                   Current Attention Level: Selective Memory: Decreased recall of precautions Following Commands: Follows one step commands consistently;Follows multi-step commands inconsistently;Follows multi-step commands with increased time Safety/Judgement: Decreased awareness of safety Awareness: Intellectual Problem Solving: Difficulty sequencing;Requires verbal cues;Requires tactile cues        General Comments      Exercises     Assessment/Plan    PT Assessment Patient needs continued PT services  PT Problem List Decreased strength;Decreased balance;Decreased mobility;Decreased coordination;Decreased activity tolerance;Decreased knowledge of use of DME;Decreased knowledge of precautions;Decreased safety awareness       PT Treatment Interventions DME instruction;Gait training;Stair training;Functional mobility training;Therapeutic activities;Balance training;Therapeutic exercise;Patient/family education;Neuromuscular re-education    PT Goals (Current goals can be found in the Care Plan section)  Acute Rehab PT Goals Patient Stated Goal: get back to normal  PT Goal Formulation: With patient Time For Goal Achievement: 06/14/18 Potential to Achieve Goals: Good    Frequency Min 3X/week   Barriers to discharge         Co-evaluation               AM-PAC PT "6 Clicks" Daily Activity  Outcome Measure Difficulty turning over in bed (including adjusting bedclothes, sheets and blankets)?: None Difficulty moving from lying on back to sitting on the side of the bed? : None Difficulty sitting down on and standing up from a chair with arms (e.g., wheelchair, bedside commode, etc,.)?: A Little Help needed moving to and from a bed to chair (including a wheelchair)?: A Lot Help needed walking in hospital room?: A Lot Help needed climbing 3-5 steps with a railing? : A Lot 6 Click Score: 17    End of Session Equipment Utilized During Treatment: Gait belt Activity Tolerance: Patient tolerated treatment well Patient left: in bed;with call bell/phone within reach;with bed alarm set Nurse Communication: Mobility status PT Visit Diagnosis: Other abnormalities of gait and mobility (R26.89);Unsteadiness on feet (R26.81);Muscle weakness (generalized) (M62.81)    Time: 8937-3428 PT Time Calculation (min) (ACUTE ONLY): 17 min   Charges:   PT Evaluation $PT Eval Moderate Complexity: 1 Mod          Deniece Ree PT, DPT, CBIS  Supplemental Physical Therapist [place]    Pager [phone] Acute Rehab Office [phone]

## 2018-05-31 NOTE — Progress Notes (Signed)
I received a [place] to provide spiritual support for the patient. I visited the patient's room and shared words of encouragement. I provided spiritual support by listening to the patient with compassion and by leading in prayer. I shared that the Chaplain is available for additional support as needed or requested.    05/31/18 1200  Clinical Encounter Type  Visited With Patient  Visit Type Follow-up;Spiritual support  Referral From Nurse  Consult/Referral To Chaplain  Spiritual Encounters  Spiritual Needs Prayer    Chaplain Dr Redgie Grayer

## 2018-05-31 NOTE — Progress Notes (Signed)
[facility] TEAM 1 - Stepdown/ICU TEAM  [name]  BHA:[id number] DOB: [birth year] DOA: 05/29/2018 PCP: [name], MD    Brief Narrative:  62 y.o. female with a hx of intracranial hemorrhage with subsequent seizure disorder, COPD, and neuropathy who presented with seizures. Patient was recently admitted with Dilantin toxicity, Dilantin was being held pending a repeat level.  In the ED CT head featured stable extensive encephalomalacia and no acute findings. Phenytoin level was undetectable.  Neurology was consulted and patient was loaded with Vimpat and phenytoin.  She had generalized seizures in the ED that were aborted with Ativan.    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  She has been restarted on Dilantin and Vimpat combination, remains stable, mildly delirious hence neurology wants to get an EEG to rule out underlying seizure activity.  Head CT was nonacute.  No focal deficits.  Check EEG today if stable discharge tomorrow.    Sepsis secondary to UTI - febrile with elevated lactate, urine culture now growing E. coli continue Rocephin and monitor.  Sepsis physiology has resolved.     COPD - Quiescent presently   Neuropathy - Continue Neurontin and B12 supplementation, check B12 and TSH as well.    DVT prophylaxis: SCDs Code Status: FULL CODE Family Communication: Daughter bedside Disposition Plan: Equal bed, advance activity, check EEG if stable discharge tomorrow.  Consultants:  Neurology   Antimicrobials:  Rocephin 10/26 >  Objective: Blood pressure 120/65, pulse 77, temperature 97.9 F (36.6 C), temperature source Oral, resp. rate 16, height 5\' 8"  (1.727 m), weight 83.9 kg, SpO2 99 %.  Intake/Output Summary (Last 24  hours) at 05/31/2018 1151 Last data filed at 05/31/2018 0952 Gross per 24 hour  Intake 103 ml  Output 500 ml  Net -397 ml   Filed Weights   05/29/18 2150  Weight: 83.9 kg    Examination:  Awake Alert, but at times mildly confused, No new F.N deficits, Normal affect [place].AT,PERRAL Supple Neck,No JVD, No cervical lymphadenopathy appriciated.  Symmetrical Chest wall movement, Good air movement bilaterally, CTAB RRR,No Gallops, Rubs or new Murmurs, No Parasternal Heave +ve B.Sounds, Abd Soft, No tenderness, No organomegaly appriciated, No rebound - guarding or rigidity. No Cyanosis, Clubbing or edema, No new Rash or bruise   CBC: Recent Labs  Lab 05/29/18 2129 05/29/18 2133 05/31/18 0515  WBC 9.7  --  6.8  NEUTROABS 8.2*  --   --   HGB 14.4 15.0 13.2  HCT 44.3 44.0 40.5  MCV 102.1*  --  100.5*  PLT 129*  --  973*   Basic Metabolic Panel: Recent Labs  Lab 05/29/18 2129 05/29/18 2133 05/30/18 0329 05/31/18 0515  NA 137 138 139 139  K 4.4 4.3 3.6 3.3*  CL 104 105 107 105  CO2 23  --  20* 26  GLUCOSE 128* 126* 104* 91  BUN 21* 28* 14 9  CREATININE 0.96 0.80 0.71 0.67  CALCIUM 9.1  --  8.2* 8.7*  MG 2.1  --   --   --   PHOS 3.5  --   --   --    GFR: Estimated Creatinine Clearance: 84.9 mL/min (by C-G formula based on SCr of 0.67 mg/dL).  Liver Function Tests: Recent Labs  Lab 05/29/18 2129  05/31/18 0515  AST 30 23  ALT 21 14  ALKPHOS 55 45  BILITOT 1.1 1.0  PROT 6.8 6.1*  ALBUMIN 4.1 3.4*   Recent Labs  Lab 05/29/18 2129  LIPASE 26   Recent Labs  Lab 05/29/18 2129  AMMONIA 37*    Coagulation Profile: Recent Labs  Lab 05/29/18 2129  INR 1.10    Cardiac Enzymes: Recent Labs  Lab 05/29/18 2129  CKTOTAL 144    HbA1C: Hgb A1c MFr Bld  Date/Time Value Ref Range Status  05/24/2018 05:56 AM 5.2 4.8 - 5.6 % Final    Comment:    (NOTE)         Prediabetes: 5.7 - 6.4         Diabetes: >6.4         Glycemic control for adults with  diabetes: <7.0   03/21/2018 03:10 PM 5.1 4.8 - 5.6 % Final    Comment:    (NOTE) Pre diabetes:          5.7%-6.4% Diabetes:              >6.4% Glycemic control for   <7.0% adults with diabetes     Scheduled Meds: . gabapentin  300 mg Oral BID  . lacosamide  200 mg Oral BID  . phenytoin  150 mg Oral BID  . sodium chloride flush  3 mL Intravenous Q12H  . vitamin B-12  1,000 mcg Oral Daily   Continuous Infusions: . cefTRIAXone (ROCEPHIN)  IV 1 g (05/31/18 0553)     LOS: 0 days   Signature  Lala Lund M.D on 05/31/2018 at 11:51 AM   -  To page go to [email or web address] - password [facility]

## 2018-05-31 NOTE — Procedures (Signed)
ELECTROENCEPHALOGRAM REPORT   Patient: [name]       Room #: 6Z12W EEG No. ID: [id number] Age: 62 y.o.        Sex: female Referring Physician: Candiss Norse Report Date:  05/31/2018        Interpreting Physician: Alexis Goodell  History: [name] is an 62 y.o. female with a history of ICH and seizure  Medications:  Rocephin, Neurontin, Vimpat, Dilantin, B12  Conditions of Recording:  This is a 21 channel routine scalp EEG performed with bipolar and monopolar montages arranged in accordance to the international 10/20 system of electrode placement. One channel was dedicated to EKG recording.  The patient is in the awake state.  Description:  Muscle and movement artifact are prominent during the recording often obscuring the background rhythm. When able to be visualized the waking background activity consists of a low voltage, symmetrical, fairly well organized, 9-10 Hz alpha activity, seen from the parieto-occipital and posterior temporal regions that is poorly sustained.  Low voltage fast activity, poorly organized, is seen anteriorly and is at times superimposed on more posterior regions.  A mixture of theta and alpha rhythms are seen from the central and temporal regions. The patient does not drowse or sleep. The patient has multiple episode of head tremor.  During these episodes there is occipital artifact due to head movement but there does not appear to be any epileptiform activity.   Hyperventilation and intermittent photic stimulation were not performed.   IMPRESSION: This is a technically difficult record due to the predominance of muscle and movement artifact but appears to be a normal awake recording.     Alexis Goodell, MD Neurology [phone] 05/31/2018, 6:12 PM

## 2018-05-31 NOTE — Consult Note (Addendum)
   [facility] CM Inpatient Consult   05/31/2018  [name] Jan 04, 1956 [id number]  Patient active with [facility]. Spoke with patient at bedside concerning any additional needs at this time. Ms. Hesler was able to verify her name and date of birth. Discussed with patient recent calls which she received from the [place] and [place]. Ms. Marsalis states, "I'm sorry. I can't remember those conversations at this time."   BSW and [facility] Pharmacist notified of Ms. Satre's admission.   Netta Cedars, MSN, BSN, RN [facility] Liaison  Office: [phone]

## 2018-05-31 NOTE — Progress Notes (Signed)
EEG COMPLETED; RESULTS PENDING.

## 2018-05-31 NOTE — Care Management Note (Signed)
Case Management Note  Patient Details  Name: [name] MRN: [id number] Date of Birth: Mar 03, 1956  Subjective/Objective:   62yo female found at home by family with poor responsiveness, bleeding laceration over R eye, incontinent of urine. Presented to the ED due to concern of seizure, note recent admission for Dilantin toxicity. She was admitted for evaluation/management of potential seizures and sepsis secondary to UTI.  PTA, pt independent, lives with daughter, who works.                  Action/Plan: PT recommending HH follow up, and pt agreeable to [facility] services.  She prefers I speak with daughter re: HH choice.  Spoke with daughter, [name], by phone; referral to [facility], per daughter's choice.  Start of care 24-48h post dc date.  RW to be delivered to pt's room prior to dc.  Expected Discharge Date:  05/31/18               Expected Discharge Plan:  [place]  In-House Referral:     Discharge planning Services  CM Consult  Post Acute Care Choice:  Home Health Choice offered to:  Patient, Adult Children  DME Arranged:  Walker rolling DME Agency:  [place] Arranged:  PT [place]:  [place]  Status of Service:  Completed, signed off  If discussed at [place] of Stay Meetings, dates discussed:    Additional Comments:  Reinaldo Raddle, RN, BSN  Trauma/Neuro ICU Case Manager [phone]

## 2018-05-31 NOTE — Care Management Obs Status (Signed)
[place] NOTIFICATION   Patient Details  Name: [name] MRN: [id number] Date of Birth: 28-May-1956   Medicare Observation Status Notification Given:  Yes    Ella Bodo, RN 05/31/2018, 12:37 PM

## 2018-05-31 NOTE — Discharge Summary (Signed)
[name] QGB:[id number] DOB: [birth year] DOA: 05/29/2018  PCP: [name], MD  Admit date: 05/29/2018  Discharge date: 06/01/2018  Admitted From: Home   Disposition:  Home   Recommendations for Outpatient Follow-up:   Follow up with PCP in 1-2 weeks  PCP Please obtain BMP/CBC, 2 view CXR in 1week,  (see Discharge instructions)   PCP Please follow up on the following pending results:    Home Health: PT, RN  Equipment/Devices: Walker  Consultations: Neuro Discharge Condition: Fair   CODE STATUS:    Diet Recommendation: Heart Healthy     Chief Complaint  Patient presents with  . Seizures     Brief history of present illness from the day of admission and additional interim summary    62 y.o.femalewith a hx of intracranial hemorrhage with subsequent seizure disorder, COPD, and neuropathy who presented with seizures. Patient was recently admitted with Dilantin toxicity, Dilantin was being held pending a repeat level.  In the ED CT head featured stable extensive encephalomalacia and no acute findings. Phenytoin level was undetectable. Neurology was consulted and patient was loaded with Vimpat and phenytoin. She had generalized seizures in the ED that were aborted with Ativan.                                                                  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  She has been restarted on Dilantin and Vimpat combination, remains stable, head CT, MRI and EEG were all nonacute, she is back to her baseline will be discharged today with outpatient PCP and neurology follow-up.  Written seizure precautions and  driving restrictions given.   Sepsis secondary to UTItoxic encephalopathy- febrile with elevated lactate, urine culture now growing E. coli with toxic encephalopathy, she responded very well to 2 doses of IV Rocephin, will get 4 more days of oral Keflex and will be discharged home on it.  COPD- Quiescent presently   Neuropathy- Continue Neurontin and B12 supplementation .   Discharge diagnosis     Principal Problem:   Seizures ([place]) Active Problems:   Thrombocytopenia- chronic   COPD (chronic obstructive pulmonary disease) (HCC)   Sepsis secondary to UTI (HCC)   GERD (gastroesophageal reflux disease)   History of CVA (cerebrovascular accident)   Neuropathy    Discharge instructions    Discharge Instructions    Diet - low sodium heart healthy   Complete by:  As directed    Discharge instructions   Complete by:  As directed    Do not drive, operate heavy machinery, perform activities at heights, swimming or participation in water activities or provide baby  sitting services until you have seen by Primary MD or a Neurologist and advised to do so again.  Follow with Primary MD [name], MD and your Neurologist  in 5 days   Get CBC, CMP, Dilantin Level  -  checked  by Primary MD in 5  days   Activity: As tolerated with Full fall precautions use walker/cane & assistance as needed  Disposition Home    Diet: Heart Healthy   Special Instructions: If you have smoked or chewed Tobacco  in the last 2 yrs please stop smoking, stop any regular Alcohol  and or any Recreational drug use.  On your next visit with your primary care physician please Get Medicines reviewed and adjusted.  Please request your Prim.MD to go over all Hospital Tests and Procedure/Radiological results at the follow up, please get all Hospital records sent to your Prim MD by signing hospital release before you go home.  If you experience worsening of your admission symptoms, develop shortness of breath,  life threatening emergency, suicidal or homicidal thoughts you must seek medical attention immediately by calling 911 or calling your MD immediately  if symptoms less severe.  You Must read complete instructions/literature along with all the possible adverse reactions/side effects for all the Medicines you take and that have been prescribed to you. Take any new Medicines after you have completely understood and accpet all the possible adverse reactions/side effects.   Increase activity slowly   Complete by:  As directed       Discharge Medications   Allergies as of 06/01/2018      Reactions   Zonisamide Other (See Comments)   Numbness and tingling over whole body   Codeine Nausea And Vomiting   Keppra [levetiracetam] Other (See Comments)   Causes seizures   Pregabalin Nausea And Vomiting   Olive Oil Rash      Medication List    TAKE these medications   acetaminophen 500 MG tablet Commonly known as:  TYLENOL Take 500 mg by mouth every 6 (six) hours as needed (for pain).   albuterol 108 (90 Base) MCG/ACT inhaler Commonly known as:  PROVENTIL HFA;VENTOLIN HFA Inhale 2 puffs into the lungs every 6 (six) hours as needed for wheezing or shortness of breath.   cephALEXin 500 MG capsule Commonly known as:  KEFLEX Take 1 capsule (500 mg total) by mouth 3 (three) times daily for 4 days.   gabapentin 300 MG capsule Commonly known as:  NEURONTIN Take 1 capsule (300 mg total) by mouth 2 (two) times daily.   lacosamide 200 MG Tabs tablet Commonly known as:  VIMPAT Take 1 tablet (200 mg total) by mouth 2 (two) times daily.   phenytoin 30 MG ER capsule Commonly known as:  DILANTIN Take 5 capsules (150 mg total) by mouth 2 (two) times daily.   vitamin B-12 250 MCG tablet Commonly known as:  CYANOCOBALAMIN Take 1 tablet (250 mcg total) by mouth daily.            Durable Medical Equipment  (From admission, onward)         Start     Ordered   05/31/18 1051  For home use  only DME Walker rolling  Once    Comments:  5 wheel  Question:  Patient needs a walker to treat with the following condition  Answer:  Weakness   05/31/18 1051          Follow-up Information    [name], MD. Schedule an appointment  as soon as possible for a visit in 5 day(s).   Specialty:  Internal Medicine Why:  3-5 days Contact information: [place] [address] [phone]        [facility]. Schedule an appointment as soon as possible for a visit in 1 week(s).   Specialty:  Neurology Why:  seizures, make sure labs are drawn: CBC, CMP, Dilantin Contact information: [address] [place], [place] Follow up.   Specialty:  Home Health Services Why:  Home health physical therapy Contact information: [address] [place] [address] [phone]           Major procedures and Radiology Reports - PLEASE review detailed and final reports thoroughly  -     EEG - No seizures noted.  Dg Chest 2 View  Result Date: 05/23/2018 CLINICAL DATA:  Central chest pain radiating to left arm. EXAM: CHEST - 2 VIEW COMPARISON:  Body CT 03/24/2018 FINDINGS: Cardiomediastinal silhouette is normal. Mediastinal contours appear intact. Calcific atherosclerotic disease the aorta. There is no evidence of focal airspace consolidation, pleural effusion or pneumothorax. Minimal right middle lobe atelectasis. Emphysematous changes and hyperinflation of the lungs. Osseous structures are without acute abnormality. Exaggerated thoracic kyphosis. Posttraumatic deformity of the right clavicle. Soft tissues are grossly normal. IMPRESSION: Emphysematous changes and hyperinflation of the lungs. Improved right middle lobe atelectasis. Calcific atherosclerotic disease of the aorta. Electronically Signed   By: Fidela Salisbury M.D.   On: 05/23/2018 17:30   Ct Head  Wo Contrast  Result Date: 05/29/2018 CLINICAL DATA:  Seizures with apparent fall EXAM: CT HEAD WITHOUT CONTRAST CT CERVICAL SPINE WITHOUT CONTRAST TECHNIQUE: Multidetector CT imaging of the head and cervical spine was performed following the standard protocol without intravenous contrast. Multiplanar CT image reconstructions of the cervical spine were also generated. COMPARISON:  Cervical spine CT August 05, 2017; brain MRI March 20, 2018; cervical spine MRI March 20, 2018; head CT May 24, 2018 FINDINGS: CT HEAD FINDINGS Brain: The ventricles are normal in size and configuration. There is extensive encephalomalacia in the right parieto-occipital region, stable. This encephalomalacia connects to the atrium of the lateral ventricle on the right with enlargement of this portion of the right lateral ventricle, stable. There is no new encephalomalacia. There is no evident mass, hemorrhage, extra-axial fluid collection, or midline shift. Brain parenchyma elsewhere appears unremarkable. No acute infarct is demonstrable. Vascular: No hyperdense vessel. There are foci of calcification in each distal vertebral artery and carotid siphon region. Skull: Patient is status post right-sided craniotomy defects, stable. No new bony defects are evident. Sinuses/Orbits: There is mucosal thickening and opacification in several ethmoid air cells. There is opacification of much of the right maxillary antrum with an air-fluid level in this area. There is mucosal thickening in the inferior right frontal sinus. Orbits appear symmetric bilaterally. Other: Mastoid air cells on the right are clear. There is mastoid disease on the left, stable. CT CERVICAL SPINE FINDINGS Alignment: There is no appreciable spondylolisthesis. Skull base and vertebrae: Skull base and craniocervical junction regions appear normal. There is no appreciable fracture. There are no blastic or lytic bone lesions. Soft tissues and spinal canal: Prevertebral soft  tissues and predental space regions are normal. There is no paraspinous lesion. There is no evident cord or canal hematoma. Disc levels: There is moderate disc space narrowing at C5-6, C6-7, and C7-T1. Anterior osteophytes are noted  at C5, C6, and C7. There is facet hypertrophy at several levels bilaterally. There is exit foraminal narrowing due to bony hypertrophy at C5-6 on the right with impression on the exiting nerve root at this level due to bony hypertrophy. There is no frank disc extrusion or high-grade stenosis. Upper chest: Visualized upper lung regions are clear. Other: There is calcification in each carotid artery. IMPRESSION: CT head: Extensive encephalomalacia in the right parieto-occipital region with involvement of the atrium of the right lateral ventricle, stable. No new encephalomalacia. No acute infarct. There is no mass or hemorrhage. Postoperative changes noted in the right calvarium, stable. Foci of arterial vascular calcification noted. Areas of paranasal sinus disease at multiple sites, most severe in the right maxillary antrum. 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 CT cervical spine: No evident fracture or spondylolisthesis. Osteoarthritic change at multiple levels with impression on the exiting nerve root on the right at C5-6 due to bony hypertrophy. No frank disc extrusion or stenosis. There are foci of carotid artery calcification bilaterally. Fluid in the upper esophagus may be indicative of gastroesophageal reflux. Electronically Signed   By: Lowella Grip III M.D.   On: 05/29/2018 21:38   Ct Head Wo Contrast  Result Date: 05/24/2018 CLINICAL DATA:  Ataxia. EXAM: CT HEAD WITHOUT CONTRAST TECHNIQUE: Contiguous axial images were obtained from the base of the skull through the vertex without intravenous contrast. COMPARISON:  CT 03/19/2018 FINDINGS: Brain: Large area of encephalomalacia in the posterior right cerebral hemisphere, stable. No acute intracranial abnormality.  Specifically, no hemorrhage, hydrocephalus, mass lesion, acute infarction, or significant intracranial injury. Vascular: No hyperdense vessel or unexpected calcification. Skull: No acute calvarial abnormality. Right parietal craniotomy, stable. Sinuses/Orbits: Visualized paranasal sinuses and mastoids clear. Orbital soft tissues unremarkable. Other: None IMPRESSION: Large area of encephalomalacia in the posterior right cerebral hemisphere. No acute intracranial abnormality. Electronically Signed   By: Rolm Baptise M.D.   On: 05/24/2018 22:25   Ct Angio Chest Pe W And/or Wo Contrast  Result Date: 05/23/2018 CLINICAL DATA:  Chest pain. EXAM: CT ANGIOGRAPHY CHEST WITH CONTRAST TECHNIQUE: Multidetector CT imaging of the chest was performed using the standard protocol during bolus administration of intravenous contrast. Multiplanar CT image reconstructions and MIPs were obtained to evaluate the vascular anatomy. CONTRAST:  113mL ISOVUE-370 IOPAMIDOL (ISOVUE-370) INJECTION 76% COMPARISON:  Radiographs of same day.  CT scan of March 24, 2018. FINDINGS: Cardiovascular: Satisfactory opacification of the pulmonary arteries to the segmental level. No evidence of pulmonary embolism. Normal heart size. No pericardial effusion. Atherosclerosis of thoracic aorta is noted without aneurysm or dissection. Mediastinum/Nodes: No enlarged mediastinal, hilar, or axillary lymph nodes. Thyroid gland, trachea, and esophagus demonstrate no significant findings. Lungs/Pleura: Lungs are clear. No pleural effusion or pneumothorax. Upper Abdomen: No acute abnormality. Musculoskeletal: No chest wall abnormality. No acute or significant osseous findings. Review of the MIP images confirms the above findings. IMPRESSION: No definite evidence of pulmonary embolus. No acute abnormality seen in the chest. Aortic Atherosclerosis (ICD10-I70.0). Electronically Signed   By: Marijo Conception, M.D.   On: 05/23/2018 19:37   Ct Cervical Spine Wo  Contrast  Result Date: 05/29/2018 CLINICAL DATA:  Seizures with apparent fall EXAM: CT HEAD WITHOUT CONTRAST CT CERVICAL SPINE WITHOUT CONTRAST TECHNIQUE: Multidetector CT imaging of the head and cervical spine was performed following the standard protocol without intravenous contrast. Multiplanar CT image reconstructions of the cervical spine were also generated. COMPARISON:  Cervical spine CT August 05, 2017; brain MRI March 20, 2018; cervical  spine MRI March 20, 2018; head CT May 24, 2018 FINDINGS: CT HEAD FINDINGS Brain: The ventricles are normal in size and configuration. There is extensive encephalomalacia in the right parieto-occipital region, stable. This encephalomalacia connects to the atrium of the lateral ventricle on the right with enlargement of this portion of the right lateral ventricle, stable. There is no new encephalomalacia. There is no evident mass, hemorrhage, extra-axial fluid collection, or midline shift. Brain parenchyma elsewhere appears unremarkable. No acute infarct is demonstrable. Vascular: No hyperdense vessel. There are foci of calcification in each distal vertebral artery and carotid siphon region. Skull: Patient is status post right-sided craniotomy defects, stable. No new bony defects are evident. Sinuses/Orbits: There is mucosal thickening and opacification in several ethmoid air cells. There is opacification of much of the right maxillary antrum with an air-fluid level in this area. There is mucosal thickening in the inferior right frontal sinus. Orbits appear symmetric bilaterally. Other: Mastoid air cells on the right are clear. There is mastoid disease on the left, stable. CT CERVICAL SPINE FINDINGS Alignment: There is no appreciable spondylolisthesis. Skull base and vertebrae: Skull base and craniocervical junction regions appear normal. There is no appreciable fracture. There are no blastic or lytic bone lesions. Soft tissues and spinal canal: Prevertebral soft  tissues and predental space regions are normal. There is no paraspinous lesion. There is no evident cord or canal hematoma. Disc levels: There is moderate disc space narrowing at C5-6, C6-7, and C7-T1. Anterior osteophytes are noted at C5, C6, and C7. There is facet hypertrophy at several levels bilaterally. There is exit foraminal narrowing due to bony hypertrophy at C5-6 on the right with impression on the exiting nerve root at this level due to bony hypertrophy. There is no frank disc extrusion or high-grade stenosis. Upper chest: Visualized upper lung regions are clear. Other: There is calcification in each carotid artery. IMPRESSION: CT head: Extensive encephalomalacia in the right parieto-occipital region with involvement of the atrium of the right lateral ventricle, stable. No new encephalomalacia. No acute infarct. There is no mass or hemorrhage. Postoperative changes noted in the right calvarium, stable. Foci of arterial vascular calcification noted. Areas of paranasal sinus disease at multiple sites, most severe in the right maxillary antrum. 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 CT cervical spine: No evident fracture or spondylolisthesis. Osteoarthritic change at multiple levels with impression on the exiting nerve root on the right at C5-6 due to bony hypertrophy. No frank disc extrusion or stenosis. There are foci of carotid artery calcification bilaterally. Fluid in the upper esophagus may be indicative of gastroesophageal reflux. Electronically Signed   By: Lowella Grip III M.D.   On: 05/29/2018 21:38   Mr Brain Wo Contrast  Result Date: 05/31/2018 CLINICAL DATA:  Confusion, seizure. History of intracranial hemorrhage, seizures, brain surgery. EXAM: MRI HEAD WITHOUT CONTRAST TECHNIQUE: Multiplanar, multiecho pulse sequences of the brain and surrounding structures were obtained without intravenous contrast. COMPARISON:  CT HEAD May 29, 2018 and MRI of the head March 20, 2018  FINDINGS: Mild motion degraded examination. INTRACRANIAL CONTENTS: No reduced diffusion to suggest acute ischemia. No susceptibility artifact to suggest hemorrhage. RIGHT parietoccipital encephalomalacia with porencephaly, ex vacuo dilatation RIGHT lateral ventricle. No hydrocephalus. No suspicious parenchymal signal. No mass or mass effect. No abnormal extra-axial fluid collections. VASCULAR: Normal major intracranial vascular flow voids present at skull base. SKULL AND UPPER CERVICAL SPINE: No abnormal sellar expansion. No suspicious calvarial bone marrow signal. Old RIGHT craniotomy. Craniocervical junction maintained. SINUSES/ORBITS: Severe  RIGHT maxillary sinus mucosal thickening. Mild ethmoid mucosal thickening. Small bilateral mastoid effusions. Included ocular globes and orbital contents are non-suspicious. OTHER: Patient is edentulous. IMPRESSION: 1. Mildly motion degraded examination. No acute intracranial process. 2. Unchanged RIGHT parietoccipital encephalomalacia. Electronically Signed   By: Elon Alas M.D.   On: 05/31/2018 19:30   Dg Chest Port 1 View  Result Date: 05/29/2018 CLINICAL DATA:  Fever EXAM: PORTABLE CHEST 1 VIEW COMPARISON:  None. FINDINGS: The heart size and mediastinal contours are within normal limits. Both lungs are clear. The visualized skeletal structures are unremarkable. IMPRESSION: No active disease. Electronically Signed   By: Ulyses Jarred M.D.   On: 05/29/2018 21:47    Micro Results    Recent Results (from the past 240 hour(s))  Urine culture     Status: Abnormal (Preliminary result)   Collection Time: 05/29/18 10:23 PM  Result Value Ref Range Status   Specimen Description URINE, RANDOM  Final   Special Requests   Final    NONE Performed at [facility], [address], [place], [place] [address]    Culture >=100,000 COLONIES/mL ESCHERICHIA COLI (A)  Final   Report Status PENDING  Incomplete  Blood Culture (routine x 2)     Status: None  (Preliminary result)   Collection Time: 05/29/18 10:40 PM  Result Value Ref Range Status   Specimen Description BLOOD RIGHT WRIST  Final   Special Requests   Final    BOTTLES DRAWN AEROBIC AND ANAEROBIC Blood Culture results may not be optimal due to an inadequate volume of blood received in culture bottles   Culture   Final    NO GROWTH 2 DAYS Performed at [facility], [place] [address] [place], [place] [address]    Report Status PENDING  Incomplete  Blood Culture (routine x 2)     Status: None (Preliminary result)   Collection Time: 05/30/18 12:36 AM  Result Value Ref Range Status   Specimen Description BLOOD RIGHT HAND  Final   Special Requests   Final    BOTTLES DRAWN AEROBIC AND ANAEROBIC Blood Culture results may not be optimal due to an inadequate volume of blood received in culture bottles   Culture   Final    NO GROWTH 1 DAY Performed at [facility], [place] [address], [place], [place] [address]    Report Status PENDING  Incomplete    Today   Subjective    Ronnell Makarewicz today has no headache,no chest abdominal pain,no new weakness tingling or numbness, feels much better wants to go home today.    Objective   Blood pressure 108/66, pulse 88, temperature 98 F (36.7 C), temperature source Oral, resp. rate 16, height 5\' 8"  (1.727 m), weight 83.9 kg, SpO2 92 %.   Intake/Output Summary (Last 24 hours) at 06/01/2018 0827 Last data filed at 05/31/2018 2200 Gross per 24 hour  Intake 1633 ml  Output -  Net 1633 ml    Exam Awake Alert, Oriented x 3, No new F.N deficits, Normal affect [place].AT,PERRAL Supple Neck,No JVD, No cervical lymphadenopathy appriciated.  Symmetrical Chest wall movement, Good air movement bilaterally, CTAB RRR,No Gallops,Rubs or new Murmurs, No Parasternal Heave +ve B.Sounds, Abd Soft, Non tender, No organomegaly appriciated, No rebound -guarding or rigidity. No Cyanosis, Clubbing or edema, No new Rash or bruise   Data Review   CBC w  Diff:  Lab Results  Component Value Date   WBC 6.8 05/31/2018   HGB 13.2 05/31/2018  HCT 40.5 05/31/2018   HCT 38.5 02/17/2017   PLT 130 (L) 05/31/2018   LYMPHOPCT 10 05/29/2018   MONOPCT 4 05/29/2018   EOSPCT 0 05/29/2018   BASOPCT 0 05/29/2018    CMP:  Lab Results  Component Value Date   NA 141 06/01/2018   NA 145 (H) 06/10/2013   K 3.5 06/01/2018   CL 105 06/01/2018   CO2 28 06/01/2018   BUN 9 06/01/2018   BUN 13 06/10/2013   CREATININE 0.68 06/01/2018   PROT 6.1 (L) 05/31/2018   PROT 6.1 06/10/2013   ALBUMIN 3.4 (L) 05/31/2018   ALBUMIN 4.5 06/10/2013   BILITOT 1.0 05/31/2018   ALKPHOS 45 05/31/2018   AST 23 05/31/2018   ALT 14 05/31/2018  .   Total Time in preparing paper work, data evaluation and todays exam - 3 minutes  Lala Lund M.D on 06/01/2018 at 8:27 AM  [facility]   Office  [phone]

## 2018-06-01 ENCOUNTER — Ambulatory Visit: Payer: Self-pay | Admitting: Pharmacist

## 2018-06-01 ENCOUNTER — Telehealth: Payer: Self-pay | Admitting: Neurology

## 2018-06-01 DIAGNOSIS — R4182 Altered mental status, unspecified: Secondary | ICD-10-CM | POA: Diagnosis not present

## 2018-06-01 DIAGNOSIS — R569 Unspecified convulsions: Secondary | ICD-10-CM | POA: Diagnosis not present

## 2018-06-01 DIAGNOSIS — J449 Chronic obstructive pulmonary disease, unspecified: Secondary | ICD-10-CM | POA: Diagnosis not present

## 2018-06-01 DIAGNOSIS — A419 Sepsis, unspecified organism: Secondary | ICD-10-CM | POA: Diagnosis not present

## 2018-06-01 LAB — BASIC METABOLIC PANEL
Anion gap: 8 (ref 5–15)
BUN: 9 mg/dL (ref 6–20)
CHLORIDE: 105 mmol/L (ref 98–111)
CO2: 28 mmol/L (ref 22–32)
CREATININE: 0.68 mg/dL (ref 0.44–1.00)
Calcium: 8.8 mg/dL — ABNORMAL LOW (ref 8.9–10.3)
GFR calc Af Amer: >60 mL/min (ref >60)
GFR calc non Af Amer: >60 mL/min (ref >60)
Glucose, Bld: 98 mg/dL (ref 70–99)
Potassium: 3.5 mmol/L (ref 3.5–5.1)
Sodium: 141 mmol/L (ref 135–145)

## 2018-06-01 LAB — URINE CULTURE: Culture: >=100000 — AB

## 2018-06-01 LAB — MAGNESIUM: Magnesium: 2.1 mg/dL (ref 1.7–2.4)

## 2018-06-01 MED ORDER — CEPHALEXIN 500 MG PO CAPS: 500.0000 mg | ORAL_CAPSULE | Freq: Three times a day (TID) | ORAL | 0 refills | Status: AC

## 2018-06-01 NOTE — Telephone Encounter (Signed)
Patient's daughter is calling in stating that he mom will be released today from hospital and they said she needed a follow up with her neurologist in 5 days for a level check. The only thing open is tomorrow at 3 but she didn't know if she could take that because it made it sound like she needed to wait the whole 5 days. Please call her back at [phone] or let us know. Thanks!

## 2018-06-01 NOTE — Progress Notes (Signed)
Subjective: Continues to improve.   Exam: Vitals:   05/31/18 2340 06/01/18 0551  BP: (!) 109/53 108/66  Pulse:  88  Resp:  16  Temp:  98 F (36.7 C)  SpO2:  92%   Gen: In bed, NAD Resp: non-labored breathing, no acute distress Abd: soft, nt  Neuro: MS: She is oriented to month today, still cannot give me the year.  She has a much more fluid speech pattern today is able to answer more questions. CN:, Extraocular movements intact Motor: Moves all extremities well Sensory: Intact light touch  Labs: Urine culture-100,000 colonies of E. coli  Impression: 62 year old female with breakthrough seizures in the setting of stopping Dilantin and urinary tract infection.  She has had mild delirium/encephalopathy which is improving since arrival.  I suspect that this is multifactorial including UTI, prolonged seizures, possible mild concussion.  It is possible that she sustained some injury from prolonged seizures, but with her continuing to improve I think that her prognosis is fairly good.  Recommendations: 1) continue AEDs 2) follow-up with her regular outpatient neurologist.  Roland Rack, MD [facility] [phone]  If 7pm- 7am, please page neurology on call as listed in [place].

## 2018-06-01 NOTE — Telephone Encounter (Signed)
Spoke with pt's daughter, Olivia Mackie.11/0/19 @ 2:30PM offered.  This time does not work for [employer] as she has to pick her children up from school.  Appointment made for Monday 06/07/18 @ 1PM.    Olivia Mackie asked why our office requires an appointment for just labs (in regards to pt's hospital follow up appointment 05/31/18)  I advised that we do not require an appointment but that [name] wanted to see pt in office due to her recent hospital admittance (05/23/18)  Olivia Mackie verbalized understanding.   Olivia Mackie goes on to state that pt is extremely confused right now.  Pt forgot that daughter bought a house is July (pt lives with daughter), asked when her husband will be returning from work (husband has been deceased for 16 years).  Olivia Mackie states that pt is currently being treated for UTI.  I advised that with memory pt's, any type of infection can exaggerate symptoms.

## 2018-06-01 NOTE — Progress Notes (Signed)
Nsg Discharge Note  Admit Date:  05/29/2018 Discharge date: 06/01/2018   [name] to be D/C'd Home per MD order.  AVS completed.  Copy for chart, and copy for patient signed, and dated. Patient/caregiver able to verbalize understanding.  Discharge Medication: Allergies as of 06/01/2018      Reactions   Zonisamide Other (See Comments)   Numbness and tingling over whole body   Codeine Nausea And Vomiting   Keppra [levetiracetam] Other (See Comments)   Causes seizures   Pregabalin Nausea And Vomiting   Olive Oil Rash      Medication List    TAKE these medications   acetaminophen 500 MG tablet Commonly known as:  TYLENOL Take 500 mg by mouth every 6 (six) hours as needed (for pain).   albuterol 108 (90 Base) MCG/ACT inhaler Commonly known as:  PROVENTIL HFA;VENTOLIN HFA Inhale 2 puffs into [name] lungs every 6 (six) hours as needed for wheezing or shortness of breath.   cephALEXin 500 MG capsule Commonly known as:  KEFLEX Take 1 capsule (500 mg total) by mouth 3 (three) times daily for 4 days.   gabapentin 300 MG capsule Commonly known as:  NEURONTIN Take 1 capsule (300 mg total) by mouth 2 (two) times daily.   lacosamide 200 MG Tabs tablet Commonly known as:  VIMPAT Take 1 tablet (200 mg total) by mouth 2 (two) times daily.   phenytoin 30 MG ER capsule Commonly known as:  DILANTIN Take 5 capsules (150 mg total) by mouth 2 (two) times daily.   vitamin B-12 250 MCG tablet Commonly known as:  CYANOCOBALAMIN Take 1 tablet (250 mcg total) by mouth daily.            Durable Medical Equipment  (From admission, onward)         Start     Ordered   05/31/18 1051  For home use only DME Walker rolling  Once    Comments:  5 wheel  Question:  Patient needs a walker to treat with [name] following condition  Answer:  Weakness   05/31/18 1051          Discharge Assessment: Vitals:   05/31/18 2340 06/01/18 0551  BP: (!) 109/53 108/66  Pulse:  88  Resp:  16   Temp:  98 F (36.7 C)  SpO2:  92%   Skin clean, dry and intact without evidence of skin break down, no evidence of skin tears noted. IV catheter discontinued intact. Site without signs and symptoms of complications - no redness or edema noted at insertion site, patient denies c/o pain - only slight tenderness at site.  Dressing with slight pressure applied.  D/c Instructions-Education: Discharge instructions given to patient/family with verbalized understanding. D/c education completed with patient/family including follow up instructions, medication list, d/c activities limitations if indicated, with other d/c instructions as indicated by MD - patient able to verbalize understanding, all questions fully answered. Patient instructed to return to ED, call 911, or call MD for any changes in condition.  Patient escorted via [place], and D/C home via private auto.  Hiram Comber, RN 06/01/2018 9:35 AM

## 2018-06-01 NOTE — Telephone Encounter (Signed)
Ok, thanks.

## 2018-06-01 NOTE — Telephone Encounter (Signed)
Pt discharge instructions are to f/u with Neurology and PCP in 5 days, which will be Sunday 06/06/18.  You have a 1pm New pt slot on Monday 06/07/18 - OK to use?

## 2018-06-03 ENCOUNTER — Other Ambulatory Visit: Payer: Self-pay

## 2018-06-03 LAB — CULTURE, BLOOD (ROUTINE X 2): Culture: NO GROWTH

## 2018-06-03 NOTE — Patient Outreach (Signed)
[facility]) Care Management  06/03/2018  [name] 04-May-1956 [id number]   Follow up call to [name] regarding social work needs addressed last week. BSW inquired about whether or not she received transportation and food resources that were mailed on 05/28/18.  [name] was hospitalized from 05/29/18-05/31/18 and said that she has not had the opportunity to go through all of her mail. She did confirm receipt of [employer] meals that were ordered. She was unsure if someone from [name] has attempted to contact her regarding the face to face interview to determine eligibility.  BSW provided her with contact information.  BSW also called [name] and left voicemail message informing them that [name] has discharged from the hospital and can be contacted at this time.  BSW will follow up with [name] again next week to ensure receipt of resources mailed and find out the status of [name] eligibility.    [name], BSW Social Worker [phone]

## 2018-06-04 ENCOUNTER — Ambulatory Visit: Payer: Self-pay | Admitting: Pharmacist

## 2018-06-04 LAB — CULTURE, BLOOD (ROUTINE X 2): CULTURE: NO GROWTH

## 2018-06-07 ENCOUNTER — Encounter: Payer: Self-pay | Admitting: Neurology

## 2018-06-07 ENCOUNTER — Ambulatory Visit: Payer: Medicare HMO | Admitting: Neurology

## 2018-06-07 ENCOUNTER — Other Ambulatory Visit: Payer: Self-pay

## 2018-06-07 VITALS — BP 110/66 | HR 82 | Ht 67.75 in | Wt 124.0 lb

## 2018-06-07 DIAGNOSIS — F419 Anxiety disorder, unspecified: Secondary | ICD-10-CM | POA: Diagnosis not present

## 2018-06-07 DIAGNOSIS — G40219 Localization-related (focal) (partial) symptomatic epilepsy and epileptic syndromes with complex partial seizures, intractable, without status epilepticus: Secondary | ICD-10-CM

## 2018-06-07 MED ORDER — ESCITALOPRAM OXALATE 10 MG PO TABS: 10.0000 mg | ORAL_TABLET | Freq: Every day | ORAL | 6 refills | Status: DC

## 2018-06-07 NOTE — Progress Notes (Signed)
NEUROLOGY FOLLOW UP OFFICE NOTE  KHALIYAH NORTHROP [id number] 03-16-1956  HISTORY OF PRESENT ILLNESS: I had the pleasure of seeing [name] in follow-up in the neurology clinic on 06/07/2018.  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 She is accompanied by her daughter who helps supplement the history today.  Records and images were personally reviewed where available. She was admitted for palpitations and chest pain on 05/23/18. During that time, she was noted to have gait instability with an elevated Dilantin level of 26. She was instructed to hold dose until re-check on 10/24, however she was back in the ER on 05/29/18 after found poorly responsive at home. Dilantin level was undetectable. She was also found to have a UTI. She had an EEG which was limited by artifact but appeared normal. She had an MRI brain without contrast on 05/31/18 with no acute changed with unchanged right parieto-occipital encephalomalacia. She was discharged home with instructions to take Dilantin 30mg  5 capsules BID (150mg  BID), but her daughter reports that she has been giving her original home dose of Dilantin 160mg  in AM, 200mg  in PM, Vimpat 200mg  BID, and Gabapentin 300mg  2 caps qhs. Her daughter reports that she would go months without a seizure on this regimen in the past, and that stress is always a trigger for her seizures. She had actually been doing fine with no seizures for almost a month prior to the seizure on 10/26. Her daughter reports there was some stress going on around this time, but states that the patient is pretty good with managing her own medications. Her daughter has only been helping her since last week hospital discharge because she has had a lot of cognitive issues since then. She did not remember the man she has been dating the past 6 years. Her daughter reports that since hospital discharge, she is a "blank person." She is always tired with no appetite. She has become more  fixated with what happened that day, her daughter asks for anxiety medication because she would work herself up. She had previously been given lorazepam by Dr. Gaynell Face.   On her initial visit, she was very worried about burning in her legs. She had an EMG/NCV of both legs which showed predominantly sensory axonal neuropathy, mild to moderate in degree, worse on the left. Gabapentin dose was increased for this.  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 She had been seeing epileptologist [name], records were reviewed and will be summarized as follows. In the past, seizures would start with headache and burning sensation of the left arm, which may be followed by left arm jerking. Over time, she has not had any prior warning symptoms. She has had seizures where she is noted to have kicking and rocking movements of the pelvis with unresponsiveness. She was in the EMU in 10/2012 where 11 seizures were captured from the right posterior hemisphere/right inferior temporal region. Interictal EEG showed abundant right posterior temporal epileptiform discharges, frequent right anterior temporal epileptiform discharges, as well as occasional independent left anterior temporal epileptiform discharges. There was focal slowing over the right posterior hemisphere. MRI brain in 2014 showed right parietal/occipital encephalomalacia, right hippocampus smaller than left. PET scan in 2015 showed multiple areas of hypometabolism on the right, mostly corresponding to or  adjacent to the structural abnormality. Ictal SPECT showed right hemisphere hypoperfusion. SISCOM showed right frontal region significant on the subtraction. MEG in 07/2013 showed spikes localizing to the right occipital,  parietal, mesial temporal regions with a weak signal left mesial parietal. Concomitant EEG showed right posterior spikes. Functional mapping suggestive of left language lateralization. She has tried multiple AEDs in the past with various side effects or ineffective, and has been taking Dilantin 160mg  in AM, 200mg  in PM and Vimpat 200mg  BID with seizures around once a month. She was admitted to [facility] on 03/19/18 for left leg weakness and drowsiness. She was fishing with her boyfriend when she suddenly could not move her left leg and almost fell. She states that her boyfriend told her that she was shaking and had a seizure while they were fishing. She was brought to [facility] where Dilantin level was 33.8. She was adamant that she has been taking her medications as instructed and did not take more than prescribed, no new medications taken. She had an MRI brain without contrast which I personally reviewed, no acute changes seen. Large volume right parieto-occipital encephalomalacia was unchanged. Right hippocampus is smaller. She continued to report weakness and had an MRI cervical and lumbar spine which did not show any myelopathy, there was prominent left-sided facet hypertrophy at C4-5 with resultant moderate left C5 foraminal stenosis, degenerative disc osteophyte at C5-6 with severe right C6 foraminal stenosis, and mild spinal stenosis at C5-6 and C6-7, chronic borderline to mild multifactorial spinal stenosis at L2-3 through L4-5 with no convincing lateral recess or foraminal stenosis. She continued to report burning pain in her legs and was started on low dose gabapentin 300mg  qhs with no side effects. Dilantin was held in the hospital, she was discharged on a lower dose, but states that she has been taking her pre-hospital dose of 160mg  in AM, 200mg  in PM but has had 4 seizures since hospital discharge 2 weeks ago, which is unusual. She had 2 seizures in one day in the car with her mother-in-law, her mother-in-law had  to pull over. The next day she had 2 at home, her daughter found her in the bathroom. She has not been sleeping well the past few days. She has a headache after the seizures. Aside from the convulsions, she has episodes where a feeling comes over her left side, from the arm down her leg, then she is weaker after. She denies any jerking. She is quite anxious in the office today and perseverates on her symptoms. She states she "stays nervous." She reports taking prn Ativan in the past, but discontinued at [facility]. She repeatedly states her feet are numb and they "feel like they are grabbed." She feels gabapentin has helped some with this.   Prior AEDs: phenobarbital (ineffective, mood issues), Depakote (multiple trials, ineffective), Keppra (ineffective), Zonisamide (side effects on brief trial of higher dose than prescribed - 500mg  instead of 200mg ), Onfi (mouth sores), Tegretol (ineffective), Lamictal (nausea, dizziness, malaise), Felbatol (nausea, dizziness, malaise), Topamax (ineffective), Tigabine (tried per notes, details unclear), Neurontin (ineffective, nausea), Lyrica (leg pain reported on 25mg  daily, tingling)  Epilepsy Risk Factors:  Right parieto-occipital AVM s/p rupture and hemorrhage in 1988, s/p craniectomy. Otherwise she had a normal birth and early development.  There is no history of febrile convulsions, CNS infections such as meningitis/encephalitis, or family history of seizures.   PAST MEDICAL HISTORY: Past Medical History:  Diagnosis Date  . Asthma   . Cerebral hemorrhage ([place]) 1989  . Chronic  back pain   . COPD (chronic obstructive pulmonary disease) ([place])   . DDD (degenerative disc disease) 06/12/2013  . DVT (deep venous thrombosis) ([place])    "she's had several since 1990"  . GERD (gastroesophageal reflux disease)   . Headache    "usually around time when she's had a seizure"  . History of blood transfusion    "when she was a baby"  . History of stomach ulcers   .  Kidney stones   . Neuropathy   . Seizures ([place])    "because of her brain surgery"  . Stroke [facility]) [phone]   family denies residual on 11/09/2014  . Tunnel vision    "since brain OR"    MEDICATIONS: Current Outpatient Medications on File Prior to Visit  Medication Sig Dispense Refill  . acetaminophen (TYLENOL) 500 MG tablet Take 500 mg by mouth every 6 (six) hours as needed (for pain).    Marland Kitchen albuterol (PROVENTIL HFA;VENTOLIN HFA) 108 (90 Base) MCG/ACT inhaler Inhale 2 puffs into the lungs every 6 (six) hours as needed for wheezing or shortness of breath.    . gabapentin (NEURONTIN) 300 MG capsule Take 1 capsule (300 mg total) by mouth 2 (two) times daily.    Marland Kitchen lacosamide (VIMPAT) 200 MG TABS tablet Take 1 tablet (200 mg total) by mouth 2 (two) times daily. 180 tablet 3  . phenytoin (DILANTIN) 30 MG ER capsule Take 5 capsules (150 mg total) by mouth 2 (two) times daily (Taking 160mg  in AM, 200mg  in PM) 60 capsule 0  . vitamin B-12 (CYANOCOBALAMIN) 250 MCG tablet Take 1 tablet (250 mcg total) by mouth daily.     No current facility-administered medications on file prior to visit.     ALLERGIES: Allergies  Allergen Reactions  . Zonisamide Other (See Comments)    Numbness and tingling over whole body  . Codeine Nausea And Vomiting  . Keppra [Levetiracetam] Other (See Comments)    Causes seizures  . Pregabalin Nausea And Vomiting  . Olive Oil Rash    FAMILY HISTORY: Family History  Problem Relation Age of Onset  . Heart attack Mother        4 MIs, started in her 23s, also vaginal cancer and colon cancer  . Cancer Mother   . Heart attack Father        Died suddenly age 66 - autopsy revealed heart attack her patient  . Cancer Brother        Sinus cancer    SOCIAL HISTORY: Social History   Socioeconomic History  . Marital status: Divorced    Spouse name: Not on file  . Number of children: Not on file  . Years of education: Not on file  . Highest education level: Not on file    Occupational History  . Not on file  Social Needs  . Financial resource strain: Not on file  . Food insecurity:    Worry: Not on file    Inability: Not on file  . Transportation needs:    Medical: Not on file    Non-medical: Not on file  Tobacco Use  . Smoking status: Former Smoker    Packs/day: 0.50    Years: 20.00    Pack years: 10.00    Types: Cigarettes    Last attempt to quit: 10/26/2017    Years since quitting: 0.6  . Smokeless tobacco: Never Used  . Tobacco comment: Previously smoked 2 ppd, down to 4 cigs/day  Substance and Sexual Activity  .  Alcohol use: No  . Drug use: No  . Sexual activity: Never    Birth control/protection: Post-menopausal  Lifestyle  . Physical activity:    Days per week: Not on file    Minutes per session: Not on file  . Stress: Not on file  Relationships  . Social connections:    Talks on phone: Not on file    Gets together: Not on file    Attends religious service: Not on file    Active member of club or organization: Not on file    Attends meetings of clubs or organizations: Not on file    Relationship status: Not on file  . Intimate partner violence:    Fear of current or ex partner: Not on file    Emotionally abused: Not on file    Physically abused: Not on file    Forced sexual activity: Not on file  Other Topics Concern  . Not on file  Social History Narrative   Pt lives in 2 story home with her daughter, daughter's family and pt's mother-in-law   Has 2 living children / 1 deceased   November 06, 2022 grade education   Worked for [employer] until 1998 when she became disabled.     REVIEW OF SYSTEMS: Constitutional: No fevers, chills, or sweats, no generalized fatigue, change in appetite Eyes: No visual changes, double vision, eye pain Ear, nose and throat: No hearing loss, ear pain, nasal congestion, sore throat Cardiovascular: No chest pain, palpitations Respiratory:  No shortness of breath at rest or with exertion,  wheezes GastrointestinaI: No nausea, vomiting, diarrhea, abdominal pain, fecal incontinence Genitourinary:  No dysuria, urinary retention or frequency Musculoskeletal:  No neck pain, back pain Integumentary: No rash, pruritus, skin lesions Neurological: as above Psychiatric: No depression, insomnia, +anxiety Endocrine: No palpitations, fatigue, diaphoresis, mood swings, change in appetite, change in weight, increased thirst Hematologic/Lymphatic:  No anemia, purpura, petechiae. 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 No aphasia or dysarthria. Fund of knowledge is appropriate.  Recent and remote memory are intact.  Attention and concentration are normal.    Able to name objects and repeat phrases.Cranial nerves: CN I: not tested CN II: pupils equal, round and reactive to light, left homonymous hemianopia (similar to prior) CN III, IV, VI:  full range of motion, no nystagmus, no ptosis CN VII: upper and lower face symmetric Bulk & Tone: normal, no fasciculations Motor: 5/5 throughout with no pronator drift. Sensation: intact to light touch Deep Tendon Reflexes: +2 on right UE and LE, left UE, brisk +3 on left LE, no ankle clonus Plantar responses: downgoing bilaterally Cerebellar: no incoordination on finger to nose testing Gait: narrow-based and steady, able to tandem walk adequately. Tremor: head tremor with neck dystonic posturing (head turned to the right). 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  She has tried multiple AEDs in the past, and has been on Dilantin 160mg  in AM, 200mg  in PM and Vimpat 200mg  BID with around 1 seizure a month, doing well on this regimen until recently. She has had 2 admissions for Dilantin toxicity, her daughter is adamant she is good with taking her medications independently, but was instructed to monitor more closely. She is back to home doses of medications, she had a seizure recently after Dilantin was held for toxicity and in the setting of a UTI as well. No seizures since hospital discharge but she is more cognitively impaired since then, re-check Dilantin level, EEG will be ordered. Her MRI brain did not show any acute changes. Cognitive issues may be due to recent seizures, she also appears depressed, her daughter reports seizures triggered by stress/anxiety. She is agreeable to starting Lexapro 10mg  daily, side effects discussed. We had an extensive discussion with the patient and her daughter about intractable epilepsy and other options aside from medications, her daughter has had these discussions in the past and states her mother is very fearful of potential surgical risks. Continue Dilantin 160mg  in AM, 200mg  in PM, Vimpat 200mg  BID, and Gabapentin 600mg  qhs. Follow-up in 3 months, she knows to call for any changes.   Thank you for allowing me to participate in her care.  Please do not hesitate to call for any questions or concerns.  The duration of this appointment visit was 30 minutes of face-to-face time with the patient.  Greater than 50% of this time was spent in counseling, explanation of diagnosis, planning of further management, and coordination of care.   Ellouise Newer,  M.D.   CC: Dr. Maudie Mercury

## 2018-06-07 NOTE — Patient Instructions (Addendum)
1. Check Dilantin level first thing in the morning  Your provider has requested that you have LABS drawn in the FUTURE.  Our laboratory is located within [facility], [phone] of this building.  You do not need an appointment however, please come to [place] prior to going to the lab - we will check you into the lab.     2. Start Lexapro 10mg  every night 3. Continue all your current medications (Dilantin 160mg  in AM, 200mg  in PM, Vimpat 200mg  twice a day, Gabapentin 300mg  2 caps every night 4. Follow-up in 3 months, call for any changes  Seizure Precautions: 1. If medication has been prescribed for you to prevent seizures, take it exactly as directed.  Do not stop taking the medicine without talking to your doctor first, even if you have not had a seizure in a long time.   2. Avoid activities in which a seizure would cause danger to yourself or to others.  Don't operate dangerous machinery, swim alone, or climb in high or dangerous places, such as on ladders, roofs, or girders.  Do not drive unless your doctor says you may.  3. If you have any warning that you may have a seizure, lay down in a safe place where you can't hurt yourself.    4.  No driving for 6 months from last seizure, as per [facility].   Please refer to the following link on the [place] website for more information: [email or web address]   5.  Maintain good sleep hygiene. Avoid alcohol.  6.  Contact your doctor if you have any problems that may be related to the medicine you are taking.  7.  Call 911 and bring the patient back to the ED if:        A.  The seizure lasts longer than 5 minutes.       B.  The patient doesn't awaken shortly after the seizure  C.  The patient has new problems such as difficulty seeing, speaking or moving  D.  The patient was injured during the seizure  E.  The patient has a temperature  over 102 F (39C)  F.  The patient vomited and now is having trouble breathing

## 2018-06-08 ENCOUNTER — Other Ambulatory Visit: Payer: Self-pay

## 2018-06-08 ENCOUNTER — Other Ambulatory Visit (INDEPENDENT_AMBULATORY_CARE_PROVIDER_SITE_OTHER): Payer: Medicare HMO

## 2018-06-08 DIAGNOSIS — G40219 Localization-related (focal) (partial) symptomatic epilepsy and epileptic syndromes with complex partial seizures, intractable, without status epilepticus: Secondary | ICD-10-CM

## 2018-06-08 DIAGNOSIS — G40909 Epilepsy, unspecified, not intractable, without status epilepticus: Secondary | ICD-10-CM

## 2018-06-08 DIAGNOSIS — R569 Unspecified convulsions: Secondary | ICD-10-CM

## 2018-06-09 ENCOUNTER — Ambulatory Visit (INDEPENDENT_AMBULATORY_CARE_PROVIDER_SITE_OTHER): Payer: Medicare HMO | Admitting: Neurology

## 2018-06-09 DIAGNOSIS — G40219 Localization-related (focal) (partial) symptomatic epilepsy and epileptic syndromes with complex partial seizures, intractable, without status epilepticus: Secondary | ICD-10-CM

## 2018-06-09 DIAGNOSIS — G40909 Epilepsy, unspecified, not intractable, without status epilepticus: Secondary | ICD-10-CM

## 2018-06-09 DIAGNOSIS — R569 Unspecified convulsions: Secondary | ICD-10-CM

## 2018-06-09 LAB — PHENYTOIN LEVEL, TOTAL: Phenytoin, Total: 12.8 mg/L (ref 10.0–20.0)

## 2018-06-09 NOTE — Procedures (Signed)
ELECTROENCEPHALOGRAM REPORT  Date of Study: 06/09/2018  Patient's Name: [name] MRN: [id number] Date of Birth: 1955-09-29  Referring Provider: Dr. 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 EEG to assess for subclinical seizures.  Medications: NEURONTIN 300 MG capsule  VIMPAT 200 MG TABS tablet DILANTIN 100 MG ER and 30mg  capsule TYLENOL 500 MG tablet  PROVENTIL HFA;VENTOLIN HFA 108 (90 Base) MCG/ACT inhaler  CYANOCOBALAMIN 250 MCG tablet   Technical Summary: A multichannel digital EEG recording measured by the international 10-20 system with electrodes applied with paste and impedances below 5000 ohms performed in our laboratory with EKG monitoring in an awake and asleep patient.  Hyperventilation was not performed. Photic stimulation was performed.  The digital EEG was referentially recorded, reformatted, and digitally filtered in a variety of bipolar and referential montages for optimal display.    Description: The patient is awake and asleep during the recording.  During maximal wakefulness, there is a asymmetric, medium voltage 8 Hz posterior dominant rhythm better formed over the left occipital region. There is polymorphic near-continuous theta slowing over the right posterior temporal region. Breach artifact with higher amplitude and sharply contoured activity is seen in this region. During drowsiness and sleep, there is an increase in theta slowing of the background.  Vertex waves and asymmetric sleep spindles with higher amplitude over the right were seen. Photic stimulation did not elicit any abnormalities.  There were occasional spikes over the right posterior temporal region. No electrographic seizures seen.    EKG lead was unremarkable.  Impression: This awake and asleep EEG is abnormal due to the presence of: 1. Focal slowing over the right  posterior temporal region 2. Occasional epileptiform discharges over the right posterior temporal region 3. Breach artifact in the right posterior temporal/occipital region  Clinical Correlation of the above findings indicates focal cerebral dysfunction over the right posterior temporal region suggestive of underlying structural or physiologic abnormality. There is a tendency for seizures to arise from this region. Breach artifact consistent with prior surgery in this region. No electrographic seizures seen. Clinical correlation is advised.    Ellouise Newer, M.D.

## 2018-06-10 ENCOUNTER — Telehealth: Payer: Self-pay

## 2018-06-10 ENCOUNTER — Other Ambulatory Visit: Payer: Self-pay

## 2018-06-10 NOTE — Telephone Encounter (Signed)
Spoke with pt's daughter, [name], relaying message below.

## 2018-06-10 NOTE — Telephone Encounter (Signed)
-----   Message from [name], MD sent at 06/09/2018  1:59 PM EST ----- Pls let daughter know the Dilantin is again within range, 12.8. Would continue to monitor how [name] is taking her medication. Her EEG did not show any evidence of continued silent seizures. Would give her more time to rest her brain as she recovers from the recent seizure. Thanks

## 2018-06-10 NOTE — Patient Outreach (Signed)
[facility]) Care Management  06/10/2018  [name] [birth year] [id number]  Outreach to ensure receipt of resources mailed on 05/28/18 and regarding [name].  BSW informed [name] that Stacy from [name] eligibility attempted to call her.  BSW provided [name] with her contact information and encouraged her to call to schedule the face to face interview.  [name] reported that her daughter checks/manages her mail so she was not sure if resources have been received.  She said that she would check with her daughter.  BSW will follow up with her again next week regarding [name] and resend resources if necessary.    [name], BSW Social Worker [phone]

## 2018-06-16 ENCOUNTER — Other Ambulatory Visit: Payer: Self-pay

## 2018-06-16 NOTE — Patient Outreach (Signed)
[place] [facility]) Care Management  06/16/2018  [name] 09-09-1955 [id number]   Follow up call to [name] regarding status of [name] services and to ensure receipt of resources mailed on 05/28/18.  [name] has not yet called [name] back to schedule the face to face interview reporting that she has not felt well enough to do so.  BSW informed her that the interview will likely have to be scheduled a couple of weeks out from the time of her call. BSW provided her with contact information for Courtney at [place] again.   [name] denied receipt of resources that were previously mailed so BSW agreed to send them again. [name] is interested in purchasing an "embrace2" device that is supposed to detect seizure symptoms and notify caregivers. She inquired about financial resources to assist with the purchase of the device as she cannot afford the $300 and it is not covered by her insurance. BSW is not aware of resources for this type of assistance but agreed to research it. BSW emailed contacts at [place] to inquire about assistance or suggestion of resources. BSW emailed social work Medical laboratory scientific officer for suggestions.  BSW Librarian, academic at [employer] that manufactures this device, to inquire about financial assistance.  BSW mailed a list of local financial resources to [name] but informed her that these resources may not assist with this particular type of request.  The following resources were mailed again: [employer] and Procedure Information [employer] [place] [employer] [place] will follow up with [name]  to ensure receipt of resources that were mailed again and to update on financial assistance resources if any are located.   [name], BSW Social Worker [phone]

## 2018-06-21 ENCOUNTER — Other Ambulatory Visit: Payer: Self-pay

## 2018-06-21 MED ORDER — ESCITALOPRAM OXALATE 10 MG PO TABS: 10.0000 mg | ORAL_TABLET | Freq: Every day | ORAL | 3 refills | Status: DC

## 2018-06-23 ENCOUNTER — Telehealth: Payer: Self-pay

## 2018-06-23 NOTE — Telephone Encounter (Signed)
Received request for refill of pt's dilantin from [place].  I need clarification.   Rx sent on 05/31/18 - 30mg  Capsules Sig = Take 5 caps BID  Notes from LOV with [name] on 06/07/18 state to take 160mg  AM and 200mg  PM.  [name], pls advise and I will send Rx. Thanks!

## 2018-06-24 MED ORDER — PHENYTOIN SODIUM EXTENDED 30 MG PO CAPS: ORAL_CAPSULE | ORAL | 3 refills | Status: DC

## 2018-06-24 MED ORDER — PHENYTOIN SODIUM EXTENDED 100 MG PO CAPS: ORAL_CAPSULE | ORAL | 3 refills | Status: DC

## 2018-06-24 NOTE — Telephone Encounter (Signed)
Dilantin 100mg  #270 with 3 refills Sig = Take 1 Cap AM and 2 Caps PM  Dilantin 30mg  #180 with 3 refills Sig = Take (2) Caps each morning with 100mg  Cap for total of 160mg   Sent to [facility]

## 2018-06-24 NOTE — Telephone Encounter (Signed)
On her last visit with daughter, we agreed to continue Dilantin 160mg  in AM (100mg  and 2 caps of 30mg ) and 200mg  in PM (2 caps of 100mg ). Thanks

## 2018-06-24 NOTE — Addendum Note (Signed)
Addended by: Lenny Pastel on: 06/24/2018 09:34 AM   Modules accepted: Orders

## 2018-06-25 ENCOUNTER — Ambulatory Visit: Payer: Self-pay

## 2018-06-25 ENCOUNTER — Other Ambulatory Visit: Payer: Self-pay

## 2018-06-25 NOTE — Patient Outreach (Signed)
[facility] [facility]) Care Management  06/25/2018  [name] 04-23-1956 [id number]   Follow up call to [name] to ensure receipt of resources mailed for second time on 06/16/18.  BSW also calling to inform her of financial resources found that may be able to assist with purchase of seizure alert device that she is wanting. BSW attempted to reach her twice today but calls went straight to voicemail and there was no option to leave message.  BSW will attempt to reach her again within four business days.   [name], BSW Social Worker [phone]

## 2018-06-29 ENCOUNTER — Other Ambulatory Visit: Payer: Self-pay

## 2018-06-29 NOTE — Patient Outreach (Addendum)
[facility]) Care Management  06/29/2018  [name] [birth year] [id number]   Follow up call to [name] regarding social work referral.  She has not received resources mailed to her because she is now living with her daughter.  BSW updated address in Epic and agreed to resend resources.  BSW also informed her of resources located that may be able to assist with purchase of seizure alert device.  [name] reported that her daughter located an agency with grant funding for this assistance but she could not recall the agency.  She reported that they made a donation and the agency is supposed to be sending the embrace2 device to her.  She did request that BSW send other resources located as well. The following was mailed:  [employer] information/Request for [employer] information for: The [employer] and [facility] [employer] [facility] [place] and Procedure Information [employer] [place] [employer] [place] will follow up next week to ensure receipt.  [name], BSW Social Worker [phone]

## 2018-07-09 ENCOUNTER — Other Ambulatory Visit: Payer: Self-pay

## 2018-07-09 NOTE — Patient Outreach (Signed)
[facility]) Care Management  07/09/2018  AVA TANGNEY 08/27/1955 [id number]   Follow up call to Ms. Wallis to ensure receipt of resources mailed again on 06/29/18.  Ms. Putman has not received resources.  BSW will check with her again next week and personally deliver resources to her residence if not received at that time.  [name], BSW Social Worker [phone]

## 2018-07-13 ENCOUNTER — Telehealth: Payer: Self-pay | Admitting: Neurology

## 2018-07-13 NOTE — Telephone Encounter (Signed)
Patient called to let [name] know that she had a Seizure today and one last week. She said she had recently started taking Escitalopram and then her Seizures started back. She also said her Left side is Numb. Please Call. Thanks

## 2018-07-13 NOTE — Telephone Encounter (Signed)
Returned call to pt.  No answer.  LMOM asking for return call.  

## 2018-07-14 ENCOUNTER — Ambulatory Visit: Payer: Self-pay | Admitting: Neurology

## 2018-07-14 ENCOUNTER — Ambulatory Visit: Payer: Self-pay

## 2018-07-14 ENCOUNTER — Other Ambulatory Visit: Payer: Self-pay

## 2018-07-14 NOTE — Patient Outreach (Signed)
[facility]) Care Management  07/14/2018  [name] 1955-08-20 [id number]   Follow up call to [name] to ensure receipt of resources mailed.  BSW left voicemail message.  Will attempt to reach her again within four business days.  [name], BSW Social Worker [phone]

## 2018-07-16 ENCOUNTER — Encounter (HOSPITAL_COMMUNITY): Payer: Self-pay | Admitting: Emergency Medicine

## 2018-07-16 ENCOUNTER — Other Ambulatory Visit: Payer: Self-pay

## 2018-07-16 ENCOUNTER — Emergency Department (HOSPITAL_COMMUNITY): Payer: Medicare HMO

## 2018-07-16 ENCOUNTER — Observation Stay (HOSPITAL_COMMUNITY)
Admission: EM | Admit: 2018-07-16 | Discharge: 2018-07-19 | Disposition: A | Discharge status: Home / Self Care | Payer: Medicare HMO | Attending: Internal Medicine | Admitting: Internal Medicine

## 2018-07-16 DIAGNOSIS — E538 Deficiency of other specified B group vitamins: Secondary | ICD-10-CM | POA: Diagnosis not present

## 2018-07-16 DIAGNOSIS — G9389 Other specified disorders of brain: Secondary | ICD-10-CM | POA: Insufficient documentation

## 2018-07-16 DIAGNOSIS — Z79899 Other long term (current) drug therapy: Secondary | ICD-10-CM | POA: Diagnosis not present

## 2018-07-16 DIAGNOSIS — Z87442 Personal history of urinary calculi: Secondary | ICD-10-CM | POA: Insufficient documentation

## 2018-07-16 DIAGNOSIS — R270 Ataxia, unspecified: Secondary | ICD-10-CM | POA: Insufficient documentation

## 2018-07-16 DIAGNOSIS — Z8719 Personal history of other diseases of the digestive system: Secondary | ICD-10-CM | POA: Insufficient documentation

## 2018-07-16 DIAGNOSIS — M5030 Other cervical disc degeneration, unspecified cervical region: Secondary | ICD-10-CM | POA: Insufficient documentation

## 2018-07-16 DIAGNOSIS — M2578 Osteophyte, vertebrae: Secondary | ICD-10-CM | POA: Diagnosis not present

## 2018-07-16 DIAGNOSIS — M48061 Spinal stenosis, lumbar region without neurogenic claudication: Secondary | ICD-10-CM | POA: Insufficient documentation

## 2018-07-16 DIAGNOSIS — Z87891 Personal history of nicotine dependence: Secondary | ICD-10-CM | POA: Diagnosis not present

## 2018-07-16 DIAGNOSIS — J90 Pleural effusion, not elsewhere classified: Secondary | ICD-10-CM | POA: Diagnosis not present

## 2018-07-16 DIAGNOSIS — M4802 Spinal stenosis, cervical region: Secondary | ICD-10-CM | POA: Insufficient documentation

## 2018-07-16 DIAGNOSIS — I69354 Hemiplegia and hemiparesis following cerebral infarction affecting left non-dominant side: Secondary | ICD-10-CM | POA: Diagnosis not present

## 2018-07-16 DIAGNOSIS — T420X5A Adverse effect of hydantoin derivatives, initial encounter: Secondary | ICD-10-CM | POA: Insufficient documentation

## 2018-07-16 DIAGNOSIS — R0902 Hypoxemia: Secondary | ICD-10-CM | POA: Diagnosis not present

## 2018-07-16 DIAGNOSIS — G40909 Epilepsy, unspecified, not intractable, without status epilepticus: Principal | ICD-10-CM

## 2018-07-16 DIAGNOSIS — Z8249 Family history of ischemic heart disease and other diseases of the circulatory system: Secondary | ICD-10-CM | POA: Insufficient documentation

## 2018-07-16 DIAGNOSIS — T420X1A Poisoning by hydantoin derivatives, accidental (unintentional), initial encounter: Secondary | ICD-10-CM | POA: Diagnosis not present

## 2018-07-16 DIAGNOSIS — R890 Abnormal level of enzymes in specimens from other organs, systems and tissues: Secondary | ICD-10-CM | POA: Diagnosis not present

## 2018-07-16 DIAGNOSIS — Z86718 Personal history of other venous thrombosis and embolism: Secondary | ICD-10-CM | POA: Diagnosis not present

## 2018-07-16 DIAGNOSIS — R531 Weakness: Secondary | ICD-10-CM

## 2018-07-16 DIAGNOSIS — S0291XA Unspecified fracture of skull, initial encounter for closed fracture: Secondary | ICD-10-CM | POA: Diagnosis present

## 2018-07-16 DIAGNOSIS — G8929 Other chronic pain: Secondary | ICD-10-CM | POA: Insufficient documentation

## 2018-07-16 DIAGNOSIS — J449 Chronic obstructive pulmonary disease, unspecified: Secondary | ICD-10-CM

## 2018-07-16 DIAGNOSIS — R7889 Finding of other specified substances, not normally found in blood: Secondary | ICD-10-CM | POA: Diagnosis not present

## 2018-07-16 DIAGNOSIS — R569 Unspecified convulsions: Secondary | ICD-10-CM | POA: Diagnosis not present

## 2018-07-16 DIAGNOSIS — R457 State of emotional shock and stress, unspecified: Secondary | ICD-10-CM | POA: Diagnosis not present

## 2018-07-16 DIAGNOSIS — S0990XA Unspecified injury of head, initial encounter: Secondary | ICD-10-CM | POA: Diagnosis not present

## 2018-07-16 DIAGNOSIS — G629 Polyneuropathy, unspecified: Secondary | ICD-10-CM | POA: Insufficient documentation

## 2018-07-16 DIAGNOSIS — K219 Gastro-esophageal reflux disease without esophagitis: Secondary | ICD-10-CM | POA: Diagnosis not present

## 2018-07-16 DIAGNOSIS — G40209 Localization-related (focal) (partial) symptomatic epilepsy and epileptic syndromes with complex partial seizures, not intractable, without status epilepticus: Secondary | ICD-10-CM | POA: Diagnosis not present

## 2018-07-16 LAB — CBC WITH DIFFERENTIAL/PLATELET
Abs Immature Granulocytes: 0 10*3/uL (ref 0.00–0.07)
Basophils Absolute: 0 10*3/uL (ref 0.0–0.1)
Basophils Relative: 0 %
Eosinophils Absolute: 0 10*3/uL (ref 0.0–0.5)
Eosinophils Relative: 1 %
HEMATOCRIT: 41 % (ref 36.0–46.0)
HEMOGLOBIN: 12.9 g/dL (ref 12.0–15.0)
Immature Granulocytes: 0 %
LYMPHS ABS: 1.4 10*3/uL (ref 0.7–4.0)
LYMPHS PCT: 39 %
MCH: 32.5 pg (ref 26.0–34.0)
MCHC: 31.5 g/dL (ref 30.0–36.0)
MCV: 103.3 fL — AB (ref 80.0–100.0)
MONO ABS: 0.2 10*3/uL (ref 0.1–1.0)
MONOS PCT: 6 %
NEUTROS ABS: 2 10*3/uL (ref 1.7–7.7)
Neutrophils Relative %: 54 %
Platelets: 140 10*3/uL — ABNORMAL LOW (ref 150–400)
RBC: 3.97 MIL/uL (ref 3.87–5.11)
RDW: 12.8 % (ref 11.5–15.5)
WBC: 3.7 10*3/uL — ABNORMAL LOW (ref 4.0–10.5)
nRBC: 0 % (ref 0.0–0.2)

## 2018-07-16 LAB — URINALYSIS, ROUTINE W REFLEX MICROSCOPIC
BILIRUBIN URINE: NEGATIVE
GLUCOSE, UA: NEGATIVE mg/dL
HGB URINE DIPSTICK: NEGATIVE
Ketones, ur: NEGATIVE mg/dL
Leukocytes, UA: NEGATIVE
Nitrite: NEGATIVE
Protein, ur: NEGATIVE mg/dL
SPECIFIC GRAVITY, URINE: 1.005 (ref 1.005–1.030)
pH: 7 (ref 5.0–8.0)

## 2018-07-16 LAB — COMPREHENSIVE METABOLIC PANEL
ALK PHOS: 52 U/L (ref 38–126)
ALT: 11 U/L (ref 0–44)
AST: 17 U/L (ref 15–41)
Albumin: 4 g/dL (ref 3.5–5.0)
Anion gap: 9 (ref 5–15)
BUN: 7 mg/dL (ref 6–20)
CALCIUM: 9.4 mg/dL (ref 8.9–10.3)
CO2: 27 mmol/L (ref 22–32)
Chloride: 107 mmol/L (ref 98–111)
Creatinine, Ser: 0.7 mg/dL (ref 0.44–1.00)
Glucose, Bld: 92 mg/dL (ref 70–99)
Potassium: 3.9 mmol/L (ref 3.5–5.1)
Sodium: 143 mmol/L (ref 135–145)
Total Bilirubin: 0.4 mg/dL (ref 0.3–1.2)
Total Protein: 6.5 g/dL (ref 6.5–8.1)

## 2018-07-16 LAB — CBC
HCT: 37 % (ref 36.0–46.0)
Hemoglobin: 11.4 g/dL — ABNORMAL LOW (ref 12.0–15.0)
MCH: 32.2 pg (ref 26.0–34.0)
MCHC: 30.8 g/dL (ref 30.0–36.0)
MCV: 104.5 fL — ABNORMAL HIGH (ref 80.0–100.0)
Platelets: 127 10*3/uL — ABNORMAL LOW (ref 150–400)
RBC: 3.54 MIL/uL — ABNORMAL LOW (ref 3.87–5.11)
RDW: 13 % (ref 11.5–15.5)
WBC: 3.3 10*3/uL — ABNORMAL LOW (ref 4.0–10.5)
nRBC: 0 % (ref 0.0–0.2)

## 2018-07-16 LAB — CREATININE, SERUM
Creatinine, Ser: 0.75 mg/dL (ref 0.44–1.00)
GFR calc Af Amer: >60 mL/min (ref >60)
GFR calc non Af Amer: >60 mL/min (ref >60)

## 2018-07-16 LAB — RAPID URINE DRUG SCREEN, HOSP PERFORMED
AMPHETAMINES: NOT DETECTED
BENZODIAZEPINES: NOT DETECTED
Barbiturates: NOT DETECTED
Cocaine: NOT DETECTED
Opiates: NOT DETECTED
Tetrahydrocannabinol: NOT DETECTED

## 2018-07-16 LAB — MAGNESIUM: MAGNESIUM: 2.1 mg/dL (ref 1.7–2.4)

## 2018-07-16 LAB — PHENYTOIN LEVEL, TOTAL: PHENYTOIN LVL: 38.2 ug/mL — AB (ref 10.0–20.0)

## 2018-07-16 MED ORDER — SODIUM CHLORIDE 0.9 % IV BOLUS
1000.0000 mL | Freq: Once | INTRAVENOUS | Status: AC
  Administered 2018-07-16: 1000 mL via INTRAVENOUS

## 2018-07-16 MED ORDER — SODIUM CHLORIDE 0.9% FLUSH
3.0000 mL | Freq: Two times a day (BID) | INTRAVENOUS | Status: DC
  Administered 2018-07-16 – 2018-07-19 (×4): 3 mL via INTRAVENOUS

## 2018-07-16 MED ORDER — GABAPENTIN 300 MG PO CAPS
300.0000 mg | ORAL_CAPSULE | Freq: Two times a day (BID) | ORAL | Status: DC
  Administered 2018-07-16 – 2018-07-19 (×6): 300 mg via ORAL
  Filled 2018-07-16 (×6): qty 1

## 2018-07-16 MED ORDER — ACETAMINOPHEN 500 MG PO TABS
500.0000 mg | ORAL_TABLET | Freq: Four times a day (QID) | ORAL | Status: DC | PRN
  Administered 2018-07-17: 500 mg via ORAL
  Filled 2018-07-16: qty 1

## 2018-07-16 MED ORDER — SENNOSIDES-DOCUSATE SODIUM 8.6-50 MG PO TABS: 1.0000 | ORAL_TABLET | Freq: Every evening | ORAL | Status: DC | PRN

## 2018-07-16 MED ORDER — ESCITALOPRAM OXALATE 10 MG PO TABS
10.0000 mg | ORAL_TABLET | Freq: Every day | ORAL | Status: DC
  Administered 2018-07-16 – 2018-07-19 (×4): 10 mg via ORAL
  Filled 2018-07-16 (×4): qty 1

## 2018-07-16 MED ORDER — LACTATED RINGERS IV SOLN
INTRAVENOUS | Status: DC
  Administered 2018-07-16: 19:00:00 via INTRAVENOUS

## 2018-07-16 MED ORDER — LACOSAMIDE 200 MG PO TABS
200.0000 mg | ORAL_TABLET | Freq: Two times a day (BID) | ORAL | Status: DC
  Administered 2018-07-16 – 2018-07-19 (×6): 200 mg via ORAL
  Filled 2018-07-16 (×6): qty 1

## 2018-07-16 MED ORDER — HEPARIN SODIUM (PORCINE) 5000 UNIT/ML IJ SOLN
5000.0000 [IU] | Freq: Three times a day (TID) | INTRAMUSCULAR | Status: DC
  Filled 2018-07-16 (×2): qty 1

## 2018-07-16 MED ORDER — ALBUTEROL SULFATE (2.5 MG/3ML) 0.083% IN NEBU: 2.5000 mg | INHALATION_SOLUTION | Freq: Four times a day (QID) | RESPIRATORY_TRACT | Status: DC | PRN

## 2018-07-16 MED ORDER — LORAZEPAM 2 MG/ML IJ SOLN: 2.0000 mg | INTRAMUSCULAR | Status: DC | PRN

## 2018-07-16 MED ORDER — CYANOCOBALAMIN 500 MCG PO TABS
250.0000 ug | ORAL_TABLET | Freq: Every day | ORAL | Status: DC
  Administered 2018-07-16 – 2018-07-18 (×3): 250 ug via ORAL
  Filled 2018-07-16 (×3): qty 1

## 2018-07-16 MED ORDER — LORAZEPAM 2 MG/ML IJ SOLN
1.0000 mg | Freq: Once | INTRAMUSCULAR | Status: AC
  Administered 2018-07-16: 1 mg via INTRAVENOUS
  Filled 2018-07-16: qty 1

## 2018-07-16 NOTE — ED Notes (Signed)
Report critical value of 38.2 Dilantin to EDP

## 2018-07-16 NOTE — Progress Notes (Signed)
[name] is a 62 y.o. female patient admitted from ED awake, alert - oriented  X 4 - no acute distress noted.  VSS - Blood pressure (!) 99/52, pulse 60, temperature 98.5 F (36.9 C), temperature source Oral, resp. rate 12, SpO2 98 %.    IV in place, occlusive dsg intact without redness.  Orientation to room, and floor completed with information packet given to patient/family. Admission INP armband ID verified with patient/family, and in place.   SR up x 2, fall assessment complete, with patient able to verbalize understanding of risk associated with falls, and verbalized understanding to call nsg before up out of bed.  Call light within reach, patient able to voice, and demonstrate understanding.  Small abrasions on elbows, foam dressing applied; otherwise skin, clean-dry- intact without evidence of bruising, or skin tears.     Will cont to eval and treat per MD orders.  Patience Musca, RN 07/16/2018 7:57 PM

## 2018-07-16 NOTE — ED Notes (Signed)
IV team at bedside 

## 2018-07-16 NOTE — ED Notes (Signed)
Pt placed on 2lpm O2

## 2018-07-16 NOTE — H&P (Signed)
TRH H&P   Patient Demographics:    [name], is a 62 y.o. female  MRN: [id number]   DOB - February 18, 1956  Admit Date - 07/16/2018  Outpatient Primary MD for the patient is [name] Gravel, MD  Outpatient Specialists: Neurologist [name]  Patient coming from: Home  Chief Complaint  Patient presents with  . Seizures      HPI:    [name]  is a 62 y.o. 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  Also she is feeling generally weak and deconditioned.  Came to the ER where she was found to have very high Dilantin level.  I was requested to admit.  Patient denies any headache, no fever chills, no cough phlegm chest pain or shortness of breath, no abdominal pain no dysuria, no diarrhea, no blood in stool or urine.  No focal weakness except chronic mild left-sided weakness.    Review of systems:    A full 10 point Review of Systems was done, except as stated above, all other Review of Systems were negative.   With Past History of the following :    Past Medical History:  Diagnosis Date  . Asthma   . Cerebral hemorrhage ([place]) 1989  . Chronic back pain   . COPD (chronic obstructive pulmonary disease) ([place])   . DDD (degenerative disc disease)  06/12/2013  . DVT (deep venous thrombosis) ([place])    "she's had several since 1990"  . GERD (gastroesophageal reflux disease)   . Headache    "usually around time when she's had a seizure"  . History of blood transfusion    "when she was a baby"  . History of stomach ulcers   . Kidney stones   . Neuropathy   . Seizures ([place])    "because of her brain surgery"  . Stroke [facility]) [phone]  family denies residual on 11/09/2014  . Tunnel vision    "since brain OR"      Past Surgical History:  Procedure Laterality Date  . [place]   craniotomy with hematoma evacuation  . CESAREAN SECTION  1979; 1987; 1989  . HEMORRHOID SURGERY    . TUBAL LIGATION  1990's      Social History:     Social History   Tobacco Use  . Smoking status: Former Smoker    Packs/day: 0.50    Years: 20.00    Pack years: 10.00    Types: Cigarettes    Last attempt to quit: 10/26/2017    Years since quitting: 0.7  . Smokeless tobacco: Never Used  . Tobacco comment: Previously smoked 2 ppd, down to 4 cigs/day  Substance Use Topics  . Alcohol use: No         Family History :     Family History  Problem Relation Age of Onset  . Heart attack Mother        4 MIs, started in her 40s, also vaginal cancer and colon cancer  . Cancer Mother   . Heart attack Father        Died suddenly age 43 - autopsy revealed heart attack her patient  . Cancer Brother        Sinus cancer       Home Medications:   Prior to Admission medications   Medication Sig Start Date End Date Taking? Authorizing Provider  acetaminophen (TYLENOL) 500 MG tablet Take 500 mg by mouth every 6 (six) hours as needed (for pain).    [provider]  albuterol (PROVENTIL HFA;VENTOLIN HFA) 108 (90 Base) MCG/ACT inhaler Inhale 2 puffs into the lungs every 6 (six) hours as needed for wheezing or shortness of breath.    [provider]  escitalopram (LEXAPRO) 10 MG tablet Take 1 tablet (10 mg total) by mouth daily.  06/21/18   [name], MD  gabapentin (NEURONTIN) 300 MG capsule Take 1 capsule (300 mg total) by mouth 2 (two) times daily. 05/26/18   Geradine Girt, DO  lacosamide (VIMPAT) 200 MG TABS tablet Take 1 tablet (200 mg total) by mouth 2 (two) times daily. 04/07/18   [name], MD  phenytoin (DILANTIN) 100 MG ER capsule Take 1 Capsule each morning and 2 capsules each night 06/24/18   [name], MD  phenytoin (DILANTIN) 30 MG ER capsule Take 2 Capsules each morning with (1) 100mg  Capsule for a total of 160mg  06/24/18   [name], MD  vitamin B-12 (CYANOCOBALAMIN) 250 MCG tablet Take 1 tablet (250 mcg total) by mouth daily. 05/27/18   Geradine Girt, DO     Allergies:     Allergies  Allergen Reactions  . Zonisamide Other (See Comments)    Numbness and tingling over whole body  . Codeine Nausea And Vomiting  . Keppra [Levetiracetam] Other (See Comments)    Causes seizures  . Pregabalin Nausea And Vomiting  . Olive Oil Rash     Physical Exam:   Vitals  Blood pressure 109/62, pulse 94, temperature 98.5 F (36.9 C), temperature source Oral, resp. rate 16, SpO2 100 %.   1. General thin middle-aged Caucasian female lying in hospital bed in no distress,  2. Normal affect and insight, Not Suicidal or Homicidal, Awake Alert, Oriented X 3.  3. No F.N deficits, ALL C.Nerves Intact, Strength 5/5 all 4 extremities, Sensation intact all 4 extremities,  Plantars down going.  Does have horizontal nystagmus on right lateral gaze,  4. Ears and Eyes appear Normal, Conjunctivae clear, PERRLA. Moist Oral Mucosa.  5. Supple Neck, No JVD, No cervical lymphadenopathy appriciated, No Carotid Bruits.  6. Symmetrical Chest wall movement, Good air movement bilaterally, CTAB.  7. RRR, No Gallops, Rubs or Murmurs, No Parasternal Heave.  8. Positive Bowel Sounds, Abdomen Soft, No tenderness, No organomegaly appriciated,No rebound -guarding or rigidity.  9.  No Cyanosis, Normal Skin  Turgor, No Skin Rash or Bruise.  10. Good muscle tone,  joints appear normal , no effusions, Normal ROM.  11. No Palpable Lymph Nodes in Neck or Axillae      Data Review:    CBC Recent Labs  Lab 07/16/18 1142  WBC 3.7*  HGB 12.9  HCT 41.0  PLT 140*  MCV 103.3*  MCH 32.5  MCHC 31.5  RDW 12.8  LYMPHSABS 1.4  MONOABS 0.2  EOSABS 0.0  BASOSABS 0.0   ------------------------------------------------------------------------------------------------------------------  Chemistries  Recent Labs  Lab 07/16/18 1142  NA 143  K 3.9  CL 107  CO2 27  GLUCOSE 92  BUN 7  CREATININE 0.70  CALCIUM 9.4  MG 2.1  AST 17  ALT 11  ALKPHOS 52  BILITOT 0.4   ------------------------------------------------------------------------------------------------------------------ CrCl cannot be calculated (Unknown ideal weight.). ------------------------------------------------------------------------------------------------------------------ No results for input(s): TSH, T4TOTAL, T3FREE, THYROIDAB in the last 72 hours.  Invalid input(s): FREET3  Coagulation profile No results for input(s): INR, PROTIME in the last 168 hours. ------------------------------------------------------------------------------------------------------------------- No results for input(s): DDIMER in the last 72 hours. -------------------------------------------------------------------------------------------------------------------  Cardiac Enzymes No results for input(s): CKMB, TROPONINI, MYOGLOBIN in the last 168 hours.  Invalid input(s): CK ------------------------------------------------------------------------------------------------------------------    Component Value Date/Time   BNP 55.6 05/23/2018 2254     ---------------------------------------------------------------------------------------------------------------  Urinalysis    Component Value Date/Time   COLORURINE YELLOW 07/16/2018 1108    APPEARANCEUR CLEAR 07/16/2018 1108   LABSPEC 1.005 07/16/2018 1108   PHURINE 7.0 07/16/2018 1108   GLUCOSEU NEGATIVE 07/16/2018 1108   HGBUR NEGATIVE 07/16/2018 1108   [place] 07/16/2018 1108   KETONESUR NEGATIVE 07/16/2018 1108   PROTEINUR NEGATIVE 07/16/2018 1108   UROBILINOGEN 0.2 01/17/2015 2350   NITRITE NEGATIVE 07/16/2018 1108   LEUKOCYTESUR NEGATIVE 07/16/2018 1108    ----------------------------------------------------------------------------------------------------------------   Imaging Results:    Ct Head Wo Contrast  Result Date: 07/16/2018 CLINICAL DATA:  Multiple seizures this week. Now with tremors and falling. EXAM: CT HEAD WITHOUT CONTRAST TECHNIQUE: Contiguous axial images were obtained from the base of the skull through the vertex without intravenous contrast. COMPARISON:  05/29/2018 FINDINGS: Brain: Examination demonstrates stable encephalomalacia over the right occipital/posterior temporal region. Remaining ventricles, cisterns and CSF spaces are otherwise unchanged and within normal. There is no mass, mass effect, shift of midline structures or acute hemorrhage. No evidence of acute infarction. Vascular: No hyperdense vessel or unexpected calcification. Skull: Previous right posterior parietal/occipital craniotomy. Sinuses/Orbits: No acute finding. Other: None. IMPRESSION: No acute findings. Previous right posterior parietal/occipital craniotomy. 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 Electronically Signed   By: Marin Olp M.D.   On: 07/16/2018 11:29       Assessment & Plan:     1.  Dilantin toxicity causing seizures, ataxia, generalized weakness.  Will be admitted, IV fluid bolus and hydration, telemetry monitoring for a day, hold Dilantin, pharmacy to monitor levels and dose adjust.  Discussed her case with neurologist Dr. Cheral Marker who has nothing else to offer and agrees with  the plan.  We will have her  closely follow with her primary neurologist [name] within a week of discharge.  For now seizure precautions, breakthrough IV Ativan if needed, continue other seizure medications.  2.  HX of cerebral hemorrhage with mild left-sided chronic weakness.  No acute issues.  PT in the morning.  3.  COPD/asthma.  Remote history of smoking.  Recommended to continue abstain from smoking she quit several months ago, COPD and asthma are stable, supportive care with oxygen and nebulizer treatments as needed.  4.  Generalized weakness and deconditioning.  Plan as a #1 above.    DVT Prophylaxis Heparin    AM Labs Ordered, also please review Full Orders  Family Communication: Admission, patients condition and plan of care including tests being ordered have been discussed with the patient  who indicates understanding and agree with the plan and Code Status.  Code Status Full  Likely DC to  Home  Condition GUARDED    Consults called: Neuro    Admission status: Obs    Time spent in minutes : 35   Lala Lund M.D on 07/16/2018 at 4:14 PM  To page go to [email or web address] - password [facility]

## 2018-07-16 NOTE — ED Notes (Signed)
933mL output - Camryn, NT.

## 2018-07-16 NOTE — ED Provider Notes (Addendum)
[facility] EMERGENCY DEPARTMENT Provider Note   CSN: [id number] Arrival date & time: 07/16/18  1020     History   Chief Complaint Chief Complaint  Patient presents with  . Seizures    HPI [name] is a 62 y.o. female.  Level 5 caveat for acuity of condition.  Patient reports "4 seizures" this morning.  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  Takes multiple medications including Dilantin, gabapentin, Vimpat.  Escitalopram was recently discontinued secondary to extremity weakness.  No prodromal illnesses.  Patient now complains of "shaking all over".     Past Medical History:  Diagnosis Date  . Asthma   . Cerebral hemorrhage ([place]) 1989  . Chronic back pain   . COPD (chronic obstructive pulmonary disease) ([place])   . DDD (degenerative disc disease) 06/12/2013  . DVT (deep venous thrombosis) ([place])    "she's had several since 1990"  . GERD (gastroesophageal reflux disease)   . Headache    "usually around time when she's had a seizure"  . History of blood transfusion    "when she was a baby"  . History of stomach ulcers   . Kidney stones   . Neuropathy   . Seizures ([place])    "because of her brain surgery"  . Stroke [facility]) [phone]   family denies residual on 11/09/2014  . Tunnel vision    "since brain OR"    Patient Active Problem List   Diagnosis Date Noted  . Altered mental status   . Weakness of both lower extremities   . Generalized weakness 05/25/2018  . History of CVA (cerebrovascular accident)   . Pain   . Chronic back pain   . Neuropathy   . Orthostasis   . Tobacco abuse   . Left-sided weakness 03/19/2018  . Atypical chest pain 02/17/2017  . Localized swelling of lower extremity 02/17/2017  . GERD (gastroesophageal reflux disease) 02/17/2017  . Costochondritis 02/17/2017  . Seizures ([place]) 11/02/2015  . Gait disturbance 11/02/2015  . Right clavicle fracture 11/02/2015  . Infection of urinary  tract 11/02/2015  . Chest pain 03/31/2015  . Acute on chronic respiratory failure with hypoxia ([place]) 03/31/2015  . SOB (shortness of breath) 03/31/2015  . Anxiety state 03/31/2015  . Malnutrition of moderate degree ([place]) 03/31/2015  . COPD with exacerbation ([place]) 03/30/2015  . Hypoxia 01/18/2015  . Chronic obstructive pulmonary disease with acute exacerbation ([place])   . Fever 01/17/2015  . Cough 01/17/2015  . Sepsis secondary to UTI ([place]) 01/17/2015  . COPD (chronic obstructive pulmonary disease) ([place])   . Unintentional weight loss 01/11/2015  . Asthma 05/15/2014  . Seizure ([place]) 05/15/2014  . Protein calorie malnutrition ([place]) 05/15/2014  . DVT of leg (deep venous thrombosis) ([place]) 06/19/2013  . DDD (degenerative disc disease) 06/12/2013  . Paresthesias 06/12/2013  . Tremor 06/09/2013  . Thrombocytopenia- chronic 06/29/2012  . Noncompliance with medication treatment due to underuse of medication 06/29/2012  . Stress-personal 06/29/2012  . Recurrent seizures ([place]) 06/29/2012  . Tobacco dependence 06/28/2012  . Skull fracture ([place]) 02/28/2012  . Dilantin toxicity 02/28/2012  . Seizure disorder ([place]) 02/28/2012  . History of Intracranial bleed 02/28/2012    Past Surgical History:  Procedure Laterality Date  . [place]   craniotomy with hematoma evacuation  . CESAREAN SECTION  1979; 1987; 1989  . HEMORRHOID SURGERY    . TUBAL LIGATION  1990's     OB History  No obstetric history on file.      Home Medications    Prior to Admission medications   Medication Sig Start Date End Date Taking? Authorizing Provider  acetaminophen (TYLENOL) 500 MG tablet Take 500 mg by mouth every 6 (six) hours as needed (for pain).    [provider]  albuterol (PROVENTIL HFA;VENTOLIN HFA) 108 (90 Base) MCG/ACT inhaler Inhale 2 puffs into the lungs every 6 (six) hours as needed for wheezing or shortness of breath.    [provider]  escitalopram (LEXAPRO) 10 MG  tablet Take 1 tablet (10 mg total) by mouth daily. 06/21/18   [name], MD  gabapentin (NEURONTIN) 300 MG capsule Take 1 capsule (300 mg total) by mouth 2 (two) times daily. 05/26/18   Geradine Girt, DO  lacosamide (VIMPAT) 200 MG TABS tablet Take 1 tablet (200 mg total) by mouth 2 (two) times daily. 04/07/18   [name], MD  phenytoin (DILANTIN) 100 MG ER capsule Take 1 Capsule each morning and 2 capsules each night 06/24/18   [name], MD  phenytoin (DILANTIN) 30 MG ER capsule Take 2 Capsules each morning with (1) 100mg  Capsule for a total of 160mg  06/24/18   [name], MD  vitamin B-12 (CYANOCOBALAMIN) 250 MCG tablet Take 1 tablet (250 mcg total) by mouth daily. 05/27/18   Geradine Girt, DO    Family History Family History  Problem Relation Age of Onset  . Heart attack Mother        4 MIs, started in her 40s, also vaginal cancer and colon cancer  . Cancer Mother   . Heart attack Father        Died suddenly age 87 - autopsy revealed heart attack her patient  . Cancer Brother        Sinus cancer    Social History Social History   Tobacco Use  . Smoking status: Former Smoker    Packs/day: 0.50    Years: 20.00    Pack years: 10.00    Types: Cigarettes    Last attempt to quit: 10/26/2017    Years since quitting: 0.7  . Smokeless tobacco: Never Used  . Tobacco comment: Previously smoked 2 ppd, down to 4 cigs/day  Substance Use Topics  . Alcohol use: No  . Drug use: No     Allergies   Zonisamide; Codeine; Keppra [levetiracetam]; Pregabalin; and Olive oil   Review of Systems Review of Systems  Unable to perform ROS: Acuity of condition     Physical Exam Updated Vital Signs BP 109/62   Pulse 94   Temp 98.5 F (36.9 C) (Oral)   Resp 16   SpO2 100%   Physical Exam Vitals signs and nursing note reviewed.  Constitutional:      Appearance: She is well-developed.     Comments: Spasmodic shaking, but no true seizure  HENT:     Head:  Normocephalic and atraumatic.  Eyes:     Conjunctiva/sclera: Conjunctivae normal.  Neck:     Musculoskeletal: Neck supple.  Cardiovascular:     Rate and Rhythm: Normal rate and regular rhythm.  Pulmonary:     Effort: Pulmonary effort is normal.     Breath sounds: Normal breath sounds.  Abdominal:     General: Bowel sounds are normal.     Palpations: Abdomen is soft.  Musculoskeletal: Normal range of motion.  Skin:    General: Skin is warm and dry.  Neurological:  Mental Status: She is alert and oriented to person, place, and time.     Comments: Moving all 4 extremities.  Psychiatric:        Behavior: Behavior normal.      ED Treatments / Results  Labs (all labs ordered are listed, but only abnormal results are displayed) Labs Reviewed  PHENYTOIN LEVEL, TOTAL - Abnormal; Notable for the following components:      Result Value   Phenytoin Lvl 38.2 (*)    All other components within normal limits  CBC WITH DIFFERENTIAL/PLATELET - Abnormal; Notable for the following components:   WBC 3.7 (*)    MCV 103.3 (*)    Platelets 140 (*)    All other components within normal limits  RAPID URINE DRUG SCREEN, HOSP PERFORMED  URINALYSIS, ROUTINE W REFLEX MICROSCOPIC  COMPREHENSIVE METABOLIC PANEL  MAGNESIUM    EKG None  Radiology Ct Head Wo Contrast  Result Date: 07/16/2018 CLINICAL DATA:  Multiple seizures this week. Now with tremors and falling. EXAM: CT HEAD WITHOUT CONTRAST TECHNIQUE: Contiguous axial images were obtained from the base of the skull through the vertex without intravenous contrast. COMPARISON:  05/29/2018 FINDINGS: Brain: Examination demonstrates stable encephalomalacia over the right occipital/posterior temporal region. Remaining ventricles, cisterns and CSF spaces are otherwise unchanged and within normal. There is no mass, mass effect, shift of midline structures or acute hemorrhage. No evidence of acute infarction. Vascular: No hyperdense vessel or  unexpected calcification. Skull: Previous right posterior parietal/occipital craniotomy. Sinuses/Orbits: No acute finding. Other: None. IMPRESSION: No acute findings. Previous right posterior parietal/occipital craniotomy. 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 Electronically Signed   By: Marin Olp M.D.   On: 07/16/2018 11:29    Procedures Procedures (including critical care time)  Medications Ordered in ED Medications  sodium chloride 0.9 % bolus 1,000 mL (0 mLs Intravenous Stopped 07/16/18 1311)  LORazepam (ATIVAN) injection 1 mg (1 mg Intravenous Given 07/16/18 1211)     Initial Impression / Assessment and Plan / ED Course  I have reviewed the triage vital signs and the nursing notes.  Pertinent labs & imaging results that were available during my care of the patient were reviewed by me and considered in my medical decision making (see chart for details).     Uncertain etiology of patient's recent seizures.  No true tonic-clonic seizure activity noted in the emergency department.  CT scan showed no acute findings.  Dilantin level elevated.  Drug screen negative.  She feels better after IV fluids and IV Ativan.  Will discuss with neuro hospitalist and admit to gen med.   CRITICAL CARE Performed by: Nat Christen Total critical care time: 30 minutes Critical care time was exclusive of separately billable procedures and treating other patients. Critical care was necessary to treat or prevent imminent or life-threatening deterioration. Critical care was time spent personally by me on the following activities: development of treatment plan with patient and/or surrogate as well as nursing, discussions with consultants, evaluation of patient's response to treatment, examination of patient, obtaining history from patient or surrogate, ordering and performing treatments and interventions, ordering and review of laboratory studies, ordering and review  of radiographic studies, pulse oximetry and re-evaluation of patient's condition.  Final Clinical Impressions(s) / ED Diagnoses   Final diagnoses:  Seizure ([place])  Elevated Dilantin level    ED Discharge Orders    None       Nat Christen, MD 07/16/18 1513    Nat Christen, MD 07/16/18 [phone]

## 2018-07-16 NOTE — ED Triage Notes (Signed)
Pt arrives via EMS from home with multiple seizures this week. Didn't want to come to ED sooner because her daughter in leaving town. Pt reports 4 gran mal seizures this week. Now complains of shaking allover and falling at home. Pt awake, alert, appropriate, oriented x4.

## 2018-07-16 NOTE — Progress Notes (Signed)
MEDICATION RELATED CONSULT NOTE - FOLLOW UP   Pharmacy Consult for Phenytoin Indication: Hx of seizures  Allergies  Allergen Reactions  . Zonisamide Other (See Comments)    Numbness and tingling over whole body  . Codeine Nausea And Vomiting  . Keppra [Levetiracetam] Other (See Comments)    Causes seizures  . Pregabalin Nausea And Vomiting  . Olive Oil Rash    Vital Signs: Temp: 98.5 F (36.9 C) (12/13 1041) Temp Source: Oral (12/13 1041) BP: 109/62 (12/13 1400) Pulse Rate: 94 (12/13 1400) Intake/Output from previous day: No intake/output data recorded. Intake/Output from this shift: Total I/O In: 1000 [IV Piggyback:1000] Out: -   Labs: Recent Labs    07/16/18 1142  WBC 3.7*  HGB 12.9  HCT 41.0  PLT 140*  CREATININE 0.70  MG 2.1  ALBUMIN 4.0  PROT 6.5  AST 17  ALT 11  ALKPHOS 52  BILITOT 0.4   CrCl cannot be calculated (Unknown ideal weight.).   Microbiology: No results found for this or any previous visit (from the past 720 hour(s)).  Assessment: 61 yo F presents with multiple seizures this week. Phenytoin level came back elevated at 38.2. SCr and albumin ok.  Goal of Therapy:  Prevention of seizures Therapeutic levels of 10-65mcg/mL  Plan:  Hold phenytoin Recheck phenytoin level with AM labs on 12/15  Raevyn Sokol J 07/16/2018,4:19 PM

## 2018-07-17 DIAGNOSIS — R531 Weakness: Secondary | ICD-10-CM | POA: Diagnosis not present

## 2018-07-17 DIAGNOSIS — T420X1A Poisoning by hydantoin derivatives, accidental (unintentional), initial encounter: Secondary | ICD-10-CM

## 2018-07-17 DIAGNOSIS — R569 Unspecified convulsions: Secondary | ICD-10-CM | POA: Diagnosis not present

## 2018-07-17 DIAGNOSIS — J449 Chronic obstructive pulmonary disease, unspecified: Secondary | ICD-10-CM | POA: Diagnosis not present

## 2018-07-17 DIAGNOSIS — R7889 Finding of other specified substances, not normally found in blood: Secondary | ICD-10-CM | POA: Diagnosis not present

## 2018-07-17 LAB — CBC
HCT: 36.6 % (ref 36.0–46.0)
Hemoglobin: 11.5 g/dL — ABNORMAL LOW (ref 12.0–15.0)
MCH: 32.7 pg (ref 26.0–34.0)
MCHC: 31.4 g/dL (ref 30.0–36.0)
MCV: 104 fL — ABNORMAL HIGH (ref 80.0–100.0)
Platelets: 127 10*3/uL — ABNORMAL LOW (ref 150–400)
RBC: 3.52 MIL/uL — ABNORMAL LOW (ref 3.87–5.11)
RDW: 13 % (ref 11.5–15.5)
WBC: 3.8 10*3/uL — ABNORMAL LOW (ref 4.0–10.5)
nRBC: 0 % (ref 0.0–0.2)

## 2018-07-17 LAB — COMPREHENSIVE METABOLIC PANEL
ALT: 10 U/L (ref 0–44)
AST: 12 U/L — ABNORMAL LOW (ref 15–41)
Albumin: 2.8 g/dL — ABNORMAL LOW (ref 3.5–5.0)
Alkaline Phosphatase: 49 U/L (ref 38–126)
Anion gap: 3 — ABNORMAL LOW (ref 5–15)
BUN: 10 mg/dL (ref 6–20)
CO2: 30 mmol/L (ref 22–32)
Calcium: 8.3 mg/dL — ABNORMAL LOW (ref 8.9–10.3)
Chloride: 110 mmol/L (ref 98–111)
Creatinine, Ser: 0.76 mg/dL (ref 0.44–1.00)
GFR calc Af Amer: >60 mL/min (ref >60)
GFR calc non Af Amer: >60 mL/min (ref >60)
Glucose, Bld: 91 mg/dL (ref 70–99)
Potassium: 3.8 mmol/L (ref 3.5–5.1)
Sodium: 143 mmol/L (ref 135–145)
TOTAL PROTEIN: 5 g/dL — AB (ref 6.5–8.1)
Total Bilirubin: 0.3 mg/dL (ref 0.3–1.2)

## 2018-07-17 LAB — PROTIME-INR
INR: 1.09
PROTHROMBIN TIME: 14 s (ref 11.4–15.2)

## 2018-07-17 LAB — PHENYTOIN LEVEL, TOTAL: Phenytoin Lvl: 26.6 ug/mL — ABNORMAL HIGH (ref 10.0–20.0)

## 2018-07-17 MED ORDER — LACTATED RINGERS IV SOLN
INTRAVENOUS | Status: DC
  Administered 2018-07-17 (×2): via INTRAVENOUS

## 2018-07-17 NOTE — Progress Notes (Signed)
PROGRESS NOTE                                                                                                                                                                                                             Patient Demographics:    [name], is a 62 y.o. female, DOB - 1955/12/17, QQP:[id number]  Admit date - 07/16/2018   Admitting Physician Thurnell Lose, MD  Outpatient Primary MD for the patient is [name], MD  LOS - 0  Chief Complaint  Patient presents with  . Seizures       Brief Narrative    [name]  is a 62 y.o. 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  Also she is feeling generally weak and deconditioned.  Came to the ER where she was found to have very high Dilantin level.  I was requested to admit.  Patient denies any headache, no fever chills, no cough phlegm chest pain or shortness of breath, no abdominal pain no dysuria, no diarrhea, no blood in stool or urine.  No focal weakness except chronic mild left-sided weakness.   Subjective:    [name] today has, No headache, No chest pain, No abdominal pain - No Nausea, No new weakness tingling or numbness, No Cough - SOB.  Still feels weak all over.   Assessment  & Plan :   1.  Dilantin toxicity causing breakthroughseizures, ataxia, generalized weakness.    CT head unremarkable, given IV fluids bolus with maintenance and will be continued for another 24 hours, Dilantin level gradually coming down but still quite  high, continues to have generalized weakness and unable to stand up without assistance.  No focal deficits.  She has mild left-sided weakness which is chronic from a previous stroke.  Repeat Dilantin level tomorrow, PT, if better discharge in the next 1 to 2 days.  Cannot be discharged as she is unable to stand up.  2.  HX of cerebral hemorrhage with mild left-sided chronic weakness.  No acute issues.  PT in the morning.  3.  COPD/asthma.  Remote history of smoking.  Recommended to continue abstain from smoking she quit several months ago, COPD and asthma are stable, supportive care with oxygen and nebulizer treatments as needed.  4.  Generalized weakness and deconditioning.  Plan as a #1 above.  5.  History of seizures with a few breakthrough seizures prior to admission due to Dilantin toxicity.  Dilantin been held, other seizure medications in place, PRN Ativan, currently seizure-free.    Family Communication  :  None  Code Status :  Full  Disposition Plan  :  Home once better  Consults  :  Neuro over the phone  Procedures  :    CT head - non acute  DVT Prophylaxis  :   Heparin    Lab Results  Component Value Date   PLT 127 (L) 07/17/2018    Diet :  Diet Order            DIET SOFT Room service appropriate? Yes; Fluid consistency: Thin  Diet effective now               Inpatient Medications Scheduled Meds: . escitalopram  10 mg Oral Daily  . gabapentin  300 mg Oral BID  . heparin  5,000 Units Subcutaneous Q8H  . lacosamide  200 mg Oral BID  . sodium chloride flush  3 mL Intravenous Q12H  . vitamin B-12  250 mcg Oral Daily   Continuous Infusions: . lactated ringers     PRN Meds:.acetaminophen, albuterol, LORazepam, senna-docusate  Antibiotics  :   Anti-infectives (From admission, onward)   None          Objective:   Vitals:   07/16/18 1730 07/16/18 1745 07/16/18 2137 07/17/18 0419  BP: (!) 109/56 (!) 99/52 (!) 100/50 (!) 103/52  Pulse: (!) 58 60  70 (!) 58  Resp: 15 12 18 18   Temp:   97.8 F (36.6 C) 97.8 F (36.6 C)  TempSrc:   Oral   SpO2: 100% 98% 97% 97%    Wt Readings from Last 3 Encounters:  06/07/18 56.2 kg  05/29/18 83.9 kg  05/26/18 58.3 kg     Intake/Output Summary (Last 24 hours) at 07/17/2018 1356 Last data filed at 07/17/2018 1155 Gross per 24 hour  Intake 1188.15 ml  Output 700 ml  Net 488.15 ml     Physical Exam  Awake Alert, Oriented X 3, No new F.N deficits, Normal affect [place].AT,PERRAL Supple Neck,No JVD, No cervical lymphadenopathy appriciated.  Symmetrical Chest wall movement, Good air movement bilaterally, CTAB RRR,No Gallops,Rubs or new Murmurs, No Parasternal Heave +ve B.Sounds, Abd Soft, No tenderness, No organomegaly appriciated, No rebound - guarding or rigidity. No Cyanosis, Clubbing or edema, No new Rash or bruise       Data Review:    CBC Recent Labs  Lab 07/16/18 1142 07/16/18 1956 07/17/18 0222  WBC 3.7* 3.3* 3.8*  HGB 12.9 11.4* 11.5*  HCT 41.0 37.0 36.6  PLT 140* 127* 127*  MCV 103.3* 104.5* 104.0*  MCH 32.5 32.2 32.7  MCHC 31.5 30.8 31.4  RDW 12.8 13.0 13.0  LYMPHSABS 1.4  --   --   MONOABS 0.2  --   --   EOSABS 0.0  --   --   BASOSABS 0.0  --   --     Chemistries  Recent Labs  Lab 07/16/18 1142 07/16/18 1956 07/17/18 0222  NA 143  --  143  K 3.9  --  3.8  CL 107  --  110  CO2 27  --  30  GLUCOSE 92  --  91  BUN 7  --  10  CREATININE 0.70 0.75 0.76  CALCIUM 9.4  --  8.3*  MG 2.1  --   --   AST 17  --  12*  ALT 11  --  10  ALKPHOS 52  --  49  BILITOT 0.4  --  0.3   ------------------------------------------------------------------------------------------------------------------ No results for input(s): CHOL, HDL, LDLCALC, TRIG, CHOLHDL, LDLDIRECT in the last 72 hours.  Lab Results  Component Value Date   HGBA1C 5.2 05/24/2018    ------------------------------------------------------------------------------------------------------------------ No results for input(s): TSH, T4TOTAL, T3FREE, THYROIDAB in the last 72 hours.  Invalid input(s): FREET3 ------------------------------------------------------------------------------------------------------------------ No results for input(s): VITAMINB12, FOLATE, FERRITIN, TIBC, IRON, RETICCTPCT in the last 72 hours.  Coagulation profile Recent Labs  Lab 07/17/18 0222  INR 1.09    No results for input(s): DDIMER in the last 72 hours.  Cardiac Enzymes No results for input(s): CKMB, TROPONINI, MYOGLOBIN in the last 168 hours.  Invalid input(s): CK ------------------------------------------------------------------------------------------------------------------    Component Value Date/Time   BNP 55.6 05/23/2018 2254    Micro Results No results found for this or any previous visit (from the past 240 hour(s)).  Radiology Reports Ct Head Wo Contrast  Result Date: 07/16/2018 CLINICAL DATA:  Multiple seizures this week. Now with tremors and falling. EXAM: CT HEAD WITHOUT CONTRAST TECHNIQUE: Contiguous axial images were obtained from the base of the skull through the vertex without intravenous contrast. COMPARISON:  05/29/2018 FINDINGS: Brain: Examination demonstrates stable encephalomalacia over the right occipital/posterior temporal region. Remaining ventricles, cisterns and CSF spaces are otherwise unchanged and within normal. There is no mass, mass effect, shift of midline structures or acute hemorrhage. No evidence of acute infarction. Vascular: No hyperdense vessel or unexpected calcification. Skull: Previous right posterior parietal/occipital craniotomy. Sinuses/Orbits: No acute finding. Other: None. IMPRESSION: No acute findings. Previous right posterior parietal/occipital craniotomy. 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  Electronically Signed   By: Marin Olp M.D.   On: 07/16/2018 11:29    Time Spent in minutes  30   Lala Lund M.D on 07/17/2018 at 1:56 PM  To page go to [email or web address] - password [facility]

## 2018-07-17 NOTE — Progress Notes (Signed)
Attempted to get patient oob with two staff assist and gait belt. Patient only able to stand for a few seconds before her knees give out and she falls back into the bed. Attempted numerous times but patient unsafe to be ambulated at this time.

## 2018-07-17 NOTE — Progress Notes (Signed)
Rehab Admissions Coordinator Note:  Patient was screened by Cleatrice Burke for appropriateness for an Inpatient Acute Rehab Consult per PT recs.   At this time, we are recommending Inpatient Rehab consult. Please place order for consult if you would like pt considered for an inpt rehab admit. Please advise Dr. Candiss Norse.  Cleatrice Burke 07/17/2018, 6:53 PM  I can be reached at [phone].

## 2018-07-17 NOTE — Evaluation (Signed)
Physical Therapy Evaluation Patient Details Name: [name] MRN: [id number] DOB: 1956/03/17 Today's Date: 07/17/2018   History of Present Illness  Pt is a 62 y.o. 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  She was admitted to the hospital a few months ago with breakthrough seizures when she was taken off of Dilantin. She presented to the ER 07/16/18 with 3 to 4-day history of tonic-clonic activity with falls suspicious for seizures at home.     Clinical Impression  Pt admitted with above diagnosis. Pt currently with functional limitations due to the deficits listed below (see PT Problem List). PTA pt active and independent. On eval, she required max assist bed mobility and mod assist sit to stand with RW. Pt tolerated static stand with RW ~ 20 sec mod assist. Mobility significantly limited by pain. Pt anxious and tearful throughout session. Primary c/o is low back pain, radiating horizontally across low back and up right paraspinals to neck. R paraspinal muscles tender to palpation. Pt exhibits BLE weakness and numbness. However, pt also with h/o peripheral neuropathy so unsure of acute vs chronic. Pt will benefit from skilled PT to increase their independence and safety with mobility to allow discharge to the venue listed below.       Follow Up Recommendations CIR    Equipment Recommendations  Rolling walker with 5" wheels    Recommendations for Other Services       Precautions / Restrictions Precautions Precautions: Fall      Mobility  Bed Mobility Overal bed mobility: Needs Assistance Bed Mobility: Supine to Sit;Sit to Supine     Supine to sit: Max assist Sit to supine: Max assist   General bed mobility comments: cues for sequencing, assist with BLE off bed and to elevate trunk, use of  bed pad to scoot to EOB  Transfers Overall transfer level: Needs assistance Equipment used: Rolling walker (2 wheeled) Transfers: Sit to/from Stand Sit to Stand: Mod assist         General transfer comment: mod assist to power up, tolerated static stand ~ 20 sec. Return to EOB required due to pain.  Ambulation/Gait             General Gait Details: unable due to pain  Stairs            Wheelchair Mobility    Modified Rankin (Stroke Patients Only)       Balance Overall balance assessment: Needs assistance Sitting-balance support: Bilateral upper extremity supported;Feet supported Sitting balance-Leahy Scale: Fair Sitting balance - Comments: pt unable to sit without BUE support due to back pain   Standing balance support: Bilateral upper extremity supported Standing balance-Leahy Scale: Poor Standing balance comment: heavy reliance on UE support                             Pertinent Vitals/Pain Pain Assessment: 0-10 Pain Score: 9  Pain Location: low back Pain Descriptors / Indicators: Sharp;Shooting;Grimacing;Guarding;Tender Pain Intervention(s): Limited activity within patient's tolerance;Repositioned;Monitored during session    [place] expects to be discharged to:: Private residence Living Arrangements: Children;Other relatives Available Help at Discharge: Family;Available PRN/intermittently Type of Home: House Home Access: Level entry     Home Layout: Two level Home Equipment: None      Prior Function  Level of Independence: Independent               Hand Dominance   Dominant Hand: Right    Extremity/Trunk Assessment   Upper Extremity Assessment Upper Extremity Assessment: Overall WFL for tasks assessed    Lower Extremity Assessment Lower Extremity Assessment: RLE deficits/detail;LLE deficits/detail RLE Deficits / Details: pain with full PROM, pt reports numbness RLE: Unable to fully assess due to  pain RLE Sensation: history of peripheral neuropathy LLE Deficits / Details: pain with full PROM, pt reports numbness (L>R) LLE: Unable to fully assess due to pain LLE Sensation: history of peripheral neuropathy       Communication   Communication: No difficulties  Cognition Arousal/Alertness: Awake/alert Behavior During Therapy: Anxious Overall Cognitive Status: History of cognitive impairments - at baseline                                        General Comments General comments (skin integrity, edema, etc.): son present during session    Exercises     Assessment/Plan    PT Assessment Patient needs continued PT services  PT Problem List Decreased strength;Decreased balance;Pain;Decreased mobility;Decreased safety awareness;Decreased activity tolerance       PT Treatment Interventions DME instruction;Gait training;Stair training;Functional mobility training;Therapeutic activities;Balance training;Therapeutic exercise;Patient/family education;Neuromuscular re-education    PT Goals (Current goals can be found in the Care Plan section)  Acute Rehab PT Goals Patient Stated Goal: figure out why she is hurting PT Goal Formulation: With patient Time For Goal Achievement: 07/31/18 Potential to Achieve Goals: Good    Frequency Min 3X/week   Barriers to discharge        Co-evaluation               AM-PAC PT "6 Clicks" Mobility  Outcome Measure Help needed turning from your back to your side while in a flat bed without using bedrails?: A Lot Help needed moving from lying on your back to sitting on the side of a flat bed without using bedrails?: A Lot Help needed moving to and from a bed to a chair (including a wheelchair)?: A Lot Help needed standing up from a chair using your arms (e.g., wheelchair or bedside chair)?: A Lot Help needed to walk in hospital room?: Total Help needed climbing 3-5 steps with a railing? : Total 6 Click Score: 10    End  of Session Equipment Utilized During Treatment: Gait belt Activity Tolerance: Patient limited by pain Patient left: in bed;with call bell/phone within reach;with family/visitor present;with bed alarm set Nurse Communication: Mobility status PT Visit Diagnosis: Other abnormalities of gait and mobility (R26.89);Unsteadiness on feet (R26.81);Muscle weakness (generalized) (M62.81);Pain    Time: 5638-7564 PT Time Calculation (min) (ACUTE ONLY): 27 min   Charges:   PT Evaluation $PT Eval Moderate Complexity: 1 Mod PT Treatments $Therapeutic Activity: 8-22 mins        Lorrin Goodell, PT  Office # [phone] Pager [phone]   Lorriane Shire 07/17/2018, 2:20 PM

## 2018-07-18 ENCOUNTER — Observation Stay (HOSPITAL_COMMUNITY): Payer: Medicare HMO

## 2018-07-18 DIAGNOSIS — R270 Ataxia, unspecified: Secondary | ICD-10-CM | POA: Diagnosis not present

## 2018-07-18 DIAGNOSIS — G8220 Paraplegia, unspecified: Secondary | ICD-10-CM | POA: Diagnosis not present

## 2018-07-18 DIAGNOSIS — J449 Chronic obstructive pulmonary disease, unspecified: Secondary | ICD-10-CM | POA: Diagnosis not present

## 2018-07-18 DIAGNOSIS — T420X1A Poisoning by hydantoin derivatives, accidental (unintentional), initial encounter: Secondary | ICD-10-CM | POA: Diagnosis not present

## 2018-07-18 DIAGNOSIS — R569 Unspecified convulsions: Secondary | ICD-10-CM | POA: Diagnosis not present

## 2018-07-18 DIAGNOSIS — M5124 Other intervertebral disc displacement, thoracic region: Secondary | ICD-10-CM | POA: Diagnosis not present

## 2018-07-18 DIAGNOSIS — R531 Weakness: Secondary | ICD-10-CM | POA: Diagnosis not present

## 2018-07-18 DIAGNOSIS — I69354 Hemiplegia and hemiparesis following cerebral infarction affecting left non-dominant side: Secondary | ICD-10-CM | POA: Diagnosis not present

## 2018-07-18 DIAGNOSIS — Z87891 Personal history of nicotine dependence: Secondary | ICD-10-CM | POA: Diagnosis not present

## 2018-07-18 DIAGNOSIS — M4056 Lordosis, unspecified, lumbar region: Secondary | ICD-10-CM | POA: Diagnosis not present

## 2018-07-18 DIAGNOSIS — T420X5A Adverse effect of hydantoin derivatives, initial encounter: Secondary | ICD-10-CM | POA: Diagnosis not present

## 2018-07-18 DIAGNOSIS — G40909 Epilepsy, unspecified, not intractable, without status epilepticus: Principal | ICD-10-CM

## 2018-07-18 DIAGNOSIS — M4802 Spinal stenosis, cervical region: Secondary | ICD-10-CM | POA: Diagnosis not present

## 2018-07-18 DIAGNOSIS — E538 Deficiency of other specified B group vitamins: Secondary | ICD-10-CM | POA: Diagnosis not present

## 2018-07-18 DIAGNOSIS — R7889 Finding of other specified substances, not normally found in blood: Secondary | ICD-10-CM | POA: Diagnosis not present

## 2018-07-18 DIAGNOSIS — M48061 Spinal stenosis, lumbar region without neurogenic claudication: Secondary | ICD-10-CM | POA: Diagnosis not present

## 2018-07-18 LAB — PHENYTOIN LEVEL, TOTAL: PHENYTOIN LVL: 20.7 ug/mL — AB (ref 10.0–20.0)

## 2018-07-18 LAB — BASIC METABOLIC PANEL
Anion gap: 10 (ref 5–15)
BUN: 9 mg/dL (ref 6–20)
CO2: 25 mmol/L (ref 22–32)
Calcium: 8.8 mg/dL — ABNORMAL LOW (ref 8.9–10.3)
Chloride: 108 mmol/L (ref 98–111)
Creatinine, Ser: 0.57 mg/dL (ref 0.44–1.00)
GFR calc Af Amer: >60 mL/min (ref >60)
GFR calc non Af Amer: >60 mL/min (ref >60)
Glucose, Bld: 92 mg/dL (ref 70–99)
Potassium: 3.8 mmol/L (ref 3.5–5.1)
Sodium: 143 mmol/L (ref 135–145)

## 2018-07-18 LAB — CBC
HCT: 39.4 % (ref 36.0–46.0)
Hemoglobin: 12.3 g/dL (ref 12.0–15.0)
MCH: 31.9 pg (ref 26.0–34.0)
MCHC: 31.2 g/dL (ref 30.0–36.0)
MCV: 102.3 fL — ABNORMAL HIGH (ref 80.0–100.0)
Platelets: 143 10*3/uL — ABNORMAL LOW (ref 150–400)
RBC: 3.85 MIL/uL — ABNORMAL LOW (ref 3.87–5.11)
RDW: 12.9 % (ref 11.5–15.5)
WBC: 3.3 10*3/uL — ABNORMAL LOW (ref 4.0–10.5)
nRBC: 0 % (ref 0.0–0.2)

## 2018-07-18 LAB — TSH: TSH: 1.315 u[IU]/mL (ref 0.350–4.500)

## 2018-07-18 LAB — VITAMIN B12: Vitamin B-12: 269 pg/mL (ref 180–914)

## 2018-07-18 LAB — CK: CK TOTAL: 38 U/L (ref 38–234)

## 2018-07-18 MED ORDER — FOLIC ACID 1 MG PO TABS
1.0000 mg | ORAL_TABLET | Freq: Every day | ORAL | Status: DC
  Administered 2018-07-18 – 2018-07-19 (×2): 1 mg via ORAL
  Filled 2018-07-18 (×2): qty 1

## 2018-07-18 MED ORDER — LORAZEPAM 2 MG/ML IJ SOLN
1.0000 mg | Freq: Once | INTRAMUSCULAR | Status: AC | PRN
  Administered 2018-07-18: 1 mg via INTRAVENOUS
  Filled 2018-07-18: qty 1

## 2018-07-18 MED ORDER — MORPHINE SULFATE (PF) 2 MG/ML IV SOLN
2.0000 mg | Freq: Once | INTRAVENOUS | Status: AC
  Administered 2018-07-18: 2 mg via INTRAVENOUS
  Filled 2018-07-18: qty 1

## 2018-07-18 MED ORDER — CYANOCOBALAMIN 1000 MCG/ML IJ SOLN
1000.0000 ug | Freq: Once | INTRAMUSCULAR | Status: AC
  Administered 2018-07-18: 1000 ug via INTRAMUSCULAR
  Filled 2018-07-18: qty 1

## 2018-07-18 MED ORDER — NITROGLYCERIN 0.4 MG SL SUBL: 0.4000 mg | SUBLINGUAL_TABLET | SUBLINGUAL | Status: DC | PRN

## 2018-07-18 MED ORDER — CYANOCOBALAMIN 1000 MCG/ML IJ SOLN
1000.0000 ug | Freq: Every day | INTRAMUSCULAR | Status: DC
  Administered 2018-07-19: 1000 ug via INTRAMUSCULAR
  Filled 2018-07-18: qty 1

## 2018-07-18 MED ORDER — ASPIRIN 81 MG PO CHEW
81.0000 mg | CHEWABLE_TABLET | Freq: Every day | ORAL | Status: DC
  Administered 2018-07-18 – 2018-07-19 (×2): 81 mg via ORAL
  Filled 2018-07-18 (×2): qty 1

## 2018-07-18 NOTE — Progress Notes (Addendum)
PROGRESS NOTE                                                                                                                                                                                                             Patient Demographics:    [name], is a 62 y.o. female, DOB - 1956-07-03, NLZ:[id number]  Admit date - 07/16/2018   Admitting Physician Thurnell Lose, MD  Outpatient Primary MD for the patient is [name], MD  LOS - 0  Chief Complaint  Patient presents with  . Seizures       Brief Narrative    [name]  is a 63 y.o. 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  Also she is feeling generally weak and deconditioned.  Came to the ER where she was found to have very high Dilantin level.  I was requested to admit.  Patient denies any headache, no fever chills, no cough phlegm chest pain or shortness of breath, no abdominal pain no dysuria, no diarrhea, no blood in stool or urine.  No focal weakness except chronic mild left-sided weakness.   Subjective:   Patient in bed, appears comfortable, denies any headache, no fever, no chest pain or pressure, no shortness of breath , no abdominal pain. No focal weakness continues to have bilateral lower extremity weakness.    Assessment  & Plan :   1.  Dilantin toxicity causing breakthroughseizures, ataxia, generalized weakness.    She was treated with supportive care, offending medication Dilantin was held, gradually improving and now at top  normal levels.  No further seizures.  Patient surprisingly continues to have weakness mostly in bilateral lower extremities, this has not improved much, continues to have poor balance.  She had multiple falls darting few days prior to admission with lower extremity weakness.  On exam her left-sided slightly more weak than right but states that is chronic.  She says her legs feel heavy constantly and she has some dull ache.  She also  states that her whole spine hurts but points more towards the right flank area and then says pain goes from her right flank to her neck.  Symptoms and exam are rather unusual.  At this time I discussed the case with neurologist Dr. Cheral Marker again, he will see the patient, I will also go ahead and MRI her entire spine.  I doubt she had a spinal infarct however will be prudent to rule it out.  81 of aspirin.  Supportive care with PT OT.  Will also check TSH and B12 levels along with PT and OT. ? B12 deficiency.   2.  HX of cerebral hemorrhage with mild left-sided chronic weakness.  No acute issues.  PT in the morning.  3.  COPD/asthma.  Remote history of smoking.  Recommended to continue abstain from smoking she quit several months ago, COPD and asthma are stable, supportive care with oxygen and nebulizer treatments as needed.  4.  Generalized weakness and deconditioning.  Plan as a #1 above.  5.  History of seizures with a few breakthrough seizures prior to admission due to Dilantin toxicity.  Dilantin been held, other seizure medications in place, PRN Ativan, currently seizure-free.  6. B12 deficiency - on PO supplement, check level.    Family Communication  :  None  Code Status :  Full  Disposition Plan  :  Home once better  Consults  :  Neuro over the phone  Procedures  :    CT head - non acute  MRI C,T & L Spine -   DVT Prophylaxis  :   Heparin    Lab Results  Component Value Date   PLT 143 (L) 07/18/2018    Diet :  Diet Order             DIET SOFT Room service appropriate? Yes; Fluid consistency: Thin  Diet effective now               Inpatient Medications Scheduled Meds: . aspirin  81 mg Oral Daily  . escitalopram  10 mg Oral Daily  . folic acid  1 mg Oral Daily  . gabapentin  300 mg Oral BID  . heparin  5,000 Units Subcutaneous Q8H  . lacosamide  200 mg Oral BID  . sodium chloride flush  3 mL Intravenous Q12H  . vitamin B-12  250 mcg Oral Daily   Continuous Infusions: . lactated ringers 75 mL/hr at 07/17/18 2251   PRN Meds:.acetaminophen, albuterol, LORazepam, LORazepam, senna-docusate  Antibiotics  :   Anti-infectives (From admission, onward)   None          Objective:   Vitals:   07/17/18 0419 07/17/18 1457 07/17/18 2145 07/18/18 0515  BP: (!) 103/52 (!) 101/50 102/62 (!) 113/55  Pulse: (!) 58 64 64 60  Resp: 18 20    Temp: 97.8 F (36.6 C) 98.6 F (37 C) 98 F (36.7 C) 97.9 F (36.6 C)  TempSrc:  Oral    SpO2: 97% 96% 96% 100%    Wt Readings from Last 3 Encounters:  06/07/18 56.2 kg  05/29/18 83.9 kg  05/26/18 58.3 kg     Intake/Output Summary (Last 24 hours) at 07/18/2018 1003 Last data filed at 07/18/2018 0913 Gross per 24 hour  Intake 767 ml  Output 600 ml  Net 167 ml     Physical Exam  Awake Alert, Oriented X 3, No new F.N deficits, Normal affect [place].AT,PERRAL Supple Neck,No JVD, No cervical lymphadenopathy appriciated.  Symmetrical Chest  wall movement, Good air movement bilaterally, CTAB RRR,No Gallops,Rubs or new Murmurs, No Parasternal Heave +ve B.Sounds, Abd Soft, No tenderness, No organomegaly appriciated, No rebound - guarding or rigidity. No Cyanosis, Clubbing or edema, No new Rash or bruise       Data Review:    CBC Recent Labs  Lab 07/16/18 1142 07/16/18 1956 07/17/18 0222 07/18/18 0851  WBC 3.7* 3.3* 3.8* 3.3*  HGB 12.9 11.4* 11.5* 12.3  HCT 41.0 37.0 36.6 39.4  PLT 140* 127* 127* 143*  MCV 103.3* 104.5* 104.0* 102.3*  MCH 32.5 32.2 32.7 31.9    MCHC 31.5 30.8 31.4 31.2  RDW 12.8 13.0 13.0 12.9  LYMPHSABS 1.4  --   --   --   MONOABS 0.2  --   --   --   EOSABS 0.0  --   --   --   BASOSABS 0.0  --   --   --     Chemistries  Recent Labs  Lab 07/16/18 1142 07/16/18 1956 07/17/18 0222 07/18/18 0851  NA 143  --  143 143  K 3.9  --  3.8 3.8  CL 107  --  110 108  CO2 27  --  30 25  GLUCOSE 92  --  91 92  BUN 7  --  10 9  CREATININE 0.70 0.75 0.76 0.57  CALCIUM 9.4  --  8.3* 8.8*  MG 2.1  --   --   --   AST 17  --  12*  --   ALT 11  --  10  --   ALKPHOS 52  --  49  --   BILITOT 0.4  --  0.3  --    ------------------------------------------------------------------------------------------------------------------ No results for input(s): CHOL, HDL, LDLCALC, TRIG, CHOLHDL, LDLDIRECT in the last 72 hours.  Lab Results  Component Value Date   HGBA1C 5.2 05/24/2018   ------------------------------------------------------------------------------------------------------------------ No results for input(s): TSH, T4TOTAL, T3FREE, THYROIDAB in the last 72 hours.  Invalid input(s): FREET3 ------------------------------------------------------------------------------------------------------------------ No results for input(s): VITAMINB12, FOLATE, FERRITIN, TIBC, IRON, RETICCTPCT in the last 72 hours.  Coagulation profile Recent Labs  Lab 07/17/18 0222  INR 1.09    No results for input(s): DDIMER in the last 72 hours.  Cardiac Enzymes No results for input(s): CKMB, TROPONINI, MYOGLOBIN in the last 168 hours.  Invalid input(s): CK ------------------------------------------------------------------------------------------------------------------    Component Value Date/Time   BNP 55.6 05/23/2018 2254    Micro Results No results found for this or any previous visit (from the past 240 hour(s)).  Radiology Reports Ct Head Wo Contrast  Result Date: 07/16/2018 CLINICAL DATA:  Multiple seizures this week. Now with  tremors and falling. EXAM: CT HEAD WITHOUT CONTRAST TECHNIQUE: Contiguous axial images were obtained from the base of the skull through the vertex without intravenous contrast. COMPARISON:  05/29/2018 FINDINGS: Brain: Examination demonstrates stable encephalomalacia over the right occipital/posterior temporal region. Remaining ventricles, cisterns and CSF spaces are otherwise unchanged and within normal. There is no mass, mass effect, shift of midline structures or acute hemorrhage. No evidence of acute infarction. Vascular: No hyperdense vessel or unexpected calcification. Skull: Previous right posterior parietal/occipital craniotomy. Sinuses/Orbits: No acute finding. Other: None. IMPRESSION: No acute findings. Previous right posterior parietal/occipital craniotomy. 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 Electronically Signed   By: Marin Olp M.D.   On: 07/16/2018 11:29    Time Spent in minutes  30   Lala Lund M.D on 07/18/2018 at 10:03 AM  To page go to  [email or web address] - password [facility]

## 2018-07-18 NOTE — Consult Note (Addendum)
NEURO HOSPITALIST CONSULT NOTE   Requesting physician: Dr. Candiss Norse  Reason for Consult: Bilateral leg weakness  History obtained from:  Patient    HPI:                                                                                                                                          [name] is an 62 y.o. 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 She also c/o generalized weakness. Her Dilantin level had been toxic at 38.2, and it was felt that her presentation was most consistent with Dilantin toxicity (paradoxically, supratherapeutic Dilantin levels can cause seizures), as the tonic-clonic activity resolved after Dilantin was held and her serum levels normalized. Prior to discharge, the patient was evaluated by PT and they noted BLE weakness. Neurology was consulted to evaluate for a possible spinal cord lesion.   On interview by the neurology team, the patient states that she has been having weakness in her BLE. She noticed it after her seizures on 07/16/18. Her first fall was actually 2 days before her seizures started. She states that " it was like my legs just gave away". She says that both legs feel that they are swollen and tight, with a "squeezing sensation". When she does walk she feels like she has a cushion under her feet and toes. She can not really feel the ground that she is walking on. Denies any HA, recent illnesses, any sick contacts, fevers or chills. She endorses back and neck pain. The patient stated that she did not come in after the first fall or seizure because her daughter was leaving on vacation, but now she has gotten sick. She did tell  her daughter to go on vacation anyway.  Seizure history: She is a patient of [name], last seen on 06/07/18. She is treated with Dilantin 160 mg QAM, 200 MG QPM, Vimpat 200 mg BID and Gabapentin 600 mg qhs. Patient has had seizures for over 30 years. Most seizures are focal with left arm and leg shaking and preserved consciousness. On 05/29/18 she began having generalized tonic-clonic activity after having been off of dilantin a few days for Dilantin toxicity which was also diagnosed at that visit.   Per family she is allergic to [place]. She has been seen previously by this service for Dilantin toxicity and breakthrough seizures.  Hospital course: 12/13 admitted with dilantin toxicity, phenytoin level 38.2 CT head: no acute findings. UDS: negative, UA: no UTI 12/14: phenytoin level 26.6,  CK: pending  Past Medical History:  Diagnosis Date  . Asthma   . Cerebral hemorrhage ([place]) 1989  . Chronic back pain   . COPD (chronic obstructive pulmonary disease) ([place])   . DDD (degenerative disc disease) 06/12/2013  . DVT (deep venous thrombosis) ([place])    "she's had several since 1990"  . GERD (gastroesophageal reflux disease)   . Headache    "usually around time when she's had a seizure"  . History of blood transfusion    "when she was a baby"  . History of stomach ulcers   . Kidney stones   . Neuropathy   . Seizures ([place])    "because of her brain surgery"  . Stroke [facility]) [phone]   family denies residual on 11/09/2014  . Tunnel vision    "since brain OR"    Past Surgical History:  Procedure Laterality Date  . [place]   craniotomy with hematoma evacuation  . CESAREAN SECTION  1979; 1987; 1989  . HEMORRHOID SURGERY    . TUBAL LIGATION  1990's    Family History  Problem Relation Age of Onset  . Heart attack Mother        4 MIs, started in her 65s, also vaginal cancer and colon cancer  . Cancer Mother   . Heart attack Father        Died suddenly age 54 - autopsy revealed  heart attack her patient  . Cancer Brother        Sinus cancer    Social History:  reports that she quit smoking about 8 months ago. Her smoking use included cigarettes. She has a 10.00 pack-year smoking history. She has never used smokeless tobacco. She reports that she does not drink alcohol or use drugs.  Allergies  Allergen Reactions  . Zonisamide Other (See Comments)    Numbness and tingling over whole body  . Codeine Nausea And Vomiting  . Keppra [Levetiracetam] Other (See Comments)    Causes seizures  . Pregabalin Nausea And Vomiting  . Olive Oil Rash    MEDICATIONS:                                                                                                                     Scheduled: . escitalopram  10 mg Oral Daily  . gabapentin  300 mg Oral BID  . heparin  5,000 Units Subcutaneous Q8H  . lacosamide  200 mg Oral BID  . sodium chloride flush  3 mL Intravenous Q12H  . vitamin B-12  250 mcg Oral Daily   Continuous: . lactated ringers 75 mL/hr at 07/17/18 2251   TMA:[id number], albuterol, LORazepam, senna-docusate   ROS:  ROS was performed and is negative except as noted in HPI   Blood pressure (!) 113/55, pulse 60, temperature 97.9 F (36.6 C), resp. rate 20, SpO2 100 %.  General Examination:                                                                                                       Physical Exam  HEENT-  Normocephalic, no lesions, without obvious abnormality.  Normal external eye and conjunctiva.   Cardiovascular- S1-S2 audible, pulses palpable throughout   Lungs-no rhonchi or wheezing noted, no excessive working breathing.  Saturations within normal limits on RA Abdomen- All 4 quadrants palpated and nontender Extremities- Warm, dry and intact  Neurological Examination Mental Status: Alert, oriented/month/  name/age/place states year was 2000, thought content appropriate.  Speech fluent without evidence of aphasia.  Able to follow  commands without difficulty. Cranial Nerves: II: left homonymous hemianopsia III,IV, VI: ptosis not present, extra-ocular motions intact bilaterally pupils equal, round, reactive to light and accommodation V,VII: smile symmetric, facial light touch sensation normal bilaterally VIII: hearing normal bilaterally IX,X: uvula rises symmetrically XI: bilateral shoulder shrug XII: midline tongue extension Motor: Right : Upper extremity   5/5  Left:     Upper extremity   5/5  Lower extremity   4/5   Lower extremity   4+/5 Tone and bulk:normal tone throughout; no atrophy noted BLE: right side mildly weaker than her left Does have a BUE tremor at times. It is not persistent. It comes and goes. Sensory: cool and light touch intact BUE. Decreased sensation BLE stocking distribution proximal distal gradient. Proprioception absent on left foot and severely impaired on right foot. Vibratory sensation moderately impaired in bilateral toes. Deep Tendon Reflexes: 2+ right knee jerk 3 + left knee jerk, 2+ bilateral biceps Plantars: Right: downgoing   Left: downgoing Cerebellar: Normal finger-to-nose bilaterally. Unable to perform HTS d/t back pain Gait: deferred   Lab Results: Basic Metabolic Panel: Recent Labs  Lab 07/16/18 1142 07/16/18 1956 07/17/18 0222  NA 143  --  143  K 3.9  --  3.8  CL 107  --  110  CO2 27  --  30  GLUCOSE 92  --  91  BUN 7  --  10  CREATININE 0.70 0.75 0.76  CALCIUM 9.4  --  8.3*  MG 2.1  --   --     CBC: Recent Labs  Lab 07/16/18 1142 07/16/18 1956 07/17/18 0222  WBC 3.7* 3.3* 3.8*  NEUTROABS 2.0  --   --   HGB 12.9 11.4* 11.5*  HCT 41.0 37.0 36.6  MCV 103.3* 104.5* 104.0*  PLT 140* 127* 127*   Imaging: Ct Head Wo Contrast  Result Date: 07/16/2018 CLINICAL DATA:  Multiple seizures this week. Now with tremors and falling. EXAM:  CT HEAD WITHOUT CONTRAST TECHNIQUE: Contiguous axial images were obtained from the base of the skull through the vertex without intravenous contrast. COMPARISON:  05/29/2018 FINDINGS: Brain: Examination demonstrates stable encephalomalacia over the right occipital/posterior temporal region. Remaining ventricles, cisterns and CSF spaces are otherwise unchanged and  within normal. There is no mass, mass effect, shift of midline structures or acute hemorrhage. No evidence of acute infarction. Vascular: No hyperdense vessel or unexpected calcification. Skull: Previous right posterior parietal/occipital craniotomy. Sinuses/Orbits: No acute finding. Other: None. IMPRESSION: No acute findings. Previous right posterior parietal/occipital craniotomy. 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 Electronically Signed   By: Marin Olp M.D.   On: 07/16/2018 11:29    APP assisting with this consult: Laurey Morale, MSN, NP-C, [facility] [phone]   Assessment:  62 year old female with new onset of BLE weakness.  1. Exam findings are suggestive of possible non-lesional etiology such as conversion disorder or factitious disorder. However, to fully rule out a spinal cord lesion, MRI will be needed.  2. Preserved reflexes. GBS is essentially off the DDx. 3. 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 The etiology for her initial presentation was felt most likely to be Dilantin toxicity.   Recommendations: -- MRI of cervical, thoracic and lumbar spine  -- Continue to monitor Dilantin level. The Dilantin dose that she is discharged on should be 50% of the current dose. A Dilantin level should be obtained just  prior to discharge. She should be seen by her neurologist or PCP in 1 week's time following discharge to redraw a Dilantin level.  -- Vitamin B12 and copper levels    I have interviewed and examined the patient. I have formulated the assessment and plan.  Electronically signed: Dr. Kerney Elbe 07/18/2018, 8:30 AM

## 2018-07-18 NOTE — Progress Notes (Signed)
MEDICATION RELATED CONSULT NOTE - FOLLOW UP   Pharmacy Consult for Phenytoin Indication: Hx of seizures  Allergies  Allergen Reactions  . Zonisamide Other (See Comments)    Numbness and tingling over whole body  . Codeine Nausea And Vomiting  . Keppra [Levetiracetam] Other (See Comments)    Causes seizures  . Pregabalin Nausea And Vomiting  . Olive Oil Rash    Labs: Recent Labs    07/16/18 1142 07/16/18 1956 07/17/18 0222 07/18/18 0851  WBC 3.7* 3.3* 3.8* 3.3*  HGB 12.9 11.4* 11.5* 12.3  HCT 41.0 37.0 36.6 39.4  PLT 140* 127* 127* 143*  CREATININE 0.70 0.75 0.76 0.57  MG 2.1  --   --   --   ALBUMIN 4.0  --  2.8*  --   PROT 6.5  --  5.0*  --   AST 17  --  12*  --   ALT 11  --  10  --   ALKPHOS 52  --  49  --   BILITOT 0.4  --  0.3  --    CrCl cannot be calculated (Unknown ideal weight.).    Assessment: 62 yo F presents with multiple seizures this week. Phenytoin level came back elevated at 38.2 on admission.  Phenytoin level today still elevated at 20.7 (corrects to 30 with albumin)  No seizures in patient  Goal of Therapy:  Prevention of seizures Therapeutic levels of 10-60mcg/mL  Plan:  Hold phenytoin Recheck phenytoin level with AM labs on 12/17  Thank you Anette Guarneri, PharmD [phone] 07/18/2018,1:05 PM

## 2018-07-19 ENCOUNTER — Encounter: Payer: Self-pay | Admitting: Occupational Therapy

## 2018-07-19 ENCOUNTER — Ambulatory Visit: Payer: Self-pay

## 2018-07-19 DIAGNOSIS — R7889 Finding of other specified substances, not normally found in blood: Secondary | ICD-10-CM | POA: Diagnosis not present

## 2018-07-19 DIAGNOSIS — J449 Chronic obstructive pulmonary disease, unspecified: Secondary | ICD-10-CM | POA: Diagnosis not present

## 2018-07-19 DIAGNOSIS — G40909 Epilepsy, unspecified, not intractable, without status epilepticus: Secondary | ICD-10-CM | POA: Diagnosis not present

## 2018-07-19 DIAGNOSIS — T420X1A Poisoning by hydantoin derivatives, accidental (unintentional), initial encounter: Secondary | ICD-10-CM | POA: Diagnosis not present

## 2018-07-19 DIAGNOSIS — R531 Weakness: Secondary | ICD-10-CM | POA: Diagnosis not present

## 2018-07-19 DIAGNOSIS — R569 Unspecified convulsions: Secondary | ICD-10-CM | POA: Diagnosis not present

## 2018-07-19 LAB — TROPONIN I
Troponin I: <0.03 ng/mL (ref <0.03)
Troponin I: <0.03 ng/mL (ref <0.03)
Troponin I: <0.03 ng/mL (ref <0.03)

## 2018-07-19 MED ORDER — SODIUM CHLORIDE 0.9 % IV BOLUS
1000.0000 mL | Freq: Once | INTRAVENOUS | Status: AC
  Administered 2018-07-19: 1000 mL via INTRAVENOUS

## 2018-07-19 MED ORDER — PHENYTOIN SODIUM EXTENDED 100 MG PO CAPS: 100.0000 mg | ORAL_CAPSULE | Freq: Two times a day (BID) | ORAL | 0 refills | Status: DC

## 2018-07-19 MED ORDER — PHENYTOIN SODIUM EXTENDED 30 MG PO CAPS: 60.0000 mg | ORAL_CAPSULE | Freq: Two times a day (BID) | ORAL | 0 refills | Status: DC

## 2018-07-19 MED ORDER — B-12 1000 MCG PO TABS: 1000.0000 ug | ORAL_TABLET | Freq: Every day | ORAL | 0 refills | Status: DC

## 2018-07-19 NOTE — Progress Notes (Addendum)
NEUROLOGY PROGRESS NOTE  Subjective: Patient continues to have a sensation of cushions on her feet.  She also complains of severe allodynia when touching her feet during exam.  She also states as though her feet are clenching up.  Exam: Vitals:   07/18/18 2242 07/19/18 0511  BP: (!) 108/48 (!) 97/48  Pulse: 75 (!) 56  Resp:  17  Temp: 98 F (36.7 C) 98.2 F (36.8 C)  SpO2:  97%    Physical Exam   HEENT-  Normocephalic, no lesions, without obvious abnormality.  Normal external eye and conjunctiva.   Extremities- Warm, dry and intact Musculoskeletal-no joint tenderness, tends to hold her toes flexed Skin-warm and dry, no hyperpigmentation, vitiligo, or suspicious lesions    Neuro:  Mental Status: Alert, oriented, thought content appropriate.  Speech fluent without evidence of aphasia.  Able to follow 3 step commands without difficulty. Cranial Nerves: II:  Visual fields grossly normal,  III,IV, VI: ptosis not present, extra-ocular motions intact bilaterally pupils equal, round, reactive to light and accommodation V,VII: smile symmetric, facial light touch sensation normal bilaterally VIII: hearing normal bilaterally IX,X: uvula rises midline XI: bilateral shoulder shrug XII: midline tongue extension Motor: Right : Upper extremity   5/5    Left:     Upper extremity   5/5  Lower extremity   5/5     Lower extremity   5/5  Sensory: Sensation intact throughout upper extremities and thighs.  She has stocking distribution of neuropathy.  Around her left ankle she has severe allodynia along with plantar aspect of feet.  She has no vibratory sensation bilateral ankles but positive vibratory sensation on her knees.  Proprioception appears intact Deep Tendon Reflexes: 2+ on the right upper extremity and in the left upper extremity.  I got 3+ on the left and right lower extremity.  No ankle clonus or ankle reflex.  Patient tends to hold her ankles flexed. Plantars: Right:  downgoing   Left: downgoing Cerebellar: normal finger-to-nose, Tremor- patient continues to have tremor/titbashon of her head along with neck dystonic posturing to the right.  Positive postural and endpoint tremor bilaterally.    Medications:  Scheduled: . aspirin  81 mg Oral Daily  . cyanocobalamin  1,000 mcg Intramuscular Daily  . escitalopram  10 mg Oral Daily  . folic acid  1 mg Oral Daily  . gabapentin  300 mg Oral BID  . heparin  5,000 Units Subcutaneous Q8H  . lacosamide  200 mg Oral BID  . sodium chloride flush  3 mL Intravenous Q12H   Continuous: . sodium chloride     XTG:GYIRSWNIOEVOJ, albuterol, LORazepam, nitroGLYCERIN, senna-docusate  Pertinent Labs/Diagnostics: Pharmacy following Dilantin and Dilantin level trending down.    Mr [name] and cervical spine  Result Date: 07/18/2018 CLINICAL DATA: IMPRESSION: 1. Confluent marrow edema in the posterior right 8th, 9th, and 10th ribs. The contiguous pattern of involvement suggests sequelae of trauma. No rib fractures were evident on the 05/23/2018 Chest CTA. Perhaps these are posttraumatic bone contusions. There are trace layering pleural effusions. 2. No other acute osseous abnormality identified in the cervical, thoracic, or lumbar spine. 3. No convincing cervical or thoracic spinal cord abnormality. 4. Mild degenerative cervical spinal stenosis at C5-C6 and C6-C7 with moderate or severe bilateral foraminal stenosis. No thoracic spinal or foraminal stenosis. Stable borderline to mild lumbar spinal stenosis since the lumbar MRI on 03/20/2018. Electronically Signed   By: Genevie Ann M.D.   On: 07/18/2018 14:26  Etta Quill PA-C [facility] [phone]   Assessment: -62 year old female with new onset bilateral weakness and decreased sensation.  MRI cervical spine and brain showed no neurological etiology for her symptoms.  She does have significant stocking distribution of peripheral  neuropathy.  At this point time no weakness is noted.   Impression:  -Lower extremity weakness likely nonphysiological nature- as today when discussed having her go to a nursing home for further evaluation she was able to walk without a problem per Dr. Candiss Norse  Recommendations: -Resume dilantin at lower dose of 150 mg BID -- Spoke to Dr Delice Lesch for expedited follow up   07/19/2018, 9:59 AM  NEUROHOSPITALIST ADDENDUM Performed a face to face diagnostic evaluation.   I have reviewed the contents of history and physical exam as documented by PA/ARNP/Resident and agree with above documentation.  I have discussed and formulated the above plan as documented. Edits to the note have been made as needed.   Patient presented with ataxia, difficulty walking likely due to Dilantin toxicity.  Patient also has peripheral neuropathy on examination which is making things worse.  MRI versus spine negative for any acute process.   Informed patient's neurologist Dr. Ellouise Newer  was admitted again for Dilantin toxicity.  This appears to be her fourth admission either for Dilantin toxicity or subtherapeutic level causing seizure.  She is a complex history and has tried multiple AEDs in the past and therefore we will not make significant changes as inpatient.  Recommend restarting Dilantin at a lower level such as 150 mg twice daily.    Karena Addison Rondle Lohse MD [facility] [phone]   If 7pm to 7am, please call on call as listed on AMION.

## 2018-07-19 NOTE — Consult Note (Signed)
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 Per report patient lives with daughter, son-in-law and ex mother in law. Independent prior to admission. Two-level home with bedroom upstairs. Patient with recent admission a few months ago with breakthrough seizures. Presented 07/16/2018 with tonic-clonic seizure activity as well as reports of poor balance and a few falls. Findings of Dilantin toxicity 38.2.Cranial CT reviewed, unremarkable for acute intracranial process. MRI and imaging of cervical thoracic and lumbar spine without acute findings. Her Dilantin remains on hold after follow-up per neurology services she does continue on Vimpat as prior to admission. Subcutaneous heparin for DVT prophylaxis. Therapy evaluations completed with recommendations of physical medicine rehabilitation consult.   Review of Systems  Constitutional: Negative for chills and fever.  HENT: Negative for hearing loss.   Eyes: Negative for blurred vision and double vision.  Respiratory: Positive for shortness of breath.   Cardiovascular: Negative for chest pain, palpitations and leg swelling.  Gastrointestinal: Positive for constipation. Negative for nausea and vomiting.       GERD  Genitourinary: Negative for dysuria and flank pain.  Musculoskeletal: Positive for back pain, myalgias and neck pain.  Skin: Negative for rash.  Neurological: Positive for tingling, sensory change, seizures, weakness and headaches.  All other systems reviewed and are negative.  Past Medical History:  Diagnosis Date  . Asthma   . 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 Chronic  back pain   . COPD (chronic obstructive pulmonary disease) ([place])   . DDD (degenerative disc disease) 06/12/2013  . DVT (deep venous thrombosis) ([place])    "she's had several since 1990"  . GERD (gastroesophageal reflux disease)   . Headache    "usually around time when she's had a seizure"  . History of blood transfusion    "when she was a baby"  . History of stomach ulcers   . Kidney stones   . Neuropathy   . Seizures ([place])    "because of her brain surgery"  . Stroke [facility]) [phone]   family denies residual on 11/09/2014  . Tunnel vision    "since brain OR"   Past Surgical History:  Procedure Laterality Date  . [place]   craniotomy with hematoma evacuation  . CESAREAN SECTION  1979; 1987; 1989  . HEMORRHOID SURGERY    . TUBAL LIGATION  1990's   Family History  Problem Relation Age of Onset  . Heart attack Mother        4 MIs, started in her 50s, also vaginal cancer and colon cancer  . Cancer Mother   . Heart attack Father        Died suddenly age 67 - autopsy revealed heart attack her patient  . Cancer Brother        Sinus cancer   Social History:  reports that she quit smoking about 8 months ago. Her smoking use included cigarettes. She has a 10.00 pack-year smoking history. She has never used smokeless tobacco. She reports that she does not drink alcohol or use drugs. Allergies:  Allergies  Allergen Reactions  . Zonisamide Other (See Comments)    Numbness and tingling over whole body  .  Codeine Nausea And Vomiting  . Keppra [Levetiracetam] Other (See Comments)    Causes seizures  . Pregabalin Nausea And Vomiting  . Olive Oil Rash   Medications Prior to Admission  Medication Sig Dispense Refill  . albuterol (PROVENTIL HFA;VENTOLIN HFA) 108 (90 Base) MCG/ACT inhaler Inhale 2 puffs into the lungs every 6 (six) hours as needed for wheezing or shortness of breath.    . cholecalciferol (VITAMIN D3) 25 MCG (1000 UT) tablet Take 1,000 Units by mouth 2 (two) times  daily.    Marland Kitchen escitalopram (LEXAPRO) 10 MG tablet Take 1 tablet (10 mg total) by mouth daily. (Patient taking differently: Take 10 mg by mouth at bedtime. ) 90 tablet 3  . gabapentin (NEURONTIN) 300 MG capsule Take 1 capsule (300 mg total) by mouth 2 (two) times daily. (Patient taking differently: Take 600 mg by mouth at bedtime. )    . lacosamide (VIMPAT) 200 MG TABS tablet Take 1 tablet (200 mg total) by mouth 2 (two) times daily. 180 tablet 3  . phenytoin (DILANTIN) 100 MG ER capsule Take 1 Capsule each morning and 2 capsules each night (Patient taking differently: Take 100-200 mg by mouth See admin instructions. Take one capsule (100 mg) by mouth every morning (with two 30 mg capsules for a morning dose of 160 mg)  and two capsules (200 mg) at night) 270 capsule 3  . phenytoin (DILANTIN) 30 MG ER capsule Take 2 Capsules each morning with (1) 100mg  Capsule for a total of 160mg  (Patient taking differently: Take 60 mg by mouth See admin instructions. Take two capsules (60 mg) by mouth every morning with a 100 mg capsule for a total morning dose of 160 mg) 180 capsule 3  . vitamin B-12 (CYANOCOBALAMIN) 250 MCG tablet Take 1 tablet (250 mcg total) by mouth daily.      Home: Home Living Family/patient expects to be discharged to:: Private residence Living Arrangements: Children, Other relatives Available Help at Discharge: Family, Available PRN/intermittently Type of Home: House Home Access: Level entry Home Layout: Two level Alternate Level Stairs-Number of Steps: 15 Bathroom Shower/Tub: Tub/shower unit, Architectural technologist: Standard Home Equipment: None  Functional History: Prior Function Level of Independence: Independent Functional Status:  Mobility: Bed Mobility Overal bed mobility: Needs Assistance Bed Mobility: Supine to Sit, Sit to Supine Supine to sit: Max assist Sit to supine: Max assist General bed mobility comments: cues for sequencing, assist with BLE off bed and to  elevate trunk, use of bed pad to scoot to EOB Transfers Overall transfer level: Needs assistance Equipment used: Rolling walker (2 wheeled) Transfers: Sit to/from Stand Sit to Stand: Mod assist General transfer comment: mod assist to power up, tolerated static stand ~ 20 sec. Return to EOB required due to pain. Ambulation/Gait General Gait Details: unable due to pain    ADL:    Cognition: Cognition Overall Cognitive Status: History of cognitive impairments - at baseline Orientation Level: Oriented X4 Cognition Arousal/Alertness: Awake/alert Behavior During Therapy: Anxious Overall Cognitive Status: History of cognitive impairments - at baseline  Blood pressure (!) 97/48, pulse (!) 56, temperature 98.2 F (36.8 C), temperature source Oral, resp. rate 17, SpO2 97 %. Physical Exam  Vitals reviewed. Constitutional: She is oriented to person, place, and time. She appears well-developed.  Frail  HENT:  Head: Normocephalic and atraumatic.  Eyes: EOM are normal. Right eye exhibits no discharge. Left eye exhibits no discharge.  Neck: Normal range of motion. Neck supple. No thyromegaly present.  Cardiovascular: Normal rate  and regular rhythm.  Respiratory: Effort normal and breath sounds normal. No respiratory distress.  GI: Soft. Bowel sounds are normal. She exhibits no distension.  Musculoskeletal:     Comments: No edema or tenderness in extremities  Neurological: She is alert and oriented to person, place, and time.  Follows commands Motor: 4-/5 throughout (left slightly weaker than right) Generalized tremors  Skin: Skin is warm and dry.  Psychiatric: Her speech is normal. Her mood appears anxious. Her affect is blunt.    Results for orders placed or performed during the hospital encounter of 07/16/18 (from the past 24 hour(s))  Phenytoin level, total     Status: Abnormal   Collection Time: 07/18/18  8:51 AM  Result Value Ref Range   Phenytoin Lvl 20.7 (H) 10.0 - 20.0 ug/mL   Basic metabolic panel     Status: Abnormal   Collection Time: 07/18/18  8:51 AM  Result Value Ref Range   Sodium 143 135 - 145 mmol/L   Potassium 3.8 3.5 - 5.1 mmol/L   Chloride 108 98 - 111 mmol/L   CO2 25 22 - 32 mmol/L   Glucose, Bld 92 70 - 99 mg/dL   BUN 9 6 - 20 mg/dL   Creatinine, Ser 0.57 0.44 - 1.00 mg/dL   Calcium 8.8 (L) 8.9 - 10.3 mg/dL   GFR calc non Af Amer >60 >60 mL/min   GFR calc Af Amer >60 >60 mL/min   Anion gap 10 5 - 15  CBC     Status: Abnormal   Collection Time: 07/18/18  8:51 AM  Result Value Ref Range   WBC 3.3 (L) 4.0 - 10.5 K/uL   RBC 3.85 (L) 3.87 - 5.11 MIL/uL   Hemoglobin 12.3 12.0 - 15.0 g/dL   HCT 39.4 36.0 - 46.0 %   MCV 102.3 (H) 80.0 - 100.0 fL   MCH 31.9 26.0 - 34.0 pg   MCHC 31.2 30.0 - 36.0 g/dL   RDW 12.9 11.5 - 15.5 %   Platelets 143 (L) 150 - 400 K/uL   nRBC 0.0 0.0 - 0.2 %  CK     Status: None   Collection Time: 07/18/18  8:51 AM  Result Value Ref Range   Total CK 38 38 - 234 U/L  TSH     Status: None   Collection Time: 07/18/18 11:25 AM  Result Value Ref Range   TSH 1.315 0.350 - 4.500 uIU/mL  Vitamin B12     Status: None   Collection Time: 07/18/18 11:25 AM  Result Value Ref Range   Vitamin B-12 269 180 - 914 pg/mL  Troponin I - Now Then Q6H     Status: None   Collection Time: 07/18/18 11:37 PM  Result Value Ref Range   Troponin I <0.03 <0.03 ng/mL   Mr Cervical Spine Wo Contrast  Result Date: 07/18/2018 CLINICAL DATA:  62 year old female with history of seizures. Unexplained left-side and lower extremity heaviness. Pain radiating from the right neck to the flank. EXAM: MRI TOTAL SPINE WITHOUT CONTRAST TECHNIQUE: Multisequence MR imaging of the spine from the cervical spine to the sacrum was performed without IV contrast administration. COMPARISON:  Head CT without contrast 07/16/2018. Brain MRI 05/31/2018. Lumbar MRI 03/20/2018. Chest CTA 05/23/2018. Cervical spine CT 05/29/2018. FINDINGS: MRI CERVICAL SPINE FINDINGS  Alignment: Stable cervical lordosis compared to the October CT. Vertebrae: No marrow edema or evidence of acute osseous abnormality. Degenerative endplate marrow signal changes at C5-C6 and C6-C7. Cord: Spinal cord signal is  within normal limits at all visualized levels. Posterior Fossa, vertebral arteries, paraspinal tissues: Cervicomedullary junction is within normal limits. Large area of posterior right hemisphere encephalomalacia redemonstrated. Preserved major vascular flow voids in the neck. Negative neck soft tissues and visible lung apices. Disc levels: C2-C3:  Mild disc bulge.  Mild facet hypertrophy.  No stenosis. C3-C4: Mild disc bulge. Mild to moderate facet hypertrophy greater on the left. Mild to moderate ligament flavum hypertrophy. No spinal stenosis. Mild left C4 foraminal stenosis. C4-C5: Subtle anterolisthesis with moderate to severe left and mild-to-moderate right facet hypertrophy. Mild to moderate ligament flavum hypertrophy. Mild disc bulge and endplate spurring primarily affecting the left foramen. No significant spinal stenosis. Moderate to severe left and mild right C5 foraminal stenosis. C5-C6: Disc space loss with right eccentric circumferential disc osteophyte complex. Mild facet and ligament flavum hypertrophy. Spinal stenosis. No definite spinal cord mass effect. Mild to moderate left and severe right C6 foraminal stenosis. C6-C7: Disc space loss with circumferential disc osteophyte complex. Mild facet and ligament flavum hypertrophy. Mild spinal stenosis, no cord mass effect. Moderate bilateral C7 foraminal stenosis. C7-T1: Small right paracentral disc protrusion. Mild facet and ligament flavum hypertrophy. No stenosis. MRI THORACIC SPINE FINDINGS Segmentation:  Normal on the comparison CT. Alignment: Stable mildly exaggerated thoracic kyphosis since October. No spondylolisthesis. Vertebrae: Mild chronic endplate changes in the lower thoracic spine, most pronounced at T12. No  vertebral marrow edema. Benign T7 vertebral body hemangioma. However, there is confluent marrow edema in the posterior right 8th, 9th, and 10th ribs (series 25 image 4, series 26 image 3, series 28 images 21 through 27). No rib fracture here was evident in October. Marrow signal in the other posterior ribs appears normal. Cord: No convincing thoracic spinal cord signal spinal canal abnormality. Capacious spinal canal. The conus medullaris appears normal at T12-L1. Paraspinal and other soft tissues: Small or trace layering pleural effusions. Otherwise negative visible thoracic and upper abdominal viscera Disc levels: No thoracic spinal stenosis. Small right paracentral disc protrusion or endplate osteophyte at E0-C1. Mild thoracic posterior element hypertrophy, no thoracic neural foraminal stenosis. MRI LUMBAR SPINE FINDINGS Segmentation: Normal, concordant with the other spinal numbering and the same numbering system used on the August MRI. Alignment: Stable lumbar lordosis since August. Subtle retrolisthesis of L3 on L4 and L4 on L5 is stable. Vertebrae: No marrow edema or evidence of acute osseous abnormality. Stable marrow signal. Intact visible sacrum and SI joints. Conus medullaris: Extends to the L1 level. No lower spinal cord or conus signal abnormality. Cauda equina nerve roots appear stable and normal. Paraspinal and other soft tissues: Stable and negative visible abdominal viscera. Negative visualized posterior paraspinal soft tissues. Disc levels: Unchanged since August. Borderline to mild multifactorial spinal stenosis at L2-L3 and L3-L4 related to circumferential disc bulge and posterior element hypertrophy. IMPRESSION: 1. Confluent marrow edema in the posterior right 8th, 9th, and 10th ribs. The contiguous pattern of involvement suggests sequelae of trauma. No rib fractures were evident on the 05/23/2018 Chest CTA. Perhaps these are posttraumatic bone contusions. There are trace layering pleural  effusions. 2. No other acute osseous abnormality identified in the cervical, thoracic, or lumbar spine. 3. No convincing cervical or thoracic spinal cord abnormality. 4. Mild degenerative cervical spinal stenosis at C5-C6 and C6-C7 with moderate or severe bilateral foraminal stenosis. No thoracic spinal or foraminal stenosis. Stable borderline to mild lumbar spinal stenosis since the lumbar MRI on 03/20/2018. Electronically Signed   By: Genevie Ann M.D.   On:  07/18/2018 14:26   Mr Thoracic Spine Wo Contrast  Result Date: 07/18/2018 CLINICAL DATA:  62 year old female with history of seizures. Unexplained left-side and lower extremity heaviness. Pain radiating from the right neck to the flank. EXAM: MRI TOTAL SPINE WITHOUT CONTRAST TECHNIQUE: Multisequence MR imaging of the spine from the cervical spine to the sacrum was performed without IV contrast administration. COMPARISON:  Head CT without contrast 07/16/2018. Brain MRI 05/31/2018. Lumbar MRI 03/20/2018. Chest CTA 05/23/2018. Cervical spine CT 05/29/2018. FINDINGS: MRI CERVICAL SPINE FINDINGS Alignment: Stable cervical lordosis compared to the October CT. Vertebrae: No marrow edema or evidence of acute osseous abnormality. Degenerative endplate marrow signal changes at C5-C6 and C6-C7. Cord: Spinal cord signal is within normal limits at all visualized levels. Posterior Fossa, vertebral arteries, paraspinal tissues: Cervicomedullary junction is within normal limits. Large area of posterior right hemisphere encephalomalacia redemonstrated. Preserved major vascular flow voids in the neck. Negative neck soft tissues and visible lung apices. Disc levels: C2-C3:  Mild disc bulge.  Mild facet hypertrophy.  No stenosis. C3-C4: Mild disc bulge. Mild to moderate facet hypertrophy greater on the left. Mild to moderate ligament flavum hypertrophy. No spinal stenosis. Mild left C4 foraminal stenosis. C4-C5: Subtle anterolisthesis with moderate to severe left and  mild-to-moderate right facet hypertrophy. Mild to moderate ligament flavum hypertrophy. Mild disc bulge and endplate spurring primarily affecting the left foramen. No significant spinal stenosis. Moderate to severe left and mild right C5 foraminal stenosis. C5-C6: Disc space loss with right eccentric circumferential disc osteophyte complex. Mild facet and ligament flavum hypertrophy. Spinal stenosis. No definite spinal cord mass effect. Mild to moderate left and severe right C6 foraminal stenosis. C6-C7: Disc space loss with circumferential disc osteophyte complex. Mild facet and ligament flavum hypertrophy. Mild spinal stenosis, no cord mass effect. Moderate bilateral C7 foraminal stenosis. C7-T1: Small right paracentral disc protrusion. Mild facet and ligament flavum hypertrophy. No stenosis. MRI THORACIC SPINE FINDINGS Segmentation:  Normal on the comparison CT. Alignment: Stable mildly exaggerated thoracic kyphosis since October. No spondylolisthesis. Vertebrae: Mild chronic endplate changes in the lower thoracic spine, most pronounced at T12. No vertebral marrow edema. Benign T7 vertebral body hemangioma. However, there is confluent marrow edema in the posterior right 8th, 9th, and 10th ribs (series 25 image 4, series 26 image 3, series 28 images 21 through 27). No rib fracture here was evident in October. Marrow signal in the other posterior ribs appears normal. Cord: No convincing thoracic spinal cord signal spinal canal abnormality. Capacious spinal canal. The conus medullaris appears normal at T12-L1. Paraspinal and other soft tissues: Small or trace layering pleural effusions. Otherwise negative visible thoracic and upper abdominal viscera Disc levels: No thoracic spinal stenosis. Small right paracentral disc protrusion or endplate osteophyte at Y8-X4. Mild thoracic posterior element hypertrophy, no thoracic neural foraminal stenosis. MRI LUMBAR SPINE FINDINGS Segmentation: Normal, concordant with the  other spinal numbering and the same numbering system used on the August MRI. Alignment: Stable lumbar lordosis since August. Subtle retrolisthesis of L3 on L4 and L4 on L5 is stable. Vertebrae: No marrow edema or evidence of acute osseous abnormality. Stable marrow signal. Intact visible sacrum and SI joints. Conus medullaris: Extends to the L1 level. No lower spinal cord or conus signal abnormality. Cauda equina nerve roots appear stable and normal. Paraspinal and other soft tissues: Stable and negative visible abdominal viscera. Negative visualized posterior paraspinal soft tissues. Disc levels: Unchanged since August. Borderline to mild multifactorial spinal stenosis at L2-L3 and L3-L4 related to circumferential disc  bulge and posterior element hypertrophy. IMPRESSION: 1. Confluent marrow edema in the posterior right 8th, 9th, and 10th ribs. The contiguous pattern of involvement suggests sequelae of trauma. No rib fractures were evident on the 05/23/2018 Chest CTA. Perhaps these are posttraumatic bone contusions. There are trace layering pleural effusions. 2. No other acute osseous abnormality identified in the cervical, thoracic, or lumbar spine. 3. No convincing cervical or thoracic spinal cord abnormality. 4. Mild degenerative cervical spinal stenosis at C5-C6 and C6-C7 with moderate or severe bilateral foraminal stenosis. No thoracic spinal or foraminal stenosis. Stable borderline to mild lumbar spinal stenosis since the lumbar MRI on 03/20/2018. Electronically Signed   By: Genevie Ann M.D.   On: 07/18/2018 14:26   Mr [name]  Result Date: 07/18/2018 CLINICAL DATA:  62 year old female with history of seizures. Unexplained left-side and lower extremity heaviness. Pain radiating from the right neck to the flank. EXAM: MRI TOTAL SPINE WITHOUT CONTRAST TECHNIQUE: Multisequence MR imaging of the spine from the cervical spine to the sacrum was performed without IV contrast administration.  COMPARISON:  Head CT without contrast 07/16/2018. Brain MRI 05/31/2018. Lumbar MRI 03/20/2018. Chest CTA 05/23/2018. Cervical spine CT 05/29/2018. FINDINGS: MRI CERVICAL SPINE FINDINGS Alignment: Stable cervical lordosis compared to the October CT. Vertebrae: No marrow edema or evidence of acute osseous abnormality. Degenerative endplate marrow signal changes at C5-C6 and C6-C7. Cord: Spinal cord signal is within normal limits at all visualized levels. Posterior Fossa, vertebral arteries, paraspinal tissues: Cervicomedullary junction is within normal limits. Large area of posterior right hemisphere encephalomalacia redemonstrated. Preserved major vascular flow voids in the neck. Negative neck soft tissues and visible lung apices. Disc levels: C2-C3:  Mild disc bulge.  Mild facet hypertrophy.  No stenosis. C3-C4: Mild disc bulge. Mild to moderate facet hypertrophy greater on the left. Mild to moderate ligament flavum hypertrophy. No spinal stenosis. Mild left C4 foraminal stenosis. C4-C5: Subtle anterolisthesis with moderate to severe left and mild-to-moderate right facet hypertrophy. Mild to moderate ligament flavum hypertrophy. Mild disc bulge and endplate spurring primarily affecting the left foramen. No significant spinal stenosis. Moderate to severe left and mild right C5 foraminal stenosis. C5-C6: Disc space loss with right eccentric circumferential disc osteophyte complex. Mild facet and ligament flavum hypertrophy. Spinal stenosis. No definite spinal cord mass effect. Mild to moderate left and severe right C6 foraminal stenosis. C6-C7: Disc space loss with circumferential disc osteophyte complex. Mild facet and ligament flavum hypertrophy. Mild spinal stenosis, no cord mass effect. Moderate bilateral C7 foraminal stenosis. C7-T1: Small right paracentral disc protrusion. Mild facet and ligament flavum hypertrophy. No stenosis. MRI THORACIC SPINE FINDINGS Segmentation:  Normal on the comparison CT. Alignment:  Stable mildly exaggerated thoracic kyphosis since October. No spondylolisthesis. Vertebrae: Mild chronic endplate changes in the lower thoracic spine, most pronounced at T12. No vertebral marrow edema. Benign T7 vertebral body hemangioma. However, there is confluent marrow edema in the posterior right 8th, 9th, and 10th ribs (series 25 image 4, series 26 image 3, series 28 images 21 through 27). No rib fracture here was evident in October. Marrow signal in the other posterior ribs appears normal. Cord: No convincing thoracic spinal cord signal spinal canal abnormality. Capacious spinal canal. The conus medullaris appears normal at T12-L1. Paraspinal and other soft tissues: Small or trace layering pleural effusions. Otherwise negative visible thoracic and upper abdominal viscera Disc levels: No thoracic spinal stenosis. Small right paracentral disc protrusion or endplate osteophyte at D9-M4. Mild thoracic posterior element hypertrophy, no thoracic  neural foraminal stenosis. MRI LUMBAR SPINE FINDINGS Segmentation: Normal, concordant with the other spinal numbering and the same numbering system used on the August MRI. Alignment: Stable lumbar lordosis since August. Subtle retrolisthesis of L3 on L4 and L4 on L5 is stable. Vertebrae: No marrow edema or evidence of acute osseous abnormality. Stable marrow signal. Intact visible sacrum and SI joints. Conus medullaris: Extends to the L1 level. No lower spinal cord or conus signal abnormality. Cauda equina nerve roots appear stable and normal. Paraspinal and other soft tissues: Stable and negative visible abdominal viscera. Negative visualized posterior paraspinal soft tissues. Disc levels: Unchanged since August. Borderline to mild multifactorial spinal stenosis at L2-L3 and L3-L4 related to circumferential disc bulge and posterior element hypertrophy. IMPRESSION: 1. Confluent marrow edema in the posterior right 8th, 9th, and 10th ribs. The contiguous pattern of  involvement suggests sequelae of trauma. No rib fractures were evident on the 05/23/2018 Chest CTA. Perhaps these are posttraumatic bone contusions. There are trace layering pleural effusions. 2. No other acute osseous abnormality identified in the cervical, thoracic, or lumbar spine. 3. No convincing cervical or thoracic spinal cord abnormality. 4. Mild degenerative cervical spinal stenosis at C5-C6 and C6-C7 with moderate or severe bilateral foraminal stenosis. No thoracic spinal or foraminal stenosis. Stable borderline to mild lumbar spinal stenosis since the lumbar MRI on 03/20/2018. Electronically Signed   By: Genevie Ann M.D.   On: 07/18/2018 14:26    Assessment/Plan: Diagnosis: Dilantin toxicity Labs and images (see above) independently reviewed.  Records reviewed and summated above.  1. Does the need for close, 24 hr/day medical supervision in concert with the patient's rehab needs make it unreasonable for this patient to be served in a less intensive setting? Potentially  2. 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 Due to safety, disease management, pain management and patient education, does the patient require 24 hr/day rehab nursing? Potentially 4. Does the patient require coordinated care of a physician, rehab nurse, PT (1-2 hrs/day, 5 days/week) and OT (1-2 hrs/day, 5 days/week) to address physical and functional deficits in the context of the above medical diagnosis(es)? Potentially Addressing deficits in the following areas: balance, endurance, locomotion, strength, transferring, bathing, dressing, toileting and psychosocial support 5. Can the patient actively participate in an intensive therapy program of at least 3 hrs of therapy per day at least 5 days per week? Yes 6. The potential for patient to make  measurable gains while on inpatient rehab is good 7. Anticipated functional outcomes upon discharge from inpatient rehab are supervision  with PT, supervision with OT, n/a with SLP. 8. Estimated rehab length of stay to reach the above functional goals is: NA 9. Anticipated D/C setting: Other 10. Anticipated post D/C treatments: SNF 11. Overall Rehab/Functional Prognosis: good  RECOMMENDATIONS: This patient's condition is appropriate for continued rehabilitative care in the following setting: Anticipate function will improve as dilantin is cleared from system if this is related to medication toxicity. Pt endoresed similar complaints of neuropathy and weakness on early admissions this year as well, recommend further workup if warranted. Recommend SNF if caregivers unavailable. Patient has agreed to participate in recommended program. Potentially Note that insurance prior authorization may be required for reimbursement for recommended care.  Comment: Rehab Admissions Coordinator to follow up.   I have personally performed a face to face diagnostic evaluation, including, but not limited  to relevant history and physical exam findings, of this patient and developed relevant assessment and plan.  Additionally, I have reviewed and concur with the physician assistant's documentation above.   Delice Lesch, MD, ABPMR Lavon Paganini Angiulli, PA-C 07/19/2018

## 2018-07-19 NOTE — Social Work (Signed)
CSW noting that pt is refusing SNF, however will follow as needed.  Westley Hummer, MSW, [place] Work [phone]

## 2018-07-19 NOTE — Progress Notes (Signed)
Physical Therapy Treatment Patient Details Name: [name] MRN: [id number] DOB: 03-18-1956 Today's Date: 07/19/2018    History of Present Illness Pt is a 62 y.o. 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  She was admitted to the hospital a few months ago with breakthrough seizures when she was taken off of Dilantin. She presented to the ER 07/16/18 with 3 to 4-day history of tonic-clonic activity with falls suspicious for seizures at home.     PT Comments    Pt received in recliner, agreeable to participation in therapy. Pt reports pain and weakness have improved but are still present. She required min assist transfers, min assist ambulation 300 feet with RW, and min assist ascend/descend 3 steps with R rail. Increased time required for all mobility, with pt demonstrating very slow cadence. She presents with jerking/tremulous movement during standing/ambulation that subsides with distraction. Pt continually asking during session "what has caused me to be like this."  Pt states she has never needed a RW or any assist with mobility in the past.  This is discredited by chart review as multiple admission with PT intervention noted this year. Pt states she is agreeable to CIR but not SNF. If pt does not qualify for CIR and pt refuses SNF, she would at minimum require HHPT. Pt reports her daughter is out of town x 3 weeks and she would be home alone.   Follow Up Recommendations  CIR     Equipment Recommendations  Rolling walker with 5" wheels    Recommendations for Other Services Rehab consult     Precautions / Restrictions Precautions Precautions: Fall Restrictions Weight Bearing Restrictions: No    Mobility  Bed Mobility Overal bed mobility: Needs Assistance Bed Mobility: Supine to Sit      Supine to sit: Min assist;HOB elevated     General bed mobility comments: +rail  Transfers Overall transfer level: Needs assistance Equipment used: Rolling walker (2 wheeled) Transfers: Sit to/from Stand Sit to Stand: Min assist         General transfer comment: increased time and effort, assist to power up  Ambulation/Gait Ambulation/Gait assistance: Min assist Gait Distance (Feet): 300 Feet Assistive device: Rolling walker (2 wheeled) Gait Pattern/deviations: Step-through pattern;Decreased stride length;Narrow base of support Gait velocity: very, very slow. Pt required 2 standing rest breaks. Gait velocity interpretation: <1.8 ft/sec, indicate of risk for recurrent falls General Gait Details: tremulous/jerking during ambulation. Rhythmic pattern that worsens when therapist assists with stabilizing RW and improves/disappears with distraction. Pt bearing down heavily on RW.    Stairs Stairs: Yes Stairs assistance: Min assist Stair Management: One rail Right;Sideways;Step to pattern Number of Stairs: 3 General stair comments: cues for sequencing and safety. Unsafe to attempt > 3 steps as pt with increasingly flexed posture (trunk and BLE) with ascent.    Wheelchair Mobility    Modified Rankin (Stroke Patients Only)       Balance Overall balance assessment: Needs assistance Sitting-balance support: No upper extremity supported;Feet supported Sitting balance-Leahy Scale: Good     Standing balance support: During functional activity;Bilateral upper extremity supported Standing balance-Leahy Scale: Poor Standing balance comment: heavy reliance on UE support                            Cognition Arousal/Alertness:  Awake/alert Behavior During Therapy: WFL for tasks assessed/performed Overall Cognitive Status: History of cognitive impairments - at baseline                                        Exercises      General Comments         Pertinent Vitals/Pain Pain Assessment: Faces Faces Pain Scale: Hurts little more Pain Location: back Pain Descriptors / Indicators: Sore Pain Intervention(s): Monitored during session;Repositioned    Home Living                      Prior Function            PT Goals (current goals can now be found in the care plan section) Acute Rehab PT Goals Patient Stated Goal: figure out why her legs are weak and numb PT Goal Formulation: With patient Time For Goal Achievement: 07/31/18 Potential to Achieve Goals: Good Progress towards PT goals: Progressing toward goals    Frequency    Min 3X/week      PT Plan Current plan remains appropriate    Co-evaluation              AM-PAC PT "6 Clicks" Mobility   Outcome Measure  Help needed turning from your back to your side while in a flat bed without using bedrails?: A Little Help needed moving from lying on your back to sitting on the side of a flat bed without using bedrails?: A Little Help needed moving to and from a bed to a chair (including a wheelchair)?: A Little Help needed standing up from a chair using your arms (e.g., wheelchair or bedside chair)?: A Little Help needed to walk in hospital room?: A Little Help needed climbing 3-5 steps with a railing? : A Lot 6 Click Score: 17    End of Session Equipment Utilized During Treatment: Gait belt Activity Tolerance: Patient tolerated treatment well Patient left: in chair;with call bell/phone within reach;with chair alarm set Nurse Communication: Mobility status PT Visit Diagnosis: Other abnormalities of gait and mobility (R26.89);Unsteadiness on feet (R26.81);Muscle weakness (generalized) (M62.81);Pain     Time: 7544-9201 PT Time Calculation (min) (ACUTE ONLY): 36 min  Charges:  $Gait Training: 23-37 mins                     [name], [place]  Office # [phone] Pager [phone]    [name] 07/19/2018, 12:54 PM

## 2018-07-19 NOTE — Discharge Instructions (Signed)
Follow with Primary MD [name], MD in 5 days   Get CBC, CMP, B12, Dilantin levels-  checked  by Primary MD in 5  days   Activity: As tolerated with Full fall precautions use walker/cane & assistance as needed  Disposition Home    Diet: Heart Healthy    Special Instructions: If you have smoked or chewed Tobacco  in the last 2 yrs please stop smoking, stop any regular Alcohol  and or any Recreational drug use.  On your next visit with your primary care physician please Get Medicines reviewed and adjusted.  Please request your Prim.MD to go over all Hospital Tests and Procedure/Radiological results at the follow up, please get all Hospital records sent to your Prim MD by signing hospital release before you go home.  If you experience worsening of your admission symptoms, develop shortness of breath, life threatening emergency, suicidal or homicidal thoughts you must seek medical attention immediately by calling 911 or calling your MD immediately  if symptoms less severe.  You Must read complete instructions/literature along with all the possible adverse reactions/side effects for all the Medicines you take and that have been prescribed to you. Take any new Medicines after you have completely understood and accpet all the possible adverse reactions/side effects.   Do not drive, operate heavy machinery, perform activities at heights, swimming or participation in water activities or provide baby sitting services if your were admitted for syncope or siezures until you have seen by Primary MD or a Neurologist and advised to do so again.  Do not drive when taking Pain medications.  Do not take more than prescribed Pain, Sleep and Anxiety Medications  Wear Seat belts while driving.   Please note  You were cared for by a hospitalist during your hospital stay. If you have any questions about your discharge medications or the care you received while you were in the hospital after you are  discharged, you can call the unit and asked to speak with the hospitalist on call if the hospitalist that took care of you is not available. Once you are discharged, your primary care physician will handle any further medical issues. Please note that NO REFILLS for any discharge medications will be authorized once you are discharged, as it is imperative that you return to your primary care physician (or establish a relationship with a primary care physician if you do not have one) for your aftercare needs so that they can reassess your need for medications and monitor your lab values.

## 2018-07-19 NOTE — Discharge Summary (Signed)
[name] QJF:[id number] DOB: 1956/05/31 DOA: 07/16/2018  PCP: [name], MD  Admit date: 07/16/2018  Discharge date: 07/19/2018  Admitted From: Home  Disposition:  Home ( refused SNF)   Recommendations for Outpatient Follow-up:   Follow up with PCP in 1-2 weeks  PCP Please obtain BMP/CBC, 2 view CXR in 1week,  (see Discharge instructions)   PCP Please follow up on the following pending results: Monitor B12 levels and Dilantin levels closely   Home Health: PT, OT, RN, health aide Equipment/Devices: Rolling walker Consultations: Neurology Discharge Condition: Stable CODE STATUS: Full Diet Recommendation: Heart Healthy      Chief Complaint  Patient presents with  . 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 Also she is feeling generally weak and deconditioned. Came to the ER where she was found to have very high Dilantin level. I was requested to admit.  Patient denies any headache, no fever chills, no cough phlegm chest pain or shortness of breath, no abdominal pain no dysuria, no diarrhea, no blood in stool or urine. No focal weakness except chronic mild left-sided weakness.                                            Hospital Course   1.Dilantin toxicity causing breakthroughseizures, ataxia, generalized weakness.   She was treated with supportive care, offending medication Dilantin was held, gradually improving and now at top normal levels.  No further seizures.  Patient surprisingly continues to have weakness mostly in bilateral lower extremities, this has not improved much, continues to have poor balance.  She had multiple falls darting few days prior to admission with lower extremity weakness.  On exam her left-sided slightly more weak than right but states that is chronic.  She says her legs feel heavy constantly and she has some dull ache.    Was seen by neurology and underwent a nonacute total spine MRI, her exam and history were inconsistent, she is also had a recent previous ER visit with vague symptoms.  Neurology thought that her symptoms could be fictitious, when her weakness continued she was offered SNF placement but then she said she feels better and set up in the chair.  She was discharged home with PT she changed her mind again and said now  she would want to be placed to an SNF, I again think her symptoms are very inconsistent, her B12 was low normal hence I have increased her oral B12 supplementation dose, continue PT OT, SNF if she is agreeable.   2. HXof cerebral hemorrhage with mild left-sided chronic weakness. No acute issues. PT in the morning.  3.COPD/asthma. Remote history of smoking. Recommended to continue abstain from smoking she quit several months ago, COPD and asthma are stable, supportive care with oxygen and nebulizer treatments as needed.  4.Generalized weakness and deconditioning. Plan as a #1 above.  5.  History of seizures with a few breakthrough seizures prior to admission due to Dilantin toxicity.    Dilantin dose has been adjusted upon neurology's recommendation to 160 twice daily with close outpatient PCP and  neurology follow-up and once a week Dilantin level check for the next 4 weeks.  Other home seizure medications will be continued changed.  6. B12 deficiency - on PO supplement, her B12 level was low normal, dose has been increased.     Discharge diagnosis     Active Problems:   Skull fracture (HCC)   Dilantin toxicity   Seizure disorder (HCC)   COPD (chronic obstructive pulmonary disease) (HCC)   Seizures (HCC)   Left-sided weakness    Discharge instructions    Discharge Instructions    Diet - low sodium heart healthy   Complete by:  As directed    Discharge instructions   Complete by:  As directed    Follow with Primary MD [name], MD in 5 days   Get CBC, CMP, B12, Dilantin levels-  checked  by Primary MD in 5  days   Activity: As tolerated with Full fall precautions use walker/cane & assistance as needed  Disposition Home    Diet: Heart Healthy    Special Instructions: If you have smoked or chewed Tobacco  in the last 2 yrs please stop smoking, stop any regular Alcohol  and or any Recreational drug use.  On your next visit with your primary care physician please Get Medicines reviewed and adjusted.  Please request your Prim.MD to go over all Hospital Tests and Procedure/Radiological results at the follow up, please get all Hospital records sent to your Prim MD by signing hospital release before you go home.  If you experience worsening of your admission symptoms, develop shortness of breath, life threatening emergency, suicidal or homicidal thoughts you must seek medical attention immediately by calling 911 or calling your MD immediately  if symptoms less severe.  You Must read complete instructions/literature along with all the possible adverse reactions/side effects for all the Medicines you take and that have been prescribed to you. Take any new Medicines after you have completely understood and accpet all the possible adverse reactions/side effects.   Do not  drive, operate heavy machinery, perform activities at heights, swimming or participation in water activities or provide baby sitting services if your were admitted for syncope or siezures until you have seen by Primary MD or a Neurologist and advised to do so again.  Do not drive when taking Pain medications.  Do not take more than prescribed Pain, Sleep and Anxiety Medications  Wear Seat belts while driving.   Please note  You were cared for by a hospitalist during your hospital stay. If you have any questions about your discharge medications or the care you received while you were in the hospital after you are discharged, you can call the unit and  asked to speak with the hospitalist on call if the hospitalist that took care of you is not available. Once you are discharged, your primary care physician will handle any further medical issues. Please note that NO REFILLS for any discharge medications will be authorized once you are discharged, as it is imperative that you return to your primary care physician (or establish a relationship with a primary care physician if you do not have one) for your aftercare needs so that they can reassess your need for medications and monitor your lab values.   Increase activity slowly   Complete by:  As directed       Discharge Medications   Allergies as of 07/19/2018      Reactions   Zonisamide Other (See Comments)   Numbness and tingling over whole body   Codeine Nausea And Vomiting   Keppra [levetiracetam] Other (See Comments)   Causes seizures   Pregabalin Nausea And Vomiting   Olive Oil Rash      Medication List    TAKE these medications   albuterol 108 (90 Base) MCG/ACT inhaler Commonly known as:  PROVENTIL HFA;VENTOLIN HFA Inhale 2 puffs into the lungs every 6 (six) hours as needed for wheezing or shortness of breath.   B-12 1000 MCG Tabs Take 1,000 mcg by mouth daily. What changed:    medication strength  how much to take     cholecalciferol 25 MCG (1000 UT) tablet Commonly known as:  VITAMIN D3 Take 1,000 Units by mouth 2 (two) times daily.   escitalopram 10 MG tablet Commonly known as:  LEXAPRO Take 1 tablet (10 mg total) by mouth daily. What changed:  when to take this   gabapentin 300 MG capsule Commonly known as:  NEURONTIN Take 1 capsule (300 mg total) by mouth 2 (two) times daily. What changed:    how much to take  when to take this   lacosamide 200 MG Tabs tablet Commonly known as:  VIMPAT Take 1 tablet (200 mg total) by mouth 2 (two) times daily.   phenytoin 100 MG ER capsule Commonly known as:  DILANTIN Take 1 capsule (100 mg total) by mouth 2 (two) times daily. Get Level checked by primary care physician once a week for the next 4 weeks. What changed:    how much to take  how to take this  when to take this  additional instructions  Another medication with the same name was removed. Continue taking this medication, and follow the directions you see here.            Durable Medical Equipment  (From admission, onward)         Start     Ordered   07/19/18 0925  For home use only DME Walker rolling  [facility])  Once    Question:  Patient needs a walker to treat with the following condition  Answer:  Weakness   07/19/18 0925          Follow-up Information    [name], MD. Schedule an appointment as soon as possible for a visit in 5 day(s).   Specialty:  Internal Medicine Why:  and your neurologist in 1 week Contact information: [address] [place] [address] [phone]        [name], MD. Schedule an appointment as soon as possible for a visit in 1 week(s).   Specialty:  Neurology Contact information: [place]  [address] [phone]  Major procedures and Radiology Reports - PLEASE review detailed and final reports thoroughly  -         Ct Head Wo Contrast  Result Date:  07/16/2018 CLINICAL DATA:  Multiple seizures this week. Now with tremors and falling. EXAM: CT HEAD WITHOUT CONTRAST TECHNIQUE: Contiguous axial images were obtained from the base of the skull through the vertex without intravenous contrast. COMPARISON:  05/29/2018 FINDINGS: Brain: Examination demonstrates stable encephalomalacia over the right occipital/posterior temporal region. Remaining ventricles, cisterns and CSF spaces are otherwise unchanged and within normal. There is no mass, mass effect, shift of midline structures or acute hemorrhage. No evidence of acute infarction. Vascular: No hyperdense vessel or unexpected calcification. Skull: Previous right posterior parietal/occipital craniotomy. Sinuses/Orbits: No acute finding. Other: None. IMPRESSION: No acute findings. Previous right posterior parietal/occipital craniotomy. 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 Electronically Signed   By: Marin Olp M.D.   On: 07/16/2018 11:29   Mr Cervical Spine Wo Contrast  Result Date: 07/18/2018 CLINICAL DATA:  62 year old female with history of seizures. Unexplained left-side and lower extremity heaviness. Pain radiating from the right neck to the flank. EXAM: MRI TOTAL SPINE WITHOUT CONTRAST TECHNIQUE: Multisequence MR imaging of the spine from the cervical spine to the sacrum was performed without IV contrast administration. COMPARISON:  Head CT without contrast 07/16/2018. Brain MRI 05/31/2018. Lumbar MRI 03/20/2018. Chest CTA 05/23/2018. Cervical spine CT 05/29/2018. FINDINGS: MRI CERVICAL SPINE FINDINGS Alignment: Stable cervical lordosis compared to the October CT. Vertebrae: No marrow edema or evidence of acute osseous abnormality. Degenerative endplate marrow signal changes at C5-C6 and C6-C7. Cord: Spinal cord signal is within normal limits at all visualized levels. Posterior Fossa, vertebral arteries, paraspinal tissues: Cervicomedullary junction is within  normal limits. Large area of posterior right hemisphere encephalomalacia redemonstrated. Preserved major vascular flow voids in the neck. Negative neck soft tissues and visible lung apices. Disc levels: C2-C3:  Mild disc bulge.  Mild facet hypertrophy.  No stenosis. C3-C4: Mild disc bulge. Mild to moderate facet hypertrophy greater on the left. Mild to moderate ligament flavum hypertrophy. No spinal stenosis. Mild left C4 foraminal stenosis. C4-C5: Subtle anterolisthesis with moderate to severe left and mild-to-moderate right facet hypertrophy. Mild to moderate ligament flavum hypertrophy. Mild disc bulge and endplate spurring primarily affecting the left foramen. No significant spinal stenosis. Moderate to severe left and mild right C5 foraminal stenosis. C5-C6: Disc space loss with right eccentric circumferential disc osteophyte complex. Mild facet and ligament flavum hypertrophy. Spinal stenosis. No definite spinal cord mass effect. Mild to moderate left and severe right C6 foraminal stenosis. C6-C7: Disc space loss with circumferential disc osteophyte complex. Mild facet and ligament flavum hypertrophy. Mild spinal stenosis, no cord mass effect. Moderate bilateral C7 foraminal stenosis. C7-T1: Small right paracentral disc protrusion. Mild facet and ligament flavum hypertrophy. No stenosis. MRI THORACIC SPINE FINDINGS Segmentation:  Normal on the comparison CT. Alignment: Stable mildly exaggerated thoracic kyphosis since October. No spondylolisthesis. Vertebrae: Mild chronic endplate changes in the lower thoracic spine, most pronounced at T12. No vertebral marrow edema. Benign T7 vertebral body hemangioma. However, there is confluent marrow edema in the posterior right 8th, 9th, and 10th ribs (series 25 image 4, series 26 image 3, series 28 images 21 through 27). No rib fracture here was evident in October. Marrow signal in the other posterior ribs appears normal. Cord: No convincing thoracic spinal cord signal  spinal canal abnormality. Capacious spinal canal. The conus medullaris appears normal at T12-L1. Paraspinal and other soft  tissues: Small or trace layering pleural effusions. Otherwise negative visible thoracic and upper abdominal viscera Disc levels: No thoracic spinal stenosis. Small right paracentral disc protrusion or endplate osteophyte at G3-T5. Mild thoracic posterior element hypertrophy, no thoracic neural foraminal stenosis. MRI LUMBAR SPINE FINDINGS Segmentation: Normal, concordant with the other spinal numbering and the same numbering system used on the August MRI. Alignment: Stable lumbar lordosis since August. Subtle retrolisthesis of L3 on L4 and L4 on L5 is stable. Vertebrae: No marrow edema or evidence of acute osseous abnormality. Stable marrow signal. Intact visible sacrum and SI joints. Conus medullaris: Extends to the L1 level. No lower spinal cord or conus signal abnormality. Cauda equina nerve roots appear stable and normal. Paraspinal and other soft tissues: Stable and negative visible abdominal viscera. Negative visualized posterior paraspinal soft tissues. Disc levels: Unchanged since August. Borderline to mild multifactorial spinal stenosis at L2-L3 and L3-L4 related to circumferential disc bulge and posterior element hypertrophy. IMPRESSION: 1. Confluent marrow edema in the posterior right 8th, 9th, and 10th ribs. The contiguous pattern of involvement suggests sequelae of trauma. No rib fractures were evident on the 05/23/2018 Chest CTA. Perhaps these are posttraumatic bone contusions. There are trace layering pleural effusions. 2. No other acute osseous abnormality identified in the cervical, thoracic, or lumbar spine. 3. No convincing cervical or thoracic spinal cord abnormality. 4. Mild degenerative cervical spinal stenosis at C5-C6 and C6-C7 with moderate or severe bilateral foraminal stenosis. No thoracic spinal or foraminal stenosis. Stable borderline to mild lumbar spinal stenosis  since the lumbar MRI on 03/20/2018. Electronically Signed   By: Genevie Ann M.D.   On: 07/18/2018 14:26   Mr Thoracic Spine Wo Contrast  Result Date: 07/18/2018 CLINICAL DATA:  62 year old female with history of seizures. Unexplained left-side and lower extremity heaviness. Pain radiating from the right neck to the flank. EXAM: MRI TOTAL SPINE WITHOUT CONTRAST TECHNIQUE: Multisequence MR imaging of the spine from the cervical spine to the sacrum was performed without IV contrast administration. COMPARISON:  Head CT without contrast 07/16/2018. Brain MRI 05/31/2018. Lumbar MRI 03/20/2018. Chest CTA 05/23/2018. Cervical spine CT 05/29/2018. FINDINGS: MRI CERVICAL SPINE FINDINGS Alignment: Stable cervical lordosis compared to the October CT. Vertebrae: No marrow edema or evidence of acute osseous abnormality. Degenerative endplate marrow signal changes at C5-C6 and C6-C7. Cord: Spinal cord signal is within normal limits at all visualized levels. Posterior Fossa, vertebral arteries, paraspinal tissues: Cervicomedullary junction is within normal limits. Large area of posterior right hemisphere encephalomalacia redemonstrated. Preserved major vascular flow voids in the neck. Negative neck soft tissues and visible lung apices. Disc levels: C2-C3:  Mild disc bulge.  Mild facet hypertrophy.  No stenosis. C3-C4: Mild disc bulge. Mild to moderate facet hypertrophy greater on the left. Mild to moderate ligament flavum hypertrophy. No spinal stenosis. Mild left C4 foraminal stenosis. C4-C5: Subtle anterolisthesis with moderate to severe left and mild-to-moderate right facet hypertrophy. Mild to moderate ligament flavum hypertrophy. Mild disc bulge and endplate spurring primarily affecting the left foramen. No significant spinal stenosis. Moderate to severe left and mild right C5 foraminal stenosis. C5-C6: Disc space loss with right eccentric circumferential disc osteophyte complex. Mild facet and ligament flavum hypertrophy.  Spinal stenosis. No definite spinal cord mass effect. Mild to moderate left and severe right C6 foraminal stenosis. C6-C7: Disc space loss with circumferential disc osteophyte complex. Mild facet and ligament flavum hypertrophy. Mild spinal stenosis, no cord mass effect. Moderate bilateral C7 foraminal stenosis. C7-T1: Small right paracentral disc protrusion. Mild  facet and ligament flavum hypertrophy. No stenosis. MRI THORACIC SPINE FINDINGS Segmentation:  Normal on the comparison CT. Alignment: Stable mildly exaggerated thoracic kyphosis since October. No spondylolisthesis. Vertebrae: Mild chronic endplate changes in the lower thoracic spine, most pronounced at T12. No vertebral marrow edema. Benign T7 vertebral body hemangioma. However, there is confluent marrow edema in the posterior right 8th, 9th, and 10th ribs (series 25 image 4, series 26 image 3, series 28 images 21 through 27). No rib fracture here was evident in October. Marrow signal in the other posterior ribs appears normal. Cord: No convincing thoracic spinal cord signal spinal canal abnormality. Capacious spinal canal. The conus medullaris appears normal at T12-L1. Paraspinal and other soft tissues: Small or trace layering pleural effusions. Otherwise negative visible thoracic and upper abdominal viscera Disc levels: No thoracic spinal stenosis. Small right paracentral disc protrusion or endplate osteophyte at Y1-P5. Mild thoracic posterior element hypertrophy, no thoracic neural foraminal stenosis. MRI LUMBAR SPINE FINDINGS Segmentation: Normal, concordant with the other spinal numbering and the same numbering system used on the August MRI. Alignment: Stable lumbar lordosis since August. Subtle retrolisthesis of L3 on L4 and L4 on L5 is stable. Vertebrae: No marrow edema or evidence of acute osseous abnormality. Stable marrow signal. Intact visible sacrum and SI joints. Conus medullaris: Extends to the L1 level. No lower spinal cord or conus signal  abnormality. Cauda equina nerve roots appear stable and normal. Paraspinal and other soft tissues: Stable and negative visible abdominal viscera. Negative visualized posterior paraspinal soft tissues. Disc levels: Unchanged since August. Borderline to mild multifactorial spinal stenosis at L2-L3 and L3-L4 related to circumferential disc bulge and posterior element hypertrophy. IMPRESSION: 1. Confluent marrow edema in the posterior right 8th, 9th, and 10th ribs. The contiguous pattern of involvement suggests sequelae of trauma. No rib fractures were evident on the 05/23/2018 Chest CTA. Perhaps these are posttraumatic bone contusions. There are trace layering pleural effusions. 2. No other acute osseous abnormality identified in the cervical, thoracic, or lumbar spine. 3. No convincing cervical or thoracic spinal cord abnormality. 4. Mild degenerative cervical spinal stenosis at C5-C6 and C6-C7 with moderate or severe bilateral foraminal stenosis. No thoracic spinal or foraminal stenosis. Stable borderline to mild lumbar spinal stenosis since the lumbar MRI on 03/20/2018. Electronically Signed   By: Genevie Ann M.D.   On: 07/18/2018 14:26   Mr [name]  Result Date: 07/18/2018 CLINICAL DATA:  62 year old female with history of seizures. Unexplained left-side and lower extremity heaviness. Pain radiating from the right neck to the flank. EXAM: MRI TOTAL SPINE WITHOUT CONTRAST TECHNIQUE: Multisequence MR imaging of the spine from the cervical spine to the sacrum was performed without IV contrast administration. COMPARISON:  Head CT without contrast 07/16/2018. Brain MRI 05/31/2018. Lumbar MRI 03/20/2018. Chest CTA 05/23/2018. Cervical spine CT 05/29/2018. FINDINGS: MRI CERVICAL SPINE FINDINGS Alignment: Stable cervical lordosis compared to the October CT. Vertebrae: No marrow edema or evidence of acute osseous abnormality. Degenerative endplate marrow signal changes at C5-C6 and C6-C7. Cord: Spinal cord  signal is within normal limits at all visualized levels. Posterior Fossa, vertebral arteries, paraspinal tissues: Cervicomedullary junction is within normal limits. Large area of posterior right hemisphere encephalomalacia redemonstrated. Preserved major vascular flow voids in the neck. Negative neck soft tissues and visible lung apices. Disc levels: C2-C3:  Mild disc bulge.  Mild facet hypertrophy.  No stenosis. C3-C4: Mild disc bulge. Mild to moderate facet hypertrophy greater on the left. Mild to moderate ligament flavum hypertrophy.  No spinal stenosis. Mild left C4 foraminal stenosis. C4-C5: Subtle anterolisthesis with moderate to severe left and mild-to-moderate right facet hypertrophy. Mild to moderate ligament flavum hypertrophy. Mild disc bulge and endplate spurring primarily affecting the left foramen. No significant spinal stenosis. Moderate to severe left and mild right C5 foraminal stenosis. C5-C6: Disc space loss with right eccentric circumferential disc osteophyte complex. Mild facet and ligament flavum hypertrophy. Spinal stenosis. No definite spinal cord mass effect. Mild to moderate left and severe right C6 foraminal stenosis. C6-C7: Disc space loss with circumferential disc osteophyte complex. Mild facet and ligament flavum hypertrophy. Mild spinal stenosis, no cord mass effect. Moderate bilateral C7 foraminal stenosis. C7-T1: Small right paracentral disc protrusion. Mild facet and ligament flavum hypertrophy. No stenosis. MRI THORACIC SPINE FINDINGS Segmentation:  Normal on the comparison CT. Alignment: Stable mildly exaggerated thoracic kyphosis since October. No spondylolisthesis. Vertebrae: Mild chronic endplate changes in the lower thoracic spine, most pronounced at T12. No vertebral marrow edema. Benign T7 vertebral body hemangioma. However, there is confluent marrow edema in the posterior right 8th, 9th, and 10th ribs (series 25 image 4, series 26 image 3, series 28 images 21 through 27). No  rib fracture here was evident in October. Marrow signal in the other posterior ribs appears normal. Cord: No convincing thoracic spinal cord signal spinal canal abnormality. Capacious spinal canal. The conus medullaris appears normal at T12-L1. Paraspinal and other soft tissues: Small or trace layering pleural effusions. Otherwise negative visible thoracic and upper abdominal viscera Disc levels: No thoracic spinal stenosis. Small right paracentral disc protrusion or endplate osteophyte at H0-W2. Mild thoracic posterior element hypertrophy, no thoracic neural foraminal stenosis. MRI LUMBAR SPINE FINDINGS Segmentation: Normal, concordant with the other spinal numbering and the same numbering system used on the August MRI. Alignment: Stable lumbar lordosis since August. Subtle retrolisthesis of L3 on L4 and L4 on L5 is stable. Vertebrae: No marrow edema or evidence of acute osseous abnormality. Stable marrow signal. Intact visible sacrum and SI joints. Conus medullaris: Extends to the L1 level. No lower spinal cord or conus signal abnormality. Cauda equina nerve roots appear stable and normal. Paraspinal and other soft tissues: Stable and negative visible abdominal viscera. Negative visualized posterior paraspinal soft tissues. Disc levels: Unchanged since August. Borderline to mild multifactorial spinal stenosis at L2-L3 and L3-L4 related to circumferential disc bulge and posterior element hypertrophy. IMPRESSION: 1. Confluent marrow edema in the posterior right 8th, 9th, and 10th ribs. The contiguous pattern of involvement suggests sequelae of trauma. No rib fractures were evident on the 05/23/2018 Chest CTA. Perhaps these are posttraumatic bone contusions. There are trace layering pleural effusions. 2. No other acute osseous abnormality identified in the cervical, thoracic, or lumbar spine. 3. No convincing cervical or thoracic spinal cord abnormality. 4. Mild degenerative cervical spinal stenosis at C5-C6 and  C6-C7 with moderate or severe bilateral foraminal stenosis. No thoracic spinal or foraminal stenosis. Stable borderline to mild lumbar spinal stenosis since the lumbar MRI on 03/20/2018. Electronically Signed   By: Genevie Ann M.D.   On: 07/18/2018 14:26    Micro Results     No results found for this or any previous visit (from the past 240 hour(s)).  Today   Subjective    [name] today has no headache,no chest abdominal pain,no new weakness tingling or numbness, feels much better wants to go home today.     Objective   Blood pressure 105/60, pulse (!) 56, temperature 98.2 F (36.8 C), temperature source Oral,  resp. rate 17, SpO2 97 %.   Intake/Output Summary (Last 24 hours) at 07/19/2018 0926 Last data filed at 07/19/2018 0515 Gross per 24 hour  Intake 1418.75 ml  Output 1550 ml  Net -131.25 ml    Exam  Awake Alert, Oriented x 3, No new F.N deficits, anxious affect [place].AT,PERRAL Supple Neck,No JVD, No cervical lymphadenopathy appriciated.  Symmetrical Chest wall movement, Good air movement bilaterally, CTAB RRR,No Gallops,Rubs or new Murmurs, No Parasternal Heave +ve B.Sounds, Abd Soft, Non tender, No organomegaly appriciated, No rebound -guarding or rigidity. No Cyanosis, Clubbing or edema, No new Rash or bruise   Data Review   CBC w Diff:  Lab Results  Component Value Date   WBC 3.3 (L) 07/18/2018   HGB 12.3 07/18/2018   HCT 39.4 07/18/2018   HCT 38.5 02/17/2017   PLT 143 (L) 07/18/2018   LYMPHOPCT 39 07/16/2018   MONOPCT 6 07/16/2018   EOSPCT 1 07/16/2018   BASOPCT 0 07/16/2018    CMP:  Lab Results  Component Value Date   NA 143 07/18/2018   NA 145 (H) 06/10/2013   K 3.8 07/18/2018   CL 108 07/18/2018   CO2 25 07/18/2018   BUN 9 07/18/2018   BUN 13 06/10/2013   CREATININE 0.57 07/18/2018   PROT 5.0 (L) 07/17/2018   PROT 6.1 06/10/2013   ALBUMIN 2.8 (L) 07/17/2018   ALBUMIN 4.5 06/10/2013   BILITOT 0.3 07/17/2018   ALKPHOS 49 07/17/2018   AST  12 (L) 07/17/2018   ALT 10 07/17/2018  .   Total Time in preparing paper work, data evaluation and todays exam - 25 minutes  Lala Lund M.D on 07/19/2018 at 9:26 AM  [facility]   Office  [phone]

## 2018-07-19 NOTE — Care Management Note (Addendum)
Case Management Note  Patient Details  Name: [name] MRN: [id number] Date of Birth: [birth year]  Subjective/Objective:     Dilantin toxicity   Resides with mother in law/ family. Multi admits. Recently admitted with seizure activity, 10/26-10/29 , d/c with [facility] providing home health services.    [name] (Daughter) Maeci Kalbfleisch (Son) Darnelle Spangle (Sister)  [phone]        [phone]          Action/Plan: Transition to home with home health services to follow.Declined SNF placement. Son Merrily Pew states family to ensure mom has 24/7 supervision once d/c.  Son to provide transportation to home. Expected Discharge Date:  07/19/18               Expected Discharge Plan:  [place]  In-House Referral:      Discharge planning Services  CM Consult  Post Acute Care Choice:    Choice offered to:  Patient  DME Arranged:  N/A DME Agency:  NA  HH Arranged:  PT, OT, Nurse's Aide [place] Agency:  [facility]  Status of Service:  Completed, signed off  If discussed at [place] of Stay Meetings, dates discussed:    Additional Comments:  Sharin Mons, RN 07/19/2018, 4:20 PM

## 2018-07-19 NOTE — Progress Notes (Signed)
Inpatient Rehabilitation-Admissions Coordinator   [facility] followed up with pt after PM&R consult. Pt made aware she is not a candidate for CIR.   Per DC summary in chart, pt is to be discharged home as she refused SNF placement. Pt and her son are aware of the post acute rehab recommendation but believe she will do better at home with family. They did express concern regarding medications, follow up, and back pain. CM in room who alerted RN to concerns.   AC will sign off.   Please call if questions.   Jhonnie Garner, OTR/L  Rehab Admissions Coordinator  [phone] 07/19/2018 5:42 PM

## 2018-07-19 NOTE — Progress Notes (Signed)
Discharge instructions given to patient and son at bedside. Expressed understanding about dosage changes and Home health. Son spoke with Doctor over the phone to clarify concerns.  IVs removed without complication. Returned home medications to patient. Patient walked down with Son who is transportation home.

## 2018-07-20 ENCOUNTER — Telehealth: Payer: Self-pay | Admitting: Neurology

## 2018-07-20 NOTE — Telephone Encounter (Signed)
Called number listed as pt's daughter.  It was a business number with an outgoing message that the office is closed 07/16/18 thru 08/06/2018.  Called number listed as pt's and LMOM asking for return call to relay message about labs and appt.

## 2018-07-20 NOTE — Telephone Encounter (Signed)
Pls call her daughter and ask if she has been giving patient medications or is patient taking her own meds? Re-check Dilantin level in AM (before AM dose). F/U on 12/30

## 2018-07-20 NOTE — Telephone Encounter (Signed)
Per pt's hospital AVS, pt to follow up with [name] in 1 week (07/26/18)  [name] - the only spot available is 08/02/18 @1pm .  OK to schedule here?

## 2018-07-20 NOTE — Telephone Encounter (Signed)
Patient states that she was told to have Dr Delice Lesch check her Dilantin levels please call if this is some thing that Dr Delice Lesch will do so patient knows what she needs to do

## 2018-07-21 ENCOUNTER — Ambulatory Visit: Payer: Self-pay

## 2018-07-21 ENCOUNTER — Other Ambulatory Visit: Payer: Self-pay

## 2018-07-21 IMAGING — DX DG THORACIC SPINE 2V
4 series · 4 of 4 positions shown · non-contrast
Comparison: Two-view chest 06/21/2015

CLINICAL DATA: Fell 2 days ago. Pain mid thoracic spine region
radiating throughout the lumbar spine. Numbness in both legs.

EXAM:
THORACIC SPINE 2 VIEWS

[t-spine ap]
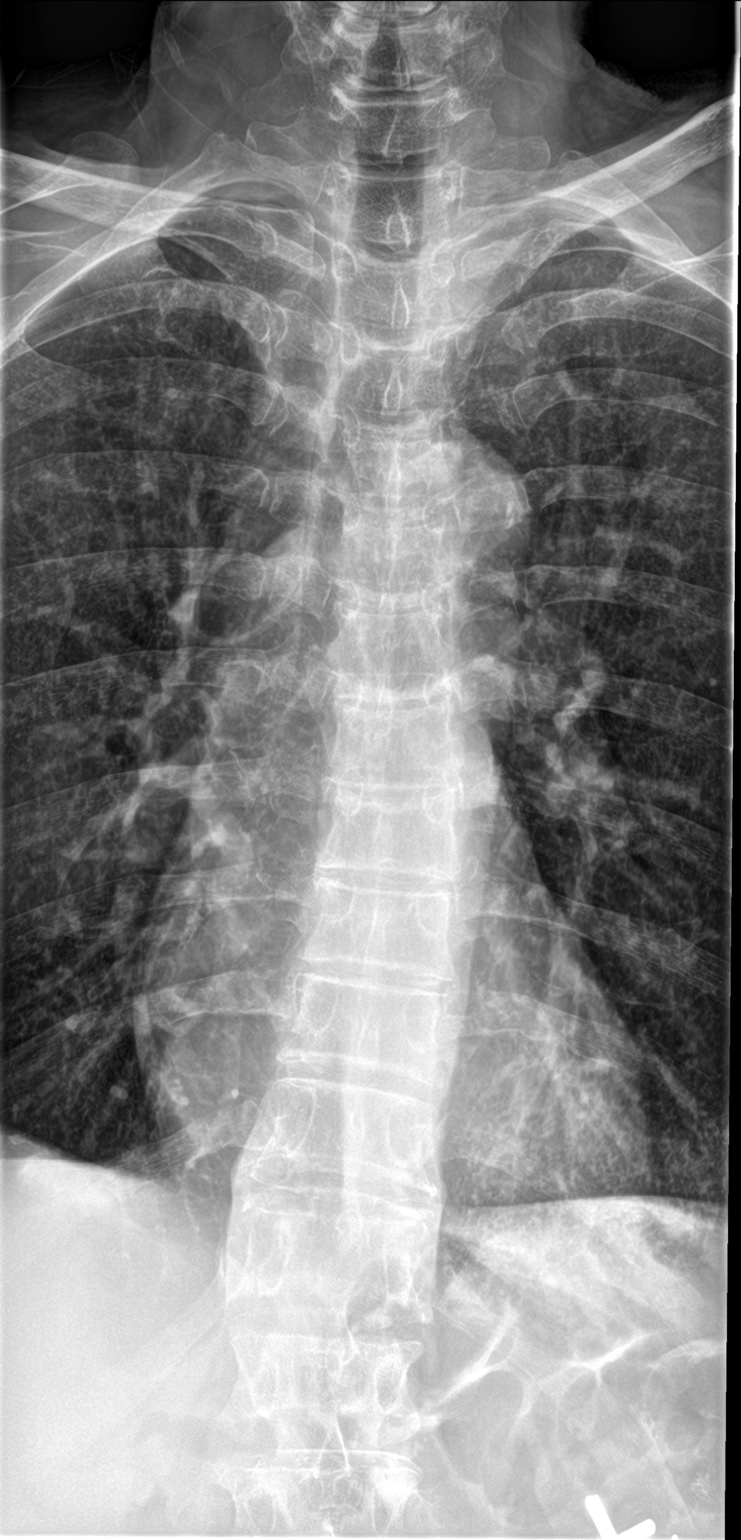

[t-spine lat (1 of 2)]
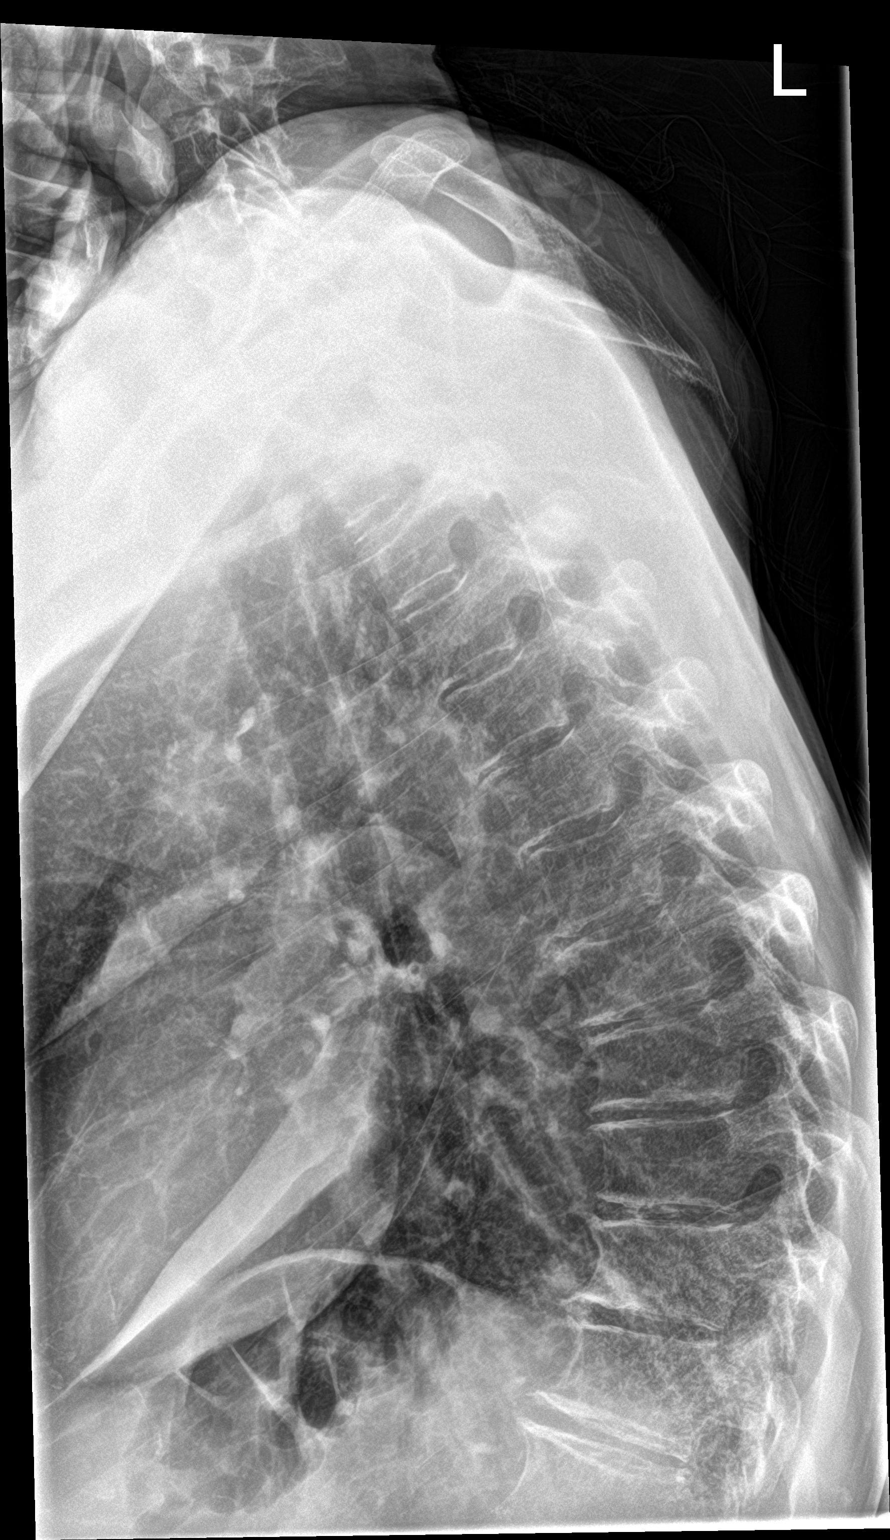

[t-spine swimmers]
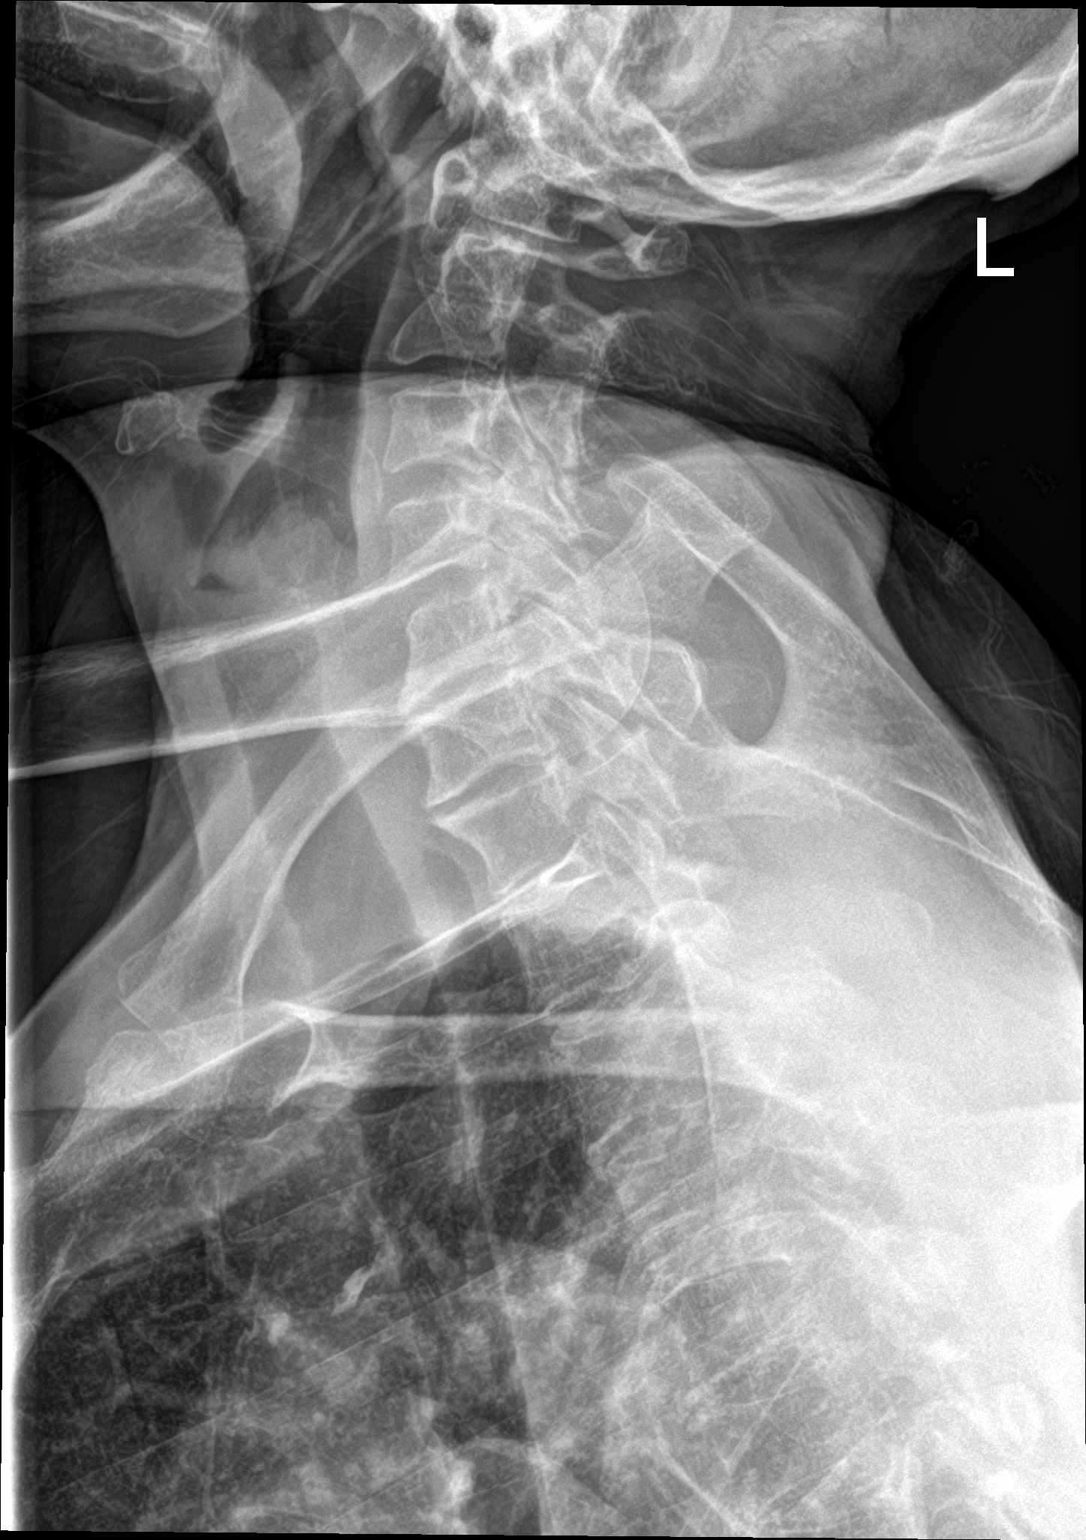

[t-spine lat (2 of 2)]
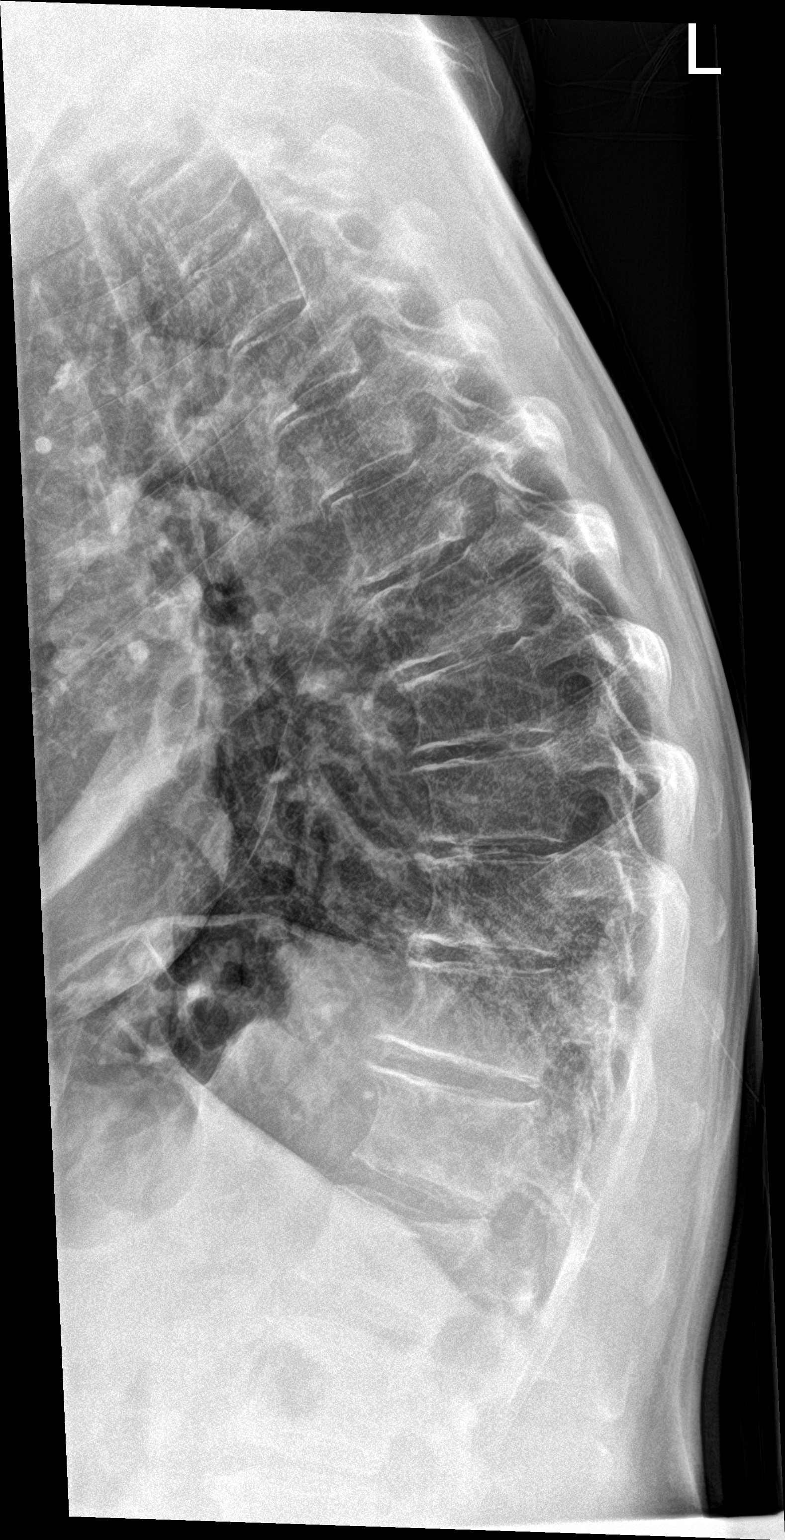

[4 of 4 positions shown; findings below may reference images not displayed]

FINDINGS: Diffuse degenerative change throughout the thoracic spine with mild
lower thoracic kyphosis. Mild anterior wedge deformities of mid and
lower thoracic vertebrae, likely degenerative. Appearance is
unchanged since previous study. No anterior subluxation of the
thoracic vertebrae. Bone cortex appears intact. No acute compression
is suggested. No paraspinal soft tissue swelling. Incidental note of
aortic atherosclerosis.
IMPRESSION: Degenerative changes in the thoracic spine with mild lower thoracic
kyphosis unchanged since prior studies. No acute displaced fractures
identified.

## 2018-07-21 IMAGING — MR MR THORACIC SPINE W/O CM
4 of 12 series · 18 of 48 positions shown · non-contrast
Comparison: 01/19/2015 lumbar radiographs.

CLINICAL DATA: 60 y/o F; status post fall 2 nights ago with back
pain and tingling to the bottoms of the feet.

EXAM:
MRI THORACIC AND LUMBAR SPINE WITHOUT CONTRAST
TECHNIQUE: Multiplanar and multiecho pulse sequences of the thoracic and lumbar
spine were obtained without intravenous contrast.

[Series 8: T2 · sagittal · 3.0mm · 0.62mm/px · 3 of 15 slices shown (1 of 4)]
[im 1/15]
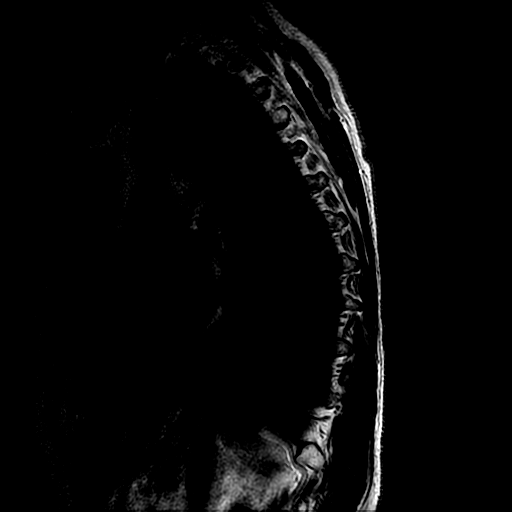
[im 8/15]
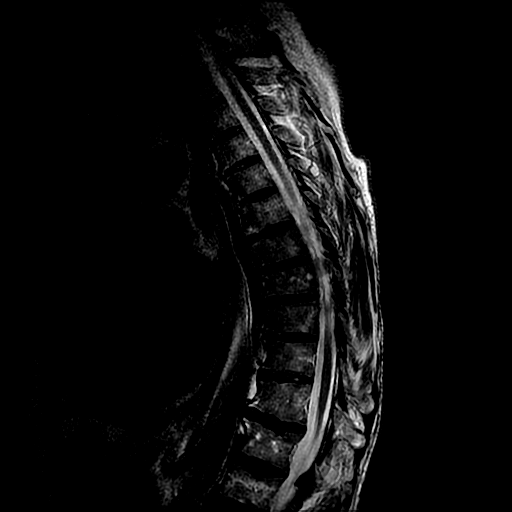
[im 15/15]
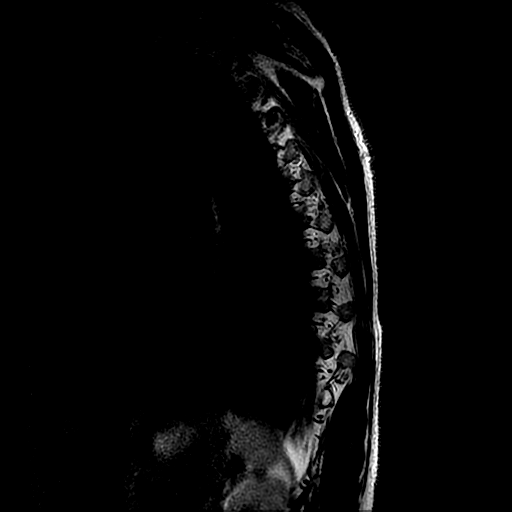

[Series 13: T2 · axial · 4.0mm · 0.43mm/px · z∈[-282,-95]mm · 7 of 36 slices shown (2 of 4)]
[im 1/36]
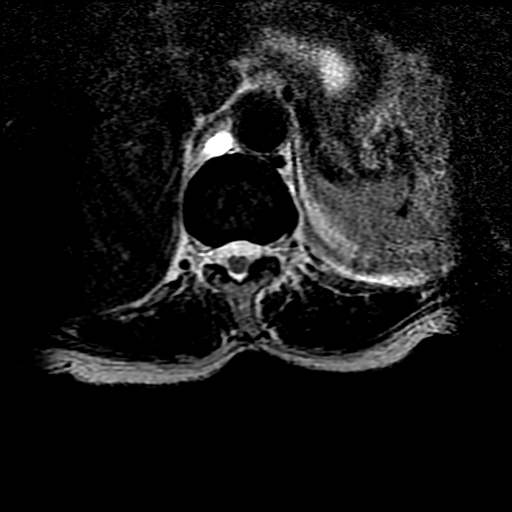
[im 6/36]
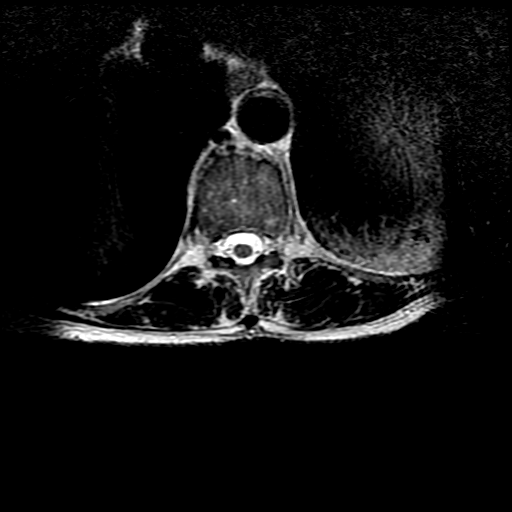
[im 12/36]
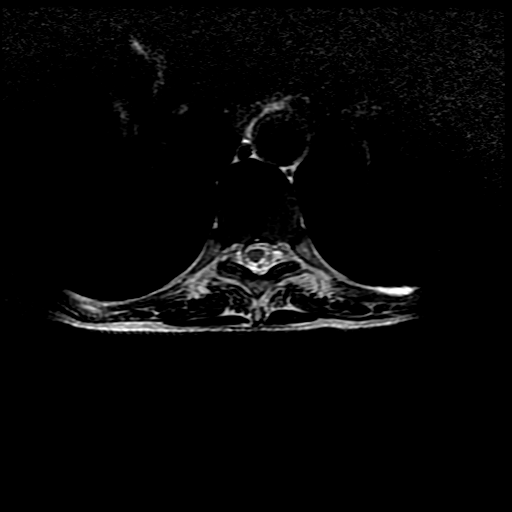
[im 18/36]
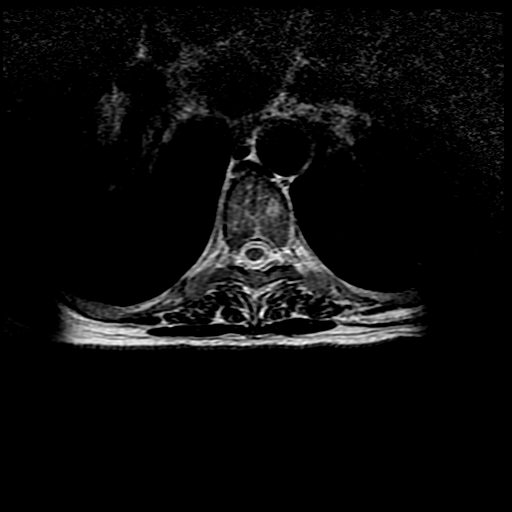
[im 24/36]
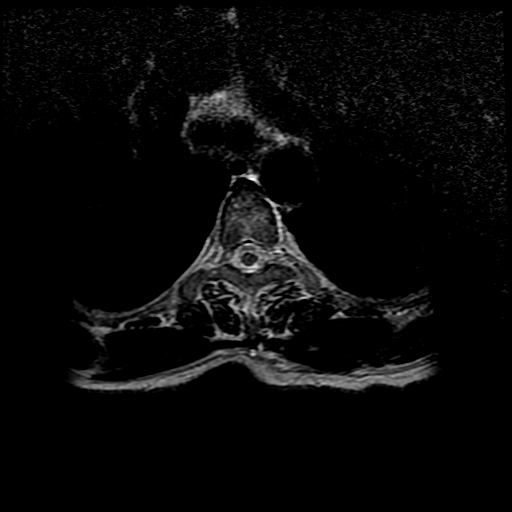
[im 30/36]
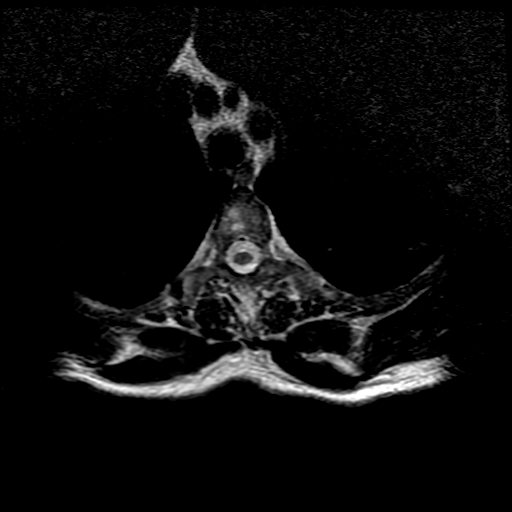
[im 36/36]
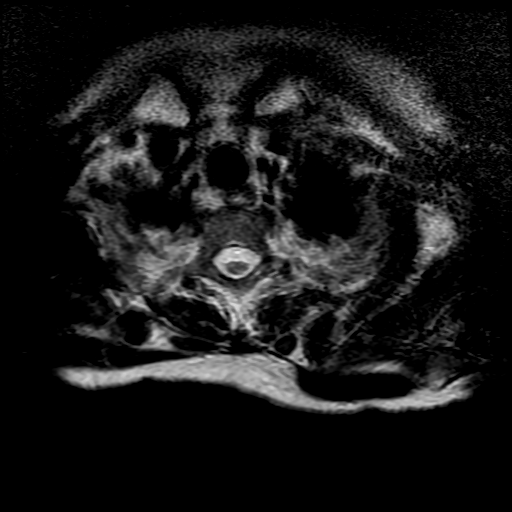

[Series 18: T2 · sagittal · 4.0mm · 0.55mm/px · 3 of 15 slices shown (3 of 4)]
[im 1/15]
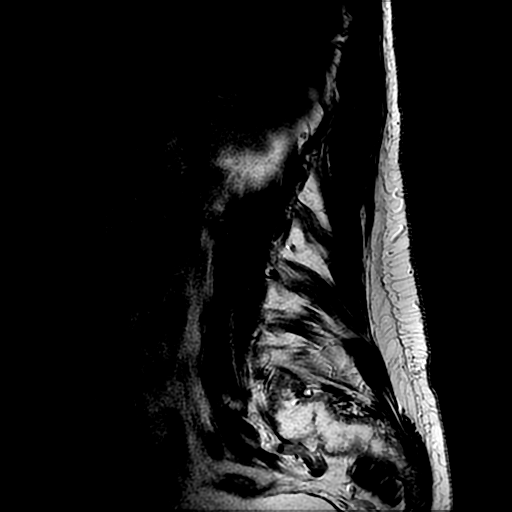
[im 8/15]
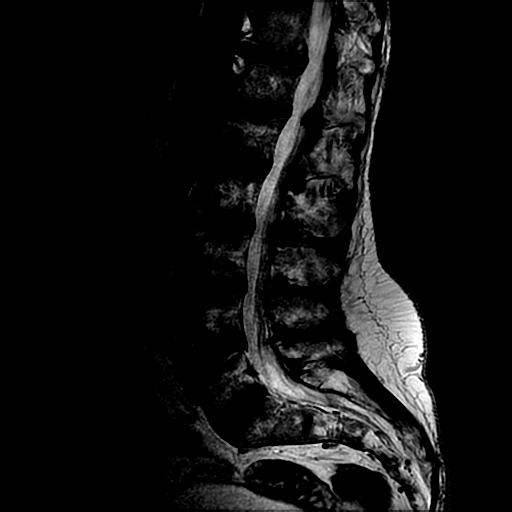
[im 15/15]
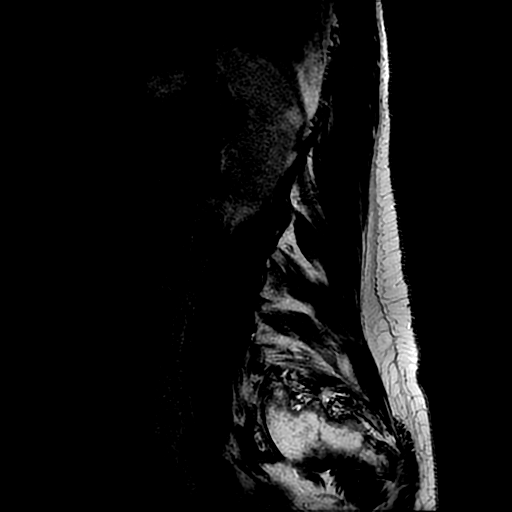

[Series 21: T2 · axial · 4.0mm · 0.39mm/px · z∈[-498,-315]mm · 5 of 32 slices shown (4 of 4)]
[im 1/32]
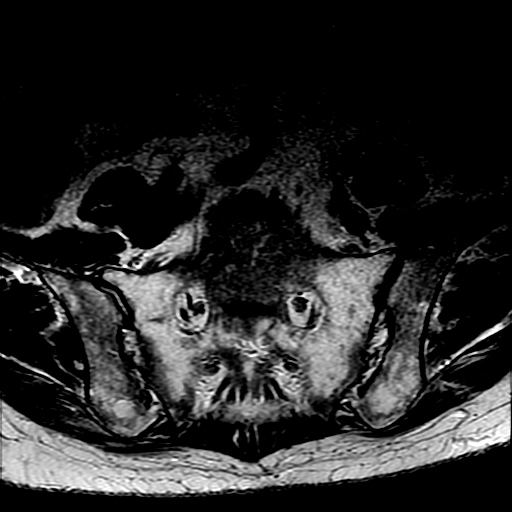
[im 7/32]
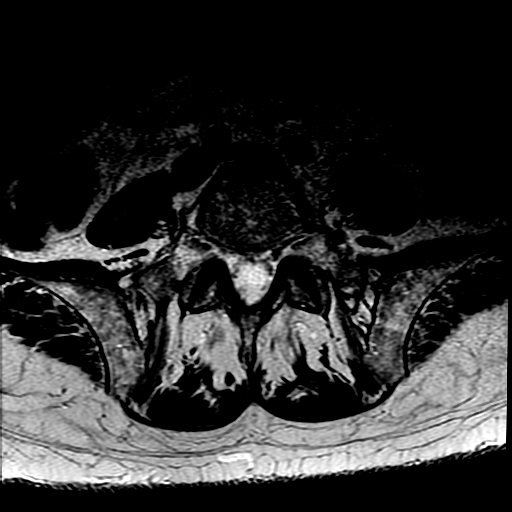
[im 13/32]
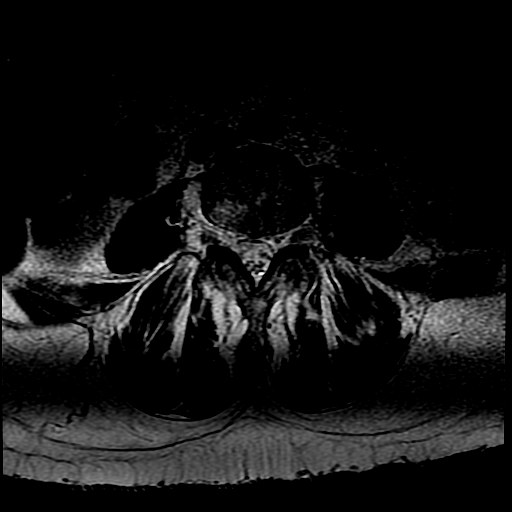
[im 19/32]
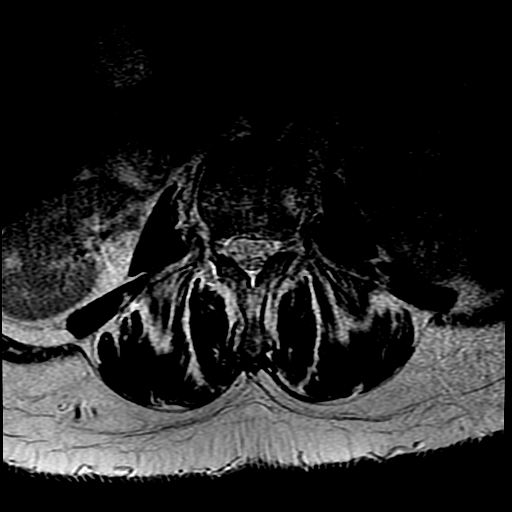
[im 32/32]
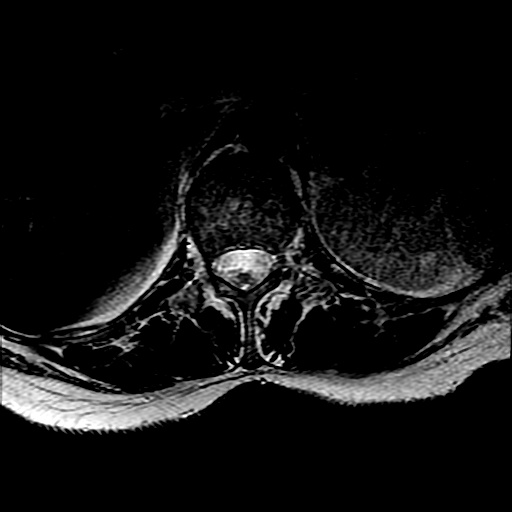

[18 of 48 positions shown; findings below may reference images not displayed]

FINDINGS: MRI THORACIC SPINE FINDINGS

Alignment: Mild thoracic levocurvature with apex at T9. Thoracic
kyphosis is maintained. No listhesis.

Vertebrae: T12 superior endplate Schmorl's node. T2 hyperintense
foci are present in the T3, T7 and T10 vertebral bodies. No evidence
for discitis or fracture. Mild anterior loss of height of the T11
and T12 vertebral bodies is stable from prior radiographs.

Cord:  No abnormal cord signal.

Paraspinal and other soft tissues: Negative.

Disc levels:

There are multiple levels of disc desiccation with disc space
narrowing and anterior marginal osteophytes. There is no significant
disc displacement, foraminal narrowing, or canal stenosis of the
thoracic spine.

MRI LUMBAR SPINE FINDINGS

Segmentation:  Standard.

Alignment:  Normal lumbar lordosis.  No listhesis.

Vertebrae: Mild endplate degenerative edema at the L2-3 and L4-5
intervertebral discs levels. Small L3 inferior endplate Schmorl's
node.

Mild anterior compression deformity of the superior endplate of L1
vertebral body with edema likely representing acute fracture. No
retropulsion of posterior elements.

Conus medullaris: Extends to the L1-2 level and appears normal.

Paraspinal and other soft tissues: Negative.

Disc levels:

L1-2: No significant disc displacement, foraminal narrowing, or
canal stenosis.

L2-3: Small disc bulge and mild facet and ligamentum flavum
hypertrophy. Mild foraminal and lateral recess narrowing. No
significant canal stenosis.

L3-4: Small disc bulge and mild facet and ligamentum flavum
hypertrophy. Mild foraminal and lateral recess narrowing. No
significant canal stenosis.

L4-5: Small disc bulge and mild facet and ligamentum flavum
hypertrophy. Mild foraminal and lateral recess narrowing. No
significant canal stenosis.

L5-S1: Minimal disc bulge and mild facet hypertrophy without
significant foraminal narrowing or canal stenosis.
IMPRESSION: MR THORACIC SPINE IMPRESSION

1. No evidence for discitis, fracture, or neural impingement.
2. Nonspecific T2 hyperintense foci within the T3, T7 and T10
vertebral bodies. In the absence of primary malignancy or myeloma
these probably represent fat poor hemangioma.
MR LUMBAR SPINE IMPRESSION

1. Mild anterior compression deformity at the superior L1 endplate
with bone marrow edema likely representing acute/subacute injury.
Loss of vertebral height is new from [DATE].
2. Mild degenerative changes of lumbar spine with mild foraminal
narrowing at the L2 through L5 levels. No high-grade canal stenosis,
foraminal narrowing, or neural impingement.
These results were called by telephone at the time of interpretation
on 06/10/2016 at [DATE] to Dr. FERIENHAUS ERXLEBEN , who verbally
acknowledged these results.

By: Jailene Sieber M.D.

## 2018-07-21 IMAGING — DX DG LUMBAR SPINE 2-3V
3 series · 3 of 3 positions shown · non-contrast
Comparison: 01/19/2015

CLINICAL DATA: Fell 2 days ago. Pain mid thoracic spine region
radiating throughout the lumbar spine. Numbness in the legs.

EXAM:
LUMBAR SPINE - 2-3 VIEW

[l-spine ap]
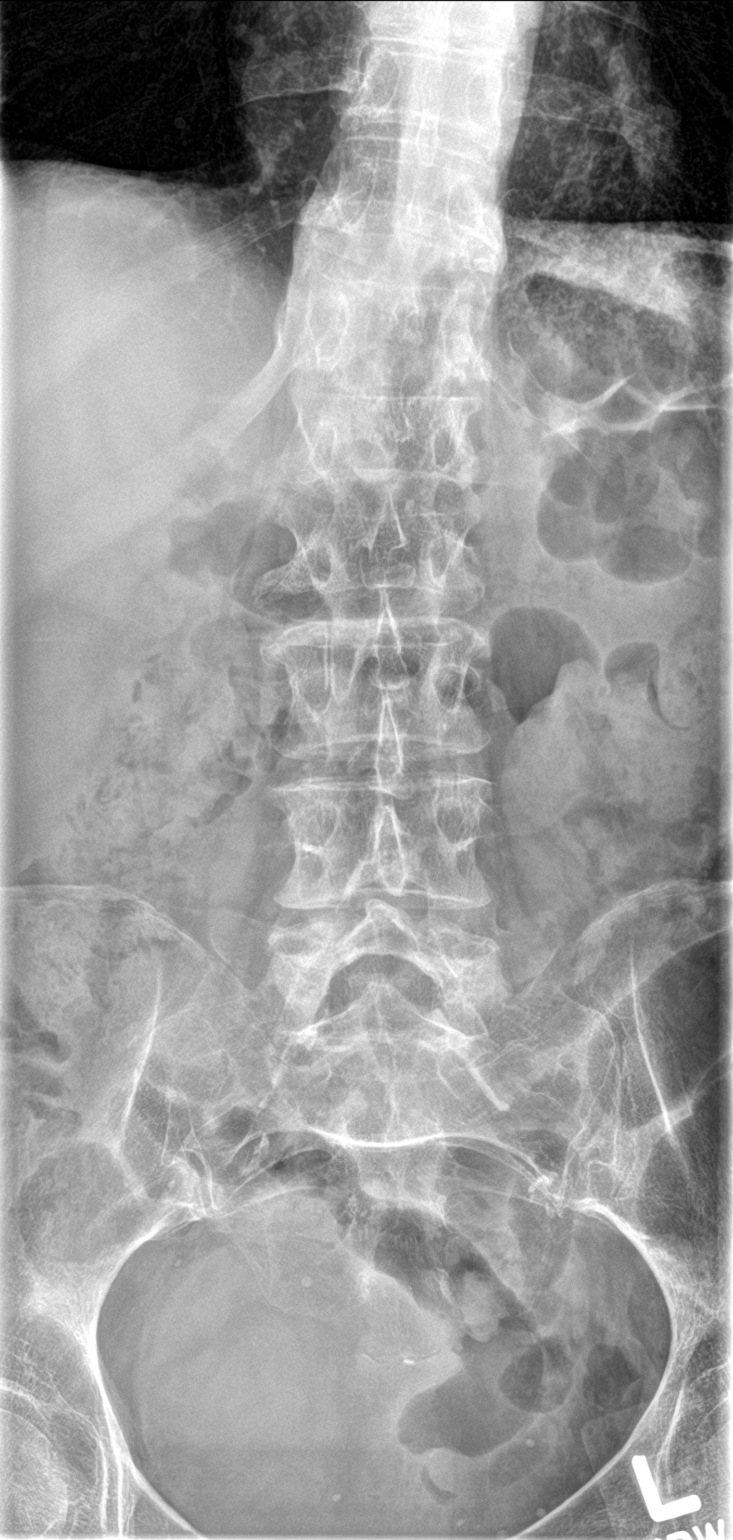

[l-spine lat]
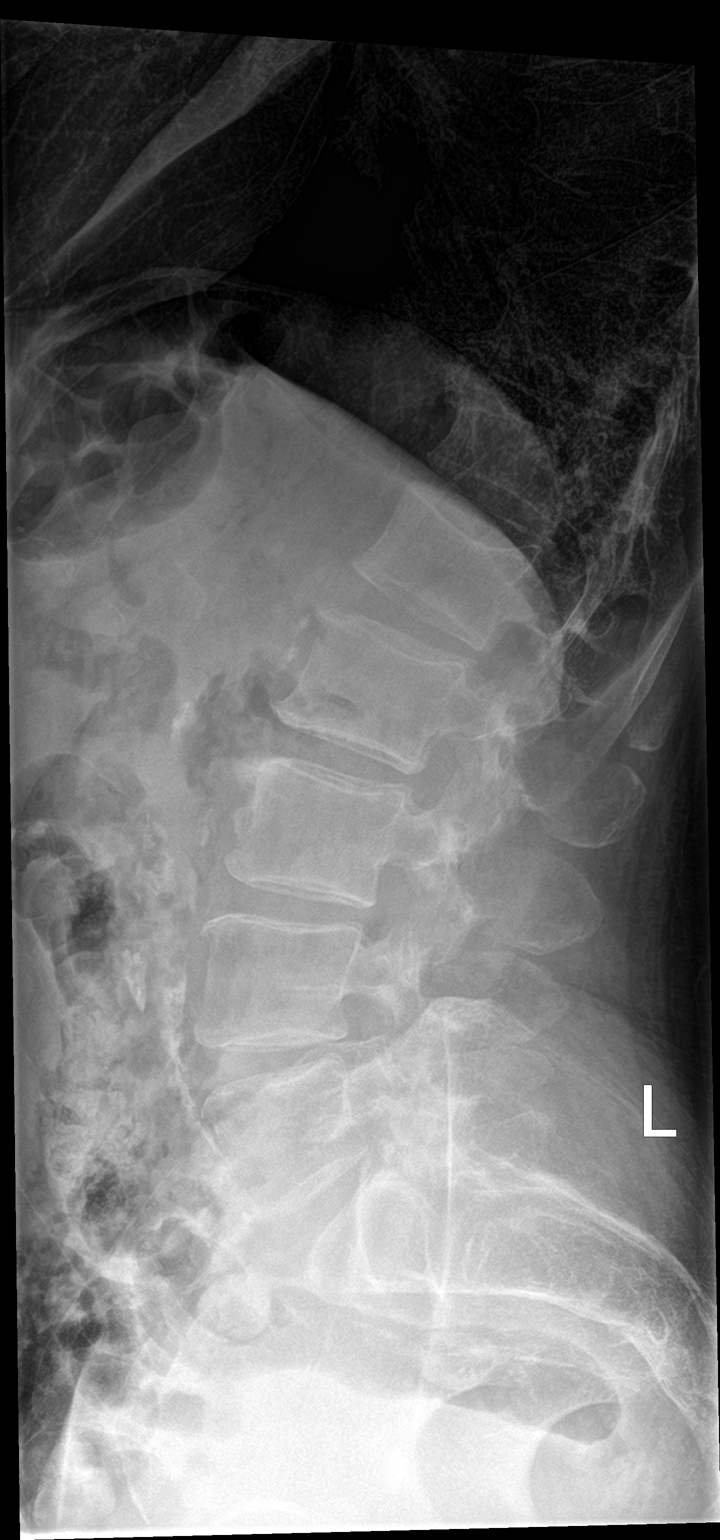

[l-spine spot]
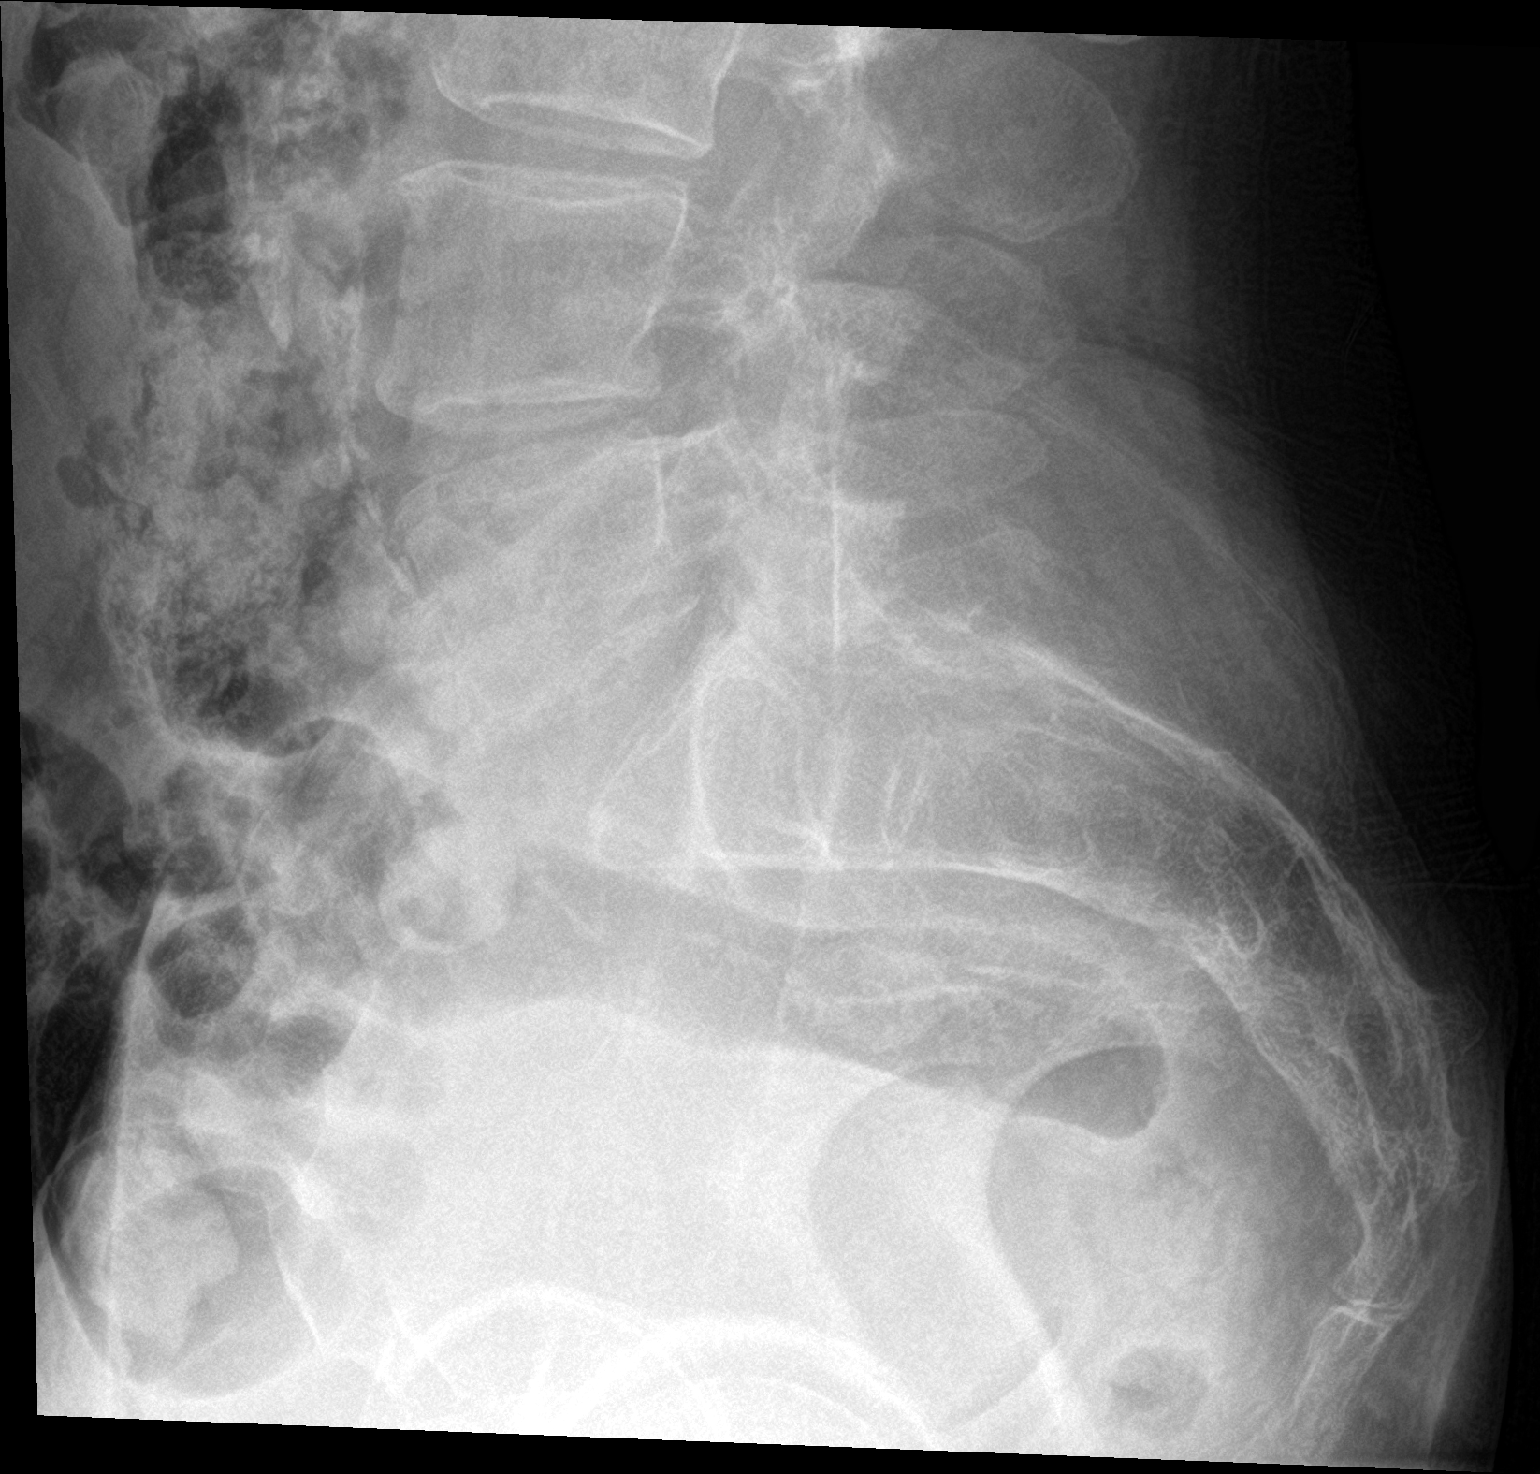

[3 of 3 positions shown; findings below may reference images not displayed]

FINDINGS: Normal alignment of the lumbar spine. Mild degenerative changes with
endplate hypertrophic changes throughout. Mild disc space narrowing.
Degenerative changes in the facet joints. No acute vertebral
compression deformities. Bone cortex appears intact. Vascular
calcifications in the aorta.
IMPRESSION: Degenerative changes in the lumbar spine. No acute displaced
fractures identified.

## 2018-07-21 NOTE — Patient Outreach (Signed)
[facility]) Care Management  07/21/2018  [name] [birth year] [phone]   Second unsuccessful outreach attempt to ensure receipt of resources mailed on 06/29/18.  BSW will attempt to reach again within four business days.   [name], BSW Social Worker [phone]

## 2018-07-22 ENCOUNTER — Other Ambulatory Visit: Payer: Self-pay

## 2018-07-22 DIAGNOSIS — M4802 Spinal stenosis, cervical region: Secondary | ICD-10-CM | POA: Diagnosis not present

## 2018-07-22 DIAGNOSIS — H53462 Homonymous bilateral field defects, left side: Secondary | ICD-10-CM | POA: Diagnosis not present

## 2018-07-22 DIAGNOSIS — G40509 Epileptic seizures related to external causes, not intractable, without status epilepticus: Secondary | ICD-10-CM | POA: Diagnosis not present

## 2018-07-22 DIAGNOSIS — M48061 Spinal stenosis, lumbar region without neurogenic claudication: Secondary | ICD-10-CM | POA: Diagnosis not present

## 2018-07-22 DIAGNOSIS — G40909 Epilepsy, unspecified, not intractable, without status epilepticus: Secondary | ICD-10-CM | POA: Diagnosis not present

## 2018-07-22 DIAGNOSIS — T420X1D Poisoning by hydantoin derivatives, accidental (unintentional), subsequent encounter: Secondary | ICD-10-CM | POA: Diagnosis not present

## 2018-07-22 DIAGNOSIS — I69354 Hemiplegia and hemiparesis following cerebral infarction affecting left non-dominant side: Secondary | ICD-10-CM | POA: Diagnosis not present

## 2018-07-22 DIAGNOSIS — J449 Chronic obstructive pulmonary disease, unspecified: Secondary | ICD-10-CM | POA: Diagnosis not present

## 2018-07-22 DIAGNOSIS — I69198 Other sequelae of nontraumatic intracerebral hemorrhage: Secondary | ICD-10-CM | POA: Diagnosis not present

## 2018-07-22 NOTE — Patient Outreach (Signed)
[facility]) Care Management  07/22/2018  [name] 1955-10-28 [id number]   BSW received return call from [name].  [name] confirmed receipt of resources that were mailed.  She was able to get assistance from a charity to purchase a seizure alert device.  BSW is closing case at this time due to no additional social work needs being identified.    [name], BSW Social Worker [phone]

## 2018-07-23 ENCOUNTER — Telehealth: Payer: Self-pay | Admitting: Neurology

## 2018-07-23 NOTE — Telephone Encounter (Signed)
[name] called from [facility] regarding this patient and her recently being discharged from the hospital with her having Dilantin Toxicity. She is needing to know should she have her Dilantin levels checked as well as needing to let [name] know of Med changes in the hospital. Please Call [phone]. Thanks

## 2018-07-26 ENCOUNTER — Ambulatory Visit: Payer: Self-pay

## 2018-07-26 DIAGNOSIS — M4802 Spinal stenosis, cervical region: Secondary | ICD-10-CM | POA: Diagnosis not present

## 2018-07-26 DIAGNOSIS — G40509 Epileptic seizures related to external causes, not intractable, without status epilepticus: Secondary | ICD-10-CM | POA: Diagnosis not present

## 2018-07-26 DIAGNOSIS — I69198 Other sequelae of nontraumatic intracerebral hemorrhage: Secondary | ICD-10-CM | POA: Diagnosis not present

## 2018-07-26 DIAGNOSIS — G40909 Epilepsy, unspecified, not intractable, without status epilepticus: Secondary | ICD-10-CM | POA: Diagnosis not present

## 2018-07-26 DIAGNOSIS — M48061 Spinal stenosis, lumbar region without neurogenic claudication: Secondary | ICD-10-CM | POA: Diagnosis not present

## 2018-07-26 DIAGNOSIS — T420X1D Poisoning by hydantoin derivatives, accidental (unintentional), subsequent encounter: Secondary | ICD-10-CM | POA: Diagnosis not present

## 2018-07-26 DIAGNOSIS — H53462 Homonymous bilateral field defects, left side: Secondary | ICD-10-CM | POA: Diagnosis not present

## 2018-07-26 DIAGNOSIS — I69354 Hemiplegia and hemiparesis following cerebral infarction affecting left non-dominant side: Secondary | ICD-10-CM | POA: Diagnosis not present

## 2018-07-26 DIAGNOSIS — J449 Chronic obstructive pulmonary disease, unspecified: Secondary | ICD-10-CM | POA: Diagnosis not present

## 2018-07-26 NOTE — Telephone Encounter (Signed)
Spoke with pt.  She states that she will not be able to make it to appointment on 07/29/18.  States that the doctor she saw at the hospital told her that he would order weekly Dilantin level check to be done at pt's home.  I advised that that order was never placed and that her hospital AVS states to follow up with [name] within 1 week of discharge.  Pt states that [name] told her on November 4 that she wanted to see her back in the office in 3 months.  I advised that that was prior to pt being admitted to hospital for Dilantin toxicity. Advised that per protocol when pt admitted for toxicity pt is to follow up with prescribing physician.  Pt did not seem to understand this.  Appointment canceled.

## 2018-07-26 NOTE — Telephone Encounter (Signed)
Spoke with Estill Bamberg with [facility].  12/30 slot already filled, but opening became available on 07/29/18 @ 10AM.  Appointment scheduled.  I advised that pt needs to have dilantin levels checked prior to her visit.  Relayed to [place] to instruct pt to NOT take Dilantin that AM and to come to office early for blood draw.  Estill Bamberg states that she will relay message to pt.

## 2018-07-29 ENCOUNTER — Ambulatory Visit: Payer: Self-pay | Admitting: Neurology

## 2018-08-03 DIAGNOSIS — G40909 Epilepsy, unspecified, not intractable, without status epilepticus: Secondary | ICD-10-CM | POA: Diagnosis not present

## 2018-08-03 DIAGNOSIS — M4802 Spinal stenosis, cervical region: Secondary | ICD-10-CM | POA: Diagnosis not present

## 2018-08-03 DIAGNOSIS — M48061 Spinal stenosis, lumbar region without neurogenic claudication: Secondary | ICD-10-CM | POA: Diagnosis not present

## 2018-08-03 DIAGNOSIS — J449 Chronic obstructive pulmonary disease, unspecified: Secondary | ICD-10-CM | POA: Diagnosis not present

## 2018-08-03 DIAGNOSIS — I69198 Other sequelae of nontraumatic intracerebral hemorrhage: Secondary | ICD-10-CM | POA: Diagnosis not present

## 2018-08-03 DIAGNOSIS — H53462 Homonymous bilateral field defects, left side: Secondary | ICD-10-CM | POA: Diagnosis not present

## 2018-08-03 DIAGNOSIS — I69354 Hemiplegia and hemiparesis following cerebral infarction affecting left non-dominant side: Secondary | ICD-10-CM | POA: Diagnosis not present

## 2018-08-03 DIAGNOSIS — T420X1D Poisoning by hydantoin derivatives, accidental (unintentional), subsequent encounter: Secondary | ICD-10-CM | POA: Diagnosis not present

## 2018-08-03 DIAGNOSIS — G40509 Epileptic seizures related to external causes, not intractable, without status epilepticus: Secondary | ICD-10-CM | POA: Diagnosis not present

## 2018-08-06 ENCOUNTER — Telehealth: Payer: Self-pay

## 2018-08-06 DIAGNOSIS — G40909 Epilepsy, unspecified, not intractable, without status epilepticus: Secondary | ICD-10-CM | POA: Diagnosis not present

## 2018-08-06 DIAGNOSIS — R269 Unspecified abnormalities of gait and mobility: Secondary | ICD-10-CM

## 2018-08-06 DIAGNOSIS — G629 Polyneuropathy, unspecified: Secondary | ICD-10-CM

## 2018-08-06 DIAGNOSIS — G40509 Epileptic seizures related to external causes, not intractable, without status epilepticus: Secondary | ICD-10-CM | POA: Diagnosis not present

## 2018-08-06 DIAGNOSIS — H53462 Homonymous bilateral field defects, left side: Secondary | ICD-10-CM | POA: Diagnosis not present

## 2018-08-06 DIAGNOSIS — I69198 Other sequelae of nontraumatic intracerebral hemorrhage: Secondary | ICD-10-CM | POA: Diagnosis not present

## 2018-08-06 DIAGNOSIS — R531 Weakness: Secondary | ICD-10-CM

## 2018-08-06 DIAGNOSIS — T420X1D Poisoning by hydantoin derivatives, accidental (unintentional), subsequent encounter: Secondary | ICD-10-CM

## 2018-08-06 DIAGNOSIS — M48061 Spinal stenosis, lumbar region without neurogenic claudication: Secondary | ICD-10-CM | POA: Diagnosis not present

## 2018-08-06 DIAGNOSIS — I69354 Hemiplegia and hemiparesis following cerebral infarction affecting left non-dominant side: Secondary | ICD-10-CM | POA: Diagnosis not present

## 2018-08-06 DIAGNOSIS — J449 Chronic obstructive pulmonary disease, unspecified: Secondary | ICD-10-CM | POA: Diagnosis not present

## 2018-08-06 DIAGNOSIS — M4802 Spinal stenosis, cervical region: Secondary | ICD-10-CM | POA: Diagnosis not present

## 2018-08-06 NOTE — Telephone Encounter (Signed)
Ok to send order for vestibular PT. Pls call her daughter and send a letter to her home address with lab slip for Dilantin check. Thanks

## 2018-08-06 NOTE — Telephone Encounter (Signed)
Received call from Herbert Deaner, Physical Therapist with [facility].  He states that pt is no longer home-bound and therefore being discharged.  Jim recommends outpatient vestibular rehab with [facility] for balance.  States that pt told him that she had left several messages with our office to set up Dilantin level check.  I let Clair Gulling know that our office has not received any message from pt.  In fact, pt refused bloodwork on 07/23/18 stating that doctor from the hospital was to set up in home lab draw (This is not noted in pt chart from hospital visit) and we have not heard from her since.    [name] - OK to send order for PT?

## 2018-08-06 NOTE — Addendum Note (Signed)
Addended by: Lenny Pastel on: 08/06/2018 01:29 PM   Modules accepted: Orders

## 2018-08-06 NOTE — Telephone Encounter (Signed)
Orders and referral placed in system.  Letter written and placed at front desk for [employer] pick up.

## 2018-08-09 ENCOUNTER — Telehealth: Payer: Self-pay | Admitting: Neurology

## 2018-08-09 NOTE — Telephone Encounter (Signed)
Lab Slip order and letter with instructions were mailed to pt via [employer] on 08/06/2018

## 2018-08-09 NOTE — Telephone Encounter (Signed)
Patient called and lmom needing her Dilantin  Levels checked 1 x a week? Please Call. Thanks

## 2018-08-17 DIAGNOSIS — G40909 Epilepsy, unspecified, not intractable, without status epilepticus: Secondary | ICD-10-CM | POA: Diagnosis not present

## 2018-08-17 DIAGNOSIS — J9811 Atelectasis: Secondary | ICD-10-CM | POA: Diagnosis not present

## 2018-08-17 DIAGNOSIS — Z09 Encounter for follow-up examination after completed treatment for conditions other than malignant neoplasm: Secondary | ICD-10-CM | POA: Diagnosis not present

## 2018-08-17 DIAGNOSIS — Z0001 Encounter for general adult medical examination with abnormal findings: Secondary | ICD-10-CM | POA: Diagnosis not present

## 2018-08-17 DIAGNOSIS — J449 Chronic obstructive pulmonary disease, unspecified: Secondary | ICD-10-CM | POA: Diagnosis not present

## 2018-08-19 ENCOUNTER — Telehealth: Payer: Self-pay

## 2018-08-19 DIAGNOSIS — G40909 Epilepsy, unspecified, not intractable, without status epilepticus: Secondary | ICD-10-CM

## 2018-08-19 DIAGNOSIS — T420X1D Poisoning by hydantoin derivatives, accidental (unintentional), subsequent encounter: Secondary | ICD-10-CM

## 2018-08-19 NOTE — Telephone Encounter (Signed)
Lab order placed in system.  Printed and placed at front desk for [facility] pick up

## 2018-08-19 NOTE — Telephone Encounter (Signed)
Received VM from pt's daughter stating that she had spoken with pt and pt is actually taking 160mg  BID, not 160mg  AM and 200mg  PM.  Asks if pt should lower Dilantin some more? pls advise

## 2018-08-19 NOTE — Telephone Encounter (Signed)
Received lab report from [facility] - pt's recent Dilantin levels at 25.5 (ref 10-20)  Called and spoke with pt's daughter, [name], asking how pt is taking Dilantin.  [name] states that pt taking 160mg  AM and 200mg PM - and that was told to [name] at pt's LOV.  Looking in pt's chart, Dilantin was lowered to 160mg  BID by Dr. Candiss Norse when pt was admitted to hospital for Dilantin Toxicity.  Asked why not taking that way, to which [name] replied "he doesn't know her.  Why would we change how she takes her medication?"  I advised that I would speak with Dr Delice Lesch and return call with her recommendations  Verbal per [name] - reduce Dilantin to 160mg  BID.

## 2018-08-19 NOTE — Telephone Encounter (Signed)
Spoke with [name], relaying Dilantin reduction.  Advised that I would send lab order via [facility] and requested thatpt have repeat Dilantin level check in 7-10 days

## 2018-08-20 NOTE — Telephone Encounter (Signed)
Pls confirm with [name] that when recent Dilantin level was drawn, patient was taking 160mg  BID? Would re-check level one more time and have [name] make sure level is drawn first thing in the morning before she takes morning dose. We will adjust dose based on that. Thanks

## 2018-08-26 ENCOUNTER — Telehealth: Payer: Self-pay | Admitting: Neurology

## 2018-08-26 NOTE — Telephone Encounter (Signed)
[name], please advise if this is something that we can order through [facility]?

## 2018-08-26 NOTE — Telephone Encounter (Signed)
Patient called an would like to know can she have her Dilantin levels checked through [facility] since she does not drive? Please Call. Thanks

## 2018-08-27 DIAGNOSIS — J449 Chronic obstructive pulmonary disease, unspecified: Secondary | ICD-10-CM | POA: Diagnosis not present

## 2018-08-27 DIAGNOSIS — J9811 Atelectasis: Secondary | ICD-10-CM | POA: Diagnosis not present

## 2018-08-27 DIAGNOSIS — Z0000 Encounter for general adult medical examination without abnormal findings: Secondary | ICD-10-CM | POA: Diagnosis not present

## 2018-08-27 DIAGNOSIS — Z720 Tobacco use: Secondary | ICD-10-CM | POA: Diagnosis not present

## 2018-08-27 DIAGNOSIS — G40909 Epilepsy, unspecified, not intractable, without status epilepticus: Secondary | ICD-10-CM | POA: Diagnosis not present

## 2018-08-27 DIAGNOSIS — I629 Nontraumatic intracranial hemorrhage, unspecified: Secondary | ICD-10-CM | POA: Diagnosis not present

## 2018-08-27 NOTE — Telephone Encounter (Signed)
If [facility] does Dilantin levels, that would be great. Would like it done before she takes her next dose, it sounds like she takes it at 1am and 1pm? If that is the case, if they can check around noon. Thanks

## 2018-08-31 ENCOUNTER — Telehealth: Payer: Self-pay | Admitting: Neurology

## 2018-08-31 NOTE — Telephone Encounter (Signed)
Repeat Dilantin level on Dilantin 160mg  BID done 08/30/2018 was 18.9. Patient has a f/u on 09/07/2018.

## 2018-09-01 DIAGNOSIS — Z1211 Encounter for screening for malignant neoplasm of colon: Secondary | ICD-10-CM | POA: Diagnosis not present

## 2018-09-01 DIAGNOSIS — E440 Moderate protein-calorie malnutrition: Secondary | ICD-10-CM | POA: Diagnosis not present

## 2018-09-01 DIAGNOSIS — G40909 Epilepsy, unspecified, not intractable, without status epilepticus: Secondary | ICD-10-CM | POA: Diagnosis not present

## 2018-09-01 DIAGNOSIS — Z01419 Encounter for gynecological examination (general) (routine) without abnormal findings: Secondary | ICD-10-CM | POA: Diagnosis not present

## 2018-09-01 DIAGNOSIS — I629 Nontraumatic intracranial hemorrhage, unspecified: Secondary | ICD-10-CM | POA: Diagnosis not present

## 2018-09-01 DIAGNOSIS — B373 Candidiasis of vulva and vagina: Secondary | ICD-10-CM | POA: Diagnosis not present

## 2018-09-01 DIAGNOSIS — R87610 Atypical squamous cells of undetermined significance on cytologic smear of cervix (ASC-US): Secondary | ICD-10-CM | POA: Diagnosis not present

## 2018-09-01 DIAGNOSIS — Z1151 Encounter for screening for human papillomavirus (HPV): Secondary | ICD-10-CM | POA: Diagnosis not present

## 2018-09-07 ENCOUNTER — Encounter

## 2018-09-07 ENCOUNTER — Other Ambulatory Visit: Payer: Self-pay

## 2018-09-07 ENCOUNTER — Ambulatory Visit: Payer: Medicare HMO | Admitting: Neurology

## 2018-09-07 ENCOUNTER — Encounter: Payer: Self-pay | Admitting: Neurology

## 2018-09-07 VITALS — BP 100/58 | HR 78 | Ht 67.25 in | Wt 132.0 lb

## 2018-09-07 DIAGNOSIS — G243 Spasmodic torticollis: Secondary | ICD-10-CM

## 2018-09-07 DIAGNOSIS — G40219 Localization-related (focal) (partial) symptomatic epilepsy and epileptic syndromes with complex partial seizures, intractable, without status epilepticus: Secondary | ICD-10-CM | POA: Diagnosis not present

## 2018-09-07 MED ORDER — PHENYTOIN SODIUM EXTENDED 100 MG PO CAPS: ORAL_CAPSULE | ORAL | 3 refills | Status: DC

## 2018-09-07 MED ORDER — GABAPENTIN 300 MG PO CAPS: ORAL_CAPSULE | ORAL | 3 refills | Status: DC

## 2018-09-07 MED ORDER — PHENYTOIN SODIUM EXTENDED 30 MG PO CAPS: ORAL_CAPSULE | ORAL | 3 refills | Status: DC

## 2018-09-07 MED ORDER — LACOSAMIDE 200 MG PO TABS: 200.0000 mg | ORAL_TABLET | Freq: Two times a day (BID) | ORAL | 3 refills | Status: DC

## 2018-09-07 NOTE — Progress Notes (Signed)
NEUROLOGY FOLLOW UP OFFICE NOTE  [name] [id number] [birth year]  HISTORY OF PRESENT ILLNESS: I had the pleasure of seeing [name] in follow-up in the neurology clinic on 09/07/2018.  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 She is alone in the office today. Records and images were personally reviewed where available. Since her last visit, she was admitted again (third time) for dilantin toxicity. On her last visit in November 2019, she was taking 160mg  in AM, 200mg  in PM which has been her dose for many years. A trough level done on 06/09/18 was 12.8. She called our office on 07/13/18 regarding a seizure, then apparently had seizures daily so family brought her to the ER on 07/16/18 where Dilantin level was again elevated at 38.2. Dose was held and level was trending down. She was instructed to reduce Dilantin dose to 160mg  BID, repeat level on 08/18/18 was 25.5. She was instructed to further reduce dose to 160mg  in AM, 130mg  in PM. Level on 08/30/2018 was 18.9. She states she has been taking this dose, in addition to Vimpat 200mg  BID and Gabapentin 300mg  BID. She reports one seizure after her hospitalization in December, and one in her sleep in January where her daughter in the next room heard bumping sounds, and woke up feeling sore.   She continues to report balance issues, where it feels like her left foot turns in. She also has back pain and left leg pain/numbness/tingling. 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 On her last visit, she was also reporting more cognitive issues, repeat EEG done 06/2018 showed right posterior temporal slowing, occasional epileptiform discharges over the right posterior temporal region, breach artifact in this region. 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 She had been seeing epileptologist [name], records were reviewed and will be summarized as follows. In the past, seizures would start with headache and burning sensation of the left arm, which may be followed by left arm jerking. Over time, she has not had any prior warning symptoms. She has had seizures where she is noted to have kicking and rocking movements of the pelvis with unresponsiveness. She was in the EMU in 10/2012 where 11 seizures were captured from the right posterior hemisphere/right inferior temporal region. Interictal EEG showed abundant right posterior temporal epileptiform discharges, frequent right anterior temporal epileptiform discharges, as well as occasional independent left anterior temporal epileptiform discharges. There was focal slowing over the right posterior hemisphere. MRI brain in 2014 showed right parietal/occipital encephalomalacia, right hippocampus smaller than left. PET scan in 2015 showed multiple areas of hypometabolism on the right, mostly corresponding to or adjacent to the structural abnormality. Ictal SPECT showed right hemisphere hypoperfusion. SISCOM showed right frontal region significant on the subtraction. MEG in 07/2013 showed spikes localizing to the right occipital, parietal, mesial temporal regions with a weak signal left mesial parietal. Concomitant EEG showed right posterior spikes. Functional mapping suggestive of left language lateralization. She has tried multiple AEDs in the past with various side effects or ineffective, and has been taking Dilantin 160mg  in AM, 200mg  in PM and Vimpat 200mg  BID with seizures  around once a month. She was admitted to [facility] on 03/19/18 for left leg weakness and drowsiness. She was fishing with her  boyfriend when she suddenly could not move her left leg and almost fell. She states that her boyfriend told her that she was shaking and had a seizure while they were fishing. She was brought to [facility] where Dilantin level was 33.8. She was adamant that she has been taking her medications as instructed and did not take more than prescribed, no new medications taken. She had an MRI brain without contrast which I personally reviewed, no acute changes seen. Large volume right parieto-occipital encephalomalacia was unchanged. Right hippocampus is smaller. She continued to report weakness and had an MRI cervical and lumbar spine which did not show any myelopathy, there was prominent left-sided facet hypertrophy at C4-5 with resultant moderate left C5 foraminal stenosis, degenerative disc osteophyte at C5-6 with severe right C6 foraminal stenosis, and mild spinal stenosis at C5-6 and C6-7, chronic borderline to mild multifactorial spinal stenosis at L2-3 through L4-5 with no convincing lateral recess or foraminal stenosis. She continued to report burning pain in her legs and was started on low dose gabapentin 300mg  qhs with no side effects. Dilantin was held in the hospital, she was discharged on a lower dose, but states that she has been taking her pre-hospital dose of 160mg  in AM, 200mg  in PM but has had 4 seizures since hospital discharge 2 weeks ago, which is unusual. She had 2 seizures in one day in the car with her mother-in-law, her mother-in-law had to pull over. The next day she had 2 at home, her daughter found her in the bathroom. She has not been sleeping well the past few days. She has a headache after the seizures. Aside from the convulsions, she has episodes where a feeling comes over her left side, from the arm down her leg, then she is weaker after. She denies any jerking. She is quite anxious in the office today and perseverates on her symptoms. She states she "stays nervous." She reports taking prn Ativan  in the past, but discontinued at [facility]. She repeatedly states her feet are numb and they "feel like they are grabbed." She feels gabapentin has helped some with this.   Prior AEDs: phenobarbital (ineffective, mood issues), Depakote (multiple trials, ineffective), Keppra (ineffective), Zonisamide (side effects on brief trial of higher dose than prescribed - 500mg  instead of 200mg ), Onfi (mouth sores), Tegretol (ineffective), Lamictal (nausea, dizziness, malaise), Felbatol (nausea, dizziness, malaise), Topamax (ineffective), Tigabine (tried per notes, details unclear), Neurontin (ineffective, nausea), Lyrica (leg pain reported on 25mg  daily, tingling)  Epilepsy Risk Factors:  Right parieto-occipital AVM s/p rupture and hemorrhage in 1988, s/p craniectomy. Otherwise she had a normal birth and early development.  There is no history of febrile convulsions, CNS infections such as meningitis/encephalitis, or family history of seizures.   PAST MEDICAL HISTORY: Past Medical History:  Diagnosis Date  . Asthma   . Cerebral hemorrhage ([place]) 1989  . Chronic back pain   . COPD (chronic obstructive pulmonary disease) ([place])   . DDD (degenerative disc disease) 06/12/2013  . DVT (deep venous thrombosis) ([place])    "she's had several since 1990"  . GERD (gastroesophageal reflux disease)   . Headache    "usually around time when she's had a seizure"  . History of blood transfusion    "when she was a baby"  . History of stomach ulcers   . Kidney stones   . Neuropathy   .  Seizures ([place])    "because of her brain surgery"  . Stroke [facility]) [phone]   family denies residual on 11/09/2014  . Tunnel vision    "since brain OR"    MEDICATIONS:  Outpatient Encounter Medications as of 09/07/2018  Medication Sig Note  . albuterol (PROVENTIL HFA;VENTOLIN HFA) 108 (90 Base) MCG/ACT inhaler Inhale 2 puffs into the lungs every 6 (six) hours as needed for wheezing or shortness of breath.   . cholecalciferol (VITAMIN D3)  25 MCG (1000 UT) tablet Take 1,000 Units by mouth 2 (two) times daily.   . Cyanocobalamin (VITAMIN B-12) 1000 MCG TABS Take 1,000 mcg by mouth daily.   Marland Kitchen escitalopram (LEXAPRO) 10 MG tablet Take 1 tablet (10 mg total) by mouth daily. (Patient taking differently: Take 10 mg by mouth at bedtime. )   . gabapentin (NEURONTIN) 300 MG capsule Take 1 capsule (300 mg total) by mouth 2 (two) times daily. (Patient taking differently: Take 600 mg by mouth at bedtime. )   . lacosamide (VIMPAT) 200 MG TABS tablet Take 1 tablet (200 mg total) by mouth 2 (two) times daily. 07/16/2018: #180/90 days filed 06/30/18 [employer] per PMP AWARE  . phenytoin (DILANTIN) 100 MG ER capsule Take 1 capsule (100 mg total) by mouth 2 (two) times daily. Get Level checked by primary care physician once a week for the next 4 weeks.   . phenytoin (DILANTIN) 30 MG ER capsule Take 2 capsules (60 mg total) by mouth 2 (two) times daily. Take two capsules (60 mg) by mouth BID with a 100 mg capsule for a total dose of 160 mg    No facility-administered encounter medications on file as of 09/07/2018.     ALLERGIES: Allergies  Allergen Reactions  . Zonisamide Other (See Comments)    Numbness and tingling over whole body  . Codeine Nausea And Vomiting  . Keppra [Levetiracetam] Other (See Comments)    Causes seizures  . Pregabalin Nausea And Vomiting  . Olive Oil Rash    FAMILY HISTORY: Family History  Problem Relation Age of Onset  . Heart attack Mother        4 MIs, started in her 59s, also vaginal cancer and colon cancer  . Cancer Mother   . Heart attack Father        Died suddenly age 32 - autopsy revealed heart attack her patient  . Cancer Brother        Sinus cancer    SOCIAL HISTORY: Social History   Socioeconomic History  . Marital status: Divorced    Spouse name: Not on file  . Number of children: Not on file  . Years of education: Not on file  . Highest education level: Not on file  Occupational History  . Not  on file  Social Needs  . Financial resource strain: Not on file  . Food insecurity:    Worry: Not on file    Inability: Not on file  . Transportation needs:    Medical: Not on file    Non-medical: Not on file  Tobacco Use  . Smoking status: Former Smoker    Packs/day: 0.50    Years: 20.00    Pack years: 10.00    Types: Cigarettes    Last attempt to quit: 10/26/2017    Years since quitting: 0.8  . Smokeless tobacco: Never Used  . Tobacco comment: Previously smoked 2 ppd, down to 4 cigs/day  Substance and Sexual Activity  . Alcohol use: No  . Drug  use: No  . Sexual activity: Never    Birth control/protection: Post-menopausal  Lifestyle  . Physical activity:    Days per week: Not on file    Minutes per session: Not on file  . Stress: Not on file  Relationships  . Social connections:    Talks on phone: Not on file    Gets together: Not on file    Attends religious service: Not on file    Active member of club or organization: Not on file    Attends meetings of clubs or organizations: Not on file    Relationship status: Not on file  . Intimate partner violence:    Fear of current or ex partner: Not on file    Emotionally abused: Not on file    Physically abused: Not on file    Forced sexual activity: Not on file  Other Topics Concern  . Not on file  Social History Narrative   Pt lives in 2 story home with her daughter, daughter's family and pt's mother-in-law   Has 2 living children / 1 deceased   11/13/2022 grade education   Worked for [employer] until 1998 when she became disabled.     REVIEW OF SYSTEMS: Constitutional: No fevers, chills, or sweats, no generalized fatigue, change in appetite Eyes: No visual changes, double vision, eye pain Ear, nose and throat: No hearing loss, ear pain, nasal congestion, sore throat Cardiovascular: No chest pain, palpitations Respiratory:  No shortness of breath at rest or with exertion, wheezes GastrointestinaI: No nausea, vomiting,  diarrhea, abdominal pain, fecal incontinence Genitourinary:  No dysuria, urinary retention or frequency Musculoskeletal:  No neck pain, back pain Integumentary: No rash, pruritus, skin lesions Neurological: as above Psychiatric: No depression, insomnia, +anxiety Endocrine: No palpitations, fatigue, diaphoresis, mood swings, change in appetite, change in weight, increased thirst Hematologic/Lymphatic:  No anemia, purpura, petechiae. Allergic/Immunologic: no itchy/runny eyes, nasal congestion, recent allergic reactions, rashes  PHYSICAL EXAM: Vitals:   09/07/18 1350  BP: (!) 100/58  Pulse: 78  SpO2: 95%   General: No acute distress, anxious Head:  Normocephalic/atraumatic Neck: supple, +cervical dystonia and tremor with head turn to the right Heart:  Regular rate and rhythm Lungs:  Clear to auscultation bilaterally Back: No paraspinal tenderness Skin/Extremities: No rash, no edema Neurological Exam: alert and oriented to person, place, and time. No aphasia or dysarthria. Fund of knowledge is appropriate.  Recent and remote memory are intact.  Attention and concentration are normal.    Able to name objects and repeat phrases. Cranial nerves: CN I: not tested CN II: pupils equal, round and reactive to light, left homonymous hemianopia (similar to prior) CN III, IV, VI:  full range of motion, no nystagmus, no ptosis CN VII: upper and lower face symmetric Bulk & Tone: normal, no fasciculations Motor: 5/5 throughout with no pronator drift. Sensation: intact to light touch Deep Tendon Reflexes: +2 on right UE and LE, left UE, brisk +3 on left LE, no ankle clonus Plantar responses: downgoing bilaterally Cerebellar: no incoordination on finger to nose testing Gait: narrow-based and steady, able to tandem walk adequately. Tremor: head tremor with neck dystonic posturing (head turned to the right). 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 She has tried multiple AEDs in the  past. She has been on Dilantin for many years but recently has had 3 admissions for Dilantin toxicity. We discussed that at this point, would stay on lower dose Dilantin 160mg  in AM, 130mg  in PM and adjust her other medications. Increase Gabapentin to 300mg  in AM, 600mg  in PM for a week, then 600mg  BID, side effects discussed. Continue Vimpat 200mg  BID. Addition of Fycompa will be considered on her next follow-up. She has been reading about RNS, we discussed that returning to [facility] for discussion of surgical options/evaluation if this is something she would now like to proceed with. Continue Lexapro 10mg  daily for mood. She has right cervical dystonia and will be referred to Dr. Carles Collet for evaluation and treatment. Refer to home PT for back pain and left leg weakness. She does not drive. Follow-up in 4 months, she knows to call for any changes.   Thank you for allowing me to participate in her care.  Please do not hesitate to call for any questions or concerns.  The duration of this appointment visit was 30 minutes of face-to-face time with the patient.  Greater than 50% of this time was spent in counseling, explanation of diagnosis, planning of further management, and coordination of care.   Ellouise Newer, M.D.   CC: Dr. Maudie Mercury

## 2018-09-07 NOTE — Patient Instructions (Signed)
1. Increase gabapentin 300mg : take 1 capsule in AM, 2 capsule in PM for a week, then increase to 2 capsules twice a day  2. Continue Dilantin 160mg  in AM, 130mg  in PM  3. Continue Vimpat 200mg  twice a day  4. Refer to Dr. Carles Collet for cervical dystonia  5. Refer for home PT for back pain and left leg weakness  6. Follow-up in 4 months, call for any changes  Seizure Precautions: 1. If medication has been prescribed for you to prevent seizures, take it exactly as directed.  Do not stop taking the medicine without talking to your doctor first, even if you have not had a seizure in a long time.   2. Avoid activities in which a seizure would cause danger to yourself or to others.  Don't operate dangerous machinery, swim alone, or climb in high or dangerous places, such as on ladders, roofs, or girders.  Do not drive unless your doctor says you may.  3. If you have any warning that you may have a seizure, lay down in a safe place where you can't hurt yourself.    4.  No driving for 6 months from last seizure, as per [facility].   Please refer to the following link on the [employer] website for more information: [email or web address]   5.  Maintain good sleep hygiene. Avoid alcohol.  6.  Contact your doctor if you have any problems that may be related to the medicine you are taking.  7.  Call 911 and bring the patient back to the ED if:        A.  The seizure lasts longer than 5 minutes.       B.  The patient doesn't awaken shortly after the seizure  C.  The patient has new problems such as difficulty seeing, speaking or moving  D.  The patient was injured during the seizure  E.  The patient has a temperature over 102 F (39C)  F.  The patient vomited and now is having trouble breathing

## 2018-09-08 ENCOUNTER — Other Ambulatory Visit: Payer: Self-pay

## 2018-09-08 DIAGNOSIS — R269 Unspecified abnormalities of gait and mobility: Secondary | ICD-10-CM

## 2018-09-08 DIAGNOSIS — R531 Weakness: Secondary | ICD-10-CM

## 2018-09-08 DIAGNOSIS — G629 Polyneuropathy, unspecified: Secondary | ICD-10-CM

## 2018-09-08 DIAGNOSIS — R202 Paresthesia of skin: Secondary | ICD-10-CM

## 2018-09-10 DIAGNOSIS — G9009 Other idiopathic peripheral autonomic neuropathy: Secondary | ICD-10-CM | POA: Diagnosis not present

## 2018-09-10 DIAGNOSIS — G40219 Localization-related (focal) (partial) symptomatic epilepsy and epileptic syndromes with complex partial seizures, intractable, without status epilepticus: Secondary | ICD-10-CM | POA: Diagnosis not present

## 2018-09-10 DIAGNOSIS — J449 Chronic obstructive pulmonary disease, unspecified: Secondary | ICD-10-CM | POA: Diagnosis not present

## 2018-09-10 DIAGNOSIS — M6281 Muscle weakness (generalized): Secondary | ICD-10-CM | POA: Diagnosis not present

## 2018-09-10 DIAGNOSIS — G243 Spasmodic torticollis: Secondary | ICD-10-CM | POA: Diagnosis not present

## 2018-09-10 DIAGNOSIS — M545 Low back pain: Secondary | ICD-10-CM | POA: Diagnosis not present

## 2018-09-10 DIAGNOSIS — Z87891 Personal history of nicotine dependence: Secondary | ICD-10-CM | POA: Diagnosis not present

## 2018-09-14 DIAGNOSIS — G9009 Other idiopathic peripheral autonomic neuropathy: Secondary | ICD-10-CM | POA: Diagnosis not present

## 2018-09-14 DIAGNOSIS — G243 Spasmodic torticollis: Secondary | ICD-10-CM | POA: Diagnosis not present

## 2018-09-14 DIAGNOSIS — G40219 Localization-related (focal) (partial) symptomatic epilepsy and epileptic syndromes with complex partial seizures, intractable, without status epilepticus: Secondary | ICD-10-CM | POA: Diagnosis not present

## 2018-09-14 DIAGNOSIS — J449 Chronic obstructive pulmonary disease, unspecified: Secondary | ICD-10-CM | POA: Diagnosis not present

## 2018-09-14 DIAGNOSIS — Z87891 Personal history of nicotine dependence: Secondary | ICD-10-CM | POA: Diagnosis not present

## 2018-09-14 DIAGNOSIS — M6281 Muscle weakness (generalized): Secondary | ICD-10-CM | POA: Diagnosis not present

## 2018-09-14 DIAGNOSIS — M545 Low back pain: Secondary | ICD-10-CM | POA: Diagnosis not present

## 2018-09-16 DIAGNOSIS — J449 Chronic obstructive pulmonary disease, unspecified: Secondary | ICD-10-CM | POA: Diagnosis not present

## 2018-09-16 DIAGNOSIS — G40219 Localization-related (focal) (partial) symptomatic epilepsy and epileptic syndromes with complex partial seizures, intractable, without status epilepticus: Secondary | ICD-10-CM | POA: Diagnosis not present

## 2018-09-16 DIAGNOSIS — M6281 Muscle weakness (generalized): Secondary | ICD-10-CM | POA: Diagnosis not present

## 2018-09-16 DIAGNOSIS — G9009 Other idiopathic peripheral autonomic neuropathy: Secondary | ICD-10-CM | POA: Diagnosis not present

## 2018-09-16 DIAGNOSIS — Z87891 Personal history of nicotine dependence: Secondary | ICD-10-CM | POA: Diagnosis not present

## 2018-09-16 DIAGNOSIS — G243 Spasmodic torticollis: Secondary | ICD-10-CM | POA: Diagnosis not present

## 2018-09-16 DIAGNOSIS — M545 Low back pain: Secondary | ICD-10-CM | POA: Diagnosis not present

## 2018-09-21 DIAGNOSIS — J449 Chronic obstructive pulmonary disease, unspecified: Secondary | ICD-10-CM | POA: Diagnosis not present

## 2018-09-21 DIAGNOSIS — M545 Low back pain: Secondary | ICD-10-CM | POA: Diagnosis not present

## 2018-09-21 DIAGNOSIS — G9009 Other idiopathic peripheral autonomic neuropathy: Secondary | ICD-10-CM | POA: Diagnosis not present

## 2018-09-21 DIAGNOSIS — M6281 Muscle weakness (generalized): Secondary | ICD-10-CM | POA: Diagnosis not present

## 2018-09-21 DIAGNOSIS — Z87891 Personal history of nicotine dependence: Secondary | ICD-10-CM | POA: Diagnosis not present

## 2018-09-21 DIAGNOSIS — G40219 Localization-related (focal) (partial) symptomatic epilepsy and epileptic syndromes with complex partial seizures, intractable, without status epilepticus: Secondary | ICD-10-CM | POA: Diagnosis not present

## 2018-09-21 DIAGNOSIS — G243 Spasmodic torticollis: Secondary | ICD-10-CM | POA: Diagnosis not present

## 2018-09-22 NOTE — Progress Notes (Signed)
Received FYI message from Dominica Severin, physical therapy assistant with [facility].  Message states that pt had a fall on 2.14.2020 as a result of 2 back-to-back seizures.  Pt did not feel the need to be seen, however it was advised that pt seek medical attention if this happens again.  Message also states that pt required assistance getting up after fall, sore on R side shoulder and back pain after.  Able to regain normal conversation within 20-60 minutes post seizure.    Verbal per [name] - pt seen 02.04.2020; no need for urgent f/u at this time.  Gabapentin was recently increased to 300mg  cap; 2 caps BID.  Wait to see how pt does after more time on increased Gabapentin.  Call if more issues and will assess at that time if pt appointment is needed.   Roque Lias and [facility] advising of Dr. Amparo Bristol recommendations above.

## 2018-09-22 NOTE — Progress Notes (Signed)
[name] was seen today in neurologic consultation at the request of [name].  Patient's primary care provider is [name] Gravel, MD.  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  This patient is accompanied in the office by her daughter who supplements the history.Multiple records from prior neurologist have been reviewed, both from [facility] as well as from [name].  Patient has also seen Dr. Kyra [name] at [facility] regarding cervical dystonia in 2015.  The patient is a 63 y.o. 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  She was referred to Dr. Hall Busing in 2015 for head tremor, which was diagnosed as cervical dystonia.  On examination at the time, her head was noted to be tilted to the right with irregular "no" tremor of the head.  She was offered Botox, but it does not appear that the patient ever proceeded with that.  Pt doesn't recall that.  States that she has some neck pain.  She doesn't like her head shaking and notes that she holds her chin to make it stop.  States that she feels that her head pulls to the left.  States that people tell her the L shoulder is raised.     Neuroimaging has  previously been performed.  Last MRI of the brain was in October, 2019.  There was stable right parieto-occipital encephalomalacia.    ALLERGIES:   Allergies  Allergen Reactions  . Zonisamide Other (See Comments)    Numbness and tingling over whole body  . Codeine Nausea And Vomiting  . Keppra [Levetiracetam] Other (See Comments)    Causes seizures  . Pregabalin Nausea And Vomiting  . Olive Oil Rash    CURRENT MEDICATIONS:  Outpatient Encounter Medications as of 09/24/2018  Medication Sig  . albuterol (PROVENTIL HFA;VENTOLIN HFA) 108 (90 Base) MCG/ACT inhaler Inhale 2 puffs into the lungs every 6 (six) hours as needed for wheezing or shortness of breath.  . cholecalciferol  (VITAMIN D3) 25 MCG (1000 UT) tablet Take 1,000 Units by mouth 2 (two) times daily.  . Cyanocobalamin (VITAMIN B-12) 1000 MCG TABS Take 1,000 mcg by mouth daily.  Marland Kitchen escitalopram (LEXAPRO) 10 MG tablet Take 1 tablet (10 mg total) by mouth daily. (Patient taking differently: Take 10 mg by mouth at bedtime. )  . gabapentin (NEURONTIN) 300 MG capsule Take 2 capsules twice a day (Patient taking differently: Take 1 capsules twice a day)  . lacosamide (VIMPAT) 200 MG TABS tablet Take 1 tablet (200 mg total) by mouth 2 (two) times daily.  . phenytoin (DILANTIN) 100 MG ER capsule Take 1 capsule in AM (take with 60mg  dose for a total of 160mg ) and 1 capsule in PM (with 30mg  dose for total of 130mg ) (Patient taking differently: Take 100 mg by mouth 2 (two) times daily. Take with 30 mg tablet)  . phenytoin (DILANTIN) 30 MG ER capsule Take two capsules (60 mg) in AM (take with 100mg  for total of 160mg ), then take 1 capsule (30mg ) in PM (take with 100mg  for total of 130mg ) (Patient taking differently: Take 30 mg by mouth 2 (two) times daily. Take with 100 mg tablets)   No facility-administered encounter medications on file as of 09/24/2018.     PAST MEDICAL HISTORY:   Past Medical History:  Diagnosis Date  . Asthma   . Cerebral hemorrhage ([place]) 1989  . Chronic back pain   . COPD (  chronic obstructive pulmonary disease) ([place])   . DDD (degenerative disc disease) 06/12/2013  . DVT (deep venous thrombosis) ([place])    "she's had several since 1990"  . GERD (gastroesophageal reflux disease)   . Headache    "usually around time when she's had a seizure"  . History of blood transfusion    "when she was a baby"  . History of stomach ulcers   . Kidney stones   . Neuropathy   . Seizures ([place])    "because of her brain surgery"  . Stroke [facility]) [phone]   family denies residual on 11/09/2014  . Tunnel vision    "since brain OR"    PAST SURGICAL HISTORY:   Past Surgical History:  Procedure Laterality Date  . [place]   craniotomy with hematoma evacuation  . CESAREAN SECTION  1979; 1987; 1989  . HEMORRHOID SURGERY    . TUBAL LIGATION  1990's    SOCIAL HISTORY:   Social History   Socioeconomic History  . Marital status: Divorced    Spouse name: Not on file  . Number of children: Not on file  . Years of education: Not on file  . Highest education level: Not on file  Occupational History  . Not on file  Social Needs  . Financial resource strain: Not on file  . Food insecurity:    Worry: Not on file    Inability: Not on file  . Transportation needs:    Medical: Not on file    Non-medical: Not on file  Tobacco Use  . Smoking status: Former Smoker    Packs/day: 0.50    Years: 20.00    Pack years: 10.00    Types: Cigarettes    Last attempt to quit: 10/26/2017    Years since quitting: 0.9  . Smokeless tobacco: Never Used  . Tobacco comment: Previously smoked 2 ppd, down to 4 cigs/day  Substance and Sexual Activity  . Alcohol use: No  . Drug use: No  . Sexual activity: Never    Birth control/protection: Post-menopausal  Lifestyle  . Physical activity:    Days per week: Not on file    Minutes per session: Not on file  . Stress: Not on file  Relationships  . Social connections:    Talks on phone: Not on file    Gets together: Not on file    Attends religious service: Not on file    Active member of club or organization: Not on file    Attends meetings of clubs or organizations: Not on file    Relationship status: Not on file  . Intimate partner violence:    Fear of current or ex partner: Not on file    Emotionally abused: Not on file    Physically abused: Not on file    Forced sexual activity: Not on file  Other Topics Concern  . Not on file  Social History Narrative   Pt lives in 2 story home with her daughter, daughter's family and pt's mother-in-law   Has 2 living children / 1 deceased   11/03/2022 grade education   Worked for [employer] until 1998 when she became  disabled.     FAMILY HISTORY:   Family Status  Relation Name Status  . Mother  Alive  . Father  Deceased at age 67       heart attack  . Brother  Deceased    ROS:  Review of Systems  HENT: Negative.   Eyes:  Negative.   Respiratory: Negative.   Cardiovascular: Negative.   Gastrointestinal: Negative.   Genitourinary: Negative.   Musculoskeletal: Positive for back pain, myalgias and neck pain.  Skin: Negative.   Neurological: Positive for seizures (several back to back).    PHYSICAL EXAMINATION:    VITALS:   Vitals:   09/24/18 1339  BP: 130/74  Pulse: 68  SpO2: 93%  Weight: 136 lb (61.7 kg)  Height: 5\' 7"  (1.702 m)    GEN:  Normal appears female in no acute distress.  Appears stated age. HEENT:  Normocephalic, atraumatic. The mucous membranes are moist. The superficial temporal arteries are without ropiness or tenderness. Cardiovascular: Regular rate and rhythm. Lungs: Clear to auscultation bilaterally. Neck/Heme: There are no carotid bruits noted bilaterally.   MS:  Neck is rotated to the left and side bent to the right with the right ear to the right shoulder.  L shoulder is lifted.  She has a geste antagoniste (holds chin with finger and sometimes will hold back of occiput)  NEUROLOGICAL: Orientation:  The patient is alert and oriented x 3.   Cranial nerves: There is good facial symmetry. The pupils are equal round and reactive to light bilaterally. Fundoscopic exam reveals clear disc margins bilaterally. Extraocular muscles are intact and visual fields are full to confrontational testing. Speech is fluent and clear. Soft palate rises symmetrically and there is no tongue deviation. Hearing is intact to conversational tone. Tone: Tone is good throughout. Sensation: Sensation is intact to light touch  Coordination:  The patient has no difficulty with RAM's or FNF bilaterally. Motor: Strength is 5/5 in the bilateral upper and lower extremities.  Shoulder shrug is equal  and symmetric. There is no pronator drift.  There are no fasciculations noted. DTR's: Deep tendon reflexes are 2/4 at the bilateral biceps, triceps, brachioradialis, patella and achilles.  Plantar responses are downgoing bilaterally. Gait and Station: The patient pushes off of the chair to arise.  She is wide-based and unbalanced today.   IMPRESSION/PLAN  1. Cervical Dystonia  -I talked to the patient about the nature and pathophysiology.  The patient is having trouble with ADL's and with rotation of the head in daily life for driving.  The primary muscles involved are the right sternocleidomastoid, left splenius capitis, right levator scapula.  We talked about treatments.  We talked about the value of botox.  The patient was educated on the botulinum toxin the black blox warning and given a copy of the botox patient medication guide.  The patient understands that this warning states that there have been reported cases of the Botox extending beyond the injection site and creating adverse effects, similar to those of botulism. This included loss of strength, trouble walking, hoarseness, trouble saying words clearly, loss of bladder control, trouble breathing, trouble swallowing, diplopia, blurry vision and ptosis. Most of the distant spread of Botox was happening in patients, primarily children, who received medication for spasticity or for cervical dystonia. The patient expressed understanding and desire to proceed.  I will try to get authorization.  2.  Intractable epilepsy  -Patient is seeing [name], and care will be deferred to her.  Will let [name] know of recent seizures.  3.  Much greater than 50% of this visit was spent in counseling and coordinating care.  Total face to face time:  40 min       Cc:  [name] Gravel, MD

## 2018-09-23 DIAGNOSIS — M6281 Muscle weakness (generalized): Secondary | ICD-10-CM | POA: Diagnosis not present

## 2018-09-23 DIAGNOSIS — G40219 Localization-related (focal) (partial) symptomatic epilepsy and epileptic syndromes with complex partial seizures, intractable, without status epilepticus: Secondary | ICD-10-CM | POA: Diagnosis not present

## 2018-09-23 DIAGNOSIS — J449 Chronic obstructive pulmonary disease, unspecified: Secondary | ICD-10-CM | POA: Diagnosis not present

## 2018-09-23 DIAGNOSIS — G9009 Other idiopathic peripheral autonomic neuropathy: Secondary | ICD-10-CM | POA: Diagnosis not present

## 2018-09-23 DIAGNOSIS — Z87891 Personal history of nicotine dependence: Secondary | ICD-10-CM | POA: Diagnosis not present

## 2018-09-23 DIAGNOSIS — G243 Spasmodic torticollis: Secondary | ICD-10-CM | POA: Diagnosis not present

## 2018-09-23 DIAGNOSIS — M545 Low back pain: Secondary | ICD-10-CM | POA: Diagnosis not present

## 2018-09-24 ENCOUNTER — Ambulatory Visit: Payer: Medicare HMO | Admitting: Neurology

## 2018-09-24 ENCOUNTER — Encounter: Payer: Self-pay | Admitting: Neurology

## 2018-09-24 VITALS — BP 130/74 | HR 68 | Ht 67.0 in | Wt 136.0 lb

## 2018-09-24 DIAGNOSIS — G40219 Localization-related (focal) (partial) symptomatic epilepsy and epileptic syndromes with complex partial seizures, intractable, without status epilepticus: Secondary | ICD-10-CM

## 2018-09-24 DIAGNOSIS — G243 Spasmodic torticollis: Secondary | ICD-10-CM | POA: Diagnosis not present

## 2018-09-24 NOTE — Patient Instructions (Addendum)
We will work on [employer] authorization. Appointments made. We will let you know if we have a problem with your insurance prior to these appointments.

## 2018-09-27 ENCOUNTER — Telehealth: Payer: Self-pay | Admitting: Neurology

## 2018-09-27 DIAGNOSIS — G243 Spasmodic torticollis: Secondary | ICD-10-CM | POA: Diagnosis not present

## 2018-09-27 DIAGNOSIS — M6281 Muscle weakness (generalized): Secondary | ICD-10-CM | POA: Diagnosis not present

## 2018-09-27 DIAGNOSIS — J449 Chronic obstructive pulmonary disease, unspecified: Secondary | ICD-10-CM | POA: Diagnosis not present

## 2018-09-27 DIAGNOSIS — M545 Low back pain: Secondary | ICD-10-CM | POA: Diagnosis not present

## 2018-09-27 DIAGNOSIS — G40219 Localization-related (focal) (partial) symptomatic epilepsy and epileptic syndromes with complex partial seizures, intractable, without status epilepticus: Secondary | ICD-10-CM | POA: Diagnosis not present

## 2018-09-27 DIAGNOSIS — G9009 Other idiopathic peripheral autonomic neuropathy: Secondary | ICD-10-CM | POA: Diagnosis not present

## 2018-09-27 DIAGNOSIS — Z87891 Personal history of nicotine dependence: Secondary | ICD-10-CM | POA: Diagnosis not present

## 2018-09-27 NOTE — Telephone Encounter (Signed)
[name] wanted to document a fall for this patient on the 20th from 2 consecutive seizures. Thanks!

## 2018-09-27 NOTE — Telephone Encounter (Signed)
I believe this is the same incident that I have already spoken to [place] about.  pls see 2.19.2020 documentation.

## 2018-10-06 ENCOUNTER — Telehealth: Payer: Self-pay | Admitting: Neurology

## 2018-10-06 DIAGNOSIS — G243 Spasmodic torticollis: Secondary | ICD-10-CM | POA: Diagnosis not present

## 2018-10-06 DIAGNOSIS — Z87891 Personal history of nicotine dependence: Secondary | ICD-10-CM | POA: Diagnosis not present

## 2018-10-06 DIAGNOSIS — M545 Low back pain: Secondary | ICD-10-CM | POA: Diagnosis not present

## 2018-10-06 DIAGNOSIS — G40219 Localization-related (focal) (partial) symptomatic epilepsy and epileptic syndromes with complex partial seizures, intractable, without status epilepticus: Secondary | ICD-10-CM | POA: Diagnosis not present

## 2018-10-06 DIAGNOSIS — M6281 Muscle weakness (generalized): Secondary | ICD-10-CM | POA: Diagnosis not present

## 2018-10-06 DIAGNOSIS — J449 Chronic obstructive pulmonary disease, unspecified: Secondary | ICD-10-CM | POA: Diagnosis not present

## 2018-10-06 DIAGNOSIS — G9009 Other idiopathic peripheral autonomic neuropathy: Secondary | ICD-10-CM | POA: Diagnosis not present

## 2018-10-06 NOTE — Telephone Encounter (Signed)
Collier Salina called and is needing to speak with you regarding this patient having 2 Seizures last week and the week before. He also wants to discuss her Medications. Please Call. Thanks

## 2018-10-06 NOTE — Telephone Encounter (Signed)
Collier Salina called needing to speak with you regarding this patient. She has had about 2 Seizures last week and the week before. He also needs to go over medications. Please Call. Thanks

## 2018-10-06 NOTE — Telephone Encounter (Signed)
Error

## 2018-10-07 NOTE — Telephone Encounter (Signed)
Spoke with Collier Salina, physical therapist.   He states that pt has been experiencing more seizure activity.  States that pt believes seizures related to Gabapentin, so she has decided to cut dosage in half - taking 1 Tab BID instead of 2 Tab BID.  She has been taking Gabapentin this way for 1 week so far.   Collier Salina goes on to say that pt is also falling more often.  Asks for verbal orders for OT as well as Social Work.  Verbal given.  Collier Salina states that he believes pt would benefit from walker for stability.  States that he will place order.   Asks that we contact him, and not Dominica Severin as Dominica Severin is is Environmental consultant.  Collier Salina CB# [phone]

## 2018-10-07 NOTE — Telephone Encounter (Signed)
Forwarding to Dr. Aquino as FYI 

## 2018-10-08 DIAGNOSIS — G9009 Other idiopathic peripheral autonomic neuropathy: Secondary | ICD-10-CM | POA: Diagnosis not present

## 2018-10-08 DIAGNOSIS — Z87891 Personal history of nicotine dependence: Secondary | ICD-10-CM | POA: Diagnosis not present

## 2018-10-08 DIAGNOSIS — J449 Chronic obstructive pulmonary disease, unspecified: Secondary | ICD-10-CM | POA: Diagnosis not present

## 2018-10-08 DIAGNOSIS — N390 Urinary tract infection, site not specified: Secondary | ICD-10-CM | POA: Diagnosis not present

## 2018-10-08 DIAGNOSIS — G40219 Localization-related (focal) (partial) symptomatic epilepsy and epileptic syndromes with complex partial seizures, intractable, without status epilepticus: Secondary | ICD-10-CM | POA: Diagnosis not present

## 2018-10-08 DIAGNOSIS — G243 Spasmodic torticollis: Secondary | ICD-10-CM | POA: Diagnosis not present

## 2018-10-08 DIAGNOSIS — M545 Low back pain: Secondary | ICD-10-CM | POA: Diagnosis not present

## 2018-10-08 DIAGNOSIS — M6281 Muscle weakness (generalized): Secondary | ICD-10-CM | POA: Diagnosis not present

## 2018-10-08 NOTE — Telephone Encounter (Signed)
Can you see if she can come in noon on Monday or Tues?Thanks

## 2018-10-11 NOTE — Telephone Encounter (Signed)
Spoke with pt.  She is out of town today.  Planning on coming back either tonight or tomorrow AM.  Offered 3.10.2020 @ noon.  Pt states that she will call office to either schedule or let us know if she cannot make it at that time.  Due to her not driving, she has to arrange a ride.    Forwarding to [place], [place] as FYI for when pt calls back.  OK to schedule at 12pm tomorrow if pt available.

## 2018-10-12 ENCOUNTER — Encounter: Payer: Self-pay | Admitting: Neurology

## 2018-10-12 ENCOUNTER — Ambulatory Visit: Payer: Medicare HMO | Admitting: Neurology

## 2018-10-12 VITALS — BP 128/66 | HR 64 | Ht 67.0 in | Wt 136.0 lb

## 2018-10-12 DIAGNOSIS — G40219 Localization-related (focal) (partial) symptomatic epilepsy and epileptic syndromes with complex partial seizures, intractable, without status epilepticus: Secondary | ICD-10-CM | POA: Diagnosis not present

## 2018-10-12 MED ORDER — PERAMPANEL 2 MG PO TABS: ORAL_TABLET | ORAL | 0 refills | Status: DC

## 2018-10-12 MED ORDER — PERAMPANEL 4 MG PO TABS: ORAL_TABLET | ORAL | 5 refills | Status: DC

## 2018-10-12 MED ORDER — GABAPENTIN 300 MG PO CAPS: ORAL_CAPSULE | ORAL | 3 refills | Status: DC

## 2018-10-12 NOTE — Patient Instructions (Signed)
1. Start Fycompa sample pack: take 2mg  tablet every night for 1 week, then 4mg  tablet every night, then your new prescription for Fycompa 4mg  every night will be at your pharmacy  2. Continue all your other medications  3. Follow-up with Dr Tat for Botox and as scheduled with me in June, call for any changes

## 2018-10-12 NOTE — Progress Notes (Signed)
NEUROLOGY FOLLOW UP OFFICE NOTE  [name] [id number] 07-28-1956  HISTORY OF PRESENT ILLNESS: I had the pleasure of seeing [name] in follow-up in the neurology clinic on 10/12/2018.  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 She is alone in the office today. Since her last visit, she has had home PT work with her. Our office has received several concerned calls regarding seizure activity. In February, PT reported a fall due to back to back seizures. We had recently increased gabapentin to 600mg  BID. We received another call last week about more seizure activity and report that patient self-reduced gabapentin to 300mg  BID due to concern this was increasing her seizures. He also reported more falls. Patient presents today reporting that she is having more seizures, now she is having seizure clusters, last month she had 2 seizures back to back, her mother-in-law witnessed it and reported that she had the second convulsive seizure as she was starting to come out of the first one. Most recent seizure was last Wed or Thurs with urinary incontinence. Family noted urine was foul-smelling, she was found to have a UTI and started on antibiotics. She states she is taking Dilantin 160mg  in AM, 130mg  in PM, Vimpat 200mg  BID, and Gabapentin 300mg  BID. She is also noting more shakiness in her hands and sometimes feeling like her heart is going so fast. She now has a walker she uses around her house. She has been evaluated by our Movement Disorders specialist Dr. Carles Collet and will be having Botox soon for cervical dystonia.   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 She had been seeing epileptologist [name], records were reviewed and will be summarized  as follows. In the past, seizures would start with headache and burning sensation of the left arm, which may be followed by left arm jerking. Over time, she has not had any prior warning symptoms. She has had seizures where she is noted to have kicking and rocking movements of the pelvis with unresponsiveness. She was in the EMU in 10/2012 where 11 seizures were captured from the right posterior hemisphere/right inferior temporal region. Interictal EEG showed abundant right posterior temporal epileptiform discharges, frequent right anterior temporal epileptiform discharges, as well as occasional independent left anterior temporal epileptiform discharges. There was focal slowing over the right posterior hemisphere. MRI brain in 2014 showed right parietal/occipital encephalomalacia, right hippocampus smaller than left. PET scan in 2015 showed multiple areas of hypometabolism on the right, mostly corresponding to or adjacent to the structural abnormality. Ictal SPECT showed right hemisphere hypoperfusion. SISCOM showed right frontal region significant on the subtraction. MEG in 07/2013 showed spikes localizing to the right occipital, parietal, mesial temporal regions with a weak signal left mesial parietal. Concomitant EEG showed right posterior spikes. Functional mapping suggestive of left language lateralization. She has tried multiple AEDs in the past with various side effects or ineffective, and has been taking Dilantin 160mg  in AM, 200mg  in PM and Vimpat 200mg  BID with seizures around once a month. She was admitted to [facility] on 03/19/18 for left leg weakness and drowsiness. She was fishing with her boyfriend when she suddenly could not move her left leg and almost fell. She states that her boyfriend told her that she was shaking and had a seizure  while they were fishing. She was brought to [facility] where Dilantin level was 33.8. She was adamant that she has been taking her medications as instructed and did not take more  than prescribed, no new medications taken. She had an MRI brain without contrast which I personally reviewed, no acute changes seen. Large volume right parieto-occipital encephalomalacia was unchanged. Right hippocampus is smaller. She continued to report weakness and had an MRI cervical and lumbar spine which did not show any myelopathy, there was prominent left-sided facet hypertrophy at C4-5 with resultant moderate left C5 foraminal stenosis, degenerative disc osteophyte at C5-6 with severe right C6 foraminal stenosis, and mild spinal stenosis at C5-6 and C6-7, chronic borderline to mild multifactorial spinal stenosis at L2-3 through L4-5 with no convincing lateral recess or foraminal stenosis. She continued to report burning pain in her legs and was started on low dose gabapentin 300mg  qhs with no side effects. Dilantin was held in the hospital, she was discharged on a lower dose, but states that she has been taking her pre-hospital dose of 160mg  in AM, 200mg  in PM but has had 4 seizures since hospital discharge 2 weeks ago, which is unusual. She had 2 seizures in one day in the car with her mother-in-law, her mother-in-law had to pull over. The next day she had 2 at home, her daughter found her in the bathroom. She has not been sleeping well the past few days. She has a headache after the seizures. Aside from the convulsions, she has episodes where a feeling comes over her left side, from the arm down her leg, then she is weaker after. She denies any jerking. She is quite anxious in the office today and perseverates on her symptoms. She states she "stays nervous." She reports taking prn Ativan in the past, but discontinued at [facility]. She repeatedly states her feet are numb and they "feel like they are grabbed." She feels gabapentin has helped some with this.   Prior AEDs: phenobarbital (ineffective, mood issues), Depakote (multiple trials, ineffective), Keppra (ineffective), Zonisamide (side effects on  brief trial of higher dose than prescribed - 500mg  instead of 200mg ), Onfi (mouth sores), Tegretol (ineffective), Lamictal (nausea, dizziness, malaise), Felbatol (nausea, dizziness, malaise), Topamax (ineffective), Tigabine (tried per notes, details unclear), Neurontin (ineffective, nausea), Lyrica (leg pain reported on 25mg  daily, tingling)  Epilepsy Risk Factors:  Right parieto-occipital AVM s/p rupture and hemorrhage in 1988, s/p craniectomy. Otherwise she had a normal birth and early development.  There is no history of febrile convulsions, CNS infections such as meningitis/encephalitis, or family history of seizures.  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  EEG done 06/2018 showed right posterior temporal slowing, occasional epileptiform discharges over the right posterior temporal region, breach artifact in this region.  MRI cervical, thoracic, and lumbar spine done 07/2018 showed no convincing cervical or thoracic spinal cord abnormality. Mild degenerative cervical spinal stenosis at C5-6 and C6-7 with moderate or severe bilateral foraminal stenosis; no thoracic spinal or foraminal stenosis; stable borderline to mild lumbar spinal stenosis MRI brain without contrast 05/2018 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  PAST MEDICAL HISTORY: Past Medical History:  Diagnosis Date  . Asthma   . Cerebral hemorrhage ([place]) 1989  . Chronic back pain   . COPD (chronic obstructive pulmonary disease) ([place])   . DDD (degenerative disc disease) 06/12/2013  . DVT (deep  venous thrombosis) ([place])    "she's had several since 1990"  . GERD (gastroesophageal reflux disease)   . Headache    "usually around time when she's had a seizure"  . History of blood transfusion    "when she was a baby"  . History of stomach  ulcers   . Kidney stones   . Neuropathy   . Seizures ([place])    "because of her brain surgery"  . Stroke [facility]) [phone]   family denies residual on 11/09/2014  . Tunnel vision    "since brain OR"    MEDICATIONS:  Outpatient Encounter Medications as of 10/12/2018  Medication Sig  . albuterol (PROVENTIL HFA;VENTOLIN HFA) 108 (90 Base) MCG/ACT inhaler Inhale 2 puffs into the lungs every 6 (six) hours as needed for wheezing or shortness of breath.  . cholecalciferol (VITAMIN D3) 25 MCG (1000 UT) tablet Take 1,000 Units by mouth 2 (two) times daily.  . Cyanocobalamin (VITAMIN B-12) 1000 MCG TABS Take 1,000 mcg by mouth daily.  Marland Kitchen escitalopram (LEXAPRO) 10 MG tablet Take 1 tablet (10 mg total) by mouth daily. (Patient taking differently: Take 10 mg by mouth at bedtime. )  . gabapentin (NEURONTIN) 300 MG capsule Take 2 capsules twice a day (Patient taking differently: Take 1 capsules twice a day)  . lacosamide (VIMPAT) 200 MG TABS tablet Take 1 tablet (200 mg total) by mouth 2 (two) times daily.  . phenytoin (DILANTIN) 100 MG ER capsule Take 1 capsule in AM (take with 60mg  dose for a total of 160mg ) and 1 capsule in PM (with 30mg  dose for total of 130mg ) (Patient taking differently: Take 100 mg by mouth 2 (two) times daily. Take with 30 mg tablet)  . phenytoin (DILANTIN) 30 MG ER capsule Take two capsules (60 mg) in AM (take with 100mg  for total of 160mg ), then take 1 capsule (30mg ) in PM (take with 100mg  for total of 130mg ) (Patient taking differently: Take 30 mg by mouth 2 (two) times daily. Take with 100 mg tablets)   No facility-administered encounter medications on file as of 10/12/2018.     ALLERGIES: Allergies  Allergen Reactions  . Zonisamide Other (See Comments)    Numbness and tingling over whole body  . Codeine Nausea And Vomiting  . Keppra [Levetiracetam] Other (See Comments)    Causes seizures  . Pregabalin Nausea And Vomiting  . Olive Oil Rash    FAMILY HISTORY: Family History    Problem Relation Age of Onset  . Heart attack Mother        4 MIs, started in her 45s, also vaginal cancer and colon cancer  . Cancer Mother   . Heart attack Father        Died suddenly age 69 - autopsy revealed heart attack her patient  . Cancer Brother        Sinus cancer    SOCIAL HISTORY: Social History   Socioeconomic History  . Marital status: Divorced    Spouse name: Not on file  . Number of children: Not on file  . Years of education: Not on file  . Highest education level: Not on file  Occupational History  . Not on file  Social Needs  . Financial resource strain: Not on file  . Food insecurity:    Worry: Not on file    Inability: Not on file  . Transportation needs:    Medical: Not on file    Non-medical: Not on file  Tobacco Use  . Smoking status:  Former Smoker    Packs/day: 0.50    Years: 20.00    Pack years: 10.00    Types: Cigarettes    Last attempt to quit: 10/26/2017    Years since quitting: 0.9  . Smokeless tobacco: Never Used  . Tobacco comment: Previously smoked 2 ppd, down to 4 cigs/day  Substance and Sexual Activity  . Alcohol use: No  . Drug use: No  . Sexual activity: Never    Birth control/protection: Post-menopausal  Lifestyle  . Physical activity:    Days per week: Not on file    Minutes per session: Not on file  . Stress: Not on file  Relationships  . Social connections:    Talks on phone: Not on file    Gets together: Not on file    Attends religious service: Not on file    Active member of club or organization: Not on file    Attends meetings of clubs or organizations: Not on file    Relationship status: Not on file  . Intimate partner violence:    Fear of current or ex partner: Not on file    Emotionally abused: Not on file    Physically abused: Not on file    Forced sexual activity: Not on file  Other Topics Concern  . Not on file  Social History Narrative   Pt lives in 2 story home with her daughter, daughter's family  and pt's mother-in-law   Has 2 living children / 1 deceased   2022/11/08 grade education   Worked for [employer] until 1998 when she became disabled.     REVIEW OF SYSTEMS: Constitutional: No fevers, chills, or sweats, no generalized fatigue, change in appetite Eyes: No visual changes, double vision, eye pain Ear, nose and throat: No hearing loss, ear pain, nasal congestion, sore throat Cardiovascular: No chest pain, palpitations Respiratory:  No shortness of breath at rest or with exertion, wheezes GastrointestinaI: No nausea, vomiting, diarrhea, abdominal pain, fecal incontinence Genitourinary:  No dysuria, urinary retention or frequency Musculoskeletal:  No neck pain, back pain Integumentary: No rash, pruritus, skin lesions Neurological: as above Psychiatric: No depression, insomnia, +anxiety Endocrine: No palpitations, fatigue, diaphoresis, mood swings, change in appetite, change in weight, increased thirst Hematologic/Lymphatic:  No anemia, purpura, petechiae. Allergic/Immunologic: no itchy/runny eyes, nasal congestion, recent allergic reactions, rashes  PHYSICAL EXAM: Vitals:   10/12/18 1149  BP: 128/66  Pulse: 64  SpO2: 94%   General: No acute distress Head:  Normocephalic/atraumatic Neck: supple, +cervical dystonia and tremor with head turn to the right (similar to prior) Heart:  Regular rate and rhythm Lungs:  Clear to auscultation bilaterally Back: No paraspinal tenderness Skin/Extremities: No rash, no edema Neurological Exam: alert and oriented to person, place, and time. No aphasia or dysarthria. Fund of knowledge is appropriate.  Recent and remote memory are intact.  Attention and concentration are normal.    Able to name objects and repeat phrases. Cranial nerves: CN I: not tested CN II: pupils equal, round and reactive to light, left homonymous hemianopia (similar to prior) CN III, IV, VI:  full range of motion, no nystagmus, no ptosis CN VII: upper and lower face  symmetric Bulk & Tone: normal, no fasciculations Motor: 5/5 throughout with no pronator drift. Sensation: intact to light touch Cerebellar: no incoordination on finger to nose testing Gait: narrow-based and steady, able to tandem walk adequately. Tremor: head tremor with neck dystonic posturing (head turned to the right). 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 She has tried multiple AEDs in the past. She has had several admissions for Dilantin toxicity, we agreed to stay on lower dose Dilantin 160mg  in AM, 130mg  in PM (for many years she has been on 160mg /200mg ). She is also on Vimpat 200mg  BID and self-reduced gabapentin to 300mg  BID due to increasing seizures on higher dose. I suspect the increase in seizures is due to lowered Dilantin dose, however with admissions for Dilantin toxicity, would stay on our current dose. We agreed to add on another AED, Fycompa, start 2mg  qhs x 1 week, then increase to 4mg  qhs. Side effects discussed. She does not drive. Proceed with Botox as planned for cervical dystonia. She will follow-up as scheduled in June and knows to call for any changes.   Thank you for allowing me to participate in her care.  Please do not hesitate to call for any questions or concerns.  The duration of this appointment visit was 30 minutes of face-to-face time with the patient.  Greater than 50% of this time was spent in counseling, explanation of diagnosis, planning of further management, and coordination of care.   Ellouise Newer, M.D.   CC: Dr. Maudie Mercury

## 2018-10-14 ENCOUNTER — Telehealth: Payer: Self-pay

## 2018-10-14 DIAGNOSIS — Z87891 Personal history of nicotine dependence: Secondary | ICD-10-CM | POA: Diagnosis not present

## 2018-10-14 DIAGNOSIS — M545 Low back pain: Secondary | ICD-10-CM | POA: Diagnosis not present

## 2018-10-14 DIAGNOSIS — G243 Spasmodic torticollis: Secondary | ICD-10-CM | POA: Diagnosis not present

## 2018-10-14 DIAGNOSIS — M6281 Muscle weakness (generalized): Secondary | ICD-10-CM | POA: Diagnosis not present

## 2018-10-14 DIAGNOSIS — G9009 Other idiopathic peripheral autonomic neuropathy: Secondary | ICD-10-CM | POA: Diagnosis not present

## 2018-10-14 DIAGNOSIS — G40219 Localization-related (focal) (partial) symptomatic epilepsy and epileptic syndromes with complex partial seizures, intractable, without status epilepticus: Secondary | ICD-10-CM | POA: Diagnosis not present

## 2018-10-14 DIAGNOSIS — J449 Chronic obstructive pulmonary disease, unspecified: Secondary | ICD-10-CM | POA: Diagnosis not present

## 2018-10-14 NOTE — Telephone Encounter (Signed)
Received after hours message from [place] with [facility] requesting verbal orders for  OT; twice a week x3weeks for strength/balance/endurance  Returned call to [place], verbal orders given.   Forwarding to [name] as Juluis Rainier

## 2018-10-15 NOTE — Telephone Encounter (Signed)
[name] for OT, thanks

## 2018-10-20 DIAGNOSIS — Z87891 Personal history of nicotine dependence: Secondary | ICD-10-CM | POA: Diagnosis not present

## 2018-10-20 DIAGNOSIS — G243 Spasmodic torticollis: Secondary | ICD-10-CM | POA: Diagnosis not present

## 2018-10-20 DIAGNOSIS — J449 Chronic obstructive pulmonary disease, unspecified: Secondary | ICD-10-CM | POA: Diagnosis not present

## 2018-10-20 DIAGNOSIS — G9009 Other idiopathic peripheral autonomic neuropathy: Secondary | ICD-10-CM | POA: Diagnosis not present

## 2018-10-20 DIAGNOSIS — G40219 Localization-related (focal) (partial) symptomatic epilepsy and epileptic syndromes with complex partial seizures, intractable, without status epilepticus: Secondary | ICD-10-CM | POA: Diagnosis not present

## 2018-10-20 DIAGNOSIS — M545 Low back pain: Secondary | ICD-10-CM | POA: Diagnosis not present

## 2018-10-20 DIAGNOSIS — M6281 Muscle weakness (generalized): Secondary | ICD-10-CM | POA: Diagnosis not present

## 2018-10-22 ENCOUNTER — Encounter: Payer: Self-pay | Admitting: Neurology

## 2018-10-22 DIAGNOSIS — G40219 Localization-related (focal) (partial) symptomatic epilepsy and epileptic syndromes with complex partial seizures, intractable, without status epilepticus: Secondary | ICD-10-CM | POA: Diagnosis not present

## 2018-10-22 DIAGNOSIS — G9009 Other idiopathic peripheral autonomic neuropathy: Secondary | ICD-10-CM | POA: Diagnosis not present

## 2018-10-22 DIAGNOSIS — G243 Spasmodic torticollis: Secondary | ICD-10-CM | POA: Diagnosis not present

## 2018-10-22 DIAGNOSIS — M6281 Muscle weakness (generalized): Secondary | ICD-10-CM | POA: Diagnosis not present

## 2018-10-22 DIAGNOSIS — J449 Chronic obstructive pulmonary disease, unspecified: Secondary | ICD-10-CM | POA: Diagnosis not present

## 2018-10-22 DIAGNOSIS — Z87891 Personal history of nicotine dependence: Secondary | ICD-10-CM | POA: Diagnosis not present

## 2018-10-22 DIAGNOSIS — M545 Low back pain: Secondary | ICD-10-CM | POA: Diagnosis not present

## 2018-10-26 DIAGNOSIS — G9009 Other idiopathic peripheral autonomic neuropathy: Secondary | ICD-10-CM | POA: Diagnosis not present

## 2018-10-26 DIAGNOSIS — J449 Chronic obstructive pulmonary disease, unspecified: Secondary | ICD-10-CM | POA: Diagnosis not present

## 2018-10-26 DIAGNOSIS — Z87891 Personal history of nicotine dependence: Secondary | ICD-10-CM | POA: Diagnosis not present

## 2018-10-26 DIAGNOSIS — M6281 Muscle weakness (generalized): Secondary | ICD-10-CM | POA: Diagnosis not present

## 2018-10-26 DIAGNOSIS — G243 Spasmodic torticollis: Secondary | ICD-10-CM | POA: Diagnosis not present

## 2018-10-26 DIAGNOSIS — G40219 Localization-related (focal) (partial) symptomatic epilepsy and epileptic syndromes with complex partial seizures, intractable, without status epilepticus: Secondary | ICD-10-CM | POA: Diagnosis not present

## 2018-10-26 DIAGNOSIS — M545 Low back pain: Secondary | ICD-10-CM | POA: Diagnosis not present

## 2018-10-26 NOTE — Progress Notes (Signed)
Received notice from [facility] that pt refused services on 10/15/2018

## 2018-10-28 ENCOUNTER — Ambulatory Visit: Payer: Self-pay | Admitting: Neurology

## 2018-10-28 DIAGNOSIS — G9009 Other idiopathic peripheral autonomic neuropathy: Secondary | ICD-10-CM | POA: Diagnosis not present

## 2018-10-28 DIAGNOSIS — Z87891 Personal history of nicotine dependence: Secondary | ICD-10-CM | POA: Diagnosis not present

## 2018-10-28 DIAGNOSIS — M545 Low back pain: Secondary | ICD-10-CM | POA: Diagnosis not present

## 2018-10-28 DIAGNOSIS — G40219 Localization-related (focal) (partial) symptomatic epilepsy and epileptic syndromes with complex partial seizures, intractable, without status epilepticus: Secondary | ICD-10-CM | POA: Diagnosis not present

## 2018-10-28 DIAGNOSIS — J449 Chronic obstructive pulmonary disease, unspecified: Secondary | ICD-10-CM | POA: Diagnosis not present

## 2018-10-28 DIAGNOSIS — G243 Spasmodic torticollis: Secondary | ICD-10-CM | POA: Diagnosis not present

## 2018-10-28 DIAGNOSIS — M6281 Muscle weakness (generalized): Secondary | ICD-10-CM | POA: Diagnosis not present

## 2018-10-29 ENCOUNTER — Encounter: Payer: Self-pay | Admitting: Neurology

## 2018-10-29 NOTE — Progress Notes (Signed)
Botox approval valid through 08/04/2019. Patient to use buy and bill.

## 2018-11-01 DIAGNOSIS — G9009 Other idiopathic peripheral autonomic neuropathy: Secondary | ICD-10-CM | POA: Diagnosis not present

## 2018-11-01 DIAGNOSIS — M6281 Muscle weakness (generalized): Secondary | ICD-10-CM | POA: Diagnosis not present

## 2018-11-01 DIAGNOSIS — M545 Low back pain: Secondary | ICD-10-CM | POA: Diagnosis not present

## 2018-11-01 DIAGNOSIS — G40219 Localization-related (focal) (partial) symptomatic epilepsy and epileptic syndromes with complex partial seizures, intractable, without status epilepticus: Secondary | ICD-10-CM | POA: Diagnosis not present

## 2018-11-01 DIAGNOSIS — J449 Chronic obstructive pulmonary disease, unspecified: Secondary | ICD-10-CM | POA: Diagnosis not present

## 2018-11-01 DIAGNOSIS — G243 Spasmodic torticollis: Secondary | ICD-10-CM | POA: Diagnosis not present

## 2018-11-01 DIAGNOSIS — Z87891 Personal history of nicotine dependence: Secondary | ICD-10-CM | POA: Diagnosis not present

## 2018-11-04 DIAGNOSIS — J449 Chronic obstructive pulmonary disease, unspecified: Secondary | ICD-10-CM | POA: Diagnosis not present

## 2018-11-04 DIAGNOSIS — G243 Spasmodic torticollis: Secondary | ICD-10-CM | POA: Diagnosis not present

## 2018-11-04 DIAGNOSIS — G9009 Other idiopathic peripheral autonomic neuropathy: Secondary | ICD-10-CM | POA: Diagnosis not present

## 2018-11-04 DIAGNOSIS — M6281 Muscle weakness (generalized): Secondary | ICD-10-CM | POA: Diagnosis not present

## 2018-11-04 DIAGNOSIS — M545 Low back pain: Secondary | ICD-10-CM | POA: Diagnosis not present

## 2018-11-04 DIAGNOSIS — G40219 Localization-related (focal) (partial) symptomatic epilepsy and epileptic syndromes with complex partial seizures, intractable, without status epilepticus: Secondary | ICD-10-CM | POA: Diagnosis not present

## 2018-11-04 DIAGNOSIS — Z87891 Personal history of nicotine dependence: Secondary | ICD-10-CM | POA: Diagnosis not present

## 2018-11-19 ENCOUNTER — Other Ambulatory Visit: Payer: Self-pay

## 2018-11-19 ENCOUNTER — Telehealth: Payer: Self-pay | Admitting: Neurology

## 2018-11-19 ENCOUNTER — Ambulatory Visit (INDEPENDENT_AMBULATORY_CARE_PROVIDER_SITE_OTHER): Payer: Medicare HMO | Admitting: Neurology

## 2018-11-19 DIAGNOSIS — G243 Spasmodic torticollis: Secondary | ICD-10-CM | POA: Diagnosis not present

## 2018-11-19 MED ORDER — ONABOTULINUMTOXINA 100 UNITS IJ SOLR
160.0000 [IU] | Freq: Once | INTRAMUSCULAR | Status: AC
  Administered 2018-11-19: 160 [IU] via INTRAMUSCULAR

## 2018-11-19 MED ORDER — ONABOTULINUMTOXINA 100 UNITS IJ SOLR: 200.0000 [IU] | Freq: Once | INTRAMUSCULAR | Status: DC

## 2018-11-19 MED ORDER — ONABOTULINUMTOXINA 100 UNITS IJ SOLR: 100.0000 [IU] | Freq: Once | INTRAMUSCULAR | Status: DC

## 2018-11-19 NOTE — Telephone Encounter (Signed)
Pt in for botox today.  Mentioned that she had a seizure on Saturday.  Showed me wound on back (center of thoracic spine).  No erythema or drainage and doesn't look infected.  Told her that I would let [name] know.   Next appt with [name] is in June

## 2018-11-19 NOTE — Telephone Encounter (Signed)
[name], can you pls call patient and ask the following:  1. Has she had any other seizures aside from the one last Saturday?  2. Has she been tolerating the new medication I added last month, Fycompa 4mg ?  3. Depending on her answers, I may adjust the dose of the Fycompa.  Thank you!

## 2018-11-19 NOTE — Procedures (Signed)
[facility]   Procedure Note Botox  Attending: Dr. Wells Guiles Tat  Preoperative Diagnosis(es): Cervical Dystonia  Result History  Onset of effect: n/a  Duration of Benefit: n/a  Adverse Effects: n/a   Consent obtained from: The patient Benefits discussed included, but were not limited to decreased muscle tightness, increased joint range of motion, and decreased pain.  Risk discussed included, but were not limited pain and discomfort, bleeding, bruising, excessive weakness, venous thrombosis, muscle atrophy and dysphagia.  A copy of the patient medication guide was given to the patient which explains the blackbox warning.  Patients identity and treatment sites confirmed Yes.  .  Details of Procedure: Skin was cleaned with alcohol.  A 30 gauge, 76mm  needle was introduced to the target muscle, except for posterior splenius where 27 gauge, 1.5 inch needle used.   Prior to injection, the needle plunger was aspirated to make sure the needle was not within a blood vessel.  There was no blood retrieved on aspiration.    Following is a summary of the muscles injected  And the amount of Botulinum toxin used:   Dilution 0.9% preservative free saline mixed with 100 u Botox type A to make 10 U per 0.1cc  Injections  Location Left  Right Units Number of sites        Sternocleidomastoid  50 50 1  Splenius Capitus, posterior approach 70  70 1  Splenius Capitus, lateral approach 40  40 1  Levator Scapulae      Trapezius            TOTAL UNITS:   160    Agent: Botulinum Type A ( Onobotulinum Toxin type A ).  2 vials of Botox were used, each containing 100 units and freshly diluted with 1 mL of sterile, non-preserved saline   Total injected (Units): 160  Total wasted (Units): 0   Pt tolerated procedure well without complications.   Reinjection is anticipated in 3 months.

## 2018-11-19 NOTE — Telephone Encounter (Signed)
Patient called back and I took answers of questions over the phone because [name] was on another line with different patient. Hope this helps!   1. No. Last seizure was Saturday. 2. Fycompa- could not take the medication; made her so dizzy. She stopped taking that medication because of that reason.   Told her I would relay answers to you and have a nurse call her back with any new info. Thanks yall!

## 2018-11-19 NOTE — Telephone Encounter (Signed)
Contacted the patient on her mobile, LVM for her to call the office.

## 2018-11-22 ENCOUNTER — Other Ambulatory Visit: Payer: Self-pay | Admitting: General Surgery

## 2018-11-22 MED ORDER — BRIVARACETAM 25 MG PO TABS: 25.0000 mg | ORAL_TABLET | Freq: Every day | ORAL | 0 refills | Status: DC

## 2018-11-22 NOTE — Telephone Encounter (Signed)
Let's try very low dose Briviact 25mg  every night. Pls provide 2-week samples and have her call in 2 weeks if no side effects, we will increase dose. Side effects sometimes include drowsiness and dizziness, hopefully taking low dose and taking at bedtime will help avoid these side effects. Thanks

## 2018-11-22 NOTE — Telephone Encounter (Signed)
[name] stated she would love to try a new medication. Explained to her that I would send Dr Delice Lesch a note and get back with her with the medication name. She stated that she would like to try samples if we can provide them.

## 2018-11-22 NOTE — Telephone Encounter (Signed)
Pls ask her if she would like to try another medication. She has tried multiple medications already in the past, but there are still a few she has not tried which we can provide samples for, if she would like. Thanks

## 2018-11-22 NOTE — Telephone Encounter (Signed)
Notified the patient of samples for her at front desk. Dr Delice Lesch would like her to try Briviact 25mg  daily X14days. She is to call back after 1 week and let us know how she is doing. The patient verbalized undrstanding and stated she will be in today to pick up sample

## 2018-11-24 ENCOUNTER — Telehealth: Payer: Self-pay | Admitting: Neurology

## 2018-11-24 NOTE — Telephone Encounter (Signed)
Contacted [name] and she stated she had a seizure yesterday and feels bad today. She states she stopped taking her dilantin several days ago because she was told to stop. I am not showing where she was told to dc her dilantin. Please advise

## 2018-11-24 NOTE — Telephone Encounter (Signed)
Pls let her know that she was not supposed to stop her Dilantin. She is still supposed to take her Dilantin 160mg  in AM, 130mg  in PM, Vimpat 200mg  BID, and Gabapentin 300mg  BID. She had side effects on the new medication I gave her, Fycompa, and told us that she had stopped it. She was to be given samples of a new medication Briviact 25mg  every night. She is supposed to take all 4 medications (Dilantin, Vimpat, Gabapentin, and Briviact). Pls have patient repeat instructions and have her write them down since she appears to have some difficulties comprehending them. Thanks

## 2018-11-24 NOTE — Telephone Encounter (Signed)
Spoke with [name] and gave her instructions of which medications she is to take per Dr Delice Lesch. She had stopped her Dilantin thinking someone told her to. Explained to her that she is to take it. Patient verbalized understanding and read back the medications. Patient will call if she has any other questions.

## 2018-11-24 NOTE — Telephone Encounter (Signed)
Patient called regarding her having a Seizure Yesterday 11/23/18. She said her Seizure medication had been changed. Please Call. Thanks

## 2018-11-30 ENCOUNTER — Telehealth: Payer: Self-pay | Admitting: Neurology

## 2018-11-30 ENCOUNTER — Telehealth: Payer: Self-pay

## 2018-11-30 ENCOUNTER — Other Ambulatory Visit: Payer: Self-pay | Admitting: Neurology

## 2018-11-30 MED ORDER — BRIVARACETAM 25 MG PO TABS: ORAL_TABLET | ORAL | 5 refills | Status: DC

## 2018-11-30 NOTE — Telephone Encounter (Signed)
Pls let her know I sent in a prescription for the Briviact 25mg  once a day. Pls have her call our office in 2 weeks for another update, if she continues to do well, we will plan to increase the dose. Thanks

## 2018-11-30 NOTE — Telephone Encounter (Signed)
Pt called no answer voice mail left for pt to call back 

## 2018-11-30 NOTE — Telephone Encounter (Signed)
I asked the patient and she stated that she thought you were the one who prescribed the medication. Sorry for the confusion.

## 2018-11-30 NOTE — Telephone Encounter (Signed)
PA sent in for Briviact 25mg . Waiting for reply, will know in 24-72 hrs ill check back on it tomorrow. Pt called no answer will try to call her again to keep her posted on her PA for her medication

## 2018-11-30 NOTE — Telephone Encounter (Signed)
Santiago Glad, they assigned this to me, but I think it must be for you.  I only see her for botox.  Chelsea, just make sure that when you assign the ones who see 2 physicians, that you make sure you ask the patient which doc.  Thanks!

## 2018-11-30 NOTE — Telephone Encounter (Signed)
Patient was told to call about the Briviact 25mg  medication. She said that it is working well and she is needing a prescription sent to the pharm on file. She is about out of the medication. Please send. Thanks!

## 2018-12-01 ENCOUNTER — Telehealth: Payer: Self-pay

## 2018-12-01 NOTE — Telephone Encounter (Signed)
PA was approved for 08/04/2018 - 12/32/2020 Case number [id number]

## 2018-12-07 ENCOUNTER — Other Ambulatory Visit: Payer: Self-pay

## 2018-12-07 ENCOUNTER — Telehealth: Payer: Self-pay | Admitting: Neurology

## 2018-12-07 MED ORDER — BRIVARACETAM 25 MG PO TABS: ORAL_TABLET | ORAL | 5 refills | Status: DC

## 2018-12-07 NOTE — Telephone Encounter (Signed)
Pt called back Prior Auth was done and approved in April (approved until 08/04/2019) pt was called in April when approved, pt wants script moved from [facility] to [facility] to have it mailed to her house with her other medications.

## 2018-12-07 NOTE — Telephone Encounter (Signed)
Patient called regarding her new medication Brivaracetam. She said she has not had a Seizure in 2 weeks. She said it's really working well. She said [facility] pharmacy is needing a Prior Auth completed so they can send her the Rx. She is unable to drive due to her Seizures so she would like it delivered through [facility]. Thanks

## 2018-12-10 ENCOUNTER — Other Ambulatory Visit: Payer: Self-pay

## 2018-12-10 ENCOUNTER — Other Ambulatory Visit: Payer: Self-pay | Admitting: Neurology

## 2018-12-10 ENCOUNTER — Telehealth: Payer: Self-pay | Admitting: Neurology

## 2018-12-10 MED ORDER — BRIVARACETAM 25 MG PO TABS: 25.0000 mg | ORAL_TABLET | Freq: Two times a day (BID) | ORAL | 5 refills | Status: DC

## 2018-12-10 NOTE — Telephone Encounter (Signed)
Pt called medication was increased new RX sent to [facility]

## 2018-12-10 NOTE — Telephone Encounter (Signed)
Pls let her know the new medication Briviact is still at a low dose, I would like to increase it and hopefully she has not side effects on slightly higher dose, Pls send in Rx for Briviact 25mg  BID, thanks

## 2018-12-10 NOTE — Telephone Encounter (Signed)
Pt called no answer voice mail left for pt to call back 

## 2018-12-10 NOTE — Telephone Encounter (Signed)
Patient states that she had a seizure yesterday (12-09-18)  afternoon while hang out clothes

## 2018-12-16 ENCOUNTER — Other Ambulatory Visit: Payer: Self-pay

## 2018-12-16 MED ORDER — BRIVARACETAM 25 MG PO TABS: 25.0000 mg | ORAL_TABLET | Freq: Two times a day (BID) | ORAL | 5 refills | Status: DC

## 2018-12-17 ENCOUNTER — Other Ambulatory Visit: Payer: Self-pay | Admitting: Neurology

## 2018-12-17 MED ORDER — BRIVARACETAM 25 MG PO TABS: 25.0000 mg | ORAL_TABLET | Freq: Two times a day (BID) | ORAL | 5 refills | Status: DC

## 2018-12-22 ENCOUNTER — Ambulatory Visit: Payer: Self-pay | Admitting: Neurology

## 2018-12-24 ENCOUNTER — Ambulatory Visit: Payer: Medicare HMO | Admitting: Neurology

## 2018-12-28 ENCOUNTER — Ambulatory Visit: Payer: Self-pay | Admitting: Neurology

## 2019-01-03 DIAGNOSIS — S92902A Unspecified fracture of left foot, initial encounter for closed fracture: Secondary | ICD-10-CM | POA: Diagnosis not present

## 2019-01-03 DIAGNOSIS — S92352A Displaced fracture of fifth metatarsal bone, left foot, initial encounter for closed fracture: Secondary | ICD-10-CM | POA: Diagnosis not present

## 2019-01-03 DIAGNOSIS — S99922A Unspecified injury of left foot, initial encounter: Secondary | ICD-10-CM | POA: Diagnosis not present

## 2019-01-03 NOTE — Progress Notes (Deleted)
[name] was seen today in neurologic consultation at the request of [name].  Patient's primary care provider is [name] Gravel, MD.  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  This patient is accompanied in the office by her daughter who supplements the history.Multiple records from prior neurologist have been reviewed, both from [facility] as well as from [name].  Patient has also seen Dr. Kyra [name] at [facility] regarding cervical dystonia in 2015.  The patient is a 63 y.o. 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  She was referred to Dr. Hall Busing in 2015 for head tremor, which was diagnosed as cervical dystonia.  On examination at the time, her head was noted to be tilted to the right with irregular "no" tremor of the head.  She was offered Botox, but it does not appear that the patient ever proceeded with that.  Pt doesn't recall that.  States that she has some neck pain.  She doesn't like her head shaking and notes that she holds her chin to make it stop.  States that she feels that her head pulls to the left.  States that people tell her the L shoulder is raised.     Neuroimaging has  previously been performed.  Last MRI of the brain was in October, 2019.  There was stable right parieto-occipital encephalomalacia.  01/04/19 update: Patient seen today in follow-up for cervical dystonia, s/p botox injections on November 19, 2018.  Patient reports no side effects with the Botox.  She reports ***.    ALLERGIES:   Allergies  Allergen Reactions  . Zonisamide Other (See Comments)    Numbness and tingling over whole body  . Codeine Nausea And Vomiting  . Keppra [Levetiracetam] Other (See Comments)    Causes seizures  . Pregabalin Nausea And Vomiting  . Olive Oil Rash    CURRENT MEDICATIONS:  Outpatient Encounter Medications as of 01/04/2019  Medication Sig  . albuterol  (PROVENTIL HFA;VENTOLIN HFA) 108 (90 Base) MCG/ACT inhaler Inhale 2 puffs into the lungs every 6 (six) hours as needed for wheezing or shortness of breath.  . brivaracetam (BRIVIACT) 25 MG TABS tablet Take 1 tablet (25 mg total) by mouth 2 (two) times daily.  . cholecalciferol (VITAMIN D3) 25 MCG (1000 UT) tablet Take 1,000 Units by mouth 2 (two) times daily.  . Cyanocobalamin (VITAMIN B-12) 1000 MCG TABS Take 1,000 mcg by mouth daily.  Marland Kitchen escitalopram (LEXAPRO) 10 MG tablet Take 1 tablet (10 mg total) by mouth daily. (Patient taking differently: Take 10 mg by mouth at bedtime. )  . gabapentin (NEURONTIN) 300 MG capsule Take 1 capsules twice a day  . lacosamide (VIMPAT) 200 MG TABS tablet Take 1 tablet (200 mg total) by mouth 2 (two) times daily.  . phenytoin (DILANTIN) 100 MG ER capsule Take 1 capsule in AM (take with 60mg  dose for a total of 160mg ) and 1 capsule in PM (with 30mg  dose for total of 130mg ) (Patient taking differently: Take 100 mg by mouth 2 (two) times daily. Take with 30 mg tablet)  . phenytoin (DILANTIN) 30 MG ER capsule Take two capsules (60 mg) in AM (take with 100mg  for total of 160mg ), then take 1 capsule (30mg ) in PM (take with 100mg  for total of 130mg ) (Patient taking differently: Take 30 mg by mouth 2 (two) times daily. Take with 100 mg tablets)   Facility-Administered  Encounter Medications as of 01/04/2019  Medication  . botulinum toxin Type A (BOTOX) injection 200 Units    PAST MEDICAL HISTORY:   Past Medical History:  Diagnosis Date  . Asthma   . Cerebral hemorrhage ([place]) 1989  . Chronic back pain   . COPD (chronic obstructive pulmonary disease) ([place])   . DDD (degenerative disc disease) 06/12/2013  . DVT (deep venous thrombosis) ([place])    "she's had several since 1990"  . GERD (gastroesophageal reflux disease)   . Headache    "usually around time when she's had a seizure"  . History of blood transfusion    "when she was a baby"  . History of stomach ulcers   .  Kidney stones   . Neuropathy   . Seizures ([place])    "because of her brain surgery"  . Stroke [facility]) [phone]   family denies residual on 11/09/2014  . Tunnel vision    "since brain OR"    PAST SURGICAL HISTORY:   Past Surgical History:  Procedure Laterality Date  . [place]   craniotomy with hematoma evacuation  . CESAREAN SECTION  1979; 1987; 1989  . HEMORRHOID SURGERY    . TUBAL LIGATION  1990's    SOCIAL HISTORY:   Social History   Socioeconomic History  . Marital status: Divorced    Spouse name: Not on file  . Number of children: Not on file  . Years of education: Not on file  . Highest education level: Not on file  Occupational History  . Not on file  Social Needs  . Financial resource strain: Not on file  . Food insecurity:    Worry: Not on file    Inability: Not on file  . Transportation needs:    Medical: Not on file    Non-medical: Not on file  Tobacco Use  . Smoking status: Former Smoker    Packs/day: 0.50    Years: 20.00    Pack years: 10.00    Types: Cigarettes    Last attempt to quit: 10/26/2017    Years since quitting: 1.1  . Smokeless tobacco: Never Used  . Tobacco comment: Previously smoked 2 ppd, down to 4 cigs/day  Substance and Sexual Activity  . Alcohol use: No  . Drug use: No  . Sexual activity: Never    Birth control/protection: Post-menopausal  Lifestyle  . Physical activity:    Days per week: Not on file    Minutes per session: Not on file  . Stress: Not on file  Relationships  . Social connections:    Talks on phone: Not on file    Gets together: Not on file    Attends religious service: Not on file    Active member of club or organization: Not on file    Attends meetings of clubs or organizations: Not on file    Relationship status: Not on file  . Intimate partner violence:    Fear of current or ex partner: Not on file    Emotionally abused: Not on file    Physically abused: Not on file    Forced sexual activity: Not  on file  Other Topics Concern  . Not on file  Social History Narrative   Pt lives in 2 story home with her daughter, daughter's family and pt's mother-in-law   Has 2 living children / 1 deceased   10-21-22 grade education   Worked for [employer] until 1998 when she became disabled.  FAMILY HISTORY:   Family Status  Relation Name Status  . Mother  Alive  . Father  Deceased at age 54       heart attack  . Brother  Deceased    ROS:  ROS  PHYSICAL EXAMINATION:    VITALS:   There were no vitals filed for this visit.  GEN:  Normal appears female in no acute distress.  Appears stated age. HEENT:  Normocephalic, atraumatic. The mucous membranes are moist. The superficial temporal arteries are without ropiness or tenderness. Cardiovascular: Regular rate and rhythm. Lungs: Clear to auscultation bilaterally. Neck/Heme: There are no carotid bruits noted bilaterally.   MS:  Neck is rotated to the left and side bent to the right with the right ear to the right shoulder.  L shoulder is lifted.  She has a geste antagoniste (holds chin with finger and sometimes will hold back of occiput)  NEUROLOGICAL: Orientation:  The patient is alert and oriented x 3.   Cranial nerves: There is good facial symmetry. The pupils are equal round and reactive to light bilaterally. Fundoscopic exam reveals clear disc margins bilaterally. Extraocular muscles are intact and visual fields are full to confrontational testing. Speech is fluent and clear. Soft palate rises symmetrically and there is no tongue deviation. Hearing is intact to conversational tone. Tone: Tone is good throughout. Sensation: Sensation is intact to light touch  Coordination:  The patient has no difficulty with RAM's or FNF bilaterally. Motor: Strength is 5/5 in the bilateral upper and lower extremities.  Shoulder shrug is equal and symmetric. There is no pronator drift.  There are no fasciculations noted. DTR's: Deep tendon reflexes are 2/4 at  the bilateral biceps, triceps, brachioradialis, patella and achilles.  Plantar responses are downgoing bilaterally. Gait and Station: The patient pushes off of the chair to arise.  She is wide-based and unbalanced today.   IMPRESSION/PLAN  1. Cervical Dystonia  -Patient is status post first injections of Botox in April, 2020. ***  2.  Intractable epilepsy  -Patient is seeing [name], and care will be deferred to her.    3.  ***       Cc:  [name] Gravel, MD

## 2019-01-04 ENCOUNTER — Ambulatory Visit: Payer: Medicare HMO | Admitting: Neurology

## 2019-01-04 DIAGNOSIS — M79672 Pain in left foot: Secondary | ICD-10-CM | POA: Diagnosis not present

## 2019-01-04 DIAGNOSIS — S92413A Displaced fracture of proximal phalanx of unspecified great toe, initial encounter for closed fracture: Secondary | ICD-10-CM

## 2019-01-04 DIAGNOSIS — S92353A Displaced fracture of fifth metatarsal bone, unspecified foot, initial encounter for closed fracture: Secondary | ICD-10-CM

## 2019-01-04 DIAGNOSIS — S92512A Displaced fracture of proximal phalanx of left lesser toe(s), initial encounter for closed fracture: Secondary | ICD-10-CM | POA: Diagnosis not present

## 2019-01-04 DIAGNOSIS — S92352A Displaced fracture of fifth metatarsal bone, left foot, initial encounter for closed fracture: Secondary | ICD-10-CM | POA: Diagnosis not present

## 2019-01-04 HISTORY — DX: Displaced fracture of proximal phalanx of unspecified great toe, initial encounter for closed fracture: S92.413A

## 2019-01-04 HISTORY — DX: Displaced fracture of fifth metatarsal bone, unspecified foot, initial encounter for closed fracture: S92.353A

## 2019-01-05 ENCOUNTER — Encounter: Payer: Self-pay | Admitting: Neurology

## 2019-01-12 ENCOUNTER — Ambulatory Visit: Payer: Self-pay | Admitting: Neurology

## 2019-02-18 ENCOUNTER — Ambulatory Visit: Payer: Medicare Other | Admitting: Neurology

## 2019-03-10 ENCOUNTER — Telehealth: Payer: Self-pay | Admitting: Neurology

## 2019-03-10 ENCOUNTER — Other Ambulatory Visit: Payer: Self-pay

## 2019-03-10 ENCOUNTER — Encounter: Payer: Self-pay | Admitting: Neurology

## 2019-03-10 ENCOUNTER — Telehealth (INDEPENDENT_AMBULATORY_CARE_PROVIDER_SITE_OTHER): Payer: Medicare Other | Admitting: Neurology

## 2019-03-10 VITALS — Ht 67.0 in | Wt 136.0 lb

## 2019-03-10 DIAGNOSIS — G40219 Localization-related (focal) (partial) symptomatic epilepsy and epileptic syndromes with complex partial seizures, intractable, without status epilepticus: Secondary | ICD-10-CM | POA: Diagnosis not present

## 2019-03-10 MED ORDER — LACOSAMIDE 200 MG PO TABS: 200.0000 mg | ORAL_TABLET | Freq: Two times a day (BID) | ORAL | 3 refills | Status: DC

## 2019-03-10 MED ORDER — PHENYTOIN SODIUM EXTENDED 100 MG PO CAPS: ORAL_CAPSULE | ORAL | 3 refills | Status: DC

## 2019-03-10 MED ORDER — ESCITALOPRAM OXALATE 10 MG PO TABS: 10.0000 mg | ORAL_TABLET | Freq: Every day | ORAL | 3 refills | Status: DC

## 2019-03-10 MED ORDER — PHENYTOIN SODIUM EXTENDED 30 MG PO CAPS: 30.0000 mg | ORAL_CAPSULE | Freq: Two times a day (BID) | ORAL | 3 refills | Status: DC

## 2019-03-10 MED ORDER — GABAPENTIN 300 MG PO CAPS: ORAL_CAPSULE | ORAL | 3 refills | Status: DC

## 2019-03-10 NOTE — Progress Notes (Signed)
Virtual Visit via Video Note The purpose of this virtual visit is to provide medical care while limiting exposure to the novel coronavirus.    Consent was obtained for video visit:  Yes.   Answered questions that patient had about telehealth interaction:  Yes.   I discussed the limitations, risks, security and privacy concerns of performing an evaluation and management service by telemedicine. I also discussed with the patient that there may be a patient responsible charge related to this service. The patient expressed understanding and agreed to proceed.  Pt location: Home Physician Location: office Name of referring provider:  [name], MD I connected with [name] at patients initiation/request on 03/10/2019 at 11:30 AM EDT by video enabled telemedicine application and verified that I am speaking with the correct person using two identifiers. Pt MRN:  [id number] Pt DOB:  Oct 14, 1955 Video Participants:  [name];  [name] (daughter)   History of Present Illness:  The patient was seen as a virtual video visit on 03/10/2019. She was last seen 5 months ago for intractable epilepsy. She had side effects on Fycompa and was started on Briviact in April 2020. She could not tolerate low dose, she had drowsiness on 25mg  BID dosing and has been taking it once a day. She is also taking Dilantin 130mg  BID, Vimpat 200mg  BID, and has reduced gabapentin to 300mg  qhs for neuropathy. She continues to have recurrent seizures. She had 2 seizures around the time her mother passed away last 12-Mar-2023. She felt numb all over after the seizures. She had a seizure last week. She has had more anxiety and had to take an extra half tablet of her anxiety pill. She has seen our Movement Disorders specialist Dr. Carles Collet for botox for cervical dystonia which she is happy with, she has had 2 treatments and states her head does not bob up and down as much. She denies any headaches, dizziness, vision changes. She had a  fall 3 months ago where she had a foot fracture and wore a boot.   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 She had been seeing epileptologist [name], records were reviewed and will be summarized as follows. In the past, seizures would start with headache and burning sensation of the left arm, which may be followed by left arm jerking. Over time, she has not had any prior warning symptoms. She has had seizures where she is noted to have kicking and rocking movements of the pelvis with unresponsiveness. She was in the EMU in 10/2012 where 11 seizures were captured from the right posterior hemisphere/right inferior temporal region. Interictal EEG showed abundant right posterior temporal epileptiform discharges, frequent right anterior temporal epileptiform discharges, as well as occasional independent left anterior temporal epileptiform discharges. There was focal slowing over the right posterior hemisphere. MRI brain in 2014 showed right parietal/occipital encephalomalacia, right hippocampus smaller than left. PET scan in 2015 showed multiple areas of hypometabolism on the right, mostly corresponding to or adjacent to the structural abnormality. Ictal SPECT showed right hemisphere hypoperfusion. SISCOM showed right frontal region significant on the subtraction. MEG in 07/2013 showed spikes localizing to the right occipital, parietal, mesial temporal regions with a weak signal left mesial parietal. Concomitant EEG showed right posterior spikes. Functional mapping suggestive of left language lateralization.  She has tried multiple AEDs in the past with various side effects or ineffective, and has been taking Dilantin 160mg  in AM, 200mg  in PM and Vimpat 200mg  BID with seizures  around once a month. She was admitted to [facility] on 03/19/18 for left leg weakness and drowsiness. She was fishing with her boyfriend when she suddenly could not move her left leg and almost fell. She states that her boyfriend told her that she was shaking and had a seizure while they were fishing. She was brought to [facility] where Dilantin level was 33.8. She was adamant that she has been taking her medications as instructed and did not take more than prescribed, no new medications taken. She had an MRI brain without contrast which I personally reviewed, no acute changes seen. Large volume right parieto-occipital encephalomalacia was unchanged. Right hippocampus is smaller. She continued to report weakness and had an MRI cervical and lumbar spine which did not show any myelopathy, there was prominent left-sided facet hypertrophy at C4-5 with resultant moderate left C5 foraminal stenosis, degenerative disc osteophyte at C5-6 with severe right C6 foraminal stenosis, and mild spinal stenosis at C5-6 and C6-7, chronic borderline to mild multifactorial spinal stenosis at L2-3 through L4-5 with no convincing lateral recess or foraminal stenosis. She continued to report burning pain in her legs and was started on low dose gabapentin 300mg  qhs with no side effects. Dilantin was held in the hospital, she was discharged on a lower dose, but states that she has been taking her pre-hospital dose of 160mg  in AM, 200mg  in PM but has had 4 seizures since hospital discharge 2 weeks ago, which is unusual. She had 2 seizures in one day in the car with her mother-in-law, her mother-in-law had to pull over. The next day she had 2 at home, her daughter found her in the bathroom. She has not been sleeping well the past few days. She has a headache after the seizures. Aside from the convulsions, she has episodes where a feeling comes over her left side, from the arm down her leg, then she is weaker after. She denies any jerking. She is quite  anxious in the office today and perseverates on her symptoms. She states she "stays nervous." She reports taking prn Ativan in the past, but discontinued at [facility] She repeatedly states her feet are numb and they "feel like they are grabbed." She feels gabapentin has helped some with this.   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 Otherwise she had a normal birth and early development.  There is no history of febrile convulsions, CNS infections such as meningitis/encephalitis, or family history of seizures.  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  EEG done 06/2018 showed right posterior temporal slowing, occasional epileptiform discharges over the right posterior temporal region, breach artifact in this region.  MRI cervical, thoracic, and lumbar spine done 07/2018 showed no convincing cervical or thoracic spinal cord abnormality. Mild degenerative cervical spinal stenosis at C5-6 and C6-7 with  moderate or severe bilateral foraminal stenosis; no thoracic spinal or foraminal stenosis; stable borderline to mild lumbar spinal stenosis MRI brain without contrast 05/2018 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  Outpatient  Encounter Medications as of 03/10/2019  Medication Sig   albuterol (PROVENTIL HFA;VENTOLIN HFA) 108 (90 Base) MCG/ACT inhaler Inhale 2 puffs into the lungs every 6 (six) hours as needed for wheezing or shortness of breath.   cholecalciferol (VITAMIN D3) 25 MCG (1000 UT) tablet Take 1,000 Units by mouth 2 (two) times daily.   Cyanocobalamin (VITAMIN B-12) 1000 MCG TABS Take 1,000 mcg by mouth daily.   escitalopram (LEXAPRO) 10 MG tablet Take 1 tablet (10 mg total) by mouth daily.   gabapentin (NEURONTIN) 300 MG capsule Take 1 capsule every night   lacosamide (VIMPAT) 200 MG TABS tablet Take 1 tablet (200 mg total) by mouth 2 (two) times daily.   phenytoin (DILANTIN) 100 MG ER capsule Take 1 capsule twice a day (take with 30mg  capsule twice a day, total of 130mg  twice a day)   phenytoin (DILANTIN) 30 MG ER capsule Take 1 capsule (30 mg total) by mouth 2 (two) times daily. Take with 100 mg tablets   brivaracetam (BRIVIACT) 25 MG TABS tablet Take 1 tablet (25 mg total) by mouth 2 (two) times daily.                       Facility-Administered Encounter Medications as of 03/10/2019  Medication   botulinum toxin Type A (BOTOX) injection 200 Units     Observations/Objective:   Vitals:   03/10/19 1122  Weight: 136 lb (61.7 kg)  Height: 5\' 7"  (1.702 m)   GEN:  The patient appears stated age and is in NAD.  Neurological examination: Patient is awake, alert, oriented x 3. No aphasia or dysarthria. Intact fluency and comprehension. Remote and recent memory intact. Able to name and repeat. Cranial nerves: Extraocular movements intact with no nystagmus. No facial asymmetry. Motor: head is slightly rotated to the left with less tremor. She moves all extremities symmetrically, at least anti-gravity x 4. No incoordination on finger to nose testing. Gait: slow and cautious, no ataxia.  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 She has tried multiple AEDs in the past, most recently [place] and Briviact. She has had several admissions for Dilantin toxicity, and is now on Dilantin 130mg  BID in addition to Vimpat 200mg  BID and gabapentin 300mg  qhs (for neuropathy). We discussed how she has tried multiple medications and has been intolerant of them. At this point I discussed revisiting the option of epilepsy surgery, she and her daughter are again interested and will contact her epileptologist [name] at [facility]. She would like to try a different medication in the meantime, we discussed a trial of Cenobamate, side effects discussed, would start very slowly 12.5mg  qhs for at least 2 weeks, maybe longer before uptitration. 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 She does not drive. Follow-up in 3 months, they know to call for any changes.   Follow Up Instructions:   -I discussed the assessment and treatment plan with the patient. The patient was provided an opportunity to ask questions and all were answered. The patient agreed with the plan and  demonstrated an understanding of the instructions.   The patient was advised to call back or seek an in-person evaluation if the symptoms worsen or if the condition fails to improve as anticipated.    [name], MD

## 2019-03-10 NOTE — Telephone Encounter (Signed)
Patient said she was told about a new medication today that she should be taking and she was wanting to know the name of it. Thanks!

## 2019-03-11 NOTE — Telephone Encounter (Signed)
Daughter informed via voicemail  Fycompa 2mg  nightly for 1 week the increase to 4mg  nightly thereafter

## 2019-03-23 ENCOUNTER — Telehealth: Payer: Self-pay

## 2019-03-23 NOTE — Telephone Encounter (Signed)
[employer] [id number]  Referral has been completed.  They will contact the office if a Prior Auth is needed.

## 2019-03-24 ENCOUNTER — Telehealth: Payer: Self-pay

## 2019-03-24 NOTE — Telephone Encounter (Signed)
[employer]/ [employer] sent forms stating prior auth was required.  Forms completed and faxed back to [phone]

## 2019-03-30 IMAGING — DX DG CHEST 1V PORT
2 series · 2 of 2 positions shown · non-contrast
Comparison: Chest radiograph November 01, 2015

CLINICAL DATA: Mid chest pain, tachycardia. History of COPD,
chronic bronchitis, asthma.

EXAM:
PORTABLE CHEST 1 VIEW

[chest ap (1 of 2)]
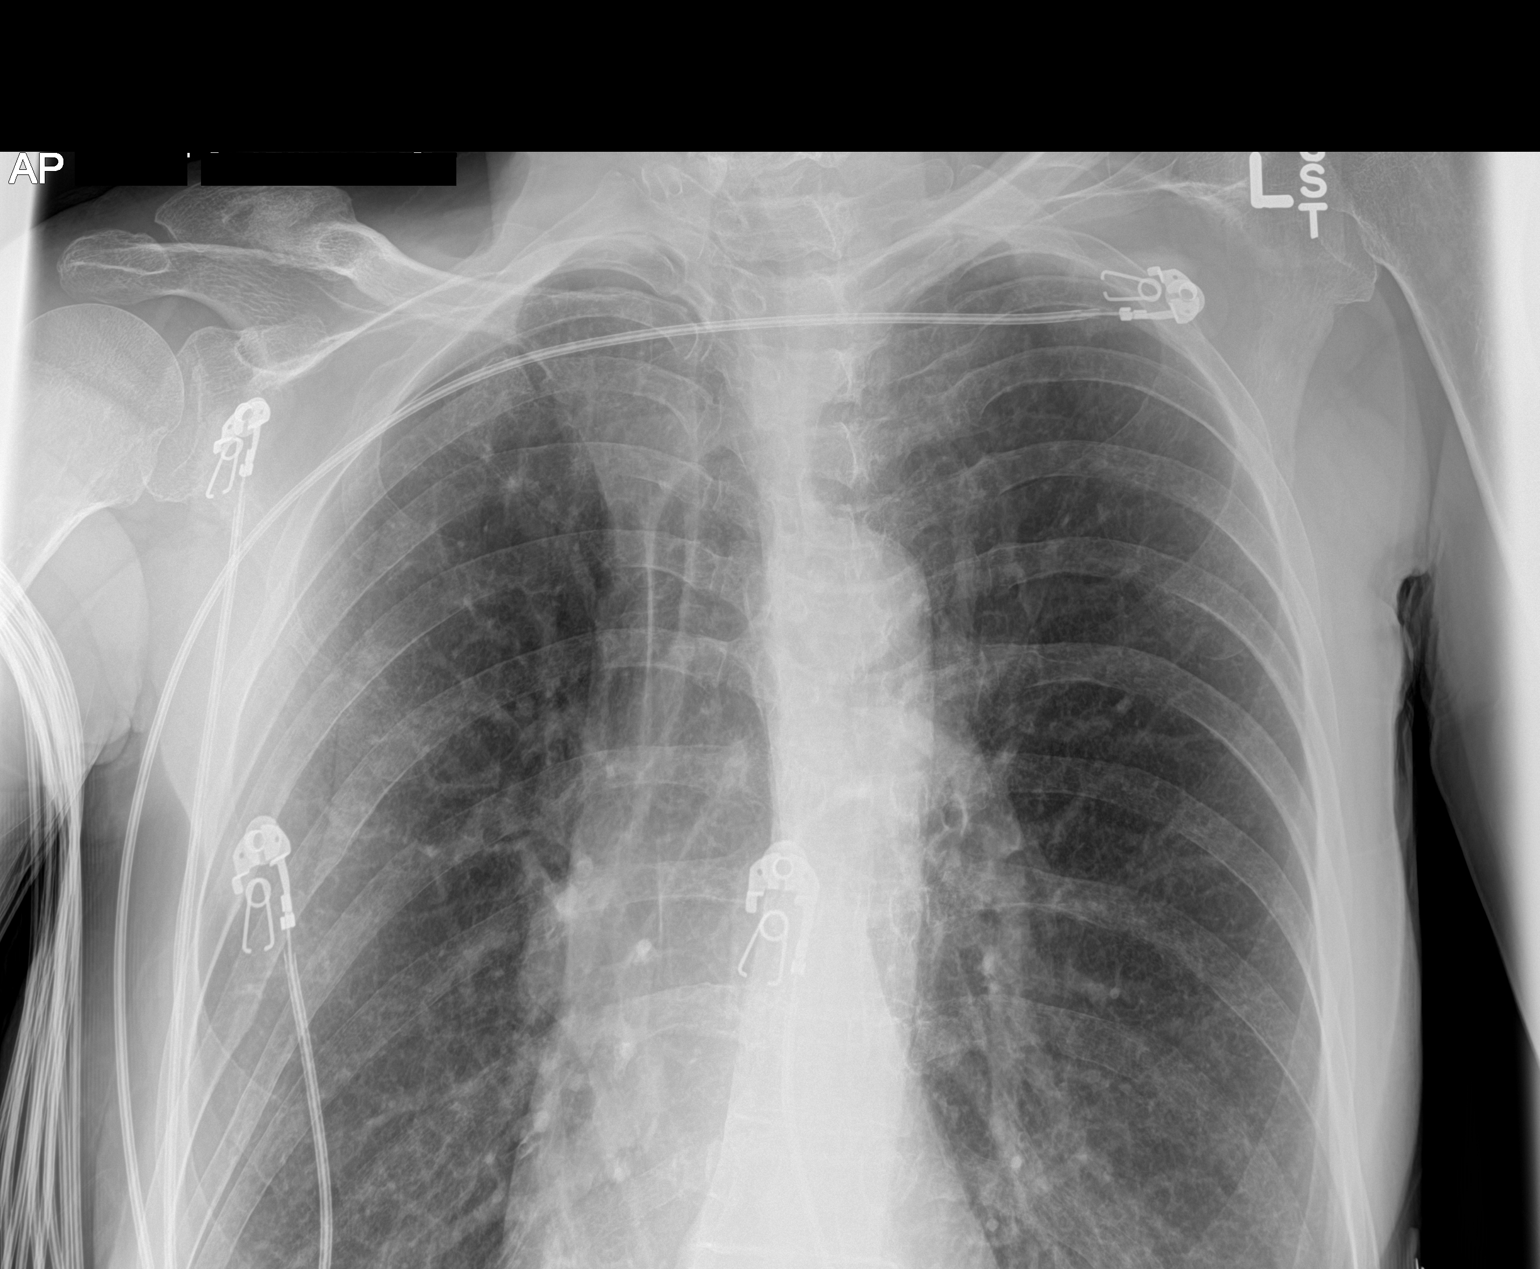

[chest ap (2 of 2)]
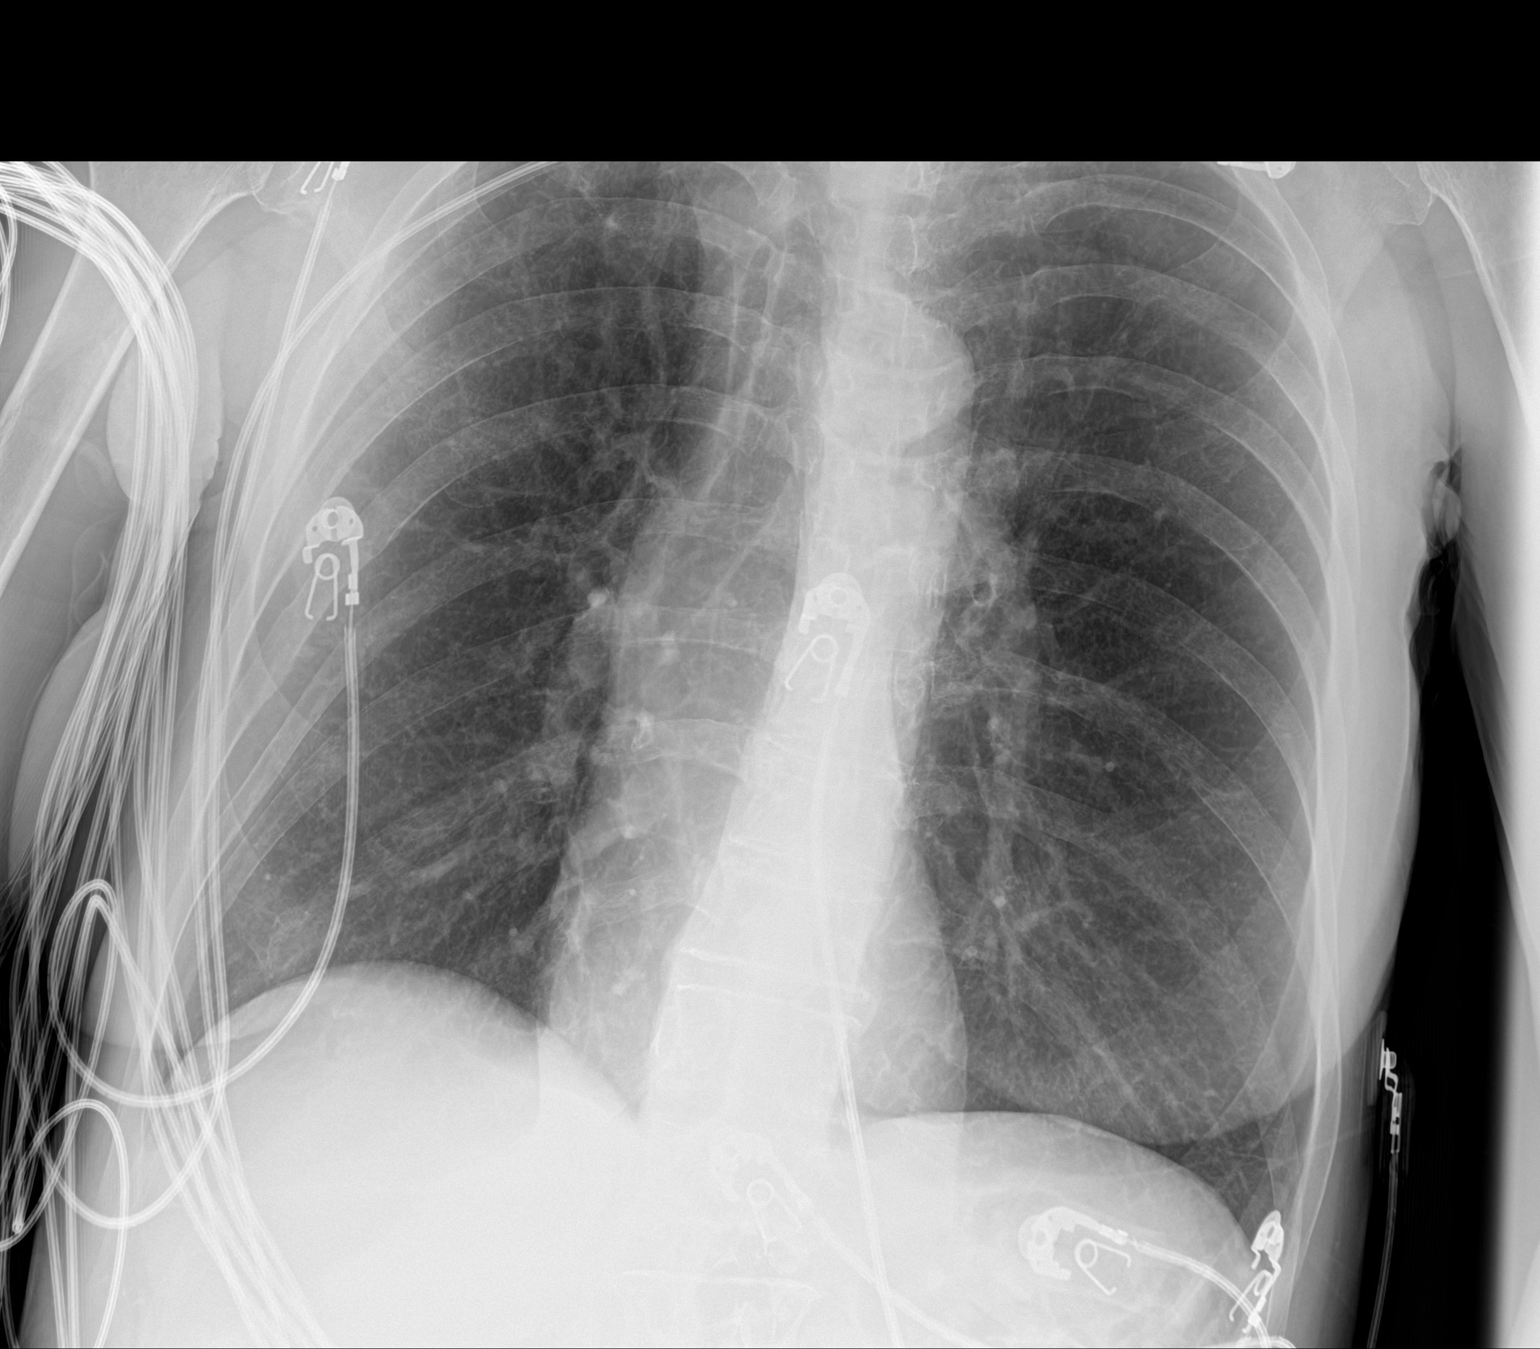

[2 of 2 positions shown; findings below may reference images not displayed]

FINDINGS: Cardiomediastinal silhouette is normal. Mildly calcified aortic
arch. Increased lung volumes and similar mild chronic interstitial
changes without pleural effusion or focal consolidation. No
pneumothorax. Old RIGHT clavicle fracture. Old RIGHT rib fractures.
Soft tissue planes are nonsuspicious.
IMPRESSION: COPD.

## 2019-03-31 ENCOUNTER — Telehealth: Payer: Self-pay

## 2019-03-31 NOTE — Telephone Encounter (Signed)
Xcopri 12.5mg  approved through 08/04/19 per [employer]

## 2019-05-20 ENCOUNTER — Ambulatory Visit (INDEPENDENT_AMBULATORY_CARE_PROVIDER_SITE_OTHER): Payer: Medicare HMO | Admitting: Neurology

## 2019-05-20 ENCOUNTER — Other Ambulatory Visit: Payer: Self-pay

## 2019-05-20 DIAGNOSIS — G243 Spasmodic torticollis: Secondary | ICD-10-CM | POA: Diagnosis not present

## 2019-05-20 MED ORDER — ONABOTULINUMTOXINA 100 UNITS IJ SOLR
200.0000 [IU] | Freq: Once | INTRAMUSCULAR | Status: AC
  Administered 2019-05-20: 15:00:00 200 [IU] via INTRAMUSCULAR

## 2019-05-20 NOTE — Procedures (Signed)
[facility]   Procedure Note Botox  Attending: Dr. Wells Guiles Tat  Preoperative Diagnosis(es): Cervical Dystonia  Result History  Onset of effect: unknown but states that "head no longer bouncing"  Last injections in April.  Missed f/u injections as mother died. Duration of Benefit: n/a  Adverse Effects: n/a   Consent obtained from: The patient Benefits discussed included, but were not limited to decreased muscle tightness, increased joint range of motion, and decreased pain.  Risk discussed included, but were not limited pain and discomfort, bleeding, bruising, excessive weakness, venous thrombosis, muscle atrophy and dysphagia.  A copy of the patient medication guide was given to the patient which explains the blackbox warning.  Patients identity and treatment sites confirmed Yes.  .  Details of Procedure: Skin was cleaned with alcohol.  A 30 gauge, 59mm  needle was introduced to the target muscle, except for posterior splenius where 27 gauge, 1.5 inch needle used.   Prior to injection, the needle plunger was aspirated to make sure the needle was not within a blood vessel.  There was no blood retrieved on aspiration.    Following is a summary of the muscles injected  And the amount of Botulinum toxin used:   Dilution 0.9% preservative free saline mixed with 100 u Botox type A to make 10 U per 0.1cc  Injections  Location Left  Right Units Number of sites        Sternocleidomastoid  60 60 1  Splenius Capitus, posterior approach 100  100 1  Splenius Capitus, lateral approach 40  40 1  Levator Scapulae      Trapezius            TOTAL UNITS:   200    Agent: Botulinum Type A ( Onobotulinum Toxin type A ).  2 vials of Botox were used, each containing 100 units and freshly diluted with 1 mL of sterile, non-preserved saline   Total injected (Units): 200  Total wasted (Units): 0   Pt tolerated procedure well without complications.   Reinjection is anticipated in 3  months. Clinical comment:  Will likely need to add R levator scapulae to next injections.  R ear approximating the R shoulder.   Clinical comment 2:  Pt reported not taking new seizure medication because wanted "levels checked" of other meds.  I told her to let follow Dr. Amparo Bristol instructions but I also let [name] know.

## 2019-06-02 ENCOUNTER — Telehealth: Payer: Self-pay | Admitting: Neurology

## 2019-06-02 NOTE — Telephone Encounter (Signed)
[facility] is not requesting any labs. Per the rep pt d/c medication on her own because she was waiting on lab results.  Not sure what lab results pt is referring to. Last labs drawn here were in Jan. Tried calling pt no answer and vmail was full.

## 2019-06-02 NOTE — Telephone Encounter (Signed)
Magda Paganini from [facility] called to be sure [name] is aware the patient has stopped taking her xcopri medication until she gets her recent lab results. She also has approval to get her medications from a local pharmacy.

## 2019-06-02 NOTE — Telephone Encounter (Signed)
Can you pls check what lab results she is waiting for and why this is needed before she continues to the [place]? How was she tolerating the very low dose of Xcopri? (before we send in refills). Thanks!

## 2019-06-07 NOTE — Telephone Encounter (Signed)
Pls f/u again with patient/daughter, thanks!

## 2019-06-07 NOTE — Telephone Encounter (Signed)
No answer again on cell and vmail full. Daughter, [name] answered and she is unaware that pt has stopped any medication. Home number [phone] given. Called number twice and sounded like someone answered then hung up.

## 2019-06-27 ENCOUNTER — Encounter: Payer: Self-pay | Admitting: Neurology

## 2019-06-27 ENCOUNTER — Other Ambulatory Visit: Payer: Self-pay

## 2019-06-27 ENCOUNTER — Telehealth (INDEPENDENT_AMBULATORY_CARE_PROVIDER_SITE_OTHER): Payer: Medicare HMO | Admitting: Neurology

## 2019-06-27 VITALS — Ht 67.0 in | Wt 130.0 lb

## 2019-06-27 DIAGNOSIS — Z8679 Personal history of other diseases of the circulatory system: Secondary | ICD-10-CM

## 2019-06-27 DIAGNOSIS — G40219 Localization-related (focal) (partial) symptomatic epilepsy and epileptic syndromes with complex partial seizures, intractable, without status epilepticus: Secondary | ICD-10-CM

## 2019-06-27 DIAGNOSIS — R29898 Other symptoms and signs involving the musculoskeletal system: Secondary | ICD-10-CM

## 2019-06-27 NOTE — Progress Notes (Signed)
Virtual Visit via Telephone Note The purpose of this virtual visit is to provide medical care while limiting exposure to the novel coronavirus.    Consent was obtained for phone visit:  Yes.   Answered questions that patient had about telehealth interaction:  Yes.   I discussed the limitations, risks, security and privacy concerns of performing an evaluation and management service by telephone. I also discussed with the patient that there may be a patient responsible charge related to this service. The patient expressed understanding and agreed to proceed.  Pt location: Home Physician Location: office Name of referring provider:  [name], MD I connected with .[name] at patients initiation/request on 06/27/2019 at 11:00 AM EST by telephone and verified that I am speaking with the correct person using two identifiers.  Pt MRN:  [id number] Pt DOB:  01/31/1956   History of Present Illness:  The patient had a telephone visit on 06/27/2019. 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 On her last visit, she continued to report seizures on 3 AEDs (although on low dose gabapentin 300mg  daily due to side effects). She has had labile levels of Dilantin, and has been on Dilantin 130mg  BID with no further hospitalizations for supratherapeutic levels. She is also on Vimpat 200mg  BID. We discussed starting low dose Cenobamate, since she has been sensitive to trials of several AEDs. She reports today that she has the medication but did not start it, stating she is worried about her Dilantin level and how it can potentially affect it. She reports 2 seizures in October, 3 so far in November where she had 2 last week leading to falls on the ground. She also reports left leg weakness even without a seizure, which sometimes also leads to a fall. She denies any headaches, dizziness, vision changes. She has drowsiness on the Lexapro and Gabapentin, so she takes the gabapentin in the morning and  Lexapro at night. Botox has helped with dystonia.    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 She had been seeing epileptologist [name], records were reviewed and will be summarized as follows. In the past, seizures would start with headache and burning sensation of the left arm, which may be followed by left arm jerking. Over time, she has not had any prior warning symptoms. She has had seizures where she is noted to have kicking and rocking movements of the pelvis with unresponsiveness. She was in the EMU in 10/2012 where 11 seizures were captured from the right posterior hemisphere/right inferior temporal region. Interictal EEG showed abundant right posterior temporal epileptiform discharges, frequent right anterior temporal epileptiform discharges, as well as occasional independent left anterior temporal epileptiform discharges. There was focal slowing over the right posterior hemisphere. MRI brain in 2014 showed right parietal/occipital encephalomalacia, right hippocampus smaller than left. PET scan in 2015 showed multiple areas of hypometabolism on the right, mostly corresponding to or adjacent to the structural abnormality. Ictal SPECT showed right hemisphere hypoperfusion. SISCOM showed right frontal region significant on the subtraction. MEG in 07/2013 showed spikes localizing to the right occipital, parietal, mesial temporal regions with a weak signal left mesial parietal. Concomitant EEG showed right posterior spikes. Functional mapping suggestive of left language lateralization. She has tried multiple AEDs in the past with various  side effects or ineffective, and has been taking Dilantin 160mg  in AM, 200mg  in PM and Vimpat 200mg  BID with seizures around once a  month. She was admitted to [facility] on 03/19/18 for left leg weakness and drowsiness. She was fishing with her boyfriend when she suddenly could not move her left leg and almost fell. She states that her boyfriend told her that she was shaking and had a seizure while they were fishing. She was brought to [facility] where Dilantin level was 33.8. She was adamant that she has been taking her medications as instructed and did not take more than prescribed, no new medications taken. She had an MRI brain without contrast which I personally reviewed, no acute changes seen. Large volume right parieto-occipital encephalomalacia was unchanged. Right hippocampus is smaller. She continued to report weakness and had an MRI cervical and lumbar spine which did not show any myelopathy, there was prominent left-sided facet hypertrophy at C4-5 with resultant moderate left C5 foraminal stenosis, degenerative disc osteophyte at C5-6 with severe right C6 foraminal stenosis, and mild spinal stenosis at C5-6 and C6-7, chronic borderline to mild multifactorial spinal stenosis at L2-3 through L4-5 with no convincing lateral recess or foraminal stenosis. She continued to report burning pain in her legs and was started on low dose gabapentin 300mg  qhs with no side effects. Dilantin was held in the hospital, she was discharged on a lower dose, but states that she has been taking her pre-hospital dose of 160mg  in AM, 200mg  in PM but has had 4 seizures since hospital discharge 2 weeks ago, which is unusual. She had 2 seizures in one day in the car with her mother-in-law, her mother-in-law had to pull over. The next day she had 2 at home, her daughter found her in the bathroom. She has not been sleeping well the past few days. She has a headache after the seizures. Aside from the convulsions, she has episodes where a feeling comes over her left side, from the arm down her leg, then she is weaker after. She denies any jerking. She is quite anxious in the  office today and perseverates on her symptoms. She states she "stays nervous." She reports taking prn Ativan in the past, but discontinued at [facility]. She repeatedly states her feet are numb and they "feel like they are grabbed." She feels gabapentin has helped some with this.   Prior AEDs: phenobarbital (ineffective, mood issues), Depakote (multiple trials, ineffective), Keppra (ineffective), Zonisamide (side effects on brief trial of higher dose than prescribed - 500mg  instead of 200mg ), Onfi (mouth sores), Tegretol (ineffective), Lamictal (nausea, dizziness, malaise), Felbatol (nausea, dizziness, malaise), Topamax (ineffective), Tigabine (tried per notes, details unclear), Neurontin (ineffective, nausea), Lyrica (leg pain reported on 25mg  daily, tingling), Briviact (drowsiness), Fycompa  Epilepsy Risk Factors:  Right parieto-occipital AVM s/p rupture and hemorrhage in 1988, s/p craniectomy. Otherwise she had a normal birth and early development.  There is no history of febrile convulsions, CNS infections such as meningitis/encephalitis, or family history of seizures.  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  EEG done 06/2018 showed right posterior temporal slowing, occasional epileptiform discharges over the right posterior temporal region, breach artifact in this region.  MRI cervical, thoracic, and lumbar spine done 07/2018 showed no convincing cervical or thoracic spinal cord abnormality. Mild degenerative cervical spinal stenosis at C5-6 and C6-7 with moderate or severe bilateral foraminal stenosis; no thoracic spinal or foraminal  stenosis; stable borderline to mild lumbar spinal stenosis MRI brain without contrast 05/2018 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    Observations/Objective:  Limited due  to nature of phone visit. Patient is awake, alert, able to answer questions without dysarthria or confusion noted.    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 She has tried multiple AEDs in the past with various side effects. She has had several admissions for Dilantin toxicity and has been on lower dose Dilantin 130mg  BID with no further issues. She is also on Vimpat 200mg  BID. She is on low dose gabapentin 300mg  daily for neuropathy (cannot tolerate higher doses). She continues to report 2-3 seizures a month, we discussed prior plan to start Cenobamate, concerns were addressed today, she has the starter pack at home and agrees to start 50mg  1/2 tab daily for a month, then 100mg  1/2 tab daily. We again discussed prognosis and consideration for surgery option if medication trials continue to fail. She also reports left leg weakness even in the absence of seizures, interval MRI brain with and without contrast and MRA head without contrast will be ordered. 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 She does not drive. Follow-up in 3 months, they know to call for any changes.    Follow Up Instructions:   -I discussed the assessment and treatment plan with the patient. The patient was provided an opportunity to ask questions and all were answered. The patient agreed with the plan and demonstrated an understanding of the instructions.   The patient was advised to call back or seek an in-person evaluation if the symptoms worsen or if the condition fails to improve as anticipated.    Total Time spent in visit with the patient was:  15:11 minutes, of which 100% of the time was spent in counseling and/or coordinating care on the above.   Pt understands and agrees with the plan of care outlined.     [name] Sprang,  MD

## 2019-07-13 ENCOUNTER — Telehealth: Payer: Self-pay | Admitting: Neurology

## 2019-07-13 NOTE — Telephone Encounter (Signed)
Patient's daughter called regarding Rosena having a Seizure yesterday and 2 today. Daughter would like to talk with the nurse. Please Call [phone]. Thank you

## 2019-07-14 ENCOUNTER — Other Ambulatory Visit: Payer: Self-pay

## 2019-07-14 DIAGNOSIS — G40219 Localization-related (focal) (partial) symptomatic epilepsy and epileptic syndromes with complex partial seizures, intractable, without status epilepticus: Secondary | ICD-10-CM

## 2019-07-14 MED ORDER — LORAZEPAM 1 MG PO TABS: ORAL_TABLET | ORAL | 5 refills | Status: DC

## 2019-07-14 NOTE — Telephone Encounter (Signed)
Agree with checking Dilantin level. Also pls confirm how she is taking the Xcopri [facility]), what dose is she on now? Thanks

## 2019-07-14 NOTE — Telephone Encounter (Signed)
Episodes 2x per year  Levels get thrown off, maybe signs of getting a cold?   No signs of infection right now. No fever. Denies stress, fatigue, alcohol, smoking. Daughter confirms that pt takes medications correctly.  Night before last fell with seizure in bathroom. Had one again last night. Pt does get back to normal self. Daughter questions if levels need to be checked?

## 2019-07-14 NOTE — Telephone Encounter (Signed)
Daughter called back again this pm stating that pt had another seizure. Wants to know if [name] has any other recommendations.  [name], The lab is closed today and pt will call tomorrow for Dilantin level. Is there anything that you would like for me to tell the pts daughter?

## 2019-07-14 NOTE — Telephone Encounter (Signed)
Left message for daughter to return call. 

## 2019-07-14 NOTE — Telephone Encounter (Signed)
Pt will come and have Dilantin level drawn today.  Daughter does not believe pt is taking the [place]. We will ask pt when she checks in at the front today for labs.

## 2019-07-14 NOTE — Telephone Encounter (Signed)
Spoke to daughter [name]. Patient had a seizure the other day, then 2 yesterday, and 2 today. No clear triggers, [name] states she is taking her medications, no recent changes in manufacturer, no clear infection. [name] is not sure if she has started the [place]. Discussed that prn Ativan 1mg  will be sent to her pharmacy. Give one dose now, may give another dose if she has another seizure. Do not take more than 2 in a day. If she does not return to baseline in between seizures, go to ER.

## 2019-07-15 ENCOUNTER — Other Ambulatory Visit: Payer: Medicare HMO

## 2019-07-15 ENCOUNTER — Other Ambulatory Visit: Payer: Self-pay

## 2019-07-15 DIAGNOSIS — G40219 Localization-related (focal) (partial) symptomatic epilepsy and epileptic syndromes with complex partial seizures, intractable, without status epilepticus: Secondary | ICD-10-CM | POA: Diagnosis not present

## 2019-07-16 LAB — PHENYTOIN LEVEL, TOTAL: Phenytoin (Dilantin), Serum: 12.3 ug/mL (ref 10.0–20.0)

## 2019-07-16 LAB — SPECIMEN STATUS REPORT

## 2019-07-18 ENCOUNTER — Telehealth: Payer: Self-pay

## 2019-07-18 NOTE — Telephone Encounter (Signed)
Pls confirm with patient, if able, re: Xcopri, and if she is having any side effects. Thanks!

## 2019-07-18 NOTE — Telephone Encounter (Signed)
-----   Message from [name], MD sent at 07/18/2019  9:10 AM EST ----- Pls let Hadasa/daughter know that the Dilantin level is within range, not high and not low. Has she started the [place]? Did the Ativan last weekend help quiet down the seizures? Thanks

## 2019-07-18 NOTE — Telephone Encounter (Signed)
Attempted to reach patient, no answer and unable to leave a message.

## 2019-07-18 NOTE — Telephone Encounter (Signed)
Informed daughter of lab results.  One dose of ativan was given and no seizure since.  Patients daughter is not sure if patient has started [place].

## 2019-07-20 NOTE — Telephone Encounter (Signed)
Spoke with patient she says she is taking the Xcopri in the afternoon with no side effects at this time.

## 2019-07-20 NOTE — Telephone Encounter (Signed)
Noted, thanks!

## 2019-07-21 ENCOUNTER — Other Ambulatory Visit: Payer: Medicare HMO

## 2019-08-08 DIAGNOSIS — R569 Unspecified convulsions: Secondary | ICD-10-CM | POA: Diagnosis not present

## 2019-08-08 DIAGNOSIS — G43909 Migraine, unspecified, not intractable, without status migrainosus: Secondary | ICD-10-CM | POA: Diagnosis not present

## 2019-08-11 ENCOUNTER — Other Ambulatory Visit: Payer: Self-pay

## 2019-08-11 DIAGNOSIS — R569 Unspecified convulsions: Secondary | ICD-10-CM | POA: Diagnosis not present

## 2019-08-12 LAB — PHENYTOIN LEVEL, TOTAL: Phenytoin (Dilantin), Serum: 12.4 ug/mL (ref 10.0–20.0)

## 2019-08-17 ENCOUNTER — Encounter: Payer: Self-pay | Admitting: *Deleted

## 2019-08-17 NOTE — Progress Notes (Addendum)
[name], [name] 08/17/2019 [id number] Benefit Verification 05/20/2019 08/12/2019 [facility] Wells Guiles Tat In Progress None   Received from [facility] that she has approval already on file  10/2018-08/03/2020 from Medicare advantage  Buy and bill or Specialty pharmacy Sending fax to scan

## 2019-08-26 ENCOUNTER — Emergency Department (HOSPITAL_COMMUNITY): Payer: Medicare HMO

## 2019-08-26 ENCOUNTER — Ambulatory Visit: Payer: Medicare HMO | Admitting: Neurology

## 2019-08-26 ENCOUNTER — Encounter (HOSPITAL_COMMUNITY): Payer: Self-pay | Admitting: Internal Medicine

## 2019-08-26 ENCOUNTER — Other Ambulatory Visit: Payer: Self-pay

## 2019-08-26 ENCOUNTER — Observation Stay (HOSPITAL_COMMUNITY): Payer: Medicare HMO

## 2019-08-26 ENCOUNTER — Inpatient Hospital Stay (HOSPITAL_COMMUNITY)
Admission: EM | Admit: 2019-08-26 | Discharge: 2019-08-29 | DRG: 091 | Disposition: A | Discharge status: Home / Self Care | Payer: Medicare HMO | Attending: Family Medicine | Admitting: Family Medicine

## 2019-08-26 ENCOUNTER — Telehealth: Payer: Self-pay

## 2019-08-26 DIAGNOSIS — J449 Chronic obstructive pulmonary disease, unspecified: Secondary | ICD-10-CM | POA: Diagnosis present

## 2019-08-26 DIAGNOSIS — G40909 Epilepsy, unspecified, not intractable, without status epilepticus: Secondary | ICD-10-CM | POA: Diagnosis not present

## 2019-08-26 DIAGNOSIS — G40919 Epilepsy, unspecified, intractable, without status epilepticus: Secondary | ICD-10-CM | POA: Diagnosis not present

## 2019-08-26 DIAGNOSIS — Z8249 Family history of ischemic heart disease and other diseases of the circulatory system: Secondary | ICD-10-CM | POA: Diagnosis not present

## 2019-08-26 DIAGNOSIS — Z8619 Personal history of other infectious and parasitic diseases: Secondary | ICD-10-CM | POA: Diagnosis not present

## 2019-08-26 DIAGNOSIS — R0902 Hypoxemia: Secondary | ICD-10-CM | POA: Diagnosis not present

## 2019-08-26 DIAGNOSIS — M549 Dorsalgia, unspecified: Secondary | ICD-10-CM | POA: Diagnosis present

## 2019-08-26 DIAGNOSIS — T50995A Adverse effect of other drugs, medicaments and biological substances, initial encounter: Secondary | ICD-10-CM | POA: Diagnosis present

## 2019-08-26 DIAGNOSIS — G4489 Other headache syndrome: Secondary | ICD-10-CM | POA: Diagnosis not present

## 2019-08-26 DIAGNOSIS — R7889 Finding of other specified substances, not normally found in blood: Secondary | ICD-10-CM | POA: Diagnosis present

## 2019-08-26 DIAGNOSIS — Z8673 Personal history of transient ischemic attack (TIA), and cerebral infarction without residual deficits: Secondary | ICD-10-CM

## 2019-08-26 DIAGNOSIS — T420X5A Adverse effect of hydantoin derivatives, initial encounter: Secondary | ICD-10-CM | POA: Diagnosis present

## 2019-08-26 DIAGNOSIS — G629 Polyneuropathy, unspecified: Secondary | ICD-10-CM | POA: Diagnosis present

## 2019-08-26 DIAGNOSIS — Q282 Arteriovenous malformation of cerebral vessels: Secondary | ICD-10-CM | POA: Diagnosis not present

## 2019-08-26 DIAGNOSIS — R251 Tremor, unspecified: Secondary | ICD-10-CM | POA: Diagnosis present

## 2019-08-26 DIAGNOSIS — G40019 Localization-related (focal) (partial) idiopathic epilepsy and epileptic syndromes with seizures of localized onset, intractable, without status epilepticus: Secondary | ICD-10-CM | POA: Diagnosis not present

## 2019-08-26 DIAGNOSIS — G9389 Other specified disorders of brain: Secondary | ICD-10-CM | POA: Diagnosis present

## 2019-08-26 DIAGNOSIS — T83518A Infection and inflammatory reaction due to other urinary catheter, initial encounter: Secondary | ICD-10-CM | POA: Diagnosis not present

## 2019-08-26 DIAGNOSIS — R29818 Other symptoms and signs involving the nervous system: Secondary | ICD-10-CM | POA: Diagnosis not present

## 2019-08-26 DIAGNOSIS — A419 Sepsis, unspecified organism: Secondary | ICD-10-CM | POA: Diagnosis present

## 2019-08-26 DIAGNOSIS — R569 Unspecified convulsions: Secondary | ICD-10-CM | POA: Diagnosis not present

## 2019-08-26 DIAGNOSIS — T83511A Infection and inflammatory reaction due to indwelling urethral catheter, initial encounter: Secondary | ICD-10-CM | POA: Diagnosis not present

## 2019-08-26 DIAGNOSIS — T420X1A Poisoning by hydantoin derivatives, accidental (unintentional), initial encounter: Secondary | ICD-10-CM

## 2019-08-26 DIAGNOSIS — R0602 Shortness of breath: Secondary | ICD-10-CM | POA: Diagnosis not present

## 2019-08-26 DIAGNOSIS — R270 Ataxia, unspecified: Secondary | ICD-10-CM

## 2019-08-26 DIAGNOSIS — K219 Gastro-esophageal reflux disease without esophagitis: Secondary | ICD-10-CM | POA: Diagnosis present

## 2019-08-26 DIAGNOSIS — Z87891 Personal history of nicotine dependence: Secondary | ICD-10-CM | POA: Diagnosis not present

## 2019-08-26 DIAGNOSIS — Z800 Family history of malignant neoplasm of digestive organs: Secondary | ICD-10-CM | POA: Diagnosis not present

## 2019-08-26 DIAGNOSIS — N390 Urinary tract infection, site not specified: Secondary | ICD-10-CM | POA: Diagnosis not present

## 2019-08-26 DIAGNOSIS — Z79899 Other long term (current) drug therapy: Secondary | ICD-10-CM

## 2019-08-26 DIAGNOSIS — Z8711 Personal history of peptic ulcer disease: Secondary | ICD-10-CM | POA: Diagnosis not present

## 2019-08-26 DIAGNOSIS — Z20822 Contact with and (suspected) exposure to covid-19: Secondary | ICD-10-CM | POA: Diagnosis not present

## 2019-08-26 DIAGNOSIS — R0689 Other abnormalities of breathing: Secondary | ICD-10-CM | POA: Diagnosis not present

## 2019-08-26 DIAGNOSIS — R531 Weakness: Secondary | ICD-10-CM | POA: Diagnosis present

## 2019-08-26 DIAGNOSIS — I959 Hypotension, unspecified: Secondary | ICD-10-CM | POA: Diagnosis not present

## 2019-08-26 DIAGNOSIS — Z802 Family history of malignant neoplasm of other respiratory and intrathoracic organs: Secondary | ICD-10-CM

## 2019-08-26 DIAGNOSIS — G8929 Other chronic pain: Secondary | ICD-10-CM | POA: Diagnosis present

## 2019-08-26 DIAGNOSIS — R892 Abnormal level of other drugs, medicaments and biological substances in specimens from other organs, systems and tissues: Secondary | ICD-10-CM | POA: Diagnosis not present

## 2019-08-26 DIAGNOSIS — Z1611 Resistance to penicillins: Secondary | ICD-10-CM | POA: Diagnosis present

## 2019-08-26 DIAGNOSIS — B961 Klebsiella pneumoniae [K. pneumoniae] as the cause of diseases classified elsewhere: Secondary | ICD-10-CM | POA: Diagnosis present

## 2019-08-26 DIAGNOSIS — R29898 Other symptoms and signs involving the musculoskeletal system: Secondary | ICD-10-CM | POA: Diagnosis not present

## 2019-08-26 HISTORY — DX: Ataxia, unspecified: R27.0

## 2019-08-26 LAB — CBC WITH DIFFERENTIAL/PLATELET
Abs Immature Granulocytes: 0.01 10*3/uL (ref 0.00–0.07)
Basophils Absolute: 0 10*3/uL (ref 0.0–0.1)
Basophils Relative: 1 %
Eosinophils Absolute: 0 10*3/uL (ref 0.0–0.5)
Eosinophils Relative: 1 %
HCT: 43.3 % (ref 36.0–46.0)
Hemoglobin: 14.1 g/dL (ref 12.0–15.0)
Immature Granulocytes: 0 %
Lymphocytes Relative: 37 %
Lymphs Abs: 1.2 10*3/uL (ref 0.7–4.0)
MCH: 33.8 pg (ref 26.0–34.0)
MCHC: 32.6 g/dL (ref 30.0–36.0)
MCV: 103.8 fL — ABNORMAL HIGH (ref 80.0–100.0)
Monocytes Absolute: 0.3 10*3/uL (ref 0.1–1.0)
Monocytes Relative: 8 %
Neutro Abs: 1.8 10*3/uL (ref 1.7–7.7)
Neutrophils Relative %: 53 %
Platelets: 124 10*3/uL — ABNORMAL LOW (ref 150–400)
RBC: 4.17 MIL/uL (ref 3.87–5.11)
RDW: 12.3 % (ref 11.5–15.5)
WBC: 3.4 10*3/uL — ABNORMAL LOW (ref 4.0–10.5)
nRBC: 0 % (ref 0.0–0.2)

## 2019-08-26 LAB — COMPREHENSIVE METABOLIC PANEL
ALT: 12 U/L (ref 0–44)
AST: 17 U/L (ref 15–41)
Albumin: 4.3 g/dL (ref 3.5–5.0)
Alkaline Phosphatase: 51 U/L (ref 38–126)
Anion gap: 10 (ref 5–15)
BUN: 5 mg/dL — ABNORMAL LOW (ref 8–23)
CO2: 26 mmol/L (ref 22–32)
Calcium: 9.3 mg/dL (ref 8.9–10.3)
Chloride: 109 mmol/L (ref 98–111)
Creatinine, Ser: 0.97 mg/dL (ref 0.44–1.00)
GFR calc Af Amer: >60 mL/min (ref >60)
GFR calc non Af Amer: >60 mL/min (ref >60)
Glucose, Bld: 94 mg/dL (ref 70–99)
Potassium: 3.4 mmol/L — ABNORMAL LOW (ref 3.5–5.1)
Sodium: 145 mmol/L (ref 135–145)
Total Bilirubin: 0.6 mg/dL (ref 0.3–1.2)
Total Protein: 6.9 g/dL (ref 6.5–8.1)

## 2019-08-26 LAB — TROPONIN I (HIGH SENSITIVITY)
Troponin I (High Sensitivity): <2 ng/L (ref <18)
Troponin I (High Sensitivity): 2 ng/L (ref <18)

## 2019-08-26 LAB — RAPID URINE DRUG SCREEN, HOSP PERFORMED
Amphetamines: NOT DETECTED
Barbiturates: NOT DETECTED
Benzodiazepines: NOT DETECTED
Cocaine: NOT DETECTED
Opiates: NOT DETECTED
Tetrahydrocannabinol: NOT DETECTED

## 2019-08-26 LAB — URINALYSIS, ROUTINE W REFLEX MICROSCOPIC
Bilirubin Urine: NEGATIVE
Glucose, UA: NEGATIVE mg/dL
Hgb urine dipstick: NEGATIVE
Ketones, ur: NEGATIVE mg/dL
Nitrite: POSITIVE — AB
Protein, ur: NEGATIVE mg/dL
Specific Gravity, Urine: 1.002 — ABNORMAL LOW (ref 1.005–1.030)
pH: 7 (ref 5.0–8.0)

## 2019-08-26 LAB — HIV ANTIBODY (ROUTINE TESTING W REFLEX): HIV Screen 4th Generation wRfx: NONREACTIVE

## 2019-08-26 LAB — POC SARS CORONAVIRUS 2 AG -  ED: SARS Coronavirus 2 Ag: NEGATIVE

## 2019-08-26 LAB — SALICYLATE LEVEL: Salicylate Lvl: <7 mg/dL — ABNORMAL LOW (ref 7.0–30.0)

## 2019-08-26 LAB — ACETAMINOPHEN LEVEL: Acetaminophen (Tylenol), Serum: <10 ug/mL — ABNORMAL LOW (ref 10–30)

## 2019-08-26 LAB — BRAIN NATRIURETIC PEPTIDE: B Natriuretic Peptide: 49.6 pg/mL (ref 0.0–100.0)

## 2019-08-26 LAB — ETHANOL: Alcohol, Ethyl (B): <10 mg/dL (ref <10)

## 2019-08-26 LAB — PHENYTOIN LEVEL, TOTAL: Phenytoin Lvl: 22.2 ug/mL — ABNORMAL HIGH (ref 10.0–20.0)

## 2019-08-26 MED ORDER — ALBUTEROL SULFATE (2.5 MG/3ML) 0.083% IN NEBU: 2.5000 mg | INHALATION_SOLUTION | Freq: Four times a day (QID) | RESPIRATORY_TRACT | Status: DC | PRN

## 2019-08-26 MED ORDER — LACOSAMIDE 200 MG PO TABS
200.0000 mg | ORAL_TABLET | Freq: Two times a day (BID) | ORAL | Status: DC
  Administered 2019-08-26 – 2019-08-29 (×7): 200 mg via ORAL
  Filled 2019-08-26 (×4): qty 1
  Filled 2019-08-26: qty 4
  Filled 2019-08-26 (×2): qty 1

## 2019-08-26 MED ORDER — LORAZEPAM 2 MG/ML IJ SOLN: 2.0000 mg | INTRAMUSCULAR | Status: DC | PRN

## 2019-08-26 MED ORDER — IOHEXOL 350 MG/ML SOLN
80.0000 mL | Freq: Once | INTRAVENOUS | Status: AC | PRN
  Administered 2019-08-26: 08:00:00 70 mL via INTRAVENOUS

## 2019-08-26 MED ORDER — SODIUM CHLORIDE 0.9 % IV SOLN
1.0000 g | INTRAVENOUS | Status: DC
  Administered 2019-08-26 – 2019-08-28 (×3): 1 g via INTRAVENOUS
  Filled 2019-08-26: qty 10
  Filled 2019-08-26: qty 1
  Filled 2019-08-26: qty 10
  Filled 2019-08-26: qty 1

## 2019-08-26 MED ORDER — SODIUM CHLORIDE 0.9% FLUSH
3.0000 mL | Freq: Two times a day (BID) | INTRAVENOUS | Status: DC
  Administered 2019-08-26 – 2019-08-29 (×5): 3 mL via INTRAVENOUS

## 2019-08-26 MED ORDER — GABAPENTIN 300 MG PO CAPS
300.0000 mg | ORAL_CAPSULE | Freq: Every day | ORAL | Status: DC
  Administered 2019-08-26 – 2019-08-28 (×3): 300 mg via ORAL
  Filled 2019-08-26 (×3): qty 1

## 2019-08-26 MED ORDER — SODIUM CHLORIDE 0.9 % IV BOLUS
1000.0000 mL | Freq: Once | INTRAVENOUS | Status: AC
  Administered 2019-08-26: 04:00:00 1000 mL via INTRAVENOUS

## 2019-08-26 MED ORDER — POTASSIUM CHLORIDE CRYS ER 20 MEQ PO TBCR
20.0000 meq | EXTENDED_RELEASE_TABLET | ORAL | Status: AC
  Administered 2019-08-26: 11:00:00 20 meq via ORAL
  Filled 2019-08-26: qty 1

## 2019-08-26 MED ORDER — ACETAMINOPHEN 325 MG PO TABS
650.0000 mg | ORAL_TABLET | Freq: Four times a day (QID) | ORAL | Status: DC | PRN
  Administered 2019-08-26 – 2019-08-28 (×3): 650 mg via ORAL
  Filled 2019-08-26 (×3): qty 2

## 2019-08-26 MED ORDER — ENOXAPARIN SODIUM 40 MG/0.4ML ~~LOC~~ SOLN
40.0000 mg | SUBCUTANEOUS | Status: DC
  Administered 2019-08-26 – 2019-08-28 (×3): 40 mg via SUBCUTANEOUS
  Filled 2019-08-26 (×3): qty 0.4

## 2019-08-26 MED ORDER — ACETAMINOPHEN 650 MG RE SUPP: 650.0000 mg | Freq: Four times a day (QID) | RECTAL | Status: DC | PRN

## 2019-08-26 MED ORDER — ESCITALOPRAM OXALATE 10 MG PO TABS
10.0000 mg | ORAL_TABLET | Freq: Every day | ORAL | Status: DC
  Administered 2019-08-26 – 2019-08-29 (×4): 10 mg via ORAL
  Filled 2019-08-26 (×4): qty 1

## 2019-08-26 MED ORDER — ALUM & MAG HYDROXIDE-SIMETH 200-200-20 MG/5ML PO SUSP
30.0000 mL | Freq: Four times a day (QID) | ORAL | Status: DC | PRN
  Administered 2019-08-26: 17:00:00 30 mL via ORAL
  Filled 2019-08-26: qty 30

## 2019-08-26 MED ORDER — PHENYTOIN SODIUM EXTENDED 100 MG PO CAPS
100.0000 mg | ORAL_CAPSULE | Freq: Two times a day (BID) | ORAL | Status: DC
  Administered 2019-08-26 – 2019-08-29 (×7): 100 mg via ORAL
  Filled 2019-08-26 (×7): qty 1

## 2019-08-26 NOTE — ED Notes (Signed)
Pt ambulated around the room with very unsteady gait. This tech had to hold onto the pt's gown to stop the pt from falling over.

## 2019-08-26 NOTE — ED Notes (Signed)
Pt transported to CT ?

## 2019-08-26 NOTE — ED Provider Notes (Signed)
[name] EMERGENCY DEPARTMENT Provider Note   CSN: [id number] Arrival date & time:        History Chief Complaint  Patient presents with  . Tremors    [name] is a 64 y.o. female.  Patient is a 64 year old female with extensive past medical history including cerebral hemorrhage with resultant seizure disorder, COPD, neuropathy, gastric ulcers.  She presents today for evaluation of weakness and tremor.  Patient states she woke from sleep and was so weak and short of breath she could not walk to the bathroom.  She apparently began having tremors to her arms and legs.  She was given Benadryl by EMS and tremor seems to have resolved.  She continues with feeling weak and short of breath.  Patient denies any fevers, chills, or chest pain.  She denies any cough.  She denies any leg swelling.  The history is provided by the patient.       Past Medical History:  Diagnosis Date  . Asthma   . Cerebral hemorrhage ([place]) 1989  . Chronic back pain   . COPD (chronic obstructive pulmonary disease) ([place])   . DDD (degenerative disc disease) 06/12/2013  . DVT (deep venous thrombosis) ([place])    "she's had several since 1990"  . GERD (gastroesophageal reflux disease)   . Headache    "usually around time when she's had a seizure"  . History of blood transfusion    "when she was a baby"  . History of stomach ulcers   . Kidney stones   . Neuropathy   . Seizures ([place])    "because of her brain surgery"  . Stroke [facility]) [phone]   family denies residual on 11/09/2014  . Tunnel vision    "since brain OR"    Patient Active Problem List   Diagnosis Date Noted  . Altered mental status   . Weakness of both lower extremities   . Generalized weakness 05/25/2018  . History of CVA (cerebrovascular accident)   . Pain   . Chronic back pain   . Neuropathy   . Orthostasis   . Tobacco abuse   . Left-sided weakness 03/19/2018  . Atypical chest pain 02/17/2017  . Localized  swelling of lower extremity 02/17/2017  . GERD (gastroesophageal reflux disease) 02/17/2017  . Costochondritis 02/17/2017  . Seizures ([place]) 11/02/2015  . Gait disturbance 11/02/2015  . Right clavicle fracture 11/02/2015  . Infection of urinary tract 11/02/2015  . Chest pain 03/31/2015  . Acute on chronic respiratory failure with hypoxia ([place]) 03/31/2015  . SOB (shortness of breath) 03/31/2015  . Anxiety state 03/31/2015  . Malnutrition of moderate degree ([place]) 03/31/2015  . COPD with exacerbation ([place]) 03/30/2015  . Hypoxia 01/18/2015  . Chronic obstructive pulmonary disease with acute exacerbation ([place])   . Fever 01/17/2015  . Cough 01/17/2015  . Sepsis secondary to UTI ([place]) 01/17/2015  . COPD (chronic obstructive pulmonary disease) ([place])   . Unintentional weight loss 01/11/2015  . Asthma 05/15/2014  . Seizure ([place]) 05/15/2014  . Protein calorie malnutrition ([place]) 05/15/2014  . DVT of leg (deep venous thrombosis) ([place]) 06/19/2013  . DDD (degenerative disc disease) 06/12/2013  . Paresthesias 06/12/2013  . Tremor 06/09/2013  . Thrombocytopenia- chronic 06/29/2012  . Noncompliance with medication treatment due to underuse of medication 06/29/2012  . Stress-personal 06/29/2012  . Recurrent seizures ([place]) 06/29/2012  . Tobacco dependence 06/28/2012  . Skull fracture ([place]) 02/28/2012  . Dilantin toxicity 02/28/2012  . Seizure disorder ([place]) 02/28/2012  .  History of Intracranial bleed 02/28/2012    Past Surgical History:  Procedure Laterality Date  . [place]   craniotomy with hematoma evacuation  . CESAREAN SECTION  1979; 1987; 1989  . HEMORRHOID SURGERY    . TUBAL LIGATION  1990's     OB History   No obstetric history on file.     Family History  Problem Relation Age of Onset  . Heart attack Mother        4 MIs, started in her 41s, also vaginal cancer and colon cancer  . Cancer Mother   . Heart attack Father        Died suddenly age 4 - autopsy  revealed heart attack her patient  . Cancer Brother        Sinus cancer    Social History   Tobacco Use  . Smoking status: Former Smoker    Packs/day: 0.50    Years: 20.00    Pack years: 10.00    Types: Cigarettes    Quit date: 10/26/2017    Years since quitting: 1.8  . Smokeless tobacco: Never Used  . Tobacco comment: Previously smoked 2 ppd, down to 4 cigs/day  Substance Use Topics  . Alcohol use: No  . Drug use: No    Home Medications Prior to Admission medications   Medication Sig Start Date End Date Taking? Authorizing Provider  cholecalciferol (VITAMIN D3) 25 MCG (1000 UT) tablet Take 1,000 Units by mouth 2 (two) times daily.    [provider]  Cyanocobalamin (VITAMIN B-12) 1000 MCG TABS Take 1,000 mcg by mouth daily. 07/19/18   Thurnell Lose, MD  escitalopram (LEXAPRO) 10 MG tablet Take 1 tablet (10 mg total) by mouth daily. 03/10/19   [name], MD  gabapentin (NEURONTIN) 300 MG capsule Take 1 capsule every night 03/10/19   [name], MD  lacosamide (VIMPAT) 200 MG TABS tablet Take 1 tablet (200 mg total) by mouth 2 (two) times daily. 03/10/19   [name], MD  LORazepam (ATIVAN) 1 MG tablet Take 1 tablet as needed for seizure. Do not take more than 2 in 24 hours. 07/14/19   [name], MD  phenytoin (DILANTIN) 100 MG ER capsule Take 1 capsule twice a day (take with 30mg  capsule twice a day, total of 130mg  twice a day) 03/10/19   [name], MD  phenytoin (DILANTIN) 30 MG ER capsule Take 1 capsule (30 mg total) by mouth 2 (two) times daily. Take with 100 mg tablets 03/10/19   [name], MD    Allergies    Zonisamide, Codeine, Keppra [levetiracetam], Pregabalin, and Olive oil  Review of Systems   Review of Systems  All other systems reviewed and are negative.   Physical Exam Updated Vital Signs BP 120/78 (BP Location: Right Arm)   Pulse 78   Temp 98.3 F (36.8 C) (Oral)   Resp (!) 25   SpO2 97%   Physical Exam Vitals and  nursing note reviewed.  Constitutional:      General: She is not in acute distress.    Appearance: She is well-developed. She is not diaphoretic.  HENT:     Head: Normocephalic and atraumatic.  Cardiovascular:     Rate and Rhythm: Normal rate and regular rhythm.     Heart sounds: No murmur. No friction rub. No gallop.   Pulmonary:     Effort: Pulmonary effort is normal. No respiratory distress.     Breath  sounds: Normal breath sounds. No wheezing.  Abdominal:     General: Bowel sounds are normal. There is no distension.     Palpations: Abdomen is soft.     Tenderness: There is no abdominal tenderness.  Musculoskeletal:        General: Normal range of motion.     Cervical back: Normal range of motion and neck supple.  Skin:    General: Skin is warm and dry.  Neurological:     Mental Status: She is alert and oriented to person, place, and time.     Cranial Nerves: No cranial nerve deficit.     Comments: Patient with generalized weakness on exam throughout all 4 extremities.  Weakness is diffuse and nonfocal.     ED Results / Procedures / Treatments   Labs (all labs ordered are listed, but only abnormal results are displayed) Labs Reviewed  ACETAMINOPHEN LEVEL  COMPREHENSIVE METABOLIC PANEL  ETHANOL  CBC WITH DIFFERENTIAL/PLATELET  SALICYLATE LEVEL  BRAIN NATRIURETIC PEPTIDE  URINALYSIS, ROUTINE W REFLEX MICROSCOPIC  RAPID URINE DRUG SCREEN, HOSP PERFORMED  PHENYTOIN LEVEL, TOTAL  TROPONIN I (HIGH SENSITIVITY)    EKG EKG Interpretation  Date/Time:  Friday August 26 2019 03:02:25 EST Ventricular Rate:  79 PR Interval:    QRS Duration: 124 QT Interval:  376 QTC Calculation: 431 R Axis:   92 Text Interpretation: Sinus rhythm Borderline right axis Confirmed by Veryl Speak [phone]) on 08/26/2019 3:05:29 AM   Radiology No results found.  Procedures Procedures (including critical care time)  Medications Ordered in ED Medications  sodium chloride 0.9 % bolus  1,000 mL (has no administration in time range)    ED Course  I have reviewed the triage vital signs and the nursing notes.  Pertinent labs & imaging results that were available during my care of the patient were reviewed by me and considered in my medical decision making (see chart for details).    MDM Rules/Calculators/A&P  Patient presenting here with extensive medical history as described in the HPI presenting with complaints of weakness and instability with ambulation.  She describes shortness of breath as well that began abruptly upon waking from sleep.  On exam, patient with no focal deficits.  Work-up shows no significant abnormality thus far.  Patient will go for a CT angio of the chest to rule out pulmonary embolism.  Care will be signed out to Dr. Tomi Bamberger awaiting results of this study.  He will determine the final disposition.  Final Clinical Impression(s) / ED Diagnoses Final diagnoses:  None    Rx / DC Orders ED Discharge Orders    None       Veryl Speak, MD 08/26/19 2352

## 2019-08-26 NOTE — ED Triage Notes (Signed)
Pt arrived via EMS with tremors that began a few hours ago. Family reported the pt has a history of grand mal seizures and the pt had one around 12 PM on 1/21 but didn't call EMS. Family called EMS for continuing tremors and pt was having trouble breathing that resolved before EMS arrived. Family also stated to EMS that pt was started on Lexapro 2 weeks ago. EMS administered 50MG  of IV benadryl which resolved the tremors but they returned shortly after.

## 2019-08-26 NOTE — Consult Note (Addendum)
Neurology Consultation Reason for Consult: ataxia Referring Physician: Dr [name]  CC: Tremors  History is obtained from: Patient and chart review  HPI: [name] is a 64 y.o. 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  Patient states she had a generalized tonic-clonic seizure sometime yesterday witnessed by her family member.  Unable to provide any further details regarding the seizure.  States she mostly slept the rest of the day.  This morning when she woke up she started feeling very ataxic and unable to walk to the bathroom.  Patient does report that she started taking cenobamate last week.   I reviewed Dr. Amparo [name] note from 06/27/2019 summarized as follows.  Patient has had seizures described as aura of headache and burning sensation left arm which may be followed by left arm jerking and eventually progresses to generalized tonic-clonic seizures.  Unclear frequency of seizures.  Prior EMU admission in 10/2012 showed 11 seizures arising from right posterior hemisphere/right anterior temporal region as well as abundant right posterior temporal epileptiform discharges, frequent right anterior temporal epileptic form discharges as well as occasional independent left anterior temporal epileptiform discharges.  MRI brain in 2014 showed right parieto-occipital encephalomalacia, right hippocampus smaller than left.  PET scan 2015 showed multiple areas of hypermetabolic them on the right mostly corresponding to or adjacent to the structural abnormality.  Ictal SPECT showed right hemisphere hypoperfusion  SISCOM showed right frontal region significant on subtraction.  [name] 07/2013 showed spikes localizing to the right occipital, parietal, mesial temporal regions with a weak signal in left  mesial temporal.  Concomitant EEG showed right posterior spikes.  Functional mapping suggestive of left language lateralization.  Prior AEDs:phenobarbital (ineffective, mood issues), Depakote (multiple trials, ineffective), Keppra (ineffective), Zonisamide (side effects on brief trial of higher dose than prescribed - 500mg  instead of 200mg ), Onfi (mouth sores), Tegretol (ineffective), Lamictal (nausea, dizziness, malaise), Felbatol (nausea, dizziness, malaise), Topamax (ineffective), Tigabine (tried per notes, details unclear), Neurontin (ineffective, nausea), Lyrica (leg pain reported on 25mg  daily, tingling), Briviact (drowsiness), Fycompa  Epilepsy Risk Factors:Right parieto-occipital AVM s/p rupture and hemorrhage in 1988, s/p craniectomy. Otherwise shehad a normal birth and early development. There is no history of febrile convulsions, CNS infections such as meningitis/encephalitis, or family history of seizures.  ROS: A 14 point ROS was performed and is negative except as noted in the HPI.    Past Medical History:  Diagnosis Date  . Asthma   . Cerebral hemorrhage ([place]) 1989  . Chronic back pain   . COPD (chronic obstructive pulmonary disease) ([place])   . DDD (degenerative disc disease) 06/12/2013  . DVT (deep venous thrombosis) ([place])    "she's had several since 1990"  . GERD (gastroesophageal reflux disease)   . Headache    "usually around time when she's had a seizure"  . History of blood transfusion    "when she was a baby"  . History of stomach ulcers   . Kidney stones   . Neuropathy   . Seizures ([place])    "because of her brain surgery"  . Stroke [facility]) [phone]   family denies residual on 11/09/2014  . Tunnel vision    "since brain OR"    Family History  Problem Relation Age of Onset  . Heart attack Mother        4 MIs, started  in her 9s, also vaginal cancer and colon cancer  . Cancer Mother   . Heart attack Father        Died suddenly age 60 - autopsy revealed  heart attack her patient  . Cancer Brother        Sinus cancer   Social History:  reports that she quit smoking about 22 months ago. Her smoking use included cigarettes. She has a 10.00 pack-year smoking history. She has never used smokeless tobacco. She reports that she does not drink alcohol or use drugs.   Exam: Current vital signs: BP (!) 104/57   Pulse 74   Temp 98.3 F (36.8 C) (Oral)   Resp 16   SpO2 96%  Vital signs in last 24 hours: Temp:  [98.3 F (36.8 C)] 98.3 F (36.8 C) (01/22 0301) Pulse Rate:  [61-81] 74 (01/22 0915) Resp:  [13-25] 16 (01/22 1030) BP: (93-124)/(50-78) 104/57 (01/22 1030) SpO2:  [91 %-97 %] 96 % (01/22 0915)   Physical Exam  Constitutional: Appears well-developed and well-nourished.  Psych: Affect appropriate to situation Eyes: No scleral injection HENT: No OP obstrucion Head: Normocephalic.  Cardiovascular: Normal rate and regular rhythm.  Respiratory: Effort normal, non-labored breathing GI: Soft.  No distension. There is no tenderness.  Skin: WDI  Neuro: Mental Status: awake, alert, oriented to person, place, year: 2020 Cranial Nerves: PERLA, EOMI, no apparent facial asymmetry, tongue midline Motor: 5/5 strength was present in all four extremities.  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  Unchanged encephalomalacia in the right parieto-occipital lobe with remote craniotomy.  ASSESSMENT/PLAN: 64 year old female with medically intractable epilepsy presented with ataxia in the setting of starting Xcopri while patient was on Dilantin.  Medical intractable epilepsy Ataxia -Patient was  recently started on Xcopri.  [name] can almost double phenytoin levels.  Of note, both phenytoin and Xcopri can individually cause ataxia as a side effect as well.  Recommendations -As patient's Dilantin levels are slightly supratherapeutic, will reduce Dilantin dose while patient is on Xcopri.  I attempted to call patient's daughter and son but it went to voicemail repeatedly.  Will wait for pharmacy medication reconciliation to determine the exact dosage of  AEds -If ataxia persists, then it will most likely be attributed to Xcopri which can then be gradually weaned off.  -If patient's seizure frequency get worse, can consider adjusting AEDs -I also discussed the case with patient's primary neurologist [name].  Patient has not been on oxcarbazepine in the past and it appears that oxcarbazepine does not have any significant interactions with Xcopri.  In order to avoid making too many medication changes at the same time, for now we will reduce her Dilantin. It might be reasonable to consider transitioning to oxcarbazepine once patient is on a stable dose of Xcopri. -Continue seizure precautions -As needed IV Ativan 2 mg only for generalized tonic-clonic seizure lasting more than 2 minutes or focal seizure lasting more than 5 minutes -MRI brain without contrast to rule out stroke as an etiology of ataxia although there is low suspicion. -Management of UTI per primary team.  Did discuss with Dr. Phillis Knack to perform patient is an antibiotic that does not interact with  phenytoin and Xcopri if possible.  Thank you for allowing [place] to participate in the care of this patient.  Neurology will follow.  Please page neuro hospitalist for any further questions after 5 PM.  Jakaiden Fill Barbra Sarks

## 2019-08-26 NOTE — Evaluation (Signed)
Physical Therapy Evaluation Patient Details Name: [name] MRN: [id number] DOB: [birth year] Today's Date: 08/26/2019   History of Present Illness  64 y.o. 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  Clinical Impression  Pt presents to PT with deficits in gait, coordination, functional mobility, balance, strength, vision. Pt reporting dizziness during all mobility and at rest. Pt with resting tremors and ataxic gait, placing her at a high falls risk during all OOB activity. Pt will benefit from continued acute PT POC to improve balance and gait in order to reduce falls risk. Pt may progress to home level if gait and balance deficits are related to post-ictal state, however pt is currently not appropriate for discharge home at this time. PT recommending CIR.    Follow Up Recommendations CIR;Supervision/Assistance - 24 hour(may progress to home with further therapies)    Equipment Recommendations  Rolling walker with 5" wheels    Recommendations for Other Services Rehab consult;OT consult     Precautions / Restrictions Precautions Precautions: Fall      Mobility  Bed Mobility Overal bed mobility: Needs Assistance Bed Mobility: Supine to Sit     Supine to sit: Supervision        Transfers Overall transfer level: Needs assistance Equipment used: 1 person hand held assist Transfers: Sit to/from Stand Sit to Stand: Min assist            Ambulation/Gait Ambulation/Gait assistance: Mod assist Gait Distance (Feet): 10 Feet Assistive device: 1 person hand held assist Gait Pattern/deviations: Step-to pattern;Ataxic Gait velocity: reduced Gait velocity interpretation: <1.8 ft/sec, indicate of risk for recurrent falls General Gait Details: pt with unsteady and  ataxic gait, increased sway and inconsistent stepping pattern  Stairs            Wheelchair Mobility    Modified Rankin (Stroke Patients Only)       Balance Overall balance assessment: Needs assistance Sitting-balance support: Single extremity supported;Feet supported Sitting balance-Leahy Scale: Fair Sitting balance - Comments: close supervision   Standing balance support: Single extremity supported Standing balance-Leahy Scale: Poor Standing balance comment: modA with unilateral hand hold                             Pertinent Vitals/Pain Pain Assessment: No/denies pain    Home Living Family/patient expects to be discharged to:: Private residence Living Arrangements: Children;Other relatives Available Help at Discharge: Family;Available 24 hours/day Type of Home: House Home Access: Level entry     Home Layout: Two level Home Equipment: Cane - single point      Prior Function Level of Independence: Independent               Hand Dominance   Dominant Hand: Right    Extremity/Trunk Assessment   Upper Extremity Assessment Upper Extremity Assessment: Generalized weakness(resting and intention tremor bilaterally, gross coordination)    Lower Extremity Assessment Lower Extremity Assessment: Generalized weakness;RLE deficits/detail;LLE deficits/detail RLE Coordination: decreased fine motor;decreased gross motor(slow and deliberate heel to shin) LLE Coordination: decreased fine motor;decreased gross motor(slow and deliberate heel to shin)       Communication   Communication: No difficulties  Cognition Arousal/Alertness: Awake/alert Behavior During Therapy: WFL for tasks assessed/performed Overall Cognitive Status: Within Functional Limits for tasks assessed  General Comments General comments (skin integrity, edema, etc.): , pt does report dizziness at rest and during mobility. Pt  with impaired ability to track horizontally and vertically, reporting history of peripheral vision loss    Exercises     Assessment/Plan    PT Assessment Patient needs continued PT services  PT Problem List Decreased strength;Decreased activity tolerance;Decreased balance;Decreased mobility;Decreased coordination;Decreased knowledge of use of DME       PT Treatment Interventions DME instruction;Gait training;Stair training;Functional mobility training;Therapeutic activities;Therapeutic exercise;Balance training;Neuromuscular re-education;Patient/family education    PT Goals (Current goals can be found in the Care Plan section)  Acute Rehab PT Goals Patient Stated Goal: To return to independent mobility PT Goal Formulation: With patient Time For Goal Achievement: 09/09/19 Potential to Achieve Goals: Good Additional Goals Additional Goal #1: Pt will maintain dynamic standing balance within 10 inches of her base of support with unilateral UE support of the least restrictive assistive device and supervision.    Frequency Min 4X/week   Barriers to discharge        Co-evaluation               AM-PAC PT "6 Clicks" Mobility  Outcome Measure Help needed turning from your back to your side while in a flat bed without using bedrails?: A Little Help needed moving from lying on your back to sitting on the side of a flat bed without using bedrails?: A Little Help needed moving to and from a bed to a chair (including a wheelchair)?: A Little Help needed standing up from a chair using your arms (e.g., wheelchair or bedside chair)?: A Little Help needed to walk in hospital room?: A Lot Help needed climbing 3-5 steps with a railing? : Total 6 Click Score: 15    End of Session Equipment Utilized During Treatment: (none) Activity Tolerance: Patient tolerated treatment well Patient left: in bed;with call bell/phone within reach;with bed alarm set Nurse Communication: Mobility status PT  Visit Diagnosis: Ataxic gait (R26.0)    Time: [id number] PT Time Calculation (min) (ACUTE ONLY): 21 min   Charges:   PT Evaluation $PT Eval Moderate Complexity: 1 Mod          Zenaida Niece, PT, DPT Acute Rehabilitation Pager: [phone]   Zenaida Niece 08/26/2019, 5:26 PM

## 2019-08-26 NOTE — Telephone Encounter (Signed)
Briviact 25mg  has been approved through [facility] until 12/31/202.  [facility] has been made aware.

## 2019-08-26 NOTE — ED Notes (Signed)
MD at bedside. 

## 2019-08-26 NOTE — H&P (Signed)
History and Physical    [name] [id number] DOB: 1955-10-24 DOA: 08/26/2019  Referring MD/NP/PA: Dorie Rank, MD PCP: [name], MD  Patient coming from: Home  Chief Complaint: Tremor I have personally briefly reviewed patient's old medical records in [place]   HPI: [name] is a 64 y.o. 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  She presents with complaints of tremor and weakness after having a witnessed seizure last night.  Following the seizure she slept the rest of the day away.  This morning when she woke up she was still feeling tremulous and was unable to walk to the bathroom.  Reported associated symptoms of complaints of shortness of breath and mild burning with urination.  She was started on new medication of cenobamate last month by her.  She is followed in outpatient setting by [name] of [facility].  Patient notes that she is seizures in the past whenever her Dilantin levels were elevated.  At baseline she is able to ambulate without use of assistance.   ED Course: Upon admission into the emergency department patient was noted to be afebrile, respirations 13-25, and all other vital signs maintained.  Labs significant for WBC 3.4, platelets 124,  potassium 3.4, and phenytoin level 22.2.  Point-of-care COVID-19 screening was negative.  CT scan of the brain showed no acute abnormalities with unchanged encephalomalacia of the right parietal lobe.  CT angiogram of the chest did not show any signs of pulmonary embolus or pneumonia.  Urinalysis was positive for nitrites, small leukocytes, and rare bacteria.  Neurology was formally consulted and felt that symptoms likely could be secondary to elevated levels of Dilantin.  They agreed with MRI to ensure no signs of a stroke.   Review of Systems    Constitutional: Positive for malaise/fatigue. Negative for chills and fever.  HENT: Negative for ear discharge and nosebleeds.   Eyes: Negative for photophobia and pain.  Respiratory: Positive for shortness of breath. Negative for hemoptysis.   Cardiovascular: Negative for chest pain and leg swelling.  Gastrointestinal: Negative for abdominal pain and vomiting.  Genitourinary: Positive for dysuria. Negative for flank pain.  Musculoskeletal: Negative for neck pain.  Neurological: Positive for tremors and sensory change (In both leg that she reports is chronic).       Positive for gait disturbance  Psychiatric/Behavioral: Positive for memory loss. Negative for substance abuse.    Past Medical History:  Diagnosis Date  . Asthma   . Cerebral hemorrhage ([place]) 1989  . Chronic back pain   . COPD (chronic obstructive pulmonary disease) ([place])   . DDD (degenerative disc disease) 06/12/2013  . DVT (deep venous thrombosis) ([place])    "she's had several since 1990"  . GERD (gastroesophageal reflux disease)   . Headache    "usually around time when she's had a seizure"  . History of blood transfusion    "when she was a baby"  . History of stomach ulcers   . Kidney stones   . Neuropathy   . Seizures ([place])    "because of her brain surgery"  . Stroke [facility]) [phone]   family denies residual on 11/09/2014  . Tunnel vision    "since brain OR"    Past Surgical History:  Procedure Laterality Date  . [place]   craniotomy with hematoma evacuation  . CESAREAN SECTION  1979; 1987; 1989  . HEMORRHOID SURGERY    . TUBAL LIGATION  1990's     reports that she quit smoking about 22 months ago. Her smoking use included cigarettes. She has a 10.00 pack-year smoking history. She has never used smokeless tobacco. She reports that she does not drink alcohol or use drugs.  Allergies  Allergen Reactions  . Zonisamide Other (See Comments)    Numbness and tingling over whole body  . Codeine  Nausea And Vomiting  . Keppra [Levetiracetam] Other (See Comments)    Causes seizures  . Pregabalin Nausea And Vomiting  . Olive Oil Rash    Family History  Problem Relation Age of Onset  . Heart attack Mother        4 MIs, started in her 54s, also vaginal cancer and colon cancer  . Cancer Mother   . Heart attack Father        Died suddenly age 63 - autopsy revealed heart attack her patient  . Cancer Brother        Sinus cancer    Prior to Admission medications   Medication Sig Start Date End Date Taking? Authorizing Provider  cholecalciferol (VITAMIN D3) 25 MCG (1000 UT) tablet Take 1,000 Units by mouth 2 (two) times daily.    [provider]  Cyanocobalamin (VITAMIN B-12) 1000 MCG TABS Take 1,000 mcg by mouth daily. 07/19/18   Thurnell Lose, MD  escitalopram (LEXAPRO) 10 MG tablet Take 1 tablet (10 mg total) by mouth daily. 03/10/19   [name], MD  gabapentin (NEURONTIN) 300 MG capsule Take 1 capsule every night 03/10/19   [name], MD  lacosamide (VIMPAT) 200 MG TABS tablet Take 1 tablet (200 mg total) by mouth 2 (two) times daily. 03/10/19   [name], MD  LORazepam (ATIVAN) 1 MG tablet Take 1 tablet as needed for seizure. Do not take more than 2 in 24 hours. 07/14/19   [name], MD  phenytoin (DILANTIN) 100 MG ER capsule Take 1 capsule twice a day (take with 30mg  capsule twice a day, total of 130mg  twice a day) 03/10/19   [name], MD  phenytoin (DILANTIN) 30 MG ER capsule Take 1 capsule (30 mg total) by mouth 2 (two) times daily. Take with 100 mg tablets 03/10/19   [name], MD    Physical Exam:  Constitutional: Elderly female who appears to be in no acute distress at this time Vitals:   08/26/19 0945 08/26/19 1000 08/26/19 1015 08/26/19 1030  BP:  107/64  (!) 104/57  Pulse:      Resp: 18 17 14 16   Temp:      TempSrc:      SpO2:       Eyes: PERRL, lids and conjunctivae normal ENMT: Mucous membranes are dry. Posterior pharynx  clear of any exudate or lesions. Neck: normal, supple, no masses, no thyromegaly Respiratory: clear to auscultation bilaterally, no wheezing, no crackles. Normal respiratory effort. No accessory muscle use.  Cardiovascular: Regular rate and rhythm, no murmurs / rubs / gallops. No extremity edema. 2+ pedal pulses. No carotid bruits.  Abdomen: no tenderness, no masses palpated. No hepatosplenomegaly. Bowel sounds positive.  Musculoskeletal: no clubbing / cyanosis. No joint deformity upper and lower extremities. Good ROM, no contractures. Normal muscle tone.  Skin: no rashes, lesions, ulcers. No induration Neurologic: CN 2-12 grossly intact. Sensation intact, DTR normal. Strength 5/5 in all 4.  Psychiatric: Normal judgment and insight. Alert and  oriented x 3. Normal mood.     Labs on Admission: I have personally reviewed following labs and imaging studies  CBC: Recent Labs  Lab 08/26/19 0322  WBC 3.4*  NEUTROABS 1.8  HGB 14.1  HCT 43.3  MCV 103.8*  PLT [id number]*   Basic Metabolic Panel: Recent Labs  Lab 08/26/19 0322  NA 145  K 3.4*  CL 109  CO2 26  GLUCOSE 94  BUN 5*  CREATININE 0.97  CALCIUM 9.3   GFR: CrCl cannot be calculated (Unknown ideal weight.). Liver Function Tests: Recent Labs  Lab 08/26/19 0322  AST 17  ALT 12  ALKPHOS 51  BILITOT 0.6  PROT 6.9  ALBUMIN 4.3   No results for input(s): LIPASE, AMYLASE in the last 168 hours. No results for input(s): AMMONIA in the last 168 hours. Coagulation Profile: No results for input(s): INR, PROTIME in the last 168 hours. Cardiac Enzymes: No results for input(s): CKTOTAL, CKMB, CKMBINDEX, TROPONINI in the last 168 hours. BNP (last 3 results) No results for input(s): PROBNP in the last 8760 hours. HbA1C: No results for input(s): HGBA1C in the last 72 hours. CBG: No results for input(s): GLUCAP in the last 168 hours. Lipid Profile: No results for input(s): CHOL, HDL, LDLCALC, TRIG, CHOLHDL, LDLDIRECT in the last 72  hours. Thyroid Function Tests: No results for input(s): TSH, T4TOTAL, FREET4, T3FREE, THYROIDAB in the last 72 hours. Anemia Panel: No results for input(s): VITAMINB12, FOLATE, FERRITIN, TIBC, IRON, RETICCTPCT in the last 72 hours. Urine analysis:    Component Value Date/Time   COLORURINE YELLOW 08/26/2019 0510   APPEARANCEUR CLEAR 08/26/2019 0510   LABSPEC 1.002 (L) 08/26/2019 0510   PHURINE 7.0 08/26/2019 0510   GLUCOSEU NEGATIVE 08/26/2019 0510   HGBUR NEGATIVE 08/26/2019 0510   BILIRUBINUR NEGATIVE 08/26/2019 0510   KETONESUR NEGATIVE 08/26/2019 0510   PROTEINUR NEGATIVE 08/26/2019 0510   UROBILINOGEN 0.2 01/17/2015 2350   NITRITE POSITIVE (A) 08/26/2019 0510   LEUKOCYTESUR SMALL (A) 08/26/2019 0510   Sepsis Labs: No results found for this or any previous visit (from the past 240 hour(s)).   Radiological Exams on Admission: CT Head Wo Contrast  Result Date: 08/26/2019 CLINICAL DATA:  Focal neuro deficit, > 6 hrs, stroke suspected EXAM: CT HEAD WITHOUT CONTRAST TECHNIQUE: Contiguous axial images were obtained from the base of the skull through the vertex without intravenous contrast. COMPARISON:  Head CT 07/16/2018 FINDINGS: Brain: Stable large area of encephalomalacia in the right parietooccipital lobe with associated ex vacuo dilatation of the right lateral ventricle. No hemorrhage. No evidence of acute ischemia. Stable atrophy. No subdural or extra-axial collection. Basilar cisterns are patent. No hydrocephalus. Vascular: Atherosclerosis of skullbase vasculature without hyperdense vessel or abnormal calcification. Skull: Right craniotomy, craniotomy flap no fracture or acute abnormality. Unchanged in alignment from prior. Sinuses/Orbits: Chronic opacification of lower left mastoid air cells. No acute findings. Other: None. IMPRESSION: 1. No acute intracranial abnormality. 2. Unchanged encephalomalacia in the right parietooccipital lobe with remote craniotomy. Electronically Signed    By: Keith Rake M.D.   On: 08/26/2019 05:47   CT Angio Chest PE W and/or Wo Contrast  Result Date: 08/26/2019 CLINICAL DATA:  Shortness of breath. Seizures. EXAM: CT ANGIOGRAPHY CHEST WITH CONTRAST TECHNIQUE: Multidetector CT imaging of the chest was performed using the standard protocol during bolus administration of intravenous contrast. Multiplanar CT image reconstructions and MIPs were obtained to evaluate the vascular anatomy. CONTRAST:  5mL OMNIPAQUE IOHEXOL 350 MG/ML SOLN COMPARISON:  05/23/2018 FINDINGS: Cardiovascular: Pulmonary arterial  opacification is adequate without evidence of emboli. There is mild thoracic aortic atherosclerosis without aneurysm. The heart is normal in size. There is no pericardial effusion. Mediastinum/Nodes: No enlarged axillary, mediastinal, or hilar lymph nodes. Unremarkable thyroid and esophagus. Lungs/Pleura: No pleural effusion or pneumothorax. Centrilobular emphysema. Unchanged scattered small lung nodules with the largest measuring 4 mm in the right upper lobe (series 6, image 96). Mild dependent atelectasis in the right greater than left lower lobes. Mild scarring in the right middle lobe. Upper Abdomen: No acute abnormality. Musculoskeletal: No acute osseous abnormality or suspicious osseous lesion. Thoracic spondylosis. Old rib fractures. Review of the MIP images confirms the above findings. IMPRESSION: 1. No evidence of pulmonary emboli or other acute abnormality in the chest. 2. Aortic Atherosclerosis (ICD10-I70.0) and Emphysema (ICD10-J43.9). Electronically Signed   By: Logan Bores M.D.   On: 08/26/2019 07:50   DG Chest Portable 1 View  Result Date: 08/26/2019 CLINICAL DATA:  Shortness of breath EXAM: PORTABLE CHEST 1 VIEW COMPARISON:  CT angiogram chest August 26, 2019; chest radiograph May 29, 2018 FINDINGS: There is mild bibasilar atelectasis. No edema or consolidation. Heart size and pulmonary vascularity are normal. No adenopathy. There is  aortic atherosclerosis. No bone lesions. IMPRESSION: Bibasilar atelectasis. Lungs elsewhere clear. Cardiac silhouette normal. No adenopathy. Aortic Atherosclerosis (ICD10-I70.0). Electronically Signed   By: Lowella Grip III M.D.   On: 08/26/2019 07:59    EKG: Independently reviewed.  Sinus rhythm at 79 bpm  Assessment/Plan Ataxia, supratherapeutic Dilantin level, seizure disorder: Acute.  Patient presents after having seizure yesterday with difficulty ambulating, and found to have Dilantin level elevated at 22.  She had just recently been started on a new medication of Xcopri.  Neurology noted that the medication can double phenytoin levels and that both medications have been associated with ataxia.  Neurology adjusted Dilantin dose. -Admit to a medical telemetry bed -Seizure precaution -Follow-up MRI of the brain -Ativan 2 mg as needed for seizure -Continue AEDs per neurology recommendations -PT/OT to evaluate and treat -Appreciate neurology consultative services, we will follow for further recommendation   UTI: Acute.  Patient complains of having mild burning with urination.  Urinalysis positive for nitrates and leukocytes with rare bacteria. -Follow-up urine culture -Rocephin IV   DVT prophylaxis: Lovenox Code Status: Full Family Communication: Patient reports that she is unable to contact her family because they are on vacation. Disposition Plan: Possible discharge home in 1 to 2 days Consults called: Neurology Admission status: Observation  Norval Morton MD [facility] Pager [phone]   If 7PM-7AM, please contact night-coverage [email or web address] Password [facility]  08/26/2019, 10:42 AM

## 2019-08-26 NOTE — ED Notes (Signed)
Pt transported to MRI 

## 2019-08-26 NOTE — ED Notes (Signed)
EDP at bedside  

## 2019-08-26 NOTE — ED Provider Notes (Addendum)
Patient initially seen by Dr. Stark Jock.  Please see his note  8:55 AM patient is feeling better now.  She is breathing easily.  Vital signs are normal.  ED work-up reviewed.  No significant laboratory abnormalities.  Phenytoin level slightly supratherapeutic but unlikely to be the source of her symptoms.  Covid test is negative.'s chest x-ray is negative and CT angio does not show pulmonary embolism.  Discussed findings with the patient.  She has started some new medications, xcopri. Unclear if this could be a contributing factor.  Will have her contact her neurologist to review.  Will have pt ambulate to make sure she is stable for discharge   [name] Rank, MD 08/26/19 0901   Pt attempted to ambulate.  Very unsteady.  Ataxia is new for patient.  Will consult neurology, proceed with MRI.  Anticipate need for admission, further workup   [name] Rank, MD 08/26/19 [phone]

## 2019-08-27 DIAGNOSIS — Z8711 Personal history of peptic ulcer disease: Secondary | ICD-10-CM | POA: Diagnosis not present

## 2019-08-27 DIAGNOSIS — K219 Gastro-esophageal reflux disease without esophagitis: Secondary | ICD-10-CM | POA: Diagnosis present

## 2019-08-27 DIAGNOSIS — T83518A Infection and inflammatory reaction due to other urinary catheter, initial encounter: Secondary | ICD-10-CM | POA: Diagnosis present

## 2019-08-27 DIAGNOSIS — Z800 Family history of malignant neoplasm of digestive organs: Secondary | ICD-10-CM | POA: Diagnosis not present

## 2019-08-27 DIAGNOSIS — G8929 Other chronic pain: Secondary | ICD-10-CM | POA: Diagnosis present

## 2019-08-27 DIAGNOSIS — Q282 Arteriovenous malformation of cerebral vessels: Secondary | ICD-10-CM | POA: Diagnosis not present

## 2019-08-27 DIAGNOSIS — Z8249 Family history of ischemic heart disease and other diseases of the circulatory system: Secondary | ICD-10-CM | POA: Diagnosis not present

## 2019-08-27 DIAGNOSIS — Z8619 Personal history of other infectious and parasitic diseases: Secondary | ICD-10-CM | POA: Diagnosis not present

## 2019-08-27 DIAGNOSIS — G9389 Other specified disorders of brain: Secondary | ICD-10-CM | POA: Diagnosis present

## 2019-08-27 DIAGNOSIS — N390 Urinary tract infection, site not specified: Secondary | ICD-10-CM | POA: Diagnosis present

## 2019-08-27 DIAGNOSIS — R270 Ataxia, unspecified: Secondary | ICD-10-CM | POA: Diagnosis present

## 2019-08-27 DIAGNOSIS — R531 Weakness: Secondary | ICD-10-CM | POA: Diagnosis present

## 2019-08-27 DIAGNOSIS — Z802 Family history of malignant neoplasm of other respiratory and intrathoracic organs: Secondary | ICD-10-CM | POA: Diagnosis not present

## 2019-08-27 DIAGNOSIS — G629 Polyneuropathy, unspecified: Secondary | ICD-10-CM | POA: Diagnosis present

## 2019-08-27 DIAGNOSIS — M549 Dorsalgia, unspecified: Secondary | ICD-10-CM | POA: Diagnosis present

## 2019-08-27 DIAGNOSIS — Z8673 Personal history of transient ischemic attack (TIA), and cerebral infarction without residual deficits: Secondary | ICD-10-CM | POA: Diagnosis not present

## 2019-08-27 DIAGNOSIS — Z20822 Contact with and (suspected) exposure to covid-19: Secondary | ICD-10-CM | POA: Diagnosis present

## 2019-08-27 DIAGNOSIS — T420X5A Adverse effect of hydantoin derivatives, initial encounter: Secondary | ICD-10-CM | POA: Diagnosis present

## 2019-08-27 DIAGNOSIS — T50995A Adverse effect of other drugs, medicaments and biological substances, initial encounter: Secondary | ICD-10-CM | POA: Diagnosis present

## 2019-08-27 DIAGNOSIS — R251 Tremor, unspecified: Secondary | ICD-10-CM | POA: Diagnosis present

## 2019-08-27 DIAGNOSIS — Z87891 Personal history of nicotine dependence: Secondary | ICD-10-CM | POA: Diagnosis not present

## 2019-08-27 DIAGNOSIS — R7889 Finding of other specified substances, not normally found in blood: Secondary | ICD-10-CM | POA: Diagnosis not present

## 2019-08-27 DIAGNOSIS — J449 Chronic obstructive pulmonary disease, unspecified: Secondary | ICD-10-CM | POA: Diagnosis present

## 2019-08-27 DIAGNOSIS — Z1611 Resistance to penicillins: Secondary | ICD-10-CM | POA: Diagnosis present

## 2019-08-27 DIAGNOSIS — G40919 Epilepsy, unspecified, intractable, without status epilepticus: Secondary | ICD-10-CM | POA: Diagnosis present

## 2019-08-27 LAB — BASIC METABOLIC PANEL
Anion gap: 8 (ref 5–15)
BUN: 9 mg/dL (ref 8–23)
CO2: 26 mmol/L (ref 22–32)
Calcium: 8.5 mg/dL — ABNORMAL LOW (ref 8.9–10.3)
Chloride: 109 mmol/L (ref 98–111)
Creatinine, Ser: 0.87 mg/dL (ref 0.44–1.00)
GFR calc Af Amer: >60 mL/min (ref >60)
GFR calc non Af Amer: >60 mL/min (ref >60)
Glucose, Bld: 98 mg/dL (ref 70–99)
Potassium: 4.1 mmol/L (ref 3.5–5.1)
Sodium: 143 mmol/L (ref 135–145)

## 2019-08-27 LAB — CBC
HCT: 38.4 % (ref 36.0–46.0)
Hemoglobin: 12.6 g/dL (ref 12.0–15.0)
MCH: 34.1 pg — ABNORMAL HIGH (ref 26.0–34.0)
MCHC: 32.8 g/dL (ref 30.0–36.0)
MCV: 104.1 fL — ABNORMAL HIGH (ref 80.0–100.0)
Platelets: 114 10*3/uL — ABNORMAL LOW (ref 150–400)
RBC: 3.69 MIL/uL — ABNORMAL LOW (ref 3.87–5.11)
RDW: 12.7 % (ref 11.5–15.5)
WBC: 3.3 10*3/uL — ABNORMAL LOW (ref 4.0–10.5)
nRBC: 0 % (ref 0.0–0.2)

## 2019-08-27 LAB — MAGNESIUM: Magnesium: 2 mg/dL (ref 1.7–2.4)

## 2019-08-27 LAB — PHENYTOIN LEVEL, TOTAL: Phenytoin Lvl: 15.4 ug/mL (ref 10.0–20.0)

## 2019-08-27 MED ORDER — SODIUM CHLORIDE 0.9 % IV SOLN: INTRAVENOUS | Status: AC

## 2019-08-27 NOTE — Progress Notes (Signed)
Rehab Admissions Coordinator Note:  Patient was screened by Michel Santee for appropriateness for an Inpatient Acute Rehab Consult. Noted pt is observation status at this time. Pt may not have the medical necessity to warrant an inpatient rehab stay with current diagnosis.  Note Neurology consult considering relationship between ataxia and medication adjustments.  Negative imaging so far.  Does not appear to be a candidate for CIR. Recommend f/u with lower level of care if pt does not progress back to baseline mobility.   Michel Santee 08/27/2019, 11:55 AM  I can be reached at [phone].

## 2019-08-27 NOTE — Progress Notes (Signed)
PROGRESS NOTE    [name]  [id number] DOB: [birth year] DOA: 08/26/2019 PCP: [name], MD   Brief Narrative:  HPI: [name] is a 64 y.o. 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  She presents with complaints of tremor and weakness after having a witnessed seizure last night.  Following the seizure she slept the rest of the day away.  This morning when she woke up she was still feeling tremulous and was unable to walk to the bathroom.  Reported associated symptoms of complaints of shortness of breath and mild burning with urination.  She was started on new medication of cenobamate last month by her.  She is followed in outpatient setting by [name] of [facility].  Patient notes that she is seizures in the past whenever her Dilantin levels were elevated.  At baseline she is able to ambulate without use of assistance.   ED Course: Upon admission into the emergency department patient was noted to be afebrile, respirations 13-25, and all other vital signs maintained.  Labs significant for WBC 3.4, platelets 124,  potassium 3.4, and phenytoin level 22.2.  Point-of-care COVID-19 screening was negative.  CT scan of the brain showed no acute abnormalities with unchanged encephalomalacia of the right parietal lobe.  CT angiogram of the chest did not show any signs of pulmonary embolus or pneumonia.  Urinalysis was positive for nitrites, small leukocytes, and rare bacteria.  Neurology was formally consulted and felt that symptoms likely could be secondary to elevated levels of Dilantin.  They agreed with MRI to ensure no signs of a stroke.   Assessment & Plan:   Principal Problem:   Ataxia Active Problems:   Seizure disorder (HCC)   Urinary tract infection associated with catheterization of urinary tract, initial encounter  (HCC)   Elevated Dilantin level   Ataxia/seizure disorder: Was found to have supratherapeutic Dilantin level.  Seen by neurologist.  AEDs adjusted.  Still with symptoms.  Seen by PT OT and the recommended CIR.  Evaluated by CIR coordinator and does not seem to be a candidate for CIR, per coordinator.  MRI brain unremarkable.  Still significantly symptomatic.  Will need to stay in the hospital.  Unsafe to go home today.  Needs further PT.  Appreciate neurology help and defer further management to them.  UTI: Continue IV Rocephin and follow culture.  DVT prophylaxis: Lovenox Code Status: Full code Family Communication:  None present at bedside.  Plan of care discussed with patient in length and he verbalized understanding and agreed with it. Disposition Plan: To be determined.  Either home with home health or SNF based on PT evaluation.  Potential discharge in next 1 to 2 days.  Estimated body mass index is 20.75 kg/m as calculated from the following:   Height as of this encounter: 5\' 7"  (1.702 m).   Weight as of this encounter: 60.1 kg.      Nutritional status:               Consultants:   Neurology  Procedures:   None  Antimicrobials:   Rocephin   Subjective: Seen and examined.  Still ataxic and has significant upper body tremors which are chronic.  No other complaint.  Objective: Vitals:   08/26/19 2314 08/27/19 0439 08/27/19 0500 08/27/19 0820  BP: (!) 95/50 (!) 100/48  (!) 95/54  Pulse: 67  67  71  Resp: 18 18  16   Temp: 97.9 F (36.6 C) 98.4 F (36.9 C)  98.4 F (36.9 C)  TempSrc: Oral Oral  Oral  SpO2: 94% 94%    Weight:   60.1 kg   Height:   5\' 7"  (1.702 m)     Intake/Output Summary (Last 24 hours) at 08/27/2019 1329 Last data filed at 08/27/2019 0800 Gross per 24 hour  Intake 600 ml  Output 900 ml  Net -300 ml   Filed Weights   08/27/19 0500  Weight: 60.1 kg    Examination:  General exam: Appears calm and comfortable with significant  upper body tremors. Respiratory system: Clear to auscultation. Respiratory effort normal. Cardiovascular system: S1 & S2 heard, RRR. No JVD, murmurs, rubs, gallops or clicks. No pedal edema. Gastrointestinal system: Abdomen is nondistended, soft and nontender. No organomegaly or masses felt. Normal bowel sounds heard. Central nervous system: Alert and oriented. No focal neurological deficits. Extremities: Symmetric 5 x 5 power. Skin: No rashes, lesions or ulcers Psychiatry: Judgement and insight appear normal. Mood & affect appropriate.    Data Reviewed: I have personally reviewed following labs and imaging studies  CBC: Recent Labs  Lab 08/26/19 0322 08/27/19 0434  WBC 3.4* 3.3*  NEUTROABS 1.8  --   HGB 14.1 12.6  HCT 43.3 38.4  MCV 103.8* 104.1*  PLT 124* [id number]*   Basic Metabolic Panel: Recent Labs  Lab 08/26/19 0322 08/27/19 0434  NA 145 143  K 3.4* 4.1  CL 109 109  CO2 26 26  GLUCOSE 94 98  BUN 5* 9  CREATININE 0.97 0.87  CALCIUM 9.3 8.5*  MG  --  2.0   GFR: Estimated Creatinine Clearance: 64.4 mL/min (by C-G formula based on SCr of 0.87 mg/dL). Liver Function Tests: Recent Labs  Lab 08/26/19 0322  AST 17  ALT 12  ALKPHOS 51  BILITOT 0.6  PROT 6.9  ALBUMIN 4.3   No results for input(s): LIPASE, AMYLASE in the last 168 hours. No results for input(s): AMMONIA in the last 168 hours. Coagulation Profile: No results for input(s): INR, PROTIME in the last 168 hours. Cardiac Enzymes: No results for input(s): CKTOTAL, CKMB, CKMBINDEX, TROPONINI in the last 168 hours. BNP (last 3 results) No results for input(s): PROBNP in the last 8760 hours. HbA1C: No results for input(s): HGBA1C in the last 72 hours. CBG: No results for input(s): GLUCAP in the last 168 hours. Lipid Profile: No results for input(s): CHOL, HDL, LDLCALC, TRIG, CHOLHDL, LDLDIRECT in the last 72 hours. Thyroid Function Tests: No results for input(s): TSH, T4TOTAL, FREET4, T3FREE, THYROIDAB in  the last 72 hours. Anemia Panel: No results for input(s): VITAMINB12, FOLATE, FERRITIN, TIBC, IRON, RETICCTPCT in the last 72 hours. Sepsis Labs: No results for input(s): PROCALCITON, LATICACIDVEN in the last 168 hours.  No results found for this or any previous visit (from the past 240 hour(s)).    Radiology Studies: CT Head Wo Contrast  Result Date: 08/26/2019 CLINICAL DATA:  Focal neuro deficit, > 6 hrs, stroke suspected EXAM: CT HEAD WITHOUT CONTRAST TECHNIQUE: Contiguous axial images were obtained from the base of the skull through the vertex without intravenous contrast. COMPARISON:  Head CT 07/16/2018 FINDINGS: Brain: Stable large area of encephalomalacia in the right parietooccipital lobe with associated ex vacuo dilatation of the right lateral ventricle. No hemorrhage. No evidence of acute ischemia. Stable atrophy. No subdural or extra-axial collection. Basilar cisterns are patent. No hydrocephalus. Vascular: Atherosclerosis of skullbase vasculature without  hyperdense vessel or abnormal calcification. Skull: Right craniotomy, craniotomy flap no fracture or acute abnormality. Unchanged in alignment from prior. Sinuses/Orbits: Chronic opacification of lower left mastoid air cells. No acute findings. Other: None. IMPRESSION: 1. No acute intracranial abnormality. 2. Unchanged encephalomalacia in the right parietooccipital lobe with remote craniotomy. Electronically Signed   By: Keith Rake M.D.   On: 08/26/2019 05:47   CT Angio Chest PE W and/or Wo Contrast  Result Date: 08/26/2019 CLINICAL DATA:  Shortness of breath. Seizures. EXAM: CT ANGIOGRAPHY CHEST WITH CONTRAST TECHNIQUE: Multidetector CT imaging of the chest was performed using the standard protocol during bolus administration of intravenous contrast. Multiplanar CT image reconstructions and MIPs were obtained to evaluate the vascular anatomy. CONTRAST:  51mL OMNIPAQUE IOHEXOL 350 MG/ML SOLN COMPARISON:  05/23/2018 FINDINGS:  Cardiovascular: Pulmonary arterial opacification is adequate without evidence of emboli. There is mild thoracic aortic atherosclerosis without aneurysm. The heart is normal in size. There is no pericardial effusion. Mediastinum/Nodes: No enlarged axillary, mediastinal, or hilar lymph nodes. Unremarkable thyroid and esophagus. Lungs/Pleura: No pleural effusion or pneumothorax. Centrilobular emphysema. Unchanged scattered small lung nodules with the largest measuring 4 mm in the right upper lobe (series 6, image 96). Mild dependent atelectasis in the right greater than left lower lobes. Mild scarring in the right middle lobe. Upper Abdomen: No acute abnormality. Musculoskeletal: No acute osseous abnormality or suspicious osseous lesion. Thoracic spondylosis. Old rib fractures. Review of the MIP images confirms the above findings. IMPRESSION: 1. No evidence of pulmonary emboli or other acute abnormality in the chest. 2. Aortic Atherosclerosis (ICD10-I70.0) and Emphysema (ICD10-J43.9). Electronically Signed   By: Logan Bores M.D.   On: 08/26/2019 07:50   MR BRAIN WO CONTRAST  Result Date: 08/26/2019 CLINICAL DATA:  Seizures. Ataxia. EXAM: MRI HEAD WITHOUT CONTRAST TECHNIQUE: Multiplanar, multiecho pulse sequences of the brain and surrounding structures were obtained without intravenous contrast. COMPARISON:  Head CT 08/26/2019 and MRI 05/31/2018 FINDINGS: The study is mildly motion degraded. Brain: There is no evidence of acute infarct, intracranial hemorrhage, mass, midline shift, or extra-axial fluid collection. A large area of right parieto-occipital encephalomalacia is again noted communicating with a dilated right lateral ventricle, unchanged from the prior MRI. Minimal T2 FLAIR hyperintensity in the white matter adjacent to the left lateral ventricle is unchanged and nonspecific but compatible with chronic small vessel ischemia. There is chronic asymmetric right hippocampal volume loss. Vascular: Major  intracranial vascular flow voids are preserved. Skull and upper cervical spine: Right parieto-occipital craniotomy. No suspicious marrow lesion. Sinuses/Orbits: Unremarkable orbits. Clear paranasal sinuses. Chronic left mastoid effusion. Other: None. IMPRESSION: 1. No acute intracranial abnormality. 2. Unchanged right parieto-occipital encephalomalacia. Electronically Signed   By: Logan Bores M.D.   On: 08/26/2019 13:37   DG Chest Portable 1 View  Result Date: 08/26/2019 CLINICAL DATA:  Shortness of breath EXAM: PORTABLE CHEST 1 VIEW COMPARISON:  CT angiogram chest August 26, 2019; chest radiograph May 29, 2018 FINDINGS: There is mild bibasilar atelectasis. No edema or consolidation. Heart size and pulmonary vascularity are normal. No adenopathy. There is aortic atherosclerosis. No bone lesions. IMPRESSION: Bibasilar atelectasis. Lungs elsewhere clear. Cardiac silhouette normal. No adenopathy. Aortic Atherosclerosis (ICD10-I70.0). Electronically Signed   By: Lowella Grip III M.D.   On: 08/26/2019 07:59    Scheduled Meds: . enoxaparin (LOVENOX) injection  40 mg Subcutaneous Q24H  . escitalopram  10 mg Oral Daily  . gabapentin  300 mg Oral QHS  . lacosamide  200 mg Oral BID  . phenytoin  100 mg Oral BID  . sodium chloride flush  3 mL Intravenous Q12H   Continuous Infusions: . cefTRIAXone (ROCEPHIN)  IV 1 g (08/27/19 1147)     LOS: 0 days   Time spent: 31 minutes   Darliss Cheney, MD [facility]  08/27/2019, 1:29 PM   To contact the attending provider between 7A-7P or the covering provider during after hours 7P-7A, please log into the web site [email or web address].

## 2019-08-27 NOTE — Progress Notes (Signed)
Patient was reported to be lethargic due to new seizure medication given and also on prescribed dilantin. Most of the time asleep but when arousable, she is able to answer questions. Patient A/O x4. Will continue to monitor.

## 2019-08-27 NOTE — Evaluation (Signed)
Occupational Therapy Evaluation Patient Details Name: [name] MRN: [id number] DOB: [birth year] Today's Date: 08/27/2019    History of Present Illness 64 y.o. 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 Dx: likely Dilantin toxicity    Clinical Impression   Pt admitted with above. She demonstrates the below listed deficits and will benefit from continued OT to maximize safety and independence with BADLs.  Pt presents to OT with ataxia, impaired balance, decreased activity tolerance, and bil. UE tremor.  She currently requires min guard to mod A for ADLs, and min A - mod A for functional mobility - as she fatigues, she demonstrates Rt lateral lean and requires increased assist.  She reports she was independent PTA, lives with her daughter, daughter's family and her 45 y.o ex mother in [employer].  Feel she would benefit from CIR level therapies due to allow her to maximize safety and independence with ADLs.       Follow Up Recommendations  CIR;Supervision/Assistance - 24 hour    Equipment Recommendations  3 in 1 bedside commode    Recommendations for Other Services Rehab consult     Precautions / Restrictions Precautions Precautions: Fall Restrictions Weight Bearing Restrictions: No      Mobility Bed Mobility Overal bed mobility: Needs Assistance Bed Mobility: Supine to Sit;Sit to Supine     Supine to sit: Supervision Sit to supine: Supervision   General bed mobility comments: supervision for safety to the edge of the bed   Transfers Overall transfer level: Needs assistance Equipment used: Rolling walker (2 wheeled) Transfers: Sit to/from Stand Sit to Stand: Min guard;Mod assist         General transfer comment: Pt initially required min guard to stand from  bed due to imbalance, but required mod A to move sit to stand from commode.  As she fatigued, she required increased amount of assist     Balance Overall balance assessment: Needs assistance Sitting-balance support: Single extremity supported;Feet supported Sitting balance-Leahy Scale: Fair Sitting balance - Comments:  supervision   Standing balance support: Single extremity supported Standing balance-Leahy Scale: Poor Standing balance comment: modA with unilateral hand hold                           ADL either performed or assessed with clinical judgement   ADL Overall ADL's : Needs assistance/impaired Eating/Feeding: Modified independent;Sitting   Grooming: Wash/dry hands;Wash/dry face;Oral care;Brushing hair;Minimal assistance;Standing   Upper Body Bathing: Set up;Sitting   Lower Body Bathing: Moderate assistance;Sit to/from stand   Upper Body Dressing : Min guard;Sitting   Lower Body Dressing: Moderate assistance;Sit to/from stand   Toilet Transfer: Moderate assistance;Ambulation;Comfort height toilet;Grab bars;RW Armed forces technical officer Details (indicate cue type and reason): Pt lost balance posteriorly when attempting to stand from commode  Toileting- Clothing Manipulation and Hygiene: Moderate assistance;Sit to/from stand       Functional mobility during ADLs: Minimal assistance;Moderate assistance;Rolling walker       Vision Baseline Vision/History: (impaired peripheral vision ) Patient Visual Report: Peripheral vision impairment Additional Comments: Pt reports vision is at baseline      Perception Perception Perception Tested?: Yes   Praxis Praxis Praxis tested?: Within functional limits    Pertinent Vitals/Pain Pain Assessment: Faces Faces Pain Scale: Hurts even more Pain Location: both feet  Pain Descriptors / Indicators: Aching;Cramping Pain Intervention(s): Monitored during session;Limited activity within patient's tolerance;Repositioned      Hand Dominance Right   Extremity/Trunk Assessment Upper Extremity Assessment Upper Extremity Assessment: Generalized weakness;RUE deficits/detail;LUE deficits/detail RUE Deficits / Details: bil. UEs tremulous as she fatigues Rt > Lt  RUE Coordination: decreased gross motor;decreased fine motor LUE Deficits / Details: bil. UEs tremulous as she fatigues Rt > Lt  LUE Coordination: decreased gross motor;decreased fine motor   Lower Extremity Assessment Lower Extremity Assessment: Defer to PT evaluation   Cervical / Trunk Assessment Cervical / Trunk Assessment: Kyphotic   Communication Communication Communication: No difficulties   Cognition Arousal/Alertness: Awake/alert Behavior During Therapy: WFL for tasks assessed/performed Overall Cognitive Status: Within Functional Limits for tasks assessed                                 General Comments: WFL for basic into/tasks    General Comments       Exercises     Shoulder Instructions      Home Living Family/patient expects to be discharged to:: Private residence Living Arrangements: Children;Other relatives Available Help at Discharge: Family;Available 24 hours/day Type of Home: House Home Access: Level entry     Home Layout: Two level Alternate Level Stairs-Number of Steps: 15 Alternate Level Stairs-Rails: Left Bathroom Shower/Tub: Occupational psychologist: Standard     Home Equipment: Cane - single point   Additional Comments: Pt reports she lives with her daughter and her daughter's family as well as her ex mother in law, who is 46      Prior Functioning/Environment Level of Independence: Independent        Comments: Pt reports she was independent with ADLs, and IADLs, but does not drive.  She does endores h/o falls mostly she attributes to seizure activity         OT Problem List: Decreased strength;Decreased activity tolerance;Impaired balance (sitting and/or  standing);Impaired vision/perception;Decreased safety awareness;Decreased knowledge of use of DME or AE;Pain      OT Treatment/Interventions: Self-care/ADL training;Neuromuscular education;DME and/or AE instruction;Therapeutic activities;Patient/family education;Balance training    OT Goals(Current goals can be found in the care plan section) Acute Rehab OT Goals Patient Stated Goal: to get this figured out and go home  OT Goal Formulation: With patient Time For Goal Achievement: 09/10/19 Potential to Achieve Goals: Good ADL Goals Pt Will Perform Grooming: with min guard assist;standing Pt Will Perform Lower Body Bathing: with min guard assist;sit to/from stand Pt Will Perform Lower Body Dressing: with min guard assist;sit to/from stand Pt Will Transfer to Toilet: with min guard assist;ambulating;regular height toilet;bedside commode;grab bars Pt Will Perform Toileting - Clothing Manipulation and hygiene: with min guard assist;sit to/from stand  OT Frequency: Min 2X/week   Barriers to D/C: Decreased caregiver support  her primary caregiver is 62 y.o.        Co-evaluation              AM-PAC OT "6 Clicks" Daily Activity     Outcome Measure Help from another person eating meals?: None Help from another person taking care of personal grooming?: A Little Help from another person toileting, which includes using toliet, bedpan, or urinal?: A Lot Help from another person bathing (including washing, rinsing, drying)?: A Lot Help from another person to put on and taking off regular upper body clothing?: A Little Help from another person to put on and taking  off regular lower body clothing?: A Lot 6 Click Score: 16   End of Session Equipment Utilized During Treatment: Surveyor, mining Communication: Mobility status  Activity Tolerance: Patient tolerated treatment well Patient left: in bed;with call bell/phone within reach;with bed alarm set  OT Visit Diagnosis: Unsteadiness on  feet (R26.81);Ataxia, unspecified (R27.0)                Time: [id number] OT Time Calculation (min): 20 min Charges:  OT General Charges $OT Visit: 1 Visit OT Evaluation $OT Eval Moderate Complexity: 1 Mod  Nilsa Nutting., OTR/L Acute Rehabilitation Services Pager [phone] Office [phone]   Lucille Passy M 08/27/2019, 6:38 PM

## 2019-08-27 NOTE — Progress Notes (Signed)
Subjective: Feels somewhat better, though still unsteady when she tries to walk.  Exam: Vitals:   08/27/19 0820 08/27/19 1408  BP: (!) 95/54 (!) 112/54  Pulse: 71 72  Resp: 16 16  Temp: 98.4 F (36.9 C) 98.4 F (36.9 C)  SpO2:  94%   Gen: In bed, NAD Resp: non-labored breathing, no acute distress Abd: soft, nt  Neuro: MS: Awake, alert, interactive and appropriate, oriented CN: Pupils equal round and reactive, she has saccadic smooth pursuit but no nystagmus Motor: Moves all extremities well Sensory: Intact to light touch Cerebellar: Slight ataxia bilaterally  Pertinent Labs: Dilantin 15.4  Impression: 64 year old female with worsening ataxia in the setting of new medication.  I agree that Dilantin toxicity is likely etiology, she is improving now that her level is trending down.  She is still slightly unsteady and therefore I think continue to monitor rechecking level tomorrow on the reduced dose would be prudent.  I would continue Xcopri and she did bring in her home dose today.  Recommendations: 1) continue Dilantin 100 twice daily, reduced from 130 twice daily previously, repeat level tomorrow 2) continue Vimpat 200 twice daily 3) continue gabapentin 300 nightly 4) treatment of UTI per internal medicine 5) neurology will continue to follow  Roland Rack, MD [facility] [phone]  If 7pm- 7am, please page neurology on call as listed in [place].

## 2019-08-27 NOTE — Progress Notes (Signed)
Physical Therapy Treatment Patient Details Name: [name] MRN: [id number] DOB: [birth year] Today's Date: 08/27/2019    History of Present Illness 64 y.o. 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    PT Comments    Patient demonstrated improve gait distance today but was limited by foot pain. She felt increased pain with walkink. She is concerned about getting up and down her stairs at home with her feet like this. She demonstrated improved ataxia and syncope with gait. Acute therapy will continue to progress mobility as tolerated.    Follow Up Recommendations  Home health PT;CIR     Equipment Recommendations  Rolling walker with 5" wheels    Recommendations for Other Services Rehab consult     Precautions / Restrictions Precautions Precautions: Fall Restrictions Weight Bearing Restrictions: No    Mobility  Bed Mobility Overal bed mobility: Needs Assistance Bed Mobility: Supine to Sit     Supine to sit: Supervision     General bed mobility comments: supervision for safety to the edge of the bed   Transfers Overall transfer level: Needs assistance Equipment used: Rolling walker (2 wheeled) Transfers: Sit to/from Stand Sit to Stand: Min guard         General transfer comment: sit to stand from the bed 2x and the commode 1x with min guard for balance. Pain reported in her feet   Ambulation/Gait Ambulation/Gait assistance: Min guard Gait Distance (Feet): 40 Feet(12'x2 8'x2) Assistive device: Rolling walker (2 wheeled) Gait Pattern/deviations: Step-to pattern;Ataxic Gait velocity: reduced   General Gait Details: Patient unssteady at first but better with the walker. She was able to walk to the door 2x with RW a distance of 12 feet and to the  bathroom 1x. She reported minor fatigue but mostly pain in her feet.    Stairs             Wheelchair Mobility    Modified Rankin (Stroke Patients Only)       Balance Overall balance assessment: Needs assistance Sitting-balance support: Single extremity supported;Feet supported Sitting balance-Leahy Scale: Fair Sitting balance - Comments: close supervision   Standing balance support: Single extremity supported Standing balance-Leahy Scale: Poor Standing balance comment: modA with unilateral hand hold                            Cognition Arousal/Alertness: Awake/alert Behavior During Therapy: WFL for tasks assessed/performed Overall Cognitive Status: Within Functional Limits for tasks assessed                                        Exercises      General Comments        Pertinent Vitals/Pain Pain Assessment: Faces Faces Pain Scale: Hurts even more Pain Location: both feet  Pain Descriptors / Indicators: Aching Pain Intervention(s): Limited activity within patient's tolerance;Monitored during session    Home Living                      Prior Function            PT Goals (current goals can now be found in the care plan section) Acute Rehab PT Goals Patient Stated  Goal: To return to independent mobility PT Goal Formulation: With patient Time For Goal Achievement: 09/09/19 Potential to Achieve Goals: Good Progress towards PT goals: Progressing toward goals    Frequency    Min 4X/week      PT Plan Equipment recommendations need to be updated    Co-evaluation              AM-PAC PT "6 Clicks" Mobility   Outcome Measure  Help needed turning from your back to your side while in a flat bed without using bedrails?: A Little Help needed moving from lying on your back to sitting on the side of a flat bed without using bedrails?: A Little Help needed moving to and from a bed to a chair (including a  wheelchair)?: A Little Help needed standing up from a chair using your arms (e.g., wheelchair or bedside chair)?: A Little Help needed to walk in hospital room?: A Lot Help needed climbing 3-5 steps with a railing? : Total 6 Click Score: 15    End of Session Equipment Utilized During Treatment: Gait belt Activity Tolerance: Patient tolerated treatment well Patient left: in bed;with call bell/phone within reach;with bed alarm set Nurse Communication: Mobility status PT Visit Diagnosis: Ataxic gait (R26.0)     Time: 0140-0203 PT Time Calculation (min) (ACUTE ONLY): 23 min  Charges:  $Gait Training: 8-22 mins $Therapeutic Activity: 8-22 mins            Carney Living PT DPT  08/27/2019, 3:46 PM

## 2019-08-28 LAB — BASIC METABOLIC PANEL
Anion gap: 8 (ref 5–15)
BUN: 13 mg/dL (ref 8–23)
CO2: 25 mmol/L (ref 22–32)
Calcium: 8.7 mg/dL — ABNORMAL LOW (ref 8.9–10.3)
Chloride: 109 mmol/L (ref 98–111)
Creatinine, Ser: 0.72 mg/dL (ref 0.44–1.00)
GFR calc Af Amer: >60 mL/min (ref >60)
GFR calc non Af Amer: >60 mL/min (ref >60)
Glucose, Bld: 90 mg/dL (ref 70–99)
Potassium: 3.6 mmol/L (ref 3.5–5.1)
Sodium: 142 mmol/L (ref 135–145)

## 2019-08-28 LAB — CBC
HCT: 37.7 % (ref 36.0–46.0)
Hemoglobin: 12.2 g/dL (ref 12.0–15.0)
MCH: 34.1 pg — ABNORMAL HIGH (ref 26.0–34.0)
MCHC: 32.4 g/dL (ref 30.0–36.0)
MCV: 105.3 fL — ABNORMAL HIGH (ref 80.0–100.0)
Platelets: 100 10*3/uL — ABNORMAL LOW (ref 150–400)
RBC: 3.58 MIL/uL — ABNORMAL LOW (ref 3.87–5.11)
RDW: 12.5 % (ref 11.5–15.5)
WBC: 3.3 10*3/uL — ABNORMAL LOW (ref 4.0–10.5)
nRBC: 0 % (ref 0.0–0.2)

## 2019-08-28 LAB — URINE CULTURE: Culture: >=100000 — AB

## 2019-08-28 LAB — PHENYTOIN LEVEL, TOTAL: Phenytoin Lvl: 14.9 ug/mL (ref 10.0–20.0)

## 2019-08-28 NOTE — Progress Notes (Signed)
Neurology Progress Note   S:// Patient seen and examined.  Still unsteady when she tries to walk.   O:// Current vital signs: BP (!) 100/54 (BP Location: Right Arm)   Pulse 71   Temp 98.3 F (36.8 C) (Oral)   Resp 18   Ht 5\' 7"  (1.702 m)   Wt 60.1 kg   SpO2 92%   BMI 20.75 kg/m  Vital signs in last 24 hours: Temp:  [98.1 F (36.7 C)-98.5 F (36.9 C)] 98.3 F (36.8 C) (01/24 0920) Pulse Rate:  [53-74] 71 (01/24 0920) Resp:  [16-20] 18 (01/24 0920) BP: (100-121)/(47-67) 100/54 (01/24 0920) SpO2:  [92 %-96 %] 92 % (01/24 0920) General: Sitting comfortably in bed in no distress HEENT: Cephalic atraumatic Respiratory: Nonlabored Abdomen: Soft nondistended Neurological exam Mental status: Awake alert and appropriately oriented. Cranial nerves: Pupil equal round react light, saccadic smooth pursuit but no nystagmus, face symmetric, left homonymous hemianopsia, auditory acuity intact, tongue and palate midline. Motor exam: Symmetric antigravity in all fours Sensory exam intact to light touch Cerebellar exam: There is mild ataxia in both upper extremities on finger-nose-finger testing. I have not walked her but her gait is ataxic according to the nurses.  Medications  Current Facility-Administered Medications:  .  acetaminophen (TYLENOL) tablet 650 mg, 650 mg, Oral, Q6H PRN, 650 mg at 08/28/19 0925 **OR** acetaminophen (TYLENOL) suppository 650 mg, 650 mg, Rectal, Q6H PRN, Smith, Rondell A, MD .  albuterol (PROVENTIL) (2.5 MG/3ML) 0.083% nebulizer solution 2.5 mg, 2.5 mg, Nebulization, Q6H PRN, Smith, Rondell A, MD .  alum & mag hydroxide-simeth (MAALOX/MYLANTA) 200-200-20 MG/5ML suspension 30 mL, 30 mL, Oral, Q6H PRN, Tamala Julian, Rondell A, MD, 30 mL at 08/26/19 1728 .  cefTRIAXone (ROCEPHIN) 1 g in sodium chloride 0.9 % 100 mL IVPB, 1 g, Intravenous, Q24H, Smith, Rondell A, MD, Last Rate: 200 mL/hr at 08/27/19 1147, 1 g at 08/27/19 1147 .  enoxaparin (LOVENOX) injection 40 mg, 40  mg, Subcutaneous, Q24H, Smith, Rondell A, MD, 40 mg at 08/27/19 1736 .  escitalopram (LEXAPRO) tablet 10 mg, 10 mg, Oral, Daily, Tamala Julian, Rondell A, MD, 10 mg at 08/28/19 0925 .  gabapentin (NEURONTIN) capsule 300 mg, 300 mg, Oral, QHS, Lora Havens, MD, 300 mg at 08/27/19 2102 .  lacosamide (VIMPAT) tablet 200 mg, 200 mg, Oral, BID, Lora Havens, MD, 200 mg at 08/28/19 0925 .  LORazepam (ATIVAN) injection 2 mg, 2 mg, Intravenous, PRN, Lora Havens, MD .  phenytoin (DILANTIN) ER capsule 100 mg, 100 mg, Oral, BID, Lora Havens, MD, 100 mg at 08/28/19 0925 .  sodium chloride flush (NS) 0.9 % injection 3 mL, 3 mL, Intravenous, Q12H, Smith, Rondell A, MD, 3 mL at 08/28/19 0926 Labs CBC    Component Value Date/Time   WBC 3.3 (L) 08/28/2019 0231   RBC 3.58 (L) 08/28/2019 0231   HGB 12.2 08/28/2019 0231   HCT 37.7 08/28/2019 0231   HCT 38.5 02/17/2017 0930   PLT 100 (L) 08/28/2019 0231   MCV 105.3 (H) 08/28/2019 0231   MCH 34.1 (H) 08/28/2019 0231   MCHC 32.4 08/28/2019 0231   RDW 12.5 08/28/2019 0231   RDW 12.9 06/10/2013 0812   LYMPHSABS 1.2 08/26/2019 0322   LYMPHSABS 1.4 06/10/2013 0812   MONOABS 0.3 08/26/2019 0322   EOSABS 0.0 08/26/2019 0322   EOSABS 0.0 06/10/2013 0812   BASOSABS 0.0 08/26/2019 0322   BASOSABS 0.0 06/10/2013 0812    CMP     Component Value Date/Time  NA 142 08/28/2019 0231   NA 145 (H) 06/10/2013 0812   K 3.6 08/28/2019 0231   CL 109 08/28/2019 0231   CO2 25 08/28/2019 0231   GLUCOSE 90 08/28/2019 0231   BUN 13 08/28/2019 0231   BUN 13 06/10/2013 0812   CREATININE 0.72 08/28/2019 0231   CALCIUM 8.7 (L) 08/28/2019 0231   PROT 6.9 08/26/2019 0322   PROT 6.1 06/10/2013 0812   ALBUMIN 4.3 08/26/2019 0322   ALBUMIN 4.5 06/10/2013 0812   AST 17 08/26/2019 0322   ALT 12 08/26/2019 0322   ALKPHOS 51 08/26/2019 0322   BILITOT 0.6 08/26/2019 0322   GFRNONAA >60 08/28/2019 0231   GFRAA >60 08/28/2019 0231   Imaging I have reviewed  images in epic and the results pertinent to this consultation are: MRI brain without contrast 08/26/2019-no acute abnormality, unchanged right parieto-occipital encephalomalacia.  Assessment: 64 year old woman with past medical history of seizures due to a right parieto-occipital AVM, brought in with worsening ataxia in the setting of new medication-Xcorpi -also found to have supratherapeutic Dilantin. In the past she has had supratherapeutic Dilantin levels with medication adjustments. Her current Dilantin level has been stable and normal for the past 2 days. I would expect her ataxia to get better with time. She is concerned that the new medication also might be contributing to her ataxia, but I would be reluctant to stop that medication given that she has difficult to control seizures.  I would defer that decision to outpatient neurology.  Recommendations: Continue Dilantin 100 mg twice daily Continue Vimpat 200 mg twice daily Continue gabapentin 300 mg at night Continue XCORPI 50 mg at night daily-titration per outpatient neurology. Also found to have a UTI-treatment per primary team PT/OT No new recommendations from a neurological standpoint. Please call us as needed.  -- Amie Portland, MD [facility] Pager: [phone] If 7pm to 7am, please call on call as listed on AMION.

## 2019-08-28 NOTE — Progress Notes (Signed)
Occupational Therapy Treatment Patient Details Name: [name] MRN: [id number] DOB: August 04, 1956 Today's Date: 08/28/2019    History of present illness 64 y.o. 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 Dx: likely Dilantin toxicity    OT comments  Pt. Seen for skilled OT treatment session. Limited participation today secondary to c/o of B foot pain and increased dizziness.  Completed LB dressing seated with min a.  Declined in room ambulation/functional mobility secondary to dizziness. Spoke with rn regarding pt. Reports of foot pain and dizziness.  Will continue adl progression at next treatment session as pt. Able to tolerate.    Follow Up Recommendations  CIR;Supervision/Assistance - 24 hour    Equipment Recommendations  3 in 1 bedside commode    Recommendations for Other Services Rehab consult    Precautions / Restrictions Precautions Precautions: Fall       Mobility Bed Mobility               General bed mobility comments: seated in recliner at beginning of session  Transfers Overall transfer level: Needs assistance Equipment used: Rolling walker (2 wheeled) Transfers: Sit to/from Stand Sit to Stand: Mod assist         General transfer comment: initially agreeable to amb. to b.room sit/stand x1 and reports increased dizziness, states "i dont really need to go anyway". assisted pt. to return to seated position and agreed limited session/no ambulation as pt. reports dizziness and increased B foot pain.    Balance                                           ADL either performed or assessed with clinical judgement   ADL Overall ADL's : Needs assistance/impaired                     Lower Body Dressing: Minimal  assistance;Sitting/lateral leans     Toilet Transfer Details (indicate cue type and reason): attempted with initial sit/stand from recliner.  pt. reports too dizzy for ambulation to b.room         Functional mobility during ADLs: Moderate assistance General ADL Comments: pt. reports having ambulated to/from b.room earlier today with staff assistance.  increased reports of dizziness and B foot pain limiting session as pt. did not feel safe to ambulate to b.room.     Vision       Perception     Praxis      Cognition Arousal/Alertness: Awake/alert Behavior During Therapy: WFL for tasks assessed/performed Overall Cognitive Status: Within Functional Limits for tasks assessed                                          Exercises     Shoulder Instructions       General Comments      Pertinent Vitals/ Pain       Pain Assessment: Faces Faces Pain Scale: Hurts whole lot Pain Location: both feet  Pain Descriptors / Indicators: Aching;Cramping  Home Living  Prior Functioning/Environment              Frequency  Min 2X/week        Progress Toward Goals  OT Goals(current goals can now be found in the care plan section)  Progress towards OT goals: Progressing toward goals     Plan Discharge plan remains appropriate    Co-evaluation                 AM-PAC OT "6 Clicks" Daily Activity     Outcome Measure   Help from another person eating meals?: None Help from another person taking care of personal grooming?: A Little Help from another person toileting, which includes using toliet, bedpan, or urinal?: A Lot Help from another person bathing (including washing, rinsing, drying)?: A Lot Help from another person to put on and taking off regular upper body clothing?: A Little Help from another person to put on and taking off regular lower body clothing?: A Lot 6 Click Score: 16     End of Session Equipment Utilized During Treatment: Rolling walker  OT Visit Diagnosis: Unsteadiness on feet (R26.81);Ataxia, unspecified (R27.0)   Activity Tolerance Patient limited by pain;Other (comment)(limited by dizziness)   Patient Left in chair;with call bell/phone within reach   Nurse Communication Other (comment)(reviewed with rn pt. c/o B foot pain and dizziness. rn already aware)        Time: [id number] OT Time Calculation (min): 12 min  Charges: OT General Charges $OT Visit: 1 Visit OT Treatments $Self Care/Home Management : 8-22 mins  Sonia Baller, COTA/L Acute Rehabilitation [phone]   Janice Coffin 08/28/2019, 12:47 PM

## 2019-08-28 NOTE — Progress Notes (Signed)
PROGRESS NOTE    [name]  [id number] DOB: 02-Apr-1956 DOA: 08/26/2019 PCP: [name] Gravel, MD   Brief Narrative:  HPI: [name] is a 64 y.o. 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  She presents with complaints of tremor and weakness after having a witnessed seizure last night.  Following the seizure she slept the rest of the day away.  This morning when she woke up she was still feeling tremulous and was unable to walk to the bathroom.  Reported associated symptoms of complaints of shortness of breath and mild burning with urination.  She was started on new medication of cenobamate last month by her.  She is followed in outpatient setting by [name] of [facility].  Patient notes that she is seizures in the past whenever her Dilantin levels were elevated.  At baseline she is able to ambulate without use of assistance.   ED Course: Upon admission into the emergency department patient was noted to be afebrile, respirations 13-25, and all other vital signs maintained.  Labs significant for WBC 3.4, platelets 124,  potassium 3.4, and phenytoin level 22.2.  Point-of-care COVID-19 screening was negative.  CT scan of the brain showed no acute abnormalities with unchanged encephalomalacia of the right parietal lobe.  CT angiogram of the chest did not show any signs of pulmonary embolus or pneumonia.  Urinalysis was positive for nitrites, small leukocytes, and rare bacteria.  Neurology was formally consulted and felt that symptoms likely could be secondary to elevated levels of Dilantin.  They agreed with MRI to ensure no signs of a stroke.   Assessment & Plan:   Principal Problem:   Ataxia Active Problems:   Seizure disorder (HCC)   Urinary tract infection associated with catheterization of urinary tract, initial encounter  (HCC)   Elevated Dilantin level   Ataxia/seizure disorder: Was found to have supratherapeutic Dilantin level.  Seen by neurologist.  AEDs adjusted.  Still with symptoms.  Seen by PT OT and the recommended CIR.  Evaluated by CIR coordinator and does not seem to be a candidate for CIR, per coordinator.  MRI brain unremarkable.  She still has some dizziness and she is still ataxic.  Seen by OT again and they once again recommended CIR.  Will need to touch base with CIR in the morning.  She is eager to go home however I have told her that she will be unsafe to discharge.  We will keep in the hospital and continue her current medications.  UTI: Urine culture growing Klebsiella which is sensitive to Rocephin.  We will continue this.  Complete 3 doses.  DVT prophylaxis: Lovenox Code Status: Full code Family Communication:  None present at bedside.  Plan of care discussed with patient in length and he verbalized understanding and agreed with it. Disposition Plan: Will be discharged to either CIR or home tomorrow.  Estimated body mass index is 20.75 kg/m as calculated from the following:   Height as of this encounter: 5\' 7"  (1.702 m).   Weight as of this encounter: 60.1 kg.      Nutritional status:               Consultants:   Neurology  Procedures:   None  Antimicrobials:   Rocephin   Subjective: Seen and examined.  Still feels dizzy and ataxic.  No other complaint.  Despite  of this, she is wanting to go home.  Objective: Vitals:   08/27/19 1759 08/27/19 2046 08/28/19 0455 08/28/19 0920  BP: 121/67 (!) 101/59 (!) 107/47 (!) 100/54  Pulse: 74 60 (!) 53 71  Resp: 18 20  18   Temp: 98.5 F (36.9 C) 98.1 F (36.7 C) 98.1 F (36.7 C) 98.3 F (36.8 C)  TempSrc: Oral Oral Oral Oral  SpO2: 95% 93% 96% 92%  Weight:      Height:        Intake/Output Summary (Last 24 hours) at 08/28/2019 1606 Last data filed at 08/28/2019 1205 Gross per 24 hour  Intake 1180 ml  Output  --  Net 1180 ml   Filed Weights   08/27/19 0500  Weight: 60.1 kg    Examination:  General exam: Appears calm and comfortable, upper body tremors which are slightly improved compared to yesterday. Respiratory system: Clear to auscultation. Respiratory effort normal. Cardiovascular system: S1 & S2 heard, RRR. No JVD, murmurs, rubs, gallops or clicks. No pedal edema. Gastrointestinal system: Abdomen is nondistended, soft and nontender. No organomegaly or masses felt. Normal bowel sounds heard. Central nervous system: Alert and oriented. No focal neurological deficits. Extremities: Symmetric 5 x 5 power. Skin: No rashes, lesions or ulcers.  Psychiatry: Judgement and insight appear normal. Mood & affect appropriate.    Data Reviewed: I have personally reviewed following labs and imaging studies  CBC: Recent Labs  Lab 08/26/19 0322 08/27/19 0434 08/28/19 0231  WBC 3.4* 3.3* 3.3*  NEUTROABS 1.8  --   --   HGB 14.1 12.6 12.2  HCT 43.3 38.4 37.7  MCV 103.8* 104.1* 105.3*  PLT 124* 114* [id number]*   Basic Metabolic Panel: Recent Labs  Lab 08/26/19 0322 08/27/19 0434 08/28/19 0231  NA 145 143 142  K 3.4* 4.1 3.6  CL 109 109 109  CO2 26 26 25   GLUCOSE 94 98 90  BUN 5* 9 13  CREATININE 0.97 0.87 0.72  CALCIUM 9.3 8.5* 8.7*  MG  --  2.0  --    GFR: Estimated Creatinine Clearance: 70.1 mL/min (by C-G formula based on SCr of 0.72 mg/dL). Liver Function Tests: Recent Labs  Lab 08/26/19 0322  AST 17  ALT 12  ALKPHOS 51  BILITOT 0.6  PROT 6.9  ALBUMIN 4.3   No results for input(s): LIPASE, AMYLASE in the last 168 hours. No results for input(s): AMMONIA in the last 168 hours. Coagulation Profile: No results for input(s): INR, PROTIME in the last 168 hours. Cardiac Enzymes: No results for input(s): CKTOTAL, CKMB, CKMBINDEX, TROPONINI in the last 168 hours. BNP (last 3 results) No results for input(s): PROBNP in the last 8760 hours. HbA1C: No results for input(s): HGBA1C  in the last 72 hours. CBG: No results for input(s): GLUCAP in the last 168 hours. Lipid Profile: No results for input(s): CHOL, HDL, LDLCALC, TRIG, CHOLHDL, LDLDIRECT in the last 72 hours. Thyroid Function Tests: No results for input(s): TSH, T4TOTAL, FREET4, T3FREE, THYROIDAB in the last 72 hours. Anemia Panel: No results for input(s): VITAMINB12, FOLATE, FERRITIN, TIBC, IRON, RETICCTPCT in the last 72 hours. Sepsis Labs: No results for input(s): PROCALCITON, LATICACIDVEN in the last 168 hours.  Recent Results (from the past 240 hour(s))  Urine Culture     Status: Abnormal   Collection Time: 08/26/19  5:10 AM   Specimen: Urine, Random  Result Value Ref Range Status   Specimen Description URINE, RANDOM  Final   Special Requests   Final  NONE Performed at [facility], [place] [address], [place], [place] [address]    Culture >=100,000 COLONIES/mL KLEBSIELLA PNEUMONIAE (A)  Final   Report Status 08/28/2019 FINAL  Final   Organism ID, Bacteria KLEBSIELLA PNEUMONIAE (A)  Final      Susceptibility   Klebsiella pneumoniae - MIC*    AMPICILLIN >=32 RESISTANT Resistant     CEFAZOLIN <=4 SENSITIVE Sensitive     CEFTRIAXONE <=0.25 SENSITIVE Sensitive     CIPROFLOXACIN <=0.25 SENSITIVE Sensitive     GENTAMICIN <=1 SENSITIVE Sensitive     IMIPENEM <=0.25 SENSITIVE Sensitive     NITROFURANTOIN <=16 SENSITIVE Sensitive     TRIMETH/SULFA <=20 SENSITIVE Sensitive     AMPICILLIN/SULBACTAM 4 SENSITIVE Sensitive     PIP/TAZO <=4 SENSITIVE Sensitive     * >=100,000 COLONIES/mL KLEBSIELLA PNEUMONIAE      Radiology Studies: No results found.  Scheduled Meds: . enoxaparin (LOVENOX) injection  40 mg Subcutaneous Q24H  . escitalopram  10 mg Oral Daily  . gabapentin  300 mg Oral QHS  . lacosamide  200 mg Oral BID  . phenytoin  100 mg Oral BID  . sodium chloride flush  3 mL Intravenous Q12H   Continuous Infusions: . cefTRIAXone (ROCEPHIN)  IV 1 g (08/28/19 1205)     LOS: 1 day    Time spent: 30 minutes   Darliss Cheney, MD [facility]  08/28/2019, 4:06 PM   To contact the attending provider between 7A-7P or the covering provider during after hours 7P-7A, please log into the web site [email or web address].

## 2019-08-29 MED ORDER — PHENYTOIN SODIUM EXTENDED 100 MG PO CAPS: 100.0000 mg | ORAL_CAPSULE | Freq: Every day | ORAL | 0 refills | Status: DC

## 2019-08-29 NOTE — Discharge Instructions (Signed)
Ataxia  Ataxia is a condition that causes unsteadiness when walking and standing, poor coordination of body movements, and difficulty keeping a straight (upright) posture. It occurs because of a problem with the part of the brain that controls coordination and stability (cerebellum). Ataxia can develop later in life (acquired ataxia), during your 20s or 30s or even into your 60s or later. This type of ataxia develops when another medical condition, such as a stroke, damages the cerebellum. Ataxia also may be present early in life (non-acquired ataxia). There are two main types of non-acquired ataxia:  Congenital. This type is present at birth.  Hereditary. This type is passed from parent to child. The most common form of hereditary non-acquired ataxia is Friedreich ataxia. What are the causes? Acquired ataxia may be caused by:  Changes in the nervous system (neurodegenerative changes).  Changes throughout the body (systemic disorders).  A lot of exposure to: ? Certain medicines such as phenytoin and lithium. ? Solvents. These are cleaning fluids such as paint thinner, nail polish remover, carpet cleaner, and degreasers.  Alcohol abuse (alcoholism).  Medical conditions, such as: ? Celiac disease. ? Hypothyroidism. ? A lack (deficiency) of vitamin E, vitamin B12, or thiamine. ? Brain tumors. ? Multiple sclerosis. ? Cerebral palsy. ? Stroke. ? Paraneoplastic syndromes. ? Viral infections. ? Head injury. ? Malnutrition. Congenital and hereditary ataxia are caused by problems that are present in genes before birth. What are the signs or symptoms? Signs and symptoms of ataxia vary depending on the cause. They may include:  Being unsteady.  Walking with the legs wide apart (wide stance) to keep one's balance.  Uncontrolled shaking (tremor).  Poorly coordinated body movements.  Difficulty maintaining an upright posture.  Fatigue.  Changes in speech.  Changes in  vision.  Involuntary eye movements (nystagmus).  Difficulty swallowing.  Difficulty writing.  Muscle tightening that you cannot control (muscle spasms). How is this diagnosed? Ataxia may be diagnosed based on:  Your personal and family medical history.  A physical exam.  Imaging tests, such as a CT scan or MRI.  Spinal tap (lumbar puncture). This procedure involves using a needle to take a sample of the fluid around your brain and spinal cord.  Genetic testing. How is this treated? The underlying condition that causes your ataxia needs to be treated. If the cause is a brain tumor, you may need surgery. Treatment also focuses on helping you live with ataxia and improving your quality of life (supportive treatments). This may involve:  Learning ways to improve coordination and move around more carefully (physical therapy).  Learning ways to improve your ability to do daily tasks, such as bathing and feeding yourself (occupational therapy).  Using devices to help you move around, eat, or communicate (assistive devices), such as a walker, modified eating utensils, and communication aids.  Learning ways to improve speech and swallowing (speech therapy). Follow these instructions at home: Preventing falls  Lie down right away if you become very unsteady, dizzy, or nauseous, or if you feel like you are going to faint. Do not get up until all of those feelings pass.  Keep your home well-lit. Use night-lights as needed.  Remove tripping hazards, such as rugs, cords, and clutter.  Install grab bars by the toilet and in the tub and shower.  Use assistive devices such as a cane, walker, or wheelchair as needed to keep your balance. General instructions  Do not drink alcohol.  Ask your health care provider what activities are safe  for you, and what activities you should avoid.  Take over-the-counter and prescription medicines only as told by your health care provider. Get help  right away if you:  Have unsteadiness that suddenly worsens.  Have any of these: ? Severe headaches. ? Chest pain. ? Abdominal pain. ? Weakness or numbness on one side of your body. ? Vision problems. ? Difficulty speaking. ? An irregular heartbeat. ? A very fast pulse.  Feel confused. Summary  Ataxia is a condition that causes unsteadiness when walking and standing, poor coordination of body movements, and difficulty keeping a straight (upright) posture.  Ataxia occurs because of a problem with the part of the brain that controls coordination and stability (cerebellum).  The underlying condition that causes your ataxia needs to be treated. Treatment also focuses on helping you live with ataxia and improving your quality of life (supportive treatments).  Lie down right away if you become very unsteady, dizzy, or nauseous, or if you feel like you are going to faint. This information is not intended to replace advice given to you by your health care provider. Make sure you discuss any questions you have with your health care provider. Document Revised: 07/03/2017 Document Reviewed: 05/22/2017 [employer] Patient Education  2020 [employer]  

## 2019-08-29 NOTE — Progress Notes (Signed)
Patient discharged home with family, discharge instructions given to patient education done on medication change. Patient verbalizes understanding. Patient had a new front wheel walker to go home.

## 2019-08-29 NOTE — Progress Notes (Signed)
Physical Therapy Treatment Patient Details Name: [name] MRN: [id number] DOB: 27-May-1956 Today's Date: 08/29/2019    History of Present Illness 64 y.o. 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 Was able to perform transfers and ambulation with gross min guard assist for balance support and safety and RW for support. Bilateral feet pain (?neuropathy) limiting ambulation distance, and we were not able to make it to the stairwell to initiate stair training this morning. Pt reports she will have her ex-mother-in-law 24 hours and her daughter intermittently to assist her at home. We discussed options for follow-up therapy at d/c and pt prefers return home with HHPT to follow. Given her improvement since evaluation, feel this would be manageable with consistent family support. Will continue to follow.    Follow Up Recommendations  Home health PT;Supervision for mobility/OOB     Equipment Recommendations  Rolling walker with 5" wheels    Recommendations for Other Services       Precautions / Restrictions Precautions Precautions: Fall Restrictions Weight Bearing Restrictions: No    Mobility  Bed Mobility Overal bed mobility: Needs Assistance Bed Mobility: Supine to Sit     Supine to sit: Supervision     General bed mobility comments: Supervision for safety. No assist required however pt very shaky and with increased time to scoot out fully to EOB.   Transfers Overall transfer level: Needs assistance Equipment used: Rolling walker (2 wheeled) Transfers: Sit to/from Stand Sit to Stand: Min guard         General transfer comment: Stood x2 from EOB. Once with  supervision for safety, and then with min guard assist as pt had a slight buckle with LE's due to pain - no assist to recover but hands on guarding provided for safety.   Ambulation/Gait Ambulation/Gait assistance: Min guard Gait Distance (Feet): 75 Feet Assistive device: Rolling walker (2 wheeled) Gait Pattern/deviations: Step-through pattern;Decreased stride length;Ataxic;Narrow base of support Gait velocity: Decreased Gait velocity interpretation: <1.8 ft/sec, indicate of risk for recurrent falls General Gait Details: Hnads-on guarding throughout gait training. Distance limited by bilateral feet pain. Appears shaky throughout.    Stairs             Wheelchair Mobility    Modified Rankin (Stroke Patients Only)       Balance Overall balance assessment: Needs assistance Sitting-balance support: Feet supported;No upper extremity supported Sitting balance-Leahy Scale: Fair     Standing balance support: Bilateral upper extremity supported;During functional activity Standing balance-Leahy Scale: Poor Standing balance comment: Reliant on UE support                            Cognition Arousal/Alertness: Awake/alert Behavior During Therapy: WFL for tasks assessed/performed Overall Cognitive Status: Within Functional Limits for tasks assessed                                        Exercises      General Comments        Pertinent Vitals/Pain Pain Assessment: Faces Faces Pain Scale: Hurts even more Pain Location: both feet  Pain Descriptors / Indicators: Cramping;Tightness;Burning Pain Intervention(s): Limited activity within patient's tolerance;Monitored during session;Repositioned    Home Living                      Prior Function            PT Goals (current goals can now be found in the care plan section) Acute Rehab PT Goals Patient Stated Goal: Home today PT Goal Formulation: With patient Time For Goal Achievement:  09/09/19 Potential to Achieve Goals: Good Progress towards PT goals: Progressing toward goals    Frequency    Min 4X/week      PT Plan Discharge plan needs to be updated    Co-evaluation              AM-PAC PT "6 Clicks" Mobility   Outcome Measure  Help needed turning from your back to your side while in a flat bed without using bedrails?: None Help needed moving from lying on your back to sitting on the side of a flat bed without using bedrails?: None Help needed moving to and from a bed to a chair (including a wheelchair)?: A Little Help needed standing up from a chair using your arms (e.g., wheelchair or bedside chair)?: A Little Help needed to walk in hospital room?: A Little Help needed climbing 3-5 steps with a railing? : A Little 6 Click Score: 20    End of Session Equipment Utilized During Treatment: Gait belt Activity Tolerance: Patient tolerated treatment well Patient left: in chair;with call bell/phone within reach Nurse Communication: Mobility status PT Visit Diagnosis: Ataxic gait (R26.0)     Time: [id number] PT Time Calculation (min) (ACUTE ONLY): 21 min  Charges:  $Gait Training: 8-22 mins                     [name], PT, DPT Acute Rehabilitation Services Pager: [phone] Office: [phone]    [name] 08/29/2019, 10:14 AM

## 2019-08-29 NOTE — Discharge Summary (Signed)
Physician Discharge Summary  [name] [id number] DOB: January 24, 1956 DOA: 08/26/2019  PCP: [name] Gravel, MD  Admit date: 08/26/2019 Discharge date: 08/29/2019  Admitted From: Home Disposition: Home  Recommendations for Outpatient Follow-up:  1. Follow up with PCP in 1-2 weeks 2. Follow with your neurologist in 1 week 3. Please obtain BMP/CBC in one week 4. Please follow up on the following pending results:  Home Health: Yes Equipment/Devices: Rolling walker  Discharge Condition: Stable CODE STATUS: Full code Diet recommendation: Cardiac  Subjective: Seen and examined.  She states that she feels much better.  Vertigo is improved but she understands that she is weak and that is why she has some symptoms but she wants to go home and feels comfortable with that.  She also has chronic peripheral neuropathy.  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 She presentedwith complaints of tremor and weakness after having awitnessed seizure last night. Following the seizure she slept the rest of the day away. This morning on 08/26/2019 when she woke up she was still feeling tremulous and was unable to walk to the bathroom. Reported associated symptoms of complaints of shortness of breath and mild burning with urination.She was started on new medication ofcenobamatelast month by her. She is followed in outpatient setting by [name].Patient notes that she had seizures in the past whenever her Dilantin levels were elevated. At baseline she is able to ambulate without use of assistance.  Upon admission into the emergency department patient was noted to be afebrile, respirations 13-25, and all other vital signs maintained. Labs significant for WBC 3.4, platelets  124, potassium 3.4, and phenytoin level 22.2. Point-of-care COVID-19 screening was negative.CT scan of the brain showed no acute abnormalities with unchanged encephalomalacia of the right parietal lobe. CT angiogram of the chest did not show any signs of pulmonary embolus or pneumonia. Urinalysis was positive for nitrites, small leukocytes, and rare bacteria. Neurology was formally consulted and felt that symptoms likely could be secondary to elevated levels of Dilantin. They agreed with MRI toensure no signs of a stroke. She was admitted under hospital service.  MRI was unremarkable for any stroke.  Her Dilantin was reduced from 130 mg daily to 100 mg daily and rest of her AEDs were continued.  Spot EEG was obtained which was negative.  She was started on Rocephin for UTI.  Subsequently her urine culture grew Klebsiella which was sensitive to Rocephin.  She completed 3 doses with the last 1 being yesterday.  She was then evaluated by PT OT who initially recommended CIR.  She was evaluated by CIR coordinator who thought that patient was not a candidate for CIR since her symptoms thought to be temporary.  She was seen by PT OT on daily basis.  Over the course of last 48 hours, her symptoms have improved significantly.  She has very minimal dizziness which is chronic for her however her ataxia has improved significantly.  She has chronic and severe peripheral neuropathy for which she takes gabapentin 300 mg at night.  She tells me that she feels that the medication is not working for her.  I advised her to follow-up with her neurologist to discuss this with them to either increase the dose of gabapentin or switching to Lyrica.  She has been cleared by neurology and PT and she also wants to go home so  she is going to be discharged home in stable condition with follow-up with PCP as well as neurology within 1 week.  Discharge Diagnoses:  Principal Problem:   Ataxia Active Problems:   Seizure disorder  ([place])   Urinary tract infection associated with catheterization of urinary tract, initial encounter ([place])   Elevated Dilantin level    Discharge Instructions  Discharge Instructions    Discharge patient   Complete by: As directed    Discharge disposition: 06-Home-Health Care Svc   Discharge patient date: 08/29/2019     Allergies as of 08/29/2019      Reactions   Zonisamide Other (See Comments)   Numbness and tingling over whole body   Chocolate Flavor Other (See Comments)   Trigger for Seizure   Codeine Nausea And Vomiting   Keppra [levetiracetam] Other (See Comments)   Causes seizures   Pregabalin Nausea And Vomiting   Olive Oil Rash      Medication List    TAKE these medications   acetaminophen 500 MG tablet Commonly known as: TYLENOL Take 1,000 mg by mouth every 6 (six) hours as needed for mild pain.   B-12 1000 MCG Tabs Take 1,000 mcg by mouth daily.   cholecalciferol 25 MCG (1000 UNIT) tablet Commonly known as: VITAMIN D3 Take 1,000 Units by mouth 2 (two) times daily.   escitalopram 10 MG tablet Commonly known as: Lexapro Take 1 tablet (10 mg total) by mouth daily.   gabapentin 300 MG capsule Commonly known as: NEURONTIN Take 1 capsule every night   lacosamide 200 MG Tabs tablet Commonly known as: VIMPAT Take 1 tablet (200 mg total) by mouth 2 (two) times daily.   LORazepam 1 MG tablet Commonly known as: Ativan Take 1 tablet as needed for seizure. Do not take more than 2 in 24 hours.   phenytoin 100 MG ER capsule Commonly known as: DILANTIN Take 1 capsule (100 mg total) by mouth daily. What changed:   how much to take  how to take this  when to take this  additional instructions  Another medication with the same name was removed. Continue taking this medication, and follow the directions you see here.   Xcopri 14 x 50 MG & 14 x100 MG Tbpk Generic drug: Cenobamate Take 1 tablet by mouth at bedtime.            Durable Medical  Equipment  (From admission, onward)         Start     Ordered   08/29/19 1023  For home use only DME Walker rolling  Once    Question Answer Comment  Walker: With [employer] Wheels   Patient needs a walker to treat with the following condition Ataxia      08/29/19 1023         Follow-up Information    [name] Gravel, MD Follow up in 1 week(s).   Specialty: Internal Medicine Contact information: [address] [place] [address] [phone]          Allergies  Allergen Reactions  . Zonisamide Other (See Comments)    Numbness and tingling over whole body  . Chocolate Flavor Other (See Comments)    Trigger for Seizure  . Codeine Nausea And Vomiting  . Keppra [Levetiracetam] Other (See Comments)    Causes seizures  . Pregabalin Nausea And Vomiting  . Olive Oil Rash    Consultations: Neurology   Procedures/Studies: CT Head Wo Contrast  Result Date: 08/26/2019 CLINICAL DATA:  Focal neuro deficit, > 6 hrs, stroke suspected EXAM: CT HEAD WITHOUT CONTRAST TECHNIQUE: Contiguous axial images were obtained from the base of the skull through the vertex without intravenous contrast. COMPARISON:  Head CT 07/16/2018 FINDINGS: Brain: Stable large area of encephalomalacia in the right parietooccipital lobe with associated ex vacuo dilatation of the right lateral ventricle. No hemorrhage. No evidence of acute ischemia. Stable atrophy. No subdural or extra-axial collection. Basilar cisterns are patent. No hydrocephalus. Vascular: Atherosclerosis of skullbase vasculature without hyperdense vessel or abnormal calcification. Skull: Right craniotomy, craniotomy flap no fracture or acute abnormality. Unchanged in alignment from prior. Sinuses/Orbits: Chronic opacification of lower left mastoid air cells. No acute findings. Other: None. IMPRESSION: 1. No acute intracranial abnormality. 2. Unchanged encephalomalacia in the right parietooccipital lobe with remote craniotomy.  Electronically Signed   By: Keith Rake M.D.   On: 08/26/2019 05:47   CT Angio Chest PE W and/or Wo Contrast  Result Date: 08/26/2019 CLINICAL DATA:  Shortness of breath. Seizures. EXAM: CT ANGIOGRAPHY CHEST WITH CONTRAST TECHNIQUE: Multidetector CT imaging of the chest was performed using the standard protocol during bolus administration of intravenous contrast. Multiplanar CT image reconstructions and MIPs were obtained to evaluate the vascular anatomy. CONTRAST:  69mL OMNIPAQUE IOHEXOL 350 MG/ML SOLN COMPARISON:  05/23/2018 FINDINGS: Cardiovascular: Pulmonary arterial opacification is adequate without evidence of emboli. There is mild thoracic aortic atherosclerosis without aneurysm. The heart is normal in size. There is no pericardial effusion. Mediastinum/Nodes: No enlarged axillary, mediastinal, or hilar lymph nodes. Unremarkable thyroid and esophagus. Lungs/Pleura: No pleural effusion or pneumothorax. Centrilobular emphysema. Unchanged scattered small lung nodules with the largest measuring 4 mm in the right upper lobe (series 6, image 96). Mild dependent atelectasis in the right greater than left lower lobes. Mild scarring in the right middle lobe. Upper Abdomen: No acute abnormality. Musculoskeletal: No acute osseous abnormality or suspicious osseous lesion. Thoracic spondylosis. Old rib fractures. Review of the MIP images confirms the above findings. IMPRESSION: 1. No evidence of pulmonary emboli or other acute abnormality in the chest. 2. Aortic Atherosclerosis (ICD10-I70.0) and Emphysema (ICD10-J43.9). Electronically Signed   By: Logan Bores M.D.   On: 08/26/2019 07:50   MR BRAIN WO CONTRAST  Result Date: 08/26/2019 CLINICAL DATA:  Seizures. Ataxia. EXAM: MRI HEAD WITHOUT CONTRAST TECHNIQUE: Multiplanar, multiecho pulse sequences of the brain and surrounding structures were obtained without intravenous contrast. COMPARISON:  Head CT 08/26/2019 and MRI 05/31/2018 FINDINGS: The study is  mildly motion degraded. Brain: There is no evidence of acute infarct, intracranial hemorrhage, mass, midline shift, or extra-axial fluid collection. A large area of right parieto-occipital encephalomalacia is again noted communicating with a dilated right lateral ventricle, unchanged from the prior MRI. Minimal T2 FLAIR hyperintensity in the white matter adjacent to the left lateral ventricle is unchanged and nonspecific but compatible with chronic small vessel ischemia. There is chronic asymmetric right hippocampal volume loss. Vascular: Major intracranial vascular flow voids are preserved. Skull and upper cervical spine: Right parieto-occipital craniotomy. No suspicious marrow lesion. Sinuses/Orbits: Unremarkable orbits. Clear paranasal sinuses. Chronic left mastoid effusion. Other: None. IMPRESSION: 1. No acute intracranial abnormality. 2. Unchanged right parieto-occipital encephalomalacia. Electronically Signed   By: Logan Bores M.D.   On: 08/26/2019 13:37   DG Chest Portable 1 View  Result Date: 08/26/2019 CLINICAL DATA:  Shortness of breath EXAM: PORTABLE CHEST 1 VIEW COMPARISON:  CT angiogram chest August 26, 2019; chest radiograph May 29, 2018 FINDINGS: There is mild bibasilar atelectasis. No edema or consolidation. Heart size  and pulmonary vascularity are normal. No adenopathy. There is aortic atherosclerosis. No bone lesions. IMPRESSION: Bibasilar atelectasis. Lungs elsewhere clear. Cardiac silhouette normal. No adenopathy. Aortic Atherosclerosis (ICD10-I70.0). Electronically Signed   By: Lowella Grip III M.D.   On: 08/26/2019 07:59      Discharge Exam: Vitals:   08/28/19 2253 08/29/19 0617  BP: (!) 121/45 (!) 114/51  Pulse:  65  Resp: 16 17  Temp: 98.3 F (36.8 C) 98 F (36.7 C)  SpO2: 96% 98%   Vitals:   08/28/19 0920 08/28/19 1814 08/28/19 2253 08/29/19 0617  BP: (!) 100/54 (!) 103/58 (!) 121/45 (!) 114/51  Pulse: 71 68  65  Resp: 18 18 16 17   Temp: 98.3 F (36.8 C)  98.4 F (36.9 C) 98.3 F (36.8 C) 98 F (36.7 C)  TempSrc: Oral Oral Oral Oral  SpO2: 92% 96% 96% 98%  Weight:      Height:        General: Pt is alert, awake, not in acute distress Cardiovascular: RRR, S1/S2 +, no rubs, no gallops Respiratory: CTA bilaterally, no wheezing, no rhonchi Abdominal: Soft, NT, ND, bowel sounds + Extremities: no edema, no cyanosis    The results of significant diagnostics from this hospitalization (including imaging, microbiology, ancillary and laboratory) are listed below for reference.     Microbiology: Recent Results (from the past 240 hour(s))  Urine Culture     Status: Abnormal   Collection Time: 08/26/19  5:10 AM   Specimen: Urine, Random  Result Value Ref Range Status   Specimen Description URINE, RANDOM  Final   Special Requests   Final    NONE Performed at [facility], [address], [place], [place] [address]    Culture >=100,000 COLONIES/mL KLEBSIELLA PNEUMONIAE (A)  Final   Report Status 08/28/2019 FINAL  Final   Organism ID, Bacteria KLEBSIELLA PNEUMONIAE (A)  Final      Susceptibility   Klebsiella pneumoniae - MIC*    AMPICILLIN >=32 RESISTANT Resistant     CEFAZOLIN <=4 SENSITIVE Sensitive     CEFTRIAXONE <=0.25 SENSITIVE Sensitive     CIPROFLOXACIN <=0.25 SENSITIVE Sensitive     GENTAMICIN <=1 SENSITIVE Sensitive     IMIPENEM <=0.25 SENSITIVE Sensitive     NITROFURANTOIN <=16 SENSITIVE Sensitive     TRIMETH/SULFA <=20 SENSITIVE Sensitive     AMPICILLIN/SULBACTAM 4 SENSITIVE Sensitive     PIP/TAZO <=4 SENSITIVE Sensitive     * >=100,000 COLONIES/mL KLEBSIELLA PNEUMONIAE     Labs: BNP (last 3 results) Recent Labs    08/26/19 0322  BNP [id number]   Basic Metabolic Panel: Recent Labs  Lab 08/26/19 0322 08/27/19 0434 08/28/19 0231  NA 145 143 142  K 3.4* 4.1 3.6  CL 109 109 109  CO2 26 26 25   GLUCOSE 94 98 90  BUN 5* 9 13  CREATININE 0.97 0.87 0.72  CALCIUM 9.3 8.5* 8.7*  MG  --  2.0  --    Liver  Function Tests: Recent Labs  Lab 08/26/19 0322  AST 17  ALT 12  ALKPHOS 51  BILITOT 0.6  PROT 6.9  ALBUMIN 4.3   No results for input(s): LIPASE, AMYLASE in the last 168 hours. No results for input(s): AMMONIA in the last 168 hours. CBC: Recent Labs  Lab 08/26/19 0322 08/27/19 0434 08/28/19 0231  WBC 3.4* 3.3* 3.3*  NEUTROABS 1.8  --   --   HGB 14.1 12.6 12.2  HCT 43.3 38.4 37.7  MCV 103.8* 104.1*  105.3*  PLT 124* 114* 100*   Cardiac Enzymes: No results for input(s): CKTOTAL, CKMB, CKMBINDEX, TROPONINI in the last 168 hours. BNP: Invalid input(s): POCBNP CBG: No results for input(s): GLUCAP in the last 168 hours. D-Dimer No results for input(s): DDIMER in the last 72 hours. Hgb A1c No results for input(s): HGBA1C in the last 72 hours. Lipid Profile No results for input(s): CHOL, HDL, LDLCALC, TRIG, CHOLHDL, LDLDIRECT in the last 72 hours. Thyroid function studies No results for input(s): TSH, T4TOTAL, T3FREE, THYROIDAB in the last 72 hours.  Invalid input(s): FREET3 Anemia work up No results for input(s): VITAMINB12, FOLATE, FERRITIN, TIBC, IRON, RETICCTPCT in the last 72 hours. Urinalysis    Component Value Date/Time   COLORURINE YELLOW 08/26/2019 0510   APPEARANCEUR CLEAR 08/26/2019 0510   LABSPEC 1.002 (L) 08/26/2019 0510   PHURINE 7.0 08/26/2019 0510   GLUCOSEU NEGATIVE 08/26/2019 0510   HGBUR NEGATIVE 08/26/2019 0510   BILIRUBINUR NEGATIVE 08/26/2019 0510   KETONESUR NEGATIVE 08/26/2019 0510   PROTEINUR NEGATIVE 08/26/2019 0510   UROBILINOGEN 0.2 01/17/2015 2350   NITRITE POSITIVE (A) 08/26/2019 0510   LEUKOCYTESUR SMALL (A) 08/26/2019 0510   Sepsis Labs Invalid input(s): PROCALCITONIN,  WBC,  LACTICIDVEN Microbiology Recent Results (from the past 240 hour(s))  Urine Culture     Status: Abnormal   Collection Time: 08/26/19  5:10 AM   Specimen: Urine, Random  Result Value Ref Range Status   Specimen Description URINE, RANDOM  Final   Special  Requests   Final    NONE Performed at [facility], [address], [place],  [address]    Culture >=100,000 COLONIES/mL KLEBSIELLA PNEUMONIAE (A)  Final   Report Status 08/28/2019 FINAL  Final   Organism ID, Bacteria KLEBSIELLA PNEUMONIAE (A)  Final      Susceptibility   Klebsiella pneumoniae - MIC*    AMPICILLIN >=32 RESISTANT Resistant     CEFAZOLIN <=4 SENSITIVE Sensitive     CEFTRIAXONE <=0.25 SENSITIVE Sensitive     CIPROFLOXACIN <=0.25 SENSITIVE Sensitive     GENTAMICIN <=1 SENSITIVE Sensitive     IMIPENEM <=0.25 SENSITIVE Sensitive     NITROFURANTOIN <=16 SENSITIVE Sensitive     TRIMETH/SULFA <=20 SENSITIVE Sensitive     AMPICILLIN/SULBACTAM 4 SENSITIVE Sensitive     PIP/TAZO <=4 SENSITIVE Sensitive     * >=100,000 COLONIES/mL KLEBSIELLA PNEUMONIAE     Time coordinating discharge: Over 30 minutes  SIGNED:   Darliss Cheney, MD  [facility] 08/29/2019, 10:26 AM  If 7PM-7AM, please contact night-coverage [email or web address] Password [email or web address]

## 2019-08-29 NOTE — Plan of Care (Signed)

## 2019-09-02 ENCOUNTER — Ambulatory Visit: Payer: Medicare HMO | Admitting: Neurology

## 2019-09-02 ENCOUNTER — Encounter: Payer: Self-pay | Admitting: *Deleted

## 2019-09-02 ENCOUNTER — Telehealth: Payer: Self-pay | Admitting: Neurology

## 2019-09-02 ENCOUNTER — Other Ambulatory Visit: Payer: Self-pay

## 2019-09-02 DIAGNOSIS — R569 Unspecified convulsions: Secondary | ICD-10-CM | POA: Diagnosis not present

## 2019-09-02 NOTE — Telephone Encounter (Signed)
Patient came in needing to get a refill on her X copri medication. She is out of her medication. [facility] uses [facility] [address]. Thank you

## 2019-09-02 NOTE — Progress Notes (Signed)
[name] (Key: [id number]) Briviact 25MG  tablets   Form [employer] Electronic PA Form Created 15 days ago Sent to Plan 8 days ago Plan Response 8 days ago Submit Clinical Questions 8 days ago Determination Favorable 8 days ago Message from Plan PA Case: [id number], Status: Approved, Coverage Starts on: 08/05/2019 12:00:00 AM, Coverage Ends on: 08/03/2020 12:00:00 AM. Questions? Contact [phone].

## 2019-09-07 ENCOUNTER — Ambulatory Visit: Payer: Medicare HMO | Admitting: Neurology

## 2019-09-07 ENCOUNTER — Telehealth: Payer: Self-pay

## 2019-09-07 NOTE — Telephone Encounter (Signed)
Pt called to see what is going on no answer unable to leave voice mail, will mail out a letter to pt, and to let her know that our office has a no-show policy that we would not be able to see her after several no-shows. I can do one more visit with her, but if she misses that, we would not be able to see her any longer.  Dr Delice Lesch has a 3:30pm on Wed next week Feb 10, in-person visit open if she would like.

## 2019-09-09 DIAGNOSIS — Z1159 Encounter for screening for other viral diseases: Secondary | ICD-10-CM | POA: Diagnosis not present

## 2019-09-09 DIAGNOSIS — D693 Immune thrombocytopenic purpura: Secondary | ICD-10-CM | POA: Diagnosis not present

## 2019-09-09 DIAGNOSIS — N399 Disorder of urinary system, unspecified: Secondary | ICD-10-CM | POA: Diagnosis not present

## 2019-09-09 DIAGNOSIS — R569 Unspecified convulsions: Secondary | ICD-10-CM | POA: Diagnosis not present

## 2019-09-12 ENCOUNTER — Encounter: Payer: Self-pay | Admitting: Neurology

## 2019-09-12 ENCOUNTER — Telehealth: Payer: Self-pay | Admitting: Neurology

## 2019-09-12 NOTE — Telephone Encounter (Signed)
Spoke with pt daughter, [name] was having seizures every day, stated that she had one on sat, pt daughter stated unsure if pt needs to have refills on any medications said she would look when she got home, pt daughter is asking for a letter for [name] to get her out of [name] duty in march,

## 2019-09-12 NOTE — Telephone Encounter (Signed)
Patient's daughter called needing to reschedule her appointment due to missing it due to having Seizures all last week. She is rescheduled for 09/30/19. Also, her daughter [name] is needing a letter to excuse her from [employer] which is scheduled for 10/28/19. Please Call. Thank you

## 2019-09-12 NOTE — Telephone Encounter (Signed)
When daughter calls back, need to confirm how she is taking all her medications, especially the Dilantin and Xcopri. Will write jury duty letter. Thanks

## 2019-09-13 ENCOUNTER — Other Ambulatory Visit: Payer: Self-pay | Admitting: Neurology

## 2019-09-13 MED ORDER — LORAZEPAM 1 MG PO TABS: ORAL_TABLET | ORAL | 5 refills | Status: DC

## 2019-09-13 MED ORDER — XCOPRI 100 MG PO TABS: 1.0000 | ORAL_TABLET | Freq: Every day | ORAL | 5 refills | Status: DC

## 2019-09-13 NOTE — Telephone Encounter (Signed)
Pt called unable to leave voice mail

## 2019-09-13 NOTE — Telephone Encounter (Signed)
Pt was in the hospital last week when she missed her appointment, she asked if that no show could not count against her she got her letter today.  Week prior to her appointment she was also at the hospital they decreased her dilantin which caused her back to back seizures she was on 130 mg they decreased her to 100mg  pt increased herself back up to the 130 mg pt stated that she is taken all of her medications she takes them at 9 and 9.  She says she has 2 home health nurses that come out to help her.  [facility] and she is almost out she said she has a few 100mg  left. Asking how can she get the xcopri she doesn't want to run out

## 2019-09-13 NOTE — Telephone Encounter (Signed)
Pls let her know that the [name] can make her Dilantin level go up, please have her reduce Dilantin just a little to 100mg  in AM, 130mg  in PM. I will [facility] in [facility]mg  [facility]. Continue Vimpat 200mg  BID. Does she still have the Ativan to take as rescue medication to take after a seizure so she is not having them back to back? Thanks

## 2019-09-13 NOTE — Telephone Encounter (Signed)
Left message with the after hour service on 09-13-19 @ 12:29 pm   Returning a call to [facility]

## 2019-09-13 NOTE — Telephone Encounter (Signed)
Tried calling pt back with no answer ,

## 2019-09-14 MED ORDER — PHENYTOIN SODIUM EXTENDED 100 MG PO CAPS: ORAL_CAPSULE | ORAL | 0 refills | Status: DC

## 2019-09-14 NOTE — Telephone Encounter (Signed)
Pt informed that the [name] can make her Dilantin level go up,  1. reduce Dilantin just a little to 100mg  in AM, 130mg  in PM.  2. [name] will send in Xcopri 100mg  [facility].  3. Continue Vimpat 200mg  BID.  4. Does she still have the Ativan to take as rescue medication to take after a seizure so she is not having them back to back? Pt does still have the ativan for back to back seizures. pt verbalized understanding stated she will see [place] at her appointment on Friday

## 2019-09-15 DIAGNOSIS — Z5181 Encounter for therapeutic drug level monitoring: Secondary | ICD-10-CM | POA: Diagnosis not present

## 2019-09-15 IMAGING — DX DG SCAPULA*R*
2 series · 2 of 2 positions shown · non-contrast
Comparison: Same day

CLINICAL DATA: Seizure.  Fall with right-sided pain.

EXAM:
RIGHT SCAPULA - 2+ VIEWS

[t scapula ap right]
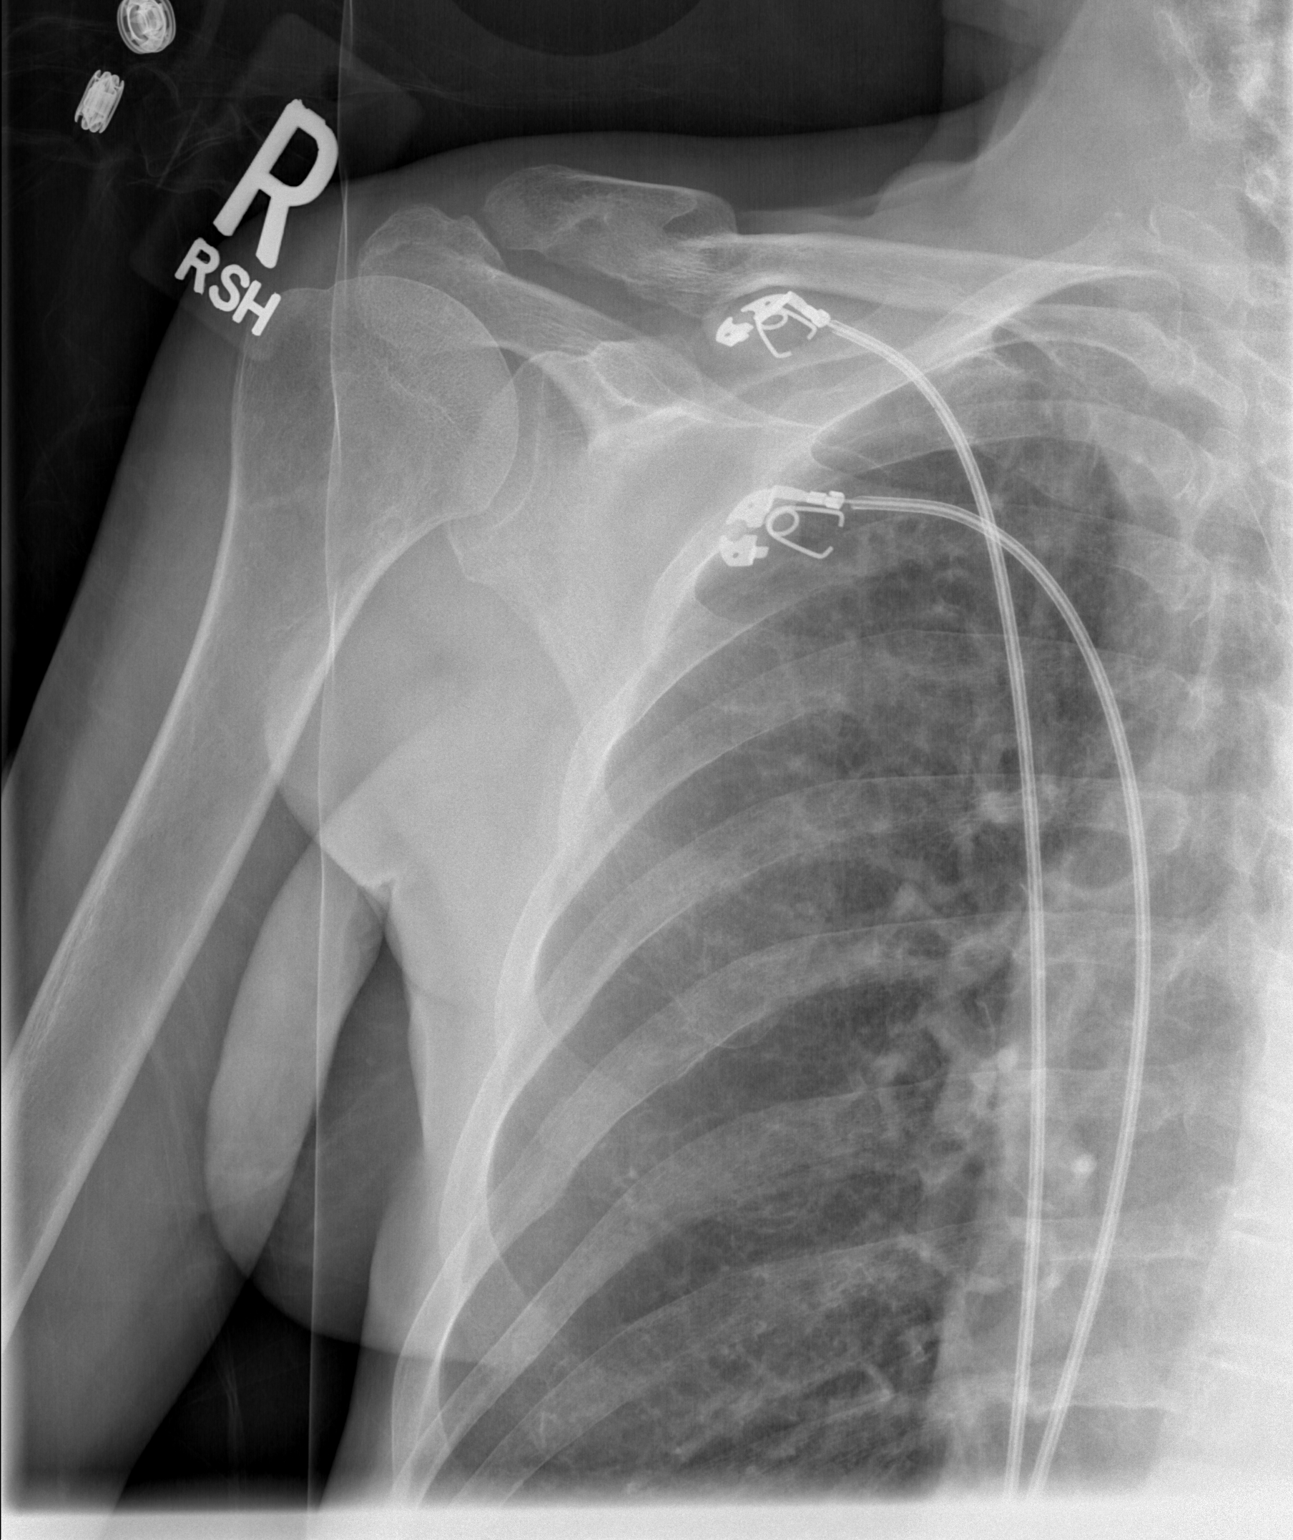

[t scapula y-view right]
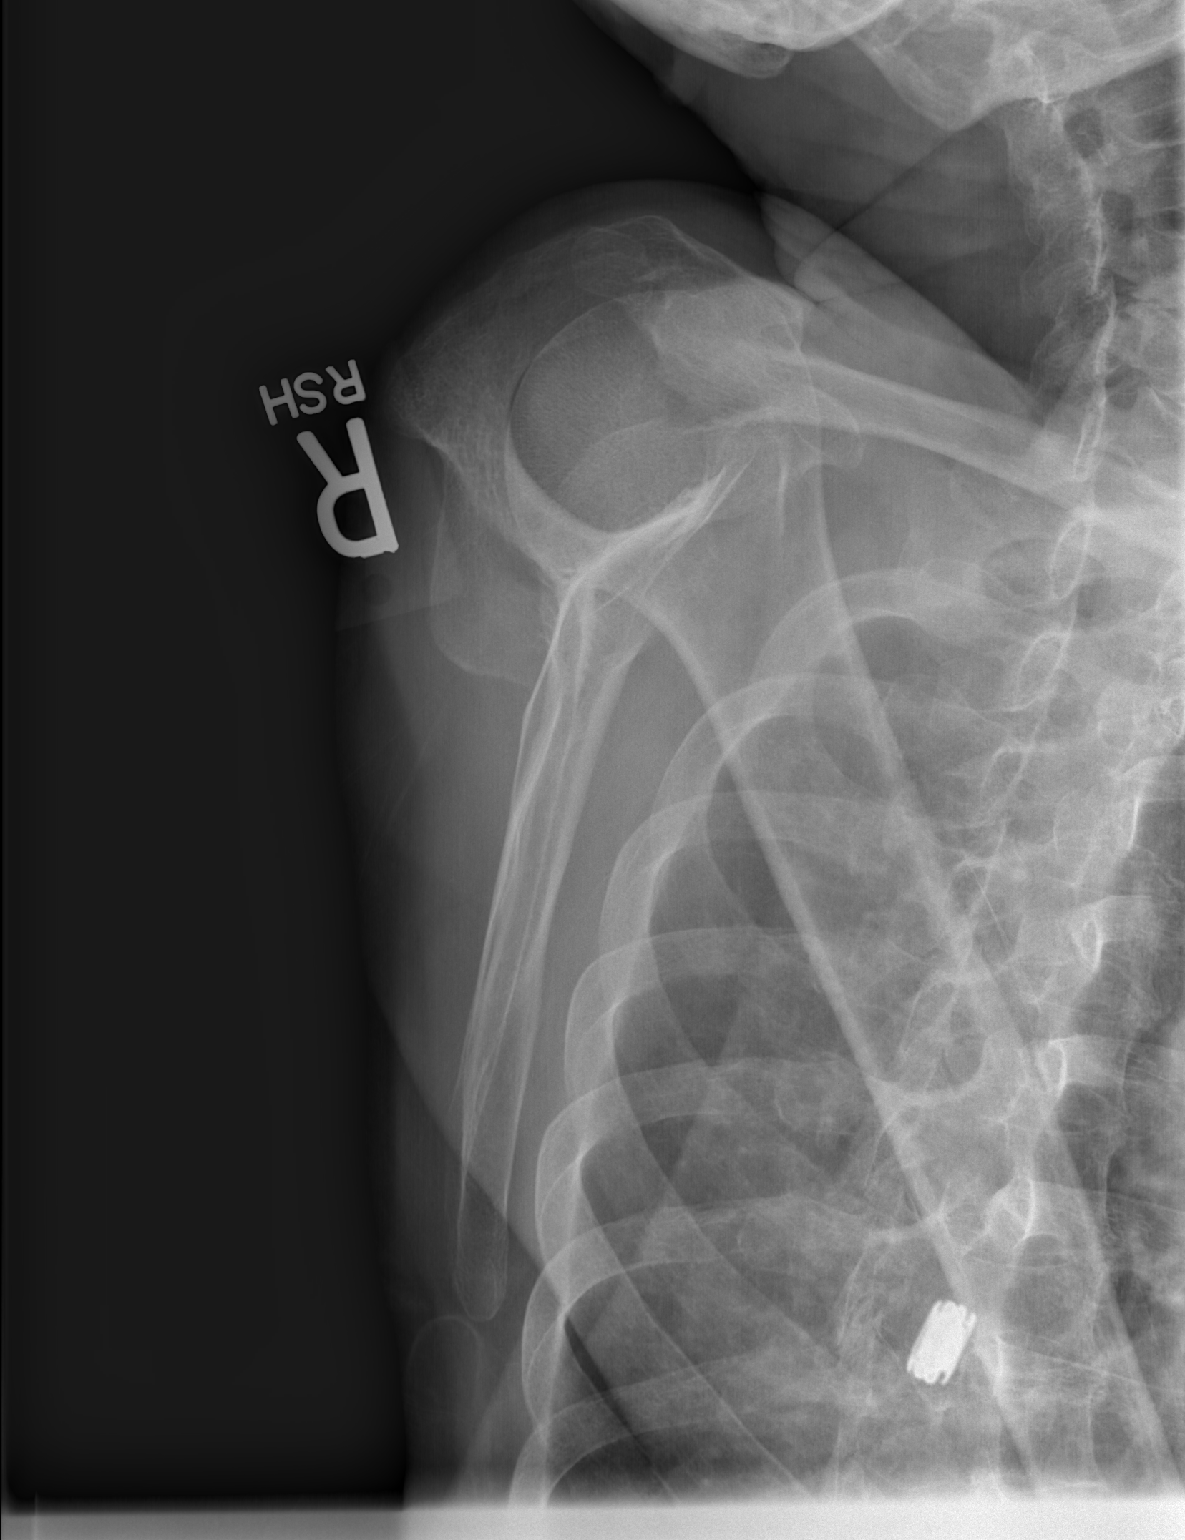

[2 of 2 positions shown; findings below may reference images not displayed]

FINDINGS: No evidence of acute clavicle fracture. Old healed clavicle
fracture. No evidence of acute scapular fracture. Glenohumeral joint
appears normal. No regional rib abnormality seen. Old healed
fracture of rib 7.
IMPRESSION: No acute finding.

## 2019-09-15 IMAGING — CT CT CERVICAL SPINE W/O CM
3 of 4 series · 12 of 33 positions shown, 14 images · non-contrast
Comparison: Radiography 11/02/2015

CLINICAL DATA: Seizure with subsequent neck pain and back pain.

EXAM:
CT CERVICAL SPINE WITHOUT CONTRAST
TECHNIQUE: Multidetector CT imaging of the cervical spine was performed without
intravenous contrast. Multiplanar CT image reconstructions were also
generated.

[Series 6: sag bone · sagittal · 0.21mm/px · 5 of 61 slices shown, 6 images]
[im 21/61  bone]
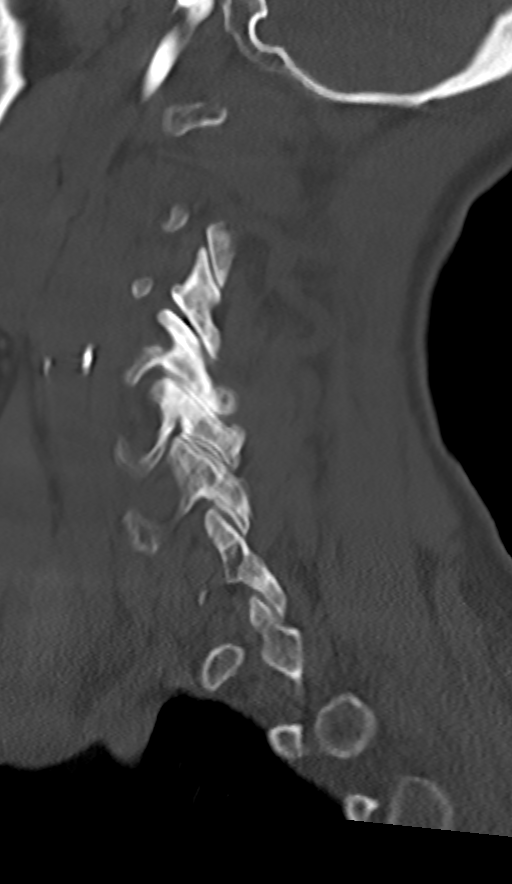
[im 26/61  bone]
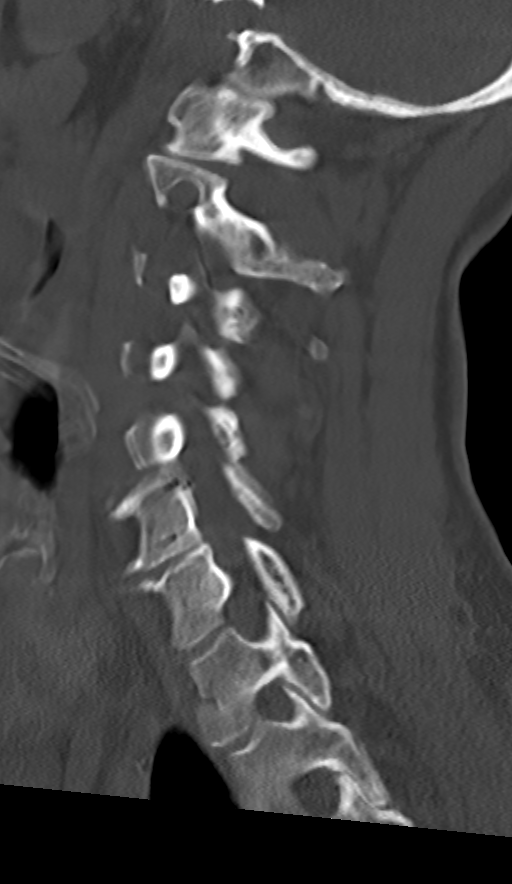
[im 31/61  soft-tissue]
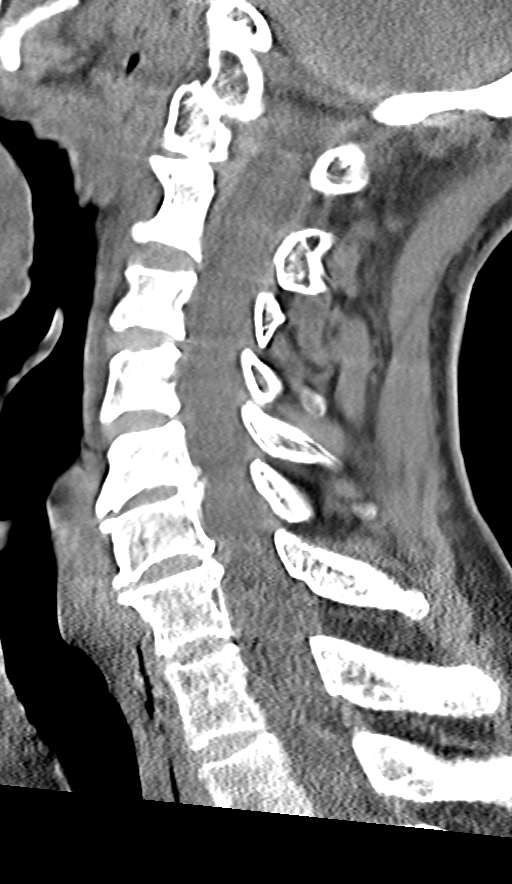
[im 31/61  bone]
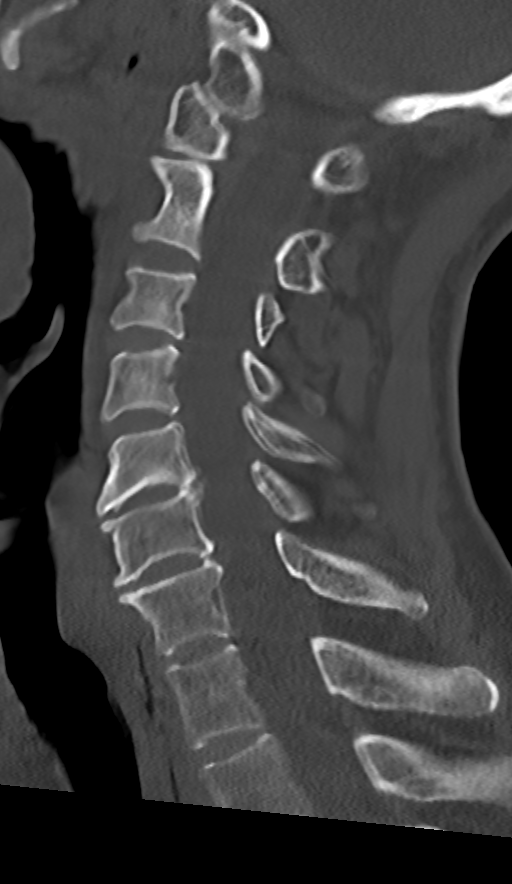
[im 36/61  bone]
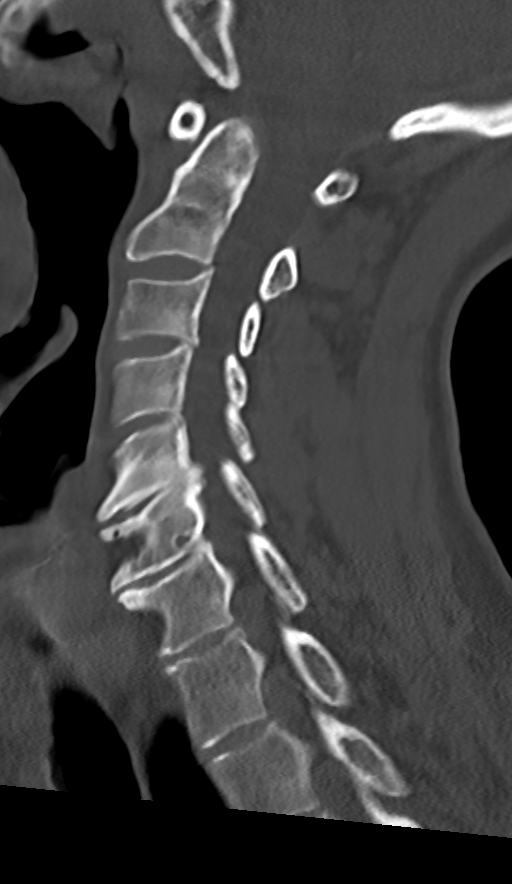
[im 41/61  bone]
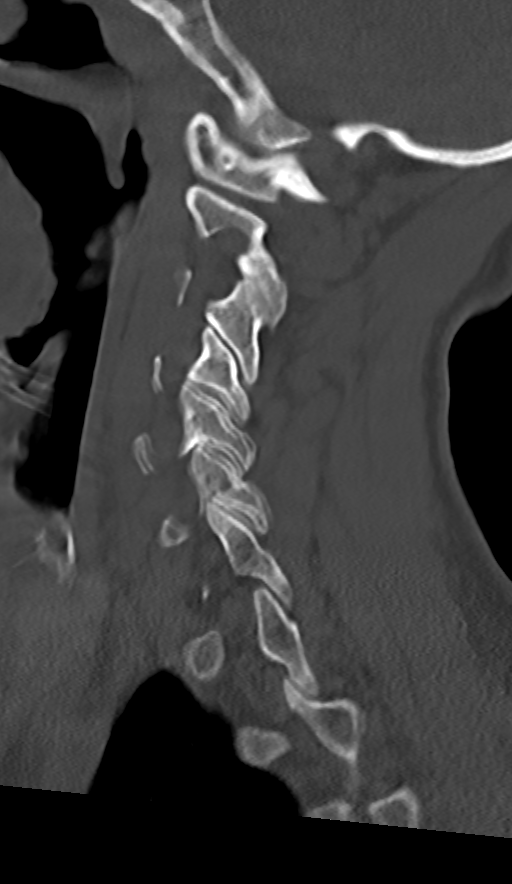

[Series 7: cor bone · coronal · 0.25mm/px · 3 of 61 slices shown]
[im 13/61  bone]
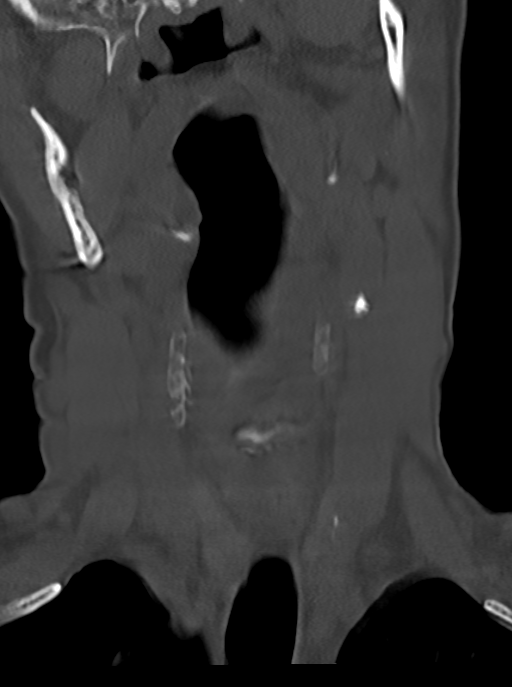
[im 25/61  bone]
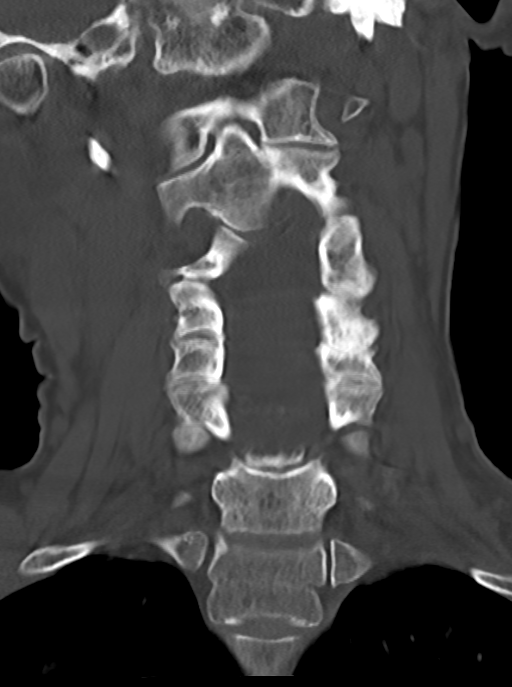
[im 37/61  bone]
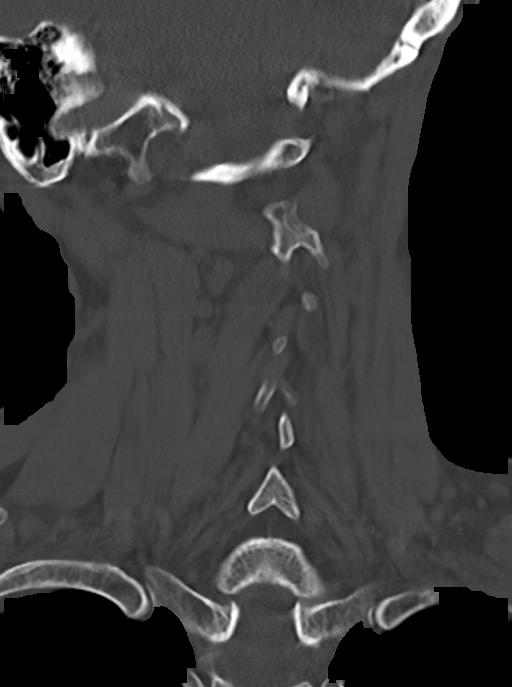

[Series 8: orthogonal axials · axial · 0.21mm/px · z∈[-265,-149]mm · 4 of 87 slices shown, 5 images]
[im 15/87  soft-tissue]
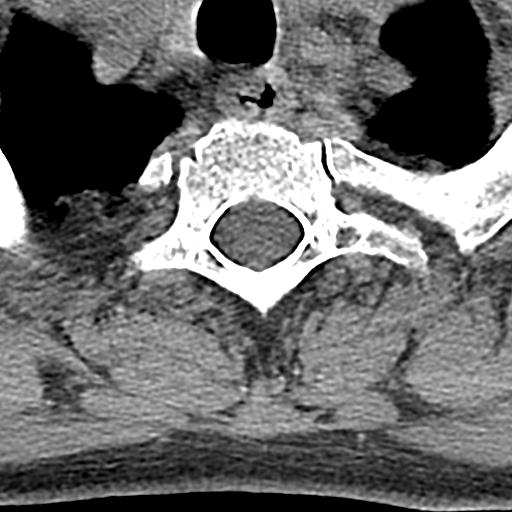
[im 15/87  bone]
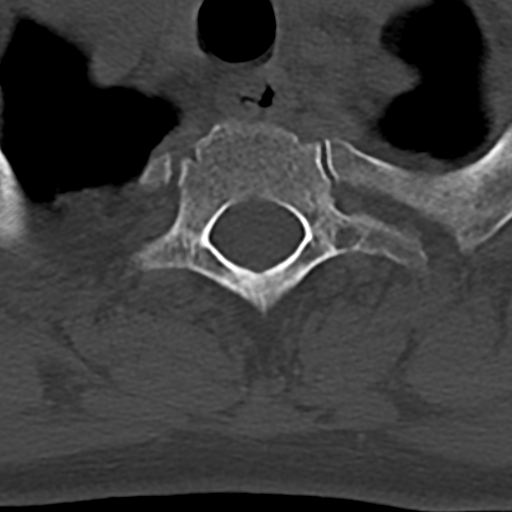
[im 29/87  bone]
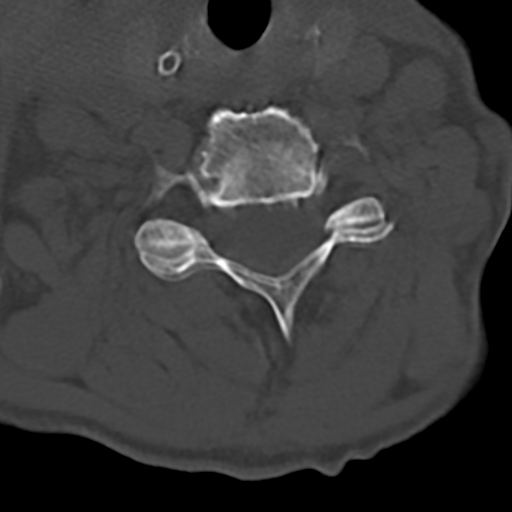
[im 58/87  bone]
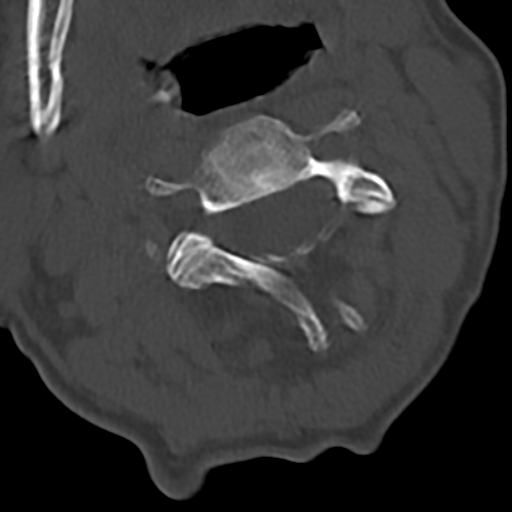
[im 72/87  bone]
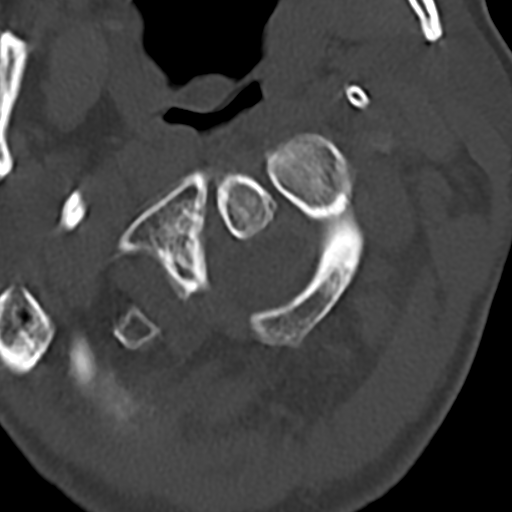

[12 of 33 positions shown; findings below may reference images not displayed]

FINDINGS: Alignment: No traumatic malalignment.

Skull base and vertebrae: Mild motion degradation, but no evidence
of fracture or acute traumatic finding.

Soft tissues and spinal canal: Negative.  No traumatic finding.

Disc levels: Degenerative spondylosis at C5-6 and C6-7 with endplate
osteophytes. Facet arthritis on the left at C4-5 and C5-6. Mild
foraminal narrowing on the right at C5-6.

Upper chest: Negative

Other: Old right PCA territory infarction.
IMPRESSION: No acute or traumatic finding. Chronic spondylosis and facet
arthropathy as outlined above.

## 2019-09-15 IMAGING — DX DG RIBS W/ CHEST 3+V*R*
5 series · 5 of 5 positions shown · non-contrast
Comparison: 02/17/2017

CLINICAL DATA: Seizure with fall and right-sided chest pain.

EXAM:
RIGHT RIBS AND CHEST - 3+ VIEW

[x chest ap (1 of 2)]
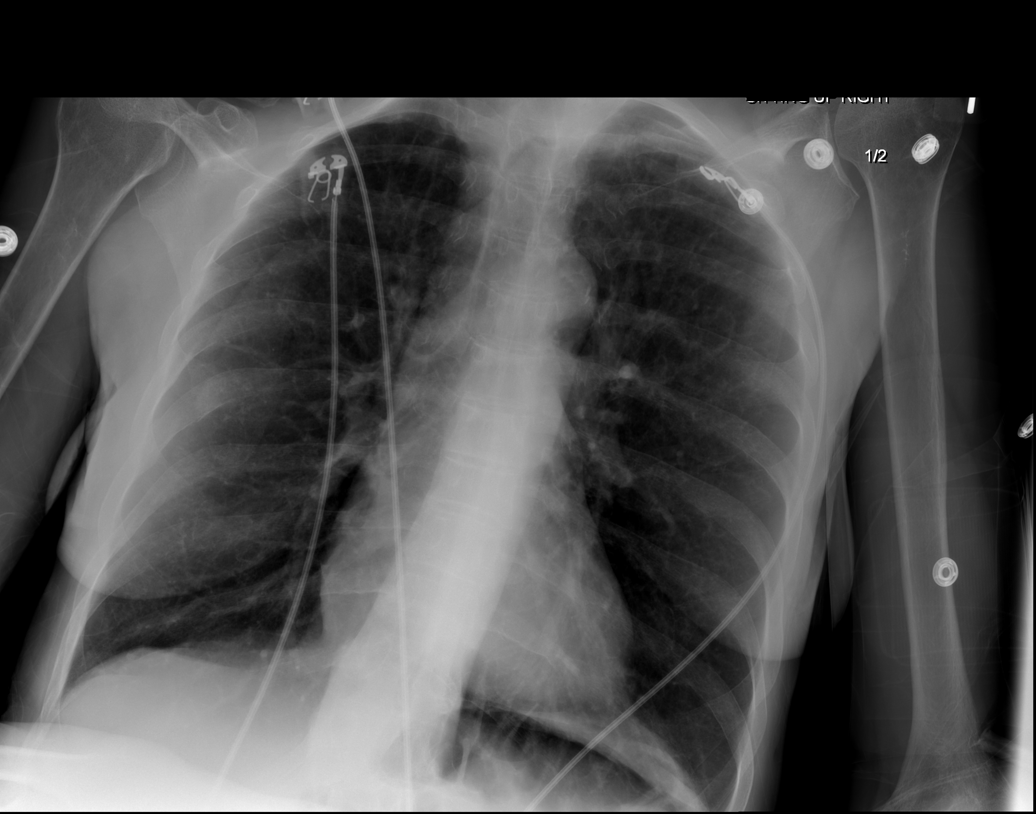

[x chest ap (2 of 2)]
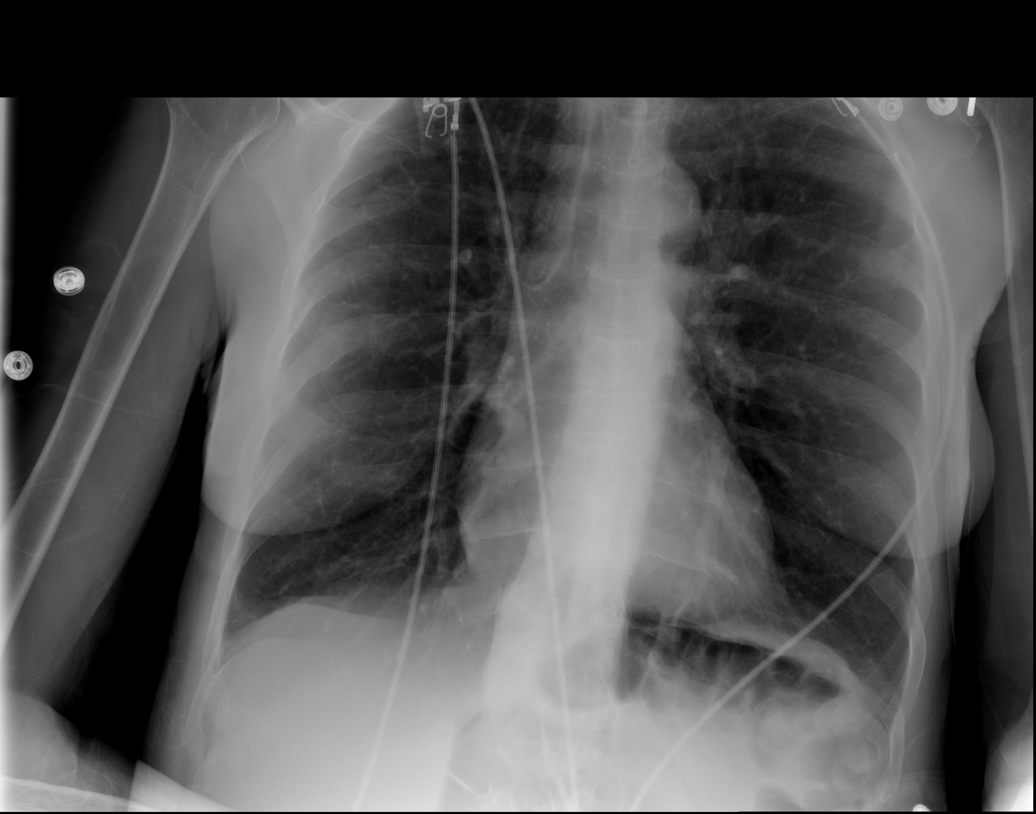

[t ribs ap upper right]
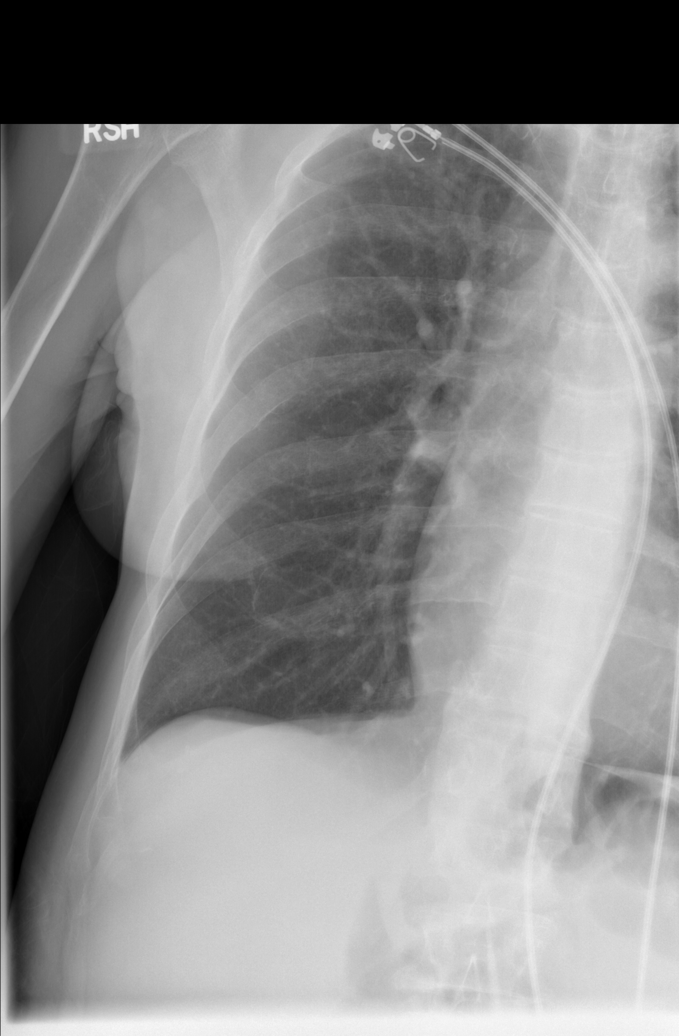

[t ribs rpo right (1 of 2)]
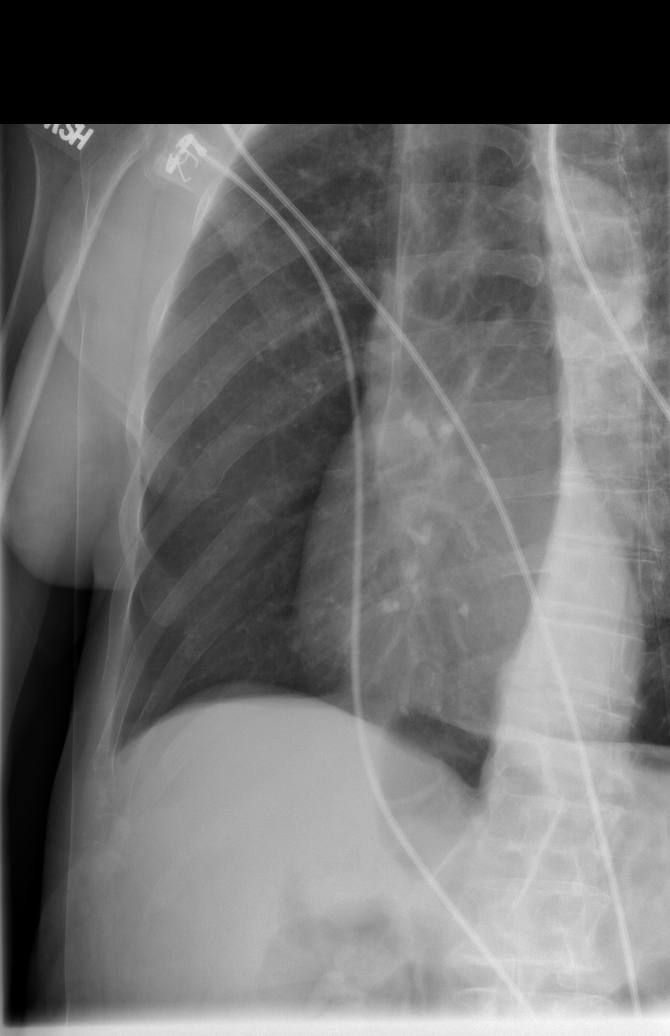

[t ribs rpo right (2 of 2)]
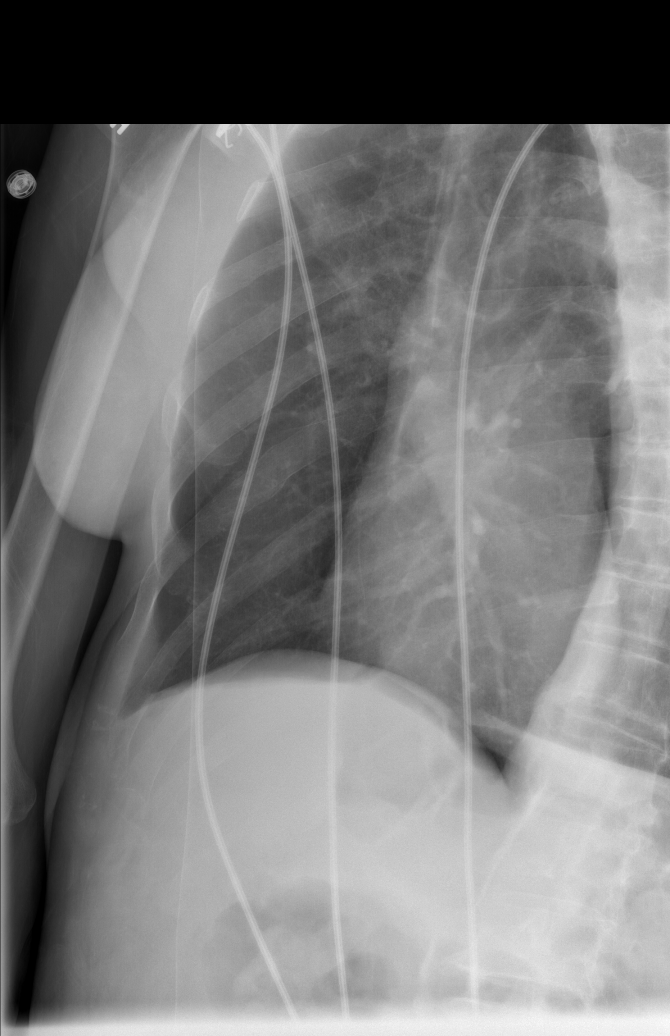

[5 of 5 positions shown; findings below may reference images not displayed]

FINDINGS: Heart size is normal. Chronic aortic atherosclerosis. The lungs are
clear. No pneumothorax or hemothorax.

Old healed right clavicle fracture and right lateral rib fractures.
No acute rib fracture is seen.
IMPRESSION: No active cardiopulmonary disease. No evidence of acute bony injury.
Old healed right clavicle fracture. Old right rib fracture, at least
at rib 7.

## 2019-09-16 ENCOUNTER — Ambulatory Visit: Payer: Medicare HMO | Admitting: Neurology

## 2019-09-23 DIAGNOSIS — Z5181 Encounter for therapeutic drug level monitoring: Secondary | ICD-10-CM | POA: Diagnosis not present

## 2019-09-28 ENCOUNTER — Ambulatory Visit: Payer: Medicare HMO | Admitting: Neurology

## 2019-09-30 ENCOUNTER — Ambulatory Visit: Payer: Medicare Other | Admitting: Neurology

## 2019-09-30 ENCOUNTER — Ambulatory Visit (INDEPENDENT_AMBULATORY_CARE_PROVIDER_SITE_OTHER): Payer: Medicare Other | Admitting: Neurology

## 2019-09-30 ENCOUNTER — Other Ambulatory Visit: Payer: Self-pay

## 2019-09-30 DIAGNOSIS — G243 Spasmodic torticollis: Secondary | ICD-10-CM

## 2019-09-30 MED ORDER — ONABOTULINUMTOXINA 100 UNITS IJ SOLR
250.0000 [IU] | Freq: Once | INTRAMUSCULAR | Status: AC
  Administered 2019-09-30: 250 [IU] via INTRAMUSCULAR

## 2019-09-30 NOTE — Procedures (Signed)
[facility]   Procedure Note Botox  Attending: Dr. Wells Guiles Alvie Speltz  Preoperative Diagnosis(es): Cervical Dystonia  Result History  Onset of effect: unknown but states that "head no longer bouncing"  Last injections in April.  Missed f/u injections as mother died. Duration of Benefit: n/a  Adverse Effects: n/a   Consent obtained from: The patient Benefits discussed included, but were not limited to decreased muscle tightness, increased joint range of motion, and decreased pain.  Risk discussed included, but were not limited pain and discomfort, bleeding, bruising, excessive weakness, venous thrombosis, muscle atrophy and dysphagia.  A copy of the patient medication guide was given to the patient which explains the blackbox warning.  Patients identity and treatment sites confirmed Yes.  .  Details of Procedure: Skin was cleaned with alcohol.  A 30 gauge, 5mm  needle was introduced to the target muscle, except for posterior splenius where 27 gauge, 1.5 inch needle used.   Prior to injection, the needle plunger was aspirated to make sure the needle was not within a blood vessel.  There was no blood retrieved on aspiration.    Following is a summary of the muscles injected  And the amount of Botulinum toxin used:   Dilution 0.9% preservative free saline mixed with 100 u Botox type A to make 10 U per 0.1cc  Injections  Location Left  Right Units Number of sites        Sternocleidomastoid  60 60 1  Splenius Capitus, posterior approach 100  100 1  Splenius Capitus, lateral approach 40  40 1  Levator Scapulae  20 20 1   Trapezius            TOTAL UNITS:   220    Agent: Botulinum Type A ( Onobotulinum Toxin type A ).  2 vials of Botox were used, each containing 100 units and freshly diluted with 1 mL of sterile, non-preserved saline   Total injected (Units): 220  Total wasted (Units): 30  Total Used: 250   Pt tolerated procedure well without complications.   Reinjection is  anticipated in 3 months.

## 2019-10-25 ENCOUNTER — Encounter: Payer: Self-pay | Admitting: Neurology

## 2019-10-25 ENCOUNTER — Ambulatory Visit (INDEPENDENT_AMBULATORY_CARE_PROVIDER_SITE_OTHER): Payer: Medicare HMO | Admitting: Neurology

## 2019-10-25 ENCOUNTER — Other Ambulatory Visit: Payer: Self-pay

## 2019-10-25 VITALS — BP 118/73 | HR 66 | Ht 67.0 in | Wt 139.4 lb

## 2019-10-25 DIAGNOSIS — G40219 Localization-related (focal) (partial) symptomatic epilepsy and epileptic syndromes with complex partial seizures, intractable, without status epilepticus: Secondary | ICD-10-CM

## 2019-10-25 DIAGNOSIS — R531 Weakness: Secondary | ICD-10-CM

## 2019-10-25 DIAGNOSIS — G629 Polyneuropathy, unspecified: Secondary | ICD-10-CM | POA: Diagnosis not present

## 2019-10-25 NOTE — Progress Notes (Signed)
NEUROLOGY FOLLOW UP OFFICE NOTE  [name] [id number] 16-Mar-1956  HISTORY OF PRESENT ILLNESS: I had the pleasure of seeing [name] in follow-up in the neurology clinic on 10/25/2019.  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 Since her last visit, she was admitted to [facility] in August 26, 2019 for ataxia. She was found to have a UTI and supratherapeutic Dilantin level of 22.2 (baseline 12.4 on 08/11/19). I personally reviewed MRI brain without contrast done Jan 2021 which did not show any acute changes. There was a large area of right parieto-occipital encephalomalacia communicating with a dilated right lateral ventricle. Minimal FLAIR change in the white matter adjacent to the left lateral ventricle unchanged, compatible with chronic microvascular disease. There is chronic asymmetry right hippocampal volume loss. She was discharged home on Dilantin 100mg  daily, but called our office last month to report back to back seizures with lowered dose. She was instructed to take Dilantin 100mg  in AM, 130mg  in PM. She is also taking Vimpat 200mg  BID and Xcopri 100mg  daily. She has prn lorazepam for breakthrough seizure, but has been taking it twice a week at night because she is "jittery," which helps. She feels her balance is better but she still has moments with her left leg. Her legs hurt and she has back pain. She has noticed frequent urination. She drinks a bottle of water daily, in addition to 3 cups of coffee, last cup at noon. She feels there has been some reduction in seizures with addition of Xcopri, last witnessed seizure was last month. She thinks she had a nocturnal seizure last week when she woke up with urinary incontinence. She is happy to report she has stopped smoking. She has gabapentin 300mg  qhs for neuropathy.  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 She had been seeing epileptologist [name], records were reviewed and will be summarized as follows. In the past, seizures would start with headache and burning sensation of the left arm, which may be followed by left arm jerking. Over time, she has not had any prior warning symptoms. She has had seizures where she is noted to have kicking and rocking movements of the pelvis with unresponsiveness. She was in the EMU in 10/2012 where 11 seizures were captured from the right posterior hemisphere/right inferior temporal region. Interictal EEG showed abundant right posterior temporal epileptiform discharges, frequent right anterior temporal epileptiform discharges, as well as occasional independent left anterior temporal epileptiform discharges. There was focal slowing over the right posterior hemisphere. MRI brain in 2014 showed right parietal/occipital encephalomalacia, right hippocampus smaller than left. PET scan in 2015 showed multiple areas of hypometabolism on the right, mostly corresponding to or adjacent to the structural abnormality. Ictal SPECT showed right hemisphere hypoperfusion. SISCOM showed right frontal region significant on the subtraction. MEG in 07/2013 showed spikes localizing to the right occipital, parietal, mesial temporal regions with a weak signal left mesial parietal. Concomitant EEG showed right posterior spikes. Functional mapping suggestive of left language lateralization. She has tried multiple AEDs in the past with various side effects or ineffective, and has been taking Dilantin 160mg  in AM, 200mg  in PM and Vimpat 200mg  BID with seizures around once a month. She was admitted to [facility] on 03/19/18 for  left leg weakness and drowsiness. She was fishing with her boyfriend when she suddenly could not move her left leg  and almost fell. She states that her boyfriend told her that she was shaking and had a seizure while they were fishing. She was brought to [facility] where Dilantin level was 33.8. She was adamant that she has been taking her medications as instructed and did not take more than prescribed, no new medications taken. She had an MRI brain without contrast which I personally reviewed, no acute changes seen. Large volume right parieto-occipital encephalomalacia was unchanged. Right hippocampus is smaller. She continued to report weakness and had an MRI cervical and lumbar spine which did not show any myelopathy, there was prominent left-sided facet hypertrophy at C4-5 with resultant moderate left C5 foraminal stenosis, degenerative disc osteophyte at C5-6 with severe right C6 foraminal stenosis, and mild spinal stenosis at C5-6 and C6-7, chronic borderline to mild multifactorial spinal stenosis at L2-3 through L4-5 with no convincing lateral recess or foraminal stenosis. She continued to report burning pain in her legs and was started on low dose gabapentin 300mg  qhs with no side effects. Dilantin was held in the hospital, she was discharged on a lower dose, but states that she has been taking her pre-hospital dose of 160mg  in AM, 200mg  in PM but has had 4 seizures since hospital discharge 2 weeks ago, which is unusual. She had 2 seizures in one day in the car with her mother-in-law, her mother-in-law had to pull over. The next day she had 2 at home, her daughter found her in the bathroom. She has not been sleeping well the past few days. She has a headache after the seizures. Aside from the convulsions, she has episodes where a feeling comes over her left side, from the arm down her leg, then she is weaker after. She denies any jerking. She is quite anxious in the office today and perseverates on her symptoms. She states she "stays nervous." She reports taking prn Ativan in the past, but discontinued at [facility]. She repeatedly  states her feet are numb and they "feel like they are grabbed." She feels gabapentin has helped some with this.   Prior AEDs: phenobarbital (ineffective, mood issues), Depakote (multiple trials, ineffective), Keppra (ineffective), Zonisamide (side effects on brief trial of higher dose than prescribed - 500mg  instead of 200mg ), Onfi (mouth sores), Tegretol (ineffective), Lamictal (nausea, dizziness, malaise), Felbatol (nausea, dizziness, malaise), Topamax (ineffective), Tigabine (tried per notes, details unclear), Neurontin (ineffective, nausea), Lyrica (leg pain reported on 25mg  daily, tingling), Briviact (drowsiness), Fycompa  Epilepsy Risk Factors:  Right parieto-occipital AVM s/p rupture and hemorrhage in 1988, s/p craniectomy. Otherwise she had a normal birth and early development.  There is no history of febrile convulsions, CNS infections such as meningitis/encephalitis, or family history of seizures.  Diagnostic Data:  MRI brain without contrast 05/2018 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  EEG done 06/2018 showed right posterior temporal slowing, occasional epileptiform discharges over the right posterior temporal region, breach artifact in this region.   MRI cervical, thoracic, and lumbar spine done 07/2018 showed no convincing cervical or thoracic spinal cord abnormality. Mild degenerative cervical spinal stenosis at C5-6 and C6-7 with moderate or severe bilateral foraminal stenosis; no thoracic spinal or foraminal stenosis; stable borderline to mild lumbar spinal stenosis  MRI lumbar spine in 2019 showed borderline to mild multifactorial spinal stenosis at L2-L3 and L3-L4 related to circumferential disc bulge and  posterior element Hypertrophy.  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    PAST MEDICAL  HISTORY: Past Medical History:  Diagnosis Date  . Asthma   . Cerebral hemorrhage ([place]) 1989  . Chronic back pain   . COPD (chronic obstructive pulmonary disease) ([place])   . DDD (degenerative disc disease) 06/12/2013  . DVT (deep venous thrombosis) ([place])    "she's had several since 1990"  . GERD (gastroesophageal reflux disease)   . Headache    "usually around time when she's had a seizure"  . History of blood transfusion    "when she was a baby"  . History of stomach ulcers   . Kidney stones   . Neuropathy   . Seizures ([place])    "because of her brain surgery"  . Stroke [facility]) [phone]   family denies residual on 11/09/2014  . Tunnel vision    "since brain OR"    MEDICATIONS: Current Outpatient Medications on File Prior to Visit  Medication Sig Dispense Refill  . acetaminophen (TYLENOL) 500 MG tablet Take 1,000 mg by mouth every 6 (six) hours as needed for mild pain.    Marland Kitchen Cenobamate (XCOPRI) 100 MG TABS Take 1 tablet by mouth daily. 30 tablet 5  . cholecalciferol (VITAMIN D3) 25 MCG (1000 UT) tablet Take 1,000 Units by mouth 2 (two) times daily.    . Cyanocobalamin (VITAMIN B-12) 1000 MCG TABS Take 1,000 mcg by mouth daily. 30 tablet 0  . escitalopram (LEXAPRO) 10 MG tablet Take 1 tablet (10 mg total) by mouth daily. 90 tablet 3  . gabapentin (NEURONTIN) 300 MG capsule Take 1 capsule every night 90 capsule 3  . lacosamide (VIMPAT) 200 MG TABS tablet Take 1 tablet (200 mg total) by mouth 2 (two) times daily. 180 tablet 3  . LORazepam (ATIVAN) 1 MG tablet Take 1 tablet as needed for seizure. Do not take more than 2 in 24 hours. 10 tablet 5  . phenytoin (DILANTIN) 100 MG ER capsule 100mg  in AM, 130mg  in PM 30 capsule 0   Current Facility-Administered Medications on File Prior to Visit  Medication Dose Route Frequency Provider Last Rate Last Admin  . botulinum toxin Type A (BOTOX) injection 200 Units  200 Units Intramuscular Once Tat, Rebecca S, DO        ALLERGIES: Allergies    Allergen Reactions  . Zonisamide Other (See Comments)    Numbness and tingling over whole body  . Chocolate Flavor Other (See Comments)    Trigger for Seizure  . Codeine Nausea And Vomiting  . Keppra [Levetiracetam] Other (See Comments)    Causes seizures  . Pregabalin Nausea And Vomiting  . Olive Oil Rash    FAMILY HISTORY: Family History  Problem Relation Age of Onset  . Heart attack Mother        4 MIs, started in her 49s, also vaginal cancer and colon cancer  . Cancer Mother   . Heart attack Father        Died suddenly age 48 - autopsy revealed heart attack her patient  . Cancer Brother        Sinus cancer    SOCIAL HISTORY: Social History   Socioeconomic History  . Marital status: Divorced    Spouse name: Not on file  . Number of children: 3  . Years of education: Not on file  . Highest education level: Not on file  Occupational History  .  Not on file  Tobacco Use  . Smoking status: Former Smoker    Packs/day: 0.50    Years: 20.00    Pack years: 10.00    Types: Cigarettes    Quit date: 10/26/2017    Years since quitting: 1.9  . Smokeless tobacco: Never Used  . Tobacco comment: Previously smoked 2 ppd, down to 4 cigs/day  Substance and Sexual Activity  . Alcohol use: No  . Drug use: No  . Sexual activity: Never    Birth control/protection: Post-menopausal  Other Topics Concern  . Not on file  Social History Narrative   Pt lives in 2 story home with her daughter, daughter's family and pt's mother-in-law   Has 2 living children / 1 deceased   11-13-22 grade education   Worked for [employer] until 1998 when she became disabled.    Right handed   Social Determinants of Health   Financial Resource Strain:   . Difficulty of Paying Living Expenses:   Food Insecurity:   . Worried About Charity fundraiser in the Last Year:   . Arboriculturist in the Last Year:   Transportation Needs:   . Film/video editor (Medical):   Marland Kitchen Lack of Transportation  (Non-Medical):   Physical Activity:   . Days of Exercise per Week:   . Minutes of Exercise per Session:   Stress:   . Feeling of Stress :   Social Connections:   . Frequency of Communication with Friends and Family:   . Frequency of Social Gatherings with Friends and Family:   . Attends Religious Services:   . Active Member of Clubs or Organizations:   . Attends Archivist Meetings:   Marland Kitchen Marital Status:   Intimate Partner Violence:   . Fear of Current or Ex-Partner:   . Emotionally Abused:   Marland Kitchen Physically Abused:   . Sexually Abused:     REVIEW OF SYSTEMS: Constitutional: No fevers, chills, or sweats, no generalized fatigue, change in appetite Eyes: No visual changes, double vision, eye pain Ear, nose and throat: No hearing loss, ear pain, nasal congestion, sore throat Cardiovascular: No chest pain, palpitations Respiratory:  No shortness of breath at rest or with exertion, wheezes GastrointestinaI: No nausea, vomiting, diarrhea, abdominal pain, fecal incontinence Genitourinary:  No dysuria, urinary retention or frequency Musculoskeletal:  No neck pain, +back pain Integumentary: No rash, pruritus, skin lesions Neurological: as above Psychiatric: No depression, insomnia, anxiety Endocrine: No palpitations, fatigue, diaphoresis, mood swings, change in appetite, change in weight, increased thirst Hematologic/Lymphatic:  No anemia, purpura, petechiae. Allergic/Immunologic: no itchy/runny eyes, nasal congestion, recent allergic reactions, rashes  PHYSICAL EXAM: Vitals:   10/25/19 1232  BP: 118/73  Pulse: 66  SpO2: 97%   General: No acute distress. She has torticollis with head bent to the right, side to side head tremor Head:  Normocephalic/atraumatic Skin/Extremities: No rash, no edema Neurological Exam: alert and oriented to person, place, and time. No aphasia or dysarthria. Fund of knowledge is appropriate.  Recent and remote memory are intact.  Attention and  concentration are normal. Cranial nerves: Pupils equal, round, reactive to light. Extraocular movements intact with no nystagmus. Visual fields full. No facial asymmetry.  Motor: Bulk and tone normal, muscle strength 5/5 throughout with no pronator drift. Finger to nose testing intact.  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 She has tried multiple AEDs in the past with various side effects. She has had several admissions for Dilantin toxicity, most recently 08/26/19 in the setting of addition of Xcopri. She is now on lower dose Dilantin 100mg  in AM, 130mg  in PM, in addition to [place] 100mg  daily and Vimpat 200mg  BID. She overall feels better, reports unsteadiness today due to left leg/back pain. Repeat Dilantin level will be ordered. She will be referred for home PT for left leg weakness/back pain, continue gabapentin 300mg  qhs. She is interested in surgical options for intractable epilepsy and will discuss with her family, advised to speak with [name] if she would like to proceed with evaluation. She does not drive. Continue follow-up with Dr. Carles Collet for cervical dystonia and Botox. Follow-up in 3-4 months, she know to call for any changes.   Thank you for allowing me to participate in her care.  Please do not hesitate to call for any questions or concerns.   Ellouise Newer, M.D.   CC: Dr. Maudie Mercury

## 2019-10-25 NOTE — Patient Instructions (Addendum)
1. Refer to home PT for left leg weakness and back pain  2. Have bloodwork for Dilantin level done this week, first thing in the morning before you take your Dilantin   3. Continue all your medications: - Dilantin 100mg  in morning, 130mg  at night - Vimpat 200mg  twice a day - Xcopri 100mg  once a day - gabapentin 300mg  every night - as needed Ativan   4. If you would like to consider surgery discussion again with [name], discuss with family  5. Follow-up in 3-4 months, call for any changes  Seizure Precautions: 1. If medication has been prescribed for you to prevent seizures, take it exactly as directed.  Do not stop taking the medicine without talking to your doctor first, even if you have not had a seizure in a long time.   2. Avoid activities in which a seizure would cause danger to yourself or to others.  Don't operate dangerous machinery, swim alone, or climb in high or dangerous places, such as on ladders, roofs, or girders.  Do not drive unless your doctor says you may.  3. If you have any warning that you may have a seizure, lay down in a safe place where you can't hurt yourself.    4.  No driving for 6 months from last seizure, as per [facility].   Please refer to the following link on the [facility] website for more information: http://[email or web address]/[email or web address]/[email or web address]   5.  Maintain good sleep hygiene. Avoid alcohol.  6.  Contact your doctor if you have any problems that may be related to the medicine you are taking.  7.  Call 911 and bring the patient back to the ED if:        A.  The seizure lasts longer than 5 minutes.       B.  The patient doesn't awaken shortly after the seizure  C.  The patient has new problems such as difficulty seeing, speaking or moving  D.  The patient was injured during the seizure  E.  The patient has a temperature over 102 F (39C)  F.  The patient vomited and now is  having trouble breathing

## 2019-10-26 ENCOUNTER — Other Ambulatory Visit: Payer: Medicare HMO

## 2019-10-26 DIAGNOSIS — R531 Weakness: Secondary | ICD-10-CM

## 2019-10-26 DIAGNOSIS — G629 Polyneuropathy, unspecified: Secondary | ICD-10-CM | POA: Diagnosis not present

## 2019-10-26 DIAGNOSIS — G40219 Localization-related (focal) (partial) symptomatic epilepsy and epileptic syndromes with complex partial seizures, intractable, without status epilepticus: Secondary | ICD-10-CM

## 2019-10-27 ENCOUNTER — Telehealth: Payer: Self-pay

## 2019-10-27 LAB — PHENYTOIN LEVEL, TOTAL: Phenytoin, Total: 15 mg/L (ref 10.0–20.0)

## 2019-10-27 NOTE — Telephone Encounter (Signed)
-----   Message from [name], MD sent at 10/27/2019  9:37 AM EDT ----- Pls let Jestine know her Dilantin level is within range, no need to change her medications at this time. Proceed with PT for balance. Thanks

## 2019-10-27 NOTE — Telephone Encounter (Signed)
Spoke with [name]  Dilantin level is within range, no need to change her medications at this time. Proceed with PT for balance.

## 2019-10-27 NOTE — Telephone Encounter (Signed)
-----   Message from [name], MD sent at 10/27/2019  9:37 AM EDT ----- Pls let [name] know her Dilantin level is within range, no need to change her medications at this time. Proceed with PT for balance. Thanks

## 2019-10-27 NOTE — Telephone Encounter (Signed)
Pt called no answer voice mail left for pt to call back to inform that Dilantin level is within range, no need to change her medications at this time. Proceed with PT for balance.

## 2019-12-09 ENCOUNTER — Other Ambulatory Visit: Payer: Self-pay

## 2019-12-09 MED ORDER — XCOPRI 100 MG PO TABS: 1.0000 | ORAL_TABLET | Freq: Every day | ORAL | 5 refills | Status: DC

## 2019-12-12 ENCOUNTER — Other Ambulatory Visit: Payer: Self-pay

## 2019-12-12 MED ORDER — B-12 1000 MCG PO TABS: 1000.0000 ug | ORAL_TABLET | Freq: Every day | ORAL | 0 refills | Status: DC

## 2019-12-13 ENCOUNTER — Other Ambulatory Visit: Payer: Self-pay

## 2019-12-13 MED ORDER — XCOPRI 100 MG PO TABS: 1.0000 | ORAL_TABLET | Freq: Every day | ORAL | 5 refills | Status: DC

## 2019-12-16 ENCOUNTER — Ambulatory Visit: Payer: Medicare Other | Admitting: Neurology

## 2019-12-19 ENCOUNTER — Other Ambulatory Visit: Payer: Self-pay

## 2019-12-19 MED ORDER — XCOPRI 100 MG PO TABS: 1.0000 | ORAL_TABLET | Freq: Every day | ORAL | 5 refills | Status: DC

## 2019-12-26 ENCOUNTER — Emergency Department (HOSPITAL_COMMUNITY)
Admission: EM | Admit: 2019-12-26 | Discharge: 2019-12-26 | Disposition: A | Discharge status: Home / Self Care | Payer: Medicare HMO | Attending: Emergency Medicine | Admitting: Emergency Medicine

## 2019-12-26 ENCOUNTER — Other Ambulatory Visit: Payer: Self-pay

## 2019-12-26 ENCOUNTER — Emergency Department (HOSPITAL_COMMUNITY): Payer: Medicare HMO

## 2019-12-26 DIAGNOSIS — G9009 Other idiopathic peripheral autonomic neuropathy: Secondary | ICD-10-CM | POA: Diagnosis not present

## 2019-12-26 DIAGNOSIS — N390 Urinary tract infection, site not specified: Secondary | ICD-10-CM | POA: Insufficient documentation

## 2019-12-26 DIAGNOSIS — M545 Low back pain, unspecified: Secondary | ICD-10-CM

## 2019-12-26 DIAGNOSIS — I10 Essential (primary) hypertension: Secondary | ICD-10-CM | POA: Diagnosis not present

## 2019-12-26 DIAGNOSIS — Z8673 Personal history of transient ischemic attack (TIA), and cerebral infarction without residual deficits: Secondary | ICD-10-CM | POA: Diagnosis not present

## 2019-12-26 DIAGNOSIS — M542 Cervicalgia: Secondary | ICD-10-CM

## 2019-12-26 DIAGNOSIS — Z79899 Other long term (current) drug therapy: Secondary | ICD-10-CM | POA: Diagnosis not present

## 2019-12-26 DIAGNOSIS — R29898 Other symptoms and signs involving the musculoskeletal system: Secondary | ICD-10-CM | POA: Diagnosis not present

## 2019-12-26 DIAGNOSIS — S0990XA Unspecified injury of head, initial encounter: Secondary | ICD-10-CM | POA: Diagnosis not present

## 2019-12-26 DIAGNOSIS — S199XXA Unspecified injury of neck, initial encounter: Secondary | ICD-10-CM | POA: Diagnosis not present

## 2019-12-26 DIAGNOSIS — R52 Pain, unspecified: Secondary | ICD-10-CM | POA: Diagnosis not present

## 2019-12-26 DIAGNOSIS — G40909 Epilepsy, unspecified, not intractable, without status epilepticus: Secondary | ICD-10-CM | POA: Diagnosis not present

## 2019-12-26 DIAGNOSIS — G609 Hereditary and idiopathic neuropathy, unspecified: Secondary | ICD-10-CM | POA: Insufficient documentation

## 2019-12-26 DIAGNOSIS — S3992XA Unspecified injury of lower back, initial encounter: Secondary | ICD-10-CM | POA: Diagnosis not present

## 2019-12-26 DIAGNOSIS — R569 Unspecified convulsions: Secondary | ICD-10-CM

## 2019-12-26 DIAGNOSIS — Z87891 Personal history of nicotine dependence: Secondary | ICD-10-CM | POA: Insufficient documentation

## 2019-12-26 DIAGNOSIS — J449 Chronic obstructive pulmonary disease, unspecified: Secondary | ICD-10-CM | POA: Diagnosis not present

## 2019-12-26 DIAGNOSIS — W19XXXA Unspecified fall, initial encounter: Secondary | ICD-10-CM

## 2019-12-26 DIAGNOSIS — R000 Tachycardia, unspecified: Secondary | ICD-10-CM | POA: Diagnosis not present

## 2019-12-26 DIAGNOSIS — R410 Disorientation, unspecified: Secondary | ICD-10-CM | POA: Diagnosis not present

## 2019-12-26 DIAGNOSIS — R0902 Hypoxemia: Secondary | ICD-10-CM | POA: Diagnosis not present

## 2019-12-26 LAB — CBC
HCT: 40.4 % (ref 36.0–46.0)
Hemoglobin: 12.9 g/dL (ref 12.0–15.0)
MCH: 34 pg (ref 26.0–34.0)
MCHC: 31.9 g/dL (ref 30.0–36.0)
MCV: 106.6 fL — ABNORMAL HIGH (ref 80.0–100.0)
Platelets: 123 10*3/uL — ABNORMAL LOW (ref 150–400)
RBC: 3.79 MIL/uL — ABNORMAL LOW (ref 3.87–5.11)
RDW: 12.7 % (ref 11.5–15.5)
WBC: 2.4 10*3/uL — ABNORMAL LOW (ref 4.0–10.5)
nRBC: 0 % (ref 0.0–0.2)

## 2019-12-26 LAB — BASIC METABOLIC PANEL
Anion gap: 8 (ref 5–15)
BUN: 9 mg/dL (ref 8–23)
CO2: 25 mmol/L (ref 22–32)
Calcium: 8.9 mg/dL (ref 8.9–10.3)
Chloride: 109 mmol/L (ref 98–111)
Creatinine, Ser: 0.88 mg/dL (ref 0.44–1.00)
GFR calc Af Amer: >60 mL/min (ref >60)
GFR calc non Af Amer: >60 mL/min (ref >60)
Glucose, Bld: 97 mg/dL (ref 70–99)
Potassium: 3.9 mmol/L (ref 3.5–5.1)
Sodium: 142 mmol/L (ref 135–145)

## 2019-12-26 LAB — URINALYSIS, ROUTINE W REFLEX MICROSCOPIC
Bilirubin Urine: NEGATIVE
Glucose, UA: NEGATIVE mg/dL
Hgb urine dipstick: NEGATIVE
Ketones, ur: NEGATIVE mg/dL
Leukocytes,Ua: NEGATIVE
Nitrite: POSITIVE — AB
Protein, ur: NEGATIVE mg/dL
Specific Gravity, Urine: 1.006 (ref 1.005–1.030)
pH: 8 (ref 5.0–8.0)

## 2019-12-26 LAB — PHENYTOIN LEVEL, TOTAL: Phenytoin Lvl: 16.4 ug/mL (ref 10.0–20.0)

## 2019-12-26 MED ORDER — SODIUM CHLORIDE 0.9% FLUSH: 3.0000 mL | Freq: Once | INTRAVENOUS | Status: DC

## 2019-12-26 MED ORDER — DIPHENHYDRAMINE HCL 50 MG/ML IJ SOLN: 12.5000 mg | Freq: Once | INTRAMUSCULAR | Status: DC

## 2019-12-26 MED ORDER — MORPHINE SULFATE (PF) 4 MG/ML IV SOLN
4.0000 mg | Freq: Once | INTRAVENOUS | Status: DC
  Filled 2019-12-26: qty 1

## 2019-12-26 MED ORDER — ONDANSETRON HCL 4 MG/2ML IJ SOLN
4.0000 mg | Freq: Once | INTRAMUSCULAR | Status: AC
  Administered 2019-12-26: 4 mg via INTRAVENOUS
  Filled 2019-12-26: qty 2

## 2019-12-26 MED ORDER — OXYCODONE-ACETAMINOPHEN 5-325 MG PO TABS
1.0000 | ORAL_TABLET | Freq: Once | ORAL | Status: AC
  Administered 2019-12-26: 1 via ORAL
  Filled 2019-12-26: qty 1

## 2019-12-26 MED ORDER — CEPHALEXIN 250 MG PO CAPS
250.0000 mg | ORAL_CAPSULE | Freq: Four times a day (QID) | ORAL | 0 refills | Status: DC
Start: 2019-12-26 — End: 2020-04-07

## 2019-12-26 MED ORDER — DIPHENHYDRAMINE HCL 25 MG PO CAPS
25.0000 mg | ORAL_CAPSULE | Freq: Once | ORAL | Status: AC
  Administered 2019-12-26: 25 mg via ORAL
  Filled 2019-12-26: qty 1

## 2019-12-26 NOTE — ED Notes (Signed)
Pt transported to CT ?

## 2019-12-26 NOTE — ED Notes (Signed)
This NT attempted to ambulate pt but pt is unsteady on feet. Dr.Butler has been notified.

## 2019-12-26 NOTE — ED Provider Notes (Signed)
[facility]PART[facility] Provider Note   CSN: [id number] Arrival date & time: 12/26/19  [id number]     History Chief Complaint  Patient presents with  . Loss of Consciousness    [name] is a 64 y.o. female.  She is brought in by her daughter who is giving most of the history.  She has a history of seizure disorder and cerebral hemorrhage.  She was on the commode when she said her feet went numb and she could not get up.  Then she thinks she had a seizure.  When she awoke she was confused and had some stuttering speech.  Has not walked since then.  Still slight her feet are numb although she has had that before.  Complaining of pain in her head neck and low back.  History of low back pain.  EMS placed in c-collar.  The history is provided by the patient and a relative.  Seizures Seizure type:  Grand mal Preceding symptoms: numbness   Initial focality:  Unable to specify Postictal symptoms: confusion and somnolence   Return to baseline: yes   Severity:  Unable to specify Timing:  Once Number of seizures this episode:  1 Progression:  Improving Context: not fever and medical compliance   Recent head injury:  No recent head injuries PTA treatment:  None History of seizures: yes   Severity:  Unable to specify Seizure control level:  Well controlled Current therapy:  Phenytoin Compliance with current therapy:  Good      Past Medical History:  Diagnosis Date  . Asthma   . Cerebral hemorrhage ([place]) 1989  . Chronic back pain   . COPD (chronic obstructive pulmonary disease) ([place])   . DDD (degenerative disc disease) 06/12/2013  . DVT (deep venous thrombosis) ([place])    "she's had several since 1990"  . GERD (gastroesophageal reflux disease)   . Headache    "usually around time when she's had a seizure"  . History of blood transfusion    "when she was a baby"  . History of stomach ulcers   . Kidney stones   . Neuropathy   . Seizures ([place])    "because of her brain surgery"  . Stroke [facility]) [phone]   family denies residual on 11/09/2014  . Tunnel vision    "since brain OR"    Patient Active Problem List   Diagnosis Date Noted  . Ataxia 08/26/2019  . Elevated Dilantin level 08/26/2019  . Closed fracture of proximal phalanx of great toe 01/04/2019  . Closed fracture of fifth metatarsal bone 01/04/2019  . Pain in left foot 01/04/2019  . Altered mental status   . Weakness of both lower extremities   . Generalized weakness 05/25/2018  . History of CVA (cerebrovascular accident)   . Pain   . Chronic back pain   . Neuropathy   . Orthostasis   . Tobacco abuse   . Left-sided weakness 03/19/2018  . Atypical chest pain 02/17/2017  . Localized swelling of lower extremity 02/17/2017  . GERD (gastroesophageal reflux disease) 02/17/2017  . Costochondritis 02/17/2017  . Seizures ([place]) 11/02/2015  . Gait disturbance 11/02/2015  . Right clavicle fracture 11/02/2015  . Urinary tract infection associated with catheterization of urinary tract, initial encounter ([place]) 11/02/2015  . Chest pain 03/31/2015  . Acute on chronic respiratory failure with hypoxia ([place]) 03/31/2015  . SOB (shortness of breath) 03/31/2015  . Malnutrition of moderate degree ([place]) 03/31/2015  . COPD with exacerbation ([place]) 03/30/2015  .  Hypoxia 01/18/2015  . Chronic obstructive pulmonary disease with acute exacerbation ([place])   . Fever 01/17/2015  . Cough 01/17/2015  . Sepsis secondary to UTI ([place]) 01/17/2015  . COPD (chronic obstructive pulmonary disease) ([place])   . Unintentional weight loss 01/11/2015  . Anxiety 10/22/2014  . Asthma 05/15/2014  . Seizure ([place]) 05/15/2014  . Protein calorie malnutrition ([place]) 05/15/2014  . Arteriovenous malformation of brain 11/17/2013  . 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 Stroke due to intracerebral hemorrhage ([place]) 11/17/2013  . DVT of leg (deep venous thrombosis) ([place])  06/19/2013  . DDD (degenerative disc disease) 06/12/2013  . Paresthesias 06/12/2013  . Tremor 06/09/2013  . Thrombocytopenia- chronic 06/29/2012  . Noncompliance with medication treatment due to underuse of medication 06/29/2012  . Stress-personal 06/29/2012  . Recurrent seizures ([place]) 06/29/2012  . Tobacco dependence 06/28/2012  . Skull fracture ([place]) 02/28/2012  . Dilantin toxicity 02/28/2012  . Seizure disorder ([place]) 02/28/2012  . History of Intracranial bleed 02/28/2012    Past Surgical History:  Procedure Laterality Date  . [place]   craniotomy with hematoma evacuation  . CESAREAN SECTION  1979; 1987; 1989  . HEMORRHOID SURGERY    . TUBAL LIGATION  1990's     OB History   No obstetric history on file.     Family History  Problem Relation Age of Onset  . Heart attack Mother        4 MIs, started in her 24s, also vaginal cancer and colon cancer  . Cancer Mother   . Heart attack Father        Died suddenly age 20 - autopsy revealed heart attack her patient  . Cancer Brother        Sinus cancer    Social History   Tobacco Use  . Smoking status: Former Smoker    Packs/day: 0.50    Years: 20.00    Pack years: 10.00    Types: Cigarettes    Quit date: 10/26/2017    Years since quitting: 2.1  . Smokeless tobacco: Never Used  . Tobacco comment: Previously smoked 2 ppd, down to 4 cigs/day  Substance Use Topics  . Alcohol use: No  . Drug use: No    Home Medications Prior to Admission medications   Medication Sig Start Date End Date Taking? Authorizing Provider  acetaminophen (TYLENOL) 500 MG tablet Take 1,000 mg by mouth every 6 (six) hours as needed for mild pain.    [provider]  Cenobamate (XCOPRI) 100 MG TABS Take 1 tablet by mouth daily. 12/19/19   [name], MD  cholecalciferol (VITAMIN D3) 25 MCG (1000 UT) tablet Take 1,000 Units by mouth 2 (two) times daily.    [provider]  Cyanocobalamin (B-12) 1000 MCG TABS  Take 1 tablet (1,000 mcg total) by mouth daily. 12/12/19   [name], MD  escitalopram (LEXAPRO) 10 MG tablet Take 1 tablet (10 mg total) by mouth daily. 03/10/19   [name], MD  gabapentin (NEURONTIN) 300 MG capsule Take 1 capsule every night 03/10/19   [name], MD  lacosamide (VIMPAT) 200 MG TABS tablet Take 1 tablet (200 mg total) by mouth 2 (two) times daily. 03/10/19   [name], MD  LORazepam (ATIVAN) 1 MG tablet Take 1 tablet as needed for seizure. Do not take more than 2 in 24 hours. 09/13/19   [name], MD  phenytoin (DILANTIN) 100 MG ER capsule  100mg  in AM, 130mg  in PM 09/14/19   [name], MD    Allergies    Zonisamide, Chocolate flavor, Codeine, Keppra [levetiracetam], Pregabalin, and Olive oil  Review of Systems   Review of Systems  Constitutional: Negative for fever.  HENT: Negative for sore throat.   Eyes: Negative for visual disturbance.  Respiratory: Negative for shortness of breath.   Cardiovascular: Negative for chest pain.  Gastrointestinal: Negative for abdominal pain.  Genitourinary: Negative for dysuria.  Musculoskeletal: Positive for back pain and neck pain.  Skin: Positive for wound. Negative for rash.  Neurological: Positive for seizures, numbness and headaches.    Physical Exam Updated Vital Signs BP (!) 114/52 (BP Location: Left Arm)   Pulse 76   Temp (!) 97.4 F (36.3 C) (Oral)   Resp 20   SpO2 97%   Physical Exam Vitals and nursing note reviewed.  Constitutional:      General: She is not in acute distress.    Appearance: She is well-developed.  HENT:     Head: Normocephalic and atraumatic.  Eyes:     Conjunctiva/sclera: Conjunctivae normal.  Cardiovascular:     Rate and Rhythm: Normal rate and regular rhythm.     Heart sounds: No murmur.  Pulmonary:     Effort: Pulmonary effort is normal. No respiratory distress.     Breath sounds: Normal breath sounds.  Abdominal:     Palpations: Abdomen is soft.      Tenderness: There is no abdominal tenderness.  Musculoskeletal:        General: No deformity. Normal range of motion.     Cervical back: Neck supple.     Comments: Few abrasions on her arms.  Skin:    General: Skin is warm and dry.  Neurological:     General: No focal deficit present.     Mental Status: She is alert.     Sensory: Sensory deficit (feet burning and hot feeling) present.     Motor: No weakness.     ED Results / Procedures / Treatments   Labs (all labs ordered are listed, but only abnormal results are displayed) Labs Reviewed  CBC - Abnormal; Notable for the following components:      Result Value   WBC 2.4 (*)    RBC 3.79 (*)    MCV 106.6 (*)    Platelets 123 (*)    All other components within normal limits  URINALYSIS, ROUTINE W REFLEX MICROSCOPIC - Abnormal; Notable for the following components:   Nitrite POSITIVE (*)    Bacteria, UA MANY (*)    All other components within normal limits  URINE CULTURE  BASIC METABOLIC PANEL  PHENYTOIN LEVEL, TOTAL  CBG MONITORING, ED    EKG EKG Interpretation  Date/Time:  Monday Dec 26 2019 13:50:18 EDT Ventricular Rate:  60 PR Interval:    QRS Duration: 98 QT Interval:  425 QTC Calculation: 425 R Axis:   89 Text Interpretation: Sinus rhythm Borderline right axis deviation No significant change since prior 1/21 Confirmed by Aletta Edouard [phone]) on 12/26/2019 1:52:45 PM   Radiology CT Head Wo Contrast  Result Date: 12/26/2019 CLINICAL DATA:  Trauma/fall, syncope.  Epilepsy. EXAM: CT HEAD WITHOUT CONTRAST CT CERVICAL SPINE WITHOUT CONTRAST TECHNIQUE: Multidetector CT imaging of the head and cervical spine was performed following the standard protocol without intravenous contrast. Multiplanar CT image reconstructions of the cervical spine were also generated. COMPARISON:  MR brain dated 08/26/2019. MR cervical spine dated 07/18/2018. FINDINGS: CT HEAD  FINDINGS Brain: No evidence of acute infarction, hemorrhage,  hydrocephalus, extra-axial collection or mass lesion/mass effect. Encephalomalacic changes in the right parieto-occipital region. Mild small vessel ischemic changes. Vascular: Mild intracranial atherosclerosis. Skull: Old right parieto-occipital craniotomy.  No acute fracture. Sinuses/Orbits: Visualized paranasal sinuses are clear. Partial opacification of the left mastoid air cells. Other: None. CT CERVICAL SPINE FINDINGS Alignment: Normal cervical lordosis. Skull base and vertebrae: No acute fracture. No primary bone lesion or focal pathologic process. Soft tissues and spinal canal: No prevertebral fluid or swelling. No visible canal hematoma. Disc levels: Mild degenerative changes at C5-6 and C6-7. Spinal canal is patent. Upper chest: Mild emphysematous changes with biapical pleural-parenchymal scarring. Other: Visualized thyroid is unremarkable. IMPRESSION: No evidence of acute intracranial abnormality. Old right parieto-occipital encephalomalacic changes with overlying craniotomy. No evidence of traumatic injury to the cervical spine. Mild degenerative changes of the mid/lower cervical spine. Electronically Signed   By: Julian Hy M.D.   On: 12/26/2019 12:46   CT Cervical Spine Wo Contrast  Result Date: 12/26/2019 CLINICAL DATA:  Trauma/fall, syncope.  Epilepsy. EXAM: CT HEAD WITHOUT CONTRAST CT CERVICAL SPINE WITHOUT CONTRAST TECHNIQUE: Multidetector CT imaging of the head and cervical spine was performed following the standard protocol without intravenous contrast. Multiplanar CT image reconstructions of the cervical spine were also generated. COMPARISON:  MR brain dated 08/26/2019. MR cervical spine dated 07/18/2018. FINDINGS: CT HEAD FINDINGS Brain: No evidence of acute infarction, hemorrhage, hydrocephalus, extra-axial collection or mass lesion/mass effect. Encephalomalacic changes in the right parieto-occipital region. Mild small vessel ischemic changes. Vascular: Mild intracranial  atherosclerosis. Skull: Old right parieto-occipital craniotomy.  No acute fracture. Sinuses/Orbits: Visualized paranasal sinuses are clear. Partial opacification of the left mastoid air cells. Other: None. CT CERVICAL SPINE FINDINGS Alignment: Normal cervical lordosis. Skull base and vertebrae: No acute fracture. No primary bone lesion or focal pathologic process. Soft tissues and spinal canal: No prevertebral fluid or swelling. No visible canal hematoma. Disc levels: Mild degenerative changes at C5-6 and C6-7. Spinal canal is patent. Upper chest: Mild emphysematous changes with biapical pleural-parenchymal scarring. Other: Visualized thyroid is unremarkable. IMPRESSION: No evidence of acute intracranial abnormality. Old right parieto-occipital encephalomalacic changes with overlying craniotomy. No evidence of traumatic injury to the cervical spine. Mild degenerative changes of the mid/lower cervical spine. Electronically Signed   By: Julian Hy M.D.   On: 12/26/2019 12:46   CT [name]  Result Date: 12/26/2019 CLINICAL DATA:  Fall, low back pain EXAM: CT LUMBAR SPINE WITHOUT CONTRAST TECHNIQUE: Multidetector CT imaging of the lumbar spine was performed without intravenous contrast administration. Multiplanar CT image reconstructions were also generated. COMPARISON:  None. FINDINGS: Segmentation: 5 lumbar type vertebrae. Alignment: Preserved lordosis. Anteroposterior alignment is maintained. Vertebrae: No acute fracture. Chronic loss of vertebral body height, primarily at T12 and L1. Scattered Schmorl's nodes. Paraspinal and other soft tissues: Aortic atherosclerosis. Disc levels: Multilevel mild disc bulges and facet hypertrophy with ligamentum flavum thickening. No high-grade stenosis. IMPRESSION: No acute fracture. Electronically Signed   By: Macy Mis M.D.   On: 12/26/2019 12:37    Procedures Procedures (including critical care time)  Medications Ordered in ED Medications    sodium chloride flush (NS) 0.9 % injection 3 mL (has no administration in time range)  morphine 4 MG/ML injection 4 mg (4 mg Intravenous Not Given 12/26/19 1302)  ondansetron (ZOFRAN) injection 4 mg (4 mg Intravenous Given 12/26/19 1303)  oxyCODONE-acetaminophen (PERCOCET/ROXICET) 5-325 MG per tablet 1 tablet (1 tablet Oral Given 12/26/19 1319)  diphenhydrAMINE (BENADRYL) capsule 25 mg (25 mg Oral Given 12/26/19 1318)    ED Course  I have reviewed the triage vital signs and the nursing notes.  Pertinent labs & imaging results that were available during my care of the patient were reviewed by me and considered in my medical decision making (see chart for details).  Clinical Course as of Dec 26 1943  Mon Dec 26, 2019  1322 Labs show the patient to be neutropenic and thrombocytopenic although these have been seen prior.  Chemistry is normal.  Dilantin normal range.   [MB]  [id number] Nurse informed me she has had some red burning sensation after given IV Zofran.  Ordered her some Benadryl.  When her some oral pain medicine   [MB]  1406 Urinalysis equivocal with 0-5 whites but many bacteria nitrite positive.  Will culture.  Last urine culture was Klebsiella which was sensitive to cephalosporins.   [MB]  [id number] Attempted to ambulate the patient but she is very drowsy.  She had received some pain medicine and Benadryl for possible local allergic reaction after the Zofran dose.  Will reassess   [MB]  1528 Patient having difficulty walking in the department.  Per the daughter this happens frequently and she falls.  This has been going on over her course of a few years.  She is seeing her neurologist [name] for this.  She has not had an MRI since 2019.  We talked about this but they said that they would rather go home.  I will message [name] neurology and see if they can get her in the office sooner and possibly get some more imaging.  Return instructions discussed   [MB]    Clinical Course User  Index [MB] Hayden Rasmussen, MD   MDM Rules/Calculators/A&P                     This patient complains of possible seizure, fall, neck and back pain; this involves an extensive number of treatment Options and is a complaint that carries with it a high risk of complications and Morbidity. The differential includes seizure, bleed, fracture, dislocation, metabolic derangement, infection  I ordered, reviewed and interpreted labs, which included CBC showing low white count and low platelets seen before.  Chemistries normal.  Urinalysis equivocal for infection with many bacteria nitrite positive but no significant white cells.  Urine culture sent. I ordered medication Zofran Benadryl Percocet I ordered imaging studies which included CT head CT C-spine CT LS spine and I independently    visualized and interpreted imaging which showed no gross fractures or bleed Additional history obtained from patient's daughter who is primary caregiver Previous records obtained and reviewed in epic including last neurology notes from [name] After the interventions stated above, I reevaluated the patient and found patient to be still symptomatic with some numbness in her feet.  She has had this before.  Daughter states she has had difficulty walking at times after these episodes.  Offered MRI.  Currently the decline I recommended they follow-up with her neurologist.  Return instructions discussed.  Final Clinical Impression(s) / ED Diagnoses Final diagnoses:  Seizure ([place])  Cervical pain  Lumbar pain  Fall, initial encounter  Bilateral leg weakness  Idiopathic peripheral neuropathy  Lower urinary tract infection, acute    Rx / DC Orders ED Discharge Orders         Ordered    cephALEXin (KEFLEX) 250 MG capsule  4 times daily  12/26/19 1532           Hayden Rasmussen, MD 12/26/19 [phone]

## 2019-12-26 NOTE — ED Triage Notes (Signed)
Pt sts she was in the bathroom, stood up, her legs gave out on her and she passed out. Hx epilepsy, compliant with keppra. Per family had stutter after falling, which has resolved. EMS reports mild confusion on their arrival which as also resolved. Pt c/o head, neck, and back pain. C collar placed by EMS. Does not take blood thinners.

## 2019-12-26 NOTE — Discharge Instructions (Signed)
You were seen in the emergency department for evaluation of injuries from a fall and a possible seizure.  You had a CAT scan of your head neck and lumbar spine that did not show any serious findings.  You have possibly a urinary tract infection and so we are putting you on antibiotics.  He will be important for you to see your neurologist soon as possible because they may want to do some further imaging.  Return to the emergency department if any worsening or concerning symptoms

## 2019-12-28 LAB — URINE CULTURE: Culture: >=100000 — AB

## 2019-12-29 ENCOUNTER — Telehealth: Payer: Self-pay

## 2019-12-29 NOTE — Telephone Encounter (Signed)
Post ED Visit - Positive Culture Follow-up  Culture report reviewed by antimicrobial stewardship pharmacist: [facility] Team []  Elenor Quinones, Pharm.D. []  Heide Guile, Pharm.D., BCPS AQ-ID []  Parks Neptune, Pharm.D., BCPS []  Alycia Rossetti, Pharm.D., BCPS []  [place], Pharm.D., BCPS, AAHIVP []  Legrand Como, Pharm.D., BCPS, AAHIVP []  Salome Arnt, PharmD, BCPS []  Johnnette Gourd, PharmD, BCPS []  Hughes Better, PharmD, BCPS []  Leeroy Cha, PharmD []  Laqueta Linden, PharmD, BCPS []  Albertina Parr, [place] Team []  Leodis Sias, PharmD []  Lindell Spar, PharmD []  Royetta Asal, PharmD []  Graylin Shiver, Rph []  Rema Fendt) Glennon Mac, PharmD []  Arlyn Dunning, PharmD []  Netta Cedars, PharmD []  Dia Sitter, PharmD []  Leone Haven, PharmD []  Gretta Arab, PharmD []  Theodis Shove, PharmD []  Peggyann Juba, PharmD []  Reuel Boom, PharmD   Positive urine culture Treated with Cephalexin, organism sensitive to the same and no further patient follow-up is required at this time.  Genia Del 12/29/2019, 9:29 AM

## 2020-01-13 ENCOUNTER — Ambulatory Visit: Payer: Medicare Other | Admitting: Neurology

## 2020-01-23 ENCOUNTER — Other Ambulatory Visit: Payer: Self-pay | Admitting: Neurology

## 2020-01-24 ENCOUNTER — Other Ambulatory Visit: Payer: Self-pay

## 2020-01-24 MED ORDER — XCOPRI 100 MG PO TABS: 1.0000 | ORAL_TABLET | Freq: Every day | ORAL | 1 refills | Status: DC

## 2020-01-24 MED ORDER — PHENYTOIN SODIUM EXTENDED 100 MG PO CAPS: ORAL_CAPSULE | ORAL | 3 refills | Status: DC

## 2020-01-24 MED ORDER — VITAMIN D 25 MCG (1000 UNIT) PO TABS: 1000.0000 [IU] | ORAL_TABLET | Freq: Every day | ORAL | 3 refills | Status: DC

## 2020-01-24 MED ORDER — DILANTIN 30 MG PO CAPS: 30.0000 mg | ORAL_CAPSULE | Freq: Every morning | ORAL | 3 refills | Status: DC

## 2020-01-24 MED ORDER — LACOSAMIDE 200 MG PO TABS: 200.0000 mg | ORAL_TABLET | Freq: Two times a day (BID) | ORAL | 3 refills | Status: DC

## 2020-01-24 MED ORDER — ESCITALOPRAM OXALATE 10 MG PO TABS: 10.0000 mg | ORAL_TABLET | Freq: Every day | ORAL | 3 refills | Status: DC

## 2020-01-24 MED ORDER — GABAPENTIN 300 MG PO CAPS: ORAL_CAPSULE | ORAL | 3 refills | Status: DC

## 2020-01-29 IMAGING — DX DG LUMBAR SPINE COMPLETE 4+V
6 series · 6 of 6 positions shown · non-contrast
Comparison: 11/13/2016

CLINICAL DATA: Mid back pain after seizure and fall from bed.
History of L1 compression fracture.

EXAM:
LUMBAR SPINE - COMPLETE 4+ VIEW

[l-spine ap]
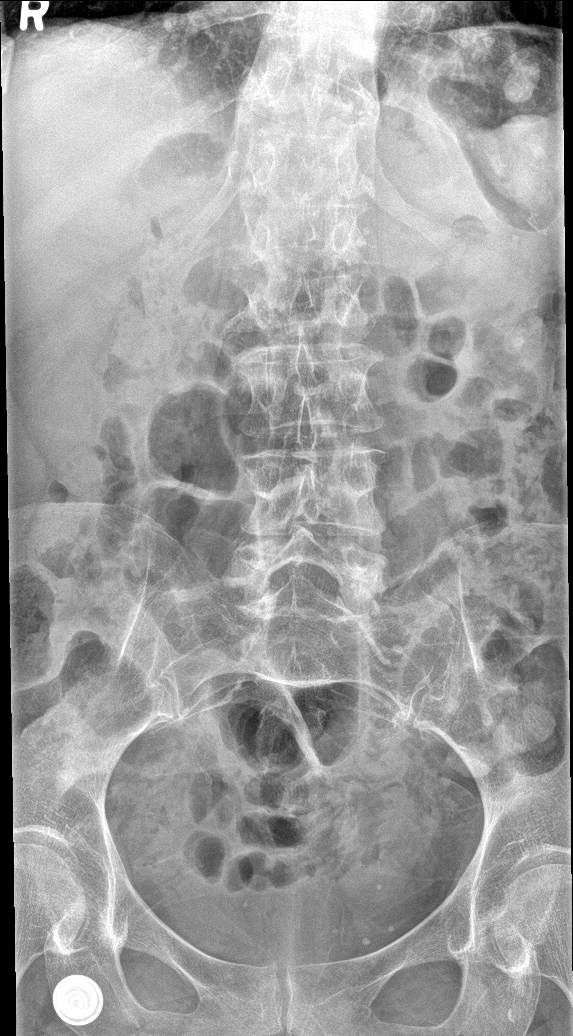

[l-spine obl (1 of 3)]
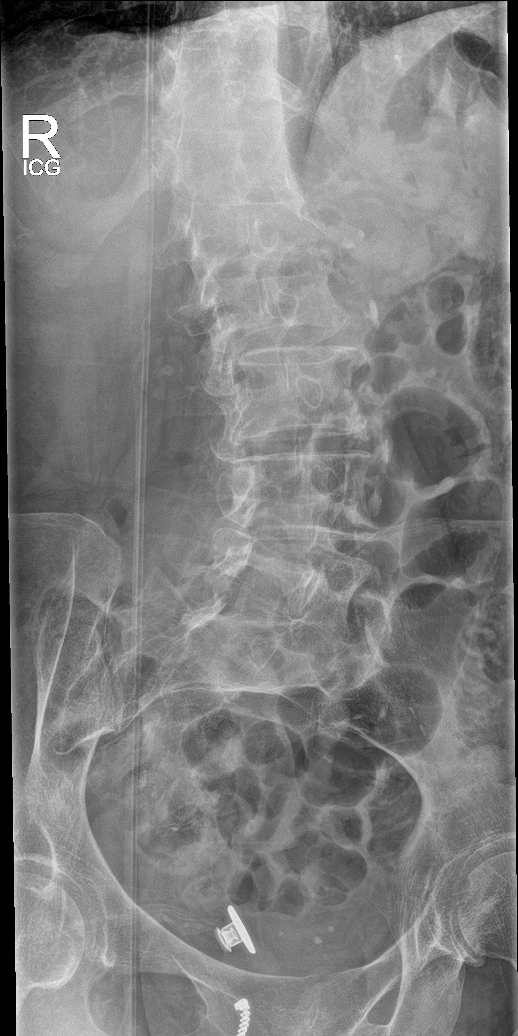

[l-spine obl (2 of 3)]
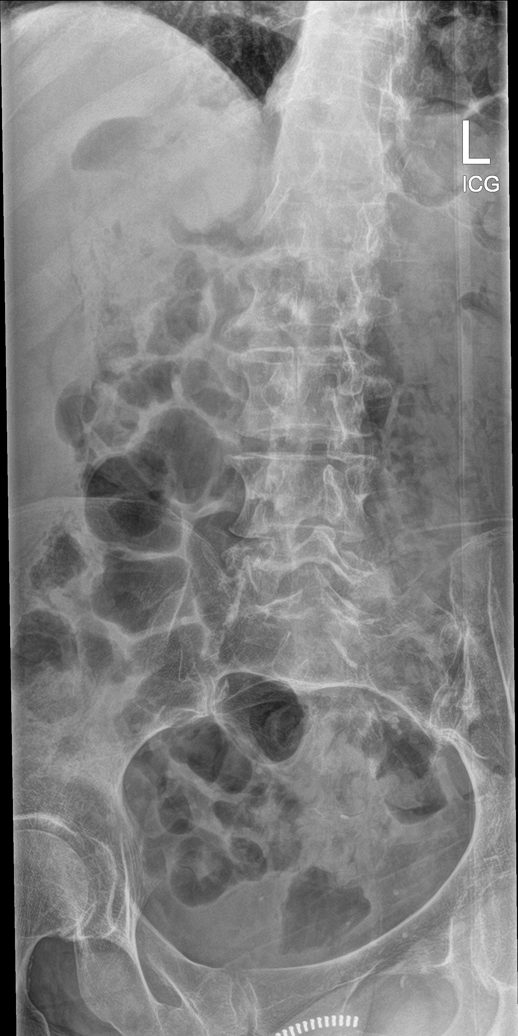

[l-spine lat]
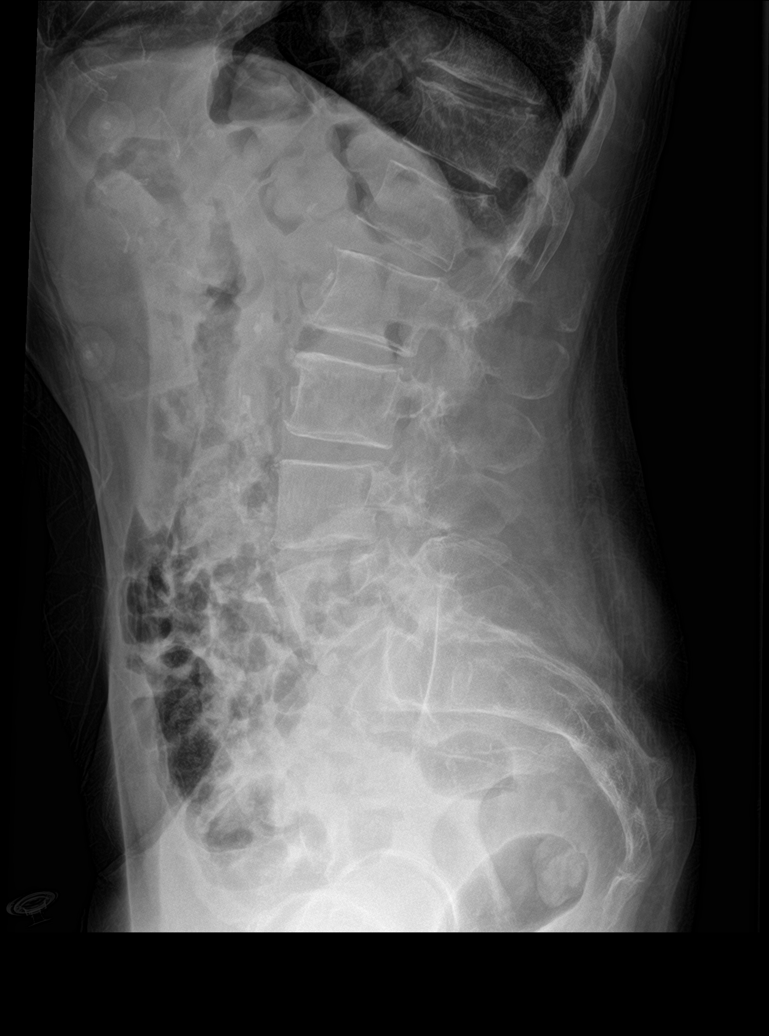

[l-spine spot]
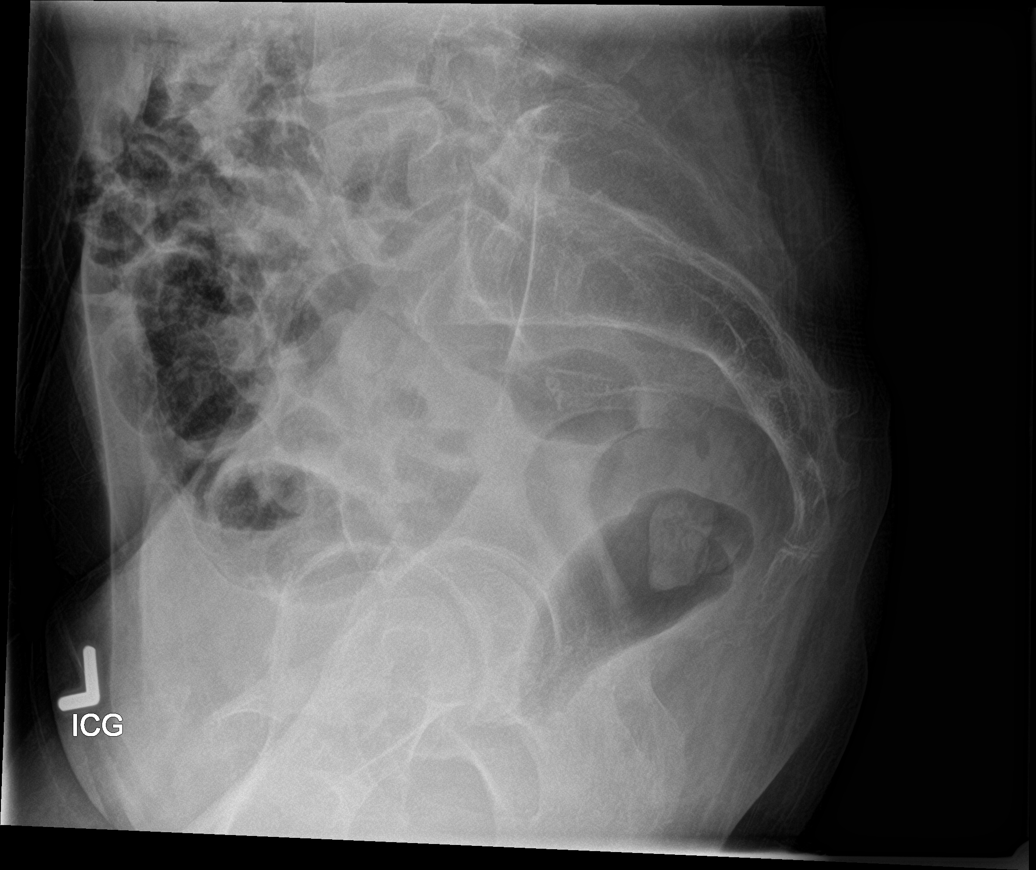

[l-spine obl (3 of 3)]
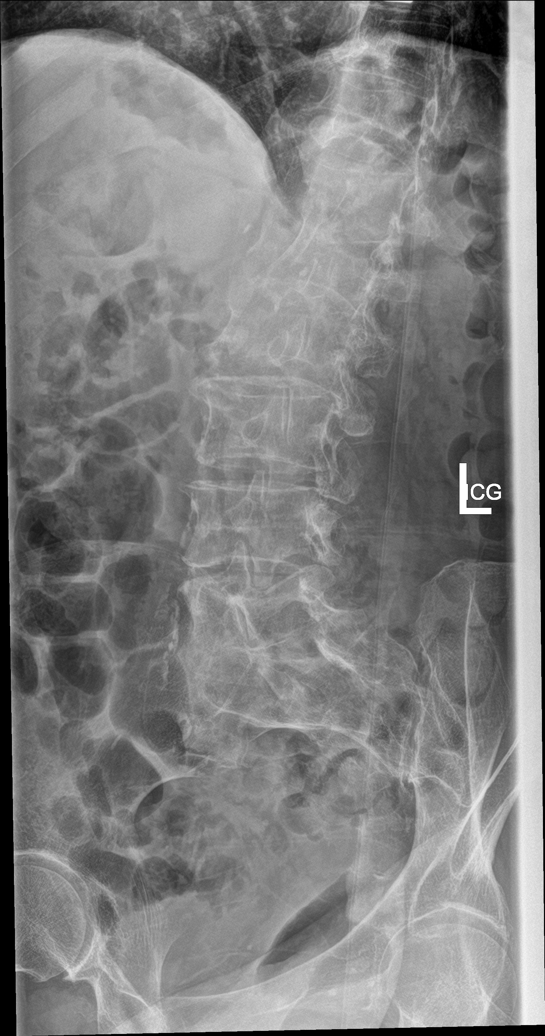

[6 of 6 positions shown; findings below may reference images not displayed]

FINDINGS: Vertebral body alignment is within normal. There is mild spondylosis
throughout the lumbar spine. Mild disc space narrowing at the L4-5
level. Minimal loss of mid anterior vertebral body height of L1
without significant change. No evidence of acute compression
fracture or spondylolisthesis. Facet arthropathy is present over the
lower lumbar spine.
IMPRESSION: No acute findings.

Mild spondylosis of the lumbar spine with disc disease at the L4-5
level. Stable minimal compression deformity L1.

## 2020-01-29 IMAGING — CT CT HEAD W/O CM
4 series · 15 of 47 positions shown, 17 images · non-contrast
Comparison: Prior study from 11/09/2014.

CLINICAL DATA: Initial evaluation for headache, recent fall.

EXAM:
CT HEAD WITHOUT CONTRAST
TECHNIQUE: Contiguous axial images were obtained from the base of the skull
through the vertex without intravenous contrast.

[Series 3: head wo · axial · 0.43mm/px · z∈[-120,+5]mm · 7 of 35 slices shown, 9 images]
[im 5/35  brain]
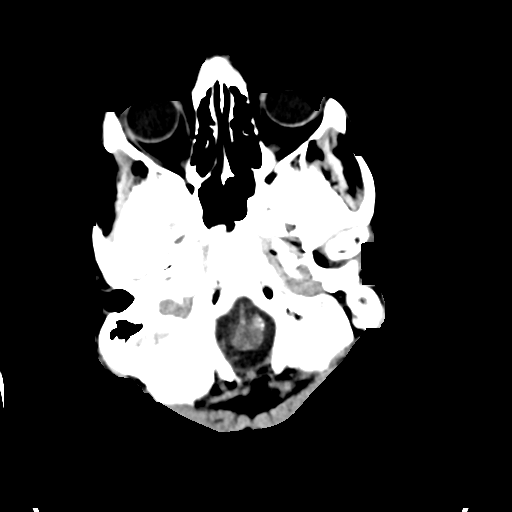
[im 5/35  bone]
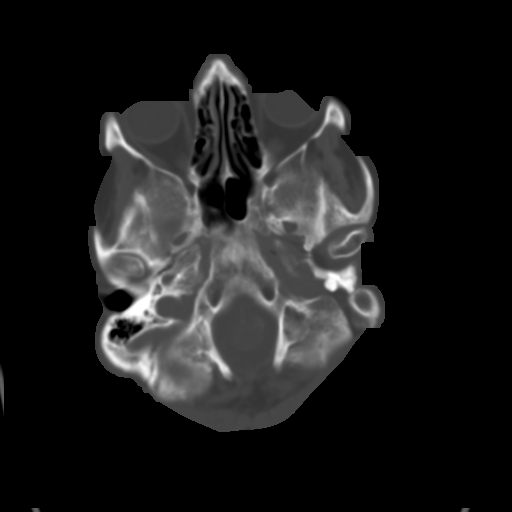
[im 9/35  brain]
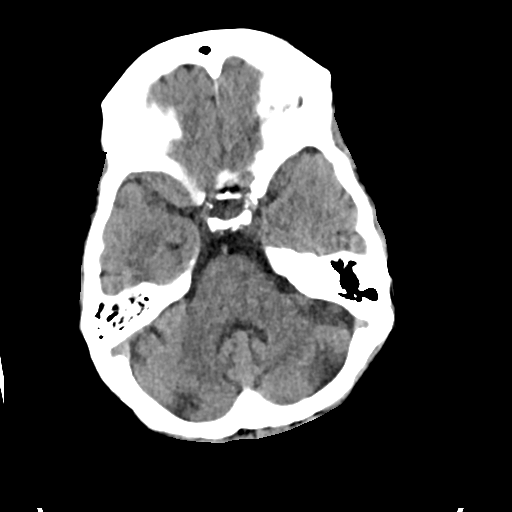
[im 13/35  brain]
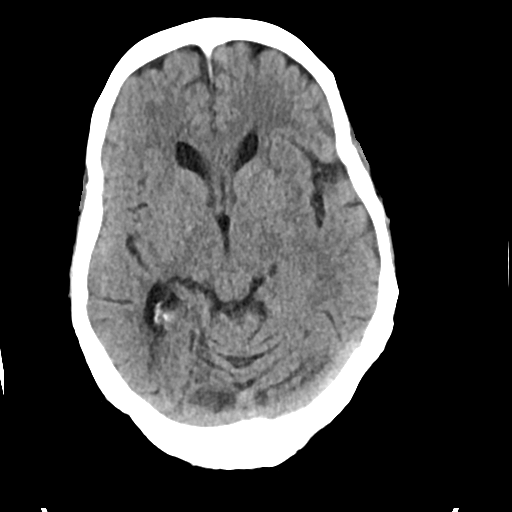
[im 18/35  brain]
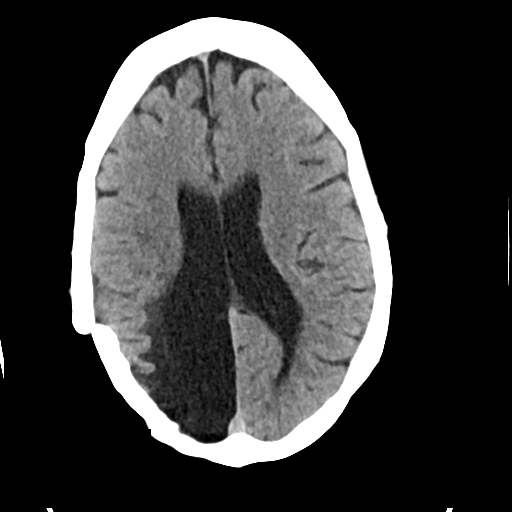
[im 22/35  brain]
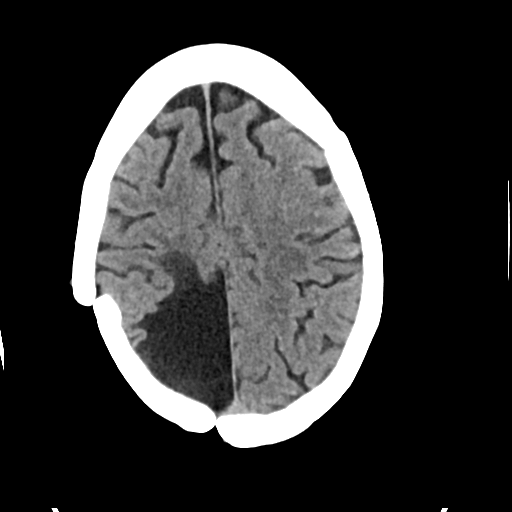
[im 22/35  bone]
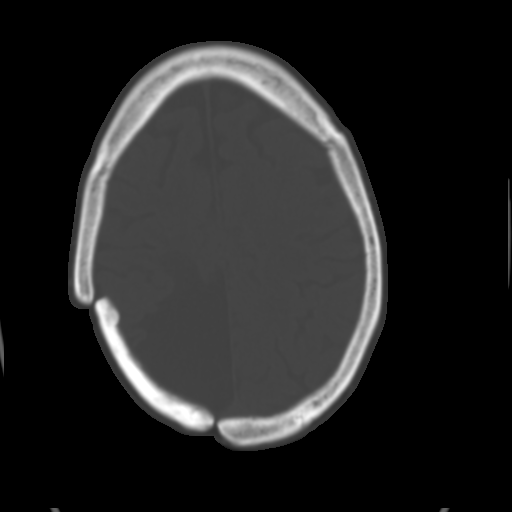
[im 26/35  brain]
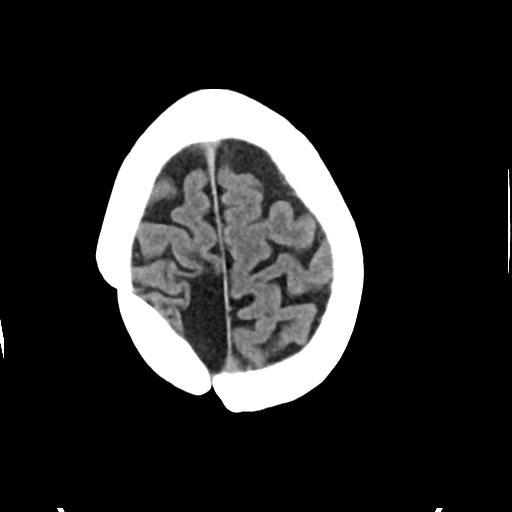
[im 30/35  brain]
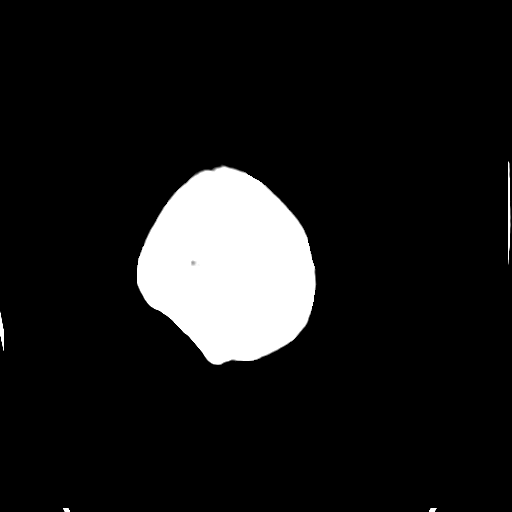

[Series 4: head bone · axial · 0.43mm/px · z∈[-124,-106]mm · 2 of 86 slices shown]
[im 9/86  bone]
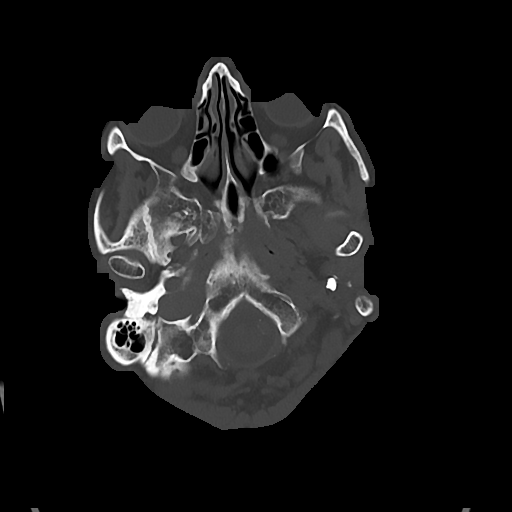
[im 18/86  bone]
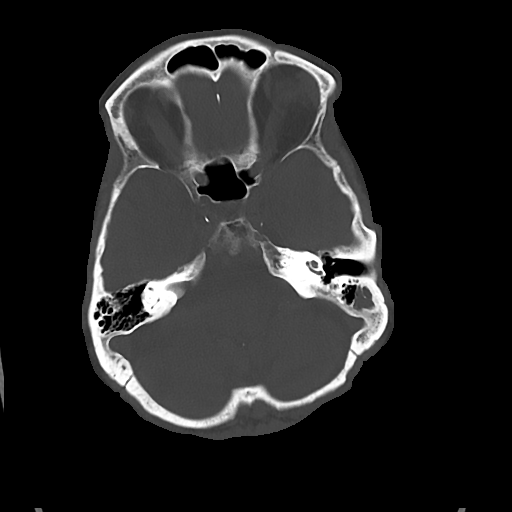

[Series 5: cor soft · coronal · 0.33mm/px · 3 of 86 slices shown]
[im 29/86  brain]
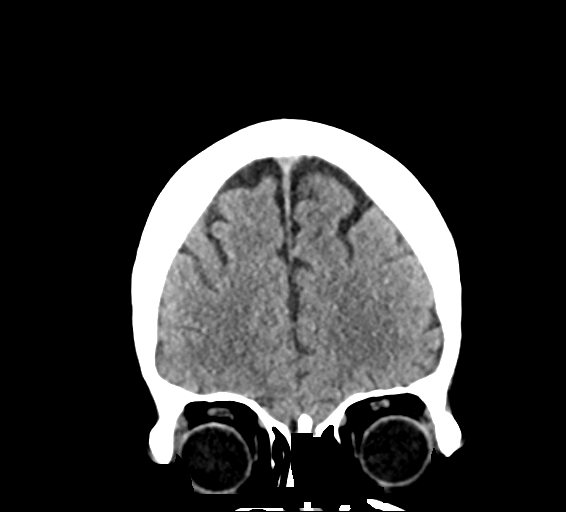
[im 38/86  brain]
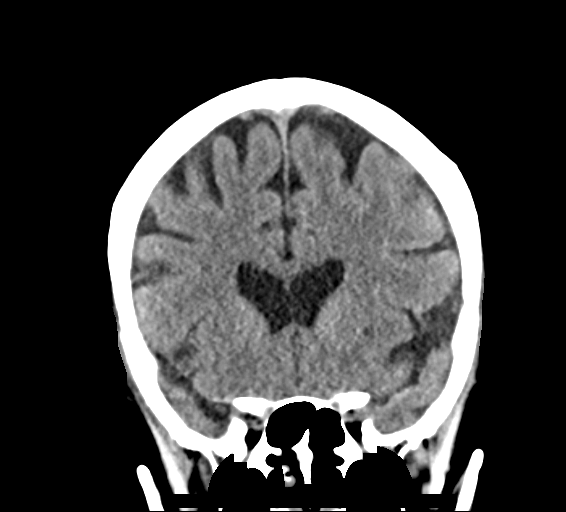
[im 48/86  brain]
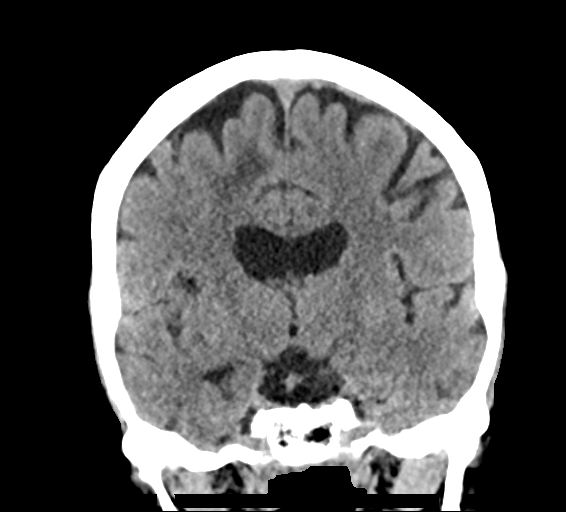

[Series 6: sag soft · sagittal · 0.33mm/px · 3 of 67 slices shown]
[im 23/67  brain]
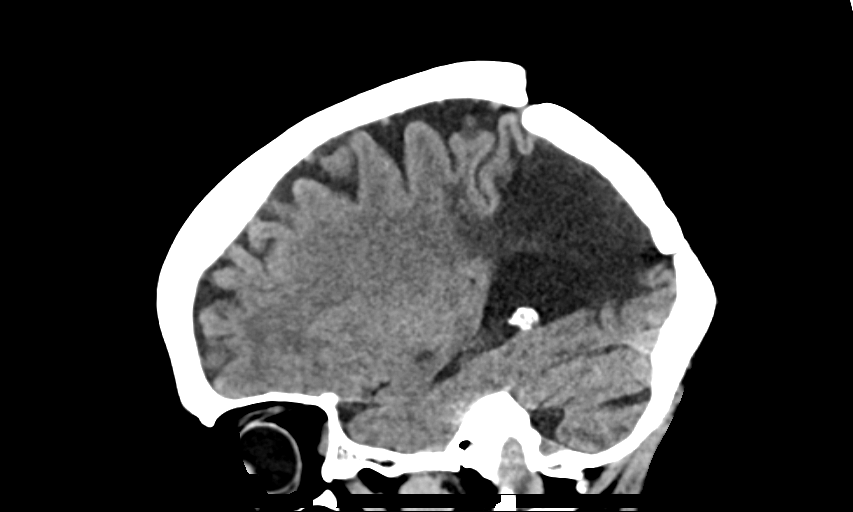
[im 34/67  brain]
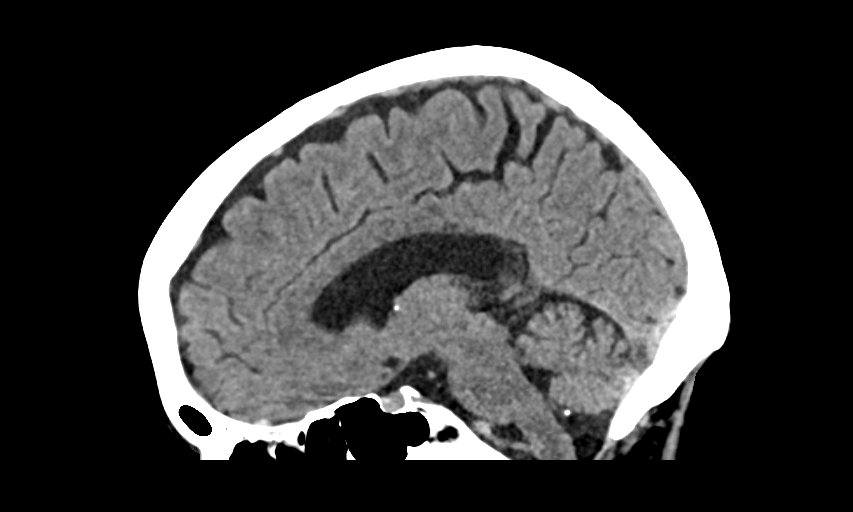
[im 45/67  brain]
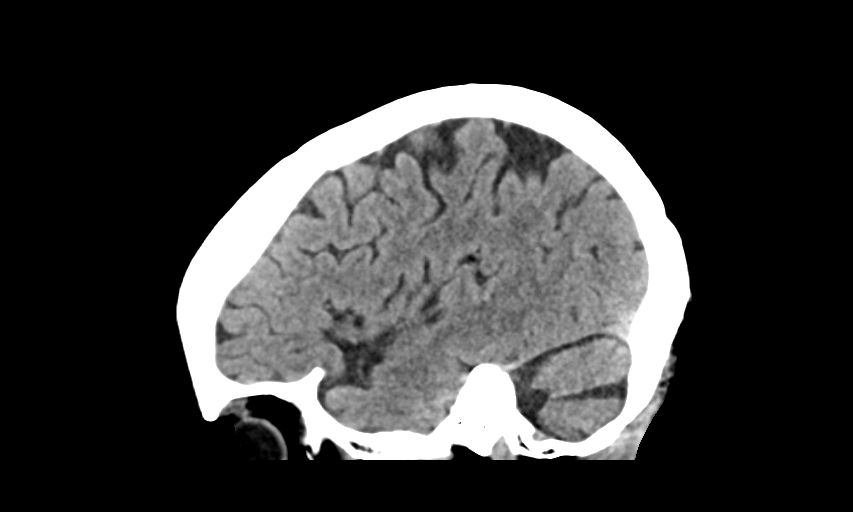

[15 of 47 positions shown; findings below may reference images not displayed]

FINDINGS: Brain: Sequelae of prior right parietal craniotomy with extensive
encephalomalacia within the underlying right parieto-occipital
region, stable. No acute intracranial hemorrhage. No acute large
vessel territory infarct. No mass lesion, midline shift or mass
effect. Ex vacuo dilatation of the right lateral ventricle without
hydrocephalus. No extra-axial fluid collection.

Vascular: No hyperdense vessel. Calcified atherosclerosis at the
skull base.

Skull: Scalp soft tissues demonstrate no acute abnormality. Remote
craniotomy at the right parietal calvarium.

Sinuses/Orbits: Globes and orbital soft tissues within normal
limits. Visualized paranasal sinuses are clear. Chronic left mastoid
effusion.

Other: None.
IMPRESSION: 1. No acute intracranial abnormality.
2. Sequelae of prior right parietal craniotomy with extensive
encephalomalacia within the underlying right parieto-occipital
region, stable.

## 2020-02-01 ENCOUNTER — Ambulatory Visit: Payer: Medicare HMO | Admitting: Neurology

## 2020-02-10 ENCOUNTER — Encounter: Payer: Self-pay | Admitting: Neurology

## 2020-02-14 ENCOUNTER — Telehealth: Payer: Self-pay | Admitting: Neurology

## 2020-02-14 NOTE — Telephone Encounter (Signed)
Patient dismissed from [facility] by Ellouise Newer, MD, effective 02/10/20. Dismissal Letter sent out by 1st class mail. [employer]

## 2020-02-23 ENCOUNTER — Telehealth: Payer: Self-pay | Admitting: Neurology

## 2020-02-23 NOTE — Telephone Encounter (Signed)
Patient's daughter called in to see when the patient's next appointment is. I let her know about being discharged due to the no show policy. She was not aware that her mother was not keeping her appointments. She would like to know if there is any way to reconsider? She would make sure she is in charge of her mother's appointments from now on.

## 2020-02-24 ENCOUNTER — Ambulatory Visit: Payer: Medicare HMO | Admitting: Neurology

## 2020-02-24 NOTE — Telephone Encounter (Signed)
Spoke with daughter who says she did not realize pt was missing appointments. She says the pt has poor short term memory and was not telling her when she had appts scheduled. She reached out to the office to find out when pt had a f/u and was told that pt had been discharged d/t multiple no-shows. She later found the letter notifying pt of this in the back of pts closet unopened. She would like the chance for pt to be seen again with the understanding that she would be in charge of keeping track of her appts. Told her that this would be passed along to Dr Delice Lesch for consideration.

## 2020-02-24 NOTE — Telephone Encounter (Signed)
AccessNurse 02/24/20 @ 12:59PM:  "Caller states that she needs to speak with [name] or her nurse."

## 2020-02-24 NOTE — Telephone Encounter (Signed)
Spoke with pts daughter and told her that decision re continued access to [facility] must be made by the office manager and that she would be notified when the situation has been addressed, she verbalized understanding.

## 2020-02-24 NOTE — Telephone Encounter (Signed)
This will be passed on to our office manager Luiz Iron, pls let her know this is the office policy, not the physicians, will let Hoyle Sauer know about daughter's report. Thanks

## 2020-03-05 NOTE — Telephone Encounter (Signed)
FYI

## 2020-03-05 NOTE — Telephone Encounter (Signed)
I called and spoke to [place] the daughter of the patient [name]. I informed the daughter that since her mother has been dismissed due to no-show/missed appointments that we would not be reversing the dismissal decision. I informed daughter that it may be beneficial for her mother to return to her neurologist at [place] stated that they were not interested in because her mother does not want surgery. [name] stated that she would have her "other" family members care transferred from [facility]. I apologized for the inconvience and asked if there was anything else that we can do. She stated no and disconnected the call.

## 2020-03-16 ENCOUNTER — Ambulatory Visit: Payer: Medicare HMO | Admitting: Neurology

## 2020-03-24 DIAGNOSIS — U071 COVID-19: Secondary | ICD-10-CM | POA: Diagnosis not present

## 2020-03-29 DIAGNOSIS — R0600 Dyspnea, unspecified: Secondary | ICD-10-CM | POA: Diagnosis not present

## 2020-04-02 ENCOUNTER — Ambulatory Visit (HOSPITAL_COMMUNITY)
Admission: RE | Admit: 2020-04-02 | Discharge: 2020-04-02 | Disposition: A | Discharge status: Home / Self Care | Payer: Medicare Other | Source: Ambulatory Visit | Attending: Pulmonary Disease | Admitting: Pulmonary Disease

## 2020-04-02 ENCOUNTER — Other Ambulatory Visit: Payer: Self-pay

## 2020-04-02 ENCOUNTER — Telehealth: Payer: Self-pay | Admitting: Adult Health

## 2020-04-02 ENCOUNTER — Other Ambulatory Visit (HOSPITAL_COMMUNITY): Payer: Self-pay | Admitting: Nurse Practitioner

## 2020-04-02 ENCOUNTER — Encounter (HOSPITAL_COMMUNITY): Payer: Self-pay | Admitting: Emergency Medicine

## 2020-04-02 ENCOUNTER — Inpatient Hospital Stay (HOSPITAL_COMMUNITY)
Admission: EM | Admit: 2020-04-02 | Discharge: 2020-04-07 | DRG: 177 | Disposition: A | Discharge status: Home / Self Care | Payer: Medicare Other | Attending: Internal Medicine | Admitting: Internal Medicine

## 2020-04-02 ENCOUNTER — Emergency Department (HOSPITAL_COMMUNITY): Payer: Medicare Other

## 2020-04-02 DIAGNOSIS — R0902 Hypoxemia: Secondary | ICD-10-CM | POA: Diagnosis not present

## 2020-04-02 DIAGNOSIS — E872 Acidosis, unspecified: Secondary | ICD-10-CM

## 2020-04-02 DIAGNOSIS — J441 Chronic obstructive pulmonary disease with (acute) exacerbation: Secondary | ICD-10-CM | POA: Diagnosis not present

## 2020-04-02 DIAGNOSIS — Z8249 Family history of ischemic heart disease and other diseases of the circulatory system: Secondary | ICD-10-CM | POA: Diagnosis not present

## 2020-04-02 DIAGNOSIS — J440 Chronic obstructive pulmonary disease with acute lower respiratory infection: Secondary | ICD-10-CM | POA: Diagnosis not present

## 2020-04-02 DIAGNOSIS — U071 COVID-19: Secondary | ICD-10-CM | POA: Diagnosis not present

## 2020-04-02 DIAGNOSIS — G629 Polyneuropathy, unspecified: Secondary | ICD-10-CM | POA: Diagnosis not present

## 2020-04-02 DIAGNOSIS — G40909 Epilepsy, unspecified, not intractable, without status epilepticus: Secondary | ICD-10-CM | POA: Diagnosis present

## 2020-04-02 DIAGNOSIS — Z87891 Personal history of nicotine dependence: Secondary | ICD-10-CM | POA: Diagnosis not present

## 2020-04-02 DIAGNOSIS — Z885 Allergy status to narcotic agent status: Secondary | ICD-10-CM | POA: Diagnosis not present

## 2020-04-02 DIAGNOSIS — Z888 Allergy status to other drugs, medicaments and biological substances status: Secondary | ICD-10-CM | POA: Diagnosis not present

## 2020-04-02 DIAGNOSIS — J1282 Pneumonia due to coronavirus disease 2019: Secondary | ICD-10-CM | POA: Diagnosis not present

## 2020-04-02 DIAGNOSIS — G8929 Other chronic pain: Secondary | ICD-10-CM | POA: Diagnosis present

## 2020-04-02 DIAGNOSIS — Z79899 Other long term (current) drug therapy: Secondary | ICD-10-CM

## 2020-04-02 DIAGNOSIS — F419 Anxiety disorder, unspecified: Secondary | ICD-10-CM | POA: Diagnosis present

## 2020-04-02 DIAGNOSIS — Z7952 Long term (current) use of systemic steroids: Secondary | ICD-10-CM

## 2020-04-02 DIAGNOSIS — J9601 Acute respiratory failure with hypoxia: Secondary | ICD-10-CM | POA: Diagnosis not present

## 2020-04-02 DIAGNOSIS — Z86718 Personal history of other venous thrombosis and embolism: Secondary | ICD-10-CM

## 2020-04-02 DIAGNOSIS — Z8673 Personal history of transient ischemic attack (TIA), and cerebral infarction without residual deficits: Secondary | ICD-10-CM

## 2020-04-02 DIAGNOSIS — Z789 Other specified health status: Secondary | ICD-10-CM

## 2020-04-02 DIAGNOSIS — E876 Hypokalemia: Secondary | ICD-10-CM | POA: Diagnosis not present

## 2020-04-02 DIAGNOSIS — K219 Gastro-esophageal reflux disease without esophagitis: Secondary | ICD-10-CM | POA: Diagnosis not present

## 2020-04-02 DIAGNOSIS — Z91018 Allergy to other foods: Secondary | ICD-10-CM | POA: Diagnosis not present

## 2020-04-02 DIAGNOSIS — R0789 Other chest pain: Secondary | ICD-10-CM | POA: Diagnosis not present

## 2020-04-02 HISTORY — DX: COVID-19: U07.1

## 2020-04-02 HISTORY — DX: Acidosis, unspecified: E87.20

## 2020-04-02 LAB — CBC WITH DIFFERENTIAL/PLATELET
Abs Immature Granulocytes: 0.02 10*3/uL (ref 0.00–0.07)
Basophils Absolute: 0 10*3/uL (ref 0.0–0.1)
Basophils Relative: 0 %
Eosinophils Absolute: 0 10*3/uL (ref 0.0–0.5)
Eosinophils Relative: 0 %
HCT: 39.4 % (ref 36.0–46.0)
Hemoglobin: 12.8 g/dL (ref 12.0–15.0)
Immature Granulocytes: 1 %
Lymphocytes Relative: 25 %
Lymphs Abs: 0.7 10*3/uL (ref 0.7–4.0)
MCH: 33.6 pg (ref 26.0–34.0)
MCHC: 32.5 g/dL (ref 30.0–36.0)
MCV: 103.4 fL — ABNORMAL HIGH (ref 80.0–100.0)
Monocytes Absolute: 0.2 10*3/uL (ref 0.1–1.0)
Monocytes Relative: 7 %
Neutro Abs: 2.1 10*3/uL (ref 1.7–7.7)
Neutrophils Relative %: 67 %
Platelets: 127 10*3/uL — ABNORMAL LOW (ref 150–400)
RBC: 3.81 MIL/uL — ABNORMAL LOW (ref 3.87–5.11)
RDW: 12.7 % (ref 11.5–15.5)
WBC: 3 10*3/uL — ABNORMAL LOW (ref 4.0–10.5)
nRBC: 0 % (ref 0.0–0.2)

## 2020-04-02 LAB — COMPREHENSIVE METABOLIC PANEL
ALT: 22 U/L (ref 0–44)
AST: 32 U/L (ref 15–41)
Albumin: 3.4 g/dL — ABNORMAL LOW (ref 3.5–5.0)
Alkaline Phosphatase: 45 U/L (ref 38–126)
Anion gap: 11 (ref 5–15)
BUN: 10 mg/dL (ref 8–23)
CO2: 29 mmol/L (ref 22–32)
Calcium: 8.5 mg/dL — ABNORMAL LOW (ref 8.9–10.3)
Chloride: 101 mmol/L (ref 98–111)
Creatinine, Ser: 0.71 mg/dL (ref 0.44–1.00)
GFR calc Af Amer: >60 mL/min (ref >60)
GFR calc non Af Amer: >60 mL/min (ref >60)
Glucose, Bld: 106 mg/dL — ABNORMAL HIGH (ref 70–99)
Potassium: 3.2 mmol/L — ABNORMAL LOW (ref 3.5–5.1)
Sodium: 141 mmol/L (ref 135–145)
Total Bilirubin: 0.3 mg/dL (ref 0.3–1.2)
Total Protein: 6.6 g/dL (ref 6.5–8.1)

## 2020-04-02 LAB — D-DIMER, QUANTITATIVE: D-Dimer, Quant: 0.63 ug/mL-FEU — ABNORMAL HIGH (ref 0.00–0.50)

## 2020-04-02 LAB — FIBRINOGEN: Fibrinogen: 471 mg/dL (ref 210–475)

## 2020-04-02 LAB — FERRITIN: Ferritin: 279 ng/mL (ref 11–307)

## 2020-04-02 LAB — LACTIC ACID, PLASMA
Lactic Acid, Venous: 2.3 mmol/L (ref 0.5–1.9)
Lactic Acid, Venous: 0.8 mmol/L (ref 0.5–1.9)

## 2020-04-02 LAB — PROCALCITONIN: Procalcitonin: <0.1 ng/mL

## 2020-04-02 LAB — SARS CORONAVIRUS 2 BY RT PCR (HOSPITAL ORDER, PERFORMED IN ~~LOC~~ HOSPITAL LAB): SARS Coronavirus 2: POSITIVE — AB

## 2020-04-02 LAB — TRIGLYCERIDES: Triglycerides: 80 mg/dL (ref <150)

## 2020-04-02 LAB — LACTATE DEHYDROGENASE: LDH: 184 U/L (ref 98–192)

## 2020-04-02 LAB — C-REACTIVE PROTEIN: CRP: 5.9 mg/dL — ABNORMAL HIGH (ref <1.0)

## 2020-04-02 MED ORDER — ALBUTEROL SULFATE HFA 108 (90 BASE) MCG/ACT IN AERS: 2.0000 | INHALATION_SPRAY | Freq: Once | RESPIRATORY_TRACT | Status: DC | PRN

## 2020-04-02 MED ORDER — ALBUTEROL SULFATE HFA 108 (90 BASE) MCG/ACT IN AERS
2.0000 | INHALATION_SPRAY | Freq: Four times a day (QID) | RESPIRATORY_TRACT | Status: DC
  Administered 2020-04-02 – 2020-04-07 (×17): 2 via RESPIRATORY_TRACT
  Filled 2020-04-02 (×4): qty 6.7

## 2020-04-02 MED ORDER — SODIUM CHLORIDE 0.9 % IV SOLN: INTRAVENOUS | Status: DC | PRN

## 2020-04-02 MED ORDER — CENOBAMATE 100 MG PO TABS
100.0000 mg | ORAL_TABLET | Freq: Every day | ORAL | Status: DC
  Administered 2020-04-04: 100 mg via ORAL

## 2020-04-02 MED ORDER — SODIUM CHLORIDE 0.9 % IV SOLN
100.0000 mg | Freq: Every day | INTRAVENOUS | Status: AC
  Administered 2020-04-03 – 2020-04-06 (×4): 100 mg via INTRAVENOUS
  Filled 2020-04-02 (×4): qty 20

## 2020-04-02 MED ORDER — ALBUTEROL SULFATE HFA 108 (90 BASE) MCG/ACT IN AERS: 2.0000 | INHALATION_SPRAY | RESPIRATORY_TRACT | Status: DC | PRN

## 2020-04-02 MED ORDER — METHYLPREDNISOLONE SODIUM SUCC 40 MG IJ SOLR
40.0000 mg | Freq: Two times a day (BID) | INTRAMUSCULAR | Status: DC
  Administered 2020-04-02 – 2020-04-07 (×10): 40 mg via INTRAVENOUS
  Filled 2020-04-02 (×10): qty 1

## 2020-04-02 MED ORDER — ONDANSETRON HCL 4 MG/2ML IJ SOLN: 4.0000 mg | Freq: Four times a day (QID) | INTRAMUSCULAR | Status: DC | PRN

## 2020-04-02 MED ORDER — ACETAMINOPHEN 325 MG PO TABS
650.0000 mg | ORAL_TABLET | Freq: Once | ORAL | Status: AC
  Administered 2020-04-02: 650 mg via ORAL
  Filled 2020-04-02: qty 2

## 2020-04-02 MED ORDER — POTASSIUM CHLORIDE 20 MEQ PO PACK
40.0000 meq | PACK | Freq: Once | ORAL | Status: AC
  Administered 2020-04-02: 40 meq via ORAL
  Filled 2020-04-02: qty 2

## 2020-04-02 MED ORDER — PHENYTOIN SODIUM EXTENDED 30 MG PO CAPS
130.0000 mg | ORAL_CAPSULE | Freq: Every day | ORAL | Status: DC
  Administered 2020-04-03 – 2020-04-04 (×2): 130 mg via ORAL
  Filled 2020-04-02 (×2): qty 1

## 2020-04-02 MED ORDER — GUAIFENESIN-DM 100-10 MG/5ML PO SYRP: 10.0000 mL | ORAL_SOLUTION | ORAL | Status: DC | PRN

## 2020-04-02 MED ORDER — ENOXAPARIN SODIUM 40 MG/0.4ML ~~LOC~~ SOLN
40.0000 mg | SUBCUTANEOUS | Status: DC
  Administered 2020-04-02 – 2020-04-06 (×5): 40 mg via SUBCUTANEOUS
  Filled 2020-04-02 (×5): qty 0.4

## 2020-04-02 MED ORDER — DIPHENHYDRAMINE HCL 50 MG/ML IJ SOLN: 50.0000 mg | Freq: Once | INTRAMUSCULAR | Status: DC | PRN

## 2020-04-02 MED ORDER — ASCORBIC ACID 500 MG PO TABS
500.0000 mg | ORAL_TABLET | Freq: Every day | ORAL | Status: DC
  Administered 2020-04-02 – 2020-04-07 (×6): 500 mg via ORAL
  Filled 2020-04-02 (×6): qty 1

## 2020-04-02 MED ORDER — ESCITALOPRAM OXALATE 10 MG PO TABS
10.0000 mg | ORAL_TABLET | Freq: Every day | ORAL | Status: DC
  Administered 2020-04-03 – 2020-04-07 (×5): 10 mg via ORAL
  Filled 2020-04-02 (×5): qty 1

## 2020-04-02 MED ORDER — ZINC SULFATE 220 (50 ZN) MG PO CAPS
220.0000 mg | ORAL_CAPSULE | Freq: Every day | ORAL | Status: DC
  Administered 2020-04-02 – 2020-04-07 (×6): 220 mg via ORAL
  Filled 2020-04-02 (×6): qty 1

## 2020-04-02 MED ORDER — EPINEPHRINE 0.3 MG/0.3ML IJ SOAJ: 0.3000 mg | Freq: Once | INTRAMUSCULAR | Status: DC | PRN

## 2020-04-02 MED ORDER — SODIUM CHLORIDE 0.9 % IV SOLN: 1200.0000 mg | Freq: Once | INTRAVENOUS | Status: DC

## 2020-04-02 MED ORDER — LACTATED RINGERS IV SOLN: INTRAVENOUS | Status: AC

## 2020-04-02 MED ORDER — ACETAMINOPHEN 325 MG PO TABS
650.0000 mg | ORAL_TABLET | Freq: Four times a day (QID) | ORAL | Status: DC | PRN
  Administered 2020-04-05 – 2020-04-07 (×5): 650 mg via ORAL
  Filled 2020-04-02 (×5): qty 2

## 2020-04-02 MED ORDER — METHYLPREDNISOLONE SODIUM SUCC 125 MG IJ SOLR: 125.0000 mg | Freq: Once | INTRAMUSCULAR | Status: DC | PRN

## 2020-04-02 MED ORDER — LORAZEPAM 1 MG PO TABS
1.0000 mg | ORAL_TABLET | Freq: Every day | ORAL | Status: DC
  Administered 2020-04-02 – 2020-04-06 (×5): 1 mg via ORAL
  Filled 2020-04-02 (×5): qty 1

## 2020-04-02 MED ORDER — GABAPENTIN 300 MG PO CAPS
300.0000 mg | ORAL_CAPSULE | Freq: Every day | ORAL | Status: DC
  Administered 2020-04-02 – 2020-04-06 (×5): 300 mg via ORAL
  Filled 2020-04-02 (×5): qty 1

## 2020-04-02 MED ORDER — ONDANSETRON HCL 4 MG PO TABS: 4.0000 mg | ORAL_TABLET | Freq: Four times a day (QID) | ORAL | Status: DC | PRN

## 2020-04-02 MED ORDER — LACOSAMIDE 50 MG PO TABS
200.0000 mg | ORAL_TABLET | Freq: Two times a day (BID) | ORAL | Status: DC
  Administered 2020-04-02 – 2020-04-07 (×10): 200 mg via ORAL
  Filled 2020-04-02 (×10): qty 4

## 2020-04-02 MED ORDER — SODIUM CHLORIDE 0.9 % IV SOLN
200.0000 mg | Freq: Once | INTRAVENOUS | Status: AC
  Administered 2020-04-02: 200 mg via INTRAVENOUS
  Filled 2020-04-02: qty 200

## 2020-04-02 MED ORDER — PHENYTOIN SODIUM EXTENDED 100 MG PO CAPS
100.0000 mg | ORAL_CAPSULE | Freq: Every day | ORAL | Status: DC
  Administered 2020-04-02 – 2020-04-06 (×5): 100 mg via ORAL
  Filled 2020-04-02 (×5): qty 1

## 2020-04-02 MED ORDER — LACTATED RINGERS IV BOLUS
1000.0000 mL | Freq: Once | INTRAVENOUS | Status: AC
  Administered 2020-04-02: 1000 mL via INTRAVENOUS

## 2020-04-02 MED ORDER — FAMOTIDINE IN NACL 20-0.9 MG/50ML-% IV SOLN: 20.0000 mg | Freq: Once | INTRAVENOUS | Status: DC | PRN

## 2020-04-02 MED ORDER — POLYETHYLENE GLYCOL 3350 17 G PO PACK: 17.0000 g | PACK | Freq: Every day | ORAL | Status: DC | PRN

## 2020-04-02 NOTE — Progress Notes (Signed)
I connected by phone with Ander Gaster on 04/02/2020 at 11:04 AM to discuss the potential use of an new treatment for mild to moderate COVID-19 viral infection in non-hospitalized patients.  This patient is a 64 y.o. female that meets the FDA criteria for Emergency Use Authorization of casirivimab\imdevimab.  Has a (+) direct SARS-CoV-2 viral test result  Has mild or moderate COVID-19   Is ? 64 years of age and weighs ? 40 kg  Is NOT hospitalized due to COVID-19  Is NOT requiring oxygen therapy or requiring an increase in baseline oxygen flow rate due to COVID-19  Is within 10 days of symptom onset  Has at least one of the high risk factor(s) for progression to severe COVID-19 and/or hospitalization as defined in EUA.  Specific high risk criteria : Neurodevelopmental disorder, COPD   I have spoken and communicated the following to the patient or parent/caregiver:  1. FDA has authorized the emergency use of casirivimab\imdevimab for the treatment of mild to moderate COVID-19 in adults and pediatric patients with positive results of direct SARS-CoV-2 viral testing who are 19 years of age and older weighing at least 40 kg, and who are at high risk for progressing to severe COVID-19 and/or hospitalization.  2. The significant known and potential risks and benefits of casirivimab\imdevimab, and the extent to which such potential risks and benefits are unknown.  3. Information on available alternative treatments and the risks and benefits of those alternatives, including clinical trials.  4. Patients treated with casirivimab\imdevimab should continue to self-isolate and use infection control measures (e.g., wear mask, isolate, social distance, avoid sharing personal items, clean and disinfect "high touch" surfaces, and frequent handwashing) according to CDC guidelines.   5. The patient or parent/caregiver has the option to accept or refuse casirivimab\imdevimab .  After reviewing this  information with the patient, The patient agreed to proceed with receiving casirivimab\imdevimab infusion and will be provided a copy of the Fact sheet prior to receiving the infusion.Beckey Rutter, [place], AGNP-C [phone] ([place])

## 2020-04-02 NOTE — ED Notes (Signed)
Date and time results received: 04/02/20 6:35 PM   Test: LACTIC Critical Value: 2.3  Name of Provider Notified: Melina Copa, md

## 2020-04-02 NOTE — H&P (Addendum)
History and Physical    [name] QQV:[id number] DOB: [birth year] DOA: 04/02/2020  PCP: [name], MD  Patient coming from: Home   Chief Complaint:  Chief Complaint  Patient presents with  . 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 emergency department with complaints of cough.  Patient is a rather poor historian and some portions of the history have been obtained from a combination of emergency department documentation and discussion with the daughter via phone conversation.  Patient explains that approximately 1 week ago her son-in-law came home from a trip, "not feeling well" eventually tested positive for COVID-19.  Several days later, patient began to develop generalized muscle aches, fatigue and nonproductive cough.  Patient underwent outpatient testing for COVID-19 on 8/26 and was found to be positive.  Throughout the weekend, patient developed progressively worsening cough, generalized body aches and poor appetite.  Patient denies associated fevers or shortness of breath.  However, patient has been experiencing occasional generalized abdominal cramping and several bouts of watery diarrhea.  Patient eventually presented to a [place] infusion clinic for monoclonal antibody infusion earlier in the day on 8/30 but upon arrival was noted to be short of breath with oxygen saturations of between 82 and 86%.  The patient was then sent to [facility] emergency department for evaluation.  Upon evaluation in the emergency department, COVID-19 testing was confirmed to be positive.  Patient was found to be suffering from a lactic acidosis with lactate of 2.3.  Patient was also found to be hypoxic requiring 3 L of supplemental oxygen via nasal cannula.  The hospitalist group was then called to assess patient for admission to  hospital.    Review of Systems: Review of Systems  Constitutional: Positive for malaise/fatigue.  Respiratory: Positive for cough and shortness of breath.   Gastrointestinal: Positive for abdominal pain and diarrhea.  Neurological: Positive for weakness.  All other systems reviewed and are negative.     Past Medical History:  Diagnosis Date  . Asthma   . Cerebral hemorrhage ([place]) 1989  . Chronic back pain   . COPD (chronic obstructive pulmonary disease) ([place])   . DDD (degenerative disc disease) 06/12/2013  . DVT (deep venous thrombosis) ([place])    "she's had several since 1990"  . GERD (gastroesophageal reflux disease)   . Headache    "usually around time when she's had a seizure"  . History of blood transfusion    "when she was a baby"  . History of stomach ulcers   . Kidney stones   . Neuropathy   . Seizures ([place])    "because of her brain surgery"  . Stroke [facility]) [phone]   family denies residual on 11/09/2014  . Tunnel vision    "since brain OR"    Past Surgical History:  Procedure Laterality Date  . [place]   craniotomy with hematoma evacuation  . CESAREAN SECTION  1979; 1987; 1989  . HEMORRHOID SURGERY    . TUBAL LIGATION  1990's     reports that she quit smoking about 2 years ago. Her smoking use included cigarettes. She has a 10.00 pack-year smoking history. She has never used smokeless tobacco. She reports that she does not drink alcohol and does not use drugs.  Allergies  Allergen Reactions  . Zonisamide Other (See Comments)  Numbness and tingling over whole body  . Chocolate Flavor Other (See Comments)    Trigger for Seizure  . Codeine Nausea And Vomiting  . Keppra [Levetiracetam] Other (See Comments)    Causes seizures  . Pregabalin Nausea And Vomiting  . Olive Oil Rash    Family History  Problem Relation Age of Onset  . Heart attack Mother        4 MIs, started in her 56s, also vaginal cancer and colon cancer  . Cancer Mother   .  Heart attack Father        Died suddenly age 34 - autopsy revealed heart attack her patient  . Cancer Brother        Sinus cancer     Prior to Admission medications   Medication Sig Start Date End Date Taking? Authorizing Provider  acetaminophen (TYLENOL) 500 MG tablet Take 1,000 mg by mouth every 6 (six) hours as needed for mild pain.   Yes [provider]  albuterol (VENTOLIN HFA) 108 (90 Base) MCG/ACT inhaler Inhale 1 puff into the lungs every 4 (four) hours as needed for wheezing or shortness of breath.  03/30/20  Yes [provider]  Cenobamate (XCOPRI) 100 MG TABS Take 1 tablet by mouth daily. 01/24/20  Yes [name], MD  cholecalciferol (VITAMIN D3) 25 MCG (1000 UNIT) tablet Take 1 tablet (1,000 Units total) by mouth daily. 01/24/20  Yes [name], MD  [employer] B-12 500 MCG tablet TAKE 2 TABLETS BY MOUTH EVERY DAY 01/24/20  Yes [name], MD  DILANTIN 30 MG ER capsule Take 1 capsule (30 mg total) by mouth in the morning. Along with 100mg  capsule Patient taking differently: Take 30 mg by mouth in the morning. Along with 100mg  capsule=130 mg 01/24/20  Yes [name], MD  escitalopram (LEXAPRO) 10 MG tablet Take 1 tablet (10 mg total) by mouth daily. 01/24/20  Yes [name], MD  gabapentin (NEURONTIN) 300 MG capsule Take 1 capsule every night 01/24/20  Yes [name], MD  lacosamide (VIMPAT) 200 MG TABS tablet Take 1 tablet (200 mg total) by mouth 2 (two) times daily. 01/24/20  Yes [name], MD  LORazepam (ATIVAN) 1 MG tablet Take 1 tablet as needed for seizure. Do not take more than 2 in 24 hours. Patient taking differently: Take 1 mg by mouth at bedtime. Do not take more than 2 in 24 hours. 09/13/19  Yes [name], MD  Multiple Vitamins-Minerals (ZINC PO) Take 1 tablet by mouth daily.   Yes [provider]  phenytoin (DILANTIN) 100 MG ER capsule 100mg  in AM, 130mg  in PM Patient taking differently: Take 100 mg by mouth 2 (two)  times daily. Take along with 30 mg=130 mg in AM and Take 100 mg in PM 01/24/20  Yes [name], MD  predniSONE (DELTASONE) 20 MG tablet Take 20 mg by mouth daily with breakfast.  03/29/20  Yes [provider]  cephALEXin (KEFLEX) 250 MG capsule Take 1 capsule (250 mg total) by mouth 4 (four) times daily. Patient not taking: Reported on 04/02/2020 12/26/19   Hayden Rasmussen, MD  Cyanocobalamin (B-12) 1000 MCG TABS Take 1 tablet (1,000 mcg total) by mouth daily. Patient not taking: Reported on 04/02/2020 12/12/19   [name], MD    Physical Exam: Vitals:   04/02/20 1800 04/02/20 1830 04/02/20 1900 04/02/20 2030  BP: 106/82 (!) 116/53 (!) 117/57 108/60  Pulse: 83 81 83 67  Resp: 18 20 (!) 24 (!) 22  Temp:      TempSrc:      SpO2: 97% 97% 95% 93%  Weight:      Height:        Constitutional: Acute alert and oriented x3, no associated distress.   Skin: no rashes, no lesions, notable poor skin turgor Eyes: Pupils are equally reactive to light.  No evidence of scleral icterus or conjunctival pallor.  ENMT: Dry mucous membranes noted.  Posterior pharynx clear of any exudate or lesions.   Neck: normal, supple, no masses, no thyromegaly.  No evidence of jugular venous distension.   Respiratory: Bibasilar mid field rales with notable expiratory wheezing and prolonged expiratory phase.   Normal respiratory effort. No accessory muscle use.  Cardiovascular: Regular rate and rhythm, no murmurs / rubs / gallops. No extremity edema. 2+ pedal pulses. No carotid bruits.  Chest:   Nontender without crepitus or deformity.   Back:   Nontender without crepitus or deformity. Abdomen: Abdomen is soft and nontender.  No evidence of intra-abdominal masses.  Positive bowel sounds noted in all quadrants.   Musculoskeletal: No joint deformity upper and lower extremities. Good ROM, no contractures. Normal muscle tone.  Neurologic: CN 2-12 grossly intact. Sensation intact, strength noted to be 5 out  of 5 in all 4 extremities.  Patient is following all commands.  Patient is responsive to verbal stimuli.  Psychiatric: Patient presents as a normal mood with appropriate affect.  Patient seems to possess insight as to theircurrent situation.     Labs on Admission: I have personally reviewed following labs and imaging studies -   CBC: Recent Labs  Lab 04/02/20 1730  WBC 3.0*  NEUTROABS 2.1  HGB 12.8  HCT 39.4  MCV 103.4*  PLT 734*   Basic Metabolic Panel: Recent Labs  Lab 04/02/20 1730  NA 141  K 3.2*  CL 101  CO2 29  GLUCOSE 106*  BUN 10  CREATININE 0.71  CALCIUM 8.5*   GFR: Estimated Creatinine Clearance: 70.9 mL/min (by C-G formula based on SCr of 0.71 mg/dL). Liver Function Tests: Recent Labs  Lab 04/02/20 1730  AST 32  ALT 22  ALKPHOS 45  BILITOT 0.3  PROT 6.6  ALBUMIN 3.4*   No results for input(s): LIPASE, AMYLASE in the last 168 hours. No results for input(s): AMMONIA in the last 168 hours. Coagulation Profile: No results for input(s): INR, PROTIME in the last 168 hours. Cardiac Enzymes: No results for input(s): CKTOTAL, CKMB, CKMBINDEX, TROPONINI in the last 168 hours. BNP (last 3 results) No results for input(s): PROBNP in the last 8760 hours. HbA1C: No results for input(s): HGBA1C in the last 72 hours. CBG: No results for input(s): GLUCAP in the last 168 hours. Lipid Profile: Recent Labs    04/02/20 1730  TRIG 80   Thyroid Function Tests: No results for input(s): TSH, T4TOTAL, FREET4, T3FREE, THYROIDAB in the last 72 hours. Anemia Panel: Recent Labs    04/02/20 1730  FERRITIN 279   Urine analysis:    Component Value Date/Time   COLORURINE YELLOW 12/26/2019 1330   APPEARANCEUR CLEAR 12/26/2019 1330   LABSPEC 1.006 12/26/2019 1330   PHURINE 8.0 12/26/2019 1330   GLUCOSEU NEGATIVE 12/26/2019 1330   HGBUR NEGATIVE 12/26/2019 1330   BILIRUBINUR NEGATIVE 12/26/2019 1330   KETONESUR NEGATIVE 12/26/2019 1330   PROTEINUR NEGATIVE  12/26/2019 1330   UROBILINOGEN 0.2 01/17/2015 2350   NITRITE POSITIVE (A) 12/26/2019 1330   LEUKOCYTESUR NEGATIVE 12/26/2019 1330  Radiological Exams on Admission - Personally Reviewed: DG Chest Port 1 View  Result Date: 04/02/2020 CLINICAL DATA:  Hypoxia EXAM: PORTABLE CHEST 1 VIEW COMPARISON:  08/26/2019 FINDINGS: The heart size and mediastinal contours are within normal limits. Both lungs are clear. The visualized skeletal structures are unremarkable. IMPRESSION: No active disease. Electronically Signed   By: Fidela Salisbury MD   On: 04/02/2020 18:10    EKG: Personally reviewed.  Rhythm is normal sinus rhythm with heart rate of 87 bpm.  No dynamic ST segment changes appreciated.  Assessment/Plan Principal Problem:   COVID-19 virus infection   Patient presenting with several day history of cough, generalized malaise, weakness and shortness of breath.  Patient found to be hypoxic in infusion clinic earlier today prompting presentation to our emergency department.  Patient positive COVID-19 which was originally positive last Thursday according to emergency department staff and has been confirmed during this presentation  Patient exhibiting concurrent acute hypoxic respiratory failure and superimposed COPD exacerbation  Providing patient with supplemental oxygen to maintain oxygen saturations of near 94%, currently requiring 3 L via nasal cannula.  Providing patient with scheduled bronchodilator therapy via MDI  Initiating intravenous remdesivir and Solu-Medrol  If patient clinically deteriorates will consider initiation of Actemra or Baricitinib   Procalcitonin is normal and therefore antibacterials will not be initiated.  Providing patient with vitamin C and zinc   Active Problems:   COPD with exacerbation ([place])   Please see assessment and plan above    Acute respiratory failure with hypoxia (HCC)   Please see assessment and plan above    Lactic  acidosis   Patient found to be suffering from mild lactic acidosis with initial lactate of 2.3  Hydrating patient with intravenous isotonic fluids, treating underlying COVID-19 infection  Performing serial lactic acid levels to ensure downtrending and resolution    Seizure disorder (HCC)   Complicated history of seizure disorder secondary to remote history of right parieto-occipital AVM rupture status post craniotomy 1998  Patient is on extensive antiepileptic regimen which will be continued.    Hypokalemia, inadequate intake    Replacing with oral potassium chloride  Monitoring potassium levels with serial chemistries.   Code Status:  Full code Family Communication: Daughter Olivia Mackie was contacted via phone conversation and updated on plan of care.  Status is: Inpatient  Remains inpatient appropriate because:IV treatments appropriate due to intensity of illness or inability to take PO and Inpatient level of care appropriate due to severity of illness   Dispo: The patient is from: Home              Anticipated d/c is to: Home              Anticipated d/c date is: > 3 days              Patient currently is not medically stable to d/c.        Vernelle Emerald MD [facility] Pager [phone]  If 7PM-7AM, please contact night-coverage [email or web address] Use universal [place] password for that web site. If you do not have the password, please call the hospital operator.  04/02/2020, 9:08 PM

## 2020-04-02 NOTE — ED Provider Notes (Signed)
[facility] DEPT Provider Note   CSN: [id number] Arrival date & time: 04/02/20  1652     History Chief Complaint  Patient presents with  . hypoxia    [name] is a 64 y.o. female presenting for evaluation hypoxia.  Level 5 caveat due to short-term memory loss.   Patient states only developed symptoms and tested positive for Covid 4 days ago.  She was set up at [facility] antibody infusion center for today.  When she arrived, she was found to have low oxygen levels, and sent to the ER.  She reports her symptoms are consistent headache, cough, and intermittent shortness of breath, mostly with exertion.  She denies chest pain, shortness of breath at rest, nausea, vomiting, abdominal pain, urinary symptoms, no problems.  She is has not received the covid vaccines.  She is living with someone who is also Covid positive.  HPI     Past Medical History:  Diagnosis Date  . Asthma   . Cerebral hemorrhage ([place]) 1989  . Chronic back pain   . COPD (chronic obstructive pulmonary disease) ([place])   . DDD (degenerative disc disease) 06/12/2013  . DVT (deep venous thrombosis) ([place])    "she's had several since 1990"  . GERD (gastroesophageal reflux disease)   . Headache    "usually around time when she's had a seizure"  . History of blood transfusion    "when she was a baby"  . History of stomach ulcers   . Kidney stones   . Neuropathy   . Seizures ([place])    "because of her brain surgery"  . Stroke [facility]) [phone]   family denies residual on 11/09/2014  . Tunnel vision    "since brain OR"    Patient Active Problem List   Diagnosis Date Noted  . Ataxia 08/26/2019  . Elevated Dilantin level 08/26/2019  . Closed fracture of proximal phalanx of great toe 01/04/2019  . Closed fracture of fifth metatarsal bone 01/04/2019  . Pain in left foot 01/04/2019  . Altered mental status   . Weakness of both lower extremities   . Generalized weakness 05/25/2018  .  History of CVA (cerebrovascular accident)   . Pain   . Chronic back pain   . Neuropathy   . Orthostasis   . Tobacco abuse   . Left-sided weakness 03/19/2018  . Atypical chest pain 02/17/2017  . Localized swelling of lower extremity 02/17/2017  . GERD (gastroesophageal reflux disease) 02/17/2017  . Costochondritis 02/17/2017  . Seizures ([place]) 11/02/2015  . Gait disturbance 11/02/2015  . Right clavicle fracture 11/02/2015  . Urinary tract infection associated with catheterization of urinary tract, initial encounter ([place]) 11/02/2015  . Chest pain 03/31/2015  . Acute on chronic respiratory failure with hypoxia ([place]) 03/31/2015  . SOB (shortness of breath) 03/31/2015  . Malnutrition of moderate degree ([place]) 03/31/2015  . COPD with exacerbation ([place]) 03/30/2015  . Hypoxia 01/18/2015  . Chronic obstructive pulmonary disease with acute exacerbation ([place])   . Fever 01/17/2015  . Cough 01/17/2015  . Sepsis secondary to UTI ([place]) 01/17/2015  . COPD (chronic obstructive pulmonary disease) ([place])   . Unintentional weight loss 01/11/2015  . Anxiety 10/22/2014  . Asthma 05/15/2014  . Seizure ([place]) 05/15/2014  . Protein calorie malnutrition ([place]) 05/15/2014  . Arteriovenous malformation of brain 11/17/2013  . Partial epilepsy with impairment of consciousness, intractable ([place]) 11/17/2013  . Stroke due to intracerebral hemorrhage ([place]) 11/17/2013  . DVT of leg (deep venous thrombosis) (  [place]) 06/19/2013  . DDD (degenerative disc disease) 06/12/2013  . Paresthesias 06/12/2013  . Tremor 06/09/2013  . Thrombocytopenia- chronic 06/29/2012  . Noncompliance with medication treatment due to underuse of medication 06/29/2012  . Stress-personal 06/29/2012  . Recurrent seizures ([place]) 06/29/2012  . Tobacco dependence 06/28/2012  . Skull fracture ([place]) 02/28/2012  . Dilantin toxicity 02/28/2012  . Seizure disorder ([place]) 02/28/2012  . History of Intracranial bleed 02/28/2012    Past Surgical History:   Procedure Laterality Date  . [place]   craniotomy with hematoma evacuation  . CESAREAN SECTION  1979; 1987; 1989  . HEMORRHOID SURGERY    . TUBAL LIGATION  1990's     OB History   No obstetric history on file.     Family History  Problem Relation Age of Onset  . Heart attack Mother        4 MIs, started in her 73s, also vaginal cancer and colon cancer  . Cancer Mother   . Heart attack Father        Died suddenly age 6 - autopsy revealed heart attack her patient  . Cancer Brother        Sinus cancer    Social History   Tobacco Use  . Smoking status: Former Smoker    Packs/day: 0.50    Years: 20.00    Pack years: 10.00    Types: Cigarettes    Quit date: 10/26/2017    Years since quitting: 2.4  . Smokeless tobacco: Never Used  . Tobacco comment: Previously smoked 2 ppd, down to 4 cigs/day  Vaping Use  . Vaping Use: Never used  Substance Use Topics  . Alcohol use: No  . Drug use: No    Home Medications Prior to Admission medications   Medication Sig Start Date End Date Taking? Authorizing Provider  acetaminophen (TYLENOL) 500 MG tablet Take 1,000 mg by mouth every 6 (six) hours as needed for mild pain.    [provider]  Cenobamate (XCOPRI) 100 MG TABS Take 1 tablet by mouth daily. 01/24/20   [name], MD  cephALEXin (KEFLEX) 250 MG capsule Take 1 capsule (250 mg total) by mouth 4 (four) times daily. 12/26/19   Hayden Rasmussen, MD  cholecalciferol (VITAMIN D3) 25 MCG (1000 UNIT) tablet Take 1 tablet (1,000 Units total) by mouth daily. 01/24/20   [name], MD  [employer] B-12 500 MCG tablet TAKE 2 TABLETS BY MOUTH EVERY DAY 01/24/20   [name], MD  Cyanocobalamin (B-12) 1000 MCG TABS Take 1 tablet (1,000 mcg total) by mouth daily. Patient taking differently: Take 1,000 mcg by mouth in the morning and at bedtime.  12/12/19   [name], MD  DILANTIN 30 MG ER capsule Take 1 capsule (30 mg total) by mouth in the morning. Along with  100mg  capsule 01/24/20   [name], MD  escitalopram (LEXAPRO) 10 MG tablet Take 1 tablet (10 mg total) by mouth daily. 01/24/20   [name], MD  gabapentin (NEURONTIN) 300 MG capsule Take 1 capsule every night 01/24/20   [name], MD  lacosamide (VIMPAT) 200 MG TABS tablet Take 1 tablet (200 mg total) by mouth 2 (two) times daily. 01/24/20   [name], MD  LORazepam (ATIVAN) 1 MG tablet Take 1 tablet as needed for seizure. Do not take more than 2 in 24 hours. 09/13/19   [name], MD  phenytoin (DILANTIN) 100 MG ER capsule 100mg   in AM, 130mg  in PM 01/24/20   [name], MD    Allergies    Zonisamide, Chocolate flavor, Codeine, Keppra [levetiracetam], Pregabalin, and Olive oil  Review of Systems   Review of Systems  Respiratory: Positive for cough and shortness of breath (intermittent).   Neurological: Positive for headaches.  All other systems reviewed and are negative.   Physical Exam Updated Vital Signs BP 106/82   Pulse 83   Temp (!) 102.4 F (39.1 C) (Oral)   Resp 18   Ht 5\' 7"  (1.702 m)   Wt 63 kg   SpO2 97%   BMI 21.75 kg/m   Physical Exam Vitals and nursing note reviewed.  Constitutional:      General: She is not in acute distress.    Appearance: She is well-developed.     Comments: Appears nontoxic  HENT:     Head: Normocephalic and atraumatic.  Eyes:     Extraocular Movements: Extraocular movements intact.     Conjunctiva/sclera: Conjunctivae normal.     Pupils: Pupils are equal, round, and reactive to light.  Cardiovascular:     Rate and Rhythm: Normal rate and regular rhythm.     Pulses: Normal pulses.  Pulmonary:     Effort: Pulmonary effort is normal. No respiratory distress.     Breath sounds: Normal breath sounds. No wheezing.     Comments: Speaking in full sentences. SpO2 upper 90's on 3 L via [place]. No respiratory distress or accessory muscle use.  Abdominal:     General: There is no distension.     Palpations: Abdomen is  soft. There is no mass.     Tenderness: There is no abdominal tenderness. There is no guarding or rebound.  Musculoskeletal:        General: Normal range of motion.     Cervical back: Normal range of motion and neck supple.  Skin:    General: Skin is warm and dry.     Capillary Refill: Capillary refill takes less than 2 seconds.  Neurological:     Mental Status: She is alert and oriented to person, place, and time.     ED Results / Procedures / Treatments   Labs (all labs ordered are listed, but only abnormal results are displayed) Labs Reviewed  LACTIC ACID, PLASMA - Abnormal; Notable for the following components:      Result Value   Lactic Acid, Venous 2.3 (*)    All other components within normal limits  CBC WITH DIFFERENTIAL/PLATELET - Abnormal; Notable for the following components:   WBC 3.0 (*)    RBC 3.81 (*)    MCV 103.4 (*)    Platelets 127 (*)    All other components within normal limits  COMPREHENSIVE METABOLIC PANEL - Abnormal; Notable for the following components:   Potassium 3.2 (*)    Glucose, Bld 106 (*)    Calcium 8.5 (*)    Albumin 3.4 (*)    All other components within normal limits  D-DIMER, QUANTITATIVE (NOT AT [facility]) - Abnormal; Notable for the following components:   D-Dimer, Quant 0.63 (*)    All other components within normal limits  CULTURE, BLOOD (ROUTINE X 2)  CULTURE, BLOOD (ROUTINE X 2)  SARS CORONAVIRUS 2 BY RT PCR (HOSPITAL ORDER, [facility])  LACTATE DEHYDROGENASE  TRIGLYCERIDES  FIBRINOGEN  LACTIC ACID, PLASMA  PROCALCITONIN  FERRITIN  C-REACTIVE PROTEIN    EKG None  Radiology DG Chest [facility] 1 View  Result  Date: 04/02/2020 CLINICAL DATA:  Hypoxia EXAM: PORTABLE CHEST 1 VIEW COMPARISON:  08/26/2019 FINDINGS: The heart size and mediastinal contours are within normal limits. Both lungs are clear. The visualized skeletal structures are unremarkable. IMPRESSION: No active disease. Electronically Signed   By:  Fidela Salisbury MD   On: 04/02/2020 18:10    Procedures Procedures (including critical care time)  Medications Ordered in ED Medications  acetaminophen (TYLENOL) tablet 650 mg (650 mg Oral Given 04/02/20 1729)    ED Course  I have reviewed the triage vital signs and the nursing notes.  Pertinent labs & imaging results that were available during my care of the patient were reviewed by me and considered in my medical decision making (see chart for details).  Clinical Course as of Apr 02 1842  Mon Apr 02, 8166  3066 64 year old female sent in from her facility for Covid positive increased oxygen requirement.  Patient herself denies any complaints.  Requiring nasal cannula oxygen.  Getting Covid labs chest x-ray EKG and will need admission to hospital for further management.   [MB]  9741 EKG shows normal sinus rhythm rate of 87 normal intervals nonspecific ST-T changes.   [MB]    Clinical Course User Index [MB] Hayden Rasmussen, MD   MDM Rules/Calculators/A&P                          Patient presenting for evaluation of low oxygen levels.  On exam, patient is nontoxic.  However she is requiring oxygen via nasal cannula to maintain sats.  Per chart review, patient was 83-84% on room air when she was at the infusion clinic earlier today.  No chest pain, as such low suspicion for.  Likely Covid causing hypoxia.  Will obtain labs, chest x-ray, and anticipate she will need to be admitted.  Labs interpreted by me, consistent with Covid.  Leukopenia at 3.  Inflammatory markers including lactic acid, and crp elevated.  Patient remains with stable O2 sat on 3 L.  cxr viewed and interpreted by me, no pna, pnx, effusion.  Due to hypoxia, will call for admission.  Discussed with Dr. Cyd Silence from [facility] hospital[facility] service, pt to be admitted.   Final Clinical Impression(s) / ED Diagnoses Final diagnoses:  COVID-19  Hypoxia    Rx / DC Orders ED Discharge Orders    None       Franchot Heidelberg, PA-C 04/02/20 1927    Hayden Rasmussen, MD 04/03/20 1046

## 2020-04-02 NOTE — Progress Notes (Signed)
Patient arrived to [facility] infusion clinic for medicaiton. Pt noted with sob upon exertion, VSS with exception of O2 sating between 82-86 on RA sustaining, RN discussed potential ER admission with patient, NP, and MD. Mendel Ryder, NP will assess  POC: O2 4L at 91% at this time. NP called pt's daughter and evaluate for ED admission.

## 2020-04-02 NOTE — Telephone Encounter (Signed)
Patient arrived in clinic with oxygen saturation of 84%.  She was placed on 3 liters of oxygen and came up to 92% with pursed lip breathing.  She was tachypneic with RR of 24-28.  With warming her hands with hot packs, her oxygenation maintained at 92% on 3 liters, but under 86% on room air.  Patient does not meet criteria for monoclonal antibody therapy.  I reviewed with the daughter that this treatment is not indicated for new onset oxygen requirement.  She will go to the ER for further evaluation.    PATIENT DOES NOT WANT TO BE INTUBATED.   She does accept other resuscitation measures, just not intubation.  Wilber Bihari, NP

## 2020-04-02 NOTE — ED Triage Notes (Signed)
Arrives from the infusion clinic, was getting MaB trt today, found to have oxygen in the 80s and they are unable to transfuse below 90. No complains at this time, denies SOB and CP.

## 2020-04-02 NOTE — ED Notes (Signed)
I attempted to insert an IV twice. I was unsuccessful in inserting a second IV or obtaining blood work.

## 2020-04-03 LAB — CBC WITH DIFFERENTIAL/PLATELET
Abs Immature Granulocytes: 0.02 10*3/uL (ref 0.00–0.07)
Basophils Absolute: 0 10*3/uL (ref 0.0–0.1)
Basophils Relative: 0 %
Eosinophils Absolute: 0 10*3/uL (ref 0.0–0.5)
Eosinophils Relative: 0 %
HCT: 37 % (ref 36.0–46.0)
Hemoglobin: 12.1 g/dL (ref 12.0–15.0)
Immature Granulocytes: 1 %
Lymphocytes Relative: 27 %
Lymphs Abs: 0.7 10*3/uL (ref 0.7–4.0)
MCH: 33.7 pg (ref 26.0–34.0)
MCHC: 32.7 g/dL (ref 30.0–36.0)
MCV: 103.1 fL — ABNORMAL HIGH (ref 80.0–100.0)
Monocytes Absolute: 0.2 10*3/uL (ref 0.1–1.0)
Monocytes Relative: 7 %
Neutro Abs: 1.6 10*3/uL — ABNORMAL LOW (ref 1.7–7.7)
Neutrophils Relative %: 65 %
Platelets: 127 10*3/uL — ABNORMAL LOW (ref 150–400)
RBC: 3.59 MIL/uL — ABNORMAL LOW (ref 3.87–5.11)
RDW: 12.4 % (ref 11.5–15.5)
WBC: 2.5 10*3/uL — ABNORMAL LOW (ref 4.0–10.5)
nRBC: 0 % (ref 0.0–0.2)

## 2020-04-03 LAB — COMPREHENSIVE METABOLIC PANEL
ALT: 17 U/L (ref 0–44)
AST: 25 U/L (ref 15–41)
Albumin: 3 g/dL — ABNORMAL LOW (ref 3.5–5.0)
Alkaline Phosphatase: 41 U/L (ref 38–126)
Anion gap: 9 (ref 5–15)
BUN: 7 mg/dL — ABNORMAL LOW (ref 8–23)
CO2: 26 mmol/L (ref 22–32)
Calcium: 8.1 mg/dL — ABNORMAL LOW (ref 8.9–10.3)
Chloride: 103 mmol/L (ref 98–111)
Creatinine, Ser: 0.57 mg/dL (ref 0.44–1.00)
GFR calc Af Amer: >60 mL/min (ref >60)
GFR calc non Af Amer: >60 mL/min (ref >60)
Glucose, Bld: 101 mg/dL — ABNORMAL HIGH (ref 70–99)
Potassium: 3.7 mmol/L (ref 3.5–5.1)
Sodium: 138 mmol/L (ref 135–145)
Total Bilirubin: 0.3 mg/dL (ref 0.3–1.2)
Total Protein: 6 g/dL — ABNORMAL LOW (ref 6.5–8.1)

## 2020-04-03 LAB — MAGNESIUM: Magnesium: 1.8 mg/dL (ref 1.7–2.4)

## 2020-04-03 LAB — PHOSPHORUS: Phosphorus: 3.4 mg/dL (ref 2.5–4.6)

## 2020-04-03 LAB — HIV ANTIBODY (ROUTINE TESTING W REFLEX): HIV Screen 4th Generation wRfx: NONREACTIVE

## 2020-04-03 LAB — C-REACTIVE PROTEIN: CRP: 8 mg/dL — ABNORMAL HIGH (ref <1.0)

## 2020-04-03 LAB — D-DIMER, QUANTITATIVE: D-Dimer, Quant: 0.6 ug/mL-FEU — ABNORMAL HIGH (ref 0.00–0.50)

## 2020-04-03 LAB — ABO/RH: ABO/RH(D): A POS

## 2020-04-03 NOTE — ED Notes (Signed)
[name],daughter, [phone] like an update on her mom. Mom didn't answer the phone.

## 2020-04-03 NOTE — Progress Notes (Signed)
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 Patient eventually presented to a [place] infusion clinic for monoclonal antibody infusion earlier in the day on 8/30 but upon arrival was noted to be short of breath with oxygen saturations of between 82 and 86%.  The patient was then sent to [facility] long emergency department for evaluation. ED Course: Afebrile, COVID-19 testing was confirmed to be positive.  Patient was found to be suffering from a lactic acidosis with lactate of 2.3.  Patient was also found to be hypoxic requiring 3 L of supplemental oxygen via nasal cannula Hospital course: Patient admitted to [facility] for further management  Subjective:  Patient denies any complaints. Still in ED awaiting bed availability. On 02 2.5 lits vis [place]  Objective: Vitals:   04/03/20 0535 04/03/20 0545 04/03/20 0624 04/03/20 0915  BP:   (!) 98/51 (!) 105/59  Pulse: 86 82 80 85  Resp:   (!) 23 (!) 30  Temp:      TempSrc:      SpO2: 91% 90% 98% 94%  Weight:      Height:       No intake or output data in the 24 hours ending 04/03/20 1053 Filed Weights   04/02/20 1705  Weight: 63 kg    Physical Examination:  General: Moderately built, no acute distress noted Head ENT: Atraumatic normocephalic, PERRLA, neck supple Heart: S1-S2 heard, regular rate and rhythm, no murmurs.  No leg edema noted Lungs: Equal air entry bilaterally, scattered wheezing, no accessory muscle use Abdomen: Bowel sounds heard, soft, nontender,  nondistended. No organomegaly.  No CVA tenderness Extremities: No pedal edema.  No cyanosis or clubbing. Neurological: Awake alert oriented x3, no focal weakness or numbness, strength and sensations to crude touch intact Skin: No wounds or rashes.  Data Reviewed: I have personally reviewed following labs and imaging studies  CBC: Recent Labs  Lab 04/02/20 1730 04/03/20 0506  WBC 3.0* 2.5*  NEUTROABS 2.1 1.6*  HGB 12.8 12.1  HCT 39.4 37.0  MCV 103.4* 103.1*  PLT 127* 814*   Basic Metabolic Panel: Recent Labs  Lab 04/02/20 1730 04/03/20 0506  NA 141 138  K 3.2* 3.7  CL 101 103  CO2 29 26  GLUCOSE 106* 101*  BUN 10 7*  CREATININE 0.71 0.57  CALCIUM 8.5* 8.1*  MG  --  1.8  PHOS  --  3.4   GFR: Estimated Creatinine Clearance: 70.9 mL/min (by C-G formula based on SCr of 0.57 mg/dL). Liver Function Tests: Recent Labs  Lab 04/02/20 1730 04/03/20 0506  AST 32 25  ALT 22 17  ALKPHOS 45 41  BILITOT 0.3 0.3  PROT 6.6 6.0*  ALBUMIN 3.4* 3.0*   No results for input(s): LIPASE, AMYLASE in the last 168 hours. No results for input(s): AMMONIA in the last 168 hours. Coagulation Profile: No results for input(s): INR, PROTIME in the last 168 hours. Cardiac Enzymes: No results for input(s): CKTOTAL, CKMB, CKMBINDEX, TROPONINI in the last 168 hours. BNP (last 3 results) No results for input(s): PROBNP in the last  8760 hours. HbA1C: No results for input(s): HGBA1C in the last 72 hours. CBG: No results for input(s): GLUCAP in the last 168 hours. Lipid Profile: Recent Labs    04/02/20 1730  TRIG 80   Thyroid Function Tests: No results for input(s): TSH, T4TOTAL, FREET4, T3FREE, THYROIDAB in the last 72 hours. Anemia Panel: Recent Labs    04/02/20 1730  FERRITIN 279   Sepsis Labs: Recent Labs  Lab 04/02/20 1730 04/02/20 1915  PROCALCITON <0.10  --   LATICACIDVEN 2.3* 0.8    Recent Results (from the past 240 hour(s))  Blood Culture (routine x 2)     Status:  None (Preliminary result)   Collection Time: 04/02/20  5:30 PM   Specimen: BLOOD RIGHT HAND  Result Value Ref Range Status   Specimen Description   Final    BLOOD RIGHT HAND Performed at [facility] [address] [place], [place] [address]    Special Requests   Final    BOTTLES DRAWN AEROBIC ONLY Blood Culture results may not be optimal due to an inadequate volume of blood received in culture bottles Performed at [facility] [address] [place], [place] [address]    Culture   Final    NO GROWTH < 12 HOURS Performed at [facility] [address] [place], [place] [address]    Report Status PENDING  Incomplete  SARS Coronavirus 2 by RT PCR (hospital order, performed in [facility] hospital lab) Nasopharyngeal Nasopharyngeal Swab     Status: Abnormal   Collection Time: 04/02/20  5:43 PM   Specimen: Nasopharyngeal Swab  Result Value Ref Range Status   SARS Coronavirus 2 POSITIVE (A) NEGATIVE Final    Comment: RESULT CALLED TO, READ BACK BY AND VERIFIED WITH: TALKINGTON,J. RN @2023  8/30/21BILLINGSLEY,L (NOTE) SARS-CoV-2 target nucleic acids are DETECTED  SARS-CoV-2 RNA is generally detectable in upper respiratory specimens  during the acute phase of infection.  Positive results are indicative  of the presence of the identified virus, but do not rule out bacterial infection or co-infection with other pathogens not detected by the test.  Clinical correlation with patient history and  other diagnostic information is necessary to determine patient infection status.  The expected result is negative.  Fact Sheet for Patients:   StrictlyIdeas.no   Fact Sheet for Healthcare Providers:   BankingDealers.co.za    This test is not yet approved or cleared by the [place] FDA and  has been authorized for detection and/or diagnosis of SARS-CoV-2 by FDA under an Emergency Use Authorization  (EUA).  This EUA will remain in effect (meanin g this test can be used) for the duration of  the COVID-19 declaration under Section 564(b)(1) of the Act, 21 U.S.C. section 360-bbb-3(b)(1), unless the authorization is terminated or revoked sooner.  Performed at [facility], [place] [address], [place],  [address]       Radiology Studies: DG Chest Port 1 View  Result Date: 04/02/2020 CLINICAL DATA:  Hypoxia EXAM: PORTABLE CHEST 1 VIEW COMPARISON:  08/26/2019 FINDINGS: The heart size and mediastinal contours are within normal limits. Both lungs are clear. The visualized skeletal structures are unremarkable. IMPRESSION: No active disease. Electronically Signed   By: Fidela Salisbury MD   On: 04/02/2020 18:10      Scheduled Meds: . albuterol  2 puff Inhalation Q6H  . vitamin C  500 mg Oral Daily  . botulinum toxin Type A  200 Units Intramuscular Once  .  Cenobamate  100 mg Oral Daily  . enoxaparin (LOVENOX) injection  40 mg Subcutaneous Q24H  . escitalopram  10 mg Oral Daily  . gabapentin  300 mg Oral QHS  . lacosamide  200 mg Oral BID  . LORazepam  1 mg Oral QHS  . methylPREDNISolone (SOLU-MEDROL) injection  40 mg Intravenous Q12H  . phenytoin  100 mg Oral QHS  . phenytoin  130 mg Oral Daily  . zinc sulfate  220 mg Oral Daily   Continuous Infusions: . lactated ringers 125 mL/hr at 04/02/20 2331  . remdesivir 100 mg in NS 100 mL        Assessment/Plan:  1. Acute hypoxic respiratory failure : POA, currently requiring 2-3 L via nasal cannula.   2. COVID 19 illness with lactic acidosis: Admitted with intravenous remdesivir and Solu-Medro with plan to consider initiation of Actemra or Baricitinib if worsens. Procalcitonin is normal and therefore antibacterials not initiated.Providing patient with vitamin C and zinc. Lactate 2.3->0.8  3. COPD exacerbation: in the setting of above. Providing patient with scheduled bronchodilator therapy via MDI in addition to  IV steroids  4. Seizure disorder -Complicated history of seizure disorder secondary to remote history of right parieto-occipital AVM rupture status post craniotomy 1998. Patient is on extensive antiepileptic regimen which will be continued.  5.  Hypokalemia, resolved with oral potassium chloride replacement. Monitoring potassium levels with serial chemistries.   DVT prophylaxis: lovenox Code Status: Full code Family / Patient Communication: d/w patient  Disposition Plan:   Status is: Inpatient  Remains inpatient appropriate because:Ongoing diagnostic testing needed not appropriate for outpatient work up   Dispo: The patient is from: Home              Anticipated d/c is to: Home              Anticipated d/c date is: 3 days              Patient currently is not medically stable to d/c.    Time spent: 25 minutes     >50% time spent in discussions with care team and coordination of care.    [place] Shi, MD [facility] Pager in [place]  If 7PM-7AM, please contact night-coverage [email or web address] 04/03/2020, 10:53 AM

## 2020-04-03 NOTE — Evaluation (Signed)
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  Clinical Impression   the Patient mobilized to [facility] and back with min guard/supervision. Patient I=on  2 L [place] with SPO2 93 down to 88% with activity. Patient should progress to return home.Pt admitted with above diagnosis.   Pt currently with functional limitations due to the deficits listed below (see PT Problem List). Pt will benefit from skilled PT to increase their independence and safety with mobility to allow discharge to the venue listed below.       Follow Up Recommendations Home health PT    Equipment Recommendations  None recommended by PT    Recommendations for Other Services       Precautions / Restrictions Precautions Precautions: Fall Precaution Comments: seizures      Mobility  Bed Mobility Overal bed mobility: Needs Assistance Bed Mobility: Supine to Sit;Sit to Supine     Supine to sit: Supervision Sit to supine: Supervision   General bed mobility comments: no assistance  Transfers Overall transfer level: Needs assistance   Transfers: Sit to/from Stand;Stand Pivot Transfers Sit to Stand: Min guard Stand pivot transfers: Min guard       General transfer comment: from stretcher  to [facility] and back  Ambulation/Gait             General Gait Details: TBA  Stairs            Wheelchair Mobility    Modified Rankin (Stroke Patients Only)       Balance Overall balance assessment: Mild deficits observed, not formally tested                                           Pertinent Vitals/Pain Pain Assessment: No/denies pain     Home Living Family/patient expects to be discharged to:: Private residence Living Arrangements: Children;Other relatives Available Help at Discharge: Family;Available 24 hours/day Type of Home: House Home Access: Level entry     Home Layout: Two level Home Equipment: Cane - single point Additional Comments: Pt reports she lives with her daughter and her daughter's family    Prior Function Level of Independence: Independent         Comments: does not drive     Hand Dominance   Dominant Hand: Right    Extremity/Trunk Assessment   Upper Extremity Assessment Upper Extremity Assessment: Defer to OT evaluation    Lower Extremity Assessment Lower Extremity Assessment: Generalized weakness    Cervical / Trunk Assessment Cervical / Trunk Assessment: Normal  Communication   Communication: No difficulties  Cognition Arousal/Alertness: Awake/alert Behavior During Therapy: Flat affect;WFL for tasks assessed/performed Overall Cognitive Status: Within Functional Limits for tasks assessed                                        General Comments      Exercises     Assessment/Plan    PT Assessment Patient needs continued PT services  PT Problem List Decreased strength;Decreased mobility;Decreased activity tolerance;Decreased  knowledge of use of DME       PT Treatment Interventions DME instruction;Therapeutic exercise;Gait training;Functional mobility training;Therapeutic activities;Patient/family education    PT Goals (Current goals can be found in the Care Plan section)  Acute Rehab PT Goals Patient Stated Goal: to go home PT Goal Formulation: With patient Time For Goal Achievement: 04/17/20 Potential to Achieve Goals: Good    Frequency Min 3X/week   Barriers to discharge        Co-evaluation               AM-PAC PT "6 Clicks" Mobility  Outcome Measure Help needed turning from your back to your side while in a flat bed without  using bedrails?: None Help needed moving from lying on your back to sitting on the side of a flat bed without using bedrails?: None Help needed moving to and from a bed to a chair (including a wheelchair)?: A Little Help needed standing up from a chair using your arms (e.g., wheelchair or bedside chair)?: A Little Help needed to walk in hospital room?: A Little Help needed climbing 3-5 steps with a railing? : A Lot 6 Click Score: 19    End of Session   Activity Tolerance: Patient limited by fatigue Patient left: in bed;with call bell/phone within reach Nurse Communication: Mobility status PT Visit Diagnosis: Unsteadiness on feet (R26.81);Other symptoms and signs involving the nervous system (R29.898)    Time: 4481-8563 PT Time Calculation (min) (ACUTE ONLY): 29 min   Charges:   PT Evaluation $PT Eval Low Complexity: 1 Low PT Treatments $Therapeutic Activity: 8-22 mins        Tresa Endo PT Acute Rehabilitation Services Pager [phone] Office [phone]   Claretha Cooper 04/03/2020, 3:39 PM

## 2020-04-03 NOTE — ED Notes (Signed)
Mopped floor in rm 15, cleaned pt up, peri-care and lien and full lien change

## 2020-04-03 NOTE — Progress Notes (Signed)
Patient arrives to room 1523 via stretcher from the ED at this time.  Patient oriented to callbell use with stated understanding

## 2020-04-03 NOTE — ED Notes (Signed)
ED TO INPATIENT HANDOFF REPORT  Name/Age/Gender [name] 64 y.o. female  Code Status    Code Status Orders  (From admission, onward)         Start     Ordered   04/02/20 2109  Full code  Continuous        04/02/20 2108        Code Status History    Date Active Date Inactive Code Status Order ID Comments User Context   08/26/2019 1105 08/29/2019 1726 Full Code [id number]  Norval Morton, MD ED   07/16/2018 1849 07/19/2018 2204 Full Code [id number]  Thurnell Lose, MD Inpatient   05/29/2018 2349 06/01/2018 1343 Full Code [id number]  Vianne Bulls, MD ED   05/23/2018 2105 05/26/2018 1904 Full Code [id number]  Ivor Costa, MD ED   03/19/2018 2305 03/25/2018 1735 Full Code [id number]  Rise Patience, MD Inpatient   02/17/2017 0822 02/18/2017 2042 Full Code [id number]  Waldemar Dickens, MD ED   02/17/2017 0822 02/17/2017 0822 Full Code [id number]  Waldemar Dickens, MD ED   11/02/2015 0245 11/04/2015 1840 Full Code [id number]  Norval Morton, MD ED   03/31/2015 0032 04/01/2015 1615 Full Code [id number]  Ivor Costa, MD Inpatient   01/18/2015 0246 01/19/2015 1732 Full Code [id number]  Ivor Costa, MD Inpatient   01/10/2015 0024 01/11/2015 1558 Full Code [id number]  Allyne Gee, MD ED   11/09/2014 2100 11/10/2014 2019 Full Code [id number]  Deneise Lever, MD ED   05/15/2014 0200 05/15/2014 1544 Full Code [id number]  Ivor Costa, MD Inpatient   06/17/2013 1417 06/20/2013 1843 Full Code [id number]  Orson Eva, MD Inpatient   06/11/2013 2007 06/13/2013 1858 Full Code [id number]  Jonetta Osgood, MD ED   04/15/2013 0040 04/15/2013 1332 Full Code [id number]  Caren Griffins, MD ED   01/23/2013 0030 01/23/2013 1842 Full Code [id number]  Theressa Millard, MD ED   12/13/2012 0727 12/19/2012 1904 Full Code [id number]  Marzetta Board, MD ED   02/28/2012 0210 03/05/2012 1958 Full Code [id number]  Almyra Free, RN Inpatient   Advance Care Planning Activity      Home/SNF/Other Home  Chief Complaint COVID-19 virus  infection [U07.1]  Level of Care/Admitting Diagnosis ED Disposition    ED Disposition Condition [facility] [[id number]]  Level of Care: Telemetry [5]  Admit to tele based on following criteria: Other see comments  Comments: covid  May admit patient to [facility] or [facility] if equivalent level of care is available:: No  Covid Evaluation: Confirmed COVID Positive  Diagnosis: COVID-19 virus infection [[id number]]  Admitting Physician: Vernelle Emerald [[id number]]  Attending Physician: Vernelle Emerald [[id number]]  Estimated length of stay: 3 - 4 days  Certification:: I certify this patient will need inpatient services for at least 2 midnights       Medical History Past Medical History:  Diagnosis Date  . Asthma   . Cerebral hemorrhage ([place]) 1989  . Chronic back pain   . COPD (chronic obstructive pulmonary disease) ([place])   . DDD (degenerative disc disease) 06/12/2013  . DVT (deep venous thrombosis) ([place])    "she's had several since 1990"  . GERD (gastroesophageal reflux disease)   . Headache    "usually around time when she's had a seizure"  . History of blood transfusion    "when she was a baby"  . History of stomach ulcers   .  Kidney stones   . Neuropathy   . Seizures ([place])    "because of her brain surgery"  . Stroke [facility]) [phone]   family denies residual on 11/09/2014  . Tunnel vision    "since brain OR"    Allergies Allergies  Allergen Reactions  . Zonisamide Other (See Comments)    Numbness and tingling over whole body  . Chocolate Flavor Other (See Comments)    Trigger for Seizure  . Codeine Nausea And Vomiting  . Keppra [Levetiracetam] Other (See Comments)    Causes seizures  . Pregabalin Nausea And Vomiting  . Olive Oil Rash    IV Location/Drains/Wounds Patient Lines/Drains/Airways Status    Active Line/Drains/Airways    Name Placement date Placement time Site Days   Peripheral IV 04/02/20  Left;Posterior Hand 04/02/20  1536  Hand  1   Peripheral IV 04/02/20 Left Antecubital 04/02/20  2330  Antecubital  1          Labs/Imaging Results for orders placed or performed during the hospital encounter of 04/02/20 (from the past 48 hour(s))  Lactic acid, plasma     Status: Abnormal   Collection Time: 04/02/20  5:30 PM  Result Value Ref Range   Lactic Acid, Venous 2.3 (HH) 0.5 - 1.9 mmol/L    Comment: CRITICAL RESULT CALLED TO, READ BACK BY AND VERIFIED WITH: FARRAR,S. RN @1835  04/02/20 BILLINGSLEY,L Performed at [facility], [place] [address] [place], [place] [address]   Blood Culture (routine x 2)     Status: None (Preliminary result)   Collection Time: 04/02/20  5:30 PM   Specimen: BLOOD RIGHT HAND  Result Value Ref Range   Specimen Description      BLOOD RIGHT HAND Performed at [place] [address], [place], [place] [address]    Special Requests      BOTTLES DRAWN AEROBIC ONLY Blood Culture results may not be optimal due to an inadequate volume of blood received in culture bottles Performed at [facility] [address] [place], [place] [address]    Culture      NO GROWTH < 12 HOURS Performed at [facility] [address], [place], [place] [address]    Report Status PENDING   CBC WITH DIFFERENTIAL     Status: Abnormal   Collection Time: 04/02/20  5:30 PM  Result Value Ref Range   WBC 3.0 (L) 4.0 - 10.5 K/uL   RBC 3.81 (L) 3.87 - 5.11 MIL/uL   Hemoglobin 12.8 12.0 - 15.0 g/dL   HCT 39.4 36 - 46 %   MCV 103.4 (H) 80.0 - 100.0 fL   MCH 33.6 26.0 - 34.0 pg   MCHC 32.5 30.0 - 36.0 g/dL   RDW 12.7 11.5 - 15.5 %   Platelets 127 (L) 150 - 400 K/uL   nRBC 0.0 0.0 - 0.2 %   Neutrophils Relative % 67 %   Neutro Abs 2.1 1.7 - 7.7 K/uL   Lymphocytes Relative 25 %   Lymphs Abs 0.7 0.7 - 4.0 K/uL   Monocytes Relative 7 %   Monocytes Absolute 0.2 0 - 1 K/uL   Eosinophils Relative 0 %   Eosinophils  Absolute 0.0 0 - 0 K/uL   Basophils Relative 0 %   Basophils Absolute 0.0 0 - 0 K/uL   Immature Granulocytes 1 %   Abs Immature Granulocytes 0.02 0.00 - 0.07 K/uL    Comment: Performed at [facility], [address] [place], [place] [address]  Comprehensive metabolic panel     Status: Abnormal   Collection Time: 04/02/20  5:30 PM  Result Value Ref Range   Sodium 141 135 - 145 mmol/L   Potassium 3.2 (L) 3.5 - 5.1 mmol/L   Chloride 101 98 - 111 mmol/L   CO2 29 22 - 32 mmol/L   Glucose, Bld 106 (H) 70 - 99 mg/dL    Comment: Glucose reference range applies only to samples taken after fasting for at least 8 hours.   BUN 10 8 - 23 mg/dL   Creatinine, Ser 0.71 0.44 - 1.00 mg/dL   Calcium 8.5 (L) 8.9 - 10.3 mg/dL   Total Protein 6.6 6.5 - 8.1 g/dL   Albumin 3.4 (L) 3.5 - 5.0 g/dL   AST 32 15 - 41 U/L   ALT 22 0 - 44 U/L   Alkaline Phosphatase 45 38 - 126 U/L   Total Bilirubin 0.3 0.3 - 1.2 mg/dL   GFR calc non Af Amer >60 >60 mL/min   GFR calc Af Amer >60 >60 mL/min   Anion gap 11 5 - 15    Comment: Performed at [facility], [place] [address], [place], [place] [address]  D-dimer, quantitative     Status: Abnormal   Collection Time: 04/02/20  5:30 PM  Result Value Ref Range   D-Dimer, Quant 0.63 (H) 0.00 - 0.50 ug/mL-FEU    Comment: (NOTE) At the manufacturer cut-off of 0.50 ug/mL FEU, this assay has been documented to exclude PE with a sensitivity and negative predictive value of 97 to 99%.  At this time, this assay has not been approved by the FDA to exclude DVT/VTE. Results should be correlated with clinical presentation. Performed at [facility], [place] [address] [place], [place] [address]   Procalcitonin     Status: None   Collection Time: 04/02/20  5:30 PM  Result Value Ref Range   Procalcitonin <0.10 ng/mL    Comment:        Interpretation: PCT (Procalcitonin) <= 0.5 ng/mL: Systemic infection (sepsis) is not  likely. Local bacterial infection is possible. (NOTE)       Sepsis PCT Algorithm           Lower Respiratory Tract                                      Infection PCT Algorithm    ----------------------------     ----------------------------         PCT < 0.25 ng/mL                PCT < 0.10 ng/mL          Strongly encourage             Strongly discourage   discontinuation of antibiotics    initiation of antibiotics    ----------------------------     -----------------------------       PCT 0.25 - 0.50 ng/mL            PCT 0.10 - 0.25 ng/mL               OR       >80% decrease in PCT            Discourage initiation of  antibiotics      Encourage discontinuation           of antibiotics    ----------------------------     -----------------------------         PCT >= 0.50 ng/mL              PCT 0.26 - 0.50 ng/mL               AND        <80% decrease in PCT             Encourage initiation of                                             antibiotics       Encourage continuation           of antibiotics    ----------------------------     -----------------------------        PCT >= 0.50 ng/mL                  PCT > 0.50 ng/mL               AND         increase in PCT                  Strongly encourage                                      initiation of antibiotics    Strongly encourage escalation           of antibiotics                                     -----------------------------                                           PCT <= 0.25 ng/mL                                                 OR                                        > 80% decrease in PCT                                      Discontinue / Do not initiate                                             antibiotics  Performed at [place] [address], [place], [place] [address]   Lactate dehydrogenase     Status: None   Collection Time:  04/02/20  5:30  PM  Result Value Ref Range   LDH 184 98 - 192 U/L    Comment: Performed at [facility], [place] [address] [place], [place] [address]  Ferritin     Status: None   Collection Time: 04/02/20  5:30 PM  Result Value Ref Range   Ferritin 279 11 - 307 ng/mL    Comment: Performed at [facility], [place] [address], [place], [place] [address]  Triglycerides     Status: None   Collection Time: 04/02/20  5:30 PM  Result Value Ref Range   Triglycerides 80 <150 mg/dL    Comment: Performed at [facility], [place] [address] [place], [place] [address]  Fibrinogen     Status: None   Collection Time: 04/02/20  5:30 PM  Result Value Ref Range   Fibrinogen 471 210 - 475 mg/dL    Comment: Performed at [facility], [place] [address] [place], [place] [address]  C-reactive protein     Status: Abnormal   Collection Time: 04/02/20  5:30 PM  Result Value Ref Range   CRP 5.9 (H) <1.0 mg/dL    Comment: Performed at [facility], [place] [address], [place], [place] [address]  SARS Coronavirus 2 by RT PCR (hospital order, performed in [facility] hospital lab) Nasopharyngeal Nasopharyngeal Swab     Status: Abnormal   Collection Time: 04/02/20  5:43 PM   Specimen: Nasopharyngeal Swab  Result Value Ref Range   SARS Coronavirus 2 POSITIVE (A) NEGATIVE    Comment: RESULT CALLED TO, READ BACK BY AND VERIFIED WITH: TALKINGTON,J. RN @2023  8/30/21BILLINGSLEY,L (NOTE) SARS-CoV-2 target nucleic acids are DETECTED  SARS-CoV-2 RNA is generally detectable in upper respiratory specimens  during the acute phase of infection.  Positive results are indicative  of the presence of the identified virus, but do not rule out bacterial infection or co-infection with other pathogens not detected by the test.  Clinical correlation with patient history and  other diagnostic information is necessary to determine patient infection status.  The  expected result is negative.  Fact Sheet for Patients:   StrictlyIdeas.no   Fact Sheet for Healthcare Providers:   BankingDealers.co.za    This test is not yet approved or cleared by the [place] FDA and  has been authorized for detection and/or diagnosis of SARS-CoV-2 by FDA under an Emergency Use Authorization (EUA).  This EUA will remain in effect (meanin g this test can be used) for the duration of  the COVID-19 declaration under Section 564(b)(1) of the Act, 21 U.S.C. section 360-bbb-3(b)(1), unless the authorization is terminated or revoked sooner.  Performed at [facility], [place] [address] [place], [place] [address]   Lactic acid, plasma     Status: None   Collection Time: 04/02/20  7:15 PM  Result Value Ref Range   Lactic Acid, Venous 0.8 0.5 - 1.9 mmol/L    Comment: Performed at [facility], [place] [address], [place], [place] [address]  HIV Antibody (routine testing w rflx)     Status: None   Collection Time: 04/02/20 11:32 PM  Result Value Ref Range   HIV Screen 4th Generation wRfx Non Reactive Non Reactive    Comment: Performed at [facility], [place] [address], [place], [place] [address]  ABO/Rh     Status: None   Collection Time: 04/02/20 11:32 PM  Result Value Ref Range   ABO/RH(D)  A POS Performed at [facility], [place] [address] [place], [place] [address]   CBC with Differential/Platelet     Status: Abnormal   Collection Time: 04/03/20  5:06 AM  Result Value Ref Range   WBC 2.5 (L) 4.0 - 10.5 K/uL   RBC 3.59 (L) 3.87 - 5.11 MIL/uL   Hemoglobin 12.1 12.0 - 15.0 g/dL   HCT 37.0 36 - 46 %   MCV 103.1 (H) 80.0 - 100.0 fL   MCH 33.7 26.0 - 34.0 pg   MCHC 32.7 30.0 - 36.0 g/dL   RDW 12.4 11.5 - 15.5 %   Platelets 127 (L) 150 - 400 K/uL    Comment: Immature Platelet Fraction may be clinically indicated, consider ordering this additional  test [id number]    nRBC 0.0 0.0 - 0.2 %   Neutrophils Relative % 65 %   Neutro Abs 1.6 (L) 1.7 - 7.7 K/uL   Lymphocytes Relative 27 %   Lymphs Abs 0.7 0.7 - 4.0 K/uL   Monocytes Relative 7 %   Monocytes Absolute 0.2 0 - 1 K/uL   Eosinophils Relative 0 %   Eosinophils Absolute 0.0 0 - 0 K/uL   Basophils Relative 0 %   Basophils Absolute 0.0 0 - 0 K/uL   Immature Granulocytes 1 %   Abs Immature Granulocytes 0.02 0.00 - 0.07 K/uL    Comment: Performed at [facility], [place] [address], [place], [place] [address]  Comprehensive metabolic panel     Status: Abnormal   Collection Time: 04/03/20  5:06 AM  Result Value Ref Range   Sodium 138 135 - 145 mmol/L   Potassium 3.7 3.5 - 5.1 mmol/L   Chloride 103 98 - 111 mmol/L   CO2 26 22 - 32 mmol/L   Glucose, Bld 101 (H) 70 - 99 mg/dL    Comment: Glucose reference range applies only to samples taken after fasting for at least 8 hours.   BUN 7 (L) 8 - 23 mg/dL   Creatinine, Ser 0.57 0.44 - 1.00 mg/dL   Calcium 8.1 (L) 8.9 - 10.3 mg/dL   Total Protein 6.0 (L) 6.5 - 8.1 g/dL   Albumin 3.0 (L) 3.5 - 5.0 g/dL   AST 25 15 - 41 U/L   ALT 17 0 - 44 U/L   Alkaline Phosphatase 41 38 - 126 U/L   Total Bilirubin 0.3 0.3 - 1.2 mg/dL   GFR calc non Af Amer >60 >60 mL/min   GFR calc Af Amer >60 >60 mL/min   Anion gap 9 5 - 15    Comment: Performed at [facility], [place] [address], [place], [place] [address]  C-reactive protein     Status: Abnormal   Collection Time: 04/03/20  5:06 AM  Result Value Ref Range   CRP 8.0 (H) <1.0 mg/dL    Comment: Performed at [facility], [place] [address] [place], [place] [address]  D-dimer, quantitative (not at [facility])     Status: Abnormal   Collection Time: 04/03/20  5:06 AM  Result Value Ref Range   D-Dimer, Quant 0.60 (H) 0.00 - 0.50 ug/mL-FEU    Comment: (NOTE) At the manufacturer cut-off of 0.50 ug/mL FEU, this assay has been documented to exclude PE with a  sensitivity and negative predictive value of 97 to 99%.  At this time, this assay has not been approved by the FDA to exclude DVT/VTE. Results should be correlated with clinical presentation. Performed at [facility]  [place] [address], [place], [place] [address]   Magnesium     Status: None   Collection Time: 04/03/20  5:06 AM  Result Value Ref Range   Magnesium 1.8 1.7 - 2.4 mg/dL    Comment: Performed at [facility], [place] [address] [place], [place] [address]  Phosphorus     Status: None   Collection Time: 04/03/20  5:06 AM  Result Value Ref Range   Phosphorus 3.4 2.5 - 4.6 mg/dL    Comment: Performed at [facility], [place] [address] [place], [place] [address]   DG Chest Port 1 View  Result Date: 04/02/2020 CLINICAL DATA:  Hypoxia EXAM: PORTABLE CHEST 1 VIEW COMPARISON:  08/26/2019 FINDINGS: The heart size and mediastinal contours are within normal limits. Both lungs are clear. The visualized skeletal structures are unremarkable. IMPRESSION: No active disease. Electronically Signed   By: Fidela Salisbury MD   On: 04/02/2020 18:10    Pending Labs Unresulted Labs (From admission, onward)          Start     Ordered   04/03/20 0500  CBC with Differential/Platelet  Daily,   R      04/02/20 2108   04/03/20 0500  Comprehensive metabolic panel  Daily,   R      04/02/20 2108   04/03/20 0500  C-reactive protein  Daily,   R      04/02/20 2108   04/03/20 0500  D-dimer, quantitative (not at [facility])  Daily,   R      04/02/20 2108   04/03/20 0500  Magnesium  Daily,   R      04/02/20 2108   04/03/20 0500  Phosphorus  Daily,   R      04/02/20 2108   04/02/20 1656  Blood Culture (routine x 2)  BLOOD CULTURE X 2,   STAT      04/02/20 1656          Vitals/Pain Today's Vitals   04/03/20 1630 04/03/20 1700 04/03/20 1710 04/03/20 1730  BP: 105/63 111/66 111/66 112/61  Pulse: 88 76 75 80  Resp: 18   18  Temp:   99.6 F (37.6 C)    TempSrc:   Oral   SpO2: 96% 96% 95% 92%  Weight:      Height:      PainSc:        Isolation Precautions Airborne and Contact precautions  Medications Medications  escitalopram (LEXAPRO) tablet 10 mg (10 mg Oral Given 04/03/20 1136)  LORazepam (ATIVAN) tablet 1 mg (1 mg Oral Given 04/02/20 2222)  Cenobamate TABS 100 mg (100 mg Oral Not Given 04/03/20 1141)  gabapentin (NEURONTIN) capsule 300 mg (300 mg Oral Given 04/02/20 2222)  lacosamide (VIMPAT) tablet 200 mg (200 mg Oral Given 04/03/20 1135)  phenytoin (DILANTIN) ER capsule 130 mg (130 mg Oral Given 04/03/20 1747)  phenytoin (DILANTIN) ER capsule 100 mg (100 mg Oral Given 04/02/20 2221)  enoxaparin (LOVENOX) injection 40 mg (40 mg Subcutaneous Given 04/02/20 2226)  lactated ringers bolus 1,000 mL (0 mLs Intravenous Stopped 04/03/20 0518)    Followed by  lactated ringers infusion ( Intravenous Stopped 04/03/20 1743)  acetaminophen (TYLENOL) tablet 650 mg (has no administration in time range)  polyethylene glycol (MIRALAX / GLYCOLAX) packet 17 g (has no administration in time range)  ondansetron (ZOFRAN) tablet 4 mg (has no administration in time range)    Or  ondansetron (ZOFRAN) injection 4 mg (has no administration  in time range)  remdesivir 200 mg in sodium chloride 0.9% 250 mL IVPB (0 mg Intravenous Stopped 04/02/20 2314)    Followed by  remdesivir 100 mg in sodium chloride 0.9 % 100 mL IVPB (0 mg Intravenous Stopped 04/03/20 1744)  albuterol (VENTOLIN HFA) 108 (90 Base) MCG/ACT inhaler 2 puff (2 puffs Inhalation Given 04/03/20 1407)  methylPREDNISolone sodium succinate (SOLU-MEDROL) 40 mg/mL injection 40 mg (40 mg Intravenous Given 04/03/20 1140)  guaiFENesin-dextromethorphan (ROBITUSSIN DM) 100-10 MG/5ML syrup 10 mL (has no administration in time range)  ascorbic acid (VITAMIN C) tablet 500 mg (500 mg Oral Given 04/03/20 1135)  zinc sulfate capsule 220 mg (220 mg Oral Given 04/03/20 1135)  acetaminophen (TYLENOL) tablet 650 mg (650 mg  Oral Given 04/02/20 1729)  potassium chloride (KLOR-CON) packet 40 mEq (40 mEq Oral Given 04/02/20 2221)    Mobility walks

## 2020-04-04 LAB — COMPREHENSIVE METABOLIC PANEL
ALT: 16 U/L (ref 0–44)
AST: 21 U/L (ref 15–41)
Albumin: 2.9 g/dL — ABNORMAL LOW (ref 3.5–5.0)
Alkaline Phosphatase: 35 U/L — ABNORMAL LOW (ref 38–126)
Anion gap: 10 (ref 5–15)
BUN: 10 mg/dL (ref 8–23)
CO2: 25 mmol/L (ref 22–32)
Calcium: 8.3 mg/dL — ABNORMAL LOW (ref 8.9–10.3)
Chloride: 103 mmol/L (ref 98–111)
Creatinine, Ser: 0.51 mg/dL (ref 0.44–1.00)
GFR calc Af Amer: >60 mL/min (ref >60)
GFR calc non Af Amer: >60 mL/min (ref >60)
Glucose, Bld: 159 mg/dL — ABNORMAL HIGH (ref 70–99)
Potassium: 3.6 mmol/L (ref 3.5–5.1)
Sodium: 138 mmol/L (ref 135–145)
Total Bilirubin: 0.4 mg/dL (ref 0.3–1.2)
Total Protein: 5.9 g/dL — ABNORMAL LOW (ref 6.5–8.1)

## 2020-04-04 LAB — CBC WITH DIFFERENTIAL/PLATELET
Abs Immature Granulocytes: 0.03 10*3/uL (ref 0.00–0.07)
Basophils Absolute: 0 10*3/uL (ref 0.0–0.1)
Basophils Relative: 0 %
Eosinophils Absolute: 0 10*3/uL (ref 0.0–0.5)
Eosinophils Relative: 0 %
HCT: 37.2 % (ref 36.0–46.0)
Hemoglobin: 12.5 g/dL (ref 12.0–15.0)
Immature Granulocytes: 2 %
Lymphocytes Relative: 32 %
Lymphs Abs: 0.6 10*3/uL — ABNORMAL LOW (ref 0.7–4.0)
MCH: 35.2 pg — ABNORMAL HIGH (ref 26.0–34.0)
MCHC: 33.6 g/dL (ref 30.0–36.0)
MCV: 104.8 fL — ABNORMAL HIGH (ref 80.0–100.0)
Monocytes Absolute: 0.2 10*3/uL (ref 0.1–1.0)
Monocytes Relative: 8 %
Neutro Abs: 1.1 10*3/uL — ABNORMAL LOW (ref 1.7–7.7)
Neutrophils Relative %: 58 %
Platelets: 143 10*3/uL — ABNORMAL LOW (ref 150–400)
RBC: 3.55 MIL/uL — ABNORMAL LOW (ref 3.87–5.11)
RDW: 12.6 % (ref 11.5–15.5)
WBC: 1.9 10*3/uL — ABNORMAL LOW (ref 4.0–10.5)
nRBC: 0 % (ref 0.0–0.2)

## 2020-04-04 LAB — D-DIMER, QUANTITATIVE: D-Dimer, Quant: 0.6 ug/mL-FEU — ABNORMAL HIGH (ref 0.00–0.50)

## 2020-04-04 LAB — GLUCOSE, CAPILLARY
Glucose-Capillary: 92 mg/dL (ref 70–99)
Glucose-Capillary: 133 mg/dL — ABNORMAL HIGH (ref 70–99)

## 2020-04-04 LAB — C-REACTIVE PROTEIN: CRP: 7.1 mg/dL — ABNORMAL HIGH (ref <1.0)

## 2020-04-04 LAB — PHOSPHORUS: Phosphorus: 3.3 mg/dL (ref 2.5–4.6)

## 2020-04-04 LAB — MAGNESIUM: Magnesium: 2.2 mg/dL (ref 1.7–2.4)

## 2020-04-04 MED ORDER — ENSURE ENLIVE PO LIQD
237.0000 mL | Freq: Two times a day (BID) | ORAL | Status: DC
  Administered 2020-04-04 – 2020-04-07 (×6): 237 mL via ORAL

## 2020-04-04 MED ORDER — ADULT MULTIVITAMIN W/MINERALS CH
1.0000 | ORAL_TABLET | Freq: Every day | ORAL | Status: DC
  Administered 2020-04-04 – 2020-04-07 (×4): 1 via ORAL
  Filled 2020-04-04 (×4): qty 1

## 2020-04-04 MED ORDER — PHENYTOIN SODIUM EXTENDED 30 MG PO CAPS
130.0000 mg | ORAL_CAPSULE | Freq: Every day | ORAL | Status: DC
  Administered 2020-04-05 – 2020-04-07 (×3): 130 mg via ORAL
  Filled 2020-04-04 (×3): qty 1

## 2020-04-04 NOTE — Evaluation (Signed)
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   Clinical Impression   [name] is a 64 year old woman admitted to hospital with [place]. On evaluation patient presents with normal ROM, strength and coordination of upper extremities, on 4 liters of oxygen with generalized weakness and decreased activity tolerance resulting in a decline in independence. Exhibits a mild tremor in head. Patient demonstrates ability to perform supine to sit with supervision, standing with min guard and one mild loss of balance and min guard to ambulate around the bed to recliner. When therapist entered room patient 94% on 4 liters [place] and HR 101. With ambulation patient's o2 sat dropped to 87% and improved to 90% in seated position. Patient will benefit from skilled OT services while in hospital in order to maintain functional abilities and improve deficits in order to return home at discharge. Do not foresee patient needed OT services at discharge.    Follow Up Recommendations  No OT follow up    Equipment Recommendations  Tub/shower seat    Recommendations for Other Services       Precautions / Restrictions Precautions Precautions: Fall Precaution Comments: seizures Restrictions Weight Bearing Restrictions: No      Mobility Bed Mobility Overal bed mobility: Needs Assistance Bed Mobility: Supine to Sit     Supine to sit: Supervision     General bed mobility comments: no assistance  Transfers Overall transfer level: Needs assistance Equipment used: None Transfers: Sit to/from Stand;Stand Pivot Transfers Sit to Stand: Min guard Stand  pivot transfers: Min guard       General transfer comment: initially mild loss of balance with standing but then steadied herself. Patient ambulated around the bed to recliner with min guard.    Balance Overall balance assessment: Mild deficits observed, not formally tested                                         ADL either performed or assessed with clinical judgement   ADL Overall ADL's : Needs assistance/impaired Eating/Feeding: Independent   Grooming: Wash/dry face;Wash/dry hands;Sitting   Upper Body Bathing: Set up;Sitting   Lower Body Bathing: Set up;Sit to/from stand;Min guard   Upper Body Dressing : Set up;Sitting   Lower Body Dressing: Set up;Min guard;Sit to/from stand Lower Body Dressing Details (indicate cue type and reason): Patient able to donn socks at side of bed                     Vision   Vision Assessment?: No apparent visual deficits     Perception     Praxis      Pertinent Vitals/Pain Pain Assessment: No/denies pain     Hand Dominance Right   Extremity/Trunk Assessment Upper Extremity Assessment Upper Extremity Assessment: Overall WFL for tasks assessed   Lower Extremity Assessment Lower Extremity Assessment: Defer to PT evaluation   Cervical / Trunk Assessment Cervical / Trunk Assessment: Normal   Communication Communication Communication: No difficulties   Cognition Arousal/Alertness: Awake/alert Behavior During Therapy: WFL for tasks assessed/performed Overall Cognitive Status: Within Functional Limits for  tasks assessed                                     General Comments       Exercises     Shoulder Instructions      Home Living Family/patient expects to be discharged to:: Private residence Living Arrangements: Children;Other relatives (daughter and son in law) Available Help at Discharge: Family;Available PRN/intermittently (children work. Ex mother in law lives there as  well.) Type of Home: House Home Access: Level entry       Alternate Level Stairs-Number of Steps: 15 Alternate Level Stairs-Rails: Left Bathroom Shower/Tub: Occupational psychologist: Standard Bathroom Accessibility: Yes How Accessible: Accessible via walker Home Equipment: Walker - 2 wheels   Additional Comments: Pt reports she lives with her daughter and her daughter's family      Prior Functioning/Environment Level of Independence: Independent                 OT Problem List: Decreased activity tolerance;Decreased knowledge of use of DME or AE      OT Treatment/Interventions: Self-care/ADL training;Energy conservation;DME and/or AE instruction;Therapeutic activities;Patient/family education    OT Goals(Current goals can be found in the care plan section) Acute Rehab OT Goals Patient Stated Goal: to go home OT Goal Formulation: With patient Time For Goal Achievement: 04/18/20 Potential to Achieve Goals: Good  OT Frequency: Min 2X/week   Barriers to D/C:            Co-evaluation              AM-PAC OT "6 Clicks" Daily Activity     Outcome Measure Help from another person eating meals?: None Help from another person taking care of personal grooming?: A Little Help from another person toileting, which includes using toliet, bedpan, or urinal?: A Little Help from another person bathing (including washing, rinsing, drying)?: A Little Help from another person to put on and taking off regular upper body clothing?: A Little Help from another person to put on and taking off regular lower body clothing?: A Little 6 Click Score: 19   End of Session Equipment Utilized During Treatment: Gait belt;Oxygen Nurse Communication: Mobility status  Activity Tolerance: Patient tolerated treatment well Patient left: in chair;with call bell/phone within reach;with chair alarm set  OT Visit Diagnosis: Unsteadiness on feet (R26.81)                Time:  3300-7622 OT Time Calculation (min): 27 min Charges:  OT General Charges $OT Visit: 1 Visit OT Evaluation $OT Eval Low Complexity: 1 Low  Kramer Hanrahan, OTR/L [place]  Office [phone] Pager: [place] 04/04/2020, 12:52 PM

## 2020-04-04 NOTE — Progress Notes (Signed)
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  Patient underwent outpatient testing for Covid-19 on 03/29/2020 was found to be positive after sick contact with son-in-law.  Presented to [facility] health infusion clinic for monoclonal antibody infusion on 04/02/2020 but upon arrival was noted to be dyspneic with SPO2 between 82-86%.  She was then subsequently transferred to the [facility] long ED for further evaluation.  In the ED, Covid-19 testing confirmed to be positive.  Patient with a lactic acidosis of 2.3 and requiring 3 L supple oxygen to maintain adequate SPO2. EDP referred patient to the hospital service for further evaluation and treatment for respiratory failure secondary to Covid-19 viral pneumonia.   Assessment & Plan:   Principal Problem:   COVID-19 virus infection Active Problems:   Seizure disorder (HCC)   Hypokalemia, inadequate intake   COPD with exacerbation (HCC)   Acute respiratory failure with hypoxia (HCC)   Lactic acidosis   Acute hypoxic respiratory failure secondary to acute Covid-19 viral pneumonia during the ongoing 2020/2021 Covid 19 Pandemic - POA Patient presenting from the ED from the infusion center after being found hypoxic. --COVID test: PCR positive 04/02/2020 --CRP 8.0>7.1 --ddimer 0.60>0.60 --Remdesivir, plan 5-day course (Day 3/5 ) --Baricitinib 4mg  PO daily (Day 3/14 ) --Continue Solumedrol 40 mg IV every 12 hours --prone for 2-3hrs every 12hrs if able --Continue supplemental oxygen, titrate to maintain SPO2 greater than 92% --Continue supportive care with albuterol MDI prn, vitamin C, zinc, Tylenol, antitussives (benzonatate/ Mucinex/Tussionex) --Follow  CBC, CMP, D-dimer, ferritin, and CRP daily --Continue airborne/contact isolation precautions for 3 weeks from the day of diagnosis  The treatment plan and use of medications and known side effects were discussed with patient/family. Some of the medications used are based on case reports/anecdotal data.  All other medications being used in the management of COVID-19 based on limited study data.  Complete risks and long-term side effects are unknown, however in the best clinical judgment they seem to be of some benefit.  Patient wanted to proceed with treatment options provided.  COPD exacerbation --Albuterol MDI every 6 hours --Solu-Medrol 40 mg IV every 12 hours --Continue supplemental oxygen, maintain SPO2 greater than 88%  Lactic acidosis:Resolved Etiology likely secondary to tissue hypoxemia from Covid-19 viral pneumonia as above. --lactic acid 2.3>0.8  Depression: Continue Lexapro 10 mg p.o. daily  Seizure disorder Complicated history of seizure disorder secondary to remote history of right parieto-occipital AVM rupture status post craniotomy 1998. --Continue Cenobamate 100mg  PO daily --Continue Vimpat 200mg  PO BID --Continue Dilantin 130 mg qAm and 100mg  qHS  Hypokalemia Potassium 3.6, magnesium 2.2 today.  Previously repleted during hospitalization. --Follow electrolytes closely daily   DVT prophylaxis: Lovenox Code Status: Full code Family Communication: Discussed with patient extensively at bedside  Disposition Plan:  Status is: Inpatient  Remains inpatient appropriate because:Unsafe d/c plan, IV treatments appropriate due to intensity of illness or inability to take PO and Inpatient level of care appropriate due to severity of illness   Dispo: The patient is from: Home              Anticipated d/c is to: Home              Anticipated  d/c date is: 3 days              Patient currently is not medically stable to d/c.        Consultants:   None  Procedures:    None  Antimicrobials:   Remdesivir 8/30>>  Baricitinib 8/30>>   Subjective: Patient seen and examined bedside, resting comfortably in bed.  Continues with significant dyspnea even at rest.  Continues on supplemental oxygen, denies home O2 needs.  Also complains of weakness/fatigue.  No other complaints or concerns at this time.  Denies headache, no fever/chills/night sweats, no nausea/vomiting/diarrhea, no chest pain, palpitation, no abdominal pain.  No acute events overnight per nursing staff.  Objective: Vitals:   04/03/20 1833 04/03/20 2233 04/04/20 0233 04/04/20 0500  BP: 118/66 (!) 116/56 (!) 91/54 (!) 149/70  Pulse: 82 72 85 70  Resp: 15 16 18 19   Temp: 98.9 F (37.2 C) 98.9 F (37.2 C) 98.8 F (37.1 C) 98.2 F (36.8 C)  TempSrc: Oral Oral Oral Oral  SpO2: 98% 97% 100% 93%  Weight:      Height:        Intake/Output Summary (Last 24 hours) at 04/04/2020 1304 Last data filed at 04/04/2020 0500 Gross per 24 hour  Intake 340 ml  Output --  Net 340 ml   Filed Weights   04/02/20 1705  Weight: 63 kg    Examination:  General exam: Appears calm and comfortable  Respiratory system: Scattered late expiratory wheezing bilaterally, normal respiratory effort, on 2 L nasal cannula with SPO2 93% Cardiovascular system: S1 & S2 heard, RRR. No JVD, murmurs, rubs, gallops or clicks. No pedal edema. Gastrointestinal system: Abdomen is nondistended, soft and nontender. No organomegaly or masses felt. Normal bowel sounds heard. Central nervous system: Alert and oriented. No focal neurological deficits. Extremities: Symmetric 5 x 5 power. Skin: No rashes, lesions or ulcers Psychiatry: Judgement and insight appear normal. Mood & affect appropriate.     Data Reviewed: I have personally reviewed following labs and imaging studies  CBC: Recent Labs  Lab 04/02/20 1730 04/03/20 0506 04/04/20 0329  WBC 3.0* 2.5* 1.9*  NEUTROABS 2.1 1.6* 1.1*  HGB 12.8 12.1 12.5  HCT 39.4 37.0  37.2  MCV 103.4* 103.1* 104.8*  PLT 127* 127* 761*   Basic Metabolic Panel: Recent Labs  Lab 04/02/20 1730 04/03/20 0506 04/04/20 0329  NA 141 138 138  K 3.2* 3.7 3.6  CL 101 103 103  CO2 29 26 25   GLUCOSE 106* 101* 159*  BUN 10 7* 10  CREATININE 0.71 0.57 0.51  CALCIUM 8.5* 8.1* 8.3*  MG  --  1.8 2.2  PHOS  --  3.4 3.3   GFR: Estimated Creatinine Clearance: 70.9 mL/min (by C-G formula based on SCr of 0.51 mg/dL). Liver Function Tests: Recent Labs  Lab 04/02/20 1730 04/03/20 0506 04/04/20 0329  AST 32 25 21  ALT 22 17 16   ALKPHOS 45 41 35*  BILITOT 0.3 0.3 0.4  PROT 6.6 6.0* 5.9*  ALBUMIN 3.4* 3.0* 2.9*   No results for input(s): LIPASE, AMYLASE in the last 168 hours. No results for input(s): AMMONIA in the last 168 hours. Coagulation Profile: No results for input(s): INR, PROTIME in the last 168 hours. Cardiac Enzymes: No results for input(s): CKTOTAL, CKMB, CKMBINDEX, TROPONINI in the last 168 hours. BNP (last 3 results) No results for input(s): PROBNP in the last 8760 hours. HbA1C: No results for input(s): HGBA1C in the last 72 hours. CBG: Recent Labs  Lab 04/04/20 0832 04/04/20 1148  GLUCAP 92 133*   Lipid Profile: Recent Labs    04/02/20 1730  TRIG 80   Thyroid Function Tests: No results for input(s): TSH, T4TOTAL, FREET4, T3FREE, THYROIDAB in the last 72 hours. Anemia Panel: Recent Labs    04/02/20 1730  FERRITIN 279   Sepsis Labs: Recent Labs  Lab 04/02/20 1730 04/02/20 1915  PROCALCITON <0.10  --   LATICACIDVEN 2.3* 0.8    Recent Results (from the past 240 hour(s))  Blood Culture (routine x 2)     Status: None (Preliminary result)   Collection Time: 04/02/20  5:30 PM   Specimen: BLOOD RIGHT HAND  Result Value Ref Range Status   Specimen Description   Final    BLOOD RIGHT HAND Performed at [facility] [address], [place], [place] [address]    Special Requests   Final    BOTTLES DRAWN AEROBIC ONLY  Blood Culture results may not be optimal due to an inadequate volume of blood received in culture bottles Performed at [facility] [address], [place], [place] [address]    Culture   Final    NO GROWTH 2 DAYS Performed at [facility] [address], [place], [place] [address]    Report Status PENDING  Incomplete  SARS Coronavirus 2 by RT PCR (hospital order, performed in [facility] hospital lab) Nasopharyngeal Nasopharyngeal Swab     Status: Abnormal   Collection Time: 04/02/20  5:43 PM   Specimen: Nasopharyngeal Swab  Result Value Ref Range Status   SARS Coronavirus 2 POSITIVE (A) NEGATIVE Final    Comment: RESULT CALLED TO, READ BACK BY AND VERIFIED WITH: TALKINGTON,J. RN @2023  8/30/21BILLINGSLEY,L (NOTE) SARS-CoV-2 target nucleic acids are DETECTED  SARS-CoV-2 RNA is generally detectable in upper respiratory specimens  during the acute phase of infection.  Positive results are indicative  of the presence of the identified virus, but do not rule out bacterial infection or co-infection with other pathogens not detected by the test.  Clinical correlation with patient history and  other diagnostic information is necessary to determine patient infection status.  The expected result is negative.  Fact Sheet for Patients:   StrictlyIdeas.no   Fact Sheet for Healthcare Providers:   BankingDealers.co.za    This test is not yet approved or cleared by the [place] FDA and  has been authorized for detection and/or diagnosis of SARS-CoV-2 by FDA under an Emergency Use Authorization (EUA).  This EUA will remain in effect (meanin g this test can be used) for the duration of  the COVID-19 declaration under Section 564(b)(1) of the Act, 21 U.S.C. section 360-bbb-3(b)(1), unless the authorization is terminated or revoked sooner.  Performed at [facility], [place] [address], [place], [place] [address]          Radiology Studies: DG Chest Port 1 View  Result Date: 04/02/2020 CLINICAL DATA:  Hypoxia EXAM: PORTABLE CHEST 1 VIEW COMPARISON:  08/26/2019 FINDINGS: The heart size and mediastinal contours are within normal limits. Both lungs are clear. The visualized skeletal structures are unremarkable. IMPRESSION: No active disease. Electronically Signed   By: Fidela Salisbury MD   On: 04/02/2020 18:10        Scheduled Meds: . albuterol  2 puff Inhalation Q6H  . vitamin C  500 mg Oral Daily  . Cenobamate  100 mg Oral Daily  . enoxaparin (LOVENOX) injection  40 mg Subcutaneous Q24H  . escitalopram  10 mg Oral Daily  . feeding supplement (ENSURE ENLIVE)  237 mL Oral BID BM  . gabapentin  300 mg Oral QHS  . lacosamide  200 mg Oral BID  . LORazepam  1 mg Oral QHS  . methylPREDNISolone (SOLU-MEDROL) injection  40 mg Intravenous Q12H  . multivitamin with minerals  1 tablet Oral Daily  . phenytoin  100 mg Oral QHS  . [START ON 04/05/2020] phenytoin  130 mg Oral QAC breakfast  . zinc sulfate  220 mg Oral Daily   Continuous Infusions: . remdesivir 100 mg in NS 100 mL 100 mg (04/04/20 0946)     LOS: 2 days    Time spent: 38 minutes spent on chart review, discussion with nursing staff, consultants, updating family and interview/physical exam; more than 50% of that time was spent in counseling and/or coordination of care.    Starr Urias J [place] DO [facility] Available via Epic secure chat 7am-7pm After these hours, please refer to coverage provider listed on [email or web address] 04/04/2020, 1:04 PM

## 2020-04-04 NOTE — Progress Notes (Signed)
Initial Nutrition Assessment  INTERVENTION:   -Ensure Enlive po BID, each supplement provides 350 kcal and 20 grams of protein -Magic cup BID with meals, each supplement provides 290 kcal and 9 grams of protein -Multivitamin with minerals daily  NUTRITION DIAGNOSIS:   Increased nutrient needs related to acute illness (COVID-19 infection) as evidenced by estimated needs.  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 Patient eventually presented to a [place] infusion clinic for monoclonal antibody infusion earlier in the day on 8/30 but upon arrival was noted to be short of breath with oxygen saturations of between 82 and 86%.  Patient currently consuming 75% of meals. Reported poor appetite over the past week once symptoms worsened. Will order protein supplements and daily MVI. Noted that pt has food allergies to chocolate and olive oil.  Medications: Vitamin C, zinc sulfate Labs reviewed: CBGs: 92  NUTRITION - FOCUSED PHYSICAL EXAM:  Unable to complete  Diet Order:   Diet Order            Diet regular Room service appropriate? Yes; Fluid consistency: Thin  Diet effective now                 EDUCATION NEEDS:   No education needs have been identified at this time  Skin:  Skin Assessment: Reviewed RN Assessment  Last BM:  8/31  Height:   Ht Readings from Last 1 Encounters:  04/02/20 5\' 7"  (1.702 m)    Weight:   Wt  Readings from Last 1 Encounters:  04/02/20 63 kg   BMI:  Body mass index is 21.75 kg/m.  Estimated Nutritional Needs:   Kcal:  1800-2000  Protein:  90-105g  Fluid:  2L/day   Clayton Bibles, MS, RD, LDN Inpatient Clinical Dietitian Contact information available via Amion

## 2020-04-05 LAB — COMPREHENSIVE METABOLIC PANEL
ALT: 15 U/L (ref 0–44)
AST: 18 U/L (ref 15–41)
Albumin: 2.9 g/dL — ABNORMAL LOW (ref 3.5–5.0)
Alkaline Phosphatase: 38 U/L (ref 38–126)
Anion gap: 11 (ref 5–15)
BUN: 11 mg/dL (ref 8–23)
CO2: 25 mmol/L (ref 22–32)
Calcium: 8.7 mg/dL — ABNORMAL LOW (ref 8.9–10.3)
Chloride: 104 mmol/L (ref 98–111)
Creatinine, Ser: 0.47 mg/dL (ref 0.44–1.00)
GFR calc Af Amer: >60 mL/min (ref >60)
GFR calc non Af Amer: >60 mL/min (ref >60)
Glucose, Bld: 118 mg/dL — ABNORMAL HIGH (ref 70–99)
Potassium: 3.9 mmol/L (ref 3.5–5.1)
Sodium: 140 mmol/L (ref 135–145)
Total Bilirubin: 0.3 mg/dL (ref 0.3–1.2)
Total Protein: 6.1 g/dL — ABNORMAL LOW (ref 6.5–8.1)

## 2020-04-05 LAB — CBC WITH DIFFERENTIAL/PLATELET
Abs Immature Granulocytes: 0.02 10*3/uL (ref 0.00–0.07)
Basophils Absolute: 0 10*3/uL (ref 0.0–0.1)
Basophils Relative: 0 %
Eosinophils Absolute: 0 10*3/uL (ref 0.0–0.5)
Eosinophils Relative: 0 %
HCT: 37.4 % (ref 36.0–46.0)
Hemoglobin: 12.3 g/dL (ref 12.0–15.0)
Immature Granulocytes: 1 %
Lymphocytes Relative: 29 %
Lymphs Abs: 0.8 10*3/uL (ref 0.7–4.0)
MCH: 34.3 pg — ABNORMAL HIGH (ref 26.0–34.0)
MCHC: 32.9 g/dL (ref 30.0–36.0)
MCV: 104.2 fL — ABNORMAL HIGH (ref 80.0–100.0)
Monocytes Absolute: 0.2 10*3/uL (ref 0.1–1.0)
Monocytes Relative: 7 %
Neutro Abs: 1.7 10*3/uL (ref 1.7–7.7)
Neutrophils Relative %: 63 %
Platelets: 184 10*3/uL (ref 150–400)
RBC: 3.59 MIL/uL — ABNORMAL LOW (ref 3.87–5.11)
RDW: 12.6 % (ref 11.5–15.5)
WBC: 2.6 10*3/uL — ABNORMAL LOW (ref 4.0–10.5)
nRBC: 0 % (ref 0.0–0.2)

## 2020-04-05 LAB — PHOSPHORUS: Phosphorus: 3.8 mg/dL (ref 2.5–4.6)

## 2020-04-05 LAB — MAGNESIUM: Magnesium: 1.9 mg/dL (ref 1.7–2.4)

## 2020-04-05 LAB — C-REACTIVE PROTEIN: CRP: 3.4 mg/dL — ABNORMAL HIGH (ref <1.0)

## 2020-04-05 LAB — D-DIMER, QUANTITATIVE: D-Dimer, Quant: 0.46 ug/mL-FEU (ref 0.00–0.50)

## 2020-04-05 NOTE — Progress Notes (Signed)
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  Patient underwent outpatient testing for Covid-19 on 03/29/2020 was found to be positive after sick contact with son-in-law.  Presented to [facility] health infusion clinic for monoclonal antibody infusion on 04/02/2020 but upon arrival was noted to be dyspneic with SPO2 between 82-86%.  She was then subsequently transferred to the [facility] long ED for further evaluation.  In the ED, Covid-19 testing confirmed to be positive.  Patient with a lactic acidosis of 2.3 and requiring 3 L supple oxygen to maintain adequate SPO2. EDP referred patient to the hospital service for further evaluation and treatment for respiratory failure secondary to Covid-19 viral pneumonia.   Assessment & Plan:   Principal Problem:   COVID-19 virus infection Active Problems:   Seizure disorder (HCC)   Hypokalemia, inadequate intake   COPD with exacerbation (HCC)   Acute respiratory failure with hypoxia (HCC)   Lactic acidosis   Acute hypoxic respiratory failure secondary to acute Covid-19 viral pneumonia during the ongoing 2020/2021 Covid 19 Pandemic - POA Patient presenting from the ED from the infusion center after being found hypoxic. --COVID test: PCR positive 04/02/2020 --CRP 8.0>7.1>3.4 --ddimer 0.60>0.60>0.46 --Remdesivir, plan 5-day course (Day #4/5) --Baricitinib 4mg  PO daily (Day #4/14 ) --Continue Solumedrol 40 mg IV every 12 hours --prone for 2-3hrs every 12hrs if able --Continue supplemental oxygen, titrate to maintain SPO2 greater than 88%; currently on 4 L nasal cannula with SPO2 92% --Continue supportive care with albuterol MDI prn, vitamin C, zinc, Tylenol,  antitussives (benzonatate/ Mucinex/Tussionex) --Follow CBC, CMP, D-dimer, ferritin, and CRP daily --Continue airborne/contact isolation precautions for 3 weeks from the day of diagnosis  The treatment plan and use of medications and known side effects were discussed with patient/family. Some of the medications used are based on case reports/anecdotal data.  All other medications being used in the management of COVID-19 based on limited study data.  Complete risks and long-term side effects are unknown, however in the best clinical judgment they seem to be of some benefit.  Patient wanted to proceed with treatment options provided.  COPD exacerbation --Albuterol MDI every 6 hours --Solu-Medrol 40 mg IV every 12 hours --Continue supplemental oxygen, maintain SPO2 greater than 88%  Lactic acidosis:Resolved Etiology likely secondary to tissue hypoxemia from Covid-19 viral pneumonia as above. --lactic acid 2.3>0.8  Depression: Continue Lexapro 10 mg p.o. daily  Seizure disorder Complicated history of seizure disorder secondary to remote history of right parieto-occipital AVM rupture status post craniotomy 1998. --Continue Cenobamate 100mg  PO daily --Continue Vimpat 200mg  PO BID --Continue Dilantin 130 mg qAm and 100mg  qHS  Hypokalemia Potassium 3.9, magnesium 1.9 today.  Previously repleted during hospitalization. --Follow electrolytes closely daily   DVT prophylaxis: Lovenox Code Status: Full code Family Communication: Discussed with patient extensively at bedside  Disposition Plan:  Status is: Inpatient  Remains inpatient appropriate because:Unsafe d/c plan, IV treatments appropriate due to intensity of illness or inability to take PO and Inpatient level of care appropriate due to severity of illness   Dispo: The patient is from: Home              Anticipated d/c is to: Home  Anticipated d/c date is: 2 days              Patient currently is not medically stable to  d/c.   Consultants:   None  Procedures:   None  Antimicrobials:   Remdesivir 8/30>>  Baricitinib 8/30>>   Subjective: Patient seen and examined bedside, resting comfortably in bed.  Continues with significant dyspnea even at rest; feels slightly improved since yesterday.  Continues on supplemental oxygen, which was increased overnight to 4 L from 2 L yesterday.  Also complains of weakness/fatigue.  No other complaints or concerns at this time.  Denies headache, no fever/chills/night sweats, no nausea/vomiting/diarrhea, no chest pain, palpitation, no abdominal pain.  No acute events overnight per nursing staff.  Objective: Vitals:   04/04/20 1400 04/04/20 1922 04/05/20 0313 04/05/20 0856  BP: (!) 114/59 (!) 108/59 (!) 89/47   Pulse: 93 80 75   Resp: 20 18 16    Temp: 98.3 F (36.8 C) 98.3 F (36.8 C) 97.6 F (36.4 C)   TempSrc: Oral Oral Oral   SpO2: 93% 97% 92% 95%  Weight:      Height:        Intake/Output Summary (Last 24 hours) at 04/05/2020 1054 Last data filed at 04/05/2020 0600 Gross per 24 hour  Intake 720 ml  Output --  Net 720 ml   Filed Weights   04/02/20 1705  Weight: 63 kg    Examination:  General exam: Appears calm and comfortable  Respiratory system: Scattered late expiratory wheezing bilaterally, normal respiratory effort, on 4 L nasal cannula with SPO2 92% (not on home O2) Cardiovascular system: S1 & S2 heard, RRR. No JVD, murmurs, rubs, gallops or clicks. No pedal edema. Gastrointestinal system: Abdomen is nondistended, soft and nontender. No organomegaly or masses felt. Normal bowel sounds heard. Central nervous system: Alert and oriented. No focal neurological deficits. Extremities: Symmetric 5 x 5 power. Skin: No rashes, lesions or ulcers Psychiatry: Judgement and insight appear normal. Mood & affect appropriate.     Data Reviewed: I have personally reviewed following labs and imaging studies  CBC: Recent Labs  Lab 04/02/20 1730  04/03/20 0506 04/04/20 0329 04/05/20 0345  WBC 3.0* 2.5* 1.9* 2.6*  NEUTROABS 2.1 1.6* 1.1* 1.7  HGB 12.8 12.1 12.5 12.3  HCT 39.4 37.0 37.2 37.4  MCV 103.4* 103.1* 104.8* 104.2*  PLT 127* 127* 143* 761   Basic Metabolic Panel: Recent Labs  Lab 04/02/20 1730 04/03/20 0506 04/04/20 0329 04/05/20 0345  NA 141 138 138 140  K 3.2* 3.7 3.6 3.9  CL 101 103 103 104  CO2 29 26 25 25   GLUCOSE 106* 101* 159* 118*  BUN 10 7* 10 11  CREATININE 0.71 0.57 0.51 0.47  CALCIUM 8.5* 8.1* 8.3* 8.7*  MG  --  1.8 2.2 1.9  PHOS  --  3.4 3.3 3.8   GFR: Estimated Creatinine Clearance: 70.9 mL/min (by C-G formula based on SCr of 0.47 mg/dL). Liver Function Tests: Recent Labs  Lab 04/02/20 1730 04/03/20 0506 04/04/20 0329 04/05/20 0345  AST 32 25 21 18   ALT 22 17 16 15   ALKPHOS 45 41 35* 38  BILITOT 0.3 0.3 0.4 0.3  PROT 6.6 6.0* 5.9* 6.1*  ALBUMIN 3.4* 3.0* 2.9* 2.9*   No results for input(s): LIPASE, AMYLASE in the last 168 hours. No results for input(s): AMMONIA in the last 168 hours. Coagulation Profile: No results for input(s): INR, PROTIME in the last 168 hours. Cardiac Enzymes: No results for input(s):  CKTOTAL, CKMB, CKMBINDEX, TROPONINI in the last 168 hours. BNP (last 3 results) No results for input(s): PROBNP in the last 8760 hours. HbA1C: No results for input(s): HGBA1C in the last 72 hours. CBG: Recent Labs  Lab 04/04/20 0832 04/04/20 1148  GLUCAP 92 133*   Lipid Profile: Recent Labs    04/02/20 1730  TRIG 80   Thyroid Function Tests: No results for input(s): TSH, T4TOTAL, FREET4, T3FREE, THYROIDAB in the last 72 hours. Anemia Panel: Recent Labs    04/02/20 1730  FERRITIN 279   Sepsis Labs: Recent Labs  Lab 04/02/20 1730 04/02/20 1915  PROCALCITON <0.10  --   LATICACIDVEN 2.3* 0.8    Recent Results (from the past 240 hour(s))  Blood Culture (routine x 2)     Status: None (Preliminary result)   Collection Time: 04/02/20  5:30 PM   Specimen:  BLOOD RIGHT HAND  Result Value Ref Range Status   Specimen Description   Final    BLOOD RIGHT HAND Performed at [facility] [address], [place], [place] [address]    Special Requests   Final    BOTTLES DRAWN AEROBIC ONLY Blood Culture results may not be optimal due to an inadequate volume of blood received in culture bottles Performed at [facility] [address] [place], [place] [address]    Culture   Final    NO GROWTH 3 DAYS Performed at [facility], [place] [address], [place], [place] [address]    Report Status PENDING  Incomplete  SARS Coronavirus 2 by RT PCR (hospital order, performed in [facility] hospital lab) Nasopharyngeal Nasopharyngeal Swab     Status: Abnormal   Collection Time: 04/02/20  5:43 PM   Specimen: Nasopharyngeal Swab  Result Value Ref Range Status   SARS Coronavirus 2 POSITIVE (A) NEGATIVE Final    Comment: RESULT CALLED TO, READ BACK BY AND VERIFIED WITH: TALKINGTON,J. RN @2023  8/30/21BILLINGSLEY,L (NOTE) SARS-CoV-2 target nucleic acids are DETECTED  SARS-CoV-2 RNA is generally detectable in upper respiratory specimens  during the acute phase of infection.  Positive results are indicative  of the presence of the identified virus, but do not rule out bacterial infection or co-infection with other pathogens not detected by the test.  Clinical correlation with patient history and  other diagnostic information is necessary to determine patient infection status.  The expected result is negative.  Fact Sheet for Patients:   StrictlyIdeas.no   Fact Sheet for Healthcare Providers:   BankingDealers.co.za    This test is not yet approved or cleared by the [place] FDA and  has been authorized for detection and/or diagnosis of SARS-CoV-2 by FDA under an Emergency Use Authorization (EUA).  This EUA will remain in effect (meanin g this test can be used) for the  duration of  the COVID-19 declaration under Section 564(b)(1) of the Act, 21 U.S.C. section 360-bbb-3(b)(1), unless the authorization is terminated or revoked sooner.  Performed at [facility], [place] [address], [place], [place] [address]          Radiology Studies: No results found.      Scheduled Meds: . albuterol  2 puff Inhalation Q6H  . vitamin C  500 mg Oral Daily  . Cenobamate  100 mg Oral Daily  . enoxaparin (LOVENOX) injection  40 mg Subcutaneous Q24H  . escitalopram  10 mg Oral Daily  . feeding supplement (ENSURE ENLIVE)  237 mL Oral BID BM  . gabapentin  300 mg Oral QHS  . lacosamide  200 mg Oral BID  . LORazepam  1 mg Oral QHS  . methylPREDNISolone (SOLU-MEDROL) injection  40 mg Intravenous Q12H  . multivitamin with minerals  1 tablet Oral Daily  . phenytoin  100 mg Oral QHS  . phenytoin  130 mg Oral QAC breakfast  . zinc sulfate  220 mg Oral Daily   Continuous Infusions: . remdesivir 100 mg in NS 100 mL Stopped (04/04/20 1020)     LOS: 3 days    Time spent: 36 minutes spent on chart review, discussion with nursing staff, consultants, updating family and interview/physical exam; more than 50% of that time was spent in counseling and/or coordination of care.    Zakyia Gagan J [place] DO [facility] Available via Epic secure chat 7am-7pm After these hours, please refer to coverage provider listed on [email or web address] 04/05/2020, 10:54 AM

## 2020-04-05 NOTE — Plan of Care (Signed)
Plan of care reviewed and discussed with the patient. 

## 2020-04-05 NOTE — Progress Notes (Signed)
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    PT Comments    General Gait Details: several laps around room.  Trial RA decreased to 83% and HR 88 with only 1/4 dyspnea.  Moving well.  Granted pt permission to freely move about the room at lib and reported to RN.  Did add an extesion tubing but still too short to reach bathroom.  Needs another O2 extensor but out of stock on unit.  SATURATION QUALIFICATIONS: (This note is used to comply with regulatory documentation for home oxygen)  Patient Saturations on Room Air at Rest = 91%  Patient Saturations on Room Air while Ambulating = 83%  Patient Saturations on 2 Liters of oxygen while Ambulating 25 feet = 89%  Please briefly explain why patient needs home oxygen:  Pt required supplemental oxygen during activity to achieve therapeutic level   Follow Up Recommendations  Home health PT (may not need pt very active)     Equipment Recommendations  None recommended by PT    Recommendations for Other Services       Precautions / Restrictions Precautions Precautions: Fall Precaution Comments: Hx seizures, COPD Restrictions Weight Bearing Restrictions: No    Mobility  Bed Mobility Overal bed mobility: Modified Independent             General bed mobility comments: increased time  Transfers Overall transfer level: Modified independent   Transfers: Sit to/from [employer] transfer comment: good safety cognition and caution to lines/oxygen cord.  Assisted to bathroom pt self able all duties (toileting,  hygiene)  Ambulation/Gait Ambulation/Gait assistance: Supervision Gait Distance (Feet): 25 Feet Assistive device: None Gait Pattern/deviations: Step-through pattern Gait velocity: WFL   General Gait Details: several laps around room.  Trial RA decreased to 83% and HR 88 with only 1/4 dyspnea.  Moving well.  Granted pt permission to freely move about the room at lib and reported to RN.  Did add an extesion tubing but still too short to reach bathroom.  Needs another O2 extensor but out of stock on unit.   Stairs             Wheelchair Mobility    Modified Rankin (Stroke Patients Only)       Balance                                            Cognition Arousal/Alertness: Awake/alert Behavior During Therapy: WFL for tasks assessed/performed Overall Cognitive Status: Within Functional Limits for tasks assessed                                 General Comments: AxO x 3 motivated "I'm not use to all this laying around"      Exercises      General Comments        Pertinent Vitals/Pain Pain Assessment: No/denies pain    Home Living  Prior Function            PT Goals (current goals can now be found in the care plan section) Progress towards PT goals: Progressing toward goals    Frequency    Min 3X/week      PT Plan Current plan remains appropriate    Co-evaluation              AM-PAC PT "6 Clicks" Mobility   Outcome Measure  Help needed turning from your back to your side while in a flat bed without using bedrails?: None Help needed moving from lying on your back to sitting on the side of a flat bed without using bedrails?: None Help needed moving to and from a bed to a chair (including a wheelchair)?: None Help needed standing up from a chair using your arms (e.g., wheelchair or bedside chair)?: None Help needed to walk in hospital room?: A Little Help needed climbing 3-5 steps  with a railing? : A Little 6 Click Score: 22    End of Session Equipment Utilized During Treatment: Gait belt Activity Tolerance: Patient tolerated treatment well Patient left: in bed;with call bell/phone within reach Nurse Communication: Mobility status PT Visit Diagnosis: Unsteadiness on feet (R26.81)     Time: 1315-1340 PT Time Calculation (min) (ACUTE ONLY): 25 min  Charges:  $Gait Training: 8-22 mins $Therapeutic Activity: 8-22 mins                     Rica Koyanagi  PTA Acute  Rehabilitation Services Pager      [phone] Office      [phone]

## 2020-04-05 NOTE — Care Management Important Message (Signed)
Important Message  Patient Details IM Letter given to the Patient Name: [name] MRN: [id number] Date of Birth: 1955-12-29   Medicare Important Message Given:  Yes     Kerin Salen 04/05/2020, 10:46 AM

## 2020-04-06 LAB — CBC WITH DIFFERENTIAL/PLATELET
Abs Immature Granulocytes: 0.02 10*3/uL (ref 0.00–0.07)
Basophils Absolute: 0 10*3/uL (ref 0.0–0.1)
Basophils Relative: 0 %
Eosinophils Absolute: 0 10*3/uL (ref 0.0–0.5)
Eosinophils Relative: 0 %
HCT: 37.6 % (ref 36.0–46.0)
Hemoglobin: 12 g/dL (ref 12.0–15.0)
Immature Granulocytes: 1 %
Lymphocytes Relative: 24 %
Lymphs Abs: 0.6 10*3/uL — ABNORMAL LOW (ref 0.7–4.0)
MCH: 33.7 pg (ref 26.0–34.0)
MCHC: 31.9 g/dL (ref 30.0–36.0)
MCV: 105.6 fL — ABNORMAL HIGH (ref 80.0–100.0)
Monocytes Absolute: 0.2 10*3/uL (ref 0.1–1.0)
Monocytes Relative: 6 %
Neutro Abs: 1.7 10*3/uL (ref 1.7–7.7)
Neutrophils Relative %: 69 %
Platelets: 186 10*3/uL (ref 150–400)
RBC: 3.56 MIL/uL — ABNORMAL LOW (ref 3.87–5.11)
RDW: 12.6 % (ref 11.5–15.5)
WBC: 2.5 10*3/uL — ABNORMAL LOW (ref 4.0–10.5)
nRBC: 0 % (ref 0.0–0.2)

## 2020-04-06 LAB — COMPREHENSIVE METABOLIC PANEL
ALT: 21 U/L (ref 0–44)
AST: 28 U/L (ref 15–41)
Albumin: 3.1 g/dL — ABNORMAL LOW (ref 3.5–5.0)
Alkaline Phosphatase: 40 U/L (ref 38–126)
Anion gap: 11 (ref 5–15)
BUN: 11 mg/dL (ref 8–23)
CO2: 26 mmol/L (ref 22–32)
Calcium: 8.6 mg/dL — ABNORMAL LOW (ref 8.9–10.3)
Chloride: 106 mmol/L (ref 98–111)
Creatinine, Ser: 0.42 mg/dL — ABNORMAL LOW (ref 0.44–1.00)
GFR calc Af Amer: >60 mL/min (ref >60)
GFR calc non Af Amer: >60 mL/min (ref >60)
Glucose, Bld: 123 mg/dL — ABNORMAL HIGH (ref 70–99)
Potassium: 4.1 mmol/L (ref 3.5–5.1)
Sodium: 143 mmol/L (ref 135–145)
Total Bilirubin: 0.4 mg/dL (ref 0.3–1.2)
Total Protein: 5.9 g/dL — ABNORMAL LOW (ref 6.5–8.1)

## 2020-04-06 LAB — D-DIMER, QUANTITATIVE: D-Dimer, Quant: 0.51 ug/mL-FEU — ABNORMAL HIGH (ref 0.00–0.50)

## 2020-04-06 LAB — MAGNESIUM: Magnesium: 2.1 mg/dL (ref 1.7–2.4)

## 2020-04-06 LAB — C-REACTIVE PROTEIN: CRP: 1.6 mg/dL — ABNORMAL HIGH (ref <1.0)

## 2020-04-06 LAB — PHOSPHORUS: Phosphorus: 4.1 mg/dL (ref 2.5–4.6)

## 2020-04-06 MED ORDER — SALINE SPRAY 0.65 % NA SOLN
1.0000 | NASAL | Status: DC | PRN
  Filled 2020-04-06: qty 44

## 2020-04-06 NOTE — Progress Notes (Signed)
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  Patient underwent outpatient testing for Covid-19 on 03/29/2020 was found to be positive after sick contact with son-in-law.  Presented to [facility] health infusion clinic for monoclonal antibody infusion on 04/02/2020 but upon arrival was noted to be dyspneic with SPO2 between 82-86%.  She was then subsequently transferred to the [facility] long ED for further evaluation.  In the ED, Covid-19 testing confirmed to be positive.  Patient with a lactic acidosis of 2.3 and requiring 3 L supple oxygen to maintain adequate SPO2. EDP referred patient to the hospital service for further evaluation and treatment for respiratory failure secondary to Covid-19 viral pneumonia.   Assessment & Plan:   Principal Problem:   COVID-19 virus infection Active Problems:   Seizure disorder (HCC)   Hypokalemia, inadequate intake   COPD with exacerbation (HCC)   Acute respiratory failure with hypoxia (HCC)   Lactic acidosis   Acute hypoxic respiratory failure secondary to acute Covid-19 viral pneumonia during the ongoing 2020/2021 Covid 19 Pandemic - POA Patient presenting from the ED from the infusion center after being found hypoxic. --COVID test: PCR positive 04/02/2020 --CRP 8.0>7.1>3.4>1.6 --ddimer 0.60>0.60>0.46>0.51 --Remdesivir, plan 5-day course (Day #5/5) --Continue Solumedrol 40 mg IV every 12 hours --prone for 2-3hrs every 12hrs if able --Continue supplemental oxygen, titrate to maintain SPO2 greater than 88%; currently on room air at rest with SPO2 94% --O2 desaturation screen, likely will need short-term home oxygen --Continue supportive care with albuterol MDI  prn, vitamin C, zinc, Tylenol, antitussives (benzonatate/ Mucinex/Tussionex) --Follow CBC, CMP, D-dimer, ferritin, and CRP daily --Continue airborne/contact isolation precautions for 3 weeks from the day of diagnosis  The treatment plan and use of medications and known side effects were discussed with patient/family. Some of the medications used are based on case reports/anecdotal data.  All other medications being used in the management of COVID-19 based on limited study data.  Complete risks and long-term side effects are unknown, however in the best clinical judgment they seem to be of some benefit.  Patient wanted to proceed with treatment options provided.  COPD exacerbation --Albuterol MDI every 6 hours --Solu-Medrol 40 mg IV every 12 hours --Continue supplemental oxygen, maintain SPO2 greater than 88%  Lactic acidosis:Resolved Etiology likely secondary to tissue hypoxemia from Covid-19 viral pneumonia as above. --lactic acid 2.3>0.8  Depression: Continue Lexapro 10 mg p.o. daily  Seizure disorder Complicated history of seizure disorder secondary to remote history of right parieto-occipital AVM rupture status post craniotomy 1998. --Continue Cenobamate 100mg  PO daily --Continue Vimpat 200mg  PO BID --Continue Dilantin 130 mg qAm and 100mg  qHS  Hypokalemia Potassium 3.9, magnesium 1.9 today.  Previously repleted during hospitalization. --Follow electrolytes closely daily   DVT prophylaxis: Lovenox Code Status: Full code Family Communication: Discussed with patient extensively at bedside; updated patient's daughter via telephone this morning  Disposition Plan:  Status is: Inpatient  Remains inpatient appropriate because:Unsafe d/c plan, IV treatments appropriate due to intensity of illness or inability to take PO and Inpatient level of care appropriate due to severity of illness   Dispo: The patient is from: Home              Anticipated d/c  is to: Home               Anticipated d/c date is: 1 day              Patient currently is not medically stable to d/c.   Consultants:   None  Procedures:   None  Antimicrobials:   Remdesivir 8/30 - 9/3  Baricitinib 8/30>>   Subjective: Patient seen and examined bedside, resting comfortably in bed.  Dyspnea improved.  Titrated off of supplemental oxygen at rest with SPO2 94%.  Continues with significant exertional dyspnea.  Will assess home O2 desaturation screen today, but anticipate need for short-term home oxygen.  Social work updated.  Continues with generalized weakness/fatigue.  No other complaints or concerns at this time. Denies headache, no fever/chills/night sweats, no nausea/vomiting/diarrhea, no chest pain, palpitation, no abdominal pain.  No acute events overnight per nursing staff.  Objective: Vitals:   04/05/20 0856 04/05/20 1344 04/05/20 2001 04/06/20 0420  BP:  (!) 103/53 110/63 (!) 107/56  Pulse:  100 98 75  Resp:  18 20 (!) 21  Temp:  98.3 F (36.8 C) 98.9 F (37.2 C) 98.6 F (37 C)  TempSrc:  Oral    SpO2: 95% 94% 95% 95%  Weight:      Height:        Intake/Output Summary (Last 24 hours) at 04/06/2020 1149 Last data filed at 04/05/2020 2006 Gross per 24 hour  Intake 1174 ml  Output --  Net 1174 ml   Filed Weights   04/02/20 1705  Weight: 63 kg    Examination:  General exam: Appears calm and comfortable  Respiratory system: Scattered late expiratory wheezing bilaterally, normal respiratory effort, on room air at rest with SPO2 94% (not on home O2) Cardiovascular system: S1 & S2 heard, RRR. No JVD, murmurs, rubs, gallops or clicks. No pedal edema. Gastrointestinal system: Abdomen is nondistended, soft and nontender. No organomegaly or masses felt. Normal bowel sounds heard. Central nervous system: Alert and oriented. No focal neurological deficits. Extremities: Symmetric 5 x 5 power. Skin: No rashes, lesions or ulcers Psychiatry: Judgement and insight appear normal.  Mood & affect appropriate.     Data Reviewed: I have personally reviewed following labs and imaging studies  CBC: Recent Labs  Lab 04/02/20 1730 04/03/20 0506 04/04/20 0329 04/05/20 0345 04/06/20 0347  WBC 3.0* 2.5* 1.9* 2.6* 2.5*  NEUTROABS 2.1 1.6* 1.1* 1.7 1.7  HGB 12.8 12.1 12.5 12.3 12.0  HCT 39.4 37.0 37.2 37.4 37.6  MCV 103.4* 103.1* 104.8* 104.2* 105.6*  PLT 127* 127* 143* 184 373   Basic Metabolic Panel: Recent Labs  Lab 04/02/20 1730 04/03/20 0506 04/04/20 0329 04/05/20 0345 04/06/20 0347  NA 141 138 138 140 143  K 3.2* 3.7 3.6 3.9 4.1  CL 101 103 103 104 106  CO2 29 26 25 25 26   GLUCOSE 106* 101* 159* 118* 123*  BUN 10 7* 10 11 11   CREATININE 0.71 0.57 0.51 0.47 0.42*  CALCIUM 8.5* 8.1* 8.3* 8.7* 8.6*  MG  --  1.8 2.2 1.9 2.1  PHOS  --  3.4 3.3 3.8 4.1   GFR: Estimated Creatinine Clearance: 70.9 mL/min (A) (by C-G formula based on SCr of 0.42 mg/dL (L)). Liver Function Tests: Recent Labs  Lab 04/02/20 1730 04/03/20 0506 04/04/20 0329 04/05/20 0345 04/06/20 0347  AST 32 25 21 18 28   ALT 22 17 16 15 21   ALKPHOS 45 41 35* 38 40  BILITOT 0.3 0.3 0.4 0.3  0.4  PROT 6.6 6.0* 5.9* 6.1* 5.9*  ALBUMIN 3.4* 3.0* 2.9* 2.9* 3.1*   No results for input(s): LIPASE, AMYLASE in the last 168 hours. No results for input(s): AMMONIA in the last 168 hours. Coagulation Profile: No results for input(s): INR, PROTIME in the last 168 hours. Cardiac Enzymes: No results for input(s): CKTOTAL, CKMB, CKMBINDEX, TROPONINI in the last 168 hours. BNP (last 3 results) No results for input(s): PROBNP in the last 8760 hours. HbA1C: No results for input(s): HGBA1C in the last 72 hours. CBG: Recent Labs  Lab 04/04/20 0832 04/04/20 1148  GLUCAP 92 133*   Lipid Profile: No results for input(s): CHOL, HDL, LDLCALC, TRIG, CHOLHDL, LDLDIRECT in the last 72 hours. Thyroid Function Tests: No results for input(s): TSH, T4TOTAL, FREET4, T3FREE, THYROIDAB in the last 72  hours. Anemia Panel: No results for input(s): VITAMINB12, FOLATE, FERRITIN, TIBC, IRON, RETICCTPCT in the last 72 hours. Sepsis Labs: Recent Labs  Lab 04/02/20 1730 04/02/20 1915  PROCALCITON <0.10  --   LATICACIDVEN 2.3* 0.8    Recent Results (from the past 240 hour(s))  Blood Culture (routine x 2)     Status: None (Preliminary result)   Collection Time: 04/02/20  5:30 PM   Specimen: BLOOD RIGHT HAND  Result Value Ref Range Status   Specimen Description   Final    BLOOD RIGHT HAND Performed at [facility], [place] [address], [place], [place] [address]    Special Requests   Final    BOTTLES DRAWN AEROBIC ONLY Blood Culture results may not be optimal due to an inadequate volume of blood received in culture bottles Performed at [place] [address] [place], [place] [address]    Culture   Final    NO GROWTH 4 DAYS Performed at [facility], [place] [address] [place], [place] [address]    Report Status PENDING  Incomplete  SARS Coronavirus 2 by RT PCR (hospital order, performed in [facility] hospital lab) Nasopharyngeal Nasopharyngeal Swab     Status: Abnormal   Collection Time: 04/02/20  5:43 PM   Specimen: Nasopharyngeal Swab  Result Value Ref Range Status   SARS Coronavirus 2 POSITIVE (A) NEGATIVE Final    Comment: RESULT CALLED TO, READ BACK BY AND VERIFIED WITH: TALKINGTON,J. RN @2023  8/30/21BILLINGSLEY,L (NOTE) SARS-CoV-2 target nucleic acids are DETECTED  SARS-CoV-2 RNA is generally detectable in upper respiratory specimens  during the acute phase of infection.  Positive results are indicative  of the presence of the identified virus, but do not rule out bacterial infection or co-infection with other pathogens not detected by the test.  Clinical correlation with patient history and  other diagnostic information is necessary to determine patient infection status.  The expected result is negative.  Fact Sheet for  Patients:   StrictlyIdeas.no   Fact Sheet for Healthcare Providers:   BankingDealers.co.za    This test is not yet approved or cleared by the [place] FDA and  has been authorized for detection and/or diagnosis of SARS-CoV-2 by FDA under an Emergency Use Authorization (EUA).  This EUA will remain in effect (meanin g this test can be used) for the duration of  the COVID-19 declaration under Section 564(b)(1) of the Act, 21 U.S.C. section 360-bbb-3(b)(1), unless the authorization is terminated or revoked sooner.  Performed at [facility], [place] [address] [place],  [address]          Radiology Studies: No results found.  Scheduled Meds:  albuterol  2 puff Inhalation Q6H   vitamin C  500 mg Oral Daily   Cenobamate  100 mg Oral Daily   enoxaparin (LOVENOX) injection  40 mg Subcutaneous Q24H   escitalopram  10 mg Oral Daily   feeding supplement (ENSURE ENLIVE)  237 mL Oral BID BM   gabapentin  300 mg Oral QHS   lacosamide  200 mg Oral BID   LORazepam  1 mg Oral QHS   methylPREDNISolone (SOLU-MEDROL) injection  40 mg Intravenous Q12H   multivitamin with minerals  1 tablet Oral Daily   phenytoin  100 mg Oral QHS   phenytoin  130 mg Oral QAC breakfast   zinc sulfate  220 mg Oral Daily   Continuous Infusions:    LOS: 4 days    Time spent: 36 minutes spent on chart review, discussion with nursing staff, consultants, updating family and interview/physical exam; more than 50% of that time was spent in counseling and/or coordination of care.    Miriya Cloer J [place] DO [facility] Available via Epic secure chat 7am-7pm After these hours, please refer to coverage provider listed on [email or web address] 04/06/2020, 11:49 AM

## 2020-04-06 NOTE — TOC Transition Note (Signed)
Transition of Care [facility]) - CM/SW Discharge Note   Patient Details  Name: [name] MRN: [id number] Date of Birth: [birth year]  Transition of [facility] CM/SW Contact:  Trish Mage, LCSW Phone Number: 04/06/2020, 11:54 AM   Clinical Narrative:   Patient is a planned d/c for tomorrow, Saturday.  Spoke with daughter who states they get care coordinated through [facility].  She verbalized appreciating any recommendations that we make here, including [place] services, DME and O2 if needed.  I called [facility], they asked me to FAX orders, and they will follow up with setting up services. Called [employer] to order DME tub bench.  If I get a SAT note today, will order HH O2 through [facility].  If not, RN CM will take care of it on Saturday. TOC will continue to follow during the course of hospitalization.     Final next level of care: [place] Barriers to Discharge: No Barriers Identified   Patient Goals and CMS Choice        Discharge Placement                       Discharge Plan and Services                                     Social Determinants of Health (SDOH) Interventions     Readmission Risk Interventions No flowsheet data found.

## 2020-04-06 NOTE — Plan of Care (Signed)
Covid care plan added.

## 2020-04-07 LAB — COMPREHENSIVE METABOLIC PANEL
ALT: 22 U/L (ref 0–44)
AST: 24 U/L (ref 15–41)
Albumin: 3.1 g/dL — ABNORMAL LOW (ref 3.5–5.0)
Alkaline Phosphatase: 41 U/L (ref 38–126)
Anion gap: 9 (ref 5–15)
BUN: 15 mg/dL (ref 8–23)
CO2: 29 mmol/L (ref 22–32)
Calcium: 8.7 mg/dL — ABNORMAL LOW (ref 8.9–10.3)
Chloride: 102 mmol/L (ref 98–111)
Creatinine, Ser: 0.47 mg/dL (ref 0.44–1.00)
GFR calc Af Amer: >60 mL/min (ref >60)
GFR calc non Af Amer: >60 mL/min (ref >60)
Glucose, Bld: 91 mg/dL (ref 70–99)
Potassium: 3.5 mmol/L (ref 3.5–5.1)
Sodium: 140 mmol/L (ref 135–145)
Total Bilirubin: 0.4 mg/dL (ref 0.3–1.2)
Total Protein: 5.7 g/dL — ABNORMAL LOW (ref 6.5–8.1)

## 2020-04-07 LAB — CBC WITH DIFFERENTIAL/PLATELET
Abs Immature Granulocytes: 0.07 10*3/uL (ref 0.00–0.07)
Basophils Absolute: 0 10*3/uL (ref 0.0–0.1)
Basophils Relative: 0 %
Eosinophils Absolute: 0 10*3/uL (ref 0.0–0.5)
Eosinophils Relative: 1 %
HCT: 40.9 % (ref 36.0–46.0)
Hemoglobin: 13.1 g/dL (ref 12.0–15.0)
Immature Granulocytes: 2 %
Lymphocytes Relative: 38 %
Lymphs Abs: 1.7 10*3/uL (ref 0.7–4.0)
MCH: 34 pg (ref 26.0–34.0)
MCHC: 32 g/dL (ref 30.0–36.0)
MCV: 106.2 fL — ABNORMAL HIGH (ref 80.0–100.0)
Monocytes Absolute: 0.3 10*3/uL (ref 0.1–1.0)
Monocytes Relative: 7 %
Neutro Abs: 2.3 10*3/uL (ref 1.7–7.7)
Neutrophils Relative %: 52 %
Platelets: 190 10*3/uL (ref 150–400)
RBC: 3.85 MIL/uL — ABNORMAL LOW (ref 3.87–5.11)
RDW: 13.1 % (ref 11.5–15.5)
WBC: 4.4 10*3/uL (ref 4.0–10.5)
nRBC: 0 % (ref 0.0–0.2)

## 2020-04-07 LAB — MAGNESIUM: Magnesium: 2 mg/dL (ref 1.7–2.4)

## 2020-04-07 LAB — PHOSPHORUS: Phosphorus: 4.2 mg/dL (ref 2.5–4.6)

## 2020-04-07 LAB — D-DIMER, QUANTITATIVE: D-Dimer, Quant: 0.58 ug/mL-FEU — ABNORMAL HIGH (ref 0.00–0.50)

## 2020-04-07 LAB — CULTURE, BLOOD (ROUTINE X 2): Culture: NO GROWTH

## 2020-04-07 LAB — C-REACTIVE PROTEIN: CRP: 0.9 mg/dL (ref <1.0)

## 2020-04-07 MED ORDER — ASCORBIC ACID 500 MG PO TABS: 500.0000 mg | ORAL_TABLET | Freq: Every day | ORAL | 0 refills | Status: AC

## 2020-04-07 MED ORDER — PREDNISONE 20 MG PO TABS: ORAL_TABLET | ORAL | 0 refills | Status: AC

## 2020-04-07 NOTE — Progress Notes (Signed)
   04/07/20 0541  Assess: MEWS Score  Temp 98.4 F (36.9 C)  BP (!) 98/56  Pulse Rate 75  Resp (!) 22  Level of Consciousness Alert  SpO2 93 %  O2 Device Nasal Cannula  Patient Activity (if Appropriate) In bed  O2 Flow Rate (L/min) 1 L/min  Assess: MEWS Score  MEWS Temp 0  MEWS Systolic 1  MEWS Pulse 0  MEWS RR 1  MEWS LOC 0  MEWS Score 2  MEWS Score Color Yellow  Assess: if the MEWS score is Yellow or Red  Were vital signs taken at a resting state? Yes  Focused Assessment No change from prior assessment  Early Detection of Sepsis Score *See Row Information* Low  MEWS guidelines implemented *See Row Information* No, other (Comment) (pt has has soft BPs )  Treat  Pain Score Asleep  Document  Patient Outcome Other (Comment)  pt is asymptomatic, hx of low b/ps and had several meds at bedtime that have her relaxed. No escalation at this time

## 2020-04-07 NOTE — Discharge Summary (Signed)
Physician Discharge Summary  [name] TDV:[id number] DOB: 04/15/1956 DOA: 04/02/2020  PCP: [name], MD  Admit date: 04/02/2020 Discharge date: 04/07/2020  Admitted From: Home  Disposition:  Home  Recommendations for Outpatient Follow-up:  1. Follow up with PCP in 1-2 weeks 2. Continue prednisone taper on discharge then resume home prednisone 20 mg p.o. daily 3. Continue home isolation for 14 days from initial diagnosis of Covid-19 4. 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  Patient underwent outpatient testing for Covid-19 on 03/29/2020 was found to be positive after sick contact with son-in-law.  Presented to [facility] health infusion clinic for monoclonal antibody infusion on 04/02/2020 but upon arrival was noted to be dyspneic with SPO2 between 82-86%.  She was then subsequently transferred to the [facility] long ED for further evaluation.  In the ED, Covid-19 testing confirmed to be positive.  Patient with a lactic acidosis of 2.3 and requiring 3 L supple oxygen to maintain adequate SPO2. EDP referred patient to the hospital service for further evaluation and treatment for respiratory failure secondary to Covid-19 viral pneumonia.  Hospital course:  Acute hypoxic respiratory failure secondary to acute Covid-19 viral pneumonia during the ongoing 2020/2021 Covid 19 Pandemic - POA Patient presenting to the ED from the infusion center after being found hypoxic. COVID test: PCR positive 04/02/2020.  Patient was started on IV steroids and remdesivir in which she completed a 5-day  course.  Patient's supplemental oxygen needs were slowly titrated down but continues to desaturate on room air especially with ambulation with SPO2 88%.  We will continue steroid taper on discharge and will resume home prednisone following taper.  Continue home isolation for 14 days from initial diagnosis.  Recommend Covid-19 vaccination.  The treatment plan and use of medications and known side effects were discussed with patient/family. Some of the medications used are based on case reports/anecdotal data.  All other medications being used in the management of COVID-19 based on limited study data.  Complete risks and long-term side effects are unknown, however in the best clinical judgment they seem to be of some benefit.  Patient wanted to proceed with treatment options provided.  COPD exacerbation Continue home inhalers, will continue steroid taper as above then resume home prednisone 20 mg p.o. daily.  Lactic acidosis:Resolved Etiology likely secondary to tissue hypoxemia from Covid-19 viral pneumonia as above.  Depression: Continue Lexapro 10 mg p.o. daily  Seizure disorder Complicated history of seizure disorder secondary to remote history of right parieto-occipital AVM rupture status post craniotomy 1998. Continue Cenobamate 100mg  PO daily, Vimpat 200mg  PO BID, Dilantin 130 mg qAm and 100mg  qHS  Hypokalemia Repleted during hospitalization.  Potassium 3.5 and magnesium 2.0 at time of discharge.  Discharge Diagnoses:  Principal Problem:   COVID-19 virus infection Active Problems:   Seizure disorder ([place])   COPD with exacerbation ([place])   Acute respiratory failure with hypoxia ([place])   Lactic acidosis    Discharge Instructions  Discharge Instructions    Call MD for:  difficulty breathing, headache or visual disturbances   Complete by: As directed    Call MD for:  extreme fatigue  Complete by: As directed    Call MD for:  persistant dizziness or light-headedness    Complete by: As directed    Call MD for:  persistant nausea and vomiting   Complete by: As directed    Call MD for:  severe uncontrolled pain   Complete by: As directed    Call MD for:  temperature >100.4   Complete by: As directed    Diet - low sodium heart healthy   Complete by: As directed    Increase activity slowly   Complete by: As directed      Allergies as of 04/07/2020      Reactions   Zonisamide Other (See Comments)   Numbness and tingling over whole body   Chocolate Flavor Other (See Comments)   Trigger for Seizure   Codeine Nausea And Vomiting   Keppra [levetiracetam] Other (See Comments)   Causes seizures   Pregabalin Nausea And Vomiting   Olive Oil Rash      Medication List    STOP taking these medications   cephALEXin 250 MG capsule Commonly known as: KEFLEX     TAKE these medications   acetaminophen 500 MG tablet Commonly known as: TYLENOL Take 1,000 mg by mouth every 6 (six) hours as needed for mild pain.   albuterol 108 (90 Base) MCG/ACT inhaler Commonly known as: VENTOLIN HFA Inhale 1 puff into the lungs every 4 (four) hours as needed for wheezing or shortness of breath.   ascorbic acid 500 MG tablet Commonly known as: VITAMIN C Take 1 tablet (500 mg total) by mouth daily.   cholecalciferol 25 MCG (1000 UNIT) tablet Commonly known as: VITAMIN D3 Take 1 tablet (1,000 Units total) by mouth daily.   [employer] B-12 500 MCG tablet Generic drug: vitamin B-12 TAKE 2 TABLETS BY MOUTH EVERY DAY What changed: Another medication with the same name was removed. Continue taking this medication, and follow the directions you see here.   escitalopram 10 MG tablet Commonly known as: Lexapro Take 1 tablet (10 mg total) by mouth daily.   gabapentin 300 MG capsule Commonly known as: NEURONTIN Take 1 capsule every night   lacosamide 200 MG Tabs tablet Commonly known as: VIMPAT Take 1 tablet (200 mg total) by mouth 2 (two) times daily.   LORazepam 1 MG  tablet Commonly known as: Ativan Take 1 tablet as needed for seizure. Do not take more than 2 in 24 hours. What changed:   how much to take  how to take this  when to take this  additional instructions   phenytoin 100 MG ER capsule Commonly known as: DILANTIN 100mg  in AM, 130mg  in PM What changed:   how much to take  how to take this  when to take this  additional instructions   Dilantin 30 MG ER capsule Generic drug: phenytoin Take 1 capsule (30 mg total) by mouth in the morning. Along with 100mg  capsule What changed: additional instructions   predniSONE 20 MG tablet Commonly known as: DELTASONE Take 20 mg by mouth daily with breakfast. What changed: Another medication with the same name was added. Make sure you understand how and when to take each.   predniSONE 20 MG tablet Commonly known as: DELTASONE Take 3 tablets (60 mg total) by mouth daily with breakfast for 5 days, THEN 2 tablets (40 mg total) daily with breakfast for 5 days. Then resume home 20mg  daily. Start taking on: April 07, 2020 What changed: You were already taking a medication with  the same name, and this prescription was added. Make sure you understand how and when to take each.   Xcopri 100 MG Tabs Generic drug: Cenobamate Take 1 tablet by mouth daily.   ZINC PO Take 1 tablet by mouth daily.            Durable Medical Equipment  (From admission, onward)         Start     Ordered   04/06/20 0653  For home use only DME oxygen  Once       Question Answer Comment  Length of Need 6 Months   Mode or (Route) Nasal cannula   Liters per Minute 2   Frequency Continuous (stationary and portable oxygen unit needed)   Oxygen conserving device Yes   Oxygen delivery system Gas      04/06/20 0655   04/05/20 1121  For home use only DME Tub bench  Once        04/05/20 1120          Follow-up Information    [name], MD. Schedule an appointment as soon as possible for a visit in 1  week(s).   Specialty: Internal Medicine Contact information: [address] [place] [address] [phone]              Allergies  Allergen Reactions  . Zonisamide Other (See Comments)    Numbness and tingling over whole body  . Chocolate Flavor Other (See Comments)    Trigger for Seizure  . Codeine Nausea And Vomiting  . Keppra [Levetiracetam] Other (See Comments)    Causes seizures  . Pregabalin Nausea And Vomiting  . Olive Oil Rash    Consultations:  None   Procedures/Studies: DG Chest Port 1 View  Result Date: 04/02/2020 CLINICAL DATA:  Hypoxia EXAM: PORTABLE CHEST 1 VIEW COMPARISON:  08/26/2019 FINDINGS: The heart size and mediastinal contours are within normal limits. Both lungs are clear. The visualized skeletal structures are unremarkable. IMPRESSION: No active disease. Electronically Signed   By: Fidela Salisbury MD   On: 04/02/2020 18:10      Subjective: Patient seen and examined bedside, resting comfortably.  No complaints this morning.  Completed remdesivir infusion yesterday.  Continues to require supplemental oxygen especially with exertion.  Denies headache, no dizziness, no chest pain, palpitations, no abdominal pain, no congestion, no nausea/vomiting/diarrhea, no fever/chills/night sweats.  No acute events overnight per nursing staff.  Discharging home today with home health and supplemental oxygen.  Discharge Exam: Vitals:   04/07/20 0912 04/07/20 0915  BP:    Pulse:    Resp:    Temp:    SpO2: (!) 88% 90%   Vitals:   04/07/20 0541 04/07/20 0910 04/07/20 0912 04/07/20 0915  BP: (!) 98/56     Pulse: 75     Resp: (!) 22     Temp: 98.4 F (36.9 C)     TempSrc:      SpO2: 93% 90% (!) 88% 90%  Weight:      Height:        General: Pt is alert, awake, not in acute distress Cardiovascular: RRR, S1/S2 +, no rubs, no gallops Respiratory: Breath sounds slightly decreased bilateral bases, no wheezing, no crackles, normal  respiratory effort, on 2 L nasal cannula with SPO2 90% at rest Abdominal: Soft, NT, ND, bowel sounds + Extremities: no edema, no cyanosis    The results of significant diagnostics from this hospitalization (including imaging, microbiology, ancillary and laboratory) are  listed below for reference.     Microbiology: Recent Results (from the past 240 hour(s))  Blood Culture (routine x 2)     Status: None (Preliminary result)   Collection Time: 04/02/20  5:30 PM   Specimen: BLOOD RIGHT HAND  Result Value Ref Range Status   Specimen Description   Final    BLOOD RIGHT HAND Performed at [facility] [address] [place], [place] [address]    Special Requests   Final    BOTTLES DRAWN AEROBIC ONLY Blood Culture results may not be optimal due to an inadequate volume of blood received in culture bottles Performed at [facility] [address], [place], [place] [address]    Culture   Final    NO GROWTH 4 DAYS Performed at [facility], [place] [address] [place], [place] [address]    Report Status PENDING  Incomplete  SARS Coronavirus 2 by RT PCR (hospital order, performed in [facility] hospital lab) Nasopharyngeal Nasopharyngeal Swab     Status: Abnormal   Collection Time: 04/02/20  5:43 PM   Specimen: Nasopharyngeal Swab  Result Value Ref Range Status   SARS Coronavirus 2 POSITIVE (A) NEGATIVE Final    Comment: RESULT CALLED TO, READ BACK BY AND VERIFIED WITH: TALKINGTON,J. RN @2023  8/30/21BILLINGSLEY,L (NOTE) SARS-CoV-2 target nucleic acids are DETECTED  SARS-CoV-2 RNA is generally detectable in upper respiratory specimens  during the acute phase of infection.  Positive results are indicative  of the presence of the identified virus, but do not rule out bacterial infection or co-infection with other pathogens not detected by the test.  Clinical correlation with patient history and  other diagnostic information is necessary to determine  patient infection status.  The expected result is negative.  Fact Sheet for Patients:   StrictlyIdeas.no   Fact Sheet for Healthcare Providers:   BankingDealers.co.za    This test is not yet approved or cleared by the [place] FDA and  has been authorized for detection and/or diagnosis of SARS-CoV-2 by FDA under an Emergency Use Authorization (EUA).  This EUA will remain in effect (meanin g this test can be used) for the duration of  the COVID-19 declaration under Section 564(b)(1) of the Act, 21 U.S.C. section 360-bbb-3(b)(1), unless the authorization is terminated or revoked sooner.  Performed at [facility], [place] [address] [place], [place] [address]      Labs: BNP (last 3 results) Recent Labs    08/26/19 0322  BNP 45.6   Basic Metabolic Panel: Recent Labs  Lab 04/03/20 0506 04/04/20 0329 04/05/20 0345 04/06/20 0347 04/07/20 0338  NA 138 138 140 143 140  K 3.7 3.6 3.9 4.1 3.5  CL 103 103 104 106 102  CO2 26 25 25 26 29   GLUCOSE 101* 159* 118* 123* 91  BUN 7* 10 11 11 15   CREATININE 0.57 0.51 0.47 0.42* 0.47  CALCIUM 8.1* 8.3* 8.7* 8.6* 8.7*  MG 1.8 2.2 1.9 2.1 2.0  PHOS 3.4 3.3 3.8 4.1 4.2   Liver Function Tests: Recent Labs  Lab 04/03/20 0506 04/04/20 0329 04/05/20 0345 04/06/20 0347 04/07/20 0338  AST 25 21 18 28 24   ALT 17 16 15 21 22   ALKPHOS 41 35* 38 40 41  BILITOT 0.3 0.4 0.3 0.4 0.4  PROT 6.0* 5.9* 6.1* 5.9* 5.7*  ALBUMIN 3.0* 2.9* 2.9* 3.1* 3.1*   No results for input(s): LIPASE, AMYLASE in the last 168 hours. No results for input(s): AMMONIA  in the last 168 hours. CBC: Recent Labs  Lab 04/03/20 0506 04/04/20 0329 04/05/20 0345 04/06/20 0347 04/07/20 0338  WBC 2.5* 1.9* 2.6* 2.5* 4.4  NEUTROABS 1.6* 1.1* 1.7 1.7 2.3  HGB 12.1 12.5 12.3 12.0 13.1  HCT 37.0 37.2 37.4 37.6 40.9  MCV 103.1* 104.8* 104.2* 105.6* 106.2*  PLT 127* 143* 184 186 190   Cardiac  Enzymes: No results for input(s): CKTOTAL, CKMB, CKMBINDEX, TROPONINI in the last 168 hours. BNP: Invalid input(s): POCBNP CBG: Recent Labs  Lab 04/04/20 0832 04/04/20 1148  GLUCAP 92 133*   D-Dimer Recent Labs    04/06/20 0347 04/07/20 0338  DDIMER 0.51* 0.58*   Hgb A1c No results for input(s): HGBA1C in the last 72 hours. Lipid Profile No results for input(s): CHOL, HDL, LDLCALC, TRIG, CHOLHDL, LDLDIRECT in the last 72 hours. Thyroid function studies No results for input(s): TSH, T4TOTAL, T3FREE, THYROIDAB in the last 72 hours.  Invalid input(s): FREET3 Anemia work up No results for input(s): VITAMINB12, FOLATE, FERRITIN, TIBC, IRON, RETICCTPCT in the last 72 hours. Urinalysis    Component Value Date/Time   COLORURINE YELLOW 12/26/2019 1330   APPEARANCEUR CLEAR 12/26/2019 1330   LABSPEC 1.006 12/26/2019 1330   PHURINE 8.0 12/26/2019 1330   GLUCOSEU NEGATIVE 12/26/2019 1330   HGBUR NEGATIVE 12/26/2019 1330   BILIRUBINUR NEGATIVE 12/26/2019 1330   KETONESUR NEGATIVE 12/26/2019 1330   PROTEINUR NEGATIVE 12/26/2019 1330   UROBILINOGEN 0.2 01/17/2015 2350   NITRITE POSITIVE (A) 12/26/2019 1330   LEUKOCYTESUR NEGATIVE 12/26/2019 1330   Sepsis Labs Invalid input(s): PROCALCITONIN,  WBC,  LACTICIDVEN Microbiology Recent Results (from the past 240 hour(s))  Blood Culture (routine x 2)     Status: None (Preliminary result)   Collection Time: 04/02/20  5:30 PM   Specimen: BLOOD RIGHT HAND  Result Value Ref Range Status   Specimen Description   Final    BLOOD RIGHT HAND Performed at [facility], [place] [address] [place], [place] [address]    Special Requests   Final    BOTTLES DRAWN AEROBIC ONLY Blood Culture results may not be optimal due to an inadequate volume of blood received in culture bottles Performed at [facility] [address], [place], [place] [address]    Culture   Final    NO GROWTH 4 DAYS Performed at [facility], [place] [address], [place], [place] [address]    Report Status PENDING  Incomplete  SARS Coronavirus 2 by RT PCR (hospital order, performed in [facility] hospital lab) Nasopharyngeal Nasopharyngeal Swab     Status: Abnormal   Collection Time: 04/02/20  5:43 PM   Specimen: Nasopharyngeal Swab  Result Value Ref Range Status   SARS Coronavirus 2 POSITIVE (A) NEGATIVE Final    Comment: RESULT CALLED TO, READ BACK BY AND VERIFIED WITH: TALKINGTON,J. RN @2023  8/30/21BILLINGSLEY,L (NOTE) SARS-CoV-2 target nucleic acids are DETECTED  SARS-CoV-2 RNA is generally detectable in upper respiratory specimens  during the acute phase of infection.  Positive results are indicative  of the presence of the identified virus, but do not rule out bacterial infection or co-infection with other pathogens not detected by the test.  Clinical correlation with patient history and  other diagnostic information is necessary to determine patient infection status.  The expected result is negative.  Fact Sheet for Patients:   StrictlyIdeas.no   Fact Sheet for Healthcare Providers:   BankingDealers.co.za    This test is not yet approved or cleared by  the [employer] and  has been authorized for detection and/or diagnosis of SARS-CoV-2 by FDA under an Emergency Use Authorization (EUA).  This EUA will remain in effect (meanin g this test can be used) for the duration of  the COVID-19 declaration under Section 564(b)(1) of the Act, 21 U.S.C. section 360-bbb-3(b)(1), unless the authorization is terminated or revoked sooner.  Performed at [facility], [place] [address], [place], [place] [address]      Time coordinating discharge: Over 30 minutes  SIGNED:   Boe Deans J [place] DO  [facility] 04/07/2020, 9:30 AM

## 2020-04-07 NOTE — Progress Notes (Signed)
Pt discharged to home. Discharge instructions and medication education provided to pt. Pt discharged with home oxygen and provided with instructions on how to use.

## 2020-04-07 NOTE — Progress Notes (Signed)
SATURATION QUALIFICATIONS: (This note is used to comply with regulatory documentation for home oxygen)  Patient Saturations on Room Air at Rest = 90%  Patient Saturations on Room Air while Ambulating = 88%  Patient Saturations on 1 Liters of oxygen while Ambulating =92%  Please briefly explain why patient needs home oxygen: pt needs home oxygen due to a decrease in SPO2 while ambulating on room air. Pt's work of breathing increases while ambulating.

## 2020-04-07 NOTE — TOC Progression Note (Signed)
Transition of Care [facility]) - Progression Note    Patient Details  Name: [name] MRN: [id number] Date of Birth: [birth year]  [facility] CM/SW Contact  Purcell Mouton, RN Phone Number: 04/07/2020, 9:44 AM  Clinical Narrative:     Confirmed with in house rep that [employer] has DME and home O2 order. Pt states that her daughter will transport her home. A call to pt's daughter was made, which was unsuccessful. Pt was made aware that her daughter did not answer her phone.      Barriers to Discharge: No Barriers Identified  Expected Discharge Plan and Services           Expected Discharge Date: 04/07/20                                     Social Determinants of Health (SDOH) Interventions    Readmission Risk Interventions No flowsheet data found.

## 2020-04-07 NOTE — Discharge Instructions (Signed)
COVID-19: How to Protect Yourself and Others Know how it spreads  There is currently no vaccine to prevent coronavirus disease 2019 (COVID-19).  The best way to prevent illness is to avoid being exposed to this virus.  The virus is thought to spread mainly from person-to-person. ? Between people who are in close contact with one another (within about 6 feet). ? Through respiratory droplets produced when an infected person coughs, sneezes or talks. ? These droplets can land in the mouths or noses of people who are nearby or possibly be inhaled into the lungs. ? COVID-19 may be spread by people who are not showing symptoms. Everyone should Clean your hands often  Wash your hands often with soap and water for at least 20 seconds especially after you have been in a public place, or after blowing your nose, coughing, or sneezing.  If soap and water are not readily available, use a hand sanitizer that contains at least 60% alcohol. Cover all surfaces of your hands and rub them together until they feel dry.  Avoid touching your eyes, nose, and mouth with unwashed hands. Avoid close contact  Limit contact with others as much as possible.  Avoid close contact with people who are sick.  Put distance between yourself and other people. ? Remember that some people without symptoms may be able to spread virus. ? This is especially important for people who are at higher risk of getting very GainPain.com.cy Cover your mouth and nose with a mask when around others  You could spread COVID-19 to others even if you do not feel sick.  Everyone should wear a mask in public settings and when around people not living in their household, especially when social distancing is difficult to maintain. ? Masks should not be placed on young children under age 37, anyone who has trouble breathing, or is unconscious, incapacitated or otherwise  unable to remove the mask without assistance.  The mask is meant to protect other people in case you are infected.  Do NOT use a facemask meant for a Dietitian.  Continue to keep about 6 feet between yourself and others. The mask is not a substitute for social distancing. Cover coughs and sneezes  Always cover your mouth and nose with a tissue when you cough or sneeze or use the inside of your elbow.  Throw used tissues in the trash.  Immediately wash your hands with soap and water for at least 20 seconds. If soap and water are not readily available, clean your hands with a hand sanitizer that contains at least 60% alcohol. Clean and disinfect  Clean AND disinfect frequently touched surfaces daily. This includes tables, doorknobs, light switches, countertops, handles, desks, phones, keyboards, toilets, faucets, and sinks. [email or web address]  If surfaces are dirty, clean them: Use detergent or soap and water prior to disinfection.  Then, use a household disinfectant. You can see a list of EPA-registered household disinfectants here. [email or web address] 04/06/2019 This information is not intended to replace advice given to you by your health care provider. Make sure you discuss any questions you have with your health care provider. Document Revised: 04/14/2019 Document Reviewed: 02/10/2019 Elsevier Patient Education  [place] Can Do to Manage Your COVID-19 Symptoms at Home If you have possible or confirmed COVID-19: 1. Stay home from work and school. And stay away from other public places. If you must go out, avoid using any kind of public transportation, ridesharing, or taxis. 2. Monitor your symptoms  carefully. If your symptoms get worse, call your healthcare provider immediately. 3. Get rest and stay hydrated. 4. If you have a medical appointment, call the healthcare provider ahead of  time and tell them that you have or may have COVID-19. 5. For medical emergencies, call 911 and notify the dispatch personnel that you have or may have COVID-19. 6. Cover your cough and sneezes with a tissue or use the inside of your elbow. 7. Wash your hands often with soap and water for at least 20 seconds or clean your hands with an alcohol-based hand sanitizer that contains at least 60% alcohol. 8. As much as possible, stay in a specific room and away from other people in your home. Also, you should use a separate bathroom, if available. If you need to be around other people in or outside of the home, wear a mask. 9. Avoid sharing personal items with other people in your household, like dishes, towels, and bedding. 10. Clean all surfaces that are touched often, like counters, tabletops, and doorknobs. Use household cleaning sprays or wipes according to the label instructions. [email or web address] 02/02/2019 This information is not intended to replace advice given to you by your health care provider. Make sure you discuss any questions you have with your health care provider. Document Revised: 07/07/2019 Document Reviewed: 07/07/2019 Elsevier Patient Education  [place]: Quarantine vs. Isolation QUARANTINE keeps someone who was in close contact with someone who has COVID-19 away from others. If you had close contact with a person who has COVID-19  Stay home until 14 days after your last contact.  Check your temperature twice a day and watch for symptoms of COVID-19.  If possible, stay away from people who are at higher-risk for getting very sick from COVID-19. ISOLATION keeps someone who is sick or tested positive for COVID-19 without symptoms away from others, even in their own home. If you are sick and think or know you have COVID-19  Stay home until after ? At least 10 days since symptoms first appeared and ? At least 24 hours with no fever without fever-reducing  medication and ? Symptoms have improved If you tested positive for COVID-19 but do not have symptoms  Stay home until after ? 10 days have passed since your positive test If you live with others, stay in a specific "sick room" or area and away from other people or animals, including pets. Use a separate bathroom, if available. [email or web address] 02/21/2019 This information is not intended to replace advice given to you by your health care provider. Make sure you discuss any questions you have with your health care provider. Document Revised: 07/07/2019 Document Reviewed: 07/07/2019 Elsevier Patient Education  2020 [place].   COVID-19 COVID-19 is a respiratory infection that is caused by a virus called severe acute respiratory syndrome coronavirus 2 (SARS-CoV-2). The disease is also known as coronavirus disease or novel coronavirus. In some people, the virus may not cause any symptoms. In others, it may cause a serious infection. The infection can get worse quickly and can lead to complications, such as:  Pneumonia, or infection of the lungs.  Acute respiratory distress syndrome or ARDS. This is a condition in which fluid build-up in the lungs prevents the lungs from filling with air and passing oxygen into the blood.  Acute respiratory failure. This is a condition in which there is not enough oxygen passing from the lungs to the body or when carbon dioxide is not passing from the lungs  out of the body.  Sepsis or septic shock. This is a serious bodily reaction to an infection.  Blood clotting problems.  Secondary infections due to bacteria or fungus.  Organ failure. This is when your body's organs stop working. The virus that causes COVID-19 is contagious. This means that it can spread from person to person through droplets from coughs and sneezes (respiratory secretions). What are the causes? This illness is caused by a virus. You may catch the virus by:  Breathing in  droplets from an infected person. Droplets can be spread by a person breathing, speaking, singing, coughing, or sneezing.  Touching something, like a table or a doorknob, that was exposed to the virus (contaminated) and then touching your mouth, nose, or eyes. What increases the risk? Risk for infection You are more likely to be infected with this virus if you:  Are within 6 feet (2 meters) of a person with COVID-19.  Provide care for or live with a person who is infected with COVID-19.  Spend time in crowded indoor spaces or live in shared housing. Risk for serious illness You are more likely to become seriously ill from the virus if you:  Are 26 years of age or older. The higher your age, the more you are at risk for serious illness.  Live in a nursing home or long-term care facility.  Have cancer.  Have a long-term (chronic) disease such as: ? Chronic lung disease, including chronic obstructive pulmonary disease or asthma. ? A long-term disease that lowers your body's ability to fight infection (immunocompromised). ? Heart disease, including heart failure, a condition in which the arteries that lead to the heart become narrow or blocked (coronary artery disease), a disease which makes the heart muscle thick, weak, or stiff (cardiomyopathy). ? Diabetes. ? Chronic kidney disease. ? Sickle cell disease, a condition in which red blood cells have an abnormal "sickle" shape. ? Liver disease.  Are obese. What are the signs or symptoms? Symptoms of this condition can range from mild to severe. Symptoms may appear any time from 2 to 14 days after being exposed to the virus. They include:  A fever or chills.  A cough.  Difficulty breathing.  Headaches, body aches, or muscle aches.  Runny or stuffy (congested) nose.  A sore throat.  New loss of taste or smell. Some people may also have stomach problems, such as nausea, vomiting, or diarrhea. Other people may not have any  symptoms of COVID-19. How is this diagnosed? This condition may be diagnosed based on:  Your signs and symptoms, especially if: ? You live in an area with a COVID-19 outbreak. ? You recently traveled to or from an area where the virus is common. ? You provide care for or live with a person who was diagnosed with COVID-19. ? You were exposed to a person who was diagnosed with COVID-19.  A physical exam.  Lab tests, which may include: ? Taking a sample of fluid from the back of your nose and throat (nasopharyngeal fluid), your nose, or your throat using a swab. ? A sample of mucus from your lungs (sputum). ? Blood tests.  Imaging tests, which may include, X-rays, CT scan, or ultrasound. How is this treated? At present, there is no medicine to treat COVID-19. Medicines that treat other diseases are being used on a trial basis to see if they are effective against COVID-19. Your health care provider will talk with you about ways to treat your symptoms. For most  people, the infection is mild and can be managed at home with rest, fluids, and over-the-counter medicines. Treatment for a serious infection usually takes places in a hospital intensive care unit (ICU). It may include one or more of the following treatments. These treatments are given until your symptoms improve.  Receiving fluids and medicines through an IV.  Supplemental oxygen. Extra oxygen is given through a tube in the nose, a face mask, or a hood.  Positioning you to lie on your stomach (prone position). This makes it easier for oxygen to get into the lungs.  Continuous positive airway pressure (CPAP) or bi-level positive airway pressure (BPAP) machine. This treatment uses mild air pressure to keep the airways open. A tube that is connected to a motor delivers oxygen to the body.  Ventilator. This treatment moves air into and out of the lungs by using a tube that is placed in your windpipe.  Tracheostomy. This is a procedure  to create a hole in the neck so that a breathing tube can be inserted.  Extracorporeal membrane oxygenation (ECMO). This procedure gives the lungs a chance to recover by taking over the functions of the heart and lungs. It supplies oxygen to the body and removes carbon dioxide. Follow these instructions at home: Lifestyle  If you are sick, stay home except to get medical care. Your health care provider will tell you how long to stay home. Call your health care provider before you go for medical care.  Rest at home as told by your health care provider.  Do not use any products that contain nicotine or tobacco, such as cigarettes, e-cigarettes, and chewing tobacco. If you need help quitting, ask your health care provider.  Return to your normal activities as told by your health care provider. Ask your health care provider what activities are safe for you. General instructions  Take over-the-counter and prescription medicines only as told by your health care provider.  Drink enough fluid to keep your urine pale yellow.  Keep all follow-up visits as told by your health care provider. This is important. How is this prevented?  There is no vaccine to help prevent COVID-19 infection. However, there are steps you can take to protect yourself and others from this virus. To protect yourself:   Do not travel to areas where COVID-19 is a risk. The areas where COVID-19 is reported change often. To identify high-risk areas and travel restrictions, check the CDC travel website: FatFares.com.br  If you live in, or must travel to, an area where COVID-19 is a risk, take precautions to avoid infection. ? Stay away from people who are sick. ? Wash your hands often with soap and water for 20 seconds. If soap and water are not available, use an alcohol-based hand sanitizer. ? Avoid touching your mouth, face, eyes, or nose. ? Avoid going out in public, follow guidance from your state and local  health authorities. ? If you must go out in public, wear a cloth face covering or face mask. Make sure your mask covers your nose and mouth. ? Avoid crowded indoor spaces. Stay at least 6 feet (2 meters) away from others. ? Disinfect objects and surfaces that are frequently touched every day. This may include:  Counters and tables.  Doorknobs and light switches.  Sinks and faucets.  Electronics, such as phones, remote controls, keyboards, computers, and tablets. To protect others: If you have symptoms of COVID-19, take steps to prevent the virus from spreading to others.  If  you think you have a COVID-19 infection, contact your health care provider right away. Tell your health care team that you think you may have a COVID-19 infection.  Stay home. Leave your house only to seek medical care. Do not use public transport.  Do not travel while you are sick.  Wash your hands often with soap and water for 20 seconds. If soap and water are not available, use alcohol-based hand sanitizer.  Stay away from other members of your household. Let healthy household members care for children and pets, if possible. If you have to care for children or pets, wash your hands often and wear a mask. If possible, stay in your own room, separate from others. Use a different bathroom.  Make sure that all people in your household wash their hands well and often.  Cough or sneeze into a tissue or your sleeve or elbow. Do not cough or sneeze into your hand or into the air.  Wear a cloth face covering or face mask. Make sure your mask covers your nose and mouth. Where to find more information  Centers for Disease Control and Prevention: PurpleGadgets.be  World Health Organization: https://www.castaneda.info/ Contact a health care provider if:  You live in or have traveled to an area where COVID-19 is a risk and you have symptoms of the infection.  You have had contact  with someone who has COVID-19 and you have symptoms of the infection. Get help right away if:  You have trouble breathing.  You have pain or pressure in your chest.  You have confusion.  You have bluish lips and fingernails.  You have difficulty waking from sleep.  You have symptoms that get worse. These symptoms may represent a serious problem that is an emergency. Do not wait to see if the symptoms will go away. Get medical help right away. Call your local emergency services (911 in the [place] Do not drive yourself to the hospital. Let the emergency medical personnel know if you think you have COVID-19. Summary  COVID-19 is a respiratory infection that is caused by a virus. It is also known as coronavirus disease or novel coronavirus. It can cause serious infections, such as pneumonia, acute respiratory distress syndrome, acute respiratory failure, or sepsis.  The virus that causes COVID-19 is contagious. This means that it can spread from person to person through droplets from breathing, speaking, singing, coughing, or sneezing.  You are more likely to develop a serious illness if you are 78 years of age or older, have a weak immune system, live in a nursing home, or have chronic disease.  There is no medicine to treat COVID-19. Your health care provider will talk with you about ways to treat your symptoms.  Take steps to protect yourself and others from infection. Wash your hands often and disinfect objects and surfaces that are frequently touched every day. Stay away from people who are sick and wear a mask if you are sick. This information is not intended to replace advice given to you by your health care provider. Make sure you discuss any questions you have with your health care provider. Document Revised: 05/20/2019 Document Reviewed: 08/26/2018 Elsevier Patient Education  2020 [place] Under Monitoring Name: [name]  Location: [address] [place] [facility] [id number]   Infection Prevention Recommendations for Individuals Confirmed to have, or Being Evaluated for, 2019 Novel Coronavirus (COVID-19) Infection Who Receive Care at Home  Individuals who are confirmed to have,  or are being evaluated for, COVID-19 should follow the prevention steps below until a healthcare provider or local or state health department says they can return to normal activities.  Stay home except to get medical care You should restrict activities outside your home, except for getting medical care. Do not go to work, school, or public areas, and do not use public transportation or taxis.  Call ahead before visiting your doctor Before your medical appointment, call the healthcare provider and tell them that you have, or are being evaluated for, COVID-19 infection. This will help the healthcare provider's office take steps to keep other people from getting infected. Ask your healthcare provider to call the local or state health department.  Monitor your symptoms Seek prompt medical attention if your illness is worsening (e.g., difficulty breathing). Before going to your medical appointment, call the healthcare provider and tell them that you have, or are being evaluated for, COVID-19 infection. Ask your healthcare provider to call the local or state health department.  Wear a facemask You should wear a facemask that covers your nose and mouth when you are in the same room with other people and when you visit a healthcare provider. People who live with or visit you should also wear a facemask while they are in the same room with you.  Separate yourself from other people in your home As much as possible, you should stay in a different room from other people in your home. Also, you should use a separate bathroom, if available.  Avoid sharing household items You should not share dishes, drinking glasses, cups, eating utensils, towels, bedding, or  other items with other people in your home. After using these items, you should wash them thoroughly with soap and water.  Cover your coughs and sneezes Cover your mouth and nose with a tissue when you cough or sneeze, or you can cough or sneeze into your sleeve. Throw used tissues in a lined trash can, and immediately wash your hands with soap and water for at least 20 seconds or use an alcohol-based hand rub.  Wash your [facility] your hands often and thoroughly with soap and water for at least 20 seconds. You can use an alcohol-based hand sanitizer if soap and water are not available and if your hands are not visibly dirty. Avoid touching your eyes, nose, and mouth with unwashed hands.   Prevention Steps for Caregivers and Household Members of Individuals Confirmed to have, or Being Evaluated for, COVID-19 Infection Being Cared for in the Home  If you live with, or provide care at home for, a person confirmed to have, or being evaluated for, COVID-19 infection please follow these guidelines to prevent infection:  Follow healthcare provider's instructions Make sure that you understand and can help the patient follow any healthcare provider instructions for all care.  Provide for the patient's basic needs You should help the patient with basic needs in the home and provide support for getting groceries, prescriptions, and other personal needs.  Monitor the patient's symptoms If they are getting sicker, call his or her medical provider and tell them that the patient has, or is being evaluated for, COVID-19 infection. This will help the healthcare provider's office take steps to keep other people from getting infected. Ask the healthcare provider to call the local or state health department.  Limit the number of people who have contact with the patient  If possible, have only one caregiver for the patient.  Other household members should  stay in another home or place of residence.  If this is not possible, they should stay  in another room, or be separated from the patient as much as possible. Use a separate bathroom, if available.  Restrict visitors who do not have an essential need to be in the home.  Keep older adults, very young children, and other sick people away from the patient Keep older adults, very young children, and those who have compromised immune systems or chronic health conditions away from the patient. This includes people with chronic heart, lung, or kidney conditions, diabetes, and cancer.  Ensure good ventilation Make sure that shared spaces in the home have good air flow, such as from an air conditioner or an opened window, weather permitting.  Wash your hands often  Wash your hands often and thoroughly with soap and water for at least 20 seconds. You can use an alcohol based hand sanitizer if soap and water are not available and if your hands are not visibly dirty.  Avoid touching your eyes, nose, and mouth with unwashed hands.  Use disposable paper towels to dry your hands. If not available, use dedicated cloth towels and replace them when they become wet.  Wear a facemask and gloves  Wear a disposable facemask at all times in the room and gloves when you touch or have contact with the patient's blood, body fluids, and/or secretions or excretions, such as sweat, saliva, sputum, nasal mucus, vomit, urine, or feces.  Ensure the mask fits over your nose and mouth tightly, and do not touch it during use.  Throw out disposable facemasks and gloves after using them. Do not reuse.  Wash your hands immediately after removing your facemask and gloves.  If your personal clothing becomes contaminated, carefully remove clothing and launder. Wash your hands after handling contaminated clothing.  Place all used disposable facemasks, gloves, and other waste in a lined container before disposing them with other household waste.  Remove gloves and wash  your hands immediately after handling these items.  Do not share dishes, glasses, or other household items with the patient  Avoid sharing household items. You should not share dishes, drinking glasses, cups, eating utensils, towels, bedding, or other items with a patient who is confirmed to have, or being evaluated for, COVID-19 infection.  After the person uses these items, you should wash them thoroughly with soap and water.  Wash laundry thoroughly  Immediately remove and wash clothes or bedding that have blood, body fluids, and/or secretions or excretions, such as sweat, saliva, sputum, nasal mucus, vomit, urine, or feces, on them.  Wear gloves when handling laundry from the patient.  Read and follow directions on labels of laundry or clothing items and detergent. In general, wash and dry with the warmest temperatures recommended on the label.  Clean all areas the individual has used often  Clean all touchable surfaces, such as counters, tabletops, doorknobs, bathroom fixtures, toilets, phones, keyboards, tablets, and bedside tables, every day. Also, clean any surfaces that may have blood, body fluids, and/or secretions or excretions on them.  Wear gloves when cleaning surfaces the patient has come in contact with.  Use a diluted bleach solution (e.g., dilute bleach with 1 part bleach and 10 parts water) or a household disinfectant with a label that says EPA-registered for coronaviruses. To make a bleach solution at home, add 1 tablespoon of bleach to 1 quart (4 cups) of water. For a larger supply, add  cup of bleach to 1 gallon (  16 cups) of water.  Read labels of cleaning products and follow recommendations provided on product labels. Labels contain instructions for safe and effective use of the cleaning product including precautions you should take when applying the product, such as wearing gloves or eye protection and making sure you have good ventilation during use of the  product.  Remove gloves and wash hands immediately after cleaning.  Monitor yourself for signs and symptoms of illness Caregivers and household members are considered close contacts, should monitor their health, and will be asked to limit movement outside of the home to the extent possible. Follow the monitoring steps for close contacts listed on the symptom monitoring form.   ? If you have additional questions, contact your local health department or call the epidemiologist on call at [phone] (available 24/7). ? This guidance is subject to change. For the most up-to-date guidance from [facility], please refer to their website: YouBlogs.pl

## 2020-04-10 DIAGNOSIS — Z1159 Encounter for screening for other viral diseases: Secondary | ICD-10-CM | POA: Diagnosis not present

## 2020-04-10 DIAGNOSIS — R599 Enlarged lymph nodes, unspecified: Secondary | ICD-10-CM | POA: Diagnosis not present

## 2020-04-12 DIAGNOSIS — R29898 Other symptoms and signs involving the musculoskeletal system: Secondary | ICD-10-CM | POA: Diagnosis not present

## 2020-04-12 DIAGNOSIS — J449 Chronic obstructive pulmonary disease, unspecified: Secondary | ICD-10-CM | POA: Diagnosis not present

## 2020-04-16 DIAGNOSIS — R569 Unspecified convulsions: Secondary | ICD-10-CM | POA: Diagnosis not present

## 2020-04-24 ENCOUNTER — Emergency Department (HOSPITAL_COMMUNITY): Payer: Medicare Other

## 2020-04-24 ENCOUNTER — Other Ambulatory Visit: Payer: Self-pay

## 2020-04-24 ENCOUNTER — Emergency Department (HOSPITAL_COMMUNITY)
Admission: EM | Admit: 2020-04-24 | Discharge: 2020-04-24 | Disposition: A | Discharge status: Home / Self Care | Payer: Medicare Other | Attending: Emergency Medicine | Admitting: Emergency Medicine

## 2020-04-24 ENCOUNTER — Encounter (HOSPITAL_COMMUNITY): Payer: Self-pay | Admitting: Emergency Medicine

## 2020-04-24 DIAGNOSIS — Z5321 Procedure and treatment not carried out due to patient leaving prior to being seen by health care provider: Secondary | ICD-10-CM | POA: Insufficient documentation

## 2020-04-24 DIAGNOSIS — R531 Weakness: Secondary | ICD-10-CM | POA: Insufficient documentation

## 2020-04-24 DIAGNOSIS — W19XXXA Unspecified fall, initial encounter: Secondary | ICD-10-CM | POA: Insufficient documentation

## 2020-04-24 DIAGNOSIS — G459 Transient cerebral ischemic attack, unspecified: Secondary | ICD-10-CM | POA: Diagnosis not present

## 2020-04-24 DIAGNOSIS — M545 Low back pain: Secondary | ICD-10-CM | POA: Diagnosis not present

## 2020-04-24 DIAGNOSIS — S199XXA Unspecified injury of neck, initial encounter: Secondary | ICD-10-CM | POA: Diagnosis not present

## 2020-04-24 DIAGNOSIS — R569 Unspecified convulsions: Secondary | ICD-10-CM | POA: Insufficient documentation

## 2020-04-24 DIAGNOSIS — R0902 Hypoxemia: Secondary | ICD-10-CM | POA: Diagnosis not present

## 2020-04-24 DIAGNOSIS — I959 Hypotension, unspecified: Secondary | ICD-10-CM | POA: Diagnosis not present

## 2020-04-24 LAB — CBC WITH DIFFERENTIAL/PLATELET
Abs Immature Granulocytes: 0.02 10*3/uL (ref 0.00–0.07)
Basophils Absolute: 0 10*3/uL (ref 0.0–0.1)
Basophils Relative: 0 %
Eosinophils Absolute: 0.1 10*3/uL (ref 0.0–0.5)
Eosinophils Relative: 1 %
HCT: 40.8 % (ref 36.0–46.0)
Hemoglobin: 12.9 g/dL (ref 12.0–15.0)
Immature Granulocytes: 0 %
Lymphocytes Relative: 23 %
Lymphs Abs: 1.2 10*3/uL (ref 0.7–4.0)
MCH: 34 pg (ref 26.0–34.0)
MCHC: 31.6 g/dL (ref 30.0–36.0)
MCV: 107.7 fL — ABNORMAL HIGH (ref 80.0–100.0)
Monocytes Absolute: 0.4 10*3/uL (ref 0.1–1.0)
Monocytes Relative: 7 %
Neutro Abs: 3.3 10*3/uL (ref 1.7–7.7)
Neutrophils Relative %: 69 %
Platelets: 162 10*3/uL (ref 150–400)
RBC: 3.79 MIL/uL — ABNORMAL LOW (ref 3.87–5.11)
RDW: 13.7 % (ref 11.5–15.5)
WBC: 5 10*3/uL (ref 4.0–10.5)
nRBC: 0 % (ref 0.0–0.2)

## 2020-04-24 LAB — APTT: aPTT: 29 seconds (ref 24–36)

## 2020-04-24 LAB — COMPREHENSIVE METABOLIC PANEL
ALT: 14 U/L (ref 0–44)
AST: 16 U/L (ref 15–41)
Albumin: 3.6 g/dL (ref 3.5–5.0)
Alkaline Phosphatase: 46 U/L (ref 38–126)
Anion gap: 10 (ref 5–15)
BUN: 11 mg/dL (ref 8–23)
CO2: 27 mmol/L (ref 22–32)
Calcium: 9.2 mg/dL (ref 8.9–10.3)
Chloride: 106 mmol/L (ref 98–111)
Creatinine, Ser: 0.7 mg/dL (ref 0.44–1.00)
GFR calc Af Amer: >60 mL/min (ref >60)
GFR calc non Af Amer: >60 mL/min (ref >60)
Glucose, Bld: 95 mg/dL (ref 70–99)
Potassium: 3.7 mmol/L (ref 3.5–5.1)
Sodium: 143 mmol/L (ref 135–145)
Total Bilirubin: 0.4 mg/dL (ref 0.3–1.2)
Total Protein: 6.3 g/dL — ABNORMAL LOW (ref 6.5–8.1)

## 2020-04-24 LAB — PROTIME-INR
INR: 1 (ref 0.8–1.2)
Prothrombin Time: 12.3 seconds (ref 11.4–15.2)

## 2020-04-24 LAB — I-STAT CHEM 8, ED
BUN: 13 mg/dL (ref 8–23)
Calcium, Ion: 1.23 mmol/L (ref 1.15–1.40)
Chloride: 105 mmol/L (ref 98–111)
Creatinine, Ser: 0.7 mg/dL (ref 0.44–1.00)
Glucose, Bld: 94 mg/dL (ref 70–99)
HCT: 40 % (ref 36.0–46.0)
Hemoglobin: 13.6 g/dL (ref 12.0–15.0)
Potassium: 3.7 mmol/L (ref 3.5–5.1)
Sodium: 143 mmol/L (ref 135–145)
TCO2: 28 mmol/L (ref 22–32)

## 2020-04-24 LAB — PHENYTOIN LEVEL, TOTAL: Phenytoin Lvl: 15.9 ug/mL (ref 10.0–20.0)

## 2020-04-24 MED ORDER — SODIUM CHLORIDE 0.9% FLUSH: 3.0000 mL | Freq: Once | INTRAVENOUS | Status: DC

## 2020-04-24 NOTE — ED Triage Notes (Signed)
Arrived via EMS patient increased dose of Dilantin and patient stated woke up needing to go to the bathroom. On the way had a fall hit her head and had a seizure. After patient had bilateral lower extremity weakness and had a fall lowered to the ground by family. 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 EMS placed C-collar prior to arrival.

## 2020-04-28 IMAGING — CT CT HEAD W/O CM
3 series · 14 of 47 positions shown, 16 images · non-contrast
Comparison: Brain MR dated 12/19/2017. Head CT dated 12/19/2017.

CLINICAL DATA: 5 seizures today. History of seizures. Transient
left-sided numbness following the seizures.

EXAM:
CT HEAD WITHOUT CONTRAST
TECHNIQUE: Contiguous axial images were obtained from the base of the skull
through the vertex without intravenous contrast.

[Series 3: head 5.0 h30s · axial · 0.43mm/px · z∈[-104,+36]mm · 8 of 34 slices shown, 10 images]
[im 3/34  brain]
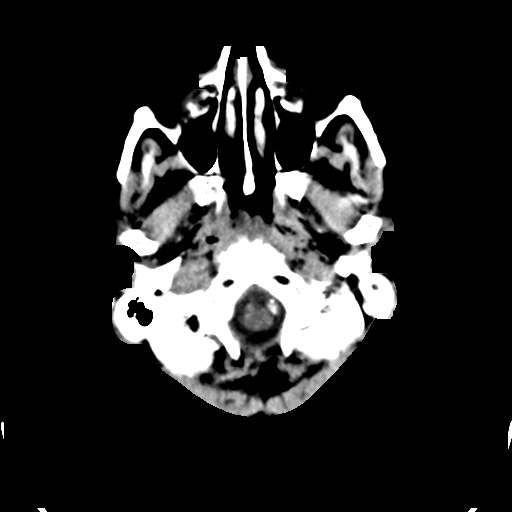
[im 3/34  bone]
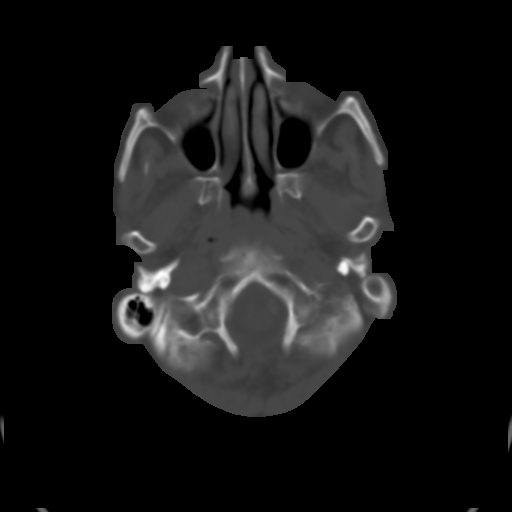
[im 7/34  brain]
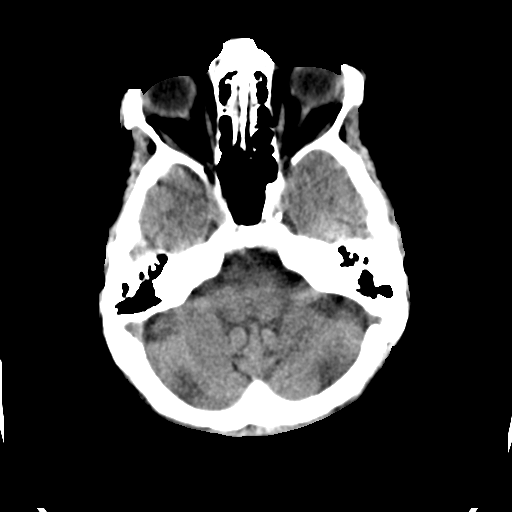
[im 11/34  brain]
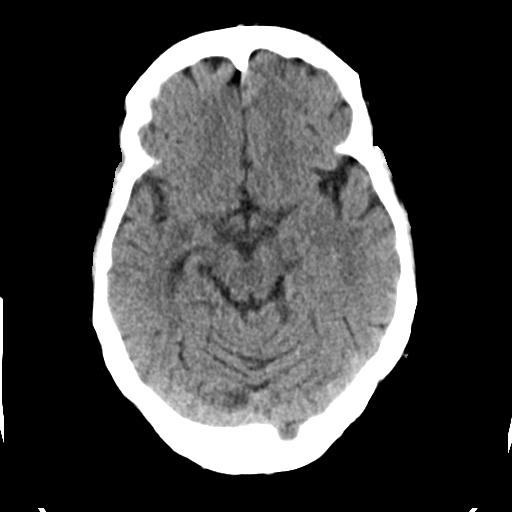
[im 15/34  brain]
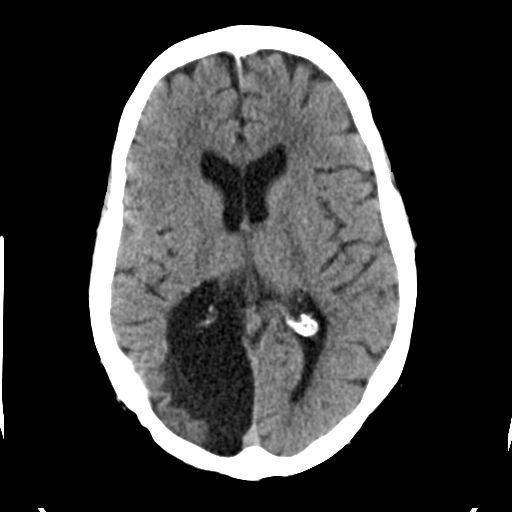
[im 19/34  brain]
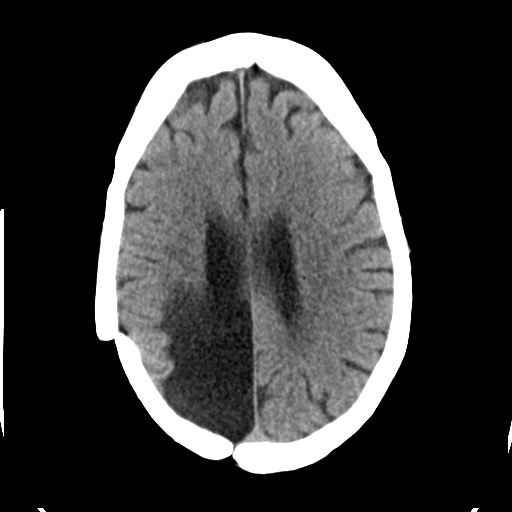
[im 19/34  bone]
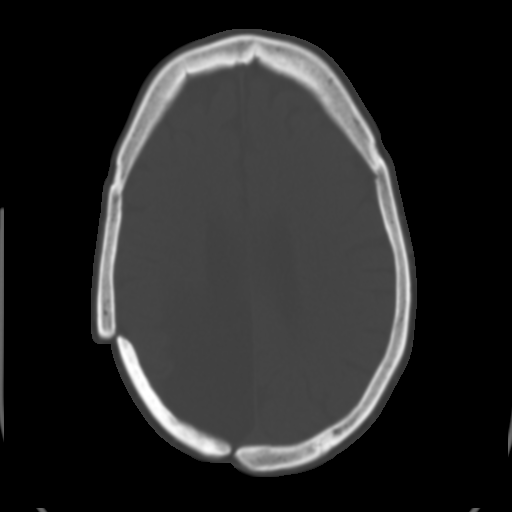
[im 23/34  brain]
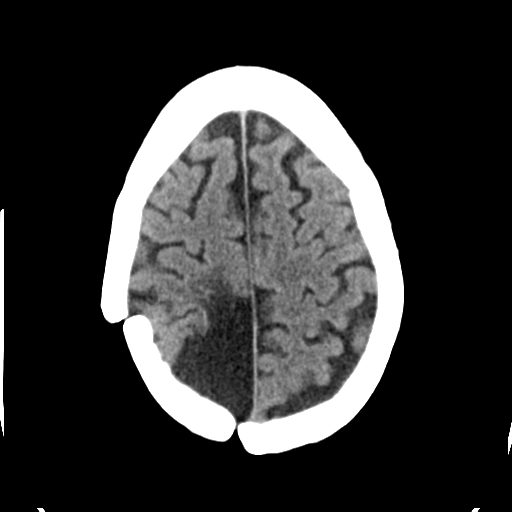
[im 27/34  brain]
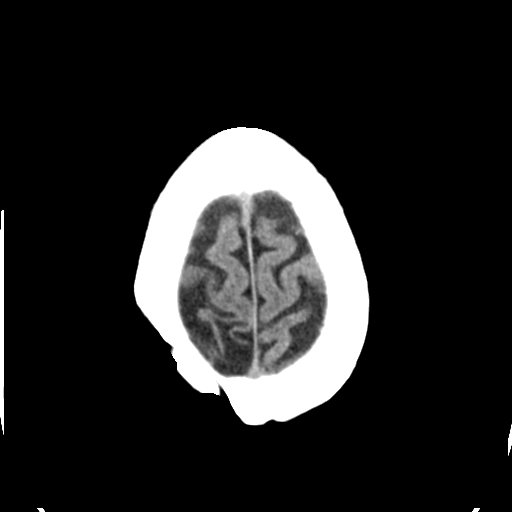
[im 31/34  brain]
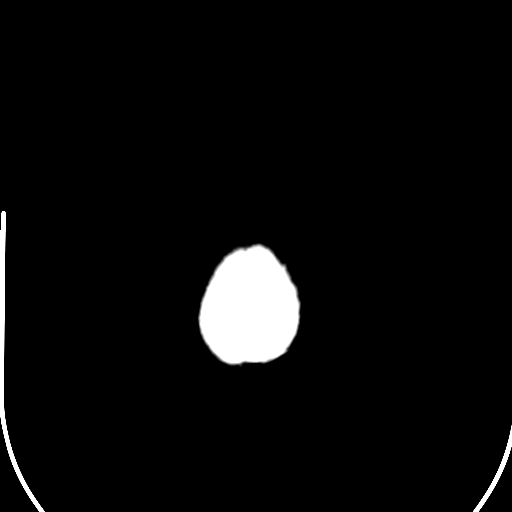

[Series 5: head 3.0 mpr cor · coronal · 0.33mm/px · 3 of 74 slices shown]
[im 25/74  brain]
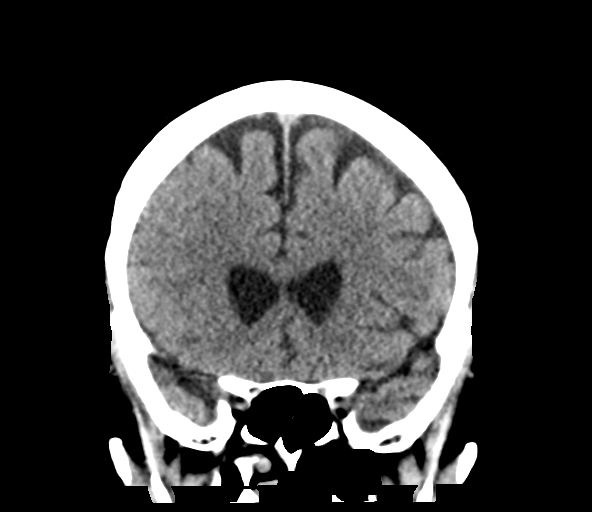
[im 33/74  brain]
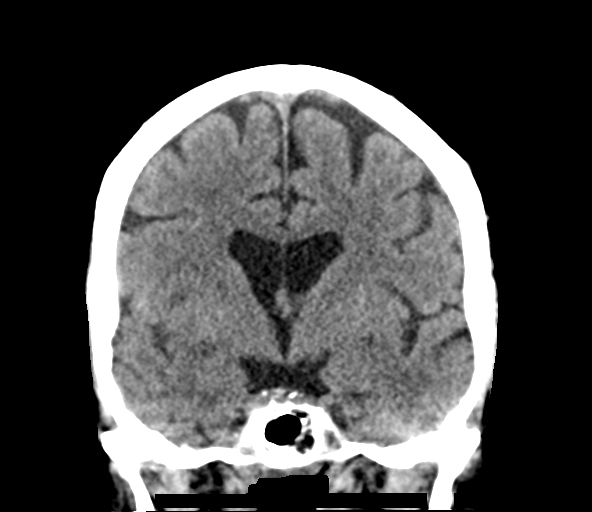
[im 41/74  brain]
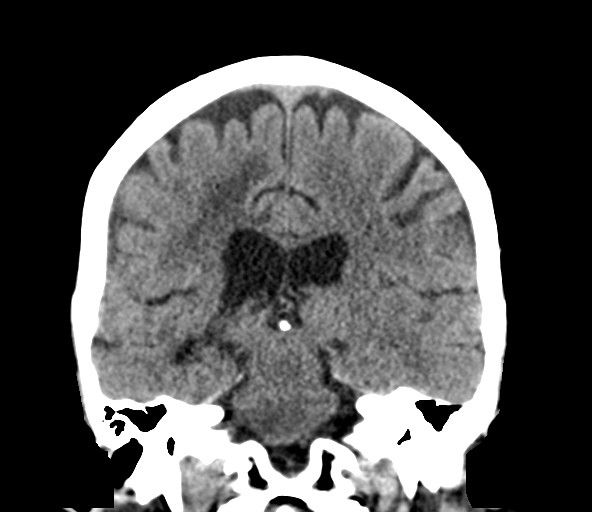

[Series 6: head 3.0 mpr sag · sagittal · 0.33mm/px · 3 of 67 slices shown]
[im 23/67  brain]
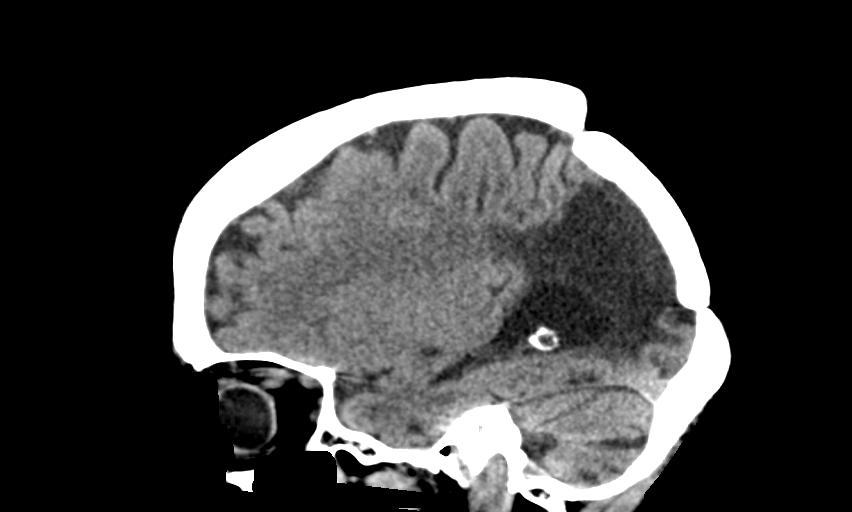
[im 34/67  brain]
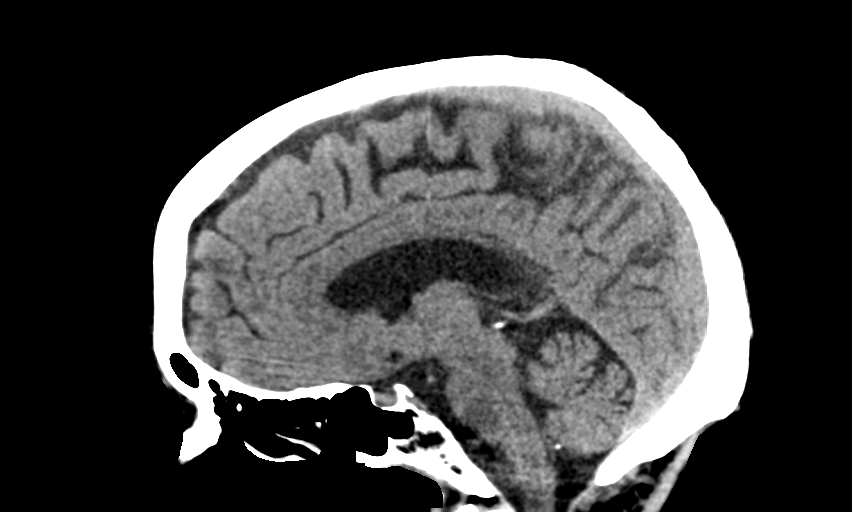
[im 45/67  brain]
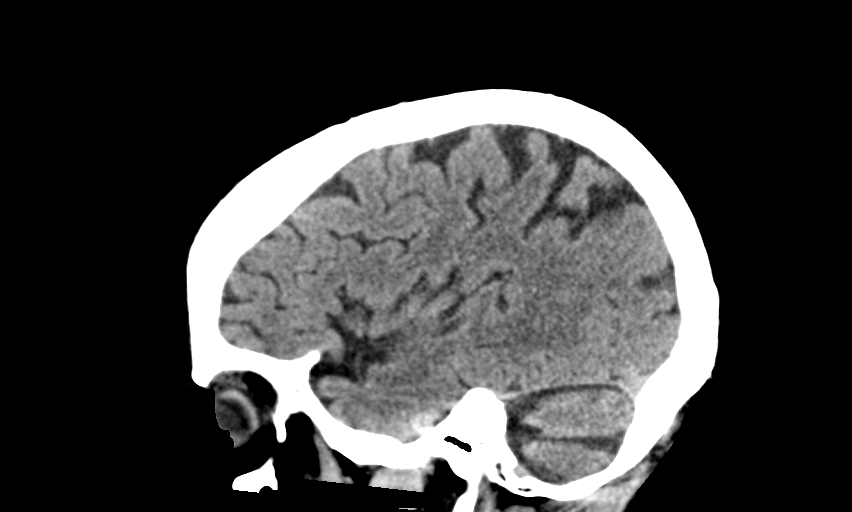

[14 of 47 positions shown; findings below may reference images not displayed]

FINDINGS: Brain: Stable large area of encephalomalacia and porencephaly in the
right parietal and occipital lobes. Stable mildly enlarged
ventricles and cortical sulci. No intracranial hemorrhage, mass
lesion or CT evidence of acute infarction.

Vascular: No hyperdense vessel or unexpected calcification.

Skull: Stable right posterior craniotomy changes.

Sinuses/Orbits: Unremarkable.

Other: None.
IMPRESSION: 1. No acute abnormality.
2. Stable large area of encephalomalacia and porencephaly in the
right parietal and occipital lobes.
3. Stable mild diffuse cerebral and cerebellar atrophy.

## 2020-04-28 IMAGING — DX DG CHEST 2V
2 series · 2 of 2 positions shown · non-contrast
Comparison: August 05, 2017

CLINICAL DATA: Seizures

EXAM:
CHEST - 2 VIEW

[w chest lat]
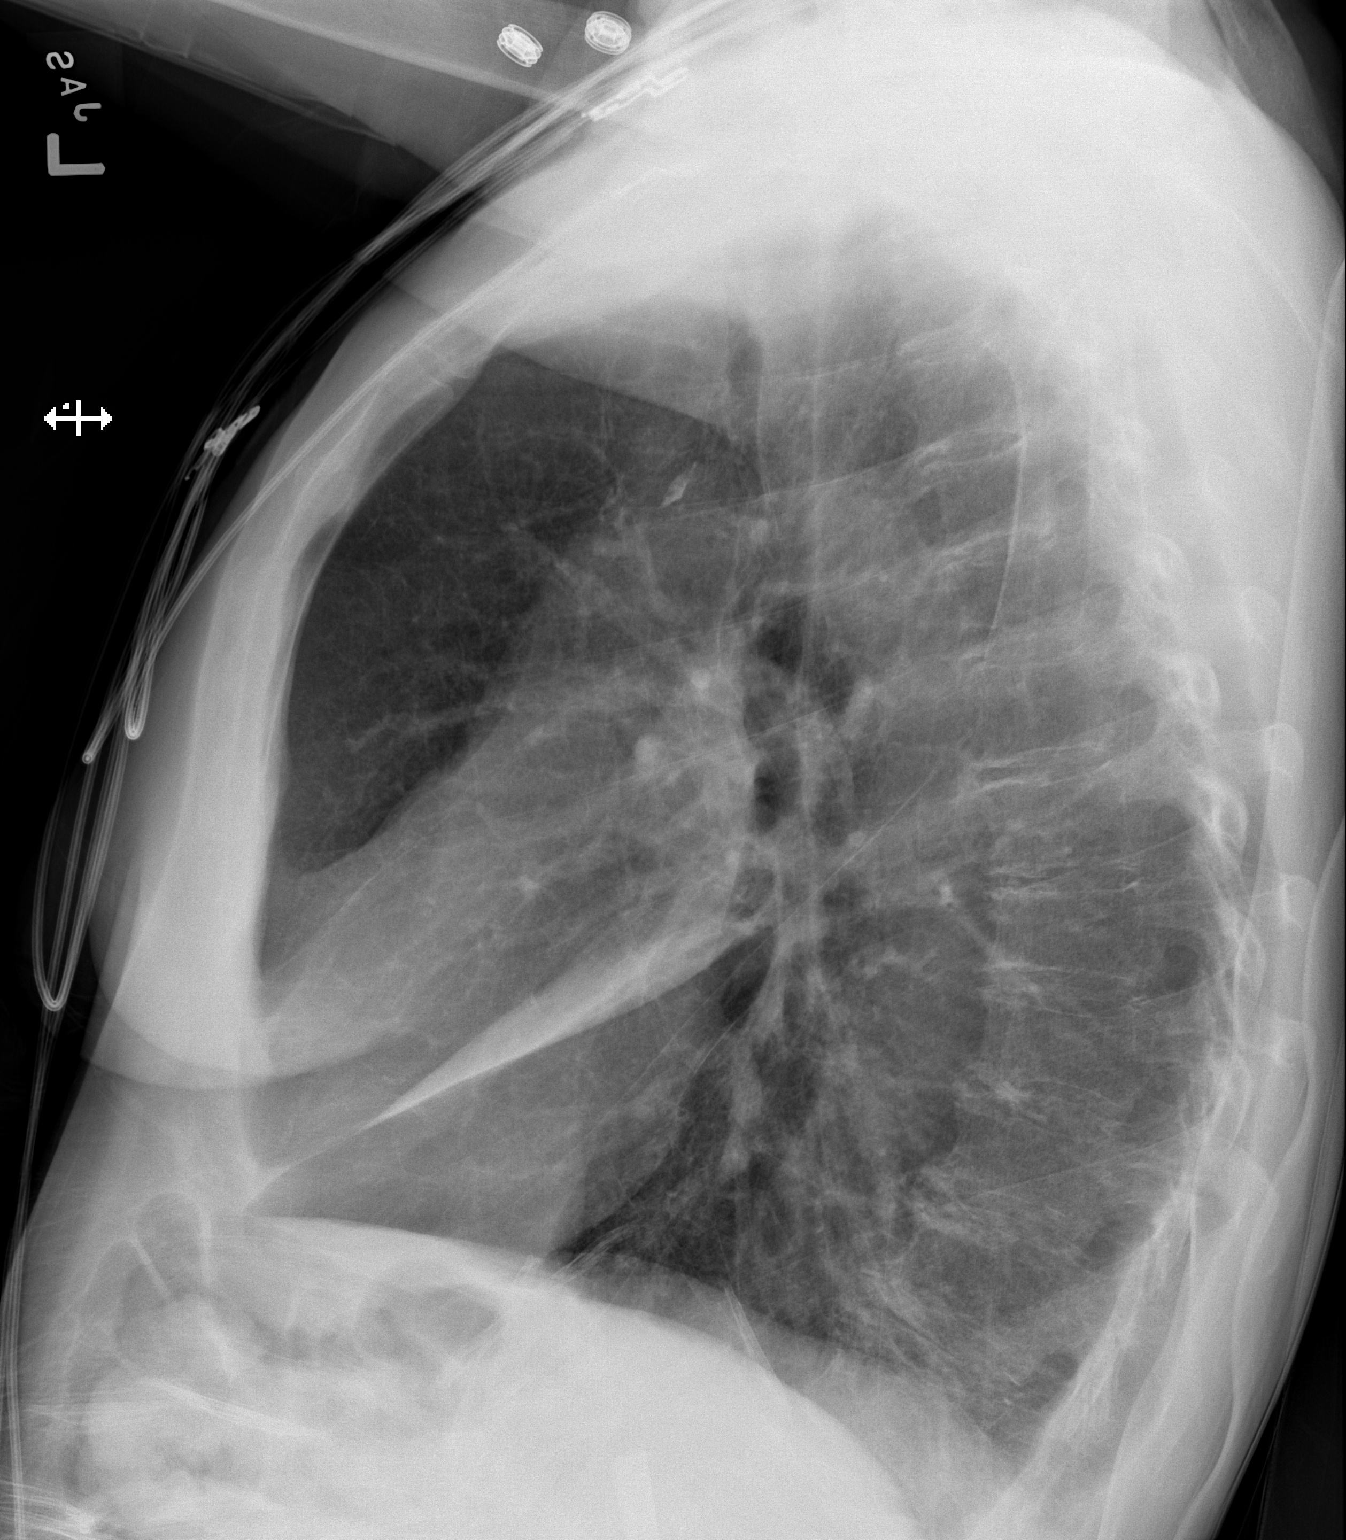

[x chest ap]
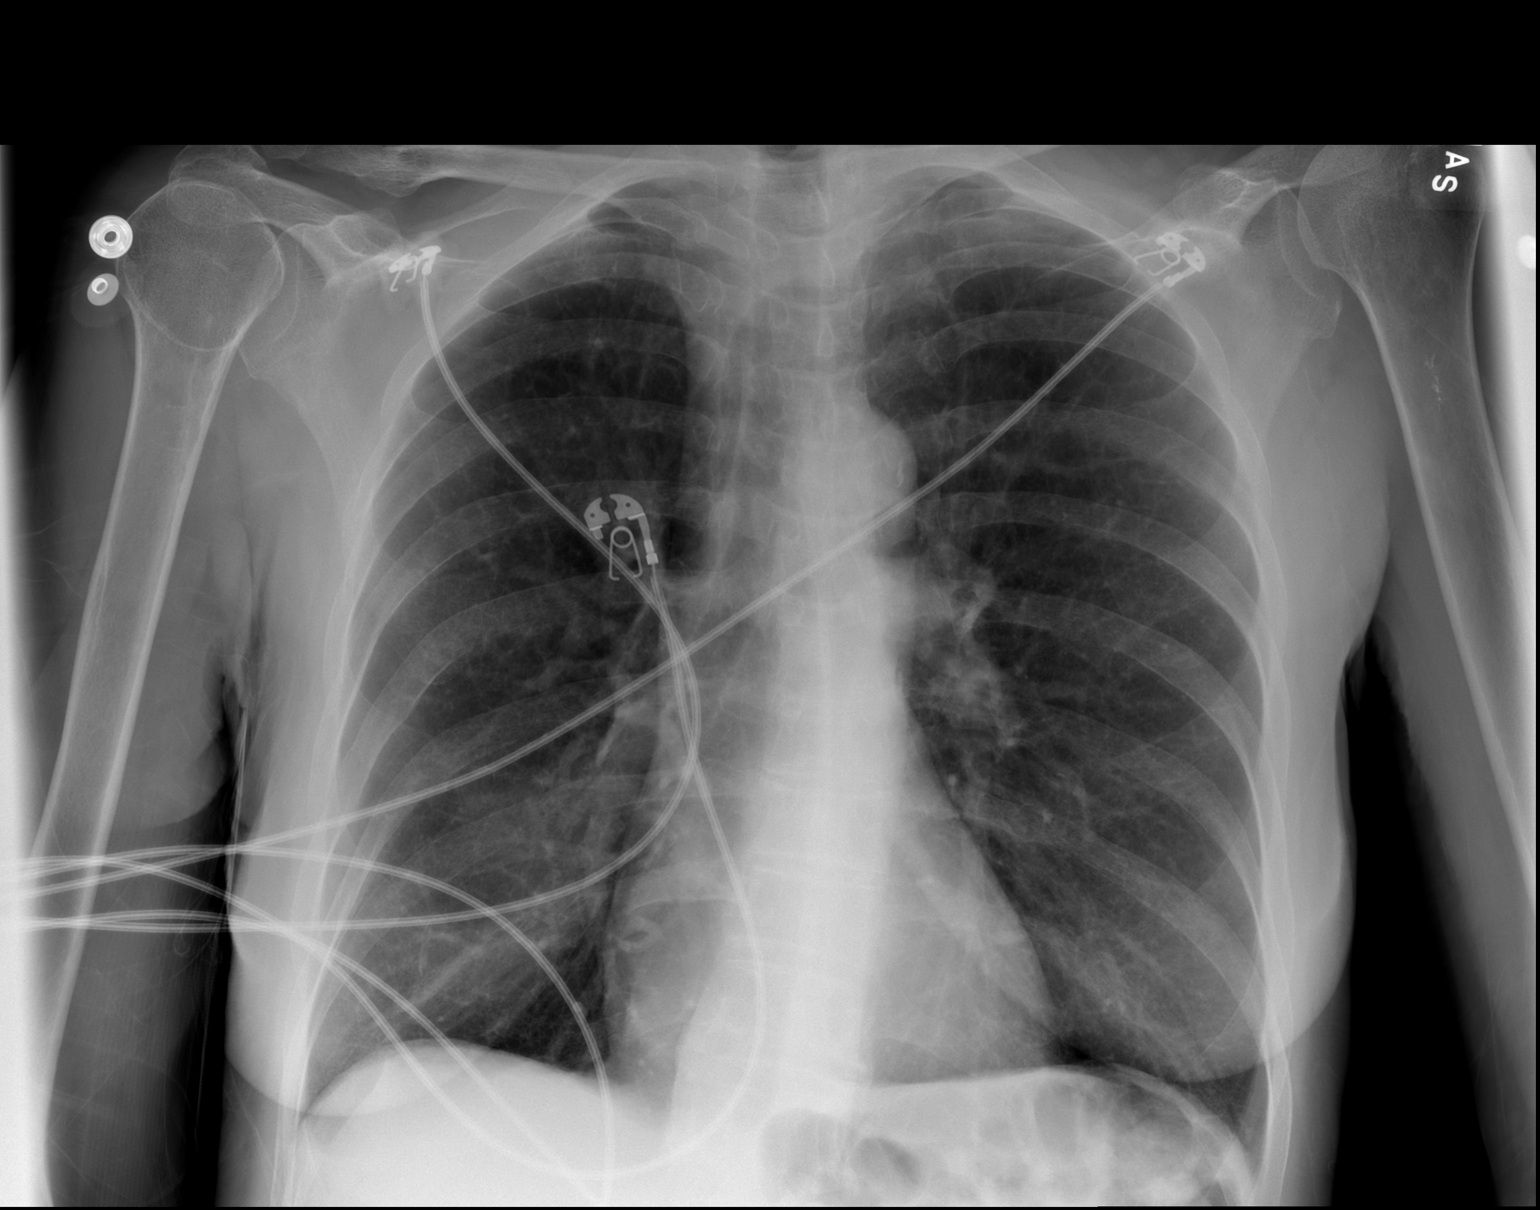

[2 of 2 positions shown; findings below may reference images not displayed]

FINDINGS: There is focal airspace consolidation with volume loss in a portion
of the right middle lobe. Lungs elsewhere clear. Heart size and
pulmonary vascularity are normal. No adenopathy. There is aortic
atherosclerosis. No pneumothorax. There is evidence of an old healed
fracture of the lateral right clavicle. No acute fracture evident.
IMPRESSION: Focal airspace consolidation with volume loss in right middle lobe.
Lungs elsewhere clear. Stable cardiac silhouette. Aortic
atherosclerosis. No pneumothorax. Old fracture lateral right
clavicle.

Aortic Atherosclerosis ([id number]).

Followup PA and lateral chest radiographs recommended in 3-4 weeks
following trial of antibiotic therapy to ensure resolution and
exclude underlying malignancy. It should be noted an obstructing
lesion in a segmental right middle lobe bronchus could cause the
current appearance.

## 2020-04-29 IMAGING — MR MR CERVICAL SPINE W/O CM
5 series · 34 of 48 positions shown · non-contrast
Comparison: Prior CT from 03/19/2018 as well as previous MRI from
12/19/2017.

CLINICAL DATA: Initial evaluation for acute left-sided numbness.

EXAM:
MRI HEAD WITHOUT CONTRAST
MRI CERVICAL SPINE WITHOUT CONTRAST
TECHNIQUE: Multiplanar, multiecho pulse sequences of the brain and surrounding
structures, and cervical spine, to include the craniocervical
junction and cervicothoracic junction, were obtained without
intravenous contrast.

[Series 9: T2 · sagittal · 3.0mm · 0.69mm/px · 6 of 15 slices shown (1 of 2)]
[im 1/15]
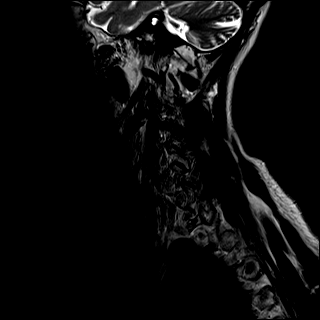
[im 3/15]
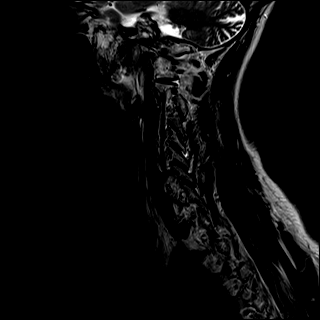
[im 6/15]
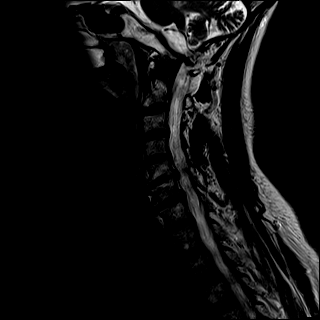
[im 9/15]
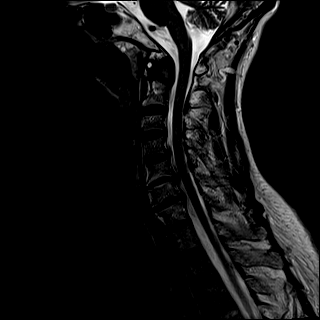
[im 12/15]
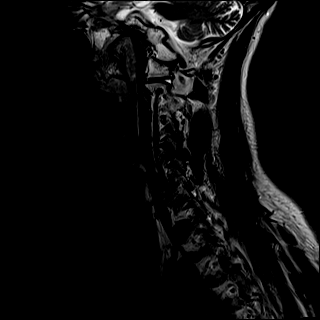
[im 15/15]
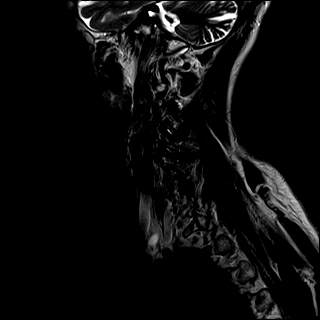

[Series 10: T1 · sagittal · 3.0mm · 0.69mm/px · 6 of 15 slices shown]
[im 1/15]
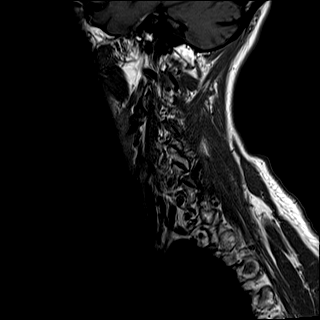
[im 3/15]
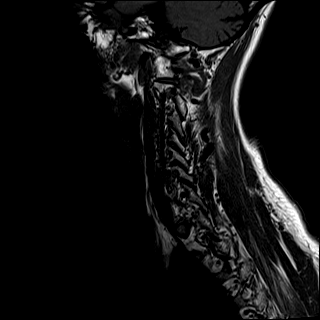
[im 6/15]
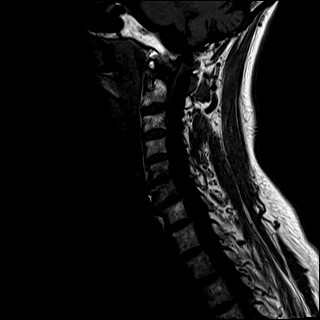
[im 9/15]
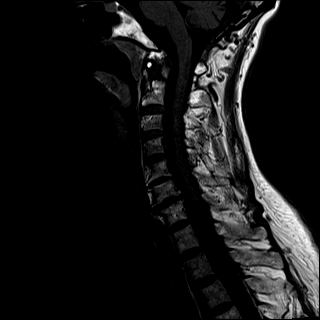
[im 12/15]
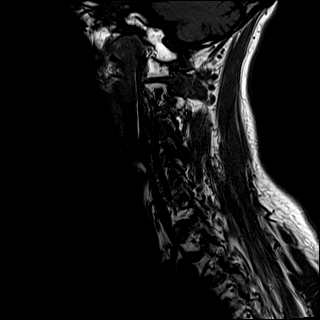
[im 15/15]
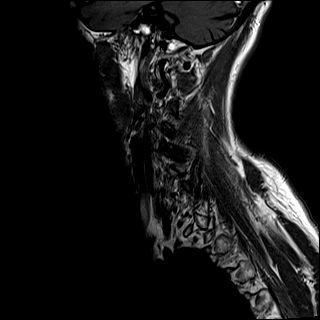

[Series 11: STIR · sagittal · 3.0mm · 0.86mm/px · 6 of 15 slices shown]
[im 1/15]
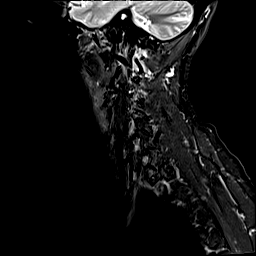
[im 3/15]
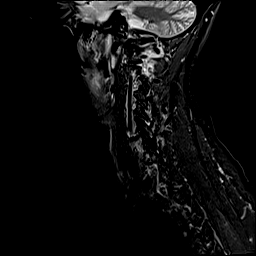
[im 6/15]
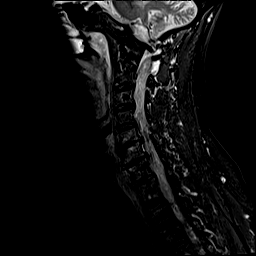
[im 9/15]
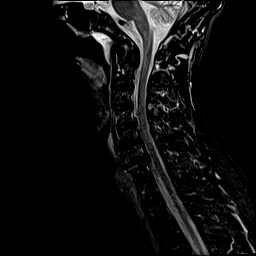
[im 12/15]
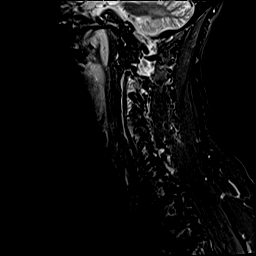
[im 15/15]
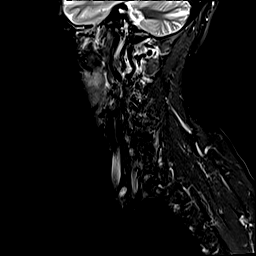

[Series 12: T2 · axial · 3.0mm · 0.66mm/px · z∈[-199,-74]mm · 9 of 40 slices shown (2 of 2)]
[im 1/40]
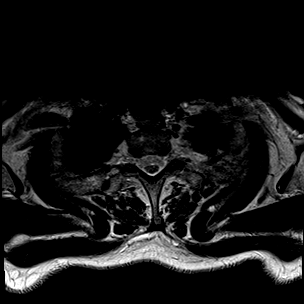
[im 6/40]
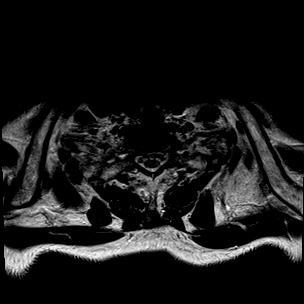
[im 12/40]
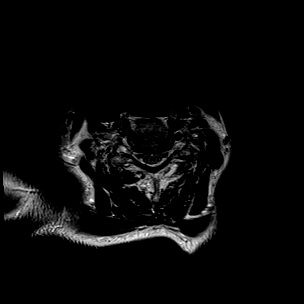
[im 17/40]
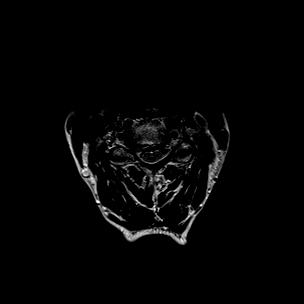
[im 20/40]
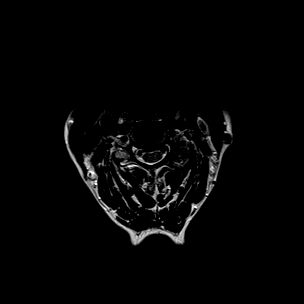
[im 23/40]
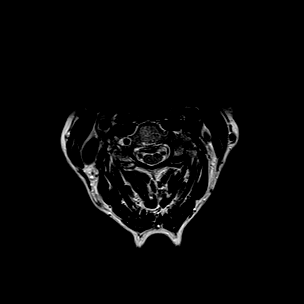
[im 28/40]
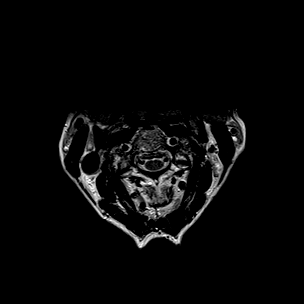
[im 34/40]
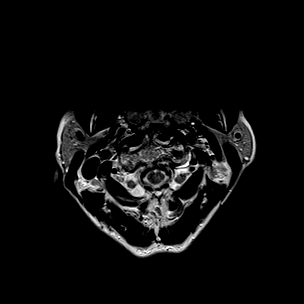
[im 40/40]
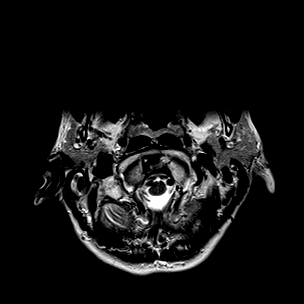

[Series 14: GRE · axial · 3.0mm · 0.39mm/px · z∈[-199,-94]mm · 7 of 40 slices shown]
[im 1/40]
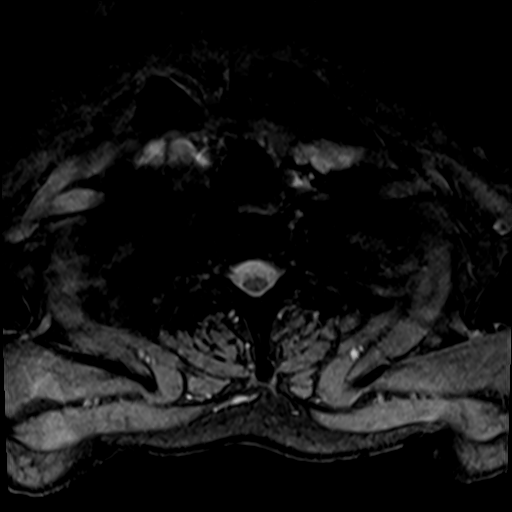
[im 6/40]
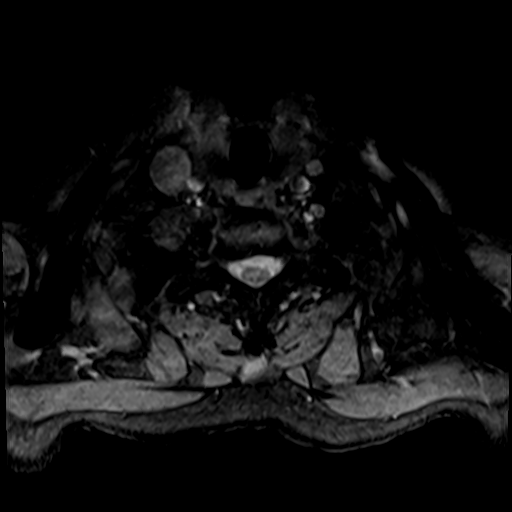
[im 12/40]
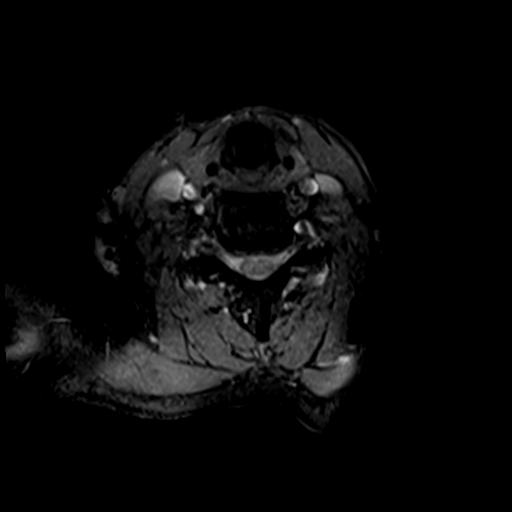
[im 17/40]
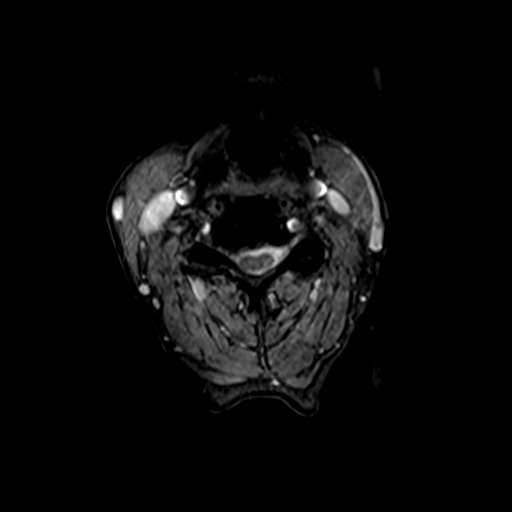
[im 23/40]
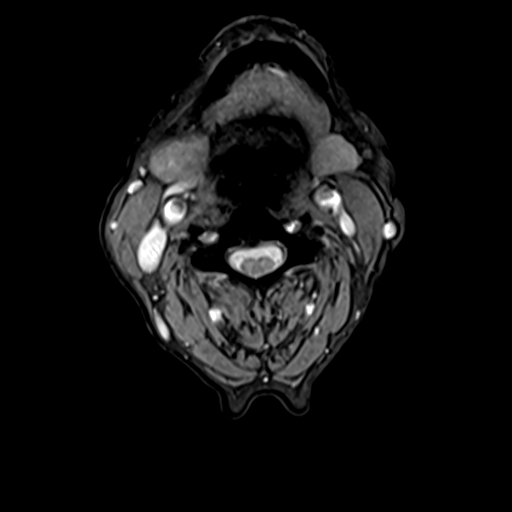
[im 28/40]
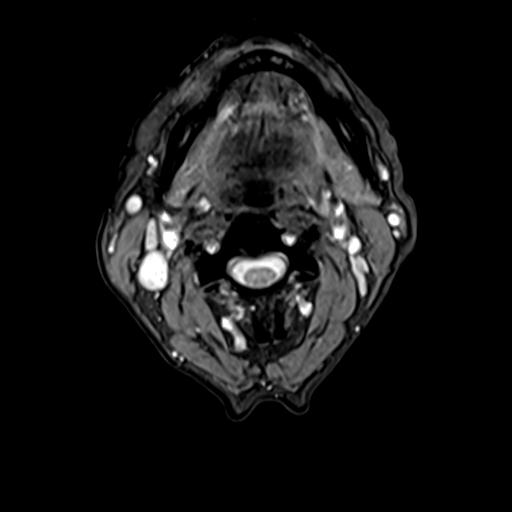
[im 34/40]
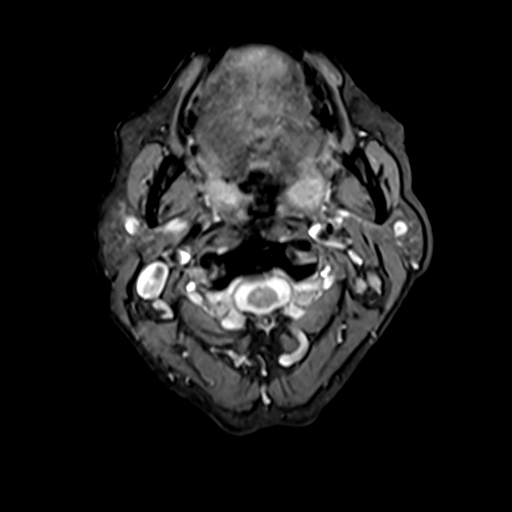

[34 of 48 positions shown; findings below may reference images not displayed]

FINDINGS: MRI HEAD FINDINGS

Brain: Cerebral volume within normal limits and stable. Large volume
right parieto-occipital encephalomalacia, unchanged. No acute
intracranial infarct. No acute or chronic intracranial hemorrhage.
No mass lesion, midline shift or mass effect. No hydrocephalus. Ex
vacuo dilatation of the right lateral ventricle related to the right
cerebral encephalomalacia. No extra-axial fluid collection. Normal
pituitary gland.

Vascular: Major intracranial vascular flow voids are maintained.

Skull and upper cervical spine: Craniocervical junction normal.
Prior right parietooccipital craniectomy with cranioplasty. No scalp
soft tissue abnormality. Bone marrow signal intensity normal.

Sinuses/Orbits: Globes and orbital soft tissues within normal
limits. Paranasal sinuses are clear. Small left mastoid effusion, of
doubtful significance.

Other: None.

MRI CERVICAL SPINE FINDINGS

Alignment: Vertebral bodies normally aligned with preservation of
the normal cervical lordosis. No listhesis.

Vertebrae: Vertebral body height maintained. No acute or chronic
fracture. Bone marrow signal intensity mildly heterogeneous but
within normal limits. Reactive endplate changes about the C5-6 and
C6-7 interspaces. No worrisome osseous lesions.

Cord: Signal intensity within the cervical spinal cord is normal

Posterior Fossa, vertebral arteries, paraspinal tissues: Paraspinous
soft tissues within normal limits. Normal intravascular flow voids
present within the vertebral arteries bilaterally.

Disc levels:

C2-C3: Unremarkable.

C3-C4: Mild diffuse disc bulge with uncovertebral hypertrophy. Mild
left-sided facet hypertrophy. No stenosis.

C4-C5: Mild disc bulge. Prominent left-sided facet degeneration.
Resultant moderate left C5 foraminal stenosis. No canal or right
foraminal narrowing.

C5-C6: Chronic diffuse degenerative disc osteophyte, asymmetric to
the right. Broad posterior component flattens the ventral CSF. Mild
spinal stenosis without cord deformity. Severe right C6 foraminal
stenosis.

C6-C7: Diffuse circumferential disc osteophyte. Flattening of the
ventral CSF with mild spinal stenosis. Foramina remain patent.

C7-T1:  Unremarkable.

Visualized upper thoracic spine within normal limits.
IMPRESSION: MRI HEAD IMPRESSION:

1. No acute intracranial abnormality.
2. Chronic right parieto-occipital encephalomalacia, stable.

MRI CERVICAL SPINE IMPRESSION:

1. No acute abnormality within the cervical spine.
2. Prominent left-sided facet hypertrophy at C4-5 with resultant
moderate left C5 foraminal stenosis.
3. Degenerative disc osteophyte at C5-6 with resultant severe right
C6 foraminal stenosis.
4. Degenerative spondylolysis at C5-6 and C6-7 with resultant mild
spinal stenosis.

## 2020-04-29 IMAGING — MR MR LUMBAR SPINE W/O CM
4 of 5 series · 26 of 48 positions shown · non-contrast
Comparison: Lumbar radiographs 12/19/2017. Thoracic and lumbar MRI
06/10/2016.

CLINICAL DATA: 62-year-old female with lumbar back pain for greater
than 6 weeks. History of L1 compression fracture.

EXAM:
MRI LUMBAR SPINE WITHOUT CONTRAST
TECHNIQUE: Multiplanar, multisequence MR imaging of the lumbar spine was
performed. No intravenous contrast was administered.

[Series 5: T2 · sagittal · 4.0mm · 0.73mm/px · 6 of 16 slices shown (1 of 2)]
[im 1/16]
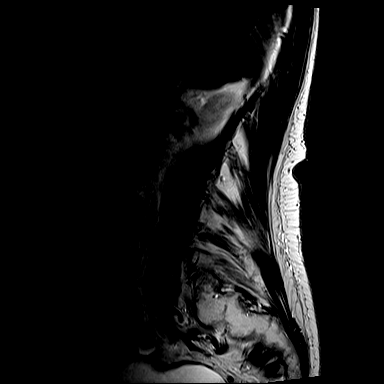
[im 4/16]
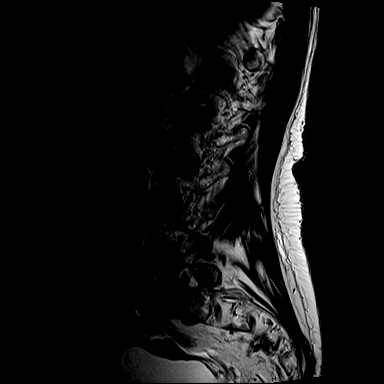
[im 7/16]
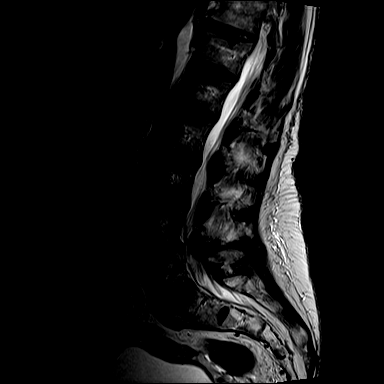
[im 10/16]
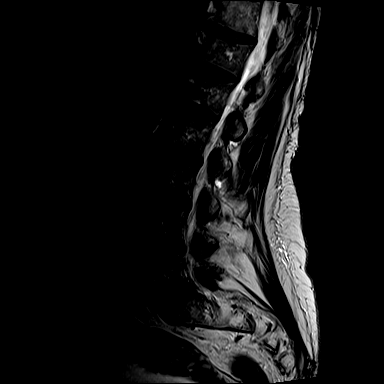
[im 13/16]
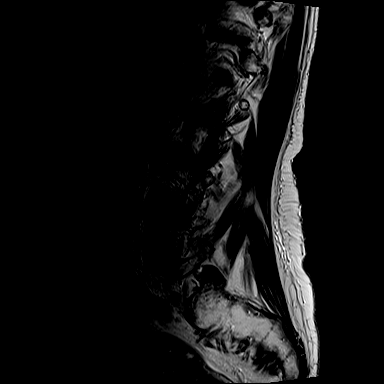
[im 16/16]
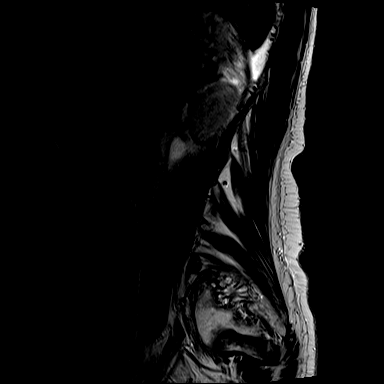

[Series 7: T1 · sagittal · 4.0mm · 0.88mm/px · 7 of 16 slices shown (1 of 2)]
[im 1/16]
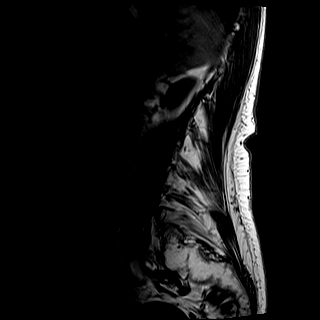
[im 3/16]
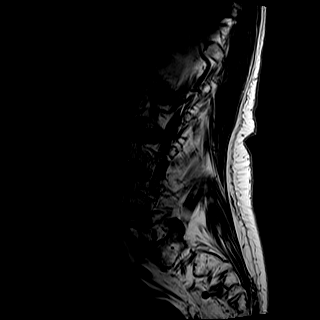
[im 6/16]
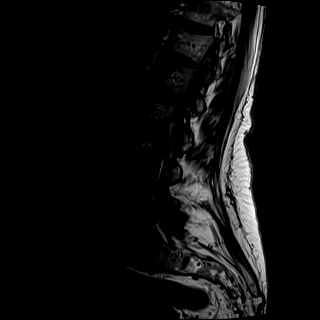
[im 8/16]
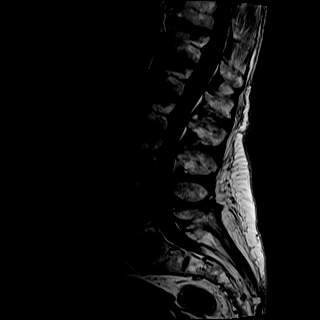
[im 11/16]
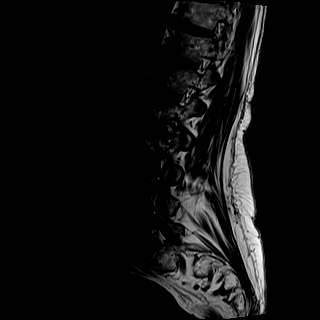
[im 13/16]
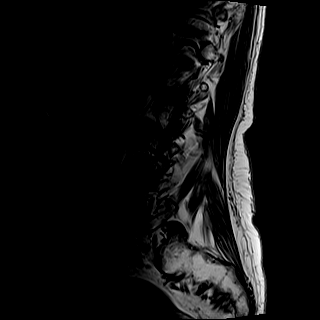
[im 16/16]
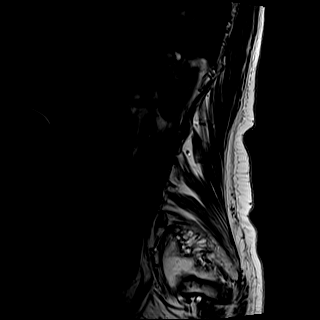

[Series 8: T2 · axial · 4.0mm · 0.57mm/px · z∈[-94,+121]mm · 8 of 33 slices shown (2 of 2)]
[im 1/33]
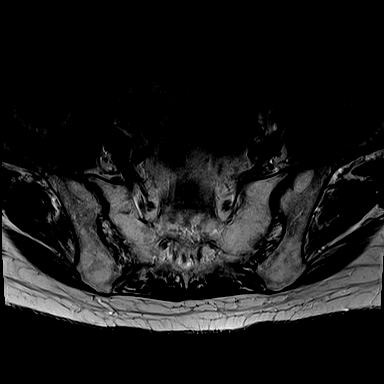
[im 5/33]
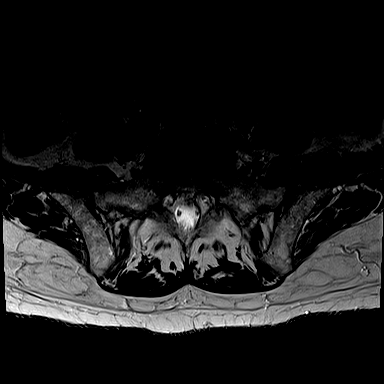
[im 10/33]
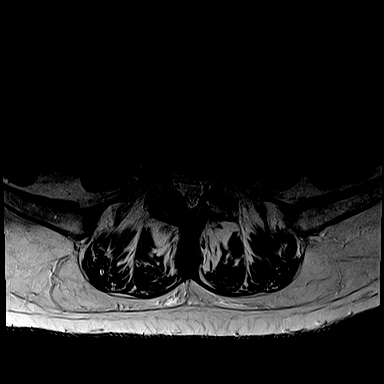
[im 15/33]
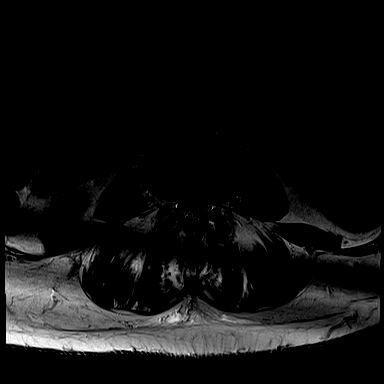
[im 18/33]
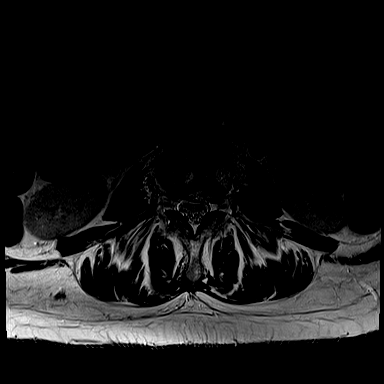
[im 23/33]
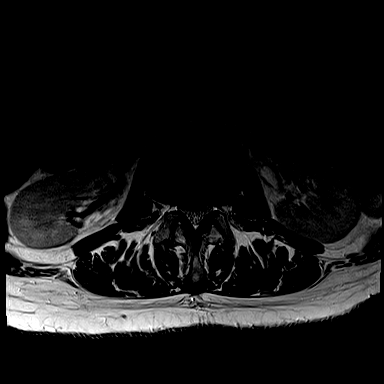
[im 28/33]
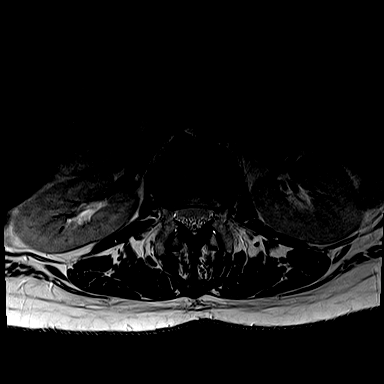
[im 33/33]
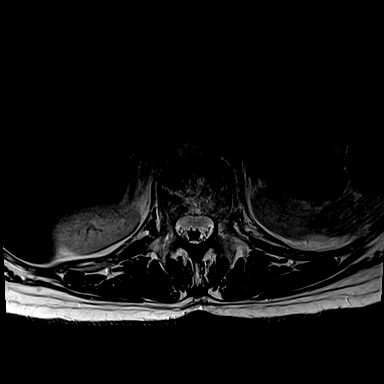

[Series 9: T1 · axial · 4.0mm · 0.34mm/px · z∈[-94,+97]mm · 5 of 33 slices shown (2 of 2)]
[im 1/33]
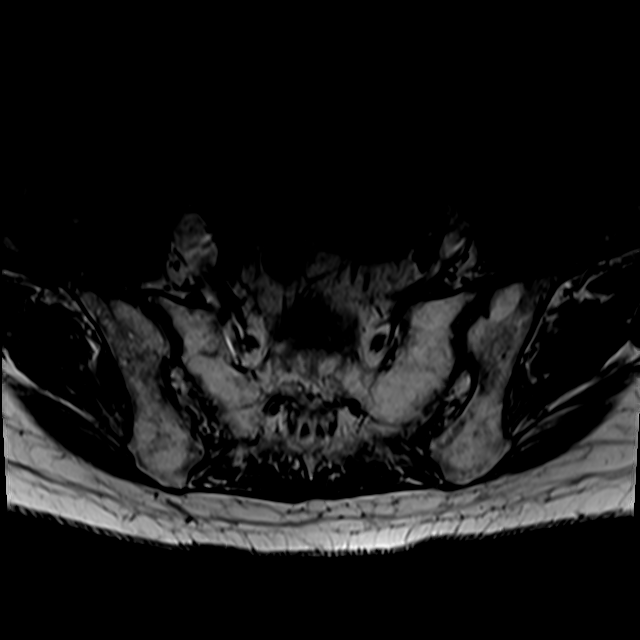
[im 5/33]
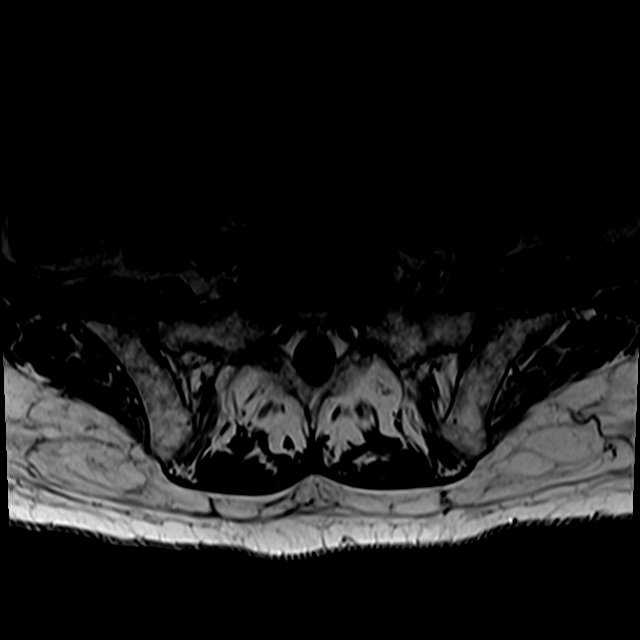
[im 10/33]
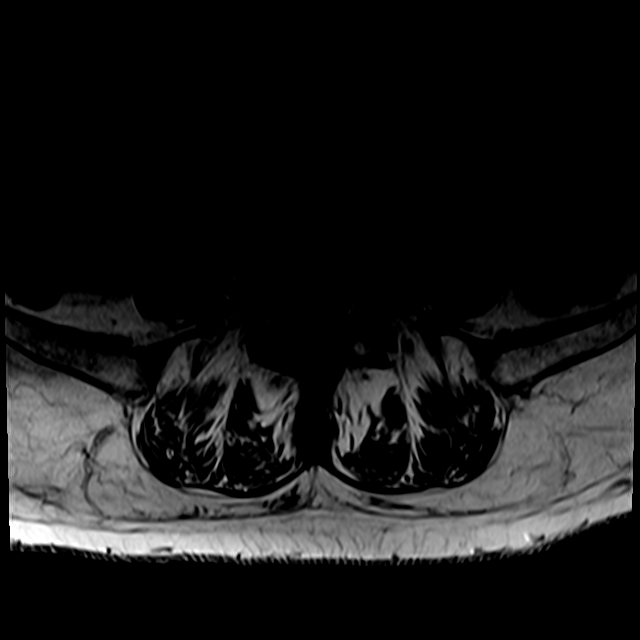
[im 18/33]
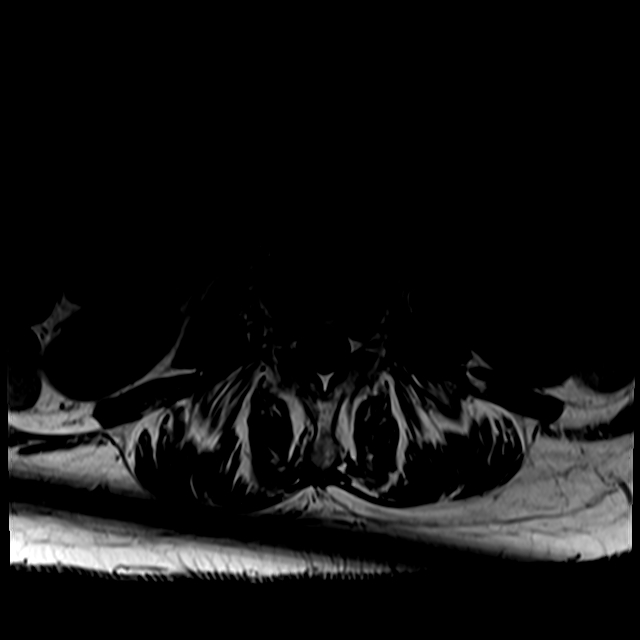
[im 28/33]
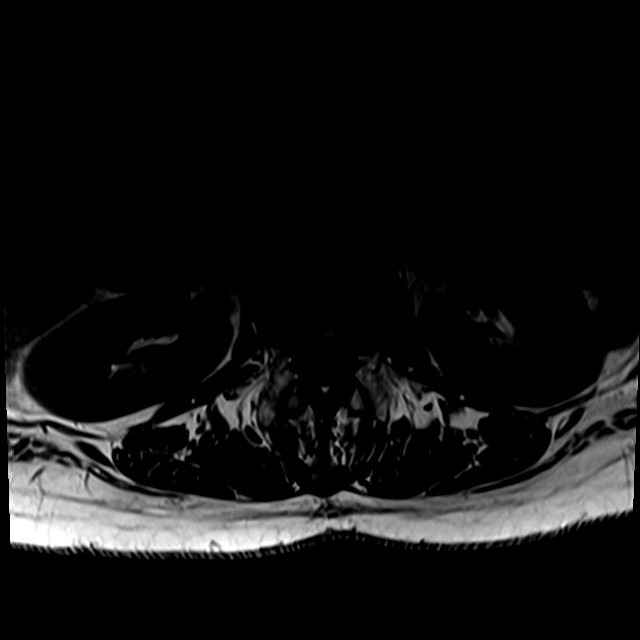

[26 of 48 positions shown; findings below may reference images not displayed]

FINDINGS: Segmentation: Normal, which is the same numbering system on the 2835
MRI.

Alignment:  Stable lumbar lordosis.

Vertebrae: Stable vertebral body height since the 2835 MRI. Mild
chronic endplate irregularity including at L1. Regressed
degenerative appearing right posterolateral L4-L5 endplate edema. No
new marrow edema or evidence of acute osseous abnormality.
Visualized bone marrow signal is within normal limits.

Conus medullaris and cauda equina: Conus extends to the L1 level. No
lower spinal cord or conus signal abnormality.

Paraspinal and other soft tissues: Negative visible abdominal
viscera.

Disc levels:

T11-T12: Chronic anterior disc bulging and endplate spurring. No
stenosis.

T12-L1:  Negative.

L1-L2:  Negative.

L2-L3: Stable mild disc bulging, facet and ligament flavum
hypertrophy. Stable borderline to mild spinal stenosis.

L3-L4: Stable mild chronic disc bulging, mild facet and ligament
flavum hypertrophy. Stable mild spinal stenosis. No lateral recess
or foraminal stenosis.

L4-L5: Stable mild circumferential disc bulging and endplate
spurring. Stable mild facet and ligament flavum hypertrophy; small
posteriorly situated synovial cyst on the right which should not
cause neural compromise. Stable borderline to mild spinal stenosis
with no lateral recess or foraminal stenosis.

L5-S1:  Negative.
IMPRESSION: 1. No acute findings in the lumbar spine.
2. Chronic borderline to mild multifactorial spinal stenosis L2-L3
through L4-L5 with no convincing lateral recess or foraminal
stenosis.

## 2020-04-29 IMAGING — MR MR HEAD W/O CM
10 of 11 series · 43 of 48 positions shown · non-contrast
Comparison: Prior CT from 03/19/2018 as well as previous MRI from
12/19/2017.

CLINICAL DATA: Initial evaluation for acute left-sided numbness.

EXAM:
MRI HEAD WITHOUT CONTRAST
MRI CERVICAL SPINE WITHOUT CONTRAST
TECHNIQUE: Multiplanar, multiecho pulse sequences of the brain and surrounding
structures, and cervical spine, to include the craniocervical
junction and cervicothoracic junction, were obtained without
intravenous contrast.

[Series 5: ax dwi_tracew · axial · 3.0mm · 1.50mm/px · z∈[-80,+52]mm · 9 of 92 slices shown]
[im 1/92]
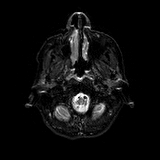
[im 12/92]
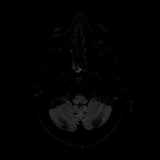
[im 23/92]
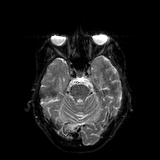
[im 35/92]
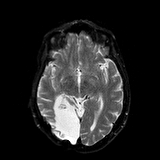
[im 46/92]
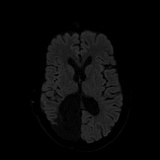
[im 57/92]
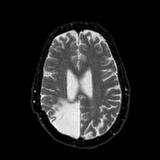
[im 69/92]
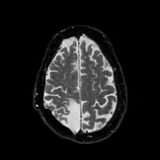
[im 80/92]
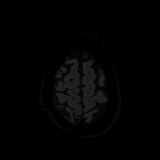
[im 92/92]
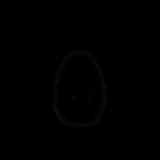

[Series 6: ax dwi_adc · axial · 3.0mm · 1.50mm/px · z∈[-80,+52]mm · 5 of 46 slices shown]
[im 1/46]
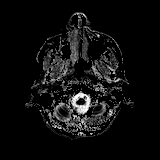
[im 12/46]
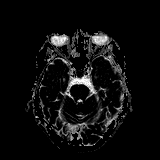
[im 23/46]
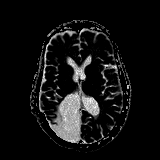
[im 34/46]
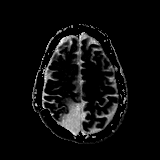
[im 46/46]
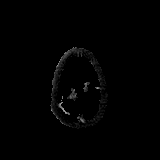

[Series 7: cor dwi_tracew · coronal · 5.0mm · 1.44mm/px · 7 of 72 slices shown]
[im 1/72]
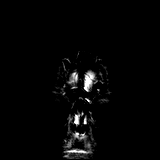
[im 12/72]
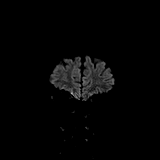
[im 24/72]
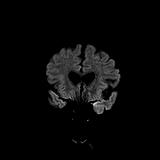
[im 36/72]
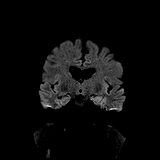
[im 48/72]
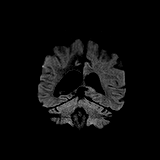
[im 60/72]
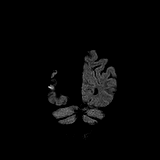
[im 72/72]
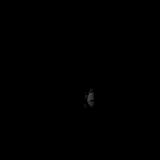

[Series 8: cor dwi_adc · coronal · 5.0mm · 1.44mm/px · 4 of 36 slices shown]
[im 1/36]
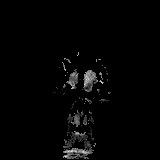
[im 12/36]
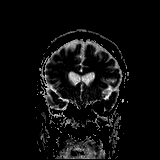
[im 24/36]
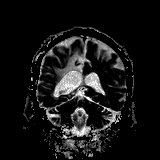
[im 36/36]
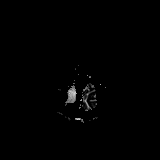

[Series 9: T1 · sagittal · 5.0mm · 0.75mm/px · 2 of 23 slices shown]
[im 1/23]
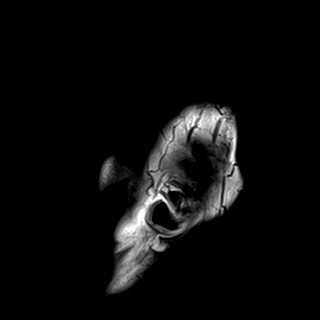
[im 23/23]
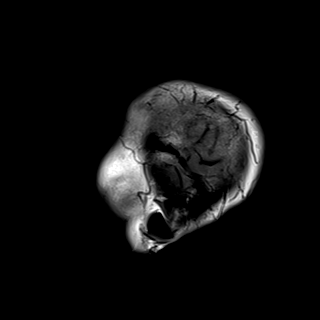

[Series 10: T2 · axial · 5.0mm · 0.72mm/px · z∈[-80,+61]mm · 2 of 25 slices shown (1 of 2)]
[im 1/25]
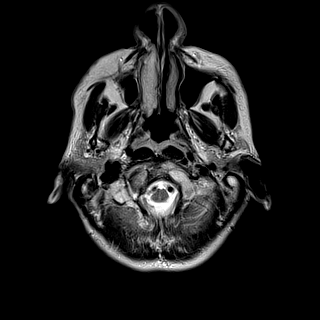
[im 25/25]
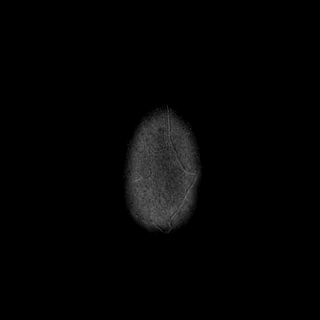

[Series 11: FLAIR · axial · 5.0mm · 0.45mm/px · z∈[-77,+64]mm · 2 of 25 slices shown]
[im 1/25]
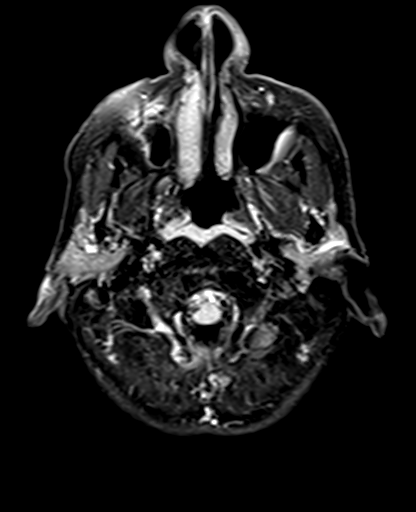
[im 25/25]
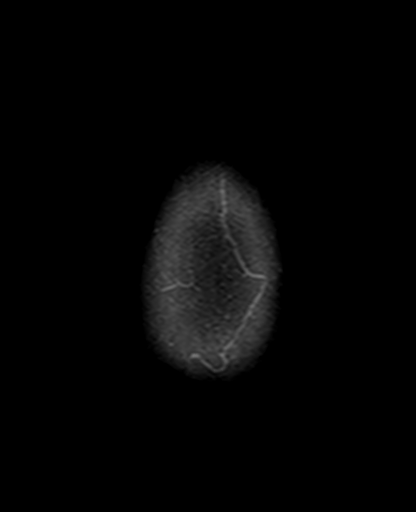

[Series 12: swi_images · axial · 3.0mm · 0.90mm/px · z∈[-77,+61]mm · 5 of 48 slices shown]
[im 1/48]
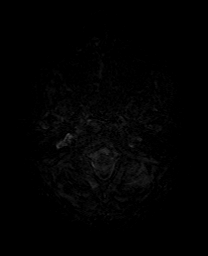
[im 12/48]
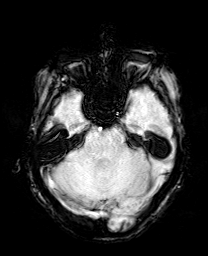
[im 24/48]
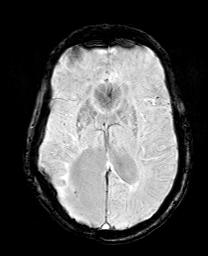
[im 36/48]
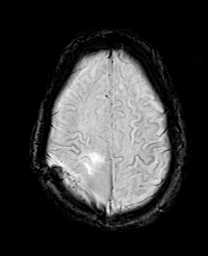
[im 48/48]
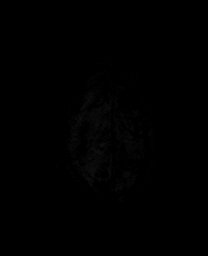

[Series 13: mip_images(sw) · axial · 24.0mm · 0.90mm/px · z∈[-67,+50]mm · 4 of 41 slices shown]
[im 1/41]
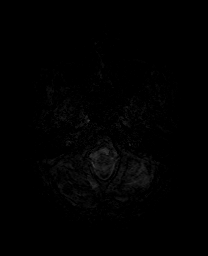
[im 14/41]
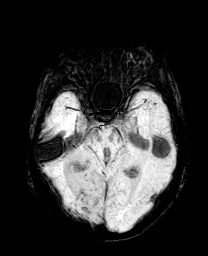
[im 27/41]
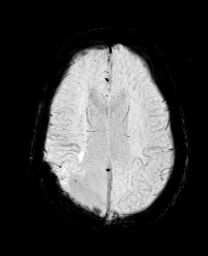
[im 41/41]
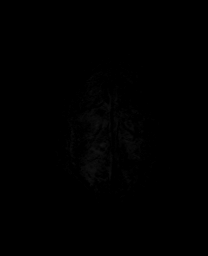

[Series 15: T2 · coronal · 5.0mm · 0.34mm/px · 3 of 30 slices shown (2 of 2)]
[im 1/30]
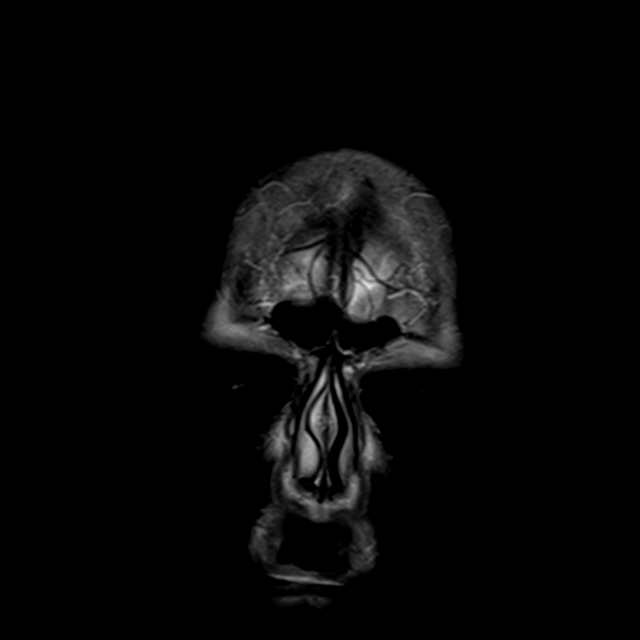
[im 15/30]
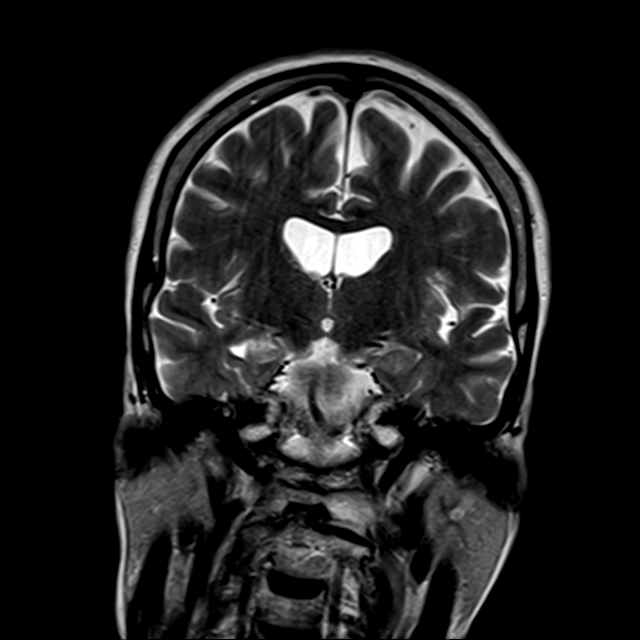
[im 30/30]
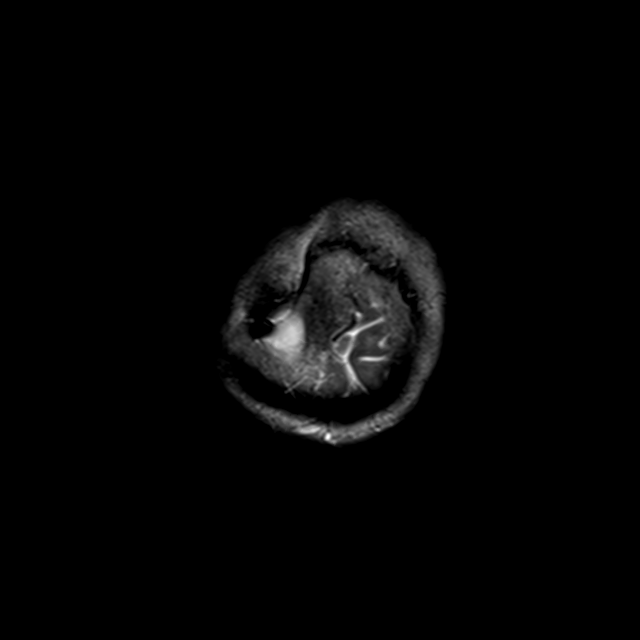

[43 of 48 positions shown; findings below may reference images not displayed]

FINDINGS: MRI HEAD FINDINGS

Brain: Cerebral volume within normal limits and stable. Large volume
right parieto-occipital encephalomalacia, unchanged. No acute
intracranial infarct. No acute or chronic intracranial hemorrhage.
No mass lesion, midline shift or mass effect. No hydrocephalus. Ex
vacuo dilatation of the right lateral ventricle related to the right
cerebral encephalomalacia. No extra-axial fluid collection. Normal
pituitary gland.

Vascular: Major intracranial vascular flow voids are maintained.

Skull and upper cervical spine: Craniocervical junction normal.
Prior right parietooccipital craniectomy with cranioplasty. No scalp
soft tissue abnormality. Bone marrow signal intensity normal.

Sinuses/Orbits: Globes and orbital soft tissues within normal
limits. Paranasal sinuses are clear. Small left mastoid effusion, of
doubtful significance.

Other: None.

MRI CERVICAL SPINE FINDINGS

Alignment: Vertebral bodies normally aligned with preservation of
the normal cervical lordosis. No listhesis.

Vertebrae: Vertebral body height maintained. No acute or chronic
fracture. Bone marrow signal intensity mildly heterogeneous but
within normal limits. Reactive endplate changes about the C5-6 and
C6-7 interspaces. No worrisome osseous lesions.

Cord: Signal intensity within the cervical spinal cord is normal

Posterior Fossa, vertebral arteries, paraspinal tissues: Paraspinous
soft tissues within normal limits. Normal intravascular flow voids
present within the vertebral arteries bilaterally.

Disc levels:

C2-C3: Unremarkable.

C3-C4: Mild diffuse disc bulge with uncovertebral hypertrophy. Mild
left-sided facet hypertrophy. No stenosis.

C4-C5: Mild disc bulge. Prominent left-sided facet degeneration.
Resultant moderate left C5 foraminal stenosis. No canal or right
foraminal narrowing.

C5-C6: Chronic diffuse degenerative disc osteophyte, asymmetric to
the right. Broad posterior component flattens the ventral CSF. Mild
spinal stenosis without cord deformity. Severe right C6 foraminal
stenosis.

C6-C7: Diffuse circumferential disc osteophyte. Flattening of the
ventral CSF with mild spinal stenosis. Foramina remain patent.

C7-T1:  Unremarkable.

Visualized upper thoracic spine within normal limits.
IMPRESSION: MRI HEAD IMPRESSION:

1. No acute intracranial abnormality.
2. Chronic right parieto-occipital encephalomalacia, stable.

MRI CERVICAL SPINE IMPRESSION:

1. No acute abnormality within the cervical spine.
2. Prominent left-sided facet hypertrophy at C4-5 with resultant
moderate left C5 foraminal stenosis.
3. Degenerative disc osteophyte at C5-6 with resultant severe right
C6 foraminal stenosis.
4. Degenerative spondylolysis at C5-6 and C6-7 with resultant mild
spinal stenosis.

## 2020-05-03 IMAGING — CT CT CHEST W/O CM
2 of 3 series · 15 of 36 positions shown, 18 images · non-contrast
Comparison: Chest x-ray dated 03/19/2018 and CT scan of the chest
dated 05/28/2007

CLINICAL DATA: Aspiration.

EXAM:
CT CHEST WITHOUT CONTRAST
TECHNIQUE: Multidetector CT imaging of the chest was performed following the
standard protocol without IV contrast.

[Series 3: chest w/o 2mm st · axial · non-contrast · 0.73mm/px · z∈[+1137,+1411]mm · 12 of 161 slices shown, 15 images]
[im 12/161  mediastinal]
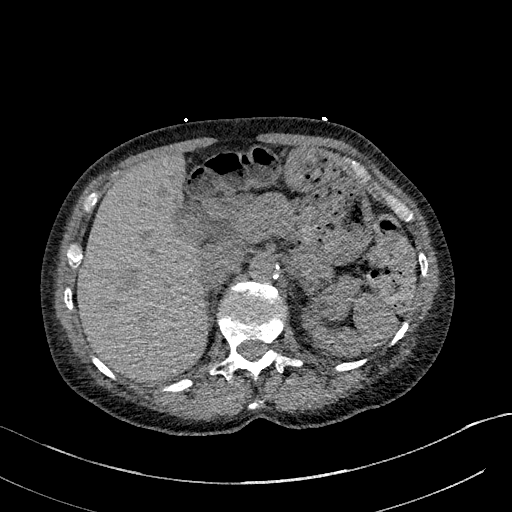
[im 12/161  lung]
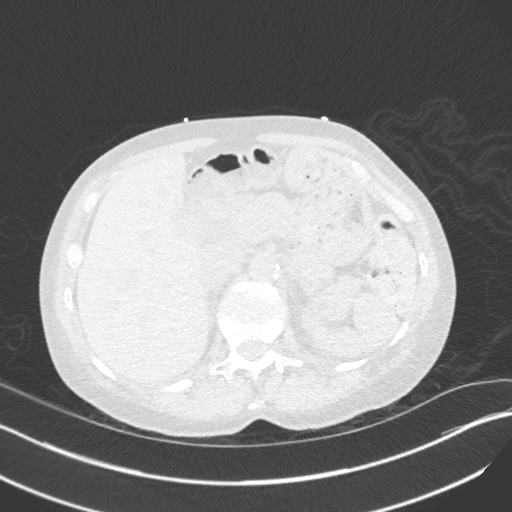
[im 24/161  lung]
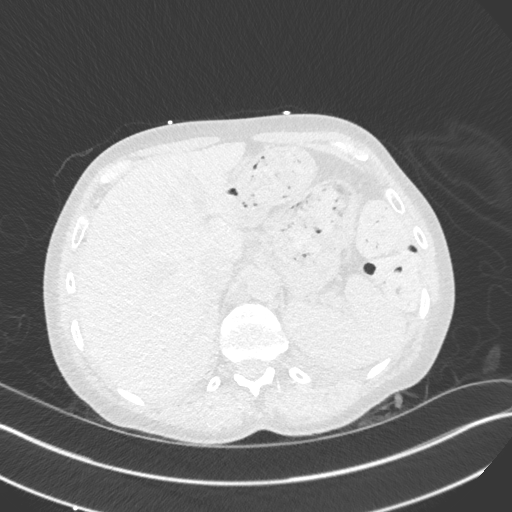
[im 36/161  lung]
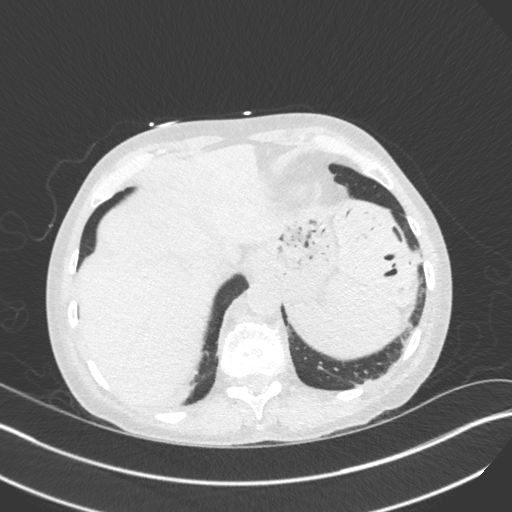
[im 48/161  lung]
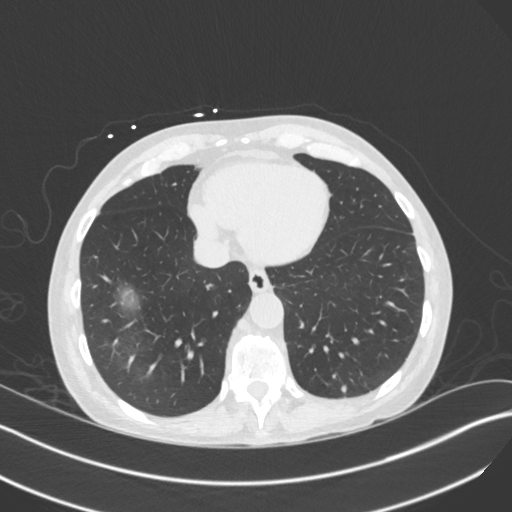
[im 60/161  mediastinal]
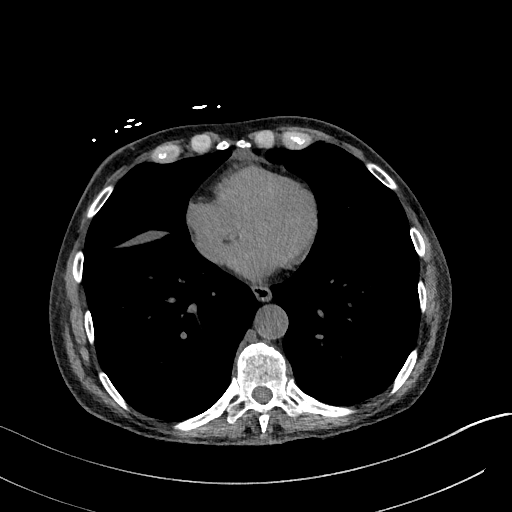
[im 60/161  lung]
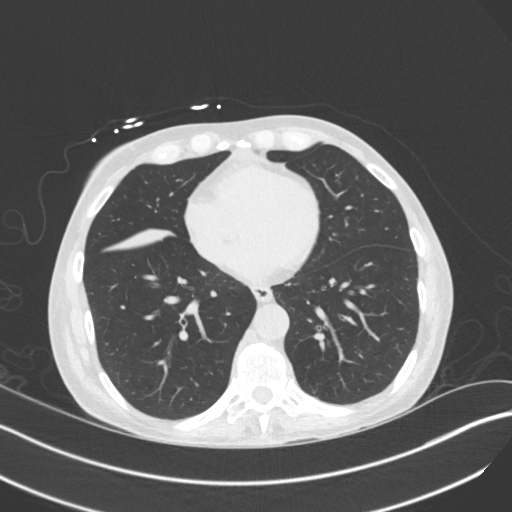
[im 72/161  lung]
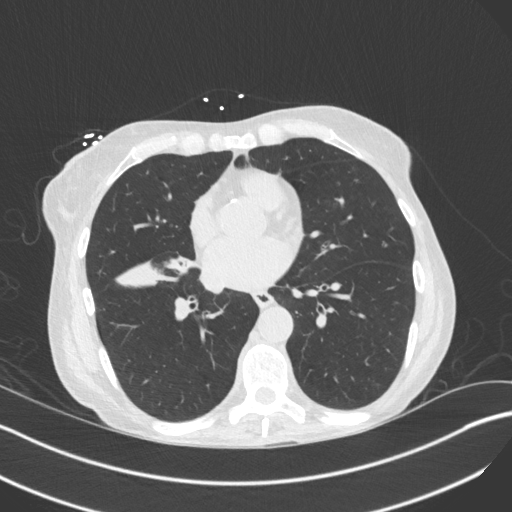
[im 89/161  lung]
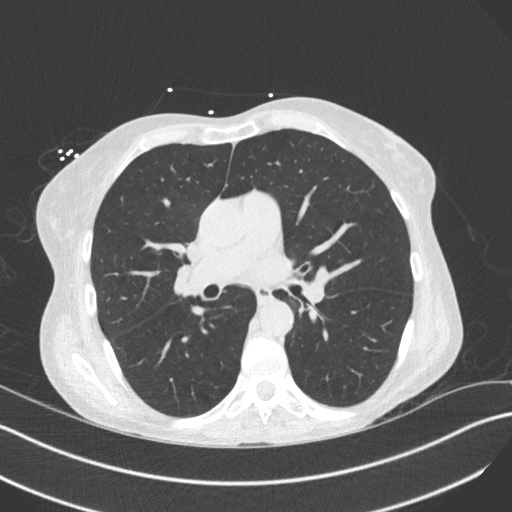
[im 101/161  lung]
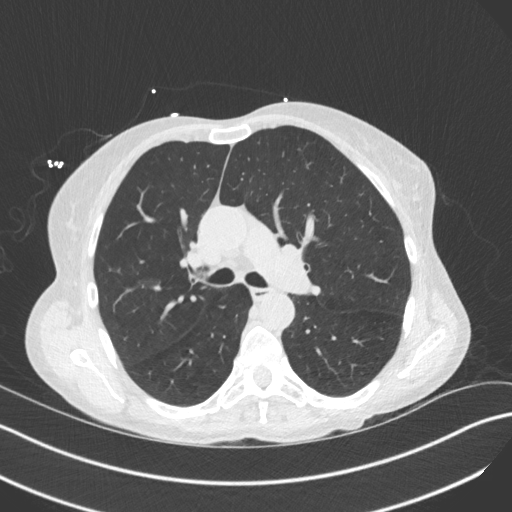
[im 113/161  mediastinal]
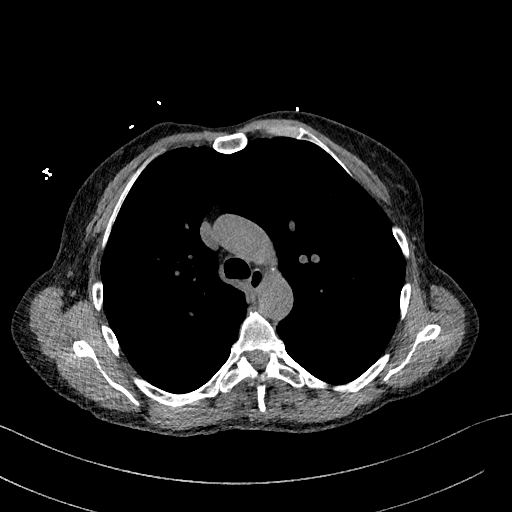
[im 113/161  lung]
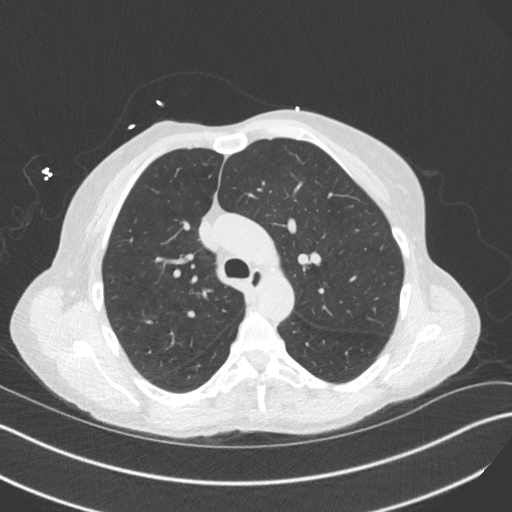
[im 125/161  lung]
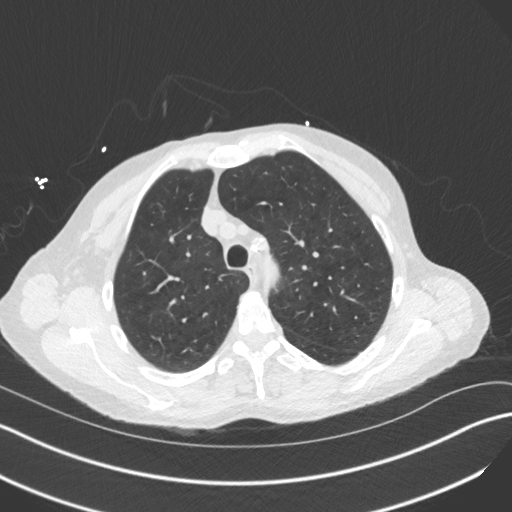
[im 137/161  lung]
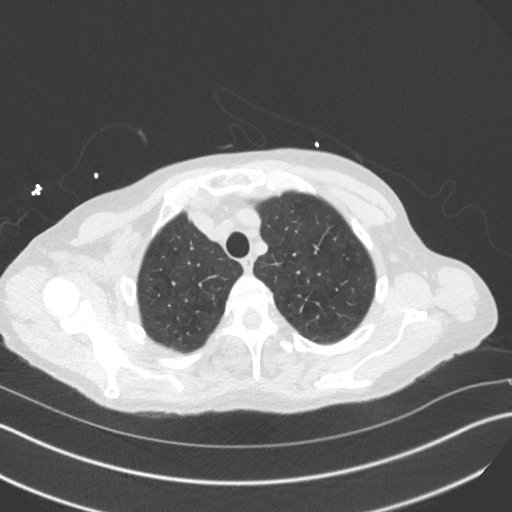
[im 149/161  lung]
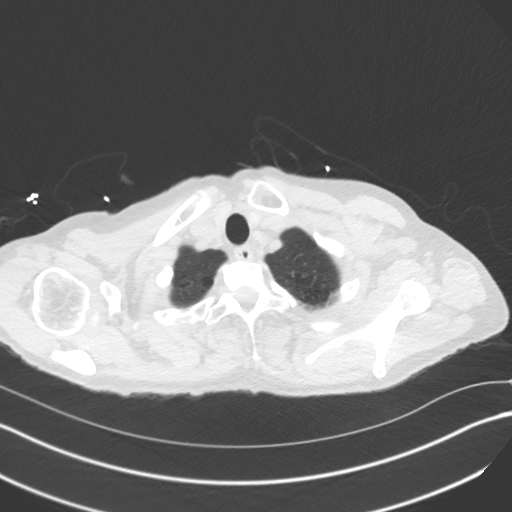

[Series 5: chest w/o 3mm st cor · coronal · non-contrast · 0.63mm/px · 3 of 85 slices shown]
[im 17/85  lung]
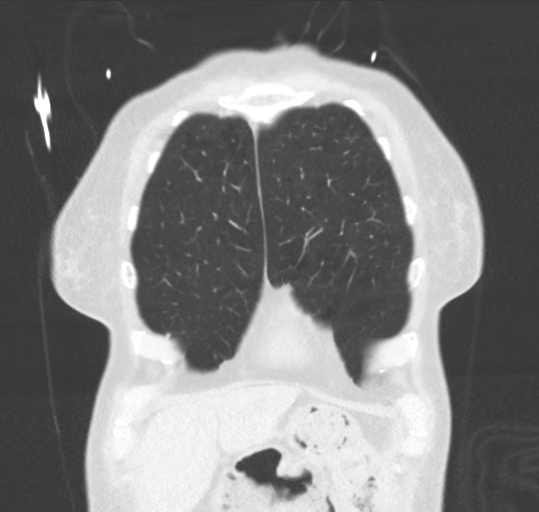
[im 34/85  lung]
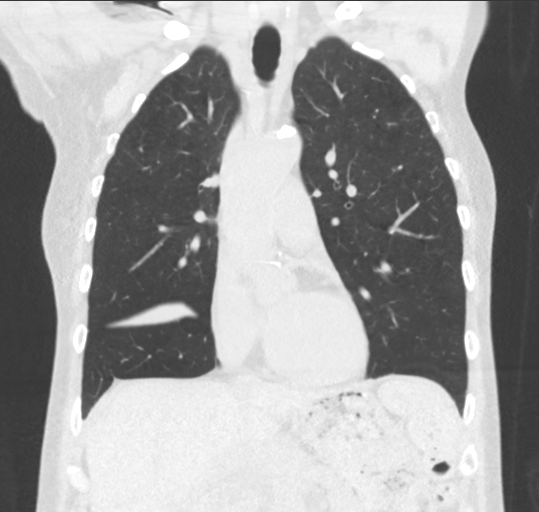
[im 51/85  lung]
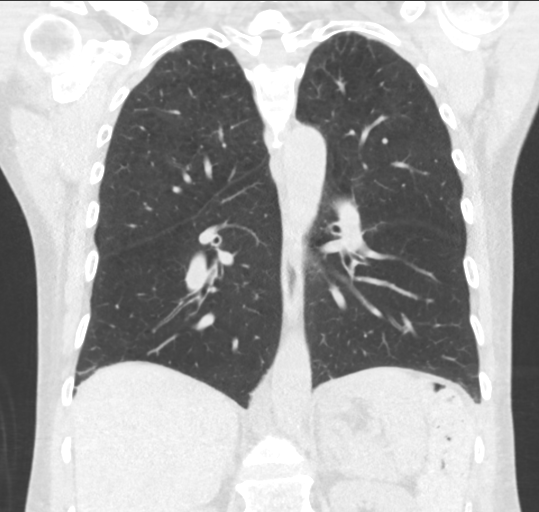

[15 of 36 positions shown; findings below may reference images not displayed]

FINDINGS: Cardiovascular: Aortic atherosclerosis. Heart size is normal. No
significant pericardial effusion.

Mediastinum/Nodes: No adenopathy. Thyroid gland, trachea, and
esophagus are normal.

Lungs/Pleura: There is extensive almost complete atelectasis of the
entire right middle lobe. There is no discrete endobronchial lesion.

The lungs are hyperinflated with diffuse emphysematous changes
particularly in the upper lobes.

Minimal scarring at the lung bases. There are few scattered tiny
pulmonary nodules which are unchanged since 2339.

Upper Abdomen: No acute abnormality.

Musculoskeletal: No acute abnormality. Accentuation of the thoracic
kyphosis. Old bilateral rib fractures.
IMPRESSION: 1. Almost complete atelectasis of the right middle lobe without a
visible bronchial lesion. This could be secondary to aspiration.
2. Emphysema.
3. Aortic atherosclerosis.

## 2020-05-04 IMAGING — RF DG SWALLOWING FUNCTION - [facility]
12 of 14 series · 13 of 24 positions shown · non-contrast
Comparison: none

[Series 1: run · 1 of 23 frames shown (1 of 12)]
[frame 1/23]
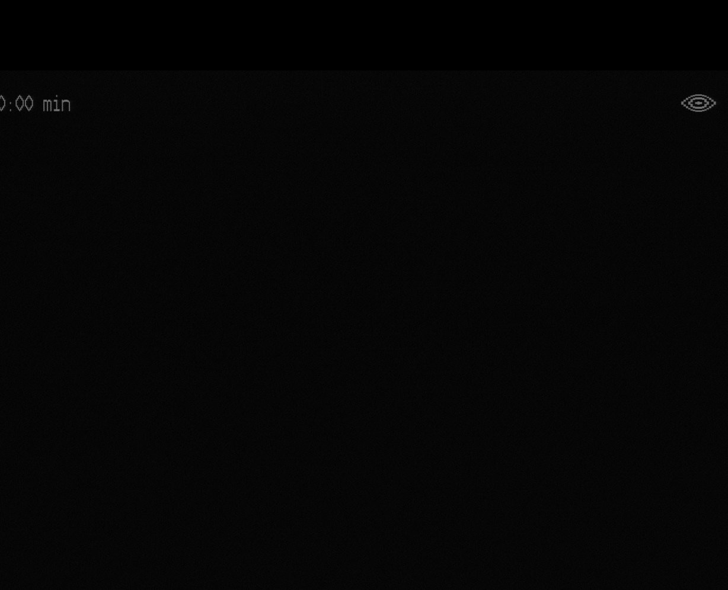

[Series 2: run · 1 of 44 frames shown (2 of 12)]
[frame 7/44]
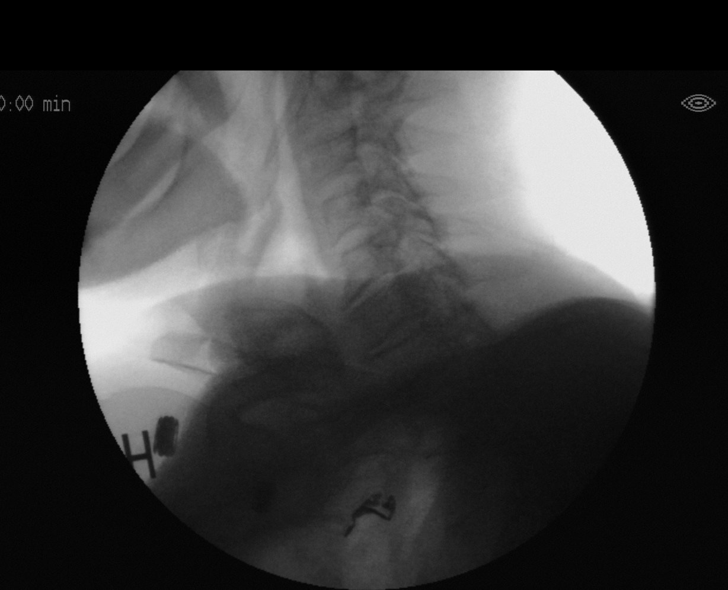

[Series 3: run · 1 of 35 frames shown (3 of 12)]
[frame 18/35]
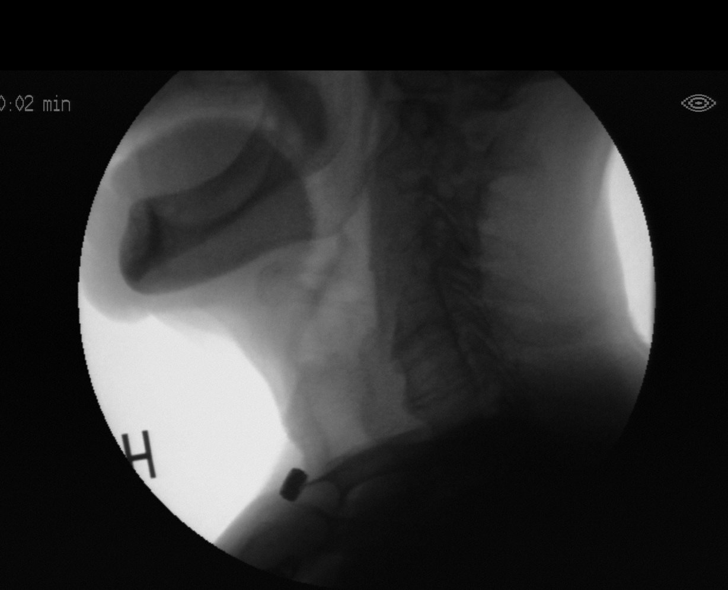

[Series 4: run · 1 of 158 frames shown (4 of 12)]
[frame 80/158]
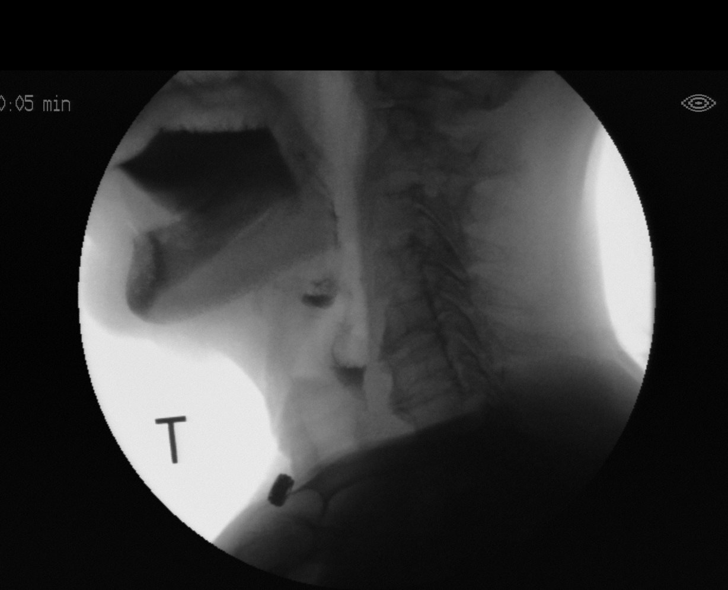

[Series 5: run · 1 of 151 frames shown (5 of 12)]
[frame 142/151]
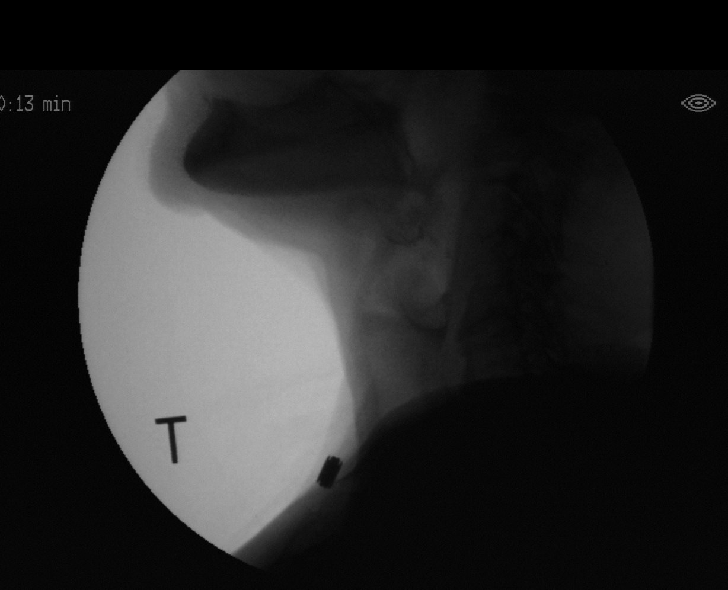

[Series 7: run · 1 of 134 frames shown (6 of 12)]
[frame 1/134]
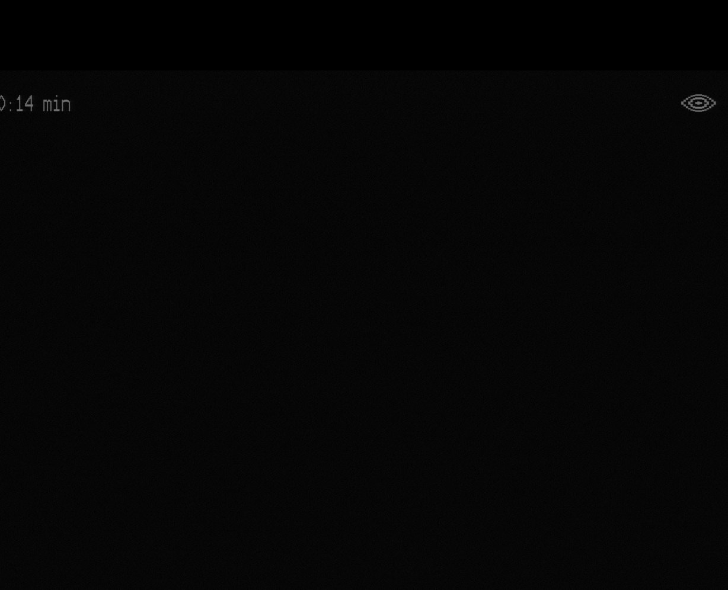

[Series 8: run · 2 of 47 frames shown (7 of 12)]
[frame 8/47]
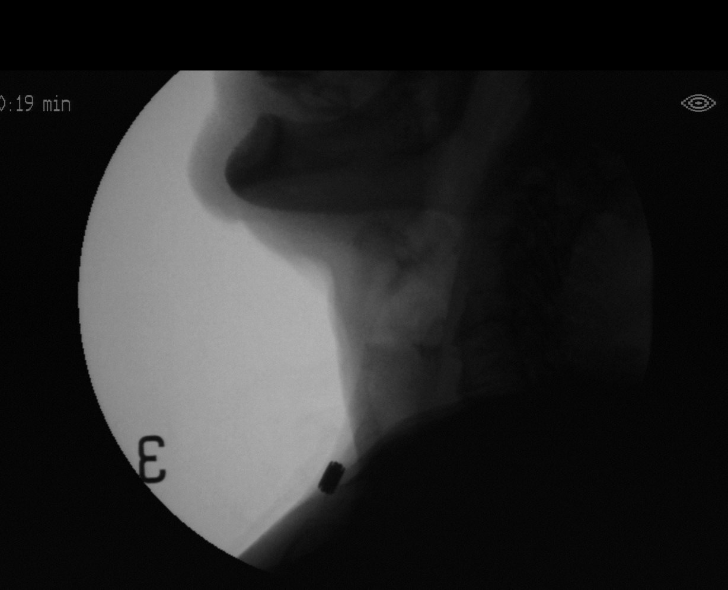
[frame 40/47]
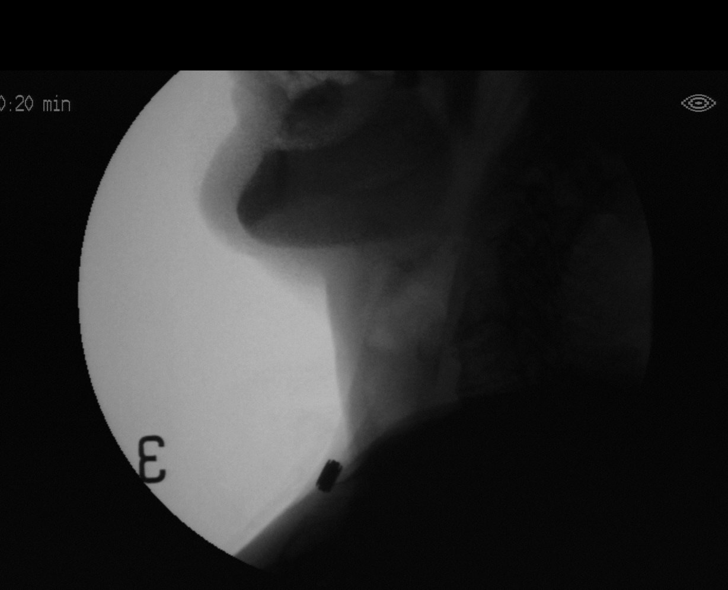

[Series 10: run · 1 of 332 frames shown (8 of 12)]
[frame 1/332]
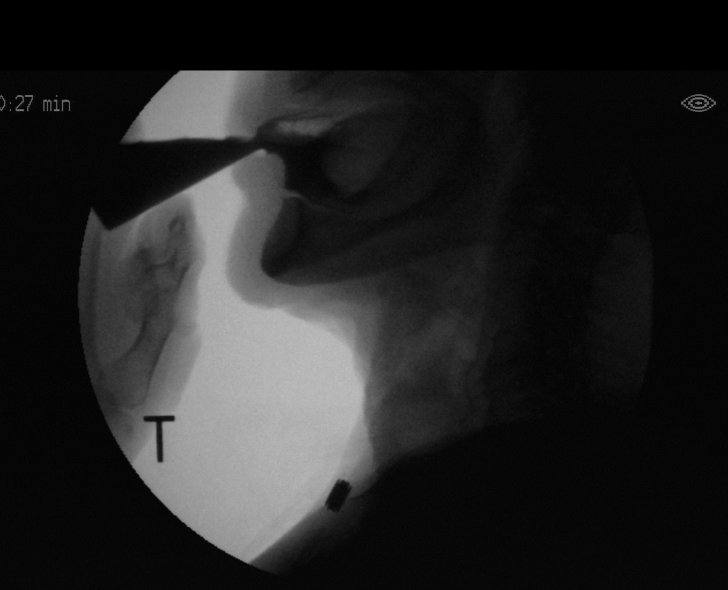

[Series 11: run · 1 of 158 frames shown (9 of 12)]
[frame 24/158]
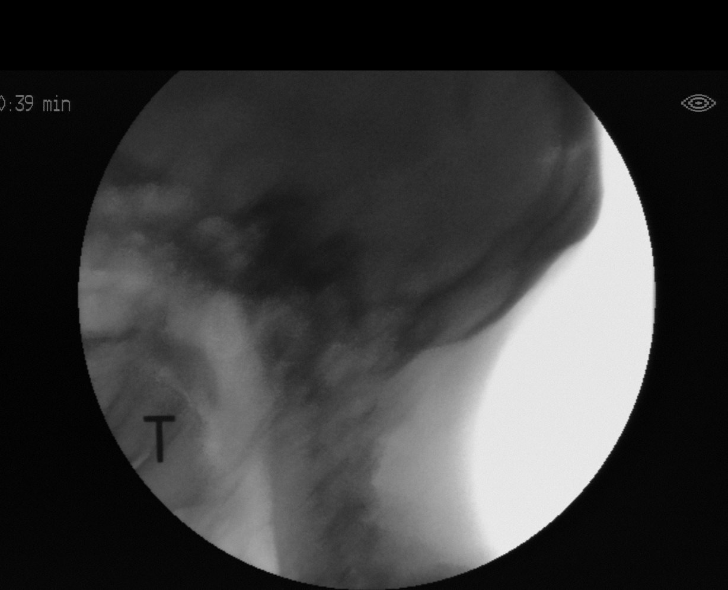

[Series 12: run · 1 of 203 frames shown (10 of 12)]
[frame 102/203]
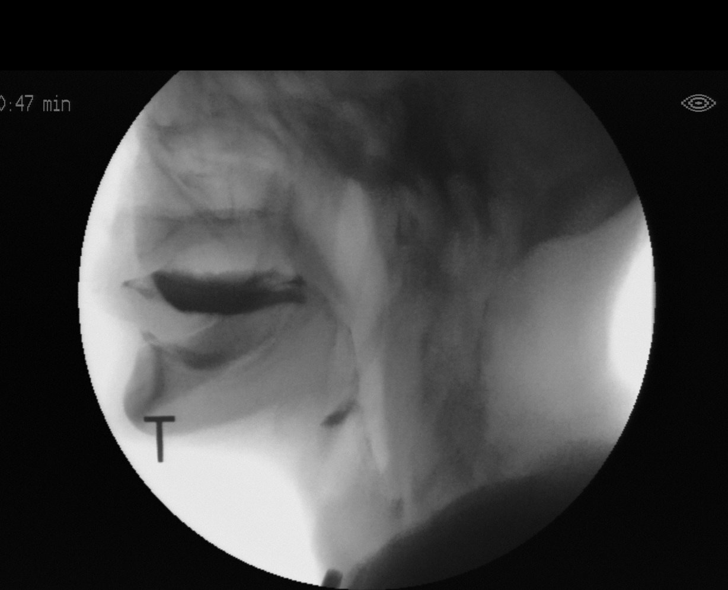

[Series 13: run · 1 of 170 frames shown (11 of 12)]
[frame 86/170]
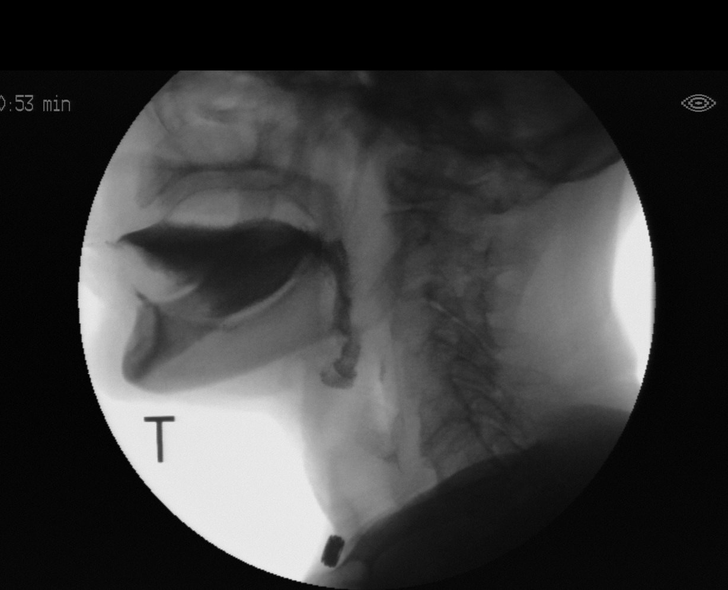

[Series 14: run · 1 of 158 frames shown (12 of 12)]
[frame 135/158]
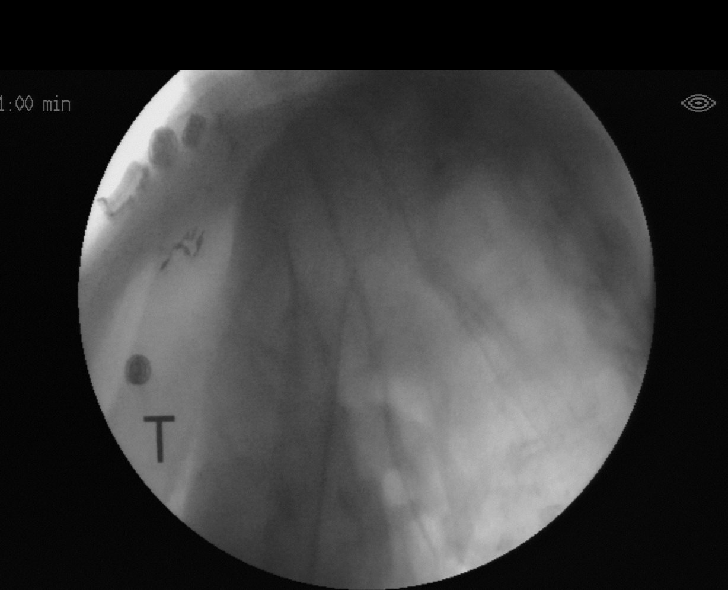

[13 of 24 positions shown; findings below may reference images not displayed]

FLUOROSCOPY FOR SWALLOWING FUNCTION STUDY:
Fluoroscopy was provided for swallowing function study, which was administered by a speech pathologist.  Final results and recommendations from this study are contained within the speech pathology report.

## 2020-05-12 DIAGNOSIS — R29898 Other symptoms and signs involving the musculoskeletal system: Secondary | ICD-10-CM | POA: Diagnosis not present

## 2020-05-12 DIAGNOSIS — J449 Chronic obstructive pulmonary disease, unspecified: Secondary | ICD-10-CM | POA: Diagnosis not present

## 2020-05-14 DIAGNOSIS — D696 Thrombocytopenia, unspecified: Secondary | ICD-10-CM | POA: Diagnosis not present

## 2020-05-14 DIAGNOSIS — T420X1A Poisoning by hydantoin derivatives, accidental (unintentional), initial encounter: Secondary | ICD-10-CM | POA: Diagnosis not present

## 2020-05-14 DIAGNOSIS — I10 Essential (primary) hypertension: Secondary | ICD-10-CM | POA: Diagnosis not present

## 2020-05-14 DIAGNOSIS — R569 Unspecified convulsions: Secondary | ICD-10-CM | POA: Diagnosis not present

## 2020-05-28 ENCOUNTER — Telehealth: Payer: Self-pay | Admitting: Neurology

## 2020-05-28 ENCOUNTER — Telehealth: Payer: Self-pay

## 2020-05-28 DIAGNOSIS — Q282 Arteriovenous malformation of cerebral vessels: Secondary | ICD-10-CM | POA: Diagnosis not present

## 2020-05-28 DIAGNOSIS — G629 Polyneuropathy, unspecified: Secondary | ICD-10-CM | POA: Diagnosis not present

## 2020-05-28 DIAGNOSIS — D693 Immune thrombocytopenic purpura: Secondary | ICD-10-CM | POA: Diagnosis not present

## 2020-05-28 DIAGNOSIS — R569 Unspecified convulsions: Secondary | ICD-10-CM | POA: Diagnosis not present

## 2020-05-28 NOTE — Telephone Encounter (Signed)
Patient's daughter [name] called today and spoke to me asking if [name] would consider accepting her mother [name] back as a patient since she was d/c from our practice as of July 9th of this year. I spoke to [name] and she agreed to remove the dismissal on the condition that if patient misses/no-shows for (1) appointment then she would be permanently dismissed from our practice without reconsideration in the future. [name] verbalized understanding and reassured me that she would make sure her mother did not miss any further appointments. [name] asked that the only phone # listed in the patient's chart is her cell phone # [phone].

## 2020-05-28 NOTE — Telephone Encounter (Signed)
Per email from [facility] to cancel dismissal,  patient has been unblocked from that location. 05/28/20  KLM

## 2020-06-06 ENCOUNTER — Other Ambulatory Visit: Payer: Self-pay

## 2020-06-06 ENCOUNTER — Ambulatory Visit: Payer: Medicare Other | Admitting: Neurology

## 2020-06-06 ENCOUNTER — Encounter: Payer: Self-pay | Admitting: Neurology

## 2020-06-06 VITALS — BP 110/70 | HR 77 | Resp 18 | Ht 67.0 in | Wt 138.0 lb

## 2020-06-06 DIAGNOSIS — G629 Polyneuropathy, unspecified: Secondary | ICD-10-CM | POA: Diagnosis not present

## 2020-06-06 DIAGNOSIS — G243 Spasmodic torticollis: Secondary | ICD-10-CM | POA: Diagnosis not present

## 2020-06-06 DIAGNOSIS — G40219 Localization-related (focal) (partial) symptomatic epilepsy and epileptic syndromes with complex partial seizures, intractable, without status epilepticus: Secondary | ICD-10-CM | POA: Diagnosis not present

## 2020-06-06 DIAGNOSIS — F419 Anxiety disorder, unspecified: Secondary | ICD-10-CM | POA: Diagnosis not present

## 2020-06-06 MED ORDER — XCOPRI 100 MG PO TABS
1.0000 | ORAL_TABLET | Freq: Every day | ORAL | 3 refills | Status: DC
Start: 2020-06-06 — End: 2020-08-29

## 2020-06-06 MED ORDER — LACOSAMIDE 200 MG PO TABS
200.0000 mg | ORAL_TABLET | Freq: Two times a day (BID) | ORAL | 3 refills | Status: DC
Start: 2020-06-06 — End: 2020-07-02

## 2020-06-06 MED ORDER — LORAZEPAM 1 MG PO TABS
ORAL_TABLET | ORAL | 5 refills | Status: DC
Start: 2020-06-06 — End: 2020-10-12

## 2020-06-06 MED ORDER — ESCITALOPRAM OXALATE 10 MG PO TABS
10.0000 mg | ORAL_TABLET | Freq: Every day | ORAL | 3 refills | Status: DC
Start: 2020-06-06 — End: 2020-10-12

## 2020-06-06 NOTE — Patient Instructions (Signed)
1. Check Dilantin level  2. Continue all your medications for now  3. Follow-up in 4 months, call for any changes   Seizure Precautions: 1. If medication has been prescribed for you to prevent seizures, take it exactly as directed.  Do not stop taking the medicine without talking to your doctor first, even if you have not had a seizure in a long time.   2. Avoid activities in which a seizure would cause danger to yourself or to others.  Don't operate dangerous machinery, swim alone, or climb in high or dangerous places, such as on ladders, roofs, or girders.  Do not drive unless your doctor says you may.  3. If you have any warning that you may have a seizure, lay down in a safe place where you can't hurt yourself.    4.  No driving for 6 months from last seizure, as per [facility].   Please refer to the following link on the [place] website for more information: http://www.epilepsyfoundation.org/answerplace/Social/driving/drivingu.cfm   5.  Maintain good sleep hygiene.  6.  Contact your doctor if you have any problems that may be related to the medicine you are taking.  7.  Call 911 and bring the patient back to the ED if:        A.  The seizure lasts longer than 5 minutes.       B.  The patient doesn't awaken shortly after the seizure  C.  The patient has new problems such as difficulty seeing, speaking or moving  D.  The patient was injured during the seizure  E.  The patient has a temperature over 102 F (39C)  F.  The patient vomited and now is having trouble breathing

## 2020-06-06 NOTE — Progress Notes (Signed)
NEUROLOGY FOLLOW UP OFFICE NOTE  [name] [id number] 1956/04/11  HISTORY OF PRESENT ILLNESS: I had the pleasure of seeing [name] in follow-up in the neurology clinic on 06/06/2020.  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 She is again accompanied by her daughter [name] who helps supplement the history today.  Records and images were personally reviewed where available. She has been on Dilantin for many years, with several hospital admissions for supratherapeutic levels. She reports that her home health PA checked Dilantin level and noted it was "3," so Dilantin level was increased from 100mg  in AM, 130mg  in PM to 100mg  in AM, 200mg  in PM. SHe is also on Xcopri 100mg  daily, Vimpat 200mg  BID, and Gabapentin 300mg  qhs (for neuropathy). They report an average of 1 seizure a week. She had 2 seizures last Thursday with jerking of one arm. They reports a lot has been going on, in addition she missed doses of Vimpat due to pharmacy issues. [name] administered prn Ativan. They report episodes around once a month where her left foot turns in, leading to a fall. [name] checks behind her on her pillbox to ensure compliance. She reports feeling dizzy all the time, dizzy when laying down. When she bent down for her shoe, she fell forward. The gabapentin helps with the neuropathy. She denies any headaches, diplopia. She is happy to report she has quit smoking.    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 She had been seeing epileptologist [name], records were reviewed and will be summarized as follows. In the past, seizures would start with headache and burning sensation of the left arm, which may be followed by left arm jerking. Over time, she  has not had any prior warning symptoms. She has had seizures where she is noted to have kicking and rocking movements of the pelvis with unresponsiveness. She was in the EMU in 10/2012 where 11 seizures were captured from the right posterior hemisphere/right inferior temporal region. Interictal EEG showed abundant right posterior temporal epileptiform discharges, frequent right anterior temporal epileptiform discharges, as well as occasional independent left anterior temporal epileptiform discharges. There was focal slowing over the right posterior hemisphere. MRI brain in 2014 showed right parietal/occipital encephalomalacia, right hippocampus smaller than left. PET scan in 2015 showed multiple areas of hypometabolism on the right, mostly corresponding to or adjacent to the structural abnormality. Ictal SPECT showed right hemisphere hypoperfusion. SISCOM showed right frontal region significant on the subtraction. MEG in 07/2013 showed spikes localizing to the right occipital, parietal, mesial temporal regions with a weak signal left mesial parietal. Concomitant EEG showed right posterior spikes. Functional mapping suggestive of left language lateralization. She has tried multiple AEDs in the past with various side effects or ineffective, and has been taking Dilantin 160mg  in AM, 200mg  in PM and Vimpat 200mg  BID with seizures around once a month. [facility] for left leg weakness and drowsiness. She was fishing with her boyfriend when she suddenly could not move her left leg and almost fell. She states that her boyfriend told her that she was shaking and had a seizure while they were fishing. She was brought to [facility] where Dilantin level was 33.8. She was adamant that she has been taking her medications  as instructed and did not take more than prescribed, no new medications taken. She had an MRI brain without contrast which I personally reviewed, no acute changes seen. Large volume right  parieto-occipital encephalomalacia was unchanged. Right hippocampus is smaller. She continued to report weakness and had an MRI cervical and lumbar spine which did not show any myelopathy, there was prominent left-sided facet hypertrophy at C4-5 with resultant moderate left C5 foraminal stenosis, degenerative disc osteophyte at C5-6 with severe right C6 foraminal stenosis, and mild spinal stenosis at C5-6 and C6-7, chronic borderline to mild multifactorial spinal stenosis at L2-3 through L4-5 with no convincing lateral recess or foraminal stenosis. She continued to report burning pain in her legs and was started on low dose gabapentin 300mg  qhs with no side effects. Dilantin was held in the hospital, she was discharged on a lower dose, but states that she has been taking her pre-hospital dose of 160mg  in AM, 200mg  in PM but has had 4 seizures since hospital discharge 2 weeks ago, which is unusual. She had 2 seizures in one day in the car with her mother-in-law, her mother-in-law had to pull over. The next day she had 2 at home, her daughter found her in the bathroom. She has not been sleeping well the past few days. She has a headache after the seizures. Aside from the convulsions, she has episodes where a feeling comes over her left side, from the arm down her leg, then she is weaker after. She denies any jerking. She is quite anxious in the office today and perseverates on her symptoms. She states she "stays nervous." She reports taking prn Ativan in the past, but discontinued at [facility]. She repeatedly states her feet are numb and they "feel like they are grabbed." She feels gabapentin has helped some with this.   Prior AEDs: phenobarbital (ineffective, mood issues), Depakote (multiple trials, ineffective), Keppra (ineffective), Zonisamide (side effects on brief trial of higher dose than prescribed - 500mg  instead of 200mg ), Onfi (mouth sores), Tegretol (ineffective), Lamictal (nausea, dizziness, malaise),  Felbatol (nausea, dizziness, malaise), Topamax (ineffective), Tigabine (tried per notes, details unclear), Neurontin (ineffective, nausea), Lyrica (leg pain reported on 25mg  daily, tingling), Briviact (drowsiness), Fycompa  Epilepsy Risk Factors:  Right parieto-occipital AVM s/p rupture and hemorrhage in 1988, s/p craniectomy. Otherwise she had a normal birth and early development.  There is no history of febrile convulsions, CNS infections such as meningitis/encephalitis, or family history of seizures.  Diagnostic Data:  MRI brain without contrast 05/2018 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  EEG done 06/2018 showed right posterior temporal slowing, occasional epileptiform discharges over the right posterior temporal region, breach artifact in this region.   MRI cervical, thoracic, and lumbar spine done 07/2018 showed no convincing cervical or thoracic spinal cord abnormality. Mild degenerative cervical spinal stenosis at C5-6 and C6-7 with moderate or severe bilateral foraminal stenosis; no thoracic spinal or foraminal stenosis; stable borderline to mild lumbar spinal stenosis  MRI lumbar spine in 2019 showed borderline to mild multifactorial spinal stenosis at L2-L3 and L3-L4 related to circumferential disc bulge and posterior element Hypertrophy.  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    PAST MEDICAL HISTORY: Past Medical History:  Diagnosis Date  . Asthma   . Cerebral hemorrhage ([place]) 1989  . Chronic back pain   . COPD (chronic obstructive pulmonary disease) ([place])   .  DDD (degenerative disc disease) 06/12/2013  . DVT (deep venous thrombosis) ([place])    "she's had several since 1990"  . GERD (gastroesophageal reflux disease)   . Headache    "usually around time when she's had a seizure"  . History of  blood transfusion    "when she was a baby"  . History of stomach ulcers   . Kidney stones   . Neuropathy   . Seizures ([place])    "because of her brain surgery"  . Stroke [facility]) [phone]   family denies residual on 11/09/2014  . Tunnel vision    "since brain OR"    MEDICATIONS: Current Outpatient Medications on File Prior to Visit  Medication Sig Dispense Refill  . acetaminophen (TYLENOL) 500 MG tablet Take 1,000 mg by mouth every 6 (six) hours as needed for mild pain.    Marland Kitchen albuterol (VENTOLIN HFA) 108 (90 Base) MCG/ACT inhaler Inhale 1 puff into the lungs every 4 (four) hours as needed for wheezing or shortness of breath.     . Cenobamate (XCOPRI) 100 MG TABS Take 1 tablet by mouth daily. 90 tablet 1  . cholecalciferol (VITAMIN D3) 25 MCG (1000 UNIT) tablet Take 1 tablet (1,000 Units total) by mouth daily. 90 tablet 3  . [facility] B-12 500 MCG tablet TAKE 2 TABLETS BY MOUTH EVERY DAY 100 tablet 0  . DILANTIN 30 MG ER capsule Take 1 capsule (30 mg total) by mouth in the morning. Along with 100mg  capsule (Patient taking differently: Take 30 mg by mouth in the morning. Along with 100mg  capsule=130 mg) 90 capsule 3  . escitalopram (LEXAPRO) 10 MG tablet Take 1 tablet (10 mg total) by mouth daily. 90 tablet 3  . gabapentin (NEURONTIN) 300 MG capsule Take 1 capsule every night 90 capsule 3  . lacosamide (VIMPAT) 200 MG TABS tablet Take 1 tablet (200 mg total) by mouth 2 (two) times daily. 180 tablet 3  . LORazepam (ATIVAN) 1 MG tablet Take 1 tablet as needed for seizure. Do not take more than 2 in 24 hours. (Patient taking differently: Take 1 mg by mouth at bedtime. Do not take more than 2 in 24 hours.) 10 tablet 5  . Multiple Vitamins-Minerals (ZINC PO) Take 1 tablet by mouth daily.    . phenytoin (DILANTIN) 100 MG ER capsule 100mg  in AM, 130mg  in PM (Patient taking differently: Take 100 mg by mouth 2 (two) times daily. Take along with 30 mg=130 mg in AM and Take 100 mg in PM) 180 capsule 3  . predniSONE  (DELTASONE) 20 MG tablet Take 20 mg by mouth daily with breakfast.      Current Facility-Administered Medications on File Prior to Visit  Medication Dose Route Frequency Provider Last Rate Last Admin  . botulinum toxin Type A (BOTOX) injection 200 Units  200 Units Intramuscular Once Tat, Rebecca S, DO        ALLERGIES: Allergies  Allergen Reactions  . Zonisamide Other (See Comments)    Numbness and tingling over whole body  . Chocolate Flavor Other (See Comments)    Trigger for Seizure  . Codeine Nausea And Vomiting  . Keppra [Levetiracetam] Other (See Comments)    Causes seizures  . Pregabalin Nausea And Vomiting  . Olive Oil Rash    FAMILY HISTORY: Family History  Problem Relation Age of Onset  . Heart attack Mother        4 MIs, started in her 77s, also vaginal cancer and colon cancer  . Cancer Mother   .  Heart attack Father        Died suddenly age 10 - autopsy revealed heart attack her patient  . Cancer Brother        Sinus cancer    SOCIAL HISTORY: Social History   Socioeconomic History  . Marital status: Divorced    Spouse name: Not on file  . Number of children: 3  . Years of education: Not on file  . Highest education level: Not on file  Occupational History  . Not on file  Tobacco Use  . Smoking status: Former Smoker    Packs/day: 0.50    Years: 20.00    Pack years: 10.00    Types: Cigarettes    Quit date: 10/26/2017    Years since quitting: 2.6  . Smokeless tobacco: Never Used  . Tobacco comment: Previously smoked 2 ppd, down to 4 cigs/day  Vaping Use  . Vaping Use: Never used  Substance and Sexual Activity  . Alcohol use: No  . Drug use: No  . Sexual activity: Never    Birth control/protection: Post-menopausal  Other Topics Concern  . Not on file  Social History Narrative   Pt lives in 2 story home with her daughter, daughter's family and pt's mother-in-law   Has 2 living children / 1 deceased   10-22-22 grade education   Worked for [employer]  until 1998 when she became disabled.    Right handed   Social Determinants of Health   Financial Resource Strain:   . Difficulty of Paying Living Expenses: Not on file  Food Insecurity:   . Worried About Charity fundraiser in the Last Year: Not on file  . Ran Out of Food in the Last Year: Not on file  Transportation Needs:   . Lack of Transportation (Medical): Not on file  . Lack of Transportation (Non-Medical): Not on file  Physical Activity:   . Days of Exercise per Week: Not on file  . Minutes of Exercise per Session: Not on file  Stress:   . Feeling of Stress : Not on file  Social Connections:   . Frequency of Communication with Friends and Family: Not on file  . Frequency of Social Gatherings with Friends and Family: Not on file  . Attends Religious Services: Not on file  . Active Member of Clubs or Organizations: Not on file  . Attends Archivist Meetings: Not on file  . Marital Status: Not on file  Intimate Partner Violence:   . Fear of Current or Ex-Partner: Not on file  . Emotionally Abused: Not on file  . Physically Abused: Not on file  . Sexually Abused: Not on file     PHYSICAL EXAM: Vitals:   06/06/20 1606  BP: 110/70  Pulse: 77  Resp: 18  SpO2: 95%   General: No acute distress. Torticollis again noted with head bent to the right, side to side head tremor Head:  Normocephalic/atraumatic Skin/Extremities: No rash, no edema Neurological Exam: alert and oriented to person, place, and time. No aphasia or dysarthria. Fund of knowledge is appropriate.  Recent and remote memory are impaired.  Attention and concentration are normal.   Cranial nerves: Pupils equal, round. Extraocular movements intact with no nystagmus. Visual fields full.  No facial asymmetry.  Motor: Bulk and tone normal, muscle strength 5/5 throughout with no pronator drift.   Finger to nose testing intact.  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 She has tried multiple AEDs in the past with various side effects. She has had several admissions for Dilantin toxicity, and again presents today back on higher dose Dilantin 100mg  in AM, 200mg  PM. She is also on Xcopri 100mg  daily and Vimpat 200mg  BID. They continue to report an average of one seizure a week, as well as dizziness. Repeat Dilantin level will be done. Continue current AEDs for now, we had previously discussed intractable epilepsy and looking into surgical options, however at this time she is not interested. We may increase Xcopri to 150mg  daily, would need to monitor Dilantin levels closely as Dilantin dose typically needs to be reduced however attempts to reduce dose in the past have led to difficulties. She does not drive. 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 Follow-up in 4 months, they know to call for any changes.    Thank you for allowing me to participate in her care.  Please do not hesitate to call for any questions or concerns.   Ellouise Newer, M.D.   CC: Dr. Maudie Mercury

## 2020-06-07 ENCOUNTER — Other Ambulatory Visit: Payer: Medicare Other

## 2020-06-08 ENCOUNTER — Other Ambulatory Visit (INDEPENDENT_AMBULATORY_CARE_PROVIDER_SITE_OTHER): Payer: Medicare Other

## 2020-06-08 ENCOUNTER — Other Ambulatory Visit: Payer: Self-pay

## 2020-06-08 DIAGNOSIS — G40219 Localization-related (focal) (partial) symptomatic epilepsy and epileptic syndromes with complex partial seizures, intractable, without status epilepticus: Secondary | ICD-10-CM

## 2020-06-08 DIAGNOSIS — G243 Spasmodic torticollis: Secondary | ICD-10-CM | POA: Diagnosis not present

## 2020-06-08 DIAGNOSIS — G629 Polyneuropathy, unspecified: Secondary | ICD-10-CM

## 2020-06-08 DIAGNOSIS — F419 Anxiety disorder, unspecified: Secondary | ICD-10-CM

## 2020-06-09 LAB — PHENYTOIN LEVEL, TOTAL: Phenytoin, Total: 14.7 mg/L (ref 10.0–20.0)

## 2020-06-11 ENCOUNTER — Telehealth: Payer: Self-pay

## 2020-06-11 NOTE — Telephone Encounter (Signed)
-----   Message from [name], MD sent at 06/11/2020 11:42 AM EST ----- Pls let daughter know [name]'s Dilantin level is okay, within range. Continue on same dose she is taking now, confirm again that she is taking Dilantin 100mg : 1 in AM, 2 in PM. Have daughter take away the 30mg  capsules so it is not confusing. Thanks!

## 2020-06-11 NOTE — Telephone Encounter (Signed)
Spoke with pt daughter and informed her that [name]'s Dilantin level is okay, within range. Continue on same dose she is taking now, confirm again that she is taking Dilantin 100mg : 1 in AM, 2 in PM. Have daughter take away the 30mg  capsules so it is not confusing

## 2020-06-12 DIAGNOSIS — J449 Chronic obstructive pulmonary disease, unspecified: Secondary | ICD-10-CM | POA: Diagnosis not present

## 2020-06-12 DIAGNOSIS — R29898 Other symptoms and signs involving the musculoskeletal system: Secondary | ICD-10-CM | POA: Diagnosis not present

## 2020-06-21 ENCOUNTER — Other Ambulatory Visit: Payer: Self-pay

## 2020-06-21 ENCOUNTER — Telehealth: Payer: Self-pay

## 2020-06-21 DIAGNOSIS — Z79899 Other long term (current) drug therapy: Secondary | ICD-10-CM

## 2020-06-21 NOTE — Telephone Encounter (Signed)
-----   Message from [name], MD sent at 06/21/2020  3:46 PM EST ----- Regarding: RE: Dilantin level Yes she will need to have repeat level done first thing in AM before first dose. Thanks! ----- Message ----- From: Wilder Glade, LPN Sent: 29/92/4268   1:18 PM EST To: [name], MD Subject: RE: Dilantin level                             Pt daughter is out of town the nurse came to the house to do the lab work, Brookelin had already taken her medication when they did that lab work. If you would like a repeat before she has taken her medicaiton her daughter will call the home health nurse to have them come out before she takes her meds at 37 am, daughter will be back in town on Sat,  ----- Message ----- From: [name], MD Sent: 06/21/2020  12:47 PM EST To: Wilder Glade, LPN Subject: Dilantin level                                 Can you pls call her daughter and let her know the Dilantin level is elevated again, was the blood drawn first thing in the morning before she took her morning dose? Thanks ----- Message ----- From: Leeroy Bock Sent: 06/20/2020   2:43 PM EST To: [name], MD

## 2020-06-21 NOTE — Telephone Encounter (Signed)
Spoke with pt daughter she is going to call home health nurse to see if they will come out before pt takes her medication to do the labs,

## 2020-06-22 ENCOUNTER — Telehealth: Payer: Self-pay

## 2020-06-22 DIAGNOSIS — I10 Essential (primary) hypertension: Secondary | ICD-10-CM | POA: Diagnosis not present

## 2020-06-22 DIAGNOSIS — R531 Weakness: Secondary | ICD-10-CM | POA: Diagnosis not present

## 2020-06-22 DIAGNOSIS — R569 Unspecified convulsions: Secondary | ICD-10-CM | POA: Diagnosis not present

## 2020-06-22 NOTE — Telephone Encounter (Signed)
Spoke with pt daughter [name] informed her that repeat level is within range, continue on same dose she verbalized understanded

## 2020-06-22 NOTE — Telephone Encounter (Signed)
-----   Message from [name], MD sent at 06/22/2020  3:10 PM EST ----- Pls let daughter know the repeat level is within range, continue on same dose. Thanks ----- Message ----- From: Virl Son Sent: 06/22/2020   2:11 PM EST To: [name], MD

## 2020-07-02 ENCOUNTER — Other Ambulatory Visit: Payer: Self-pay

## 2020-07-02 IMAGING — DX DG CHEST 2V
2 series · 2 of 2 positions shown · non-contrast
Comparison: Body CT 03/24/2018

CLINICAL DATA: Central chest pain radiating to left arm.

EXAM:
CHEST - 2 VIEW

[chest pa]
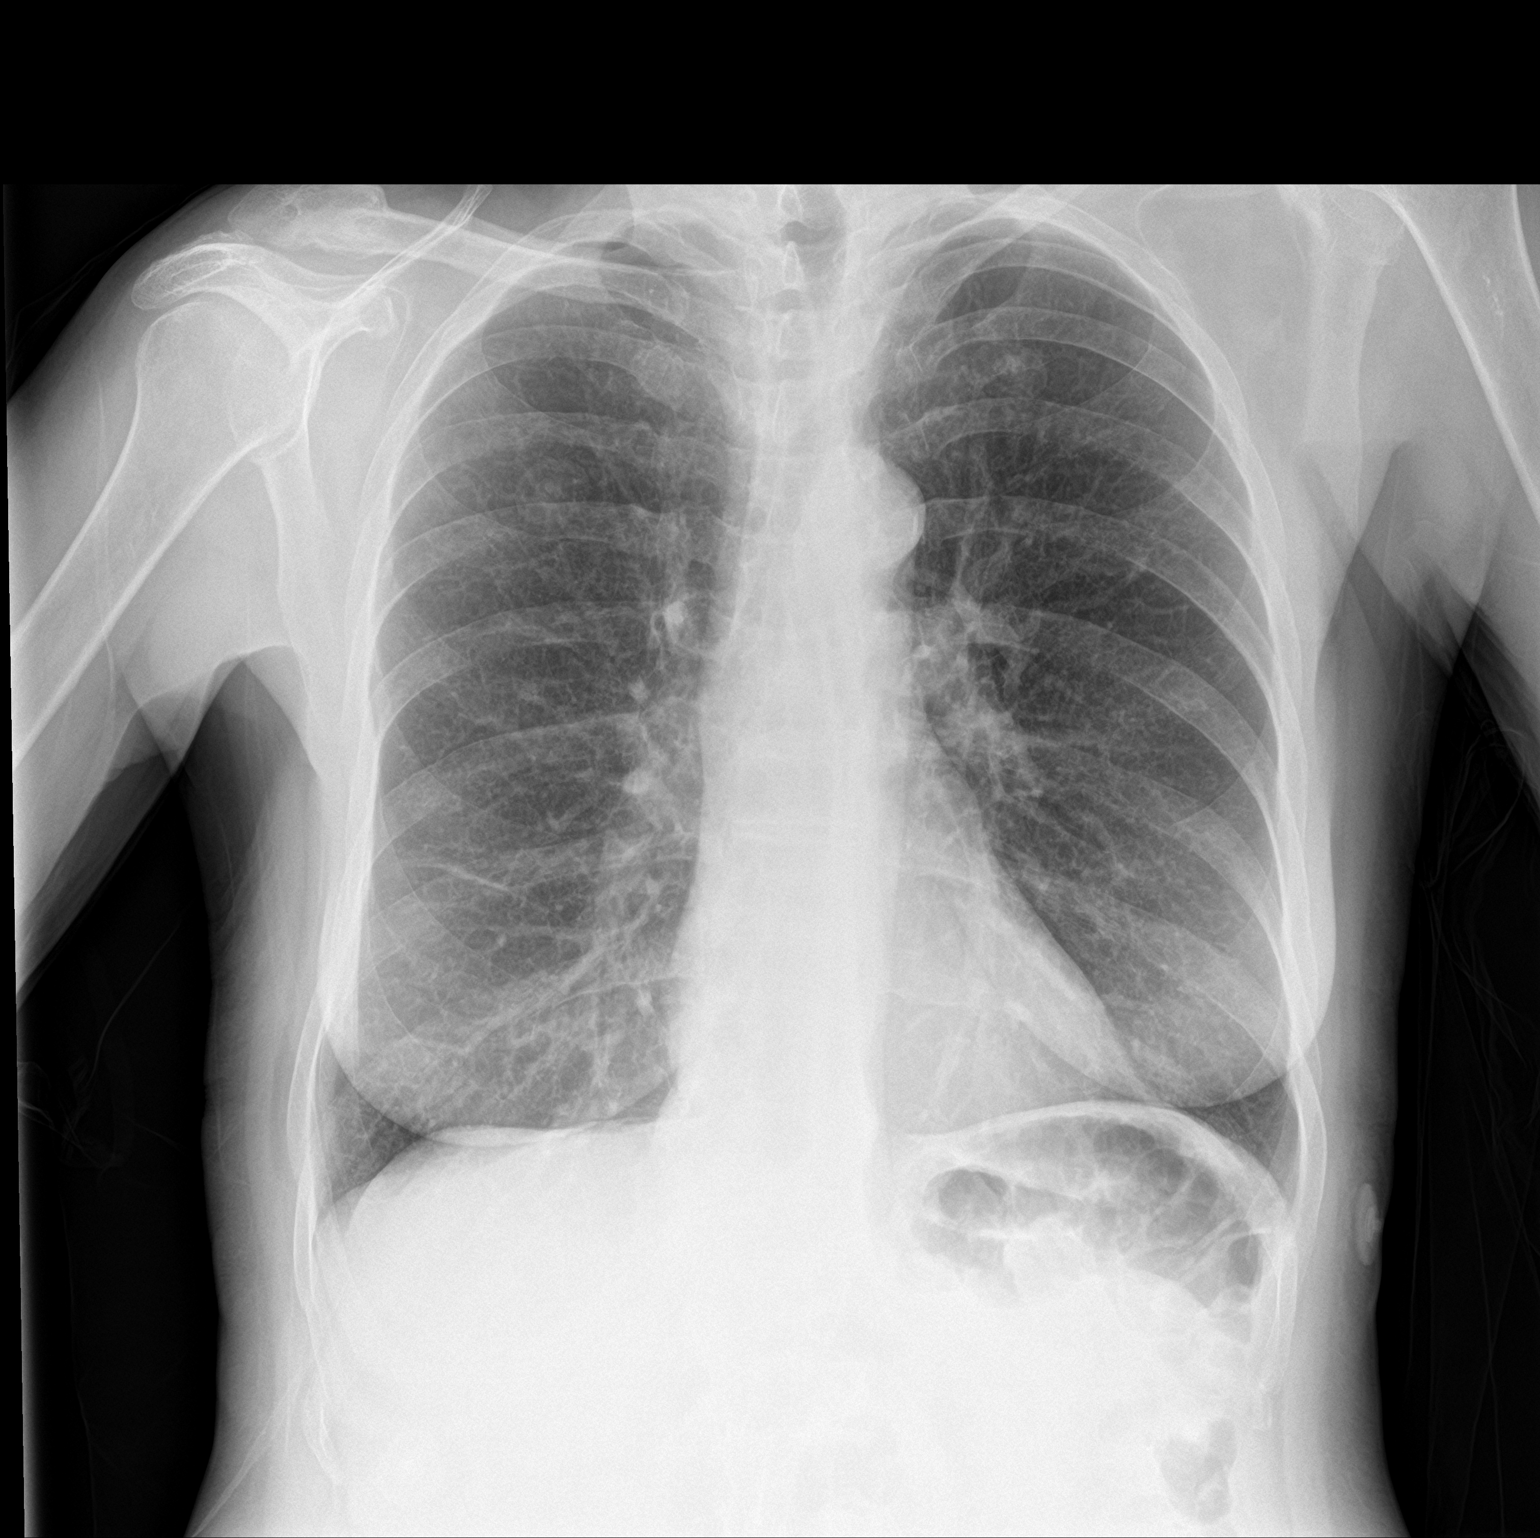

[chest lat]
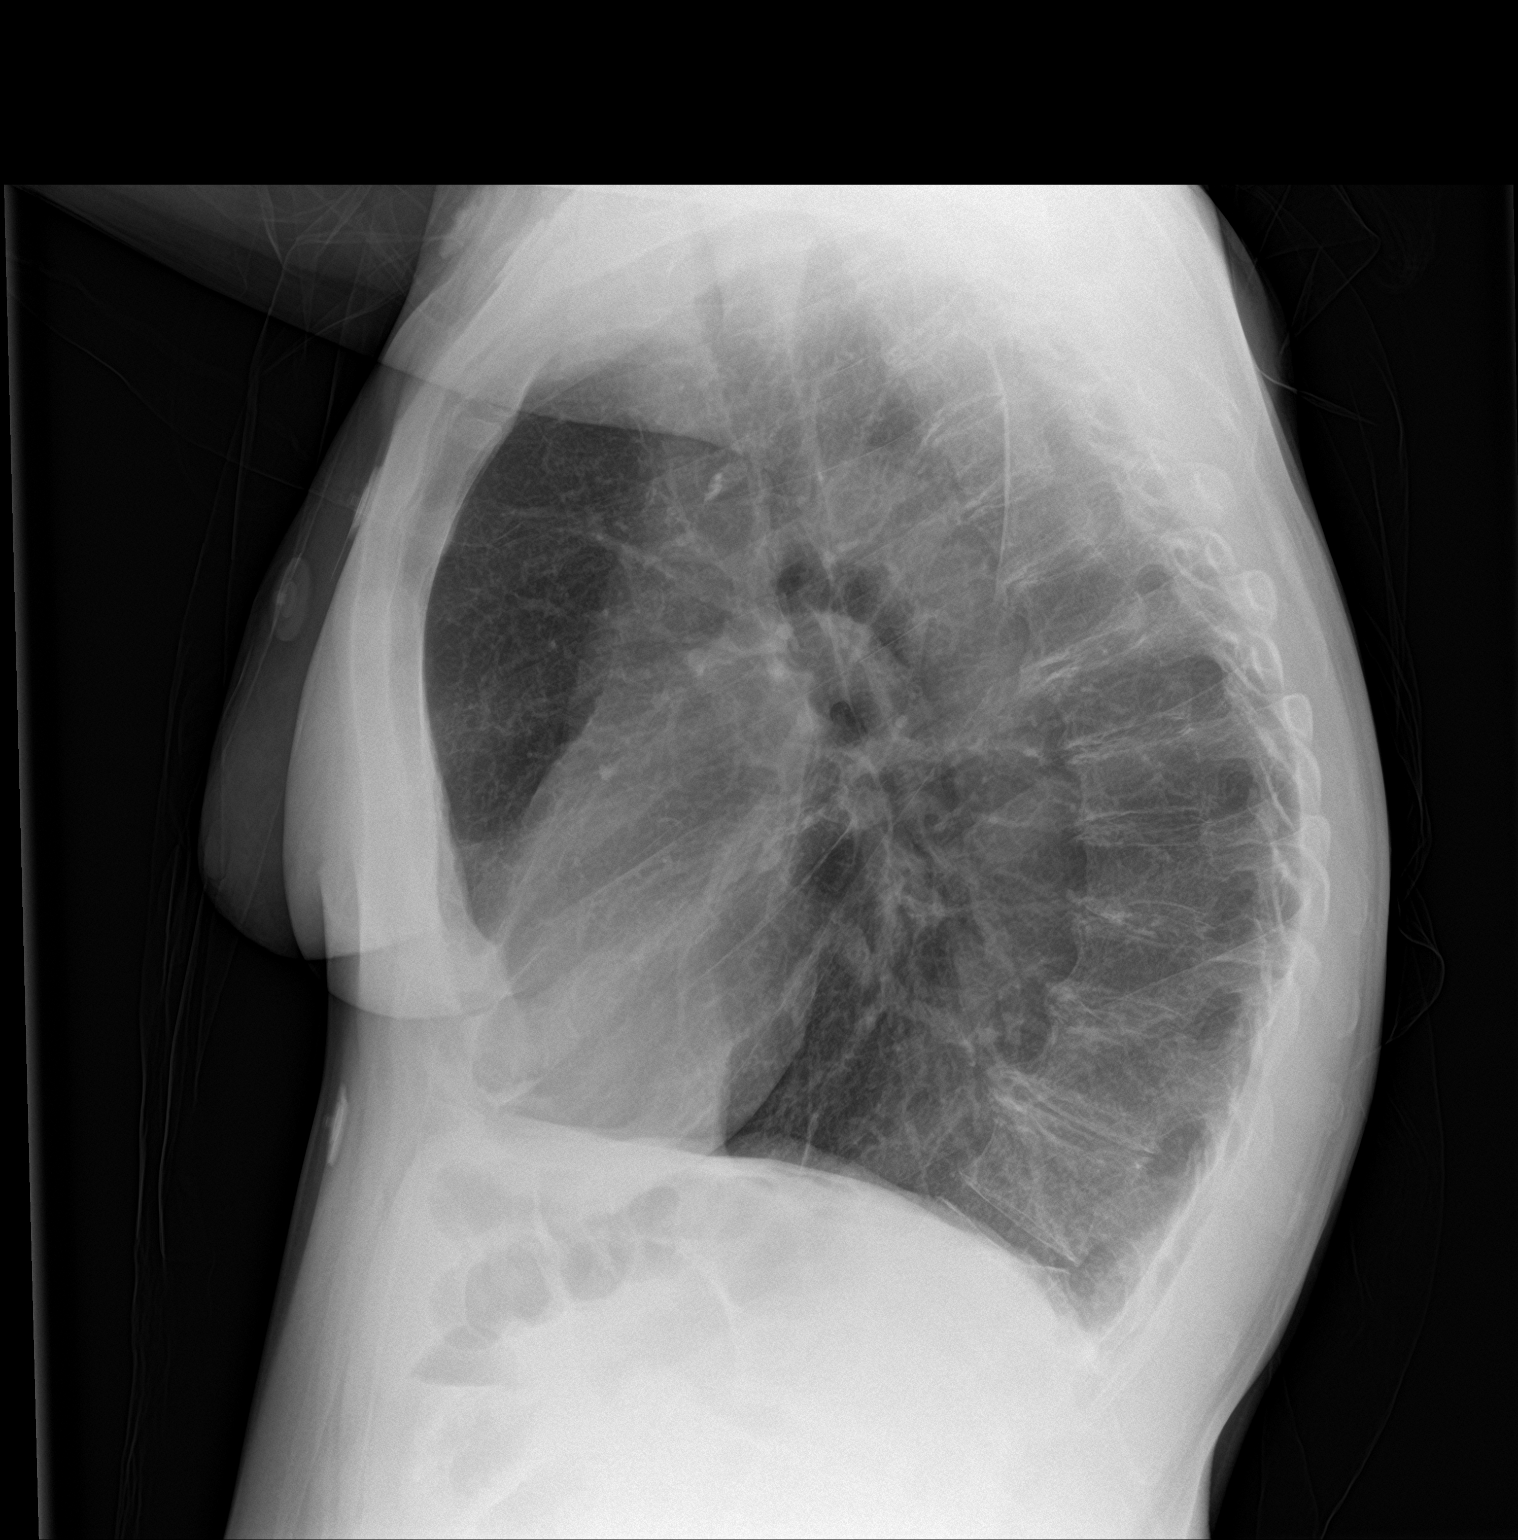

[2 of 2 positions shown; findings below may reference images not displayed]

FINDINGS: Cardiomediastinal silhouette is normal. Mediastinal contours appear
intact. Calcific atherosclerotic disease the aorta.

There is no evidence of focal airspace consolidation, pleural
effusion or pneumothorax. Minimal right middle lobe atelectasis.
Emphysematous changes and hyperinflation of the lungs.

Osseous structures are without acute abnormality. Exaggerated
thoracic kyphosis. Posttraumatic deformity of the right clavicle.
Soft tissues are grossly normal.
IMPRESSION: Emphysematous changes and hyperinflation of the lungs.

Improved right middle lobe atelectasis.

Calcific atherosclerotic disease of the aorta.

## 2020-07-02 IMAGING — CT CT ANGIO CHEST
3 of 9 series · 18 of 36 positions shown · IV contrast (iopamidol)
Comparison: Radiographs of same day.  CT scan March 24, 2018.

CLINICAL DATA: Chest pain.

EXAM:
CT ANGIOGRAPHY CHEST WITH CONTRAST
TECHNIQUE: Multidetector CT imaging of the chest was performed using the
standard protocol during bolus administration of intravenous
contrast. Multiplanar CT image reconstructions and MIPs were
obtained to evaluate the vascular anatomy.
CONTRAST:  100mL DA0KJG-4DI IOPAMIDOL (DA0KJG-4DI) INJECTION 76%

[Series 7: thins · axial · 0.70mm/px · z∈[+1135,+1403]mm · 14 of 310 slices shown]
[im 21/310  lung]
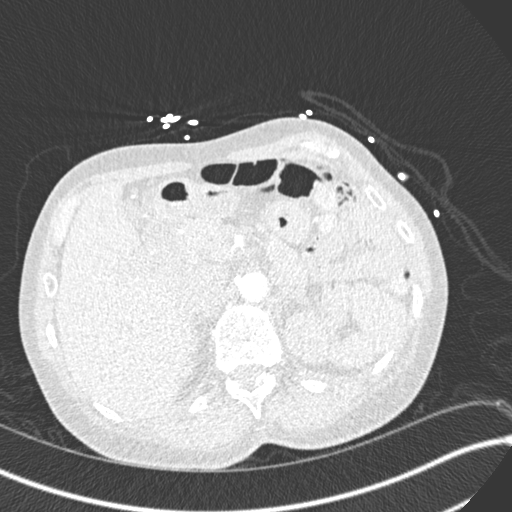
[im 42/310  mediastinal]
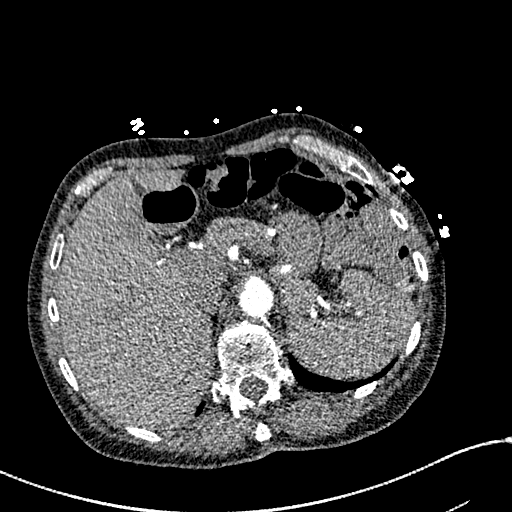
[im 62/310  lung]
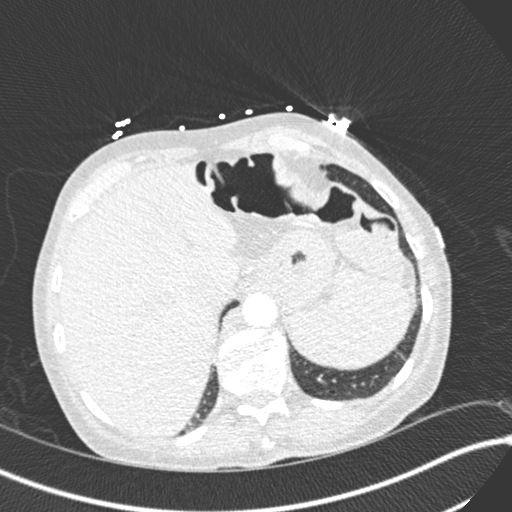
[im 83/310  mediastinal]
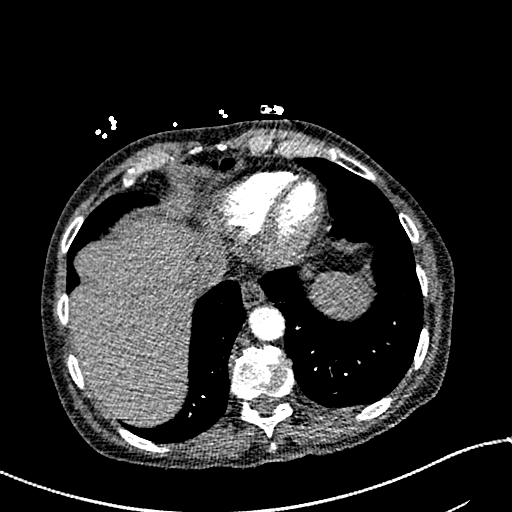
[im 104/310  lung]
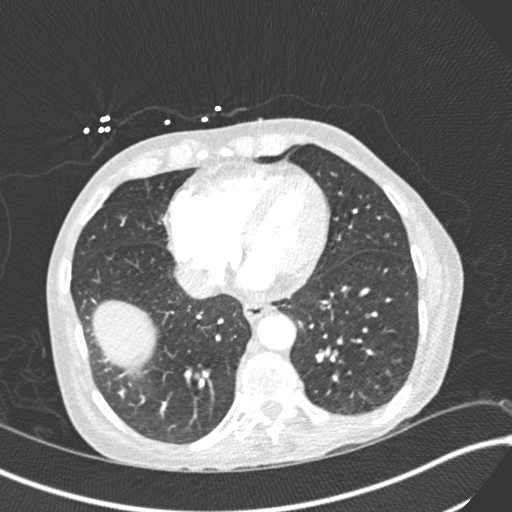
[im 124/310  mediastinal]
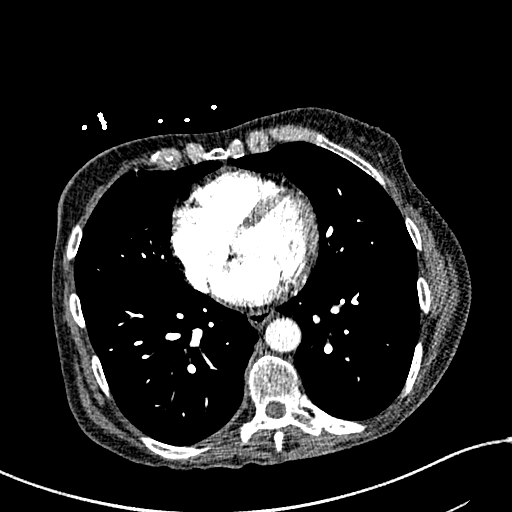
[im 145/310  lung]
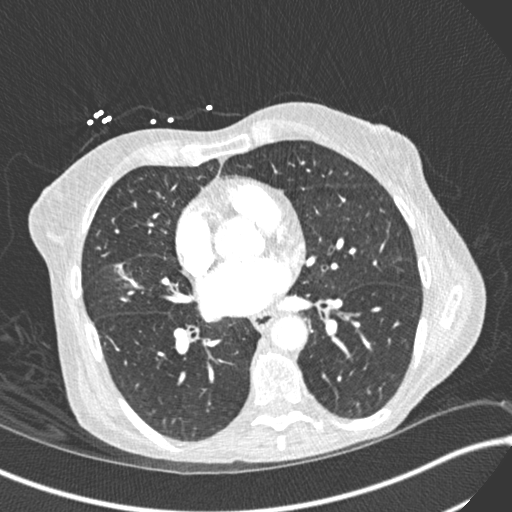
[im 165/310  mediastinal]
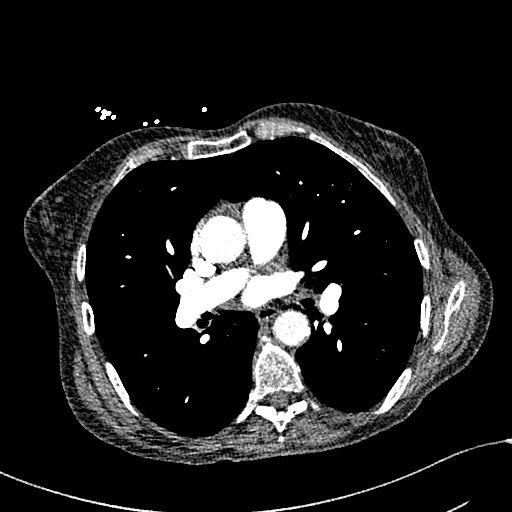
[im 186/310  lung]
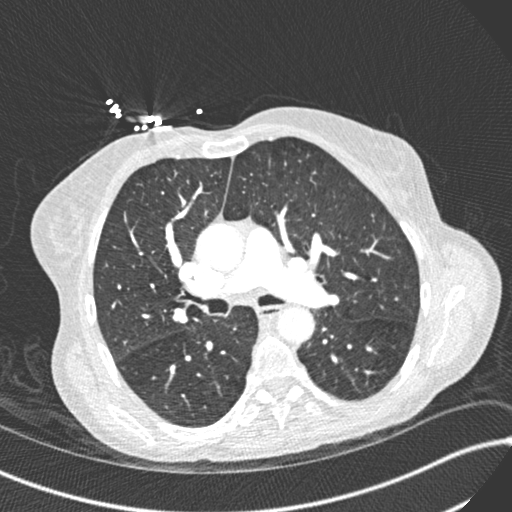
[im 207/310  mediastinal]
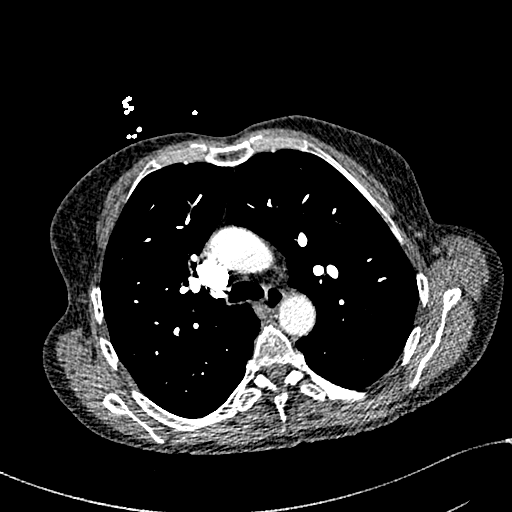
[im 227/310  lung]
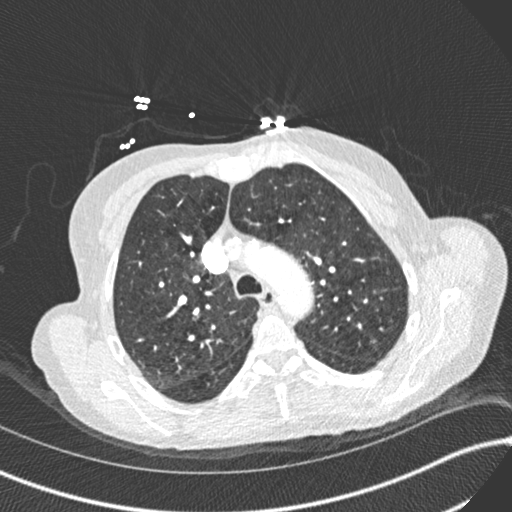
[im 248/310  mediastinal]
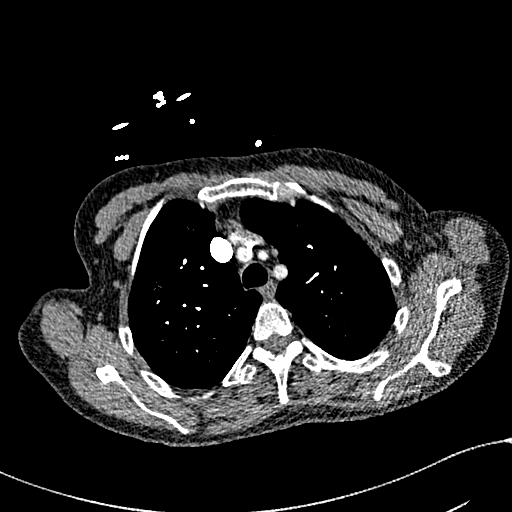
[im 268/310  lung]
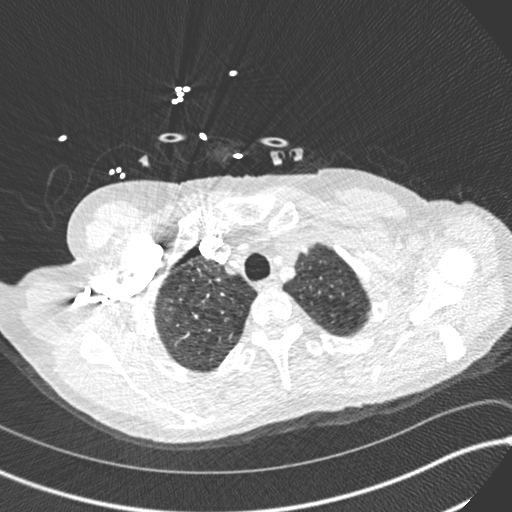
[im 289/310  mediastinal]
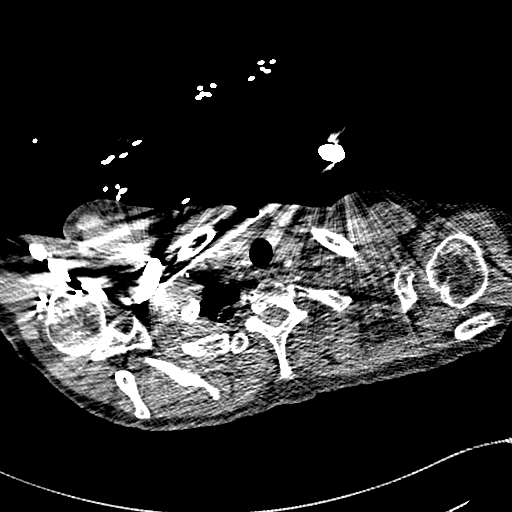

[Series 8: lung · axial · 0.70mm/px · z∈[+1211,+1352]mm · 3 of 95 slices shown]
[im 24/95  mediastinal]
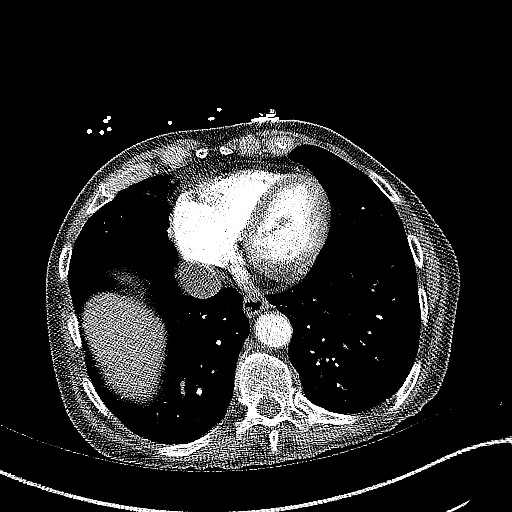
[im 48/95  mediastinal]
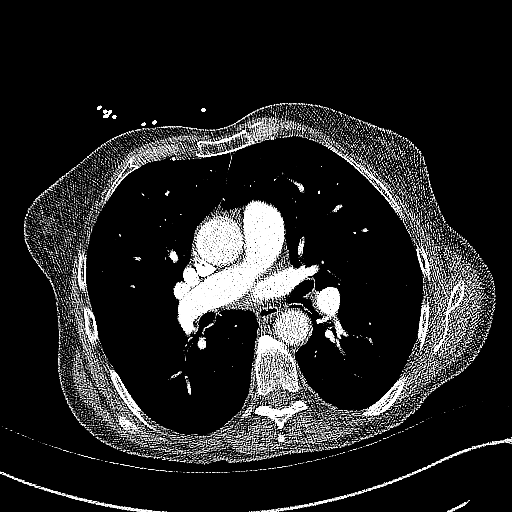
[im 71/95  mediastinal]
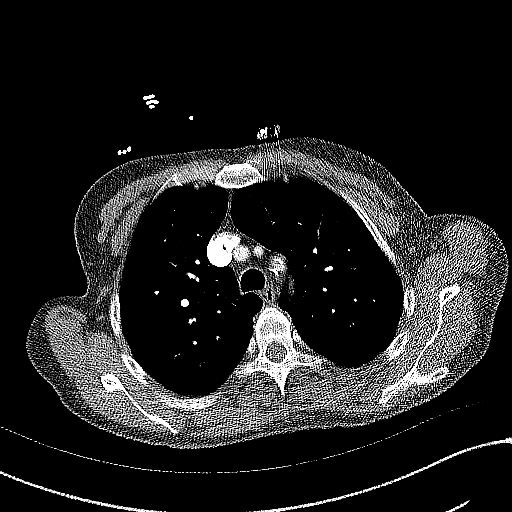

[Series 9: coronal mpr · coronal · 0.61mm/px · 1 of 149 slices shown]
[im 75/149  mediastinal]
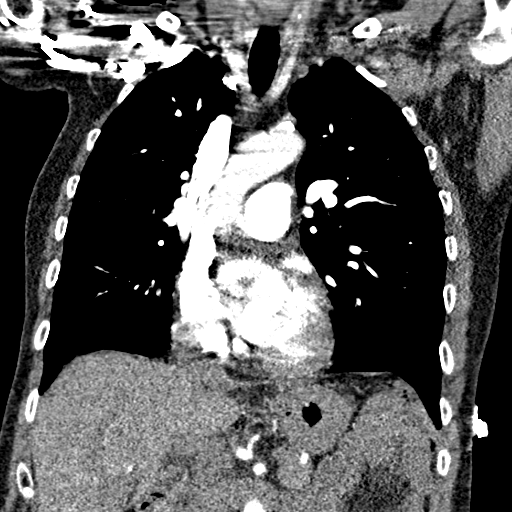

[18 of 36 positions shown; findings below may reference images not displayed]

FINDINGS: Cardiovascular: Satisfactory opacification of the pulmonary arteries
to the segmental level. No evidence of pulmonary embolism. Normal
heart size. No pericardial effusion. Atherosclerosis of thoracic
aorta is noted without aneurysm or dissection.

Mediastinum/Nodes: No enlarged mediastinal, hilar, or axillary lymph
nodes. Thyroid gland, trachea, and esophagus demonstrate no
significant findings.

Lungs/Pleura: Lungs are clear. No pleural effusion or pneumothorax.

Upper Abdomen: No acute abnormality.

Musculoskeletal: No chest wall abnormality. No acute or significant
osseous findings.

Review of the MIP images confirms the above findings.
IMPRESSION: No definite evidence of pulmonary embolus. No acute abnormality seen
in the chest.

Aortic Atherosclerosis ([id number]).

## 2020-07-02 MED ORDER — LACOSAMIDE 200 MG PO TABS
200.0000 mg | ORAL_TABLET | Freq: Two times a day (BID) | ORAL | 0 refills | Status: DC
Start: 2020-07-02 — End: 2020-10-08

## 2020-07-03 IMAGING — CT CT HEAD W/O CM
4 series · 15 of 47 positions shown, 17 images · non-contrast
Comparison: CT 03/19/2018

CLINICAL DATA: Ataxia.

EXAM:
CT HEAD WITHOUT CONTRAST
TECHNIQUE: Contiguous axial images were obtained from the base of the skull
through the vertex without intravenous contrast.

[Series 3: head without · axial · non-contrast · 0.51mm/px · z∈[-108,+12]mm · 7 of 34 slices shown, 9 images]
[im 5/34  brain]
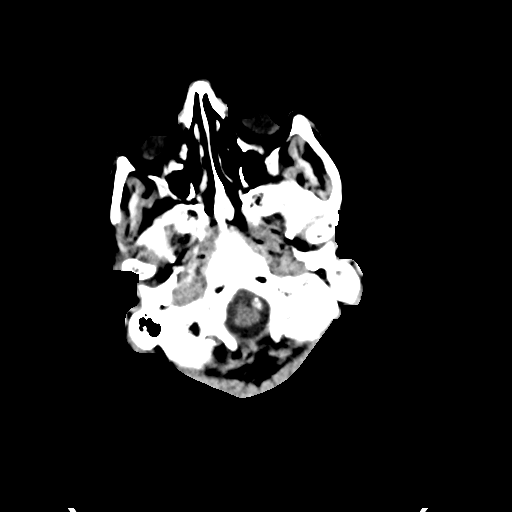
[im 5/34  bone]
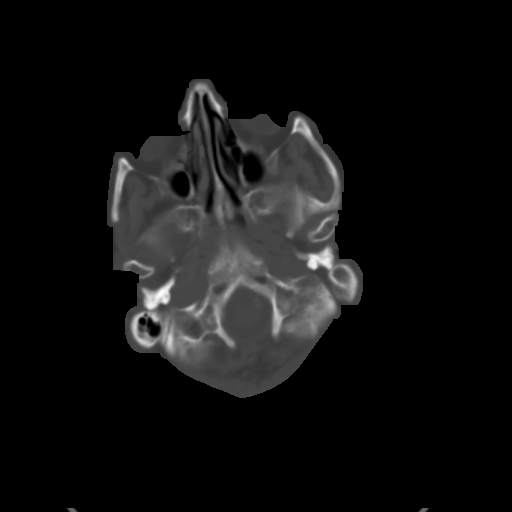
[im 9/34  brain]
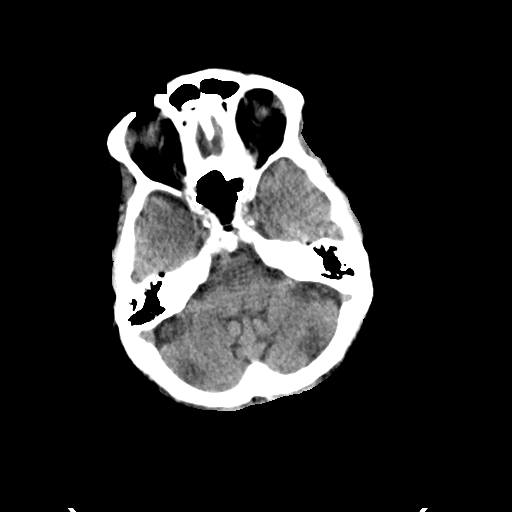
[im 13/34  brain]
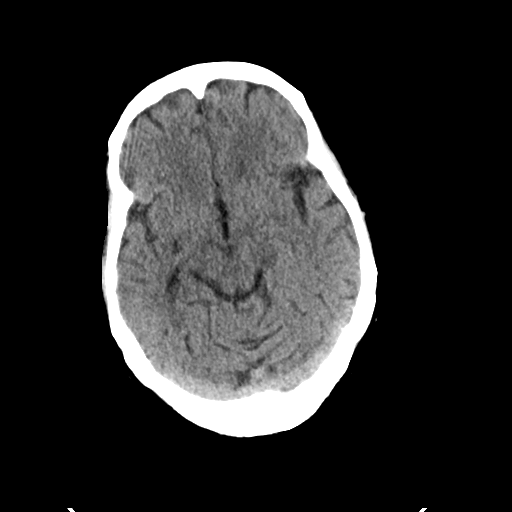
[im 17/34  brain]
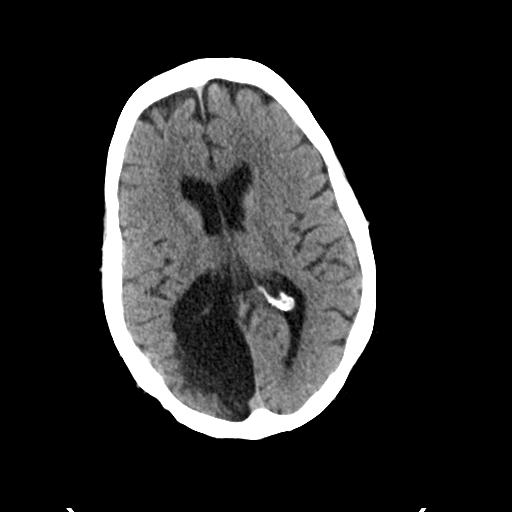
[im 21/34  brain]
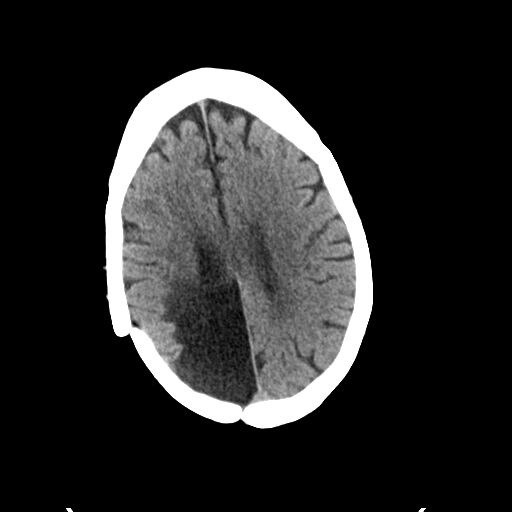
[im 21/34  bone]
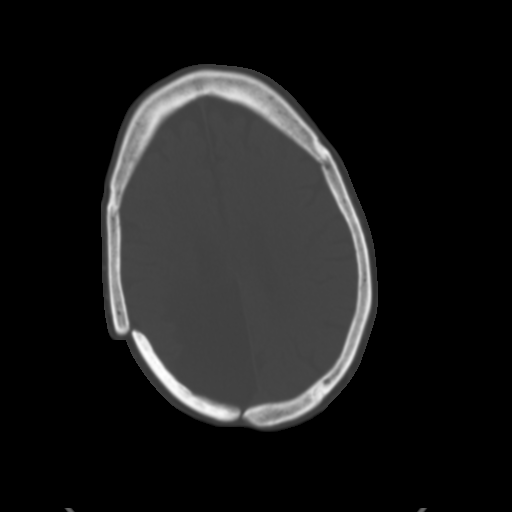
[im 25/34  brain]
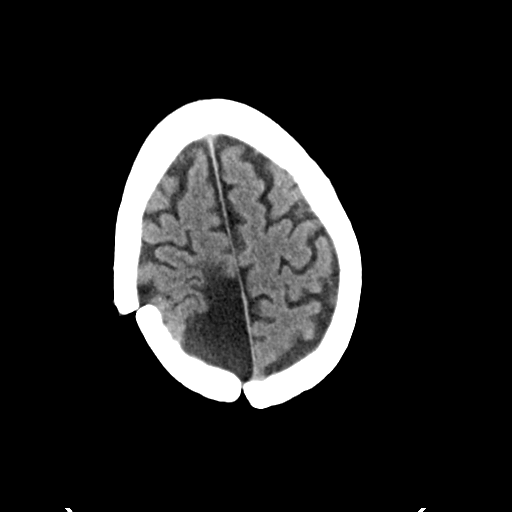
[im 29/34  brain]
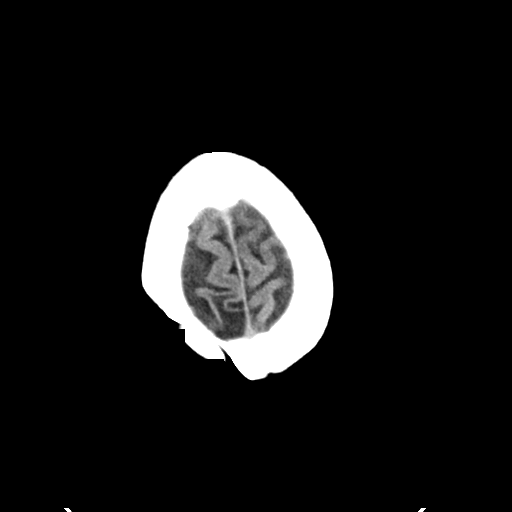

[Series 4: head bone · axial · 0.51mm/px · z∈[-112,-96]mm · 2 of 84 slices shown]
[im 9/84  bone]
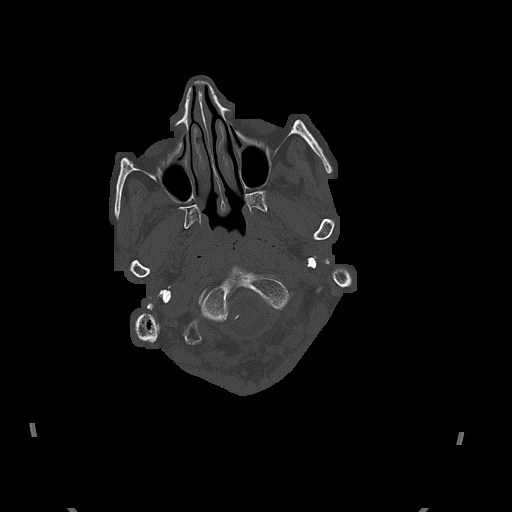
[im 17/84  bone]
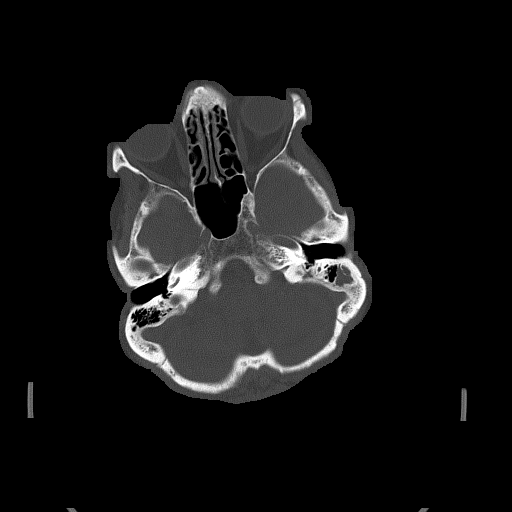

[Series 5: head without cor · coronal · non-contrast · 0.33mm/px · 3 of 72 slices shown]
[im 24/72  brain]
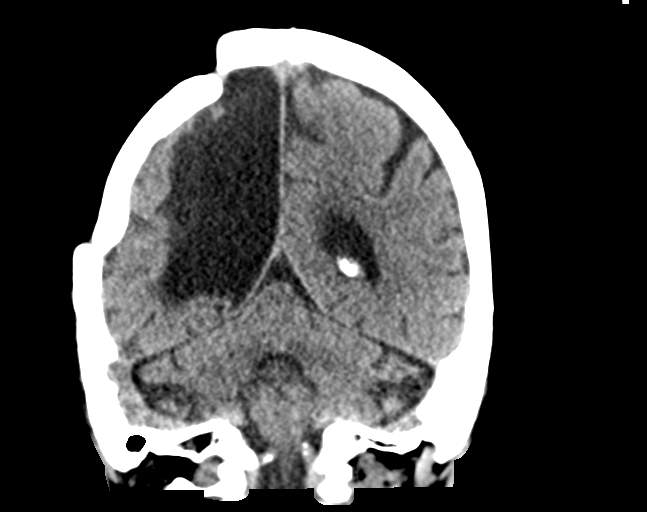
[im 32/72  brain]
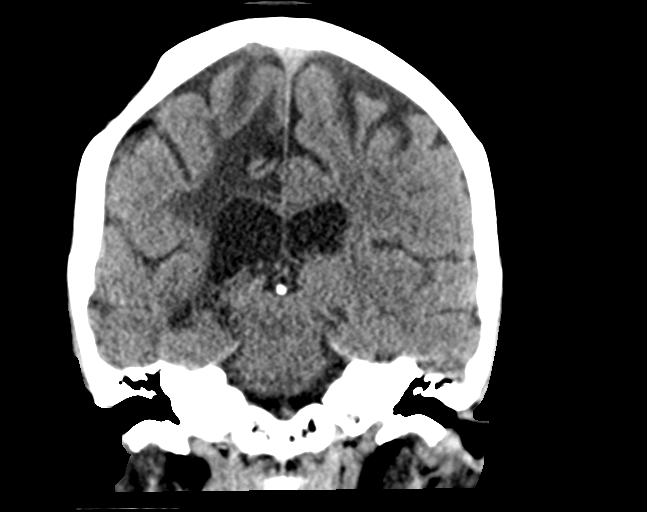
[im 40/72  brain]
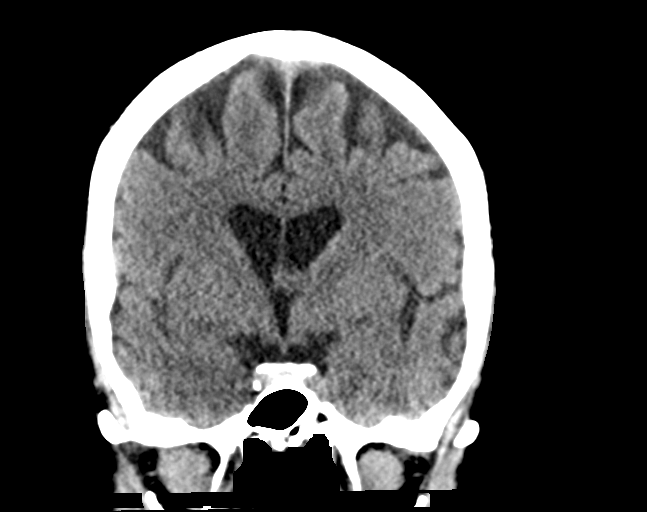

[Series 6: head without sag · sagittal · non-contrast · 0.33mm/px · 3 of 65 slices shown]
[im 22/65  brain]
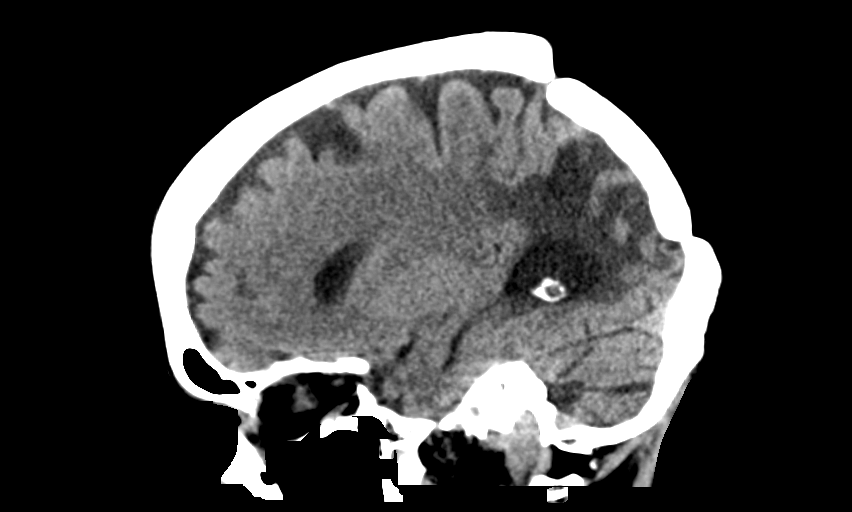
[im 33/65  brain]
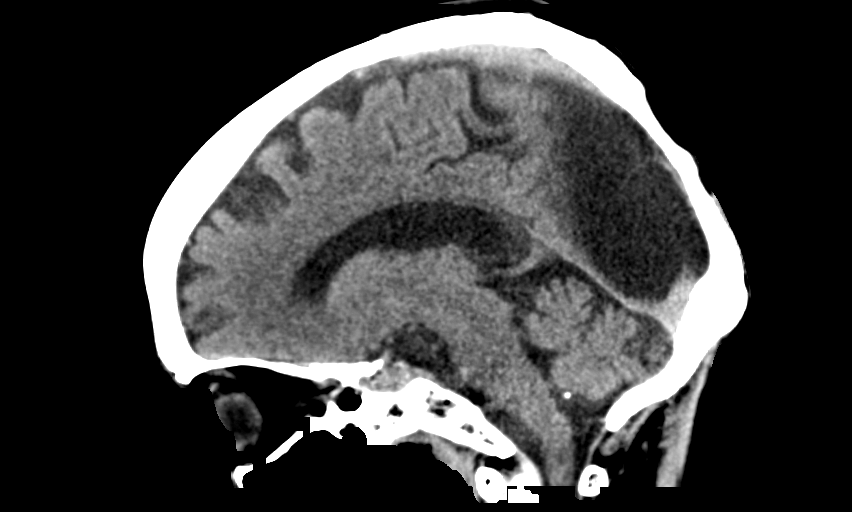
[im 43/65  brain]
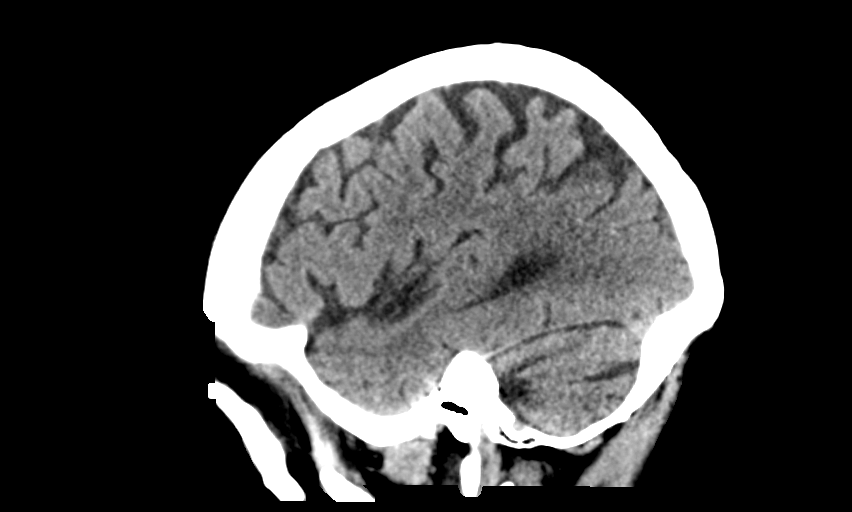

[15 of 47 positions shown; findings below may reference images not displayed]

FINDINGS: Brain: Large area of encephalomalacia in the posterior right
cerebral hemisphere, stable. No acute intracranial abnormality.
Specifically, no hemorrhage, hydrocephalus, mass lesion, acute
infarction, or significant intracranial injury.

Vascular: No hyperdense vessel or unexpected calcification.

Skull: No acute calvarial abnormality. Right parietal craniotomy,
stable.

Sinuses/Orbits: Visualized paranasal sinuses and mastoids clear.
Orbital soft tissues unremarkable.

Other: None
IMPRESSION: Large area of encephalomalacia in the posterior right cerebral
hemisphere.

No acute intracranial abnormality.

## 2020-07-05 ENCOUNTER — Telehealth: Payer: Self-pay | Admitting: Neurology

## 2020-07-05 NOTE — Telephone Encounter (Signed)
Dilantin 100 MG: patient's pharmacy called and is wanting the okay to switch the prescription to brand name only.

## 2020-07-05 NOTE — Telephone Encounter (Signed)
Pharmacy called and script for dilantin was fixed

## 2020-07-05 NOTE — Telephone Encounter (Signed)
Pharmacy is sending out the name brand medication to the pt,

## 2020-07-08 IMAGING — CT CT CERVICAL SPINE W/O CM
5 of 8 series · 11 of 33 positions shown, 12 images · non-contrast
Comparison: Cervical spine CT August 05, 2017;

CLINICAL DATA: Seizures with apparent fall

EXAM:
CT HEAD WITHOUT CONTRAST
CT CERVICAL SPINE WITHOUT CONTRAST
TECHNIQUE: Multidetector CT imaging of the head and cervical spine was
performed following the standard protocol without intravenous
contrast. Multiplanar CT image reconstructions of the cervical spine
were also generated.

[Series 6: head bone · axial · 0.47mm/px · z∈[-104,-46]mm · 2 of 88 slices shown]
[im 30/88  bone]
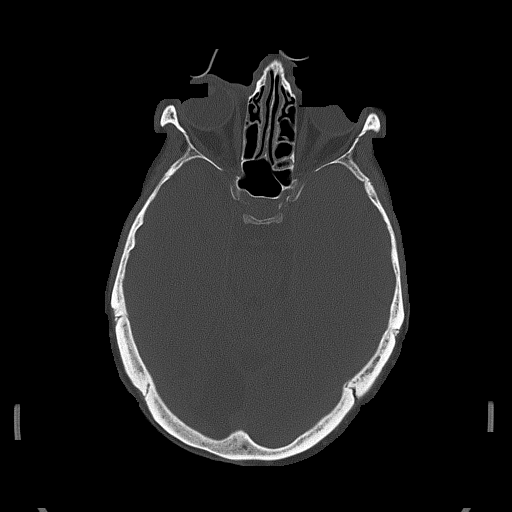
[im 59/88  bone]
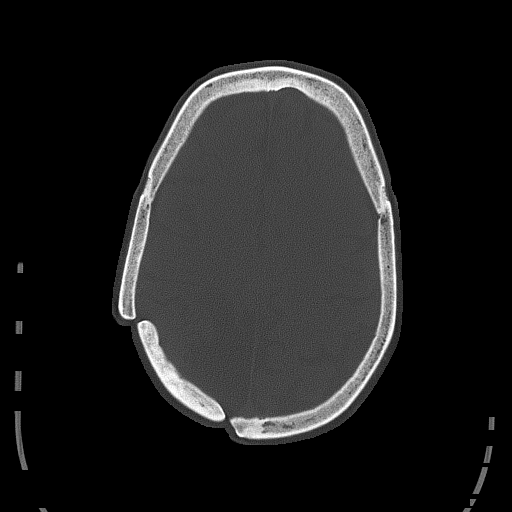

[Series 10: c_spine 2.0 st · axial · 0.36mm/px · z∈[-227,-161]mm · 2 of 100 slices shown, 3 images]
[im 34/100  soft-tissue]
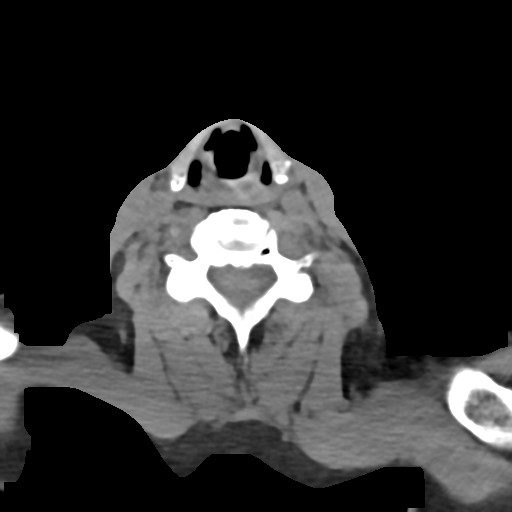
[im 34/100  bone]
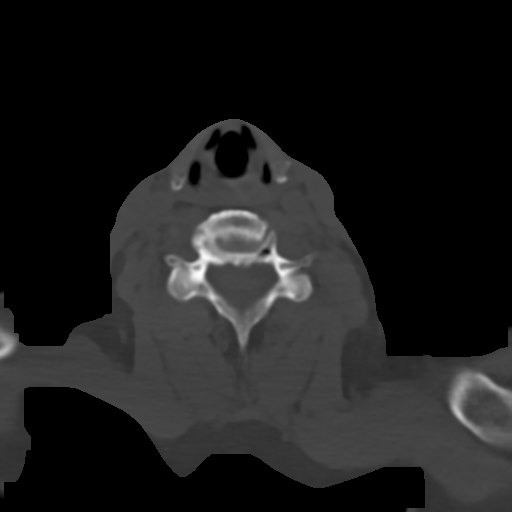
[im 67/100  bone]
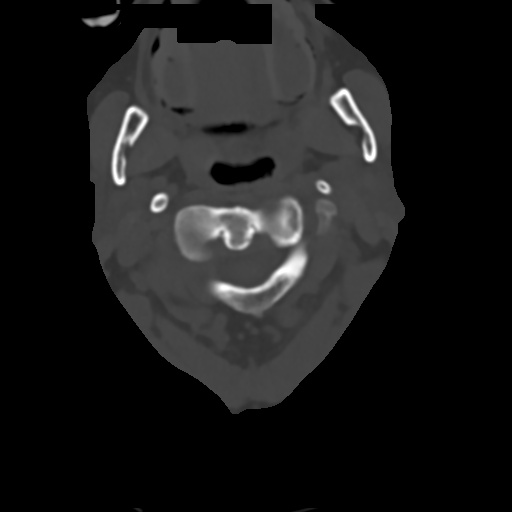

[Series 12: c_spine 2.0 sag bone · sagittal · 0.29mm/px · 4 of 61 slices shown]
[im 13/61  bone]
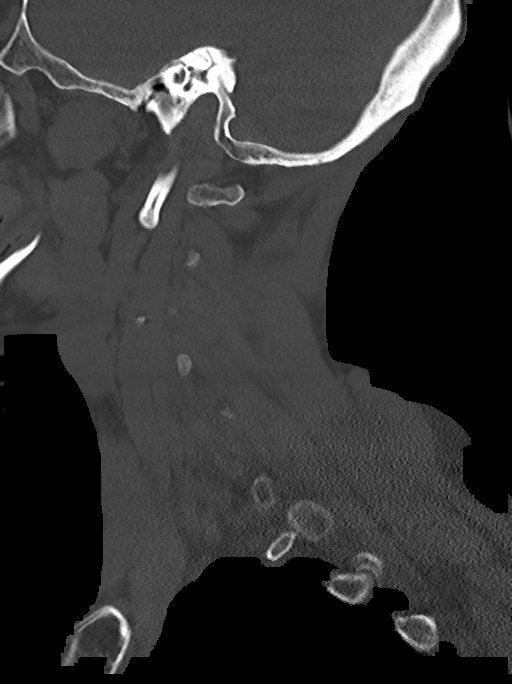
[im 25/61  bone]
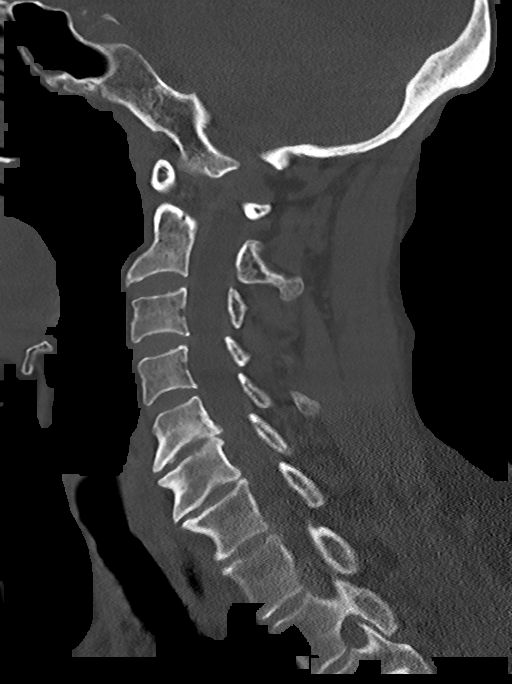
[im 37/61  bone]
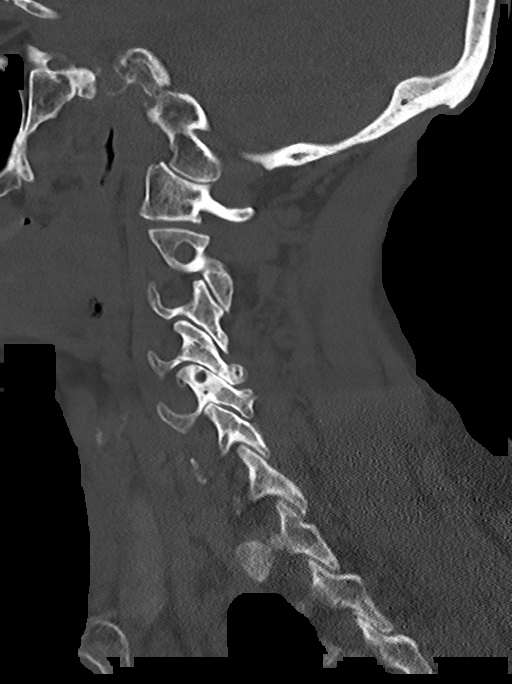
[im 49/61  bone]
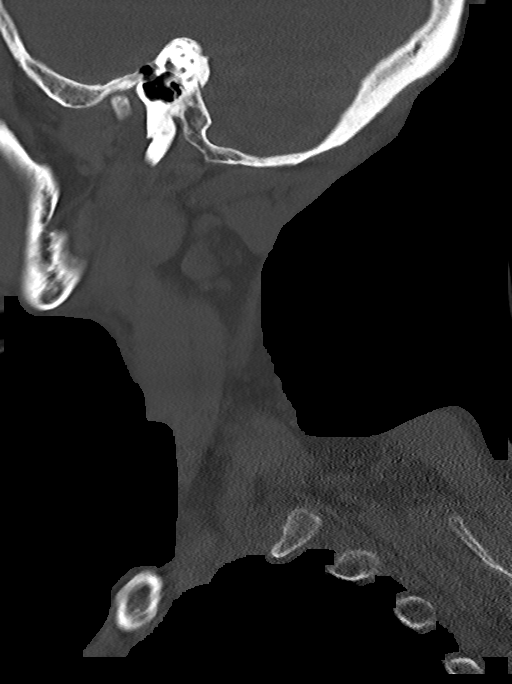

[Series 13: c_spine 2.0 cor bone · coronal · 0.29mm/px · 1 of 61 slices shown]
[im 31/61  bone]
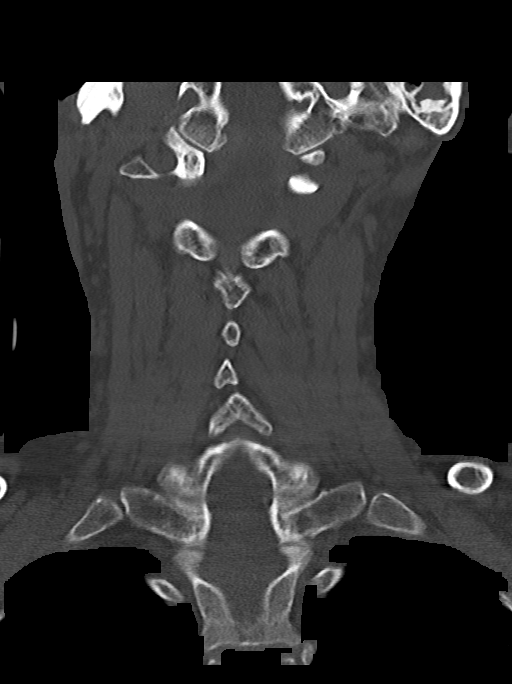

[Series 14: c_spine 2.0 orthogonals · axial · 0.21mm/px · z∈[-258,-188]mm · 2 of 91 slices shown]
[im 31/91  bone]
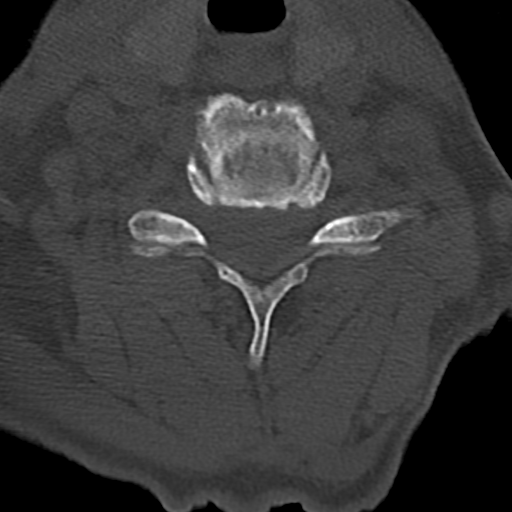
[im 61/91  bone]
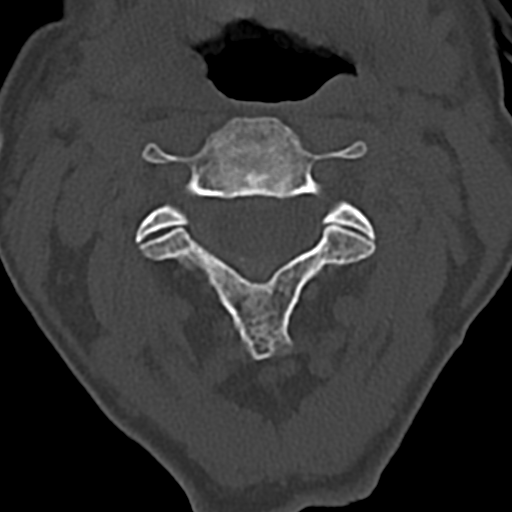

[11 of 33 positions shown; findings below may reference images not displayed]

brain MRI March 20, 2018; cervical spine MRI March 20, 2018; head CT May 24, 2018
FINDINGS: CT HEAD FINDINGS

Brain: The ventricles are normal in size and configuration. There is
extensive encephalomalacia in the right parieto-occipital region,
stable. This encephalomalacia connects to the atrium of the lateral
ventricle on the right with enlargement of this portion of the right
lateral ventricle, stable. There is no new encephalomalacia. There
is no evident mass, hemorrhage, extra-axial fluid collection, or
midline shift. Brain parenchyma elsewhere appears unremarkable. No
acute infarct is demonstrable.

Vascular: No hyperdense vessel. There are foci of calcification in
each distal vertebral artery and carotid siphon region.

Skull: Patient is status post right-sided craniotomy defects,
stable. No new bony defects are evident.

Sinuses/Orbits: There is mucosal thickening and opacification in
several ethmoid air cells. There is opacification of much of the
right maxillary antrum with an air-fluid level in this area. There
is mucosal thickening in the inferior right frontal sinus. Orbits
appear symmetric bilaterally.

Other: Mastoid air cells on the right are clear. There is mastoid
disease on the left, stable.

CT CERVICAL SPINE FINDINGS

Alignment: There is no appreciable spondylolisthesis.

Skull base and vertebrae: Skull base and craniocervical junction
regions appear normal. There is no appreciable fracture. There are
no blastic or lytic bone lesions.

Soft tissues and spinal canal: Prevertebral soft tissues and
predental space regions are normal. There is no paraspinous lesion.
There is no evident cord or canal hematoma.

Disc levels: There is moderate disc space narrowing at C5-6, C6-7,
and C7-T1. Anterior osteophytes are noted at C5, C6, and C7. There
is facet hypertrophy at several levels bilaterally. There is exit
foraminal narrowing due to bony hypertrophy at C5-6 on the right
with impression on the exiting nerve root at this level due to bony
hypertrophy. There is no frank disc extrusion or high-grade
stenosis.

Upper chest: Visualized upper lung regions are clear.

Other: There is calcification in each carotid artery..
IMPRESSION: CT head: Extensive encephalomalacia in the right parieto-occipital
region with involvement of the atrium of the right lateral
ventricle, stable. No new encephalomalacia. No acute infarct. There
is no mass or hemorrhage. Postoperative changes noted in the right
calvarium, stable.

Foci of arterial vascular calcification noted. Areas of paranasal
sinus disease at multiple sites, most severe in the right maxillary
antrum. There is stable left-sided mastoid disease.

CT cervical spine: No evident fracture or spondylolisthesis.
Osteoarthritic change at multiple levels with impression on the
exiting nerve root on the right at C5-6 due to bony hypertrophy. No
frank disc extrusion or stenosis. There are foci of carotid artery
calcification bilaterally. Fluid in the upper esophagus may be
indicative of gastroesophageal reflux.

## 2020-07-08 IMAGING — DX DG CHEST 1V PORT
1 series · 1 of 1 positions shown · non-contrast
Comparison: None.

CLINICAL DATA: Fever

EXAM:
PORTABLE CHEST 1 VIEW

[chest ap]
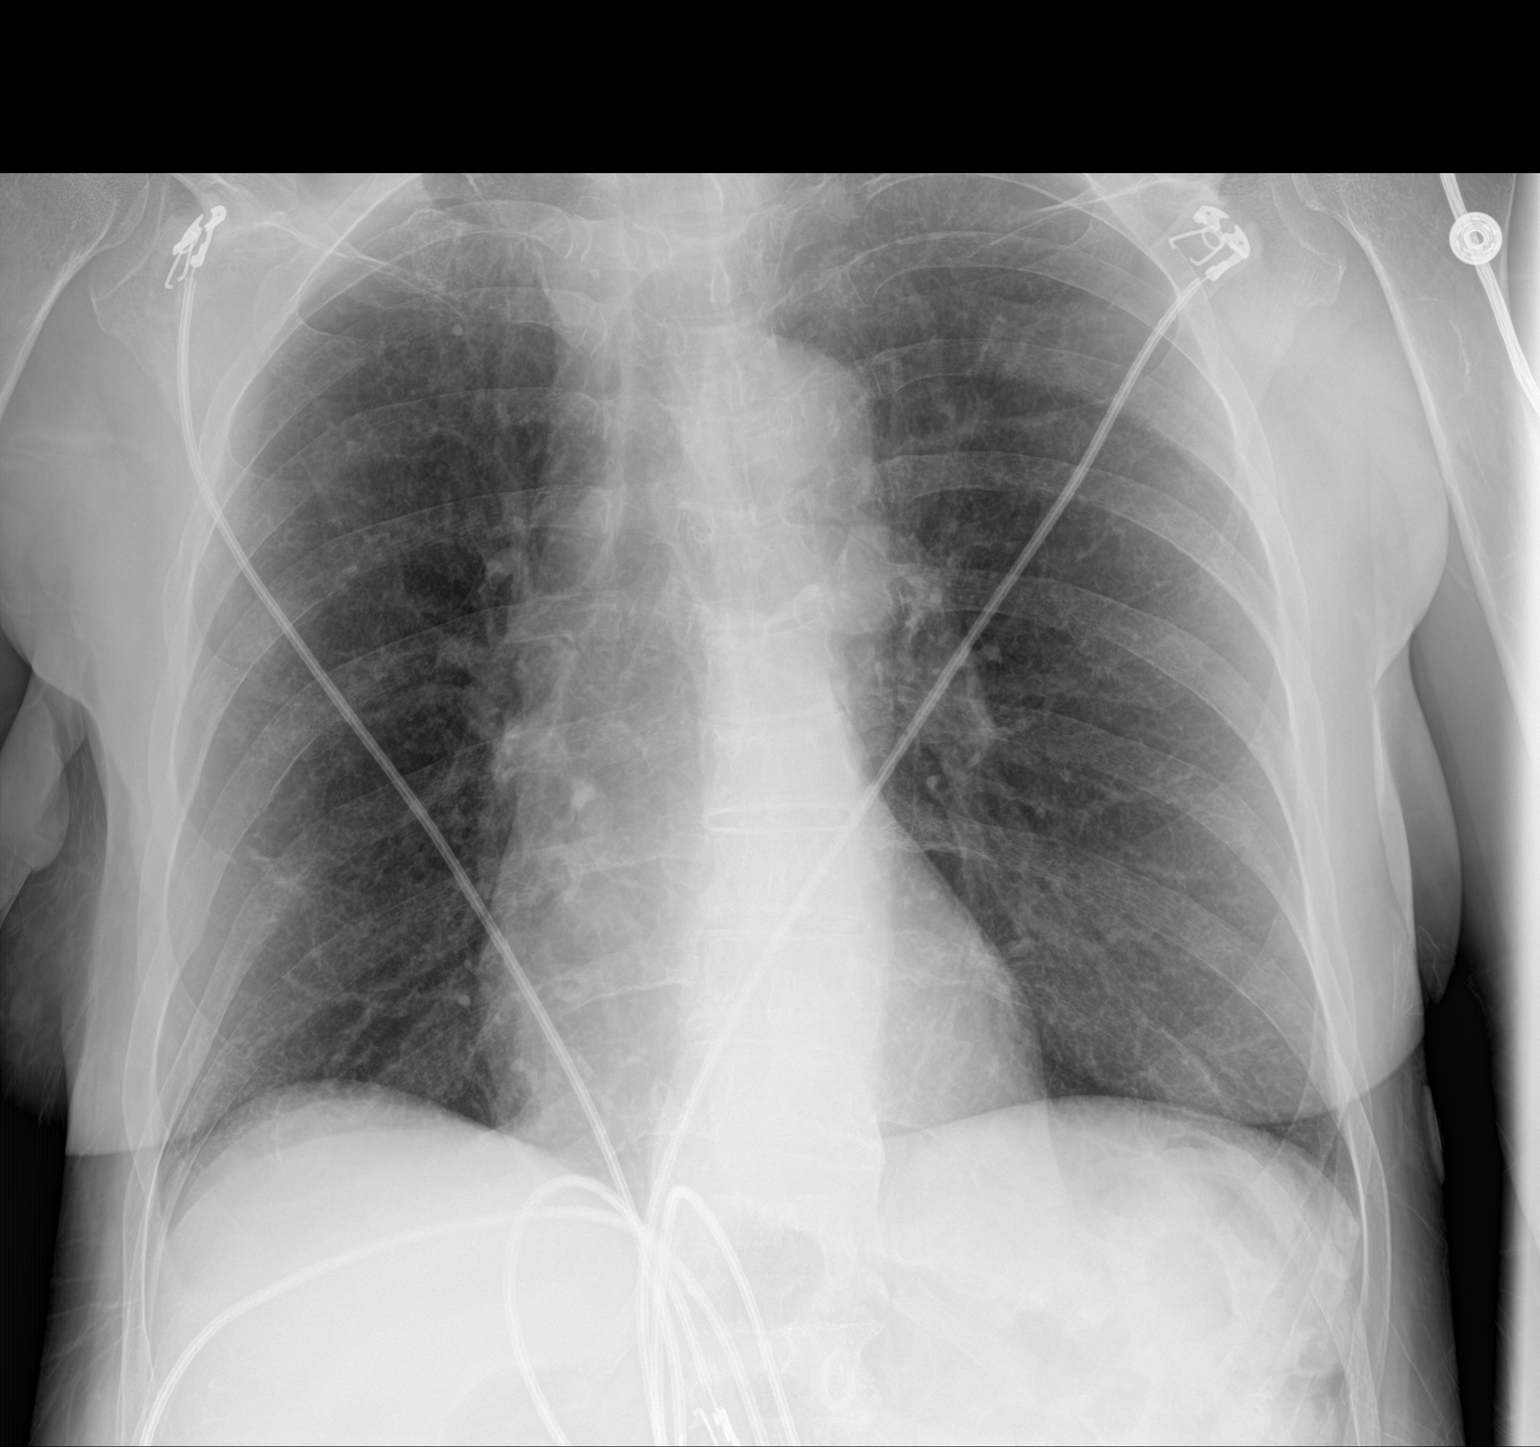

[1 of 1 positions shown; findings below may reference images not displayed]

FINDINGS: The heart size and mediastinal contours are within normal limits.
Both lungs are clear. The visualized skeletal structures are
unremarkable.
IMPRESSION: No active disease.

## 2020-07-10 IMAGING — MR MR HEAD W/O CM
9 of 10 series · 38 of 48 positions shown · non-contrast
Comparison: CT HEAD May 29, 2018 and MRI of the head March 20, 2018

CLINICAL DATA: Confusion, seizure. History of intracranial
hemorrhage, seizures, brain surgery.

EXAM:
MRI HEAD WITHOUT CONTRAST
TECHNIQUE: Multiplanar, multiecho pulse sequences of the brain and surrounding
structures were obtained without intravenous contrast.

[Series 3: DWI · axial · 3.0mm · 1.09mm/px · z∈[-52,+79]mm · 11 of 90 slices shown (1 of 4)]
[im 1/90]
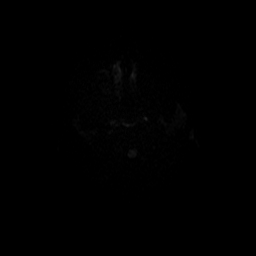
[im 9/90]
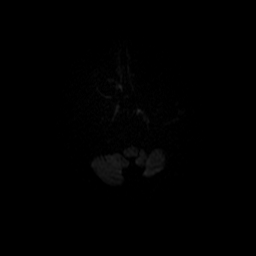
[im 18/90]
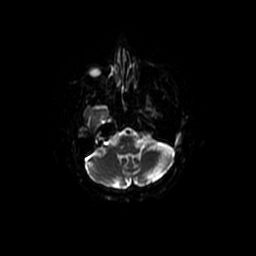
[im 27/90]
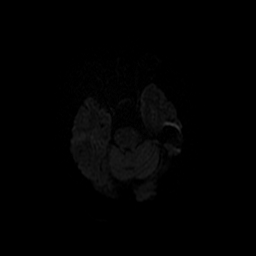
[im 36/90]
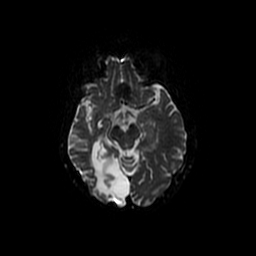
[im 45/90]
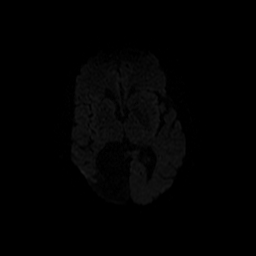
[im 54/90]
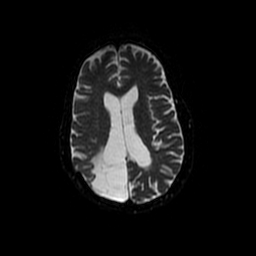
[im 63/90]
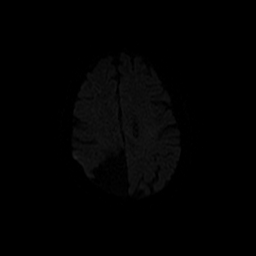
[im 72/90]
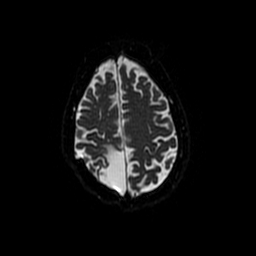
[im 81/90]
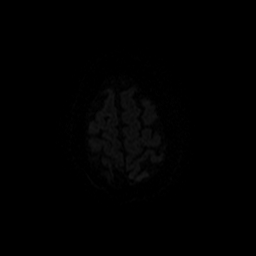
[im 90/90]
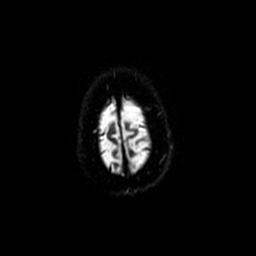

[Series 4: DWI · coronal · 5.0mm · 1.09mm/px · 7 of 70 slices shown (2 of 4)]
[im 1/70]
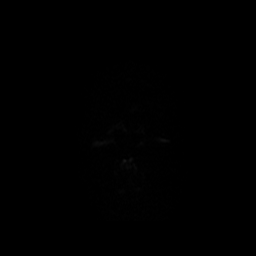
[im 12/70]
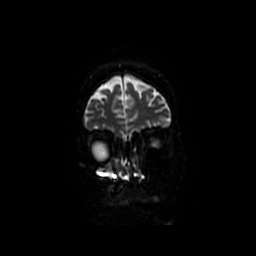
[im 24/70]
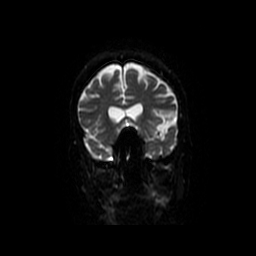
[im 35/70]
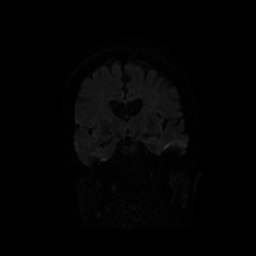
[im 47/70]
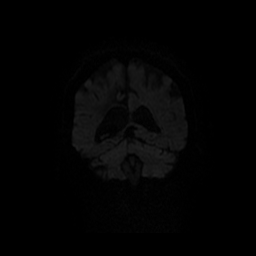
[im 58/70]
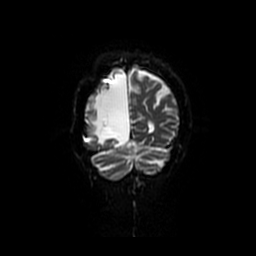
[im 70/70]
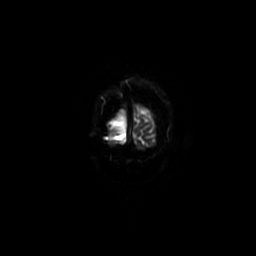

[Series 5: T1 · sagittal · 5.0mm · 0.47mm/px · 2 of 23 slices shown]
[im 1/23]
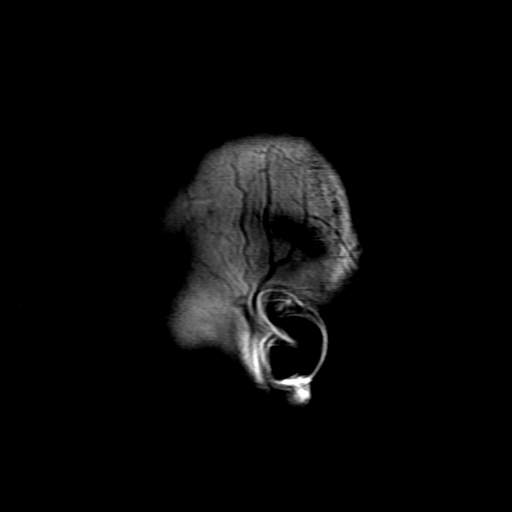
[im 23/23]
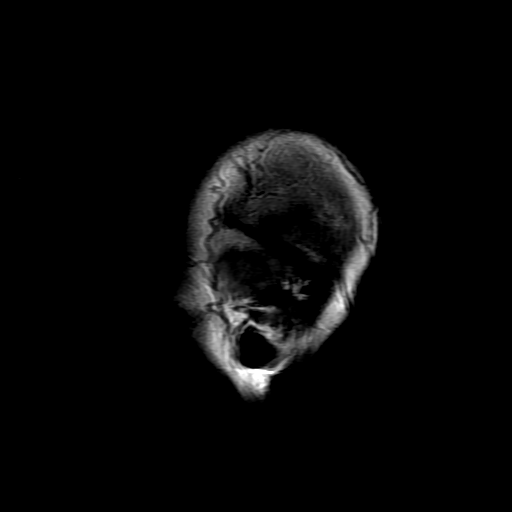

[Series 7: T2 · axial · 5.0mm · 0.43mm/px · z∈[-49,+83]mm · 2 of 23 slices shown (1 of 2)]
[im 1/23]
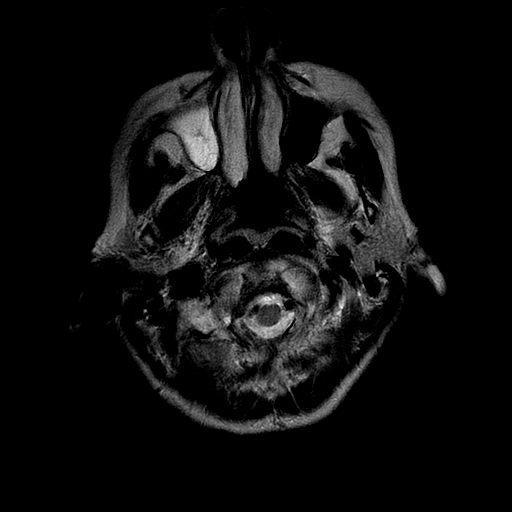
[im 23/23]
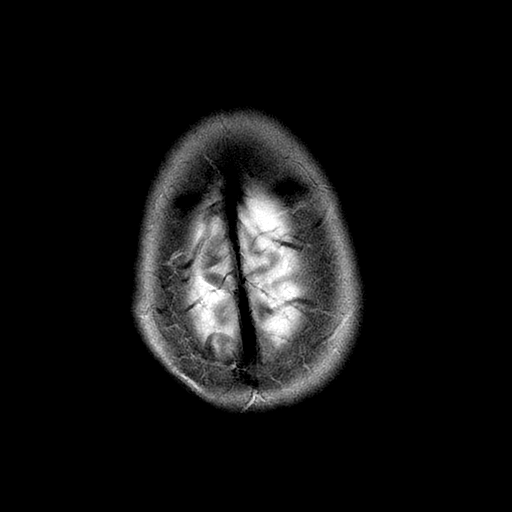

[Series 8: FLAIR · axial · 3.0mm · 0.43mm/px · z∈[-49,+83]mm · 2 of 23 slices shown]
[im 1/23]
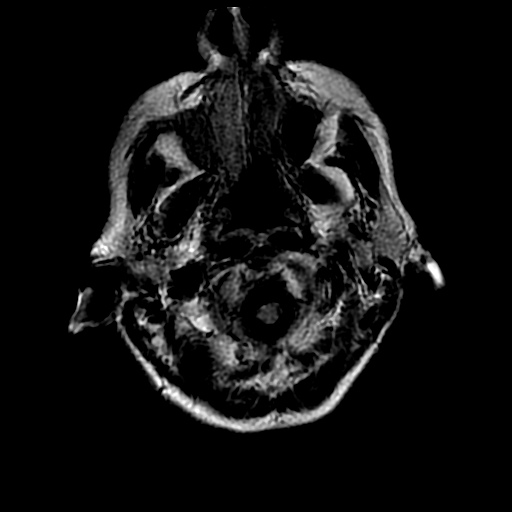
[im 23/23]
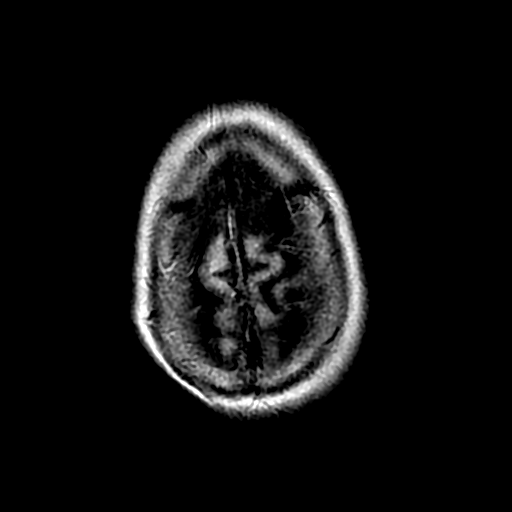

[Series 10: ax mpgr · axial · 3.0mm · 0.43mm/px · z∈[-49,+83]mm · 2 of 23 slices shown]
[im 1/23]
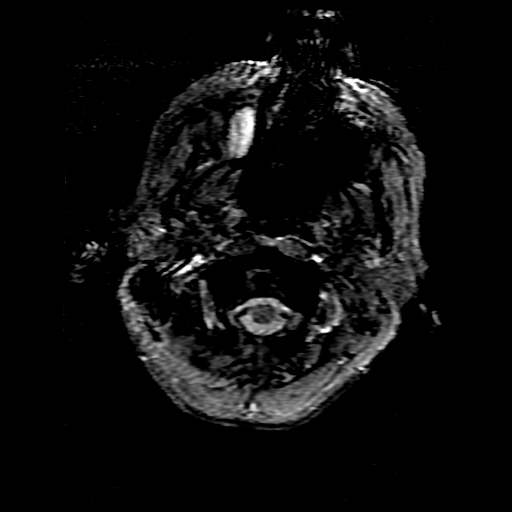
[im 23/23]
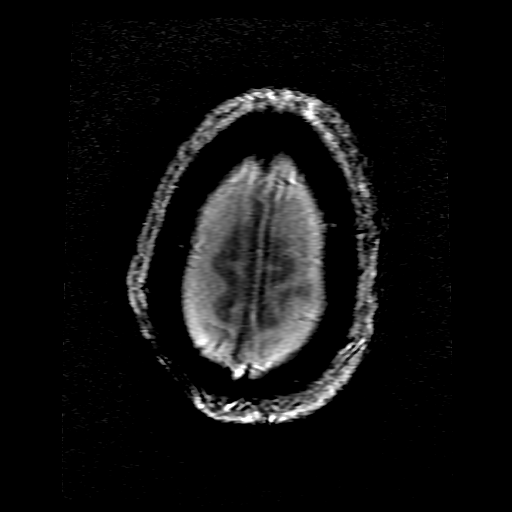

[Series 13: T2 · coronal · 5.0mm · 0.43mm/px · 3 of 29 slices shown (2 of 2)]
[im 1/29]
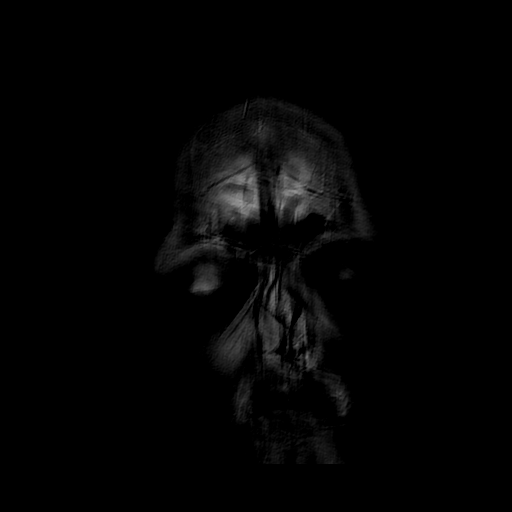
[im 15/29]
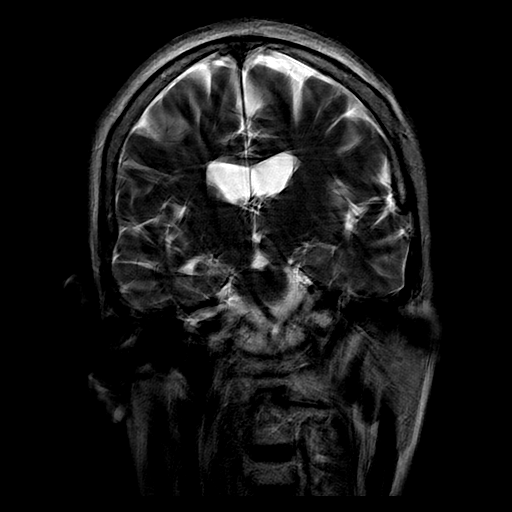
[im 29/29]
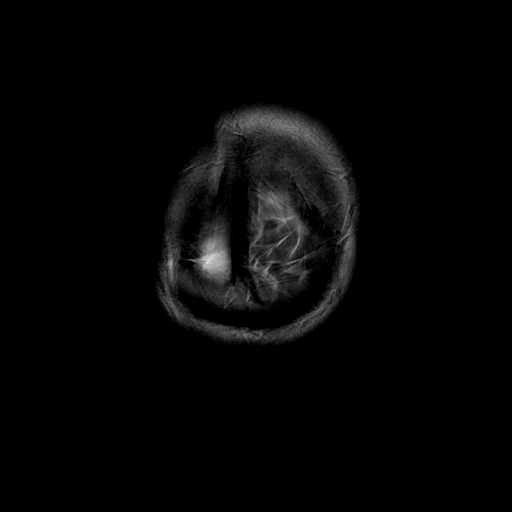

[Series 300: DWI · axial · 3.0mm · 1.09mm/px · z∈[-52,+79]mm · 5 of 45 slices shown (3 of 4)]
[im 1/45]
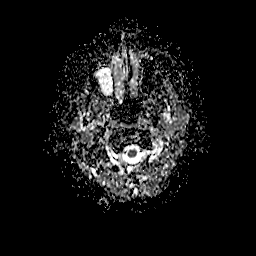
[im 12/45]
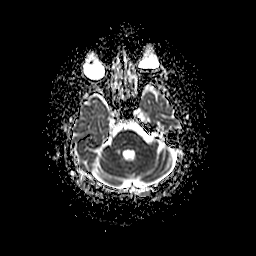
[im 23/45]
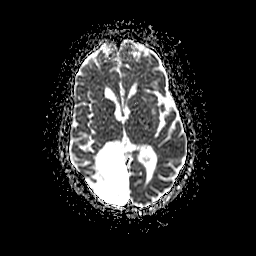
[im 34/45]
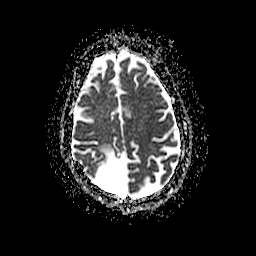
[im 45/45]
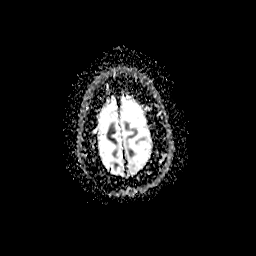

[Series 400: DWI · coronal · 5.0mm · 1.09mm/px · 4 of 35 slices shown (4 of 4)]
[im 1/35]
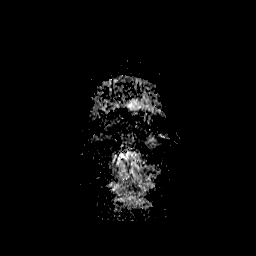
[im 12/35]
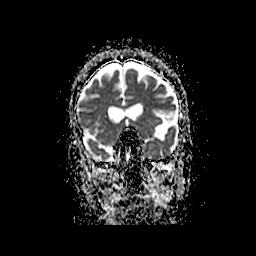
[im 23/35]
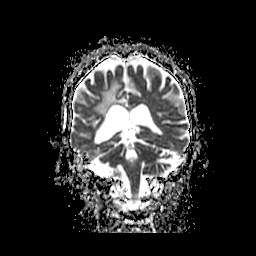
[im 35/35]
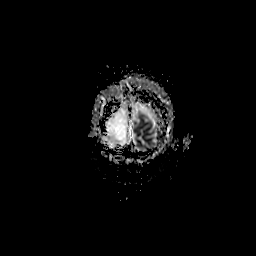

[38 of 48 positions shown; findings below may reference images not displayed]

FINDINGS: Mild motion degraded examination.

INTRACRANIAL CONTENTS: No reduced diffusion to suggest acute
ischemia. No susceptibility artifact to suggest hemorrhage. RIGHT
parietoccipital encephalomalacia with porencephaly, ex vacuo
dilatation RIGHT lateral ventricle. No hydrocephalus. No suspicious
parenchymal signal. No mass or mass effect. No abnormal extra-axial
fluid collections.

VASCULAR: Normal major intracranial vascular flow voids present at
skull base.

SKULL AND UPPER CERVICAL SPINE: No abnormal sellar expansion. No
suspicious calvarial bone marrow signal. Old RIGHT craniotomy.
Craniocervical junction maintained.

SINUSES/ORBITS: Severe RIGHT maxillary sinus mucosal thickening.
Mild ethmoid mucosal thickening. Small bilateral mastoid effusions.
Included ocular globes and orbital contents are non-suspicious.

OTHER: Patient is edentulous.
IMPRESSION: 1. Mildly motion degraded examination. No acute intracranial
process.
2. Unchanged RIGHT parietoccipital encephalomalacia.

## 2020-08-02 ENCOUNTER — Inpatient Hospital Stay (HOSPITAL_COMMUNITY): Payer: Medicare Other

## 2020-08-02 ENCOUNTER — Inpatient Hospital Stay (HOSPITAL_COMMUNITY)
Admission: EM | Admit: 2020-08-02 | Discharge: 2020-08-12 | DRG: 563 | Disposition: A | Discharge status: Home Health | Payer: Medicare Other | Attending: Internal Medicine | Admitting: Internal Medicine

## 2020-08-02 ENCOUNTER — Emergency Department (HOSPITAL_COMMUNITY): Payer: Medicare Other

## 2020-08-02 DIAGNOSIS — R609 Edema, unspecified: Secondary | ICD-10-CM | POA: Diagnosis not present

## 2020-08-02 DIAGNOSIS — W109XXA Fall (on) (from) unspecified stairs and steps, initial encounter: Secondary | ICD-10-CM | POA: Diagnosis present

## 2020-08-02 DIAGNOSIS — J449 Chronic obstructive pulmonary disease, unspecified: Secondary | ICD-10-CM | POA: Diagnosis not present

## 2020-08-02 DIAGNOSIS — G629 Polyneuropathy, unspecified: Secondary | ICD-10-CM | POA: Diagnosis present

## 2020-08-02 DIAGNOSIS — R739 Hyperglycemia, unspecified: Secondary | ICD-10-CM | POA: Diagnosis not present

## 2020-08-02 DIAGNOSIS — I471 Supraventricular tachycardia: Secondary | ICD-10-CM | POA: Diagnosis not present

## 2020-08-02 DIAGNOSIS — S63273A Dislocation of unspecified interphalangeal joint of left middle finger, initial encounter: Secondary | ICD-10-CM | POA: Diagnosis present

## 2020-08-02 DIAGNOSIS — M7989 Other specified soft tissue disorders: Secondary | ICD-10-CM | POA: Diagnosis not present

## 2020-08-02 DIAGNOSIS — W19XXXA Unspecified fall, initial encounter: Secondary | ICD-10-CM

## 2020-08-02 DIAGNOSIS — Z043 Encounter for examination and observation following other accident: Secondary | ICD-10-CM | POA: Diagnosis not present

## 2020-08-02 DIAGNOSIS — B9620 Unspecified Escherichia coli [E. coli] as the cause of diseases classified elsewhere: Secondary | ICD-10-CM | POA: Diagnosis present

## 2020-08-02 DIAGNOSIS — F419 Anxiety disorder, unspecified: Secondary | ICD-10-CM | POA: Diagnosis present

## 2020-08-02 DIAGNOSIS — S63283A Dislocation of proximal interphalangeal joint of left middle finger, initial encounter: Secondary | ICD-10-CM | POA: Diagnosis not present

## 2020-08-02 DIAGNOSIS — K219 Gastro-esophageal reflux disease without esophagitis: Secondary | ICD-10-CM | POA: Diagnosis present

## 2020-08-02 DIAGNOSIS — Z888 Allergy status to other drugs, medicaments and biological substances status: Secondary | ICD-10-CM

## 2020-08-02 DIAGNOSIS — R42 Dizziness and giddiness: Secondary | ICD-10-CM | POA: Diagnosis not present

## 2020-08-02 DIAGNOSIS — Z8711 Personal history of peptic ulcer disease: Secondary | ICD-10-CM

## 2020-08-02 DIAGNOSIS — F32A Depression, unspecified: Secondary | ICD-10-CM | POA: Diagnosis present

## 2020-08-02 DIAGNOSIS — Z743 Need for continuous supervision: Secondary | ICD-10-CM | POA: Diagnosis not present

## 2020-08-02 DIAGNOSIS — Z79899 Other long term (current) drug therapy: Secondary | ICD-10-CM

## 2020-08-02 DIAGNOSIS — G40909 Epilepsy, unspecified, not intractable, without status epilepticus: Secondary | ICD-10-CM | POA: Diagnosis present

## 2020-08-02 DIAGNOSIS — S62641A Nondisplaced fracture of proximal phalanx of left index finger, initial encounter for closed fracture: Principal | ICD-10-CM | POA: Diagnosis present

## 2020-08-02 DIAGNOSIS — S3991XA Unspecified injury of abdomen, initial encounter: Secondary | ICD-10-CM | POA: Diagnosis not present

## 2020-08-02 DIAGNOSIS — I4891 Unspecified atrial fibrillation: Secondary | ICD-10-CM

## 2020-08-02 DIAGNOSIS — F17210 Nicotine dependence, cigarettes, uncomplicated: Secondary | ICD-10-CM | POA: Diagnosis present

## 2020-08-02 DIAGNOSIS — S63259A Unspecified dislocation of unspecified finger, initial encounter: Secondary | ICD-10-CM | POA: Diagnosis not present

## 2020-08-02 DIAGNOSIS — I484 Atypical atrial flutter: Secondary | ICD-10-CM | POA: Diagnosis not present

## 2020-08-02 DIAGNOSIS — M542 Cervicalgia: Secondary | ICD-10-CM | POA: Diagnosis not present

## 2020-08-02 DIAGNOSIS — I480 Paroxysmal atrial fibrillation: Secondary | ICD-10-CM | POA: Diagnosis present

## 2020-08-02 DIAGNOSIS — N12 Tubulo-interstitial nephritis, not specified as acute or chronic: Secondary | ICD-10-CM | POA: Diagnosis present

## 2020-08-02 DIAGNOSIS — R410 Disorientation, unspecified: Secondary | ICD-10-CM | POA: Diagnosis not present

## 2020-08-02 DIAGNOSIS — G8929 Other chronic pain: Secondary | ICD-10-CM | POA: Diagnosis present

## 2020-08-02 DIAGNOSIS — S299XXA Unspecified injury of thorax, initial encounter: Secondary | ICD-10-CM | POA: Diagnosis not present

## 2020-08-02 DIAGNOSIS — I248 Other forms of acute ischemic heart disease: Secondary | ICD-10-CM | POA: Diagnosis not present

## 2020-08-02 DIAGNOSIS — E876 Hypokalemia: Secondary | ICD-10-CM | POA: Diagnosis present

## 2020-08-02 DIAGNOSIS — S12100A Unspecified displaced fracture of second cervical vertebra, initial encounter for closed fracture: Secondary | ICD-10-CM | POA: Diagnosis present

## 2020-08-02 DIAGNOSIS — Z87820 Personal history of traumatic brain injury: Secondary | ICD-10-CM

## 2020-08-02 DIAGNOSIS — R52 Pain, unspecified: Secondary | ICD-10-CM | POA: Diagnosis not present

## 2020-08-02 DIAGNOSIS — Z8249 Family history of ischemic heart disease and other diseases of the circulatory system: Secondary | ICD-10-CM

## 2020-08-02 DIAGNOSIS — Z8616 Personal history of COVID-19: Secondary | ICD-10-CM

## 2020-08-02 DIAGNOSIS — R519 Headache, unspecified: Secondary | ICD-10-CM | POA: Diagnosis not present

## 2020-08-02 DIAGNOSIS — S62611A Displaced fracture of proximal phalanx of left index finger, initial encounter for closed fracture: Secondary | ICD-10-CM | POA: Diagnosis not present

## 2020-08-02 DIAGNOSIS — R079 Chest pain, unspecified: Secondary | ICD-10-CM | POA: Diagnosis not present

## 2020-08-02 DIAGNOSIS — Z86718 Personal history of other venous thrombosis and embolism: Secondary | ICD-10-CM

## 2020-08-02 DIAGNOSIS — I483 Typical atrial flutter: Secondary | ICD-10-CM | POA: Diagnosis not present

## 2020-08-02 DIAGNOSIS — I959 Hypotension, unspecified: Secondary | ICD-10-CM | POA: Diagnosis not present

## 2020-08-02 DIAGNOSIS — R55 Syncope and collapse: Secondary | ICD-10-CM | POA: Diagnosis present

## 2020-08-02 DIAGNOSIS — Z885 Allergy status to narcotic agent status: Secondary | ICD-10-CM

## 2020-08-02 DIAGNOSIS — Z9851 Tubal ligation status: Secondary | ICD-10-CM

## 2020-08-02 DIAGNOSIS — Y92009 Unspecified place in unspecified non-institutional (private) residence as the place of occurrence of the external cause: Secondary | ICD-10-CM | POA: Diagnosis present

## 2020-08-02 DIAGNOSIS — Z20822 Contact with and (suspected) exposure to covid-19: Secondary | ICD-10-CM | POA: Diagnosis present

## 2020-08-02 DIAGNOSIS — S0990XA Unspecified injury of head, initial encounter: Secondary | ICD-10-CM | POA: Diagnosis not present

## 2020-08-02 DIAGNOSIS — R569 Unspecified convulsions: Secondary | ICD-10-CM | POA: Diagnosis not present

## 2020-08-02 DIAGNOSIS — I4892 Unspecified atrial flutter: Secondary | ICD-10-CM | POA: Diagnosis not present

## 2020-08-02 DIAGNOSIS — Z91018 Allergy to other foods: Secondary | ICD-10-CM

## 2020-08-02 DIAGNOSIS — Z87442 Personal history of urinary calculi: Secondary | ICD-10-CM

## 2020-08-02 DIAGNOSIS — I82623 Acute embolism and thrombosis of deep veins of upper extremity, bilateral: Secondary | ICD-10-CM | POA: Diagnosis not present

## 2020-08-02 DIAGNOSIS — Z800 Family history of malignant neoplasm of digestive organs: Secondary | ICD-10-CM

## 2020-08-02 DIAGNOSIS — I499 Cardiac arrhythmia, unspecified: Secondary | ICD-10-CM | POA: Diagnosis not present

## 2020-08-02 DIAGNOSIS — R0902 Hypoxemia: Secondary | ICD-10-CM | POA: Diagnosis not present

## 2020-08-02 LAB — COMPREHENSIVE METABOLIC PANEL
ALT: 21 U/L (ref 0–44)
AST: 25 U/L (ref 15–41)
Albumin: 2.8 g/dL — ABNORMAL LOW (ref 3.5–5.0)
Alkaline Phosphatase: 54 U/L (ref 38–126)
Anion gap: 12 (ref 5–15)
BUN: 14 mg/dL (ref 8–23)
CO2: 26 mmol/L (ref 22–32)
Calcium: 8.7 mg/dL — ABNORMAL LOW (ref 8.9–10.3)
Chloride: 100 mmol/L (ref 98–111)
Creatinine, Ser: 1.02 mg/dL — ABNORMAL HIGH (ref 0.44–1.00)
GFR, Estimated: >60 mL/min (ref >60)
Glucose, Bld: 146 mg/dL — ABNORMAL HIGH (ref 70–99)
Potassium: 3.2 mmol/L — ABNORMAL LOW (ref 3.5–5.1)
Sodium: 138 mmol/L (ref 135–145)
Total Bilirubin: 0.3 mg/dL (ref 0.3–1.2)
Total Protein: 6.6 g/dL (ref 6.5–8.1)

## 2020-08-02 LAB — URINALYSIS, ROUTINE W REFLEX MICROSCOPIC
Bilirubin Urine: NEGATIVE
Glucose, UA: NEGATIVE mg/dL
Ketones, ur: NEGATIVE mg/dL
Nitrite: POSITIVE — AB
Protein, ur: 100 mg/dL — AB
Specific Gravity, Urine: 1.044 — ABNORMAL HIGH (ref 1.005–1.030)
WBC, UA: >50 WBC/hpf — ABNORMAL HIGH (ref 0–5)
pH: 5 (ref 5.0–8.0)

## 2020-08-02 LAB — CBC WITH DIFFERENTIAL/PLATELET
Abs Immature Granulocytes: 0.03 10*3/uL (ref 0.00–0.07)
Basophils Absolute: 0 10*3/uL (ref 0.0–0.1)
Basophils Relative: 0 %
Eosinophils Absolute: 0 10*3/uL (ref 0.0–0.5)
Eosinophils Relative: 0 %
HCT: 38.9 % (ref 36.0–46.0)
Hemoglobin: 13.3 g/dL (ref 12.0–15.0)
Immature Granulocytes: 0 %
Lymphocytes Relative: 11 %
Lymphs Abs: 0.9 10*3/uL (ref 0.7–4.0)
MCH: 34.7 pg — ABNORMAL HIGH (ref 26.0–34.0)
MCHC: 34.2 g/dL (ref 30.0–36.0)
MCV: 101.6 fL — ABNORMAL HIGH (ref 80.0–100.0)
Monocytes Absolute: 0.7 10*3/uL (ref 0.1–1.0)
Monocytes Relative: 8 %
Neutro Abs: 6.7 10*3/uL (ref 1.7–7.7)
Neutrophils Relative %: 81 %
Platelets: 156 10*3/uL (ref 150–400)
RBC: 3.83 MIL/uL — ABNORMAL LOW (ref 3.87–5.11)
RDW: 11.9 % (ref 11.5–15.5)
WBC: 8.3 10*3/uL (ref 4.0–10.5)
nRBC: 0 % (ref 0.0–0.2)

## 2020-08-02 LAB — HEMOGLOBIN A1C
Hgb A1c MFr Bld: 5 % (ref 4.8–5.6)
Mean Plasma Glucose: 96.8 mg/dL

## 2020-08-02 LAB — I-STAT CHEM 8, ED
BUN: 14 mg/dL (ref 8–23)
Calcium, Ion: 1.13 mmol/L — ABNORMAL LOW (ref 1.15–1.40)
Chloride: 100 mmol/L (ref 98–111)
Creatinine, Ser: 0.9 mg/dL (ref 0.44–1.00)
Glucose, Bld: 140 mg/dL — ABNORMAL HIGH (ref 70–99)
HCT: 39 % (ref 36.0–46.0)
Hemoglobin: 13.3 g/dL (ref 12.0–15.0)
Potassium: 3.2 mmol/L — ABNORMAL LOW (ref 3.5–5.1)
Sodium: 139 mmol/L (ref 135–145)
TCO2: 29 mmol/L (ref 22–32)

## 2020-08-02 LAB — TSH: TSH: 1.332 u[IU]/mL (ref 0.350–4.500)

## 2020-08-02 LAB — TROPONIN I (HIGH SENSITIVITY)
Troponin I (High Sensitivity): 24 ng/L — ABNORMAL HIGH (ref <18)
Troponin I (High Sensitivity): 25 ng/L — ABNORMAL HIGH (ref <18)
Troponin I (High Sensitivity): 27 ng/L — ABNORMAL HIGH (ref <18)

## 2020-08-02 LAB — MAGNESIUM: Magnesium: 2.1 mg/dL (ref 1.7–2.4)

## 2020-08-02 LAB — RESP PANEL BY RT-PCR (FLU A&B, COVID) ARPGX2
Influenza A by PCR: NEGATIVE
Influenza B by PCR: NEGATIVE
SARS Coronavirus 2 by RT PCR: NEGATIVE

## 2020-08-02 LAB — PHENYTOIN LEVEL, TOTAL: Phenytoin Lvl: 2.9 ug/mL — ABNORMAL LOW (ref 10.0–20.0)

## 2020-08-02 MED ORDER — PHENYTOIN SODIUM EXTENDED 100 MG PO CAPS
100.0000 mg | ORAL_CAPSULE | Freq: Every day | ORAL | Status: DC
Start: 2020-08-03 — End: 2020-08-12
  Administered 2020-08-03 – 2020-08-12 (×10): 100 mg via ORAL
  Filled 2020-08-02 (×10): qty 1

## 2020-08-02 MED ORDER — PHENYTOIN SODIUM EXTENDED 100 MG PO CAPS: 100.0000 mg | ORAL_CAPSULE | Freq: Two times a day (BID) | ORAL | Status: DC

## 2020-08-02 MED ORDER — POTASSIUM CHLORIDE CRYS ER 20 MEQ PO TBCR
40.0000 meq | EXTENDED_RELEASE_TABLET | Freq: Once | ORAL | Status: AC
  Administered 2020-08-02: 15:00:00 40 meq via ORAL
  Filled 2020-08-02: qty 2

## 2020-08-02 MED ORDER — LACOSAMIDE 50 MG PO TABS
200.0000 mg | ORAL_TABLET | Freq: Two times a day (BID) | ORAL | Status: DC
  Administered 2020-08-02 – 2020-08-12 (×20): 200 mg via ORAL
  Filled 2020-08-02 (×20): qty 4

## 2020-08-02 MED ORDER — PHENYTOIN SODIUM EXTENDED 100 MG PO CAPS
200.0000 mg | ORAL_CAPSULE | Freq: Every day | ORAL | Status: DC
  Administered 2020-08-02 – 2020-08-11 (×10): 200 mg via ORAL
  Filled 2020-08-02 (×12): qty 2

## 2020-08-02 MED ORDER — CENOBAMATE 100 MG PO TABS
100.0000 mg | ORAL_TABLET | Freq: Every day | ORAL | Status: DC
  Administered 2020-08-03 – 2020-08-12 (×10): 100 mg via ORAL
  Filled 2020-08-02 (×11): qty 1

## 2020-08-02 MED ORDER — HYDROMORPHONE HCL 1 MG/ML IJ SOLN
1.0000 mg | INTRAMUSCULAR | Status: DC | PRN
  Administered 2020-08-04 – 2020-08-05 (×2): 1 mg via INTRAVENOUS
  Filled 2020-08-02 (×2): qty 1

## 2020-08-02 MED ORDER — SODIUM CHLORIDE 0.9 % IV SOLN
1.0000 g | Freq: Three times a day (TID) | INTRAVENOUS | Status: DC
  Administered 2020-08-02 – 2020-08-04 (×6): 1 g via INTRAVENOUS
  Filled 2020-08-02 (×8): qty 1

## 2020-08-02 MED ORDER — PHENYTOIN SODIUM EXTENDED 100 MG PO CAPS
200.0000 mg | ORAL_CAPSULE | Freq: Once | ORAL | Status: AC
  Administered 2020-08-02: 17:00:00 200 mg via ORAL
  Filled 2020-08-02: qty 2

## 2020-08-02 MED ORDER — ACETAMINOPHEN 325 MG PO TABS
650.0000 mg | ORAL_TABLET | Freq: Four times a day (QID) | ORAL | Status: DC | PRN
  Administered 2020-08-02 – 2020-08-08 (×13): 650 mg via ORAL
  Filled 2020-08-02 (×13): qty 2

## 2020-08-02 MED ORDER — DILTIAZEM HCL-DEXTROSE 125-5 MG/125ML-% IV SOLN (PREMIX)
5.0000 mg/h | INTRAVENOUS | Status: DC
  Administered 2020-08-02 (×2): 5 mg/h via INTRAVENOUS
  Filled 2020-08-02 (×2): qty 125

## 2020-08-02 MED ORDER — SODIUM CHLORIDE 0.9 % IV SOLN: 2.0000 g | INTRAVENOUS | Status: DC

## 2020-08-02 MED ORDER — VITAMIN D 25 MCG (1000 UNIT) PO TABS
1000.0000 [IU] | ORAL_TABLET | Freq: Every day | ORAL | Status: DC
  Administered 2020-08-03 – 2020-08-12 (×10): 1000 [IU] via ORAL
  Filled 2020-08-02 (×10): qty 1

## 2020-08-02 MED ORDER — SODIUM CHLORIDE 0.9 % IV BOLUS
1000.0000 mL | Freq: Once | INTRAVENOUS | Status: AC
  Administered 2020-08-02: 12:00:00 1000 mL via INTRAVENOUS

## 2020-08-02 MED ORDER — LACTATED RINGERS IV BOLUS
1000.0000 mL | Freq: Once | INTRAVENOUS | Status: AC
  Administered 2020-08-02: 14:00:00 1000 mL via INTRAVENOUS

## 2020-08-02 MED ORDER — PHENYTOIN SODIUM EXTENDED 100 MG PO CAPS: 100.0000 mg | ORAL_CAPSULE | Freq: Once | ORAL | Status: DC

## 2020-08-02 MED ORDER — ENOXAPARIN SODIUM 40 MG/0.4ML ~~LOC~~ SOLN: 40.0000 mg | SUBCUTANEOUS | Status: DC

## 2020-08-02 MED ORDER — IOHEXOL 300 MG/ML  SOLN
100.0000 mL | Freq: Once | INTRAMUSCULAR | Status: AC | PRN
  Administered 2020-08-02: 100 mL via INTRAVENOUS

## 2020-08-02 MED ORDER — ADULT MULTIVITAMIN W/MINERALS CH
1.0000 | ORAL_TABLET | Freq: Every day | ORAL | Status: DC
  Administered 2020-08-02 – 2020-08-12 (×11): 1 via ORAL
  Filled 2020-08-02 (×11): qty 1

## 2020-08-02 MED ORDER — POTASSIUM CHLORIDE CRYS ER 20 MEQ PO TBCR: 40.0000 meq | EXTENDED_RELEASE_TABLET | Freq: Once | ORAL | Status: DC

## 2020-08-02 MED ORDER — DILTIAZEM HCL 25 MG/5ML IV SOLN
10.0000 mg | Freq: Once | INTRAVENOUS | Status: AC
  Administered 2020-08-02: 15:00:00 10 mg via INTRAVENOUS

## 2020-08-02 MED ORDER — SENNOSIDES-DOCUSATE SODIUM 8.6-50 MG PO TABS
2.0000 | ORAL_TABLET | Freq: Every day | ORAL | Status: DC
  Administered 2020-08-02 – 2020-08-11 (×5): 2 via ORAL
  Filled 2020-08-02 (×7): qty 2

## 2020-08-02 MED ORDER — SODIUM CHLORIDE 0.9 % IV SOLN: INTRAVENOUS | Status: DC

## 2020-08-02 MED ORDER — ALBUTEROL SULFATE HFA 108 (90 BASE) MCG/ACT IN AERS
1.0000 | INHALATION_SPRAY | RESPIRATORY_TRACT | Status: DC | PRN
  Filled 2020-08-02: qty 6.7

## 2020-08-02 MED ORDER — ESCITALOPRAM OXALATE 10 MG PO TABS
10.0000 mg | ORAL_TABLET | Freq: Every day | ORAL | Status: DC
  Administered 2020-08-03 – 2020-08-12 (×10): 10 mg via ORAL
  Filled 2020-08-02 (×10): qty 1

## 2020-08-02 MED ORDER — LIDOCAINE HCL (PF) 1 % IJ SOLN
10.0000 mL | Freq: Once | INTRAMUSCULAR | Status: AC
  Administered 2020-08-02: 13:00:00 10 mL
  Filled 2020-08-02: qty 10

## 2020-08-02 MED ORDER — FENTANYL CITRATE (PF) 100 MCG/2ML IJ SOLN
50.0000 ug | Freq: Once | INTRAMUSCULAR | Status: AC
  Administered 2020-08-02: 12:00:00 50 ug via INTRAVENOUS
  Filled 2020-08-02: qty 2

## 2020-08-02 MED ORDER — OXYCODONE HCL 5 MG PO TABS
5.0000 mg | ORAL_TABLET | ORAL | Status: DC | PRN
  Administered 2020-08-05 – 2020-08-09 (×6): 5 mg via ORAL
  Filled 2020-08-02 (×6): qty 1

## 2020-08-02 MED ORDER — GABAPENTIN 100 MG PO CAPS
200.0000 mg | ORAL_CAPSULE | Freq: Every day | ORAL | Status: DC
  Administered 2020-08-02 – 2020-08-11 (×10): 200 mg via ORAL
  Filled 2020-08-02 (×10): qty 2

## 2020-08-02 MED ORDER — VITAMIN B-12 1000 MCG PO TABS
1000.0000 ug | ORAL_TABLET | Freq: Every day | ORAL | Status: DC
  Administered 2020-08-03 – 2020-08-12 (×10): 1000 ug via ORAL
  Filled 2020-08-02 (×10): qty 1

## 2020-08-02 MED ORDER — SODIUM CHLORIDE 0.9 % IV SOLN
1.0000 g | Freq: Once | INTRAVENOUS | Status: AC
  Administered 2020-08-02: 15:00:00 1 g via INTRAVENOUS
  Filled 2020-08-02: qty 10

## 2020-08-02 NOTE — Consult Note (Signed)
Chief Complaint   Chief Complaint  Patient presents with  . Fall  . Seizures   HPI   Consult requested by: Dr Criss Alvine, EDP [facility] Reason for consult: C2 fracture  HPI: ROLENE GORES is a 64 y.o. female with multiple medical comorbidities who presented to the ED after a fall. She was going upstairs and had a syncopal episode vs. Seizure. She developed immediate left hand pain, neck pain and occipital headache.  She underwent work up by EDP and was found to have a C2 latera mass fracture, left hand/finger fracture. A NS consultation was requested for the C2 fracture. Also noted to be in Afib with RVR. Plan is for patient to be admitted for further work up. Neck pain is manageable. No N/T/W.   Patient Active Problem List   Diagnosis Date Noted  . COVID-19 virus infection 04/02/2020  . Lactic acidosis 04/02/2020  . Ataxia 08/26/2019  . Elevated Dilantin level 08/26/2019  . Closed fracture of proximal phalanx of great toe 01/04/2019  . Closed fracture of fifth metatarsal bone 01/04/2019  . Pain in left foot 01/04/2019  . Altered mental status   . Weakness of both lower extremities   . Generalized weakness 05/25/2018  . History of CVA (cerebrovascular accident)   . Pain   . Chronic back pain   . Neuropathy   . Orthostasis   . Tobacco abuse   . Left-sided weakness 03/19/2018  . Atypical chest pain 02/17/2017  . Localized swelling of lower extremity 02/17/2017  . GERD (gastroesophageal reflux disease) 02/17/2017  . Costochondritis 02/17/2017  . Seizures (HCC) 11/02/2015  . Gait disturbance 11/02/2015  . Right clavicle fracture 11/02/2015  . Urinary tract infection associated with catheterization of urinary tract, initial encounter (HCC) 11/02/2015  . Chest pain 03/31/2015  . Acute respiratory failure with hypoxia (HCC) 03/31/2015  . SOB (shortness of breath) 03/31/2015  . Malnutrition of moderate degree (HCC) 03/31/2015  . COPD with exacerbation (HCC) 03/30/2015  . Hypoxia  01/18/2015  . Chronic obstructive pulmonary disease with acute exacerbation (HCC)   . Fever 01/17/2015  . Cough 01/17/2015  . Sepsis secondary to UTI (HCC) 01/17/2015  . COPD (chronic obstructive pulmonary disease) (HCC)   . Unintentional weight loss 01/11/2015  . Anxiety 10/22/2014  . Asthma 05/15/2014  . Seizure (HCC) 05/15/2014  . Protein calorie malnutrition (HCC) 05/15/2014  . Arteriovenous malformation of brain 11/17/2013  . Partial epilepsy with impairment of consciousness, intractable (HCC) 11/17/2013  . Stroke due to intracerebral hemorrhage (HCC) 11/17/2013  . DVT of leg (deep venous thrombosis) (HCC) 06/19/2013  . DDD (degenerative disc disease) 06/12/2013  . Paresthesias 06/12/2013  . Tremor 06/09/2013  . Thrombocytopenia- chronic 06/29/2012  . Noncompliance with medication treatment due to underuse of medication 06/29/2012  . Stress-personal 06/29/2012  . Recurrent seizures (HCC) 06/29/2012  . Tobacco dependence 06/28/2012  . Skull fracture (HCC) 02/28/2012  . Dilantin toxicity 02/28/2012  . Seizure disorder (HCC) 02/28/2012  . History of Intracranial bleed 02/28/2012    PMH: Past Medical History:  Diagnosis Date  . Asthma   . Cerebral hemorrhage (HCC) 1989  . Chronic back pain   . COPD (chronic obstructive pulmonary disease) (HCC)   . DDD (degenerative disc disease) 06/12/2013  . DVT (deep venous thrombosis) (HCC)    "she's had several since 1990"  . GERD (gastroesophageal reflux disease)   . Headache    "usually around time when she's had a seizure"  . History of blood transfusion    "  when she was a baby"  . History of stomach ulcers   . Kidney stones   . Neuropathy   . Seizures ([place])    "because of her brain surgery"  . Stroke [facility]) [phone]   family denies residual on 11/09/2014  . Tunnel vision    "since brain OR"    PSH: Past Surgical History:  Procedure Laterality Date  . [place]   craniotomy with hematoma evacuation  . CESAREAN  SECTION  1979; 1987; 1989  . HEMORRHOID SURGERY    . TUBAL LIGATION  1990's    (Not in a hospital admission)   SH: Social History   Tobacco Use  . Smoking status: Former Smoker    Packs/day: 0.50    Years: 20.00    Pack years: 10.00    Types: Cigarettes    Quit date: 10/26/2017    Years since quitting: 2.7  . Smokeless tobacco: Never Used  . Tobacco comment: Previously smoked 2 ppd, down to 4 cigs/day  Vaping Use  . Vaping Use: Never used  Substance Use Topics  . Alcohol use: No  . Drug use: No    MEDS: Prior to Admission medications   Medication Sig Start Date End Date Taking? Authorizing Provider  acetaminophen (TYLENOL) 500 MG tablet Take 1,000 mg by mouth every 6 (six) hours as needed for mild pain.    [provider]  albuterol (VENTOLIN HFA) 108 (90 Base) MCG/ACT inhaler Inhale 1 puff into the lungs every 4 (four) hours as needed for wheezing or shortness of breath.  03/30/20   [provider]  Cenobamate (XCOPRI) 100 MG TABS Take 1 tablet by mouth daily. Patient taking differently: Take 100 mg by mouth daily. 06/06/20   [name], MD  cholecalciferol (VITAMIN D3) 25 MCG (1000 UNIT) tablet Take 1 tablet (1,000 Units total) by mouth daily. 01/24/20   [name], MD  [facility] B-12 500 MCG tablet TAKE 2 TABLETS BY MOUTH EVERY DAY Patient taking differently: Take 1,000 mcg by mouth daily. 01/24/20   [name], MD  DILANTIN 30 MG ER capsule Take 1 capsule (30 mg total) by mouth in the morning. Along with 100mg  capsule Patient not taking: Reported on 06/06/2020 01/24/20   [name], MD  escitalopram (LEXAPRO) 10 MG tablet Take 1 tablet (10 mg total) by mouth daily. 06/06/20   [name], MD  gabapentin (NEURONTIN) 300 MG capsule Take 1 capsule every night Patient taking differently: Take 300 mg by mouth at bedtime. 01/24/20   [name], MD  lacosamide (VIMPAT) 200 MG TABS tablet Take 1 tablet (200 mg total) by mouth 2 (two) times daily.  07/02/20   Tomi Likens, Adam R, DO  LORazepam (ATIVAN) 1 MG tablet Take 1 tablet as needed for seizure. Do not take more than 2 in 24 hours. Patient taking differently: Take 1 mg by mouth See admin instructions. Take 1 tablet as needed for seizure. Do not take more than 2 in 24 hours. 06/06/20   [name], MD  Multiple Vitamins-Minerals (ZINC PO) Take 1 tablet by mouth daily.    [provider]  phenytoin (DILANTIN) 100 MG ER capsule 100mg  in AM, 130mg  in PM Patient taking differently: Take 100 mg by mouth 2 (two) times daily. 100mg  am and 2 at hs 01/24/20   [name], MD  predniSONE (DELTASONE) 20 MG tablet Take 20 mg by mouth daily with breakfast.  Patient not taking: Reported on  06/06/2020 03/29/20   [provider]    ALLERGY: Allergies  Allergen Reactions  . Zonisamide Other (See Comments)    Numbness and tingling over whole body  . Chocolate Flavor Other (See Comments)    Trigger for Seizure  . Codeine Nausea And Vomiting  . Keppra [Levetiracetam] Other (See Comments)    Causes seizures  . Pregabalin Nausea And Vomiting  . Olive Oil Rash    Social History   Tobacco Use  . Smoking status: Former Smoker    Packs/day: 0.50    Years: 20.00    Pack years: 10.00    Types: Cigarettes    Quit date: 10/26/2017    Years since quitting: 2.7  . Smokeless tobacco: Never Used  . Tobacco comment: Previously smoked 2 ppd, down to 4 cigs/day  Substance Use Topics  . Alcohol use: No     Family History  Problem Relation Age of Onset  . Heart attack Mother        4 MIs, started in her 75s, also vaginal cancer and colon cancer  . Cancer Mother   . Heart attack Father        Died suddenly age 71 - autopsy revealed heart attack her patient  . Cancer Brother        Sinus cancer     ROS   Review of Systems  All other systems reviewed and are negative.   Exam   Vitals:   08/02/20 1430 08/02/20 1445  BP: 130/72 105/66  Pulse: (!) 159 (!) 140  Resp: (!) 33  18  Temp:    SpO2: 94% 98%   General appearance: WDWN, NAD GCS: 15 Eyes: No scleral injection Cardiovascular: Regular rate and rhythm without murmurs, rubs, gallops. No edema or variciosities. Distal pulses normal. Pulmonary: Effort normal, non-labored breathing Musculoskeletal:     Muscle tone upper extremities: Normal    Muscle tone lower extremities: Normal    Motor exam: Upper Extremities Deltoid Bicep Tricep Grip  Right 5/5 5/5 5/5 5/5  Left 5/5 5/5 5/5 5/5   Lower Extremity IP Quad PF DF EHL  Right 5/5 5/5 5/5 5/5 5/5  Left 5/5 5/5 5/5 5/5 5/5   Neurological Mental Status:    - Patient is awake, alert, oriented to person, place, month, year, and situation    - Patient is able to give a clear and coherent history.    - No signs of aphasia or neglect Cranial Nerves    - II: Visual Fields are full. PERRL    - III/IV/VI: EOMI without ptosis or diploplia.     - V: Facial sensation is grossly normal    - VII: Facial movement is symmetric.     - VIII: hearing is intact to voice    - X: Uvula elevates symmetrically    - XI: Shoulder shrug is symmetric.    - XII: tongue is midline without atrophy or fasciculations.  Sensory: Sensation grossly intact to LT   Results - Imaging/Labs   Results for orders placed or performed during the hospital encounter of 08/02/20 (from the past 48 hour(s))  Comprehensive metabolic panel     Status: Abnormal   Collection Time: 08/02/20 11:37 AM  Result Value Ref Range   Sodium 138 135 - 145 mmol/L   Potassium 3.2 (L) 3.5 - 5.1 mmol/L   Chloride 100 98 - 111 mmol/L   CO2 26 22 - 32 mmol/L   Glucose, Bld 146 (H) 70 - 99 mg/dL  Comment: Glucose reference range applies only to samples taken after fasting for at least 8 hours.   BUN 14 8 - 23 mg/dL   Creatinine, Ser 2.70 (H) 0.44 - 1.00 mg/dL   Calcium 8.7 (L) 8.9 - 10.3 mg/dL   Total Protein 6.6 6.5 - 8.1 g/dL   Albumin 2.8 (L) 3.5 - 5.0 g/dL   AST 25 15 - 41 U/L   ALT 21 0 - 44 U/L    Alkaline Phosphatase 54 38 - 126 U/L   Total Bilirubin 0.3 0.3 - 1.2 mg/dL   GFR, Estimated >35 >00 mL/min    Comment: (NOTE) Calculated using the CKD-EPI Creatinine Equation (2021)    Anion gap 12 5 - 15    Comment: Performed at [facility], [address] [place], [place] [address]  Troponin I (High Sensitivity)     Status: Abnormal   Collection Time: 08/02/20 11:37 AM  Result Value Ref Range   Troponin I (High Sensitivity) 25 (H) <18 ng/L    Comment: (NOTE) Elevated high sensitivity troponin I (hsTnI) values and significant  changes across serial measurements may suggest ACS but many other  chronic and acute conditions are known to elevate hsTnI results.  Refer to the "Links" section for chest pain algorithms and additional  guidance. Performed at [facility], [address] [place], [place] [address]   CBC with Differential     Status: Abnormal   Collection Time: 08/02/20 11:37 AM  Result Value Ref Range   WBC 8.3 4.0 - 10.5 K/uL   RBC 3.83 (L) 3.87 - 5.11 MIL/uL   Hemoglobin 13.3 12.0 - 15.0 g/dL   HCT 16.9 67.8 - 93.8 %   MCV 101.6 (H) 80.0 - 100.0 fL   MCH 34.7 (H) 26.0 - 34.0 pg   MCHC 34.2 30.0 - 36.0 g/dL   RDW 10.1 75.1 - 02.5 %   Platelets 156 150 - 400 K/uL   nRBC 0.0 0.0 - 0.2 %   Neutrophils Relative % 81 %   Neutro Abs 6.7 1.7 - 7.7 K/uL   Lymphocytes Relative 11 %   Lymphs Abs 0.9 0.7 - 4.0 K/uL   Monocytes Relative 8 %   Monocytes Absolute 0.7 0.1 - 1.0 K/uL   Eosinophils Relative 0 %   Eosinophils Absolute 0.0 0.0 - 0.5 K/uL   Basophils Relative 0 %   Basophils Absolute 0.0 0.0 - 0.1 K/uL   Immature Granulocytes 0 %   Abs Immature Granulocytes 0.03 0.00 - 0.07 K/uL    Comment: Performed at [facility], [address] [place], [place] [address]  Phenytoin level, total (Dilantin)     Status: Abnormal   Collection Time: 08/02/20 11:40 AM  Result Value Ref Range   Phenytoin Lvl 2.9 (L) 10.0 - 20.0 ug/mL    Comment: Performed at  [facility], [address], [place], [place] [address]  Magnesium     Status: None   Collection Time: 08/02/20 11:40 AM  Result Value Ref Range   Magnesium 2.1 1.7 - 2.4 mg/dL    Comment: Performed at [facility], [address], [place], [place] [address]  Troponin I (High Sensitivity)     Status: Abnormal   Collection Time: 08/02/20 11:40 AM  Result Value Ref Range   Troponin I (High Sensitivity) 24 (H) <18 ng/L    Comment: (NOTE) Elevated high sensitivity troponin I (hsTnI) values and significant  changes across serial measurements  may suggest ACS but many other  chronic and acute conditions are known to elevate hsTnI results.  Refer to the "Links" section for chest pain algorithms and additional  guidance. Performed at [facility], [place] [address], [place], [place] [address]   Resp Panel by RT-PCR (Flu A&B, Covid) Nasopharyngeal Swab     Status: None   Collection Time: 08/02/20 11:56 AM   Specimen: Nasopharyngeal Swab; Nasopharyngeal(NP) swabs in vial transport medium  Result Value Ref Range   SARS Coronavirus 2 by RT PCR NEGATIVE NEGATIVE    Comment: (NOTE) SARS-CoV-2 target nucleic acids are NOT DETECTED.  The SARS-CoV-2 RNA is generally detectable in upper respiratory specimens during the acute phase of infection. The lowest concentration of SARS-CoV-2 viral copies this assay can detect is 138 copies/mL. A negative result does not preclude SARS-Cov-2 infection and should not be used as the sole basis for treatment or other patient management decisions. A negative result may occur with  improper specimen collection/handling, submission of specimen other than nasopharyngeal swab, presence of viral mutation(s) within the areas targeted by this assay, and inadequate number of viral copies(<138 copies/mL). A negative result must be combined with clinical observations, patient history, and epidemiological information. The expected result is  Negative.  Fact Sheet for Patients:  EntrepreneurPulse.com.au  Fact Sheet for Healthcare Providers:  IncredibleEmployment.be  This test is no t yet approved or cleared by the [place] FDA and  has been authorized for detection and/or diagnosis of SARS-CoV-2 by FDA under an Emergency Use Authorization (EUA). This EUA will remain  in effect (meaning this test can be used) for the duration of the COVID-19 declaration under Section 564(b)(1) of the Act, 21 U.S.C.section 360bbb-3(b)(1), unless the authorization is terminated  or revoked sooner.       Influenza A by PCR NEGATIVE NEGATIVE   Influenza B by PCR NEGATIVE NEGATIVE    Comment: (NOTE) The Xpert Xpress SARS-CoV-2/FLU/RSV plus assay is intended as an aid in the diagnosis of influenza from Nasopharyngeal swab specimens and should not be used as a sole basis for treatment. Nasal washings and aspirates are unacceptable for Xpert Xpress SARS-CoV-2/FLU/RSV testing.  Fact Sheet for Patients: EntrepreneurPulse.com.au  Fact Sheet for Healthcare Providers: IncredibleEmployment.be  This test is not yet approved or cleared by the [place] FDA and has been authorized for detection and/or diagnosis of SARS-CoV-2 by FDA under an Emergency Use Authorization (EUA). This EUA will remain in effect (meaning this test can be used) for the duration of the COVID-19 declaration under Section 564(b)(1) of the Act, 21 U.S.C. section 360bbb-3(b)(1), unless the authorization is terminated or revoked.  Performed at [facility], [place] [address], [place], [place] [address]   I-stat chem 8, ED (not at [facility] or [facility])     Status: Abnormal   Collection Time: 08/02/20 12:05 PM  Result Value Ref Range   Sodium 139 135 - 145 mmol/L   Potassium 3.2 (L) 3.5 - 5.1 mmol/L   Chloride 100 98 - 111 mmol/L   BUN 14 8 - 23 mg/dL   Creatinine, Ser 0.90 0.44 - 1.00 mg/dL    Glucose, Bld 140 (H) 70 - 99 mg/dL    Comment: Glucose reference range applies only to samples taken after fasting for at least 8 hours.   Calcium, Ion 1.13 (L) 1.15 - 1.40 mmol/L   TCO2 29 22 - 32 mmol/L   Hemoglobin 13.3 12.0 - 15.0 g/dL   HCT 39.0 36.0 - 46.0 %  CT HEAD WO CONTRAST  Result Date: 08/02/2020 CLINICAL DATA:  Status post fall on stairs today. Neck pain. Initial encounter. EXAM: CT HEAD WITHOUT CONTRAST CT MAXILLOFACIAL WITHOUT CONTRAST CT CERVICAL SPINE WITHOUT CONTRAST TECHNIQUE: Multidetector CT imaging of the head, cervical spine, and maxillofacial structures were performed using the standard protocol without intravenous contrast. Multiplanar CT image reconstructions of the cervical spine and maxillofacial structures were also generated. COMPARISON:  Head and cervical spine CT scan 04/24/2020. FINDINGS: CT HEAD FINDINGS Brain: No evidence of acute infarction, hemorrhage, hydrocephalus, extra-axial collection or mass lesion/mass effect. 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 Vascular: No hyperdense vessel or unexpected calcification. Skull: No acute abnormality.  Remote right craniotomy again seen. Other: None. CT MAXILLOFACIAL FINDINGS Osseous: No fracture or mandibular dislocation. No destructive process. Orbits: Negative. No traumatic or inflammatory finding. Sinuses: Clear. Soft tissues: Negative. CT CERVICAL SPINE FINDINGS Alignment: Maintained. Skull base and vertebrae: The patient has an acute fracture of the anterior aspect of the left lateral mass C2. There is slight anterior displacement of the fracture fragment and minimal impaction. No other fracture is identified. No lytic or sclerotic lesion. Soft tissues and spinal canal: No prevertebral fluid or swelling. No visible canal hematoma. Disc levels: Loss of disc space height and endplate spurring most notable at C5-6 and C6-7. Scattered facet degenerative  change is worst on the left at C4-5. Upper chest: Lung apices are clear with centrilobular emphysema noted. Other: None. IMPRESSION: Mildly impacted and anteriorly displaced fracture of the left lateral mass of C2. No other acute abnormality head, face or cervical spine. Postoperative change of right craniotomy with underlying encephalomalacia, unchanged. 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 Emphysema (ICD10-J43.9). Critical Value/emergent results were called by telephone at the time of interpretation on 08/02/2020 at 1:49 pm to provider Sherwood Gambler , who verbally acknowledged these results. Electronically Signed   By: Inge Rise M.D.   On: 08/02/2020 13:51   CT CERVICAL SPINE WO CONTRAST  Result Date: 08/02/2020 CLINICAL DATA:  Status post fall on stairs today. Neck pain. Initial encounter. EXAM: CT HEAD WITHOUT CONTRAST CT MAXILLOFACIAL WITHOUT CONTRAST CT CERVICAL SPINE WITHOUT CONTRAST TECHNIQUE: Multidetector CT imaging of the head, cervical spine, and maxillofacial structures were performed using the standard protocol without intravenous contrast. Multiplanar CT image reconstructions of the cervical spine and maxillofacial structures were also generated. COMPARISON:  Head and cervical spine CT scan 04/24/2020. FINDINGS: CT HEAD FINDINGS Brain: No evidence of acute infarction, hemorrhage, hydrocephalus, extra-axial collection or mass lesion/mass effect. 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 Vascular: No hyperdense vessel or unexpected calcification. Skull: No acute abnormality.  Remote right craniotomy again seen. Other: None. CT MAXILLOFACIAL FINDINGS Osseous: No fracture or mandibular dislocation. No destructive process. Orbits: Negative. No traumatic or inflammatory finding. Sinuses: Clear. Soft tissues: Negative. CT CERVICAL SPINE FINDINGS Alignment: Maintained. Skull base and vertebrae: The patient has an acute fracture  of the anterior aspect of the left lateral mass C2. There is slight anterior displacement of the fracture fragment and minimal impaction. No other fracture is identified. No lytic or sclerotic lesion. Soft tissues and spinal canal: No prevertebral fluid or swelling. No visible canal hematoma. Disc levels: Loss of disc space height and endplate spurring most notable at C5-6 and C6-7. Scattered facet degenerative change is worst on the left at C4-5. Upper chest: Lung apices are clear with centrilobular emphysema noted. Other: None. IMPRESSION: Mildly impacted and anteriorly displaced fracture of  the left lateral mass of C2. No other acute abnormality head, face or cervical spine. Postoperative change of right craniotomy with underlying encephalomalacia, unchanged. 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 Emphysema (ICD10-J43.9). Critical Value/emergent results were called by telephone at the time of interpretation on 08/02/2020 at 1:49 pm to provider Sherwood Gambler , who verbally acknowledged these results. Electronically Signed   By: Inge Rise M.D.   On: 08/02/2020 13:51   CT CHEST ABDOMEN PELVIS W CONTRAST  Result Date: 08/02/2020 CLINICAL DATA:  64 year old female with a history of fall EXAM: CT CHEST, ABDOMEN, AND PELVIS WITH CONTRAST TECHNIQUE: Multidetector CT imaging of the chest, abdomen and pelvis was performed following the standard protocol during bolus administration of intravenous contrast. CONTRAST:  168mL OMNIPAQUE IOHEXOL 300 MG/ML  SOLN COMPARISON:  Chest CT 08/26/2019, FINDINGS: CT CHEST FINDINGS Cardiovascular: Heart size within normal limits. Mild pericardial fluid/thickening, relatively unchanged from the comparison CT. Calcifications of the aortic valve. No significant coronary calcifications. Unremarkable course caliber and contour of the thoracic aorta. Mild atherosclerotic changes. Branch vessels are patent. Mild atherosclerosis of the thoracic aorta. No filling defects within  the proximal pulmonary arteries. Mediastinum/Nodes: Unremarkable thoracic inlet. Unremarkable thoracic esophagus. Small lymph nodes of the mediastinum. Lungs/Pleura: Paraseptal and centrilobular emphysema. No endotracheal/endobronchial debris. No pneumothorax or pleural effusion. No confluent airspace disease. Atelectatic changes at the left lung base. Musculoskeletal: Degenerative changes of the lower cervical and the thoracic spine. No bony canal narrowing. No acute fracture line identified. No acute displaced rib fracture. CT ABDOMEN PELVIS FINDINGS Hepatobiliary: Unremarkable liver.  Unremarkable gallbladder. Pancreas: Unremarkable Spleen: Unremarkable Adrenals/Urinary Tract: - Right adrenal gland:  Unremarkable - Left adrenal gland: Unremarkable. - Right kidney: No hydronephrosis, nephrolithiasis, inflammation, or ureteral dilation. No focal lesion. - Left Kidney: No hydronephrosis. No nephrolithiasis. There is a wedge-shaped region of heterogeneous enhancement and loss of the corticomedullary junction in the posterior left kidney involving both cortex and the medullary tissue. This is most apparent on the delayed renal imaging. There is a second region more anteriorly decreased attenuation involving the corticomedullary region. No adenopathy. Circumferential enhancement of the urothelium in the left renal pelvis is present, asymmetric from the right. No nephrolithiasis. No ureteral dilation. - Urinary Bladder: Urinary bladder relatively decompressed. Stomach/Bowel: - Stomach: Hiatal hernia.  Otherwise unremarkable stomach. - Small bowel: Unremarkable - Appendix: Normal. - Colon: Unremarkable. Vascular/Lymphatic: Atherosclerotic changes of the lower thoracic aorta and the abdominal aorta. No aneurysm or dissection. Atherosclerotic changes of the bilateral iliac arteries which remain patent. Proximal profunda femoris and SFA patent bilaterally. Unremarkable appearance of the IVC. There is compression of the left  common iliac vein in the typical location below the right common iliac artery in a May-Thurner configuration. Associated small caliber left common iliac vein and external iliac vein. Collateral venous drainage involves the anterior pelvic region from the left great saphenous to the right great saphenous via the pudendal/epigastric vasculature. Engorged left gonadal vein. Reproductive: Unremarkable uterus and adnexa. Other: None Musculoskeletal: Negative for acute fracture or malalignment of the spine. Degenerative changes of the lumbar region. No bony canal narrowing. Minimal degenerative changes of the hips. IMPRESSION: No acute traumatic finding of the chest, abdomen, pelvis CT. There is a wedge shaped lesion within the posterior left kidney, involving both the cortical and medullary tissue with an additional smaller focus of the anterior left kidney. Differential diagnosis for this appearance is pyelonephritis (which is favored) as well as renal malignancy such as renal lymphoma or urothelial carcinoma. Correlation with urinalysis  and/or urine cytology may be useful, as well as short-term interval follow-up contrast-enhanced abdominal CT after therapy. Aortic Atherosclerosis (ICD10-I70.0) and Emphysema (ICD10-J43.9). 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 Correlation with any left-sided chronic venous symptoms may be useful. Electronically Signed   By: Corrie Mckusick D.O.   On: 08/02/2020 13:50   DG Chest Portable 1 View  Result Date: 08/02/2020 CLINICAL DATA:  Chest pain following a fall down stairs. EXAM: PORTABLE CHEST 1 VIEW COMPARISON:  04/02/2020 FINDINGS: Normal sized heart. Mildly tortuous and calcified thoracic aorta. Clear lungs. Old, healed right clavicle fracture. Stable old, healed right 4th lateral rib fracture. Stable mild to moderate thoracolumbar scoliosis. No acute fracture or pneumothorax seen. IMPRESSION: No acute abnormality. Electronically Signed   By: Claudie Revering M.D.   On:  08/02/2020 12:01   DG Hand Complete Left  Result Date: 08/02/2020 CLINICAL DATA:  Left middle finger pain following a fall down stairs. EXAM: LEFT HAND - COMPLETE 3+ VIEW COMPARISON:  03/12/2006 FINDINGS: Acute fracture at the ulnar aspect of the base of the 2nd proximal phalanx extending into the 2nd MCP joint. Mild displacement of the corner fragment. There is also dorsal and proximal dislocation of the 3rd middle phalanx relative to the proximal phalanx without associated visible fracture. IMPRESSION: 1. Fracture of the base of the 2nd proximal phalanx with associated intra-articular extension. 2. Dislocation at the 3rd PIP joint. Electronically Signed   By: Claudie Revering M.D.   On: 08/02/2020 12:04   DG Finger Middle Left  Result Date: 08/02/2020 CLINICAL DATA:  Status post reduction of a left 3rd PIP joint dislocation. EXAM: LEFT MIDDLE FINGER 2+V COMPARISON:  None. FINDINGS: The previously demonstrated 3rd PIP joint dislocation has been reduced. There are tiny avulsion fracture fragments ventral and dorsal to the joint. There is associated soft tissue swelling. The previously noted fracture of the base of the 2nd proximal phalanx is unchanged. IMPRESSION: 1. Status post reduction of the previously demonstrated 3rd PIP joint dislocation with tiny avulsion fracture fragments ventral and dorsal to the joint. 2. Stable fracture of the base of the 2nd proximal phalanx with intra-articular extension. Electronically Signed   By: Claudie Revering M.D.   On: 08/02/2020 12:46   CT Maxillofacial Wo Contrast  Result Date: 08/02/2020 CLINICAL DATA:  Status post fall on stairs today. Neck pain. Initial encounter. EXAM: CT HEAD WITHOUT CONTRAST CT MAXILLOFACIAL WITHOUT CONTRAST CT CERVICAL SPINE WITHOUT CONTRAST TECHNIQUE: Multidetector CT imaging of the head, cervical spine, and maxillofacial structures were performed using the standard protocol without intravenous contrast. Multiplanar CT image reconstructions of  the cervical spine and maxillofacial structures were also generated. COMPARISON:  Head and cervical spine CT scan 04/24/2020. FINDINGS: CT HEAD FINDINGS Brain: No evidence of acute infarction, hemorrhage, hydrocephalus, extra-axial collection or mass lesion/mass effect. 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 Vascular: No hyperdense vessel or unexpected calcification. Skull: No acute abnormality.  Remote right craniotomy again seen. Other: None. CT MAXILLOFACIAL FINDINGS Osseous: No fracture or mandibular dislocation. No destructive process. Orbits: Negative. No traumatic or inflammatory finding. Sinuses: Clear. Soft tissues: Negative. CT CERVICAL SPINE FINDINGS Alignment: Maintained. Skull base and vertebrae: The patient has an acute fracture of the anterior aspect of the left lateral mass C2. There is slight anterior displacement of the fracture fragment and minimal impaction. No other fracture is identified. No lytic or sclerotic lesion. Soft tissues and spinal canal: No prevertebral fluid or swelling. No visible canal hematoma. Disc levels:  Loss of disc space height and endplate spurring most notable at C5-6 and C6-7. Scattered facet degenerative change is worst on the left at C4-5. Upper chest: Lung apices are clear with centrilobular emphysema noted. Other: None. IMPRESSION: Mildly impacted and anteriorly displaced fracture of the left lateral mass of C2. No other acute abnormality head, face or cervical spine. Postoperative change of right craniotomy with underlying encephalomalacia, unchanged. 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 Emphysema (ICD10-J43.9). Critical Value/emergent results were called by telephone at the time of interpretation on 08/02/2020 at 1:49 pm to provider Sherwood Gambler , who verbally acknowledged these results. Electronically Signed   By: Inge Rise M.D.   On: 08/02/2020 13:51   Impression/Plan   64 y.o.  female with multiple injuries including a C2 fracture after a ?syncopal episode. She is neurologically intact.  C2 left lateral mass fracture - minimal anterior displacement, no posterior element involvement - stable fracture that does not require stabilization. - tx with aspen c collar at all times - Plan for outpatient f/u in 2 weeks for monitoring  Syncope vs seizure, Afib with RVR- per primary team  Please call for any concerns.  Ferne Reus, PA-C [facility] [employer]

## 2020-08-02 NOTE — ED Triage Notes (Signed)
Pt presents w/ c/o syncopal episode following seizure-like activity at home witnessed by daughter. Pt states they were walking up stairs when she synopsized striking her head.  Pt endorsing occipital head pain, left middle finger pain and cervical neck pain.

## 2020-08-02 NOTE — Progress Notes (Signed)
Orthopedic Tech Progress Note Patient Details:  [name] 1956-06-06 [id number]  Ortho Devices Type of Ortho Device: Rad Gutter splint Ortho Device/Splint Location: LUE Ortho Device/Splint Interventions: Ordered,Application,Adjustment   Post Interventions Patient Tolerated: Well Instructions Provided: Care of device,Poper ambulation with device   Terrence Pizana 08/02/2020, 4:02 PM

## 2020-08-02 NOTE — ED Provider Notes (Signed)
[place] EMERGENCY DEPARTMENT Provider Note   CSN: [id number] Arrival date & time: 08/02/20  1059     History Chief Complaint  Patient presents with  . Fall  . Seizures    [name] is a 64 y.o. female.  HPI 64 year old female presents with fall down the stairs. She remembers being on the steps and then woke up on the ground. Thinks he might of had a seizure which she has had often. Also discussed with the daughter who was in the house but did not witness it but heard her fall. When the daughter got to where the patient was awake immediately and talkative. This is not typical of when she has seizures, usually has more postictal state. The patient is complaining of a severe left-sided headache. Daughter states she has had some neck issues since a fall before Christmas. Patient is also having chest pain, back pain and neck pain. Her left middle finger also hurts. She is noted to be in A. fib though she has not had any palpitations or history of A. Fib. Chest pain started after the fall.    Past Medical History:  Diagnosis Date  . Asthma   . Cerebral hemorrhage ([place]) 1989  . Chronic back pain   . COPD (chronic obstructive pulmonary disease) ([place])   . DDD (degenerative disc disease) 06/12/2013  . DVT (deep venous thrombosis) ([place])    "she's had several since 1990"  . GERD (gastroesophageal reflux disease)   . Headache    "usually around time when she's had a seizure"  . History of blood transfusion    "when she was a baby"  . History of stomach ulcers   . Kidney stones   . Neuropathy   . Seizures ([place])    "because of her brain surgery"  . Stroke [facility]) [phone]   family denies residual on 11/09/2014  . Tunnel vision    "since brain OR"    Patient Active Problem List   Diagnosis Date Noted  . COVID-19 virus infection 04/02/2020  . Lactic acidosis 04/02/2020  . Ataxia 08/26/2019  . Elevated Dilantin level 08/26/2019  . Closed fracture of proximal  phalanx of great toe 01/04/2019  . Closed fracture of fifth metatarsal bone 01/04/2019  . Pain in left foot 01/04/2019  . Altered mental status   . Weakness of both lower extremities   . Generalized weakness 05/25/2018  . History of CVA (cerebrovascular accident)   . Pain   . Chronic back pain   . Neuropathy   . Orthostasis   . Tobacco abuse   . Left-sided weakness 03/19/2018  . Atypical chest pain 02/17/2017  . Localized swelling of lower extremity 02/17/2017  . GERD (gastroesophageal reflux disease) 02/17/2017  . Costochondritis 02/17/2017  . Seizures ([place]) 11/02/2015  . Gait disturbance 11/02/2015  . Right clavicle fracture 11/02/2015  . Urinary tract infection associated with catheterization of urinary tract, initial encounter ([place]) 11/02/2015  . Chest pain 03/31/2015  . Acute respiratory failure with hypoxia ([place]) 03/31/2015  . SOB (shortness of breath) 03/31/2015  . Malnutrition of moderate degree ([place]) 03/31/2015  . COPD with exacerbation ([place]) 03/30/2015  . Hypoxia 01/18/2015  . Chronic obstructive pulmonary disease with acute exacerbation ([place])   . Fever 01/17/2015  . Cough 01/17/2015  . Sepsis secondary to UTI ([place]) 01/17/2015  . COPD (chronic obstructive pulmonary disease) ([place])   . Unintentional weight loss 01/11/2015  . Anxiety 10/22/2014  . Asthma 05/15/2014  .  Seizure ([place]) 05/15/2014  . Protein calorie malnutrition ([place]) 05/15/2014  . Arteriovenous malformation of brain 11/17/2013  . Partial epilepsy with impairment of consciousness, intractable ([place]) 11/17/2013  . Stroke due to intracerebral hemorrhage ([place]) 11/17/2013  . DVT of leg (deep venous thrombosis) ([place]) 06/19/2013  . DDD (degenerative disc disease) 06/12/2013  . Paresthesias 06/12/2013  . Tremor 06/09/2013  . Thrombocytopenia- chronic 06/29/2012  . Noncompliance with medication treatment due to underuse of medication 06/29/2012  . Stress-personal 06/29/2012  . Recurrent seizures ([place])  06/29/2012  . Tobacco dependence 06/28/2012  . Skull fracture ([place]) 02/28/2012  . Dilantin toxicity 02/28/2012  . Seizure disorder ([place]) 02/28/2012  . History of Intracranial bleed 02/28/2012    Past Surgical History:  Procedure Laterality Date  . [place]   craniotomy with hematoma evacuation  . CESAREAN SECTION  1979; 1987; 1989  . HEMORRHOID SURGERY    . TUBAL LIGATION  1990's     OB History   No obstetric history on file.     Family History  Problem Relation Age of Onset  . Heart attack Mother        4 MIs, started in her 42s, also vaginal cancer and colon cancer  . Cancer Mother   . Heart attack Father        Died suddenly age 44 - autopsy revealed heart attack her patient  . Cancer Brother        Sinus cancer    Social History   Tobacco Use  . Smoking status: Former Smoker    Packs/day: 0.50    Years: 20.00    Pack years: 10.00    Types: Cigarettes    Quit date: 10/26/2017    Years since quitting: 2.7  . Smokeless tobacco: Never Used  . Tobacco comment: Previously smoked 2 ppd, down to 4 cigs/day  Vaping Use  . Vaping Use: Never used  Substance Use Topics  . Alcohol use: No  . Drug use: No    Home Medications Prior to Admission medications   Medication Sig Start Date End Date Taking? Authorizing Provider  acetaminophen (TYLENOL) 500 MG tablet Take 1,000 mg by mouth every 6 (six) hours as needed for mild pain.    [provider]  albuterol (VENTOLIN HFA) 108 (90 Base) MCG/ACT inhaler Inhale 1 puff into the lungs every 4 (four) hours as needed for wheezing or shortness of breath.  03/30/20   [provider]  Cenobamate (XCOPRI) 100 MG TABS Take 1 tablet by mouth daily. Patient taking differently: Take 100 mg by mouth daily. 06/06/20   [name], MD  cholecalciferol (VITAMIN D3) 25 MCG (1000 UNIT) tablet Take 1 tablet (1,000 Units total) by mouth daily. 01/24/20   [name], MD  [facility] B-12 500 MCG tablet TAKE 2 TABLETS  BY MOUTH EVERY DAY Patient taking differently: Take 1,000 mcg by mouth daily. 01/24/20   [name], MD  DILANTIN 30 MG ER capsule Take 1 capsule (30 mg total) by mouth in the morning. Along with 100mg  capsule Patient not taking: Reported on 06/06/2020 01/24/20   [name], MD  escitalopram (LEXAPRO) 10 MG tablet Take 1 tablet (10 mg total) by mouth daily. 06/06/20   [name], MD  gabapentin (NEURONTIN) 300 MG capsule Take 1 capsule every night Patient taking differently: Take 300 mg by mouth at bedtime. 01/24/20   [name], MD  lacosamide (VIMPAT) 200 MG TABS tablet Take 1 tablet (200 mg  total) by mouth 2 (two) times daily. 07/02/20   Everlena Cooper, Adam R, DO  LORazepam (ATIVAN) 1 MG tablet Take 1 tablet as needed for seizure. Do not take more than 2 in 24 hours. Patient taking differently: Take 1 mg by mouth See admin instructions. Take 1 tablet as needed for seizure. Do not take more than 2 in 24 hours. 06/06/20   Van Clines, MD  Multiple Vitamins-Minerals (ZINC PO) Take 1 tablet by mouth daily.    [provider]  phenytoin (DILANTIN) 100 MG ER capsule 100mg  in AM, 130mg  in PM Patient taking differently: Take 100 mg by mouth 2 (two) times daily. 100mg  am and 2 at hs 01/24/20   , MD  predniSONE (DELTASONE) 20 MG tablet Take 20 mg by mouth daily with breakfast.  Patient not taking: Reported on 06/06/2020 03/29/20   [provider]    Allergies    Zonisamide, Chocolate flavor, Codeine, Keppra [levetiracetam], Pregabalin, and Olive oil  Review of Systems   Review of Systems  Constitutional: Negative for fever.  Respiratory: Negative for shortness of breath.   Cardiovascular: Positive for chest pain.  Gastrointestinal: Negative for abdominal pain.  Musculoskeletal: Positive for back pain and neck pain.  Neurological: Positive for syncope and headaches.  All other systems reviewed and are negative.   Physical Exam Updated Vital Signs BP  105/66   Pulse (!) 140   Temp 98.6 F (37 C) (Oral)   Resp 18   SpO2 98%   Physical Exam Vitals and nursing note reviewed.  Constitutional:      Appearance: She is well-developed and well-nourished.     Interventions: Cervical collar in place.  HENT:     Head: Normocephalic and atraumatic.     Right Ear: External ear normal.     Left Ear: External ear normal.     Nose: Nose normal.  Eyes:     General:        Right eye: No discharge.        Left eye: No discharge.  Cardiovascular:     Rate and Rhythm: Tachycardia present. Rhythm irregular.     Heart sounds: Normal heart sounds.  Pulmonary:     Effort: Pulmonary effort is normal. No respiratory distress.     Breath sounds: Normal breath sounds. No wheezing or rales.  Abdominal:     Palpations: Abdomen is soft.     Tenderness: There is no abdominal tenderness.  Musculoskeletal:     Comments: Left middle finger has deformity at PIP joint. Closed. Is tender with decreased ROM  Skin:    General: Skin is warm and dry.  Neurological:     Mental Status: She is alert and oriented to person, place, and time.     Comments: Awake, alert, oriented to person, place, situation and time. Equal strength in all 4 extremities.  Psychiatric:        Mood and Affect: Mood is not anxious.     ED Results / Procedures / Treatments   Labs (all labs ordered are listed, but only abnormal results are displayed) Labs Reviewed  COMPREHENSIVE METABOLIC PANEL - Abnormal; Notable for the following components:      Result Value   Potassium 3.2 (*)    Glucose, Bld 146 (*)    Creatinine, Ser 1.02 (*)    Calcium 8.7 (*)    Albumin 2.8 (*)    All other components within normal limits  CBC WITH DIFFERENTIAL/PLATELET - Abnormal; Notable for  the following components:   RBC 3.83 (*)    MCV 101.6 (*)    MCH 34.7 (*)    All other components within normal limits  URINALYSIS, ROUTINE W REFLEX MICROSCOPIC - Abnormal; Notable for the following components:    Color, Urine AMBER (*)    APPearance HAZY (*)    Specific Gravity, Urine 1.044 (*)    Hgb urine dipstick MODERATE (*)    Protein, ur 100 (*)    Nitrite POSITIVE (*)    Leukocytes,Ua LARGE (*)    WBC, UA >50 (*)    Bacteria, UA MANY (*)    All other components within normal limits  PHENYTOIN LEVEL, TOTAL - Abnormal; Notable for the following components:   Phenytoin Lvl 2.9 (*)    All other components within normal limits  I-STAT CHEM 8, ED - Abnormal; Notable for the following components:   Potassium 3.2 (*)    Glucose, Bld 140 (*)    Calcium, Ion 1.13 (*)    All other components within normal limits  TROPONIN I (HIGH SENSITIVITY) - Abnormal; Notable for the following components:   Troponin I (High Sensitivity) 25 (*)    All other components within normal limits  TROPONIN I (HIGH SENSITIVITY) - Abnormal; Notable for the following components:   Troponin I (High Sensitivity) 24 (*)    All other components within normal limits  RESP PANEL BY RT-PCR (FLU A&B, COVID) ARPGX2  URINE CULTURE  MAGNESIUM  TROPONIN I (HIGH SENSITIVITY)    EKG EKG Interpretation  Date/Time:  Thursday August 02 2020 11:03:13 EST Ventricular Rate:  154 PR Interval:    QRS Duration: 130 QT Interval:  340 QTC Calculation: 545 R Axis:   87 Text Interpretation: Atrial fibrillation with RVR Nonspecific intraventricular conduction delay Borderline repolarization abnormality afib new since Sept 2021 Confirmed by Sherwood Gambler [phone]) on 08/02/2020 11:06:28 AM   Radiology CT HEAD WO CONTRAST  Result Date: 08/02/2020 CLINICAL DATA:  Status post fall on stairs today. Neck pain. Initial encounter. EXAM: CT HEAD WITHOUT CONTRAST CT MAXILLOFACIAL WITHOUT CONTRAST CT CERVICAL SPINE WITHOUT CONTRAST TECHNIQUE: Multidetector CT imaging of the head, cervical spine, and maxillofacial structures were performed using the standard protocol without intravenous contrast. Multiplanar CT image reconstructions of the  cervical spine and maxillofacial structures were also generated. COMPARISON:  Head and cervical spine CT scan 04/24/2020. FINDINGS: CT HEAD FINDINGS Brain: No evidence of acute infarction, hemorrhage, hydrocephalus, extra-axial collection or mass lesion/mass effect. 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 Vascular: No hyperdense vessel or unexpected calcification. Skull: No acute abnormality.  Remote right craniotomy again seen. Other: None. CT MAXILLOFACIAL FINDINGS Osseous: No fracture or mandibular dislocation. No destructive process. Orbits: Negative. No traumatic or inflammatory finding. Sinuses: Clear. Soft tissues: Negative. CT CERVICAL SPINE FINDINGS Alignment: Maintained. Skull base and vertebrae: The patient has an acute fracture of the anterior aspect of the left lateral mass C2. There is slight anterior displacement of the fracture fragment and minimal impaction. No other fracture is identified. No lytic or sclerotic lesion. Soft tissues and spinal canal: No prevertebral fluid or swelling. No visible canal hematoma. Disc levels: Loss of disc space height and endplate spurring most notable at C5-6 and C6-7. Scattered facet degenerative change is worst on the left at C4-5. Upper chest: Lung apices are clear with centrilobular emphysema noted. Other: None. IMPRESSION: Mildly impacted and anteriorly displaced fracture of the left lateral mass of C2. No other acute abnormality head, face or  cervical spine. Postoperative change of right craniotomy with underlying encephalomalacia, unchanged. 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 Emphysema (ICD10-J43.9). Critical Value/emergent results were called by telephone at the time of interpretation on 08/02/2020 at 1:49 pm to provider Sherwood Gambler , who verbally acknowledged these results. Electronically Signed   By: Inge Rise M.D.   On: 08/02/2020 13:51   CT CERVICAL SPINE WO  CONTRAST  Result Date: 08/02/2020 CLINICAL DATA:  Status post fall on stairs today. Neck pain. Initial encounter. EXAM: CT HEAD WITHOUT CONTRAST CT MAXILLOFACIAL WITHOUT CONTRAST CT CERVICAL SPINE WITHOUT CONTRAST TECHNIQUE: Multidetector CT imaging of the head, cervical spine, and maxillofacial structures were performed using the standard protocol without intravenous contrast. Multiplanar CT image reconstructions of the cervical spine and maxillofacial structures were also generated. COMPARISON:  Head and cervical spine CT scan 04/24/2020. FINDINGS: CT HEAD FINDINGS Brain: No evidence of acute infarction, hemorrhage, hydrocephalus, extra-axial collection or mass lesion/mass effect. 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 Vascular: No hyperdense vessel or unexpected calcification. Skull: No acute abnormality.  Remote right craniotomy again seen. Other: None. CT MAXILLOFACIAL FINDINGS Osseous: No fracture or mandibular dislocation. No destructive process. Orbits: Negative. No traumatic or inflammatory finding. Sinuses: Clear. Soft tissues: Negative. CT CERVICAL SPINE FINDINGS Alignment: Maintained. Skull base and vertebrae: The patient has an acute fracture of the anterior aspect of the left lateral mass C2. There is slight anterior displacement of the fracture fragment and minimal impaction. No other fracture is identified. No lytic or sclerotic lesion. Soft tissues and spinal canal: No prevertebral fluid or swelling. No visible canal hematoma. Disc levels: Loss of disc space height and endplate spurring most notable at C5-6 and C6-7. Scattered facet degenerative change is worst on the left at C4-5. Upper chest: Lung apices are clear with centrilobular emphysema noted. Other: None. IMPRESSION: Mildly impacted and anteriorly displaced fracture of the left lateral mass of C2. No other acute abnormality head, face or cervical spine. Postoperative change  of right craniotomy with underlying encephalomalacia, unchanged. 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 Emphysema (ICD10-J43.9). Critical Value/emergent results were called by telephone at the time of interpretation on 08/02/2020 at 1:49 pm to provider Sherwood Gambler , who verbally acknowledged these results. Electronically Signed   By: Inge Rise M.D.   On: 08/02/2020 13:51   CT CHEST ABDOMEN PELVIS W CONTRAST  Result Date: 08/02/2020 CLINICAL DATA:  64 year old female with a history of fall EXAM: CT CHEST, ABDOMEN, AND PELVIS WITH CONTRAST TECHNIQUE: Multidetector CT imaging of the chest, abdomen and pelvis was performed following the standard protocol during bolus administration of intravenous contrast. CONTRAST:  114mL OMNIPAQUE IOHEXOL 300 MG/ML  SOLN COMPARISON:  Chest CT 08/26/2019, FINDINGS: CT CHEST FINDINGS Cardiovascular: Heart size within normal limits. Mild pericardial fluid/thickening, relatively unchanged from the comparison CT. Calcifications of the aortic valve. No significant coronary calcifications. Unremarkable course caliber and contour of the thoracic aorta. Mild atherosclerotic changes. Branch vessels are patent. Mild atherosclerosis of the thoracic aorta. No filling defects within the proximal pulmonary arteries. Mediastinum/Nodes: Unremarkable thoracic inlet. Unremarkable thoracic esophagus. Small lymph nodes of the mediastinum. Lungs/Pleura: Paraseptal and centrilobular emphysema. No endotracheal/endobronchial debris. No pneumothorax or pleural effusion. No confluent airspace disease. Atelectatic changes at the left lung base. Musculoskeletal: Degenerative changes of the lower cervical and the thoracic spine. No bony canal narrowing. No acute fracture line identified. No acute displaced rib fracture. CT ABDOMEN PELVIS FINDINGS Hepatobiliary: Unremarkable liver.  Unremarkable gallbladder. Pancreas: Unremarkable Spleen: Unremarkable Adrenals/Urinary  Tract: - Right adrenal  gland:  Unremarkable - Left adrenal gland: Unremarkable. - Right kidney: No hydronephrosis, nephrolithiasis, inflammation, or ureteral dilation. No focal lesion. - Left Kidney: No hydronephrosis. No nephrolithiasis. There is a wedge-shaped region of heterogeneous enhancement and loss of the corticomedullary junction in the posterior left kidney involving both cortex and the medullary tissue. This is most apparent on the delayed renal imaging. There is a second region more anteriorly decreased attenuation involving the corticomedullary region. No adenopathy. Circumferential enhancement of the urothelium in the left renal pelvis is present, asymmetric from the right. No nephrolithiasis. No ureteral dilation. - Urinary Bladder: Urinary bladder relatively decompressed. Stomach/Bowel: - Stomach: Hiatal hernia.  Otherwise unremarkable stomach. - Small bowel: Unremarkable - Appendix: Normal. - Colon: Unremarkable. Vascular/Lymphatic: Atherosclerotic changes of the lower thoracic aorta and the abdominal aorta. No aneurysm or dissection. Atherosclerotic changes of the bilateral iliac arteries which remain patent. Proximal profunda femoris and SFA patent bilaterally. Unremarkable appearance of the IVC. There is compression of the left common iliac vein in the typical location below the right common iliac artery in a May-Thurner configuration. Associated small caliber left common iliac vein and external iliac vein. Collateral venous drainage involves the anterior pelvic region from the left great saphenous to the right great saphenous via the pudendal/epigastric vasculature. Engorged left gonadal vein. Reproductive: Unremarkable uterus and adnexa. Other: None Musculoskeletal: Negative for acute fracture or malalignment of the spine. Degenerative changes of the lumbar region. No bony canal narrowing. Minimal degenerative changes of the hips. IMPRESSION: No acute traumatic finding of the chest, abdomen, pelvis CT. There is a  wedge shaped lesion within the posterior left kidney, involving both the cortical and medullary tissue with an additional smaller focus of the anterior left kidney. Differential diagnosis for this appearance is pyelonephritis (which is favored) as well as renal malignancy such as renal lymphoma or urothelial carcinoma. Correlation with urinalysis and/or urine cytology may be useful, as well as short-term interval follow-up contrast-enhanced abdominal CT after therapy. Aortic Atherosclerosis (ICD10-I70.0) and Emphysema (ICD10-J43.9). 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 Correlation with any left-sided chronic venous symptoms may be useful. Electronically Signed   By: Corrie Mckusick D.O.   On: 08/02/2020 13:50   DG Chest Portable 1 View  Result Date: 08/02/2020 CLINICAL DATA:  Chest pain following a fall down stairs. EXAM: PORTABLE CHEST 1 VIEW COMPARISON:  04/02/2020 FINDINGS: Normal sized heart. Mildly tortuous and calcified thoracic aorta. Clear lungs. Old, healed right clavicle fracture. Stable old, healed right 4th lateral rib fracture. Stable mild to moderate thoracolumbar scoliosis. No acute fracture or pneumothorax seen. IMPRESSION: No acute abnormality. Electronically Signed   By: Claudie Revering M.D.   On: 08/02/2020 12:01   DG Hand Complete Left  Result Date: 08/02/2020 CLINICAL DATA:  Left middle finger pain following a fall down stairs. EXAM: LEFT HAND - COMPLETE 3+ VIEW COMPARISON:  03/12/2006 FINDINGS: Acute fracture at the ulnar aspect of the base of the 2nd proximal phalanx extending into the 2nd MCP joint. Mild displacement of the corner fragment. There is also dorsal and proximal dislocation of the 3rd middle phalanx relative to the proximal phalanx without associated visible fracture. IMPRESSION: 1. Fracture of the base of the 2nd proximal phalanx with associated intra-articular extension. 2. Dislocation at the 3rd PIP joint. Electronically Signed   By: Claudie Revering M.D.   On:  08/02/2020 12:04   DG Finger Middle Left  Result Date: 08/02/2020 CLINICAL DATA:  Status post reduction of a left  3rd PIP joint dislocation. EXAM: LEFT MIDDLE FINGER 2+V COMPARISON:  None. FINDINGS: The previously demonstrated 3rd PIP joint dislocation has been reduced. There are tiny avulsion fracture fragments ventral and dorsal to the joint. There is associated soft tissue swelling. The previously noted fracture of the base of the 2nd proximal phalanx is unchanged. IMPRESSION: 1. Status post reduction of the previously demonstrated 3rd PIP joint dislocation with tiny avulsion fracture fragments ventral and dorsal to the joint. 2. Stable fracture of the base of the 2nd proximal phalanx with intra-articular extension. Electronically Signed   By: Claudie Revering M.D.   On: 08/02/2020 12:46   CT Maxillofacial Wo Contrast  Result Date: 08/02/2020 CLINICAL DATA:  Status post fall on stairs today. Neck pain. Initial encounter. EXAM: CT HEAD WITHOUT CONTRAST CT MAXILLOFACIAL WITHOUT CONTRAST CT CERVICAL SPINE WITHOUT CONTRAST TECHNIQUE: Multidetector CT imaging of the head, cervical spine, and maxillofacial structures were performed using the standard protocol without intravenous contrast. Multiplanar CT image reconstructions of the cervical spine and maxillofacial structures were also generated. COMPARISON:  Head and cervical spine CT scan 04/24/2020. FINDINGS: CT HEAD FINDINGS Brain: No evidence of acute infarction, hemorrhage, hydrocephalus, extra-axial collection or mass lesion/mass effect. 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 Vascular: No hyperdense vessel or unexpected calcification. Skull: No acute abnormality.  Remote right craniotomy again seen. Other: None. CT MAXILLOFACIAL FINDINGS Osseous: No fracture or mandibular dislocation. No destructive process. Orbits: Negative. No traumatic or inflammatory finding. Sinuses: Clear. Soft tissues:  Negative. CT CERVICAL SPINE FINDINGS Alignment: Maintained. Skull base and vertebrae: The patient has an acute fracture of the anterior aspect of the left lateral mass C2. There is slight anterior displacement of the fracture fragment and minimal impaction. No other fracture is identified. No lytic or sclerotic lesion. Soft tissues and spinal canal: No prevertebral fluid or swelling. No visible canal hematoma. Disc levels: Loss of disc space height and endplate spurring most notable at C5-6 and C6-7. Scattered facet degenerative change is worst on the left at C4-5. Upper chest: Lung apices are clear with centrilobular emphysema noted. Other: None. IMPRESSION: Mildly impacted and anteriorly displaced fracture of the left lateral mass of C2. No other acute abnormality head, face or cervical spine. Postoperative change of right craniotomy with underlying encephalomalacia, unchanged. 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 Emphysema (ICD10-J43.9). Critical Value/emergent results were called by telephone at the time of interpretation on 08/02/2020 at 1:49 pm to provider Sherwood Gambler , who verbally acknowledged these results. Electronically Signed   By: Inge Rise M.D.   On: 08/02/2020 13:51    Procedures Ultrasound ED Peripheral IV (Provider)  Date/Time: 08/02/2020 12:26 PM Performed by: Sherwood Gambler, MD Authorized by: Sherwood Gambler, MD   Procedure details:    Indications: multiple failed IV attempts and poor IV access     Skin Prep: chlorhexidine gluconate     Location:  Left AC   Angiocath:  18 G   Bedside Ultrasound Guided: Yes     Patient tolerated procedure without complications: Yes     Dressing applied: Yes   Reduction of dislocation  Date/Time: 08/02/2020 12:28 PM Performed by: Sherwood Gambler, MD Authorized by: Sherwood Gambler, MD  Consent: Verbal consent obtained. Consent given by: patient Required items: required blood products, implants, devices, and special  equipment available Patient identity confirmed: verbally with patient Preparation: Patient was prepped and draped in the usual sterile fashion. Local anesthesia used: yes Anesthesia: digital block  Anesthesia: Local anesthesia used: yes  Local Anesthetic: lidocaine 1% without epinephrine  Sedation: Patient sedated: no  Patient tolerance: patient tolerated the procedure well with no immediate complications Comments: Left middle finger dislocation reduced without difficulty  .Critical Care Performed by: Sherwood Gambler, MD Authorized by: Sherwood Gambler, MD   Critical care provider statement:    Critical care time (minutes):  45   Critical care time was exclusive of:  Separately billable procedures and treating other patients   Critical care was necessary to treat or prevent imminent or life-threatening deterioration of the following conditions:  Circulatory failure, cardiac failure, shock and trauma   Critical care was time spent personally by me on the following activities:  Discussions with consultants, evaluation of patient's response to treatment, examination of patient, ordering and performing treatments and interventions, ordering and review of laboratory studies, ordering and review of radiographic studies, pulse oximetry, re-evaluation of patient's condition, obtaining history from patient or surrogate and review of old charts   (including critical care time)  Medications Ordered in ED Medications  diltiazem (CARDIZEM) 125 mg in dextrose 5% 125 mL (1 mg/mL) infusion (7.5 mg/hr Intravenous Rate/Dose Change 08/02/20 1457)  potassium chloride SA (KLOR-CON) CR tablet 40 mEq (has no administration in time range)  cefTRIAXone (ROCEPHIN) 1 g in sodium chloride 0.9 % 100 mL IVPB (has no administration in time range)  sodium chloride 0.9 % bolus 1,000 mL (0 mLs Intravenous Stopped 08/02/20 1306)  fentaNYL (SUBLIMAZE) injection 50 mcg (50 mcg Intravenous Given 08/02/20 1204)  lidocaine  (PF) (XYLOCAINE) 1 % injection 10 mL (10 mLs Infiltration Given 08/02/20 1231)  lactated ringers bolus 1,000 mL (0 mLs Intravenous Stopped 08/02/20 1459)  iohexol (OMNIPAQUE) 300 MG/ML solution 100 mL (100 mLs Intravenous Contrast Given 08/02/20 1323)  diltiazem (CARDIZEM) injection 10 mg (10 mg Intravenous Given 08/02/20 1442)    ED Course  I have reviewed the triage vital signs and the nursing notes.  Pertinent labs & imaging results that were available during my care of the patient were reviewed by me and considered in my medical decision making (see chart for details).    MDM Rules/Calculators/A&P                          Patient presents with fall and afib with RVR. Could have been seizure but she was not post-ictal per daughter. Trauma scans obtained given it's unclear how many steps she fell with diffuse pain. Images have been reviewed, has C2 fracture, hand injuries and not much else traumatic. C2 is hard collar per neurosurgery. Ortho recommends radial gutter splint for fracture/dislocation. I have reduced her finger. As for afib, she was given IV fluids for soft BPs. Found to have UTI and pyelo based on urine and CT findings. Given IV rocephin. This UTI is probably causing her afib. She does not feel the afib and hasn't felt well for a couple days per daughter, so it's unclear when it started. Thus I don't think she's a good ED cardioversion candidate. I discussed her low dilantin level with pharmacy who recommends her home dose for now given she missed this morning's dose. Will admit to hospitalist service (Dr. [place] Crane). Final Clinical Impression(s) / ED Diagnoses Final diagnoses:  Fall, initial encounter  Atrial fibrillation with RVR ([place])  Closed displaced fracture of second cervical vertebra, unspecified fracture morphology, initial encounter ([place])  Pyelonephritis  Closed dislocation of finger of left hand, initial encounter  Nondisplaced fracture of proximal phalanx of left index  finger,  initial encounter for closed fracture    Rx / DC Orders ED Discharge Orders    None       Sherwood Gambler, MD 08/02/20 1531

## 2020-08-02 NOTE — ED Notes (Signed)
Help get patient on the monitor did ekg shown

## 2020-08-02 NOTE — Progress Notes (Addendum)
Phenytoin Consult: - Corrected phenytoin level ~3.4  - Patient recent level ~ 1 month ago was therapeutic on Phenytoin 100am and 200 pm dosing  - Patient did have 30 mg tabs at home and was slightly confused about dosing  - Will give 200mg  PO x1 now and resume home dose at this time  - Patient not having seizures at this time and on other agents   Pharmacy Antibiotic Note  ALEASE FAIT is a 64 y.o. female admitted on 08/02/2020 with UTI.  Pharmacy has been consulted for Meropenem dosing.  Height: 5\' 7"  (170.2 cm) Weight: 63 kg (138 lb 14.2 oz) IBW/kg (Calculated) : 61.6  Temp (24hrs), Avg:98.6 F (37 C), Min:98.6 F (37 C), Max:98.6 F (37 C)  Recent Labs  Lab 08/02/20 1137 08/02/20 1205  WBC 8.3  --   CREATININE 1.02* 0.90    Estimated Creatinine Clearance: 63 mL/min (by C-G formula based on SCr of 0.9 mg/dL).    Allergies  Allergen Reactions  . Zonisamide Other (See Comments)    Numbness and tingling over whole body  . Chocolate Flavor Other (See Comments)    Trigger for Seizure  . Codeine Nausea And Vomiting  . Keppra [Levetiracetam] Other (See Comments)    Causes seizures  . Pregabalin Nausea And Vomiting  . Olive Oil Rash    Antimicrobials this admission: 12/30 ceftriaxone >> x1 12/30 Meropenem >>   Dose adjustments this admission: N/a  Microbiology results: Pending   Plan: - Prior ESBL UTI in 2019 has had 2 non esbl UTI in last year  - Start Meropenem 1g IV q8h  - Monitor patient's renal function and urine output   Thank you for allowing pharmacy to be a part of this patient's care.  1/31 PharmD. BCPS  08/02/2020 4:41 PM

## 2020-08-02 NOTE — ED Notes (Signed)
Diltiazem gtt stopped and admitting provider and ED provider notified of low BP.  Admitting provider to bedside. Cards to be consulted

## 2020-08-02 NOTE — H&P (Addendum)
History and Physical  [name] [id number] DOB: March 25, 1956 DOA: 08/02/2020  Referring physician: Dr. Regenia Skeeter, [place] PCP: [name], MD  Outpatient Specialists: Neurology. Patient coming from: Home via EMS. Chief Complaint: Fall.  HPI: [name] is a 64 y.o. 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  Associated with immediate left hand pain, neck pain and occipital headache.  History is obtained from the patient however it is limited due to pain from her fractures.  She does not know whether or not she tripped and fell but she thinks she passed out.  Not postictal.  She is alert and oriented x3.  Reports prior to this event she was having dysuria for a few days.  Denies any fevers or chills.  No nausea or abdominal pain.  No prior cardiopulmonary symptoms.  She had another fall about a week ago and does not remember the circumstances around the fall.  Denies any prior history of A. fib.  Work-up in the ED revealed new onset A. fib with RVR, urine analysis positive for pyuria, C2 lateral mass fracture, left hand/finger fracture, possible left pyelonephritis on CT scan.  She was seen by neurosurgery, recommended conservative management, to wear a c-collar at all times.  She now has a left upper extremity splint in place.  Cardiology was consulted to assist with new onset A. fib with RVR.  TRH was asked to admit for further work-up and management of present condition.  ED Course:  Afebrile, A. fib with RVR with rate in the 150s, soft BPs, serum potassium 3.2, repleted, serum glucose 140.  UA positive for pyuria.  Twelve-lead EKG A. fib with RVR 154, QTc  545.  Review of Systems: Review of systems as noted in the HPI. All other systems reviewed and are negative.   Past Medical History:  Diagnosis Date  . Asthma   . Cerebral hemorrhage ([place]) 1989  . Chronic back pain   . COPD (chronic obstructive pulmonary disease) ([place])   . DDD (degenerative disc disease) 06/12/2013  . DVT (deep venous thrombosis) ([place])    "she's had several since 1990"  . GERD (gastroesophageal reflux disease)   . Headache    "usually around time when she's had a seizure"  . History of blood transfusion    "when she was a baby"  . History of stomach ulcers   . Kidney stones   . Neuropathy   . Seizures ([place])    "because of her brain surgery"  . Stroke [facility]) [phone]   family denies residual on 11/09/2014  . Tunnel vision    "since brain OR"   Past Surgical History:  Procedure Laterality Date  . [place]   craniotomy with hematoma evacuation  . CESAREAN SECTION  1979; 1987; 1989  . HEMORRHOID SURGERY    . TUBAL LIGATION  1990's    Social History:  reports that she quit smoking about 2 years ago. Her smoking use included cigarettes. She has a 10.00 pack-year smoking history. She has never used smokeless tobacco. She reports that she does not drink alcohol and does not use drugs.   Allergies  Allergen Reactions  . Zonisamide Other (See Comments)    Numbness  and tingling over whole body  . Chocolate Flavor Other (See Comments)    Trigger for Seizure  . Codeine Nausea And Vomiting  . Keppra [Levetiracetam] Other (See Comments)    Causes seizures  . Pregabalin Nausea And Vomiting  . Olive Oil Rash    Family History  Problem Relation Age of Onset  . Heart attack Mother        4 MIs, started in her 31s, also vaginal cancer and colon cancer  . Cancer Mother   . Heart attack Father        Died suddenly age 85 - autopsy revealed heart attack her patient  . Cancer Brother        Sinus cancer      Prior to Admission medications   Medication  Sig Start Date End Date Taking? Authorizing Provider  acetaminophen (TYLENOL) 500 MG tablet Take 1,000 mg by mouth every 6 (six) hours as needed for mild pain.    [provider]  albuterol (VENTOLIN HFA) 108 (90 Base) MCG/ACT inhaler Inhale 1 puff into the lungs every 4 (four) hours as needed for wheezing or shortness of breath.  03/30/20   [provider]  Cenobamate (XCOPRI) 100 MG TABS Take 1 tablet by mouth daily. Patient taking differently: Take 100 mg by mouth daily. 06/06/20   Van Clines, MD  cholecalciferol (VITAMIN D3) 25 MCG (1000 UNIT) tablet Take 1 tablet (1,000 Units total) by mouth daily. 01/24/20   Van Clines, MD  [employer] B-12 500 MCG tablet TAKE 2 TABLETS BY MOUTH EVERY DAY Patient taking differently: Take 1,000 mcg by mouth daily. 01/24/20   Van Clines, MD  DILANTIN 30 MG ER capsule Take 1 capsule (30 mg total) by mouth in the morning. Along with 100mg  capsule Patient not taking: Reported on 06/06/2020 01/24/20   01/26/20, MD  escitalopram (LEXAPRO) 10 MG tablet Take 1 tablet (10 mg total) by mouth daily. 06/06/20   13/3/21, MD  gabapentin (NEURONTIN) 300 MG capsule Take 1 capsule every night Patient taking differently: Take 300 mg by mouth at bedtime. 01/24/20   01/26/20, MD  lacosamide (VIMPAT) 200 MG TABS tablet Take 1 tablet (200 mg total) by mouth 2 (two) times daily. 07/02/20   07/04/20, Adam R, DO  LORazepam (ATIVAN) 1 MG tablet Take 1 tablet as needed for seizure. Do not take more than 2 in 24 hours. Patient taking differently: Take 1 mg by mouth See admin instructions. Take 1 tablet as needed for seizure. Do not take more than 2 in 24 hours. 06/06/20   13/3/21, MD  Multiple Vitamins-Minerals (ZINC PO) Take 1 tablet by mouth daily.    [provider]  phenytoin (DILANTIN) 100 MG ER capsule 100mg  in AM, 130mg  in PM Patient taking differently: Take 100 mg by mouth 2 (two) times daily. 100mg  am and 2 at hs 01/24/20    , MD  predniSONE (DELTASONE) 20 MG tablet Take 20 mg by mouth daily with breakfast.  Patient not taking: Reported on 06/06/2020 03/29/20   [provider]    Physical Exam: BP 105/66   Pulse (!) 140   Temp 98.6 F (37 C) (Oral)   Resp 18   SpO2 98%   . General: 64 y.o. year-old female well developed well nourished in no acute distress.  Alert and oriented x3.  C-collar in place. . Cardiovascular: Tachycardic, Irregular rate and rhythm with no rubs or gallops.  No thyromegaly or JVD noted.  No lower extremity edema. 2/4 pulses in all 4 extremities. Marland Kitchen Respiratory: Clear to auscultation with no wheezes or rales. Good inspiratory effort. . Abdomen: Soft nontender nondistended with normal bowel sounds x4 quadrants. . Muskuloskeletal: No cyanosis, clubbing or edema noted bilaterally.  Left hand in splint. . Neuro: CN II-XII intact, strength, sensation, reflexes . Skin: No ulcerative lesions noted or rashes . Psychiatry: Judgement and insight appear normal. Mood is appropriate for condition and setting          Labs on Admission:  Basic Metabolic Panel: Recent Labs  Lab 08/02/20 1137 08/02/20 1140 08/02/20 1205  NA 138  --  139  K 3.2*  --  3.2*  CL 100  --  100  CO2 26  --   --   GLUCOSE 146*  --  140*  BUN 14  --  14  CREATININE 1.02*  --  0.90  CALCIUM 8.7*  --   --   MG  --  2.1  --    Liver Function Tests: Recent Labs  Lab 08/02/20 1137  AST 25  ALT 21  ALKPHOS 54  BILITOT 0.3  PROT 6.6  ALBUMIN 2.8*   No results for input(s): LIPASE, AMYLASE in the last 168 hours. No results for input(s): AMMONIA in the last 168 hours. CBC: Recent Labs  Lab 08/02/20 1137 08/02/20 1205  WBC 8.3  --   NEUTROABS 6.7  --   HGB 13.3 13.3  HCT 38.9 39.0  MCV 101.6*  --   PLT 156  --    Cardiac Enzymes: No results for input(s): CKTOTAL, CKMB, CKMBINDEX, TROPONINI in the last 168 hours.  BNP (last 3 results) Recent Labs    08/26/19 0322  BNP 49.6     ProBNP (last 3 results) No results for input(s): PROBNP in the last 8760 hours.  CBG: No results for input(s): GLUCAP in the last 168 hours.  Radiological Exams on Admission: CT HEAD WO CONTRAST  Result Date: 08/02/2020 CLINICAL DATA:  Status post fall on stairs today. Neck pain. Initial encounter. EXAM: CT HEAD WITHOUT CONTRAST CT MAXILLOFACIAL WITHOUT CONTRAST CT CERVICAL SPINE WITHOUT CONTRAST TECHNIQUE: Multidetector CT imaging of the head, cervical spine, and maxillofacial structures were performed using the standard protocol without intravenous contrast. Multiplanar CT image reconstructions of the cervical spine and maxillofacial structures were also generated. COMPARISON:  Head and cervical spine CT scan 04/24/2020. FINDINGS: CT HEAD FINDINGS Brain: No evidence of acute infarction, hemorrhage, hydrocephalus, extra-axial collection or mass lesion/mass effect. 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 Vascular: No hyperdense vessel or unexpected calcification. Skull: No acute abnormality.  Remote right craniotomy again seen. Other: None. CT MAXILLOFACIAL FINDINGS Osseous: No fracture or mandibular dislocation. No destructive process. Orbits: Negative. No traumatic or inflammatory finding. Sinuses: Clear. Soft tissues: Negative. CT CERVICAL SPINE FINDINGS Alignment: Maintained. Skull base and vertebrae: The patient has an acute fracture of the anterior aspect of the left lateral mass C2. There is slight anterior displacement of the fracture fragment and minimal impaction. No other fracture is identified. No lytic or sclerotic lesion. Soft tissues and spinal canal: No prevertebral fluid or swelling. No visible canal hematoma. Disc levels: Loss of disc space height and endplate spurring most notable at C5-6 and C6-7. Scattered facet degenerative change is worst on the left at C4-5. Upper chest: Lung apices are clear with centrilobular  emphysema noted. Other: None. IMPRESSION: Mildly impacted and anteriorly displaced fracture  of the left lateral mass of C2. No other acute abnormality head, face or cervical spine. Postoperative change of right craniotomy with underlying encephalomalacia, unchanged. 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 Emphysema (ICD10-J43.9). Critical Value/emergent results were called by telephone at the time of interpretation on 08/02/2020 at 1:49 pm to provider Sherwood Gambler , who verbally acknowledged these results. Electronically Signed   By: Inge Rise M.D.   On: 08/02/2020 13:51   CT CERVICAL SPINE WO CONTRAST  Result Date: 08/02/2020 CLINICAL DATA:  Status post fall on stairs today. Neck pain. Initial encounter. EXAM: CT HEAD WITHOUT CONTRAST CT MAXILLOFACIAL WITHOUT CONTRAST CT CERVICAL SPINE WITHOUT CONTRAST TECHNIQUE: Multidetector CT imaging of the head, cervical spine, and maxillofacial structures were performed using the standard protocol without intravenous contrast. Multiplanar CT image reconstructions of the cervical spine and maxillofacial structures were also generated. COMPARISON:  Head and cervical spine CT scan 04/24/2020. FINDINGS: CT HEAD FINDINGS Brain: No evidence of acute infarction, hemorrhage, hydrocephalus, extra-axial collection or mass lesion/mass effect. 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 Vascular: No hyperdense vessel or unexpected calcification. Skull: No acute abnormality.  Remote right craniotomy again seen. Other: None. CT MAXILLOFACIAL FINDINGS Osseous: No fracture or mandibular dislocation. No destructive process. Orbits: Negative. No traumatic or inflammatory finding. Sinuses: Clear. Soft tissues: Negative. CT CERVICAL SPINE FINDINGS Alignment: Maintained. Skull base and vertebrae: The patient has an acute fracture of the anterior aspect of the left lateral mass C2. There is slight anterior displacement  of the fracture fragment and minimal impaction. No other fracture is identified. No lytic or sclerotic lesion. Soft tissues and spinal canal: No prevertebral fluid or swelling. No visible canal hematoma. Disc levels: Loss of disc space height and endplate spurring most notable at C5-6 and C6-7. Scattered facet degenerative change is worst on the left at C4-5. Upper chest: Lung apices are clear with centrilobular emphysema noted. Other: None. IMPRESSION: Mildly impacted and anteriorly displaced fracture of the left lateral mass of C2. No other acute abnormality head, face or cervical spine. Postoperative change of right craniotomy with underlying encephalomalacia, unchanged. 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 Emphysema (ICD10-J43.9). Critical Value/emergent results were called by telephone at the time of interpretation on 08/02/2020 at 1:49 pm to provider Sherwood Gambler , who verbally acknowledged these results. Electronically Signed   By: Inge Rise M.D.   On: 08/02/2020 13:51   CT CHEST ABDOMEN PELVIS W CONTRAST  Result Date: 08/02/2020 CLINICAL DATA:  64 year old female with a history of fall EXAM: CT CHEST, ABDOMEN, AND PELVIS WITH CONTRAST TECHNIQUE: Multidetector CT imaging of the chest, abdomen and pelvis was performed following the standard protocol during bolus administration of intravenous contrast. CONTRAST:  159mL OMNIPAQUE IOHEXOL 300 MG/ML  SOLN COMPARISON:  Chest CT 08/26/2019, FINDINGS: CT CHEST FINDINGS Cardiovascular: Heart size within normal limits. Mild pericardial fluid/thickening, relatively unchanged from the comparison CT. Calcifications of the aortic valve. No significant coronary calcifications. Unremarkable course caliber and contour of the thoracic aorta. Mild atherosclerotic changes. Branch vessels are patent. Mild atherosclerosis of the thoracic aorta. No filling defects within the proximal pulmonary arteries. Mediastinum/Nodes: Unremarkable thoracic inlet.  Unremarkable thoracic esophagus. Small lymph nodes of the mediastinum. Lungs/Pleura: Paraseptal and centrilobular emphysema. No endotracheal/endobronchial debris. No pneumothorax or pleural effusion. No confluent airspace disease. Atelectatic changes at the left lung base. Musculoskeletal: Degenerative changes of the lower cervical and the thoracic spine. No bony canal narrowing. No acute fracture line identified. No acute displaced rib fracture. CT  ABDOMEN PELVIS FINDINGS Hepatobiliary: Unremarkable liver.  Unremarkable gallbladder. Pancreas: Unremarkable Spleen: Unremarkable Adrenals/Urinary Tract: - Right adrenal gland:  Unremarkable - Left adrenal gland: Unremarkable. - Right kidney: No hydronephrosis, nephrolithiasis, inflammation, or ureteral dilation. No focal lesion. - Left Kidney: No hydronephrosis. No nephrolithiasis. There is a wedge-shaped region of heterogeneous enhancement and loss of the corticomedullary junction in the posterior left kidney involving both cortex and the medullary tissue. This is most apparent on the delayed renal imaging. There is a second region more anteriorly decreased attenuation involving the corticomedullary region. No adenopathy. Circumferential enhancement of the urothelium in the left renal pelvis is present, asymmetric from the right. No nephrolithiasis. No ureteral dilation. - Urinary Bladder: Urinary bladder relatively decompressed. Stomach/Bowel: - Stomach: Hiatal hernia.  Otherwise unremarkable stomach. - Small bowel: Unremarkable - Appendix: Normal. - Colon: Unremarkable. Vascular/Lymphatic: Atherosclerotic changes of the lower thoracic aorta and the abdominal aorta. No aneurysm or dissection. Atherosclerotic changes of the bilateral iliac arteries which remain patent. Proximal profunda femoris and SFA patent bilaterally. Unremarkable appearance of the IVC. There is compression of the left common iliac vein in the typical location below the right common iliac artery in  a May-Thurner configuration. Associated small caliber left common iliac vein and external iliac vein. Collateral venous drainage involves the anterior pelvic region from the left great saphenous to the right great saphenous via the pudendal/epigastric vasculature. Engorged left gonadal vein. Reproductive: Unremarkable uterus and adnexa. Other: None Musculoskeletal: Negative for acute fracture or malalignment of the spine. Degenerative changes of the lumbar region. No bony canal narrowing. Minimal degenerative changes of the hips. IMPRESSION: No acute traumatic finding of the chest, abdomen, pelvis CT. There is a wedge shaped lesion within the posterior left kidney, involving both the cortical and medullary tissue with an additional smaller focus of the anterior left kidney. Differential diagnosis for this appearance is pyelonephritis (which is favored) as well as renal malignancy such as renal lymphoma or urothelial carcinoma. Correlation with urinalysis and/or urine cytology may be useful, as well as short-term interval follow-up contrast-enhanced abdominal CT after therapy. Aortic Atherosclerosis (ICD10-I70.0) and Emphysema (ICD10-J43.9). 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 Correlation with any left-sided chronic venous symptoms may be useful. Electronically Signed   By: Corrie Mckusick D.O.   On: 08/02/2020 13:50   DG Chest Portable 1 View  Result Date: 08/02/2020 CLINICAL DATA:  Chest pain following a fall down stairs. EXAM: PORTABLE CHEST 1 VIEW COMPARISON:  04/02/2020 FINDINGS: Normal sized heart. Mildly tortuous and calcified thoracic aorta. Clear lungs. Old, healed right clavicle fracture. Stable old, healed right 4th lateral rib fracture. Stable mild to moderate thoracolumbar scoliosis. No acute fracture or pneumothorax seen. IMPRESSION: No acute abnormality. Electronically Signed   By: Claudie Revering M.D.   On: 08/02/2020 12:01   DG Hand Complete Left  Result Date: 08/02/2020 CLINICAL  DATA:  Left middle finger pain following a fall down stairs. EXAM: LEFT HAND - COMPLETE 3+ VIEW COMPARISON:  03/12/2006 FINDINGS: Acute fracture at the ulnar aspect of the base of the 2nd proximal phalanx extending into the 2nd MCP joint. Mild displacement of the corner fragment. There is also dorsal and proximal dislocation of the 3rd middle phalanx relative to the proximal phalanx without associated visible fracture. IMPRESSION: 1. Fracture of the base of the 2nd proximal phalanx with associated intra-articular extension. 2. Dislocation at the 3rd PIP joint. Electronically Signed   By: Claudie Revering M.D.   On: 08/02/2020 12:04   DG Finger Middle Left  Result Date: 08/02/2020 CLINICAL DATA:  Status post reduction of a left 3rd PIP joint dislocation. EXAM: LEFT MIDDLE FINGER 2+V COMPARISON:  None. FINDINGS: The previously demonstrated 3rd PIP joint dislocation has been reduced. There are tiny avulsion fracture fragments ventral and dorsal to the joint. There is associated soft tissue swelling. The previously noted fracture of the base of the 2nd proximal phalanx is unchanged. IMPRESSION: 1. Status post reduction of the previously demonstrated 3rd PIP joint dislocation with tiny avulsion fracture fragments ventral and dorsal to the joint. 2. Stable fracture of the base of the 2nd proximal phalanx with intra-articular extension. Electronically Signed   By: Claudie Revering M.D.   On: 08/02/2020 12:46   CT Maxillofacial Wo Contrast  Result Date: 08/02/2020 CLINICAL DATA:  Status post fall on stairs today. Neck pain. Initial encounter. EXAM: CT HEAD WITHOUT CONTRAST CT MAXILLOFACIAL WITHOUT CONTRAST CT CERVICAL SPINE WITHOUT CONTRAST TECHNIQUE: Multidetector CT imaging of the head, cervical spine, and maxillofacial structures were performed using the standard protocol without intravenous contrast. Multiplanar CT image reconstructions of the cervical spine and maxillofacial structures were also generated.  COMPARISON:  Head and cervical spine CT scan 04/24/2020. FINDINGS: CT HEAD FINDINGS Brain: No evidence of acute infarction, hemorrhage, hydrocephalus, extra-axial collection or mass lesion/mass effect. 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 Vascular: No hyperdense vessel or unexpected calcification. Skull: No acute abnormality.  Remote right craniotomy again seen. Other: None. CT MAXILLOFACIAL FINDINGS Osseous: No fracture or mandibular dislocation. No destructive process. Orbits: Negative. No traumatic or inflammatory finding. Sinuses: Clear. Soft tissues: Negative. CT CERVICAL SPINE FINDINGS Alignment: Maintained. Skull base and vertebrae: The patient has an acute fracture of the anterior aspect of the left lateral mass C2. There is slight anterior displacement of the fracture fragment and minimal impaction. No other fracture is identified. No lytic or sclerotic lesion. Soft tissues and spinal canal: No prevertebral fluid or swelling. No visible canal hematoma. Disc levels: Loss of disc space height and endplate spurring most notable at C5-6 and C6-7. Scattered facet degenerative change is worst on the left at C4-5. Upper chest: Lung apices are clear with centrilobular emphysema noted. Other: None. IMPRESSION: Mildly impacted and anteriorly displaced fracture of the left lateral mass of C2. No other acute abnormality head, face or cervical spine. Postoperative change of right craniotomy with underlying encephalomalacia, unchanged. 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 Emphysema (ICD10-J43.9). Critical Value/emergent results were called by telephone at the time of interpretation on 08/02/2020 at 1:49 pm to provider Sherwood Gambler , who verbally acknowledged these results. Electronically Signed   By: Inge Rise M.D.   On: 08/02/2020 13:51    EKG: I independently viewed the EKG done and my findings are as followed: Twelve-lead EKG A. fib  with RVR 154, QTc 545.  Nonspecific ST-T changes.  Assessment/Plan Present on Admission: . Fall  Active Problems:   Fall  Fall, unclear etiology, syncope versus seizures Presented after a fall, she has no recollection of the event but she thinks she passed out, she does not believe she had a seizure, not postictal at the time of this visit. Reports another fall that happened about a week ago, she does not remember circumstances around the fall Obtain orthostatic vital signs, 2D echo, bilateral carotid duplex ultrasound Fall precautions, seizure precautions PT OT to assess  New onset A. fib with RVR No prior history of A. Fib Rate in the 150s Currently on Cardizem drip, closely monitor BP while on  the drip to maintain MAP greater than 65. Obtain TSH, 2D echo CHA2DS2-VASc score of 1, may need aspirin or anticoagulation, deferring to cardiology. Cardiology consulted  Elevated troponin likely demand ischemia in the setting of A. fib with RVR Troponin S peaked at 25 and trended down No evidence of acute ischemia on twelve-lead EKG Treat underlying conditions Follow 2D echo  Presumed UTI with suspected pyelonephritis on CT scan History of ESBL E. coli in 2019 Presented with urine analysis positive for pyuria Obtain urine culture and blood cultures peripherally x2 Follow cultures, ID and sensitivities Started on meropenem, de-escalate antibiotics after sensitivities result if indicated Continue IV fluid  C2 left lateral mass fracture post fall Seen by neurosurgery Recommended treatment with aspen c-collar at all times  Left hand/finger fracture post fall She currently has a splint in place Pain control along with bowel regimen  Prolonged QTC Presented with QTC of 545 Avoid QTC prolonging agents Repeat twelve-lead EKG in the morning Optimize potassium and magnesium levels  Seizure disorder Subtherapeutic level of Dilantin, 2.9 Not postictal at the time of this  visit Resume home regimen, she is on several seizure medications Pharmacy assisting with dosage Seizure precautions  Polyneuropathy Resume home Neurontin  Chronic anxiety/depression Stable She is on escitalopram  Hypokalemia Presented with serum potassium 3.2 Magnesium 2.1 Replete and recheck  Hyperglycemia likely stress-induced in the setting of fractures Obtain hemoglobin A1c Monitor for now   DVT prophylaxis: SCDs  Code Status: Full code as stated by the patient herself.  Family Communication: None at bedside.  Disposition Plan: Admit to stepdown unit  Consults called: Neurosurgery, cardiology.  Admission status: Inpatient status.   Status is: Inpatient    Dispo:  Patient From: Home  Planned Disposition: Home  Expected discharge date: 08/04/2020  Medically stable for discharge: No, ongoing management of presumptive UTI, ongoing work-up for syncope.        Kayleen Memos MD [facility] Pager [phone]  If 7PM-7AM, please contact night-coverage [email or web address] Password [facility]  08/02/2020, 3:39 PM

## 2020-08-02 NOTE — Progress Notes (Signed)
°   08/02/20 2020  Vitals  Temp (!) 101.1 F (38.4 C)  Temp Source Oral  Pulse Rate (!) 155  ECG Heart Rate (!) 156  Resp (!) 25  MEWS COLOR  MEWS Score Color Red  Oxygen Therapy  SpO2 95 %  MEWS Score  MEWS Temp 1  MEWS Systolic 1  MEWS Pulse 3  MEWS RR 1  MEWS LOC 0  MEWS Score 6  Provider Notification  Provider Name/Title Opyd  Date Provider Notified 08/02/20  Time Provider Notified 2026  Notification Type Page  Notification Reason Change in status  Response See new orders  Date of Provider Response 08/02/20  Time of Provider Response 2037

## 2020-08-02 NOTE — ED Notes (Signed)
Ortho tech at bedside applying splint. 

## 2020-08-02 NOTE — Progress Notes (Signed)
Pt received from ED. HR 157, BP 94/47, Spo2 99 on 2L [place], Oral temp 100.4. CHG complete. Telemetry applied. Orders for IV Cardizem and no Cardizem infusing. Barret PA paged and confirmed order to start IV Cardizem.  Versie Starks, RN

## 2020-08-02 NOTE — ED Notes (Signed)
Purewick has been placed.  

## 2020-08-02 NOTE — Consult Note (Addendum)
Cardiology Consultation:   Patient ID: [name] MRN: [id number]; DOB: Mar 04, 1956  Admit date: 08/02/2020 Date of Consult: 08/02/2020  Primary Care Provider: [name], MD [facility] Cardiologist: Dr. Debara Pickett    Patient Profile:   [name] is a 64 y.o. 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 [place] Crane.   Family history of CAD with her mother having 4 MIs, first believed to be in her 50s, and father dying suddenly at 75 years old with autopsy showing heart attack.  Patient was seen once by Dr. Debara Pickett when admitted for chest pain July 2018.  Her chest pain felt noncardiac.  Recommended outpatient follow-up but never showed up.  Had COVID-19 pneumonia September 2021.   Longstanding history of tobacco smoking, reports quit 4 years ago.   History of Present Illness:   [name] presented after syncope or fall.  Daughter was in the house but did not witness the event. She heard falling of her mom.  Patient reports that she was going down off a stair and may have lost her balance and fall.  Next thing she remembers waking up with left-sided hand pain, neck pain and headache.  She denies prodromal symptoms of dizziness, chest pain, shortness of breath or palpitation.  Work-up in ER showed C2 fracture and left hand/finger fracture.  Patient was seen by neurosurgery and placed c-collar.  Patient was noted in atrial fibrillation with rapid ventricular rate and cardiology asked for further treatment and evaluation.  Patient already got IV Cardizem 10 mg x 1, and currently on Cardizem drip.  Supplemented for low  potassium.  Potassium 3.2.  Creatinine 1.02>> 0.9.  High-sensitivity troponin 25>>24.  Phenytoin level 2.9.  Respiratory panel negative for influenza and Covid.  Urine analysis suggestive of UTI.  No prior history of hypertension or diabetes mellitus.  She lives with her daughter.  Past Medical History:  Diagnosis Date  . Asthma   . Cerebral hemorrhage ([place]) 1989  . Chronic back pain   . COPD (chronic obstructive pulmonary disease) ([place])   . DDD (degenerative disc disease) 06/12/2013  . DVT (deep venous thrombosis) ([place])    "she's had several since 1990"  . GERD (gastroesophageal reflux disease)   . Headache    "usually around time when she's had a seizure"  . History of blood transfusion    "when she was a baby"  . History of stomach ulcers   . Kidney stones   . Neuropathy   . Seizures ([place])    "because of her brain surgery"  . Stroke [facility]) [phone]   family denies residual on 11/09/2014  . Tunnel vision    "since brain OR"    Past Surgical History:  Procedure Laterality Date  . [place]   craniotomy with hematoma evacuation  . CESAREAN SECTION  1979; 1987; 1989  . HEMORRHOID SURGERY    . TUBAL LIGATION  1990's     Inpatient Medications: Scheduled Meds: . botulinum toxin Type A  200 Units Intramuscular Once  . Cenobamate  100 mg Oral Daily  . [START ON 08/03/2020] cholecalciferol  1,000 Units Oral Daily  . enoxaparin (  LOVENOX) injection  40 mg Subcutaneous Q24H  . escitalopram  10 mg Oral Daily  . gabapentin  200 mg Oral QHS  . lacosamide  200 mg Oral BID  . multivitamin with minerals  1 tablet Oral Daily  . [START ON 08/03/2020] phenytoin  100 mg Oral Daily   And  . phenytoin  200 mg Oral QHS  . phenytoin  200 mg Oral Once  . potassium chloride SA  40 mEq Oral Once  . [START ON 08/03/2020] vitamin B-12  1,000 mcg Oral Daily   Continuous Infusions: . sodium chloride 125 mL/hr at 08/02/20 1628  . diltiazem (CARDIZEM) infusion Stopped (08/02/20 1544)    PRN Meds: albuterol  Allergies:    Allergies  Allergen Reactions  . Zonisamide Other (See Comments)    Numbness and tingling over whole body  . Chocolate Flavor Other (See Comments)    Trigger for Seizure  . Codeine Nausea And Vomiting  . Keppra [Levetiracetam] Other (See Comments)    Causes seizures  . Pregabalin Nausea And Vomiting  . Olive Oil Rash    Social History:   Social History   Socioeconomic History  . Marital status: Divorced    Spouse name: Not on file  . Number of children: 3  . Years of education: Not on file  . Highest education level: Not on file  Occupational History  . Not on file  Tobacco Use  . Smoking status: Former Smoker    Packs/day: 0.50    Years: 20.00    Pack years: 10.00    Types: Cigarettes    Quit date: 10/26/2017    Years since quitting: 2.7  . Smokeless tobacco: Never Used  . Tobacco comment: Previously smoked 2 ppd, down to 4 cigs/day  Vaping Use  . Vaping Use: Never used  Substance and Sexual Activity  . Alcohol use: No  . Drug use: No  . Sexual activity: Never    Birth control/protection: Post-menopausal  Other Topics Concern  . Not on file  Social History Narrative   Pt lives in 2 story home with her daughter, daughter's family and pt's mother-in-law   Has 2 living children / 1 deceased   11/30/22 grade education   Worked for [employer] until 1998 when she became disabled.    Right handed   Social Determinants of Health   Financial Resource Strain: Not on file  Food Insecurity: Not on file  Transportation Needs: Not on file  Physical Activity: Not on file  Stress: Not on file  Social Connections: Not on file  Intimate Partner Violence: Not on file    Family History:   Family History  Problem Relation Age of Onset  . Heart attack Mother        4 MIs, started in her 28s, also vaginal cancer and colon cancer  . Cancer Mother   . Heart attack Father        Died suddenly age 74 - autopsy revealed heart attack her  patient  . Cancer Brother        Sinus cancer     ROS:  Please see the history of present illnesss All other ROS reviewed and negative.     Physical Exam/Data:   Vitals:   08/02/20 1330 08/02/20 1430 08/02/20 1445 08/02/20 1600  BP: 107/65 130/72 105/66   Pulse: (!) 143 (!) 159 (!) 140   Resp: (!) 21 (!) 33 18   Temp:      TempSrc:  SpO2: 98% 94% 98%   Weight:    63 kg  Height:    5\' 7"  (1.702 m)   No intake or output data in the 24 hours ending 08/02/20 1640 Last 3 Weights 08/02/2020 06/06/2020 04/24/2020  Weight (lbs) 138 lb 14.2 oz 138 lb 130 lb  Weight (kg) 63 kg 62.596 kg 58.968 kg     Body mass index is 21.75 kg/m.  General: Thin frail elderly female in no acute distress HEENT: normal Lymph: no adenopathy Neck:C- collar in place Endocrine:  No thryomegaly Vascular: No carotid bruits; FA pulses 2+ bilaterally without bruits  Cardiac:  normal S1, S2; irregular tachycardic no murmur Lungs:  clear to auscultation bilaterally, no wheezing, rhonchi or rales  Abd: soft, nontender, no hepatomegaly  Ext: no edema Musculoskeletal:  No deformities, left arm brace  skin: warm and dry  Neuro:  CNs 2-12 intact, no focal abnormalities noted Psych:  Normal affect   EKG:  The EKG was personally reviewed and demonstrates: Atrial fibrillation at rate of 154 bpm per EKG reading but rhythm looks like atrial flutter  Telemetry:  Telemetry was personally reviewed and demonstrates: Tachycardia at rate of 154 bpm rhythm looks like  a flutter   Relevant CV Studies: As summarized above  Laboratory Data:  High Sensitivity Troponin:   Recent Labs  Lab 08/02/20 1137 08/02/20 1140  TROPONINIHS 25* 24*     Chemistry Recent Labs  Lab 08/02/20 1137 08/02/20 1205  NA 138 139  K 3.2* 3.2*  CL 100 100  CO2 26  --   GLUCOSE 146* 140*  BUN 14 14  CREATININE 1.02* 0.90  CALCIUM 8.7*  --   GFRNONAA >60  --   ANIONGAP 12  --     Recent Labs  Lab 08/02/20 1137  PROT 6.6   ALBUMIN 2.8*  AST 25  ALT 21  ALKPHOS 54  BILITOT 0.3   Hematology Recent Labs  Lab 08/02/20 1137 08/02/20 1205  WBC 8.3  --   RBC 3.83*  --   HGB 13.3 13.3  HCT 38.9 39.0  MCV 101.6*  --   MCH 34.7*  --   MCHC 34.2  --   RDW 11.9  --   PLT 156  --     Radiology/Studies:  CT HEAD WO CONTRAST  Result Date: 08/02/2020 CLINICAL DATA:  Status post fall on stairs today. Neck pain. Initial encounter. EXAM: CT HEAD WITHOUT CONTRAST CT MAXILLOFACIAL WITHOUT CONTRAST CT CERVICAL SPINE WITHOUT CONTRAST TECHNIQUE: Multidetector CT imaging of the head, cervical spine, and maxillofacial structures were performed using the standard protocol without intravenous contrast. Multiplanar CT image reconstructions of the cervical spine and maxillofacial structures were also generated. COMPARISON:  Head and cervical spine CT scan 04/24/2020. FINDINGS: CT HEAD FINDINGS Brain: No evidence of acute infarction, hemorrhage, hydrocephalus, extra-axial collection or mass lesion/mass effect. 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 Vascular: No hyperdense vessel or unexpected calcification. Skull: No acute abnormality.  Remote right craniotomy again seen. Other: None. CT MAXILLOFACIAL FINDINGS Osseous: No fracture or mandibular dislocation. No destructive process. Orbits: Negative. No traumatic or inflammatory finding. Sinuses: Clear. Soft tissues: Negative. CT CERVICAL SPINE FINDINGS Alignment: Maintained. Skull base and vertebrae: The patient has an acute fracture of the anterior aspect of the left lateral mass C2. There is slight anterior displacement of the fracture fragment and minimal impaction. No other fracture is identified. No lytic or sclerotic lesion. Soft tissues and spinal canal: No prevertebral  fluid or swelling. No visible canal hematoma. Disc levels: Loss of disc space height and endplate spurring most notable at C5-6 and C6-7. Scattered  facet degenerative change is worst on the left at C4-5. Upper chest: Lung apices are clear with centrilobular emphysema noted. Other: None. IMPRESSION: Mildly impacted and anteriorly displaced fracture of the left lateral mass of C2. No other acute abnormality head, face or cervical spine. Postoperative change of right craniotomy with underlying encephalomalacia, unchanged. 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 Emphysema (ICD10-J43.9). Critical Value/emergent results were called by telephone at the time of interpretation on 08/02/2020 at 1:49 pm to provider Sherwood Gambler , who verbally acknowledged these results. Electronically Signed   By: Inge Rise M.D.   On: 08/02/2020 13:51   CT CERVICAL SPINE WO CONTRAST  Result Date: 08/02/2020 CLINICAL DATA:  Status post fall on stairs today. Neck pain. Initial encounter. EXAM: CT HEAD WITHOUT CONTRAST CT MAXILLOFACIAL WITHOUT CONTRAST CT CERVICAL SPINE WITHOUT CONTRAST TECHNIQUE: Multidetector CT imaging of the head, cervical spine, and maxillofacial structures were performed using the standard protocol without intravenous contrast. Multiplanar CT image reconstructions of the cervical spine and maxillofacial structures were also generated. COMPARISON:  Head and cervical spine CT scan 04/24/2020. FINDINGS: CT HEAD FINDINGS Brain: No evidence of acute infarction, hemorrhage, hydrocephalus, extra-axial collection or mass lesion/mass effect. 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 Vascular: No hyperdense vessel or unexpected calcification. Skull: No acute abnormality.  Remote right craniotomy again seen. Other: None. CT MAXILLOFACIAL FINDINGS Osseous: No fracture or mandibular dislocation. No destructive process. Orbits: Negative. No traumatic or inflammatory finding. Sinuses: Clear. Soft tissues: Negative. CT CERVICAL SPINE FINDINGS Alignment: Maintained. Skull base and vertebrae: The patient  has an acute fracture of the anterior aspect of the left lateral mass C2. There is slight anterior displacement of the fracture fragment and minimal impaction. No other fracture is identified. No lytic or sclerotic lesion. Soft tissues and spinal canal: No prevertebral fluid or swelling. No visible canal hematoma. Disc levels: Loss of disc space height and endplate spurring most notable at C5-6 and C6-7. Scattered facet degenerative change is worst on the left at C4-5. Upper chest: Lung apices are clear with centrilobular emphysema noted. Other: None. IMPRESSION: Mildly impacted and anteriorly displaced fracture of the left lateral mass of C2. No other acute abnormality head, face or cervical spine. Postoperative change of right craniotomy with underlying encephalomalacia, unchanged. 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 Emphysema (ICD10-J43.9). Critical Value/emergent results were called by telephone at the time of interpretation on 08/02/2020 at 1:49 pm to provider Sherwood Gambler , who verbally acknowledged these results. Electronically Signed   By: Inge Rise M.D.   On: 08/02/2020 13:51   CT CHEST ABDOMEN PELVIS W CONTRAST  Result Date: 08/02/2020 CLINICAL DATA:  64 year old female with a history of fall EXAM: CT CHEST, ABDOMEN, AND PELVIS WITH CONTRAST TECHNIQUE: Multidetector CT imaging of the chest, abdomen and pelvis was performed following the standard protocol during bolus administration of intravenous contrast. CONTRAST:  125mL OMNIPAQUE IOHEXOL 300 MG/ML  SOLN COMPARISON:  Chest CT 08/26/2019, FINDINGS: CT CHEST FINDINGS Cardiovascular: Heart size within normal limits. Mild pericardial fluid/thickening, relatively unchanged from the comparison CT. Calcifications of the aortic valve. No significant coronary calcifications. Unremarkable course caliber and contour of the thoracic aorta. Mild atherosclerotic changes. Branch vessels are patent. Mild atherosclerosis of the thoracic aorta. No  filling defects within the proximal pulmonary arteries. Mediastinum/Nodes: Unremarkable thoracic inlet. Unremarkable thoracic esophagus. Small lymph  nodes of the mediastinum. Lungs/Pleura: Paraseptal and centrilobular emphysema. No endotracheal/endobronchial debris. No pneumothorax or pleural effusion. No confluent airspace disease. Atelectatic changes at the left lung base. Musculoskeletal: Degenerative changes of the lower cervical and the thoracic spine. No bony canal narrowing. No acute fracture line identified. No acute displaced rib fracture. CT ABDOMEN PELVIS FINDINGS Hepatobiliary: Unremarkable liver.  Unremarkable gallbladder. Pancreas: Unremarkable Spleen: Unremarkable Adrenals/Urinary Tract: - Right adrenal gland:  Unremarkable - Left adrenal gland: Unremarkable. - Right kidney: No hydronephrosis, nephrolithiasis, inflammation, or ureteral dilation. No focal lesion. - Left Kidney: No hydronephrosis. No nephrolithiasis. There is a wedge-shaped region of heterogeneous enhancement and loss of the corticomedullary junction in the posterior left kidney involving both cortex and the medullary tissue. This is most apparent on the delayed renal imaging. There is a second region more anteriorly decreased attenuation involving the corticomedullary region. No adenopathy. Circumferential enhancement of the urothelium in the left renal pelvis is present, asymmetric from the right. No nephrolithiasis. No ureteral dilation. - Urinary Bladder: Urinary bladder relatively decompressed. Stomach/Bowel: - Stomach: Hiatal hernia.  Otherwise unremarkable stomach. - Small bowel: Unremarkable - Appendix: Normal. - Colon: Unremarkable. Vascular/Lymphatic: Atherosclerotic changes of the lower thoracic aorta and the abdominal aorta. No aneurysm or dissection. Atherosclerotic changes of the bilateral iliac arteries which remain patent. Proximal profunda femoris and SFA patent bilaterally. Unremarkable appearance of the IVC. There is  compression of the left common iliac vein in the typical location below the right common iliac artery in a May-Thurner configuration. Associated small caliber left common iliac vein and external iliac vein. Collateral venous drainage involves the anterior pelvic region from the left great saphenous to the right great saphenous via the pudendal/epigastric vasculature. Engorged left gonadal vein. Reproductive: Unremarkable uterus and adnexa. Other: None Musculoskeletal: Negative for acute fracture or malalignment of the spine. Degenerative changes of the lumbar region. No bony canal narrowing. Minimal degenerative changes of the hips. IMPRESSION: No acute traumatic finding of the chest, abdomen, pelvis CT. There is a wedge shaped lesion within the posterior left kidney, involving both the cortical and medullary tissue with an additional smaller focus of the anterior left kidney. Differential diagnosis for this appearance is pyelonephritis (which is favored) as well as renal malignancy such as renal lymphoma or urothelial carcinoma. Correlation with urinalysis and/or urine cytology may be useful, as well as short-term interval follow-up contrast-enhanced abdominal CT after therapy. Aortic Atherosclerosis (ICD10-I70.0) and Emphysema (ICD10-J43.9). 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 Correlation with any left-sided chronic venous symptoms may be useful. Electronically Signed   By: Corrie Mckusick D.O.   On: 08/02/2020 13:50   DG Chest Portable 1 View  Result Date: 08/02/2020 CLINICAL DATA:  Chest pain following a fall down stairs. EXAM: PORTABLE CHEST 1 VIEW COMPARISON:  04/02/2020 FINDINGS: Normal sized heart. Mildly tortuous and calcified thoracic aorta. Clear lungs. Old, healed right clavicle fracture. Stable old, healed right 4th lateral rib fracture. Stable mild to moderate thoracolumbar scoliosis. No acute fracture or pneumothorax seen. IMPRESSION: No acute abnormality. Electronically Signed   By:  Claudie Revering M.D.   On: 08/02/2020 12:01   DG Hand Complete Left  Result Date: 08/02/2020 CLINICAL DATA:  Left middle finger pain following a fall down stairs. EXAM: LEFT HAND - COMPLETE 3+ VIEW COMPARISON:  03/12/2006 FINDINGS: Acute fracture at the ulnar aspect of the base of the 2nd proximal phalanx extending into the 2nd MCP joint. Mild displacement of the corner fragment. There is also dorsal and proximal dislocation of the  3rd middle phalanx relative to the proximal phalanx without associated visible fracture. IMPRESSION: 1. Fracture of the base of the 2nd proximal phalanx with associated intra-articular extension. 2. Dislocation at the 3rd PIP joint. Electronically Signed   By: Beckie Salts M.D.   On: 08/02/2020 12:04   DG Finger Middle Left  Result Date: 08/02/2020 CLINICAL DATA:  Status post reduction of a left 3rd PIP joint dislocation. EXAM: LEFT MIDDLE FINGER 2+V COMPARISON:  None. FINDINGS: The previously demonstrated 3rd PIP joint dislocation has been reduced. There are tiny avulsion fracture fragments ventral and dorsal to the joint. There is associated soft tissue swelling. The previously noted fracture of the base of the 2nd proximal phalanx is unchanged. IMPRESSION: 1. Status post reduction of the previously demonstrated 3rd PIP joint dislocation with tiny avulsion fracture fragments ventral and dorsal to the joint. 2. Stable fracture of the base of the 2nd proximal phalanx with intra-articular extension. Electronically Signed   By: Beckie Salts M.D.   On: 08/02/2020 12:46   CT Maxillofacial Wo Contrast  Result Date: 08/02/2020 CLINICAL DATA:  Status post fall on stairs today. Neck pain. Initial encounter. EXAM: CT HEAD WITHOUT CONTRAST CT MAXILLOFACIAL WITHOUT CONTRAST CT CERVICAL SPINE WITHOUT CONTRAST TECHNIQUE: Multidetector CT imaging of the head, cervical spine, and maxillofacial structures were performed using the standard protocol without intravenous contrast. Multiplanar CT  image reconstructions of the cervical spine and maxillofacial structures were also generated. COMPARISON:  Head and cervical spine CT scan 04/24/2020. FINDINGS: CT HEAD FINDINGS Brain: No evidence of acute infarction, hemorrhage, hydrocephalus, extra-axial collection or mass lesion/mass effect. 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 Vascular: No hyperdense vessel or unexpected calcification. Skull: No acute abnormality.  Remote right craniotomy again seen. Other: None. CT MAXILLOFACIAL FINDINGS Osseous: No fracture or mandibular dislocation. No destructive process. Orbits: Negative. No traumatic or inflammatory finding. Sinuses: Clear. Soft tissues: Negative. CT CERVICAL SPINE FINDINGS Alignment: Maintained. Skull base and vertebrae: The patient has an acute fracture of the anterior aspect of the left lateral mass C2. There is slight anterior displacement of the fracture fragment and minimal impaction. No other fracture is identified. No lytic or sclerotic lesion. Soft tissues and spinal canal: No prevertebral fluid or swelling. No visible canal hematoma. Disc levels: Loss of disc space height and endplate spurring most notable at C5-6 and C6-7. Scattered facet degenerative change is worst on the left at C4-5. Upper chest: Lung apices are clear with centrilobular emphysema noted. Other: None. IMPRESSION: Mildly impacted and anteriorly displaced fracture of the left lateral mass of C2. No other acute abnormality head, face or cervical spine. Postoperative change of right craniotomy with underlying encephalomalacia, unchanged. 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 Emphysema (ICD10-J43.9). Critical Value/emergent results were called by telephone at the time of interpretation on 08/02/2020 at 1:49 pm to provider Pricilla Loveless , who verbally acknowledged these results. Electronically Signed   By: Drusilla Kanner M.D.   On: 08/02/2020 13:51      Assessment and Plan:   1. New onset atrial  flutter with rapid ventricular rate 2. Fall vs syncope Presented after fall versus syncope.  Patient reports losing balance and falling down 15 steps.  Daughter was at home but did not witness the event.  No reported seizures tactile state per history.  No prodromal symptoms of chest pain, shortness of breath, palpitation or dizziness.  Her heart rate noted in 150s.  Patient denies palpitation or any cardiac symptoms currently on IV  Cardizem for rate control.  Need to weigh risk and benefits of anticoagulation by neurosurgery given prior hx of intracranial hemorrhage.  Also hx of DVT and seizure (last episode 3 weeks ago).    CHA2DS2-VASc Score = 4  This indicates a 4.8% annual risk of stroke. The patient's score is based upon: CHF History: No HTN History: No Diabetes History: No Stroke History: Yes Vascular Disease History: Yes Age Score: 0 Gender Score: 1  2. C2 fracture - Seen by neurosurgery and plan to tx with [place] C-collar  3. COPD - No active wheezing   4. Hypokalemia - Supplement given   5. Elevated troponin - Demand in setting of acute issue  6. Possible UTI - Per primary team   For questions or updates, please contact [place] Please consult [email or web address] for contact info under  SignedLeanor Kail, PA  08/02/2020 4:40 PM  As above, patient seen and examined.  Briefly she is a 64 year old female with past medical history of asthma, COPD, right occipital AVM/intracranial hemorrhage requiring craniotomy in the 1980s, seizures, prior DVT, Gastrosoft reflux disease, peptic ulcer disease further evaluation of atrial flutter.  Patient denies dyspnea on exertion, orthopnea, PND, pedal edema, palpitations or exertional chest pain.  She has frequent seizures.  Patient states that she was coming downstairs and reached for the railing.  She slipped and fell down 15 steps.  She was brought to the emergency room and  found to have C2 fracture.  She was also noted to be in atrial flutter and cardiology asked to evaluate.  Patient has no symptoms with her flutter.  Electrocardiogram shows atrial flutter, nonspecific ST changes.  Potassium 3.2, hemoglobin 13.3.  1 newly diagnosed atrial flutter-heart rate 150.  We will add low-dose Cardizem for rate control.  Systolic blood pressure at present 106 and will likely not be able to push Cardizem or beta blockade.  We will have a low threshold to add IV amiodarone.  Schedule echocardiogram to assess LV function.  Check TSH. CHADSvasc 3.  However I do not think she is a good candidate for long-term anticoagulation as she has frequent seizures and also history of right occipital AVM/intracranial hemorrhage.  2 hypokalemia-supplement.  3 status post C2 fracture-management per neurosurgery.  4 history of seizures-follow-up with neurology.  Kirk Ruths, MD

## 2020-08-02 NOTE — ED Notes (Signed)
Attempted to give report. Unable to take report at this time

## 2020-08-02 NOTE — ED Notes (Signed)
Spoke to ED provider prior to starting IV antibiotics. Per provider no need for blood cultures at that time.   Pt difficult stick

## 2020-08-03 ENCOUNTER — Inpatient Hospital Stay (HOSPITAL_COMMUNITY): Payer: Medicare Other

## 2020-08-03 ENCOUNTER — Encounter (HOSPITAL_COMMUNITY): Payer: Medicare Other

## 2020-08-03 DIAGNOSIS — S63259A Unspecified dislocation of unspecified finger, initial encounter: Secondary | ICD-10-CM

## 2020-08-03 DIAGNOSIS — R55 Syncope and collapse: Secondary | ICD-10-CM | POA: Diagnosis not present

## 2020-08-03 DIAGNOSIS — I4891 Unspecified atrial fibrillation: Secondary | ICD-10-CM

## 2020-08-03 DIAGNOSIS — I484 Atypical atrial flutter: Secondary | ICD-10-CM

## 2020-08-03 DIAGNOSIS — S12100A Unspecified displaced fracture of second cervical vertebra, initial encounter for closed fracture: Secondary | ICD-10-CM

## 2020-08-03 DIAGNOSIS — W19XXXA Unspecified fall, initial encounter: Secondary | ICD-10-CM | POA: Diagnosis not present

## 2020-08-03 LAB — CBC
HCT: 32.8 % — ABNORMAL LOW (ref 36.0–46.0)
Hemoglobin: 10.8 g/dL — ABNORMAL LOW (ref 12.0–15.0)
MCH: 33.8 pg (ref 26.0–34.0)
MCHC: 32.9 g/dL (ref 30.0–36.0)
MCV: 102.5 fL — ABNORMAL HIGH (ref 80.0–100.0)
Platelets: 110 10*3/uL — ABNORMAL LOW (ref 150–400)
RBC: 3.2 MIL/uL — ABNORMAL LOW (ref 3.87–5.11)
RDW: 11.9 % (ref 11.5–15.5)
WBC: 6.3 10*3/uL (ref 4.0–10.5)
nRBC: 0 % (ref 0.0–0.2)

## 2020-08-03 LAB — ECHOCARDIOGRAM COMPLETE
Area-P 1/2: 3.46 cm2
Calc EF: 60.2 %
Height: 67 in
S' Lateral: 3.3 cm
Single Plane A2C EF: 64.9 %
Single Plane A4C EF: 54.3 %
Weight: 2222.24 oz

## 2020-08-03 LAB — COMPREHENSIVE METABOLIC PANEL
ALT: 16 U/L (ref 0–44)
AST: 23 U/L (ref 15–41)
Albumin: 2.1 g/dL — ABNORMAL LOW (ref 3.5–5.0)
Alkaline Phosphatase: 46 U/L (ref 38–126)
Anion gap: 10 (ref 5–15)
BUN: 8 mg/dL (ref 8–23)
CO2: 21 mmol/L — ABNORMAL LOW (ref 22–32)
Calcium: 7.3 mg/dL — ABNORMAL LOW (ref 8.9–10.3)
Chloride: 109 mmol/L (ref 98–111)
Creatinine, Ser: 0.74 mg/dL (ref 0.44–1.00)
GFR, Estimated: >60 mL/min (ref >60)
Glucose, Bld: 112 mg/dL — ABNORMAL HIGH (ref 70–99)
Potassium: 2.7 mmol/L — CL (ref 3.5–5.1)
Sodium: 140 mmol/L (ref 135–145)
Total Bilirubin: 0.5 mg/dL (ref 0.3–1.2)
Total Protein: 4.8 g/dL — ABNORMAL LOW (ref 6.5–8.1)

## 2020-08-03 LAB — MAGNESIUM: Magnesium: 1.6 mg/dL — ABNORMAL LOW (ref 1.7–2.4)

## 2020-08-03 MED ORDER — AMIODARONE LOAD VIA INFUSION
150.0000 mg | Freq: Once | INTRAVENOUS | Status: AC
  Administered 2020-08-03: 150 mg via INTRAVENOUS
  Filled 2020-08-03: qty 83.34

## 2020-08-03 MED ORDER — MAGNESIUM SULFATE 2 GM/50ML IV SOLN
2.0000 g | Freq: Once | INTRAVENOUS | Status: AC
  Administered 2020-08-03: 2 g via INTRAVENOUS
  Filled 2020-08-03: qty 50

## 2020-08-03 MED ORDER — SODIUM CHLORIDE 0.9% FLUSH
10.0000 mL | Freq: Two times a day (BID) | INTRAVENOUS | Status: DC
  Administered 2020-08-03 – 2020-08-11 (×5): 10 mL

## 2020-08-03 MED ORDER — POTASSIUM CHLORIDE CRYS ER 20 MEQ PO TBCR
40.0000 meq | EXTENDED_RELEASE_TABLET | Freq: Two times a day (BID) | ORAL | Status: AC
  Administered 2020-08-03 (×2): 40 meq via ORAL
  Filled 2020-08-03 (×2): qty 2

## 2020-08-03 MED ORDER — SODIUM CHLORIDE 0.9% FLUSH: 10.0000 mL | INTRAVENOUS | Status: DC | PRN

## 2020-08-03 MED ORDER — AMIODARONE HCL IN DEXTROSE 360-4.14 MG/200ML-% IV SOLN
60.0000 mg/h | INTRAVENOUS | Status: DC
  Administered 2020-08-03: 60 mg/h via INTRAVENOUS
  Filled 2020-08-03: qty 200

## 2020-08-03 MED ORDER — POTASSIUM CHLORIDE 10 MEQ/100ML IV SOLN
10.0000 meq | INTRAVENOUS | Status: AC
  Administered 2020-08-03: 10 meq via INTRAVENOUS
  Filled 2020-08-03: qty 100

## 2020-08-03 MED ORDER — POTASSIUM CHLORIDE CRYS ER 20 MEQ PO TBCR
40.0000 meq | EXTENDED_RELEASE_TABLET | Freq: Once | ORAL | Status: AC
  Administered 2020-08-03: 40 meq via ORAL
  Filled 2020-08-03: qty 2

## 2020-08-03 MED ORDER — AMIODARONE HCL IN DEXTROSE 360-4.14 MG/200ML-% IV SOLN
30.0000 mg/h | INTRAVENOUS | Status: DC
  Administered 2020-08-03 – 2020-08-06 (×7): 30 mg/h via INTRAVENOUS
  Filled 2020-08-03 (×7): qty 200

## 2020-08-03 NOTE — Progress Notes (Signed)
PROGRESS NOTE    [name]  WYO:[id number] DOB: [birth year] DOA: 08/02/2020 PCP: Pearson Grippe, MD    Brief Narrative:  64 y.o. 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  Associated with immediate left hand pain, neck pain and occipital headache.  History is obtained from the patient however it is limited due to pain from her fractures.  She does not know whether or not she tripped and fell but she thinks she passed out.  Not postictal.  She is alert and oriented x3.  Reports prior to this event she was having dysuria for a few days.  Denies any fevers or chills.  No nausea or abdominal pain.  No prior cardiopulmonary symptoms.  She had another fall about a week ago and does not remember the circumstances around the fall.  Denies any prior history of A. fib.  Work-up in the ED revealed new onset A. fib with RVR, urine analysis positive for pyuria, C2 lateral mass fracture, left hand/finger fracture, possible left pyelonephritis on CT scan.  She was seen by neurosurgery, recommended conservative management, to wear a c-collar at all times.  She now has a left upper extremity splint in place.  Cardiology was consulted to assist with new onset A. fib with RVR.  TRH was asked to admit for further work-up and management of present condition.  Assessment & Plan:   Active Problems:   Fall   Fall, unclear etiology -On further questioning this AM, pt reports remembering events of falling and states "missing a step" up a staircase. -carotid doppler pending -2d echo performed, pending results -Therapy recs for CIR when stable  New onset rapid aflutter No prior history  of A. Fib Rate in the 150s Was continued on Cardizem drip, however was later d/c secondary to hypotension HR remains very poorly controlled 2d echo per above Cardiology consulted, recs to start amiodarone IV Recheck bmet in AM  Elevated troponin likely demand ischemia in the setting of A. fib with RVR Troponin S peaked at 25 and trended down Suspect demand ischemia related to rapid heart rate Follow 2D echo Cardiology following  Presumed UTI with suspected pyelonephritis on CT scan History of ESBL E. coli in 2019 Presented with urine analysis positive for pyuria Obtain urine culture and blood cultures peripherally x2 Follow cultures, ID and sensitivities Started on meropenem, pend results of cultures Continue IV fluid  C2 left lateral mass fracture post fall Seen by neurosurgery Recommended treatment with aspen c-collar at all times  Left hand/finger fracture post fall She currently has a splint in place Pain control along with bowel regimen  Prolonged QTC Presented with QTC of 545 Avoid QTC prolonging agents Repeat bmet and Mg in AM, correct to keep K>4 and Mg>2  Seizure disorder Subtherapeutic level of Dilantin, 2.9 Not postictal at the time of this visit Resume home regimen, she is on several seizure medications Pharmacy assisting with dosage Seizure precautions  Polyneuropathy Resume home Neurontin  Chronic anxiety/depression Stable She is on escitalopram  Hypokalemia Presented with serum potassium 3.2 Magnesium 2.1 Cont to replace lytes and repeat levels in AM  Hyperglycemia likely stress-induced in the setting of fractures Hgb A1c 5.0 Monitor for now  Prior  tobacco abuse history -Reported long hx of prior tobacco abuse -Pt elected to quit smoking 4 years ago and remains tobacco free -Pt congratulated  DVT prophylaxis: SCD's Code Status: Full Family Communication: Pt in room, family not at bedside  Status is: Inpatient  Remains  inpatient appropriate because:Hemodynamically unstable, IV treatments appropriate due to intensity of illness or inability to take PO and Inpatient level of care appropriate due to severity of illness   Dispo:  Patient From: Home  Planned Disposition: Home with Health Care Svc  Expected discharge date: 08/06/2020  Medically stable for discharge: No        Consultants:   Cardiology  Procedures:     Antimicrobials: Anti-infectives (From admission, onward)   Start     Dose/Rate Route Frequency Ordered Stop   08/03/20 1500  cefTRIAXone (ROCEPHIN) 2 g in sodium chloride 0.9 % 100 mL IVPB  Status:  Discontinued        2 g 200 mL/hr over 30 Minutes Intravenous Every 24 hours 08/02/20 1547 08/02/20 1557   08/02/20 1730  meropenem (MERREM) 1 g in sodium chloride 0.9 % 100 mL IVPB        1 g 200 mL/hr over 30 Minutes Intravenous Every 8 hours 08/02/20 1641     08/02/20 1500  cefTRIAXone (ROCEPHIN) 1 g in sodium chloride 0.9 % 100 mL IVPB        1 g 200 mL/hr over 30 Minutes Intravenous  Once 08/02/20 1458 08/02/20 1628       Subjective: Complaining of neck discomfort  Objective: Vitals:   08/03/20 0000 08/03/20 0400 08/03/20 0829 08/03/20 1059  BP: 104/63 100/66 (!) 84/65 (!) 92/56  Pulse: (!) 140 (!) 149 (!) 147 (!) 145  Resp: (!) 28 (!) 26 (!) 24 (!) 22  Temp: 98.3 F (36.8 C) 98.6 F (37 C) 98 F (36.7 C) 98.4 F (36.9 C)  TempSrc: Oral Oral Oral Oral  SpO2: 93% 100% 100% 97%  Weight:      Height:        Intake/Output Summary (Last 24 hours) at 08/03/2020 1508 Last data filed at 08/03/2020 1100 Gross per 24 hour  Intake 2316.47 ml  Output 650 ml  Net 1666.47 ml   Filed Weights   08/02/20 1600  Weight: 63 kg    Examination:  General exam: Appears calm and comfortable  Respiratory system: Clear to auscultation. Respiratory effort normal. Cardiovascular system: S1 & S2 heard, tachycardic Gastrointestinal system: Abdomen is nondistended, soft and  nontender. No organomegaly or masses felt. Normal bowel sounds heard. Central nervous system: Alert and oriented. No focal neurological deficits. Extremities: Symmetric 5 x 5 power. Skin: No rashes, lesions Psychiatry: Judgement and insight appear normal. Mood & affect appropriate.   Data Reviewed: I have personally reviewed following labs and imaging studies  CBC: Recent Labs  Lab 08/02/20 1137 08/02/20 1205 08/03/20 0001  WBC 8.3  --  6.3  NEUTROABS 6.7  --   --   HGB 13.3 13.3 10.8*  HCT 38.9 39.0 32.8*  MCV 101.6*  --  102.5*  PLT 156  --  [id number]*   Basic Metabolic Panel: Recent Labs  Lab 08/02/20 1137 08/02/20 1140 08/02/20 1205 08/03/20 0001  NA 138  --  139 140  K 3.2*  --  3.2* 2.7*  CL 100  --  100 109  CO2 26  --   --  21*  GLUCOSE 146*  --  140* 112*  BUN 14  --  14  8  CREATININE 1.02*  --  0.90 0.74  CALCIUM 8.7*  --   --  7.3*  MG  --  2.1  --  1.6*   GFR: Estimated Creatinine Clearance: 70.9 mL/min (by C-G formula based on SCr of 0.74 mg/dL). Liver Function Tests: Recent Labs  Lab 08/02/20 1137 08/03/20 0001  AST 25 23  ALT 21 16  ALKPHOS 54 46  BILITOT 0.3 0.5  PROT 6.6 4.8*  ALBUMIN 2.8* 2.1*   No results for input(s): LIPASE, AMYLASE in the last 168 hours. No results for input(s): AMMONIA in the last 168 hours. Coagulation Profile: No results for input(s): INR, PROTIME in the last 168 hours. Cardiac Enzymes: No results for input(s): CKTOTAL, CKMB, CKMBINDEX, TROPONINI in the last 168 hours. BNP (last 3 results) No results for input(s): PROBNP in the last 8760 hours. HbA1C: Recent Labs    08/02/20 1137  HGBA1C 5.0   CBG: No results for input(s): GLUCAP in the last 168 hours. Lipid Profile: No results for input(s): CHOL, HDL, LDLCALC, TRIG, CHOLHDL, LDLDIRECT in the last 72 hours. Thyroid Function Tests: Recent Labs    08/02/20 1137  TSH 1.332   Anemia Panel: No results for input(s): VITAMINB12, FOLATE, FERRITIN, TIBC, IRON,  RETICCTPCT in the last 72 hours. Sepsis Labs: No results for input(s): PROCALCITON, LATICACIDVEN in the last 168 hours.  Recent Results (from the past 240 hour(s))  Resp Panel by RT-PCR (Flu A&B, Covid) Nasopharyngeal Swab     Status: None   Collection Time: 08/02/20 11:56 AM   Specimen: Nasopharyngeal Swab; Nasopharyngeal(NP) swabs in vial transport medium  Result Value Ref Range Status   SARS Coronavirus 2 by RT PCR NEGATIVE NEGATIVE Final    Comment: (NOTE) SARS-CoV-2 target nucleic acids are NOT DETECTED.  The SARS-CoV-2 RNA is generally detectable in upper respiratory specimens during the acute phase of infection. The lowest concentration of SARS-CoV-2 viral copies this assay can detect is 138 copies/mL. A negative result does not preclude SARS-Cov-2 infection and should not be used as the sole basis for treatment or other patient management decisions. A negative result may occur with  improper specimen collection/handling, submission of specimen other than nasopharyngeal swab, presence of viral mutation(s) within the areas targeted by this assay, and inadequate number of viral copies(<138 copies/mL). A negative result must be combined with clinical observations, patient history, and epidemiological information. The expected result is Negative.  Fact Sheet for Patients:  EntrepreneurPulse.com.au  Fact Sheet for Healthcare Providers:  IncredibleEmployment.be  This test is no t yet approved or cleared by the [place] FDA and  has been authorized for detection and/or diagnosis of SARS-CoV-2 by FDA under an Emergency Use Authorization (EUA). This EUA will remain  in effect (meaning this test can be used) for the duration of the COVID-19 declaration under Section 564(b)(1) of the Act, 21 U.S.C.section 360bbb-3(b)(1), unless the authorization is terminated  or revoked sooner.       Influenza A by PCR NEGATIVE NEGATIVE Final   Influenza  B by PCR NEGATIVE NEGATIVE Final    Comment: (NOTE) The Xpert Xpress SARS-CoV-2/FLU/RSV plus assay is intended as an aid in the diagnosis of influenza from Nasopharyngeal swab specimens and should not be used as a sole basis for treatment. Nasal washings and aspirates are unacceptable for Xpert Xpress SARS-CoV-2/FLU/RSV testing.  Fact Sheet for Patients: EntrepreneurPulse.com.au  Fact Sheet for Healthcare Providers: IncredibleEmployment.be  This test is not yet approved or cleared by the [place] FDA and has been  authorized for detection and/or diagnosis of SARS-CoV-2 by FDA under an Emergency Use Authorization (EUA). This EUA will remain in effect (meaning this test can be used) for the duration of the COVID-19 declaration under Section 564(b)(1) of the Act, 21 U.S.C. section 360bbb-3(b)(1), unless the authorization is terminated or revoked.  Performed at [facility], [place] [address], [place], [place] [address]   Urine culture     Status: Abnormal (Preliminary result)   Collection Time: 08/02/20  2:00 PM   Specimen: Urine, Random  Result Value Ref Range Status   Specimen Description URINE, RANDOM  Final   Special Requests NONE  Final   Culture (A)  Final    >=100,000 COLONIES/mL ESCHERICHIA COLI SUSCEPTIBILITIES TO FOLLOW Performed at [facility], [place] [address], [place], [place] [address]    Report Status PENDING  Incomplete     Radiology Studies: CT HEAD WO CONTRAST  Result Date: 08/02/2020 CLINICAL DATA:  Status post fall on stairs today. Neck pain. Initial encounter. EXAM: CT HEAD WITHOUT CONTRAST CT MAXILLOFACIAL WITHOUT CONTRAST CT CERVICAL SPINE WITHOUT CONTRAST TECHNIQUE: Multidetector CT imaging of the head, cervical spine, and maxillofacial structures were performed using the standard protocol without intravenous contrast. Multiplanar CT image reconstructions of the cervical spine and maxillofacial structures  were also generated. COMPARISON:  Head and cervical spine CT scan 04/24/2020. FINDINGS: CT HEAD FINDINGS Brain: No evidence of acute infarction, hemorrhage, hydrocephalus, extra-axial collection or mass lesion/mass effect. 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 Vascular: No hyperdense vessel or unexpected calcification. Skull: No acute abnormality.  Remote right craniotomy again seen. Other: None. CT MAXILLOFACIAL FINDINGS Osseous: No fracture or mandibular dislocation. No destructive process. Orbits: Negative. No traumatic or inflammatory finding. Sinuses: Clear. Soft tissues: Negative. CT CERVICAL SPINE FINDINGS Alignment: Maintained. Skull base and vertebrae: The patient has an acute fracture of the anterior aspect of the left lateral mass C2. There is slight anterior displacement of the fracture fragment and minimal impaction. No other fracture is identified. No lytic or sclerotic lesion. Soft tissues and spinal canal: No prevertebral fluid or swelling. No visible canal hematoma. Disc levels: Loss of disc space height and endplate spurring most notable at C5-6 and C6-7. Scattered facet degenerative change is worst on the left at C4-5. Upper chest: Lung apices are clear with centrilobular emphysema noted. Other: None. IMPRESSION: Mildly impacted and anteriorly displaced fracture of the left lateral mass of C2. No other acute abnormality head, face or cervical spine. Postoperative change of right craniotomy with underlying encephalomalacia, unchanged. 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 Emphysema (ICD10-J43.9). Critical Value/emergent results were called by telephone at the time of interpretation on 08/02/2020 at 1:49 pm to provider Sherwood Gambler , who verbally acknowledged these results. Electronically Signed   By: Inge Rise M.D.   On: 08/02/2020 13:51   CT CERVICAL SPINE WO CONTRAST  Result Date: 08/02/2020 CLINICAL DATA:   Status post fall on stairs today. Neck pain. Initial encounter. EXAM: CT HEAD WITHOUT CONTRAST CT MAXILLOFACIAL WITHOUT CONTRAST CT CERVICAL SPINE WITHOUT CONTRAST TECHNIQUE: Multidetector CT imaging of the head, cervical spine, and maxillofacial structures were performed using the standard protocol without intravenous contrast. Multiplanar CT image reconstructions of the cervical spine and maxillofacial structures were also generated. COMPARISON:  Head and cervical spine CT scan 04/24/2020. FINDINGS: CT HEAD FINDINGS Brain: No evidence of acute infarction, hemorrhage, hydrocephalus, extra-axial collection or mass lesion/mass effect. 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 Vascular: No hyperdense vessel or unexpected calcification. Skull: No acute abnormality.  Remote right craniotomy again seen. Other: None. CT MAXILLOFACIAL FINDINGS Osseous: No fracture or mandibular dislocation. No destructive process. Orbits: Negative. No traumatic or inflammatory finding. Sinuses: Clear. Soft tissues: Negative. CT CERVICAL SPINE FINDINGS Alignment: Maintained. Skull base and vertebrae: The patient has an acute fracture of the anterior aspect of the left lateral mass C2. There is slight anterior displacement of the fracture fragment and minimal impaction. No other fracture is identified. No lytic or sclerotic lesion. Soft tissues and spinal canal: No prevertebral fluid or swelling. No visible canal hematoma. Disc levels: Loss of disc space height and endplate spurring most notable at C5-6 and C6-7. Scattered facet degenerative change is worst on the left at C4-5. Upper chest: Lung apices are clear with centrilobular emphysema noted. Other: None. IMPRESSION: Mildly impacted and anteriorly displaced fracture of the left lateral mass of C2. No other acute abnormality head, face or cervical spine. Postoperative change of right craniotomy with underlying  encephalomalacia, unchanged. 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 Emphysema (ICD10-J43.9). Critical Value/emergent results were called by telephone at the time of interpretation on 08/02/2020 at 1:49 pm to provider Sherwood Gambler , who verbally acknowledged these results. Electronically Signed   By: Inge Rise M.D.   On: 08/02/2020 13:51   CT CHEST ABDOMEN PELVIS W CONTRAST  Result Date: 08/02/2020 CLINICAL DATA:  64 year old female with a history of fall EXAM: CT CHEST, ABDOMEN, AND PELVIS WITH CONTRAST TECHNIQUE: Multidetector CT imaging of the chest, abdomen and pelvis was performed following the standard protocol during bolus administration of intravenous contrast. CONTRAST:  116mL OMNIPAQUE IOHEXOL 300 MG/ML  SOLN COMPARISON:  Chest CT 08/26/2019, FINDINGS: CT CHEST FINDINGS Cardiovascular: Heart size within normal limits. Mild pericardial fluid/thickening, relatively unchanged from the comparison CT. Calcifications of the aortic valve. No significant coronary calcifications. Unremarkable course caliber and contour of the thoracic aorta. Mild atherosclerotic changes. Branch vessels are patent. Mild atherosclerosis of the thoracic aorta. No filling defects within the proximal pulmonary arteries. Mediastinum/Nodes: Unremarkable thoracic inlet. Unremarkable thoracic esophagus. Small lymph nodes of the mediastinum. Lungs/Pleura: Paraseptal and centrilobular emphysema. No endotracheal/endobronchial debris. No pneumothorax or pleural effusion. No confluent airspace disease. Atelectatic changes at the left lung base. Musculoskeletal: Degenerative changes of the lower cervical and the thoracic spine. No bony canal narrowing. No acute fracture line identified. No acute displaced rib fracture. CT ABDOMEN PELVIS FINDINGS Hepatobiliary: Unremarkable liver.  Unremarkable gallbladder. Pancreas: Unremarkable Spleen: Unremarkable Adrenals/Urinary Tract: - Right adrenal gland:  Unremarkable - Left adrenal  gland: Unremarkable. - Right kidney: No hydronephrosis, nephrolithiasis, inflammation, or ureteral dilation. No focal lesion. - Left Kidney: No hydronephrosis. No nephrolithiasis. There is a wedge-shaped region of heterogeneous enhancement and loss of the corticomedullary junction in the posterior left kidney involving both cortex and the medullary tissue. This is most apparent on the delayed renal imaging. There is a second region more anteriorly decreased attenuation involving the corticomedullary region. No adenopathy. Circumferential enhancement of the urothelium in the left renal pelvis is present, asymmetric from the right. No nephrolithiasis. No ureteral dilation. - Urinary Bladder: Urinary bladder relatively decompressed. Stomach/Bowel: - Stomach: Hiatal hernia.  Otherwise unremarkable stomach. - Small bowel: Unremarkable - Appendix: Normal. - Colon: Unremarkable. Vascular/Lymphatic: Atherosclerotic changes of the lower thoracic aorta and the abdominal aorta. No aneurysm or dissection. Atherosclerotic changes of the bilateral iliac arteries which remain patent. Proximal profunda femoris and SFA patent bilaterally. Unremarkable appearance of the IVC. There is compression  of the left common iliac vein in the typical location below the right common iliac artery in a May-Thurner configuration. Associated small caliber left common iliac vein and external iliac vein. Collateral venous drainage involves the anterior pelvic region from the left great saphenous to the right great saphenous via the pudendal/epigastric vasculature. Engorged left gonadal vein. Reproductive: Unremarkable uterus and adnexa. Other: None Musculoskeletal: Negative for acute fracture or malalignment of the spine. Degenerative changes of the lumbar region. No bony canal narrowing. Minimal degenerative changes of the hips. IMPRESSION: No acute traumatic finding of the chest, abdomen, pelvis CT. There is a wedge shaped lesion within the  posterior left kidney, involving both the cortical and medullary tissue with an additional smaller focus of the anterior left kidney. Differential diagnosis for this appearance is pyelonephritis (which is favored) as well as renal malignancy such as renal lymphoma or urothelial carcinoma. Correlation with urinalysis and/or urine cytology may be useful, as well as short-term interval follow-up contrast-enhanced abdominal CT after therapy. Aortic Atherosclerosis (ICD10-I70.0) and Emphysema (ICD10-J43.9). 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 Correlation with any left-sided chronic venous symptoms may be useful. Electronically Signed   By: Corrie Mckusick D.O.   On: 08/02/2020 13:50   DG Chest Portable 1 View  Result Date: 08/02/2020 CLINICAL DATA:  Chest pain following a fall down stairs. EXAM: PORTABLE CHEST 1 VIEW COMPARISON:  04/02/2020 FINDINGS: Normal sized heart. Mildly tortuous and calcified thoracic aorta. Clear lungs. Old, healed right clavicle fracture. Stable old, healed right 4th lateral rib fracture. Stable mild to moderate thoracolumbar scoliosis. No acute fracture or pneumothorax seen. IMPRESSION: No acute abnormality. Electronically Signed   By: Claudie Revering M.D.   On: 08/02/2020 12:01   DG Hand Complete Left  Result Date: 08/02/2020 CLINICAL DATA:  Left middle finger pain following a fall down stairs. EXAM: LEFT HAND - COMPLETE 3+ VIEW COMPARISON:  03/12/2006 FINDINGS: Acute fracture at the ulnar aspect of the base of the 2nd proximal phalanx extending into the 2nd MCP joint. Mild displacement of the corner fragment. There is also dorsal and proximal dislocation of the 3rd middle phalanx relative to the proximal phalanx without associated visible fracture. IMPRESSION: 1. Fracture of the base of the 2nd proximal phalanx with associated intra-articular extension. 2. Dislocation at the 3rd PIP joint. Electronically Signed   By: Claudie Revering M.D.   On: 08/02/2020 12:04   DG Finger  Middle Left  Result Date: 08/02/2020 CLINICAL DATA:  Status post reduction of a left 3rd PIP joint dislocation. EXAM: LEFT MIDDLE FINGER 2+V COMPARISON:  None. FINDINGS: The previously demonstrated 3rd PIP joint dislocation has been reduced. There are tiny avulsion fracture fragments ventral and dorsal to the joint. There is associated soft tissue swelling. The previously noted fracture of the base of the 2nd proximal phalanx is unchanged. IMPRESSION: 1. Status post reduction of the previously demonstrated 3rd PIP joint dislocation with tiny avulsion fracture fragments ventral and dorsal to the joint. 2. Stable fracture of the base of the 2nd proximal phalanx with intra-articular extension. Electronically Signed   By: Claudie Revering M.D.   On: 08/02/2020 12:46   CT Maxillofacial Wo Contrast  Result Date: 08/02/2020 CLINICAL DATA:  Status post fall on stairs today. Neck pain. Initial encounter. EXAM: CT HEAD WITHOUT CONTRAST CT MAXILLOFACIAL WITHOUT CONTRAST CT CERVICAL SPINE WITHOUT CONTRAST TECHNIQUE: Multidetector CT imaging of the head, cervical spine, and maxillofacial structures were performed using the standard protocol without intravenous contrast. Multiplanar CT image reconstructions of the  cervical spine and maxillofacial structures were also generated. COMPARISON:  Head and cervical spine CT scan 04/24/2020. FINDINGS: CT HEAD FINDINGS Brain: No evidence of acute infarction, hemorrhage, hydrocephalus, extra-axial collection or mass lesion/mass effect. 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 Vascular: No hyperdense vessel or unexpected calcification. Skull: No acute abnormality.  Remote right craniotomy again seen. Other: None. CT MAXILLOFACIAL FINDINGS Osseous: No fracture or mandibular dislocation. No destructive process. Orbits: Negative. No traumatic or inflammatory finding. Sinuses: Clear. Soft tissues: Negative. CT CERVICAL SPINE  FINDINGS Alignment: Maintained. Skull base and vertebrae: The patient has an acute fracture of the anterior aspect of the left lateral mass C2. There is slight anterior displacement of the fracture fragment and minimal impaction. No other fracture is identified. No lytic or sclerotic lesion. Soft tissues and spinal canal: No prevertebral fluid or swelling. No visible canal hematoma. Disc levels: Loss of disc space height and endplate spurring most notable at C5-6 and C6-7. Scattered facet degenerative change is worst on the left at C4-5. Upper chest: Lung apices are clear with centrilobular emphysema noted. Other: None. IMPRESSION: Mildly impacted and anteriorly displaced fracture of the left lateral mass of C2. No other acute abnormality head, face or cervical spine. Postoperative change of right craniotomy with underlying encephalomalacia, unchanged. 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 Emphysema (ICD10-J43.9). Critical Value/emergent results were called by telephone at the time of interpretation on 08/02/2020 at 1:49 pm to provider Sherwood Gambler , who verbally acknowledged these results. Electronically Signed   By: Inge Rise M.D.   On: 08/02/2020 13:51    Scheduled Meds: . Cenobamate  100 mg Oral Daily  . cholecalciferol  1,000 Units Oral Daily  . escitalopram  10 mg Oral Daily  . gabapentin  200 mg Oral QHS  . lacosamide  200 mg Oral BID  . multivitamin with minerals  1 tablet Oral Daily  . phenytoin  100 mg Oral Daily   And  . phenytoin  200 mg Oral QHS  . potassium chloride  40 mEq Oral BID  . senna-docusate  2 tablet Oral QHS  . vitamin B-12  1,000 mcg Oral Daily   Continuous Infusions: . sodium chloride 125 mL/hr at 08/03/20 0027  . amiodarone 60 mg/hr (08/03/20 1300)   Followed by  . amiodarone    . meropenem (MERREM) IV 1 g (08/03/20 0909)     LOS: 1 day   Marylu Lund, MD [facility] Pager On Amion  If 7PM-7AM, please contact  night-coverage 08/03/2020, 3:08 PM

## 2020-08-03 NOTE — Progress Notes (Signed)
   08/03/20 0350  Provider Notification  Provider Name/Title Opyd  Date Provider Notified 08/03/20  Time Provider Notified [phone]  Notification Type Page  Notification Reason Other (Comment) (Patient refusing second IV run of Potassium. patient states that burns too bad)  Response No new orders

## 2020-08-03 NOTE — Progress Notes (Signed)
Inpatient Rehab Admissions Coordinator Note:   Per therapy recommendations, pt was screened for CIR candidacy by Estill Dooms, PT, DPT.  At this time we are recommending CIR consult and I will place an order per our protocol.  Please contact me with questions.   Estill Dooms, PT, DPT [phone] 08/03/20 2:13 PM

## 2020-08-03 NOTE — Evaluation (Signed)
Physical Therapy Evaluation Patient Details Name: [name] MRN: [id number] DOB: 09-05-1955 Today's Date: 08/03/2020   History of Present Illness  64 y.o. female with fall resulting in C2 fracture and has left hand/finger fracture with splint in place.  Pt with new onset atrial flutter with rapid ventricular response as well as has UTI.  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  Clinical Impression  Pt admitted with above diagnosis. Pt was able to sit EOB for 5 minutes with min guard to min assist.  Pt stood at side of bed with mod assist to power up with poor balance with posterior lean.  Need to clarify left UE weight bearing as well. Tried to get orthostatic blood pressure but difficult as pt could not tolerate standing long enough. Also suspect pt could have a vestibular issue but could not assess today due to pt got on phone at end of session and pt did not want to stop her conversation.Pt will possibly need Rehab prior to d/c home given her multiple injuries.  Will follow acutely.  Pt currently with functional limitations due to the deficits listed below (see PT Problem List). Pt will benefit from skilled PT to increase their independence and safety with mobility to allow discharge to the venue listed below.    Orthostatic BPs  Supine 103/74, 141 bpm  Sitting 100/80, 146 bpm  Standing Would not register  Standing after 3 min Could not tolerate standing 3 min   BP once back in bed was 99/78.  Pt does report dizziness with position changes.   Follow Up Recommendations CIR;Supervision/Assistance - 24 hour    Equipment Recommendations  3in1 (PT)    Recommendations for Other Services       Precautions / Restrictions  Precautions Precautions: Fall;Cervical Required Braces or Orthoses: Cervical Brace;Splint/Cast Cervical Brace: Hard collar;At all times Splint/Cast: left UE (No weight bearing restrictions in chart, assumed NWB left UE until clarified) Restrictions Weight Bearing Restrictions: Yes LUE Weight Bearing: Non weight bearing Other Position/Activity Restrictions: Need clarification      Mobility  Bed Mobility Overal bed mobility: Needs Assistance Bed Mobility: Rolling Rolling: Mod assist Sidelying to sit: Mod assist       General bed mobility comments: Mod assist for LES off bed and for elevation of trunk with incr time and pt shaky needing more assist.    Transfers Overall transfer level: Needs assistance Equipment used: 2 person hand held assist Transfers: Sit to/from Stand Sit to Stand: Mod assist;From elevated surface         General transfer comment: Pt had difficulty standing up needing mod assist for power up with posterior lean losing balance and needed to sit abruptly due to imbalance. Pt BP would not take while pt in standing.  Difficult to obtain orthostatic BP in standing as pt could not maintain standing long enough to get blood pressure.  Ambulation/Gait             General Gait Details: Unable this date  Stairs            Wheelchair Mobility    Modified Rankin (Stroke Patients Only)       Balance Overall balance assessment: Needs assistance Sitting-balance support: No upper extremity supported;Feet supported Sitting balance-Leahy Scale: Poor Sitting balance - Comments: needed at least right  UE support for balance   Standing balance support: Bilateral upper extremity supported;During functional activity Standing balance-Leahy Scale: Poor Standing balance comment: Pt was able to stand with +2 assist by PT with handheld assist and knees and feet blocked.  Pt could not tolerate standing more than 30 seconds and had posterior lean and was  unsteady  on feet.                             Pertinent Vitals/Pain Pain Assessment: Faces Faces Pain Scale: Hurts even more Pain Location: ribs, left UE and neck Pain Descriptors / Indicators: Aching;Grimacing;Guarding Pain Intervention(s): Limited activity within patient's tolerance;Monitored during session;Repositioned    Home Living Family/patient expects to be discharged to:: Private residence Living Arrangements: Children;Other relatives Available Help at Discharge: Family;Available 24 hours/day Type of Home: House Home Access: Stairs to enter Entrance Stairs-Rails: Right Entrance Stairs-Number of Steps: 7 Home Layout: Two level Home Equipment: Environmental consultant - 2 wheels Additional Comments: Pt reports she lives with her daughter and her daughter's family    Prior Function Level of Independence: Independent         Comments: has RW on both levels     Hand Dominance   Dominant Hand: Right    Extremity/Trunk Assessment   Upper Extremity Assessment Upper Extremity Assessment: RUE deficits/detail RUE Deficits / Details: casted to left hand and wrist RUE Sensation: WNL    Lower Extremity Assessment Lower Extremity Assessment: Generalized weakness    Cervical / Trunk Assessment Cervical / Trunk Assessment: Kyphotic  Communication   Communication: No difficulties  Cognition Arousal/Alertness: Awake/alert Behavior During Therapy: WFL for tasks assessed/performed Overall Cognitive Status: History of cognitive impairments - at baseline                                        General Comments General comments (skin integrity, edema, etc.): HR 141 bpm on arrival, 103/74 supine.  Sitting BP 100/80.  Standing BP would not measure due to pt could not stand long enough.  Once pt laid back in bed BP was 99/78. Pt c/o dizziness with each transition.  Did not note any signs of vertigo although PT slightly suspicious. Was going to check for vestibular issues  however pt got a phone call and would not get off phone for PT to do assessment.    Exercises General Exercises - Lower Extremity Long Arc Quad: AROM;Both;5 reps;Seated   Assessment/Plan    PT Assessment Patient needs continued PT services  PT Problem List Decreased activity tolerance;Decreased balance;Decreased mobility;Decreased knowledge of use of DME;Decreased safety awareness;Decreased knowledge of precautions;Pain;Cardiopulmonary status limiting activity       PT Treatment Interventions DME instruction;Gait training;Functional mobility training;Therapeutic activities;Therapeutic exercise;Stair training;Balance training;Patient/family education;Cognitive remediation    PT Goals (Current goals can be found in the Care Plan section)  Acute Rehab PT Goals Patient Stated Goal: Be comfortable moving around again. PT Goal Formulation: With patient Time For Goal Achievement: 08/17/20 Potential to Achieve Goals: Good    Frequency Min 3X/week   Barriers to discharge        Co-evaluation               AM-PAC PT "6 Clicks" Mobility  Outcome Measure Help needed turning from your back to your side while in a flat bed without using bedrails?: A Lot Help needed moving from lying  on your back to sitting on the side of a flat bed without using bedrails?: A Lot Help needed moving to and from a bed to a chair (including a wheelchair)?: A Lot Help needed standing up from a chair using your arms (e.g., wheelchair or bedside chair)?: A Lot Help needed to walk in hospital room?: Total Help needed climbing 3-5 steps with a railing? : Total 6 Click Score: 10    End of Session Equipment Utilized During Treatment: Gait belt Activity Tolerance: Patient limited by fatigue;Patient limited by pain (limited by dizziness) Patient left: in bed;with call bell/phone within reach;with bed alarm set Nurse Communication: Mobility status PT Visit Diagnosis: Unsteadiness on feet (R26.81);Muscle  weakness (generalized) (M62.81);Dizziness and giddiness (R42);Pain Pain - Right/Left: Left Pain - part of body: Arm (ribs per pt, neck)    Time: [id number] PT Time Calculation (min) (ACUTE ONLY): 33 min   Charges:   PT Evaluation $PT Eval Moderate Complexity: 1 Mod PT Treatments $Therapeutic Activity: 8-22 mins        Kentley Cedillo W,PT Acute Rehabilitation Services Pager:  [phone]  Office:  [phone]    Denice Paradise 08/03/2020, 1:46 PM

## 2020-08-03 NOTE — Progress Notes (Signed)
  Echocardiogram 2D Echocardiogram has been performed.  [name] 08/03/2020, 3:01 PM

## 2020-08-03 NOTE — Progress Notes (Signed)
Progress Note  Patient Name: [name] Date of Encounter: 08/03/2020  [facility] Cardiologist: No primary care provider on file.  [place]  Subjective   In bed, back pain.  No chest pain, no significant shortness of breath.  Does not feel palpitations or heart racing.  Inpatient Medications    Scheduled Meds: . Cenobamate  100 mg Oral Daily  . cholecalciferol  1,000 Units Oral Daily  . escitalopram  10 mg Oral Daily  . gabapentin  200 mg Oral QHS  . lacosamide  200 mg Oral BID  . multivitamin with minerals  1 tablet Oral Daily  . phenytoin  100 mg Oral Daily   And  . phenytoin  200 mg Oral QHS  . potassium chloride  40 mEq Oral BID  . senna-docusate  2 tablet Oral QHS  . vitamin B-12  1,000 mcg Oral Daily   Continuous Infusions: . sodium chloride 125 mL/hr at 08/03/20 0027  . diltiazem (CARDIZEM) infusion Stopped (08/02/20 1928)  . magnesium sulfate bolus IVPB 2 g (08/03/20 0857)  . meropenem (MERREM) IV 1 g (08/03/20 0909)   PRN Meds: acetaminophen, albuterol, HYDROmorphone (DILAUDID) injection, oxyCODONE   Vital Signs    Vitals:   08/02/20 2300 08/03/20 0000 08/03/20 0400 08/03/20 0829  BP: 90/61 104/63 100/66 (!) 84/65  Pulse: (!) 140 (!) 140 (!) 149 (!) 147  Resp: (!) 29 (!) 28 (!) 26 (!) 24  Temp: 98.4 F (36.9 C) 98.3 F (36.8 C) 98.6 F (37 C) 98 F (36.7 C)  TempSrc: Oral Oral Oral Oral  SpO2: 92% 93% 100% 100%  Weight:      Height:        Intake/Output Summary (Last 24 hours) at 08/03/2020 0919 Last data filed at 08/03/2020 0600 Gross per 24 hour  Intake 2076.47 ml  Output 650 ml  Net 1426.47 ml   Last 3 Weights 08/02/2020 06/06/2020 04/24/2020  Weight (lbs) 138 lb 14.2 oz 138 lb 130 lb  Weight (kg) 63 kg 62.596 kg 58.968 kg      Telemetry    Atrial flutter heart rate in the upper 140s- Personally Reviewed  ECG    Atrial flutter- Personally Reviewed  Physical Exam   GEN: No acute distress.   Neck: No JVD, neck collar in  place Cardiac:  Tachycardic regular, no murmurs, rubs, or gallops.  Respiratory: Clear to auscultation bilaterally. GI: Soft, nontender, non-distended  MS: No edema; No deformity. Neuro:  Nonfocal  Psych: Normal affect   Labs    High Sensitivity Troponin:   Recent Labs  Lab 08/02/20 1137 08/02/20 1140 08/02/20 1935  TROPONINIHS 25* 24* 27*      Chemistry Recent Labs  Lab 08/02/20 1137 08/02/20 1205 08/03/20 0001  NA 138 139 140  K 3.2* 3.2* 2.7*  CL 100 100 109  CO2 26  --  21*  GLUCOSE 146* 140* 112*  BUN 14 14 8   CREATININE 1.02* 0.90 0.74  CALCIUM 8.7*  --  7.3*  PROT 6.6  --  4.8*  ALBUMIN 2.8*  --  2.1*  AST 25  --  23  ALT 21  --  16  ALKPHOS 54  --  46  BILITOT 0.3  --  0.5  GFRNONAA >60  --  >60  ANIONGAP 12  --  10     Hematology Recent Labs  Lab 08/02/20 1137 08/02/20 1205 08/03/20 0001  WBC 8.3  --  6.3  RBC 3.83*  --  3.20*  HGB 13.3 13.3 10.8*  HCT 38.9 39.0 32.8*  MCV 101.6*  --  102.5*  MCH 34.7*  --  33.8  MCHC 34.2  --  32.9  RDW 11.9  --  11.9  PLT 156  --  110*    BNPNo results for input(s): BNP, PROBNP in the last 168 hours.   DDimer No results for input(s): DDIMER in the last 168 hours.   Radiology    CT HEAD WO CONTRAST  Result Date: 08/02/2020 CLINICAL DATA:  Status post fall on stairs today. Neck pain. Initial encounter. EXAM: CT HEAD WITHOUT CONTRAST CT MAXILLOFACIAL WITHOUT CONTRAST CT CERVICAL SPINE WITHOUT CONTRAST TECHNIQUE: Multidetector CT imaging of the head, cervical spine, and maxillofacial structures were performed using the standard protocol without intravenous contrast. Multiplanar CT image reconstructions of the cervical spine and maxillofacial structures were also generated. COMPARISON:  Head and cervical spine CT scan 04/24/2020. FINDINGS: CT HEAD FINDINGS Brain: No evidence of acute infarction, hemorrhage, hydrocephalus, extra-axial collection or mass lesion/mass effect. 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 Vascular: No hyperdense vessel or unexpected calcification. Skull: No acute abnormality.  Remote right craniotomy again seen. Other: None. CT MAXILLOFACIAL FINDINGS Osseous: No fracture or mandibular dislocation. No destructive process. Orbits: Negative. No traumatic or inflammatory finding. Sinuses: Clear. Soft tissues: Negative. CT CERVICAL SPINE FINDINGS Alignment: Maintained. Skull base and vertebrae: The patient has an acute fracture of the anterior aspect of the left lateral mass C2. There is slight anterior displacement of the fracture fragment and minimal impaction. No other fracture is identified. No lytic or sclerotic lesion. Soft tissues and spinal canal: No prevertebral fluid or swelling. No visible canal hematoma. Disc levels: Loss of disc space height and endplate spurring most notable at C5-6 and C6-7. Scattered facet degenerative change is worst on the left at C4-5. Upper chest: Lung apices are clear with centrilobular emphysema noted. Other: None. IMPRESSION: Mildly impacted and anteriorly displaced fracture of the left lateral mass of C2. No other acute abnormality head, face or cervical spine. Postoperative change of right craniotomy with underlying encephalomalacia, unchanged. 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 Emphysema (ICD10-J43.9). Critical Value/emergent results were called by telephone at the time of interpretation on 08/02/2020 at 1:49 pm to provider Sherwood Gambler , who verbally acknowledged these results. Electronically Signed   By: Inge Rise M.D.   On: 08/02/2020 13:51   CT CERVICAL SPINE WO CONTRAST  Result Date: 08/02/2020 CLINICAL DATA:  Status post fall on stairs today. Neck pain. Initial encounter. EXAM: CT HEAD WITHOUT CONTRAST CT MAXILLOFACIAL WITHOUT CONTRAST CT CERVICAL SPINE WITHOUT CONTRAST TECHNIQUE: Multidetector CT imaging of the head, cervical spine, and maxillofacial  structures were performed using the standard protocol without intravenous contrast. Multiplanar CT image reconstructions of the cervical spine and maxillofacial structures were also generated. COMPARISON:  Head and cervical spine CT scan 04/24/2020. FINDINGS: CT HEAD FINDINGS Brain: No evidence of acute infarction, hemorrhage, hydrocephalus, extra-axial collection or mass lesion/mass effect. 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 Vascular: No hyperdense vessel or unexpected calcification. Skull: No acute abnormality.  Remote right craniotomy again seen. Other: None. CT MAXILLOFACIAL FINDINGS Osseous: No fracture or mandibular dislocation. No destructive process. Orbits: Negative. No traumatic or inflammatory finding. Sinuses: Clear. Soft tissues: Negative. CT CERVICAL SPINE FINDINGS Alignment: Maintained. Skull base and vertebrae: The patient has an acute fracture of the anterior aspect of the left lateral mass C2. There is slight anterior displacement of  the fracture fragment and minimal impaction. No other fracture is identified. No lytic or sclerotic lesion. Soft tissues and spinal canal: No prevertebral fluid or swelling. No visible canal hematoma. Disc levels: Loss of disc space height and endplate spurring most notable at C5-6 and C6-7. Scattered facet degenerative change is worst on the left at C4-5. Upper chest: Lung apices are clear with centrilobular emphysema noted. Other: None. IMPRESSION: Mildly impacted and anteriorly displaced fracture of the left lateral mass of C2. No other acute abnormality head, face or cervical spine. Postoperative change of right craniotomy with underlying encephalomalacia, unchanged. 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 Emphysema (ICD10-J43.9). Critical Value/emergent results were called by telephone at the time of interpretation on 08/02/2020 at 1:49 pm to provider Pricilla Loveless , who verbally  acknowledged these results. Electronically Signed   By: Drusilla Kanner M.D.   On: 08/02/2020 13:51   CT CHEST ABDOMEN PELVIS W CONTRAST  Result Date: 08/02/2020 CLINICAL DATA:  64 year old female with a history of fall EXAM: CT CHEST, ABDOMEN, AND PELVIS WITH CONTRAST TECHNIQUE: Multidetector CT imaging of the chest, abdomen and pelvis was performed following the standard protocol during bolus administration of intravenous contrast. CONTRAST:  OMNIPAQUE IOHEXOL 300 MG/ML  SOLN COMPARISON:  Chest CT 08/26/2019, FINDINGS: CT CHEST FINDINGS Cardiovascular: Heart size within normal limits. Mild pericardial fluid/thickening, relatively unchanged from the comparison CT. Calcifications of the aortic valve. No significant coronary calcifications. Unremarkable course caliber and contour of the thoracic aorta. Mild atherosclerotic changes. Branch vessels are patent. Mild atherosclerosis of the thoracic aorta. No filling defects within the proximal pulmonary arteries. Mediastinum/Nodes: Unremarkable thoracic inlet. Unremarkable thoracic esophagus. Small lymph nodes of the mediastinum. Lungs/Pleura: Paraseptal and centrilobular emphysema. No endotracheal/endobronchial debris. No pneumothorax or pleural effusion. No confluent airspace disease. Atelectatic changes at the left lung base. Musculoskeletal: Degenerative changes of the lower cervical and the thoracic spine. No bony canal narrowing. No acute fracture line identified. No acute displaced rib fracture. CT ABDOMEN PELVIS FINDINGS Hepatobiliary: Unremarkable liver.  Unremarkable gallbladder. Pancreas: Unremarkable Spleen: Unremarkable Adrenals/Urinary Tract: - Right adrenal gland:  Unremarkable - Left adrenal gland: Unremarkable. - Right kidney: No hydronephrosis, nephrolithiasis, inflammation, or ureteral dilation. No focal lesion. - Left Kidney: No hydronephrosis. No nephrolithiasis. There is a wedge-shaped region of heterogeneous enhancement and loss of the  corticomedullary junction in the posterior left kidney involving both cortex and the medullary tissue. This is most apparent on the delayed renal imaging. There is a second region more anteriorly decreased attenuation involving the corticomedullary region. No adenopathy. Circumferential enhancement of the urothelium in the left renal pelvis is present, asymmetric from the right. No nephrolithiasis. No ureteral dilation. - Urinary Bladder: Urinary bladder relatively decompressed. Stomach/Bowel: - Stomach: Hiatal hernia.  Otherwise unremarkable stomach. - Small bowel: Unremarkable - Appendix: Normal. - Colon: Unremarkable. Vascular/Lymphatic: Atherosclerotic changes of the lower thoracic aorta and the abdominal aorta. No aneurysm or dissection. Atherosclerotic changes of the bilateral iliac arteries which remain patent. Proximal profunda femoris and SFA patent bilaterally. Unremarkable appearance of the IVC. There is compression of the left common iliac vein in the typical location below the right common iliac artery in a May-Thurner configuration. Associated small caliber left common iliac vein and external iliac vein. Collateral venous drainage involves the anterior pelvic region from the left great saphenous to the right great saphenous via the pudendal/epigastric vasculature. Engorged left gonadal vein. Reproductive: Unremarkable uterus and adnexa. Other: None Musculoskeletal: Negative for acute fracture or malalignment of the spine. Degenerative changes of  the lumbar region. No bony canal narrowing. Minimal degenerative changes of the hips. IMPRESSION: No acute traumatic finding of the chest, abdomen, pelvis CT. There is a wedge shaped lesion within the posterior left kidney, involving both the cortical and medullary tissue with an additional smaller focus of the anterior left kidney. Differential diagnosis for this appearance is pyelonephritis (which is favored) as well as renal malignancy such as renal lymphoma  or urothelial carcinoma. Correlation with urinalysis and/or urine cytology may be useful, as well as short-term interval follow-up contrast-enhanced abdominal CT after therapy. Aortic Atherosclerosis (ICD10-I70.0) and Emphysema (ICD10-J43.9). 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 Correlation with any left-sided chronic venous symptoms may be useful. Electronically Signed   By: Corrie Mckusick D.O.   On: 08/02/2020 13:50   DG Chest Portable 1 View  Result Date: 08/02/2020 CLINICAL DATA:  Chest pain following a fall down stairs. EXAM: PORTABLE CHEST 1 VIEW COMPARISON:  04/02/2020 FINDINGS: Normal sized heart. Mildly tortuous and calcified thoracic aorta. Clear lungs. Old, healed right clavicle fracture. Stable old, healed right 4th lateral rib fracture. Stable mild to moderate thoracolumbar scoliosis. No acute fracture or pneumothorax seen. IMPRESSION: No acute abnormality. Electronically Signed   By: Claudie Revering M.D.   On: 08/02/2020 12:01   DG Hand Complete Left  Result Date: 08/02/2020 CLINICAL DATA:  Left middle finger pain following a fall down stairs. EXAM: LEFT HAND - COMPLETE 3+ VIEW COMPARISON:  03/12/2006 FINDINGS: Acute fracture at the ulnar aspect of the base of the 2nd proximal phalanx extending into the 2nd MCP joint. Mild displacement of the corner fragment. There is also dorsal and proximal dislocation of the 3rd middle phalanx relative to the proximal phalanx without associated visible fracture. IMPRESSION: 1. Fracture of the base of the 2nd proximal phalanx with associated intra-articular extension. 2. Dislocation at the 3rd PIP joint. Electronically Signed   By: Claudie Revering M.D.   On: 08/02/2020 12:04   DG Finger Middle Left  Result Date: 08/02/2020 CLINICAL DATA:  Status post reduction of a left 3rd PIP joint dislocation. EXAM: LEFT MIDDLE FINGER 2+V COMPARISON:  None. FINDINGS: The previously demonstrated 3rd PIP joint dislocation has been reduced. There are tiny  avulsion fracture fragments ventral and dorsal to the joint. There is associated soft tissue swelling. The previously noted fracture of the base of the 2nd proximal phalanx is unchanged. IMPRESSION: 1. Status post reduction of the previously demonstrated 3rd PIP joint dislocation with tiny avulsion fracture fragments ventral and dorsal to the joint. 2. Stable fracture of the base of the 2nd proximal phalanx with intra-articular extension. Electronically Signed   By: Claudie Revering M.D.   On: 08/02/2020 12:46   CT Maxillofacial Wo Contrast  Result Date: 08/02/2020 CLINICAL DATA:  Status post fall on stairs today. Neck pain. Initial encounter. EXAM: CT HEAD WITHOUT CONTRAST CT MAXILLOFACIAL WITHOUT CONTRAST CT CERVICAL SPINE WITHOUT CONTRAST TECHNIQUE: Multidetector CT imaging of the head, cervical spine, and maxillofacial structures were performed using the standard protocol without intravenous contrast. Multiplanar CT image reconstructions of the cervical spine and maxillofacial structures were also generated. COMPARISON:  Head and cervical spine CT scan 04/24/2020. FINDINGS: CT HEAD FINDINGS Brain: No evidence of acute infarction, hemorrhage, hydrocephalus, extra-axial collection or mass lesion/mass effect. 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 Vascular: No hyperdense vessel or unexpected calcification. Skull: No acute abnormality.  Remote right craniotomy again seen. Other: None. CT MAXILLOFACIAL FINDINGS Osseous: No fracture or mandibular dislocation. No destructive  process. Orbits: Negative. No traumatic or inflammatory finding. Sinuses: Clear. Soft tissues: Negative. CT CERVICAL SPINE FINDINGS Alignment: Maintained. Skull base and vertebrae: The patient has an acute fracture of the anterior aspect of the left lateral mass C2. There is slight anterior displacement of the fracture fragment and minimal impaction. No other fracture is identified.  No lytic or sclerotic lesion. Soft tissues and spinal canal: No prevertebral fluid or swelling. No visible canal hematoma. Disc levels: Loss of disc space height and endplate spurring most notable at C5-6 and C6-7. Scattered facet degenerative change is worst on the left at C4-5. Upper chest: Lung apices are clear with centrilobular emphysema noted. Other: None. IMPRESSION: Mildly impacted and anteriorly displaced fracture of the left lateral mass of C2. No other acute abnormality head, face or cervical spine. Postoperative change of right craniotomy with underlying encephalomalacia, unchanged. 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 Emphysema (ICD10-J43.9). Critical Value/emergent results were called by telephone at the time of interpretation on 08/02/2020 at 1:49 pm to provider Sherwood Gambler , who verbally acknowledged these results. Electronically Signed   By: Inge Rise M.D.   On: 08/02/2020 13:51    Cardiac Studies   Echocardiogram pending  Patient Profile     64 y.o. 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  Assessment & Plan    Atrial flutter with rapid ventricular response -Heart rate most recently 147 bpm with blood pressure of 84/65. -Given lack of blood pressure, inability to utilize diltiazem or metoprolol which had to be stopped, we will give her IV amiodarone to help improve overall rate. -Agree, not a good long-term anticoagulation candidate. -Echocardiogram pending.  Hypokalemia -Replete per primary team.  2.7 this morning.  C2 fracture -Per neurosurgery.  Fell downstairs.  History of Covid pneumonia in September 2021. -Negative Covid test during this hospitalization.  Strong family history of coronary disease -Mother had 4 MIs first in her 20s.  Father died suddenly at age 83 with MI.  Demand ischemia -Flat low level  high-sensitivity troponin of 25 and 24.      For questions or updates, please contact [facility] Please consult [email or web address] for contact info under        Signed, Candee Furbish, MD  08/03/2020, 9:19 AM

## 2020-08-03 NOTE — Progress Notes (Signed)
   08/03/20 0102  Provider Notification  Provider Name/Title [facility]  Date Provider Notified 08/03/20  Time Provider Notified 0102  Notification Type Page  Notification Reason Other (Comment) (Critical Potassium 2.7)  Response See new orders  Date of Provider Response 08/03/20  Time of Provider Response [phone]

## 2020-08-03 NOTE — Evaluation (Addendum)
Occupational Therapy Evaluation Patient Details Name: [name] MRN: [id number] DOB: 27-Jun-1956 Today's Date: 08/03/2020    History of Present Illness 64 y.o. 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   Clinical Impression   Patient admitted for the above diagnosis.  Deficits impacting independence in the acute setting are listed below.  Please see prior level for status PTA.  OT found patient on bedpan with call bell out of reach.  OT assisted with rolling, peri care and bed positioning.  OT held patient for out of bed attempt due to resting HR at the time, see below.  OT reviewed log rolling, L upper extremity positioning for edema control, and cervical precautions - sheet to be given.  OT will follow in the acute setting, SNF to be determined based on progress.  Of note, some of the details about home setup given were different than described to PT.      Follow Up Recommendations  CIR    Equipment Recommendations  Tub/shower seat;3 in 1 bedside commode    Recommendations for Other Services       Precautions / Restrictions Precautions Precautions: Fall;Cervical Required Braces or Orthoses: Cervical Brace Cervical Brace: Hard collar;At all times Restrictions Weight Bearing Restrictions: No      Mobility Bed Mobility Overal bed mobility: Needs Assistance Bed Mobility: Rolling Rolling: Mod assist Sidelying to sit: Mod assist       General bed mobility comments: Mod assist for LES off bed and for elevation of trunk with incr time and pt shaky needing more assist.    Transfers Overall transfer  level: Needs assistance Equipment used: 2 person hand held assist Transfers: Sit to/from Stand Sit to Stand: Mod assist;From elevated surface         General transfer comment: not attempted patient's resting HR 141-153 throughout.    Balance Overall balance assessment: Needs assistance                                         ADL either performed or assessed with clinical judgement   ADL Overall ADL's : Needs assistance/impaired Eating/Feeding: Set up;Bed level   Grooming: Wash/dry face;Wash/dry hands;Set up;Sitting   Upper Body Bathing: Moderate assistance;Bed level   Lower Body Bathing: Maximal assistance;Bed level   Upper Body Dressing : Bed level;Moderate assistance   Lower Body Dressing: Maximal assistance;Bed level       Toileting- Clothing Manipulation and Hygiene: Maximal assistance;Bed level               Vision Baseline Vision/History: Wears glasses Wears Glasses: At all times Patient Visual Report: No change from baseline       Perception     Praxis      Pertinent Vitals/Pain Pain Assessment: Faces Faces Pain Scale: Hurts even more Pain Location: ribs Pain Descriptors / Indicators: Aching;Grimacing;Guarding Pain Intervention(s): Monitored during session     Hand Dominance Right   Extremity/Trunk Assessment Upper Extremity Assessment Upper Extremity Assessment: LUE deficits/detail RUE Deficits / Details: casted to L hand and wrist RUE Sensation: WNL   Lower  Extremity Assessment Lower Extremity Assessment: Defer to PT evaluation   Cervical / Trunk Assessment Cervical / Trunk Assessment: Kyphotic   Communication Communication Communication: No difficulties   Cognition Arousal/Alertness: Awake/alert Behavior During Therapy: WFL for tasks assessed/performed Overall Cognitive Status: History of cognitive impairments - at baseline                                     General Comments      Exercises      Shoulder Instructions      Home Living Family/patient expects to be discharged to:: Private residence Living Arrangements: Children;Other relatives Available Help at Discharge: Family;Available 24 hours/day Type of Home: House Home Access: Stairs to enter [employer] of Steps: 7 Entrance Stairs-Rails: Right Home Layout: Two level Alternate Level Stairs-Number of Steps: 15 Alternate Level Stairs-Rails: Right Bathroom Shower/Tub: Occupational psychologist: Standard Bathroom Accessibility: Yes How Accessible: Accessible via walker Home Equipment: Walker - 2 wheels   Additional Comments: Pt reports she lives with her daughter and her daughter's family      Prior Functioning/Environment Level of Independence: Independent        Comments: has RW on both levels        OT Problem List: Decreased range of motion;Decreased activity tolerance;Impaired balance (sitting and/or standing);Impaired UE functional use;Pain      OT Treatment/Interventions: Self-care/ADL training;Therapeutic exercise;DME and/or AE instruction;Balance training;Therapeutic activities    OT Goals(Current goals can be found in the care plan section) Acute Rehab OT Goals Patient Stated Goal: Be comfortable moving around again. OT Goal Formulation: With patient Time For Goal Achievement: 08/17/20 ADL Goals Pt Will Perform Grooming: with supervision;sitting;standing Pt Will Perform Upper Body Bathing: with supervision;sitting Pt Will Perform Lower Body Bathing: with supervision;sit to/from stand Pt Will Perform Upper Body Dressing: with supervision;sitting Pt Will Perform Lower Body Dressing: with supervision;sit to/from stand Pt Will Transfer to Toilet: with supervision;ambulating Pt Will Perform Toileting - Clothing Manipulation and hygiene: with supervision;sit to/from stand  OT Frequency: Min 2X/week   Barriers to D/C:    none noted       Co-evaluation               AM-PAC OT "6 Clicks" Daily Activity     Outcome Measure Help from another person eating meals?: A Little Help from another person taking care of personal grooming?: A Little Help from another person toileting, which includes using toliet, bedpan, or urinal?: A Lot Help from another person bathing (including washing, rinsing, drying)?: A Lot Help from another person to put on and taking off regular upper body clothing?: A Lot Help from another person to put on and taking off regular lower body clothing?: A Lot 6 Click Score: 14   End of Session Equipment Utilized During Treatment: Cervical collar Nurse Communication: Mobility status  Activity Tolerance: Patient limited by pain Patient left: in bed;with call bell/phone within reach;with bed alarm set;with nursing/sitter in room  OT Visit Diagnosis: Pain Pain - Right/Left: Left Pain - part of body: Arm                Time: 1228-1300 OT Time Calculation (min): 32 min Charges:  OT General Charges $OT Visit: 1 Visit OT Evaluation $OT Eval Moderate Complexity: 1 Mod OT Treatments $Self Care/Home Management : 8-22 mins  08/03/2020  Rich, OTR/L  Acute Rehabilitation Services  Office:  [phone]   Delfino Lovett  D Fedrick Cefalu 08/03/2020, 1:10 PM

## 2020-08-04 DIAGNOSIS — S63259A Unspecified dislocation of unspecified finger, initial encounter: Secondary | ICD-10-CM | POA: Diagnosis not present

## 2020-08-04 DIAGNOSIS — S12100A Unspecified displaced fracture of second cervical vertebra, initial encounter for closed fracture: Secondary | ICD-10-CM | POA: Diagnosis not present

## 2020-08-04 DIAGNOSIS — W19XXXA Unspecified fall, initial encounter: Secondary | ICD-10-CM | POA: Diagnosis not present

## 2020-08-04 DIAGNOSIS — I4892 Unspecified atrial flutter: Secondary | ICD-10-CM | POA: Diagnosis not present

## 2020-08-04 LAB — BASIC METABOLIC PANEL
Anion gap: 10 (ref 5–15)
BUN: <5 mg/dL — ABNORMAL LOW (ref 8–23)
CO2: 23 mmol/L (ref 22–32)
Calcium: 7.7 mg/dL — ABNORMAL LOW (ref 8.9–10.3)
Chloride: 107 mmol/L (ref 98–111)
Creatinine, Ser: 0.57 mg/dL (ref 0.44–1.00)
GFR, Estimated: >60 mL/min (ref >60)
Glucose, Bld: 114 mg/dL — ABNORMAL HIGH (ref 70–99)
Potassium: 3.6 mmol/L (ref 3.5–5.1)
Sodium: 140 mmol/L (ref 135–145)

## 2020-08-04 LAB — CBC WITH DIFFERENTIAL/PLATELET
Abs Immature Granulocytes: 0.02 10*3/uL (ref 0.00–0.07)
Basophils Absolute: 0 10*3/uL (ref 0.0–0.1)
Basophils Relative: 0 %
Eosinophils Absolute: 0 10*3/uL (ref 0.0–0.5)
Eosinophils Relative: 1 %
HCT: 33.9 % — ABNORMAL LOW (ref 36.0–46.0)
Hemoglobin: 10.9 g/dL — ABNORMAL LOW (ref 12.0–15.0)
Immature Granulocytes: 0 %
Lymphocytes Relative: 16 %
Lymphs Abs: 0.8 10*3/uL (ref 0.7–4.0)
MCH: 33.1 pg (ref 26.0–34.0)
MCHC: 32.2 g/dL (ref 30.0–36.0)
MCV: 103 fL — ABNORMAL HIGH (ref 80.0–100.0)
Monocytes Absolute: 0.4 10*3/uL (ref 0.1–1.0)
Monocytes Relative: 9 %
Neutro Abs: 3.5 10*3/uL (ref 1.7–7.7)
Neutrophils Relative %: 74 %
Platelets: 134 10*3/uL — ABNORMAL LOW (ref 150–400)
RBC: 3.29 MIL/uL — ABNORMAL LOW (ref 3.87–5.11)
RDW: 12 % (ref 11.5–15.5)
WBC: 4.8 10*3/uL (ref 4.0–10.5)
nRBC: 0 % (ref 0.0–0.2)

## 2020-08-04 LAB — URINE CULTURE: Culture: >=100000 — AB

## 2020-08-04 LAB — MAGNESIUM: Magnesium: 1.8 mg/dL (ref 1.7–2.4)

## 2020-08-04 MED ORDER — LOPERAMIDE HCL 2 MG PO CAPS
2.0000 mg | ORAL_CAPSULE | ORAL | Status: DC | PRN
  Administered 2020-08-04: 2 mg via ORAL
  Filled 2020-08-04: qty 1

## 2020-08-04 MED ORDER — SODIUM CHLORIDE 0.9 % IV SOLN: 1.0000 g | INTRAVENOUS | Status: DC

## 2020-08-04 MED ORDER — CEFTRIAXONE SODIUM 1 G IJ SOLR
1.0000 g | INTRAMUSCULAR | Status: AC
  Administered 2020-08-04 – 2020-08-05 (×2): 1 g via INTRAVENOUS
  Filled 2020-08-04 (×2): qty 1

## 2020-08-04 MED ORDER — METOPROLOL TARTRATE 12.5 MG HALF TABLET
12.5000 mg | ORAL_TABLET | Freq: Two times a day (BID) | ORAL | Status: DC
  Administered 2020-08-04 – 2020-08-06 (×5): 12.5 mg via ORAL
  Filled 2020-08-04 (×5): qty 1

## 2020-08-04 MED ORDER — AMIODARONE LOAD VIA INFUSION
150.0000 mg | Freq: Once | INTRAVENOUS | Status: AC
  Administered 2020-08-04: 150 mg via INTRAVENOUS
  Filled 2020-08-04: qty 83.34

## 2020-08-04 NOTE — Progress Notes (Signed)
Patient removed ACE bandage/Splint from right wrist.  Stated that does not want it on anymore.  RN educated patient concerning importance ACE Bandage/Spint.  Patient advised that she still does not want it on her arm anymore.  RN will continue to monitor patient

## 2020-08-04 NOTE — Progress Notes (Signed)
Pt's HR went ups to 140s. Pt asymptomatic. PA aware. amio drip bolus x1 received. Will continue to monitior the pt.  Lawson Radar, RN

## 2020-08-04 NOTE — Progress Notes (Addendum)
Education about getting up gave to pt this morning. However, Pt attempted to get out of bed without call for help & almost fall. Stating that" I walked like this at home" . Pt alertx0x4. Reeducation about calling for help gave to pt. Pt verbalized understanding. Bed alarm on . Call bell within reach.  Lawson Radar, RN

## 2020-08-04 NOTE — Progress Notes (Signed)
PROGRESS NOTE    [name]  [id number] DOB: 02/03/1956 DOA: 08/02/2020 PCP: [name], MD    Brief Narrative:  65 y.o. 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  Associated with immediate left hand pain, neck pain and occipital headache.  History is obtained from the patient however it is limited due to pain from her fractures.  She does not know whether or not she tripped and fell but she thinks she passed out.  Not postictal.  She is alert and oriented x3.  Reports prior to this event she was having dysuria for a few days.  Denies any fevers or chills.  No nausea or abdominal pain.  No prior cardiopulmonary symptoms.  She had another fall about a week ago and does not remember the circumstances around the fall.  Denies any prior history of A. fib.  Work-up in the ED revealed new onset A. fib with RVR, urine analysis positive for pyuria, C2 lateral mass fracture, left hand/finger fracture, possible left pyelonephritis on CT scan.  She was seen by neurosurgery, recommended conservative management, to wear a c-collar at all times.  She now has a left upper extremity splint in place.  Cardiology was consulted to assist with new onset A. fib with RVR.  TRH was asked to admit for further work-up and management of present condition.  Assessment & Plan:   Active Problems:   Fall   Fall, unclear etiology -On further questioning this AM, pt reports remembering events of falling and states "missing a step" up a staircase. -2d echo performed, findings of normal LVEF -Therapy recs for CIR when stable -Per therapy, there are concerns for possible vestibular issues  resulting in loss of balance. PT to follow up  New onset rapid aflutter No prior history of A. Fib Remains tachycardic Was continued on Cardizem drip, however was later d/c secondary to hypotension 2d echo per above Cardiology following. Currently on amiodarone IV. Metoprolol added per Cardiology Recheck bmet in AM  Elevated troponin likely demand ischemia in the setting of A. fib with RVR Troponin peaked at 25 and trended down Suspect demand ischemia related to rapid heart rate Follow 2D echo Cardiology continues to follow  Pan-sensitive ecoli UTI with suspected pyelonephritis on CT scan History of ESBL E. coli in 2019 Presented with urine analysis positive for pyuria Obtain urine culture pos for pan-sensitive ecoli Initially started on meropenem, will narrow to rocephin No leukocytosis  C2 left lateral mass fracture post fall Seen by neurosurgery Recommended treatment with aspen c-collar at all times  Left hand/finger fracture post fall She currently has a splint in place Continue with analgesia with bowel regimen  Prolonged QTC Presented with QTC of 545 Avoid QTC prolonging agents Continue to keep K>4 and Mg>2  Seizure disorder Subtherapeutic level of Dilantin, 2.9 Not postictal at the time of this visit Resume home regimen, she is on several seizure medications Pharmacy assisting with dosage Seizure precautions. Thus far seizure free  Polyneuropathy Resume home Neurontin  Chronic anxiety/depression Stable She is on escitalopram  Hypokalemia Presented with serum potassium 3.2 Magnesium 2.1 Recheck lytes in AM  Hyperglycemia likely stress-induced in the setting of fractures Hgb A1c 5.0 Monitor for now  Prior tobacco abuse history -Reported long hx of prior tobacco abuse -Pt elected to quit smoking 4 years ago and remains tobacco free -Pt congratulated  DVT prophylaxis: SCD's Code Status: Full Family Communication: Pt in room, family not  at bedside  Status is: Inpatient  Remains inpatient appropriate because:Hemodynamically unstable, IV treatments appropriate due to intensity of illness or inability to take PO and Inpatient level of care appropriate due to severity of illness  Dispo:  Patient From: Home  Planned Disposition: Home with Health Care Svc  Expected discharge date: 08/06/2020  Medically stable for discharge: No   Consultants:   Cardiology  Procedures:     Antimicrobials: Anti-infectives (From admission, onward)   Start     Dose/Rate Route Frequency Ordered Stop   08/04/20 1700  cefTRIAXone (ROCEPHIN) 1 g in sodium chloride 0.9 % 100 mL IVPB  Status:  Discontinued       Note to Pharmacy: Pan-sensitive UTI on urine cx   1 g 200 mL/hr over 30 Minutes Intravenous Every 24 hours 08/04/20 1600 08/04/20 1601   08/04/20 1700  cefTRIAXone (ROCEPHIN) 1 g in sodium chloride 0.9 % 100 mL IVPB       Note to Pharmacy: Pan-sensitive UTI on urine cx   1 g 200 mL/hr over 30 Minutes Intravenous Every 24 hours 08/04/20 1601 08/06/20 1659   08/03/20 1500  cefTRIAXone (ROCEPHIN) 2 g in sodium chloride 0.9 % 100 mL IVPB  Status:  Discontinued        2 g 200 mL/hr over 30 Minutes Intravenous Every 24 hours 08/02/20 1547 08/02/20 1557   08/02/20 1730  meropenem (MERREM) 1 g in sodium chloride 0.9 % 100 mL IVPB  Status:  Discontinued        1 g 200 mL/hr over 30 Minutes Intravenous Every 8 hours 08/02/20 1641 08/04/20 1600   08/02/20 1500  cefTRIAXone (ROCEPHIN) 1 g in sodium chloride 0.9 % 100 mL IVPB        1 g 200 mL/hr over 30 Minutes Intravenous  Once 08/02/20 1458 08/02/20 1628      Subjective: Reports feeling weaker than baseline. Feeling hungry  Objective: Vitals:   08/04/20 0100 08/04/20 0400 08/04/20 0807 08/04/20 1218  BP: 98/75 101/65 111/74 103/63  Pulse: (!) 132 (!) 125 (!) 121 (!) 109  Resp: (!) 23 (!) 24 20 20   Temp: 98.7 F (37.1 C) 98.5 F (36.9 C) 98.6 F (37 C) 98.3 F (36.8 C)  TempSrc:  Oral Oral Oral Oral  SpO2: 99% 96% 96% 99%  Weight:      Height:        Intake/Output Summary (Last 24 hours) at 08/04/2020 1625 Last data filed at 08/04/2020 [id number] Gross per 24 hour  Intake 2375.44 ml  Output -  Net 2375.44 ml   Filed Weights   08/02/20 1600  Weight: 63 kg    Examination: General exam: Conversant, in no acute distress Respiratory system: normal chest rise, clear, no audible wheezing Cardiovascular system: regular rhythm, s1-s2 Gastrointestinal system: Nondistended, nontender, pos BS Central nervous system: No seizures, no tremors Extremities: No cyanosis, no joint deformities Skin: No rashes, no pallor Psychiatry: Affect normal // no auditory hallucinations   Data Reviewed: I have personally reviewed following labs and imaging studies  CBC: Recent Labs  Lab 08/02/20 1137 08/02/20 1205 08/03/20 0001 08/04/20 0703  WBC 8.3  --  6.3 4.8  NEUTROABS 6.7  --   --  3.5  HGB 13.3 13.3 10.8* 10.9*  HCT  38.9 39.0 32.8* 33.9*  MCV 101.6*  --  102.5* 103.0*  PLT 156  --  110* 134*   Basic Metabolic Panel: Recent Labs  Lab 08/02/20 1137 08/02/20 1140 08/02/20 1205 08/03/20 0001 08/04/20 0703  NA 138  --  139 140 140  K 3.2*  --  3.2* 2.7* 3.6  CL 100  --  100 109 107  CO2 26  --   --  21* 23  GLUCOSE 146*  --  140* 112* 114*  BUN 14  --  14 8 <5*  CREATININE 1.02*  --  0.90 0.74 0.57  CALCIUM 8.7*  --   --  7.3* 7.7*  MG  --  2.1  --  1.6* 1.8   GFR: Estimated Creatinine Clearance: 70.9 mL/min (by C-G formula based on SCr of 0.57 mg/dL). Liver Function Tests: Recent Labs  Lab 08/02/20 1137 08/03/20 0001  AST 25 23  ALT 21 16  ALKPHOS 54 46  BILITOT 0.3 0.5  PROT 6.6 4.8*  ALBUMIN 2.8* 2.1*   No results for input(s): LIPASE, AMYLASE in the last 168 hours. No results for input(s): AMMONIA in the last 168 hours. Coagulation Profile: No results for input(s): INR, PROTIME in the last 168 hours. Cardiac Enzymes: No results for input(s):  CKTOTAL, CKMB, CKMBINDEX, TROPONINI in the last 168 hours. BNP (last 3 results) No results for input(s): PROBNP in the last 8760 hours. HbA1C: Recent Labs    08/02/20 1137  HGBA1C 5.0   CBG: No results for input(s): GLUCAP in the last 168 hours. Lipid Profile: No results for input(s): CHOL, HDL, LDLCALC, TRIG, CHOLHDL, LDLDIRECT in the last 72 hours. Thyroid Function Tests: Recent Labs    08/02/20 1137  TSH 1.332   Anemia Panel: No results for input(s): VITAMINB12, FOLATE, FERRITIN, TIBC, IRON, RETICCTPCT in the last 72 hours. Sepsis Labs: No results for input(s): PROCALCITON, LATICACIDVEN in the last 168 hours.  Recent Results (from the past 240 hour(s))  Resp Panel by RT-PCR (Flu A&B, Covid) Nasopharyngeal Swab     Status: None   Collection Time: 08/02/20 11:56 AM   Specimen: Nasopharyngeal Swab; Nasopharyngeal(NP) swabs in vial transport medium  Result Value Ref Range Status   SARS Coronavirus 2 by RT PCR NEGATIVE NEGATIVE Final    Comment: (NOTE) SARS-CoV-2 target nucleic acids are NOT DETECTED.  The SARS-CoV-2 RNA is generally detectable in upper respiratory specimens during the acute phase of infection. The lowest concentration of SARS-CoV-2 viral copies this assay can detect is 138 copies/mL. A negative result does not preclude SARS-Cov-2 infection and should not be used as the sole basis for treatment or other patient management decisions. A negative result may occur with  improper specimen collection/handling, submission of specimen other than nasopharyngeal swab, presence of viral mutation(s) within the areas targeted by this assay, and inadequate number of viral copies(<138 copies/mL). A negative result must be combined with clinical observations, patient history, and epidemiological information. The expected result is Negative.  Fact Sheet for Patients:  BloggerCourse.com  Fact Sheet for Healthcare Providers:   SeriousBroker.it  This test is no t yet approved or cleared by the [place] FDA and  has been authorized for detection and/or diagnosis of SARS-CoV-2 by FDA under an Emergency Use Authorization (EUA). This EUA will remain  in effect (meaning this test can be used) for the duration of the COVID-19 declaration under Section 564(b)(1) of the Act, 21 U.S.C.section 360bbb-3(b)(1), unless the authorization is terminated  or revoked sooner.  Influenza A by PCR NEGATIVE NEGATIVE Final   Influenza B by PCR NEGATIVE NEGATIVE Final    Comment: (NOTE) The Xpert Xpress SARS-CoV-2/FLU/RSV plus assay is intended as an aid in the diagnosis of influenza from Nasopharyngeal swab specimens and should not be used as a sole basis for treatment. Nasal washings and aspirates are unacceptable for Xpert Xpress SARS-CoV-2/FLU/RSV testing.  Fact Sheet for Patients: EntrepreneurPulse.com.au  Fact Sheet for Healthcare Providers: IncredibleEmployment.be  This test is not yet approved or cleared by the [place] FDA and has been authorized for detection and/or diagnosis of SARS-CoV-2 by FDA under an Emergency Use Authorization (EUA). This EUA will remain in effect (meaning this test can be used) for the duration of the COVID-19 declaration under Section 564(b)(1) of the Act, 21 U.S.C. section 360bbb-3(b)(1), unless the authorization is terminated or revoked.  Performed at [facility], [place] [address], [place], [place] [address]   Urine culture     Status: Abnormal   Collection Time: 08/02/20  2:00 PM   Specimen: Urine, Random  Result Value Ref Range Status   Specimen Description URINE, RANDOM  Final   Special Requests   Final    NONE Performed at [facility], [place] [address] [place], [place] [address]    Culture >=100,000 COLONIES/mL ESCHERICHIA COLI (A)  Final   Report Status 08/04/2020 FINAL  Final   Organism  ID, Bacteria ESCHERICHIA COLI (A)  Final      Susceptibility   Escherichia coli - MIC*    AMPICILLIN <=2 SENSITIVE Sensitive     CEFAZOLIN <=4 SENSITIVE Sensitive     CEFEPIME <=0.12 SENSITIVE Sensitive     CEFTRIAXONE <=0.25 SENSITIVE Sensitive     CIPROFLOXACIN <=0.25 SENSITIVE Sensitive     GENTAMICIN <=1 SENSITIVE Sensitive     IMIPENEM <=0.25 SENSITIVE Sensitive     NITROFURANTOIN <=16 SENSITIVE Sensitive     TRIMETH/SULFA <=20 SENSITIVE Sensitive     AMPICILLIN/SULBACTAM <=2 SENSITIVE Sensitive     PIP/TAZO <=4 SENSITIVE Sensitive     * >=100,000 COLONIES/mL ESCHERICHIA COLI  Culture, blood (routine x 2)     Status: None (Preliminary result)   Collection Time: 08/02/20 11:20 PM   Specimen: BLOOD RIGHT HAND  Result Value Ref Range Status   Specimen Description BLOOD RIGHT HAND  Final   Special Requests   Final    BOTTLES DRAWN AEROBIC AND ANAEROBIC Blood Culture adequate volume   Culture   Final    NO GROWTH 1 DAY Performed at [facility], [address], [place], [place] [address]    Report Status PENDING  Incomplete  Culture, blood (routine x 2)     Status: None (Preliminary result)   Collection Time: 08/02/20 11:35 PM   Specimen: BLOOD  Result Value Ref Range Status   Specimen Description BLOOD RT THUMB  Final   Special Requests AEROBIC BOTTLE ONLY Blood Culture adequate volume  Final   Culture   Final    NO GROWTH 1 DAY Performed at [facility], [place] [address] [place], [place] [address]    Report Status PENDING  Incomplete     Radiology Studies: ECHOCARDIOGRAM COMPLETE  Result Date: 08/03/2020    ECHOCARDIOGRAM REPORT   Patient Name:   [name] Date of Exam: 08/03/2020 Medical Rec #:  [id number]      Height:       67.0 in Accession #:    [id number]     Weight:  138.9 lb Date of Birth:  1956/03/24      BSA:          1.732 m Patient Age:    68 years       BP:           92/56 mmHg Patient Gender: F              HR:           127 bpm. Exam  Location:  Inpatient Procedure: 2D Echo, Cardiac Doppler and Color Doppler Indications:    R55 Syncope; I48.91* Unspeicified atrial fibrillation  History:        Patient has no prior history of Echocardiogram examinations.                 Abnormal ECG, COPD and Stroke, Arrythmias:Atrial Flutter and                 Atrial Fibrillation, Signs/Symptoms:Fever and Chest Pain; Risk                 Factors:Current Smoker. Hypoxia. History of covid infection.  Sonographer:    Roseanna Rainbow RDCS Referring Phys: [id number] [place]  Sonographer Comments: Technically difficult study due to poor echo windows. Cervical brace. IMPRESSIONS  1. Left ventricular ejection fraction, by estimation, is 55 to 60%. The left ventricle has normal function. The left ventricle has no regional wall motion abnormalities. Left ventricular diastolic parameters are indeterminate.  2. Right ventricular systolic function is normal. The right ventricular size is normal. There is mildly elevated pulmonary artery systolic pressure. The estimated right ventricular systolic pressure is [id number] mmHg.  3. The mitral valve is normal in structure. No evidence of mitral valve regurgitation. No evidence of mitral stenosis.  4. The aortic valve is tricuspid. Aortic valve regurgitation is not visualized. No aortic stenosis is present.  5. The inferior vena cava is dilated in size with <50% respiratory variability, suggesting right atrial pressure of 15 mmHg. FINDINGS  Left Ventricle: Left ventricular ejection fraction, by estimation, is 55 to 60%. The left ventricle has normal function. The left ventricle has no regional wall motion abnormalities. The left ventricular internal cavity size was normal in size. There is  no left ventricular hypertrophy. Left ventricular diastolic parameters are indeterminate. Right Ventricle: The right ventricular size is normal. No increase in right ventricular wall thickness. Right ventricular systolic function is normal. There is  mildly elevated pulmonary artery systolic pressure. The tricuspid regurgitant velocity is 2.44  m/s, and with an assumed right atrial pressure of 15 mmHg, the estimated right ventricular systolic pressure is [id number] mmHg. Left Atrium: Left atrial size was normal in size. Right Atrium: Right atrial size was normal in size. Pericardium: Trivial pericardial effusion is present. Mitral Valve: The mitral valve is normal in structure. No evidence of mitral valve regurgitation. No evidence of mitral valve stenosis. Tricuspid Valve: The tricuspid valve is normal in structure. Tricuspid valve regurgitation is trivial. Aortic Valve: The aortic valve is tricuspid. Aortic valve regurgitation is not visualized. No aortic stenosis is present. Pulmonic Valve: The pulmonic valve was normal in structure. Pulmonic valve regurgitation is not visualized. Aorta: The aortic root is normal in size and structure. Venous: The inferior vena cava is dilated in size with less than 50% respiratory variability, suggesting right atrial pressure of 15 mmHg. IAS/Shunts: No atrial level shunt detected by color flow Doppler.  LEFT VENTRICLE PLAX 2D LVIDd:         4.20 cm LVIDs:  3.30 cm LV PW:         1.30 cm LV IVS:        0.90 cm LVOT diam:     1.80 cm LV SV:         36 LV SV Index:   21 LVOT Area:     2.54 cm  LV Volumes (MOD) LV vol d, MOD A2C: 43.3 ml LV vol d, MOD A4C: 40.3 ml LV vol s, MOD A2C: 15.2 ml LV vol s, MOD A4C: 18.4 ml LV SV MOD A2C:     28.1 ml LV SV MOD A4C:     40.3 ml LV SV MOD BP:      26.6 ml RIGHT VENTRICLE             IVC RV S prime:     14.30 cm/s  IVC diam: 2.50 cm TAPSE (M-mode): 2.1 cm LEFT ATRIUM           Index       RIGHT ATRIUM           Index LA diam:      3.40 cm 1.96 cm/m  RA Area:     10.70 cm LA Vol (A2C): 15.7 ml 9.07 ml/m  RA Volume:   23.30 ml  13.45 ml/m LA Vol (A4C): 22.0 ml 12.70 ml/m  AORTIC VALVE LVOT Vmax:   111.00 cm/s LVOT Vmean:  72.000 cm/s LVOT VTI:    0.143 m  AORTA Ao Root diam: 2.70 cm  Ao Asc diam:  2.70 cm MITRAL VALVE               TRICUSPID VALVE MV Area (PHT): 3.46 cm    TR Peak grad:   23.8 mmHg MV Decel Time: 219 msec    TR Vmax:        244.00 cm/s MV E velocity: 71.57 cm/s                            SHUNTS                            Systemic VTI:  0.14 m                            Systemic Diam: 1.80 cm Loralie Champagne MD Electronically signed by Loralie Champagne MD Signature Date/Time: 08/03/2020/4:33:28 PM    Final     Scheduled Meds: . Cenobamate  100 mg Oral Daily  . cholecalciferol  1,000 Units Oral Daily  . escitalopram  10 mg Oral Daily  . gabapentin  200 mg Oral QHS  . lacosamide  200 mg Oral BID  . metoprolol tartrate  12.5 mg Oral BID  . multivitamin with minerals  1 tablet Oral Daily  . phenytoin  100 mg Oral Daily   And  . phenytoin  200 mg Oral QHS  . senna-docusate  2 tablet Oral QHS  . sodium chloride flush  10-40 mL Intracatheter Q12H  . vitamin B-12  1,000 mcg Oral Daily   Continuous Infusions: . sodium chloride 125 mL/hr at 08/04/20 0640  . amiodarone 30 mg/hr (08/04/20 1622)  . cefTRIAXone (ROCEPHIN)  IV       LOS: 2 days   Marylu Lund, MD [facility] Pager On Amion  If 7PM-7AM, please contact night-coverage 08/04/2020, 4:25 PM

## 2020-08-04 NOTE — Progress Notes (Signed)
Progress Note  Patient Name: [name] Date of Encounter: 08/04/2020  [facility] Cardiologist: Dr Rennis Golden  Subjective   No CP or dyspnea  Inpatient Medications    Scheduled Meds: . Cenobamate  100 mg Oral Daily  . cholecalciferol  1,000 Units Oral Daily  . escitalopram  10 mg Oral Daily  . gabapentin  200 mg Oral QHS  . lacosamide  200 mg Oral BID  . multivitamin with minerals  1 tablet Oral Daily  . phenytoin  100 mg Oral Daily   And  . phenytoin  200 mg Oral QHS  . senna-docusate  2 tablet Oral QHS  . sodium chloride flush  10-40 mL Intracatheter Q12H  . vitamin B-12  1,000 mcg Oral Daily   Continuous Infusions: . sodium chloride 125 mL/hr at 08/04/20 0640  . amiodarone 30 mg/hr (08/04/20 0552)  . meropenem (MERREM) IV 1 g (08/04/20 0253)   PRN Meds: acetaminophen, albuterol, HYDROmorphone (DILAUDID) injection, oxyCODONE, sodium chloride flush   Vital Signs    Vitals:   08/03/20 2004 08/04/20 0100 08/04/20 0400 08/04/20 0807  BP: 94/62 98/75 101/65 111/74  Pulse: (!) 146 (!) 132 (!) 125 (!) 121  Resp: (!) 22 (!) 23 (!) 24 20  Temp: 98.4 F (36.9 C) 98.7 F (37.1 C) 98.5 F (36.9 C) 98.6 F (37 C)  TempSrc: Oral Oral Oral Oral  SpO2: 93% 99% 96% 96%  Weight:      Height:        Intake/Output Summary (Last 24 hours) at 08/04/2020 0818 Last data filed at 08/04/2020 0600 Gross per 24 hour  Intake 2375.44 ml  Output -  Net 2375.44 ml   Last 3 Weights 08/02/2020 06/06/2020 04/24/2020  Weight (lbs) 138 lb 14.2 oz 138 lb 130 lb  Weight (kg) 63 kg 62.596 kg 58.968 kg      Telemetry    Atrial flutter with elevated HR - Personally Reviewed  Physical Exam   GEN: No acute distress.  C collar Neck: Difficult to assess Cardiac: irregular and tachycardic Respiratory: CTA anteriorly GI: Soft, nontender, non-distended, no masses MS: No edema Neuro: Grossly intact Psych: Normal affect   Labs    High Sensitivity Troponin:   Recent Labs  Lab  08/02/20 1137 08/02/20 1140 08/02/20 1935  TROPONINIHS 25* 24* 27*      Chemistry Recent Labs  Lab 08/02/20 1137 08/02/20 1205 08/03/20 0001  NA 138 139 140  K 3.2* 3.2* 2.7*  CL 100 100 109  CO2 26  --  21*  GLUCOSE 146* 140* 112*  BUN 14 14 8   CREATININE 1.02* 0.90 0.74  CALCIUM 8.7*  --  7.3*  PROT 6.6  --  4.8*  ALBUMIN 2.8*  --  2.1*  AST 25  --  23  ALT 21  --  16  ALKPHOS 54  --  46  BILITOT 0.3  --  0.5  GFRNONAA >60  --  >60  ANIONGAP 12  --  10     Hematology Recent Labs  Lab 08/02/20 1137 08/02/20 1205 08/03/20 0001 08/04/20 0703  WBC 8.3  --  6.3 4.8  RBC 3.83*  --  3.20* 3.29*  HGB 13.3 13.3 10.8* 10.9*  HCT 38.9 39.0 32.8* 33.9*  MCV 101.6*  --  102.5* 103.0*  MCH 34.7*  --  33.8 33.1  MCHC 34.2  --  32.9 32.2  RDW 11.9  --  11.9 12.0  PLT 156  --  110* 134*  Radiology    CT HEAD WO CONTRAST  Result Date: 08/02/2020 CLINICAL DATA:  Status post fall on stairs today. Neck pain. Initial encounter. EXAM: CT HEAD WITHOUT CONTRAST CT MAXILLOFACIAL WITHOUT CONTRAST CT CERVICAL SPINE WITHOUT CONTRAST TECHNIQUE: Multidetector CT imaging of the head, cervical spine, and maxillofacial structures were performed using the standard protocol without intravenous contrast. Multiplanar CT image reconstructions of the cervical spine and maxillofacial structures were also generated. COMPARISON:  Head and cervical spine CT scan 04/24/2020. FINDINGS: CT HEAD FINDINGS Brain: No evidence of acute infarction, hemorrhage, hydrocephalus, extra-axial collection or mass lesion/mass effect. 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 Vascular: No hyperdense vessel or unexpected calcification. Skull: No acute abnormality.  Remote right craniotomy again seen. Other: None. CT MAXILLOFACIAL FINDINGS Osseous: No fracture or mandibular dislocation. No destructive process. Orbits: Negative. No traumatic or inflammatory  finding. Sinuses: Clear. Soft tissues: Negative. CT CERVICAL SPINE FINDINGS Alignment: Maintained. Skull base and vertebrae: The patient has an acute fracture of the anterior aspect of the left lateral mass C2. There is slight anterior displacement of the fracture fragment and minimal impaction. No other fracture is identified. No lytic or sclerotic lesion. Soft tissues and spinal canal: No prevertebral fluid or swelling. No visible canal hematoma. Disc levels: Loss of disc space height and endplate spurring most notable at C5-6 and C6-7. Scattered facet degenerative change is worst on the left at C4-5. Upper chest: Lung apices are clear with centrilobular emphysema noted. Other: None. IMPRESSION: Mildly impacted and anteriorly displaced fracture of the left lateral mass of C2. No other acute abnormality head, face or cervical spine. Postoperative change of right craniotomy with underlying encephalomalacia, unchanged. 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 Emphysema (ICD10-J43.9). Critical Value/emergent results were called by telephone at the time of interpretation on 08/02/2020 at 1:49 pm to provider Pricilla LovelessSCOTT GOLDSTON , who verbally acknowledged these results. Electronically Signed   By: Drusilla Kannerhomas  Dalessio M.D.   On: 08/02/2020 13:51   CT CERVICAL SPINE WO CONTRAST  Result Date: 08/02/2020 CLINICAL DATA:  Status post fall on stairs today. Neck pain. Initial encounter. EXAM: CT HEAD WITHOUT CONTRAST CT MAXILLOFACIAL WITHOUT CONTRAST CT CERVICAL SPINE WITHOUT CONTRAST TECHNIQUE: Multidetector CT imaging of the head, cervical spine, and maxillofacial structures were performed using the standard protocol without intravenous contrast. Multiplanar CT image reconstructions of the cervical spine and maxillofacial structures were also generated. COMPARISON:  Head and cervical spine CT scan 04/24/2020. FINDINGS: CT HEAD FINDINGS Brain: No evidence of acute infarction, hemorrhage, hydrocephalus, extra-axial collection  or mass lesion/mass effect. 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 Vascular: No hyperdense vessel or unexpected calcification. Skull: No acute abnormality.  Remote right craniotomy again seen. Other: None. CT MAXILLOFACIAL FINDINGS Osseous: No fracture or mandibular dislocation. No destructive process. Orbits: Negative. No traumatic or inflammatory finding. Sinuses: Clear. Soft tissues: Negative. CT CERVICAL SPINE FINDINGS Alignment: Maintained. Skull base and vertebrae: The patient has an acute fracture of the anterior aspect of the left lateral mass C2. There is slight anterior displacement of the fracture fragment and minimal impaction. No other fracture is identified. No lytic or sclerotic lesion. Soft tissues and spinal canal: No prevertebral fluid or swelling. No visible canal hematoma. Disc levels: Loss of disc space height and endplate spurring most notable at C5-6 and C6-7. Scattered facet degenerative change is worst on the left at C4-5. Upper chest: Lung apices are clear with centrilobular emphysema noted. Other: None. IMPRESSION: Mildly impacted and  anteriorly displaced fracture of the left lateral mass of C2. No other acute abnormality head, face or cervical spine. Postoperative change of right craniotomy with underlying encephalomalacia, unchanged. 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 Emphysema (ICD10-J43.9). Critical Value/emergent results were called by telephone at the time of interpretation on 08/02/2020 at 1:49 pm to provider Sherwood Gambler , who verbally acknowledged these results. Electronically Signed   By: Inge Rise M.D.   On: 08/02/2020 13:51   CT CHEST ABDOMEN PELVIS W CONTRAST  Result Date: 08/02/2020 CLINICAL DATA:  65 year old female with a history of fall EXAM: CT CHEST, ABDOMEN, AND PELVIS WITH CONTRAST TECHNIQUE: Multidetector CT imaging of the chest, abdomen and pelvis was performed following the  standard protocol during bolus administration of intravenous contrast. CONTRAST:  143mL OMNIPAQUE IOHEXOL 300 MG/ML  SOLN COMPARISON:  Chest CT 08/26/2019, FINDINGS: CT CHEST FINDINGS Cardiovascular: Heart size within normal limits. Mild pericardial fluid/thickening, relatively unchanged from the comparison CT. Calcifications of the aortic valve. No significant coronary calcifications. Unremarkable course caliber and contour of the thoracic aorta. Mild atherosclerotic changes. Branch vessels are patent. Mild atherosclerosis of the thoracic aorta. No filling defects within the proximal pulmonary arteries. Mediastinum/Nodes: Unremarkable thoracic inlet. Unremarkable thoracic esophagus. Small lymph nodes of the mediastinum. Lungs/Pleura: Paraseptal and centrilobular emphysema. No endotracheal/endobronchial debris. No pneumothorax or pleural effusion. No confluent airspace disease. Atelectatic changes at the left lung base. Musculoskeletal: Degenerative changes of the lower cervical and the thoracic spine. No bony canal narrowing. No acute fracture line identified. No acute displaced rib fracture. CT ABDOMEN PELVIS FINDINGS Hepatobiliary: Unremarkable liver.  Unremarkable gallbladder. Pancreas: Unremarkable Spleen: Unremarkable Adrenals/Urinary Tract: - Right adrenal gland:  Unremarkable - Left adrenal gland: Unremarkable. - Right kidney: No hydronephrosis, nephrolithiasis, inflammation, or ureteral dilation. No focal lesion. - Left Kidney: No hydronephrosis. No nephrolithiasis. There is a wedge-shaped region of heterogeneous enhancement and loss of the corticomedullary junction in the posterior left kidney involving both cortex and the medullary tissue. This is most apparent on the delayed renal imaging. There is a second region more anteriorly decreased attenuation involving the corticomedullary region. No adenopathy. Circumferential enhancement of the urothelium in the left renal pelvis is present, asymmetric from  the right. No nephrolithiasis. No ureteral dilation. - Urinary Bladder: Urinary bladder relatively decompressed. Stomach/Bowel: - Stomach: Hiatal hernia.  Otherwise unremarkable stomach. - Small bowel: Unremarkable - Appendix: Normal. - Colon: Unremarkable. Vascular/Lymphatic: Atherosclerotic changes of the lower thoracic aorta and the abdominal aorta. No aneurysm or dissection. Atherosclerotic changes of the bilateral iliac arteries which remain patent. Proximal profunda femoris and SFA patent bilaterally. Unremarkable appearance of the IVC. There is compression of the left common iliac vein in the typical location below the right common iliac artery in a May-Thurner configuration. Associated small caliber left common iliac vein and external iliac vein. Collateral venous drainage involves the anterior pelvic region from the left great saphenous to the right great saphenous via the pudendal/epigastric vasculature. Engorged left gonadal vein. Reproductive: Unremarkable uterus and adnexa. Other: None Musculoskeletal: Negative for acute fracture or malalignment of the spine. Degenerative changes of the lumbar region. No bony canal narrowing. Minimal degenerative changes of the hips. IMPRESSION: No acute traumatic finding of the chest, abdomen, pelvis CT. There is a wedge shaped lesion within the posterior left kidney, involving both the cortical and medullary tissue with an additional smaller focus of the anterior left kidney. Differential diagnosis for this appearance is pyelonephritis (which is favored) as well as renal malignancy such as renal lymphoma or urothelial  carcinoma. Correlation with urinalysis and/or urine cytology may be useful, as well as short-term interval follow-up contrast-enhanced abdominal CT after therapy. Aortic Atherosclerosis (ICD10-I70.0) and Emphysema (ICD10-J43.9). 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 Correlation with any left-sided chronic venous symptoms may be useful.  Electronically Signed   By: Corrie Mckusick D.O.   On: 08/02/2020 13:50   DG Chest Portable 1 View  Result Date: 08/02/2020 CLINICAL DATA:  Chest pain following a fall down stairs. EXAM: PORTABLE CHEST 1 VIEW COMPARISON:  04/02/2020 FINDINGS: Normal sized heart. Mildly tortuous and calcified thoracic aorta. Clear lungs. Old, healed right clavicle fracture. Stable old, healed right 4th lateral rib fracture. Stable mild to moderate thoracolumbar scoliosis. No acute fracture or pneumothorax seen. IMPRESSION: No acute abnormality. Electronically Signed   By: Claudie Revering M.D.   On: 08/02/2020 12:01   DG Hand Complete Left  Result Date: 08/02/2020 CLINICAL DATA:  Left middle finger pain following a fall down stairs. EXAM: LEFT HAND - COMPLETE 3+ VIEW COMPARISON:  03/12/2006 FINDINGS: Acute fracture at the ulnar aspect of the base of the 2nd proximal phalanx extending into the 2nd MCP joint. Mild displacement of the corner fragment. There is also dorsal and proximal dislocation of the 3rd middle phalanx relative to the proximal phalanx without associated visible fracture. IMPRESSION: 1. Fracture of the base of the 2nd proximal phalanx with associated intra-articular extension. 2. Dislocation at the 3rd PIP joint. Electronically Signed   By: Claudie Revering M.D.   On: 08/02/2020 12:04   DG Finger Middle Left  Result Date: 08/02/2020 CLINICAL DATA:  Status post reduction of a left 3rd PIP joint dislocation. EXAM: LEFT MIDDLE FINGER 2+V COMPARISON:  None. FINDINGS: The previously demonstrated 3rd PIP joint dislocation has been reduced. There are tiny avulsion fracture fragments ventral and dorsal to the joint. There is associated soft tissue swelling. The previously noted fracture of the base of the 2nd proximal phalanx is unchanged. IMPRESSION: 1. Status post reduction of the previously demonstrated 3rd PIP joint dislocation with tiny avulsion fracture fragments ventral and dorsal to the joint. 2. Stable fracture  of the base of the 2nd proximal phalanx with intra-articular extension. Electronically Signed   By: Claudie Revering M.D.   On: 08/02/2020 12:46   ECHOCARDIOGRAM COMPLETE  Result Date: 08/03/2020    ECHOCARDIOGRAM REPORT   Patient Name:   [name] Date of Exam: 08/03/2020 Medical Rec #:  [id number]      Height:       67.0 in Accession #:    [id number]     Weight:       138.9 lb Date of Birth:  [birth year]      BSA:          1.732 m Patient Age:    65 years       BP:           92/56 mmHg Patient Gender: F              HR:           127 bpm. Exam Location:  Inpatient Procedure: 2D Echo, Cardiac Doppler and Color Doppler Indications:    R55 Syncope; I48.91* Unspeicified atrial fibrillation  History:        Patient has no prior history of Echocardiogram examinations.                 Abnormal ECG, COPD and Stroke, Arrythmias:Atrial Flutter and  Atrial Fibrillation, Signs/Symptoms:Fever and Chest Pain; Risk                 Factors:Current Smoker. Hypoxia. History of covid infection.  Sonographer:    Roseanna Rainbow RDCS Referring Phys: [id number] [place]  Sonographer Comments: Technically difficult study due to poor echo windows. Cervical brace. IMPRESSIONS  1. Left ventricular ejection fraction, by estimation, is 55 to 60%. The left ventricle has normal function. The left ventricle has no regional wall motion abnormalities. Left ventricular diastolic parameters are indeterminate.  2. Right ventricular systolic function is normal. The right ventricular size is normal. There is mildly elevated pulmonary artery systolic pressure. The estimated right ventricular systolic pressure is [id number] mmHg.  3. The mitral valve is normal in structure. No evidence of mitral valve regurgitation. No evidence of mitral stenosis.  4. The aortic valve is tricuspid. Aortic valve regurgitation is not visualized. No aortic stenosis is present.  5. The inferior vena cava is dilated in size with <50% respiratory variability,  suggesting right atrial pressure of 15 mmHg. FINDINGS  Left Ventricle: Left ventricular ejection fraction, by estimation, is 55 to 60%. The left ventricle has normal function. The left ventricle has no regional wall motion abnormalities. The left ventricular internal cavity size was normal in size. There is  no left ventricular hypertrophy. Left ventricular diastolic parameters are indeterminate. Right Ventricle: The right ventricular size is normal. No increase in right ventricular wall thickness. Right ventricular systolic function is normal. There is mildly elevated pulmonary artery systolic pressure. The tricuspid regurgitant velocity is 2.44  m/s, and with an assumed right atrial pressure of 15 mmHg, the estimated right ventricular systolic pressure is [id number] mmHg. Left Atrium: Left atrial size was normal in size. Right Atrium: Right atrial size was normal in size. Pericardium: Trivial pericardial effusion is present. Mitral Valve: The mitral valve is normal in structure. No evidence of mitral valve regurgitation. No evidence of mitral valve stenosis. Tricuspid Valve: The tricuspid valve is normal in structure. Tricuspid valve regurgitation is trivial. Aortic Valve: The aortic valve is tricuspid. Aortic valve regurgitation is not visualized. No aortic stenosis is present. Pulmonic Valve: The pulmonic valve was normal in structure. Pulmonic valve regurgitation is not visualized. Aorta: The aortic root is normal in size and structure. Venous: The inferior vena cava is dilated in size with less than 50% respiratory variability, suggesting right atrial pressure of 15 mmHg. IAS/Shunts: No atrial level shunt detected by color flow Doppler.  LEFT VENTRICLE PLAX 2D LVIDd:         4.20 cm LVIDs:         3.30 cm LV PW:         1.30 cm LV IVS:        0.90 cm LVOT diam:     1.80 cm LV SV:         36 LV SV Index:   21 LVOT Area:     2.54 cm  LV Volumes (MOD) LV vol d, MOD A2C: 43.3 ml LV vol d, MOD A4C: 40.3 ml LV vol s, MOD  A2C: 15.2 ml LV vol s, MOD A4C: 18.4 ml LV SV MOD A2C:     28.1 ml LV SV MOD A4C:     40.3 ml LV SV MOD BP:      26.6 ml RIGHT VENTRICLE             IVC RV S prime:     14.30 cm/s  IVC diam: 2.50 cm  TAPSE (M-mode): 2.1 cm LEFT ATRIUM           Index       RIGHT ATRIUM           Index LA diam:      3.40 cm 1.96 cm/m  RA Area:     10.70 cm LA Vol (A2C): 15.7 ml 9.07 ml/m  RA Volume:   23.30 ml  13.45 ml/m LA Vol (A4C): 22.0 ml 12.70 ml/m  AORTIC VALVE LVOT Vmax:   111.00 cm/s LVOT Vmean:  72.000 cm/s LVOT VTI:    0.143 m  AORTA Ao Root diam: 2.70 cm Ao Asc diam:  2.70 cm MITRAL VALVE               TRICUSPID VALVE MV Area (PHT): 3.46 cm    TR Peak grad:   23.8 mmHg MV Decel Time: 219 msec    TR Vmax:        244.00 cm/s MV E velocity: 71.57 cm/s                            SHUNTS                            Systemic VTI:  0.14 m                            Systemic Diam: 1.80 cm Marca Ancona MD Electronically signed by Marca Ancona MD Signature Date/Time: 08/03/2020/4:33:28 PM    Final    CT Maxillofacial Wo Contrast  Result Date: 08/02/2020 CLINICAL DATA:  Status post fall on stairs today. Neck pain. Initial encounter. EXAM: CT HEAD WITHOUT CONTRAST CT MAXILLOFACIAL WITHOUT CONTRAST CT CERVICAL SPINE WITHOUT CONTRAST TECHNIQUE: Multidetector CT imaging of the head, cervical spine, and maxillofacial structures were performed using the standard protocol without intravenous contrast. Multiplanar CT image reconstructions of the cervical spine and maxillofacial structures were also generated. COMPARISON:  Head and cervical spine CT scan 04/24/2020. FINDINGS: CT HEAD FINDINGS Brain: No evidence of acute infarction, hemorrhage, hydrocephalus, extra-axial collection or mass lesion/mass effect. 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 Vascular: No hyperdense vessel or unexpected calcification. Skull: No acute abnormality.  Remote right craniotomy again  seen. Other: None. CT MAXILLOFACIAL FINDINGS Osseous: No fracture or mandibular dislocation. No destructive process. Orbits: Negative. No traumatic or inflammatory finding. Sinuses: Clear. Soft tissues: Negative. CT CERVICAL SPINE FINDINGS Alignment: Maintained. Skull base and vertebrae: The patient has an acute fracture of the anterior aspect of the left lateral mass C2. There is slight anterior displacement of the fracture fragment and minimal impaction. No other fracture is identified. No lytic or sclerotic lesion. Soft tissues and spinal canal: No prevertebral fluid or swelling. No visible canal hematoma. Disc levels: Loss of disc space height and endplate spurring most notable at C5-6 and C6-7. Scattered facet degenerative change is worst on the left at C4-5. Upper chest: Lung apices are clear with centrilobular emphysema noted. Other: None. IMPRESSION: Mildly impacted and anteriorly displaced fracture of the left lateral mass of C2. No other acute abnormality head, face or cervical spine. Postoperative change of right craniotomy with underlying encephalomalacia, unchanged. 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 Emphysema (ICD10-J43.9). Critical Value/emergent results were called by telephone at the time of interpretation on 08/02/2020 at 1:49 pm to provider Pricilla Loveless , who verbally  acknowledged these results. Electronically Signed   By: Inge Rise M.D.   On: 08/02/2020 13:51    Cardiac Studies   1. Left ventricular ejection fraction, by estimation, is 55 to 60%. The  left ventricle has normal function. The left ventricle has no regional  wall motion abnormalities. Left ventricular diastolic parameters are  indeterminate.  2. Right ventricular systolic function is normal. The right ventricular  size is normal. There is mildly elevated pulmonary artery systolic  pressure. The estimated right ventricular systolic pressure is [id number] mmHg.  3. The mitral valve is normal in structure.  No evidence of mitral valve  regurgitation. No evidence of mitral stenosis.  4. The aortic valve is tricuspid. Aortic valve regurgitation is not  visualized. No aortic stenosis is present.  5. The inferior vena cava is dilated in size with <50% respiratory  variability, suggesting right atrial pressure of 15 mmHg.   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    Patient slipped down her stairs and had C2 fracture.  Noted to be in atrial fibrillation at time of admission.    Assessment & Plan    1 atrial flutter-heart rate 120-130.    Blood pressure has been borderline.  We will continue IV amiodarone.  Add metoprolol 12.5 mg twice daily.  Echocardiogram shows normal LV function.  TSH normal.  Patient is not felt to be a good candidate for anticoagulation given history of frequent seizures and also history of right occipital AVM/intracranial hemorrhage.  Also with recent C2 fracture.    2 hypokalemia-FU BMET pending this AM.  3 status post C2 fracture-management per neurosurgery.  4 history of seizures-follow-up with neurology.  For questions or updates, please contact [place] Please consult [email or web address] for contact info under        Signed, Kirk Ruths, MD  08/04/2020, 8:18 AM

## 2020-08-05 DIAGNOSIS — W19XXXA Unspecified fall, initial encounter: Secondary | ICD-10-CM | POA: Diagnosis not present

## 2020-08-05 DIAGNOSIS — I4891 Unspecified atrial fibrillation: Secondary | ICD-10-CM | POA: Diagnosis not present

## 2020-08-05 DIAGNOSIS — S12100A Unspecified displaced fracture of second cervical vertebra, initial encounter for closed fracture: Secondary | ICD-10-CM | POA: Diagnosis not present

## 2020-08-05 DIAGNOSIS — I4892 Unspecified atrial flutter: Secondary | ICD-10-CM | POA: Diagnosis not present

## 2020-08-05 LAB — COMPREHENSIVE METABOLIC PANEL
ALT: 16 U/L (ref 0–44)
AST: 20 U/L (ref 15–41)
Albumin: 2 g/dL — ABNORMAL LOW (ref 3.5–5.0)
Alkaline Phosphatase: 40 U/L (ref 38–126)
Anion gap: 7 (ref 5–15)
BUN: <5 mg/dL — ABNORMAL LOW (ref 8–23)
CO2: 26 mmol/L (ref 22–32)
Calcium: 8 mg/dL — ABNORMAL LOW (ref 8.9–10.3)
Chloride: 106 mmol/L (ref 98–111)
Creatinine, Ser: 0.65 mg/dL (ref 0.44–1.00)
GFR, Estimated: >60 mL/min (ref >60)
Glucose, Bld: 115 mg/dL — ABNORMAL HIGH (ref 70–99)
Potassium: 3.6 mmol/L (ref 3.5–5.1)
Sodium: 139 mmol/L (ref 135–145)
Total Bilirubin: 0.4 mg/dL (ref 0.3–1.2)
Total Protein: 4.7 g/dL — ABNORMAL LOW (ref 6.5–8.1)

## 2020-08-05 LAB — MAGNESIUM: Magnesium: 1.6 mg/dL — ABNORMAL LOW (ref 1.7–2.4)

## 2020-08-05 MED ORDER — DIGOXIN 0.25 MG/ML IJ SOLN
0.2500 mg | Freq: Four times a day (QID) | INTRAMUSCULAR | Status: AC
  Administered 2020-08-05 (×2): 0.25 mg via INTRAVENOUS
  Filled 2020-08-05 (×2): qty 1

## 2020-08-05 MED ORDER — DIGOXIN 125 MCG PO TABS
0.1250 mg | ORAL_TABLET | Freq: Every day | ORAL | Status: DC
  Administered 2020-08-06: 0.125 mg via ORAL
  Filled 2020-08-05: qty 1

## 2020-08-05 MED ORDER — POTASSIUM CHLORIDE CRYS ER 20 MEQ PO TBCR
60.0000 meq | EXTENDED_RELEASE_TABLET | Freq: Once | ORAL | Status: AC
  Administered 2020-08-05: 60 meq via ORAL
  Filled 2020-08-05: qty 3

## 2020-08-05 MED ORDER — MAGNESIUM SULFATE 4 GM/100ML IV SOLN
4.0000 g | Freq: Once | INTRAVENOUS | Status: AC
  Administered 2020-08-05: 4 g via INTRAVENOUS
  Filled 2020-08-05: qty 100

## 2020-08-05 NOTE — Progress Notes (Signed)
Inpatient Rehab Admissions:  Inpatient Rehab Consult received.  I met with patient at the bedside for rehabilitation assessment and to discuss goals and expectations of an inpatient rehab admission.  Pt acknowledged understanding. Pt interested in CIR.  Pt requested I call her daughter, [name] to explain CIR to her as well. Will continue to follow.  Signed: Gayland Curry, [place], [place] Admissions Coordinator [phone]

## 2020-08-05 NOTE — Progress Notes (Signed)
Progress Note  Patient Name: [name] Date of Encounter: 08/05/2020  [facility] Cardiologist: Dr Debara Pickett  Subjective   Mild dyspnea, upper chest pain that increases with palpation.  Inpatient Medications    Scheduled Meds: . Cenobamate  100 mg Oral Daily  . cholecalciferol  1,000 Units Oral Daily  . escitalopram  10 mg Oral Daily  . gabapentin  200 mg Oral QHS  . lacosamide  200 mg Oral BID  . metoprolol tartrate  12.5 mg Oral BID  . multivitamin with minerals  1 tablet Oral Daily  . phenytoin  100 mg Oral Daily   And  . phenytoin  200 mg Oral QHS  . potassium chloride  60 mEq Oral Once  . senna-docusate  2 tablet Oral QHS  . sodium chloride flush  10-40 mL Intracatheter Q12H  . vitamin B-12  1,000 mcg Oral Daily   Continuous Infusions: . sodium chloride 125 mL/hr at 08/05/20 0605  . amiodarone 30 mg/hr (08/04/20 2343)  . cefTRIAXone (ROCEPHIN)  IV 1 g (08/04/20 1721)  . magnesium sulfate bolus IVPB     PRN Meds: acetaminophen, albuterol, HYDROmorphone (DILAUDID) injection, loperamide, oxyCODONE, sodium chloride flush   Vital Signs    Vitals:   08/04/20 2000 08/04/20 2001 08/05/20 0000 08/05/20 0611  BP: 94/63 94/63 92/62  94/64  Pulse: (!) 134 (!) 120 (!) 107 (!) 114  Resp: (!) 27 20 20 19   Temp: 98.5 F (36.9 C) 98.5 F (36.9 C) 98.7 F (37.1 C) 98.8 F (37.1 C)  TempSrc: Oral Oral Oral Oral  SpO2: 94% 100% 100% 100%  Weight:      Height:        Intake/Output Summary (Last 24 hours) at 08/05/2020 0807 Last data filed at 08/05/2020 0616 Gross per 24 hour  Intake 724.19 ml  Output -  Net 724.19 ml   Last 3 Weights 08/02/2020 06/06/2020 04/24/2020  Weight (lbs) 138 lb 14.2 oz 138 lb 130 lb  Weight (kg) 63 kg 62.596 kg 58.968 kg      Telemetry    Atrial fibrillation/flutter with rate elevated- Personally Reviewed  Physical Exam   GEN: WD, WN No acute distress.  C collar Neck: Difficult to assess due to C collar Cardiac: irregular and  tachycardic Respiratory: CTA anteriorly; no wheeze; chest tender to palpation. GI: Soft, NT/ND, no masses MS: No edema Neuro: No focal findings Psych: Normal affect   Labs    High Sensitivity Troponin:   Recent Labs  Lab 08/02/20 1137 08/02/20 1140 08/02/20 1935  TROPONINIHS 25* 24* 27*      Chemistry Recent Labs  Lab 08/02/20 1137 08/02/20 1205 08/03/20 0001 08/04/20 0703 08/05/20 0407  NA 138   < > 140 140 139  K 3.2*   < > 2.7* 3.6 3.6  CL 100   < > 109 107 106  CO2 26  --  21* 23 26  GLUCOSE 146*   < > 112* 114* 115*  BUN 14   < > 8 <5* <5*  CREATININE 1.02*   < > 0.74 0.57 0.65  CALCIUM 8.7*  --  7.3* 7.7* 8.0*  PROT 6.6  --  4.8*  --  4.7*  ALBUMIN 2.8*  --  2.1*  --  2.0*  AST 25  --  23  --  20  ALT 21  --  16  --  16  ALKPHOS 54  --  46  --  40  BILITOT 0.3  --  0.5  --  0.4  GFRNONAA >60  --  >60 >60 >60  ANIONGAP 12  --  10 10 7    < > = values in this interval not displayed.     Hematology Recent Labs  Lab 08/02/20 1137 08/02/20 1205 08/03/20 0001 08/04/20 0703  WBC 8.3  --  6.3 4.8  RBC 3.83*  --  3.20* 3.29*  HGB 13.3 13.3 10.8* 10.9*  HCT 38.9 39.0 32.8* 33.9*  MCV 101.6*  --  102.5* 103.0*  MCH 34.7*  --  33.8 33.1  MCHC 34.2  --  32.9 32.2  RDW 11.9  --  11.9 12.0  PLT 156  --  110* 134*     Radiology    ECHOCARDIOGRAM COMPLETE  Result Date: 08/03/2020    ECHOCARDIOGRAM REPORT   Patient Name:   TYHESHA HOOPER Date of Exam: 08/03/2020 Medical Rec #:  [id number]      Height:       67.0 in Accession #:    [id number]     Weight:       138.9 lb Date of Birth:  03/21/1956      BSA:          1.732 m Patient Age:    63 years       BP:           92/56 mmHg Patient Gender: F              HR:           127 bpm. Exam Location:  Inpatient Procedure: 2D Echo, Cardiac Doppler and Color Doppler Indications:    R55 Syncope; I48.91* Unspeicified atrial fibrillation  History:        Patient has no prior history of Echocardiogram examinations.                  Abnormal ECG, COPD and Stroke, Arrythmias:Atrial Flutter and                 Atrial Fibrillation, Signs/Symptoms:Fever and Chest Pain; Risk                 Factors:Current Smoker. Hypoxia. History of covid infection.  Sonographer:    Roseanna Rainbow RDCS Referring Phys: [id number] [place]  Sonographer Comments: Technically difficult study due to poor echo windows. Cervical brace. IMPRESSIONS  1. Left ventricular ejection fraction, by estimation, is 55 to 60%. The left ventricle has normal function. The left ventricle has no regional wall motion abnormalities. Left ventricular diastolic parameters are indeterminate.  2. Right ventricular systolic function is normal. The right ventricular size is normal. There is mildly elevated pulmonary artery systolic pressure. The estimated right ventricular systolic pressure is [id number] mmHg.  3. The mitral valve is normal in structure. No evidence of mitral valve regurgitation. No evidence of mitral stenosis.  4. The aortic valve is tricuspid. Aortic valve regurgitation is not visualized. No aortic stenosis is present.  5. The inferior vena cava is dilated in size with <50% respiratory variability, suggesting right atrial pressure of 15 mmHg. FINDINGS  Left Ventricle: Left ventricular ejection fraction, by estimation, is 55 to 60%. The left ventricle has normal function. The left ventricle has no regional wall motion abnormalities. The left ventricular internal cavity size was normal in size. There is  no left ventricular hypertrophy. Left ventricular diastolic parameters are indeterminate. Right Ventricle: The right ventricular size is normal. No increase in right ventricular wall thickness. Right ventricular systolic function is normal. There is mildly elevated  pulmonary artery systolic pressure. The tricuspid regurgitant velocity is 2.44  m/s, and with an assumed right atrial pressure of 15 mmHg, the estimated right ventricular systolic pressure is 38.8 mmHg. Left Atrium: Left  atrial size was normal in size. Right Atrium: Right atrial size was normal in size. Pericardium: Trivial pericardial effusion is present. Mitral Valve: The mitral valve is normal in structure. No evidence of mitral valve regurgitation. No evidence of mitral valve stenosis. Tricuspid Valve: The tricuspid valve is normal in structure. Tricuspid valve regurgitation is trivial. Aortic Valve: The aortic valve is tricuspid. Aortic valve regurgitation is not visualized. No aortic stenosis is present. Pulmonic Valve: The pulmonic valve was normal in structure. Pulmonic valve regurgitation is not visualized. Aorta: The aortic root is normal in size and structure. Venous: The inferior vena cava is dilated in size with less than 50% respiratory variability, suggesting right atrial pressure of 15 mmHg. IAS/Shunts: No atrial level shunt detected by color flow Doppler.  LEFT VENTRICLE PLAX 2D LVIDd:         4.20 cm LVIDs:         3.30 cm LV PW:         1.30 cm LV IVS:        0.90 cm LVOT diam:     1.80 cm LV SV:         36 LV SV Index:   21 LVOT Area:     2.54 cm  LV Volumes (MOD) LV vol d, MOD A2C: 43.3 ml LV vol d, MOD A4C: 40.3 ml LV vol s, MOD A2C: 15.2 ml LV vol s, MOD A4C: 18.4 ml LV SV MOD A2C:     28.1 ml LV SV MOD A4C:     40.3 ml LV SV MOD BP:      26.6 ml RIGHT VENTRICLE             IVC RV S prime:     14.30 cm/s  IVC diam: 2.50 cm TAPSE (M-mode): 2.1 cm LEFT ATRIUM           Index       RIGHT ATRIUM           Index LA diam:      3.40 cm 1.96 cm/m  RA Area:     10.70 cm LA Vol (A2C): 15.7 ml 9.07 ml/m  RA Volume:   23.30 ml  13.45 ml/m LA Vol (A4C): 22.0 ml 12.70 ml/m  AORTIC VALVE LVOT Vmax:   111.00 cm/s LVOT Vmean:  72.000 cm/s LVOT VTI:    0.143 m  AORTA Ao Root diam: 2.70 cm Ao Asc diam:  2.70 cm MITRAL VALVE               TRICUSPID VALVE MV Area (PHT): 3.46 cm    TR Peak grad:   23.8 mmHg MV Decel Time: 219 msec    TR Vmax:        244.00 cm/s MV E velocity: 71.57 cm/s                            SHUNTS                             Systemic VTI:  0.14 m  Systemic Diam: 1.80 cm Loralie Champagne MD Electronically signed by Loralie Champagne MD Signature Date/Time: 08/03/2020/4:33:28 PM    Final     Patient Profile     65 year old female with past medical history of asthma, COPD, 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    Patient slipped down her stairs and had C2 fracture.  Noted to be in atrial fibrillation at time of admission.    Assessment & Plan    1 atrial fibrillation/flutter-heart rate 120-130.  Blood pressure remains borderline. We will continue IV amiodarone and low-dose metoprolol.  Will give digoxin (0.5 mg IV load total today then 0.125 mg daily; check dig level in one week; may need reduced dose given potential amiodarone interaction).  Echocardiogram shows normal LV function.  TSH normal.  Patient is not felt to be a good candidate for anticoagulation given history of frequent seizures and also history of right occipital AVM/intracranial hemorrhage.  Also with recent C2 fracture.    2 hypokalemia-resolved  3 status post C2 fracture-management per neurosurgery.  4 history of seizures-follow-up with neurology.  For questions or updates, please contact [place] Please consult [email or web address] for contact info under        Signed, Kirk Ruths, MD  08/05/2020, 8:07 AM

## 2020-08-05 NOTE — Progress Notes (Signed)
PROGRESS NOTE    [name]  [id number] DOB: May 03, 1956 DOA: 08/02/2020 PCP: Pearson Grippe, MD    Brief Narrative:  65 y.o. 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  Associated with immediate left hand pain, neck pain and occipital headache.  History is obtained from the patient however it is limited due to pain from her fractures.  She does not know whether or not she tripped and fell but she thinks she passed out.  Not postictal.  She is alert and oriented x3.  Reports prior to this event she was having dysuria for a few days.  Denies any fevers or chills.  No nausea or abdominal pain.  No prior cardiopulmonary symptoms.  She had another fall about a week ago and does not remember the circumstances around the fall.  Denies any prior history of A. fib.  Work-up in the ED revealed new onset A. fib with RVR, urine analysis positive for pyuria, C2 lateral mass fracture, left hand/finger fracture, possible left pyelonephritis on CT scan.  She was seen by neurosurgery, recommended conservative management, to wear a c-collar at all times.  She now has a left upper extremity splint in place.  Cardiology was consulted to assist with new onset A. fib with RVR.  TRH was asked to admit for further work-up and management of present condition.  Assessment & Plan:   Active Problems:   Fall   Fall, unclear etiology -On further questioning this AM, pt reports remembering events of falling and states "missing a step" up a staircase. -2d echo performed, findings of normal LVEF -Therapy recs for CIR when stable -Per therapy, there are concerns for possible vestibular issues  resulting in loss of balance. PT to follow  New onset rapid aflutter No prior history of A. Fib Remains tachycardic Was continued on Cardizem drip, however was later d/c secondary to hypotension 2d echo per above Cardiology following. Currently on amiodarone IV. Metoprolol was added per Cardiology with continued rapid rate Dig added 1/2 Recheck bmet in AM  Elevated troponin likely demand ischemia in the setting of A. fib with RVR Troponin peaked at 25 and trended down Suspect demand ischemia related to rapid heart rate 2d echo as per above Cardiology continues to follow  Pan-sensitive ecoli UTI with suspected pyelonephritis on CT scan History of ESBL E. coli in 2019 Presented with urine analysis positive for pyuria Obtain urine culture pos for pan-sensitive ecoli Initially started on meropenem, now on rocephin No leukocytosis To complete abx on 1/3  C2 left lateral mass fracture post fall Seen by neurosurgery Recommended treatment with aspen c-collar at all times Cont with analgesia as needed  Left hand/finger fracture post fall She currently has a splint in place Continue with analgesia with bowel regimen  Prolonged QTC Presented with QTC of 545 Avoid QTC prolonging agents Plan to keep K>4 and Mg>2  Seizure disorder Subtherapeutic level of Dilantin, 2.9 Not postictal at the time of this visit Resume home regimen, she is on several seizure medications Pharmacy assisting with dosage Seizure precautions. Thus far seizure free  Polyneuropathy Resume home Neurontin  Chronic anxiety/depression Stable She is on escitalopram  Hypokalemia Presented with serum potassium 3.2 Magnesium 2.1 Repeat  lytes in AM  Hyperglycemia likely stress-induced in the setting of fractures Hgb A1c 5.0 Monitor for now  Prior tobacco abuse history -Reported long hx of prior tobacco abuse -Pt elected to quit smoking 4 years ago and remains tobacco free -Pt  congratulated  DVT prophylaxis: SCD's Code Status: Full Family Communication: Pt in room, family is currently at bedside  Status is: Inpatient  Remains inpatient appropriate because:Hemodynamically unstable, IV treatments appropriate due to intensity of illness or inability to take PO and Inpatient level of care appropriate due to severity of illness  Dispo:  Patient From: Home  Planned Disposition: Home with Health Care Svc  Expected discharge date: 08/06/2020  Medically stable for discharge: No   Consultants:   Cardiology  Procedures:     Antimicrobials: Anti-infectives (From admission, onward)   Start     Dose/Rate Route Frequency Ordered Stop   08/04/20 1700  cefTRIAXone (ROCEPHIN) 1 g in sodium chloride 0.9 % 100 mL IVPB  Status:  Discontinued       Note to Pharmacy: Pan-sensitive UTI on urine cx   1 g 200 mL/hr over 30 Minutes Intravenous Every 24 hours 08/04/20 1600 08/04/20 1601   08/04/20 1700  cefTRIAXone (ROCEPHIN) 1 g in sodium chloride 0.9 % 100 mL IVPB       Note to Pharmacy: Pan-sensitive UTI on urine cx   1 g 200 mL/hr over 30 Minutes Intravenous Every 24 hours 08/04/20 1601 08/06/20 1659   08/03/20 1500  cefTRIAXone (ROCEPHIN) 2 g in sodium chloride 0.9 % 100 mL IVPB  Status:  Discontinued        2 g 200 mL/hr over 30 Minutes Intravenous Every 24 hours 08/02/20 1547 08/02/20 1557   08/02/20 1730  meropenem (MERREM) 1 g in sodium chloride 0.9 % 100 mL IVPB  Status:  Discontinued        1 g 200 mL/hr over 30 Minutes Intravenous Every 8 hours 08/02/20 1641 08/04/20 1600   08/02/20 1500  cefTRIAXone (ROCEPHIN) 1 g in sodium chloride 0.9 % 100 mL IVPB        1 g 200 mL/hr over 30 Minutes Intravenous  Once 08/02/20 1458 08/02/20 1628      Subjective: Complains of feeling generally uncomfortable  Objective: Vitals:   08/05/20 0611 08/05/20 0900 08/05/20 1100 08/05/20 1551  BP: 94/64 102/61 (!) 98/52 111/74  Pulse: (!) 114 (!) 119 (!) 120 (!) 122  Resp:  19 19 20 20   Temp: 98.8 F (37.1 C) 98 F (36.7 C) 98.3 F (36.8 C) 98 F (36.7 C)  TempSrc: Oral Oral Oral Oral  SpO2: 100% 94% 95% 98%  Weight:      Height:        Intake/Output Summary (Last 24 hours) at 08/05/2020 1623 Last data filed at 08/05/2020 [id number] Gross per 24 hour  Intake 484.19 ml  Output -  Net 484.19 ml   Filed Weights   08/02/20 1600  Weight: 63 kg    Examination: General exam: Awake, laying in bed, appears mildly uncomfortable, neck collar in place Respiratory system: Normal respiratory effort, no wheezing Cardiovascular system: regular rate, s1, s2 Gastrointestinal system: Soft, nondistended, positive BS Central nervous system: CN2-12 grossly intact, strength intact Extremities: Perfused, no clubbing Skin: Normal skin turgor, no notable skin lesions seen Psychiatry: Mood normal // no visual hallucinations   Data Reviewed: I have personally reviewed following labs and imaging studies  CBC: Recent Labs  Lab 08/02/20 1137 08/02/20 1205 08/03/20 0001 08/04/20 0703  WBC 8.3  --  6.3 4.8  NEUTROABS 6.7  --   --  3.5  HGB 13.3 13.3 10.8* 10.9*  HCT 38.9 39.0 32.8* 33.9*  MCV 101.6*  --  102.5* 103.0*  PLT 156  --  110* [id number]*   Basic Metabolic Panel: Recent Labs  Lab 08/02/20 1137 08/02/20 1140 08/02/20 1205 08/03/20 0001 08/04/20 0703 08/05/20 0407  NA 138  --  139 140 140 139  K 3.2*  --  3.2* 2.7* 3.6 3.6  CL 100  --  100 109 107 106  CO2 26  --   --  21* 23 26  GLUCOSE 146*  --  140* 112* 114* 115*  BUN 14  --  14 8 <5* <5*  CREATININE 1.02*  --  0.90 0.74 0.57 0.65  CALCIUM 8.7*  --   --  7.3* 7.7* 8.0*  MG  --  2.1  --  1.6* 1.8 1.6*   GFR: Estimated Creatinine Clearance: 70.9 mL/min (by C-G formula based on SCr of 0.65 mg/dL). Liver Function Tests: Recent Labs  Lab 08/02/20 1137 08/03/20 0001 08/05/20 0407  AST 25 23 20   ALT 21 16 16   ALKPHOS 54 46 40  BILITOT 0.3 0.5 0.4  PROT 6.6 4.8* 4.7*  ALBUMIN 2.8* 2.1* 2.0*   No  results for input(s): LIPASE, AMYLASE in the last 168 hours. No results for input(s): AMMONIA in the last 168 hours. Coagulation Profile: No results for input(s): INR, PROTIME in the last 168 hours. Cardiac Enzymes: No results for input(s): CKTOTAL, CKMB, CKMBINDEX, TROPONINI in the last 168 hours. BNP (last 3 results) No results for input(s): PROBNP in the last 8760 hours. HbA1C: No results for input(s): HGBA1C in the last 72 hours. CBG: No results for input(s): GLUCAP in the last 168 hours. Lipid Profile: No results for input(s): CHOL, HDL, LDLCALC, TRIG, CHOLHDL, LDLDIRECT in the last 72 hours. Thyroid Function Tests: No results for input(s): TSH, T4TOTAL, FREET4, T3FREE, THYROIDAB in the last 72 hours. Anemia Panel: No results for input(s): VITAMINB12, FOLATE, FERRITIN, TIBC, IRON, RETICCTPCT in the last 72 hours. Sepsis Labs: No results for input(s): PROCALCITON, LATICACIDVEN in the last 168 hours.  Recent Results (from the past 240 hour(s))  Resp Panel by RT-PCR (Flu A&B, Covid) Nasopharyngeal Swab     Status: None   Collection Time: 08/02/20 11:56 AM   Specimen: Nasopharyngeal Swab; Nasopharyngeal(NP) swabs in vial transport medium  Result Value Ref Range Status   SARS Coronavirus 2 by RT PCR NEGATIVE NEGATIVE Final    Comment: (NOTE) SARS-CoV-2 target nucleic acids are NOT DETECTED.  The SARS-CoV-2 RNA is generally detectable in upper respiratory specimens during the acute phase of infection. The lowest concentration of SARS-CoV-2 viral copies this assay can detect is 138 copies/mL. A negative result does not preclude SARS-Cov-2 infection and should not be used as the sole basis for treatment or other patient management decisions. A negative result may occur with  improper specimen collection/handling, submission of specimen other than nasopharyngeal swab, presence of viral mutation(s) within the areas targeted by this assay, and inadequate number of viral copies(<138  copies/mL). A negative result must be combined with clinical observations, patient history, and epidemiological information. The expected result is Negative.  Fact Sheet for Patients:  EntrepreneurPulse.com.au  Fact Sheet for Healthcare Providers:  IncredibleEmployment.be  This test is no t yet approved or cleared by the [place] FDA and  has been authorized for detection and/or diagnosis of SARS-CoV-2 by FDA under an  Emergency Use Authorization (EUA). This EUA will remain  in effect (meaning this test can be used) for the duration of the COVID-19 declaration under Section 564(b)(1) of the Act, 21 U.S.C.section 360bbb-3(b)(1), unless the authorization is terminated  or revoked sooner.       Influenza A by PCR NEGATIVE NEGATIVE Final   Influenza B by PCR NEGATIVE NEGATIVE Final    Comment: (NOTE) The Xpert Xpress SARS-CoV-2/FLU/RSV plus assay is intended as an aid in the diagnosis of influenza from Nasopharyngeal swab specimens and should not be used as a sole basis for treatment. Nasal washings and aspirates are unacceptable for Xpert Xpress SARS-CoV-2/FLU/RSV testing.  Fact Sheet for Patients: EntrepreneurPulse.com.au  Fact Sheet for Healthcare Providers: IncredibleEmployment.be  This test is not yet approved or cleared by the [place] FDA and has been authorized for detection and/or diagnosis of SARS-CoV-2 by FDA under an Emergency Use Authorization (EUA). This EUA will remain in effect (meaning this test can be used) for the duration of the COVID-19 declaration under Section 564(b)(1) of the Act, 21 U.S.C. section 360bbb-3(b)(1), unless the authorization is terminated or revoked.  Performed at [facility], [place] [address], [place], [place] [address]   Urine culture     Status: Abnormal   Collection Time: 08/02/20  2:00 PM   Specimen: Urine, Random  Result Value Ref Range Status    Specimen Description URINE, RANDOM  Final   Special Requests   Final    NONE Performed at [facility], [place] [address] [place], [place] [address]    Culture >=100,000 COLONIES/mL ESCHERICHIA COLI (A)  Final   Report Status 08/04/2020 FINAL  Final   Organism ID, Bacteria ESCHERICHIA COLI (A)  Final      Susceptibility   Escherichia coli - MIC*    AMPICILLIN <=2 SENSITIVE Sensitive     CEFAZOLIN <=4 SENSITIVE Sensitive     CEFEPIME <=0.12 SENSITIVE Sensitive     CEFTRIAXONE <=0.25 SENSITIVE Sensitive     CIPROFLOXACIN <=0.25 SENSITIVE Sensitive     GENTAMICIN <=1 SENSITIVE Sensitive     IMIPENEM <=0.25 SENSITIVE Sensitive     NITROFURANTOIN <=16 SENSITIVE Sensitive     TRIMETH/SULFA <=20 SENSITIVE Sensitive     AMPICILLIN/SULBACTAM <=2 SENSITIVE Sensitive     PIP/TAZO <=4 SENSITIVE Sensitive     * >=100,000 COLONIES/mL ESCHERICHIA COLI  Culture, blood (routine x 2)     Status: None (Preliminary result)   Collection Time: 08/02/20 11:20 PM   Specimen: BLOOD RIGHT HAND  Result Value Ref Range Status   Specimen Description BLOOD RIGHT HAND  Final   Special Requests   Final    BOTTLES DRAWN AEROBIC AND ANAEROBIC Blood Culture adequate volume   Culture   Final    NO GROWTH 2 DAYS Performed at [facility], [address], [place], [place] [address]    Report Status PENDING  Incomplete  Culture, blood (routine x 2)     Status: None (Preliminary result)   Collection Time: 08/02/20 11:35 PM   Specimen: BLOOD  Result Value Ref Range Status   Specimen Description BLOOD RT THUMB  Final   Special Requests AEROBIC BOTTLE ONLY Blood Culture adequate volume  Final   Culture   Final    NO GROWTH 2 DAYS Performed at [facility], [place] [address], [place], [place] [address]    Report Status PENDING  Incomplete     Radiology Studies: No results found.  Scheduled Meds: .  Cenobamate  100 mg Oral Daily  . cholecalciferol  1,000 Units Oral Daily  . [START ON 08/06/2020]  digoxin  0.125 mg Oral Daily  . escitalopram  10 mg Oral Daily  . gabapentin  200 mg Oral QHS  . lacosamide  200 mg Oral BID  . metoprolol tartrate  12.5 mg Oral BID  . multivitamin with minerals  1 tablet Oral Daily  . phenytoin  100 mg Oral Daily   And  . phenytoin  200 mg Oral QHS  . senna-docusate  2 tablet Oral QHS  . sodium chloride flush  10-40 mL Intracatheter Q12H  . vitamin B-12  1,000 mcg Oral Daily   Continuous Infusions: . sodium chloride 125 mL/hr at 08/05/20 0605  . amiodarone 30 mg/hr (08/05/20 1106)  . cefTRIAXone (ROCEPHIN)  IV 1 g (08/04/20 1721)     LOS: 3 days   Marylu Lund, MD [facility] Pager On Amion  If 7PM-7AM, please contact night-coverage 08/05/2020, 4:23 PM

## 2020-08-06 DIAGNOSIS — S63259A Unspecified dislocation of unspecified finger, initial encounter: Secondary | ICD-10-CM | POA: Diagnosis not present

## 2020-08-06 DIAGNOSIS — I483 Typical atrial flutter: Secondary | ICD-10-CM | POA: Diagnosis not present

## 2020-08-06 DIAGNOSIS — S12100A Unspecified displaced fracture of second cervical vertebra, initial encounter for closed fracture: Secondary | ICD-10-CM | POA: Diagnosis not present

## 2020-08-06 DIAGNOSIS — W19XXXA Unspecified fall, initial encounter: Secondary | ICD-10-CM | POA: Diagnosis not present

## 2020-08-06 LAB — MAGNESIUM: Magnesium: 2.1 mg/dL (ref 1.7–2.4)

## 2020-08-06 LAB — COMPREHENSIVE METABOLIC PANEL
ALT: 18 U/L (ref 0–44)
AST: 26 U/L (ref 15–41)
Albumin: 1.8 g/dL — ABNORMAL LOW (ref 3.5–5.0)
Alkaline Phosphatase: 45 U/L (ref 38–126)
Anion gap: 11 (ref 5–15)
BUN: <5 mg/dL — ABNORMAL LOW (ref 8–23)
CO2: 23 mmol/L (ref 22–32)
Calcium: 7.7 mg/dL — ABNORMAL LOW (ref 8.9–10.3)
Chloride: 104 mmol/L (ref 98–111)
Creatinine, Ser: 0.73 mg/dL (ref 0.44–1.00)
GFR, Estimated: >60 mL/min (ref >60)
Glucose, Bld: 138 mg/dL — ABNORMAL HIGH (ref 70–99)
Potassium: 4.1 mmol/L (ref 3.5–5.1)
Sodium: 138 mmol/L (ref 135–145)
Total Bilirubin: 0.5 mg/dL (ref 0.3–1.2)
Total Protein: 4.3 g/dL — ABNORMAL LOW (ref 6.5–8.1)

## 2020-08-06 MED ORDER — MECLIZINE HCL 25 MG PO TABS
25.0000 mg | ORAL_TABLET | Freq: Three times a day (TID) | ORAL | Status: DC | PRN
  Filled 2020-08-06: qty 1

## 2020-08-06 MED ORDER — SODIUM CHLORIDE 0.9 % IV BOLUS
500.0000 mL | Freq: Once | INTRAVENOUS | Status: AC
  Administered 2020-08-06: 500 mL via INTRAVENOUS

## 2020-08-06 MED ORDER — AMIODARONE HCL 200 MG PO TABS
200.0000 mg | ORAL_TABLET | Freq: Two times a day (BID) | ORAL | Status: DC
  Administered 2020-08-06 – 2020-08-09 (×7): 200 mg via ORAL
  Filled 2020-08-06 (×7): qty 1

## 2020-08-06 MED ORDER — SODIUM CHLORIDE 0.9 % IV BOLUS
1000.0000 mL | Freq: Once | INTRAVENOUS | Status: AC
  Administered 2020-08-06: 1000 mL via INTRAVENOUS

## 2020-08-06 MED ORDER — SODIUM CHLORIDE 0.9 % IV SOLN: INTRAVENOUS | Status: AC

## 2020-08-06 NOTE — Progress Notes (Signed)
PROGRESS NOTE    [name]  [id number] DOB: 29-May-1956 DOA: 08/02/2020 PCP: [name], MD    Brief Narrative:  65 y.o. 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  Associated with immediate left hand pain, neck pain and occipital headache.  History is obtained from the patient however it is limited due to pain from her fractures.  She does not know whether or not she tripped and fell but she thinks she passed out.  Not postictal.  She is alert and oriented x3.  Reports prior to this event she was having dysuria for a few days.  Denies any fevers or chills.  No nausea or abdominal pain.  No prior cardiopulmonary symptoms.  She had another fall about a week ago and does not remember the circumstances around the fall.  Denies any prior history of A. fib.  Work-up in the ED revealed new onset A. fib with RVR, urine analysis positive for pyuria, C2 lateral mass fracture, left hand/finger fracture, possible left pyelonephritis on CT scan.  She was seen by neurosurgery, recommended conservative management, to wear a c-collar at all times.  She now has a left upper extremity splint in place.  Cardiology was consulted to assist with new onset A. fib with RVR.  TRH was asked to admit for further work-up and management of present condition.  Assessment & Plan:   Active Problems:   Fall   Fall, unclear etiology -On further questioning this AM, pt reports remembering events of falling and states "missing a step" up a staircase. -2d echo performed, findings of normal LVEF -Therapy recs for CIR when stable -Per therapy, concerns of vertigo on ambulation with PT  New  onset rapid aflutter No prior history of A. Fib Remains tachycardic Was continued on Cardizem drip, however was later d/c secondary to hypotension 2d echo per above Cardiology following. Had been on amiodarone IV with metoprolol and dig later added Overnight, pt converted to sinus Now on PO amiodarone Recheck bmet in AM  Elevated troponin likely demand ischemia in the setting of A. fib with RVR Troponin peaked at 25 and trended down Suspect demand ischemia related to rapid heart rate 2d echo as per above Cardiology continues to follow  Pan-sensitive ecoli UTI with suspected pyelonephritis on CT scan History of ESBL E. coli in 2019 Presented with urine analysis positive for pyuria Obtain urine culture pos for pan-sensitive ecoli Initially started on meropenem, now on rocephin No leukocytosis To complete abx today  C2 left lateral mass fracture post fall Seen by neurosurgery Recommended treatment with aspen c-collar at all times Cont with analgesia as needed  Left hand/finger fracture post fall She currently has a splint in place Continue with analgesia with bowel regimen  Prolonged QTC Presented with QTC of 545 Avoid QTC prolonging agents Anticipate keeping K>4 and Mg>2  Seizure disorder Subtherapeutic level of Dilantin, 2.9 Not postictal at the time of this visit Resume home regimen, she is on several seizure medications Pharmacy assisting with dosage Seizure precautions. Remains seizure free  Polyneuropathy Resume home Neurontin  Chronic anxiety/depression Stable She is on escitalopram  Hypokalemia Cont to correct Repeat lytes in AM  Hyperglycemia likely stress-induced in the setting of  fractures Hgb A1c 5.0 Monitor for now  Prior tobacco abuse history -Reported long hx of prior tobacco abuse -Pt elected to quit smoking 4 years ago and remains tobacco free -Pt congratulated  DVT prophylaxis: SCD's Code Status: Full Family Communication:  Pt in room, family is currently at bedside  Status is: Inpatient  Remains inpatient appropriate because:Hemodynamically unstable, IV treatments appropriate due to intensity of illness or inability to take PO and Inpatient level of care appropriate due to severity of illness  Dispo:  Patient From: Home  Planned Disposition: Home with Health Care Svc  Expected discharge date: 08/08/2020  Medically stable for discharge: No   Consultants:   Cardiology  Procedures:     Antimicrobials: Anti-infectives (From admission, onward)   Start     Dose/Rate Route Frequency Ordered Stop   08/04/20 1700  cefTRIAXone (ROCEPHIN) 1 g in sodium chloride 0.9 % 100 mL IVPB  Status:  Discontinued       Note to Pharmacy: Pan-sensitive UTI on urine cx   1 g 200 mL/hr over 30 Minutes Intravenous Every 24 hours 08/04/20 1600 08/04/20 1601   08/04/20 1700  cefTRIAXone (ROCEPHIN) 1 g in sodium chloride 0.9 % 100 mL IVPB       Note to Pharmacy: Pan-sensitive UTI on urine cx   1 g 200 mL/hr over 30 Minutes Intravenous Every 24 hours 08/04/20 1601 08/05/20 1721   08/03/20 1500  cefTRIAXone (ROCEPHIN) 2 g in sodium chloride 0.9 % 100 mL IVPB  Status:  Discontinued        2 g 200 mL/hr over 30 Minutes Intravenous Every 24 hours 08/02/20 1547 08/02/20 1557   08/02/20 1730  meropenem (MERREM) 1 g in sodium chloride 0.9 % 100 mL IVPB  Status:  Discontinued        1 g 200 mL/hr over 30 Minutes Intravenous Every 8 hours 08/02/20 1641 08/04/20 1600   08/02/20 1500  cefTRIAXone (ROCEPHIN) 1 g in sodium chloride 0.9 % 100 mL IVPB        1 g 200 mL/hr over 30 Minutes Intravenous  Once 08/02/20 1458 08/02/20 1628      Subjective: Felt dizzy while working with PT  Objective: Vitals:   08/06/20 0206 08/06/20 0341 08/06/20 0852 08/06/20 1237  BP: (!) 85/54 (!) 96/48 (!) 93/50 (!) 103/45  Pulse: (!) 104 78 79 79  Resp: 20 20 19 20   Temp: 97.8 F (36.6 C) 98.2 F (36.8 C) 99.3 F (37.4 C) 97.8 F (36.6 C)   TempSrc: Oral Oral Oral Oral  SpO2: 98% 100% 99% 92%  Weight:      Height:        Intake/Output Summary (Last 24 hours) at 08/06/2020 1511 Last data filed at 08/06/2020 [id number] Gross per 24 hour  Intake 5324.02 ml  Output 300 ml  Net 5024.02 ml   Filed Weights   08/02/20 1600  Weight: 63 kg    Examination: General exam: Conversant, in no acute distress Respiratory system: normal chest rise, clear, no audible wheezing Cardiovascular system: regular rhythm, s1-s2 Gastrointestinal system: Nondistended, nontender, pos BS Central nervous system: No seizures, no tremors Extremities: No cyanosis, no joint deformities Skin: No rashes, no pallor Psychiatry: Affect normal // no auditory hallucinations   Data Reviewed: I have personally reviewed following labs and imaging studies  CBC: Recent Labs  Lab 08/02/20 1137 08/02/20 1205 08/03/20 0001 08/04/20 0703  WBC 8.3  --  6.3 4.8  NEUTROABS 6.7  --   --  3.5  HGB 13.3 13.3 10.8* 10.9*  HCT 38.9 39.0 32.8* 33.9*  MCV 101.6*  --  102.5* 103.0*  PLT 156  --  110* [id number]*   Basic Metabolic Panel: Recent Labs  Lab 08/02/20 1137 08/02/20 1140 08/02/20 1205 08/03/20 0001 08/04/20 0703 08/05/20 0407 08/06/20 0053  NA 138  --  139 140 140 139 138  K 3.2*  --  3.2* 2.7* 3.6 3.6 4.1  CL 100  --  100 109 107 106 104  CO2 26  --   --  21* 23 26 23   GLUCOSE 146*  --  140* 112* 114* 115* 138*  BUN 14  --  14 8 <5* <5* <5*  CREATININE 1.02*  --  0.90 0.74 0.57 0.65 0.73  CALCIUM 8.7*  --   --  7.3* 7.7* 8.0* 7.7*  MG  --  2.1  --  1.6* 1.8 1.6* 2.1   GFR: Estimated Creatinine Clearance: 70.9 mL/min (by C-G formula based on SCr of 0.73 mg/dL). Liver Function Tests: Recent Labs  Lab 08/02/20 1137 08/03/20 0001 08/05/20 0407 08/06/20 0053  AST 25 23 20 26   ALT 21 16 16 18   ALKPHOS 54 46 40 45  BILITOT 0.3 0.5 0.4 0.5  PROT 6.6 4.8* 4.7* 4.3*  ALBUMIN 2.8* 2.1* 2.0* 1.8*   No results for input(s): LIPASE, AMYLASE in the last  168 hours. No results for input(s): AMMONIA in the last 168 hours. Coagulation Profile: No results for input(s): INR, PROTIME in the last 168 hours. Cardiac Enzymes: No results for input(s): CKTOTAL, CKMB, CKMBINDEX, TROPONINI in the last 168 hours. BNP (last 3 results) No results for input(s): PROBNP in the last 8760 hours. HbA1C: No results for input(s): HGBA1C in the last 72 hours. CBG: No results for input(s): GLUCAP in the last 168 hours. Lipid Profile: No results for input(s): CHOL, HDL, LDLCALC, TRIG, CHOLHDL, LDLDIRECT in the last 72 hours. Thyroid Function Tests: No results for input(s): TSH, T4TOTAL, FREET4, T3FREE, THYROIDAB in the last 72 hours. Anemia Panel: No results for input(s): VITAMINB12, FOLATE, FERRITIN, TIBC, IRON, RETICCTPCT in the last 72 hours. Sepsis Labs: No results for input(s): PROCALCITON, LATICACIDVEN in the last 168 hours.  Recent Results (from the past 240 hour(s))  Resp Panel by RT-PCR (Flu A&B, Covid) Nasopharyngeal Swab     Status: None   Collection Time: 08/02/20 11:56 AM   Specimen: Nasopharyngeal Swab; Nasopharyngeal(NP) swabs in vial transport medium  Result Value Ref Range Status   SARS Coronavirus 2 by RT PCR NEGATIVE NEGATIVE Final    Comment: (NOTE) SARS-CoV-2 target nucleic acids are NOT DETECTED.  The SARS-CoV-2 RNA is generally detectable in upper respiratory specimens during the acute phase of infection. The lowest concentration of SARS-CoV-2 viral copies this assay can detect is 138 copies/mL. A negative result does not preclude SARS-Cov-2 infection and should not be used as the sole basis for treatment or other patient management decisions. A negative result may occur with  improper specimen collection/handling, submission of specimen other than nasopharyngeal swab, presence of viral mutation(s) within the areas targeted by this assay, and inadequate number of viral copies(<138 copies/mL). A negative result must be combined  with clinical observations, patient history, and epidemiological information. The expected result is Negative.  Fact Sheet for Patients:  EntrepreneurPulse.com.au  Fact Sheet for Healthcare Providers:  IncredibleEmployment.be  This test is no t yet approved or cleared by the [place] FDA and  has been authorized for detection and/or diagnosis of SARS-CoV-2 by FDA under  an Emergency Use Authorization (EUA). This EUA will remain  in effect (meaning this test can be used) for the duration of the COVID-19 declaration under Section 564(b)(1) of the Act, 21 U.S.C.section 360bbb-3(b)(1), unless the authorization is terminated  or revoked sooner.       Influenza A by PCR NEGATIVE NEGATIVE Final   Influenza B by PCR NEGATIVE NEGATIVE Final    Comment: (NOTE) The Xpert Xpress SARS-CoV-2/FLU/RSV plus assay is intended as an aid in the diagnosis of influenza from Nasopharyngeal swab specimens and should not be used as a sole basis for treatment. Nasal washings and aspirates are unacceptable for Xpert Xpress SARS-CoV-2/FLU/RSV testing.  Fact Sheet for Patients: BloggerCourse.com  Fact Sheet for Healthcare Providers: SeriousBroker.it  This test is not yet approved or cleared by the [place] FDA and has been authorized for detection and/or diagnosis of SARS-CoV-2 by FDA under an Emergency Use Authorization (EUA). This EUA will remain in effect (meaning this test can be used) for the duration of the COVID-19 declaration under Section 564(b)(1) of the Act, 21 U.S.C. section 360bbb-3(b)(1), unless the authorization is terminated or revoked.  Performed at [facility], [address] [place], [place] [address]   Urine culture     Status: Abnormal   Collection Time: 08/02/20  2:00 PM   Specimen: Urine, Random  Result Value Ref Range Status   Specimen Description URINE, RANDOM  Final    Special Requests   Final    NONE Performed at [facility], [address], [place], [place] [address]    Culture >=100,000 COLONIES/mL ESCHERICHIA COLI (A)  Final   Report Status 08/04/2020 FINAL  Final   Organism ID, Bacteria ESCHERICHIA COLI (A)  Final      Susceptibility   Escherichia coli - MIC*    AMPICILLIN <=2 SENSITIVE Sensitive     CEFAZOLIN <=4 SENSITIVE Sensitive     CEFEPIME <=0.12 SENSITIVE Sensitive     CEFTRIAXONE <=0.25 SENSITIVE Sensitive     CIPROFLOXACIN <=0.25 SENSITIVE Sensitive     GENTAMICIN <=1 SENSITIVE Sensitive     IMIPENEM <=0.25 SENSITIVE Sensitive     NITROFURANTOIN <=16 SENSITIVE Sensitive     TRIMETH/SULFA <=20 SENSITIVE Sensitive     AMPICILLIN/SULBACTAM <=2 SENSITIVE Sensitive     PIP/TAZO <=4 SENSITIVE Sensitive     * >=100,000 COLONIES/mL ESCHERICHIA COLI  Culture, blood (routine x 2)     Status: None (Preliminary result)   Collection Time: 08/02/20 11:20 PM   Specimen: BLOOD RIGHT HAND  Result Value Ref Range Status   Specimen Description BLOOD RIGHT HAND  Final   Special Requests   Final    BOTTLES DRAWN AEROBIC AND ANAEROBIC Blood Culture adequate volume   Culture   Final    NO GROWTH 3 DAYS Performed at [facility], [address], [place], [place] [address]    Report Status PENDING  Incomplete  Culture, blood (routine x 2)     Status: None (Preliminary result)   Collection Time: 08/02/20 11:35 PM   Specimen: BLOOD  Result Value Ref Range Status   Specimen Description BLOOD RT THUMB  Final   Special Requests AEROBIC BOTTLE ONLY Blood Culture adequate volume  Final   Culture   Final    NO GROWTH 3 DAYS Performed at [facility], [address], [place], [place] [address]    Report Status PENDING  Incomplete     Radiology Studies: No results found.  Scheduled Meds: .  amiodarone  200 mg Oral BID  . Cenobamate  100 mg Oral Daily  . cholecalciferol  1,000 Units Oral Daily  . escitalopram  10 mg Oral Daily  .  gabapentin  200 mg Oral QHS  . lacosamide  200 mg Oral BID  . multivitamin with minerals  1 tablet Oral Daily  . phenytoin  100 mg Oral Daily   And  . phenytoin  200 mg Oral QHS  . senna-docusate  2 tablet Oral QHS  . sodium chloride flush  10-40 mL Intracatheter Q12H  . vitamin B-12  1,000 mcg Oral Daily   Continuous Infusions:    LOS: 4 days   Marylu Lund, MD [facility] Pager On Amion  If 7PM-7AM, please contact night-coverage 08/06/2020, 3:11 PM

## 2020-08-06 NOTE — PMR Pre-admission (Shared)
PMR Admission Coordinator Pre-Admission Assessment  Patient: [name] is an 65 y.o., female MRN: [id number] DOB: May 02, 1956 Height: 5\' 7"  (170.2 cm) Weight: 63 kg  Insurance Information HMO: ***    PPO: ***     PCP:      IPA:      80/20:      OTHER:  PRIMARY: [employer] Medicare      Policy#: [id number]      Subscriber: patient CM Name: ***      Phone#: ***     Fax#: *** Pre-Cert#: ***      Employer: *** Benefits:  Phone #: ***     Name: *** Dolores Hoose. Date: ***     Deduct: ***      Out of Pocket Max: ***      Life Max: *** CIR: ***      SNF: *** Outpatient: ***     Co-Pay: *** Home Health: ***      Co-Pay: *** DME: ***     Co-Pay: *** Providers: in-network SECONDARY:       Policy#:      Phone#:   Financial Counselor:       Phone#:   The Data processing manager" for patients in Inpatient Rehabilitation Facilities with attached "Privacy Act Statement-Health Care Records" was provided and verbally reviewed with: {CHL IP Patient Family TG:[id number]}  Emergency Contact Information Contact Information    Name Relation Home Work Mobile   Bouvier,[name] Daughter   [phone]   Chardai, Overcash   [phone]      Current Medical History  Patient Admitting Diagnosis: C2 fracture, Left hand/finger fx History of Present Illness: ***    Patient's medical record from [facility] has been reviewed by the rehabilitation admission coordinator and physician.  Past Medical History  Past Medical History:  Diagnosis Date  . Asthma   . Cerebral hemorrhage (HCC) 1989  . Chronic back pain   . COPD (chronic obstructive pulmonary disease) (HCC)   . DDD (degenerative disc disease) 06/12/2013  . DVT (deep venous thrombosis) (HCC)    "she's had several since 1990"  . GERD (gastroesophageal reflux disease)   . Headache    "usually around time when she's had a seizure"  . History of blood transfusion    "when she was a baby"  . History of stomach ulcers   . Kidney stones   .  Neuropathy   . Seizures (HCC)    "because of her brain surgery"  . Stroke [facility]) [phone]   family denies residual on 11/09/2014  . Tunnel vision    "since brain OR"    Family History   family history includes Cancer in her brother and mother; Heart attack in her father and mother.  Prior Rehab/Hospitalizations Has the patient had prior rehab or hospitalizations prior to admission? Yes  Has the patient had major surgery during 100 days prior to admission? No   Current Medications  Current Facility-Administered Medications:  .  acetaminophen (TYLENOL) tablet 650 mg, 650 mg, Oral, Q6H PRN, Opyd, Lavone Neri, MD, 650 mg at 08/06/20 1056 .  albuterol (VENTOLIN HFA) 108 (90 Base) MCG/ACT inhaler 1 puff, 1 puff, Inhalation, Q4H PRN, Hall, Carole N, DO .  amiodarone (PACERONE) tablet 200 mg, 200 mg, Oral, BID, Marjie Skiff E, PA-C, 200 mg at 08/06/20 1236 .  Cenobamate TABS 100 mg, 100 mg, Oral, Daily, Dow Adolph N, DO, 100 mg at 08/06/20 0929 .  cholecalciferol (VITAMIN D3) tablet 1,000 Units, 1,000 Units,  Oral, Daily, Kayleen Memos, [place], 1,000 Units at 08/06/20 [id number] .  escitalopram (LEXAPRO) tablet 10 mg, 10 mg, Oral, Daily, Irene Pap N, DO, 10 mg at 08/06/20 [id number] .  gabapentin (NEURONTIN) capsule 200 mg, 200 mg, Oral, QHS, Hall, Carole N, DO, 200 mg at 08/05/20 2142 .  HYDROmorphone (DILAUDID) injection 1 mg, 1 mg, Intravenous, Q4H PRN, Irene Pap N, DO, 1 mg at 08/05/20 2020 .  lacosamide (VIMPAT) tablet 200 mg, 200 mg, Oral, BID, Irene Pap N, DO, 200 mg at 08/06/20 [id number] .  loperamide (IMODIUM) capsule 2 mg, 2 mg, Oral, PRN, Donne Hazel, MD, 2 mg at 08/04/20 1617 .  meclizine (ANTIVERT) tablet 25 mg, 25 mg, Oral, TID PRN, Donne Hazel, MD .  multivitamin with minerals tablet 1 tablet, 1 tablet, Oral, Daily, Kayleen Memos, DO, 1 tablet at 08/06/20 [id number] .  oxyCODONE (Oxy IR/ROXICODONE) immediate release tablet 5 mg, 5 mg, Oral, Q3H PRN, Irene Pap N, DO, 5 mg at 08/05/20  2025 .  phenytoin (DILANTIN) ER capsule 100 mg, 100 mg, Oral, Daily, 100 mg at 08/06/20 [id number] **AND** phenytoin (DILANTIN) ER capsule 200 mg, 200 mg, Oral, QHS, Hall, Carole N, DO, 200 mg at 08/05/20 2144 .  senna-docusate (Senokot-S) tablet 2 tablet, 2 tablet, Oral, QHS, HallLorenda Cahill, DO, 2 tablet at 08/02/20 2203 .  sodium chloride flush (NS) 0.9 % injection 10-40 mL, 10-40 mL, Intracatheter, Q12H, Donne Hazel, MD, 10 mL at 08/04/20 0952 .  sodium chloride flush (NS) 0.9 % injection 10-40 mL, 10-40 mL, Intracatheter, PRN, Donne Hazel, MD .  vitamin B-12 (CYANOCOBALAMIN) tablet 1,000 mcg, 1,000 mcg, Oral, Daily, Irene Pap N, DO, 1,000 mcg at 08/06/20 [id number]  Patients Current Diet:  Diet Order            Diet regular Room service appropriate? Yes with Assist; Fluid consistency: Thin  Diet effective now                 Precautions / Restrictions Precautions Precautions: Fall,Cervical Precaution Comments: Reviewed cervical precautions Cervical Brace: Hard collar,At all times Restrictions Weight Bearing Restrictions: Yes LUE Weight Bearing: Non weight bearing Other Position/Activity Restrictions: "minimize weight bearing to what she can toleate" per Dr Wyline Copas   Has the patient had 2 or more falls or a fall with injury in the past year? Yes  Prior Activity Level Limited Community (1-2x/wk): goes places on weekends  Prior Functional Level Self Care: Did the patient need help bathing, dressing, using the toilet or eating? Independent  Indoor Mobility: Did the patient need assistance with walking from room to room (with or without device)? Independent  Stairs: Did the patient need assistance with internal or external stairs (with or without device)? Independent  Functional Cognition: Did the patient need help planning regular tasks such as shopping or remembering to take medications? Independent  Home Assistive Devices / Equipment Home Equipment: Walker - 2 wheels  Prior  Device Use: Indicate devices/aids used by the patient prior to current illness, exacerbation or injury? None of the above  Current Functional Level Cognition  Overall Cognitive Status: History of cognitive impairments - at baseline Orientation Level: Oriented X4 General Comments: PMH of CVA    Extremity Assessment (includes Sensation/Coordination)  Upper Extremity Assessment: RUE deficits/detail,LUE deficits/detail RUE Deficits / Details: Significant edema. RN reporting possible infiltration of IV. Pain with elbow movement and touch. Able to perform AROM of hand, wrist, elbow, and shoulder. RUE Sensation: WNL RUE Coordination: decreased fine  motor,decreased gross motor LUE Deficits / Details: Significant edema. RN reporting possible infiltration of IV. Pain with elbow movement and touch. Able to perform AROM of hand, wrist, elbow, and shoulder. Cast removed due to infiltration LUE Coordination: decreased fine motor,decreased gross motor  Lower Extremity Assessment: Defer to PT evaluation    ADLs  Overall ADL's : Needs assistance/impaired Eating/Feeding: Set up,Sitting Eating/Feeding Details (indicate cue type and reason): Pt drinking coffee once in recliner Grooming: Wash/dry face,Wash/dry hands,Set up,Sitting Upper Body Bathing: Moderate assistance,Bed level Lower Body Bathing: Maximal assistance,Bed level Upper Body Dressing : Bed level,Moderate assistance Lower Body Dressing: Min guard,Bed level Lower Body Dressing Details (indicate cue type and reason): Use of figure four method for donning socks at bed level. Difficulty with FM coorindation due to edema in hands; pooor grasp and pinch Toilet Transfer: Minimal assistance,Stand-pivot,RW Toilet Transfer Details (indicate cue type and reason): Min A for balance Toileting- Clothing Manipulation and Hygiene: Maximal assistance,Bed level Functional mobility during ADLs: Minimal assistance,Rolling walker (stand pivot) General ADL  Comments: Pt continues to present with decreased balance, coorindation, strength, and activity toelrance    Mobility  Overal bed mobility: Needs Assistance Bed Mobility: Rolling,Sidelying to Sit Rolling: Min assist Sidelying to sit: Min assist General bed mobility comments: Min A for rolling to right and then Min A for power up into sitting. Use of log roll and requiring increased cues    Transfers  Overall transfer level: Needs assistance Equipment used: Rolling walker (2 wheeled) Transfers: Sit to/from [employer] Sit to Stand: Min assist Stand pivot transfers: Max assist General transfer comment: Min A for power up and then to maintain balance during pivot to recliner    Ambulation / Gait / Stairs / Wheelchair Mobility  Ambulation/Gait General Gait Details: Unable this date    Posture / Balance Dynamic Sitting Balance Sitting balance - Comments: needed at least right UE support for balance Balance Overall balance assessment: Needs assistance Sitting-balance support: No upper extremity supported,Feet supported Sitting balance-Leahy Scale: Poor Sitting balance - Comments: needed at least right UE support for balance Standing balance support: Bilateral upper extremity supported,During functional activity Standing balance-Leahy Scale: Poor Standing balance comment: Reliant on UE support    Special needs/care consideration Skin Abrasion: arm, right/left; Cellulitis: arm, right/left, Bladder incontinence, external urinary catheter and Designated visitor Genelle Gather, daughter   Previous Surveyor, minerals (from acute therapy documentation) Living Arrangements: Children,Other relatives  Lives With: Family Available Help at Discharge: Family,Available 24 hours/day Type of Home: House Home Layout: Two level,1/2 bath on main level Alternate Level Stairs-Rails: Right Alternate Level Stairs-Number of Steps: 15 (chair lift present) Home Access: Level entry Entrance  Stairs-Rails: Right Entrance Stairs-Number of Steps: 7 Bathroom Shower/Tub: Engineer, manufacturing systems: Standard Bathroom Accessibility: Yes How Accessible: Accessible via walker Home Care Services: Yes Type of Home Care Services: Other (Comment) (nursing) Additional Comments: Pt reports she lives with her daughter and her daughter's family  Discharge Living Setting Plans for Discharge Living Setting: Patient's home Type of Home at Discharge: House Discharge Home Layout: Two level,1/2 bath on main level Alternate Level Stairs-Rails: Right Alternate Level Stairs-Number of Steps: 15 Discharge Home Access: Level entry Discharge Bathroom Shower/Tub: Tub/shower unit Discharge Bathroom Toilet: Standard Discharge Bathroom Accessibility: Yes How Accessible: Accessible via walker Does the patient have any problems obtaining your medications?: No  Social/Family/Support Systems Patient Roles: Parent,Other (Comment) (grandparent) Anticipated Caregiver: Shirrell Solinger, daughter Anticipated Caregiver's Contact Information: [phone] Caregiver Availability: 24/7 Discharge Plan Discussed with Primary Caregiver: Yes  Is Caregiver In Agreement with Plan?: Yes  Goals Pt/Family Agrees to Admission and willing to participate: Yes Program Orientation Provided & Reviewed with Pt/Caregiver Including Roles  & Responsibilities: Yes  Decrease burden of Care through IP rehab admission: NA  Possible need for SNF placement upon discharge: NA  Patient Condition: {PATIENT'S CONDITION:22832}  Preadmission Screen Completed By:  Bethel Born, 08/06/2020 5:37 PM ______________________________________________________________________   Discussed status with Dr. Marland Kitchen on *** at *** and received approval for admission today.  Admission Coordinator:  Bethel Born, CCC-SLP, time ***Sudie Grumbling ***   Assessment/Plan: Diagnosis: 1. Does the need for close, 24 hr/day Medical supervision in concert  with the patient's rehab needs make it unreasonable for this patient to be served in a less intensive setting? {yes_no_potentially:3041433} 2. Co-Morbidities requiring supervision/potential complications: *** 3. Due to {due WC:4653188, does the patient require 24 hr/day rehab nursing? {yes_no_potentially:3041433} 4. Does the patient require coordinated care of a physician, rehab nurse, PT, OT, and SLP to address physical and functional deficits in the context of the above medical diagnosis(es)? {yes_no_potentially:3041433} Addressing deficits in the following areas: {deficits:3041436} 5. Can the patient actively participate in an intensive therapy program of at least 3 hrs of therapy 5 days a week? {yes_no_potentially:3041433} 6. The potential for patient to make measurable gains while on inpatient rehab is {potential:3041437} 7. Anticipated functional outcomes upon discharge from inpatient rehab: {functional outcomes:304600100} PT, {functional outcomes:304600100} OT, {functional outcomes:304600100} SLP 8. Estimated rehab length of stay to reach the above functional goals is: *** 9. Anticipated discharge destination: {anticipated dc setting:21604} 10. Overall Rehab/Functional Prognosis: {potential:3041437}   MD Signature: ***

## 2020-08-06 NOTE — Progress Notes (Addendum)
Physical Therapy Treatment Patient Details Name: [name] MRN: [id number] DOB: 02/16/1956 Today's Date: 08/06/2020    History of Present Illness 65 y.o. female with fall resulting in C2 fracture and has left hand/finger fracture with splint in place.  Pt with new onset atrial flutter with rapid ventricular response as well as has UTI.  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    PT Comments    Pt admitted with above diagnosis. Pt assessed for BPPV and did have nystagmus.  Difficult assessment due to C collar but used bed and treated for right posterior canal BPPV.  Noted bed wet when assessing pt, therefore cleaned pt and changed linens positioning pt well on departure. She stated she felt less dizzy at end of treatment.  Pt currently with functional limitations due to balance and endurance deficits. Pt will benefit from skilled PT to increase their independence and safety with mobility to allow discharge to the venue listed below.     Follow Up Recommendations  CIR;Supervision/Assistance - 24 hour     Equipment Recommendations  3in1 (PT)    Recommendations for Other Services       Precautions / Restrictions Precautions Precautions: Fall;Cervical Required Braces or Orthoses: Cervical Brace;Splint/Cast removed Cervical Brace: Hard collar;At all times Splint/Cast: left UE (No weight bearing restrictions in chart, assumed NWB left UE until clarified) Restrictions Other Position/Activity Restrictions: Need clarification    Mobility  Bed Mobility Overal bed mobility: Needs Assistance Bed Mobility: Rolling Rolling: Mod assist Sidelying to sit: Mod assist       General bed mobility comments: Assesssed for vertigo first. Pt does  have nystagmus and was difficult to assess due to pt with C collar.  Used bed to try and assess and modified the canalith repositioning to get the positioning needed by rolling on side to get positions with bed in trendelenberg treating for right posterior canal BPPV.  Noted while rolling that pt was wet with urine. She states she had let nursing know.  Mod assist for LES off bed and for elevation of trunk with incr time and pt shaky needing more assist.  once pt on EOB, obtained soap and water and bathed pt, changed all her sheets and changed her gown.  Pt appreciative once she laid down with mod assist.  Transfers                 General transfer comment: Did not attempt as pt was fatigued after PT cleaned pt and changed all bed linens.  Ambulation/Gait             General Gait Details: Unable this date   Stairs             Wheelchair Mobility    Modified Rankin (Stroke Patients Only)       Balance Overall balance assessment: Needs assistance Sitting-balance support: No upper extremity supported;Feet supported Sitting balance-Leahy Scale: Poor Sitting balance - Comments: needed at least right UE support for balance                                    Cognition Arousal/Alertness: Awake/alert Behavior During Therapy: WFL for tasks assessed/performed Overall Cognitive Status: History of cognitive impairments - at baseline  Exercises General Exercises - Lower Extremity Long Arc Quad: AROM;Both;5 reps;Seated    General Comments General comments (skin integrity, edema, etc.): VSS      Pertinent Vitals/Pain Pain Assessment: Faces Faces Pain Scale: Hurts even more Pain Location: ribs, left UE and neck Pain Descriptors / Indicators: Aching;Grimacing;Guarding Pain Intervention(s): Limited activity within patient's tolerance;Monitored during session;Repositioned    Home Living                       Prior Function            PT Goals (current goals can now be found in the care plan section) Acute Rehab PT Goals Patient Stated Goal: Be comfortable moving around again. Progress towards PT goals: Progressing toward goals    Frequency    Min 3X/week      PT Plan Current plan remains appropriate    Co-evaluation              AM-PAC PT "6 Clicks" Mobility   Outcome Measure  Help needed turning from your back to your side while in a flat bed without using bedrails?: A Lot Help needed moving from lying on your back to sitting on the side of a flat bed without using bedrails?: A Lot Help needed moving to and from a bed to a chair (including a wheelchair)?: Total Help needed standing up from a chair using your arms (e.g., wheelchair or bedside chair)?: Total Help needed to walk in hospital room?: Total Help needed climbing 3-5 steps with a railing? : Total 6 Click Score: 8    End of Session Equipment Utilized During Treatment: Gait belt Activity Tolerance: Patient limited by fatigue;Patient limited by pain (limited by dizziness) Patient left: in bed;with call bell/phone within reach;with bed alarm set Nurse Communication: Mobility status PT Visit Diagnosis: Unsteadiness on feet (R26.81);Muscle weakness (generalized) (M62.81);Dizziness and giddiness (R42);Pain Pain - Right/Left: Left Pain - part of body: Arm (ribs per pt, neck)     Time: [id number] PT Time Calculation (min) (ACUTE ONLY): 42 min  Charges:  $Therapeutic Activity: 8-22 mins $Self Care/Home Management: 8-22 $Canalith Rep Proc: 8-22 mins                     Tsering Leaman W,PT Acute Rehabilitation Services Pager:  [phone]  Office:  [place] 08/06/2020, 11:40 AM

## 2020-08-06 NOTE — Progress Notes (Signed)
Occupational Therapy Treatment Patient Details Name: [name] MRN: [id number] DOB: [birth year] Today's Date: 08/06/2020    History of present illness 65 y.o. female with fall resulting in C2 fracture and has left hand/finger fracture with splint in place.  Pt with new onset atrial flutter with rapid ventricular response as well as has UTI.  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   OT comments  Pt progressing towards established OT goals. Pt presenting with significant edema at BUEs; also red coloring and warm to touch. RN reporting IVs infiltrated. Providing education figure four method for donning LB clothing. Pt donning socks at bed level using figure four method and Min cues. Pt performing log roll with Min A and reporting mild dizziness once at EOB. Pt performing stand pivot to recliner with Min A and RW. Continue to recommend dc to CIR and will continue to follow acutely as admitted.   Soft BP: supine 96/47 (62), sitting EOB 109/45 (61), and sitting in recliner 97/68 (76). Notified RN.  HR 80s. SpO2 90s on RA.   Follow Up Recommendations  CIR    Equipment Recommendations  Tub/shower seat;3 in 1 bedside commode    Recommendations for Other Services      Precautions / Restrictions Precautions Precautions: Fall;Cervical Precaution Comments: Reviewed cervical precautions Required Braces or Orthoses: Cervical Brace Cervical Brace: Hard collar;At all times Splint/Cast: left UE ("minimize weight bearing to what she can toleate" per Dr Rhona Leavens) Restrictions Other Position/Activity Restrictions: "minimize weight bearing to what she can toleate" per Dr Rhona Leavens       Mobility Bed Mobility Overal bed mobility: Needs Assistance Bed Mobility:  Rolling;Sidelying to Sit Rolling: Min assist Sidelying to sit: Min assist       General bed mobility comments: Min A for rolling to right and then Min A for power up into sitting. Use of log roll and requiring increased cues  Transfers Overall transfer level: Needs assistance Equipment used: Rolling walker (2 wheeled) Transfers: Sit to/from [employer] Sit to Stand: Min assist Stand pivot transfers: Max assist       General transfer comment: Min A for power up and then to maintain balance during pivot to recliner    Balance Overall balance assessment: Needs assistance Sitting-balance support: No upper extremity supported;Feet supported Sitting balance-Leahy Scale: Poor Sitting balance - Comments: needed at least right UE support for balance   Standing balance support: Bilateral upper extremity supported;During functional activity Standing balance-Leahy Scale: Poor Standing balance comment: Reliant on UE support                           ADL either performed or assessed with clinical judgement   ADL Overall ADL's : Needs assistance/impaired Eating/Feeding: Set up;Sitting Eating/Feeding Details (indicate cue type and reason): Pt drinking coffee once in recliner                 Lower Body Dressing: Min guard;Bed level Lower Body Dressing Details (indicate cue type and reason): Use of figure four method for donning socks at bed level. Difficulty with FM coorindation due to edema in hands; pooor grasp and pinch Toilet Transfer: Minimal assistance;Stand-pivot;RW Toilet Transfer Details (indicate cue type and reason): Min A for balance         Functional  mobility during ADLs: Minimal assistance;Rolling walker (stand pivot) General ADL Comments: Pt continues to present with decreased balance, coorindation, strength, and activity toelrance     Vision       Perception     Praxis      Cognition Arousal/Alertness: Awake/alert Behavior  During Therapy: WFL for tasks assessed/performed Overall Cognitive Status: History of cognitive impairments - at baseline                                 General Comments: PMH of CVA        Exercises Exercises: General Lower Extremity General Exercises - Lower Extremity Ankle Circles/Pumps: AROM;Both;10 reps;Supine   Shoulder Instructions       General Comments Soft BP: supine 96/47 (62), sitting EOB 109/45 (61), and sitting in recliner 97/68 (76). HR 80s. Spo2 90s on RA.    Pertinent Vitals/ Pain       Pain Assessment: Faces Faces Pain Scale: Hurts even more Pain Location: "I hurt everywhere" Pain Descriptors / Indicators: Aching;Grimacing;Guarding Pain Intervention(s): Monitored during session;Limited activity within patient's tolerance;Repositioned  Home Living                                          Prior Functioning/Environment              Frequency  Min 2X/week        Progress Toward Goals  OT Goals(current goals can now be found in the care plan section)  Progress towards OT goals: Progressing toward goals  Acute Rehab OT Goals Patient Stated Goal: Be comfortable moving around again. OT Goal Formulation: With patient Time For Goal Achievement: 08/17/20 ADL Goals Pt Will Perform Grooming: with supervision;sitting;standing Pt Will Perform Upper Body Bathing: with supervision;sitting Pt Will Perform Lower Body Bathing: with supervision;sit to/from stand Pt Will Perform Upper Body Dressing: with supervision;sitting Pt Will Perform Lower Body Dressing: with supervision;sit to/from stand Pt Will Transfer to Toilet: with supervision;ambulating Pt Will Perform Toileting - Clothing Manipulation and hygiene: with supervision;sit to/from stand  Plan Discharge plan remains appropriate    Co-evaluation                 AM-PAC OT "6 Clicks" Daily Activity     Outcome Measure   Help from another person eating meals?: A  Little Help from another person taking care of personal grooming?: A Little Help from another person toileting, which includes using toliet, bedpan, or urinal?: A Little Help from another person bathing (including washing, rinsing, drying)?: A Little Help from another person to put on and taking off regular upper body clothing?: A Little Help from another person to put on and taking off regular lower body clothing?: A Little 6 Click Score: 18    End of Session Equipment Utilized During Treatment: Cervical collar;Rolling walker  OT Visit Diagnosis: Pain;Unsteadiness on feet (R26.81);Other abnormalities of gait and mobility (R26.89);Muscle weakness (generalized) (M62.81) Pain - Right/Left:  (BIlateral) Pain - part of body: Arm   Activity Tolerance Patient tolerated treatment well   Patient Left in chair;with call bell/phone within reach   Nurse Communication Mobility status        Time: [id number] OT Time Calculation (min): 29 min  Charges: OT General Charges $OT Visit: 1 Visit OT Treatments $Self Care/Home Management : 8-22 mins $Therapeutic Activity: 8-22 mins  [place], OTR/L Acute Rehab Pager: [phone] Office: [place] 08/06/2020, 4:35 PM

## 2020-08-06 NOTE — Progress Notes (Signed)
Inpatient Rehab Admissions Coordinator:  Spoke with pt's daughter, [name].  She confirmed appropriate disposition for pt to be considered an appropriate candidate for potential admission to CIR.  Will continue to follow.   Wolfgang [place], MS, CCC-SLP Admissions Coordinator [phone]

## 2020-08-06 NOTE — Progress Notes (Signed)
Progress Note  Patient Name: [name] Date of Encounter: 08/06/2020  [facility] Cardiologist: Dr. Rennis Golden  Subjective   Converted to sinus rhythm early this morning. Hypotensive overnight with systolic. BP as low as 80/42 requiring 1.5 mL of normal saline. Improved. Has bilateral upper extremity erythema at IV sites.  Inpatient Medications    Scheduled Meds: . Cenobamate  100 mg Oral Daily  . cholecalciferol  1,000 Units Oral Daily  . digoxin  0.125 mg Oral Daily  . escitalopram  10 mg Oral Daily  . gabapentin  200 mg Oral QHS  . lacosamide  200 mg Oral BID  . metoprolol tartrate  12.5 mg Oral BID  . multivitamin with minerals  1 tablet Oral Daily  . phenytoin  100 mg Oral Daily   And  . phenytoin  200 mg Oral QHS  . senna-docusate  2 tablet Oral QHS  . sodium chloride flush  10-40 mL Intracatheter Q12H  . vitamin B-12  1,000 mcg Oral Daily   Continuous Infusions: . sodium chloride 125 mL/hr at 08/06/20 0449  . amiodarone 30 mg/hr (08/06/20 1035)   PRN Meds: acetaminophen, albuterol, HYDROmorphone (DILAUDID) injection, loperamide, oxyCODONE, sodium chloride flush   Vital Signs    Vitals:   08/06/20 0112 08/06/20 0206 08/06/20 0341 08/06/20 0852  BP: (!) 80/42 (!) 85/54 (!) 96/48 (!) 93/50  Pulse: (!) 107 (!) 104 78 79  Resp: 16 20 20 19   Temp:  97.8 F (36.6 C) 98.2 F (36.8 C) 99.3 F (37.4 C)  TempSrc:  Oral Oral Oral  SpO2: 95% 98% 100% 99%  Weight:      Height:        Intake/Output Summary (Last 24 hours) at 08/06/2020 1105 Last data filed at 08/06/2020 0338 Gross per 24 hour  Intake 5324.02 ml  Output 300 ml  Net 5024.02 ml   Last 3 Weights 08/02/2020 06/06/2020 04/24/2020  Weight (lbs) 138 lb 14.2 oz 138 lb 130 lb  Weight (kg) 63 kg 62.596 kg 58.968 kg      Telemetry    Converted to sinus rhythm around 3:30am with rates in the 80's. - Personally Reviewed  ECG    No new ECG tracing today. - Personally Reviewed  Physical Exam   GEN:  No acute distress.   Neck: No JVD Cardiac: RRR, no murmurs, rubs, or gallops.  Respiratory: Clear to auscultation bilaterally. GI: Soft, nontender, non-distended  MS: No edema; No deformity. Erythema both forearms at IV sites Neuro:  Nonfocal  Psych: Normal affect   Labs    High Sensitivity Troponin:   Recent Labs  Lab 08/02/20 1137 08/02/20 1140 08/02/20 1935  TROPONINIHS 25* 24* 27*      Chemistry Recent Labs  Lab 08/03/20 0001 08/04/20 0703 08/05/20 0407 08/06/20 0053  NA 140 140 139 138  K 2.7* 3.6 3.6 4.1  CL 109 107 106 104  CO2 21* 23 26 23   GLUCOSE 112* 114* 115* 138*  BUN 8 <5* <5* <5*  CREATININE 0.74 0.57 0.65 0.73  CALCIUM 7.3* 7.7* 8.0* 7.7*  PROT 4.8*  --  4.7* 4.3*  ALBUMIN 2.1*  --  2.0* 1.8*  AST 23  --  20 26  ALT 16  --  16 18  ALKPHOS 46  --  40 45  BILITOT 0.5  --  0.4 0.5  GFRNONAA >60 >60 >60 >60  ANIONGAP 10 10 7 11      Hematology Recent Labs  Lab 08/02/20 1137 08/02/20  1205 08/03/20 0001 08/04/20 0703  WBC 8.3  --  6.3 4.8  RBC 3.83*  --  3.20* 3.29*  HGB 13.3 13.3 10.8* 10.9*  HCT 38.9 39.0 32.8* 33.9*  MCV 101.6*  --  102.5* 103.0*  MCH 34.7*  --  33.8 33.1  MCHC 34.2  --  32.9 32.2  RDW 11.9  --  11.9 12.0  PLT 156  --  110* 134*    BNPNo results for input(s): BNP, PROBNP in the last 168 hours.   DDimer No results for input(s): DDIMER in the last 168 hours.   Radiology    No results found.  Cardiac Studies   Echocardiogram 08/03/2020: Impression: 1. Left ventricular ejection fraction, by estimation, is 55 to 60%. The  left ventricle has normal function. The left ventricle has no regional  wall motion abnormalities. Left ventricular diastolic parameters are  indeterminate.  2. Right ventricular systolic function is normal. The right ventricular  size is normal. There is mildly elevated pulmonary artery systolic  pressure. The estimated right ventricular systolic pressure is [id number] mmHg.  3. The mitral valve  is normal in structure. No evidence of mitral valve  regurgitation. No evidence of mitral stenosis.  4. The aortic valve is tricuspid. Aortic valve regurgitation is not  visualized. No aortic stenosis is present.  5. The inferior vena cava is dilated in size with <50% respiratory  variability, suggesting right atrial pressure of 15 mmHg.   Patient Profile     65 y.o. 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 Cardiology consulted for evaluation of new onset atrial flutter with RVR.  Assessment & Plan    New Onset Atrial Flutter/Fibrillation - Converted back to sinus rhythm around 3:30am. Rates currently in the 80's. - Echo showed LVEF of 55-60% with normal wall motion and mildly elevated PASP. - Currently on IV Amiodarone. Will transition to PO Amiodarone 200mg  twice daily. - Will stop low dose Lopressor given hypotension. - Will stop Digoxin. - CHA2DS2-VASc = 1 (female). Not felt to be a good candidate for anticoagulation given history of frequent seizures and history of right occipital AVM/intracranial hemorrhage as well as recent C2 fracture.  Hypotension -Systolic BP [id number] this morning. Patien received 1.5 mL of normal saline. BP still soft but stable. - Will stop Lopressor 25mg  twice daily. - Continue to monitor closely.   Hypokalemia - Potassium 3.2 on admission and dropped to 2.7 on 12/31. Repleted and 4.1 today. - Magnesium 2.1.  - Continue to monitor.   Otherwise per primary team. 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  I have reviewed the chart, notes and new data.  I agree with PA/NP's note.  Key new complaints: No CV complaints Key examination changes: no signs of hypervolemia, RRR, C-collar Key new findings / data: K now normal. Aflutter on 12/31 ECG, now in NSR on telemetry.  PLAN: Arhythmia onset during acute illness and severe hypokalemia. Low cardioembolic risk (CHADSVasc 0-1, female gender only). High risk of intracranial bleeding (  history of previous ICH, cerbral AVM, fall). No anticoagulation. Stop IV amiodarone. If no recurrence of arrhythmia by time of hospital DC, will stop oral amiodarone as well.  Sanda Klein, MD, [place] [phone] 08/06/2020, 11:40 AM  08/06/2020, 11:05 AM

## 2020-08-06 NOTE — Progress Notes (Signed)
Patient's blood pressure was down to 80/42 this am.  Informed the on call physician who ordered a one time IV 500 cc bolus of normal saline.  After the bolus was given her blood pressure only went up to 85/54.  The patient stated she felt weak and dizzy.  Informed the physician again and he ordered a 1000 mL bolus and to stop the continuous fluids of normal saline.  Will continue to monitor.  Harriet Masson, RN

## 2020-08-06 NOTE — Progress Notes (Signed)
Patient up in chair at bedside. Notified Tori RN to re-consult when patient is back to bed. VU. Tomasita Morrow, RN VAST

## 2020-08-06 NOTE — Consult Note (Signed)
WOC Nurse Consult Note: Asked to evaluate arms for possible extravasation injury. Both arms are erythematous and edematous.  Tender to touch.  Left dorsal lower arm with scabbed lesion that had been bleeding and is reddened noted.  Antecubital space with 2x2 and tape in place but no extravasation injury noted. Patient has received potentially vesicant medication magnesium and potassium.  No open/concerning lesions noted. I will implement tpical orders for scabbed lesion to left arm    Reason for Consult: assess arms for infiltration injury Wound type: injury from vesicant medication  Vs cellulitis. Pressure Injury POA: NA Measurement: Left dorsal arm:  2 cm x 2 cm scabbed lesion. Surrounding induration and erythema.  Tender to touch.  Wound OFB:PZWCHEN.  Drainage (amount, consistency, odor) minimal serosanguinous  No odor.  Periwound:erythema and edema.  Dressing procedure/placement/frequency: Cleanse left arm with NS and pat dry. Apply Xeroform gauze. COver with dry gauze and kerlix/tape.  Change MOnday/Wed/Friday.  Will not follow at this time.  Please re-consult if needed.  [place]le [place] MSN, RN, FNP-BC CWON Wound, Ostomy, Continence Nurse Pager [phone]

## 2020-08-07 DIAGNOSIS — W19XXXA Unspecified fall, initial encounter: Secondary | ICD-10-CM | POA: Diagnosis not present

## 2020-08-07 DIAGNOSIS — S12100A Unspecified displaced fracture of second cervical vertebra, initial encounter for closed fracture: Secondary | ICD-10-CM | POA: Diagnosis not present

## 2020-08-07 DIAGNOSIS — I483 Typical atrial flutter: Secondary | ICD-10-CM | POA: Diagnosis not present

## 2020-08-07 DIAGNOSIS — S63259A Unspecified dislocation of unspecified finger, initial encounter: Secondary | ICD-10-CM | POA: Diagnosis not present

## 2020-08-07 LAB — PHENYTOIN LEVEL, TOTAL: Phenytoin Lvl: 4.9 ug/mL — ABNORMAL LOW (ref 10.0–20.0)

## 2020-08-07 LAB — COMPREHENSIVE METABOLIC PANEL
ALT: 18 U/L (ref 0–44)
AST: 22 U/L (ref 15–41)
Albumin: 2 g/dL — ABNORMAL LOW (ref 3.5–5.0)
Alkaline Phosphatase: 48 U/L (ref 38–126)
Anion gap: 9 (ref 5–15)
BUN: <5 mg/dL — ABNORMAL LOW (ref 8–23)
CO2: 26 mmol/L (ref 22–32)
Calcium: 8.3 mg/dL — ABNORMAL LOW (ref 8.9–10.3)
Chloride: 105 mmol/L (ref 98–111)
Creatinine, Ser: 0.67 mg/dL (ref 0.44–1.00)
GFR, Estimated: >60 mL/min (ref >60)
Glucose, Bld: 106 mg/dL — ABNORMAL HIGH (ref 70–99)
Potassium: 3.7 mmol/L (ref 3.5–5.1)
Sodium: 140 mmol/L (ref 135–145)
Total Bilirubin: 0.5 mg/dL (ref 0.3–1.2)
Total Protein: 5 g/dL — ABNORMAL LOW (ref 6.5–8.1)

## 2020-08-07 LAB — MAGNESIUM: Magnesium: 1.8 mg/dL (ref 1.7–2.4)

## 2020-08-07 LAB — RESP PANEL BY RT-PCR (FLU A&B, COVID) ARPGX2
Influenza A by PCR: NEGATIVE
Influenza B by PCR: NEGATIVE
SARS Coronavirus 2 by RT PCR: NEGATIVE

## 2020-08-07 MED ORDER — CEFAZOLIN SODIUM-DEXTROSE 1-4 GM/50ML-% IV SOLN
1.0000 g | Freq: Three times a day (TID) | INTRAVENOUS | Status: DC
  Administered 2020-08-07 – 2020-08-08 (×4): 1 g via INTRAVENOUS
  Filled 2020-08-07 (×9): qty 50

## 2020-08-07 NOTE — TOC CAGE-AID Note (Signed)
Transition of Care [facility]) - CAGE-AID Screening   Patient Details  Name: [name] MRN: [id number] Date of Birth: September 29, 1955   Clinical Narrative: Patient denies current alcohol or drug use.   CAGE-AID Screening:    Have You Ever Felt You Ought to Cut Down on Your Drinking or Drug Use?: No Have People Annoyed You By Critizing Your Drinking Or Drug Use?: No Have You Felt Bad Or Guilty About Your Drinking Or Drug Use?: No Have You Ever Had a Drink or Used Drugs First Thing In The Morning to Steady Your Nerves or to Get Rid of a Hangover?: No CAGE-AID Score: 0  Substance Abuse Education Offered: No

## 2020-08-07 NOTE — Progress Notes (Signed)
Phenytoin Follow-Up Consult Indication: Seizures  Allergies  Allergen Reactions  . Zonisamide Other (See Comments)    Numbness and tingling over whole body  . Chocolate Flavor Other (See Comments)    Trigger for Seizure  . Codeine Nausea And Vomiting  . Keppra [Levetiracetam] Other (See Comments)    Causes seizures  . Pregabalin Nausea And Vomiting  . Olive Oil Rash    Patient Measurements: Height: 5\' 7"  (170.2 cm) Weight: 63 kg (138 lb 14.2 oz) IBW/kg (Calculated) : 61.6 TPN AdjBW (KG): 63 Body mass index is 21.75 kg/m.   Vital signs: Temp: 98.3 F (36.8 C) (01/04 0453) Temp Source: Oral (01/04 0453) BP: 114/54 (01/04 0453) Pulse Rate: 80 (01/04 0453)  Labs: Lab Results  Component Value Date/Time   Albumin 2.0 (L) 08/07/2020 0228   Phenytoin Lvl 4.9 (L) 08/07/2020 0228   Lab Results  Component Value Date   PHENYTOIN 4.9 (L) 08/07/2020   VALPROATE 28.4 (L) 06/18/2013   Estimated Creatinine Clearance: 70.9 mL/min (by C-G formula based on SCr of 0.67 mg/dL).   Medications:  Scheduled:  . amiodarone  200 mg Oral BID  . Cenobamate  100 mg Oral Daily  . cholecalciferol  1,000 Units Oral Daily  . escitalopram  10 mg Oral Daily  . gabapentin  200 mg Oral QHS  . lacosamide  200 mg Oral BID  . multivitamin with minerals  1 tablet Oral Daily  . phenytoin  100 mg Oral Daily   And  . phenytoin  200 mg Oral QHS  . senna-docusate  2 tablet Oral QHS  . sodium chloride flush  10-40 mL Intracatheter Q12H  . vitamin B-12  1,000 mcg Oral Daily    Assessment: Corrected phenytoin level (if needed): 9.8 Seizure activity: none Significant potential drug interactions: n/a  Goals of care:  Total phenytoin level: 10-20 mcg/ml Free phenytoin level: 1-2 mcg/ml  Plan:  It appears there may have been confusion about prescribed regimen PTA.  Home dose of 100 mg in the AM and 200 mg in QHS was resumed on admission, and corrected level now almost at goal.   From outpatient  neurology note, it looks they are following levels closely.  Will not increase dosing at this time.  06/20/2013, Reece Leader, BCCP Clinical Pharmacist  08/07/2020 8:06 AM   [facility] pharmacy phone numbers are listed on [email or web address]

## 2020-08-07 NOTE — Progress Notes (Addendum)
Progress Note  Patient Name: [name] Date of Encounter: 08/07/2020  [facility] Cardiologist: Dr. Debara Pickett  Subjective   No CP or palpitations. Both arms hurt very badly, R>L, and have edema R>L Denies SOB  Inpatient Medications    Scheduled Meds: . amiodarone  200 mg Oral BID  . Cenobamate  100 mg Oral Daily  . cholecalciferol  1,000 Units Oral Daily  . escitalopram  10 mg Oral Daily  . gabapentin  200 mg Oral QHS  . lacosamide  200 mg Oral BID  . multivitamin with minerals  1 tablet Oral Daily  . phenytoin  100 mg Oral Daily   And  . phenytoin  200 mg Oral QHS  . senna-docusate  2 tablet Oral QHS  . sodium chloride flush  10-40 mL Intracatheter Q12H  . vitamin B-12  1,000 mcg Oral Daily   Continuous Infusions:  PRN Meds: acetaminophen, albuterol, HYDROmorphone (DILAUDID) injection, loperamide, meclizine, oxyCODONE, sodium chloride flush   Vital Signs    Vitals:   08/06/20 2100 08/06/20 2354 08/07/20 0453 08/07/20 0809  BP: (!) 121/56 (!) 111/54 (!) 114/54 (!) 112/55  Pulse: 95 95 80 85  Resp: 19 20 20 19   Temp: (!) 101.3 F (38.5 C) (!) 101.3 F (38.5 C) 98.3 F (36.8 C) 98.5 F (36.9 C)  TempSrc: Oral Oral Oral Oral  SpO2: 91% 98% 98% 91%  Weight:      Height:        Intake/Output Summary (Last 24 hours) at 08/07/2020 1112 Last data filed at 08/07/2020 0855 Gross per 24 hour  Intake 960 ml  Output 900 ml  Net 60 ml   Last 3 Weights 08/02/2020 06/06/2020 04/24/2020  Weight (lbs) 138 lb 14.2 oz 138 lb 130 lb  Weight (kg) 63 kg 62.596 kg 58.968 kg      Telemetry    Maintaining SR, no sig ectopy - Personally Reviewed  ECG    None today. - Personally Reviewed  Physical Exam   GEN: + acute distress 2nd pain in arms Neck: No JVD seen, difficult to assess 2nd C collar Cardiac: RRR, no murmur, no rubs, or gallops.  Respiratory: diminished to auscultation bilaterally  GI: Soft, nontender, non-distended  MS: R>L UE edema; No deformity. No LE  edema. R arm has erythema and ?old IV site that may have infection, L arm w/ erythema  Neuro:  Nonfocal  Psych: Normal affect   Labs    High Sensitivity Troponin:   Recent Labs  Lab 08/02/20 1137 08/02/20 1140 08/02/20 1935  TROPONINIHS 25* 24* 27*      Chemistry Recent Labs  Lab 08/05/20 0407 08/06/20 0053 08/07/20 0228  NA 139 138 140  K 3.6 4.1 3.7  CL 106 104 105  CO2 26 23 26   GLUCOSE 115* 138* 106*  BUN <5* <5* <5*  CREATININE 0.65 0.73 0.67  CALCIUM 8.0* 7.7* 8.3*  PROT 4.7* 4.3* 5.0*  ALBUMIN 2.0* 1.8* 2.0*  AST 20 26 22   ALT 16 18 18   ALKPHOS 40 45 48  BILITOT 0.4 0.5 0.5  GFRNONAA >60 >60 >60  ANIONGAP 7 11 9      Hematology Recent Labs  Lab 08/02/20 1137 08/02/20 1205 08/03/20 0001 08/04/20 0703  WBC 8.3  --  6.3 4.8  RBC 3.83*  --  3.20* 3.29*  HGB 13.3 13.3 10.8* 10.9*  HCT 38.9 39.0 32.8* 33.9*  MCV 101.6*  --  102.5* 103.0*  MCH 34.7*  --  33.8 33.1  MCHC 34.2  --  32.9 32.2  RDW 11.9  --  11.9 12.0  PLT 156  --  110* 134*    BNPNo results for input(s): BNP, PROBNP in the last 168 hours.   DDimer No results for input(s): DDIMER in the last 168 hours.   Radiology    No results found.  Cardiac Studies   Echocardiogram 08/03/2020: Impression: 1. Left ventricular ejection fraction, by estimation, is 55 to 60%. The  left ventricle has normal function. The left ventricle has no regional  wall motion abnormalities. Left ventricular diastolic parameters are  indeterminate.  2. Right ventricular systolic function is normal. The right ventricular  size is normal. There is mildly elevated pulmonary artery systolic  pressure. The estimated right ventricular systolic pressure is 38.8 mmHg.  3. The mitral valve is normal in structure. No evidence of mitral valve  regurgitation. No evidence of mitral stenosis.  4. The aortic valve is tricuspid. Aortic valve regurgitation is not  visualized. No aortic stenosis is present.  5. The  inferior vena cava is dilated in size with <50% respiratory  variability, suggesting right atrial pressure of 15 mmHg.   Patient Profile     65 y.o. 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 Cardiology consulted for evaluation of new onset atrial flutter with RVR.  Assessment & Plan    New Onset Atrial Flutter/Fibrillation - maintaining SR on amio - nl EF on echo, both atria normal in size - continue po amio at 200 mg qd x 2 weeks, then 200 mg qd - CHA2DS2-VASc = 1 for female -  hx ICH and has AVM, at this time risk of anticoagulation outweighs benefit   Hypotension - BB stopped and hypotension improved - HR 70s-90s, SBP 100s-120s - follow  Hypokalemia - nadir 2.7 s/p supplement and improved - continue to follow - Mg 1.8  Otherwise per primary team. 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  I have reviewed the chart, notes and new data.  I agree with PA/NP's note.  Key new complaints: arms are uncomfortable, no CV complaints Key examination changes: normal CV exam; bilateral forearm phlebitis/cellulitis changes. BP much better today, although DBP still in 50s. Key new findings / data: maintaining NSR on telemetry  PLAN: Onset of paroxysmal atrial fibrillation (new) during acute illness/hyperadrenegic state and major electrolyte abnormalities. Low embolic risk and high risk of serious bleeding complications. No anticoagulation. If she remains arrhythmia  free by time of DC, will stop the oral amiodarone as well.  Thurmon Fair, MD, [facility] [phone] 08/07/2020, 3:38 PM

## 2020-08-07 NOTE — Progress Notes (Signed)
Inpatient Rehab Admissions Coordinator:   Opened insurance for prior authorization this afternoon.  Would expect at least 48 hours before response.  Also note pt exposed to covid over the weekend and symptomatic.  Will follow for test results, as she would need to remain isolated for a period of time if positive.    Estill Dooms, PT, DPT Admissions Coordinator [phone] 08/07/20  6:02 PM

## 2020-08-07 NOTE — Progress Notes (Signed)
PROGRESS NOTE    [name]  [id number] DOB: 1956/01/03 DOA: 08/02/2020 PCP: [name], MD    Brief Narrative:  65 y.o. 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 ED after a fall at home.  Associated with immediate left hand pain, neck pain and occipital headache.  History is obtained from the patient however it is limited due to pain from her fractures.  She does not know whether or not she tripped and fell but she thinks she passed out.  Not postictal.  She is alert and oriented x3.  Reports prior to this event she was having dysuria for a few days.  Denies any fevers or chills.  No nausea or abdominal pain.  No prior cardiopulmonary symptoms.  She had another fall about a week ago and does not remember the circumstances around the fall.  Denies any prior history of A. fib.  Work-up in the ED revealed new onset A. fib with RVR, urine analysis positive for pyuria, C2 lateral mass fracture, left hand/finger fracture, possible left pyelonephritis on CT scan.  She was seen by neurosurgery, recommended conservative management, to wear a c-collar at all times.  She now has a left upper extremity splint in place.  Cardiology was consulted to assist with new onset A. fib with RVR.  TRH was asked to admit for further work-up and management of present condition.  Assessment & Plan:   Active Problems:   Fall  Fall, unclear etiology -On further questioning this AM, pt reports remembering events of falling and states "missing a step" up a staircase. -2d echo performed, findings of normal LVEF -Therapy recs for CIR when stable -Per therapy, concerns of vertigo on ambulation with PT,  meclizine ordered for symptom relief  New onset rapid aflutter No prior history of A. Fib Remains tachycardic Was continued on Cardizem drip, however was later d/c secondary to hypotension 2d echo per above Cardiology following. Had been on amiodarone IV with metoprolol and dig later added Overnight, pt converted to sinus Now on PO amiodarone Recheck bmet in AM  Elevated troponin likely demand ischemia in the setting of A. fib with RVR Troponin peaked at 25 and trended down Suspect demand ischemia related to rapid heart rate 2d echo as per above Cardiology continues to follow  Pan-sensitive ecoli UTI with suspected pyelonephritis on CT scan History of ESBL E. coli in 2019 Presented with urine analysis positive for pyuria Obtain urine culture pos for pan-sensitive ecoli Initially started on meropenem, now on rocephin No leukocytosis Completed course of abx for UTI  C2 left lateral mass fracture post fall Seen by neurosurgery Recommended treatment with aspen c-collar at all times Cont with analgesia as needed  Left hand/finger fracture post fall She currently has a splint in place Continue with analgesia with bowel regimen  Prolonged QTC Presented with QTC of 545 Avoid QTC prolonging agents Anticipate keeping K>4 and Mg>2  Seizure disorder Subtherapeutic level of Dilantin, 2.9 Not postictal at the time of this visit Resume home regimen, she is on several seizure medications Pharmacy assisting with dosage Seizure precautions. Cont to be seizure free  Polyneuropathy Resume home Neurontin  Chronic anxiety/depression Stable She is on escitalopram  Hypokalemia Cont to correct Repeat lytes in AM  Hyperglycemia likely stress-induced in the setting of fractures Hgb A1c 5.0 Cont to monitor for now  Prior tobacco abuse history -Reported long hx of prior tobacco abuse -Pt elected to quit smoking 4 years ago and remains tobacco free -Pt  congratulated  B arm pain and swelling -marked tenderness and edema in B arms -Staff reports IV infiltrated recently while pt was receiving IV amio, thus suspect drug reaction -Pt noted to be febrile overnight -Have started empiric ancef -Check blood cx  COVID exposure -Over weekend, pt was visited by her son who has since tested pos for COVID -Given recent fevers, have placed pt on isolation -Will check repeat COVID test  DVT prophylaxis: SCD's Code Status: Full Family Communication: Pt in room, family not at bedside  Status is: Inpatient  Remains inpatient appropriate because:IV treatments appropriate due to intensity of illness or inability to take PO and Inpatient level of care appropriate due to severity of illness  Dispo:  Patient From: Home  Planned Disposition: Inpatient Rehab  Expected discharge date: 08/08/2020  Medically stable for discharge: No   Consultants:   Cardiology  Procedures:     Antimicrobials: Anti-infectives (From admission, onward)   Start     Dose/Rate Route Frequency Ordered Stop   08/07/20 1400  ceFAZolin (ANCEF) IVPB 1 g/50 mL premix        1 g 100 mL/hr over 30 Minutes Intravenous Every 8 hours 08/07/20 1138     08/04/20 1700  cefTRIAXone (ROCEPHIN) 1 g in sodium chloride 0.9 % 100 mL IVPB  Status:  Discontinued       Note to Pharmacy: Pan-sensitive UTI on urine cx   1 g 200 mL/hr over 30 Minutes Intravenous Every 24 hours 08/04/20 1600 08/04/20 1601   08/04/20 1700  cefTRIAXone (ROCEPHIN) 1 g in sodium chloride 0.9 % 100 mL IVPB       Note to Pharmacy: Pan-sensitive UTI on urine cx   1 g 200 mL/hr over 30 Minutes Intravenous Every 24 hours 08/04/20 1601 08/05/20 1721   08/03/20 1500  cefTRIAXone (ROCEPHIN) 2 g in sodium chloride 0.9 % 100 mL IVPB  Status:  Discontinued        2 g 200 mL/hr over 30 Minutes Intravenous Every 24 hours 08/02/20 1547 08/02/20 1557   08/02/20 1730  meropenem (MERREM) 1 g in sodium chloride 0.9 % 100 mL IVPB   Status:  Discontinued        1 g 200 mL/hr over 30 Minutes Intravenous Every 8 hours 08/02/20 1641 08/04/20 1600   08/02/20 1500  cefTRIAXone (ROCEPHIN) 1 g in sodium chloride 0.9 % 100 mL IVPB        1 g 200 mL/hr over 30 Minutes Intravenous  Once 08/02/20 1458 08/02/20 1628      Subjective: Complaining of B arm pain  Objective: Vitals:   08/06/20 2354 08/07/20 0453 08/07/20 0809 08/07/20 1127  BP: (!) 111/54 (!) 114/54 (!) 112/55 (!) 103/32  Pulse: 95 80 85 80  Resp: 20 20 19 17   Temp: (!) 101.3 F (38.5 C) 98.3 F (36.8 C) 98.5 F (36.9 C) 98.2 F (36.8 C)  TempSrc: Oral Oral Oral Oral  SpO2: 98% 98% 91% 92%  Weight:      Height:        Intake/Output Summary (Last 24 hours) at 08/07/2020 1541 Last data filed at 08/07/2020 1456 Gross per 24 hour  Intake 840 ml  Output 900 ml  Net -60 ml   10/05/2020  08/02/20 1600  Weight: 63 kg    Examination: General exam: Awake, laying in bed, in nad Respiratory system: Normal respiratory effort, no wheezing Cardiovascular system: regular rate, s1, s2 Gastrointestinal system: Soft, nondistended, positive BS Central nervous system: CN2-12 grossly intact, strength intact Extremities: Perfused, no clubbing, B upper arm swelling and tenderness Skin: Normal skin turgor, no notable skin lesions seen Psychiatry: Mood normal // no visual hallucinations   Data Reviewed: I have personally reviewed following labs and imaging studies  CBC: Recent Labs  Lab 08/02/20 1137 08/02/20 1205 08/03/20 0001 08/04/20 0703  WBC 8.3  --  6.3 4.8  NEUTROABS 6.7  --   --  3.5  HGB 13.3 13.3 10.8* 10.9*  HCT 38.9 39.0 32.8* 33.9*  MCV 101.6*  --  102.5* 103.0*  PLT 156  --  110* [id number]*   Basic Metabolic Panel: Recent Labs  Lab 08/03/20 0001 08/04/20 0703 08/05/20 0407 08/06/20 0053 08/07/20 0228  NA 140 140 139 138 140  K 2.7* 3.6 3.6 4.1 3.7  CL 109 107 106 104 105  CO2 21* 23 26 23 26   GLUCOSE 112* 114* 115* 138* 106*  BUN 8  <5* <5* <5* <5*  CREATININE 0.74 0.57 0.65 0.73 0.67  CALCIUM 7.3* 7.7* 8.0* 7.7* 8.3*  MG 1.6* 1.8 1.6* 2.1 1.8   GFR: Estimated Creatinine Clearance: 70.9 mL/min (by C-G formula based on SCr of 0.67 mg/dL). Liver Function Tests: Recent Labs  Lab 08/02/20 1137 08/03/20 0001 08/05/20 0407 08/06/20 0053 08/07/20 0228  AST 25 23 20 26 22   ALT 21 16 16 18 18   ALKPHOS 54 46 40 45 48  BILITOT 0.3 0.5 0.4 0.5 0.5  PROT 6.6 4.8* 4.7* 4.3* 5.0*  ALBUMIN 2.8* 2.1* 2.0* 1.8* 2.0*   No results for input(s): LIPASE, AMYLASE in the last 168 hours. No results for input(s): AMMONIA in the last 168 hours. Coagulation Profile: No results for input(s): INR, PROTIME in the last 168 hours. Cardiac Enzymes: No results for input(s): CKTOTAL, CKMB, CKMBINDEX, TROPONINI in the last 168 hours. BNP (last 3 results) No results for input(s): PROBNP in the last 8760 hours. HbA1C: No results for input(s): HGBA1C in the last 72 hours. CBG: No results for input(s): GLUCAP in the last 168 hours. Lipid Profile: No results for input(s): CHOL, HDL, LDLCALC, TRIG, CHOLHDL, LDLDIRECT in the last 72 hours. Thyroid Function Tests: No results for input(s): TSH, T4TOTAL, FREET4, T3FREE, THYROIDAB in the last 72 hours. Anemia Panel: No results for input(s): VITAMINB12, FOLATE, FERRITIN, TIBC, IRON, RETICCTPCT in the last 72 hours. Sepsis Labs: No results for input(s): PROCALCITON, LATICACIDVEN in the last 168 hours.  Recent Results (from the past 240 hour(s))  Resp Panel by RT-PCR (Flu A&B, Covid) Nasopharyngeal Swab     Status: None   Collection Time: 08/02/20 11:56 AM   Specimen: Nasopharyngeal Swab; Nasopharyngeal(NP) swabs in vial transport medium  Result Value Ref Range Status   SARS Coronavirus 2 by RT PCR NEGATIVE NEGATIVE Final    Comment: (NOTE) SARS-CoV-2 target nucleic acids are NOT DETECTED.  The SARS-CoV-2 RNA is generally detectable in upper respiratory specimens during the acute phase of  infection. The lowest concentration of SARS-CoV-2 viral copies this assay can detect is 138 copies/mL. A negative result does not preclude SARS-Cov-2 infection and should not be used as the sole basis for treatment or other patient management decisions. A negative result may occur with  improper specimen collection/handling, submission of specimen other than nasopharyngeal swab, presence of  viral mutation(s) within the areas targeted by this assay, and inadequate number of viral copies(<138 copies/mL). A negative result must be combined with clinical observations, patient history, and epidemiological information. The expected result is Negative.  Fact Sheet for Patients:  EntrepreneurPulse.com.au  Fact Sheet for Healthcare Providers:  IncredibleEmployment.be  This test is no t yet approved or cleared by the [place] FDA and  has been authorized for detection and/or diagnosis of SARS-CoV-2 by FDA under an Emergency Use Authorization (EUA). This EUA will remain  in effect (meaning this test can be used) for the duration of the COVID-19 declaration under Section 564(b)(1) of the Act, 21 U.S.C.section 360bbb-3(b)(1), unless the authorization is terminated  or revoked sooner.       Influenza A by PCR NEGATIVE NEGATIVE Final   Influenza B by PCR NEGATIVE NEGATIVE Final    Comment: (NOTE) The Xpert Xpress SARS-CoV-2/FLU/RSV plus assay is intended as an aid in the diagnosis of influenza from Nasopharyngeal swab specimens and should not be used as a sole basis for treatment. Nasal washings and aspirates are unacceptable for Xpert Xpress SARS-CoV-2/FLU/RSV testing.  Fact Sheet for Patients: EntrepreneurPulse.com.au  Fact Sheet for Healthcare Providers: IncredibleEmployment.be  This test is not yet approved or cleared by the [place] FDA and has been authorized for detection and/or diagnosis of SARS-CoV-2  by FDA under an Emergency Use Authorization (EUA). This EUA will remain in effect (meaning this test can be used) for the duration of the COVID-19 declaration under Section 564(b)(1) of the Act, 21 U.S.C. section 360bbb-3(b)(1), unless the authorization is terminated or revoked.  Performed at [facility], [place] [address] [place], [place] [address]   Urine culture     Status: Abnormal   Collection Time: 08/02/20  2:00 PM   Specimen: Urine, Random  Result Value Ref Range Status   Specimen Description URINE, RANDOM  Final   Special Requests   Final    NONE Performed at [facility], [place] [address] [place], [place] [address]    Culture >=100,000 COLONIES/mL ESCHERICHIA COLI (A)  Final   Report Status 08/04/2020 FINAL  Final   Organism ID, Bacteria ESCHERICHIA COLI (A)  Final      Susceptibility   Escherichia coli - MIC*    AMPICILLIN <=2 SENSITIVE Sensitive     CEFAZOLIN <=4 SENSITIVE Sensitive     CEFEPIME <=0.12 SENSITIVE Sensitive     CEFTRIAXONE <=0.25 SENSITIVE Sensitive     CIPROFLOXACIN <=0.25 SENSITIVE Sensitive     GENTAMICIN <=1 SENSITIVE Sensitive     IMIPENEM <=0.25 SENSITIVE Sensitive     NITROFURANTOIN <=16 SENSITIVE Sensitive     TRIMETH/SULFA <=20 SENSITIVE Sensitive     AMPICILLIN/SULBACTAM <=2 SENSITIVE Sensitive     PIP/TAZO <=4 SENSITIVE Sensitive     * >=100,000 COLONIES/mL ESCHERICHIA COLI  Culture, blood (routine x 2)     Status: None (Preliminary result)   Collection Time: 08/02/20 11:20 PM   Specimen: BLOOD RIGHT HAND  Result Value Ref Range Status   Specimen Description BLOOD RIGHT HAND  Final   Special Requests   Final    BOTTLES DRAWN AEROBIC AND ANAEROBIC Blood Culture adequate volume   Culture   Final    NO GROWTH 4 DAYS Performed at [facility], [address], [place], [place] [address]    Report Status PENDING  Incomplete  Culture, blood (routine x 2)     Status: None (Preliminary result)   Collection Time: 08/02/20  11:35 PM   Specimen: BLOOD  Result Value Ref Range Status   Specimen Description BLOOD RT THUMB  Final   Special Requests AEROBIC BOTTLE ONLY Blood Culture adequate volume  Final   Culture   Final    NO GROWTH 4 DAYS Performed at [place] [address] [place], [place] [address]    Report Status PENDING  Incomplete     Radiology Studies: No results found.  Scheduled Meds: . amiodarone  200 mg Oral BID  . Cenobamate  100 mg Oral Daily  . cholecalciferol  1,000 Units Oral Daily  . escitalopram  10 mg Oral Daily  . gabapentin  200 mg Oral QHS  . lacosamide  200 mg Oral BID  . multivitamin with minerals  1 tablet Oral Daily  . phenytoin  100 mg Oral Daily   And  . phenytoin  200 mg Oral QHS  . senna-docusate  2 tablet Oral QHS  . sodium chloride flush  10-40 mL Intracatheter Q12H  . vitamin B-12  1,000 mcg Oral Daily   Continuous Infusions: .  ceFAZolin (ANCEF) IV       LOS: 5 days   Marylu Lund, MD [facility] Pager On Amion  If 7PM-7AM, please contact night-coverage 08/07/2020, 3:41 PM

## 2020-08-08 DIAGNOSIS — M7989 Other specified soft tissue disorders: Secondary | ICD-10-CM

## 2020-08-08 DIAGNOSIS — I483 Typical atrial flutter: Secondary | ICD-10-CM | POA: Diagnosis not present

## 2020-08-08 LAB — COMPREHENSIVE METABOLIC PANEL
ALT: 17 U/L (ref 0–44)
AST: 20 U/L (ref 15–41)
Albumin: 2 g/dL — ABNORMAL LOW (ref 3.5–5.0)
Alkaline Phosphatase: 52 U/L (ref 38–126)
Anion gap: 10 (ref 5–15)
BUN: <5 mg/dL — ABNORMAL LOW (ref 8–23)
CO2: 28 mmol/L (ref 22–32)
Calcium: 8.1 mg/dL — ABNORMAL LOW (ref 8.9–10.3)
Chloride: 103 mmol/L (ref 98–111)
Creatinine, Ser: 0.67 mg/dL (ref 0.44–1.00)
GFR, Estimated: >60 mL/min (ref >60)
Glucose, Bld: 105 mg/dL — ABNORMAL HIGH (ref 70–99)
Potassium: 3.4 mmol/L — ABNORMAL LOW (ref 3.5–5.1)
Sodium: 141 mmol/L (ref 135–145)
Total Bilirubin: 0.4 mg/dL (ref 0.3–1.2)
Total Protein: 5.1 g/dL — ABNORMAL LOW (ref 6.5–8.1)

## 2020-08-08 LAB — CULTURE, BLOOD (ROUTINE X 2)
Culture: NO GROWTH
Culture: NO GROWTH
Special Requests: ADEQUATE
Special Requests: ADEQUATE

## 2020-08-08 LAB — CBC
HCT: 31.5 % — ABNORMAL LOW (ref 36.0–46.0)
Hemoglobin: 10 g/dL — ABNORMAL LOW (ref 12.0–15.0)
MCH: 32.9 pg (ref 26.0–34.0)
MCHC: 31.7 g/dL (ref 30.0–36.0)
MCV: 103.6 fL — ABNORMAL HIGH (ref 80.0–100.0)
Platelets: 249 10*3/uL (ref 150–400)
RBC: 3.04 MIL/uL — ABNORMAL LOW (ref 3.87–5.11)
RDW: 12.4 % (ref 11.5–15.5)
WBC: 7.1 10*3/uL (ref 4.0–10.5)
nRBC: 0 % (ref 0.0–0.2)

## 2020-08-08 LAB — MAGNESIUM: Magnesium: 1.8 mg/dL (ref 1.7–2.4)

## 2020-08-08 MED ORDER — POTASSIUM CHLORIDE CRYS ER 20 MEQ PO TBCR
40.0000 meq | EXTENDED_RELEASE_TABLET | Freq: Once | ORAL | Status: AC
  Administered 2020-08-08: 40 meq via ORAL
  Filled 2020-08-08: qty 2

## 2020-08-08 MED ORDER — MAGNESIUM SULFATE 2 GM/50ML IV SOLN
2.0000 g | Freq: Once | INTRAVENOUS | Status: AC
  Administered 2020-08-08: 2 g via INTRAVENOUS
  Filled 2020-08-08: qty 50

## 2020-08-08 NOTE — Progress Notes (Addendum)
Progress Note  Patient Name: [name] Date of Encounter: 08/08/2020  [facility] Cardiologist: Pixie Casino, MD   Subjective   No acute overnight events. Patient states she does not feel well this morning. Still complaining of right arm pain and swelling. Per chart review, she had IV infiltration while receiving IV Amiodarone so this is felt to be due to this. She also notes some chest pain right below C-collar that is very tender to palpitations so suspect musculoskeletal. She states she feels she is a little short of breath but looks comfortable.  Inpatient Medications    Scheduled Meds: . amiodarone  200 mg Oral BID  . Cenobamate  100 mg Oral Daily  . cholecalciferol  1,000 Units Oral Daily  . escitalopram  10 mg Oral Daily  . gabapentin  200 mg Oral QHS  . lacosamide  200 mg Oral BID  . multivitamin with minerals  1 tablet Oral Daily  . phenytoin  100 mg Oral Daily   And  . phenytoin  200 mg Oral QHS  . senna-docusate  2 tablet Oral QHS  . sodium chloride flush  10-40 mL Intracatheter Q12H  . vitamin B-12  1,000 mcg Oral Daily   Continuous Infusions: .  ceFAZolin (ANCEF) IV 1 g (08/08/20 0655)  . magnesium sulfate bolus IVPB 2 g (08/08/20 0921)   PRN Meds: acetaminophen, albuterol, HYDROmorphone (DILAUDID) injection, loperamide, meclizine, oxyCODONE, sodium chloride flush   Vital Signs    Vitals:   08/07/20 1650 08/07/20 2230 08/07/20 2322 08/08/20 0808  BP: (!) 113/59 (!) 111/56 (!) 99/53 (!) 106/45  Pulse: 86 85 87 74  Resp: 18 16 15 18   Temp: 98.4 F (36.9 C) 99.5 F (37.5 C) 99.9 F (37.7 C) 98 F (36.7 C)  TempSrc: Oral Oral Oral Oral  SpO2: 97% 91% 95% 91%  Weight:      Height:        Intake/Output Summary (Last 24 hours) at 08/08/2020 0956 Last data filed at 08/07/2020 2200 Gross per 24 hour  Intake 480 ml  Output --  Net 480 ml   Last 3 Weights 08/02/2020 06/06/2020 04/24/2020  Weight (lbs) 138 lb 14.2 oz 138 lb 130 lb  Weight (kg) 63  kg 62.596 kg 58.968 kg      Telemetry    Normal sinus rhythm with rates in the 70's. - Personally Reviewed  ECG    No new ECG tracing today. - Personally Reviewed  Physical Exam   GEN: No acute distress.   Neck: Unable to assess JVD due to c-collar. Cardiac: RRR. No murmurs, rubs, or gallops. Chest pain reproducible with palpation of chest wall. Respiratory: Mild decreased breath sounds but otherwise clear to ascultation. No wheezes, rhonchi, or rales. GI: Soft, non-distended, and non-tender. MS: Upper extremity edema (right > left) with erythema and tenderness to palpation on the right (felt to be from IV infiltration). No lower extremity edema. Skin: Warm and dry. Neuro:  No focal deficits. Psych: Normal affect. Responds appropriately.  Labs    High Sensitivity Troponin:   Recent Labs  Lab 08/02/20 1137 08/02/20 1140 08/02/20 1935  TROPONINIHS 25* 24* 27*      Chemistry Recent Labs  Lab 08/06/20 0053 08/07/20 0228 08/08/20 0108  NA 138 140 141  K 4.1 3.7 3.4*  CL 104 105 103  CO2 23 26 28   GLUCOSE 138* 106* 105*  BUN <5* <5* <5*  CREATININE 0.73 0.67 0.67  CALCIUM 7.7* 8.3* 8.1*  PROT 4.3* 5.0* 5.1*  ALBUMIN 1.8* 2.0* 2.0*  AST 26 22 20   ALT 18 18 17   ALKPHOS 45 48 52  BILITOT 0.5 0.5 0.4  GFRNONAA >60 >60 >60  ANIONGAP 11 9 10      Hematology Recent Labs  Lab 08/03/20 0001 08/04/20 0703 08/08/20 0108  WBC 6.3 4.8 7.1  RBC 3.20* 3.29* 3.04*  HGB 10.8* 10.9* 10.0*  HCT 32.8* 33.9* 31.5*  MCV 102.5* 103.0* 103.6*  MCH 33.8 33.1 32.9  MCHC 32.9 32.2 31.7  RDW 11.9 12.0 12.4  PLT 110* 134* 249    BNPNo results for input(s): BNP, PROBNP in the last 168 hours.   DDimer No results for input(s): DDIMER in the last 168 hours.   Radiology    No results found.  Cardiac Studies   Echocardiogram 08/03/2020: Impression: 1. Left ventricular ejection fraction, by estimation, is 55 to 60%. The  left ventricle has normal function. The left  ventricle has no regional  wall motion abnormalities. Left ventricular diastolic parameters are  indeterminate.  2. Right ventricular systolic function is normal. The right ventricular  size is normal. There is mildly elevated pulmonary artery systolic  pressure. The estimated right ventricular systolic pressure is [id number] mmHg.  3. The mitral valve is normal in structure. No evidence of mitral valve  regurgitation. No evidence of mitral stenosis.  4. The aortic valve is tricuspid. Aortic valve regurgitation is not  visualized. No aortic stenosis is present.  5. The inferior vena cava is dilated in size with <50% respiratory  variability, suggesting right atrial pressure of 15 mmHg.   Patient Profile     65 y.o. 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 Cardiology consulted for evaluation of new onset atrial flutter with RVR.  Assessment & Plan    New Onset Atrial Flutter/Fibrillation - Converted back to sinus rhythm ib 1/3. Maintaining sinus rhythm at this time with rates in the 70's.  - Echo showed LVEF of 55-60% with normal wall motion and mildly elevated PASP. - Currently on Amiodarone 200mg  twice daily. If no recurrence of atrial fibrillation by time of discharge, will plan to stop Amiodarone as well. - Lopressor stopped on 1/3 due to hypotension. - Initially started on Digoxin but this has since been stopped.  - CHA2DS2-VASc = 1 (female). Not felt to be a good candidate for anticoagulation given history of frequent seizures and history of right occipital AVM/intracranial hemorrhage as well as recent C2 fracture.  Chest Pain - Patient does report chest pain this morning below c-collar. She is markedly tender with palpation of chest wall at this location.  - EKG shows no acute ischemic changes.  - High-sensitivity troponin minimally  elevated and flat at 25 >> 24 >> 27. Not consistent with ACS.  - Echo showed normal EF and normal wall motion. - Suspect musculoskeletal pain. No further inpatient work-up needed.  Hypotensive - Patient had episode of hypotension on 1/3. Improved with fluids. BP still soft but stable (systolic BP ranging from 99 to 114). - Lopressor was stopped due to soft BP.  Hypokalemia - Potassium 3.2 on admission and dropped to 2.7 on 12/31. Repleted and improved but down to 3.4 today. Repleted again by primary team. - Magnesium 1.8 today. Also repleted by primary team. - Continue to monitor.   Bilateral Arm Swelling/Pain - Right arm significantly swollen with erythema and tenderness.  Worse than left.  - This has been felt to be due to IV infiltration while receiving IV Amiodarone.  - She was also started on Ancef yesterday. - Will defer to primary team about whether upper extremity doppler is needed to rule out DVT.  Otherwise per primary team. 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  I have reviewed the chart, notes and new data.  I agree with PA/NP's note.  Key new complaints: pain related to arm swelling and C-collar. Chest pain is not anginal.  Key examination changes: no changes from yesterday Key new findings / data: no recurrence of AFib on telemetry.  PLAN: [facility] will sign off.   Medication Recommendations:  Continue amiodarone 200 mg daily, but if arrhythmia does not recur during hospital stay, DC amiodarone at time of discharge. No anticoagulation is  recommended. Other recommendations (labs, testing, etc):  Supportive/symptomatic care for chemical phlebitis/cellulitis Follow up as an outpatient:  In a month after DC.   Thurmon Fair, MD, [facility] [phone] 08/08/2020, 12:46 PM

## 2020-08-08 NOTE — Progress Notes (Signed)
PROGRESS NOTE    [name]  AYT:[id number] DOB: 1955-12-31 DOA: 08/02/2020 PCP: Pearson Grippe, MD    Brief Narrative:  65 y.o. 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  She does not know whether or not she tripped and fell but she thinks she passed out. Reports prior to this event she was having dysuria for a few days. She had another fall about a week prior to that and does not remember the circumstances around the fall.  Work-up in the ED revealed new onset A. fib with RVR, urine analysis positive for pyuria, C2 lateral mass fracture, left hand/finger fracture, possible left pyelonephritis on CT scan.  She was seen by neurosurgery, recommended conservative management, to wear a c-collar at all times. She now has a left upper extremity splint in place.  Cardiology was consulted to assist with new onset A. fib with RVR.  TRH was asked to admit for further work-up and management of present condition.  Assessment & Plan:   Active Problems:   Fall  Fall, unclear etiology -On further questioning, pt reports remembering events of falling and states "missing a step" up a staircase.  So this seems like a mechanical fall. -2d echo performed, findings of normal LVEF -Therapy recs for CIR when stable -Per therapy, concerns of vertigo on ambulation with PT, meclizine ordered for symptom relief  New onset rapid aflutter No prior history of A. Fib Treated initially with Cardizem drip, however was later d/c secondary to hypotension 2d echo per above Cardiology following. Had been on amiodarone  IV.  Metoprolol and digoxin which were added have also been stopped. Patient converted to sinus rhythm. Now on PO amiodarone.  As per cardiology follow-up 1/5, currently on amiodarone 200 mg twice daily but if no recurrence of atrial fibrillation by time of discharge then plan to stop amiodarone as well. CHA2DS2-VASc = 1 (female). Not felt to be a good candidate for anticoagulation given history of frequent seizures and history of right occipital AVM/intracranial hemorrhage as well as recent C2 fracture.  Elevated troponin likely demand ischemia in the setting of A. fib with RVR Troponin peaked at 25 and trended down Suspect demand ischemia related to rapid heart rate 2d echo as per above Cardiology continues to follow Patient reported chest pain on 1/5.  To me she reported that this was on moving her right upper extremity but to Cardiology she was tender to palpation of chest wall, indicating musculoskeletal pain.  No further work-up recommended.  Hypotension Patient had episode of hypotension on 1/3 which improved with IV fluids.  Lopressor was discontinued.  Hypokalemia/hypomagnesemia Replaced and follow.  Right> left upper extremity pain Right upper extremity pain suspected due to IV amiodarone infiltration. Although empirically started on IV Ancef, less likely to be cellulitis especially if bilateral. Discontinued empirically started Ancef. Elevate right upper extremity and reassess in a.m.  If not improving, could consider venous Doppler to rule out DVT. Although she had temperature of 101.3 on 1/3 night, this could also happen with the infiltration. Blood cultures negative to date  Pan-sensitive ecoli UTI with suspected pyelonephritis on CT scan History of ESBL E.  coli in 2019 Presented with urine analysis positive for pyuria urine culture pos for pan-sensitive ecoli Initially started on meropenem, now on rocephin No leukocytosis Completed course of abx for UTI  C2 left  lateral mass fracture post fall Seen by neurosurgery Recommended treatment with aspen c-collar at all times Cont with analgesia as needed Outpatient follow-up with neurosurgery.  Left hand/finger fracture post fall She currently has a splint in place Continue with analgesia with bowel regimen  Prolonged QTC Presented with QTC of 545 Avoid QTC prolonging agents Anticipate keeping K>4 and Mg>2  Seizure disorder Subtherapeutic level of Dilantin, 2.9 Not postictal at the time of this visit Resume home regimen, she is on several seizure medications Pharmacy assisting with dosage Seizure precautions. Cont to be seizure free  Polyneuropathy Resume home Neurontin  Chronic anxiety/depression Stable She is on escitalopram  Hyperglycemia likely stress-induced in the setting of fractures Hgb A1c 5.0 Cont to monitor for now  Prior tobacco abuse history -Reported long hx of prior tobacco abuse -Pt elected to quit smoking 4 years ago and remains tobacco free -Pt congratulated  COVID exposure -Over weekend, pt was visited by her son who has since tested pos for COVID -Given recent fevers, have placed pt on isolation -Repeat Covid testing negative on 1/4  DVT prophylaxis: SCD's Code Status: Full Family Communication: Pt in room, family not at bedside  Status is: Inpatient  Remains inpatient appropriate because:IV treatments appropriate due to intensity of illness or inability to take PO and Inpatient level of care appropriate due to severity of illness  Dispo:  Patient From: Home  Planned Disposition: Inpatient Rehab  Expected discharge date: 08/08/2020  Medically stable for discharge: No   Consultants:   Cardiology  Procedures:   Aspen collar  Antimicrobials: Anti-infectives (From admission, onward)   Start     Dose/Rate Route Frequency Ordered Stop   08/07/20 1400  ceFAZolin (ANCEF) IVPB 1 g/50 mL premix        1 g 100 mL/hr over 30 Minutes Intravenous  Every 8 hours 08/07/20 1138     08/04/20 1700  cefTRIAXone (ROCEPHIN) 1 g in sodium chloride 0.9 % 100 mL IVPB  Status:  Discontinued       Note to Pharmacy: Pan-sensitive UTI on urine cx   1 g 200 mL/hr over 30 Minutes Intravenous Every 24 hours 08/04/20 1600 08/04/20 1601   08/04/20 1700  cefTRIAXone (ROCEPHIN) 1 g in sodium chloride 0.9 % 100 mL IVPB       Note to Pharmacy: Pan-sensitive UTI on urine cx   1 g 200 mL/hr over 30 Minutes Intravenous Every 24 hours 08/04/20 1601 08/05/20 1721   08/03/20 1500  cefTRIAXone (ROCEPHIN) 2 g in sodium chloride 0.9 % 100 mL IVPB  Status:  Discontinued        2 g 200 mL/hr over 30 Minutes Intravenous Every 24 hours 08/02/20 1547 08/02/20 1557   08/02/20 1730  meropenem (MERREM) 1 g in sodium chloride 0.9 % 100 mL IVPB  Status:  Discontinued        1 g 200 mL/hr over 30 Minutes Intravenous Every 8 hours 08/02/20 1641 08/04/20 1600   08/02/20 1500  cefTRIAXone (ROCEPHIN) 1 g in sodium chloride 0.9 % 100 mL IVPB        1 g 200 mL/hr over 30 Minutes Intravenous  Once 08/02/20 1458 08/02/20 1628      Subjective: Patient seen along with her female RN in room.  Reports significant right upper extremity  pain, pain around right breast, worse with movement of right upper extremity.  Denied dyspnea at that time.  Objective: Vitals:   08/07/20 2322 08/08/20 0808 08/08/20 1224 08/08/20 1618  BP: (!) 99/53 (!) 106/45 123/70 (!) 135/58  Pulse: 87 74 69 65  Resp: 15 18 16 17   Temp: 99.9 F (37.7 C) 98 F (36.7 C) 97.7 F (36.5 C) 98.1 F (36.7 C)  TempSrc: Oral Oral Oral Oral  SpO2: 95% 91% 100% 97%  Weight:      Height:        Intake/Output Summary (Last 24 hours) at 08/08/2020 1749 Last data filed at 08/08/2020 1420 Gross per 24 hour  Intake 390 ml  Output --  Net 390 ml   Filed Weights   08/02/20 1600  Weight: 63 kg    Examination: General exam: Middle-age female, moderately built and frail, sitting up on uncomfortably in reclining  chair. Neck: Has cervical collar in place. Respiratory system: Clear to auscultation.  No increased work of breathing. Cardiovascular system: S1 and S2 heard, RRR.  No JVD, murmurs or pedal edema.  Telemetry personally reviewed: Sinus rhythm. Gastrointestinal system: Soft, nondistended, positive BS Central nervous system: CN2-12 grossly intact, strength intact Extremities: Perfused, no clubbing, diffuse swelling of right upper extremity.  Some induration on medial aspect of left forearm and upper arm.  Diffuse tenderness.  No erythema or increased warmth.  Bounding right radial pulse.  Good distal capillary refill.  Motor function intact.  Mild left upper extremity swelling. Skin: Normal skin turgor, no notable skin lesions seen Psychiatry: Mood normal // no visual hallucinations   Data Reviewed: I have personally reviewed following labs and imaging studies  CBC: Recent Labs  Lab 08/02/20 1137 08/02/20 1205 08/03/20 0001 08/04/20 0703 08/08/20 0108  WBC 8.3  --  6.3 4.8 7.1  NEUTROABS 6.7  --   --  3.5  --   HGB 13.3 13.3 10.8* 10.9* 10.0*  HCT 38.9 39.0 32.8* 33.9* 31.5*  MCV 101.6*  --  102.5* 103.0* 103.6*  PLT 156  --  110* 134* 0000000   Basic Metabolic Panel: Recent Labs  Lab 08/04/20 0703 08/05/20 0407 08/06/20 0053 08/07/20 0228 08/08/20 0108  NA 140 139 138 140 141  K 3.6 3.6 4.1 3.7 3.4*  CL 107 106 104 105 103  CO2 23 26 23 26 28   GLUCOSE 114* 115* 138* 106* 105*  BUN <5* <5* <5* <5* <5*  CREATININE 0.57 0.65 0.73 0.67 0.67  CALCIUM 7.7* 8.0* 7.7* 8.3* 8.1*  MG 1.8 1.6* 2.1 1.8 1.8   GFR: Estimated Creatinine Clearance: 70.9 mL/min (by C-G formula based on SCr of 0.67 mg/dL). Liver Function Tests: Recent Labs  Lab 08/03/20 0001 08/05/20 0407 08/06/20 0053 08/07/20 0228 08/08/20 0108  AST 23 20 26 22 20   ALT 16 16 18 18 17   ALKPHOS 46 40 45 48 52  BILITOT 0.5 0.4 0.5 0.5 0.4  PROT 4.8* 4.7* 4.3* 5.0* 5.1*  ALBUMIN 2.1* 2.0* 1.8* 2.0* 2.0*      Recent Results (from the past 240 hour(s))  Resp Panel by RT-PCR (Flu A&B, Covid) Nasopharyngeal Swab     Status: None   Collection Time: 08/02/20 11:56 AM   Specimen: Nasopharyngeal Swab; Nasopharyngeal(NP) swabs in vial transport medium  Result Value Ref Range Status   SARS Coronavirus 2 by RT PCR NEGATIVE NEGATIVE Final    Comment: (NOTE) SARS-CoV-2 target nucleic acids are NOT DETECTED.  The SARS-CoV-2 RNA is generally detectable in  upper respiratory specimens during the acute phase of infection. The lowest concentration of SARS-CoV-2 viral copies this assay can detect is 138 copies/mL. A negative result does not preclude SARS-Cov-2 infection and should not be used as the sole basis for treatment or other patient management decisions. A negative result may occur with  improper specimen collection/handling, submission of specimen other than nasopharyngeal swab, presence of viral mutation(s) within the areas targeted by this assay, and inadequate number of viral copies(<138 copies/mL). A negative result must be combined with clinical observations, patient history, and epidemiological information. The expected result is Negative.  Fact Sheet for Patients:  EntrepreneurPulse.com.au  Fact Sheet for Healthcare Providers:  IncredibleEmployment.be  This test is no t yet approved or cleared by the [place] FDA and  has been authorized for detection and/or diagnosis of SARS-CoV-2 by FDA under an Emergency Use Authorization (EUA). This EUA will remain  in effect (meaning this test can be used) for the duration of the COVID-19 declaration under Section 564(b)(1) of the Act, 21 U.S.C.section 360bbb-3(b)(1), unless the authorization is terminated  or revoked sooner.       Influenza A by PCR NEGATIVE NEGATIVE Final   Influenza B by PCR NEGATIVE NEGATIVE Final    Comment: (NOTE) The Xpert Xpress SARS-CoV-2/FLU/RSV plus assay is intended as  an aid in the diagnosis of influenza from Nasopharyngeal swab specimens and should not be used as a sole basis for treatment. Nasal washings and aspirates are unacceptable for Xpert Xpress SARS-CoV-2/FLU/RSV testing.  Fact Sheet for Patients: EntrepreneurPulse.com.au  Fact Sheet for Healthcare Providers: IncredibleEmployment.be  This test is not yet approved or cleared by the [place] FDA and has been authorized for detection and/or diagnosis of SARS-CoV-2 by FDA under an Emergency Use Authorization (EUA). This EUA will remain in effect (meaning this test can be used) for the duration of the COVID-19 declaration under Section 564(b)(1) of the Act, 21 U.S.C. section 360bbb-3(b)(1), unless the authorization is terminated or revoked.  Performed at [facility], [place] [address] [place], [place] [address]   Urine culture     Status: Abnormal   Collection Time: 08/02/20  2:00 PM   Specimen: Urine, Random  Result Value Ref Range Status   Specimen Description URINE, RANDOM  Final   Special Requests   Final    NONE Performed at [facility], [place] [address], [place], [place] [address]    Culture >=100,000 COLONIES/mL ESCHERICHIA COLI (A)  Final   Report Status 08/04/2020 FINAL  Final   Organism ID, Bacteria ESCHERICHIA COLI (A)  Final      Susceptibility   Escherichia coli - MIC*    AMPICILLIN <=2 SENSITIVE Sensitive     CEFAZOLIN <=4 SENSITIVE Sensitive     CEFEPIME <=0.12 SENSITIVE Sensitive     CEFTRIAXONE <=0.25 SENSITIVE Sensitive     CIPROFLOXACIN <=0.25 SENSITIVE Sensitive     GENTAMICIN <=1 SENSITIVE Sensitive     IMIPENEM <=0.25 SENSITIVE Sensitive     NITROFURANTOIN <=16 SENSITIVE Sensitive     TRIMETH/SULFA <=20 SENSITIVE Sensitive     AMPICILLIN/SULBACTAM <=2 SENSITIVE Sensitive     PIP/TAZO <=4 SENSITIVE Sensitive     * >=100,000 COLONIES/mL ESCHERICHIA COLI  Culture, blood (routine x 2)     Status: None    Collection Time: 08/02/20 11:20 PM   Specimen: BLOOD RIGHT HAND  Result Value Ref Range Status   Specimen Description BLOOD RIGHT HAND  Final   Special Requests   Final  BOTTLES DRAWN AEROBIC AND ANAEROBIC Blood Culture adequate volume   Culture   Final    NO GROWTH 5 DAYS Performed at [facility], [place] [address] [place], [place] [address]    Report Status 08/08/2020 FINAL  Final  Culture, blood (routine x 2)     Status: None   Collection Time: 08/02/20 11:35 PM   Specimen: BLOOD  Result Value Ref Range Status   Specimen Description BLOOD RT THUMB  Final   Special Requests AEROBIC BOTTLE ONLY Blood Culture adequate volume  Final   Culture   Final    NO GROWTH 5 DAYS Performed at [place] [address] [place], [place] [address]    Report Status 08/08/2020 FINAL  Final  Culture, blood (routine x 2)     Status: None (Preliminary result)   Collection Time: 08/07/20  1:02 PM   Specimen: BLOOD LEFT HAND  Result Value Ref Range Status   Specimen Description BLOOD LEFT HAND  Final   Special Requests   Final    BOTTLES DRAWN AEROBIC AND ANAEROBIC Blood Culture results may not be optimal due to an inadequate volume of blood received in culture bottles   Culture   Final    NO GROWTH < 24 HOURS Performed at [facility], [place] [address] [place], [place] [address]    Report Status PENDING  Incomplete  Culture, blood (routine x 2)     Status: None (Preliminary result)   Collection Time: 08/07/20  1:03 PM   Specimen: BLOOD LEFT HAND  Result Value Ref Range Status   Specimen Description BLOOD LEFT HAND  Final   Special Requests   Final    BOTTLES DRAWN AEROBIC AND ANAEROBIC Blood Culture results may not be optimal due to an inadequate volume of blood received in culture bottles   Culture   Final    NO GROWTH < 24 HOURS Performed at [facility], [place] [address], [place], [place] [address]    Report Status PENDING  Incomplete  Resp Panel by RT-PCR (Flu  A&B, Covid) Nasopharyngeal Swab     Status: None   Collection Time: 08/07/20  3:49 PM   Specimen: Nasopharyngeal Swab; Nasopharyngeal(NP) swabs in vial transport medium  Result Value Ref Range Status   SARS Coronavirus 2 by RT PCR NEGATIVE NEGATIVE Final    Comment: (NOTE) SARS-CoV-2 target nucleic acids are NOT DETECTED.  The SARS-CoV-2 RNA is generally detectable in upper respiratory specimens during the acute phase of infection. The lowest concentration of SARS-CoV-2 viral copies this assay can detect is 138 copies/mL. A negative result does not preclude SARS-Cov-2 infection and should not be used as the sole basis for treatment or other patient management decisions. A negative result may occur with  improper specimen collection/handling, submission of specimen other than nasopharyngeal swab, presence of viral mutation(s) within the areas targeted by this assay, and inadequate number of viral copies(<138 copies/mL). A negative result must be combined with clinical observations, patient history, and epidemiological information. The expected result is Negative.  Fact Sheet for Patients:  EntrepreneurPulse.com.au  Fact Sheet for Healthcare Providers:  IncredibleEmployment.be  This test is no t yet approved or cleared by the [place] FDA and  has been authorized for detection and/or diagnosis of SARS-CoV-2 by FDA under an Emergency Use Authorization (EUA). This EUA will remain  in effect (meaning this test can be used) for the duration of the COVID-19 declaration under Section 564(b)(1) of the  Act, 21 U.S.C.section 360bbb-3(b)(1), unless the authorization is terminated  or revoked sooner.       Influenza A by PCR NEGATIVE NEGATIVE Final   Influenza B by PCR NEGATIVE NEGATIVE Final    Comment: (NOTE) The Xpert Xpress SARS-CoV-2/FLU/RSV plus assay is intended as an aid in the diagnosis of influenza from Nasopharyngeal swab specimens  and should not be used as a sole basis for treatment. Nasal washings and aspirates are unacceptable for Xpert Xpress SARS-CoV-2/FLU/RSV testing.  Fact Sheet for Patients: EntrepreneurPulse.com.au  Fact Sheet for Healthcare Providers: IncredibleEmployment.be  This test is not yet approved or cleared by the [place] FDA and has been authorized for detection and/or diagnosis of SARS-CoV-2 by FDA under an Emergency Use Authorization (EUA). This EUA will remain in effect (meaning this test can be used) for the duration of the COVID-19 declaration under Section 564(b)(1) of the Act, 21 U.S.C. section 360bbb-3(b)(1), unless the authorization is terminated or revoked.  Performed at [facility], [place] [address] [place], [place] [address]      Radiology Studies: No results found.  Scheduled Meds: . amiodarone  200 mg Oral BID  . Cenobamate  100 mg Oral Daily  . cholecalciferol  1,000 Units Oral Daily  . escitalopram  10 mg Oral Daily  . gabapentin  200 mg Oral QHS  . lacosamide  200 mg Oral BID  . multivitamin with minerals  1 tablet Oral Daily  . phenytoin  100 mg Oral Daily   And  . phenytoin  200 mg Oral QHS  . senna-docusate  2 tablet Oral QHS  . sodium chloride flush  10-40 mL Intracatheter Q12H  . vitamin B-12  1,000 mcg Oral Daily   Continuous Infusions: .  ceFAZolin (ANCEF) IV 1 g (08/08/20 1420)     LOS: 6 days   Vernell Leep, MD, [place], [facility]. [facility]  To contact the attending provider between 7A-7P or the covering provider during after hours 7P-7A, please log into the web site [email or web address] and access using universal [place] password for that web site. If you do not have the password, please call the hospital operator.

## 2020-08-08 NOTE — Progress Notes (Signed)
PT Cancellation Note  Patient Details Name: [name] MRN: [id number] DOB: Feb 11, 1956   Cancelled Treatment:    Reason Eval/Treat Not Completed: (P) Patient declined, no reason specified (Pt currently eating lunch, will f/u when finished.  Of note patient with C-collar improperly placed when chin inside of brace instead of supported.)   Daunte Oestreich Artis Delay 08/08/2020, 1:02 PM  Bonney Leitz , PTA Acute Rehabilitation Services Pager [phone] Office [phone]

## 2020-08-08 NOTE — Progress Notes (Signed)
Physical Therapy Treatment Patient Details Name: [name] MRN: [id number] DOB: 1956/05/25 Today's Date: 08/08/2020    History of Present Illness 65 y.o. female with fall resulting in C2 fracture and has left hand/finger fracture with splint in place.  Pt with new onset atrial flutter with rapid ventricular response as well as has UTI.  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    PT Comments    Pt with poor recall of spinal precautions this session.  Pt required repeated cueing throughout session.  No drop in BP with change in position and no c/o dizziness. Continue to recommend rehab in a post acute setting at this time.     Follow Up Recommendations  CIR;Supervision/Assistance - 24 hour     Equipment Recommendations  3in1 (PT)    Recommendations for Other Services       Precautions / Restrictions Precautions Precautions: Fall;Cervical Precaution Booklet Issued: No Precaution Comments: Reviewed cervical precautions Required Braces or Orthoses: Cervical Brace Cervical Brace: Hard collar;At all times Splint/Cast: left UE ("minimize weight bearing to what she can tolerate" per [facility]) Restrictions Weight Bearing Restrictions: No LUE Weight Bearing: Non weight bearing Other Position/Activity Restrictions: "minimize weight bearing to what she can toleate" per Dr Wyline Copas    Mobility  Bed Mobility Overal bed mobility: Needs Assistance Bed Mobility: Rolling;Sidelying to Sit;Sit to Supine Rolling: Min assist Sidelying to sit: Mod assist   Sit to supine: Supervision   General bed mobility comments: not following commands for sidelying to supine with poor recall of spinal precautions.  Pt flexing forward excessively in bed with cues to limit  bending.  Transfers Overall transfer level: Needs assistance Equipment used: Rolling walker (2 wheeled) Transfers: Sit to/from Stand Sit to Stand: Min assist         General transfer comment: Min A for power up and then to maintain balance.  Ambulation/Gait Ambulation/Gait assistance: Min assist Gait Distance (Feet): 4 Feet Assistive device: Rolling walker (2 wheeled) Gait Pattern/deviations: Step-to pattern     General Gait Details: Side stepping to the R to move up to [facility].   Stairs             Wheelchair Mobility    Modified Rankin (Stroke Patients Only)       Balance Overall balance assessment: Needs assistance Sitting-balance support: No upper extremity supported;Feet supported Sitting balance-Leahy Scale: Poor Sitting balance - Comments: needed at least right UE support for balance   Standing balance support: Bilateral upper extremity supported;During functional activity Standing balance-Leahy Scale: Poor                              Cognition Arousal/Alertness: Awake/alert Behavior During Therapy: WFL for tasks assessed/performed Overall Cognitive Status: History of cognitive impairments - at baseline                                 General Comments: PMH of CVA, poor recall of cervical precautions and constant cues to maintain precautions and placement of C-collar this session.      Exercises      General Comments        Pertinent Vitals/Pain Pain Assessment: Faces Faces Pain Scale: Hurts even more Pain  Location: "I hurt everywhere" Pain Descriptors / Indicators: Aching;Grimacing;Guarding Pain Intervention(s): Monitored during session;Repositioned    Home Living                      Prior Function            PT Goals (current goals can now be found in the care plan section) Acute Rehab PT Goals Patient Stated Goal: Be comfortable moving around again. Potential to Achieve Goals: Good Progress  towards PT goals: Progressing toward goals    Frequency    Min 3X/week      PT Plan Current plan remains appropriate    Co-evaluation              AM-PAC PT "6 Clicks" Mobility   Outcome Measure  Help needed turning from your back to your side while in a flat bed without using bedrails?: A Little Help needed moving from lying on your back to sitting on the side of a flat bed without using bedrails?: A Lot Help needed moving to and from a bed to a chair (including a wheelchair)?: A Little Help needed standing up from a chair using your arms (e.g., wheelchair or bedside chair)?: A Little Help needed to walk in hospital room?: A Little Help needed climbing 3-5 steps with a railing? : A Little 6 Click Score: 17    End of Session Equipment Utilized During Treatment: Gait belt Activity Tolerance: Patient limited by fatigue;Patient limited by pain Patient left: in bed;with call bell/phone within reach;with bed alarm set Nurse Communication: Mobility status PT Visit Diagnosis: Unsteadiness on feet (R26.81);Muscle weakness (generalized) (M62.81);Dizziness and giddiness (R42);Pain Pain - Right/Left: Left Pain - part of body: Arm (ribs/ neck)     Time: 6153-7943 PT Time Calculation (min) (ACUTE ONLY): 23 min  Charges:  $Therapeutic Activity: 23-37 mins                     Bonney Leitz , PTA Acute Rehabilitation Services Pager [phone] Office [phone]     Allah Reason Artis Delay 08/08/2020, 5:16 PM

## 2020-08-09 ENCOUNTER — Inpatient Hospital Stay (HOSPITAL_COMMUNITY): Payer: Medicare Other

## 2020-08-09 DIAGNOSIS — R52 Pain, unspecified: Secondary | ICD-10-CM

## 2020-08-09 DIAGNOSIS — M7989 Other specified soft tissue disorders: Secondary | ICD-10-CM | POA: Diagnosis not present

## 2020-08-09 DIAGNOSIS — I82623 Acute embolism and thrombosis of deep veins of upper extremity, bilateral: Secondary | ICD-10-CM | POA: Diagnosis not present

## 2020-08-09 LAB — CBC
HCT: 31.7 % — ABNORMAL LOW (ref 36.0–46.0)
Hemoglobin: 9.9 g/dL — ABNORMAL LOW (ref 12.0–15.0)
MCH: 32.9 pg (ref 26.0–34.0)
MCHC: 31.2 g/dL (ref 30.0–36.0)
MCV: 105.3 fL — ABNORMAL HIGH (ref 80.0–100.0)
Platelets: 264 10*3/uL (ref 150–400)
RBC: 3.01 MIL/uL — ABNORMAL LOW (ref 3.87–5.11)
RDW: 12.5 % (ref 11.5–15.5)
WBC: 5.7 10*3/uL (ref 4.0–10.5)
nRBC: 0 % (ref 0.0–0.2)

## 2020-08-09 LAB — BASIC METABOLIC PANEL
Anion gap: 11 (ref 5–15)
BUN: 7 mg/dL — ABNORMAL LOW (ref 8–23)
CO2: 24 mmol/L (ref 22–32)
Calcium: 8.2 mg/dL — ABNORMAL LOW (ref 8.9–10.3)
Chloride: 105 mmol/L (ref 98–111)
Creatinine, Ser: 0.75 mg/dL (ref 0.44–1.00)
GFR, Estimated: >60 mL/min (ref >60)
Glucose, Bld: 88 mg/dL (ref 70–99)
Potassium: 4.6 mmol/L (ref 3.5–5.1)
Sodium: 140 mmol/L (ref 135–145)

## 2020-08-09 LAB — MAGNESIUM: Magnesium: 2.3 mg/dL (ref 1.7–2.4)

## 2020-08-09 MED ORDER — HEPARIN (PORCINE) 25000 UT/250ML-% IV SOLN
1550.0000 [IU]/h | INTRAVENOUS | Status: DC
  Administered 2020-08-09: 1000 [IU]/h via INTRAVENOUS
  Administered 2020-08-10: 1400 [IU]/h via INTRAVENOUS
  Administered 2020-08-11: 1550 [IU]/h via INTRAVENOUS
  Filled 2020-08-09 (×3): qty 250

## 2020-08-09 MED ORDER — HEPARIN BOLUS VIA INFUSION
1600.0000 [IU] | Freq: Once | INTRAVENOUS | Status: AC
  Administered 2020-08-09: 1600 [IU] via INTRAVENOUS
  Filled 2020-08-09: qty 1600

## 2020-08-09 MED ORDER — ACETAMINOPHEN 500 MG PO TABS
1000.0000 mg | ORAL_TABLET | Freq: Three times a day (TID) | ORAL | Status: DC
  Administered 2020-08-09 – 2020-08-12 (×8): 1000 mg via ORAL
  Filled 2020-08-09 (×8): qty 2

## 2020-08-09 NOTE — Progress Notes (Signed)
Occupational Therapy Treatment Patient Details Name: [name] MRN: [id number] DOB: 01/03/1956 Today's Date: 08/09/2020    History of present illness 65 y.o. female with fall resulting in C2 fracture and has left hand/finger fracture with splint in place.  Pt with new onset atrial flutter with rapid ventricular response as well as has UTI.  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   OT comments  Patient continues to porgress toward stated goals.  She is a little frustrated today, is not clear why she is still in the hospital, and wanting some information.  Nursing Tech notified.  Patient was able to place clean socks, and transition to the recliner for lunch.  Min A for basic mobility and continued verbal cues for cervical brace management and cervical precautions.  CIR has been recommended.  OT will continue to follow in the acute setting to maximize her functional status.    Follow Up Recommendations  CIR    Equipment Recommendations  Tub/shower seat;3 in 1 bedside commode    Recommendations for Other Services      Precautions / Restrictions Precautions Precautions: Fall;Cervical Required Braces or Orthoses: Cervical Brace Cervical Brace: Hard collar;At all times Splint/Cast: patient found with brace off while sleeping in bed, head flexed forward due to [facility] beng elevated.  Continued education regarding MD's rec's for brace on at all times. Restrictions Other Position/Activity Restrictions: "minimize weight bearing to what she can toleate" per Dr Rhona Leavens       Mobility Bed Mobility           Sit to supine: Supervision;HOB elevated      Transfers Overall transfer level: Needs assistance Equipment used: Rolling walker (2  wheeled) Transfers: Sit to/from [employer] Sit to Stand: Min guard Stand pivot transfers: Min assist            Balance           Standing balance support: Bilateral upper extremity supported;During functional activity Standing balance-Leahy Scale: Poor Standing balance comment: Reliant on UE support                           ADL either performed or assessed with clinical judgement   ADL Overall ADL's : Needs assistance/impaired Eating/Feeding: Set up;Sitting   Grooming: Wash/dry face;Wash/dry hands;Set up;Sitting               Lower Body Dressing: Min guard;Bed level               Functional mobility during ADLs: Minimal assistance;Rolling walker                         Cognition Arousal/Alertness: Awake/alert Behavior During Therapy: WFL for tasks assessed/performed Overall Cognitive Status: History of cognitive impairments - at baseline                                 General Comments: c-collar placed and adjusted for comfort.                          Pertinent Vitals/ Pain       Pain Assessment: No/denies pain Pain Intervention(s): Monitored during session  Frequency  Min 2X/week        Progress Toward Goals  OT Goals(current goals can now be found in the care plan section)  Progress towards OT goals: Progressing toward goals  Acute Rehab OT Goals Patient Stated Goal: get moving around again.  I'm ready to go home. OT Goal Formulation: With patient Time For Goal Achievement: 08/17/20  Plan Discharge plan remains appropriate    Co-evaluation                 AM-PAC OT "6 Clicks" Daily Activity     Outcome Measure   Help from another person eating meals?: A Little Help from another person taking care of personal grooming?: A Little Help from another person toileting, which includes  using toliet, bedpan, or urinal?: A Little Help from another person bathing (including washing, rinsing, drying)?: A Little Help from another person to put on and taking off regular upper body clothing?: A Little Help from another person to put on and taking off regular lower body clothing?: A Little 6 Click Score: 18    End of Session Equipment Utilized During Treatment: Cervical collar;Rolling walker;Oxygen  OT Visit Diagnosis: Pain;Unsteadiness on feet (R26.81);Other abnormalities of gait and mobility (R26.89);Muscle weakness (generalized) (M62.81)   Activity Tolerance Patient tolerated treatment well   Patient Left in chair;with call bell/phone within reach   Nurse Communication Other (comment) (IV connected but not on/running.)        Time: [id number] OT Time Calculation (min): 18 min  Charges: OT General Charges $OT Visit: 1 Visit OT Treatments $Self Care/Home Management : 8-22 mins  08/09/2020  Rich, OTR/L  Acute Rehabilitation Services  Office:  [phone]    Metta Clines 08/09/2020, 1:51 PM

## 2020-08-09 NOTE — Progress Notes (Signed)
Neurosurgery progress note  Received call from Dr. Waymon Amato regarding patient. Previously consulted a little over a week ago for C2 lateral mass fracture treated conservatively with Aspen collar. Now found to have bilateral DVT's. She has a remote history of AVM rupture 1989, Status post craniotomy and presumed resection. OK to start heparin drip from a neurosurgical perspective. Will plan on CT head in 24 hours for monitoring. Sh ould there be no evidence of intracranial hemorrhage, OK to transition to po anticoagulation.

## 2020-08-09 NOTE — Progress Notes (Signed)
Bilateral upper ext venous completed.  Spoke with Corporate treasurer with results.    Please see CV Proc for preliminary results.   Clint Guy, RVT

## 2020-08-09 NOTE — Progress Notes (Signed)
Inpatient Rehab Admissions Coordinator:   Received notice from [facility] that they would like to offer a peer to peer prior to rendering a determination on CIR authorization.  Spoke to Dr. Waymon Amato who states pt was just cleared to discontinue cardiac meds, as well as telemetry.  I agree that there is little medical necessity to support CIR admission at this point.  Notified [facility] of same and they will render decision shortly.  Note that request will likely be denied as pt appears to be medically stable, and could likely be managed in a lower level of care.  I will continue to follow for now, awaiting insurance final determination.    Estill Dooms, PT, DPT Admissions Coordinator [phone] 08/09/20  2:55 PM

## 2020-08-09 NOTE — Progress Notes (Addendum)
ANTICOAGULATION CONSULT NOTE - Initial Consult  Pharmacy Consult for IV Heparin Indication:  Bilateral UE DVT  Allergies  Allergen Reactions  . Zonisamide Other (See Comments)    Numbness and tingling over whole body  . Chocolate Flavor Other (See Comments)    Trigger for Seizure  . Codeine Nausea And Vomiting  . Keppra [Levetiracetam] Other (See Comments)    Causes seizures  . Pregabalin Nausea And Vomiting  . Olive Oil Rash    Patient Measurements: Height: 5\' 7"  (170.2 cm) Weight: 63 kg (138 lb 14.2 oz) IBW/kg (Calculated) : 61.6 Heparin Dosing Weight:  63 kg  Vital Signs: Temp: 97.9 F (36.6 C) (01/06 1250) Temp Source: Oral (01/06 1250) BP: 109/51 (01/06 1250) Pulse Rate: 65 (01/06 1250)  Labs: Recent Labs    08/07/20 0228 08/08/20 0108 08/09/20 0122  HGB  --  10.0* 9.9*  HCT  --  31.5* 31.7*  PLT  --  249 264  CREATININE 0.67 0.67 0.75    Estimated Creatinine Clearance: 70.9 mL/min (by C-G formula based on SCr of 0.75 mg/dL).   Medical History: Past Medical History:  Diagnosis Date  . Asthma   . Cerebral hemorrhage (HCC) 1989  . Chronic back pain   . COPD (chronic obstructive pulmonary disease) (HCC)   . DDD (degenerative disc disease) 06/12/2013  . DVT (deep venous thrombosis) (HCC)    "she's had several since 1990"  . GERD (gastroesophageal reflux disease)   . Headache    "usually around time when she's had a seizure"  . History of blood transfusion    "when she was a baby"  . History of stomach ulcers   . Kidney stones   . Neuropathy   . Seizures (HCC)    "because of her brain surgery"  . Stroke [facility]) [phone]   family denies residual on 11/09/2014  . Tunnel vision    "since brain OR"    Assessment: 31 yr woman was admitted on 08/02/20 following a mechanical fall. Pt now with acute bilateral UE DVT (brachial veins) and R acute SVT of basilic vein (likely precipitated by amiodarone extravasation, per provider note). Pharmacy is consulted to  dose IV heparin; per med rec, pt was on no anticoagulants PTA. Per primary team note, plan is to treat with IV heparin for 24-48 hrs, then switch to oral anticoagulation with apixaban.  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 However, no recent acute hemorrhage on head or neck CT. Neurosurgery recommends initiating IV heparin, closely monitoring, getting repeat head CT in 24 hrs, and if tolerates, transitioning to oral anticoagulation.  H/H 9.9/31.7, platelets 264  Goal of Therapy:  Heparin level 0.3-0.7 units/ml (will aim toward lower end of goal range given hx of remote ICH, until repeat CT done in 24 hrs Monitor platelets by anticoagulation protocol: Yes   Plan:  Heparin 1600 units IV bolus X 1 (giving half bolus, due to hx above), followed by heparin infusion at 1000 units/hr Check heparin level in 6 hrs Monitor daily heparin level, CBC Monitor for signs/symptoms of bleeding F/U repeat CT and transition to oral anticoagulant when able  06-25-1986, PharmD, BCPS, [facility] Clinical Pharmacist 08/09/2020,3:48 PM

## 2020-08-09 NOTE — Progress Notes (Signed)
PROGRESS NOTE    [name]  [id number] DOB: 07/26/1956 DOA: 08/02/2020 PCP: [name], MD    Brief Narrative:  65 y.o. 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  She does not know whether or not she tripped and fell but she thinks she passed out. Reports prior to this event she was having dysuria for a few days. She had another fall about a week prior to that and does not remember the circumstances around the fall.  Work-up in the ED revealed new onset A. fib with RVR, urine analysis positive for pyuria, C2 lateral mass fracture, left hand/finger fracture, possible left pyelonephritis on CT scan.  She was seen by neurosurgery, recommended conservative management, to wear a c-collar at all times. She now has a left upper extremity splint in place.  Cardiology was consulted to assist with new onset A. fib with RVR.  TRH was asked to admit for further work-up and management of present condition.  Assessment & Plan:   Active Problems:   Fall  Fall, unclear etiology -On further questioning, pt reports remembering events of falling and states "missing a step" up a staircase.  So this seems like a mechanical fall. -2d echo performed, findings of normal LVEF -Therapy recs for CIR when stable.  Upon my discussion with rehab coordinator today, she does not appear to be a CIR candidate anymore.  Moreover it appears that her insurance was declining CIR. -Per therapy, concerns of vertigo on ambulation with PT, meclizine ordered for symptom relief -Likely SNF when medically stable.  New  onset rapid aflutter No prior history of A. Fib Treated initially with Cardizem drip, however was later d/c secondary to hypotension 2d echo per above Cardiology following. Had been on amiodarone IV.  Metoprolol and digoxin which were added have also been stopped. Patient converted to sinus rhythm. Now on PO amiodarone.  As per cardiology follow-up 1/5, currently on amiodarone 200 mg twice daily but if no recurrence of atrial fibrillation by time of discharge then plan to stop amiodarone as well. CHA2DS2-VASc = 1 (female). Not felt to be a good candidate for anticoagulation given history of frequent seizures and history of right occipital AVM/intracranial hemorrhage as well as recent C2 fracture. I discussed with Dr. Sallyanne Kuster, Cardiology on 1/6, since patient has remained in sinus rhythm from 1/3, he indicates that it is less likely that she will have A. fib with RVR, advised to stop amiodarone and DC telemetry.  Same done.  Elevated troponin likely demand ischemia in the setting of A. fib with RVR Troponin peaked at 25 and trended down Suspect demand ischemia related to rapid heart rate 2d echo as per above Cardiology continues to follow Patient reported chest pain on 1/5.  To me she reported that this was on moving her right upper extremity but to Cardiology she was tender to palpation of chest wall, indicating musculoskeletal pain.  No further work-up recommended.  Hypotension Patient had episode of hypotension on 1/3 which improved with IV fluids.  Lopressor was discontinued.  Hypokalemia/hypomagnesemia Replaced and follow.  Acute bilateral upper extremity DVT (brachial veins) and right acute SVT of basilic  vein.  Right> left upper extremity pain Likely precipitated by IV amiodarone infiltration. Low likelihood for cellulitis and empirically started IV Ancef was discontinued. Despite supportive care continue to report pain and swelling, right greater than sign left. Obtained venous  Dopplers which confirms above. Discussed with Dr. Sallyanne Kuster, Cardiology.  Has history of ICH.  However no recent acute hemorrhage on head or neck CT.  Reasonable to treat with IV heparin for 24 to 48 hours and then oral anticoagulation with Eliquis. Discussed with Neurosurgery, Mr. [name], PA-C discussed with his attending MD on call: It appears that patient had an [place] from AVM and underwent craniotomy with hematoma evacuation in 1989.  They recommend initiation of IV heparin, closely monitoring, getting repeat head CT in 24 hours and if tolerates then can transition to oral anticoagulation.  Pan-sensitive ecoli UTI with suspected pyelonephritis on CT scan History of ESBL E. coli in 2019 Presented with urine analysis positive for pyuria urine culture pos for pan-sensitive ecoli Initially started on meropenem, now on rocephin No leukocytosis Completed course of abx for UTI  C2 left lateral mass fracture post fall Seen by neurosurgery Recommended treatment with aspen c-collar at all times.  As per neurosurgery, currently has a [place] collar and will order a Aspen collar which is more comfortable.  Neurosurgery will arrange outpatient follow-up. Cont with analgesia as needed Outpatient follow-up with neurosurgery.  Left hand/finger fracture post fall She currently has a splint in place Continue with analgesia with bowel regimen  Prolonged QTC Presented with QTC of 545.  Repeat EKG 12/31: QTC 495 ms Avoid QTC prolonging agents Anticipate keeping K>4 and Mg>2  Seizure disorder Subtherapeutic level of Dilantin, 2.9 Not postictal at the time of this visit Resume home regimen, she is on several seizure medications Pharmacy assisting with dosage Seizure precautions. Cont to be seizure free  Polyneuropathy Resume home Neurontin  Chronic anxiety/depression Stable She is on escitalopram  Hyperglycemia likely stress-induced in the setting of fractures Hgb A1c 5.0 Cont to  monitor for now  Prior tobacco abuse history -Reported long hx of prior tobacco abuse -Pt elected to quit smoking 4 years ago and remains tobacco free -Pt congratulated  COVID exposure -Over weekend, pt was visited by her son who has since tested pos for COVID -Given recent fevers, have placed pt on isolation -Repeat Covid testing negative on 1/4    DVT prophylaxis: SCD's Code Status: Full Family Communication: Pt in room, family not at bedside  Status is: Inpatient  Remains inpatient appropriate because:IV treatments appropriate due to intensity of illness or inability to take PO and Inpatient level of care appropriate due to severity of illness  Dispo:  Patient From: Home  Planned Disposition: Likely SNF  Expected discharge date: 08/10/2020  Medically stable for discharge: No   Consultants:   Cardiology  Neurosurgery  Procedures:   [place] collar >Aspen collar  Antimicrobials: Anti-infectives (From admission, onward)   Start     Dose/Rate Route Frequency Ordered Stop   08/07/20 1400  ceFAZolin (ANCEF) IVPB 1 g/50 mL premix  Status:  Discontinued        1 g 100 mL/hr over 30 Minutes Intravenous Every 8 hours 08/07/20 1138 08/08/20 1805   08/04/20 1700  cefTRIAXone (ROCEPHIN) 1 g in sodium chloride 0.9 % 100 mL IVPB  Status:  Discontinued       Note to Pharmacy: Pan-sensitive UTI on urine cx   1 g 200 mL/hr over 30 Minutes Intravenous Every 24 hours 08/04/20 1600 08/04/20  1601   08/04/20 1700  cefTRIAXone (ROCEPHIN) 1 g in sodium chloride 0.9 % 100 mL IVPB       Note to Pharmacy: Pan-sensitive UTI on urine cx   1 g 200 mL/hr over 30 Minutes Intravenous Every 24 hours 08/04/20 1601 08/05/20 1721   08/03/20 1500  cefTRIAXone (ROCEPHIN) 2 g in sodium chloride 0.9 % 100 mL IVPB  Status:  Discontinued        2 g 200 mL/hr over 30 Minutes Intravenous Every 24 hours 08/02/20 1547 08/02/20 1557   08/02/20 1730  meropenem (MERREM) 1 g in sodium chloride 0.9 % 100 mL  IVPB  Status:  Discontinued        1 g 200 mL/hr over 30 Minutes Intravenous Every 8 hours 08/02/20 1641 08/04/20 1600   08/02/20 1500  cefTRIAXone (ROCEPHIN) 1 g in sodium chloride 0.9 % 100 mL IVPB        1 g 200 mL/hr over 30 Minutes Intravenous  Once 08/02/20 1458 08/02/20 1628      Subjective: Patient with ongoing pain and swelling in right greater than sign left upper extremity.  Also reports generalized pain.  No tingling or numbness reported.  Objective: Vitals:   08/09/20 0112 08/09/20 0630 08/09/20 0939 08/09/20 1250  BP: 123/60 (!) 113/56 (!) 109/59 (!) 109/51  Pulse: 64 63 62 65  Resp: 20 20 14 20   Temp: 98.7 F (37.1 C) 98.6 F (37 C)  97.9 F (36.6 C)  TempSrc: Oral Oral Oral Oral  SpO2: 98% 99% 100% 95%  Weight:      Height:        Intake/Output Summary (Last 24 hours) at 08/09/2020 1526 Last data filed at 08/09/2020 0943 Gross per 24 hour  Intake 240 ml  Output 1600 ml  Net -1360 ml   Filed Weights   08/02/20 1600  Weight: 63 kg    Examination: General exam: Middle-age female, moderately built and frail, lying supine in bed.  Was actually sleeping comfortably when I went into the room and woke her up. Neck: Has [place] cervical collar in place. Respiratory system: Clear to auscultation.  No increased work of breathing. Cardiovascular system: S1 and S2 heard, RRR.  No JVD, murmurs or pedal edema.  Telemetry personally reviewed: Sinus rhythm.  Last was in A. fib on 1/3. Gastrointestinal system: Soft, nondistended, positive BS Central nervous system: CN2-12 grossly intact, strength intact Extremities: Perfused, no clubbing, diffuse swelling of right upper extremity.  Some induration on medial aspect of left forearm and upper arm.  Diffuse tenderness.  No erythema or increased warmth.  Bounding right radial pulse.  Good distal capillary refill.  Motor function intact.  Mild left upper extremity swelling.  No significant change in findings compared to  yesterday.  However it does not appear that patient was consistently keeping her right upper extremity elevated. Skin: Normal skin turgor, no notable skin lesions seen Psychiatry: Mood normal // no visual hallucinations   Data Reviewed: I have personally reviewed following labs and imaging studies  CBC: Recent Labs  Lab 08/03/20 0001 08/04/20 0703 08/08/20 0108 08/09/20 0122  WBC 6.3 4.8 7.1 5.7  NEUTROABS  --  3.5  --   --   HGB 10.8* 10.9* 10.0* 9.9*  HCT 32.8* 33.9* 31.5* 31.7*  MCV 102.5* 103.0* 103.6* 105.3*  PLT 110* 134* 249 [id number]   Basic Metabolic Panel: Recent Labs  Lab 08/05/20 0407 08/06/20 0053 08/07/20 0228 08/08/20 0108 08/09/20 0122  NA 139 138 140  141 140  K 3.6 4.1 3.7 3.4* 4.6  CL 106 104 105 103 105  CO2 26 23 26 28 24   GLUCOSE 115* 138* 106* 105* 88  BUN <5* <5* <5* <5* 7*  CREATININE 0.65 0.73 0.67 0.67 0.75  CALCIUM 8.0* 7.7* 8.3* 8.1* 8.2*  MG 1.6* 2.1 1.8 1.8 2.3   GFR: Estimated Creatinine Clearance: 70.9 mL/min (by C-G formula based on SCr of 0.75 mg/dL). Liver Function Tests: Recent Labs  Lab 08/03/20 0001 08/05/20 0407 08/06/20 0053 08/07/20 0228 08/08/20 0108  AST 23 20 26 22 20   ALT 16 16 18 18 17   ALKPHOS 46 40 45 48 52  BILITOT 0.5 0.4 0.5 0.5 0.4  PROT 4.8* 4.7* 4.3* 5.0* 5.1*  ALBUMIN 2.1* 2.0* 1.8* 2.0* 2.0*     Recent Results (from the past 240 hour(s))  Resp Panel by RT-PCR (Flu A&B, Covid) Nasopharyngeal Swab     Status: None   Collection Time: 08/02/20 11:56 AM   Specimen: Nasopharyngeal Swab; Nasopharyngeal(NP) swabs in vial transport medium  Result Value Ref Range Status   SARS Coronavirus 2 by RT PCR NEGATIVE NEGATIVE Final    Comment: (NOTE) SARS-CoV-2 target nucleic acids are NOT DETECTED.  The SARS-CoV-2 RNA is generally detectable in upper respiratory specimens during the acute phase of infection. The lowest concentration of SARS-CoV-2 viral copies this assay can detect is 138 copies/mL. A negative result  does not preclude SARS-Cov-2 infection and should not be used as the sole basis for treatment or other patient management decisions. A negative result may occur with  improper specimen collection/handling, submission of specimen other than nasopharyngeal swab, presence of viral mutation(s) within the areas targeted by this assay, and inadequate number of viral copies(<138 copies/mL). A negative result must be combined with clinical observations, patient history, and epidemiological information. The expected result is Negative.  Fact Sheet for Patients:  EntrepreneurPulse.com.au  Fact Sheet for Healthcare Providers:  IncredibleEmployment.be  This test is no t yet approved or cleared by the [place] FDA and  has been authorized for detection and/or diagnosis of SARS-CoV-2 by FDA under an Emergency Use Authorization (EUA). This EUA will remain  in effect (meaning this test can be used) for the duration of the COVID-19 declaration under Section 564(b)(1) of the Act, 21 U.S.C.section 360bbb-3(b)(1), unless the authorization is terminated  or revoked sooner.       Influenza A by PCR NEGATIVE NEGATIVE Final   Influenza B by PCR NEGATIVE NEGATIVE Final    Comment: (NOTE) The Xpert Xpress SARS-CoV-2/FLU/RSV plus assay is intended as an aid in the diagnosis of influenza from Nasopharyngeal swab specimens and should not be used as a sole basis for treatment. Nasal washings and aspirates are unacceptable for Xpert Xpress SARS-CoV-2/FLU/RSV testing.  Fact Sheet for Patients: EntrepreneurPulse.com.au  Fact Sheet for Healthcare Providers: IncredibleEmployment.be  This test is not yet approved or cleared by the [place] FDA and has been authorized for detection and/or diagnosis of SARS-CoV-2 by FDA under an Emergency Use Authorization (EUA). This EUA will remain in effect (meaning this test can be used) for  the duration of the COVID-19 declaration under Section 564(b)(1) of the Act, 21 U.S.C. section 360bbb-3(b)(1), unless the authorization is terminated or revoked.  Performed at [facility], [place] [address], [place], [place] [address]   Urine culture     Status: Abnormal   Collection Time: 08/02/20  2:00 PM   Specimen: Urine, Random  Result Value Ref Range Status  Specimen Description URINE, RANDOM  Final   Special Requests   Final    NONE Performed at [facility], [address], [place], [place] [address]    Culture >=100,000 COLONIES/mL ESCHERICHIA COLI (A)  Final   Report Status 08/04/2020 FINAL  Final   Organism ID, Bacteria ESCHERICHIA COLI (A)  Final      Susceptibility   Escherichia coli - MIC*    AMPICILLIN <=2 SENSITIVE Sensitive     CEFAZOLIN <=4 SENSITIVE Sensitive     CEFEPIME <=0.12 SENSITIVE Sensitive     CEFTRIAXONE <=0.25 SENSITIVE Sensitive     CIPROFLOXACIN <=0.25 SENSITIVE Sensitive     GENTAMICIN <=1 SENSITIVE Sensitive     IMIPENEM <=0.25 SENSITIVE Sensitive     NITROFURANTOIN <=16 SENSITIVE Sensitive     TRIMETH/SULFA <=20 SENSITIVE Sensitive     AMPICILLIN/SULBACTAM <=2 SENSITIVE Sensitive     PIP/TAZO <=4 SENSITIVE Sensitive     * >=100,000 COLONIES/mL ESCHERICHIA COLI  Culture, blood (routine x 2)     Status: None   Collection Time: 08/02/20 11:20 PM   Specimen: BLOOD RIGHT HAND  Result Value Ref Range Status   Specimen Description BLOOD RIGHT HAND  Final   Special Requests   Final    BOTTLES DRAWN AEROBIC AND ANAEROBIC Blood Culture adequate volume   Culture   Final    NO GROWTH 5 DAYS Performed at [facility], [address] [place], [place] [address]    Report Status 08/08/2020 FINAL  Final  Culture, blood (routine x 2)     Status: None   Collection Time: 08/02/20 11:35 PM   Specimen: BLOOD  Result Value Ref Range Status   Specimen Description BLOOD RT THUMB  Final   Special Requests AEROBIC BOTTLE ONLY Blood Culture  adequate volume  Final   Culture   Final    NO GROWTH 5 DAYS Performed at [facility], [address], [place], [place] [address]    Report Status 08/08/2020 FINAL  Final  Culture, blood (routine x 2)     Status: None (Preliminary result)   Collection Time: 08/07/20  1:02 PM   Specimen: BLOOD LEFT HAND  Result Value Ref Range Status   Specimen Description BLOOD LEFT HAND  Final   Special Requests   Final    BOTTLES DRAWN AEROBIC AND ANAEROBIC Blood Culture results may not be optimal due to an inadequate volume of blood received in culture bottles   Culture   Final    NO GROWTH 2 DAYS Performed at [facility], [address], [place], [place] [address]    Report Status PENDING  Incomplete  Culture, blood (routine x 2)     Status: None (Preliminary result)   Collection Time: 08/07/20  1:03 PM   Specimen: BLOOD LEFT HAND  Result Value Ref Range Status   Specimen Description BLOOD LEFT HAND  Final   Special Requests   Final    BOTTLES DRAWN AEROBIC AND ANAEROBIC Blood Culture results may not be optimal due to an inadequate volume of blood received in culture bottles   Culture   Final    NO GROWTH 2 DAYS Performed at [facility], [address] [place], [place] [address]    Report Status PENDING  Incomplete  Resp Panel by RT-PCR (Flu A&B, Covid) Nasopharyngeal Swab     Status: None   Collection Time: 08/07/20  3:49 PM   Specimen: Nasopharyngeal Swab; Nasopharyngeal(NP) swabs in  vial transport medium  Result Value Ref Range Status   SARS Coronavirus 2 by RT PCR NEGATIVE NEGATIVE Final    Comment: (NOTE) SARS-CoV-2 target nucleic acids are NOT DETECTED.  The SARS-CoV-2 RNA is generally detectable in upper respiratory specimens during the acute phase of infection. The lowest concentration of SARS-CoV-2 viral copies this assay can detect is 138 copies/mL. A negative result does not preclude SARS-Cov-2 infection and should not be used as the sole basis for  treatment or other patient management decisions. A negative result may occur with  improper specimen collection/handling, submission of specimen other than nasopharyngeal swab, presence of viral mutation(s) within the areas targeted by this assay, and inadequate number of viral copies(<138 copies/mL). A negative result must be combined with clinical observations, patient history, and epidemiological information. The expected result is Negative.  Fact Sheet for Patients:  EntrepreneurPulse.com.au  Fact Sheet for Healthcare Providers:  IncredibleEmployment.be  This test is no t yet approved or cleared by the [place] FDA and  has been authorized for detection and/or diagnosis of SARS-CoV-2 by FDA under an Emergency Use Authorization (EUA). This EUA will remain  in effect (meaning this test can be used) for the duration of the COVID-19 declaration under Section 564(b)(1) of the Act, 21 U.S.C.section 360bbb-3(b)(1), unless the authorization is terminated  or revoked sooner.       Influenza A by PCR NEGATIVE NEGATIVE Final   Influenza B by PCR NEGATIVE NEGATIVE Final    Comment: (NOTE) The Xpert Xpress SARS-CoV-2/FLU/RSV plus assay is intended as an aid in the diagnosis of influenza from Nasopharyngeal swab specimens and should not be used as a sole basis for treatment. Nasal washings and aspirates are unacceptable for Xpert Xpress SARS-CoV-2/FLU/RSV testing.  Fact Sheet for Patients: EntrepreneurPulse.com.au  Fact Sheet for Healthcare Providers: IncredibleEmployment.be  This test is not yet approved or cleared by the [place] FDA and has been authorized for detection and/or diagnosis of SARS-CoV-2 by FDA under an Emergency Use Authorization (EUA). This EUA will remain in effect (meaning this test can be used) for the duration of the COVID-19 declaration under Section 564(b)(1) of the Act, 21  U.S.C. section 360bbb-3(b)(1), unless the authorization is terminated or revoked.  Performed at [facility], [place] [address] [place], [place] [address]      Radiology Studies: VAS [place] UPPER EXTREMITY VENOUS DUPLEX  Result Date: 08/09/2020 UPPER VENOUS STUDY  Indications: Pain, and Swelling Other Indications: Previous PICC. Risk Factors: DVT 2014 LLE DVT. Comparison Study: No previous Upper ext duplex. Performing Technologist: Vonzell Schlatter RVT  Examination Guidelines: A complete evaluation includes B-mode imaging, spectral Doppler, color Doppler, and power Doppler as needed of all accessible portions of each vessel. Bilateral testing is considered an integral part of a complete examination. Limited examinations for reoccurring indications may be performed as noted.  Right Findings: +----------+------------+---------+-----------+----------+-------+ RIGHT     CompressiblePhasicitySpontaneousPropertiesSummary +----------+------------+---------+-----------+----------+-------+ IJV           Full       Yes       Yes                      +----------+------------+---------+-----------+----------+-------+ Subclavian    Full       Yes       Yes                      +----------+------------+---------+-----------+----------+-------+ Axillary      Full  Yes       Yes                      +----------+------------+---------+-----------+----------+-------+ Brachial      None                                          +----------+------------+---------+-----------+----------+-------+ Radial        Full                                          +----------+------------+---------+-----------+----------+-------+ Ulnar         Full                                          +----------+------------+---------+-----------+----------+-------+ Cephalic      Full                                          +----------+------------+---------+-----------+----------+-------+  Basilic       None                                          +----------+------------+---------+-----------+----------+-------+  Left Findings: +----------+------------+---------+-----------+----------+-------+ LEFT      CompressiblePhasicitySpontaneousPropertiesSummary +----------+------------+---------+-----------+----------+-------+ IJV           Full       Yes       Yes                      +----------+------------+---------+-----------+----------+-------+ Subclavian    Full       Yes       Yes                      +----------+------------+---------+-----------+----------+-------+ Axillary      Full       Yes       Yes                      +----------+------------+---------+-----------+----------+-------+ Brachial      None                                          +----------+------------+---------+-----------+----------+-------+ Radial        Full                                          +----------+------------+---------+-----------+----------+-------+ Ulnar         Full                                          +----------+------------+---------+-----------+----------+-------+ Cephalic      Full                                          +----------+------------+---------+-----------+----------+-------+  Basilic       Full                                          +----------+------------+---------+-----------+----------+-------+  Summary:  Right: Findings consistent with acute deep vein thrombosis involving the right brachial veins. Findings consistent with acute superficial vein thrombosis involving the right basilic vein. Duplicate brachial system with both being non-compressible.  Left: Findings consistent with acute deep vein thrombosis involving the left brachial veins. Duplicate brachial system with one being compressible and one is not.  *See table(s) above for measurements and observations.    Preliminary     Scheduled Meds: .  acetaminophen  1,000 mg Oral TID  . Cenobamate  100 mg Oral Daily  . cholecalciferol  1,000 Units Oral Daily  . escitalopram  10 mg Oral Daily  . gabapentin  200 mg Oral QHS  . lacosamide  200 mg Oral BID  . multivitamin with minerals  1 tablet Oral Daily  . phenytoin  100 mg Oral Daily   And  . phenytoin  200 mg Oral QHS  . senna-docusate  2 tablet Oral QHS  . sodium chloride flush  10-40 mL Intracatheter Q12H  . vitamin B-12  1,000 mcg Oral Daily   Continuous Infusions:    LOS: 7 days   Marcellus Scott, MD, [place], [facility]  To contact the attending provider between 7A-7P or the covering provider during after hours 7P-7A, please log into the web site [email or web address] and access using universal [place] password for that web site. If you do not have the password, please call the hospital operator.

## 2020-08-09 NOTE — Plan of Care (Signed)
Pt. Currently resting in bed in no acute distress. Right arm swollen and red and painful due to suspected infiltration. Pt educated to elevate arm on pillows about heart level. Pain medication given with effective results. No seizure activity noted. Pt is sinus rhythm on monitor. Brynda Rim, RN

## 2020-08-10 ENCOUNTER — Inpatient Hospital Stay (HOSPITAL_COMMUNITY): Payer: Medicare Other

## 2020-08-10 DIAGNOSIS — I82623 Acute embolism and thrombosis of deep veins of upper extremity, bilateral: Secondary | ICD-10-CM | POA: Diagnosis not present

## 2020-08-10 LAB — CBC
HCT: 33.9 % — ABNORMAL LOW (ref 36.0–46.0)
Hemoglobin: 10.8 g/dL — ABNORMAL LOW (ref 12.0–15.0)
MCH: 32.9 pg (ref 26.0–34.0)
MCHC: 31.9 g/dL (ref 30.0–36.0)
MCV: 103.4 fL — ABNORMAL HIGH (ref 80.0–100.0)
Platelets: 253 10*3/uL (ref 150–400)
RBC: 3.28 MIL/uL — ABNORMAL LOW (ref 3.87–5.11)
RDW: 12.3 % (ref 11.5–15.5)
WBC: 5.1 10*3/uL (ref 4.0–10.5)
nRBC: 0 % (ref 0.0–0.2)

## 2020-08-10 LAB — HEPARIN LEVEL (UNFRACTIONATED)
Heparin Unfractionated: <0.1 IU/mL — ABNORMAL LOW (ref 0.30–0.70)
Heparin Unfractionated: 0.16 IU/mL — ABNORMAL LOW (ref 0.30–0.70)
Heparin Unfractionated: 0.26 IU/mL — ABNORMAL LOW (ref 0.30–0.70)

## 2020-08-10 MED ORDER — HEPARIN BOLUS VIA INFUSION
1500.0000 [IU] | Freq: Once | INTRAVENOUS | Status: AC
  Administered 2020-08-10: 1500 [IU] via INTRAVENOUS
  Filled 2020-08-10: qty 1500

## 2020-08-10 NOTE — Progress Notes (Signed)
Phenytoin Follow-Up Consult Indication: Seizures  Allergies  Allergen Reactions  . Zonisamide Other (See Comments)    Numbness and tingling over whole body  . Chocolate Flavor Other (See Comments)    Trigger for Seizure  . Codeine Nausea And Vomiting  . Keppra [Levetiracetam] Other (See Comments)    Causes seizures  . Pregabalin Nausea And Vomiting  . Olive Oil Rash    Patient Measurements: Height: 5\' 7"  (170.2 cm) Weight: 63 kg (138 lb 14.2 oz) IBW/kg (Calculated) : 61.6 TPN AdjBW (KG): 63 Body mass index is 21.75 kg/m.   Vital signs: Temp: 98 F (36.7 C) (01/07 0340) Temp Source: Oral (01/07 0340) BP: 130/60 (01/07 0340)  Labs: No results found for: ALBUMIN, PHENYTOIN, PHENYTFREE Lab Results  Component Value Date   PHENYTOIN 4.9 (L) 08/07/2020   VALPROATE 28.4 (L) 06/18/2013   Estimated Creatinine Clearance: 70.9 mL/min (by C-G formula based on SCr of 0.75 mg/dL).   Medications:  Scheduled:  . acetaminophen  1,000 mg Oral TID  . Cenobamate  100 mg Oral Daily  . cholecalciferol  1,000 Units Oral Daily  . escitalopram  10 mg Oral Daily  . gabapentin  200 mg Oral QHS  . lacosamide  200 mg Oral BID  . multivitamin with minerals  1 tablet Oral Daily  . phenytoin  100 mg Oral Daily   And  . phenytoin  200 mg Oral QHS  . senna-docusate  2 tablet Oral QHS  . sodium chloride flush  10-40 mL Intracatheter Q12H  . vitamin B-12  1,000 mcg Oral Daily    Assessment: Pt on phenytoin PTA for hx seizures. Recent corrected phenytoin lvl was 9.8 so no changes made. Will continue current regimen and recheck levels next week.  Goals of care:  Total phenytoin level: 10-20 mcg/ml Free phenytoin level: 1-2 mcg/ml  Plan:  -Continue phenytoin 100mg  qAM + 200mg  qHS   Arrie Senate, PharmD, BCPS, [facility] Clinical Pharmacist [phone] Please check AMION for all [place] numbers 08/10/2020

## 2020-08-10 NOTE — Progress Notes (Signed)
Inpatient Rehab Admissions Coordinator:   Notified by insurance this morning that request for CIR has been denied.  Will sign off for CIR and let TOC team know.   Shann Medal, PT, DPT Admissions Coordinator [phone] 08/10/20  9:29 AM

## 2020-08-10 NOTE — Progress Notes (Addendum)
PROGRESS NOTE    [name]  [id number] DOB: 1956/06/07 DOA: 08/02/2020 PCP: [name] Gravel, MD    Brief Narrative:  66 y.o. 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  She does not know whether or not she tripped and fell but she thinks she passed out. Reports prior to this event she was having dysuria for a few days. She had another fall about a week prior to that and does not remember the circumstances around the fall.  Work-up in the ED revealed new onset A. fib with RVR, urine analysis positive for pyuria, C2 lateral mass fracture, left hand/finger fracture, possible left pyelonephritis on CT scan.  She was seen by neurosurgery, recommended conservative management, to wear a c-collar at all times. She now has a left upper extremity splint in place.  Cardiology was consulted to assist with new onset A. fib with RVR.  TRH was asked to admit for further work-up and management of present condition.  Reverted to sinus rhythm, amiodarone discontinued.  Course complicated by acute bilateral upper extremity DVTs, IV heparin initiated.  Assessment & Plan:   Active Problems:   Fall  Fall, unclear etiology -On further questioning, pt reports remembering events of falling and states "missing a step" up a staircase.  So this seems like a mechanical fall. -2d echo performed, findings of normal LVEF -Insurance denied CIR.  Also per CIR coordinator, did not meet criteria for CIR -SNF when medically stable.  Patient however reluctant, definitely does not want to go to [facility].  Prefers to  go home.  Advised her that returning home prematurely may be a fall risk with complications.  She verbalized understanding  New onset rapid aflutter No prior history of A. Fib Treated with Cardizem drip, IV amiodarone, metoprolol and digoxin, now discontinued.   2d echo per above Patient converted to sinus rhythm and has remained in sinus rhythm since 1/3. CHA2DS2-VASc = 1 (female). Not felt to be a good candidate for anticoagulation given history of frequent seizures and history of right occipital AVM/intracranial hemorrhage as well as recent C2 fracture.  Now on IV heparin for upper extremity acute DVTs She was briefly on oral amiodarone to be continued while hospitalized and to DC at discharge.  I discussed with Dr. Sallyanne Kuster, Cardiology on 1/6, since patient has remained in sinus rhythm from 1/3, he indicated that it is less likely that she will have A. fib with RVR, advised to stop amiodarone and DC telemetry-order written but remains on telemetry-updated RN. Cardiology will arrange outpatient follow-up.  Elevated troponin likely demand ischemia in the setting of A. fib with RVR Troponin peaked at 25 and trended down Suspect demand ischemia related to rapid heart rate 2d echo as per above Cardiology signed off No recurrent chest pain.  Hypotension Resolved  Hypokalemia/hypomagnesemia Replaced and follow.  Acute bilateral upper extremity DVT (brachial veins) and right acute SVT of basilic vein.  Right> left upper extremity pain Likely precipitated by IV amiodarone infiltration. Low likelihood for cellulitis and empirically started IV Ancef was discontinued. Despite supportive care continued to report pain and swelling, right > left. Obtained venous Dopplers which  confirms above. Discussed with Dr. Sallyanne Kuster, Cardiology 1/6.  Has history of ICH.  However no recent acute hemorrhage on head or neck CT.  Reasonable to treat with IV heparin for 24 to 48 hours and then oral anticoagulation  with Eliquis. Discussed with Neurosurgery, Mr. [name], PA-C 1/6 who discussed with his attending MD on call: It appears that patient had an [name] from AVM and underwent craniotomy with hematoma evacuation in 1989.  They recommend initiation of IV heparin, closely monitoring, getting repeat head CT in 24 hours (yet to be done) and if tolerates then can transition to oral anticoagulation.  Pan-sensitive ecoli UTI with suspected pyelonephritis on CT scan History of ESBL E. coli in 2019 Presented with urine analysis positive for pyuria urine culture pos for pan-sensitive ecoli Initially started on meropenem, now on rocephin No leukocytosis Completed course of abx for UTI  C2 left lateral mass fracture post fall Seen by neurosurgery Recommended treatment with aspen c-collar at all times.  As per neurosurgery, currently has a Philadelphia collar and ordered a Aspen collar which is more comfortable.  Neurosurgery will arrange outpatient follow-up. Cont with analgesia as needed Outpatient follow-up with neurosurgery.  Left hand/finger fracture post fall She currently has a splint in place Continue with analgesia with bowel regimen Outpatient follow-up with hand surgery  Prolonged QTC Presented with QTC of 545.  Repeat EKG 12/31: QTC 495 ms Avoid QTC prolonging agents Anticipate keeping K>4 and Mg>2  Seizure disorder Subtherapeutic level of Dilantin, 2.9 Not postictal at the time of this visit Resume home regimen, she is on several seizure medications Pharmacy assisting with dosage Seizure precautions. Cont to be seizure free  Polyneuropathy Resume home Neurontin  Chronic anxiety/depression Stable She is on escitalopram  Hyperglycemia likely stress-induced in the setting of fractures Hgb A1c 5.0 Cont to monitor for now  Prior tobacco abuse history -Reported long hx of prior tobacco abuse -Pt elected to quit smoking 4 years ago and remains tobacco free -Pt  congratulated  COVID exposure -Over weekend, pt was visited by her son who has since tested pos for COVID -Given recent fevers, have placed pt on isolation -Repeat Covid testing negative on 1/4    DVT prophylaxis: SCD's Code Status: Full Family Communication: I discussed in detail with patient's daughter via phone, updated care and answered all questions.  Daughter indicates that she works from home and is available 24/7 to take care of patient just like she takes care of her 95 year old grandmother.  They seem to have all the necessary DME at home.  Apparently nursing and therapist were coming at home and she indicated that they were getting better rehab at home then in some of the SNF that she has been in the past.  I advised her to call and speak with the physical therapist tomorrow regarding this information and then see if she would be safe to return home with home health or would still need to go to SNF.  She verbalized understanding  Status is: Inpatient  Remains inpatient appropriate because:IV treatments appropriate due to intensity of illness or inability to take PO and Inpatient level of care appropriate due to severity of illness  Dispo:  Patient From: Home  Planned Disposition: Likely SNF  Expected discharge date: 08/13/2020  Medically stable for discharge: No   Consultants:   Cardiology  Neurosurgery  Procedures:   Philadelphia collar >Aspen collar  Antimicrobials: Anti-infectives (From admission, onward)   Start     Dose/Rate Route Frequency Ordered Stop  08/07/20 1400  ceFAZolin (ANCEF) IVPB 1 g/50 mL premix  Status:  Discontinued        1 g 100 mL/hr over 30 Minutes Intravenous Every 8 hours 08/07/20 1138 08/08/20 1805   08/04/20 1700  cefTRIAXone (ROCEPHIN) 1 g in sodium chloride 0.9 % 100 mL IVPB  Status:  Discontinued       Note to Pharmacy: Pan-sensitive UTI on urine cx   1 g 200 mL/hr over 30 Minutes Intravenous Every 24 hours 08/04/20 1600 08/04/20  1601   08/04/20 1700  cefTRIAXone (ROCEPHIN) 1 g in sodium chloride 0.9 % 100 mL IVPB       Note to Pharmacy: Pan-sensitive UTI on urine cx   1 g 200 mL/hr over 30 Minutes Intravenous Every 24 hours 08/04/20 1601 08/05/20 1721   08/03/20 1500  cefTRIAXone (ROCEPHIN) 2 g in sodium chloride 0.9 % 100 mL IVPB  Status:  Discontinued        2 g 200 mL/hr over 30 Minutes Intravenous Every 24 hours 08/02/20 1547 08/02/20 1557   08/02/20 1730  meropenem (MERREM) 1 g in sodium chloride 0.9 % 100 mL IVPB  Status:  Discontinued        1 g 200 mL/hr over 30 Minutes Intravenous Every 8 hours 08/02/20 1641 08/04/20 1600   08/02/20 1500  cefTRIAXone (ROCEPHIN) 1 g in sodium chloride 0.9 % 100 mL IVPB        1 g 200 mL/hr over 30 Minutes Intravenous  Once 08/02/20 1458 08/02/20 1628      Subjective: Feels better.  States that right upper extremity pain and swelling are somewhat better compared to yesterday and able to move better as well.  No bleeding reported.  Denies any other complaints.  Prefers to go home rather than SNF.  States that she lives with her daughter and son-in-law and has 24/7 supervision at home  Objective: Vitals:   08/09/20 2300 08/09/20 2342 08/10/20 0340 08/10/20 1228  BP:   130/60 117/63  Pulse:    73  Resp: 13 14 20 18   Temp:  98.2 F (36.8 C) 98 F (36.7 C) 98 F (36.7 C)  TempSrc:  Oral Oral Oral  SpO2:  96% 95% 99%  Weight:      Height:        Intake/Output Summary (Last 24 hours) at 08/10/2020 1233 Last data filed at 08/10/2020 1214 Gross per 24 hour  Intake 386.6 ml  Output 1400 ml  Net -1013.4 ml   Filed Weights   08/02/20 1600  Weight: 63 kg    Examination: General exam: Middle-age female, moderately built and frail, lying supine in bed.  Looks better than she did for the last 2 days. Neck: Has Philadelphia cervical collar in place.   Respiratory system: Clear to auscultation.  No increased work of breathing. Cardiovascular system: S1 and S2 heard, RRR.   No JVD, murmurs or pedal edema.  Telemetry personally reviewed: SB in the 50s-SR. Gastrointestinal system: Nondistended, soft and nontender.  No organomegaly or masses appreciated.  Normal bowel sounds heard. Central nervous system: Alert and oriented.  No focal neurological deficits. Extremities: Diffuse right upper extremity swelling with induration on medial aspect of left upper arm and left forearm with associated tenderness, slightly less compared to yesterday.  No significant erythema or warmth.  No fluctuation or crepitus.  Good radial pulses felt and normal capillary refill.  Normal motor function.  No clinical concern for compartment syndrome.  Left upper extremity mild diffuse  swelling. Skin: Normal skin turgor, no notable skin lesions seen Psychiatry: Mood normal // no visual hallucinations   Data Reviewed: I have personally reviewed following labs and imaging studies  CBC: Recent Labs  Lab 08/04/20 0703 08/08/20 0108 08/09/20 0122 08/10/20 0325  WBC 4.8 7.1 5.7 5.1  NEUTROABS 3.5  --   --   --   HGB 10.9* 10.0* 9.9* 10.8*  HCT 33.9* 31.5* 31.7* 33.9*  MCV 103.0* 103.6* 105.3* 103.4*  PLT 134* 249 264 951   Basic Metabolic Panel: Recent Labs  Lab 08/05/20 0407 08/06/20 0053 08/07/20 0228 08/08/20 0108 08/09/20 0122  NA 139 138 140 141 140  K 3.6 4.1 3.7 3.4* 4.6  CL 106 104 105 103 105  CO2 26 23 26 28 24   GLUCOSE 115* 138* 106* 105* 88  BUN <5* <5* <5* <5* 7*  CREATININE 0.65 0.73 0.67 0.67 0.75  CALCIUM 8.0* 7.7* 8.3* 8.1* 8.2*  MG 1.6* 2.1 1.8 1.8 2.3   GFR: Estimated Creatinine Clearance: 70.9 mL/min (by C-G formula based on SCr of 0.75 mg/dL). Liver Function Tests: Recent Labs  Lab 08/05/20 0407 08/06/20 0053 08/07/20 0228 08/08/20 0108  AST 20 26 22 20   ALT 16 18 18 17   ALKPHOS 40 45 48 52  BILITOT 0.4 0.5 0.5 0.4  PROT 4.7* 4.3* 5.0* 5.1*  ALBUMIN 2.0* 1.8* 2.0* 2.0*     Recent Results (from the past 240 hour(s))  Resp Panel by RT-PCR  (Flu A&B, Covid) Nasopharyngeal Swab     Status: None   Collection Time: 08/02/20 11:56 AM   Specimen: Nasopharyngeal Swab; Nasopharyngeal(NP) swabs in vial transport medium  Result Value Ref Range Status   SARS Coronavirus 2 by RT PCR NEGATIVE NEGATIVE Final    Comment: (NOTE) SARS-CoV-2 target nucleic acids are NOT DETECTED.  The SARS-CoV-2 RNA is generally detectable in upper respiratory specimens during the acute phase of infection. The lowest concentration of SARS-CoV-2 viral copies this assay can detect is 138 copies/mL. A negative result does not preclude SARS-Cov-2 infection and should not be used as the sole basis for treatment or other patient management decisions. A negative result may occur with  improper specimen collection/handling, submission of specimen other than nasopharyngeal swab, presence of viral mutation(s) within the areas targeted by this assay, and inadequate number of viral copies(<138 copies/mL). A negative result must be combined with clinical observations, patient history, and epidemiological information. The expected result is Negative.  Fact Sheet for Patients:  EntrepreneurPulse.com.au  Fact Sheet for Healthcare Providers:  IncredibleEmployment.be  This test is no t yet approved or cleared by the [place] FDA and  has been authorized for detection and/or diagnosis of SARS-CoV-2 by FDA under an Emergency Use Authorization (EUA). This EUA will remain  in effect (meaning this test can be used) for the duration of the COVID-19 declaration under Section 564(b)(1) of the Act, 21 U.S.C.section 360bbb-3(b)(1), unless the authorization is terminated  or revoked sooner.       Influenza A by PCR NEGATIVE NEGATIVE Final   Influenza B by PCR NEGATIVE NEGATIVE Final    Comment: (NOTE) The Xpert Xpress SARS-CoV-2/FLU/RSV plus assay is intended as an aid in the diagnosis of influenza from Nasopharyngeal swab specimens  and should not be used as a sole basis for treatment. Nasal washings and aspirates are unacceptable for Xpert Xpress SARS-CoV-2/FLU/RSV testing.  Fact Sheet for Patients: EntrepreneurPulse.com.au  Fact Sheet for Healthcare Providers: IncredibleEmployment.be  This test is not yet approved or cleared by  the [employer] and has been authorized for detection and/or diagnosis of SARS-CoV-2 by FDA under an Emergency Use Authorization (EUA). This EUA will remain in effect (meaning this test can be used) for the duration of the COVID-19 declaration under Section 564(b)(1) of the Act, 21 U.S.C. section 360bbb-3(b)(1), unless the authorization is terminated or revoked.  Performed at [facility], [place] [address] [place], [place] [address]   Urine culture     Status: Abnormal   Collection Time: 08/02/20  2:00 PM   Specimen: Urine, Random  Result Value Ref Range Status   Specimen Description URINE, RANDOM  Final   Special Requests   Final    NONE Performed at [facility], [place] [address], [place], [place] [address]    Culture >=100,000 COLONIES/mL ESCHERICHIA COLI (A)  Final   Report Status 08/04/2020 FINAL  Final   Organism ID, Bacteria ESCHERICHIA COLI (A)  Final      Susceptibility   Escherichia coli - MIC*    AMPICILLIN <=2 SENSITIVE Sensitive     CEFAZOLIN <=4 SENSITIVE Sensitive     CEFEPIME <=0.12 SENSITIVE Sensitive     CEFTRIAXONE <=0.25 SENSITIVE Sensitive     CIPROFLOXACIN <=0.25 SENSITIVE Sensitive     GENTAMICIN <=1 SENSITIVE Sensitive     IMIPENEM <=0.25 SENSITIVE Sensitive     NITROFURANTOIN <=16 SENSITIVE Sensitive     TRIMETH/SULFA <=20 SENSITIVE Sensitive     AMPICILLIN/SULBACTAM <=2 SENSITIVE Sensitive     PIP/TAZO <=4 SENSITIVE Sensitive     * >=100,000 COLONIES/mL ESCHERICHIA COLI  Culture, blood (routine x 2)     Status: None   Collection Time: 08/02/20 11:20 PM   Specimen: BLOOD RIGHT HAND  Result Value  Ref Range Status   Specimen Description BLOOD RIGHT HAND  Final   Special Requests   Final    BOTTLES DRAWN AEROBIC AND ANAEROBIC Blood Culture adequate volume   Culture   Final    NO GROWTH 5 DAYS Performed at [facility], [address], [place], [place] [address]    Report Status 08/08/2020 FINAL  Final  Culture, blood (routine x 2)     Status: None   Collection Time: 08/02/20 11:35 PM   Specimen: BLOOD  Result Value Ref Range Status   Specimen Description BLOOD RT THUMB  Final   Special Requests AEROBIC BOTTLE ONLY Blood Culture adequate volume  Final   Culture   Final    NO GROWTH 5 DAYS Performed at [place] [address], [place], [place] [address]    Report Status 08/08/2020 FINAL  Final  Culture, blood (routine x 2)     Status: None (Preliminary result)   Collection Time: 08/07/20  1:02 PM   Specimen: BLOOD LEFT HAND  Result Value Ref Range Status   Specimen Description BLOOD LEFT HAND  Final   Special Requests   Final    BOTTLES DRAWN AEROBIC AND ANAEROBIC Blood Culture results may not be optimal due to an inadequate volume of blood received in culture bottles   Culture   Final    NO GROWTH 2 DAYS Performed at [facility], [place] [address], [place], [place] [address]    Report Status PENDING  Incomplete  Culture, blood (routine x 2)     Status: None (Preliminary result)   Collection Time: 08/07/20  1:03 PM   Specimen: BLOOD LEFT HAND  Result Value Ref Range Status   Specimen Description BLOOD  LEFT HAND  Final   Special Requests   Final    BOTTLES DRAWN AEROBIC AND ANAEROBIC Blood Culture results may not be optimal due to an inadequate volume of blood received in culture bottles   Culture   Final    NO GROWTH 2 DAYS Performed at [facility], [place] [address], [place], [place] [address]    Report Status PENDING  Incomplete  Resp Panel by RT-PCR (Flu A&B, Covid) Nasopharyngeal Swab     Status: None   Collection Time: 08/07/20  3:49 PM    Specimen: Nasopharyngeal Swab; Nasopharyngeal(NP) swabs in vial transport medium  Result Value Ref Range Status   SARS Coronavirus 2 by RT PCR NEGATIVE NEGATIVE Final    Comment: (NOTE) SARS-CoV-2 target nucleic acids are NOT DETECTED.  The SARS-CoV-2 RNA is generally detectable in upper respiratory specimens during the acute phase of infection. The lowest concentration of SARS-CoV-2 viral copies this assay can detect is 138 copies/mL. A negative result does not preclude SARS-Cov-2 infection and should not be used as the sole basis for treatment or other patient management decisions. A negative result may occur with  improper specimen collection/handling, submission of specimen other than nasopharyngeal swab, presence of viral mutation(s) within the areas targeted by this assay, and inadequate number of viral copies(<138 copies/mL). A negative result must be combined with clinical observations, patient history, and epidemiological information. The expected result is Negative.  Fact Sheet for Patients:  EntrepreneurPulse.com.au  Fact Sheet for Healthcare Providers:  IncredibleEmployment.be  This test is no t yet approved or cleared by the [place] FDA and  has been authorized for detection and/or diagnosis of SARS-CoV-2 by FDA under an Emergency Use Authorization (EUA). This EUA will remain  in effect (meaning this test can be used) for the duration of the COVID-19 declaration under Section 564(b)(1) of the Act, 21 U.S.C.section 360bbb-3(b)(1), unless the authorization is terminated  or revoked sooner.       Influenza A by PCR NEGATIVE NEGATIVE Final   Influenza B by PCR NEGATIVE NEGATIVE Final    Comment: (NOTE) The Xpert Xpress SARS-CoV-2/FLU/RSV plus assay is intended as an aid in the diagnosis of influenza from Nasopharyngeal swab specimens and should not be used as a sole basis for treatment. Nasal washings and aspirates are  unacceptable for Xpert Xpress SARS-CoV-2/FLU/RSV testing.  Fact Sheet for Patients: EntrepreneurPulse.com.au  Fact Sheet for Healthcare Providers: IncredibleEmployment.be  This test is not yet approved or cleared by the [place] FDA and has been authorized for detection and/or diagnosis of SARS-CoV-2 by FDA under an Emergency Use Authorization (EUA). This EUA will remain in effect (meaning this test can be used) for the duration of the COVID-19 declaration under Section 564(b)(1) of the Act, 21 U.S.C. section 360bbb-3(b)(1), unless the authorization is terminated or revoked.  Performed at [facility], [place] [address], [place], [place] [address]      Radiology Studies: VAS [place] UPPER EXTREMITY VENOUS DUPLEX  Result Date: 08/09/2020 UPPER VENOUS STUDY  Indications: Pain, and Swelling Other Indications: Previous PICC. Risk Factors: DVT 2014 LLE DVT. Comparison Study: No previous Upper ext duplex. Performing Technologist: Vonzell Schlatter RVT  Examination Guidelines: A complete evaluation includes B-mode imaging, spectral Doppler, color Doppler, and power Doppler as needed of all accessible portions of each vessel. Bilateral testing is considered an integral part of a complete examination. Limited examinations for reoccurring indications may be performed as noted.  Right Findings: +----------+------------+---------+-----------+----------+-------+ RIGHT     CompressiblePhasicitySpontaneousPropertiesSummary +----------+------------+---------+-----------+----------+-------+  IJV           Full       Yes       Yes                      +----------+------------+---------+-----------+----------+-------+ Subclavian    Full       Yes       Yes                      +----------+------------+---------+-----------+----------+-------+ Axillary      Full       Yes       Yes                       +----------+------------+---------+-----------+----------+-------+ Brachial      None                                          +----------+------------+---------+-----------+----------+-------+ Radial        Full                                          +----------+------------+---------+-----------+----------+-------+ Ulnar         Full                                          +----------+------------+---------+-----------+----------+-------+ Cephalic      Full                                          +----------+------------+---------+-----------+----------+-------+ Basilic       None                                          +----------+------------+---------+-----------+----------+-------+  Left Findings: +----------+------------+---------+-----------+----------+-------+ LEFT      CompressiblePhasicitySpontaneousPropertiesSummary +----------+------------+---------+-----------+----------+-------+ IJV           Full       Yes       Yes                      +----------+------------+---------+-----------+----------+-------+ Subclavian    Full       Yes       Yes                      +----------+------------+---------+-----------+----------+-------+ Axillary      Full       Yes       Yes                      +----------+------------+---------+-----------+----------+-------+ Brachial      None                                          +----------+------------+---------+-----------+----------+-------+ Radial        Full                                          +----------+------------+---------+-----------+----------+-------+  Ulnar         Full                                          +----------+------------+---------+-----------+----------+-------+ Cephalic      Full                                          +----------+------------+---------+-----------+----------+-------+ Basilic       Full                                           +----------+------------+---------+-----------+----------+-------+  Summary:  Right: Findings consistent with acute deep vein thrombosis involving the right brachial veins. Findings consistent with acute superficial vein thrombosis involving the right basilic vein. Duplicate brachial system with both being non-compressible.  Left: Findings consistent with acute deep vein thrombosis involving the left brachial veins. Duplicate brachial system with one being compressible and one is not.  *See table(s) above for measurements and observations.  Diagnosing physician: Harold Barban MD Electronically signed by Harold Barban MD on 08/09/2020 at 3:38:41 PM.    Final     Scheduled Meds: . acetaminophen  1,000 mg Oral TID  . Cenobamate  100 mg Oral Daily  . cholecalciferol  1,000 Units Oral Daily  . escitalopram  10 mg Oral Daily  . gabapentin  200 mg Oral QHS  . lacosamide  200 mg Oral BID  . multivitamin with minerals  1 tablet Oral Daily  . phenytoin  100 mg Oral Daily   And  . phenytoin  200 mg Oral QHS  . senna-docusate  2 tablet Oral QHS  . sodium chloride flush  10-40 mL Intracatheter Q12H  . vitamin B-12  1,000 mcg Oral Daily   Continuous Infusions: . heparin 1,400 Units/hr (08/10/20 1214)     LOS: 8 days   Vernell Leep, MD, [place], [facility]  To contact the attending provider between 7A-7P or the covering provider during after hours 7P-7A, please log into the web site [email or web address] and access using universal [place] password for that web site. If you do not have the password, please call the hospital operator.

## 2020-08-10 NOTE — Progress Notes (Signed)
PT Cancellation Note  Patient Details Name: [name] MRN: [id number] DOB: 1955-09-08   Cancelled Treatment:    Reason Eval/Treat Not Completed: (P) Other (comment) (Pt off unit for CT scan.)   Caellum Mancil Eli Hose 08/10/2020, 3:27 PM  Erasmo Leventhal , PTA Acute Rehabilitation Services Pager [phone] Office [phone]

## 2020-08-10 NOTE — Progress Notes (Addendum)
[place] for IV Heparin Indication:  Bilateral UE DVT  Allergies  Allergen Reactions  . Zonisamide Other (See Comments)    Numbness and tingling over whole body  . Chocolate Flavor Other (See Comments)    Trigger for Seizure  . Codeine Nausea And Vomiting  . Keppra [Levetiracetam] Other (See Comments)    Causes seizures  . Pregabalin Nausea And Vomiting  . Olive Oil Rash    Patient Measurements: Height: 5\' 7"  (170.2 cm) Weight: 63 kg (138 lb 14.2 oz) IBW/kg (Calculated) : 61.6 Heparin Dosing Weight:  63 kg  Vital Signs: Temp: 98 F (36.7 C) (01/07 1228) Temp Source: Oral (01/07 1228) BP: 117/63 (01/07 1228) Pulse Rate: 73 (01/07 1228)  Labs: Recent Labs    08/08/20 0108 08/09/20 0122 08/10/20 0325 08/10/20 1041 08/10/20 1805  HGB 10.0* 9.9* 10.8*  --   --   HCT 31.5* 31.7* 33.9*  --   --   PLT 249 264 253  --   --   HEPARINUNFRC  --   --  <0.10* 0.16* 0.26*  CREATININE 0.67 0.75  --   --   --     Estimated Creatinine Clearance: 70.9 mL/min (by C-G formula based on SCr of 0.75 mg/dL).   Assessment: 44 yr woman was admitted on 08/02/20 following a mechanical fall. Pt now with acute bilateral UE DVT (brachial veins) and R acute SVT of basilic vein. Pharmacy was consulted to dose IV heparin; per med rec, pt was on no anticoagulants PTA. Per primary team note, plan is to treat with IV heparin for 24-48 hrs, then switch to oral anticoagulation with apixaban. 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   Neurosurgery recommended starting heparin and getting repeat head CT  (CT done today indicated no acute abnormality), if stable may switch to oral anticoagulation.  Heparin level is trending up but remains subtherapeutic at 0.26 units/ml on heparin infusion at 1400 units/hr. H/H 10.8/33.9, plt 253 (CBC stable). Per RN, no issues with IV or bleeding observed.   Goal  of Therapy:  Heparin level 0.3-0.7 units/ml (will aim toward lower end of goal range given hx of remote ICH, until repeat CT done) Monitor platelets by anticoagulation protocol: Yes   Plan:  Increase heparin infusion to 1550 units/hr Check heparin level in 6 hrs Monitor daily heparin level, CBC Monitor for signs/symptoms of bleeding F/U transition to oral anticoagulant  Gillermina Hu, PharmD, BCPS, [facility] Clinical Pharmacist 08/10/2020

## 2020-08-10 NOTE — Plan of Care (Signed)
Pt. Remains on heparin drip for b/l arm clots. Right arm still more swollen than left. Both arms red and warm to touch. Pt states they do not hurt as bad. Pt. Had large BM today, full bath given. She is alert and oriented and cooperative. Cervical collar changed to [place]. Fuller Canada, RN

## 2020-08-10 NOTE — Progress Notes (Signed)
[place] for IV Heparin Indication:  Bilateral UE DVT  Allergies  Allergen Reactions  . Zonisamide Other (See Comments)    Numbness and tingling over whole body  . Chocolate Flavor Other (See Comments)    Trigger for Seizure  . Codeine Nausea And Vomiting  . Keppra [Levetiracetam] Other (See Comments)    Causes seizures  . Pregabalin Nausea And Vomiting  . Olive Oil Rash    Patient Measurements: Height: 5\' 7"  (170.2 cm) Weight: 63 kg (138 lb 14.2 oz) IBW/kg (Calculated) : 61.6 Heparin Dosing Weight:  63 kg  Vital Signs: Temp: 98 F (36.7 C) (01/07 0340) Temp Source: Oral (01/07 0340) BP: 130/60 (01/07 0340)  Labs: Recent Labs    08/08/20 0108 08/09/20 0122 08/10/20 0325 08/10/20 1041  HGB 10.0* 9.9* 10.8*  --   HCT 31.5* 31.7* 33.9*  --   PLT 249 264 253  --   HEPARINUNFRC  --   --  <0.10* 0.16*  CREATININE 0.67 0.75  --   --     Estimated Creatinine Clearance: 70.9 mL/min (by C-G formula based on SCr of 0.75 mg/dL).   Assessment: 55 yr woman was admitted on 08/02/20 following a mechanical fall. Pt now with acute bilateral UE DVT (brachial veins) and R acute SVT of basilic vein. Pharmacy is consulted to dose IV heparin; per med rec, pt was on no anticoagulants PTA. Per primary team note, plan is to treat with IV heparin for 24-48 hrs, then switch to oral anticoagulation with apixaban. 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   Neurosurgery recommended starting heparin and getting repeat head CT today, if stable may switch to oral anticoagulation. Heparin level is trending up but remains subtherapeutic at 0.16.   Goal of Therapy:  Heparin level 0.3-0.7 units/ml (will aim toward lower end of goal range given hx of remote ICH, until repeat CT done in 24 hrs) Monitor platelets by anticoagulation protocol: Yes   Plan:  Rebolus heparin 1500 units and  increase gtt to 1400 units/hr Check heparin level in 6 hrs  Arrie Senate, PharmD, [place], [facility] Clinical Pharmacist [phone] Please check AMION for all [place] numbers 08/10/2020

## 2020-08-10 NOTE — Progress Notes (Signed)
[place] for IV Heparin Indication:  Bilateral UE DVT  Allergies  Allergen Reactions  . Zonisamide Other (See Comments)    Numbness and tingling over whole body  . Chocolate Flavor Other (See Comments)    Trigger for Seizure  . Codeine Nausea And Vomiting  . Keppra [Levetiracetam] Other (See Comments)    Causes seizures  . Pregabalin Nausea And Vomiting  . Olive Oil Rash    Patient Measurements: Height: 5\' 7"  (170.2 cm) Weight: 63 kg (138 lb 14.2 oz) IBW/kg (Calculated) : 61.6 Heparin Dosing Weight:  63 kg  Vital Signs: Temp: 98 F (36.7 C) (01/07 0340) Temp Source: Oral (01/07 0340) BP: 130/60 (01/07 0340)  Labs: Recent Labs    08/08/20 0108 08/09/20 0122 08/10/20 0325  HGB 10.0* 9.9* 10.8*  HCT 31.5* 31.7* 33.9*  PLT 249 264 253  HEPARINUNFRC  --   --  <0.10*  CREATININE 0.67 0.75  --     Estimated Creatinine Clearance: 70.9 mL/min (by C-G formula based on SCr of 0.75 mg/dL).   Assessment: 73 yr woman was admitted on 08/02/20 following a mechanical fall. Pt now with acute bilateral UE DVT (brachial veins) and R acute SVT of basilic vein (likely precipitated by amiodarone extravasation, per provider note). Pharmacy is consulted to dose IV heparin; per med rec, pt was on no anticoagulants PTA. Per primary team note, plan is to treat with IV heparin for 24-48 hrs, then switch to oral anticoagulation with apixaban.  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 However, no recent acute hemorrhage on head or neck CT. Neurosurgery recommends initiating IV heparin, closely monitoring, getting repeat head CT in 24 hrs, and if tolerates, transitioning to oral anticoagulation.  Heparin level undetectable on gtt at 1000 units/hr. No issues with line or bleeding reported per RN.  Goal of Therapy:  Heparin level 0.3-0.7 units/ml (will aim toward lower end of goal range given hx of remote ICH, until  repeat CT done in 24 hrs) Monitor platelets by anticoagulation protocol: Yes   Plan:  Rebolus heparin 1500 units and increase gtt to 1200 units/hr Check heparin level in 6 hrs  Sherlon Handing, PharmD, BCPS Please see amion for complete clinical pharmacist phone list 08/10/2020,4:47 AM

## 2020-08-10 NOTE — Progress Notes (Signed)
Orthopedic Tech Progress Note Patient Details:  [name] 12-12-1955 [id number] Applied an [place] with the help of RN. Patient ID: [name], female   DOB: 1955-11-11, 65 y.o.   MRN: [id number]   Janit Pagan 08/10/2020, 10:47 AM

## 2020-08-11 DIAGNOSIS — W19XXXA Unspecified fall, initial encounter: Secondary | ICD-10-CM | POA: Diagnosis not present

## 2020-08-11 DIAGNOSIS — I4891 Unspecified atrial fibrillation: Secondary | ICD-10-CM | POA: Diagnosis not present

## 2020-08-11 LAB — CBC
HCT: 30.6 % — ABNORMAL LOW (ref 36.0–46.0)
Hemoglobin: 10.3 g/dL — ABNORMAL LOW (ref 12.0–15.0)
MCH: 35 pg — ABNORMAL HIGH (ref 26.0–34.0)
MCHC: 33.7 g/dL (ref 30.0–36.0)
MCV: 104.1 fL — ABNORMAL HIGH (ref 80.0–100.0)
Platelets: 301 10*3/uL (ref 150–400)
RBC: 2.94 MIL/uL — ABNORMAL LOW (ref 3.87–5.11)
RDW: 12.5 % (ref 11.5–15.5)
WBC: 5 10*3/uL (ref 4.0–10.5)
nRBC: 0 % (ref 0.0–0.2)

## 2020-08-11 LAB — HEPARIN LEVEL (UNFRACTIONATED)
Heparin Unfractionated: 0.47 IU/mL (ref 0.30–0.70)
Heparin Unfractionated: 0.7 IU/mL (ref 0.30–0.70)

## 2020-08-11 MED ORDER — HEPARIN (PORCINE) 25000 UT/250ML-% IV SOLN
1500.0000 [IU]/h | INTRAVENOUS | Status: DC
  Administered 2020-08-11: 1500 [IU]/h via INTRAVENOUS
  Filled 2020-08-11 (×2): qty 250

## 2020-08-11 NOTE — Progress Notes (Signed)
[place] for IV Heparin Indication:  Bilateral UE DVT  Allergies  Allergen Reactions  . Zonisamide Other (See Comments)    Numbness and tingling over whole body  . Chocolate Flavor Other (See Comments)    Trigger for Seizure  . Codeine Nausea And Vomiting  . Keppra [Levetiracetam] Other (See Comments)    Causes seizures  . Pregabalin Nausea And Vomiting  . Olive Oil Rash    Patient Measurements: Height: 5\' 7"  (170.2 cm) Weight: 63 kg (138 lb 14.2 oz) IBW/kg (Calculated) : 61.6 Heparin Dosing Weight:  63 kg  Vital Signs: Temp: 98.3 F (36.8 C) (01/07 2219) Temp Source: Oral (01/07 2219) BP: 125/61 (01/07 2219) Pulse Rate: 75 (01/07 2219)  Labs: Recent Labs    08/09/20 0122 08/09/20 0122 08/10/20 0325 08/10/20 1041 08/10/20 1805 08/11/20 0141  HGB 9.9*  --  10.8*  --   --  10.3*  HCT 31.7*  --  33.9*  --   --  30.6*  PLT 264  --  253  --   --  301  HEPARINUNFRC  --    < > <0.10* 0.16* 0.26* 0.47  CREATININE 0.75  --   --   --   --   --    < > = values in this interval not displayed.    Estimated Creatinine Clearance: 70.9 mL/min (by C-G formula based on SCr of 0.75 mg/dL).   Assessment: 83 yr woman was admitted on 08/02/20 following a mechanical fall. Pt now with acute bilateral UE DVT (brachial veins) and R acute SVT of basilic vein. Pharmacy was consulted to dose IV heparin; per med rec, pt was on no anticoagulants PTA. Per primary team note, plan is to treat with IV heparin for 24-48 hrs, then switch to oral anticoagulation with apixaban. 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   Neurosurgery recommended starting heparin and getting repeat head CT  (CT done today indicated no acute abnormality), if stable may switch to oral anticoagulation.  Heparin level 0.47 (therapeutic) on gtt at 1550 units/hr. No bleeding noted.  Goal of Therapy:  Heparin level  0.3-0.7 units/ml (will aim toward lower end of goal range given hx of remote ICH, until repeat CT done) Monitor platelets by anticoagulation protocol: Yes   Plan:  Continue heparin infusion at 1550 units/hr F/u 6 hr heparin level to confirm  Sherlon Handing, PharmD, BCPS Please see amion for complete clinical pharmacist phone list 08/11/2020

## 2020-08-11 NOTE — Progress Notes (Signed)
Physical Therapy Treatment Patient Details Name: [name] MRN: [id number] DOB: [birth year] Today's Date: 08/11/2020    History of Present Illness 65 y.o. female with fall resulting in C2 fracture and has left hand/finger fracture with splint in place.  Pt with new onset atrial flutter with rapid ventricular response as well as has UTI.  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    PT Comments    Bed mobility and transfer goals met and updated. Upon arrival, patient in bed without collar, educated patient on MD's rec of maintaining at all times. Patient requires supervision for bed mobility and does not follow commands/cues for following cervical precautions. Patient able to recall 1/3 precautions, reviewed cervical precautions. Patient ambulated 40' with RW and min guard, cues for RW management. Patient continues to be limited by impaired balance, generalized weakness, decreased activity tolerance. Recommend SNF following discharge to maximize functional independence and safety.     Follow Up Recommendations  SNF;Supervision/Assistance - 24 hour     Equipment Recommendations  3in1 (PT)    Recommendations for Other Services       Precautions / Restrictions Precautions Precautions: Fall;Cervical Precaution Booklet Issued: No Precaution Comments: Reviewed cervical precautions Required Braces or Orthoses: Cervical Brace Cervical Brace: Hard collar;At all times Splint/Cast: Patient found with brace off while sleeping in bed, head rotated to L and flexed. Educated patient on MD's recs for brace on at all times Restrictions Weight Bearing Restrictions: Yes LUE Weight Bearing: Weight bearing as tolerated Other Position/Activity Restrictions:  "minimize weight bearing to what she can toleate" per Dr Wyline Copas    Mobility  Bed Mobility Overal bed mobility: Needs Assistance Bed Mobility: Rolling;Sidelying to Sit;Sit to Supine Rolling: Supervision Sidelying to sit: Supervision   Sit to supine: Supervision;HOB elevated   General bed mobility comments: Patient not maintaining cervical precautions with getting OOB,. Not following commands or cues for log roll technique.  Transfers Overall transfer level: Needs assistance Equipment used: Rolling [name] (2 wheeled) Transfers: Sit to/from Stand Sit to Stand: Min guard         General transfer comment: min guard for safety and for balance  Ambulation/Gait Ambulation/Gait assistance: Min guard Gait Distance (Feet): 15 Feet Assistive device: Rolling [name] (2 wheeled) Gait Pattern/deviations: Step-through pattern;Decreased stride length;Wide base of support Gait velocity: decreased   General Gait Details: amb to bathroom with RW and min guard, cues for RW management   Stairs             Wheelchair Mobility    Modified Rankin (Stroke Patients Only)       Balance Overall balance assessment: Needs assistance Sitting-balance support: No upper extremity supported;Feet supported Sitting balance-Leahy Scale: Poor Sitting balance - Comments: required at least one UE support for balance   Standing balance support: Bilateral upper extremity supported;During functional activity Standing balance-Leahy Scale: Poor Standing balance comment: Reliant on UE support                            Cognition Arousal/Alertness: Awake/alert Behavior During Therapy: WFL for tasks assessed/performed Overall Cognitive Status: History of cognitive impairments - at baseline  Exercises      General Comments General comments (skin integrity, edema, etc.): Patient with blood on washcloth following pericare, notified RN.  RN in to assess and to notify MD.      Pertinent Vitals/Pain Pain Assessment: Faces Faces Pain Scale: Hurts a little bit Pain Location: "The neck brace hurts my chest" Pain Descriptors / Indicators: Aching;Grimacing;Guarding Pain Intervention(s): Monitored during session    Home Living                      Prior Function            PT Goals (current goals can now be found in the care plan section) Acute Rehab PT Goals Patient Stated Goal: To be able to walk to the bathroom PT Goal Formulation: With patient Time For Goal Achievement: 08/17/20 Potential to Achieve Goals: Good Progress towards PT goals: Progressing toward goals    Frequency    Min 3X/week      PT Plan Current plan remains appropriate    Co-evaluation              AM-PAC PT "6 Clicks" Mobility   Outcome Measure  Help needed turning from your back to your side while in a flat bed without using bedrails?: A Little Help needed moving from lying on your back to sitting on the side of a flat bed without using bedrails?: A Little Help needed moving to and from a bed to a chair (including a wheelchair)?: A Little Help needed standing up from a chair using your arms (e.g., wheelchair or bedside chair)?: A Little Help needed to walk in hospital room?: A Little Help needed climbing 3-5 steps with a railing? : A Little 6 Click Score: 18    End of Session Equipment Utilized During Treatment: Gait belt Activity Tolerance: Patient tolerated treatment well Patient left: in bed;with call bell/phone within reach;with bed alarm set Nurse Communication: Mobility status PT Visit Diagnosis: Unsteadiness on feet (R26.81);Muscle weakness (generalized) (M62.81);Dizziness and giddiness (R42);Pain     Time: 3704-8889 PT Time Calculation (min) (ACUTE ONLY): 32 min  Charges:  $Therapeutic Activity: 23-37 mins                     [name] A. Gilford Rile PT, DPT Acute Rehabilitation Services Pager  [phone] Office [phone]  Melene Plan Allred 08/11/2020, 9:08 AM

## 2020-08-11 NOTE — TOC Progression Note (Signed)
Transition of Care [facility]) - Progression Note    Patient Details  Name: [name] MRN: [id number] Date of Birth: 27-Apr-1956  [facility] CM/SW Contact  Purcell Mouton, RN Phone Number: 08/11/2020, 3:42 PM  Clinical Narrative:    Spoke with pt concerning discharge, pt agreed with HHPT. [facility] was selected.         Expected Discharge Plan and Services                                                 Social Determinants of Health (SDOH) Interventions    Readmission Risk Interventions No flowsheet data found.

## 2020-08-11 NOTE — Progress Notes (Signed)
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 coli UTI, anxiety/depression, polyneuropathy presented to emergency room on 12/30 with fall at home associated with left hand pain, neck pain and occipital headache.  In the emergency room she was found to have new onset A. fib with RVR, urinalysis positive for pyuria, C2 lateral mass fracture, left hand and finger fracture, left pyelonephritis on CT scan.  She was seen by neurosurgery, recommended conservative management.  Left upper extremity splint.  Cardiology consulted for rapid A. fib.  Also found to have bilateral upper extremity DVTs.   Assessment & Plan:   Active Problems:   Fall  Fall, etiology unclear: Probably multifactorial in multisensory fall.  2D echocardiogram normal.  Currently normalized.  Work with PT OT.  Insurance declined acute inpatient rehab.  Referred to a SNF for ongoing inpatient therapies.  New onset rapid a flutter: Transient.  Treated with amiodarone, converted to sinus.  A flutter load was very low.  Now remains rate controlled sinus rhythm.  Cardiology recommended monitoring but no further treatment.  Hypokalemia/hypomagnesemia: Replaced and normalized.  Acute bilateral upper extremity DVT, right acute SVT of the basilic vein: Symptomatic. With history of ICH discussed with neurosurgery who cleared patient to have anticoagulation. Started on heparin, repeat CT scan stable on 1/7. Patient reported wiping streak of blood 1/8 morning from her vagina.  No further evidence of bleeding.  We will continue to monitor today and changed to Eliquis for 3 months on discharge tomorrow.  Acute UTI present on admission: Completed treatment with meropenem and  Rocephin.  Improved.  C2 left lateral mass fracture status post fall: Seen by neurosurgery.  Recommended Aspen collar and mobility.  Neurosurgery will schedule outpatient follow-up.  Left hand and finger fracture: Conservative management.  Seizure disorder: Subtherapeutic Dilantin level.  No evidence of recurrence of seizure.  Resume home regimen.  Polyneuropathy: On Neurontin.  Chronic anxiety and depression: On escitalopram.   DVT prophylaxis: Place and maintain sequential compression device Start: 08/02/20 1715   Code Status: Full code. Family Communication: None.  Patient communicating with family. Disposition Plan: Status is: Inpatient  Remains inpatient appropriate because:Inpatient level of care appropriate due to severity of illness   Dispo:  Patient From: Home  Planned Disposition: Skilled nursing facility versus home with home health PT OT.  Anticipate tomorrow if remains a stable.  Expected discharge date: 08/13/2020  Medically stable for discharge: No    Consultants:   Neurosurgery  Cardiology  Procedures:   None  Antimicrobials:   Finished antibiotic therapy   Subjective: Patient seen and examined.  No overnight events.  She is not sure she wants to go to a skilled rehab and wants to go home. She wiped a small streak of blood when urinating, seen by this provider.  No obvious bleeding. Cervical collar is very uncomfortable to her.  Objective: Vitals:   08/10/20 0340 08/10/20 1228 08/10/20 2219 08/11/20 0614  BP: 130/60 117/63 125/61 (!) 114/53  Pulse:  73 75 71  Resp: 20 18  17   Temp: 98 F (36.7 C) 98 F (36.7 C) 98.3 F (36.8 C) 99.1 F (37.3 C)  TempSrc: Oral Oral Oral Oral  SpO2: 95% 99% 93% 94%  Weight:  Height:        Intake/Output Summary (Last 24 hours) at 08/11/2020 1206 Last data filed at 08/11/2020 0944 Gross per 24 hour  Intake 893 ml  Output 100 ml  Net 793 ml   Filed Weights   08/02/20 1600  Weight: 63 kg     Examination:  General exam: Appears calm and comfortable  Chronically sick looking.  Not in any distress. Respiratory system: Clear to auscultation. Respiratory effort normal.  No added sounds. Cardiovascular system: S1 & S2 heard, RRR. Gastrointestinal system: Soft.  Nontender.  Bowel sounds present.   Central nervous system: Alert and oriented. No focal neurological deficits. Extremities: Symmetric 5 x 5 power. Skin: No rashes, lesions or ulcers Psychiatry: Judgement and insight appear normal. Mood & affect appropriate.     Data Reviewed: I have personally reviewed following labs and imaging studies  CBC: Recent Labs  Lab 08/08/20 0108 08/09/20 0122 08/10/20 0325 08/11/20 0141  WBC 7.1 5.7 5.1 5.0  HGB 10.0* 9.9* 10.8* 10.3*  HCT 31.5* 31.7* 33.9* 30.6*  MCV 103.6* 105.3* 103.4* 104.1*  PLT 249 264 253 [id number]   Basic Metabolic Panel: Recent Labs  Lab 08/05/20 0407 08/06/20 0053 08/07/20 0228 08/08/20 0108 08/09/20 0122  NA 139 138 140 141 140  K 3.6 4.1 3.7 3.4* 4.6  CL 106 104 105 103 105  CO2 26 23 26 28 24   GLUCOSE 115* 138* 106* 105* 88  BUN <5* <5* <5* <5* 7*  CREATININE 0.65 0.73 0.67 0.67 0.75  CALCIUM 8.0* 7.7* 8.3* 8.1* 8.2*  MG 1.6* 2.1 1.8 1.8 2.3   GFR: Estimated Creatinine Clearance: 70.9 mL/min (by C-G formula based on SCr of 0.75 mg/dL). Liver Function Tests: Recent Labs  Lab 08/05/20 0407 08/06/20 0053 08/07/20 0228 08/08/20 0108  AST 20 26 22 20   ALT 16 18 18 17   ALKPHOS 40 45 48 52  BILITOT 0.4 0.5 0.5 0.4  PROT 4.7* 4.3* 5.0* 5.1*  ALBUMIN 2.0* 1.8* 2.0* 2.0*   No results for input(s): LIPASE, AMYLASE in the last 168 hours. No results for input(s): AMMONIA in the last 168 hours. Coagulation Profile: No results for input(s): INR, PROTIME in the last 168 hours. Cardiac Enzymes: No results for input(s): CKTOTAL, CKMB, CKMBINDEX, TROPONINI in the last 168 hours. BNP (last 3 results) No results for input(s): PROBNP in the last  8760 hours. HbA1C: No results for input(s): HGBA1C in the last 72 hours. CBG: No results for input(s): GLUCAP in the last 168 hours. Lipid Profile: No results for input(s): CHOL, HDL, LDLCALC, TRIG, CHOLHDL, LDLDIRECT in the last 72 hours. Thyroid Function Tests: No results for input(s): TSH, T4TOTAL, FREET4, T3FREE, THYROIDAB in the last 72 hours. Anemia Panel: No results for input(s): VITAMINB12, FOLATE, FERRITIN, TIBC, IRON, RETICCTPCT in the last 72 hours. Sepsis Labs: No results for input(s): PROCALCITON, LATICACIDVEN in the last 168 hours.  Recent Results (from the past 240 hour(s))  Resp Panel by RT-PCR (Flu A&B, Covid) Nasopharyngeal Swab     Status: None   Collection Time: 08/02/20 11:56 AM   Specimen: Nasopharyngeal Swab; Nasopharyngeal(NP) swabs in vial transport medium  Result Value Ref Range Status   SARS Coronavirus 2 by RT PCR NEGATIVE NEGATIVE Final    Comment: (NOTE) SARS-CoV-2 target nucleic acids are NOT DETECTED.  The SARS-CoV-2 RNA is generally detectable in upper respiratory specimens during the acute phase of infection. The lowest concentration of SARS-CoV-2 viral copies this assay can detect is 138 copies/mL. A negative result does  not preclude SARS-Cov-2 infection and should not be used as the sole basis for treatment or other patient management decisions. A negative result may occur with  improper specimen collection/handling, submission of specimen other than nasopharyngeal swab, presence of viral mutation(s) within the areas targeted by this assay, and inadequate number of viral copies(<138 copies/mL). A negative result must be combined with clinical observations, patient history, and epidemiological information. The expected result is Negative.  Fact Sheet for Patients:  EntrepreneurPulse.com.au  Fact Sheet for Healthcare Providers:  IncredibleEmployment.be  This test is no t yet approved or cleared by the  [place] FDA and  has been authorized for detection and/or diagnosis of SARS-CoV-2 by FDA under an Emergency Use Authorization (EUA). This EUA will remain  in effect (meaning this test can be used) for the duration of the COVID-19 declaration under Section 564(b)(1) of the Act, 21 U.S.C.section 360bbb-3(b)(1), unless the authorization is terminated  or revoked sooner.       Influenza A by PCR NEGATIVE NEGATIVE Final   Influenza B by PCR NEGATIVE NEGATIVE Final    Comment: (NOTE) The Xpert Xpress SARS-CoV-2/FLU/RSV plus assay is intended as an aid in the diagnosis of influenza from Nasopharyngeal swab specimens and should not be used as a sole basis for treatment. Nasal washings and aspirates are unacceptable for Xpert Xpress SARS-CoV-2/FLU/RSV testing.  Fact Sheet for Patients: EntrepreneurPulse.com.au  Fact Sheet for Healthcare Providers: IncredibleEmployment.be  This test is not yet approved or cleared by the [place] FDA and has been authorized for detection and/or diagnosis of SARS-CoV-2 by FDA under an Emergency Use Authorization (EUA). This EUA will remain in effect (meaning this test can be used) for the duration of the COVID-19 declaration under Section 564(b)(1) of the Act, 21 U.S.C. section 360bbb-3(b)(1), unless the authorization is terminated or revoked.  Performed at [facility], [place] [address], [place], [place] [address]   Urine culture     Status: Abnormal   Collection Time: 08/02/20  2:00 PM   Specimen: Urine, Random  Result Value Ref Range Status   Specimen Description URINE, RANDOM  Final   Special Requests   Final    NONE Performed at [facility], [place] [address], [place], [place] [address]    Culture >=100,000 COLONIES/mL ESCHERICHIA COLI (A)  Final   Report Status 08/04/2020 FINAL  Final   Organism ID, Bacteria ESCHERICHIA COLI (A)  Final      Susceptibility   Escherichia coli - MIC*     AMPICILLIN <=2 SENSITIVE Sensitive     CEFAZOLIN <=4 SENSITIVE Sensitive     CEFEPIME <=0.12 SENSITIVE Sensitive     CEFTRIAXONE <=0.25 SENSITIVE Sensitive     CIPROFLOXACIN <=0.25 SENSITIVE Sensitive     GENTAMICIN <=1 SENSITIVE Sensitive     IMIPENEM <=0.25 SENSITIVE Sensitive     NITROFURANTOIN <=16 SENSITIVE Sensitive     TRIMETH/SULFA <=20 SENSITIVE Sensitive     AMPICILLIN/SULBACTAM <=2 SENSITIVE Sensitive     PIP/TAZO <=4 SENSITIVE Sensitive     * >=100,000 COLONIES/mL ESCHERICHIA COLI  Culture, blood (routine x 2)     Status: None   Collection Time: 08/02/20 11:20 PM   Specimen: BLOOD RIGHT HAND  Result Value Ref Range Status   Specimen Description BLOOD RIGHT HAND  Final   Special Requests   Final    BOTTLES DRAWN AEROBIC AND ANAEROBIC Blood Culture adequate volume   Culture   Final    NO GROWTH 5 DAYS Performed at [facility]  [facility], [place] [address] [place], [place] [address]    Report Status 08/08/2020 FINAL  Final  Culture, blood (routine x 2)     Status: None   Collection Time: 08/02/20 11:35 PM   Specimen: BLOOD  Result Value Ref Range Status   Specimen Description BLOOD RT THUMB  Final   Special Requests AEROBIC BOTTLE ONLY Blood Culture adequate volume  Final   Culture   Final    NO GROWTH 5 DAYS Performed at [place] [address], [place], [place] [address]    Report Status 08/08/2020 FINAL  Final  Culture, blood (routine x 2)     Status: None (Preliminary result)   Collection Time: 08/07/20  1:02 PM   Specimen: BLOOD LEFT HAND  Result Value Ref Range Status   Specimen Description BLOOD LEFT HAND  Final   Special Requests   Final    BOTTLES DRAWN AEROBIC AND ANAEROBIC Blood Culture results may not be optimal due to an inadequate volume of blood received in culture bottles   Culture   Final    NO GROWTH 4 DAYS Performed at [facility], [place] [address], [place], [place] [address]    Report Status PENDING  Incomplete  Culture, blood  (routine x 2)     Status: None (Preliminary result)   Collection Time: 08/07/20  1:03 PM   Specimen: BLOOD LEFT HAND  Result Value Ref Range Status   Specimen Description BLOOD LEFT HAND  Final   Special Requests   Final    BOTTLES DRAWN AEROBIC AND ANAEROBIC Blood Culture results may not be optimal due to an inadequate volume of blood received in culture bottles   Culture   Final    NO GROWTH 4 DAYS Performed at [facility], [place] [address] [place], [place] [address]    Report Status PENDING  Incomplete  Resp Panel by RT-PCR (Flu A&B, Covid) Nasopharyngeal Swab     Status: None   Collection Time: 08/07/20  3:49 PM   Specimen: Nasopharyngeal Swab; Nasopharyngeal(NP) swabs in vial transport medium  Result Value Ref Range Status   SARS Coronavirus 2 by RT PCR NEGATIVE NEGATIVE Final    Comment: (NOTE) SARS-CoV-2 target nucleic acids are NOT DETECTED.  The SARS-CoV-2 RNA is generally detectable in upper respiratory specimens during the acute phase of infection. The lowest concentration of SARS-CoV-2 viral copies this assay can detect is 138 copies/mL. A negative result does not preclude SARS-Cov-2 infection and should not be used as the sole basis for treatment or other patient management decisions. A negative result may occur with  improper specimen collection/handling, submission of specimen other than nasopharyngeal swab, presence of viral mutation(s) within the areas targeted by this assay, and inadequate number of viral copies(<138 copies/mL). A negative result must be combined with clinical observations, patient history, and epidemiological information. The expected result is Negative.  Fact Sheet for Patients:  EntrepreneurPulse.com.au  Fact Sheet for Healthcare Providers:  IncredibleEmployment.be  This test is no t yet approved or cleared by the [place] FDA and  has been authorized for detection and/or diagnosis of  SARS-CoV-2 by FDA under an Emergency Use Authorization (EUA). This EUA will remain  in effect (meaning this test can be used) for the duration of the COVID-19 declaration under Section 564(b)(1) of the Act, 21 U.S.C.section 360bbb-3(b)(1), unless the authorization is terminated  or revoked sooner.       Influenza A by PCR NEGATIVE NEGATIVE Final  Influenza B by PCR NEGATIVE NEGATIVE Final    Comment: (NOTE) The Xpert Xpress SARS-CoV-2/FLU/RSV plus assay is intended as an aid in the diagnosis of influenza from Nasopharyngeal swab specimens and should not be used as a sole basis for treatment. Nasal washings and aspirates are unacceptable for Xpert Xpress SARS-CoV-2/FLU/RSV testing.  Fact Sheet for Patients: EntrepreneurPulse.com.au  Fact Sheet for Healthcare Providers: IncredibleEmployment.be  This test is not yet approved or cleared by the [place] FDA and has been authorized for detection and/or diagnosis of SARS-CoV-2 by FDA under an Emergency Use Authorization (EUA). This EUA will remain in effect (meaning this test can be used) for the duration of the COVID-19 declaration under Section 564(b)(1) of the Act, 21 U.S.C. section 360bbb-3(b)(1), unless the authorization is terminated or revoked.  Performed at [facility], [place] [address], [place], [place] [address]          Radiology Studies: CT HEAD WO CONTRAST  Result Date: 08/10/2020 CLINICAL DATA:  [place]. EXAM: CT HEAD WITHOUT CONTRAST TECHNIQUE: Contiguous axial images were obtained from the base of the skull through the vertex without intravenous contrast. 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 Stable ex vacuo enlargement of the adjacent right lateral ventricle. Stable moderate enlargement of the remainder of the ventricles with mild to moderate diffuse enlargement of the subarachnoid  spaces. STABLE MINIMAL WHITE No intracranial hemorrhage, mass lesion or CT evidence of acute infarction. Vascular: No hyperdense vessel or unexpected calcification. Skull: Stable right posterior parietal post craniotomy changes. No acute fracture seen. Sinuses/Orbits: Unremarkable. Other: None. IMPRESSION: 1. No acute abnormality. 2. 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 3. Stable mild-to-moderate diffuse cerebral and cerebellar atrophy. Electronically Signed   By: Claudie Revering M.D.   On: 08/10/2020 17:15   VAS [place] UPPER EXTREMITY VENOUS DUPLEX  Result Date: 08/09/2020 UPPER VENOUS STUDY  Indications: Pain, and Swelling Other Indications: Previous PICC. Risk Factors: DVT 2014 LLE DVT. Comparison Study: No previous Upper ext duplex. Performing Technologist: Vonzell Schlatter RVT  Examination Guidelines: A complete evaluation includes B-mode imaging, spectral Doppler, color Doppler, and power Doppler as needed of all accessible portions of each vessel. Bilateral testing is considered an integral part of a complete examination. Limited examinations for reoccurring indications may be performed as noted.  Right Findings: +----------+------------+---------+-----------+----------+-------+ RIGHT     CompressiblePhasicitySpontaneousPropertiesSummary +----------+------------+---------+-----------+----------+-------+ IJV           Full       Yes       Yes                      +----------+------------+---------+-----------+----------+-------+ Subclavian    Full       Yes       Yes                      +----------+------------+---------+-----------+----------+-------+ Axillary      Full       Yes       Yes                      +----------+------------+---------+-----------+----------+-------+ Brachial      None                                          +----------+------------+---------+-----------+----------+-------+ Radial  Full                                           +----------+------------+---------+-----------+----------+-------+ Ulnar         Full                                          +----------+------------+---------+-----------+----------+-------+ Cephalic      Full                                          +----------+------------+---------+-----------+----------+-------+ Basilic       None                                          +----------+------------+---------+-----------+----------+-------+  Left Findings: +----------+------------+---------+-----------+----------+-------+ LEFT      CompressiblePhasicitySpontaneousPropertiesSummary +----------+------------+---------+-----------+----------+-------+ IJV           Full       Yes       Yes                      +----------+------------+---------+-----------+----------+-------+ Subclavian    Full       Yes       Yes                      +----------+------------+---------+-----------+----------+-------+ Axillary      Full       Yes       Yes                      +----------+------------+---------+-----------+----------+-------+ Brachial      None                                          +----------+------------+---------+-----------+----------+-------+ Radial        Full                                          +----------+------------+---------+-----------+----------+-------+ Ulnar         Full                                          +----------+------------+---------+-----------+----------+-------+ Cephalic      Full                                          +----------+------------+---------+-----------+----------+-------+ Basilic       Full                                          +----------+------------+---------+-----------+----------+-------+  Summary:  Right: Findings consistent with acute deep vein  thrombosis involving the right brachial veins. Findings consistent with acute superficial vein  thrombosis involving the right basilic vein. Duplicate brachial system with both being non-compressible.  Left: Findings consistent with acute deep vein thrombosis involving the left brachial veins. Duplicate brachial system with one being compressible and one is not.  *See table(s) above for measurements and observations.  Diagnosing physician: Harold Barban MD Electronically signed by Harold Barban MD on 08/09/2020 at 3:38:41 PM.    Final         Scheduled Meds: . acetaminophen  1,000 mg Oral TID  . Cenobamate  100 mg Oral Daily  . cholecalciferol  1,000 Units Oral Daily  . escitalopram  10 mg Oral Daily  . gabapentin  200 mg Oral QHS  . lacosamide  200 mg Oral BID  . multivitamin with minerals  1 tablet Oral Daily  . phenytoin  100 mg Oral Daily   And  . phenytoin  200 mg Oral QHS  . senna-docusate  2 tablet Oral QHS  . sodium chloride flush  10-40 mL Intracatheter Q12H  . vitamin B-12  1,000 mcg Oral Daily   Continuous Infusions: . heparin 1,550 Units/hr (08/11/20 0538)     LOS: 9 days    Time spent: 25 minutes    Barb Merino, MD [facility] Pager [phone]

## 2020-08-11 NOTE — Progress Notes (Addendum)
ANTICOAGULATION CONSULT NOTE  Pharmacy Consult for IV Heparin Indication:  Bilateral UE DVT  Allergies  Allergen Reactions  . Zonisamide Other (See Comments)    Numbness and tingling over whole body  . Chocolate Flavor Other (See Comments)    Trigger for Seizure  . Codeine Nausea And Vomiting  . Keppra [Levetiracetam] Other (See Comments)    Causes seizures  . Pregabalin Nausea And Vomiting  . Olive Oil Rash    Patient Measurements: Height: 5\' 7"  (170.2 cm) Weight: 63 kg (138 lb 14.2 oz) IBW/kg (Calculated) : 61.6 Heparin Dosing Weight:  63 kg  Vital Signs: Temp: 99.1 F (37.3 C) (01/08 0614) Temp Source: Oral (01/08 0614) BP: 114/53 (01/08 2956) Pulse Rate: 71 (01/08 0614)  Labs: Recent Labs    08/09/20 0122 08/10/20 0325 08/10/20 1041 08/10/20 1805 08/11/20 0141 08/11/20 0949  HGB 9.9* 10.8*  --   --  10.3*  --   HCT 31.7* 33.9*  --   --  30.6*  --   PLT 264 253  --   --  301  --   HEPARINUNFRC  --  <0.10*   < > 0.26* 0.47 0.70  CREATININE 0.75  --   --   --   --   --    < > = values in this interval not displayed.    Estimated Creatinine Clearance: 70.9 mL/min (by C-G formula based on SCr of 0.75 mg/dL).   Assessment: 22 yr woman was admitted on 08/02/20 following a mechanical fall. Pt now with acute bilateral UE DVT (brachial veins) and R acute SVT of basilic vein. Pharmacy was consulted to dose IV heparin; per med rec, pt was on no anticoagulants PTA. Per primary team note, plan is to treat with IV heparin for 24-48 hrs, then switch to oral anticoagulation with apixaban. 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   Neurosurgery recommended starting heparin and getting repeat head CT  (CT done yesterday indicated no acute abnormality), if stable may switch to oral anticoagulation.  Heparin level remains therapeutic (0.7) on gtt at 1550 units/hr. Heparin level this AM is at upper end of goal  range, and patient noted to have some minor bleeding this morning which the MD was not concerned with per discussion with nursing. Will continue same heparin rate for now given likelihood of switching to oral anticoagulation and repeat CT yesterday negative for any acute abnormalities as well as prior subtherapeutic levels while at 1500 u/hr. Will check 8-hour heparin level to assess trend as patient remains on heparin.  Goal of Therapy:  Heparin level 0.3-0.7 units/ml  Monitor platelets by anticoagulation protocol: Yes   Plan:  Continue heparin infusion at 1550 units/hr F/u plans for oral anticoagulation F/u 8 hour heparin level  Margarite Gouge, PharmD PGY2 ID Pharmacy Resident Phone between 7 am - 3:30 pm: [phone]  Please check AMION for all [facility] Pharmacy phone numbers After 10:00 PM, call Main Pharmacy [phone]  08/11/2020  Addendum: Based on discussion with nursing team, did decide to reduce rate to 1500 u/hr in the afternoon on 1/8 and assess daily heparin level.

## 2020-08-11 NOTE — Social Work (Signed)
CSW met with patient to discuss the denial of CIR and to see if she wanted to pursue a SNF. Patient explained that she has been to a SNF before and left immediately. Patient requested that CSW reach out to MD to see if she could go home with [facility].  CSW reached out to MD Ghimire and agreed that patient could go home with [facility]. [place] orders will be put in. RN case manager notified.  TOC team will continue to assist with discharge planning needs.

## 2020-08-12 DIAGNOSIS — I4891 Unspecified atrial fibrillation: Secondary | ICD-10-CM | POA: Diagnosis not present

## 2020-08-12 DIAGNOSIS — W19XXXA Unspecified fall, initial encounter: Secondary | ICD-10-CM | POA: Diagnosis not present

## 2020-08-12 LAB — CULTURE, BLOOD (ROUTINE X 2)
Culture: NO GROWTH
Culture: NO GROWTH

## 2020-08-12 LAB — CBC
HCT: 31.5 % — ABNORMAL LOW (ref 36.0–46.0)
Hemoglobin: 10.4 g/dL — ABNORMAL LOW (ref 12.0–15.0)
MCH: 34 pg (ref 26.0–34.0)
MCHC: 33 g/dL (ref 30.0–36.0)
MCV: 102.9 fL — ABNORMAL HIGH (ref 80.0–100.0)
Platelets: 319 10*3/uL (ref 150–400)
RBC: 3.06 MIL/uL — ABNORMAL LOW (ref 3.87–5.11)
RDW: 12.5 % (ref 11.5–15.5)
WBC: 5.4 10*3/uL (ref 4.0–10.5)
nRBC: 0 % (ref 0.0–0.2)

## 2020-08-12 LAB — HEPARIN LEVEL (UNFRACTIONATED): Heparin Unfractionated: 0.59 IU/mL (ref 0.30–0.70)

## 2020-08-12 MED ORDER — PHENYTOIN SODIUM EXTENDED 100 MG PO CAPS: ORAL_CAPSULE | ORAL | 3 refills | Status: DC

## 2020-08-12 NOTE — Progress Notes (Signed)
[employer] for IV Heparin Indication:  Bilateral UE DVT  Allergies  Allergen Reactions  . Zonisamide Other (See Comments)    Numbness and tingling over whole body  . Chocolate Flavor Other (See Comments)    Trigger for Seizure  . Codeine Nausea And Vomiting  . Keppra [Levetiracetam] Other (See Comments)    Causes seizures  . Pregabalin Nausea And Vomiting  . Olive Oil Rash    Patient Measurements: Height: 5\' 7"  (170.2 cm) Weight: 63 kg (138 lb 14.2 oz) IBW/kg (Calculated) : 61.6 Heparin Dosing Weight:  63 kg  Vital Signs: Temp: 98.8 F (37.1 C) (01/09 0601) Temp Source: Oral (01/09 0601) BP: 100/51 (01/09 0601) Pulse Rate: 64 (01/09 0601)  Labs: Recent Labs    08/10/20 0325 08/10/20 1041 08/11/20 0141 08/11/20 0949 08/12/20 0155  HGB 10.8*  --  10.3*  --  10.4*  HCT 33.9*  --  30.6*  --  31.5*  PLT 253  --  301  --  319  HEPARINUNFRC <0.10*   < > 0.47 0.70 0.59   < > = values in this interval not displayed.    Estimated Creatinine Clearance: 70.9 mL/min (by C-G formula based on SCr of 0.75 mg/dL).   Assessment: 41 yr woman was admitted on 08/02/20 following a mechanical fall. Pt now with acute bilateral UE DVT (brachial veins) and R acute SVT of basilic vein. Pharmacy was consulted to dose IV heparin; per med rec, pt was on no anticoagulants PTA. Per primary team note, plan is to treat with IV heparin for 24-48 hrs, then switch to oral anticoagulation with apixaban. 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   Neurosurgery recommended starting heparin and getting repeat head CT  (CT done yesterday indicated no acute abnormality), if stable may switch to oral anticoagulation.  Heparin level  therapeutic (0.59) on gtt at 1500 units/hr. Will continue gtt rate and follow up with daily heparin level. Patient likely to be switched to oral anticoagulation soon.    Goal of Therapy:  Heparin level 0.3-0.7 units/ml  Monitor platelets by anticoagulation protocol: Yes   Plan:  Continue heparin infusion at 1500 units/hr F/u plans for oral anticoagulation F/u daily heparin level  Alfonse Spruce, PharmD PGY2 ID Pharmacy Resident Phone between 7 am - 3:30 pm: [phone]  Please check AMION for all [facility] phone numbers After 10:00 PM, call [facility] [phone]  08/12/2020

## 2020-08-12 NOTE — Discharge Summary (Signed)
Physician Discharge Summary  [name] [id number] DOB: 09-01-1955 DOA: 08/02/2020  PCP: [name], MD  Admit date: 08/02/2020 Discharge date: 08/12/2020  Admitted From: Home Disposition: Home with home health  Recommendations for Outpatient Follow-up:  1. Follow up with PCP in 1-2 weeks 2. Call and schedule follow-up with neurosurgery as instructed 3. 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 coli UTI, anxiety/depression, polyneuropathy and ambulatory dysfunction lives at home with her daughter presented to emergency room on 12/30 with fall at home associated with left hand pain, neck pain and occipital headache.  In the emergency room she was found to have new onset A. fib with RVR, urinalysis positive for pyuria, C2 lateral mass fracture, left hand and finger fracture, left pyelonephritis on CT scan.  She was seen by neurosurgery, recommended conservative management.  Left upper extremity splint.  Cardiology consulted for rapid A. fib.  Also found to have bilateral upper extremity brachial vein DVTs associated with IV line.   Assessment & plan of care on discharge:   Mechanical fall with left metacarpal and phalangeal fracture  - for nonoperative management and already improved and utilizing the hand. - C2 fracture, left lateral mass compression fracture with no cord compression and neurologically intact.  Seen by neurosurgery and they recommended conservative management.  Wearing cervical collar on mobility.  Patient is mostly not using it and has remained stable  without any neurological compromise. She will wear cervical collar on mobility, can take it out while in the bed. Call and schedule follow-up with neurosurgery in 2 weeks.  New onset rapid a flutter: Transient.  Treated with amiodarone, converted to sinus.  A flutter load was very low.  Now remains rate controlled sinus rhythm.  Cardiology recommended monitoring but no further treatment.  Patient is high risk for anticoagulation.  Electrolytes were replaced and normalized.  Right basilic and left basilic vein thrombosis with local swelling:  Probably related to IV line placement.  That is removed now.   Upper extremity DVTs distal to axillary vein that were associated with catheter has very rare risk of pulmonary embolism.  Patient is very high risk of bleeding from fall and history of traumatic brain injury and baseline ambulatory dysfunction and seizure disorder.   Discussed in detail with patient and her daughter, risk of bleeding from therapeutic anticoagulation outweigh any benefits of treating provoked upper extremity DVTs that are distal to axillary vein.  She will monitor for symptoms at home.   She was transiently treated with heparin infusion in the hospital.   Acute UTI present on admission: Completed treatment with meropenem and Rocephin.  Improved.  Seizure disorder: Subtherapeutic Dilantin level.  No evidence of recurrence of seizure.   -Patient on Vimpat 200 mg twice a day that she will continue. -Dilantin level was very low, she will do phenytoin 100 mg in the morning and 200 mg in the evening. She has been followed by neurology, they will continue to adjust further medications. Patient is on Neurontin for polyneuropathy and escitalopram for anxiety that she will continue.  Patient is physically deconditioned, she has chronic neuropathy and ambulatory dysfunction.  She was evaluated to  go to acute inpatient rehab, however insurance declined.  She stayed in the hospital and  received therapies with physical therapy and Occupational Therapy.  Patient and family are not interested to get rehab at the skilled nursing facility. Discussed in detail with patient and her daughter, they think she is at about her baseline and she has very good support system at home.  Her daughter is at home 24/7 and able to supervise. Will recommend all-time fall precaution, use walker and railings at home.  She will benefit with ongoing PT and OT at home.    Discharge Diagnoses:  Active Problems:   Fall    Discharge Instructions  Discharge Instructions    Diet general   Complete by: As directed    Discharge instructions   Complete by: As directed    Wear neck collar all the time when you are moving around , can take it off at bed Call and schedule follow up with neurosurgery for follow up All time fall precautions   Discharge instructions   Complete by: As directed    There are various instructions for dilantin, you shall be taking phenytoin 100 mg in the morning and 200 mg at night .   Increase activity slowly   Complete by: As directed    No wound care   Complete by: As directed    No wound care   Complete by: As directed      Allergies as of 08/12/2020      Reactions   Zonisamide Other (See Comments)   Numbness and tingling over whole body   Chocolate Flavor Other (See Comments)   Trigger for Seizure   Codeine Nausea And Vomiting   Keppra [levetiracetam] Other (See Comments)   Causes seizures   Pregabalin Nausea And Vomiting   Olive Oil Rash      Medication List    TAKE these medications   acetaminophen 500 MG tablet Commonly known as: TYLENOL Take 1,000 mg by mouth every 6 (six) hours as needed for mild pain.   albuterol 108 (90 Base) MCG/ACT inhaler Commonly known as: VENTOLIN HFA Inhale 1 puff into the lungs every 4 (four) hours as needed for wheezing or shortness of breath.   cholecalciferol 25 MCG (1000 UNIT) tablet Commonly known as: VITAMIN  D3 Take 1 tablet (1,000 Units total) by mouth daily.   [employer] B-12 500 MCG tablet Generic drug: vitamin B-12 TAKE 2 TABLETS BY MOUTH EVERY DAY What changed: how much to take   escitalopram 10 MG tablet Commonly known as: Lexapro Take 1 tablet (10 mg total) by mouth daily.   gabapentin 300 MG capsule Commonly known as: NEURONTIN Take 1 capsule every night What changed:   how much to take  how to take this  when to take this  additional instructions   lacosamide 200 MG Tabs tablet Commonly known as: VIMPAT Take 1 tablet (200 mg total) by mouth 2 (two) times daily.   LORazepam 1 MG tablet Commonly known as: Ativan Take 1 tablet as needed for seizure. Do not take more than 2 in 24 hours. What changed:   how much to take  how to take this  when to take this   phenytoin 100 MG ER capsule Commonly known as: DILANTIN 100mg  in AM, 200 mg in PM What changed:   additional instructions  Another medication with the same name was removed. Continue taking this medication, and follow the directions you see here.   Xcopri 100 MG Tabs Generic  drug: Cenobamate Take 1 tablet by mouth daily. What changed: how much to take       Follow-up Information    Almyra Deforest, [place] Follow up.   Specialties: Cardiology, Radiology Why: Hospital follow-up with Cardiology scheduled for 09/11/2020 at 3:45pm with Almyra Deforest, one of our office's PAs. Please arrive 15 minutes early for check-in. If this date/time does not work for you, please call our office to reschedule. Contact information: [address] [place] [address] [phone]        [facility] Follow up.   Specialty: Home Health Services Why: Will follow you at home for physical therapy. Contact information: [place] [id number] [phone]        Consuella Lose, MD. Call in 1 day(s).   Specialty: Neurosurgery Contact information: [address] [place] [address] [phone]              Allergies  Allergen Reactions  . Zonisamide Other (See Comments)    Numbness and tingling over whole body  . Chocolate Flavor Other (See Comments)    Trigger for Seizure  . Codeine Nausea And Vomiting  . Keppra [Levetiracetam] Other (See Comments)    Causes seizures  . Pregabalin Nausea And Vomiting  . Olive Oil Rash    Consultations:  Neurosurgery  Cardiology   Procedures/Studies: CT HEAD WO CONTRAST  Result Date: 08/10/2020 CLINICAL DATA:  [place]. EXAM: CT HEAD WITHOUT CONTRAST TECHNIQUE: Contiguous axial images were obtained from the base of the skull through the vertex without intravenous contrast. 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 Stable ex vacuo enlargement of the adjacent right lateral ventricle. Stable moderate enlargement of the remainder of the ventricles with mild to moderate diffuse enlargement of the subarachnoid spaces. STABLE MINIMAL WHITE No intracranial hemorrhage, mass lesion or CT evidence of acute infarction. Vascular: No hyperdense vessel or unexpected calcification. Skull: Stable right posterior parietal post craniotomy changes. No acute fracture seen. Sinuses/Orbits: Unremarkable. Other: None. IMPRESSION: 1. No acute abnormality. 2. 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 3. Stable mild-to-moderate diffuse cerebral and cerebellar atrophy. Electronically Signed   By: Claudie Revering M.D.   On: 08/10/2020 17:15   CT HEAD WO CONTRAST  Result Date: 08/02/2020 CLINICAL DATA:  Status post fall on stairs today. Neck pain. Initial encounter. EXAM: CT HEAD WITHOUT CONTRAST CT MAXILLOFACIAL WITHOUT CONTRAST CT CERVICAL SPINE WITHOUT CONTRAST TECHNIQUE: Multidetector CT imaging of the head, cervical spine, and maxillofacial structures were performed using the standard protocol  without intravenous contrast. Multiplanar CT image reconstructions of the cervical spine and maxillofacial structures were also generated. COMPARISON:  Head and cervical spine CT scan 04/24/2020. FINDINGS: CT HEAD FINDINGS Brain: No evidence of acute infarction, hemorrhage, hydrocephalus, extra-axial collection or mass lesion/mass effect. 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 Vascular: No hyperdense vessel or unexpected calcification. Skull: No acute abnormality.  Remote right craniotomy again seen. Other: None. CT MAXILLOFACIAL FINDINGS Osseous: No fracture or mandibular dislocation. No destructive process. Orbits: Negative. No traumatic or inflammatory finding. Sinuses: Clear. Soft tissues: Negative. CT CERVICAL SPINE FINDINGS Alignment: Maintained. Skull base and vertebrae: The patient has an acute fracture of the anterior aspect of the left lateral mass C2. There is slight anterior displacement of the fracture fragment and minimal impaction. No other fracture is identified. No lytic or sclerotic lesion. Soft tissues and  spinal canal: No prevertebral fluid or swelling. No visible canal hematoma. Disc levels: Loss of disc space height and endplate spurring most notable at C5-6 and C6-7. Scattered facet degenerative change is worst on the left at C4-5. Upper chest: Lung apices are clear with centrilobular emphysema noted. Other: None. IMPRESSION: Mildly impacted and anteriorly displaced fracture of the left lateral mass of C2. No other acute abnormality head, face or cervical spine. Postoperative change of right craniotomy with underlying encephalomalacia, unchanged. 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 Emphysema (ICD10-J43.9). Critical Value/emergent results were called by telephone at the time of interpretation on 08/02/2020 at 1:49 pm to provider Sherwood Gambler , who verbally acknowledged these results. Electronically Signed   By: Inge Rise M.D.   On: 08/02/2020 13:51   CT CERVICAL SPINE WO CONTRAST  Result Date: 08/02/2020 CLINICAL DATA:  Status post fall on stairs today. Neck pain. Initial encounter. EXAM: CT HEAD WITHOUT CONTRAST CT MAXILLOFACIAL WITHOUT CONTRAST CT CERVICAL SPINE WITHOUT CONTRAST TECHNIQUE: Multidetector CT imaging of the head, cervical spine, and maxillofacial structures were performed using the standard protocol without intravenous contrast. Multiplanar CT image reconstructions of the cervical spine and maxillofacial structures were also generated. COMPARISON:  Head and cervical spine CT scan 04/24/2020. FINDINGS: CT HEAD FINDINGS Brain: No evidence of acute infarction, hemorrhage, hydrocephalus, extra-axial collection or mass lesion/mass effect. 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 Vascular: No hyperdense vessel or unexpected calcification. Skull: No acute abnormality.  Remote right craniotomy again seen. Other: None. CT MAXILLOFACIAL FINDINGS Osseous: No fracture or mandibular dislocation. No destructive process. Orbits: Negative. No traumatic or inflammatory finding. Sinuses: Clear. Soft tissues: Negative. CT CERVICAL SPINE FINDINGS Alignment: Maintained. Skull base and vertebrae: The patient has an acute fracture of the anterior aspect of the left lateral mass C2. There is slight anterior displacement of the fracture fragment and minimal impaction. No other fracture is identified. No lytic or sclerotic lesion. Soft tissues and spinal canal: No prevertebral fluid or swelling. No visible canal hematoma. Disc levels: Loss of disc space height and endplate spurring most notable at C5-6 and C6-7. Scattered facet degenerative change is worst on the left at C4-5. Upper chest: Lung apices are clear with centrilobular emphysema noted. Other: None. IMPRESSION: Mildly impacted and anteriorly displaced fracture of the left lateral mass of C2. No other acute  abnormality head, face or cervical spine. Postoperative change of right craniotomy with underlying encephalomalacia, unchanged. 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 Emphysema (ICD10-J43.9). Critical Value/emergent results were called by telephone at the time of interpretation on 08/02/2020 at 1:49 pm to provider Sherwood Gambler , who verbally acknowledged these results. Electronically Signed   By: Inge Rise M.D.   On: 08/02/2020 13:51   CT CHEST ABDOMEN PELVIS W CONTRAST  Result Date: 08/02/2020 CLINICAL DATA:  65 year old female with a history of fall EXAM: CT CHEST, ABDOMEN, AND PELVIS WITH CONTRAST TECHNIQUE: Multidetector CT imaging of the chest, abdomen and pelvis was performed following the standard protocol during bolus administration of intravenous contrast. CONTRAST:  162mL OMNIPAQUE IOHEXOL 300 MG/ML  SOLN COMPARISON:  Chest CT 08/26/2019, FINDINGS: CT CHEST FINDINGS Cardiovascular: Heart size within normal limits. Mild pericardial fluid/thickening, relatively unchanged from the comparison CT. Calcifications of the aortic valve. No significant coronary calcifications. Unremarkable course caliber and contour of the thoracic aorta. Mild atherosclerotic changes. Branch vessels are patent. Mild atherosclerosis of the thoracic aorta. No filling defects within the proximal pulmonary arteries. Mediastinum/Nodes: Unremarkable thoracic inlet. Unremarkable thoracic  esophagus. Small lymph nodes of the mediastinum. Lungs/Pleura: Paraseptal and centrilobular emphysema. No endotracheal/endobronchial debris. No pneumothorax or pleural effusion. No confluent airspace disease. Atelectatic changes at the left lung base. Musculoskeletal: Degenerative changes of the lower cervical and the thoracic spine. No bony canal narrowing. No acute fracture line identified. No acute displaced rib fracture. CT ABDOMEN PELVIS FINDINGS Hepatobiliary: Unremarkable liver.  Unremarkable gallbladder. Pancreas: Unremarkable  Spleen: Unremarkable Adrenals/Urinary Tract: - Right adrenal gland:  Unremarkable - Left adrenal gland: Unremarkable. - Right kidney: No hydronephrosis, nephrolithiasis, inflammation, or ureteral dilation. No focal lesion. - Left Kidney: No hydronephrosis. No nephrolithiasis. There is a wedge-shaped region of heterogeneous enhancement and loss of the corticomedullary junction in the posterior left kidney involving both cortex and the medullary tissue. This is most apparent on the delayed renal imaging. There is a second region more anteriorly decreased attenuation involving the corticomedullary region. No adenopathy. Circumferential enhancement of the urothelium in the left renal pelvis is present, asymmetric from the right. No nephrolithiasis. No ureteral dilation. - Urinary Bladder: Urinary bladder relatively decompressed. Stomach/Bowel: - Stomach: Hiatal hernia.  Otherwise unremarkable stomach. - Small bowel: Unremarkable - Appendix: Normal. - Colon: Unremarkable. Vascular/Lymphatic: Atherosclerotic changes of the lower thoracic aorta and the abdominal aorta. No aneurysm or dissection. Atherosclerotic changes of the bilateral iliac arteries which remain patent. Proximal profunda femoris and SFA patent bilaterally. Unremarkable appearance of the IVC. There is compression of the left common iliac vein in the typical location below the right common iliac artery in a May-Thurner configuration. Associated small caliber left common iliac vein and external iliac vein. Collateral venous drainage involves the anterior pelvic region from the left great saphenous to the right great saphenous via the pudendal/epigastric vasculature. Engorged left gonadal vein. Reproductive: Unremarkable uterus and adnexa. Other: None Musculoskeletal: Negative for acute fracture or malalignment of the spine. Degenerative changes of the lumbar region. No bony canal narrowing. Minimal degenerative changes of the hips. IMPRESSION: No acute  traumatic finding of the chest, abdomen, pelvis CT. There is a wedge shaped lesion within the posterior left kidney, involving both the cortical and medullary tissue with an additional smaller focus of the anterior left kidney. Differential diagnosis for this appearance is pyelonephritis (which is favored) as well as renal malignancy such as renal lymphoma or urothelial carcinoma. Correlation with urinalysis and/or urine cytology may be useful, as well as short-term interval follow-up contrast-enhanced abdominal CT after therapy. Aortic Atherosclerosis (ICD10-I70.0) and Emphysema (ICD10-J43.9). 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 Correlation with any left-sided chronic venous symptoms may be useful. Electronically Signed   By: Corrie Mckusick D.O.   On: 08/02/2020 13:50   DG Chest Portable 1 View  Result Date: 08/02/2020 CLINICAL DATA:  Chest pain following a fall down stairs. EXAM: PORTABLE CHEST 1 VIEW COMPARISON:  04/02/2020 FINDINGS: Normal sized heart. Mildly tortuous and calcified thoracic aorta. Clear lungs. Old, healed right clavicle fracture. Stable old, healed right 4th lateral rib fracture. Stable mild to moderate thoracolumbar scoliosis. No acute fracture or pneumothorax seen. IMPRESSION: No acute abnormality. Electronically Signed   By: Claudie Revering M.D.   On: 08/02/2020 12:01   DG Hand Complete Left  Result Date: 08/02/2020 CLINICAL DATA:  Left middle finger pain following a fall down stairs. EXAM: LEFT HAND - COMPLETE 3+ VIEW COMPARISON:  03/12/2006 FINDINGS: Acute fracture at the ulnar aspect of the base of the 2nd proximal phalanx extending into the 2nd MCP joint. Mild displacement of the corner fragment. There is also dorsal and proximal  dislocation of the 3rd middle phalanx relative to the proximal phalanx without associated visible fracture. IMPRESSION: 1. Fracture of the base of the 2nd proximal phalanx with associated intra-articular extension. 2. Dislocation at the 3rd  PIP joint. Electronically Signed   By: Claudie Revering M.D.   On: 08/02/2020 12:04   DG Finger Middle Left  Result Date: 08/02/2020 CLINICAL DATA:  Status post reduction of a left 3rd PIP joint dislocation. EXAM: LEFT MIDDLE FINGER 2+V COMPARISON:  None. FINDINGS: The previously demonstrated 3rd PIP joint dislocation has been reduced. There are tiny avulsion fracture fragments ventral and dorsal to the joint. There is associated soft tissue swelling. The previously noted fracture of the base of the 2nd proximal phalanx is unchanged. IMPRESSION: 1. Status post reduction of the previously demonstrated 3rd PIP joint dislocation with tiny avulsion fracture fragments ventral and dorsal to the joint. 2. Stable fracture of the base of the 2nd proximal phalanx with intra-articular extension. Electronically Signed   By: Claudie Revering M.D.   On: 08/02/2020 12:46   ECHOCARDIOGRAM COMPLETE  Result Date: 08/03/2020    ECHOCARDIOGRAM REPORT   Patient Name:   [name] Date of Exam: 08/03/2020 Medical Rec #:  [id number]      Height:       67.0 in Accession #:    [id number]     Weight:       138.9 lb Date of Birth:  13-Jan-1956      BSA:          1.732 m Patient Age:    68 years       BP:           92/56 mmHg Patient Gender: F              HR:           127 bpm. Exam Location:  Inpatient Procedure: 2D Echo, Cardiac Doppler and Color Doppler Indications:    R55 Syncope; I48.91* Unspeicified atrial fibrillation  History:        Patient has no prior history of Echocardiogram examinations.                 Abnormal ECG, COPD and Stroke, Arrythmias:Atrial Flutter and                 Atrial Fibrillation, Signs/Symptoms:Fever and Chest Pain; Risk                 Factors:Current Smoker. Hypoxia. History of covid infection.  Sonographer:    Roseanna Rainbow RDCS Referring Phys: [id number] [place]  Sonographer Comments: Technically difficult study due to poor echo windows. Cervical brace. IMPRESSIONS  1. Left ventricular ejection  fraction, by estimation, is 55 to 60%. The left ventricle has normal function. The left ventricle has no regional wall motion abnormalities. Left ventricular diastolic parameters are indeterminate.  2. Right ventricular systolic function is normal. The right ventricular size is normal. There is mildly elevated pulmonary artery systolic pressure. The estimated right ventricular systolic pressure is 53.2 mmHg.  3. The mitral valve is normal in structure. No evidence of mitral valve regurgitation. No evidence of mitral stenosis.  4. The aortic valve is tricuspid. Aortic valve regurgitation is not visualized. No aortic stenosis is present.  5. The inferior vena cava is dilated in size with <50% respiratory variability, suggesting right atrial pressure of 15 mmHg. FINDINGS  Left Ventricle: Left ventricular ejection fraction, by estimation, is 55 to 60%. The left ventricle has normal function.  The left ventricle has no regional wall motion abnormalities. The left ventricular internal cavity size was normal in size. There is  no left ventricular hypertrophy. Left ventricular diastolic parameters are indeterminate. Right Ventricle: The right ventricular size is normal. No increase in right ventricular wall thickness. Right ventricular systolic function is normal. There is mildly elevated pulmonary artery systolic pressure. The tricuspid regurgitant velocity is 2.44  m/s, and with an assumed right atrial pressure of 15 mmHg, the estimated right ventricular systolic pressure is [id number] mmHg. Left Atrium: Left atrial size was normal in size. Right Atrium: Right atrial size was normal in size. Pericardium: Trivial pericardial effusion is present. Mitral Valve: The mitral valve is normal in structure. No evidence of mitral valve regurgitation. No evidence of mitral valve stenosis. Tricuspid Valve: The tricuspid valve is normal in structure. Tricuspid valve regurgitation is trivial. Aortic Valve: The aortic valve is tricuspid.  Aortic valve regurgitation is not visualized. No aortic stenosis is present. Pulmonic Valve: The pulmonic valve was normal in structure. Pulmonic valve regurgitation is not visualized. Aorta: The aortic root is normal in size and structure. Venous: The inferior vena cava is dilated in size with less than 50% respiratory variability, suggesting right atrial pressure of 15 mmHg. IAS/Shunts: No atrial level shunt detected by color flow Doppler.  LEFT VENTRICLE PLAX 2D LVIDd:         4.20 cm LVIDs:         3.30 cm LV PW:         1.30 cm LV IVS:        0.90 cm LVOT diam:     1.80 cm LV SV:         36 LV SV Index:   21 LVOT Area:     2.54 cm  LV Volumes (MOD) LV vol d, MOD A2C: 43.3 ml LV vol d, MOD A4C: 40.3 ml LV vol s, MOD A2C: 15.2 ml LV vol s, MOD A4C: 18.4 ml LV SV MOD A2C:     28.1 ml LV SV MOD A4C:     40.3 ml LV SV MOD BP:      26.6 ml RIGHT VENTRICLE             IVC RV S prime:     14.30 cm/s  IVC diam: 2.50 cm TAPSE (M-mode): 2.1 cm LEFT ATRIUM           Index       RIGHT ATRIUM           Index LA diam:      3.40 cm 1.96 cm/m  RA Area:     10.70 cm LA Vol (A2C): 15.7 ml 9.07 ml/m  RA Volume:   23.30 ml  13.45 ml/m LA Vol (A4C): 22.0 ml 12.70 ml/m  AORTIC VALVE LVOT Vmax:   111.00 cm/s LVOT Vmean:  72.000 cm/s LVOT VTI:    0.143 m  AORTA Ao Root diam: 2.70 cm Ao Asc diam:  2.70 cm MITRAL VALVE               TRICUSPID VALVE MV Area (PHT): 3.46 cm    TR Peak grad:   23.8 mmHg MV Decel Time: 219 msec    TR Vmax:        244.00 cm/s MV E velocity: 71.57 cm/s                            SHUNTS  Systemic VTI:  0.14 m                            Systemic Diam: 1.80 cm Marca Anconaalton Mclean MD Electronically signed by Marca Anconaalton Mclean MD Signature Date/Time: 08/03/2020/4:33:28 PM    Final    VAS US UPPER EXTREMITY VENOUS DUPLEX  Result Date: 08/09/2020 UPPER VENOUS STUDY  Indications: Pain, and Swelling Other Indications: Previous PICC. Risk Factors: DVT 2014 LLE DVT. Comparison Study: No previous  Upper ext duplex. Performing Technologist: Clint GuyLisa Gibson RVT  Examination Guidelines: A complete evaluation includes B-mode imaging, spectral Doppler, color Doppler, and power Doppler as needed of all accessible portions of each vessel. Bilateral testing is considered an integral part of a complete examination. Limited examinations for reoccurring indications may be performed as noted.  Right Findings: +----------+------------+---------+-----------+----------+-------+ RIGHT     CompressiblePhasicitySpontaneousPropertiesSummary +----------+------------+---------+-----------+----------+-------+ IJV           Full       Yes       Yes                      +----------+------------+---------+-----------+----------+-------+ Subclavian    Full       Yes       Yes                      +----------+------------+---------+-----------+----------+-------+ Axillary      Full       Yes       Yes                      +----------+------------+---------+-----------+----------+-------+ Brachial      None                                          +----------+------------+---------+-----------+----------+-------+ Radial        Full                                          +----------+------------+---------+-----------+----------+-------+ Ulnar         Full                                          +----------+------------+---------+-----------+----------+-------+ Cephalic      Full                                          +----------+------------+---------+-----------+----------+-------+ Basilic       None                                          +----------+------------+---------+-----------+----------+-------+  Left Findings: +----------+------------+---------+-----------+----------+-------+ LEFT      CompressiblePhasicitySpontaneousPropertiesSummary +----------+------------+---------+-----------+----------+-------+ IJV           Full       Yes       Yes                       +----------+------------+---------+-----------+----------+-------+ Subclavian  Full       Yes       Yes                      +----------+------------+---------+-----------+----------+-------+ Axillary      Full       Yes       Yes                      +----------+------------+---------+-----------+----------+-------+ Brachial      None                                          +----------+------------+---------+-----------+----------+-------+ Radial        Full                                          +----------+------------+---------+-----------+----------+-------+ Ulnar         Full                                          +----------+------------+---------+-----------+----------+-------+ Cephalic      Full                                          +----------+------------+---------+-----------+----------+-------+ Basilic       Full                                          +----------+------------+---------+-----------+----------+-------+  Summary:  Right: Findings consistent with acute deep vein thrombosis involving the right brachial veins. Findings consistent with acute superficial vein thrombosis involving the right basilic vein. Duplicate brachial system with both being non-compressible.  Left: Findings consistent with acute deep vein thrombosis involving the left brachial veins. Duplicate brachial system with one being compressible and one is not.  *See table(s) above for measurements and observations.  Diagnosing physician: Harold Barban MD Electronically signed by Harold Barban MD on 08/09/2020 at 3:38:41 PM.    Final    CT Maxillofacial Wo Contrast  Result Date: 08/02/2020 CLINICAL DATA:  Status post fall on stairs today. Neck pain. Initial encounter. EXAM: CT HEAD WITHOUT CONTRAST CT MAXILLOFACIAL WITHOUT CONTRAST CT CERVICAL SPINE WITHOUT CONTRAST TECHNIQUE: Multidetector CT imaging of the head, cervical spine, and maxillofacial structures  were performed using the standard protocol without intravenous contrast. Multiplanar CT image reconstructions of the cervical spine and maxillofacial structures were also generated. COMPARISON:  Head and cervical spine CT scan 04/24/2020. FINDINGS: CT HEAD FINDINGS Brain: No evidence of acute infarction, hemorrhage, hydrocephalus, extra-axial collection or mass lesion/mass effect. 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 Vascular: No hyperdense vessel or unexpected calcification. Skull: No acute abnormality.  Remote right craniotomy again seen. Other: None. CT MAXILLOFACIAL FINDINGS Osseous: No fracture or mandibular dislocation. No destructive process. Orbits: Negative. No traumatic or inflammatory finding. Sinuses: Clear. Soft tissues: Negative. CT CERVICAL SPINE FINDINGS Alignment: Maintained. Skull base and vertebrae: The patient has an acute fracture of the anterior aspect of the left lateral  mass C2. There is slight anterior displacement of the fracture fragment and minimal impaction. No other fracture is identified. No lytic or sclerotic lesion. Soft tissues and spinal canal: No prevertebral fluid or swelling. No visible canal hematoma. Disc levels: Loss of disc space height and endplate spurring most notable at C5-6 and C6-7. Scattered facet degenerative change is worst on the left at C4-5. Upper chest: Lung apices are clear with centrilobular emphysema noted. Other: None. IMPRESSION: Mildly impacted and anteriorly displaced fracture of the left lateral mass of C2. No other acute abnormality head, face or cervical spine. Postoperative change of right craniotomy with underlying encephalomalacia, unchanged. 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 Emphysema (ICD10-J43.9). Critical Value/emergent results were called by telephone at the time of interpretation on 08/02/2020 at 1:49 pm to provider Sherwood Gambler , who verbally acknowledged these  results. Electronically Signed   By: Inge Rise M.D.   On: 08/02/2020 13:51   (Echo, Carotid, EGD, Colonoscopy, ERCP)    Subjective: Patient seen and examined.  No overnight events.  Denies any pain. Eager to go home.  Her daughter on the phone and we discussed about patient's discharge planning and they all agreed that she should come home.   Discharge Exam: Vitals:   08/11/20 2126 08/12/20 0601  BP: 125/72 (!) 100/51  Pulse: 76 64  Resp: 18 17  Temp: 98.4 F (36.9 C) 98.8 F (37.1 C)  SpO2: 93% 91%   Vitals:   08/11/20 0614 08/11/20 1524 08/11/20 2126 08/12/20 0601  BP: (!) 114/53 (!) 105/53 125/72 (!) 100/51  Pulse: 71 80 76 64  Resp: 17 17 18 17   Temp: 99.1 F (37.3 C) 98.3 F (36.8 C) 98.4 F (36.9 C) 98.8 F (37.1 C)  TempSrc: Oral Oral Oral Oral  SpO2: 94% 93% 93% 91%  Weight:      Height:        General: Pt is alert, awake, not in acute distress Not wearing cervical collar when moving her neck around.  Not in any pain. Cardiovascular: RRR, S1/S2 +, no rubs, no gallops Respiratory: CTA bilaterally, no wheezing, no rhonchi Abdominal: Soft, NT, ND, bowel sounds + Extremities: no edema, no cyanosis    The results of significant diagnostics from this hospitalization (including imaging, microbiology, ancillary and laboratory) are listed below for reference.     Microbiology: Recent Results (from the past 240 hour(s))  Resp Panel by RT-PCR (Flu A&B, Covid) Nasopharyngeal Swab     Status: None   Collection Time: 08/02/20 11:56 AM   Specimen: Nasopharyngeal Swab; Nasopharyngeal(NP) swabs in vial transport medium  Result Value Ref Range Status   SARS Coronavirus 2 by RT PCR NEGATIVE NEGATIVE Final    Comment: (NOTE) SARS-CoV-2 target nucleic acids are NOT DETECTED.  The SARS-CoV-2 RNA is generally detectable in upper respiratory specimens during the acute phase of infection. The lowest concentration of SARS-CoV-2 viral copies this assay can detect  is 138 copies/mL. A negative result does not preclude SARS-Cov-2 infection and should not be used as the sole basis for treatment or other patient management decisions. A negative result may occur with  improper specimen collection/handling, submission of specimen other than nasopharyngeal swab, presence of viral mutation(s) within the areas targeted by this assay, and inadequate number of viral copies(<138 copies/mL). A negative result must be combined with clinical observations, patient history, and epidemiological information. The expected result is Negative.  Fact Sheet for Patients:  EntrepreneurPulse.com.au  Fact Sheet for Healthcare Providers:  IncredibleEmployment.be  This test  is no t yet approved or cleared by the [place] and  has been authorized for detection and/or diagnosis of SARS-CoV-2 by FDA under an Emergency Use Authorization (EUA). This EUA will remain  in effect (meaning this test can be used) for the duration of the COVID-19 declaration under Section 564(b)(1) of the Act, 21 U.S.C.section 360bbb-3(b)(1), unless the authorization is terminated  or revoked sooner.       Influenza A by PCR NEGATIVE NEGATIVE Final   Influenza B by PCR NEGATIVE NEGATIVE Final    Comment: (NOTE) The Xpert Xpress SARS-CoV-2/FLU/RSV plus assay is intended as an aid in the diagnosis of influenza from Nasopharyngeal swab specimens and should not be used as a sole basis for treatment. Nasal washings and aspirates are unacceptable for Xpert Xpress SARS-CoV-2/FLU/RSV testing.  Fact Sheet for Patients: EntrepreneurPulse.com.au  Fact Sheet for Healthcare Providers: IncredibleEmployment.be  This test is not yet approved or cleared by the [place] FDA and has been authorized for detection and/or diagnosis of SARS-CoV-2 by FDA under an Emergency Use Authorization (EUA). This EUA will remain in effect  (meaning this test can be used) for the duration of the COVID-19 declaration under Section 564(b)(1) of the Act, 21 U.S.C. section 360bbb-3(b)(1), unless the authorization is terminated or revoked.  Performed at [facility], [place] [address] [place], [place] [address]   Urine culture     Status: Abnormal   Collection Time: 08/02/20  2:00 PM   Specimen: Urine, Random  Result Value Ref Range Status   Specimen Description URINE, RANDOM  Final   Special Requests   Final    NONE Performed at [facility], [place] [address] [place], [place] [address]    Culture >=100,000 COLONIES/mL ESCHERICHIA COLI (A)  Final   Report Status 08/04/2020 FINAL  Final   Organism ID, Bacteria ESCHERICHIA COLI (A)  Final      Susceptibility   Escherichia coli - MIC*    AMPICILLIN <=2 SENSITIVE Sensitive     CEFAZOLIN <=4 SENSITIVE Sensitive     CEFEPIME <=0.12 SENSITIVE Sensitive     CEFTRIAXONE <=0.25 SENSITIVE Sensitive     CIPROFLOXACIN <=0.25 SENSITIVE Sensitive     GENTAMICIN <=1 SENSITIVE Sensitive     IMIPENEM <=0.25 SENSITIVE Sensitive     NITROFURANTOIN <=16 SENSITIVE Sensitive     TRIMETH/SULFA <=20 SENSITIVE Sensitive     AMPICILLIN/SULBACTAM <=2 SENSITIVE Sensitive     PIP/TAZO <=4 SENSITIVE Sensitive     * >=100,000 COLONIES/mL ESCHERICHIA COLI  Culture, blood (routine x 2)     Status: None   Collection Time: 08/02/20 11:20 PM   Specimen: BLOOD RIGHT HAND  Result Value Ref Range Status   Specimen Description BLOOD RIGHT HAND  Final   Special Requests   Final    BOTTLES DRAWN AEROBIC AND ANAEROBIC Blood Culture adequate volume   Culture   Final    NO GROWTH 5 DAYS Performed at [facility], [address] [place], [place] [address]    Report Status 08/08/2020 FINAL  Final  Culture, blood (routine x 2)     Status: None   Collection Time: 08/02/20 11:35 PM   Specimen: BLOOD  Result Value Ref Range Status   Specimen Description BLOOD RT THUMB  Final   Special  Requests AEROBIC BOTTLE ONLY Blood Culture adequate volume  Final   Culture   Final    NO GROWTH 5 DAYS Performed at [facility]  [address], [place], [place] [address]    Report Status 08/08/2020 FINAL  Final  Culture, blood (routine x 2)     Status: None (Preliminary result)   Collection Time: 08/07/20  1:02 PM   Specimen: BLOOD LEFT HAND  Result Value Ref Range Status   Specimen Description BLOOD LEFT HAND  Final   Special Requests   Final    BOTTLES DRAWN AEROBIC AND ANAEROBIC Blood Culture results may not be optimal due to an inadequate volume of blood received in culture bottles   Culture   Final    NO GROWTH 4 DAYS Performed at [facility], [place] [address] [place], [place] [address]    Report Status PENDING  Incomplete  Culture, blood (routine x 2)     Status: None (Preliminary result)   Collection Time: 08/07/20  1:03 PM   Specimen: BLOOD LEFT HAND  Result Value Ref Range Status   Specimen Description BLOOD LEFT HAND  Final   Special Requests   Final    BOTTLES DRAWN AEROBIC AND ANAEROBIC Blood Culture results may not be optimal due to an inadequate volume of blood received in culture bottles   Culture   Final    NO GROWTH 4 DAYS Performed at [facility], [place] [address] [place], [place] [address]    Report Status PENDING  Incomplete  Resp Panel by RT-PCR (Flu A&B, Covid) Nasopharyngeal Swab     Status: None   Collection Time: 08/07/20  3:49 PM   Specimen: Nasopharyngeal Swab; Nasopharyngeal(NP) swabs in vial transport medium  Result Value Ref Range Status   SARS Coronavirus 2 by RT PCR NEGATIVE NEGATIVE Final    Comment: (NOTE) SARS-CoV-2 target nucleic acids are NOT DETECTED.  The SARS-CoV-2 RNA is generally detectable in upper respiratory specimens during the acute phase of infection. The lowest concentration of SARS-CoV-2 viral copies this assay can detect is 138 copies/mL. A negative result does not preclude SARS-Cov-2 infection and  should not be used as the sole basis for treatment or other patient management decisions. A negative result may occur with  improper specimen collection/handling, submission of specimen other than nasopharyngeal swab, presence of viral mutation(s) within the areas targeted by this assay, and inadequate number of viral copies(<138 copies/mL). A negative result must be combined with clinical observations, patient history, and epidemiological information. The expected result is Negative.  Fact Sheet for Patients:  EntrepreneurPulse.com.au  Fact Sheet for Healthcare Providers:  IncredibleEmployment.be  This test is no t yet approved or cleared by the [place] FDA and  has been authorized for detection and/or diagnosis of SARS-CoV-2 by FDA under an Emergency Use Authorization (EUA). This EUA will remain  in effect (meaning this test can be used) for the duration of the COVID-19 declaration under Section 564(b)(1) of the Act, 21 U.S.C.section 360bbb-3(b)(1), unless the authorization is terminated  or revoked sooner.       Influenza A by PCR NEGATIVE NEGATIVE Final   Influenza B by PCR NEGATIVE NEGATIVE Final    Comment: (NOTE) The Xpert Xpress SARS-CoV-2/FLU/RSV plus assay is intended as an aid in the diagnosis of influenza from Nasopharyngeal swab specimens and should not be used as a sole basis for treatment. Nasal washings and aspirates are unacceptable for Xpert Xpress SARS-CoV-2/FLU/RSV testing.  Fact Sheet for Patients: EntrepreneurPulse.com.au  Fact Sheet for Healthcare Providers: IncredibleEmployment.be  This test is not yet approved or cleared by the [place] FDA and has been authorized for detection and/or diagnosis of SARS-CoV-2  by FDA under an Emergency Use Authorization (EUA). This EUA will remain in effect (meaning this test can be used) for the duration of the COVID-19 declaration  under Section 564(b)(1) of the Act, 21 U.S.C. section 360bbb-3(b)(1), unless the authorization is terminated or revoked.  Performed at [facility], [place] [address] [place], [place] [address]      Labs: BNP (last 3 results) Recent Labs    08/26/19 0322  BNP [id number]   Basic Metabolic Panel: Recent Labs  Lab 08/06/20 0053 08/07/20 0228 08/08/20 0108 08/09/20 0122  NA 138 140 141 140  K 4.1 3.7 3.4* 4.6  CL 104 105 103 105  CO2 23 26 28 24   GLUCOSE 138* 106* 105* 88  BUN <5* <5* <5* 7*  CREATININE 0.73 0.67 0.67 0.75  CALCIUM 7.7* 8.3* 8.1* 8.2*  MG 2.1 1.8 1.8 2.3   Liver Function Tests: Recent Labs  Lab 08/06/20 0053 08/07/20 0228 08/08/20 0108  AST 26 22 20   ALT 18 18 17   ALKPHOS 45 48 52  BILITOT 0.5 0.5 0.4  PROT 4.3* 5.0* 5.1*  ALBUMIN 1.8* 2.0* 2.0*   No results for input(s): LIPASE, AMYLASE in the last 168 hours. No results for input(s): AMMONIA in the last 168 hours. CBC: Recent Labs  Lab 08/08/20 0108 08/09/20 0122 08/10/20 0325 08/11/20 0141 08/12/20 0155  WBC 7.1 5.7 5.1 5.0 5.4  HGB 10.0* 9.9* 10.8* 10.3* 10.4*  HCT 31.5* 31.7* 33.9* 30.6* 31.5*  MCV 103.6* 105.3* 103.4* 104.1* 102.9*  PLT 249 264 253 301 319   Cardiac Enzymes: No results for input(s): CKTOTAL, CKMB, CKMBINDEX, TROPONINI in the last 168 hours. BNP: Invalid input(s): POCBNP CBG: No results for input(s): GLUCAP in the last 168 hours. D-Dimer No results for input(s): DDIMER in the last 72 hours. Hgb A1c No results for input(s): HGBA1C in the last 72 hours. Lipid Profile No results for input(s): CHOL, HDL, LDLCALC, TRIG, CHOLHDL, LDLDIRECT in the last 72 hours. Thyroid function studies No results for input(s): TSH, T4TOTAL, T3FREE, THYROIDAB in the last 72 hours.  Invalid input(s): FREET3 Anemia work up No results for input(s): VITAMINB12, FOLATE, FERRITIN, TIBC, IRON, RETICCTPCT in the last 72 hours. Urinalysis    Component Value Date/Time   COLORURINE AMBER  (A) 08/02/2020 1415   APPEARANCEUR HAZY (A) 08/02/2020 1415   LABSPEC 1.044 (H) 08/02/2020 1415   PHURINE 5.0 08/02/2020 1415   GLUCOSEU NEGATIVE 08/02/2020 1415   HGBUR MODERATE (A) 08/02/2020 1415   BILIRUBINUR NEGATIVE 08/02/2020 1415   KETONESUR NEGATIVE 08/02/2020 1415   PROTEINUR 100 (A) 08/02/2020 1415   UROBILINOGEN 0.2 01/17/2015 2350   NITRITE POSITIVE (A) 08/02/2020 1415   LEUKOCYTESUR LARGE (A) 08/02/2020 1415   Sepsis Labs Invalid input(s): PROCALCITONIN,  WBC,  LACTICIDVEN Microbiology Recent Results (from the past 240 hour(s))  Resp Panel by RT-PCR (Flu A&B, Covid) Nasopharyngeal Swab     Status: None   Collection Time: 08/02/20 11:56 AM   Specimen: Nasopharyngeal Swab; Nasopharyngeal(NP) swabs in vial transport medium  Result Value Ref Range Status   SARS Coronavirus 2 by RT PCR NEGATIVE NEGATIVE Final    Comment: (NOTE) SARS-CoV-2 target nucleic acids are NOT DETECTED.  The SARS-CoV-2 RNA is generally detectable in upper respiratory specimens during the acute phase of infection. The lowest concentration of SARS-CoV-2 viral copies this assay can detect is 138 copies/mL. A negative result does not preclude SARS-Cov-2 infection and should not be used as the sole basis for treatment or other patient  management decisions. A negative result may occur with  improper specimen collection/handling, submission of specimen other than nasopharyngeal swab, presence of viral mutation(s) within the areas targeted by this assay, and inadequate number of viral copies(<138 copies/mL). A negative result must be combined with clinical observations, patient history, and epidemiological information. The expected result is Negative.  Fact Sheet for Patients:  EntrepreneurPulse.com.au  Fact Sheet for Healthcare Providers:  IncredibleEmployment.be  This test is no t yet approved or cleared by the [place] FDA and  has been authorized for  detection and/or diagnosis of SARS-CoV-2 by FDA under an Emergency Use Authorization (EUA). This EUA will remain  in effect (meaning this test can be used) for the duration of the COVID-19 declaration under Section 564(b)(1) of the Act, 21 U.S.C.section 360bbb-3(b)(1), unless the authorization is terminated  or revoked sooner.       Influenza A by PCR NEGATIVE NEGATIVE Final   Influenza B by PCR NEGATIVE NEGATIVE Final    Comment: (NOTE) The Xpert Xpress SARS-CoV-2/FLU/RSV plus assay is intended as an aid in the diagnosis of influenza from Nasopharyngeal swab specimens and should not be used as a sole basis for treatment. Nasal washings and aspirates are unacceptable for Xpert Xpress SARS-CoV-2/FLU/RSV testing.  Fact Sheet for Patients: EntrepreneurPulse.com.au  Fact Sheet for Healthcare Providers: IncredibleEmployment.be  This test is not yet approved or cleared by the [place] FDA and has been authorized for detection and/or diagnosis of SARS-CoV-2 by FDA under an Emergency Use Authorization (EUA). This EUA will remain in effect (meaning this test can be used) for the duration of the COVID-19 declaration under Section 564(b)(1) of the Act, 21 U.S.C. section 360bbb-3(b)(1), unless the authorization is terminated or revoked.  Performed at [facility], [place] [address] [place], [place] [address]   Urine culture     Status: Abnormal   Collection Time: 08/02/20  2:00 PM   Specimen: Urine, Random  Result Value Ref Range Status   Specimen Description URINE, RANDOM  Final   Special Requests   Final    NONE Performed at [facility], [place] [address], [place], [place] [address]    Culture >=100,000 COLONIES/mL ESCHERICHIA COLI (A)  Final   Report Status 08/04/2020 FINAL  Final   Organism ID, Bacteria ESCHERICHIA COLI (A)  Final      Susceptibility   Escherichia coli - MIC*    AMPICILLIN <=2 SENSITIVE Sensitive     CEFAZOLIN  <=4 SENSITIVE Sensitive     CEFEPIME <=0.12 SENSITIVE Sensitive     CEFTRIAXONE <=0.25 SENSITIVE Sensitive     CIPROFLOXACIN <=0.25 SENSITIVE Sensitive     GENTAMICIN <=1 SENSITIVE Sensitive     IMIPENEM <=0.25 SENSITIVE Sensitive     NITROFURANTOIN <=16 SENSITIVE Sensitive     TRIMETH/SULFA <=20 SENSITIVE Sensitive     AMPICILLIN/SULBACTAM <=2 SENSITIVE Sensitive     PIP/TAZO <=4 SENSITIVE Sensitive     * >=100,000 COLONIES/mL ESCHERICHIA COLI  Culture, blood (routine x 2)     Status: None   Collection Time: 08/02/20 11:20 PM   Specimen: BLOOD RIGHT HAND  Result Value Ref Range Status   Specimen Description BLOOD RIGHT HAND  Final   Special Requests   Final    BOTTLES DRAWN AEROBIC AND ANAEROBIC Blood Culture adequate volume   Culture   Final    NO GROWTH 5 DAYS Performed at [facility], [address] [place],  [address]    Report Status 08/08/2020 FINAL  Final  Culture, blood (routine x 2)     Status: None   Collection Time: 08/02/20 11:35 PM   Specimen: BLOOD  Result Value Ref Range Status   Specimen Description BLOOD RT THUMB  Final   Special Requests AEROBIC BOTTLE ONLY Blood Culture adequate volume  Final   Culture   Final    NO GROWTH 5 DAYS Performed at [facility], [address], [place], [place] [address]    Report Status 08/08/2020 FINAL  Final  Culture, blood (routine x 2)     Status: None (Preliminary result)   Collection Time: 08/07/20  1:02 PM   Specimen: BLOOD LEFT HAND  Result Value Ref Range Status   Specimen Description BLOOD LEFT HAND  Final   Special Requests   Final    BOTTLES DRAWN AEROBIC AND ANAEROBIC Blood Culture results may not be optimal due to an inadequate volume of blood received in culture bottles   Culture   Final    NO GROWTH 4 DAYS Performed at [facility], [place] [address], [place], [place] [address]    Report Status PENDING  Incomplete  Culture, blood (routine x 2)     Status: None (Preliminary result)    Collection Time: 08/07/20  1:03 PM   Specimen: BLOOD LEFT HAND  Result Value Ref Range Status   Specimen Description BLOOD LEFT HAND  Final   Special Requests   Final    BOTTLES DRAWN AEROBIC AND ANAEROBIC Blood Culture results may not be optimal due to an inadequate volume of blood received in culture bottles   Culture   Final    NO GROWTH 4 DAYS Performed at [facility], [place] [address] [place],  [address]    Report Status PENDING  Incomplete  Resp Panel by RT-PCR (Flu A&B, Covid) Nasopharyngeal Swab     Status: None   Collection Time: 08/07/20  3:49 PM   Specimen: Nasopharyngeal Swab; Nasopharyngeal(NP) swabs in vial transport medium  Result Value Ref Range Status   SARS Coronavirus 2 by RT PCR NEGATIVE NEGATIVE Final    Comment: (NOTE) SARS-CoV-2 target nucleic acids are NOT DETECTED.  The SARS-CoV-2 RNA is generally detectable in upper respiratory specimens during the acute phase of infection. The lowest concentration of SARS-CoV-2 viral copies this assay can detect is 138 copies/mL. A negative result does not preclude SARS-Cov-2 infection and should not be used as the sole basis for treatment or other patient management decisions. A negative result may occur with  improper specimen collection/handling, submission of specimen other than nasopharyngeal swab, presence of viral mutation(s) within the areas targeted by this assay, and inadequate number of viral copies(<138 copies/mL). A negative result must be combined with clinical observations, patient history, and epidemiological information. The expected result is Negative.  Fact Sheet for Patients:  EntrepreneurPulse.com.au  Fact Sheet for Healthcare Providers:  IncredibleEmployment.be  This test is no t yet approved or cleared by the [place] FDA and  has been authorized for detection and/or diagnosis of SARS-CoV-2 by FDA under an Emergency Use Authorization (EUA).  This EUA will remain  in effect (meaning this test can be used) for the duration of the COVID-19 declaration under Section 564(b)(1) of the Act, 21 U.S.C.section 360bbb-3(b)(1), unless the authorization is terminated  or revoked sooner.       Influenza A by PCR NEGATIVE NEGATIVE Final   Influenza B by PCR NEGATIVE NEGATIVE Final    Comment: (NOTE) The Xpert Xpress SARS-CoV-2/FLU/RSV  plus assay is intended as an aid in the diagnosis of influenza from Nasopharyngeal swab specimens and should not be used as a sole basis for treatment. Nasal washings and aspirates are unacceptable for Xpert Xpress SARS-CoV-2/FLU/RSV testing.  Fact Sheet for Patients: EntrepreneurPulse.com.au  Fact Sheet for Healthcare Providers: IncredibleEmployment.be  This test is not yet approved or cleared by the [place] FDA and has been authorized for detection and/or diagnosis of SARS-CoV-2 by FDA under an Emergency Use Authorization (EUA). This EUA will remain in effect (meaning this test can be used) for the duration of the COVID-19 declaration under Section 564(b)(1) of the Act, 21 U.S.C. section 360bbb-3(b)(1), unless the authorization is terminated or revoked.  Performed at [facility], [place] [address], [place], [place] [address]      Time coordinating discharge:  40 minutes  SIGNED:   Barb Merino, MD  [facility] 08/12/2020, 11:02 AM

## 2020-08-13 ENCOUNTER — Other Ambulatory Visit: Payer: Self-pay

## 2020-08-25 IMAGING — CT CT HEAD W/O CM
4 series · 16 of 47 positions shown, 18 images · non-contrast
Comparison: 05/29/2018

CLINICAL DATA: Multiple seizures this week. Now with tremors and
falling.

EXAM:
CT HEAD WITHOUT CONTRAST
TECHNIQUE: Contiguous axial images were obtained from the base of the skull
through the vertex without intravenous contrast.

[Series 3: head without · axial · non-contrast · 0.42mm/px · z∈[-88,+32]mm · 7 of 33 slices shown, 9 images]
[im 5/33  brain]
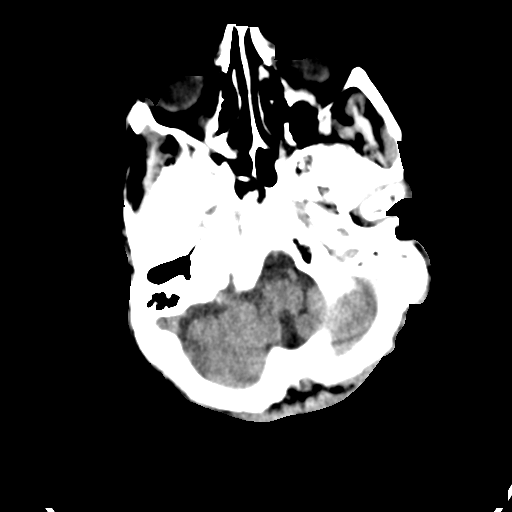
[im 5/33  bone]
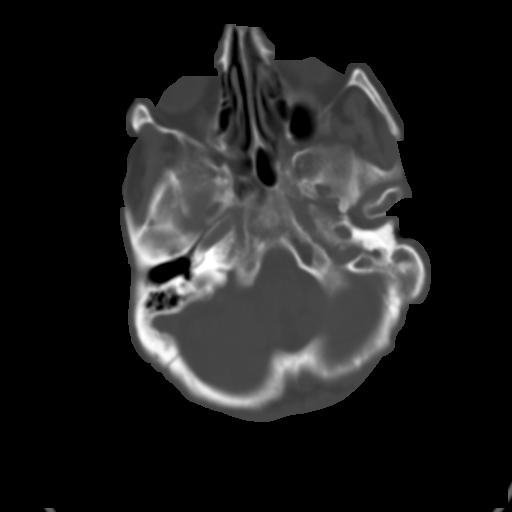
[im 9/33  brain]
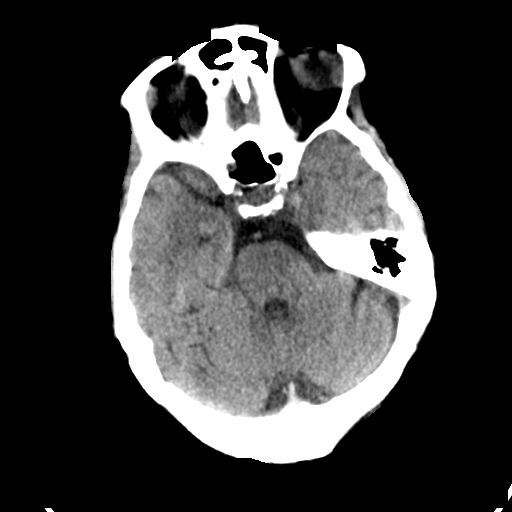
[im 13/33  brain]
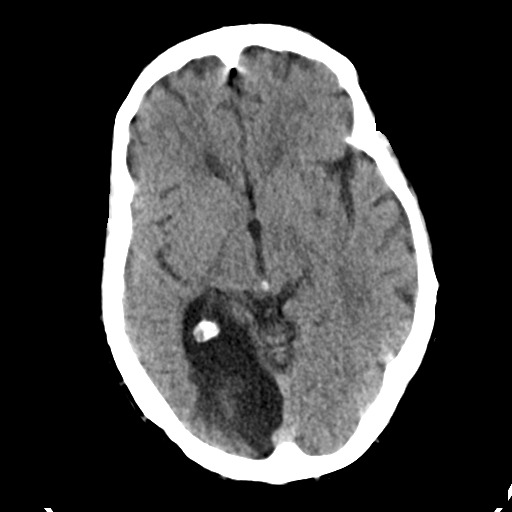
[im 17/33  brain]
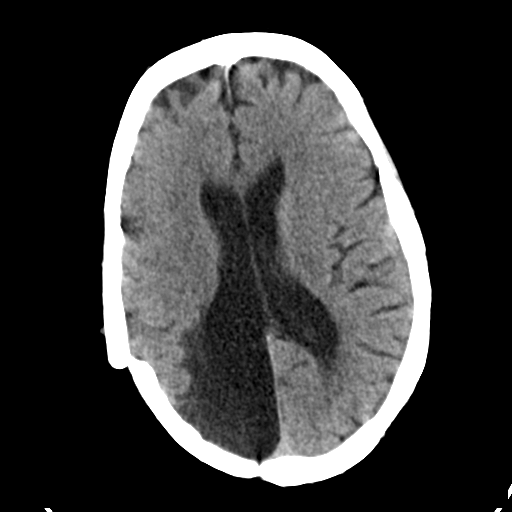
[im 21/33  brain]
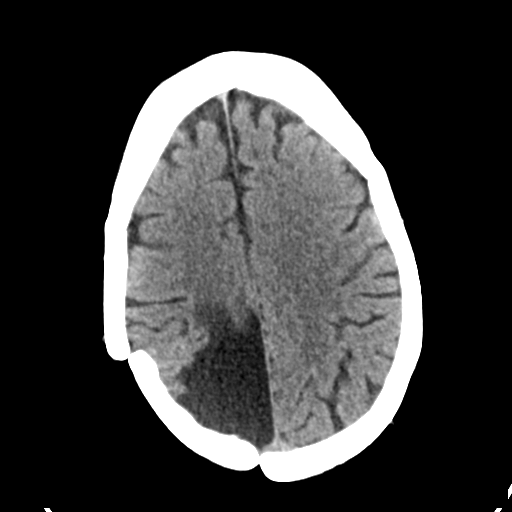
[im 21/33  bone]
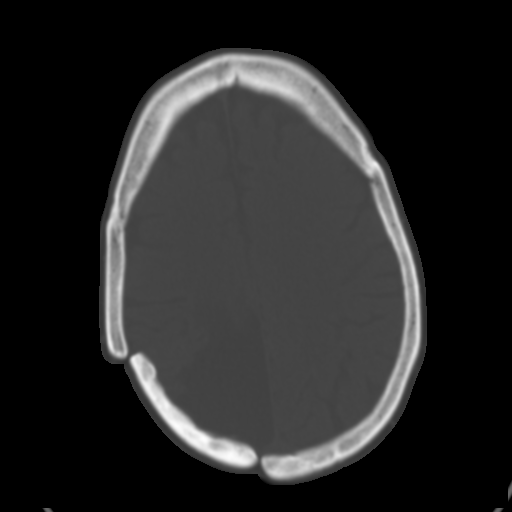
[im 25/33  brain]
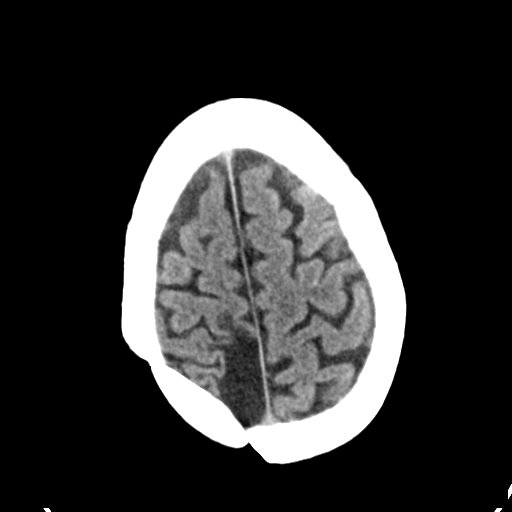
[im 29/33  brain]
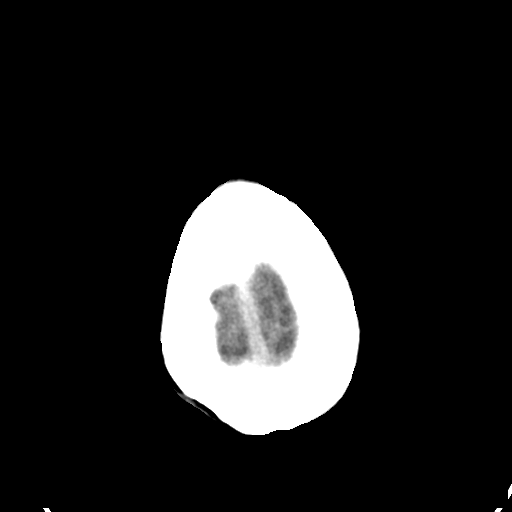

[Series 4: head bone · axial · 0.42mm/px · z∈[-92,-60]mm · 3 of 82 slices shown]
[im 9/82  bone]
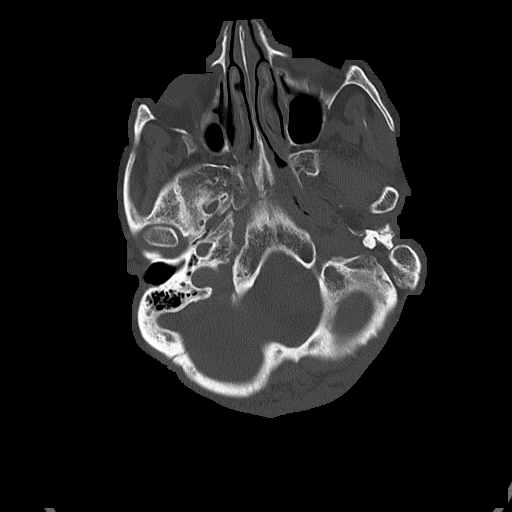
[im 17/82  bone]
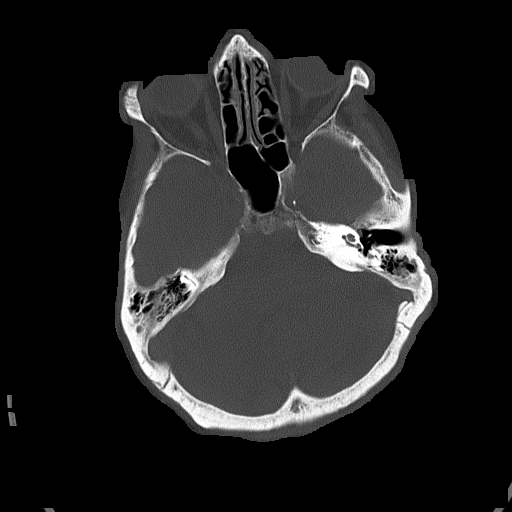
[im 25/82  bone]
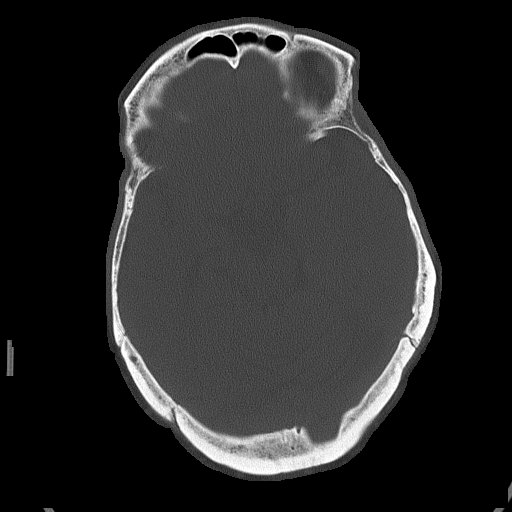

[Series 5: head without cor · coronal · non-contrast · 0.33mm/px · 3 of 67 slices shown]
[im 23/67  brain]
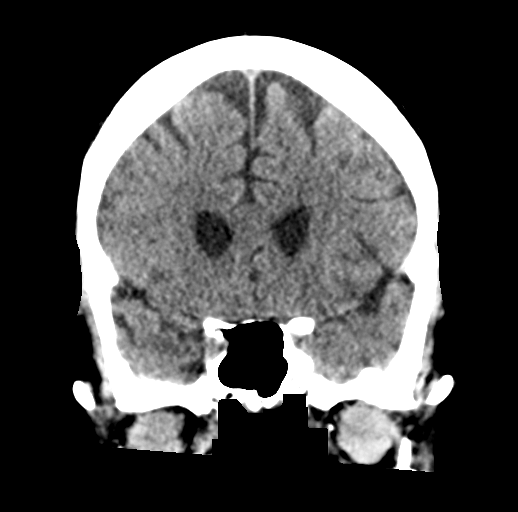
[im 30/67  brain]
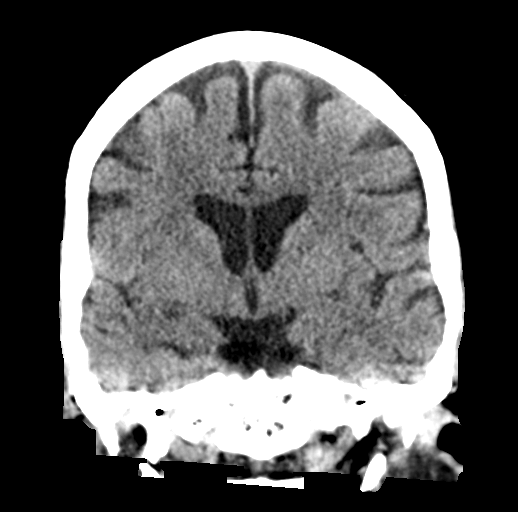
[im 37/67  brain]
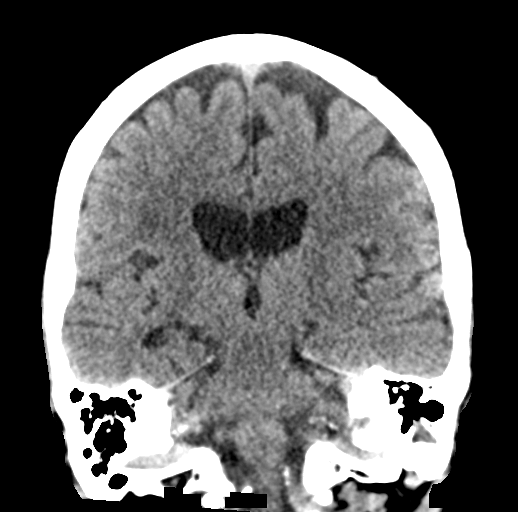

[Series 6: head without sag · sagittal · non-contrast · 0.33mm/px · 3 of 56 slices shown]
[im 21/56  brain]
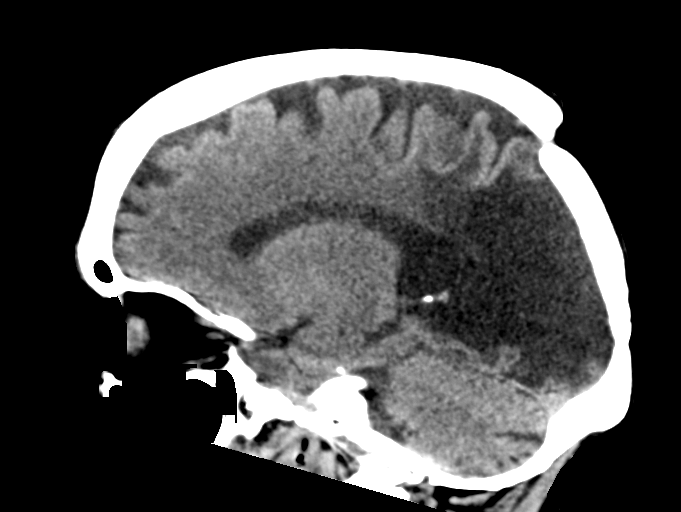
[im 28/56  brain]
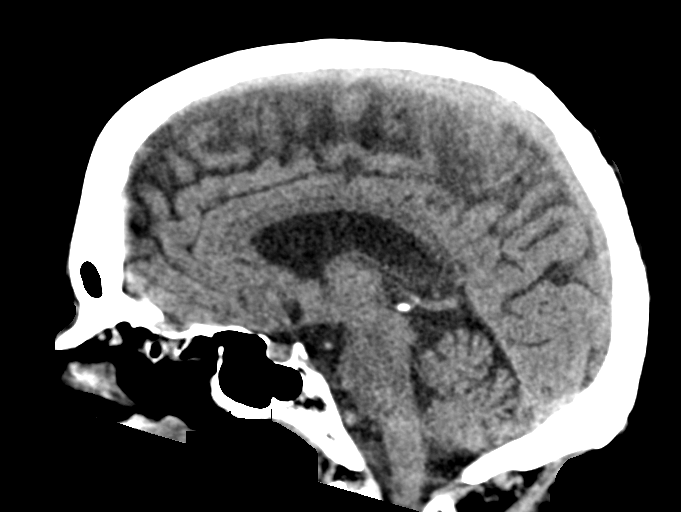
[im 36/56  brain]
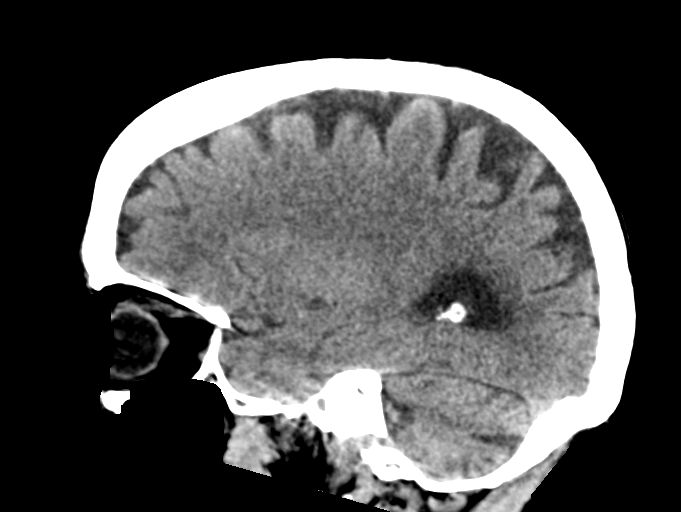

[16 of 47 positions shown; findings below may reference images not displayed]

FINDINGS: Brain: Examination demonstrates stable encephalomalacia over the
right occipital/posterior temporal region. Remaining ventricles,
cisterns and CSF spaces are otherwise unchanged and within normal.
There is no mass, mass effect, shift of midline structures or acute
hemorrhage. No evidence of acute infarction.

Vascular: No hyperdense vessel or unexpected calcification.

Skull: Previous right posterior parietal/occipital craniotomy.

Sinuses/Orbits: No acute finding.

Other: None.
IMPRESSION: No acute findings.

Previous right posterior parietal/occipital craniotomy. Underlying
encephalomalacia right posterior temporal/occipital region
unchanged.

## 2020-08-27 IMAGING — MR MR LUMBAR SPINE W/O CM
11 of 16 series · 29 of 48 positions shown · non-contrast
Comparison: Head CT without contrast 07/16/2018.

CLINICAL DATA: 62-year-old female with history of seizures.
Unexplained left-side and lower extremity heaviness. Pain radiating
from the right neck to the flank.

EXAM:
MRI TOTAL SPINE WITHOUT CONTRAST
TECHNIQUE: Multisequence MR imaging of the spine from the cervical spine to the
sacrum was performed without IV contrast administration.

[Series 5: T2 · sagittal · 3.0mm · 0.69mm/px · 3 of 15 slices shown (1 of 6)]
[im 1/15]
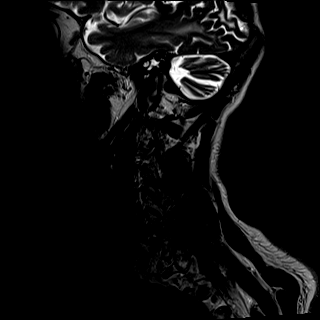
[im 8/15]
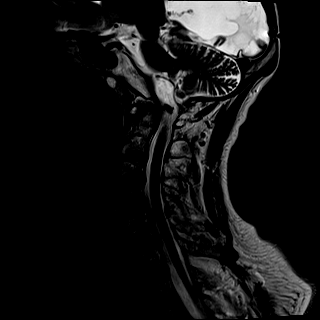
[im 15/15]
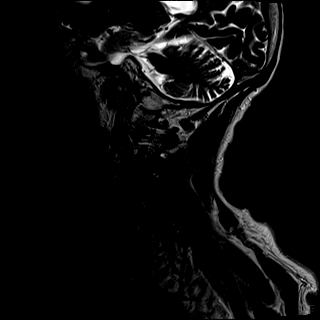

[Series 7: T1 · sagittal · 3.0mm · 0.69mm/px · 2 of 15 slices shown (1 of 5)]
[im 1/15]
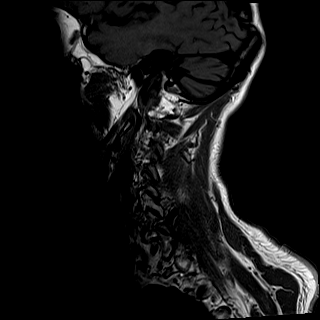
[im 15/15]
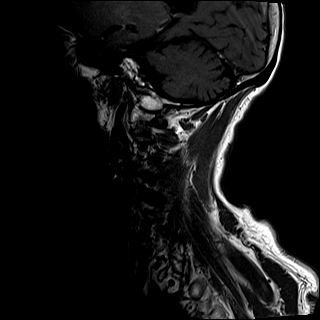

[Series 8: T2 · axial · 3.0mm · 0.66mm/px · z∈[-88,+37]mm · 5 of 40 slices shown (2 of 6)]
[im 1/40]
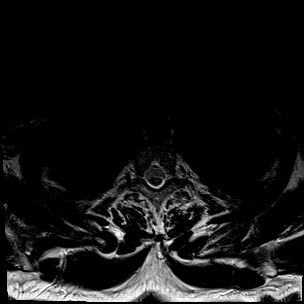
[im 10/40]
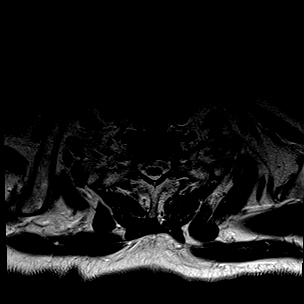
[im 20/40]
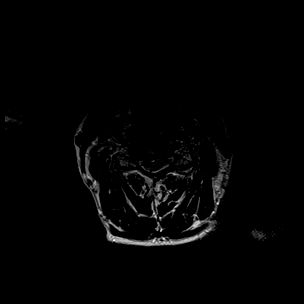
[im 30/40]
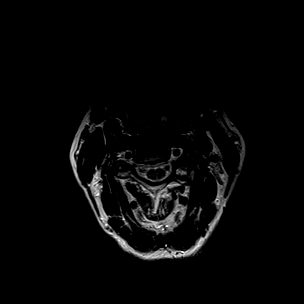
[im 40/40]
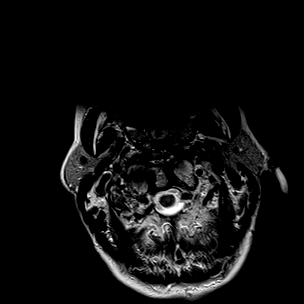

[Series 23: T1 · sagittal · 6.0mm · 1.23mm/px · 1 of 9 slices shown (2 of 5)]
[im 1/9]
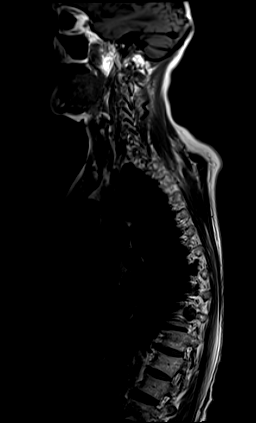

[Series 24: T2 · sagittal · 3.0mm · 0.76mm/px · 2 of 19 slices shown (3 of 6)]
[im 1/19]
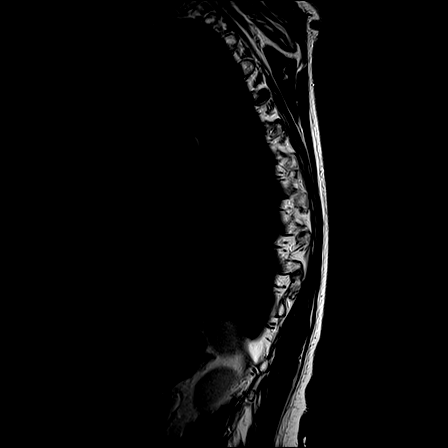
[im 19/19]
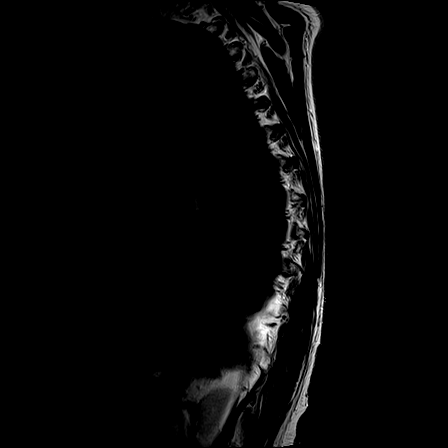

[Series 25: T1 · sagittal · 3.0mm · 0.76mm/px · 2 of 19 slices shown (3 of 5)]
[im 1/19]
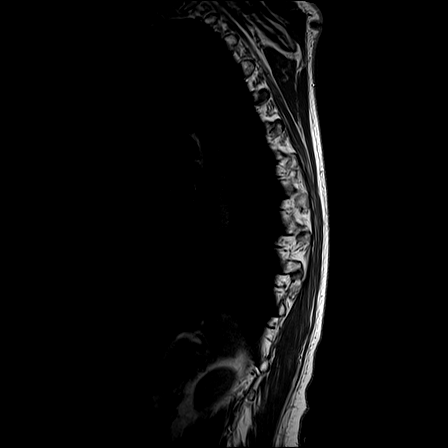
[im 19/19]
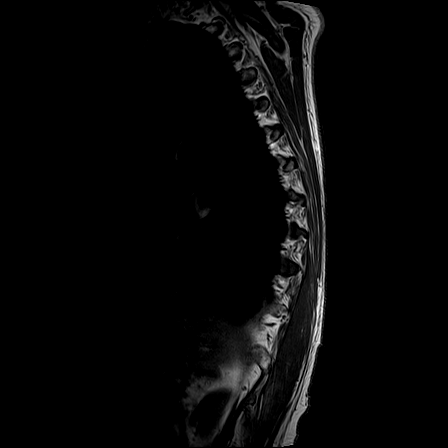

[Series 27: T2 · axial · 4.0mm · 0.59mm/px · z∈[-280,-82]mm · 5 of 36 slices shown (4 of 6)]
[im 1/36]
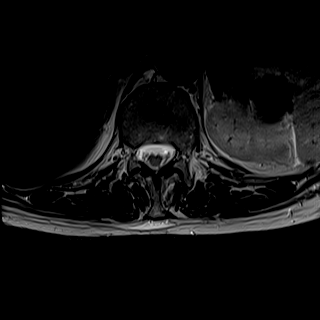
[im 9/36]
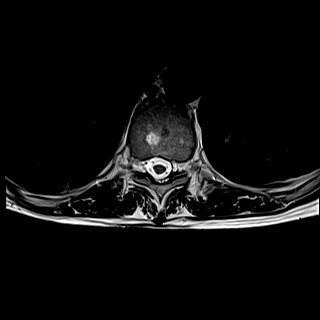
[im 18/36]
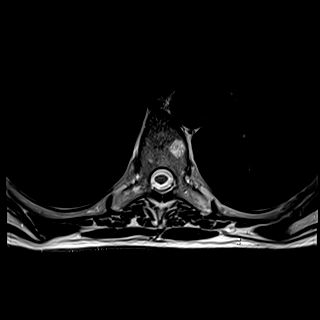
[im 27/36]
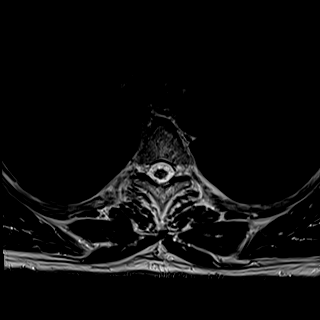
[im 36/36]
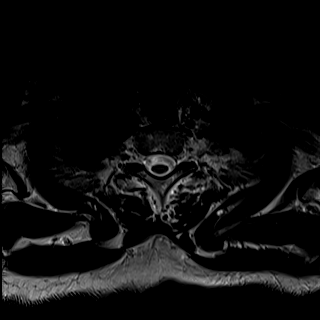

[Series 33: T2 · sagittal · 4.0mm · 0.73mm/px · 2 of 16 slices shown (5 of 6)]
[im 1/16]
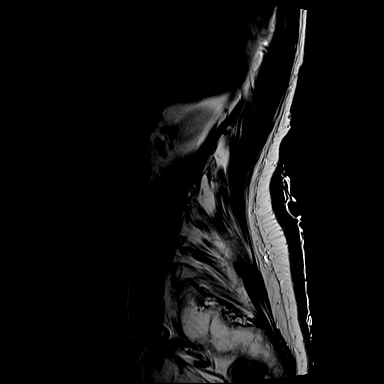
[im 16/16]
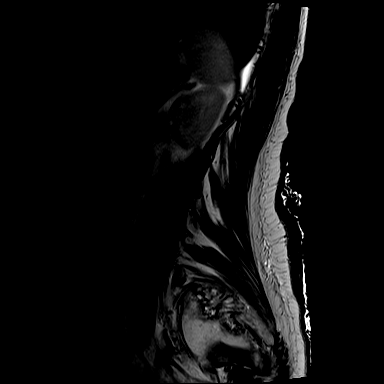

[Series 35: T1 · sagittal · 4.0mm · 0.88mm/px · 2 of 16 slices shown (4 of 5)]
[im 1/16]
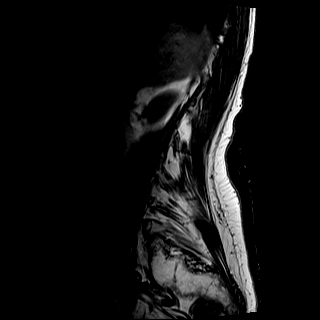
[im 16/16]
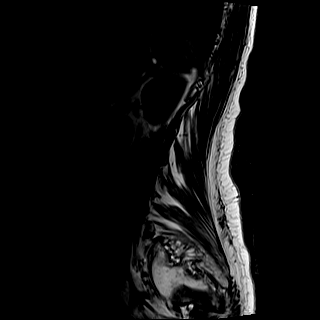

[Series 36: T2 · axial · 4.0mm · 0.57mm/px · z∈[-497,-299]mm · 4 of 32 slices shown (6 of 6)]
[im 1/32]
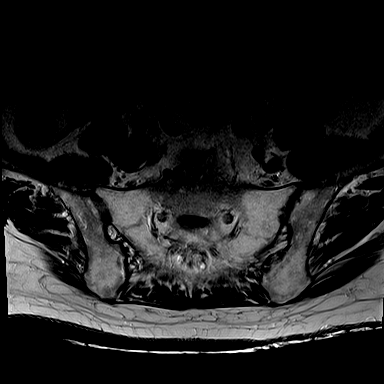
[im 11/32]
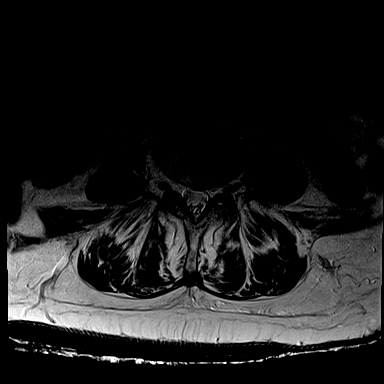
[im 21/32]
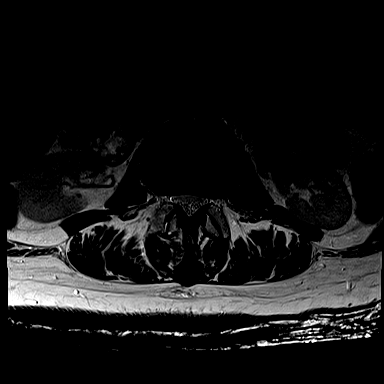
[im 32/32]
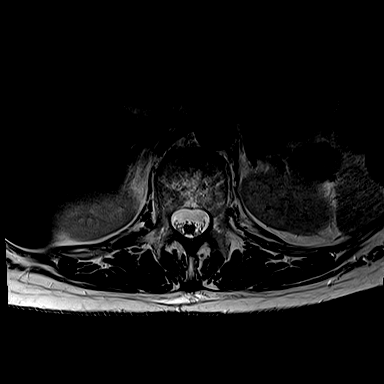

[Series 37: T1 · axial · 4.0mm · 0.34mm/px · 1 of 32 slices shown (5 of 5)]
[im 1/32]
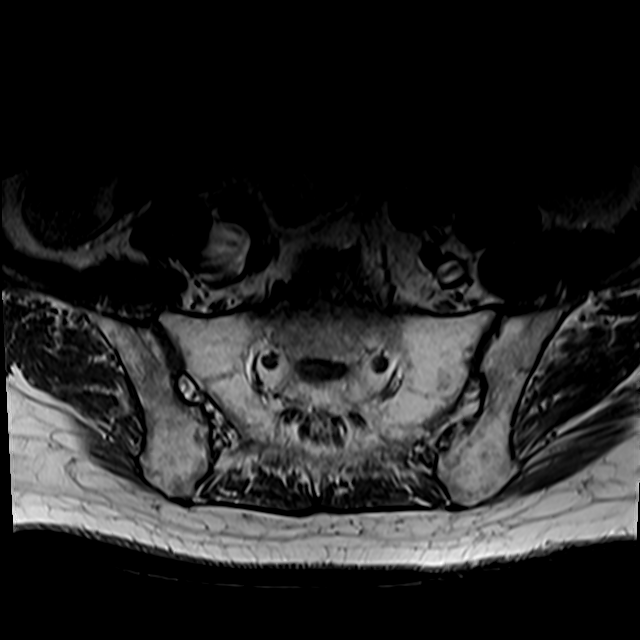

[29 of 48 positions shown; findings below may reference images not displayed]

Brain MRI
05/31/2018. Lumbar MRI 03/20/2018. Chest CTA 05/23/2018. Cervical
spine CT 05/29/2018.
FINDINGS: MRI CERVICAL SPINE FINDINGS

Alignment: Stable cervical lordosis compared to the [place] CT.

Vertebrae: No marrow edema or evidence of acute osseous abnormality.
Degenerative endplate marrow signal changes at C5-C6 and C6-C7.

Cord: Spinal cord signal is within normal limits at all visualized
levels.

Posterior Fossa, vertebral arteries, paraspinal tissues:
Cervicomedullary junction is within normal limits. Large area of
posterior right hemisphere encephalomalacia redemonstrated.

Preserved major vascular flow voids in the neck. Negative neck soft
tissues and visible lung apices.

Disc levels:
C2-C3:  Mild disc bulge.  Mild facet hypertrophy.  No stenosis.
C3-C4: Mild disc bulge. Mild to moderate facet hypertrophy greater
on the left. Mild to moderate ligament flavum hypertrophy. No spinal
stenosis. Mild left C4 foraminal stenosis.
C4-C5: Subtle anterolisthesis with moderate to severe left and
mild-to-moderate right facet hypertrophy. Mild to moderate ligament
flavum hypertrophy. Mild disc bulge and endplate spurring primarily
affecting the left foramen. No significant spinal stenosis. Moderate
to severe left and mild right C5 foraminal stenosis.
C5-C6: Disc space loss with right eccentric circumferential disc
osteophyte complex. Mild facet and ligament flavum hypertrophy.
Spinal stenosis. No definite spinal cord mass effect. Mild to
moderate left and severe right C6 foraminal stenosis.
C6-C7: Disc space loss with circumferential disc osteophyte complex.
Mild facet and ligament flavum hypertrophy. Mild spinal stenosis, no
cord mass effect. Moderate bilateral C7 foraminal stenosis.
C7-T1: Small right paracentral disc protrusion. Mild facet and
ligament flavum hypertrophy. No stenosis.

MRI THORACIC SPINE FINDINGS

Segmentation:  Normal on the comparison CT.

Alignment: Stable mildly exaggerated thoracic kyphosis since
[REDACTED]. No spondylolisthesis.

Vertebrae: Mild chronic endplate changes in the lower thoracic
spine, most pronounced at T12. No vertebral marrow edema. Benign T7
vertebral body hemangioma.

However, there is confluent marrow edema in the posterior right 8th,
9th, and 10th ribs (series 25 image 4, series 26 image 3, series 28
images 21 through 27). No rib fracture here was evident in [REDACTED].
Marrow signal in the other posterior ribs appears normal.

Cord: No convincing thoracic spinal cord signal spinal canal
abnormality. Capacious spinal canal. The conus medullaris appears
normal at T12-L1.

Paraspinal and other soft tissues: Small or trace layering pleural
effusions. Otherwise negative visible thoracic and upper abdominal
viscera

Disc levels:

No thoracic spinal stenosis.

Small right paracentral disc protrusion or endplate osteophyte at
T8-T9.

Mild thoracic posterior element hypertrophy, no thoracic neural
foraminal stenosis.

MRI LUMBAR SPINE FINDINGS

Segmentation: Normal, concordant with the other spinal numbering and
the same numbering system used on the [facility] MRI.

Alignment: Stable lumbar lordosis since [REDACTED]. Subtle
retrolisthesis of L3 on L4 and L4 on L5 is stable.

Vertebrae: No marrow edema or evidence of acute osseous abnormality.
Stable marrow signal. Intact visible sacrum and SI joints.

Conus medullaris: Extends to the L1 level. No lower spinal cord or
conus signal abnormality. Cauda equina nerve roots appear stable and
normal.

Paraspinal and other soft tissues: Stable and negative visible
abdominal viscera. Negative visualized posterior paraspinal soft
tissues.

Disc levels:

Unchanged since [REDACTED].

Borderline to mild multifactorial spinal stenosis at L2-L3 and L3-L4
related to circumferential disc bulge and posterior element
hypertrophy.
IMPRESSION: 1. Confluent marrow edema in the posterior right 8th, 9th, and 10th
ribs. The contiguous pattern of involvement suggests sequelae of
trauma. No rib fractures were evident on the 05/23/2018 Chest CTA.
Perhaps these are posttraumatic bone contusions.
There are trace layering pleural effusions.
2. No other acute osseous abnormality identified in the cervical,
thoracic, or lumbar spine.
3. No convincing cervical or thoracic spinal cord abnormality.
4. Mild degenerative cervical spinal stenosis at C5-C6 and C6-C7
with moderate or severe bilateral foraminal stenosis.
No thoracic spinal or foraminal stenosis.
Stable borderline to mild lumbar spinal stenosis since the lumbar
MRI on 03/20/2018.

## 2020-08-28 ENCOUNTER — Other Ambulatory Visit: Payer: Self-pay | Admitting: Neurology

## 2020-09-04 DIAGNOSIS — Z043 Encounter for examination and observation following other accident: Secondary | ICD-10-CM | POA: Diagnosis not present

## 2020-09-04 DIAGNOSIS — M1612 Unilateral primary osteoarthritis, left hip: Secondary | ICD-10-CM | POA: Diagnosis not present

## 2020-09-04 DIAGNOSIS — M47812 Spondylosis without myelopathy or radiculopathy, cervical region: Secondary | ICD-10-CM | POA: Diagnosis not present

## 2020-09-11 ENCOUNTER — Ambulatory Visit: Payer: Medicare Other | Admitting: Physician Assistant

## 2020-09-25 NOTE — Progress Notes (Unsigned)
Cardiology Office Note:    Date:  09/26/2020   ID:  [name], DOB 1956/03/20, MRN [id number]  PCP:  [name], MD  Cardiologist:  Pixie Casino, MD   Referring MD: [name], MD   Chief Complaint  Patient presents with  . Follow-up  PAF  History of Present Illness:    [name] is a 65 y.o. 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  She has a family history of CAD with mom having 4 MI's in her 45s and sudden cardiac death in her father at age 24. She was seen in July 2018 by Dr. Debara Pickett for chest pain, felt to be noncardiac, but she did not come to follow up appt. She had COVID-19 PNA Sept 2021.   She was admitted 08/02/20-08/12/20 after a fall associated with left hand pain, neck pain and occipital headache. She was found to have new onset Afib RVR in the ER in the setting of UTI and pyelonephritis. She suffered a C2 fracture and left hand and finger fracture. Fall was not clearly associated with LOC, suspected mechanical fall. Afib RVR treated with cardizem push and gtt. HS troponin mild and flat consistent with demand ischemia. Echo showed EF 55-60%, normal RV function, no significant valvular disease, elevated right atrial pressure.  TSH WNL, Mg was 1.6.  Due to soft BP, IV amiodarone was initiated. Unfortunately, IV infiltrated. Chest pain under C-collar tender to touch, suspect MSK. She converted to sinus rhythm and amiodarone was stopped. Lopressor held due to hypotension. Digoxin was initiated, but this was discontinued. She has frequent seizures with history of right occipital AVM and intracranial hemorrhage, therefore, she was deemed a poor candidate for anticoagulation. She remained in sinus rhythm and was not discharged on amiodarone, BB, CCB, or anticoagulation.   She presents today for follow up.  She had another fall 2 weeks after discharge. She slipped on soap in the shower, she hit her head. [facility] nurse came out and evaluated her. She does complain of left leg swelling, pain, and bruising. She took 162 mg ASA 2 nights ago because she thought she had a blood clot. Right leg swelling and pain with bruising x 3 weeks ago. No palpitations, chest pain, dyspnea, or orthopnea. She went to the beach this past weekend and had a great time. She is in NSR today.    Past Medical History:  Diagnosis Date  . Asthma   . Cerebral hemorrhage ([place]) 1989  . Chronic back pain   . COPD (chronic obstructive pulmonary disease) ([place])   . DDD (degenerative disc disease) 06/12/2013  . DVT (deep venous thrombosis) ([place])    "she's had several since 1990"  . GERD (gastroesophageal reflux disease)   . Headache    "usually around time when she's had a seizure"  . History of blood transfusion    "when she was a baby"  . History of stomach ulcers   . Kidney stones   . Neuropathy   . Seizures ([place])    "because of her brain surgery"  . Stroke [facility]) [phone]   family denies residual on 11/09/2014  . Tunnel vision    "since brain OR"    Past Surgical History:  Procedure Laterality Date  . [place]   craniotomy with hematoma evacuation  . CESAREAN SECTION  1979; 1987; 1989  .  HEMORRHOID SURGERY    . TUBAL LIGATION  1990's    Current Medications: Current Meds  Medication Sig  . acetaminophen (TYLENOL) 500 MG tablet Take 1,000 mg by mouth every 6 (six) hours as needed for mild pain.  Marland Kitchen albuterol (VENTOLIN HFA) 108 (90 Base) MCG/ACT inhaler Inhale 1 puff into the lungs every 4 (four) hours as needed for wheezing or shortness of breath.   . cholecalciferol (VITAMIN D3) 25 MCG (1000 UNIT) tablet Take 1 tablet (1,000 Units total) by mouth daily.  . [employer] B-12 500 MCG tablet TAKE 2 TABLETS BY MOUTH EVERY DAY (Patient taking differently: Take 1,000 mcg by mouth daily.)  . escitalopram (LEXAPRO) 10 MG tablet  Take 1 tablet (10 mg total) by mouth daily.  Marland Kitchen gabapentin (NEURONTIN) 300 MG capsule Take 1 capsule every night (Patient taking differently: Take 300 mg by mouth at bedtime.)  . lacosamide (VIMPAT) 200 MG TABS tablet Take 1 tablet (200 mg total) by mouth 2 (two) times daily.  Marland Kitchen LORazepam (ATIVAN) 1 MG tablet Take 1 tablet as needed for seizure. Do not take more than 2 in 24 hours. (Patient taking differently: Take 1 mg by mouth See admin instructions. Take 1 tablet as needed for seizure. Do not take more than 2 in 24 hours.)  . phenytoin (DILANTIN) 100 MG ER capsule 100mg  in AM, 200 mg in PM (Patient taking differently: 100mg  in AM, 130 mg in PM)  . XCOPRI 100 MG TABS TAKE 1 TABLET BY MOUTH  DAILY     Allergies:   Zonisamide, Chocolate flavor, Codeine, Keppra [levetiracetam], Pregabalin, and Olive oil   Social History   Socioeconomic History  . Marital status: Divorced    Spouse name: Not on file  . Number of children: 3  . Years of education: Not on file  . Highest education level: Not on file  Occupational History  . Not on file  Tobacco Use  . Smoking status: Former Smoker    Packs/day: 0.50    Years: 20.00    Pack years: 10.00    Types: Cigarettes    Quit date: 10/26/2017    Years since quitting: 2.9  . Smokeless tobacco: Never Used  . Tobacco comment: Previously smoked 2 ppd, down to 4 cigs/day  Vaping Use  . Vaping Use: Never used  Substance and Sexual Activity  . Alcohol use: No  . Drug use: No  . Sexual activity: Never    Birth control/protection: Post-menopausal  Other Topics Concern  . Not on file  Social History Narrative   Pt lives in 2 story home with her daughter, daughter's family and pt's mother-in-law   Has 2 living children / 1 deceased   November 13, 2022 grade education   Worked for [employer] until 1998 when she became disabled.    Right handed   Social Determinants of Health   Financial Resource Strain: Not on file  Food Insecurity: Not on file  Transportation  Needs: Not on file  Physical Activity: Not on file  Stress: Not on file  Social Connections: Not on file     Family History: The patient's family history includes Cancer in her brother and mother; Heart attack in her father and mother.  ROS:   Please see the history of present illness.     All other systems reviewed and are negative.  EKGs/Labs/Other Studies Reviewed:    The following studies were reviewed today:  Echo 08/03/20: 1. Left ventricular ejection fraction, by estimation, is 55 to 60%. The  left ventricle has normal function. The left ventricle has no regional  wall motion abnormalities. Left ventricular diastolic parameters are  indeterminate.  2. Right ventricular systolic function is normal. The right ventricular  size is normal. There is mildly elevated pulmonary artery systolic  pressure. The estimated right ventricular systolic pressure is 32.9 mmHg.  3. The mitral valve is normal in structure. No evidence of mitral valve  regurgitation. No evidence of mitral stenosis.  4. The aortic valve is tricuspid. Aortic valve regurgitation is not  visualized. No aortic stenosis is present.  5. The inferior vena cava is dilated in size with <50% respiratory  variability, suggesting right atrial pressure of 15 mmHg.    EKG:  EKG is ordered today.  The ekg ordered today demonstrates sinus rhythm HR 83  Recent Labs: 08/02/2020: TSH 1.332 08/08/2020: ALT 17 08/09/2020: BUN 7; Creatinine, Ser 0.75; Magnesium 2.3; Potassium 4.6; Sodium 140 08/12/2020: Hemoglobin 10.4; Platelets 319  Recent Lipid Panel    Component Value Date/Time   CHOL 161 05/24/2018 0556   TRIG 80 04/02/2020 1730   HDL 74 05/24/2018 0556   CHOLHDL 2.2 05/24/2018 0556   VLDL 6 05/24/2018 0556   LDLCALC 81 05/24/2018 0556    Physical Exam:    VS:  BP 110/70   Pulse 83   Ht 5\' 7"  (1.702 m)   Wt 139 lb 3.2 oz (63.1 kg)   SpO2 95%   BMI 21.80 kg/m     Wt Readings from Last 3 Encounters:   09/26/20 139 lb 3.2 oz (63.1 kg)  08/02/20 138 lb 14.2 oz (63 kg)  06/06/20 138 lb (62.6 kg)     GEN:  Well nourished, well developed in no acute distress HEENT: Normal NECK: No JVD; No carotid bruits LYMPHATICS: No lymphadenopathy CARDIAC: RRR, no murmurs, rubs, gallops RESPIRATORY:  Clear to auscultation without rales, wheezing or rhonchi  ABDOMEN: Soft, non-tender, non-distended MUSCULOSKELETAL:  Right lower leg swelling and bruising, dry skin  SKIN: Warm and dry NEUROLOGIC:  Alert and oriented x 3 PSYCHIATRIC:  Normal affect   ASSESSMENT:    1. Atrial flutter, unspecified type ([place])   2. Localized swelling of left lower extremity   3. Family history of premature coronary heart disease    PLAN:    In order of problems listed above:  Atrial fibrillation with RVR - in the setting of mechanical fall and UTI/pyelonephritis - maintaining  - This patients CHA2DS2-VASc Score and unadjusted Ischemic Stroke Rate (% per year) is equal to 0.6 % stroke rate/year from a score of 1 (female) Given her history of seizures and intracranial hemorrhage, she is not anticoagulated.    Unilateral lower extremity swelling and pain - history of DVT - she is unable to take anticoagulation long-term due to frequent falls - may be a candidate for IVC filter - will rule out DVT   Family history of premature heart disease - will need to discuss risk factor management - A1c 5.0% 07/2020 - needslipid panel  (not fasting today)   Follow up in 3 months.    Medication Adjustments/Labs and Tests Ordered: Current medicines are reviewed at length with the patient today.  Concerns regarding medicines are outlined above.  No orders of the defined types were placed in this encounter.  No orders of the defined types were placed in this encounter.   Signed, Ledora Bottcher, [place]  09/26/2020 9:13 AM    [facility]

## 2020-09-26 ENCOUNTER — Encounter: Payer: Self-pay | Admitting: Physician Assistant

## 2020-09-26 ENCOUNTER — Ambulatory Visit: Payer: Medicare Other | Admitting: Physician Assistant

## 2020-09-26 ENCOUNTER — Other Ambulatory Visit: Payer: Self-pay

## 2020-09-26 VITALS — BP 110/70 | HR 83 | Ht 67.0 in | Wt 139.2 lb

## 2020-09-26 DIAGNOSIS — I4892 Unspecified atrial flutter: Secondary | ICD-10-CM | POA: Diagnosis not present

## 2020-09-26 DIAGNOSIS — R2242 Localized swelling, mass and lump, left lower limb: Secondary | ICD-10-CM

## 2020-09-26 DIAGNOSIS — Z8249 Family history of ischemic heart disease and other diseases of the circulatory system: Secondary | ICD-10-CM | POA: Diagnosis not present

## 2020-09-26 NOTE — Patient Instructions (Signed)
Medication Instructions:  No Changes *If you need a refill on your cardiac medications before your next appointment, please call your pharmacy*   Lab Work: Lipid Panel, Hepatic Panel If you have labs (blood work) drawn today and your tests are completely normal, you will receive your results only by: Marland Kitchen MyChart Message (if you have MyChart) OR . A paper copy in the mail If you have any lab test that is abnormal or we need to change your treatment, we will call you to review the results.   Testing/Procedures: [place], [place] has requested that you have a lower or upper extremity venous duplex. This test is an ultrasound of the veins in the legs or arms. It looks at venous blood flow that carries blood from the heart to the legs or arms. Allow one hour for a Lower Venous exam. Allow thirty minutes for an Upper Venous exam. There are no restrictions or special instructions.       Follow-Up: At [facility], you and your health needs are our priority.  As part of our continuing mission to provide you with exceptional heart care, we have created designated Provider Care Teams.  These Care Teams include your primary Cardiologist (physician) and Advanced Practice Providers (APPs -  Physician Assistants and Nurse Practitioners) who all work together to provide you with the care you need, when you need it.   Your next appointment:   2 month(s)  The format for your next appointment:   In Person  Provider:   K. Mali Hilty, MD

## 2020-10-08 ENCOUNTER — Other Ambulatory Visit: Payer: Self-pay | Admitting: Neurology

## 2020-10-10 ENCOUNTER — Encounter: Payer: Self-pay | Admitting: Cardiology

## 2020-10-10 ENCOUNTER — Ambulatory Visit (HOSPITAL_BASED_OUTPATIENT_CLINIC_OR_DEPARTMENT_OTHER)
Admission: RE | Admit: 2020-10-10 | Discharge: 2020-10-10 | Disposition: A | Discharge status: Home / Self Care | Payer: Medicare Other | Source: Ambulatory Visit | Attending: Physician Assistant | Admitting: Physician Assistant

## 2020-10-10 ENCOUNTER — Telehealth: Payer: Self-pay | Admitting: Neurology

## 2020-10-10 ENCOUNTER — Encounter (HOSPITAL_COMMUNITY): Payer: Self-pay | Admitting: Radiology

## 2020-10-10 ENCOUNTER — Other Ambulatory Visit: Payer: Self-pay

## 2020-10-10 ENCOUNTER — Inpatient Hospital Stay (HOSPITAL_COMMUNITY)
Admission: EM | Admit: 2020-10-10 | Discharge: 2020-10-13 | DRG: 301 | Disposition: A | Discharge status: Home Health | Payer: Medicare Other | Attending: Internal Medicine | Admitting: Internal Medicine

## 2020-10-10 ENCOUNTER — Ambulatory Visit: Payer: Medicare Other | Admitting: Cardiology

## 2020-10-10 VITALS — BP 114/78 | HR 70 | Ht 67.0 in | Wt 134.0 lb

## 2020-10-10 DIAGNOSIS — M549 Dorsalgia, unspecified: Secondary | ICD-10-CM | POA: Diagnosis not present

## 2020-10-10 DIAGNOSIS — F32A Depression, unspecified: Secondary | ICD-10-CM | POA: Diagnosis not present

## 2020-10-10 DIAGNOSIS — Z8249 Family history of ischemic heart disease and other diseases of the circulatory system: Secondary | ICD-10-CM | POA: Insufficient documentation

## 2020-10-10 DIAGNOSIS — R296 Repeated falls: Secondary | ICD-10-CM | POA: Diagnosis not present

## 2020-10-10 DIAGNOSIS — I824Z2 Acute embolism and thrombosis of unspecified deep veins of left distal lower extremity: Secondary | ICD-10-CM

## 2020-10-10 DIAGNOSIS — Z8673 Personal history of transient ischemic attack (TIA), and cerebral infarction without residual deficits: Secondary | ICD-10-CM | POA: Diagnosis not present

## 2020-10-10 DIAGNOSIS — F419 Anxiety disorder, unspecified: Secondary | ICD-10-CM | POA: Diagnosis present

## 2020-10-10 DIAGNOSIS — R0602 Shortness of breath: Secondary | ICD-10-CM | POA: Diagnosis not present

## 2020-10-10 DIAGNOSIS — D7589 Other specified diseases of blood and blood-forming organs: Secondary | ICD-10-CM | POA: Diagnosis present

## 2020-10-10 DIAGNOSIS — I82409 Acute embolism and thrombosis of unspecified deep veins of unspecified lower extremity: Secondary | ICD-10-CM | POA: Diagnosis present

## 2020-10-10 DIAGNOSIS — R9431 Abnormal electrocardiogram [ECG] [EKG]: Secondary | ICD-10-CM | POA: Diagnosis not present

## 2020-10-10 DIAGNOSIS — R300 Dysuria: Secondary | ICD-10-CM | POA: Diagnosis not present

## 2020-10-10 DIAGNOSIS — J449 Chronic obstructive pulmonary disease, unspecified: Secondary | ICD-10-CM | POA: Diagnosis not present

## 2020-10-10 DIAGNOSIS — R2242 Localized swelling, mass and lump, left lower limb: Secondary | ICD-10-CM | POA: Insufficient documentation

## 2020-10-10 DIAGNOSIS — R269 Unspecified abnormalities of gait and mobility: Secondary | ICD-10-CM | POA: Diagnosis present

## 2020-10-10 DIAGNOSIS — I82432 Acute embolism and thrombosis of left popliteal vein: Secondary | ICD-10-CM | POA: Diagnosis not present

## 2020-10-10 DIAGNOSIS — Z8711 Personal history of peptic ulcer disease: Secondary | ICD-10-CM

## 2020-10-10 DIAGNOSIS — G8929 Other chronic pain: Secondary | ICD-10-CM | POA: Diagnosis present

## 2020-10-10 DIAGNOSIS — I82412 Acute embolism and thrombosis of left femoral vein: Secondary | ICD-10-CM

## 2020-10-10 DIAGNOSIS — R0600 Dyspnea, unspecified: Secondary | ICD-10-CM

## 2020-10-10 DIAGNOSIS — I82402 Acute embolism and thrombosis of unspecified deep veins of left lower extremity: Secondary | ICD-10-CM | POA: Diagnosis not present

## 2020-10-10 DIAGNOSIS — D696 Thrombocytopenia, unspecified: Secondary | ICD-10-CM | POA: Diagnosis not present

## 2020-10-10 DIAGNOSIS — G40909 Epilepsy, unspecified, not intractable, without status epilepticus: Secondary | ICD-10-CM

## 2020-10-10 DIAGNOSIS — Z87440 Personal history of urinary (tract) infections: Secondary | ICD-10-CM

## 2020-10-10 DIAGNOSIS — D72819 Decreased white blood cell count, unspecified: Secondary | ICD-10-CM | POA: Diagnosis present

## 2020-10-10 DIAGNOSIS — Z800 Family history of malignant neoplasm of digestive organs: Secondary | ICD-10-CM | POA: Diagnosis not present

## 2020-10-10 DIAGNOSIS — Z9189 Other specified personal risk factors, not elsewhere classified: Secondary | ICD-10-CM

## 2020-10-10 DIAGNOSIS — Z87442 Personal history of urinary calculi: Secondary | ICD-10-CM

## 2020-10-10 DIAGNOSIS — I480 Paroxysmal atrial fibrillation: Secondary | ICD-10-CM | POA: Diagnosis not present

## 2020-10-10 DIAGNOSIS — Z888 Allergy status to other drugs, medicaments and biological substances status: Secondary | ICD-10-CM

## 2020-10-10 DIAGNOSIS — Z20822 Contact with and (suspected) exposure to covid-19: Secondary | ICD-10-CM | POA: Diagnosis not present

## 2020-10-10 DIAGNOSIS — Z802 Family history of malignant neoplasm of other respiratory and intrathoracic organs: Secondary | ICD-10-CM

## 2020-10-10 DIAGNOSIS — Z87891 Personal history of nicotine dependence: Secondary | ICD-10-CM | POA: Diagnosis not present

## 2020-10-10 DIAGNOSIS — Z885 Allergy status to narcotic agent status: Secondary | ICD-10-CM

## 2020-10-10 DIAGNOSIS — I4892 Unspecified atrial flutter: Secondary | ICD-10-CM | POA: Insufficient documentation

## 2020-10-10 DIAGNOSIS — Z87820 Personal history of traumatic brain injury: Secondary | ICD-10-CM

## 2020-10-10 DIAGNOSIS — Z79899 Other long term (current) drug therapy: Secondary | ICD-10-CM

## 2020-10-10 DIAGNOSIS — K219 Gastro-esophageal reflux disease without esophagitis: Secondary | ICD-10-CM | POA: Diagnosis present

## 2020-10-10 DIAGNOSIS — G629 Polyneuropathy, unspecified: Secondary | ICD-10-CM | POA: Diagnosis not present

## 2020-10-10 DIAGNOSIS — I629 Nontraumatic intracranial hemorrhage, unspecified: Secondary | ICD-10-CM | POA: Diagnosis not present

## 2020-10-10 LAB — BASIC METABOLIC PANEL
Anion gap: 9 (ref 5–15)
BUN: 7 mg/dL — ABNORMAL LOW (ref 8–23)
CO2: 27 mmol/L (ref 22–32)
Calcium: 9.3 mg/dL (ref 8.9–10.3)
Chloride: 105 mmol/L (ref 98–111)
Creatinine, Ser: 0.78 mg/dL (ref 0.44–1.00)
GFR, Estimated: >60 mL/min (ref >60)
Glucose, Bld: 96 mg/dL (ref 70–99)
Potassium: 3.8 mmol/L (ref 3.5–5.1)
Sodium: 141 mmol/L (ref 135–145)

## 2020-10-10 LAB — CBC WITH DIFFERENTIAL/PLATELET
Abs Immature Granulocytes: 0 10*3/uL (ref 0.00–0.07)
Basophils Absolute: 0 10*3/uL (ref 0.0–0.1)
Basophils Relative: 0 %
Eosinophils Absolute: 0 10*3/uL (ref 0.0–0.5)
Eosinophils Relative: 1 %
HCT: 40.3 % (ref 36.0–46.0)
Hemoglobin: 12.9 g/dL (ref 12.0–15.0)
Immature Granulocytes: 0 %
Lymphocytes Relative: 42 %
Lymphs Abs: 1.5 10*3/uL (ref 0.7–4.0)
MCH: 34.5 pg — ABNORMAL HIGH (ref 26.0–34.0)
MCHC: 32 g/dL (ref 30.0–36.0)
MCV: 107.8 fL — ABNORMAL HIGH (ref 80.0–100.0)
Monocytes Absolute: 0.2 10*3/uL (ref 0.1–1.0)
Monocytes Relative: 6 %
Neutro Abs: 1.7 10*3/uL (ref 1.7–7.7)
Neutrophils Relative %: 51 %
Platelets: 168 10*3/uL (ref 150–400)
RBC: 3.74 MIL/uL — ABNORMAL LOW (ref 3.87–5.11)
RDW: 14 % (ref 11.5–15.5)
WBC: 3.4 10*3/uL — ABNORMAL LOW (ref 4.0–10.5)
nRBC: 0 % (ref 0.0–0.2)

## 2020-10-10 LAB — LIPID PANEL
Chol/HDL Ratio: 2.5 ratio (ref 0.0–4.4)
Cholesterol, Total: 219 mg/dL — ABNORMAL HIGH (ref 100–199)
HDL: 89 mg/dL (ref >39)
LDL Chol Calc (NIH): 112 mg/dL — ABNORMAL HIGH (ref 0–99)
Triglycerides: 106 mg/dL (ref 0–149)
VLDL Cholesterol Cal: 18 mg/dL (ref 5–40)

## 2020-10-10 LAB — URINALYSIS, ROUTINE W REFLEX MICROSCOPIC
Bilirubin Urine: NEGATIVE
Glucose, UA: NEGATIVE mg/dL
Hgb urine dipstick: NEGATIVE
Ketones, ur: NEGATIVE mg/dL
Nitrite: NEGATIVE
Protein, ur: NEGATIVE mg/dL
Specific Gravity, Urine: 1.013 (ref 1.005–1.030)
pH: 6 (ref 5.0–8.0)

## 2020-10-10 LAB — HEPATIC FUNCTION PANEL
ALT: 7 IU/L (ref 0–32)
AST: 15 IU/L (ref 0–40)
Albumin: 4.6 g/dL (ref 3.8–4.8)
Alkaline Phosphatase: 92 IU/L (ref 44–121)
Bilirubin Total: 0.2 mg/dL (ref 0.0–1.2)
Bilirubin, Direct: <0.1 mg/dL (ref 0.00–0.40)
Total Protein: 6.8 g/dL (ref 6.0–8.5)

## 2020-10-10 LAB — PROTIME-INR
INR: 1 (ref 0.8–1.2)
Prothrombin Time: 12.9 seconds (ref 11.4–15.2)

## 2020-10-10 LAB — SARS CORONAVIRUS 2 (TAT 6-24 HRS): SARS Coronavirus 2: NEGATIVE

## 2020-10-10 LAB — HEPARIN LEVEL (UNFRACTIONATED): Heparin Unfractionated: 0.27 IU/mL — ABNORMAL LOW (ref 0.30–0.70)

## 2020-10-10 MED ORDER — HEPARIN BOLUS VIA INFUSION
1500.0000 [IU] | Freq: Once | INTRAVENOUS | Status: AC
  Administered 2020-10-10: 1500 [IU] via INTRAVENOUS
  Filled 2020-10-10: qty 1500

## 2020-10-10 MED ORDER — GABAPENTIN 300 MG PO CAPS
300.0000 mg | ORAL_CAPSULE | Freq: Every day | ORAL | Status: DC
  Administered 2020-10-10 – 2020-10-12 (×3): 300 mg via ORAL
  Filled 2020-10-10 (×3): qty 1

## 2020-10-10 MED ORDER — ALBUTEROL SULFATE HFA 108 (90 BASE) MCG/ACT IN AERS
1.0000 | INHALATION_SPRAY | RESPIRATORY_TRACT | Status: DC | PRN
  Filled 2020-10-10: qty 6.7

## 2020-10-10 MED ORDER — ESCITALOPRAM OXALATE 10 MG PO TABS
10.0000 mg | ORAL_TABLET | Freq: Every day | ORAL | Status: DC
  Administered 2020-10-10 – 2020-10-12 (×3): 10 mg via ORAL
  Filled 2020-10-10 (×3): qty 1

## 2020-10-10 MED ORDER — ACETAMINOPHEN 500 MG PO TABS
1000.0000 mg | ORAL_TABLET | Freq: Four times a day (QID) | ORAL | Status: DC | PRN
  Administered 2020-10-11 – 2020-10-13 (×4): 1000 mg via ORAL
  Filled 2020-10-10 (×4): qty 2

## 2020-10-10 MED ORDER — CENOBAMATE 100 MG PO TABS
100.0000 mg | ORAL_TABLET | Freq: Every morning | ORAL | Status: DC
Start: 2020-10-11 — End: 2020-10-13

## 2020-10-10 MED ORDER — VITAMIN D 25 MCG (1000 UNIT) PO TABS
1000.0000 [IU] | ORAL_TABLET | Freq: Every day | ORAL | Status: DC
  Administered 2020-10-11 – 2020-10-13 (×3): 1000 [IU] via ORAL
  Filled 2020-10-10 (×3): qty 1

## 2020-10-10 MED ORDER — HEPARIN (PORCINE) 25000 UT/250ML-% IV SOLN
950.0000 [IU]/h | INTRAVENOUS | Status: DC
  Administered 2020-10-10: 14:00:00 1000 [IU]/h via INTRAVENOUS
  Administered 2020-10-11: 1150 [IU]/h via INTRAVENOUS
  Filled 2020-10-10 (×2): qty 250

## 2020-10-10 MED ORDER — LACOSAMIDE 50 MG PO TABS
200.0000 mg | ORAL_TABLET | Freq: Two times a day (BID) | ORAL | Status: DC
  Administered 2020-10-10 – 2020-10-13 (×6): 200 mg via ORAL
  Filled 2020-10-10 (×6): qty 4

## 2020-10-10 MED ORDER — ACETAMINOPHEN 500 MG PO TABS
1000.0000 mg | ORAL_TABLET | Freq: Once | ORAL | Status: AC
  Administered 2020-10-10: 1000 mg via ORAL
  Filled 2020-10-10: qty 2

## 2020-10-10 MED ORDER — PHENYTOIN SODIUM EXTENDED 30 MG PO CAPS
30.0000 mg | ORAL_CAPSULE | Freq: Every day | ORAL | Status: DC
  Administered 2020-10-10 – 2020-10-12 (×3): 30 mg via ORAL
  Filled 2020-10-10 (×4): qty 1

## 2020-10-10 MED ORDER — PHENYTOIN SODIUM EXTENDED 100 MG PO CAPS
100.0000 mg | ORAL_CAPSULE | Freq: Two times a day (BID) | ORAL | Status: DC
  Administered 2020-10-10 – 2020-10-13 (×6): 100 mg via ORAL
  Filled 2020-10-10 (×7): qty 1

## 2020-10-10 MED ORDER — LORAZEPAM 2 MG/ML IJ SOLN
1.0000 mg | INTRAMUSCULAR | Status: DC | PRN
Start: 2020-10-10 — End: 2020-10-13

## 2020-10-10 NOTE — Consult Note (Addendum)
Hospital Consult    Reason for Consult:  Recurrent DVT, need for IVC filter Requesting Physician:  Carmin Muskrat, MD MRN #:  [id number]  History of Present Illness: This is a 65 y.o. 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 She explains that it has worsened over the last several weeks and she has been having to ambulate with a walker. She reports several episodes of losing her balance and falling over the past several months. She says the pain initially started at her knee and went up her thigh but now she is having pain all the way down the leg to the left foot. Describes it as " just a pain". She says she has not been able to take tylenol for the pain because it interferes with her seizure medication. She has been concerned due to her toes and forefoot turing purple and blue. She additionally has been having swelling. She says this comes and goes. She has been elevating and trying to stay off her feet as much as possible. She was seen by her Cardiologist 2 weeks ago and had explained her symptoms and a ultrasound of her left leg was ordered. She then presented for follow up at her Cardiologists today to review ultrasound and it was positive for DVT. She was subsequently sent to ER for further evaluation and treatment of DVT.   She presently denies any chest pain or increased shortness of breath. She has asthma and COPD so she does have some shortness of breath on exertion at baseline. She has not been previously on anticoagulation for her PAF secondary to high risk due to her frequent falls and also with her history of craniotomy in 1989 as well as her seizure disorder. Cardiology discussed her case with Neurosurgery who feel that she could be initiated on Heparin without bolus to see if she tolerates it. She has been  started on IV Heparin at 1000 units/hr.  Vascular surgery has been consulted to see her to evaluate her for placement of IVC filter  Past Medical History:  Diagnosis Date   Asthma    Cerebral hemorrhage ([place]) 1989   Chronic back pain    COPD (chronic obstructive pulmonary disease) ([place])    DDD (degenerative disc disease) 06/12/2013   DVT (deep venous thrombosis) ([facility])    "she's had several since 1990"   GERD (gastroesophageal reflux disease)    Headache    "usually around time when she's had a seizure"   History of blood transfusion    "when she was a baby"   History of stomach ulcers    Kidney stones    Neuropathy    Seizures ([place])    "because of her brain surgery"   Stroke [facility]) 1990's   family denies residual on 11/09/2014   Tunnel vision    "since brain OR"    Past Surgical History:  Procedure Laterality Date   [place]   craniotomy with hematoma evacuation   [place]; 1987; 1989   HEMORRHOID SURGERY     TUBAL LIGATION  1990's    Allergies  Allergen Reactions   Zonisamide Other (See Comments)    Numbness and tingling over whole body   Chocolate Flavor Other (See Comments)    Trigger for seizures   Codeine Nausea And Vomiting   Keppra [  Levetiracetam] Other (See Comments)    Causes seizures   Pregabalin Nausea And Vomiting   Olive Oil Rash    Prior to Admission medications   Medication Sig Start Date End Date Taking? Authorizing Provider  acetaminophen (TYLENOL) 500 MG tablet Take 1,000 mg by mouth every 6 (six) hours as needed for mild pain.   Yes [provider]  albuterol (VENTOLIN HFA) 108 (90 Base) MCG/ACT inhaler Inhale 1 puff into the lungs every 4 (four) hours as needed for wheezing or shortness of breath.  03/30/20  Yes [provider]  cholecalciferol (VITAMIN D3) 25 MCG (1000 UNIT) tablet Take 1 tablet (1,000 Units total) by mouth daily. 01/24/20  Yes [name], MD  escitalopram  (LEXAPRO) 10 MG tablet Take 1 tablet (10 mg total) by mouth daily. Patient taking differently: Take 10 mg by mouth at bedtime. 06/06/20  Yes [name], MD  gabapentin (NEURONTIN) 300 MG capsule Take 1 capsule every night Patient taking differently: Take 300 mg by mouth at bedtime. 01/24/20  Yes [name], MD  LORazepam (ATIVAN) 1 MG tablet Take 1 tablet as needed for seizure. Do not take more than 2 in 24 hours. Patient taking differently: Take 1 mg by mouth See admin instructions. Take 1 mg by mouth as directed/as needed for 3 or more seizures/24 hours-  Do not take more than 2 mg in 24 hours 06/06/20  Yes [name], MD  phenytoin (DILANTIN) 100 MG ER capsule 100mg  in AM, 200 mg in PM Patient taking differently: Take 100 mg by mouth in the morning and at bedtime. 08/12/20  Yes Barb Merino, MD  phenytoin (DILANTIN) 30 MG ER capsule Take 30 mg by mouth at bedtime.   Yes [provider]  VIMPAT 200 MG TABS tablet TAKE 1 TABLET BY MOUTH  TWICE DAILY Patient taking differently: Take 200 mg by mouth in the morning and at bedtime. 10/08/20  Yes [name], MD  XCOPRI 100 MG TABS TAKE 1 TABLET BY MOUTH  DAILY Patient taking differently: Take 100 mg by mouth in the morning. 08/29/20  Yes [name], MD  [employer] B-12 500 MCG tablet TAKE 2 TABLETS BY MOUTH EVERY DAY Patient taking differently: Take 1,000 mcg by mouth daily. 01/24/20   [name], MD    Social History   Socioeconomic History   Marital status: Divorced    Spouse name: Not on file   Number of children: 3   Years of education: Not on file   Highest education level: Not on file  Occupational History   Not on file  Tobacco Use   Smoking status: Former Smoker    Packs/day: 0.50    Years: 20.00    Pack years: 10.00    Types: Cigarettes    Quit date: 10/26/2017    Years since quitting: 2.9   Smokeless tobacco: Never Used   Tobacco comment: Previously smoked 2 ppd, down to 4 cigs/day  Vaping  Use   Vaping Use: Never used  Substance and Sexual Activity   Alcohol use: No   Drug use: No   Sexual activity: Never    Birth control/protection: Post-menopausal  Other Topics Concern   Not on file  Social History Narrative   Pt lives in 2 story home with her daughter, daughter's family and pt's mother-in-law   Has 2 living children / 1 deceased   2022/10/25 grade education   Worked for [employer] until 1998 when she became disabled.  Right handed   Social Determinants of Health   Financial Resource Strain: Not on file  Food Insecurity: Not on file  Transportation Needs: Not on file  Physical Activity: Not on file  Stress: Not on file  Social Connections: Not on file  Intimate Partner Violence: Not on file    Family History  Problem Relation Age of Onset   Heart attack Mother        4 MIs, started in her 45s, also vaginal cancer and colon cancer   Cancer Mother    Heart attack Father        Died suddenly age 59 - autopsy revealed heart attack her patient   Cancer Brother        Sinus cancer    ROS: Otherwise negative unless mentioned in HPI  Physical Examination  Vitals:   10/10/20 1215 10/10/20 1230  BP: (!) 117/55 110/68  Pulse: (!) 55 (!) 54  Resp: 18 16  Temp:    SpO2: 99% 98%   Body mass index is 20.36 kg/m.  General:  WDWN in NAD Gait: Not observed HENT: WNL, normocephalic Pulmonary: normal non-labored breathing, without wheezing Cardiac: regular, without  Murmurs Abdomen: soft, NT/ND, no masses Vascular Exam/Pulses: 2+ radial pulses bilaterally, 2+ femoral pulses, 2+ DP and PT pulses bilaterally Extremities: without ischemic changes, without Gangrene , without cellulitis; without open wounds;  Bilateral feet are warm and well perfused. Very minimal swelling of the left leg. She does have more significant reticular veins around left ankle and foot with some hyperpigmentation of dorsum of left foot. Motor and sensation are intact.   Musculoskeletal: no muscle wasting or atrophy  Neurologic: A&O X 3;  No focal weakness or paresthesias are detected; speech is fluent/normal Psychiatric:  The pt has Normal affect. Lymph:  Unremarkable  CBC    Component Value Date/Time   WBC 5.4 08/12/2020 0155   RBC 3.06 (L) 08/12/2020 0155   HGB 10.4 (L) 08/12/2020 0155   HCT 31.5 (L) 08/12/2020 0155   HCT 38.5 02/17/2017 0930   PLT 319 08/12/2020 0155   MCV 102.9 (H) 08/12/2020 0155   MCH 34.0 08/12/2020 0155   MCHC 33.0 08/12/2020 0155   RDW 12.5 08/12/2020 0155   RDW 12.9 06/10/2013 0812   LYMPHSABS 0.8 08/04/2020 0703   LYMPHSABS 1.4 06/10/2013 0812   MONOABS 0.4 08/04/2020 0703   EOSABS 0.0 08/04/2020 0703   EOSABS 0.0 06/10/2013 0812   BASOSABS 0.0 08/04/2020 0703   BASOSABS 0.0 06/10/2013 0812    BMET    Component Value Date/Time   NA 140 08/09/2020 0122   NA 145 (H) 06/10/2013 0812   K 4.6 08/09/2020 0122   CL 105 08/09/2020 0122   CO2 24 08/09/2020 0122   GLUCOSE 88 08/09/2020 0122   BUN 7 (L) 08/09/2020 0122   BUN 13 06/10/2013 0812   CREATININE 0.75 08/09/2020 0122   CALCIUM 8.2 (L) 08/09/2020 0122   GFRNONAA >60 08/09/2020 0122   GFRAA >60 04/24/2020 1259    COAGS: Lab Results  Component Value Date   INR 1.0 04/24/2020   INR 1.09 07/17/2018   INR 1.10 05/29/2018     Non-Invasive Vascular Imaging:    +---------+---------------+---------+-----------+------------+-------------  ----+   LEFT    Compressibility Phasicity Spontaneity Properties  Thrombus  Aging     +---------+---------------+---------+-----------+------------+-------------  ----+   CFV    Partial     Yes    Yes     softly    Acute  echogenic               +---------+---------------+---------+-----------+------------+-------------  ----+   SFJ    Partial     Yes    Yes     softly    Acute                                        echogenic               +---------+---------------+---------+-----------+------------+-------------  ----+   FV Prox  Partial     Yes    No      softly    Acute                                       echogenic               +---------+---------------+---------+-----------+------------+-------------  ----+   FV Mid   None       No     No      softly    Acute                                       echogenic               +---------+---------------+---------+-----------+------------+-------------  ----+   FV Distal None       No     No      softly    Acute                                       echogenic               +---------+---------------+---------+-----------+------------+-------------  ----+   PFV    Full       Yes    Yes                        +---------+---------------+---------+-----------+------------+-------------  ----+   POP    Partial     No     No      softly and  Age  Indeterminate                                brightly                                            echogenic               +---------+---------------+---------+-----------+------------+-------------  ----+   PTV    Full       Yes    Yes                        +---------+---------------+---------+-----------+------------+-------------  ----+   PERO    Full       Yes    Yes                        +---------+---------------+---------+-----------+------------+-------------  ----+  Gastroc  Full                                       +---------+---------------+---------+-----------+------------+-------------   ----+   GSV    Full       Yes    Yes                        +---------+---------------+---------+-----------+------------+-------------  ----+    Statin:  No. Beta Blocker:  No. Aspirin:  No. ACEI:  No. ARB:  No. CCB use:  No Other antiplatelets/anticoagulants:  Yes.   Heparin IV   ASSESSMENT/PLAN: This is a 65 y.o. female who presents with increased left leg swelling and pain for 3 months with increased pain over past several weeks. Duplex ultrasound obtained at her Cardiologist office showed positive DVT of the CFV, [place], femoral vein and popliteal vein of the left lower extremity. She has no signs of vascular compromise or phlegmasia. She has easily palpable pulses in the left lower extremity with sensation and motor intact. I have placed order for compression stocking.She also should try to elevate her left leg. -She has been started on IV Heparin per neurosurgery parameters due to hx of ICH, seizures and multiple falls - Due to her high bleed risk will considered placement of IVC filter if she does not tolerate heparin/ transition to oral anticoagulation - On call vascular surgeon Dr. Trula Slade will see and evaluation patient later today to provide further management plans  [name] PA-C [facility] [phone] 10/10/2020  1:00 PM   I agree with the above.  I have seen and evaluated the patient.  She is a 65 year old female with approximately 66-month history of pain in her left leg.  Duplex ultrasound was performed that showed acute DVT.  She is not a good candidate for anticoagulation because of frequent falls.  I discussed placing a IVC filter for protection from life-threatening pulmonary embolus.  The patient definitely wants to get this done as she is worried about clots going to her lung.  I told her we would try to get this done today however there is a possibility that it needs to be done tomorrow due to scheduling.   She should remain on heparin.  I will allow her to eat today.  Annamarie Major

## 2020-10-10 NOTE — ED Notes (Signed)
Got patient on the monitor did ekg shown to Dr Vanita Panda got patient some warm blankets patient is resting with family at bedside and call bell in reach

## 2020-10-10 NOTE — Progress Notes (Signed)
ANTICOAGULATION CONSULT NOTE - Initial Consult  Pharmacy Consult for heparin Indication: DVT  Allergies  Allergen Reactions  . Zonisamide Other (See Comments)    Numbness and tingling over whole body  . Chocolate Flavor Other (See Comments)    Trigger for Seizure  . Codeine Nausea And Vomiting  . Keppra [Levetiracetam] Other (See Comments)    Causes seizures  . Pregabalin Nausea And Vomiting  . Olive Oil Rash    Patient Measurements: Height: 5\' 7"  (170.2 cm) Weight: 59 kg (130 lb) IBW/kg (Calculated) : 61.6 Heparin Dosing Weight: TBW  Vital Signs: Temp: 98.3 F (36.8 C) (03/09 1020) Temp Source: Oral (03/09 1020) BP: 109/55 (03/09 1145) Pulse Rate: 65 (03/09 1145)  Labs: No results for input(s): HGB, HCT, PLT, APTT, LABPROT, INR, HEPARINUNFRC, HEPRLOWMOCWT, CREATININE, CKTOTAL, CKMB, TROPONINIHS in the last 72 hours.  CrCl cannot be calculated (Patient's most recent lab result is older than the maximum 21 days allowed.).   Medical History: Past Medical History:  Diagnosis Date  . Asthma   . Cerebral hemorrhage ([place]) 1989  . Chronic back pain   . COPD (chronic obstructive pulmonary disease) ([place])   . DDD (degenerative disc disease) 06/12/2013  . DVT (deep venous thrombosis) ([place])    "she's had several since 1990"  . GERD (gastroesophageal reflux disease)   . Headache    "usually around time when she's had a seizure"  . History of blood transfusion    "when she was a baby"  . History of stomach ulcers   . Kidney stones   . Neuropathy   . Seizures ([place])    "because of her brain surgery"  . Stroke [facility]) [phone]   family denies residual on 11/09/2014  . Tunnel vision    "since brain OR"   Assessment: 41 YOF presenting from clinic with DVT on ultrasound today in LLE.  Hx of DVT and pAflutter not on anticoagulation PTA d/t high risk for bleeding complications (falls, hx ICH).  Will not give full bolus  Goal of Therapy:  Heparin level 0.3-0.7 units/ml Monitor  platelets by anticoagulation protocol: Yes   Plan:  Heparin 1500 units IV x 1, and gtt at 1000 units/hr F/u 6 hour heparin level F/u [facility] plan  Bertis Ruddy, PharmD Clinical Pharmacist ED Pharmacist Phone # [phone] 10/10/2020 12:28 PM

## 2020-10-10 NOTE — Progress Notes (Unsigned)
Patient was seen in office today and a bilateral lower extremity venous duplex was performed. There was no evidence of DVT noted in the right lower extremity, but there was evidence of extensive DVT noted in the left lower extremity. Preliminary report can be found under the CV Procedure tab.  Thank you,  Sharlett Iles, RDMS, RVT

## 2020-10-10 NOTE — Progress Notes (Signed)
Cardiology Office Note:    Date:  10/10/2020   ID:  [name], DOB 1956-03-22, MRN [id number]  PCP:  [name], MD  Cardiologist:  Pixie Casino, MD   Referring MD: [name], MD   Chief Complaint  Patient presents with  . Follow-up    DVT.  Marland Kitchen Edema    Ankles and feet.  PAF  History of Present Illness:    [name] is a 65 y.o. 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  She has a family history of CAD with mom having 4 MI's in her 43s and sudden cardiac death in her father at age 27. She was seen in July 2018 by Dr. Debara Pickett for chest pain, felt to be noncardiac, but she did not come to follow up appt. She had COVID-19 PNA Sept 2021.   She was recently seen by Fabian Sharp, [place] on 09/26/20. She was noted to have left leg swelling at that time, ultrasound ordered for DVT.  Today: Seen as urgent DOD add on visit as DVT ultrasound is positive in the left leg. She has a complex history, including seizures and prior intracranial hemorrhage. She was recently hospitalized, and anticoagulation for upper extremity DVT and atrial flutter was deferred at that time as she was high risk for bleeding complications.  She has had a headache over the weekend. Notes that her leg has been bothering her since right after she got home from the hospital. Had a mechanical fall at home, leg has been bothering her since that time. She had a seizure yesterday. Daughter present, both very concerned about anticoagulation given her history.  Per note from 08/10/20 from Dr. Algis Liming: "Discussed with Neurosurgery, Mr. [name], PA-C 1/6 who discussed with his attending MD on call: It appears that patient had an [place] from AVM and underwent craniotomy with hematoma evacuation in 1989.  They recommend initiation of IV heparin, closely  monitoring, getting repeat head CT in 24 hours (yet to be done) and if tolerates then can transition to oral anticoagulation."  We discussed options, and she is amenable to presenting to ER, likely for admission for monitored anticoagulation with the assistance of neurology/neurosurgery.    Past Medical History:  Diagnosis Date  . Asthma   . Cerebral hemorrhage ([place]) 1989  . Chronic back pain   . COPD (chronic obstructive pulmonary disease) ([place])   . DDD (degenerative disc disease) 06/12/2013  . DVT (deep venous thrombosis) ([place])    "she's had several since 1990"  . GERD (gastroesophageal reflux disease)   . Headache    "usually around time when she's had a seizure"  . History of blood transfusion    "when she was a baby"  . History of stomach ulcers   . Kidney stones   . Neuropathy   . Seizures ([place])    "because of her brain surgery"  . Stroke [facility]) [phone]   family denies residual on 11/09/2014  . Tunnel vision    "since brain OR"    Past Surgical History:  Procedure Laterality Date  . [place]   craniotomy with hematoma evacuation  . CESAREAN SECTION  1979; 1987; 1989  . HEMORRHOID SURGERY    . TUBAL LIGATION  1990's    Current Medications: Current Meds  Medication Sig  . acetaminophen (TYLENOL) 500 MG tablet Take  1,000 mg by mouth every 6 (six) hours as needed for mild pain.  Marland Kitchen albuterol (VENTOLIN HFA) 108 (90 Base) MCG/ACT inhaler Inhale 1 puff into the lungs every 4 (four) hours as needed for wheezing or shortness of breath.   . cholecalciferol (VITAMIN D3) 25 MCG (1000 UNIT) tablet Take 1 tablet (1,000 Units total) by mouth daily.  . [employer] B-12 500 MCG tablet TAKE 2 TABLETS BY MOUTH EVERY DAY (Patient taking differently: Take 1,000 mcg by mouth daily.)  . escitalopram (LEXAPRO) 10 MG tablet Take 1 tablet (10 mg total) by mouth daily.  Marland Kitchen gabapentin (NEURONTIN) 300 MG capsule Take 1 capsule every night (Patient taking differently: Take 300 mg by mouth at  bedtime.)  . LORazepam (ATIVAN) 1 MG tablet Take 1 tablet as needed for seizure. Do not take more than 2 in 24 hours. (Patient taking differently: Take 1 mg by mouth See admin instructions. Take 1 tablet as needed for seizure. Do not take more than 2 in 24 hours.)  . phenytoin (DILANTIN) 100 MG ER capsule 100mg  in AM, 200 mg in PM (Patient taking differently: 100mg  in AM, 130 mg in PM)  . VIMPAT 200 MG TABS tablet TAKE 1 TABLET BY MOUTH  TWICE DAILY  . XCOPRI 100 MG TABS TAKE 1 TABLET BY MOUTH  DAILY     Allergies:   Zonisamide, Chocolate flavor, Codeine, Keppra [levetiracetam], Pregabalin, and Olive oil   Social History   Socioeconomic History  . Marital status: Divorced    Spouse name: Not on file  . Number of children: 3  . Years of education: Not on file  . Highest education level: Not on file  Occupational History  . Not on file  Tobacco Use  . Smoking status: Former Smoker    Packs/day: 0.50    Years: 20.00    Pack years: 10.00    Types: Cigarettes    Quit date: 10/26/2017    Years since quitting: 2.9  . Smokeless tobacco: Never Used  . Tobacco comment: Previously smoked 2 ppd, down to 4 cigs/day  Vaping Use  . Vaping Use: Never used  Substance and Sexual Activity  . Alcohol use: No  . Drug use: No  . Sexual activity: Never    Birth control/protection: Post-menopausal  Other Topics Concern  . Not on file  Social History Narrative   Pt lives in 2 story home with her daughter, daughter's family and pt's mother-in-law   Has 2 living children / 1 deceased   2022-10-17 grade education   Worked for [employer] until 1998 when she became disabled.    Right handed   Social Determinants of Health   Financial Resource Strain: Not on file  Food Insecurity: Not on file  Transportation Needs: Not on file  Physical Activity: Not on file  Stress: Not on file  Social Connections: Not on file     Family History: The patient's family history includes Cancer in her brother and  mother; Heart attack in her father and mother.  ROS:   Please see the history of present illness.     All other systems reviewed and are negative.  EKGs/Labs/Other Studies Reviewed:    The following studies were reviewed today: Ultrasounds venous 10/10/20 Summary:  RIGHT:  - No evidence of deep vein thrombosis in the lower extremity. No indirect  evidence of obstruction proximal to the inguinal ligament.  - No cystic structure found in the popliteal fossa.    LEFT:  - Findings consistent with  acute deep vein thrombosis involving the left  common femoral vein, SF junction, and left femoral vein.  - Findings consistent with age indeterminate deep vein thrombosis  involving the left popliteal vein.  - No cystic structure found in the popliteal fossa.      Echo 08/03/20: 1. Left ventricular ejection fraction, by estimation, is 55 to 60%. The  left ventricle has normal function. The left ventricle has no regional  wall motion abnormalities. Left ventricular diastolic parameters are  indeterminate.  2. Right ventricular systolic function is normal. The right ventricular  size is normal. There is mildly elevated pulmonary artery systolic  pressure. The estimated right ventricular systolic pressure is 02.7 mmHg.  3. The mitral valve is normal in structure. No evidence of mitral valve  regurgitation. No evidence of mitral stenosis.  4. The aortic valve is tricuspid. Aortic valve regurgitation is not  visualized. No aortic stenosis is present.  5. The inferior vena cava is dilated in size with <50% respiratory  variability, suggesting right atrial pressure of 15 mmHg.    EKG:  EKG is reviewed today.  The ekg ordered 09/26/20 demonstrates sinus rhythm HR 83  Recent Labs: 08/02/2020: TSH 1.332 08/08/2020: ALT 17 08/09/2020: BUN 7; Creatinine, Ser 0.75; Magnesium 2.3; Potassium 4.6; Sodium 140 08/12/2020: Hemoglobin 10.4; Platelets 319  Recent Lipid Panel    Component Value  Date/Time   CHOL 161 05/24/2018 0556   TRIG 80 04/02/2020 1730   HDL 74 05/24/2018 0556   CHOLHDL 2.2 05/24/2018 0556   VLDL 6 05/24/2018 0556   LDLCALC 81 05/24/2018 0556    Physical Exam:    VS:  BP 114/78 (BP Location: Left Arm, Patient Position: Sitting, Cuff Size: Normal)   Pulse 70   Ht 5\' 7"  (1.702 m)   Wt 134 lb (60.8 kg)   BMI 20.99 kg/m     Wt Readings from Last 3 Encounters:  10/10/20 134 lb (60.8 kg)  09/26/20 139 lb 3.2 oz (63.1 kg)  08/02/20 138 lb 14.2 oz (63 kg)    GEN: Well nourished, well developed in no acute distress HEENT: Normal, moist mucous membranes NECK: No JVD CARDIAC: regular rhythm, normal S1 and S2, no rubs or gallops. No murmur. VASCULAR: Radial and DP pulses 2+ bilaterally. No carotid bruits RESPIRATORY:  Clear to auscultation without rales, wheezing or rhonchi  ABDOMEN: Soft, non-tender, non-distended MUSCULOSKELETAL:  Ambulates independently, has tenderness in the left extremity SKIN: Warm and dry, no edema NEUROLOGIC:  Alert and oriented x 3. No focal neuro deficits noted. PSYCHIATRIC:  Normal affect   ASSESSMENT:    1. Acute deep vein thrombosis (DVT) of femoral vein of left lower extremity (HCC)   2. History of Intracranial bleed   3. At high risk for bleeding   4. Paroxysmal atrial fibrillation (HCC)    PLAN:    Acute left lower extremity DVT: -complicated by extensive history of neurologic issues, including intracranial hemorrhage and seizure disorder -reviewed evaluation during recent hospitalization. At that time, neurosurgery recommended heparin drip with inpatient monitoring and head CT to make sure there is no hemorrhage -given this risk, referred her to the ER. She will need medicine admission for anticoagulation and likely neurology/neurosurgery assistance for monitoring -would also consider IVC filter placement given prior history of DVT as well, high risk of long term anticoagulation  Paroxysmal atrial  fibrillation -sinus by exam today -CHA2DS2/VAS Stroke Risk Points=4, but elevated bleeding risk as above  Daughter will drive patient to ER at [facility]  now for further evaluation and likely admission.  Medication Adjustments/Labs and Tests Ordered: Current medicines are reviewed at length with the patient today.  Concerns regarding medicines are outlined above.  No orders of the defined types were placed in this encounter.  No orders of the defined types were placed in this encounter.  Patient Instructions  Medication Instructions:  Your Physician recommend you continue on your current medication as directed.    *If you need a refill on your cardiac medications before your next appointment, please call your pharmacy*   Lab Work: None    Testing/Procedures: None   Follow-Up: At [facility], you and your health needs are our priority.  As part of our continuing mission to provide you with exceptional heart care, we have created designated Provider Care Teams.  These Care Teams include your primary Cardiologist (physician) and Advanced Practice Providers (APPs -  Physician Assistants and Nurse Practitioners) who all work together to provide you with the care you need, when you need it.  We recommend signing up for the patient portal called "MyChart".  Sign up information is provided on this After Visit Summary.  MyChart is used to connect with patients for Virtual Visits (Telemedicine).  Patients are able to view lab/test results, encounter notes, upcoming appointments, etc.  Non-urgent messages can be sent to your provider as well.   To learn more about what you can do with MyChart, go to [email or web address].    Your next appointment:   Based on hospital follow up   Other Instructions You have a blood clot in your left leg. Given your history, the safest option is to have you started on a blood thinner in the hospital so that they can make sure you don't have any brain  bleeding. They may also talk about placing an IVC filter to prevent the clot from going to your lungs. My recommendation is to go to the ER, where the medicine team can admit you to the hospital and get neurology/neurosurgery's assistance in the treatment plan.   Signed, Buford Dresser, MD  10/10/2020 9:55 AM    [facility]

## 2020-10-10 NOTE — Telephone Encounter (Signed)
FYI

## 2020-10-10 NOTE — H&P (Addendum)
History and Physical    [name] VZD:[id number] DOB: March 22, 1956 DOA: 10/10/2020  Referring MD/NP/PA:Robert Vanita Panda, MD PCP: [name], MD  Patient coming from: From ultrasound  Chief Complaint: Left leg swelling  I have personally briefly reviewed patient's old medical records in [place]   HPI: [name] is a 65 y.o. 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  She complained that her feet had been turning dark purple color and made her scared that she could lose her foot.    Patient had reported symptoms of the left leg swelling and pain during last cardiology appointment on 2/23 and a Doppler ultrasound was ordered.  Patient had follow back up in clinic today and ultrasound revealed an acute deep vein thrombosis involving left common femoral vein, SF junction, and left femoral vein along with a indeterminate deep vein thrombosis involving the left popliteal vein was also seen.  Patient denies any complaints of chest pain, but chronically has issues with shortness of breath that she reports is unchanged.  She last fell approximately 3 weeks ago, but admits that she had a seizure yesterday.  Given her high falls risk anticoagulation had not been recommended in the past.  She complains of having some mild burning with urination as well. Currently patient reports that she has been ambulating with use of walker.  Records note patient had a right basilic vein thrombosis during hospitalization in January 2022, but due to patient's very high risk of bleeding from fall, history of traumatic brain injury,  baseline ambulatory dysfunction, and seizure disorder after discussions with patient and family it was decided that the risk of bleeding was too  high.  She had only been temporarily placed on heparin while in the hospital.  ED Course: Upon admission into the emergency department patient was seen to be afebrile with heart rates 53-73, and all other vital signs relatively maintained.  Labs significant for WBC 3.4 and INR 1.  Heparin had been ordered per pharmacy and vascular surgery consulted for need of IVC filter.  Patient was given Tylenol 1000 mg po for pain symptoms.  TR[facility] called to admit.  Review of Systems  Cardiovascular: Positive for leg swelling.  Genitourinary: Positive for dysuria.  Musculoskeletal: Positive for myalgias.  Neurological: Positive for seizures.  All other systems reviewed and are negative.   Past Medical History:  Diagnosis Date  . Asthma   . Cerebral hemorrhage ([place]) 1989  . Chronic back pain   . COPD (chronic obstructive pulmonary disease) ([place])   . DDD (degenerative disc disease) 06/12/2013  . DVT (deep venous thrombosis) ([place])    "she's had several since 1990"  . GERD (gastroesophageal reflux disease)   . Headache    "usually around time when she's had a seizure"  . History of blood transfusion    "when she was a baby"  . History of stomach ulcers   . Kidney stones   . Neuropathy   . Seizures ([place])    "because of her brain surgery"  . Stroke [facility]) [phone]   family denies residual on 11/09/2014  . Tunnel vision    "since brain OR"    Past Surgical History:  Procedure Laterality Date  . [place]   craniotomy with hematoma evacuation  . CESAREAN SECTION  1979; 1987; 1989  . HEMORRHOID SURGERY    . TUBAL LIGATION  1990's     reports that she quit smoking about 2 years ago. Her smoking use included cigarettes. She has a 10.00 pack-year smoking history. She has never used smokeless tobacco. She reports that she does not drink alcohol and does not use drugs.  Allergies  Allergen Reactions  . Zonisamide Other (See Comments)    Numbness and tingling over whole body  . Chocolate  Flavor Other (See Comments)    Trigger for seizures  . Codeine Nausea And Vomiting  . Keppra [Levetiracetam] Other (See Comments)    Causes seizures  . Pregabalin Nausea And Vomiting  . Olive Oil Rash    Family History  Problem Relation Age of Onset  . Heart attack Mother        4 MIs, started in her 22s, also vaginal cancer and colon cancer  . Cancer Mother   . Heart attack Father        Died suddenly age 88 - autopsy revealed heart attack her patient  . Cancer Brother        Sinus cancer    Prior to Admission medications   Medication Sig Start Date End Date Taking? Authorizing Provider  acetaminophen (TYLENOL) 500 MG tablet Take 1,000 mg by mouth every 6 (six) hours as needed for mild pain.   Yes [provider]  albuterol (VENTOLIN HFA) 108 (90 Base) MCG/ACT inhaler Inhale 1 puff into the lungs every 4 (four) hours as needed for wheezing or shortness of breath.  03/30/20  Yes [provider]  cholecalciferol (VITAMIN D3) 25 MCG (1000 UNIT) tablet Take 1 tablet (1,000 Units total) by mouth daily. 01/24/20  Yes [name], MD  escitalopram (LEXAPRO) 10 MG tablet Take 1 tablet (10 mg total) by mouth daily. Patient taking differently: Take 10 mg by mouth at bedtime. 06/06/20  Yes [name], MD  gabapentin (NEURONTIN) 300 MG capsule Take 1 capsule every night Patient taking differently: Take 300 mg by mouth at bedtime. 01/24/20  Yes [name], MD  LORazepam (ATIVAN) 1 MG tablet Take 1 tablet as needed for seizure. Do not take more than 2 in 24 hours. Patient taking differently: Take 1 mg by mouth See admin instructions. Take 1 mg by mouth as directed/as needed for 3 or more seizures/24 hours-  Do not take more than 2 mg in 24 hours 06/06/20  Yes [name], MD  phenytoin (DILANTIN) 100 MG ER capsule 100mg  in AM, 200 mg in PM Patient taking differently: Take 100 mg by mouth in the morning and at bedtime. 08/12/20  Yes Barb Merino, MD  phenytoin  (DILANTIN) 30 MG ER capsule Take 30 mg by mouth at bedtime.   Yes [provider]  VIMPAT 200 MG TABS tablet TAKE 1 TABLET BY MOUTH  TWICE DAILY Patient taking differently: Take 200 mg by mouth in the morning and at bedtime. 10/08/20  Yes [name], MD  XCOPRI 100 MG TABS TAKE 1 TABLET BY MOUTH  DAILY Patient taking differently: Take 100 mg by mouth in the morning. 08/29/20  Yes [name], MD  [employer] B-12 500 MCG tablet TAKE 2 TABLETS BY MOUTH EVERY DAY Patient taking differently: Take 1,000 mcg by mouth daily. 01/24/20   [name], MD    Physical Exam:  Constitutional: Older female who appears to be in no acute distress at this time Vitals:   10/10/20 1215  10/10/20 1230 10/10/20 1345 10/10/20 1400  BP: (!) 117/55 110/68 (!) 112/56 (!) 115/95  Pulse: (!) 55 (!) 54 72 (!) 57  Resp: 18 16 20 11   Temp:      TempSrc:      SpO2: 99% 98% 95% 98%  Weight:      Height:       Eyes: PERRL, lids and conjunctivae normal ENMT: Mucous membranes are moist. Posterior pharynx clear of any exudate or lesions. .  Neck: normal, supple, no masses, no thyromegaly Respiratory: clear to auscultation bilaterally, no wheezing, no crackles. Normal respiratory effort. No accessory muscle use.  Cardiovascular: Bradycardic, no murmurs / rubs / gallops. No significant lower extremity edema. 2+ pedal pulses. No carotid bruits.  Abdomen: no tenderness, no masses palpated. No hepatosplenomegaly. Bowel sounds positive.  Musculoskeletal: no clubbing / cyanosis.  Tenderness to palpation of the right leg. Skin: no rashes, lesions, ulcers. No induration Neurologic: CN 2-12 grossly intact.  Able to move all extremities. Psychiatric: Normal judgment and insight. Alert and oriented x 3. Normal mood.     Labs on Admission: I have personally reviewed following labs and imaging studies  CBC: Recent Labs  Lab 10/10/20 1247  WBC 3.4*  NEUTROABS 1.7  HGB 12.9  HCT 40.3  MCV 107.8*  PLT 528    Basic Metabolic Panel: Recent Labs  Lab 10/10/20 1247  NA 141  K 3.8  CL 105  CO2 27  GLUCOSE 96  BUN 7*  CREATININE 0.78  CALCIUM 9.3   GFR: Estimated Creatinine Clearance: 67.9 mL/min (by C-G formula based on SCr of 0.78 mg/dL). Liver Function Tests: No results for input(s): AST, ALT, ALKPHOS, BILITOT, PROT, ALBUMIN in the last 168 hours. No results for input(s): LIPASE, AMYLASE in the last 168 hours. No results for input(s): AMMONIA in the last 168 hours. Coagulation Profile: Recent Labs  Lab 10/10/20 1247  INR 1.0   Cardiac Enzymes: No results for input(s): CKTOTAL, CKMB, CKMBINDEX, TROPONINI in the last 168 hours. BNP (last 3 results) No results for input(s): PROBNP in the last 8760 hours. HbA1C: No results for input(s): HGBA1C in the last 72 hours. CBG: No results for input(s): GLUCAP in the last 168 hours. Lipid Profile: No results for input(s): CHOL, HDL, LDLCALC, TRIG, CHOLHDL, LDLDIRECT in the last 72 hours. Thyroid Function Tests: No results for input(s): TSH, T4TOTAL, FREET4, T3FREE, THYROIDAB in the last 72 hours. Anemia Panel: No results for input(s): VITAMINB12, FOLATE, FERRITIN, TIBC, IRON, RETICCTPCT in the last 72 hours. Urine analysis:    Component Value Date/Time   COLORURINE AMBER (A) 08/02/2020 1415   APPEARANCEUR HAZY (A) 08/02/2020 1415   LABSPEC 1.044 (H) 08/02/2020 1415   PHURINE 5.0 08/02/2020 1415   GLUCOSEU NEGATIVE 08/02/2020 1415   HGBUR MODERATE (A) 08/02/2020 1415   BILIRUBINUR NEGATIVE 08/02/2020 1415   KETONESUR NEGATIVE 08/02/2020 1415   PROTEINUR 100 (A) 08/02/2020 1415   UROBILINOGEN 0.2 01/17/2015 2350   NITRITE POSITIVE (A) 08/02/2020 1415   LEUKOCYTESUR LARGE (A) 08/02/2020 1415   Sepsis Labs: No results found for this or any previous visit (from the past 240 hour(s)).   Radiological Exams on Admission: VAS [place] LOWER EXTREMITY VENOUS (DVT)  Result Date: 10/10/2020  Lower Venous DVT Study Indications: History of  DVT. Patient presents today with complaints of bilateral lower extremity pain, left worse than right since falling down 15 stairs on 08/02/2020. She also had a fall in the shower about 3 weeks ago. She denies any SOB.  Risk Factors: DVT history starting in 06/2013. Trauma to the legs after fall on 08/02/2020 and injury to the head and back after fall in the shower about 3 weeks ago. Comparison Study: NA Performing Technologist: Sharlett Iles RVT  Examination Guidelines: A complete evaluation includes B-mode imaging, spectral Doppler, color Doppler, and power Doppler as needed of all accessible portions of each vessel. Bilateral testing is considered an integral part of a complete examination. Limited examinations for reoccurring indications may be performed as noted. The reflux portion of the exam is performed with the patient in reverse Trendelenburg.  +---------+---------------+---------+-----------+----------+--------------+ RIGHT    CompressibilityPhasicitySpontaneityPropertiesThrombus Aging +---------+---------------+---------+-----------+----------+--------------+ CFV      Full           Yes      Yes                                 +---------+---------------+---------+-----------+----------+--------------+ SFJ      Full           Yes      Yes                                 +---------+---------------+---------+-----------+----------+--------------+ FV Prox  Full           Yes      Yes                                 +---------+---------------+---------+-----------+----------+--------------+ FV Mid   Full                                                        +---------+---------------+---------+-----------+----------+--------------+ FV DistalFull           Yes      Yes                                 +---------+---------------+---------+-----------+----------+--------------+ PFV      Full           Yes      Yes                                  +---------+---------------+---------+-----------+----------+--------------+ POP      Full           Yes      Yes                                 +---------+---------------+---------+-----------+----------+--------------+ PTV      Full           Yes      Yes                                 +---------+---------------+---------+-----------+----------+--------------+ PERO     Full           Yes      Yes                                 +---------+---------------+---------+-----------+----------+--------------+  Gastroc  Full                                                        +---------+---------------+---------+-----------+----------+--------------+ GSV      Full           Yes      Yes                                 +---------+---------------+---------+-----------+----------+--------------+   +---------+---------------+---------+-----------+------------+-----------------+ LEFT     CompressibilityPhasicitySpontaneityProperties  Thrombus Aging    +---------+---------------+---------+-----------+------------+-----------------+ CFV      Partial        Yes      Yes        softly      Acute                                                         echogenic                     +---------+---------------+---------+-----------+------------+-----------------+ SFJ      Partial        Yes      Yes        softly      Acute                                                         echogenic                     +---------+---------------+---------+-----------+------------+-----------------+ FV Prox  Partial        Yes      No         softly      Acute                                                         echogenic                     +---------+---------------+---------+-----------+------------+-----------------+ FV Mid   None           No       No         softly      Acute                                                         echogenic                      +---------+---------------+---------+-----------+------------+-----------------+ FV DistalNone           No  No         softly      Acute                                                         echogenic                     +---------+---------------+---------+-----------+------------+-----------------+ PFV      Full           Yes      Yes                                      +---------+---------------+---------+-----------+------------+-----------------+ POP      Partial        No       No         softly and  Age Indeterminate                                             brightly                                                                  echogenic                     +---------+---------------+---------+-----------+------------+-----------------+ PTV      Full           Yes      Yes                                      +---------+---------------+---------+-----------+------------+-----------------+ PERO     Full           Yes      Yes                                      +---------+---------------+---------+-----------+------------+-----------------+ Gastroc  Full                                                             +---------+---------------+---------+-----------+------------+-----------------+ GSV      Full           Yes      Yes                                      +---------+---------------+---------+-----------+------------+-----------------+    Findings reported to Dr. Harrell Gave at 9:05 am.  Summary: RIGHT: - No evidence of deep vein thrombosis in the lower extremity. No indirect evidence  of obstruction proximal to the inguinal ligament. - No cystic structure found in the popliteal fossa.  LEFT: - Findings consistent with acute deep vein thrombosis involving the left common femoral vein, SF junction, and left femoral vein. - Findings consistent with age indeterminate deep vein thrombosis involving  the left popliteal vein. - No cystic structure found in the popliteal fossa.  *See table(s) above for measurements and observations.    Preliminary     EKG: Independently reviewed.  Sinus rhythm at 56 bpm  Assessment/Plan DVT of left leg: Acute.  Patient was found to have an acute DVT of the common femoral, SF junction, femoral, and popliteal vein by Doppler ultrasound today.  Patientwas extremely placed on a heparin drip.  Vascular surgery had been consulted for possible placement of IVC filter.  She just had a fall 3 weeks ago and had seizure yesterday.  Patient is still getting up and ambulating with the use of a walker.  Based off her history she would be a very high risk of bleeds.  Low suspicion for possible pulmonary embolus at this time. -Admit to a medical telemetry bed -Strict bedrest -Heparin per pharmacy for now -Appreciate vascular surgery consultative services, will follow-up for further recommendations  Dysuria: Acute.  Patient reports that she has had some burning with urination.  Patient with history of Klebsiella and E. coli urinary tract infections in the past. -Check urinalysis -Will start on antibiotics if concern for urinary tract infection found on urinalysis  Seizure disorder: Patient reportedly had a seizure yesterday.  History of seizure disorder secondary to history of right parietal occipital AVM rupture status post craniotomy in 1989 -Seizure precaution -Check Dilantin level  -Continue home antiepileptic regimen  Paroxysmal atrial fibrillation: Patient currently in sinus rhythm.  CHA2DS2-VASc score =4. Never placed on anticoagulation due to the elevated bleeding risk. -Continue outpatient follow-up with cardiology  Leukopenia: Acute.  WBC 3.4, but patient otherwise noted to be afebrile at this time.  Suspect could possibly be related with urinary tract infection. -Follow-up urinalysis  COPD: Patient without any significant wheezing on physical exam. -Continue  albuterol inhaler as needed  Ambulatory dysfunction with history of frequent: At baseline patient has history of neuropathy with ambulatory dysfunction.  Ambulates with the use of a walker, but reported fallen most recently 3 weeks ago.  DVT prophylaxis: Heparin Code Status: Full Family Communication: To be updated Disposition Plan: Hopefully discharge home once medically stable Consults called: Vascular surgery Admission status: Observation, require for less than 1 midnight stay  Norval Morton MD [facility]   If 7PM-7AM, please contact night-coverage   10/10/2020, 2:19 PM

## 2020-10-10 NOTE — Telephone Encounter (Signed)
Patient's daughter, [name], called to report a blood clot in the left leg found by her cardiologist this morning. She is on the way to [facility] to be admitted today. [name] stated [name] may review the records in Epic and her expertise may be needed.  Patient has to cancel her follow up appointment for this Friday, 3/11/2. She may need to be worked in she gets out of the hospital.

## 2020-10-10 NOTE — ED Triage Notes (Signed)
Patient has confirmed left leg DVT, result of ultrasound from today in Epic. Patient is not currently anticoagulated. Patient alert, oriented, and in no apparent distress at [facility] stime.

## 2020-10-10 NOTE — Patient Instructions (Addendum)
Medication Instructions:  Your Physician recommend you continue on your current medication as directed.    *If you need a refill on your cardiac medications before your next appointment, please call your pharmacy*   Lab Work: None    Testing/Procedures: None   Follow-Up: At [facility], you and your health needs are our priority.  As part of our continuing mission to provide you with exceptional heart care, we have created designated Provider Care Teams.  These Care Teams include your primary Cardiologist (physician) and Advanced Practice Providers (APPs -  Physician Assistants and Nurse Practitioners) who all work together to provide you with the care you need, when you need it.  We recommend signing up for the patient portal called "MyChart".  Sign up information is provided on this After Visit Summary.  MyChart is used to connect with patients for Virtual Visits (Telemedicine).  Patients are able to view lab/test results, encounter notes, upcoming appointments, etc.  Non-urgent messages can be sent to your provider as well.   To learn more about what you can do with MyChart, go to [email or web address].    Your next appointment:   Based on hospital follow up   Other Instructions You have a blood clot in your left leg. Given your history, the safest option is to have you started on a blood thinner in the hospital so that they can make sure you don't have any brain bleeding. They may also talk about placing an IVC filter to prevent the clot from going to your lungs. My recommendation is to go to the ER, where the medicine team can admit you to the hospital and get neurology/neurosurgery's assistance in the treatment plan.

## 2020-10-10 NOTE — Progress Notes (Signed)
ANTICOAGULATION CONSULT NOTE  Pharmacy Consult:  Heparin Indication: DVT  Allergies  Allergen Reactions  . Zonisamide Other (See Comments)    Numbness and tingling over whole body  . Chocolate Flavor Other (See Comments)    Trigger for seizures  . Codeine Nausea And Vomiting  . Keppra [Levetiracetam] Other (See Comments)    Causes seizures  . Pregabalin Nausea And Vomiting  . Olive Oil Rash    Patient Measurements: Height: 5\' 7"  (170.2 cm) Weight: 59 kg (130 lb) IBW/kg (Calculated) : 61.6 Heparin Dosing Weight: 59 kg  Vital Signs: Temp: 98.8 F (37.1 C) (03/09 1621) Temp Source: Oral (03/09 1621) BP: 105/79 (03/09 1621) Pulse Rate: 67 (03/09 1621)  Labs: Recent Labs    10/10/20 1247 10/10/20 1900  HGB 12.9  --   HCT 40.3  --   PLT 168  --   LABPROT 12.9  --   INR 1.0  --   HEPARINUNFRC  --  0.27*  CREATININE 0.78  --     Estimated Creatinine Clearance: 67.9 mL/min (by C-G formula based on SCr of 0.78 mg/dL).   Assessment: 89 YOF presenting from clinic with DVT on ultrasound today in LLE.  Hx of DVT and pAflutter not on anticoagulation PTA d/t high risk for bleeding complications (falls, hx ICH).  Pharmacy consulted to dose IV heparin.  Heparin level is slightly sub-therapeutic; no bleeding reported.  Goal of Therapy:  Heparin level 0.3-0.7 units/ml Monitor platelets by anticoagulation protocol: Yes   Plan:  Increase heparin gtt to 1150 units/hr Check 6 hr heparin level  Harrison Paulson D. Mina Marble, PharmD, BCPS, [place] 10/10/2020, 8:38 PM

## 2020-10-10 NOTE — ED Provider Notes (Signed)
[name]   CSN: [id number] Arrival date & time: 10/10/20  1006     History Chief Complaint  Patient presents with  . DVT    [name] is a 65 y.o. female.  HPI Presents with her daughter who assists with the history. In addition, patient was seen and evaluated at our cardiology clinic earlier in the day, and arrives with paperwork from that visit. Patient has history of multiple medical issues including seizure disorder, prior intracranial hemorrhage, and DVT.  She now presents with pain in the left leg, swelling.  Symptoms have been present for at least several days, possibly several weeks, worsening, sore, diffuse with no distal loss of sensation or function. Today she went to her cardiology office, had outpatient ultrasound.  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 Given the patient's history of ICH, seizures with frequent falls she was sent here for evaluation.  Patient denies chest pain, dyspnea, syncope.  Last seizure was yesterday.     Past Medical History:  Diagnosis Date  . Asthma   . Cerebral hemorrhage ([place]) 1989  . Chronic back pain   . COPD (chronic obstructive pulmonary disease) ([place])   . DDD (degenerative disc disease) 06/12/2013  . DVT (deep venous thrombosis) ([place])    "she's had several since 1990"  . GERD (gastroesophageal reflux disease)   . Headache    "usually around time when she's had a seizure"  . History of blood transfusion    "when she was a baby"  . History of stomach ulcers   . Kidney stones   . Neuropathy   . Seizures ([place])    "because of her brain surgery"  . Stroke [facility]) [phone]   family denies residual on 11/09/2014  . Tunnel vision    "since brain OR"    Patient Active Problem List   Diagnosis Date Noted  . Fall 08/02/2020  . Atrial flutter ([place])   . COVID-19 virus infection 04/02/2020  . Lactic acidosis  04/02/2020  . Ataxia 08/26/2019  . Elevated Dilantin level 08/26/2019  . Closed fracture of proximal phalanx of great toe 01/04/2019  . Closed fracture of fifth metatarsal bone 01/04/2019  . Pain in left foot 01/04/2019  . Altered mental status   . Weakness of both lower extremities   . Generalized weakness 05/25/2018  . History of CVA (cerebrovascular accident)   . Pain   . Chronic back pain   . Neuropathy   . Orthostasis   . Tobacco abuse   . Left-sided weakness 03/19/2018  . Atypical chest pain 02/17/2017  . Localized swelling of lower extremity 02/17/2017  . GERD (gastroesophageal reflux disease) 02/17/2017  . Costochondritis 02/17/2017  . Seizures ([place]) 11/02/2015  . Gait disturbance 11/02/2015  . Right clavicle fracture 11/02/2015  . Urinary tract infection associated with catheterization of urinary tract, initial encounter ([place]) 11/02/2015  . Chest pain 03/31/2015  . Acute respiratory failure with hypoxia ([place]) 03/31/2015  . SOB (shortness of breath) 03/31/2015  . Malnutrition of moderate degree ([place]) 03/31/2015  . COPD with exacerbation ([place]) 03/30/2015  . Hypoxia 01/18/2015  . Chronic obstructive pulmonary disease with acute exacerbation ([place])   . Fever 01/17/2015  . Cough 01/17/2015  . Sepsis secondary to UTI ([place]) 01/17/2015  . COPD (chronic obstructive pulmonary disease) ([place])   . Unintentional weight loss 01/11/2015  . Anxiety 10/22/2014  . Asthma 05/15/2014  . Seizure (  [place]) 05/15/2014  . Protein calorie malnutrition ([place]) 05/15/2014  . Arteriovenous malformation of brain 11/17/2013  . Partial epilepsy with impairment of consciousness, intractable ([place]) 11/17/2013  . 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 DVT of leg (deep venous thrombosis) ([place]) 06/19/2013  . DDD (degenerative disc disease) 06/12/2013  . Paresthesias 06/12/2013  . Tremor 06/09/2013  . Thrombocytopenia- chronic 06/29/2012  . Noncompliance with medication treatment due  to underuse of medication 06/29/2012  . Stress-personal 06/29/2012  . Recurrent seizures ([place]) 06/29/2012  . Tobacco dependence 06/28/2012  . Skull fracture ([place]) 02/28/2012  . Dilantin toxicity 02/28/2012  . Seizure disorder ([place]) 02/28/2012  . History of Intracranial bleed 02/28/2012    Past Surgical History:  Procedure Laterality Date  . [place]   craniotomy with hematoma evacuation  . CESAREAN SECTION  1979; 1987; 1989  . HEMORRHOID SURGERY    . TUBAL LIGATION  1990's     OB History   No obstetric history on file.     Family History  Problem Relation Age of Onset  . Heart attack Mother        4 MIs, started in her 66s, also vaginal cancer and colon cancer  . Cancer Mother   . Heart attack Father        Died suddenly age 47 - autopsy revealed heart attack her patient  . Cancer Brother        Sinus cancer    Social History   Tobacco Use  . Smoking status: Former Smoker    Packs/day: 0.50    Years: 20.00    Pack years: 10.00    Types: Cigarettes    Quit date: 10/26/2017    Years since quitting: 2.9  . Smokeless tobacco: Never Used  . Tobacco comment: Previously smoked 2 ppd, down to 4 cigs/day  Vaping Use  . Vaping Use: Never used  Substance Use Topics  . Alcohol use: No  . Drug use: No    Home Medications Prior to Admission medications   Medication Sig Start Date End Date Taking? Authorizing Provider  acetaminophen (TYLENOL) 500 MG tablet Take 1,000 mg by mouth every 6 (six) hours as needed for mild pain.    [provider]  albuterol (VENTOLIN HFA) 108 (90 Base) MCG/ACT inhaler Inhale 1 puff into the lungs every 4 (four) hours as needed for wheezing or shortness of breath.  03/30/20   [provider]  cholecalciferol (VITAMIN D3) 25 MCG (1000 UNIT) tablet Take 1 tablet (1,000 Units total) by mouth daily. 01/24/20   [name], MD  [employer] B-12 500 MCG tablet TAKE 2 TABLETS BY MOUTH EVERY DAY Patient taking differently: Take  1,000 mcg by mouth daily. 01/24/20   [name], MD  escitalopram (LEXAPRO) 10 MG tablet Take 1 tablet (10 mg total) by mouth daily. 06/06/20   [name], MD  gabapentin (NEURONTIN) 300 MG capsule Take 1 capsule every night Patient taking differently: Take 300 mg by mouth at bedtime. 01/24/20   [name], MD  LORazepam (ATIVAN) 1 MG tablet Take 1 tablet as needed for seizure. Do not take more than 2 in 24 hours. Patient taking differently: Take 1 mg by mouth See admin instructions. Take 1 tablet as needed for seizure. Do not take more than 2 in 24 hours. 06/06/20   [name], MD  phenytoin (DILANTIN) 100 MG ER capsule 100mg  in AM, 200 mg in PM Patient taking differently: 100mg  in  AM, 130 mg in PM 08/12/20   Barb Merino, MD  VIMPAT 200 MG TABS tablet TAKE 1 TABLET BY MOUTH  TWICE DAILY 10/08/20   [name], MD  XCOPRI 100 MG TABS TAKE 1 TABLET BY MOUTH  DAILY 08/29/20   [name], MD    Allergies    Zonisamide, Chocolate flavor, Codeine, Keppra [levetiracetam], Pregabalin, and Olive oil  Review of Systems   Review of Systems  Constitutional:       Per HPI, otherwise negative  HENT:       Per HPI, otherwise negative  Respiratory:       Per HPI, otherwise negative  Cardiovascular:       Per HPI, otherwise negative  Gastrointestinal: Negative for vomiting.  Endocrine:       Negative aside from HPI  Genitourinary:       Neg aside from HPI   Musculoskeletal:       Per HPI, otherwise negative  Skin: Negative.   Neurological: Positive for seizures. Negative for syncope.    Physical Exam Updated Vital Signs BP (!) 109/55   Pulse 65   Temp 98.3 F (36.8 C) (Oral)   Resp 15   Ht 5\' 7"  (1.702 m)   Wt 59 kg   SpO2 96%   BMI 20.36 kg/m   Physical Exam Vitals and nursing [name] reviewed.  Constitutional:      General: She is not in acute distress.    Appearance: She is well-developed and well-nourished.  HENT:     Head: Normocephalic and atraumatic.   Eyes:     Extraocular Movements: EOM normal.     Conjunctiva/sclera: Conjunctivae normal.  Cardiovascular:     Rate and Rhythm: Normal rate and regular rhythm.     Pulses: Normal pulses.          Dorsalis pedis pulses are 2+ on the right side.  Pulmonary:     Effort: Pulmonary effort is normal. No respiratory distress.     Breath sounds: Normal breath sounds. No stridor.  Abdominal:     General: There is no distension.  Musculoskeletal:     Right lower leg: Edema present.  Skin:    General: Skin is warm and dry.  Neurological:     Mental Status: She is alert and oriented to person, place, and time.     Cranial Nerves: No cranial nerve deficit.     Motor: Tremor present.     Comments: Patient follows commands appropriately, has unremarkable cranial nerve exam.  Patient has preserved distal neurovascular status in the right leg.  Psychiatric:        Mood and Affect: Mood is anxious.      ED Results / Procedures / Treatments   Labs (all labs ordered are listed, but only abnormal results are displayed) Labs Reviewed  PROTIME-INR  BASIC METABOLIC PANEL  CBC WITH DIFFERENTIAL/PLATELET  POC SARS CORONAVIRUS 2 AG -  ED    EKG Sinus rhythm, rate 56, artifact, otherwise unremarkable.  Cardiac monitor 50s, sinus, unremarkable Pulse oximetry 98% room air normal  Radiology VAS [place] LOWER EXTREMITY VENOUS (DVT)  Result Date: 10/10/2020  Lower Venous DVT Study Indications: History of DVT. Patient presents today with complaints of bilateral lower extremity pain, left worse than right since falling down 15 stairs on 08/02/2020. She also had a fall in the shower about 3 weeks ago. She denies any SOB.  Risk Factors: DVT history starting in 06/2013. Trauma to the legs after fall  on 08/02/2020 and injury to the head and back after fall in the shower about 3 weeks ago. Comparison Study: NA Performing Technologist: Sharlett Iles RVT  Examination Guidelines: A complete evaluation includes  B-mode imaging, spectral Doppler, color Doppler, and power Doppler as needed of all accessible portions of each vessel. Bilateral testing is considered an integral part of a complete examination. Limited examinations for reoccurring indications may be performed as noted. The reflux portion of the exam is performed with the patient in reverse Trendelenburg.  +---------+---------------+---------+-----------+----------+--------------+ RIGHT    CompressibilityPhasicitySpontaneityPropertiesThrombus Aging +---------+---------------+---------+-----------+----------+--------------+ CFV      Full           Yes      Yes                                 +---------+---------------+---------+-----------+----------+--------------+ SFJ      Full           Yes      Yes                                 +---------+---------------+---------+-----------+----------+--------------+ FV Prox  Full           Yes      Yes                                 +---------+---------------+---------+-----------+----------+--------------+ FV Mid   Full                                                        +---------+---------------+---------+-----------+----------+--------------+ FV DistalFull           Yes      Yes                                 +---------+---------------+---------+-----------+----------+--------------+ PFV      Full           Yes      Yes                                 +---------+---------------+---------+-----------+----------+--------------+ POP      Full           Yes      Yes                                 +---------+---------------+---------+-----------+----------+--------------+ PTV      Full           Yes      Yes                                 +---------+---------------+---------+-----------+----------+--------------+ PERO     Full           Yes      Yes                                  +---------+---------------+---------+-----------+----------+--------------+  Gastroc  Full                                                        +---------+---------------+---------+-----------+----------+--------------+ GSV      Full           Yes      Yes                                 +---------+---------------+---------+-----------+----------+--------------+   +---------+---------------+---------+-----------+------------+-----------------+ LEFT     CompressibilityPhasicitySpontaneityProperties  Thrombus Aging    +---------+---------------+---------+-----------+------------+-----------------+ CFV      Partial        Yes      Yes        softly      Acute                                                         echogenic                     +---------+---------------+---------+-----------+------------+-----------------+ SFJ      Partial        Yes      Yes        softly      Acute                                                         echogenic                     +---------+---------------+---------+-----------+------------+-----------------+ FV Prox  Partial        Yes      No         softly      Acute                                                         echogenic                     +---------+---------------+---------+-----------+------------+-----------------+ FV Mid   None           No       No         softly      Acute                                                         echogenic                     +---------+---------------+---------+-----------+------------+-----------------+ FV DistalNone           No  No         softly      Acute                                                         echogenic                     +---------+---------------+---------+-----------+------------+-----------------+ PFV      Full           Yes      Yes                                       +---------+---------------+---------+-----------+------------+-----------------+ POP      Partial        No       No         softly and  Age Indeterminate                                             brightly                                                                  echogenic                     +---------+---------------+---------+-----------+------------+-----------------+ PTV      Full           Yes      Yes                                      +---------+---------------+---------+-----------+------------+-----------------+ PERO     Full           Yes      Yes                                      +---------+---------------+---------+-----------+------------+-----------------+ Gastroc  Full                                                             +---------+---------------+---------+-----------+------------+-----------------+ GSV      Full           Yes      Yes                                      +---------+---------------+---------+-----------+------------+-----------------+    Findings reported to Dr. Harrell Gave at 9:05 am.  Summary: RIGHT: - No evidence of deep vein thrombosis in the lower extremity. No indirect  evidence of obstruction proximal to the inguinal ligament. - No cystic structure found in the popliteal fossa.  LEFT: - Findings consistent with acute deep vein thrombosis involving the left common femoral vein, SF junction, and left femoral vein. - Findings consistent with age indeterminate deep vein thrombosis involving the left popliteal vein. - No cystic structure found in the popliteal fossa.  *See table(s) above for measurements and observations.    Preliminary     Procedures Procedures   Medications Ordered in ED Medications  acetaminophen (TYLENOL) tablet 1,000 mg (has no administration in time range)    ED Course  I have reviewed the triage vital signs and the nursing notes.  Pertinent labs & imaging results that were  available during my care of the patient were reviewed by me and considered in my medical decision making (see chart for details).   Initial evaluation I discussed patient's presentation with our vascular surgery colleague, Dr. Trula Slade.  With consideration of need for IVC filter, ongoing anticoagulation given her acute DVT, in the context of history of intracranial hemorrhage, the patient will start heparin.  She will be seen evaluate by vascular surgery colleagues, will require admission to our internal medicine colleagues.  MDM Rules/Calculators/A&P MDM Number of Diagnoses or Management Options Acute deep vein thrombosis (DVT) of femoral vein of left lower extremity ([place]): new, needed workup   Amount and/or Complexity of Data Reviewed Clinical lab tests: reviewed Tests in the radiology section of CPT: reviewed Tests in the medicine section of CPT: reviewed Decide to obtain previous medical records or to obtain history from someone other than the patient: yes Obtain history from someone other than the patient: yes Review and summarize past medical records: yes Discuss the patient with other providers: yes Independent visualization of images, tracings, or specimens: yes  Risk of Complications, Morbidity, and/or Mortality Presenting problems: high Diagnostic procedures: high Management options: high  Critical Care Total time providing critical care: 30-74 minutes (35)  Patient Progress Patient progress: stable  Final Clinical Impression(s) / ED Diagnoses Final diagnoses:  Acute deep vein thrombosis (DVT) of femoral vein of left lower extremity ([place])      Carmin Muskrat, MD 10/10/20 1521

## 2020-10-10 NOTE — ED Notes (Signed)
On the phone 

## 2020-10-11 DIAGNOSIS — I480 Paroxysmal atrial fibrillation: Secondary | ICD-10-CM | POA: Diagnosis present

## 2020-10-11 DIAGNOSIS — I82412 Acute embolism and thrombosis of left femoral vein: Secondary | ICD-10-CM | POA: Diagnosis present

## 2020-10-11 DIAGNOSIS — F32A Depression, unspecified: Secondary | ICD-10-CM | POA: Diagnosis present

## 2020-10-11 DIAGNOSIS — Z87891 Personal history of nicotine dependence: Secondary | ICD-10-CM | POA: Diagnosis not present

## 2020-10-11 DIAGNOSIS — Z888 Allergy status to other drugs, medicaments and biological substances status: Secondary | ICD-10-CM | POA: Diagnosis not present

## 2020-10-11 DIAGNOSIS — Z87442 Personal history of urinary calculi: Secondary | ICD-10-CM | POA: Diagnosis not present

## 2020-10-11 DIAGNOSIS — Z885 Allergy status to narcotic agent status: Secondary | ICD-10-CM | POA: Diagnosis not present

## 2020-10-11 DIAGNOSIS — K219 Gastro-esophageal reflux disease without esophagitis: Secondary | ICD-10-CM | POA: Diagnosis present

## 2020-10-11 DIAGNOSIS — G40909 Epilepsy, unspecified, not intractable, without status epilepticus: Secondary | ICD-10-CM | POA: Diagnosis present

## 2020-10-11 DIAGNOSIS — F419 Anxiety disorder, unspecified: Secondary | ICD-10-CM | POA: Diagnosis present

## 2020-10-11 DIAGNOSIS — D72819 Decreased white blood cell count, unspecified: Secondary | ICD-10-CM | POA: Diagnosis present

## 2020-10-11 DIAGNOSIS — G629 Polyneuropathy, unspecified: Secondary | ICD-10-CM | POA: Diagnosis present

## 2020-10-11 DIAGNOSIS — D7589 Other specified diseases of blood and blood-forming organs: Secondary | ICD-10-CM | POA: Diagnosis present

## 2020-10-11 DIAGNOSIS — I82432 Acute embolism and thrombosis of left popliteal vein: Secondary | ICD-10-CM | POA: Diagnosis present

## 2020-10-11 DIAGNOSIS — I82409 Acute embolism and thrombosis of unspecified deep veins of unspecified lower extremity: Secondary | ICD-10-CM | POA: Diagnosis present

## 2020-10-11 DIAGNOSIS — G8929 Other chronic pain: Secondary | ICD-10-CM | POA: Diagnosis present

## 2020-10-11 DIAGNOSIS — Z20822 Contact with and (suspected) exposure to covid-19: Secondary | ICD-10-CM | POA: Diagnosis present

## 2020-10-11 DIAGNOSIS — Z8673 Personal history of transient ischemic attack (TIA), and cerebral infarction without residual deficits: Secondary | ICD-10-CM | POA: Diagnosis not present

## 2020-10-11 DIAGNOSIS — J449 Chronic obstructive pulmonary disease, unspecified: Secondary | ICD-10-CM | POA: Diagnosis present

## 2020-10-11 DIAGNOSIS — Z800 Family history of malignant neoplasm of digestive organs: Secondary | ICD-10-CM | POA: Diagnosis not present

## 2020-10-11 DIAGNOSIS — I82402 Acute embolism and thrombosis of unspecified deep veins of left lower extremity: Secondary | ICD-10-CM | POA: Diagnosis not present

## 2020-10-11 DIAGNOSIS — I824Z2 Acute embolism and thrombosis of unspecified deep veins of left distal lower extremity: Secondary | ICD-10-CM | POA: Diagnosis not present

## 2020-10-11 DIAGNOSIS — R300 Dysuria: Secondary | ICD-10-CM | POA: Diagnosis present

## 2020-10-11 DIAGNOSIS — R296 Repeated falls: Secondary | ICD-10-CM | POA: Diagnosis not present

## 2020-10-11 DIAGNOSIS — M549 Dorsalgia, unspecified: Secondary | ICD-10-CM | POA: Diagnosis present

## 2020-10-11 DIAGNOSIS — D696 Thrombocytopenia, unspecified: Secondary | ICD-10-CM | POA: Diagnosis present

## 2020-10-11 DIAGNOSIS — Z8249 Family history of ischemic heart disease and other diseases of the circulatory system: Secondary | ICD-10-CM | POA: Diagnosis not present

## 2020-10-11 LAB — CBC
HCT: 39.6 % (ref 36.0–46.0)
Hemoglobin: 13.1 g/dL (ref 12.0–15.0)
MCH: 34.7 pg — ABNORMAL HIGH (ref 26.0–34.0)
MCHC: 33.1 g/dL (ref 30.0–36.0)
MCV: 105 fL — ABNORMAL HIGH (ref 80.0–100.0)
Platelets: 145 10*3/uL — ABNORMAL LOW (ref 150–400)
RBC: 3.77 MIL/uL — ABNORMAL LOW (ref 3.87–5.11)
RDW: 14.1 % (ref 11.5–15.5)
WBC: 3.4 10*3/uL — ABNORMAL LOW (ref 4.0–10.5)
nRBC: 0 % (ref 0.0–0.2)

## 2020-10-11 LAB — HEPARIN LEVEL (UNFRACTIONATED)
Heparin Unfractionated: 0.53 IU/mL (ref 0.30–0.70)
Heparin Unfractionated: 0.75 IU/mL — ABNORMAL HIGH (ref 0.30–0.70)
Heparin Unfractionated: 0.71 IU/mL — ABNORMAL HIGH (ref 0.30–0.70)

## 2020-10-11 MED ORDER — SODIUM CHLORIDE 0.9 % IV SOLN: INTRAVENOUS | Status: DC

## 2020-10-11 NOTE — ED Notes (Signed)
[place], MD paged and reached out to this RN in regards to pt hypotension, provider made aware pt A&Ox4 and arousalable with verbal and tactile stimuli. Pt denies SOB and co of only pain to R and L anterior foot.

## 2020-10-11 NOTE — Progress Notes (Signed)
ANTICOAGULATION CONSULT NOTE  Pharmacy Consult:  Heparin Indication: DVT  Allergies  Allergen Reactions  . Zonisamide Other (See Comments)    Numbness and tingling over whole body  . Chocolate Flavor Other (See Comments)    Trigger for seizures  . Codeine Nausea And Vomiting  . Keppra [Levetiracetam] Other (See Comments)    Causes seizures  . Pregabalin Nausea And Vomiting  . Olive Oil Rash    Patient Measurements: Height: 5\' 7"  (170.2 cm) Weight: 60.6 kg (133 lb 9.6 oz) IBW/kg (Calculated) : 61.6 Heparin Dosing Weight: 59 kg  Vital Signs: Temp: 98.3 F (36.8 C) (03/10 0522) Temp Source: Oral (03/10 0522) BP: 117/59 (03/10 0522) Pulse Rate: 62 (03/10 0522)  Labs: Recent Labs    10/10/20 1247 10/10/20 1900 10/11/20 0540  HGB 12.9  --  13.1  HCT 40.3  --  39.6  PLT 168  --  145*  LABPROT 12.9  --   --   INR 1.0  --   --   HEPARINUNFRC  --  0.27* 0.53  CREATININE 0.78  --   --     Estimated Creatinine Clearance: 69.8 mL/min (by C-G formula based on SCr of 0.78 mg/dL).   Assessment: [name] presenting from clinic with DVT on ultrasound today in LLE.  Hx of DVT and pAflutter not on anticoagulation PTA d/t high risk for bleeding complications (falls, hx ICH).  Pharmacy consulted to dose IV heparin.  Heparin level is slightly sub-therapeutic; no bleeding reported.  3/10 AM update:  Heparin level therapeutic after rate increase  Goal of Therapy:  Heparin level 0.3-0.7 units/ml Monitor platelets by anticoagulation protocol: Yes   Plan:  Cont heparin 1150 units/hr 1200 heparin level  Narda Bonds, PharmD, BCPS Clinical Pharmacist Phone: [phone]

## 2020-10-11 NOTE — H&P (View-Only) (Signed)
IVC filter cancelled for today due to scheduling issues.  Patient will be placed on the schedule for tomorrow   [name]

## 2020-10-11 NOTE — Progress Notes (Signed)
[place] for heparin Indication: DVT  Allergies  Allergen Reactions  . Zonisamide Other (See Comments)    Numbness and tingling over whole body  . Chocolate Flavor Other (See Comments)    Trigger for seizures  . Codeine Nausea And Vomiting  . Keppra [Levetiracetam] Other (See Comments)    Causes seizures  . Pregabalin Nausea And Vomiting  . Olive Oil Rash    Patient Measurements: Height: 5\' 7"  (170.2 cm) Weight: 60.6 kg (133 lb 9.6 oz) IBW/kg (Calculated) : 61.6 Heparin Dosing Weight: TBW  Vital Signs: Temp: 97.9 F (36.6 C) (03/10 1938) Temp Source: Oral (03/10 1938) BP: 111/45 (03/10 1938) Pulse Rate: 70 (03/10 1938)  Labs: Recent Labs    10/10/20 1247 10/10/20 1900 10/11/20 0540 10/11/20 1121 10/11/20 2023  HGB 12.9  --  13.1  --   --   HCT 40.3  --  39.6  --   --   PLT 168  --  145*  --   --   LABPROT 12.9  --   --   --   --   INR 1.0  --   --   --   --   HEPARINUNFRC  --    < > 0.53 0.75* 0.71*  CREATININE 0.78  --   --   --   --    < > = values in this interval not displayed.    Estimated Creatinine Clearance: 69.8 mL/min (by C-G formula based on SCr of 0.78 mg/dL).   Assessment: 64 YOF presenting from clinic with DVT on ultrasound in LLE.  Hx of DVT and pAflutter not on anticoagulation PTA d/t high risk for bleeding complications (falls, hx ICH).    Heparin level slightly above goal at 0.71 units/mL.  Lab drawn distal to the IV site.  No bleeding reported.  Goal of Therapy:  Heparin level 0.3-0.7 units/ml Monitor platelets by anticoagulation protocol: Yes   Plan:  Reduce Heparin to 950 units/hr F/U AM labs  Aaban Griep D. Mina Marble, PharmD, BCPS, [place] 10/11/2020, 9:15 PM

## 2020-10-11 NOTE — Progress Notes (Signed)
[place] for heparin Indication: DVT  Allergies  Allergen Reactions  . Zonisamide Other (See Comments)    Numbness and tingling over whole body  . Chocolate Flavor Other (See Comments)    Trigger for seizures  . Codeine Nausea And Vomiting  . Keppra [Levetiracetam] Other (See Comments)    Causes seizures  . Pregabalin Nausea And Vomiting  . Olive Oil Rash    Patient Measurements: Height: 5\' 7"  (170.2 cm) Weight: 60.6 kg (133 lb 9.6 oz) IBW/kg (Calculated) : 61.6 Heparin Dosing Weight: TBW  Vital Signs: Temp: 98.1 F (36.7 C) (03/10 0809) Temp Source: Oral (03/10 0809) BP: 92/49 (03/10 0809) Pulse Rate: 55 (03/10 0809)  Labs: Recent Labs    10/10/20 1247 10/10/20 1900 10/11/20 0540  HGB 12.9  --  13.1  HCT 40.3  --  39.6  PLT 168  --  145*  LABPROT 12.9  --   --   INR 1.0  --   --   HEPARINUNFRC  --  0.27* 0.53  CREATININE 0.78  --   --     Estimated Creatinine Clearance: 69.8 mL/min (by C-G formula based on SCr of 0.78 mg/dL).   Medical History: Past Medical History:  Diagnosis Date  . Asthma   . Cerebral hemorrhage ([place]) 1989  . Chronic back pain   . COPD (chronic obstructive pulmonary disease) ([place])   . DDD (degenerative disc disease) 06/12/2013  . DVT (deep venous thrombosis) ([place])    "she's had several since 1990"  . GERD (gastroesophageal reflux disease)   . Headache    "usually around time when she's had a seizure"  . History of blood transfusion    "when she was a baby"  . History of stomach ulcers   . Kidney stones   . Neuropathy   . Seizures ([place])    "because of her brain surgery"  . Stroke [facility]) [phone]   family denies residual on 11/09/2014  . Tunnel vision    "since brain OR"   Assessment: 54 YOF presenting from clinic with DVT on ultrasound today in LLE.  Hx of DVT and pAflutter not on anticoagulation PTA d/t high risk for bleeding complications (falls, hx ICH).    Heparin level this afternoon  is above goal at 0.75 on 1150 units/hr. No bleeding issues noted. CBC was stable overnight.  Goal of Therapy:  Heparin level 0.3-0.7 units/ml Monitor platelets by anticoagulation protocol: Yes   Plan:  Reduce Heparin to 1050 units/hr F/u 6 hour heparin level to confirm F/u [facility] plan  Erin Hearing PharmD., BCPS Clinical Pharmacist 10/11/2020 8:37 AM

## 2020-10-11 NOTE — Progress Notes (Signed)
IVC filter cancelled for today due to scheduling issues.  Patient will be placed on the schedule for tomorrow   [name]

## 2020-10-11 NOTE — Progress Notes (Signed)
Patient ID: [name], female   DOB: [birth year], 65 y.o.   MRN: [id number]  PROGRESS NOTE    [name]  TLX:[id number] DOB: 04-Aug-1956 DOA: 10/10/2020 PCP: [name], MD   Brief Narrative:  65 y.o. 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  She was found to have acute DVT involving left common femoral vein, SF junction, and left femoral vein along with an indeterminate deep vein thrombosis involving the left popliteal vein was also seen on duplex ultrasound done as an outpatient.  She was subsequently admitted and started on IV heparin.  Vascular surgery was consulted for possible IVC filter placement.  Assessment & Plan:   Acute left leg DVT - found to have acute DVT involving left common femoral vein, SF junction, and left femoral vein along with an indeterminate deep vein thrombosis involving the left popliteal vein was also seen on duplex ultrasound done as an outpatient -Continue heparin drip. -Vascular surgery following and planning for IVC filter placement at some point.  Patient is a poor candidate for oral anticoagulation given frequent falls and history of seizures  Dysuria -Urinalysis does not suggest UTI although UA shows WBCs of 5-10.  Patient is afebrile with no leukocytosis.  Will hold off on starting antibiotics  Thrombocytopenia -No signs of bleeding.  Monitor  Leukopenia -Questionable cause.  Monitor  Macrocytosis -Check R41, TSH and folic acid levels in a.m.  Paroxysmal A. Fib -Currently rate controlled.  Outpatient follow-up with cardiology.  Current evaluation due to elevated bleeding risk  Seizure disorder -Patient reportedly had a seizure 1 day prior to presentation -History of seizure disorder secondary to history of right parietal  occipital AVM rupture status post craniotomy in 1989 -Seizure precaution.  Continue home regimen.  Outpatient follow-up with neurology  COPD  -currently stable.  Continue albuterol inhaler as needed  Ambulatory dysfunction with frequent falls -PT eval   DVT prophylaxis: Heparin drip Code Status: Full Family Communication: None at bedside Disposition Plan: Status is: Observation  The patient will require care spanning > 2 midnights and should be moved to inpatient because: Inpatient level of care appropriate due to severity of illness  Dispo: The patient is from: Home              Anticipated d/c is to: Home              Patient currently is not medically stable to d/c.   Difficult to place patient No  Consultants: Vascular surgery  Procedures: None  Antimicrobials: None   Subjective: Patient seen and examined at bedside.  Denies worsening chest pain, shortness of breath, abdominal pain, nausea, vomiting or fever.  Objective: Vitals:   10/10/20 2105 10/11/20 0145 10/11/20 0522 10/11/20 0809  BP: (!) 95/52 (!) 97/52 (!) 117/59 (!) 92/49  Pulse: 66 63 62 (!) 55  Resp: (!) 23 18 18 18   Temp:   98.3 F (36.8 C) 98.1 F (36.7 C)  TempSrc:   Oral Oral  SpO2: 93% 92% 95% 93%  Weight:   60.6 kg   Height:   5\' 7"  (1.702 m)    No intake or output data in the 24 hours ending 10/11/20 1028 Filed Weights   10/10/20 1020 10/11/20 0522  Weight: 59 kg 60.6 kg    Examination:  General exam: Appears calm and comfortable.  Currently  on room air Respiratory system: Bilateral decreased breath sounds at bases Cardiovascular system: S1 & S2 heard, intermittently bradycardic Gastrointestinal system: Abdomen is nondistended, soft and nontender. Normal bowel sounds heard. Extremities: No cyanosis, clubbing; mild lower extremity edema Central nervous system: Alert and oriented. No focal neurological deficits. Moving extremities Skin: No rashes, lesions or ulcers Psychiatry: Flat  affect    Data Reviewed: I have personally reviewed following labs and imaging studies  CBC: Recent Labs  Lab 10/10/20 1247 10/11/20 0540  WBC 3.4* 3.4*  NEUTROABS 1.7  --   HGB 12.9 13.1  HCT 40.3 39.6  MCV 107.8* 105.0*  PLT 168 785*   Basic Metabolic Panel: Recent Labs  Lab 10/10/20 1247  NA 141  K 3.8  CL 105  CO2 27  GLUCOSE 96  BUN 7*  CREATININE 0.78  CALCIUM 9.3   GFR: Estimated Creatinine Clearance: 69.8 mL/min (by C-G formula based on SCr of 0.78 mg/dL). Liver Function Tests: Recent Labs  Lab 10/10/20 0935  AST 15  ALT 7  ALKPHOS 92  BILITOT 0.2  PROT 6.8  ALBUMIN 4.6   No results for input(s): LIPASE, AMYLASE in the last 168 hours. No results for input(s): AMMONIA in the last 168 hours. Coagulation Profile: Recent Labs  Lab 10/10/20 1247  INR 1.0   Cardiac Enzymes: No results for input(s): CKTOTAL, CKMB, CKMBINDEX, TROPONINI in the last 168 hours. BNP (last 3 results) No results for input(s): PROBNP in the last 8760 hours. HbA1C: No results for input(s): HGBA1C in the last 72 hours. CBG: No results for input(s): GLUCAP in the last 168 hours. Lipid Profile: Recent Labs    10/10/20 0934  CHOL 219*  HDL 89  LDLCALC 112*  TRIG 106  CHOLHDL 2.5   Thyroid Function Tests: No results for input(s): TSH, T4TOTAL, FREET4, T3FREE, THYROIDAB in the last 72 hours. Anemia Panel: No results for input(s): VITAMINB12, FOLATE, FERRITIN, TIBC, IRON, RETICCTPCT in the last 72 hours. Sepsis Labs: No results for input(s): PROCALCITON, LATICACIDVEN in the last 168 hours.  Recent Results (from the past 240 hour(s))  SARS CORONAVIRUS 2 (TAT 6-24 HRS) Nasopharyngeal Nasopharyngeal Swab     Status: None   Collection Time: 10/10/20 12:50 PM   Specimen: Nasopharyngeal Swab  Result Value Ref Range Status   SARS Coronavirus 2 NEGATIVE NEGATIVE Final    Comment: (NOTE) SARS-CoV-2 target nucleic acids are NOT DETECTED.  The SARS-CoV-2 RNA is generally  detectable in upper and lower respiratory specimens during the acute phase of infection. Negative results do not preclude SARS-CoV-2 infection, do not rule out co-infections with other pathogens, and should not be used as the sole basis for treatment or other patient management decisions. Negative results must be combined with clinical observations, patient history, and epidemiological information. The expected result is Negative.  Fact Sheet for Patients: SugarRoll.be  Fact Sheet for Healthcare Providers: https://www.woods-mathews.com/  This test is not yet approved or cleared by the [place] FDA and  has been authorized for detection and/or diagnosis of SARS-CoV-2 by FDA under an Emergency Use Authorization (EUA). This EUA will remain  in effect (meaning this test can be used) for the duration of the COVID-19 declaration under Se ction 564(b)(1) of the Act, 21 U.S.C. section 360bbb-3(b)(1), unless the authorization is terminated or revoked sooner.  Performed at [facility], [place] [address], [place], [place] [address]          Radiology Studies: VAS [place] LOWER EXTREMITY VENOUS (DVT)  Result Date:  10/10/2020  Lower Venous DVT Study Indications: History of DVT. Patient presents today with complaints of bilateral lower extremity pain, left worse than right since falling down 15 stairs on 08/02/2020. She also had a fall in the shower about 3 weeks ago. She denies any SOB.  Risk Factors: DVT history starting in 06/2013. Trauma to the legs after fall on 08/02/2020 and injury to the head and back after fall in the shower about 3 weeks ago. Comparison Study: NA Performing Technologist: Sharlett Iles RVT  Examination Guidelines: A complete evaluation includes B-mode imaging, spectral Doppler, color Doppler, and power Doppler as needed of all accessible portions of each vessel. Bilateral testing is considered an integral part of a complete  examination. Limited examinations for reoccurring indications may be performed as noted. The reflux portion of the exam is performed with the patient in reverse Trendelenburg.  +---------+---------------+---------+-----------+----------+--------------+ RIGHT    CompressibilityPhasicitySpontaneityPropertiesThrombus Aging +---------+---------------+---------+-----------+----------+--------------+ CFV      Full           Yes      Yes                                 +---------+---------------+---------+-----------+----------+--------------+ SFJ      Full           Yes      Yes                                 +---------+---------------+---------+-----------+----------+--------------+ FV Prox  Full           Yes      Yes                                 +---------+---------------+---------+-----------+----------+--------------+ FV Mid   Full                                                        +---------+---------------+---------+-----------+----------+--------------+ FV DistalFull           Yes      Yes                                 +---------+---------------+---------+-----------+----------+--------------+ PFV      Full           Yes      Yes                                 +---------+---------------+---------+-----------+----------+--------------+ POP      Full           Yes      Yes                                 +---------+---------------+---------+-----------+----------+--------------+ PTV      Full           Yes      Yes                                 +---------+---------------+---------+-----------+----------+--------------+  PERO     Full           Yes      Yes                                 +---------+---------------+---------+-----------+----------+--------------+ Gastroc  Full                                                        +---------+---------------+---------+-----------+----------+--------------+ GSV      Full            Yes      Yes                                 +---------+---------------+---------+-----------+----------+--------------+   +---------+---------------+---------+-----------+------------+-----------------+ LEFT     CompressibilityPhasicitySpontaneityProperties  Thrombus Aging    +---------+---------------+---------+-----------+------------+-----------------+ CFV      Partial        Yes      Yes        softly      Acute                                                         echogenic                     +---------+---------------+---------+-----------+------------+-----------------+ SFJ      Partial        Yes      Yes        softly      Acute                                                         echogenic                     +---------+---------------+---------+-----------+------------+-----------------+ FV Prox  Partial        Yes      No         softly      Acute                                                         echogenic                     +---------+---------------+---------+-----------+------------+-----------------+ FV Mid   None           No       No         softly      Acute  echogenic                     +---------+---------------+---------+-----------+------------+-----------------+ FV DistalNone           No       No         softly      Acute                                                         echogenic                     +---------+---------------+---------+-----------+------------+-----------------+ PFV      Full           Yes      Yes                                      +---------+---------------+---------+-----------+------------+-----------------+ POP      Partial        No       No         softly and  Age Indeterminate                                             brightly                                                                   echogenic                     +---------+---------------+---------+-----------+------------+-----------------+ PTV      Full           Yes      Yes                                      +---------+---------------+---------+-----------+------------+-----------------+ PERO     Full           Yes      Yes                                      +---------+---------------+---------+-----------+------------+-----------------+ Gastroc  Full                                                             +---------+---------------+---------+-----------+------------+-----------------+ GSV      Full           Yes      Yes                                      +---------+---------------+---------+-----------+------------+-----------------+  Findings reported to Dr. Harrell Gave at 9:05 am.  Summary: RIGHT: - No evidence of deep vein thrombosis in the lower extremity. No indirect evidence of obstruction proximal to the inguinal ligament. - No cystic structure found in the popliteal fossa.  LEFT: - Findings consistent with acute deep vein thrombosis involving the left common femoral vein, SF junction, and left femoral vein. - Findings consistent with age indeterminate deep vein thrombosis involving the left popliteal vein. - No cystic structure found in the popliteal fossa.  *See table(s) above for measurements and observations. Electronically signed by Quay Burow MD on 10/10/2020 at 2:35:56 PM.    Final         Scheduled Meds: . Cenobamate  100 mg Oral q AM  . cholecalciferol  1,000 Units Oral Daily  . escitalopram  10 mg Oral QHS  . gabapentin  300 mg Oral QHS  . lacosamide  200 mg Oral BID  . phenytoin  100 mg Oral BID  . phenytoin  30 mg Oral QHS   Continuous Infusions: . heparin 1,150 Units/hr (10/10/20 2104)          Aline August, MD [facility] 10/11/2020, 10:28 AM

## 2020-10-12 ENCOUNTER — Ambulatory Visit: Payer: Medicare Other | Admitting: Neurology

## 2020-10-12 ENCOUNTER — Encounter (HOSPITAL_COMMUNITY)
Admission: EM | Disposition: A | Discharge status: Home / Self Care | Payer: Self-pay | Source: Home / Self Care | Attending: Internal Medicine

## 2020-10-12 ENCOUNTER — Encounter (HOSPITAL_COMMUNITY): Payer: Self-pay | Admitting: Vascular Surgery

## 2020-10-12 DIAGNOSIS — J449 Chronic obstructive pulmonary disease, unspecified: Secondary | ICD-10-CM | POA: Diagnosis not present

## 2020-10-12 DIAGNOSIS — D72819 Decreased white blood cell count, unspecified: Secondary | ICD-10-CM | POA: Diagnosis not present

## 2020-10-12 DIAGNOSIS — R300 Dysuria: Secondary | ICD-10-CM | POA: Diagnosis not present

## 2020-10-12 DIAGNOSIS — I824Z2 Acute embolism and thrombosis of unspecified deep veins of left distal lower extremity: Secondary | ICD-10-CM | POA: Diagnosis not present

## 2020-10-12 HISTORY — PX: IVC FILTER INSERTION: CATH118245

## 2020-10-12 LAB — PHENYTOIN LEVEL, FREE AND TOTAL: Phenytoin, Total: 14.4 ug/mL (ref 10.0–20.0)

## 2020-10-12 LAB — CBC
HCT: 38.4 % (ref 36.0–46.0)
HCT: 40.5 % (ref 36.0–46.0)
Hemoglobin: 12.4 g/dL (ref 12.0–15.0)
Hemoglobin: 13 g/dL (ref 12.0–15.0)
MCH: 34.3 pg — ABNORMAL HIGH (ref 26.0–34.0)
MCH: 33.9 pg (ref 26.0–34.0)
MCHC: 32.3 g/dL (ref 30.0–36.0)
MCHC: 32.1 g/dL (ref 30.0–36.0)
MCV: 106.4 fL — ABNORMAL HIGH (ref 80.0–100.0)
MCV: 105.7 fL — ABNORMAL HIGH (ref 80.0–100.0)
Platelets: 145 10*3/uL — ABNORMAL LOW (ref 150–400)
Platelets: 133 10*3/uL — ABNORMAL LOW (ref 150–400)
RBC: 3.61 MIL/uL — ABNORMAL LOW (ref 3.87–5.11)
RBC: 3.83 MIL/uL — ABNORMAL LOW (ref 3.87–5.11)
RDW: 13.9 % (ref 11.5–15.5)
RDW: 14 % (ref 11.5–15.5)
WBC: 3.6 10*3/uL — ABNORMAL LOW (ref 4.0–10.5)
WBC: 3.1 10*3/uL — ABNORMAL LOW (ref 4.0–10.5)
nRBC: 0 % (ref 0.0–0.2)
nRBC: 0 % (ref 0.0–0.2)

## 2020-10-12 LAB — BASIC METABOLIC PANEL
Anion gap: 5 (ref 5–15)
BUN: 13 mg/dL (ref 8–23)
CO2: 29 mmol/L (ref 22–32)
Calcium: 9.1 mg/dL (ref 8.9–10.3)
Chloride: 108 mmol/L (ref 98–111)
Creatinine, Ser: 0.83 mg/dL (ref 0.44–1.00)
GFR, Estimated: >60 mL/min (ref >60)
Glucose, Bld: 88 mg/dL (ref 70–99)
Potassium: 3.8 mmol/L (ref 3.5–5.1)
Sodium: 142 mmol/L (ref 135–145)

## 2020-10-12 LAB — MAGNESIUM: Magnesium: 2.1 mg/dL (ref 1.7–2.4)

## 2020-10-12 LAB — HEPARIN LEVEL (UNFRACTIONATED)
Heparin Unfractionated: 0.67 IU/mL (ref 0.30–0.70)
Heparin Unfractionated: 0.16 IU/mL — ABNORMAL LOW (ref 0.30–0.70)

## 2020-10-12 SURGERY — IVC FILTER INSERTION
Anesthesia: LOCAL

## 2020-10-12 MED ORDER — ESCITALOPRAM OXALATE 10 MG PO TABS: 10.0000 mg | ORAL_TABLET | Freq: Every day | ORAL | Status: DC

## 2020-10-12 MED ORDER — LORAZEPAM 1 MG PO TABS: 1.0000 mg | ORAL_TABLET | ORAL | Status: DC

## 2020-10-12 MED ORDER — IODIXANOL 320 MG/ML IV SOLN
INTRAVENOUS | Status: DC | PRN
  Administered 2020-10-12: 30 mL via INTRAVENOUS

## 2020-10-12 MED ORDER — MIDAZOLAM HCL 2 MG/2ML IJ SOLN
INTRAMUSCULAR | Status: AC
  Filled 2020-10-12: qty 2

## 2020-10-12 MED ORDER — ALUM & MAG HYDROXIDE-SIMETH 200-200-20 MG/5ML PO SUSP
30.0000 mL | Freq: Four times a day (QID) | ORAL | Status: DC | PRN
  Administered 2020-10-12 (×2): 30 mL via ORAL
  Filled 2020-10-12 (×2): qty 30

## 2020-10-12 MED ORDER — HEPARIN (PORCINE) IN NACL 1000-0.9 UT/500ML-% IV SOLN
INTRAVENOUS | Status: AC
  Filled 2020-10-12: qty 500

## 2020-10-12 MED ORDER — LIDOCAINE HCL (PF) 1 % IJ SOLN
INTRAMUSCULAR | Status: AC
  Filled 2020-10-12: qty 30

## 2020-10-12 MED ORDER — LIDOCAINE HCL (PF) 1 % IJ SOLN
INTRAMUSCULAR | Status: DC | PRN
  Administered 2020-10-12: 15 mL via INTRADERMAL

## 2020-10-12 MED ORDER — FENTANYL CITRATE (PF) 100 MCG/2ML IJ SOLN
INTRAMUSCULAR | Status: DC | PRN
  Administered 2020-10-12: 25 ug via INTRAVENOUS

## 2020-10-12 MED ORDER — MIDAZOLAM HCL 2 MG/2ML IJ SOLN
INTRAMUSCULAR | Status: DC | PRN
  Administered 2020-10-12: 0.5 mg via INTRAVENOUS

## 2020-10-12 MED ORDER — HEPARIN (PORCINE) IN NACL 1000-0.9 UT/500ML-% IV SOLN
INTRAVENOUS | Status: DC | PRN
  Administered 2020-10-12: 500 mL

## 2020-10-12 MED ORDER — FENTANYL CITRATE (PF) 100 MCG/2ML IJ SOLN
INTRAMUSCULAR | Status: AC
  Filled 2020-10-12: qty 2

## 2020-10-12 SURGICAL SUPPLY — 10 items
BAG SNAP BAND KOVER 36X36 (MISCELLANEOUS) ×1 IMPLANT
COVER DOME SNAP 22 D (MISCELLANEOUS) ×1 IMPLANT
FILTER VC CELECT-FEMORAL (Filter) ×1 IMPLANT
KIT MICROPUNCTURE NIT STIFF (SHEATH) ×1 IMPLANT
PROTECTION STATION PRESSURIZED (MISCELLANEOUS) ×2
STATION PROTECTION PRESSURIZED (MISCELLANEOUS) IMPLANT
SYR MEDRAD MARK 7 150ML (SYRINGE) ×2 IMPLANT
TRANSDUCER W/STOPCOCK (MISCELLANEOUS) ×2 IMPLANT
TRAY PV CATH (CUSTOM PROCEDURE TRAY) ×2 IMPLANT
WIRE BENTSON .035X145CM (WIRE) ×2 IMPLANT

## 2020-10-12 NOTE — Op Note (Signed)
   PATIENT: ALICHA RASPBERRY      MRN: [id number] DOB: [birth year]    DATE OF PROCEDURE: 10/12/2020  INDICATIONS:    NOELANI HARBACH is a 65 y.o. female who had a left lower extremity DVT.  She had a contraindication to heparin because of frequent falls.  An IVC filter was recommended.  PROCEDURE:    1.  Ultrasound-guided access to the right common femoral vein 2.  Cavogram 3.  Placement of Cook Celect Femoral IVC filter 4.  Conscious sedation  SURGEON: Judeth Cornfield. Scot Dock, MD, FACS  ANESTHESIA: Local with sedation  EBL: Minimal  TECHNIQUE: The patient was brought to the peripheral vascular lab and was sedated. The period of conscious sedation was 32 minutes.  During that time period, I was present face-to-face 100% of the time.  The patient was administered half milligram of Versed and 50 mcg of fentanyl. The patient's heart rate, blood pressure, and oxygen saturation were monitored by the nurse continuously during the procedure.  The right groin was prepped and draped in the usual sterile fashion.  Under ultrasound guidance, after the skin was anesthetized, I cannulated the right common femoral vein with a micropuncture needle and a micropuncture sheath was introduced over a wire.  There was no clot in the right common femoral vein.  A real-time image was obtained and sent to the server.  Under fluoroscopic control I confirmed position of the wire and placed a micropuncture sheath.  A Bentson wire was advanced into the inferior vena cava and the tract over the wire dilated.  I then placed the coaxial sheath over the wire and positioned the tip of the dilator at approximately the L3 vertebral body.  A cavogram was obtained with the patient Valsalva limb to allow excellent demonstration of the position of the renal veins and the bifurcation.  I then advanced the wire through the coaxial system and positioned the tip of the sheath at the top of the L3 vertebral body below the renal veins.  The  wire and dilator were removed.  The femoral IVC filter was then passed through the sheath until the level of the tactile bump.  The sheath was then retracted and the filter positioned at approximately L3 vertebral body just below the takeoff of the renal veins.  This was then pulled without difficulty.  The sheath was removed and pressure held for hemostasis.  No immediate complications were noted.  FINDINGS:   Successful placement of IVC filter at the L3 vertebral body below the renal veins.   Deitra Mayo, MD, FACS [facility]  DATE OF DICTATION:   10/12/2020

## 2020-10-12 NOTE — Evaluation (Signed)
Physical Therapy Evaluation Patient Details Name: KYLEA BERRONG MRN: [id number] DOB: 1955-11-09 Today's Date: 10/12/2020   History of Present Illness  pt is a 65 y/o female admitted 3/9 with complaints of left Leg swelling for 2 weeks.  pt found to have an acute DVT of the common femoral, SF junction, femoral and popliteal veins by doppler ultrasound.  She had a fall 3 weeks ago and had a seizure day prior ot admission.  Pt s/p IVC filter 3/11 due to risks of using anticoagulants.  PMHx: seizure d/o, Pafib, COPD, stroke, neuropathy, DDD, brain sx/crani for hematoma evacuation  Clinical Impression  Pt admitted with/for L LE pain due to DVT, s/p IVC filter, extreme post op pain then severely limited mobility, pt needing max assist for limited mobility.  Pt currently limited functionally due to the problems listed below.  (see problems list.)  Pt will benefit from PT to maximize function and safety to be able to get home safely with available assist.     Follow Up Recommendations Home health PT;Other (comment);Supervision/Assistance - 24 hour    Equipment Recommendations  Wheelchair (measurements PT);Wheelchair cushion (measurements PT) (if pain persists and pt can't ambulate)    Recommendations for Other Services       Precautions / Restrictions Precautions Precautions: Fall      Mobility  Bed Mobility Overal bed mobility: Needs Assistance Bed Mobility: Supine to Sit;Sit to Supine     Supine to sit: Max assist Sit to supine: Max assist   General bed mobility comments: assist of LE's and trunk up to sitting EOB due to R groin incision worse than L LE pain.    Transfers Overall transfer level: Needs assistance               General transfer comment: unable  Ambulation/Gait             General Gait Details: unable  Stairs            Wheelchair Mobility    Modified Rankin (Stroke Patients Only)       Balance Overall balance assessment: Needs  assistance Sitting-balance support: Bilateral upper extremity supported;Feet unsupported Sitting balance-Leahy Scale: Poor Sitting balance - Comments: unable to hold balance without UE assist due to pain/.                                     Pertinent Vitals/Pain Pain Assessment: Faces Faces Pain Scale: Hurts whole lot Pain Location: R groin, L LEG Pain Descriptors / Indicators: Burning;Throbbing;Grimacing;Moaning Pain Intervention(s): Monitored during session;Limited activity within patient's tolerance;Patient requesting pain meds-RN notified    Home Living Family/patient expects to be discharged to:: Private residence Living Arrangements: Children Available Help at Discharge: Family;Available 24 hours/day Type of Home: House Home Access: Level entry     Home Layout: Two level;1/2 bath on main level Home Equipment: Walker - 2 wheels;Other (comment) (STAIR LIFT) Additional Comments: Pt reports she lives with her daughter and her daughter's family    Prior Function           Comments: has RW on both levels     Hand Dominance   Dominant Hand: Right    Extremity/Trunk Assessment   Upper Extremity Assessment Upper Extremity Assessment: Overall WFL for tasks assessed    Lower Extremity Assessment Lower Extremity Assessment: RLE deficits/detail;LLE deficits/detail RLE Deficits / Details: pain limiting assessment.  pt moving leg spontaneously, but can  not lift against gravity RLE: Unable to fully assess due to pain RLE Sensation: history of peripheral neuropathy;decreased light touch LLE Deficits / Details: little spontaneous movement due to pain.  cannot lift against gravity.  Able to slide toward EOB with assist LLE Sensation: decreased light touch;history of peripheral neuropathy LLE Coordination: decreased gross motor;decreased fine motor       Communication   Communication: No difficulties  Cognition Arousal/Alertness: Awake/alert Behavior  During Therapy: Anxious;Restless Overall Cognitive Status: Within Functional Limits for tasks assessed                                        General Comments General comments (skin integrity, edema, etc.): vss.  Added cold pack to the throbbing R groin site.    Exercises     Assessment/Plan    PT Assessment Patient needs continued PT services  PT Problem List Decreased strength;Decreased activity tolerance;Decreased mobility;Decreased coordination;Pain;Decreased knowledge of use of DME;Impaired sensation;Decreased range of motion       PT Treatment Interventions DME instruction;Gait training;Functional mobility training;Therapeutic activities;Patient/family education;Modalities    PT Goals (Current goals can be found in the Care Plan section)  Acute Rehab PT Goals Patient Stated Goal: get this pain to stop or be manageable PT Goal Formulation: With patient Time For Goal Achievement: 10/19/20 Potential to Achieve Goals: Fair    Frequency Min 3X/week   Barriers to discharge        Co-evaluation               AM-PAC PT "6 Clicks" Mobility  Outcome Measure Help needed turning from your back to your side while in a flat bed without using bedrails?: A Lot Help needed moving from lying on your back to sitting on the side of a flat bed without using bedrails?: A Lot Help needed moving to and from a bed to a chair (including a wheelchair)?: A Lot Help needed standing up from a chair using your arms (e.g., wheelchair or bedside chair)?: A Lot Help needed to walk in hospital room?: A Lot Help needed climbing 3-5 steps with a railing? : A Lot 6 Click Score: 12    End of Session   Activity Tolerance: Patient limited by pain Patient left: in bed;with call bell/phone within reach Nurse Communication: Mobility status PT Visit Diagnosis: Other abnormalities of gait and mobility (R26.89);Pain;Difficulty in walking, not elsewhere classified (R26.2) Pain -  Right/Left:  (bil) Pain - part of body:  (R groin and L Leg)    Time: 9470-9628 PT Time Calculation (min) (ACUTE ONLY): 37 min   Charges:   PT Evaluation $PT Eval Moderate Complexity: 1 Mod PT Treatments $Therapeutic Activity: 8-22 mins        10/12/2020  Ginger Carne., PT Acute Rehabilitation Services [phone]  (pager) [phone]  (office)  Tessie Fass Tally Mckinnon 10/12/2020, 5:13 PM

## 2020-10-12 NOTE — Progress Notes (Addendum)
  Day of Surgery Note    Subjective:  Called to bedside regarding right groin/RLQ pain. Her IVC filter was placed earlier today without complications. Her heparin was stopped between 11:45 am.   Vitals:   10/12/20 1014 10/12/20 1125  BP:  (!) 95/57  Pulse: (!) 57 (!) 57  Resp: (!) 8 18  Temp:  98.2 F (36.8 C)  SpO2: 100% 95%    General: awake and alert in NAD Extremities:  RLE: groin site with dry bandage intact. No ecchymosis or hematoma. Motor and sensation in right foot intact with 2+ DP pulse Cardiac:  RRR Lungs:  Non-labored Abdomen:  + RLQ tenderness to palpation. Soft and nondistended   Assessment/Plan:  This is a 65 y.o. female who is s/p IVC filter placement. Right groin pain radiating to RLQ. VSS.  Will check CBC. Risa Grill, PA-C 10/12/2020 3:54 PM [phone]

## 2020-10-12 NOTE — Interval H&P Note (Signed)
History and Physical Interval Note:  10/12/2020 9:20 AM  [name]  has presented today for surgery, with the diagnosis of IVC.  The various methods of treatment have been discussed with the patient and family. After consideration of risks, benefits and other options for treatment, the patient has consented to  Procedure(s): IVC FILTER INSERTION (N/A) as a surgical intervention.  The patient's history has been reviewed, patient examined, no change in status, stable for surgery.  I have reviewed the patient's chart and labs.  Questions were answered to the patient's satisfaction.     Deitra Mayo

## 2020-10-12 NOTE — Progress Notes (Signed)
ANTICOAGULATION CONSULT NOTE  Pharmacy Consult:  Heparin Indication: DVT  Allergies  Allergen Reactions  . Zonisamide Other (See Comments)    Numbness and tingling over whole body  . Chocolate Flavor Other (See Comments)    Trigger for seizures  . Codeine Nausea And Vomiting  . Keppra [Levetiracetam] Other (See Comments)    Causes seizures  . Pregabalin Nausea And Vomiting  . Olive Oil Rash    Patient Measurements: Height: 5\' 7"  (170.2 cm) Weight: 60.6 kg (133 lb 9.6 oz) IBW/kg (Calculated) : 61.6 Heparin Dosing Weight: 59 kg  Vital Signs: Temp: 98 F (36.7 C) (03/10 2251) Temp Source: Oral (03/10 2251) BP: 105/52 (03/10 2251) Pulse Rate: 74 (03/10 2251)  Labs: Recent Labs    10/10/20 1247 10/10/20 1900 10/11/20 0540 10/11/20 1121 10/11/20 2023 10/12/20 0128  HGB 12.9  --  13.1  --   --  12.4  HCT 40.3  --  39.6  --   --  38.4  PLT 168  --  145*  --   --  145*  LABPROT 12.9  --   --   --   --   --   INR 1.0  --   --   --   --   --   HEPARINUNFRC  --    < > 0.53 0.75* 0.71* 0.67  CREATININE 0.78  --   --   --   --  0.83   < > = values in this interval not displayed.    Estimated Creatinine Clearance: 67.2 mL/min (by C-G formula based on SCr of 0.83 mg/dL).   Assessment: 55 YOF presenting from clinic with DVT on ultrasound today in LLE.  Hx of DVT and pAflutter not on anticoagulation PTA d/t high risk for bleeding complications (falls, hx ICH).  Pharmacy consulted to dose IV heparin.  Heparin level is slightly sub-therapeutic; no bleeding reported.  3/11 AM update:  Heparin level therapeutic after rate decrease  Goal of Therapy:  Heparin level 0.3-0.7 units/ml Monitor platelets by anticoagulation protocol: Yes   Plan:  Cont heparin 950 units/hr 1100 heparin level  Narda Bonds, PharmD, BCPS Clinical Pharmacist Phone: [phone]

## 2020-10-12 NOTE — Progress Notes (Signed)
Pt c/o 10/10 pain at groin site.   Site is soft and dry, but very tender to touch.  Assisted Pt to stand at edge of bed with RW.  Very unsteady and c/o of dizziness.  Attending MD made aware, will hold discharge.  Will notify [facility] of groin site pain.

## 2020-10-12 NOTE — Discharge Summary (Addendum)
Physician Discharge Summary  [name] WNU:[id number] DOB: 07-23-1956 DOA: 10/10/2020  PCP: [name], MD  Admit date: 10/10/2020 Discharge date: 10/12/2020  Admitted From: Home Disposition: Home  Recommendations for Outpatient Follow-up:  1. Follow up with PCP in 1 week with repeat CBC/BMP 2. Outpatient follow-up with vascular surgery 3. 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  She was found to have acute DVT involving left common femoral vein, SF junction, and left femoral vein along with an indeterminate deep vein thrombosis involving the left popliteal vein was also seen on duplex ultrasound done as an outpatient.  She was subsequently admitted and started on IV heparin.  Vascular surgery was consulted for possible IVC filter placement.  She underwent IVC filter placement today and vascular surgery has cleared the patient for discharge.  She will be discharged home today with outpatient follow-up with PCP and vascular surgery.  Discharge Diagnoses:   Acute left leg DVT - found to have acute DVT involving left common femoral vein, SF junction, and left femoral vein along with an indeterminate deep vein thrombosis involving the left popliteal vein was also seen on duplex ultrasound done as an outpatient -Continue heparin drip. -Vascular surgery following and planning for IVC filter placement at some point.  Patient is a poor candidate for oral anticoagulation given frequent falls and history of seizures  Dysuria -Urinalysis does not suggest UTI although UA shows WBCs  of 5-10.  Patient is afebrile with no leukocytosis.  Will hold off on starting antibiotics  Thrombocytopenia -No signs of bleeding.  Monitor  Leukopenia -Questionable cause.  Monitor  Macrocytosis -Outpatient follow-up  Paroxysmal A. Fib -Currently rate controlled.  Outpatient follow-up with cardiology.    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  Continue home regimen.  Outpatient follow-up with neurology  COPD  -currently stable.  Continue albuterol inhaler as needed   Discharge Instructions  Discharge Instructions    Ambulatory referral to Vascular Surgery   Complete by: As directed    Hospital follow-up   Diet general   Complete by: As directed    Increase activity slowly   Complete by: As directed      Allergies as of 10/13/2020      Reactions   Zonisamide Other (See Comments)   Numbness and tingling over whole body   Chocolate Flavor Other (See Comments)   Trigger for seizures   Codeine Nausea And Vomiting   Keppra [levetiracetam] Other (See Comments)   Causes seizures   Pregabalin Nausea And Vomiting   Olive Oil Rash      Medication List    TAKE these medications   acetaminophen 500 MG tablet Commonly known as: TYLENOL Take 1,000 mg by mouth every 6 (six) hours as needed for mild pain.   albuterol 108 (90 Base) MCG/ACT inhaler Commonly known as: VENTOLIN HFA Inhale 1 puff into the lungs every 4 (four) hours as needed for wheezing or shortness of breath.   cholecalciferol 25 MCG (1000 UNIT) tablet  Commonly known as: VITAMIN D3 Take 1 tablet (1,000 Units total) by mouth daily.   [employer] B-12 500 MCG tablet Generic drug: vitamin B-12 TAKE 2 TABLETS BY MOUTH EVERY DAY What changed: how much to take   escitalopram  10 MG tablet Commonly known as: Lexapro Take 1 tablet (10 mg total) by mouth at bedtime.   gabapentin 300 MG capsule Commonly known as: NEURONTIN Take 1 capsule every night What changed:   how much to take  how to take this  when to take this  additional instructions   LORazepam 1 MG tablet Commonly known as: Ativan Take 1 tablet (1 mg total) by mouth See admin instructions. Take 1 mg by mouth as directed/as needed for 3 or more seizures/24 hours-  Do not take more than 2 mg in 24 hours   meclizine 25 MG tablet Commonly known as: ANTIVERT Take 1 tablet (25 mg total) by mouth 3 (three) times daily as needed for dizziness.   phenytoin 30 MG ER capsule Commonly known as: DILANTIN Take 30 mg by mouth at bedtime. What changed: Another medication with the same name was changed. Make sure you understand how and when to take each.   phenytoin 100 MG ER capsule Commonly known as: DILANTIN 100mg  in AM, 200 mg in PM What changed:   how much to take  how to take this  when to take this  additional instructions   Vimpat 200 MG Tabs tablet Generic drug: lacosamide TAKE 1 TABLET BY MOUTH  TWICE DAILY What changed:   how much to take  when to take this   Xcopri 100 MG Tabs Generic drug: Cenobamate TAKE 1 TABLET BY MOUTH  DAILY What changed:   how much to take  when to take this         Allergies  Allergen Reactions  . Zonisamide Other (See Comments)    Numbness and tingling over whole body  . Chocolate Flavor Other (See Comments)    Trigger for seizures  . Codeine Nausea And Vomiting  . Keppra [Levetiracetam] Other (See Comments)    Causes seizures  . Pregabalin Nausea And Vomiting  . Olive Oil Rash    Consultations:  Vascular surgery   Procedures/Studies: PERIPHERAL VASCULAR CATHETERIZATION  Result Date: 10/12/2020 PATIENT: [name]      MRN: [id number] DOB: [birth year]    DATE OF PROCEDURE: 10/12/2020 INDICATIONS:  [name] is a 65 y.o.  female who had a left lower extremity DVT.  She had a contraindication to heparin because of frequent falls.  An IVC filter was recommended. PROCEDURE:  1.  Ultrasound-guided access to the right common femoral vein 2.  Cavogram 3.  Placement of Cook Celect Femoral IVC filter 4.  Conscious sedation SURGEON: Judeth Cornfield. Scot Dock, MD, FACS ANESTHESIA: Local with sedation EBL: Minimal TECHNIQUE: The patient was brought to the peripheral vascular lab and was sedated. The period of conscious sedation was 32 minutes.  During that time period, I was present face-to-face 100% of the time.  The patient was administered half milligram of Versed and 50 mcg of fentanyl. The patient's heart rate, blood pressure, and oxygen saturation were monitored by the nurse continuously during the procedure. The right groin was prepped and draped in the usual sterile fashion.  Under ultrasound guidance, after the skin was anesthetized, I cannulated the right common femoral vein with a micropuncture needle and a micropuncture sheath was introduced over a wire.  There was no clot in the right  common femoral vein.  A real-time image was obtained and sent to the server. Under fluoroscopic control I confirmed position of the wire and placed a micropuncture sheath.  A Bentson wire was advanced into the inferior vena cava and the tract over the wire dilated.  I then placed the coaxial sheath over the wire and positioned the tip of the dilator at approximately the L3 vertebral body.  A cavogram was obtained with the patient Valsalva limb to allow excellent demonstration of the position of the renal veins and the bifurcation. I then advanced the wire through the coaxial system and positioned the tip of the sheath at the top of the L3 vertebral body below the renal veins.  The wire and dilator were removed.  The femoral IVC filter was then passed through the sheath until the level of the tactile bump.  The sheath was then retracted and the filter  positioned at approximately L3 vertebral body just below the takeoff of the renal veins.  This was then pulled without difficulty.  The sheath was removed and pressure held for hemostasis.  No immediate complications were noted. FINDINGS: Successful placement of IVC filter at the L3 vertebral body below the renal veins. [name] Mayo, MD, FACS [facility] DATE OF DICTATION:   10/12/2020  VAS [place] LOWER EXTREMITY VENOUS (DVT)  Result Date: 10/10/2020  Lower Venous DVT Study Indications: History of DVT. Patient presents today with complaints of bilateral lower extremity pain, left worse than right since falling down 15 stairs on 08/02/2020. She also had a fall in the shower about 3 weeks ago. She denies any SOB.  Risk Factors: DVT history starting in 06/2013. Trauma to the legs after fall on 08/02/2020 and injury to the head and back after fall in the shower about 3 weeks ago. Comparison Study: NA Performing Technologist: Sharlett Iles RVT  Examination Guidelines: A complete evaluation includes B-mode imaging, spectral Doppler, color Doppler, and power Doppler as needed of all accessible portions of each vessel. Bilateral testing is considered an integral part of a complete examination. Limited examinations for reoccurring indications may be performed as noted. The reflux portion of the exam is performed with the patient in reverse Trendelenburg.  +---------+---------------+---------+-----------+----------+--------------+ RIGHT    CompressibilityPhasicitySpontaneityPropertiesThrombus Aging +---------+---------------+---------+-----------+----------+--------------+ CFV      Full           Yes      Yes                                 +---------+---------------+---------+-----------+----------+--------------+ SFJ      Full           Yes      Yes                                 +---------+---------------+---------+-----------+----------+--------------+ FV Prox   Full           Yes      Yes                                 +---------+---------------+---------+-----------+----------+--------------+ FV Mid   Full                                                        +---------+---------------+---------+-----------+----------+--------------+  FV DistalFull           Yes      Yes                                 +---------+---------------+---------+-----------+----------+--------------+ PFV      Full           Yes      Yes                                 +---------+---------------+---------+-----------+----------+--------------+ POP      Full           Yes      Yes                                 +---------+---------------+---------+-----------+----------+--------------+ PTV      Full           Yes      Yes                                 +---------+---------------+---------+-----------+----------+--------------+ PERO     Full           Yes      Yes                                 +---------+---------------+---------+-----------+----------+--------------+ Gastroc  Full                                                        +---------+---------------+---------+-----------+----------+--------------+ GSV      Full           Yes      Yes                                 +---------+---------------+---------+-----------+----------+--------------+   +---------+---------------+---------+-----------+------------+-----------------+ LEFT     CompressibilityPhasicitySpontaneityProperties  Thrombus Aging    +---------+---------------+---------+-----------+------------+-----------------+ CFV      Partial        Yes      Yes        softly      Acute                                                         echogenic                     +---------+---------------+---------+-----------+------------+-----------------+ SFJ      Partial        Yes      Yes        softly      Acute  echogenic                     +---------+---------------+---------+-----------+------------+-----------------+ FV Prox  Partial        Yes      No         softly      Acute                                                         echogenic                     +---------+---------------+---------+-----------+------------+-----------------+ FV Mid   None           No       No         softly      Acute                                                         echogenic                     +---------+---------------+---------+-----------+------------+-----------------+ FV DistalNone           No       No         softly      Acute                                                         echogenic                     +---------+---------------+---------+-----------+------------+-----------------+ PFV      Full           Yes      Yes                                      +---------+---------------+---------+-----------+------------+-----------------+ POP      Partial        No       No         softly and  Age Indeterminate                                             brightly                                                                  echogenic                     +---------+---------------+---------+-----------+------------+-----------------+ PTV      Full  Yes      Yes                                      +---------+---------------+---------+-----------+------------+-----------------+ PERO     Full           Yes      Yes                                      +---------+---------------+---------+-----------+------------+-----------------+ Gastroc  Full                                                             +---------+---------------+---------+-----------+------------+-----------------+ GSV      Full           Yes      Yes                                       +---------+---------------+---------+-----------+------------+-----------------+    Findings reported to Dr. Harrell Gave at 9:05 am.  Summary: RIGHT: - No evidence of deep vein thrombosis in the lower extremity. No indirect evidence of obstruction proximal to the inguinal ligament. - No cystic structure found in the popliteal fossa.  LEFT: - Findings consistent with acute deep vein thrombosis involving the left common femoral vein, SF junction, and left femoral vein. - Findings consistent with age indeterminate deep vein thrombosis involving the left popliteal vein. - No cystic structure found in the popliteal fossa.  *See table(s) above for measurements and observations. Electronically signed by Quay Burow MD on 10/10/2020 at 2:35:56 PM.    Final        Subjective: Patient seen and examined at bedside.  Complains of some mild cough.  No overnight fever or vomiting reported.  Feels slightly weak.  No bleeding reported.  Discharge Exam: Vitals:   10/12/20 1014 10/12/20 1125  BP:  (!) 95/57  Pulse: (!) 57 (!) 57  Resp: (!) 8 18  Temp:  98.2 F (36.8 C)  SpO2: 100% 95%    General: Pt is alert, awake, not in acute distress.  Looks very thinly built and chronically ill.  Currently on room air. Cardiovascular: Intermittently bradycardic, S1/S2 + Respiratory: bilateral decreased breath sounds at bases Abdominal: Soft, NT, ND, bowel sounds + Extremities: no edema, no cyanosis    The results of significant diagnostics from this hospitalization (including imaging, microbiology, ancillary and laboratory) are listed below for reference.     Microbiology: Recent Results (from the past 240 hour(s))  SARS CORONAVIRUS 2 (TAT 6-24 HRS) Nasopharyngeal Nasopharyngeal Swab     Status: None   Collection Time: 10/10/20 12:50 PM   Specimen: Nasopharyngeal Swab  Result Value Ref Range Status   SARS Coronavirus 2 NEGATIVE NEGATIVE Final    Comment: (NOTE) SARS-CoV-2 target nucleic acids are NOT  DETECTED.  The SARS-CoV-2 RNA is generally detectable in upper and lower respiratory specimens during the acute phase of infection. Negative results do not preclude SARS-CoV-2 infection, do not rule out co-infections with other pathogens, and should not be used as the sole basis for treatment  or other patient management decisions. Negative results must be combined with clinical observations, patient history, and epidemiological information. The expected result is Negative.  Fact Sheet for Patients: SugarRoll.be  Fact Sheet for Healthcare Providers: https://www.woods-mathews.com/  This test is not yet approved or cleared by the [place] FDA and  has been authorized for detection and/or diagnosis of SARS-CoV-2 by FDA under an Emergency Use Authorization (EUA). This EUA will remain  in effect (meaning this test can be used) for the duration of the COVID-19 declaration under Se ction 564(b)(1) of the Act, 21 U.S.C. section 360bbb-3(b)(1), unless the authorization is terminated or revoked sooner.  Performed at [facility], [place] [address], [place], [place] [address]      Labs: BNP (last 3 results) No results for input(s): BNP in the last 8760 hours. Basic Metabolic Panel: Recent Labs  Lab 10/10/20 1247 10/12/20 0128  NA 141 142  K 3.8 3.8  CL 105 108  CO2 27 29  GLUCOSE 96 88  BUN 7* 13  CREATININE 0.78 0.83  CALCIUM 9.3 9.1  MG  --  2.1   Liver Function Tests: Recent Labs  Lab 10/10/20 0935  AST 15  ALT 7  ALKPHOS 92  BILITOT 0.2  PROT 6.8  ALBUMIN 4.6   No results for input(s): LIPASE, AMYLASE in the last 168 hours. No results for input(s): AMMONIA in the last 168 hours. CBC: Recent Labs  Lab 10/10/20 1247 10/11/20 0540 10/12/20 0128  WBC 3.4* 3.4* 3.6*  NEUTROABS 1.7  --   --   HGB 12.9 13.1 12.4  HCT 40.3 39.6 38.4  MCV 107.8* 105.0* 106.4*  PLT 168 145* 145*   Cardiac Enzymes: No results for  input(s): CKTOTAL, CKMB, CKMBINDEX, TROPONINI in the last 168 hours. BNP: Invalid input(s): POCBNP CBG: No results for input(s): GLUCAP in the last 168 hours. D-Dimer No results for input(s): DDIMER in the last 72 hours. Hgb A1c No results for input(s): HGBA1C in the last 72 hours. Lipid Profile Recent Labs    10/10/20 0934  CHOL 219*  HDL 89  LDLCALC 112*  TRIG 106  CHOLHDL 2.5   Thyroid function studies No results for input(s): TSH, T4TOTAL, T3FREE, THYROIDAB in the last 72 hours.  Invalid input(s): FREET3 Anemia work up No results for input(s): VITAMINB12, FOLATE, FERRITIN, TIBC, IRON, RETICCTPCT in the last 72 hours. Urinalysis    Component Value Date/Time   COLORURINE YELLOW 10/10/2020 1503   APPEARANCEUR HAZY (A) 10/10/2020 1503   LABSPEC 1.013 10/10/2020 1503   PHURINE 6.0 10/10/2020 1503   GLUCOSEU NEGATIVE 10/10/2020 1503   HGBUR NEGATIVE 10/10/2020 1503   BILIRUBINUR NEGATIVE 10/10/2020 1503   KETONESUR NEGATIVE 10/10/2020 1503   PROTEINUR NEGATIVE 10/10/2020 1503   UROBILINOGEN 0.2 01/17/2015 2350   NITRITE NEGATIVE 10/10/2020 1503   LEUKOCYTESUR TRACE (A) 10/10/2020 1503   Sepsis Labs Invalid input(s): PROCALCITONIN,  WBC,  LACTICIDVEN Microbiology Recent Results (from the past 240 hour(s))  SARS CORONAVIRUS 2 (TAT 6-24 HRS) Nasopharyngeal Nasopharyngeal Swab     Status: None   Collection Time: 10/10/20 12:50 PM   Specimen: Nasopharyngeal Swab  Result Value Ref Range Status   SARS Coronavirus 2 NEGATIVE NEGATIVE Final    Comment: (NOTE) SARS-CoV-2 target nucleic acids are NOT DETECTED.  The SARS-CoV-2 RNA is generally detectable in upper and lower respiratory specimens during the acute phase of infection. Negative results do not preclude SARS-CoV-2 infection, do not rule out co-infections with other pathogens, and should not be  used as the sole basis for treatment or other patient management decisions. Negative results must be combined with  clinical observations, patient history, and epidemiological information. The expected result is Negative.  Fact Sheet for Patients: SugarRoll.be  Fact Sheet for Healthcare Providers: https://www.woods-mathews.com/  This test is not yet approved or cleared by the [place] FDA and  has been authorized for detection and/or diagnosis of SARS-CoV-2 by FDA under an Emergency Use Authorization (EUA). This EUA will remain  in effect (meaning this test can be used) for the duration of the COVID-19 declaration under Se ction 564(b)(1) of the Act, 21 U.S.C. section 360bbb-3(b)(1), unless the authorization is terminated or revoked sooner.  Performed at [facility], [place] [address] [place], [place] [address]      Time coordinating discharge: 35 minutes  SIGNED:   Aline August, MD  [facility] 10/12/2020, 11:28 AM

## 2020-10-13 ENCOUNTER — Inpatient Hospital Stay (HOSPITAL_COMMUNITY): Payer: Medicare Other

## 2020-10-13 LAB — CBC
HCT: 38.8 % (ref 36.0–46.0)
Hemoglobin: 13 g/dL (ref 12.0–15.0)
MCH: 34.9 pg — ABNORMAL HIGH (ref 26.0–34.0)
MCHC: 33.5 g/dL (ref 30.0–36.0)
MCV: 104.3 fL — ABNORMAL HIGH (ref 80.0–100.0)
Platelets: 134 10*3/uL — ABNORMAL LOW (ref 150–400)
RBC: 3.72 MIL/uL — ABNORMAL LOW (ref 3.87–5.11)
RDW: 14 % (ref 11.5–15.5)
WBC: 3.4 10*3/uL — ABNORMAL LOW (ref 4.0–10.5)
nRBC: 0 % (ref 0.0–0.2)

## 2020-10-13 LAB — COMPREHENSIVE METABOLIC PANEL
ALT: 8 U/L (ref 0–44)
AST: 12 U/L — ABNORMAL LOW (ref 15–41)
Albumin: 3.3 g/dL — ABNORMAL LOW (ref 3.5–5.0)
Alkaline Phosphatase: 64 U/L (ref 38–126)
Anion gap: 6 (ref 5–15)
BUN: 8 mg/dL (ref 8–23)
CO2: 27 mmol/L (ref 22–32)
Calcium: 9.2 mg/dL (ref 8.9–10.3)
Chloride: 109 mmol/L (ref 98–111)
Creatinine, Ser: 0.69 mg/dL (ref 0.44–1.00)
GFR, Estimated: >60 mL/min (ref >60)
Glucose, Bld: 90 mg/dL (ref 70–99)
Potassium: 3.8 mmol/L (ref 3.5–5.1)
Sodium: 142 mmol/L (ref 135–145)
Total Bilirubin: 0.5 mg/dL (ref 0.3–1.2)
Total Protein: 5.9 g/dL — ABNORMAL LOW (ref 6.5–8.1)

## 2020-10-13 LAB — MAGNESIUM: Magnesium: 2.1 mg/dL (ref 1.7–2.4)

## 2020-10-13 MED ORDER — MECLIZINE HCL 25 MG PO TABS: 25.0000 mg | ORAL_TABLET | Freq: Three times a day (TID) | ORAL | 0 refills | Status: DC | PRN

## 2020-10-13 MED ORDER — MECLIZINE HCL 25 MG PO TABS
25.0000 mg | ORAL_TABLET | Freq: Three times a day (TID) | ORAL | Status: DC | PRN
  Filled 2020-10-13: qty 1

## 2020-10-13 NOTE — TOC Transition Note (Signed)
Transition of Care [facility]) - CM/SW Discharge Note   Patient Details  Name: KANAE IGNATOWSKI MRN: [id number] Date of Birth: Mar 16, 1956  Transition of Care [facility]) CM/SW Contact:  Konrad Penta, RN Phone Number: [phone] 10/13/2020, 12:34 PM   Clinical Narrative:   Spoke with patient's daughter [name] who states patient does not need a wheelchair. Cancelled order for wheelchair with [place] with [place].   Patient lives with daughter and is returning home today. Made aware that patient was active with [facility] prior. Spoke with Anne Ng with [facility] to make aware of dc.  Spoke with Santiago Glad with [facility] to make referral for home health PT. Patient was active with [facility] in the past. [facility] to follow up on Monday with patient to confirm whether they can accept referral.   Patient already has rolling walker.  No further needs assessed.      Final next level of care: [place] Barriers to Discharge: No Barriers Identified   Patient Goals and CMS Choice Patient states their goals for this hospitalization and ongoing recovery are:: return home CMS Medicare.gov Compare Post Acute Care list provided to:: Patient Choice offered to / list presented to : Patient  Discharge Placement                       Discharge Plan and Services                DME Arranged: Lightweight manual wheelchair with seat cushion DME Agency: [employer] Date DME Agency Contacted: 10/13/20 Time DME Agency Contacted: 1217 Representative spoke with at DME Agency: Pura Spice HH Arranged: PT [place]: [facility] [place] Date [place]: 10/13/20 Time [place]: 1217 Representative spoke with at [place]: [place] Interventions     Readmission Risk Interventions No flowsheet data found.    Marthenia Rolling, MSN, RN,BSN [facility] Case Manager [phone]

## 2020-10-13 NOTE — Progress Notes (Signed)
Discharged pt home with daughter. Discharge instructions completed and questions answered.

## 2020-10-13 NOTE — Progress Notes (Signed)
  Progress Note    10/13/2020 9:51 AM 1 Day Post-Op  Subjective: Still with complaints of right groin and RLQ pain. Tolerating diet.   Vitals:   10/13/20 0320 10/13/20 0753  BP: (!) 120/49 (!) 115/37  Pulse: (!) 59 79  Resp: 18 19  Temp: 98 F (36.7 C) 98.1 F (36.7 C)  SpO2: 98% 94%    Physical Exam: General: awake and alert in NAD Extremities:  RLE: groin site with dry bandage intact. No ecchymosis or hematoma. Motor and sensation in right foot intact with 2+ DP pulse Cardiac:  RRR Lungs:  Non-labored Abdomen:  + RLQ tenderness to palpation. Soft and nondistended  CBC    Component Value Date/Time   WBC 3.4 (L) 10/13/2020 0129   RBC 3.72 (L) 10/13/2020 0129   HGB 13.0 10/13/2020 0129   HCT 38.8 10/13/2020 0129   HCT 38.5 02/17/2017 0930   PLT 134 (L) 10/13/2020 0129   MCV 104.3 (H) 10/13/2020 0129   MCH 34.9 (H) 10/13/2020 0129   MCHC 33.5 10/13/2020 0129   RDW 14.0 10/13/2020 0129   RDW 12.9 06/10/2013 0812   LYMPHSABS 1.5 10/10/2020 1247   LYMPHSABS 1.4 06/10/2013 0812   MONOABS 0.2 10/10/2020 1247   EOSABS 0.0 10/10/2020 1247   EOSABS 0.0 06/10/2013 0812   BASOSABS 0.0 10/10/2020 1247   BASOSABS 0.0 06/10/2013 0812    BMET    Component Value Date/Time   NA 142 10/13/2020 0129   NA 145 (H) 06/10/2013 0812   K 3.8 10/13/2020 0129   CL 109 10/13/2020 0129   CO2 27 10/13/2020 0129   GLUCOSE 90 10/13/2020 0129   BUN 8 10/13/2020 0129   BUN 13 06/10/2013 0812   CREATININE 0.69 10/13/2020 0129   CALCIUM 9.2 10/13/2020 0129   GFRNONAA >60 10/13/2020 0129   GFRAA >60 04/24/2020 1259     Intake/Output Summary (Last 24 hours) at 10/13/2020 0951 Last data filed at 10/13/2020 0754 Gross per 24 hour  Intake 120 ml  Output 400 ml  Net -280 ml    HOSPITAL MEDICATIONS Scheduled Meds: . Cenobamate  100 mg Oral q AM  . cholecalciferol  1,000 Units Oral Daily  . escitalopram  10 mg Oral QHS  . gabapentin  300 mg Oral QHS  . lacosamide  200 mg Oral BID   . phenytoin  100 mg Oral BID  . phenytoin  30 mg Oral QHS   Continuous Infusions: PRN Meds:.acetaminophen, albuterol, alum & mag hydroxide-simeth, LORazepam  Assessment and Plan: This is a 65 y.o. female who is s/p IVC filter placement. Right groin pain radiating to RLQ. VSS. Follow-up CBCs x 2 post-procedure: H and H normal. No vomiting. Electrolytes normal. OK to discharge home from vascular standpoint.   Risa Grill, PA-C [facility] [phone] 10/13/2020  9:51 AM

## 2020-10-13 NOTE — Progress Notes (Signed)
Patient ID: [name], female   DOB: 10-Oct-1955, 65 y.o.   MRN: [id number] Patient was supposed to be discharged on 10/12/2020 but discharge was held because of pain in the groin region and some gait instability.  Vascular surgery has subsequently evaluated the patient and has cleared the patient for discharge.  PT recommended home health PT.  She will be discharged home today with outpatient follow-up with PCP and vascular surgery along with home health PT.  Please refer to the discharge summary done by me on 10/12/2020 for full details.

## 2020-10-15 ENCOUNTER — Telehealth: Payer: Self-pay | Admitting: Neurology

## 2020-10-15 ENCOUNTER — Encounter (HOSPITAL_COMMUNITY): Payer: Medicare Other

## 2020-10-15 NOTE — Telephone Encounter (Signed)
Patient was discharged from the hospital and is now scheduled for follow up with [name] on 01/04/21. Will this be soon enough?

## 2020-10-15 NOTE — Telephone Encounter (Signed)
I can see her on March 24 at 12nn, pls keep the June appt also, thanks. Pls make sure daughter knows, thanks

## 2020-10-16 NOTE — Telephone Encounter (Signed)
That is fine, thanks 

## 2020-10-25 ENCOUNTER — Telehealth (INDEPENDENT_AMBULATORY_CARE_PROVIDER_SITE_OTHER): Payer: Medicare Other | Admitting: Neurology

## 2020-10-25 ENCOUNTER — Other Ambulatory Visit: Payer: Self-pay

## 2020-10-25 ENCOUNTER — Encounter: Payer: Self-pay | Admitting: Neurology

## 2020-10-25 VITALS — Ht 67.0 in | Wt 132.0 lb

## 2020-10-25 DIAGNOSIS — G629 Polyneuropathy, unspecified: Secondary | ICD-10-CM | POA: Diagnosis not present

## 2020-10-25 DIAGNOSIS — G40219 Localization-related (focal) (partial) symptomatic epilepsy and epileptic syndromes with complex partial seizures, intractable, without status epilepticus: Secondary | ICD-10-CM

## 2020-10-25 DIAGNOSIS — F419 Anxiety disorder, unspecified: Secondary | ICD-10-CM

## 2020-10-25 MED ORDER — DILANTIN 100 MG PO CAPS: ORAL_CAPSULE | ORAL | 3 refills | Status: DC

## 2020-10-25 MED ORDER — ESCITALOPRAM OXALATE 10 MG PO TABS: 10.0000 mg | ORAL_TABLET | Freq: Every day | ORAL | 3 refills | Status: DC

## 2020-10-25 MED ORDER — DILANTIN 30 MG PO CAPS: ORAL_CAPSULE | ORAL | 3 refills | Status: DC

## 2020-10-25 MED ORDER — GABAPENTIN 300 MG PO CAPS: ORAL_CAPSULE | ORAL | 3 refills | Status: DC

## 2020-10-25 MED ORDER — XCOPRI 100 MG PO TABS: 1.0000 | ORAL_TABLET | Freq: Every day | ORAL | 3 refills | Status: DC

## 2020-10-25 MED ORDER — LACOSAMIDE 200 MG PO TABS: 200.0000 mg | ORAL_TABLET | Freq: Two times a day (BID) | ORAL | 5 refills | Status: DC

## 2020-10-25 NOTE — Progress Notes (Signed)
Virtual Visit via Video Note The purpose of this virtual visit is to provide medical care while limiting exposure to the novel coronavirus.    Consent was obtained for video visit:  Yes.   Answered questions that patient had about telehealth interaction:  Yes.   I discussed the limitations, risks, security and privacy concerns of performing an evaluation and management service by telemedicine. I also discussed with the patient that there may be a patient responsible charge related to this service. The patient expressed understanding and agreed to proceed.  Pt location: Home Physician Location: office Name of referring provider:  [name], MD I connected with Ander Gaster at patients initiation/request on 10/25/2020 at 12:00 PM EDT by video enabled telemedicine application and verified that I am speaking with the correct person using two identifiers. Pt MRN:  [id number] Pt DOB:  Aug 13, 1955 Video Participants:  Ander Gaster;  [name] (daughter)   History of Present Illness:  The patient had a virtual video visit on 10/25/2020. 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 Her daughter [name] is present to provide additional information. On her last visit, they reported that home health checked her Dilantin level and found it to be low, so dose was increased to 100mg  in AM, 200mg  in PM. Repeat level in 06/2020 on this dose was 14.7. She was ataxic however, and states she could not walk, self-reduced dose back to how she has been taking it for many years, 100mg  in AM, 130mg  in PM. She was admitted to [facility] for 10 days in December 2021 after a fall at home where she sustained an acute fracture of the anterior aspect of the left lateral mass C2, slight anterior displacement of fracture fragment. She was evaluated by Neurosurgery and managed conservatively. She still has some neck pain, however she also has pre-existing cervical dystonia. During her admission,  she was also found to be in atrial fibrillation with rapid RVR. She was not a candidate for anticoagulation. Dilantin level was 2.9 and she was advised to increase dose back again, but she stayed on 100mg  in AM, 130mg  in PM. She was back at [facility] earlier this month for left leg swelling, found to have an acute DVT involving left common femoral vein, SF junction, and left femoral vein along with an indeterminate deep vein thrombosis involving the left popliteal vein. She was started on IV heparin which [name] reports significantly helped with the swelling and discoloration, then an IVC filter was placed. She has been doing really well since then. Dilantin level in the hospital was 4.9. They report her last seizure was over 3 weeks ago, prior to her last hospitalization. They are satisfied with current seizure control on current regimen with minimal side effects. She is on Dilantin 100mg  in AM, 130mg  in PM, Vimpat 200mg  BID, and Xcopri 100mg  daily. She has gabapentin 300mg  qhs for neuropathy. She is on Lexapro 10mg  daily for mood. [name] keeps the prn Ativan for seizure rescue and has only used it 3 times. She reports her urine smells bad, no UTI noted on urinalysis.    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 She had been seeing epileptologist [name], records were reviewed and will be summarized as follows. In the past, seizures  would start with headache and burning sensation of the left arm, which may be followed by left arm jerking. Over time, she has not had any prior warning symptoms. She has had seizures where she is noted to have kicking and rocking movements of the pelvis with unresponsiveness. She was in the EMU in 10/2012 where 11 seizures were captured from the right  posterior hemisphere/right inferior temporal region. Interictal EEG showed abundant right posterior temporal epileptiform discharges, frequent right anterior temporal epileptiform discharges, as well as occasional independent left anterior temporal epileptiform discharges. There was focal slowing over the right posterior hemisphere. MRI brain in 2014 showed right parietal/occipital encephalomalacia, right hippocampus smaller than left. PET scan in 2015 showed multiple areas of hypometabolism on the right, mostly corresponding to or adjacent to the structural abnormality. Ictal SPECT showed right hemisphere hypoperfusion. SISCOM showed right frontal region significant on the subtraction. MEG in 07/2013 showed spikes localizing to the right occipital, parietal, mesial temporal regions with a weak signal left mesial parietal. Concomitant EEG showed right posterior spikes. Functional mapping suggestive of left language lateralization. She has tried multiple AEDs in the past with various side effects or ineffective, and has been taking Dilantin 160mg  in AM, 200mg  in PM and Vimpat 200mg  BID with seizures around once a month. She was admitted to [facility] on 03/19/18 for left leg weakness and drowsiness. She was fishing with her boyfriend when she suddenly could not move her left leg and almost fell. She states that her boyfriend told her that she was shaking and had a seizure while they were fishing. She was brought to [facility] where Dilantin level was 33.8. She was adamant that she has been taking her medications as instructed and did not take more than prescribed, no new medications taken. She had an MRI brain without contrast which I personally reviewed, no acute changes seen. Large volume right parieto-occipital encephalomalacia was unchanged. Right hippocampus is smaller. She continued to report weakness and had an MRI cervical and lumbar spine which did not show any myelopathy, there was prominent left-sided facet hypertrophy  at C4-5 with resultant moderate left C5 foraminal stenosis, degenerative disc osteophyte at C5-6 with severe right C6 foraminal stenosis, and mild spinal stenosis at C5-6 and C6-7, chronic borderline to mild multifactorial spinal stenosis at L2-3 through L4-5 with no convincing lateral recess or foraminal stenosis. She continued to report burning pain in her legs and was started on low dose gabapentin 300mg  qhs with no side effects. Dilantin was held in the hospital, she was discharged on a lower dose, but states that she has been taking her pre-hospital dose of 160mg  in AM, 200mg  in PM but has had 4 seizures since hospital discharge 2 weeks ago, which is unusual. She had 2 seizures in one day in the car with her mother-in-law, her mother-in-law had to pull over. The next day she had 2 at home, her daughter found her in the bathroom. She has not been sleeping well the past few days. She has a headache after the seizures. Aside from the convulsions, she has episodes where a feeling comes over her left side, from the arm down her leg, then she is weaker after. She denies any jerking. She is quite anxious in the office today and perseverates on her symptoms. She states she "stays nervous." She reports taking prn Ativan in the past, but discontinued at [facility]. She repeatedly states her feet are numb and they "feel like they are grabbed." She feels gabapentin has helped some  with this.   Prior AEDs: phenobarbital (ineffective, mood issues), Depakote (multiple trials, ineffective), Keppra (ineffective), Zonisamide (side effects on brief trial of higher dose than prescribed - 500mg  instead of 200mg ), Onfi (mouth sores), Tegretol (ineffective), Lamictal (nausea, dizziness, malaise), Felbatol (nausea, dizziness, malaise), Topamax (ineffective), Tigabine (tried per notes, details unclear), Neurontin (ineffective, nausea), Lyrica (leg pain reported on 25mg  daily, tingling), Briviact (drowsiness), Fycompa  Epilepsy Risk  Factors:  Right parieto-occipital AVM s/p rupture and hemorrhage in 1988, s/p craniectomy. Otherwise she had a normal birth and early development.  There is no history of febrile convulsions, CNS infections such as meningitis/encephalitis, or family history of seizures.  Diagnostic Data:  MRI brain without contrast 05/2018 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  EEG done 06/2018 showed right posterior temporal slowing, occasional epileptiform discharges over the right posterior temporal region, breach artifact in this region.   MRI cervical, thoracic, and lumbar spine done 07/2018 showed no convincing cervical or thoracic spinal cord abnormality. Mild degenerative cervical spinal stenosis at C5-6 and C6-7 with moderate or severe bilateral foraminal stenosis; no thoracic spinal or foraminal stenosis; stable borderline to mild lumbar spinal stenosis  MRI lumbar spine in 2019 showed borderline to mild multifactorial spinal stenosis at L2-L3 and L3-L4 related to circumferential disc bulge and posterior element Hypertrophy.  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     Current Outpatient Medications on File Prior to Visit  Medication Sig Dispense Refill  . acetaminophen (TYLENOL) 500 MG tablet Take 1,000 mg by mouth every 6 (six) hours as needed for mild pain.    Marland Kitchen albuterol (VENTOLIN HFA) 108 (90 Base) MCG/ACT inhaler Inhale 1 puff into the lungs every 4 (four) hours as needed for wheezing or shortness of breath.     . cholecalciferol (VITAMIN D3) 25 MCG (1000 UNIT) tablet Take 1 tablet (1,000 Units total) by mouth daily. 90 tablet 3  . [employer] B-12 500 MCG tablet TAKE 2 TABLETS BY MOUTH EVERY DAY (Patient taking differently: Take 1,000 mcg by mouth daily.) 100 tablet 0  . escitalopram (LEXAPRO) 10 MG tablet Take 1 tablet (10  mg total) by mouth at bedtime.    . gabapentin (NEURONTIN) 300 MG capsule Take 1 capsule every night (Patient taking differently: Take 300 mg by mouth at bedtime.) 90 capsule 3  . LORazepam (ATIVAN) 1 MG tablet Take 1 tablet (1 mg total) by mouth See admin instructions. Take 1 mg by mouth as directed/as needed for 3 or more seizures/24 hours-  Do not take more than 2 mg in 24 hours    . meclizine (ANTIVERT) 25 MG tablet Take 1 tablet (25 mg total) by mouth 3 (three) times daily as needed for dizziness. 30 tablet 0  . phenytoin (DILANTIN) 100 MG ER capsule 100mg  in AM, 200 mg in PM (Patient taking differently: Take 100 mg by mouth in the morning and at bedtime.) 180 capsule 3  . phenytoin (DILANTIN) 30 MG ER capsule Take 30 mg by mouth at bedtime.    Marland Kitchen VIMPAT 200 MG TABS tablet TAKE 1 TABLET BY MOUTH  TWICE DAILY (Patient taking differently: Take 200 mg by mouth in the morning and at bedtime.) 180 tablet 5  . XCOPRI 100 MG TABS TAKE 1 TABLET BY MOUTH  DAILY (Patient taking differently: Take 100 mg by mouth in the morning.) 90 tablet 3   No current  facility-administered medications on file prior to visit.     Observations/Objective:   Vitals:   10/25/20 1101  Weight: 132 lb (59.9 kg)  Height: 5\' 7"  (1.702 m)   GEN:  The patient appears stated age and is in NAD.  Neurological examination: Patient is awake, alert. No aphasia or dysarthria. Intact fluency and comprehension. Cranial nerves: Extraocular movements intact with no nystagmus. No facial asymmetry. Motor: moves all extremities symmetrically, at least anti-gravity x 4. No incoordination on finger to nose testing. Gait: slightly wide-based, no ataxia, holding her left arm flexed to her side. 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 She has tried multiple AEDs in the past with various side effects. She has had several admissions for Dilantin toxicity. She cannot tolerate higher doses of Dilantin, we have agreed to stay on Dilantin 100mg  in AM, 130mg  in PM despite subtherapeutic levels. They are satisfied with current seizure control, last seizure was over 3 weeks ago. Continue Vimpat 200mg  BID, Xcopri 100mg  daily. We may increase dose if necessary. She is on gabapentin 300mg  qhs for neuropathy. Continue Lexapro 10mg  daily for anxiety. She does not drive. Follow-up as scheduled in June, call for any changes.    Follow Up Instructions:   -I discussed the assessment and treatment plan with the patient. The patient was provided an opportunity to ask questions and all were answered. The patient agreed with the plan and demonstrated an understanding of the instructions.   The patient was advised to call back or seek an in-person evaluation if the symptoms worsen or if the condition fails to improve as anticipated.   [name], MD

## 2020-10-25 NOTE — Patient Instructions (Signed)
Good to see you! Continue all your medications, refills sent. Follow-up as scheduled in June 2022.    Seizure Precautions: 1. If medication has been prescribed for you to prevent seizures, take it exactly as directed.  Do not stop taking the medicine without talking to your doctor first, even if you have not had a seizure in a long time.   2. Avoid activities in which a seizure would cause danger to yourself or to others.  Don't operate dangerous machinery, swim alone, or climb in high or dangerous places, such as on ladders, roofs, or girders.  Do not drive unless your doctor says you may.  3. If you have any warning that you may have a seizure, lay down in a safe place where you can't hurt yourself.    4.  No driving for 6 months from last seizure, as per [facility].   Please refer to the following link on the [place] website for more information: [email or web address]/[email or web address]   5.  Maintain good sleep hygiene. Avoid alcohol.  6.  Contact your doctor if you have any problems that may be related to the medicine you are taking.  7.  Call 911 and bring the patient back to the ED if:        A.  The seizure lasts longer than 5 minutes.       B.  The patient doesn't awaken shortly after the seizure  C.  The patient has new problems such as difficulty seeing, speaking or moving  D.  The patient was injured during the seizure  E.  The patient has a temperature over 102 F (39C)  F.  The patient vomited and now is having trouble breathing

## 2020-12-05 ENCOUNTER — Ambulatory Visit: Payer: Medicare Other | Admitting: Internal Medicine

## 2020-12-13 ENCOUNTER — Ambulatory Visit: Payer: Medicare Other | Admitting: Internal Medicine

## 2020-12-13 ENCOUNTER — Encounter: Payer: Self-pay | Admitting: Internal Medicine

## 2020-12-13 ENCOUNTER — Other Ambulatory Visit: Payer: Self-pay

## 2020-12-13 VITALS — BP 130/72 | HR 72 | Ht 67.0 in | Wt 137.8 lb

## 2020-12-13 DIAGNOSIS — Z95828 Presence of other vascular implants and grafts: Secondary | ICD-10-CM

## 2020-12-13 DIAGNOSIS — Z9189 Other specified personal risk factors, not elsewhere classified: Secondary | ICD-10-CM | POA: Diagnosis not present

## 2020-12-13 DIAGNOSIS — I4892 Unspecified atrial flutter: Secondary | ICD-10-CM

## 2020-12-13 DIAGNOSIS — I629 Nontraumatic intracranial hemorrhage, unspecified: Secondary | ICD-10-CM

## 2020-12-13 NOTE — Progress Notes (Signed)
OFFICE NOTE  Chief Complaint:  Follow-up DVT  Primary Care Physician: [name], MD  HPI:  [name] is a 65 y.o. 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  She was recently hospitalized in December for syncope after a fall.  I had only seen her once in 2018 in the hospital.  New onset atrial flutter with ventricular rapid rate.  She was on Cardizem for rate control.  Given IV amiodarone and fortunately had converted to sinus rhythm.  She was not considered an anticoagulation candidate.  Subsequently she was seen in follow-up by Doreene Adas, PA.  She was noted to have left leg swelling and pain.  Lower extremity venous ultrasound was performed which showed extensive DVT of the left common femoral vein, saphenofemoral junction and left femoral vein.  Then seen by Dr. Austin Miles about her history of bleeding and the fact that she was not really likely a candidate for anticoagulation.  Subsequently she was then admitted to the hospital.  She was initially placed on IV heparin and consulted by Dr. Trula Slade with vascular surgery.  He had recommended an IVC filter.  This was subsequently placed by Dr. Doren Custard.  She has had no further follow-up.  Report improvement in her pain and resolution of her swelling.  She has questions today as to how long she may need her IVC filter and what further follow-up is necessary.  PMHx:  Past Medical History:  Diagnosis Date  . Asthma   . Cerebral hemorrhage ([place]) 1989  . Chronic back pain   . COPD (chronic obstructive pulmonary disease) ([place])   . DDD (degenerative disc disease) 06/12/2013  . DVT (deep venous thrombosis) ([place])    "she's had several since 1990"  . GERD (gastroesophageal reflux disease)   . Headache    "usually around time when she's had a seizure"  . History of blood  transfusion    "when she was a baby"  . History of stomach ulcers   . Kidney stones   . Neuropathy   . Seizures ([place])    "because of her brain surgery"  . Stroke [facility]) [phone]   family denies residual on 11/09/2014  . Tunnel vision    "since brain OR"    Past Surgical History:  Procedure Laterality Date  . [place]   craniotomy with hematoma evacuation  . CESAREAN SECTION  1979; 1987; 1989  . HEMORRHOID SURGERY    . IVC FILTER INSERTION N/A 10/12/2020   Procedure: IVC FILTER INSERTION;  Surgeon: Angelia Mould, MD;  Location: [facility];  Service: Cardiovascular;  Laterality: N/A;  . TUBAL LIGATION  1990's    FAMHx:  Family History  Problem Relation Age of Onset  . Heart attack Mother        4 MIs, started in her 17s, also vaginal cancer and colon cancer  . Cancer Mother   . Heart attack Father        Died suddenly age 39 - autopsy revealed heart attack her patient  . Cancer Brother        Sinus cancer    SOCHx:   reports that she quit smoking about 3 years ago. Her smoking use included cigarettes. She has a 10.00 pack-year smoking history. She has never used smokeless tobacco. She reports that she does not drink alcohol  and does not use drugs.  ALLERGIES:  Allergies  Allergen Reactions  . Zonisamide Other (See Comments)    Numbness and tingling over whole body  . Chocolate Flavor Other (See Comments)    Trigger for seizures  . Codeine Nausea And Vomiting  . Keppra [Levetiracetam] Other (See Comments)    Causes seizures  . Pregabalin Nausea And Vomiting  . Olive Oil Rash    ROS: Pertinent items noted in HPI and remainder of comprehensive ROS otherwise negative.  HOME MEDS: Current Outpatient Medications on File Prior to Visit  Medication Sig Dispense Refill  . acetaminophen (TYLENOL) 500 MG tablet Take 1,000 mg by mouth every 6 (six) hours as needed for mild pain.    Marland Kitchen albuterol (VENTOLIN HFA) 108 (90 Base) MCG/ACT inhaler Inhale 1  puff into the lungs every 4 (four) hours as needed for wheezing or shortness of breath.     . Cenobamate (XCOPRI) 100 MG TABS Take 1 tablet by mouth daily. 90 tablet 3  . cholecalciferol (VITAMIN D3) 25 MCG (1000 UNIT) tablet Take 1 tablet (1,000 Units total) by mouth daily. 90 tablet 3  . [facility] B-12 500 MCG tablet TAKE 2 TABLETS BY MOUTH EVERY DAY (Patient taking differently: Take 1,000 mcg by mouth daily.) 100 tablet 0  . DILANTIN 100 MG ER capsule Take 1 capsule twice a day 180 capsule 3  . DILANTIN 30 MG ER capsule Take 1 capsule every night (take with 100mg  capsule for total of 130mg  every night) 90 capsule 3  . escitalopram (LEXAPRO) 10 MG tablet Take 1 tablet (10 mg total) by mouth at bedtime. 90 tablet 3  . gabapentin (NEURONTIN) 300 MG capsule Take 1 capsule every night 90 capsule 3  . lacosamide (VIMPAT) 200 MG TABS tablet Take 1 tablet (200 mg total) by mouth 2 (two) times daily. 180 tablet 5  . LORazepam (ATIVAN) 1 MG tablet Take 1 tablet (1 mg total) by mouth See admin instructions. Take 1 mg by mouth as directed/as needed for 3 or more seizures/24 hours-  Do not take more than 2 mg in 24 hours     No current facility-administered medications on file prior to visit.    LABS/IMAGING: No results found for this or any previous visit (from the past 48 hour(s)). No results found.  LIPID PANEL:    Component Value Date/Time   CHOL 219 (H) 10/10/2020 0934   TRIG 106 10/10/2020 0934   HDL 89 10/10/2020 0934   CHOLHDL 2.5 10/10/2020 0934   CHOLHDL 2.2 05/24/2018 0556   VLDL 6 05/24/2018 0556   LDLCALC 112 (H) 10/10/2020 0934     WEIGHTS: Wt Readings from Last 3 Encounters:  12/13/20 137 lb 12.8 oz (62.5 kg)  10/25/20 132 lb (59.9 kg)  10/11/20 133 lb 9.6 oz (60.6 kg)    VITALS: BP 130/72   Pulse 72   Ht 5\' 7"  (1.702 m)   Wt 137 lb 12.8 oz (62.5 kg)   SpO2 97%   BMI 21.58 kg/m   EXAM: General appearance: alert and no distress Neck: no carotid bruit, no JVD and thyroid  not enlarged, symmetric, no tenderness/mass/nodules Lungs: clear to auscultation bilaterally Heart: regular rate and rhythm, S1, S2 normal, no murmur, click, rub or gallop Abdomen: soft, non-tender; bowel sounds normal; no masses,  no organomegaly Extremities: extremities normal, atraumatic, no cyanosis or edema Pulses: 2+ and symmetric Skin: Skin color, texture, turgor normal. No rashes or lesions Neurologic: Grossly normal Psych: Pleasant  EKG: Normal  sinus rhythm- personally reviewed  ASSESSMENT: 1. Paroxysmal atrial flutter-not an anticoagulation candidate 2. History of upper and recent lower extremity DVT-not an anticoagulation candidate 3. 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 History of intracranial AV malformation with rupture in 1988, seizures, chronic headaches 5. COPD 6. Chronic back pain/polyneuropathy 7. Family history of early coronary artery disease in both parents  PLAN: 1.   [name] has an extensive cardiac history with recent atrial flutter and lower extremity DVT which was extensive.  Unfortunately she was not considered anticoagulation candidate due to history of intracerebral hemorrhage secondary to AV malformation.  Accordingly, vascular surgery elected to place an IVC filter.  I suspect this is permanent however it was not clearly outlined in the notes and she has no follow-up with them.  I will reach out to Dr. Trula Slade to see his opinion on this.  As she is no longer an anticoagulation candidate in the future, its not likely of any benefit to reimage for resolution of her thrombus since we will not change management.  She does have an extensive family history of early onset heart disease and will need to be mindful of that.  EKG shows a sinus rhythm today so fortunately she is not back in a flutter.  Plan follow-up with me annually or sooner as necessary.  Pixie Casino, MD, [facility] [place] Director of the Advanced Lipid  Disorders &  Cardiovascular Risk Reduction Clinic Diplomate of the American Board of Clinical Lipidology Attending Cardiologist  Direct Dial: [phone]  Fax: [phone]  Website:  [email or web address] 12/13/2020, 8:32 AM

## 2020-12-13 NOTE — Patient Instructions (Signed)

## 2021-01-04 ENCOUNTER — Ambulatory Visit: Payer: Medicare Other | Admitting: Neurology

## 2021-01-04 ENCOUNTER — Other Ambulatory Visit: Payer: Self-pay

## 2021-01-04 DIAGNOSIS — I824Y9 Acute embolism and thrombosis of unspecified deep veins of unspecified proximal lower extremity: Secondary | ICD-10-CM

## 2021-01-28 ENCOUNTER — Ambulatory Visit: Payer: Medicare Other | Admitting: Neurology

## 2021-01-28 ENCOUNTER — Other Ambulatory Visit: Payer: Self-pay

## 2021-01-28 ENCOUNTER — Other Ambulatory Visit: Payer: Self-pay | Admitting: Neurology

## 2021-01-28 ENCOUNTER — Encounter: Payer: Self-pay | Admitting: Neurology

## 2021-01-28 VITALS — BP 125/67 | HR 80 | Ht 67.0 in | Wt 140.8 lb

## 2021-01-28 DIAGNOSIS — G629 Polyneuropathy, unspecified: Secondary | ICD-10-CM

## 2021-01-28 DIAGNOSIS — F419 Anxiety disorder, unspecified: Secondary | ICD-10-CM

## 2021-01-28 DIAGNOSIS — G243 Spasmodic torticollis: Secondary | ICD-10-CM | POA: Diagnosis not present

## 2021-01-28 DIAGNOSIS — G40219 Localization-related (focal) (partial) symptomatic epilepsy and epileptic syndromes with complex partial seizures, intractable, without status epilepticus: Secondary | ICD-10-CM

## 2021-01-28 MED ORDER — ESCITALOPRAM OXALATE 10 MG PO TABS: 10.0000 mg | ORAL_TABLET | Freq: Every day | ORAL | 3 refills | Status: DC

## 2021-01-28 MED ORDER — GABAPENTIN 300 MG PO CAPS: ORAL_CAPSULE | ORAL | 3 refills | Status: DC

## 2021-01-28 MED ORDER — DILANTIN 100 MG PO CAPS: ORAL_CAPSULE | ORAL | 3 refills | Status: DC

## 2021-01-28 MED ORDER — DILANTIN 30 MG PO CAPS: ORAL_CAPSULE | ORAL | 3 refills | Status: DC

## 2021-01-28 MED ORDER — XCOPRI 100 MG PO TABS: 1.0000 | ORAL_TABLET | Freq: Every day | ORAL | 3 refills | Status: DC

## 2021-01-28 MED ORDER — LACOSAMIDE 200 MG PO TABS: 200.0000 mg | ORAL_TABLET | Freq: Two times a day (BID) | ORAL | 3 refills | Status: DC

## 2021-01-28 MED ORDER — LORAZEPAM 1 MG PO TABS
1.0000 mg | ORAL_TABLET | ORAL | 5 refills | Status: DC
Start: 2021-01-28 — End: 2021-05-31

## 2021-01-28 NOTE — Progress Notes (Signed)
NEUROLOGY FOLLOW UP OFFICE NOTE  [name] [id number] 20-Jul-1956  HISTORY OF PRESENT ILLNESS: I had the pleasure of seeing [name] in follow-up in the neurology clinic on 01/28/2021.  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 She is again accompanied by her daughter [name] who helps supplement the history today.  Records and images were personally reviewed where available. Since her last visit, they report seizures are stable. [name] states she has been doing really good, their home is smooth sailing at this time. She has an average of 1 seizure a month, last seizure was 2 weeks ago. [name] reports it was a small one, she was sitting and came out of it pretty quickly, no injuries. She is taking Dilantin 100mg  in AM, 130mg  in PM, Lacosamide 200mg  BID, Cenobamate 100mg  daily. She has prn lorazepam for seizure rescue, [name] has given it 2-3 times since her last visit. She is on Gabapentin 300mg  qhs for neuropathy and states neuropathy is doing good. She denies any headaches, dizziness, focal weakness, no falls. Sleep is good. When asked about mood, she looks to [name] who says "she has her days." She is on Lexapro 10mg  daily. No side effects on medications. She reports her head is bobbing more, she was previously getting Botox for cervical dystonia. They report easy bruising since she was on anticoagulation for DVT, she is no longer on anticoagulation.   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 She had been seeing epileptologist [name], records were reviewed and will be summarized as follows. In the past, seizures would start with headache and burning sensation of the left arm, which may be followed by left arm jerking. Over time, she has  not had any prior warning symptoms. She has had seizures where she is noted to have kicking and rocking movements of the pelvis with unresponsiveness. She was in the EMU in 10/2012 where 11 seizures were captured from the right posterior hemisphere/right inferior temporal region. Interictal EEG showed abundant right posterior temporal epileptiform discharges, frequent right anterior temporal epileptiform discharges, as well as occasional independent left anterior temporal epileptiform discharges. There was focal slowing over the right posterior hemisphere. MRI brain in 2014 showed right parietal/occipital encephalomalacia, right hippocampus smaller than left. PET scan in 2015 showed multiple areas of hypometabolism on the right, mostly corresponding to or adjacent to the structural abnormality. Ictal SPECT showed right hemisphere hypoperfusion. SISCOM showed right frontal region significant on the subtraction. MEG in 07/2013 showed spikes localizing to the right occipital, parietal, mesial temporal regions with a weak signal left mesial parietal. Concomitant EEG showed right posterior spikes. Functional mapping suggestive of left language lateralization. She has tried multiple AEDs in the past with various side effects or ineffective, and has been taking Dilantin 160mg  in AM, 200mg  in PM and Vimpat 200mg  BID with seizures around once a month. She was admitted to [facility] on 03/19/18 for left leg weakness and drowsiness. She was fishing with her boyfriend when she suddenly could not move her left leg and almost fell. She states that her boyfriend told her that she was shaking and had a seizure while they were fishing. She was brought to [facility] where Dilantin level was 33.8. She was adamant that she has been taking her medications as instructed and  did not take more than prescribed, no new medications taken. She had an MRI brain without contrast which I personally reviewed, no acute changes seen. Large volume right  parieto-occipital encephalomalacia was unchanged. Right hippocampus is smaller. She continued to report weakness and had an MRI cervical and lumbar spine which did not show any myelopathy, there was prominent left-sided facet hypertrophy at C4-5 with resultant moderate left C5 foraminal stenosis, degenerative disc osteophyte at C5-6 with severe right C6 foraminal stenosis, and mild spinal stenosis at C5-6 and C6-7, chronic borderline to mild multifactorial spinal stenosis at L2-3 through L4-5 with no convincing lateral recess or foraminal stenosis. She continued to report burning pain in her legs and was started on low dose gabapentin 300mg  qhs with no side effects. Dilantin was held in the hospital, she was discharged on a lower dose, but states that she has been taking her pre-hospital dose of 160mg  in AM, 200mg  in PM but has had 4 seizures since hospital discharge 2 weeks ago, which is unusual. She had 2 seizures in one day in the car with her mother-in-law, her mother-in-law had to pull over. The next day she had 2 at home, her daughter found her in the bathroom. She has not been sleeping well the past few days. She has a headache after the seizures. Aside from the convulsions, she has episodes where a feeling comes over her left side, from the arm down her leg, then she is weaker after. She denies any jerking. She is quite anxious in the office today and perseverates on her symptoms. She states she "stays nervous." She reports taking prn Ativan in the past, but discontinued at [facility]. She repeatedly states her feet are numb and they "feel like they are grabbed." She feels gabapentin has helped some with this.    Prior AEDs: phenobarbital (ineffective, mood issues), Depakote (multiple trials, ineffective), Keppra (ineffective), Zonisamide (side effects on brief trial of higher dose than prescribed - 500mg  instead of 200mg ), Onfi (mouth sores), Tegretol (ineffective), Lamictal (nausea, dizziness, malaise),  Felbatol (nausea, dizziness, malaise), Topamax (ineffective), Tigabine (tried per notes, details unclear), Neurontin (ineffective, nausea), Lyrica (leg pain reported on 25mg  daily, tingling), Briviact (drowsiness), Fycompa   Epilepsy Risk Factors:  Right parieto-occipital AVM s/p rupture and hemorrhage in 1988, s/p craniectomy. Otherwise she had a normal birth and early development.  There is no history of febrile convulsions, CNS infections such as meningitis/encephalitis, or family history of seizures.  Diagnostic Data:  MRI brain without contrast 05/2018 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  EEG done 06/2018 showed right posterior temporal slowing, occasional epileptiform discharges over the right posterior temporal region, breach artifact in this region.   MRI cervical, thoracic, and lumbar spine done 07/2018 showed no convincing cervical or thoracic spinal cord abnormality. Mild degenerative cervical spinal stenosis at C5-6 and C6-7 with moderate or severe bilateral foraminal stenosis; no thoracic spinal or foraminal stenosis; stable borderline to mild lumbar spinal stenosis  MRI lumbar spine in 2019 showed borderline to mild multifactorial spinal stenosis at L2-L3 and L3-L4 related to circumferential disc bulge and posterior element Hypertrophy.  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    PAST MEDICAL HISTORY: Past Medical History:  Diagnosis Date   Asthma    Cerebral hemorrhage ([place]) 1989   Chronic back pain    COPD (chronic obstructive pulmonary disease) ([place])  DDD (degenerative disc disease) 06/12/2013   DVT (deep venous thrombosis) ([facility])    "she's had several since 1990"   GERD (gastroesophageal reflux disease)    Headache    "usually around time when she's had a seizure"   History of blood  transfusion    "when she was a baby"   History of stomach ulcers    Kidney stones    Neuropathy    Seizures ([place])    "because of her brain surgery"   Stroke [facility]) 1990's   family denies residual on 11/09/2014   Tunnel vision    "since brain OR"    MEDICATIONS: Current Outpatient Medications on File Prior to Visit  Medication Sig Dispense Refill   acetaminophen (TYLENOL) 500 MG tablet Take 1,000 mg by mouth every 6 (six) hours as needed for mild pain.     albuterol (VENTOLIN HFA) 108 (90 Base) MCG/ACT inhaler Inhale 1 puff into the lungs every 4 (four) hours as needed for wheezing or shortness of breath.      Cenobamate (XCOPRI) 100 MG TABS Take 1 tablet by mouth daily. 90 tablet 3   cholecalciferol (VITAMIN D3) 25 MCG (1000 UNIT) tablet Take 1 tablet (1,000 Units total) by mouth daily. 90 tablet 3   DILANTIN 100 MG ER capsule Take 1 capsule twice a day 180 capsule 3   DILANTIN 30 MG ER capsule Take 1 capsule every night (take with 100mg  capsule for total of 130mg  every night) 90 capsule 3   escitalopram (LEXAPRO) 10 MG tablet Take 1 tablet (10 mg total) by mouth at bedtime. 90 tablet 3   gabapentin (NEURONTIN) 300 MG capsule Take 1 capsule every night 90 capsule 3   lacosamide (VIMPAT) 200 MG TABS tablet Take 1 tablet (200 mg total) by mouth 2 (two) times daily. 180 tablet 5   LORazepam (ATIVAN) 1 MG tablet Take 1 tablet (1 mg total) by mouth See admin instructions. Take 1 mg by mouth as directed/as needed for 3 or more seizures/24 hours-  Do not take more than 2 mg in 24 hours     [facility]S B-12 500 MCG tablet TAKE 2 TABLETS BY MOUTH EVERY DAY (Patient not taking: Reported on 01/28/2021) 100 tablet 0   No current facility-administered medications on file prior to visit.    ALLERGIES: Allergies  Allergen Reactions   Zonisamide Other (See Comments)    Numbness and tingling over whole body   Chocolate Flavor Other (See Comments)    Trigger for seizures   Codeine Nausea And Vomiting    Keppra [Levetiracetam] Other (See Comments)    Causes seizures   Pregabalin Nausea And Vomiting   Olive Oil Rash    FAMILY HISTORY: Family History  Problem Relation Age of Onset   Heart attack Mother        4 MIs, started in her 23s, also vaginal cancer and colon cancer   Cancer Mother    Heart attack Father        Died suddenly age 102 - autopsy revealed heart attack her patient   Cancer Brother        Sinus cancer    SOCIAL HISTORY: Social History   Socioeconomic History   Marital status: Divorced    Spouse name: Not on file   Number of children: 3   Years of education: Not on file   Highest education level: Not on file  Occupational History   Not on file  Tobacco Use   Smoking status: Former  Packs/day: 0.50    Years: 20.00    Pack years: 10.00    Types: Cigarettes    Quit date: 10/26/2017    Years since quitting: 3.2   Smokeless tobacco: Never   Tobacco comments:    Previously smoked 2 ppd, down to 4 cigs/day  Vaping Use   Vaping Use: Never used  Substance and Sexual Activity   Alcohol use: No   Drug use: No   Sexual activity: Never    Birth control/protection: Post-menopausal  Other Topics Concern   Not on file  Social History Narrative   Pt lives in 2 story home with her daughter, daughter's family and pt's mother-in-law   Has 2 living children / 1 deceased   Oct 26, 2022 grade education   Worked for [employer] until 1998 when she became disabled.    Right handed   Social Determinants of Health   Financial Resource Strain: Not on file  Food Insecurity: Not on file  Transportation Needs: Not on file  Physical Activity: Not on file  Stress: Not on file  Social Connections: Not on file  Intimate Partner Violence: Not on file     PHYSICAL EXAM: Vitals:   01/28/21 0806  BP: 125/67  Pulse: 80  SpO2: 94%   General: No acute distress Head:  Normocephalic/atraumatic. Torticollis again noted with head bent to the right, side to side head  tremor Skin/Extremities: No rash, no edema Neurological Exam: alert and awake. No aphasia or dysarthria. Fund of knowledge is appropriate.  Attention and concentration are normal.   Cranial nerves: Pupils equal, round. Extraocular movements intact with no nystagmus. Visual fields full.  No facial asymmetry.  Motor: Bulk and tone normal, muscle strength 5/5 throughout with no pronator drift.   Finger to nose testing intact.  Gait slightly wide-based, no ataxia.    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 She has tried multiple AEDs in the past with various side effects. She has had several admissions for Dilantin toxicity and cannot tolerate higher doses of Dilantin. She is doing well on Dilantin 100mg  in AM, 130mg  in PM. We discussed that moving forward, we would avoid increasing dose in the event of subtherapeutic levels. Adjustments may be made if level is supratherapeutic and she is symptomatic, otherwise we have agreed to stay on this dose. Continue Vimpat 200mg  BID, Xcopri 100mg  daily. Continue gabapentin 300mg  qhs for neuropathy and Lexapro 10mg  daily for mood. She is reporting worsening head tremor from cervical dystonia, we will look into revisiting Botox. She does not drive. Follow-up in 4-5 months, call for any changes.   Thank you for allowing me to participate in her care.  Please do not hesitate to call for any questions or concerns.   Ellouise Newer, M.D.   CC: Dr. Maudie Mercury

## 2021-01-28 NOTE — Patient Instructions (Signed)
Good to see you! Continue all your medications. Follow-up in 4-5 months, call for any changes.   Seizure Precautions: 1. If medication has been prescribed for you to prevent seizures, take it exactly as directed.  Do not stop taking the medicine without talking to your doctor first, even if you have not had a seizure in a long time.   2. Avoid activities in which a seizure would cause danger to yourself or to others.  Don't operate dangerous machinery, swim alone, or climb in high or dangerous places, such as on ladders, roofs, or girders.  Do not drive unless your doctor says you may.  3. If you have any warning that you may have a seizure, lay down in a safe place where you can't hurt yourself.    4.  No driving for 6 months from last seizure, as per [facility].   Please refer to the following link on the [place] website for more information: http://[email or web address]/[email or web address]/[email or web address]   5.  Maintain good sleep hygiene.  6.  Contact your doctor if you have any problems that may be related to the medicine you are taking.  7.  Call 911 and bring the patient back to the ED if:        A.  The seizure lasts longer than 5 minutes.       B.  The patient doesn't awaken shortly after the seizure  C.  The patient has new problems such as difficulty seeing, speaking or moving  D.  The patient was injured during the seizure  E.  The patient has a temperature over 102 F (39C)  F.  The patient vomited and now is having trouble breathing

## 2021-02-09 ENCOUNTER — Encounter (HOSPITAL_COMMUNITY): Payer: Self-pay | Admitting: Oncology

## 2021-02-09 ENCOUNTER — Emergency Department (HOSPITAL_COMMUNITY)
Admission: EM | Admit: 2021-02-09 | Discharge: 2021-02-09 | Disposition: A | Discharge status: Home / Self Care | Payer: Medicare Other | Source: Home / Self Care | Attending: Emergency Medicine | Admitting: Emergency Medicine

## 2021-02-09 ENCOUNTER — Emergency Department (HOSPITAL_COMMUNITY): Payer: Medicare Other

## 2021-02-09 ENCOUNTER — Other Ambulatory Visit: Payer: Self-pay

## 2021-02-09 DIAGNOSIS — J449 Chronic obstructive pulmonary disease, unspecified: Secondary | ICD-10-CM | POA: Diagnosis not present

## 2021-02-09 DIAGNOSIS — Z20822 Contact with and (suspected) exposure to covid-19: Secondary | ICD-10-CM | POA: Diagnosis not present

## 2021-02-09 DIAGNOSIS — Z8616 Personal history of COVID-19: Secondary | ICD-10-CM | POA: Diagnosis not present

## 2021-02-09 DIAGNOSIS — R569 Unspecified convulsions: Secondary | ICD-10-CM

## 2021-02-09 DIAGNOSIS — G40919 Epilepsy, unspecified, intractable, without status epilepticus: Secondary | ICD-10-CM | POA: Diagnosis not present

## 2021-02-09 DIAGNOSIS — E876 Hypokalemia: Secondary | ICD-10-CM | POA: Diagnosis not present

## 2021-02-09 DIAGNOSIS — Z86718 Personal history of other venous thrombosis and embolism: Secondary | ICD-10-CM | POA: Diagnosis not present

## 2021-02-09 DIAGNOSIS — D7589 Other specified diseases of blood and blood-forming organs: Secondary | ICD-10-CM | POA: Diagnosis not present

## 2021-02-09 DIAGNOSIS — E860 Dehydration: Secondary | ICD-10-CM | POA: Diagnosis not present

## 2021-02-09 DIAGNOSIS — I69354 Hemiplegia and hemiparesis following cerebral infarction affecting left non-dominant side: Secondary | ICD-10-CM | POA: Diagnosis not present

## 2021-02-09 DIAGNOSIS — M25562 Pain in left knee: Secondary | ICD-10-CM | POA: Diagnosis not present

## 2021-02-09 DIAGNOSIS — Z79899 Other long term (current) drug therapy: Secondary | ICD-10-CM | POA: Diagnosis not present

## 2021-02-09 DIAGNOSIS — B9689 Other specified bacterial agents as the cause of diseases classified elsewhere: Secondary | ICD-10-CM | POA: Insufficient documentation

## 2021-02-09 DIAGNOSIS — N390 Urinary tract infection, site not specified: Secondary | ICD-10-CM | POA: Insufficient documentation

## 2021-02-09 DIAGNOSIS — Z87891 Personal history of nicotine dependence: Secondary | ICD-10-CM | POA: Insufficient documentation

## 2021-02-09 DIAGNOSIS — I480 Paroxysmal atrial fibrillation: Secondary | ICD-10-CM | POA: Diagnosis not present

## 2021-02-09 DIAGNOSIS — R251 Tremor, unspecified: Secondary | ICD-10-CM | POA: Diagnosis not present

## 2021-02-09 DIAGNOSIS — J441 Chronic obstructive pulmonary disease with (acute) exacerbation: Secondary | ICD-10-CM | POA: Insufficient documentation

## 2021-02-09 DIAGNOSIS — Z95828 Presence of other vascular implants and grafts: Secondary | ICD-10-CM | POA: Diagnosis not present

## 2021-02-09 DIAGNOSIS — G319 Degenerative disease of nervous system, unspecified: Secondary | ICD-10-CM | POA: Diagnosis not present

## 2021-02-09 DIAGNOSIS — M549 Dorsalgia, unspecified: Secondary | ICD-10-CM | POA: Diagnosis not present

## 2021-02-09 DIAGNOSIS — J45909 Unspecified asthma, uncomplicated: Secondary | ICD-10-CM | POA: Insufficient documentation

## 2021-02-09 DIAGNOSIS — R262 Difficulty in walking, not elsewhere classified: Secondary | ICD-10-CM | POA: Diagnosis not present

## 2021-02-09 DIAGNOSIS — K219 Gastro-esophageal reflux disease without esophagitis: Secondary | ICD-10-CM | POA: Diagnosis not present

## 2021-02-09 DIAGNOSIS — D696 Thrombocytopenia, unspecified: Secondary | ICD-10-CM | POA: Diagnosis not present

## 2021-02-09 DIAGNOSIS — R410 Disorientation, unspecified: Secondary | ICD-10-CM | POA: Diagnosis not present

## 2021-02-09 DIAGNOSIS — R404 Transient alteration of awareness: Secondary | ICD-10-CM | POA: Diagnosis not present

## 2021-02-09 DIAGNOSIS — R6889 Other general symptoms and signs: Secondary | ICD-10-CM | POA: Diagnosis not present

## 2021-02-09 DIAGNOSIS — Z8711 Personal history of peptic ulcer disease: Secondary | ICD-10-CM | POA: Diagnosis not present

## 2021-02-09 DIAGNOSIS — G9389 Other specified disorders of brain: Secondary | ICD-10-CM | POA: Diagnosis not present

## 2021-02-09 DIAGNOSIS — Z743 Need for continuous supervision: Secondary | ICD-10-CM | POA: Diagnosis not present

## 2021-02-09 DIAGNOSIS — D539 Nutritional anemia, unspecified: Secondary | ICD-10-CM | POA: Diagnosis not present

## 2021-02-09 DIAGNOSIS — G8929 Other chronic pain: Secondary | ICD-10-CM | POA: Diagnosis not present

## 2021-02-09 DIAGNOSIS — M79675 Pain in left toe(s): Secondary | ICD-10-CM | POA: Diagnosis not present

## 2021-02-09 DIAGNOSIS — G629 Polyneuropathy, unspecified: Secondary | ICD-10-CM | POA: Diagnosis not present

## 2021-02-09 DIAGNOSIS — M25552 Pain in left hip: Secondary | ICD-10-CM | POA: Diagnosis not present

## 2021-02-09 LAB — COMPREHENSIVE METABOLIC PANEL
ALT: 18 U/L (ref 0–44)
AST: 29 U/L (ref 15–41)
Albumin: 4.1 g/dL (ref 3.5–5.0)
Alkaline Phosphatase: 58 U/L (ref 38–126)
Anion gap: 8 (ref 5–15)
BUN: 12 mg/dL (ref 8–23)
CO2: 27 mmol/L (ref 22–32)
Calcium: 9 mg/dL (ref 8.9–10.3)
Chloride: 105 mmol/L (ref 98–111)
Creatinine, Ser: 0.75 mg/dL (ref 0.44–1.00)
GFR, Estimated: >60 mL/min (ref >60)
Glucose, Bld: 113 mg/dL — ABNORMAL HIGH (ref 70–99)
Potassium: 3.9 mmol/L (ref 3.5–5.1)
Sodium: 140 mmol/L (ref 135–145)
Total Bilirubin: 0.7 mg/dL (ref 0.3–1.2)
Total Protein: 7.1 g/dL (ref 6.5–8.1)

## 2021-02-09 LAB — CBC WITH DIFFERENTIAL/PLATELET
Abs Immature Granulocytes: 0.02 10*3/uL (ref 0.00–0.07)
Basophils Absolute: 0 10*3/uL (ref 0.0–0.1)
Basophils Relative: 0 %
Eosinophils Absolute: 0 10*3/uL (ref 0.0–0.5)
Eosinophils Relative: 0 %
HCT: 39.7 % (ref 36.0–46.0)
Hemoglobin: 12.9 g/dL (ref 12.0–15.0)
Immature Granulocytes: 0 %
Lymphocytes Relative: 16 %
Lymphs Abs: 1.1 10*3/uL (ref 0.7–4.0)
MCH: 33.7 pg (ref 26.0–34.0)
MCHC: 32.5 g/dL (ref 30.0–36.0)
MCV: 103.7 fL — ABNORMAL HIGH (ref 80.0–100.0)
Monocytes Absolute: 0.3 10*3/uL (ref 0.1–1.0)
Monocytes Relative: 5 %
Neutro Abs: 5.1 10*3/uL (ref 1.7–7.7)
Neutrophils Relative %: 79 %
Platelets: 149 10*3/uL — ABNORMAL LOW (ref 150–400)
RBC: 3.83 MIL/uL — ABNORMAL LOW (ref 3.87–5.11)
RDW: 13.1 % (ref 11.5–15.5)
WBC: 6.5 10*3/uL (ref 4.0–10.5)
nRBC: 0 % (ref 0.0–0.2)

## 2021-02-09 LAB — URINALYSIS, ROUTINE W REFLEX MICROSCOPIC
Bilirubin Urine: NEGATIVE
Glucose, UA: NEGATIVE mg/dL
Hgb urine dipstick: NEGATIVE
Ketones, ur: NEGATIVE mg/dL
Leukocytes,Ua: NEGATIVE
Nitrite: POSITIVE — AB
Protein, ur: NEGATIVE mg/dL
Specific Gravity, Urine: 1.006 (ref 1.005–1.030)
pH: 6 (ref 5.0–8.0)

## 2021-02-09 LAB — ETHANOL: Alcohol, Ethyl (B): <10 mg/dL (ref <10)

## 2021-02-09 LAB — PHENYTOIN LEVEL, TOTAL: Phenytoin Lvl: 8.4 ug/mL — ABNORMAL LOW (ref 10.0–20.0)

## 2021-02-09 MED ORDER — SODIUM CHLORIDE 0.9 % IV SOLN
1.0000 g | Freq: Once | INTRAVENOUS | Status: AC
  Administered 2021-02-09: 1 g via INTRAVENOUS
  Filled 2021-02-09: qty 10

## 2021-02-09 MED ORDER — SODIUM CHLORIDE 0.9 % IV BOLUS
1000.0000 mL | Freq: Once | INTRAVENOUS | Status: AC
  Administered 2021-02-09: 1000 mL via INTRAVENOUS

## 2021-02-09 MED ORDER — CEPHALEXIN 500 MG PO CAPS: 500.0000 mg | ORAL_CAPSULE | Freq: Two times a day (BID) | ORAL | 0 refills | Status: DC

## 2021-02-09 MED ORDER — SODIUM CHLORIDE 0.9 % IV SOLN
1000.0000 mg | Freq: Once | INTRAVENOUS | Status: AC
  Administered 2021-02-09: 1000 mg via INTRAVENOUS
  Filled 2021-02-09: qty 20

## 2021-02-09 NOTE — ED Triage Notes (Signed)
Pt bib [facility] from home s/p 4 seizures in the last 24 hours.  Pt had a seizure en route to hospital, EMS administered 5 mg versed to left deltoid.  Family reports pt being compliant with medication as well as pt having hx of high dilantin levels which caused additional seizures.

## 2021-02-09 NOTE — ED Notes (Signed)
Pt verbalized understanding of d/c, medication, and follow up care.

## 2021-02-09 NOTE — Discharge Instructions (Addendum)
As discussed, it is more that he follow-up with your physician this week for repeat evaluation of your Dilantin level.  You have been diagnosed with seizure, and this may be occurring due to some evidence for urinary tract infection and low levels of your Dilantin.  Please continue taking the antibiotics as prescribed as well.  Return here for concerning changes in your condition.

## 2021-02-09 NOTE — ED Provider Notes (Signed)
[facility] DEPT Provider Note   CSN: [id number] Arrival date & time: 02/09/21  1414     History Chief Complaint  Patient presents with   Seizures    [name] is a 65 y.o. female.  HPI Patient presents via EMS after a series of witnessed seizures.  On arrival the patient is postictal, incapable of providing substantial details of her presentation, level 5 caveat secondary to mental status change. Reported the patient has multiple medical issues including seizure disorder.  Compliance is noted to be good, though she has had episodes of hypertherapeutic Dilantin levels reportedly. Over the past day patient has had possibly for seizures, without substantial trauma after any of them.  EMS reports 1 seizure episode lasting about 30 seconds in route with classic tonic, clonic like activity.  Patient received 5 mg of midazolam.  No additional seizure activity.    Past Medical History:  Diagnosis Date   Asthma    Cerebral hemorrhage ([place]) 1989   Chronic back pain    COPD (chronic obstructive pulmonary disease) (HCC)    DDD (degenerative disc disease) 06/12/2013   DVT (deep venous thrombosis) ([place])    "she's had several since 1990"   GERD (gastroesophageal reflux disease)    Headache    "usually around time when she's had a seizure"   History of blood transfusion    "when she was a baby"   History of stomach ulcers    Kidney stones    Neuropathy    Seizures ([place])    "because of her brain surgery"   Stroke [facility]) 1990's   family denies residual on 11/09/2014   Tunnel vision    "since brain OR"    Patient Active Problem List   Diagnosis Date Noted   DVT (deep venous thrombosis) ([place]) 10/10/2020   Frequent falls 10/10/2020   Dysuria 10/10/2020   PAF (paroxysmal atrial fibrillation) ([place]) 10/10/2020   Fall 08/02/2020   Atrial flutter ([place])    COVID-19 virus infection 04/02/2020   Lactic acidosis 04/02/2020   Ataxia 08/26/2019   Elevated  Dilantin level 08/26/2019   Closed fracture of proximal phalanx of great toe 01/04/2019   Closed fracture of fifth metatarsal bone 01/04/2019   Pain in left foot 01/04/2019   Altered mental status    Weakness of both lower extremities    Generalized weakness 05/25/2018   History of CVA (cerebrovascular accident)    Pain    Chronic back pain    Neuropathy    Orthostasis    Tobacco abuse    Left-sided weakness 03/19/2018   Atypical chest pain 02/17/2017   Localized swelling of lower extremity 02/17/2017   GERD (gastroesophageal reflux disease) 02/17/2017   Costochondritis 02/17/2017   Seizures ([place]) 11/02/2015   Gait disturbance 11/02/2015   Right clavicle fracture 11/02/2015   Urinary tract infection associated with catheterization of urinary tract, initial encounter ([place]) 11/02/2015   Chest pain 03/31/2015   Acute respiratory failure with hypoxia ([place]) 03/31/2015   SOB (shortness of breath) 03/31/2015   Malnutrition of moderate degree ([place]) 03/31/2015   COPD with exacerbation ([place]) 03/30/2015   Hypoxia 01/18/2015   Chronic obstructive pulmonary disease with acute exacerbation ([place])    Fever 01/17/2015   Cough 01/17/2015   Sepsis secondary to UTI ([place]) 01/17/2015   COPD (chronic obstructive pulmonary disease) ([place])    Unintentional weight loss 01/11/2015   Anxiety 10/22/2014   Asthma 05/15/2014   Seizure ([place]) 05/15/2014   Protein calorie malnutrition (  [employer]) 05/15/2014   Arteriovenous malformation of brain 11/17/2013   Partial epilepsy with impairment of consciousness, 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   Stroke due to intracerebral hemorrhage ([place]) 11/17/2013   DVT of leg (deep venous thrombosis) ([place]) 06/19/2013   DDD (degenerative disc disease) 06/12/2013   Paresthesias 06/12/2013   Tremor 06/09/2013   Leukopenia 12/14/2012   Thrombocytopenia- chronic 06/29/2012   Noncompliance with medication treatment due to underuse of medication 06/29/2012   Stress-personal 06/29/2012    Recurrent seizures ([place]) 06/29/2012   Tobacco dependence 06/28/2012   Skull fracture ([place]) 02/28/2012   Dilantin toxicity 02/28/2012   Seizure disorder ([place]) 02/28/2012   History of Intracranial bleed 02/28/2012    Past Surgical History:  Procedure Laterality Date   [place]   craniotomy with hematoma evacuation   [place]; 1987; [place]     IVC FILTER INSERTION N/A 10/12/2020   Procedure: IVC FILTER INSERTION;  Surgeon: Angelia Mould, MD;  Location: [facility];  Service: Cardiovascular;  Laterality: N/A;   TUBAL LIGATION  1990's     OB History   No obstetric history on file.     Family History  Problem Relation Age of Onset   Heart attack Mother        4 MIs, started in her 48s, also vaginal cancer and colon cancer   Cancer Mother    Heart attack Father        Died suddenly age 94 - autopsy revealed heart attack her patient   Cancer Brother        Sinus cancer    Social History   Tobacco Use   Smoking status: Former    Packs/day: 0.50    Years: 20.00    Pack years: 10.00    Types: Cigarettes    Quit date: 10/26/2017    Years since quitting: 3.2   Smokeless tobacco: Never   Tobacco comments:    Previously smoked 2 ppd, down to 4 cigs/day  Vaping Use   Vaping Use: Never used  Substance Use Topics   Alcohol use: No   Drug use: No    Home Medications Prior to Admission medications   Medication Sig Start Date End Date Taking? Authorizing Provider  acetaminophen (TYLENOL) 500 MG tablet Take 1,000 mg by mouth every 6 (six) hours as needed for headache.   Yes [provider]  Cenobamate (XCOPRI) 100 MG TABS Take 1 tablet by mouth daily. 01/28/21  Yes [name], MD  cephALEXin (KEFLEX) 500 MG capsule Take 1 capsule (500 mg total) by mouth 2 (two) times daily for 5 days. 02/09/21 02/14/21 Yes Carmin Muskrat, MD  DILANTIN 100 MG ER capsule Take 1 capsule twice a day Patient taking  differently: Take 100 mg by mouth 2 (two) times daily. Also take 30 mg capsule with bedtime dose for a total nightly dose of 130 mg 01/28/21  Yes [name], MD  DILANTIN 30 MG ER capsule Take 1 capsule every night (take with 100mg  capsule for total of 130mg  every night) Patient taking differently: Take 30 mg by mouth at bedtime. Take with a 100 mg capsule for a total nightly dose of 130 mg 01/28/21  Yes [name], MD  escitalopram (LEXAPRO) 10 MG tablet Take 1 tablet (10 mg total) by mouth at bedtime. 01/28/21  Yes [name], MD  gabapentin (NEURONTIN) 300 MG capsule Take 1 capsule every night Patient taking differently: Take 300 mg  by mouth at bedtime. 01/28/21  Yes [name], MD  lacosamide (VIMPAT) 200 MG TABS tablet Take 1 tablet (200 mg total) by mouth 2 (two) times daily. 01/28/21  Yes [name], MD  LORazepam (ATIVAN) 1 MG tablet Take 1 tablet (1 mg total) by mouth See admin instructions. Take 1 mg by mouth as directed/as needed for 3 or more seizures/24 hours-  Do not take more than 2 mg in 24 hours 01/28/21  Yes [name], MD  cholecalciferol (VITAMIN D3) 25 MCG (1000 UNIT) tablet Take 1 tablet (1,000 Units total) by mouth daily. Patient not taking: No sig reported 01/24/20   [name], MD  [employer] B-12 500 MCG tablet TAKE 2 TABLETS BY MOUTH EVERY DAY Patient not taking: No sig reported 01/24/20   [name], MD    Allergies    Zonisamide, Chocolate flavor, Codeine, Keppra [levetiracetam], Pregabalin, and Olive oil  Review of Systems   Review of Systems  Unable to perform ROS: Mental status change   Physical Exam Updated Vital Signs BP 137/65   Pulse 89   Temp 99 F (37.2 C) (Oral)   Resp 16   SpO2 90%   Physical Exam Vitals and nursing note reviewed.  Constitutional:      General: She is not in acute distress.    Appearance: She is well-developed. She is ill-appearing.     Comments: Essentially noninteractive female responding only to  stimuli, frail in appearance  HENT:     Head: Normocephalic and atraumatic.  Eyes:     Conjunctiva/sclera: Conjunctivae normal.  Cardiovascular:     Rate and Rhythm: Normal rate and regular rhythm.  Pulmonary:     Effort: Pulmonary effort is normal. No respiratory distress.     Breath sounds: Normal breath sounds. No stridor.  Abdominal:     General: There is no distension.  Skin:    General: Skin is warm and dry.  Neurological:     Mental Status: She is alert.     Cranial Nerves: No cranial nerve deficit.     Comments: Patient responds to stimuli, moves all extremities spontaneously, but does not respond to verbal commands, is not awake, alert.  Psychiatric:     Comments: Noninteractive    ED Results / Procedures / Treatments   Labs (all labs ordered are listed, but only abnormal results are displayed) Labs Reviewed  COMPREHENSIVE METABOLIC PANEL - Abnormal; Notable for the following components:      Result Value   Glucose, Bld 113 (*)    All other components within normal limits  CBC WITH DIFFERENTIAL/PLATELET - Abnormal; Notable for the following components:   RBC 3.83 (*)    MCV 103.7 (*)    Platelets 149 (*)    All other components within normal limits  URINALYSIS, ROUTINE W REFLEX MICROSCOPIC - Abnormal; Notable for the following components:   Nitrite POSITIVE (*)    Bacteria, UA MANY (*)    All other components within normal limits  PHENYTOIN LEVEL, TOTAL - Abnormal; Notable for the following components:   Phenytoin Lvl 8.4 (*)    All other components within normal limits  ETHANOL    EKG None  Radiology DG Knee Complete 4 Views Left  Result Date: 02/09/2021 CLINICAL DATA:  Recent fall following seizure activity with left knee pain, initial encounter EXAM: LEFT KNEE - COMPLETE 4+ VIEW COMPARISON:  None. FINDINGS: No evidence of fracture, dislocation, or joint effusion. No evidence of arthropathy or  other focal bone abnormality. Soft tissues are unremarkable.  IMPRESSION: No acute abnormality noted. Electronically Signed   By: Inez Catalina M.D.   On: 02/09/2021 20:06   DG Hip Unilat With Pelvis 2-3 Views Left  Result Date: 02/09/2021 CLINICAL DATA:  Recent seizure activity with fall and left hip pain, initial encounter EXAM: DG HIP (WITH OR WITHOUT PELVIS) 3V LEFT COMPARISON:  None. FINDINGS: Pelvic ring is intact. Minimal degenerative changes of the hip joints are noted bilaterally. No acute fracture or dislocation is noted. No soft tissue abnormality is seen. IMPRESSION: No acute abnormality noted. Electronically Signed   By: Inez Catalina M.D.   On: 02/09/2021 20:07    Procedures Procedures   Medications Ordered in ED Medications  sodium chloride 0.9 % bolus 1,000 mL (0 mLs Intravenous Stopped 02/09/21 1800)  phenytoin (DILANTIN) 1,000 mg in sodium chloride 0.9 % 250 mL IVPB (0 mg Intravenous Stopped 02/09/21 1800)  cefTRIAXone (ROCEPHIN) 1 g in sodium chloride 0.9 % 100 mL IVPB (0 g Intravenous Stopped 02/09/21 1903)    ED Course  I have reviewed the triage vital signs and the nursing notes.  Pertinent labs & imaging results that were available during my care of the patient were reviewed by me and considered in my medical decision making (see chart for details).  Patient placed on continuous cardiac monitoring, pulse oximetry. Cardiac 70s sinus normal Pulse ox 97% room air normal   Exam the patient is accompanied by her son.  He and I discussed possibilities for her seizures including urinary tract infection which patient's daughter suspicious, as well as subtherapeutic Dilantin level, which she has had previously.   9:11 PM Patient awake, alert, has received Dilantin loading, ceftriaxone, fluids, has had no additional seizure activity.  She and I discussed all findings including subtherapeutic Dilantin level, x-ray reassuring without fracture in her hip, knee, left side which she described as painful after she awake and following a postictal  phase. Without other complaints, without additional seizure activity, suspicion for subtherapeutic Dilantin level contributing to today's episode she is discharged in stable condition to follow-up next week with her primary care physician. MDM Rules/Calculators/A&P MDM Number of Diagnoses or Management Options Lower urinary tract infectious disease: new, needed workup Seizure ([place]): established, worsening   Amount and/or Complexity of Data Reviewed Clinical lab tests: reviewed and ordered Tests in the radiology section of CPT: ordered and reviewed Tests in the medicine section of CPT: ordered and reviewed Decide to obtain previous medical records or to obtain history from someone other than the patient: yes Obtain history from someone other than the patient: yes Review and summarize past medical records: yes Independent visualization of images, tracings, or specimens: yes  Risk of Complications, Morbidity, and/or Mortality Presenting problems: high Diagnostic procedures: high Management options: high  Critical Care Total time providing critical care: < 30 minutes  Patient Progress Patient progress: stable   Final Clinical Impression(s) / ED Diagnoses Final diagnoses:  Seizure ([place])  Lower urinary tract infectious disease    Rx / DC Orders ED Discharge Orders          Ordered    cephALEXin (KEFLEX) 500 MG capsule  2 times daily        02/09/21 2109             Carmin Muskrat, MD 02/09/21 2112

## 2021-02-09 NOTE — ED Notes (Signed)
[place], RN to bedside to assist w/ ultrasound guided IV and blood collection.

## 2021-02-11 ENCOUNTER — Other Ambulatory Visit: Payer: Self-pay

## 2021-02-11 ENCOUNTER — Inpatient Hospital Stay (HOSPITAL_COMMUNITY)
Admission: EM | Admit: 2021-02-11 | Discharge: 2021-02-14 | DRG: 101 | Disposition: A | Discharge status: Home Health | Payer: Medicare Other | Attending: Internal Medicine | Admitting: Internal Medicine

## 2021-02-11 ENCOUNTER — Emergency Department (HOSPITAL_COMMUNITY): Payer: Medicare Other

## 2021-02-11 ENCOUNTER — Encounter (HOSPITAL_COMMUNITY): Payer: Self-pay | Admitting: *Deleted

## 2021-02-11 DIAGNOSIS — E876 Hypokalemia: Secondary | ICD-10-CM | POA: Diagnosis not present

## 2021-02-11 DIAGNOSIS — Z885 Allergy status to narcotic agent status: Secondary | ICD-10-CM

## 2021-02-11 DIAGNOSIS — Z95828 Presence of other vascular implants and grafts: Secondary | ICD-10-CM | POA: Diagnosis not present

## 2021-02-11 DIAGNOSIS — G40919 Epilepsy, unspecified, intractable, without status epilepticus: Secondary | ICD-10-CM | POA: Diagnosis present

## 2021-02-11 DIAGNOSIS — Z8249 Family history of ischemic heart disease and other diseases of the circulatory system: Secondary | ICD-10-CM

## 2021-02-11 DIAGNOSIS — M549 Dorsalgia, unspecified: Secondary | ICD-10-CM | POA: Diagnosis present

## 2021-02-11 DIAGNOSIS — R569 Unspecified convulsions: Secondary | ICD-10-CM

## 2021-02-11 DIAGNOSIS — Z87442 Personal history of urinary calculi: Secondary | ICD-10-CM

## 2021-02-11 DIAGNOSIS — Z8616 Personal history of COVID-19: Secondary | ICD-10-CM | POA: Diagnosis not present

## 2021-02-11 DIAGNOSIS — D696 Thrombocytopenia, unspecified: Secondary | ICD-10-CM | POA: Diagnosis present

## 2021-02-11 DIAGNOSIS — I619 Nontraumatic intracerebral hemorrhage, unspecified: Secondary | ICD-10-CM | POA: Diagnosis not present

## 2021-02-11 DIAGNOSIS — G629 Polyneuropathy, unspecified: Secondary | ICD-10-CM | POA: Diagnosis present

## 2021-02-11 DIAGNOSIS — G8929 Other chronic pain: Secondary | ICD-10-CM | POA: Diagnosis present

## 2021-02-11 DIAGNOSIS — J449 Chronic obstructive pulmonary disease, unspecified: Secondary | ICD-10-CM | POA: Diagnosis not present

## 2021-02-11 DIAGNOSIS — G40909 Epilepsy, unspecified, not intractable, without status epilepticus: Secondary | ICD-10-CM | POA: Diagnosis not present

## 2021-02-11 DIAGNOSIS — I69354 Hemiplegia and hemiparesis following cerebral infarction affecting left non-dominant side: Secondary | ICD-10-CM

## 2021-02-11 DIAGNOSIS — Z9851 Tubal ligation status: Secondary | ICD-10-CM | POA: Diagnosis not present

## 2021-02-11 DIAGNOSIS — R251 Tremor, unspecified: Secondary | ICD-10-CM | POA: Diagnosis present

## 2021-02-11 DIAGNOSIS — N390 Urinary tract infection, site not specified: Secondary | ICD-10-CM | POA: Diagnosis present

## 2021-02-11 DIAGNOSIS — I480 Paroxysmal atrial fibrillation: Secondary | ICD-10-CM | POA: Diagnosis not present

## 2021-02-11 DIAGNOSIS — Z87891 Personal history of nicotine dependence: Secondary | ICD-10-CM

## 2021-02-11 DIAGNOSIS — Z86718 Personal history of other venous thrombosis and embolism: Secondary | ICD-10-CM

## 2021-02-11 DIAGNOSIS — D539 Nutritional anemia, unspecified: Secondary | ICD-10-CM | POA: Diagnosis present

## 2021-02-11 DIAGNOSIS — Z20822 Contact with and (suspected) exposure to covid-19: Secondary | ICD-10-CM | POA: Diagnosis present

## 2021-02-11 DIAGNOSIS — G9389 Other specified disorders of brain: Secondary | ICD-10-CM | POA: Diagnosis not present

## 2021-02-11 DIAGNOSIS — Z87440 Personal history of urinary (tract) infections: Secondary | ICD-10-CM

## 2021-02-11 DIAGNOSIS — D7589 Other specified diseases of blood and blood-forming organs: Secondary | ICD-10-CM | POA: Diagnosis present

## 2021-02-11 DIAGNOSIS — Z8711 Personal history of peptic ulcer disease: Secondary | ICD-10-CM | POA: Diagnosis not present

## 2021-02-11 DIAGNOSIS — G319 Degenerative disease of nervous system, unspecified: Secondary | ICD-10-CM | POA: Diagnosis not present

## 2021-02-11 DIAGNOSIS — I629 Nontraumatic intracranial hemorrhage, unspecified: Secondary | ICD-10-CM

## 2021-02-11 DIAGNOSIS — I82409 Acute embolism and thrombosis of unspecified deep veins of unspecified lower extremity: Secondary | ICD-10-CM | POA: Diagnosis present

## 2021-02-11 DIAGNOSIS — R262 Difficulty in walking, not elsewhere classified: Secondary | ICD-10-CM | POA: Diagnosis not present

## 2021-02-11 DIAGNOSIS — M79675 Pain in left toe(s): Secondary | ICD-10-CM | POA: Diagnosis present

## 2021-02-11 DIAGNOSIS — Z91018 Allergy to other foods: Secondary | ICD-10-CM

## 2021-02-11 DIAGNOSIS — Z888 Allergy status to other drugs, medicaments and biological substances status: Secondary | ICD-10-CM

## 2021-02-11 DIAGNOSIS — K219 Gastro-esophageal reflux disease without esophagitis: Secondary | ICD-10-CM | POA: Diagnosis present

## 2021-02-11 DIAGNOSIS — F17200 Nicotine dependence, unspecified, uncomplicated: Secondary | ICD-10-CM | POA: Diagnosis not present

## 2021-02-11 DIAGNOSIS — E860 Dehydration: Secondary | ICD-10-CM | POA: Diagnosis present

## 2021-02-11 DIAGNOSIS — R32 Unspecified urinary incontinence: Secondary | ICD-10-CM | POA: Diagnosis present

## 2021-02-11 DIAGNOSIS — M79672 Pain in left foot: Secondary | ICD-10-CM | POA: Diagnosis not present

## 2021-02-11 DIAGNOSIS — Z79899 Other long term (current) drug therapy: Secondary | ICD-10-CM

## 2021-02-11 DIAGNOSIS — A419 Sepsis, unspecified organism: Secondary | ICD-10-CM | POA: Diagnosis present

## 2021-02-11 DIAGNOSIS — Z8679 Personal history of other diseases of the circulatory system: Secondary | ICD-10-CM

## 2021-02-11 DIAGNOSIS — Z8673 Personal history of transient ischemic attack (TIA), and cerebral infarction without residual deficits: Secondary | ICD-10-CM | POA: Diagnosis present

## 2021-02-11 LAB — URINALYSIS, ROUTINE W REFLEX MICROSCOPIC
Bilirubin Urine: NEGATIVE
Glucose, UA: NEGATIVE mg/dL
Hgb urine dipstick: NEGATIVE
Ketones, ur: 20 mg/dL — AB
Leukocytes,Ua: NEGATIVE
Nitrite: NEGATIVE
Protein, ur: 30 mg/dL — AB
Specific Gravity, Urine: 1.024 (ref 1.005–1.030)
pH: 5 (ref 5.0–8.0)

## 2021-02-11 LAB — CBC WITH DIFFERENTIAL/PLATELET
Abs Immature Granulocytes: 0.02 10*3/uL (ref 0.00–0.07)
Basophils Absolute: 0 10*3/uL (ref 0.0–0.1)
Basophils Relative: 0 %
Eosinophils Absolute: 0.1 10*3/uL (ref 0.0–0.5)
Eosinophils Relative: 1 %
HCT: 41.3 % (ref 36.0–46.0)
Hemoglobin: 13.5 g/dL (ref 12.0–15.0)
Immature Granulocytes: 0 %
Lymphocytes Relative: 23 %
Lymphs Abs: 1 10*3/uL (ref 0.7–4.0)
MCH: 33.6 pg (ref 26.0–34.0)
MCHC: 32.7 g/dL (ref 30.0–36.0)
MCV: 102.7 fL — ABNORMAL HIGH (ref 80.0–100.0)
Monocytes Absolute: 0.3 10*3/uL (ref 0.1–1.0)
Monocytes Relative: 7 %
Neutro Abs: 3.2 10*3/uL (ref 1.7–7.7)
Neutrophils Relative %: 69 %
Platelets: 157 10*3/uL (ref 150–400)
RBC: 4.02 MIL/uL (ref 3.87–5.11)
RDW: 12.9 % (ref 11.5–15.5)
WBC: 4.6 10*3/uL (ref 4.0–10.5)
nRBC: 0 % (ref 0.0–0.2)

## 2021-02-11 LAB — COMPREHENSIVE METABOLIC PANEL
ALT: 22 U/L (ref 0–44)
AST: 47 U/L — ABNORMAL HIGH (ref 15–41)
Albumin: 3.9 g/dL (ref 3.5–5.0)
Alkaline Phosphatase: 54 U/L (ref 38–126)
Anion gap: 12 (ref 5–15)
BUN: 9 mg/dL (ref 8–23)
CO2: 23 mmol/L (ref 22–32)
Calcium: 9.2 mg/dL (ref 8.9–10.3)
Chloride: 106 mmol/L (ref 98–111)
Creatinine, Ser: 0.78 mg/dL (ref 0.44–1.00)
GFR, Estimated: >60 mL/min (ref >60)
Glucose, Bld: 106 mg/dL — ABNORMAL HIGH (ref 70–99)
Potassium: 3.1 mmol/L — ABNORMAL LOW (ref 3.5–5.1)
Sodium: 141 mmol/L (ref 135–145)
Total Bilirubin: 0.8 mg/dL (ref 0.3–1.2)
Total Protein: 6.5 g/dL (ref 6.5–8.1)

## 2021-02-11 MED ORDER — POTASSIUM CHLORIDE CRYS ER 20 MEQ PO TBCR: 40.0000 meq | EXTENDED_RELEASE_TABLET | Freq: Once | ORAL | Status: DC

## 2021-02-11 MED ORDER — PHENYTOIN SODIUM EXTENDED 30 MG PO CAPS
30.0000 mg | ORAL_CAPSULE | Freq: Every day | ORAL | Status: DC
  Administered 2021-02-11 – 2021-02-13 (×3): 30 mg via ORAL
  Filled 2021-02-11 (×4): qty 1

## 2021-02-11 MED ORDER — GABAPENTIN 300 MG PO CAPS
300.0000 mg | ORAL_CAPSULE | Freq: Every day | ORAL | Status: DC
  Administered 2021-02-11 – 2021-02-13 (×3): 300 mg via ORAL
  Filled 2021-02-11 (×3): qty 1

## 2021-02-11 MED ORDER — SODIUM CHLORIDE 0.9 % IV SOLN: 250.0000 mL | INTRAVENOUS | Status: DC | PRN

## 2021-02-11 MED ORDER — ACETAMINOPHEN 650 MG RE SUPP: 650.0000 mg | Freq: Four times a day (QID) | RECTAL | Status: DC | PRN

## 2021-02-11 MED ORDER — LACOSAMIDE 200 MG PO TABS
200.0000 mg | ORAL_TABLET | Freq: Two times a day (BID) | ORAL | Status: DC
  Administered 2021-02-11 – 2021-02-14 (×6): 200 mg via ORAL
  Filled 2021-02-11 (×4): qty 1
  Filled 2021-02-11 (×2): qty 4

## 2021-02-11 MED ORDER — PHENYTOIN SODIUM EXTENDED 100 MG PO CAPS
100.0000 mg | ORAL_CAPSULE | Freq: Two times a day (BID) | ORAL | Status: DC
  Administered 2021-02-11 – 2021-02-14 (×6): 100 mg via ORAL
  Filled 2021-02-11 (×6): qty 1

## 2021-02-11 MED ORDER — POTASSIUM CHLORIDE CRYS ER 20 MEQ PO TBCR
20.0000 meq | EXTENDED_RELEASE_TABLET | Freq: Once | ORAL | Status: AC
  Administered 2021-02-12: 20 meq via ORAL
  Filled 2021-02-11: qty 1

## 2021-02-11 MED ORDER — CEPHALEXIN 500 MG PO CAPS
500.0000 mg | ORAL_CAPSULE | Freq: Two times a day (BID) | ORAL | Status: DC
  Administered 2021-02-11 – 2021-02-14 (×6): 500 mg via ORAL
  Filled 2021-02-11 (×3): qty 1
  Filled 2021-02-11: qty 2
  Filled 2021-02-11: qty 1
  Filled 2021-02-11: qty 2

## 2021-02-11 MED ORDER — ACETAMINOPHEN 325 MG PO TABS
650.0000 mg | ORAL_TABLET | Freq: Four times a day (QID) | ORAL | Status: DC | PRN
  Filled 2021-02-11: qty 2

## 2021-02-11 MED ORDER — SODIUM CHLORIDE 0.9% FLUSH
3.0000 mL | Freq: Two times a day (BID) | INTRAVENOUS | Status: DC
  Administered 2021-02-11: 3 mL via INTRAVENOUS

## 2021-02-11 MED ORDER — DIPHENHYDRAMINE HCL 25 MG PO CAPS: 25.0000 mg | ORAL_CAPSULE | Freq: Four times a day (QID) | ORAL | Status: DC | PRN

## 2021-02-11 MED ORDER — SODIUM CHLORIDE 0.9% FLUSH: 3.0000 mL | INTRAVENOUS | Status: DC | PRN

## 2021-02-11 MED ORDER — DICLOFENAC SODIUM 1 % EX GEL
2.0000 g | Freq: Three times a day (TID) | CUTANEOUS | Status: DC | PRN
  Administered 2021-02-12: 2 g via TOPICAL
  Filled 2021-02-11: qty 100

## 2021-02-11 NOTE — ED Triage Notes (Signed)
Pt reports being seen at [facility] Saturday for same. Having tremors to entire body that are getting worse, causing patient to have difficulty ambulating. Had fall this am, now has redness to left foot. No acute distress is noted at this time.

## 2021-02-11 NOTE — ED Provider Notes (Signed)
Emergency Medicine Provider Triage Evaluation Note  [name] , a 65 y.o. female  was evaluated in triage.  Pt complains of multiple seizures.  Patient is brought in by her daughter who is at triage bedside, reports patient typically has 1 seizure per month, has had 10-12 seizures in the past 3 days.  Patient was seen in the ED 2 days ago for same, and has been using Ativan without improvement in seizure frequency.  Seizures described as whole body/grand mal followed by postictal state.  Patient's daughter states that she has been more confused for the past few days, not her typical postictal state, reports this is a persistent confusion.  Patient reportedly threw away her cell phone because it is "hurting her whole body."  Also reported having a scan today while in her bedroom at home which did not happen.  Denies biting tongue or oral injuries, does report bladder control loss.  Reports pain in her left foot and states that her left leg is swollen similar to when she had a DVT in her left leg in the past year.  Patient is unable to take blood thinners due to prior brain hemorrhage, does have an IVC filter.  The ER visit 2 days ago, was found to have a UTI, given IV and oral antibiotics which she has bee.  Review of Systems  Positive: Seizures, change in mental status Negative: Fever, abdominal pain, urinary symptoms  Physical Exam  BP (!) 127/51 (BP Location: Left Arm)   Pulse (!) 107   Temp 98.9 F (37.2 C) (Oral)   Resp 16   SpO2 96%  Gen:   Awake, no distress   Resp:  Normal effort  MSK:   Moves extremities without difficulty  Other:  Mild redness to left first MTP with tenderness.  Prominent veins in left leg.  Medical Decision Making  Medically screening exam initiated at 9:22 AM.  Appropriate orders placed.  LUCELLA POMMIER was informed that the remainder of the evaluation will be completed by another provider, this initial triage assessment does not replace that evaluation, and the  importance of remaining in the ED until their evaluation is complete.     Tacy Learn, PA-C 02/11/21 7948    Carmin Muskrat, MD 02/12/21 1655

## 2021-02-11 NOTE — H&P (Addendum)
History and Physical    [name] BZJ:[id number] DOB: [birth year] DOA: 02/11/2021  PCP: [name], MD Consultants:  cardiology: Dr. Debara Pickett. Neurology: [name] Patient coming from:  Home - lives with daughter, son in law, 2 grandkids and her ex mother in law.   Chief Complaint: increased seizures   HPI: [name] is a 65 y.o. female with medical history significant of seizures, stroke due to [place], PAF, hx of DVT with IVC, COPD, tremor, tobacco abuse, thrombocytopenia who presents to ER with worsening frequency of seizures. Friday evening is when seizures started and they think she had 3 seizures. They were unwitnessed, so unsure how long they lasted. On Saturday morning she had a seizure that lasted 30-45 seconds and then had another one. They called EMS and she was sent to [place].  She was told she had a UTI and sent home on medication and was told her dilantin level was low. Given loaded dose.  Medication was not adjusted because she had missed her dilantin Friday  night and had a UTI. Sunday she continued to have seizures throughout the day witnessed by her daughter, she states at least 4. She gave her some ativan, but continued to have seizures. She had another seizure this AM and so they came to the hospital. The daughter states that her mental state has been "off" as well. She means possibly more confused.  Currently on Xcopri 100mg  daily, dilantin 130mg  BID and vimpat 200mg  BID.  She also complains of burning in her legs that started today. She also hit her left foot and hit her first big toe and this is really hurting her. She denies any headaches, vision changes, chest pain, palpitations, shortness of breath, abdominal pain, N/V/D or other illness.   ED Course: vitals: bp: 127/51, hr: 107, afebrile, RR: 16 and oxygen: 96% on RA. Phenytoin level pending. CTH no acute findings. Neurology called in ER. We were asked to admit.    Review of Systems: As per HPI; otherwise review of  systems reviewed and negative.   Ambulatory Status:  Ambulates without assistance, but will sometimes use a walker if she is feeling off balance.     Past Medical History:  Diagnosis Date   Asthma    Cerebral hemorrhage ([place]) 1989   Chronic back pain    COPD (chronic obstructive pulmonary disease) (HCC)    DDD (degenerative disc disease) 06/12/2013   DVT (deep venous thrombosis) ([place])    "she's had several since 1990"   GERD (gastroesophageal reflux disease)    Headache    "usually around time when she's had a seizure"   History of blood transfusion    "when she was a baby"   History of stomach ulcers    Kidney stones    Neuropathy    Seizures ([place])    "because of her brain surgery"   Stroke [facility]) 1990's   family denies residual on 11/09/2014   Tunnel vision    "since brain OR"    Past Surgical History:  Procedure Laterality Date   [place]   craniotomy with hematoma evacuation   [place]; 1987; 1989   HEMORRHOID SURGERY     IVC FILTER INSERTION N/A 10/12/2020   Procedure: IVC FILTER INSERTION;  Surgeon: Angelia Mould, MD;  Location: [place] [facility];  Service: Cardiovascular;  Laterality: N/A;   TUBAL LIGATION  1990's    Social History   Socioeconomic History   Marital  status: Divorced    Spouse name: Not on file   Number of children: 3   Years of education: Not on file   Highest education level: Not on file  Occupational History   Not on file  Tobacco Use   Smoking status: Former    Packs/day: 0.50    Years: 20.00    Pack years: 10.00    Types: Cigarettes    Quit date: 10/26/2017    Years since quitting: 3.2   Smokeless tobacco: Never   Tobacco comments:    Previously smoked 2 ppd, down to 4 cigs/day  Vaping Use   Vaping Use: Never used  Substance and Sexual Activity   Alcohol use: No   Drug use: No   Sexual activity: Never    Birth control/protection: Post-menopausal  Other Topics Concern   Not on file  Social  History Narrative   Pt lives in 2 story home with her daughter, daughter's family and pt's mother-in-law   Has 2 living children / 1 deceased   October 16, 2022 grade education   Worked for [employer] until 1998 when she became disabled.    Right handed   Social Determinants of Health   Financial Resource Strain: Not on file  Food Insecurity: Not on file  Transportation Needs: Not on file  Physical Activity: Not on file  Stress: Not on file  Social Connections: Not on file  Intimate Partner Violence: Not on file    Allergies  Allergen Reactions   Zonisamide Other (See Comments)    Numbness and tingling over whole body   Chocolate Flavor Other (See Comments)    Trigger for seizures   Codeine Nausea And Vomiting   Keppra [Levetiracetam] Other (See Comments)    Causes seizures   Pregabalin Nausea And Vomiting   Olive Oil Rash    Family History  Problem Relation Age of Onset   Heart attack Mother        4 MIs, started in her 57s, also vaginal cancer and colon cancer   Cancer Mother    Heart attack Father        Died suddenly age 38 - autopsy revealed heart attack her patient   Cancer Brother        Sinus cancer    Prior to Admission medications   Medication Sig Start Date End Date Taking? Authorizing Provider  acetaminophen (TYLENOL) 500 MG tablet Take 1,000 mg by mouth every 6 (six) hours as needed for headache.    [provider]  Cenobamate (XCOPRI) 100 MG TABS Take 1 tablet by mouth daily. 01/28/21   [name], MD  cephALEXin (KEFLEX) 500 MG capsule Take 1 capsule (500 mg total) by mouth 2 (two) times daily for 5 days. 02/09/21 02/14/21  Carmin Muskrat, MD  cholecalciferol (VITAMIN D3) 25 MCG (1000 UNIT) tablet Take 1 tablet (1,000 Units total) by mouth daily. Patient not taking: No sig reported 01/24/20   [name], MD  [employer] B-12 500 MCG tablet TAKE 2 TABLETS BY MOUTH EVERY DAY Patient not taking: No sig reported 01/24/20   [name], MD  DILANTIN 100 MG ER  capsule Take 1 capsule twice a day Patient taking differently: Take 100 mg by mouth 2 (two) times daily. Also take 30 mg capsule with bedtime dose for a total nightly dose of 130 mg 01/28/21   [name], MD  DILANTIN 30 MG ER capsule Take 1 capsule every night (take with 100mg  capsule for total of 130mg   every night) Patient taking differently: Take 30 mg by mouth at bedtime. Take with a 100 mg capsule for a total nightly dose of 130 mg 01/28/21   [name], MD  escitalopram (LEXAPRO) 10 MG tablet Take 1 tablet (10 mg total) by mouth at bedtime. 01/28/21   [name], MD  gabapentin (NEURONTIN) 300 MG capsule Take 1 capsule every night Patient taking differently: Take 300 mg by mouth at bedtime. 01/28/21   [name], MD  lacosamide (VIMPAT) 200 MG TABS tablet Take 1 tablet (200 mg total) by mouth 2 (two) times daily. 01/28/21   [name], MD  LORazepam (ATIVAN) 1 MG tablet Take 1 tablet (1 mg total) by mouth See admin instructions. Take 1 mg by mouth as directed/as needed for 3 or more seizures/24 hours-  Do not take more than 2 mg in 24 hours 01/28/21   [name], MD    Physical Exam: Vitals:   02/11/21 1852 02/11/21 1900 02/11/21 2000 02/11/21 2100  BP: 118/60 (!) 123/59 114/65 128/64  Pulse: 65 96 92 97  Resp: 12 18 14  (!) 23  Temp:      TempSrc:      SpO2: 99% 100% 100% 97%     General:  Appears calm and comfortable and is in NAD Eyes:  PERRL, EOMI, normal lids, iris ENT:  grossly normal hearing, lips & tongue, mmm; appropriate dentition Neck:  no LAD, masses or thyromegaly; no carotid bruits Cardiovascular:  RRR, no m/r/g. No LE edema.  Respiratory:   CTA bilaterally with no wheezes/rales/rhonchi.  Normal respiratory effort. Abdomen:  soft, NT, ND, NABS Back:   normal alignment, no CVAT Skin:  no rash or induration seen on limited exam Musculoskeletal:  grossly normal tone BUE/BLE, good ROM, no bony abnormality Lower extremity:  No LE edema.  Limited  foot exam with no ulcerations.  2+ distal pulses. TTP over her left MTP of her first toe.  Psychiatric:  grossly normal mood and affect, speech fluent and appropriate, AOx3 Neurologic:  CN 2-12 grossly intact, moves all extremities in coordinated fashion, sensation intact. Tremor.     Radiological Exams on Admission: Independently reviewed - see discussion in A/P where applicable  CT Head Wo Contrast  Result Date: 02/11/2021 CLINICAL DATA:  Tremors to entire body and difficulty ambulating. Status post fall this morning. EXAM: CT HEAD WITHOUT CONTRAST TECHNIQUE: Contiguous axial images were obtained from the base of the skull through the vertex without intravenous contrast. COMPARISON:  08/10/2020 FINDINGS: Brain: Diminished exam detail due to motion artifact. Images through the convexity of the cerebral hemispheres, inferior temporal lobes, inferior frontal lobe and inferior cerebellum are essentially nondiagnostic. There is a large area of encephalomalacia involving the right parieto-occipital lobe. Unchanged from previous exam. Prominence of the sulci and ventricles compatible with brain atrophy. Vascular: No hyperdense vessel or unexpected calcification. Skull: Status post right parietal craniotomy.  No acute abnormality. Sinuses/Orbits: No acute finding. Other: None IMPRESSION: 1. Diminished exam detail due to motion artifact. Images through the convexity of the cerebral hemispheres, inferior temporal lobes, inferior frontal lobe and inferior cerebellum are essentially nondiagnostic. Within this limitation there are no signs to suggest acute infarct, hemorrhage or mass. 2. Large area of encephalomalacia involving the right parieto-occipital lobe with overlying craniotomy defect. 3. Small vessel ischemic disease and brain atrophy. Electronically Signed   By: Kerby Moors M.D.   On: 02/11/2021 10:14   DG Foot Complete Left  Result Date: 02/11/2021 CLINICAL  DATA:  66 year old female with left foot  pain. EXAM: LEFT FOOT - COMPLETE 3+ VIEW COMPARISON:  None. FINDINGS: There is no acute fracture or dislocation. The bones are osteopenic. Old healed fracture deformity of the fifth metatarsal. The soft tissues are unremarkable. IMPRESSION: No acute fracture or dislocation. Electronically Signed   By: Anner Crete M.D.   On: 02/11/2021 19:25    EKG: Independently reviewed.  Sinus tachy with rate 104; nonspecific ST changes with no evidence of acute ischemia. Faster than previous tracings.    Labs on Admission: I have personally reviewed the available labs and imaging studies at the time of the admission.  Pertinent labs:  MCV: 102.7 Potassium: 3.1 AST: 47 Phenytoin level pending   Assessment/Plan Principal Problem:   Seizure disorder [facility]) -neurology recommended admitting and getting phenytoin level -adjust meds once back.  -per family her neurologist was trying to wean her off of this completely.  -continued all of her home medication: vimpat, dilantin, xcopri and dilantin.  -? If breakthrough seizure from UTI vs. Missing dose of medication -no witnessed seizure since she has been in ER. -seizure precautions/tele   Active Problems:   UTI (urinary tract infection) -received one dose of rocephin in ER -culture pending -started on keflex in ER BID -she complains of feeling like her legs are burning that started today. Have not heard of this reaction with keflex, but asked her to see if worse after she took abx, may need try alternative therapy.   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     There is no height or weight on file to calculate BMI.   Level of care: Telemetry Medical DVT prophylaxis: known DVT in 10/2020. No SCDs and can not anticoagulate. Has IVC filter.  Code Status:  Full - confirmed with patient/family Family Communication: daughter at bedside: [name] Crill Disposition Plan:  The patient is from: home  Anticipated d/c is to: home   Anticipated d/c date will depend on clinical response to treatment, but possibly as early as tomorrow if she has excellent response to treatment   Consults called: neurology in EDP who recommended we watch overnight and get dilantin level and adjust as needed.  Admission status:  inpatient.    Orma Flaming MD [facility]   How to contact the [facility] Attending or Consulting provider [place] or covering provider during after hours [place], for this patient?  Check the care team in [facility] and look for a) attending/consulting [facility] provider listed and b) the [facility] team listed Log into [email or web address] and use [place]'s universal password to access. If you do not have the password, please contact the hospital operator. Locate the [facility] provider you are looking for under [facility] and page to a number that you can be directly reached. If you still have difficulty reaching the provider, please page the [facility] (Director on Call)  for the Hospitalists listed on amion for assistance.   02/11/2021, 10:58 PM

## 2021-02-11 NOTE — ED Notes (Signed)
Pt given a sandwich bag and a drink. Call light in reach.

## 2021-02-11 NOTE — ED Provider Notes (Signed)
[facility] EMERGENCY DEPARTMENT Provider Note   CSN: [id number] Arrival date & time: 02/11/21  0855     History Chief Complaint  Patient presents with   Tremors   Fall    [name] is a 65 y.o. female.  Presents ER with concern for seizures, altered mental status.  History obtained from patient and daughter at bedside.  Also obtained through chart review.  Over the past 3 days daughter estimates patient has had total of 10-12 seizure episodes.  Generally lasting for a minute or so, generally has bladder incontinence.  Seems to be slightly confused today which prompted the visit.  Patient denies any acute complaints at present.  No seizures since being in the waiting room.  Has been taking medication as prescribed.  Was seen in ER at [facility] over the weekend.  Diagnosed with UTI, given Keflex Rx, Dilantin load and discharged.  HPI     Past Medical History:  Diagnosis Date   Asthma    Cerebral hemorrhage ([place]) 1989   Chronic back pain    COPD (chronic obstructive pulmonary disease) (HCC)    DDD (degenerative disc disease) 06/12/2013   DVT (deep venous thrombosis) ([place])    "she's had several since 1990"   GERD (gastroesophageal reflux disease)    Headache    "usually around time when she's had a seizure"   History of blood transfusion    "when she was a baby"   History of stomach ulcers    Kidney stones    Neuropathy    Seizures ([place])    "because of her brain surgery"   Stroke [facility]) 1990's   family denies residual on 11/09/2014   Tunnel vision    "since brain OR"    Patient Active Problem List   Diagnosis Date Noted   DVT (deep venous thrombosis) ([place]) 10/10/2020   Frequent falls 10/10/2020   Dysuria 10/10/2020   PAF (paroxysmal atrial fibrillation) ([place]) 10/10/2020   Fall 08/02/2020   Atrial flutter ([place])    COVID-19 virus infection 04/02/2020   Lactic acidosis 04/02/2020   Ataxia 08/26/2019   Elevated Dilantin level 08/26/2019   Closed  fracture of proximal phalanx of great toe 01/04/2019   Closed fracture of fifth metatarsal bone 01/04/2019   Pain in left foot 01/04/2019   Altered mental status    Weakness of both lower extremities    Generalized weakness 05/25/2018   Pain    Chronic back pain    Neuropathy    Orthostasis    Tobacco abuse    Left-sided weakness 03/19/2018   Atypical chest pain 02/17/2017   Localized swelling of lower extremity 02/17/2017   GERD (gastroesophageal reflux disease) 02/17/2017   Costochondritis 02/17/2017   Seizures ([place]) 11/02/2015   Gait disturbance 11/02/2015   Right clavicle fracture 11/02/2015   Urinary tract infection associated with catheterization of urinary tract, initial encounter ([place]) 11/02/2015   Chest pain 03/31/2015   Acute respiratory failure with hypoxia ([place]) 03/31/2015   SOB (shortness of breath) 03/31/2015   Malnutrition of moderate degree ([place]) 03/31/2015   COPD with exacerbation ([place]) 03/30/2015   Hypoxia 01/18/2015   Chronic obstructive pulmonary disease with acute exacerbation ([place])    Fever 01/17/2015   Cough 01/17/2015   Sepsis secondary to UTI ([place]) 01/17/2015   COPD (chronic obstructive pulmonary disease) ([place])    Unintentional weight loss 01/11/2015   Anxiety 10/22/2014   Asthma 05/15/2014   Seizure ([place]) 05/15/2014   Protein calorie malnutrition (  [place]) 05/15/2014   Arteriovenous malformation of brain 11/17/2013   Partial epilepsy with impairment of consciousness, 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/17/2013   Stroke due to intracerebral hemorrhage ([place]) 11/17/2013   DVT of leg (deep venous thrombosis) ([place]) 06/19/2013   DDD (degenerative disc disease) 06/12/2013   Paresthesias 06/12/2013   Tremor 06/09/2013   Leukopenia 12/14/2012   Thrombocytopenia- chronic 06/29/2012   Noncompliance with medication treatment due to underuse of medication 06/29/2012   Stress-personal 06/29/2012   Recurrent seizures ([place]) 06/29/2012   Tobacco dependence 06/28/2012   Skull  fracture ([place]) 02/28/2012   Dilantin toxicity 02/28/2012   Seizure disorder ([place]) 02/28/2012   History of Intracranial bleed 02/28/2012    Past Surgical History:  Procedure Laterality Date   [place]   craniotomy with hematoma evacuation   [place]; 1987; [place]     IVC FILTER INSERTION N/A 10/12/2020   Procedure: IVC FILTER INSERTION;  Surgeon: Angelia Mould, MD;  Location: [facility];  Service: Cardiovascular;  Laterality: N/A;   TUBAL LIGATION  1990's     OB History   No obstetric history on file.     Family History  Problem Relation Age of Onset   Heart attack Mother        4 MIs, started in her 62s, also vaginal cancer and colon cancer   Cancer Mother    Heart attack Father        Died suddenly age 23 - autopsy revealed heart attack her patient   Cancer Brother        Sinus cancer    Social History   Tobacco Use   Smoking status: Former    Packs/day: 0.50    Years: 20.00    Pack years: 10.00    Types: Cigarettes    Quit date: 10/26/2017    Years since quitting: 3.2   Smokeless tobacco: Never   Tobacco comments:    Previously smoked 2 ppd, down to 4 cigs/day  Vaping Use   Vaping Use: Never used  Substance Use Topics   Alcohol use: No   Drug use: No    Home Medications Prior to Admission medications   Medication Sig Start Date End Date Taking? Authorizing Provider  acetaminophen (TYLENOL) 500 MG tablet Take 1,000 mg by mouth every 6 (six) hours as needed for headache.    [provider]  Cenobamate (XCOPRI) 100 MG TABS Take 1 tablet by mouth daily. 01/28/21   [name], MD  cephALEXin (KEFLEX) 500 MG capsule Take 1 capsule (500 mg total) by mouth 2 (two) times daily for 5 days. 02/09/21 02/14/21  Carmin Muskrat, MD  cholecalciferol (VITAMIN D3) 25 MCG (1000 UNIT) tablet Take 1 tablet (1,000 Units total) by mouth daily. Patient not taking: No sig reported 01/24/20   [name], MD   [employer] B-12 500 MCG tablet TAKE 2 TABLETS BY MOUTH EVERY DAY Patient not taking: No sig reported 01/24/20   [name], MD  DILANTIN 100 MG ER capsule Take 1 capsule twice a day Patient taking differently: Take 100 mg by mouth 2 (two) times daily. Also take 30 mg capsule with bedtime dose for a total nightly dose of 130 mg 01/28/21   [name], MD  DILANTIN 30 MG ER capsule Take 1 capsule every night (take with 100mg  capsule for total of 130mg  every night) Patient taking differently: Take 30 mg by mouth at bedtime. Take with a 100  mg capsule for a total nightly dose of 130 mg 01/28/21   [name], MD  escitalopram (LEXAPRO) 10 MG tablet Take 1 tablet (10 mg total) by mouth at bedtime. 01/28/21   [name], MD  gabapentin (NEURONTIN) 300 MG capsule Take 1 capsule every night Patient taking differently: Take 300 mg by mouth at bedtime. 01/28/21   [name], MD  lacosamide (VIMPAT) 200 MG TABS tablet Take 1 tablet (200 mg total) by mouth 2 (two) times daily. 01/28/21   [name], MD  LORazepam (ATIVAN) 1 MG tablet Take 1 tablet (1 mg total) by mouth See admin instructions. Take 1 mg by mouth as directed/as needed for 3 or more seizures/24 hours-  Do not take more than 2 mg in 24 hours 01/28/21   [name], MD    Allergies    Zonisamide, Chocolate flavor, Codeine, Keppra [levetiracetam], Pregabalin, and Olive oil  Review of Systems   Review of Systems  Constitutional:  Negative for chills and fever.  HENT:  Negative for ear pain and sore throat.   Eyes:  Negative for pain and visual disturbance.  Respiratory:  Negative for cough and shortness of breath.   Cardiovascular:  Negative for chest pain and palpitations.  Gastrointestinal:  Negative for abdominal pain and vomiting.  Genitourinary:  Negative for dysuria and hematuria.  Musculoskeletal:  Negative for arthralgias and back pain.  Skin:  Negative for color change and rash.  Neurological:  Positive for  seizures. Negative for syncope.  All other systems reviewed and are negative.  Physical Exam Updated Vital Signs BP (!) 123/48   Pulse 97   Temp 98 F (36.7 C)   Resp 12   SpO2 100%   Physical Exam Vitals and nursing note reviewed.  Constitutional:      General: She is not in acute distress.    Appearance: She is well-developed.  HENT:     Head: Normocephalic and atraumatic.  Eyes:     Conjunctiva/sclera: Conjunctivae normal.  Cardiovascular:     Rate and Rhythm: Normal rate and regular rhythm.     Heart sounds: No murmur heard. Pulmonary:     Effort: Pulmonary effort is normal. No respiratory distress.     Breath sounds: Normal breath sounds.  Abdominal:     Palpations: Abdomen is soft.     Tenderness: There is no abdominal tenderness.  Musculoskeletal:     Cervical back: Neck supple.  Skin:    General: Skin is warm and dry.  Neurological:     General: No focal deficit present.     Mental Status: She is alert and oriented to person, place, and time.     Sensory: No sensory deficit.     Motor: No weakness.    ED Results / Procedures / Treatments   Labs (all labs ordered are listed, but only abnormal results are displayed) Labs Reviewed  CBC WITH DIFFERENTIAL/PLATELET - Abnormal; Notable for the following components:      Result Value   MCV 102.7 (*)    All other components within normal limits  COMPREHENSIVE METABOLIC PANEL - Abnormal; Notable for the following components:   Potassium 3.1 (*)    Glucose, Bld 106 (*)    AST 47 (*)    All other components within normal limits  URINALYSIS, ROUTINE W REFLEX MICROSCOPIC - Abnormal; Notable for the following components:   Color, Urine AMBER (*)    APPearance HAZY (*)    Ketones, ur  20 (*)    Protein, ur 30 (*)    Bacteria, UA FEW (*)    All other components within normal limits  SARS CORONAVIRUS 2 (TAT 6-24 HRS)  PHENYTOIN LEVEL, FREE AND TOTAL  CBG MONITORING, ED    EKG None  Radiology CT Head Wo  Contrast  Result Date: 02/11/2021 CLINICAL DATA:  Tremors to entire body and difficulty ambulating. Status post fall this morning. EXAM: CT HEAD WITHOUT CONTRAST TECHNIQUE: Contiguous axial images were obtained from the base of the skull through the vertex without intravenous contrast. COMPARISON:  08/10/2020 FINDINGS: Brain: Diminished exam detail due to motion artifact. Images through the convexity of the cerebral hemispheres, inferior temporal lobes, inferior frontal lobe and inferior cerebellum are essentially nondiagnostic. There is a large area of encephalomalacia involving the right parieto-occipital lobe. Unchanged from previous exam. Prominence of the sulci and ventricles compatible with brain atrophy. Vascular: No hyperdense vessel or unexpected calcification. Skull: Status post right parietal craniotomy.  No acute abnormality. Sinuses/Orbits: No acute finding. Other: None IMPRESSION: 1. Diminished exam detail due to motion artifact. Images through the convexity of the cerebral hemispheres, inferior temporal lobes, inferior frontal lobe and inferior cerebellum are essentially nondiagnostic. Within this limitation there are no signs to suggest acute infarct, hemorrhage or mass. 2. Large area of encephalomalacia involving the right parieto-occipital lobe with overlying craniotomy defect. 3. Small vessel ischemic disease and brain atrophy. Electronically Signed   By: Kerby Moors M.D.   On: 02/11/2021 10:14   DG Knee Complete 4 Views Left  Result Date: 02/09/2021 CLINICAL DATA:  Recent fall following seizure activity with left knee pain, initial encounter EXAM: LEFT KNEE - COMPLETE 4+ VIEW COMPARISON:  None. FINDINGS: No evidence of fracture, dislocation, or joint effusion. No evidence of arthropathy or other focal bone abnormality. Soft tissues are unremarkable. IMPRESSION: No acute abnormality noted. Electronically Signed   By: Inez Catalina M.D.   On: 02/09/2021 20:06   DG Hip Unilat With Pelvis  2-3 Views Left  Result Date: 02/09/2021 CLINICAL DATA:  Recent seizure activity with fall and left hip pain, initial encounter EXAM: DG HIP (WITH OR WITHOUT PELVIS) 3V LEFT COMPARISON:  None. FINDINGS: Pelvic ring is intact. Minimal degenerative changes of the hip joints are noted bilaterally. No acute fracture or dislocation is noted. No soft tissue abnormality is seen. IMPRESSION: No acute abnormality noted. Electronically Signed   By: Inez Catalina M.D.   On: 02/09/2021 20:07    Procedures Procedures   Medications Ordered in ED Medications  lacosamide (VIMPAT) tablet 200 mg (has no administration in time range)  gabapentin (NEURONTIN) capsule 300 mg (has no administration in time range)  phenytoin (DILANTIN) ER capsule 100 mg (has no administration in time range)  phenytoin (DILANTIN) ER capsule 30 mg (has no administration in time range)  cephALEXin (KEFLEX) capsule 500 mg (has no administration in time range)    ED Course  I have reviewed the triage vital signs and the nursing notes.  Pertinent labs & imaging results that were available during my care of the patient were reviewed by me and considered in my medical decision making (see chart for details).    MDM Rules/Calculators/A&P                          65 year old lady with history of seizures presents to ER with concern for increase in seizure activity and altered mental status.  On exam patient is currently quite well-appearing  in no acute distress.  She was in the waiting room for almost 8 hours prior to getting to arrival.  While in the waiting room, did not have any further seizure activity.  Discussed the case with Dr. But got on for neurology.  She recommends admission to the medicine service for observation overnight.  Recommends sending Dilantin level, continuing home medications for now.  May need additional Dilantin pending levels.  Does not necessarily need EEG.  Discussed case with Dr. Verl Blalock with the hospitalist service  who will come admit patient.  Final Clinical Impression(s) / ED Diagnoses Final diagnoses:  Seizure [facility])    Rx / DC Orders ED Discharge Orders     None        Lucrezia Starch, MD 02/11/21 [place]

## 2021-02-12 DIAGNOSIS — G40909 Epilepsy, unspecified, not intractable, without status epilepticus: Secondary | ICD-10-CM | POA: Diagnosis not present

## 2021-02-12 DIAGNOSIS — J449 Chronic obstructive pulmonary disease, unspecified: Secondary | ICD-10-CM | POA: Diagnosis not present

## 2021-02-12 LAB — BASIC METABOLIC PANEL
Anion gap: 12 (ref 5–15)
BUN: 8 mg/dL (ref 8–23)
CO2: 22 mmol/L (ref 22–32)
Calcium: 8.4 mg/dL — ABNORMAL LOW (ref 8.9–10.3)
Chloride: 106 mmol/L (ref 98–111)
Creatinine, Ser: 0.74 mg/dL (ref 0.44–1.00)
GFR, Estimated: >60 mL/min (ref >60)
Glucose, Bld: 96 mg/dL (ref 70–99)
Potassium: 4.3 mmol/L (ref 3.5–5.1)
Sodium: 140 mmol/L (ref 135–145)

## 2021-02-12 LAB — FOLATE: Folate: 7.5 ng/mL (ref >5.9)

## 2021-02-12 LAB — SARS CORONAVIRUS 2 (TAT 6-24 HRS): SARS Coronavirus 2: NEGATIVE

## 2021-02-12 LAB — VITAMIN B12: Vitamin B-12: 208 pg/mL (ref 180–914)

## 2021-02-12 LAB — MAGNESIUM: Magnesium: 2.2 mg/dL (ref 1.7–2.4)

## 2021-02-12 LAB — TSH: TSH: 1.963 u[IU]/mL (ref 0.350–4.500)

## 2021-02-12 MED ORDER — METOPROLOL TARTRATE 25 MG PO TABS
25.0000 mg | ORAL_TABLET | Freq: Two times a day (BID) | ORAL | Status: DC
  Administered 2021-02-12 – 2021-02-14 (×4): 25 mg via ORAL
  Filled 2021-02-12 (×4): qty 1

## 2021-02-12 MED ORDER — CENOBAMATE 100 MG PO TABS
1.0000 | ORAL_TABLET | Freq: Every day | ORAL | Status: DC
  Administered 2021-02-13 – 2021-02-14 (×2): 1 via ORAL

## 2021-02-12 MED ORDER — LACOSAMIDE 50 MG PO TABS: 200.0000 mg | ORAL_TABLET | Freq: Two times a day (BID) | ORAL | Status: DC

## 2021-02-12 MED ORDER — PHENYTOIN SODIUM EXTENDED 30 MG PO CAPS: 30.0000 mg | ORAL_CAPSULE | Freq: Every day | ORAL | Status: DC

## 2021-02-12 MED ORDER — HEPARIN SODIUM (PORCINE) 5000 UNIT/ML IJ SOLN: 5000.0000 [IU] | Freq: Three times a day (TID) | INTRAMUSCULAR | Status: DC

## 2021-02-12 MED ORDER — ESCITALOPRAM OXALATE 10 MG PO TABS
10.0000 mg | ORAL_TABLET | Freq: Every day | ORAL | Status: DC
  Administered 2021-02-12 – 2021-02-13 (×2): 10 mg via ORAL
  Filled 2021-02-12 (×2): qty 1

## 2021-02-12 MED ORDER — PANTOPRAZOLE SODIUM 40 MG PO TBEC
40.0000 mg | DELAYED_RELEASE_TABLET | Freq: Every day | ORAL | Status: DC
  Administered 2021-02-12 – 2021-02-14 (×3): 40 mg via ORAL
  Filled 2021-02-12 (×3): qty 1

## 2021-02-12 MED ORDER — GABAPENTIN 300 MG PO CAPS: 300.0000 mg | ORAL_CAPSULE | Freq: Every day | ORAL | Status: DC

## 2021-02-12 MED ORDER — LACTATED RINGERS IV SOLN: INTRAVENOUS | Status: AC

## 2021-02-12 MED ORDER — VITAMIN D 25 MCG (1000 UNIT) PO TABS
1000.0000 [IU] | ORAL_TABLET | Freq: Every day | ORAL | Status: DC
  Administered 2021-02-12 – 2021-02-14 (×3): 1000 [IU] via ORAL
  Filled 2021-02-12 (×3): qty 1

## 2021-02-12 MED ORDER — PHENYTOIN SODIUM EXTENDED 100 MG PO CAPS: 100.0000 mg | ORAL_CAPSULE | Freq: Two times a day (BID) | ORAL | Status: DC

## 2021-02-12 MED ORDER — VITAMIN B-12 1000 MCG PO TABS
1000.0000 ug | ORAL_TABLET | Freq: Every day | ORAL | Status: DC
  Administered 2021-02-12 – 2021-02-14 (×3): 1000 ug via ORAL
  Filled 2021-02-12 (×3): qty 1

## 2021-02-12 NOTE — ED Notes (Signed)
Called lab and they said they will add on TSH

## 2021-02-12 NOTE — Consult Note (Addendum)
Neurology Consultation Reason for Consult: seizure Referring Physician: Dr. Lala Lund  CC: Breakthrough seizure  History is obtained from: Patient, chart review  HPI: [name] is a 65 y.o. 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   Patient states she has had 3 generalized tonic-clonic seizures with urinary incontinence since yesterday.  Thinks she forgot to take her antiseizure medications on Friday night but has been taking it over the weekend.  States at baseline she has about 1 seizure every month and sometimes go few months without any seizures.   Of note, patient primary epileptologist is [name].  I personally reviewed her most recent clinic note dated 01/28/2021 summarized as follows.  Patient's seizures were described as left arm jerking which are at times preceded by an aura of headache and burning sensation of the left arm.  She has also been noted to have clicking and locking movements of the pelvis with unresponsiveness.  She had an EMU admission in March 2014 with 11 seizures were captured from right posterior hemisphere/right inferior temporal region.  Interictal EEG also showed abundant right posterior temporal epileptiform discharges, frequent right anterior temporal epileptiform discharges as well as occasional independent left anterior temporal epileptiform discharges.  MRI brain in 2014 showed right paradoxical encephalomalacia, right hippocampus smaller than left.  PET scan in 2015 showed multiple areas of hypermetabolic them on the right mostly corresponding to her adjacent to the structural abnormality.  Ictal SPECT showed right hemisphere hypoperfusion.  SISCOM right frontal region significant on subtraction. MEG in 07/23/2013 showed spikes localizing in the right occipital,  parietal, mesial temporal regions with a week signal in left mesial parietal region.  Functional mapping for suggestive of left language lateralization.   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 Otherwise she had a normal birth and early development.  There is no history of febrile convulsions, CNS infections such as meningitis/encephalitis, or family history of seizures.  Current AEDs: Vimpat 200 mg twice daily, Xcopri 100 mg daily, Dilantin 100 mg every morning, 130 mg nightly and gabapentin 300 mg nightly  Prior AEDs: Phenobarbital (ineffective, mood issues), Depakote (multiple trials, ineffective), Keppra (ineffective), Zonisamide (side effects on brief trial of higher dose than prescribed - 500mg  instead of 200mg ), Onfi (mouth sores), Tegretol (ineffective), Lamictal (nausea, dizziness, malaise), Felbatol (nausea, dizziness, malaise), Topamax (ineffective), Tigabine (tried per notes, details unclear), Neurontin (ineffective, nausea), Lyrica (leg pain reported on 25mg  daily, tingling), Briviact (drowsiness), Fycompa   MRI brain without contrast 05/2018 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   EEG done 06/2018 showed right posterior temporal slowing, occasional epileptiform discharges over the right posterior temporal region, breach artifact in this region.   ROS: All other systems reviewed and negative except as noted in the HPI.    Past Medical History:  Diagnosis Date   Asthma    Cerebral hemorrhage ([place]) 1989   Chronic back pain    COPD (chronic obstructive pulmonary disease) (HCC)    DDD (degenerative disc disease) 06/12/2013   DVT (deep venous thrombosis) ([place])    "she's had several since 1990"   GERD (gastroesophageal reflux disease)    Headache    "usually around time when she's had a seizure"   History of blood transfusion     "when she was a  baby"   History of stomach ulcers    Kidney stones    Neuropathy    Seizures ([place])    "because of her brain surgery"   Stroke [facility]) 1990's   family denies residual on 11/09/2014   Tunnel vision    "since brain OR"    Family History  Problem Relation Age of Onset   Heart attack Mother        4 MIs, started in her 70s, also vaginal cancer and colon cancer   Cancer Mother    Heart attack Father        Died suddenly age 80 - autopsy revealed heart attack her patient   Cancer Brother        Sinus cancer    Social History:  reports that she quit smoking about 3 years ago. Her smoking use included cigarettes. She has a 10.00 pack-year smoking history. She has never used smokeless tobacco. She reports that she does not drink alcohol and does not use drugs.  Exam: Current vital signs: BP 108/62   Pulse 86   Temp 98 F (36.7 C)   Resp 16   SpO2 94%  Vital signs in last 24 hours: Temp:  [98 F (36.7 C)-98.4 F (36.9 C)] 98 F (36.7 C) (07/11 1706) Pulse Rate:  [65-197] 86 (07/12 0926) Resp:  [7-24] 16 (07/12 0815) BP: (103-137)/(46-110) 108/62 (07/12 0926) SpO2:  [90 %-100 %] 94 % (07/12 0815)   Physical Exam  Constitutional: Appears well-developed and well-nourished.  Psych: Affect appropriate to situation Eyes: No scleral injection HENT: No OP obstrucion Head: Normocephalic.  Cardiovascular: Normal rate and regular rhythm.  Respiratory: Effort normal, non-labored breathing GI: Soft.  No distension. There is no tenderness.  Skin: Warm Neuro: AOx3, cranial nerves 2-12 grossly intact, 5/5 in all extremities  I have reviewed labs in epic and the results pertinent to this consultation are: CBC:  Recent Labs  Lab 02/09/21 1519 02/11/21 0945  WBC 6.5 4.6  NEUTROABS 5.1 3.2  HGB 12.9 13.5  HCT 39.7 41.3  MCV 103.7* 102.7*  PLT 149* 177    Basic Metabolic Panel:  Lab Results  Component Value Date   NA 140 02/12/2021   K 4.3 02/12/2021   CO2 22  02/12/2021   GLUCOSE 96 02/12/2021   BUN 8 02/12/2021   CREATININE 0.74 02/12/2021   CALCIUM 8.4 (L) 02/12/2021   GFRNONAA >60 02/12/2021   GFRAA >60 04/24/2020   Lipid Panel:  Lab Results  Component Value Date   LDLCALC 112 (H) 10/10/2020   HgbA1c:  Lab Results  Component Value Date   HGBA1C 5.0 08/02/2020   Urine Drug Screen:     Component Value Date/Time   LABOPIA NONE DETECTED 08/26/2019 0510   COCAINSCRNUR NONE DETECTED 08/26/2019 0510   LABBENZ NONE DETECTED 08/26/2019 0510   AMPHETMU NONE DETECTED 08/26/2019 0510   THCU NONE DETECTED 08/26/2019 0510   LABBARB NONE DETECTED 08/26/2019 0510    Alcohol Level     Component Value Date/Time   ETH <10 02/09/2021 1519     I have reviewed the images obtained:  CT head without contrast 02/11/2021:  1. Diminished exam detail due to motion artifact. Images through the convexity of the cerebral hemispheres, inferior temporal lobes,inferior frontal lobe and inferior cerebellum are essentially nondiagnostic. Within this limitation there are no signs to suggest acute infarct, hemorrhage or mass. 2. Large area of encephalomalacia involving the right parieto-occipital lobe with overlying craniotomy defect. 3. Small vessel ischemic disease  and brain atrophy.    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   Medical intractable epilepsy with breakthrough seizures UTI -Breakthrough seizure most likely due to UTI  Recommendations: -Even though patient's phenytoin level was subtherapeutic, on review of Dr. Amparo Bristol note patient has side effects with higher doses of Dilantin.  Therefore recommend continuing Dilantin at home dose 100 mg nightly and 130 mg nightly -Continue Vimpat 200 mg twice daily -Continue Xcopri 100 mg daily (not  administered this morning as not available in formulary).  Discussed with patient, she states her daughter can bring it from home -Seizures have been in the setting of UTI, therefore will hold off on adjusting AEDs -However, if patient has any further seizures, can increase Xcopri to 150 mg daily.  If daughter has not had a chance to bring Xcopri, can administer an additional dose of gabapentin 300 mg -If patient remains seizure-free, can likely be discharged tomorrow from neurology standpoint -Patient expressed significant frustration over her seizures.  On review of note, patient has had presurgical evaluation in the past but is not interested in pursuing any intracranial surgery.  I did discuss vagal nerve stimulator with patient and she was interested in it.  I will discuss this with patient's primary epileptologist [name]. If [name] agrees, we can place a referral to neurosurgery. -Continue seizure precautions -As needed IV Ativan 2 mg for clinical seizure-like activity -Follow-up with [name] scheduled on 05/31/2021  I have spent a total of  35 minutes with the patient reviewing hospital notes,  test results, labs and examining the patient as well as establishing an assessment and plan that was discussed personally with the patient.  > 50% of time was spent in direct patient care.    Zeb Comfort Epile[facility] neurohospitalist

## 2021-02-12 NOTE — Progress Notes (Addendum)
PROGRESS NOTE                                                                                                                                                                                                             Patient Demographics:    [name], is a 65 y.o. female, DOB - 20-Mar-1956, FOY:[id number]  Outpatient Primary MD for the patient is [name], MD    LOS - 1  Admit date - 02/11/2021    Chief Complaint  Patient presents with   Tremors   Fall       Brief Narrative (HPI from H&P)  [name]  is a 65 y.o. female, with history of cerebral hemorrhage, stroke in the past with chronic mild left-sided weakness, COPD, smoking quit smoking few years ago, history of DVT in the past with recent IVC filter placement, history of seizures.  She has been brought to the hospital due to recurrent seizures for the last 3 to 4 days noted by her daughter, had detailed discussion with patient's daughter and according to her patient has had between 10-12 seizure-like episodes in the last 3 days, apparently last Friday she missed 1 dose of Dilantin, initial breakthrough seizure started on Saturday when she was brought to [facility] where she was stabilized diagnosed with UTI given loading dose of Dilantin and then discharged home.  However they continue to have seizures possibly 10-12 episodes over the course of Sunday and Monday and subsequently brought to the hospital.  In the ER neurology was consulted and hospitalist team was requested to admit the patient.   Subjective:    [name] today has, No headache, No chest pain, No abdominal pain - No Nausea, No new weakness tingling or numbness, no SOB.   Assessment  & Plan :     Breakthrough seizures apparently 10-12 episodes in the last 2 days per daughter - she Dilantin and on Friday she missed a dose, she was brought to [facility], ER where she received a loading  IV dose of Dilantin on Saturday, present Dilantin levels are pending, she has been admitted with seizure precautions, head CT is nonacute, exam is benign except for mild chronic left-sided weakness due to previous intracerebral hemorrhage for now continue Dilantin, Lacosamide, Neurontin & Cenobamate, have requested neurology to evaluate the patient as well as  she seems to be having unusually high seizure activity.  2.  UTI for now placed on Keflex.  Monitor cultures.  3.  Left foot and big toe pain.  X-ray stable.  She had hit her foot on the ground when she was having a seizure.  Supportive care.  4.  History of DVT and intracerebral hemorrhage.  IVC filter placement March 2022.  SCDs for DVT prophylaxis and supportive care.  5.  COPD.  Stable.  6.  History of paroxysmal atrial fibrillation [place] vas 2 score of 1 - continue low-dose Lopressor.  7.  Clinical dehydration.  Gentle IV fluids.  8.  Mild macrocytosis.  Check B12, folate and TSH.  Could be due to her seizure medications.      Condition - Extremely Guarded  Family Communication  :  daughter [name] - [phone] - 02/12/21  Code Status :  Full  Consults  :  Neuro  PUD Prophylaxis : PPI   Procedures  :     CT - 1. Diminished exam detail due to motion artifact. Images through the convexity of the cerebral hemispheres, inferior temporal lobes, inferior frontal lobe and inferior cerebellum are essentially nondiagnostic. Within this limitation there are no signs to suggest acute infarct, hemorrhage or mass. 2. Large area of encephalomalacia involving the right parieto-occipital lobe with overlying craniotomy defect. 3. Small vessel ischemic disease and brain atrophy.      Disposition Plan  :    Status is: Inpatient  Remains inpatient appropriate because:IV treatments appropriate due to intensity of illness or inability to take PO  Dispo: The patient is from: Home              Anticipated d/c is to: Home               Patient currently is not medically stable to d/c.   Difficult to place patient No   DVT Prophylaxis  :    SCD  Lab Results  Component Value Date   PLT 157 02/11/2021    Diet :  Diet Order             Diet regular Room service appropriate? Yes; Fluid consistency: Thin  Diet effective now                    Inpatient Medications  Scheduled Meds:  Cenobamate  1 tablet Oral Daily   cephALEXin  500 mg Oral BID   cholecalciferol  1,000 Units Oral Daily   escitalopram  10 mg Oral QHS   gabapentin  300 mg Oral QHS   lacosamide  200 mg Oral BID   pantoprazole  40 mg Oral Daily   phenytoin  100 mg Oral BID   phenytoin  30 mg Oral QHS   vitamin B-12  1,000 mcg Oral Daily   Continuous Infusions:  lactated ringers     PRN Meds:.acetaminophen **OR** [DISCONTINUED] acetaminophen, diclofenac Sodium  Antibiotics  :    Anti-infectives (From admission, onward)    Start     Dose/Rate Route Frequency Ordered Stop   02/11/21 2200  cephALEXin (KEFLEX) capsule 500 mg        500 mg Oral 2 times daily 02/11/21 1741          Time Spent in minutes  30   Lala Lund M.D on 02/12/2021 at 8:59 AM  To page go to [email or web address]   [facility] -  Office  [phone]    See all Orders from today for  further details    Objective:   Vitals:   02/12/21 0500 02/12/21 0600 02/12/21 0715 02/12/21 0815  BP: 120/61 121/67 124/65 121/80  Pulse: 76 73 75 (!) 105  Resp: (!) 24 19 10 16   Temp:      TempSrc:      SpO2: 95% 96% 95% 94%    Wt Readings from Last 3 Encounters:  01/28/21 63.9 kg  12/13/20 62.5 kg  10/25/20 59.9 kg    No intake or output data in the 24 hours ending 02/12/21 0859   Physical Exam  Awake Alert, No new F.N deficits, Normal affect [place].AT,PERRAL Supple Neck,No JVD, No cervical lymphadenopathy appriciated.  Symmetrical Chest wall movement, Good air movement bilaterally, CTAB RRR,No Gallops,Rubs or new Murmurs, No Parasternal Heave +ve  B.Sounds, Abd Soft, No tenderness, No organomegaly appriciated, No rebound - guarding or rigidity. No Cyanosis, Clubbing or edema, No new Rash or bruise        Data Review:    CBC Recent Labs  Lab 02/09/21 1519 02/11/21 0945  WBC 6.5 4.6  HGB 12.9 13.5  HCT 39.7 41.3  PLT 149* 157  MCV 103.7* 102.7*  MCH 33.7 33.6  MCHC 32.5 32.7  RDW 13.1 12.9  LYMPHSABS 1.1 1.0  MONOABS 0.3 0.3  EOSABS 0.0 0.1  BASOSABS 0.0 0.0    Recent Labs  Lab 02/09/21 1519 02/11/21 0945 02/12/21 0430  NA 140 141 140  K 3.9 3.1* 4.3  CL 105 106 106  CO2 27 23 22   GLUCOSE 113* 106* 96  BUN 12 9 8   CREATININE 0.75 0.78 0.74  CALCIUM 9.0 9.2 8.4*  AST 29 47*  --   ALT 18 22  --   ALKPHOS 58 54  --   BILITOT 0.7 0.8  --   ALBUMIN 4.1 3.9  --   MG  --   --  2.2    ------------------------------------------------------------------------------------------------------------------ No results for input(s): CHOL, HDL, LDLCALC, TRIG, CHOLHDL, LDLDIRECT in the last 72 hours.  Lab Results  Component Value Date   HGBA1C 5.0 08/02/2020   ------------------------------------------------------------------------------------------------------------------ No results for input(s): TSH, T4TOTAL, T3FREE, THYROIDAB in the last 72 hours.  Invalid input(s): FREET3  Cardiac Enzymes No results for input(s): CKMB, TROPONINI, MYOGLOBIN in the last 168 hours.  Invalid input(s): CK ------------------------------------------------------------------------------------------------------------------    Component Value Date/Time   BNP 49.6 08/26/2019 0322    Micro Results Recent Results (from the past 240 hour(s))  SARS CORONAVIRUS 2 (TAT 6-24 HRS) Nasopharyngeal Nasopharyngeal Swab     Status: None   Collection Time: 02/11/21  5:45 PM   Specimen: Nasopharyngeal Swab  Result Value Ref Range Status   SARS Coronavirus 2 NEGATIVE NEGATIVE Final    Comment: (NOTE) SARS-CoV-2 target nucleic acids are NOT  DETECTED.  The SARS-CoV-2 RNA is generally detectable in upper and lower respiratory specimens during the acute phase of infection. Negative results do not preclude SARS-CoV-2 infection, do not rule out co-infections with other pathogens, and should not be used as the sole basis for treatment or other patient management decisions. Negative results must be combined with clinical observations, patient history, and epidemiological information. The expected result is Negative.  Fact Sheet for Patients: SugarRoll.be  Fact Sheet for Healthcare Providers: https://www.woods-mathews.com/  This test is not yet approved or cleared by the [place] FDA and  has been authorized for detection and/or diagnosis of SARS-CoV-2 by FDA under an Emergency Use Authorization (EUA). This EUA will remain  in effect (meaning this test can  be used) for the duration of the COVID-19 declaration under Se ction 564(b)(1) of the Act, 21 U.S.C. section 360bbb-3(b)(1), unless the authorization is terminated or revoked sooner.  Performed at [facility], [place] [address] [place], [place] [address]     Radiology Reports  CT Head Wo Contrast  Result Date: 02/11/2021 CLINICAL DATA:  Tremors to entire body and difficulty ambulating. Status post fall this morning. EXAM: CT HEAD WITHOUT CONTRAST TECHNIQUE: Contiguous axial images were obtained from the base of the skull through the vertex without intravenous contrast. COMPARISON:  08/10/2020 FINDINGS: Brain: Diminished exam detail due to motion artifact. Images through the convexity of the cerebral hemispheres, inferior temporal lobes, inferior frontal lobe and inferior cerebellum are essentially nondiagnostic. There is a large area of encephalomalacia involving the right parieto-occipital lobe. Unchanged from previous exam. Prominence of the sulci and ventricles compatible with brain atrophy. Vascular: No hyperdense vessel  or unexpected calcification. Skull: Status post right parietal craniotomy.  No acute abnormality. Sinuses/Orbits: No acute finding. Other: None IMPRESSION: 1. Diminished exam detail due to motion artifact. Images through the convexity of the cerebral hemispheres, inferior temporal lobes, inferior frontal lobe and inferior cerebellum are essentially nondiagnostic. Within this limitation there are no signs to suggest acute infarct, hemorrhage or mass. 2. Large area of encephalomalacia involving the right parieto-occipital lobe with overlying craniotomy defect. 3. Small vessel ischemic disease and brain atrophy. Electronically Signed   By: Kerby Moors M.D.   On: 02/11/2021 10:14   DG Knee Complete 4 Views Left  Result Date: 02/09/2021 CLINICAL DATA:  Recent fall following seizure activity with left knee pain, initial encounter EXAM: LEFT KNEE - COMPLETE 4+ VIEW COMPARISON:  None. FINDINGS: No evidence of fracture, dislocation, or joint effusion. No evidence of arthropathy or other focal bone abnormality. Soft tissues are unremarkable. IMPRESSION: No acute abnormality noted. Electronically Signed   By: Inez Catalina M.D.   On: 02/09/2021 20:06   DG Foot Complete Left  Result Date: 02/11/2021 CLINICAL DATA:  65 year old female with left foot pain. EXAM: LEFT FOOT - COMPLETE 3+ VIEW COMPARISON:  None. FINDINGS: There is no acute fracture or dislocation. The bones are osteopenic. Old healed fracture deformity of the fifth metatarsal. The soft tissues are unremarkable. IMPRESSION: No acute fracture or dislocation. Electronically Signed   By: Anner Crete M.D.   On: 02/11/2021 19:25   DG Hip Unilat With Pelvis 2-3 Views Left  Result Date: 02/09/2021 CLINICAL DATA:  Recent seizure activity with fall and left hip pain, initial encounter EXAM: DG HIP (WITH OR WITHOUT PELVIS) 3V LEFT COMPARISON:  None. FINDINGS: Pelvic ring is intact. Minimal degenerative changes of the hip joints are noted bilaterally. No acute  fracture or dislocation is noted. No soft tissue abnormality is seen. IMPRESSION: No acute abnormality noted. Electronically Signed   By: Inez Catalina M.D.   On: 02/09/2021 20:07

## 2021-02-13 DIAGNOSIS — N390 Urinary tract infection, site not specified: Secondary | ICD-10-CM

## 2021-02-13 DIAGNOSIS — E876 Hypokalemia: Secondary | ICD-10-CM

## 2021-02-13 DIAGNOSIS — F17200 Nicotine dependence, unspecified, uncomplicated: Secondary | ICD-10-CM

## 2021-02-13 DIAGNOSIS — I629 Nontraumatic intracranial hemorrhage, unspecified: Secondary | ICD-10-CM

## 2021-02-13 DIAGNOSIS — G40909 Epilepsy, unspecified, not intractable, without status epilepticus: Secondary | ICD-10-CM | POA: Diagnosis not present

## 2021-02-13 DIAGNOSIS — D696 Thrombocytopenia, unspecified: Secondary | ICD-10-CM

## 2021-02-13 DIAGNOSIS — I480 Paroxysmal atrial fibrillation: Secondary | ICD-10-CM

## 2021-02-13 DIAGNOSIS — J449 Chronic obstructive pulmonary disease, unspecified: Secondary | ICD-10-CM | POA: Diagnosis not present

## 2021-02-13 DIAGNOSIS — M79675 Pain in left toe(s): Secondary | ICD-10-CM

## 2021-02-13 DIAGNOSIS — I619 Nontraumatic intracerebral hemorrhage, unspecified: Secondary | ICD-10-CM

## 2021-02-13 DIAGNOSIS — R251 Tremor, unspecified: Secondary | ICD-10-CM

## 2021-02-13 LAB — CBC WITH DIFFERENTIAL/PLATELET
Abs Immature Granulocytes: 0.01 10*3/uL (ref 0.00–0.07)
Basophils Absolute: 0 10*3/uL (ref 0.0–0.1)
Basophils Relative: 0 %
Eosinophils Absolute: 0.1 10*3/uL (ref 0.0–0.5)
Eosinophils Relative: 2 %
HCT: 37.6 % (ref 36.0–46.0)
Hemoglobin: 12.5 g/dL (ref 12.0–15.0)
Immature Granulocytes: 0 %
Lymphocytes Relative: 39 %
Lymphs Abs: 1.9 10*3/uL (ref 0.7–4.0)
MCH: 34.2 pg — ABNORMAL HIGH (ref 26.0–34.0)
MCHC: 33.2 g/dL (ref 30.0–36.0)
MCV: 102.7 fL — ABNORMAL HIGH (ref 80.0–100.0)
Monocytes Absolute: 0.5 10*3/uL (ref 0.1–1.0)
Monocytes Relative: 10 %
Neutro Abs: 2.4 10*3/uL (ref 1.7–7.7)
Neutrophils Relative %: 49 %
Platelets: 145 10*3/uL — ABNORMAL LOW (ref 150–400)
RBC: 3.66 MIL/uL — ABNORMAL LOW (ref 3.87–5.11)
RDW: 13.2 % (ref 11.5–15.5)
WBC: 4.9 10*3/uL (ref 4.0–10.5)
nRBC: 0 % (ref 0.0–0.2)

## 2021-02-13 LAB — COMPREHENSIVE METABOLIC PANEL
ALT: 16 U/L (ref 0–44)
AST: 23 U/L (ref 15–41)
Albumin: 3.2 g/dL — ABNORMAL LOW (ref 3.5–5.0)
Alkaline Phosphatase: 53 U/L (ref 38–126)
Anion gap: 6 (ref 5–15)
BUN: 6 mg/dL — ABNORMAL LOW (ref 8–23)
CO2: 29 mmol/L (ref 22–32)
Calcium: 8.8 mg/dL — ABNORMAL LOW (ref 8.9–10.3)
Chloride: 107 mmol/L (ref 98–111)
Creatinine, Ser: 0.75 mg/dL (ref 0.44–1.00)
GFR, Estimated: >60 mL/min (ref >60)
Glucose, Bld: 119 mg/dL — ABNORMAL HIGH (ref 70–99)
Potassium: 3.1 mmol/L — ABNORMAL LOW (ref 3.5–5.1)
Sodium: 142 mmol/L (ref 135–145)
Total Bilirubin: 0.5 mg/dL (ref 0.3–1.2)
Total Protein: 5.8 g/dL — ABNORMAL LOW (ref 6.5–8.1)

## 2021-02-13 LAB — URINE CULTURE: Culture: NO GROWTH

## 2021-02-13 LAB — MAGNESIUM: Magnesium: 2 mg/dL (ref 1.7–2.4)

## 2021-02-13 MED ORDER — POTASSIUM CHLORIDE CRYS ER 20 MEQ PO TBCR
40.0000 meq | EXTENDED_RELEASE_TABLET | Freq: Two times a day (BID) | ORAL | Status: AC
  Administered 2021-02-13 (×2): 40 meq via ORAL
  Filled 2021-02-13 (×2): qty 2

## 2021-02-13 NOTE — Plan of Care (Signed)

## 2021-02-13 NOTE — Evaluation (Signed)
Physical Therapy Evaluation Patient Details Name: [name] MRN: [id number] DOB: August 03, 1956 Today's Date: 02/13/2021   History of Present Illness  65 y/o female presented to ED on 7/11 for complaints of multiple seizures reporting 10-12 seizures in last 3 days. Seen at [facility] on 7/9 for similar complaints. PMH: cerebral hemorrhage 1989, asthma, COPD, DVT, GERD, seizures, CVA, tunnel vision, neuropathy  Clinical Impression  PTA, patient lives with daughter and daughter's family and reports independence with use of RW for mobility. Patient presents with generalized weakness, decreased activity tolerance, impaired balance. Patient reports memory deficits from last 3-4 days since seizures began. Patient currently requiring min guard for safety with mobility with RW due to instability. Patient will benefit from skilled PT services during acute stay to address listed deficits. Recommend HHPT following discharge to maximize functional independence and safety.     Follow Up Recommendations Home health PT;Supervision for mobility/OOB    Equipment Recommendations  None recommended by PT    Recommendations for Other Services       Precautions / Restrictions Precautions Precautions: Fall Precaution Comments: seizure, tunnel vision Restrictions Weight Bearing Restrictions: No      Mobility  Bed Mobility Overal bed mobility: Needs Assistance Bed Mobility: Supine to Sit;Sit to Supine     Supine to sit: Min guard;HOB elevated Sit to supine: Min guard;HOB elevated   General bed mobility comments: min guard for safety    Transfers Overall transfer level: Needs assistance Equipment used: Rolling Quaran Kedzierski (2 wheeled) Transfers: Sit to/from Stand Sit to Stand: Min guard         General transfer comment: min guard for safety  Ambulation/Gait Ambulation/Gait assistance: Min guard Gait Distance (Feet): 20 Feet Assistive device: Rolling Walda Hertzog (2 wheeled) Gait Pattern/deviations: Step-to  pattern;Decreased stride length;Antalgic;Trunk flexed Gait velocity: decreased   General Gait Details: very slow gait speed due to generalized soreness. Min guard for Scientist, research (medical)    Modified Rankin (Stroke Patients Only)       Balance Overall balance assessment: Mild deficits observed, not formally tested                                           Pertinent Vitals/Pain Pain Assessment: Faces Faces Pain Scale: Hurts even more Pain Location: "all over" Pain Descriptors / Indicators: Sore Pain Intervention(s): Monitored during session;Repositioned;Limited activity within patient's tolerance    Home Living Family/patient expects to be discharged to:: Private residence Living Arrangements: Children (daughter's husband and children, her ex mother inlaw) Available Help at Discharge: Family;Available 24 hours/day Type of Home: House Home Access: Level entry     Home Layout: Two level;1/2 bath on main level Home Equipment: Jhalil Silvera - 2 wheels;Other (comment) (chair lift for stairs)      Prior Function Level of Independence: Independent with assistive device(s)         Comments: has RW on both levels     Hand Dominance   Dominant Hand: Right    Extremity/Trunk Assessment   Upper Extremity Assessment Upper Extremity Assessment: Defer to OT evaluation    Lower Extremity Assessment Lower Extremity Assessment: Generalized weakness    Cervical / Trunk Assessment Cervical / Trunk Assessment: Normal  Communication   Communication: No difficulties  Cognition Arousal/Alertness: Awake/alert Behavior During Therapy: WFL for tasks assessed/performed Overall Cognitive Status: Within Functional  Limits for tasks assessed                                 General Comments: memory deficits from recent seizures. Does not recall visit to [facility] ED or events leading up to seizures      General Comments       Exercises     Assessment/Plan    PT Assessment Patient needs continued PT services  PT Problem List Decreased strength;Decreased activity tolerance;Decreased balance;Decreased mobility       PT Treatment Interventions DME instruction;Gait training;Functional mobility training;Therapeutic activities;Therapeutic exercise;Balance training;Patient/family education    PT Goals (Current goals can be found in the Care Plan section)  Acute Rehab PT Goals Patient Stated Goal: to go home PT Goal Formulation: With patient Time For Goal Achievement: 02/27/21 Potential to Achieve Goals: Good    Frequency Min 3X/week   Barriers to discharge        Co-evaluation               AM-PAC PT "6 Clicks" Mobility  Outcome Measure Help needed turning from your back to your side while in a flat bed without using bedrails?: A Little Help needed moving from lying on your back to sitting on the side of a flat bed without using bedrails?: A Little Help needed moving to and from a bed to a chair (including a wheelchair)?: A Little Help needed standing up from a chair using your arms (e.g., wheelchair or bedside chair)?: A Little Help needed to walk in hospital room?: A Little Help needed climbing 3-5 steps with a railing? : A Little 6 Click Score: 18    End of Session Equipment Utilized During Treatment: Gait belt Activity Tolerance: Patient tolerated treatment well Patient left: in bed;with call bell/phone within reach;with bed alarm set Nurse Communication: Mobility status PT Visit Diagnosis: Unsteadiness on feet (R26.81);Muscle weakness (generalized) (M62.81);Other abnormalities of gait and mobility (R26.89)    Time: 1057-1130 PT Time Calculation (min) (ACUTE ONLY): 33 min   Charges:   PT Evaluation $PT Eval Moderate Complexity: 1 Mod PT Treatments $Therapeutic Activity: 8-22 mins        Chayah Mckee A. Gilford Rile PT, DPT Acute Rehabilitation Services Pager [phone] Office  [phone]   Linna Hoff 02/13/2021, 11:42 AM

## 2021-02-13 NOTE — Progress Notes (Signed)
PROGRESS NOTE    [name]  FOY:[id number] DOB: 01-20-1956 DOA: 02/11/2021 PCP: [name], MD   Brief Narrative:  Patient is a 65 year old Caucasian female with a past medical history significant for but not limited to history of cerebral hemorrhage, history of a stroke in the past with chronic mild left-sided weakness, COPD who quit smoking a few years ago, history of DVT with recent IVC filter, history of seizures as well as other comorbidities who was brought to the hospital due to recurrent seizures for last 3 to 4 days noted by her daughter.  In the last few days the patient had 10-12 seizure-like episodes and she missed 1 dose of Dilantin last week and initial breakthrough seizure started on Saturday and she was brought to [facility] where she is stabilized and diagnosed with UTI and given a loading dose of Dilantin and then discharged home.  However she continued to have seizures over the course of Sunday Monday and subsequently brought back to the hospital.  In the ED neurology was consulted and the hospitalist team was requested to admit this patient.  Neurology recommending continuing antiepileptics at this time and felt the seizure was in the setting of UTI and recommend treatment of the UTI and did not change antiepileptics.  PT OT now recommending home health.  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 (HCC)   PAF (paroxysmal atrial fibrillation) (HCC)   Toe pain, left  Breakthrough Seizures and Epilepsy  -Reportedly has had 10-12 seizures in the last few days -Missed a Dose of Dilantin on Friday -Received IV Diantin on Saturday 02/10/21; Current Dilantin Level  was 8.4 -Seizure Precautions  -C/w Cenobamate 100 mg po Daily, Gabapentin 300 mg po qHS, Lacosamide 200 mg po BID, Phenytoin 100 mg po BID and Phenytoin 30 mg po qHS -C/w IV Lorazepam 2 mg prn  -No further Seizures but neurology recommending if has further seziures increase Xcopri to 150 mg po Daily -Likely can D/C in the Am and follow up with Neurology [name] on 05/31/21 -Neurology feels Seizures were in the setting of UTI -PT/OT recommending Home Helath  UTI -Urinalysis showed a hazy appearance with amber color urine, 2020 ketones, negative leukocytes, negative nitrites -Urine Cx Negative -Continues to be on Keflex  Left Foot and Big Toe Pain -X-Ray Stable and she had a hit her foot on the ground when she was having a Seziure -C/w Supportive Care  COPD -Stable and Currently not in Exacerbation  Hx of DVT and Intracerebral Hemorrhage -Recently had IVC Filter placed in March 2022  -C/w SCDs for VTE prophylaxis an supportive Care  PAF -CHA2DS2-VASc of 1 -SCDS -continue to Monitor on Telemetry -C/w BB  Macrocytosis without Anemia -TSH was 1.963 -Hgb/Hct was 12.5/37.6 and MCV was 102.7 -Folate was 7.5 and B12 was 208  -Continue to Monitor and Trend -Repeat CBC in the AM  GERD -C/w PPI with Pantoprazole 40 mg po Daily  Hypokalemia -Patient's K+ this AM was 3.1 -Replete with po Kcl 40 mEQ BID x2 -Continue to Monitor and Replete as Necessary -Repeat CMP in the AM   Thrombocytopenia -Patient's Platelet Count went from 149 -> 157 ->  145 -Continue to Monitor for S/Sx of Bleeding -Repeat CBC in the AM   DVT prophylaxis: SCDs Code Status: FULL CODE  Family Communication: No family present at bedside  Disposition Plan: Home with Home PT in the AM   Status is: Inpatient  Remains inpatient appropriate because:Unsafe d/c plan and Inpatient level of care appropriate due to severity of illness  Dispo: The patient is from: Home              Anticipated d/c is to:  Home              Patient currently is not medically stable to d/c.   Difficult to place patient No   Consultants:  Neurology Dr. Zeb Comfort  Procedures: EEG  Antimicrobials:  Anti-infectives (From admission, onward)    Start     Dose/Rate Route Frequency Ordered Stop   02/11/21 2200  cephALEXin (KEFLEX) capsule 500 mg        500 mg Oral 2 times daily 02/11/21 1741          Subjective: Seen and examined at bedside and states that she is sore.  Denies any chest pain, lightheadedness or dizziness.  No other concerns or complaints this time but has not had any seizures and is just complaining of easy bruising.  Objective: Vitals:   02/12/21 1939 02/13/21 0007 02/13/21 0335 02/13/21 0713  BP: 119/82 (!) 104/55 (!) 95/56 (!) 97/55  Pulse: 83 73 (!) 55 73  Resp: 18 20 19 14   Temp: 98.5 F (36.9 C) 98.1 F (36.7 C) (!) 97.5 F (36.4 C) 98.7 F (37.1 C)  TempSrc: Oral Oral Oral Oral  SpO2: 98% 94% 95% 95%  Weight: 63.9 kg     Height: 5\' 7"  (1.702 m)       Intake/Output Summary (Last 24 hours) at 02/13/2021 0758 Last data filed at 02/13/2021 0620 Gross per 24 hour  Intake 1282.9 ml  Output --  Net 1282.9 ml   Filed Weights   02/12/21 1939  Weight: 63.9 kg   Examination: Physical Exam:  Constitutional: Thin Caucasian female currently in no acute distress appears calm  eyes: Lids and conjunctivae normal, sclerae anicteric  ENMT: External Ears, Nose appear normal. Grossly normal hearing. Neck: Appears normal, supple, no cervical masses, normal ROM, no appreciable thyromegaly; no JVD Respiratory: Diminished to auscultation bilaterally, no wheezing, rales, rhonchi or crackles. Normal respiratory effort and patient is not tachypenic. No accessory muscle use.  Unlabored breathing Cardiovascular: RRR, no murmurs / rubs / gallops. S1 and S2 auscultated.  No appreciable extremity edema  abdomen: Soft, non-tender, non-distended. Bowel sounds positive.  GU:  Deferred. Musculoskeletal: No clubbing / cyanosis of digits/nails. No joint deformity upper and lower extremities. .  Skin: Has some bruising in the upper extremities. No induration; Warm and dry.  Neurologic: CN 2-12 grossly intact with no focal deficits. Romberg sign and cerebellar reflexes not assessed.  Psychiatric: Normal judgment and insight. Alert and oriented x 3. Normal mood and appropriate affect.   Data Reviewed: I have personally reviewed following labs and imaging studies  CBC: Recent Labs  Lab 02/09/21 1519 02/11/21 0945 02/13/21 0337  WBC 6.5 4.6 4.9  NEUTROABS 5.1 3.2 2.4  HGB 12.9 13.5 12.5  HCT 39.7 41.3 37.6  MCV 103.7* 102.7* 102.7*  PLT 149* 157 037*   Basic Metabolic Panel: Recent Labs  Lab 02/09/21 1519 02/11/21 0945 02/12/21 0430 02/13/21 0337  NA 140 141 140 142  K 3.9 3.1* 4.3 3.1*  CL 105 106 106 107  CO2 27 23 22 29   GLUCOSE 113* 106* 96 119*  BUN 12 9 8  6*  CREATININE 0.75 0.78 0.74 0.75  CALCIUM 9.0 9.2 8.4* 8.8*  MG  --   --  2.2 2.0   GFR: Estimated Creatinine Clearance: 70.9 mL/min (by C-G formula based on SCr of 0.75 mg/dL). Liver Function Tests: Recent Labs  Lab 02/09/21 1519 02/11/21 0945 02/13/21 0337  AST 29 47* 23  ALT 18 22 16   ALKPHOS 58 54 53  BILITOT 0.7 0.8 0.5  PROT 7.1 6.5 5.8*  ALBUMIN 4.1 3.9 3.2*   No results for input(s): LIPASE, AMYLASE in the last 168 hours. No results for input(s): AMMONIA in the last 168 hours. Coagulation Profile: No results for input(s): INR, PROTIME in the last 168 hours. Cardiac Enzymes: No results for input(s): CKTOTAL, CKMB, CKMBINDEX, TROPONINI in the last 168 hours. BNP (last 3 results) No results for input(s): PROBNP in the last 8760 hours. HbA1C: No results for input(s): HGBA1C in the last 72 hours. CBG: No results for input(s): GLUCAP in the last 168 hours. Lipid Profile: No results for input(s): CHOL, HDL, LDLCALC, TRIG, CHOLHDL, LDLDIRECT in the last 72 hours. Thyroid  Function Tests: Recent Labs    02/12/21 0929  TSH 1.963   Anemia Panel: Recent Labs    02/12/21 0430  VITAMINB12 208  FOLATE 7.5   Sepsis Labs: No results for input(s): PROCALCITON, LATICACIDVEN in the last 168 hours.  Recent Results (from the past 240 hour(s))  SARS CORONAVIRUS 2 (TAT 6-24 HRS) Nasopharyngeal Nasopharyngeal Swab     Status: None   Collection Time: 02/11/21  5:45 PM   Specimen: Nasopharyngeal Swab  Result Value Ref Range Status   SARS Coronavirus 2 NEGATIVE NEGATIVE Final    Comment: (NOTE) SARS-CoV-2 target nucleic acids are NOT DETECTED.  The SARS-CoV-2 RNA is generally detectable in upper and lower respiratory specimens during the acute phase of infection. Negative results do not preclude SARS-CoV-2 infection, do not rule out co-infections with other pathogens, and should not be used as the sole basis for treatment or other patient management decisions. Negative results must be combined with clinical observations, patient history, and epidemiological information. The expected result is Negative.  Fact Sheet for Patients: SugarRoll.be  Fact Sheet for Healthcare Providers: https://www.woods-mathews.com/  This test is not yet approved or cleared by the [place] FDA and  has been authorized for detection and/or diagnosis of SARS-CoV-2 by FDA under an Emergency Use Authorization (EUA). This EUA will remain  in effect (meaning this test can be used) for the duration of the COVID-19 declaration under Se ction 564(b)(1) of the Act, 21 U.S.C. section 360bbb-3(b)(1), unless the authorization is terminated or revoked sooner.  Performed at [facility], [place] [address] [place], [place] [address]     RN Pressure Injury Documentation:     Estimated body mass index is 22.06 kg/m as calculated from the following:   Height as of this encounter: 5\' 7"  (1.702 m).   Weight as of this encounter: 63.9  kg.  Malnutrition Type:   Malnutrition Characteristics:   Nutrition Interventions:   Radiology Studies: CT Head Wo Contrast  Result Date: 02/11/2021 CLINICAL DATA:  Tremors to entire body and difficulty ambulating. Status post fall this morning. EXAM: CT HEAD WITHOUT CONTRAST TECHNIQUE: Contiguous axial images were obtained from the base of the skull through the vertex without intravenous contrast. COMPARISON:  08/10/2020 FINDINGS: Brain: Diminished exam detail due  to motion artifact. Images through the convexity of the cerebral hemispheres, inferior temporal lobes, inferior frontal lobe and inferior cerebellum are essentially nondiagnostic. There is a large area of encephalomalacia involving the right parieto-occipital lobe. Unchanged from previous exam. Prominence of the sulci and ventricles compatible with brain atrophy. Vascular: No hyperdense vessel or unexpected calcification. Skull: Status post right parietal craniotomy.  No acute abnormality. Sinuses/Orbits: No acute finding. Other: None IMPRESSION: 1. Diminished exam detail due to motion artifact. Images through the convexity of the cerebral hemispheres, inferior temporal lobes, inferior frontal lobe and inferior cerebellum are essentially nondiagnostic. Within this limitation there are no signs to suggest acute infarct, hemorrhage or mass. 2. Large area of encephalomalacia involving the right parieto-occipital lobe with overlying craniotomy defect. 3. Small vessel ischemic disease and brain atrophy. Electronically Signed   By: Kerby Moors M.D.   On: 02/11/2021 10:14   DG Foot Complete Left  Result Date: 02/11/2021 CLINICAL DATA:  65 year old female with left foot pain. EXAM: LEFT FOOT - COMPLETE 3+ VIEW COMPARISON:  None. FINDINGS: There is no acute fracture or dislocation. The bones are osteopenic. Old healed fracture deformity of the fifth metatarsal. The soft tissues are unremarkable. IMPRESSION: No acute fracture or dislocation.  Electronically Signed   By: Anner Crete M.D.   On: 02/11/2021 19:25    Scheduled Meds:  Cenobamate  1 tablet Oral Daily   cephALEXin  500 mg Oral BID   cholecalciferol  1,000 Units Oral Daily   escitalopram  10 mg Oral QHS   gabapentin  300 mg Oral QHS   lacosamide  200 mg Oral BID   metoprolol tartrate  25 mg Oral BID   pantoprazole  40 mg Oral Daily   phenytoin  100 mg Oral BID   phenytoin  30 mg Oral QHS   potassium chloride  40 mEq Oral BID   vitamin B-12  1,000 mcg Oral Daily   Continuous Infusions:  lactated ringers 75 mL/hr at 02/13/21 1505    LOS: 2 days   Kerney Elbe, DO [facility] PAGER is on [place]  If 7PM-7AM, please contact night-coverage [email or web address]

## 2021-02-13 NOTE — Evaluation (Signed)
Occupational Therapy Evaluation Patient Details Name: [name] MRN: [id number] DOB: 10/24/1955 Today's Date: 02/13/2021    History of Present Illness 65 y/o female presented to ED on 7/11 for complaints of multiple seizures reporting 10-12 seizures in last 3 days. Seen at [facility] on 7/9 for similar complaints. PMH: cerebral hemorrhage 1989, asthma, COPD, DVT, GERD, seizures, CVA, tunnel vision, neuropathy   Clinical Impression   Patient admitted for the diagnosis above.  PTA she had assist as needed at home from family.  She walked with a RW, but needed no assist for ADL completion.  Her family and SO assist with community mobility.  Barriers are listed below.  No post acute OT is needed, generalized supervision for ADL completion with staff, and PT is seeing her to progress mobility.  No further acute OT needs.     Follow Up Recommendations  No OT follow up    Equipment Recommendations  Tub/shower seat    Recommendations for Other Services       Precautions / Restrictions Precautions Precautions: Fall;Other (comment) Precaution Comments: seizure, tunnel vision Restrictions Weight Bearing Restrictions: No      Mobility Bed Mobility   Bed Mobility: Supine to Sit;Sit to Supine     Supine to sit: Supervision Sit to supine: Supervision     Patient Response: Cooperative  Transfers Overall transfer level: Needs assistance   Transfers: Sit to/from Stand Sit to Stand: Supervision         General transfer comment: safety    Balance Overall balance assessment: Mild deficits observed, not formally tested                                         ADL either performed or assessed with clinical judgement   ADL Overall ADL's : At baseline                                       General ADL Comments: Patient is able to move in room at a supervised level at RW level.  Patient is setup and supervision for ADL completion at a sit/stand level.   No physical assist needed.     Vision Patient Visual Report: No change from baseline       Perception  WFL   Praxis  [facility]    Pertinent Vitals/Pain Pain Assessment: Faces Faces Pain Scale: Hurts a little bit Pain Location: "all over" Pain Descriptors / Indicators: Sore Pain Intervention(s): Monitored during session     Hand Dominance Right   Extremity/Trunk Assessment Upper Extremity Assessment Upper Extremity Assessment: Overall WFL for tasks assessed   Lower Extremity Assessment Lower Extremity Assessment: Defer to PT evaluation   Cervical / Trunk Assessment Cervical / Trunk Assessment: Normal   Communication Communication Communication: No difficulties   Cognition Arousal/Alertness: Awake/alert Behavior During Therapy: WFL for tasks assessed/performed Overall Cognitive Status: Within Functional Limits for tasks assessed                                                      Home Living Family/patient expects to be discharged to:: Private residence Living Arrangements: Children Available Help at Discharge: Family;Available 24  hours/day Type of Home: House Home Access: Level entry     Home Layout: Two level;1/2 bath on main level Alternate Level Stairs-Number of Steps: 15 Alternate Level Stairs-Rails: Right Bathroom Shower/Tub: Teacher, early years/pre: Standard Bathroom Accessibility: Yes How Accessible: Accessible via walker Home Equipment: [place] - 2 wheels;Other (comment)   Additional Comments: has a stair lift      Prior Functioning/Environment Level of Independence: Independent with assistive device(s)        Comments: has RW on both levels, no assist with ADL/IADL        OT Problem List: Impaired balance (sitting and/or standing);Pain      OT Treatment/Interventions:      OT Goals(Current goals can be found in the care plan section) Acute Rehab OT Goals Patient Stated Goal: return home OT Goal  Formulation: With patient Time For Goal Achievement: 02/27/21 Potential to Achieve Goals: Good  OT Frequency:     Barriers to D/C:  None noted          Co-evaluation              AM-PAC OT "6 Clicks" Daily Activity     Outcome Measure Help from another person eating meals?: None Help from another person taking care of personal grooming?: None Help from another person toileting, which includes using toliet, bedpan, or urinal?: None Help from another person bathing (including washing, rinsing, drying)?: None Help from another person to put on and taking off regular upper body clothing?: None Help from another person to put on and taking off regular lower body clothing?: None 6 Click Score: 24   End of Session Equipment Utilized During Treatment: Rolling walker Nurse Communication: Mobility status  Activity Tolerance: Patient tolerated treatment well Patient left: in bed;with call bell/phone within reach;with bed alarm set  OT Visit Diagnosis: Unsteadiness on feet (R26.81)                Time: 0923-3007 OT Time Calculation (min): 22 min Charges:  OT General Charges $OT Visit: 1 Visit OT Evaluation $OT Eval Moderate Complexity: 1 Mod  02/13/2021  Rich, OTR/L  Acute Rehabilitation Services  Office:  [phone]   Metta Clines 02/13/2021, 4:28 PM

## 2021-02-13 NOTE — Progress Notes (Signed)
Admitted to the floor from ED on cart.  Oriented to room and

## 2021-02-13 NOTE — Progress Notes (Signed)
Subjective: No acute events overnight.  No further seizures.  States she has generalized body ache likely due to the seizures  ROS: negative except above  Examination  Vital signs in last 24 hours: Temp:  [97.5 F (36.4 C)-98.7 F (37.1 C)] 98.7 F (37.1 C) (07/13 0713) Pulse Rate:  [55-129] 73 (07/13 0713) Resp:  [14-25] 14 (07/13 0713) BP: (95-129)/(47-82) 97/55 (07/13 0713) SpO2:  [94 %-100 %] 95 % (07/13 0713) Weight:  [63.9 kg] 63.9 kg (07/12 1939)  General: lying in bed,  CVS: pulse-normal rate and rhythm RS: breathing comfortably, CTAB Extremities: normal, warm  Neuro: MS: Alert, oriented, follows commands CN: pupils equal and reactive,  EOMI, face symmetric, tongue midline, normal sensation over face, Motor: 5/5 strength in all 4 extremities Reflexes: 2+ bilaterally over patella, biceps, plantars: flexor Coordination: normal Gait: not tested  Basic Metabolic Panel: Recent Labs  Lab 02/09/21 1519 02/11/21 0945 02/12/21 0430 02/13/21 0337  NA 140 141 140 142  K 3.9 3.1* 4.3 3.1*  CL 105 106 106 107  CO2 27 23 22 29   GLUCOSE 113* 106* 96 119*  BUN 12 9 8  6*  CREATININE 0.75 0.78 0.74 0.75  CALCIUM 9.0 9.2 8.4* 8.8*  MG  --   --  2.2 2.0    CBC: Recent Labs  Lab 02/09/21 1519 02/11/21 0945 02/13/21 0337  WBC 6.5 4.6 4.9  NEUTROABS 5.1 3.2 2.4  HGB 12.9 13.5 12.5  HCT 39.7 41.3 37.6  MCV 103.7* 102.7* 102.7*  PLT 149* 157 145*     Coagulation Studies: No results for input(s): LABPROT, INR in the last 72 hours.  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    Medical intractable epilepsy with breakthrough seizures UTI -Breakthrough seizure most likely due to UTI   Recommendations: -Even though  patient's phenytoin level was subtherapeutic, on review of Dr. Amparo Bristol note patient has side effects with higher doses of Dilantin.  Therefore recommend continuing Dilantin at home dose 100 mg nightly and 130 mg nightly -Continue Vimpat 200 mg twice daily -Continue Xcopri 100 mg daily  -Seizures have been in the setting of UTI, therefore will hold off on adjusting AEDs -However, if patient has any further seizures, can increase Xcopri to 150 mg daily.   -Continue seizure precautions -As needed IV Ativan 2 mg for clinical seizure-like activity -Follow-up with [name] scheduled on 05/31/2021   I have spent a total of  25 minutes with the patient reviewing hospital notes,  test results, labs and examining the patient as well as establishing an assessment and plan that was discussed personally with the patient.  > 50% of time was spent in direct patient care.   Zeb Comfort Epilepsy [facility] For questions after 5pm please refer to AMION to reach the Neurologist on call

## 2021-02-14 DIAGNOSIS — I629 Nontraumatic intracranial hemorrhage, unspecified: Secondary | ICD-10-CM | POA: Diagnosis not present

## 2021-02-14 DIAGNOSIS — J449 Chronic obstructive pulmonary disease, unspecified: Secondary | ICD-10-CM | POA: Diagnosis not present

## 2021-02-14 DIAGNOSIS — I480 Paroxysmal atrial fibrillation: Secondary | ICD-10-CM | POA: Diagnosis not present

## 2021-02-14 DIAGNOSIS — E876 Hypokalemia: Secondary | ICD-10-CM | POA: Diagnosis not present

## 2021-02-14 LAB — COMPREHENSIVE METABOLIC PANEL
ALT: 15 U/L (ref 0–44)
AST: 16 U/L (ref 15–41)
Albumin: 3 g/dL — ABNORMAL LOW (ref 3.5–5.0)
Alkaline Phosphatase: 50 U/L (ref 38–126)
Anion gap: 5 (ref 5–15)
BUN: <5 mg/dL — ABNORMAL LOW (ref 8–23)
CO2: 28 mmol/L (ref 22–32)
Calcium: 8.8 mg/dL — ABNORMAL LOW (ref 8.9–10.3)
Chloride: 108 mmol/L (ref 98–111)
Creatinine, Ser: 0.65 mg/dL (ref 0.44–1.00)
GFR, Estimated: >60 mL/min (ref >60)
Glucose, Bld: 91 mg/dL (ref 70–99)
Potassium: 3.7 mmol/L (ref 3.5–5.1)
Sodium: 141 mmol/L (ref 135–145)
Total Bilirubin: 0.5 mg/dL (ref 0.3–1.2)
Total Protein: 5.5 g/dL — ABNORMAL LOW (ref 6.5–8.1)

## 2021-02-14 LAB — CBC WITH DIFFERENTIAL/PLATELET
Abs Immature Granulocytes: 0.01 10*3/uL (ref 0.00–0.07)
Basophils Absolute: 0 10*3/uL (ref 0.0–0.1)
Basophils Relative: 0 %
Eosinophils Absolute: 0.1 10*3/uL (ref 0.0–0.5)
Eosinophils Relative: 1 %
HCT: 36.3 % (ref 36.0–46.0)
Hemoglobin: 11.8 g/dL — ABNORMAL LOW (ref 12.0–15.0)
Immature Granulocytes: 0 %
Lymphocytes Relative: 36 %
Lymphs Abs: 1.8 10*3/uL (ref 0.7–4.0)
MCH: 33.5 pg (ref 26.0–34.0)
MCHC: 32.5 g/dL (ref 30.0–36.0)
MCV: 103.1 fL — ABNORMAL HIGH (ref 80.0–100.0)
Monocytes Absolute: 0.4 10*3/uL (ref 0.1–1.0)
Monocytes Relative: 9 %
Neutro Abs: 2.7 10*3/uL (ref 1.7–7.7)
Neutrophils Relative %: 54 %
Platelets: 149 10*3/uL — ABNORMAL LOW (ref 150–400)
RBC: 3.52 MIL/uL — ABNORMAL LOW (ref 3.87–5.11)
RDW: 13.6 % (ref 11.5–15.5)
WBC: 5 10*3/uL (ref 4.0–10.5)
nRBC: 0 % (ref 0.0–0.2)

## 2021-02-14 LAB — PHOSPHORUS: Phosphorus: 3.1 mg/dL (ref 2.5–4.6)

## 2021-02-14 LAB — MAGNESIUM: Magnesium: 1.9 mg/dL (ref 1.7–2.4)

## 2021-02-14 MED ORDER — FOLIC ACID 1 MG PO TABS: 1.0000 mg | ORAL_TABLET | Freq: Every day | ORAL | Status: DC

## 2021-02-14 MED ORDER — METOPROLOL TARTRATE 25 MG PO TABS: 25.0000 mg | ORAL_TABLET | Freq: Two times a day (BID) | ORAL | 0 refills | Status: DC

## 2021-02-14 MED ORDER — PANTOPRAZOLE SODIUM 40 MG PO TBEC: 40.0000 mg | DELAYED_RELEASE_TABLET | Freq: Every day | ORAL | 0 refills | Status: DC

## 2021-02-14 MED ORDER — DICLOFENAC SODIUM 1 % EX GEL: 2.0000 g | Freq: Three times a day (TID) | CUTANEOUS | 0 refills | Status: DC | PRN

## 2021-02-14 MED ORDER — FOLIC ACID 1 MG PO TABS: 1.0000 mg | ORAL_TABLET | Freq: Every day | ORAL | 0 refills | Status: DC

## 2021-02-14 MED ORDER — CEPHALEXIN 500 MG PO CAPS: 500.0000 mg | ORAL_CAPSULE | Freq: Two times a day (BID) | ORAL | 0 refills | Status: AC

## 2021-02-14 NOTE — Plan of Care (Signed)
NO SEIZURES OBSERVED, NO SEIZURE ACTIVITY WITNESSED. OOB TO BR WITH ASSIST, NO OBVIOUS WEAKNESS/IMBALANCE RELATED DEFICITS.

## 2021-02-14 NOTE — Progress Notes (Signed)
Physical Therapy Treatment Patient Details Name: [name] MRN: [id number] DOB: [birth year] Today's Date: 02/14/2021    History of Present Illness 65 y/o female presented to ED on 7/11 for complaints of multiple seizures reporting 10-12 seizures in last 3 days. Seen at [facility] on 7/9 for similar complaints. PMH: cerebral hemorrhage 1989, asthma, COPD, DVT, GERD, seizures, CVA, tunnel vision, neuropathy    PT Comments    Patient progressing towards physical therapy goals. Patient overall functioning at supervision level for safety with RW. Patient with complaints of L knee pain after short ambulation distance requiring return to bed. Patient continues to be limited by generalized weakness and pain. Continue to recommend HHPT following discharge to maximize functional independence and address balance and strength deficits.     Follow Up Recommendations  Home health PT;Supervision for mobility/OOB     Equipment Recommendations  None recommended by PT    Recommendations for Other Services       Precautions / Restrictions Precautions Precautions: Fall;Other (comment) Precaution Comments: seizure, tunnel vision Restrictions Weight Bearing Restrictions: No    Mobility  Bed Mobility Overal bed mobility: Needs Assistance Bed Mobility: Supine to Sit;Sit to Supine     Supine to sit: Supervision Sit to supine: Supervision        Transfers Overall transfer level: Needs assistance Equipment used: Rolling Mekenzie Modeste (2 wheeled) Transfers: Sit to/from Stand Sit to Stand: Supervision         General transfer comment: supervision for safety  Ambulation/Gait Ambulation/Gait assistance: Supervision Gait Distance (Feet): 25 Feet Assistive device: Rolling Elius Etheredge (2 wheeled) Gait Pattern/deviations: Step-to pattern;Decreased stride length;Antalgic;Trunk flexed Gait velocity: decreased   General Gait Details: slow gait speed with supervision for safety. complaints of L knee pain during  ambulation requesting to return to bed   Stairs             Wheelchair Mobility    Modified Rankin (Stroke Patients Only)       Balance Overall balance assessment: Mild deficits observed, not formally tested                                          Cognition Arousal/Alertness: Awake/alert Behavior During Therapy: WFL for tasks assessed/performed Overall Cognitive Status: Within Functional Limits for tasks assessed                                        Exercises      General Comments        Pertinent Vitals/Pain Pain Assessment: Faces Faces Pain Scale: Hurts little more Pain Location: L knee with ambulation Pain Descriptors / Indicators: Aching;Discomfort;Grimacing Pain Intervention(s): Monitored during session;Limited activity within patient's tolerance    Home Living                      Prior Function            PT Goals (current goals can now be found in the care plan section) Acute Rehab PT Goals Patient Stated Goal: return home PT Goal Formulation: With patient Time For Goal Achievement: 02/27/21 Potential to Achieve Goals: Good Progress towards PT goals: Progressing toward goals    Frequency    Min 3X/week      PT Plan Current plan remains appropriate    Co-evaluation  AM-PAC PT "6 Clicks" Mobility   Outcome Measure  Help needed turning from your back to your side while in a flat bed without using bedrails?: A Little Help needed moving from lying on your back to sitting on the side of a flat bed without using bedrails?: A Little Help needed moving to and from a bed to a chair (including a wheelchair)?: A Little Help needed standing up from a chair using your arms (e.g., wheelchair or bedside chair)?: A Little Help needed to walk in hospital room?: A Little Help needed climbing 3-5 steps with a railing? : A Little 6 Click Score: 18    End of Session Equipment Utilized  During Treatment: Gait belt Activity Tolerance: Patient tolerated treatment well Patient left: in bed;with call bell/phone within reach;with bed alarm set Nurse Communication: Mobility status PT Visit Diagnosis: Unsteadiness on feet (R26.81);Muscle weakness (generalized) (M62.81);Other abnormalities of gait and mobility (R26.89)     Time: 9747-1855 PT Time Calculation (min) (ACUTE ONLY): 12 min  Charges:  $Therapeutic Activity: 8-22 mins                     [name] A. Gilford Rile PT, DPT Acute Rehabilitation Services Pager [phone] Office [phone]    Linna Hoff 02/14/2021, 11:27 AM

## 2021-02-14 NOTE — TOC Initial Note (Signed)
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 Patient lives with daughter.  Has PCP. Will need to follow up with PCP and neurology post hospitalization. Recommended PT home health. For now. May progress. CM will follow for needs.  Expected Discharge Plan: [place] Barriers to Discharge: Continued Medical Work up   Patient Goals and CMS Choice        Expected Discharge Plan and Services Expected Discharge Plan: [place]                                              Prior Living Arrangements/Services     Patient language and need for interpreter reviewed:: Yes        Need for Family Participation in Patient Care: Yes (Comment) Care giver support system in place?: Yes (comment)   Criminal Activity/Legal Involvement Pertinent to Current Situation/Hospitalization: No - Comment as needed  Activities of Daily Living Home Assistive Devices/Equipment: None ADL Screening (condition at time of admission) Patient's cognitive ability adequate to safely complete daily activities?: Yes Is the patient deaf or have difficulty hearing?: No Does the patient have difficulty seeing, even when wearing glasses/contacts?: Yes Does the patient have difficulty concentrating, remembering, or making decisions?: No Patient able to express need for assistance with ADLs?: Yes Does the patient have difficulty dressing or bathing?: No Independently performs ADLs?: Yes (appropriate for developmental age) Does the patient have difficulty walking or climbing stairs?: No Weakness of Legs: None Weakness of Arms/Hands: None  Permission Sought/Granted                   Emotional Assessment       Orientation: : Oriented to Self, Oriented to Place   Psych Involvement: No (comment)  Admission diagnosis:  Seizure ([place]) [R56.9] Seizures ([place]) [R56.9] Patient Active Problem List   Diagnosis Date Noted   Toe pain, left 02/11/2021   DVT (deep venous thrombosis) ([place]) 10/10/2020   Frequent falls 10/10/2020   Dysuria 10/10/2020   PAF (paroxysmal atrial fibrillation) ([place]) 10/10/2020   Fall 08/02/2020   Atrial flutter ([place])    COVID-19 virus infection 04/02/2020   Lactic acidosis 04/02/2020   Ataxia 08/26/2019   Elevated Dilantin level 08/26/2019   Closed fracture of proximal phalanx of great toe 01/04/2019   Closed fracture of fifth metatarsal bone 01/04/2019   Pain in left foot 01/04/2019   Altered mental status    Weakness of both lower extremities    Generalized weakness 05/25/2018   Pain    Chronic back pain    Neuropathy    Orthostasis    Tobacco abuse    Left-sided weakness 03/19/2018   Atypical chest pain 02/17/2017   Localized swelling of lower extremity 02/17/2017   GERD (gastroesophageal reflux disease) 02/17/2017   Costochondritis 02/17/2017   Seizures ([place]) 11/02/2015   Gait disturbance 11/02/2015   Right clavicle fracture 11/02/2015   UTI (urinary tract infection) 11/02/2015   Chest pain 03/31/2015   Acute  respiratory failure with hypoxia (H[facility]) 03/31/2015   SOB (shortness of breath) 03/31/2015   Malnutrition of moderate degree ([place]) 03/31/2015   COPD with exacerbation ([place]) 03/30/2015   Hypoxia 01/18/2015   Chronic obstructive pulmonary disease with acute exacerbation ([place])    Fever 01/17/2015   Cough 01/17/2015   Sepsis secondary to UTI ([place]) 01/17/2015   COPD (chronic obstructive pulmonary disease) ([place])    Unintentional weight loss 01/11/2015   Anxiety 10/22/2014   Asthma 05/15/2014   Seizure ([place]) 05/15/2014   Protein calorie malnutrition ([place]) 05/15/2014   Arteriovenous malformation of brain 11/17/2013    Partial epilepsy with impairment of consciousness, intractable ([place]) 11/17/2013   Stroke due to intracerebral hemorrhage ([place]) 11/17/2013   DVT of leg (deep venous thrombosis) ([place]) 06/19/2013   DDD (degenerative disc disease) 06/12/2013   Paresthesias 06/12/2013   Tremor 06/09/2013   Hypokalemia 01/23/2013   Leukopenia 12/14/2012   Thrombocytopenia- chronic 06/29/2012   Noncompliance with medication treatment due to underuse of medication 06/29/2012   Stress-personal 06/29/2012   Recurrent seizures ([place]) 06/29/2012   Skull fracture ([place]) 02/28/2012   Dilantin toxicity 02/28/2012   Seizure disorder ([place]) 02/28/2012   PCP:  [name], MD Pharmacy:   [facility]  ([place]) - [place], [place] - [address] [place] [address] Phone: [phone] Fax: [place], [place] - [address] AT [place] [address] Phone: [phone] Fax: [phone]     Social Determinants of Health (SDOH) Interventions    Readmission Risk Interventions No flowsheet data found.

## 2021-02-14 NOTE — Discharge Summary (Signed)
Physician Discharge Summary  [name] EHM:[id number] DOB: [birth year] DOA: 02/11/2021  PCP: [name], MD  Admit date: 02/11/2021 Discharge date: 02/14/2021  Admitted From: Home Disposition: Home with Home Health PT  Recommendations for Outpatient Follow-up:  Follow up with PCP in 1-2 weeks  Follow up with Neurology within 1-2 weeks  Please obtain CMP/CBC, Mag, Phos in one week Please follow up on the following pending results:  Home Health: Yes Equipment/Devices: None Recommended by PT  Discharge Condition: Stable CODE STATUS: FULL CODE  Diet recommendation: Regular Diet   Brief/Interim Summary: The Patient is a 65 year old Caucasian female with a past medical history significant for but not limited to history of cerebral hemorrhage, history of a stroke in the past with chronic mild left-sided weakness, COPD who quit smoking a few years ago, history of DVT with recent IVC filter, history of seizures as well as other comorbidities who was brought to the hospital due to recurrent seizures for last 3 to 4 days noted by her daughter.  In the last few days the patient had 10-12 seizure-like episodes and she missed 1 dose of Dilantin last week and initial breakthrough seizure started on Saturday and she was brought to [facility] where she is stabilized and diagnosed with UTI and given a loading dose of Dilantin and then discharged home.  However she continued to have seizures over the course of Sunday Monday and subsequently brought back to the hospital.  In the ED neurology was consulted and the hospitalist team was requested to admit this patient.  Neurology recommending continuing antiepileptics at this time and felt the seizure was in the setting of UTI and recommend treatment of the UTI and did not change antiepileptics.  PT OT now recommending home health.  She is stable to be discharged home at this time as neurology has signed off and they are recommending continuing antiepileptics.   Patient is to follow-up with her primary neurologist in outpatient setting.    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)   PAF (paroxysmal atrial fibrillation) (HCC)   Toe pain, left  Breakthrough Seizures and Epilepsy  -Reportedly has had 10-12 seizures in the last few days -Missed a Dose of Dilantin on Friday -Received IV Diantin on Saturday 02/10/21; Current Dilantin Level was 8.4 -Seizure Precautions  -C/w Cenobamate 100 mg po Daily, Gabapentin 300 mg po qHS, Lacosamide 200 mg po BID, Phenytoin 100 mg po BID and Phenytoin 30 mg po qHS -C/w IV Lorazepam 2 mg prn  -No further Seizures but neurology recommending if has further seziures increase Xcopri to 150 mg po Daily -Likely can D/C in the Am and follow up with Neurology [name] on 05/31/21 -Neurology feels Seizures were in the setting of UTI -PT/OT recommending Home Helath and she is stable to be discharged home today.;  Patient given seizure recommendations as below   UTI -Initial U/A showed Clear Appearance with negative leukocytes, positive nitrites, many bacteria, 6-10 WBCs -Urinalysis showed a hazy appearance with amber color urine, 20 ketones, negative leukocytes, negative nitrites -Urine Cx Negative on the second time but she is already placed on Keflex so urine culture grew out nothing -Continues to be on Keflex and will continue at discharge for  completion of course   Left Foot and Big Toe Pain -X-Ray Stable and she had a hit her foot on the ground when she was having a Seziure -C/w Supportive Care and Voltaren gel   COPD -Stable and Currently not in Exacerbation   Hx of DVT and Intracerebral Hemorrhage -Recently had IVC Filter placed in March  2022 -C/w SCDs for VTE prophylaxis an supportive Care   PAF -CHA2DS2-VASc of 1 -SCDS -continue to Monitor on Telemetry -C/w BB as abve and is now on metoprolol 25 mg twice daily continue at discharge   Macrocytic Anemia  -TSH was 1.963 -Hgb/Hct was 12.5/37.6 and MCV was 102.7 and now Hgb/Hct is 11.8/36.3 and MCV is 103.1 -Folate was 7.5 and B12 was 208 -C/w Cyanocobalamin 1000 mcg po Daily and Folic Acid 1 mg Daily  -Continue to Monitor and Trend -Repeat CBC within 1 week   GERD -C/w PPI with Pantoprazole 40 mg po Daily   Hypokalemia -Patient's K+ was 3.1 and improved to 3.7 -Replete with po Kcl 40 mEQ BID x2 yesterday  -Continue to Monitor and Replete as Necessary -Repeat CMP within 1 week   Thrombocytopenia -Patient's Platelet Count went from 149 -> 157 -> 145 -> 149 -Continue to Monitor for S/Sx of Bleeding -Repeat CBC i within 1 week  Discharge Instructions  Discharge Instructions     Call MD for:  difficulty breathing, headache or visual disturbances   Complete by: As directed    Call MD for:  extreme fatigue   Complete by: As directed    Call MD for:  hives   Complete by: As directed    Call MD for:  persistant dizziness or light-headedness   Complete by: As directed    Call MD for:  persistant nausea and vomiting   Complete by: As directed    Call MD for:  redness, tenderness, or signs of infection (pain, swelling, redness, odor or green/yellow discharge around incision site)   Complete by: As directed    Call MD for:  severe uncontrolled pain   Complete by: As directed    Call MD for:  temperature >100.4   Complete by: As directed    Diet - low sodium heart healthy   Complete by: As directed    Discharge instructions   Complete by: As directed    You were cared for by a hospitalist during your hospital stay. If you have any questions about your discharge medications or the care you received while you were in the hospital after you are discharged, you can  call the unit and ask to speak with the hospitalist on call if the hospitalist that took care of you is not available. Once you are discharged, your primary care physician will handle any further medical issues. Please note that NO REFILLS for any discharge medications will be authorized once you are discharged, as it is imperative that you return to your primary care physician (or establish a relationship with a primary care physician if you do not have one) for your aftercare needs so that they can reassess your need for medications and monitor your lab values.  Follow up with PCP and Neurology as an outpatient. Take all medications as prescribed. If symptoms change or worsen please return to the ED for evaluation   Per [facility] statutes, patients with seizures are not allowed to drive until they have been seizure-free for six months.   Use caution when using heavy equipment or power tools. Avoid working  on ladders or at heights. Take showers instead of baths. Ensure the water temperature is not too high on the home water heater. Do not go swimming alone. Do not lock yourself in a room alone (i.e. bathroom). When caring for infants or small children, sit down when holding, feeding, or changing them to minimize risk of injury to the child in the event you have a seizure. Maintain good sleep hygiene. Avoid alcohol.    If patient has another seizure, call 911 and bring them back to the ED if: A.  The seizure lasts longer than 5 minutes.      B.  The patient doesn't wake shortly after the seizure or has new problems such as difficulty seeing, speaking or moving following the seizure C.  The patient was injured during the seizure D.  The patient has a temperature over 102 F (39C) E.  The patient vomited during the seizure and now is having trouble breathing   Increase activity slowly   Complete by: As directed       Allergies as of 02/14/2021       Reactions   Zonisamide Other (See  Comments)   Numbness and tingling over whole body   Chocolate Flavor Other (See Comments)   Trigger for seizures   Codeine Nausea And Vomiting   Keppra [levetiracetam] Other (See Comments)   Causes seizures   Pregabalin Nausea And Vomiting   Olive Oil Rash        Medication List     TAKE these medications    acetaminophen 500 MG tablet Commonly known as: TYLENOL Take 1,000 mg by mouth every 6 (six) hours as needed for headache.   cephALEXin 500 MG capsule Commonly known as: KEFLEX Take 1 capsule (500 mg total) by mouth 2 (two) times daily for 4 days.   cholecalciferol 25 MCG (1000 UNIT) tablet Commonly known as: VITAMIN D3 Take 1 tablet (1,000 Units total) by mouth daily.   [employer] B-12 500 MCG tablet Generic drug: vitamin B-12 TAKE 2 TABLETS BY MOUTH EVERY DAY What changed: how much to take   diclofenac Sodium 1 % Gel Commonly known as: VOLTAREN Apply 2 g topically 3 (three) times daily as needed (toe pain).   Dilantin 100 MG ER capsule Generic drug: phenytoin Take 1 capsule twice a day What changed:  how much to take how to take this when to take this additional instructions   Dilantin 30 MG ER capsule Generic drug: phenytoin Take 1 capsule every night (take with 100mg  capsule for total of 130mg  every night) What changed:  how much to take how to take this when to take this additional instructions   escitalopram 10 MG tablet Commonly known as: Lexapro Take 1 tablet (10 mg total) by mouth at bedtime.   folic acid 1 MG tablet Commonly known as: FOLVITE Take 1 tablet (1 mg total) by mouth daily.   gabapentin 300 MG capsule Commonly known as: NEURONTIN Take 1 capsule every night What changed:  how much to take how to take this when to take this additional instructions   lacosamide 200 MG Tabs tablet Commonly known as: Vimpat Take 1 tablet (200 mg total) by mouth 2 (two) times daily.   LORazepam 1 MG tablet Commonly known as: Ativan Take 1  tablet (1 mg total) by mouth See admin instructions. Take 1 mg by mouth as directed/as needed for 3 or more seizures/24 hours-  Do not take more than 2 mg in 24 hours  metoprolol tartrate 25 MG tablet Commonly known as: LOPRESSOR Take 1 tablet (25 mg total) by mouth 2 (two) times daily.   pantoprazole 40 MG tablet Commonly known as: PROTONIX Take 1 tablet (40 mg total) by mouth daily. Start taking on: February 15, 2021   Xcopri 100 MG Tabs Generic drug: Cenobamate Take 1 tablet by mouth daily.        Allergies  Allergen Reactions   Zonisamide Other (See Comments)    Numbness and tingling over whole body   Chocolate Flavor Other (See Comments)    Trigger for seizures   Codeine Nausea And Vomiting   Keppra [Levetiracetam] Other (See Comments)    Causes seizures   Pregabalin Nausea And Vomiting   Olive Oil Rash   Consultations: Neurology  Procedures/Studies: CT Head Wo Contrast  Result Date: 02/11/2021 CLINICAL DATA:  Tremors to entire body and difficulty ambulating. Status post fall this morning. EXAM: CT HEAD WITHOUT CONTRAST TECHNIQUE: Contiguous axial images were obtained from the base of the skull through the vertex without intravenous contrast. COMPARISON:  08/10/2020 FINDINGS: Brain: Diminished exam detail due to motion artifact. Images through the convexity of the cerebral hemispheres, inferior temporal lobes, inferior frontal lobe and inferior cerebellum are essentially nondiagnostic. There is a large area of encephalomalacia involving the right parieto-occipital lobe. Unchanged from previous exam. Prominence of the sulci and ventricles compatible with brain atrophy. Vascular: No hyperdense vessel or unexpected calcification. Skull: Status post right parietal craniotomy.  No acute abnormality. Sinuses/Orbits: No acute finding. Other: None IMPRESSION: 1. Diminished exam detail due to motion artifact. Images through the convexity of the cerebral hemispheres, inferior temporal  lobes, inferior frontal lobe and inferior cerebellum are essentially nondiagnostic. Within this limitation there are no signs to suggest acute infarct, hemorrhage or mass. 2. Large area of encephalomalacia involving the right parieto-occipital lobe with overlying craniotomy defect. 3. Small vessel ischemic disease and brain atrophy. Electronically Signed   By: Kerby Moors M.D.   On: 02/11/2021 10:14   DG Knee Complete 4 Views Left  Result Date: 02/09/2021 CLINICAL DATA:  Recent fall following seizure activity with left knee pain, initial encounter EXAM: LEFT KNEE - COMPLETE 4+ VIEW COMPARISON:  None. FINDINGS: No evidence of fracture, dislocation, or joint effusion. No evidence of arthropathy or other focal bone abnormality. Soft tissues are unremarkable. IMPRESSION: No acute abnormality noted. Electronically Signed   By: Inez Catalina M.D.   On: 02/09/2021 20:06   DG Foot Complete Left  Result Date: 02/11/2021 CLINICAL DATA:  65 year old female with left foot pain. EXAM: LEFT FOOT - COMPLETE 3+ VIEW COMPARISON:  None. FINDINGS: There is no acute fracture or dislocation. The bones are osteopenic. Old healed fracture deformity of the fifth metatarsal. The soft tissues are unremarkable. IMPRESSION: No acute fracture or dislocation. Electronically Signed   By: Anner Crete M.D.   On: 02/11/2021 19:25   DG Hip Unilat With Pelvis 2-3 Views Left  Result Date: 02/09/2021 CLINICAL DATA:  Recent seizure activity with fall and left hip pain, initial encounter EXAM: DG HIP (WITH OR WITHOUT PELVIS) 3V LEFT COMPARISON:  None. FINDINGS: Pelvic ring is intact. Minimal degenerative changes of the hip joints are noted bilaterally. No acute fracture or dislocation is noted. No soft tissue abnormality is seen. IMPRESSION: No acute abnormality noted. Electronically Signed   By: Inez Catalina M.D.   On: 02/09/2021 20:07    Subjective: Seen and examined at bedside and was doing much better today in management.  Denies  any lightheadedness or dizziness. A little sore but improving.  No other concerns or complaints at this time and ready to go home.   Discharge Exam: Vitals:   02/14/21 0823 02/14/21 1123  BP: (!) 116/59 (!) 106/56  Pulse: 82 62  Resp: 14 14  Temp: 99.3 F (37.4 C) 99.3 F (37.4 C)  SpO2: 99% 100%   Vitals:   02/14/21 0351 02/14/21 0359 02/14/21 0823 02/14/21 1123  BP: (!) 99/46 (!) 102/54 (!) 116/59 (!) 106/56  Pulse: 67  82 62  Resp: 16  14 14   Temp: 98.7 F (37.1 C)  99.3 F (37.4 C) 99.3 F (37.4 C)  TempSrc: Oral  Oral Oral  SpO2: 97%  99% 100%  Weight:      Height:       General: Pt is alert, awake, not in acute distress Cardiovascular: RRR, S1/S2 +, no rubs, no gallops Respiratory: Mildly diminished bilaterally, no wheezing, no rhonchi Abdominal: Soft, NT, ND, bowel sounds + Extremities: no edema, no cyanosis; has some bruising in the upper extremities  The results of significant diagnostics from this hospitalization (including imaging, microbiology, ancillary and laboratory) are listed below for reference.    Microbiology: Recent Results (from the past 240 hour(s))  SARS CORONAVIRUS 2 (TAT 6-24 HRS) Nasopharyngeal Nasopharyngeal Swab     Status: None   Collection Time: 02/11/21  5:45 PM   Specimen: Nasopharyngeal Swab  Result Value Ref Range Status   SARS Coronavirus 2 NEGATIVE NEGATIVE Final    Comment: (NOTE) SARS-CoV-2 target nucleic acids are NOT DETECTED.  The SARS-CoV-2 RNA is generally detectable in upper and lower respiratory specimens during the acute phase of infection. Negative results do not preclude SARS-CoV-2 infection, do not rule out co-infections with other pathogens, and should not be used as the sole basis for treatment or other patient management decisions. Negative results must be combined with clinical observations, patient history, and epidemiological information. The expected result is Negative.  Fact Sheet for  Patients: SugarRoll.be  Fact Sheet for Healthcare Providers: https://www.woods-mathews.com/  This test is not yet approved or cleared by the [place] FDA and  has been authorized for detection and/or diagnosis of SARS-CoV-2 by FDA under an Emergency Use Authorization (EUA). This EUA will remain  in effect (meaning this test can be used) for the duration of the COVID-19 declaration under Se ction 564(b)(1) of the Act, 21 U.S.C. section 360bbb-3(b)(1), unless the authorization is terminated or revoked sooner.  Performed at [facility], [place] [address] [place], [place] [address]   Culture, Urine     Status: None   Collection Time: 02/11/21 11:01 PM   Specimen: Urine, Random  Result Value Ref Range Status   Specimen Description URINE, RANDOM  Final   Special Requests NONE  Final   Culture   Final    NO GROWTH Performed at [facility], [place] [address], [place], [place] [address]    Report Status 02/13/2021 FINAL  Final    Labs: BNP (last 3 results) No results for input(s): BNP in the last 8760 hours. Basic Metabolic Panel: Recent Labs  Lab 02/09/21 1519 02/11/21 0945 02/12/21 0430 02/13/21 0337 02/14/21 0235  NA 140 141 140 142 141  K 3.9 3.1* 4.3 3.1* 3.7  CL 105 106 106 107 108  CO2 27 23 22 29 28   GLUCOSE 113* 106* 96 119* 91  BUN 12 9 8  6* <5*  CREATININE 0.75 0.78 0.74 0.75 0.65  CALCIUM 9.0 9.2  8.4* 8.8* 8.8*  MG  --   --  2.2 2.0 1.9  PHOS  --   --   --   --  3.1   Liver Function Tests: Recent Labs  Lab 02/09/21 1519 02/11/21 0945 02/13/21 0337 02/14/21 0235  AST 29 47* 23 16  ALT 18 22 16 15   ALKPHOS 58 54 53 50  BILITOT 0.7 0.8 0.5 0.5  PROT 7.1 6.5 5.8* 5.5*  ALBUMIN 4.1 3.9 3.2* 3.0*   No results for input(s): LIPASE, AMYLASE in the last 168 hours. No results for input(s): AMMONIA in the last 168 hours. CBC: Recent Labs  Lab 02/09/21 1519 02/11/21 0945 02/13/21 0337 02/14/21 0235   WBC 6.5 4.6 4.9 5.0  NEUTROABS 5.1 3.2 2.4 2.7  HGB 12.9 13.5 12.5 11.8*  HCT 39.7 41.3 37.6 36.3  MCV 103.7* 102.7* 102.7* 103.1*  PLT 149* 157 145* 149*   Cardiac Enzymes: No results for input(s): CKTOTAL, CKMB, CKMBINDEX, TROPONINI in the last 168 hours. BNP: Invalid input(s): POCBNP CBG: No results for input(s): GLUCAP in the last 168 hours. D-Dimer No results for input(s): DDIMER in the last 72 hours. Hgb A1c No results for input(s): HGBA1C in the last 72 hours. Lipid Profile No results for input(s): CHOL, HDL, LDLCALC, TRIG, CHOLHDL, LDLDIRECT in the last 72 hours. Thyroid function studies Recent Labs    02/12/21 0929  TSH 1.963   Anemia work up Recent Labs    02/12/21 0430  VITAMINB12 208  FOLATE 7.5   Urinalysis    Component Value Date/Time   COLORURINE AMBER (A) 02/11/2021 0926   APPEARANCEUR HAZY (A) 02/11/2021 0926   LABSPEC 1.024 02/11/2021 0926   PHURINE 5.0 02/11/2021 0926   GLUCOSEU NEGATIVE 02/11/2021 0926   HGBUR NEGATIVE 02/11/2021 0926   BILIRUBINUR NEGATIVE 02/11/2021 0926   KETONESUR 20 (A) 02/11/2021 0926   PROTEINUR 30 (A) 02/11/2021 0926   UROBILINOGEN 0.2 01/17/2015 2350   NITRITE NEGATIVE 02/11/2021 0926   LEUKOCYTESUR NEGATIVE 02/11/2021 0926   Sepsis Labs Invalid input(s): PROCALCITONIN,  WBC,  LACTICIDVEN Microbiology Recent Results (from the past 240 hour(s))  SARS CORONAVIRUS 2 (TAT 6-24 HRS) Nasopharyngeal Nasopharyngeal Swab     Status: None   Collection Time: 02/11/21  5:45 PM   Specimen: Nasopharyngeal Swab  Result Value Ref Range Status   SARS Coronavirus 2 NEGATIVE NEGATIVE Final    Comment: (NOTE) SARS-CoV-2 target nucleic acids are NOT DETECTED.  The SARS-CoV-2 RNA is generally detectable in upper and lower respiratory specimens during the acute phase of infection. Negative results do not preclude SARS-CoV-2 infection, do not rule out co-infections with other pathogens, and should not be used as the sole basis  for treatment or other patient management decisions. Negative results must be combined with clinical observations, patient history, and epidemiological information. The expected result is Negative.  Fact Sheet for Patients: SugarRoll.be  Fact Sheet for Healthcare Providers: https://www.woods-mathews.com/  This test is not yet approved or cleared by the [place] FDA and  has been authorized for detection and/or diagnosis of SARS-CoV-2 by FDA under an Emergency Use Authorization (EUA). This EUA will remain  in effect (meaning this test can be used) for the duration of the COVID-19 declaration under Se ction 564(b)(1) of the Act, 21 U.S.C. section 360bbb-3(b)(1), unless the authorization is terminated or revoked sooner.  Performed at [facility], [place] [address], [place], [place] [address]   Culture, Urine     Status: None   Collection Time: 02/11/21  11:01 PM   Specimen: Urine, Random  Result Value Ref Range Status   Specimen Description URINE, RANDOM  Final   Special Requests NONE  Final   Culture   Final    NO GROWTH Performed at [facility], [address], [place], [place] [address]    Report Status 02/13/2021 FINAL  Final   Time coordinating discharge: 35 minutes  SIGNED:  Kerney Elbe, DO [facility] 02/14/2021, 11:25 AM Pager is on [place]  If 7PM-7AM, please contact night-coverage [email or web address]

## 2021-02-14 NOTE — TOC Transition Note (Signed)
Transition of Care [facility]) - CM/SW Discharge Note   Patient Details  Name: [name] MRN: [id number] Date of Birth: 1956/04/10  Transition of Care [facility] CM/SW Contact:  Geralynn Ochs, LCSW Phone Number: 02/14/2021, 12:58 PM   Clinical Narrative:   Patient set with home health PT with [facility], no other needs at this time. CSW signing off.    Final next level of care: [place] Barriers to Discharge: Barriers Resolved   Patient Goals and CMS Choice Patient states their goals for this hospitalization and ongoing recovery are:: to get back home CMS Medicare.gov Compare Post Acute Care list provided to:: Patient Choice offered to / list presented to : Patient  Discharge Placement                Patient to be transferred to facility by: Family Name of family member notified: Self Patient and family notified of of transfer: 02/14/21  Discharge Plan and Services                          HH Arranged: PT [facility] Agency: [place] Date [place]: 02/14/21   Representative spoke with at [place]: [place] ([place]) Interventions     Readmission Risk Interventions No flowsheet data found.

## 2021-02-14 NOTE — Plan of Care (Signed)

## 2021-02-15 LAB — PHENYTOIN LEVEL, FREE AND TOTAL
Phenytoin, Free: NOT DETECTED ug/mL (ref 1.0–2.0)
Phenytoin, Total: <0.8 ug/mL — ABNORMAL LOW (ref 10.0–20.0)

## 2021-04-02 ENCOUNTER — Other Ambulatory Visit: Payer: Self-pay

## 2021-04-02 MED ORDER — VITAMIN D 25 MCG (1000 UNIT) PO TABS: 1000.0000 [IU] | ORAL_TABLET | Freq: Every day | ORAL | 1 refills | Status: DC

## 2021-05-01 ENCOUNTER — Ambulatory Visit (HOSPITAL_COMMUNITY)
Admission: RE | Admit: 2021-05-01 | Discharge: 2021-05-01 | Disposition: A | Discharge status: Home / Self Care | Payer: Medicare Other | Source: Ambulatory Visit | Attending: Vascular Surgery | Admitting: Vascular Surgery

## 2021-05-01 ENCOUNTER — Other Ambulatory Visit: Payer: Self-pay

## 2021-05-01 DIAGNOSIS — I824Y9 Acute embolism and thrombosis of unspecified deep veins of unspecified proximal lower extremity: Secondary | ICD-10-CM | POA: Diagnosis not present

## 2021-05-31 ENCOUNTER — Other Ambulatory Visit: Payer: Self-pay

## 2021-05-31 ENCOUNTER — Ambulatory Visit: Payer: Medicare Other | Admitting: Neurology

## 2021-05-31 ENCOUNTER — Encounter: Payer: Self-pay | Admitting: Neurology

## 2021-05-31 VITALS — BP 116/56 | HR 75 | Ht 67.0 in | Wt 142.6 lb

## 2021-05-31 DIAGNOSIS — G629 Polyneuropathy, unspecified: Secondary | ICD-10-CM | POA: Diagnosis not present

## 2021-05-31 DIAGNOSIS — G40219 Localization-related (focal) (partial) symptomatic epilepsy and epileptic syndromes with complex partial seizures, intractable, without status epilepticus: Secondary | ICD-10-CM

## 2021-05-31 DIAGNOSIS — G243 Spasmodic torticollis: Secondary | ICD-10-CM

## 2021-05-31 DIAGNOSIS — F419 Anxiety disorder, unspecified: Secondary | ICD-10-CM | POA: Diagnosis not present

## 2021-05-31 MED ORDER — XCOPRI 100 MG PO TABS: 1.0000 | ORAL_TABLET | Freq: Every day | ORAL | 3 refills | Status: DC

## 2021-05-31 MED ORDER — LORAZEPAM 1 MG PO TABS: 1.0000 mg | ORAL_TABLET | ORAL | 5 refills | Status: DC

## 2021-05-31 MED ORDER — DILANTIN 100 MG PO CAPS: ORAL_CAPSULE | ORAL | 3 refills | Status: DC

## 2021-05-31 MED ORDER — ESCITALOPRAM OXALATE 10 MG PO TABS: 10.0000 mg | ORAL_TABLET | Freq: Every day | ORAL | 3 refills | Status: DC

## 2021-05-31 MED ORDER — DILANTIN 30 MG PO CAPS: ORAL_CAPSULE | ORAL | 3 refills | Status: DC

## 2021-05-31 MED ORDER — GABAPENTIN 300 MG PO CAPS: ORAL_CAPSULE | ORAL | 3 refills | Status: DC

## 2021-05-31 MED ORDER — LACOSAMIDE 200 MG PO TABS: 200.0000 mg | ORAL_TABLET | Freq: Two times a day (BID) | ORAL | 3 refills | Status: DC

## 2021-05-31 NOTE — Progress Notes (Signed)
NEUROLOGY FOLLOW UP OFFICE NOTE  [name] [id number] [birth year]  HISTORY OF PRESENT ILLNESS: I had the pleasure of seeing [name] in follow-up in the neurology clinic on 05/31/2021.  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 She is again accompanied by her daughter [name] who helps supplement the history today.  Records and images were personally reviewed where available.  Since her last visit, she was in the ER for an increase in seizures last 02/2021. She had 10-12 seizures in a 3-4 day period and her mental state was off. Her prn lorazepam was not helping. She was treated for a UTI. Her Dilantin level was 8.4, her dose of Dilantin was again increased to 100mg  in AM, 230mg  in PM and this made her very ataxic. She could not walk, she reduced back to 100mg  in AM, 130mg  in PM and has been doing well since then. She is also on Lacosamide 200mg  BID and Cenobamate 100mg  daily. Her last seizure was a month ago. She is on Lexapro 10mg  daily for mood. She is on gabapentin 300mg  qhs neuropathy and reports neuropathy is about the same. Sleep is good. Her main concern is the head tremor, "I can't stand the head bobbing." She was previously receiving Botox for cervical dystonia.    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 She had been seeing epileptologist [name], records were reviewed and will be summarized as follows. In the past, seizures would start with headache and burning sensation of the left arm, which may be followed by left arm jerking. Over time, she has not had any prior warning symptoms. She has had seizures where she is noted to have kicking and rocking movements of the pelvis with unresponsiveness. She was in the EMU in 10/2012  where 11 seizures were captured from the right posterior hemisphere/right inferior temporal region. Interictal EEG showed abundant right posterior temporal epileptiform discharges, frequent right anterior temporal epileptiform discharges, as well as occasional independent left anterior temporal epileptiform discharges. There was focal slowing over the right posterior hemisphere. MRI brain in 2014 showed right parietal/occipital encephalomalacia, right hippocampus smaller than left. PET scan in 2015 showed multiple areas of hypometabolism on the right, mostly corresponding to or adjacent to the structural abnormality. Ictal SPECT showed right hemisphere hypoperfusion. SISCOM showed right frontal region significant on the subtraction. MEG in 07/2013 showed spikes localizing to the right occipital, parietal, mesial temporal regions with a weak signal left mesial parietal. Concomitant EEG showed right posterior spikes. Functional mapping suggestive of left language lateralization. She has tried multiple AEDs in the past with various side effects or ineffective, and has been taking Dilantin 160mg  in AM, 200mg  in PM and Vimpat 200mg  BID with seizures around once a month. She was [facility] to [facility] on [facility]18 for left leg weakness and drowsiness. She was fishing with her boyfriend when she suddenly could not move her left leg and almost fell. She states that her boyfriend told her that she was shaking and had a seizure while they were fishing. She was brought to [facility] where Dilantin level was 33.8. She was adamant that she has been taking her medications as instructed and did not take more than prescribed, no new medications taken. She had an MRI brain without contrast which I personally  reviewed, no acute changes seen. Large volume right parieto-occipital encephalomalacia was unchanged. Right hippocampus is smaller. She continued to report weakness and had an MRI cervical and lumbar spine which did not show any myelopathy,  there was prominent left-sided facet hypertrophy at C4-5 with resultant moderate left C5 foraminal stenosis, degenerative disc osteophyte at C5-6 with severe right C6 foraminal stenosis, and mild spinal stenosis at C5-6 and C6-7, chronic borderline to mild multifactorial spinal stenosis at L2-3 through L4-5 with no convincing lateral recess or foraminal stenosis. She continued to report burning pain in her legs and was started on low dose gabapentin 300mg  qhs with no side effects. Dilantin was held in the hospital, she was discharged on a lower dose, but states that she has been taking her pre-hospital dose of 160mg  in AM, 200mg  in PM but has had 4 seizures since hospital discharge 2 weeks ago, which is unusual. She had 2 seizures in one day in the car with her mother-in-law, her mother-in-law had to pull over. The next day she had 2 at home, her daughter found her in the bathroom. She has not been sleeping well the past few days. She has a headache after the seizures. Aside from the convulsions, she has episodes where a feeling comes over her left side, from the arm down her leg, then she is weaker after. She denies any jerking. She is quite anxious in the office today and perseverates on her symptoms. She states she "stays nervous." She reports taking prn Ativan in the past, but discontinued at [facility]. She repeatedly states her feet are numb and they "feel like they are grabbed." She feels gabapentin has helped some with this.    Prior AEDs: phenobarbital (ineffective, mood issues), Depakote (multiple trials, ineffective), Keppra (ineffective), Zonisamide (side effects on brief trial of higher dose than prescribed - 500mg  instead of 200mg ), Onfi (mouth sores), Tegretol (ineffective), Lamictal (nausea, dizziness, malaise), Felbatol (nausea, dizziness, malaise), Topamax (ineffective), Tigabine (tried per notes, details unclear), Neurontin (ineffective, nausea), Lyrica (leg pain reported on 25mg  daily, tingling),  Briviact (drowsiness), Fycompa   Epilepsy Risk Factors:  Right parieto-occipital AVM s/p rupture and hemorrhage in 1988, s/p craniectomy. Otherwise she had a normal birth and early development.  There is no history of febrile convulsions, CNS infections such as meningitis/encephalitis, or family history of seizures.  Diagnostic Data:  MRI brain without contrast 05/2018 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  EEG done 06/2018 showed right posterior temporal slowing, occasional epileptiform discharges over the right posterior temporal region, breach artifact in this region.   MRI cervical, thoracic, and lumbar spine done 07/2018 showed no convincing cervical or thoracic spinal cord abnormality. Mild degenerative cervical spinal stenosis at C5-6 and C6-7 with moderate or severe bilateral foraminal stenosis; no thoracic spinal or foraminal stenosis; stable borderline to mild lumbar spinal stenosis  MRI lumbar spine in 2019 showed borderline to mild multifactorial spinal stenosis at L2-L3 and L3-L4 related to circumferential disc bulge and posterior element Hypertrophy.  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    PAST MEDICAL HISTORY: Past Medical History:  Diagnosis Date   Asthma    Cerebral hemorrhage ([place]) 1989   Chronic back pain    COPD (chronic obstructive pulmonary disease) ([place])    DDD (degenerative disc disease) 06/12/2013   DVT (deep venous thrombosis) ([place])    "she's had several since 1990"  GERD (gastroesophageal reflux disease)    Headache    "usually around time when she's had a seizure"   History of blood transfusion    "when she was a baby"   History of stomach ulcers    Kidney stones    Neuropathy    Seizures ([place])    "because of her brain surgery"   Stroke [facility]) 1990's   family denies  residual on 11/09/2014   Tunnel vision    "since brain OR"    MEDICATIONS: Current Outpatient Medications on File Prior to Visit  Medication Sig Dispense Refill   acetaminophen (TYLENOL) 500 MG tablet Take 1,000 mg by mouth every 6 (six) hours as needed for headache.     Cenobamate (XCOPRI) 100 MG TABS Take 1 tablet by mouth daily. 90 tablet 3   cholecalciferol (VITAMIN D3) 25 MCG (1000 UNIT) tablet Take 1 tablet (1,000 Units total) by mouth daily. 90 tablet 1   [facility] B-12 500 MCG tablet TAKE 2 TABLETS BY MOUTH EVERY DAY 100 tablet 0   DILANTIN 100 MG ER capsule Take 1 capsule twice a day 180 capsule 3   DILANTIN 30 MG ER capsule Take 1 capsule every night (take with 100mg  capsule for total of 130mg  every night) 90 capsule 3   escitalopram (LEXAPRO) 10 MG tablet Take 1 tablet (10 mg total) by mouth at bedtime. 90 tablet 3   folic acid (FOLVITE) 1 MG tablet Take 1 tablet (1 mg total) by mouth daily. 30 tablet 0   gabapentin (NEURONTIN) 300 MG capsule Take 1 capsule every night 90 capsule 3   lacosamide (VIMPAT) 200 MG TABS tablet Take 1 tablet (200 mg total) by mouth 2 (two) times daily. 180 tablet 3   LORazepam (ATIVAN) 1 MG tablet Take 1 tablet (1 mg total) by mouth See admin instructions. Take 1 mg by mouth as directed/as needed for 3 or more seizures/24 hours-  Do not take more than 2 mg in 24 hours 10 tablet 5   No current facility-administered medications on file prior to visit.    ALLERGIES: Allergies  Allergen Reactions   Zonisamide Other (See Comments)    Numbness and tingling over whole body   Chocolate Flavor Other (See Comments)    Trigger for seizures   Codeine Nausea And Vomiting   Keppra [Levetiracetam] Other (See Comments)    Causes seizures   Pregabalin Nausea And Vomiting   Olive Oil Rash    FAMILY HISTORY: Family History  Problem Relation Age of Onset   Heart attack Mother        4 MIs, started in her 1s, also vaginal cancer and colon cancer   Cancer Mother     Heart attack Father        Died suddenly age 64 - autopsy revealed heart attack her patient   Cancer Brother        Sinus cancer    SOCIAL HISTORY: Social History   Socioeconomic History   Marital status: Divorced    Spouse name: Not on file   Number of children: 3   Years of education: Not on file   Highest education level: Not on file  Occupational History   Not on file  Tobacco Use   Smoking status: Former    Packs/day: 0.50    Years: 20.00    Pack years: 10.00    Types: Cigarettes    Quit date: 10/26/2017    Years since quitting: 3.5   Smokeless tobacco: Never  Tobacco comments:    Previously smoked 2 ppd, down to 4 cigs/day  Vaping Use   Vaping Use: Never used  Substance and Sexual Activity   Alcohol use: No   Drug use: No   Sexual activity: Never    Birth control/protection: Post-menopausal  Other Topics Concern   Not on file  Social History Narrative   Pt lives in 2 story home with her daughter, daughter's family and pt's mother-in-law   Has 2 living children / 1 deceased   10-30-22 grade education   Worked for [employer] until 1998 when she became disabled.    Right handed   Social Determinants of Health   Financial Resource Strain: Not on file  Food Insecurity: Not on file  Transportation Needs: Not on file  Physical Activity: Not on file  Stress: Not on file  Social Connections: Not on file  Intimate Partner Violence: Not on file     PHYSICAL EXAM: Vitals:   05/31/21 1025  BP: (!) 116/56  Pulse: 75  SpO2: 95%   General: No acute distress Head:  Normocephalic/atraumatic. Torticollis again noted with head turned to the right with side to side head tremor Skin/Extremities: No rash, no edema Neurological Exam: alert and awake. No aphasia or dysarthria. Fund of knowledge is appropriate.  Attention and concentration are normal.   Cranial nerves: Pupils equal, round. Extraocular movements intact with no nystagmus. Visual fields full.  No facial  asymmetry.  Motor: Bulk and tone normal, muscle strength 5/5 throughout with no pronator drift, reporting her left leg bothers her sometimes.  Finger to nose testing intact.  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 She has tried multiple AEDs in the past with various side effects. She has had several admissions for Dilantin toxicity and cannot tolerate higher doses of Dilantin. We again discussed that moving forward, there is no need to increase dose even if she has subtherapeutic levels. Adjustments may be made if level is supratherapeutic and she is symptomatic, otherwise we will continue on Dilantin 100mg  in AM, 130mg  in PM. Continue Lacosamide 200mg  BID and Cenobamate 100mg  daily. She is on Gabapentin 300mg  qhs for neuropathy and Lexapro 10mg  daily for mood. Refills for medications sent. She reports worsening head tremor from cervical dystonia, we will look into approval for Botox. She does not drive. Follow-up in 4-5 months, they know to call for any changes.    Thank you for allowing me to participate in her care.  Please do not hesitate to call for any questions or concerns.    Ellouise Newer, M.D.   CC: Dr. Maudie Mercury

## 2021-05-31 NOTE — Patient Instructions (Signed)
Good to see you!  Refills have been sent for your medications. We will check on insurance for the Botox. Follow-up in 4-5 months, call for any changes   Seizure Precautions: 1. If medication has been prescribed for you to prevent seizures, take it exactly as directed.  Do not stop taking the medicine without talking to your doctor first, even if you have not had a seizure in a long time.   2. Avoid activities in which a seizure would cause danger to yourself or to others.  Don't operate dangerous machinery, swim alone, or climb in high or dangerous places, such as on ladders, roofs, or girders.  Do not drive unless your doctor says you may.  3. If you have any warning that you may have a seizure, lay down in a safe place where you can't hurt yourself.    4.  No driving for 6 months from last seizure, as per [facility].   Please refer to the following link on the [place] website for more information: [email or web address]   5.  Maintain good sleep hygiene.  6.  Contact your doctor if you have any problems that may be related to the medicine you are taking.  7.  Call 911 and bring the patient back to the ED if:        A.  The seizure lasts longer than 5 minutes.       B.  The patient doesn't awaken shortly after the seizure  C.  The patient has new problems such as difficulty seeing, speaking or moving  D.  The patient was injured during the seizure  E.  The patient has a temperature over 102 F (39C)  F.  The patient vomited and now is having trouble breathing

## 2021-06-06 ENCOUNTER — Ambulatory Visit: Payer: Medicare Other | Admitting: Vascular Surgery

## 2021-06-11 ENCOUNTER — Telehealth: Payer: Self-pay

## 2021-06-11 NOTE — Telephone Encounter (Signed)
Spoke with pt daughter informed her that unfortunately insurance will not cover botox. Pt daughter stated that the pt is getting new insurance and once they get the card they will get it to [place],

## 2021-06-11 NOTE — Telephone Encounter (Signed)
-----   Message from [name], MD sent at 06/11/2021 10:39 AM EST ----- Regarding: Botox not covered Can you pls let her daughter know that unfortunately her insurance would not cover Botox, sorry. Thanks

## 2021-07-04 ENCOUNTER — Other Ambulatory Visit: Payer: Self-pay

## 2021-07-04 ENCOUNTER — Encounter: Payer: Self-pay | Admitting: Vascular Surgery

## 2021-07-04 ENCOUNTER — Ambulatory Visit (INDEPENDENT_AMBULATORY_CARE_PROVIDER_SITE_OTHER): Payer: Medicare Other | Admitting: Vascular Surgery

## 2021-07-04 VITALS — BP 106/61 | HR 80 | Temp 98.3°F | Resp 20 | Ht 67.0 in | Wt 146.0 lb

## 2021-07-04 DIAGNOSIS — Z95828 Presence of other vascular implants and grafts: Secondary | ICD-10-CM | POA: Diagnosis not present

## 2021-07-04 NOTE — Progress Notes (Signed)
REASON FOR VISIT:   Follow-up of IVC filter.  MEDICAL ISSUES:   H/O IVC FILTER AND HISTORY OF LEFT LOWER EXTREMITY DVT: This patient had an extensive left lower extremity DVT with a contraindication to heparin given her previous history of an intracranial hemorrhage.  She had an IVC filter placed on 10/12/2020 and our plan was not to remove the filter.  She comes in for routine follow-up visit and is doing well.  Her ultrasound looks good.  I have arranged a follow-up abdominal x-ray of the abdomen in 1 year and also an ultrasound of her IVC and iliac veins in 1 year.  I will see her back at that time.  She knows to call sooner if she has problems.  PARESTHESIAS BOTH FEET: She does complain of some paresthesias in her feet but does have a history of neuropathy and also lumbar disc disease.  She has palpable pedal pulses so I reassured her that I do not think her symptoms are related to peripheral arterial disease.    HPI:   [name] is a pleasant 65 y.o. female who had a left lower extremity DVT in March of this year.  She had a contraindication to heparin because of frequent falls.  For this reason an IVC filter was recommended.  On 10/12/2020 she underwent placement of a Cook Celect IVC filter via a right femoral approach.  At the time the plan was to not retrieve the filter.  Given the long-term risk for anticoagulation.  She has a history of an intracranial aneurysm which was the contraindication to anticoagulation.  Since I saw her last her only complaint is some paresthesias in both feet.  She also has a history of some degenerative disc disease of her back.  I do not get any history of claudication or rest pain.  She is had no abdominal pain.  Past Medical History:  Diagnosis Date   Asthma    Cerebral hemorrhage ([place]) 1989   Chronic back pain    COPD (chronic obstructive pulmonary disease) ([facility])    DDD (degenerative disc disease) 06/12/2013   DVT (deep venous thrombosis)  ([facility])    "she's had several since 1990"   GERD (gastroesophageal reflux disease)    Headache    "usually around time when she's had a seizure"   History of blood transfusion    "when she was a baby"   History of stomach ulcers    Kidney stones    Neuropathy    Seizures ([place])    "because of her brain surgery"   Stroke [facility]) 1990's   family denies residual on 11/09/2014   Tunnel vision    "since brain OR"    Family History  Problem Relation Age of Onset   Heart attack Mother        4 MIs, started in her 68s, also vaginal cancer and colon cancer   Cancer Mother    Heart attack Father        Died suddenly age 79 - autopsy revealed heart attack her patient   Cancer Brother        Sinus cancer    SOCIAL HISTORY: Social History   Tobacco Use   Smoking status: Former    Packs/day: 0.50    Years: 20.00    Pack years: 10.00    Types: Cigarettes    Quit date: 10/26/2017    Years since quitting: 3.6   Smokeless tobacco: Never   Tobacco comments:  Previously smoked 2 ppd, down to 4 cigs/day  Substance Use Topics   Alcohol use: No    Allergies  Allergen Reactions   Zonisamide Other (See Comments)    Numbness and tingling over whole body   Chocolate Flavor Other (See Comments)    Trigger for seizures   Codeine Nausea And Vomiting   Keppra [Levetiracetam] Other (See Comments)    Causes seizures   Pregabalin Nausea And Vomiting   Olive Oil Rash    Current Outpatient Medications  Medication Sig Dispense Refill   acetaminophen (TYLENOL) 500 MG tablet Take 1,000 mg by mouth every 6 (six) hours as needed for headache.     Cenobamate (XCOPRI) 100 MG TABS Take 1 tablet (100 mg total) by mouth daily. 90 tablet 3   cholecalciferol (VITAMIN D3) 25 MCG (1000 UNIT) tablet Take 1 tablet (1,000 Units total) by mouth daily. 90 tablet 1   CVS B-12 500 MCG tablet TAKE 2 TABLETS BY MOUTH EVERY DAY 100 tablet 0   DILANTIN 100 MG ER capsule Take 1 capsule twice a day 180 capsule 3    DILANTIN 30 MG ER capsule Take 1 capsule every night (take with 100mg  capsule for total of 130mg  every night) 90 capsule 3   escitalopram (LEXAPRO) 10 MG tablet Take 1 tablet (10 mg total) by mouth at bedtime. 90 tablet 3   folic acid (FOLVITE) 1 MG tablet Take 1 tablet (1 mg total) by mouth daily. 30 tablet 0   gabapentin (NEURONTIN) 300 MG capsule Take 1 capsule every night 90 capsule 3   lacosamide (VIMPAT) 200 MG TABS tablet Take 1 tablet (200 mg total) by mouth 2 (two) times daily. 180 tablet 3   LORazepam (ATIVAN) 1 MG tablet Take 1 tablet (1 mg total) by mouth See admin instructions. Take 1 mg by mouth as directed/as needed for 3 or more seizures/24 hours-  Do not take more than 2 mg in 24 hours 10 tablet 5   No current facility-administered medications for this visit.    REVIEW OF SYSTEMS:  [X]  denotes positive finding, [ ]  denotes negative finding Cardiac  Comments:  Chest pain or chest pressure:    Shortness of breath upon exertion:    Short of breath when lying flat:    Irregular heart rhythm:        Vascular    Pain in calf, thigh, or hip brought on by ambulation: x left  Pain in feet at night that wakes you up from your sleep:     Blood clot in your veins:    Leg swelling:         Pulmonary    Oxygen at home:    Productive cough:     Wheezing:         Neurologic    Sudden weakness in arms or legs:     Sudden numbness in arms or legs:     Sudden onset of difficulty speaking or slurred speech:    Temporary loss of vision in one eye:     Problems with dizziness:         Gastrointestinal    Blood in stool:     Vomited blood:         Genitourinary    Burning when urinating:     Blood in urine:        Psychiatric    Major depression:         Hematologic    Bleeding problems:  Problems with blood clotting too easily:        Skin    Rashes or ulcers:        Constitutional    Fever or chills:     PHYSICAL EXAM:   Vitals:   07/04/21 0838  BP: 106/61   Pulse: 80  Resp: 20  Temp: 98.3 F (36.8 C)  SpO2: 96%  Weight: 146 lb (66.2 kg)  Height: 5\' 7"  (1.702 m)    GENERAL: The patient is a well-nourished female, in no acute distress. The vital signs are documented above. CARDIAC: There is a regular rate and rhythm.  VASCULAR: I do not detect carotid bruits. She has palpable pedal pulses. She has no significant lower extremity swelling. PULMONARY: There is good air exchange bilaterally without wheezing or rales. ABDOMEN: Soft and non-tender with normal pitched bowel sounds.  MUSCULOSKELETAL: There are no major deformities or cyanosis. NEUROLOGIC: No focal weakness or paresthesias are detected. SKIN: There are no ulcers or rashes noted. PSYCHIATRIC: The patient has a normal affect.  DATA:    DUPLEX IVC ON 05/01/2021: I reviewed the duplex that was done on 05/01/2021.  There was no evidence of thrombus involving the IVC.  They looked at the left iliac system only.  There was no clot in the common iliac vein.  There appeared to be some old chronic clot in the left external iliac vein.  Deitra Mayo [facility] [phone]

## 2021-09-09 ENCOUNTER — Other Ambulatory Visit: Payer: Self-pay | Admitting: Neurology

## 2021-10-05 IMAGING — MR MR HEAD W/O CM
11 of 13 series · 39 of 48 positions shown · non-contrast
Comparison: Head CT 08/26/2019 and MRI 05/31/2018

CLINICAL DATA: Seizures. Ataxia.

EXAM:
MRI HEAD WITHOUT CONTRAST
TECHNIQUE: Multiplanar, multiecho pulse sequences of the brain and surrounding
structures were obtained without intravenous contrast.

[Series 5: DWI · axial · 3.0mm · 0.88mm/px · z∈[-93,+46]mm · 9 of 95 slices shown (1 of 4)]
[im 1/95]
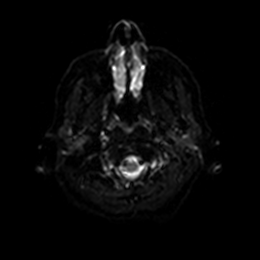
[im 12/95]
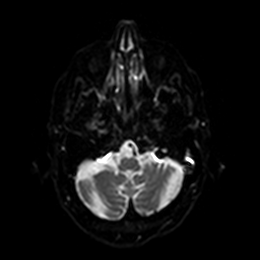
[im 24/95]
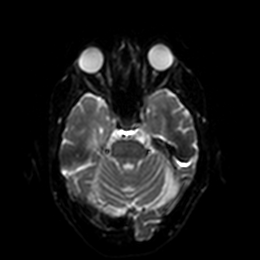
[im 36/95]
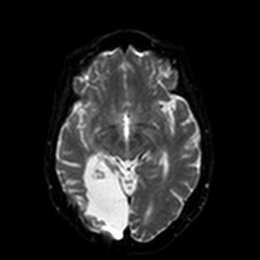
[im 48/95]
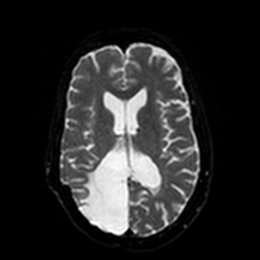
[im 59/95]
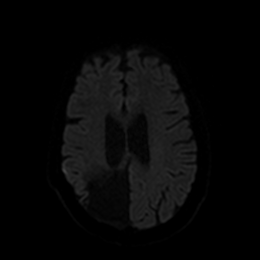
[im 71/95]
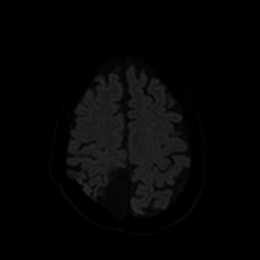
[im 83/95]
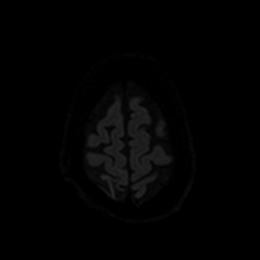
[im 95/95]
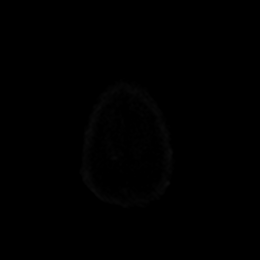

[Series 6: DWI · axial · 3.0mm · 0.88mm/px · z∈[-93,+46]mm · 5 of 48 slices shown (2 of 4)]
[im 1/48]
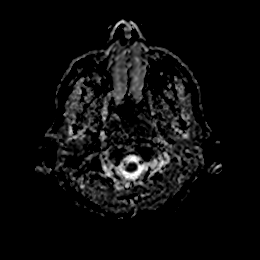
[im 12/48]
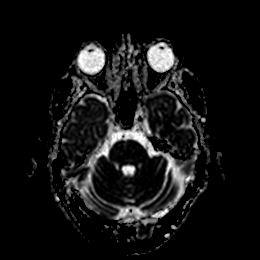
[im 24/48]
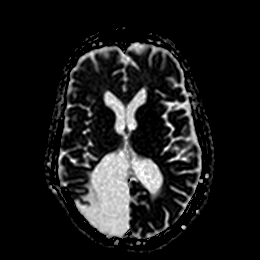
[im 36/48]
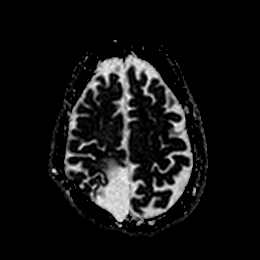
[im 48/48]
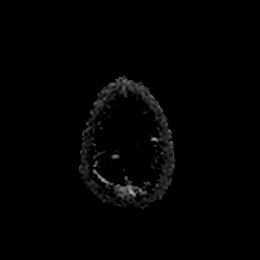

[Series 7: DWI · coronal · 4.0mm · 0.88mm/px · 5 of 64 slices shown (3 of 4)]
[im 1/64]
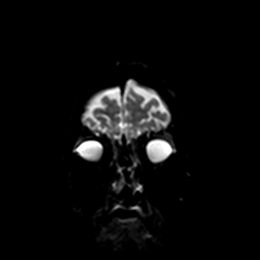
[im 16/64]
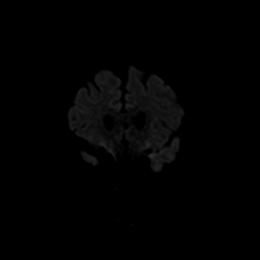
[im 32/64]
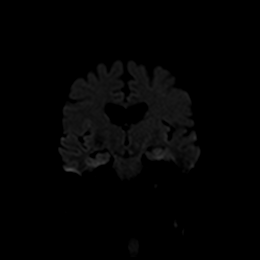
[im 48/64]
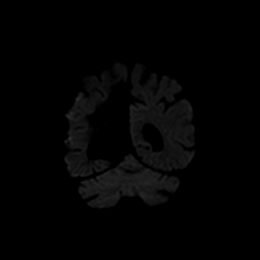
[im 64/64]
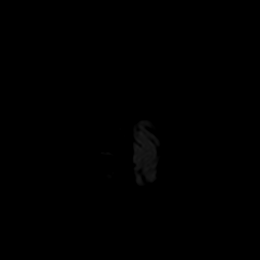

[Series 8: DWI · coronal · 4.0mm · 0.88mm/px · 3 of 32 slices shown (4 of 4)]
[im 1/32]
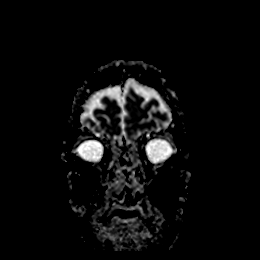
[im 16/32]
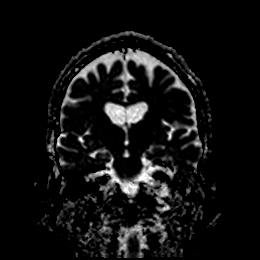
[im 32/32]
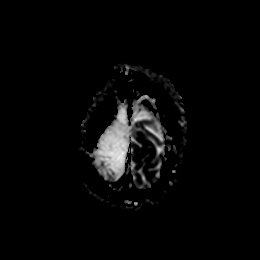

[Series 9: T1 · sagittal · 5.0mm · 0.75mm/px · 2 of 23 slices shown]
[im 1/23]
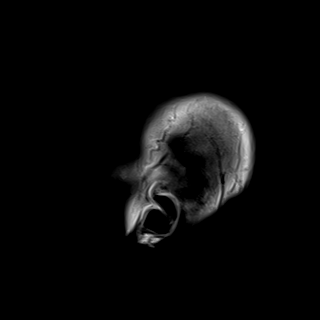
[im 23/23]
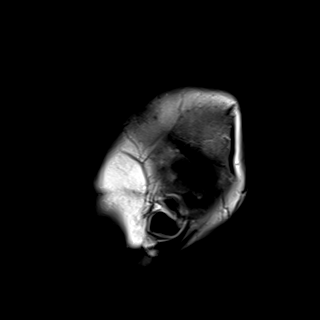

[Series 10: T2 · axial · 5.0mm · 0.72mm/px · z∈[-92,+51]mm · 2 of 25 slices shown (1 of 3)]
[im 1/25]
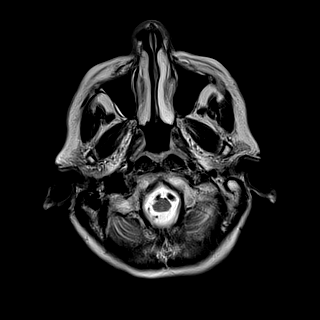
[im 25/25]
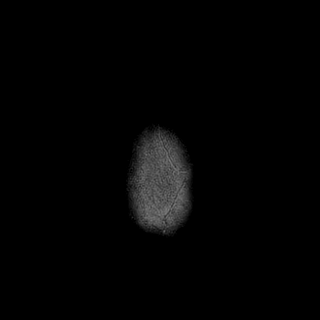

[Series 11: FLAIR · axial · 5.0mm · 0.45mm/px · z∈[-92,+51]mm · 2 of 25 slices shown (1 of 2)]
[im 1/25]
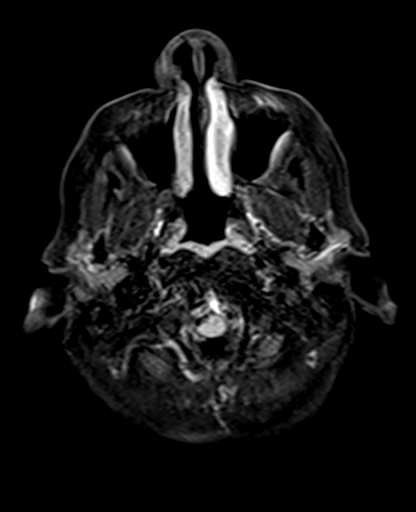
[im 25/25]
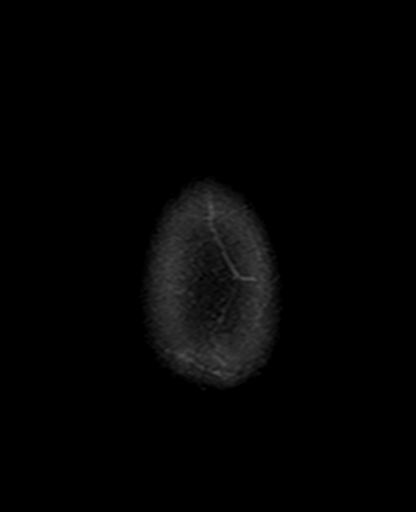

[Series 13: pha_images · axial · 3.0mm · 0.90mm/px · z∈[-99,-1]mm · 3 of 51 slices shown]
[im 1/51]
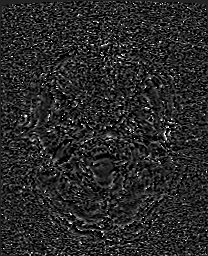
[im 17/51]
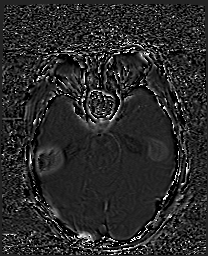
[im 34/51]
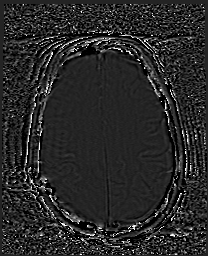

[Series 17: T2 · coronal · 3.0mm · 0.27mm/px · 3 of 32 slices shown (2 of 3)]
[im 1/32]
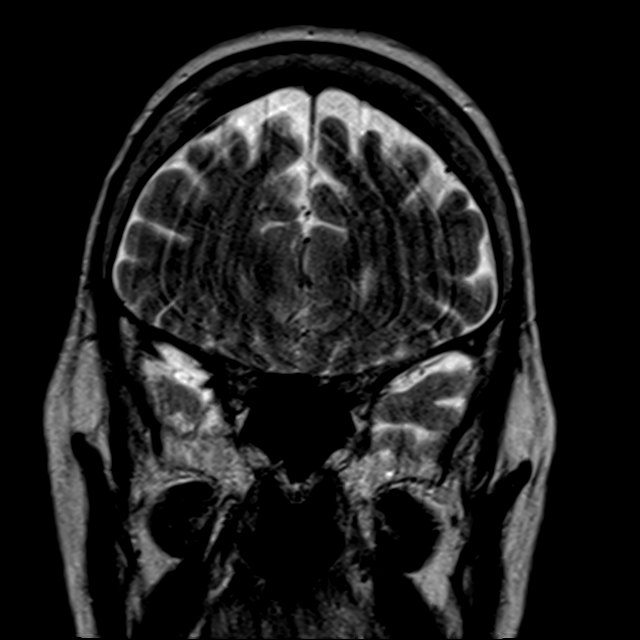
[im 16/32]
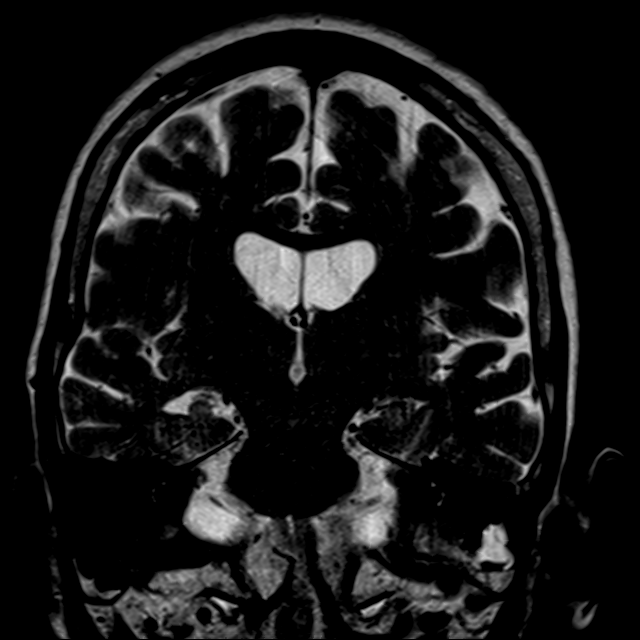
[im 32/32]
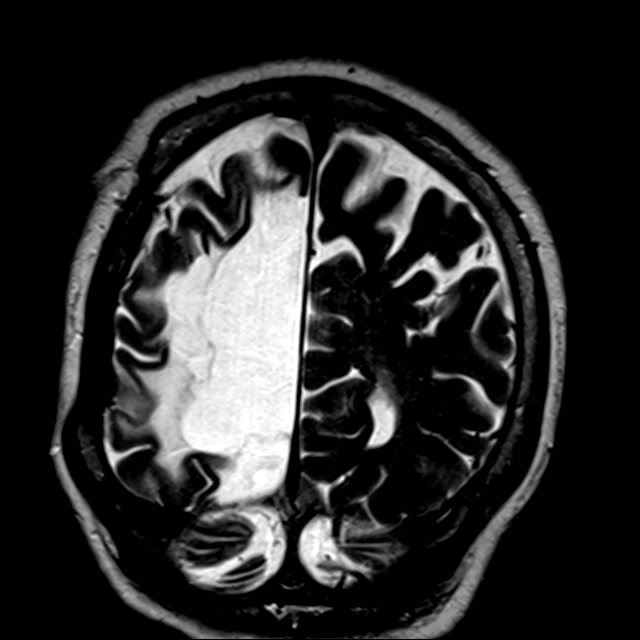

[Series 18: FLAIR · coronal · 3.0mm · 0.56mm/px · 3 of 32 slices shown (2 of 2)]
[im 1/32]
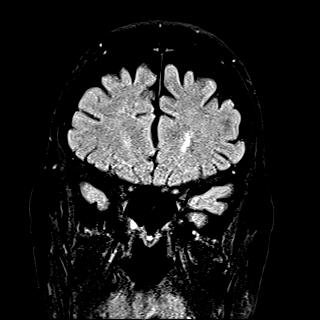
[im 16/32]
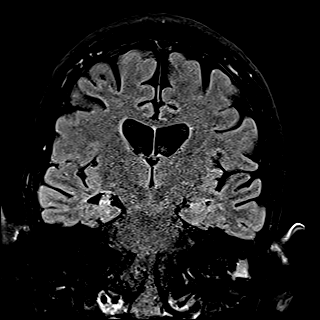
[im 32/32]
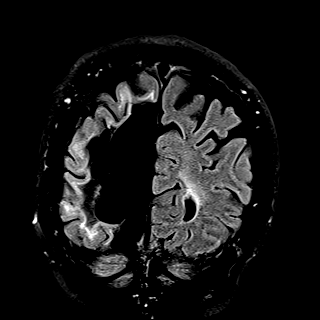

[Series 19: T2 · coronal · 5.0mm · 0.34mm/px · 2 of 29 slices shown (3 of 3)]
[im 1/29]
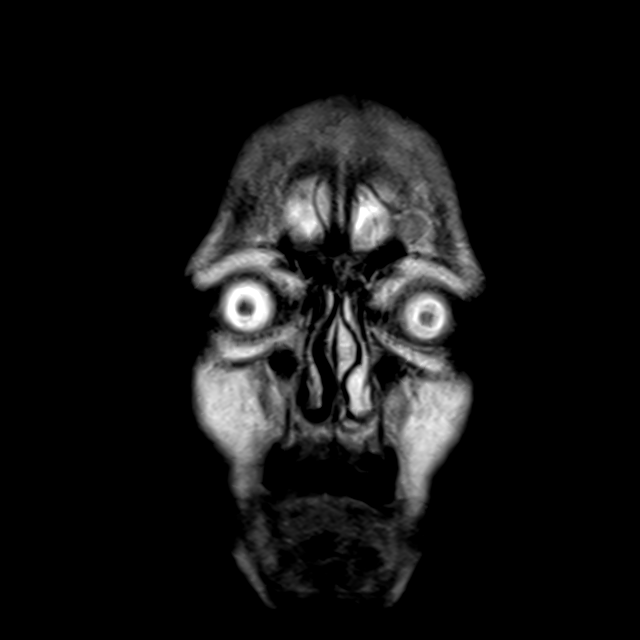
[im 29/29]
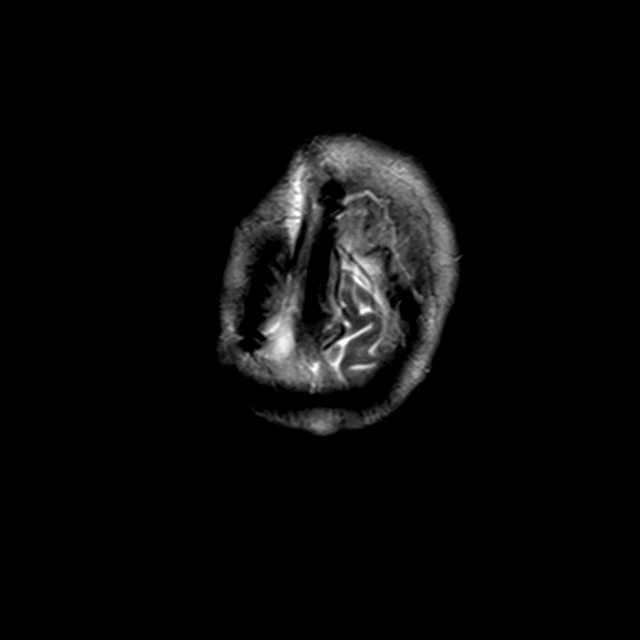

[39 of 48 positions shown; findings below may reference images not displayed]

FINDINGS: The study is mildly motion degraded.

Brain: There is no evidence of acute infarct, intracranial
hemorrhage, mass, midline shift, or extra-axial fluid collection. A
large area of right parieto-occipital encephalomalacia is again
noted communicating with a dilated right lateral ventricle,
unchanged from the prior MRI. Minimal T2 FLAIR hyperintensity in the
white matter adjacent to the left lateral ventricle is unchanged and
nonspecific but compatible with chronic small vessel ischemia. There
is chronic asymmetric right hippocampal volume loss.

Vascular: Major intracranial vascular flow voids are preserved.

Skull and upper cervical spine: Right parieto-occipital craniotomy.
No suspicious marrow lesion.

Sinuses/Orbits: Unremarkable orbits. Clear paranasal sinuses.
Chronic left mastoid effusion.

Other: None.
IMPRESSION: 1. No acute intracranial abnormality.
2. Unchanged right parieto-occipital encephalomalacia.

## 2021-10-05 IMAGING — DX DG CHEST 1V PORT
1 series · 1 of 1 positions shown · non-contrast
Comparison: CT angiogram chest August 26, 2019; chest radiograph
May 29, 2018

CLINICAL DATA: Shortness of breath

EXAM:
PORTABLE CHEST 1 VIEW

[chest ap]
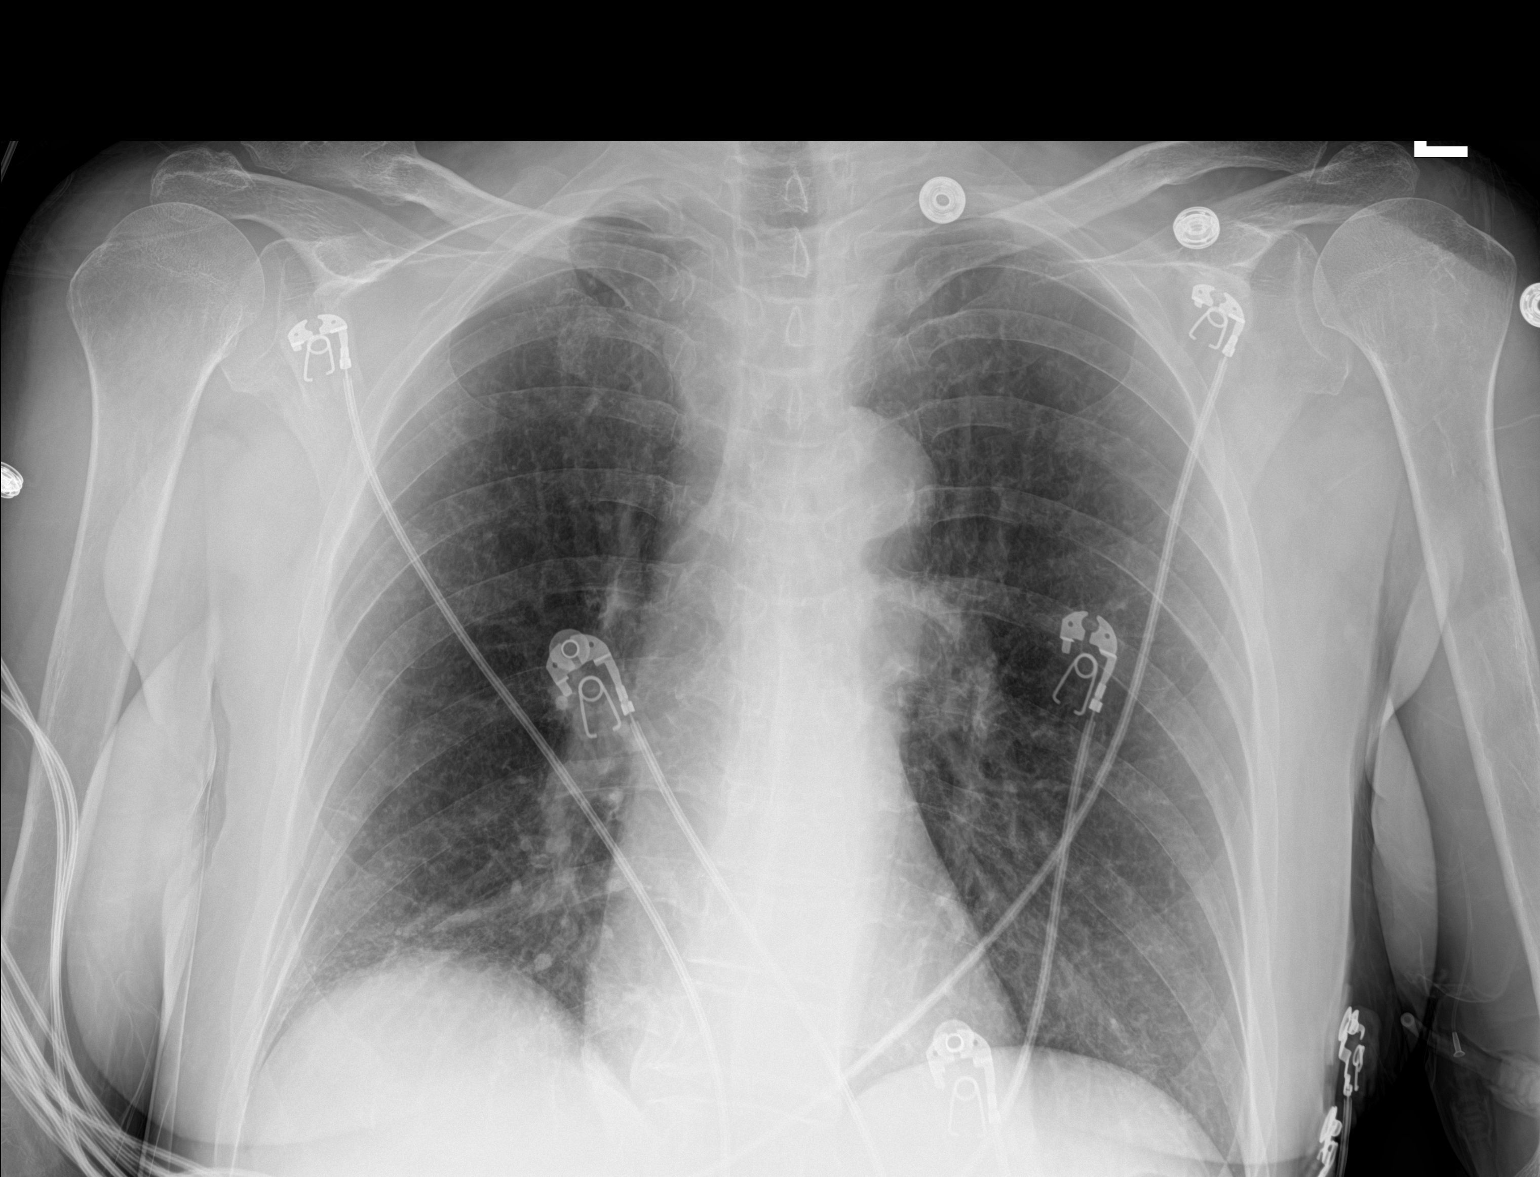

[1 of 1 positions shown; findings below may reference images not displayed]

FINDINGS: There is mild bibasilar atelectasis. No edema or consolidation.
Heart size and pulmonary vascularity are normal. No adenopathy.
There is aortic atherosclerosis. No bone lesions.
IMPRESSION: Bibasilar atelectasis. Lungs elsewhere clear. Cardiac silhouette
normal. No adenopathy. Aortic Atherosclerosis ([id number].T).

## 2021-10-05 IMAGING — CT CT ANGIO CHEST
2 of 6 series · 19 of 36 positions shown · IV contrast (omnipaque)
Comparison: 05/23/2018

CLINICAL DATA: Shortness of breath. Seizures.

EXAM:
CT ANGIOGRAPHY CHEST WITH CONTRAST
TECHNIQUE: Multidetector CT imaging of the chest was performed using the
standard protocol during bolus administration of intravenous
contrast. Multiplanar CT image reconstructions and MIPs were
obtained to evaluate the vascular anatomy.
CONTRAST:  70mL OMNIPAQUE IOHEXOL 350 MG/ML SOLN

[Series 7: pe thins · axial · 0.70mm/px · z∈[+1095,+1375]mm · 18 of 447 slices shown]
[im 23/447  lung]
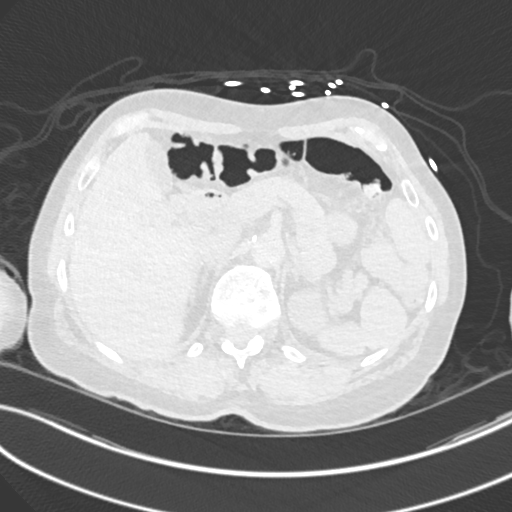
[im 45/447  mediastinal]
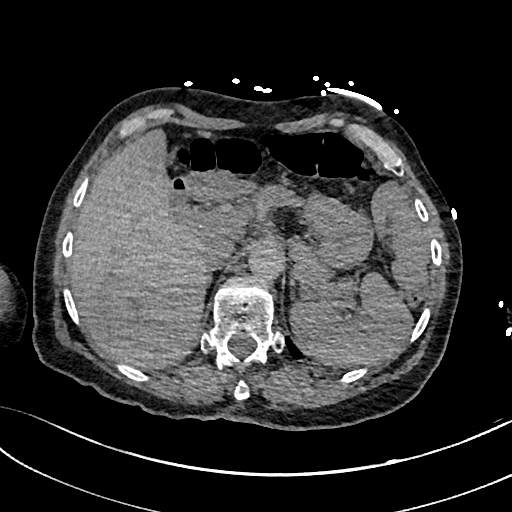
[im 67/447  lung]
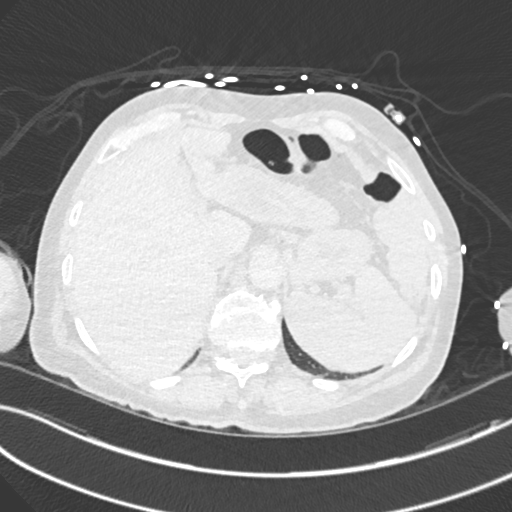
[im 90/447  mediastinal]
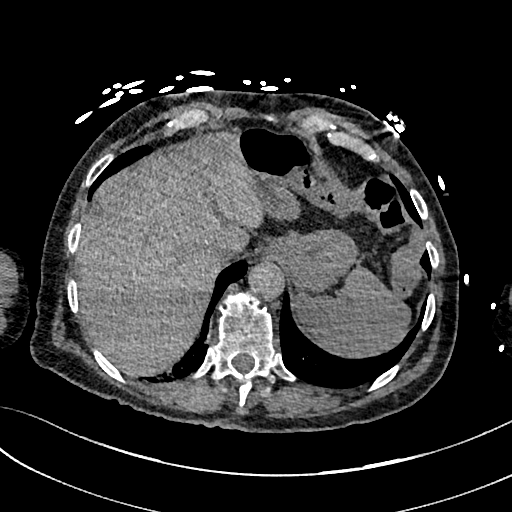
[im 112/447  lung]
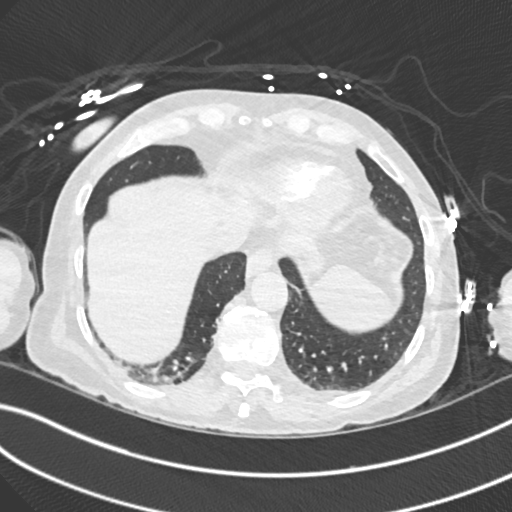
[im 134/447  mediastinal]
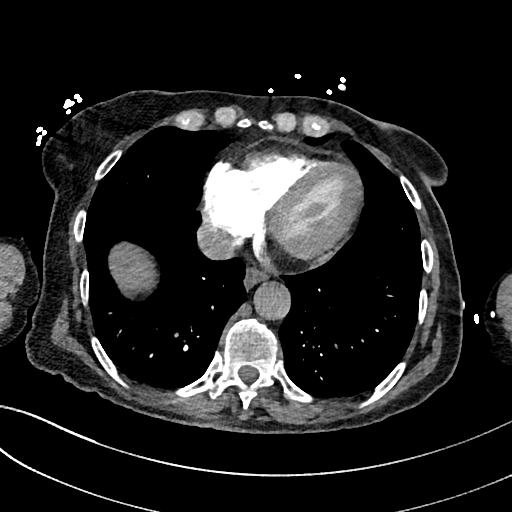
[im 157/447  lung]
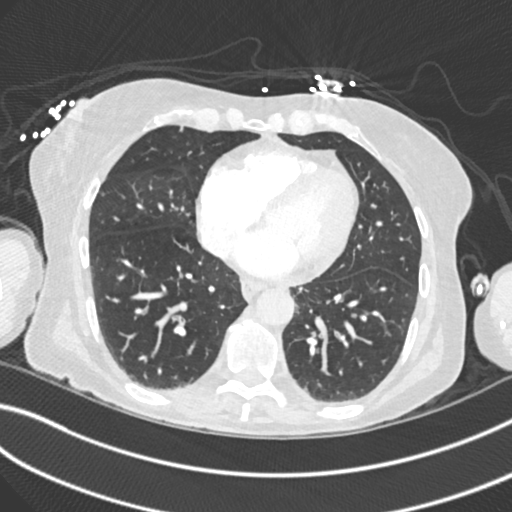
[im 179/447  mediastinal]
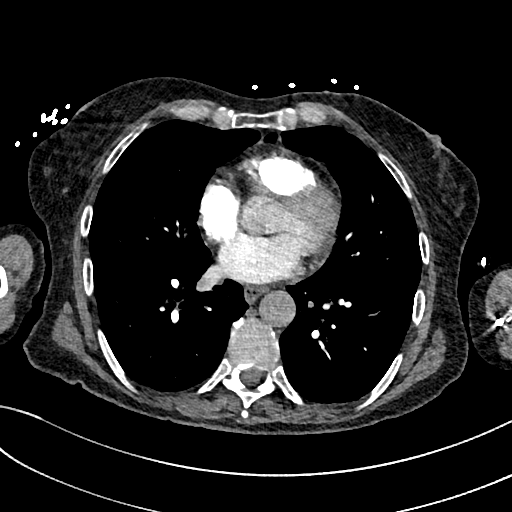
[im 201/447  lung]
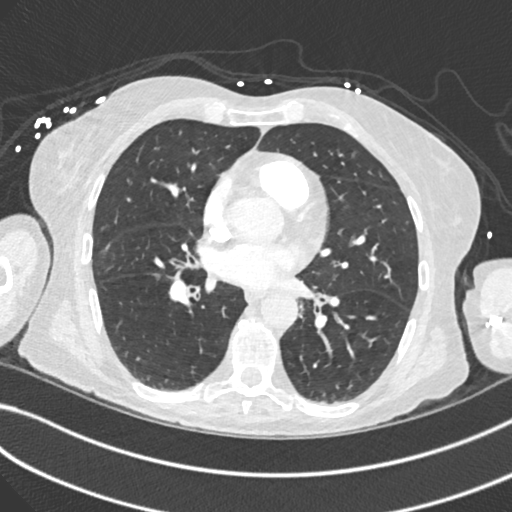
[im 246/447  mediastinal]
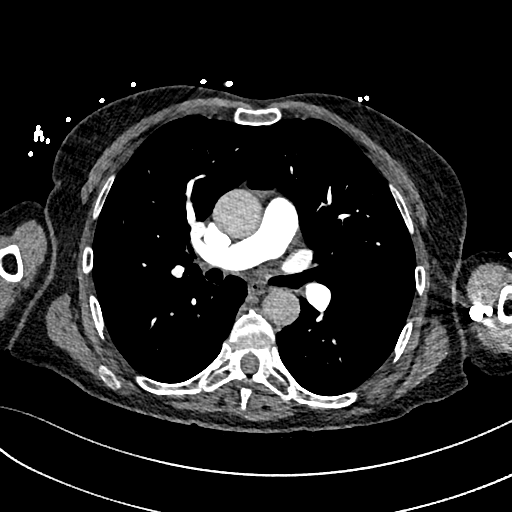
[im 268/447  lung]
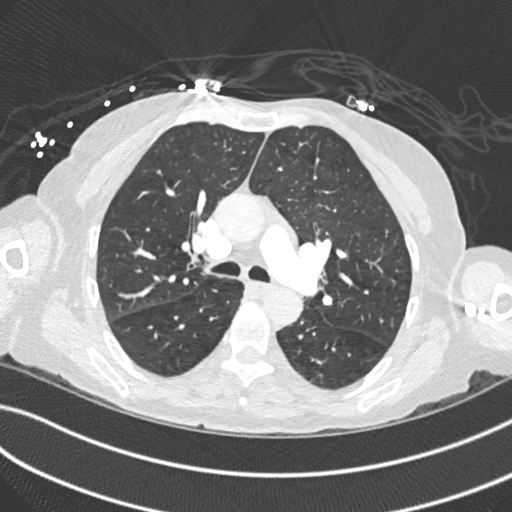
[im 290/447  mediastinal]
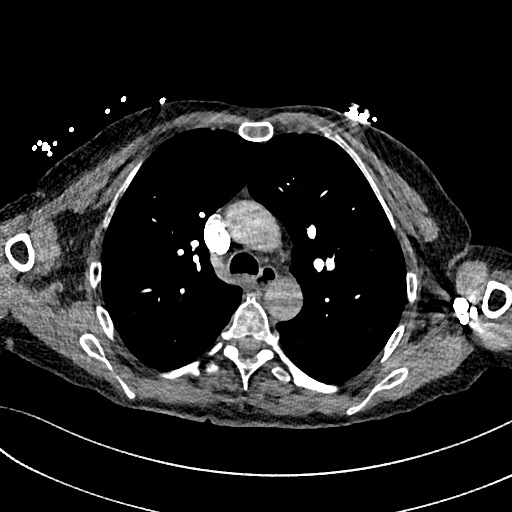
[im 313/447  lung]
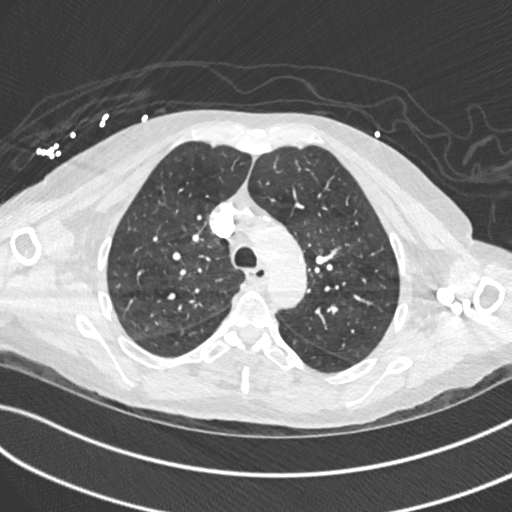
[im 335/447  mediastinal]
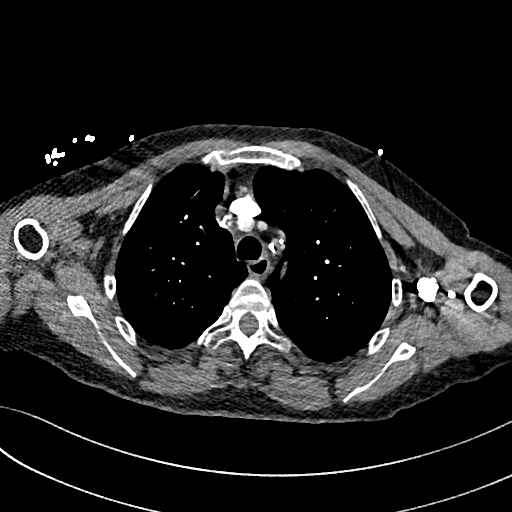
[im 357/447  lung]
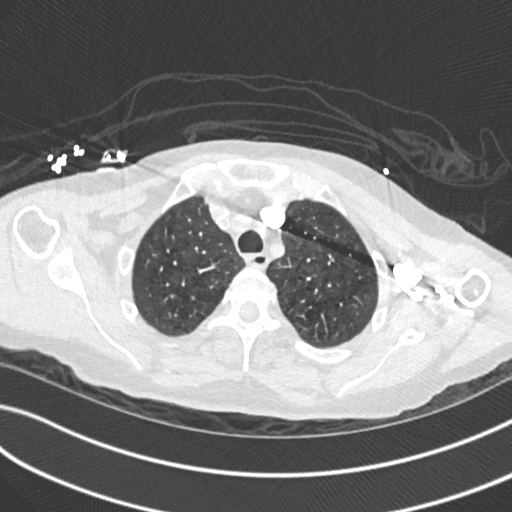
[im 380/447  mediastinal]
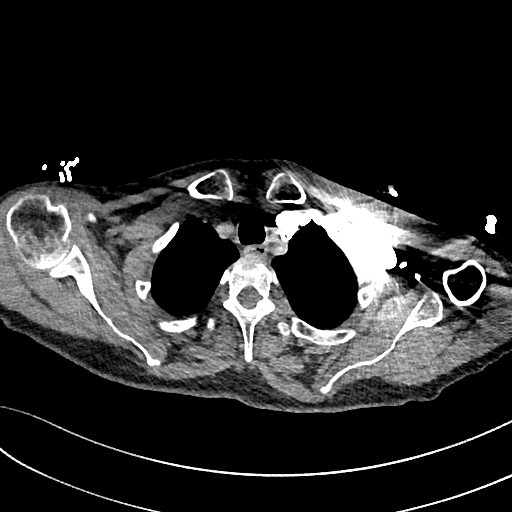
[im 402/447  lung]
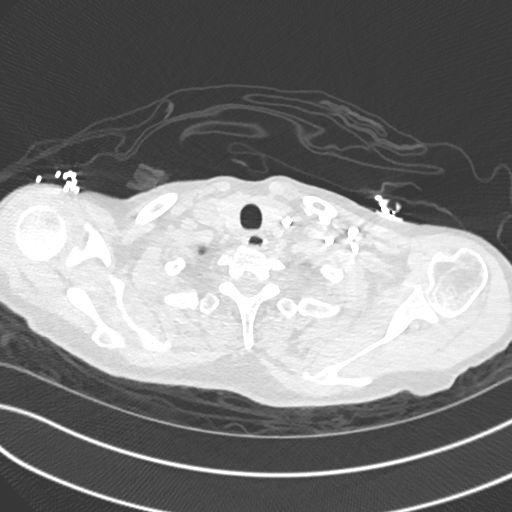
[im 424/447  mediastinal]
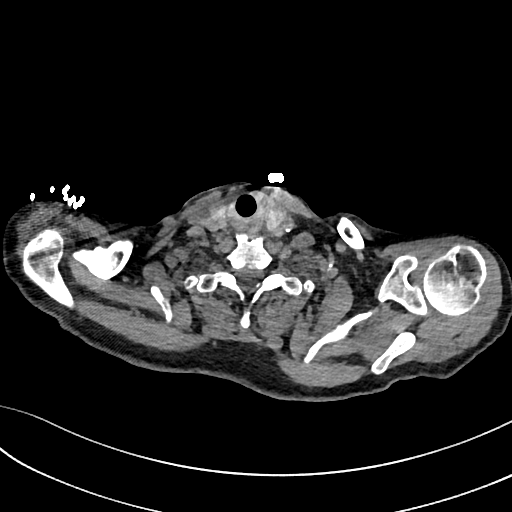

[Series 8: pe 2mm cor · coronal · 0.61mm/px · 1 of 125 slices shown]
[im 63/125  mediastinal]
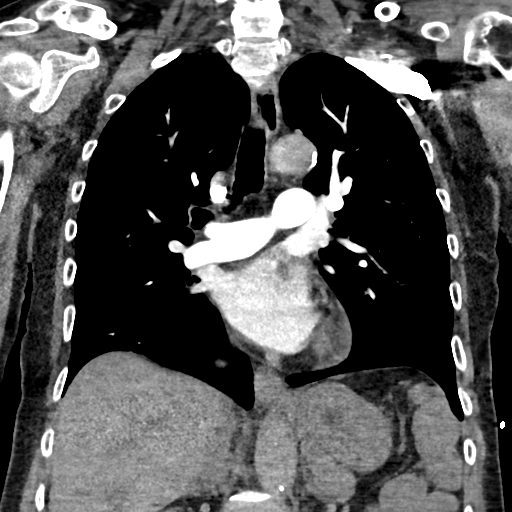

[19 of 36 positions shown; findings below may reference images not displayed]

FINDINGS: Cardiovascular: Pulmonary arterial opacification is adequate without
evidence of emboli. There is mild thoracic aortic atherosclerosis
without aneurysm. The heart is normal in size. There is no
pericardial effusion.

Mediastinum/Nodes: No enlarged axillary, mediastinal, or hilar lymph
nodes. Unremarkable thyroid and esophagus.

Lungs/Pleura: No pleural effusion or pneumothorax. Centrilobular
emphysema. Unchanged scattered small lung nodules with the largest
measuring 4 mm in the right upper lobe (series 6, image 96). Mild
dependent atelectasis in the right greater than left lower lobes.
Mild scarring in the right middle lobe.

Upper Abdomen: No acute abnormality.

Musculoskeletal: No acute osseous abnormality or suspicious osseous
lesion. Thoracic spondylosis. Old rib fractures.

Review of the MIP images confirms the above findings.
IMPRESSION: 1. No evidence of pulmonary emboli or other acute abnormality in the
chest.
2. Aortic Atherosclerosis ([id number].D) and Emphysema ([id number].X).

## 2021-10-05 IMAGING — CT CT HEAD W/O CM
4 series · 16 of 47 positions shown, 18 images · non-contrast
Comparison: Head CT 07/16/2018

CLINICAL DATA: Focal neuro deficit, > 6 hrs, stroke suspected

EXAM:
CT HEAD WITHOUT CONTRAST
TECHNIQUE: Contiguous axial images were obtained from the base of the skull
through the vertex without intravenous contrast.

[Series 3: head wo · axial · 0.46mm/px · z∈[-119,+1]mm · 7 of 34 slices shown, 9 images]
[im 5/34  brain]
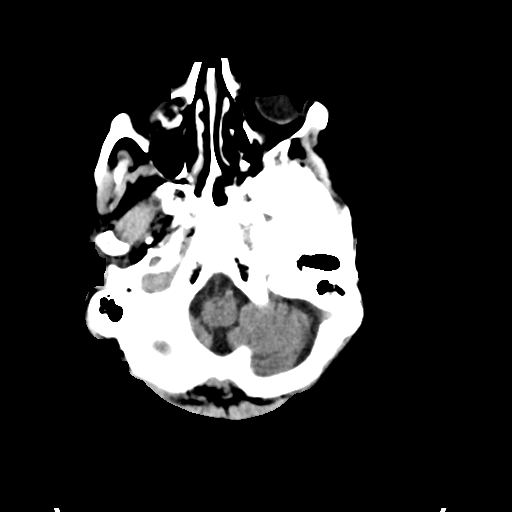
[im 5/34  bone]
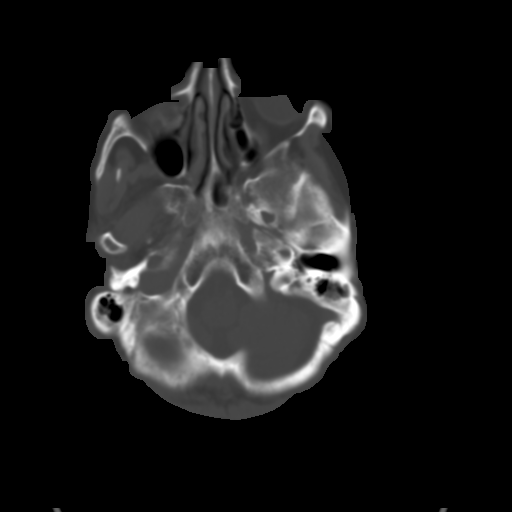
[im 9/34  brain]
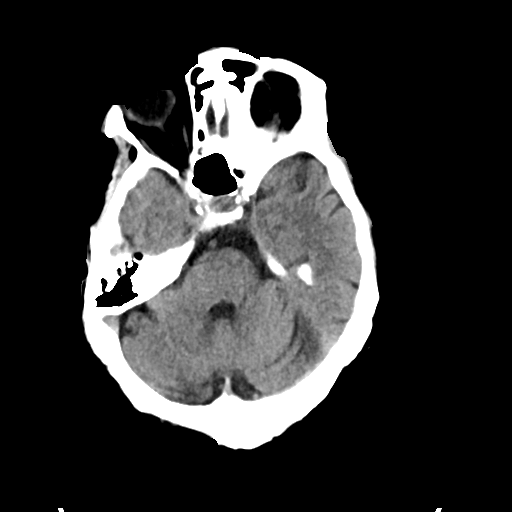
[im 13/34  brain]
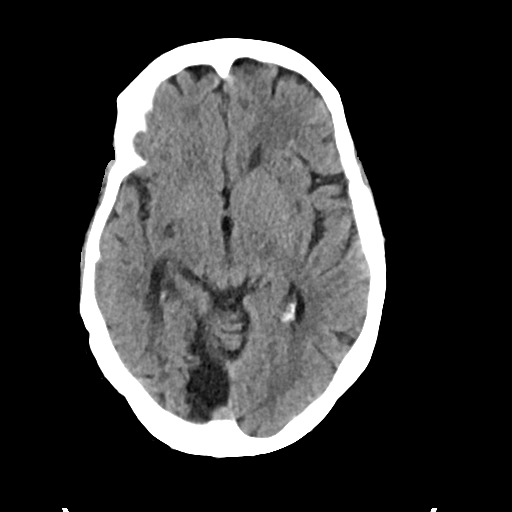
[im 17/34  brain]
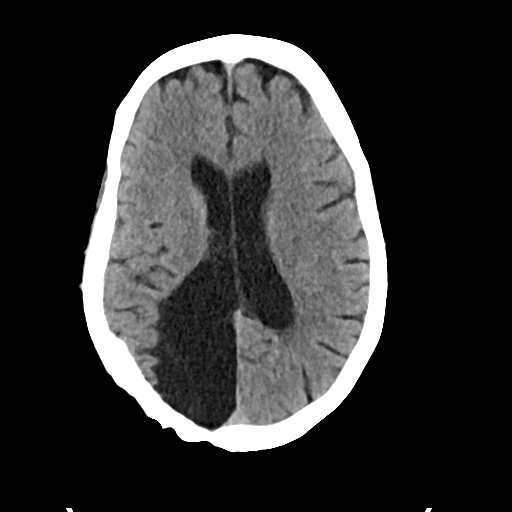
[im 21/34  brain]
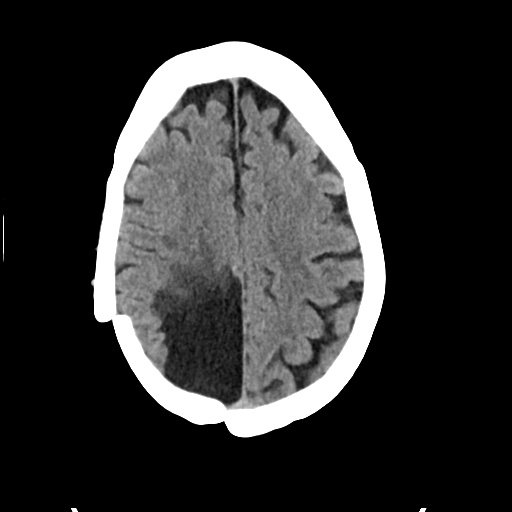
[im 21/34  bone]
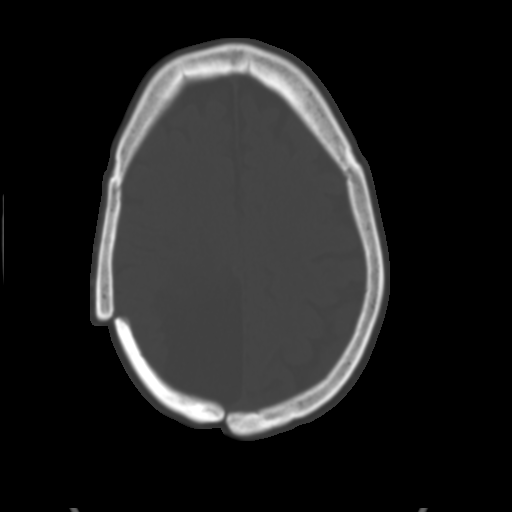
[im 25/34  brain]
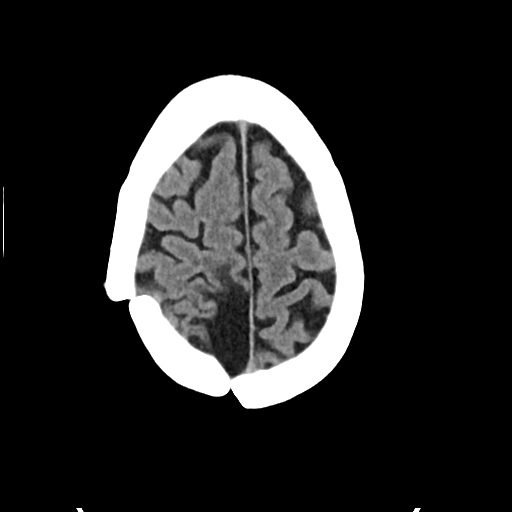
[im 29/34  brain]
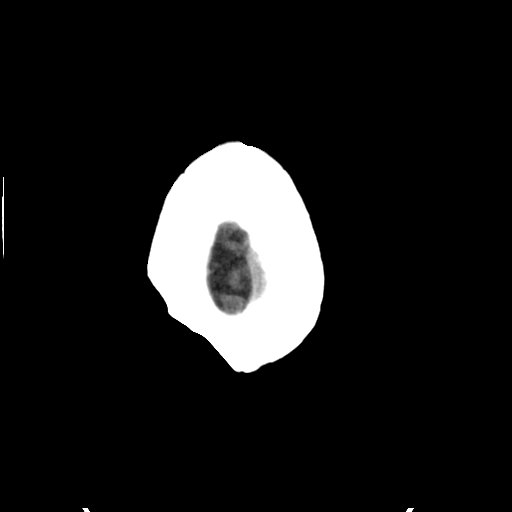

[Series 4: head bone · axial · 0.46mm/px · z∈[-123,-91]mm · 3 of 83 slices shown]
[im 9/83  bone]
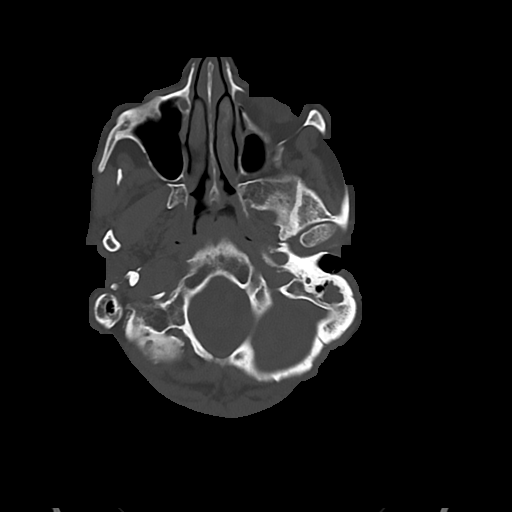
[im 17/83  bone]
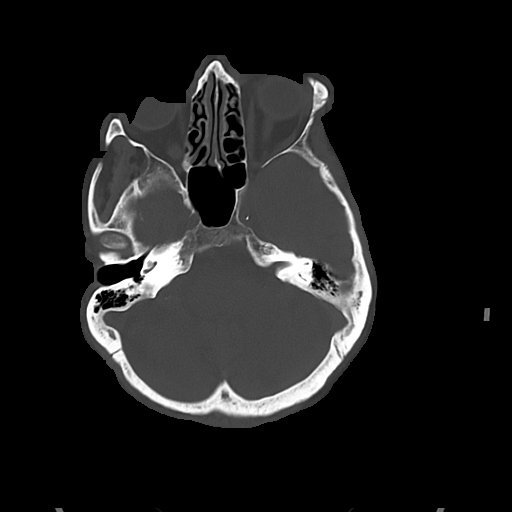
[im 25/83  bone]
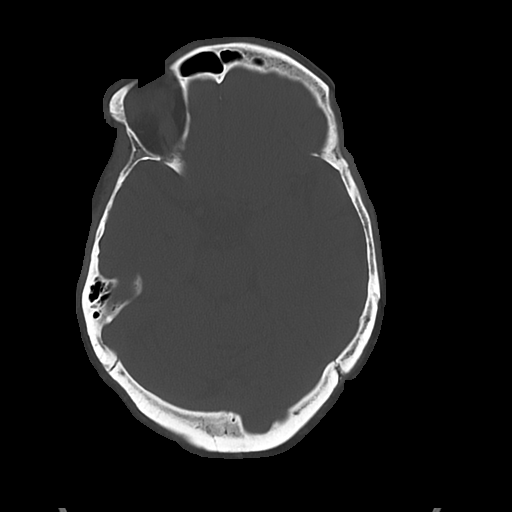

[Series 5: cor soft · coronal · 0.32mm/px · 3 of 71 slices shown]
[im 24/71  brain]
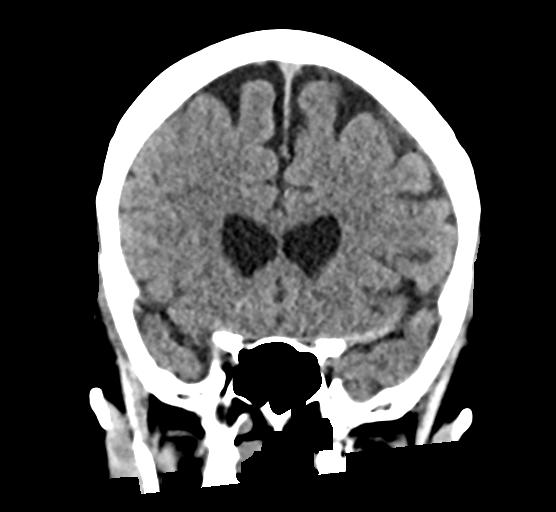
[im 32/71  brain]
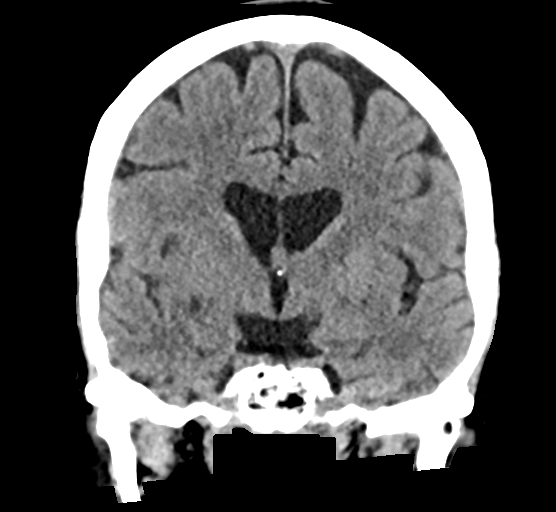
[im 39/71  brain]
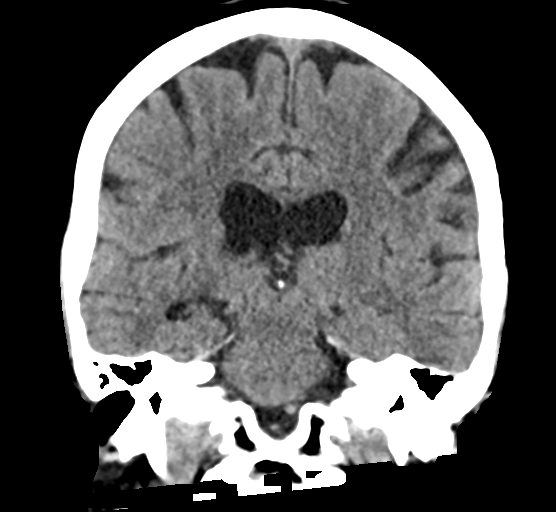

[Series 6: sag soft · sagittal · 0.37mm/px · 3 of 59 slices shown]
[im 20/59  brain]
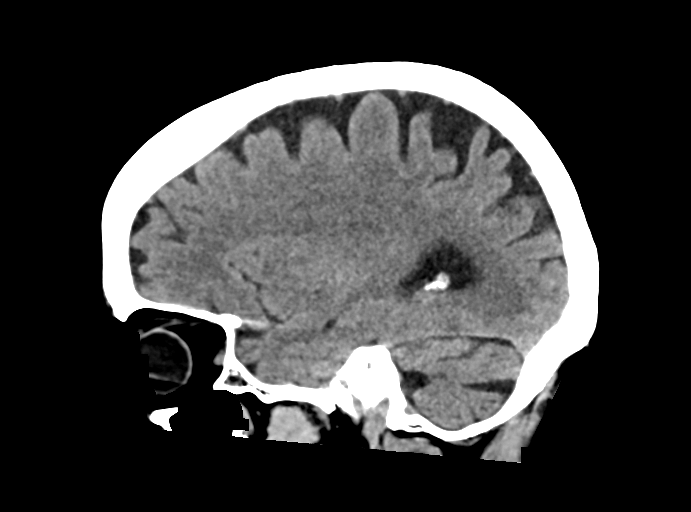
[im 30/59  brain]
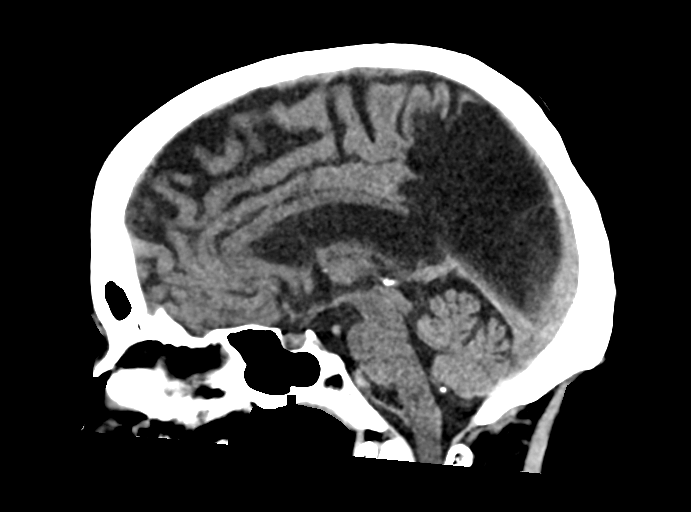
[im 39/59  brain]
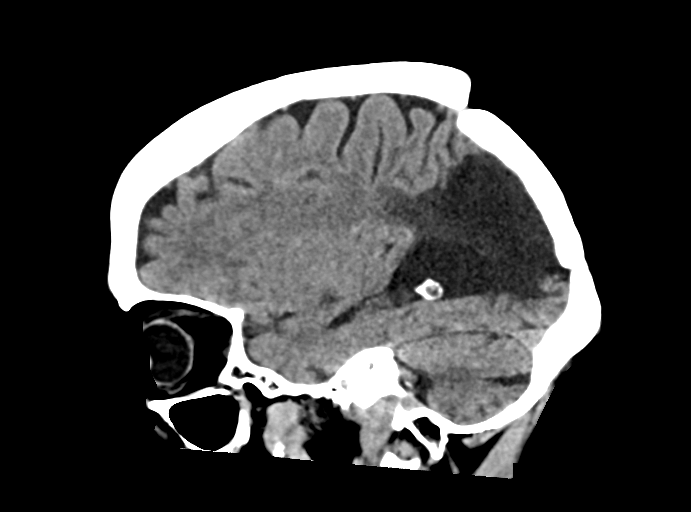

[16 of 47 positions shown; findings below may reference images not displayed]

FINDINGS: Brain: Stable large area of encephalomalacia in the right
parietooccipital lobe with associated ex vacuo dilatation of the
right lateral ventricle. No hemorrhage. No evidence of acute
ischemia. Stable atrophy. No subdural or extra-axial collection.
Basilar cisterns are patent. No hydrocephalus.

Vascular: Atherosclerosis of skullbase vasculature without
hyperdense vessel or abnormal calcification.

Skull: Right craniotomy, craniotomy flap no fracture or acute
abnormality. Unchanged in alignment from prior.

Sinuses/Orbits: Chronic opacification of lower left mastoid air
cells. No acute findings.

Other: None.
IMPRESSION: 1. No acute intracranial abnormality.
2. Unchanged encephalomalacia in the right parietooccipital lobe
with remote craniotomy.

## 2021-10-17 ENCOUNTER — Encounter: Payer: Self-pay | Admitting: Neurology

## 2021-10-17 ENCOUNTER — Other Ambulatory Visit: Payer: Self-pay

## 2021-10-17 ENCOUNTER — Ambulatory Visit: Payer: Medicare Other | Admitting: Neurology

## 2021-10-17 VITALS — BP 130/66 | HR 75 | Ht 67.0 in | Wt 145.6 lb

## 2021-10-17 DIAGNOSIS — G629 Polyneuropathy, unspecified: Secondary | ICD-10-CM | POA: Diagnosis not present

## 2021-10-17 DIAGNOSIS — G40219 Localization-related (focal) (partial) symptomatic epilepsy and epileptic syndromes with complex partial seizures, intractable, without status epilepticus: Secondary | ICD-10-CM | POA: Diagnosis not present

## 2021-10-17 DIAGNOSIS — G243 Spasmodic torticollis: Secondary | ICD-10-CM | POA: Diagnosis not present

## 2021-10-17 NOTE — Patient Instructions (Signed)
Good to see you. I hope you feel better soon. Let me look into getting Botox approved this time. Follow-up in 6 months, call for any changes. ? ? ?Seizure Precautions: ?1. If medication has been prescribed for you to prevent seizures, take it exactly as directed.  Do not stop taking the medicine without talking to your doctor first, even if you have not had a seizure in a long time.  ? ?2. Avoid activities in which a seizure would cause danger to yourself or to others.  Don't operate dangerous machinery, swim alone, or climb in high or dangerous places, such as on ladders, roofs, or girders.  Do not drive unless your doctor says you may. ? ?3. If you have any warning that you may have a seizure, lay down in a safe place where you can't hurt yourself.   ? ?4.  No driving for 6 months from last seizure, as per [facility].   Please refer to the following link on the [employer] website for more information: http://www.epilepsyfoundation.org/answerplace/Social/driving/drivingu.cfm  ? ?5.  Maintain good sleep hygiene. Avoid alcohol. ? ?6.  Contact your doctor if you have any problems that may be related to the medicine you are taking. ? ?7.  Call 911 and bring the patient back to the ED if: ?      ? A.  The seizure lasts longer than 5 minutes.      ? B.  The patient doesn't awaken shortly after the seizure ? C.  The patient has new problems such as difficulty seeing, speaking or moving ? D.  The patient was injured during the seizure ? E.  The patient has a temperature over 102 F (39C) ? F.  The patient vomited and now is having trouble breathing ?      ? ?

## 2021-10-17 NOTE — Progress Notes (Signed)
? ?NEUROLOGY FOLLOW UP OFFICE NOTE ? ?[name] ?[id number] ?1956/01/15 ? ?HISTORY OF PRESENT ILLNESS: ?I had the pleasure of seeing [name] in follow-up in the neurology clinic on 10/17/2021.  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 She is again accompanied by her daughter [name] who helps supplement the history today.  Records and images were personally reviewed where available.  Since her last visit, they report that she had been doing really well from a seizure standpoint on her current regimen of Dilantin '100mg'$  in AM, '130mg'$  in PM, Lacosamide '200mg'$  BID, and Cenobamate '100mg'$  daily, until she had an upper respiratory infection the past week. Her whole family had been sick, and [name] reports she has had 3-4 seizures in the past 2 weeks, stating she has been sick the entire time with the increase in seizures. Her son-in-law witnessed one of them, she had a blank stare and a twitch but not so much of the jerking, lasting a few seconds. This caused her to fall in the bathroom. Headaches were intermittent prior to this recent illness, but the past 2 weeks she has been having more headaches and she feels dizzy, very unsteady while trying to walk. They report her balance is way off and she had to use a wheelchair for today's visit. She also feels she is having more head tremors, however [name] notes they were not as bad when she was not sick.  ? ? 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 She had been seeing epileptologist [name], records were reviewed and will be summarized as follows. In the past, seizures would start with headache and burning sensation of the left arm, which may be followed by left arm jerking. Over time, she has not had any prior  warning symptoms. She has had seizures where she is noted to have kicking and rocking movements of the pelvis with unresponsiveness. She was in the EMU in 10/2012 where 11 seizures were captured from the right posterior hemisphere/right inferior temporal region. Interictal EEG showed abundant right posterior temporal epileptiform discharges, frequent right anterior temporal epileptiform discharges, as well as occasional independent left anterior temporal epileptiform discharges. There was focal slowing over the right posterior hemisphere. MRI brain in 2014 showed right parietal/occipital encephalomalacia, right hippocampus smaller than left. PET scan in 2015 showed multiple areas of hypometabolism on the right, mostly corresponding to or adjacent to the structural abnormality. Ictal SPECT showed right hemisphere hypoperfusion. SISCOM showed right frontal region significant on the subtraction. MEG in 07/2013 showed spikes localizing to the right occipital, parietal, mesial temporal regions with a weak signal left mesial parietal. Concomitant EEG showed right posterior spikes. Functional mapping suggestive of left language lateralization. She has tried multiple AEDs in the past with various side effects or ineffective, and has been taking Dilantin '160mg'$  in AM, '200mg'$  in PM and Vimpat '200mg'$  BID with seizures around once a month. She was admitted to [facility] on 03/19/18 for left leg weakness and drowsiness. She was fishing with her boyfriend when she suddenly could not move her left leg and almost fell. She states that her boyfriend told her that she was shaking and had a seizure while they were fishing. She was brought to [name] where Dilantin level was 33.8. She was adamant that she has been  taking her medications as instructed and did not take more than prescribed, no new medications taken. She had an MRI brain without contrast which I personally reviewed, no acute changes seen. Large volume right parieto-occipital  encephalomalacia was unchanged. Right hippocampus is smaller. She continued to report weakness and had an MRI cervical and lumbar spine which did not show any myelopathy, there was prominent left-sided facet hypertrophy at C4-5 with resultant moderate left C5 foraminal stenosis, degenerative disc osteophyte at C5-6 with severe right C6 foraminal stenosis, and mild spinal stenosis at C5-6 and C6-7, chronic borderline to mild multifactorial spinal stenosis at L2-3 through L4-5 with no convincing lateral recess or foraminal stenosis. She continued to report burning pain in her legs and was started on low dose gabapentin '300mg'$  qhs with no side effects. Dilantin was held in the hospital, she was discharged on a lower dose, but states that she has been taking her pre-hospital dose of '160mg'$  in AM, '200mg'$  in PM but has had 4 seizures since hospital discharge 2 weeks ago, which is unusual. She had 2 seizures in one day in the car with her mother-in-law, her mother-in-law had to pull over. The next day she had 2 at home, her daughter found her in the bathroom. She has not been sleeping well the past few days. She has a headache after the seizures. Aside from the convulsions, she has episodes where a feeling comes over her left side, from the arm down her leg, then she is weaker after. She denies any jerking. She is quite anxious in the office today and perseverates on her symptoms. She states she "stays nervous." She reports taking prn Ativan in the past, but discontinued at [facility]. She repeatedly states her feet are numb and they "feel like they are grabbed." She feels gabapentin has helped some with this.  ?  ?Prior AEDs: phenobarbital (ineffective, mood issues), Depakote (multiple trials, ineffective), Keppra (ineffective), Zonisamide (side effects on brief trial of higher dose than prescribed - '500mg'$  instead of '200mg'$ ), Onfi (mouth sores), Tegretol (ineffective), Lamictal (nausea, dizziness, malaise), Felbatol (nausea,  dizziness, malaise), Topamax (ineffective), Tigabine (tried per notes, details unclear), Neurontin (ineffective, nausea), Lyrica (leg pain reported on '25mg'$  daily, tingling), Briviact (drowsiness), Fycompa ?  ?Epilepsy Risk Factors:  Right parieto-occipital AVM s/p rupture and hemorrhage in 1988, s/p craniectomy. Otherwise she had a normal birth and early development.  There is no history of febrile convulsions, CNS infections such as meningitis/encephalitis, or family history of seizures. ? ?Diagnostic Data:  ?MRI brain without contrast 05/2018 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 ? ?EEG done 06/2018 showed right posterior temporal slowing, occasional epileptiform discharges over the right posterior temporal region, breach artifact in this region.  ? ?MRI cervical, thoracic, and lumbar spine done 07/2018 showed no convincing cervical or thoracic spinal cord abnormality. Mild degenerative cervical spinal stenosis at C5-6 and C6-7 with moderate or severe bilateral foraminal stenosis; no thoracic spinal or foraminal stenosis; stable borderline to mild lumbar spinal stenosis ? ?MRI lumbar spine in 2019 showed borderline to mild multifactorial spinal stenosis at L2-L3 and L3-L4 related to circumferential disc bulge and posterior element ?Hypertrophy. ? 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  ? ? ?PAST MEDICAL HISTORY: ?Past Medical History:  ?Diagnosis Date  ? Asthma   ? Cerebral hemorrhage ([place]) 1989  ? Chronic back pain   ? COPD (chronic obstructive  pulmonary disease) ([place])   ? DDD (degenerative disc disease) 06/12/2013  ? DVT (deep venous thrombosis) ([place])   ? "she's had several since 1990"  ? GERD (gastroesophageal reflux disease)   ? Headache   ? "usually around time when she's had a seizure"  ? History of blood transfusion   ? "when  she was a baby"  ? History of stomach ulcers   ? Kidney stones   ? Neuropathy   ? Seizures ([place])   ? "because of her brain surgery"  ? Stroke [facility]) [phone]  ? family denies residual on 11/09/2014  ? Tunnel vision   ? "s

## 2021-12-03 ENCOUNTER — Other Ambulatory Visit: Payer: Self-pay | Admitting: Neurology

## 2022-01-09 ENCOUNTER — Encounter: Payer: Self-pay | Admitting: Neurology

## 2022-02-04 IMAGING — CT CT CERVICAL SPINE W/O CM
3 of 4 series · 13 of 33 positions shown, 16 images · non-contrast
Comparison: MR brain dated 08/26/2019. MR cervical spine dated
07/18/2018.

CLINICAL DATA: Trauma/fall, syncope.  Epilepsy.

EXAM:
CT HEAD WITHOUT CONTRAST
CT CERVICAL SPINE WITHOUT CONTRAST
TECHNIQUE: Multidetector CT imaging of the head and cervical spine was
performed following the standard protocol without intravenous
contrast. Multiplanar CT image reconstructions of the cervical spine
were also generated.

[Series 1: c_spine 2.0 st · axial · 0.39mm/px · z∈[-258,-130]mm · 5 of 97 slices shown, 7 images]
[im 17/97  soft-tissue]
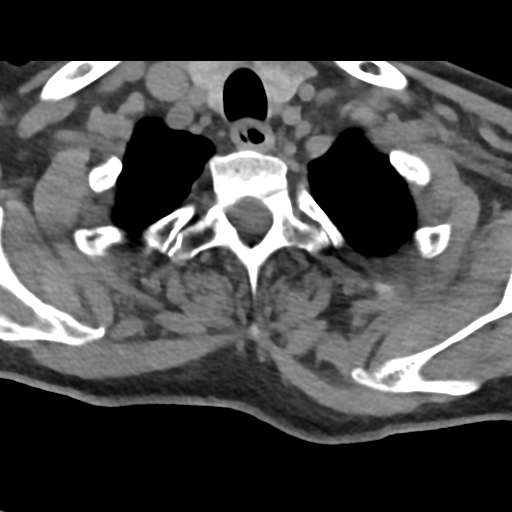
[im 17/97  bone]
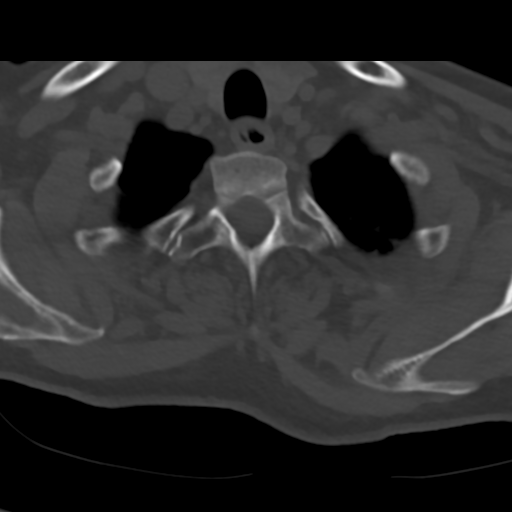
[im 33/97  bone]
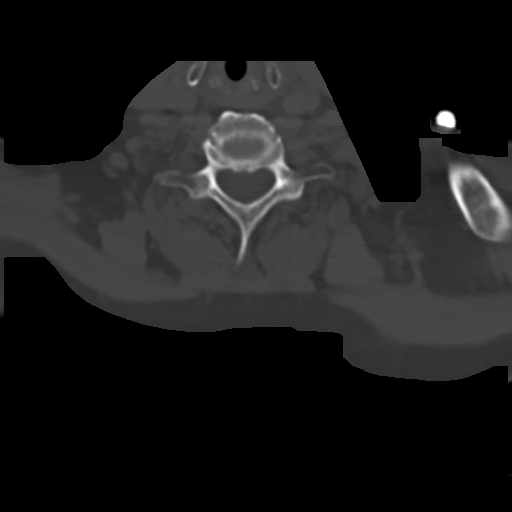
[im 49/97  bone]
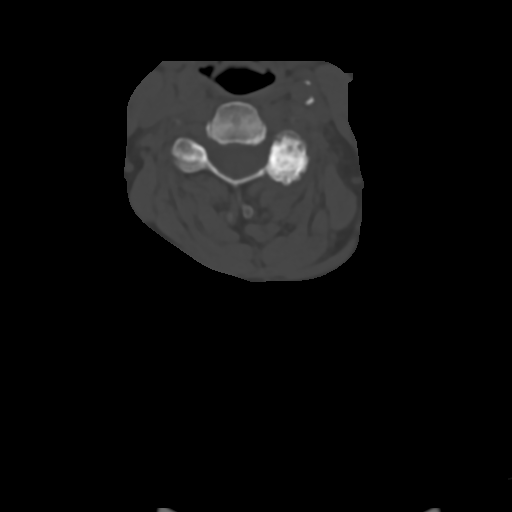
[im 65/97  bone]
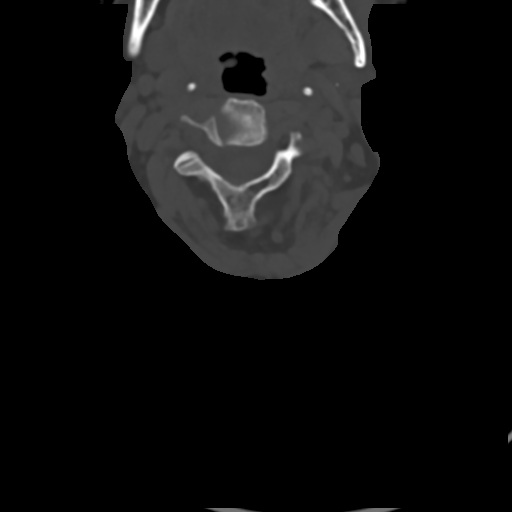
[im 81/97  soft-tissue]
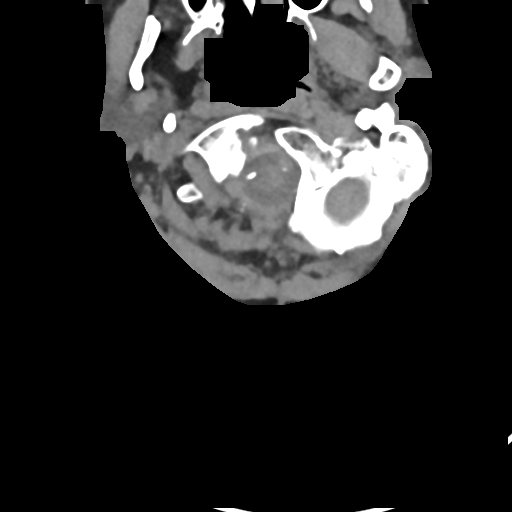
[im 81/97  bone]
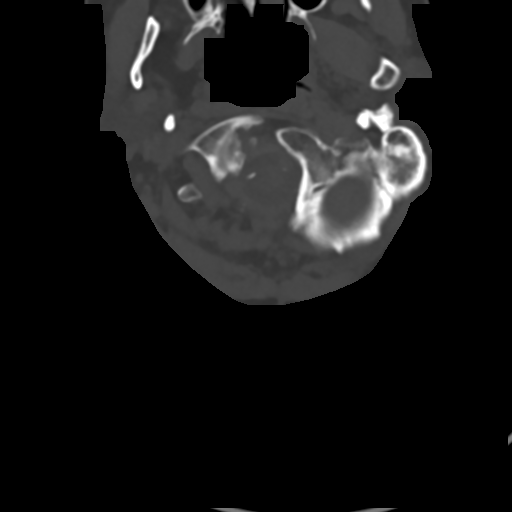

[Series 7: c_spine 2.0 sag bone · sagittal · 0.35mm/px · 5 of 66 slices shown, 6 images]
[im 22/66  bone]
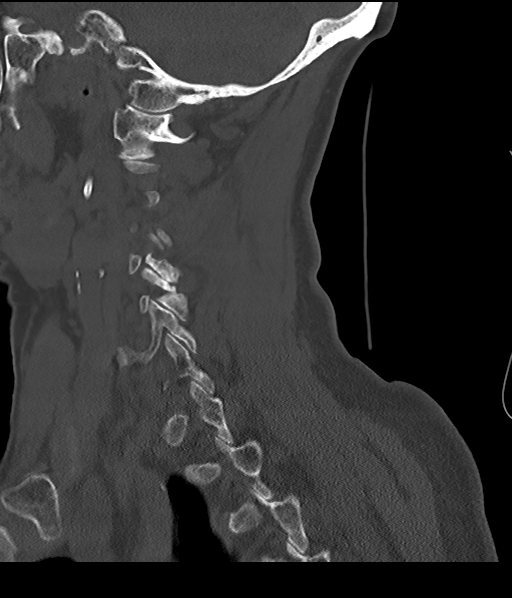
[im 28/66  bone]
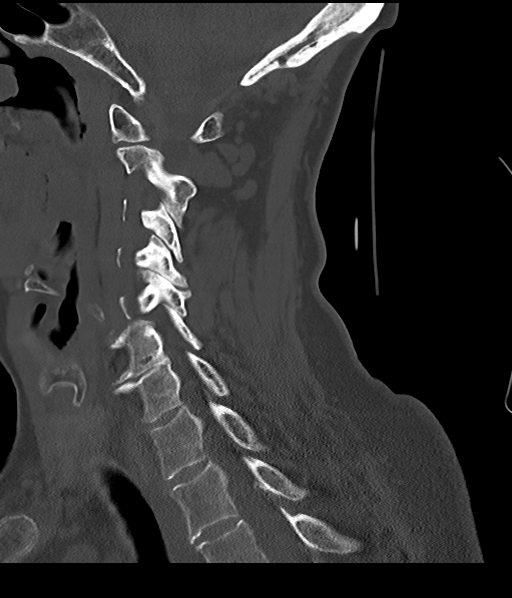
[im 33/66  soft-tissue]
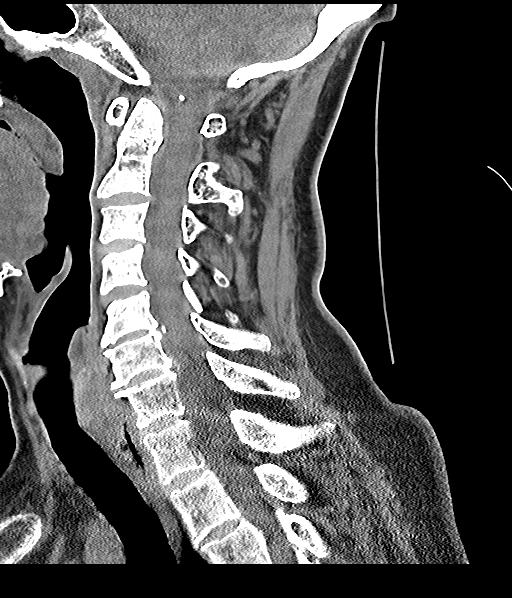
[im 33/66  bone]
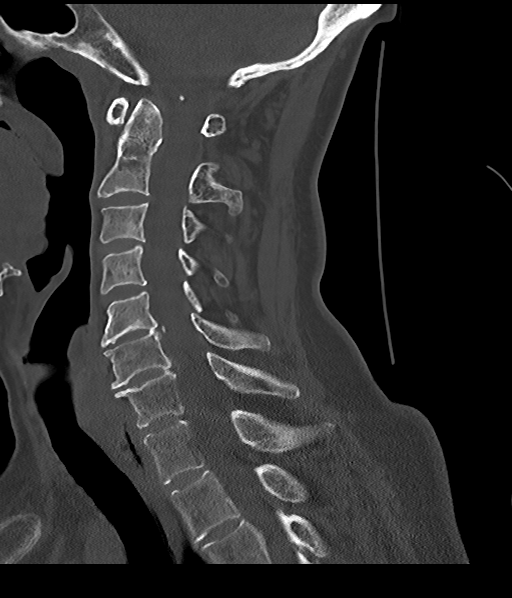
[im 38/66  bone]
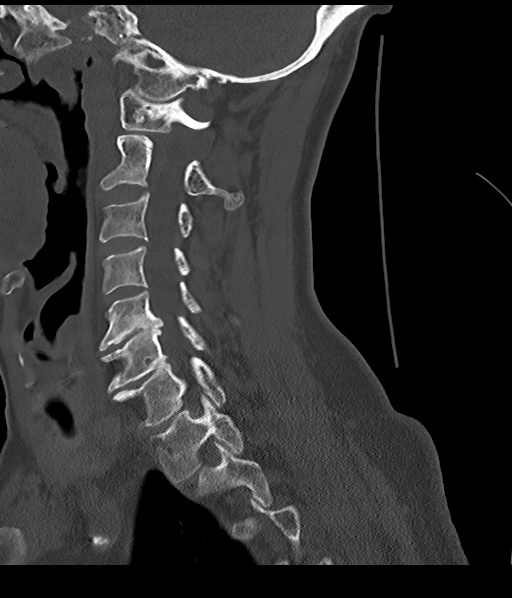
[im 44/66  bone]
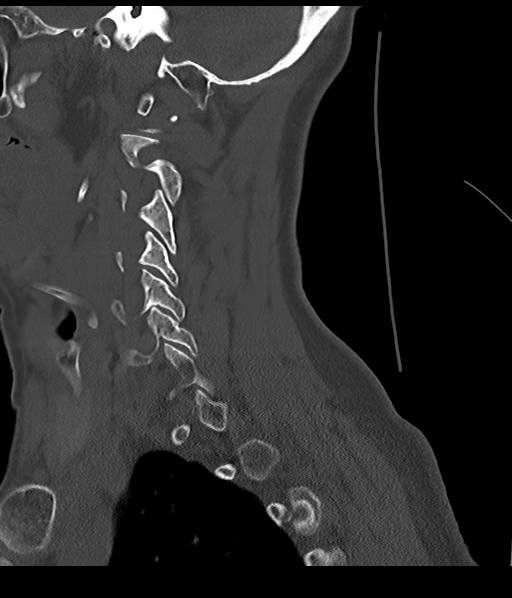

[Series 8: c_spine 2.0 cor bone · coronal · 0.28mm/px · 3 of 75 slices shown]
[im 15/75  bone]
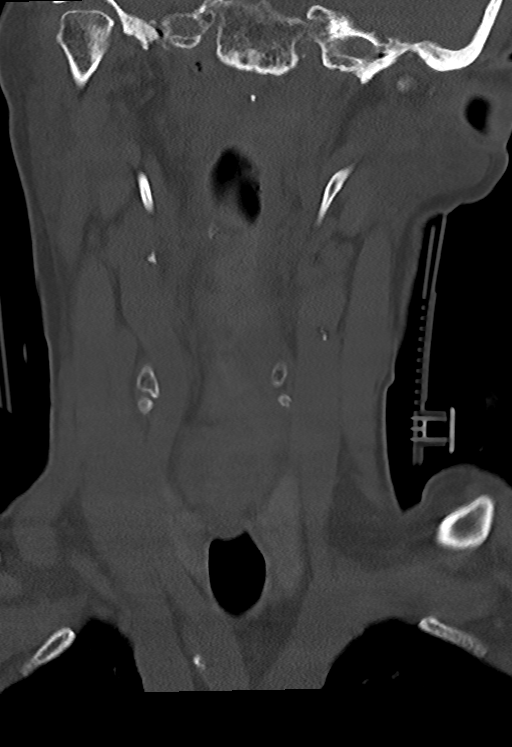
[im 30/75  bone]
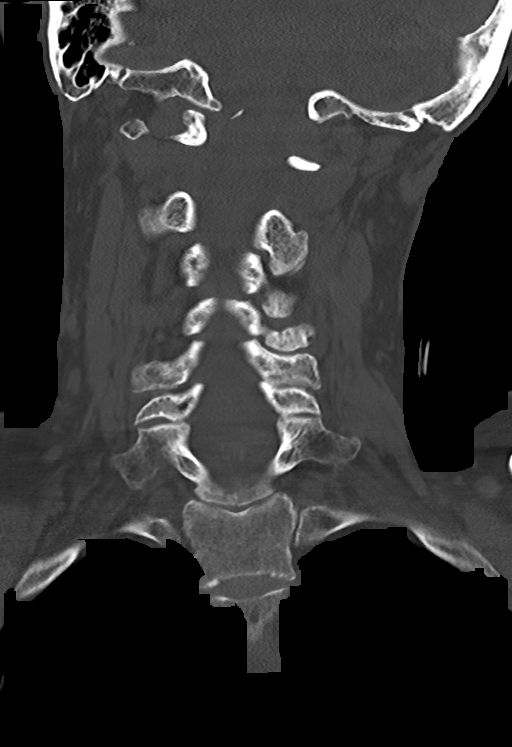
[im 45/75  bone]
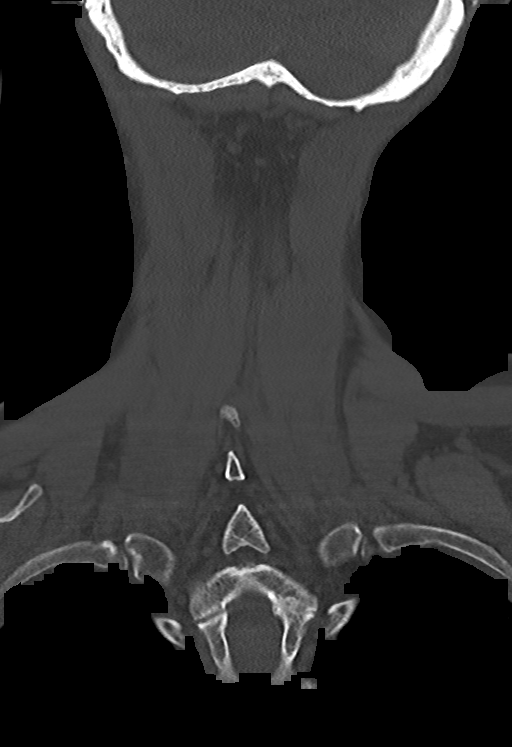

[13 of 33 positions shown; findings below may reference images not displayed]

FINDINGS: CT HEAD FINDINGS

Brain: No evidence of acute infarction, hemorrhage, hydrocephalus,
extra-axial collection or mass lesion/mass effect.

Encephalomalacic changes in the right parieto-occipital region.

Mild small vessel ischemic changes.

Vascular: Mild intracranial atherosclerosis.

Skull: Old right parieto-occipital craniotomy.  No acute fracture.

Sinuses/Orbits: Visualized paranasal sinuses are clear. Partial
opacification of the left mastoid air cells.

Other: None.

CT CERVICAL SPINE FINDINGS

Alignment: Normal cervical lordosis.

Skull base and vertebrae: No acute fracture. No primary bone lesion
or focal pathologic process.

Soft tissues and spinal canal: No prevertebral fluid or swelling. No
visible canal hematoma.

Disc levels: Mild degenerative changes at C5-6 and C6-7. Spinal
canal is patent.

Upper chest: Mild emphysematous changes with biapical
pleural-parenchymal scarring.

Other: Visualized thyroid is unremarkable.
IMPRESSION: No evidence of acute intracranial abnormality. Old right
parieto-occipital encephalomalacic changes with overlying
craniotomy.

No evidence of traumatic injury to the cervical spine. Mild
degenerative changes of the mid/lower cervical spine.

## 2022-02-04 IMAGING — CT CT HEAD W/O CM
4 series · 16 of 47 positions shown, 18 images · non-contrast
Comparison: MR brain dated 08/26/2019. MR cervical spine dated
07/18/2018.

CLINICAL DATA: Trauma/fall, syncope.  Epilepsy.

EXAM:
CT HEAD WITHOUT CONTRAST
CT CERVICAL SPINE WITHOUT CONTRAST
TECHNIQUE: Multidetector CT imaging of the head and cervical spine was
performed following the standard protocol without intravenous
contrast. Multiplanar CT image reconstructions of the cervical spine
were also generated.

[Series 4: head without · axial · non-contrast · 0.46mm/px · z∈[-96,+34]mm · 7 of 36 slices shown, 9 images]
[im 5/36  brain]
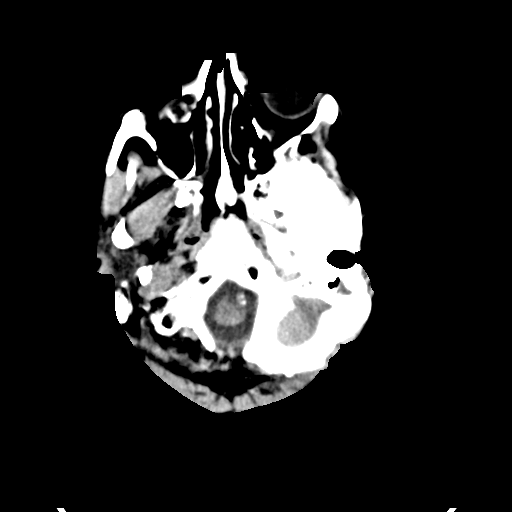
[im 5/36  bone]
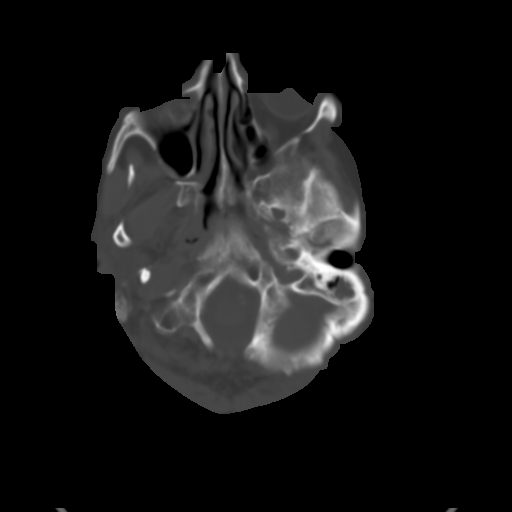
[im 9/36  brain]
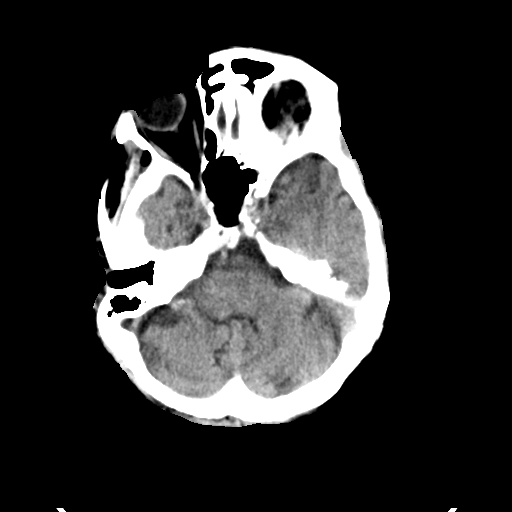
[im 14/36  brain]
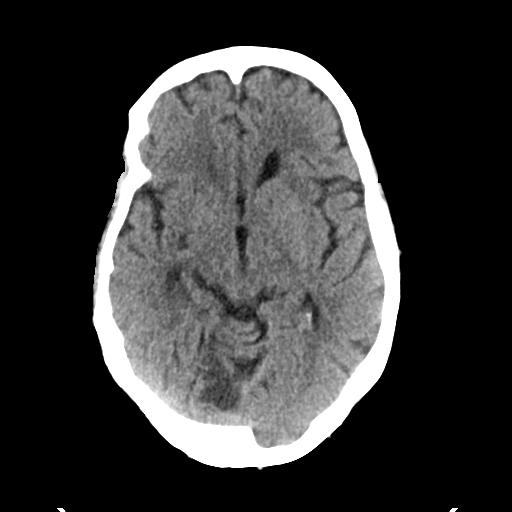
[im 18/36  brain]
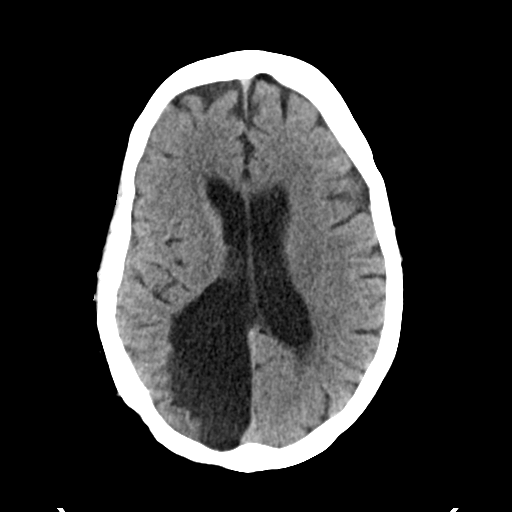
[im 22/36  brain]
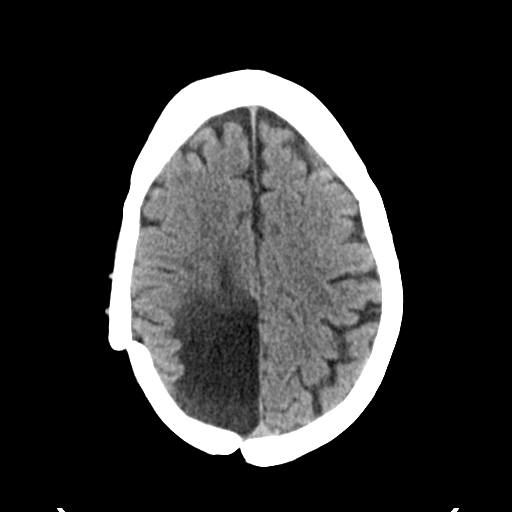
[im 22/36  bone]
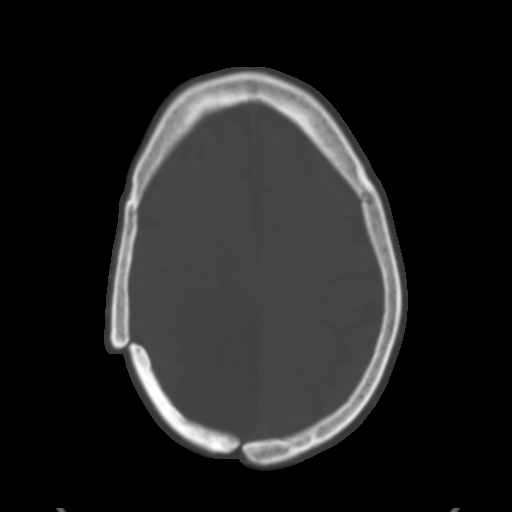
[im 27/36  brain]
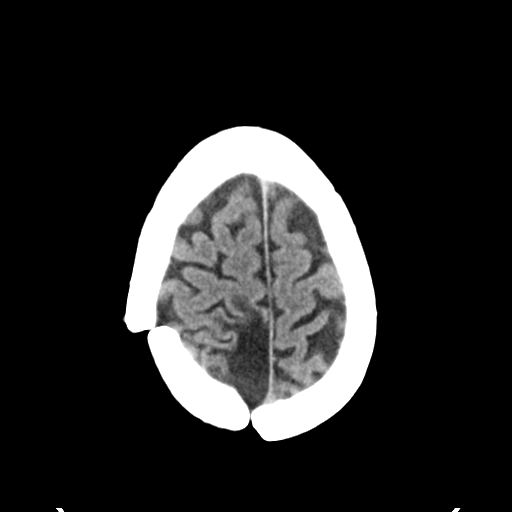
[im 31/36  brain]
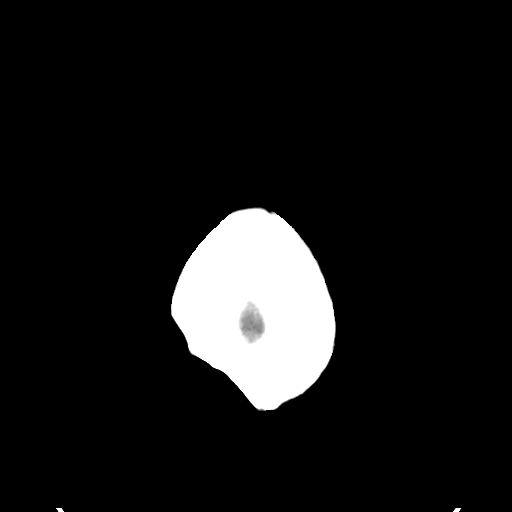

[Series 5: head bone · axial · 0.46mm/px · z∈[-100,-64]mm · 3 of 89 slices shown]
[im 9/89  bone]
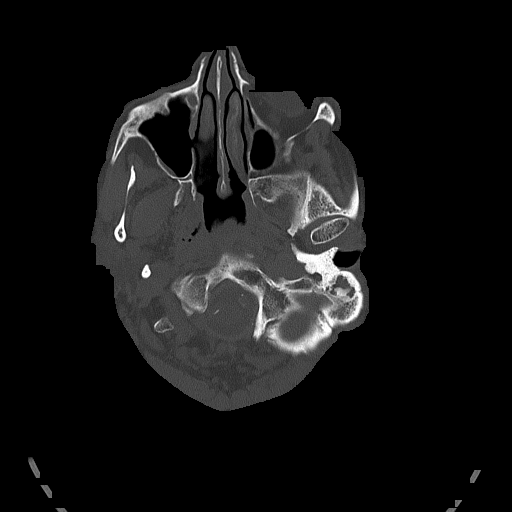
[im 18/89  bone]
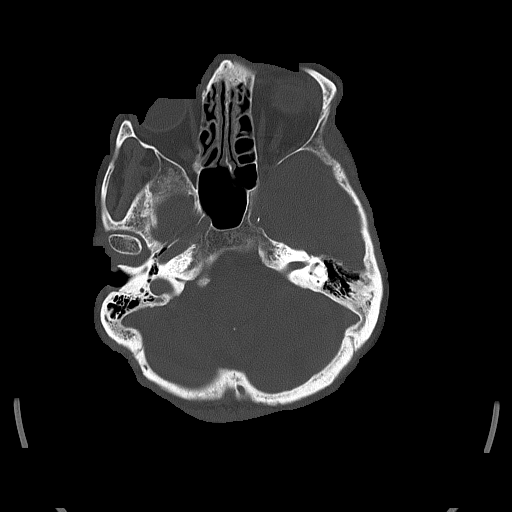
[im 27/89  bone]
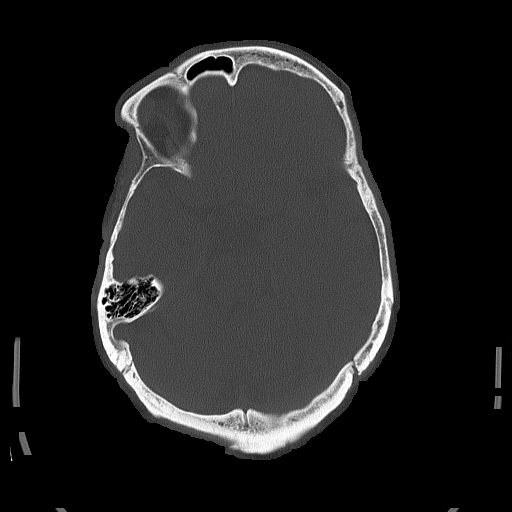

[Series 6: head without cor · coronal · non-contrast · 0.35mm/px · 3 of 73 slices shown]
[im 25/73  brain]
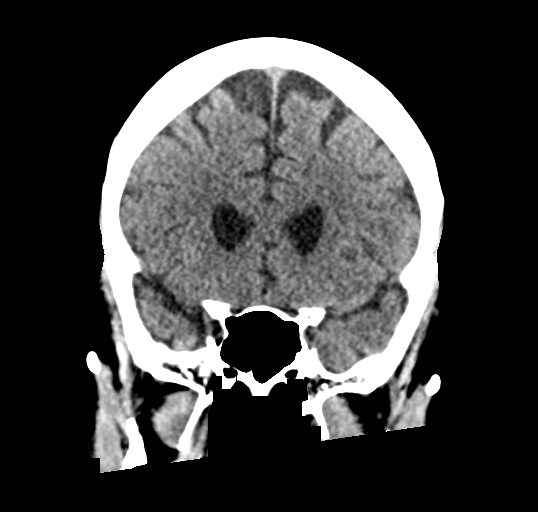
[im 33/73  brain]
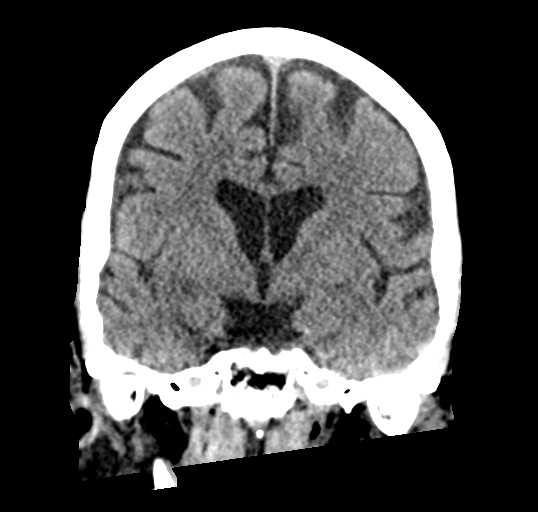
[im 41/73  brain]
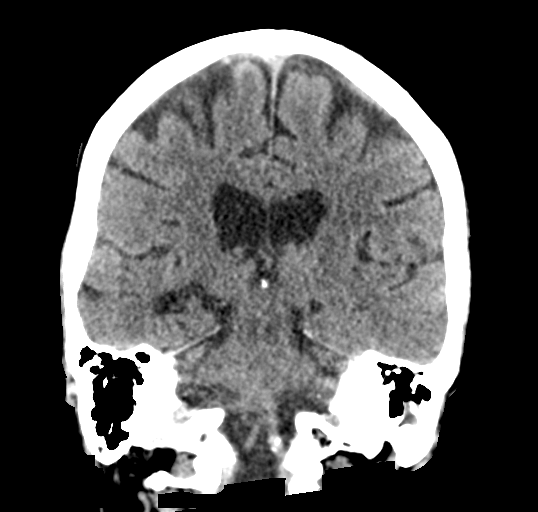

[Series 7: head without sag · sagittal · non-contrast · 0.35mm/px · 3 of 60 slices shown]
[im 25/60  brain]
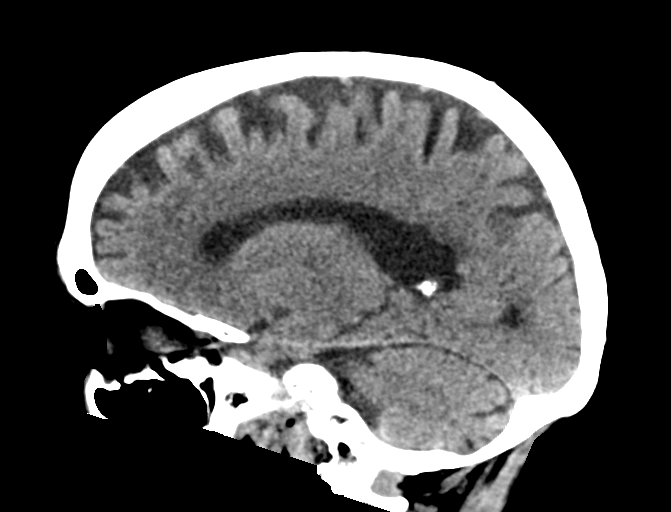
[im 30/60  brain]
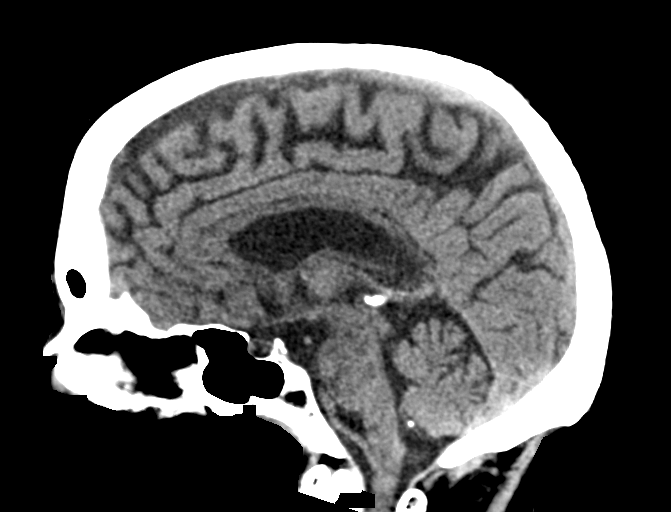
[im 35/60  brain]
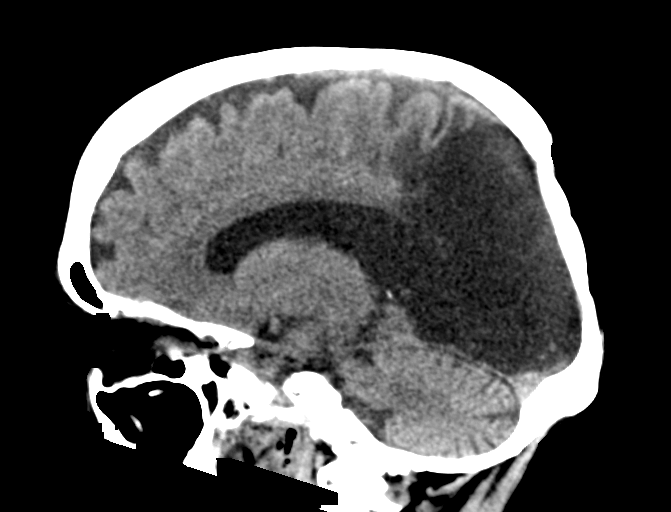

[16 of 47 positions shown; findings below may reference images not displayed]

FINDINGS: CT HEAD FINDINGS

Brain: No evidence of acute infarction, hemorrhage, hydrocephalus,
extra-axial collection or mass lesion/mass effect.

Encephalomalacic changes in the right parieto-occipital region.

Mild small vessel ischemic changes.

Vascular: Mild intracranial atherosclerosis.

Skull: Old right parieto-occipital craniotomy.  No acute fracture.

Sinuses/Orbits: Visualized paranasal sinuses are clear. Partial
opacification of the left mastoid air cells.

Other: None.

CT CERVICAL SPINE FINDINGS

Alignment: Normal cervical lordosis.

Skull base and vertebrae: No acute fracture. No primary bone lesion
or focal pathologic process.

Soft tissues and spinal canal: No prevertebral fluid or swelling. No
visible canal hematoma.

Disc levels: Mild degenerative changes at C5-6 and C6-7. Spinal
canal is patent.

Upper chest: Mild emphysematous changes with biapical
pleural-parenchymal scarring.

Other: Visualized thyroid is unremarkable.
IMPRESSION: No evidence of acute intracranial abnormality. Old right
parieto-occipital encephalomalacic changes with overlying
craniotomy.

No evidence of traumatic injury to the cervical spine. Mild
degenerative changes of the mid/lower cervical spine.

## 2022-02-04 IMAGING — CT CT L SPINE W/O CM
3 series · 14 of 33 positions shown, 17 images · non-contrast
Comparison: None.

CLINICAL DATA: Fall, low back pain

EXAM:
CT LUMBAR SPINE WITHOUT CONTRAST
TECHNIQUE: Multidetector CT imaging of the lumbar spine was performed without
intravenous contrast administration. Multiplanar CT image
reconstructions were also generated.

[Series 7: l-spine 2.0 st · axial · 0.42mm/px · z∈[-661,-461]mm · 6 of 131 slices shown, 8 images]
[im 21/131  soft-tissue]
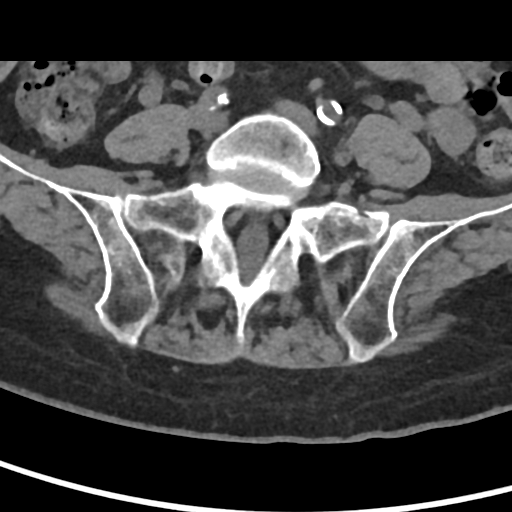
[im 21/131  bone]
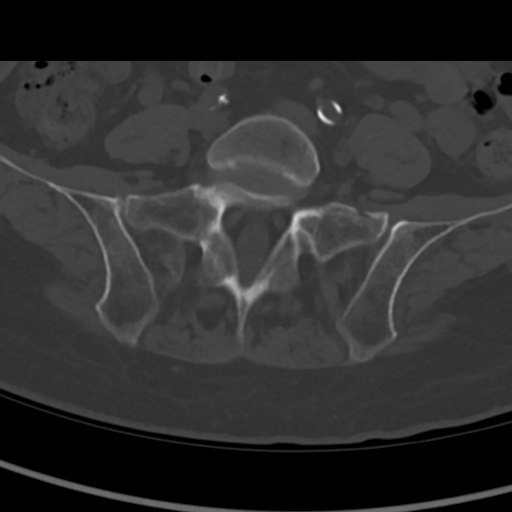
[im 41/131  bone]
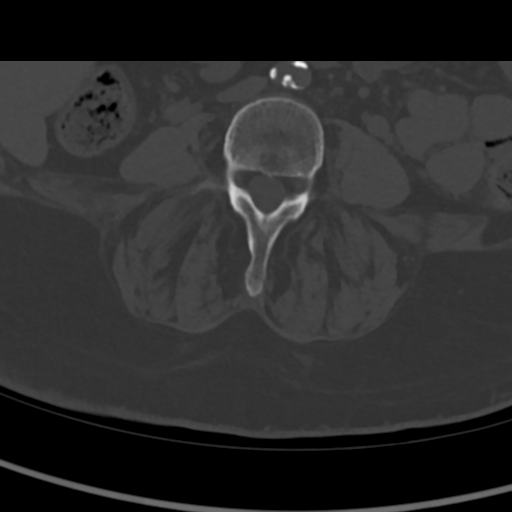
[im 61/131  bone]
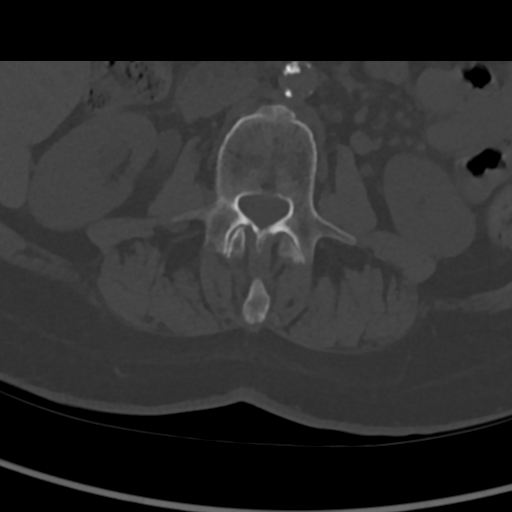
[im 81/131  bone]
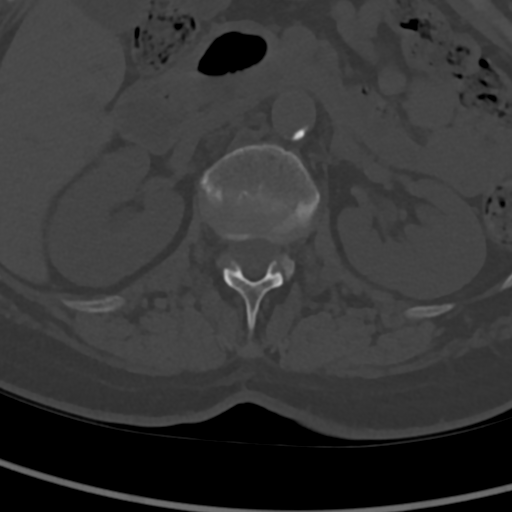
[im 101/131  soft-tissue]
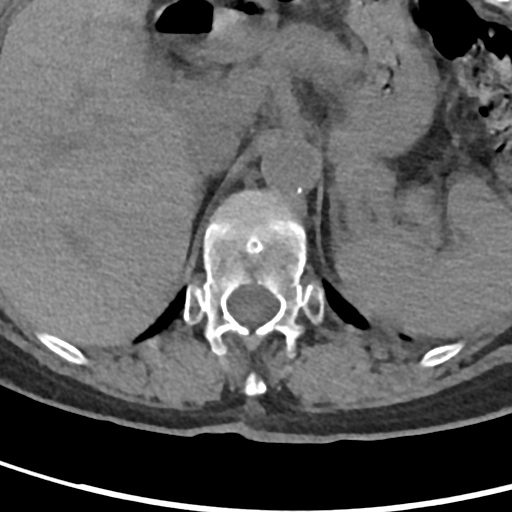
[im 101/131  bone]
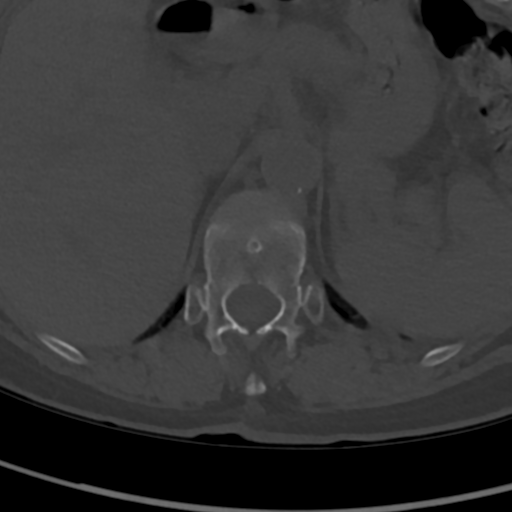
[im 121/131  bone]
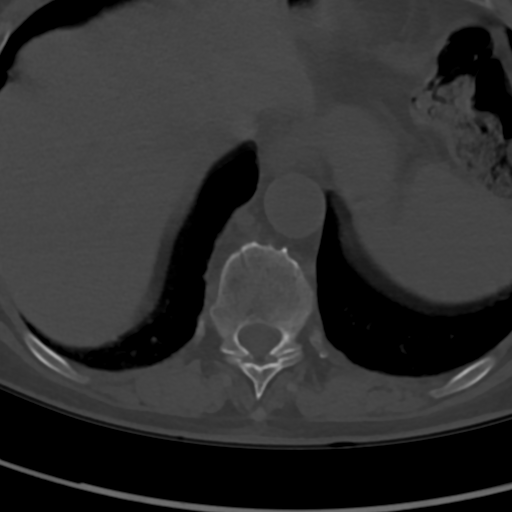

[Series 11: l-spine 2.0 cor bone · coronal · 0.38mm/px · 3 of 98 slices shown]
[im 20/98  bone]
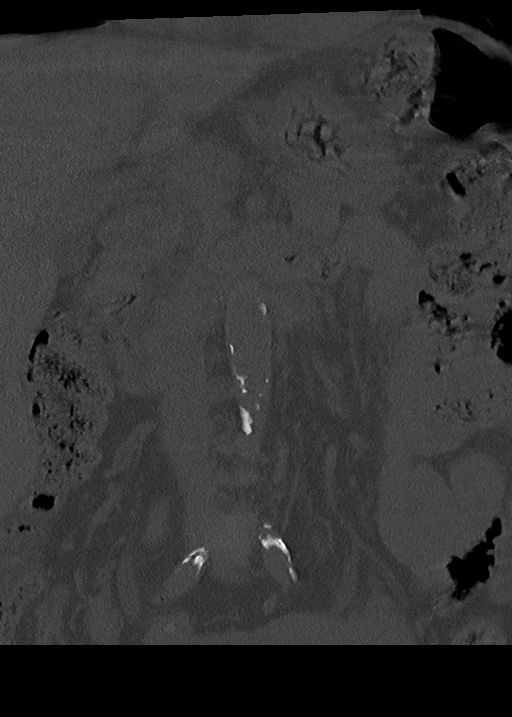
[im 39/98  bone]
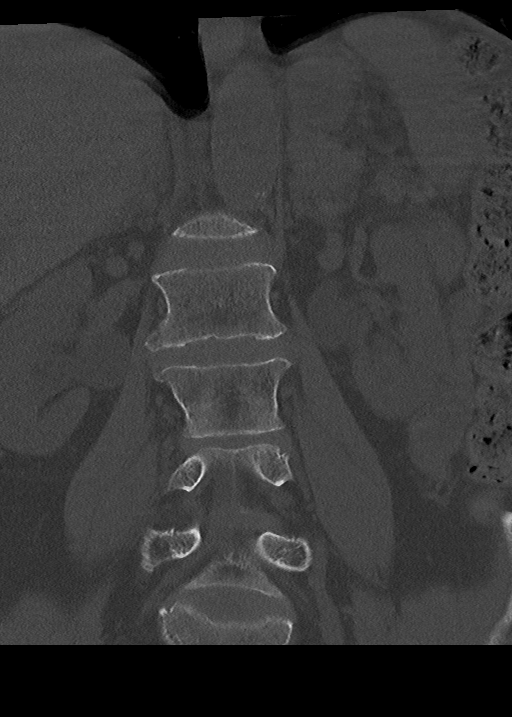
[im 59/98  bone]
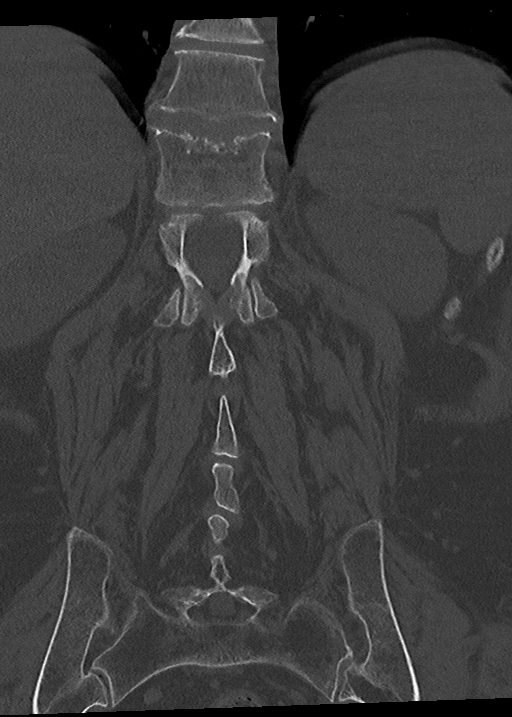

[Series 12: l-spine 2.0 sag bone · sagittal · 0.41mm/px · 5 of 84 slices shown, 6 images]
[im 28/84  bone]
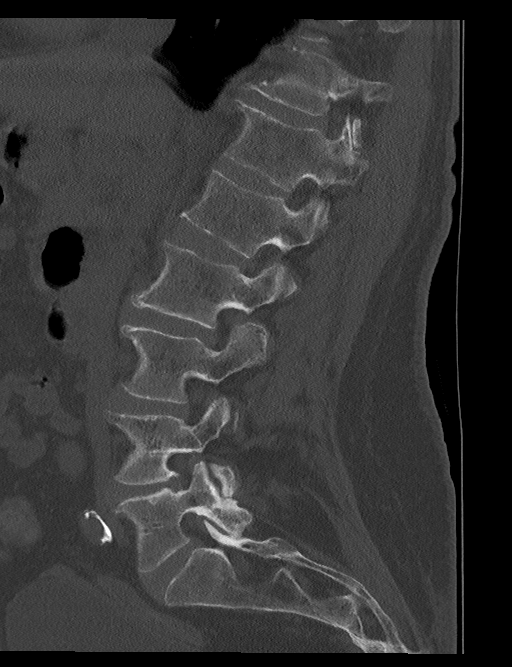
[im 35/84  bone]
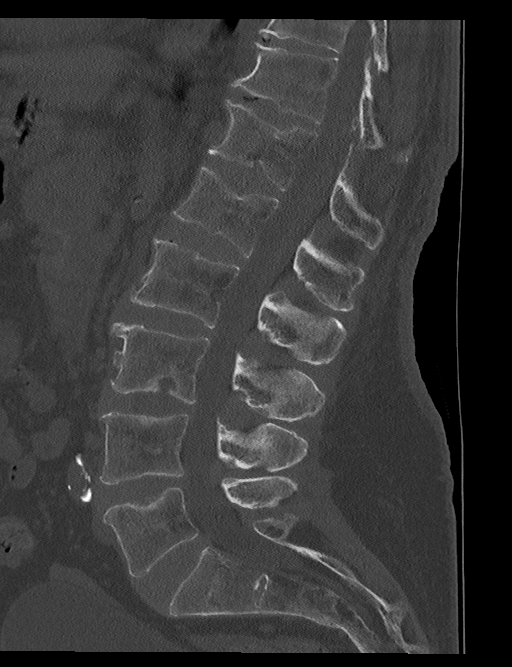
[im 42/84  soft-tissue]
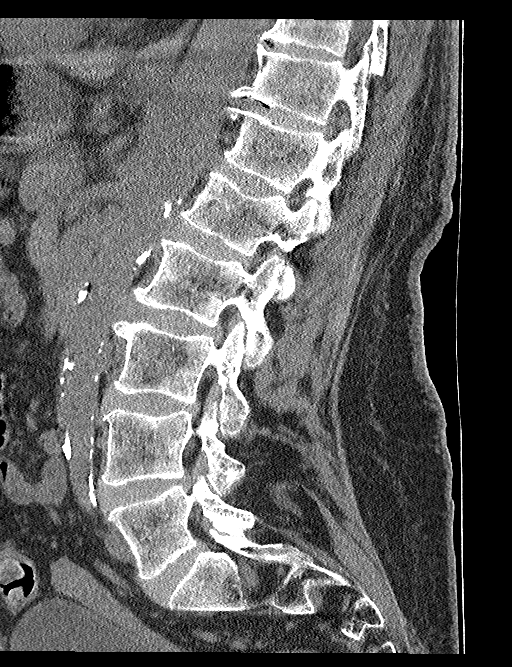
[im 42/84  bone]
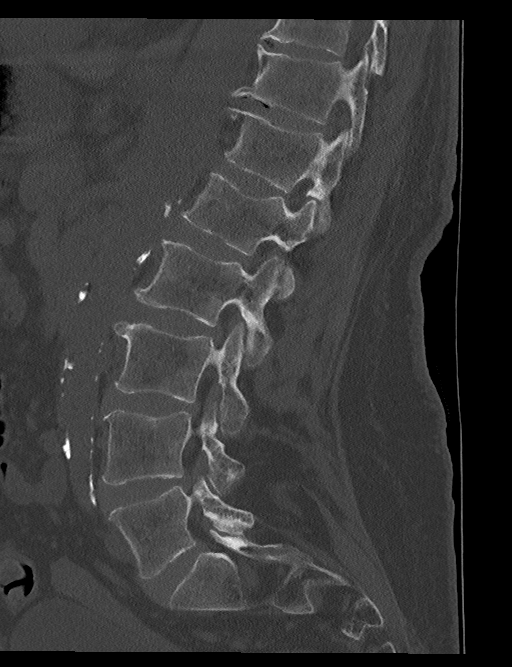
[im 49/84  bone]
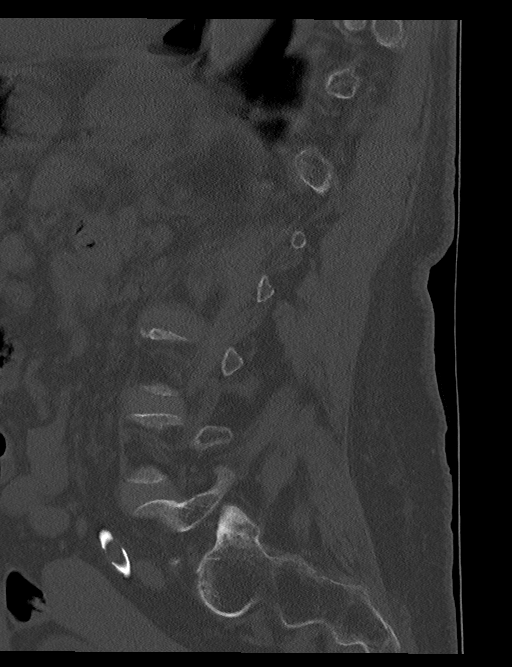
[im 56/84  bone]
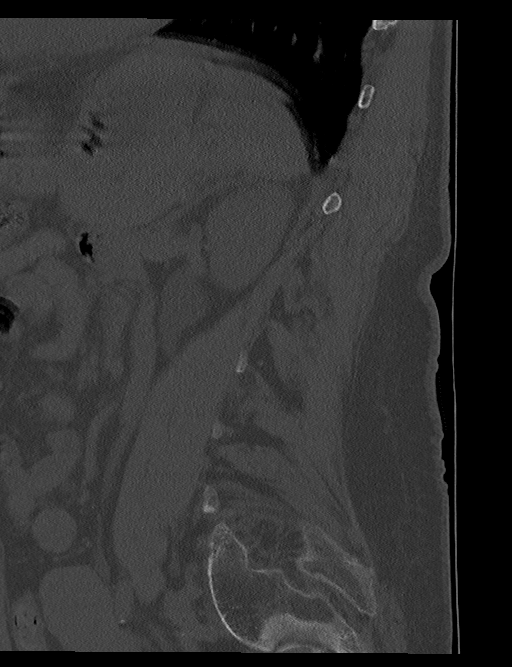

[14 of 33 positions shown; findings below may reference images not displayed]

FINDINGS: Segmentation: 5 lumbar type vertebrae.

Alignment: Preserved lordosis. Anteroposterior alignment is
maintained.

Vertebrae: No acute fracture. Chronic loss of vertebral body height,
primarily at T12 and L1. Scattered Schmorl's nodes.

Paraspinal and other soft tissues: Aortic atherosclerosis.

Disc levels: Multilevel mild disc bulges and facet hypertrophy with
ligamentum flavum thickening. No high-grade stenosis.
IMPRESSION: No acute fracture.

## 2022-02-18 ENCOUNTER — Other Ambulatory Visit: Payer: Self-pay | Admitting: Neurology

## 2022-03-19 ENCOUNTER — Other Ambulatory Visit: Payer: Self-pay | Admitting: Neurology

## 2022-04-23 ENCOUNTER — Ambulatory Visit: Payer: Medicare Other | Admitting: Neurology

## 2022-04-27 ENCOUNTER — Encounter (HOSPITAL_COMMUNITY): Payer: Self-pay | Admitting: Emergency Medicine

## 2022-04-27 ENCOUNTER — Emergency Department (HOSPITAL_COMMUNITY): Payer: Medicare Other

## 2022-04-27 ENCOUNTER — Emergency Department (HOSPITAL_COMMUNITY)
Admission: EM | Admit: 2022-04-27 | Discharge: 2022-04-28 | Disposition: A | Discharge status: Home / Self Care | Payer: Medicare Other | Attending: Emergency Medicine | Admitting: Emergency Medicine

## 2022-04-27 ENCOUNTER — Other Ambulatory Visit: Payer: Self-pay

## 2022-04-27 DIAGNOSIS — I825Y2 Chronic embolism and thrombosis of unspecified deep veins of left proximal lower extremity: Secondary | ICD-10-CM | POA: Diagnosis not present

## 2022-04-27 DIAGNOSIS — M79604 Pain in right leg: Secondary | ICD-10-CM | POA: Diagnosis present

## 2022-04-27 DIAGNOSIS — M79605 Pain in left leg: Secondary | ICD-10-CM

## 2022-04-27 DIAGNOSIS — J449 Chronic obstructive pulmonary disease, unspecified: Secondary | ICD-10-CM | POA: Diagnosis not present

## 2022-04-27 MED ORDER — OXYCODONE-ACETAMINOPHEN 5-325 MG PO TABS
1.0000 | ORAL_TABLET | Freq: Once | ORAL | Status: AC
  Administered 2022-04-28: 1 via ORAL
  Filled 2022-04-27: qty 1

## 2022-04-27 NOTE — ED Triage Notes (Signed)
Pt reports bilateral lower leg pain that radiates up her legs. Hx of DVTs, no thinners. Pt does have good pulses, skin is WNL.

## 2022-04-27 NOTE — ED Provider Triage Note (Addendum)
Emergency Medicine Provider Triage Evaluation Note  [name] , a 66 y.o. female  was evaluated in triage.  Pt complains of bilateral lower extremity pain that started all of a sudden yesterday evening.  States that she was going to hydrate normally and to begin experiences burning and tingling pain in both of her legs, symmetric.  No weakness, no improvement with Tylenol.  History of degenerative disc disease in the lumbar spine.  No saddle anesthesia.  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  Review of Systems  Positive: Bilateral lower extremity pain, dyspnea on exertion Negative: Lower extremity edema, chest pain  Physical Exam  BP 105/67 (BP Location: Right Arm)   Pulse 91   Temp 98.1 F (36.7 C) (Oral)   Resp 19   Ht '5\' 7"'$  (1.702 m)   Wt 59 kg   SpO2 95%   BMI 20.36 kg/m  Gen:   Awake, no distress   Resp:  Normal effort  MSK:   Moves extremities without difficulty  Other:  Bilateral pedal pulses 2+, no calf tenderness to palpation on exam.  No lower extremity edema.  RRR no M/R/G.  Lungs CTA B. Midline lumbar TTP, no skin changes, no recent procedures per patient.  Medical Decision Making  Medically screening exam initiated at 10:49 PM.  Appropriate orders placed.  [name] was informed that the remainder of the evaluation will be completed by another provider, this initial triage assessment does not replace that evaluation, and the importance of remaining in the ED until their evaluation is complete.  This chart was dictated using voice recognition software, Dragon. Despite the best efforts of this provider to proofread and correct errors, errors may still occur which can change documentation meaning.    [name] Darling, PA-C 04/27/22 2305    [name], [name] Balsam, PA-C 04/27/22 2310

## 2022-04-28 ENCOUNTER — Emergency Department (HOSPITAL_COMMUNITY): Payer: Medicare Other

## 2022-04-28 ENCOUNTER — Emergency Department (HOSPITAL_BASED_OUTPATIENT_CLINIC_OR_DEPARTMENT_OTHER): Admit: 2022-04-28 | Discharge: 2022-04-28 | Disposition: A | Discharge status: Home / Self Care | Payer: Medicare Other

## 2022-04-28 ENCOUNTER — Encounter (HOSPITAL_COMMUNITY): Payer: Self-pay | Admitting: Emergency Medicine

## 2022-04-28 DIAGNOSIS — R52 Pain, unspecified: Secondary | ICD-10-CM

## 2022-04-28 LAB — COMPREHENSIVE METABOLIC PANEL
ALT: 13 U/L (ref 0–44)
AST: 18 U/L (ref 15–41)
Albumin: 3.9 g/dL (ref 3.5–5.0)
Alkaline Phosphatase: 52 U/L (ref 38–126)
Anion gap: 12 (ref 5–15)
BUN: 9 mg/dL (ref 8–23)
CO2: 23 mmol/L (ref 22–32)
Calcium: 9.1 mg/dL (ref 8.9–10.3)
Chloride: 105 mmol/L (ref 98–111)
Creatinine, Ser: 0.79 mg/dL (ref 0.44–1.00)
GFR, Estimated: >60 mL/min (ref >60)
Glucose, Bld: 94 mg/dL (ref 70–99)
Potassium: 3.6 mmol/L (ref 3.5–5.1)
Sodium: 140 mmol/L (ref 135–145)
Total Bilirubin: 0.4 mg/dL (ref 0.3–1.2)
Total Protein: 6.6 g/dL (ref 6.5–8.1)

## 2022-04-28 LAB — CBC WITH DIFFERENTIAL/PLATELET
Abs Immature Granulocytes: 0.01 10*3/uL (ref 0.00–0.07)
Basophils Absolute: 0 10*3/uL (ref 0.0–0.1)
Basophils Relative: 0 %
Eosinophils Absolute: 0.1 10*3/uL (ref 0.0–0.5)
Eosinophils Relative: 1 %
HCT: 39.9 % (ref 36.0–46.0)
Hemoglobin: 13 g/dL (ref 12.0–15.0)
Immature Granulocytes: 0 %
Lymphocytes Relative: 32 %
Lymphs Abs: 1.8 10*3/uL (ref 0.7–4.0)
MCH: 34.8 pg — ABNORMAL HIGH (ref 26.0–34.0)
MCHC: 32.6 g/dL (ref 30.0–36.0)
MCV: 106.7 fL — ABNORMAL HIGH (ref 80.0–100.0)
Monocytes Absolute: 0.4 10*3/uL (ref 0.1–1.0)
Monocytes Relative: 6 %
Neutro Abs: 3.5 10*3/uL (ref 1.7–7.7)
Neutrophils Relative %: 61 %
Platelets: 137 10*3/uL — ABNORMAL LOW (ref 150–400)
RBC: 3.74 MIL/uL — ABNORMAL LOW (ref 3.87–5.11)
RDW: 12.6 % (ref 11.5–15.5)
WBC: 5.7 10*3/uL (ref 4.0–10.5)
nRBC: 0 % (ref 0.0–0.2)

## 2022-04-28 LAB — TROPONIN I (HIGH SENSITIVITY)
Troponin I (High Sensitivity): 3 ng/L (ref <18)
Troponin I (High Sensitivity): 2 ng/L (ref <18)

## 2022-04-28 MED ORDER — PREDNISONE 10 MG (21) PO TBPK: ORAL_TABLET | Freq: Every day | ORAL | 0 refills | Status: DC

## 2022-04-28 MED ORDER — FENTANYL CITRATE PF 50 MCG/ML IJ SOSY
12.5000 ug | PREFILLED_SYRINGE | Freq: Once | INTRAMUSCULAR | Status: AC
  Administered 2022-04-28: 12.5 ug via INTRAVENOUS
  Filled 2022-04-28: qty 1

## 2022-04-28 MED ORDER — IOHEXOL 350 MG/ML SOLN
100.0000 mL | Freq: Once | INTRAVENOUS | Status: AC | PRN
  Administered 2022-04-28: 100 mL via INTRAVENOUS

## 2022-04-28 NOTE — ED Notes (Signed)
Returning from vascular study

## 2022-04-28 NOTE — ED Notes (Signed)
Patient called for room x3 

## 2022-04-28 NOTE — Discharge Instructions (Addendum)
Please take steroid taper as prescribed Please follow-up with your doctor this week Return if you are having worsening pain

## 2022-04-28 NOTE — Progress Notes (Signed)
VASCULAR LAB    Bilateral lower extremity venous duplex has been performed.  See CV proc for preliminary results.   Kailei Cowens, RVT 04/28/2022, 8:49 AM

## 2022-04-28 NOTE — ED Notes (Signed)
Attempted IV start unsuccessful

## 2022-04-28 NOTE — ED Provider Notes (Signed)
[facility] EMER[facility]ENCY DEPARTMENT Provider Note   CSN: [id number] Arrival date & time: 04/27/22  2007     History  Chief Complaint  Patient presents with   Leg Pain    [name] is a 66 y.o. female.  HPI 66 year old female history of DVT, degenerative disc disease, seizures, COPD, respiratory failure, nutrition, GERD, neuropathy, atrial flutter, frequent falls, paroxysmal atrial fibrillation, seizures, weakness of lower extremities, presents today complaining of lower extremity pain.  He states that she has had this in the past but today it is worse.  Is been present for several days.  It radiates down to both of her legs but she is not complaining of back pain.  She denies any numbness tingling or weakness.  It is worse in the left than the right.  Has been taking her usual home medications without relief including acetaminophen and gabapentin.  Reports that she has had multiple blood clots in the past but was told that she cannot be on blood thinners.  Review of records note that she is high risk for anticoagulation and she is not anticoagulated by cardiology for A-fib or flutter.    Home Medications Prior to Admission medications   Medication Sig Start Date End Date Taking? Authorizing Provider  LORazepam (ATIVAN) 1 MG tablet TAKE 1 TABLET BY MOUTH AS DIRECTED AS NEEDED FOR 3 OR MORE SEIZURES IN 24 HOURS. DO NOT TAKE MORE THAN 2 TABLETS IN 24 HOURS. 02/19/22   [name], MD  predniSONE (STERAPRED UNI-PAK 21 TAB) 10 MG (21) TBPK tablet Take by mouth daily. Take 6 tabs by mouth daily  for 2 days, then 5 tabs for 2 days, then 4 tabs for 2 days, then 3 tabs for 2 days, 2 tabs for 2 days, then 1 tab by mouth daily for 2 days 04/28/22  Yes Pattricia Boss, MD  acetaminophen (TYLENOL) 500 MG tablet Take 1,000 mg by mouth every 6 (six) hours as needed for headache.    [provider]  cholecalciferol (VITAMIN D3) 25 MCG (1000 UNIT) tablet Take 1 tablet (1,000  Units total) by mouth daily. 04/02/21   [name], MD  [facility] B-12 500 MCG tablet TAKE 2 TABLETS BY MOUTH EVERY DAY Patient not taking: Reported on 10/17/2021 01/24/20   [name], MD  DILANTIN 100 MG ER capsule TAKE 1 CAPSULE BY MOUTH TWICE DAILY 03/20/22   [name], MD  DILANTIN 30 MG ER capsule TAKE 1 CAPSULE BY MOUTH NIGHTLY ALONG WITH 100 MG CAPSULE FOR A TOTAL NIGHTLY DOSE OF '130MG'$  03/20/22   [name], MD  escitalopram (LEXAPRO) 10 MG tablet TAKE 1 TABLET BY MOUTH AT BEDTIME 03/20/22   [name], MD  folic acid (FOLVITE) 1 MG tablet Take 1 tablet (1 mg total) by mouth daily. Patient not taking: Reported on 10/17/2021 02/14/21   Raiford Noble Latif, DO  gabapentin (NEURONTIN) 300 MG capsule Take 1 capsule every night 05/31/21   [name], MD  VIMPAT 200 MG TABS tablet TAKE 1 TABLET BY MOUTH TWICE DAILY 12/03/21   [name], MD  XCOPRI 100 MG TABS TAKE 1 TABLET BY MOUTH DAILY 12/03/21   [name], MD      Allergies    Zonisamide, Chocolate flavor, Codeine, Keppra [levetiracetam], Pregabalin, and Olive oil    Review of Systems   Review of Systems  Physical Exam Updated Vital Signs BP (!) 105/59   Pulse 64  Temp 98.2 F (36.8 C) (Oral)   Resp 14   Ht 1.702 m ('5\' 7"'$ )   Wt 59 kg   SpO2 97%   BMI 20.36 kg/m  Physical Exam Vitals and nursing note reviewed.  Constitutional:      Appearance: Normal appearance.  HENT:     Head: Normocephalic.     Right Ear: External ear normal.     Left Ear: External ear normal.     Nose: Nose normal.     Mouth/Throat:     Pharynx: Oropharynx is clear.  Eyes:     Pupils: Pupils are equal, round, and reactive to light.  Cardiovascular:     Rate and Rhythm: Normal rate and regular rhythm.  Pulmonary:     Effort: Pulmonary effort is normal.     Breath sounds: Decreased air movement present.     Comments: Air movement is decreased at bilateral bases Abdominal:     General: Abdomen is flat. Bowel sounds are  normal.     Palpations: Abdomen is soft.     Tenderness: There is no abdominal tenderness.  Musculoskeletal:        General: No tenderness. Normal range of motion.     Cervical back: Normal range of motion.  Skin:    General: Skin is warm and dry.     Capillary Refill: Capillary refill takes less than 2 seconds.     Comments: Lower extremities reveal no obvious signs of trauma No cords are palpated She is nontender Bilateral lower extremities are somewhat paler than upper extremities Pulses are intact  Neurological:     General: No focal deficit present.     Mental Status: She is alert.  Psychiatric:        Mood and Affect: Mood normal.        Behavior: Behavior normal.     ED Results / Procedures / Treatments   Labs (all labs ordered are listed, but only abnormal results are displayed) Labs Reviewed  CBC WITH DIFFERENTIAL/PLATELET - Abnormal; Notable for the following components:      Result Value   RBC 3.74 (*)    MCV 106.7 (*)    MCH 34.8 (*)    Platelets 137 (*)    All other components within normal limits  COMPREHENSIVE METABOLIC PANEL  TROPONIN I (HIGH SENSITIVITY)  TROPONIN I (HIGH SENSITIVITY)    EKG EKG Interpretation  Date/Time:  Sunday April 27 2022 23:13:28 EDT Ventricular Rate:  90 PR Interval:  154 QRS Duration: 76 QT Interval:  356 QTC Calculation: 435 R Axis:   83 Text Interpretation: Normal sinus rhythm Normal ECG When compared with ECG of 11-Feb-2021 09:02, No significant change since last tracing Confirmed by Aletta Edouard [phone]) on 04/28/2022 12:14:16 PM  Radiology CT ANGIO AO+BIFEM W & OR WO CONTRAST  Result Date: 04/28/2022 CLINICAL DATA:  66 year old female with a history lower extremity pain EXAM: CT ANGIOGRAPHY OF ABDOMINAL AORTA WITH ILIOFEMORAL RUNOFF TECHNIQUE: Multidetector CT imaging of the abdomen, pelvis and lower extremities was performed using the standard protocol during bolus administration of intravenous contrast.  Multiplanar CT image reconstructions and MIPs were obtained to evaluate the vascular anatomy. RADIATION DOSE REDUCTION: This exam was performed according to the departmental dose-optimization program which includes automated exposure control, adjustment of the mA and/or kV according to patient size and/or use of iterative reconstruction technique. CONTRAST:  139m OMNIPAQUE IOHEXOL 350 MG/ML SOLN COMPARISON:  08/02/2020 FINDINGS: VASCULAR Aorta: Unremarkable course, caliber, contour of the abdominal aorta. No  dissection, aneurysm, or periaortic fluid. Mild atherosclerotic changes. Celiac: Patent, with no significant atherosclerotic changes. SMA: Patent, with no significant atherosclerotic changes. Renals: - Right: Right renal artery patent. - Left: Left renal artery patent. IMA: IMA appears occluded at the origin. Right lower extremity: Unremarkable course, caliber, and contour of the right iliac system. Mild atherosclerosis of the right iliac system. No aneurysm, dissection, or occlusion. Hypogastric artery is patent. Common femoral artery patent.  Minimal atherosclerosis. Profunda femoris and the thigh branches patent. SFA patent.  Popliteal artery patent. Typical appearance of the trifurcation. Contrast present throughout the anterior tibial artery, posterior tibial artery, and peroneal artery to the ankle. Left lower extremity: Unremarkable course, caliber, and contour of the left iliac system. Mild atherosclerosis of the left iliac system. No aneurysm, dissection, or occlusion. Hypogastric artery is patent. Common femoral artery patent.  Minimal atherosclerosis. Profunda femoris and the thigh branches patent. Superficial femoral artery patent throughout its length without significant atherosclerosis. Popliteal artery patent. Typical appearance of the trifurcation. Contrast present throughout the length of anterior tibial artery, posterior tibial artery, and the peroneal artery at the ankle. Veins: IVC filter in  place. Otherwise, note that the venous system is not well evaluated secondary to the timing of the contrast bolus. 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 Review of the MIP images confirms the above findings. NON-VASCULAR Lower chest: No acute finding of the lung base. Hepatobiliary: Unchanged appearance of the liver. Minimal cholelithiasis with no inflammatory changes. Pancreas: Unremarkable Spleen: Unremarkable Adrenals/Urinary Tract: - Right adrenal gland: Unremarkable - Left adrenal gland: Unremarkable. - Right kidney: No hydronephrosis, nephrolithiasis, inflammation, or ureteral dilation. No focal lesion. - Left Kidney: Interval resolution of the perfusion defect in the left kidney, with unremarkable appearance of the cortex. No hydronephrosis or nephrolithiasis. No perinephric stranding. Unremarkable left ureter. No focal lesion. - Urinary Bladder: Unremarkable. Stomach/Bowel: - Stomach: Unremarkable. 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 Otherwise unremarkable small bowel. - Appendix: Normal. - Colon: Moderate stool burden. No evidence of obstruction. No inflammatory changes. Lymphatic: No adenopathy. Mesenteric: No free fluid or air. No mesenteric adenopathy. Reproductive: Unremarkable uterus/adnexa Other: No hernia. Musculoskeletal: Degenerative changes of the spine. No displaced fracture. No bony canal narrowing. IMPRESSION: CT angiogram negative for acute arterial occlusion. Mild aortic atherosclerosis and associated bilateral lower extremity disease, with no high-grade stenosis or occlusion of aorta, iliac arteries, femoropopliteal segment, or tibial arteries. Aortic Atherosclerosis (ICD10-I70.0). Additional ancillary findings as above. Signed, Dulcy Fanny. Nadene Rubins, RPVI [facility] Radiology Electronically Signed   By: Corrie Mckusick D.O.   On: 04/28/2022 13:55   CT L-SPINE NO  CHARGE  Result Date: 04/28/2022 CLINICAL DATA:  Low back pain, increased fracture risk EXAM: CT LUMBAR SPINE WITHOUT CONTRAST TECHNIQUE: Multidetector CT imaging of the lumbar spine was performed without intravenous contrast administration. Multiplanar CT image reconstructions were also generated. RADIATION DOSE REDUCTION: This exam was performed according to the departmental dose-optimization program which includes automated exposure control, adjustment of the mA and/or kV according to patient size and/or use of iterative reconstruction technique. COMPARISON:  CT 08/02/2020 FINDINGS: Segmentation: 5 lumbar type vertebrae. Alignment: Trace degenerative retrolisthesis at L4-L5. Vertebrae: There is no acute lumbar spine fracture. Unchanged mild physiologic wedging at T11, T12, and L1 and posterior wedging at L5. Paraspinal and other soft tissues: Reported separately on CT abdomen and pelvis. IVC filter in place. Aortoiliac atherosclerosis. Disc levels: There is mild to moderate multilevel degenerative disc disease,  worst at L4-L5. There is mild to moderate lower lumbar predominant facet arthropathy. There is mild-to-moderate lateral recess and neural foraminal stenosis bilaterally at L4-L5. Mild spinal canal narrowing at L3-L4 due to disc bulging ligamentum flavum hypertrophy. IMPRESSION: No acute lumbar spine fracture. Multilevel degenerative disc disease and facet arthropathy, worst at L4-L5, with mild-to-moderate lateral recess and neural foraminal stenosis bilaterally at this level. Mild spinal canal stenosis at L3-L4. Electronically Signed   By: Maurine Simmering M.D.   On: 04/28/2022 13:50   LE VENOUS  Result Date: 04/28/2022  Lower Venous DVT Study Patient Name:  [name]  Date of Exam:   04/28/2022 Medical Rec #: [id number]       Accession #:    [id number] Date of Birth: 06-24-1956       Patient Gender: F Patient Age:   41 years Exam Location:  [facility] Procedure:      VAS [place] LOWER EXTREMITY  VENOUS (DVT) Referring Phys: [facility] SPONSELLER --------------------------------------------------------------------------------  Indications: Pain, and Per patient, "feet and toes drawing up with pain.".  Risk Factors: DVT 10/10/2020 indicating DVT throughout the left lower extremity, IVC filter placed. Anticoagulation: Unable to have anticoagulation secondary to history of brain bleed/trauma. Comparison Study: Prior study done 10/10/20 Performing Technologist: Sharion Dove RVS  Examination Guidelines: A complete evaluation includes B-mode imaging, spectral Doppler, color Doppler, and power Doppler as needed of all accessible portions of each vessel. Bilateral testing is considered an integral part of a complete examination. Limited examinations for reoccurring indications may be performed as noted. The reflux portion of the exam is performed with the patient in reverse Trendelenburg.  +---------+---------------+---------+-----------+----------+--------------+ RIGHT    CompressibilityPhasicitySpontaneityPropertiesThrombus Aging +---------+---------------+---------+-----------+----------+--------------+ CFV      Full           Yes      Yes                                 +---------+---------------+---------+-----------+----------+--------------+ SFJ      Full                                                        +---------+---------------+---------+-----------+----------+--------------+ FV Prox  Full                                                        +---------+---------------+---------+-----------+----------+--------------+ FV Mid   Full                                                        +---------+---------------+---------+-----------+----------+--------------+ FV DistalFull                                                        +---------+---------------+---------+-----------+----------+--------------+ PFV      Full                                                         +---------+---------------+---------+-----------+----------+--------------+  POP      Full           Yes      Yes                                 +---------+---------------+---------+-----------+----------+--------------+ PTV      Full                                                        +---------+---------------+---------+-----------+----------+--------------+ PERO     Full                                                        +---------+---------------+---------+-----------+----------+--------------+   +---------+---------------+---------+-----------+----------+--------------+ LEFT     CompressibilityPhasicitySpontaneityPropertiesThrombus Aging +---------+---------------+---------+-----------+----------+--------------+ CFV      Full           Yes      Yes                  Chronic        +---------+---------------+---------+-----------+----------+--------------+ SFJ      Full                                                        +---------+---------------+---------+-----------+----------+--------------+ FV Prox  Full                                                        +---------+---------------+---------+-----------+----------+--------------+ FV Mid   Partial                                      Chronic        +---------+---------------+---------+-----------+----------+--------------+ FV DistalNone                                         Chronic        +---------+---------------+---------+-----------+----------+--------------+ PFV      Full                                         Chronic        +---------+---------------+---------+-----------+----------+--------------+ POP      Full           Yes      Yes                                 +---------+---------------+---------+-----------+----------+--------------+ PTV      Full                                                         +---------+---------------+---------+-----------+----------+--------------+  PERO     Partial                                      Chronic        +---------+---------------+---------+-----------+----------+--------------+    Summary: RIGHT: - There is no evidence of deep vein thrombosis in the lower extremity.  LEFT: - Findings consistent with chronic deep vein thrombosis involving the left femoral vein, left peroneal veins, and left common femoral vein.  *See table(s) above for measurements and observations.    Preliminary    DG Chest 2 View  Result Date: 04/27/2022 CLINICAL DATA:  Bilateral lower extremity pain that started all of a sudden yesterday. EXAM: CHEST - 2 VIEW COMPARISON:  Chest radiograph dated October 13, 2020 FINDINGS: The heart size and mediastinal contours are within normal limits. Atherosclerotic calcification of the aortic arch. Hyperinflated lungs with increase in AP diameter of the chest suggesting chronic obstructive pulmonary disease. Diffuse osteopenia and mild thoracic kyphosis. Degenerate disc disease of the thoracic spine. IMPRESSION: 1. Chronic obstructive pulmonary disease without evidence of acute cardiopulmonary process. 2.  Thoracic kyphosis with spondylosis and osteopenia. Electronically Signed   By: Keane Police D.O.   On: 04/27/2022 23:33    Procedures Procedures    Medications Ordered in ED Medications  oxyCODONE-acetaminophen (PERCOCET/ROXICET) 5-325 MG per tablet 1 tablet (1 tablet Oral Given 04/28/22 0512)  fentaNYL (SUBLIMAZE) injection 12.5 mcg (12.5 mcg Intravenous Given 04/28/22 1101)  iohexol (OMNIPAQUE) 350 MG/ML injection 100 mL (100 mLs Intravenous Contrast Given 04/28/22 1329)    ED Course/ Medical Decision Making/ A&P Clinical Course as of 04/28/22 1445  Mon Apr 28, 2022  1235 CT ANGIO AO+BIFEM W & OR WO CONTRAST [DR]  1354 CT lumbar spine reviewed interpretation without acute fracture Radiologist interpretation notes multilevel degenerative  disc disease and facet arthropathy with mild to moderate lateral recess and neuroforaminal foraminal stenosis bilaterally at L4-L5 with symptoms spinal canal stenosis at L3-L4 [DR]  75 CT ANGIO AO+BIFEM W & OR WO CONTRAST [DR]    Clinical Course User Index [DR] Pattricia Boss, MD                           Medical Decision Making 66 year old female presents today complaining of bilateral lower extremity pain with left worse than right.  She has not had any recent trauma.  She is not having any significant back pain. Patient is a smoker Differential diagnosis includes but is not limited to 1 DVT patient with known history of prior DVT and is unable to take blood thinners, arterial insufficiency or ischemia, patient has pulses here in ED but is being evaluated with CTA. Etiology from back Infection Patient evaluated with ultrasound and left lower extremity does have changes consistent with chronic DVT Patient has pulses intact, CTA obtained and does not show any evidence of critical stenosis CT was obtained of the lumbar spine and shows some DJD and spinal stenosis  Physical exam shows sensation intact with no evidence of acute neurological defects I do not feel that MRI is indicated at this time Patient was treated here with oxycodone and fentaynyl Patient has some improvement of pain Discussed need for f/u and return precautions  Plan steroid taper- patient with many allergies and on gabapentin. 1- leg pain 2- chronic dvt left leg- contraindicated for blood thinner   Amount and/or Complexity of Data  Reviewed External Data Reviewed: notes. Labs: ordered. Decision-making details documented in ED Course. Radiology: ordered and independent interpretation performed. Decision-making details documented in ED Course.  Risk Prescription drug management.           Final Clinical Impression(s) / ED Diagnoses Final diagnoses:  Pain in both lower extremities  Chronic deep vein  thrombosis (DVT) of proximal vein of left lower extremity ([place])    Rx / DC Orders ED Discharge Orders          Ordered    predniSONE (STERAPRED UNI-PAK 21 TAB) 10 MG (21) TBPK tablet  Daily        04/28/22 1444    LE VENOUS        04/27/22 2303              Pattricia Boss, MD 04/28/22 1445

## 2022-05-09 ENCOUNTER — Ambulatory Visit: Payer: Medicare Other | Admitting: Neurology

## 2022-05-13 IMAGING — DX DG CHEST 1V PORT
2 series · 2 of 2 positions shown · non-contrast
Comparison: 08/26/2019

CLINICAL DATA: Hypoxia

EXAM:
PORTABLE CHEST 1 VIEW

[chest ap (1 of 2)]
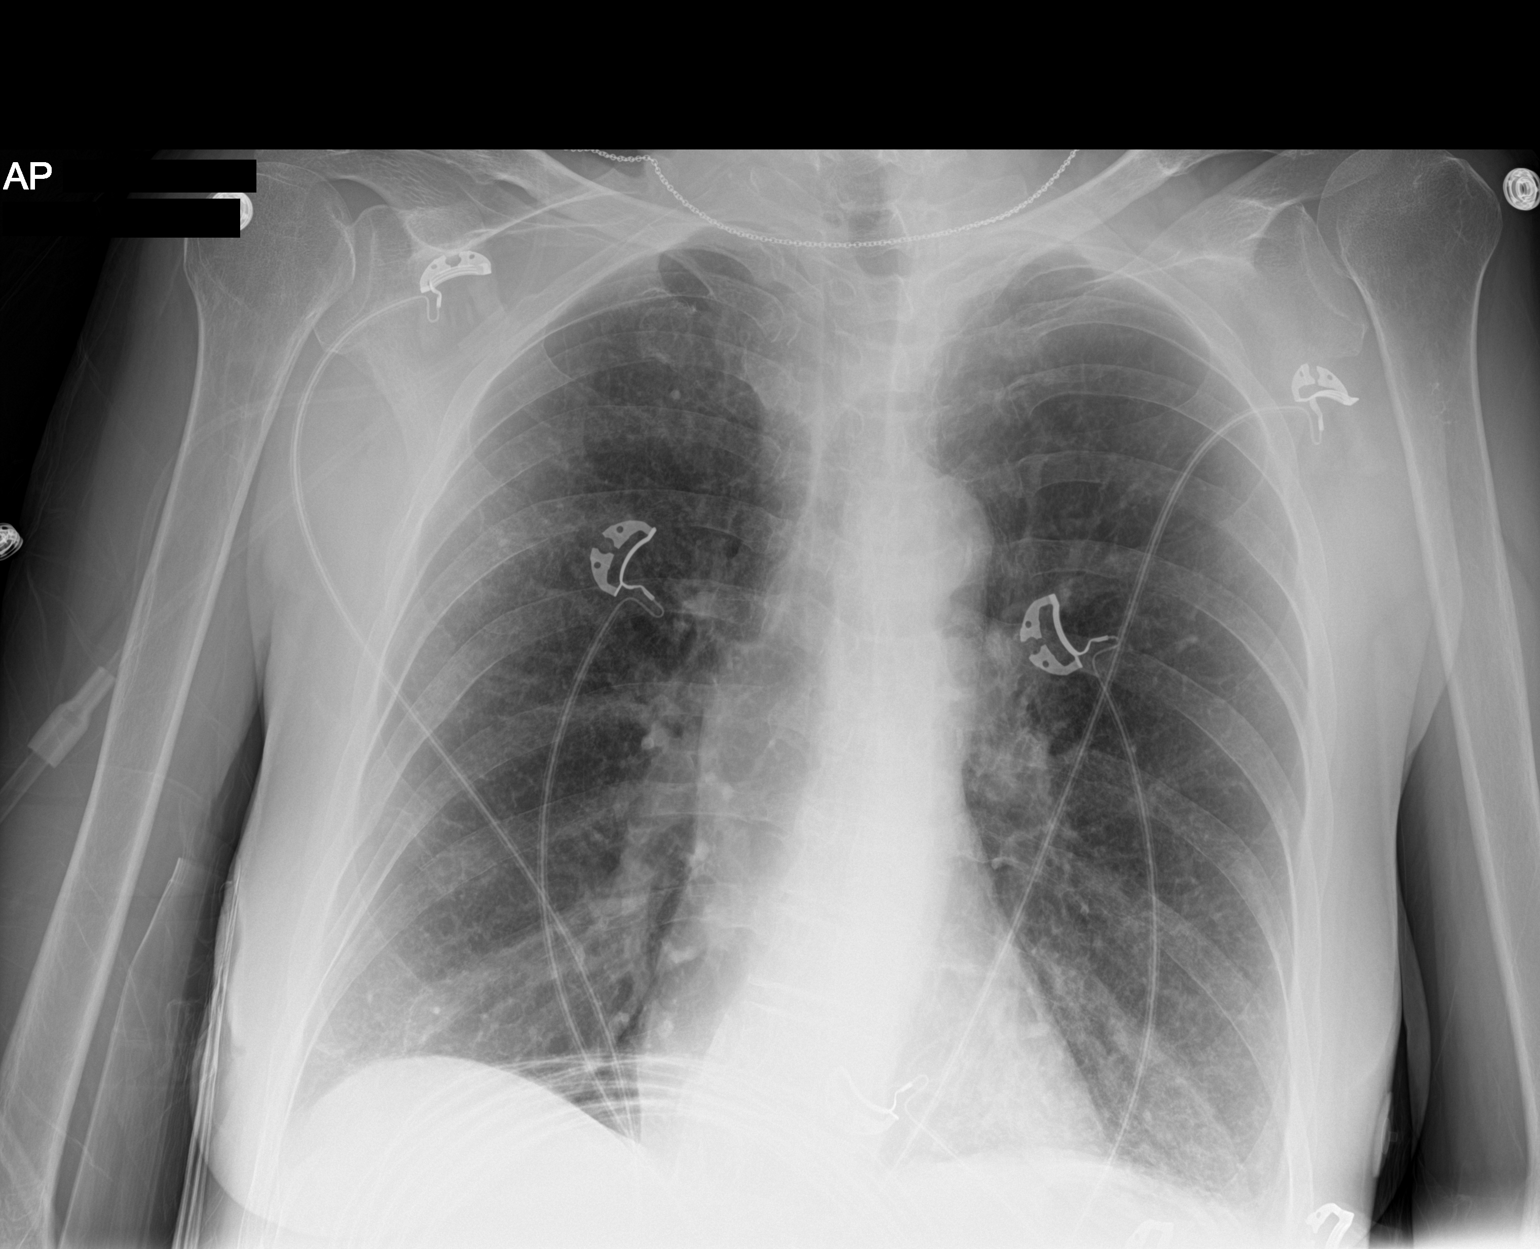

[chest ap (2 of 2)]
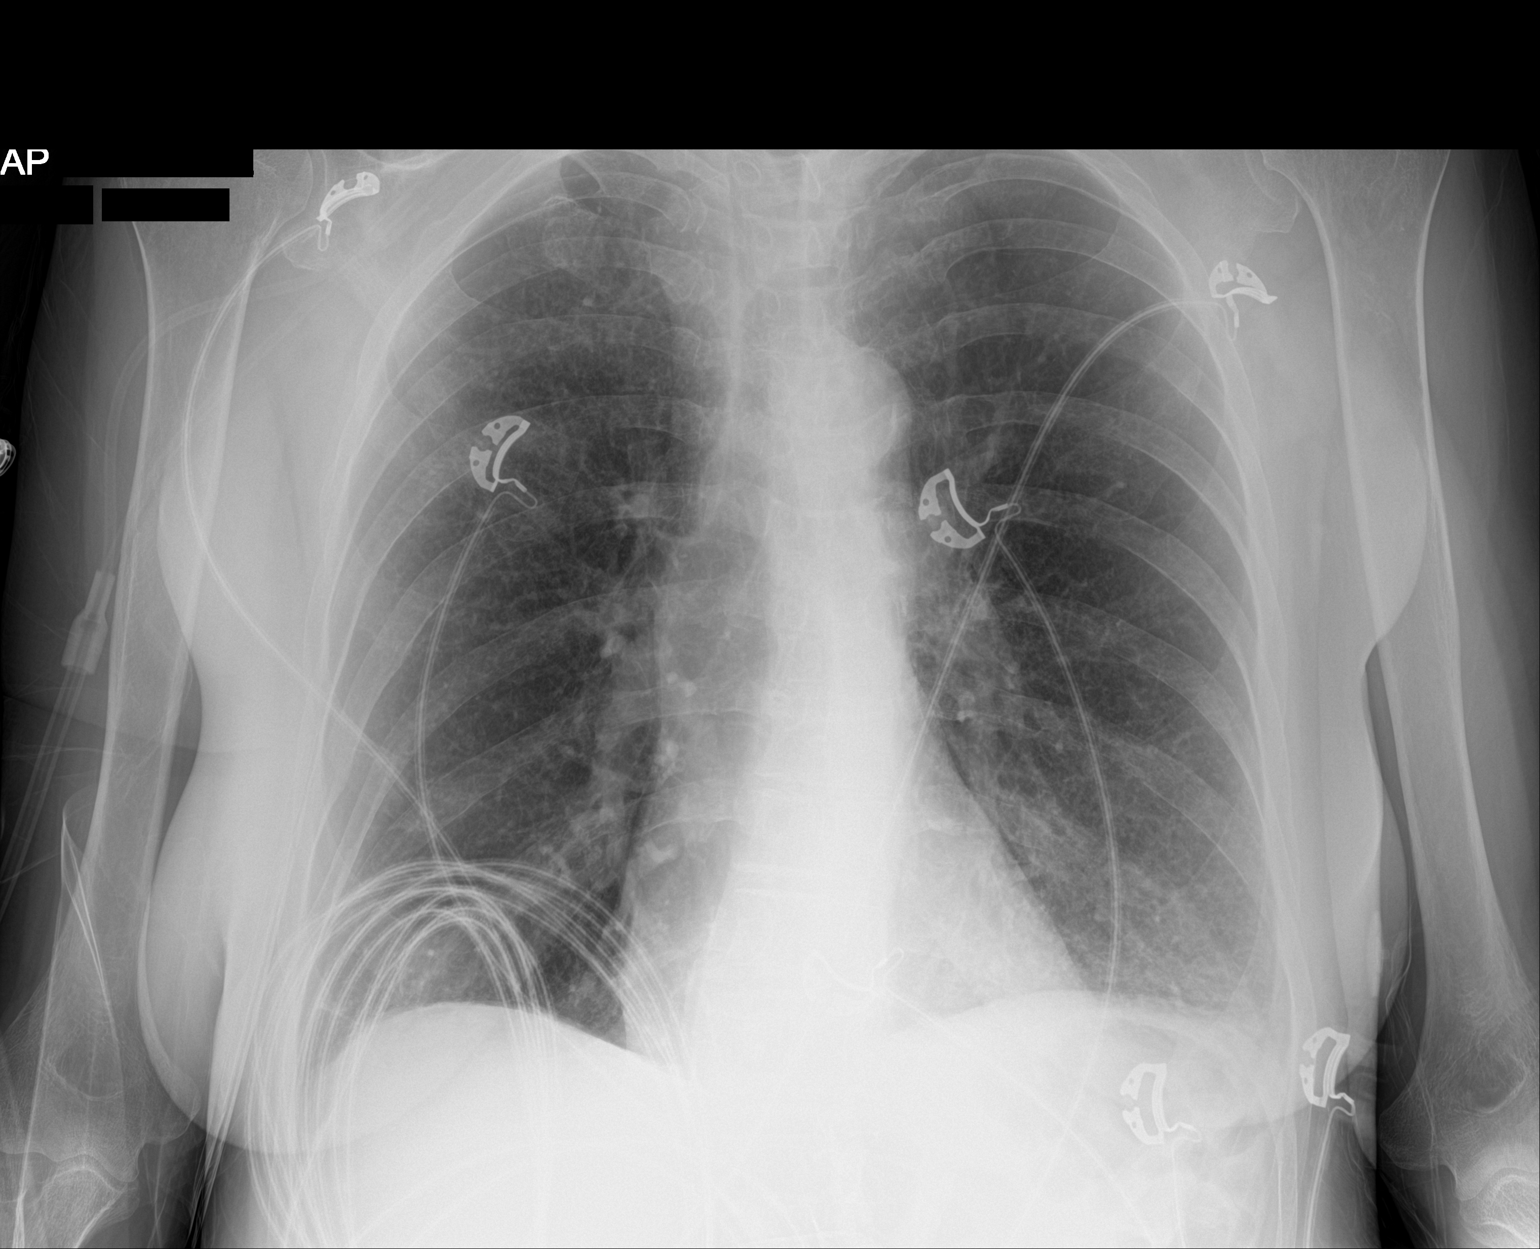

[2 of 2 positions shown; findings below may reference images not displayed]

FINDINGS: The heart size and mediastinal contours are within normal limits.
Both lungs are clear. The visualized skeletal structures are
unremarkable.
IMPRESSION: No active disease.

## 2022-05-19 ENCOUNTER — Other Ambulatory Visit: Payer: Self-pay | Admitting: Neurology

## 2022-05-20 ENCOUNTER — Ambulatory Visit: Payer: Medicare Other | Admitting: Neurology

## 2022-05-20 ENCOUNTER — Telehealth: Payer: Self-pay | Admitting: Neurology

## 2022-05-20 ENCOUNTER — Encounter: Payer: Self-pay | Admitting: Neurology

## 2022-05-20 VITALS — BP 136/76 | HR 109 | Ht 67.0 in | Wt 144.4 lb

## 2022-05-20 DIAGNOSIS — G40219 Localization-related (focal) (partial) symptomatic epilepsy and epileptic syndromes with complex partial seizures, intractable, without status epilepticus: Secondary | ICD-10-CM | POA: Diagnosis not present

## 2022-05-20 DIAGNOSIS — M5489 Other dorsalgia: Secondary | ICD-10-CM | POA: Diagnosis not present

## 2022-05-20 DIAGNOSIS — G629 Polyneuropathy, unspecified: Secondary | ICD-10-CM | POA: Diagnosis not present

## 2022-05-20 DIAGNOSIS — M5136 Other intervertebral disc degeneration, lumbar region: Secondary | ICD-10-CM

## 2022-05-20 MED ORDER — CYANOCOBALAMIN 500 MCG PO TABS: 1000.0000 ug | ORAL_TABLET | Freq: Every day | ORAL | 3 refills | Status: DC

## 2022-05-20 MED ORDER — XCOPRI 150 MG PO TABS: ORAL_TABLET | ORAL | 3 refills | Status: DC

## 2022-05-20 MED ORDER — VIMPAT 200 MG PO TABS: 200.0000 mg | ORAL_TABLET | Freq: Two times a day (BID) | ORAL | 5 refills | Status: DC

## 2022-05-20 MED ORDER — FOLIC ACID 1 MG PO TABS: 1.0000 mg | ORAL_TABLET | Freq: Every day | ORAL | 3 refills | Status: DC

## 2022-05-20 MED ORDER — DILANTIN 100 MG PO CAPS: ORAL_CAPSULE | ORAL | 3 refills | Status: DC

## 2022-05-20 MED ORDER — GABAPENTIN 300 MG PO CAPS: ORAL_CAPSULE | ORAL | 3 refills | Status: DC

## 2022-05-20 MED ORDER — DILANTIN 30 MG PO CAPS: ORAL_CAPSULE | ORAL | 3 refills | Status: DC

## 2022-05-20 NOTE — Progress Notes (Signed)
Legs are hurting, started 3 months ago, takes gabapentin stated has neuropathy

## 2022-05-20 NOTE — Patient Instructions (Signed)
Always good to see you.  Increase Gabapentin '300mg'$ : take 2 capsules every night  2. Increase Xcopri to '150mg'$ : take 1 tablet every night  3. Continue Dilantin '100mg'$  in AM, 12min PM and Vimpat '200mg'$  twice a day  4. Referral will be sent to Ortho for back pain and leg pain  5. Follow-up in 6 months, call for any changes   Seizure Precautions: 1. If medication has been prescribed for you to prevent seizures, take it exactly as directed.  Do not stop taking the medicine without talking to your doctor first, even if you have not had a seizure in a long time.   2. Avoid activities in which a seizure would cause danger to yourself or to others.  Don't operate dangerous machinery, swim alone, or climb in high or dangerous places, such as on ladders, roofs, or girders.  Do not drive unless your doctor says you may.  3. If you have any warning that you may have a seizure, lay down in a safe place where you can't hurt yourself.    4.  No driving for 6 months from last seizure, as [facility]   Please refer to the following link on the [place] for more information: http://www.epilepsyfoundation.org/answerplace/Social/driving/drivingu.cfm   5.  Maintain good sleep hygiene. Avoid alcohol.  6.  Contact your doctor if you have any problems that may be related to the medicine you are taking.  7.  Call 911 and bring the patient back to the ED if:        A.  The seizure lasts longer than 5 minutes.       B.  The patient doesn't awaken shortly after the seizure  C.  The patient has new problems such as difficulty seeing, speaking or moving  D.  The patient was injured during the seizure  E.  The patient has a temperature over 102 F (39C)  F.  The patient vomited and now is having trouble breathing

## 2022-05-20 NOTE — Telephone Encounter (Signed)
Daughter does not want [place] for her mom to be referred to, they just called her and she declined.  She'd like her sent to:  [facility]  Or   [facility]

## 2022-05-20 NOTE — Telephone Encounter (Signed)
Referral sent to [facility]

## 2022-05-20 NOTE — Progress Notes (Signed)
NEUROLOGY FOLLOW UP OFFICE NOTE  [name] [id number] 02/22/1956  HISTORY OF PRESENT ILLNESS: I had the pleasure of seeing [name] in follow-up in the neurology clinic on 05/20/2022.  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 She is again accompanied by her daughter [name] who helps supplement the history today.  Records and images were personally reviewed where available.  Since her last visit, [name] reports that she had overall been doing well until recently when she started having around 2 seizures a month, she had 2 GTCs 3 weeks ago and was given rescue lorazepam. She had another GTC last week lasting 30-45 seconds. She is on Cenobamate '100mg'$  daily, Dilantin '100mg'$  in AM, '130mg'$  in PM, and Lacosamide '200mg'$  BID without side effects. Her back is always bothering her, her feet are swollen. She is on Gabapentin '300mg'$  qhs for neuropathy.   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  CT L-spine in 04/2022 showed multilevel degenerative disc disease and facet arthropathy, worst at L4-5, with mild to moderate lateral recess and neural foraminal stenosis bilaterally at this level, mild spinal canal stenosis at L3-4.   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 She had been seeing epileptologist [name], records were reviewed and will be summarized as follows. In the past, seizures would start with headache and burning sensation of the left arm, which may be followed by left arm jerking. Over time, she has not had any prior warning symptoms. She has had seizures where she is noted to have kicking and rocking movements of the  pelvis with unresponsiveness. She was in the EMU in 10/2012 where 11 seizures were captured from the right posterior hemisphere/right inferior temporal region. Interictal EEG showed abundant right posterior temporal epileptiform discharges, frequent right anterior temporal epileptiform discharges, as well as occasional independent left anterior temporal epileptiform discharges. There was focal slowing over the right posterior hemisphere. MRI brain in 2014 showed right parietal/occipital encephalomalacia, right hippocampus smaller than left. PET scan in 2015 showed multiple areas of hypometabolism on the right, mostly corresponding to or adjacent to the structural abnormality. Ictal SPECT showed right hemisphere hypoperfusion. SISCOM showed right frontal region significant on the subtraction. MEG in 07/2013 showed spikes localizing to the right occipital, parietal, mesial temporal regions with a weak signal left mesial parietal. Concomitant EEG showed right posterior spikes. Functional mapping suggestive of left language lateralization. She has tried multiple AEDs in the past with various side effects or ineffective, and has been taking Dilantin '160mg'$  in AM, '200mg'$  in PM and Vimpat '200mg'$  BID with seizures around once a month. She was admitted to [facility] on 03/19/18 for left leg weakness and drowsiness. She was fishing with her boyfriend when she suddenly could not move her left leg and almost fell. She states that her boyfriend told her that she was shaking and had a seizure while they were fishing. She was brought to [facility] where Dilantin level was 33.8. She was adamant that she has been taking her medications as instructed and did not take more than prescribed, no new medications taken. She had an MRI brain without contrast which I personally reviewed, no acute changes seen. Large volume right parieto-occipital encephalomalacia was  unchanged. Right hippocampus is smaller. She continued to report weakness and had an MRI  cervical and lumbar spine which did not show any myelopathy, there was prominent left-sided facet hypertrophy at C4-5 with resultant moderate left C5 foraminal stenosis, degenerative disc osteophyte at C5-6 with severe right C6 foraminal stenosis, and mild spinal stenosis at C5-6 and C6-7, chronic borderline to mild multifactorial spinal stenosis at L2-3 through L4-5 with no convincing lateral recess or foraminal stenosis. She continued to report burning pain in her legs and was started on low dose gabapentin '300mg'$  qhs with no side effects. Dilantin was held in the hospital, she was discharged on a lower dose, but states that she has been taking her pre-hospital dose of '160mg'$  in AM, '200mg'$  in PM but has had 4 seizures since hospital discharge 2 weeks ago, which is unusual. She had 2 seizures in one day in the car with her mother-in-law, her mother-in-law had to pull over. The next day she had 2 at home, her daughter found her in the bathroom. She has not been sleeping well the past few days. She has a headache after the seizures. Aside from the convulsions, she has episodes where a feeling comes over her left side, from the arm down her leg, then she is weaker after. She denies any jerking. She is quite anxious in the office today and perseverates on her symptoms. She states she "stays nervous." She reports taking prn Ativan in the past, but discontinued at [facility]. She repeatedly states her feet are numb and they "feel like they are grabbed." She feels gabapentin has helped some with this.    Prior AEDs: phenobarbital (ineffective, mood issues), Depakote (multiple trials, ineffective), Keppra (ineffective), Zonisamide (side effects on brief trial of higher dose than prescribed - '500mg'$  instead of '200mg'$ ), Onfi (mouth sores), Tegretol (ineffective), Lamictal (nausea, dizziness, malaise), Felbatol (nausea, dizziness, malaise), Topamax (ineffective), Tigabine (tried per notes, details unclear), Neurontin (ineffective,  nausea), Lyrica (leg pain reported on '25mg'$  daily, tingling), Briviact (drowsiness), Fycompa   Epilepsy Risk Factors:  Right parieto-occipital AVM s/p rupture and hemorrhage in 1988, s/p craniectomy. Otherwise she had a normal birth and early development.  There is no history of febrile convulsions, CNS infections such as meningitis/encephalitis, or family history of seizures.  Diagnostic Data:  MRI brain without contrast 05/2018 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  EEG done 06/2018 showed right posterior temporal slowing, occasional epileptiform discharges over the right posterior temporal region, breach artifact in this region.   MRI cervical, thoracic, and lumbar spine done 07/2018 showed no convincing cervical or thoracic spinal cord abnormality. Mild degenerative cervical spinal stenosis at C5-6 and C6-7 with moderate or severe bilateral foraminal stenosis; no thoracic spinal or foraminal stenosis; stable borderline to mild lumbar spinal stenosis  MRI lumbar spine in 2019 showed borderline to mild multifactorial spinal stenosis at L2-L3 and L3-L4 related to circumferential disc bulge and posterior element Hypertrophy.  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   PAST MEDICAL HISTORY: Past Medical History:  Diagnosis Date   Asthma    Cerebral hemorrhage ([place]) 1989   Chronic back pain    COPD (chronic obstructive pulmonary disease) ([place])    DDD (degenerative disc disease) 06/12/2013   DVT (deep venous thrombosis) ([place])    "she's had several since 1990"   GERD (gastroesophageal reflux disease)    Headache    "  usually around time when she's had a seizure"   History of blood transfusion    "when she was a baby"   History of stomach ulcers    Kidney stones    Neuropathy    Seizures ([place])    "because of her  brain surgery"   Stroke [facility]) 1990's   family denies residual on 11/09/2014   Tunnel vision    "since brain OR"    MEDICATIONS: Current Outpatient Medications on File Prior to Visit  Medication Sig Dispense Refill   acetaminophen (TYLENOL) 500 MG tablet Take 1,000 mg by mouth every 6 (six) hours as needed for headache.     DILANTIN 100 MG ER capsule TAKE 1 CAPSULE BY MOUTH TWICE DAILY 60 capsule 2   DILANTIN 30 MG ER capsule TAKE 1 CAPSULE BY MOUTH NIGHTLY ALONG WITH 100 MG CAPSULE FOR A TOTAL NIGHTLY DOSE OF '130MG'$  30 capsule 2   gabapentin (NEURONTIN) 300 MG capsule Take 1 capsule every night 90 capsule 3   LORazepam (ATIVAN) 1 MG tablet TAKE 1 TABLET BY MOUTH AS DIRECTED AS NEEDED FOR 3 OR MORE SEIZURES IN 24 HOURS. DO NOT TAKE MORE THAN 2 TABLETS IN 24 HOURS. 10 tablet 5   VIMPAT 200 MG TABS tablet TAKE 1 TABLET BY MOUTH TWICE DAILY 60 tablet 5   XCOPRI 100 MG TABS TAKE 1 TABLET BY MOUTH DAILY 30 tablet 10   cholecalciferol (VITAMIN D3) 25 MCG (1000 UNIT) tablet Take 1 tablet (1,000 Units total) by mouth daily. (Patient not taking: Reported on 05/20/2022) 90 tablet 1   [employer] B-12 500 MCG tablet TAKE 2 TABLETS BY MOUTH EVERY DAY (Patient not taking: Reported on 10/17/2021) 100 tablet 0   escitalopram (LEXAPRO) 10 MG tablet TAKE 1 TABLET BY MOUTH AT BEDTIME (Patient not taking: Reported on 05/20/2022) 30 tablet 2   folic acid (FOLVITE) 1 MG tablet Take 1 tablet (1 mg total) by mouth daily. (Patient not taking: Reported on 10/17/2021) 30 tablet 0   No current facility-administered medications on file prior to visit.    ALLERGIES: Allergies  Allergen Reactions   Zonisamide Other (See Comments)    Numbness and tingling over whole body   Chocolate Flavor Other (See Comments)    Trigger for seizures   Codeine Nausea And Vomiting   Keppra [Levetiracetam] Other (See Comments)    Causes seizures   Pregabalin Nausea And Vomiting   Olive Oil Rash    FAMILY HISTORY: Family History  Problem  Relation Age of Onset   Heart attack Mother        4 MIs, started in her 54s, also vaginal cancer and colon cancer   Cancer Mother    Heart attack Father        Died suddenly age 81 - autopsy revealed heart attack her patient   Cancer Brother        Sinus cancer    SOCIAL HISTORY: Social History   Socioeconomic History   Marital status: Divorced    Spouse name: Not on file   Number of children: 3   Years of education: Not on file   Highest education level: Not on file  Occupational History   Not on file  Tobacco Use   Smoking status: Former    Packs/day: 0.50    Years: 20.00    Total pack years: 10.00    Types: Cigarettes    Quit date: 10/26/2017    Years since quitting: 4.5   Smokeless tobacco: Never  Tobacco comments:    Previously smoked 2 ppd, down to 4 cigs/day  Vaping Use   Vaping Use: Never used  Substance and Sexual Activity   Alcohol use: No   Drug use: No   Sexual activity: Never    Birth control/protection: Post-menopausal  Other Topics Concern   Not on file  Social History Narrative   Pt lives in 2 story home with her daughter, daughter's family and pt's mother-in-law   Has 2 living children / 1 deceased   11-08-2022 grade education   Worked for [employer] until 1998 when she became disabled.    Right handed   Social Determinants of Health   Financial Resource Strain: Not on file  Food Insecurity: Not on file  Transportation Needs: Not on file  Physical Activity: Not on file  Stress: Not on file  Social Connections: Not on file  Intimate Partner Violence: Not on file     PHYSICAL EXAM: Vitals:   05/20/22 1426  BP: 136/76  Pulse: (!) 109  SpO2: 93%   General: No acute distress Head:  Normocephalic/atraumatic. Torticollis with head turned to the right and side to side head tremor Skin/Extremities: No rash, no edema Neurological Exam: alert and awake. . No aphasia or dysarthria. Fund of knowledge is appropriate.  Attention and concentration are  normal.   Cranial nerves: Pupils equal, round. Extraocular movements intact with no nystagmus. Visual fields full.  No facial asymmetry.  Motor: Bulk and tone normal, muscle strength 5/5 throughout with no pronator drift.   Finger to nose testing intact.  Gait slow and cautious, no ataxia.    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 She has tried multiple AEDs in the past with various side effects. She has had several admissions for Dilantin toxicity and cannot tolerate higher doses of Dilantin. There is no need to increase dose even if she has subtherapeutic levels in the future, adjustments may be made if level is supratherapeutic and she is symptomatic, otherwise continue Dilantin '100mg'$  in AM, '130mg'$  in PM, Lacosamide '200mg'$  BID. Increase Cenobamate to '150mg'$  qhs. We discussed intractable epilepsy and referral to a different academic center to discuss other potential options, we discussed VNS, RNS, but they feel that risks outweigh benefits at this point. She is reporting more back and leg pain, increase Gabapentin to '600mg'$  qhs, referral will be sent to Ortho. She dose not drive. Follow-up in 6 months, call for any changes.     Thank you for allowing me to participate in her care.  Please do not hesitate to call for any questions or concerns.    Ellouise Newer, M.D.   CC: Dr. Maudie Mercury

## 2022-06-04 IMAGING — CT CT HEAD W/O CM
2 series · 15 of 30 positions shown, 17 images · non-contrast
Comparison: CT head 12/26/2019

CLINICAL DATA: Seizure.  Fall.

EXAM:
CT HEAD WITHOUT CONTRAST
TECHNIQUE: Contiguous axial images were obtained from the base of the skull
through the vertex without intravenous contrast.

[Series 3: head bone · axial · 0.45mm/px · z∈[-81,+43]mm · 8 of 78 slices shown]
[im 8/78  bone]
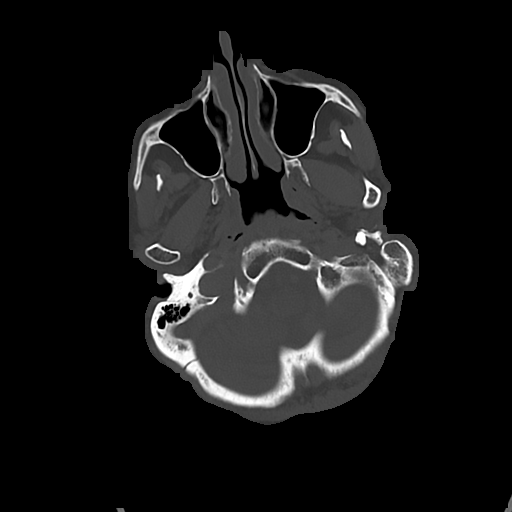
[im 16/78  bone]
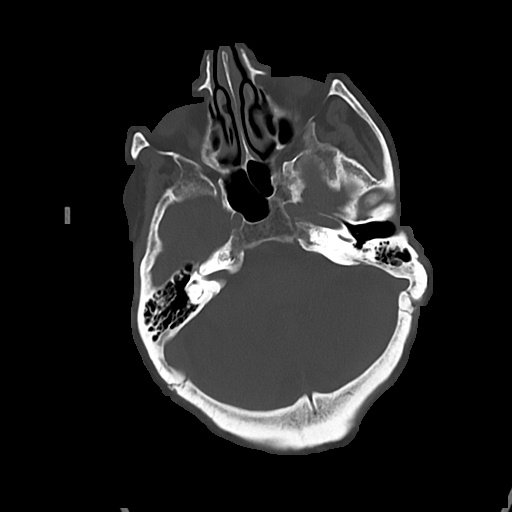
[im 24/78  bone]
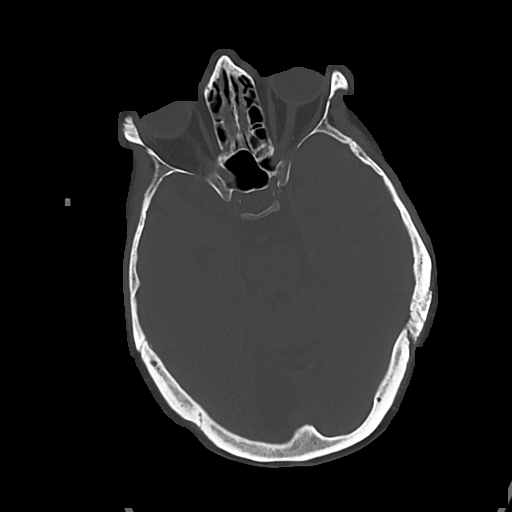
[im 35/78  bone]
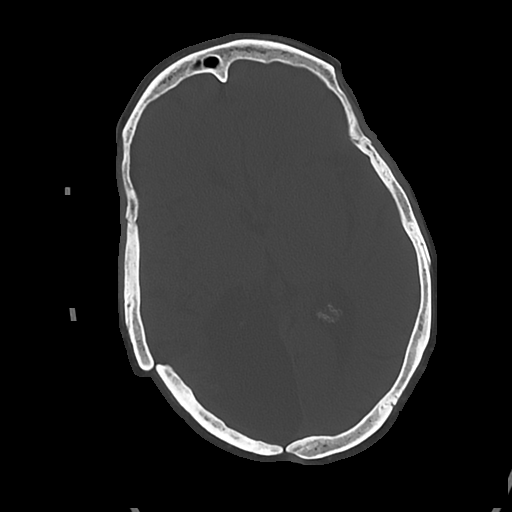
[im 43/78  bone]
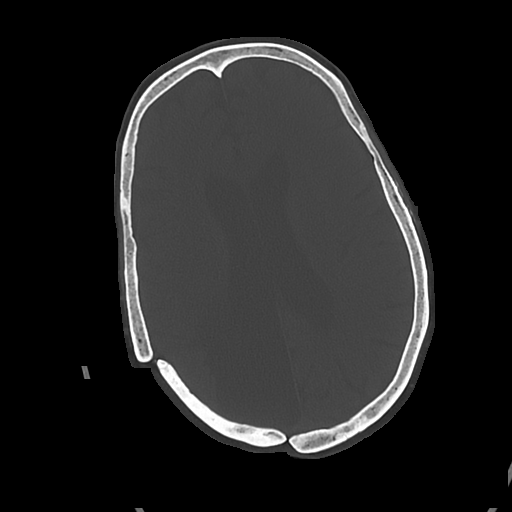
[im 54/78  bone]
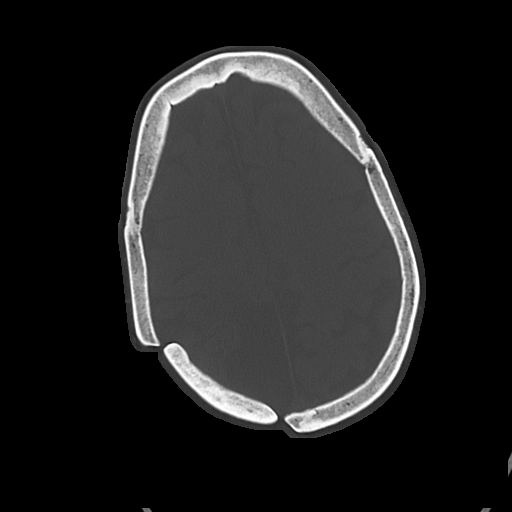
[im 62/78  bone]
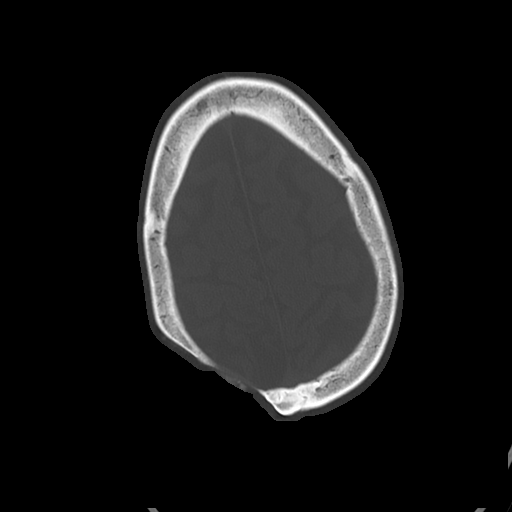
[im 70/78  bone]
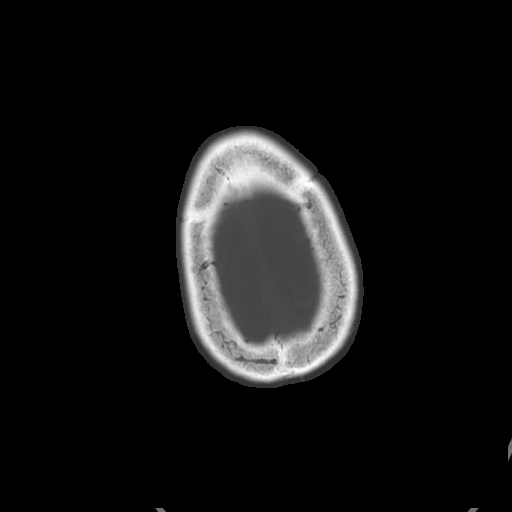

[Series 4: head wo · axial · 0.45mm/px · z∈[-80,+35]mm · 7 of 31 slices shown, 9 images]
[im 4/31  brain]
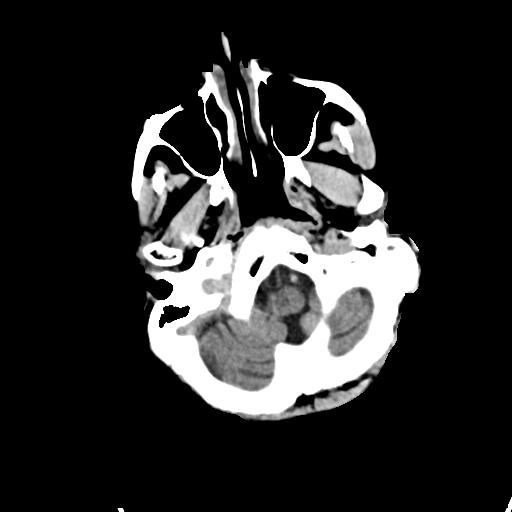
[im 4/31  bone]
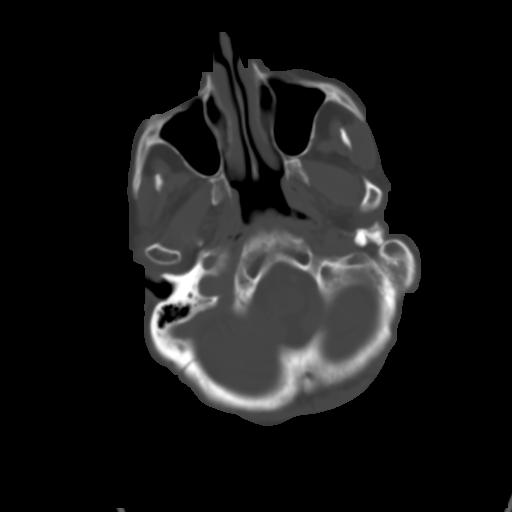
[im 8/31  brain]
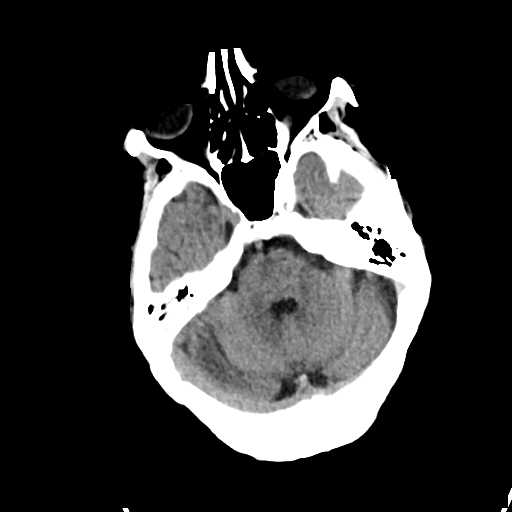
[im 12/31  brain]
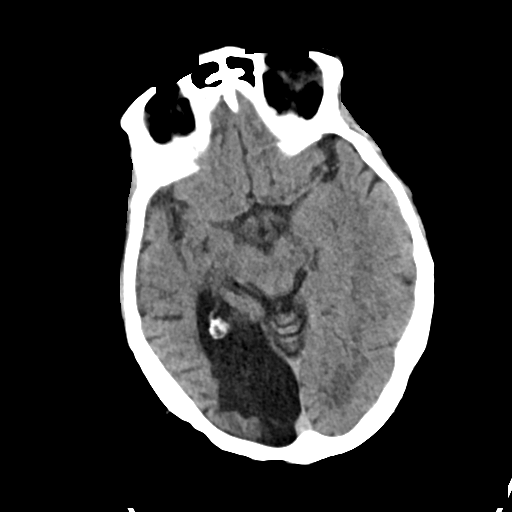
[im 16/31  brain]
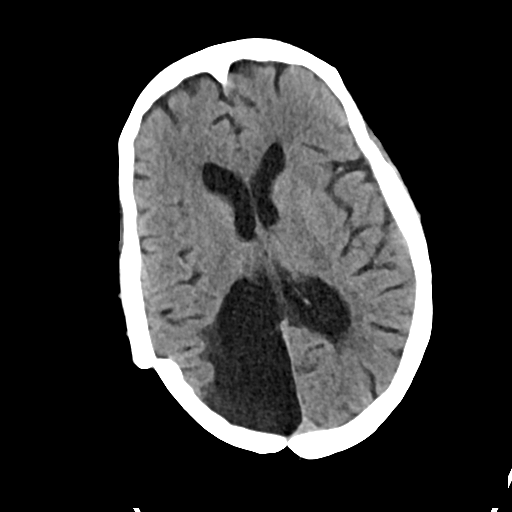
[im 19/31  brain]
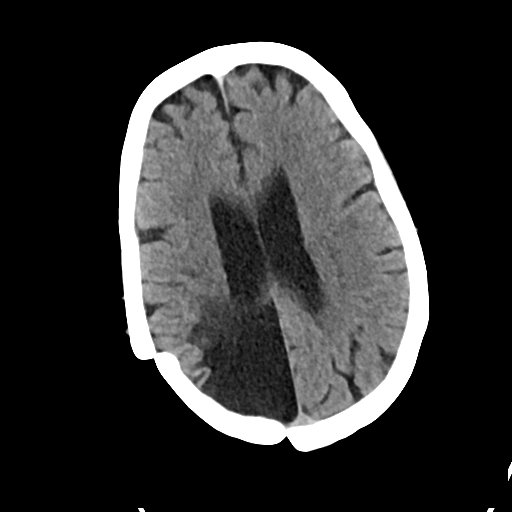
[im 19/31  bone]
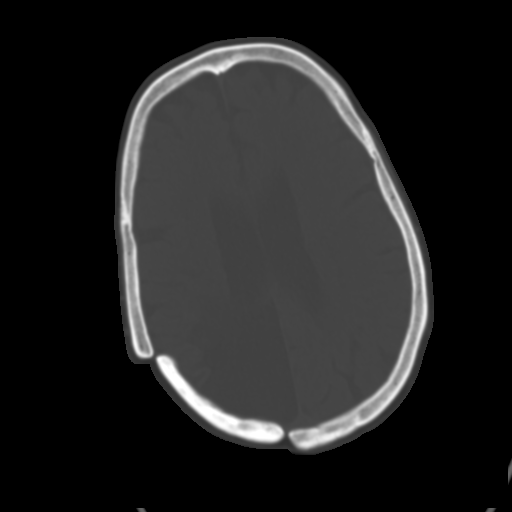
[im 23/31  brain]
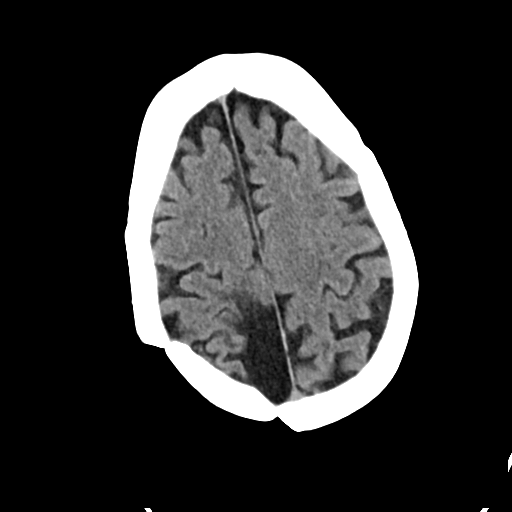
[im 27/31  brain]
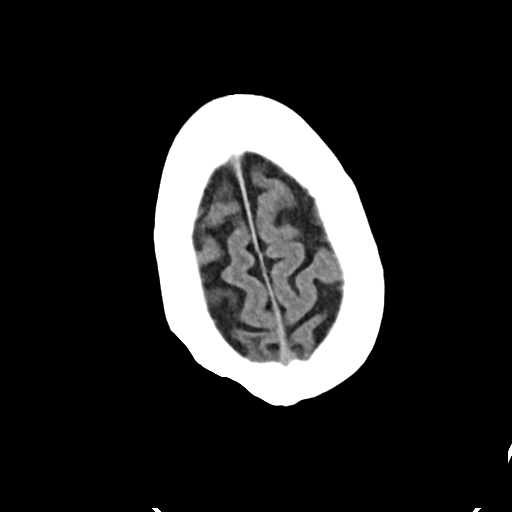

[15 of 30 positions shown; findings below may reference images not displayed]

FINDINGS: Brain: No evidence of acute large vascular territory infarction,
hemorrhage, hydrocephalus, extra-axial collection or mass
lesion/mass effect. Similar encephalomalacia in the right
parieto-occipital region with adjacent ex vacuo dilation of the
right lateral ventricle.

Vascular: Calcific atherosclerosis.

Skull: Prior right parieto-occipital craniotomy.  No acute fracture.

Sinuses/Orbits: No acute finding.

Other: Opacification of few left mastoid air cells.
IMPRESSION: 1. No evidence of acute intracranial abnormality.
2. Similar right parieto-occipital encephalomalacia.

## 2022-06-04 IMAGING — CT CT CERVICAL SPINE W/O CM
3 of 4 series · 13 of 35 positions shown, 16 images · non-contrast
Comparison: 12/26/2019

CLINICAL DATA: Head trauma.  Seizure/fall.

EXAM:
CT CERVICAL SPINE WITHOUT CONTRAST
TECHNIQUE: Multidetector CT imaging of the cervical spine was performed without
intravenous contrast. Multiplanar CT image reconstructions were also
generated.

[Series 12: sag bone · sagittal · 0.37mm/px · 5 of 69 slices shown, 6 images]
[im 23/69  bone]
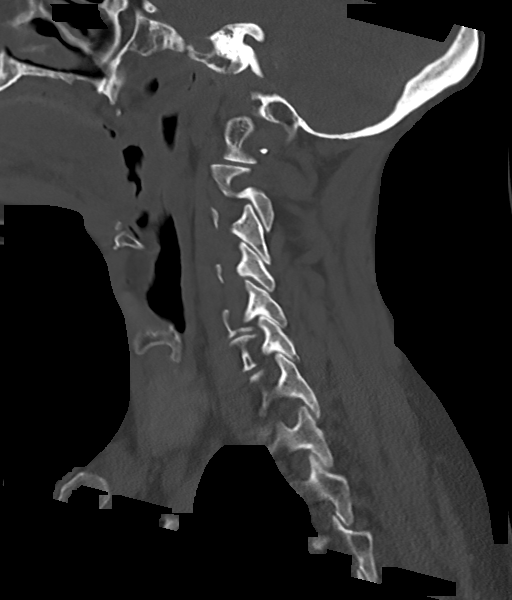
[im 29/69  bone]
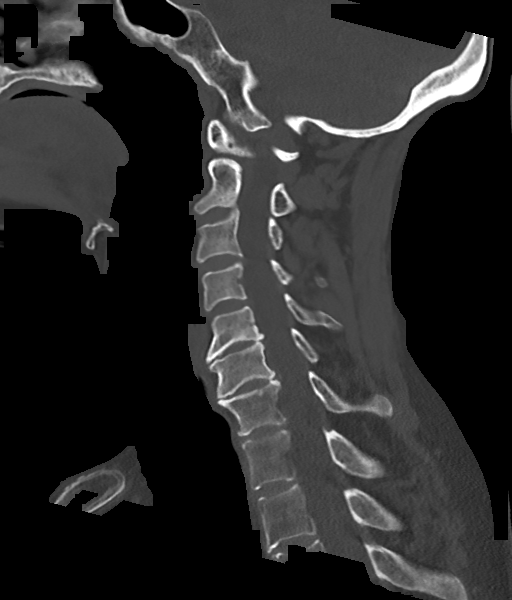
[im 35/69  soft-tissue]
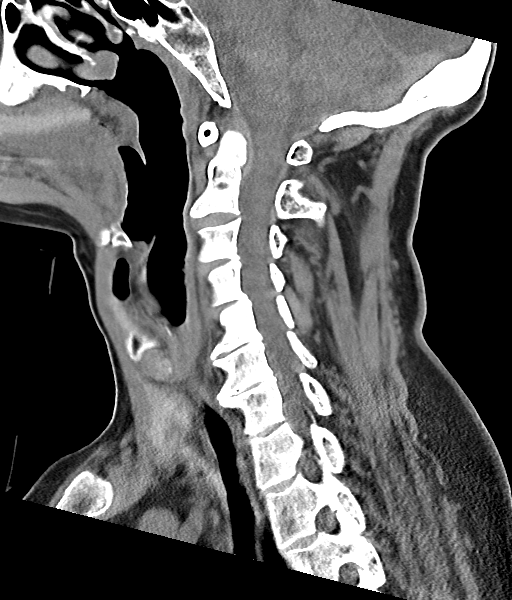
[im 35/69  bone]
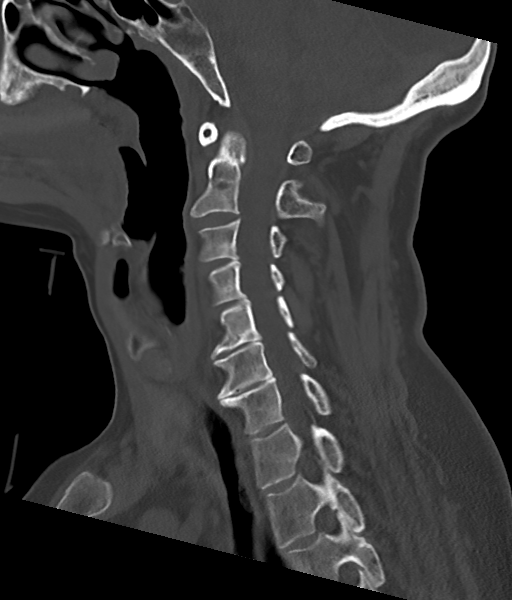
[im 40/69  bone]
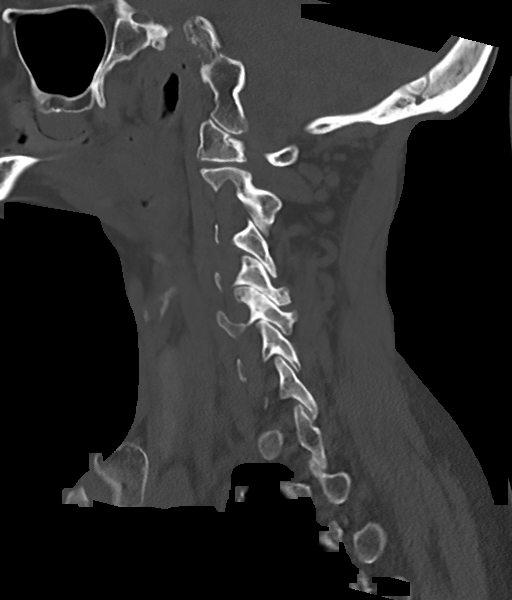
[im 46/69  bone]
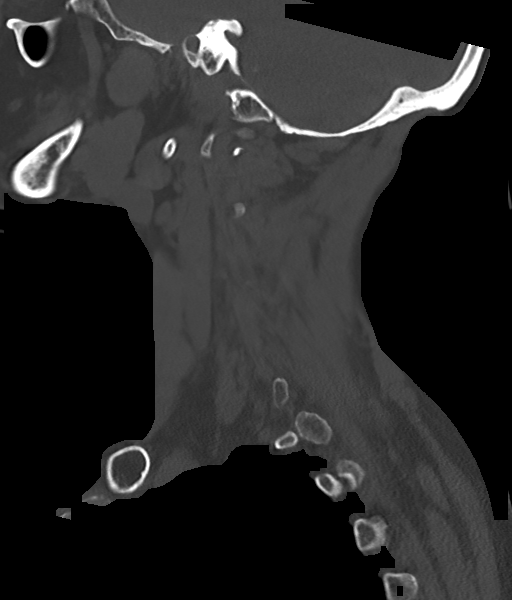

[Series 13: cor bone · coronal · 0.26mm/px · 3 of 71 slices shown]
[im 15/71  bone]
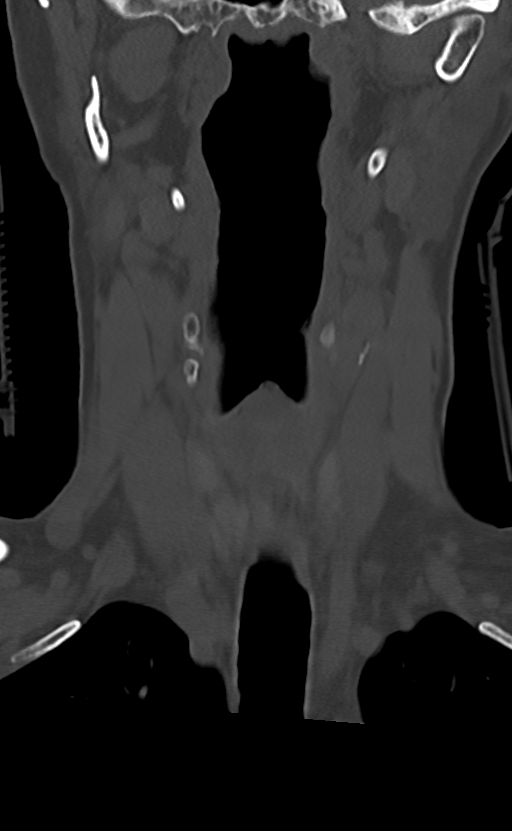
[im 29/71  bone]
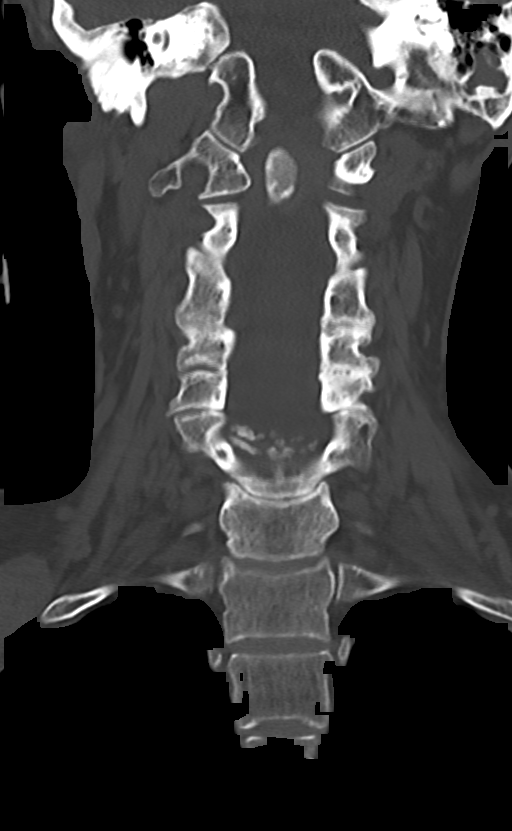
[im 43/71  bone]
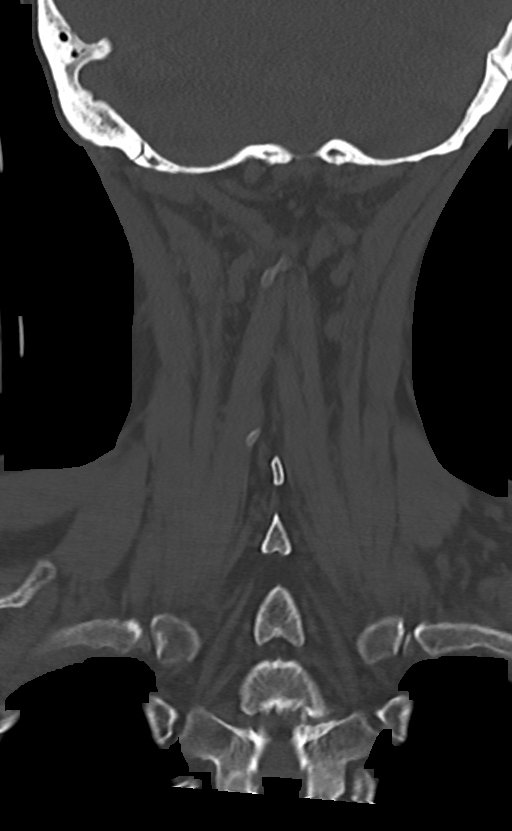

[Series 14: orthogonal axials · axial · 0.21mm/px · z∈[-238,-119]mm · 5 of 96 slices shown, 7 images]
[im 16/96  soft-tissue]
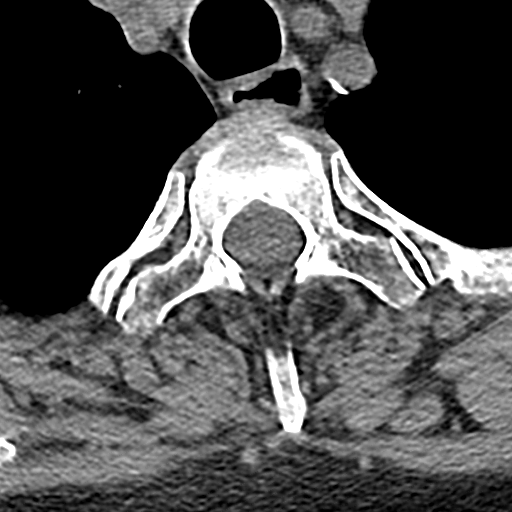
[im 16/96  bone]
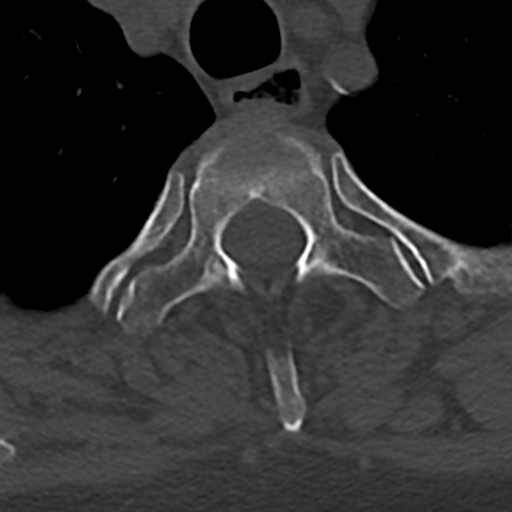
[im 32/96  bone]
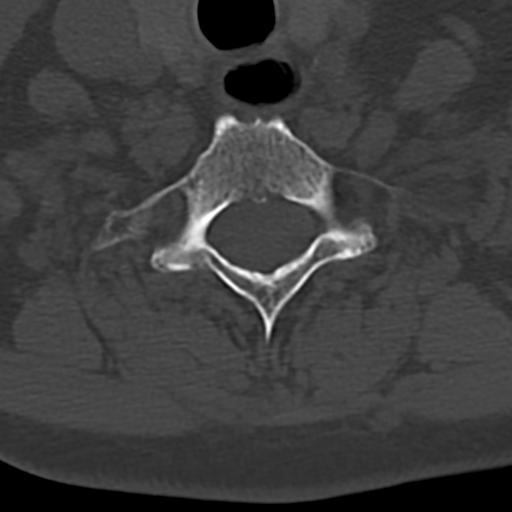
[im 48/96  bone]
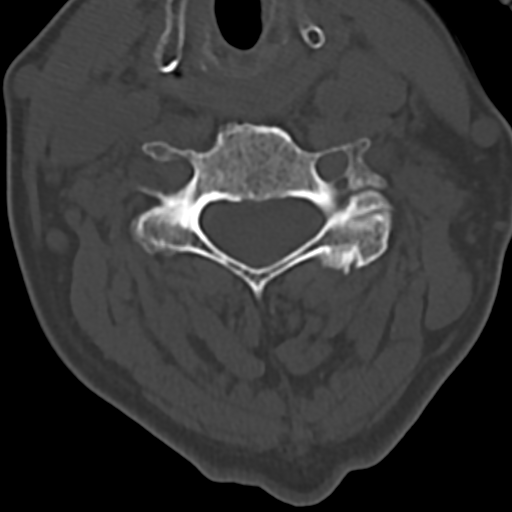
[im 64/96  bone]
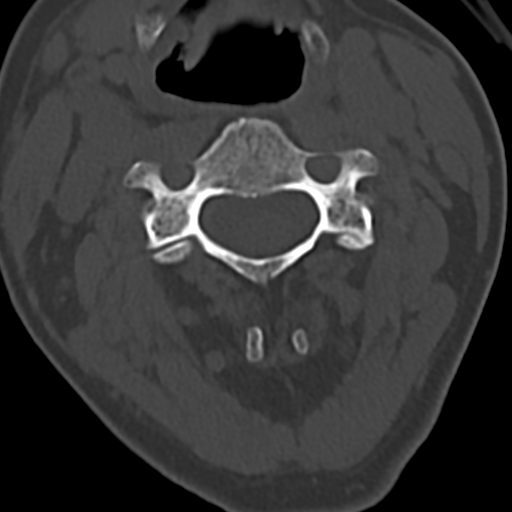
[im 80/96  soft-tissue]
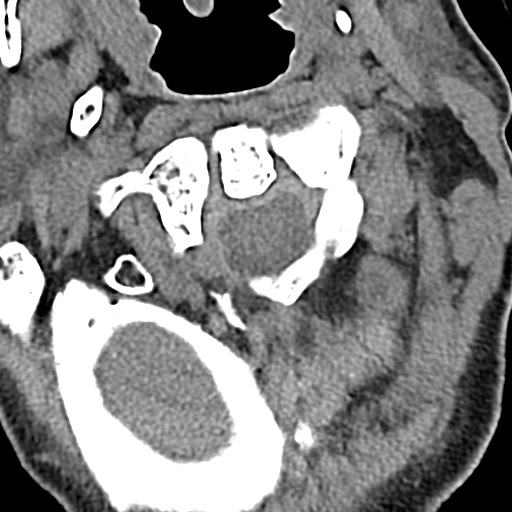
[im 80/96  bone]
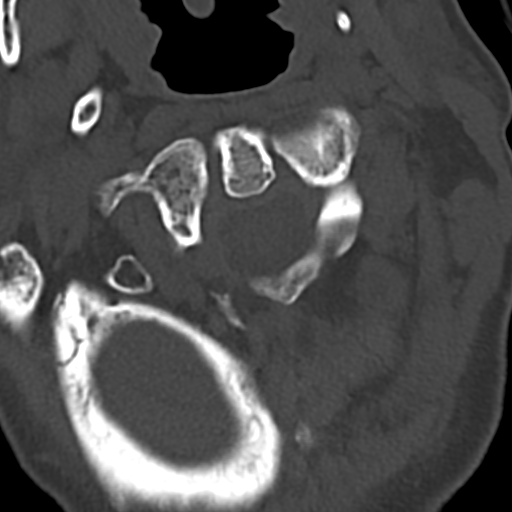

[13 of 35 positions shown; findings below may reference images not displayed]

FINDINGS: Alignment: Normal.

Skull base and vertebrae: No acute fracture. No primary bone lesion
or focal pathologic process.

Soft tissues and spinal canal: No prevertebral fluid or swelling. No
visible canal hematoma.

Disc levels: Moderate multilevel degenerative change, most
pronounced at C5-C6 and C6-C7 where there is disc height loss,
uncovertebral spurring, and posterior disc osteophyte complexes.
Likely moderate right foraminal stenosis at C5-C6. Facet arthropathy
is greatest on the left at C4-C5, where it is severe. No evidence of
advanced bony canal stenosis.

Upper chest: Emphysematous change with biapical pleuroparenchymal
scarring.
IMPRESSION: 1. No evidence of acute fracture or traumatic malalignment.
2. Similar multilevel degenerative change, as detailed above.

## 2022-06-17 ENCOUNTER — Other Ambulatory Visit: Payer: Self-pay | Admitting: Neurology

## 2022-07-12 DIAGNOSIS — M25532 Pain in left wrist: Secondary | ICD-10-CM | POA: Insufficient documentation

## 2022-07-17 DIAGNOSIS — S52502A Unspecified fracture of the lower end of left radius, initial encounter for closed fracture: Secondary | ICD-10-CM | POA: Insufficient documentation

## 2022-07-17 HISTORY — DX: Unspecified fracture of the lower end of left radius, initial encounter for closed fracture: S52.502A

## 2022-07-18 ENCOUNTER — Emergency Department (HOSPITAL_COMMUNITY): Payer: Medicare Other

## 2022-07-18 ENCOUNTER — Encounter (HOSPITAL_COMMUNITY): Payer: Self-pay

## 2022-07-18 ENCOUNTER — Other Ambulatory Visit: Payer: Self-pay

## 2022-07-18 ENCOUNTER — Emergency Department (HOSPITAL_COMMUNITY)
Admission: EM | Admit: 2022-07-18 | Discharge: 2022-07-18 | Disposition: A | Discharge status: Home / Self Care | Payer: Medicare Other | Attending: Emergency Medicine | Admitting: Emergency Medicine

## 2022-07-18 DIAGNOSIS — W19XXXA Unspecified fall, initial encounter: Secondary | ICD-10-CM

## 2022-07-18 DIAGNOSIS — R251 Tremor, unspecified: Secondary | ICD-10-CM | POA: Diagnosis not present

## 2022-07-18 DIAGNOSIS — R000 Tachycardia, unspecified: Secondary | ICD-10-CM | POA: Insufficient documentation

## 2022-07-18 DIAGNOSIS — S060X9A Concussion with loss of consciousness of unspecified duration, initial encounter: Secondary | ICD-10-CM | POA: Diagnosis not present

## 2022-07-18 DIAGNOSIS — Y92009 Unspecified place in unspecified non-institutional (private) residence as the place of occurrence of the external cause: Secondary | ICD-10-CM | POA: Diagnosis not present

## 2022-07-18 DIAGNOSIS — R519 Headache, unspecified: Secondary | ICD-10-CM | POA: Diagnosis present

## 2022-07-18 DIAGNOSIS — W01198A Fall on same level from slipping, tripping and stumbling with subsequent striking against other object, initial encounter: Secondary | ICD-10-CM | POA: Diagnosis not present

## 2022-07-18 LAB — BASIC METABOLIC PANEL
Anion gap: 10 (ref 5–15)
BUN: 12 mg/dL (ref 8–23)
CO2: 23 mmol/L (ref 22–32)
Calcium: 9.4 mg/dL (ref 8.9–10.3)
Chloride: 107 mmol/L (ref 98–111)
Creatinine, Ser: 0.77 mg/dL (ref 0.44–1.00)
GFR, Estimated: >60 mL/min (ref >60)
Glucose, Bld: 99 mg/dL (ref 70–99)
Potassium: 4.1 mmol/L (ref 3.5–5.1)
Sodium: 140 mmol/L (ref 135–145)

## 2022-07-18 LAB — PROTIME-INR
INR: 1 (ref 0.8–1.2)
Prothrombin Time: 13.5 seconds (ref 11.4–15.2)

## 2022-07-18 LAB — APTT: aPTT: 29 seconds (ref 24–36)

## 2022-07-18 LAB — CBC
HCT: 44.1 % (ref 36.0–46.0)
Hemoglobin: 14.6 g/dL (ref 12.0–15.0)
MCH: 34.3 pg — ABNORMAL HIGH (ref 26.0–34.0)
MCHC: 33.1 g/dL (ref 30.0–36.0)
MCV: 103.5 fL — ABNORMAL HIGH (ref 80.0–100.0)
Platelets: 161 10*3/uL (ref 150–400)
RBC: 4.26 MIL/uL (ref 3.87–5.11)
RDW: 12.4 % (ref 11.5–15.5)
WBC: 4.6 10*3/uL (ref 4.0–10.5)
nRBC: 0 % (ref 0.0–0.2)

## 2022-07-18 MED ORDER — ACETAMINOPHEN 325 MG PO TABS
650.0000 mg | ORAL_TABLET | Freq: Once | ORAL | Status: AC
  Administered 2022-07-18: 650 mg via ORAL
  Filled 2022-07-18: qty 2

## 2022-07-18 MED ORDER — ETODOLAC 300 MG PO CAPS: 300.0000 mg | ORAL_CAPSULE | Freq: Three times a day (TID) | ORAL | 0 refills | Status: DC

## 2022-07-18 MED ORDER — OXYCODONE-ACETAMINOPHEN 5-325 MG PO TABS
1.0000 | ORAL_TABLET | Freq: Once | ORAL | Status: AC
  Administered 2022-07-18: 1 via ORAL
  Filled 2022-07-18: qty 1

## 2022-07-18 NOTE — ED Provider Notes (Signed)
[facility] EMERGENCY DEPARTMENT Provider Note   CSN: [id number] Arrival date & time: 07/18/22  1257     History  Chief Complaint  Patient presents with   [name] is a 66 y.o. female.   Fall     Patient presented to the emergency room for evaluation of headache after recent head injury.  Patient states she was bending over on Friday.  She ended up getting dizzy when that occurred and she ended up falling and hitting her head.  Patient struck her head and since that time she has been having trouble with a persistent headache.  She is not having any trouble with any focal numbness or weakness.  She is not having any trouble with her vision.  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  She has some chronic tremors associated with that.  She also does have history of seizure disorder.  Patient states she has not had any seizures but her tremors have been worse.  With her history and her persistent headache she was concerned about having recurrent brain hemorrhage.  Home Medications Prior to Admission medications   Medication Sig Start Date End Date Taking? Authorizing Provider  etodolac (LODINE) 300 MG capsule Take 1 capsule (300 mg total) by mouth every 8 (eight) hours. 07/18/22  Yes Dorie Rank, MD  acetaminophen (TYLENOL) 500 MG tablet Take 1,000 mg by mouth every 6 (six) hours as needed for headache.    [provider]  Cenobamate (XCOPRI) 150 MG TABS Take 1 tablet every night 05/20/22   [name], MD  cholecalciferol (VITAMIN D3) 25 MCG (1000 UNIT) tablet Take 1 tablet (1,000 Units total) by mouth daily. Patient not taking: Reported on 05/20/2022 04/02/21   [name], MD  cyanocobalamin ([employer] B-12) 500 MCG tablet Take 2 tablets (1,000 mcg total) by mouth daily. 05/20/22   [name], MD  DILANTIN 100 MG ER capsule TAKE 1 CAPSULE BY MOUTH TWICE DAILY 05/20/22   [name], MD  DILANTIN 30 MG ER  capsule TAKE 1 CAPSULE BY MOUTH NIGHTLY ALONG WITH 100 MG CAPSULE FOR A TOTAL NIGHTLY DOSE OF '130MG'$  05/20/22   [name], MD  folic acid (FOLVITE) 1 MG tablet Take 1 tablet (1 mg total) by mouth daily. 05/20/22   [name], MD  gabapentin (NEURONTIN) 300 MG capsule Take 2 capsule every night 05/20/22   [name], MD  LORazepam (ATIVAN) 1 MG tablet TAKE 1 TABLET BY MOUTH AS DIRECTED AS NEEDED FOR 3 OR MORE SEIZURES IN 24 HOURS. DO NOT TAKE MORE THAN 2 TABLETS IN 24 HOURS. 02/19/22   [name], MD  VIMPAT 200 MG TABS tablet Take 1 tablet (200 mg total) by mouth 2 (two) times daily. 05/20/22   [name], MD      Allergies    Zonisamide, Chocolate flavor, Codeine, Keppra [levetiracetam], Pregabalin, and Olive oil    Review of Systems   Review of Systems  Physical Exam Updated Vital Signs BP 103/85 (BP Location: Right Arm)   Pulse 89   Temp 97.6 F (36.4 C) (Oral)   Resp 15   Ht 1.702 m ('5\' 7"'$ )   Wt 68 kg   SpO2 100%   BMI 23.49 kg/m  Physical Exam Vitals and nursing note reviewed.  Constitutional:      Appearance: She is well-developed. She is not diaphoretic.  HENT:     Head:  Normocephalic and atraumatic.     Right Ear: External ear normal.     Left Ear: External ear normal.  Eyes:     General: No visual field deficit or scleral icterus.       Right eye: No discharge.        Left eye: No discharge.     Conjunctiva/sclera: Conjunctivae normal.  Neck:     Trachea: No tracheal deviation.  Cardiovascular:     Rate and Rhythm: Normal rate.  Pulmonary:     Effort: Pulmonary effort is normal. No respiratory distress.     Breath sounds: No stridor.  Abdominal:     General: There is no distension.  Musculoskeletal:        General: No swelling or deformity.     Cervical back: Neck supple.  Skin:    General: Skin is warm and dry.     Findings: No rash.  Neurological:     Mental Status: She is alert. Mental status is at baseline.     Cranial  Nerves: No cranial nerve deficit, dysarthria or facial asymmetry.     Motor: Tremor present. No weakness or seizure activity.     ED Results / Procedures / Treatments   Labs (all labs ordered are listed, but only abnormal results are displayed) Labs Reviewed  CBC - Abnormal; Notable for the following components:      Result Value   MCV 103.5 (*)    MCH 34.3 (*)    All other components within normal limits  BASIC METABOLIC PANEL  PROTIME-INR  APTT    EKG EKG Interpretation  Date/Time:  Friday July 18 2022 14:02:45 EST Ventricular Rate:  108 PR Interval:  138 QRS Duration: 82 QT Interval:  338 QTC Calculation: 452 R Axis:   58 Text Interpretation: Sinus tachycardia Right atrial enlargement ST & T wave abnormality, consider inferior ischemia Abnormal ECG When compared with ECG of 27-Apr-2022 23:13, Since last tracing rate faster Confirmed by Dorie Rank [phone]) on 07/18/2022 4:14:09 PM  Radiology CT [name]  Result Date: 07/18/2022 CLINICAL DATA:  Low back pain after fall 1 week ago EXAM: CT LUMBAR SPINE WITHOUT CONTRAST TECHNIQUE: Multidetector CT imaging of the lumbar spine was performed without intravenous contrast administration. Multiplanar CT image reconstructions were also generated. RADIATION DOSE REDUCTION: This exam was performed according to the departmental dose-optimization program which includes automated exposure control, adjustment of the mA and/or kV according to patient size and/or use of iterative reconstruction technique. COMPARISON:  CT 04/28/2022 FINDINGS: Segmentation: 5 lumbar type vertebrae. Alignment: No traumatic listhesis. Vertebrae: No acute fracture. Unchanged mild physiologic wedging of the T11, T12, and L1 vertebrae. No lytic or sclerotic bone lesion. Paraspinal and other soft tissues: Aortic atherosclerosis. No acute findings are seen. Disc levels: Similar degree of mild multilevel lumbar spondylosis. No evidence of high-grade  foraminal or canal stenosis. IMPRESSION: 1. No acute fracture or traumatic listhesis of the lumbar spine. 2. Similar degree of mild multilevel lumbar spondylosis. 3. Aortic atherosclerosis (ICD10-I70.0). Electronically Signed   By: Davina Poke D.O.   On: 07/18/2022 16:32   CT Head Wo Contrast  Result Date: 07/18/2022 CLINICAL DATA:  Neck trauma EXAM: CT HEAD WITHOUT CONTRAST CT CERVICAL SPINE WITHOUT CONTRAST TECHNIQUE: Multidetector CT imaging of the head and cervical spine was performed following the standard protocol without intravenous contrast. Multiplanar CT image reconstructions of the cervical spine were also generated. RADIATION DOSE REDUCTION: This exam was performed according to the departmental dose-optimization program  which includes automated exposure control, adjustment of the mA and/or kV according to patient size and/or use of iterative reconstruction technique. COMPARISON:  CT Head 08/10/20 FINDINGS: CT HEAD FINDINGS Brain: Postsurgical changes from a right parietal craniotomy with slightly sunken appearance of the craniotomy flap, unchanged from prior exam. There encephalomalacia in the right occipital lobe, unchanged from prior exam. No evidence of an infarct. No hemorrhage. No hydrocephalus. There is ex vacuo dilatation of the right occipital horn, unchanged from prior exam. Vascular: No hyperdense vessel or unexpected calcification. Skull: Right parietal craniotomy. Sinuses/Orbits: No acute finding. Other: None. CT CERVICAL SPINE FINDINGS Alignment: Trace retrolisthesis of C3 on C4. Skull base and vertebrae: No acute fracture. No primary bone lesion or focal pathologic process. Soft tissues and spinal canal: No prevertebral fluid or swelling. No visible canal hematoma. Disc levels:  No evidence of high-grade spinal canal stenosis. Upper chest: Moderate centrilobular emphysema. Other: None IMPRESSION: 1. No acute intracranial abnormality. 2. No acute cervical spine fracture. Emphysema  (ICD10-J43.9). Electronically Signed   By: Marin Roberts M.D.   On: 07/18/2022 16:26   CT Cervical Spine Wo Contrast  Result Date: 07/18/2022 CLINICAL DATA:  Neck trauma EXAM: CT HEAD WITHOUT CONTRAST CT CERVICAL SPINE WITHOUT CONTRAST TECHNIQUE: Multidetector CT imaging of the head and cervical spine was performed following the standard protocol without intravenous contrast. Multiplanar CT image reconstructions of the cervical spine were also generated. RADIATION DOSE REDUCTION: This exam was performed according to the departmental dose-optimization program which includes automated exposure control, adjustment of the mA and/or kV according to patient size and/or use of iterative reconstruction technique. COMPARISON:  CT Head 08/10/20 FINDINGS: CT HEAD FINDINGS Brain: Postsurgical changes from a right parietal craniotomy with slightly sunken appearance of the craniotomy flap, unchanged from prior exam. There encephalomalacia in the right occipital lobe, unchanged from prior exam. No evidence of an infarct. No hemorrhage. No hydrocephalus. There is ex vacuo dilatation of the right occipital horn, unchanged from prior exam. Vascular: No hyperdense vessel or unexpected calcification. Skull: Right parietal craniotomy. Sinuses/Orbits: No acute finding. Other: None. CT CERVICAL SPINE FINDINGS Alignment: Trace retrolisthesis of C3 on C4. Skull base and vertebrae: No acute fracture. No primary bone lesion or focal pathologic process. Soft tissues and spinal canal: No prevertebral fluid or swelling. No visible canal hematoma. Disc levels:  No evidence of high-grade spinal canal stenosis. Upper chest: Moderate centrilobular emphysema. Other: None IMPRESSION: 1. No acute intracranial abnormality. 2. No acute cervical spine fracture. Emphysema (ICD10-J43.9). Electronically Signed   By: Marin Roberts M.D.   On: 07/18/2022 16:26   DG Chest 2 View  Result Date: 07/18/2022 CLINICAL DATA:  Shortness of breath EXAM: CHEST - 2  VIEW COMPARISON:  Radiograph 04/27/2022 FINDINGS: Unchanged cardiomediastinal silhouette. There is no focal airspace consolidation. Pulmonary hyperinflation. There is no pleural effusion or evidence of pneumothorax. Thoracic spondylosis. Chronic right distal clavicle injury. No acute osseous abnormality. Partially visualized IVC filter. IMPRESSION: Findings of COPD.  No focal airspace disease. Electronically Signed   By: Maurine Simmering M.D.   On: 07/18/2022 14:23    Procedures Procedures    Medications Ordered in ED Medications  oxyCODONE-acetaminophen (PERCOCET/ROXICET) 5-325 MG per tablet 1 tablet (has no administration in time range)  acetaminophen (TYLENOL) tablet 650 mg (650 mg Oral Given 07/18/22 1400)    ED Course/ Medical Decision Making/ A&P Clinical Course as of 07/18/22 1928  Fri Jul 18, 2022  1925 Labs reviewed.  No anemia.  Normal metabolic panel. [WP]  1927 CT scans reviewed , no acute abnormality.  CXR without acute findings [JK]    Clinical Course User Index [JK] Dorie Rank, MD                           Medical Decision Making Problems Addressed: Concussion with loss of consciousness, initial encounter: acute illness or injury that poses a threat to life or bodily functions Fall, initial encounter: acute illness or injury that poses a threat to life or bodily functions  Amount and/or Complexity of Data Reviewed Labs: ordered. Decision-making details documented in ED Course. Radiology: ordered and independent interpretation performed.  Risk Prescription drug management.   Pt presented with persistent headache after a head injury.   Fortunately no sign of hemorrhage or other acute abnormality on CT scan.  No c spine fx.  No lspine injury.  Labs unremarkable.  No focal deficit on neuro exam to suggest acute stroke. Tremors noted but pt has history of same.   Ed workup reassuring.  Suspect concussion. Evaluation and diagnostic testing in the emergency department does not  suggest an emergent condition requiring admission or immediate intervention beyond what has been performed at this time.  The patient is safe for discharge and has been instructed to return immediately for worsening symptoms, change in symptoms or any other concerns.          Final Clinical Impression(s) / ED Diagnoses Final diagnoses:  Fall, initial encounter  Concussion with loss of consciousness, initial encounter    Rx / DC Orders ED Discharge Orders          Ordered    etodolac (LODINE) 300 MG capsule  Every 8 hours       Note to Pharmacy: As needed for pain   07/18/22 Angus Palms, MD 07/18/22 2000

## 2022-07-18 NOTE — ED Triage Notes (Signed)
Pt arrived POV from home c/o a fall that happened on Friday and she fell and hit the back of her head. Pt states she has a headache, is dizzy and today her tremors are worse than normal.

## 2022-07-18 NOTE — Discharge Instructions (Addendum)
The CT scans did not show signs of serious injury fortunately.  Take medications as needed for pain.  Follow up with your doctor to be rechecked if not improving in the next week.

## 2022-07-18 NOTE — ED Provider Triage Note (Signed)
Emergency Medicine Provider Triage Evaluation Note  [name] , a 66 y.o. female  was evaluated in triage.  Pt complains of severe posterior headache, dizziness, ongoing neck pain, low back pain after fall 1 week ago.  Patient with previous history of cerebral hemorrhage is on multiple medications for seizure disorder that developed after previous cerebral hemorrhage.  Patient reports that he also generally has a little bit of tremor, but her tremor is more pronounced over the last 2 days.  She denies nausea, vomiting, numbness, vision changes.  Review of Systems  Positive: Head pain, neck pain, low back pain, tremor Negative: Numbness, tingling, nausea, vomiting  Physical Exam  BP 127/84 (BP Location: Right Arm)   Pulse (!) 114   Temp (!) 97.1 F (36.2 C)   Resp (!) 24   SpO2 93%  Gen:   Awake, no distress   Resp:  Normal effort  MSK:   Moves extremities without difficulty  Other:  Patient with shaking tremor, but no focal neuro deficits, TTP in cervical spine in left paraspinous region, additional ttp in lumbar midline spine. Moves all 4 limbs spontaneously, answers questions appropriately  Medical Decision Making  Medically screening exam initiated at 1:51 PM.  Appropriate orders placed.  [name] was informed that the remainder of the evaluation will be completed by another provider, this initial triage assessment does not replace that evaluation, and the importance of remaining in the ED until their evaluation is complete.  Workup initiated   [name], [place] 07/18/22 1354

## 2022-07-18 NOTE — ED Notes (Signed)
Patient provided graham crackers to minimize stomach discomfort.

## 2022-07-28 ENCOUNTER — Emergency Department (HOSPITAL_COMMUNITY): Payer: Medicare Other

## 2022-07-28 ENCOUNTER — Inpatient Hospital Stay (HOSPITAL_COMMUNITY)
Admission: EM | Admit: 2022-07-28 | Discharge: 2022-08-06 | DRG: 101 | Disposition: A | Discharge status: Home Health | Payer: Medicare Other | Attending: Internal Medicine | Admitting: Internal Medicine

## 2022-07-28 ENCOUNTER — Other Ambulatory Visit: Payer: Self-pay

## 2022-07-28 ENCOUNTER — Encounter (HOSPITAL_COMMUNITY): Payer: Self-pay | Admitting: Emergency Medicine

## 2022-07-28 DIAGNOSIS — Z802 Family history of malignant neoplasm of other respiratory and intrathoracic organs: Secondary | ICD-10-CM

## 2022-07-28 DIAGNOSIS — Z6823 Body mass index (BMI) 23.0-23.9, adult: Secondary | ICD-10-CM

## 2022-07-28 DIAGNOSIS — Z8673 Personal history of transient ischemic attack (TIA), and cerebral infarction without residual deficits: Secondary | ICD-10-CM

## 2022-07-28 DIAGNOSIS — Z885 Allergy status to narcotic agent status: Secondary | ICD-10-CM

## 2022-07-28 DIAGNOSIS — J440 Chronic obstructive pulmonary disease with acute lower respiratory infection: Secondary | ICD-10-CM | POA: Diagnosis present

## 2022-07-28 DIAGNOSIS — R251 Tremor, unspecified: Secondary | ICD-10-CM | POA: Diagnosis present

## 2022-07-28 DIAGNOSIS — D696 Thrombocytopenia, unspecified: Secondary | ICD-10-CM | POA: Diagnosis present

## 2022-07-28 DIAGNOSIS — R569 Unspecified convulsions: Secondary | ICD-10-CM | POA: Diagnosis not present

## 2022-07-28 DIAGNOSIS — G8929 Other chronic pain: Secondary | ICD-10-CM | POA: Diagnosis present

## 2022-07-28 DIAGNOSIS — K759 Inflammatory liver disease, unspecified: Secondary | ICD-10-CM

## 2022-07-28 DIAGNOSIS — Z23 Encounter for immunization: Secondary | ICD-10-CM

## 2022-07-28 DIAGNOSIS — J209 Acute bronchitis, unspecified: Secondary | ICD-10-CM

## 2022-07-28 DIAGNOSIS — Z87891 Personal history of nicotine dependence: Secondary | ICD-10-CM

## 2022-07-28 DIAGNOSIS — R509 Fever, unspecified: Secondary | ICD-10-CM | POA: Diagnosis present

## 2022-07-28 DIAGNOSIS — S52615D Nondisplaced fracture of left ulna styloid process, subsequent encounter for closed fracture with routine healing: Secondary | ICD-10-CM

## 2022-07-28 DIAGNOSIS — E46 Unspecified protein-calorie malnutrition: Secondary | ICD-10-CM | POA: Diagnosis present

## 2022-07-28 DIAGNOSIS — K219 Gastro-esophageal reflux disease without esophagitis: Secondary | ICD-10-CM | POA: Diagnosis present

## 2022-07-28 DIAGNOSIS — J449 Chronic obstructive pulmonary disease, unspecified: Secondary | ICD-10-CM | POA: Diagnosis present

## 2022-07-28 DIAGNOSIS — N393 Stress incontinence (female) (male): Secondary | ICD-10-CM | POA: Diagnosis present

## 2022-07-28 DIAGNOSIS — S52515A Nondisplaced fracture of left radial styloid process, initial encounter for closed fracture: Secondary | ICD-10-CM

## 2022-07-28 DIAGNOSIS — S52515D Nondisplaced fracture of left radial styloid process, subsequent encounter for closed fracture with routine healing: Secondary | ICD-10-CM

## 2022-07-28 DIAGNOSIS — G629 Polyneuropathy, unspecified: Secondary | ICD-10-CM | POA: Diagnosis present

## 2022-07-28 DIAGNOSIS — Z1152 Encounter for screening for COVID-19: Secondary | ICD-10-CM

## 2022-07-28 DIAGNOSIS — Z800 Family history of malignant neoplasm of digestive organs: Secondary | ICD-10-CM

## 2022-07-28 DIAGNOSIS — Z79899 Other long term (current) drug therapy: Secondary | ICD-10-CM

## 2022-07-28 DIAGNOSIS — M47816 Spondylosis without myelopathy or radiculopathy, lumbar region: Secondary | ICD-10-CM | POA: Diagnosis present

## 2022-07-28 DIAGNOSIS — K429 Umbilical hernia without obstruction or gangrene: Secondary | ICD-10-CM | POA: Diagnosis present

## 2022-07-28 DIAGNOSIS — G40909 Epilepsy, unspecified, not intractable, without status epilepticus: Secondary | ICD-10-CM | POA: Diagnosis not present

## 2022-07-28 DIAGNOSIS — E876 Hypokalemia: Secondary | ICD-10-CM | POA: Diagnosis present

## 2022-07-28 DIAGNOSIS — R7989 Other specified abnormal findings of blood chemistry: Secondary | ICD-10-CM | POA: Diagnosis present

## 2022-07-28 DIAGNOSIS — Z8249 Family history of ischemic heart disease and other diseases of the circulatory system: Secondary | ICD-10-CM

## 2022-07-28 DIAGNOSIS — E538 Deficiency of other specified B group vitamins: Secondary | ICD-10-CM | POA: Diagnosis present

## 2022-07-28 DIAGNOSIS — Z86718 Personal history of other venous thrombosis and embolism: Secondary | ICD-10-CM

## 2022-07-28 DIAGNOSIS — E441 Mild protein-calorie malnutrition: Secondary | ICD-10-CM | POA: Diagnosis present

## 2022-07-28 DIAGNOSIS — I480 Paroxysmal atrial fibrillation: Secondary | ICD-10-CM | POA: Diagnosis present

## 2022-07-28 DIAGNOSIS — Z888 Allergy status to other drugs, medicaments and biological substances status: Secondary | ICD-10-CM

## 2022-07-28 DIAGNOSIS — Z9102 Food additives allergy status: Secondary | ICD-10-CM

## 2022-07-28 DIAGNOSIS — J441 Chronic obstructive pulmonary disease with (acute) exacerbation: Secondary | ICD-10-CM | POA: Diagnosis present

## 2022-07-28 DIAGNOSIS — W1830XD Fall on same level, unspecified, subsequent encounter: Secondary | ICD-10-CM

## 2022-07-28 LAB — BLOOD GAS, VENOUS
Acid-Base Excess: 6.5 mmol/L — ABNORMAL HIGH (ref 0.0–2.0)
Bicarbonate: 33.1 mmol/L — ABNORMAL HIGH (ref 20.0–28.0)
O2 Saturation: 35.2 %
Patient temperature: 37
pCO2, Ven: 56 mmHg (ref 44–60)
pH, Ven: 7.38 (ref 7.25–7.43)
pO2, Ven: <31 mmHg — CL (ref 32–45)

## 2022-07-28 LAB — RESP PANEL BY RT-PCR (RSV, FLU A&B, COVID)  RVPGX2
Influenza A by PCR: NEGATIVE
Influenza B by PCR: NEGATIVE
Resp Syncytial Virus by PCR: NEGATIVE
SARS Coronavirus 2 by RT PCR: NEGATIVE

## 2022-07-28 LAB — COMPREHENSIVE METABOLIC PANEL
ALT: 14 U/L (ref 0–44)
AST: 18 U/L (ref 15–41)
Albumin: 3.3 g/dL — ABNORMAL LOW (ref 3.5–5.0)
Alkaline Phosphatase: 69 U/L (ref 38–126)
Anion gap: 9 (ref 5–15)
BUN: 10 mg/dL (ref 8–23)
CO2: 28 mmol/L (ref 22–32)
Calcium: 8.7 mg/dL — ABNORMAL LOW (ref 8.9–10.3)
Chloride: 102 mmol/L (ref 98–111)
Creatinine, Ser: 0.7 mg/dL (ref 0.44–1.00)
GFR, Estimated: >60 mL/min (ref >60)
Glucose, Bld: 121 mg/dL — ABNORMAL HIGH (ref 70–99)
Potassium: 3.6 mmol/L (ref 3.5–5.1)
Sodium: 139 mmol/L (ref 135–145)
Total Bilirubin: 0.6 mg/dL (ref 0.3–1.2)
Total Protein: 6.8 g/dL (ref 6.5–8.1)

## 2022-07-28 LAB — CBC WITH DIFFERENTIAL/PLATELET
Abs Immature Granulocytes: 0.04 10*3/uL (ref 0.00–0.07)
Basophils Absolute: 0 10*3/uL (ref 0.0–0.1)
Basophils Relative: 0 %
Eosinophils Absolute: 0.1 10*3/uL (ref 0.0–0.5)
Eosinophils Relative: 1 %
HCT: 35.8 % — ABNORMAL LOW (ref 36.0–46.0)
Hemoglobin: 11.8 g/dL — ABNORMAL LOW (ref 12.0–15.0)
Immature Granulocytes: 1 %
Lymphocytes Relative: 12 %
Lymphs Abs: 0.9 10*3/uL (ref 0.7–4.0)
MCH: 34.6 pg — ABNORMAL HIGH (ref 26.0–34.0)
MCHC: 33 g/dL (ref 30.0–36.0)
MCV: 105 fL — ABNORMAL HIGH (ref 80.0–100.0)
Monocytes Absolute: 0.8 10*3/uL (ref 0.1–1.0)
Monocytes Relative: 11 %
Neutro Abs: 5.7 10*3/uL (ref 1.7–7.7)
Neutrophils Relative %: 75 %
Platelets: 142 10*3/uL — ABNORMAL LOW (ref 150–400)
RBC: 3.41 MIL/uL — ABNORMAL LOW (ref 3.87–5.11)
RDW: 12 % (ref 11.5–15.5)
WBC: 7.5 10*3/uL (ref 4.0–10.5)
nRBC: 0 % (ref 0.0–0.2)

## 2022-07-28 LAB — CK: Total CK: 182 U/L (ref 38–234)

## 2022-07-28 LAB — LIPASE, BLOOD: Lipase: 32 U/L (ref 11–51)

## 2022-07-28 LAB — LACTIC ACID, PLASMA: Lactic Acid, Venous: 1 mmol/L (ref 0.5–1.9)

## 2022-07-28 MED ORDER — LACTATED RINGERS IV BOLUS
1000.0000 mL | Freq: Once | INTRAVENOUS | Status: AC
  Administered 2022-07-28: 1000 mL via INTRAVENOUS

## 2022-07-28 MED ORDER — ACETAMINOPHEN 325 MG PO TABS
650.0000 mg | ORAL_TABLET | Freq: Once | ORAL | Status: AC
  Administered 2022-07-28: 650 mg via ORAL
  Filled 2022-07-28: qty 2

## 2022-07-28 NOTE — ED Notes (Signed)
Informed pt need for urine specimen, will collect following fluid bolus completion.

## 2022-07-28 NOTE — Discharge Instructions (Addendum)
It was a pleasure caring for you today in the emergency department.  Please return to the emergency department for any worsening or worrisome symptoms.  Please follow up with pcp for recheck of your hemoglobin in the next 3-5 days.

## 2022-07-28 NOTE — ED Provider Notes (Signed)
[facility] DEPT Provider Note   CSN: [id number] Arrival date & time: 07/28/22  2111     History  Chief Complaint  Patient presents with   Seizures    [name] is a 66 y.o. female.  Patient as above with significant medical history as below, including seizures on vimpat/dilantin with good compliance (f/w [facility] neuro), COPD no longer smoking, ICH who presents to the ED with complaint of seizure Pt arrives via ems for fatigue following seizure around 1600 Pt with witnessed seizure at home per EMS, resolved spontaneously She was fatigued/more sleepy than normal following seizure and family called ems Pt reports feeling fatigued entire day, reports body aches and a cough that is not productive Poor appetite last day or so No change to urine/bowel function, no chest pain or dib, no abd pain No sick contacts she is aware of Pt believes she was standing when she had her seizure, thinks she hit her head on the ground during seizure       Past Medical History:  Diagnosis Date   Asthma    Cerebral hemorrhage ([place]) 1989   Chronic back pain    COPD (chronic obstructive pulmonary disease) ([place])    DDD (degenerative disc disease) 06/12/2013   DVT (deep venous thrombosis) ([place])    "she's had several since 1990"   GERD (gastroesophageal reflux disease)    Headache    "usually around time when she's had a seizure"   History of blood transfusion    "when she was a baby"   History of stomach ulcers    Kidney stones    Neuropathy    Seizures ([place])    "because of her brain surgery"   Stroke [facility]) 1990's   family denies residual on 11/09/2014   Tunnel vision    "since brain OR"    Past Surgical History:  Procedure Laterality Date   [place]   craniotomy with hematoma evacuation   [place]; 1987; [place]     IVC FILTER INSERTION N/A 10/12/2020   Procedure: IVC FILTER INSERTION;  Surgeon: Angelia Mould, MD;  Location: [facility];  Service: Cardiovascular;  Laterality: N/A;   TUBAL LIGATION  1990's     The history is provided by the EMS personnel and the patient. No language interpreter was used.       Home Medications Prior to Admission medications   Medication Sig Start Date End Date Taking? Authorizing Provider  acetaminophen (TYLENOL) 500 MG tablet Take 1,000 mg by mouth every 6 (six) hours as needed for headache.    [provider]  Cenobamate (XCOPRI) 150 MG TABS Take 1 tablet every night 05/20/22   [name], MD  cholecalciferol (VITAMIN D3) 25 MCG (1000 UNIT) tablet Take 1 tablet (1,000 Units total) by mouth daily. Patient not taking: Reported on 05/20/2022 04/02/21   [name], MD  cyanocobalamin ([employer] B-12) 500 MCG tablet Take 2 tablets (1,000 mcg total) by mouth daily. 05/20/22   [name], MD  DILANTIN 100 MG ER capsule TAKE 1 CAPSULE BY MOUTH TWICE DAILY 05/20/22   [name], MD  DILANTIN 30 MG ER capsule TAKE 1 CAPSULE BY MOUTH NIGHTLY ALONG WITH 100 MG CAPSULE FOR A TOTAL NIGHTLY DOSE OF '130MG'$  05/20/22   [name], MD  etodolac (LODINE) 300 MG capsule Take 1 capsule (300 mg total) by mouth every 8 (eight) hours. 07/18/22  Dorie Rank, MD  folic acid (FOLVITE) 1 MG tablet Take 1 tablet (1 mg total) by mouth daily. 05/20/22   [name], MD  gabapentin (NEURONTIN) 300 MG capsule Take 2 capsule every night 05/20/22   [name], MD  LORazepam (ATIVAN) 1 MG tablet TAKE 1 TABLET BY MOUTH AS DIRECTED AS NEEDED FOR 3 OR MORE SEIZURES IN 24 HOURS. DO NOT TAKE MORE THAN 2 TABLETS IN 24 HOURS. 02/19/22   [name], MD  VIMPAT 200 MG TABS tablet Take 1 tablet (200 mg total) by mouth 2 (two) times daily. 05/20/22   [name], MD      Allergies    Zonisamide, Chocolate flavor, Codeine, Keppra [levetiracetam], Pregabalin, and Olive oil    Review of Systems   Review of Systems  Constitutional:  Positive  for fatigue and fever. Negative for chills.  HENT:  Negative for facial swelling and trouble swallowing.   Eyes:  Negative for photophobia and visual disturbance.  Respiratory:  Positive for cough. Negative for shortness of breath.   Cardiovascular:  Negative for chest pain and palpitations.  Gastrointestinal:  Negative for abdominal pain, nausea and vomiting.  Endocrine: Negative for polydipsia and polyuria.  Genitourinary:  Negative for difficulty urinating and hematuria.  Musculoskeletal:  Positive for arthralgias. Negative for gait problem and joint swelling.  Skin:  Negative for pallor and rash.  Neurological:  Positive for seizures. Negative for syncope and headaches.  Psychiatric/Behavioral:  Negative for agitation and confusion.     Physical Exam Updated Vital Signs BP (!) 100/53   Pulse 97   Temp (!) 103.1 F (39.5 C) (Rectal)   Resp (!) 28   Ht '5\' 7"'$  (1.702 m)   Wt 68 kg   SpO2 94%   BMI 23.48 kg/m  Physical Exam Vitals and nursing note reviewed.  Constitutional:      General: She is not in acute distress.    Appearance: Normal appearance. She is not ill-appearing.     Comments: frail  HENT:     Head: Normocephalic and atraumatic. No raccoon eyes, Battle's sign, right periorbital erythema or left periorbital erythema.     Jaw: There is normal jaw occlusion.     Right Ear: External ear normal.     Left Ear: External ear normal.     Nose: Nose normal.     Mouth/Throat:     Mouth: Mucous membranes are moist.  Eyes:     General: No scleral icterus.       Right eye: No discharge.        Left eye: No discharge.     Extraocular Movements: Extraocular movements intact.     Pupils: Pupils are equal, round, and reactive to light.  Cardiovascular:     Rate and Rhythm: Normal rate and regular rhythm.     Pulses: Normal pulses.     Heart sounds: Normal heart sounds.  Pulmonary:     Effort: Pulmonary effort is normal. No respiratory distress.     Breath sounds:  Wheezing present.     Comments: Trace wheezing b/l Abdominal:     General: Abdomen is flat.     Palpations: Abdomen is soft.     Tenderness: There is no abdominal tenderness.  Musculoskeletal:        General: Normal range of motion.       Arms:     Cervical back: Full passive range of motion without pain and normal range of motion.  Right lower leg: No edema.     Left lower leg: No edema.  Skin:    General: Skin is warm and dry.     Capillary Refill: Capillary refill takes less than 2 seconds.  Neurological:     Mental Status: She is alert and oriented to person, place, and time.     GCS: GCS eye subscore is 4. GCS verbal subscore is 5. GCS motor subscore is 6.     Cranial Nerves: Cranial nerves 2-12 are intact. No dysarthria.     Sensory: Sensation is intact.     Motor: Motor function is intact.     Coordination: Coordination is intact.  Psychiatric:        Mood and Affect: Mood normal.        Behavior: Behavior normal.     ED Results / Procedures / Treatments   Labs (all labs ordered are listed, but only abnormal results are displayed) Labs Reviewed  CBC WITH DIFFERENTIAL/PLATELET - Abnormal; Notable for the following components:      Result Value   RBC 3.41 (*)    Hemoglobin 11.8 (*)    HCT 35.8 (*)    MCV 105.0 (*)    MCH 34.6 (*)    Platelets 142 (*)    All other components within normal limits  COMPREHENSIVE METABOLIC PANEL - Abnormal; Notable for the following components:   Glucose, Bld 121 (*)    Calcium 8.7 (*)    Albumin 3.3 (*)    All other components within normal limits  BLOOD GAS, VENOUS - Abnormal; Notable for the following components:   pO2, Ven <31 (*)    Bicarbonate 33.1 (*)    Acid-Base Excess 6.5 (*)    All other components within normal limits  RESP PANEL BY RT-PCR (RSV, FLU A&B, COVID)  RVPGX2  LIPASE, BLOOD  CK  LACTIC ACID, PLASMA  PHENYTOIN LEVEL, FREE AND TOTAL  LACOSAMIDE  URINALYSIS, ROUTINE W REFLEX MICROSCOPIC  LACTIC ACID,  PLASMA  RAPID URINE DRUG SCREEN, HOSP PERFORMED    EKG EKG Interpretation  Date/Time:  Monday July 28 2022 21:34:57 EST Ventricular Rate:  101 PR Interval:  164 QRS Duration: 109 QT Interval:  433 QTC Calculation: 562 R Axis:   87 Text Interpretation: Sinus tachycardia Paired ventricular premature complexes Aberrant conduction of SV complex(es) Biatrial enlargement Borderline right axis deviation Nonspecific repol abnormality, diffuse leads Prolonged QT interval TECHNICALLY DIFFICULT Confirmed by Wynona Dove ([id number]) on 07/28/2022 11:51:26 PM  Radiology CT Head Wo Contrast  Result Date: 07/28/2022 CLINICAL DATA:  Head trauma, moderate-to-severe. Patient had witnessed seizures around 1,600. EXAM: CT HEAD WITHOUT CONTRAST TECHNIQUE: Contiguous axial images were obtained from the base of the skull through the vertex without intravenous contrast. RADIATION DOSE REDUCTION: This exam was performed according to the departmental dose-optimization program which includes automated exposure control, adjustment of the mA and/or kV according to patient size and/or use of iterative reconstruction technique. COMPARISON:  CT head dated July 18, 2022 FINDINGS: Brain: No evidence of acute infarction, hemorrhage, hydrocephalus, extra-axial collection or mass lesion/mass effect. Encephalomalacia of the right occipital lobe and ex vacuo dilatation of the occipital horn. Vascular: No hyperdense vessel or unexpected calcification. Skull: Right occipital craniectomy changes. No acute fracture or significant soft tissue changes. Sinuses/Orbits: No acute finding. Other: None. IMPRESSION: 1. No acute intracranial abnormality. 2. Encephalomalacia of the right occipital lobe and ex vacuo dilatation of the occipital horn. 3. Right occipital craniectomy changes. Electronically Signed   By: Wyatt Mage  Ahmed D.O.   On: 07/28/2022 23:17   DG Chest Portable 1 View  Result Date: 07/28/2022 CLINICAL DATA:  Lethargy and  seizures EXAM: PORTABLE CHEST 1 VIEW COMPARISON:  07/18/2022 FINDINGS: Stable cardiomediastinal silhouette. Aortic atherosclerotic calcification. No focal consolidation, pleural effusion, or pneumothorax. No acute osseous abnormality. IMPRESSION: No active disease. Electronically Signed   By: Placido Sou M.D.   On: 07/28/2022 22:13    Procedures Procedures    Medications Ordered in ED Medications  lactated ringers bolus 1,000 mL (1,000 mLs Intravenous New Bag/Given 07/28/22 2222)  acetaminophen (TYLENOL) tablet 650 mg (650 mg Oral Given 07/28/22 2222)    ED Course/ Medical Decision Making/ A&P                           Medical Decision Making Amount and/or Complexity of Data Reviewed Labs: ordered. Radiology: ordered.  Risk OTC drugs.   Medical Decision Making:   IVANELL DESHOTEL is a 66 y.o. female who presented to the ED today with seizure as  detailed above.    Handoff received from EMS.  External chart has been reviewed including prior ED visits, primary care documentation, home medications, prior labs/imaging. Patient's presentation is complicated by their history of seizures.  Patient placed on continuous vitals and telemetry monitoring while in ED which was reviewed periodically.  Complete initial physical exam performed, notably the patient  was resting comfortably, she was febrile on arrival, mild hypotensive (BP during prior ED visits 105/59, 103/85) , vital signs o/w stable.    Reviewed and confirmed nursing documentation for past medical history, family history, social history.  Vital signs were reviewed.   Initial Assessment:   This patient presents to the ED with chief complaint(s) of seizure, fatigue with pertinent past medical history of seizures which further complicates the presenting complaint. The complaint involves an extensive differential diagnosis and also carries with it a high risk of complications and morbidity.  Serious etiology was considered. Ddx  includes but is not limited to: Differential diagnosis for adult fever includes but is not exclusive to community-acquired pneumonia, urinary tract infection, acute cholecystitis, viral syndrome, cellulitis, tick bourne disease,  decubitus ulcer, necrotizing fasciitis, meningitis, encephalitis, influenza, etc.  Differential diagnoses for altered mental status includes but is not exclusive to alcohol, illicit or prescription medications, intracranial pathology such as stroke, intracerebral hemorrhage, fever or infectious causes including sepsis, hypoxemia, uremia, trauma, endocrine related disorders such as diabetes, hypoglycemia, thyroid-related diseases, etc.  This is most consistent with an acute life/limb threatening illness complicated by underlying chronic conditions.  Initial Plan:  Screening labs/imaging, ivf, apap, seizure precautions   Screening labs including CBC and Metabolic panel to evaluate for infectious or metabolic etiology of disease.  Urinalysis with reflex culture ordered to evaluate for UTI or relevant urologic/nephrologic pathology.  CXR to evaluate for structural/infectious intrathoracic pathology.  EKG to evaluate for cardiac pathology Objective evaluation as below reviewed   Initial Study Results:   Laboratory  All laboratory results reviewed without evidence of clinically relevant pathology.   Exceptions include: glucose mildly elevated, no dka    EKG was reviewed independently.   Radiology:   I independently visualized the following imaging with scope of interpretation limited to determining acute life threatening conditions related to emergency care: CXR, CTH, which revealed no acute process. I agree with radiologist interpretation.   CT Head Wo Contrast  Result Date: 07/28/2022 CLINICAL DATA:  Head trauma, moderate-to-severe. Patient had witnessed seizures around  1,600. EXAM: CT HEAD WITHOUT CONTRAST TECHNIQUE: Contiguous axial images were obtained from the base  of the skull through the vertex without intravenous contrast. RADIATION DOSE REDUCTION: This exam was performed according to the departmental dose-optimization program which includes automated exposure control, adjustment of the mA and/or kV according to patient size and/or use of iterative reconstruction technique. COMPARISON:  CT head dated July 18, 2022 FINDINGS: Brain: No evidence of acute infarction, hemorrhage, hydrocephalus, extra-axial collection or mass lesion/mass effect. Encephalomalacia of the right occipital lobe and ex vacuo dilatation of the occipital horn. Vascular: No hyperdense vessel or unexpected calcification. Skull: Right occipital craniectomy changes. No acute fracture or significant soft tissue changes. Sinuses/Orbits: No acute finding. Other: None. IMPRESSION: 1. No acute intracranial abnormality. 2. Encephalomalacia of the right occipital lobe and ex vacuo dilatation of the occipital horn. 3. Right occipital craniectomy changes. Electronically Signed   By: Keane Police D.O.   On: 07/28/2022 23:17   DG Chest Portable 1 View  Result Date: 07/28/2022 CLINICAL DATA:  Lethargy and seizures EXAM: PORTABLE CHEST 1 VIEW COMPARISON:  07/18/2022 FINDINGS: Stable cardiomediastinal silhouette. Aortic atherosclerotic calcification. No focal consolidation, pleural effusion, or pneumothorax. No acute osseous abnormality. IMPRESSION: No active disease. Electronically Signed   By: Placido Sou M.D.   On: 07/28/2022 22:13   CT [name]  Result Date: 07/18/2022 CLINICAL DATA:  Low back pain after fall 1 week ago EXAM: CT LUMBAR SPINE WITHOUT CONTRAST TECHNIQUE: Multidetector CT imaging of the lumbar spine was performed without intravenous contrast administration. Multiplanar CT image reconstructions were also generated. RADIATION DOSE REDUCTION: This exam was performed according to the departmental dose-optimization program which includes automated exposure control, adjustment  of the mA and/or kV according to patient size and/or use of iterative reconstruction technique. COMPARISON:  CT 04/28/2022 FINDINGS: Segmentation: 5 lumbar type vertebrae. Alignment: No traumatic listhesis. Vertebrae: No acute fracture. Unchanged mild physiologic wedging of the T11, T12, and L1 vertebrae. No lytic or sclerotic bone lesion. Paraspinal and other soft tissues: Aortic atherosclerosis. No acute findings are seen. Disc levels: Similar degree of mild multilevel lumbar spondylosis. No evidence of high-grade foraminal or canal stenosis. IMPRESSION: 1. No acute fracture or traumatic listhesis of the lumbar spine. 2. Similar degree of mild multilevel lumbar spondylosis. 3. Aortic atherosclerosis (ICD10-I70.0). Electronically Signed   By: Davina Poke D.O.   On: 07/18/2022 16:32   CT Head Wo Contrast  Result Date: 07/18/2022 CLINICAL DATA:  Neck trauma EXAM: CT HEAD WITHOUT CONTRAST CT CERVICAL SPINE WITHOUT CONTRAST TECHNIQUE: Multidetector CT imaging of the head and cervical spine was performed following the standard protocol without intravenous contrast. Multiplanar CT image reconstructions of the cervical spine were also generated. RADIATION DOSE REDUCTION: This exam was performed according to the departmental dose-optimization program which includes automated exposure control, adjustment of the mA and/or kV according to patient size and/or use of iterative reconstruction technique. COMPARISON:  CT Head 08/10/20 FINDINGS: CT HEAD FINDINGS Brain: Postsurgical changes from a right parietal craniotomy with slightly sunken appearance of the craniotomy flap, unchanged from prior exam. There encephalomalacia in the right occipital lobe, unchanged from prior exam. No evidence of an infarct. No hemorrhage. No hydrocephalus. There is ex vacuo dilatation of the right occipital horn, unchanged from prior exam. Vascular: No hyperdense vessel or unexpected calcification. Skull: Right parietal craniotomy.  Sinuses/Orbits: No acute finding. Other: None. CT CERVICAL SPINE FINDINGS Alignment: Trace retrolisthesis of C3 on C4. Skull base and vertebrae: No acute fracture. No primary bone  lesion or focal pathologic process. Soft tissues and spinal canal: No prevertebral fluid or swelling. No visible canal hematoma. Disc levels:  No evidence of high-grade spinal canal stenosis. Upper chest: Moderate centrilobular emphysema. Other: None IMPRESSION: 1. No acute intracranial abnormality. 2. No acute cervical spine fracture. Emphysema (ICD10-J43.9). Electronically Signed   By: Marin Roberts M.D.   On: 07/18/2022 16:26   CT Cervical Spine Wo Contrast  Result Date: 07/18/2022 CLINICAL DATA:  Neck trauma EXAM: CT HEAD WITHOUT CONTRAST CT CERVICAL SPINE WITHOUT CONTRAST TECHNIQUE: Multidetector CT imaging of the head and cervical spine was performed following the standard protocol without intravenous contrast. Multiplanar CT image reconstructions of the cervical spine were also generated. RADIATION DOSE REDUCTION: This exam was performed according to the departmental dose-optimization program which includes automated exposure control, adjustment of the mA and/or kV according to patient size and/or use of iterative reconstruction technique. COMPARISON:  CT Head 08/10/20 FINDINGS: CT HEAD FINDINGS Brain: Postsurgical changes from a right parietal craniotomy with slightly sunken appearance of the craniotomy flap, unchanged from prior exam. There encephalomalacia in the right occipital lobe, unchanged from prior exam. No evidence of an infarct. No hemorrhage. No hydrocephalus. There is ex vacuo dilatation of the right occipital horn, unchanged from prior exam. Vascular: No hyperdense vessel or unexpected calcification. Skull: Right parietal craniotomy. Sinuses/Orbits: No acute finding. Other: None. CT CERVICAL SPINE FINDINGS Alignment: Trace retrolisthesis of C3 on C4. Skull base and vertebrae: No acute fracture. No primary bone  lesion or focal pathologic process. Soft tissues and spinal canal: No prevertebral fluid or swelling. No visible canal hematoma. Disc levels:  No evidence of high-grade spinal canal stenosis. Upper chest: Moderate centrilobular emphysema. Other: None IMPRESSION: 1. No acute intracranial abnormality. 2. No acute cervical spine fracture. Emphysema (ICD10-J43.9). Electronically Signed   By: Marin Roberts M.D.   On: 07/18/2022 16:26   DG Chest 2 View  Result Date: 07/18/2022 CLINICAL DATA:  Shortness of breath EXAM: CHEST - 2 VIEW COMPARISON:  Radiograph 04/27/2022 FINDINGS: Unchanged cardiomediastinal silhouette. There is no focal airspace consolidation. Pulmonary hyperinflation. There is no pleural effusion or evidence of pneumothorax. Thoracic spondylosis. Chronic right distal clavicle injury. No acute osseous abnormality. Partially visualized IVC filter. IMPRESSION: Findings of COPD.  No focal airspace disease. Electronically Signed   By: Maurine Simmering M.D.   On: 07/18/2022 14:23    Cardiac monitoring was reviewed and interpreted by myself which shows nsr    Consults: Case discussed with na.   ED course:     Reassessment: Pt is feeling better, she has mild cough but not productive, CXR w/o PNA, RVP was negative. She had fever on arrival, unclear source but she does not appear to be septic. I do favor viral infection at this time. Pending UA at shift change.   Final Assessment and Plan:   Pt with seizure, she has hx of seizures, she is back to her baseline currently. No further seizures after arrival, no seizure activity on arrival. She was febrile, unclear source at this time, UA pending at shift change.  She did have drop in her Hgb from her baseline, denies any bleeding. Advised her to f/u with pcp for recheck on hemoglobin in the next 3-5 days. She is HDS at her typical blood pressure level. Breathing comfortably on ambient air. Signed out to incoming edp pending ua and recheck, if she remains at  her baseline then can likely be discharged home.    Admission was considered  Clinical Impression:  1. Seizure ([place])   2. Fever, unspecified fever cause      Data Unavailable       Social Determinants of health: Social History   Tobacco Use   Smoking status: Former    Packs/day: 0.50    Years: 20.00    Total pack years: 10.00    Types: Cigarettes    Quit date: 10/26/2017    Years since quitting: 4.7   Smokeless tobacco: Never   Tobacco comments:    Previously smoked 2 ppd, down to 4 cigs/day  Vaping Use   Vaping Use: Never used  Substance Use Topics   Alcohol use: No   Drug use: No                  This chart was dictated using voice recognition software.  Despite best efforts to proofread,  errors can occur which can change the documentation meaning.         Final Clinical Impression(s) / ED Diagnoses Final diagnoses:  Seizure ([place])  Fever, unspecified fever cause    Rx / DC Orders ED Discharge Orders     None         Jeanell Sparrow, DO 07/28/22 2356

## 2022-07-28 NOTE — ED Triage Notes (Signed)
BIB [facility] from home for lethargy and seizures. Pt had a witnessed seizure around 1600, 3 hours later pt still too lethargic per daughter. Hx of brain sx, COPD, hypotension

## 2022-07-29 ENCOUNTER — Emergency Department (HOSPITAL_COMMUNITY): Payer: Medicare Other

## 2022-07-29 DIAGNOSIS — G629 Polyneuropathy, unspecified: Secondary | ICD-10-CM | POA: Diagnosis present

## 2022-07-29 DIAGNOSIS — E876 Hypokalemia: Secondary | ICD-10-CM | POA: Diagnosis present

## 2022-07-29 DIAGNOSIS — Z23 Encounter for immunization: Secondary | ICD-10-CM | POA: Diagnosis present

## 2022-07-29 DIAGNOSIS — Z1152 Encounter for screening for COVID-19: Secondary | ICD-10-CM | POA: Diagnosis not present

## 2022-07-29 DIAGNOSIS — Z888 Allergy status to other drugs, medicaments and biological substances status: Secondary | ICD-10-CM | POA: Diagnosis not present

## 2022-07-29 DIAGNOSIS — I480 Paroxysmal atrial fibrillation: Secondary | ICD-10-CM | POA: Diagnosis present

## 2022-07-29 DIAGNOSIS — Z885 Allergy status to narcotic agent status: Secondary | ICD-10-CM | POA: Diagnosis not present

## 2022-07-29 DIAGNOSIS — N393 Stress incontinence (female) (male): Secondary | ICD-10-CM | POA: Diagnosis present

## 2022-07-29 DIAGNOSIS — R7989 Other specified abnormal findings of blood chemistry: Secondary | ICD-10-CM | POA: Diagnosis not present

## 2022-07-29 DIAGNOSIS — J440 Chronic obstructive pulmonary disease with acute lower respiratory infection: Secondary | ICD-10-CM | POA: Diagnosis present

## 2022-07-29 DIAGNOSIS — Z86718 Personal history of other venous thrombosis and embolism: Secondary | ICD-10-CM | POA: Diagnosis not present

## 2022-07-29 DIAGNOSIS — M47816 Spondylosis without myelopathy or radiculopathy, lumbar region: Secondary | ICD-10-CM | POA: Diagnosis present

## 2022-07-29 DIAGNOSIS — R251 Tremor, unspecified: Secondary | ICD-10-CM | POA: Diagnosis present

## 2022-07-29 DIAGNOSIS — Z8673 Personal history of transient ischemic attack (TIA), and cerebral infarction without residual deficits: Secondary | ICD-10-CM | POA: Diagnosis not present

## 2022-07-29 DIAGNOSIS — Z9102 Food additives allergy status: Secondary | ICD-10-CM | POA: Diagnosis not present

## 2022-07-29 DIAGNOSIS — K429 Umbilical hernia without obstruction or gangrene: Secondary | ICD-10-CM | POA: Diagnosis present

## 2022-07-29 DIAGNOSIS — R569 Unspecified convulsions: Secondary | ICD-10-CM

## 2022-07-29 DIAGNOSIS — J441 Chronic obstructive pulmonary disease with (acute) exacerbation: Secondary | ICD-10-CM | POA: Diagnosis present

## 2022-07-29 DIAGNOSIS — D696 Thrombocytopenia, unspecified: Secondary | ICD-10-CM | POA: Diagnosis present

## 2022-07-29 DIAGNOSIS — W1830XD Fall on same level, unspecified, subsequent encounter: Secondary | ICD-10-CM | POA: Diagnosis not present

## 2022-07-29 DIAGNOSIS — E538 Deficiency of other specified B group vitamins: Secondary | ICD-10-CM | POA: Diagnosis present

## 2022-07-29 DIAGNOSIS — E441 Mild protein-calorie malnutrition: Secondary | ICD-10-CM | POA: Diagnosis present

## 2022-07-29 DIAGNOSIS — G8929 Other chronic pain: Secondary | ICD-10-CM | POA: Diagnosis present

## 2022-07-29 DIAGNOSIS — G40909 Epilepsy, unspecified, not intractable, without status epilepticus: Secondary | ICD-10-CM | POA: Diagnosis present

## 2022-07-29 DIAGNOSIS — J209 Acute bronchitis, unspecified: Secondary | ICD-10-CM | POA: Diagnosis present

## 2022-07-29 DIAGNOSIS — Z87891 Personal history of nicotine dependence: Secondary | ICD-10-CM | POA: Diagnosis not present

## 2022-07-29 DIAGNOSIS — K219 Gastro-esophageal reflux disease without esophagitis: Secondary | ICD-10-CM | POA: Diagnosis present

## 2022-07-29 LAB — RAPID URINE DRUG SCREEN, HOSP PERFORMED
Amphetamines: NOT DETECTED
Barbiturates: POSITIVE — AB
Benzodiazepines: POSITIVE — AB
Cocaine: NOT DETECTED
Opiates: NOT DETECTED
Tetrahydrocannabinol: NOT DETECTED

## 2022-07-29 LAB — URINALYSIS, ROUTINE W REFLEX MICROSCOPIC
Bacteria, UA: NONE SEEN
Glucose, UA: NEGATIVE mg/dL
Ketones, ur: NEGATIVE mg/dL
Leukocytes,Ua: NEGATIVE
Nitrite: NEGATIVE
Protein, ur: 30 mg/dL — AB
Specific Gravity, Urine: 1.03 (ref 1.005–1.030)
pH: 6 (ref 5.0–8.0)

## 2022-07-29 LAB — MAGNESIUM: Magnesium: 1.9 mg/dL (ref 1.7–2.4)

## 2022-07-29 LAB — PHOSPHORUS: Phosphorus: 2.1 mg/dL — ABNORMAL LOW (ref 2.5–4.6)

## 2022-07-29 MED ORDER — PHENYTOIN SODIUM EXTENDED 100 MG PO CAPS
100.0000 mg | ORAL_CAPSULE | Freq: Every day | ORAL | Status: DC
  Administered 2022-07-29 – 2022-08-06 (×9): 100 mg via ORAL
  Filled 2022-07-29 (×10): qty 1

## 2022-07-29 MED ORDER — OXYBUTYNIN CHLORIDE ER 5 MG PO TB24
5.0000 mg | ORAL_TABLET | Freq: Every day | ORAL | Status: DC
  Administered 2022-07-29 – 2022-08-05 (×8): 5 mg via ORAL
  Filled 2022-07-29 (×9): qty 1

## 2022-07-29 MED ORDER — PHENYTOIN SODIUM EXTENDED 100 MG PO CAPS: 100.0000 mg | ORAL_CAPSULE | Freq: Every day | ORAL | Status: DC

## 2022-07-29 MED ORDER — ACETAMINOPHEN 650 MG RE SUPP: 650.0000 mg | Freq: Four times a day (QID) | RECTAL | Status: DC | PRN

## 2022-07-29 MED ORDER — PHENYTOIN SODIUM EXTENDED 30 MG PO CAPS
130.0000 mg | ORAL_CAPSULE | Freq: Every day | ORAL | Status: DC
  Administered 2022-07-29 – 2022-08-05 (×8): 130 mg via ORAL
  Filled 2022-07-29 (×10): qty 1

## 2022-07-29 MED ORDER — ONDANSETRON HCL 4 MG/2ML IJ SOLN
4.0000 mg | Freq: Four times a day (QID) | INTRAMUSCULAR | Status: DC | PRN
  Administered 2022-07-31: 4 mg via INTRAVENOUS
  Filled 2022-07-29: qty 2

## 2022-07-29 MED ORDER — METHYLPREDNISOLONE SODIUM SUCC 125 MG IJ SOLR
125.0000 mg | Freq: Once | INTRAMUSCULAR | Status: AC
  Administered 2022-07-29: 125 mg via INTRAVENOUS
  Filled 2022-07-29: qty 2

## 2022-07-29 MED ORDER — SODIUM CHLORIDE (PF) 0.9 % IJ SOLN
INTRAMUSCULAR | Status: AC
  Filled 2022-07-29: qty 50

## 2022-07-29 MED ORDER — SODIUM CHLORIDE 0.9 % IV SOLN
1.0000 g | Freq: Once | INTRAVENOUS | Status: AC
  Administered 2022-07-29: 1 g via INTRAVENOUS
  Filled 2022-07-29 (×2): qty 10

## 2022-07-29 MED ORDER — DOXYCYCLINE HYCLATE 100 MG PO TABS
100.0000 mg | ORAL_TABLET | Freq: Once | ORAL | Status: AC
  Administered 2022-07-29: 100 mg via ORAL
  Filled 2022-07-29: qty 1

## 2022-07-29 MED ORDER — POTASSIUM CHLORIDE IN NACL 20-0.9 MEQ/L-% IV SOLN
INTRAVENOUS | Status: AC
  Filled 2022-07-29 (×3): qty 1000

## 2022-07-29 MED ORDER — PHENYTOIN SODIUM EXTENDED 30 MG PO CAPS: 30.0000 mg | ORAL_CAPSULE | Freq: Every day | ORAL | Status: DC

## 2022-07-29 MED ORDER — ENOXAPARIN SODIUM 40 MG/0.4ML IJ SOSY
40.0000 mg | PREFILLED_SYRINGE | INTRAMUSCULAR | Status: DC
  Administered 2022-07-29 – 2022-08-06 (×9): 40 mg via SUBCUTANEOUS
  Filled 2022-07-29 (×9): qty 0.4

## 2022-07-29 MED ORDER — ALUM & MAG HYDROXIDE-SIMETH 200-200-20 MG/5ML PO SUSP
30.0000 mL | Freq: Four times a day (QID) | ORAL | Status: DC | PRN
  Administered 2022-07-29 – 2022-08-03 (×3): 30 mL via ORAL
  Filled 2022-07-29 (×3): qty 30

## 2022-07-29 MED ORDER — GABAPENTIN 300 MG PO CAPS
600.0000 mg | ORAL_CAPSULE | Freq: Every day | ORAL | Status: DC
  Administered 2022-07-29 – 2022-08-05 (×8): 600 mg via ORAL
  Filled 2022-07-29 (×8): qty 2

## 2022-07-29 MED ORDER — IOHEXOL 300 MG/ML  SOLN
100.0000 mL | Freq: Once | INTRAMUSCULAR | Status: AC | PRN
  Administered 2022-07-29: 100 mL via INTRAVENOUS

## 2022-07-29 MED ORDER — FENTANYL CITRATE PF 50 MCG/ML IJ SOSY
50.0000 ug | PREFILLED_SYRINGE | Freq: Once | INTRAMUSCULAR | Status: AC
  Administered 2022-07-29: 50 ug via INTRAVENOUS
  Filled 2022-07-29: qty 1

## 2022-07-29 MED ORDER — LORAZEPAM 2 MG/ML IJ SOLN: 1.0000 mg | INTRAMUSCULAR | Status: DC | PRN

## 2022-07-29 MED ORDER — IPRATROPIUM-ALBUTEROL 0.5-2.5 (3) MG/3ML IN SOLN: 3.0000 mL | RESPIRATORY_TRACT | Status: DC | PRN

## 2022-07-29 MED ORDER — PHENYTOIN SODIUM EXTENDED 100 MG PO CAPS
100.0000 mg | ORAL_CAPSULE | Freq: Once | ORAL | Status: AC
  Administered 2022-07-29: 100 mg via ORAL
  Filled 2022-07-29: qty 1

## 2022-07-29 MED ORDER — INFLUENZA VAC SPLIT QUAD 0.5 ML IM SUSY
0.5000 mL | PREFILLED_SYRINGE | INTRAMUSCULAR | Status: AC
  Administered 2022-08-05: 0.5 mL via INTRAMUSCULAR
  Filled 2022-07-29: qty 0.5

## 2022-07-29 MED ORDER — KETOROLAC TROMETHAMINE 30 MG/ML IJ SOLN
30.0000 mg | Freq: Once | INTRAMUSCULAR | Status: AC
  Administered 2022-07-29: 30 mg via INTRAVENOUS
  Filled 2022-07-29: qty 1

## 2022-07-29 MED ORDER — LACOSAMIDE 50 MG PO TABS
200.0000 mg | ORAL_TABLET | Freq: Two times a day (BID) | ORAL | Status: DC
  Administered 2022-07-29 – 2022-08-06 (×17): 200 mg via ORAL
  Filled 2022-07-29 (×17): qty 4

## 2022-07-29 MED ORDER — IPRATROPIUM-ALBUTEROL 0.5-2.5 (3) MG/3ML IN SOLN
3.0000 mL | Freq: Once | RESPIRATORY_TRACT | Status: AC
  Administered 2022-07-29: 3 mL via RESPIRATORY_TRACT
  Filled 2022-07-29: qty 3

## 2022-07-29 MED ORDER — CENOBAMATE 150 MG PO TABS
150.0000 mg | ORAL_TABLET | Freq: Every day | ORAL | Status: DC
  Administered 2022-07-30 – 2022-08-05 (×7): 150 mg via ORAL
  Filled 2022-07-29 (×2): qty 1

## 2022-07-29 MED ORDER — ACETAMINOPHEN 325 MG PO TABS
650.0000 mg | ORAL_TABLET | Freq: Four times a day (QID) | ORAL | Status: DC | PRN
  Administered 2022-07-30 – 2022-08-06 (×4): 650 mg via ORAL
  Filled 2022-07-29 (×5): qty 2

## 2022-07-29 MED ORDER — ONDANSETRON HCL 4 MG PO TABS: 4.0000 mg | ORAL_TABLET | Freq: Four times a day (QID) | ORAL | Status: DC | PRN

## 2022-07-29 NOTE — ED Notes (Signed)
Lunch tray given. 

## 2022-07-29 NOTE — H&P (Signed)
History and Physical    Patient: [name] [id number] DOB: Dec 16, 1955 DOA: 07/28/2022 DOS: the patient was seen and examined on 07/29/2022 PCP: [name], MD  Patient coming from: Home  Chief Complaint:  Chief Complaint  Patient presents with   Seizures   HPI: [name] is a 66 y.o. female with medical history significant of asthma, COPD, cerebral hemorrhage, chronic back pain, DDD, DVT, GERD, headaches, history of PUD, history of GI bleed, history of blood transfusion, nephrolithiasis, peripheral neuropathy, tunnel vision who has been brought to the emergency department due to lethargy and seizures at home.  She had a seizure around 1600 yesterday and was still very lethargic in the evening.  She urinated on herself according to the patient's daughter.  The patient stated she did not feel well yesterday.  She has been having frontal/occipital headache, nonproductive cough with pleuritic CP, mild sore throat, nasal congestion and sneezing.  She has has stress incontinence when coughing or sneezing.  No travel history or sick contacts.  No fever, chills or night sweats. No sore throat, rhinorrhea, dyspnea, wheezing or hemoptysis.  She quit smoking 5 years ago after one of her relatives in [place] had a very serious health issues secondary to smoking.  No palpitations, diaphoresis, PND, orthopnea or pitting edema of the lower extremities.  No appetite changes, abdominal pain, diarrhea, constipation, melena or hematochezia.  No flank pain, dysuria, frequency or hematuria.  No polyuria, polydipsia, polyphagia or blurred vision.  ED course: Initial vital signs were temperature 103.1 F, pulse 99, respirations 20, blood pressure 96/59 mmHg O2 sat 100% on room air.  The patient received acetaminophen 650 mg p.o. x 1, ceftriaxone 1 g IVPB, doxycycline 1 capsule p.o., fentanyl 50 mcg IVP, DuoNeb, Dilantin ER 100 mg p.o. x 1 and 1000 mL of LR bolus.  Lab work: Urinalysis with small  hemoglobinuria, small bilirubinuria and proteinuria 30 mg/dL, the rest of the measurements were unremarkable.  Lactic acid, lipase, total CK and magnesium were normal.  Venous blood gas showed normal pH and pCO2, pO2 was less than 31 mmHg, bicarbonate 33.1 and acid-base excess 6.5 mmol/L.  CBC showed a white count of 7.5 with 75% neutrophils, hemoglobin 11.8 g deciliter platelets 142.  CMP showed a glucose of 121 mg deciliter and albumin of 3.3 g/dL.  The rest of the CMP measurements were normal after calcium correction.  Imaging: Portable 1 chest radiograph with no active disease.  CT head without contrast with no acute intracranial normality.  There is encephalomalacia of the right occipital lobe and a spots "dilated Tatian of the occipital horn.  Right occipital craniectomy changes.   Review of Systems: As mentioned in the history of present illness. All other systems reviewed and are negative.  Past Medical History:  Diagnosis Date   Asthma    Cerebral hemorrhage ([place]) 1989   Chronic back pain    COPD (chronic obstructive pulmonary disease) (HCC)    DDD (degenerative disc disease) 06/12/2013   DVT (deep venous thrombosis) (HCC)    "she's had several since 1990"   GERD (gastroesophageal reflux disease)    Headache    "usually around time when she's had a seizure"   History of blood transfusion    "when she was a baby"   History of stomach ulcers    Kidney stones    Neuropathy    Seizures ([place])    "because of her brain surgery"   Stroke [facility]) 1990's   family denies residual  on 11/09/2014   Tunnel vision    "since brain OR"   Past Surgical History:  Procedure Laterality Date   [place]   craniotomy with hematoma evacuation   [place]; 1987; [place]     IVC FILTER INSERTION N/A 10/12/2020   Procedure: IVC FILTER INSERTION;  Surgeon: Angelia Mould, MD;  Location: [facility];  Service: Cardiovascular;  Laterality: N/A;    TUBAL LIGATION  1990's   Social History:  reports that she quit smoking about 4 years ago. Her smoking use included cigarettes. She has a 10.00 pack-year smoking history. She has never used smokeless tobacco. She reports that she does not drink alcohol and does not use drugs.  Allergies  Allergen Reactions   Zonisamide Other (See Comments)    Numbness and tingling over whole body   Chocolate Flavor Other (See Comments)    Trigger for seizures   Codeine Nausea And Vomiting   Keppra [Levetiracetam] Other (See Comments)    Causes seizures   Pregabalin Nausea And Vomiting   Olive Oil Rash    Family History  Problem Relation Age of Onset   Heart attack Mother        4 MIs, started in her 53s, also vaginal cancer and colon cancer   Cancer Mother    Heart attack Father        Died suddenly age 34 - autopsy revealed heart attack her patient   Cancer Brother        Sinus cancer    Prior to Admission medications   Medication Sig Start Date End Date Taking? Authorizing Provider  acetaminophen (TYLENOL) 500 MG tablet Take 1,000 mg by mouth every 6 (six) hours as needed for headache.   Yes [provider]  Cenobamate (XCOPRI) 150 MG TABS Take 1 tablet every night 05/20/22  Yes [name], MD  DILANTIN 100 MG ER capsule TAKE 1 CAPSULE BY MOUTH TWICE DAILY 05/20/22  Yes [name], MD  DILANTIN 30 MG ER capsule TAKE 1 CAPSULE BY MOUTH NIGHTLY ALONG WITH 100 MG CAPSULE FOR A TOTAL NIGHTLY DOSE OF '130MG'$  05/20/22  Yes [name], MD  gabapentin (NEURONTIN) 300 MG capsule Take 2 capsule every night Patient taking differently: Take 300 mg by mouth at bedtime. 05/20/22  Yes [name], MD  LORazepam (ATIVAN) 1 MG tablet TAKE 1 TABLET BY MOUTH AS DIRECTED AS NEEDED FOR 3 OR MORE SEIZURES IN 24 HOURS. DO NOT TAKE MORE THAN 2 TABLETS IN 24 HOURS. 02/19/22  Yes [name], MD  VIMPAT 200 MG TABS tablet Take 1 tablet (200 mg total) by mouth 2 (two) times daily. 05/20/22   Yes [name], MD  cholecalciferol (VITAMIN D3) 25 MCG (1000 UNIT) tablet Take 1 tablet (1,000 Units total) by mouth daily. Patient not taking: Reported on 05/20/2022 04/02/21   [name], MD  cyanocobalamin ([employer] B-12) 500 MCG tablet Take 2 tablets (1,000 mcg total) by mouth daily. Patient not taking: Reported on 07/29/2022 05/20/22   [name], MD  etodolac (LODINE) 300 MG capsule Take 1 capsule (300 mg total) by mouth every 8 (eight) hours. Patient not taking: Reported on 07/29/2022 07/18/22   Dorie Rank, MD  folic acid (FOLVITE) 1 MG tablet Take 1 tablet (1 mg total) by mouth daily. Patient not taking: Reported on 07/29/2022 05/20/22   [name], MD    Physical Exam: Vitals:  07/29/22 0300 07/29/22 0410 07/29/22 0415 07/29/22 0558  BP: (!) 113/59  (!) 104/49 116/69  Pulse: 89  89 90  Resp: (!) 23  (!) 21 (!) 21  Temp:  99.7 F (37.6 C)    TempSrc:  Oral  Oral  SpO2: 92%  98% 100%  Weight:      Height:       Physical Exam Vitals and nursing note reviewed.  Constitutional:      General: She is awake. She is not in acute distress.    Appearance: She is normal weight.     Comments: Chronically ill-appearing.  HENT:     Head: Normocephalic.     Nose: No rhinorrhea.     Mouth/Throat:     Mouth: Mucous membranes are moist.  Eyes:     General: No scleral icterus.    Pupils: Pupils are equal, round, and reactive to light.  Neck:     Vascular: No JVD.  Cardiovascular:     Rate and Rhythm: Normal rate and regular rhythm.     Heart sounds: S1 normal and S2 normal.  Pulmonary:     Effort: Pulmonary effort is normal. No tachypnea or accessory muscle usage.     Breath sounds: Wheezing present. No decreased breath sounds, rhonchi or rales.  Abdominal:     General: Bowel sounds are normal. There is no distension.     Palpations: Abdomen is soft.     Tenderness: There is no abdominal tenderness. There is no guarding.  Musculoskeletal:     Cervical back:  Neck supple.     Right lower leg: No edema.     Left lower leg: No edema.  Skin:    General: Skin is warm and dry.  Neurological:     General: No focal deficit present.     Mental Status: She is alert and oriented to person, place, and time.     Motor: Tremor present.     Comments: Tremor at baseline.  Psychiatric:        Mood and Affect: Mood normal.        Behavior: Behavior normal. Behavior is cooperative.     Data Reviewed:  Results are pending, will review when available.  Assessment and Plan: Principal Problem:   Seizures ([place]) Following with [name] at the clinic. Admit to PCU/inpatient. Seizure precautions. Dilantin 100 mg in the a.m. and 130 mg in the p.m. Vimpat 200 twice daily. Cenobamate 150 at bedtime. Gabapentin 600 mg at night. Lorazepam 1 mg every 4 hours as needed. Discussed with neurology on-call. Consult if the patient has recurrent seizures. May need to be transferred to [facility] for EEG monitoring.  Active Problems:   Fever Likely due to viral syndrome. Awaiting respiratory pathogen PCR results. Aggressive management of fever.    COPD (chronic obstructive pulmonary disease) (HCC) Received methylprednisolone this morning. No wheezing at this time. Will defer further use of glucocorticoids for now. DuoNeb every 4 hours as needed.    Tremor At baseline. Continue current medication regimen.    Thrombocytopenia- chronic Monitor platelet count.    Protein calorie malnutrition, mild (HCC) Protein supplementation.   Monitor albumin level. Consider nutritional services evaluation if he worsens.    GERD (gastroesophageal reflux disease) Famotidine while in the hospital.    PAF (paroxysmal atrial fibrillation) (HCC) No recent palpitations or A-fib episodes per patient. Not on anticoagulation (history of ICH).    Stress incontinence Oxybutynin trial at bedtime. Advised to do Kegel's exercises multiple times  a day.    Advance Care Planning:    Code Status: Full Code   Consults:   Family Communication:   Severity of Illness: The appropriate patient status for this patient is INPATIENT. Inpatient status is judged to be reasonable and necessary in order to provide the required intensity of service to ensure the patient's safety. The patient's presenting symptoms, physical exam findings, and initial radiographic and laboratory data in the context of their chronic comorbidities is felt to place them at high risk for further clinical deterioration. Furthermore, it is not anticipated that the patient will be medically stable for discharge from the hospital within 2 midnights of admission.   * I certify that at the point of admission it is my clinical judgment that the patient will require inpatient hospital care spanning beyond 2 midnights from the point of admission due to high intensity of service, high risk for further deterioration and high frequency of surveillance required.*  Author: Reubin Milan, MD 07/29/2022 7:20 AM  For on call review [email or web address].   This document was prepared using Dragon voice recognition software and may contain some unintended transcription errors.

## 2022-07-29 NOTE — Plan of Care (Signed)

## 2022-07-29 NOTE — ED Provider Notes (Signed)
Care assumed at 2330.  Patient with history of seizure disorder here for evaluation following a seizure with prolonged postictal period.  Febrile at time of ED arrival.  Care assumed pending urinalysis.  Additional history available from patient's daughter at the bedside after patient's initial ED evaluation.  Daughter states that there were likely multiple seizures overnight because the patient's bed was wet this morning and that can be an indicator of seizures.  She was slightly confused when she got up but then was at her baseline throughout the day.  Around 4 PM she ate and open gifts was doing well.  Later the daughter checked on her and she felt hot it was incoherent and had wet herself.  Is unclear if there was a seizure at this time but it was thought that she did have a seizure.  She had a prolonged postictal state per daughter.  She continues to not be at her baseline at time of ED assessment.  Daughter states that normally she walks and is active and can do this without difficulty but in the emergency department she cannot stand.  Patient has experienced increased cough over the last several days with increased urination.  She does have a history of asthma and emphysema.  Grandson in the same house does have a febrile illness with runny nose but overall his symptoms are mild and improving. Patient complains of abdominal pain and neck pain that started after seizure.  No headache, chest pain.  On examination she is tachypneic with wheezes bilaterally.  She has generalized abdominal tenderness, greatest over the epigastric region.  She has global weakness.  She was treated with antibiotics for possible community-acquired pneumonia versus COPD exacerbation.  Also concern for viral respiratory infection given her sick contact at home-will send formal respiratory viral panel.  Medicine consulted for admission for ongoing observation and treatment.  Her respiratory status did improve after DuoNeb.    Quintella Reichert, MD 07/29/22 [phone]

## 2022-07-29 NOTE — ED Notes (Signed)
Ambulated pt, pt attempted 3x to stand and was unable to. Pt became very dizzy, weak, and nauseas.

## 2022-07-30 LAB — HIV ANTIBODY (ROUTINE TESTING W REFLEX): HIV Screen 4th Generation wRfx: NONREACTIVE

## 2022-07-30 LAB — LACOSAMIDE: Lacosamide: 6.9 ug/mL (ref 5.0–10.0)

## 2022-07-30 LAB — COMPREHENSIVE METABOLIC PANEL
ALT: 15 U/L (ref 0–44)
AST: 30 U/L (ref 15–41)
Albumin: 3.1 g/dL — ABNORMAL LOW (ref 3.5–5.0)
Alkaline Phosphatase: 56 U/L (ref 38–126)
Anion gap: 8 (ref 5–15)
BUN: 6 mg/dL — ABNORMAL LOW (ref 8–23)
CO2: 26 mmol/L (ref 22–32)
Calcium: 8.7 mg/dL — ABNORMAL LOW (ref 8.9–10.3)
Chloride: 106 mmol/L (ref 98–111)
Creatinine, Ser: 0.74 mg/dL (ref 0.44–1.00)
GFR, Estimated: >60 mL/min (ref >60)
Glucose, Bld: 115 mg/dL — ABNORMAL HIGH (ref 70–99)
Potassium: 3.8 mmol/L (ref 3.5–5.1)
Sodium: 140 mmol/L (ref 135–145)
Total Bilirubin: 0.7 mg/dL (ref 0.3–1.2)
Total Protein: 6.5 g/dL (ref 6.5–8.1)

## 2022-07-30 LAB — PHENYTOIN LEVEL, FREE AND TOTAL
Phenytoin, Free: 0.7 ug/mL — ABNORMAL LOW (ref 1.0–2.0)
Phenytoin, Total: 8.5 ug/mL — ABNORMAL LOW (ref 10.0–20.0)

## 2022-07-30 LAB — RESPIRATORY PANEL BY PCR

## 2022-07-30 LAB — CBC
HCT: 37.3 % (ref 36.0–46.0)
Hemoglobin: 12.2 g/dL (ref 12.0–15.0)
MCH: 34.1 pg — ABNORMAL HIGH (ref 26.0–34.0)
MCHC: 32.7 g/dL (ref 30.0–36.0)
MCV: 104.2 fL — ABNORMAL HIGH (ref 80.0–100.0)
Platelets: 168 10*3/uL (ref 150–400)
RBC: 3.58 MIL/uL — ABNORMAL LOW (ref 3.87–5.11)
RDW: 12.1 % (ref 11.5–15.5)
WBC: 6.1 10*3/uL (ref 4.0–10.5)
nRBC: 0 % (ref 0.0–0.2)

## 2022-07-30 MED ORDER — GUAIFENESIN 100 MG/5ML PO LIQD
5.0000 mL | ORAL | Status: DC | PRN
  Administered 2022-07-30 – 2022-08-02 (×6): 5 mL via ORAL
  Filled 2022-07-30 (×7): qty 10

## 2022-07-30 NOTE — Progress Notes (Signed)
  Transition of Care [facility]) Screening Note   Patient Details  Name: [name] Date of Birth: 1955-11-19   Transition of [facility]) CM/SW Contact:    Dessa Phi, RN Phone Number: 07/30/2022, 12:41 PM    Transition of Care Department [facility]) has reviewed patient and no TOC needs have been identified at this time. We will continue to monitor patient advancement through interdisciplinary progression rounds. If new patient transition needs arise, please place a TOC consult.

## 2022-07-30 NOTE — Progress Notes (Signed)
Consultation Progress Note   Patient: [name] [id number] DOB: Dec 30, 1955 DOA: 07/28/2022 DOS: the patient was seen and examined on 07/30/2022 Primary service: DibiaManfred Shirts, MD  Brief hospital course: 66 y.o. female with medical history significant of asthma, COPD, cerebral hemorrhage, chronic back pain, DDD, DVT, GERD, headaches, history of PUD, history of GI bleed, history of blood transfusion, nephrolithiasis, peripheral neuropathy, tunnel vision who has been brought to the emergency department due to lethargy and seizures at home.  She had a seizure around 1600 yesterday and was still very lethargic in the evening.  She urinated on herself according to the patient's daughter.  The patient stated she did not feel well yesterday.  She has been having frontal/occipital headache, nonproductive cough with pleuritic CP, mild sore throat, nasal congestion and sneezing.   Assessment and Plan: Principal Problem:   Seizures ([place]) Following with [name] at the clinic. Admit to PCU/inpatient. Seizure precautions. Dilantin 100 mg in the a.m. and 130 mg in the p.m. Vimpat 200 twice daily. Cenobamate 150 at bedtime. Gabapentin 600 mg at night. Lorazepam 1 mg every 4 hours as needed. Discussed with neurology on-call. Would Consult if the patient has recurrent seizures. May need to be transferred to [facility] for EEG monitoring.   Active Problems:   Fever-Improved Likely due to viral syndrome. Negative respiratory pathogen PCR results.      COPD (chronic obstructive pulmonary disease) (HCC) Received methylprednisolone this morning. No wheezing at this time. Will defer further use of glucocorticoids for now. DuoNeb every 4 hours as needed.     Tremor At baseline. Continue current medication regimen.     Thrombocytopenia- chronic Monitor platelet count.     Protein calorie malnutrition, mild (HCC) Protein supplementation.   Monitor albumin level. Consider nutritional services  evaluation if he worsens.     GERD (gastroesophageal reflux disease) Famotidine while in the hospital.     PAF (paroxysmal atrial fibrillation) (HCC) No recent palpitations or A-fib episodes per patient. Not on anticoagulation (history of ICH).     Stress incontinence Oxybutynin trial at bedtime. Advised to do Kegel's exercises multiple times a day.      Advance Care Planning:   Code Status: Full Code          TRH will continue to follow the patient.  Subjective: Reports feeling tired, sore throat, productive cough and incontinence with coughing  Physical Exam: Vitals:   07/29/22 1843 07/29/22 2249 07/30/22 0354 07/30/22 0624  BP: 115/62 (!) 127/55 (!) 111/58 (!) 121/104  Pulse: 85 88 88 91  Resp: 18 (!) 22 20 (!) 24  Temp: 97.8 F (36.6 C) 99.4 F (37.4 C) 98.7 F (37.1 C) 98.4 F (36.9 C)  TempSrc: Oral Oral Oral Oral  SpO2: 100% 98% 96% 96%  Weight:      Height:       General: She is awake. She is not in acute distress.    Appearance: She is normal weight.     Comments: Chronically ill-appearing.  HENT:     Head: Normocephalic.     Nose: No rhinorrhea.     Mouth/Throat:     Mouth: Mucous membranes are moist.  Eyes:     General: No scleral icterus.    Pupils: Pupils are equal, round, and reactive to light.  Neck:     Vascular: No JVD.  Cardiovascular:     Rate and Rhythm: Normal rate and regular rhythm.     Heart sounds: S1 normal and S2 normal.  Pulmonary:     Effort: Pulmonary effort is normal. No tachypnea or accessory muscle usage.     Breath sounds: Wheezing present. No decreased breath sounds, rhonchi or rales.  Abdominal:     General: Bowel sounds are normal. There is no distension.     Palpations: Abdomen is soft.     Tenderness: There is no abdominal tenderness. There is no guarding.  Musculoskeletal:     Cervical back: Neck supple.     Right lower leg: No edema.     Left lower leg: No edema.  Skin:    General: Skin is warm and dry.   Neurological:     General: No focal deficit present.     Mental Status: She is alert and oriented to person, place, and time.     Motor: Tremor present.     Comments: Tremor at baseline.  Psychiatric:        Mood and Affect: Mood normal.        Behavior: Behavior normal. Behavior is cooperative.       Data Reviewed:  There are no new results to review at this time.  Family Communication:   Time spent: 15 minutes.  Author: Cristela Felt, MD 07/30/2022 10:59 AM  For on call review [email or web address].

## 2022-07-31 ENCOUNTER — Inpatient Hospital Stay (HOSPITAL_COMMUNITY): Payer: Medicare Other

## 2022-07-31 LAB — CBC
HCT: 33.3 % — ABNORMAL LOW (ref 36.0–46.0)
Hemoglobin: 10.8 g/dL — ABNORMAL LOW (ref 12.0–15.0)
MCH: 34.1 pg — ABNORMAL HIGH (ref 26.0–34.0)
MCHC: 32.4 g/dL (ref 30.0–36.0)
MCV: 105 fL — ABNORMAL HIGH (ref 80.0–100.0)
Platelets: 153 10*3/uL (ref 150–400)
RBC: 3.17 MIL/uL — ABNORMAL LOW (ref 3.87–5.11)
RDW: 12.1 % (ref 11.5–15.5)
WBC: 5.1 10*3/uL (ref 4.0–10.5)
nRBC: 0 % (ref 0.0–0.2)

## 2022-07-31 LAB — BASIC METABOLIC PANEL
Anion gap: 6 (ref 5–15)
BUN: 5 mg/dL — ABNORMAL LOW (ref 8–23)
CO2: 27 mmol/L (ref 22–32)
Calcium: 8.4 mg/dL — ABNORMAL LOW (ref 8.9–10.3)
Chloride: 105 mmol/L (ref 98–111)
Creatinine, Ser: 0.54 mg/dL (ref 0.44–1.00)
GFR, Estimated: >60 mL/min (ref >60)
Glucose, Bld: 112 mg/dL — ABNORMAL HIGH (ref 70–99)
Potassium: 3.4 mmol/L — ABNORMAL LOW (ref 3.5–5.1)
Sodium: 138 mmol/L (ref 135–145)

## 2022-07-31 MED ORDER — HYDROMORPHONE HCL 1 MG/ML IJ SOLN
1.0000 mg | Freq: Once | INTRAMUSCULAR | Status: AC
  Administered 2022-07-31: 1 mg via INTRAVENOUS
  Filled 2022-07-31: qty 1

## 2022-07-31 MED ORDER — POTASSIUM CHLORIDE CRYS ER 20 MEQ PO TBCR
40.0000 meq | EXTENDED_RELEASE_TABLET | Freq: Two times a day (BID) | ORAL | Status: DC
  Administered 2022-07-31 – 2022-08-02 (×6): 40 meq via ORAL
  Filled 2022-07-31 (×6): qty 2

## 2022-07-31 MED ORDER — OXYCODONE-ACETAMINOPHEN 5-325 MG PO TABS
1.0000 | ORAL_TABLET | ORAL | Status: DC | PRN
  Administered 2022-07-31 – 2022-08-02 (×7): 1 via ORAL
  Filled 2022-07-31 (×7): qty 1

## 2022-07-31 NOTE — Progress Notes (Signed)
Mobility Specialist - Progress Note   07/31/22 1400  Mobility  Activity Transferred from bed to chair  Level of Assistance Minimal assist, patient does 75% or more  Assistive Device None  Distance Ambulated (ft) 3 ft  Range of Motion/Exercises Active  Activity Response Tolerated well  Mobility Referral Yes  $Mobility charge 1 Mobility   Pt was found in bed and agreeable to try ambulation. C/o LR back pain while lying and sitting EOB and did not want to try ambulation due to her pain. Was min-A to get up from sitting>standing and contact steady assist to pivot to chair. Had x1 LOB during session when standing beside the bed. At EOS was left on recliner chair with necessities in reach and chair alarm on. RN notified of session.  Ferd Hibbs Mobility Specialist

## 2022-07-31 NOTE — Progress Notes (Signed)
Consultation Progress Note   Patient: [name] [id number] DOB: Jul 01, 1956 DOA: 07/28/2022 DOS: the patient was seen and examined on 07/31/2022 Primary service: DibiaManfred Shirts, MD  Brief hospital course: 66 y.o. female with medical history significant of asthma, COPD, cerebral hemorrhage, chronic back pain, DDD, DVT, GERD, headaches, history of PUD, history of GI bleed, history of blood transfusion, nephrolithiasis, peripheral neuropathy, tunnel vision who has been brought to the emergency department due to lethargy and seizures at home.  She had a seizure around 1600 yesterday and was still very lethargic in the evening.  She urinated on herself according to the patient's daughter.  The patient stated she did not feel well yesterday.  She has been having frontal/occipital headache, nonproductive cough with pleuritic CP, mild sore throat, nasal congestion and sneezing.    Assessment and Plan: Principal Problem:   Seizures ([place]) Following with [name] at the clinic. Admit to PCU/inpatient. Seizure precautions. Dilantin 100 mg in the a.m. and 130 mg in the p.m. Vimpat 200 twice daily. Cenobamate 150 at bedtime. Gabapentin 600 mg at night. Lorazepam 1 mg every 4 hours as needed. Discussed with neurology on-call. Would Consult if the patient has recurrent seizures. May need to be transferred to [facility] for EEG monitoring.   Active Problems:   Fever-Improved Likely due to viral syndrome. Negative respiratory pathogen PCR results.  Pleuritic chest pain - Likely due to persistent coughing -EKG shows no concerns for ischemia -Obtain CXR  Hypokalemia Replete orally       COPD (chronic obstructive pulmonary disease) (HCC) Received methylprednisolone this morning. No wheezing at this time. Will defer further use of glucocorticoids for now. DuoNeb every 4 hours as needed.     Tremor At baseline. Continue current medication regimen.     Thrombocytopenia- chronic Monitor  platelet count.     Protein calorie malnutrition, mild (HCC) Protein supplementation.   Monitor albumin level. Consider nutritional services evaluation if he worsens.     GERD (gastroesophageal reflux disease) Famotidine while in the hospital.     PAF (paroxysmal atrial fibrillation) (HCC) No recent palpitations or A-fib episodes per patient. Not on anticoagulation (history of ICH).     Stress incontinence Oxybutynin trial at bedtime. Advised to do Kegel's exercises multiple times a day.      Advance Care Planning:   Code Status: Full Code                      TRH will continue to follow the patient.  Subjective: She complains of pleuritic chest pain this morning.  EKG was obtained which showed no acute changes concerning for ischemia.  She continues to cough. She reports tenderness in the right side of her chest which hurts the most.  Physical Exam: Vitals:   07/30/22 0624 07/30/22 1540 07/31/22 0556 07/31/22 0743  BP: (!) 121/104 (!) 111/56 (!) 125/51 (!) 105/45  Pulse: 91 88 80 78  Resp: (!) '24 17 20 20  '$ Temp: 98.4 F (36.9 C) 98.2 F (36.8 C) 98.9 F (37.2 C) (!) 100.6 F (38.1 C)  TempSrc: Oral Oral  Oral  SpO2: 96% 100% 94% 95%  Weight:      Height:      General: She is awake. She is not in acute distress.    Appearance: She is normal weight.     Comments: Chronically ill-appearing.  HENT:     Head: Normocephalic.     Nose: No rhinorrhea.     Mouth/Throat:  Mouth: Mucous membranes are moist.  Eyes:     General: No scleral icterus.    Pupils: Pupils are equal, round, and reactive to light.  Neck:     Vascular: No JVD.  Cardiovascular:     Rate and Rhythm: Normal rate and regular rhythm.     Heart sounds: S1 normal and S2 normal.  Pulmonary:     Effort: Pulmonary effort is normal. No tachypnea or accessory muscle usage.     Breath sounds: Wheezing present. No decreased breath sounds, rhonchi or rales.  Abdominal:     General: Bowel sounds  are normal. There is no distension.     Palpations: Abdomen is soft.     Tenderness: There is no abdominal tenderness. There is no guarding.  Musculoskeletal:     Cervical back: Neck supple.     Right lower leg: No edema.     Left lower leg: No edema.  Skin:    General: Skin is warm and dry.  Neurological:     General: No focal deficit present.     Mental Status: She is alert and oriented to person, place, and time.     Motor: Tremor present.     Comments: Tremor at baseline.  Psychiatric:        Mood and Affect: Mood normal.        Behavior: Behavior normal. Behavior is cooperative  Data Reviewed:  There are no new results to review at this time.  Family Communication:   Time spent: 15 minutes.  Author: Cristela Felt, MD 07/31/2022 10:46 AM  For on call review [email or web address].

## 2022-08-01 LAB — LACTIC ACID, PLASMA: Lactic Acid, Venous: 0.9 mmol/L (ref 0.5–1.9)

## 2022-08-01 LAB — BASIC METABOLIC PANEL
Anion gap: 5 (ref 5–15)
BUN: 5 mg/dL — ABNORMAL LOW (ref 8–23)
CO2: 26 mmol/L (ref 22–32)
Calcium: 8.5 mg/dL — ABNORMAL LOW (ref 8.9–10.3)
Chloride: 106 mmol/L (ref 98–111)
Creatinine, Ser: 0.57 mg/dL (ref 0.44–1.00)
GFR, Estimated: >60 mL/min (ref >60)
Glucose, Bld: 98 mg/dL (ref 70–99)
Potassium: 4 mmol/L (ref 3.5–5.1)
Sodium: 137 mmol/L (ref 135–145)

## 2022-08-01 MED ORDER — HYDROMORPHONE HCL 1 MG/ML IJ SOLN: 0.5000 mg | INTRAMUSCULAR | Status: DC | PRN

## 2022-08-01 MED ORDER — BENZONATATE 100 MG PO CAPS: 100.0000 mg | ORAL_CAPSULE | Freq: Three times a day (TID) | ORAL | Status: DC | PRN

## 2022-08-01 MED ORDER — SODIUM CHLORIDE 0.9 % IV SOLN: INTRAVENOUS | Status: AC

## 2022-08-01 NOTE — Progress Notes (Signed)
Consultation Progress Note   Patient: [name] [id number]:[id number] DOB: 1956-08-02 DOA: 07/28/2022 DOS: the patient was seen and examined on 08/01/2022 Primary service: DibiaManfred Shirts, MD  Brief hospital course: 66 y.o. female with medical history significant of asthma, COPD, cerebral hemorrhage, chronic back pain, DDD, DVT, GERD, headaches, history of PUD, history of GI bleed, history of blood transfusion, nephrolithiasis, peripheral neuropathy, tunnel vision who has been brought to the emergency department due to lethargy and seizures at home.  She had a seizure around 1600 yesterday and was still very lethargic in the evening.  She urinated on herself according to the patient's daughter.  The patient stated she did not feel well yesterday.  She has been having frontal/occipital headache, nonproductive cough with pleuritic CP, mild sore throat, nasal congestion and sneezing   Assessment and Plan: Principal Problem:   Seizures ([place]) Following with [name] at the clinic. Admit to PCU/inpatient. Seizure precautions. Dilantin 100 mg in the a.m. and 130 mg in the p.m. Vimpat 200 twice daily. Cenobamate 150 at bedtime. Gabapentin 600 mg at night. Lorazepam 1 mg every 4 hours as needed. Discussed with neurology on-call. Would Consult if the patient has recurrent seizures. May need to be transferred to [facility] for EEG monitoring.   Active Problems:   Fever-Improved Likely due to viral syndrome. Negative respiratory pathogen PCR results.   Pleuritic chest pain - Likely due to persistent coughing -EKG shows no concerns for ischemia -CXR showed no acute pathology  Back pain Left sided, Ct reviewed, no acute pathology noted.  Lactate ordered   Hypokalemia Replete orally    COPD (chronic obstructive pulmonary disease) (HCC) Received methylprednisolone this morning. No wheezing at this time. Will defer further use of glucocorticoids for now. DuoNeb every 4 hours as needed.      Tremor At baseline. Continue current medication regimen.     Thrombocytopenia- chronic Monitor platelet count.     Protein calorie malnutrition, mild (HCC) Protein supplementation.   Monitor albumin level. Consider nutritional services evaluation if he worsens.     GERD (gastroesophageal reflux disease) Famotidine while in the hospital.     PAF (paroxysmal atrial fibrillation) (HCC) No recent palpitations or A-fib episodes per patient. Not on anticoagulation (history of ICH).     Stress incontinence Oxybutynin trial at bedtime. Advised to do Kegel's exercises multiple times a day.   Advance Care Planning:   Code Status: Full Code          TRH will continue to follow the patient.  Subjective: Overnight events noted.  Yesterday patient complained of left-sided lower back pain.  CT abdomen and pelvis obtained   This Morning pain has improved, she currently has heating pads in place.  Physical Exam: Vitals:   07/31/22 1157 07/31/22 1159 07/31/22 2023 08/01/22 0619  BP: (!) 100/47 (!) 97/54 (!) 108/52 108/62  Pulse: 60  79 74  Resp: '18  14 20  '$ Temp: 98.3 F (36.8 C)  99.1 F (37.3 C) 98.9 F (37.2 C)  TempSrc: Oral     SpO2: 93%  91% 93%  Weight:      Height:      General: She is awake. She is not in acute distress.    Appearance: She is normal weight.     Comments: Chronically ill-appearing.  HENT:     Head: Normocephalic.     Nose: No rhinorrhea.     Mouth/Throat:     Mouth: Mucous membranes are moist.  Eyes:  General: No scleral icterus.    Pupils: Pupils are equal, round, and reactive to light.  Neck:     Vascular: No JVD.  Cardiovascular:     Rate and Rhythm: Normal rate and regular rhythm.     Heart sounds: S1 normal and S2 normal.  Pulmonary:     Effort: Pulmonary effort is normal. No tachypnea or accessory muscle usage.     Breath sounds: No decreased breath sounds, rhonchi or rales.  Abdominal:     General: Bowel sounds are normal. There  is no distension.     Palpations: Abdomen is soft.     Tenderness: There is no abdominal tenderness. There is no guarding.  Musculoskeletal:     Cervical back: Neck supple.     Right lower leg: No edema.     Left lower leg: No edema.  Skin:    General: Skin is warm and dry.  Neurological:     General: No focal deficit present.     Mental Status: She is alert and oriented to person, place, and time.     Motor: Tremor present.     Comments: Tremor at baseline.  Psychiatric:        Mood and Affect: Mood normal.        Behavior: Behavior normal. Behavior is cooperative   Data Reviewed:  There are no new results to review at this time.  Family Communication:   Time spent: 15 minutes.  Author: Cristela Felt, MD 08/01/2022 10:57 AM  For on call review [email or web address].

## 2022-08-01 NOTE — Progress Notes (Signed)
Mobility Specialist - Progress Note   08/01/22 1334  Mobility  Activity Transferred from bed to chair  Level of Assistance Standby assist, set-up cues, supervision of patient - no hands on  Assistive Device None  Distance Ambulated (ft) 3 ft  Range of Motion/Exercises Active  Activity Response Tolerated well  Mobility Referral Yes  $Mobility charge 1 Mobility   Pt was found in bed and agreeable to sit up on recliner chair. Pt was left on recliner chair with necessities in reach and chair alarm on. NT notified.  Ferd Hibbs Mobility Specialist

## 2022-08-01 NOTE — Care Management Important Message (Signed)
Important Message  Patient Details IM Letter given. Name: [name] MRN: [id number] Date of Birth: Jan 27, 1956   Medicare Important Message Given:  Yes     Kerin Salen 08/01/2022, 2:01 PM

## 2022-08-02 DIAGNOSIS — R569 Unspecified convulsions: Secondary | ICD-10-CM | POA: Diagnosis not present

## 2022-08-02 LAB — URINALYSIS, ROUTINE W REFLEX MICROSCOPIC
Bilirubin Urine: NEGATIVE
Glucose, UA: NEGATIVE mg/dL
Hgb urine dipstick: NEGATIVE
Ketones, ur: NEGATIVE mg/dL
Leukocytes,Ua: NEGATIVE
Nitrite: NEGATIVE
Protein, ur: NEGATIVE mg/dL
Specific Gravity, Urine: 1.006 (ref 1.005–1.030)
pH: 6 (ref 5.0–8.0)

## 2022-08-02 LAB — CBC
HCT: 37.6 % (ref 36.0–46.0)
Hemoglobin: 12.1 g/dL (ref 12.0–15.0)
MCH: 34.3 pg — ABNORMAL HIGH (ref 26.0–34.0)
MCHC: 32.2 g/dL (ref 30.0–36.0)
MCV: 106.5 fL — ABNORMAL HIGH (ref 80.0–100.0)
Platelets: 194 10*3/uL (ref 150–400)
RBC: 3.53 MIL/uL — ABNORMAL LOW (ref 3.87–5.11)
RDW: 12.2 % (ref 11.5–15.5)
WBC: 3.9 10*3/uL — ABNORMAL LOW (ref 4.0–10.5)
nRBC: 0 % (ref 0.0–0.2)

## 2022-08-02 LAB — BASIC METABOLIC PANEL
Anion gap: 6 (ref 5–15)
BUN: 9 mg/dL (ref 8–23)
CO2: 27 mmol/L (ref 22–32)
Calcium: 8.6 mg/dL — ABNORMAL LOW (ref 8.9–10.3)
Chloride: 108 mmol/L (ref 98–111)
Creatinine, Ser: 0.65 mg/dL (ref 0.44–1.00)
GFR, Estimated: >60 mL/min (ref >60)
Glucose, Bld: 88 mg/dL (ref 70–99)
Potassium: 4.8 mmol/L (ref 3.5–5.1)
Sodium: 141 mmol/L (ref 135–145)

## 2022-08-02 MED ORDER — KETOROLAC TROMETHAMINE 15 MG/ML IJ SOLN
7.5000 mg | Freq: Four times a day (QID) | INTRAMUSCULAR | Status: DC
  Administered 2022-08-02: 7.5 mg via INTRAVENOUS
  Filled 2022-08-02 (×2): qty 1

## 2022-08-02 MED ORDER — METHOCARBAMOL 500 MG PO TABS
500.0000 mg | ORAL_TABLET | Freq: Three times a day (TID) | ORAL | Status: AC
  Administered 2022-08-02 – 2022-08-03 (×2): 500 mg via ORAL
  Filled 2022-08-02 (×2): qty 1

## 2022-08-02 NOTE — Progress Notes (Signed)
Mobility Specialist - Progress Note   08/02/22 0932  Mobility  Activity Transferred from bed to chair  Level of Assistance Standby assist, set-up cues, supervision of patient - no hands on  Assistive Device None  Activity Response Tolerated well  Mobility Referral Yes  $Mobility charge 1 Mobility   Pt was found in bed and agreeable to transfer to recliner chair. Pt was left on recliner chair with necessities in reach and RN notified.  Ferd Hibbs Mobility Specialist

## 2022-08-02 NOTE — Progress Notes (Signed)
PROGRESS NOTE    JULIUS BONIFACE  GQQ:[id number] DOB: 05-05-1956 DOA: 07/28/2022 PCP: [name], MD  66/F w/ asthma, COPD, cerebral hemorrhage, seizure disorder, chronic back pain, DDD, DVT, GERD, headaches, history of PUD, history of GI bleed, history of blood transfusion, nephrolithiasis, peripheral neuropathy, tunnel vision who has been brought to the emergency department due to lethargy and seizures at home. History of cough congestion, fevers and  seizure on the day of admission  -History of productive cough, sore throat, congestion for 2 to 3 days prior  -In the ED Temp of 103.1, tachycardic, treated with IV ceftriaxone, doxycycline, Dilantin and fluid bolus  -UA was unremarkable, UDS positive for benzos and barbiturates -CT abdomen pelvis was unremarkable, chest x-ray was unrevealing -followed by my partner for 3days, Rx w/ supportive care, steroids, nebs etc. for COPD exacerbation -On 12/28 she had a repeat CT abdomen for abdominal pain-this noted miniscule loculations of gas density along the periphery of the left hepatic lobe, uncertain localization as to whether this is biliary, portal venous, or other.  Radiologist did not see any bowel pneumatosis or other findings of portal venous gas to indicate ischemia.,  Lactate normal  -12:30 c/o  right flank pain, ongoing for 4 to 5 days  Subjective: -Complains of right flank pain  Assessment and Plan:  Fever, URI COPD exacerbation -Treated with steroids, nebs, supportive care -Symptoms have improved, steroids stopped on 12/27 by Dr.Dibia -No further wheezing at this time  Right flank pain -Right kidney unremarkable on recent imaging -Check urinalysis -Could be musculoskeletal from coughing, add Toradol  Abnormal imaging -CT obtained by my partner 12/28 noted minuscule loculation of gas density along the periphery of left hepatic lobe could not determine if this was biliary portal venous or other, no bowel findings noted, lactate  was normal -Remains afebrile and nontoxic without fever or leukocytosis -Monitor clinically, would benefit from repeat imaging in 2 months, sooner for any clinical change  Breakthrough seizure Seizure disorder -Likely breakthrough seizure prior to admission in the setting of recent fever, URI -No further episodes -Continued on her home regimen of Dilantin, Vimpat, cenobamate, gabapentin -Follow-up with [name]  Hypokalemia -Replaced  Paroxysmal atrial fibrillation -Remains in sinus rhythm, not on anticoagulation with history of ICH  History of stress incontinence -Continue oxybutynin  DVT prophylaxis:lovenox Code Status: Full Code Family Communication: none present Disposition Plan: home in 1-2days  Consultants:    Procedures:   Antimicrobials:    Objective: Vitals:   08/02/22 0900 08/02/22 1000 08/02/22 1026 08/02/22 1100  BP:      Pulse:      Resp: '15 13 18 20  '$ Temp:      TempSrc:      SpO2:      Weight:      Height:        Intake/Output Summary (Last 24 hours) at 08/02/2022 1350 Last data filed at 08/02/2022 0600 Gross per 24 hour  Intake 2299.79 ml  Output 1850 ml  Net 449.79 ml   Filed Weights   07/28/22 2201  Weight: 68 kg    Examination:  General exam: Appears calm and comfortable  Respiratory system: Clear to auscultation Cardiovascular system: S1 & S2 heard, RRR.  Abd: nondistended, soft and nontender.Normal bowel sounds heard. Central nervous system: Alert and oriented. No focal neurological deficits. Extremities: no edema Skin: No rashes Psychiatry:  Mood & affect appropriate.     Data Reviewed:   CBC: Recent Labs  Lab 07/28/22 2225 07/30/22 1030  07/31/22 0930 08/02/22 0514  WBC 7.5 6.1 5.1 3.9*  NEUTROABS 5.7  --   --   --   HGB 11.8* 12.2 10.8* 12.1  HCT 35.8* 37.3 33.3* 37.6  MCV 105.0* 104.2* 105.0* 106.5*  PLT 142* 168 153 035   Basic Metabolic Panel: Recent Labs  Lab 07/28/22 2225 07/30/22 1030  07/31/22 0930 08/01/22 0733 08/02/22 0514  NA 139 140 138 137 141  K 3.6 3.8 3.4* 4.0 4.8  CL 102 106 105 106 108  CO2 '28 26 27 26 27  '$ GLUCOSE 121* 115* 112* 98 88  BUN 10 6* 5* 5* 9  CREATININE 0.70 0.74 0.54 0.57 0.65  CALCIUM 8.7* 8.7* 8.4* 8.5* 8.6*  MG 1.9  --   --   --   --   PHOS 2.1*  --   --   --   --    GFR: Estimated Creatinine Clearance: 69.1 mL/min (by C-G formula based on SCr of 0.65 mg/dL). Liver Function Tests: Recent Labs  Lab 07/28/22 2225 07/30/22 1030  AST 18 30  ALT 14 15  ALKPHOS 69 56  BILITOT 0.6 0.7  PROT 6.8 6.5  ALBUMIN 3.3* 3.1*   Recent Labs  Lab 07/28/22 2225  LIPASE 32   No results for input(s): "AMMONIA" in the last 168 hours. Coagulation Profile: No results for input(s): "INR", "PROTIME" in the last 168 hours. Cardiac Enzymes: Recent Labs  Lab 07/28/22 2225  CKTOTAL 182   BNP (last 3 results) No results for input(s): "PROBNP" in the last 8760 hours. HbA1C: No results for input(s): "HGBA1C" in the last 72 hours. CBG: No results for input(s): "GLUCAP" in the last 168 hours. Lipid Profile: No results for input(s): "CHOL", "HDL", "LDLCALC", "TRIG", "CHOLHDL", "LDLDIRECT" in the last 72 hours. Thyroid Function Tests: No results for input(s): "TSH", "T4TOTAL", "FREET4", "T3FREE", "THYROIDAB" in the last 72 hours. Anemia Panel: No results for input(s): "VITAMINB12", "FOLATE", "FERRITIN", "TIBC", "IRON", "RETICCTPCT" in the last 72 hours. Urine analysis:    Component Value Date/Time   COLORURINE YELLOW 07/29/2022 [place] 07/29/2022 0219   LABSPEC 1.030 07/29/2022 0219   PHURINE 6.0 07/29/2022 0219   GLUCOSEU NEGATIVE 07/29/2022 0219   HGBUR SMALL (A) 07/29/2022 0219   BILIRUBINUR SMALL (A) 07/29/2022 0219   KETONESUR NEGATIVE 07/29/2022 0219   PROTEINUR 30 (A) 07/29/2022 0219   UROBILINOGEN 0.2 01/17/2015 2350   NITRITE NEGATIVE 07/29/2022 0219   LEUKOCYTESUR NEGATIVE 07/29/2022 0219   Sepsis  Labs: '@LABRCNTIP'$ (procalcitonin:4,lacticidven:4)  ) Recent Results (from the past 240 hour(s))  Resp panel by RT-PCR (RSV, Flu A&B, Covid) Anterior Nasal Swab     Status: None   Collection Time: 07/28/22 10:25 PM   Specimen: Anterior Nasal Swab  Result Value Ref Range Status   SARS Coronavirus 2 by RT PCR NEGATIVE NEGATIVE Final    Comment: (NOTE) SARS-CoV-2 target nucleic acids are NOT DETECTED.  The SARS-CoV-2 RNA is generally detectable in upper respiratory specimens during the acute phase of infection. The lowest concentration of SARS-CoV-2 viral copies this assay can detect is 138 copies/mL. A negative result does not preclude SARS-Cov-2 infection and should not be used as the sole basis for treatment or other patient management decisions. A negative result may occur with  improper specimen collection/handling, submission of specimen other than nasopharyngeal swab, presence of viral mutation(s) within the areas targeted by this assay, and inadequate number of viral copies(<138 copies/mL). A negative result must be combined with clinical observations, patient history, and  epidemiological information. The expected result is Negative.  Fact Sheet for Patients:  EntrepreneurPulse.com.au  Fact Sheet for Healthcare Providers:  IncredibleEmployment.be  This test is no t yet approved or cleared by the [place] FDA and  has been authorized for detection and/or diagnosis of SARS-CoV-2 by FDA under an Emergency Use Authorization (EUA). This EUA will remain  in effect (meaning this test can be used) for the duration of the COVID-19 declaration under Section 564(b)(1) of the Act, 21 U.S.C.section 360bbb-3(b)(1), unless the authorization is terminated  or revoked sooner.       Influenza A by PCR NEGATIVE NEGATIVE Final   Influenza B by PCR NEGATIVE NEGATIVE Final    Comment: (NOTE) The Xpert Xpress SARS-CoV-2/FLU/RSV plus assay is intended as  an aid in the diagnosis of influenza from Nasopharyngeal swab specimens and should not be used as a sole basis for treatment. Nasal washings and aspirates are unacceptable for Xpert Xpress SARS-CoV-2/FLU/RSV testing.  Fact Sheet for Patients: EntrepreneurPulse.com.au  Fact Sheet for Healthcare Providers: IncredibleEmployment.be  This test is not yet approved or cleared by the [place] FDA and has been authorized for detection and/or diagnosis of SARS-CoV-2 by FDA under an Emergency Use Authorization (EUA). This EUA will remain in effect (meaning this test can be used) for the duration of the COVID-19 declaration under Section 564(b)(1) of the Act, 21 U.S.C. section 360bbb-3(b)(1), unless the authorization is terminated or revoked.     Resp Syncytial Virus by PCR NEGATIVE NEGATIVE Final    Comment: (NOTE) Fact Sheet for Patients: EntrepreneurPulse.com.au  Fact Sheet for Healthcare Providers: IncredibleEmployment.be  This test is not yet approved or cleared by the [place] FDA and has been authorized for detection and/or diagnosis of SARS-CoV-2 by FDA under an Emergency Use Authorization (EUA). This EUA will remain in effect (meaning this test can be used) for the duration of the COVID-19 declaration under Section 564(b)(1) of the Act, 21 U.S.C. section 360bbb-3(b)(1), unless the authorization is terminated or revoked.  Performed at [facility], [place] [address] [place], [place] [address]   Respiratory (~20 pathogens) panel by PCR     Status: None   Collection Time: 07/29/22  5:04 AM   Specimen: Nasopharyngeal Swab; Respiratory  Result Value Ref Range Status   Adenovirus NOT DETECTED NOT DETECTED Final   Coronavirus 229E NOT DETECTED NOT DETECTED Final    Comment: (NOTE) The Coronavirus on the Respiratory Panel, DOES NOT test for the novel  Coronavirus (2019 nCoV)     Coronavirus HKU1 NOT DETECTED NOT DETECTED Final   Coronavirus NL63 NOT DETECTED NOT DETECTED Final   Coronavirus OC43 NOT DETECTED NOT DETECTED Final   Metapneumovirus NOT DETECTED NOT DETECTED Final   Rhinovirus / Enterovirus NOT DETECTED NOT DETECTED Final   Influenza A NOT DETECTED NOT DETECTED Final   Influenza B NOT DETECTED NOT DETECTED Final   Parainfluenza Virus 1 NOT DETECTED NOT DETECTED Final   Parainfluenza Virus 2 NOT DETECTED NOT DETECTED Final   Parainfluenza Virus 3 NOT DETECTED NOT DETECTED Final   Parainfluenza Virus 4 NOT DETECTED NOT DETECTED Final   Respiratory Syncytial Virus NOT DETECTED NOT DETECTED Final   Bordetella pertussis NOT DETECTED NOT DETECTED Final   Bordetella Parapertussis NOT DETECTED NOT DETECTED Final   Chlamydophila pneumoniae NOT DETECTED NOT DETECTED Final   Mycoplasma pneumoniae NOT DETECTED NOT DETECTED Final    Comment: Performed at [facility], [place]. [address], [place], [place] [address]  Radiology Studies: CT ABDOMEN PELVIS WO CONTRAST  Result Date: 07/31/2022 CLINICAL DATA:  Abdominal pain EXAM: CT ABDOMEN AND PELVIS WITHOUT CONTRAST TECHNIQUE: Multidetector CT imaging of the abdomen and pelvis was performed following the standard protocol without IV contrast. RADIATION DOSE REDUCTION: This exam was performed according to the departmental dose-optimization program which includes automated exposure control, adjustment of the mA and/or kV according to patient size and/or use of iterative reconstruction technique. COMPARISON:  07/29/2022 FINDINGS: Lower chest: Airway thickening in both lower lobes associated airway plugging. Stable 4 mm nodule in the posterior basal segment left lower lobe, image 20 series 6. Trace left pleural effusion, increased from previous. Hepatobiliary: Miniscule new loculations of gas density along the periphery of the left hepatic lobe for example image 13 series 2, uncertain localization as to whether this is  biliary, portal venous, or other. I do not see any bowel pneumatosis or other findings of portal venous gas, but correlation with lactate level would be suggested. Gallbladder unremarkable. Common bile duct mildly prominent 0.9 cm in diameter on image 26 series 2, formerly about 0.8 cm on 07/29/2022. Pancreas: Unremarkable Spleen: Unremarkable Adrenals/Urinary Tract: Unremarkable Stomach/Bowel: Suspected periampullary duodenal diverticulum. As noted above, there no findings of pneumatosis. Redundant sigmoid colon extends cephalad but does not appear twisted or dilated. No dilated small bowel. Vascular/Lymphatic: Atherosclerosis is present, including aortoiliac atherosclerotic disease. Infrarenal IVC filter. Collateral venous structures along the pubis. Reproductive: Unremarkable Other: Small collections of subcutaneous gas along the anterior abdominal wall, probably related to injections. Musculoskeletal: Mild lumbar spondylosis and degenerative disc disease. Small umbilical hernia contains adipose tissue. IMPRESSION: 1. Miniscule loculations of gas density along the periphery of the left hepatic lobe, uncertain localization as to whether this is biliary, portal venous, or other. I do not see any bowel pneumatosis or other findings of portal venous gas to indicate ischemia, but correlation with lactate level would be suggested. 2. Airway thickening in both lower lobes with airway plugging. 3. Trace left pleural effusion, increased from previous. 4. Stable 4 mm nodule in the posterior basal segment left lower lobe. Stability from 03/24/2018 indicates that this is a benign lesion warranting no further workup. 5. Mild lumbar spondylosis and degenerative disc disease. 6. Small umbilical hernia contains adipose tissue. 7. Aortic atherosclerosis. Aortic Atherosclerosis (ICD10-I70.0). Electronically Signed   By: Van Clines M.D.   On: 07/31/2022 18:22     Scheduled Meds:  Cenobamate  150 mg Oral QHS    enoxaparin (LOVENOX) injection  40 mg Subcutaneous Q24H   gabapentin  600 mg Oral QHS   influenza vac split quadrivalent PF  0.5 mL Intramuscular Tomorrow-1000   lacosamide  200 mg Oral BID   oxybutynin  5 mg Oral QHS   phenytoin  100 mg Oral Daily   And   phenytoin  130 mg Oral QHS   potassium chloride  40 mEq Oral BID   Continuous Infusions:   LOS: 4 days   Time spent: 39mn   PDomenic Polite MD [facility]   08/02/2022, 1:50 PM

## 2022-08-03 DIAGNOSIS — R569 Unspecified convulsions: Secondary | ICD-10-CM | POA: Diagnosis not present

## 2022-08-03 LAB — CBC
HCT: 27.1 % — ABNORMAL LOW (ref 36.0–46.0)
HCT: 34.6 % — ABNORMAL LOW (ref 36.0–46.0)
Hemoglobin: 8.6 g/dL — ABNORMAL LOW (ref 12.0–15.0)
Hemoglobin: 11.1 g/dL — ABNORMAL LOW (ref 12.0–15.0)
MCH: 34.1 pg — ABNORMAL HIGH (ref 26.0–34.0)
MCH: 34.2 pg — ABNORMAL HIGH (ref 26.0–34.0)
MCHC: 31.7 g/dL (ref 30.0–36.0)
MCHC: 32.1 g/dL (ref 30.0–36.0)
MCV: 107.5 fL — ABNORMAL HIGH (ref 80.0–100.0)
MCV: 106.5 fL — ABNORMAL HIGH (ref 80.0–100.0)
Platelets: 209 10*3/uL (ref 150–400)
Platelets: 227 10*3/uL (ref 150–400)
RBC: 2.52 MIL/uL — ABNORMAL LOW (ref 3.87–5.11)
RBC: 3.25 MIL/uL — ABNORMAL LOW (ref 3.87–5.11)
RDW: 12.2 % (ref 11.5–15.5)
RDW: 12.3 % (ref 11.5–15.5)
WBC: 4.3 10*3/uL (ref 4.0–10.5)
WBC: 3.8 10*3/uL — ABNORMAL LOW (ref 4.0–10.5)
nRBC: 0 % (ref 0.0–0.2)
nRBC: 0 % (ref 0.0–0.2)

## 2022-08-03 LAB — RETICULOCYTES
Immature Retic Fract: 20 % — ABNORMAL HIGH (ref 2.3–15.9)
RBC.: 2.93 MIL/uL — ABNORMAL LOW (ref 3.87–5.11)
Retic Count, Absolute: 35.2 10*3/uL (ref 19.0–186.0)
Retic Ct Pct: 1.2 % (ref 0.4–3.1)

## 2022-08-03 LAB — DIFFERENTIAL
Abs Immature Granulocytes: 0.02 10*3/uL (ref 0.00–0.07)
Basophils Absolute: 0 10*3/uL (ref 0.0–0.1)
Basophils Relative: 0 %
Eosinophils Absolute: 0.1 10*3/uL (ref 0.0–0.5)
Eosinophils Relative: 2 %
Immature Granulocytes: 1 %
Lymphocytes Relative: 29 %
Lymphs Abs: 1.1 10*3/uL (ref 0.7–4.0)
Monocytes Absolute: 0.2 10*3/uL (ref 0.1–1.0)
Monocytes Relative: 5 %
Neutro Abs: 2.4 10*3/uL (ref 1.7–7.7)
Neutrophils Relative %: 63 %

## 2022-08-03 LAB — TYPE AND SCREEN
ABO/RH(D): A POS
Antibody Screen: NEGATIVE

## 2022-08-03 LAB — C-REACTIVE PROTEIN: CRP: 4.4 mg/dL — ABNORMAL HIGH (ref <1.0)

## 2022-08-03 LAB — COMPREHENSIVE METABOLIC PANEL
ALT: 14 U/L (ref 0–44)
AST: 16 U/L (ref 15–41)
Albumin: 2.5 g/dL — ABNORMAL LOW (ref 3.5–5.0)
Alkaline Phosphatase: 52 U/L (ref 38–126)
Anion gap: 7 (ref 5–15)
BUN: 9 mg/dL (ref 8–23)
CO2: 24 mmol/L (ref 22–32)
Calcium: 8.3 mg/dL — ABNORMAL LOW (ref 8.9–10.3)
Chloride: 106 mmol/L (ref 98–111)
Creatinine, Ser: 0.59 mg/dL (ref 0.44–1.00)
GFR, Estimated: >60 mL/min (ref >60)
Glucose, Bld: 101 mg/dL — ABNORMAL HIGH (ref 70–99)
Potassium: 4.5 mmol/L (ref 3.5–5.1)
Sodium: 137 mmol/L (ref 135–145)
Total Bilirubin: 0.2 mg/dL — ABNORMAL LOW (ref 0.3–1.2)
Total Protein: 5.7 g/dL — ABNORMAL LOW (ref 6.5–8.1)

## 2022-08-03 LAB — IRON AND TIBC
Iron: 80 ug/dL (ref 28–170)
Saturation Ratios: 35 % — ABNORMAL HIGH (ref 10.4–31.8)
TIBC: 228 ug/dL — ABNORMAL LOW (ref 250–450)
UIBC: 148 ug/dL

## 2022-08-03 LAB — TECHNOLOGIST SMEAR REVIEW
Plt Morphology: NORMAL
WBC MORPHOLOGY: NORMAL

## 2022-08-03 LAB — FOLATE: Folate: 5.7 ng/mL — ABNORMAL LOW (ref >5.9)

## 2022-08-03 LAB — VITAMIN B12: Vitamin B-12: 189 pg/mL (ref 180–914)

## 2022-08-03 LAB — PROCALCITONIN: Procalcitonin: 0.57 ng/mL

## 2022-08-03 LAB — SEDIMENTATION RATE: Sed Rate: 81 mm/hr — ABNORMAL HIGH (ref 0–22)

## 2022-08-03 MED ORDER — POLYETHYLENE GLYCOL 3350 17 G PO PACK
17.0000 g | PACK | Freq: Every day | ORAL | Status: DC
  Administered 2022-08-03 – 2022-08-05 (×2): 17 g via ORAL
  Filled 2022-08-03 (×4): qty 1

## 2022-08-03 MED ORDER — FOLIC ACID 1 MG PO TABS
1.0000 mg | ORAL_TABLET | Freq: Every day | ORAL | Status: DC
  Administered 2022-08-04 – 2022-08-06 (×3): 1 mg via ORAL
  Filled 2022-08-03 (×3): qty 1

## 2022-08-03 MED ORDER — CYANOCOBALAMIN 1000 MCG/ML IJ SOLN
1000.0000 ug | Freq: Every day | INTRAMUSCULAR | Status: AC
  Administered 2022-08-04 – 2022-08-05 (×2): 1000 ug via SUBCUTANEOUS
  Filled 2022-08-03 (×2): qty 1

## 2022-08-03 MED ORDER — FOLIC ACID 5 MG/ML IJ SOLN
1.0000 mg | Freq: Once | INTRAMUSCULAR | Status: AC
  Administered 2022-08-03: 1 mg via INTRAVENOUS
  Filled 2022-08-03: qty 0.2

## 2022-08-03 MED ORDER — SENNOSIDES-DOCUSATE SODIUM 8.6-50 MG PO TABS
1.0000 | ORAL_TABLET | Freq: Two times a day (BID) | ORAL | Status: DC
  Administered 2022-08-03 – 2022-08-06 (×7): 1 via ORAL
  Filled 2022-08-03 (×7): qty 1

## 2022-08-03 MED ORDER — POTASSIUM CHLORIDE CRYS ER 20 MEQ PO TBCR
40.0000 meq | EXTENDED_RELEASE_TABLET | Freq: Every day | ORAL | Status: DC
  Administered 2022-08-03 – 2022-08-06 (×4): 40 meq via ORAL
  Filled 2022-08-03 (×4): qty 2

## 2022-08-03 MED ORDER — SODIUM CHLORIDE 0.9 % IV SOLN: 1.0000 mg | Freq: Once | INTRAVENOUS | Status: DC

## 2022-08-03 MED ORDER — OXYCODONE-ACETAMINOPHEN 5-325 MG PO TABS
1.0000 | ORAL_TABLET | ORAL | Status: DC | PRN
  Administered 2022-08-03 – 2022-08-05 (×5): 1 via ORAL
  Filled 2022-08-03 (×6): qty 1

## 2022-08-03 NOTE — Plan of Care (Signed)
  Problem: Clinical Measurements: Goal: Diagnostic test results will improve Outcome: Progressing   Problem: Safety: Goal: Ability to remain free from injury will improve Outcome: Progressing   Problem: Skin Integrity: Goal: Risk for impaired skin integrity will decrease Outcome: Progressing

## 2022-08-03 NOTE — Progress Notes (Signed)
PROGRESS NOTE    [name]  YTK:[id number] DOB: 1956/06/26 DOA: 07/28/2022 PCP: [name], MD  66/F w/ asthma, COPD, cerebral hemorrhage, seizure disorder, chronic back pain, DDD, DVT, GERD, headaches, history of PUD, history of GI bleed, history of blood transfusion, nephrolithiasis, peripheral neuropathy, tunnel vision who has been brought to the emergency department due to lethargy and seizures at home. History of cough congestion, fevers and  seizure on the day of admission  -History of productive cough, sore throat, congestion for 2 to 3 days prior  -In the ED Temp of 103.1, tachycardic, treated with IV ceftriaxone, doxycycline, Dilantin and fluid bolus  -UA was unremarkable, UDS positive for benzos and barbiturates -CT abdomen pelvis was unremarkable, chest x-ray was unrevealing -followed by my partner for 3days, Rx w/ supportive care, steroids, nebs etc. for COPD exacerbation -On 12/28 she had a repeat CT abdomen for abdominal pain-this noted miniscule loculations of gas density along the periphery of the left hepatic lobe, uncertain localization as to whether this is biliary, portal venous, or other.  Radiologist did not see any bowel pneumatosis or other findings of portal venous gas to indicate ischemia.,  Lactate normal  -12:30 c/o  right flank pain, ongoing for 4 to 5 days  Subjective: Continues to complain of right flank pain.  No nausea no vomiting no fever no chills.  Also headache.  Assessment and Plan:  Fever, URI COPD exacerbation -Treated with steroids, nebs, supportive care -Symptoms have improved, steroids stopped on 12/27 by Dr.Dibia -No further wheezing at this time  Right flank pain -Right kidney unremarkable on recent imaging -Normal urinalysis -Could be musculoskeletal from coughing, add Toradol  Abnormal imaging -CT obtained by my partner 12/28 noted minuscule loculation of gas density along the periphery of left hepatic lobe could not determine if  this was biliary portal venous or other, no bowel findings noted, lactate was normal -Remains afebrile and nontoxic without fever or leukocytosis -Will perform blood cultures.  ESR 81.  CRP also elevated.  Concerning for acute abnormality.  Procalcitonin 0.57.  If trends up tomorrow.  Will add antibiotics as well.  Breakthrough seizure Seizure disorder -Likely breakthrough seizure prior to admission in the setting of recent fever, URI -No further episodes -Continued on her home regimen of Dilantin, Vimpat, cenobamate, gabapentin -Follow-up with [name]  Hypokalemia -Replaced  Paroxysmal atrial fibrillation -Remains in sinus rhythm, not on anticoagulation with history of ICH  History of stress incontinence -Continue oxybutynin  Anemia. Lab error. Patient's hemoglobin was 12.1.  Dropped down to 8.6. Recheck hemoglobin is 11.1.  mostly lab error.  Relative B12 deficiency. Vitamin B12 189.  Will treat.  Folic acid deficiency. Will treat with 1 dose of IV folic acid.   DVT prophylaxis:lovenox Code Status: Full Code Family Communication: none present Disposition Plan: home in 1-2days  Objective: Vitals:   08/02/22 1436 08/02/22 2128 08/03/22 0422 08/03/22 1235  BP: (!) 99/51 (!) 107/50 (!) 96/52 125/65  Pulse: 80 66 73 81  Resp: _0 Temp: 98.5 F (36.9 C) 97.9 F (36.6 C) 97.9 F (36.6 C) 98.3 F (36.8 C)  TempSrc: Oral Oral Oral Oral  SpO2: 94% 95% 92% 94%  Weight:      Height:        Intake/Output Summary (Last 24 hours) at 08/03/2022 1829 Last data filed at 08/03/2022 1800 Gross per 24 hour  Intake 480 ml  Output 2302 ml  Net -1822 ml    [employer]  07/28/22 2201  Weight: 68 kg    Examination:  General: in Mild distress, No Rash Cardiovascular: S1 and S2 Present, No Murmur Respiratory: Good respiratory effort, Bilateral Air entry present. No Crackles, No wheezes Abdomen: Bowel Sound present, right-sided tenderness Extremities: No  edema Neuro: Alert and oriented x3, no new focal deficit  Data Reviewed:   CBC: Recent Labs  Lab 07/28/22 2225 07/30/22 1030 07/31/22 0930 08/02/22 0514 08/03/22 0412 08/03/22 1104  WBC 7.5 6.1 5.1 3.9* 4.3 3.8*  NEUTROABS 5.7  --   --   --   --  2.4  HGB 11.8* 12.2 10.8* 12.1 8.6* 11.1*  HCT 35.8* 37.3 33.3* 37.6 27.1* 34.6*  MCV 105.0* 104.2* 105.0* 106.5* 107.5* 106.5*  PLT 142* 168 153 194 209 937    Basic Metabolic Panel: Recent Labs  Lab 07/28/22 2225 07/30/22 1030 07/31/22 0930 08/01/22 0733 08/02/22 0514 08/03/22 0412  NA 139 140 138 137 141 137  K 3.6 3.8 3.4* 4.0 4.8 4.5  CL 102 106 105 106 108 106  CO2 _0 GLUCOSE 121* 115* 112* 98 88 101*  BUN 10 6* 5* 5* 9 9  CREATININE 0.70 0.74 0.54 0.57 0.65 0.59  CALCIUM 8.7* 8.7* 8.4* 8.5* 8.6* 8.3*  MG 1.9  --   --   --   --   --   PHOS 2.1*  --   --   --   --   --     GFR: Estimated Creatinine Clearance: 69.1 mL/min (by C-G formula based on SCr of 0.59 mg/dL). Liver Function Tests: Recent Labs  Lab 07/28/22 2225 07/30/22 1030 08/03/22 0412  AST _1 ALT _2 ALKPHOS 69 56 52  BILITOT 0.6 0.7 0.2*  PROT 6.8 6.5 5.7*  ALBUMIN 3.3* 3.1* 2.5*    Recent Labs  Lab 07/28/22 2225  LIPASE 32    No results for input(s): "AMMONIA" in the last 168 hours. Coagulation Profile: No results for input(s): "INR", "PROTIME" in the last 168 hours. Cardiac Enzymes: Recent Labs  Lab 07/28/22 2225  CKTOTAL 182    BNP (last 3 results) No results for input(s): "PROBNP" in the last 8760 hours. HbA1C: No results for input(s): "HGBA1C" in the last 72 hours. CBG: No results for input(s): "GLUCAP" in the last 168 hours. Lipid Profile: No results for input(s): "CHOL", "HDL", "LDLCALC", "TRIG", "CHOLHDL", "LDLDIRECT" in the last 72 hours. Thyroid Function Tests: No results for input(s): "TSH", "T4TOTAL", "FREET4", "T3FREE", "THYROIDAB" in the last 72 hours. Anemia Panel: Recent Labs     08/03/22 1104  VITAMINB12 189  FOLATE 5.7*  TIBC 228*  IRON 80  RETICCTPCT 1.2   Urine analysis:    Component Value Date/Time   COLORURINE STRAW (A) 08/02/2022 1448   APPEARANCEUR CLEAR 08/02/2022 1448   LABSPEC 1.006 08/02/2022 1448   PHURINE 6.0 08/02/2022 1448   GLUCOSEU NEGATIVE 08/02/2022 1448   HGBUR NEGATIVE 08/02/2022 1448   BILIRUBINUR NEGATIVE 08/02/2022 1448   KETONESUR NEGATIVE 08/02/2022 1448   PROTEINUR NEGATIVE 08/02/2022 1448   UROBILINOGEN 0.2 01/17/2015 2350   NITRITE NEGATIVE 08/02/2022 1448   LEUKOCYTESUR NEGATIVE 08/02/2022 1448   Sepsis Labs: _3 (procalcitonin:4,lacticidven:4)  ) Recent Results (from the past 240 hour(s))  Resp panel by RT-PCR (RSV, Flu A&B, Covid) Anterior Nasal Swab     Status: None   Collection Time: 07/28/22 10:25 PM   Specimen: Anterior Nasal Swab  Result Value Ref Range Status   SARS  Coronavirus 2 by RT PCR NEGATIVE NEGATIVE Final    Comment: (NOTE) SARS-CoV-2 target nucleic acids are NOT DETECTED.  The SARS-CoV-2 RNA is generally detectable in upper respiratory specimens during the acute phase of infection. The lowest concentration of SARS-CoV-2 viral copies this assay can detect is 138 copies/mL. A negative result does not preclude SARS-Cov-2 infection and should not be used as the sole basis for treatment or other patient management decisions. A negative result may occur with  improper specimen collection/handling, submission of specimen other than nasopharyngeal swab, presence of viral mutation(s) within the areas targeted by this assay, and inadequate number of viral copies(<138 copies/mL). A negative result must be combined with clinical observations, patient history, and epidemiological information. The expected result is Negative.  Fact Sheet for Patients:  EntrepreneurPulse.com.au  Fact Sheet for Healthcare Providers:  IncredibleEmployment.be  This test is no t yet  approved or cleared by the [place] FDA and  has been authorized for detection and/or diagnosis of SARS-CoV-2 by FDA under an Emergency Use Authorization (EUA). This EUA will remain  in effect (meaning this test can be used) for the duration of the COVID-19 declaration under Section 564(b)(1) of the Act, 21 U.S.C.section 360bbb-3(b)(1), unless the authorization is terminated  or revoked sooner.       Influenza A by PCR NEGATIVE NEGATIVE Final   Influenza B by PCR NEGATIVE NEGATIVE Final    Comment: (NOTE) The Xpert Xpress SARS-CoV-2/FLU/RSV plus assay is intended as an aid in the diagnosis of influenza from Nasopharyngeal swab specimens and should not be used as a sole basis for treatment. Nasal washings and aspirates are unacceptable for Xpert Xpress SARS-CoV-2/FLU/RSV testing.  Fact Sheet for Patients: EntrepreneurPulse.com.au  Fact Sheet for Healthcare Providers: IncredibleEmployment.be  This test is not yet approved or cleared by the [place] FDA and has been authorized for detection and/or diagnosis of SARS-CoV-2 by FDA under an Emergency Use Authorization (EUA). This EUA will remain in effect (meaning this test can be used) for the duration of the COVID-19 declaration under Section 564(b)(1) of the Act, 21 U.S.C. section 360bbb-3(b)(1), unless the authorization is terminated or revoked.     Resp Syncytial Virus by PCR NEGATIVE NEGATIVE Final    Comment: (NOTE) Fact Sheet for Patients: EntrepreneurPulse.com.au  Fact Sheet for Healthcare Providers: IncredibleEmployment.be  This test is not yet approved or cleared by the [place] FDA and has been authorized for detection and/or diagnosis of SARS-CoV-2 by FDA under an Emergency Use Authorization (EUA). This EUA will remain in effect (meaning this test can be used) for the duration of the COVID-19 declaration under Section 564(b)(1) of  the Act, 21 U.S.C. section 360bbb-3(b)(1), unless the authorization is terminated or revoked.  Performed at [facility], [employer] [address] [place], [place] [address]   Respiratory (~20 pathogens) panel by PCR     Status: None   Collection Time: 07/29/22  5:04 AM   Specimen: Nasopharyngeal Swab; Respiratory  Result Value Ref Range Status   Adenovirus NOT DETECTED NOT DETECTED Final   Coronavirus 229E NOT DETECTED NOT DETECTED Final    Comment: (NOTE) The Coronavirus on the Respiratory Panel, DOES NOT test for the novel  Coronavirus (2019 nCoV)    Coronavirus HKU1 NOT DETECTED NOT DETECTED Final   Coronavirus NL63 NOT DETECTED NOT DETECTED Final   Coronavirus OC43 NOT DETECTED NOT DETECTED Final   Metapneumovirus NOT DETECTED NOT DETECTED Final   Rhinovirus / Enterovirus NOT DETECTED NOT DETECTED Final   Influenza  A NOT DETECTED NOT DETECTED Final   Influenza B NOT DETECTED NOT DETECTED Final   Parainfluenza Virus 1 NOT DETECTED NOT DETECTED Final   Parainfluenza Virus 2 NOT DETECTED NOT DETECTED Final   Parainfluenza Virus 3 NOT DETECTED NOT DETECTED Final   Parainfluenza Virus 4 NOT DETECTED NOT DETECTED Final   Respiratory Syncytial Virus NOT DETECTED NOT DETECTED Final   Bordetella pertussis NOT DETECTED NOT DETECTED Final   Bordetella Parapertussis NOT DETECTED NOT DETECTED Final   Chlamydophila pneumoniae NOT DETECTED NOT DETECTED Final   Mycoplasma pneumoniae NOT DETECTED NOT DETECTED Final    Comment: Performed at [facility], [place] [address] [place], [place] [address]     Radiology Studies: No results found.   Scheduled Meds:  Cenobamate  150 mg Oral QHS   [START ON 08/04/2022] cyanocobalamin  1,000 mcg Subcutaneous Q1200   enoxaparin (LOVENOX) injection  40 mg Subcutaneous Q24H   [START ON 08/10/4713] folic acid  1 mg Oral Daily   gabapentin  600 mg Oral QHS   influenza vac split quadrivalent PF  0.5 mL Intramuscular Tomorrow-1000    lacosamide  200 mg Oral BID   oxybutynin  5 mg Oral QHS   phenytoin  100 mg Oral Daily   And   phenytoin  130 mg Oral QHS   polyethylene glycol  17 g Oral Daily   potassium chloride  40 mEq Oral Daily   senna-docusate  1 tablet Oral BID   Continuous Infusions:   LOS: 5 days   Time spent: 71mn PBerle Mull   08/03/2022, 6:29 PM

## 2022-08-04 ENCOUNTER — Inpatient Hospital Stay (HOSPITAL_COMMUNITY): Payer: Medicare Other

## 2022-08-04 DIAGNOSIS — R569 Unspecified convulsions: Secondary | ICD-10-CM | POA: Diagnosis not present

## 2022-08-04 LAB — PROCALCITONIN: Procalcitonin: 0.36 ng/mL

## 2022-08-04 MED ORDER — ENSURE ENLIVE PO LIQD
237.0000 mL | Freq: Two times a day (BID) | ORAL | Status: DC
  Administered 2022-08-04 – 2022-08-06 (×3): 237 mL via ORAL

## 2022-08-04 MED ORDER — AMOXICILLIN-POT CLAVULANATE 875-125 MG PO TABS
1.0000 | ORAL_TABLET | Freq: Two times a day (BID) | ORAL | Status: DC
  Administered 2022-08-04 – 2022-08-06 (×5): 1 via ORAL
  Filled 2022-08-04 (×5): qty 1

## 2022-08-04 NOTE — Care Management Important Message (Signed)
Important Message  Patient Details IM Letter given Name: DAVEN PINCKNEY MRN: [id number] Date of Birth: 30-Jun-1956   Medicare Important Message Given:  Yes     Kerin Salen 08/04/2022, 12:18 PM

## 2022-08-04 NOTE — Evaluation (Signed)
Occupational Therapy Evaluation Patient Details Name: [name] MRN: [id number] DOB: January 13, 1956 Today's Date: 08/04/2022   History of Present Illness 67 y.o. female brought to the emergency department due to lethargy and seizures at home.  medical history significant of asthma, COPD, cerebral hemorrhage, chronic back pain, DDD, DVT, GERD, headaches, history of PUD, history of GI bleed, history of blood transfusion, nephrolithiasis, peripheral neuropathy, tunnel vision   Clinical Impression   This 67 yo female admitted with above presents to acute OT with PLOF per her report of being totally independent with all basic ADLs and can fix herself some small (heat up meals/snacks). Currently she is very unsteady when up on her feet, and has limited use of LUE thus making her at a min A-Mod A level for basic ADLs and min-mod A for overall transfers and mobility. She will continue to benefit from acute OT with follow up at SNF unless family can provide 24/7 S and A anytime she is up on her feet then could do [place].      Recommendations for follow up therapy are one component of a multi-disciplinary discharge planning process, led by the attending physician.  Recommendations may be updated based on patient status, additional functional criteria and insurance authorization.   Follow Up Recommendations  Skilled nursing-short term rehab (<3 hours/day)     Assistance Recommended at Discharge Frequent or constant Supervision/Assistance  Patient can return home with the following A lot of help with walking and/or transfers;A lot of help with bathing/dressing/bathroom;Assistance with cooking/housework;Assist for transportation;Direct supervision/assist for financial management;Direct supervision/assist for medications management    Functional Status Assessment  Patient has had a recent decline in their functional status and demonstrates the ability to make significant improvements in function in a  reasonable and predictable amount of time.  Equipment Recommendations  None recommended by OT       Precautions / Restrictions Precautions Precautions: Fall Required Braces or Orthoses: Other Brace Other Brace: L wrist splint; pt states she fell and "broke her wrist and had a consussion" ~ 07/08/22 Restrictions Weight Bearing Restrictions: No Other Position/Activity Restrictions: No parameters in chart, but will be conservative and no WB'ing      Mobility Bed Mobility               General bed mobility comments: pt up in recliner upon my arrival    Transfers Overall transfer level: Needs assistance Equipment used: 1 person hand held assist Transfers: Sit to/from Stand Sit to Stand: Mod assist           General transfer comment: 3 steps forward and backward Mod A      Balance Overall balance assessment: Needs assistance Sitting-balance support: No upper extremity supported, Feet supported Sitting balance-Leahy Scale: Fair     Standing balance support: Single extremity supported Standing balance-Leahy Scale: Poor                             ADL either performed or assessed with clinical judgement   ADL Overall ADL's : Needs assistance/impaired Eating/Feeding: Modified independent;Sitting Eating/Feeding Details (indicate cue type and reason): increased time for 2 handed tasks Grooming: Minimal assistance;Sitting   Upper Body Bathing: Minimal assistance;Sitting   Lower Body Bathing: Moderate assistance Lower Body Bathing Details (indicate cue type and reason): Mod A for standing balance Upper Body Dressing : Moderate assistance;Sitting   Lower Body Dressing: Moderate assistance Lower Body Dressing Details (indicate cue type and reason):  Mod A for standing balance Toilet Transfer: Moderate assistance;Ambulation Toilet Transfer Details (indicate cue type and reason): 1 person hand held on right, very shaky/unsteady on feet Toileting- Clothing  Manipulation and Hygiene: Moderate assistance Toileting - Clothing Manipulation Details (indicate cue type and reason): Mod A for standing balance             Vision Baseline Vision/History: 1 Wears glasses (tunnel vision) Ability to See in Adequate Light: 0 Adequate Patient Visual Report: No change from baseline              Pertinent Vitals/Pain Pain Assessment Pain Assessment: Faces Faces Pain Scale: Hurts a little bit Pain Location: LUE with certain movements Pain Descriptors / Indicators: Sore, Aching Pain Intervention(s): Limited activity within patient's tolerance, Monitored during session, Repositioned, Ice applied     Hand Dominance Right   Extremity/Trunk Assessment Upper Extremity Assessment Upper Extremity Assessment: RUE deficits/detail;LUE deficits/detail RUE Deficits / Details: WFL LUE Deficits / Details: left wrist fx from first part of Dec per pt, new xrays completed with results pending, forearm based wrist brace. LUE Coordination: decreased gross motor         Communication Communication Communication: No difficulties   Cognition Arousal/Alertness: Awake/alert Behavior During Therapy: WFL for tasks assessed/performed Overall Cognitive Status: No family/caregiver present to determine baseline cognitive functioning Area of Impairment: Attention, Following commands, Safety/judgement, Awareness, Problem solving                   Current Attention Level: Sustained   Following Commands: Follows one step commands consistently, Follows one step commands with increased time Safety/Judgement: Decreased awareness of safety, Decreased awareness of deficits Awareness: Intellectual Problem Solving: Requires verbal cues, Requires tactile cues General Comments: very unsteady on her feet, but says she will be fine at home                [place] expects to be discharged to:: Private residence Living Arrangements:  Children Available Help at Discharge: Family;Available 24 hours/day Type of Home: House Home Access: Level entry     Home Layout: Two level Alternate Level Stairs-Number of Steps: has chair/stair  lift -doesn't go up/down stairs on foot   Bathroom Shower/Tub: Teacher, early years/pre: Standard     Home Equipment: Conservation officer, nature (2 wheels)          Prior Functioning/Environment Prior Level of Function : Independent/Modified Independent                        OT Problem List: Decreased strength;Decreased range of motion;Impaired balance (sitting and/or standing);Impaired vision/perception;Decreased coordination;Decreased safety awareness;Pain;Impaired UE functional use      OT Treatment/Interventions: Self-care/ADL training;DME and/or AE instruction;Patient/family education;Balance training    OT Goals(Current goals can be found in the care plan section) Acute Rehab OT Goals Patient Stated Goal: to know more about my arm OT Goal Formulation: With patient Time For Goal Achievement: 08/18/22 Potential to Achieve Goals: Good  OT Frequency: Min 2X/week       AM-PAC OT "6 Clicks" Daily Activity     Outcome Measure Help from another person eating meals?: None Help from another person taking care of personal grooming?: A Little Help from another person toileting, which includes using toliet, bedpan, or urinal?: A Lot Help from another person bathing (including washing, rinsing, drying)?: A Lot Help from another person to put on and taking off regular upper body clothing?: A Lot Help from another person to  put on and taking off regular lower body clothing?: A Lot 6 Click Score: 15   End of Session Equipment Utilized During Treatment: Gait belt  Activity Tolerance: Patient tolerated treatment well Patient left: in chair;with call bell/phone within reach;with chair alarm set (ice on arm)  OT Visit Diagnosis: Unsteadiness on feet (R26.81);Other abnormalities  of gait and mobility (R26.89);Muscle weakness (generalized) (M62.81);Pain;Other symptoms and signs involving cognitive function;Low vision, both eyes (H54.2) Pain - Right/Left: Left Pain - part of body: Arm                Time: 1415-1433 OT Time Calculation (min): 18 min Charges:  OT General Charges $OT Visit: 1 Visit OT Evaluation $OT Eval Moderate Complexity: [place], OTR/L Acute [employer] [facility] [phone] Office [phone]    Almon Register 08/04/2022, 2:52 PM

## 2022-08-04 NOTE — Progress Notes (Addendum)
PROGRESS NOTE    [name]  IRC:[id number] DOB: 08-26-1955 DOA: 07/28/2022 PCP: [name], MD  66/F w/ asthma, COPD, cerebral hemorrhage, seizure disorder, chronic back pain, DDD, DVT, GERD, headaches, history of PUD, history of GI bleed, history of blood transfusion, nephrolithiasis, peripheral neuropathy, tunnel vision who has been brought to the emergency department due to lethargy and seizures at home. History of cough congestion, fevers and  seizure on the day of admission  -History of productive cough, sore throat, congestion for 2 to 3 days prior  -In the ED Temp of 103.1, tachycardic, treated with IV ceftriaxone, doxycycline, Dilantin and fluid bolus  -UA was unremarkable, UDS positive for benzos and barbiturates -CT abdomen pelvis was unremarkable, chest x-ray was unrevealing -followed by my partner for 3days, Rx w/ supportive care, steroids, nebs etc. for COPD exacerbation -On 12/28 she had a repeat CT abdomen for abdominal pain-this noted miniscule loculations of gas density along the periphery of the left hepatic lobe, uncertain localization as to whether this is biliary, portal venous, or other.  Radiologist did not see any bowel pneumatosis or other findings of portal venous gas to indicate ischemia.,  Lactate normal  -12:30 c/o  right flank pain, ongoing for 4 to 5 days  Subjective: Right flank pain still present.  No nausea no vomiting no fever no chills.  Reports more pain on her left wrist  Assessment and Plan: Fever, URI COPD exacerbation -Treated with steroids, nebs, supportive care -Symptoms have improved, steroids stopped on 12/27 by Dr.Dibia -No further wheezing at this time  Right flank pain Abnormal imaging -CT obtained by my partner 12/28 noted minuscule loculation of gas density along the periphery of left hepatic lobe could not determine if this was biliary portal venous or other, no bowel findings noted, lactate was normal -Remains afebrile and  nontoxic without fever or leukocytosis -Will perform blood cultures.  ESR 81.  CRP also elevated.  Concerning for acute abnormality.  Procalcitonin 0.57.  I initiate oral antibiotics.  Will continue for total 7 days.  Breakthrough seizure Seizure disorder -Likely breakthrough seizure prior to admission in the setting of recent fever, URI -No further episodes -Continued on her home regimen of Dilantin, Vimpat, cenobamate, gabapentin -Follow-up with [name]  Hypokalemia -Replaced  Paroxysmal atrial fibrillation -Remains in sinus rhythm, not on anticoagulation with history of ICH  History of stress incontinence -Continue oxybutynin  Anemia. Lab error. Patient's hemoglobin was 12.1.  Dropped down to 8.6. Recheck hemoglobin is 11.1.  mostly lab error.  Relative B12 deficiency. Vitamin B12 189.  Will treat.  Folic acid deficiency. Will treat with 1 dose of IV folic acid.  Left radial fracture. After a fall. Seen by Dr. Kerney Elbe outpatient.  Plan was to treat conservatively.  Due to pain we will perform another x-ray to ensure stability.  DVT prophylaxis:lovenox Code Status: Full Code Family Communication: none present Disposition Plan: PT recommends SNF.  Objective: Vitals:   08/03/22 2044 08/03/22 2130 08/04/22 0513 08/04/22 1206  BP: (!) 91/49 (!) 109/52 (!) 96/58 (!) 99/53  Pulse: (!) 56  66 (!) 58  Resp: _0 Temp: 98.5 F (36.9 C)  97.6 F (36.4 C) 97.9 F (36.6 C)  TempSrc: Oral  Oral Oral  SpO2: 93%  93% 97%  Weight:      Height:        Intake/Output Summary (Last 24 hours) at 08/04/2022 1818 Last data filed at 08/04/2022 1743 Gross per 24 hour  Intake  240 ml  Output 1350 ml  Net -1110 ml    Filed Weights   07/28/22 2201  Weight: 68 kg    Examination: General: in Mild distress, No Rash Cardiovascular: S1 and S2 Present, No Murmur Respiratory: Good respiratory effort, Bilateral Air entry present. No Crackles, No wheezes Abdomen: Bowel Sound  present, right-sided tenderness, no guarding or rigidity Extremities: No edema, no significant evidence of infection on the left upper extremity no redness no edema Neuro: Alert and oriented x3, no new focal deficit   Data Reviewed:   CBC: Recent Labs  Lab 07/28/22 2225 07/30/22 1030 07/31/22 0930 08/02/22 0514 08/03/22 0412 08/03/22 1104  WBC 7.5 6.1 5.1 3.9* 4.3 3.8*  NEUTROABS 5.7  --   --   --   --  2.4  HGB 11.8* 12.2 10.8* 12.1 8.6* 11.1*  HCT 35.8* 37.3 33.3* 37.6 27.1* 34.6*  MCV 105.0* 104.2* 105.0* 106.5* 107.5* 106.5*  PLT 142* 168 153 194 209 267    Basic Metabolic Panel: Recent Labs  Lab 07/28/22 2225 07/30/22 1030 07/31/22 0930 08/01/22 0733 08/02/22 0514 08/03/22 0412  NA 139 140 138 137 141 137  K 3.6 3.8 3.4* 4.0 4.8 4.5  CL 102 106 105 106 108 106  CO2 _0 GLUCOSE 121* 115* 112* 98 88 101*  BUN 10 6* 5* 5* 9 9  CREATININE 0.70 0.74 0.54 0.57 0.65 0.59  CALCIUM 8.7* 8.7* 8.4* 8.5* 8.6* 8.3*  MG 1.9  --   --   --   --   --   PHOS 2.1*  --   --   --   --   --     GFR: Estimated Creatinine Clearance: 69.1 mL/min (by C-G formula based on SCr of 0.59 mg/dL). Liver Function Tests: Recent Labs  Lab 07/28/22 2225 07/30/22 1030 08/03/22 0412  AST _1 ALT _2 ALKPHOS 69 56 52  BILITOT 0.6 0.7 0.2*  PROT 6.8 6.5 5.7*  ALBUMIN 3.3* 3.1* 2.5*    Recent Labs  Lab 07/28/22 2225  LIPASE 32    No results for input(s): "AMMONIA" in the last 168 hours. Coagulation Profile: No results for input(s): "INR", "PROTIME" in the last 168 hours. Cardiac Enzymes: Recent Labs  Lab 07/28/22 2225  CKTOTAL 182    BNP (last 3 results) No results for input(s): "PROBNP" in the last 8760 hours. HbA1C: No results for input(s): "HGBA1C" in the last 72 hours. CBG: No results for input(s): "GLUCAP" in the last 168 hours. Lipid Profile: No results for input(s): "CHOL", "HDL", "LDLCALC", "TRIG", "CHOLHDL", "LDLDIRECT" in the last 72  hours. Thyroid Function Tests: No results for input(s): "TSH", "T4TOTAL", "FREET4", "T3FREE", "THYROIDAB" in the last 72 hours. Anemia Panel: Recent Labs    08/03/22 1104  VITAMINB12 189  FOLATE 5.7*  TIBC 228*  IRON 80  RETICCTPCT 1.2    Urine analysis:    Component Value Date/Time   COLORURINE STRAW (A) 08/02/2022 1448   APPEARANCEUR CLEAR 08/02/2022 1448   LABSPEC 1.006 08/02/2022 1448   PHURINE 6.0 08/02/2022 1448   GLUCOSEU NEGATIVE 08/02/2022 1448   HGBUR NEGATIVE 08/02/2022 1448   BILIRUBINUR NEGATIVE 08/02/2022 1448   KETONESUR NEGATIVE 08/02/2022 1448   PROTEINUR NEGATIVE 08/02/2022 1448   UROBILINOGEN 0.2 01/17/2015 2350   NITRITE NEGATIVE 08/02/2022 1448   LEUKOCYTESUR NEGATIVE 08/02/2022 1448   Sepsis Labs: _3 (procalcitonin:4,lacticidven:4)  ) Recent Results (from the past 240 hour(s))  Resp panel by  RT-PCR (RSV, Flu A&B, Covid) Anterior Nasal Swab     Status: None   Collection Time: 07/28/22 10:25 PM   Specimen: Anterior Nasal Swab  Result Value Ref Range Status   SARS Coronavirus 2 by RT PCR NEGATIVE NEGATIVE Final    Comment: (NOTE) SARS-CoV-2 target nucleic acids are NOT DETECTED.  The SARS-CoV-2 RNA is generally detectable in upper respiratory specimens during the acute phase of infection. The lowest concentration of SARS-CoV-2 viral copies this assay can detect is 138 copies/mL. A negative result does not preclude SARS-Cov-2 infection and should not be used as the sole basis for treatment or other patient management decisions. A negative result may occur with  improper specimen collection/handling, submission of specimen other than nasopharyngeal swab, presence of viral mutation(s) within the areas targeted by this assay, and inadequate number of viral copies(<138 copies/mL). A negative result must be combined with clinical observations, patient history, and epidemiological information. The expected result is Negative.  Fact Sheet for  Patients:  EntrepreneurPulse.com.au  Fact Sheet for Healthcare Providers:  IncredibleEmployment.be  This test is no t yet approved or cleared by the [place] FDA and  has been authorized for detection and/or diagnosis of SARS-CoV-2 by FDA under an Emergency Use Authorization (EUA). This EUA will remain  in effect (meaning this test can be used) for the duration of the COVID-19 declaration under Section 564(b)(1) of the Act, 21 U.S.C.section 360bbb-3(b)(1), unless the authorization is terminated  or revoked sooner.       Influenza A by PCR NEGATIVE NEGATIVE Final   Influenza B by PCR NEGATIVE NEGATIVE Final    Comment: (NOTE) The Xpert Xpress SARS-CoV-2/FLU/RSV plus assay is intended as an aid in the diagnosis of influenza from Nasopharyngeal swab specimens and should not be used as a sole basis for treatment. Nasal washings and aspirates are unacceptable for Xpert Xpress SARS-CoV-2/FLU/RSV testing.  Fact Sheet for Patients: EntrepreneurPulse.com.au  Fact Sheet for Healthcare Providers: IncredibleEmployment.be  This test is not yet approved or cleared by the [place] FDA and has been authorized for detection and/or diagnosis of SARS-CoV-2 by FDA under an Emergency Use Authorization (EUA). This EUA will remain in effect (meaning this test can be used) for the duration of the COVID-19 declaration under Section 564(b)(1) of the Act, 21 U.S.C. section 360bbb-3(b)(1), unless the authorization is terminated or revoked.     Resp Syncytial Virus by PCR NEGATIVE NEGATIVE Final    Comment: (NOTE) Fact Sheet for Patients: EntrepreneurPulse.com.au  Fact Sheet for Healthcare Providers: IncredibleEmployment.be  This test is not yet approved or cleared by the [place] FDA and has been authorized for detection and/or diagnosis of SARS-CoV-2 by FDA under an Emergency  Use Authorization (EUA). This EUA will remain in effect (meaning this test can be used) for the duration of the COVID-19 declaration under Section 564(b)(1) of the Act, 21 U.S.C. section 360bbb-3(b)(1), unless the authorization is terminated or revoked.  Performed at [facility], [place] [address], [place], [place] [address]   Respiratory (~20 pathogens) panel by PCR     Status: None   Collection Time: 07/29/22  5:04 AM   Specimen: Nasopharyngeal Swab; Respiratory  Result Value Ref Range Status   Adenovirus NOT DETECTED NOT DETECTED Final   Coronavirus 229E NOT DETECTED NOT DETECTED Final    Comment: (NOTE) The Coronavirus on the Respiratory Panel, DOES NOT test for the novel  Coronavirus (2019 nCoV)    Coronavirus HKU1 NOT DETECTED NOT DETECTED Final   Coronavirus  NL63 NOT DETECTED NOT DETECTED Final   Coronavirus OC43 NOT DETECTED NOT DETECTED Final   Metapneumovirus NOT DETECTED NOT DETECTED Final   Rhinovirus / Enterovirus NOT DETECTED NOT DETECTED Final   Influenza A NOT DETECTED NOT DETECTED Final   Influenza B NOT DETECTED NOT DETECTED Final   Parainfluenza Virus 1 NOT DETECTED NOT DETECTED Final   Parainfluenza Virus 2 NOT DETECTED NOT DETECTED Final   Parainfluenza Virus 3 NOT DETECTED NOT DETECTED Final   Parainfluenza Virus 4 NOT DETECTED NOT DETECTED Final   Respiratory Syncytial Virus NOT DETECTED NOT DETECTED Final   Bordetella pertussis NOT DETECTED NOT DETECTED Final   Bordetella Parapertussis NOT DETECTED NOT DETECTED Final   Chlamydophila pneumoniae NOT DETECTED NOT DETECTED Final   Mycoplasma pneumoniae NOT DETECTED NOT DETECTED Final    Comment: Performed at [facility], [place] [address] [place], [place] [address]  Culture, blood (Routine X 2) w Reflex to ID Panel     Status: None (Preliminary result)   Collection Time: 08/03/22 11:04 AM   Specimen: BLOOD  Result Value Ref Range Status   Specimen Description   Final    BLOOD BLOOD  LEFT ARM Performed at [facility] [address], [place], [place] [address]    Special Requests   Final    BOTTLES DRAWN AEROBIC AND ANAEROBIC Blood Culture adequate volume Performed at [facility] [address], [place], [place] [address]    Culture   Final    NO GROWTH < 24 HOURS Performed at [facility] [address], [place], [place] [address]    Report Status PENDING  Incomplete  Culture, blood (Routine X 2) w Reflex to ID Panel     Status: None (Preliminary result)   Collection Time: 08/03/22 11:04 AM   Specimen: BLOOD  Result Value Ref Range Status   Specimen Description   Final    BLOOD BLOOD RIGHT ARM Performed at [facility] [address], [place], [place] [address]    Special Requests   Final    BOTTLES DRAWN AEROBIC AND ANAEROBIC Blood Culture adequate volume Performed at [facility] [address] [place], [place] [address]    Culture   Final    NO GROWTH < 24 HOURS Performed at [facility] [address], [place],  [address]    Report Status PENDING  Incomplete     Radiology Studies: No results found.   Scheduled Meds:  amoxicillin-clavulanate  1 tablet Oral Q12H   Cenobamate  150 mg Oral QHS   cyanocobalamin  1,000 mcg Subcutaneous Q1200   enoxaparin (LOVENOX) injection  40 mg Subcutaneous Q24H   feeding supplement  237 mL Oral BID BM   folic acid  1 mg Oral Daily   gabapentin  600 mg Oral QHS   influenza vac split quadrivalent PF  0.5 mL Intramuscular Tomorrow-1000   lacosamide  200 mg Oral BID   oxybutynin  5 mg Oral QHS   phenytoin  100 mg Oral Daily   And   phenytoin  130 mg Oral QHS   polyethylene glycol  17 g Oral Daily   potassium chloride  40 mEq Oral Daily   senna-docusate  1 tablet Oral BID   Continuous Infusions:   LOS: 6 days   Time spent: 62mn PBerle Mull   08/04/2022, 6:18 PM

## 2022-08-04 NOTE — Evaluation (Signed)
Physical Therapy Evaluation Patient Details Name: [name] MRN: [id number] DOB: 04/11/1956 Today's Date: 08/04/2022  History of Present Illness  67 y.o. female brought to the emergency department due to lethargy and seizures at home.  L wrist fx'd per pt, xrays in  house pending at this time. medical history significant of asthma, COPD, cerebral hemorrhage, chronic back pain, DDD, DVT, GERD, headaches, history of PUD, history of GI bleed, history of blood transfusion, nephrolithiasis, peripheral neuropathy, tunnel vision  Clinical Impression  Pt admitted with above diagnosis.  Pt requiring min to mod assist for basic transfers, she is a high fall risk; pt requires excessive time to complete basic tasks, requiring frequent redirection, demonstrates poor insight into her deficits; recommend SNF however pt is refusing at this time, HHPT if pt continues to decline SNF/dtr can provide incr assist.  Dtr works from home per pt.   Pt currently with functional limitations due to the deficits listed below (see PT Problem List). Pt will benefit from skilled PT to increase their independence and safety with mobility to allow discharge to the venue listed below.          Recommendations for follow up therapy are one component of a multi-disciplinary discharge planning process, led by the attending physician.  Recommendations may be updated based on patient status, additional functional criteria and insurance authorization.  Follow Up Recommendations Skilled nursing-short term rehab (<3 hours/day) (vs HHPT, pt refusing SNF) Can patient physically be transported by private vehicle: Yes    Assistance Recommended at Discharge Frequent or constant Supervision/Assistance  Patient can return home with the following  A little help with walking and/or transfers;A little help with bathing/dressing/bathroom;Assistance with cooking/housework;Assist for transportation;Help with stairs or ramp for entrance;Direct  supervision/assist for medications management    Equipment Recommendations Other (comment) (TBD)  Recommendations for Other Services       Functional Status Assessment Patient has had a recent decline in their functional status and demonstrates the ability to make significant improvements in function in a reasonable and predictable amount of time.     Precautions / Restrictions Precautions Required Braces or Orthoses: Other Brace Other Brace: L wrist splint; pt states she fell and "broke her wrist and had a consussion" ~ 07/08/22 Restrictions Other Position/Activity Restrictions: unsure ir pt allowed to place wt onL hand/wrist--? would assume NWB      Mobility  Bed Mobility Overal bed mobility: Needs Assistance Bed Mobility: Supine to Sit     Supine to sit: Min guard, HOB elevated     General bed mobility comments: excessive time to come to sit,  multi-modal cues to self assist and stay on task    Transfers Overall transfer level: Needs assistance Equipment used: None, Rolling walker (2 wheels) Transfers: Sit to/from Stand, Bed to chair/wheelchair/BSC Sit to Stand: Min assist, Mod assist   Step pivot transfers: Min assist, Mod assist       General transfer comment: STS x3; extremely unsteady on standing, wide BOS noted. R hand on RW only for stand pivot, pt with episode of bil knee buckling requiring assist to remain standing, able to recover and take steps back to chair    Ambulation/Gait               General Gait Details: unable d/t fatigue and pain  Stairs            Wheelchair Mobility    Modified Rankin (Stroke Patients Only)       Balance Overall balance  assessment: Needs assistance Sitting-balance support: No upper extremity supported, Feet supported Sitting balance-Leahy Scale: Fair     Standing balance support: Single extremity supported, During functional activity Standing balance-Leahy Scale: Poor                                Pertinent Vitals/Pain Pain Assessment Pain Assessment: Faces Faces Pain Scale: Hurts a little bit Pain Location: "right side" Pain Descriptors / Indicators: Sore Pain Intervention(s): Limited activity within patient's tolerance, Monitored during session, Repositioned    Home Living Family/patient expects to be discharged to:: Private residence Living Arrangements: Children Available Help at Discharge: Family;Available 24 hours/day Type of Home: House Home Access: Level entry     Alternate Level Stairs-Number of Steps: has chair/stair  lift -doesn't go up/down stairs on foot Home Layout: Two level Home Equipment: Conservation officer, nature (2 wheels)      Prior Function Prior Level of Function : Independent/Modified Independent                     Hand Dominance        Extremity/Trunk Assessment   Upper Extremity Assessment Upper Extremity Assessment: Defer to OT evaluation    Lower Extremity Assessment Lower Extremity Assessment: Generalized weakness       Communication      Cognition Arousal/Alertness: Awake/alert Behavior During Therapy: WFL for tasks assessed/performed Overall Cognitive Status: No family/caregiver present to determine baseline cognitive functioning Area of Impairment: Attention, Following commands, Problem solving, Safety/judgement                   Current Attention Level: Sustained   Following Commands: Follows one step commands with increased time, Follows multi-step commands inconsistently Safety/Judgement: Decreased awareness of safety, Decreased awareness of deficits   Problem Solving: Difficulty sequencing, Requires verbal cues, Requires tactile cues, Decreased initiation, Slow processing General Comments: pt repeatedly states she is seeing the  "oral surgeon" for her wrist,  states she will "be fine at home" when level of assist needed discussed with pt; very poor insight into her current deficits        General  Comments      Exercises     Assessment/Plan    PT Assessment Patient needs continued PT services  PT Problem List Decreased strength;Decreased activity tolerance;Decreased balance;Decreased mobility;Decreased knowledge of precautions;Decreased safety awareness;Decreased knowledge of use of DME       PT Treatment Interventions DME instruction;Therapeutic exercise;Gait training;Functional mobility training;Therapeutic activities;Patient/family education;Balance training    PT Goals (Current goals can be found in the Care Plan section)  Acute Rehab PT Goals Patient Stated Goal: to go home PT Goal Formulation: With patient Time For Goal Achievement: 08/18/22 Potential to Achieve Goals: Good    Frequency Min 3X/week     Co-evaluation               AM-PAC PT "6 Clicks" Mobility  Outcome Measure Help needed turning from your back to your side while in a flat bed without using bedrails?: A Little Help needed moving from lying on your back to sitting on the side of a flat bed without using bedrails?: A Little Help needed moving to and from a bed to a chair (including a wheelchair)?: A Lot Help needed standing up from a chair using your arms (e.g., wheelchair or bedside chair)?: A Lot Help needed to walk in hospital room?: A Lot Help needed climbing 3-5 steps with a  railing? : Total 6 Click Score: 13    End of Session Equipment Utilized During Treatment: Gait belt Activity Tolerance: Patient tolerated treatment well Patient left: with call bell/phone within reach;in chair;with chair alarm set   PT Visit Diagnosis: Other abnormalities of gait and mobility (R26.89);Difficulty in walking, not elsewhere classified (R26.2);History of falling (Z91.81)    Time: 6886-4847 PT Time Calculation (min) (ACUTE ONLY): 26 min   Charges:   PT Evaluation $PT Eval Low Complexity: 1 Low PT Treatments $Therapeutic Activity: 8-22 mins        Baxter Flattery, PT  Acute Rehab Dept [facility])  [phone]  [facility] Weekend Pager [facility] only)  [phone]  08/04/2022   [facility] 08/04/2022, 11:50 AM

## 2022-08-05 DIAGNOSIS — R569 Unspecified convulsions: Secondary | ICD-10-CM | POA: Diagnosis not present

## 2022-08-05 LAB — CBC
HCT: 34.3 % — ABNORMAL LOW (ref 36.0–46.0)
Hemoglobin: 10.9 g/dL — ABNORMAL LOW (ref 12.0–15.0)
MCH: 33.9 pg (ref 26.0–34.0)
MCHC: 31.8 g/dL (ref 30.0–36.0)
MCV: 106.5 fL — ABNORMAL HIGH (ref 80.0–100.0)
Platelets: 257 10*3/uL (ref 150–400)
RBC: 3.22 MIL/uL — ABNORMAL LOW (ref 3.87–5.11)
RDW: 12.6 % (ref 11.5–15.5)
WBC: 3.6 10*3/uL — ABNORMAL LOW (ref 4.0–10.5)
nRBC: 0 % (ref 0.0–0.2)

## 2022-08-05 LAB — BASIC METABOLIC PANEL
Anion gap: 6 (ref 5–15)
BUN: 12 mg/dL (ref 8–23)
CO2: 26 mmol/L (ref 22–32)
Calcium: 8.4 mg/dL — ABNORMAL LOW (ref 8.9–10.3)
Chloride: 104 mmol/L (ref 98–111)
Creatinine, Ser: 0.61 mg/dL (ref 0.44–1.00)
GFR, Estimated: >60 mL/min (ref >60)
Glucose, Bld: 102 mg/dL — ABNORMAL HIGH (ref 70–99)
Potassium: 4.3 mmol/L (ref 3.5–5.1)
Sodium: 136 mmol/L (ref 135–145)

## 2022-08-05 LAB — PROCALCITONIN: Procalcitonin: 0.18 ng/mL

## 2022-08-05 LAB — MAGNESIUM: Magnesium: 2 mg/dL (ref 1.7–2.4)

## 2022-08-05 MED ORDER — GABAPENTIN 300 MG PO CAPS
300.0000 mg | ORAL_CAPSULE | Freq: Every morning | ORAL | Status: DC
  Administered 2022-08-05 – 2022-08-06 (×2): 300 mg via ORAL
  Filled 2022-08-05 (×2): qty 1

## 2022-08-05 NOTE — Progress Notes (Signed)
Mobility Specialist - Progress Note   08/05/22 1401  Mobility  Activity Ambulated with assistance in hallway  Level of Assistance Contact guard assist, steadying assist  Assistive Device Front wheel walker  Distance Ambulated (ft) 60 ft  Activity Response Tolerated well  Mobility Referral Yes  $Mobility charge 1 Mobility   Pt received in room standing w/ nurse and agreeable to mobility. Pt required cues for walker mobility due to L wrist brace. C/o wrist pain during & after ambulation. Nurse notified of occurrence. No other complaints during mobility session. Pt to recliner after session with all needs met & call bell in reach.  [facility]

## 2022-08-05 NOTE — Progress Notes (Signed)
Assumed care f patient from off going RN. No changes in initial am assessment. Cont with plan of care. Patient is oob up in chair/ Offered 1400 ensure/ Pt declined

## 2022-08-05 NOTE — Progress Notes (Signed)
[facility] Progress Note Patient: [name] IRJ:[id number] DOB: 05/27/1956 DOA: 07/28/2022  DOS: the patient was seen and examined on 08/05/2022  Brief hospital course: PMH of asthma, COPD, seizure post craniotomy, GERD, GI bleed, neuropathy, history of DVT, ICH present to the hospital with multiple unresponsive event with loss of control of urination followed by prolonged confusion concerning for seizures.  Patient had a fever thought to be hypoxic.  Given antibiotic in the ED but thought to be a viral infection and therefore no further antibiotics.  12/28 CT abdomen performed due to ongoing right flank pain showing multiple loculation of gas density on the left hepatic lobe without any acute abnormalities.  Also mucous plugging and pleural effusion.  Treated conservatively. 1/1 initiated on oral Augmentin for concern for infection causing hepatic gas loculation.  1/2 discussed with hand surgery Dr. Apolonio Schneiders due to severe pain in left wrist fracture already being treated conservatively since early December.  Recommend outpatient follow-up on Thursday and continue pain management.  Patient refused SNF. Assessment and Plan: History of seizures with multiple breakthrough seizures. Presented with concern for breakthrough seizures.  CT head unremarkable.  CT C-spine unremarkable at the time of admission. Currently mentation significantly better and close to baseline. Thought to be secondary to infection. Continue on home regimen of Dilantin, Vimpat, gabapentin and cenobamate. Follows up with [name] outpatient. Prior provider discussed with neurology.  No indication for EEG or any further imaging.  Liver infection Acute bronchitis Patient presented with cough fever at the time of admission. COVID-negative, influenza negative, RSV negative.  HIV negative.  Respiratory virus pathogen panel negative. CT scan shows mucous plugging. 12/28 CT abdomen showed evidence of loculation of air in the  left liver lobe which was not present on CT scan performed on 12/25. Procalcitonin was elevated, CRP was elevated.  And patient had leukopenia with ongoing right sided pain.  Blood cultures performed, so far negative.  Patient was started on oral Augmentin.  Now abdominal pain is improving. Will continue this antibiotic for 2 weeks and recommend outpatient follow-up CT scan to ensure resolution otherwise patient will require liver biopsy.  Paroxysmal A-fib. Currently rate controlled. Not on any anticoagulation due to history of ICH.  History of stress incontinence. On oxybutynin.  Continue.  B12 deficiency. Vitamin B12 189. Received subcutaneous.  Injection. Now on oral therapy.  Folic acid deficiency. Treated with 1 dose of IV folic acid followed by oral folic acid therapy.  Left radial fracture. After mechanical fall. Seen by Dr. Apolonio Schneiders outpatient. Manage conservatively. Discussed with Dr. Apolonio Schneiders on 1/2.  Recommend to continue conservative management with splint without any intervention for now. Close follow-up on Thursday or Friday was also recommended.  Deconditioning. PT OT recommended SNF.  Patient would rather go home. As long as her pain is well-controlled and her wrist patient will be stable for discharge on 1/3.   Subjective: Severe pain reported.  No nausea no vomiting no fever no chills.  Passing gas.  No BM.  Physical Exam: General: in Mild distress, No Rash Cardiovascular: S1 and S2 Present, No Murmur Respiratory: Good respiratory effort, Bilateral Air entry present. No Crackles, No wheezes Abdomen: Bowel Sound present, improving right flank tenderness Extremities: No edema Neuro: Alert and oriented x3, no new focal deficit  Data Reviewed: I have Reviewed nursing notes, Vitals, and Lab results. Since last encounter, pertinent lab results CBC and BMP   . I have ordered test including CBC and BMP  . I have discussed  pt's care plan and test results with hand  surgery  .   Disposition: Status is: Inpatient Remains inpatient appropriate because: Pain control  enoxaparin (LOVENOX) injection 40 mg Start: 07/29/22 1000 SCDs Start: 07/29/22 3646   Family Communication: Discussed with daughter on the phone on 1/2.  Currently agreeable for plan for discharge home on 1/3. Level of care: Progressive Continue progressive care for now. Vitals:   08/04/22 1206 08/04/22 2037 08/05/22 0507 08/05/22 1200  BP: (!) 99/53 (!) 97/58 (!) 104/51 (!) 105/59  Pulse: (!) 58 76 71 81  Resp: '18 16 19 16  '$ Temp: 97.9 F (36.6 C) 98.4 F (36.9 C) 98.2 F (36.8 C) 98.2 F (36.8 C)  TempSrc: Oral Oral Oral Oral  SpO2: 97% 93% 93% 94%  Weight:      Height:         Author: Berle Mull, MD 08/05/2022 6:09 PM  Please look on [email or web address] to find out who is on call.

## 2022-08-05 NOTE — Hospital Course (Signed)
PMH of asthma, COPD, seizure post craniotomy, GERD, GI bleed, neuropathy, history of DVT, ICH present to the hospital with multiple unresponsive event with loss of control of urination followed by prolonged confusion concerning for seizures.  Patient had a fever thought to be hypoxic.  Given antibiotic in the ED but thought to be a viral infection and therefore no further antibiotics.  12/28 CT abdomen performed due to ongoing right flank pain showing multiple loculation of gas density on the left hepatic lobe without any acute abnormalities.  Also mucous plugging and pleural effusion.  Treated conservatively. 1/1 initiated on oral Augmentin for concern for infection causing hepatic gas loculation.  1/2 discussed with hand surgery Dr. Apolonio Schneiders due to severe pain in left wrist fracture already being treated conservatively since early December.  Recommend outpatient follow-up on Thursday and continue pain management.

## 2022-08-06 DIAGNOSIS — S52515A Nondisplaced fracture of left radial styloid process, initial encounter for closed fracture: Secondary | ICD-10-CM

## 2022-08-06 DIAGNOSIS — E538 Deficiency of other specified B group vitamins: Secondary | ICD-10-CM | POA: Diagnosis present

## 2022-08-06 DIAGNOSIS — I480 Paroxysmal atrial fibrillation: Secondary | ICD-10-CM

## 2022-08-06 DIAGNOSIS — S52515K Nondisplaced fracture of left radial styloid process, subsequent encounter for closed fracture with nonunion: Secondary | ICD-10-CM

## 2022-08-06 DIAGNOSIS — N393 Stress incontinence (female) (male): Secondary | ICD-10-CM | POA: Diagnosis not present

## 2022-08-06 DIAGNOSIS — J209 Acute bronchitis, unspecified: Secondary | ICD-10-CM

## 2022-08-06 DIAGNOSIS — R7989 Other specified abnormal findings of blood chemistry: Secondary | ICD-10-CM | POA: Diagnosis present

## 2022-08-06 DIAGNOSIS — K759 Inflammatory liver disease, unspecified: Secondary | ICD-10-CM

## 2022-08-06 DIAGNOSIS — R569 Unspecified convulsions: Secondary | ICD-10-CM | POA: Diagnosis not present

## 2022-08-06 HISTORY — DX: Acute bronchitis, unspecified: J20.9

## 2022-08-06 HISTORY — DX: Nondisplaced fracture of left radial styloid process, initial encounter for closed fracture: S52.515A

## 2022-08-06 LAB — CBC
HCT: 34.6 % — ABNORMAL LOW (ref 36.0–46.0)
Hemoglobin: 10.7 g/dL — ABNORMAL LOW (ref 12.0–15.0)
MCH: 33.1 pg (ref 26.0–34.0)
MCHC: 30.9 g/dL (ref 30.0–36.0)
MCV: 107.1 fL — ABNORMAL HIGH (ref 80.0–100.0)
Platelets: 270 10*3/uL (ref 150–400)
RBC: 3.23 MIL/uL — ABNORMAL LOW (ref 3.87–5.11)
RDW: 13 % (ref 11.5–15.5)
WBC: 3.3 10*3/uL — ABNORMAL LOW (ref 4.0–10.5)
nRBC: 0 % (ref 0.0–0.2)

## 2022-08-06 LAB — BASIC METABOLIC PANEL
Anion gap: 7 (ref 5–15)
BUN: 10 mg/dL (ref 8–23)
CO2: 26 mmol/L (ref 22–32)
Calcium: 8.4 mg/dL — ABNORMAL LOW (ref 8.9–10.3)
Chloride: 104 mmol/L (ref 98–111)
Creatinine, Ser: 0.67 mg/dL (ref 0.44–1.00)
GFR, Estimated: >60 mL/min (ref >60)
Glucose, Bld: 96 mg/dL (ref 70–99)
Potassium: 4.1 mmol/L (ref 3.5–5.1)
Sodium: 137 mmol/L (ref 135–145)

## 2022-08-06 LAB — MAGNESIUM: Magnesium: 2.1 mg/dL (ref 1.7–2.4)

## 2022-08-06 MED ORDER — AMOXICILLIN-POT CLAVULANATE 875-125 MG PO TABS: 1.0000 | ORAL_TABLET | Freq: Two times a day (BID) | ORAL | 0 refills | Status: AC

## 2022-08-06 MED ORDER — VITAMIN B-12 1000 MCG PO TABS: 1000.0000 ug | ORAL_TABLET | Freq: Every day | ORAL | Status: DC

## 2022-08-06 MED ORDER — GUAIFENESIN 100 MG/5ML PO LIQD: 5.0000 mL | ORAL | 0 refills | Status: DC | PRN

## 2022-08-06 MED ORDER — COMBIVENT RESPIMAT 20-100 MCG/ACT IN AERS: INHALATION_SPRAY | RESPIRATORY_TRACT | 0 refills | Status: DC

## 2022-08-06 MED ORDER — CYANOCOBALAMIN 500 MCG PO TABS: 1000.0000 ug | ORAL_TABLET | Freq: Every day | ORAL | 3 refills | Status: DC

## 2022-08-06 MED ORDER — POLYETHYLENE GLYCOL 3350 17 G PO PACK: 17.0000 g | PACK | Freq: Every day | ORAL | 0 refills | Status: DC

## 2022-08-06 MED ORDER — SENNOSIDES-DOCUSATE SODIUM 8.6-50 MG PO TABS: 1.0000 | ORAL_TABLET | Freq: Two times a day (BID) | ORAL | Status: DC

## 2022-08-06 MED ORDER — BENZONATATE 100 MG PO CAPS: 100.0000 mg | ORAL_CAPSULE | Freq: Three times a day (TID) | ORAL | 0 refills | Status: AC | PRN

## 2022-08-06 MED ORDER — ENSURE ENLIVE PO LIQD: 237.0000 mL | Freq: Two times a day (BID) | ORAL | 12 refills | Status: DC

## 2022-08-06 MED ORDER — GUAIFENESIN ER 600 MG PO TB12: 1200.0000 mg | ORAL_TABLET | Freq: Two times a day (BID) | ORAL | 0 refills | Status: AC

## 2022-08-06 MED ORDER — OXYBUTYNIN CHLORIDE ER 5 MG PO TB24: 5.0000 mg | ORAL_TABLET | Freq: Every day | ORAL | 1 refills | Status: DC

## 2022-08-06 MED ORDER — FOLIC ACID 1 MG PO TABS: 1.0000 mg | ORAL_TABLET | Freq: Every day | ORAL | 3 refills | Status: DC

## 2022-08-06 MED ORDER — BENZONATATE 100 MG PO CAPS: 100.0000 mg | ORAL_CAPSULE | Freq: Three times a day (TID) | ORAL | 0 refills | Status: DC

## 2022-08-06 NOTE — TOC Transition Note (Signed)
Transition of Care [facility]) - CM/SW Discharge Note   Patient Details  Name: [name] MRN: [id number] Date of Birth: [birth year]  Transition of [facility]) CM/SW Contact:  Leeroy Cha, RN Phone Number: 08/06/2022, 3:03 PM   Clinical Narrative:    [facility] will do the hhc   Final next level of care: Home w Home Health Services Barriers to Discharge: Barriers Resolved   Patient Goals and CMS Choice CMS Medicare.gov Compare Post Acute Care list provided to:: Patient Choice offered to / list presented to : Patient  Discharge Placement                         Discharge Plan and Services Additional resources added to the After Visit Summary for     Discharge Planning Services: CM Consult Post Acute Care Choice: Home Health                    HH Arranged: RN, PT, OT, Nurse's Aide [facility] Agency: [facility] Date HH Agency Contacted: 08/06/22 Time [place]: 1310 Representative spoke with at [place]: interim intake  Social Determinants of Health ([place]) Interventions [place]: No Food Insecurity (07/29/2022)  Housing: Low Risk  (07/29/2022)  Transportation Needs: No Transportation Needs (07/29/2022)  Utilities: Not At Risk (07/29/2022)  Tobacco Use: Medium Risk (07/28/2022)     Readmission Risk Interventions   No data to display

## 2022-08-06 NOTE — Discharge Summary (Signed)
Physician Discharge Summary  [name] CBJ:[id number] DOB: 11-16-1955 DOA: 07/28/2022  PCP: [name] Gravel, MD  Admit date: 07/28/2022 Discharge date: 08/06/2022  Time spent: 60 minutes  Recommendations for Outpatient Follow-up:  Follow-up with [name] Gravel, MD in 1 to 2 weeks.  On follow-up patient's liver infection will need to be followed up upon, will need repeat CT scan once antibiotics are completed for follow-up on liver lesions to ensure resolution.  Acute bronchitis will need to be followed up upon.  Folate deficiency, low vitamin B12 levels, stress incontinence will need to be followed up upon.  Patient will need a basic metabolic profile done in 1 week to follow-up on electrolytes and renal function.  Patient will need a CBC done to follow-up on counts. Follow-up with [name], neurology in 3 weeks for follow-up on breakthrough seizures. Follow-up with [name], hand surgeon in 1 to 2 days for follow-up on left radial fracture as well as left ulnar styloid fracture.   Discharge Diagnoses:  Principal Problem:   Seizures ([place]) Active Problems:   Tremor   Thrombocytopenia- chronic   Protein calorie malnutrition (HCC)   COPD (chronic obstructive pulmonary disease) (HCC)   Fever   GERD (gastroesophageal reflux disease)   PAF (paroxysmal atrial fibrillation) (HCC)   Stress incontinence   Folate deficiency   Low vitamin B12 level   Closed nondisplaced fracture of styloid process of left radius   Acute bronchitis   Native liver infection   Discharge Condition: Stable and improved.  Diet recommendation: Regular  Filed Weights   07/28/22 2201  Weight: 68 kg    History of present illness:  HPI per Dr. Bobbye [name] is a 67 y.o. female with medical history significant of asthma, COPD, cerebral hemorrhage, chronic back pain, DDD, DVT, GERD, headaches, history of PUD, history of GI bleed, history of blood transfusion, nephrolithiasis, peripheral neuropathy, tunnel  vision who has been brought to the emergency department due to lethargy and seizures at home.  She had a seizure around 1600 yesterday and was still very lethargic in the evening.  She urinated on herself according to the patient's daughter.  The patient stated she did not feel well yesterday.  She has been having frontal/occipital headache, nonproductive cough with pleuritic CP, mild sore throat, nasal congestion and sneezing.  She has has stress incontinence when coughing or sneezing.  No travel history or sick contacts.  No fever, chills or night sweats. No sore throat, rhinorrhea, dyspnea, wheezing or hemoptysis.  She quit smoking 5 years ago after one of her relatives in [place] had a very serious health issues secondary to smoking.  No palpitations, diaphoresis, PND, orthopnea or pitting edema of the lower extremities.  No appetite changes, abdominal pain, diarrhea, constipation, melena or hematochezia.  No flank pain, dysuria, frequency or hematuria.  No polyuria, polydipsia, polyphagia or blurred vision.   ED course: Initial vital signs were temperature 103.1 F, pulse 99, respirations 20, blood pressure 96/59 mmHg O2 sat 100% on room air.  The patient received acetaminophen 650 mg p.o. x 1, ceftriaxone 1 g IVPB, doxycycline 1 capsule p.o., fentanyl 50 mcg IVP, DuoNeb, Dilantin ER 100 mg p.o. x 1 and 1000 mL of LR bolus.   Lab work: Urinalysis with small hemoglobinuria, small bilirubinuria and proteinuria 30 mg/dL, the rest of the measurements were unremarkable.  Lactic acid, lipase, total CK and magnesium were normal.  Venous blood gas showed normal pH and pCO2, pO2 was less than 31 mmHg, bicarbonate  33.1 and acid-base excess 6.5 mmol/L.  CBC showed a white count of 7.5 with 75% neutrophils, hemoglobin 11.8 g deciliter platelets 142.  CMP showed a glucose of 121 mg deciliter and albumin of 3.3 g/dL.  The rest of the CMP measurements were normal after calcium correction.   Imaging: Portable 1 chest  radiograph with no active disease.  CT head without contrast with no acute intracranial normality.  There is encephalomalacia of the right occipital lobe and a spots "dilated Tatian of the occipital horn.  Right occipital craniectomy changes.  Hospital Course:  #1 history of seizures with multiple breakthrough seizures -Patient noted on presentation to have presented with symptoms of breakthrough seizures with multiple unresponsive events and loss of control of urination followed by postictal state. -CT head done negative for any acute abnormalities.  CT C-spine unremarkable. -Patient maintained on home regimen, improved clinically, and felt seizures may have been provoked by acute infection of liver infection and acute bronchitis. -Patient remained in stable condition and will follow-up with outpatient neurologist [name] in 3 weeks. -Prior hospitalist discussed case with neurology who felt no indication for EEG or any further imaging. -Patient remained in stable condition will be discharged in stable and improved condition.  2.  Liver infection/acute bronchitis -Patient noted on admission presented with fever cough time of admission, COVID-19 PCR negative, influenza PCR negative, RSV negative, HIV negative.  Respiratory viral panel negative. -CT abdomen and pelvis done was consistent with mucous plugging. -CT abdomen and pelvis repeat done on 07/31/2022 showed evidence of minuscule loculations of gas density along the periphery of the left hepatic lobe, uncertain localization as to whether this is biliary, portal venous or other negative for bowel pneumatosis or other findings of portal venous gas to indicate ischemia.  Airway thickening in both lower lobes with airway plugging.  Trace left pleural effusion.  Stable 4 mm nodule in the posterior basal segment of the left lower lobe.  Stability from 03/24/2018 indicates benign lesion warranting no further workup.  Mild lumbar spondylosis and DDD.   Small umbilical hernia containing adipose tissue. -Patient noted to have elevated procalcitonin level that trended down, CRP elevated, patient noted to have a leukopenia with ongoing right-sided pain.  Blood cultures with no growth to date.  Patient improved after being started on oral Augmentin. -Patient also placed on cough medication, will be discharged on Mucinex twice daily as well as Combivent. -Patient will be discharged on 12 more days of oral Augmentin to complete a 2-week antibiotic course and will need outpatient follow-up for repeat CT scan to ensure resolution otherwise may require further evaluation with liver biopsy versus aspiration. -Patient improved clinically and will be discharged in stable and improved condition.  3.  Paroxysmal atrial fibrillation -Remained rate showed during the hospitalization. -Patient not on anticoagulation due to history of ICH.  4.  History of stress incontinence -Patient maintained on oxybutynin. -Outpatient follow-up with PCP.  5.  Folate deficiency/low vitamin B12 levels -Vitamin B12 noted at 189.  Patient received subcutaneous vitamin B12 during the hospitalization as well as oral folic acid. -Patient will be discharged on oral vitamin C14 and folic acid supplementation. -Outpatient follow-up with PCP.  6.  Left radial fracture/acute nondisplaced ulnar styloid fracture -Noted after mechanical fall. -Patient noted to have been seen by [name] in the outpatient setting who recommended conservative management per Dr. Posey Pronto. -Outpatient follow-up with [name], hand surgery in 1 to 2 days postdischarge. -Patient noted to have splint on which  she will be discharged on.  7.  Deconditioning -Patient seen by PT/OT who recommended SNF however patient absolutely refused SNF and will be discharged home with home health therapies.   Procedures: CT abdomen and pelvis 07/29/2022, 07/31/2022 CT C-spine 07/29/2022 CT head 07/28/2022 Plain films  of the left forearm 08/04/2022 Chest x-ray 07/31/2022, 07/28/2022   Consultations: None  Discharge Exam: Vitals:   08/06/22 0524 08/06/22 1247  BP: (!) 103/51 135/72  Pulse: 77 84  Resp: 18 18  Temp: 98 F (36.7 C) (!) 97.3 F (36.3 C)  SpO2: 96% 93%    General: NAD. Cardiovascular: RRR no murmurs rubs or gallops.  No JVD.  No lower extremity edema. Respiratory: Clear to auscultation bilaterally.  No wheezes, no crackles, no rhonchi.  Fair air movement.  Speaking in full sentences.  Discharge Instructions   Discharge Instructions     Diet general   Complete by: As directed    Increase activity slowly   Complete by: As directed       Allergies as of 08/06/2022       Reactions   Zonisamide Other (See Comments)   Numbness and tingling over whole body   Chocolate Flavor Other (See Comments)   Trigger for seizures   Codeine Nausea And Vomiting   Keppra [levetiracetam] Other (See Comments)   Causes seizures   Pregabalin Nausea And Vomiting   Olive Oil Rash        Medication List     STOP taking these medications    etodolac 300 MG capsule Commonly known as: LODINE       TAKE these medications    acetaminophen 500 MG tablet Commonly known as: TYLENOL Take 1,000 mg by mouth every 6 (six) hours as needed for headache.   amoxicillin-clavulanate 875-125 MG tablet Commonly known as: AUGMENTIN Take 1 tablet by mouth every 12 (twelve) hours for 12 days.   benzonatate 100 MG capsule Commonly known as: TESSALON Take 1 capsule (100 mg total) by mouth 3 (three) times daily as needed for up to 10 days for cough.   cholecalciferol 25 MCG (1000 UNIT) tablet Commonly known as: VITAMIN D3 Take 1 tablet (1,000 Units total) by mouth daily.   Combivent Respimat 20-100 MCG/ACT Aers respimat Generic drug: Ipratropium-Albuterol Inhale 1 puff into the lungs 3 (three) times daily for 5 days, THEN 1 puff every 6 (six) hours as needed for up to 7 days for wheezing or  shortness of breath. Start taking on: August 06, 2022   cyanocobalamin 500 MCG tablet Commonly known as: [employer] B-12 Take 2 tablets (1,000 mcg total) by mouth daily.   Dilantin 100 MG ER capsule Generic drug: phenytoin TAKE 1 CAPSULE BY MOUTH TWICE DAILY   Dilantin 30 MG ER capsule Generic drug: phenytoin TAKE 1 CAPSULE BY MOUTH NIGHTLY ALONG WITH 100 MG CAPSULE FOR A TOTAL NIGHTLY DOSE OF '130MG'$    feeding supplement Liqd Take 237 mLs by mouth 2 (two) times daily between meals. Start taking on: August 07, 930   folic acid 1 MG tablet Commonly known as: FOLVITE Take 1 tablet (1 mg total) by mouth daily.   gabapentin 300 MG capsule Commonly known as: NEURONTIN Take 2 capsule every night What changed:  how much to take how to take this when to take this additional instructions   guaiFENesin 100 MG/5ML liquid Commonly known as: ROBITUSSIN Take 5 mLs by mouth every 4 (four) hours as needed for cough or to loosen phlegm.   guaiFENesin 600 MG  12 hr tablet Commonly known as: Mucinex Take 2 tablets (1,200 mg total) by mouth 2 (two) times daily for 10 days.   LORazepam 1 MG tablet Commonly known as: ATIVAN TAKE 1 TABLET BY MOUTH AS DIRECTED AS NEEDED FOR 3 OR MORE SEIZURES IN 24 HOURS. DO NOT TAKE MORE THAN 2 TABLETS IN 24 HOURS.   oxybutynin 5 MG 24 hr tablet Commonly known as: DITROPAN-XL Take 1 tablet (5 mg total) by mouth at bedtime.   polyethylene glycol 17 g packet Commonly known as: MIRALAX / GLYCOLAX Take 17 g by mouth daily. Start taking on: August 07, 2022   senna-docusate 8.6-50 MG tablet Commonly known as: Senokot-S Take 1 tablet by mouth 2 (two) times daily.   Vimpat 200 MG Tabs tablet Generic drug: lacosamide Take 1 tablet (200 mg total) by mouth 2 (two) times daily.   Xcopri 150 MG Tabs Generic drug: Cenobamate Take 1 tablet every night       Allergies  Allergen Reactions   Zonisamide Other (See Comments)    Numbness and tingling over whole  body   Chocolate Flavor Other (See Comments)    Trigger for seizures   Codeine Nausea And Vomiting   Keppra [Levetiracetam] Other (See Comments)    Causes seizures   Pregabalin Nausea And Vomiting   Olive Oil Rash    Follow-up Information     [name] Gravel, MD. Call in 2 week(s).   Specialty: Internal Medicine Why: Follow-up in 1 to 2 weeks. Contact information: [address] [place] [address] [phone]         [name], MD. Schedule an appointment as soon as possible for a visit in 3 week(s).   Specialty: Neurology Why: Follow-up for breakthrough seizures. Contact information: [address] [place] [address] [phone]         Iran Planas, MD Follow up in 1 day(s).   Specialty: Orthopedic Surgery Why: Follow-up in 1 to 2 days. Contact information: [address] [place] [address] [phone]                  The results of significant diagnostics from this hospitalization (including imaging, microbiology, ancillary and laboratory) are listed below for reference.    Significant Diagnostic Studies: DG Forearm Left  Result Date: 08/04/2022 CLINICAL DATA:  Known left radial fracture. Proximal radiating pain. EXAM: LEFT FOREARM - 2 VIEW COMPARISON:  Left hand radiographs 08/02/2020 FINDINGS: There is a transverse fracture of the distal radial metaphysis and epiphyseal region with moderate impaction. 3 mm lateral, 2 mm medial, and 5 mm dorsal cortical step-off of the fracture components. Otherwise, no significant dislocation. There is a subtle longitudinal linear lucency within the distal ulnar styloid suggesting a nondisplaced acute fracture. Moderate diffuse wrist soft tissue swelling. The elbow joint is appropriately aligned. No elbow joint effusion is seen. No acute fracture is seen within the more proximal radius or ulna. IMPRESSION: 1. Acute transverse fracture of the distal radial metaphysis  and epiphyseal region with moderate impaction and mild peripheral medial, lateral, and dorsal cortical step-off. 2. Acute nondisplaced ulnar styloid fracture. Electronically Signed   By: Yvonne Kendall M.D.   On: 08/04/2022 12:01   CT ABDOMEN PELVIS WO CONTRAST  Result Date: 07/31/2022 CLINICAL DATA:  Abdominal pain EXAM: CT ABDOMEN AND PELVIS WITHOUT CONTRAST TECHNIQUE: Multidetector CT imaging of the abdomen and pelvis was performed following the standard protocol without IV contrast. RADIATION DOSE REDUCTION: This exam  was performed according to the departmental dose-optimization program which includes automated exposure control, adjustment of the mA and/or kV according to patient size and/or use of iterative reconstruction technique. COMPARISON:  07/29/2022 FINDINGS: Lower chest: Airway thickening in both lower lobes associated airway plugging. Stable 4 mm nodule in the posterior basal segment left lower lobe, image 20 series 6. Trace left pleural effusion, increased from previous. Hepatobiliary: Miniscule new loculations of gas density along the periphery of the left hepatic lobe for example image 13 series 2, uncertain localization as to whether this is biliary, portal venous, or other. I do not see any bowel pneumatosis or other findings of portal venous gas, but correlation with lactate level would be suggested. Gallbladder unremarkable. Common bile duct mildly prominent 0.9 cm in diameter on image 26 series 2, formerly about 0.8 cm on 07/29/2022. Pancreas: Unremarkable Spleen: Unremarkable Adrenals/Urinary Tract: Unremarkable Stomach/Bowel: Suspected periampullary duodenal diverticulum. As noted above, there no findings of pneumatosis. Redundant sigmoid colon extends cephalad but does not appear twisted or dilated. No dilated small bowel. Vascular/Lymphatic: Atherosclerosis is present, including aortoiliac atherosclerotic disease. Infrarenal IVC filter. Collateral venous structures along the pubis.  Reproductive: Unremarkable Other: Small collections of subcutaneous gas along the anterior abdominal wall, probably related to injections. Musculoskeletal: Mild lumbar spondylosis and degenerative disc disease. Small umbilical hernia contains adipose tissue. IMPRESSION: 1. Miniscule loculations of gas density along the periphery of the left hepatic lobe, uncertain localization as to whether this is biliary, portal venous, or other. I do not see any bowel pneumatosis or other findings of portal venous gas to indicate ischemia, but correlation with lactate level would be suggested. 2. Airway thickening in both lower lobes with airway plugging. 3. Trace left pleural effusion, increased from previous. 4. Stable 4 mm nodule in the posterior basal segment left lower lobe. Stability from 03/24/2018 indicates that this is a benign lesion warranting no further workup. 5. Mild lumbar spondylosis and degenerative disc disease. 6. Small umbilical hernia contains adipose tissue. 7. Aortic atherosclerosis. Aortic Atherosclerosis (ICD10-I70.0). Electronically Signed   By: Van Clines M.D.   On: 07/31/2022 18:22   DG CHEST PORT 1 VIEW  Result Date: 07/31/2022 CLINICAL DATA:  Cough EXAM: PORTABLE CHEST 1 VIEW COMPARISON:  Chest radiograph 07/28/2022. FINDINGS: The heart size and mediastinal contours are within normal limits. Both lungs are clear. No visible pleural effusions or pneumothorax. No acute osseous abnormality. IMPRESSION: No active disease. Electronically Signed   By: Margaretha Sheffield M.D.   On: 07/31/2022 11:33   CT Abdomen Pelvis W Contrast  Result Date: 07/29/2022 CLINICAL DATA:  67 year old female with history of mid abdominal pain. Seizure. EXAM: CT ABDOMEN AND PELVIS WITH CONTRAST TECHNIQUE: Multidetector CT imaging of the abdomen and pelvis was performed using the standard protocol following bolus administration of intravenous contrast. RADIATION DOSE REDUCTION: This exam was performed according  to the departmental dose-optimization program which includes automated exposure control, adjustment of the mA and/or kV according to patient size and/or use of iterative reconstruction technique. CONTRAST:  113m OMNIPAQUE IOHEXOL 300 MG/ML  SOLN COMPARISON:  CT of the chest, abdomen and pelvis 08/02/2020. FINDINGS: Lower chest: 4 mm left lower lobe pulmonary nodule (axial image 6 of series 5), stable compared to prior examination 08/02/2020, considered benign. Hepatobiliary: No suspicious cystic or solid hepatic lesions. No intra or extrahepatic biliary ductal dilatation. Gallbladder is normal in appearance. Pancreas: No pancreatic mass. No pancreatic ductal dilatation. No pancreatic or peripancreatic fluid collections or inflammatory changes. Spleen: Unremarkable. Adrenals/Urinary Tract: Bilateral  kidneys and bilateral adrenal glands are normal in appearance. No hydroureteronephrosis. Urinary bladder is nearly completely decompressed, but otherwise unremarkable in appearance. Stomach/Bowel: The appearance of the stomach is normal. There is no pathologic dilatation of small bowel or colon. Normal appendix. Vascular/Lymphatic: Aortic atherosclerosis, without evidence of aneurysm or dissection in the abdominal or pelvic vasculature. IVC filter noted, with tip terminating shortly below the level of the renal veins. No lymphadenopathy noted in the abdomen or pelvis. Reproductive: Uterus and ovaries are unremarkable in appearance. Other: No significant volume of ascites.  No pneumoperitoneum. Musculoskeletal: There are no aggressive appearing lytic or blastic lesions noted in the visualized portions of the skeleton. IMPRESSION: 1. No acute findings are noted in the abdomen or pelvis to account for the patient's symptoms. 2. Aortic atherosclerosis. 3. Additional incidental findings, as above. Electronically Signed   By: Vinnie Langton M.D.   On: 07/29/2022 06:48   CT Cervical Spine Wo Contrast  Result Date:  07/29/2022 CLINICAL DATA:  Neck trauma with impaired range of motion.  Seizure EXAM: CT CERVICAL SPINE WITHOUT CONTRAST TECHNIQUE: Multidetector CT imaging of the cervical spine was performed without intravenous contrast. Multiplanar CT image reconstructions were also generated. RADIATION DOSE REDUCTION: This exam was performed according to the departmental dose-optimization program which includes automated exposure control, adjustment of the mA and/or kV according to patient size and/or use of iterative reconstruction technique. COMPARISON:  Ten days ago FINDINGS: Alignment: Normal Skull base and vertebrae: No acute fracture. No primary bone lesion or focal pathologic process. Soft tissues and spinal canal: No prevertebral fluid or swelling. No visible canal hematoma. Disc levels: Mid cervical facet and lower cervical disc degenerative ridging. No evidence of high-grade spinal canal stenosis. Upper chest: Biapical emphysema. IMPRESSION: No evidence of cervical spine injury. Electronically Signed   By: Jorje Guild M.D.   On: 07/29/2022 06:22   CT Head Wo Contrast  Result Date: 07/28/2022 CLINICAL DATA:  Head trauma, moderate-to-severe. Patient had witnessed seizures around 1,600. EXAM: CT HEAD WITHOUT CONTRAST TECHNIQUE: Contiguous axial images were obtained from the base of the skull through the vertex without intravenous contrast. RADIATION DOSE REDUCTION: This exam was performed according to the departmental dose-optimization program which includes automated exposure control, adjustment of the mA and/or kV according to patient size and/or use of iterative reconstruction technique. COMPARISON:  CT head dated July 18, 2022 FINDINGS: Brain: No evidence of acute infarction, hemorrhage, hydrocephalus, extra-axial collection or mass lesion/mass effect. Encephalomalacia of the right occipital lobe and ex vacuo dilatation of the occipital horn. Vascular: No hyperdense vessel or unexpected calcification.  Skull: Right occipital craniectomy changes. No acute fracture or significant soft tissue changes. Sinuses/Orbits: No acute finding. Other: None. IMPRESSION: 1. No acute intracranial abnormality. 2. Encephalomalacia of the right occipital lobe and ex vacuo dilatation of the occipital horn. 3. Right occipital craniectomy changes. Electronically Signed   By: Keane Police D.O.   On: 07/28/2022 23:17   DG Chest Portable 1 View  Result Date: 07/28/2022 CLINICAL DATA:  Lethargy and seizures EXAM: PORTABLE CHEST 1 VIEW COMPARISON:  07/18/2022 FINDINGS: Stable cardiomediastinal silhouette. Aortic atherosclerotic calcification. No focal consolidation, pleural effusion, or pneumothorax. No acute osseous abnormality. IMPRESSION: No active disease. Electronically Signed   By: Placido Sou M.D.   On: 07/28/2022 22:13   CT [name]  Result Date: 07/18/2022 CLINICAL DATA:  Low back pain after fall 1 week ago EXAM: CT LUMBAR SPINE WITHOUT CONTRAST TECHNIQUE: Multidetector CT imaging of the lumbar spine was  performed without intravenous contrast administration. Multiplanar CT image reconstructions were also generated. RADIATION DOSE REDUCTION: This exam was performed according to the departmental dose-optimization program which includes automated exposure control, adjustment of the mA and/or kV according to patient size and/or use of iterative reconstruction technique. COMPARISON:  CT 04/28/2022 FINDINGS: Segmentation: 5 lumbar type vertebrae. Alignment: No traumatic listhesis. Vertebrae: No acute fracture. Unchanged mild physiologic wedging of the T11, T12, and L1 vertebrae. No lytic or sclerotic bone lesion. Paraspinal and other soft tissues: Aortic atherosclerosis. No acute findings are seen. Disc levels: Similar degree of mild multilevel lumbar spondylosis. No evidence of high-grade foraminal or canal stenosis. IMPRESSION: 1. No acute fracture or traumatic listhesis of the lumbar spine. 2. Similar  degree of mild multilevel lumbar spondylosis. 3. Aortic atherosclerosis (ICD10-I70.0). Electronically Signed   By: Davina Poke D.O.   On: 07/18/2022 16:32   CT Head Wo Contrast  Result Date: 07/18/2022 CLINICAL DATA:  Neck trauma EXAM: CT HEAD WITHOUT CONTRAST CT CERVICAL SPINE WITHOUT CONTRAST TECHNIQUE: Multidetector CT imaging of the head and cervical spine was performed following the standard protocol without intravenous contrast. Multiplanar CT image reconstructions of the cervical spine were also generated. RADIATION DOSE REDUCTION: This exam was performed according to the departmental dose-optimization program which includes automated exposure control, adjustment of the mA and/or kV according to patient size and/or use of iterative reconstruction technique. COMPARISON:  CT Head 08/10/20 FINDINGS: CT HEAD FINDINGS Brain: Postsurgical changes from a right parietal craniotomy with slightly sunken appearance of the craniotomy flap, unchanged from prior exam. There encephalomalacia in the right occipital lobe, unchanged from prior exam. No evidence of an infarct. No hemorrhage. No hydrocephalus. There is ex vacuo dilatation of the right occipital horn, unchanged from prior exam. Vascular: No hyperdense vessel or unexpected calcification. Skull: Right parietal craniotomy. Sinuses/Orbits: No acute finding. Other: None. CT CERVICAL SPINE FINDINGS Alignment: Trace retrolisthesis of C3 on C4. Skull base and vertebrae: No acute fracture. No primary bone lesion or focal pathologic process. Soft tissues and spinal canal: No prevertebral fluid or swelling. No visible canal hematoma. Disc levels:  No evidence of high-grade spinal canal stenosis. Upper chest: Moderate centrilobular emphysema. Other: None IMPRESSION: 1. No acute intracranial abnormality. 2. No acute cervical spine fracture. Emphysema (ICD10-J43.9). Electronically Signed   By: Marin Roberts M.D.   On: 07/18/2022 16:26   CT Cervical Spine Wo  Contrast  Result Date: 07/18/2022 CLINICAL DATA:  Neck trauma EXAM: CT HEAD WITHOUT CONTRAST CT CERVICAL SPINE WITHOUT CONTRAST TECHNIQUE: Multidetector CT imaging of the head and cervical spine was performed following the standard protocol without intravenous contrast. Multiplanar CT image reconstructions of the cervical spine were also generated. RADIATION DOSE REDUCTION: This exam was performed according to the departmental dose-optimization program which includes automated exposure control, adjustment of the mA and/or kV according to patient size and/or use of iterative reconstruction technique. COMPARISON:  CT Head 08/10/20 FINDINGS: CT HEAD FINDINGS Brain: Postsurgical changes from a right parietal craniotomy with slightly sunken appearance of the craniotomy flap, unchanged from prior exam. There encephalomalacia in the right occipital lobe, unchanged from prior exam. No evidence of an infarct. No hemorrhage. No hydrocephalus. There is ex vacuo dilatation of the right occipital horn, unchanged from prior exam. Vascular: No hyperdense vessel or unexpected calcification. Skull: Right parietal craniotomy. Sinuses/Orbits: No acute finding. Other: None. CT CERVICAL SPINE FINDINGS Alignment: Trace retrolisthesis of C3 on C4. Skull base and vertebrae: No acute fracture. No primary bone lesion or focal pathologic process.  Soft tissues and spinal canal: No prevertebral fluid or swelling. No visible canal hematoma. Disc levels:  No evidence of high-grade spinal canal stenosis. Upper chest: Moderate centrilobular emphysema. Other: None IMPRESSION: 1. No acute intracranial abnormality. 2. No acute cervical spine fracture. Emphysema (ICD10-J43.9). Electronically Signed   By: Marin Roberts M.D.   On: 07/18/2022 16:26   DG Chest 2 View  Result Date: 07/18/2022 CLINICAL DATA:  Shortness of breath EXAM: CHEST - 2 VIEW COMPARISON:  Radiograph 04/27/2022 FINDINGS: Unchanged cardiomediastinal silhouette. There is no focal  airspace consolidation. Pulmonary hyperinflation. There is no pleural effusion or evidence of pneumothorax. Thoracic spondylosis. Chronic right distal clavicle injury. No acute osseous abnormality. Partially visualized IVC filter. IMPRESSION: Findings of COPD.  No focal airspace disease. Electronically Signed   By: Maurine Simmering M.D.   On: 07/18/2022 14:23    Microbiology: Recent Results (from the past 240 hour(s))  Resp panel by RT-PCR (RSV, Flu A&B, Covid) Anterior Nasal Swab     Status: None   Collection Time: 07/28/22 10:25 PM   Specimen: Anterior Nasal Swab  Result Value Ref Range Status   SARS Coronavirus 2 by RT PCR NEGATIVE NEGATIVE Final    Comment: (NOTE) SARS-CoV-2 target nucleic acids are NOT DETECTED.  The SARS-CoV-2 RNA is generally detectable in upper respiratory specimens during the acute phase of infection. The lowest concentration of SARS-CoV-2 viral copies this assay can detect is 138 copies/mL. A negative result does not preclude SARS-Cov-2 infection and should not be used as the sole basis for treatment or other patient management decisions. A negative result may occur with  improper specimen collection/handling, submission of specimen other than nasopharyngeal swab, presence of viral mutation(s) within the areas targeted by this assay, and inadequate number of viral copies(<138 copies/mL). A negative result must be combined with clinical observations, patient history, and epidemiological information. The expected result is Negative.  Fact Sheet for Patients:  EntrepreneurPulse.com.au  Fact Sheet for Healthcare Providers:  IncredibleEmployment.be  This test is no t yet approved or cleared by the [place] FDA and  has been authorized for detection and/or diagnosis of SARS-CoV-2 by FDA under an Emergency Use Authorization (EUA). This EUA will remain  in effect (meaning this test can be used) for the duration of the COVID-19  declaration under Section 564(b)(1) of the Act, 21 U.S.C.section 360bbb-3(b)(1), unless the authorization is terminated  or revoked sooner.       Influenza A by PCR NEGATIVE NEGATIVE Final   Influenza B by PCR NEGATIVE NEGATIVE Final    Comment: (NOTE) The Xpert Xpress SARS-CoV-2/FLU/RSV plus assay is intended as an aid in the diagnosis of influenza from Nasopharyngeal swab specimens and should not be used as a sole basis for treatment. Nasal washings and aspirates are unacceptable for Xpert Xpress SARS-CoV-2/FLU/RSV testing.  Fact Sheet for Patients: EntrepreneurPulse.com.au  Fact Sheet for Healthcare Providers: IncredibleEmployment.be  This test is not yet approved or cleared by the [place] FDA and has been authorized for detection and/or diagnosis of SARS-CoV-2 by FDA under an Emergency Use Authorization (EUA). This EUA will remain in effect (meaning this test can be used) for the duration of the COVID-19 declaration under Section 564(b)(1) of the Act, 21 U.S.C. section 360bbb-3(b)(1), unless the authorization is terminated or revoked.     Resp Syncytial Virus by PCR NEGATIVE NEGATIVE Final    Comment: (NOTE) Fact Sheet for Patients: EntrepreneurPulse.com.au  Fact Sheet for Healthcare Providers: IncredibleEmployment.be  This test is not yet approved or cleared  by the [place] and has been authorized for detection and/or diagnosis of SARS-CoV-2 by FDA under an Emergency Use Authorization (EUA). This EUA will remain in effect (meaning this test can be used) for the duration of the COVID-19 declaration under Section 564(b)(1) of the Act, 21 U.S.C. section 360bbb-3(b)(1), unless the authorization is terminated or revoked.  Performed at [facility], [place] [address], [place], [place] [address]   Respiratory (~20 pathogens) panel by PCR     Status: None   Collection  Time: 07/29/22  5:04 AM   Specimen: Nasopharyngeal Swab; Respiratory  Result Value Ref Range Status   Adenovirus NOT DETECTED NOT DETECTED Final   Coronavirus 229E NOT DETECTED NOT DETECTED Final    Comment: (NOTE) The Coronavirus on the Respiratory Panel, DOES NOT test for the novel  Coronavirus (2019 nCoV)    Coronavirus HKU1 NOT DETECTED NOT DETECTED Final   Coronavirus NL63 NOT DETECTED NOT DETECTED Final   Coronavirus OC43 NOT DETECTED NOT DETECTED Final   Metapneumovirus NOT DETECTED NOT DETECTED Final   Rhinovirus / Enterovirus NOT DETECTED NOT DETECTED Final   Influenza A NOT DETECTED NOT DETECTED Final   Influenza B NOT DETECTED NOT DETECTED Final   Parainfluenza Virus 1 NOT DETECTED NOT DETECTED Final   Parainfluenza Virus 2 NOT DETECTED NOT DETECTED Final   Parainfluenza Virus 3 NOT DETECTED NOT DETECTED Final   Parainfluenza Virus 4 NOT DETECTED NOT DETECTED Final   Respiratory Syncytial Virus NOT DETECTED NOT DETECTED Final   Bordetella pertussis NOT DETECTED NOT DETECTED Final   Bordetella Parapertussis NOT DETECTED NOT DETECTED Final   Chlamydophila pneumoniae NOT DETECTED NOT DETECTED Final   Mycoplasma pneumoniae NOT DETECTED NOT DETECTED Final    Comment: Performed at [facility], [place]. [address], [place], [place] [address]  Culture, blood (Routine X 2) w Reflex to ID Panel     Status: None (Preliminary result)   Collection Time: 08/03/22 11:04 AM   Specimen: BLOOD  Result Value Ref Range Status   Specimen Description   Final    BLOOD BLOOD LEFT ARM Performed at [place] [address] [place], [place] [address]    Special Requests   Final    BOTTLES DRAWN AEROBIC AND ANAEROBIC Blood Culture adequate volume Performed at [place] [address] [place], [place] [address]    Culture   Final    NO GROWTH 3 DAYS Performed at [facility], [place] [address], [place], [place] [address]    Report Status  PENDING  Incomplete  Culture, blood (Routine X 2) w Reflex to ID Panel     Status: None (Preliminary result)   Collection Time: 08/03/22 11:04 AM   Specimen: BLOOD  Result Value Ref Range Status   Specimen Description   Final    BLOOD BLOOD RIGHT ARM Performed at [place] [address] [place], [place] [address]    Special Requests   Final    BOTTLES DRAWN AEROBIC AND ANAEROBIC Blood Culture adequate volume Performed at [place] [address], [place], [place] [address]    Culture   Final    NO GROWTH 3 DAYS Performed at [facility], [place] [address] [place], [place] [address]    Report Status PENDING  Incomplete     Labs: Basic Metabolic Panel: Recent Labs  Lab 08/01/22 0733 08/02/22 0514 08/03/22 0412 08/05/22 0356 08/06/22 0423  NA  137 141 137 136 137  K 4.0 4.8 4.5 4.3 4.1  CL 106 108 106 104 104  CO2 '26 27 24 26 26  '$ GLUCOSE 98 88 101* 102* 96  BUN 5* '9 9 12 10  '$ CREATININE 0.57 0.65 0.59 0.61 0.67  CALCIUM 8.5* 8.6* 8.3* 8.4* 8.4*  MG  --   --   --  2.0 2.1   Liver Function Tests: Recent Labs  Lab 08/03/22 0412  AST 16  ALT 14  ALKPHOS 52  BILITOT 0.2*  PROT 5.7*  ALBUMIN 2.5*   No results for input(s): "LIPASE", "AMYLASE" in the last 168 hours. No results for input(s): "AMMONIA" in the last 168 hours. CBC: Recent Labs  Lab 08/02/22 0514 08/03/22 0412 08/03/22 1104 08/05/22 0356 08/06/22 0423  WBC 3.9* 4.3 3.8* 3.6* 3.3*  NEUTROABS  --   --  2.4  --   --   HGB 12.1 8.6* 11.1* 10.9* 10.7*  HCT 37.6 27.1* 34.6* 34.3* 34.6*  MCV 106.5* 107.5* 106.5* 106.5* 107.1*  PLT 194 209 227 257 270   Cardiac Enzymes: No results for input(s): "CKTOTAL", "CKMB", "CKMBINDEX", "TROPONINI" in the last 168 hours. BNP: BNP (last 3 results) No results for input(s): "BNP" in the last 8760 hours.  ProBNP (last 3 results) No results for input(s): "PROBNP" in the last 8760 hours.  CBG: No results for  input(s): "GLUCAP" in the last 168 hours.     Signed:  Irine Seal MD.  [facility] 08/06/2022, 3:11 PM

## 2022-08-06 NOTE — TOC Progression Note (Addendum)
Transition of Care [facility]) - Progression Note    Patient Details  Name: SOFI BRYARS MRN: [id number] Date of Birth: 1956/06/26  [facility] of [facility]) CM/SW Contact  Leeroy Cha, RN Phone Number: 08/06/2022, 11:57 AM  Clinical Narrative:     Spoke with patient she is refusing snf [facility].  States that her daughter is at home 24/7 and helps her at home.  Has agreed to using [facility] does not have a preference. Texted information to [facility] unable to service due to [employer].  Information sent to [facility]-unable to staff. Sent to [facility]-unable to take insurance Sent to [facility]-unable to accept  Sent to [facility]-unable to accept. Tct-and fax to [facility]- Can do the pt and rn no aide available and no ot.  Patient mainly needs the pt and rn.  Information and orders faxed to [facility] at [phone]. Expected Discharge Plan and Services                                               Social Determinants of Health (SDOH) Interventions SDOH Screenings   Food Insecurity: No Food Insecurity (07/29/2022)  Housing: Low Risk  (07/29/2022)  Transportation Needs: No Transportation Needs (07/29/2022)  Utilities: Not At Risk (07/29/2022)  Tobacco Use: Medium Risk (07/28/2022)    Readmission Risk Interventions   No data to display

## 2022-08-08 LAB — CULTURE, BLOOD (ROUTINE X 2)
Culture: NO GROWTH
Culture: NO GROWTH
Special Requests: ADEQUATE
Special Requests: ADEQUATE

## 2022-08-15 ENCOUNTER — Telehealth: Payer: Self-pay | Admitting: Anesthesiology

## 2022-08-15 ENCOUNTER — Encounter (HOSPITAL_COMMUNITY): Payer: Self-pay | Admitting: Orthopedic Surgery

## 2022-08-15 ENCOUNTER — Encounter (HOSPITAL_COMMUNITY): Payer: Self-pay | Admitting: Physician Assistant

## 2022-08-15 NOTE — Telephone Encounter (Signed)
Pt daughter Is asking if you will sign approval for surgery if they send it because she has a broken wrist and they said because she was in the hospital she needs to be seen. She was in the hospital because she had a temp of 103 with an infection when she had the seizure. Pt daughter said she has a follow up with PCP because of lesions seen on her liver.

## 2022-08-15 NOTE — Telephone Encounter (Signed)
Pls let her know patient will be put on cancellation list, thanks

## 2022-08-15 NOTE — Telephone Encounter (Signed)
Pt's daughter [name] called stating pt was released from hospital after having a seizure and was told to follow up with Dr Delice Lesch. She would like to bring in her mother in soon. Requests call back.

## 2022-08-18 ENCOUNTER — Ambulatory Visit (HOSPITAL_COMMUNITY): Admission: RE | Admit: 2022-08-18 | Payer: Medicare Other | Source: Home / Self Care | Admitting: Orthopedic Surgery

## 2022-08-18 ENCOUNTER — Encounter (HOSPITAL_COMMUNITY): Admission: RE | Payer: Self-pay | Source: Home / Self Care

## 2022-08-18 HISTORY — DX: Personal history of urinary calculi: Z87.442

## 2022-08-18 SURGERY — OPEN REDUCTION INTERNAL FIXATION (ORIF) DISTAL RADIUS FRACTURE
Anesthesia: Regional | Laterality: Left

## 2022-08-20 ENCOUNTER — Other Ambulatory Visit: Payer: Self-pay | Admitting: Neurology

## 2022-08-21 ENCOUNTER — Encounter: Payer: Self-pay | Admitting: Neurology

## 2022-08-21 ENCOUNTER — Telehealth (INDEPENDENT_AMBULATORY_CARE_PROVIDER_SITE_OTHER): Payer: Medicare Other | Admitting: Neurology

## 2022-08-21 VITALS — Ht 67.0 in | Wt 140.0 lb

## 2022-08-21 DIAGNOSIS — G40219 Localization-related (focal) (partial) symptomatic epilepsy and epileptic syndromes with complex partial seizures, intractable, without status epilepticus: Secondary | ICD-10-CM | POA: Diagnosis not present

## 2022-08-21 DIAGNOSIS — G629 Polyneuropathy, unspecified: Secondary | ICD-10-CM | POA: Diagnosis not present

## 2022-08-21 NOTE — Progress Notes (Signed)
Virtual Visit via Video Note The purpose of this virtual visit is to provide medical care while limiting exposure to the novel coronavirus.    Consent was obtained for video visit:  Yes.   Answered questions that patient had about telehealth interaction:  Yes.    Pt location: Home Physician Location: office Name of referring provider:  [name], MD I connected with [name] at patients initiation/request on 08/21/2022 at  8:30 AM EST by video enabled telemedicine application and verified that I am speaking with the correct person using two identifiers. Pt MRN:  [id number] Pt DOB:  09-Jul-1956 Video Participants:  [name];  [name] (daughter)   History of Present Illness:  The patient had a virtual video visit on 08/21/2022. 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 She presents for an earlier visit after recent hospitalization. Her daughter [name] is present to provide additional information. Since her last visit, she had fractured her left wrist after a fall, she was doing laundry, stood up and head was spinning, then fell. They both report she did not have a seizure. She was brought to the hospital on 07/28/22 due to an increase in seizures in the setting of a fever. [name] reports her temperature went up to 103. She had more seizures than typical and was lethargic. Infectious workup was done and she was diagnosed with a liver infection and acute bronchitis. [name] reports that she had no further seizures in the hospital as her infection/fever were treated. No further seizures in almost a month. On her last visit, Cenobamate was increased to '150mg'$  daily however she did not like how it made her feel (felt weird) and reduced back to '100mg'$  daily. She continues on Dilantin '100mg'$  in AM, '130mg'$  in PM and Lacosamide '200mg'$  BID. She is on Gabapentin '300mg'$  qhs for neuropathy. She is scheduled for wrist surgery on Monday.   In the hospital, she had a  head CT without contrast showing right occipital craniectomy changes, encephalomalacia of right occipital lobe and ex vacuo dilatation of the occipital horn. 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 She had been seeing epileptologist [name], records were reviewed and will be summarized as follows. In the past, seizures would start with headache and burning sensation of the left arm, which may be followed by left arm jerking. Over time, she has not had any prior warning symptoms. She has had seizures where she is noted to have kicking and rocking movements of the pelvis with unresponsiveness. She was in the EMU in 10/2012 where 11 seizures were captured from the right posterior hemisphere/right inferior temporal region. Interictal EEG showed abundant right posterior temporal epileptiform discharges, frequent right anterior temporal epileptiform discharges, as well as occasional independent left anterior temporal epileptiform discharges. There was focal slowing over the right posterior hemisphere. MRI brain in 2014 showed right parietal/occipital encephalomalacia, right hippocampus smaller than left. PET scan in 2015 showed multiple areas of hypometabolism on the right, mostly corresponding to or adjacent to the structural abnormality. Ictal SPECT showed right hemisphere hypoperfusion. SISCOM showed right frontal region significant on the subtraction. MEG in 07/2013 showed spikes localizing to the right occipital, parietal, mesial temporal regions with a weak  signal left mesial parietal. Concomitant EEG showed right posterior spikes. Functional mapping suggestive of left language lateralization. She has tried multiple AEDs in  the past with various side effects or ineffective, and has been taking Dilantin '160mg'$  in AM, '200mg'$  in PM and Vimpat '200mg'$  BID with seizures around once a month. She was admitted to [facility] on 03/19/18 for left leg weakness and drowsiness. She was fishing with her boyfriend when she suddenly could not move her left leg and almost fell. She states that her boyfriend told her that she was shaking and had a seizure while they were fishing. She was brought to [place] where Dilantin level was 33.8. She was adamant that she has been taking her medications as instructed and did not take more than prescribed, no new medications taken. She had an MRI brain without contrast which I personally reviewed, no acute changes seen. Large volume right parieto-occipital encephalomalacia was unchanged. Right hippocampus is smaller. She continued to report weakness and had an MRI cervical and lumbar spine which did not show any myelopathy, there was prominent left-sided facet hypertrophy at C4-5 with resultant moderate left C5 foraminal stenosis, degenerative disc osteophyte at C5-6 with severe right C6 foraminal stenosis, and mild spinal stenosis at C5-6 and C6-7, chronic borderline to mild multifactorial spinal stenosis at L2-3 through L4-5 with no convincing lateral recess or foraminal stenosis. She continued to report burning pain in her legs and was started on low dose gabapentin '300mg'$  qhs with no side effects. Dilantin was held in the hospital, she was discharged on a lower dose, but states that she has been taking her pre-hospital dose of '160mg'$  in AM, '200mg'$  in PM but has had 4 seizures since hospital discharge 2 weeks ago, which is unusual. She had 2 seizures in one day in the car with her mother-in-law, her mother-in-law had to pull over. The next day she had 2 at home, her daughter found her in the bathroom. She has not been sleeping well the past few days. She has a headache after the seizures. Aside from the convulsions, she has  episodes where a feeling comes over her left side, from the arm down her leg, then she is weaker after. She denies any jerking. She is quite anxious in the office today and perseverates on her symptoms. She states she "stays nervous." She reports taking prn Ativan in the past, but discontinued at [facility]. She repeatedly states her feet are numb and they "feel like they are grabbed." She feels gabapentin has helped some with this.    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 Otherwise she had a normal birth and early development.  There is no history of febrile convulsions, CNS infections such as meningitis/encephalitis, or family history of seizures.  Diagnostic Data:  MRI brain without contrast 05/2018 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  EEG done 06/2018 showed right posterior temporal slowing, occasional epileptiform discharges over the right posterior temporal region, breach artifact in this region.   MRI cervical, thoracic, and lumbar spine done  07/2018 showed no convincing cervical or thoracic spinal cord abnormality. Mild degenerative cervical spinal stenosis at C5-6 and C6-7 with moderate or severe bilateral foraminal stenosis; no thoracic spinal or foraminal stenosis; stable borderline to mild lumbar spinal stenosis  MRI  lumbar spine in 2019 showed borderline to mild multifactorial spinal stenosis at L2-L3 and L3-L4 related to circumferential disc bulge and posterior element Hypertrophy.  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     Current Outpatient Medications on File Prior to Visit  Medication Sig Dispense Refill   acetaminophen (TYLENOL) 500 MG tablet Take 1,000 mg by mouth every 6 (six) hours as needed for headache.     Cenobamate (XCOPRI) 100 MG TABS Take 100 mg by mouth at bedtime.     DILANTIN 100 MG ER capsule TAKE 1 CAPSULE BY MOUTH TWICE DAILY 180 capsule 3   DILANTIN 30 MG ER capsule TAKE 1 CAPSULE BY MOUTH NIGHTLY ALONG WITH 100 MG CAPSULE FOR A TOTAL NIGHTLY DOSE OF '130MG'$  90 capsule 3   gabapentin (NEURONTIN) 300 MG capsule Take 2 capsule every night (Patient taking differently: Take 300 mg by mouth at bedtime.) 180 capsule 3   LORazepam (ATIVAN) 1 MG tablet TAKE 1 TABLET BY MOUTH AS DIRECTED AS NEEDED FOR 3 OR MORE SEIZURES IN 24 HOURS. DO NOT TAKE MORE THAN 2 TABLETS IN 24 HOURS. 10 tablet 5   VIMPAT 200 MG TABS tablet Take 1 tablet (200 mg total) by mouth 2 (two) times daily. 60 tablet 5   oxybutynin (DITROPAN-XL) 5 MG 24 hr tablet Take 1 tablet (5 mg total) by mouth at bedtime. 30 tablet 1   No current facility-administered medications on file prior to visit.     Observations/Objective:   Vitals:   08/21/22 0806  Weight: 140 lb (63.5 kg)  Height: '5\' 7"'$  (1.702 m)   The patient appears stated age and is in NAD. Left wrist in brace. Torticollis with head turned to the right and side to side head tremor. Patient is awake, alert. No aphasia or dysarthria. Intact fluency and comprehension. Cranial nerves: Extraocular movements intact . No facial asymmetry. Motor: moves all extremities symmetrically, at least anti-gravity x 4.    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 She has tried multiple AEDs in the past with various side effects. She has had several admissions for Dilantin toxicity and cannot tolerate higher doses of Dilantin. There is no need to increase dose even if she has subtherapeutic levels in the future, adjustments may be made if level is supratherapeutic and she is symptomatic, otherwise continue Dilantin '100mg'$  in AM, '130mg'$  in PM, Lacosamide '200mg'$  BID, and Cenobamate '100mg'$  qhs. She had an increase in seizures in the setting of infection, none in almost a month. Continue current regimen, she has prn lorazepam for seizure rescue. She is on Gabapentin '300mg'$  qhs for neuropathy. She does not drive. She is scheduled for wrist surgery on Monday, from the standpoint of seizures, there is no contraindication to medically necessary surgery. She should take all seizure medications on time, even if NPO for surgery. Follow-up as scheduled in May 2024, call for any changes.    Follow Up Instructions:    -I discussed the assessment and treatment plan with the  patient. The patient was provided an opportunity to ask questions and all were answered. The patient agreed with the plan and demonstrated an understanding of the instructions.   The patient was advised to call back or seek an in-person evaluation if the symptoms worsen or if the condition fails to improve as anticipated.     [name], MD

## 2022-08-22 ENCOUNTER — Other Ambulatory Visit: Payer: Self-pay

## 2022-08-22 ENCOUNTER — Other Ambulatory Visit: Payer: Self-pay | Admitting: Neurology

## 2022-08-22 ENCOUNTER — Telehealth: Payer: Self-pay | Admitting: Neurology

## 2022-08-22 ENCOUNTER — Encounter (HOSPITAL_COMMUNITY): Payer: Self-pay | Admitting: Orthopedic Surgery

## 2022-08-22 MED ORDER — XCOPRI 100 MG PO TABS: 100.0000 mg | ORAL_TABLET | Freq: Every evening | ORAL | 3 refills | Status: DC

## 2022-08-22 NOTE — Telephone Encounter (Signed)
Rx sent for '100mg'$  xcopri

## 2022-08-22 NOTE — Anesthesia Preprocedure Evaluation (Addendum)
Anesthesia Evaluation  Patient identified by MRN, date of birth, ID band Patient awake    Reviewed: Allergy & Precautions, NPO status , Patient's Chart, lab work & pertinent test results  Airway Mallampati: II  TM Distance: >3 FB Neck ROM: Full    Dental no notable dental hx. (+) Teeth Intact, Dental Advisory Given   Pulmonary asthma , COPD, former smoker   Pulmonary exam normal breath sounds clear to auscultation       Cardiovascular + DVT  Normal cardiovascular exam Rhythm:Regular Rate:Normal  07/2020 Echo 1. Left ventricular ejection fraction, by estimation, is 55 to 60%. The  left ventricle has normal function. The left ventricle has no regional  wall motion abnormalities. Left ventricular diastolic parameters are  indeterminate.   2. Right ventricular systolic function is normal. The right ventricular  size is normal. There is mildly elevated pulmonary artery systolic  pressure. The estimated right ventricular systolic pressure is 25.3 mmHg.   3. The mitral valve is normal in structure. No evidence of mitral valve  regurgitation. No evidence of mitral stenosis.   4. The aortic valve is tricuspid. Aortic valve regurgitation is not  visualized. No aortic stenosis is present.   5. The inferior vena cava is dilated in size with <50% respiratory  variability, suggesting right atrial pressure of 15 mmHg.     Neuro/Psych Seizures -,  Hx of cerebral hemmorhage CVA    GI/Hepatic ,GERD  ,,(+) Hepatitis -  Endo/Other    Renal/GU Lab Results      Component                Value               Date                      CREATININE               0.67                08/06/2022                BUN                      10                  08/06/2022                NA                       137                 08/06/2022                K                        4.1                 08/06/2022                CL                       104                  08/06/2022                CO2  26                  08/06/2022                    Musculoskeletal  (+) Arthritis ,    Abdominal   Peds  Hematology Lab Results      Component                Value               Date                      WBC                      3.3 (L)             08/06/2022                HGB                      10.7 (L)            08/06/2022                HCT                      34.6 (L)            08/06/2022                MCV                      107.1 (H)           08/06/2022                PLT                      270                 08/06/2022              Anesthesia Other Findings All: Chocolate codeine, Keppra, Lyrica  Reproductive/Obstetrics                             Anesthesia Physical Anesthesia Plan  ASA: 3  Anesthesia Plan: Regional and MAC   Post-op Pain Management: Regional block* and Minimal or no pain anticipated   Induction:   PONV Risk Score and Plan: 3 and Propofol infusion, Treatment may vary due to age or medical condition and Ondansetron  Airway Management Planned: Nasal Cannula and Natural Airway  Additional Equipment: None  Intra-op Plan:   Post-operative Plan:   Informed Consent: I have reviewed the patients History and Physical, chart, labs and discussed the procedure including the risks, benefits and alternatives for the proposed anesthesia with the patient or authorized representative who has indicated his/her understanding and acceptance.     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  She was recently admitted 12/25 through 08/06/2022 for increased seizure frequency with cough, and fever.  Temp on presentation to ED was 103.1, O2 sats 100% on room air, remainder of vitals were unremarkable.  CT head was negative for any acute abnormalities.  COVID-19, influenza, RSV, and respiratory viral panel all negative.  Blood cultures negative.  CT abdomen and pelvis 07/31/2022 showed evidence of minuscule loculations of gas density along the periphery of the left hepatic lobe, uncertain localization as to whether this is biliary, portal venous or other negative for bowel pneumatosis or other findings of portal venous gas to indicate ischemia. Airway thickening in both lower lobes with airway plugging. Trace left pleural effusion. Stable 4 mm nodule in the posterior basal segment of the left lower lobe. Stability from 03/24/2018 indicates benign lesion warranting no further workup.  Her symptoms improved after starting oral Augmentin. She was maintained on home antiepileptic regimen and improved clinically.  Seizures felt likely provoked by acute infection. She was discharged on 12 more days of oral Augmentin.  Recommended to have outpatient follow-up with PCP for repeat CT scan to ensure resolution of lung and liver findings on imaging.  Patient followed up with her neurologist Dr. Delice [name] on 08/21/2022.  Per note, "She had an increase in seizures in the setting of infection, none in almost a month. Continue current regimen, she has prn lorazepam for seizure rescue. She is on Gabapentin 370m qhs for neuropathy. She does not drive. She is scheduled for wrist surgery on Monday, from the standpoint of seizures, there is no contraindication to medically necessary surgery.Sheshould take all seizure medications on time, even if NPO for surgery. Follow-up as scheduled in May 2024, call for any changes."  Patient has not yet had follow-up with PCP regarding recent abnormal imaging.  Daughter reported  she had called to get an appointment with [name]  I did advise that Dr. JJani Gravelno longer works at that [name].  I reached out to GMorrisdaleto see if the patient has seen another provider in that practice and they advised patient had not been seen there since 2019.  I encouraged the daughter to follow-up and establish with a new PCP as soon as possible.  Daughter did state the patient was not having any respiratory symptoms, no longer using albuterol inhaler, was essentially back to baseline.  Patient suffered a fall 07/12/2022 resulting in a left radial fracture/acute nondisplaced ulnar styloid fracture.  She was seen by Dr. OApolonio Schneiderswith this with plans to treat conservatively.  However, she has developed a nonunion Dr. ODallie Pileshas recommended surgical intervention.  I reviewed patient's history with anesthesiologist Dr. FRoyce Macadamia  He advised the patient can proceed as planned barring any acute status change.  She will need day of surgery labs and evaluation.  EKG 07/31/2022: NSR.  Rate 80.  TTE 08/03/2020: 1. Left ventricular ejection fraction, by estimation, is 55 to 60%. The  left ventricle has normal function. The left ventricle has no regional  wall motion abnormalities. Left ventricular diastolic parameters are  indeterminate.  2. Right ventricular systolic function is normal. The right ventricular  size is normal. There is mildly elevated pulmonary artery systolic  pressure. The estimated right ventricular systolic pressure is 363.3mmHg.  3. The mitral valve is normal in structure. No evidence of mitral valve  regurgitation. No evidence of mitral stenosis.  4. The aortic valve is tricuspid. Aortic valve regurgitation is not  visualized. No aortic stenosis is present.  5. The inferior vena cava is dilated in size with <50% respiratory  variability, suggesting right atrial pressure of 15 mmHg.   CT abdomen pelvis 07/23/2022: IMPRESSION: 1.  Miniscule loculations of gas density along the periphery of the left hepatic lobe, uncertain localization as to whether this is biliary, portal venous, or other. I do not see any bowel pneumatosis or other findings of portal venous gas to indicate ischemia, but correlation with lactate level would be suggested. 2. Airway thickening in both lower lobes with airway plugging. 3. Trace left pleural effusion, increased from previous. 4. Stable 4 mm nodule in the posterior basal segment left lower lobe. Stability from 03/24/2018 indicates that this is a benign lesion warranting no further workup. 5. Mild lumbar spondylosis and degenerative disc disease. 6. Small umbilical hernia contains adipose tissue. 7. Aortic atherosclerosis.    )        Anesthesia Quick Evaluation

## 2022-08-22 NOTE — Progress Notes (Signed)
Anesthesia Chart Review: Same day workup  67 year old female with pertinent history including intractable focal to bilateral tonic-clonic seizures due to right parietal/occipital AVM s/p rupture and hemorrhage in 1988, s/p craniectomy  chronic thrombocytopenia, COPD, paroxysmal atrial fibrillation (not on anticoagulation due to history of ICH, has IVC filter in place).  She was recently admitted 12/25 through 08/06/2022 for increased seizure frequency with cough, and fever.  Temp on presentation to ED was 103.1, O2 sats 100% on room air, remainder of vitals were unremarkable.  CT head was negative for any acute abnormalities.  COVID-19, influenza, RSV, and respiratory viral panel all negative.  Blood cultures negative.  CT abdomen and pelvis 07/31/2022 showed evidence of minuscule loculations of gas density along the periphery of the left hepatic lobe, uncertain localization as to whether this is biliary, portal venous or other negative for bowel pneumatosis or other findings of portal venous gas to indicate ischemia. Airway thickening in both lower lobes with airway plugging. Trace left pleural effusion. Stable 4 mm nodule in the posterior basal segment of the left lower lobe. Stability from 03/24/2018 indicates benign lesion warranting no further workup.  Her symptoms improved after starting oral Augmentin. She was maintained on home antiepileptic regimen and improved clinically.  Seizures felt likely provoked by acute infection. She was discharged on 12 more days of oral Augmentin.  Recommended to have outpatient follow-up with PCP for repeat CT scan to ensure resolution of lung and liver findings on imaging.  Patient followed up with her neurologist [name] on 08/21/2022.  Per note, "She had an increase in seizures in the setting of infection, none in almost a month. Continue current regimen, she has prn lorazepam for seizure rescue. She is on Gabapentin '300mg'$  qhs for neuropathy. She does not drive. She is  scheduled for wrist surgery on Monday, from the standpoint of seizures, there is no contraindication to medically necessary surgery. She should take all seizure medications on time, even if NPO for surgery. Follow-up as scheduled in May 2024, call for any changes."  Patient has not yet had follow-up with PCP regarding recent abnormal imaging.  Daughter reported she had called to get an appointment with Dr. [name] at [facility].  I did advise that Dr. [name] no longer works at that office.  I reached out to [facility] to see if the patient has seen another provider in that practice and they advised patient had not been seen there since 2019.  I encouraged the daughter to follow-up and establish with a new PCP as soon as possible.  Daughter did state the patient was not having any respiratory symptoms, no longer using albuterol inhaler, was essentially back to baseline.  Patient suffered a fall 07/12/2022 resulting in a left radial fracture/acute nondisplaced ulnar styloid fracture.  She was seen by Dr. Apolonio Schneiders with this with plans to treat conservatively.  However, she has developed a nonunion Dr. Dallie Piles has recommended surgical intervention.  I reviewed patient's history with anesthesiologist Dr. Royce Macadamia.  He advised the patient can proceed as planned barring any acute status change.  She will need day of surgery labs and evaluation.  EKG 07/31/2022: NSR.  Rate 80.  TTE 08/03/2020:  1. Left ventricular ejection fraction, by estimation, is 55 to 60%. The  left ventricle has normal function. The left ventricle has no regional  wall motion abnormalities. Left ventricular diastolic parameters are  indeterminate.   2. Right ventricular systolic function is normal. The right ventricular  size is  normal. There is mildly elevated pulmonary artery systolic  pressure. The estimated right ventricular systolic pressure is 19.7 mmHg.   3. The mitral valve is normal in structure. No evidence of  mitral valve  regurgitation. No evidence of mitral stenosis.   4. The aortic valve is tricuspid. Aortic valve regurgitation is not  visualized. No aortic stenosis is present.   5. The inferior vena cava is dilated in size with <50% respiratory  variability, suggesting right atrial pressure of 15 mmHg.   CT abdomen pelvis 07/23/2022: IMPRESSION: 1. Miniscule loculations of gas density along the periphery of the left hepatic lobe, uncertain localization as to whether this is biliary, portal venous, or other. I do not see any bowel pneumatosis or other findings of portal venous gas to indicate ischemia, but correlation with lactate level would be suggested. 2. Airway thickening in both lower lobes with airway plugging. 3. Trace left pleural effusion, increased from previous. 4. Stable 4 mm nodule in the posterior basal segment left lower lobe. Stability from 03/24/2018 indicates that this is a benign lesion warranting no further workup. 5. Mild lumbar spondylosis and degenerative disc disease. 6. Small umbilical hernia contains adipose tissue. 7. Aortic atherosclerosis.    Wynonia Musty [facility] Stay [facility]/Anesthesiology Phone [phone] 08/22/2022 3:15 PM

## 2022-08-22 NOTE — Telephone Encounter (Signed)
Pt called in and left a message. She said our office needs to contact [facility] and let them know the Xcopri medication is 100 mg not 150 mg.

## 2022-08-22 NOTE — Progress Notes (Addendum)
Spoke with daughter Tira Lafferty [phone]) for PAT information and instructions for DOS.  PCP - Dr [name] Cardiologist - Dr Debara Pickett  Neurology - Dr Ellouise Newer  Chest x-ray - 07/31/22 (1V) EKG - 07/31/22 Stress Test - many years ago per patient ECHO - 08/03/20 Cardiac Cath - n/a  ICD Pacemaker/Loop - n/a  Sleep Study -  n/a CPAP - none  Diet: NPO  Anesthesia review: Yes  STOP now taking any Aspirin (unless otherwise instructed by your surgeon), Aleve, Naproxen, Ibuprofen, Motrin, Advil, Goody's, BC's, all herbal medications, fish oil, and all vitamins.   Coronavirus Screening Does the patient have any of the following symptoms:  Cough yes/no: No Fever (>100.43F)  yes/no: No Runny nose yes/no: No Sore throat yes/no: No Difficulty breathing/shortness of breath  yes/no: No  Has the patient traveled in the last 14 days and where? yes/no: No  Daughter [name] verbalized understanding of instructions that were given via phone.

## 2022-08-25 ENCOUNTER — Other Ambulatory Visit: Payer: Self-pay

## 2022-08-25 ENCOUNTER — Encounter (HOSPITAL_COMMUNITY): Payer: Self-pay | Admitting: Orthopedic Surgery

## 2022-08-25 ENCOUNTER — Encounter (HOSPITAL_COMMUNITY)
Admission: RE | Disposition: A | Discharge status: Home / Self Care | Payer: Self-pay | Source: Home / Self Care | Attending: Orthopedic Surgery

## 2022-08-25 ENCOUNTER — Ambulatory Visit (HOSPITAL_BASED_OUTPATIENT_CLINIC_OR_DEPARTMENT_OTHER): Payer: Medicare Other | Admitting: Physician Assistant

## 2022-08-25 ENCOUNTER — Ambulatory Visit (HOSPITAL_COMMUNITY): Payer: Medicare Other | Admitting: Physician Assistant

## 2022-08-25 ENCOUNTER — Other Ambulatory Visit: Payer: Self-pay | Admitting: Neurology

## 2022-08-25 ENCOUNTER — Ambulatory Visit (HOSPITAL_COMMUNITY)
Admission: RE | Admit: 2022-08-25 | Discharge: 2022-08-25 | Disposition: A | Discharge status: Home / Self Care | Payer: Medicare Other | Attending: Orthopedic Surgery | Admitting: Orthopedic Surgery

## 2022-08-25 ENCOUNTER — Ambulatory Visit (HOSPITAL_COMMUNITY): Payer: Medicare Other

## 2022-08-25 DIAGNOSIS — J4489 Other specified chronic obstructive pulmonary disease: Secondary | ICD-10-CM | POA: Insufficient documentation

## 2022-08-25 DIAGNOSIS — K759 Inflammatory liver disease, unspecified: Secondary | ICD-10-CM | POA: Insufficient documentation

## 2022-08-25 DIAGNOSIS — Z8673 Personal history of transient ischemic attack (TIA), and cerebral infarction without residual deficits: Secondary | ICD-10-CM | POA: Insufficient documentation

## 2022-08-25 DIAGNOSIS — R569 Unspecified convulsions: Secondary | ICD-10-CM | POA: Insufficient documentation

## 2022-08-25 DIAGNOSIS — J449 Chronic obstructive pulmonary disease, unspecified: Secondary | ICD-10-CM | POA: Diagnosis not present

## 2022-08-25 DIAGNOSIS — Z86718 Personal history of other venous thrombosis and embolism: Secondary | ICD-10-CM | POA: Insufficient documentation

## 2022-08-25 DIAGNOSIS — S52502P Unspecified fracture of the lower end of left radius, subsequent encounter for closed fracture with malunion: Secondary | ICD-10-CM | POA: Diagnosis present

## 2022-08-25 DIAGNOSIS — M1990 Unspecified osteoarthritis, unspecified site: Secondary | ICD-10-CM | POA: Diagnosis not present

## 2022-08-25 DIAGNOSIS — Z87891 Personal history of nicotine dependence: Secondary | ICD-10-CM | POA: Insufficient documentation

## 2022-08-25 DIAGNOSIS — S52515A Nondisplaced fracture of left radial styloid process, initial encounter for closed fracture: Secondary | ICD-10-CM

## 2022-08-25 DIAGNOSIS — Z01818 Encounter for other preprocedural examination: Secondary | ICD-10-CM

## 2022-08-25 DIAGNOSIS — W19XXXA Unspecified fall, initial encounter: Secondary | ICD-10-CM | POA: Insufficient documentation

## 2022-08-25 HISTORY — PX: OPEN REDUCTION INTERNAL FIXATION (ORIF) DISTAL RADIAL FRACTURE: SHX5989

## 2022-08-25 LAB — BASIC METABOLIC PANEL
Anion gap: 8 (ref 5–15)
BUN: 10 mg/dL (ref 8–23)
CO2: 26 mmol/L (ref 22–32)
Calcium: 9.2 mg/dL (ref 8.9–10.3)
Chloride: 104 mmol/L (ref 98–111)
Creatinine, Ser: 0.82 mg/dL (ref 0.44–1.00)
GFR, Estimated: >60 mL/min (ref >60)
Glucose, Bld: 96 mg/dL (ref 70–99)
Potassium: 3.5 mmol/L (ref 3.5–5.1)
Sodium: 138 mmol/L (ref 135–145)

## 2022-08-25 LAB — CBC
HCT: 41.4 % (ref 36.0–46.0)
Hemoglobin: 13.3 g/dL (ref 12.0–15.0)
MCH: 34.1 pg — ABNORMAL HIGH (ref 26.0–34.0)
MCHC: 32.1 g/dL (ref 30.0–36.0)
MCV: 106.2 fL — ABNORMAL HIGH (ref 80.0–100.0)
Platelets: 119 10*3/uL — ABNORMAL LOW (ref 150–400)
RBC: 3.9 MIL/uL (ref 3.87–5.11)
RDW: 13.1 % (ref 11.5–15.5)
WBC: 4 10*3/uL (ref 4.0–10.5)
nRBC: 0 % (ref 0.0–0.2)

## 2022-08-25 SURGERY — OPEN REDUCTION INTERNAL FIXATION (ORIF) DISTAL RADIUS FRACTURE
Anesthesia: Monitor Anesthesia Care | Laterality: Left

## 2022-08-25 MED ORDER — BUPIVACAINE HCL (PF) 0.25 % IJ SOLN
INTRAMUSCULAR | Status: AC
  Filled 2022-08-25: qty 30

## 2022-08-25 MED ORDER — XCOPRI 100 MG PO TABS: 100.0000 mg | ORAL_TABLET | Freq: Every evening | ORAL | 3 refills | Status: DC

## 2022-08-25 MED ORDER — PHENYLEPHRINE 80 MCG/ML (10ML) SYRINGE FOR IV PUSH (FOR BLOOD PRESSURE SUPPORT)
PREFILLED_SYRINGE | INTRAVENOUS | Status: AC
  Filled 2022-08-25: qty 20

## 2022-08-25 MED ORDER — EPHEDRINE SULFATE (PRESSORS) 50 MG/ML IJ SOLN
INTRAMUSCULAR | Status: DC | PRN
  Administered 2022-08-25: 5 mg via INTRAVENOUS
  Administered 2022-08-25: 10 mg via INTRAVENOUS
  Administered 2022-08-25: 5 mg via INTRAVENOUS

## 2022-08-25 MED ORDER — ORAL CARE MOUTH RINSE: 15.0000 mL | Freq: Once | OROMUCOSAL | Status: AC

## 2022-08-25 MED ORDER — CEFAZOLIN SODIUM-DEXTROSE 2-4 GM/100ML-% IV SOLN
2.0000 g | INTRAVENOUS | Status: AC
  Administered 2022-08-25: 2 g via INTRAVENOUS
  Filled 2022-08-25: qty 100

## 2022-08-25 MED ORDER — MIDAZOLAM HCL 2 MG/2ML IJ SOLN: 2.0000 mg | Freq: Once | INTRAMUSCULAR | Status: AC

## 2022-08-25 MED ORDER — MIDAZOLAM HCL 2 MG/2ML IJ SOLN
INTRAMUSCULAR | Status: AC
  Administered 2022-08-25: 2 mg via INTRAVENOUS
  Filled 2022-08-25: qty 2

## 2022-08-25 MED ORDER — BUPIVACAINE HCL (PF) 0.25 % IJ SOLN
INTRAMUSCULAR | Status: DC | PRN
  Administered 2022-08-25: 10 mL

## 2022-08-25 MED ORDER — PROPOFOL 500 MG/50ML IV EMUL
INTRAVENOUS | Status: DC | PRN
  Administered 2022-08-25: 75 ug/kg/min via INTRAVENOUS

## 2022-08-25 MED ORDER — EPHEDRINE 5 MG/ML INJ
INTRAVENOUS | Status: AC
  Filled 2022-08-25: qty 10

## 2022-08-25 MED ORDER — LACTATED RINGERS IV SOLN: INTRAVENOUS | Status: DC

## 2022-08-25 MED ORDER — FENTANYL CITRATE (PF) 100 MCG/2ML IJ SOLN
INTRAMUSCULAR | Status: AC
  Administered 2022-08-25: 50 ug
  Filled 2022-08-25: qty 2

## 2022-08-25 MED ORDER — PROPOFOL 10 MG/ML IV BOLUS
INTRAVENOUS | Status: DC | PRN
  Administered 2022-08-25: 30 mg via INTRAVENOUS
  Administered 2022-08-25: 40 mg via INTRAVENOUS

## 2022-08-25 MED ORDER — FENTANYL CITRATE (PF) 100 MCG/2ML IJ SOLN: 50.0000 ug | Freq: Once | INTRAMUSCULAR | Status: DC

## 2022-08-25 MED ORDER — CHLORHEXIDINE GLUCONATE 0.12 % MT SOLN
15.0000 mL | Freq: Once | OROMUCOSAL | Status: AC
  Administered 2022-08-25: 15 mL via OROMUCOSAL
  Filled 2022-08-25: qty 15

## 2022-08-25 MED ORDER — ROPIVACAINE HCL 5 MG/ML IJ SOLN
INTRAMUSCULAR | Status: DC | PRN
  Administered 2022-08-25: 30 mL via PERINEURAL

## 2022-08-25 MED ORDER — 0.9 % SODIUM CHLORIDE (POUR BTL) OPTIME
TOPICAL | Status: DC | PRN
  Administered 2022-08-25: 1000 mL

## 2022-08-25 MED ORDER — HYDROCODONE-ACETAMINOPHEN 5-325 MG PO TABS: 1.0000 | ORAL_TABLET | ORAL | 0 refills | Status: AC | PRN

## 2022-08-25 MED ORDER — PHENYLEPHRINE HCL (PRESSORS) 10 MG/ML IV SOLN
INTRAVENOUS | Status: DC | PRN
  Administered 2022-08-25: 80 ug via INTRAVENOUS
  Administered 2022-08-25: 40 ug via INTRAVENOUS

## 2022-08-25 SURGICAL SUPPLY — 75 items
BAG COUNTER SPONGE SURGICOUNT (BAG) ×1 IMPLANT
BIT DRILL 2.2 SS TIBIAL (BIT) IMPLANT
BLADE CLIPPER SURG (BLADE) IMPLANT
BNDG ELASTIC 3X5.8 VLCR STR LF (GAUZE/BANDAGES/DRESSINGS) ×1 IMPLANT
BNDG ELASTIC 4X5.8 VLCR STR LF (GAUZE/BANDAGES/DRESSINGS) ×1 IMPLANT
BNDG ESMARK 4X9 LF (GAUZE/BANDAGES/DRESSINGS) ×1 IMPLANT
BNDG GAUZE DERMACEA FLUFF 4 (GAUZE/BANDAGES/DRESSINGS) ×1 IMPLANT
CANISTER SUCT 3000ML PPV (MISCELLANEOUS) ×1 IMPLANT
CORD BIPOLAR FORCEPS 12FT (ELECTRODE) ×1 IMPLANT
COVER SURGICAL LIGHT HANDLE (MISCELLANEOUS) ×1 IMPLANT
CUFF TOURN SGL QUICK 18X4 (TOURNIQUET CUFF) ×1 IMPLANT
CUFF TOURN SGL QUICK 24 (TOURNIQUET CUFF)
CUFF TRNQT CYL 24X4X16.5-23 (TOURNIQUET CUFF) IMPLANT
DRAPE OEC MINIVIEW 54X84 (DRAPES) ×1 IMPLANT
DRAPE SURG 17X11 SM STRL (DRAPES) ×1 IMPLANT
DRSG ADAPTIC 3X8 NADH LF (GAUZE/BANDAGES/DRESSINGS) ×1 IMPLANT
GAUZE 4X4 16PLY ~~LOC~~+RFID DBL (SPONGE) ×1 IMPLANT
GAUZE SPONGE 4X4 12PLY STRL (GAUZE/BANDAGES/DRESSINGS) ×1 IMPLANT
GAUZE XEROFORM 5X9 LF (GAUZE/BANDAGES/DRESSINGS) ×1 IMPLANT
GLOVE BIOGEL PI IND STRL 8.5 (GLOVE) ×1 IMPLANT
GLOVE SURG ORTHO 8.0 STRL STRW (GLOVE) ×1 IMPLANT
GOWN STRL REUS W/ TWL LRG LVL3 (GOWN DISPOSABLE) ×1 IMPLANT
GOWN STRL REUS W/ TWL XL LVL3 (GOWN DISPOSABLE) ×1 IMPLANT
GOWN STRL REUS W/TWL LRG LVL3 (GOWN DISPOSABLE) ×1
GOWN STRL REUS W/TWL XL LVL3 (GOWN DISPOSABLE) ×1
K-WIRE 1.6 (WIRE) ×1
K-WIRE FX5X1.6XNS BN SS (WIRE) ×1
KIT BASIN OR (CUSTOM PROCEDURE TRAY) ×1 IMPLANT
KIT TURNOVER KIT B (KITS) ×1 IMPLANT
KWIRE FX5X1.6XNS BN SS (WIRE) IMPLANT
NDL HYPO 25X1 1.5 SAFETY (NEEDLE) ×1 IMPLANT
NEEDLE HYPO 25X1 1.5 SAFETY (NEEDLE) ×1 IMPLANT
NS IRRIG 1000ML POUR BTL (IV SOLUTION) ×1 IMPLANT
PACK ORTHO EXTREMITY (CUSTOM PROCEDURE TRAY) ×1 IMPLANT
PAD ARMBOARD 7.5X6 YLW CONV (MISCELLANEOUS) ×2 IMPLANT
PAD CAST 3X4 CTTN HI CHSV (CAST SUPPLIES) IMPLANT
PAD CAST 4YDX4 CTTN HI CHSV (CAST SUPPLIES) ×1 IMPLANT
PADDING CAST COTTON 3X4 STRL (CAST SUPPLIES) ×2
PADDING CAST COTTON 4X4 STRL (CAST SUPPLIES) ×1
PEG LOCKING SMOOTH 2.2X18 (Peg) IMPLANT
PEG LOCKING SMOOTH 2.2X20 (Screw) IMPLANT
PLATE STANDARD DVR LEFT (Plate) ×1 IMPLANT
PLATE STD DVR LT 24X51 (Plate) IMPLANT
PUTTY DBM STAGRAFT PLUS 2CC (Putty) IMPLANT
SCREW LOCK 14X2.7X 3 LD TPR (Screw) IMPLANT
SCREW LOCK 18X2.7X 3 LD TPR (Screw) IMPLANT
SCREW LOCK 22X2.7X 3 LD TPR (Screw) IMPLANT
SCREW LOCKING 2.7X13MM (Screw) IMPLANT
SCREW LOCKING 2.7X14 (Screw) ×2 IMPLANT
SCREW LOCKING 2.7X15MM (Screw) IMPLANT
SCREW LOCKING 2.7X18 (Screw) ×1 IMPLANT
SCREW LOCKING 2.7X22MM (Screw) ×1 IMPLANT
SCREW MULTI DIRECTIONAL 2.7X16 (Screw) IMPLANT
SCREW MULTI DIRECTIONAL 2.7X18 (Screw) IMPLANT
SCREW MULTI DIRECTIONAL 2.7X20 (Screw) IMPLANT
SCREW NLOCK 26X2.7X3 LD THRD (Screw) IMPLANT
SCREW NONLOCK 2.7X26MM (Screw) ×1 IMPLANT
SOAP 2 % CHG 4 OZ (WOUND CARE) ×1 IMPLANT
SPIKE FLUID TRANSFER (MISCELLANEOUS) ×1 IMPLANT
SPLINT PLASTER CAST FAST 3X15 (CAST SUPPLIES) IMPLANT
SPLINT PLASTER CAST FAST 5X30 (CAST SUPPLIES) IMPLANT
SPONGE T-LAP 4X18 ~~LOC~~+RFID (SPONGE) ×1 IMPLANT
SUT MNCRL AB 4-0 PS2 18 (SUTURE) IMPLANT
SUT PROLENE 4 0 PS 2 18 (SUTURE) IMPLANT
SUT VIC AB 2-0 CT1 27 (SUTURE)
SUT VIC AB 2-0 CT1 TAPERPNT 27 (SUTURE) IMPLANT
SUT VIC AB 2-0 SH 27 (SUTURE) ×1
SUT VIC AB 2-0 SH 27XBRD (SUTURE) IMPLANT
SUT VICRYL 4-0 PS2 18IN ABS (SUTURE) IMPLANT
SYR CONTROL 10ML LL (SYRINGE) IMPLANT
TOWEL GREEN STERILE (TOWEL DISPOSABLE) ×1 IMPLANT
TOWEL GREEN STERILE FF (TOWEL DISPOSABLE) IMPLANT
TUBE CONNECTING 12X1/4 (SUCTIONS) ×1 IMPLANT
WATER STERILE IRR 1000ML POUR (IV SOLUTION) ×1 IMPLANT
YANKAUER SUCT BULB TIP NO VENT (SUCTIONS) IMPLANT

## 2022-08-25 NOTE — Discharge Instructions (Signed)
KEEP BANDAGE CLEAN AND DRY °CALL OFFICE FOR F/U APPT [phone] in 14 days °KEEP HAND ELEVATED ABOVE HEART °OK TO APPLY ICE TO OPERATIVE AREA °CONTACT OFFICE IF ANY WORSENING PAIN OR CONCERNS. °

## 2022-08-25 NOTE — Op Note (Signed)
PREOPERATIVE DIAGNOSIS: Left distal radius nascent malunion  POSTOPERATIVE DIAGNOSIS: Same  ATTENDING SURGEON: Dr. Iran Planas who scrubbed and present for the entire procedure  ASSISTANT SURGEON: None  ANESTHESIA: Regional with IV sedation  OPERATIVE PROCEDURE: Left distal radius repair nascent nonunion with compression plating technique Left distal radius brachioradialis tendon tenotomy 3 views left wrist radiographs  IMPLANTS: DVR cross lock standard  EBL: Minimal  RADIOGRAPHIC INTERPRETATION: AP lateral oblique views of the wrist do show the volar plate fixation in place in good position  SURGICAL INDICATIONS: Patient is a right-hand-dominant female who presented with the nascent malunion to the left distal radius.  Patient elected undergo the above procedure.  Risks of surgery include but not limited to bleeding infection damage nearby nerves arteries or tendons loss of motion of the wrist and digits nonunion malunion hardware failure and need for further surgical invention.  Signed informed consent was obtained on the day of surgery.  SURGICAL TECHNIQUE: The patient was properly identified in the preoperative holding area marked apart a marker made on the left wrist indicate correct operative site.  Patient then brought back to operating placed supine on the anesthesia table where the regional anesthetic was administered.  Patient tolerated this well.  A well-padded tourniquet was then placed on the left brachium and stay with the appropriate drape.  Left upper extremities then prepped and draped normal sterile fashion.  A timeout was called the correct site was identified procedure then began attention then turned to the left wrist.  Longitude incision made directly over the volar aspect of the wrist.  Dissection carried down through the skin and subcutaneous tissue.  The FCR sheath was then opened proximally distally.  Going through the floor the FCR sheath the pronator quadratus was  then elevated in an L-shaped fashion.  Exposure of the malunion was then carried out.  Takedown of the malunion was then done with small rongeurs and osteotomes.  Recreation of the fracture site was then carried out and the proximal segment was able to be pronated to release the malunion.  In order to reduce the fragment separate intervention was done to release the brachial radialis.  Tendon tenotomy was then carried out releasing the brachial radialis.  This was a separate intervention.  Once pronation of the proximal segment was then completed open reduction was then performed.  The volar plate was then applied distally and held with a K wire.  The plate was fixed distally with the distal row fixation with a combination of locking pegs and screws.  Following this plate fixation was achieved proximally with the oblong screw hole.  Distraction was then carried out and recreation of the height tilt was recreated.  It was held in place with the appropriate screws.  Following this the metaphyseal defect was then packed with 2 cc of Biomet bone graft stay graft bone graft substitute.  After packing and the bone graft final screw fixation was carried out with a combination of locking screws and pegs.  The wound was irrigated.  Final radiographs were then obtained.  Pronator quadratus was then closed with 2-0 Vicryl.  Subcutaneous tissue was closed with Monocryl and skin closed with simple Prolene suture.  Adaptic dressing a sterile compressive bandage then applied.  The patient then placed in well-padded sugar-tong splint taken recovery room in good condition.  POSTOPERATIVE PLAN: Patient discharged to home.  See him back in the office in 10 to 14 days for wound check suture removal x-rays application of short  arm cast.  See her back at the 4-week mark for x-rays and application of the brace and begin a therapy program at the 4-week mark.  Radiographs at each visit.  Ensure to place the therapy order the first  postoperative visit.

## 2022-08-25 NOTE — Transfer of Care (Signed)
Immediate Anesthesia Transfer of Care Note  Patient: [name]  Procedure(s) Performed: OPEN REDUCTION INTERNAL FIXATION (ORIF) DISTAL RADIUS FRACTURE of nascent malunion (Left)  Patient Location: PACU  Anesthesia Type:MAC combined with regional for post-op pain  Level of Consciousness: awake, alert , and oriented  Airway & Oxygen Therapy: Patient Spontanous Breathing  Post-op Assessment: Report given to RN and Post -op Vital signs reviewed and stable  Post vital signs: Reviewed and stable  Last Vitals:  Vitals Value Taken Time  BP 86/49 08/25/22 2037  Temp 97.6   Pulse 85 08/25/22 2040  Resp 18 08/25/22 2040  SpO2 96 % 08/25/22 2040  Vitals shown include unvalidated device data.  Last Pain:  Vitals:   08/25/22 1522  PainSc: 7          Complications: No notable events documented.

## 2022-08-25 NOTE — Telephone Encounter (Signed)
Pt called in and left a message with the access nurse on 08/22/22. She needs a prescription for xopria 100 mg sent to her mail order pharmacy

## 2022-08-25 NOTE — Telephone Encounter (Signed)
Sent to dr Delice Lesch to send in

## 2022-08-25 NOTE — H&P (Signed)
[name] is an 67 y.o. female.   Chief Complaint: Left wrist injury HPI: Patient fell greater than 6 weeks ago sustained a closed injury to her left wrist.  Patient had further displacement with the development of a malunion.  Patient is here for correction of the malunion.  Risk benefits and alternatives discussed in detail with the patient in the office.  Past Medical History:  Diagnosis Date   Asthma    Cerebral hemorrhage ([place]) 1989   Chronic back pain    COPD (chronic obstructive pulmonary disease) ([facility])    DDD (degenerative disc disease) 06/12/2013   DVT (deep venous thrombosis) ([place])    "she's had several since 1990"   GERD (gastroesophageal reflux disease)    Headache    "usually around time when she's had a seizure"   History of blood transfusion    "when she was a baby"   History of kidney stones    History of stomach ulcers    Neuropathy    Seizures ([place])    "because of her brain surgery"  last one 07/28/22   Stroke [facility]) 1990's   family denies residual on 11/09/2014   Tunnel vision    "since brain OR"    Past Surgical History:  Procedure Laterality Date   [place]   craniotomy with hematoma evacuation   [place]; 1987; [place]     IVC FILTER INSERTION N/A 10/12/2020   Procedure: IVC FILTER INSERTION;  Surgeon: Angelia Mould, MD;  Location: [facility];  Service: Cardiovascular;  Laterality: N/A;   TUBAL LIGATION  1990's    Family History  Problem Relation Age of Onset   Heart attack Mother        4 MIs, started in her 31s, also vaginal cancer and colon cancer   Cancer Mother    Heart attack Father        Died suddenly age 55 - autopsy revealed heart attack her patient   Cancer Brother        Sinus cancer   Social History:  reports that she quit smoking about 4 years ago. Her smoking use included cigarettes. She has a 10.00 pack-year smoking history. She has never used smokeless tobacco. She  reports that she does not drink alcohol and does not use drugs.  Allergies:  Allergies  Allergen Reactions   Zonegran [Zonisamide] Other (See Comments)    Numbness and tingling over whole body   Chocolate Other (See Comments)    Triggers seizures   Codeine Nausea And Vomiting   Keppra [Levetiracetam] Other (See Comments)    Causes seizures   Lyrica [Pregabalin] Nausea And Vomiting   Olive Oil Rash    Medications Prior to Admission  Medication Sig Dispense Refill   acetaminophen (TYLENOL) 325 MG tablet Take 650 mg by mouth daily as needed for mild pain or moderate pain.     Cenobamate (XCOPRI) 100 MG TABS Take 1 tablet (100 mg total) by mouth at bedtime. 90 tablet 3   DILANTIN 100 MG ER capsule TAKE 1 CAPSULE BY MOUTH TWICE DAILY (Patient taking differently: Take 100 mg by mouth 2 (two) times daily.) 60 capsule 10   DILANTIN 30 MG ER capsule TAKE 1 CAPSULE BY MOUTH NIGHTLY ALONG WITH 100 MG CAPSULE FOR A TOTAL NIGHTLY DOSE OF '130MG'$  (Patient taking differently: Take 30 mg by mouth daily.) 30 capsule 10   folic acid (FOLVITE) 1 MG tablet Take 1  mg by mouth daily.     gabapentin (NEURONTIN) 300 MG capsule Take 2 capsule every night (Patient taking differently: Take 300 mg by mouth at bedtime.) 180 capsule 3   VIMPAT 200 MG TABS tablet Take 1 tablet (200 mg total) by mouth 2 (two) times daily. 60 tablet 5   LORazepam (ATIVAN) 1 MG tablet TAKE 1 TABLET BY MOUTH AS DIRECTED AS NEEDED FOR 3 OR MORE SEIZURES IN 24 HOURS. DO NOT TAKE MORE THAN 2 TABLETS IN 24 HOURS. (Patient not taking: Reported on 08/25/2022) 10 tablet 5   oxybutynin (DITROPAN-XL) 5 MG 24 hr tablet Take 1 tablet (5 mg total) by mouth at bedtime. (Patient not taking: Reported on 08/25/2022) 30 tablet 1    No results found for this or any previous visit (from the past 48 hour(s)). No results found.  ROS as noted within the medical chart.  Blood pressure 119/69, pulse 93, temperature 98 F (36.7 C), height '5\' 7"'$  (1.702 m),  weight 61.2 kg, SpO2 95 %. Physical Exam  General Appearance:  Alert, cooperative, no distress, appears stated age  Head:  Normocephalic, without obvious abnormality, atraumatic  Eyes:  Pupils equal, conjunctiva/corneas clear,         Throat: Lips, mucosa, and tongue normal; teeth and gums normal  Neck: No visible masses     Lungs:   respirations unlabored  Chest Wall:  No tenderness or deformity  Heart:  Regular rate and rhythm,  Abdomen:   Soft, non-tender,         Extremities: The patient's skin is in good condition.  She is able to gently wiggle her fingers her fingers are warm well-perfused good capillary refill good blood flow.  Pulses: 2+ and symmetric  Skin: Skin color, texture, turgor normal, no rashes or lesions     Neurologic: Normal     Assessment/Plan Left distal radius malunion  Left distal radius open repair and correction of the distal radius malunion with internal fixation  R/B/A DISCUSSED WITH PT IN OFFICE.  PT VOICED UNDERSTANDING OF PLAN CONSENT SIGNED DAY OF SURGERY PT SEEN AND EXAMINED PRIOR TO OPERATIVE PROCEDURE/DAY OF SURGERY SITE MARKED. QUESTIONS ANSWERED WILL GO HOME FOLLOWING SURGERY   WE ARE PLANNING SURGERY FOR YOUR UPPER EXTREMITY. THE RISKS AND BENEFITS OF SURGERY INCLUDE BUT NOT LIMITED TO BLEEDING INFECTION, DAMAGE TO NEARBY NERVES ARTERIES TENDONS, FAILURE OF SURGERY TO ACCOMPLISH ITS INTENDED GOALS, PERSISTENT SYMPTOMS AND NEED FOR FURTHER SURGICAL INTERVENTION. WITH THIS IN MIND WE WILL PROCEED. I HAVE DISCUSSED WITH THE PATIENT THE PRE AND POSTOPERATIVE REGIMEN AND THE DOS AND DON'TS. PT VOICED UNDERSTANDING AND INFORMED CONSENT SIGNED.   Linna Hoff 08/25/2022, 4:33 PM

## 2022-08-25 NOTE — Anesthesia Procedure Notes (Addendum)
Anesthesia Regional Block: Supraclavicular block   Pre-Anesthetic Checklist: , timeout performed,  Correct Patient, Correct Site, Correct Laterality,, site marked,  Risks and benefits discussed,  Post-op pain management  Laterality: Upper  Prep: chloraprep       Needles:   Needle Type: Echogenic Needle     Needle Length: 5cm  Needle Gauge: 21     Additional Needles:   Procedures:,,,, ultrasound used (permanent image in chart),,    Narrative:  Start time: 08/25/2022 4:42 PM End time: 08/25/2022 4:50 PM  Additional Notes: Pt tolerated procedure well.

## 2022-08-25 NOTE — Anesthesia Postprocedure Evaluation (Signed)
Anesthesia Post Note  Patient: [name]  Procedure(s) Performed: OPEN REDUCTION INTERNAL FIXATION (ORIF) DISTAL RADIUS FRACTURE of nascent malunion (Left)     Patient location during evaluation: PACU Anesthesia Type: Regional Level of consciousness: awake Pain management: pain level controlled Vital Signs Assessment: post-procedure vital signs reviewed and stable Respiratory status: spontaneous breathing, nonlabored ventilation and respiratory function stable Cardiovascular status: blood pressure returned to baseline and stable Postop Assessment: no apparent nausea or vomiting Anesthetic complications: no   No notable events documented.  Last Vitals:  Vitals:   08/25/22 2125 08/25/22 2130  BP: 103/66 106/62  Pulse: 76 75  Resp: 13 16  Temp: 36.5 C 36.5 C  SpO2: 90% 95%    Last Pain:  Vitals:   08/25/22 2130  PainSc: 0-No pain                 Macel Yearsley P Katalina Magri

## 2022-08-26 ENCOUNTER — Encounter (HOSPITAL_COMMUNITY): Payer: Self-pay | Admitting: Orthopedic Surgery

## 2022-08-26 NOTE — Telephone Encounter (Signed)
Refills sent by Dr. Berdine Addison on 08/25/22, thanks

## 2022-09-12 IMAGING — CT CT HEAD W/O CM
4 of 5 series · 14 of 47 positions shown, 16 images · non-contrast
Comparison: Head and cervical spine CT scan 04/24/2020.

CLINICAL DATA: Status post fall on stairs today. Neck pain. Initial
encounter.

EXAM:
CT HEAD WITHOUT CONTRAST
CT MAXILLOFACIAL WITHOUT CONTRAST
CT CERVICAL SPINE WITHOUT CONTRAST
TECHNIQUE: Multidetector CT imaging of the head, cervical spine, and
maxillofacial structures were performed using the standard protocol
without intravenous contrast. Multiplanar CT image reconstructions
of the cervical spine and maxillofacial structures were also
generated.

[Series 3: head wo · axial · 0.47mm/px · z∈[-199,-164]mm · 2 of 35 slices shown (1 of 2)]
[im 7/35  brain]
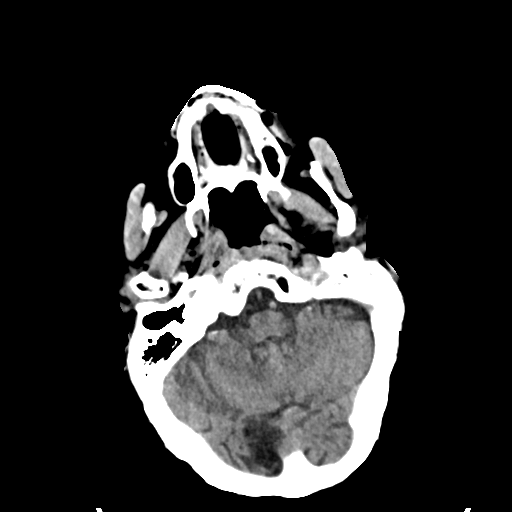
[im 14/35  brain]
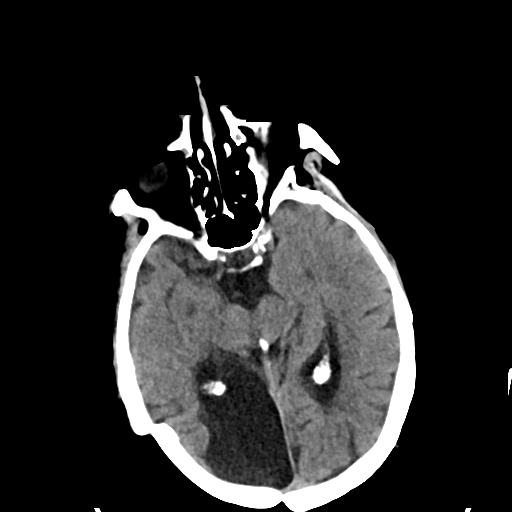

[Series 5: cor soft · coronal · 0.34mm/px · 3 of 75 slices shown]
[im 27/75  brain]
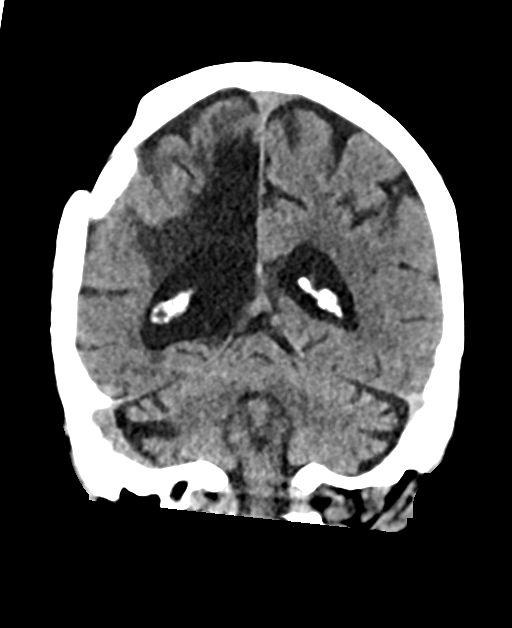
[im 34/75  brain]
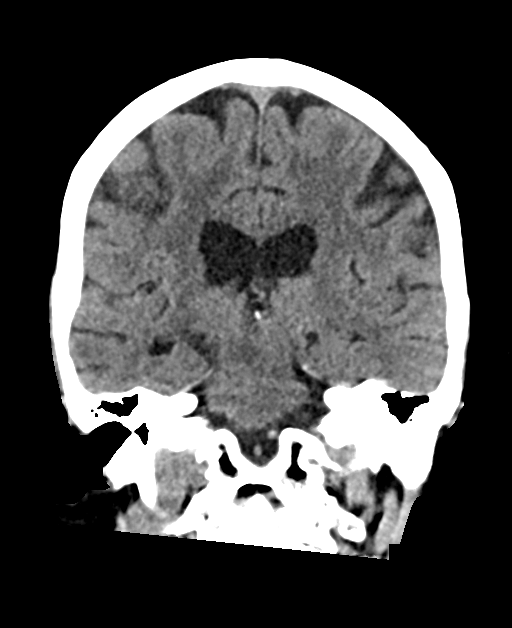
[im 41/75  brain]
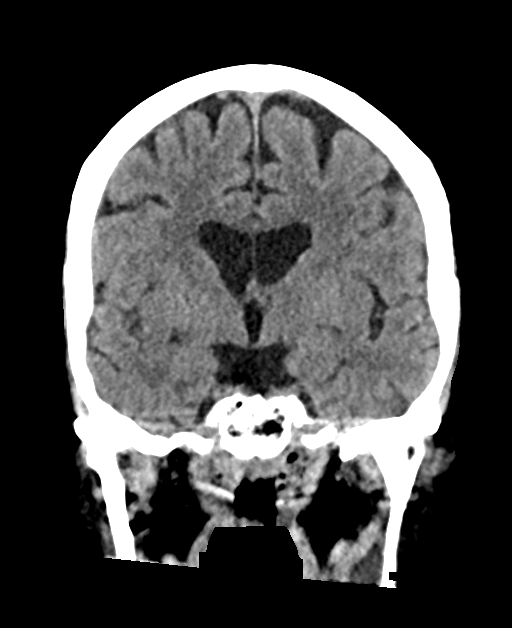

[Series 6: sag soft · sagittal · 0.41mm/px · 3 of 57 slices shown]
[im 19/57  brain]
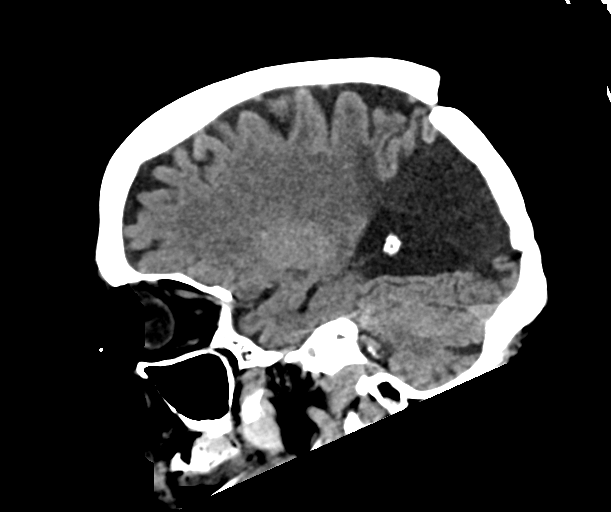
[im 29/57  brain]
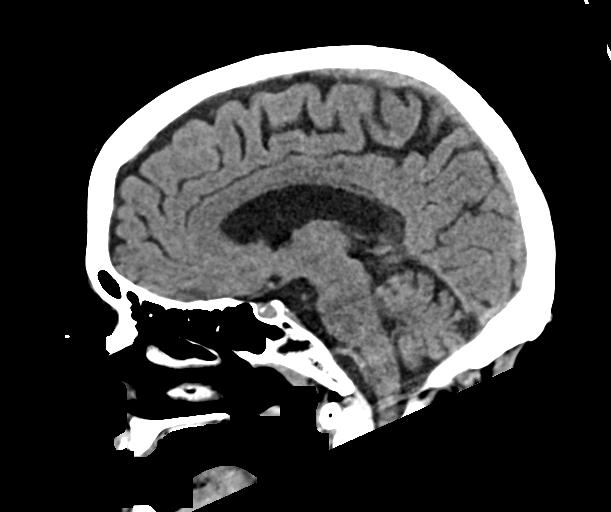
[im 38/57  brain]
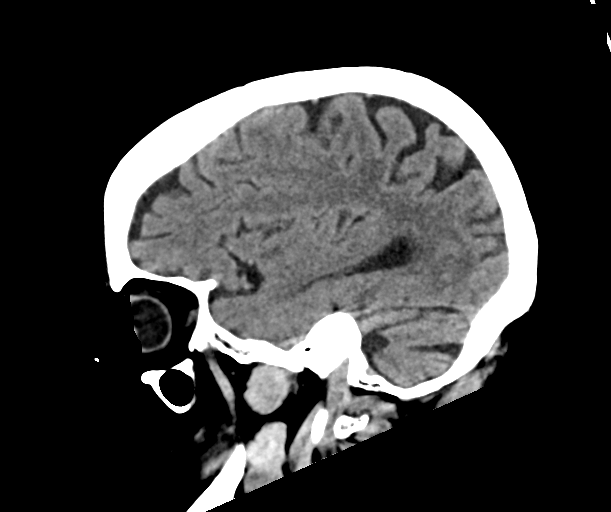

[Series 7: head wo · axial · 0.34mm/px · z∈[-199,-63]mm · 6 of 43 slices shown, 8 images (2 of 2)]
[im 7/43  brain]
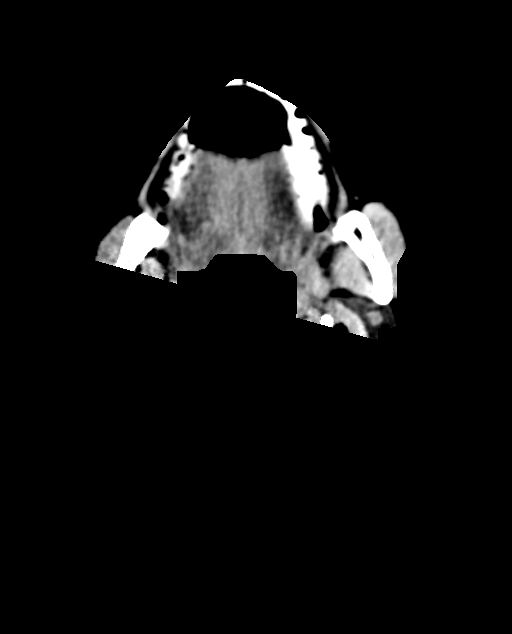
[im 7/43  bone]
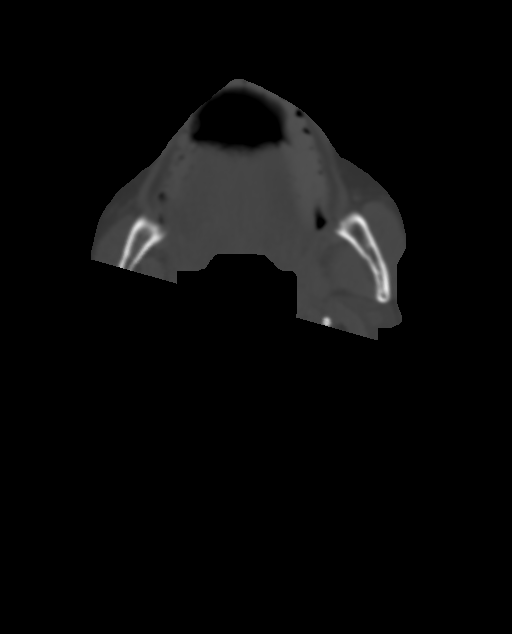
[im 13/43  brain]
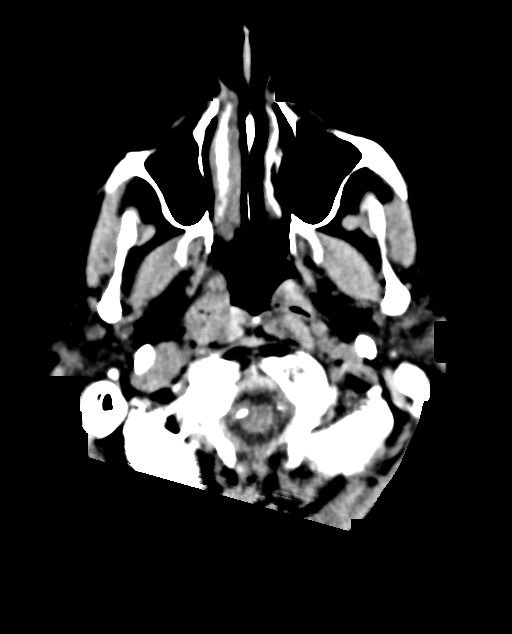
[im 19/43  brain]
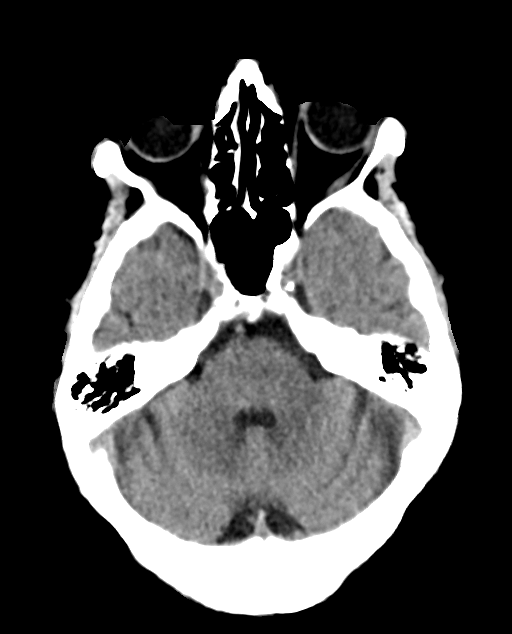
[im 25/43  brain]
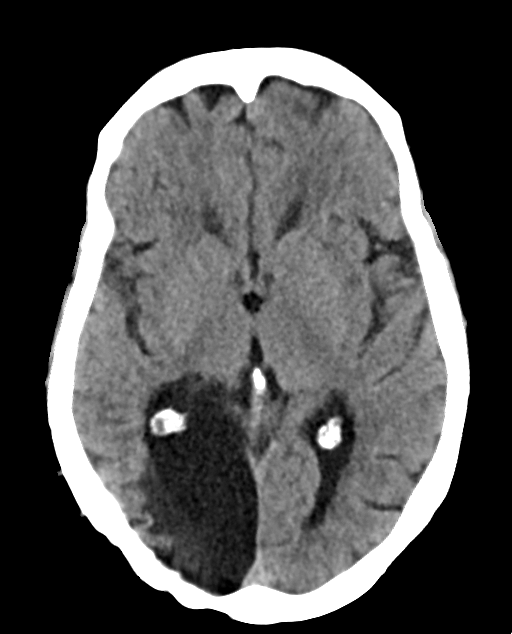
[im 31/43  brain]
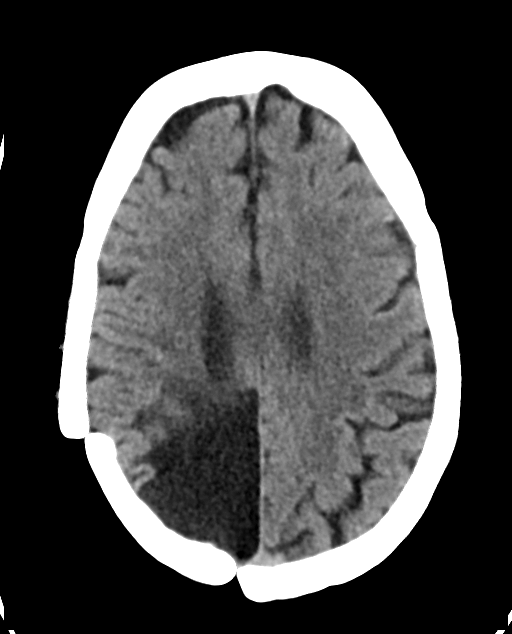
[im 31/43  bone]
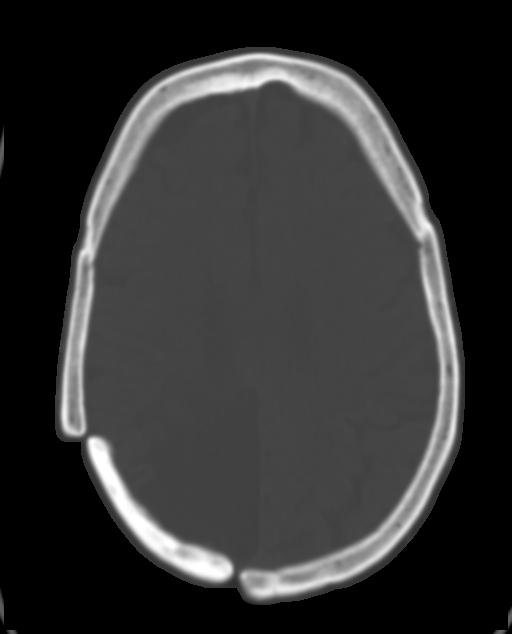
[im 37/43  brain]
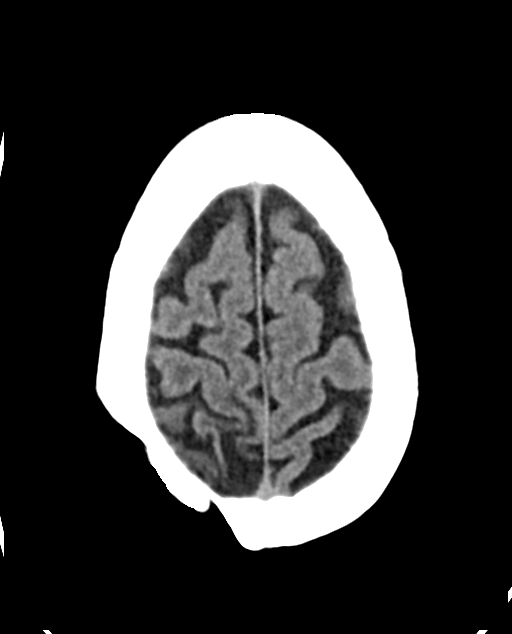

[14 of 47 positions shown; findings below may reference images not displayed]

FINDINGS: CT HEAD FINDINGS

Brain: No evidence of acute infarction, hemorrhage, hydrocephalus,
extra-axial collection or mass lesion/mass effect. Right
parieto-occipital encephalomalacia with porencephaly of the right
lateral ventricle is unchanged.

Vascular: No hyperdense vessel or unexpected calcification.

Skull: No acute abnormality.  Remote right craniotomy again seen.

Other: None.

CT MAXILLOFACIAL FINDINGS

Osseous: No fracture or mandibular dislocation. No destructive
process.

Orbits: Negative. No traumatic or inflammatory finding.

Sinuses: Clear.

Soft tissues: Negative.

CT CERVICAL SPINE FINDINGS

Alignment: Maintained.

Skull base and vertebrae: The patient has an acute fracture of the
anterior aspect of the left lateral mass C2. There is slight
anterior displacement of the fracture fragment and minimal
impaction. No other fracture is identified. No lytic or sclerotic
lesion.

Soft tissues and spinal canal: No prevertebral fluid or swelling. No
visible canal hematoma.

Disc levels: Loss of disc space height and endplate spurring most
notable at C5-6 and C6-7. Scattered facet degenerative change is
worst on the left at C4-5.

Upper chest: Lung apices are clear with centrilobular emphysema
noted.

Other: None.
IMPRESSION: Mildly impacted and anteriorly displaced fracture of the left
lateral mass of C2. No other acute abnormality head, face or
cervical spine.

Postoperative change of right craniotomy with underlying
encephalomalacia, unchanged.

Cervical degenerative disease C5-6 and C6-7.

Emphysema (ICOLV-UPY.S).

Critical Value/emergent results were called by telephone at the time
of interpretation on 08/02/2020 at [DATE] to provider PIU S
PYRRAIT , who verbally acknowledged these results.

## 2022-09-12 IMAGING — CT CT CERVICAL SPINE W/O CM
3 series · 14 of 33 positions shown, 17 images · non-contrast
Comparison: Head and cervical spine CT scan 04/24/2020.

CLINICAL DATA: Status post fall on stairs today. Neck pain. Initial
encounter.

EXAM:
CT HEAD WITHOUT CONTRAST
CT MAXILLOFACIAL WITHOUT CONTRAST
CT CERVICAL SPINE WITHOUT CONTRAST
TECHNIQUE: Multidetector CT imaging of the head, cervical spine, and
maxillofacial structures were performed using the standard protocol
without intravenous contrast. Multiplanar CT image reconstructions
of the cervical spine and maxillofacial structures were also
generated.

[Series 4: c spine soft · axial · 0.34mm/px · z∈[-363,-217]mm · 6 of 96 slices shown, 8 images]
[im 15/96  soft-tissue]
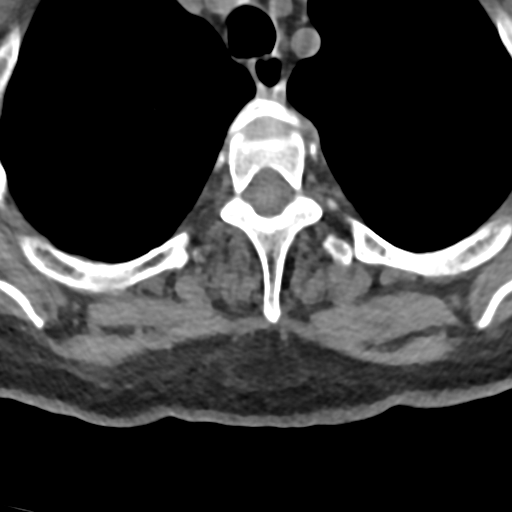
[im 15/96  bone]
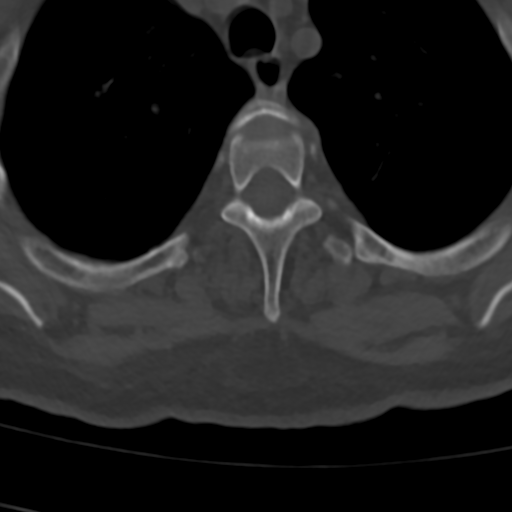
[im 30/96  bone]
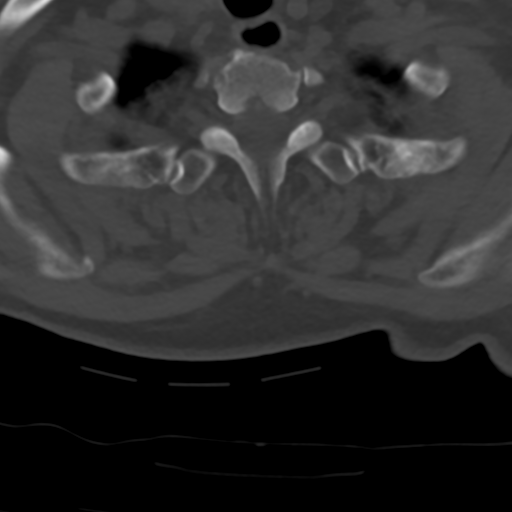
[im 44/96  bone]
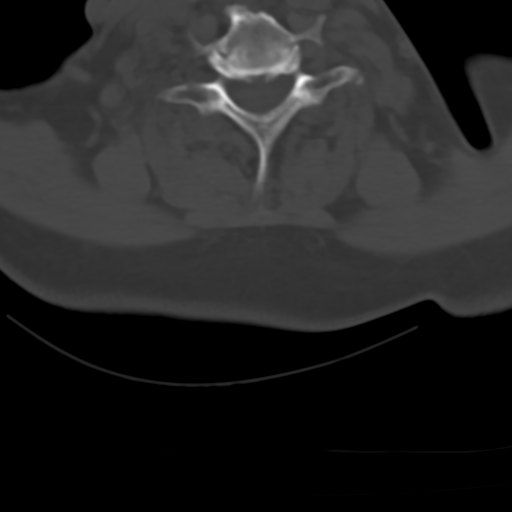
[im 59/96  bone]
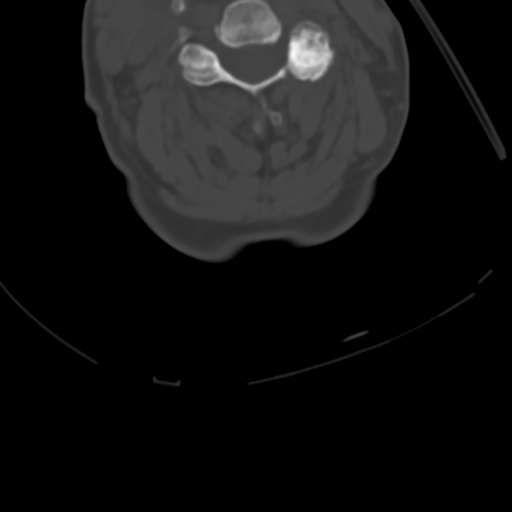
[im 74/96  soft-tissue]
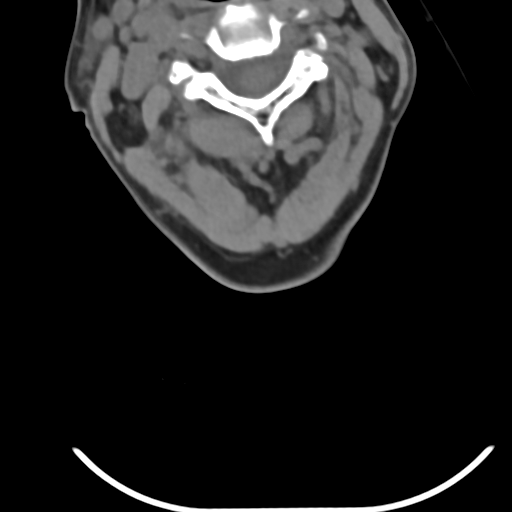
[im 74/96  bone]
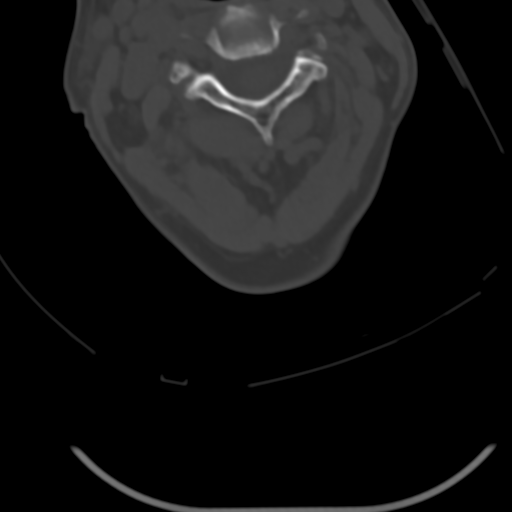
[im 88/96  bone]
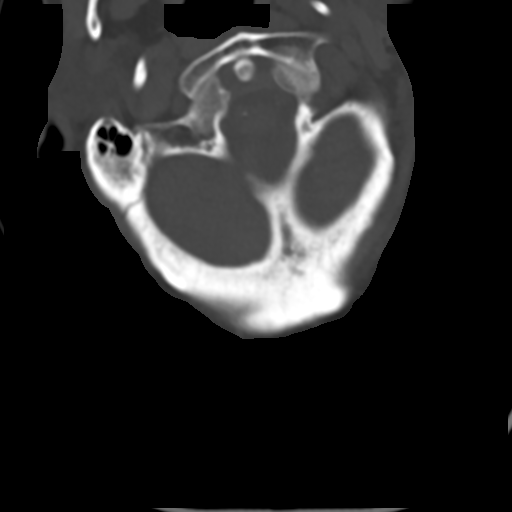

[Series 8: sag bone · sagittal · 0.40mm/px · 5 of 89 slices shown, 6 images]
[im 30/89  bone]
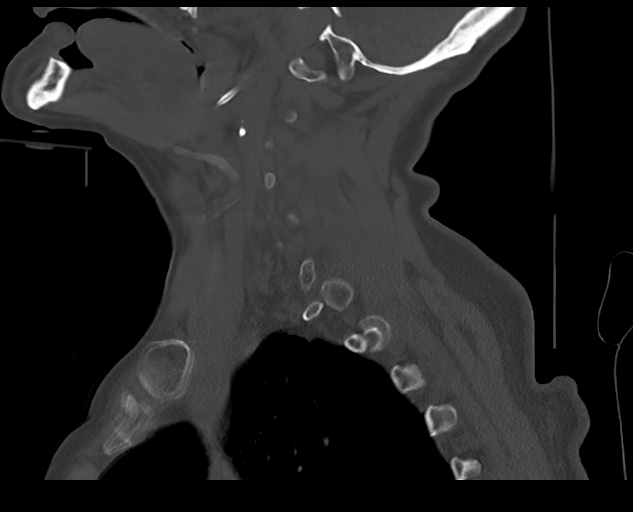
[im 37/89  bone]
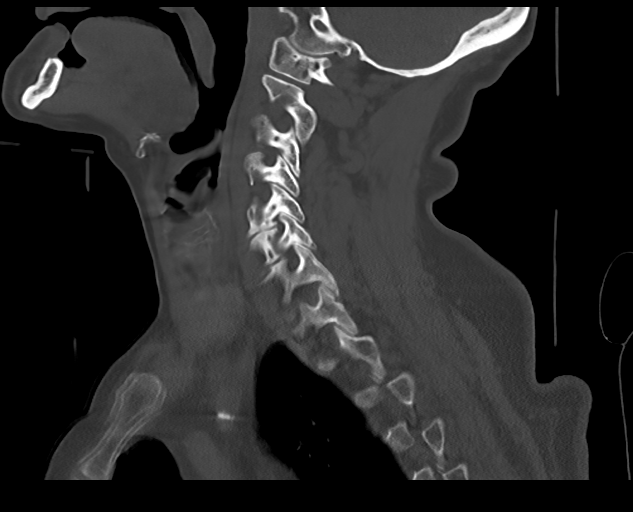
[im 45/89  soft-tissue]
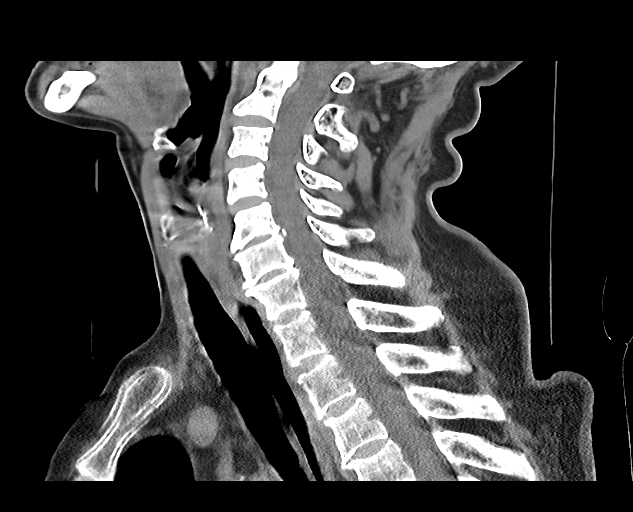
[im 45/89  bone]
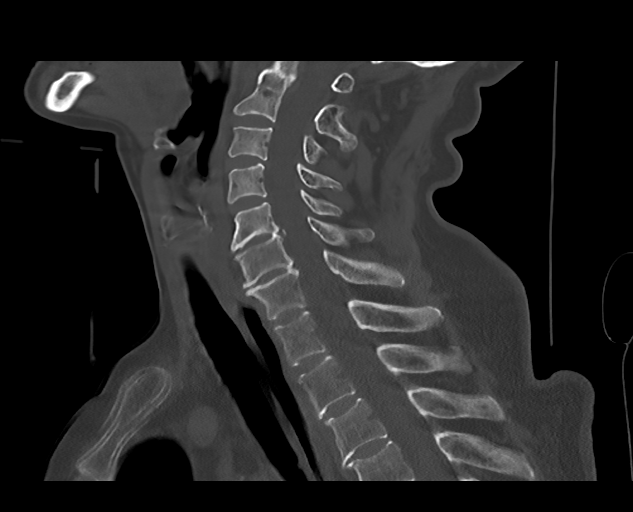
[im 52/89  bone]
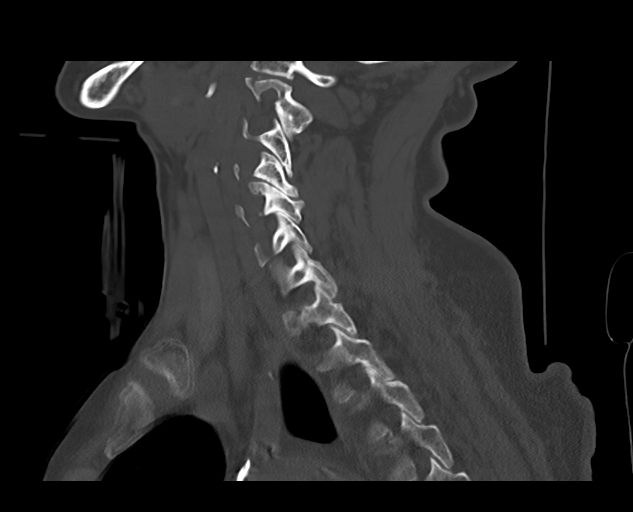
[im 59/89  bone]
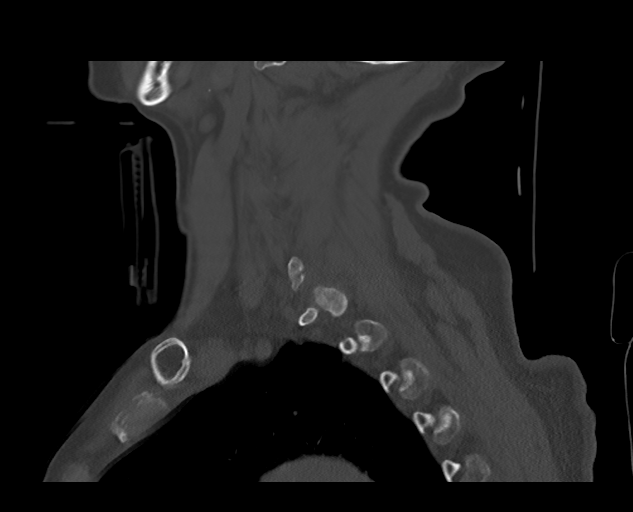

[Series 9: cor bone · coronal · 0.40mm/px · 3 of 120 slices shown]
[im 24/120  bone]
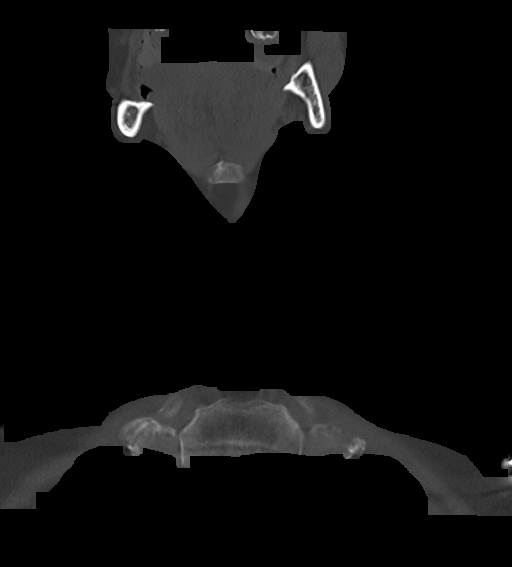
[im 48/120  bone]
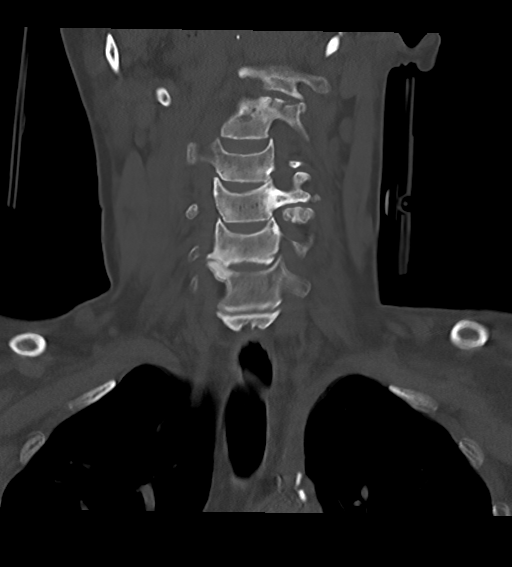
[im 72/120  bone]
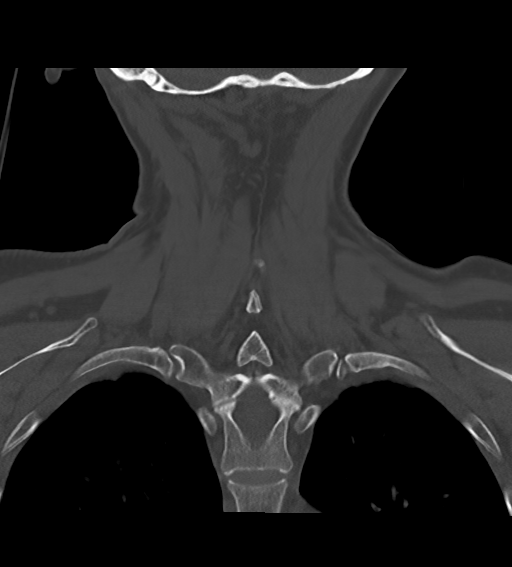

[14 of 33 positions shown; findings below may reference images not displayed]

FINDINGS: CT HEAD FINDINGS

Brain: No evidence of acute infarction, hemorrhage, hydrocephalus,
extra-axial collection or mass lesion/mass effect. Right
parieto-occipital encephalomalacia with porencephaly of the right
lateral ventricle is unchanged.

Vascular: No hyperdense vessel or unexpected calcification.

Skull: No acute abnormality.  Remote right craniotomy again seen.

Other: None.

CT MAXILLOFACIAL FINDINGS

Osseous: No fracture or mandibular dislocation. No destructive
process.

Orbits: Negative. No traumatic or inflammatory finding.

Sinuses: Clear.

Soft tissues: Negative.

CT CERVICAL SPINE FINDINGS

Alignment: Maintained.

Skull base and vertebrae: The patient has an acute fracture of the
anterior aspect of the left lateral mass C2. There is slight
anterior displacement of the fracture fragment and minimal
impaction. No other fracture is identified. No lytic or sclerotic
lesion.

Soft tissues and spinal canal: No prevertebral fluid or swelling. No
visible canal hematoma.

Disc levels: Loss of disc space height and endplate spurring most
notable at C5-6 and C6-7. Scattered facet degenerative change is
worst on the left at C4-5.

Upper chest: Lung apices are clear with centrilobular emphysema
noted.

Other: None.
IMPRESSION: Mildly impacted and anteriorly displaced fracture of the left
lateral mass of C2. No other acute abnormality head, face or
cervical spine.

Postoperative change of right craniotomy with underlying
encephalomalacia, unchanged.

Cervical degenerative disease C5-6 and C6-7.

Emphysema (ICOLV-UPY.S).

Critical Value/emergent results were called by telephone at the time
of interpretation on 08/02/2020 at [DATE] to provider PIU S
PYRRAIT , who verbally acknowledged these results.

## 2022-09-12 IMAGING — CT CT MAXILLOFACIAL W/O CM
3 of 6 series · 14 of 47 positions shown, 16 images · non-contrast
Comparison: Head and cervical spine CT scan 04/24/2020.

CLINICAL DATA: Status post fall on stairs today. Neck pain. Initial
encounter.

EXAM:
CT HEAD WITHOUT CONTRAST
CT MAXILLOFACIAL WITHOUT CONTRAST
CT CERVICAL SPINE WITHOUT CONTRAST
TECHNIQUE: Multidetector CT imaging of the head, cervical spine, and
maxillofacial structures were performed using the standard protocol
without intravenous contrast. Multiplanar CT image reconstructions
of the cervical spine and maxillofacial structures were also
generated.

[Series 3: maxilllofacial 2.0 hr40 3 · axial · 0.35mm/px · z∈[-244,-122]mm · 8 of 73 slices shown, 10 images]
[im 6/73  brain]
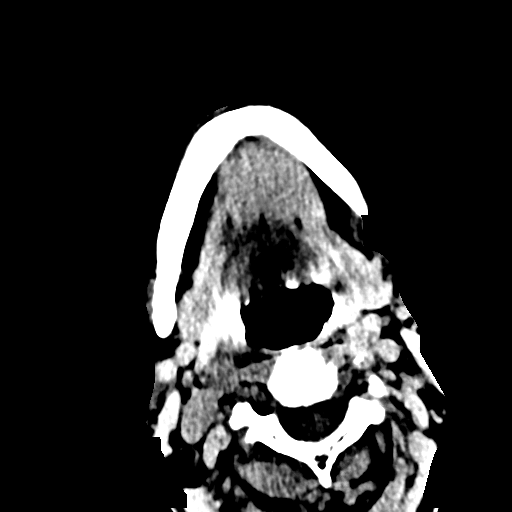
[im 6/73  bone]
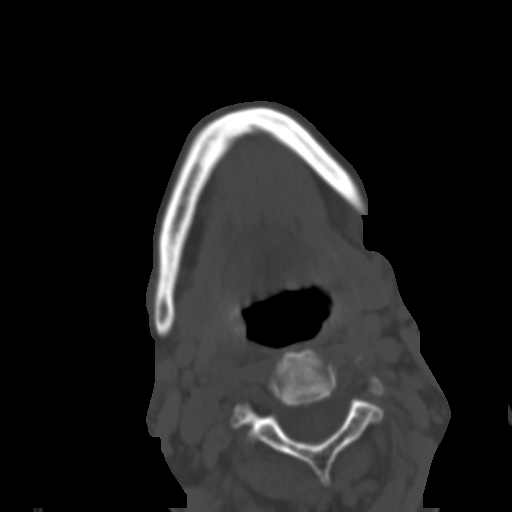
[im 16/73  bone]
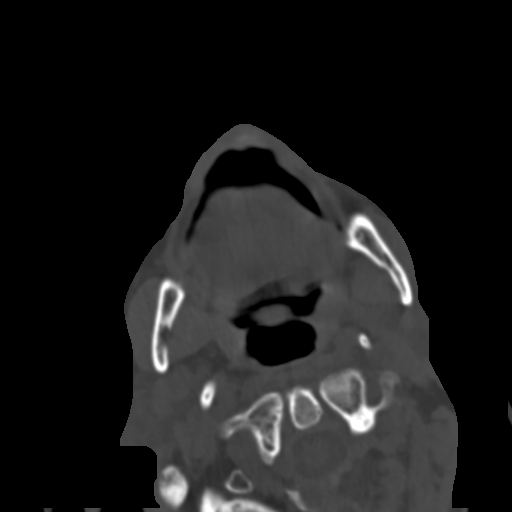
[im 26/73  bone]
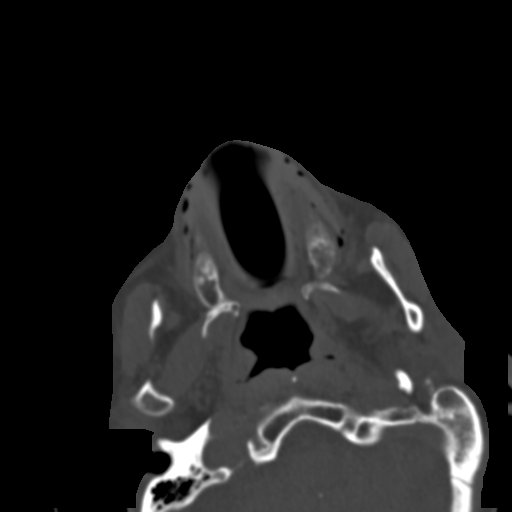
[im 31/73  bone]
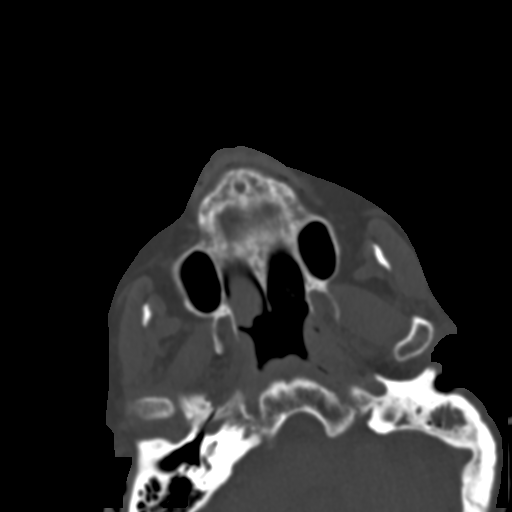
[im 42/73  brain]
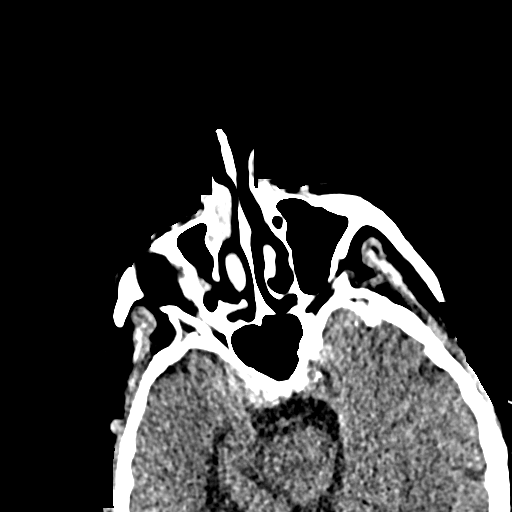
[im 42/73  bone]
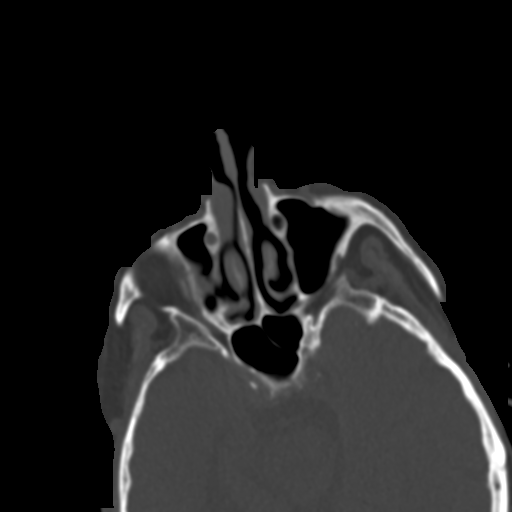
[im 47/73  bone]
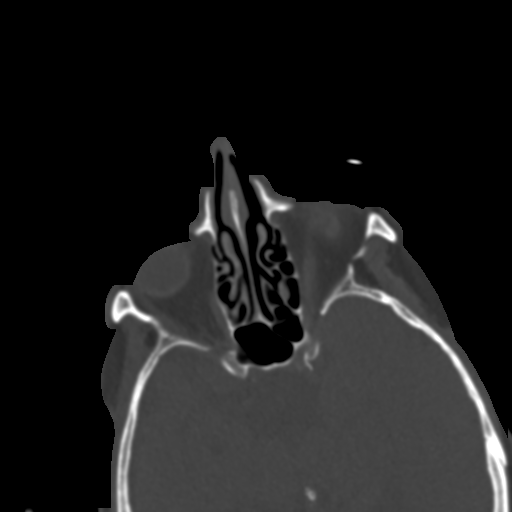
[im 57/73  bone]
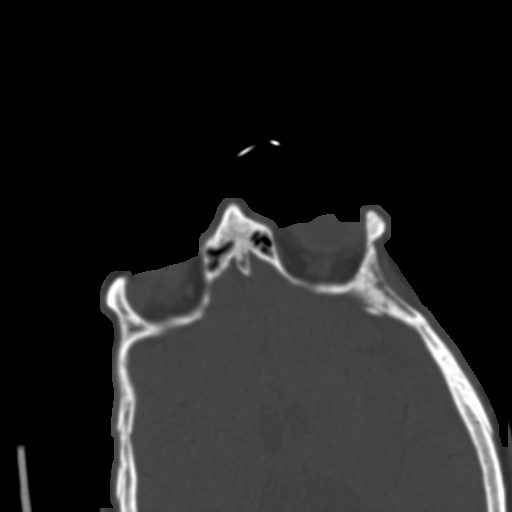
[im 67/73  bone]
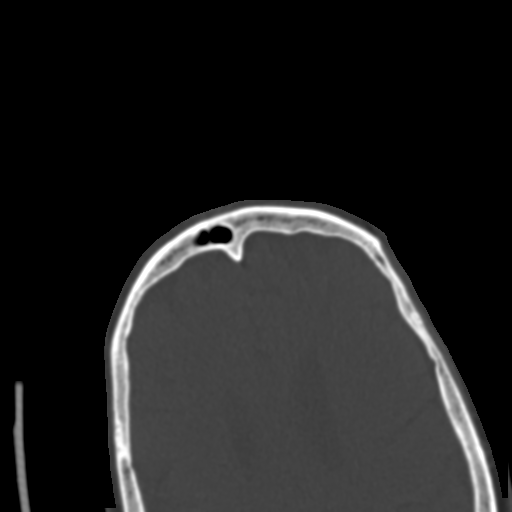

[Series 7: st cor · coronal · 0.31mm/px · 3 of 132 slices shown]
[im 33/132  bone]
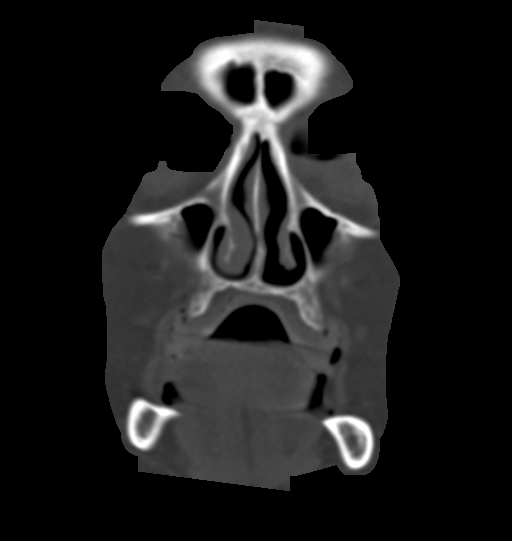
[im 66/132  bone]
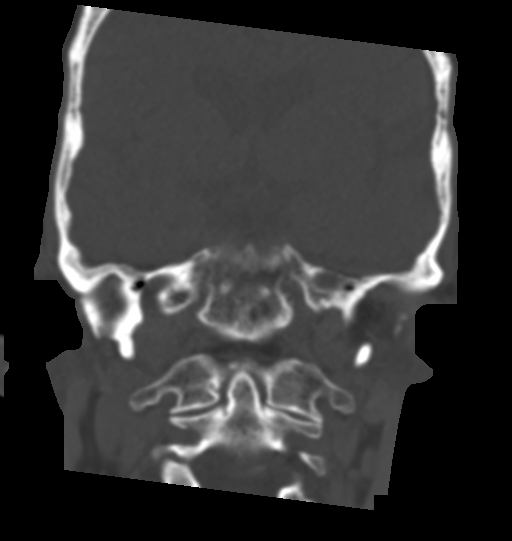
[im 99/132  bone]
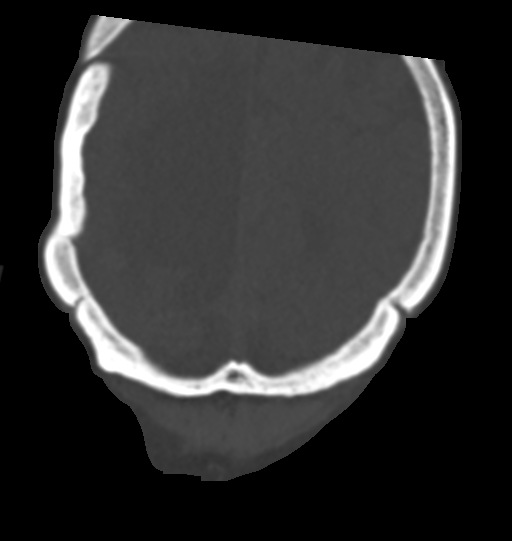

[Series 10: bone sag · sagittal · 0.31mm/px · 3 of 89 slices shown]
[im 4/89  bone]
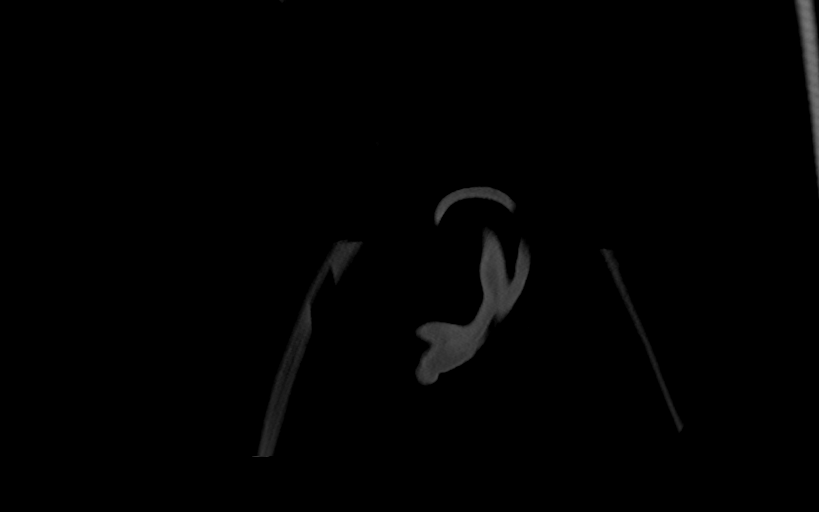
[im 32/89  bone]
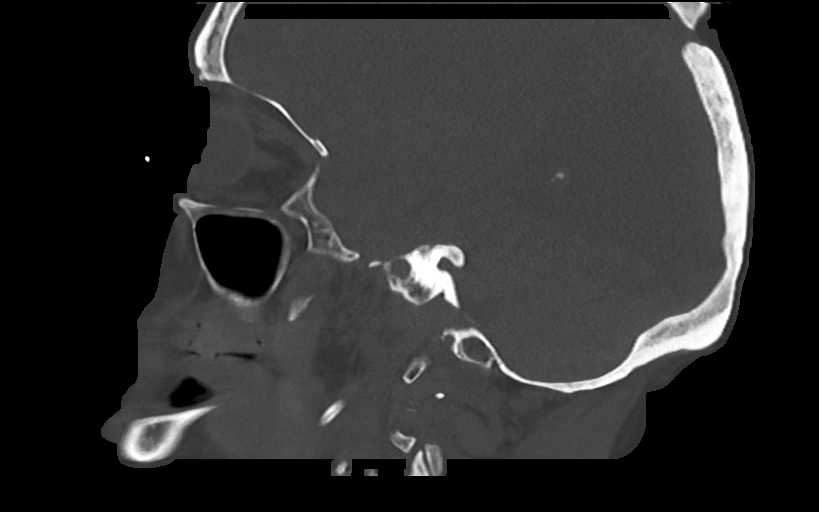
[im 60/89  bone]
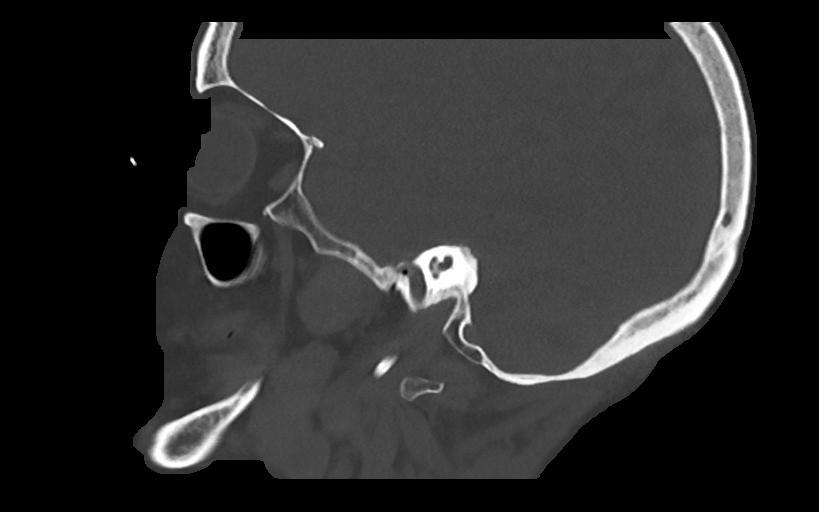

[14 of 47 positions shown; findings below may reference images not displayed]

FINDINGS: CT HEAD FINDINGS

Brain: No evidence of acute infarction, hemorrhage, hydrocephalus,
extra-axial collection or mass lesion/mass effect. Right
parieto-occipital encephalomalacia with porencephaly of the right
lateral ventricle is unchanged.

Vascular: No hyperdense vessel or unexpected calcification.

Skull: No acute abnormality.  Remote right craniotomy again seen.

Other: None.

CT MAXILLOFACIAL FINDINGS

Osseous: No fracture or mandibular dislocation. No destructive
process.

Orbits: Negative. No traumatic or inflammatory finding.

Sinuses: Clear.

Soft tissues: Negative.

CT CERVICAL SPINE FINDINGS

Alignment: Maintained.

Skull base and vertebrae: The patient has an acute fracture of the
anterior aspect of the left lateral mass C2. There is slight
anterior displacement of the fracture fragment and minimal
impaction. No other fracture is identified. No lytic or sclerotic
lesion.

Soft tissues and spinal canal: No prevertebral fluid or swelling. No
visible canal hematoma.

Disc levels: Loss of disc space height and endplate spurring most
notable at C5-6 and C6-7. Scattered facet degenerative change is
worst on the left at C4-5.

Upper chest: Lung apices are clear with centrilobular emphysema
noted.

Other: None.
IMPRESSION: Mildly impacted and anteriorly displaced fracture of the left
lateral mass of C2. No other acute abnormality head, face or
cervical spine.

Postoperative change of right craniotomy with underlying
encephalomalacia, unchanged.

Cervical degenerative disease C5-6 and C6-7.

Emphysema (ICOLV-UPY.S).

Critical Value/emergent results were called by telephone at the time
of interpretation on 08/02/2020 at [DATE] to provider PIU S
PYRRAIT , who verbally acknowledged these results.

## 2022-09-12 IMAGING — DX DG HAND COMPLETE 3+V*L*
1 series · 3 of 3 positions shown · non-contrast
Comparison: 03/12/2006

CLINICAL DATA: Left middle finger pain following a fall down
stairs.

EXAM:
LEFT HAND - COMPLETE 3+ VIEW

[Series 1: hand · 0.14mm/px · 3 of 3 slices shown]
[im 1/3]
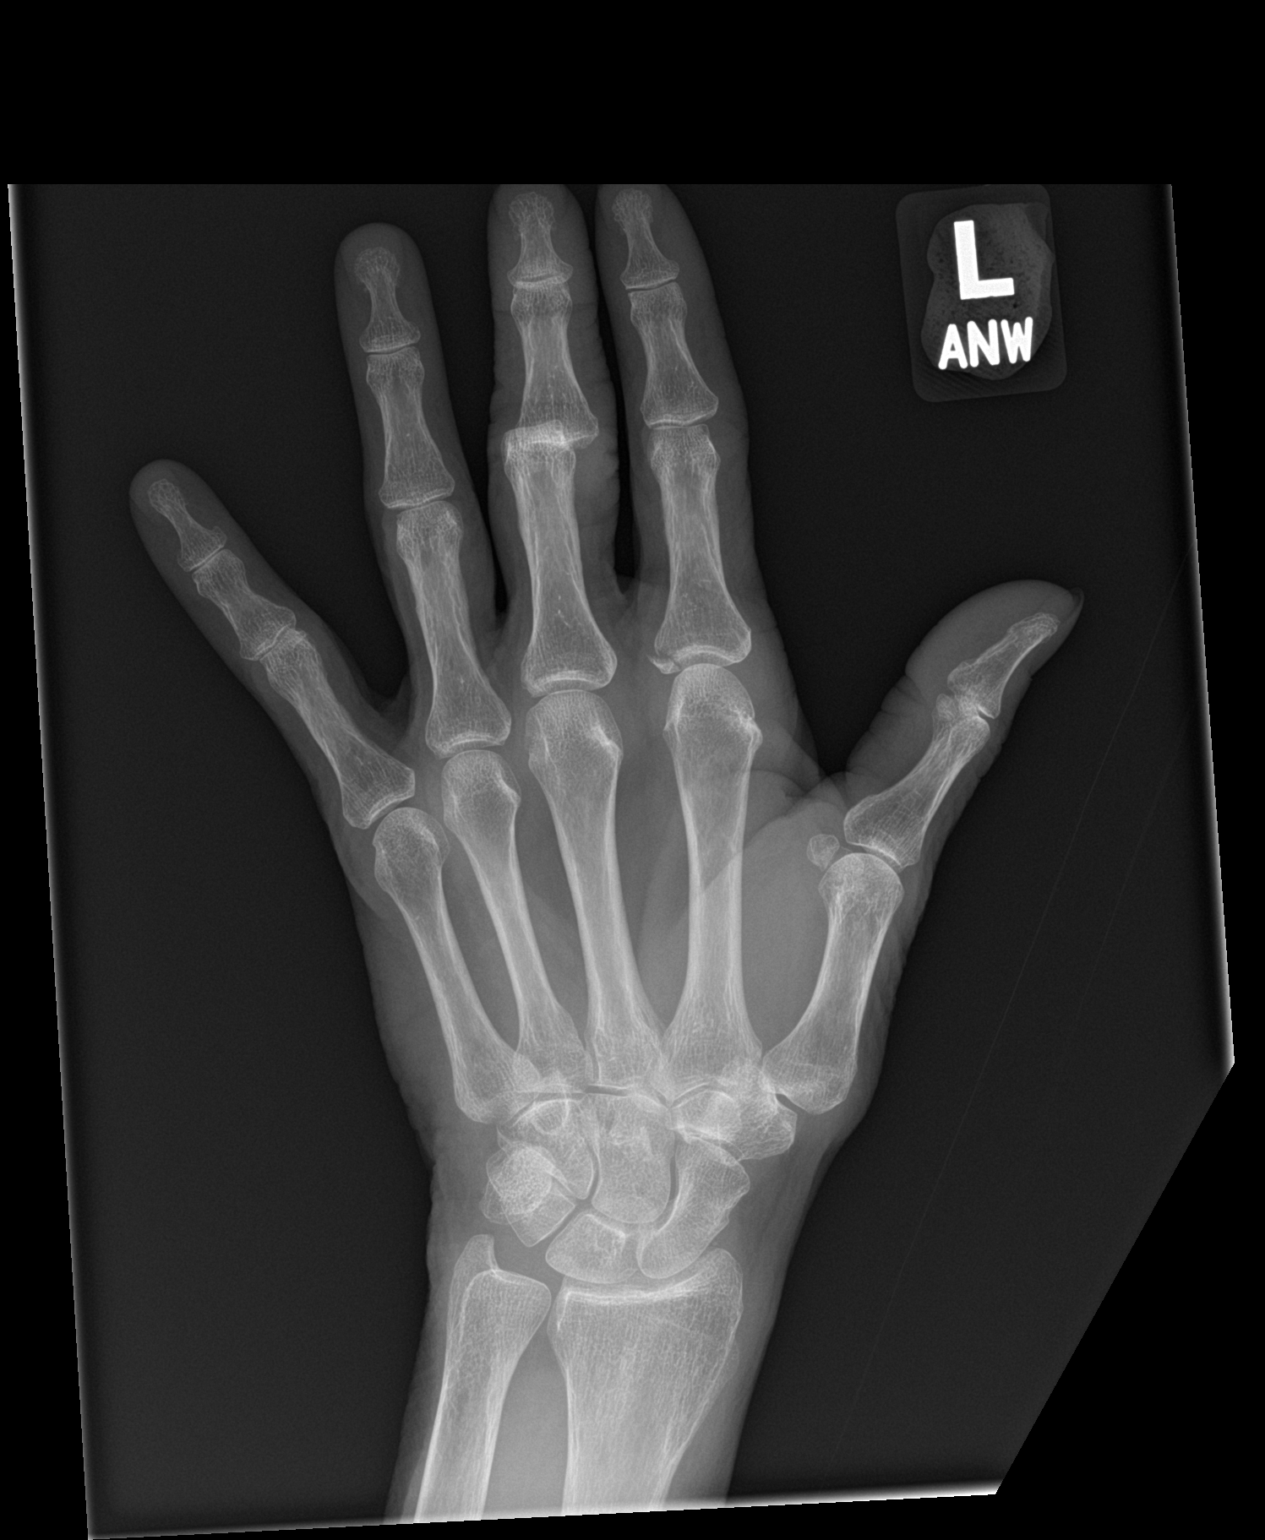
[im 2/3]
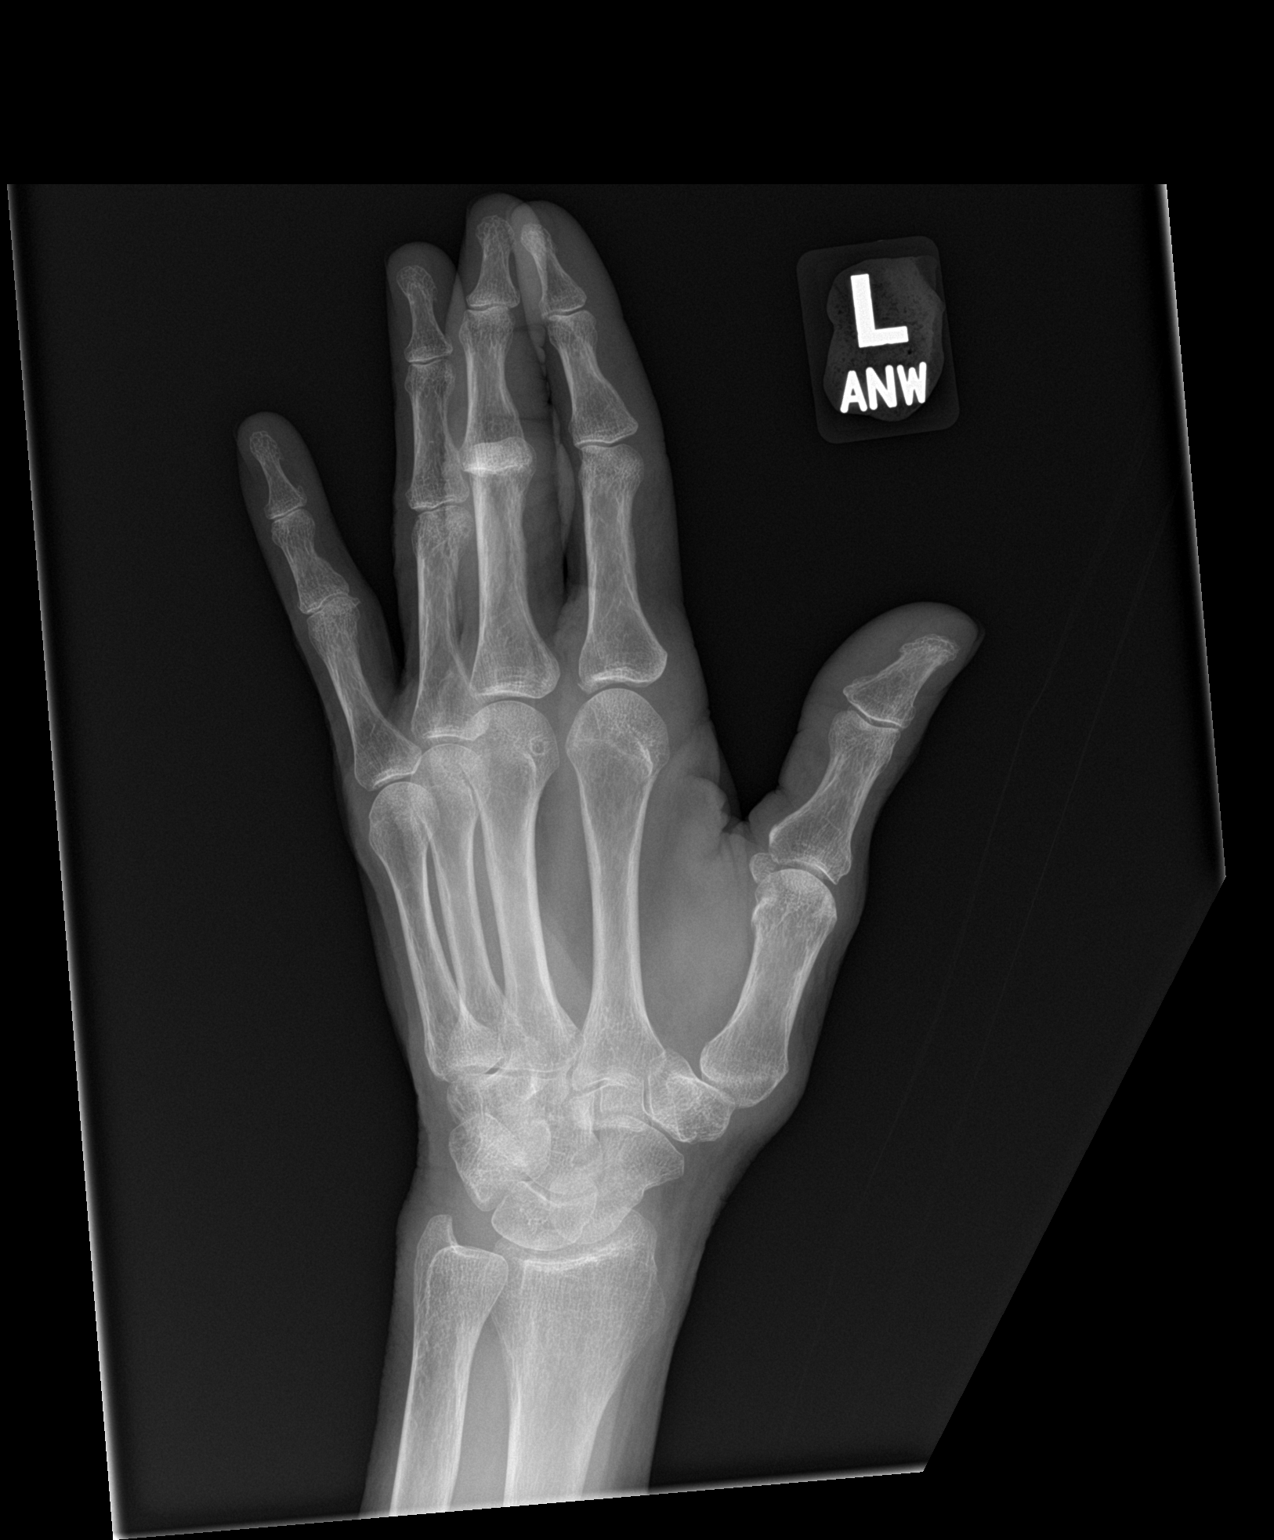
[im 3/3]
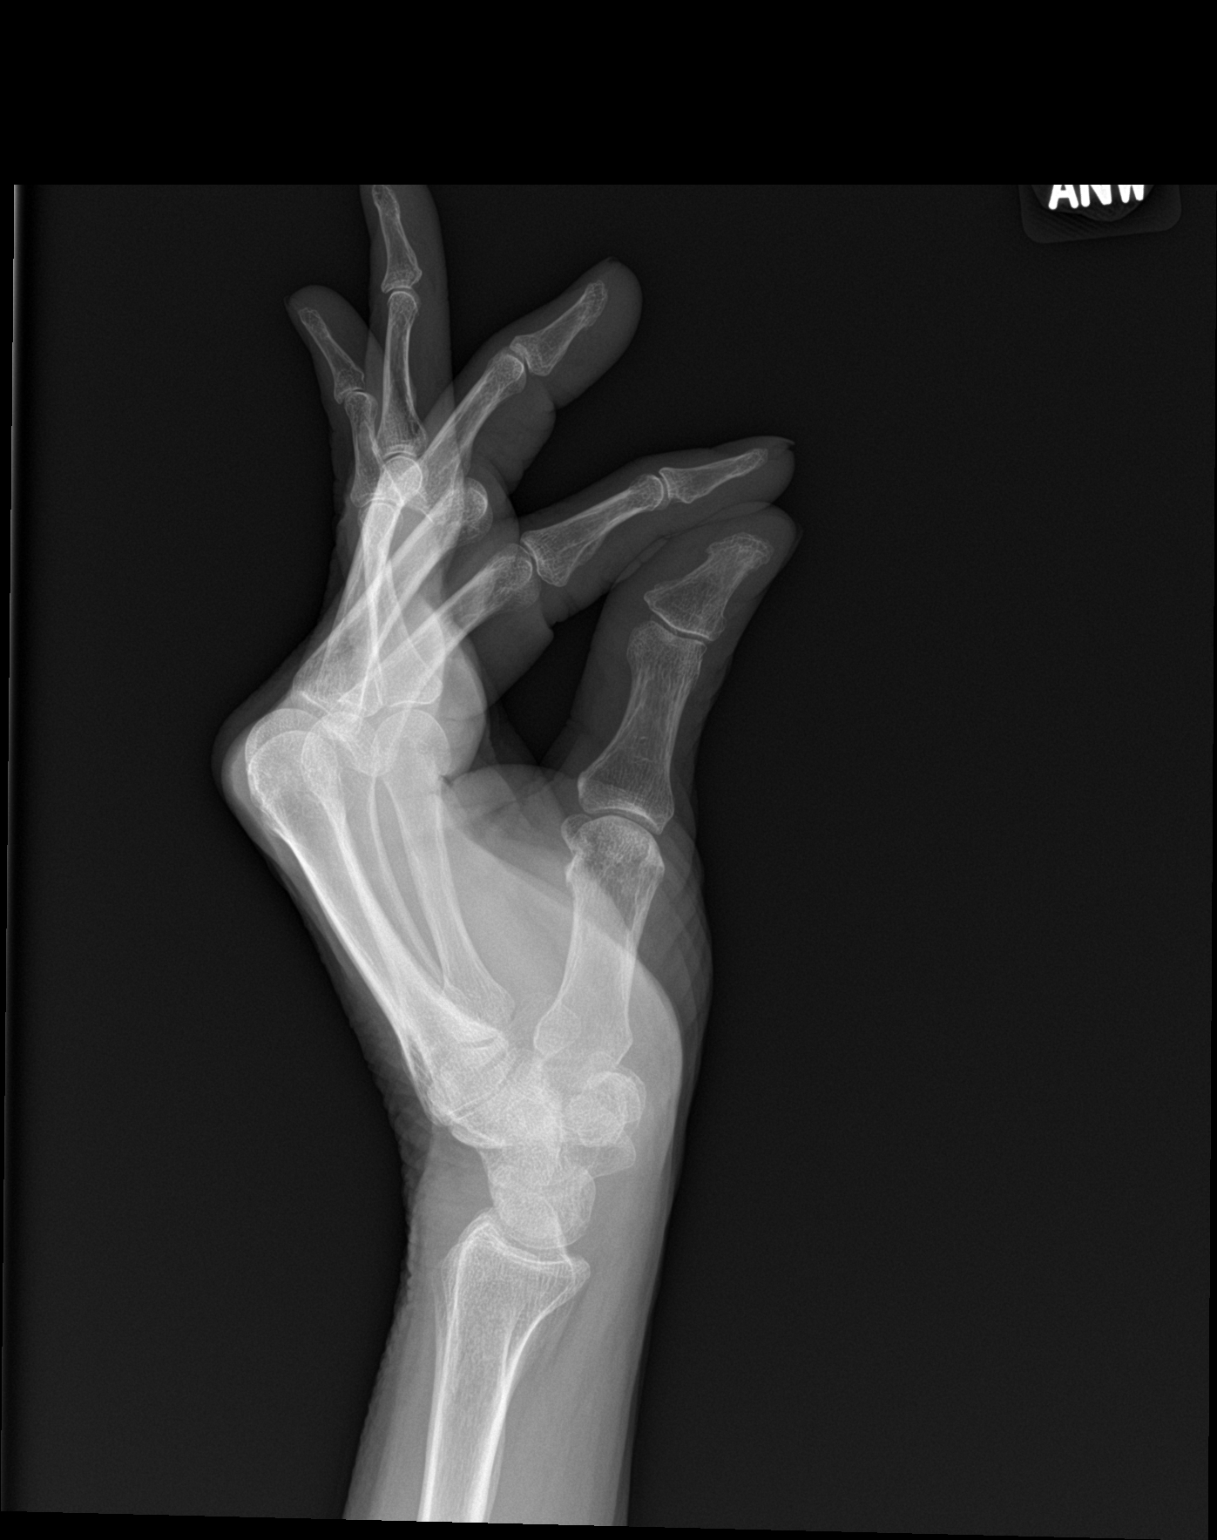

[3 of 3 positions shown; findings below may reference images not displayed]

FINDINGS: Acute fracture at the ulnar aspect of the base of the 2nd proximal
phalanx extending into the 2nd MCP joint. Mild displacement of the
corner fragment. There is also dorsal and proximal dislocation of
the 3rd middle phalanx relative to the proximal phalanx without
associated visible fracture.
IMPRESSION: 1. Fracture of the base of the 2nd proximal phalanx with associated
intra-articular extension.
2. Dislocation at the 3rd PIP joint.

## 2022-09-12 IMAGING — CT CT CHEST-ABD-PELV W/ CM
2 of 5 series · 12 of 36 positions shown, 14 images · IV contrast (APPLIED)
Comparison: Chest CT 08/26/2019,

CLINICAL DATA: 64-year-old female with a history of fall

EXAM:
CT CHEST, ABDOMEN, AND PELVIS WITH CONTRAST
TECHNIQUE: Multidetector CT imaging of the chest, abdomen and pelvis was
performed following the standard protocol during bolus
administration of intravenous contrast.
CONTRAST:  100mL OMNIPAQUE IOHEXOL 300 MG/ML  SOLN

[Series 3: cap w · axial · 0.73mm/px · z∈[-958,-304]mm · 9 of 369 slices shown, 11 images]
[im 21/369  mediastinal]
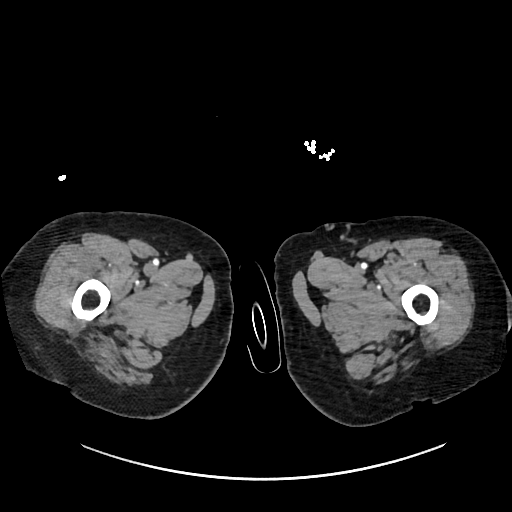
[im 21/369  bone]
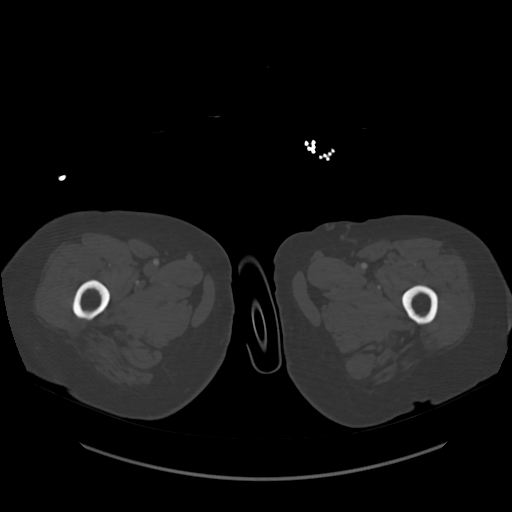
[im 62/369  mediastinal]
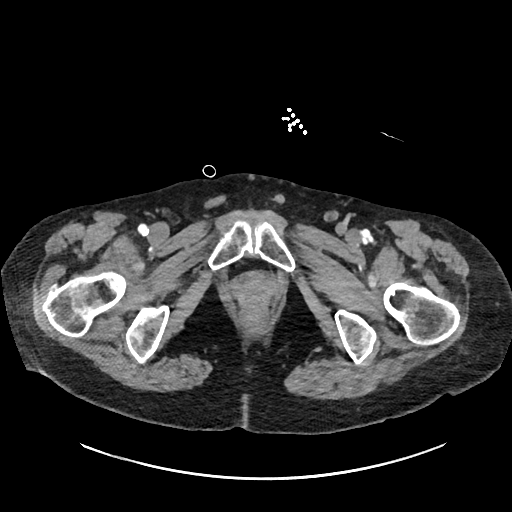
[im 103/369  mediastinal]
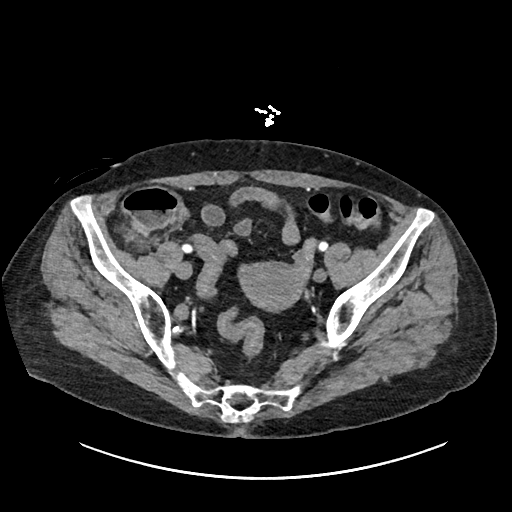
[im 144/369  mediastinal]
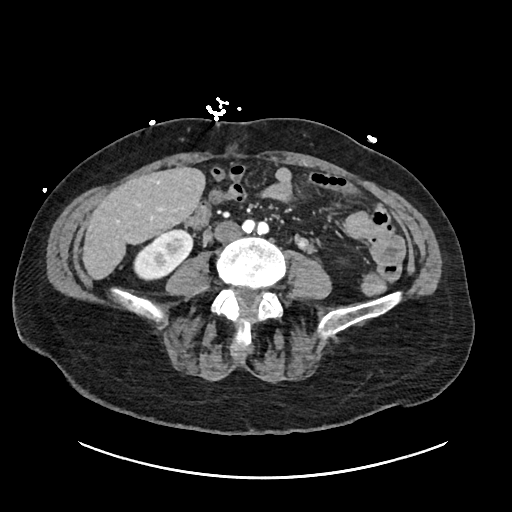
[im 185/369  mediastinal]
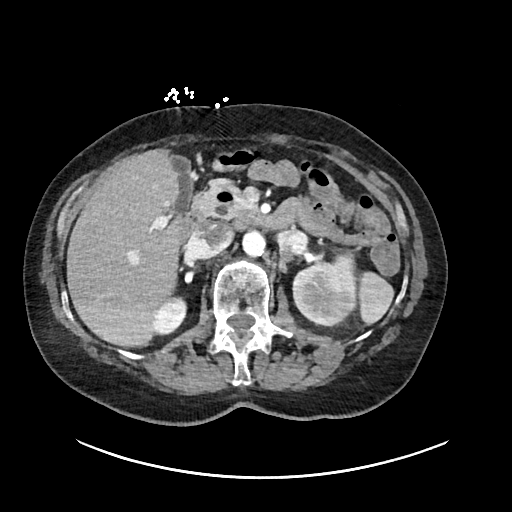
[im 225/369  mediastinal]
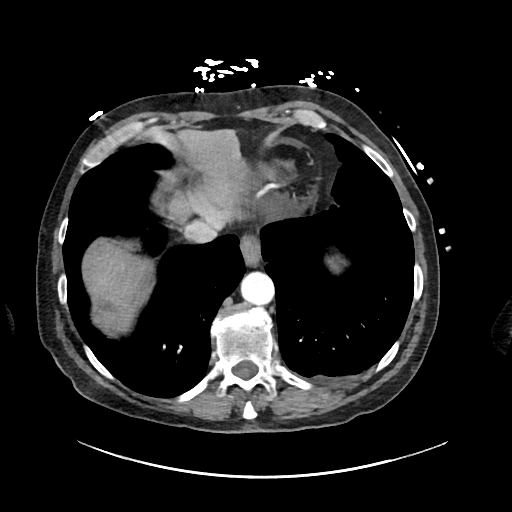
[im 266/369  mediastinal]
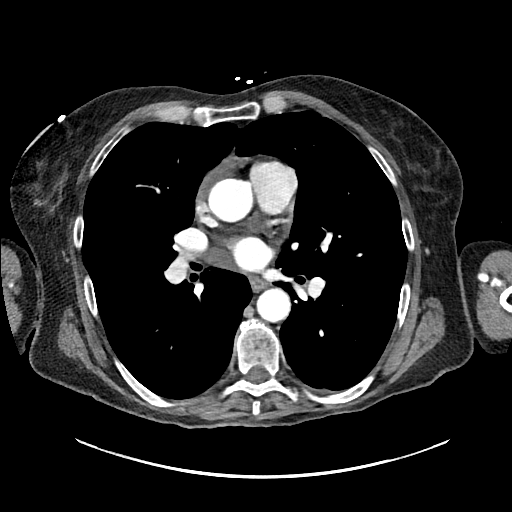
[im 307/369  mediastinal]
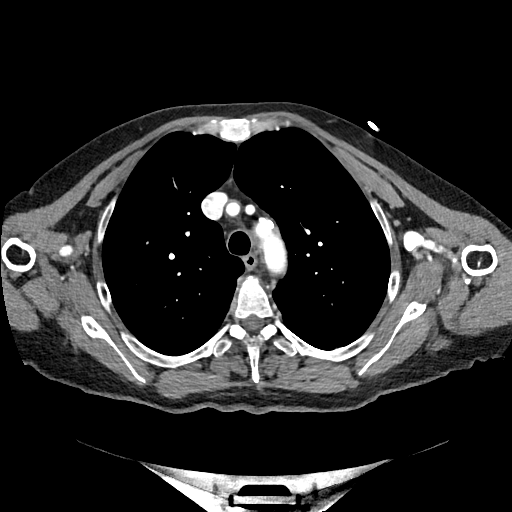
[im 348/369  mediastinal]
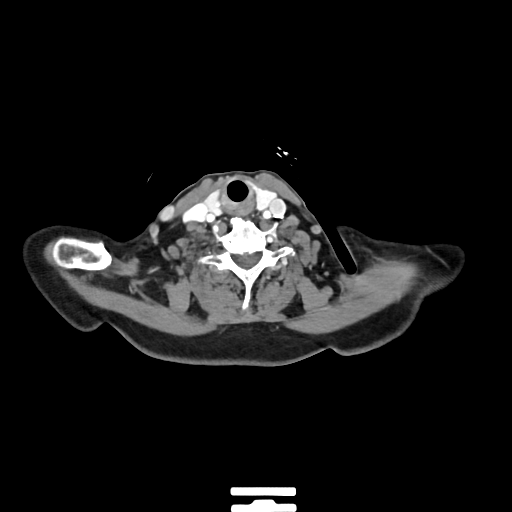
[im 348/369  bone]
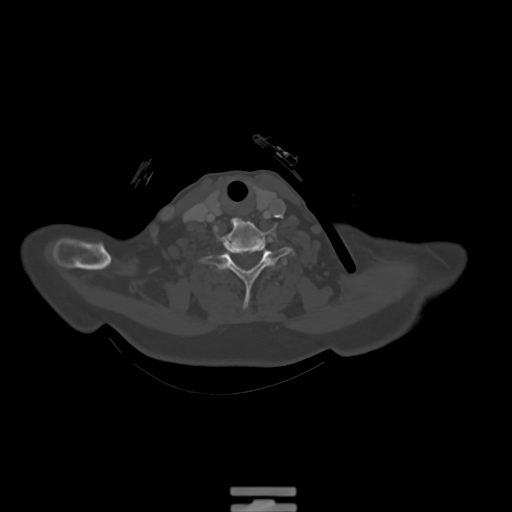

[Series 6: cor · coronal · 0.68mm/px · 3 of 148 slices shown]
[im 30/148  mediastinal]
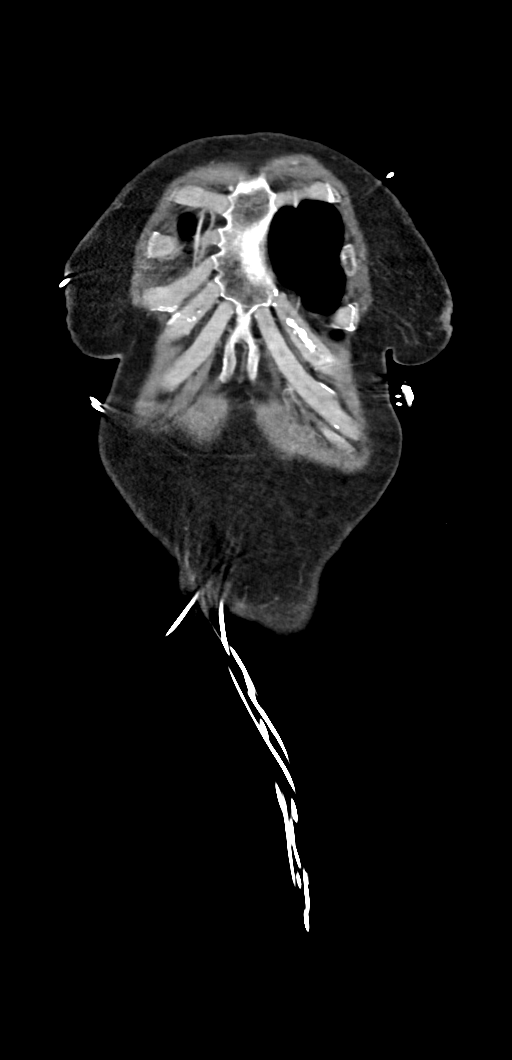
[im 59/148  mediastinal]
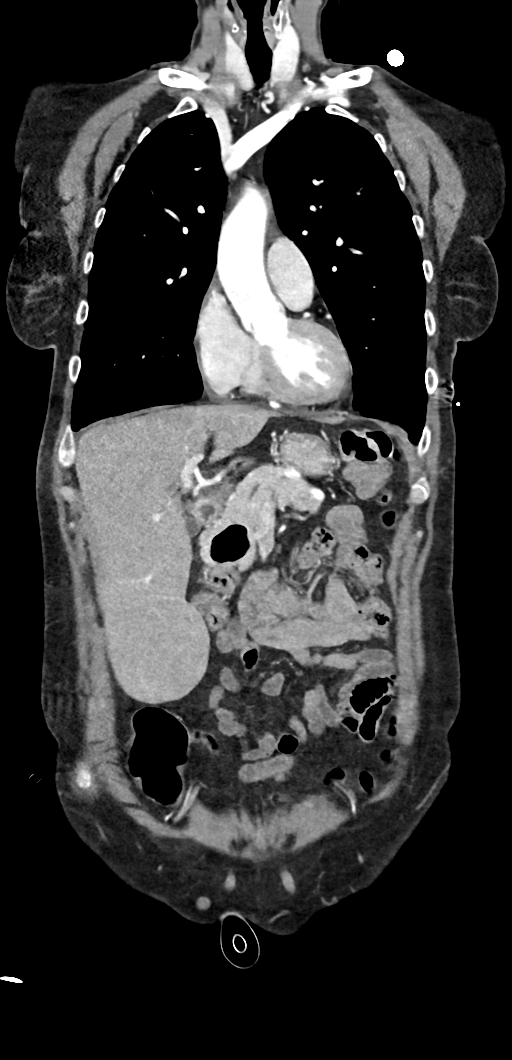
[im 89/148  mediastinal]
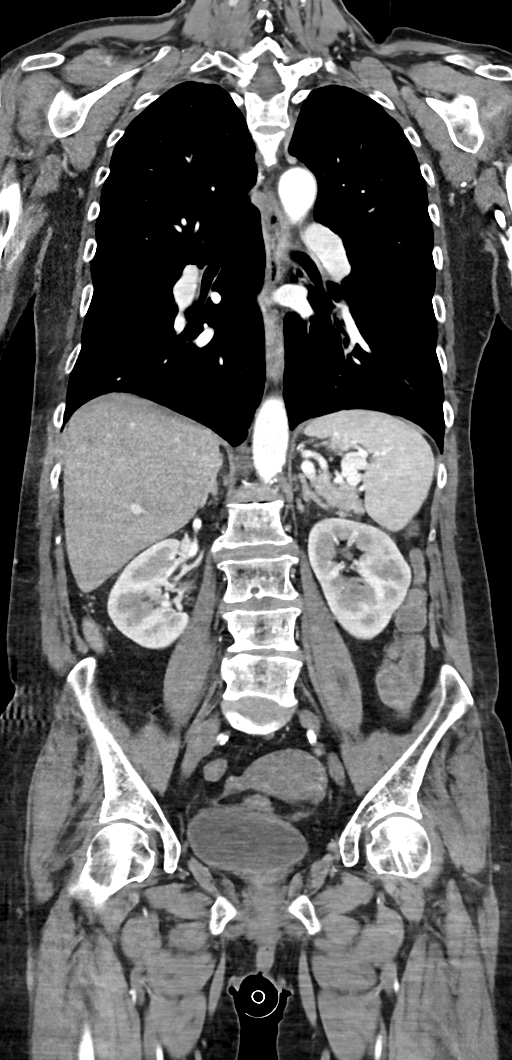

[12 of 36 positions shown; findings below may reference images not displayed]

FINDINGS: CT CHEST FINDINGS

Cardiovascular: Heart size within normal limits. Mild pericardial
fluid/thickening, relatively unchanged from the comparison CT.
Calcifications of the aortic valve. No significant coronary
calcifications. Unremarkable course caliber and contour of the
thoracic aorta. Mild atherosclerotic changes. Branch vessels are
patent. Mild atherosclerosis of the thoracic aorta.

No filling defects within the proximal pulmonary arteries.

Mediastinum/Nodes: Unremarkable thoracic inlet. Unremarkable
thoracic esophagus. Small lymph nodes of the mediastinum.

Lungs/Pleura: Paraseptal and centrilobular emphysema. No
endotracheal/endobronchial debris. No pneumothorax or pleural
effusion. No confluent airspace disease. Atelectatic changes at the
left lung base.

Musculoskeletal: Degenerative changes of the lower cervical and the
thoracic spine. No bony canal narrowing. No acute fracture line
identified. No acute displaced rib fracture.

CT ABDOMEN PELVIS FINDINGS

Hepatobiliary: Unremarkable liver.  Unremarkable gallbladder.

Pancreas: Unremarkable

Spleen: Unremarkable

Adrenals/Urinary Tract:

- Right adrenal gland:  Unremarkable

- Left adrenal gland: Unremarkable.

- Right kidney: No hydronephrosis, nephrolithiasis, inflammation, or
ureteral dilation. No focal lesion.

- Left Kidney: No hydronephrosis. No nephrolithiasis. There is a
wedge-shaped region of heterogeneous enhancement and loss of the
corticomedullary junction in the posterior left kidney involving
both cortex and the medullary tissue. This is most apparent on the
delayed renal imaging. There is a second region more anteriorly
decreased attenuation involving the corticomedullary region. No
adenopathy. Circumferential enhancement of the urothelium in the
left renal pelvis is present, asymmetric from the right. No
nephrolithiasis. No ureteral dilation.

- Urinary Bladder: Urinary bladder relatively decompressed.

Stomach/Bowel:

- Stomach: Hiatal hernia.  Otherwise unremarkable stomach.

- Small bowel: Unremarkable

- Appendix: Normal.

- Colon: Unremarkable.

Vascular/Lymphatic: Atherosclerotic changes of the lower thoracic
aorta and the abdominal aorta. No aneurysm or dissection.

Atherosclerotic changes of the bilateral iliac arteries which remain
patent. Proximal profunda femoris and SFA patent bilaterally.

Unremarkable appearance of the IVC. There is compression of the left
common iliac vein in the typical location below the right common
iliac artery in [place] configuration. Associated small
caliber left common iliac vein and external iliac vein. Collateral
venous drainage involves the anterior pelvic region from the left
great saphenous to the right great saphenous via the
pudendal/epigastric vasculature. Engorged left gonadal vein.

Reproductive: Unremarkable uterus and adnexa.

Other: None

Musculoskeletal: Negative for acute fracture or malalignment of the
spine. Degenerative changes of the lumbar region. No bony canal
narrowing. Minimal degenerative changes of the hips.
IMPRESSION: No acute traumatic finding of the chest, abdomen, pelvis CT.

There is a wedge shaped lesion within the posterior left kidney,
involving both the cortical and medullary tissue with an additional
smaller focus of the anterior left kidney. Differential diagnosis
for this appearance is pyelonephritis (which is favored) as well as
renal malignancy such as renal lymphoma or urothelial carcinoma.
Correlation with urinalysis and/or urine cytology may be useful, as
well as short-term interval follow-up contrast-enhanced abdominal CT
after therapy.

Aortic Atherosclerosis (CEA02-JV6.6) and Emphysema (CEA02-US4.T).

[place] configuration of the left iliac vein. Correlation with
any left-sided chronic venous symptoms may be useful.

## 2022-09-12 IMAGING — DX DG FINGER MIDDLE 2+V*L*
1 series · 3 of 3 positions shown · non-contrast
Comparison: None.

CLINICAL DATA: Status post reduction of a left 3rd PIP joint
dislocation.

EXAM:
LEFT MIDDLE FINGER 2+V

[Series 1: finger · 0.14mm/px · 3 of 3 slices shown]
[im 1/3]
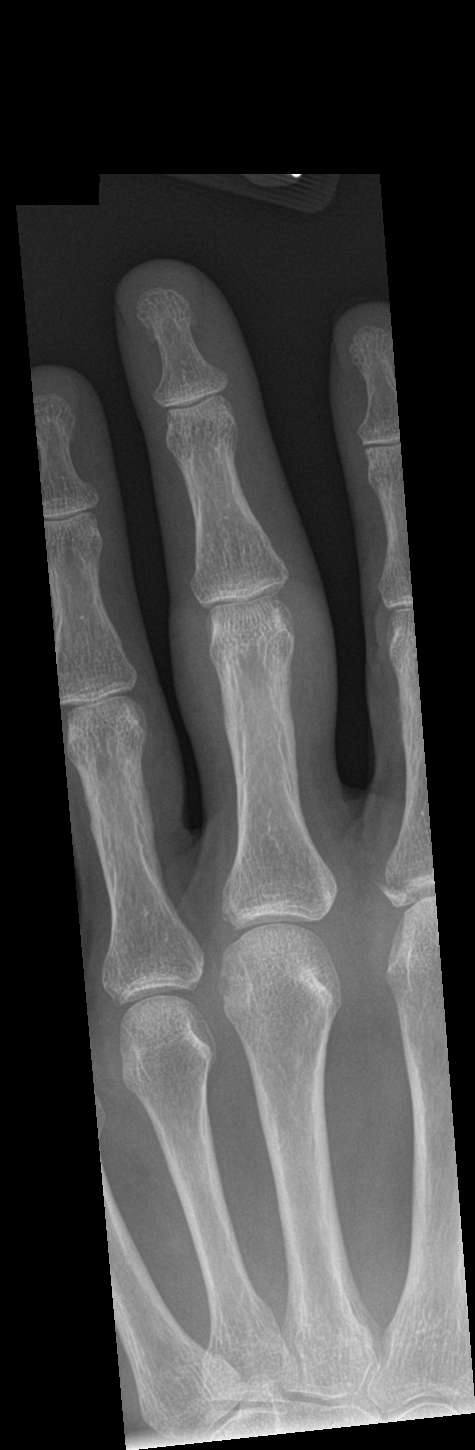
[im 2/3]
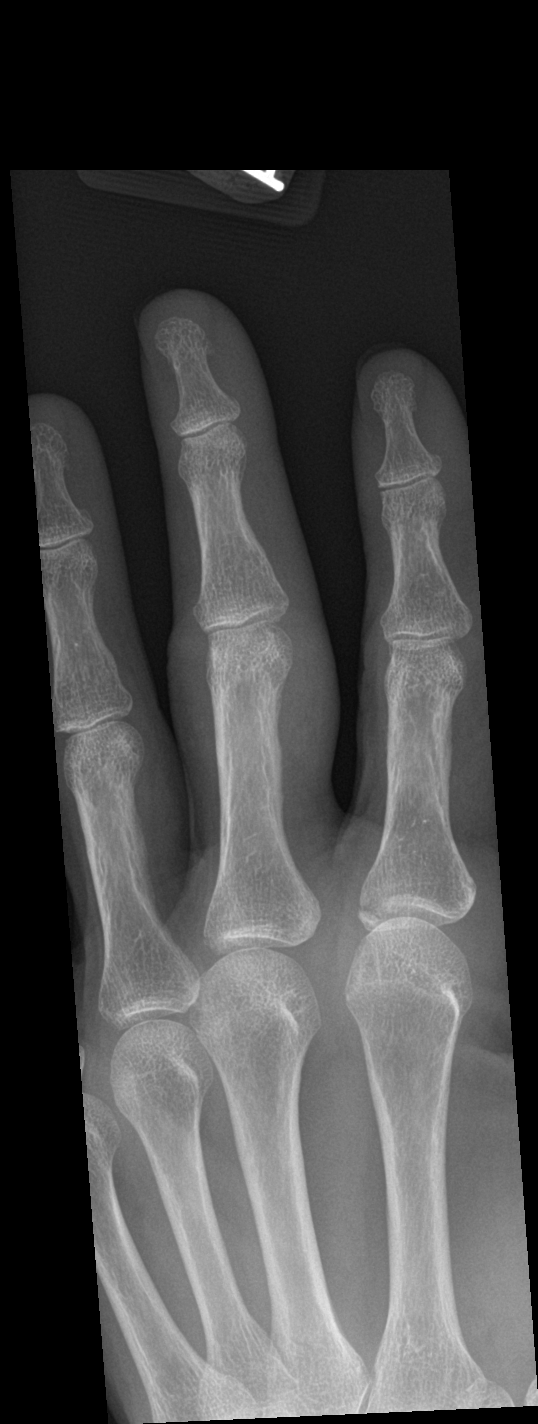
[im 3/3]
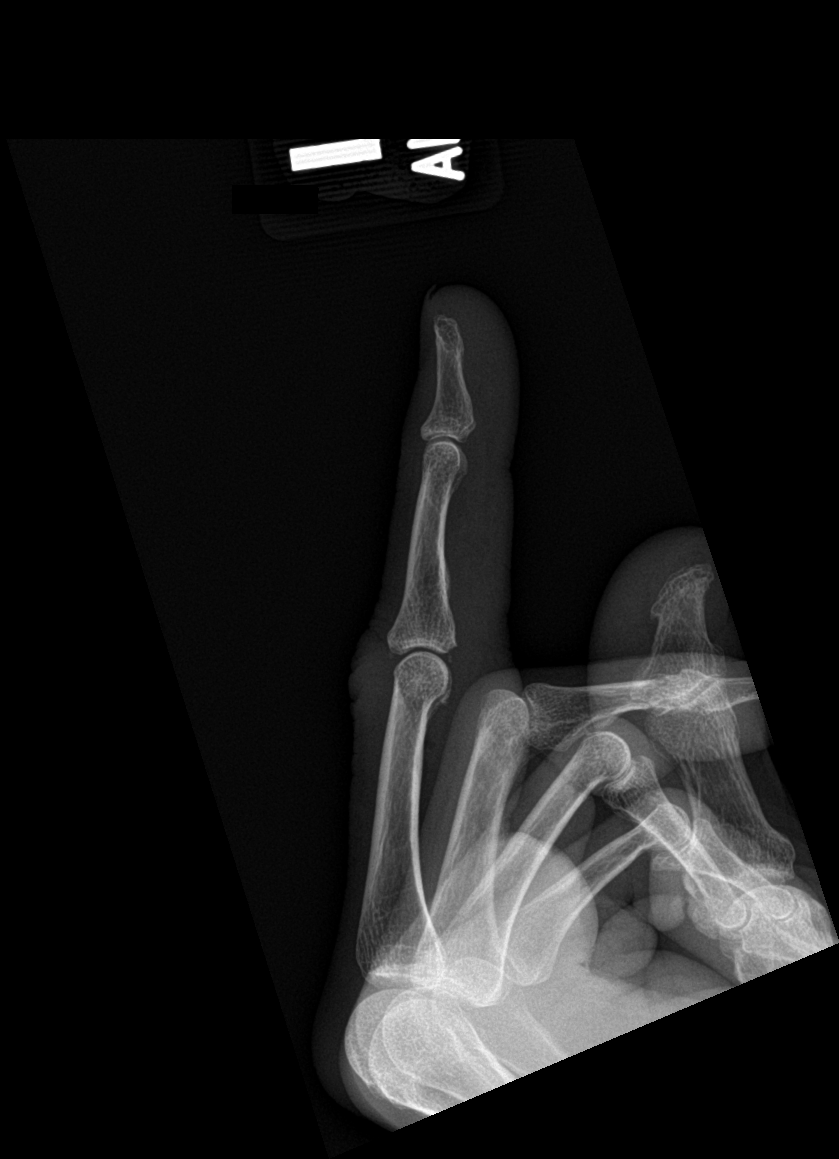

[3 of 3 positions shown; findings below may reference images not displayed]

FINDINGS: The previously demonstrated 3rd PIP joint dislocation has been
reduced. There are tiny avulsion fracture fragments ventral and
dorsal to the joint. There is associated soft tissue swelling. The
previously noted fracture of the base of the 2nd proximal phalanx is
unchanged.
IMPRESSION: 1. Status post reduction of the previously demonstrated 3rd PIP
joint dislocation with tiny avulsion fracture fragments ventral and
dorsal to the joint.
2. Stable fracture of the base of the 2nd proximal phalanx with
intra-articular extension.

## 2022-09-20 IMAGING — CT CT HEAD W/O CM
3 of 4 series · 15 of 47 positions shown, 18 images · non-contrast
Comparison: 08/02/2020

CLINICAL DATA: Fell.

EXAM:
CT HEAD WITHOUT CONTRAST
TECHNIQUE: Contiguous axial images were obtained from the base of the skull
through the vertex without intravenous contrast.

[Series 4: head 2.0 h70h · axial · 0.42mm/px · z∈[-103,+27]mm · 9 of 83 slices shown, 12 images]
[im 9/83  brain]
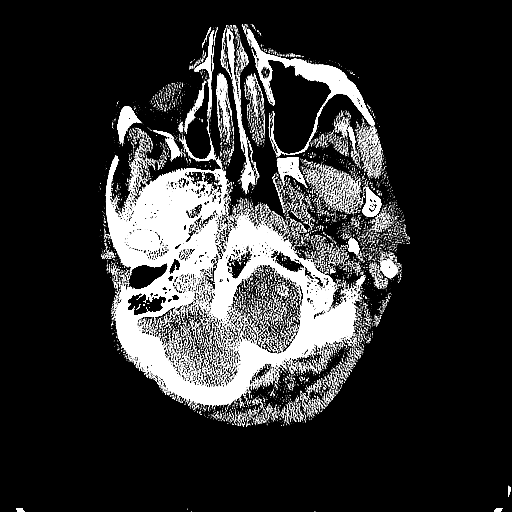
[im 9/83  bone]
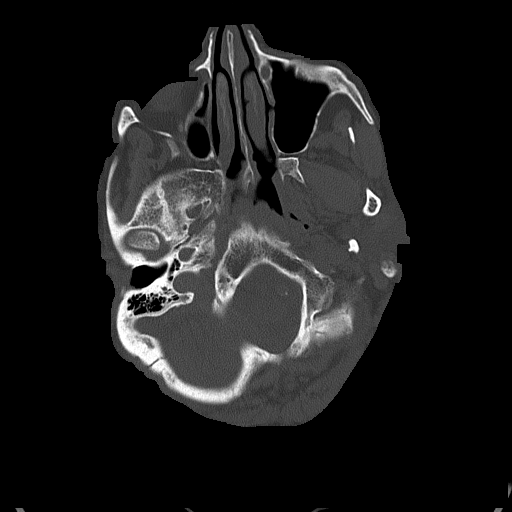
[im 17/83  brain]
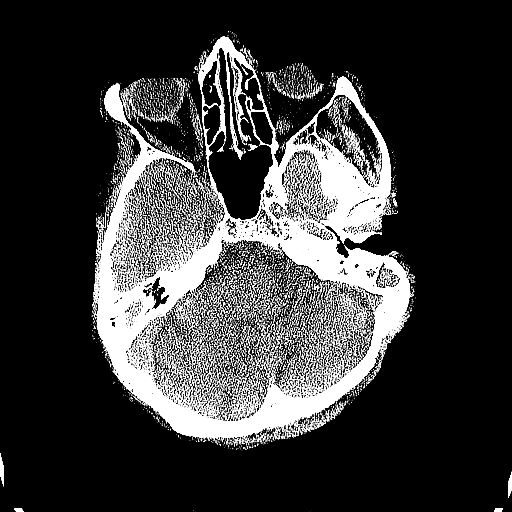
[im 25/83  brain]
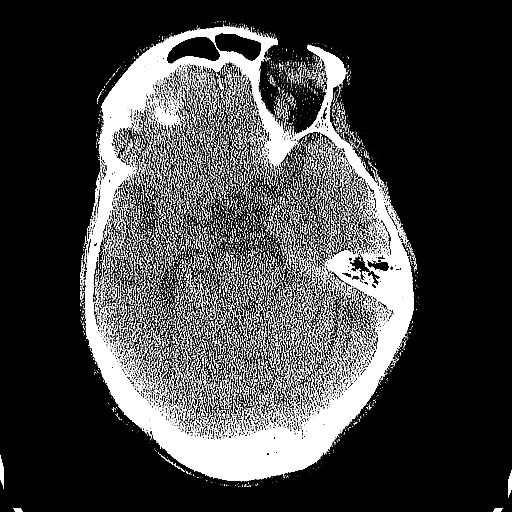
[im 33/83  brain]
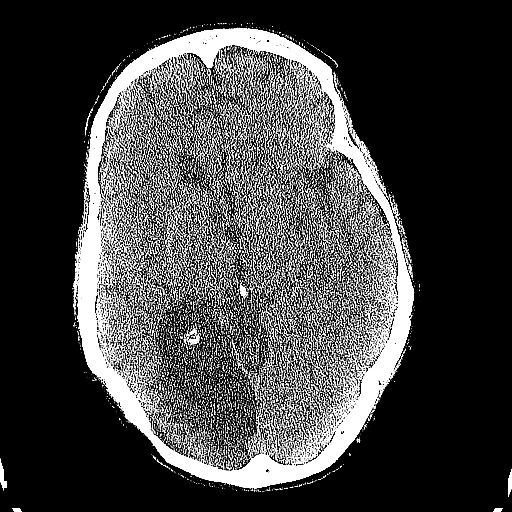
[im 42/83  brain]
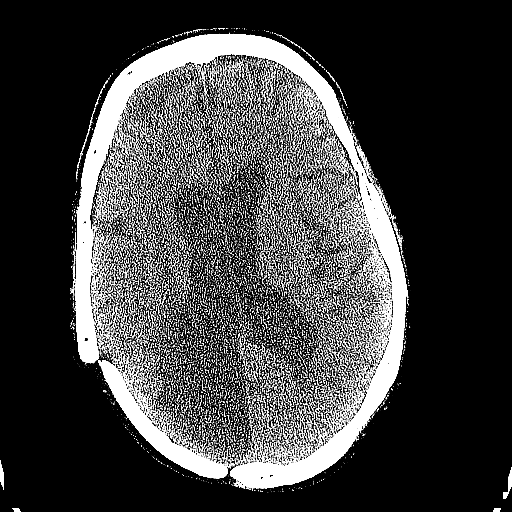
[im 42/83  bone]
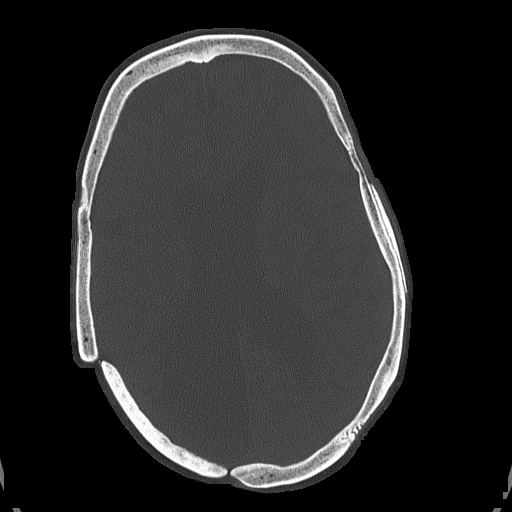
[im 50/83  brain]
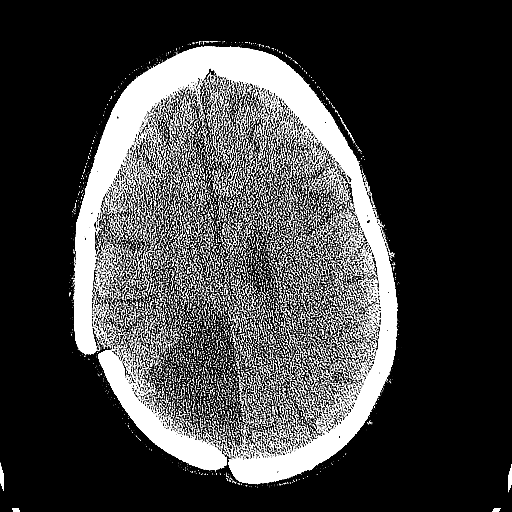
[im 58/83  brain]
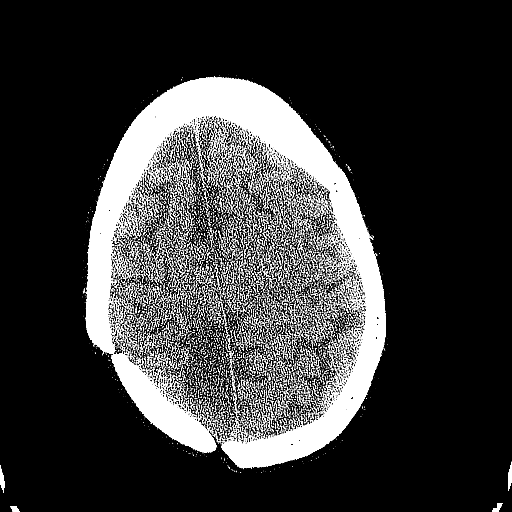
[im 66/83  brain]
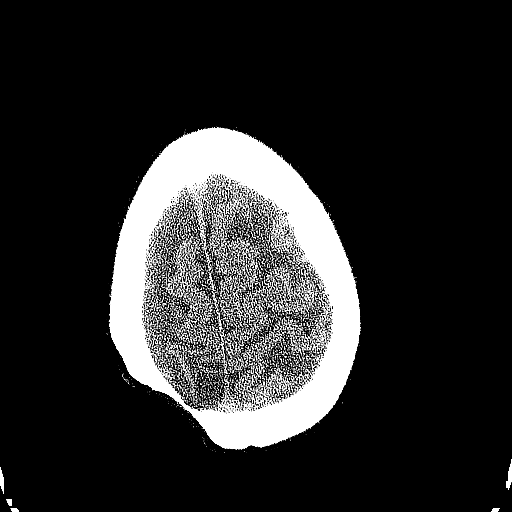
[im 74/83  brain]
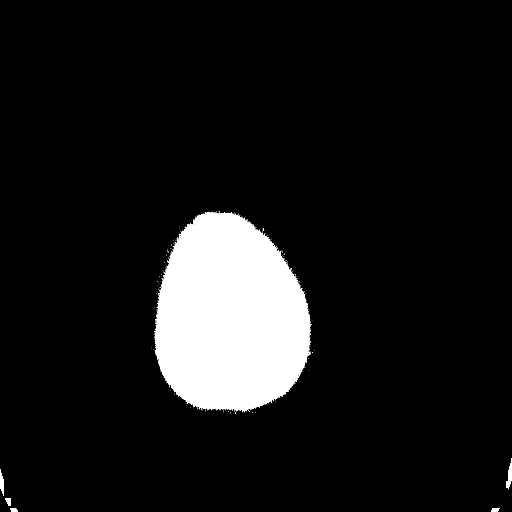
[im 74/83  bone]
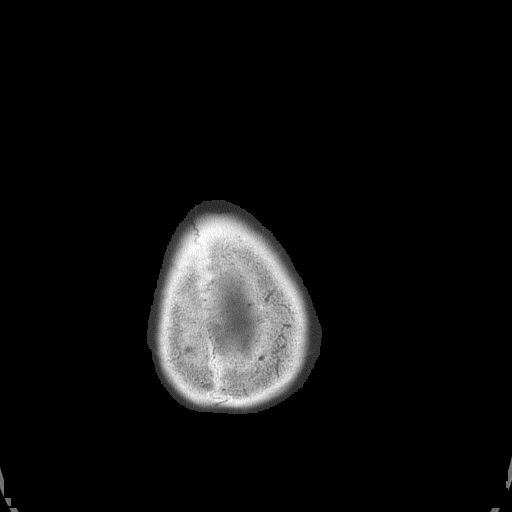

[Series 5: head 3.0 mpr cor · coronal · 0.32mm/px · 3 of 70 slices shown]
[im 24/70  brain]
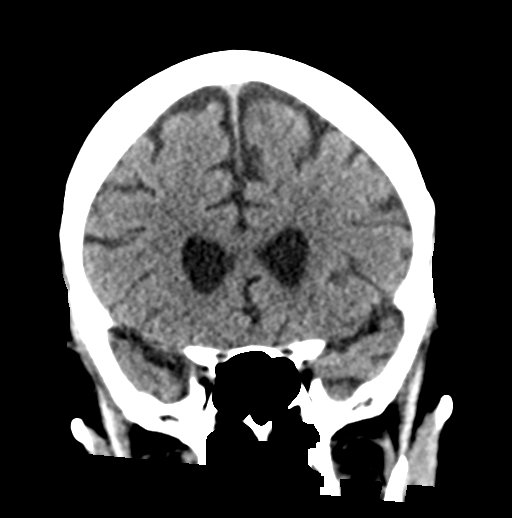
[im 31/70  brain]
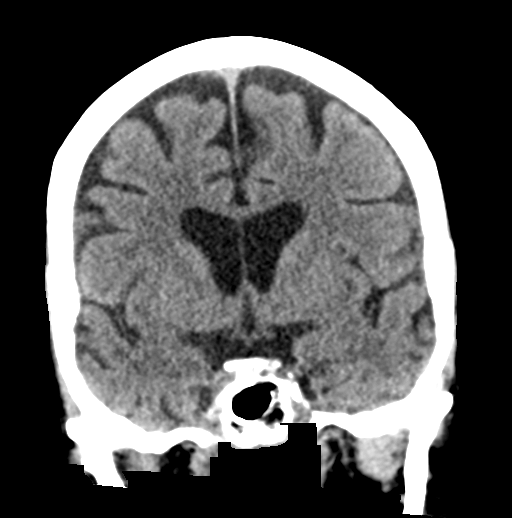
[im 39/70  brain]
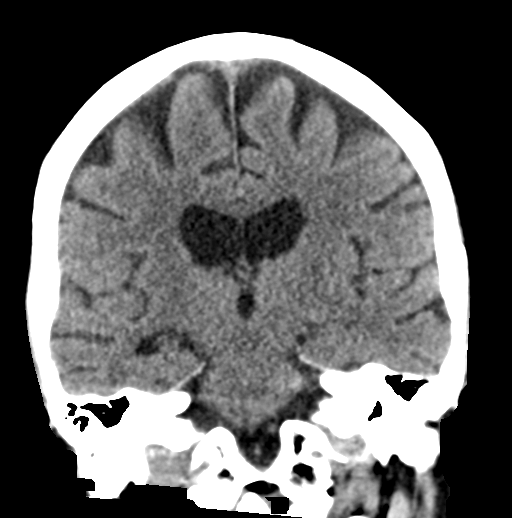

[Series 6: head 3.0 mpr sag · sagittal · 0.32mm/px · 3 of 55 slices shown]
[im 19/55  brain]
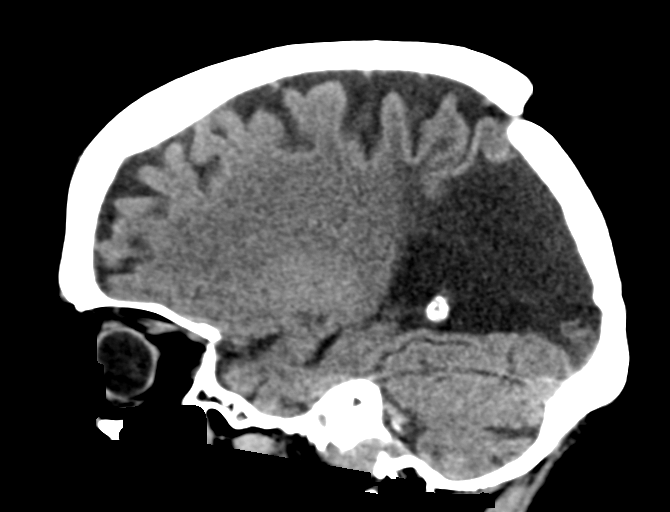
[im 27/55  brain]
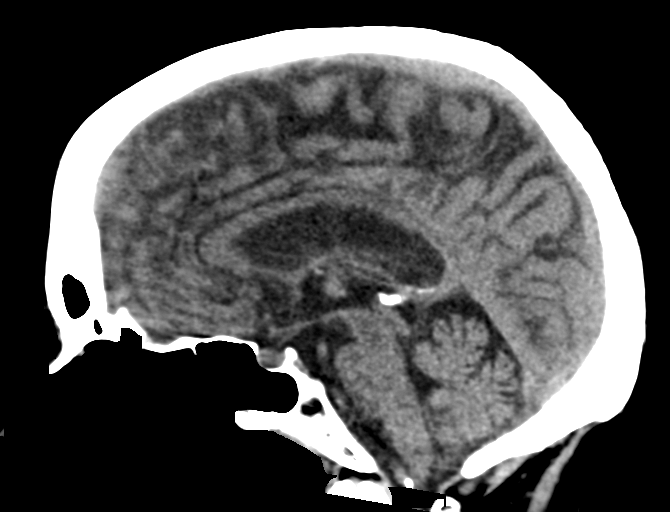
[im 35/55  brain]
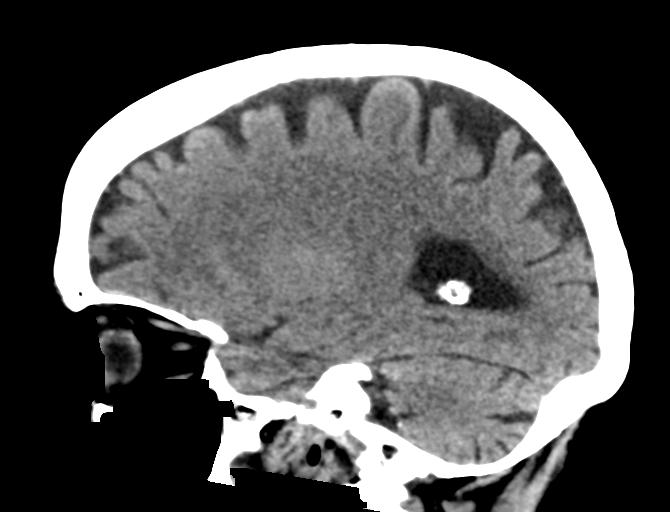

[15 of 47 positions shown; findings below may reference images not displayed]

FINDINGS: Brain: Stable large area of parieto-occipital encephalomalacia with
porencephaly on the right. Stable ex vacuo enlargement of the
adjacent right lateral ventricle. Stable moderate enlargement of the
remainder of the ventricles with mild to moderate diffuse
enlargement of the subarachnoid spaces. STABLE MINIMAL WHITE No
intracranial hemorrhage, mass lesion or CT evidence of acute
infarction.

Vascular: No hyperdense vessel or unexpected calcification.

Skull: Stable right posterior parietal post craniotomy changes. No
acute fracture seen.

Sinuses/Orbits: Unremarkable.

Other: None.
IMPRESSION: 1. No acute abnormality.
2. Stable large area of parieto-occipital encephalomalacia with
porencephaly on the right.
3. Stable mild-to-moderate diffuse cerebral and cerebellar atrophy.

## 2022-10-03 ENCOUNTER — Encounter: Payer: Self-pay | Admitting: Neurology

## 2022-10-03 ENCOUNTER — Telehealth: Payer: Self-pay | Admitting: Pharmacy Technician

## 2022-10-03 ENCOUNTER — Other Ambulatory Visit (HOSPITAL_COMMUNITY): Payer: Self-pay

## 2022-10-03 NOTE — Telephone Encounter (Signed)
Patient Advocate Encounter  Received notification from [employer] that prior authorization for VIMPAT is required.   PA submitted on 3.1.24 Key [id number] Status is pending

## 2022-10-03 NOTE — Telephone Encounter (Signed)
Patients daughter called in, patient is completely out of medication. Needing assistance.

## 2022-10-07 ENCOUNTER — Other Ambulatory Visit: Payer: Self-pay

## 2022-10-07 ENCOUNTER — Encounter (HOSPITAL_COMMUNITY): Payer: Self-pay

## 2022-10-07 ENCOUNTER — Emergency Department (HOSPITAL_COMMUNITY): Payer: Medicare Other

## 2022-10-07 ENCOUNTER — Emergency Department (HOSPITAL_COMMUNITY)
Admission: EM | Admit: 2022-10-07 | Discharge: 2022-10-08 | Disposition: A | Discharge status: Home / Self Care | Payer: Medicare Other | Attending: Emergency Medicine | Admitting: Emergency Medicine

## 2022-10-07 ENCOUNTER — Telehealth: Payer: Self-pay | Admitting: Neurology

## 2022-10-07 DIAGNOSIS — R0600 Dyspnea, unspecified: Secondary | ICD-10-CM | POA: Insufficient documentation

## 2022-10-07 DIAGNOSIS — E876 Hypokalemia: Secondary | ICD-10-CM | POA: Insufficient documentation

## 2022-10-07 DIAGNOSIS — R251 Tremor, unspecified: Secondary | ICD-10-CM | POA: Diagnosis not present

## 2022-10-07 DIAGNOSIS — J449 Chronic obstructive pulmonary disease, unspecified: Secondary | ICD-10-CM | POA: Diagnosis not present

## 2022-10-07 DIAGNOSIS — Z87891 Personal history of nicotine dependence: Secondary | ICD-10-CM | POA: Diagnosis not present

## 2022-10-07 DIAGNOSIS — Z20822 Contact with and (suspected) exposure to covid-19: Secondary | ICD-10-CM | POA: Diagnosis not present

## 2022-10-07 LAB — CBC
HCT: 38 % (ref 36.0–46.0)
Hemoglobin: 12.9 g/dL (ref 12.0–15.0)
MCH: 34.5 pg — ABNORMAL HIGH (ref 26.0–34.0)
MCHC: 33.9 g/dL (ref 30.0–36.0)
MCV: 101.6 fL — ABNORMAL HIGH (ref 80.0–100.0)
Platelets: 136 10*3/uL — ABNORMAL LOW (ref 150–400)
RBC: 3.74 MIL/uL — ABNORMAL LOW (ref 3.87–5.11)
RDW: 12.6 % (ref 11.5–15.5)
WBC: 4.3 10*3/uL (ref 4.0–10.5)
nRBC: 0 % (ref 0.0–0.2)

## 2022-10-07 LAB — COMPREHENSIVE METABOLIC PANEL
ALT: 12 U/L (ref 0–44)
AST: 17 U/L (ref 15–41)
Albumin: 3.7 g/dL (ref 3.5–5.0)
Alkaline Phosphatase: 65 U/L (ref 38–126)
Anion gap: 13 (ref 5–15)
BUN: 11 mg/dL (ref 8–23)
CO2: 22 mmol/L (ref 22–32)
Calcium: 9.4 mg/dL (ref 8.9–10.3)
Chloride: 107 mmol/L (ref 98–111)
Creatinine, Ser: 0.9 mg/dL (ref 0.44–1.00)
GFR, Estimated: >60 mL/min (ref >60)
Glucose, Bld: 103 mg/dL — ABNORMAL HIGH (ref 70–99)
Potassium: 3.1 mmol/L — ABNORMAL LOW (ref 3.5–5.1)
Sodium: 142 mmol/L (ref 135–145)
Total Bilirubin: 0.2 mg/dL — ABNORMAL LOW (ref 0.3–1.2)
Total Protein: 6.4 g/dL — ABNORMAL LOW (ref 6.5–8.1)

## 2022-10-07 LAB — RESP PANEL BY RT-PCR (RSV, FLU A&B, COVID)  RVPGX2
Influenza A by PCR: NEGATIVE
Influenza B by PCR: NEGATIVE
Resp Syncytial Virus by PCR: NEGATIVE
SARS Coronavirus 2 by RT PCR: NEGATIVE

## 2022-10-07 LAB — D-DIMER, QUANTITATIVE: D-Dimer, Quant: <0.27 ug/mL-FEU (ref 0.00–0.50)

## 2022-10-07 LAB — PHENYTOIN LEVEL, TOTAL: Phenytoin Lvl: 12.9 ug/mL (ref 10.0–20.0)

## 2022-10-07 LAB — BRAIN NATRIURETIC PEPTIDE: B Natriuretic Peptide: 41.6 pg/mL (ref 0.0–100.0)

## 2022-10-07 MED ORDER — POTASSIUM CHLORIDE CRYS ER 20 MEQ PO TBCR
40.0000 meq | EXTENDED_RELEASE_TABLET | Freq: Once | ORAL | Status: AC
  Administered 2022-10-07: 40 meq via ORAL
  Filled 2022-10-07: qty 2

## 2022-10-07 NOTE — Telephone Encounter (Signed)
Pt was able to get a 30 supply of vimpat. She is having side effects on xcorpi she is unsteady? Daughter said she was like this before the medication but she is tired of arguing with [employer] about it ,

## 2022-10-07 NOTE — ED Triage Notes (Signed)
Per EMS pt has been taking xcopri with tremors, falls, SOB, and dizziness. Pt reports being unable to walk and increased SOB.   EMS 126/70 HR 108 96% RA RR 22

## 2022-10-07 NOTE — ED Provider Notes (Signed)
[facility] Provider Note   CSN: [id number] Arrival date & time: 10/07/22  2121     History  Chief complaint: Shortness of breath tremors  [name] is a 67 y.o. female.  HPI   Patient states she has been having issues with tremors for a long period of time.  Patient also has history of seizures and has been on several medications.  Patient states she started taking Xcopri about a year ago.  She thinks this has been causing difficulties for her ever since.  Patient states she thinks she may have mentioned it to her neurologist but has not seen them recently.  Patient feels like the tremors are now making it more difficult for her to walk.  Patient also is complaining of feeling short of breath.  Patient does have a history of COPD but she stopped smoking long time ago.  She feels like she gets short of breath with activity.  She denies any chest pain.  No known fevers.  Home Medications Prior to Admission medications   Medication Sig Start Date End Date Taking? Authorizing Provider  acetaminophen (TYLENOL) 325 MG tablet Take 650 mg by mouth daily as needed for mild pain or moderate pain.    [provider]  Cenobamate (XCOPRI) 100 MG TABS Take 1 tablet (100 mg total) by mouth at bedtime. 08/25/22   Shellia Carwin, MD  DILANTIN 100 MG ER capsule TAKE 1 CAPSULE BY MOUTH TWICE DAILY Patient taking differently: Take 100 mg by mouth 2 (two) times daily. 08/25/22   Shellia Carwin, MD  DILANTIN 30 MG ER capsule TAKE 1 CAPSULE BY MOUTH NIGHTLY ALONG WITH 100 MG CAPSULE FOR A TOTAL NIGHTLY DOSE OF '130MG'$  Patient taking differently: Take 30 mg by mouth daily. 08/25/22   Shellia Carwin, MD  folic acid (FOLVITE) 1 MG tablet Take 1 mg by mouth daily.    [provider]  gabapentin (NEURONTIN) 300 MG capsule Take 2 capsule every night Patient taking differently: Take 300 mg by mouth at bedtime. 05/20/22   [name], MD  LORazepam  (ATIVAN) 1 MG tablet TAKE 1 TABLET BY MOUTH AS DIRECTED AS NEEDED FOR 3 OR MORE SEIZURES IN 24 HOURS. DO NOT TAKE MORE THAN 2 TABLETS IN 24 HOURS. Patient not taking: Reported on 08/25/2022 08/21/22   [name], MD  oxybutynin (DITROPAN-XL) 5 MG 24 hr tablet Take 1 tablet (5 mg total) by mouth at bedtime. Patient not taking: Reported on 08/25/2022 08/06/22   Eugenie Filler, MD  VIMPAT 200 MG TABS tablet Take 1 tablet (200 mg total) by mouth 2 (two) times daily. 05/20/22   [name], MD      Allergies    Zonegran [zonisamide], Chocolate, Codeine, Keppra [levetiracetam], Lyrica [pregabalin], and Olive oil    Review of Systems   Review of Systems  Physical Exam Updated Vital Signs BP (!) 123/58   Pulse 75   Temp 98 F (36.7 C) (Oral)   Resp 20   Ht 1.702 m ('5\' 7"'$ )   Wt 61.2 kg   SpO2 94%   BMI 21.14 kg/m  Physical Exam Vitals and nursing note reviewed.  Constitutional:      General: She is not in acute distress.    Appearance: She is well-developed.  HENT:     Head: Normocephalic and atraumatic.     Right Ear: External ear normal.     Left Ear: External ear  normal.  Eyes:     General: No scleral icterus.       Right eye: No discharge.        Left eye: No discharge.     Conjunctiva/sclera: Conjunctivae normal.  Neck:     Trachea: No tracheal deviation.  Cardiovascular:     Rate and Rhythm: Normal rate and regular rhythm.  Pulmonary:     Effort: Pulmonary effort is normal. No respiratory distress.     Breath sounds: Normal breath sounds. No stridor. No wheezing or rales.  Abdominal:     General: Bowel sounds are normal. There is no distension.     Palpations: Abdomen is soft.     Tenderness: There is no abdominal tenderness. There is no guarding or rebound.  Musculoskeletal:        General: No tenderness or deformity.     Cervical back: Neck supple.  Skin:    General: Skin is warm and dry.     Findings: No rash.  Neurological:     General: No focal  deficit present.     Mental Status: She is alert.     Cranial Nerves: No cranial nerve deficit, dysarthria or facial asymmetry.     Sensory: No sensory deficit.     Motor: Tremor present. No weakness, abnormal muscle tone or seizure activity.     Coordination: Coordination normal.  Psychiatric:        Mood and Affect: Mood normal.     ED Results / Procedures / Treatments   Labs (all labs ordered are listed, but only abnormal results are displayed) Labs Reviewed  CBC - Abnormal; Notable for the following components:      Result Value   RBC 3.74 (*)    MCV 101.6 (*)    MCH 34.5 (*)    Platelets 136 (*)    All other components within normal limits  COMPREHENSIVE METABOLIC PANEL - Abnormal; Notable for the following components:   Potassium 3.1 (*)    Glucose, Bld 103 (*)    Total Protein 6.4 (*)    Total Bilirubin 0.2 (*)    All other components within normal limits  RESP PANEL BY RT-PCR (RSV, FLU A&B, COVID)  RVPGX2  D-DIMER, QUANTITATIVE  BRAIN NATRIURETIC PEPTIDE  PHENYTOIN LEVEL, TOTAL    EKG EKG Interpretation  Date/Time:  Tuesday October 07 2022 22:00:03 EST Ventricular Rate:  85 PR Interval:  163 QRS Duration: 88 QT Interval:  382 QTC Calculation: 452 R Axis:   11 Text Interpretation: Sinus rhythm Nonspecific T abnrm, anterolateral leads No significant change since last tracing Confirmed by Dorie Rank [phone]) on 10/07/2022 10:06:15 PM  Radiology DG Chest 2 View  Result Date: 10/07/2022 CLINICAL DATA:  Cough EXAM: CHEST - 2 VIEW COMPARISON:  07/31/2022 FINDINGS: The heart size and mediastinal contours are within normal limits. Both lungs are clear. The visualized skeletal structures are unremarkable. IMPRESSION: No active cardiopulmonary disease. Electronically Signed   By: Inez Catalina M.D.   On: 10/07/2022 23:24    Procedures Procedures    Medications Ordered in ED Medications  potassium chloride SA (KLOR-CON M) CR tablet 40 mEq (has no administration in time  range)    ED Course/ Medical Decision Making/ A&P Clinical Course as of 10/07/22 2352  Tue Oct 07, 2022  2324 D-dimer, quantitative D-dimer normal.  Phenytoin level therapeutic range.  CBC normal.  Metabolic panel normal. [JK]    Clinical Course User Index [JK] Dorie Rank, MD  Medical Decision Making Problems Addressed: Dyspnea, unspecified type: chronic illness or injury with exacerbation, progression, or side effects of treatment Tremor: chronic illness or injury with exacerbation, progression, or side effects of treatment  Amount and/or Complexity of Data Reviewed Labs: ordered. Decision-making details documented in ED Course. Radiology: ordered.   Patient presented to ED with complaints of tremulousness and feeling short of breath.  Patient without any signs of seizure activity.  Tremor noted on exam.   Labs without signs of dehydration or electrolyte abnormality other than hypokalemia  COVID flu RSV negative.  D-dimer negative.  Phenytoin level therapeutic.  Patient feels her tremors are related to her medications.  Will have her contact her neurologist to discuss her medication regimen  No evidence of pneumonia or CHF on x-ray.  Patient is not having any breathing difficulty and does not have an oxygen requirement.  Doubt cardiac etiology or pulmonary embolism.  Evaluation and diagnostic testing in the emergency department does not suggest an emergent condition requiring admission or immediate intervention beyond what has been performed at this time.  The patient is safe for discharge and has been instructed to return immediately for worsening symptoms, change in symptoms or any other concerns.          Final Clinical Impression(s) / ED Diagnoses Final diagnoses:  Tremor  Dyspnea, unspecified type    Rx / DC Orders ED Discharge Orders     None         Dorie Rank, MD 10/07/22 2352

## 2022-10-07 NOTE — Telephone Encounter (Signed)
Pt daughter calling for an update

## 2022-10-07 NOTE — Telephone Encounter (Signed)
Pls ask daughter to monitor unsteadiness, the combination of Xcopri with Vimpat and Dilantin can cause more dizziness and unsteadiness, and if that is the case, we will need to reduce Vimpat dose. Pls have her monitor and if she is noticing it consistently as well, call us. Thanks

## 2022-10-07 NOTE — ED Notes (Signed)
Pt is on a 5 lead and was given a warm blanket at this time, call bell is within reach

## 2022-10-07 NOTE — Telephone Encounter (Signed)
Pt said she needs a call back to discuss her meds. Looks like a call was previously put in or a [employer]

## 2022-10-08 ENCOUNTER — Emergency Department (HOSPITAL_COMMUNITY): Payer: Medicare Other

## 2022-10-08 MED ORDER — PHENYTOIN SODIUM EXTENDED 100 MG PO CAPS
100.0000 mg | ORAL_CAPSULE | Freq: Two times a day (BID) | ORAL | Status: DC
  Administered 2022-10-08: 100 mg via ORAL
  Filled 2022-10-08: qty 1

## 2022-10-08 MED ORDER — PHENYTOIN SODIUM EXTENDED 30 MG PO CAPS
30.0000 mg | ORAL_CAPSULE | Freq: Every day | ORAL | Status: DC
  Administered 2022-10-08: 30 mg via ORAL
  Filled 2022-10-08: qty 1

## 2022-10-08 MED ORDER — LACOSAMIDE 50 MG PO TABS
200.0000 mg | ORAL_TABLET | Freq: Two times a day (BID) | ORAL | Status: DC
  Administered 2022-10-08: 200 mg via ORAL
  Filled 2022-10-08: qty 4

## 2022-10-08 NOTE — ED Provider Notes (Signed)
Care of patient assumed from Dr. Tomi Bamberger.  This patient presents for multiple complaints.  Among these are tremulousness, generalized weakness, shortness of breath.  She attributes the symptoms to her antiseizure medication.  She is currently awaiting CT head.  If CT is negative, she can be discharged home with neurology follow-up. Physical Exam  BP 131/63   Pulse 75   Temp 98 F (36.7 C) (Oral)   Resp (!) 22   Ht '5\' 7"'$  (1.702 m)   Wt 61.2 kg   SpO2 96%   BMI 21.14 kg/m   Physical Exam Vitals and nursing note reviewed.  Constitutional:      General: She is not in acute distress.    Appearance: She is well-developed. She is not ill-appearing, toxic-appearing or diaphoretic.  HENT:     Head: Normocephalic and atraumatic.     Right Ear: External ear normal.     Left Ear: External ear normal.     Mouth/Throat:     Mouth: Mucous membranes are moist.  Eyes:     Extraocular Movements: Extraocular movements intact.     Conjunctiva/sclera: Conjunctivae normal.  Cardiovascular:     Rate and Rhythm: Normal rate and regular rhythm.     Heart sounds: No murmur heard. Pulmonary:     Effort: Pulmonary effort is normal. No respiratory distress.     Breath sounds: Normal breath sounds. No wheezing, rhonchi or rales.  Abdominal:     General: There is no distension.     Palpations: Abdomen is soft.     Tenderness: There is no abdominal tenderness.  Musculoskeletal:        General: No swelling. Normal range of motion.     Cervical back: Normal range of motion and neck supple.     Right lower leg: No edema.     Left lower leg: No edema.  Skin:    General: Skin is warm and dry.     Coloration: Skin is not jaundiced or pale.  Neurological:     Mental Status: She is alert.     Cranial Nerves: Cranial nerves 2-12 are intact.     Sensory: Sensation is intact.     Motor: Tremor present. No weakness or abnormal muscle tone.     Coordination: Coordination is intact.  Psychiatric:        Mood and  Affect: Mood normal.        Behavior: Behavior normal.     Procedures  Procedures  ED Course / MDM   Clinical Course as of 10/08/22 0011  Tue Oct 07, 2022  2324 D-dimer, quantitative D-dimer normal.  Phenytoin level therapeutic range.  CBC normal.  Metabolic panel normal. [JK]    Clinical Course User Index [JK] Dorie Rank, MD   Medical Decision Making Amount and/or Complexity of Data Reviewed Labs: ordered. Decision-making details documented in ED Course. Radiology: ordered.  Risk Prescription drug management.   On assessment, patient appears to be resting comfortably in bed.  She does have ongoing tremulous movements, which she states is chronic.  Lungs remain clear to auscultation.  Despite this, patient does endorse some shortness of breath.  SpO2 is normal on room air.  Her breathing does not appear labored.  She is able to speak in complete sentences.  A CTA of chest was ordered.  During the scan, she did have IV infiltration of contrast.  No CTA of chest was obtained.  She did undergo CT of head which did not show any acute findings.  Given her normal D-dimer and normal chest x-ray, there is low utility of CTA.  Will defer this exam.  Patient was advised to discuss home medications with her neurologist.  Nighttime Dilantin and Vimpat were ordered.  She was discharged in stable condition.       Godfrey Pick, MD 10/08/22 [phone]

## 2022-10-08 NOTE — ED Notes (Signed)
Pt's IV infiltrated in CT. IV removed. Warm compress applied. MD notified.

## 2022-10-08 NOTE — Discharge Instructions (Signed)
Follow-up with [name] to discuss your medications

## 2022-10-08 NOTE — Telephone Encounter (Signed)
Pt daughter will monitor an see how pt is doing and let us know. She wanted to let Dr Delice Lesch know that yesterday Jalexis called EMS for her symptoms because she isn't listening to her daughter she has been on [employer] looking up her medication and the ide effects of them

## 2022-10-10 ENCOUNTER — Other Ambulatory Visit: Payer: Self-pay | Admitting: Neurology

## 2022-10-24 NOTE — Telephone Encounter (Signed)
Patient Advocate Encounter  Prior Authorization for VIMPAT 200MG  has been approved.    PA# [id number] Effective dates: 3.1.24 through 12.31.24

## 2022-10-30 ENCOUNTER — Other Ambulatory Visit: Payer: Self-pay

## 2022-10-30 MED ORDER — VIMPAT 200 MG PO TABS: 200.0000 mg | ORAL_TABLET | Freq: Two times a day (BID) | ORAL | 5 refills | Status: DC

## 2022-11-04 ENCOUNTER — Emergency Department (HOSPITAL_COMMUNITY): Payer: Medicare Other

## 2022-11-04 ENCOUNTER — Other Ambulatory Visit: Payer: Self-pay

## 2022-11-04 ENCOUNTER — Emergency Department (HOSPITAL_COMMUNITY)
Admission: EM | Admit: 2022-11-04 | Discharge: 2022-11-04 | Disposition: A | Discharge status: Home / Self Care | Payer: Medicare Other | Attending: Emergency Medicine | Admitting: Emergency Medicine

## 2022-11-04 ENCOUNTER — Encounter (HOSPITAL_COMMUNITY): Payer: Self-pay

## 2022-11-04 DIAGNOSIS — M79605 Pain in left leg: Secondary | ICD-10-CM | POA: Diagnosis not present

## 2022-11-04 DIAGNOSIS — Z91148 Patient's other noncompliance with medication regimen for other reason: Secondary | ICD-10-CM | POA: Insufficient documentation

## 2022-11-04 DIAGNOSIS — J45909 Unspecified asthma, uncomplicated: Secondary | ICD-10-CM | POA: Diagnosis not present

## 2022-11-04 DIAGNOSIS — R569 Unspecified convulsions: Secondary | ICD-10-CM | POA: Insufficient documentation

## 2022-11-04 DIAGNOSIS — W01198A Fall on same level from slipping, tripping and stumbling with subsequent striking against other object, initial encounter: Secondary | ICD-10-CM | POA: Diagnosis not present

## 2022-11-04 DIAGNOSIS — S0101XA Laceration without foreign body of scalp, initial encounter: Secondary | ICD-10-CM | POA: Insufficient documentation

## 2022-11-04 LAB — URINALYSIS, ROUTINE W REFLEX MICROSCOPIC
Bilirubin Urine: NEGATIVE
Glucose, UA: NEGATIVE mg/dL
Ketones, ur: NEGATIVE mg/dL
Nitrite: NEGATIVE
Protein, ur: 100 mg/dL — AB
Specific Gravity, Urine: 1.026 (ref 1.005–1.030)
WBC, UA: >50 WBC/hpf (ref 0–5)
pH: 5 (ref 5.0–8.0)

## 2022-11-04 LAB — CBC
HCT: 41.1 % (ref 36.0–46.0)
Hemoglobin: 13.4 g/dL (ref 12.0–15.0)
MCH: 33.7 pg (ref 26.0–34.0)
MCHC: 32.6 g/dL (ref 30.0–36.0)
MCV: 103.3 fL — ABNORMAL HIGH (ref 80.0–100.0)
Platelets: 124 10*3/uL — ABNORMAL LOW (ref 150–400)
RBC: 3.98 MIL/uL (ref 3.87–5.11)
RDW: 12.2 % (ref 11.5–15.5)
WBC: 5.3 10*3/uL (ref 4.0–10.5)
nRBC: 0 % (ref 0.0–0.2)

## 2022-11-04 LAB — BASIC METABOLIC PANEL
Anion gap: 11 (ref 5–15)
BUN: 11 mg/dL (ref 8–23)
CO2: 25 mmol/L (ref 22–32)
Calcium: 9.3 mg/dL (ref 8.9–10.3)
Chloride: 102 mmol/L (ref 98–111)
Creatinine, Ser: 1 mg/dL (ref 0.44–1.00)
GFR, Estimated: >60 mL/min (ref >60)
Glucose, Bld: 108 mg/dL — ABNORMAL HIGH (ref 70–99)
Potassium: 3.7 mmol/L (ref 3.5–5.1)
Sodium: 138 mmol/L (ref 135–145)

## 2022-11-04 LAB — PHENYTOIN LEVEL, TOTAL: Phenytoin Lvl: 8.5 ug/mL — ABNORMAL LOW (ref 10.0–20.0)

## 2022-11-04 LAB — CBG MONITORING, ED: Glucose-Capillary: 104 mg/dL — ABNORMAL HIGH (ref 70–99)

## 2022-11-04 MED ORDER — CEPHALEXIN 500 MG PO CAPS: 500.0000 mg | ORAL_CAPSULE | Freq: Two times a day (BID) | ORAL | 0 refills | Status: AC

## 2022-11-04 MED ORDER — SODIUM CHLORIDE 0.9 % IV SOLN
200.0000 mg | Freq: Once | INTRAVENOUS | Status: AC
  Administered 2022-11-04: 200 mg via INTRAVENOUS
  Filled 2022-11-04: qty 20

## 2022-11-04 MED ORDER — ACETAMINOPHEN 500 MG PO TABS
1000.0000 mg | ORAL_TABLET | Freq: Once | ORAL | Status: AC
  Administered 2022-11-04: 1000 mg via ORAL
  Filled 2022-11-04: qty 2

## 2022-11-04 NOTE — ED Triage Notes (Signed)
Pt BIB EMS due to seizures. Pt has hx of brain bleed in 1990's. Pt had multiple seizures today, compliant with medicine. Pt is axox4. Hx of seizures. Pt has head laceration on back of head. C/O ledft leg pain

## 2022-11-04 NOTE — Care Management (Signed)
ED RNCM met with patient at bedside patient reports  having run out of  Vimpat.  Reviewed chart patient is insured, patient provided a  text  message from pharmacy stating that prior Josem Kaufmann was approved.  Patient states she has not called the pharmacy about ETA of medication delivery.  RNCM suggested that she contacts them in am concerning her delivery of  her medication, patient verbalized understanding.  Updated  Roemhildt, Lorin PA-C

## 2022-11-04 NOTE — ED Notes (Signed)
Patient transported to CT 

## 2022-11-04 NOTE — ED Provider Notes (Signed)
[name]   CSN: [id number] Arrival date & time: 11/04/22  1826     History  Chief Complaint  Patient presents with   Seizures    [name] is a 67 y.o. female with history of chronic thrombocytopenia, seizure disorder on lacosamide and phenytoin, degenerative disc disease, DVT s/p IVC filter, asthma, COPD, cerebral hemorrhage in 1989 who presents to the emergency department complaining of seizure with head trauma.  Patient states that for the past few days she has been out of her vimpat due to an issue with her insurance. Had two seizures today, one earlier this morning, and one this evening.  With the second seizure she reports falling backwards and striking her head on a serving table.  No other trauma noted, over she is complaining about pain in her left hip and left thigh.   Seizures      Home Medications Prior to Admission medications   Medication Sig Start Date End Date Taking? Authorizing Provider  acetaminophen (TYLENOL) 325 MG tablet Take 650 mg by mouth daily as needed for mild pain or moderate pain.    [provider]  Cenobamate (XCOPRI) 100 MG TABS Take 1 tablet (100 mg total) by mouth at bedtime. 08/25/22   Shellia Carwin, MD  DILANTIN 100 MG ER capsule TAKE 1 CAPSULE BY MOUTH TWICE DAILY Patient taking differently: Take 100 mg by mouth 2 (two) times daily. 08/25/22   Shellia Carwin, MD  DILANTIN 30 MG ER capsule TAKE 1 CAPSULE BY MOUTH NIGHTLY ALONG WITH 100 MG CAPSULE FOR A TOTAL NIGHTLY DOSE OF 130MG  Patient taking differently: Take 30 mg by mouth daily. 08/25/22   Shellia Carwin, MD  folic acid (FOLVITE) 1 MG tablet Take 1 mg by mouth daily.    [provider]  gabapentin (NEURONTIN) 300 MG capsule Take 2 capsule every night Patient taking differently: Take 300 mg by mouth at bedtime. 05/20/22   [name], MD  LORazepam (ATIVAN) 1 MG tablet TAKE 1 TABLET BY MOUTH AS DIRECTED AS  NEEDED FOR 3 OR MORE SEIZURES IN 24 HOURS. DO NOT TAKE MORE THAN 2 TABLETS IN 24 HOURS. 10/10/22   [name], MD  oxybutynin (DITROPAN-XL) 5 MG 24 hr tablet Take 1 tablet (5 mg total) by mouth at bedtime. Patient not taking: Reported on 08/25/2022 08/06/22   Eugenie Filler, MD  VIMPAT 200 MG TABS tablet Take 1 tablet (200 mg total) by mouth 2 (two) times daily. 10/30/22   Shellia Carwin, MD      Allergies    Zonegran [zonisamide], Chocolate, Codeine, Keppra [levetiracetam], Lyrica [pregabalin], and Olive oil    Review of Systems   Review of Systems  Musculoskeletal:  Positive for arthralgias.  Skin:  Positive for wound.  Neurological:  Positive for seizures.  All other systems reviewed and are negative.   Physical Exam Updated Vital Signs BP 126/89   Pulse 81   Temp 98.3 F (36.8 C) (Temporal)   Resp 14   Ht 5\' 7"  (1.702 m)   Wt 61.2 kg   SpO2 95%   BMI 21.14 kg/m  Physical Exam Vitals and nursing [name] reviewed.  Constitutional:      Appearance: Normal appearance.  HENT:     Head: Normocephalic.      Comments: Approximately 3 cm laceration noted to the crown of the head, small amount of bleeding Eyes:     Conjunctiva/sclera: Conjunctivae  normal.  Cardiovascular:     Rate and Rhythm: Normal rate and regular rhythm.     Pulses:          Posterior tibial pulses are 2+ on the right side and 2+ on the left side.  Pulmonary:     Effort: Pulmonary effort is normal. No respiratory distress.     Breath sounds: Normal breath sounds.  Abdominal:     General: There is no distension.     Palpations: Abdomen is soft.     Tenderness: There is no abdominal tenderness.  Musculoskeletal:     Right lower leg: No edema.     Left lower leg: No edema.     Comments: Some generalized tenderness to palpation of the left thigh, compartments are soft.  No appreciable edema.  Range of motion of bilateral hips and knees.  Skin:    General: Skin is warm and dry.  Neurological:      General: No focal deficit present.     Mental Status: She is alert.     Comments: Neuro: Speech is clear, able to follow commands. CN III-XII intact grossly intact. PERRLA. EOMI. Sensation intact throughout. Str 5/5 all extremities.     ED Results / Procedures / Treatments   Labs (all labs ordered are listed, but only abnormal results are displayed) Labs Reviewed  BASIC METABOLIC PANEL - Abnormal; Notable for the following components:      Result Value   Glucose, Bld 108 (*)    All other components within normal limits  PHENYTOIN LEVEL, TOTAL - Abnormal; Notable for the following components:   Phenytoin Lvl 8.5 (*)    All other components within normal limits  CBC - Abnormal; Notable for the following components:   MCV 103.3 (*)    Platelets 124 (*)    All other components within normal limits  CBG MONITORING, ED - Abnormal; Notable for the following components:   Glucose-Capillary 104 (*)    All other components within normal limits  URINALYSIS, ROUTINE W REFLEX MICROSCOPIC    EKG EKG Interpretation  Date/Time:  Tuesday November 04 2022 18:30:47 EDT Ventricular Rate:  67 PR Interval:  136 QRS Duration: 91 QT Interval:  375 QTC Calculation: 396 R Axis:   85 Text Interpretation: Sinus rhythm Consider left atrial enlargement Borderline right axis deviation No significant change since last tracing Confirmed by Leanord Asal ([id number]) on 11/04/2022 8:31:27 PM  Radiology CT Head Wo Contrast  Result Date: 11/04/2022 CLINICAL DATA:  Seizure. EXAM: CT HEAD WITHOUT CONTRAST TECHNIQUE: Contiguous axial images were obtained from the base of the skull through the vertex without intravenous contrast. RADIATION DOSE REDUCTION: This exam was performed according to the departmental dose-optimization program which includes automated exposure control, adjustment of the mA and/or kV according to patient size and/or use of iterative reconstruction technique. COMPARISON:  Head CT dated 10/08/2022.  FINDINGS: Brain: Large area of encephalomalacia in the right occipital lobe with associated ex vacuo dilatation of the occipital horn of the right lateral ventricle similar to prior CT. There is no acute intracranial hemorrhage. No mass effect or midline shift. No extra-axial fluid collection. Vascular: No hyperdense vessel or unexpected calcification. Skull: Right occipital craniotomy.  No acute calvarial pathology. Sinuses/Orbits: The visualized paranasal sinuses and the right mastoid air cells are clear. There is opacification of inferior left mastoid air cells. Other: Laceration of the scalp over the vertex. IMPRESSION: 1. No acute intracranial pathology. 2. Large area of encephalomalacia in the right occipital lobe  with associated ex vacuo dilatation of the occipital horn of the right lateral ventricle similar to prior CT. Electronically Signed   By: Anner Crete M.D.   On: 11/04/2022 21:46    Procedures .Marland KitchenLaceration Repair  Date/Time: 11/04/2022 10:56 PM  Performed by: Kateri Plummer, PA-C Authorized by: Kateri Plummer, PA-C   Consent:    Consent obtained:  Verbal   Consent given by:  Patient   Risks discussed:  Pain, poor cosmetic result and poor wound healing Universal protocol:    Patient identity confirmed:  Provided demographic data Anesthesia:    Anesthesia method:  None Laceration details:    Location:  Scalp   Scalp location:  Crown   Length (cm):  3   Depth (mm):  1 Pre-procedure details:    Preparation:  Patient was prepped and draped in usual sterile fashion and imaging obtained to evaluate for foreign bodies Exploration:    Limited defect created (wound extended): no     Hemostasis achieved with:  Direct pressure   Imaging obtained comment:  CT   Imaging outcome: foreign body not noted     Wound exploration: entire depth of wound visualized   Treatment:    Wound cleansed with: hydrogen peroxide.   Amount of cleaning:  Standard Skin repair:    Repair  method:  Staples   Number of staples:  4 Approximation:    Approximation:  Close Repair type:    Repair type:  Simple Post-procedure details:    Dressing:  Open (no dressing)   Procedure completion:  Tolerated well, no immediate complications     Medications Ordered in ED Medications  lacosamide (VIMPAT) 200 mg in sodium chloride 0.9 % 25 mL IVPB (0 mg Intravenous Stopped 11/04/22 2146)  acetaminophen (TYLENOL) tablet 1,000 mg (1,000 mg Oral Given 11/04/22 2237)    ED Course/ Medical Decision Making/ A&P                             Medical Decision Making Amount and/or Complexity of Data Reviewed Labs: ordered. Radiology: ordered.  Risk OTC drugs.  This patient is a 67 y.o. female  who presents to the ED for concern of seizure with head laceration.   Differential diagnoses prior to evaluation: The emergent differential diagnosis includes, but is not limited to,  Epilepsy, pseudoseizure, syncopal episode with seizure-like movements, electrolyte abnormalities, ETOH withdrawal, head trauma, sepsis, space occupying lesion. This is not an exhaustive differential.   Past Medical History / Co-morbidities: chronic thrombocytopenia, seizure disorder on lacosamide and phenytoin, degenerative disc disease, DVT s/p IVC filter, asthma, COPD, cerebral hemorrhage in 1989  Additional history: Chart reviewed. Pertinent results include: Pt normally on 200 mg vimpat BID and 130 mg dilantin daily  Physical Exam: Physical exam performed. The pertinent findings include: Normal vital signs, no distress. Laceration noted to the crown of the head. Normal neurologic exam, back to baseline. Pain to the muscles of left thigh, compartments soft, no obvious swelling, normal ROM of bilateral hips and knees.   Lab Tests/Imaging studies: I personally interpreted labs/imaging and the pertinent results include: No leukocytosis, normal hemoglobin.  BMP unremarkable.  Phenytoin level subtherapeutic.Marland Kitchen  Urinalysis  pending at time of discharge. CT head without acute abnormalities.  I agree with the radiologist interpretation.  Pt concerned about blood clot in her left leg due to pain. I have lower concern for this but with her history of prior clots and IVC filter in  place, will place order for outpatient [place] to have done tomorrow as it cannot be performed at this facility tonight.   Cardiac monitoring: EKG obtained and interpreted by my attending physician which shows: Sinus rhythm   Medications: I ordered medication including Tylenol for headache and Vimpat load.  I have reviewed the patients home medicines and have made adjustments as needed.  Consultations obtained: I consulted with transition of care team and social worker who were able to speak to the patient, and they advised her that her prior authorization was approved.  They recommended that she call the pharmacy first thing tomorrow to obtain her medications.   Disposition: After consideration of the diagnostic results and the patients response to treatment, I feel that emergency department workup does not suggest an emergent condition requiring admission or immediate intervention beyond what has been performed at this time. The plan is: Discharged home with instructions for wound care, and follow-up with PCP. Suspect seizure in the setting of medication noncompliance due to difficulty obtaining medication.  Pt to return for outpatient DVT study tomorrow, and order is placed.   At time of discharge patient mentioned she had been having some dysuria for a few days. Urinalysis collected and is pending. No evidence of urosepsis. Will send in prescription if UA positive.   The patient is safe for discharge and has been instructed to return immediately for worsening symptoms, change in symptoms or any other concerns.  Final Clinical Impression(s) / ED Diagnoses Final diagnoses:  Seizure  Laceration of scalp without foreign body, initial encounter   Left leg pain  Patient's other noncompliance with medication regimen for other reason    Rx / DC Orders ED Discharge Orders          Ordered    [facility]        11/04/22 2244           Portions of this report may have been transcribed using voice recognition software. Every effort was made to ensure accuracy; however, inadvertent computerized transcription errors may be present.    Taylin Leder T, PA-C 11/04/22 2304    Leanord Asal K, DO 11/04/22 2340

## 2022-11-04 NOTE — ED Triage Notes (Signed)
Pt BIB [facility]. CC: pt had a seizure and fell backwards, hitting her head, witnessed by daughter. Pt reporrts

## 2022-11-04 NOTE — Discharge Instructions (Signed)
You were seen in the emergency department for seizure and head laceration.   We have closed your laceration(s) with staples. These need to be removed in 7 days. This can be done at any doctor's office, urgent care, or emergency department.   If any of the staples come out before it is time for removal, that is okay. Make sure to keep the area as clean and dry as possible. You can let warm soapy warm run over the area, but do NOT scrub it.   Watch out for signs of infection, like we discussed, including: increased redness, tenderness, or drainage of pus from the area. If this happens and you have not been prescribed an antibiotic, please seek medical attention for possible infection.   You can take over the counter pain medicine like ibuprofen or tylenol as needed.   Please call your pharmacy first thing tomorrow morning to get your prescription for Vimpat.   I have scheduled you for an ultrasound tomorrow morning to evaluate your leg for a blood clot. Please go to [facility] at 11 am tomorrow and tell them you are there for a vascular study.

## 2022-11-04 NOTE — ED Notes (Signed)
Urine sample at bedside

## 2022-11-05 ENCOUNTER — Telehealth: Payer: Self-pay | Admitting: Neurology

## 2022-11-05 ENCOUNTER — Ambulatory Visit (HOSPITAL_COMMUNITY): Payer: Medicare Other

## 2022-11-05 NOTE — Telephone Encounter (Signed)
Patient is sch to see Dr Delice Lesch in May. Patient daughter called and states that patient is having seizure daily. She fell yesterday and hit her head, they took her to [facility]. Patient dilantin level is low it was 8.5 as of yesterday.  Please call patient daughter

## 2022-11-05 NOTE — Telephone Encounter (Signed)
Pt c/o: seizure Missed medications?  yes Sleep deprived?  no Alcohol intake?  no Increased stress? no Any change in medication color or shape? no Back to their usual baseline self?  yes Current medications prescribed by Dr. Aquino:vimpat 100mg  bid Diltantin 100mg  1 po bid Diltantin 30mg  1 tab po qd Hasn't been taken xycorpi 100mg  1 tab po qd,( due to sideeffects ) has no vimpat 100mg  not due to get from pharmacy till tomorrow. Note patient hit her head yesterday from her seizure, hit the side of a table, has 4 staples in head. Dilantin level is low 8.5 Also Has UTI. Please advise.

## 2022-11-05 NOTE — Telephone Encounter (Signed)
I advised per Dr.Jeremy Hill's advice, to take her meds as prescribed.

## 2022-11-18 ENCOUNTER — Other Ambulatory Visit: Payer: Self-pay | Admitting: *Deleted

## 2022-11-18 DIAGNOSIS — Z95828 Presence of other vascular implants and grafts: Secondary | ICD-10-CM

## 2022-11-18 DIAGNOSIS — I824Y9 Acute embolism and thrombosis of unspecified deep veins of unspecified proximal lower extremity: Secondary | ICD-10-CM

## 2022-11-23 IMAGING — DX DG CHEST 1V PORT
1 series · 1 of 1 positions shown · non-contrast
Comparison: August 02, 2020 chest radiograph and chest CT

CLINICAL DATA: Shortness of breath

EXAM:
PORTABLE CHEST 1 VIEW

[chest]
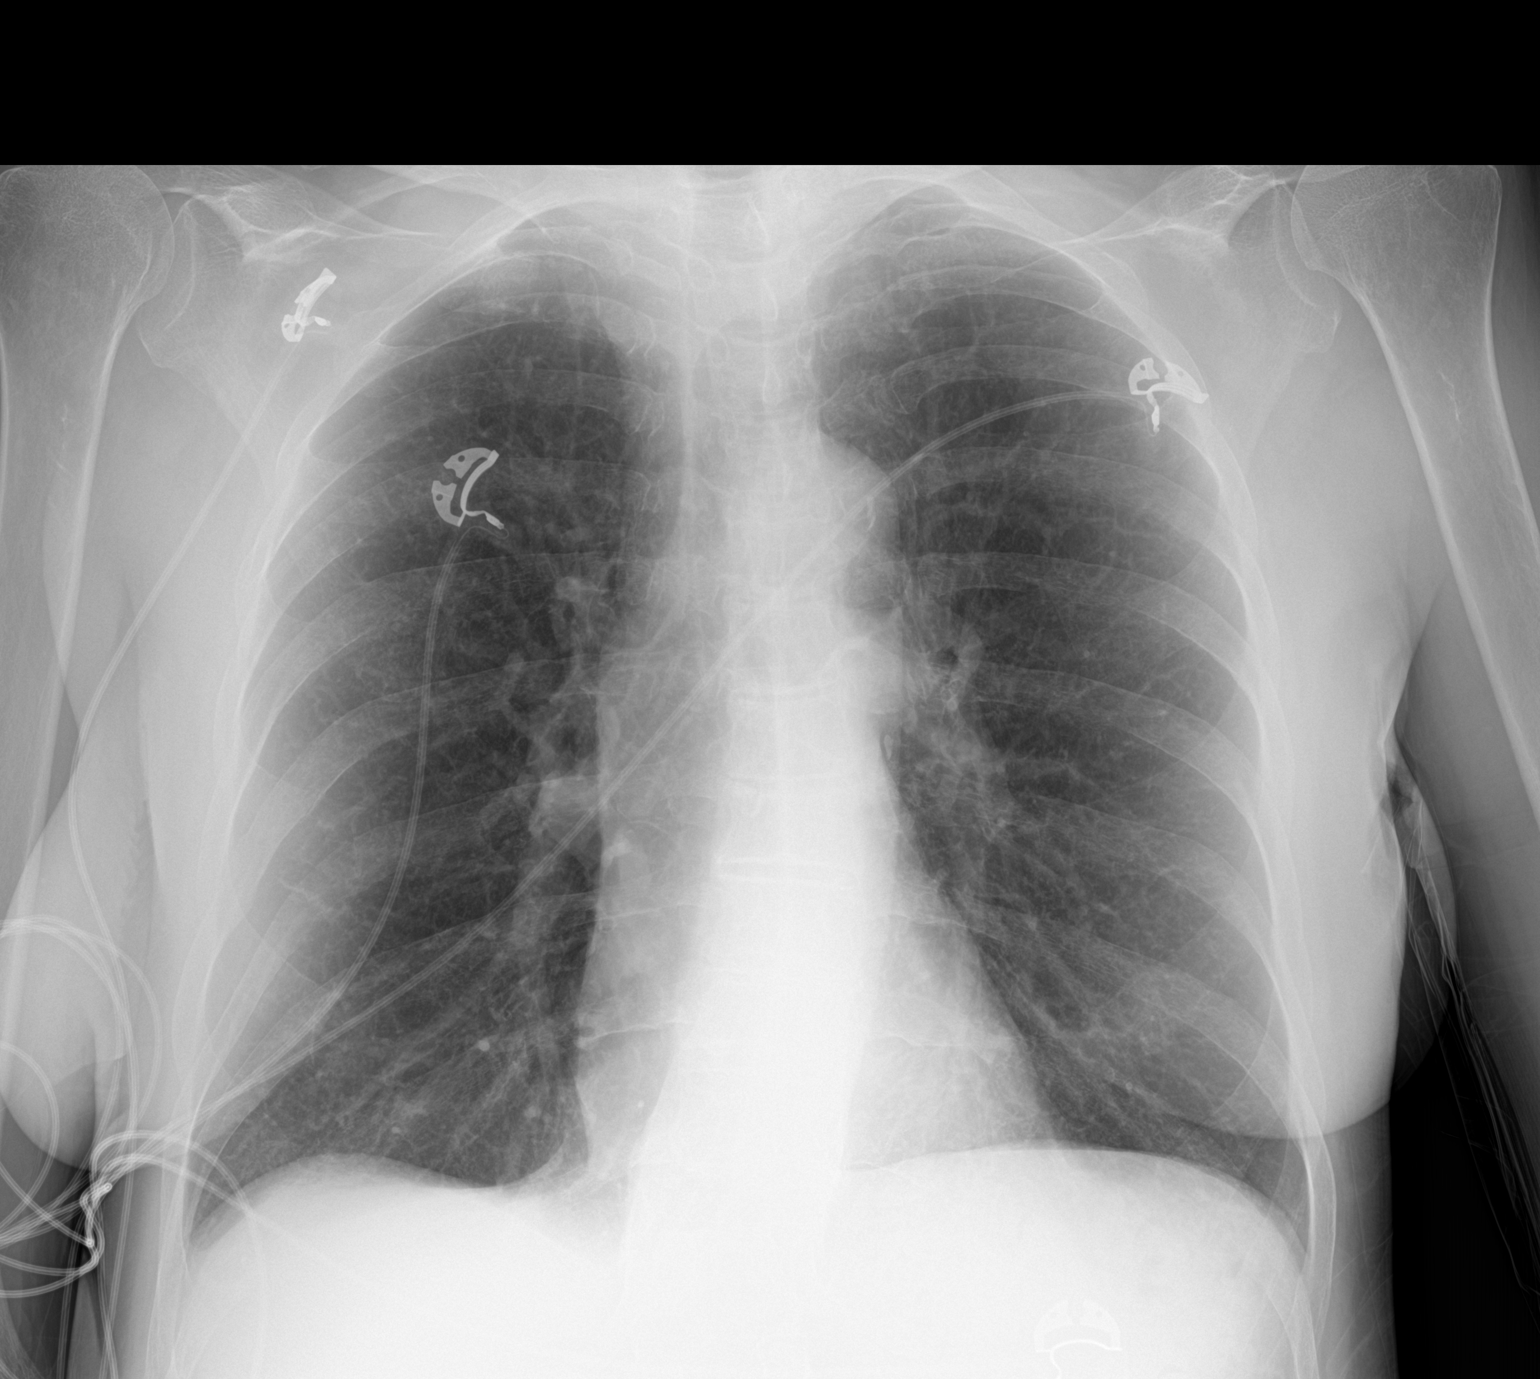

[1 of 1 positions shown; findings below may reference images not displayed]

FINDINGS: No edema or airspace opacity. Heart size and pulmonary vascularity
are normal. No adenopathy. There is aortic atherosclerosis. There is
an old healed fracture of the right clavicle with remodeling.
IMPRESSION: Lungs clear. Heart size normal. Aortic Atherosclerosis
([id number]).

## 2022-11-27 ENCOUNTER — Ambulatory Visit: Payer: Medicare Other | Admitting: Vascular Surgery

## 2022-11-27 ENCOUNTER — Ambulatory Visit (HOSPITAL_COMMUNITY)
Admission: RE | Admit: 2022-11-27 | Discharge: 2022-11-27 | Disposition: A | Discharge status: Home / Self Care | Payer: Medicare Other | Source: Ambulatory Visit | Attending: Vascular Surgery | Admitting: Vascular Surgery

## 2022-11-27 ENCOUNTER — Encounter: Payer: Self-pay | Admitting: Vascular Surgery

## 2022-11-27 VITALS — BP 107/66 | HR 68 | Temp 97.8°F | Resp 20 | Ht 67.0 in | Wt 131.0 lb

## 2022-11-27 DIAGNOSIS — I82402 Acute embolism and thrombosis of unspecified deep veins of left lower extremity: Secondary | ICD-10-CM

## 2022-11-27 DIAGNOSIS — I824Y9 Acute embolism and thrombosis of unspecified deep veins of unspecified proximal lower extremity: Secondary | ICD-10-CM | POA: Diagnosis not present

## 2022-11-27 DIAGNOSIS — Z95828 Presence of other vascular implants and grafts: Secondary | ICD-10-CM

## 2022-11-27 NOTE — Progress Notes (Signed)
REASON FOR VISIT:   Follow-up after placement of IVC filter and extensive left lower extremity DVT  MEDICAL ISSUES:   H/O IVC FILTER AND LEFT LOWER EXTREMITY DVT: This patient has a history of an extensive left lower extremity DVT and previous placement of an IVC filter on 10/12/2020.  She has a contraindication to heparin because of her intracranial bleed previously.  For this reason we elected to leave the filter.  She comes in for yearly follow-up visit.  There is no clot in the IVC.  She does have some symptoms of venous hypertension on the left and we have discussed the importance of exercise, avoiding prolonged sitting and standing, and leg elevation.  I explained that given that we are leaving the filter we do like to see her on a yearly basis.  I explained that I will be retiring so she will be seen by the PA in 1 year.  Will get a follow-up duplex of her IVC and left iliac system and also a plain abdominal x-ray at that time.  She knows to call sooner if she has problems.   HPI:   [name] is a pleasant 67 y.o. female who I last saw on 07/04/2021.  She had an extensive DVT of the left lower extremity in March 2022.  She had a contraindication to heparin because of an intracranial aneurysm and a history of frequent falls.  For this reason an IVC filter was placed and our plan was not to remove this.  The filter was placed on 10/12/2020.  Her duplex scan on 05/01/2021 showed no evidence of thrombus in the IVC.  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  Since I saw the patient last she denies significant leg swelling on the left where she had her DVT.  She does have some aching pain in the left which is worse with sitting and standing and relieved with elevation.  Thus I think she does have some symptoms from venous hypertension.  She denies any chest pain.  She does have shortness of breath related to her asthma and COPD.  Of note she quit smoking 5 years ago.  I  do not get any history of claudication or rest pain.  She does have a history of neuropathy.  Past Medical History:  Diagnosis Date   Asthma    Cerebral hemorrhage 1989   Chronic back pain    COPD (chronic obstructive pulmonary disease)    DDD (degenerative disc disease) 06/12/2013   DVT (deep venous thrombosis)    "she's had several since 1990"   GERD (gastroesophageal reflux disease)    Headache    "usually around time when she's had a seizure"   History of blood transfusion    "when she was a baby"   History of kidney stones    History of stomach ulcers    Neuropathy    Seizures    "because of her brain surgery"  last one 07/28/22   Stroke 1990's   family denies residual on 11/09/2014   Tunnel vision    "since brain OR"    Family History  Problem Relation Age of Onset   Heart attack Mother        4 MIs, started in her 33s, also vaginal cancer and colon cancer   Cancer Mother    Heart attack Father        Died suddenly age 53 - autopsy revealed heart attack her patient  Cancer Brother        Sinus cancer    SOCIAL HISTORY: Social History   Tobacco Use   Smoking status: Former    Packs/day: 0.50    Years: 20.00    Additional pack years: 0.00    Total pack years: 10.00    Types: Cigarettes    Quit date: 10/26/2017    Years since quitting: 5.0   Smokeless tobacco: Never   Tobacco comments:    Previously smoked 2 ppd, down to 4 cigs/day, patient quit smoking in 2019  Substance Use Topics   Alcohol use: No    Allergies  Allergen Reactions   Zonegran [Zonisamide] Other (See Comments)    Numbness and tingling over whole body   Chocolate Other (See Comments)    Triggers seizures   Codeine Nausea And Vomiting   Keppra [Levetiracetam] Other (See Comments)    Causes seizures   Lyrica [Pregabalin] Nausea And Vomiting   Olive Oil Rash    Current Outpatient Medications  Medication Sig Dispense Refill   acetaminophen (TYLENOL) 325 MG tablet Take 650 mg by  mouth daily as needed for mild pain or moderate pain.     Cenobamate (XCOPRI) 100 MG TABS Take 1 tablet (100 mg total) by mouth at bedtime. 90 tablet 3   DILANTIN 100 MG ER capsule TAKE 1 CAPSULE BY MOUTH TWICE DAILY (Patient taking differently: Take 100 mg by mouth 2 (two) times daily.) 60 capsule 10   DILANTIN 30 MG ER capsule TAKE 1 CAPSULE BY MOUTH NIGHTLY ALONG WITH 100 MG CAPSULE FOR A TOTAL NIGHTLY DOSE OF  (Patient taking differently: Take 30 mg by mouth daily.) 30 capsule 10   folic acid (FOLVITE) 1 MG tablet Take 1 mg by mouth daily.     gabapentin (NEURONTIN) 300 MG capsule Take 2 capsule every night (Patient taking differently: Take 300 mg by mouth at bedtime.) 180 capsule 3   LORazepam (ATIVAN) 1 MG tablet TAKE 1 TABLET BY MOUTH AS DIRECTED AS NEEDED FOR 3 OR MORE SEIZURES IN 24 HOURS. DO NOT TAKE MORE THAN 2 TABLETS IN 24 HOURS. 10 tablet 5   oxybutynin (DITROPAN-XL) 5 MG 24 hr tablet Take 1 tablet (5 mg total) by mouth at bedtime. 30 tablet 1   VIMPAT 200 MG TABS tablet Take 1 tablet (200 mg total) by mouth 2 (two) times daily. 60 tablet 5   No current facility-administered medications for this visit.    REVIEW OF SYSTEMS:   denotes positive finding,  denotes negative finding Cardiac  Comments:  Chest pain or chest pressure:    Shortness of breath upon exertion: x   Short of breath when lying flat: x   Irregular heart rhythm:        Vascular    Pain in calf, thigh, or hip brought on by ambulation:    Pain in feet at night that wakes you up from your sleep:     Blood clot in your veins:    Leg swelling:         Pulmonary    Oxygen at home:    Productive cough:     Wheezing:         Neurologic    Sudden weakness in arms or legs:     Sudden numbness in arms or legs:     Sudden onset of difficulty speaking or slurred speech:    Temporary loss of vision in one eye:     Problems with dizziness:  x       Gastrointestinal    Blood in stool:     Vomited  blood:         Genitourinary    Burning when urinating:     Blood in urine:        Psychiatric    Major depression:         Hematologic    Bleeding problems:    Problems with blood clotting too easily:        Skin    Rashes or ulcers:        Constitutional    Fever or chills:     PHYSICAL EXAM:   Vitals:   11/27/22 0829  BP: 107/66  Pulse: 68  Resp: 20  Temp: 97.8 F (36.6 C)  SpO2: 92%  Weight: 131 lb (59.4 kg)  Height: 5\' 7"  (1.702 m)    GENERAL: The patient is a well-nourished female, in no acute distress. The vital signs are documented above. CARDIAC: There is a regular rate and rhythm.  VASCULAR: I do not detect carotid bruits. She has palpable femoral pulses and palpable dorsalis pedis pulses bilaterally. She has no significant lower extremity swelling. She does have some hyperpigmentation bilaterally. PULMONARY: There is good air exchange bilaterally without wheezing or rales. ABDOMEN: Soft and non-tender with normal pitched bowel sounds.  MUSCULOSKELETAL: There are no major deformities or cyanosis. NEUROLOGIC: No focal weakness or paresthesias are detected. SKIN: There are no ulcers or rashes noted. PSYCHIATRIC: The patient has a normal affect.  DATA:    IVC DUPLEX: I have independently interpreted her duplex of the IVC.  There is no evidence of clot in the IVC.  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  Waverly [facility] [phone]

## 2022-12-01 ENCOUNTER — Other Ambulatory Visit (HOSPITAL_COMMUNITY)
Admission: RE | Admit: 2022-12-01 | Discharge: 2022-12-01 | Disposition: A | Discharge status: Home / Self Care | Payer: Medicare Other | Source: Ambulatory Visit | Attending: Family Medicine | Admitting: Family Medicine

## 2022-12-01 ENCOUNTER — Other Ambulatory Visit: Payer: Self-pay | Admitting: Family Medicine

## 2022-12-01 DIAGNOSIS — Z1151 Encounter for screening for human papillomavirus (HPV): Secondary | ICD-10-CM | POA: Insufficient documentation

## 2022-12-01 DIAGNOSIS — Z01419 Encounter for gynecological examination (general) (routine) without abnormal findings: Secondary | ICD-10-CM | POA: Insufficient documentation

## 2022-12-03 ENCOUNTER — Other Ambulatory Visit: Payer: Self-pay | Admitting: Family Medicine

## 2022-12-03 DIAGNOSIS — E2839 Other primary ovarian failure: Secondary | ICD-10-CM

## 2022-12-03 LAB — CYTOLOGY - PAP
Adequacy: ABSENT
Comment: NEGATIVE
Diagnosis: NEGATIVE
High risk HPV: NEGATIVE

## 2022-12-08 ENCOUNTER — Other Ambulatory Visit: Payer: Self-pay | Admitting: Family Medicine

## 2022-12-08 ENCOUNTER — Other Ambulatory Visit: Payer: Self-pay

## 2022-12-08 DIAGNOSIS — N6323 Unspecified lump in the left breast, lower outer quadrant: Secondary | ICD-10-CM

## 2022-12-08 DIAGNOSIS — N644 Mastodynia: Secondary | ICD-10-CM

## 2022-12-08 DIAGNOSIS — Z95828 Presence of other vascular implants and grafts: Secondary | ICD-10-CM

## 2022-12-09 ENCOUNTER — Other Ambulatory Visit: Payer: Self-pay | Admitting: Neurology

## 2022-12-10 ENCOUNTER — Encounter: Payer: Self-pay | Admitting: Neurology

## 2022-12-10 ENCOUNTER — Ambulatory Visit: Payer: Medicare Other | Admitting: Neurology

## 2022-12-10 VITALS — BP 128/62 | HR 72 | Ht 67.0 in | Wt 131.8 lb

## 2022-12-10 DIAGNOSIS — G629 Polyneuropathy, unspecified: Secondary | ICD-10-CM

## 2022-12-10 DIAGNOSIS — R2681 Unsteadiness on feet: Secondary | ICD-10-CM | POA: Diagnosis not present

## 2022-12-10 DIAGNOSIS — G40219 Localization-related (focal) (partial) symptomatic epilepsy and epileptic syndromes with complex partial seizures, intractable, without status epilepticus: Secondary | ICD-10-CM

## 2022-12-10 MED ORDER — PREGABALIN 50 MG PO CAPS: ORAL_CAPSULE | ORAL | 1 refills | Status: DC

## 2022-12-10 MED ORDER — VIMPAT 200 MG PO TABS: 200.0000 mg | ORAL_TABLET | Freq: Two times a day (BID) | ORAL | 5 refills | Status: DC

## 2022-12-10 MED ORDER — DILANTIN 100 MG PO CAPS: 100.0000 mg | ORAL_CAPSULE | Freq: Two times a day (BID) | ORAL | 3 refills | Status: DC

## 2022-12-10 MED ORDER — DILANTIN 30 MG PO CAPS: ORAL_CAPSULE | ORAL | 3 refills | Status: DC

## 2022-12-10 NOTE — Progress Notes (Signed)
NEUROLOGY FOLLOW UP OFFICE NOTE  [name] [id number] April 13, 1956  HISTORY OF PRESENT ILLNESS: I had the pleasure of seeing [name] in follow-up in the neurology clinic on 12/10/2022.  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 She is again accompanied by her daughter [name] who helps supplement the history today.  Records and images were personally reviewed where available. [name] contacted our office in March reporting patient was unsteady and attributed this to [name]. Informed them that the combination of [name] with either [name] or [name] could cause dizziness/unsteadiness, and dose of [name] could be reduced. She went to the ER reporting tremulousness, generalized weakness, shortness of breath, attributing these to [name]. CT head normal. Patient decided to stop the [name] all together. She was in the ER for a seizure on 11/05/22. She had run out of [name] due to insurance issues, she had 4 seizures that day, one of them causing a fall with head injury. She had 4 staples placed. [name] level was 8.5. She was also found to have a UTI. No further seizures since then. She reports that she has also stopped the [name] because it made her very drowsy 15 minutes after taking it. She also stopped the [name] since it was not helping her legs. She reports her legs and feet go numb when she is sitting, from the knees down. She rubs her feet at night because of pain. She tried walking around last night which helped a little bit. Sometimes leg pain wakes her up but overall she sleeps okay. She continues to report gait unsteadiness, she has to hold on to things all the time. No falls. No neck or back pain. She fills her own pillbox and manages her own medications.   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 She had been seeing epileptologist Dr. Doreen Beam, records were reviewed and will be summarized as follows. In the past, seizures would start with headache and burning sensation of the left arm, which may be followed by left arm jerking. Over time, she has not had any prior warning symptoms. She has had seizures where she is noted to have kicking and rocking movements of the pelvis with unresponsiveness. She was in the EMU in 10/2012 where 11 seizures were captured from the right posterior hemisphere/right inferior temporal region. Interictal EEG showed abundant right posterior temporal epileptiform discharges, frequent right anterior temporal epileptiform discharges, as well as occasional independent left anterior temporal epileptiform discharges. There was focal slowing over the right posterior hemisphere. MRI brain in 2014 showed right parietal/occipital encephalomalacia, right hippocampus smaller than left. PET scan in 2015 showed multiple areas of hypometabolism on the right, mostly corresponding to or adjacent to the structural abnormality. Ictal SPECT showed right hemisphere hypoperfusion. SISCOM showed right frontal region significant on the subtraction. MEG in 07/2013 showed spikes localizing to the right occipital, parietal, mesial temporal regions with a weak signal left mesial parietal. Concomitant EEG showed right posterior spikes. Functional mapping suggestive of left language lateralization. She has tried multiple AEDs in the past with various side effects or ineffective, and has been taking [name] 160mg  in  AM, 200mg  in PM and [name] 200mg  BID with seizures around once a month. She was admitted to [name] on 03/19/18 for left leg weakness and  drowsiness. She was fishing with her boyfriend when she suddenly could not move her left leg and almost fell. She states that her boyfriend told her that she was shaking and had a seizure while they were fishing. She was brought to [name] where [name] level was 33.8. She was adamant that she has been taking her medications as instructed and did not take more than prescribed, no new medications taken. She had an MRI brain without contrast which I personally reviewed, no acute changes seen. Large volume right parieto-occipital encephalomalacia was unchanged. Right hippocampus is smaller. She continued to report weakness and had an MRI cervical and lumbar spine which did not show any myelopathy, there was prominent left-sided facet hypertrophy at C4-5 with resultant moderate left C5 foraminal stenosis, degenerative disc osteophyte at C5-6 with severe right C6 foraminal stenosis, and mild spinal stenosis at C5-6 and C6-7, chronic borderline to mild multifactorial spinal stenosis at L2-3 through L4-5 with no convincing lateral recess or foraminal stenosis. She continued to report burning pain in her legs and was started on low dose [name] 300mg  qhs with no side effects. [name] was held in the hospital, she was discharged on a lower dose, but states that she has been taking her pre-hospital dose of 160mg  in AM, 200mg  in PM but has had 4 seizures since hospital discharge 2 weeks ago, which is unusual. She had 2 seizures in one day in the car with her mother-in-law, her mother-in-law had to pull over. The next day she had 2 at home, her daughter found her in the bathroom. She has not been sleeping well the past few days. She has a headache after the seizures. Aside from the convulsions, she has episodes where a feeling comes over her left side, from the arm down her leg, then she is weaker after. She denies any jerking. She is quite anxious in the office today and perseverates on her symptoms. She states she "stays  nervous." She reports taking prn Ativan in the past, but discontinued at [facility]. She repeatedly states her feet are numb and they "feel like they are grabbed." She feels [name] has helped some with this.    Prior AEDs: phenobarbital (ineffective, mood issues), Depakote (multiple trials, ineffective), Keppra (ineffective), Zonisamide (side effects on brief trial of higher dose than prescribed - 500mg  instead of 200mg ), Onfi (mouth sores), Tegretol (ineffective), Lamictal (nausea, dizziness, malaise), Felbatol (nausea, dizziness, malaise), Topamax (ineffective), Tigabine (tried per notes, details unclear), Neurontin (ineffective, nausea), Lyrica (leg pain reported on 25mg  daily, tingling), Briviact (drowsiness), Fycompa, Cenobamate   Epilepsy Risk Factors:  Right parieto-occipital AVM s/p rupture and hemorrhage in 1988, s/p craniectomy. Otherwise she had a normal birth and early development.  There is no history of febrile convulsions, CNS infections such as meningitis/encephalitis, or family history of seizures.  Diagnostic Data:  MRI brain without contrast 05/2018 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  EEG done 06/2018 showed right posterior temporal slowing, occasional epileptiform discharges over the right posterior temporal region, breach artifact in this region.   MRI cervical, thoracic, and lumbar spine done 07/2018 showed no convincing cervical or thoracic spinal cord abnormality. Mild degenerative cervical spinal stenosis at C5-6 and C6-7 with moderate or severe bilateral foraminal stenosis; no thoracic spinal or foraminal stenosis; stable borderline to mild lumbar  spinal stenosis  MRI lumbar spine in 2019 showed borderline to mild multifactorial spinal stenosis at L2-L3 and L3-L4 related to circumferential disc bulge and posterior element Hypertrophy.  EMG/NCV of both legs done 04/2018 showed  predominantly sensory axonal neuropathy, mild to moderate in degree, worse on the left.     PAST MEDICAL HISTORY: Past Medical History:  Diagnosis Date   Asthma    Cerebral hemorrhage (HCC) 1989   Chronic back pain    COPD (chronic obstructive pulmonary disease) (HCC)    DDD (degenerative disc disease) 06/12/2013   DVT (deep venous thrombosis) (HCC)    "she's had several since 1990"   GERD (gastroesophageal reflux disease)    Headache    "usually around time when she's had a seizure"   History of blood transfusion    "when she was a baby"   History of kidney stones    History of stomach ulcers    Neuropathy    Seizures (HCC)    "because of her brain surgery"  last one 07/28/22   Stroke [name]) 1990's   family denies residual on 11/09/2014   Tunnel vision    "since brain OR"    MEDICATIONS: Current Outpatient Medications on File Prior to Visit  Medication Sig Dispense Refill   acetaminophen (TYLENOL) 325 MG tablet Take 650 mg by mouth daily as needed for mild pain or moderate pain.     [name] 100 MG ER capsule TAKE 1 CAPSULE BY MOUTH TWICE DAILY (Patient taking differently: Take 100 mg by mouth 2 (two) times daily.) 60 capsule 10   [name] 30 MG ER capsule TAKE 1 CAPSULE BY MOUTH NIGHTLY ALONG WITH 100 MG CAPSULE FOR A TOTAL NIGHTLY DOSE OF 130MG  (Patient taking differently: Take 30 mg by mouth daily.) 30 capsule 10   folic acid (FOLVITE) 1 MG tablet Take 1 mg by mouth daily.     LORazepam (ATIVAN) 1 MG tablet TAKE 1 TABLET BY MOUTH AS DIRECTED AS NEEDED FOR 3 OR MORE SEIZURES IN 24 HOURS. DO NOT TAKE MORE THAN 2 TABLETS IN 24 HOURS. 10 tablet 5   oxybutynin (DITROPAN-XL) 5 MG 24 hr tablet Take 1 tablet (5 mg total) by mouth at bedtime. 30 tablet 1   [name] 200 MG TABS tablet Take 1 tablet (200 mg total) by mouth 2 (two) times daily. 60 tablet 5   No current facility-administered medications on file prior to visit.    ALLERGIES: Allergies  Allergen Reactions   Zonegran  [Zonisamide] Other (See Comments)    Numbness and tingling over whole body   Chocolate Other (See Comments)    Triggers seizures   Codeine Nausea And Vomiting   Keppra [Levetiracetam] Other (See Comments)    Causes seizures   Lyrica [Pregabalin] Nausea And Vomiting   Olive Oil Rash    FAMILY HISTORY: Family History  Problem Relation Age of Onset   Heart attack Mother        4 MIs, started in her 23s, also vaginal cancer and colon cancer   Cancer Mother    Heart attack Father        Died suddenly age 62 - autopsy revealed heart attack her patient   Cancer Brother        Sinus cancer    SOCIAL HISTORY: Social History   Socioeconomic History   Marital status: Divorced    Spouse name: Not on file   Number of children: 3   Years of education: Not on file   Highest  education level: Not on file  Occupational History   Not on file  Tobacco Use   Smoking status: Former    Packs/day: 0.50    Years: 20.00    Additional pack years: 0.00    Total pack years: 10.00    Types: Cigarettes    Quit date: 10/26/2017    Years since quitting: 5.1   Smokeless tobacco: Never   Tobacco comments:    Previously smoked 2 ppd, down to 4 cigs/day, patient quit smoking in 2019  Vaping Use   Vaping Use: Never used  Substance and Sexual Activity   Alcohol use: No   Drug use: No   Sexual activity: Never    Birth control/protection: Post-menopausal  Other Topics Concern   Not on file  Social History Narrative   Pt lives in 2 story home with her daughter, daughter's family and pt's mother-in-law   Has 2 living children / 1 deceased   2022/12/19 grade education   Worked for [employer] until 1998 when she became disabled.    Right handed   Social Determinants of Health   Financial Resource Strain: Not on file  Food Insecurity: No Food Insecurity (07/29/2022)   Hunger Vital Sign    Worried About Running Out of Food in the Last Year: Never true    Ran Out of Food in the Last Year: Never true   Transportation Needs: No Transportation Needs (07/29/2022)   PRAPARE - Administrator, Civil Service (Medical): No    Lack of Transportation (Non-Medical): No  Physical Activity: Not on file  Stress: Not on file  Social Connections: Not on file  Intimate Partner Violence: Not At Risk (07/29/2022)   Humiliation, Afraid, Rape, and Kick questionnaire    Fear of Current or Ex-Partner: No    Emotionally Abused: No    Physically Abused: No    Sexually Abused: No     PHYSICAL EXAM: Vitals:   12/10/22 0940  BP: 128/62  Pulse: 72  SpO2: 92%   General: No acute distress Head:  Normocephalic/atraumatic. Torticollis with head turned to the right and side to side head tremor  Skin/Extremities: No rash, no edema, +dorsalis pedis pulses bilaterally Neurological Exam: alert and awake.. No aphasia or dysarthria. Fund of knowledge is appropriate.   Attention and concentration are normal.   Cranial nerves: Pupils equal, round. Extraocular movements intact with no nystagmus. Visual fields full.  No facial asymmetry.  Motor: Bulk and tone normal, muscle strength 5/5 throughout with no pronator drift.  Reflexes +2 throughout except for absent ankle jerks bilaterally. Finger to nose testing intact.  Gait unsteady, favoring left leg.    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 She has tried multiple AEDs in the past with various side effects. She has had several admissions for [name] toxicity and cannot tolerate higher doses of [name]. There is no need to increase dose even if she has subtherapeutic levels in the future, adjustments may be made if level is supratherapeutic and she is symptomatic, otherwise continue [name] 100mg  in AM, 130mg  in PM, [name] 200mg  BID. She self-discontinued Cenobamate, [name], and [name]. No  seizures since 11/05/22 (in setting of missed [name] doses). Main concern today is neuropathy, we discussed diagnosis and prognosis, she is agreeable to a re-trial of Lyrica 50mg  qhs, side effects discussed. She can try  OTC lidocaine cream at night. She will be referred for home physical therapy for balance therapy. She does not drive. Follow-up in 4 months, call for any changes.   Thank you for allowing me to participate in her care.  Please do not hesitate to call for any questions or concerns.    Patrcia Dolly, M.D.   CC: Jorge Ny, FNP

## 2022-12-10 NOTE — Patient Instructions (Signed)
Good to see you.  Continue all your seizure medications  2. We can give the Lyrica (Pregabalin) another try. Start low dose 50mg  every night. Depending on how you feel, we can increase dose as tolerated  3. Try Aspercreme on your legs every night to see if this helps  4. Referral will be sent for home physical therapy  5. Follow-up in 4 months, call for any changes   Seizure Precautions: 1. If medication has been prescribed for you to prevent seizures, take it exactly as directed.  Do not stop taking the medicine without talking to your doctor first, even if you have not had a seizure in a long time.   2. Avoid activities in which a seizure would cause danger to yourself or to others.  Don't operate dangerous machinery, swim alone, or climb in high or dangerous places, such as on ladders, roofs, or girders.  Do not drive unless your doctor says you may.  3. If you have any warning that you may have a seizure, lay down in a safe place where you can't hurt yourself.    4.  No driving for 6 months from last seizure, as per [facility].   Please refer to the following link on the Epile[employer] Foundation of America's website for more information: http://www.epilepsyfoundation.org/answerplace/Social/driving/drivingu.cfm   5.  Maintain good sleep hygiene.  6.  Contact your doctor if you have any problems that may be related to the medicine you are taking.  7.  Call 911 and bring the patient back to the ED if:        A.  The seizure lasts longer than 5 minutes.       B.  The patient doesn't awaken shortly after the seizure  C.  The patient has new problems such as difficulty seeing, speaking or moving  D.  The patient was injured during the seizure  E.  The patient has a temperature over 102 F (39C)  F.  The patient vomited and now is having trouble breathing

## 2022-12-11 ENCOUNTER — Encounter: Payer: Self-pay | Admitting: Gastroenterology

## 2022-12-15 ENCOUNTER — Other Ambulatory Visit: Payer: Self-pay | Admitting: Family Medicine

## 2022-12-15 ENCOUNTER — Ambulatory Visit
Admission: RE | Admit: 2022-12-15 | Discharge: 2022-12-15 | Disposition: A | Discharge status: Home / Self Care | Payer: Medicare Other | Source: Ambulatory Visit | Attending: Family Medicine | Admitting: Family Medicine

## 2022-12-15 ENCOUNTER — Telehealth: Payer: Self-pay | Admitting: Anesthesiology

## 2022-12-15 ENCOUNTER — Ambulatory Visit: Payer: Medicare Other

## 2022-12-15 DIAGNOSIS — N6323 Unspecified lump in the left breast, lower outer quadrant: Secondary | ICD-10-CM

## 2022-12-15 DIAGNOSIS — N644 Mastodynia: Secondary | ICD-10-CM

## 2022-12-18 NOTE — Telephone Encounter (Signed)
Angie from [facility] left a message stating she needs verbal orders for pt, for 2x a wk x 2 wks and 1 x a wk x 4 wks. Is to help with standing, balance, fall prevention and gait training. Call back number is [phone], can leave a message is no answer.

## 2022-12-18 NOTE — Telephone Encounter (Signed)
Angie called with [facility] given verbal orders for pt, for 2x a wk x 2 wks and 1 x a wk x 4 wks. Is to help with standing, balance, fall prevention and gait training.

## 2022-12-22 ENCOUNTER — Ambulatory Visit
Admission: RE | Admit: 2022-12-22 | Discharge: 2022-12-22 | Disposition: A | Discharge status: Home / Self Care | Payer: Medicare Other | Source: Ambulatory Visit | Attending: Family Medicine | Admitting: Family Medicine

## 2022-12-22 DIAGNOSIS — N6323 Unspecified lump in the left breast, lower outer quadrant: Secondary | ICD-10-CM

## 2022-12-22 DIAGNOSIS — N644 Mastodynia: Secondary | ICD-10-CM

## 2022-12-22 HISTORY — PX: BREAST BIOPSY: SHX20

## 2022-12-23 ENCOUNTER — Telehealth: Payer: Self-pay | Admitting: Gastroenterology

## 2022-12-23 ENCOUNTER — Encounter: Payer: Self-pay | Admitting: Gastroenterology

## 2022-12-23 ENCOUNTER — Telehealth: Payer: Self-pay | Admitting: Hematology and Oncology

## 2022-12-23 ENCOUNTER — Ambulatory Visit (AMBULATORY_SURGERY_CENTER): Payer: Medicare Other

## 2022-12-23 ENCOUNTER — Telehealth: Payer: Self-pay

## 2022-12-23 VITALS — Ht 67.0 in | Wt 135.0 lb

## 2022-12-23 DIAGNOSIS — Z1211 Encounter for screening for malignant neoplasm of colon: Secondary | ICD-10-CM

## 2022-12-23 DIAGNOSIS — E785 Hyperlipidemia, unspecified: Secondary | ICD-10-CM | POA: Insufficient documentation

## 2022-12-23 DIAGNOSIS — J9811 Atelectasis: Secondary | ICD-10-CM | POA: Insufficient documentation

## 2022-12-23 DIAGNOSIS — I10 Essential (primary) hypertension: Secondary | ICD-10-CM | POA: Insufficient documentation

## 2022-12-23 MED ORDER — NA SULFATE-K SULFATE-MG SULF 17.5-3.13-1.6 GM/177ML PO SOLN
1.0000 | Freq: Once | ORAL | 0 refills | Status: AC
Start: 2022-12-23 — End: 2022-12-23

## 2022-12-23 NOTE — Telephone Encounter (Signed)
Dr. Tomasa Rand, I just completed this patients PV. Per her daughter the patients last seizure was a week ago. Pt reports the seizure was a reaction to the shingles vaccine she received. Prior to that she reports the last one being about a month ago. Daughter did report her having surgery in January with no adverse reactions at that time. Please advise if the patient is ok to proceed as scheduled. Jonny Ruiz has not yet reviewed this patient.

## 2022-12-23 NOTE — Telephone Encounter (Signed)
PT daughter called to discuss that the PT just found out today that she has breast cancer and they will be scheduling surgery very soon for it. Her colonoscopy is scheduled for 6/5 and they want to know should she keep the scheduled procedure after finding this out. Please advise.

## 2022-12-23 NOTE — Telephone Encounter (Signed)
Spoke to patient to confirm upcoming morning [facility] clinic appointment on 5/29, paperwork will be sent via e-mail.   Gave location and time, also informed patient that the surgeon's office would be calling as well to get information from them similar to the packet that they will be receiving so make sure to do both.  Reminded patient that all providers will be coming to the clinic to see them HERE and if they had any questions to not hesitate to reach back out to myself or their navigators.

## 2022-12-23 NOTE — Telephone Encounter (Signed)
Spoke with pts daughter and she is aware of Dr. Milas Hock recommendations. Appt cancelled and recall entered for 1 year.

## 2022-12-23 NOTE — Progress Notes (Signed)
No egg or soy allergy known to patient  No issues known to pt with past sedation with any surgeries or procedures Patient denies ever being told they had issues or difficulty with intubation  No FH of Malignant Hyperthermia Pt is not on diet pills Pt is not on  home 02  Pt is not on blood thinners  Pt denies issues with constipation  No A fib or A flutter Have any cardiac testing pending-- no  Pt does not have peripheral vision hemorrhage. Pts balance is not good. She is able to ambulate to a stretcher and turn on her side for her procedure per daughter.  PV completed patient and daughter [name]. Prep instructions reviewed and sent via mychart and to address on file. Goodrx coupon for [employer] provided.  Pt instructed to use Singlecare.com or GoodRx for a price reduction on prep

## 2022-12-30 ENCOUNTER — Encounter: Payer: Self-pay | Admitting: *Deleted

## 2022-12-30 DIAGNOSIS — C50412 Malignant neoplasm of upper-outer quadrant of left female breast: Secondary | ICD-10-CM

## 2022-12-31 ENCOUNTER — Encounter: Payer: Self-pay | Admitting: *Deleted

## 2022-12-31 ENCOUNTER — Encounter: Payer: Self-pay | Admitting: Physical Therapy

## 2022-12-31 ENCOUNTER — Other Ambulatory Visit: Payer: Self-pay | Admitting: *Deleted

## 2022-12-31 ENCOUNTER — Inpatient Hospital Stay: Payer: Medicare Other

## 2022-12-31 ENCOUNTER — Encounter: Payer: Self-pay | Admitting: Genetic Counselor

## 2022-12-31 ENCOUNTER — Ambulatory Visit: Payer: Self-pay | Admitting: Surgery

## 2022-12-31 ENCOUNTER — Inpatient Hospital Stay (HOSPITAL_BASED_OUTPATIENT_CLINIC_OR_DEPARTMENT_OTHER): Payer: Medicare Other | Admitting: Genetic Counselor

## 2022-12-31 ENCOUNTER — Other Ambulatory Visit: Payer: Self-pay

## 2022-12-31 ENCOUNTER — Inpatient Hospital Stay: Payer: Medicare Other | Admitting: Licensed Clinical Social Worker

## 2022-12-31 ENCOUNTER — Inpatient Hospital Stay: Payer: Medicare Other | Attending: Hematology and Oncology | Admitting: Hematology and Oncology

## 2022-12-31 ENCOUNTER — Ambulatory Visit
Admission: RE | Admit: 2022-12-31 | Discharge: 2022-12-31 | Disposition: A | Discharge status: Home / Self Care | Payer: Medicare Other | Source: Ambulatory Visit | Attending: Radiation Oncology | Admitting: Radiation Oncology

## 2022-12-31 ENCOUNTER — Ambulatory Visit: Payer: Medicare Other | Attending: Surgery | Admitting: Physical Therapy

## 2022-12-31 VITALS — BP 122/60 | HR 62 | Temp 99.0°F | Resp 18 | Ht 67.0 in | Wt 131.0 lb

## 2022-12-31 DIAGNOSIS — R293 Abnormal posture: Secondary | ICD-10-CM | POA: Insufficient documentation

## 2022-12-31 DIAGNOSIS — R296 Repeated falls: Secondary | ICD-10-CM | POA: Diagnosis present

## 2022-12-31 DIAGNOSIS — Z170 Estrogen receptor positive status [ER+]: Secondary | ICD-10-CM

## 2022-12-31 DIAGNOSIS — Z87891 Personal history of nicotine dependence: Secondary | ICD-10-CM | POA: Insufficient documentation

## 2022-12-31 DIAGNOSIS — Z79899 Other long term (current) drug therapy: Secondary | ICD-10-CM | POA: Diagnosis not present

## 2022-12-31 DIAGNOSIS — G40909 Epilepsy, unspecified, not intractable, without status epilepticus: Secondary | ICD-10-CM | POA: Diagnosis not present

## 2022-12-31 DIAGNOSIS — R251 Tremor, unspecified: Secondary | ICD-10-CM | POA: Diagnosis present

## 2022-12-31 DIAGNOSIS — C50412 Malignant neoplasm of upper-outer quadrant of left female breast: Secondary | ICD-10-CM

## 2022-12-31 DIAGNOSIS — Z800 Family history of malignant neoplasm of digestive organs: Secondary | ICD-10-CM

## 2022-12-31 DIAGNOSIS — R262 Difficulty in walking, not elsewhere classified: Secondary | ICD-10-CM | POA: Diagnosis present

## 2022-12-31 DIAGNOSIS — Z8049 Family history of malignant neoplasm of other genital organs: Secondary | ICD-10-CM | POA: Diagnosis not present

## 2022-12-31 DIAGNOSIS — Z809 Family history of malignant neoplasm, unspecified: Secondary | ICD-10-CM | POA: Diagnosis not present

## 2022-12-31 LAB — CMP (CANCER CENTER ONLY)
ALT: 10 U/L (ref 0–44)
AST: 13 U/L — ABNORMAL LOW (ref 15–41)
Albumin: 4.2 g/dL (ref 3.5–5.0)
Alkaline Phosphatase: 66 U/L (ref 38–126)
Anion gap: 5 (ref 5–15)
BUN: 15 mg/dL (ref 8–23)
CO2: 29 mmol/L (ref 22–32)
Calcium: 9 mg/dL (ref 8.9–10.3)
Chloride: 108 mmol/L (ref 98–111)
Creatinine: 0.85 mg/dL (ref 0.44–1.00)
GFR, Estimated: >60 mL/min (ref >60)
Glucose, Bld: 75 mg/dL (ref 70–99)
Potassium: 4.2 mmol/L (ref 3.5–5.1)
Sodium: 142 mmol/L (ref 135–145)
Total Bilirubin: 0.3 mg/dL (ref 0.3–1.2)
Total Protein: 6.8 g/dL (ref 6.5–8.1)

## 2022-12-31 LAB — CBC WITH DIFFERENTIAL (CANCER CENTER ONLY)
Abs Immature Granulocytes: 0.01 10*3/uL (ref 0.00–0.07)
Basophils Absolute: 0 10*3/uL (ref 0.0–0.1)
Basophils Relative: 0 %
Eosinophils Absolute: 0 10*3/uL (ref 0.0–0.5)
Eosinophils Relative: 1 %
HCT: 41.7 % (ref 36.0–46.0)
Hemoglobin: 13.7 g/dL (ref 12.0–15.0)
Immature Granulocytes: 0 %
Lymphocytes Relative: 34 %
Lymphs Abs: 1.1 10*3/uL (ref 0.7–4.0)
MCH: 33.6 pg (ref 26.0–34.0)
MCHC: 32.9 g/dL (ref 30.0–36.0)
MCV: 102.2 fL — ABNORMAL HIGH (ref 80.0–100.0)
Monocytes Absolute: 0.2 10*3/uL (ref 0.1–1.0)
Monocytes Relative: 7 %
Neutro Abs: 1.9 10*3/uL (ref 1.7–7.7)
Neutrophils Relative %: 58 %
Platelet Count: 137 10*3/uL — ABNORMAL LOW (ref 150–400)
RBC: 4.08 MIL/uL (ref 3.87–5.11)
RDW: 12.5 % (ref 11.5–15.5)
WBC Count: 3.3 10*3/uL — ABNORMAL LOW (ref 4.0–10.5)
nRBC: 0 % (ref 0.0–0.2)

## 2022-12-31 LAB — RESEARCH LABS

## 2022-12-31 LAB — GENETIC SCREENING ORDER

## 2022-12-31 NOTE — Assessment & Plan Note (Signed)
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 Given small tumor size and ER positivity, she will proceed with surgery upfront followed by Oncotype DX testing.  We have discussed the following details about Oncotype Dx today. We have discussed about Oncotype Dx score which is a well validated prognostic scoring system which can predict outcome with endocrine therapy alone and whether chemotherapy reduces recurrence.  Typically in patients with ER positive cancers that are node negative if the RS score is high typically greater than or equal to 26, chemotherapy is recommended.  In women with intermediate recurrence score younger than 50, there can still be some role for chemotherapy in addition to endocrine therapy especially if the recurrence score is between 21-25. If chemotherapy is needed, this will precede radiation and then after radiation she will continue on antiestrogen therapy.  I did discussed that she may not be an ideal candidate for chemotherapy given her seizure disorder being triggered by stress.  However patient and her daughter are both interested in knowing more about the Oncotype results hence we will proceed with this after surgery.  If she is not a candidate for chemotherapy then she can proceed with adjuvant radiation followed by antiestrogen therapy.  I discussed the following details about antiestrogen therapy.  We have discussed options for antiestrogen therapy today  I focused most of our discussion on aromatase inhibitors.  With regards to aromatase inhibitors, we discussed mechanism of action, adverse effects including but not limited to post menopausal symptoms, arthralgias, myalgias, increased risk of cardiovascular events and bone loss.   I will plan to see her after surgery to discuss  further recommendations.

## 2022-12-31 NOTE — Progress Notes (Signed)
[facility] Clinical Social Work  Initial Assessment   [name] is a 67 y.o. year old female accompanied by daughter, [name]. Clinical Social Work was referred by  [facility]  for assessment of psychosocial needs.   SDOH (Social Determinants of Health) assessments performed: Yes SDOH Interventions    Flowsheet Row Clinical Support from 12/31/2022 in [facility] at [facility]  SDOH Interventions   Food Insecurity Interventions Intervention Not Indicated  Housing Interventions Intervention Not Indicated  Transportation Interventions Intervention Not Indicated  Financial Strain Interventions Intervention Not Indicated       SDOH Screenings   Food Insecurity: No Food Insecurity (12/31/2022)  Housing: Low Risk  (12/31/2022)  Transportation Needs: No Transportation Needs (12/31/2022)  Utilities: Not At Risk (07/29/2022)  Financial Resource Strain: Low Risk  (12/31/2022)  Tobacco Use: Medium Risk (12/31/2022)     Distress Screen completed: Yes    12/31/2022    1:33 PM  ONCBCN DISTRESS SCREENING  Screening Type Initial Screening  Distress experienced in past week (1-10) 5  Emotional problem type Nervousness/Anxiety;Adjusting to illness  Information Concerns Type Lack of info about diagnosis;Lack of info about treatment;Lack of info about complementary therapy choices  Physical Problem type Getting around;Tingling hands/feet      Family/Social Information:  Housing Arrangement: patient lives with daughter [name] & her family (husband, son 56yo, daughter Augustine Radar) Family members/support persons in your life? Family Transportation concerns: no  Employment: Legally disabled .  Income source: Secretary/administrator concerns: No Type of concern: None Food access concerns: no Religious or spiritual practice: Letha [place] helps her cope Services Currently in place:  [employer], [employer] medicare  Coping/ Adjustment to diagnosis: Patient understands treatment plan and what  happens next? yes, feels better after meeting with the medical team and learning more about the plan Patient reported stressors: Adjusting to my illness Patient enjoys time with family/ friends Current coping skills/ strengths: Active sense of humor , Motivation for treatment/growth , Religious Affiliation , and Supportive family/friends     SUMMARY: Current SDOH Barriers:  No major barriers noted today  Clinical Social Work Clinical Goal(s):  No clinical social work goals at this time  Interventions: Discussed common feeling and emotions when being diagnosed with cancer, and the importance of support during treatment Informed patient of the support team roles and support services at [facility] Provided CSW contact information and encouraged patient to call with any questions or concerns    Follow Up Plan: Patient will contact CSW with any support or resource needs Patient verbalizes understanding of plan: Yes    Leylani Duley E Damarian Priola, LCSW Clinical Social Worker [facility]

## 2022-12-31 NOTE — Research (Signed)
[employer] 2021-05 - Specimen Collection Study to Evaluate Biomarkers in Subjects with Cancer    Patient [name] was identified by Dr. Al Pimple as a potential candidate for the above listed study.  This Clinical Research Nurse met with [name], [id number], on 12/31/22 in a manner and location that ensures patient privacy to discuss participation in the above listed research study.  Patient is Accompanied by her daughter .  A copy of the informed consent document with embedded HIPAA language was provided to the patient.  Patient reads, speaks, and understands [place].    Patient was provided with the business card of this Nurse and encouraged to contact the research team with any questions.  Patient was provided the option of taking informed consent documents home to review and was encouraged to review at their convenience with their support network, including other care providers. Patient is comfortable with making a decision regarding study participation today.  As outlined in the informed consent form, this Nurse and Velva Harman discussed the purpose of the research study, the investigational nature of the study, study procedures and requirements for study participation, potential risks and benefits of study participation, as well as alternatives to participation. This study is not blinded. The patient understands participation is voluntary and they may withdraw from study participation at any time.  This study does not involve randomization.  This study does not involve an investigational drug or device. This study does not involve a placebo. Patient understands enrollment is pending full eligibility review.   Confidentiality and how the patient's information will be used as part of study participation were discussed.  Patient was informed there is reimbursement provided for their time and effort spent on trial participation.  The patient is encouraged to discuss research study  participation with their insurance provider to determine what costs they may incur as part of study participation, including research related injury.    All questions were answered to patient's satisfaction.  The informed consent with embedded HIPAA language was reviewed page by page.  The patient's mental and emotional status is appropriate to provide informed consent, and the patient verbalizes an understanding of study participation.  Patient has agreed to participate in the above listed research study and has voluntarily signed the informed consent [employer] approved version 14Jan2022 (revised 30Jan2023) with embedded HIPAA language, version 14Jan2022 (revised 30Jan2023)  on 12/31/22 at 11:50AM.  The patient was provided with a copy of the signed informed consent form with embedded HIPAA language for their reference.  No study specific procedures were obtained prior to the signing of the informed consent document.  Approximately 20 minutes was spent with the patient reviewing the informed consent documents.  Patient was not requested to complete a Release of Information form.   Eligibility: This Nurse has reviewed this patient's inclusion and exclusion criteria with the patient and confirmed [name] is eligible for study participation.  Cameo Windham, Architectural technologist, completed the 2nd review for this pt and confirmed the pt met all criteria for enrollment.  Eligibility confirmed by treating investigator, Dr. Al Pimple, who also agrees that patient should proceed with enrollment. Patient will continue with enrollment.  Medical History and Data: The research nurse asked the pt the following questions about her current and past medical history.  The pt understands that this data is needed for study purposes.    Menopausal status: [name] is post menopausal with LMP at age 67.   Medical History:  High  Blood Pressure  No Coronary Artery Disease No Lupus    No Rheumatoid  Arthritis  No Diabetes   No Lynch Syndrome  No  Is the patient currently taking a magnesium supplement?   No  Does the patient have a personal history of cancer (greater than 5 years ago)?  No  Does the patient have a family history of cancer in 1st or 2nd degree relatives? Yes If yes, Relationship(s) and Cancer type(s)? mother, vagina cancer, brother - sinus cancer   Does the patient have history of alcohol consumption? Yes   If yes, current or former? former If former, year stopped? 2010 Number of years? 10 Drinks per week? less than 1, "maybe 1 drink couple of times year"  Does the patient have history of cigarette, cigar, pipe, or chewing tobacco use?  Yes  If yes, current for former? former If yes, type (Cigarette, cigar, pipe, and/or chewing tobacco)?   cigarette If former, year stopped? 2018 Number of years? 25 years Packs/number/containers per day?  3 packs per day  Blood Collection: Research blood obtained by venipuncture per patient's preference.  Patient tolerated well without any adverse events.    Gift Card: $50 gift card given to patient in this data and specimen study.  The research nurse will request her tumor block to submit to the study and enter her study data.    Janan Ridge RN, BSN, CCRP Clinical Research Nurse Lead 12/31/2022 1:32 PM

## 2022-12-31 NOTE — H&P (View-Only) (Signed)
 Subjective   Chief Complaint: Breast Cancer   Breast MDC 12/31/22 Iruku/ [place]  History of Present Illness: [name] is a 66 y.o. female who is seen today as an office consultation at the request of Dr. Iruku for evaluation of Breast Cancer .    This is a 66-year-old female with a history of seizure disorder on Dilantin.  The patient lives independently but had a seizure about 3 weeks ago.  She stopped smoking about 5 years ago.  The patient has a family history of breast cancer in her mother.  Recently, the patient switched primary care physicians.  At her initial visit with her new primary care physician, she was noted to have a breast mass.  Mammogram and ultrasound were performed.  This revealed a 1.1 x 1.0 x 1.0 irregular mass located in the left breast at 3:00, 5 cm from the nipple.  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  The axilla was negative on ultrasound.  The patient is on no blood thinners.   Review of Systems: A complete review of systems was obtained from the patient.  I have reviewed this information and discussed as appropriate with the patient.  See HPI as well for other ROS.  Review of Systems  Genitourinary:  Positive for dysuria.  Musculoskeletal:  Positive for back pain and falls.  Neurological:  Positive for seizures.      Medical History: Past Medical History:  Diagnosis Date   Asthma, unspecified asthma severity, unspecified whether complicated, unspecified whether persistent (HHS-HCC)    COPD (chronic obstructive pulmonary disease) (CMS/HHS-HCC)    DVT (deep venous thrombosis) (CMS/HHS-HCC)    GERD (gastroesophageal reflux disease)    History of stroke    Hypertension    Seizures (CMS/HHS-HCC)     Patient Active Problem List  Diagnosis   Chronic obstructive pulmonary disease with (acute) exacerbation (CMS/HHS-HCC)   Anxiety   Arteriovenous malformation of brain (HHS-HCC)    Asthma (HHS-HCC)   Ataxia   Atrial flutter (CMS/HHS-HCC)   Chronic back pain   DVT of leg (deep venous thrombosis) (CMS/HHS-HCC)   Dysuria   GERD (gastroesophageal reflux disease)   Hyperlipidemia, unspecified   Gait disturbance   Hypertension   Malignant neoplasm of upper-outer quadrant of left breast in female, estrogen receptor positive (CMS/HHS-HCC)   PAF (paroxysmal atrial fibrillation) (CMS/HHS-HCC)   Partial epilepsy with impairment of consciousness, intractable (CMS/HHS-HCC)   Recurrent seizures (CMS/HHS-HCC)    Past Surgical History:  Procedure Laterality Date   CRANIOPLASTY FOR REPAIR SKULL DEFECT W/REPARATIVE BRAIN SURGERY  1989   IVC filter  10/12/2020   LAPAROSCOPIC TUBAL LIGATION       Allergies  Allergen Reactions   Levetiracetam Other (See Comments)    Worsened seizures  Causes seizures   Zonisamide Other (See Comments)    Numbness and tingling over whole body   Chocolate Other (See Comments)    Triggers seizures   Codeine Itching and Nausea And Vomiting   Pregabalin Nausea And Vomiting   Olive Oil Rash    Current Outpatient Medications on File Prior to Visit  Medication Sig Dispense Refill   acetaminophen (TYLENOL) 325 MG tablet Take by mouth     DILANTIN KAPSEAL 100 mg ER capsule Take 100 mg by mouth 2 (two) times daily     folic acid (FOLVITE) 1 MG tablet Take 1 mg by mouth once daily     lacosamide (VIMPAT) 200 mg tablet Take 200   mg by mouth every 12 (twelve) hours     LORazepam (ATIVAN) 1 MG tablet TAKE 1 TABLET BY MOUTH AS DIRECTED AS NEEDED FOR 3 OR MORE SEIZURES IN 24 HOURS. DO NOT TAKE MORE THAN 2 TABLETS IN 24 HOURS.     oxyBUTYnin (DITROPAN-XL) 5 MG XL tablet Take 5 mg by mouth at bedtime     pregabalin (LYRICA) 50 MG capsule Take 1 capsule by mouth at bedtime     No current facility-administered medications on file prior to visit.    Family History  Problem Relation Age of Onset   Coronary Artery Disease (Blocked arteries around heart)  Mother    Colon cancer Mother    Coronary Artery Disease (Blocked arteries around heart) Father      Social History   Tobacco Use  Smoking Status Former   Types: Cigarettes  Smokeless Tobacco Never     Social History   Socioeconomic History   Marital status: Divorced  Tobacco Use   Smoking status: Former    Types: Cigarettes   Smokeless tobacco: Never  Vaping Use   Vaping status: Never Used   Social Determinants of Health   Food Insecurity: No Food Insecurity (07/29/2022)   Received from [facility]   Hunger Vital Sign    Worried About Running Out of Food in the Last Year: Never true    Ran Out of Food in the Last Year: Never true  Transportation Needs: No Transportation Needs (07/29/2022)   Received from [facility]   PRAPARE - Transportation    Lack of Transportation (Medical): No    Lack of Transportation (Non-Medical): No    Objective:    Physical Exam   Constitutional:  WDWN in NAD, conversant, no obvious deformities; lying in bed comfortably Eyes:  Pupils equal, round; sclera anicteric; moist conjunctiva; no lid lag HENT:  Oral mucosa moist; good dentition  Neck:  No masses palpated, trachea midline; no thyromegaly Lungs:  CTA bilaterally; normal respiratory effort Breasts:  symmetric, no nipple changes; slight ecchymosis in the lateral left breast with a small palpable mass at 3:00.  No axillary lymphadenopathy. CV:  Regular rate and rhythm; no murmurs; extremities well-perfused with no edema Abd:  +bowel sounds, soft, non-tender, no palpable organomegaly; no palpable hernias Musc: Unsteady gait; no apparent clubbing or cyanosis in extremities Lymphatic:  No palpable cervical or axillary lymphadenopathy Skin:  Warm, dry; no sign of jaundice Psychiatric - alert and oriented x 4; calm mood and affect   Labs, Imaging and Diagnostic Testing:  Diagnosis Breast, left, needle core biopsy, 3 o'clock, 5cmfn, coil clip - INVASIVE DUCTAL CARCINOMA, SEE NOTE -  DUCTAL CARCINOMA IN SITU, INTERMEDIATE GRADE - TUBULE FORMATION: SCORE 3 - NUCLEAR PLEOMORPHISM: SCORE 2 - MITOTIC COUNT: SCORE 1 - TOTAL SCORE: 6 - OVERALL GRADE: 2 - LYMPHOVASCULAR INVASION: NOT IDENTIFIED - CANCER LENGTH: 0.5 CM - CALCIFICATIONS: NOT IDENTIFIED Results: GROUP 5: HER2 **NEGATIVE** Equivocal form of amplification of the HER2 gene was detected in the IHC 2+ tissue sample received from this individual. HER2 FISH was performed by a technologist and cell imaging and analysis on the [employer]. RATIO OF HER2/CEN17 SIGNALS 1.51 AVERAGE HER2 COPY NUMBER PER CELL 3.40 The ratio of HER2/CEN 17 is within the range < 2.0 of HER2/CEN 17 and a copy number of HER2 signals per cell is <4.0. Arch Pathol Lab Med 1:1,2018 Zhaoli Lane MD Pathologist, Electronic Signature ( Signed 12/25/2022)  IMMUNOHISTOCHEMICAL AND MORPHOMETRIC ANALYSIS PERFORMED MANUALLY The tumor cells are equivocal for   Her2 (2+). Her2 by FISH will be performed and the results reported separately. Estrogen Receptor: 95%, POSITIVE, MODERATE-STRONG STAINING INTENSITY Progesterone Receptor: 0%, NEGATIVE Proliferation Marker Ki67: 5% COMMENT: The negative hormone receptor study(ies) in this case has an internal positive control. REFERENCE RANGE ESTROGEN RECEPTOR NEGATIVE 0% POSITIVE =>1% REFERENCE RANGE PROGESTERONE RECEPTOR NEGATIVE 0% POSITIVE =>1% All controls stained appropriately Zhaoli Lane MD Pathologist, Electronic Signature ( Signed 12/24/2022)  CLINICAL DATA:  66-year-old female with palpable OUTER LEFT breast mass identified on clinical examination.   EXAM: DIGITAL DIAGNOSTIC BILATERAL MAMMOGRAM WITH TOMOSYNTHESIS; ULTRASOUND LEFT BREAST LIMITED   TECHNIQUE: Bilateral digital diagnostic mammography and breast tomosynthesis was performed.; Targeted ultrasound examination of the left breast was performed.   COMPARISON:  02/13/2012 mammogram   ACR Breast Density Category b: There are scattered  areas of fibroglandular density.   FINDINGS: Full field views of both breasts and spot compression views of the LEFT breast demonstrate an irregular mass within the posterior OUTER LEFT breast.   No other significant abnormalities are noted within either breast.   Targeted ultrasound is performed, showing a 1 x 1 x 1.1 cm irregular mixed echogenicity mass at the 3 o'clock position of the LEFT breast 5 cm from the nipple.   No abnormal LEFT axillary lymph nodes are noted.   IMPRESSION: 1. Suspicious 1.1 cm OUTER LEFT breast mass. Tissue sampling is recommended. No abnormal appearing LEFT axillary lymph nodes. 2. No other significant mammographic findings within either breast.   RECOMMENDATION: Ultrasound-guided LEFT breast biopsy, which will be scheduled.   I have discussed the findings and recommendations with the patient. If applicable, a reminder letter will be sent to the patient regarding the next appointment.   BI-RADS CATEGORY  4: Suspicious.     Electronically Signed   By: Jeffrey  Hu M.D.   On: 12/15/2022 09:03    Assessment and Plan:  Diagnoses and all orders for this visit:  Invasive ductal carcinoma of breast, left (CMS/HHS-HCC)  I had a long discussion with the patient and her daughter.  We also confirmed as a treatment team.  We discussed surgical options including mastectomy versus breast conservation.  This is a small tumor and the patient is a good candidate for breast conservation.  We recommend left breast radioactive seed localized lumpectomy and left axillary sentinel lymph node biopsy.The surgical procedure has been discussed with the patient.  Potential risks, benefits, alternative treatments, and expected outcomes have been explained.  All of the patient's questions at this time have been answered.  The likelihood of reaching the patient's treatment goal is good.  The patient understand the proposed surgical procedure and wishes to  proceed.    Kissy Cielo KAI Gasper Hopes, MD  12/31/2022 2:25 PM  

## 2022-12-31 NOTE — Research (Signed)
[employer] 2021-05 - Specimen Collection Study to Evaluate Biomarkers in Subjects with Cancer   This Nurse has reviewed this patient's inclusion and exclusion criteria as a second review and confirms [name] is eligible for study participation.  Patient may continue with enrollment.  Domenica Reamer, BSN, RN, [employer] Research Nurse II [phone] 12/31/2022 11:49 AM

## 2022-12-31 NOTE — H&P (Signed)
Subjective   Chief Complaint: Breast Cancer   Breast MDC 12/31/22 [place]/ [place]  History of Present Illness: [name] is a 67 y.o. female who is seen today as an office consultation at the request of Dr. Al Pimple for evaluation of Breast Cancer .    This is a 67 year old female with a history of seizure disorder on Dilantin.  The patient lives independently but had a seizure about 3 weeks ago.  She stopped smoking about 5 years ago.  The patient has a family history of breast cancer in her mother.  Recently, the patient switched primary care physicians.  At her initial visit with her new primary care physician, she was noted to have a breast mass.  Mammogram and ultrasound were performed.  This revealed a 1.1 x 1.0 x 1.0 irregular mass located in the left breast at 3:00, 5 cm from the nipple.  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  The axilla was negative on ultrasound.  The patient is on no blood thinners.   Review of Systems: A complete review of systems was obtained from the patient.  I have reviewed this information and discussed as appropriate with the patient.  See HPI as well for other ROS.  Review of Systems  Genitourinary:  Positive for dysuria.  Musculoskeletal:  Positive for back pain and falls.  Neurological:  Positive for seizures.      Medical History: Past Medical History:  Diagnosis Date   Asthma, unspecified asthma severity, unspecified whether complicated, unspecified whether persistent (HHS-HCC)    COPD (chronic obstructive pulmonary disease) (CMS/HHS-HCC)    DVT (deep venous thrombosis) (CMS/HHS-HCC)    GERD (gastroesophageal reflux disease)    History of stroke    Hypertension    Seizures (CMS/HHS-HCC)     Patient Active Problem List  Diagnosis   Chronic obstructive pulmonary disease with (acute) exacerbation (CMS/HHS-HCC)   Anxiety   Arteriovenous malformation of brain (HHS-HCC)    Asthma (HHS-HCC)   Ataxia   Atrial flutter (CMS/HHS-HCC)   Chronic back pain   DVT of leg (deep venous thrombosis) (CMS/HHS-HCC)   Dysuria   GERD (gastroesophageal reflux disease)   Hyperlipidemia, unspecified   Gait disturbance   Hypertension   Malignant neoplasm of upper-outer quadrant of left breast in female, estrogen receptor positive (CMS/HHS-HCC)   PAF (paroxysmal atrial fibrillation) (CMS/HHS-HCC)   Partial epilepsy with impairment of consciousness, intractable (CMS/HHS-HCC)   Recurrent seizures (CMS/HHS-HCC)    Past Surgical History:  Procedure Laterality Date   CRANIOPLASTY FOR REPAIR SKULL DEFECT W/REPARATIVE BRAIN SURGERY  1989   IVC filter  10/12/2020   LAPAROSCOPIC TUBAL LIGATION       Allergies  Allergen Reactions   Levetiracetam Other (See Comments)    Worsened seizures  Causes seizures   Zonisamide Other (See Comments)    Numbness and tingling over whole body   Chocolate Other (See Comments)    Triggers seizures   Codeine Itching and Nausea And Vomiting   Pregabalin Nausea And Vomiting   Olive Oil Rash    Current Outpatient Medications on File Prior to Visit  Medication Sig Dispense Refill   acetaminophen (TYLENOL) 325 MG tablet Take by mouth     DILANTIN KAPSEAL 100 mg ER capsule Take 100 mg by mouth 2 (two) times daily     folic acid (FOLVITE) 1 MG tablet Take 1 mg by mouth once daily     lacosamide (VIMPAT) 200 mg tablet Take 200  mg by mouth every 12 (twelve) hours     LORazepam (ATIVAN) 1 MG tablet TAKE 1 TABLET BY MOUTH AS DIRECTED AS NEEDED FOR 3 OR MORE SEIZURES IN 24 HOURS. DO NOT TAKE MORE THAN 2 TABLETS IN 24 HOURS.     oxyBUTYnin (DITROPAN-XL) 5 MG XL tablet Take 5 mg by mouth at bedtime     pregabalin (LYRICA) 50 MG capsule Take 1 capsule by mouth at bedtime     No current facility-administered medications on file prior to visit.    Family History  Problem Relation Age of Onset   Coronary Artery Disease (Blocked arteries around heart)  Mother    Colon cancer Mother    Coronary Artery Disease (Blocked arteries around heart) Father      Social History   Tobacco Use  Smoking Status Former   Types: Cigarettes  Smokeless Tobacco Never     Social History   Socioeconomic History   Marital status: Divorced  Tobacco Use   Smoking status: Former    Types: Cigarettes   Smokeless tobacco: Never  Vaping Use   Vaping status: Never Used   Social Determinants of Health   Food Insecurity: No Food Insecurity (07/29/2022)   Received from [facility]   Hunger Vital Sign    Worried About Running Out of Food in the Last Year: Never true    Ran Out of Food in the Last Year: Never true  Transportation Needs: No Transportation Needs (07/29/2022)   Received from [facility] - [facility]    Lack of Transportation (Medical): No    Lack of Transportation (Non-Medical): No    Objective:    Physical Exam   Constitutional:  WDWN in NAD, conversant, no obvious deformities; lying in bed comfortably Eyes:  Pupils equal, round; sclera anicteric; moist conjunctiva; no lid lag HENT:  Oral mucosa moist; good dentition  Neck:  No masses palpated, trachea midline; no thyromegaly Lungs:  CTA bilaterally; normal respiratory effort Breasts:  symmetric, no nipple changes; slight ecchymosis in the lateral left breast with a small palpable mass at 3:00.  No axillary lymphadenopathy. CV:  Regular rate and rhythm; no murmurs; extremities well-perfused with no edema Abd:  +bowel sounds, soft, non-tender, no palpable organomegaly; no palpable hernias Musc: Unsteady gait; no apparent clubbing or cyanosis in extremities Lymphatic:  No palpable cervical or axillary lymphadenopathy Skin:  Warm, dry; no sign of jaundice Psychiatric - alert and oriented x 4; calm mood and affect   Labs, Imaging and Diagnostic Testing:  Diagnosis Breast, left, needle core biopsy, 3 o'clock, 5cmfn, coil clip - INVASIVE DUCTAL CARCINOMA, SEE NOTE -  DUCTAL CARCINOMA IN SITU, INTERMEDIATE GRADE - TUBULE FORMATION: SCORE 3 - NUCLEAR PLEOMORPHISM: SCORE 2 - MITOTIC COUNT: SCORE 1 - TOTAL SCORE: 6 - OVERALL GRADE: 2 - LYMPHOVASCULAR INVASION: NOT IDENTIFIED - CANCER LENGTH: 0.5 CM - CALCIFICATIONS: NOT IDENTIFIED Results: GROUP 5: HER2 **NEGATIVE** Equivocal form of amplification of the HER2 gene was detected in the IHC 2+ tissue sample received from this individual. HER2 FISH was performed by a technologist and cell imaging and analysis on the [employer]View. RATIO OF HER2/CEN17 SIGNALS 1.51 AVERAGE HER2 COPY NUMBER PER CELL 3.40 The ratio of HER2/CEN 17 is within the range < 2.0 of HER2/CEN 17 and a copy number of HER2 signals per cell is <4.0. Arch Pathol Lab Med 1:1,2018 Marlena Clipper MD Pathologist, Electronic Signature ( Signed 12/25/2022)  IMMUNOHISTOCHEMICAL AND MORPHOMETRIC ANALYSIS PERFORMED MANUALLY The tumor cells are equivocal for  Her2 (2+). Her2 by FISH will be performed and the results reported separately. Estrogen Receptor: 95%, POSITIVE, MODERATE-STRONG STAINING INTENSITY Progesterone Receptor: 0%, NEGATIVE Proliferation Marker Ki67: 5% COMMENT: The negative hormone receptor study(ies) in this case has an internal positive control. REFERENCE RANGE ESTROGEN RECEPTOR NEGATIVE 0% POSITIVE =>1% REFERENCE RANGE PROGESTERONE RECEPTOR NEGATIVE 0% POSITIVE =>1% All controls stained appropriately Marlena Clipper MD Pathologist, Electronic Signature ( Signed 12/24/2022)  CLINICAL DATA:  67 year old female with palpable OUTER LEFT breast mass identified on clinical examination.   EXAM: DIGITAL DIAGNOSTIC BILATERAL MAMMOGRAM WITH TOMOSYNTHESIS; ULTRASOUND LEFT BREAST LIMITED   TECHNIQUE: Bilateral digital diagnostic mammography and breast tomosynthesis was performed.; Targeted ultrasound examination of the left breast was performed.   COMPARISON:  02/13/2012 mammogram   ACR Breast Density Category b: There are scattered  areas of fibroglandular density.   FINDINGS: Full field views of both breasts and spot compression views of the LEFT breast demonstrate an irregular mass within the posterior OUTER LEFT breast.   No other significant abnormalities are noted within either breast.   Targeted ultrasound is performed, showing a 1 x 1 x 1.1 cm irregular mixed echogenicity mass at the 3 o'clock position of the LEFT breast 5 cm from the nipple.   No abnormal LEFT axillary lymph nodes are noted.   IMPRESSION: 1. Suspicious 1.1 cm OUTER LEFT breast mass. Tissue sampling is recommended. No abnormal appearing LEFT axillary lymph nodes. 2. No other significant mammographic findings within either breast.   RECOMMENDATION: Ultrasound-guided LEFT breast biopsy, which will be scheduled.   I have discussed the findings and recommendations with the patient. If applicable, a reminder letter will be sent to the patient regarding the next appointment.   BI-RADS CATEGORY  4: Suspicious.     Electronically Signed   By: Harmon Pier M.D.   On: 12/15/2022 09:03    Assessment and Plan:  Diagnoses and all orders for this visit:  Invasive ductal carcinoma of breast, left (CMS/HHS-HCC)  I had a long discussion with the patient and her daughter.  We also confirmed as a treatment team.  We discussed surgical options including mastectomy versus breast conservation.  This is a small tumor and the patient is a good candidate for breast conservation.  We recommend left breast radioactive seed localized lumpectomy and left axillary sentinel lymph node biopsy.The surgical procedure has been discussed with the patient.  Potential risks, benefits, alternative treatments, and expected outcomes have been explained.  All of the patient's questions at this time have been answered.  The likelihood of reaching the patient's treatment goal is good.  The patient understand the proposed surgical procedure and wishes to  proceed.    Ethelda Deangelo Delbert Harness, MD  12/31/2022 2:25 PM

## 2022-12-31 NOTE — Progress Notes (Signed)
REFERRING PROVIDER: [name], FNP [address] [place],  [place] [address]  PRIMARY PROVIDER:  [name], FNP  PRIMARY REASON FOR VISIT:  1. Malignant neoplasm of upper-outer quadrant of left breast in female, estrogen receptor positive (HCC)      HISTORY OF PRESENT ILLNESS:   [name], a 67 y.o. female, was seen for a [place] cancer genetics consultation at the request of Dr. Madilyn Fireman due to a personal history of breast cancer.  [name] presents to clinic today to discuss the possibility of a hereditary predisposition to cancer, genetic testing, and to further clarify her future cancer risks, as well as potential cancer risks for family members.   In May 2024, at the age of 84, [name] was diagnosed with cancer of the left breast. The treatment plan lumpectomy.     CANCER HISTORY:  Oncology History  Malignant neoplasm of upper-outer quadrant of left breast in female, estrogen receptor positive (HCC)  12/15/2022 Mammogram   IMPRESSION: 1. Suspicious 1.1 cm OUTER LEFT breast mass. Tissue sampling is recommended. No abnormal appearing LEFT axillary lymph nodes. 2. No other significant mammographic findings within either breast.   12/22/2022 Pathology Results   Pathology showed grade 2 IDC, ER 95% moderate-strong staining, PR neg. Her 2 neg.   12/30/2022 Initial Diagnosis   Malignant neoplasm of upper-outer quadrant of left breast in female, estrogen receptor positive (HCC)   12/31/2022 Cancer Staging   Staging form: Breast, AJCC 8th Edition - Clinical stage from 12/31/2022: Stage IA (cT1c, cN0, cM0, G2, ER+, PR-, HER2-) - Signed by [name], MD on 12/31/2022 Stage prefix: Initial diagnosis Histologic grading system: 3 grade system Laterality: Left Staged by: Pathologist and managing physician Stage used in treatment planning: Yes National guidelines used in treatment planning: Yes Type of national guideline used in treatment planning: NCCN       RISK FACTORS:  Menarche was at age 57.  First live birth at age 71.  Ovaries intact: yes.  Hysterectomy: no.  Menopausal status: postmenopausal.  HRT use: 0 years. Colonoscopy: no; not examined. Mammogram within the last year: yes. Number of breast biopsies: 1. Up to date with pelvic exams: n/a. Any excessive radiation exposure in the past: no  Past Medical History:  Diagnosis Date   Asthma    Cerebral hemorrhage (HCC) 1989   Chronic back pain    COPD (chronic obstructive pulmonary disease) (HCC)    DDD (degenerative disc disease) 06/12/2013   DVT (deep venous thrombosis) (HCC)    "she's had several since 1990"   GERD (gastroesophageal reflux disease)    Headache    "usually around time when she's had a seizure"   History of blood transfusion    "when she was a baby"   History of kidney stones    History of stomach ulcers    Neuropathy    Seizures (HCC)    "because of her brain surgery"  last one 07/28/22   Stroke [facility]) 1990's   family denies residual on 11/09/2014   Tunnel vision    "since brain OR"    Past Surgical History:  Procedure Laterality Date   BRAIN SURGERY  1989   craniotomy with hematoma evacuation   BREAST BIOPSY Left 12/22/2022   [place] LT BREAST BX W LOC DEV 1ST LESION IMG BX SPEC US GUIDE 12/22/2022 [facility] MAMMOGRAPHY   CESAREAN SECTION  1979; 1987; 1989   HEMORRHOID SURGERY     IVC FILTER INSERTION N/A 10/12/2020  Procedure: IVC FILTER INSERTION;  Surgeon: Chuck Hint, MD;  Location: [facility] INVASIVE CV LAB;  Service: Cardiovascular;  Laterality: N/A;   OPEN REDUCTION INTERNAL FIXATION (ORIF) DISTAL RADIAL FRACTURE Left 08/25/2022   Procedure: OPEN REDUCTION INTERNAL FIXATION (ORIF) DISTAL RADIUS FRACTURE of nascent malunion;  Surgeon: Bradly Bienenstock, MD;  Location: [facility] OR;  Service: Orthopedics;  Laterality: Left;  regional with iv sedation   TUBAL LIGATION  1990's    Social History   Socioeconomic History   Marital status: Divorced     Spouse name: Not on file   Number of children: 3   Years of education: Not on file   Highest education level: Not on file  Occupational History   Not on file  Tobacco Use   Smoking status: Former    Packs/day: 0.50    Years: 20.00    Additional pack years: 0.00    Total pack years: 10.00    Types: Cigarettes    Quit date: 10/26/2017    Years since quitting: 5.1   Smokeless tobacco: Never   Tobacco comments:    Previously smoked 2 ppd, down to 4 cigs/day, patient quit smoking in 2019  Vaping Use   Vaping Use: Never used  Substance and Sexual Activity   Alcohol use: No   Drug use: No   Sexual activity: Never    Birth control/protection: Post-menopausal  Other Topics Concern   Not on file  Social History Narrative   Pt lives in 2 story home with her daughter, daughter's family and pt's mother-in-law   Has 2 living children / 1 deceased   01-30-23 grade education   Worked for [employer] until 1998 when she became disabled.    Right handed   Social Determinants of Health   Financial Resource Strain: Not on file  Food Insecurity: No Food Insecurity (07/29/2022)   Hunger Vital Sign    Worried About Running Out of Food in the Last Year: Never true    Ran Out of Food in the Last Year: Never true  Transportation Needs: No Transportation Needs (07/29/2022)   PRAPARE - Administrator, Civil Service (Medical): No    Lack of Transportation (Non-Medical): No  Physical Activity: Not on file  Stress: Not on file  Social Connections: Not on file     FAMILY HISTORY:  We obtained a detailed, 4-generation family history.  Significant diagnoses are listed below: Family History  Problem Relation Age of Onset   Colon cancer Mother        started as vaginal cancer   Heart attack Mother        4 MIs, started in her 55s, also vaginal cancer and colon cancer   Cancer Mother    Heart attack Father        Died suddenly age 68 - autopsy revealed heart attack her patient   Cancer  Brother        Sinus cancer   Cervical cancer Maternal Aunt    Rectal cancer Neg Hx    Stomach cancer Neg Hx    Esophageal cancer Neg Hx     The patient has three children, one is deceased.  She has a brother and sister who are cancer free.  Her parents are both deceased.  Her father died of a heart attack at 74.  He had five siblings who did not have cancer.  His parents are deceased.  The patient's mother had vaginal cancer there reportedly went to the colon.  She had one sister who had cervical cancer and 4 brothers who were cancer free.    [name] is unaware of previous family history of genetic testing for hereditary cancer risks. There is no reported Ashkenazi Jewish ancestry. There is no known consanguinity.  GENETIC COUNSELING ASSESSMENT: [name] is a 67 y.o. female with a personal history of breast which is somewhat suggestive of a sporadic predisposition to cancer. We, therefore, discussed and recommended the following at today's visit.   DISCUSSION: We discussed that, in general, most cancer is not inherited in families, but instead is sporadic or familial. Sporadic cancers occur by chance and typically happen at older ages (>50 years) as this type of cancer is caused by genetic changes acquired during an individual's lifetime. Some families have more cancers than would be expected by chance; however, the ages or types of cancer are not consistent with a known genetic mutation or known genetic mutations have been ruled out. This type of familial cancer is thought to be due to a combination of multiple genetic, environmental, hormonal, and lifestyle factors. While this combination of factors likely increases the risk of cancer, the exact source of this risk is not currently identifiable or testable.  We discussed that 5 - 10% of breast cancer is hereditary, with most cases associated with BRCA mutations.  There are other genes that can be associated with hereditary breast cancer  syndromes.  These include ATM, CHEK2 and PALB2.  We discussed that testing is beneficial for several reasons including knowing how to follow individuals after completing their treatment, identifying whether potential treatment options such as PARP inhibitors would be beneficial, and understand if other family members could be at risk for cancer and allow them to undergo genetic testing.   We discussed with [name] that the personal history does not meet insurance or NCCN criteria for genetic testing and, therefore, is not highly consistent with a familial hereditary cancer syndrome.  We feel she is at low risk to harbor a gene mutation associated with such a condition.  [name] is still interested in testing.  We reviewed the characteristics, features and inheritance patterns of hereditary cancer syndromes. We also discussed genetic testing, including the appropriate family members to test, the process of testing, insurance coverage and turn-around-time for results. We discussed the implications of a negative, positive, carrier and/or variant of uncertain significant result. [name]  was offered a common hereditary cancer panel (47 genes) and an expanded pan-cancer panel (77 genes). [name] was informed of the benefits and limitations of each panel, including that expanded pan-cancer panels contain genes that do not have clear management guidelines at this point in time.  We also discussed that as the number of genes included on a panel increases, the chances of variants of uncertain significance increases. [name] decided to pursue genetic testing for the STAT panel, ultimately reflexing to the Multi-cancer + RNA gene panel.   The Multi-Cancer + RNA Panel offered by [facility] includes sequencing and/or deletion/duplication analysis of the following 70 genes:  AIP*, ALK, APC*, ATM*, AXIN2*, BAP1*, BARD1*, BLM*, BMPR1A*, BRCA1*, BRCA2*, BRIP1*, CDC73*, CDH1*, CDK4, CDKN1B*, CDKN2A, CHEK2*, CTNNA1*,  DICER1*, EPCAM (del/dup only), EGFR, FH*, FLCN*, GREM1 (promoter dup only), HOXB13, KIT, LZTR1, MAX*, MBD4, MEN1*, MET, MITF, MLH1*, MSH2*, MSH3*, MSH6*, MUTYH*, NF1*, NF2*, NTHL1*, PALB2*, PDGFRA, PMS2*, POLD1*, POLE*, POT1*, PRKAR1A*, PTCH1*, PTEN*, RAD51C*, RAD51D*, RB1*, RET, SDHA* (sequencing only), SDHAF2*, SDHB*, SDHC*, SDHD*, SMAD4*, SMARCA4*, SMARCB1*, SMARCE1*, STK11*, SUFU*, ZOXW960*, TP53*, TSC1*,  TSC2*, VHL*. RNA analysis is performed for * genes.   The patient does not meet NCCN criteria, however she does meet the American Breast Surgeon criteria.  We anticipate that her out of pocket costs should not be more than $100.  The testing laboratory will contact her if her costs are higher than $100.  PLAN: After considering the risks, benefits, and limitations, [name] provided informed consent to pursue genetic testing and the blood sample was sent to [facility] for analysis of the Multi-cancer + RNA . Results should be available within approximately 2-3 weeks' time, at which point they will be disclosed by telephone to [name], as will any additional recommendations warranted by these results. [name] will receive a summary of her genetic counseling visit and a copy of her results once available. This information will also be available in Epic.   Lastly, we encouraged [name] to remain in contact with cancer genetics annually so that we can continuously update the family history and inform her of any changes in cancer genetics and testing that may be of benefit for this family.   [name] questions were answered to her satisfaction today. Our contact information was provided should additional questions or concerns arise. Thank you for the referral and allowing [place] to share in the care of your patient.   Natanel Snavely P. Lowell Guitar, MS, [facility] Licensed, Patent attorney [email or web address] phone: [phone]  The patient was seen for a total of less than 15  minutes in face-to-face genetic counseling.  The patient brought her daughter. Drs. Meliton Rattan, and/or [place] were available for questions, if needed..    _______________________________________________________________________ For Office Staff:  Number of people involved in session: 2 Was an Intern/ student involved with case: no

## 2022-12-31 NOTE — Progress Notes (Signed)
[facility] CONSULT NOTE  Patient Care Team: [name], FNP as PCP - General (Family Medicine) Rennis Golden Lisette Abu, MD as PCP - Cardiology (Cardiology) Van Clines, MD as Consulting Physician (Neurology) [name], MD as Consulting Physician (Hematology and Oncology) Manus Rudd, MD as Consulting Physician (General Surgery) Antony Blackbird, MD as Consulting Physician (Radiation Oncology) Pershing Proud, RN as Oncology Nurse Navigator Donnelly Angelica, RN as Oncology Nurse Navigator  CHIEF COMPLAINTS/PURPOSE OF CONSULTATION:  Newly diagnosed breast cancer  HISTORY OF PRESENTING ILLNESS:  [name] 67 y.o. female is here because of recent diagnosis of left breast cancer  I reviewed her records extensively and collaborated the history with the patient.  SUMMARY OF ONCOLOGIC HISTORY: Oncology History  Malignant neoplasm of upper-outer quadrant of left breast in female, estrogen receptor positive (HCC)  12/15/2022 Mammogram   IMPRESSION: 1. Suspicious 1.1 cm OUTER LEFT breast mass. Tissue sampling is recommended. No abnormal appearing LEFT axillary lymph nodes. 2. No other significant mammographic findings within either breast.   12/22/2022 Pathology Results   Pathology showed grade 2 IDC, ER 95% moderate-strong staining, PR neg. Her 2 neg.   12/30/2022 Initial Diagnosis   Malignant neoplasm of upper-outer quadrant of left breast in female, estrogen receptor positive (HCC)   12/31/2022 Cancer Staging   Staging form: Breast, AJCC 8th Edition - Clinical stage from 12/31/2022: Stage IA (cT1c, cN0, cM0, G2, ER+, PR-, HER2-) - Signed by [name], MD on 12/31/2022 Stage prefix: Initial diagnosis Histologic grading system: 3 grade system Laterality: Left Staged by: Pathologist and managing physician Stage used in treatment planning: Yes National guidelines used in treatment planning: Yes Type of national guideline used in treatment planning: NCCN      Last mammogram 10 yrs ago No history of breast feeding. Smoker 3 PPD for 25 yrs. Quit 4/5 yrs ago Patient arrived to the appointment with her daughter [name]. She at baseline has history of seizure disorder which is currently well-controlled on Dilantin.  She at home is pretty independent according to her daughter, does all her chores by herself.  She tells that her seizures are triggered usually by emotional distress.  She was a heavy smoker but quit smoking about 4 or 5 years ago.  Her mom had breast cancer and underwent chemotherapy.  She had 3 children, did not breast-feed for more than a few weeks.  She went to her primary care physician recently and they found a breast lump which triggered the investigation.  She she does not usually check her breasts on a regular basis.  Rest of the pertinent review of systems reviewed and negative  MEDICAL HISTORY:  Past Medical History:  Diagnosis Date   Asthma    Cerebral hemorrhage (HCC) 1989   Chronic back pain    COPD (chronic obstructive pulmonary disease) (HCC)    DDD (degenerative disc disease) 06/12/2013   DVT (deep venous thrombosis) (HCC)    "she's had several since 1990"   GERD (gastroesophageal reflux disease)    Headache    "usually around time when she's had a seizure"   History of blood transfusion    "when she was a baby"   History of kidney stones    History of stomach ulcers    Neuropathy    Seizures (HCC)    "because of her brain surgery"  last one 07/28/22   Stroke [place]) 1990's   family denies residual on 11/09/2014   Tunnel vision    "since  brain OR"    SURGICAL HISTORY: Past Surgical History:  Procedure Laterality Date   BRAIN SURGERY  1989   craniotomy with hematoma evacuation   BREAST BIOPSY Left 12/22/2022   [place] LT BREAST BX W LOC DEV 1ST LESION IMG BX SPEC US GUIDE 12/22/2022 [facility] MAMMOGRAPHY   CESAREAN SECTION  1979; 1987; 1989   HEMORRHOID SURGERY     IVC FILTER INSERTION N/A 10/12/2020   Procedure: IVC  FILTER INSERTION;  Surgeon: Chuck Hint, MD;  Location: [facility] INVASIVE CV LAB;  Service: Cardiovascular;  Laterality: N/A;   OPEN REDUCTION INTERNAL FIXATION (ORIF) DISTAL RADIAL FRACTURE Left 08/25/2022   Procedure: OPEN REDUCTION INTERNAL FIXATION (ORIF) DISTAL RADIUS FRACTURE of nascent malunion;  Surgeon: Bradly Bienenstock, MD;  Location: [facility] OR;  Service: Orthopedics;  Laterality: Left;  regional with iv sedation   TUBAL LIGATION  1990's    SOCIAL HISTORY: Social History   Socioeconomic History   Marital status: Divorced    Spouse name: Not on file   Number of children: 3   Years of education: Not on file   Highest education level: Not on file  Occupational History   Not on file  Tobacco Use   Smoking status: Former    Packs/day: 0.50    Years: 20.00    Additional pack years: 0.00    Total pack years: 10.00    Types: Cigarettes    Quit date: 10/26/2017    Years since quitting: 5.1   Smokeless tobacco: Never   Tobacco comments:    Previously smoked 2 ppd, down to 4 cigs/day, patient quit smoking in 2019  Vaping Use   Vaping Use: Never used  Substance and Sexual Activity   Alcohol use: No   Drug use: No   Sexual activity: Never    Birth control/protection: Post-menopausal  Other Topics Concern   Not on file  Social History Narrative   Pt lives in 2 story home with her daughter, daughter's family and pt's mother-in-law   Has 2 living children / 1 deceased   08-Feb-2023 grade education   Worked for [employer] until 1998 when she became disabled.    Right handed   Social Determinants of Health   Financial Resource Strain: Not on file  Food Insecurity: No Food Insecurity (07/29/2022)   Hunger Vital Sign    Worried About Running Out of Food in the Last Year: Never true    Ran Out of Food in the Last Year: Never true  Transportation Needs: No Transportation Needs (07/29/2022)   PRAPARE - Administrator, Civil Service (Medical): No    Lack of Transportation  (Non-Medical): No  Physical Activity: Not on file  Stress: Not on file  Social Connections: Not on file  Intimate Partner Violence: Not At Risk (07/29/2022)   Humiliation, Afraid, Rape, and Kick questionnaire    Fear of Current or Ex-Partner: No    Emotionally Abused: No    Physically Abused: No    Sexually Abused: No    FAMILY HISTORY: Family History  Problem Relation Age of Onset   Colon cancer Mother        started as cervical cancer   Heart attack Mother        4 MIs, started in her 109s, also vaginal cancer and colon cancer   Cancer Mother    Heart attack Father        Died suddenly age 8 - autopsy revealed heart attack her patient   Cancer Brother  Sinus cancer   Rectal cancer Neg Hx    Stomach cancer Neg Hx    Esophageal cancer Neg Hx     ALLERGIES:  is allergic to zonegran [zonisamide], chocolate, codeine, keppra [levetiracetam], lyrica [pregabalin], and olive oil.  MEDICATIONS:  Current Outpatient Medications  Medication Sig Dispense Refill   acetaminophen (TYLENOL) 325 MG tablet Take 650 mg by mouth daily as needed for mild pain or moderate pain.     DILANTIN 100 MG ER capsule Take 1 capsule (100 mg total) by mouth 2 (two) times daily. 180 capsule 3   DILANTIN 30 MG ER capsule TAKE 1 CAPSULE BY MOUTH EVERY  NIGHT ALONG WITH 100 MG CAPSULE  FOR A TOTAL EVERY NIGHT DOSE OF  130 MG 100 capsule 3   DILANTIN 30 MG ER capsule TAKE 1 CAPSULE BY MOUTH NIGHTLY ALONG WITH 100 MG CAPSULE FOR A TOTAL NIGHTLY DOSE OF 130MG  90 capsule 3   folic acid (FOLVITE) 1 MG tablet Take 1 mg by mouth daily.     LORazepam (ATIVAN) 1 MG tablet TAKE 1 TABLET BY MOUTH AS DIRECTED AS NEEDED FOR 3 OR MORE SEIZURES IN 24 HOURS. DO NOT TAKE MORE THAN 2 TABLETS IN 24 HOURS. 10 tablet 5   oxybutynin (DITROPAN-XL) 5 MG 24 hr tablet Take 1 tablet (5 mg total) by mouth at bedtime. 30 tablet 1   pregabalin (LYRICA) 50 MG capsule Take 1 capsule every night 90 capsule 1   VIMPAT 200 MG TABS tablet  Take 1 tablet (200 mg total) by mouth 2 (two) times daily. 60 tablet 5   No current facility-administered medications for this visit.     PHYSICAL EXAMINATION: ECOG PERFORMANCE STATUS: 0 - Asymptomatic  Vitals:   12/31/22 0859  BP: 122/60  Pulse: 62  Resp: 18  Temp: 99 F (37.2 C)  SpO2: 100%   Filed Weights   12/31/22 0859  Weight: 131 lb (59.4 kg)    GENERAL: Petite lady in no acute distress, chronic tremor present SKIN: skin color, texture, turgor are normal, no rashes or significant lesions EYES: normal, conjunctiva are pink and non-injected, sclera clear OROPHARYNX:no exudate, no erythema and lips, buccal mucosa, and tongue normal  NECK: supple, thyroid normal size, non-tender, without nodularity LYMPH:  no palpable lymphadenopathy in the cervical, axillary  LUNGS: clear to auscultation and percussion with normal breathing effort HEART: regular rate & rhythm and no murmurs and no lower extremity edema ABDOMEN:abdomen soft, non-tender and normal bowel sounds PSYCH: alert & oriented x 3 with fluent speech NEURO: no focal motor/sensory deficits BREAST: Palpable left breast upper outer quadrant lump measuring approximately 1 cm or slightly larger than 1 cm.  No palpable regional adenopathy  LABORATORY DATA:  I have reviewed the data as listed Lab Results  Component Value Date   WBC 3.3 (L) 12/31/2022   HGB 13.7 12/31/2022   HCT 41.7 12/31/2022   MCV 102.2 (H) 12/31/2022   PLT 137 (L) 12/31/2022   Lab Results  Component Value Date   NA 142 12/31/2022   K 4.2 12/31/2022   CL 108 12/31/2022   CO2 29 12/31/2022    RADIOGRAPHIC STUDIES: I have personally reviewed the radiological reports and agreed with the findings in the report.  ASSESSMENT AND PLAN:  Malignant neoplasm of upper-outer quadrant of left breast in female, estrogen receptor positive (HCC) This is a very pleasant 67 year old postmenopausal female patient with newly diagnosed left breast T1 cN0 M0  invasive ductal carcinoma ER positive PR negative  and HER2 negative referred to breast [facility] for additional recommendations. Given small tumor size and ER positivity, she will proceed with surgery upfront followed by Oncotype DX testing.  We have discussed the following details about Oncotype Dx today. We have discussed about Oncotype Dx score which is a well validated prognostic scoring system which can predict outcome with endocrine therapy alone and whether chemotherapy reduces recurrence.  Typically in patients with ER positive cancers that are node negative if the RS score is high typically greater than or equal to 26, chemotherapy is recommended.  In women with intermediate recurrence score younger than 50, there can still be some role for chemotherapy in addition to endocrine therapy especially if the recurrence score is between 21-25. If chemotherapy is needed, this will precede radiation and then after radiation she will continue on antiestrogen therapy.  I did discussed that she may not be an ideal candidate for chemotherapy given her seizure disorder being triggered by stress.  However patient and her daughter are both interested in knowing more about the Oncotype results hence we will proceed with this after surgery.  If she is not a candidate for chemotherapy then she can proceed with adjuvant radiation followed by antiestrogen therapy.  I discussed the following details about antiestrogen therapy.  We have discussed options for antiestrogen therapy today  I focused most of our discussion on aromatase inhibitors.  With regards to aromatase inhibitors, we discussed mechanism of action, adverse effects including but not limited to post menopausal symptoms, arthralgias, myalgias, increased risk of cardiovascular events and bone loss.   I will plan to see her after surgery to discuss further recommendations.     Total time spent: 60 minutes including history, physical exam, review of records,  counseling and coordination of care All questions were answered. The patient knows to call the clinic with any problems, questions or concerns.    [name], MD 12/31/22

## 2022-12-31 NOTE — Therapy (Signed)
OUTPATIENT PHYSICAL THERAPY BREAST CANCER BASELINE EVALUATION   Patient Name: [name] MRN: [id number] DOB:Dec 21, 1955, 67 y.o., female Today's Date: 12/31/2022  END OF SESSION:  PT End of Session - 12/31/22 1134     Visit Number 1    Number of Visits 2    Date for PT Re-Evaluation 02/25/23    PT Start Time 1010    PT Stop Time 1021   Also saw pt from [phone] for a total of 33 min   PT Time Calculation (min) 11 min    Activity Tolerance Patient tolerated treatment well    Behavior During Therapy [facility] for tasks assessed/performed             Past Medical History:  Diagnosis Date   Asthma    Cerebral hemorrhage (HCC) 1989   Chronic back pain    COPD (chronic obstructive pulmonary disease) (HCC)    DDD (degenerative disc disease) 06/12/2013   DVT (deep venous thrombosis) (HCC)    "she's had several since 1990"   GERD (gastroesophageal reflux disease)    Headache    "usually around time when she's had a seizure"   History of blood transfusion    "when she was a baby"   History of kidney stones    History of stomach ulcers    Neuropathy    Seizures (HCC)    "because of her brain surgery"  last one 07/28/22   Stroke [facility]) 1990's   family denies residual on 11/09/2014   Tunnel vision    "since brain OR"   Past Surgical History:  Procedure Laterality Date   BRAIN SURGERY  1989   craniotomy with hematoma evacuation   BREAST BIOPSY Left 12/22/2022   [place] LT BREAST BX W LOC DEV 1ST LESION IMG BX SPEC US GUIDE 12/22/2022 [facility] MAMMOGRAPHY   CESAREAN SECTION  1979; 1987; 1989   HEMORRHOID SURGERY     IVC FILTER INSERTION N/A 10/12/2020   Procedure: IVC FILTER INSERTION;  Surgeon: Chuck Hint, MD;  Location: [facility] INVASIVE CV LAB;  Service: Cardiovascular;  Laterality: N/A;   OPEN REDUCTION INTERNAL FIXATION (ORIF) DISTAL RADIAL FRACTURE Left 08/25/2022   Procedure: OPEN REDUCTION INTERNAL FIXATION (ORIF) DISTAL RADIUS FRACTURE of nascent malunion;  Surgeon:  Bradly Bienenstock, MD;  Location: [facility] OR;  Service: Orthopedics;  Laterality: Left;  regional with iv sedation   TUBAL LIGATION  1990's   Patient Active Problem List   Diagnosis Date Noted   Malignant neoplasm of upper-outer quadrant of left breast in female, estrogen receptor positive (HCC) 12/30/2022   Atelectasis 12/23/2022   Hyperlipidemia, unspecified 12/23/2022   Hypertension 12/23/2022   Folate deficiency 08/06/2022   Low vitamin B12 level 08/06/2022   Closed nondisplaced fracture of styloid process of left radius 08/06/2022   Acute bronchitis 08/06/2022   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   Stress incontinence 07/29/2022   Closed fracture of distal end of left radius 07/17/2022   Left wrist pain 07/12/2022   Toe pain, left 02/11/2021   DVT (deep venous thrombosis) (HCC) 10/10/2020   Frequent falls 10/10/2020   Dysuria 10/10/2020   PAF (paroxysmal atrial fibrillation) (HCC) 10/10/2020   Fall 08/02/2020   Atrial flutter (HCC)    COVID-19 virus infection 04/02/2020   Lactic acidosis 04/02/2020   Ataxia 08/26/2019   Elevated Dilantin level 08/26/2019   Closed fracture of proximal phalanx of great toe 01/04/2019   Closed fracture of fifth metatarsal bone 01/04/2019   Pain in left foot 01/04/2019  Altered mental status    Weakness of both lower extremities    Generalized weakness 05/25/2018   Pain    Chronic back pain    Neuropathy    Orthostasis    Tobacco abuse    Left-sided weakness 03/19/2018   Atypical chest pain 02/17/2017   Localized swelling of lower extremity 02/17/2017   GERD (gastroesophageal reflux disease) 02/17/2017   Costochondritis 02/17/2017   Seizures (HCC) 11/02/2015   Gait disturbance 11/02/2015   Right clavicle fracture 11/02/2015   UTI (urinary tract infection) 11/02/2015   Chest pain 03/31/2015   Acute respiratory failure with hypoxia (HCC) 03/31/2015   SOB (shortness of breath) 03/31/2015   Malnutrition of moderate degree (HCC) 03/31/2015    COPD with exacerbation (HCC) 03/30/2015   Hypoxia 01/18/2015   Chronic obstructive pulmonary disease with acute exacerbation (HCC)    Fever 01/17/2015   Cough 01/17/2015   Sepsis secondary to UTI (HCC) 01/17/2015   COPD (chronic obstructive pulmonary disease) (HCC)    Unintentional weight loss 01/11/2015   Anxiety 10/22/2014   Asthma 05/15/2014   Seizure (HCC) 05/15/2014   Protein calorie malnutrition (HCC) 05/15/2014   Arteriovenous malformation of brain 11/17/2013   Partial epilepsy with impairment of consciousness, intractable (HCC) 11/17/2013   Stroke due to intracerebral hemorrhage (HCC) 11/17/2013   DVT of leg (deep venous thrombosis) (HCC) 06/19/2013   DDD (degenerative disc disease) 06/12/2013   Paresthesias 06/12/2013   Tremor 06/09/2013   Hypokalemia 01/23/2013   Leukopenia 12/14/2012   Thrombocytopenia- chronic 06/29/2012   Noncompliance with medication treatment due to underuse of medication 06/29/2012   Stress-personal 06/29/2012   Recurrent seizures (HCC) 06/29/2012   Skull fracture (HCC) 02/28/2012   Dilantin toxicity 02/28/2012   Seizure disorder (HCC) 02/28/2012   REFERRING PROVIDER: Dr. Manus Rudd  REFERRING DIAG: Left breast cancer  THERAPY DIAG:  Malignant neoplasm of upper-outer quadrant of left breast in female, estrogen receptor positive (HCC)  Abnormal posture  Tremor  Difficulty in walking, not elsewhere classified  Repeated falls  Rationale for Evaluation and Treatment: Rehabilitation  ONSET DATE: 12/15/2022  SUBJECTIVE:                                                                                                                                                                                           SUBJECTIVE STATEMENT: Patient reports she is here today to be seen by her medical team for her newly diagnosed left breast cancer.   PERTINENT HISTORY:  Patient was diagnosed on 12/15/2022 with left grade 2 invasive ductal carcinoma  breast cancer. It measures 1.1 cm and is located in the upper outer quadrant. It  is ER positive, PR negative and HER2 negative with a Ki67 of 5%. She has multiple medical issues including no peripheral vision, epilepsy with frequent seizures (most recent resulted in head injury and staples placed), tremors, and a left wrist fracture from a fall during a seizure in 08/2022 which required surgery to place screws and a plate in her left wrist. Gait is very unsteady.  PATIENT GOALS:   reduce lymphedema risk and learn post op HEP.   PAIN:  Are you having pain? No  PRECAUTIONS: Active CA; Fall  HAND DOMINANCE: right  WEIGHT BEARING RESTRICTIONS: No  FALLS:  Has patient fallen in last 6 months? Yes. Number of falls 3 in the past 6 months all due to seizures  LIVING ENVIRONMENT: Patient lives with: her daughter, son-in-law, 60 and 21 y.o. grandkids, and her 93 y.o. ex-mother-in-law who has dementia Lives in: House/apartment Has following equipment at home: Dan Humphreys - 4 wheeled but she reports she chooses not to use it  OCCUPATION: On disability  LEISURE: She reports getting home health PT recently due to general weakness, falls, balance deficits  PRIOR LEVEL OF FUNCTION: Independent with basic ADLs   OBJECTIVE:  COGNITION: Overall cognitive status: Within functional limits for tasks assessed    POSTURE:  Forward head and rounded shoulders posture  UPPER EXTREMITY AROM/PROM:  A/PROM RIGHT   eval   Shoulder extension 57  Shoulder flexion 134  Shoulder abduction 156  Shoulder internal rotation 52  Shoulder external rotation 78    (Blank rows = not tested)  A/PROM LEFT   eval  Shoulder extension 58  Shoulder flexion 141  Shoulder abduction 160  Shoulder internal rotation 68  Shoulder external rotation 79    (Blank rows = not tested)  CERVICAL AROM: All within normal limits  UPPER EXTREMITY STRENGTH: Functional  LYMPHEDEMA ASSESSMENTS:   LANDMARK RIGHT   eval  10 cm  proximal to olecranon process 24.4  Olecranon process 22.7  10 cm proximal to ulnar styloid process 19  Just proximal to ulnar styloid process 15.2  Across hand at thumb web space 17.9  At base of 2nd digit 5.9  (Blank rows = not tested)  LANDMARK LEFT   eval  10 cm proximal to olecranon process 24  Olecranon process 23.2  10 cm proximal to ulnar styloid process 18.3  Just proximal to ulnar styloid process 15.7  Across hand at thumb web space 17.5  At base of 2nd digit 5.5  (Blank rows = not tested)  L-DEX LYMPHEDEMA SCREENING:  The patient was assessed using the L-Dex machine today to produce a lymphedema index baseline score. The patient will be reassessed on a regular basis (typically every 3 months) to obtain new L-Dex scores. If the score is > 6.5 points away from his/her baseline score indicating onset of subclinical lymphedema, it will be recommended to wear a compression garment for 4 weeks, 12 hours per day and then be reassessed. If the score continues to be > 6.5 points from baseline at reassessment, we will initiate lymphedema treatment. Assessing in this manner has a 95% rate of preventing clinically significant lymphedema.   L-DEX FLOWSHEETS - 12/31/22 1100       L-DEX LYMPHEDEMA SCREENING   Measurement Type Unilateral    L-DEX MEASUREMENT EXTREMITY Upper Extremity    POSITION  Standing    DOMINANT SIDE Right    At Risk Side Left    BASELINE SCORE (UNILATERAL) 4.2  QUICK DASH SURVEY:  Neldon Mc - 12/31/22 0001     Open a tight or new jar Mild difficulty    Do heavy household chores (wash walls, wash floors) Severe difficulty    Carry a shopping bag or briefcase Mild difficulty    Wash your back No difficulty    Use a knife to cut food No difficulty    Recreational activities in which you take some force or impact through your arm, shoulder, or hand (golf, hammering, tennis) Unable    During the past week, to what extent has your arm, shoulder  or hand problem interfered with your normal social activities with family, friends, neighbors, or groups? Not at all    During the past week, to what extent has your arm, shoulder or hand problem limited your work or other regular daily activities Not at all    Arm, shoulder, or hand pain. Mild    Tingling (pins and needles) in your arm, shoulder, or hand Mild    Difficulty Sleeping Mild difficulty    DASH Score 27.27 %              PATIENT EDUCATION:  Education details: Lymphedema risk reduction and post op shoulder/posture HEP Person educated: Patient Education method: Explanation, Demonstration, Handout Education comprehension: Patient verbalized understanding and returned demonstration  HOME EXERCISE PROGRAM: Patient was instructed today in a home exercise program today for post op shoulder range of motion. These included active assist shoulder flexion in sitting, scapular retraction, wall walking with shoulder abduction, and hands behind head external rotation.  She was encouraged to do these twice a day, holding 3 seconds and repeating 5 times when permitted by her physician.   ASSESSMENT:  CLINICAL IMPRESSION: Patient was diagnosed on 12/15/2022 with left grade 2 invasive ductal carcinoma breast cancer. It measures 1.1 cm and is located in the upper outer quadrant. It is ER positive, PR negative and HER2 negative with a Ki67 of 5%. She has multiple medical issues including no peripheral vision, epilepsy with frequent seizures (most recent resulted in head injury and staples placed), tremors, and a left wrist fracture from a fall during a seizure in 08/2022 which required surgery to place screws and a plate in her left wrist. Gait is very unsteady.Her multidisciplinary medical team met prior to her assessments to determine a recommended treatment plan. She is planning to have a left lumpectomy and sentinel node biopsy followed by Oncotype testing, radiation, and anti-estrogen therapy.  She will benefit from a post op PT reassessment to determine needs and from L-Dex screens every 3 months for 2 years to detect subclinical lymphedema.  Pt will benefit from skilled therapeutic intervention to improve on the following deficits: Decreased knowledge of precautions, impaired UE functional use, pain, decreased ROM, postural dysfunction.   PT treatment/interventions: ADL/self-care home management, pt/family education, therapeutic exercise  REHAB POTENTIAL: Good  CLINICAL DECISION MAKING: Stable/uncomplicated  EVALUATION COMPLEXITY: Low   GOALS: Goals reviewed with patient? YES  LONG TERM GOALS: (STG=LTG)    Name Target Date Goal status  1 Pt will be able to verbalize understanding of pertinent lymphedema risk reduction practices relevant to her dx specifically related to skin care.  Baseline:  No knowledge 12/31/2022 Achieved at eval  2 Pt will be able to return demo and/or verbalize understanding of the post op HEP related to regaining shoulder ROM. Baseline:  No knowledge 12/31/2022 Achieved at eval  3 Pt will be able to verbalize understanding of the importance of attending  the post op After Breast CA Class for further lymphedema risk reduction education and therapeutic exercise.  Baseline:  No knowledge 12/31/2022 Achieved at eval  4 Pt will demo she has regained full shoulder ROM and function post operatively compared to baselines.  Baseline: See objective measurements taken today. 01/21/2023     PLAN:  PT FREQUENCY/DURATION: EVAL and 1 follow up appointment.   PLAN FOR NEXT SESSION: will reassess 3-4 weeks post op to determine needs.   Patient will follow up at outpatient cancer rehab 3-4 weeks following surgery.  If the patient requires physical therapy at that time, a specific plan will be dictated and sent to the referring physician for approval. The patient was educated today on appropriate basic range of motion exercises to begin post operatively and the  importance of attending the After Breast Cancer class following surgery.  Patient was educated today on lymphedema risk reduction practices as it pertains to recommendations that will benefit the patient immediately following surgery.  She verbalized good understanding.    Physical Therapy Information for After Breast Cancer Surgery/Treatment:  Lymphedema is a swelling condition that you may be at risk for in your arm if you have lymph nodes removed from the armpit area.  After a sentinel node biopsy, the risk is approximately 5-9% and is higher after an axillary node dissection.  There is treatment available for this condition and it is not life-threatening.  Contact your physician or physical therapist with concerns. You may begin the 4 shoulder/posture exercises (see additional sheet) when permitted by your physician (typically a week after surgery).  If you have drains, you may need to wait until those are removed before beginning range of motion exercises.  A general recommendation is to not lift your arms above shoulder height until drains are removed.  These exercises should be done to your tolerance and gently.  This is not a "no pain/no gain" type of recovery so listen to your body and stretch into the range of motion that you can tolerate, stopping if you have pain.  If you are having immediate reconstruction, ask your plastic surgeon about doing exercises as he or she may want you to wait. We encourage you to attend the free one time ABC (After Breast Cancer) class offered by [facility].  You will learn information related to lymphedema risk, prevention and treatment and additional exercises to regain mobility following surgery.  You can call [phone] for more information.  This is offered the 1st and 3rd Monday of each month.  You only attend the class one time. While undergoing any medical procedure or treatment, try to avoid blood pressure being taken or needle  sticks from occurring on the arm on the side of cancer.   This recommendation begins after surgery and continues for the rest of your life.  This may help reduce your risk of getting lymphedema (swelling in your arm). An excellent resource for those seeking information on lymphedema is the National Lymphedema Network's web site. It can be accessed at www.lymphnet.org If you notice swelling in your hand, arm or breast at any time following surgery (even if it is many years from now), please contact your doctor or physical therapist to discuss this.  Lymphedema can be treated at any time but it is easier for you if it is treated early on.  If you feel like your shoulder motion is not returning to normal in a reasonable amount of time, please contact your surgeon  or physical therapist.  [facility] [phone]. [address], [address], [place] [address]  ABC CLASS After Breast Cancer Class  After Breast Cancer Class is a specially designed exercise class to assist you in a safe recover after having breast cancer surgery.  In this class you will learn how to get back to full function whether your drains were just removed or if you had surgery a month ago.  This one-time class is held the 1st and 3rd Monday of every month from 11:00 a.m. until 12:00 noon virtually.  This class is FREE and space is limited. For more information or to register for the next available class, call [phone].  Class Goals  Understand specific stretches to improve the flexibility of you chest and shoulder. Learn ways to safely strengthen your upper body and improve your posture. Understand the warning signs of infection and why you may be at risk for an arm infection. Learn about Lymphedema and prevention.  ** You do not attend this class until after surgery.  Drains must be removed to participate  Patient was instructed today in a home exercise program today for post op  shoulder range of motion. These included active assist shoulder flexion in sitting, scapular retraction, wall walking with shoulder abduction, and hands behind head external rotation.  She was encouraged to do these twice a day, holding 3 seconds and repeating 5 times when permitted by her physician.   Bethann Punches, [place] 12/31/22 11:43 AM

## 2022-12-31 NOTE — Progress Notes (Signed)
Radiation Oncology         [phone] ________________________________  Multidisciplinary Breast Oncology Clinic [facility]) Initial Outpatient Consultation  Name: [name] MRN: [id number]  Date: 12/31/2022  DOB: March 25, 1956  ON:GEXBM, Natale Lay, FNP  Manus Rudd, MD   REFERRING PHYSICIAN: Manus Rudd, MD  DIAGNOSIS: The encounter diagnosis was Malignant neoplasm of upper-outer quadrant of left breast in female, estrogen receptor positive (HCC).  Stage IA (cT1c, cN0, cM0) Left Breast UOQ, Invasive and in situ ductal carcinoma, ER+ / PR- / Her2-, Grade 2    ICD-10-CM   1. Malignant neoplasm of upper-outer quadrant of left breast in female, estrogen receptor positive (HCC)  C50.412    Z17.0       HISTORY OF PRESENT ILLNESS::[name] is a 67 y.o. female who is presenting to the office today for evaluation of her newly diagnosed breast cancer. She is accompanied by her daughter.   She presented with a palpable mass in the outer left breast. She subsequently underwent bilateral diagnostic mammography with tomography and left breast ultrasonography at [facility] on 12/15/22 showing: a 1.1 cm mass in the 3 o'clock left breast located 5 cmfn. No abnormal left axillary lymph nodes were appreciated.   Biopsy of the 3 o'clock left breast on 12/22/22 showed: grade 2 invasive ductal carcinoma measuring 5 mm in the greatest linear extent of the sample with intermediate grade DCIS. Prognostic indicators significant for: estrogen receptor, 95% positive with moderate-strong staining intensity and progesterone receptor, 0% negative. Proliferation marker Ki67 at 5%. HER2 negative.  Menarche: 67 years old Age at first live birth: 67 years old GP: 3 LMP: at 68 years old  Contraceptive: yes, she reports that she last used birth control at the age of 62  HRT: never used    The patient was referred today for presentation in the multidisciplinary conference.  Radiology studies and  pathology slides were presented there for review and discussion of treatment options.  A consensus was discussed regarding potential next steps.  PREVIOUS RADIATION THERAPY: No  PAST MEDICAL HISTORY:  Past Medical History:  Diagnosis Date   Asthma    Cerebral hemorrhage (HCC) 1989   Chronic back pain    COPD (chronic obstructive pulmonary disease) (HCC)    DDD (degenerative disc disease) 06/12/2013   DVT (deep venous thrombosis) (HCC)    "she's had several since 1990"   GERD (gastroesophageal reflux disease)    Headache    "usually around time when she's had a seizure"   History of blood transfusion    "when she was a baby"   History of kidney stones    History of stomach ulcers    Neuropathy    Seizures (HCC)    "because of her brain surgery"  last one 07/28/22   Stroke [facility]) 1990's   family denies residual on 11/09/2014   Tunnel vision    "since brain OR"    PAST SURGICAL HISTORY: Past Surgical History:  Procedure Laterality Date   BRAIN SURGERY  1989   craniotomy with hematoma evacuation   BREAST BIOPSY Left 12/22/2022   [place] LT BREAST BX W LOC DEV 1ST LESION IMG BX SPEC US GUIDE 12/22/2022 [facility] MAMMOGRAPHY   CESAREAN SECTION  1979; 1987; 1989   HEMORRHOID SURGERY     IVC FILTER INSERTION N/A 10/12/2020   Procedure: IVC FILTER INSERTION;  Surgeon: Chuck Hint, MD;  Location: [facility] INVASIVE CV LAB;  Service: Cardiovascular;  Laterality: N/A;   OPEN REDUCTION  INTERNAL FIXATION (ORIF) DISTAL RADIAL FRACTURE Left 08/25/2022   Procedure: OPEN REDUCTION INTERNAL FIXATION (ORIF) DISTAL RADIUS FRACTURE of nascent malunion;  Surgeon: Bradly Bienenstock, MD;  Location: [facility] OR;  Service: Orthopedics;  Laterality: Left;  regional with iv sedation   TUBAL LIGATION  1990's    FAMILY HISTORY:  Family History  Problem Relation Age of Onset   Colon cancer Mother        started as cervical cancer   Heart attack Mother        4 MIs, started in her 36s, also vaginal cancer and colon  cancer   Cancer Mother    Heart attack Father        Died suddenly age 4 - autopsy revealed heart attack her patient   Cancer Brother        Sinus cancer   Rectal cancer Neg Hx    Stomach cancer Neg Hx    Esophageal cancer Neg Hx     SOCIAL HISTORY:  Social History   Socioeconomic History   Marital status: Divorced    Spouse name: Not on file   Number of children: 3   Years of education: Not on file   Highest education level: Not on file  Occupational History   Not on file  Tobacco Use   Smoking status: Former    Packs/day: 0.50    Years: 20.00    Additional pack years: 0.00    Total pack years: 10.00    Types: Cigarettes    Quit date: 10/26/2017    Years since quitting: 5.1   Smokeless tobacco: Never   Tobacco comments:    Previously smoked 2 ppd, down to 4 cigs/day, patient quit smoking in 2019  Vaping Use   Vaping Use: Never used  Substance and Sexual Activity   Alcohol use: No   Drug use: No   Sexual activity: Never    Birth control/protection: Post-menopausal  Other Topics Concern   Not on file  Social History Narrative   Pt lives in 2 story home with her daughter, daughter's family and pt's mother-in-law   Has 2 living children / 1 deceased   January 15, 2023 grade education   Worked for [employer] until 1998 when she became disabled.    Right handed   Social Determinants of Health   Financial Resource Strain: Not on file  Food Insecurity: No Food Insecurity (07/29/2022)   Hunger Vital Sign    Worried About Running Out of Food in the Last Year: Never true    Ran Out of Food in the Last Year: Never true  Transportation Needs: No Transportation Needs (07/29/2022)   PRAPARE - Administrator, Civil Service (Medical): No    Lack of Transportation (Non-Medical): No  Physical Activity: Not on file  Stress: Not on file  Social Connections: Not on file    ALLERGIES:  Allergies  Allergen Reactions   Zonegran [Zonisamide] Other (See Comments)    Numbness  and tingling over whole body   Chocolate Other (See Comments)    Triggers seizures   Codeine Nausea And Vomiting   Keppra [Levetiracetam] Other (See Comments)    Causes seizures   Lyrica [Pregabalin] Nausea And Vomiting   Olive Oil Rash    MEDICATIONS:  Current Outpatient Medications  Medication Sig Dispense Refill   acetaminophen (TYLENOL) 325 MG tablet Take 650 mg by mouth daily as needed for mild pain or moderate pain.     DILANTIN 100 MG ER capsule Take 1  capsule (100 mg total) by mouth 2 (two) times daily. 180 capsule 3   DILANTIN 30 MG ER capsule TAKE 1 CAPSULE BY MOUTH EVERY  NIGHT ALONG WITH 100 MG CAPSULE  FOR A TOTAL EVERY NIGHT DOSE OF  130 MG 100 capsule 3   DILANTIN 30 MG ER capsule TAKE 1 CAPSULE BY MOUTH NIGHTLY ALONG WITH 100 MG CAPSULE FOR A TOTAL NIGHTLY DOSE OF 130MG  90 capsule 3   folic acid (FOLVITE) 1 MG tablet Take 1 mg by mouth daily.     LORazepam (ATIVAN) 1 MG tablet TAKE 1 TABLET BY MOUTH AS DIRECTED AS NEEDED FOR 3 OR MORE SEIZURES IN 24 HOURS. DO NOT TAKE MORE THAN 2 TABLETS IN 24 HOURS. 10 tablet 5   oxybutynin (DITROPAN-XL) 5 MG 24 hr tablet Take 1 tablet (5 mg total) by mouth at bedtime. 30 tablet 1   pregabalin (LYRICA) 50 MG capsule Take 1 capsule every night 90 capsule 1   VIMPAT 200 MG TABS tablet Take 1 tablet (200 mg total) by mouth 2 (two) times daily. 60 tablet 5   No current facility-administered medications for this encounter.    REVIEW OF SYSTEMS: A 10+ POINT REVIEW OF SYSTEMS WAS OBTAINED including neurology, dermatology, psychiatry, cardiac, respiratory, lymph, extremities, GI, GU, musculoskeletal, constitutional, reproductive, HEENT. On the provided form, she reports episode of dribbling urine, a history of UTI's/ kidney infections, a painful breast lump, back pain, difficulties walking, a history of seizures, and numbness (unspecified). She denies any other symptoms.    PHYSICAL EXAM:     12/31/2022  Vitals with BMI   Height 5\' 7"     Weight 131 lbs   BMI 20.51   Systolic 122   Diastolic 60   Pulse 62    Lungs are clear to auscultation bilaterally. Heart has regular rate and rhythm. No palpable cervical, supraclavicular, or axillary adenopathy. Abdomen soft, non-tender, normal bowel sounds. Resting tremor noted.  Breast: Right breast with no palpable mass, nipple discharge, or bleeding. Left breast with steri strips in place along the lateral aspect of the breast. A biopsy site and some induration is also appreciated in this area.   KPS = 70  100 - Normal; no complaints; no evidence of disease. 90   - Able to carry on normal activity; minor signs or symptoms of disease. 80   - Normal activity with effort; some signs or symptoms of disease. 75   - Cares for self; unable to carry on normal activity or to do active work. 60   - Requires occasional assistance, but is able to care for most of his personal needs. 50   - Requires considerable assistance and frequent medical care. 40   - Disabled; requires special care and assistance. 30   - Severely disabled; hospital admission is indicated although death not imminent. 20   - Very sick; hospital admission necessary; active supportive treatment necessary. 10   - Moribund; fatal processes progressing rapidly. 0     - Dead  Karnofsky DA, Abelmann WH, Craver LS and Burchenal [facility] [phone]) The use of the nitrogen mustards in the palliative treatment of carcinoma: with particular reference to bronchogenic carcinoma Cancer 1 634-56  LABORATORY DATA:  Lab Results  Component Value Date   WBC 3.3 (L) 12/31/2022   HGB 13.7 12/31/2022   HCT 41.7 12/31/2022   MCV 102.2 (H) 12/31/2022   PLT 137 (L) 12/31/2022   Lab Results  Component Value Date   NA 142 12/31/2022  K 4.2 12/31/2022   CL 108 12/31/2022   CO2 29 12/31/2022   Lab Results  Component Value Date   ALT 10 12/31/2022   AST 13 (L) 12/31/2022   ALKPHOS 66 12/31/2022   BILITOT 0.3 12/31/2022    PULMONARY FUNCTION  TEST:   Review Flowsheet        No data to display          RADIOGRAPHY: [place] LT BREAST BX W LOC DEV 1ST LESION IMG BX SPEC US GUIDE  Addendum Date: 12/23/2022   ADDENDUM REPORT: 12/23/2022 16:11 ADDENDUM: PATHOLOGY revealed: Site breast, LEFT, needle core biopsy, 3 o'clock, 5 cm fn, coil clip - INVASIVE DUCTAL CARCINOMA, SEE NOTE - DUCTAL CARCINOMA IN SITU, INTERMEDIATE GRADE - TUBULE FORMATION: SCORE 3 NUCLEAR PLEOMORPHISM: SCORE 2 - MITOTIC COUNT: SCORE 1 - TOTAL SCORE: 6 - OVERALL GRADE: 2 - LYMPHOVASCULAR INVASION: NOT IDENTIFIED - CANCER LENGTH: 0.5 CM CALCIFICATIONS: NOT IDENTIFIED Pathology results are CONCORDANT with imaging findings, per Dr. Emmaline Kluver. Pathology results and recommendations below were discussed with patient by telephone on 12/23/2022. Patient reported biopsy site within normal limits with slight tenderness at the site. Post biopsy care instructions were reviewed, questions were answered and my direct phone number was provided to patient. Patient was instructed to call [facility] if any concerns or questions arise related to the biopsy. RECOMMENDATION: Surgical consultation has been arranged for patient to see [facility]. Pathology results reported by Lynett Grimes, RN on 12/23/2022. Electronically Signed   By: Emmaline Kluver M.D.   On: 12/23/2022 16:11   Result Date: 12/23/2022 CLINICAL DATA:  67 year old female presenting for biopsy of a mass in the left breast at 3 o'clock. EXAM: ULTRASOUND GUIDED LEFT BREAST CORE NEEDLE BIOPSY COMPARISON:  Previous exam(s). PROCEDURE: I met with the patient and we discussed the procedure of ultrasound-guided biopsy, including benefits and alternatives. We discussed the high likelihood of a successful procedure. We discussed the risks of the procedure, including infection, bleeding, tissue injury, clip migration, and inadequate sampling. Informed written consent was given. The usual time-out  protocol was performed immediately prior to the procedure. Lesion quadrant: Upper outer quadrant Using sterile technique and 1% Lidocaine as local anesthetic, under direct ultrasound visualization, a 14 gauge spring-loaded device was used to perform biopsy of a mass in the left breast at 3 o'clock using a lateral approach. At the conclusion of the procedure a coil shaped tissue marker clip was deployed into the biopsy cavity. Follow up 2 view mammogram was performed and dictated separately. IMPRESSION: Ultrasound guided biopsy of a mass in the left breast at 3 o'clock. No apparent complications. Electronically Signed: By: Emmaline Kluver M.D. On: 12/22/2022 08:25  MM CLIP PLACEMENT LEFT  Result Date: 12/22/2022 CLINICAL DATA:  Post procedure mammogram for clip placement. EXAM: 3D DIAGNOSTIC LEFT MAMMOGRAM POST ULTRASOUND BIOPSY COMPARISON:  Previous exam(s). FINDINGS: 3D Mammographic images were obtained following ultrasound guided biopsy of a mass in the left breast at 3 o'clock. The biopsy marking clip is in expected position at the site of biopsy. IMPRESSION: Appropriate positioning of the coil shaped biopsy marking clip at the site of biopsy in the left breast at 3 o'clock. Final Assessment: Post Procedure Mammograms for Marker Placement Electronically Signed   By: Emmaline Kluver M.D.   On: 12/22/2022 08:25  MM 3D DIAGNOSTIC MAMMOGRAM BILATERAL BREAST  Result Date: 12/15/2022 CLINICAL DATA:  67 year old female with palpable OUTER LEFT breast mass identified on clinical examination. EXAM:  DIGITAL DIAGNOSTIC BILATERAL MAMMOGRAM WITH TOMOSYNTHESIS; ULTRASOUND LEFT BREAST LIMITED TECHNIQUE: Bilateral digital diagnostic mammography and breast tomosynthesis was performed.; Targeted ultrasound examination of the left breast was performed. COMPARISON:  02/13/2012 mammogram ACR Breast Density Category b: There are scattered areas of fibroglandular density. FINDINGS: Full field views of both breasts and spot  compression views of the LEFT breast demonstrate an irregular mass within the posterior OUTER LEFT breast. No other significant abnormalities are noted within either breast. Targeted ultrasound is performed, showing a 1 x 1 x 1.1 cm irregular mixed echogenicity mass at the 3 o'clock position of the LEFT breast 5 cm from the nipple. No abnormal LEFT axillary lymph nodes are noted. IMPRESSION: 1. Suspicious 1.1 cm OUTER LEFT breast mass. Tissue sampling is recommended. No abnormal appearing LEFT axillary lymph nodes. 2. No other significant mammographic findings within either breast. RECOMMENDATION: Ultrasound-guided LEFT breast biopsy, which will be scheduled. I have discussed the findings and recommendations with the patient. If applicable, a reminder letter will be sent to the patient regarding the next appointment. BI-RADS CATEGORY  4: Suspicious. Electronically Signed   By: Harmon Pier M.D.   On: 12/15/2022 09:03  [place] LIMITED ULTRASOUND INCLUDING AXILLA LEFT BREAST   Result Date: 12/15/2022 CLINICAL DATA:  67 year old female with palpable OUTER LEFT breast mass identified on clinical examination. EXAM: DIGITAL DIAGNOSTIC BILATERAL MAMMOGRAM WITH TOMOSYNTHESIS; ULTRASOUND LEFT BREAST LIMITED TECHNIQUE: Bilateral digital diagnostic mammography and breast tomosynthesis was performed.; Targeted ultrasound examination of the left breast was performed. COMPARISON:  02/13/2012 mammogram ACR Breast Density Category b: There are scattered areas of fibroglandular density. FINDINGS: Full field views of both breasts and spot compression views of the LEFT breast demonstrate an irregular mass within the posterior OUTER LEFT breast. No other significant abnormalities are noted within either breast. Targeted ultrasound is performed, showing a 1 x 1 x 1.1 cm irregular mixed echogenicity mass at the 3 o'clock position of the LEFT breast 5 cm from the nipple. No abnormal LEFT axillary lymph nodes are noted. IMPRESSION: 1.  Suspicious 1.1 cm OUTER LEFT breast mass. Tissue sampling is recommended. No abnormal appearing LEFT axillary lymph nodes. 2. No other significant mammographic findings within either breast. RECOMMENDATION: Ultrasound-guided LEFT breast biopsy, which will be scheduled. I have discussed the findings and recommendations with the patient. If applicable, a reminder letter will be sent to the patient regarding the next appointment. BI-RADS CATEGORY  4: Suspicious. Electronically Signed   By: Harmon Pier M.D.   On: 12/15/2022 09:03     IMPRESSION: Stage IA (cT1c, cN0, cM0) Left Breast UOQ, Invasive and in situ ductal carcinoma, ER+ / PR- / Her2-, Grade 2   Patient will be a good candidate for breast conservation with radiotherapy to the left breast. We discussed the general course of radiation, potential side effects, and toxicities with radiation and the patient is interested in this approach.   PLAN:  Genetics  Left lumpectomy  Oncotype  Adjuvant radiation therapy  Aromatase inhibitor    ------------------------------------------------  Billie Lade, PhD, MD  This document serves as a record of services personally performed by Antony Blackbird, MD. It was created on his behalf by Neena Rhymes, a trained medical scribe. The creation of this record is based on the scribe's personal observations and the provider's statements to them. This document has been checked and approved by the attending provider.

## 2023-01-02 ENCOUNTER — Other Ambulatory Visit: Payer: Self-pay | Admitting: Surgery

## 2023-01-02 DIAGNOSIS — C50412 Malignant neoplasm of upper-outer quadrant of left female breast: Secondary | ICD-10-CM

## 2023-01-05 ENCOUNTER — Encounter (HOSPITAL_BASED_OUTPATIENT_CLINIC_OR_DEPARTMENT_OTHER): Payer: Self-pay | Admitting: Surgery

## 2023-01-05 ENCOUNTER — Other Ambulatory Visit: Payer: Self-pay

## 2023-01-05 NOTE — Progress Notes (Signed)
Chart reviewed with anesthesia, patient with recent seizure approx 3 weeks ago. Patient takes seizure medications routinely and follows with neuro. Ok per Dr. Chaney Malling to proceed with surgery as planned at [facility].

## 2023-01-06 ENCOUNTER — Telehealth: Payer: Self-pay | Admitting: Hematology and Oncology

## 2023-01-06 NOTE — Telephone Encounter (Signed)
Spoke with patient confirming upcoming appointment  

## 2023-01-07 ENCOUNTER — Telehealth: Payer: Self-pay | Admitting: Genetic Counselor

## 2023-01-07 ENCOUNTER — Encounter: Payer: Medicare Other | Admitting: Gastroenterology

## 2023-01-07 NOTE — Telephone Encounter (Signed)
Revealed that the Stat panel was normal.  There was a TP53 VUS identified, but explained that a VUS will not change medical management.  We will call with the remainder of the pan cancer panel when it is completed.

## 2023-01-08 ENCOUNTER — Encounter: Payer: Self-pay | Admitting: *Deleted

## 2023-01-08 ENCOUNTER — Telehealth: Payer: Self-pay | Admitting: *Deleted

## 2023-01-08 NOTE — Telephone Encounter (Signed)
Spoke with daughter to follow up from [facility] 5/29 and assess navigation needs. Denies any concerns at this time. Reviewed appts and encouraged her to call should anything arise. Verbalized understanding.

## 2023-01-09 ENCOUNTER — Ambulatory Visit
Admission: RE | Admit: 2023-01-09 | Discharge: 2023-01-09 | Disposition: A | Discharge status: Home / Self Care | Payer: Medicare HMO | Source: Ambulatory Visit | Attending: Surgery | Admitting: Surgery

## 2023-01-09 DIAGNOSIS — C50912 Malignant neoplasm of unspecified site of left female breast: Secondary | ICD-10-CM | POA: Diagnosis not present

## 2023-01-09 DIAGNOSIS — D0512 Intraductal carcinoma in situ of left breast: Secondary | ICD-10-CM | POA: Diagnosis not present

## 2023-01-09 DIAGNOSIS — Z170 Estrogen receptor positive status [ER+]: Secondary | ICD-10-CM

## 2023-01-09 HISTORY — PX: BREAST BIOPSY: SHX20

## 2023-01-09 NOTE — Progress Notes (Signed)
Patient reminded to pick up presurgical drink and soap prior to surgery on Monday 01/12/23

## 2023-01-09 NOTE — Progress Notes (Addendum)
       Patient Instructions  The night before surgery:  No food after midnight. ONLY clear liquids after midnight  The day of surgery (if you do NOT have diabetes):  Drink ONE (1) Pre-Surgery Clear Ensure as directed.   This drink was given to you during your hospital  pre-op appointment visit. The pre-op nurse will instruct you on the time to drink the  Pre-Surgery Ensure depending on your surgery time. Finish the drink at the designated time by the pre-op nurse.  Nothing else to drink after completing the  Pre-Surgery Clear Ensure.  The day of surgery (if you have diabetes): Drink ONE (1) Gatorade 2 (G2) as directed. This drink was given to you during your hospital  pre-op appointment visit.  The pre-op nurse will instruct you on the time to drink the   Gatorade 2 (G2) depending on your surgery time. Color of the Gatorade may vary. Red is not allowed. Nothing else to drink after completing the  Gatorade 2 (G2).         If you have questions, please contact your surgeon's office.    Patient's family member given CHG presurgical soap. Education provided. Family member verbalized understanding.

## 2023-01-12 ENCOUNTER — Ambulatory Visit (HOSPITAL_COMMUNITY)
Admission: RE | Admit: 2023-01-12 | Discharge: 2023-01-12 | Disposition: A | Discharge status: Home / Self Care | Payer: Medicare HMO | Source: Ambulatory Visit | Attending: Surgery | Admitting: Surgery

## 2023-01-12 ENCOUNTER — Ambulatory Visit (HOSPITAL_BASED_OUTPATIENT_CLINIC_OR_DEPARTMENT_OTHER)
Admission: RE | Admit: 2023-01-12 | Discharge: 2023-01-12 | Disposition: A | Discharge status: Home / Self Care | Payer: Medicare HMO | Attending: Surgery | Admitting: Surgery

## 2023-01-12 ENCOUNTER — Encounter (HOSPITAL_BASED_OUTPATIENT_CLINIC_OR_DEPARTMENT_OTHER): Payer: Self-pay | Admitting: Surgery

## 2023-01-12 ENCOUNTER — Encounter (HOSPITAL_BASED_OUTPATIENT_CLINIC_OR_DEPARTMENT_OTHER)
Admission: RE | Disposition: A | Discharge status: Home / Self Care | Payer: Self-pay | Source: Home / Self Care | Attending: Surgery

## 2023-01-12 ENCOUNTER — Other Ambulatory Visit: Payer: Self-pay

## 2023-01-12 ENCOUNTER — Ambulatory Visit
Admission: RE | Admit: 2023-01-12 | Discharge: 2023-01-12 | Disposition: A | Discharge status: Home / Self Care | Payer: Medicare Other | Source: Ambulatory Visit | Attending: Surgery | Admitting: Surgery

## 2023-01-12 ENCOUNTER — Encounter: Payer: Self-pay | Admitting: Genetic Counselor

## 2023-01-12 ENCOUNTER — Ambulatory Visit (HOSPITAL_BASED_OUTPATIENT_CLINIC_OR_DEPARTMENT_OTHER): Payer: Medicare HMO | Admitting: Certified Registered"

## 2023-01-12 DIAGNOSIS — G40909 Epilepsy, unspecified, not intractable, without status epilepticus: Secondary | ICD-10-CM | POA: Diagnosis not present

## 2023-01-12 DIAGNOSIS — Z79899 Other long term (current) drug therapy: Secondary | ICD-10-CM | POA: Diagnosis not present

## 2023-01-12 DIAGNOSIS — I10 Essential (primary) hypertension: Secondary | ICD-10-CM

## 2023-01-12 DIAGNOSIS — Z87891 Personal history of nicotine dependence: Secondary | ICD-10-CM | POA: Insufficient documentation

## 2023-01-12 DIAGNOSIS — C50912 Malignant neoplasm of unspecified site of left female breast: Secondary | ICD-10-CM

## 2023-01-12 DIAGNOSIS — C50412 Malignant neoplasm of upper-outer quadrant of left female breast: Secondary | ICD-10-CM

## 2023-01-12 DIAGNOSIS — J449 Chronic obstructive pulmonary disease, unspecified: Secondary | ICD-10-CM

## 2023-01-12 DIAGNOSIS — N6012 Diffuse cystic mastopathy of left breast: Secondary | ICD-10-CM | POA: Diagnosis not present

## 2023-01-12 DIAGNOSIS — G8918 Other acute postprocedural pain: Secondary | ICD-10-CM | POA: Diagnosis not present

## 2023-01-12 DIAGNOSIS — Z803 Family history of malignant neoplasm of breast: Secondary | ICD-10-CM | POA: Diagnosis not present

## 2023-01-12 DIAGNOSIS — Z170 Estrogen receptor positive status [ER+]: Secondary | ICD-10-CM | POA: Insufficient documentation

## 2023-01-12 DIAGNOSIS — Z1379 Encounter for other screening for genetic and chromosomal anomalies: Secondary | ICD-10-CM | POA: Insufficient documentation

## 2023-01-12 DIAGNOSIS — N6022 Fibroadenosis of left breast: Secondary | ICD-10-CM | POA: Diagnosis not present

## 2023-01-12 DIAGNOSIS — Z9889 Other specified postprocedural states: Secondary | ICD-10-CM | POA: Diagnosis not present

## 2023-01-12 DIAGNOSIS — C50812 Malignant neoplasm of overlapping sites of left female breast: Secondary | ICD-10-CM | POA: Insufficient documentation

## 2023-01-12 DIAGNOSIS — Z01818 Encounter for other preprocedural examination: Secondary | ICD-10-CM

## 2023-01-12 HISTORY — PX: BREAST LUMPECTOMY WITH RADIOACTIVE SEED AND SENTINEL LYMPH NODE BIOPSY: SHX6550

## 2023-01-12 SURGERY — BREAST LUMPECTOMY WITH RADIOACTIVE SEED AND SENTINEL LYMPH NODE BIOPSY
Anesthesia: General | Site: Breast | Laterality: Left

## 2023-01-12 MED ORDER — MIDAZOLAM HCL 2 MG/2ML IJ SOLN
INTRAMUSCULAR | Status: AC
  Filled 2023-01-12: qty 2

## 2023-01-12 MED ORDER — AMISULPRIDE (ANTIEMETIC) 5 MG/2ML IV SOLN: 10.0000 mg | Freq: Once | INTRAVENOUS | Status: DC | PRN

## 2023-01-12 MED ORDER — OXYCODONE HCL 5 MG PO TABS
ORAL_TABLET | ORAL | Status: AC
  Filled 2023-01-12: qty 1

## 2023-01-12 MED ORDER — LACTATED RINGERS IV SOLN: INTRAVENOUS | Status: DC | PRN

## 2023-01-12 MED ORDER — LIDOCAINE 2% (20 MG/ML) 5 ML SYRINGE
INTRAMUSCULAR | Status: AC
  Filled 2023-01-12: qty 5

## 2023-01-12 MED ORDER — DEXAMETHASONE SODIUM PHOSPHATE 10 MG/ML IJ SOLN
INTRAMUSCULAR | Status: DC | PRN
  Administered 2023-01-12: 5 mg via INTRAVENOUS

## 2023-01-12 MED ORDER — ONDANSETRON HCL 4 MG/2ML IJ SOLN
INTRAMUSCULAR | Status: AC
  Filled 2023-01-12: qty 2

## 2023-01-12 MED ORDER — CEFAZOLIN SODIUM-DEXTROSE 2-4 GM/100ML-% IV SOLN: 2.0000 g | INTRAVENOUS | Status: DC

## 2023-01-12 MED ORDER — FENTANYL CITRATE (PF) 100 MCG/2ML IJ SOLN
200.0000 ug | Freq: Once | INTRAMUSCULAR | Status: AC
  Administered 2023-01-12: 100 ug via INTRAVENOUS

## 2023-01-12 MED ORDER — ACETAMINOPHEN 500 MG PO TABS
ORAL_TABLET | ORAL | Status: AC
  Filled 2023-01-12: qty 2

## 2023-01-12 MED ORDER — PHENYLEPHRINE HCL (PRESSORS) 10 MG/ML IV SOLN
INTRAVENOUS | Status: DC | PRN
  Administered 2023-01-12: 160 ug via INTRAVENOUS

## 2023-01-12 MED ORDER — PROPOFOL 10 MG/ML IV BOLUS
INTRAVENOUS | Status: AC
  Filled 2023-01-12: qty 20

## 2023-01-12 MED ORDER — ROPIVACAINE HCL 5 MG/ML IJ SOLN
INTRAMUSCULAR | Status: DC | PRN
  Administered 2023-01-12: 20 mL via PERINEURAL

## 2023-01-12 MED ORDER — IPRATROPIUM-ALBUTEROL 0.5-2.5 (3) MG/3ML IN SOLN: 3.0000 mL | Freq: Once | RESPIRATORY_TRACT | Status: DC

## 2023-01-12 MED ORDER — ACETAMINOPHEN 500 MG PO TABS
1000.0000 mg | ORAL_TABLET | ORAL | Status: AC
  Administered 2023-01-12: 1000 mg via ORAL

## 2023-01-12 MED ORDER — TECHNETIUM TC 99M TILMANOCEPT KIT
1.0000 | PACK | Freq: Once | INTRAVENOUS | Status: AC | PRN
  Administered 2023-01-12: 1 via INTRADERMAL

## 2023-01-12 MED ORDER — PROPOFOL 10 MG/ML IV BOLUS
INTRAVENOUS | Status: DC | PRN
  Administered 2023-01-12: 150 mg via INTRAVENOUS

## 2023-01-12 MED ORDER — FENTANYL CITRATE (PF) 100 MCG/2ML IJ SOLN
INTRAMUSCULAR | Status: AC
  Filled 2023-01-12: qty 2

## 2023-01-12 MED ORDER — CHLORHEXIDINE GLUCONATE CLOTH 2 % EX PADS: 6.0000 | MEDICATED_PAD | Freq: Once | CUTANEOUS | Status: DC

## 2023-01-12 MED ORDER — FENTANYL CITRATE (PF) 100 MCG/2ML IJ SOLN
100.0000 ug | Freq: Once | INTRAMUSCULAR | Status: AC
  Administered 2023-01-12: 50 ug via INTRAVENOUS

## 2023-01-12 MED ORDER — BUPIVACAINE-EPINEPHRINE 0.25% -1:200000 IJ SOLN
INTRAMUSCULAR | Status: DC | PRN
  Administered 2023-01-12: 10 mL

## 2023-01-12 MED ORDER — MIDAZOLAM HCL 2 MG/2ML IJ SOLN
2.0000 mg | Freq: Once | INTRAMUSCULAR | Status: AC
  Administered 2023-01-12: 2 mg via INTRAVENOUS

## 2023-01-12 MED ORDER — CEFAZOLIN SODIUM-DEXTROSE 2-3 GM-%(50ML) IV SOLR
INTRAVENOUS | Status: DC | PRN
  Administered 2023-01-12: 2 g via INTRAVENOUS

## 2023-01-12 MED ORDER — CEFAZOLIN SODIUM-DEXTROSE 2-4 GM/100ML-% IV SOLN
INTRAVENOUS | Status: AC
  Filled 2023-01-12: qty 100

## 2023-01-12 MED ORDER — MIDAZOLAM HCL 2 MG/2ML IJ SOLN
4.0000 mg | Freq: Once | INTRAMUSCULAR | Status: AC
  Administered 2023-01-12: 2 mg via INTRAVENOUS

## 2023-01-12 MED ORDER — LIDOCAINE HCL (CARDIAC) PF 100 MG/5ML IV SOSY
PREFILLED_SYRINGE | INTRAVENOUS | Status: DC | PRN
  Administered 2023-01-12: 40 mg via INTRAVENOUS

## 2023-01-12 MED ORDER — OXYCODONE HCL 5 MG PO TABS
5.0000 mg | ORAL_TABLET | Freq: Once | ORAL | Status: AC | PRN
  Administered 2023-01-12: 5 mg via ORAL

## 2023-01-12 MED ORDER — HYDROMORPHONE HCL 1 MG/ML IJ SOLN: 0.2500 mg | INTRAMUSCULAR | Status: DC | PRN

## 2023-01-12 MED ORDER — IPRATROPIUM-ALBUTEROL 0.5-2.5 (3) MG/3ML IN SOLN
RESPIRATORY_TRACT | Status: AC
  Filled 2023-01-12: qty 3

## 2023-01-12 MED ORDER — IPRATROPIUM-ALBUTEROL 0.5-2.5 (3) MG/3ML IN SOLN
3.0000 mL | Freq: Once | RESPIRATORY_TRACT | Status: AC
  Administered 2023-01-12: 3 mL via RESPIRATORY_TRACT

## 2023-01-12 MED ORDER — TRAMADOL HCL 50 MG PO TABS: 50.0000 mg | ORAL_TABLET | Freq: Four times a day (QID) | ORAL | 0 refills | Status: DC | PRN

## 2023-01-12 MED ORDER — BUPIVACAINE LIPOSOME 1.3 % IJ SUSP
INTRAMUSCULAR | Status: DC | PRN
  Administered 2023-01-12: 10 mL via PERINEURAL

## 2023-01-12 MED ORDER — EPHEDRINE SULFATE (PRESSORS) 50 MG/ML IJ SOLN
INTRAMUSCULAR | Status: DC | PRN
  Administered 2023-01-12 (×3): 10 mg via INTRAVENOUS
  Administered 2023-01-12: 5 mg via INTRAVENOUS

## 2023-01-12 MED ORDER — DEXAMETHASONE SODIUM PHOSPHATE 10 MG/ML IJ SOLN
INTRAMUSCULAR | Status: AC
  Filled 2023-01-12: qty 1

## 2023-01-12 MED ORDER — ONDANSETRON HCL 4 MG/2ML IJ SOLN: 4.0000 mg | Freq: Once | INTRAMUSCULAR | Status: DC | PRN

## 2023-01-12 MED ORDER — ONDANSETRON HCL 4 MG/2ML IJ SOLN
INTRAMUSCULAR | Status: DC | PRN
  Administered 2023-01-12: 4 mg via INTRAVENOUS

## 2023-01-12 MED ORDER — LACTATED RINGERS IV SOLN: INTRAVENOUS | Status: DC

## 2023-01-12 MED ORDER — MAGTRACE LYMPHATIC TRACER
INTRAMUSCULAR | Status: DC | PRN
  Administered 2023-01-12: 2 mL via INTRAMUSCULAR

## 2023-01-12 MED ORDER — OXYCODONE HCL 5 MG/5ML PO SOLN: 5.0000 mg | Freq: Once | ORAL | Status: AC | PRN

## 2023-01-12 MED ORDER — ACETAMINOPHEN 500 MG PO TABS: 1000.0000 mg | ORAL_TABLET | Freq: Once | ORAL | Status: DC

## 2023-01-12 SURGICAL SUPPLY — 55 items
APL PRP STRL LF DISP 70% ISPRP (MISCELLANEOUS) ×1
APL SKNCLS STERI-STRIP NONHPOA (GAUZE/BANDAGES/DRESSINGS) ×1
APPLIER CLIP 9.375 MED OPEN (MISCELLANEOUS)
APR CLP MED 9.3 20 MLT OPN (MISCELLANEOUS)
BENZOIN TINCTURE PRP APPL 2/3 (GAUZE/BANDAGES/DRESSINGS) ×1 IMPLANT
BINDER BREAST 3XL (GAUZE/BANDAGES/DRESSINGS) IMPLANT
BINDER BREAST LRG (GAUZE/BANDAGES/DRESSINGS) IMPLANT
BINDER BREAST MEDIUM (GAUZE/BANDAGES/DRESSINGS) IMPLANT
BINDER BREAST XLRG (GAUZE/BANDAGES/DRESSINGS) IMPLANT
BINDER BREAST XXLRG (GAUZE/BANDAGES/DRESSINGS) IMPLANT
BLADE CLIPPER SURG (BLADE) IMPLANT
BLADE HEX COATED 2.75 (ELECTRODE) ×1 IMPLANT
BLADE SURG 10 STRL SS (BLADE) IMPLANT
BLADE SURG 15 STRL LF DISP TIS (BLADE) ×1 IMPLANT
BLADE SURG 15 STRL SS (BLADE) ×1
CANISTER SUC SOCK COL 7IN (MISCELLANEOUS) ×1 IMPLANT
CANISTER SUCT 1200ML W/VALVE (MISCELLANEOUS) IMPLANT
CHLORAPREP W/TINT 26 (MISCELLANEOUS) ×1 IMPLANT
CLIP APPLIE 9.375 MED OPEN (MISCELLANEOUS) IMPLANT
COVER BACK TABLE 60X90IN (DRAPES) ×1 IMPLANT
COVER MAYO STAND STRL (DRAPES) ×1 IMPLANT
COVER PROBE CYLINDRICAL 5X96 (MISCELLANEOUS) ×1 IMPLANT
DRAPE LAPAROSCOPIC ABDOMINAL (DRAPES) ×1 IMPLANT
DRAPE UTILITY XL STRL (DRAPES) ×1 IMPLANT
DRSG TEGADERM 4X4.75 (GAUZE/BANDAGES/DRESSINGS) ×2 IMPLANT
ELECT REM PT RETURN 9FT ADLT (ELECTROSURGICAL) ×1
ELECTRODE REM PT RTRN 9FT ADLT (ELECTROSURGICAL) ×1 IMPLANT
GAUZE SPONGE 4X4 12PLY STRL LF (GAUZE/BANDAGES/DRESSINGS) IMPLANT
GLOVE BIO SURGEON STRL SZ7 (GLOVE) ×1 IMPLANT
GLOVE BIOGEL PI IND STRL 7.5 (GLOVE) ×1 IMPLANT
GOWN STRL REUS W/ TWL LRG LVL3 (GOWN DISPOSABLE) ×2 IMPLANT
GOWN STRL REUS W/TWL LRG LVL3 (GOWN DISPOSABLE) ×2
KIT MARKER MARGIN INK (KITS) ×1 IMPLANT
NDL HYPO 25X1 1.5 SAFETY (NEEDLE) ×2 IMPLANT
NDL SAFETY ECLIP 18X1.5 (MISCELLANEOUS) ×1 IMPLANT
NEEDLE HYPO 25X1 1.5 SAFETY (NEEDLE) ×2 IMPLANT
NS IRRIG 1000ML POUR BTL (IV SOLUTION) IMPLANT
PACK BASIN DAY SURGERY FS (CUSTOM PROCEDURE TRAY) ×1 IMPLANT
PENCIL SMOKE EVACUATOR (MISCELLANEOUS) ×1 IMPLANT
SLEEVE SCD COMPRESS KNEE MED (STOCKING) ×1 IMPLANT
SPIKE FLUID TRANSFER (MISCELLANEOUS) IMPLANT
SPONGE T-LAP 18X18 ~~LOC~~+RFID (SPONGE) IMPLANT
SPONGE T-LAP 4X18 ~~LOC~~+RFID (SPONGE) ×1 IMPLANT
STRIP CLOSURE SKIN 1/2X4 (GAUZE/BANDAGES/DRESSINGS) ×1 IMPLANT
SUT MON AB 4-0 PC3 18 (SUTURE) ×1 IMPLANT
SUT SILK 2 0 SH (SUTURE) IMPLANT
SUT VIC AB 3-0 SH 27 (SUTURE) ×1
SUT VIC AB 3-0 SH 27X BRD (SUTURE) ×1 IMPLANT
SYR BULB EAR ULCER 3OZ GRN STR (SYRINGE) ×1 IMPLANT
SYR CONTROL 10ML LL (SYRINGE) ×2 IMPLANT
TOWEL GREEN STERILE FF (TOWEL DISPOSABLE) ×1 IMPLANT
TRACER MAGTRACE VIAL (MISCELLANEOUS) IMPLANT
TRAY FAXITRON CT DISP (TRAY / TRAY PROCEDURE) ×1 IMPLANT
TUBE CONNECTING 20X1/4 (TUBING) ×1 IMPLANT
YANKAUER SUCT BULB TIP NO VENT (SUCTIONS) IMPLANT

## 2023-01-12 NOTE — Anesthesia Preprocedure Evaluation (Addendum)
Anesthesia Evaluation  Patient identified by MRN, date of birth, ID band Patient awake    Reviewed: Allergy & Precautions, NPO status , Patient's Chart, lab work & pertinent test results  Airway Mallampati: III  TM Distance: >3 FB Neck ROM: Full    Dental  (+) Edentulous Upper, Edentulous Lower, Dental Advisory Given   Pulmonary asthma , COPD,  COPD inhaler, former smoker Quit smoking 2019, 10 pack year history  Hasn't used inhaler since she quit smoking   Pulmonary exam normal breath sounds clear to auscultation       Cardiovascular hypertension (no meds, 129/59 preop), + DVT (s/p IVC filter 2022)  Normal cardiovascular exam Rhythm:Regular Rate:Normal     Neuro/Psych  Headaches, Seizures - (last oen about 3 weeks ago, grand mal whole body movement. took dilantin this AM, next due at 9PM tonight), Well Controlled,  PSYCHIATRIC DISORDERS Anxiety     CVA    GI/Hepatic Neg liver ROS,GERD  Controlled,,  Endo/Other  negative endocrine ROS    Renal/GU negative Renal ROS  negative genitourinary   Musculoskeletal  (+) Arthritis , Osteoarthritis,    Abdominal   Peds  Hematology negative hematology ROS (+) Hb 13.7, plt 137   Anesthesia Other Findings   Reproductive/Obstetrics negative OB ROS                              Anesthesia Physical Anesthesia Plan  ASA: 3  Anesthesia Plan: General   Post-op Pain Management: Regional block* and Tylenol PO (pre-op)*   Induction: Intravenous  PONV Risk Score and Plan: 3 and Ondansetron, Dexamethasone, Midazolam and Treatment may vary due to age or medical condition  Airway Management Planned: LMA  Additional Equipment: None  Intra-op Plan:   Post-operative Plan: Extubation in OR  Informed Consent: I have reviewed the patients History and Physical, chart, labs and discussed the procedure including the risks, benefits and alternatives for the  proposed anesthesia with the patient or authorized representative who has indicated his/her understanding and acceptance.     Dental advisory given  Plan Discussed with: CRNA  Anesthesia Plan Comments:        Anesthesia Quick Evaluation

## 2023-01-12 NOTE — Interval H&P Note (Signed)
History and Physical Interval Note:  01/12/2023 9:30 AM  [name]  has presented today for surgery, with the diagnosis of LEFT BREAST INVASIVE DCIS.  The various methods of treatment have been discussed with the patient and family. After consideration of risks, benefits and other options for treatment, the patient has consented to  Procedure(s): LEFT BREAST LUMPECTOMY WITH RADIOACTIVE SEED AND SENTINEL LYMPH NODE BIOPSY (Left) as a surgical intervention.  The patient's history has been reviewed, patient examined, no change in status, stable for surgery.  I have reviewed the patient's chart and labs.  Questions were answered to the patient's satisfaction.     [name]

## 2023-01-12 NOTE — Op Note (Signed)
Pre-op Diagnosis:  Left breast invasive ductal carcinoma Post-op Diagnosis: same Procedure:  Left radioactive seed localized lumpectomy and sentinel lymph node biopsy  Surgeon:  Damari Hiltz K. Anesthesia:  GEN - LMA/ PEC block Indications:  This is a 67 year old female with a history of seizure disorder on Dilantin. The patient lives independently but had a seizure about 3 weeks ago. She stopped smoking about 5 years ago. The patient has a family history of breast cancer in her mother. Recently, the patient switched primary care physicians. At her initial visit with her new primary care physician, she was noted to have a breast mass. Mammogram and ultrasound were performed. This revealed a 1.1 x 1.0 x 1.0 irregular mass located in the left breast at 3:00, 5 cm from the nipple. 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 The axilla was negative on ultrasound. She presents now for lumpectomy and sentinel lymph node biopsy.  Description of procedure: The patient is brought to the operating room placed in supine position on the operating room table. After an adequate level of general anesthesia was obtained, her left breast and axilla were  prepped with ChloraPrep and draped in sterile fashion. A timeout was taken to ensure the proper patient and proper procedure. We interrogated the breast with the neoprobe. We made a transverse incision over the area of radioactivity in the lateral breast after infiltrating with 0.25% Marcaine. Dissection was carried down in the breast tissue with cautery. We used the neoprobe to guide [place] towards the radioactive seed. We excised an area of tissue around the radioactive seed 3 cm in diameter. The specimen was removed and was oriented with a paint kit. Specimen mammogram showed the radioactive seed as well as the biopsy clip within the specimen. This was sent for pathologic examination. There is no residual  radioactivity within the biopsy cavity. Clips were placed in all five margins.  We inspected carefully for hemostasis. The wound was thoroughly irrigated.   We then turned our attention to the axilla.  The settings were adjusted on the Neoprobe and we interrogated the axilla.  I made a transverse incision over the area of activity.  We also interrogated the axilla with the Sentimag probe, but no activity was noted using the Magtrace.  We dissected into the axilla and identified a radioactive lymph node.   This was dissected free and sent as "sentinel lymph node #1".  There was minimal background activity.  The wounds were closed with a deep layer of 3-0 Vicryl and a subcuticular layer of 4-0 Monocryl. Benzoin Steri-Strips were applied. The patient was then extubated and brought to the recovery room in stable condition. All sponge, instrument, and needle counts are correct.   Wilmon Arms. Corliss Skains, MD, [facility]  General Surgery   01/12/2023 11:17 AM

## 2023-01-12 NOTE — Discharge Instructions (Addendum)
[facility] Office Phone Number [phone]  BREAST BIOPSY/ PARTIAL MASTECTOMY: POST OP INSTRUCTIONS  Always review your discharge instruction sheet given to you by the facility where your surgery was performed.  IF YOU HAVE DISABILITY OR FAMILY LEAVE FORMS, YOU MUST BRING THEM TO THE OFFICE FOR PROCESSING.  DO NOT GIVE THEM TO YOUR DOCTOR.  A prescription for pain medication may be given to you upon discharge.  Take your pain medication as prescribed, if needed.  If narcotic pain medicine is not needed, then you may take acetaminophen (Tylenol) or ibuprofen (Advil) as needed. No Tylenol until after 3:15pm today, if needed. Take your usually prescribed medications unless otherwise directed If you need a refill on your pain medication, please contact your pharmacy.  They will contact our office to request authorization.  Prescriptions will not be filled after 5pm or on week-ends. You should eat very light the first 24 hours after surgery, such as soup, crackers, pudding, etc.  Resume your normal diet the day after surgery. Most patients will experience some swelling and bruising in the breast.  Ice packs and a good support bra will help.  Swelling and bruising can take several days to resolve.  It is common to experience some constipation if taking pain medication after surgery.  Increasing fluid intake and taking a stool softener will usually help or prevent this problem from occurring.  A mild laxative (Milk of Magnesia or Miralax) should be taken according to package directions if there are no bowel movements after 48 hours. Unless discharge instructions indicate otherwise, you may remove your bandages 24-48 hours after surgery, and you may shower at that time.  You may have steri-strips (small skin tapes) in place directly over the incision.  These strips should be left on the skin for 7-10 days.  If your surgeon used skin glue on the incision, you may shower in 24 hours.  The glue  will flake off over the next 2-3 weeks.  Any sutures or staples will be removed at the office during your follow-up visit. ACTIVITIES:  You may resume regular daily activities (gradually increasing) beginning the next day.  Wearing a good support bra or sports bra minimizes pain and swelling.  You may have sexual intercourse when it is comfortable. You may drive when you no longer are taking prescription pain medication, you can comfortably wear a seatbelt, and you can safely maneuver your car and apply brakes. RETURN TO WORK:  ______________________________________________________________________________________ [name] should see your doctor in the office for a follow-up appointment approximately two weeks after your surgery.  Your doctor's nurse will typically make your follow-up appointment when she calls you with your pathology report.  Expect your pathology report 2-3 business days after your surgery.  You may call to check if you do not hear from [place] after three days. OTHER INSTRUCTIONS: _______________________________________________________________________________________________ _____________________________________________________________________________________________________________________________________ _____________________________________________________________________________________________________________________________________ _____________________________________________________________________________________________________________________________________  WHEN TO CALL YOUR DOCTOR: Fever over 101.0 Nausea and/or vomiting. Extreme swelling or bruising. Continued bleeding from incision. Increased pain, redness, or drainage from the incision.  The clinic staff is available to answer your questions during regular business hours.  Please don't hesitate to call and ask to speak to one of the nurses for clinical concerns.  If you have a medical emergency, go to the nearest emergency  room or call 911.  A surgeon from [facility] is always on call at the hospital.  For further questions, please visit [email or web address]    Post Anesthesia Home Care Instructions  Activity:  Get plenty of rest for the remainder of the day. A responsible individual must stay with you for 24 hours following the procedure.  For the next 24 hours, DO NOT: -Drive a car -Advertising copywriter -Drink alcoholic beverages -Take any medication unless instructed by your physician -Make any legal decisions or sign important papers.  Meals: Start with liquid foods such as gelatin or soup. Progress to regular foods as tolerated. Avoid greasy, spicy, heavy foods. If nausea and/or vomiting occur, drink only clear liquids until the nausea and/or vomiting subsides. Call your physician if vomiting continues.  Special Instructions/Symptoms: Your throat may feel dry or sore from the anesthesia or the breathing tube placed in your throat during surgery. If this causes discomfort, gargle with warm salt water. The discomfort should disappear within 24 hours.  If you had a scopolamine patch placed behind your ear for the management of post- operative nausea and/or vomiting:  1. The medication in the patch is effective for 72 hours, after which it should be removed.  Wrap patch in a tissue and discard in the trash. Wash hands thoroughly with soap and water. 2. You may remove the patch earlier than 72 hours if you experience unpleasant side effects which may include dry mouth, dizziness or visual disturbances. 3. Avoid touching the patch. Wash your hands with soap and water after contact with the patch.   Information for Discharge Teaching: EXPAREL (bupivacaine liposome injectable suspension)   Your surgeon or anesthesiologist gave you EXPAREL(bupivacaine) to help control your pain after surgery.  EXPAREL is a local anesthetic that provides pain relief by numbing the tissue around the surgical  site. EXPAREL is designed to release pain medication over time and can control pain for up to 72 hours. Depending on how you respond to EXPAREL, you may require less pain medication during your recovery.  Possible side effects: Temporary loss of sensation or ability to move in the area where bupivacaine was injected. Nausea, vomiting, constipation Rarely, numbness and tingling in your mouth or lips, lightheadedness, or anxiety may occur. Call your doctor right away if you think you may be experiencing any of these sensations, or if you have other questions regarding possible side effects.  Follow all other discharge instructions given to you by your surgeon or nurse. Eat a healthy diet and drink plenty of water or other fluids.  If you return to the hospital for any reason within 96 hours following the administration of EXPAREL, it is important for health care providers to know that you have received this anesthetic. A teal colored band has been placed on your arm with the date, time and amount of EXPAREL you have received in order to alert and inform your health care providers. Please leave this armband in place for the full 96 hours following administration, and then you may remove the band.

## 2023-01-12 NOTE — Anesthesia Postprocedure Evaluation (Signed)
Anesthesia Post Note  Patient: GEORGENA WEISHEIT  Procedure(s) Performed: LEFT BREAST LUMPECTOMY WITH RADIOACTIVE SEED AND SENTINEL LYMPH NODE BIOPSY (Left: Breast)     Patient location during evaluation: PACU Anesthesia Type: General Level of consciousness: awake and alert, oriented and patient cooperative Pain management: pain level controlled Vital Signs Assessment: post-procedure vital signs reviewed and stable Respiratory status: spontaneous breathing, nonlabored ventilation and respiratory function stable Cardiovascular status: blood pressure returned to baseline and stable Postop Assessment: no apparent nausea or vomiting Anesthetic complications: no   No notable events documented.  Last Vitals:  Vitals:   01/12/23 1145 01/12/23 1200  BP: (!) 115/58 (!) 101/50  Pulse: 77 75  Resp: 17 15  Temp:    SpO2: 100% 100%    Last Pain:  Vitals:   01/12/23 1145  TempSrc:   PainSc: 0-No pain                 Lannie Fields

## 2023-01-12 NOTE — Progress Notes (Signed)
Emotional support provided through nuclear medicine breast injections. Vital signs stable. Patient tolerated well.  

## 2023-01-12 NOTE — Anesthesia Procedure Notes (Signed)
Anesthesia Regional Block: Pectoralis block   Pre-Anesthetic Checklist: , timeout performed,  Correct Patient, Correct Site, Correct Laterality,  Correct Procedure, Correct Position, site marked,  Risks and benefits discussed,  Surgical consent,  Pre-op evaluation,  At surgeon's request and post-op pain management  Laterality: Left  Prep: Maximum Sterile Barrier Precautions used, chloraprep       Needles:  Injection technique: Single-shot  Needle Type: Echogenic Stimulator Needle     Needle Length: 9cm  Needle Gauge: 22     Additional Needles:   Procedures:,,,, ultrasound used (permanent image in chart),,    Narrative:  Start time: 01/12/2023 10:00 AM End time: 01/12/2023 10:03 AM Injection made incrementally with aspirations every 5 mL.  Performed by: Personally  Anesthesiologist: Lannie Fields, DO  Additional Notes: Monitors applied. No increased pain on injection. No increased resistance to injection. Injection made in 5cc increments. Good needle visualization. Patient tolerated procedure well.

## 2023-01-12 NOTE — Transfer of Care (Signed)
Immediate Anesthesia Transfer of Care Note  Patient: [name]  Procedure(s) Performed: LEFT BREAST LUMPECTOMY WITH RADIOACTIVE SEED AND SENTINEL LYMPH NODE BIOPSY (Left: Breast)  Patient Location: PACU  Anesthesia Type:General  Level of Consciousness: sedated  Airway & Oxygen Therapy: Patient Spontanous Breathing and Patient connected to face mask oxygen  Post-op Assessment: Report given to RN and Post -op Vital signs reviewed and stable  Post vital signs: Reviewed and stable  Last Vitals:  Vitals Value Taken Time  BP 117/54 01/12/23 1117  Temp    Pulse 73 01/12/23 1119  Resp    SpO2 100 % 01/12/23 1119  Vitals shown include unvalidated device data.  Last Pain:  Vitals:   01/12/23 0906  TempSrc: Oral  PainSc: 0-No pain      Patients Stated Pain Goal: 4 (01/12/23 0906)  Complications: No notable events documented.

## 2023-01-12 NOTE — Progress Notes (Signed)
Assisted Dr. Finucane with left, pectoralis, ultrasound guided block. Side rails up, monitors on throughout procedure. See vital signs in flow sheet. Tolerated Procedure well. 

## 2023-01-13 ENCOUNTER — Encounter (HOSPITAL_BASED_OUTPATIENT_CLINIC_OR_DEPARTMENT_OTHER): Payer: Self-pay | Admitting: Surgery

## 2023-01-13 ENCOUNTER — Telehealth: Payer: Self-pay

## 2023-01-13 NOTE — Telephone Encounter (Signed)
[facility] called back given verbal orders to stop start physical therapy with pt new insurance

## 2023-01-14 ENCOUNTER — Telehealth: Payer: Self-pay | Admitting: Neurology

## 2023-01-14 DIAGNOSIS — G5793 Unspecified mononeuropathy of bilateral lower limbs: Secondary | ICD-10-CM | POA: Diagnosis not present

## 2023-01-14 DIAGNOSIS — C50412 Malignant neoplasm of upper-outer quadrant of left female breast: Secondary | ICD-10-CM | POA: Diagnosis not present

## 2023-01-14 DIAGNOSIS — I10 Essential (primary) hypertension: Secondary | ICD-10-CM | POA: Diagnosis not present

## 2023-01-14 DIAGNOSIS — Z7901 Long term (current) use of anticoagulants: Secondary | ICD-10-CM | POA: Diagnosis not present

## 2023-01-14 DIAGNOSIS — K5900 Constipation, unspecified: Secondary | ICD-10-CM | POA: Diagnosis not present

## 2023-01-14 DIAGNOSIS — Z9181 History of falling: Secondary | ICD-10-CM | POA: Diagnosis not present

## 2023-01-14 DIAGNOSIS — F419 Anxiety disorder, unspecified: Secondary | ICD-10-CM | POA: Diagnosis not present

## 2023-01-14 DIAGNOSIS — Z87891 Personal history of nicotine dependence: Secondary | ICD-10-CM | POA: Diagnosis not present

## 2023-01-14 DIAGNOSIS — D630 Anemia in neoplastic disease: Secondary | ICD-10-CM | POA: Diagnosis not present

## 2023-01-14 DIAGNOSIS — J4489 Other specified chronic obstructive pulmonary disease: Secondary | ICD-10-CM | POA: Diagnosis not present

## 2023-01-14 DIAGNOSIS — D0502 Lobular carcinoma in situ of left breast: Secondary | ICD-10-CM | POA: Diagnosis not present

## 2023-01-14 DIAGNOSIS — G40219 Localization-related (focal) (partial) symptomatic epilepsy and epileptic syndromes with complex partial seizures, intractable, without status epilepticus: Secondary | ICD-10-CM | POA: Diagnosis not present

## 2023-01-14 DIAGNOSIS — N390 Urinary tract infection, site not specified: Secondary | ICD-10-CM | POA: Diagnosis not present

## 2023-01-14 DIAGNOSIS — Z8616 Personal history of COVID-19: Secondary | ICD-10-CM | POA: Diagnosis not present

## 2023-01-14 DIAGNOSIS — M48061 Spinal stenosis, lumbar region without neurogenic claudication: Secondary | ICD-10-CM | POA: Diagnosis not present

## 2023-01-14 DIAGNOSIS — G9389 Other specified disorders of brain: Secondary | ICD-10-CM | POA: Diagnosis not present

## 2023-01-14 DIAGNOSIS — I480 Paroxysmal atrial fibrillation: Secondary | ICD-10-CM | POA: Diagnosis not present

## 2023-01-14 DIAGNOSIS — Z170 Estrogen receptor positive status [ER+]: Secondary | ICD-10-CM | POA: Diagnosis not present

## 2023-01-14 DIAGNOSIS — I82A11 Acute embolism and thrombosis of right axillary vein: Secondary | ICD-10-CM | POA: Diagnosis not present

## 2023-01-14 NOTE — Telephone Encounter (Signed)
New message    Need verbal order for one time for six week for physical therapy

## 2023-01-14 NOTE — Telephone Encounter (Signed)
Pt called with verbal order for one time for six week for physical therapy

## 2023-01-15 ENCOUNTER — Telehealth: Payer: Self-pay | Admitting: Genetic Counselor

## 2023-01-15 ENCOUNTER — Ambulatory Visit: Payer: Self-pay | Admitting: Genetic Counselor

## 2023-01-15 DIAGNOSIS — Z1379 Encounter for other screening for genetic and chromosomal anomalies: Secondary | ICD-10-CM

## 2023-01-15 NOTE — Progress Notes (Addendum)
HPI:  Ms. Schoenwald was previously seen in the [facility] due to a personal and family history of cancer and concerns regarding a hereditary predisposition to cancer. Please refer to our prior cancer genetics clinic note for more information regarding our discussion, assessment and recommendations, at the time. Ms. Sobh recent genetic test results were disclosed to her, as were recommendations warranted by these results. These results and recommendations are discussed in more detail below.  CANCER HISTORY:  Oncology History  Malignant neoplasm of upper-outer quadrant of left breast in female, estrogen receptor positive (HCC)  12/15/2022 Mammogram   IMPRESSION: 1. Suspicious 1.1 cm OUTER LEFT breast mass. Tissue sampling is recommended. No abnormal appearing LEFT axillary lymph nodes. 2. No other significant mammographic findings within either breast.   12/22/2022 Pathology Results   Pathology showed grade 2 IDC, ER 95% moderate-strong staining, PR neg. Her 2 neg.   12/30/2022 Initial Diagnosis   Malignant neoplasm of upper-outer quadrant of left breast in female, estrogen receptor positive (HCC)   12/31/2022 Cancer Staging   Staging form: Breast, AJCC 8th Edition - Clinical stage from 12/31/2022: Stage IA (cT1c, cN0, cM0, G2, ER+, PR-, HER2-) - Signed by [name], MD on 12/31/2022 Stage prefix: Initial diagnosis Histologic grading system: 3 grade system Laterality: Left Staged by: Pathologist and managing physician Stage used in treatment planning: Yes National guidelines used in treatment planning: Yes Type of national guideline used in treatment planning: NCCN   01/08/2023 Genetic Testing   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  The report date is January 08, 2023.  The Multi-Cancer + RNA Panel offered by [facility] includes sequencing and/or deletion/duplication analysis of the following 70 genes:   AIP*, ALK, APC*, ATM*, AXIN2*, BAP1*, BARD1*, BLM*, BMPR1A*, BRCA1*, BRCA2*, BRIP1*, CDC73*, CDH1*, CDK4, CDKN1B*, CDKN2A, CHEK2*, CTNNA1*, DICER1*, EPCAM (del/dup only), EGFR, FH*, FLCN*, GREM1 (promoter dup only), HOXB13, KIT, LZTR1, MAX*, MBD4, MEN1*, MET, MITF, MLH1*, MSH2*, MSH3*, MSH6*, MUTYH*, NF1*, NF2*, NTHL1*, PALB2*, PDGFRA, PMS2*, POLD1*, POLE*, POT1*, PRKAR1A*, PTCH1*, PTEN*, RAD51C*, RAD51D*, RB1*, RET, SDHA* (sequencing only), SDHAF2*, SDHB*, SDHC*, SDHD*, SMAD4*, SMARCA4*, SMARCB1*, SMARCE1*, STK11*, SUFU*, TMEM127*, TP53*, TSC1*, TSC2*, VHL*. RNA analysis is performed for * genes.      FAMILY HISTORY:  We obtained a detailed, 4-generation family history.  Significant diagnoses are listed below: Family History  Problem Relation Age of Onset   Colon cancer Mother        started as vaginal cancer   Heart attack Mother        4 MIs, started in her 48s, also vaginal cancer and colon cancer   Cancer Mother    Heart attack Father        Died suddenly age 61 - autopsy revealed heart attack her patient   Cancer Brother        Sinus cancer   Cervical cancer Maternal Aunt    Rectal cancer Neg Hx    Stomach cancer Neg Hx    Esophageal cancer Neg Hx      The patient has three children, one is deceased.  She has a brother and sister who are cancer free.  Her parents are both deceased.   Her father died of a heart attack at 59.  He had five siblings who did not have cancer.  His parents are deceased.   The patient's mother had vaginal cancer there reportedly went to the colon.  She had one sister who had  cervical cancer and 4 brothers who were cancer free.     Ms. Gibbon is unaware of previous family history of genetic testing for hereditary cancer risks. There is no reported Ashkenazi Jewish ancestry. There is no known consanguinity  GENETIC TEST RESULTS: Genetic testing reported out on January 08, 2023 through the Multi-cancer + RNA cancer panel found no pathogenic mutations. The  Multi-Cancer + RNA Panel offered by [facility] includes sequencing and/or deletion/duplication analysis of the following 70 genes:  AIP*, ALK, APC*, ATM*, AXIN2*, BAP1*, BARD1*, BLM*, BMPR1A*, BRCA1*, BRCA2*, BRIP1*, CDC73*, CDH1*, CDK4, CDKN1B*, CDKN2A, CHEK2*, CTNNA1*, DICER1*, EPCAM (del/dup only), EGFR, FH*, FLCN*, GREM1 (promoter dup only), HOXB13, KIT, LZTR1, MAX*, MBD4, MEN1*, MET, MITF, MLH1*, MSH2*, MSH3*, MSH6*, MUTYH*, NF1*, NF2*, NTHL1*, PALB2*, PDGFRA, PMS2*, POLD1*, POLE*, POT1*, PRKAR1A*, PTCH1*, PTEN*, RAD51C*, RAD51D*, RB1*, RET, SDHA* (sequencing only), SDHAF2*, SDHB*, SDHC*, SDHD*, SMAD4*, SMARCA4*, SMARCB1*, SMARCE1*, STK11*, SUFU*, TMEM127*, TP53*, TSC1*, TSC2*, VHL*. RNA analysis is performed for * genes. The test report has been scanned into EPIC and is located under the Molecular Pathology section of the Results Review tab.  A portion of the result report is included below for reference.     We discussed with Ms. Mandarino that because current genetic testing is not perfect, it is possible there may be a gene mutation in one of these genes that current testing cannot detect, but that chance is small.  We also discussed, that there could be another gene that has not yet been discovered, or that we have not yet tested, that is responsible for the cancer diagnoses in the family. It is also possible there is a hereditary cause for the cancer in the family that Ms. Zylka did not inherit and therefore was not identified in her testing.  Therefore, it is important to remain in touch with cancer genetics in the future so that we can continue to offer Ms. Basista the most up to date genetic testing.   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 However, this result  should not be considered definitive proof of mosaicism; it is not always possible to differentiate between constitutional mosaicism, somatic mosaicism, or other bias in the sequencing technology. At this time, it is unknown if this variant is associated with increased cancer risk or if this is a normal finding, but most variants such as this get reclassified to being inconsequential. It should not be used to make medical management decisions. With time, we suspect the lab will determine the significance of this variant, if any. If we do learn more about it, we will try to contact @M @ @LNAME @ to discuss it further. However, it is important to stay in touch with [place] periodically and keep the address and phone number up to date.  ADDITIONAL GENETIC TESTING: We discussed with Ms. Andalon that her genetic testing was fairly extensive.  If there are genes identified to increase cancer risk that can be analyzed in the future, we would be happy to discuss and coordinate this testing at that time.    CANCER SCREENING RECOMMENDATIONS: Ms. Apperson test result is considered negative (normal).  This means that we have not identified a hereditary cause for her personal and family history of cancer at this time. Most cancers happen by chance and this negative  test suggests that her cancer may fall into this category.    Possible reasons for Ms. Loncar's negative genetic test include:  1. There may be a gene mutation in one of these genes that current testing methods cannot detect but that chance is small.  2. There could be another gene that has not yet been discovered, or that we have not yet tested, that is responsible for the cancer diagnoses in the family.  3.  There may be no hereditary risk for cancer in the family. The cancers in Ms. Wintermute and/or her family may be sporadic/familial or due to other genetic and environmental factors. 4. It is also possible there is a hereditary cause for the cancer in the family  that Ms. Copen did not inherit.  Therefore, it is recommended she continue to follow the cancer management and screening guidelines provided by her oncology and primary healthcare provider. An individual's cancer risk and medical management are not determined by genetic test results alone. Overall cancer risk assessment incorporates additional factors, including personal medical history, family history, and any available genetic information that may result in a personalized plan for cancer prevention and surveillance  RECOMMENDATIONS FOR FAMILY MEMBERS:  Individuals in this family might be at some increased risk of developing cancer, over the general population risk, simply due to the family history of cancer.  We recommended women in this family have a yearly mammogram beginning at age 8, or 102 years younger than the earliest onset of cancer, an annual clinical breast exam, and perform monthly breast self-exams. Women in this family should also have a gynecological exam as recommended by their primary provider. All family members should be referred for colonoscopy starting at age 72.  FOLLOW-UP: Lastly, we discussed with Ms. Valcarcel that cancer genetics is a rapidly advancing field and it is possible that new genetic tests will be appropriate for her and/or her family members in the future. We encouraged her to remain in contact with cancer genetics on an annual basis so we can update her personal and family histories and let her know of advances in cancer genetics that may benefit this family.   Our contact number was provided. Ms. Degree questions were answered to her satisfaction, and she knows she is welcome to call us at anytime with additional questions or concerns.   Maylon Cos, MS, [facility] Licensed, Certified Genetic Counselor [email or web address]

## 2023-01-15 NOTE — Telephone Encounter (Signed)
Revealed negative genetic testing.  Discussed that we do not know why she has breast cancer or why there is cancer in the family. It could be due to a different gene that we are not testing, or maybe our current technology may not be able to pick something up.  It will be important for her to keep in contact with genetics to keep up with whether additional testing may be needed. 

## 2023-01-16 LAB — SURGICAL PATHOLOGY

## 2023-01-19 ENCOUNTER — Encounter: Payer: Self-pay | Admitting: *Deleted

## 2023-01-19 ENCOUNTER — Telehealth: Payer: Self-pay | Admitting: *Deleted

## 2023-01-19 NOTE — Telephone Encounter (Signed)
Received order for oncotype testing. Requisition sent to pathology. 

## 2023-01-20 DIAGNOSIS — G9389 Other specified disorders of brain: Secondary | ICD-10-CM | POA: Diagnosis not present

## 2023-01-20 DIAGNOSIS — N390 Urinary tract infection, site not specified: Secondary | ICD-10-CM | POA: Diagnosis not present

## 2023-01-20 DIAGNOSIS — J4489 Other specified chronic obstructive pulmonary disease: Secondary | ICD-10-CM | POA: Diagnosis not present

## 2023-01-20 DIAGNOSIS — G5793 Unspecified mononeuropathy of bilateral lower limbs: Secondary | ICD-10-CM | POA: Diagnosis not present

## 2023-01-20 DIAGNOSIS — Z87891 Personal history of nicotine dependence: Secondary | ICD-10-CM | POA: Diagnosis not present

## 2023-01-20 DIAGNOSIS — Z9181 History of falling: Secondary | ICD-10-CM | POA: Diagnosis not present

## 2023-01-20 DIAGNOSIS — M48061 Spinal stenosis, lumbar region without neurogenic claudication: Secondary | ICD-10-CM | POA: Diagnosis not present

## 2023-01-20 DIAGNOSIS — G40219 Localization-related (focal) (partial) symptomatic epilepsy and epileptic syndromes with complex partial seizures, intractable, without status epilepticus: Secondary | ICD-10-CM | POA: Diagnosis not present

## 2023-01-26 DIAGNOSIS — Z9181 History of falling: Secondary | ICD-10-CM | POA: Diagnosis not present

## 2023-01-26 DIAGNOSIS — Z87891 Personal history of nicotine dependence: Secondary | ICD-10-CM | POA: Diagnosis not present

## 2023-01-26 DIAGNOSIS — G40219 Localization-related (focal) (partial) symptomatic epilepsy and epileptic syndromes with complex partial seizures, intractable, without status epilepticus: Secondary | ICD-10-CM | POA: Diagnosis not present

## 2023-01-26 DIAGNOSIS — C50412 Malignant neoplasm of upper-outer quadrant of left female breast: Secondary | ICD-10-CM | POA: Diagnosis not present

## 2023-01-26 DIAGNOSIS — G9389 Other specified disorders of brain: Secondary | ICD-10-CM | POA: Diagnosis not present

## 2023-01-26 DIAGNOSIS — Z170 Estrogen receptor positive status [ER+]: Secondary | ICD-10-CM | POA: Diagnosis not present

## 2023-01-26 DIAGNOSIS — M48061 Spinal stenosis, lumbar region without neurogenic claudication: Secondary | ICD-10-CM | POA: Diagnosis not present

## 2023-01-26 DIAGNOSIS — G5793 Unspecified mononeuropathy of bilateral lower limbs: Secondary | ICD-10-CM | POA: Diagnosis not present

## 2023-01-26 DIAGNOSIS — J4489 Other specified chronic obstructive pulmonary disease: Secondary | ICD-10-CM | POA: Diagnosis not present

## 2023-01-26 DIAGNOSIS — N390 Urinary tract infection, site not specified: Secondary | ICD-10-CM | POA: Diagnosis not present

## 2023-01-27 ENCOUNTER — Encounter (HOSPITAL_COMMUNITY): Payer: Self-pay

## 2023-01-30 ENCOUNTER — Telehealth: Payer: Self-pay | Admitting: *Deleted

## 2023-01-30 ENCOUNTER — Encounter: Payer: Self-pay | Admitting: *Deleted

## 2023-01-30 DIAGNOSIS — C50412 Malignant neoplasm of upper-outer quadrant of left female breast: Secondary | ICD-10-CM

## 2023-01-30 NOTE — Telephone Encounter (Signed)
Received oncotype results of 19/6%. Referral placed for Dr. Roselind Messier.

## 2023-02-02 ENCOUNTER — Telehealth: Payer: Self-pay | Admitting: Radiation Oncology

## 2023-02-02 NOTE — Telephone Encounter (Signed)
LVM to mobile number. Spoke to pt on home number who states her daughter makes all her appts due to pt not driving, pt advised she would notify daughter to c/b to make appt.

## 2023-02-03 ENCOUNTER — Telehealth: Payer: Self-pay | Admitting: Radiation Oncology

## 2023-02-03 NOTE — Telephone Encounter (Signed)
LVM for daughter to schedule CON/SIM with Dr. Roselind Messier for pt  per pt request

## 2023-02-06 DIAGNOSIS — G40219 Localization-related (focal) (partial) symptomatic epilepsy and epileptic syndromes with complex partial seizures, intractable, without status epilepticus: Secondary | ICD-10-CM | POA: Diagnosis not present

## 2023-02-06 DIAGNOSIS — G5793 Unspecified mononeuropathy of bilateral lower limbs: Secondary | ICD-10-CM | POA: Diagnosis not present

## 2023-02-06 DIAGNOSIS — Z87891 Personal history of nicotine dependence: Secondary | ICD-10-CM | POA: Diagnosis not present

## 2023-02-06 DIAGNOSIS — G9389 Other specified disorders of brain: Secondary | ICD-10-CM | POA: Diagnosis not present

## 2023-02-06 DIAGNOSIS — N390 Urinary tract infection, site not specified: Secondary | ICD-10-CM | POA: Diagnosis not present

## 2023-02-06 DIAGNOSIS — M48061 Spinal stenosis, lumbar region without neurogenic claudication: Secondary | ICD-10-CM | POA: Diagnosis not present

## 2023-02-06 DIAGNOSIS — Z9181 History of falling: Secondary | ICD-10-CM | POA: Diagnosis not present

## 2023-02-06 DIAGNOSIS — J4489 Other specified chronic obstructive pulmonary disease: Secondary | ICD-10-CM | POA: Diagnosis not present

## 2023-02-09 ENCOUNTER — Encounter: Payer: Self-pay | Admitting: Neurology

## 2023-02-09 ENCOUNTER — Inpatient Hospital Stay (HOSPITAL_COMMUNITY)
Admission: EM | Admit: 2023-02-09 | Discharge: 2023-02-19 | DRG: 101 | Disposition: A | Discharge status: Rehabilitation Facility | Payer: Medicare HMO | Attending: Internal Medicine | Admitting: Internal Medicine

## 2023-02-09 ENCOUNTER — Observation Stay (HOSPITAL_COMMUNITY): Payer: Medicare HMO

## 2023-02-09 ENCOUNTER — Other Ambulatory Visit: Payer: Self-pay

## 2023-02-09 ENCOUNTER — Encounter (HOSPITAL_COMMUNITY): Payer: Self-pay | Admitting: Internal Medicine

## 2023-02-09 ENCOUNTER — Emergency Department (HOSPITAL_COMMUNITY): Payer: Medicare HMO

## 2023-02-09 ENCOUNTER — Telehealth: Payer: Self-pay

## 2023-02-09 ENCOUNTER — Encounter: Payer: Self-pay | Admitting: *Deleted

## 2023-02-09 ENCOUNTER — Ambulatory Visit: Payer: Medicare HMO

## 2023-02-09 DIAGNOSIS — I82A11 Acute embolism and thrombosis of right axillary vein: Secondary | ICD-10-CM | POA: Diagnosis not present

## 2023-02-09 DIAGNOSIS — R000 Tachycardia, unspecified: Secondary | ICD-10-CM | POA: Diagnosis not present

## 2023-02-09 DIAGNOSIS — I10 Essential (primary) hypertension: Secondary | ICD-10-CM | POA: Diagnosis present

## 2023-02-09 DIAGNOSIS — Z8249 Family history of ischemic heart disease and other diseases of the circulatory system: Secondary | ICD-10-CM

## 2023-02-09 DIAGNOSIS — Z885 Allergy status to narcotic agent status: Secondary | ICD-10-CM

## 2023-02-09 DIAGNOSIS — R911 Solitary pulmonary nodule: Secondary | ICD-10-CM | POA: Diagnosis present

## 2023-02-09 DIAGNOSIS — E1142 Type 2 diabetes mellitus with diabetic polyneuropathy: Secondary | ICD-10-CM | POA: Diagnosis present

## 2023-02-09 DIAGNOSIS — R509 Fever, unspecified: Secondary | ICD-10-CM | POA: Diagnosis not present

## 2023-02-09 DIAGNOSIS — R569 Unspecified convulsions: Principal | ICD-10-CM

## 2023-02-09 DIAGNOSIS — E8809 Other disorders of plasma-protein metabolism, not elsewhere classified: Secondary | ICD-10-CM | POA: Diagnosis present

## 2023-02-09 DIAGNOSIS — G40909 Epilepsy, unspecified, not intractable, without status epilepticus: Secondary | ICD-10-CM | POA: Diagnosis not present

## 2023-02-09 DIAGNOSIS — D696 Thrombocytopenia, unspecified: Secondary | ICD-10-CM | POA: Diagnosis present

## 2023-02-09 DIAGNOSIS — I69854 Hemiplegia and hemiparesis following other cerebrovascular disease affecting left non-dominant side: Secondary | ICD-10-CM

## 2023-02-09 DIAGNOSIS — H5340 Unspecified visual field defects: Secondary | ICD-10-CM | POA: Diagnosis present

## 2023-02-09 DIAGNOSIS — E441 Mild protein-calorie malnutrition: Secondary | ICD-10-CM | POA: Diagnosis present

## 2023-02-09 DIAGNOSIS — Z95828 Presence of other vascular implants and grafts: Secondary | ICD-10-CM

## 2023-02-09 DIAGNOSIS — Z91018 Allergy to other foods: Secondary | ICD-10-CM

## 2023-02-09 DIAGNOSIS — R0689 Other abnormalities of breathing: Secondary | ICD-10-CM | POA: Diagnosis not present

## 2023-02-09 DIAGNOSIS — N393 Stress incontinence (female) (male): Secondary | ICD-10-CM | POA: Diagnosis present

## 2023-02-09 DIAGNOSIS — T4276XA Underdosing of unspecified antiepileptic and sedative-hypnotic drugs, initial encounter: Secondary | ICD-10-CM | POA: Diagnosis present

## 2023-02-09 DIAGNOSIS — G8929 Other chronic pain: Secondary | ICD-10-CM | POA: Diagnosis present

## 2023-02-09 DIAGNOSIS — R0902 Hypoxemia: Secondary | ICD-10-CM | POA: Diagnosis not present

## 2023-02-09 DIAGNOSIS — K219 Gastro-esophageal reflux disease without esophagitis: Secondary | ICD-10-CM | POA: Diagnosis present

## 2023-02-09 DIAGNOSIS — G4700 Insomnia, unspecified: Secondary | ICD-10-CM | POA: Diagnosis not present

## 2023-02-09 DIAGNOSIS — Z8673 Personal history of transient ischemic attack (TIA), and cerebral infarction without residual deficits: Secondary | ICD-10-CM | POA: Diagnosis not present

## 2023-02-09 DIAGNOSIS — G629 Polyneuropathy, unspecified: Secondary | ICD-10-CM | POA: Diagnosis not present

## 2023-02-09 DIAGNOSIS — C50412 Malignant neoplasm of upper-outer quadrant of left female breast: Secondary | ICD-10-CM | POA: Diagnosis not present

## 2023-02-09 DIAGNOSIS — Z87820 Personal history of traumatic brain injury: Secondary | ICD-10-CM

## 2023-02-09 DIAGNOSIS — R451 Restlessness and agitation: Secondary | ICD-10-CM | POA: Diagnosis not present

## 2023-02-09 DIAGNOSIS — Z86711 Personal history of pulmonary embolism: Secondary | ICD-10-CM

## 2023-02-09 DIAGNOSIS — Z170 Estrogen receptor positive status [ER+]: Secondary | ICD-10-CM

## 2023-02-09 DIAGNOSIS — E785 Hyperlipidemia, unspecified: Secondary | ICD-10-CM | POA: Diagnosis present

## 2023-02-09 DIAGNOSIS — I480 Paroxysmal atrial fibrillation: Secondary | ICD-10-CM | POA: Diagnosis not present

## 2023-02-09 DIAGNOSIS — Z681 Body mass index (BMI) 19 or less, adult: Secondary | ICD-10-CM

## 2023-02-09 DIAGNOSIS — R404 Transient alteration of awareness: Secondary | ICD-10-CM | POA: Diagnosis not present

## 2023-02-09 DIAGNOSIS — F419 Anxiety disorder, unspecified: Secondary | ICD-10-CM | POA: Diagnosis present

## 2023-02-09 DIAGNOSIS — E876 Hypokalemia: Secondary | ICD-10-CM | POA: Diagnosis present

## 2023-02-09 DIAGNOSIS — G9389 Other specified disorders of brain: Secondary | ICD-10-CM | POA: Diagnosis not present

## 2023-02-09 DIAGNOSIS — J4489 Other specified chronic obstructive pulmonary disease: Secondary | ICD-10-CM | POA: Diagnosis present

## 2023-02-09 DIAGNOSIS — Z743 Need for continuous supervision: Secondary | ICD-10-CM | POA: Diagnosis not present

## 2023-02-09 DIAGNOSIS — R059 Cough, unspecified: Secondary | ICD-10-CM | POA: Diagnosis not present

## 2023-02-09 DIAGNOSIS — Z888 Allergy status to other drugs, medicaments and biological substances status: Secondary | ICD-10-CM

## 2023-02-09 DIAGNOSIS — J449 Chronic obstructive pulmonary disease, unspecified: Secondary | ICD-10-CM | POA: Diagnosis present

## 2023-02-09 DIAGNOSIS — Z8616 Personal history of COVID-19: Secondary | ICD-10-CM

## 2023-02-09 DIAGNOSIS — M542 Cervicalgia: Secondary | ICD-10-CM | POA: Diagnosis present

## 2023-02-09 DIAGNOSIS — I4892 Unspecified atrial flutter: Secondary | ICD-10-CM | POA: Diagnosis not present

## 2023-02-09 DIAGNOSIS — Z91148 Patient's other noncompliance with medication regimen for other reason: Secondary | ICD-10-CM

## 2023-02-09 DIAGNOSIS — Z79899 Other long term (current) drug therapy: Secondary | ICD-10-CM

## 2023-02-09 DIAGNOSIS — M549 Dorsalgia, unspecified: Secondary | ICD-10-CM | POA: Diagnosis present

## 2023-02-09 DIAGNOSIS — Z87891 Personal history of nicotine dependence: Secondary | ICD-10-CM

## 2023-02-09 DIAGNOSIS — Z86718 Personal history of other venous thrombosis and embolism: Secondary | ICD-10-CM

## 2023-02-09 HISTORY — DX: Altered mental status, unspecified: R41.82

## 2023-02-09 LAB — CBC
HCT: 39 % (ref 36.0–46.0)
Hemoglobin: 12.9 g/dL (ref 12.0–15.0)
MCH: 34.2 pg — ABNORMAL HIGH (ref 26.0–34.0)
MCHC: 33.1 g/dL (ref 30.0–36.0)
MCV: 103.4 fL — ABNORMAL HIGH (ref 80.0–100.0)
Platelets: 118 10*3/uL — ABNORMAL LOW (ref 150–400)
RBC: 3.77 MIL/uL — ABNORMAL LOW (ref 3.87–5.11)
RDW: 12.4 % (ref 11.5–15.5)
WBC: 5.4 10*3/uL (ref 4.0–10.5)
nRBC: 0 % (ref 0.0–0.2)

## 2023-02-09 LAB — COMPREHENSIVE METABOLIC PANEL
ALT: 15 U/L (ref 0–44)
AST: 23 U/L (ref 15–41)
Albumin: 3.8 g/dL (ref 3.5–5.0)
Alkaline Phosphatase: 51 U/L (ref 38–126)
Anion gap: 15 (ref 5–15)
BUN: 15 mg/dL (ref 8–23)
CO2: 22 mmol/L (ref 22–32)
Calcium: 9 mg/dL (ref 8.9–10.3)
Chloride: 104 mmol/L (ref 98–111)
Creatinine, Ser: 1.08 mg/dL — ABNORMAL HIGH (ref 0.44–1.00)
GFR, Estimated: 57 mL/min — ABNORMAL LOW (ref >60)
Glucose, Bld: 110 mg/dL — ABNORMAL HIGH (ref 70–99)
Potassium: 3.4 mmol/L — ABNORMAL LOW (ref 3.5–5.1)
Sodium: 141 mmol/L (ref 135–145)
Total Bilirubin: 0.7 mg/dL (ref 0.3–1.2)
Total Protein: 6.4 g/dL — ABNORMAL LOW (ref 6.5–8.1)

## 2023-02-09 LAB — PHENYTOIN LEVEL, TOTAL: Phenytoin Lvl: 5.6 ug/mL — ABNORMAL LOW (ref 10.0–20.0)

## 2023-02-09 MED ORDER — PHENYTOIN SODIUM EXTENDED 100 MG PO CAPS: 100.0000 mg | ORAL_CAPSULE | Freq: Every morning | ORAL | Status: DC

## 2023-02-09 MED ORDER — PHENYTOIN SODIUM EXTENDED 100 MG PO CAPS: 100.0000 mg | ORAL_CAPSULE | Freq: Two times a day (BID) | ORAL | Status: DC

## 2023-02-09 MED ORDER — OXYBUTYNIN CHLORIDE ER 5 MG PO TB24
5.0000 mg | ORAL_TABLET | Freq: Every day | ORAL | Status: DC
  Administered 2023-02-10 – 2023-02-18 (×9): 5 mg via ORAL
  Filled 2023-02-09 (×11): qty 1

## 2023-02-09 MED ORDER — ENOXAPARIN SODIUM 40 MG/0.4ML IJ SOSY
40.0000 mg | PREFILLED_SYRINGE | INTRAMUSCULAR | Status: DC
  Administered 2023-02-09 – 2023-02-17 (×9): 40 mg via SUBCUTANEOUS
  Filled 2023-02-09 (×9): qty 0.4

## 2023-02-09 MED ORDER — PHENYTOIN SODIUM EXTENDED 30 MG PO CAPS: 30.0000 mg | ORAL_CAPSULE | Freq: Every morning | ORAL | Status: DC

## 2023-02-09 MED ORDER — SODIUM CHLORIDE 0.9% FLUSH
3.0000 mL | Freq: Two times a day (BID) | INTRAVENOUS | Status: DC
  Administered 2023-02-09 – 2023-02-10 (×3): 3 mL via INTRAVENOUS

## 2023-02-09 MED ORDER — LACOSAMIDE 50 MG PO TABS
200.0000 mg | ORAL_TABLET | Freq: Two times a day (BID) | ORAL | Status: DC
  Administered 2023-02-10 – 2023-02-19 (×19): 200 mg via ORAL
  Filled 2023-02-09 (×19): qty 4

## 2023-02-09 MED ORDER — VIMPAT 200 MG PO TABS: 200.0000 mg | ORAL_TABLET | Freq: Two times a day (BID) | ORAL | 5 refills | Status: DC

## 2023-02-09 MED ORDER — DILANTIN 30 MG PO CAPS: ORAL_CAPSULE | ORAL | 3 refills | Status: DC

## 2023-02-09 MED ORDER — POLYETHYLENE GLYCOL 3350 17 G PO PACK: 17.0000 g | PACK | Freq: Every day | ORAL | Status: DC | PRN

## 2023-02-09 MED ORDER — ACETAMINOPHEN 325 MG PO TABS
650.0000 mg | ORAL_TABLET | Freq: Four times a day (QID) | ORAL | Status: DC | PRN
  Administered 2023-02-13 – 2023-02-19 (×10): 650 mg via ORAL
  Filled 2023-02-09 (×12): qty 2

## 2023-02-09 MED ORDER — PHENYTOIN SODIUM EXTENDED 30 MG PO CAPS
130.0000 mg | ORAL_CAPSULE | Freq: Every day | ORAL | Status: DC
  Filled 2023-02-09: qty 1

## 2023-02-09 MED ORDER — PHENYTOIN SODIUM EXTENDED 100 MG PO CAPS
100.0000 mg | ORAL_CAPSULE | Freq: Every evening | ORAL | Status: DC
  Administered 2023-02-09: 100 mg via ORAL
  Filled 2023-02-09: qty 1

## 2023-02-09 MED ORDER — SODIUM CHLORIDE 0.9 % IV SOLN
200.0000 mg | Freq: Once | INTRAVENOUS | Status: AC
  Administered 2023-02-09: 200 mg via INTRAVENOUS
  Filled 2023-02-09 (×2): qty 20

## 2023-02-09 MED ORDER — LORAZEPAM 1 MG PO TABS: ORAL_TABLET | ORAL | 5 refills | Status: DC

## 2023-02-09 MED ORDER — LORAZEPAM 2 MG/ML IJ SOLN
INTRAMUSCULAR | Status: AC
  Administered 2023-02-09: 1 mg
  Filled 2023-02-09: qty 1

## 2023-02-09 MED ORDER — PREGABALIN 50 MG PO CAPS: ORAL_CAPSULE | ORAL | 3 refills | Status: DC

## 2023-02-09 MED ORDER — LORAZEPAM 2 MG/ML IJ SOLN
2.0000 mg | INTRAMUSCULAR | Status: AC | PRN
  Administered 2023-02-09 – 2023-02-10 (×2): 2 mg via INTRAVENOUS
  Filled 2023-02-09 (×2): qty 1

## 2023-02-09 MED ORDER — PHENYTOIN SODIUM EXTENDED 30 MG PO CAPS
130.0000 mg | ORAL_CAPSULE | Freq: Every day | ORAL | Status: DC
  Administered 2023-02-10: 130 mg via ORAL
  Filled 2023-02-09: qty 1

## 2023-02-09 MED ORDER — ACETAMINOPHEN 650 MG RE SUPP
650.0000 mg | Freq: Four times a day (QID) | RECTAL | Status: DC | PRN
  Filled 2023-02-09: qty 1

## 2023-02-09 MED ORDER — DILANTIN 100 MG PO CAPS: 100.0000 mg | ORAL_CAPSULE | Freq: Two times a day (BID) | ORAL | 3 refills | Status: DC

## 2023-02-09 NOTE — Telephone Encounter (Signed)
Pharmacy updated to [facility]

## 2023-02-09 NOTE — ED Notes (Signed)
Pt had an episode of contracting and gazing to the right side for about a minute. Resp even and unlabored during episode. EDP made aware

## 2023-02-09 NOTE — ED Notes (Signed)
ED TO INPATIENT HANDOFF REPORT  ED Nurse Name and Phone #: Alycia Rossetti [phone]  S Name/Age/Gender [name] 67 y.o. female Room/Bed: 033C/033C  Code Status   Code Status: Full Code  Home/SNF/Other Home Patient oriented to: self, place, time, and situation Is this baseline? Yes   Triage Complete: Triage complete  Chief Complaint Seizures (HCC) [R56.9]  Triage Note Pt bib [facility] from home where she lives with her daughter. Pt had a witnessed episode with daughter of shaking and going unresponsive. On EMS arrival, pt was unresponsive. NPA placed and NRB put on. Pt had one witnessed episode of shaking and gazing to the right side. EMS gave 5mg  versed IM en route. On arrival to ED pt is AOx3 and able to move all extremities.   Allergies Allergies  Allergen Reactions   Zonegran [Zonisamide] Other (See Comments)    Numbness and tingling over whole body   Chocolate Other (See Comments)    Triggers seizures   Codeine Nausea And Vomiting   Keppra [Levetiracetam] Other (See Comments)    Causes seizures   Lyrica [Pregabalin] Nausea And Vomiting   Olive Oil Rash    Level of Care/Admitting Diagnosis ED Disposition     ED Disposition  Admit   Condition  --   Comment  Hospital Area: [facility] [[id number]]  Level of Care: Telemetry Medical [104]  May place patient in observation at [facility] or [facility] if equivalent level of care is available:: No  Covid Evaluation: Asymptomatic - no recent exposure (last 10 days) testing not required  Diagnosis: Seizures [facility]) [[id number]]  Admitting Physician: Synetta Fail [[id number]]  Attending Physician: Synetta Fail [[id number]]          B Medical/Surgery History Past Medical History:  Diagnosis Date   Acute bronchitis 08/06/2022   Acute respiratory failure with hypoxia (HCC) 03/31/2015   Altered mental status    Asthma    Ataxia 08/26/2019   Atypical chest pain 02/17/2017   Cerebral hemorrhage (HCC)  1989   Chronic back pain    Closed fracture of distal end of left radius 07/17/2022   Closed fracture of fifth metatarsal bone 01/04/2019   Closed fracture of proximal phalanx of great toe 01/04/2019   Closed nondisplaced fracture of styloid process of left radius 08/06/2022   COPD (chronic obstructive pulmonary disease) (HCC)    no meds per daughter   COPD with exacerbation (HCC) 03/30/2015   Costochondritis 02/17/2017   COVID-19 virus infection 04/02/2020   DDD (degenerative disc disease) 06/12/2013   DVT (deep venous thrombosis) (HCC)    "she's had several since 1990"   GERD (gastroesophageal reflux disease)    no meds per daughter   Headache    "usually around time when she's had a seizure"   History of blood transfusion    "when she was a baby"   History of kidney stones    History of stomach ulcers    Hypoxia 01/18/2015   Lactic acidosis 04/02/2020   Neuropathy    Seizures (HCC)    "because of her brain surgery"  last one 07/28/22   Sepsis secondary to UTI (HCC) 01/17/2015   Stroke [facility]) 1990's   family denies residual on 11/09/2014   Tunnel vision    "since brain OR"   Past Surgical History:  Procedure Laterality Date   BRAIN SURGERY  1989   craniotomy with hematoma evacuation   BREAST BIOPSY Left 12/22/2022   [place] LT BREAST BX W LOC  DEV 1ST LESION IMG BX SPEC US GUIDE 12/22/2022 [facility]   BREAST BIOPSY  01/09/2023   MM LT RADIOACTIVE SEED LOC MAMMO GUIDE 01/09/2023 [facility]   BREAST LUMPECTOMY WITH RADIOACTIVE SEED AND SENTINEL LYMPH NODE BIOPSY Left 01/12/2023   Procedure: LEFT BREAST LUMPECTOMY WITH RADIOACTIVE SEED AND SENTINEL LYMPH NODE BIOPSY;  Surgeon: Manus Rudd, MD;  Location: [facility];  Service: General;  Laterality: Left;   CESAREAN SECTION  1979; 1987; 1989   HEMORRHOID SURGERY     IVC FILTER INSERTION N/A 10/12/2020   Procedure: IVC FILTER INSERTION;  Surgeon: Chuck Hint, MD;  Location: [facility];   Service: Cardiovascular;  Laterality: N/A;   OPEN REDUCTION INTERNAL FIXATION (ORIF) DISTAL RADIAL FRACTURE Left 08/25/2022   Procedure: OPEN REDUCTION INTERNAL FIXATION (ORIF) DISTAL RADIUS FRACTURE of nascent malunion;  Surgeon: Bradly Bienenstock, MD;  Location: [facility] OR;  Service: Orthopedics;  Laterality: Left;  regional with iv sedation   TUBAL LIGATION  1990's     A IV Location/Drains/Wounds Patient Lines/Drains/Airways Status     Active Line/Drains/Airways     Name Placement date Placement time Site Days   Peripheral IV 02/09/23 22 G Posterior;Right Hand 02/09/23  1133  Hand  less than 1   Peripheral IV 02/09/23 20 G Left;Posterior Hand 02/09/23  1133  Hand  less than 1            Intake/Output Last 24 hours No intake or output data in the 24 hours ending 02/09/23 1924  Labs/Imaging Results for orders placed or performed during the hospital encounter of 02/09/23 (from the past 48 hour(s))  Phenytoin level, total     Status: Abnormal   Collection Time: 02/09/23 11:28 AM  Result Value Ref Range   Phenytoin Lvl 5.6 (L) 10.0 - 20.0 ug/mL    Comment: Performed at [facility], [address], [place], [place] [address]  Comprehensive metabolic panel     Status: Abnormal   Collection Time: 02/09/23 11:28 AM  Result Value Ref Range   Sodium 141 135 - 145 mmol/L   Potassium 3.4 (L) 3.5 - 5.1 mmol/L   Chloride 104 98 - 111 mmol/L   CO2 22 22 - 32 mmol/L   Glucose, Bld 110 (H) 70 - 99 mg/dL    Comment: Glucose reference range applies only to samples taken after fasting for at least 8 hours.   BUN 15 8 - 23 mg/dL   Creatinine, Ser 6.04 (H) 0.44 - 1.00 mg/dL   Calcium 9.0 8.9 - 54.0 mg/dL   Total Protein 6.4 (L) 6.5 - 8.1 g/dL   Albumin 3.8 3.5 - 5.0 g/dL   AST 23 15 - 41 U/L   ALT 15 0 - 44 U/L   Alkaline Phosphatase 51 38 - 126 U/L   Total Bilirubin 0.7 0.3 - 1.2 mg/dL   GFR, Estimated 57 (L) >60 mL/min    Comment: (NOTE) Calculated using the CKD-EPI Creatinine Equation  (2021)    Anion gap 15 5 - 15    Comment: Performed at [facility], [address], [place], [place] [address]  CBC     Status: Abnormal   Collection Time: 02/09/23 11:28 AM  Result Value Ref Range   WBC 5.4 4.0 - 10.5 K/uL   RBC 3.77 (L) 3.87 - 5.11 MIL/uL   Hemoglobin 12.9 12.0 - 15.0 g/dL   HCT 14.7 82.9 - 56.2 %   MCV 103.4 (H) 80.0 - 100.0  fL   MCH 34.2 (H) 26.0 - 34.0 pg   MCHC 33.1 30.0 - 36.0 g/dL   RDW 16.1 09.6 - 04.5 %   Platelets 118 (L) 150 - 400 K/uL    Comment: CONSISTENT WITH PREVIOUS RESULT REPEATED TO VERIFY    nRBC 0.0 0.0 - 0.2 %    Comment: Performed at [facility], [address], [place], [place] [address]   DG Chest Portable 1 View  Result Date: 02/09/2023 CLINICAL DATA:  Cough. EXAM: PORTABLE CHEST 1 VIEW COMPARISON:  October 07, 2022. FINDINGS: The heart size and mediastinal contours are within normal limits. Both lungs are clear. The visualized skeletal structures are unremarkable. IMPRESSION: No active disease. Electronically Signed   By: Lupita Raider M.D.   On: 02/09/2023 13:37   CT HEAD WO CONTRAST ( )  Result Date: 02/09/2023 CLINICAL DATA:  Provided history: Seizure disorder, clinical change. EXAM: CT HEAD WITHOUT CONTRAST TECHNIQUE: Contiguous axial images were obtained from the base of the skull through the vertex without intravenous contrast. RADIATION DOSE REDUCTION: This exam was performed according to the departmental dose-optimization program which includes automated exposure control, adjustment of the mA and/or kV according to patient size and/or use of iterative reconstruction technique. COMPARISON:  Prior head CT examinations 11/04/2022 and earlier. FINDINGS: Brain: Redemonstrated large focus of chronic encephalomalacia/gliosis within the posterior right cerebral hemisphere (underlying a cranioplasty). There is no acute intracranial hemorrhage. No acute demarcated cortical infarct. No extra-axial fluid collection. No evidence of an  intracranial mass. No midline shift. Vascular: No hyperdense vessel.  Atherosclerotic calcifications. Skull: Right parietooccipital cranioplasty. No acute calvarial fracture or aggressive osseous lesion. Sinuses/Orbits: No orbital mass or acute orbital finding. Small to moderate-sized fluid level, and mild background mucosal thickening, within the right maxillary sinus. Other: Small-volume fluid within the left mastoid air cells. IMPRESSION: 1.  No evidence of an acute intracranial abnormality. 2. 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 Correlate with the surgical history. 3. Right maxillary sinusitis. 4. Small-volume fluid within the left mastoid air cells. Electronically Signed   By: Jackey Loge D.O.   On: 02/09/2023 12:55    Pending Labs Unresulted Labs (From admission, onward)     Start     Ordered   02/16/23 0500  Creatinine, serum  (enoxaparin (LOVENOX)    CrCl >/= 30 ml/min)  Weekly,   R     Comments: while on enoxaparin therapy    02/09/23 1433   02/10/23 0500  Comprehensive metabolic panel  Tomorrow morning,   R        02/09/23 1433   02/10/23 0500  CBC  Tomorrow morning,   R        02/09/23 1433   02/09/23 1130  Urinalysis, w/ Reflex to Culture (Infection Suspected) -Urine, Unspecified Source  Once,   URGENT       Question:  Specimen Source  Answer:  Urine, Unspecified Source   02/09/23 1129            Vitals/Pain Today's Vitals   02/09/23 1645 02/09/23 1730 02/09/23 1800 02/09/23 1900  BP: 120/61 126/66 120/61 134/64  Pulse: 94 90 90 92  Resp: 18 20 18 18   Temp:      TempSrc:      SpO2: 100% 100% 98% 100%    Isolation Precautions No active isolations  Medications Medications  oxybutynin (DITROPAN-XL) 24 hr tablet 5 mg (has no administration in time range)  lacosamide (VIMPAT) tablet 200 mg (has no administration in time range)  enoxaparin (LOVENOX) injection 40 mg  (40 mg Subcutaneous Given 02/09/23 1808)  LORazepam (ATIVAN) injection 2 mg (has no administration in time range)  sodium chloride flush (NS) 0.9 % injection 3 mL (3 mLs Intravenous Not Given 02/09/23 1523)  acetaminophen (TYLENOL) tablet 650 mg (has no administration in time range)    Or  acetaminophen (TYLENOL) suppository 650 mg (has no administration in time range)  polyethylene glycol (MIRALAX / GLYCOLAX) packet 17 g (has no administration in time range)  phenytoin (DILANTIN) ER capsule 100 mg (has no administration in time range)  phenytoin (DILANTIN) ER capsule 130 mg (has no administration in time range)  lacosamide (VIMPAT) 200 mg in sodium chloride 0.9 % 25 mL IVPB (0 mg Intravenous Stopped 02/09/23 1442)  LORazepam (ATIVAN) 2 MG/ML injection (1 mg  Given 02/09/23 1332)    Mobility walks     Focused Assessments    R Recommendations: See Admitting Provider Note  Report given to:   Additional Notes: Pt on 24 hr EEG, will be brought up separately

## 2023-02-09 NOTE — Procedures (Signed)
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 She presents with breakthrough seizures in the setting of medication noncompliance due to changes in insurance causing delay in receiving her Vimpat.  EEG to evaluate for seizure  Level of alertness: Awake  AEDs during EEG study: LCM, PHT, Ativan  Technical aspects: This EEG study was done with scalp electrodes positioned according to the 10-20 International system of electrode placement. Electrical activity was reviewed with band pass filter of 1-70Hz , sensitivity of 7 uV/mm, display speed of 44mm/sec with a 60Hz  notched filter applied as appropriate. EEG data were recorded continuously and digitally stored.  Video monitoring was available and reviewed as appropriate.  Description: The posterior dominant rhythm consists of 8-9 Hz activity of moderate voltage (25-35 uV) seen predominantly in posterior head regions, symmetric and reactive to eye opening and eye closing. EEG showed continuous polymorphic sharply contoured rhythmic 3 to 6 Hz theta-delta slowing in right hemisphere, maximal right posterior quadrant with waxing and waning morphology. Abundant spike and waves were noted in right hemisphere, maximal right posterior quadrant, at times qasi- periodic at 1-2Hz . Frequently, the right hemispheric spikes also involved the left  frontal region. Hyperventilation and photic stimulation were not performed.     ABNORMALITY - Spike and waves, right hemisphere, maximal right posterior quadrant - Continuous rhythmic slow, right hemisphere, maximal right posterior quadrant  IMPRESSION: This study showed evidence of epileptogenicity and cortical dysfunction arising from right  hemisphere, maximal right posterior quadrant. This EEG pattern is on the ictal-interictal continuum with potential for seizures. No definite seizures were seen throughout the recording.  Dr. Otelia Limes was notified.  Jerzi Tigert [name]

## 2023-02-09 NOTE — Progress Notes (Addendum)
LTM EEG hooked up and running - no initial skin breakdown - push button tested - [facility] NOT monitoring, pt is in ED GMD/SS

## 2023-02-09 NOTE — ED Provider Notes (Signed)
[place] [facility] AT [facility] Provider Note   CSN: [id number] Arrival date & time: 02/09/23  1106     History  Chief Complaint  Patient presents with   Seizures    DAIZY CHARD is a 67 y.o. female.   Seizures Patient resents after seizures.  History of same.  Initially cannot provide much history.  Had initially reported 1 seizure at home 1 in the ambulance and 1 here.  Later patient's daughter arrived and states she had 3 seizures at home.  However has been off of her Vimpat.  Is on Vimpat and Dilantin for seizures.  Has been out of the Vimpat for few days.  I have reviewed Dr. Rosalyn Gess neurology note from her outpatient visit.  Had another seizure shortly after arrival here.  Had recently had 5 IM Versed and still somewhat sedate.     Home Medications Prior to Admission medications   Medication Sig Start Date End Date Taking? Authorizing Provider  acetaminophen (TYLENOL) 325 MG tablet Take 650 mg by mouth daily as needed for mild pain or moderate pain.   Yes [provider]  Calcium Carbonate Antacid (TUMS PO) Take 2 tablets by mouth as needed (heartburn).   Yes [provider]  Cyanocobalamin (VITAMIN B-12 PO) Take 1 capsule by mouth daily.   Yes [provider]  DILANTIN 100 MG ER capsule Take 1 capsule (100 mg total) by mouth 2 (two) times daily. Patient taking differently: Take 100 mg by mouth at bedtime. 12/10/22  Yes Van Clines, MD  DILANTIN 30 MG ER capsule TAKE 1 CAPSULE BY MOUTH EVERY  NIGHT ALONG WITH 100 MG CAPSULE  FOR A TOTAL EVERY NIGHT DOSE OF  130 MG Patient taking differently: Take 30 mg by mouth in the morning. 12/10/22  Yes Van Clines, MD  folic acid (FOLVITE) 1 MG tablet Take 1 mg by mouth daily.   Yes [provider]  LORazepam (ATIVAN) 1 MG tablet TAKE 1 TABLET BY MOUTH AS DIRECTED AS NEEDED FOR 3 OR MORE SEIZURES IN 24 HOURS. DO NOT TAKE MORE THAN 2 TABLETS IN 24 HOURS. Patient taking  differently: Take 1 mg by mouth daily as needed for seizure. 10/10/22  Yes Van Clines, MD  oxybutynin (DITROPAN-XL) 5 MG 24 hr tablet Take 1 tablet (5 mg total) by mouth at bedtime. Patient taking differently: Take 5 mg by mouth in the morning. 08/06/22  Yes Rodolph Bong, MD  VIMPAT 200 MG TABS tablet Take 1 tablet (200 mg total) by mouth 2 (two) times daily. 12/10/22  Yes Van Clines, MD  Cenobamate (XCOPRI) 100 MG TABS Take by mouth. Patient not taking: Reported on 02/09/2023    [provider]  gabapentin (NEURONTIN) 300 MG capsule Take 300 mg by mouth 3 (three) times daily. Patient not taking: Reported on 02/09/2023    [provider]  pregabalin (LYRICA) 50 MG capsule Take 1 capsule every night Patient not taking: Reported on 02/09/2023 12/10/22   Van Clines, MD  traMADol (ULTRAM) 50 MG tablet Take 1 tablet (50 mg total) by mouth every 6 (six) hours as needed for moderate pain or severe pain. Patient not taking: Reported on 02/09/2023 01/12/23   Manus Rudd, MD      Allergies    Zonegran [zonisamide], Chocolate, Codeine, Keppra [levetiracetam], Lyrica [pregabalin], and Olive oil    Review of Systems   Review of Systems  Neurological:  Positive for seizures.  Physical Exam Updated Vital Signs BP (!) 120/54   Pulse 87   Temp 98.8 F (37.1 C) (Axillary)   Resp 20   SpO2 100%  Physical Exam Vitals and nursing note reviewed.  Cardiovascular:     Rate and Rhythm: Regular rhythm.  Pulmonary:     Breath sounds: No wheezing or rhonchi.     Comments: My harsh breath sounds Abdominal:     Tenderness: There is no abdominal tenderness.  Musculoskeletal:        General: No tenderness.  Neurological:     Comments: Initial sedate.  Mental status later improved.  Able to recognize her daughter and provide history.     ED Results / Procedures / Treatments   Labs (all labs ordered are listed, but only abnormal results are displayed) Labs Reviewed   PHENYTOIN LEVEL, TOTAL - Abnormal; Notable for the following components:      Result Value   Phenytoin Lvl 5.6 (*)    All other components within normal limits  COMPREHENSIVE METABOLIC PANEL - Abnormal; Notable for the following components:   Potassium 3.4 (*)    Glucose, Bld 110 (*)    Creatinine, Ser 1.08 (*)    Total Protein 6.4 (*)    GFR, Estimated 57 (*)    All other components within normal limits  CBC - Abnormal; Notable for the following components:   RBC 3.77 (*)    MCV 103.4 (*)    MCH 34.2 (*)    Platelets 118 (*)    All other components within normal limits  URINALYSIS, W/ REFLEX TO CULTURE (INFECTION SUSPECTED)    EKG None  Radiology DG Chest Portable 1 View  Result Date: 02/09/2023 CLINICAL DATA:  Cough. EXAM: PORTABLE CHEST 1 VIEW COMPARISON:  October 07, 2022. FINDINGS: The heart size and mediastinal contours are within normal limits. Both lungs are clear. The visualized skeletal structures are unremarkable. IMPRESSION: No active disease. Electronically Signed   By: Lupita Raider M.D.   On: 02/09/2023 13:37   CT HEAD WO CONTRAST ( )  Result Date: 02/09/2023 CLINICAL DATA:  Provided history: Seizure disorder, clinical change. EXAM: CT HEAD WITHOUT CONTRAST TECHNIQUE: Contiguous axial images were obtained from the base of the skull through the vertex without intravenous contrast. RADIATION DOSE REDUCTION: This exam was performed according to the departmental dose-optimization program which includes automated exposure control, adjustment of the mA and/or kV according to patient size and/or use of iterative reconstruction technique. COMPARISON:  Prior head CT examinations 11/04/2022 and earlier. FINDINGS: Brain: Redemonstrated large focus of chronic encephalomalacia/gliosis within the posterior right cerebral hemisphere (underlying a cranioplasty). There is no acute intracranial hemorrhage. No acute demarcated cortical infarct. No extra-axial fluid collection. No evidence  of an intracranial mass. No midline shift. Vascular: No hyperdense vessel.  Atherosclerotic calcifications. Skull: Right parietooccipital cranioplasty. No acute calvarial fracture or aggressive osseous lesion. Sinuses/Orbits: No orbital mass or acute orbital finding. Small to moderate-sized fluid level, and mild background mucosal thickening, within the right maxillary sinus. Other: Small-volume fluid within the left mastoid air cells. IMPRESSION: 1.  No evidence of an acute intracranial abnormality. 2. 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 Correlate with the surgical history. 3. Right maxillary sinusitis. 4. Small-volume fluid within the left mastoid air cells. Electronically Signed   By: Jackey Loge D.O.   On: 02/09/2023 12:55    Procedures Procedures    Medications Ordered in ED Medications  oxybutynin (DITROPAN-XL) 24  hr tablet 5 mg (has no administration in time range)  lacosamide (VIMPAT) tablet 200 mg (has no administration in time range)  phenytoin (DILANTIN) ER capsule 100 mg (has no administration in time range)  phenytoin (DILANTIN) ER capsule 30 mg (has no administration in time range)  enoxaparin (LOVENOX) injection 40 mg (has no administration in time range)  LORazepam (ATIVAN) injection 2 mg (has no administration in time range)  sodium chloride flush (NS) 0.9 % injection 3 mL (3 mLs Intravenous Not Given 02/09/23 1523)  acetaminophen (TYLENOL) tablet 650 mg (has no administration in time range)    Or  acetaminophen (TYLENOL) suppository 650 mg (has no administration in time range)  polyethylene glycol (MIRALAX / GLYCOLAX) packet 17 g (has no administration in time range)  lacosamide (VIMPAT) 200 mg in sodium chloride 0.9 % 25 mL IVPB (0 mg Intravenous Stopped 02/09/23 1442)  LORazepam (ATIVAN) 2 MG/ML injection (1 mg  Given 02/09/23 1332)    ED Course/ Medical Decision Making/ A&P                              Medical Decision Making Amount and/or Complexity of Data Reviewed Labs: ordered. Radiology: ordered.  Risk Decision regarding hospitalization.   Patient with recurrent seizures.  Has had another episode here.  Had been loaded with Versed by EMS.  Discussed with Dr. Otelia Limes from neurology.  Will load back up on Vimpat now that we have found out she has been noncompliant due to insurance issues.  Now is had her second seizure in the ER.  Per daughter this is now 6 total seizures.  Although she has not been loaded on the Vimpat yet since it is coming up from pharmacy I think she would benefit from mission the hospital for further monitoring.  Head CT reassuring.  Also coming out of seizure now but giving Ativan now to temporize for Vimpat.   CRITICAL CARE Performed by: Benjiman Core Total critical care time: 30 minutes Critical care time was exclusive of separately billable procedures and treating other patients. Critical care was necessary to treat or prevent imminent or life-threatening deterioration. Critical care was time spent personally by me on the following activities: development of treatment plan with patient and/or surrogate as well as nursing, discussions with consultants, evaluation of patient's response to treatment, examination of patient, obtaining history from patient or surrogate, ordering and performing treatments and interventions, ordering and review of laboratory studies, ordering and review of radiographic studies, pulse oximetry and re-evaluation of patient's condition.  Dilantin level now is actually low.  However reviewing notes from neurology would not supplement due to issues with toxicity for her.          Final Clinical Impression(s) / ED Diagnoses Final diagnoses:  Seizures (HCC)  Noncompliance with medications    Rx / DC Orders ED Discharge Orders     None         Benjiman Core, MD 02/09/23 1531

## 2023-02-09 NOTE — ED Triage Notes (Signed)
Pt bib [facility] from home where she lives with her daughter. Pt had a witnessed episode with daughter of shaking and going unresponsive. On EMS arrival, pt was unresponsive. NPA placed and NRB put on. Pt had one witnessed episode of shaking and gazing to the right side. EMS gave 5mg  versed IM en route. On arrival to ED pt is AOx3 and able to move all extremities.

## 2023-02-09 NOTE — H&P (Signed)
History and Physical   [name]:[id number] DOB: 12-16-1955 DOA: 02/09/2023  PCP: [name], FNP   Patient coming from: Home  Chief Complaint: Seizures  HPI: [name] is a 67 y.o. female with medical history significant of thrombocytopenia, paroxysmal A-fib, hypertension, hyperlipidemia, GERD, DVT, chronic pain, COPD, anxiety, hemorrhagic stroke, stress incontinence, seizure disorder, neuropathy, breast cancer presenting after multiple seizures at home.  History obtained with assistance of family and chart review due to patient's confusion  surrounding the events and initial confusion in the ED.  Patient had 3 seizures at home.  EMS was called and she had an additional seizure and route for which she received 5 mg IM Versed.  While awaiting Vimpat load in the ED she had 2 additional seizures.  She has history of somewhat difficult to control seizures.  Currently prescribed Dilantin and Vimpat.  She has not had Vimpat for the last several days due to issues with her insurance.  She denies fevers, chills, chest pain or shortness breath, abdominal pain, constipation, diarrhea, nausea, vomiting.  Surgeries are  ED Course: Vital signs in the ED notable for heart rate in the 70s to 110s, blood pressure in the 100s to 120s systolic.  Lab workup included CMP with potassium 3.4, creatinine stable 1.08, glucose 110, protein 6.4.  CBC with platelets stable at 118.  Phenytoin level low at 5.6.  Urinalysis pending.  Chest x-ray showed no acute normality.  CT head and acute abnormality but showed chronic encephalomalacia/gliosis status post cranioplasty.  Review of Systems: As per HPI otherwise all other systems reviewed and are negative.  Past Medical History:  Diagnosis Date   Acute bronchitis 08/06/2022   Acute respiratory failure with hypoxia (HCC) 03/31/2015   Altered mental status    Asthma    Ataxia 08/26/2019   Atypical chest pain 02/17/2017   Cerebral hemorrhage (HCC) 1989    Chronic back pain    Closed fracture of distal end of left radius 07/17/2022   Closed fracture of fifth metatarsal bone 01/04/2019   Closed fracture of proximal phalanx of great toe 01/04/2019   Closed nondisplaced fracture of styloid process of left radius 08/06/2022   COPD (chronic obstructive pulmonary disease) (HCC)    no meds per daughter   COPD with exacerbation (HCC) 03/30/2015   Costochondritis 02/17/2017   COVID-19 virus infection 04/02/2020   DDD (degenerative disc disease) 06/12/2013   DVT (deep venous thrombosis) (HCC)    "she's had several since 1990"   GERD (gastroesophageal reflux disease)    no meds per daughter   Headache    "usually around time when she's had a seizure"   History of blood transfusion    "when she was a baby"   History of kidney stones    History of stomach ulcers    Hypoxia 01/18/2015   Lactic acidosis 04/02/2020   Neuropathy    Seizures (HCC)    "because of her brain surgery"  last one 07/28/22   Sepsis secondary to UTI (HCC) 01/17/2015   Stroke [facility]) 1990's   family denies residual on 11/09/2014   Tunnel vision    "since brain OR"    Past Surgical History:  Procedure Laterality Date   BRAIN SURGERY  1989   craniotomy with hematoma evacuation   BREAST BIOPSY Left 12/22/2022   [place] LT BREAST BX W LOC DEV 1ST LESION IMG BX SPEC US GUIDE 12/22/2022 [facility]   BREAST BIOPSY  01/09/2023   MM LT RADIOACTIVE  SEED LOC MAMMO GUIDE 01/09/2023 [facility]   BREAST LUMPECTOMY WITH RADIOACTIVE SEED AND SENTINEL LYMPH NODE BIOPSY Left 01/12/2023   Procedure: LEFT BREAST LUMPECTOMY WITH RADIOACTIVE SEED AND SENTINEL LYMPH NODE BIOPSY;  Surgeon: Manus Rudd, MD;  Location: [facility];  Service: General;  Laterality: Left;   CESAREAN SECTION  1979; 1987; 1989   HEMORRHOID SURGERY     IVC FILTER INSERTION N/A 10/12/2020   Procedure: IVC FILTER INSERTION;  Surgeon: Chuck Hint, MD;  Location: [facility];   Service: Cardiovascular;  Laterality: N/A;   OPEN REDUCTION INTERNAL FIXATION (ORIF) DISTAL RADIAL FRACTURE Left 08/25/2022   Procedure: OPEN REDUCTION INTERNAL FIXATION (ORIF) DISTAL RADIUS FRACTURE of nascent malunion;  Surgeon: Bradly Bienenstock, MD;  Location: [facility] OR;  Service: Orthopedics;  Laterality: Left;  regional with iv sedation   TUBAL LIGATION  1990's    Social History  reports that she quit smoking about 5 years ago. Her smoking use included cigarettes. She has a 10.00 pack-year smoking history. She has never used smokeless tobacco. She reports that she does not drink alcohol and does not use drugs.  Allergies  Allergen Reactions   Zonegran [Zonisamide] Other (See Comments)    Numbness and tingling over whole body   Chocolate Other (See Comments)    Triggers seizures   Codeine Nausea And Vomiting   Keppra [Levetiracetam] Other (See Comments)    Causes seizures   Lyrica [Pregabalin] Nausea And Vomiting   Olive Oil Rash    Family History  Problem Relation Age of Onset   Colon cancer Mother        started as vaginal cancer   Heart attack Mother        4 MIs, started in her 8s, also vaginal cancer and colon cancer   Cancer Mother    Heart attack Father        Died suddenly age 5 - autopsy revealed heart attack her patient   Cancer Brother        Sinus cancer   Cervical cancer Maternal Aunt    Rectal cancer Neg Hx    Stomach cancer Neg Hx    Esophageal cancer Neg Hx   Reviewed on admission  Prior to Admission medications   Medication Sig Start Date End Date Taking? Authorizing Provider  acetaminophen (TYLENOL) 325 MG tablet Take 650 mg by mouth daily as needed for mild pain or moderate pain.    [provider]  DILANTIN 100 MG ER capsule Take 1 capsule (100 mg total) by mouth 2 (two) times daily. 12/10/22   Van Clines, MD  DILANTIN 30 MG ER capsule TAKE 1 CAPSULE BY MOUTH EVERY  NIGHT ALONG WITH 100 MG CAPSULE  FOR A TOTAL EVERY NIGHT DOSE OF  130 MG 12/10/22    Van Clines, MD  folic acid (FOLVITE) 1 MG tablet Take 1 mg by mouth daily.    [provider]  LORazepam (ATIVAN) 1 MG tablet TAKE 1 TABLET BY MOUTH AS DIRECTED AS NEEDED FOR 3 OR MORE SEIZURES IN 24 HOURS. DO NOT TAKE MORE THAN 2 TABLETS IN 24 HOURS. 10/10/22   Van Clines, MD  oxybutynin (DITROPAN-XL) 5 MG 24 hr tablet Take 1 tablet (5 mg total) by mouth at bedtime. 08/06/22   Rodolph Bong, MD  pregabalin (LYRICA) 50 MG capsule Take 1 capsule every night 12/10/22   Van Clines, MD  traMADol (ULTRAM) 50 MG tablet Take 1 tablet (50 mg  total) by mouth every 6 (six) hours as needed for moderate pain or severe pain. 01/12/23   Manus Rudd, MD  VIMPAT 200 MG TABS tablet Take 1 tablet (200 mg total) by mouth 2 (two) times daily. 12/10/22   Van Clines, MD    Physical Exam: Vitals:   02/09/23 1115 02/09/23 1132 02/09/23 1135  BP: (!) 126/51    Pulse: (!) 113    Resp:   16  Temp:  98.8 F (37.1 C)   TempSrc:  Axillary   SpO2: 93%      Physical Exam Constitutional:      General: She is not in acute distress.    Appearance: Normal appearance.  HENT:     Head: Normocephalic and atraumatic.     Mouth/Throat:     Mouth: Mucous membranes are moist.     Pharynx: Oropharynx is clear.  Eyes:     Extraocular Movements: Extraocular movements intact.     Pupils: Pupils are equal, round, and reactive to light.  Cardiovascular:     Rate and Rhythm: Normal rate and regular rhythm.     Pulses: Normal pulses.     Heart sounds: Normal heart sounds.  Pulmonary:     Effort: Pulmonary effort is normal. No respiratory distress.     Breath sounds: Normal breath sounds.  Abdominal:     General: Bowel sounds are normal. There is no distension.     Palpations: Abdomen is soft.     Tenderness: There is no abdominal tenderness.  Musculoskeletal:        General: No swelling or deformity.  Skin:    General: Skin is warm and dry.  Neurological:     General: No focal deficit  present.     Comments: Tired appearing, returning to baseline    Labs on Admission: I have personally reviewed following labs and imaging studies  CBC: Recent Labs  Lab 02/09/23 1128  WBC 5.4  HGB 12.9  HCT 39.0  MCV 103.4*  PLT 118*    Basic Metabolic Panel: Recent Labs  Lab 02/09/23 1128  NA 141  K 3.4*  CL 104  CO2 22  GLUCOSE 110*  BUN 15  CREATININE 1.08*  CALCIUM 9.0    GFR: CrCl cannot be calculated (Unknown ideal weight.).  Liver Function Tests: Recent Labs  Lab 02/09/23 1128  AST 23  ALT 15  ALKPHOS 51  BILITOT 0.7  PROT 6.4*  ALBUMIN 3.8    Urine analysis:    Component Value Date/Time   COLORURINE AMBER (A) 11/04/2022 2301   APPEARANCEUR CLOUDY (A) 11/04/2022 2301   LABSPEC 1.026 11/04/2022 2301   PHURINE 5.0 11/04/2022 2301   GLUCOSEU NEGATIVE 11/04/2022 2301   HGBUR MODERATE (A) 11/04/2022 2301   BILIRUBINUR NEGATIVE 11/04/2022 2301   KETONESUR NEGATIVE 11/04/2022 2301   PROTEINUR 100 (A) 11/04/2022 2301   UROBILINOGEN 0.2 01/17/2015 2350   NITRITE NEGATIVE 11/04/2022 2301   LEUKOCYTESUR LARGE (A) 11/04/2022 2301    Radiological Exams on Admission: DG Chest Portable 1 View  Result Date: 02/09/2023 CLINICAL DATA:  Cough. EXAM: PORTABLE CHEST 1 VIEW COMPARISON:  October 07, 2022. FINDINGS: The heart size and mediastinal contours are within normal limits. Both lungs are clear. The visualized skeletal structures are unremarkable. IMPRESSION: No active disease. Electronically Signed   By: Lupita Raider M.D.   On: 02/09/2023 13:37   CT HEAD WO CONTRAST ( )  Result Date: 02/09/2023 CLINICAL DATA:  Provided history: Seizure disorder, clinical change. EXAM:  CT HEAD WITHOUT CONTRAST TECHNIQUE: Contiguous axial images were obtained from the base of the skull through the vertex without intravenous contrast. RADIATION DOSE REDUCTION: This exam was performed according to the departmental dose-optimization program which includes automated exposure  control, adjustment of the mA and/or kV according to patient size and/or use of iterative reconstruction technique. COMPARISON:  Prior head CT examinations 11/04/2022 and earlier. FINDINGS: Brain: Redemonstrated large focus of chronic encephalomalacia/gliosis within the posterior right cerebral hemisphere (underlying a cranioplasty). There is no acute intracranial hemorrhage. No acute demarcated cortical infarct. No extra-axial fluid collection. No evidence of an intracranial mass. No midline shift. Vascular: No hyperdense vessel.  Atherosclerotic calcifications. Skull: Right parietooccipital cranioplasty. No acute calvarial fracture or aggressive osseous lesion. Sinuses/Orbits: No orbital mass or acute orbital finding. Small to moderate-sized fluid level, and mild background mucosal thickening, within the right maxillary sinus. Other: Small-volume fluid within the left mastoid air cells. IMPRESSION: 1.  No evidence of an acute intracranial abnormality. 2. 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 Correlate with the surgical history. 3. Right maxillary sinusitis. 4. Small-volume fluid within the left mastoid air cells. Electronically Signed   By: Jackey Loge D.O.   On: 02/09/2023 12:55    EKG: Independently reviewed.  Sinus tachycardia 101 bpm.  Nonspecific T wave flattening.  Assessment/Plan Active Problems:   Thrombocytopenia- chronic   COPD (chronic obstructive pulmonary disease) (HCC)   Anxiety   GERD (gastroesophageal reflux disease)   Neuropathy   History of hemorrhagic cerebrovascular accident (CVA) without residual deficits   PAF (paroxysmal atrial fibrillation) (HCC)   Stress incontinence   Hyperlipidemia, unspecified   Hypertension   Malignant neoplasm of upper-outer quadrant of left breast in female, estrogen receptor positive (HCC)   Seizures (HCC)   Seizure > Patient with known history  of seizures presenting after 3 seizures at home, 1 round and then 2 additional seizures in the ED. > Prescribe Dilantin and Vimpat but has been out of Vimpat for several days due to insurance issues. > Neurology consulted in the ED and are following. - Monitor on telemetry overnight - Appreciate neurology recommendations - Continue with Vimpat load - If tolerating p.o. continue home p.o. Vimpat and Dilantin - As needed Ativan for seizures - Seizure precautions - Neurochecks - Supportive care  Thrombocytopenia > Platelets stable 118 - Trend CBC  Stress incontinence - Continue home oxybutynin  Chronic pain - Continue home tramadol as needed  Neuropathy - Continue home Lyrica  COPD Hypertension Hyperlipidemia GERD Paroxysmal A-fib Anxiety - Not currently on medications for these, but history noted in chart   Breast cancer > Recently diagnosed breast cancer.  Has undergone lumpectomy.  Pending Oncotype to help guide further therapy.  Follows with oncology. - Noted  History of DVT History of hemorrhagic CVA - Noted  DVT prophylaxis: Lovenox Code Status:   Full Family Communication:  Updated at bedside  Disposition Plan:   Patient is from:  Home  Anticipated DC to:  Home  Anticipated DC date:  1 to 2 days  Anticipated DC barriers: None  Consults called:  Neurology Admission status:  Observation, telemetry  Severity of Illness: The appropriate patient status for this patient is OBSERVATION. Observation status is judged to be reasonable and necessary in order to provide the required intensity of service to ensure the patient's safety. The patient's presenting symptoms, physical exam findings, and initial radiographic and laboratory data in the context of their medical condition  is felt to place them at decreased risk for further clinical deterioration. Furthermore, it is anticipated that the patient will be medically stable for discharge from the hospital within 2  midnights of admission.    Synetta Fail MD [facility]  How to contact the [facility] Attending or Consulting provider 7A - 7P or covering provider during after hours 7P -7A, for this patient?   Check the care team in [facility] and look for a) attending/consulting [facility] provider listed and b) the [facility] team listed Log into [email or web address] and use [place] universal password to access. If you do not have the password, please contact the hospital operator. Locate the [facility] provider you are looking for under [facility] and page to a number that you can be directly reached. If you still have difficulty reaching the provider, please page the [facility] (Director on Call) for the Hospitalists listed on amion for assistance.  02/09/2023, 2:34 PM

## 2023-02-09 NOTE — ED Notes (Signed)
Per family, pt had seizure like activity at 1845 & 1856.

## 2023-02-09 NOTE — Consult Note (Addendum)
Neurology Consultation    Reason for Consult:breakthrough seizures   HISTORY OF PRESENT ILLNESS   HPI  History is obtained from:patient and daughter, who is at bedside and is main caregiver (lives with her) LAQUILLA ANGERMEIER is a 67 y.o. 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  Semiology of seizures is generalized tonic clonic but historically, she has had prodrome of headache, burning sensation of left arm, left arm jerking or no warning whatsoever.  Right posterior temporal epileptiform discharges found on EEG during EMU stay in 2014 but there were also left anterior temporal epileptiform discharges less frequently.  She has tried a number of medications for seizure prevention, including gabapentin, Xcopri, but has required Dilantin to stay seizure-free.  She usually takes 130 mg in the morning and 100 mg in the evening of Dilantin as well as Vimpat 200 mg twice daily.  Last seen in office by Dr. Karel Jarvis in May 2024. At this time, Vimpat, Dilantin, and Xcopri had been utilized for seizure prophylaxis but she had to discontinue Xcopri due to gait dysequilibrium.   Current regimen prior to this admission: dilantin 130-100mg  , vimpat 200-200mg . She changed insurances and was without vimpat x 2 days when she began having breakthrough GTC seizures. She had 1 on Saturday, 1 Sunday, then multiple this AM, prompting emergency room evaluation.   ROS + for low grade "fever" of 99 degrees F, which is higher than her typical baseline. No UTI symptoms, but has been prone to breakthrough seizures with these in the past. She has mild residual left sided weakness from prior AVM rupture.   Prior AEDs: phenobarbital (ineffective, mood issues), Depakote (multiple trials, ineffective), Keppra (ineffective), Zonisamide (side effects on brief trial of higher dose than prescribed - 500mg  instead of 200mg ), Onfi (mouth sores),  Tegretol (ineffective), Lamictal (nausea, dizziness, malaise), Felbatol (nausea, dizziness, malaise), Topamax (ineffective), Tigabine (tried per notes, details unclear), Neurontin (ineffective, nausea), Lyrica (leg pain reported on 25mg  daily, tingling), Briviact (drowsiness), Fycompa, Cenobamate  Other medical history includes  peripheral polyneuropathy affecting lower extremities thought to be from existing lumbar radiculopathy.  Current EMG and nerve conduction studies in 2019 showed sensory axonal abnormalities worse on the left and mild to moderate in severity. Multiple, unprovoked LE and UE DVTs for which she had been on heparin at some point, but this was discontinued due to contraindication in setting of prior AVM. 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  Residual left sided weakness and left visual field cut from AVM rupture   Thrombocytopenia  paroxysmal A-fib (not on AC in setting of prior AVM, has IVC filter followed by vascular surgery) Hypertension Hyperlipidemia GERD chronic pain  COPD Anxiety stress incontinence on oxybutynin   The patient denies headache, dizziness, visual changes, problems with swallowing or speech, focal muscle weakness, numbness or tingling of her extremities, abnormal movements, or other focal neurological deficits.  ROS: Full ROS was performed and is negative except as noted in the HPI.   PAST MEDICAL HISTORY    Past Medical History:  Past Medical History:  Diagnosis Date   Acute bronchitis 08/06/2022   Acute respiratory failure with hypoxia (HCC) 03/31/2015   Altered mental status    Asthma    Ataxia 08/26/2019   Atypical chest pain 02/17/2017  Cerebral hemorrhage (HCC) 1989   Chronic back pain    Closed fracture of distal end of left radius 07/17/2022    Closed fracture of fifth metatarsal bone 01/04/2019   Closed fracture of proximal phalanx of great toe 01/04/2019   Closed nondisplaced fracture of styloid process of left radius 08/06/2022   COPD (chronic obstructive pulmonary disease) (HCC)    no meds per daughter   COPD with exacerbation (HCC) 03/30/2015   Costochondritis 02/17/2017   COVID-19 virus infection 04/02/2020   DDD (degenerative disc disease) 06/12/2013   DVT (deep venous thrombosis) (HCC)    "she's had several since 1990"   GERD (gastroesophageal reflux disease)    no meds per daughter   Headache    "usually around time when she's had a seizure"   History of blood transfusion    "when she was a baby"   History of kidney stones    History of stomach ulcers    Hypoxia 01/18/2015   Lactic acidosis 04/02/2020   Neuropathy    Seizures (HCC)    "because of her brain surgery"  last one 07/28/22   Sepsis secondary to UTI (HCC) 01/17/2015   Stroke [facility]) 1990's   family denies residual on 11/09/2014   Tunnel vision    "since brain OR"    No family history on file. Family History  Problem Relation Age of Onset   Colon cancer Mother        started as vaginal cancer   Heart attack Mother        4 MIs, started in her 44s, also vaginal cancer and colon cancer   Cancer Mother    Heart attack Father        Died suddenly age 70 - autopsy revealed heart attack her patient   Cancer Brother        Sinus cancer   Cervical cancer Maternal Aunt    Rectal cancer Neg Hx    Stomach cancer Neg Hx    Esophageal cancer Neg Hx     Allergies:  Allergies  Allergen Reactions   Zonegran [Zonisamide] Other (See Comments)    Numbness and tingling over whole body   Chocolate Other (See Comments)    Triggers seizures   Codeine Nausea And Vomiting   Keppra [Levetiracetam] Other (See Comments)    Causes seizures   Lyrica [Pregabalin] Nausea And Vomiting   Olive Oil Rash    Social History:   reports that she quit smoking about 5  years ago. Her smoking use included cigarettes. She has a 10.00 pack-year smoking history. She has never used smokeless tobacco. She reports that she does not drink alcohol and does not use drugs.    Medications (Not in a hospital admission)   EXAMINATION    Current vital signs:    02/09/2023    3:00 PM 02/09/2023   11:15 AM 01/12/2023   12:52 PM  Vitals with BMI  Systolic 120 126 604  Diastolic 54 51 52  Pulse 87 113 84    Examination:  GENERAL: Awake, alert in NAD. Frail appearing. Underweight.  HEENT: - Normocephalic and atraumatic, dry mm, no lymphadenopathy, no Thyromegally LUNGS - Clear to auscultation bilaterally CV - S1S2 RRR, equal pulses bilaterally. ABDOMEN - Soft, nontender, nondistended with normoactive BS Ext: warm, well perfused, intact peripheral pulses, no pedal edema  NEURO:  Mental Status: AA&Ox2 (disoriented to time) .  Language: Speech is clear, appropriate in general but with 4/5 naming (called cell phone an alarm  clock). Intact repetition, fluency, and comprehension. Cranial Nerves:  II: PERRL. Visual fields full to confrontation on right, does not blink to threat on left (baseline LHH) III, IV, VI: EOM in tact. Eyelids elevate symmetrically.  V: Sensation intact V1-3 symmetrically  VII: no facial asymmetry   VIII: hearing intact to voice IX, X: Palate elevates symmetrically. Phonation is normal.  ZO:[id number] shrug 5/5 and symmetrical  XII: tongue is midline without fasciculations. Motor:  5-/5 throughout except 4/5 left knee flexion and hip flexion  Reflexes:deferred  Sensation: intact to pin prick and light touch BL  Tone: is normal and bulk is normal Coordination: FTN intact bilaterally, no ataxia in BLE., + head titubation, subtle - no other abnormal movements  Gait- deferred  LABS   I have reviewed labs in epic and the results pertinent to this consultation are:  Lab Results  Component Value Date   LDLCALC 112 (H) 10/10/2020   Lab Results   Component Value Date   ALT 15 02/09/2023   AST 23 02/09/2023   ALKPHOS 51 02/09/2023   BILITOT 0.7 02/09/2023   Lab Results  Component Value Date   HGBA1C 5.0 08/02/2020   Lab Results  Component Value Date   WBC 5.4 02/09/2023   HGB 12.9 02/09/2023   HCT 39.0 02/09/2023   MCV 103.4 (H) 02/09/2023   PLT 118 (L) 02/09/2023   Lab Results  Component Value Date   VITAMINB12 189 08/03/2022   Lab Results  Component Value Date   FOLATE 5.7 (L) 08/03/2022   Lab Results  Component Value Date   NA 141 02/09/2023   K 3.4 (L) 02/09/2023   CL 104 02/09/2023   CO2 22 02/09/2023    DIAGNOSTIC IMAGING/PROCEDURES   I have reviewed the images obtained:, as below    CT-head No acute changes, encephalomalacia right parietoccipital area   ASSESSMENT/PLAN    Assessment: 67 year old female with a 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 She presents with breakthrough seizures in the setting of medication noncompliance due to changes in insurance causing delay in receiving her Vimpat.   - While this lag in medication coverage is likely etiology of breakthrough seizures, she has felt subjective fever and has history of UTI-related breakthrough seizures. No meningismus or headache to suggest meningitic process.  - Overall impression: Breakthrough generalized tonic clonic seizure activity: medication noncompliance +/- occult infectious etiology . Less likely neoplastic disease, but entertained metastatic disease in setting of newly diagnosed stage I left ductal cell carcinoma. Examination unchanged and staging of this makes this less likely. CT head also without acute changes.No recent trauma.   Recommendations: -infectious workup per primary team, UA pending  -spot EEG to ascertain changes from prior  -administered ativan and loaded with 200mg  vimpat after second witnessed seizure in ED today  -continue home  medication regimen: vimpat 200mg  bid + dilantin 130mg  in AM and 100mg  in PM (see Dr. Rosalyn Gess note from May 2024, dilantin toxicity has been an issue so regardless of seizure recurrence, DO NOT recommend increasing dilantin even in presence of subtherapeutic levels)  -avoid tramadol, as this decreases seizure threshold  -will follow up EEG -of note, rectal versus nasal route benzo would be helpful, as patient's daughter only has PO lorazepam for breakthrough seizures -low vitamin B12 level in 2023. Would repeat level during this admission. If < 400, start B12 at 2 mg po every day.   Addendum: EEG with frequent sharp waves  both temporal lobes and patient still with mild paraphasic errors which is not her baseline. Will order long term EEG monitor. Due for 100mg  dilantin dose so will give now -- Sanjuana Letters, PA-C Neurology Department   I have seen and examined the patient. I have reviewed the assessment and recommendations and made amendations as needed. 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 She presents with breakthrough seizures in the setting of medication noncompliance due to changes in insurance causing delay in receiving her Vimpat. Mild naming deficit on exam. No meningismus or headache to suggest meningitic process. Recommendations as above.  Electronically signed: Dr. Caryl Pina

## 2023-02-09 NOTE — ED Notes (Signed)
NRB removed and pt place on [facility]

## 2023-02-09 NOTE — Progress Notes (Signed)
EEG complete - results pending.  GMD/SS 

## 2023-02-10 ENCOUNTER — Telehealth: Payer: Self-pay | Admitting: Radiation Oncology

## 2023-02-10 ENCOUNTER — Other Ambulatory Visit: Payer: Self-pay

## 2023-02-10 DIAGNOSIS — N393 Stress incontinence (female) (male): Secondary | ICD-10-CM | POA: Diagnosis not present

## 2023-02-10 DIAGNOSIS — E441 Mild protein-calorie malnutrition: Secondary | ICD-10-CM | POA: Diagnosis not present

## 2023-02-10 DIAGNOSIS — I82A11 Acute embolism and thrombosis of right axillary vein: Secondary | ICD-10-CM | POA: Diagnosis not present

## 2023-02-10 DIAGNOSIS — J439 Emphysema, unspecified: Secondary | ICD-10-CM | POA: Diagnosis not present

## 2023-02-10 DIAGNOSIS — J449 Chronic obstructive pulmonary disease, unspecified: Secondary | ICD-10-CM | POA: Diagnosis not present

## 2023-02-10 DIAGNOSIS — R509 Fever, unspecified: Secondary | ICD-10-CM | POA: Diagnosis not present

## 2023-02-10 DIAGNOSIS — E785 Hyperlipidemia, unspecified: Secondary | ICD-10-CM | POA: Diagnosis not present

## 2023-02-10 DIAGNOSIS — R519 Headache, unspecified: Secondary | ICD-10-CM | POA: Diagnosis not present

## 2023-02-10 DIAGNOSIS — R109 Unspecified abdominal pain: Secondary | ICD-10-CM | POA: Diagnosis not present

## 2023-02-10 DIAGNOSIS — I700 Atherosclerosis of aorta: Secondary | ICD-10-CM | POA: Diagnosis not present

## 2023-02-10 DIAGNOSIS — R0602 Shortness of breath: Secondary | ICD-10-CM | POA: Diagnosis not present

## 2023-02-10 DIAGNOSIS — G9389 Other specified disorders of brain: Secondary | ICD-10-CM | POA: Diagnosis not present

## 2023-02-10 DIAGNOSIS — Z9119 Patient's noncompliance with other medical treatment and regimen due to financial hardship: Secondary | ICD-10-CM | POA: Diagnosis not present

## 2023-02-10 DIAGNOSIS — I4891 Unspecified atrial fibrillation: Secondary | ICD-10-CM | POA: Diagnosis not present

## 2023-02-10 DIAGNOSIS — I10 Essential (primary) hypertension: Secondary | ICD-10-CM | POA: Diagnosis not present

## 2023-02-10 DIAGNOSIS — H5340 Unspecified visual field defects: Secondary | ICD-10-CM | POA: Diagnosis not present

## 2023-02-10 DIAGNOSIS — M7989 Other specified soft tissue disorders: Secondary | ICD-10-CM | POA: Diagnosis not present

## 2023-02-10 DIAGNOSIS — S42021D Displaced fracture of shaft of right clavicle, subsequent encounter for fracture with routine healing: Secondary | ICD-10-CM | POA: Diagnosis not present

## 2023-02-10 DIAGNOSIS — K5710 Diverticulosis of small intestine without perforation or abscess without bleeding: Secondary | ICD-10-CM | POA: Diagnosis not present

## 2023-02-10 DIAGNOSIS — M549 Dorsalgia, unspecified: Secondary | ICD-10-CM | POA: Diagnosis not present

## 2023-02-10 DIAGNOSIS — I4892 Unspecified atrial flutter: Secondary | ICD-10-CM | POA: Diagnosis not present

## 2023-02-10 DIAGNOSIS — C50412 Malignant neoplasm of upper-outer quadrant of left female breast: Secondary | ICD-10-CM | POA: Diagnosis not present

## 2023-02-10 DIAGNOSIS — E1142 Type 2 diabetes mellitus with diabetic polyneuropathy: Secondary | ICD-10-CM | POA: Diagnosis not present

## 2023-02-10 DIAGNOSIS — Z86718 Personal history of other venous thrombosis and embolism: Secondary | ICD-10-CM | POA: Diagnosis not present

## 2023-02-10 DIAGNOSIS — Z91148 Patient's other noncompliance with medication regimen for other reason: Secondary | ICD-10-CM | POA: Diagnosis not present

## 2023-02-10 DIAGNOSIS — Z452 Encounter for adjustment and management of vascular access device: Secondary | ICD-10-CM | POA: Diagnosis not present

## 2023-02-10 DIAGNOSIS — M79671 Pain in right foot: Secondary | ICD-10-CM | POA: Diagnosis not present

## 2023-02-10 DIAGNOSIS — Z681 Body mass index (BMI) 19 or less, adult: Secondary | ICD-10-CM | POA: Diagnosis not present

## 2023-02-10 DIAGNOSIS — M542 Cervicalgia: Secondary | ICD-10-CM | POA: Diagnosis not present

## 2023-02-10 DIAGNOSIS — G8929 Other chronic pain: Secondary | ICD-10-CM | POA: Diagnosis not present

## 2023-02-10 DIAGNOSIS — R569 Unspecified convulsions: Secondary | ICD-10-CM

## 2023-02-10 DIAGNOSIS — A419 Sepsis, unspecified organism: Secondary | ICD-10-CM | POA: Diagnosis not present

## 2023-02-10 DIAGNOSIS — Z95828 Presence of other vascular implants and grafts: Secondary | ICD-10-CM | POA: Diagnosis not present

## 2023-02-10 DIAGNOSIS — R410 Disorientation, unspecified: Secondary | ICD-10-CM | POA: Diagnosis not present

## 2023-02-10 DIAGNOSIS — J4489 Other specified chronic obstructive pulmonary disease: Secondary | ICD-10-CM | POA: Diagnosis not present

## 2023-02-10 DIAGNOSIS — R32 Unspecified urinary incontinence: Secondary | ICD-10-CM | POA: Diagnosis not present

## 2023-02-10 DIAGNOSIS — D696 Thrombocytopenia, unspecified: Secondary | ICD-10-CM | POA: Diagnosis not present

## 2023-02-10 DIAGNOSIS — Z8616 Personal history of COVID-19: Secondary | ICD-10-CM | POA: Diagnosis not present

## 2023-02-10 DIAGNOSIS — K4090 Unilateral inguinal hernia, without obstruction or gangrene, not specified as recurrent: Secondary | ICD-10-CM | POA: Diagnosis not present

## 2023-02-10 DIAGNOSIS — G4700 Insomnia, unspecified: Secondary | ICD-10-CM | POA: Diagnosis not present

## 2023-02-10 DIAGNOSIS — G40909 Epilepsy, unspecified, not intractable, without status epilepticus: Secondary | ICD-10-CM | POA: Diagnosis not present

## 2023-02-10 DIAGNOSIS — I69854 Hemiplegia and hemiparesis following other cerebrovascular disease affecting left non-dominant side: Secondary | ICD-10-CM | POA: Diagnosis not present

## 2023-02-10 DIAGNOSIS — J432 Centrilobular emphysema: Secondary | ICD-10-CM | POA: Diagnosis not present

## 2023-02-10 DIAGNOSIS — I480 Paroxysmal atrial fibrillation: Secondary | ICD-10-CM | POA: Diagnosis not present

## 2023-02-10 DIAGNOSIS — F419 Anxiety disorder, unspecified: Secondary | ICD-10-CM | POA: Diagnosis not present

## 2023-02-10 DIAGNOSIS — E8809 Other disorders of plasma-protein metabolism, not elsewhere classified: Secondary | ICD-10-CM | POA: Diagnosis not present

## 2023-02-10 DIAGNOSIS — Z853 Personal history of malignant neoplasm of breast: Secondary | ICD-10-CM | POA: Diagnosis not present

## 2023-02-10 DIAGNOSIS — E876 Hypokalemia: Secondary | ICD-10-CM | POA: Diagnosis not present

## 2023-02-10 DIAGNOSIS — K5901 Slow transit constipation: Secondary | ICD-10-CM | POA: Diagnosis not present

## 2023-02-10 DIAGNOSIS — Z91141 Patient's other noncompliance with medication regimen due to financial hardship: Secondary | ICD-10-CM | POA: Diagnosis not present

## 2023-02-10 LAB — URINALYSIS, ROUTINE W REFLEX MICROSCOPIC
Bilirubin Urine: NEGATIVE
Glucose, UA: NEGATIVE mg/dL
Ketones, ur: 80 mg/dL — AB
Leukocytes,Ua: NEGATIVE
Nitrite: NEGATIVE
Protein, ur: 30 mg/dL — AB
Specific Gravity, Urine: 1.025 (ref 1.005–1.030)
pH: 5 (ref 5.0–8.0)

## 2023-02-10 LAB — COMPREHENSIVE METABOLIC PANEL
ALT: 12 U/L (ref 0–44)
AST: 17 U/L (ref 15–41)
Albumin: 3.4 g/dL — ABNORMAL LOW (ref 3.5–5.0)
Alkaline Phosphatase: 47 U/L (ref 38–126)
Anion gap: 13 (ref 5–15)
BUN: 12 mg/dL (ref 8–23)
CO2: 23 mmol/L (ref 22–32)
Calcium: 8.5 mg/dL — ABNORMAL LOW (ref 8.9–10.3)
Chloride: 104 mmol/L (ref 98–111)
Creatinine, Ser: 0.78 mg/dL (ref 0.44–1.00)
GFR, Estimated: >60 mL/min (ref >60)
Glucose, Bld: 85 mg/dL (ref 70–99)
Potassium: 3 mmol/L — ABNORMAL LOW (ref 3.5–5.1)
Sodium: 140 mmol/L (ref 135–145)
Total Bilirubin: 1 mg/dL (ref 0.3–1.2)
Total Protein: 6.1 g/dL — ABNORMAL LOW (ref 6.5–8.1)

## 2023-02-10 LAB — CBC
HCT: 36.9 % (ref 36.0–46.0)
Hemoglobin: 12 g/dL (ref 12.0–15.0)
MCH: 33.4 pg (ref 26.0–34.0)
MCHC: 32.5 g/dL (ref 30.0–36.0)
MCV: 102.8 fL — ABNORMAL HIGH (ref 80.0–100.0)
Platelets: 108 10*3/uL — ABNORMAL LOW (ref 150–400)
RBC: 3.59 MIL/uL — ABNORMAL LOW (ref 3.87–5.11)
RDW: 12.2 % (ref 11.5–15.5)
WBC: 5.4 10*3/uL (ref 4.0–10.5)
nRBC: 0 % (ref 0.0–0.2)

## 2023-02-10 LAB — VITAMIN B12: Vitamin B-12: 206 pg/mL (ref 180–914)

## 2023-02-10 LAB — AMMONIA: Ammonia: 33 umol/L (ref 9–35)

## 2023-02-10 MED ORDER — SODIUM CHLORIDE 0.9 % IV SOLN
250.0000 mg | Freq: Once | INTRAVENOUS | Status: DC
  Filled 2023-02-10: qty 5

## 2023-02-10 MED ORDER — SODIUM CHLORIDE 0.9 % IV SOLN
2000.0000 mg | Freq: Once | INTRAVENOUS | Status: AC
  Administered 2023-02-10: 2000 mg via INTRAVENOUS
  Filled 2023-02-10: qty 20

## 2023-02-10 MED ORDER — LEVETIRACETAM IN NACL 500 MG/100ML IV SOLN
500.0000 mg | Freq: Two times a day (BID) | INTRAVENOUS | Status: DC
  Administered 2023-02-11 – 2023-02-12 (×5): 500 mg via INTRAVENOUS
  Filled 2023-02-10 (×5): qty 100

## 2023-02-10 MED ORDER — GABAPENTIN 100 MG PO CAPS
100.0000 mg | ORAL_CAPSULE | Freq: Three times a day (TID) | ORAL | Status: DC
  Administered 2023-02-10 – 2023-02-19 (×28): 100 mg via ORAL
  Filled 2023-02-10 (×28): qty 1

## 2023-02-10 MED ORDER — POTASSIUM CHLORIDE 2 MEQ/ML IV SOLN
INTRAVENOUS | Status: DC
  Filled 2023-02-10: qty 1000

## 2023-02-10 MED ORDER — PHENYTOIN SODIUM EXTENDED 30 MG PO CAPS
130.0000 mg | ORAL_CAPSULE | Freq: Every day | ORAL | Status: DC
  Administered 2023-02-11 – 2023-02-19 (×9): 130 mg via ORAL
  Filled 2023-02-10 (×9): qty 1

## 2023-02-10 MED ORDER — PHENYTOIN SODIUM EXTENDED 100 MG PO CAPS
100.0000 mg | ORAL_CAPSULE | Freq: Every evening | ORAL | Status: DC
  Administered 2023-02-10 – 2023-02-18 (×9): 100 mg via ORAL
  Filled 2023-02-10 (×10): qty 1

## 2023-02-10 MED ORDER — SODIUM CHLORIDE 0.9% FLUSH
10.0000 mL | Freq: Two times a day (BID) | INTRAVENOUS | Status: DC
  Administered 2023-02-10 – 2023-02-13 (×7): 10 mL

## 2023-02-10 MED ORDER — POTASSIUM CHLORIDE 2 MEQ/ML IV SOLN: INTRAVENOUS | Status: DC

## 2023-02-10 MED ORDER — SODIUM CHLORIDE 0.9% FLUSH: 10.0000 mL | INTRAVENOUS | Status: DC | PRN

## 2023-02-10 MED ORDER — PHENYTOIN SODIUM EXTENDED 100 MG PO CAPS: 100.0000 mg | ORAL_CAPSULE | Freq: Three times a day (TID) | ORAL | Status: DC

## 2023-02-10 MED ORDER — VITAMIN B-12 1000 MCG PO TABS
2000.0000 ug | ORAL_TABLET | Freq: Every day | ORAL | Status: DC
  Administered 2023-02-10 – 2023-02-19 (×10): 2000 ug via ORAL
  Filled 2023-02-10 (×10): qty 2

## 2023-02-10 MED ORDER — CHLORHEXIDINE GLUCONATE CLOTH 2 % EX PADS
6.0000 | MEDICATED_PAD | Freq: Every day | CUTANEOUS | Status: DC
  Administered 2023-02-10 – 2023-02-18 (×9): 6 via TOPICAL

## 2023-02-10 MED ORDER — POTASSIUM CHLORIDE CRYS ER 20 MEQ PO TBCR
40.0000 meq | EXTENDED_RELEASE_TABLET | Freq: Four times a day (QID) | ORAL | Status: DC
  Administered 2023-02-10: 40 meq via ORAL
  Filled 2023-02-10: qty 2

## 2023-02-10 MED ORDER — LORAZEPAM 2 MG/ML IJ SOLN: 2.0000 mg | Freq: Four times a day (QID) | INTRAMUSCULAR | Status: DC | PRN

## 2023-02-10 NOTE — Progress Notes (Signed)
During seizure

## 2023-02-10 NOTE — TOC Initial Note (Signed)
Transition of Care [facility]) - Initial/Assessment Note    Patient Details  Name: [name] MRN: [id number] Date of Birth: 1955/10/10  [facility]) CM/SW Contact:    Lawerance Sabal, RN Phone Number: 02/10/2023, 11:42 AM  Clinical Narrative:                  Patient hospitalized for seizures, lapse in vimpat, per notes from neurology office this has been corrected, and they will also have samples available.  Patient is from home w her daughter, anticipate return to home at discharge.     Barriers to Discharge: Continued Medical Work up   Patient Goals and CMS Choice            Expected Discharge Plan and Services   Discharge Planning Services: CM Consult   Living arrangements for the past 2 months: Single Family Home                                      Prior Living Arrangements/Services Living arrangements for the past 2 months: Single Family Home Lives with:: Adult Children                   Activities of Daily Living      Permission Sought/Granted                  Emotional Assessment              Admission diagnosis:  Seizures (HCC) [R56.9] Noncompliance with medications [Z91.148] Seizure (HCC) [R56.9] Patient Active Problem List   Diagnosis Date Noted   Seizure (HCC) 02/10/2023   Seizures (HCC) 02/09/2023   Genetic testing 01/12/2023   Malignant neoplasm of upper-outer quadrant of left breast in female, estrogen receptor positive (HCC) 12/30/2022   Atelectasis 12/23/2022   Hyperlipidemia, unspecified 12/23/2022   Hypertension 12/23/2022   Folate deficiency 08/06/2022   Low vitamin B12 level 08/06/2022   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   Stress incontinence 07/29/2022   Left wrist pain 07/12/2022   Toe pain, left 02/11/2021   DVT (deep venous thrombosis) (HCC) 10/10/2020   Frequent falls 10/10/2020   Dysuria 10/10/2020   PAF (paroxysmal atrial fibrillation) (HCC) 10/10/2020   Elevated Dilantin level  08/26/2019   Pain in left foot 01/04/2019   Weakness of both lower extremities    Generalized weakness 05/25/2018   Pain    Chronic back pain    Neuropathy    Orthostasis    Tobacco abuse    Left-sided weakness 03/19/2018   Localized swelling of lower extremity 02/17/2017   GERD (gastroesophageal reflux disease) 02/17/2017   Gait disturbance 11/02/2015   Right clavicle fracture 11/02/2015   UTI (urinary tract infection) 11/02/2015   Chest pain 03/31/2015   SOB (shortness of breath) 03/31/2015   Malnutrition of moderate degree (HCC) 03/31/2015   COPD (chronic obstructive pulmonary disease) (HCC)    Unintentional weight loss 01/11/2015   Anxiety 10/22/2014   Asthma 05/15/2014   Protein calorie malnutrition (HCC) 05/15/2014   Arteriovenous malformation of brain 11/17/2013   Partial epilepsy with impairment of consciousness, intractable (HCC) 11/17/2013   History of hemorrhagic cerebrovascular accident (CVA) without residual deficits 11/17/2013   DDD (degenerative disc disease) 06/12/2013   Paresthesias 06/12/2013   Tremor 06/09/2013   Hypokalemia 01/23/2013   Leukopenia 12/14/2012   Thrombocytopenia- chronic 06/29/2012   Noncompliance with medication treatment due to underuse of medication  06/29/2012   Stress-personal 06/29/2012   Skull fracture (HCC) 02/28/2012   Dilantin toxicity 02/28/2012   Seizure disorder (HCC) 02/28/2012   PCP:  [name], FNP Pharmacy:   [facility]) - [place], [place] - [address] [facility] [address] [place] [address] [place] [address] Phone: [phone] Fax: [phone]  [id number] [place], [place] - [address] AT [facility] [address] & [facility] [address] [place] [address] Phone: [phone] Fax: [phone]  [facility] [place] - [place], [place] - [address] [place] [address] Phone: [phone] Fax:  [phone]  [facility] #[id number] [place], [place] - [address] AT [facility] OF [facility] & [address] [place] [address] Phone: [phone] Fax: [phone]  [facility] - [place], [place] - [address] [place] [address] Phone: [phone] Fax: [phone]  [facility] - [place], [place] - [facility] AT Portal to Registered [address] [place] [address] Phone: [phone] Fax: [phone]  [facility] [address] [place] [address] Phone: [phone] Fax: [phone]     Social Determinants of Health (SDOH) Social History: SDOH Screenings   Food Insecurity: No Food Insecurity (12/31/2022)  Housing: Low Risk  (12/31/2022)  Transportation Needs: No Transportation Needs (12/31/2022)  Utilities: Not At Risk (07/29/2022)  Financial Resource Strain: Low Risk  (12/31/2022)  Tobacco Use: Medium Risk (02/09/2023)   SDOH Interventions:     Readmission Risk Interventions     No data to display

## 2023-02-10 NOTE — Procedures (Signed)
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 She presents with breakthrough seizures in the setting of medication noncompliance due to changes in insurance causing delay in receiving her Vimpat.  EEG to evaluate for seizure   Level of alertness: Awake, asleep   AEDs during EEG study: LCM, PHT, GBP, Ativan   Technical aspects: This EEG study was done with scalp electrodes positioned according to the 10-20 International system of electrode placement. Electrical activity was reviewed with band pass filter of 1-70Hz , sensitivity of 7 uV/mm, display speed of 55mm/sec with a 60Hz  notched filter applied as appropriate. EEG data were recorded continuously and digitally stored.  Video monitoring was available and reviewed as appropriate.   Description: The posterior dominant rhythm consists of 8-9 Hz activity of moderate voltage (25-35 uV) seen predominantly in posterior head regions, symmetric and reactive to eye opening and eye closing. Sleep was characterized by sleep spindles (12-14hz ), maximal fronto-central region. EEG showed continuous polymorphic sharply contoured rhythmic 3 to 6 Hz theta-delta slowing in right hemisphere, maximal right posterior quadrant with waxing and waning morphology. Lateralized periodic discharges with overriding fast activity were noted in right hemisphere, maximal right posterior quadrant, at 1Hz . Independent left anterior temporal spikes were also noted. Hyperventilation and photic stimulation were not performed.     56 seizures were noted. No clinical signs were seen. EEG showed 3-5Hz  theta-delta slowing in right posterior quadrant which the involves into right frontotemporal region  admixed with sharp waves at 3-4hz . EEG then showed high amplitude sharply contoured 12-13Hz  beta activity and involved left hemisphere. Subsequently EEG showed 2-3Hz  delta slowing. Average duration of seizures was 45 seconds to 1 minute. Last seizure was noted on 02/10/2023 at 1313.    ABNORMALITY - Seizure without clinical signs, right posterior quadrant - Lateralized periodic discharges with overriding fast activity, right hemisphere, maximal right posterior quadrant ( LPD+F) - Spike, left anterior temporal - Continuous rhythmic slow, right hemisphere, maximal right posterior quadrant   IMPRESSION: This study showed 56 seizures without clinical signs arising from  right posterior quadrant, lasting about 45 seconds to 1 minute. Last seizure was noted on 02/10/2023 at 1313.  Additionally there was evidence of epileptogenicity and cortical dysfunction arising from right hemisphere, maximal right posterior quadrant. There was also independent epileptogenicity arising from left anterior temporal region.   Dr. Otelia Limes was notified.   [name]

## 2023-02-10 NOTE — Telephone Encounter (Signed)
Pt's daughter returned call and advised pt is not doing well. Pt has had more seizures while in hospital and has had prolonged "unresponsive episodes after seizures which is abnormal for her". Daughter [name] requested c/b Friday 7/12 to check in and see if pt has been discharged to schedule.

## 2023-02-10 NOTE — Progress Notes (Signed)
PROGRESS NOTE                                                                                                                                                                                                             Patient Demographics:    [name], is a 67 y.o. female, DOB - 10/10/1955, ZOX:[id number]  Outpatient Primary MD for the patient is [name], FNP    LOS - 0  Admit date - 02/09/2023    Chief Complaint  Patient presents with   Seizures       Brief Narrative (HPI from H&P)     67 y.o. female with medical history significant of thrombocytopenia, paroxysmal A-fib, hypertension, hyperlipidemia, GERD, DVT, chronic pain, COPD, anxiety, hemorrhagic stroke, stress incontinence, seizure disorder, neuropathy, breast cancer presenting after multiple seizures at home.  Orting to the family members patient was compliant with her Dilantin dose and was taking it on a daily basis however she did run out of Vimpat few days ago due to insurance problems, in the ER Dilantin levels were low, she was seen by neurology and admitted for ongoing seizures.   Subjective:    [name] today, is somnolent Post seizure unable to answer questions or follow commands   Assessment  & Plan :    Breakthrough seizures.  Ran out of Vimpat but was taking Dilantin as prescribed, Dilantin levels were still low, seen by neurology, head CT nonacute, EEG does not show any acute seizures but does show evidence of epileptogenicity and cortical dysfunction, had another seizure morning of 02/10/2023, increase Dilantin dose and switch to IV, continue Vimpat, as needed Ativan, undergoing prolonged EEG.  Defer further management to neurology.  Mild chronic thrombocytopenia.  Stable.  Stress incontinence.  On oxybutynin  Chronic pain.  On tramadol.  Neuropathy.  Lyrica switched to Neurontin, discharged on Neurontin.  Hypokalemia.  Replace       Condition - Extremely Guarded  Family Communication  :  daughter [phone]  02/10/23  Code Status :  Full  Consults  :  Neuro  PUD Prophylaxis :     Procedures  :     EEG - This study showed evidence of epileptogenicity and cortical dysfunction arising from right hemisphere, maximal right posterior quadrant. This EEG pattern is on the ictal-interictal continuum with potential for seizures.  CT - 1.  No evidence of an acute intracranial abnormality. 2. 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 Correlate with the surgical history. 3. Right maxillary sinusitis. 4. Small-volume fluid within the left mastoid air cells.      Disposition Plan  :    Status is: Observation  DVT Prophylaxis  :    enoxaparin (LOVENOX) injection 40 mg Start: 02/09/23 1800    Lab Results  Component Value Date   PLT 108 (L) 02/10/2023    Diet :  Diet Order             Diet NPO time specified Except for: Sips with Meds  Diet effective now                    Inpatient Medications  Scheduled Meds:  enoxaparin (LOVENOX) injection  40 mg Subcutaneous Q24H   gabapentin  100 mg Oral TID   lacosamide  200 mg Oral BID   oxybutynin  5 mg Oral QHS   phenytoin  100 mg Oral QPM   phenytoin  130 mg Oral Daily   sodium chloride flush  3 mL Intravenous Q12H   Continuous Infusions:  dextrose 5 % 1,000 mL with potassium chloride 40 mEq/L Pediatric IV infusion     PRN Meds:.acetaminophen **OR** acetaminophen, LORazepam, polyethylene glycol  Antibiotics  :    Anti-infectives (From admission, onward)    None         Objective:   Vitals:   02/10/23 0657 02/10/23 0704 02/10/23 0705 02/10/23 0755  BP: 137/60 137/60 (!) 119/55 125/61  Pulse: 100 87 91 (!) 110  Resp: 20 19 20  (!) 27  Temp:      TempSrc:      SpO2: (!) 81% 95% 95% 99%  Weight:        Wt Readings from Last 3 Encounters:   02/09/23 56.4 kg  01/12/23 58.4 kg  12/31/22 59.4 kg     Intake/Output Summary (Last 24 hours) at 02/10/2023 0900 Last data filed at 02/10/2023 0557 Gross per 24 hour  Intake --  Output 150 ml  Net -150 ml     Physical Exam  Postictal, unable to follow commands, moves all 4 extremities to painful stimuli,    [place].AT,PERRAL Supple Neck, No JVD,   Symmetrical Chest wall movement, Good air movement bilaterally, CTAB RRR,No Gallops,Rubs or new Murmurs,  +ve B.Sounds, Abd Soft, No tenderness,   No Cyanosis, Clubbing or edema       Data Review:    Recent Labs  Lab 02/09/23 1128 02/10/23 0318  WBC 5.4 5.4  HGB 12.9 12.0  HCT 39.0 36.9  PLT 118* 108*  MCV 103.4* 102.8*  MCH 34.2* 33.4  MCHC 33.1 32.5  RDW 12.4 12.2    Recent Labs  Lab 02/09/23 1128 02/10/23 0318  NA 141 140  K 3.4* 3.0*  CL 104 104  CO2 22 23  ANIONGAP 15 13  GLUCOSE 110* 85  BUN 15 12  CREATININE 1.08* 0.78  AST 23 17  ALT 15 12  ALKPHOS 51 47  BILITOT 0.7 1.0  ALBUMIN 3.8 3.4*  CALCIUM 9.0 8.5*    Micro Results No results found for this or any previous visit (from the past 240 hour(s)).  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 She presents with breakthrough seizures in the setting of medication noncompliance due to changes in insurance causing delay in receiving her Vimpat.  EEG to evaluate for seizure Level of alertness: Awake AEDs during EEG study: LCM, PHT, Ativan Technical aspects: This EEG study was done with scalp electrodes positioned according to the 10-20 International system of electrode placement.  Electrical activity was reviewed with band pass filter of 1-70Hz , sensitivity of 7 uV/mm, display speed of 71mm/sec with a 60Hz  notched filter applied as appropriate. EEG data were recorded continuously and digitally stored.  Video monitoring was available and reviewed as appropriate. Description: The posterior dominant rhythm consists of 8-9 Hz activity of moderate voltage (25-35 uV) seen predominantly in posterior head regions, symmetric and reactive to eye opening and eye closing. EEG showed continuous polymorphic sharply contoured rhythmic 3 to 6 Hz theta-delta slowing in right hemisphere, maximal right posterior quadrant with waxing and waning morphology. Abundant spike and waves were noted in right hemisphere, maximal right posterior quadrant, at times qasi- periodic at 1-2Hz . Frequently, the right hemispheric spikes also involved the left  frontal region. Hyperventilation and photic stimulation were not performed.   ABNORMALITY - Spike and waves, right hemisphere, maximal right posterior quadrant - Continuous rhythmic slow, right hemisphere, maximal right posterior quadrant IMPRESSION: This study showed evidence of epileptogenicity and cortical dysfunction arising from right hemisphere, maximal right posterior quadrant. This EEG pattern is on the ictal-interictal continuum with potential for seizures. No definite seizures were seen throughout the recording. Dr. Otelia Limes was notified. Charlsie Quest   DG Chest Portable 1 View  Result Date: 02/09/2023 CLINICAL DATA:  Cough. EXAM: PORTABLE CHEST 1 VIEW COMPARISON:  October 07, 2022. FINDINGS: The heart size and mediastinal contours are within normal limits. Both lungs are clear. The visualized skeletal structures are unremarkable. IMPRESSION: No active disease. Electronically Signed   By: Lupita Raider M.D.   On: 02/09/2023 13:37   CT HEAD WO CONTRAST ( )  Result Date: 02/09/2023 CLINICAL DATA:  Provided history: Seizure disorder, clinical change. EXAM:  CT HEAD WITHOUT CONTRAST TECHNIQUE: Contiguous axial images were obtained from the base of the skull through the vertex without intravenous contrast. RADIATION DOSE REDUCTION: This exam was performed according to the departmental dose-optimization program which includes automated exposure control, adjustment of the mA and/or kV according to patient size and/or use of iterative reconstruction technique. COMPARISON:  Prior head CT examinations 11/04/2022 and earlier. FINDINGS: Brain: Redemonstrated large focus of chronic encephalomalacia/gliosis within the posterior right cerebral hemisphere (underlying a cranioplasty). There is no acute intracranial hemorrhage. No acute demarcated cortical infarct. No extra-axial fluid collection. No evidence of an intracranial mass. No midline shift. Vascular: No hyperdense vessel.  Atherosclerotic calcifications. Skull: Right parietooccipital cranioplasty. No acute calvarial fracture or aggressive osseous lesion. Sinuses/Orbits: No orbital mass or acute orbital finding. Small to moderate-sized fluid level, and mild background mucosal thickening, within the right maxillary sinus. Other: Small-volume fluid within the left mastoid air cells. IMPRESSION: 1.  No evidence of an acute intracranial abnormality. 2. 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 Correlate with the surgical history. 3. Right maxillary sinusitis. 4. Small-volume fluid within the left mastoid air cells. Electronically Signed   By: Jackey Loge D.O.   On: 02/09/2023 12:55  Signature  -   Susa Raring M.D on 02/10/2023 at 9:00 AM   -  To page go to [email or web address]

## 2023-02-10 NOTE — Evaluation (Signed)
Clinical/Bedside Swallow Evaluation Patient Details  Name: [name] MRN: [id number] Date of Birth: Apr 29, 1956  Today's Date: 02/10/2023 Time: SLP Start Time (ACUTE ONLY): 1533 SLP Stop Time (ACUTE ONLY): 1545 SLP Time Calculation (min) (ACUTE ONLY): 12 min  Past Medical History:  Past Medical History:  Diagnosis Date   Acute bronchitis 08/06/2022   Acute respiratory failure with hypoxia (HCC) 03/31/2015   Altered mental status    Asthma    Ataxia 08/26/2019   Atypical chest pain 02/17/2017   Cerebral hemorrhage (HCC) 1989   Chronic back pain    Closed fracture of distal end of left radius 07/17/2022   Closed fracture of fifth metatarsal bone 01/04/2019   Closed fracture of proximal phalanx of great toe 01/04/2019   Closed nondisplaced fracture of styloid process of left radius 08/06/2022   COPD (chronic obstructive pulmonary disease) (HCC)    no meds per daughter   COPD with exacerbation (HCC) 03/30/2015   Costochondritis 02/17/2017   COVID-19 virus infection 04/02/2020   DDD (degenerative disc disease) 06/12/2013   DVT (deep venous thrombosis) (HCC)    "she's had several since 1990"   GERD (gastroesophageal reflux disease)    no meds per daughter   Headache    "usually around time when she's had a seizure"   History of blood transfusion    "when she was a baby"   History of kidney stones    History of stomach ulcers    Hypoxia 01/18/2015   Lactic acidosis 04/02/2020   Neuropathy    Seizures (HCC)    "because of her brain surgery"  last one 07/28/22   Sepsis secondary to UTI (HCC) 01/17/2015   Stroke [facility]) 1990's   family denies residual on 11/09/2014   Tunnel vision    "since brain OR"   Past Surgical History:  Past Surgical History:  Procedure Laterality Date   BRAIN SURGERY  1989   craniotomy with hematoma evacuation   BREAST BIOPSY Left 12/22/2022   [place] LT BREAST BX W LOC DEV 1ST LESION IMG BX SPEC US GUIDE 12/22/2022 [facility]   BREAST BIOPSY   01/09/2023   MM LT RADIOACTIVE SEED LOC MAMMO GUIDE 01/09/2023 [facility]   BREAST LUMPECTOMY WITH RADIOACTIVE SEED AND SENTINEL LYMPH NODE BIOPSY Left 01/12/2023   Procedure: LEFT BREAST LUMPECTOMY WITH RADIOACTIVE SEED AND SENTINEL LYMPH NODE BIOPSY;  Surgeon: Manus Rudd, MD;  [facility];  Service: General;  Laterality: Left;   CESAREAN SECTION  1979; 1987; 1989   HEMORRHOID SURGERY     IVC FILTER INSERTION N/A 10/12/2020   Procedure: IVC FILTER INSERTION;  Surgeon: Chuck Hint, MD;  Location: [facility] INVASIVE CV LAB;  Service: Cardiovascular;  Laterality: N/A;   OPEN REDUCTION INTERNAL FIXATION (ORIF) DISTAL RADIAL FRACTURE Left 08/25/2022   Procedure: OPEN REDUCTION INTERNAL FIXATION (ORIF) DISTAL RADIUS FRACTURE of nascent malunion;  Surgeon: Bradly Bienenstock, MD;  Location: [facility] OR;  Service: Orthopedics;  Laterality: Left;  regional with iv sedation   TUBAL LIGATION  1990's   HPI:  67 y.o. female presenting after multiple seizures at home.  Orting to the family members patient was compliant with her Dilantin dose and was taking it on a daily basis however she did run out of Vimpat few days ago due to insurance problems, in the ER Dilantin levels were low, she was seen by neurology and admitted for ongoing seizures. Pt with medical history significant of thrombocytopenia, paroxysmal A-fib, hypertension, hyperlipidemia, GERD, DVT, chronic pain,  COPD, anxiety, hemorrhagic stroke, stress incontinence, seizure disorder, neuropathy, breast cancer    Assessment / Plan / Recommendation  Clinical Impression  Pt demonstrates normal arousal, able to self feed. Noted to have a healing bite on her tongue from seizure. Pt is able to masticate despite wound on tongue and also no bottom dentures. Denies any difficulty with solids at baseline. No coughing. Pt does have intermittent throat clearing but does not apepar impaired or at risk. Will sign off. SLP Visit Diagnosis:  Dysphagia, oropharyngeal phase (R13.12)    Aspiration Risk  Mild aspiration risk    Diet Recommendation Regular;Thin liquid    Liquid Administration via: Straw Medication Administration: Whole meds with liquid Supervision: Patient able to self feed Postural Changes: Seated upright at 90 degrees    Other  Recommendations      Recommendations for follow up therapy are one component of a multi-disciplinary discharge planning process, led by the attending physician.  Recommendations may be updated based on patient status, additional functional criteria and insurance authorization.  Follow up Recommendations No SLP follow up      Assistance Recommended at Discharge    Functional Status Assessment    Frequency and Duration            Prognosis        Swallow Study   General HPI: 67 y.o. female presenting after multiple seizures at home.  Orting to the family members patient was compliant with her Dilantin dose and was taking it on a daily basis however she did run out of Vimpat few days ago due to insurance problems, in the ER Dilantin levels were low, she was seen by neurology and admitted for ongoing seizures. Pt with medical history significant of thrombocytopenia, paroxysmal A-fib, hypertension, hyperlipidemia, GERD, DVT, chronic pain, COPD, anxiety, hemorrhagic stroke, stress incontinence, seizure disorder, neuropathy, breast cancer Type of Study: Bedside Swallow Evaluation Diet Prior to this Study: NPO Temperature Spikes Noted: No Respiratory Status: Room air History of Recent Intubation: No Behavior/Cognition: Alert;Cooperative;Pleasant mood Oral Cavity Assessment: Within Functional Limits Oral Care Completed by SLP: No Oral Cavity - Dentition: Other (Comment);Edentulous;Dentures, top (tongue) Vision: Functional for self-feeding Self-Feeding Abilities: Able to feed self Patient Positioning: Upright in bed Baseline Vocal Quality: Normal Volitional Cough:  Strong Volitional Swallow: Able to elicit    Oral/Motor/Sensory Function Overall Oral Motor/Sensory Function: Within functional limits   Ice Chips     Thin Liquid Thin Liquid: Within functional limits Presentation: Straw;Self Fed    Nectar Thick Nectar Thick Liquid: Not tested   Honey Thick Honey Thick Liquid: Not tested   Puree Puree: Within functional limits   Solid     Solid: Within functional limits Presentation: Self Fed      Modesty Rudy, Riley Nearing 02/10/2023,3:53 PM

## 2023-02-10 NOTE — Telephone Encounter (Signed)
LVM for daughter to r/s pt's appt since she is currently hospitalized. Per Dr. Roselind Messier, pt appts to be moved 1 week to week of 7/15. Will advise daughter of this when call is returned.

## 2023-02-10 NOTE — Progress Notes (Signed)
Peripherally Inserted Central Catheter Placement  The IV Nurse has discussed with the patient and/or persons authorized to consent for the patient, the purpose of this procedure and the potential benefits and risks involved with this procedure.  The benefits include less needle sticks, lab draws from the catheter, and the patient may be discharged home with the catheter. Risks include, but not limited to, infection, bleeding, blood clot (thrombus formation), and puncture of an artery; nerve damage and irregular heartbeat and possibility to perform a PICC exchange if needed/ordered by physician.  Alternatives to this procedure were also discussed.  Bard Power PICC patient education guide, fact sheet on infection prevention and patient information card has been provided to patient /or left at bedside. Daughter signed consent per telephone.   PICC Placement Documentation  PICC Double Lumen 02/10/23 Right Brachial 36 cm 0 cm (Active)  Indication for Insertion or Continuance of Line Limited venous access - need for IV therapy >5 days (PICC only) 02/10/23 1810  Exposed Catheter (cm) 0 cm 02/10/23 1810  Site Assessment Clean, Dry, Intact 02/10/23 1810  Lumen #1 Status Flushed;Saline locked;Blood return noted 02/10/23 1810  Lumen #2 Status Flushed;Saline locked;Blood return noted 02/10/23 1810  Dressing Type Transparent;Securing device 02/10/23 1810  Dressing Status Antimicrobial disc in place 02/10/23 1810  Safety Lock Not Applicable 02/10/23 1810  Line Care Connections checked and tightened 02/10/23 1810  Line Adjustment (NICU/IV Team Only) No 02/10/23 1810  Dressing Intervention New dressing 02/10/23 1810  Dressing Change Due 02/17/23 02/10/23 1810       Vernona Rieger  Larrie Fraizer 02/10/2023, 6:12 PM

## 2023-02-10 NOTE — Progress Notes (Signed)
Neurology Progress Note  Brief HPI: [name] is a 67 y.o. 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  Semiology of seizures is generalized tonic clonic but historically, she has had prodrome of headache, burning sensation of left arm, left arm jerking or no warning whatsoever.  Right posterior temporal epileptiform discharges found on EEG during EMU stay in 2014 but there were also left anterior temporal epileptiform discharges less frequently.  She has tried a number of medications for seizure prevention, including gabapentin, Xcopri, but has required Dilantin to stay seizure-free.   She usually takes 130 mg in the morning and 100 mg in the evening of Dilantin as well as Vimpat 200 mg twice daily.  Last seen in office by Dr. Karel Jarvis in May 2024. At that time, Vimpat, Dilantin, and Xcopri had been utilized for seizure prophylaxis but she had to discontinue Xcopri due to gait dysequilibrium.    Current regimen prior to this admission: dilantin 130-100mg  , vimpat 200-200mg . She changed insurances and was without vimpat x 2 days when she began having breakthrough GTC seizures. She had one on Saturday, another one on Sunday, multiple on 7/8 prompting emergency room evaluation. 7/8- administered ativan and loaded with 200 mg vimpat after second witnessed seizure in ED     Prior AEDs:  Phenobarbital (ineffective, mood issues) Depakote (multiple trials, ineffective) Keppra (ineffective) Zonisamide (side effects on brief trial of higher dose than prescribed - 500mg  instead of 200mg ) Onfi (mouth sores) Tegretol (ineffective) Lamictal (nausea, dizziness, malaise) Felbatol (nausea, dizziness, malaise) Topamax (ineffective) Tiagabine (tried per notes, details unclear) Neurontin (ineffective, nausea) Lyrica (leg pain reported on 25mg  daily, tingling) Breviact  (drowsiness) Fycompa Cenobamate  Subjective: Patient seen in room. LTM EEG reveals that she had 49 sz overnight, lasting 45 s to 1 minute   Exam: Vitals:   02/10/23 0705 02/10/23 0755  BP: (!) 119/55 125/61  Pulse: 91 (!) 110  Resp: 20 (!) 27  Temp:    SpO2: 95% 99%   Gen: In bed, NAD Resp: non-labored breathing, no acute distress Abd: soft, nt  NEURO:  Mental Status: AA&Ox2 (disoriented to time and situation).  Language: Speech is clear. Drowsy, will answer question.  Cranial Nerves:  II: PERRL. Visual fields full to confrontation on right, does not blink to threat on left (baseline LHH) III, IV, VI: EOM in tact. Eyelids elevate symmetrically.  V: Sensation intact V1-3 symmetrically  VII: no facial asymmetry   VIII: hearing intact to voice IX, X: Palate elevates symmetrically. Phonation is normal.  ZO:[id number] shrug 5/5 and symmetrical  XII: tongue is midline without fasciculations. Motor:  RUE 4/5  LUE 3/5 RLE 4/5   LLE 3/5 Sensation: intact to pin prick and light touch BL  Tone: is normal and bulk is normal Coordination: Unable to complete FNF- asterixis noted in bilateral upper extremities left worse than right Gait- deferred   Pertinent Labs: K- 3.0 Phenytoin level 5.6 B12- 206  Imaging Reviewed: CT Head- No acute changes, encephalomalacia right parietoccipital area   EEG 02/09/2023 ABNORMALITY - Spike and waves, right hemisphere, maximal right posterior quadrant - Continuous rhythmic slow, right hemisphere, maximal right posterior quadrant IMPRESSION: This study showed evidence of epileptogenicity and cortical dysfunction arising from right hemisphere, maximal right posterior quadrant. This EEG pattern is on the ictal-interictal continuum with potential for seizures. No definite seizures were seen throughout the recording.  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 She presents with breakthrough seizures in the setting of medication noncompliance due to changes in insurance causing delay in receiving her Vimpat.   - While this lag in medication coverage is likely etiology of breakthrough seizures, she has felt subjective fever and has history of UTI-related breakthrough seizures. No meningismus or headache to suggest meningitic process.  - LTM EEG report for today AM (Tuesday): Seizure without clinical signs, right posterior quadrant; Lateralized periodic discharges with overriding fast activity, right hemisphere, maximal right posterior quadrant ( LPD+F); Spike, left anterior temporal; Continuous rhythmic slow, right hemisphere, maximal right posterior quadrant. LTM EEG impression: This study showed 49 seizures without clinical signs arising from  right posterior quadrant, lasting about 45 seconds to 1 minute. Additionally there was evidence of epileptogenicity and cortical dysfunction arising from right hemisphere, maximal right posterior quadrant. There was also independent epileptogenicity arising from left anterior temporal region.  - Overall impression: Breakthrough generalized tonic clonic seizure activity: medication noncompliance +/- occult infectious etiology. Less likely neoplastic disease, but entertained metastatic disease in setting of newly diagnosed stage I left ductal cell carcinoma. Examination unchanged and staging of this makes this less likely. CT head also without acute changes.No recent trauma.    Recommendations: -infectious workup per primary team, UA pending  -continue home medication regimen: vimpat 200mg  bid + dilantin 130mg  in AM and 100mg  in PM (see Dr. Rosalyn Gess note from May 2024, dilantin toxicity has been an issue so regardless of seizure recurrence, DO NOT recommend increasing dilantin even in presence of subtherapeutic levels)  - Neurontin 100 mg TID was added overnight - Although Keppra is listed in HPI as  previously being ineffective, with side effect apparently of seizures, will consider initiating another trial of this medication, administered IV following a load, given frequent electrographic seizures on LTM EEG. Propose Loading 2000 mg IV x 1, followed by 500 mg IV BID. Risks/benefits discussed with the patient and informed consent obtained.  - Continue LTM EEG -avoid tramadol, as this decreases seizure threshold  -of note, rectal versus nasal route benzo would be helpful, as patient's daughter only has PO lorazepam for breakthrough seizures -Start B12 at 2 mg po every day.  - Ammonia pending   Patient seen and examined by NP/APP.   Elmer Picker, DNP, FNP-BC [facility] Pager: [phone]  Electronically signed: Dr. Caryl Pina

## 2023-02-10 NOTE — Progress Notes (Addendum)
   02/10/23 1426  TOC Brief Assessment  Insurance and Status Reviewed  Patient has primary care physician Yes  Home environment has been reviewed from home w daughter  Prior level of function: reviewed  Prior/Current Home Services No current home services  Social Determinants of Health Reivew SDOH reviewed no interventions necessary  Readmission risk has been reviewed Yes  Transition of care needs no transition of care needs at this time   Admitted for seizures, had problems getting vimpat due to insurance change, this has been resolved moving forward by the neurology office per their communication under chart review with the daughter yesterday

## 2023-02-10 NOTE — Progress Notes (Addendum)
Dilantin level came back 5.6 today with albumin of 3.4. Corrected level is around 7. Pt has a hx of Dilantin toxicity. Avoid titrating dose per neurology. Cont current regimen.  Ulyses Southward, PharmD, BCIDP, AAHIVP, CPP Infectious Disease Pharmacist 02/10/2023 9:10 AM

## 2023-02-10 NOTE — Progress Notes (Signed)
MD notified of patient experiencing seizure like activity including shanking of the upper torso and both extremities. See flow sheets for vitals. Episode lasted approximately 30-35 seconds. Instructed by MD singh to give ativan see MAR.

## 2023-02-10 NOTE — Progress Notes (Signed)
LTM maint complete - no skin breakdown under: FP2, REF

## 2023-02-10 NOTE — Plan of Care (Addendum)
EEG monitoring notified me of EEG changes overnight. Adding Neurontin 100 TID (was supposed to be on Lyrica per Dr. Rosalyn Gess note of 12/10/22 but has allergy/intolerance to it documented from 2017).  Neurology to follow exam and EEG read from overnight.  -- Milon Dikes, MD Neurologist [facility] Pager: [phone]

## 2023-02-11 ENCOUNTER — Ambulatory Visit: Payer: Medicare HMO

## 2023-02-11 ENCOUNTER — Ambulatory Visit: Payer: Medicare HMO | Admitting: Radiation Oncology

## 2023-02-11 DIAGNOSIS — Z91148 Patient's other noncompliance with medication regimen for other reason: Secondary | ICD-10-CM | POA: Diagnosis not present

## 2023-02-11 DIAGNOSIS — D0512 Intraductal carcinoma in situ of left breast: Secondary | ICD-10-CM

## 2023-02-11 DIAGNOSIS — R569 Unspecified convulsions: Secondary | ICD-10-CM | POA: Diagnosis not present

## 2023-02-11 LAB — COMPREHENSIVE METABOLIC PANEL
ALT: 14 U/L (ref 0–44)
AST: 16 U/L (ref 15–41)
Albumin: 3.4 g/dL — ABNORMAL LOW (ref 3.5–5.0)
Alkaline Phosphatase: 45 U/L (ref 38–126)
Anion gap: 8 (ref 5–15)
BUN: 6 mg/dL — ABNORMAL LOW (ref 8–23)
CO2: 25 mmol/L (ref 22–32)
Calcium: 8.5 mg/dL — ABNORMAL LOW (ref 8.9–10.3)
Chloride: 104 mmol/L (ref 98–111)
Creatinine, Ser: 0.69 mg/dL (ref 0.44–1.00)
GFR, Estimated: >60 mL/min (ref >60)
Glucose, Bld: 137 mg/dL — ABNORMAL HIGH (ref 70–99)
Potassium: 3 mmol/L — ABNORMAL LOW (ref 3.5–5.1)
Sodium: 137 mmol/L (ref 135–145)
Total Bilirubin: 0.5 mg/dL (ref 0.3–1.2)
Total Protein: 5.9 g/dL — ABNORMAL LOW (ref 6.5–8.1)

## 2023-02-11 LAB — CBC WITH DIFFERENTIAL/PLATELET
Abs Immature Granulocytes: 0.03 10*3/uL (ref 0.00–0.07)
Basophils Absolute: 0 10*3/uL (ref 0.0–0.1)
Basophils Relative: 0 %
Eosinophils Absolute: 0 10*3/uL (ref 0.0–0.5)
Eosinophils Relative: 1 %
HCT: 36.3 % (ref 36.0–46.0)
Hemoglobin: 12.3 g/dL (ref 12.0–15.0)
Immature Granulocytes: 1 %
Lymphocytes Relative: 20 %
Lymphs Abs: 1.2 10*3/uL (ref 0.7–4.0)
MCH: 33.9 pg (ref 26.0–34.0)
MCHC: 33.9 g/dL (ref 30.0–36.0)
MCV: 100 fL (ref 80.0–100.0)
Monocytes Absolute: 0.5 10*3/uL (ref 0.1–1.0)
Monocytes Relative: 9 %
Neutro Abs: 4.1 10*3/uL (ref 1.7–7.7)
Neutrophils Relative %: 69 %
Platelets: 111 10*3/uL — ABNORMAL LOW (ref 150–400)
RBC: 3.63 MIL/uL — ABNORMAL LOW (ref 3.87–5.11)
RDW: 12.2 % (ref 11.5–15.5)
WBC: 5.9 10*3/uL (ref 4.0–10.5)
nRBC: 0 % (ref 0.0–0.2)

## 2023-02-11 LAB — URINALYSIS, ROUTINE W REFLEX MICROSCOPIC
Bacteria, UA: NONE SEEN
Bilirubin Urine: NEGATIVE
Glucose, UA: NEGATIVE mg/dL
Ketones, ur: NEGATIVE mg/dL
Leukocytes,Ua: NEGATIVE
Nitrite: NEGATIVE
Protein, ur: NEGATIVE mg/dL
Specific Gravity, Urine: 1.006 (ref 1.005–1.030)
pH: 6 (ref 5.0–8.0)

## 2023-02-11 LAB — BRAIN NATRIURETIC PEPTIDE: B Natriuretic Peptide: 38.5 pg/mL (ref 0.0–100.0)

## 2023-02-11 LAB — MAGNESIUM: Magnesium: 1.8 mg/dL (ref 1.7–2.4)

## 2023-02-11 MED ORDER — POTASSIUM CHLORIDE CRYS ER 20 MEQ PO TBCR
40.0000 meq | EXTENDED_RELEASE_TABLET | Freq: Every day | ORAL | Status: DC
  Administered 2023-02-11: 40 meq via ORAL
  Filled 2023-02-11: qty 2

## 2023-02-11 MED ORDER — MAGNESIUM SULFATE IN D5W 1-5 GM/100ML-% IV SOLN
1.0000 g | Freq: Once | INTRAVENOUS | Status: AC
  Administered 2023-02-11: 1 g via INTRAVENOUS
  Filled 2023-02-11: qty 100

## 2023-02-11 MED ORDER — MELATONIN 3 MG PO TABS
3.0000 mg | ORAL_TABLET | Freq: Every evening | ORAL | Status: DC | PRN
  Administered 2023-02-11 – 2023-02-15 (×4): 3 mg via ORAL
  Filled 2023-02-11 (×4): qty 1

## 2023-02-11 NOTE — Procedures (Signed)
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 She presents with breakthrough seizures in the setting of medication noncompliance due to changes in insurance causing delay in receiving her Vimpat.  EEG to evaluate for seizure   Level of alertness: Awake, asleep   AEDs during EEG study: LCM, PHT, GBP   Technical aspects: This EEG study was done with scalp electrodes positioned according to the 10-20 International system of electrode placement. Electrical activity was reviewed with band pass filter of 1-70Hz , sensitivity of 7 uV/mm, display speed of 76mm/sec with a 60Hz  notched filter applied as appropriate. EEG data were recorded continuously and digitally stored.  Video monitoring was available and reviewed as appropriate.   Description: The posterior dominant rhythm consists of 8-9 Hz activity of moderate voltage (25-35 uV) seen predominantly in posterior head regions, symmetric and reactive to eye opening and eye closing. Sleep was characterized by sleep spindles (12-14hz ), maximal fronto-central region. EEG showed continuous polymorphic sharply contoured rhythmic 3 to 6 Hz theta-delta slowing in right hemisphere, maximal right posterior quadrant with waxing and waning morphology. Lateralized periodic discharges with overriding fast activity were noted in right hemisphere, maximal right posterior quadrant, at 1Hz . Hyperventilation and photic stimulation were not performed.      One seizures were noted on 02/10/2023 at 1913. No clinical signs were seen. EEG showed 3-5Hz  theta-delta slowing in right posterior quadrant which the involves into right frontotemporal region admixed with sharp waves at 3-4hz . EEG then showed  high amplitude sharply contoured 12-13Hz  beta activity and involved left hemisphere. Subsequently EEG showed 2-3Hz  delta slowing. Duration of seizures was 45 seconds.    ABNORMALITY - Seizure without clinical signs, right posterior quadrant - Lateralized periodic discharges with overriding fast activity, right hemisphere, maximal right posterior quadrant ( LPD+F) - Continuous rhythmic slow, right hemisphere, maximal right posterior quadrant   IMPRESSION: This study showed one seizure without clinical signs on 02/10/2023 at 1913  arising from  right posterior quadrant, lasting about 45 seconds. Additionally there was evidence of epileptogenicity and cortical dysfunction arising from right hemisphere, maximal right posterior quadrant.   Study has improved significantly compared to previous day.    [name] [name]

## 2023-02-11 NOTE — Evaluation (Signed)
Physical Therapy Evaluation Patient Details Name: [name] MRN: [id number] DOB: March 19, 1956 Today's Date: 02/11/2023  History of Present Illness  67 y.o. female presents to [facility] hospital on 02/09/2023 after multiple seizures at home. Pt with another seizure on morning of 7/9. PMH: cerebral hemorrhage 1989, asthma, COPD, DVT, GERD, seizures, CVA, tunnel vision, neuropathy  Clinical Impression  Pt presents to PT with deficits in gait, balance, cognition, endurance, awareness. PT session is limited by EEG lines and pt impulsivity. Pt with significant balance deviations and poor awareness of falls risk. Pt requires assistance to maintain stability in static standing, with a tendency to lean forward and lose balance. Pt is at a high risk for falls at this time. Pt reports independence with household mobility at baseline. PT recommends high intensity inpatient PT services at this time.        Assistance Recommended at Discharge Frequent or constant Supervision/Assistance  If plan is discharge home, recommend the following:  Can travel by private vehicle  A lot of help with walking and/or transfers;A lot of help with bathing/dressing/bathroom;Assistance with cooking/housework;Direct supervision/assist for medications management;Direct supervision/assist for financial management;Assist for transportation;Help with stairs or ramp for entrance        Equipment Recommendations BSC/3in1  Recommendations for Other Services  Rehab consult    Functional Status Assessment Patient has had a recent decline in their functional status and demonstrates the ability to make significant improvements in function in a reasonable and predictable amount of time.     Precautions / Restrictions Precautions Precautions: Fall Precaution Comments: EEG Restrictions Weight Bearing Restrictions: No      Mobility  Bed Mobility Overal bed mobility: Needs Assistance Bed Mobility: Supine to Sit, Sit to Supine      Supine to sit: Supervision, HOB elevated Sit to supine: Supervision        Transfers Overall transfer level: Needs assistance Equipment used: None Transfers: Sit to/from Stand Sit to Stand: Min assist           General transfer comment: tendency for anterior lean, guidance for balance when returning to sitting    Ambulation/Gait Ambulation/Gait assistance: Mod assist Gait Distance (Feet): 2 Feet Assistive device: IV Pole Gait Pattern/deviations: Step-to pattern Gait velocity: reduced Gait velocity interpretation: <1.31 ft/sec, indicative of household ambulator   General Gait Details: pt impulsively begins stepping forward with large anterior lean, requires PT support to prevent forward loss of balance and potential fall  Stairs            Wheelchair Mobility     Tilt Bed    Modified Rankin (Stroke Patients Only)       Balance Overall balance assessment: Needs assistance Sitting-balance support: No upper extremity supported, Feet supported Sitting balance-Leahy Scale: Fair     Standing balance support: Bilateral upper extremity supported, Single extremity supported Standing balance-Leahy Scale: Poor Standing balance comment: modA                             Pertinent Vitals/Pain Pain Assessment Pain Assessment: No/denies pain    Home Living Family/patient expects to be discharged to:: Private residence Living Arrangements: Children;Other relatives Available Help at Discharge: Family;Available 24 hours/day Type of Home: House Home Access: Stairs to enter Entrance Stairs-Rails: Right Entrance Stairs-Number of Steps: 10 Alternate Level Stairs-Number of Steps: 15 (note from January indicates the pt has level entry to home and a stair lift indoors, does not ascend stairs typically) Home Layout: Two  level Home Equipment: Agricultural consultant (2 wheels)      Prior Function Prior Level of Function : Needs assist             Mobility  Comments: independent with ambulation per patient, reports she only falls when having seizures ADLs Comments: assist for transportation     Hand Dominance   Dominant Hand: Right    Extremity/Trunk Assessment   Upper Extremity Assessment Upper Extremity Assessment: RUE deficits/detail;LUE deficits/detail RUE Coordination: decreased fine motor LUE Coordination: decreased fine motor    Lower Extremity Assessment Lower Extremity Assessment: Generalized weakness    Cervical / Trunk Assessment Cervical / Trunk Assessment: Kyphotic  Communication   Communication: Expressive difficulties  Cognition Arousal/Alertness: Awake/alert Behavior During Therapy: Impulsive Overall Cognitive Status: Impaired/Different from baseline Area of Impairment: Orientation, Memory, Safety/judgement, Awareness, Following commands, Problem solving                 Orientation Level: Disoriented to, Time   Memory: Decreased recall of precautions, Decreased short-term memory Following Commands: Follows one step commands consistently, Follows multi-step commands inconsistently Safety/Judgement: Decreased awareness of safety, Decreased awareness of deficits Awareness: Intellectual Problem Solving: Difficulty sequencing, Requires verbal cues          General Comments General comments (skin integrity, edema, etc.): VSS, pt on 2LNC upon PT arrival, pt desats to 90% with activity when weaned to room air    Exercises     Assessment/Plan    PT Assessment Patient needs continued PT services  PT Problem List Decreased strength;Decreased activity tolerance;Decreased balance;Decreased mobility;Decreased knowledge of use of DME;Decreased safety awareness;Decreased knowledge of precautions       PT Treatment Interventions DME instruction;Gait training;Functional mobility training;Therapeutic activities;Therapeutic exercise;Balance training;Neuromuscular re-education;Stair training;Patient/family  education    PT Goals (Current goals can be found in the Care Plan section)  Acute Rehab PT Goals Patient Stated Goal: to return to prior level of independence PT Goal Formulation: With patient Time For Goal Achievement: 02/25/23 Potential to Achieve Goals: Good    Frequency Min 4X/week     Co-evaluation               AM-PAC PT "6 Clicks" Mobility  Outcome Measure Help needed turning from your back to your side while in a flat bed without using bedrails?: A Little Help needed moving from lying on your back to sitting on the side of a flat bed without using bedrails?: A Little Help needed moving to and from a bed to a chair (including a wheelchair)?: A Lot Help needed standing up from a chair using your arms (e.g., wheelchair or bedside chair)?: A Little Help needed to walk in hospital room?: Total Help needed climbing 3-5 steps with a railing? : Total 6 Click Score: 13    End of Session   Activity Tolerance: Other (comment) (limited by EEG lines) Patient left: in bed;with call bell/phone within reach;with bed alarm set Nurse Communication: Mobility status PT Visit Diagnosis: Other abnormalities of gait and mobility (R26.89);Muscle weakness (generalized) (M62.81);Difficulty in walking, not elsewhere classified (R26.2)    Time: 1610-9604 PT Time Calculation (min) (ACUTE ONLY): 23 min   Charges:   PT Evaluation $PT Eval Low Complexity: 1 Low   PT General Charges $$ ACUTE PT VISIT: 1 Visit         Arlyss Gandy, PT, DPT Acute Rehabilitation Office [phone]   Arlyss Gandy 02/11/2023, 12:13 PM

## 2023-02-11 NOTE — Plan of Care (Signed)

## 2023-02-11 NOTE — Progress Notes (Signed)
Subjective: Patient reports that she is doing well and is able to recognize her family members.  She is alert and oriented to person place time and situation, she is easily distractible and often forgets topics during conversation.  Her family states that at baseline she is alert and oriented x 4 and able to converse and care for herself normally.  Suspect that she has some residual confusion, given the amount of subclinical seizure activity she has had during this admission.  Objective: Current vital signs: BP 107/71   Pulse 91   Temp 99.1 F (37.3 C) (Oral)   Resp 16   Wt 56.4 kg   SpO2 98%   BMI 19.47 kg/m  Vital signs in last 24 hours: Temp:  [98.6 F (37 C)-99.4 F (37.4 C)] 99.1 F (37.3 C) (07/09 2215) Pulse Rate:  [82-112] 91 (07/10 0800) Resp:  [15-24] 16 (07/10 0800) BP: (83-125)/(61-94) 107/71 (07/10 0800) SpO2:  [65 %-100 %] 98 % (07/10 0800)  Intake/Output from previous day: 07/09 0701 - 07/10 0700 In: 60 [P.O.:60] Out: 1270 [Urine:1270] Intake/Output this shift: No intake/output data recorded. Nutritional status:  Diet Order             Diet regular Fluid consistency: Thin  Diet effective now                   Neurologic Exam:  NEURO:  Mental Status: AA&Ox2-3 year correct but month wrong, able to state she is in the hospital because of seizures, 3 out of 3 registration, 0 out of 3 recall.  Able to name current president and last president but unable to recall any more than that.  Able to name 4 animals with 4 feet when asked. Speech/Language:  Cranial Nerves:  II: PERRL.  III, IV, VI: EOMI. Eyelids elevate symmetrically.  V: Sensation is intact to light touch and symmetrical to face.  VII: Smile is symmetrical.  VIII: hearing intact to voice. IX, X: Phonation is normal.  UJ:[id number] shrug 5/5. XII: tongue is midline without fasciculations. Motor: 5/5 strength to all muscle groups tested.  Tone is normal and bulk is normal Sensation- Intact to  light touch bilaterally.  Coordination: FTN intact bilaterally. No drift.  Gait- Deferred    Lab Results: Results for orders placed or performed during the hospital encounter of 02/09/23 (from the past 48 hour(s))  Phenytoin level, total     Status: Abnormal   Collection Time: 02/09/23 11:28 AM  Result Value Ref Range   Phenytoin Lvl 5.6 (L) 10.0 - 20.0 ug/mL    Comment: Performed at [facility], [address], [place], [place] [address]  Comprehensive metabolic panel     Status: Abnormal   Collection Time: 02/09/23 11:28 AM  Result Value Ref Range   Sodium 141 135 - 145 mmol/L   Potassium 3.4 (L) 3.5 - 5.1 mmol/L   Chloride 104 98 - 111 mmol/L   CO2 22 22 - 32 mmol/L   Glucose, Bld 110 (H) 70 - 99 mg/dL    Comment: Glucose reference range applies only to samples taken after fasting for at least 8 hours.   BUN 15 8 - 23 mg/dL   Creatinine, Ser 1.30 (H) 0.44 - 1.00 mg/dL   Calcium 9.0 8.9 - 86.5 mg/dL   Total Protein 6.4 (L) 6.5 - 8.1 g/dL   Albumin 3.8 3.5 - 5.0 g/dL   AST 23 15 - 41 U/L   ALT 15 0 - 44 U/L  Alkaline Phosphatase 51 38 - 126 U/L   Total Bilirubin 0.7 0.3 - 1.2 mg/dL   GFR, Estimated 57 (L) >60 mL/min    Comment: (NOTE) Calculated using the CKD-EPI Creatinine Equation (2021)    Anion gap 15 5 - 15    Comment: Performed at [facility], [address] [place], [place] [address]  CBC     Status: Abnormal   Collection Time: 02/09/23 11:28 AM  Result Value Ref Range   WBC 5.4 4.0 - 10.5 K/uL   RBC 3.77 (L) 3.87 - 5.11 MIL/uL   Hemoglobin 12.9 12.0 - 15.0 g/dL   HCT 14.7 82.9 - 56.2 %   MCV 103.4 (H) 80.0 - 100.0 fL   MCH 34.2 (H) 26.0 - 34.0 pg   MCHC 33.1 30.0 - 36.0 g/dL   RDW 13.0 86.5 - 78.4 %   Platelets 118 (L) 150 - 400 K/uL    Comment: CONSISTENT WITH PREVIOUS RESULT REPEATED TO VERIFY    nRBC 0.0 0.0 - 0.2 %    Comment: Performed at [facility], [address] [place], [place] [address]  Vitamin B12     Status: None    Collection Time: 02/09/23 10:42 PM  Result Value Ref Range   Vitamin B-12 206 180 - 914 pg/mL    Comment: (NOTE) This assay is not validated for testing neonatal or myeloproliferative syndrome specimens for Vitamin B12 levels. Performed at [facility], [address], [place], [place] [address]   Comprehensive metabolic panel     Status: Abnormal   Collection Time: 02/10/23  3:18 AM  Result Value Ref Range   Sodium 140 135 - 145 mmol/L   Potassium 3.0 (L) 3.5 - 5.1 mmol/L   Chloride 104 98 - 111 mmol/L   CO2 23 22 - 32 mmol/L   Glucose, Bld 85 70 - 99 mg/dL    Comment: Glucose reference range applies only to samples taken after fasting for at least 8 hours.   BUN 12 8 - 23 mg/dL   Creatinine, Ser 3.24 0.44 - 1.00 mg/dL   Calcium 8.5 (L) 8.9 - 10.3 mg/dL   Total Protein 6.1 (L) 6.5 - 8.1 g/dL   Albumin 3.4 (L) 3.5 - 5.0 g/dL   AST 17 15 - 41 U/L   ALT 12 0 - 44 U/L   Alkaline Phosphatase 47 38 - 126 U/L   Total Bilirubin 1.0 0.3 - 1.2 mg/dL   GFR, Estimated >40 >10 mL/min    Comment: (NOTE) Calculated using the CKD-EPI Creatinine Equation (2021)    Anion gap 13 5 - 15    Comment: Performed at [facility], [address], [place], [place] [address]  CBC     Status: Abnormal   Collection Time: 02/10/23  3:18 AM  Result Value Ref Range   WBC 5.4 4.0 - 10.5 K/uL   RBC 3.59 (L) 3.87 - 5.11 MIL/uL   Hemoglobin 12.0 12.0 - 15.0 g/dL   HCT 66.4 40.3 - 47.4 %   MCV 102.8 (H) 80.0 - 100.0 fL   MCH 33.4 26.0 - 34.0 pg   MCHC 32.5 30.0 - 36.0 g/dL   RDW 25.9 56.3 - 87.5 %   Platelets 108 (L) 150 - 400 K/uL    Comment: REPEATED TO VERIFY   nRBC 0.0 0.0 - 0.2 %    Comment: Performed at [facility], [address] [place], [place] [address]  Ammonia  Status: None   Collection Time: 02/10/23  9:56 AM  Result Value Ref Range   Ammonia 33 9 - 35 umol/L    Comment: Performed at [facility], [address], [place], [place] [address]  Urinalysis,  Routine w reflex microscopic -Urine, Clean Catch     Status: Abnormal   Collection Time: 02/10/23  4:55 PM  Result Value Ref Range   Color, Urine YELLOW YELLOW   APPearance CLEAR CLEAR   Specific Gravity, Urine 1.025 1.005 - 1.030   pH 5.0 5.0 - 8.0   Glucose, UA NEGATIVE NEGATIVE mg/dL   Hgb urine dipstick SMALL (A) NEGATIVE   Bilirubin Urine NEGATIVE NEGATIVE   Ketones, ur 80 (A) NEGATIVE mg/dL   Protein, ur 30 (A) NEGATIVE mg/dL   Nitrite NEGATIVE NEGATIVE   Leukocytes,Ua NEGATIVE NEGATIVE   RBC / HPF 0-5 0 - 5 RBC/hpf   WBC, UA 0-5 0 - 5 WBC/hpf   Bacteria, UA RARE (A) NONE SEEN   Squamous Epithelial / HPF 0-5 0 - 5 /HPF   Mucus PRESENT     Comment: Performed at [facility], [address], [place], [place] [address]  CBC with Differential/Platelet     Status: Abnormal   Collection Time: 02/11/23  2:02 AM  Result Value Ref Range   WBC 5.9 4.0 - 10.5 K/uL   RBC 3.63 (L) 3.87 - 5.11 MIL/uL   Hemoglobin 12.3 12.0 - 15.0 g/dL   HCT 82.9 56.2 - 13.0 %   MCV 100.0 80.0 - 100.0 fL   MCH 33.9 26.0 - 34.0 pg   MCHC 33.9 30.0 - 36.0 g/dL   RDW 86.5 78.4 - 69.6 %   Platelets 111 (L) 150 - 400 K/uL    Comment: REPEATED TO VERIFY   nRBC 0.0 0.0 - 0.2 %   Neutrophils Relative % 69 %   Neutro Abs 4.1 1.7 - 7.7 K/uL   Lymphocytes Relative 20 %   Lymphs Abs 1.2 0.7 - 4.0 K/uL   Monocytes Relative 9 %   Monocytes Absolute 0.5 0.1 - 1.0 K/uL   Eosinophils Relative 1 %   Eosinophils Absolute 0.0 0.0 - 0.5 K/uL   Basophils Relative 0 %   Basophils Absolute 0.0 0.0 - 0.1 K/uL   Immature Granulocytes 1 %   Abs Immature Granulocytes 0.03 0.00 - 0.07 K/uL    Comment: Performed at [facility], [address] [place], [place] [address]  Brain natriuretic peptide     Status: None   Collection Time: 02/11/23  2:02 AM  Result Value Ref Range   B Natriuretic Peptide 38.5 0.0 - 100.0 pg/mL    Comment: Performed at [facility], [address] [place], [place] [address]   Magnesium     Status: None   Collection Time: 02/11/23  2:02 AM  Result Value Ref Range   Magnesium 1.8 1.7 - 2.4 mg/dL    Comment: Performed at [facility], [address] [place], [place] [address]  Comprehensive metabolic panel     Status: Abnormal   Collection Time: 02/11/23  2:02 AM  Result Value Ref Range   Sodium 137 135 - 145 mmol/L   Potassium 3.0 (L) 3.5 - 5.1 mmol/L   Chloride 104 98 - 111 mmol/L   CO2 25 22 - 32 mmol/L   Glucose, Bld 137 (H) 70 - 99 mg/dL    Comment: Glucose reference range applies only to samples taken after  fasting for at least 8 hours.   BUN 6 (L) 8 - 23 mg/dL   Creatinine, Ser 2.13 0.44 - 1.00 mg/dL   Calcium 8.5 (L) 8.9 - 10.3 mg/dL   Total Protein 5.9 (L) 6.5 - 8.1 g/dL   Albumin 3.4 (L) 3.5 - 5.0 g/dL   AST 16 15 - 41 U/L   ALT 14 0 - 44 U/L   Alkaline Phosphatase 45 38 - 126 U/L   Total Bilirubin 0.5 0.3 - 1.2 mg/dL   GFR, Estimated >08 >65 mL/min    Comment: (NOTE) Calculated using the CKD-EPI Creatinine Equation (2021)    Anion gap 8 5 - 15    Comment: Performed at [facility], [address] [place], [place] [address]    No results found for this or any previous visit (from the past 240 hour(s)).  Lipid Panel No results for input(s): "CHOL", "TRIG", "HDL", "CHOLHDL", "VLDL", "LDLCALC" in the last 72 hours.  Studies/Results: [place] EKG SITE RITE  Result Date: 02/10/2023 If Site Rite image not attached, placement could not be confirmed due to current cardiac rhythm.  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 She presents with breakthrough seizures  in the setting of medication noncompliance due to changes in insurance causing delay in receiving her Vimpat.  EEG to evaluate for seizure  Level of alertness: Awake, asleep  AEDs during EEG study: LCM, PHT, GBP, Ativan  Technical aspects: This EEG study was done with scalp electrodes positioned according to the 10-20 International system of electrode placement. Electrical activity was reviewed with band pass filter of 1-70Hz , sensitivity of 7 uV/mm, display speed of 50mm/sec with a 60Hz  notched filter applied as appropriate. EEG data were recorded continuously and digitally stored.  Video monitoring was available and reviewed as appropriate.  Description: The posterior dominant rhythm consists of 8-9 Hz activity of moderate voltage (25-35 uV) seen predominantly in posterior head regions, symmetric and reactive to eye opening and eye closing. Sleep was characterized by sleep spindles (12-14hz ), maximal fronto-central region. EEG showed continuous polymorphic sharply contoured rhythmic 3 to 6 Hz theta-delta slowing in right hemisphere, maximal right posterior quadrant with waxing and waning morphology. Lateralized periodic discharges with overriding fast activity were noted in right hemisphere, maximal right posterior quadrant, at 1Hz . Independent left anterior temporal spikes were also noted. Hyperventilation and photic stimulation were not performed.   56 seizures were noted. No clinical signs were seen. EEG showed 3-5Hz  theta-delta slowing in right posterior quadrant which the involves into right frontotemporal region admixed with sharp waves at 3-4hz . EEG then showed high amplitude sharply contoured 12-13Hz  beta activity and involved left hemisphere. Subsequently EEG showed 2-3Hz  delta slowing. Average duration of seizures was 45 seconds to 1 minute. Last seizure was noted on 02/10/2023 at 1313.  ABNORMALITY - Seizure without clinical signs, right posterior quadrant - Lateralized periodic discharges with overriding  fast activity, right hemisphere, maximal right posterior quadrant ( LPD+F) - Spike, left anterior temporal - Continuous rhythmic slow, right hemisphere, maximal right posterior quadrant  IMPRESSION: This study showed 56 seizures without clinical signs arising from  right posterior quadrant, lasting about 45 seconds  to 1 minute. Last seizure was noted on 02/10/2023 at 1313.  Additionally there was evidence of epileptogenicity and cortical dysfunction arising from right hemisphere, maximal right posterior quadrant. There was also independent epileptogenicity arising from left anterior temporal region. Dr. Otelia Limes was notified.  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 She presents with breakthrough seizures in the setting of medication noncompliance due to changes in insurance causing delay in receiving her Vimpat.  EEG to evaluate for seizure Level of alertness: Awake AEDs during EEG study: LCM, PHT, Ativan Technical aspects: This EEG study was done with scalp electrodes positioned according to the 10-20 International system of electrode placement. Electrical activity was reviewed with band pass filter of 1-70Hz , sensitivity of 7 uV/mm, display speed of 77mm/sec with a 60Hz  notched filter applied as appropriate. EEG data were recorded continuously and digitally stored.  Video monitoring was available and reviewed as appropriate. Description: The posterior dominant rhythm consists of 8-9 Hz activity of moderate voltage (25-35 uV) seen predominantly in posterior head regions, symmetric and reactive to eye opening and eye closing. EEG showed  continuous polymorphic sharply contoured rhythmic 3 to 6 Hz theta-delta slowing in right hemisphere, maximal right posterior quadrant with waxing and waning morphology. Abundant spike and waves were noted in right hemisphere, maximal right posterior quadrant, at times qasi- periodic at 1-2Hz . Frequently, the right hemispheric spikes also involved the left  frontal region. Hyperventilation and photic stimulation were not performed.   ABNORMALITY - Spike and waves, right hemisphere, maximal right posterior quadrant - Continuous rhythmic slow, right hemisphere, maximal right posterior quadrant IMPRESSION: This study showed evidence of epileptogenicity and cortical dysfunction arising from right hemisphere, maximal right posterior quadrant. This EEG pattern is on the ictal-interictal continuum with potential for seizures. No definite seizures were seen throughout the recording. Dr. Otelia Limes was notified. Charlsie Quest   DG Chest Portable 1 View  Result Date: 02/09/2023 CLINICAL DATA:  Cough. EXAM: PORTABLE CHEST 1 VIEW COMPARISON:  October 07, 2022. FINDINGS: The heart size and mediastinal contours are within normal limits. Both lungs are clear. The visualized skeletal structures are unremarkable. IMPRESSION: No active disease. Electronically Signed   By: Lupita Raider M.D.   On: 02/09/2023 13:37   CT HEAD WO CONTRAST ( )  Result Date: 02/09/2023 CLINICAL DATA:  Provided history: Seizure disorder, clinical change. EXAM: CT HEAD WITHOUT CONTRAST TECHNIQUE: Contiguous axial images were obtained from the base of the skull through the vertex without intravenous contrast. RADIATION DOSE REDUCTION: This exam was performed according to the departmental dose-optimization program which includes automated exposure control, adjustment of the mA and/or kV according to patient size and/or use of iterative reconstruction technique. COMPARISON:  Prior head CT examinations 11/04/2022 and earlier. 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 There is no acute intracranial hemorrhage. No acute demarcated cortical infarct. No extra-axial fluid collection. No evidence of an intracranial mass. No midline shift. Vascular: No hyperdense vessel.  Atherosclerotic calcifications. Skull: Right parietooccipital cranioplasty. No acute calvarial fracture or aggressive osseous lesion. Sinuses/Orbits: No orbital mass or acute orbital finding. Small to moderate-sized  fluid level, and mild background mucosal thickening, within the right maxillary sinus. Other: Small-volume fluid within the left mastoid air cells. IMPRESSION: 1.  No evidence of an acute intracranial abnormality. 2. 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 Correlate with the surgical history. 3. Right maxillary sinusitis. 4. Small-volume fluid within the left mastoid air cells. Electronically Signed   By: Jackey Loge D.O.   On: 02/09/2023 12:55    Medications: Scheduled:  Chlorhexidine Gluconate Cloth  6 each Topical Daily   vitamin B-12  2,000 mcg Oral Daily   enoxaparin (LOVENOX) injection  40 mg Subcutaneous Q24H   gabapentin  100 mg Oral TID   lacosamide  200 mg Oral BID   oxybutynin  5 mg Oral QHS   phenytoin  100 mg Oral QPM   phenytoin  130 mg Oral Daily   potassium chloride  40 mEq Oral Daily   sodium chloride flush  10-40 mL Intracatheter Q12H   Continuous:  levETIRAcetam 500 mg (02/11/23 0034)   CT Head- No acute changes, encephalomalacia right parietoccipital area    EEG 02/09/2023:  ABNORMALITY - Spike and waves, right hemisphere, maximal right posterior quadrant - Continuous rhythmic slow, right hemisphere, maximal right posterior quadrant IMPRESSION: This study showed evidence of epileptogenicity and cortical dysfunction arising from  right hemisphere, maximal right posterior quadrant. This EEG pattern is on the ictal-interictal continuum with potential for seizures. No definite seizures were seen throughout the recording.  LTM EEG 7/9:  ABNORMALITY - Seizure without clinical signs, right posterior quadrant - Lateralized periodic discharges with overriding fast activity, right hemisphere, maximal right posterior quadrant ( LPD+F) - Spike, left anterior temporal - Continuous rhythmic slow, right hemisphere, maximal right posterior quadrant  IMPRESSION: This study showed 56 seizures without clinical signs arising from  right posterior quadrant, lasting about 45 seconds to 1 minute. Last seizure was noted on 02/10/2023 at 1313.  Additionally there was evidence of epileptogenicity and cortical dysfunction arising from right hemisphere, maximal right posterior quadrant. There was also independent epileptogenicity arising from left anterior temporal region.   LTM EEG 7/10:  ABNORMALITY - Seizure without clinical signs, right posterior quadrant - Lateralized periodic discharges with overriding fast activity, right hemisphere, maximal right posterior quadrant ( LPD+F) - Continuous rhythmic slow, right hemisphere, maximal right posterior quadrant IMPRESSION: This study showed one seizure without clinical signs on 02/10/2023 at 1913  arising from  right posterior quadrant, lasting about 45 seconds. Additionally there was evidence of epileptogenicity and cortical dysfunction arising from right hemisphere, maximal right posterior quadrant. Study has improved significantly compared to previous day.    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 She presents with breakthrough seizures in the setting of medication noncompliance due to changes in insurance causing delay in receiving her Vimpat.   - Exam today: Nonfocal but shows some  impairment in memory, concentration and cognitive abilities.  Suspect that this is simply residual postictal confusion and will resolve with time as EEG improves.  If mental status does not improve with resolution of seizures and improvement of EEG, will pursue additional workup, including MRI. - CT head without acute changes. No recent trauma. 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 The seizure activity was not accompanied by clinical signs  on video. Has continued lateralized periodic discharges with overriding fast activity, right hemisphere, maximal right posterior quadrant (LPD+F) - Ammonia level normal at 33 - Overall impression: The lapse in AED coverage at home is the most likely etiology for her breakthrough seizures. She is clinically improving with correlating improvement on EEG as well   Recommendations: - Continue home dosing regimen of Vimpat 200mg  bid + dilantin 130mg  in AM and 100mg  in PM (see Dr. Rosalyn Gess note from May 2024, dilantin toxicity has been an issue so regardless of seizure recurrence, DO NOT recommend increasing dilantin even in presence of subtherapeutic levels)  - Neurontin 100 mg TID was added this admission - Although Keppra is listed in HPI as previously being ineffective, with side effect apparently of seizures, will have initiated another trial of this medication given frequent electrographic seizures on LTM EEG. Continue Keppra at 500 mg IV BID. Risks/benefits discussed with the patient on Tuesday with informed consent obtained at that time.  - Continue LTM EEG - Avoid tramadol, as this decreases seizure threshold  - Of note, rectal versus nasal route benzo would be helpful, as patient's daughter only has PO lorazepam for breakthrough seizures -Continue B12 at 2 mg po every day.      LOS: 1 day   @Electronically  signed: Dr. Caryl Pina 02/11/2023  8:55 AM

## 2023-02-11 NOTE — Progress Notes (Addendum)
PROGRESS NOTE                                                                                                                                                                                                             Patient Demographics:    [name], is a 67 y.o. female, DOB - 1955/09/08, ZOX:[id number]  Outpatient Primary MD for the patient is [name], FNP    LOS - 1  Admit date - 02/09/2023    Chief Complaint  Patient presents with   Seizures       Brief Narrative (HPI from H&P)     67 y.o. female with medical history significant of thrombocytopenia, paroxysmal A-fib, hypertension, hyperlipidemia, GERD, DVT, chronic pain, COPD, anxiety, hemorrhagic stroke, stress incontinence, seizure disorder, neuropathy, breast cancer presenting after multiple seizures at home.  Orting to the family members patient was compliant with her Dilantin dose and was taking it on a daily basis however she did run out of Vimpat few days ago due to insurance problems, in the ER Dilantin levels were low, she was seen by neurology and admitted for ongoing seizures.   Subjective:   Patient in bed, appears comfortable, denies any headache, no fever, no chest pain or pressure, no shortness of breath , no abdominal pain. No new focal weakness.   Assessment  & Plan :    Breakthrough seizures.  Ran out of Vimpat but was taking Dilantin as prescribed, Dilantin levels were still low, seen by neurology, head CT nonacute, EEG does not show any acute seizures but does show evidence of epileptogenicity and cortical dysfunction, had another seizure morning of 02/10/2023, currently on home dose of Dilantin, cannot increase the dose due to history of side effects with high-dose, continue Vimpat, seizures seems to have improved, defer further management to neurology clinically much better.,   Mild chronic thrombocytopenia.  Stable.  Stress incontinence.   On oxybutynin  Chronic pain.  On tramadol.  Neuropathy.  Lyrica switched to Neurontin, will be discharged on Neurontin.  Hypokalemia.  Replace      Condition - Extremely Guarded  Family Communication  :  daughter [phone]  02/10/23  Code Status :  Full  Consults  :  Neuro  PUD Prophylaxis :     Procedures  :     EEG - This study showed  one seizure without clinical signs on 02/10/2023 at 1913  arising from  right posterior quadrant, lasting about 45 seconds. Additionally there was evidence of epileptogenicity and cortical dysfunction arising from right hemisphere, maximal right posterior quadrant.    Study has improved significantly compared to previous day.   EEG - This study showed evidence of epileptogenicity and cortical dysfunction arising from right hemisphere, maximal right posterior quadrant. This EEG pattern is on the ictal-interictal continuum with potential for seizures.  CT - 1.  No evidence of an acute intracranial abnormality. 2. 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 Correlate with the surgical history. 3. Right maxillary sinusitis. 4. Small-volume fluid within the left mastoid air cells.      Disposition Plan  :    Status is: Observation  DVT Prophylaxis  :    enoxaparin (LOVENOX) injection 40 mg Start: 02/09/23 1800    Lab Results  Component Value Date   PLT 111 (L) 02/11/2023    Diet :  Diet Order             Diet regular Fluid consistency: Thin  Diet effective now                    Inpatient Medications  Scheduled Meds:  Chlorhexidine Gluconate Cloth  6 each Topical Daily   vitamin B-12  2,000 mcg Oral Daily   enoxaparin (LOVENOX) injection  40 mg Subcutaneous Q24H   gabapentin  100 mg Oral TID   lacosamide  200 mg Oral BID   oxybutynin  5 mg Oral QHS   phenytoin  100 mg Oral QPM   phenytoin  130 mg Oral Daily   potassium chloride   40 mEq Oral Daily   sodium chloride flush  10-40 mL Intracatheter Q12H   Continuous Infusions:  levETIRAcetam 500 mg (02/11/23 0034)   PRN Meds:.acetaminophen **OR** acetaminophen, LORazepam, polyethylene glycol  Antibiotics  :    Anti-infectives (From admission, onward)    None         Objective:   Vitals:   02/11/23 0000 02/11/23 0200 02/11/23 0400 02/11/23 0800  BP: 112/68  (!) 83/72 107/71  Pulse: 98 87 82 91  Resp: (!) 23 19 17 16   Temp:      TempSrc:      SpO2: 98% 97% 94% 98%  Weight:        Wt Readings from Last 3 Encounters:  02/09/23 56.4 kg  01/12/23 58.4 kg  12/31/22 59.4 kg     Intake/Output Summary (Last 24 hours) at 02/11/2023 0928 Last data filed at 02/11/2023 0600 Gross per 24 hour  Intake 60 ml  Output 1270 ml  Net -1210 ml     Physical Exam  Awake Alert, no focal deficits [place].AT,PERRAL Supple Neck, No JVD,   Symmetrical Chest wall movement, Good air movement bilaterally, CTAB RRR,No Gallops,Rubs or new Murmurs,  +ve B.Sounds, Abd Soft, No tenderness,   No Cyanosis, Clubbing or edema       Data Review:    Recent Labs  Lab 02/09/23 1128 02/10/23 0318 02/11/23 0202  WBC 5.4 5.4 5.9  HGB 12.9 12.0 12.3  HCT 39.0 36.9 36.3  PLT 118* 108* 111*  MCV 103.4* 102.8* 100.0  MCH 34.2* 33.4 33.9  MCHC 33.1 32.5 33.9  RDW 12.4 12.2 12.2  LYMPHSABS  --   --  1.2  MONOABS  --   --  0.5  EOSABS  --   --  0.0  BASOSABS  --   --  0.0    Recent Labs  Lab 02/09/23 1128 02/10/23 0318 02/10/23 0956 02/11/23 0202  NA 141 140  --  137  K 3.4* 3.0*  --  3.0*  CL 104 104  --  104  CO2 22 23  --  25  ANIONGAP 15 13  --  8  GLUCOSE 110* 85  --  137*  BUN 15 12  --  6*  CREATININE 1.08* 0.78  --  0.69  AST 23 17  --  16  ALT 15 12  --  14  ALKPHOS 51 47  --  45  BILITOT 0.7 1.0  --  0.5  ALBUMIN 3.8 3.4*  --  3.4*  AMMONIA  --   --  33  --   BNP  --   --   --  38.5  MG  --   --   --  1.8  CALCIUM 9.0 8.5*  --  8.5*    Micro  Results No results found for this or any previous visit (from the past 240 hour(s)).  Radiology Reports [place] EKG SITE RITE  Result Date: 02/10/2023 If Site Rite image not attached, placement could not be confirmed due to current cardiac rhythm.  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 She presents with breakthrough seizures in the setting of medication noncompliance due to changes in insurance causing delay in receiving her Vimpat.  EEG to evaluate for seizure  Level of alertness: Awake, asleep  AEDs during EEG study: LCM, PHT, GBP, Ativan  Technical aspects: This EEG study was done with scalp electrodes positioned according to the 10-20 International system of electrode placement. Electrical activity was reviewed with band pass filter of 1-70Hz , sensitivity of 7 uV/mm, display speed of 67mm/sec with a 60Hz  notched filter applied as appropriate. EEG data were recorded continuously and digitally stored.  Video monitoring was available and reviewed as appropriate.  Description: The posterior dominant rhythm consists of 8-9 Hz activity of moderate voltage (25-35 uV) seen predominantly in posterior head regions, symmetric and reactive to eye opening and eye closing. Sleep was characterized by sleep spindles (12-14hz ), maximal fronto-central region. EEG showed continuous polymorphic sharply contoured rhythmic 3 to 6 Hz theta-delta slowing in right hemisphere, maximal right posterior quadrant with waxing and waning morphology. Lateralized periodic discharges with overriding fast activity were noted in right hemisphere, maximal right  posterior quadrant, at 1Hz . Independent left anterior temporal spikes were also noted. Hyperventilation and photic stimulation were not performed.   56 seizures were noted. No clinical signs were seen. EEG showed 3-5Hz  theta-delta slowing in right posterior quadrant which the involves into right frontotemporal region admixed with sharp waves at 3-4hz . EEG then showed high amplitude sharply contoured 12-13Hz  beta activity and involved left hemisphere. Subsequently EEG showed 2-3Hz  delta slowing. Average duration of seizures was 45 seconds to 1 minute. Last seizure was noted on 02/10/2023 at 1313.  ABNORMALITY - Seizure without clinical signs, right posterior quadrant - Lateralized periodic discharges with overriding fast activity, right hemisphere, maximal right posterior quadrant ( LPD+F) - Spike, left anterior temporal - Continuous rhythmic  slow, right hemisphere, maximal right posterior quadrant  IMPRESSION: This study showed 56 seizures without clinical signs arising from  right posterior quadrant, lasting about 45 seconds to 1 minute. Last seizure was noted on 02/10/2023 at 1313.  Additionally there was evidence of epileptogenicity and cortical dysfunction arising from right hemisphere, maximal right posterior quadrant. There was also independent epileptogenicity arising from left anterior temporal region. Dr. Otelia Limes was notified.  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 She presents with breakthrough seizures in the setting of medication noncompliance due to changes in insurance causing delay in receiving her Vimpat.  EEG to evaluate  for seizure Level of alertness: Awake AEDs during EEG study: LCM, PHT, Ativan Technical aspects: This EEG study was done with scalp electrodes positioned according to the 10-20 International system of electrode placement. Electrical activity was reviewed with band pass filter of 1-70Hz , sensitivity of 7 uV/mm, display speed of 21mm/sec with a 60Hz  notched filter applied as appropriate. EEG data were recorded continuously and digitally stored.  Video monitoring was available and reviewed as appropriate. Description: The posterior dominant rhythm consists of 8-9 Hz activity of moderate voltage (25-35 uV) seen predominantly in posterior head regions, symmetric and reactive to eye opening and eye closing. EEG showed continuous polymorphic sharply contoured rhythmic 3 to 6 Hz theta-delta slowing in right hemisphere, maximal right posterior quadrant with waxing and waning morphology. Abundant spike and waves were noted in right hemisphere, maximal right posterior quadrant, at times qasi- periodic at 1-2Hz . Frequently, the right hemispheric spikes also involved the left  frontal region. Hyperventilation and photic stimulation were not performed.   ABNORMALITY - Spike and waves, right hemisphere, maximal right posterior quadrant - Continuous rhythmic slow, right hemisphere, maximal right posterior quadrant IMPRESSION: This study showed evidence of epileptogenicity and cortical dysfunction arising from right hemisphere, maximal right posterior quadrant. This EEG pattern is on the ictal-interictal continuum with potential for seizures. No definite seizures were seen throughout the recording. Dr. Otelia Limes was notified. Charlsie Quest   DG Chest Portable 1 View  Result Date: 02/09/2023 CLINICAL DATA:  Cough. EXAM: PORTABLE CHEST 1 VIEW COMPARISON:  October 07, 2022. FINDINGS: The heart size and mediastinal contours are within normal limits. Both lungs are clear. The visualized skeletal structures are unremarkable. IMPRESSION:  No active disease. Electronically Signed   By: Lupita Raider M.D.   On: 02/09/2023 13:37   CT HEAD WO CONTRAST ( )  Result Date: 02/09/2023 CLINICAL DATA:  Provided history: Seizure disorder, clinical change. EXAM: CT HEAD WITHOUT CONTRAST TECHNIQUE: Contiguous axial images were obtained from the base of the skull through the vertex without intravenous contrast. RADIATION DOSE REDUCTION: This exam was performed according to the departmental dose-optimization program which includes automated exposure control, adjustment of the mA and/or kV according to patient size and/or use of iterative reconstruction technique. COMPARISON:  Prior head CT examinations 11/04/2022 and earlier. FINDINGS: Brain: Redemonstrated large focus of chronic encephalomalacia/gliosis within the posterior right cerebral hemisphere (underlying a cranioplasty). There is no acute intracranial hemorrhage. No acute demarcated cortical infarct. No extra-axial fluid collection. No evidence of an intracranial mass. No midline shift. Vascular: No hyperdense  vessel.  Atherosclerotic calcifications. Skull: Right parietooccipital cranioplasty. No acute calvarial fracture or aggressive osseous lesion. Sinuses/Orbits: No orbital mass or acute orbital finding. Small to moderate-sized fluid level, and mild background mucosal thickening, within the right maxillary sinus. Other: Small-volume fluid within the left mastoid air cells. IMPRESSION: 1.  No evidence of an acute intracranial abnormality. 2. 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 Correlate with the surgical history. 3. Right maxillary sinusitis. 4. Small-volume fluid within the left mastoid air cells. Electronically Signed   By: Jackey Loge D.O.   On: 02/09/2023 12:55      Signature  -   Susa Raring M.D on 02/11/2023 at 9:28 AM   -  To page go to [email or web address]

## 2023-02-11 NOTE — Progress Notes (Signed)
? ?  Inpatient Rehab Admissions Coordinator : ? ?Per therapy recommendations, patient was screened for CIR candidacy by Breia Ocampo RN MSN.  At this time patient appears to be a potential candidate for CIR. I will place a rehab consult per protocol for full assessment. Please call me with any questions. ? ?Lucius Wise RN MSN ?Admissions Coordinator ?[phone] ?  ?

## 2023-02-11 NOTE — Progress Notes (Signed)
[facility] night cross cover note:   I was notified by RN of that the patient has been experiencing difficulty sleeping, with associated request for a sleep aid. I subsequently placed order for prn melatonin for insomnia.   Update: Melatonin ineffective, with RN conveying that she does not believe that the patient slept at all overnight.  Patient remains confused, forgetful, but with this mental status unchanged throughout the night.  RN conveys that there has been no additional evidence to suggest ongoing hallucinations.   Newton Pigg, DO Hospitalist

## 2023-02-12 ENCOUNTER — Inpatient Hospital Stay (HOSPITAL_COMMUNITY): Payer: Medicare HMO

## 2023-02-12 DIAGNOSIS — R410 Disorientation, unspecified: Secondary | ICD-10-CM | POA: Diagnosis not present

## 2023-02-12 DIAGNOSIS — R569 Unspecified convulsions: Secondary | ICD-10-CM | POA: Diagnosis not present

## 2023-02-12 DIAGNOSIS — Z91148 Patient's other noncompliance with medication regimen for other reason: Secondary | ICD-10-CM | POA: Diagnosis not present

## 2023-02-12 LAB — COMPREHENSIVE METABOLIC PANEL
ALT: 13 U/L (ref 0–44)
AST: 16 U/L (ref 15–41)
Albumin: 3.3 g/dL — ABNORMAL LOW (ref 3.5–5.0)
Alkaline Phosphatase: 51 U/L (ref 38–126)
Anion gap: 8 (ref 5–15)
BUN: 5 mg/dL — ABNORMAL LOW (ref 8–23)
CO2: 27 mmol/L (ref 22–32)
Calcium: 8.7 mg/dL — ABNORMAL LOW (ref 8.9–10.3)
Chloride: 104 mmol/L (ref 98–111)
Creatinine, Ser: 0.64 mg/dL (ref 0.44–1.00)
GFR, Estimated: >60 mL/min (ref >60)
Glucose, Bld: 156 mg/dL — ABNORMAL HIGH (ref 70–99)
Potassium: 3.2 mmol/L — ABNORMAL LOW (ref 3.5–5.1)
Sodium: 139 mmol/L (ref 135–145)
Total Bilirubin: 0.4 mg/dL (ref 0.3–1.2)
Total Protein: 6.1 g/dL — ABNORMAL LOW (ref 6.5–8.1)

## 2023-02-12 LAB — CBC WITH DIFFERENTIAL/PLATELET
Abs Immature Granulocytes: 0.04 10*3/uL (ref 0.00–0.07)
Basophils Absolute: 0 10*3/uL (ref 0.0–0.1)
Basophils Relative: 0 %
Eosinophils Absolute: 0 10*3/uL (ref 0.0–0.5)
Eosinophils Relative: 0 %
HCT: 35.8 % — ABNORMAL LOW (ref 36.0–46.0)
Hemoglobin: 11.9 g/dL — ABNORMAL LOW (ref 12.0–15.0)
Immature Granulocytes: 1 %
Lymphocytes Relative: 14 %
Lymphs Abs: 0.9 10*3/uL (ref 0.7–4.0)
MCH: 33.3 pg (ref 26.0–34.0)
MCHC: 33.2 g/dL (ref 30.0–36.0)
MCV: 100.3 fL — ABNORMAL HIGH (ref 80.0–100.0)
Monocytes Absolute: 0.5 10*3/uL (ref 0.1–1.0)
Monocytes Relative: 9 %
Neutro Abs: 4.6 10*3/uL (ref 1.7–7.7)
Neutrophils Relative %: 76 %
Platelets: 104 10*3/uL — ABNORMAL LOW (ref 150–400)
RBC: 3.57 MIL/uL — ABNORMAL LOW (ref 3.87–5.11)
RDW: 12.3 % (ref 11.5–15.5)
WBC: 6.1 10*3/uL (ref 4.0–10.5)
nRBC: 0 % (ref 0.0–0.2)

## 2023-02-12 LAB — BRAIN NATRIURETIC PEPTIDE: B Natriuretic Peptide: 22.6 pg/mL (ref 0.0–100.0)

## 2023-02-12 LAB — MAGNESIUM: Magnesium: 1.9 mg/dL (ref 1.7–2.4)

## 2023-02-12 MED ORDER — HALOPERIDOL LACTATE 5 MG/ML IJ SOLN
2.0000 mg | Freq: Four times a day (QID) | INTRAMUSCULAR | Status: DC | PRN
  Administered 2023-02-12 (×2): 2 mg via INTRAMUSCULAR
  Filled 2023-02-12 (×2): qty 1

## 2023-02-12 MED ORDER — POTASSIUM CHLORIDE CRYS ER 20 MEQ PO TBCR
40.0000 meq | EXTENDED_RELEASE_TABLET | Freq: Once | ORAL | Status: AC
  Administered 2023-02-12: 40 meq via ORAL
  Filled 2023-02-12: qty 2

## 2023-02-12 MED ORDER — POTASSIUM CHLORIDE 2 MEQ/ML IV SOLN
INTRAVENOUS | Status: AC
  Filled 2023-02-12: qty 1000

## 2023-02-12 NOTE — Progress Notes (Addendum)
Subjective: Became agitated in the afternoon. Seen in follow up this evening. Seen by Dr. Melynda Ripple during morning rounds with Dr. Melynda Ripple.   Objective: Current vital signs: BP (!) 129/94 (BP Location: Left Arm)   Pulse (!) 111   Temp 97.9 F (36.6 C) (Oral)   Resp 17   Wt 56.4 kg   SpO2 92%   BMI 19.47 kg/m  Vital signs in last 24 hours: Temp:  [97.9 F (36.6 C)-98.5 F (36.9 C)] 97.9 F (36.6 C) (07/11 0800) Pulse Rate:  [100-111] 111 (07/11 0400) Resp:  [16-19] 17 (07/11 0400) BP: (104-129)/(53-94) 129/94 (07/11 1400) SpO2:  [92 %-97 %] 92 % (07/11 0400)  Intake/Output from previous day: 07/10 0701 - 07/11 0700 In: -  Out: 900 [Urine:900] Intake/Output this shift: Total I/O In: 130 [P.O.:120; I.V.:10] Out: 400 [Urine:400] Nutritional status:  Diet Order             Diet regular Fluid consistency: Thin  Diet effective now                   NEURO:  Mental Status: No longer agitated. Being hooked back up to LTM EEG. AA&Ox2-3 year correct but month wrong, able to state she is in the hospital because of seizures, 3 out of 3 registration, 0 out of 3 recall.  Able to name current president and last president but unable to recall any more than that.  Able to name 4 animals with 4 feet when asked. Speech/Language:  Cranial Nerves:  II: PERRL.  III, IV, VI: EOMI. Eyelids elevate symmetrically.  V: Sensation is intact to light touch and symmetrical to face.  VII: Smile is symmetrical.  VIII: hearing intact to voice. IX, X: Phonation is normal.  ZH:[id number] shrug 5/5. XII: tongue is midline without fasciculations. Motor: 5/5 strength to all muscle groups tested.  Tone is normal and bulk is normal Sensation- Intact to light touch bilaterally.  Coordination: FTN intact bilaterally. No drift.  Gait- Deferred  Lab Results: Results for orders placed or performed during the hospital encounter of 02/09/23 (from the past 48 hour(s))  Urinalysis, Routine w reflex microscopic  -Urine, Clean Catch     Status: Abnormal   Collection Time: 02/10/23  4:55 PM  Result Value Ref Range   Color, Urine YELLOW YELLOW   APPearance CLEAR CLEAR   Specific Gravity, Urine 1.025 1.005 - 1.030   pH 5.0 5.0 - 8.0   Glucose, UA NEGATIVE NEGATIVE mg/dL   Hgb urine dipstick SMALL (A) NEGATIVE   Bilirubin Urine NEGATIVE NEGATIVE   Ketones, ur 80 (A) NEGATIVE mg/dL   Protein, ur 30 (A) NEGATIVE mg/dL   Nitrite NEGATIVE NEGATIVE   Leukocytes,Ua NEGATIVE NEGATIVE   RBC / HPF 0-5 0 - 5 RBC/hpf   WBC, UA 0-5 0 - 5 WBC/hpf   Bacteria, UA RARE (A) NONE SEEN   Squamous Epithelial / HPF 0-5 0 - 5 /HPF   Mucus PRESENT     Comment: Performed at [facility], [address] [place], [place] [address]  CBC with Differential/Platelet     Status: Abnormal   Collection Time: 02/11/23  2:02 AM  Result Value Ref Range   WBC 5.9 4.0 - 10.5 K/uL   RBC 3.63 (L) 3.87 - 5.11 MIL/uL   Hemoglobin 12.3 12.0 - 15.0 g/dL   HCT 28.4 13.2 - 44.0 %   MCV 100.0 80.0 - 100.0 fL   MCH 33.9 26.0 - 34.0 pg   MCHC 33.9  30.0 - 36.0 g/dL   RDW 16.1 09.6 - 04.5 %   Platelets 111 (L) 150 - 400 K/uL    Comment: REPEATED TO VERIFY   nRBC 0.0 0.0 - 0.2 %   Neutrophils Relative % 69 %   Neutro Abs 4.1 1.7 - 7.7 K/uL   Lymphocytes Relative 20 %   Lymphs Abs 1.2 0.7 - 4.0 K/uL   Monocytes Relative 9 %   Monocytes Absolute 0.5 0.1 - 1.0 K/uL   Eosinophils Relative 1 %   Eosinophils Absolute 0.0 0.0 - 0.5 K/uL   Basophils Relative 0 %   Basophils Absolute 0.0 0.0 - 0.1 K/uL   Immature Granulocytes 1 %   Abs Immature Granulocytes 0.03 0.00 - 0.07 K/uL    Comment: Performed at [facility], [address], [place], [place] [address]  Brain natriuretic peptide     Status: None   Collection Time: 02/11/23  2:02 AM  Result Value Ref Range   B Natriuretic Peptide 38.5 0.0 - 100.0 pg/mL    Comment: Performed at [facility], [address], [place], [place] [address]  Magnesium     Status: None    Collection Time: 02/11/23  2:02 AM  Result Value Ref Range   Magnesium 1.8 1.7 - 2.4 mg/dL    Comment: Performed at [facility], [address], [place], [place] [address]  Comprehensive metabolic panel     Status: Abnormal   Collection Time: 02/11/23  2:02 AM  Result Value Ref Range   Sodium 137 135 - 145 mmol/L   Potassium 3.0 (L) 3.5 - 5.1 mmol/L   Chloride 104 98 - 111 mmol/L   CO2 25 22 - 32 mmol/L   Glucose, Bld 137 (H) 70 - 99 mg/dL    Comment: Glucose reference range applies only to samples taken after fasting for at least 8 hours.   BUN 6 (L) 8 - 23 mg/dL   Creatinine, Ser 2.13 0.44 - 1.00 mg/dL   Calcium 8.5 (L) 8.9 - 10.3 mg/dL   Total Protein 5.9 (L) 6.5 - 8.1 g/dL   Albumin 3.4 (L) 3.5 - 5.0 g/dL   AST 16 15 - 41 U/L   ALT 14 0 - 44 U/L   Alkaline Phosphatase 45 38 - 126 U/L   Total Bilirubin 0.5 0.3 - 1.2 mg/dL   GFR, Estimated >08 >65 mL/min    Comment: (NOTE) Calculated using the CKD-EPI Creatinine Equation (2021)    Anion gap 8 5 - 15    Comment: Performed at [facility], [address] [place], [place] [address]  Urinalysis, Routine w reflex microscopic -Urine, Clean Catch     Status: Abnormal   Collection Time: 02/11/23  5:31 PM  Result Value Ref Range   Color, Urine YELLOW YELLOW   APPearance CLEAR CLEAR   Specific Gravity, Urine 1.006 1.005 - 1.030   pH 6.0 5.0 - 8.0   Glucose, UA NEGATIVE NEGATIVE mg/dL   Hgb urine dipstick SMALL (A) NEGATIVE   Bilirubin Urine NEGATIVE NEGATIVE   Ketones, ur NEGATIVE NEGATIVE mg/dL   Protein, ur NEGATIVE NEGATIVE mg/dL   Nitrite NEGATIVE NEGATIVE   Leukocytes,Ua NEGATIVE NEGATIVE   RBC / HPF 0-5 0 - 5 RBC/hpf   WBC, UA 0-5 0 - 5 WBC/hpf   Bacteria, UA NONE SEEN NONE SEEN   Squamous Epithelial / HPF 0-5 0 - 5 /HPF    Comment: Performed at [facility],  [address], [place], [place] [address]  CBC with Differential/Platelet     Status: Abnormal   Collection Time: 02/12/23  3:54 AM  Result  Value Ref Range   WBC 6.1 4.0 - 10.5 K/uL   RBC 3.57 (L) 3.87 - 5.11 MIL/uL   Hemoglobin 11.9 (L) 12.0 - 15.0 g/dL   HCT 32.4 (L) 40.1 - 02.7 %   MCV 100.3 (H) 80.0 - 100.0 fL   MCH 33.3 26.0 - 34.0 pg   MCHC 33.2 30.0 - 36.0 g/dL   RDW 25.3 66.4 - 40.3 %   Platelets 104 (L) 150 - 400 K/uL    Comment: REPEATED TO VERIFY   nRBC 0.0 0.0 - 0.2 %   Neutrophils Relative % 76 %   Neutro Abs 4.6 1.7 - 7.7 K/uL   Lymphocytes Relative 14 %   Lymphs Abs 0.9 0.7 - 4.0 K/uL   Monocytes Relative 9 %   Monocytes Absolute 0.5 0.1 - 1.0 K/uL   Eosinophils Relative 0 %   Eosinophils Absolute 0.0 0.0 - 0.5 K/uL   Basophils Relative 0 %   Basophils Absolute 0.0 0.0 - 0.1 K/uL   Immature Granulocytes 1 %   Abs Immature Granulocytes 0.04 0.00 - 0.07 K/uL    Comment: Performed at [facility], [address], [place], [place] [address]  Brain natriuretic peptide     Status: None   Collection Time: 02/12/23  3:54 AM  Result Value Ref Range   B Natriuretic Peptide 22.6 0.0 - 100.0 pg/mL    Comment: Performed at [facility], [address], [place], [place] [address]  Magnesium     Status: None   Collection Time: 02/12/23  3:54 AM  Result Value Ref Range   Magnesium 1.9 1.7 - 2.4 mg/dL    Comment: Performed at [facility], [address] [place], [place] [address]  Comprehensive metabolic panel     Status: Abnormal   Collection Time: 02/12/23  3:54 AM  Result Value Ref Range   Sodium 139 135 - 145 mmol/L   Potassium 3.2 (L) 3.5 - 5.1 mmol/L   Chloride 104 98 - 111 mmol/L   CO2 27 22 - 32 mmol/L   Glucose, Bld 156 (H) 70 - 99 mg/dL    Comment: Glucose reference range applies only to samples taken after fasting for at least 8 hours.   BUN 5 (L) 8 - 23 mg/dL   Creatinine, Ser 3.29 0.44 - 1.00 mg/dL   Calcium 8.7 (L) 8.9 - 10.3 mg/dL   Total Protein 6.1 (L) 6.5 - 8.1 g/dL   Albumin 3.3 (L) 3.5 - 5.0 g/dL   AST 16 15 - 41 U/L   ALT 13 0 - 44 U/L   Alkaline Phosphatase 51 38  - 126 U/L   Total Bilirubin 0.4 0.3 - 1.2 mg/dL   GFR, Estimated >51 >88 mL/min    Comment: (NOTE) Calculated using the CKD-EPI Creatinine Equation (2021)    Anion gap 8 5 - 15    Comment: Performed at [facility], [address], [place], [place] [address]    No results found for this or any previous visit (from the past 240 hour(s)).  Lipid Panel No results for input(s): "CHOL", "TRIG", "HDL", "CHOLHDL", "VLDL", "LDLCALC" in the last 72 hours.  Studies/Results: No results found.  Medications: Scheduled:  Chlorhexidine Gluconate Cloth  6 each Topical Daily   vitamin B-12  2,000 mcg Oral Daily  enoxaparin (LOVENOX) injection  40 mg Subcutaneous Q24H   gabapentin  100 mg Oral TID   lacosamide  200 mg Oral BID   oxybutynin  5 mg Oral QHS   phenytoin  100 mg Oral QPM   phenytoin  130 mg Oral Daily   sodium chloride flush  10-40 mL Intracatheter Q12H   Continuous:  levETIRAcetam 500 mg (02/12/23 1414)   CT Head- No acute changes, encephalomalacia right parietoccipital area    EEG 02/09/2023:  ABNORMALITY - Spike and waves, right hemisphere, maximal right posterior quadrant - Continuous rhythmic slow, right hemisphere, maximal right posterior quadrant IMPRESSION: This study showed evidence of epileptogenicity and cortical dysfunction arising from right hemisphere, maximal right posterior quadrant. This EEG pattern is on the ictal-interictal continuum with potential for seizures. No definite seizures were seen throughout the recording.   LTM EEG 7/9:  ABNORMALITY - Seizure without clinical signs, right posterior quadrant - Lateralized periodic discharges with overriding fast activity, right hemisphere, maximal right posterior quadrant ( LPD+F) - Spike, left anterior temporal - Continuous rhythmic slow, right hemisphere, maximal right posterior quadrant  IMPRESSION: This study showed 56 seizures without clinical signs arising from  right posterior quadrant, lasting  about 45 seconds to 1 minute. Last seizure was noted on 02/10/2023 at 1313.  Additionally there was evidence of epileptogenicity and cortical dysfunction arising from right hemisphere, maximal right posterior quadrant. There was also independent epileptogenicity arising from left anterior temporal region.    LTM EEG 7/10:  ABNORMALITY - Seizure without clinical signs, right posterior quadrant - Lateralized periodic discharges with overriding fast activity, right hemisphere, maximal right posterior quadrant ( LPD+F) - Continuous rhythmic slow, right hemisphere, maximal right posterior quadrant IMPRESSION: This study showed one seizure without clinical signs on 02/10/2023 at 1913  arising from  right posterior quadrant, lasting about 45 seconds. Additionally there was evidence of epileptogenicity and cortical dysfunction arising from right hemisphere, maximal right posterior quadrant. Study has improved significantly compared to previous day.    LTM EEG 7/11: ABNORMALITY - Sharp waves, right posterior quadrant ( LPD+F) - Continuous rhythmic slow, right hemisphere, maximal right posterior quadrant IMPRESSION: This study showed evidence of epileptogenicity and cortical dysfunction arising from right hemisphere, maximal right posterior quadrant. No further seizures were noted.    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 She presents with breakthrough seizures in the setting of medication noncompliance due to changes in insurance causing delay in receiving her Vimpat.   - Exam this evening is unchanged from yesterday.   - CT head without acute changes. No recent trauma. Chronic encephalomalacia of the right parietoccipital area is noted - LTM EEG over the last 24 hours was significantly improved with no seizures occurring in the record. Continues to have continued lateralized periodic  discharges with overriding fast activity, right hemisphere, maximal right posterior quadrant (LPD+F) - Ammonia level normal at 33 - Overall impression: The lapse in AED coverage at home is the most likely etiology for her breakthrough seizures. She is clinically improving with correlating improvement on EEG as well   Recommendations: - Continue home dosing regimen of Vimpat 200mg  bid + dilantin 130mg  in AM and 100mg  in PM (see Dr. Rosalyn Gess note from May 2024, dilantin toxicity has been an issue so regardless of seizure recurrence, DO NOT recommend increasing dilantin even in presence of subtherapeutic levels)  - Neurontin 100 mg TID was added this admission -  Discontinuing Keppra as it is felt to have possibly resulted in her agitation this afternoon.  - Starting another trial of Onfi at 5 mg po qd. Risks/benefits discussed with patient and daughter, who gave informed consent.   - LTM EEG was discontinued this morning (Thursday). Given recurrent agitation suspicious for complex partial seizure activity, LTM EEG is being reconnected (order placed at 4:40 PM) - Avoid tramadol, as this decreases seizure threshold  - Of note, rectal versus nasal route benzo would be helpful, as patient's daughter only has PO lorazepam for breakthrough seizures -Continue B12 at 2 mg po every day.     LOS: 2 days   @Electronically  signed: Dr. Caryl Pina 02/12/2023  4:35 PM

## 2023-02-12 NOTE — Progress Notes (Signed)
  Inpatient Rehabilitation Admissions Coordinator   Met with patient at bedside for rehab assessment and then contacted her daughter by phone. Patient quite confused so discussed rehab with daughter by phone.  We discussed goals and expectations of a possible CIR admit. Prior to admit patient was independent with all mobility and ADLs. Handles her own finances and cognitively intact. Her current cognition is not her baseline. She has had recent new dx of Stage 1 breast cancer and was to start radiation. Daughter prefers CIR admit prior to return home. Daughter works with workers comp from home and can provide 24/7 assist. Daughter visits patient after work day here. I will begin insurance Auth with [facility] for possible CIR admit pending approval. Please call me with any questions.   Ottie Glazier, RN, MSN Rehab Admissions Coordinator [phone]

## 2023-02-12 NOTE — Progress Notes (Signed)
LTM EEG hooked up and running - no initial skin breakdown - push button tested - [facility] monitoring.  

## 2023-02-12 NOTE — Progress Notes (Signed)
Physical Therapy Treatment Patient Details Name: [name] MRN: [id number] DOB: 01/03/1956 Today's Date: 02/12/2023   History of Present Illness 67 y.o. female presents to [facility] hospital on 02/09/2023 after multiple seizures at home. Pt with another seizure on morning of 7/9. PMH: cerebral hemorrhage 1989, asthma, COPD, DVT, GERD, seizures, CVA, tunnel vision, neuropathy    PT Comments  Tolerated treatment well. Eager to get out of bed but confused, a bit agitated by bed alarm which she is confusing with her phone and blames her children (not present during session.) Oriented patient to situation and safety protocols, role for alarm, and call bell for needs. Impulsive and reduced safety awareness with decreased awareness of deficits as well. Min assist for walker control and balance, showing rightward drift with gait. No overt buckling, but shows difficulty navigating in congested areas, erratic control of RW. Patient will continue to benefit from skilled physical therapy services to further improve independence with functional mobility.     Assistance Recommended at Discharge Frequent or constant Supervision/Assistance  If plan is discharge home, recommend the following:  Can travel by private vehicle    Assistance with cooking/housework;Direct supervision/assist for medications management;Direct supervision/assist for financial management;Assist for transportation;Help with stairs or ramp for entrance;A little help with walking and/or transfers;A little help with bathing/dressing/bathroom      Equipment Recommendations  BSC/3in1    Recommendations for Other Services Rehab consult     Precautions / Restrictions Precautions Precautions: Fall Precaution Comments: EEG Restrictions Weight Bearing Restrictions: No     Mobility  Bed Mobility Overal bed mobility: Needs Assistance Bed Mobility: Supine to Sit, Sit to Supine     Supine to sit: Supervision, HOB elevated Sit to supine:  Supervision   General bed mobility comments: Supervision for safety, assisted with lines/leads.    Transfers Overall transfer level: Needs assistance Equipment used: Rolling walker (2 wheels) Transfers: Sit to/from Stand Sit to Stand: Min assist           General transfer comment: Impulsive to rise. Min assist for balance, cues for RW use to stabilize herself. Needs cues and assist to manage lines/leads, unaware of safety issue with moving too quickly while attached to multiple medical devices.    Ambulation/Gait Ambulation/Gait assistance: Min assist Gait Distance (Feet): 80 Feet Assistive device: Rolling walker (2 wheels) Gait Pattern/deviations: Step-through pattern, Decreased stride length, Drifts right/left, Staggering right, Trunk flexed Gait velocity: reduced Gait velocity interpretation: <1.31 ft/sec, indicative of household ambulator   General Gait Details: Impulsive with movements, erratic RW control. Min assist for RW control in congested areas and with turns. Intermittent VC for awareness and walker placement, shows rightward drift intermittent stumbling. Without overt buckling during bout. Able to recall room by sight, but not number.   Stairs             Wheelchair Mobility     Tilt Bed    Modified Rankin (Stroke Patients Only)       Balance Overall balance assessment: Needs assistance Sitting-balance support: No upper extremity supported, Feet supported Sitting balance-Leahy Scale: Fair     Standing balance support: Single extremity supported Standing balance-Leahy Scale: Poor                              Cognition Arousal/Alertness: Awake/alert Behavior During Therapy: Impulsive Overall Cognitive Status: Impaired/Different from baseline Area of Impairment: Orientation, Memory, Safety/judgement, Awareness, Following commands, Problem solving  Orientation Level: Disoriented to, Situation   Memory:  Decreased recall of precautions, Decreased short-term memory Following Commands: Follows one step commands consistently, Follows multi-step commands inconsistently Safety/Judgement: Decreased awareness of safety, Decreased awareness of deficits Awareness: Intellectual Problem Solving: Difficulty sequencing, Requires verbal cues General Comments: Confused, unaware of situation entirely. States children are causing her phone to ring when she tries to get up (Oriented to bed alarm purpose and safety awareness.)        Exercises      General Comments General comments (skin integrity, edema, etc.): SpO2 96% on RA throughout session. HR 120 at max, 112 at nadir.      Pertinent Vitals/Pain Pain Assessment Pain Assessment: No/denies pain    Home Living                          Prior Function            PT Goals (current goals can now be found in the care plan section) Acute Rehab PT Goals Patient Stated Goal: to return to prior level of independence PT Goal Formulation: With patient Time For Goal Achievement: 02/25/23 Potential to Achieve Goals: Good Progress towards PT goals: Progressing toward goals    Frequency    Min 4X/week      PT Plan Current plan remains appropriate    Co-evaluation              AM-PAC PT "6 Clicks" Mobility   Outcome Measure  Help needed turning from your back to your side while in a flat bed without using bedrails?: A Little Help needed moving from lying on your back to sitting on the side of a flat bed without using bedrails?: A Little Help needed moving to and from a bed to a chair (including a wheelchair)?: A Little Help needed standing up from a chair using your arms (e.g., wheelchair or bedside chair)?: A Little Help needed to walk in hospital room?: A Little Help needed climbing 3-5 steps with a railing? : A Lot 6 Click Score: 17    End of Session Equipment Utilized During Treatment: Gait belt Activity Tolerance:  Patient tolerated treatment well Patient left: in bed;with call bell/phone within reach;with bed alarm set;Other (comment) (Pads on rails.) Nurse Communication: Mobility status PT Visit Diagnosis: Other abnormalities of gait and mobility (R26.89);Muscle weakness (generalized) (M62.81);Difficulty in walking, not elsewhere classified (R26.2)     Time: 1610-9604 PT Time Calculation (min) (ACUTE ONLY): 18 min  Charges:    $Gait Training: 8-22 mins PT General Charges $$ ACUTE PT VISIT: 1 Visit                     Kathlyn Sacramento, PT, DPT [facility]  Rehabilitation Services Physical Therapist Office: [phone] Website: [email or web address]    Berton Mount 02/12/2023, 3:23 PM

## 2023-02-12 NOTE — Evaluation (Signed)
Occupational Therapy Evaluation Patient Details Name: [name] MRN: [id number] DOB: 10/21/1955 Today's Date: 02/12/2023   History of Present Illness 67 y.o. female presents to [facility] on 02/09/2023 after multiple seizures at home. Pt with another seizure on morning of 7/9. PMH: cerebral hemorrhage 1989, asthma, COPD, DVT, GERD, seizures, CVA, tunnel vision, neuropathy   Clinical Impression   At baseline, pt completes ADLs and functional mobility Independently with assistance from family for transportation an meal prep. Pt now presents with decreased cognition, decreased activity tolerance, decreased sitting and standing balance during functional tasks, decreased B UE proprioception and fine motor coordination, and decreased safety and independence with ADLs and functional transfers/mobility. Pt currently demonstrates ability to complete UB ADLs with Set up to Min guard assist, LB ADLs with Min guard to Min assist, and functional mobility/transfers with a RW with Min assist. Pt will benefit from acute skilled OT services to address deficits outlined below, decrease caregiver burden, and increase safety and independence with ADLs, functional transfers, and functional mobility. Post acute discharge, pt will benefit from intensive inpatient skilled rehab services > 3 hours per day to maximize rehab potential.      Recommendations for follow up therapy are one component of a multi-disciplinary discharge planning process, led by the attending physician.  Recommendations may be updated based on patient status, additional functional criteria and insurance authorization.   Assistance Recommended at Discharge Frequent or constant Supervision/Assistance  Patient can return home with the following A little help with walking and/or transfers;A little help with bathing/dressing/bathroom;Assistance with cooking/housework;Direct supervision/assist for medications management;Direct supervision/assist for  financial management;Assist for transportation;Help with stairs or ramp for entrance;Assistance with feeding (Set up for self feeding)    Functional Status Assessment  Patient has had a recent decline in their functional status and demonstrates the ability to make significant improvements in function in a reasonable and predictable amount of time.  Equipment Recommendations  Other (comment) (Defer to next level of care)    Recommendations for Other Services Rehab consult     Precautions / Restrictions Precautions Precautions: Fall Precaution Comments: EEG Restrictions Weight Bearing Restrictions: No      Mobility Bed Mobility Overal bed mobility: Needs Assistance Bed Mobility: Supine to Sit, Sit to Supine     Supine to sit: Supervision, HOB elevated Sit to supine: Supervision   General bed mobility comments: Supervision for safety, assisted with lines/leads.    Transfers Overall transfer level: Needs assistance Equipment used: Rolling walker (2 wheels) Transfers: Sit to/from Stand, Bed to chair/wheelchair/BSC Sit to Stand: Min assist     Step pivot transfers: Min assist     General transfer comment: Pt currently frequently impusive with movement and requires cues for safety and sequencing.      Balance Overall balance assessment: Needs assistance Sitting-balance support: No upper extremity supported, Feet supported Sitting balance-Leahy Scale: Fair     Standing balance support: Single extremity supported, Bilateral upper extremity supported, During functional activity, Reliant on assistive device for balance Standing balance-Leahy Scale: Poor                             ADL either performed or assessed with clinical judgement   ADL Overall ADL's : Needs assistance/impaired Eating/Feeding: Set up;Sitting   Grooming: Oral care;Wash/dry hands;Wash/dry face;Set up;Sitting   Upper Body Bathing: Min guard;Sitting   Lower Body Bathing: Minimal  assistance;Sit to/from stand;Cueing for safety   Upper Body Dressing : Min guard;Sitting  Lower Body Dressing: Min guard;Cueing for safety;Sit to/from stand;Cueing for sequencing   Toilet Transfer: Minimal assistance;Cueing for safety;Ambulation;Regular Toilet;Rolling walker (2 wheels);Grab bars   Toileting- Clothing Manipulation and Hygiene: Minimal assistance;Cueing for safety;Cueing for sequencing;Sit to/from stand       Functional mobility during ADLs: Minimal assistance;Rolling walker (2 wheels);Cueing for safety;Cueing for sequencing General ADL Comments: Pt requires cues to bring attention to task and for initiation but shows fair attention to task once engaged in an activity. Pt currently frequently impusive with movement and requires cues for safety and sequencing. Pt with poor safety awarenss and poor insight into deficits throughout tasks.     Vision Patient Visual Report: Other (comment) (Pt unable to report and unable to consistantly follow 1-step instructions for vision screen this day.)       Perception     Praxis      Pertinent Vitals/Pain Pain Assessment Pain Assessment: No/denies pain     Hand Dominance Right   Extremity/Trunk Assessment Upper Extremity Assessment Upper Extremity Assessment: RUE deficits/detail;LUE deficits/detail RUE Sensation: decreased proprioception RUE Coordination: decreased fine motor LUE Sensation: decreased proprioception LUE Coordination: decreased fine motor   Lower Extremity Assessment Lower Extremity Assessment: Defer to PT evaluation   Cervical / Trunk Assessment Cervical / Trunk Assessment: Kyphotic   Communication Communication Communication: No difficulties   Cognition Arousal/Alertness: Awake/alert Behavior During Therapy: Flat affect, Impulsive, Anxious, Restless Overall Cognitive Status: Impaired/Different from baseline Area of Impairment: Orientation, Attention, Memory, Following commands, Safety/judgement,  Awareness, Problem solving                 Orientation Level: Disoriented to, Situation Current Attention Level: Focused (Largely focused, but pt demonstratinf sustained attention during oral hygeine task this session) Memory: Decreased recall of precautions, Decreased short-term memory Following Commands: Follows one step commands consistently, Follows one step commands with increased time Safety/Judgement: Decreased awareness of safety, Decreased awareness of deficits Awareness: Intellectual Problem Solving: Slow processing, Difficulty sequencing, Requires verbal cues General Comments: Pt confused throughout session. Pt able to report minimal details regarding PLOF. Pt unable to state information regarding home layout and assist at home due to pt becoming distracted talking about her family.     General Comments  VSS on 2L continuous O2 through nasal cannula throughout session.    Exercises     Shoulder Instructions      Home Living Family/patient expects to be discharged to:: Private residence Living Arrangements: Children;Other relatives Available Help at Discharge: Family;Available 24 hours/day Type of Home: House Home Access: Level entry (Per chart review) Entrance Stairs-Number of Steps: level entry Entrance Stairs-Rails: None Home Layout: Two level Alternate Level Stairs-Number of Steps: flight (Per chart review, pt has a stair lift)   Bathroom Shower/Tub: Tub/shower unit (per chart review)   Bathroom Toilet: Standard (per chart review) Bathroom Accessibility: Yes (per chart review) How Accessible: Accessible via walker (per chart review) Home Equipment: Rolling Walker (2 wheels) (per chart review)      Lives With: Daughter (per chart review)    Prior Functioning/Environment Prior Level of Function : Independent/Modified Independent             Mobility Comments: Independent  without an AD. pt reports falls with seizures but no other falls ADLs  Comments: Pt reports Independent with ADLs. Per chart review, pt receives assistance with transportation and meal prep.        OT Problem List: Decreased activity tolerance;Impaired balance (sitting and/or standing);Decreased coordination;Decreased cognition;Decreased safety awareness  OT Treatment/Interventions: Self-care/ADL training;Therapeutic exercise;Therapeutic activities;Patient/family education;Balance training;Cognitive remediation/compensation;DME and/or AE instruction    OT Goals(Current goals can be found in the care plan section) Acute Rehab OT Goals Patient Stated Goal: Pt would like to be home to celebrate her birthday with family. OT Goal Formulation: Patient unable to participate in goal setting Time For Goal Achievement: 02/26/23 Potential to Achieve Goals: Good ADL Goals Pt Will Perform Grooming: with supervision;standing Pt Will Perform Lower Body Bathing: with supervision;sit to/from stand Pt Will Transfer to Toilet: with modified independence;ambulating;regular height toilet (with least restrictive AD) Pt Will Perform Toileting - Clothing Manipulation and hygiene: with supervision;sit to/from stand Additional ADL Goal #1: Patient will demonstrate ability to appropriately sequence and complete a 3-step functional or therapeutic tasks while demonstrating Selective attention and Emergent awareness with Supervision. Additional ADL Goal #2: Patient will demonstrate ability to follow a 2-step command in 5 out of 6 opportunities during a functional or therapeutic task.  OT Frequency: Min 2X/week    Co-evaluation              AM-PAC OT "6 Clicks" Daily Activity     Outcome Measure Help from another person eating meals?: A Little Help from another person taking care of personal grooming?: A Little Help from another person toileting, which includes using toliet, bedpan, or urinal?: A Little Help from another person bathing (including washing, rinsing, drying)?: A  Little Help from another person to put on and taking off regular upper body clothing?: A Little Help from another person to put on and taking off regular lower body clothing?: A Little 6 Click Score: 18   End of Session Equipment Utilized During Treatment: Rolling walker (2 wheels);Oxygen Nurse Communication: Mobility status;Other (comment) (Pt sitting in chair with chair alarm set and call bell within reach.)  Activity Tolerance: Patient tolerated treatment well;Other (comment) (Pt limited by current cognitive level.) Patient left: in chair;with call bell/phone within reach;with chair alarm set  OT Visit Diagnosis: Unsteadiness on feet (R26.81);Other abnormalities of gait and mobility (R26.89);Other symptoms and signs involving cognitive function;Ataxia, unspecified (R27.0)                Time: 8416-6063 OT Time Calculation (min): 33 min Charges:  OT General Charges $OT Visit: 1 Visit OT Evaluation $OT Eval Low Complexity: 1 Low OT Treatments $Self Care/Home Management : 8-22 mins  Ercell Perlman "Orson Eva., OTR/L, MA Acute Rehab [phone]   Lendon Colonel 02/12/2023, 4:18 PM

## 2023-02-12 NOTE — Procedures (Addendum)
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 She presents with breakthrough seizures in the setting of medication noncompliance due to changes in insurance causing delay in receiving her Vimpat.  EEG to evaluate for seizure   Level of alertness: Awake, asleep   AEDs during EEG study: LCM, PHT, GBP   Technical aspects: This EEG study was done with scalp electrodes positioned according to the 10-20 International system of electrode placement. Electrical activity was reviewed with band pass filter of 1-70Hz , sensitivity of 7 uV/mm, display speed of 62mm/sec with a 60Hz  notched filter applied as appropriate. EEG data were recorded continuously and digitally stored.  Video monitoring was available and reviewed as appropriate.   Description: The posterior dominant rhythm consists of 8-9 Hz activity of moderate voltage (25-35 uV) seen predominantly in posterior head regions, symmetric and reactive to eye opening and eye closing. Sleep was characterized by sleep spindles (12-14hz ), maximal fronto-central region. EEG showed continuous polymorphic sharply contoured rhythmic 3 to 6 Hz theta-delta slowing in right hemisphere, maximal right posterior quadrant. Sharp waves were noted in right posterior quadrant. Hyperventilation and photic stimulation were not performed.     Of note,study was difficult to interpret due to significant electrode artifact after arund 0700 on 02/12/2023.    ABNORMALITY - Sharp waves, right posterior quadrant - Continuous rhythmic slow, right hemisphere, maximal right posterior quadrant   IMPRESSION: This study showed evidence of epileptogenicity and cortical dysfunction arising from right  hemisphere, maximal right posterior quadrant. No further seizures were noted.   [name]

## 2023-02-12 NOTE — Progress Notes (Signed)
Subjective: No further seizure overnight. Does have tangential speech but answers all questions appropriately  ROS: negative except above  Examination  Vital signs in last 24 hours: Temp:  [97.7 F (36.5 C)-98.5 F (36.9 C)] 98 F (36.7 C) (07/11 2003) Pulse Rate:  [100-111] 100 (07/11 2003) Resp:  [16-22] 22 (07/11 2003) BP: (104-129)/(53-94) 108/56 (07/11 2003) SpO2:  [92 %-96 %] 95 % (07/11 2003)  General: lying in bed, NAD Neuro: MS: Alert, oriented to place and person but not to time, follows commands CN: pupils equal and reactive,  EOMI, face symmetric, tongue midline, normal sensation over face, Motor: 5/5 strength in all 4 extremities Gait: not tested  Basic Metabolic Panel: Recent Labs  Lab 02/09/23 1128 02/10/23 0318 02/11/23 0202 02/12/23 0354  NA 141 140 137 139  K 3.4* 3.0* 3.0* 3.2*  CL 104 104 104 104  CO2 22 23 25 27   GLUCOSE 110* 85 137* 156*  BUN 15 12 6* 5*  CREATININE 1.08* 0.78 0.69 0.64  CALCIUM 9.0 8.5* 8.5* 8.7*  MG  --   --  1.8 1.9    CBC: Recent Labs  Lab 02/09/23 1128 02/10/23 0318 02/11/23 0202 02/12/23 0354  WBC 5.4 5.4 5.9 6.1  NEUTROABS  --   --  4.1 4.6  HGB 12.9 12.0 12.3 11.9*  HCT 39.0 36.9 36.3 35.8*  MCV 103.4* 102.8* 100.0 100.3*  PLT 118* 108* 111* 104*     Coagulation Studies: No results for input(s): "LABPROT", "INR" in the last 72 hours.  Imaging CTH wo contrast 02/09/2023: No evidence of an acute intracranial abnormality. 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 Correlate with the surgical history. 3. Right maxillary sinusitis. Small-volume fluid within the left mastoid air cells.  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 She presents with breakthrough seizures in the setting of medication  noncompliance due to changes in insurance causing delay in receiving her Vimpat.    Epilepsy with breakthrough seizure - Most likely etiology for her breakthrough seizures is missing vimpat - Does appear to have some tangential thoughts today, possible delirium vs due to keppra    Recommendations: - Continue home dosing regimen of Vimpat 200mg  bid + dilantin 130mg  in AM and 100mg  in PM (see Dr. Rosalyn Gess note from May 2024, dilantin toxicity has been an issue so regardless of seizure recurrence, DO NOT recommend increasing dilantin even in presence of subtherapeutic levels)  - Neurontin 100 mg TID was added this admission - Although Keppra is listed in HPI as previously being ineffective, with side effect apparently of seizures, will have initiated another trial of this medication given frequent electrographic seizures on LTM EEG. Continue Keppra at 500 mg IV BID. - Discontinue LTM EEG - PRN IV ativan for clinical seizure - Continue seizure precautions   [name] Epilepsy [facility] For questions after 5pm please refer to AMION to reach the Neurologist on call
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
## 2023-02-12 NOTE — PMR Pre-admission (Signed)
PMR Admission Coordinator Pre-Admission Assessment  Patient: [name] is an 67 y.o., female MRN: [id number] DOB: July 30, 1956 Height:   Weight: 56.4 kg              Insurance Information HMO: yes    PPO:      PCP:      IPA:      80/20:      OTHER:  PRIMARY: [employer]      Policy#: [id number]      Subscriber: pt CM Name: [name]      Phone#: [phone]     Fax#: [phone] Pre-Cert#: [id number] approved 13 days 7/12 until 7/24      Employer:  Benefits:  Phone #: [phone]     Name: 7/11 Eff. Date: 01/03/23     Deduct: none      Out of Pocket Max: $5500      Life Max: none  CIR: $295 co pay per day days 1 until 6      SNF: no copay per day days 1 until 20; $203 co pay per day days 21 until 100 Outpatient: $20 PER VISIT     Co-Pay:  Home Health: 100%      Co-Pay:  DME: 80%     Co-Pay: 20% Providers: in network  SECONDARY: none  Financial Counselor:       Phone#:   The Data processing manager" for patients in Inpatient Rehabilitation Facilities with attached "Privacy Act Statement-Health Care Records" was provided and verbally reviewed with: Family and patient  Emergency Contact Information Contact Information   None on File    Other Contacts     Name Relation Home Work Mobile   Luther,[name] Daughter   [phone]   Nalany, Steedley   [phone]      Current Medical History  Patient Admitting Diagnosis: Seizures  History of Present Illness: 67 year old female with history of thrombocytopenia, paroxysmal a fib, HTN, HLD, GERD, DVT, chronic pain, COPD, anxiety, hemorrhagic stroke, stress incontinence, seizure disorder, neuropathy and recent dx of breast cancer. Presented on 02/09/23 after multiple seizures at home. She has not had home Vimpat over the last few days due to trouble with in insurance. Was compliant with her Dilantin.   Neurology consulted. Head CT non acute, EEG does not show any acute seizures but shows evidence of epileptogenicity  and cortical dysfunction. She had another seizure 7/9. On home dose of Dilantin, and cannot increase dose due to history of side effects with high dose, placed on Vimpat. Patient was started on Onfi and that med was questioned for causing fevers, so discontinued. Placed on Topamax .  Developed fever with no clear source. CT chest abdomen pelvis unremarkable, MRI C-spine with contrast stable as she was having some neck pain, neck pain itself has resolved, lower extremity venous duplex without any clots, blood cultures negative, no white count or pro calcitonin, respiratory viral panel negative, strep throat negative, further workup revealed right upper extremity acute axillary vein DVT likely cause of her fevers.  Fever curve much improved after anticoagulation started. Removed PICC with RUE axillary vein DVT noted. Discussed with vascular surgeon , Dr Myra Gianotti. Recommend minimum of 4 to 6 weeks of oral anticoagulation thereafter f/u with PCP for repeat ultrasound. Risks and benefits reviewed with patient and daughter. Placed on heparin drip managed by pharmacy.   New onset atrial flutter on 7/14 with neck pain and fevers. Converted to NSR after a short course of Cardizem within a few hrs.  OP cardiology follow up.     On oxybutynin for stress incontinence. Tramadol for chronic pain. Lyrica switched to Neurontin for neuropathy. Lovenox for DVT prophylaxis.   Patient's medical record from [facility] has been reviewed by the rehabilitation admission coordinator and physician.  Past Medical History  Past Medical History:  Diagnosis Date   Acute bronchitis 08/06/2022   Acute respiratory failure with hypoxia (HCC) 03/31/2015   Altered mental status    Asthma    Ataxia 08/26/2019   Atypical chest pain 02/17/2017   Cerebral hemorrhage (HCC) 1989   Chronic back pain    Closed fracture of distal end of left radius 07/17/2022   Closed fracture of fifth metatarsal bone 01/04/2019   Closed fracture  of proximal phalanx of great toe 01/04/2019   Closed nondisplaced fracture of styloid process of left radius 08/06/2022   COPD (chronic obstructive pulmonary disease) (HCC)    no meds per daughter   COPD with exacerbation (HCC) 03/30/2015   Costochondritis 02/17/2017   COVID-19 virus infection 04/02/2020   DDD (degenerative disc disease) 06/12/2013   DVT (deep venous thrombosis) (HCC)    "she's had several since 1990"   GERD (gastroesophageal reflux disease)    no meds per daughter   Headache    "usually around time when she's had a seizure"   History of blood transfusion    "when she was a baby"   History of kidney stones    History of stomach ulcers    Hypoxia 01/18/2015   Lactic acidosis 04/02/2020   Neuropathy    Seizures (HCC)    "because of her brain surgery"  last one 07/28/22   Sepsis secondary to UTI (HCC) 01/17/2015   Stroke [facility]) 1990's   family denies residual on 11/09/2014   Tunnel vision    "since brain OR"   Has the patient had major surgery during 100 days prior to admission? No  Family History  family history includes Cancer in her brother and mother; Cervical cancer in her maternal aunt; Colon cancer in her mother; Heart attack in her father and mother.  Current Medications   Current Facility-Administered Medications:    acetaminophen (TYLENOL) tablet 650 mg, 650 mg, Oral, Q6H PRN, 650 mg at 02/18/23 1112 **OR** acetaminophen (TYLENOL) suppository 650 mg, 650 mg, Rectal, Q6H PRN, Synetta Fail, MD   Chlorhexidine Gluconate Cloth 2 % PADS 6 each, 6 each, Topical, Daily, Leroy Sea, MD, 6 each at 02/18/23 0910   cyanocobalamin (VITAMIN B12) tablet 2,000 mcg, 2,000 mcg, Oral, Daily, Shafer, Devon, NP, 2,000 mcg at 02/19/23 0850   diltiazem (CARDIZEM) injection 10 mg, 10 mg, Intravenous, Q6H PRN, Leroy Sea, MD, 10 mg at 02/15/23 0820   diltiazem (CARDIZEM) tablet 30 mg, 30 mg, Oral, Q12H, Leroy Sea, MD, 30 mg at 02/19/23 0850    gabapentin (NEURONTIN) capsule 100 mg, 100 mg, Oral, TID, Milon Dikes, MD, 100 mg at 02/19/23 0850   haloperidol lactate (HALDOL) injection 2 mg, 2 mg, Intramuscular, Q6H PRN, Leroy Sea, MD, 2 mg at 02/12/23 1926   heparin ADULT infusion 100 units/mL (25000 units/278mL), 1,350 Units/hr, Intravenous, Continuous, Arabella Merles, RPH, Last Rate: 13.5 mL/hr at 02/19/23 0725, 1,350 Units/hr at 02/19/23 0725   ipratropium-albuterol (DUONEB) 0.5-2.5 (3) MG/3ML nebulizer solution 3 mL, 3 mL, Nebulization, Q6H PRN, Howerter, Justin B, DO, 3 mL at 02/16/23 2058   lacosamide (VIMPAT) tablet 200 mg, 200 mg, Oral, BID, Synetta Fail, MD, 200 mg at 02/19/23  0850   LORazepam (ATIVAN) injection 1 mg, 1 mg, Intravenous, Once PRN, Thedore Mins, Stanford Scotland, MD   LORazepam (ATIVAN) injection 1 mg, 1 mg, Intravenous, Q6H PRN, Leroy Sea, MD   melatonin tablet 3 mg, 3 mg, Oral, QHS PRN, Howerter, Justin B, DO, 3 mg at 02/15/23 2129   Oral care mouth rinse, 15 mL, Mouth Rinse, PRN, Leroy Sea, MD   oxybutynin (DITROPAN-XL) 24 hr tablet 5 mg, 5 mg, Oral, QHS, Synetta Fail, MD, 5 mg at 02/18/23 2241   phenytoin (DILANTIN) ER capsule 100 mg, 100 mg, Oral, QPM, Pham, Minh Q, RPH-CPP, 100 mg at 02/18/23 1522   phenytoin (DILANTIN) ER capsule 130 mg, 130 mg, Oral, Daily, Pham, Minh Q, RPH-CPP, 130 mg at 02/19/23 0850   polyethylene glycol (MIRALAX / GLYCOLAX) packet 17 g, 17 g, Oral, Daily PRN, Synetta Fail, MD   topiramate (TOPAMAX) tablet 50 mg, 50 mg, Oral, BID, Charlsie Quest, MD, 50 mg at 02/19/23 0850   traMADol (ULTRAM) tablet 100 mg, 100 mg, Oral, Q6H PRN, Leroy Sea, MD, 100 mg at 02/18/23 1521  Patients Current Diet:  Diet Order             Diet regular Fluid consistency: Thin  Diet effective now                  Precautions / Restrictions Precautions Precautions: Fall Precaution Comments: EEG Restrictions Weight Bearing Restrictions: No   Has the patient  had 2 or more falls or a fall with injury in the past year?Yes, only with seizures  Prior Activity Level Limited Community (1-2x/wk): independent with adls and mobility per dtr; does not drive  Prior Functional Level Prior Function Prior Level of Function : Independent/Modified Independent Mobility Comments: Independent  without an AD. pt reports falls with seizures but no other falls ADLs Comments: Pt reports Independent with ADLs. Per chart review, pt receives assistance with transportation and meal prep.  Self Care: Did the patient need help bathing, dressing, using the toilet or eating?  Independent  Indoor Mobility: Did the patient need assistance with walking from room to room (with or without device)? Independent  Stairs: Did the patient need assistance with internal or external stairs (with or without device)? Independent  Functional Cognition: Did the patient need help planning regular tasks such as shopping or remembering to take medications? Independent  Patient Information Are you of Hispanic, Latino/a,or Spanish origin?: A. No, not of Hispanic, Latino/a, or Spanish origin What is your race?: A. White Do you need or want an interpreter to communicate with a doctor or health care staff?: 0. No  Patient's Response To:  Health Literacy and Transportation Is the patient able to respond to health literacy and transportation needs?: Yes Health Literacy - How often do you need to have someone help you when you read instructions, pamphlets, or other written material from your doctor or pharmacy?: Never In the past 12 months, has lack of transportation kept you from medical appointments or from getting medications?: No In the past 12 months, has lack of transportation kept you from meetings, work, or from getting things needed for daily living?: No  Home Assistive Devices / Equipment Home Equipment: Agricultural consultant (2 wheels) (per chart review)  Prior Device Use: Indicate  devices/aids used by the patient prior to current illness, exacerbation or injury? None of the above  Current Functional Level Cognition  Overall Cognitive Status: Impaired/Different from baseline Current Attention Level: Sustained Orientation  Level: Oriented X4 Following Commands: Follows one step commands consistently Safety/Judgement: Decreased awareness of safety, Decreased awareness of deficits General Comments: Pt with improved overall cognition this session as compared to last skilled OT session.    Extremity Assessment (includes Sensation/Coordination)  Upper Extremity Assessment: Generalized weakness RUE Sensation: decreased proprioception RUE Coordination: decreased fine motor LUE Sensation: decreased proprioception LUE Coordination: decreased fine motor  Lower Extremity Assessment: Defer to PT evaluation    ADLs  Overall ADL's : Needs assistance/impaired Eating/Feeding: Modified independent, Sitting Grooming: Min guard, Standing, Wash/dry hands, Oral care Upper Body Bathing: Min guard, Sitting Lower Body Bathing: Minimal assistance, Sit to/from stand, Cueing for safety Upper Body Dressing : Min guard, Sitting Lower Body Dressing: Min guard, Cueing for safety, Sit to/from stand, Cueing for sequencing Toilet Transfer: Min guard, Minimal assistance, Cueing for safety, Ambulation, Regular Toilet, Grab bars (hand held assist +1 and Min guard to Min assist with gait belt) Toileting- Clothing Manipulation and Hygiene: Min guard, Cueing for sequencing, Cueing for safety, Sit to/from stand Functional mobility during ADLs: Min guard, Minimal assistance, Cueing for safety, Cueing for sequencing (hand held assist +1 and Min guard to Min assist with gait belt) General ADL Comments: Pt requires cues to bring attention to task and for initiation but shows fair attention to task once engaged in an activity. Pt currently frequently impusive with movement and requires cues for safety and  sequencing. Pt with poor safety awarenss and poor insight into deficits throughout tasks.    Mobility  Overal bed mobility: Needs Assistance Bed Mobility: Supine to Sit, Sit to Supine Supine to sit: Supervision, HOB elevated Sit to supine: Supervision General bed mobility comments: Pt sitting in recliner at beginning and end of session.    Transfers  Overall transfer level: Needs assistance Equipment used: 1 person hand held assist Transfers: Sit to/from Stand, Bed to chair/wheelchair/BSC Sit to Stand: Min guard Bed to/from chair/wheelchair/BSC transfer type:: Step pivot Step pivot transfers: Min guard, Min assist General transfer comment: cues for safety; hand held assist +1 and Min guard to Min assist with gait belt    Ambulation / Gait / Stairs / Wheelchair Mobility  Ambulation/Gait Ambulation/Gait assistance: Editor, commissioning (Feet): 60 Feet Assistive device: Rolling walker (2 wheels) Gait Pattern/deviations: Step-through pattern, Decreased stride length, Drifts right/left, Staggering right, Trunk flexed General Gait Details: Impulsive with movements, erratic RW control. Min assist for RW control in congested areas and with turns. Intermittent VC for awareness and walker placement, left drift this session Gait velocity: reduced Gait velocity interpretation: <1.31 ft/sec, indicative of household ambulator    Posture / Balance Balance Overall balance assessment: Needs assistance Sitting-balance support: No upper extremity supported, Feet supported Sitting balance-Leahy Scale: Fair Standing balance support: Single extremity supported, Bilateral upper extremity supported, No upper extremity supported, During functional activity Standing balance-Leahy Scale: Poor Standing balance comment: Reliant on therapist for balance during functional mobility. Leaned against sink counter during ADLs to maintain balance.    Special needs/care consideration Seizure precautions    Previous Home Environment  Living Arrangements: Children, Other relatives  Lives With: Daughter (per chart review) Available Help at Discharge: Family, Available 24 hours/day Type of Home: House Home Layout: Two level Alternate Level Stairs-Rails: Right Alternate Level Stairs-Number of Steps: flight (Per chart review, pt has a stair lift) Home Access: Level entry (Per chart review) Entrance Stairs-Rails: None Entrance Stairs-Number of Steps: level entry Bathroom Shower/Tub: Tub/shower unit (per chart review) Bathroom Toilet: Standard (per chart review) Bathroom Accessibility:  Yes (per chart review) How Accessible: Accessible via walker (per chart review) Home Care Services: No  Discharge Living Setting Plans for Discharge Living Setting: Lives with (comment) (daughter) Type of Home at Discharge: House Discharge Home Layout: Two level, Bed/bath upstairs Alternate Level Stairs-Rails: Right Alternate Level Stairs-Number of Steps: 15; has chair lift Discharge Home Access: Level entry Discharge Bathroom Shower/Tub: Tub/shower unit Discharge Bathroom Toilet: Standard Discharge Bathroom Accessibility: Yes How Accessible: Accessible via walker Does the patient have any problems obtaining your medications?: Yes (Describe)  Social/Family/Support Systems Patient Roles: Parent Contact Information: daughter, [name] Anticipated Caregiver: daughter, [name] Anticipated Industrial/product designer Information: see contacts Ability/Limitations of Caregiver: 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, structural Availability: 24/7 Discharge Plan Discussed with Primary Caregiver: Yes Is Caregiver In Agreement with Plan?: Yes Does Caregiver/Family have Issues with Lodging/Transportation while Pt is in Rehab?: No  Goals Patient/Family Goal for Rehab: supervision PT, supervision to min OT, supervision SLP Expected length of stay: ELOS 2 to 2 1/2 weeks Additional Information: Cognition is not at baseline;  Independent doing her own finances pta Pt/Family Agrees to Admission and willing to participate: Yes Program Orientation Provided & Reviewed with Pt/Caregiver Including Roles  & Responsibilities: Yes Information Needs to be Provided By: New dx stage 1 breast cancer 6/24 and radiation was to start  Decrease burden of Care through IP rehab admission: n/a  Possible need for SNF placement upon discharge:not anticipated  Patient Condition: This patient's medical and functional status has changed since the consult dated: 02/12/23 in which the Rehabilitation Physician determined and documented that the patient's condition is appropriate for intensive rehabilitative care in an inpatient rehabilitation facility. See "History of Present Illness" (above) for medical update. Functional changes are: overall min assist with mobility and adls. Patient's medical and functional status update has been discussed with the Rehabilitation physician and patient remains appropriate for inpatient rehabilitation. Will admit to inpatient rehab today.  Preadmission Screen Completed By:  Clois Dupes, RN MSN 02/19/2023 10:57 AM ______________________________________________________________________   Discussed status with Dr. Berline Chough on 02/19/23 at 1057 and received approval for admission today.  Admission Coordinator:  Clois Dupes RN MSN time 1057 Date 02/19/23

## 2023-02-12 NOTE — Progress Notes (Signed)
PROGRESS NOTE                                                                                                                                                                                                             Patient Demographics:    [name], is a 67 y.o. female, DOB - 12/08/1955, ZOX:[id number]  Outpatient Primary MD for the patient is [name], FNP    LOS - 2  Admit date - 02/09/2023    Chief Complaint  Patient presents with   Seizures       Brief Narrative (HPI from H&P)     67 y.o. female with medical history significant of thrombocytopenia, paroxysmal A-fib, hypertension, hyperlipidemia, GERD, DVT, chronic pain, COPD, anxiety, hemorrhagic stroke, stress incontinence, seizure disorder, neuropathy, breast cancer presenting after multiple seizures at home.  Orting to the family members patient was compliant with her Dilantin dose and was taking it on a daily basis however she did run out of Vimpat few days ago due to insurance problems, in the ER Dilantin levels were low, she was seen by neurology and admitted for ongoing seizures.   Subjective:   Patient in bed, appears comfortable, denies any headache, no fever, no chest pain or pressure, no shortness of breath , no abdominal pain. No focal weakness.   Assessment  & Plan :    Breakthrough seizures.  Ran out of Vimpat but was taking Dilantin as prescribed, Dilantin levels were still low, seen by neurology, head CT nonacute, EEG does not show any acute seizures but does show evidence of epileptogenicity and cortical dysfunction, had another seizure morning of 02/10/2023, currently on home dose of Dilantin, cannot increase the dose due to history of side effects with high-dose, continue Vimpat, seizures seems to have improved, defer further management to neurology clinically much better with daily improvement, UA stable.  If continues to improve likely  discharge home with home PT on 02/13/2023.  Mild chronic thrombocytopenia.  Stable.  Stress incontinence.  On oxybutynin  Chronic pain.  On tramadol.  Neuropathy.  Lyrica switched to Neurontin, will be discharged on Neurontin.  Hypokalemia.  Replaced      Condition - Extremely Guarded  Family Communication  :  daughter [phone]  02/10/23, updated son Sharia Reeve [phone]  on 02/11/2023 and 02/12/2023  Code Status :  Full  Consults  :  Neuro  PUD Prophylaxis :     Procedures  :     EEG - This study showed one seizure without clinical signs on 02/10/2023 at 1913  arising from  right posterior quadrant, lasting about 45 seconds. Additionally there was evidence of epileptogenicity and cortical dysfunction arising from right hemisphere, maximal right posterior quadrant.    Study has improved significantly compared to previous day.   EEG - This study showed evidence of epileptogenicity and cortical dysfunction arising from right hemisphere, maximal right posterior quadrant. This EEG pattern is on the ictal-interictal continuum with potential for seizures.  CT - 1.  No evidence of an acute intracranial abnormality. 2. 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 Correlate with the surgical history. 3. Right maxillary sinusitis. 4. Small-volume fluid within the left mastoid air cells.      Disposition Plan  :    Status is: Observation  DVT Prophylaxis  :    enoxaparin (LOVENOX) injection 40 mg Start: 02/09/23 1800    Lab Results  Component Value Date   PLT 104 (L) 02/12/2023    Diet :  Diet Order             Diet regular Fluid consistency: Thin  Diet effective now                    Inpatient Medications  Scheduled Meds:  Chlorhexidine Gluconate Cloth  6 each Topical Daily   vitamin B-12  2,000 mcg Oral Daily   enoxaparin (LOVENOX) injection  40 mg Subcutaneous Q24H    gabapentin  100 mg Oral TID   lacosamide  200 mg Oral BID   oxybutynin  5 mg Oral QHS   phenytoin  100 mg Oral QPM   phenytoin  130 mg Oral Daily   sodium chloride flush  10-40 mL Intracatheter Q12H   Continuous Infusions:  lactated ringers 1,000 mL with potassium chloride 30 mEq infusion 100 mL/hr at 02/12/23 0649   levETIRAcetam 500 mg (02/12/23 0011)   PRN Meds:.acetaminophen **OR** acetaminophen, LORazepam, melatonin, polyethylene glycol  Antibiotics  :    Anti-infectives (From admission, onward)    None         Objective:   Vitals:   02/11/23 2335 02/12/23 0000 02/12/23 0400 02/12/23 0800  BP: 104/77 123/71 (!) 109/53   Pulse: (!) 108 (!) 104 (!) 111   Resp: 19 16 17    Temp: 98 F (36.7 C) 98.1 F (36.7 C) 98.5 F (36.9 C) 97.9 F (36.6 C)  TempSrc: Oral Oral Oral Oral  SpO2: 95% 96% 92%   Weight:        Wt Readings from Last 3 Encounters:  02/09/23 56.4 kg  01/12/23 58.4 kg  12/31/22 59.4 kg     Intake/Output Summary (Last 24 hours) at 02/12/2023 0914 Last data filed at 02/12/2023 0850 Gross per 24 hour  Intake 120 ml  Output 1300 ml  Net -1180 ml     Physical Exam  Awake Alert, no focal deficits [place].AT,PERRAL Supple Neck, No JVD,   Symmetrical Chest wall movement, Good air movement bilaterally, CTAB RRR,No Gallops,Rubs or new Murmurs,  +ve B.Sounds, Abd Soft, No tenderness,   No Cyanosis, Clubbing or edema       Data Review:    Recent Labs  Lab 02/09/23 1128 02/10/23 0318 02/11/23 0202 02/12/23 0354  WBC 5.4 5.4 5.9 6.1  HGB 12.9 12.0 12.3 11.9*  HCT 39.0 36.9 36.3 35.8*  PLT 118* 108* 111* 104*  MCV 103.4* 102.8* 100.0 100.3*  MCH 34.2* 33.4 33.9 33.3  MCHC 33.1 32.5 33.9 33.2  RDW 12.4 12.2 12.2 12.3  LYMPHSABS  --   --  1.2 0.9  MONOABS  --   --  0.5 0.5  EOSABS  --   --  0.0 0.0  BASOSABS  --   --  0.0 0.0    Recent Labs  Lab 02/09/23 1128 02/10/23 0318 02/10/23 0956 02/11/23 0202 02/12/23 0354  NA 141 140  --   137 139  K 3.4* 3.0*  --  3.0* 3.2*  CL 104 104  --  104 104  CO2 22 23  --  25 27  ANIONGAP 15 13  --  8 8  GLUCOSE 110* 85  --  137* 156*  BUN 15 12  --  6* 5*  CREATININE 1.08* 0.78  --  0.69 0.64  AST 23 17  --  16 16  ALT 15 12  --  14 13  ALKPHOS 51 47  --  45 51  BILITOT 0.7 1.0  --  0.5 0.4  ALBUMIN 3.8 3.4*  --  3.4* 3.3*  AMMONIA  --   --  33  --   --   BNP  --   --   --  38.5 22.6  MG  --   --   --  1.8 1.9  CALCIUM 9.0 8.5*  --  8.5* 8.7*    Micro Results No results found for this or any previous visit (from the past 240 hour(s)).  Radiology Reports [place] EKG SITE RITE  Result Date: 02/10/2023 If Site Rite image not attached, placement could not be confirmed due to current cardiac rhythm.  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 She presents with breakthrough seizures in the setting of medication noncompliance due to changes in insurance causing delay in receiving her Vimpat.  EEG to evaluate for seizure  Level of alertness: Awake, asleep  AEDs during EEG study: LCM, PHT, GBP, Ativan  Technical aspects: This EEG study was done with scalp electrodes positioned according to the 10-20 International system of electrode placement. Electrical activity was reviewed with band pass filter of 1-70Hz , sensitivity of 7 uV/mm, display speed of 17mm/sec with a 60Hz  notched filter applied as appropriate. EEG data were recorded continuously and digitally stored.  Video monitoring was available and reviewed as appropriate.  Description: The posterior dominant rhythm consists of 8-9 Hz activity of moderate voltage (25-35  uV) seen predominantly in posterior head regions, symmetric and reactive to eye opening and eye closing. Sleep was characterized by sleep spindles (12-14hz ), maximal fronto-central region. EEG showed continuous polymorphic sharply contoured rhythmic 3 to 6 Hz theta-delta slowing in right hemisphere, maximal right posterior quadrant with waxing and waning morphology. Lateralized periodic discharges with overriding fast activity were noted in right hemisphere, maximal right posterior quadrant, at 1Hz . Independent left anterior temporal spikes were also noted. Hyperventilation and photic stimulation were not performed.   56 seizures were noted. No clinical signs were seen. EEG showed 3-5Hz  theta-delta slowing in right posterior  quadrant which the involves into right frontotemporal region admixed with sharp waves at 3-4hz . EEG then showed high amplitude sharply contoured 12-13Hz  beta activity and involved left hemisphere. Subsequently EEG showed 2-3Hz  delta slowing. Average duration of seizures was 45 seconds to 1 minute. Last seizure was noted on 02/10/2023 at 1313.  ABNORMALITY - Seizure without clinical signs, right posterior quadrant - Lateralized periodic discharges with overriding fast activity, right hemisphere, maximal right posterior quadrant ( LPD+F) - Spike, left anterior temporal - Continuous rhythmic slow, right hemisphere, maximal right posterior quadrant  IMPRESSION: This study showed 56 seizures without clinical signs arising from  right posterior quadrant, lasting about 45 seconds to 1 minute. Last seizure was noted on 02/10/2023 at 1313.  Additionally there was evidence of epileptogenicity and cortical dysfunction arising from right hemisphere, maximal right posterior quadrant. There was also independent epileptogenicity arising from left anterior temporal region. Dr. Otelia Limes was notified.  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 She presents with breakthrough seizures in the setting of medication noncompliance due to changes in insurance causing delay in receiving her Vimpat.  EEG to evaluate for seizure Level of alertness: Awake AEDs during EEG study: LCM, PHT, Ativan Technical aspects: This EEG study was done with scalp electrodes positioned according to the 10-20 International system of electrode placement. Electrical activity was reviewed with band pass filter of 1-70Hz , sensitivity of 7 uV/mm, display speed of 69mm/sec with a 60Hz  notched filter applied as appropriate. EEG data were recorded continuously and digitally stored.  Video monitoring was available and reviewed as appropriate. Description: The posterior dominant rhythm consists of 8-9 Hz activity of moderate voltage (25-35 uV) seen predominantly in posterior head regions, symmetric and reactive to eye opening and eye closing. EEG showed continuous polymorphic sharply contoured rhythmic 3 to 6 Hz theta-delta slowing in right hemisphere, maximal right posterior quadrant with waxing and waning morphology. Abundant spike and waves were noted in right hemisphere, maximal right posterior quadrant, at times qasi- periodic at 1-2Hz . Frequently, the right hemispheric spikes also involved the left  frontal region. Hyperventilation and photic stimulation were not performed.   ABNORMALITY - Spike and waves, right hemisphere, maximal right posterior quadrant - Continuous rhythmic slow, right hemisphere, maximal right posterior quadrant IMPRESSION: This study showed evidence of epileptogenicity and cortical dysfunction arising from right hemisphere, maximal right posterior quadrant.  This EEG pattern is on the ictal-interictal continuum with potential for seizures. No definite seizures were seen throughout the recording. Dr. Otelia Limes was notified. Charlsie Quest   DG Chest Portable 1 View  Result Date: 02/09/2023 CLINICAL DATA:  Cough. EXAM: PORTABLE CHEST 1 VIEW COMPARISON:  October 07, 2022. FINDINGS: The heart size and mediastinal contours are within normal limits. Both lungs are clear. The visualized skeletal structures are unremarkable. IMPRESSION: No active disease. Electronically Signed   By: Lupita Raider M.D.   On: 02/09/2023 13:37   CT HEAD WO CONTRAST ( )  Result Date: 02/09/2023 CLINICAL DATA:  Provided history: Seizure disorder, clinical change. EXAM: CT HEAD WITHOUT CONTRAST TECHNIQUE: Contiguous axial images were obtained from the base of the skull through the vertex without intravenous contrast. RADIATION  DOSE REDUCTION: This exam was performed according to the departmental dose-optimization program which includes automated exposure control, adjustment of the mA and/or kV according to patient size and/or use of iterative reconstruction technique. COMPARISON:  Prior head CT examinations 11/04/2022 and earlier. FINDINGS: Brain: Redemonstrated large focus of chronic encephalomalacia/gliosis within the posterior right cerebral hemisphere (underlying a cranioplasty). There is no acute intracranial hemorrhage. No acute demarcated cortical infarct. No extra-axial fluid collection. No evidence of an intracranial mass. No midline shift. Vascular: No hyperdense vessel.  Atherosclerotic calcifications. Skull: Right parietooccipital cranioplasty. No acute calvarial fracture or aggressive osseous lesion. Sinuses/Orbits: No orbital mass or acute orbital finding. Small to moderate-sized fluid level, and mild background mucosal thickening, within the right maxillary sinus. Other: Small-volume fluid within the left mastoid air cells. IMPRESSION: 1.  No evidence of an acute intracranial  abnormality. 2. 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 Correlate with the surgical history. 3. Right maxillary sinusitis. 4. Small-volume fluid within the left mastoid air cells. Electronically Signed   By: Jackey Loge D.O.   On: 02/09/2023 12:55      Signature  -   Susa Raring M.D on 02/12/2023 at 9:14 AM   -  To page go to [email or web address]

## 2023-02-12 NOTE — Progress Notes (Signed)
LTM EEG discontinued - no skin breakdown at unhook. [facility] notified.  No visible skin breakdown. 

## 2023-02-13 ENCOUNTER — Inpatient Hospital Stay (HOSPITAL_COMMUNITY): Payer: Medicare HMO

## 2023-02-13 ENCOUNTER — Inpatient Hospital Stay: Payer: Medicare HMO | Admitting: Hematology and Oncology

## 2023-02-13 ENCOUNTER — Telehealth: Payer: Self-pay | Admitting: Radiation Oncology

## 2023-02-13 DIAGNOSIS — R569 Unspecified convulsions: Secondary | ICD-10-CM | POA: Diagnosis not present

## 2023-02-13 LAB — URINALYSIS, ROUTINE W REFLEX MICROSCOPIC
Bilirubin Urine: NEGATIVE
Glucose, UA: NEGATIVE mg/dL
Hgb urine dipstick: NEGATIVE
Ketones, ur: NEGATIVE mg/dL
Leukocytes,Ua: NEGATIVE
Nitrite: NEGATIVE
Protein, ur: NEGATIVE mg/dL
Specific Gravity, Urine: 1.009 (ref 1.005–1.030)
pH: 7 (ref 5.0–8.0)

## 2023-02-13 LAB — BASIC METABOLIC PANEL
Anion gap: 12 (ref 5–15)
BUN: <5 mg/dL — ABNORMAL LOW (ref 8–23)
CO2: 27 mmol/L (ref 22–32)
Calcium: 8.8 mg/dL — ABNORMAL LOW (ref 8.9–10.3)
Chloride: 99 mmol/L (ref 98–111)
Creatinine, Ser: 0.62 mg/dL (ref 0.44–1.00)
GFR, Estimated: >60 mL/min (ref >60)
Glucose, Bld: 117 mg/dL — ABNORMAL HIGH (ref 70–99)
Potassium: 3.2 mmol/L — ABNORMAL LOW (ref 3.5–5.1)
Sodium: 138 mmol/L (ref 135–145)

## 2023-02-13 LAB — CBC
HCT: 35.9 % — ABNORMAL LOW (ref 36.0–46.0)
Hemoglobin: 11.7 g/dL — ABNORMAL LOW (ref 12.0–15.0)
MCH: 33.1 pg (ref 26.0–34.0)
MCHC: 32.6 g/dL (ref 30.0–36.0)
MCV: 101.4 fL — ABNORMAL HIGH (ref 80.0–100.0)
Platelets: 99 10*3/uL — ABNORMAL LOW (ref 150–400)
RBC: 3.54 MIL/uL — ABNORMAL LOW (ref 3.87–5.11)
RDW: 12.3 % (ref 11.5–15.5)
WBC: 8.2 10*3/uL (ref 4.0–10.5)
nRBC: 0 % (ref 0.0–0.2)

## 2023-02-13 LAB — PROCALCITONIN: Procalcitonin: <0.1 ng/mL

## 2023-02-13 LAB — MAGNESIUM: Magnesium: 1.8 mg/dL (ref 1.7–2.4)

## 2023-02-13 LAB — GROUP A STREP BY PCR: Group A Strep by PCR: NOT DETECTED

## 2023-02-13 MED ORDER — CLOBAZAM 5 MG PO HALF TABLET
5.0000 mg | ORAL_TABLET | Freq: Every day | ORAL | Status: DC
  Administered 2023-02-13 – 2023-02-16 (×4): 5 mg via ORAL
  Filled 2023-02-13 (×4): qty 1

## 2023-02-13 MED ORDER — POTASSIUM CHLORIDE CRYS ER 20 MEQ PO TBCR
40.0000 meq | EXTENDED_RELEASE_TABLET | Freq: Two times a day (BID) | ORAL | Status: AC
  Administered 2023-02-13 (×2): 40 meq via ORAL
  Filled 2023-02-13 (×2): qty 2

## 2023-02-13 MED ORDER — IBUPROFEN 400 MG PO TABS
400.0000 mg | ORAL_TABLET | Freq: Once | ORAL | Status: AC
  Administered 2023-02-13: 400 mg via ORAL
  Filled 2023-02-13: qty 1

## 2023-02-13 MED ORDER — AMOXICILLIN-POT CLAVULANATE 875-125 MG PO TABS
1.0000 | ORAL_TABLET | Freq: Two times a day (BID) | ORAL | Status: DC
  Administered 2023-02-13 – 2023-02-16 (×8): 1 via ORAL
  Filled 2023-02-13 (×9): qty 1

## 2023-02-13 MED ORDER — CLOBAZAM 5 MG PO HALF TABLET: 5.0000 mg | ORAL_TABLET | Freq: Two times a day (BID) | ORAL | Status: DC

## 2023-02-13 NOTE — Telephone Encounter (Signed)
Called the daughter of the patient to reschedule consultation w. Dr. Roselind Messier. Daughter stated the patient is still currently in the hospital, would like a call back on 02/17/23.

## 2023-02-13 NOTE — Progress Notes (Signed)
PROGRESS NOTE                                                                                                                                                                                                             Patient Demographics:    [name], is a 67 y.o. female, DOB - Sep 06, 1955, QIO:[id number]  Outpatient Primary MD for the patient is [name], FNP    LOS - 3  Admit date - 02/09/2023    Chief Complaint  Patient presents with   Seizures       Brief Narrative (HPI from H&P)      67 y.o. female with medical history significant of thrombocytopenia, paroxysmal A-fib, hypertension, hyperlipidemia, GERD, DVT, chronic pain, COPD, anxiety, hemorrhagic stroke, stress incontinence, seizure disorder, neuropathy, breast cancer presenting after multiple seizures at home.  Orting to the family members patient was compliant with her Dilantin dose and was taking it on a daily basis however she did run out of Vimpat few days ago due to insurance problems, in the ER Dilantin levels were low, she was seen by neurology and admitted for ongoing seizures.   Subjective:   Patient in bed, no significant events as discussed with daughter at bedside, she remains mildly confused this morning per daughter, he had fever 103.1 this afternoon, she does endorse some sore throat   Assessment  & Plan :    Breakthrough seizures.   Ran out of Vimpat but was taking Dilantin as prescribed, Dilantin levels were still low, seen by neurology, head CT nonacute, EEG does not show any acute seizures but does show evidence of epileptogenicity and cortical dysfunction, had another seizure morning of 02/10/2023, currently on home dose of Dilantin, cannot increase the dose due to history of side effects with high-dose, continue Vimpat, seizures seems to have improved, defer further management to neurology clinically much better with daily improvement, UA  stable.  If continues to improve likely discharge home with home PT on 02/13/2023. -Discussed with the neurology, she continues to improve, no seizures noted on EEG, likely can DC LTM EEG tomorrow if she remains seizure-free -Meds management per neurology   Fever  - patient spiked temperature 103.1 this afternoon, x-ray with bronchitic changes, UA is negative, no leukocytosis, procalcitonin is pending, patient mother recent strep a pharyngitis where she was exposed, will  check strep a PCR, meanwhile we will start empirically on Augmentin  Mild chronic thrombocytopenia.  Stable.  Stress incontinence.  On oxybutynin  Chronic pain.  On tramadol.  Neuropathy.  Lyrica switched to Neurontin, will be discharged on Neurontin.  Hypokalemia.  Replaced      Condition - Extremely Guarded  Family Communication  : Discussed with daughter at bedside  Code Status :  Full  Consults  :  Neuro  PUD Prophylaxis :     Procedures  :     EEG - This study showed one seizure without clinical signs on 02/10/2023 at 1913  arising from  right posterior quadrant, lasting about 45 seconds. Additionally there was evidence of epileptogenicity and cortical dysfunction arising from right hemisphere, maximal right posterior quadrant.    Study has improved significantly compared to previous day.   EEG - This study showed evidence of epileptogenicity and cortical dysfunction arising from right hemisphere, maximal right posterior quadrant. This EEG pattern is on the ictal-interictal continuum with potential for seizures.  CT - 1.  No evidence of an acute intracranial abnormality. 2. 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 Correlate with the surgical history. 3. Right maxillary sinusitis. 4. Small-volume fluid within the left mastoid air cells.      Disposition Plan  :    Status is: Observation  DVT Prophylaxis  :     enoxaparin (LOVENOX) injection 40 mg Start: 02/09/23 1800    Lab Results  Component Value Date   PLT 99 (L) 02/13/2023    Diet :  Diet Order             Diet regular Fluid consistency: Thin  Diet effective now                    Inpatient Medications  Scheduled Meds:  Chlorhexidine Gluconate Cloth  6 each Topical Daily   cloBAZam  5 mg Oral Daily   vitamin B-12  2,000 mcg Oral Daily   enoxaparin (LOVENOX) injection  40 mg Subcutaneous Q24H   gabapentin  100 mg Oral TID   lacosamide  200 mg Oral BID   oxybutynin  5 mg Oral QHS   phenytoin  100 mg Oral QPM   phenytoin  130 mg Oral Daily   potassium chloride  40 mEq Oral BID   sodium chloride flush  10-40 mL Intracatheter Q12H   Continuous Infusions:   PRN Meds:.acetaminophen **OR** acetaminophen, haloperidol lactate, LORazepam, melatonin, polyethylene glycol  Antibiotics  :    Anti-infectives (From admission, onward)    None         Objective:   Vitals:   02/12/23 2330 02/13/23 0312 02/13/23 0800 02/13/23 1130  BP: (!) 100/58 (!) 111/53 111/64   Pulse: 88 95 (!) 106   Resp: 20 (!) 21 19   Temp: 98.1 F (36.7 C) 98 F (36.7 C) (!) 101 F (38.3 C) (!) 103.1 F (39.5 C)  TempSrc: Oral Oral Oral Oral  SpO2: 96% 92% 95%   Weight:        Wt Readings from Last 3 Encounters:  02/09/23 56.4 kg  01/12/23 58.4 kg  12/31/22 59.4 kg     Intake/Output Summary (Last 24 hours) at 02/13/2023 1356 Last data filed at 02/13/2023 0916 Gross per 24 hour  Intake 240 ml  Output --  Net 240 ml     Physical Exam  Awake Alert, Oriented X 3, she is mildly  confused, but overall appears to be more coherent and appears appropriate, frail, deconditioned Symmetrical Chest wall movement, Good air movement bilaterally, CTAB RRR,No Gallops,Rubs or new Murmurs, No Parasternal Heave +ve B.Sounds, Abd Soft, No tenderness, No rebound - guarding or rigidity. No Cyanosis, Clubbing or edema, No new Rash or bruise        Data Review:    Recent Labs  Lab 02/09/23 1128 02/10/23 0318 02/11/23 0202 02/12/23 0354 02/13/23 1310  WBC 5.4 5.4 5.9 6.1 8.2  HGB 12.9 12.0 12.3 11.9* 11.7*  HCT 39.0 36.9 36.3 35.8* 35.9*  PLT 118* 108* 111* 104* 99*  MCV 103.4* 102.8* 100.0 100.3* 101.4*  MCH 34.2* 33.4 33.9 33.3 33.1  MCHC 33.1 32.5 33.9 33.2 32.6  RDW 12.4 12.2 12.2 12.3 12.3  LYMPHSABS  --   --  1.2 0.9  --   MONOABS  --   --  0.5 0.5  --   EOSABS  --   --  0.0 0.0  --   BASOSABS  --   --  0.0 0.0  --     Recent Labs  Lab 02/09/23 1128 02/10/23 0318 02/10/23 0956 02/11/23 0202 02/12/23 0354 02/13/23 0406  NA 141 140  --  137 139 138  K 3.4* 3.0*  --  3.0* 3.2* 3.2*  CL 104 104  --  104 104 99  CO2 22 23  --  25 27 27   ANIONGAP 15 13  --  8 8 12   GLUCOSE 110* 85  --  137* 156* 117*  BUN 15 12  --  6* 5* <5*  CREATININE 1.08* 0.78  --  0.69 0.64 0.62  AST 23 17  --  16 16  --   ALT 15 12  --  14 13  --   ALKPHOS 51 47  --  45 51  --   BILITOT 0.7 1.0  --  0.5 0.4  --   ALBUMIN 3.8 3.4*  --  3.4* 3.3*  --   AMMONIA  --   --  33  --   --   --   BNP  --   --   --  38.5 22.6  --   MG  --   --   --  1.8 1.9 1.8  CALCIUM 9.0 8.5*  --  8.5* 8.7* 8.8*    Micro Results No results found for this or any previous visit (from the past 240 hour(s)).  Radiology Reports DG Chest Port 1 View  Result Date: 02/13/2023 CLINICAL DATA:  Fever EXAM: PORTABLE CHEST 1 VIEW COMPARISON:  02/09/2023 FINDINGS: PICC on the right with tip at the upper cavoatrial junction. Bilateral interstitial coarsening from likely from airway thickening. There is no edema, consolidation, effusion, or pneumothorax. Normal heart size and mediastinal contours. Remote right mid clavicle fracture with healing. IMPRESSION: Mild bronchitic markings.  No focal pneumonia. Electronically Signed   By: Tiburcio Pea M.D.   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 She presents with breakthrough seizures in the setting of medication noncompliance due to changes in insurance causing delay in receiving her  Vimpat.  EEG to evaluate for seizure  Level of alertness: Awake, asleep  AEDs during EEG study: LCM, PHT, GBP, Onfi  Technical aspects: This EEG study was done with scalp electrodes positioned according to the 10-20 International system of electrode placement. Electrical activity was reviewed with band pass filter of 1-70Hz , sensitivity of 7 uV/mm, display speed of 75mm/sec with a 60Hz  notched filter applied as appropriate. EEG data were recorded continuously and digitally stored.  Video monitoring was available and reviewed as appropriate.  Description: The posterior dominant rhythm consists of 8-9 Hz activity of moderate voltage (25-35 uV) seen predominantly in posterior head regions, symmetric and reactive to eye opening and eye closing. Sleep was characterized by sleep spindles (12-14hz ), maximal fronto-central region. EEG showed continuous polymorphic sharply contoured rhythmic 3 to 6 Hz theta-delta slowing in right hemisphere, maximal right posterior quadrant. Abundant sharp waves were noted in right posterior quadrant. Hyperventilation and photic stimulation were not performed.   ABNORMALITY - Sharp waves, right posterior quadrant - Continuous rhythmic slow, right hemisphere, maximal right posterior quadrant  IMPRESSION: This study showed evidence of epileptogenicity and cortical dysfunction arising from right hemisphere, maximal right posterior quadrant. No seizures were noted.  [name]  [place] EKG SITE RITE  Result Date:  02/10/2023 If Site Rite image not attached, placement could not be confirmed due to current cardiac rhythm.  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 She presents with breakthrough seizures in the setting of medication noncompliance due to changes in insurance causing delay in receiving her Vimpat.  EEG to evaluate for seizure  Level of alertness: Awake, asleep  AEDs during EEG study: LCM, PHT, GBP, Ativan  Technical aspects: This EEG study was done with scalp electrodes positioned according to the 10-20 International system of electrode placement. Electrical activity was reviewed with band pass filter of 1-70Hz , sensitivity of 7 uV/mm, display speed of 83mm/sec with a 60Hz  notched filter applied as appropriate. EEG data were recorded continuously and digitally stored.  Video monitoring was available and reviewed as appropriate.  Description: The posterior dominant rhythm consists of 8-9 Hz activity of moderate voltage (25-35 uV) seen predominantly in posterior head regions, symmetric and reactive to eye opening and eye closing. Sleep was characterized by sleep spindles (12-14hz ), maximal fronto-central region. EEG showed continuous polymorphic sharply contoured rhythmic 3 to 6 Hz theta-delta slowing in right hemisphere, maximal right posterior quadrant with waxing and waning morphology. Lateralized periodic discharges with overriding fast activity were noted in right hemisphere, maximal right posterior quadrant, at 1Hz . Independent left anterior temporal spikes were also noted. Hyperventilation and photic stimulation were not performed.    56 seizures were noted. No clinical signs were seen. EEG showed 3-5Hz  theta-delta slowing in right posterior quadrant which the involves into right frontotemporal region admixed with sharp waves at 3-4hz . EEG then showed high amplitude sharply contoured 12-13Hz  beta activity and involved left hemisphere. Subsequently EEG showed 2-3Hz  delta slowing. Average duration of seizures was 45 seconds to 1 minute. Last seizure was noted on 02/10/2023 at 1313.  ABNORMALITY - Seizure without clinical signs, right posterior quadrant - Lateralized periodic discharges with overriding fast activity, right  hemisphere, maximal right posterior quadrant ( LPD+F) - Spike, left anterior temporal - Continuous rhythmic slow, right hemisphere, maximal right posterior quadrant  IMPRESSION: This study showed 56 seizures without clinical signs arising from  right posterior quadrant, lasting about 45 seconds to 1 minute. Last seizure was noted on 02/10/2023 at 1313.  Additionally there was evidence of epileptogenicity and cortical dysfunction arising from right hemisphere, maximal right posterior quadrant. There was also independent epileptogenicity arising from left anterior temporal region. Dr. Otelia Limes was notified.  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 She presents with breakthrough seizures in the setting of medication noncompliance due to changes in insurance causing delay in receiving her Vimpat.  EEG to evaluate for seizure Level of alertness: Awake AEDs during EEG study: LCM, PHT, Ativan Technical aspects: This EEG study was done with scalp electrodes  positioned according to the 10-20 International system of electrode placement. Electrical activity was reviewed with band pass filter of 1-70Hz , sensitivity of 7 uV/mm, display speed of 75mm/sec with a 60Hz  notched filter applied as appropriate. EEG data were recorded continuously and digitally stored.  Video monitoring was available and reviewed as appropriate. Description: The posterior dominant rhythm consists of 8-9 Hz activity of moderate voltage (25-35 uV) seen predominantly in posterior head regions, symmetric and reactive to eye opening and eye closing. EEG showed continuous polymorphic sharply contoured rhythmic 3 to 6 Hz theta-delta slowing in right hemisphere, maximal right posterior quadrant with waxing and waning morphology. Abundant spike and waves were noted in right hemisphere, maximal right posterior quadrant, at times qasi- periodic at 1-2Hz . Frequently, the right hemispheric spikes also involved the left  frontal region. Hyperventilation and photic stimulation were not performed.   ABNORMALITY - Spike and waves, right hemisphere, maximal right posterior quadrant - Continuous rhythmic slow, right hemisphere, maximal right posterior quadrant IMPRESSION: This study showed evidence of epileptogenicity and cortical dysfunction arising from right hemisphere, maximal right posterior quadrant. This EEG pattern is on the ictal-interictal continuum with potential for seizures. No definite seizures were seen throughout the recording. Dr. Otelia Limes was notified. [name]      Signature  -   Mliss Fritz Jazmina Muhlenkamp M.D on 02/13/2023 at 1:56 PM   -  To page go to [email or web address]

## 2023-02-13 NOTE — Progress Notes (Signed)
Subjective: Daughter at bedside states patient's mental status is much clearer today.   Tmax 103.1  ROS: negative except above  Examination  Vital signs in last 24 hours: Temp:  [97.7 F (36.5 C)-103.1 F (39.5 C)] 103.1 F (39.5 C) (07/12 1130) Pulse Rate:  [88-106] 106 (07/12 0800) Resp:  [19-22] 19 (07/12 0800) BP: (100-129)/(53-94) 111/64 (07/12 0800) SpO2:  [92 %-96 %] 95 % (07/12 0800)  General: lying in bed, NAD Neuro: MS: Alert, oriented to place and person but not to time, follows commands\ CN: pupils equal and reactive,  EOMI, face symmetric, tongue midline, normal sensation over face, Motor: 5/5 strength in all 4 extremities Reflexes: 2+ bilaterally over patella, biceps, plantars: flexor Coordination: normal Gait: not tested  Basic Metabolic Panel: Recent Labs  Lab 02/09/23 1128 02/10/23 0318 02/11/23 0202 02/12/23 0354 02/13/23 0406  NA 141 140 137 139 138  K 3.4* 3.0* 3.0* 3.2* 3.2*  CL 104 104 104 104 99  CO2 22 23 25 27 27   GLUCOSE 110* 85 137* 156* 117*  BUN 15 12 6* 5* <5*  CREATININE 1.08* 0.78 0.69 0.64 0.62  CALCIUM 9.0 8.5* 8.5* 8.7* 8.8*  MG  --   --  1.8 1.9 1.8    CBC: Recent Labs  Lab 02/09/23 1128 02/10/23 0318 02/11/23 0202 02/12/23 0354  WBC 5.4 5.4 5.9 6.1  NEUTROABS  --   --  4.1 4.6  HGB 12.9 12.0 12.3 11.9*  HCT 39.0 36.9 36.3 35.8*  MCV 103.4* 102.8* 100.0 100.3*  PLT 118* 108* 111* 104*     Coagulation Studies: No results for input(s): "LABPROT", "INR" in the last 72 hours.  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 She presents with breakthrough seizures in the setting of medication noncompliance due to changes in insurance causing delay in receiving her Vimpat.     Epilepsy with breakthrough seizure Fever - Most likely etiology for her breakthrough  seizures is missing vimpat - Does appear to have some tangential thoughts today, possible delirium vs due to keppra    Recommendations: - Continue home dosing regimen of Vimpat 200mg  bid + dilantin 130mg  in AM and 100mg  in PM (see Dr. Rosalyn Gess note from May 2024, dilantin toxicity has been an issue so regardless of seizure recurrence, DO NOT recommend increasing dilantin even in presence of subtherapeutic levels)  - Neurontin 100 mg TID was added this admission -Switched from Keppra to Onfi 5 mg nightly.  Recommend continuing at discharge -Continue LTM EEG overnight, can DC tomorrow if no seizures -Follow-up with Dr. Karel Jarvis in 2 to 3 months -Management of fever per primary team - PRN IV ativan for clinical seizure - Continue seizure precautions  Seizure precautions: Per [facility] statutes, patients with seizures are not allowed to drive until they have been seizure-free for six months and cleared by a physician    Use caution when using heavy equipment or power tools. Avoid working on ladders or at heights. Take showers instead of baths. Ensure the water temperature is not too high on the home water heater. Do not go swimming alone. Do not lock yourself in a room alone (i.e. bathroom). When caring for infants or small children, sit down when holding, feeding, or changing them to minimize risk of injury to the child in the event you have a seizure. Maintain good sleep hygiene.  Avoid alcohol.    If patient has another seizure, call 911 and bring them back to the ED if: A.  The seizure lasts longer than 5 minutes.      B.  The patient doesn't wake shortly after the seizure or has new problems such as difficulty seeing, speaking or moving following the seizure C.  The patient was injured during the seizure D.  The patient has a temperature over 102 F (39C) E.  The patient vomited during the seizure and now is having trouble breathing    During the Seizure   - First, ensure adequate  ventilation and place patients on the floor on their left side  Loosen clothing around the neck and ensure the airway is patent. If the patient is clenching the teeth, do not force the mouth open with any object as this can cause severe damage - Remove all items from the surrounding that can be hazardous. The patient may be oblivious to what's happening and may not even know what he or she is doing. If the patient is confused and wandering, either gently guide him/her away and block access to outside areas - Reassure the individual and be comforting - Call 911. In most cases, the seizure ends before EMS arrives. However, there are cases when seizures may last over 3 to 5 minutes. Or the individual may have developed breathing difficulties or severe injuries. If a pregnant patient or a person with diabetes develops a seizure, it is prudent to call an ambulance. - Finally, if the patient does not regain full consciousness, then call EMS. Most patients will remain confused for about 45 to 90 minutes after a seizure, so you must use judgment in calling for help. - Avoid restraints but make sure the patient is in a bed with padded side rails - Place the individual in a lateral position with the neck slightly flexed; this will help the saliva drain from the mouth and prevent the tongue from falling backward - Remove all nearby furniture and other hazards from the area - Provide verbal assurance as the individual is regaining consciousness - Provide the patient with privacy if possible - Call for help and start treatment as ordered by the caregiver    After the Seizure (Postictal Stage)   After a seizure, most patients experience confusion, fatigue, muscle pain and/or a headache. Thus, one should permit the individual to sleep. For the next few days, reassurance is essential. Being calm and helping reorient the person is also of importance.   Most seizures are painless and end spontaneously. Seizures are  not harmful to others but can lead to complications such as stress on the lungs, brain and the heart. Individuals with prior lung problems may develop labored breathing and respiratory distress.      I have spent a total of   40 minutes with the patient reviewing hospital notes,  test results, labs and examining the patient as well as establishing an assessment and plan that was discussed personally with the patient.  > 50% of time was spent in direct patient care.         Lindie Spruce Epilepsy [facility] For questions after 5pm please refer to AMION to reach the Neurologist on call

## 2023-02-13 NOTE — Plan of Care (Signed)

## 2023-02-13 NOTE — Progress Notes (Signed)
vLTM maintenance  All impedances below 10kohms  No skin breakdown noted at A1 A2 FZ  CZ PZ

## 2023-02-13 NOTE — Procedures (Signed)
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 She presents with breakthrough seizures in the setting of medication noncompliance due to changes in insurance causing delay in receiving her Vimpat.  EEG to evaluate for seizure   Level of alertness: Awake, asleep   AEDs during EEG study: LCM, PHT, GBP, Onfi   Technical aspects: This EEG study was done with scalp electrodes positioned according to the 10-20 International system of electrode placement. Electrical activity was reviewed with band pass filter of 1-70Hz , sensitivity of 7 uV/mm, display speed of 82mm/sec with a 60Hz  notched filter applied as appropriate. EEG data were recorded continuously and digitally stored.  Video monitoring was available and reviewed as appropriate.   Description: The posterior dominant rhythm consists of 8-9 Hz activity of moderate voltage (25-35 uV) seen predominantly in posterior head regions, symmetric and reactive to eye opening and eye closing. Sleep was characterized by sleep spindles (12-14hz ), maximal fronto-central region. EEG showed continuous polymorphic sharply contoured rhythmic 3 to 6 Hz theta-delta slowing in right hemisphere, maximal right posterior quadrant. Abundant sharp waves were noted in right posterior quadrant. Hyperventilation and photic stimulation were not performed.     ABNORMALITY - Sharp waves, right posterior quadrant - Continuous rhythmic slow, right hemisphere, maximal right posterior quadrant   IMPRESSION: This study showed evidence of epileptogenicity and cortical dysfunction arising from right hemisphere, maximal right posterior quadrant. No seizures were noted.   [name]

## 2023-02-14 ENCOUNTER — Other Ambulatory Visit (HOSPITAL_COMMUNITY): Payer: Self-pay

## 2023-02-14 DIAGNOSIS — R569 Unspecified convulsions: Secondary | ICD-10-CM | POA: Diagnosis not present

## 2023-02-14 LAB — CBC WITH DIFFERENTIAL/PLATELET
Abs Immature Granulocytes: 0.04 10*3/uL (ref 0.00–0.07)
Basophils Absolute: 0 10*3/uL (ref 0.0–0.1)
Basophils Relative: 0 %
Eosinophils Absolute: 0.1 10*3/uL (ref 0.0–0.5)
Eosinophils Relative: 1 %
HCT: 33.2 % — ABNORMAL LOW (ref 36.0–46.0)
Hemoglobin: 11 g/dL — ABNORMAL LOW (ref 12.0–15.0)
Immature Granulocytes: 1 %
Lymphocytes Relative: 18 %
Lymphs Abs: 1.2 10*3/uL (ref 0.7–4.0)
MCH: 34.5 pg — ABNORMAL HIGH (ref 26.0–34.0)
MCHC: 33.1 g/dL (ref 30.0–36.0)
MCV: 104.1 fL — ABNORMAL HIGH (ref 80.0–100.0)
Monocytes Absolute: 0.6 10*3/uL (ref 0.1–1.0)
Monocytes Relative: 9 %
Neutro Abs: 4.8 10*3/uL (ref 1.7–7.7)
Neutrophils Relative %: 71 %
Platelets: 93 10*3/uL — ABNORMAL LOW (ref 150–400)
RBC: 3.19 MIL/uL — ABNORMAL LOW (ref 3.87–5.11)
RDW: 12.4 % (ref 11.5–15.5)
WBC: 6.8 10*3/uL (ref 4.0–10.5)
nRBC: 0 % (ref 0.0–0.2)

## 2023-02-14 LAB — BASIC METABOLIC PANEL
Anion gap: 7 (ref 5–15)
BUN: 10 mg/dL (ref 8–23)
CO2: 26 mmol/L (ref 22–32)
Calcium: 8.5 mg/dL — ABNORMAL LOW (ref 8.9–10.3)
Chloride: 103 mmol/L (ref 98–111)
Creatinine, Ser: 0.71 mg/dL (ref 0.44–1.00)
GFR, Estimated: >60 mL/min (ref >60)
Glucose, Bld: 108 mg/dL — ABNORMAL HIGH (ref 70–99)
Potassium: 4.3 mmol/L (ref 3.5–5.1)
Sodium: 136 mmol/L (ref 135–145)

## 2023-02-14 MED ORDER — GABAPENTIN 100 MG PO CAPS
100.0000 mg | ORAL_CAPSULE | Freq: Three times a day (TID) | ORAL | 0 refills | Status: DC
  Filled 2023-02-14 – 2023-02-26 (×2): qty 90, 30d supply, fill #0

## 2023-02-14 MED ORDER — IBUPROFEN 600 MG PO TABS
600.0000 mg | ORAL_TABLET | Freq: Two times a day (BID) | ORAL | Status: DC | PRN
  Administered 2023-02-15 – 2023-02-18 (×4): 600 mg via ORAL
  Filled 2023-02-14 (×4): qty 1

## 2023-02-14 MED ORDER — CLOBAZAM 10 MG PO TABS
5.0000 mg | ORAL_TABLET | Freq: Every day | ORAL | 0 refills | Status: DC
Start: 2023-02-14 — End: 2023-02-14
  Filled 2023-02-14: qty 30, 60d supply, fill #0

## 2023-02-14 MED ORDER — HYDROCODONE-ACETAMINOPHEN 5-325 MG PO TABS
1.0000 | ORAL_TABLET | Freq: Once | ORAL | Status: AC | PRN
  Administered 2023-02-14: 1 via ORAL
  Filled 2023-02-14: qty 1

## 2023-02-14 MED ORDER — CLOBAZAM 10 MG PO TABS: 5.0000 mg | ORAL_TABLET | Freq: Every day | ORAL | 0 refills | Status: DC

## 2023-02-14 NOTE — Progress Notes (Signed)
PROGRESS NOTE                                                                                                                                                                                                             Patient Demographics:    [name], is a 67 y.o. female, DOB - Dec 02, 1955, ZOX:[id number]  Outpatient Primary MD for the patient is [name], FNP    LOS - 4  Admit date - 02/09/2023    Chief Complaint  Patient presents with   Seizures       Brief Narrative (HPI from H&P)      67 y.o. female with medical history significant of thrombocytopenia, paroxysmal A-fib, hypertension, hyperlipidemia, GERD, DVT, chronic pain, COPD, anxiety, hemorrhagic stroke, stress incontinence, seizure disorder, neuropathy, breast cancer presenting after multiple seizures at home.  Orting to the family members patient was compliant with her Dilantin dose and was taking it on a daily basis however she did run out of Vimpat few days ago due to insurance problems, in the ER Dilantin levels were low, she was seen by neurology and admitted for ongoing seizures.   Subjective:   Patient in bed, no significant events as discussed with daughter at bedside, she remains mildly confused this morning per daughter, he had fever 103.1 this afternoon, she does endorse some sore throat   Assessment  & Plan :    Breakthrough seizures.   Ran out of Vimpat but was taking Dilantin as prescribed, Dilantin levels were still low, seen by neurology, head CT nonacute, EEG does not show any acute seizures but does show evidence of epileptogenicity and cortical dysfunction, had another seizure morning of 02/10/2023, currently on home dose of Dilantin, cannot increase the dose due to history of side effects with high-dose, continue Vimpat, seizures seems to have improved, defer further management to neurology clinically much better with daily improvement, UA  stable.  If continues to improve likely discharge home with home PT on 02/13/2023. -Discussed with the neurology, she continues to improve, no seizures noted on EEG, likely can DC LTM EEG tomorrow if she remains seizure-free, Meds management per neurology   Fever  - patient spiked temperature 103.1 this afternoon, x-ray with bronchitic changes, UA is negative, no leukocytosis, procalcitonin is pending, negative strep a pharyngitis, on Augmentin for bronchitis and improving if  stable likely discharge home on 02/15/2023.  Discussed with family in detail.  Mild chronic thrombocytopenia.  Stable.  Stress incontinence.  On oxybutynin  Chronic pain.  On tramadol.  Neuropathy.  Lyrica switched to Neurontin, will be discharged on Neurontin.  Hypokalemia.  Replaced      Condition - Extremely Guarded  Family Communication  :   Daughter French Ana [phone] - 02/10/23, 02/14/23   Updated son Sharia Reeve [phone]  on 02/11/2023 and 02/12/2023, 02/14/23   Code Status :  Full  Consults  :  Neuro  PUD Prophylaxis :     Procedures  :     EEG - This study showed one seizure without clinical signs on 02/10/2023 at 1913  arising from  right posterior quadrant, lasting about 45 seconds. Additionally there was evidence of epileptogenicity and cortical dysfunction arising from right hemisphere, maximal right posterior quadrant.    Study has improved significantly compared to previous day.   EEG - This study showed evidence of epileptogenicity and cortical dysfunction arising from right hemisphere, maximal right posterior quadrant. This EEG pattern is on the ictal-interictal continuum with potential for seizures.  CT - 1.  No evidence of an acute intracranial abnormality. 2. 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 Correlate with the surgical history. 3. Right maxillary sinusitis. 4. Small-volume fluid within the  left mastoid air cells.      Disposition Plan  :    Status is: inpt  DVT Prophylaxis  :    enoxaparin (LOVENOX) injection 40 mg Start: 02/09/23 1800    Lab Results  Component Value Date   PLT 93 (L) 02/14/2023    Diet :  Diet Order             Diet regular Fluid consistency: Thin  Diet effective now                    Inpatient Medications  Scheduled Meds:  amoxicillin-clavulanate  1 tablet Oral Q12H   Chlorhexidine Gluconate Cloth  6 each Topical Daily   cloBAZam  5 mg Oral Daily   vitamin B-12  2,000 mcg Oral Daily   enoxaparin (LOVENOX) injection  40 mg Subcutaneous Q24H   gabapentin  100 mg Oral TID   lacosamide  200 mg Oral BID   oxybutynin  5 mg Oral QHS   phenytoin  100 mg Oral QPM   phenytoin  130 mg Oral Daily   Continuous Infusions:   PRN Meds:.acetaminophen **OR** acetaminophen, haloperidol lactate, LORazepam, melatonin, polyethylene glycol  Antibiotics  :    Anti-infectives (From admission, onward)    Start     Dose/Rate Route Frequency Ordered Stop   02/13/23 1500  amoxicillin-clavulanate (AUGMENTIN) 875-125 MG per tablet 1 tablet        1 tablet Oral Every 12 hours 02/13/23 1404           Objective:   Vitals:   02/13/23 2302 02/13/23 2330 02/14/23 0051 02/14/23 0409  BP: (!) 107/59  (!) 97/55 94/63  Pulse: 93  78 84  Resp: 20  20 20   Temp: 98 F (36.7 C)   97.9 F (36.6 C)  TempSrc: Oral   Oral  SpO2: 93% 92% 92% 94%  Weight:        Wt Readings from Last 3 Encounters:  02/09/23 56.4 kg  01/12/23 58.4 kg  12/31/22 59.4 kg     Intake/Output Summary (Last 24 hours) at 02/14/2023 0828 Last  data filed at 02/14/2023 0537 Gross per 24 hour  Intake 480 ml  Output 1600 ml  Net -1120 ml     Physical Exam  Awake Alert, No new F.N deficits, Normal affect .AT,PERRAL Supple Neck, No JVD,   Symmetrical Chest wall movement, Good air movement bilaterally, CTAB RRR,No Gallops, Rubs or new Murmurs,  +ve B.Sounds, Abd Soft,  No tenderness,   No Cyanosis, Clubbing or edema       Data Review:    Recent Labs  Lab 02/10/23 0318 02/11/23 0202 02/12/23 0354 02/13/23 1310 02/14/23 0155  WBC 5.4 5.9 6.1 8.2 6.8  HGB 12.0 12.3 11.9* 11.7* 11.0*  HCT 36.9 36.3 35.8* 35.9* 33.2*  PLT 108* 111* 104* 99* 93*  MCV 102.8* 100.0 100.3* 101.4* 104.1*  MCH 33.4 33.9 33.3 33.1 34.5*  MCHC 32.5 33.9 33.2 32.6 33.1  RDW 12.2 12.2 12.3 12.3 12.4  LYMPHSABS  --  1.2 0.9  --  1.2  MONOABS  --  0.5 0.5  --  0.6  EOSABS  --  0.0 0.0  --  0.1  BASOSABS  --  0.0 0.0  --  0.0    Recent Labs  Lab 02/09/23 1128 02/10/23 0318 02/10/23 0956 02/11/23 0202 02/12/23 0354 02/13/23 0406 02/13/23 1310 02/14/23 0155  NA 141 140  --  137 139 138  --  136  K 3.4* 3.0*  --  3.0* 3.2* 3.2*  --  4.3  CL 104 104  --  104 104 99  --  103  CO2 22 23  --  25 27 27   --  26  ANIONGAP 15 13  --  8 8 12   --  7  GLUCOSE 110* 85  --  137* 156* 117*  --  108*  BUN 15 12  --  6* 5* <5*  --  10  CREATININE 1.08* 0.78  --  0.69 0.64 0.62  --  0.71  AST 23 17  --  16 16  --   --   --   ALT 15 12  --  14 13  --   --   --   ALKPHOS 51 47  --  45 51  --   --   --   BILITOT 0.7 1.0  --  0.5 0.4  --   --   --   ALBUMIN 3.8 3.4*  --  3.4* 3.3*  --   --   --   PROCALCITON  --   --   --   --   --   --  <0.10  --   AMMONIA  --   --  33  --   --   --   --   --   BNP  --   --   --  38.5 22.6  --   --   --   MG  --   --   --  1.8 1.9 1.8  --   --   CALCIUM 9.0 8.5*  --  8.5* 8.7* 8.8*  --  8.5*    Micro Results Recent Results (from the past 240 hour(s))  Group A Strep by PCR     Status: None   Collection Time: 02/13/23  3:54 PM   Specimen: Throat; Sterile Swab  Result Value Ref Range Status   Group A Strep by PCR NOT DETECTED NOT DETECTED Final    Comment: Performed at [facility], [address] [place], [place] [address]  Radiology Reports DG Chest Port 1 View  Result Date: 02/13/2023 CLINICAL DATA:  Fever EXAM: PORTABLE  CHEST 1 VIEW COMPARISON:  02/09/2023 FINDINGS: PICC on the right with tip at the upper cavoatrial junction. Bilateral interstitial coarsening from likely from airway thickening. There is no edema, consolidation, effusion, or pneumothorax. Normal heart size and mediastinal contours. Remote right mid clavicle fracture with healing. IMPRESSION: Mild bronchitic markings.  No focal pneumonia. Electronically Signed   By: Tiburcio Pea M.D.   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 She presents with breakthrough seizures in the setting of medication noncompliance due to changes in insurance causing delay in receiving her Vimpat.  EEG to evaluate for seizure  Level of alertness: Awake, asleep  AEDs during EEG study: LCM, PHT, GBP, Onfi  Technical aspects: This EEG study was done with scalp electrodes positioned according to the 10-20 International system of electrode placement. Electrical activity was reviewed with band pass filter of 1-70Hz , sensitivity of 7 uV/mm, display speed of 72mm/sec with a 60Hz  notched filter applied as appropriate. EEG data were recorded continuously and digitally stored.  Video monitoring was available and reviewed as appropriate.  Description: The posterior dominant rhythm consists of 8-9 Hz activity of moderate voltage (25-35 uV) seen predominantly in posterior head regions, symmetric and reactive to eye opening and eye closing. Sleep was characterized by sleep spindles (12-14hz ), maximal fronto-central region. EEG showed continuous polymorphic sharply contoured rhythmic 3 to 6 Hz  theta-delta slowing in right hemisphere, maximal right posterior quadrant. Abundant sharp waves were noted in right posterior quadrant. Hyperventilation and photic stimulation were not performed.   ABNORMALITY - Sharp waves, right posterior quadrant - Continuous rhythmic slow, right hemisphere, maximal right posterior quadrant  IMPRESSION: This study showed evidence of epileptogenicity and cortical dysfunction arising from right hemisphere, maximal right posterior quadrant. No seizures were noted.  Charlsie Quest  [place] EKG SITE RITE  Result Date: 02/10/2023 If Site Rite image not attached, placement could not be confirmed due to current cardiac rhythm.  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 She presents with breakthrough seizures in the setting of medication noncompliance due to changes in insurance causing delay in receiving her Vimpat.  EEG to evaluate for seizure  Level of alertness: Awake, asleep  AEDs during EEG study: LCM, PHT, GBP, Ativan  Technical aspects: This EEG study was done with scalp electrodes positioned according to the 10-20 International system of electrode placement. Electrical activity was reviewed with band pass filter of 1-70Hz , sensitivity of 7 uV/mm, display speed of 23mm/sec with a 60Hz  notched filter applied as appropriate. EEG data were recorded continuously and digitally stored.  Video monitoring was available and reviewed as appropriate.  Description: The posterior dominant rhythm consists of 8-9 Hz activity of moderate  voltage (25-35 uV) seen predominantly in posterior head regions, symmetric and  reactive to eye opening and eye closing. Sleep was characterized by sleep spindles (12-14hz ), maximal fronto-central region. EEG showed continuous polymorphic sharply contoured rhythmic 3 to 6 Hz theta-delta slowing in right hemisphere, maximal right posterior quadrant with waxing and waning morphology. Lateralized periodic discharges with overriding fast activity were noted in right hemisphere, maximal right posterior quadrant, at 1Hz . Independent left anterior temporal spikes were also noted. Hyperventilation and photic stimulation were not performed.   56 seizures were noted. No clinical signs were seen. EEG showed 3-5Hz  theta-delta slowing in right posterior quadrant which the involves into right frontotemporal region admixed with sharp waves at 3-4hz . EEG then showed high amplitude sharply contoured 12-13Hz  beta activity and involved left hemisphere. Subsequently EEG showed 2-3Hz  delta slowing. Average duration of seizures was 45 seconds to 1 minute. Last seizure was noted on 02/10/2023 at 1313.  ABNORMALITY - Seizure without clinical signs, right posterior quadrant - Lateralized periodic discharges with overriding fast activity, right hemisphere, maximal right posterior quadrant ( LPD+F) - Spike, left anterior temporal - Continuous rhythmic slow, right hemisphere, maximal right posterior quadrant  IMPRESSION: This study showed 56 seizures without clinical signs arising from  right posterior quadrant, lasting about 45 seconds to 1 minute. Last seizure was noted on 02/10/2023 at 1313.  Additionally there was evidence of epileptogenicity and cortical dysfunction arising from right hemisphere, maximal right posterior quadrant. There was also independent epileptogenicity arising from left anterior temporal region. Dr. Otelia Limes was notified.  Charlsie Quest      Signature  -   Susa Raring M.D on 02/14/2023 at 8:28 AM   -  To page go to [email or web address]

## 2023-02-14 NOTE — Plan of Care (Signed)

## 2023-02-14 NOTE — Progress Notes (Signed)
TRH night cross cover note:   I was notified by RN that this patient is experiencing some back discomfort that is refractory to prn acetaminophen. I subsequently ordered Norco 5/325 mg p.o. x 1 dose prn.      Newton Pigg, DO Hospitalist

## 2023-02-14 NOTE — Progress Notes (Signed)
Mobility Specialist Progress Note    02/14/23 1035  Mobility  Activity Ambulated with assistance in hallway  Level of Assistance Minimal assist, patient does 75% or more  Assistive Device Front wheel walker  Distance Ambulated (ft) 50 ft  Activity Response Tolerated well  Mobility Referral Yes  $Mobility charge 1 Mobility  Mobility Specialist Start Time (ACUTE ONLY) 1015  Mobility Specialist Stop Time (ACUTE ONLY) 1035  Mobility Specialist Time Calculation (min) (ACUTE ONLY) 20 min   Pre-Mobility: 92 HR During Mobility: 108 HR Post-Mobility: 102 HR  Pt received in bed and agreeable. C/o back pain. Pt completed x2 STS to change soiled gown. C/o it tiring her out. Returned to chair and left with call bell in reach and chair alarm on. RN aware.   [place] Mobility Specialist  Please Neurosurgeon or Rehab Office at [phone]

## 2023-02-14 NOTE — Plan of Care (Signed)

## 2023-02-14 NOTE — Procedures (Signed)
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 She presents with breakthrough seizures in the setting of medication noncompliance due to changes in insurance causing delay in receiving her Vimpat.  EEG to evaluate for seizure   Level of alertness: Awake, asleep   AEDs during EEG study: LCM, PHT, GBP, Onfi   Technical aspects: This EEG study was done with scalp electrodes positioned according to the 10-20 International system of electrode placement. Electrical activity was reviewed with band pass filter of 1-70Hz , sensitivity of 7 uV/mm, display speed of 36mm/sec with a 60Hz  notched filter applied as appropriate. EEG data were recorded continuously and digitally stored.  Video monitoring was available and reviewed as appropriate.   Description: The posterior dominant rhythm consists of 8-9 Hz activity of moderate voltage (25-35 uV) seen predominantly in posterior head regions, symmetric and reactive to eye opening and eye closing. Sleep was characterized by sleep spindles (12-14hz ), maximal fronto-central region. EEG showed continuous polymorphic sharply contoured rhythmic 3 to 6 Hz theta-delta slowing in right hemisphere, maximal right posterior quadrant. Abundant sharp waves were noted in right posterior quadrant. Hyperventilation and photic stimulation were not performed.      ABNORMALITY - Sharp waves, right posterior quadrant - Continuous rhythmic slow, right hemisphere, maximal right posterior quadrant   IMPRESSION: This study showed evidence of epileptogenicity and cortical dysfunction arising from right hemisphere, maximal right posterior quadrant. No seizures were noted.   Mendy Chou [name]

## 2023-02-14 NOTE — Progress Notes (Signed)
LTM maint complete - no skin breakdown seen  Serviced P3 P4 [facility] monitored, Event button test confirmed by [facility].

## 2023-02-15 ENCOUNTER — Inpatient Hospital Stay (HOSPITAL_COMMUNITY): Payer: Medicare HMO

## 2023-02-15 ENCOUNTER — Other Ambulatory Visit (HOSPITAL_COMMUNITY): Payer: Medicare HMO

## 2023-02-15 DIAGNOSIS — M7989 Other specified soft tissue disorders: Secondary | ICD-10-CM

## 2023-02-15 DIAGNOSIS — Z91148 Patient's other noncompliance with medication regimen for other reason: Secondary | ICD-10-CM | POA: Diagnosis not present

## 2023-02-15 DIAGNOSIS — R569 Unspecified convulsions: Secondary | ICD-10-CM | POA: Diagnosis not present

## 2023-02-15 LAB — COMPREHENSIVE METABOLIC PANEL
ALT: 15 U/L (ref 0–44)
AST: 18 U/L (ref 15–41)
Albumin: 2.6 g/dL — ABNORMAL LOW (ref 3.5–5.0)
Alkaline Phosphatase: 48 U/L (ref 38–126)
Anion gap: 16 — ABNORMAL HIGH (ref 5–15)
BUN: 10 mg/dL (ref 8–23)
CO2: 22 mmol/L (ref 22–32)
Calcium: 8.6 mg/dL — ABNORMAL LOW (ref 8.9–10.3)
Chloride: 96 mmol/L — ABNORMAL LOW (ref 98–111)
Creatinine, Ser: 0.76 mg/dL (ref 0.44–1.00)
GFR, Estimated: >60 mL/min (ref >60)
Glucose, Bld: 125 mg/dL — ABNORMAL HIGH (ref 70–99)
Potassium: 3.3 mmol/L — ABNORMAL LOW (ref 3.5–5.1)
Sodium: 134 mmol/L — ABNORMAL LOW (ref 135–145)
Total Bilirubin: 0.4 mg/dL (ref 0.3–1.2)
Total Protein: 5.6 g/dL — ABNORMAL LOW (ref 6.5–8.1)

## 2023-02-15 LAB — CBC
HCT: 33.2 % — ABNORMAL LOW (ref 36.0–46.0)
Hemoglobin: 11.1 g/dL — ABNORMAL LOW (ref 12.0–15.0)
MCH: 33.3 pg (ref 26.0–34.0)
MCHC: 33.4 g/dL (ref 30.0–36.0)
MCV: 99.7 fL (ref 80.0–100.0)
Platelets: 109 10*3/uL — ABNORMAL LOW (ref 150–400)
RBC: 3.33 MIL/uL — ABNORMAL LOW (ref 3.87–5.11)
RDW: 12.1 % (ref 11.5–15.5)
WBC: 6.6 10*3/uL (ref 4.0–10.5)
nRBC: 0 % (ref 0.0–0.2)

## 2023-02-15 LAB — TSH: TSH: 1.576 u[IU]/mL (ref 0.350–4.500)

## 2023-02-15 LAB — C-REACTIVE PROTEIN: CRP: 20.9 mg/dL — ABNORMAL HIGH (ref <1.0)

## 2023-02-15 LAB — PROCALCITONIN: Procalcitonin: 0.15 ng/mL

## 2023-02-15 LAB — MAGNESIUM: Magnesium: 1.7 mg/dL (ref 1.7–2.4)

## 2023-02-15 MED ORDER — LACTATED RINGERS IV SOLN: INTRAVENOUS | Status: AC

## 2023-02-15 MED ORDER — DILTIAZEM HCL-DEXTROSE 125-5 MG/125ML-% IV SOLN (PREMIX)
5.0000 mg/h | INTRAVENOUS | Status: DC
  Administered 2023-02-15: 5 mg/h via INTRAVENOUS
  Filled 2023-02-15: qty 125

## 2023-02-15 MED ORDER — LORAZEPAM 2 MG/ML IJ SOLN: 1.0000 mg | Freq: Once | INTRAMUSCULAR | Status: DC | PRN

## 2023-02-15 MED ORDER — TRAMADOL HCL 50 MG PO TABS
100.0000 mg | ORAL_TABLET | Freq: Four times a day (QID) | ORAL | Status: DC | PRN
  Administered 2023-02-15 – 2023-02-18 (×7): 100 mg via ORAL
  Filled 2023-02-15 (×7): qty 2

## 2023-02-15 MED ORDER — KETOROLAC TROMETHAMINE 15 MG/ML IJ SOLN
15.0000 mg | Freq: Once | INTRAMUSCULAR | Status: AC
  Administered 2023-02-15: 15 mg via INTRAVENOUS
  Filled 2023-02-15: qty 1

## 2023-02-15 MED ORDER — LORAZEPAM 2 MG/ML IJ SOLN: 1.0000 mg | Freq: Four times a day (QID) | INTRAMUSCULAR | Status: DC | PRN

## 2023-02-15 MED ORDER — POTASSIUM CHLORIDE CRYS ER 20 MEQ PO TBCR
40.0000 meq | EXTENDED_RELEASE_TABLET | Freq: Once | ORAL | Status: AC
  Administered 2023-02-15: 40 meq via ORAL
  Filled 2023-02-15: qty 2

## 2023-02-15 MED ORDER — DILTIAZEM HCL 25 MG/5ML IV SOLN
10.0000 mg | Freq: Four times a day (QID) | INTRAVENOUS | Status: DC | PRN
  Administered 2023-02-15: 10 mg via INTRAVENOUS
  Filled 2023-02-15: qty 5

## 2023-02-15 MED ORDER — LACTATED RINGERS IV SOLN
INTRAVENOUS | Status: DC
Start: 2023-02-15 — End: 2023-02-15

## 2023-02-15 MED ORDER — DILTIAZEM LOAD VIA INFUSION
10.0000 mg | Freq: Once | INTRAVENOUS | Status: AC
  Administered 2023-02-15: 10 mg via INTRAVENOUS
  Filled 2023-02-15: qty 10

## 2023-02-15 MED ORDER — MAGNESIUM SULFATE IN D5W 1-5 GM/100ML-% IV SOLN
1.0000 g | Freq: Once | INTRAVENOUS | Status: AC
  Administered 2023-02-15: 1 g via INTRAVENOUS
  Filled 2023-02-15 (×2): qty 100

## 2023-02-15 MED ORDER — IOHEXOL 350 MG/ML SOLN
75.0000 mL | Freq: Once | INTRAVENOUS | Status: AC | PRN
  Administered 2023-02-15: 75 mL via INTRAVENOUS

## 2023-02-15 MED ORDER — ACETAMINOPHEN 500 MG PO TABS: 1000.0000 mg | ORAL_TABLET | Freq: Once | ORAL | Status: DC

## 2023-02-15 MED ORDER — DILTIAZEM HCL 30 MG PO TABS
30.0000 mg | ORAL_TABLET | Freq: Three times a day (TID) | ORAL | Status: DC
  Administered 2023-02-15 – 2023-02-16 (×3): 30 mg via ORAL
  Filled 2023-02-15 (×7): qty 1

## 2023-02-15 MED ORDER — IOHEXOL 9 MG/ML PO SOLN
500.0000 mL | ORAL | Status: AC
  Administered 2023-02-15 (×2): 500 mL via ORAL

## 2023-02-15 NOTE — Plan of Care (Signed)
  Problem: Education: Goal: Expressions of having a comfortable level of knowledge regarding the disease process will increase Outcome: Progressing   Problem: Coping: Goal: Ability to adjust to condition or change in health will improve Outcome: Progressing Goal: Ability to identify appropriate support needs will improve Outcome: Progressing   Problem: Education: Goal: Knowledge of General Education information will improve Description: Including pain rating scale, medication(s)/side effects and non-pharmacologic comfort measures Outcome: Progressing   Problem: Activity: Goal: Risk for activity intolerance will decrease Outcome: Progressing   Problem: Coping: Goal: Level of anxiety will decrease Outcome: Progressing   Problem: Pain Managment: Goal: General experience of comfort will improve Outcome: Progressing

## 2023-02-15 NOTE — Progress Notes (Signed)
TRH night cross cover note:   I was notified by RN of the patient's tachycardia this morning, with HR's sustained in the 130's. It is reported to appear regular on the monitor, but with potential sawtooth appearance. She has an elevated temp of 101.2, while noting Tmax yest of 102.9. CXR is pending this AM. SBP's in the 90's to low 100's. Pt c/o back pain, but otherwise no acute symptoms, including no CP, SOB. Prn  acetaminophen recently administered.   In additional to existing order for cxr, I've ordered STAT EKG as well as single dose of iv toradol to address both the fever as well as the back pain, with plan to monitor HR response to these measures. Morning labs, including CBC, CMP, mag, procalcitonin, and crp, are all pending at this time.       Newton Pigg, DO Hospitalist

## 2023-02-15 NOTE — Progress Notes (Signed)
Neurology Progress Note  Subjective:  Hospitalist called overnight due to fever, HR, and reports of back pain. She was given a dose of IV Toradol. CXR this morning shows no evidence of acute cardiopulmonary disease. Blood cultures pending. CRP is elevated. MRI Brain w wo and MRI C Spine w wo ordered.    Examination Tmax overnight 38.4 (101.2), HR 147  Vital signs in last 24 hours: Temp:  [98.7 F (37.1 C)-102.9 F (39.4 C)] 98.7 F (37.1 C) (07/14 0805) Pulse Rate:  [91-147] 147 (07/14 0805) Resp:  [17-26] 24 (07/14 0805) BP: (93-114)/(55-70) 93/70 (07/14 0805) SpO2:  [90 %-99 %] 90 % (07/14 0805)   Physical Exam  Constitutional: Appears well-developed and well-nourished.   Cardiovascular: HR is irregular, internment afib on the monitor   Respiratory: Effort normal, non-labored breathing MSK: neck pain worse in shoulders and with lateral head movements.  Negative brudzinski sign Inconsistent kernig sign as she is reporting constant neck pain No nuchal rigidity Pain to neck is increased with palpation   Neuro: Mental Status: Patient is awake, alert, oriented to person, place, month, year, and situation Patient is able to give a clear and coherent history. No signs of aphasia or neglect Cranial Nerves: II: Visual Fields are full. Pupils are equal, round, and reactive to light.   III,IV, VI: EOMI without ptosis or diploplia.  V: Facial sensation is symmetric to temperature VII: Facial movement is symmetric resting and smiling VIII: Hearing is intact to voice X: Palate elevates symmetrically XI: Shoulder shrug is symmetric. XII: Tongue protrudes midline without atrophy or fasciculations.  Motor: Tone is normal. Bulk is normal. 5/5 strength was present in all four extremities.  Sensory: Sensation is symmetric to light touch and temperature in the arms and legs.  Cerebellar: FNF and HKS are intact bilaterally. Tremor at rest at baseline  Basic Metabolic Panel: Recent Labs   Lab 02/10/23 0318 02/11/23 0202 02/12/23 0354 02/13/23 0406 02/14/23 0155  NA 140 137 139 138 136  K 3.0* 3.0* 3.2* 3.2* 4.3  CL 104 104 104 99 103  CO2 23 25 27 27 26   GLUCOSE 85 137* 156* 117* 108*  BUN 12 6* 5* <5* 10  CREATININE 0.78 0.69 0.64 0.62 0.71  CALCIUM 8.5* 8.5* 8.7* 8.8* 8.5*  MG  --  1.8 1.9 1.8  --     CBC: Recent Labs  Lab 02/10/23 0318 02/11/23 0202 02/12/23 0354 02/13/23 1310 02/14/23 0155  WBC 5.4 5.9 6.1 8.2 6.8  NEUTROABS  --  4.1 4.6  --  4.8  HGB 12.0 12.3 11.9* 11.7* 11.0*  HCT 36.9 36.3 35.8* 35.9* 33.2*  MCV 102.8* 100.0 100.3* 101.4* 104.1*  PLT 108* 111* 104* 99* 93*   Lab Results  Component Value Date   TSH 1.576 02/15/2023   Lab Results  Component Value Date   CRP 20.9 (H) 02/15/2023    Imaging No new brain imaging overnight  Assessment:: 67 year old female with a 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 She presents with breakthrough seizures in the setting of medication noncompliance due to changes in insurance causing delay in receiving her Vimpat.   # Epilepsy with breakthrough seizures - Exam reveals intact cognition and normal strength. Neck pain is present without meningismus.  - Most likely etiology for her breakthrough seizures is missing vimpat # Fever - Infectious work up underway - MRI brain w wo ordered, blood cultures pending - May consider LP after  MRI is completed, but this is felt likely to be low yield due to intact cognition today and no meningismus   Recommendations: - Continue home dosing regimen of Vimpat 200mg  bid + dilantin 130mg  in AM and 100mg  in PM (see Dr. Rosalyn Gess note from May 2024, dilantin toxicity has been an issue so regardless of seizure recurrence, DO NOT recommend increasing dilantin even in presence of subtherapeutic levels)  - Neurontin 100 mg TID was added this admission - Switched from Keppra to Onfi 5 mg nightly.   Recommend continuing at discharge - Follow-up with Dr. Karel Jarvis in 2 to 3 months - MRI brain w wo ordered - MRI C-spine w wo ordered - Infectious work up per primary team  - PRN IV ativan for clinical seizure - Continue seizure precautions  Seizure precautions: Per [facility] statutes, patients with seizures are not allowed to drive until they have been seizure-free for six months and cleared by a physician  Use caution when using heavy equipment or power tools. Avoid working on ladders or at heights. Take showers instead of baths. Ensure the water temperature is not too high on the home water heater. Do not go swimming alone. Do not lock yourself in a room alone (i.e. bathroom). When caring for infants or small children, sit down when holding, feeding, or changing them to minimize risk of injury to the child in the event you have a seizure. Maintain good sleep hygiene. Avoid alcohol.      Patient seen and examined by NP/APP Elmer Picker, DNP, FNP-BC [facility] Pager: [phone]  Electronically signed: Dr. Caryl Pina

## 2023-02-15 NOTE — Plan of Care (Signed)

## 2023-02-15 NOTE — Plan of Care (Signed)
  Problem: Education: Goal: Expressions of having a comfortable level of knowledge regarding the disease process will increase Outcome: Progressing   Problem: Health Behavior/Discharge Planning: Goal: Compliance with prescribed medication regimen will improve Outcome: Progressing   Problem: Safety: Goal: Verbalization of understanding the information provided will improve Outcome: Progressing

## 2023-02-15 NOTE — Progress Notes (Signed)
PROGRESS NOTE                                                                                                                                                                                                             Patient Demographics:    [name], is a 67 y.o. female, DOB - 08-31-1955, ZOX:[id number]  Outpatient Primary MD for the patient is [name], FNP    LOS - 5  Admit date - 02/09/2023    Chief Complaint  Patient presents with   Seizures       Brief Narrative (HPI from H&P)      67 y.o. female with medical history significant of thrombocytopenia, paroxysmal A-fib, hypertension, hyperlipidemia, GERD, DVT, chronic pain, COPD, anxiety, hemorrhagic stroke, stress incontinence, seizure disorder, neuropathy, breast cancer presenting after multiple seizures at home.  Orting to the family members patient was compliant with her Dilantin dose and was taking it on a daily basis however she did run out of Vimpat few days ago due to insurance problems, in the ER Dilantin levels were low, she was seen by neurology and admitted for ongoing seizures.   Subjective:   Patient in bed denies any headache, does have ongoing neck pain for the last 24 hours, some palpitations, no shortness of breath, no abdominal pain, no chest pain.   Assessment  & Plan :    Breakthrough seizures.  Ran out of Vimpat but was taking Dilantin as prescribed, Dilantin levels were still low, seen by neurology, head CT nonacute, EEG does not show any acute seizures but does show evidence of epileptogenicity and cortical dysfunction, had another seizure morning of 02/10/2023, currently on home dose of Dilantin, cannot increase the dose due to history of side effects with high-dose, continue Vimpat, seizures seems to have improved, defer further management to neurology clinically much better with daily improvement, UA stable.  If continues to improve likely  discharge home with home PT on 02/13/2023. -Discussed with the neurology, she continues to improve, no seizures noted on EEG, likely can DC LTM EEG tomorrow if she remains seizure-free, Meds management per neurology  Fever  -clear source but daily x 3 days now, history of multiple clots in the past not on anticoagulation due to history of intracerebral bleed in the past, has IVC filter, I strongly suspect that she has  another clot at this time, no clear signs of infection, checking lower extremity venous duplex.  Monitor.  Risks and benefits of anticoagulation if she has an acute clot despite IVC filter has been discussed with the family.  New onset a flutter on 02/15/2023 with neck pain and fevers.  MRI C-spine, CT chest abdomen pelvis to rule out other source of infection, rule out blood clot as she has previous history of clots and not on anticoagulation, check echo and TSH, converted to NSR with Cardizem drip, now on oral Cardizem for rate control.  [name] vas 2 score would be greater than 3.  Mild chronic thrombocytopenia.  Stable.  Stress incontinence.  On oxybutynin  Chronic pain.  On tramadol.  Neuropathy.  Lyrica switched to Neurontin, will be discharged on Neurontin.  Hypokalemia.  Replaced      Condition - Extremely Guarded  Family Communication  :   Daughter French Ana [phone] - 02/10/23, 02/14/23   Updated son Sharia Reeve [phone]  on 02/11/2023 and 02/12/2023, 02/14/23, son bedside 02/15/2023   Code Status :  Full  Consults  :  Neuro  PUD Prophylaxis :     Procedures  :     Echocardiogram.    MRI C-spine.    CT chest abdomen pelvis with oral and IV contrast.   Vascular duplex lower extremity   EEG - This study showed one seizure without clinical signs on 02/10/2023 at 1913  arising from  right posterior quadrant, lasting about 45 seconds. Additionally there was evidence of epileptogenicity and cortical dysfunction arising from right hemisphere, maximal right posterior  quadrant.    Study has improved significantly compared to previous day.   EEG - This study showed evidence of epileptogenicity and cortical dysfunction arising from right hemisphere, maximal right posterior quadrant. This EEG pattern is on the ictal-interictal continuum with potential for seizures.  CT - 1.  No evidence of an acute intracranial abnormality. 2. 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 Correlate with the surgical history. 3. Right maxillary sinusitis. 4. Small-volume fluid within the left mastoid air cells.      Disposition Plan  :    Status is: inpt  DVT Prophylaxis  :    enoxaparin (LOVENOX) injection 40 mg Start: 02/09/23 1800    Lab Results  Component Value Date   PLT 109 (L) 02/15/2023    Diet :  Diet Order             Diet regular Fluid consistency: Thin  Diet effective now                    Inpatient Medications  Scheduled Meds:  amoxicillin-clavulanate  1 tablet Oral Q12H   Chlorhexidine Gluconate Cloth  6 each Topical Daily   cloBAZam  5 mg Oral Daily   vitamin B-12  2,000 mcg Oral Daily   diltiazem  30 mg Oral Q8H   enoxaparin (LOVENOX) injection  40 mg Subcutaneous Q24H   gabapentin  100 mg Oral TID   lacosamide  200 mg Oral BID   oxybutynin  5 mg Oral QHS   phenytoin  100 mg Oral QPM   phenytoin  130 mg Oral Daily   Continuous Infusions:  diltiazem (CARDIZEM) infusion Stopped (02/15/23 0916)   lactated ringers 250 mL/hr at 02/15/23 0600   lactated ringers      PRN Meds:.acetaminophen **OR** acetaminophen, diltiazem, haloperidol lactate, ibuprofen, LORazepam, LORazepam, melatonin, polyethylene glycol  Antibiotics  :  Anti-infectives (From admission, onward)    Start     Dose/Rate Route Frequency Ordered Stop   02/13/23 1500  amoxicillin-clavulanate (AUGMENTIN) 875-125 MG per tablet 1 tablet        1 tablet Oral Every 12 hours  02/13/23 1404           Objective:   Vitals:   02/14/23 2354 02/15/23 0546 02/15/23 0805 02/15/23 0900  BP: 102/60 (!) 96/56 93/70 (!) 87/57  Pulse: 91  (!) 147 94  Resp: 17 20 (!) 24 12  Temp: 99.5 F (37.5 C) (!) 101.2 F (38.4 C) 98.7 F (37.1 C)   TempSrc: Oral Oral Oral   SpO2: 92%  90% 96%  Weight:        Wt Readings from Last 3 Encounters:  02/09/23 56.4 kg  01/12/23 58.4 kg  12/31/22 59.4 kg     Intake/Output Summary (Last 24 hours) at 02/15/2023 1008 Last data filed at 02/15/2023 0503 Gross per 24 hour  Intake 720 ml  Output 950 ml  Net -230 ml     Physical Exam  Awake Alert, No new F.N deficits, Normal affect [place].AT,PERRAL Supple Neck, No JVD,   Symmetrical Chest wall movement, Good air movement bilaterally, CTAB RRR,No Gallops, Rubs or new Murmurs,  +ve B.Sounds, Abd Soft, No tenderness,   No Cyanosis, Clubbing or edema       Data Review:    Recent Labs  Lab 02/11/23 0202 02/12/23 0354 02/13/23 1310 02/14/23 0155 02/15/23 0844  WBC 5.9 6.1 8.2 6.8 6.6  HGB 12.3 11.9* 11.7* 11.0* 11.1*  HCT 36.3 35.8* 35.9* 33.2* 33.2*  PLT 111* 104* 99* 93* 109*  MCV 100.0 100.3* 101.4* 104.1* 99.7  MCH 33.9 33.3 33.1 34.5* 33.3  MCHC 33.9 33.2 32.6 33.1 33.4  RDW 12.2 12.3 12.3 12.4 12.1  LYMPHSABS 1.2 0.9  --  1.2  --   MONOABS 0.5 0.5  --  0.6  --   EOSABS 0.0 0.0  --  0.1  --   BASOSABS 0.0 0.0  --  0.0  --     Recent Labs  Lab 02/09/23 1128 02/10/23 0318 02/10/23 0956 02/11/23 0202 02/12/23 0354 02/13/23 0406 02/13/23 1310 02/14/23 0155 02/15/23 0844  NA 141 140  --  137 139 138  --  136  --   K 3.4* 3.0*  --  3.0* 3.2* 3.2*  --  4.3  --   CL 104 104  --  104 104 99  --  103  --   CO2 22 23  --  25 27 27   --  26  --   ANIONGAP 15 13  --  8 8 12   --  7  --   GLUCOSE 110* 85  --  137* 156* 117*  --  108*  --   BUN 15 12  --  6* 5* <5*  --  10  --   CREATININE 1.08* 0.78  --  0.69 0.64 0.62  --  0.71  --   AST 23 17  --  16 16  --    --   --   --   ALT 15 12  --  14 13  --   --   --   --   ALKPHOS 51 47  --  45 51  --   --   --   --   BILITOT 0.7 1.0  --  0.5 0.4  --   --   --   --  ALBUMIN 3.8 3.4*  --  3.4* 3.3*  --   --   --   --   CRP  --   --   --   --   --   --   --   --  20.9*  PROCALCITON  --   --   --   --   --   --  <0.10  --  0.15  TSH  --   --   --   --   --   --   --   --  1.576  AMMONIA  --   --  33  --   --   --   --   --   --   BNP  --   --   --  38.5 22.6  --   --   --   --   MG  --   --   --  1.8 1.9 1.8  --   --   --   CALCIUM 9.0 8.5*  --  8.5* 8.7* 8.8*  --  8.5*  --     Micro Results Recent Results (from the past 240 hour(s))  Culture, group A strep     Status: None (Preliminary result)   Collection Time: 02/13/23 11:35 AM   Specimen: Throat  Result Value Ref Range Status   Specimen Description THROAT  Final   Special Requests NONE  Final   Culture   Final    TOO YOUNG TO READ Performed at [facility], [address] [place], [place] [address]    Report Status PENDING  Incomplete  Group A Strep by PCR     Status: None   Collection Time: 02/13/23  3:54 PM   Specimen: Throat; Sterile Swab  Result Value Ref Range Status   Group A Strep by PCR NOT DETECTED NOT DETECTED Final    Comment: Performed at [facility], [address], [place], [place] [address]    Radiology Reports DG Chest [place] 1 View  Result Date: 02/15/2023 CLINICAL DATA:  67 year old female with history of shortness of breath. Seizures. EXAM: PORTABLE CHEST 1 VIEW COMPARISON:  Chest x-ray 02/13/2023. FINDINGS: There is a right upper extremity PICC with tip terminating in the distal superior vena cava. Lung volumes are normal. No consolidative airspace disease. No pleural effusions. No pneumothorax. No pulmonary nodule or mass noted. Pulmonary vasculature and the cardiomediastinal silhouette are within normal limits. Atherosclerosis in the thoracic aorta. Numerous surgical clips project over the left breast and  left axilla, likely from prior lumpectomy and lymph node dissection. IMPRESSION: 1. Support apparatus, as above. 2. No radiographic evidence of acute cardiopulmonary disease. 3. Aortic atherosclerosis. Electronically Signed   By: Trudie Reed M.D.   On: 02/15/2023 08:15   DG Chest Port 1 View  Result Date: 02/13/2023 CLINICAL DATA:  Fever EXAM: PORTABLE CHEST 1 VIEW COMPARISON:  02/09/2023 FINDINGS: PICC on the right with tip at the upper cavoatrial junction. Bilateral interstitial coarsening from likely from airway thickening. There is no edema, consolidation, effusion, or pneumothorax. Normal heart size and mediastinal contours. Remote right mid clavicle fracture with healing. IMPRESSION: Mild bronchitic markings.  No focal pneumonia. Electronically Signed   By: Tiburcio Pea M.D.   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 She presents with breakthrough seizures in the setting of medication noncompliance due to changes in insurance causing delay in receiving her Vimpat.  EEG to evaluate for seizure  Level of alertness: Awake, asleep  AEDs during EEG study: LCM, PHT, GBP, Onfi  Technical aspects: This EEG study was done with scalp electrodes positioned according to the 10-20 International system of electrode placement. Electrical activity was reviewed with band pass filter of 1-70Hz , sensitivity of 7 uV/mm, display speed of 53mm/sec with a 60Hz  notched filter applied as appropriate. EEG data were recorded continuously and digitally stored.  Video monitoring was available and reviewed as appropriate.   Description: The posterior dominant rhythm consists of 8-9 Hz activity of moderate voltage (25-35 uV) seen predominantly in posterior head regions, symmetric and reactive to eye opening and eye closing. Sleep was characterized by sleep spindles (12-14hz ), maximal fronto-central region. EEG showed continuous polymorphic sharply contoured rhythmic 3 to 6 Hz theta-delta slowing in right hemisphere, maximal right posterior quadrant. Abundant sharp waves were noted in right posterior quadrant. Hyperventilation and photic stimulation were not performed.   ABNORMALITY - Sharp waves, right posterior quadrant - Continuous rhythmic slow, right hemisphere, maximal right posterior quadrant  IMPRESSION: This study showed evidence of epileptogenicity and cortical dysfunction arising from right hemisphere, maximal right posterior quadrant. No seizures were noted.  Charlsie Quest     Signature  -   Susa Raring M.D on 02/15/2023 at 10:08 AM   -  To page go to [email or web address]

## 2023-02-15 NOTE — Progress Notes (Signed)
LTM EEG discontinued - no skin breakdown at unhook.   

## 2023-02-16 ENCOUNTER — Inpatient Hospital Stay (HOSPITAL_COMMUNITY): Payer: Medicare HMO

## 2023-02-16 ENCOUNTER — Other Ambulatory Visit (HOSPITAL_COMMUNITY): Payer: Self-pay

## 2023-02-16 DIAGNOSIS — R569 Unspecified convulsions: Secondary | ICD-10-CM | POA: Diagnosis not present

## 2023-02-16 DIAGNOSIS — I4891 Unspecified atrial fibrillation: Secondary | ICD-10-CM

## 2023-02-16 LAB — RESPIRATORY PANEL BY PCR

## 2023-02-16 LAB — MAGNESIUM: Magnesium: 2 mg/dL (ref 1.7–2.4)

## 2023-02-16 LAB — CBC WITH DIFFERENTIAL/PLATELET
Abs Immature Granulocytes: 0.04 10*3/uL (ref 0.00–0.07)
Basophils Absolute: 0 10*3/uL (ref 0.0–0.1)
Basophils Relative: 0 %
Eosinophils Absolute: 0 10*3/uL (ref 0.0–0.5)
Eosinophils Relative: 1 %
HCT: 29.1 % — ABNORMAL LOW (ref 36.0–46.0)
Hemoglobin: 9.3 g/dL — ABNORMAL LOW (ref 12.0–15.0)
Immature Granulocytes: 1 %
Lymphocytes Relative: 27 %
Lymphs Abs: 1.1 10*3/uL (ref 0.7–4.0)
MCH: 32.7 pg (ref 26.0–34.0)
MCHC: 32 g/dL (ref 30.0–36.0)
MCV: 102.5 fL — ABNORMAL HIGH (ref 80.0–100.0)
Monocytes Absolute: 0.4 10*3/uL (ref 0.1–1.0)
Monocytes Relative: 10 %
Neutro Abs: 2.6 10*3/uL (ref 1.7–7.7)
Neutrophils Relative %: 61 %
Platelets: 108 10*3/uL — ABNORMAL LOW (ref 150–400)
RBC: 2.84 MIL/uL — ABNORMAL LOW (ref 3.87–5.11)
RDW: 12.4 % (ref 11.5–15.5)
WBC: 4.2 10*3/uL (ref 4.0–10.5)
nRBC: 0 % (ref 0.0–0.2)

## 2023-02-16 LAB — ECHOCARDIOGRAM COMPLETE
Area-P 1/2: 2.99 cm2
Calc EF: 69.7 %
MV VTI: 2.16 cm2
S' Lateral: 2.9 cm
Single Plane A2C EF: 67.1 %
Single Plane A4C EF: 71.6 %
Weight: 1989.43 oz

## 2023-02-16 LAB — CULTURE, GROUP A STREP (THRC)

## 2023-02-16 LAB — COMPREHENSIVE METABOLIC PANEL
ALT: 17 U/L (ref 0–44)
AST: 17 U/L (ref 15–41)
Albumin: 2.4 g/dL — ABNORMAL LOW (ref 3.5–5.0)
Alkaline Phosphatase: 43 U/L (ref 38–126)
Anion gap: 4 — ABNORMAL LOW (ref 5–15)
BUN: 10 mg/dL (ref 8–23)
CO2: 27 mmol/L (ref 22–32)
Calcium: 8.2 mg/dL — ABNORMAL LOW (ref 8.9–10.3)
Chloride: 104 mmol/L (ref 98–111)
Creatinine, Ser: 0.73 mg/dL (ref 0.44–1.00)
GFR, Estimated: >60 mL/min (ref >60)
Glucose, Bld: 100 mg/dL — ABNORMAL HIGH (ref 70–99)
Potassium: 3.8 mmol/L (ref 3.5–5.1)
Sodium: 135 mmol/L (ref 135–145)
Total Bilirubin: 0.4 mg/dL (ref 0.3–1.2)
Total Protein: 5.4 g/dL — ABNORMAL LOW (ref 6.5–8.1)

## 2023-02-16 LAB — C-REACTIVE PROTEIN: CRP: 19.2 mg/dL — ABNORMAL HIGH (ref <1.0)

## 2023-02-16 LAB — BRAIN NATRIURETIC PEPTIDE: B Natriuretic Peptide: 94.1 pg/mL (ref 0.0–100.0)

## 2023-02-16 LAB — PROCALCITONIN: Procalcitonin: 0.1 ng/mL

## 2023-02-16 MED ORDER — GADOBUTROL 1 MMOL/ML IV SOLN
7.0000 mL | Freq: Once | INTRAVENOUS | Status: AC | PRN
  Administered 2023-02-16: 7 mL via INTRAVENOUS

## 2023-02-16 MED ORDER — IPRATROPIUM-ALBUTEROL 0.5-2.5 (3) MG/3ML IN SOLN
3.0000 mL | Freq: Four times a day (QID) | RESPIRATORY_TRACT | Status: DC | PRN
  Administered 2023-02-16: 3 mL via RESPIRATORY_TRACT

## 2023-02-16 MED ORDER — IPRATROPIUM-ALBUTEROL 0.5-2.5 (3) MG/3ML IN SOLN
RESPIRATORY_TRACT | Status: AC
  Filled 2023-02-16: qty 3

## 2023-02-16 MED ORDER — LACTATED RINGERS IV SOLN: INTRAVENOUS | Status: DC

## 2023-02-16 MED ORDER — MORPHINE SULFATE (PF) 2 MG/ML IV SOLN
1.0000 mg | INTRAVENOUS | Status: DC | PRN
  Administered 2023-02-16: 1 mg via INTRAVENOUS
  Filled 2023-02-16: qty 1

## 2023-02-16 MED ORDER — ORAL CARE MOUTH RINSE: 15.0000 mL | OROMUCOSAL | Status: DC | PRN

## 2023-02-16 MED ORDER — TOPIRAMATE 25 MG PO TABS
25.0000 mg | ORAL_TABLET | Freq: Two times a day (BID) | ORAL | Status: DC
  Administered 2023-02-16 – 2023-02-17 (×2): 25 mg via ORAL
  Filled 2023-02-16 (×2): qty 1

## 2023-02-16 MED ORDER — GADOBUTROL 1 MMOL/ML IV SOLN
6.0000 mL | Freq: Once | INTRAVENOUS | Status: AC | PRN
  Administered 2023-02-16: 6 mL via INTRAVENOUS

## 2023-02-16 NOTE — Progress Notes (Signed)
Cardizem scheduled for 2200 not given BP 104/57, last reading 97/53.  Howerter, MD notified.

## 2023-02-16 NOTE — Progress Notes (Addendum)
Subjective: Remains seizure-free.  However continues to have low-grade fever.  Tmax was 100.22F overnight  ROS: negative except above Examination  Vital signs in last 24 hours: Temp:  [98 F (36.7 C)-100.2 F (37.9 C)] 98 F (36.7 C) (07/15 0427) Pulse Rate:  [68-100] 78 (07/15 1100) Resp:  [17-25] 23 (07/15 1100) BP: (93-129)/(55-68) 110/61 (07/15 1100) SpO2:  [91 %-96 %] 96 % (07/15 1100)  General: lying in bed, NAD  Neuro: MS: Alert, oriented, follows commands CN: pupils equal and reactive,  EOMI, face symmetric, tongue midline, normal sensation over face, Motor: 5/5 strength in all 4 extremities Reflexes: 2+ bilaterally over patella, biceps, plantars: flexor Coordination: normal Gait: not tested  Basic Metabolic Panel: Recent Labs  Lab 02/11/23 0202 02/12/23 0354 02/13/23 0406 02/14/23 0155 02/15/23 0844 02/16/23 0410  NA 137 139 138 136 134* 135  K 3.0* 3.2* 3.2* 4.3 3.3* 3.8  CL 104 104 99 103 96* 104  CO2 25 27 27 26 22 27   GLUCOSE 137* 156* 117* 108* 125* 100*  BUN 6* 5* <5* 10 10 10   CREATININE 0.69 0.64 0.62 0.71 0.76 0.73  CALCIUM 8.5* 8.7* 8.8* 8.5* 8.6* 8.2*  MG 1.8 1.9 1.8  --  1.7 2.0    CBC: Recent Labs  Lab 02/11/23 0202 02/12/23 0354 02/13/23 1310 02/14/23 0155 02/15/23 0844 02/16/23 0410  WBC 5.9 6.1 8.2 6.8 6.6 4.2  NEUTROABS 4.1 4.6  --  4.8  --  2.6  HGB 12.3 11.9* 11.7* 11.0* 11.1* 9.3*  HCT 36.3 35.8* 35.9* 33.2* 33.2* 29.1*  MCV 100.0 100.3* 101.4* 104.1* 99.7 102.5*  PLT 111* 104* 99* 93* 109* 108*     Coagulation Studies: No results for input(s): "LABPROT", "INR" in the last 72 hours.  Imaging MRI brain without contrast 02/15/23: No acute intracranial process. No evidence of acute or subacute infarct.    MRI brain with contrast 02/16/2023:No focus of abnormal contrast enhancement to suggest leptomeningeal disease or mass lesion.  MRI C-spine without contrast 02/15/2023: Significantly motion limited examination. Within this  limitation, there is disc height loss at C5-C6 and C6-C7 with possible mild spinal canal stenosis at these levels. The axial images are essentially nondiagnostic for neural foraminal narrowing.    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 She presents with breakthrough seizures in the setting of medication noncompliance due to changes in insurance causing delay in receiving her Vimpat.     Epilepsy with breakthrough seizure - Most likely etiology for her breakthrough seizures is missing vimpat   Recommendations: - Continue home dosing regimen of Vimpat 200mg  bid + dilantin 130mg  in AM and 100mg  in PM (see Dr. Rosalyn Gess note from May 2024, dilantin toxicity has been an issue so regardless of seizure recurrence, DO NOT recommend increasing dilantin even in presence of subtherapeutic levels)  - Neurontin 100 mg TID was added this admission -Switched from Keppra to Onfi 5 mg nightly.  Recommend continuing at discharge -Rest medication: Klonopin oral dissolving tablet 1 mg fortunately for seizure lasting more than 2 minutes.  Intranasal Valtoco not covered by insurance -Follow-up with Dr. Karel Jarvis in 2 to 3 months - PRN IV ativan for clinical seizure while inpatient - Continue seizure precautions -Discussed plan with Dr. Thedore Mins  Fever -No clear etiology for fever so far.  -Given patient's mental status, presentation as well as MRI findings, I do not think that we need to  pursue lumbar puncture at this point -MRI C-spine with contrast ordered and pending -Only new medication in the last few days has been unclear.  Although it is not a typical side effect of Onfi, if fever recurs, can consider holding Onfi.  I have spent a total of  26  minutes with the patient reviewing hospital notes,  test results, labs and examining the patient as well as establishing an assessment and plan that  was discussed personally with the patient.  > 50% of time was spent in direct patient care.    Lindie Spruce Epilepsy [facility] For questions after 5pm please refer to AMION to reach the Neurologist on call

## 2023-02-16 NOTE — Progress Notes (Signed)
Pt c/o mild SOB and chest tightness, she stated her symptoms are same once she experience with asthma. SpO2 in low 90s, other VS stable, no signs of acute distress, respirations unlabored and regular, lung sounds clear diminished in upper lobes and diminished in lower lobes, no wheezing at this time.  Pt daughter at the bedside. Howerter, MD notified, and new orders placed. Duoneb administered as ordered, and pt placed on 2L O2 via nasal cannula. Pt verbalized improvement, SpO2 96 on 2L O2 via [place]. EKG and chest x ray completed and MD made aware.  Pt resting, seems to be comfortable at this time.

## 2023-02-16 NOTE — Progress Notes (Signed)
PT Cancellation Note  Patient Details Name: [name] MRN: [id number] DOB: January 20, 1956   Cancelled Treatment:    Reason Eval/Treat Not Completed: Pain limiting ability to participate. Cannot describe pain, just states all over, moaning and unable to participate at this time. Will continue to follow.    Angelia Hazell 02/16/2023, 1:50 PM

## 2023-02-16 NOTE — Progress Notes (Signed)
TRH night cross cover note:   I was notified by RN that the patient's evening scheduled Cardizem was held due to soft blood pressure, with most recent blood pressure noted to be 97/53. Most recent HR's in the 70's - 80's. No new symptoms reported at this time.    Newton Pigg, DO Hospitalist

## 2023-02-16 NOTE — Progress Notes (Signed)
Echo attempted at [phone]. Pt in MRI at this time. Will attempt again later.

## 2023-02-16 NOTE — Progress Notes (Signed)
Inpatient Rehab Admissions Coordinator:   Following for my colleague, Ottie Glazier.  We received insurance approval for CIR admit.  Note medical workup ongoing.  Will follow for potential admit when ready and bed available.   Estill Dooms, PT, DPT Admissions Coordinator [phone] 02/16/23  10:59 AM

## 2023-02-16 NOTE — Progress Notes (Signed)
  Echocardiogram 2D Echocardiogram has been performed.  [name] 02/16/2023, 11:55 AM

## 2023-02-16 NOTE — Progress Notes (Signed)
PROGRESS NOTE                                                                                                                                                                                                             Patient Demographics:    [name], is a 67 y.o. female, DOB - 1955/11/25, UYQ:[id number]  Outpatient Primary MD for the patient is [name], FNP    LOS - 6  Admit date - 02/09/2023    Chief Complaint  Patient presents with   Seizures       Brief Narrative (HPI from H&P)      67 y.o. female with medical history significant of thrombocytopenia, paroxysmal A-fib, hypertension, hyperlipidemia, GERD, DVT, chronic pain, COPD, anxiety, hemorrhagic stroke, stress incontinence, seizure disorder, neuropathy, breast cancer presenting after multiple seizures at home.  Orting to the family members patient was compliant with her Dilantin dose and was taking it on a daily basis however she did run out of Vimpat few days ago due to insurance problems, in the ER Dilantin levels were low, she was seen by neurology and admitted for ongoing seizures.   Subjective:   Patient in bed denies any headache, does have ongoing neck pain for the last 24 hours, some palpitations, no shortness of breath, no abdominal pain, no chest pain.   Assessment  & Plan :    Breakthrough seizures.  Ran out of Vimpat but was taking Dilantin as prescribed, Dilantin levels were still low, seen by neurology, head CT nonacute, EEG does not show any acute seizures but does show evidence of epileptogenicity and cortical dysfunction, had another seizure morning of 02/10/2023, currently on home dose of Dilantin, cannot increase the dose due to history of side effects with high-dose, continue Vimpat, seizures seems to have improved, defer further management to neurology clinically much better with daily improvement, UA stable.  If continues to improve likely  discharge home with home PT on 02/13/2023. -Discussed with the neurology, she continues to improve, no seizures noted on EEG, likely can DC LTM EEG tomorrow if she remains seizure-free, Meds management per neurology  History of DVT PE.  Has IVC filter, was not placed on anticoagulation chronically due to intracerebral bleed in the past, chronic left lower extremity DVT for which she already has IVC filter, CTA unremarkable.  Continue to  monitor.  Patient would like to refrain from anticoagulation.    History of intracerebral bleed.  No acute issues.  Wants to avoid anticoagulation.    Fever  -clear source but daily x 3 days now, history of multiple clots in the past not on anticoagulation due to history of intracerebral bleed in the past, has IVC filter, CTA chest and lower extremity venous duplex showed no acute clots but chronic lower extremity DVT in the left leg, doubt this is causing her fevers, CTA chest abdomen pelvis nonacute, MRI of the brain stable but MRI of the C-spine noncontrast part so far unrevealing contrast part to be done on 02/16/2023, so far blood cultures negative, does have some sore throat but strep throat test negative as well, check viral respiratory panel and monitor.  Overall appears nontoxic.  New onset a flutter on 02/15/2023 with neck pain and fevers.  Fever workup as above, TSH stable echo pending, converted to NSR after a short course of Cardizem within few hours of being in A-fib/flutter, [name] vas 2 days greater than 3 however patient wants to refrain from anticoagulation due to history of intracerebral bleed, continue to monitor on low-dose Cardizem orally if stable outpatient cardiology follow-up.  Mild chronic thrombocytopenia.  Stable.  Stress incontinence.  On oxybutynin  Chronic pain.  On tramadol.  Neuropathy.  Lyrica switched to Neurontin, will be discharged on Neurontin.  Hypokalemia.  Replaced  Chronic left lower lung nodule.  Follow-up with PCP, appears  stable for the last 2 years.      Condition - Extremely Guarded  Family Communication  :   Daughter French Ana [phone] - 02/10/23, 02/14/23   Updated son Sharia Reeve [phone]  on 02/11/2023 and 02/12/2023, 02/14/23, son bedside 02/15/2023, 02/16/2023   Code Status :  Full  Consults  :  Neuro  PUD Prophylaxis :     Procedures  :     Echocardiogram.    MRI C-spine.    MRI brain.  No acute intracranial process. No evidence of acute or subacute infarct  CT chest abdomen pelvis with oral and IV contrast. - 1. No acute or focal lesion to explain the patient's sepsis. 2. Stable 5 mm nodule posteriorly in the left lower lobe. Given the stability for over 2 years, this is likely benign. Recommend no imaging follow-up. 3. IVC filter is in place. 4. No evidence for metastatic disease to the chest, abdomen, or pelvis. 5. Aortic Atherosclerosis (ICD10-I70.0) and Emphysema (ICD10-J43.9).   Vascular duplex lower extremity - chronic deep vein thrombosis involving the left femoral vein   EEG - This study showed one seizure without clinical signs on 02/10/2023 at 1913  arising from  right posterior quadrant, lasting about 45 seconds. Additionally there was evidence of epileptogenicity and cortical dysfunction arising from right hemisphere, maximal right posterior quadrant.    Study has improved significantly compared to previous day.   EEG - This study showed evidence of epileptogenicity and cortical dysfunction arising from right hemisphere, maximal right posterior quadrant. This EEG pattern is on the ictal-interictal continuum with potential for seizures.  CT - 1.  No evidence of an acute intracranial abnormality. 2. 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 Correlate with the surgical history. 3. Right maxillary sinusitis. 4. Small-volume fluid within the left mastoid air cells.      Disposition Plan  :     Status is: inpt  DVT Prophylaxis  :    enoxaparin (LOVENOX) injection  40 mg Start: 02/09/23 1800    Lab Results  Component Value Date   PLT 108 (L) 02/16/2023    Diet :  Diet Order             Diet regular Fluid consistency: Thin  Diet effective now                    Inpatient Medications  Scheduled Meds:  amoxicillin-clavulanate  1 tablet Oral Q12H   Chlorhexidine Gluconate Cloth  6 each Topical Daily   cloBAZam  5 mg Oral Daily   vitamin B-12  2,000 mcg Oral Daily   diltiazem  30 mg Oral Q8H   enoxaparin (LOVENOX) injection  40 mg Subcutaneous Q24H   gabapentin  100 mg Oral TID   lacosamide  200 mg Oral BID   oxybutynin  5 mg Oral QHS   phenytoin  100 mg Oral QPM   phenytoin  130 mg Oral Daily   Continuous Infusions:  diltiazem (CARDIZEM) infusion Stopped (02/15/23 0916)    PRN Meds:.acetaminophen **OR** acetaminophen, diltiazem, haloperidol lactate, ibuprofen, LORazepam, LORazepam, melatonin, mouth rinse, polyethylene glycol, traMADol  Antibiotics  :    Anti-infectives (From admission, onward)    Start     Dose/Rate Route Frequency Ordered Stop   02/13/23 1500  amoxicillin-clavulanate (AUGMENTIN) 875-125 MG per tablet 1 tablet        1 tablet Oral Every 12 hours 02/13/23 1404           Objective:   Vitals:   02/15/23 2129 02/15/23 2356 02/16/23 0427 02/16/23 0605  BP: 104/64 93/65 104/62 106/68  Pulse:  73 68   Resp:  18 (!) 22   Temp:  98.6 F (37 C) 98 F (36.7 C)   TempSrc:  Oral Oral   SpO2:  93% 92%   Weight:        Wt Readings from Last 3 Encounters:  02/09/23 56.4 kg  01/12/23 58.4 kg  12/31/22 59.4 kg     Intake/Output Summary (Last 24 hours) at 02/16/2023 0736 Last data filed at 02/16/2023 0400 Gross per 24 hour  Intake --  Output 1450 ml  Net -1450 ml     Physical Exam  Awake Alert, No new F.N deficits, Normal affect [place].AT,PERRAL Supple Neck, No JVD,   Symmetrical Chest wall movement, Good air movement  bilaterally, CTAB RRR,No Gallops, Rubs or new Murmurs,  +ve B.Sounds, Abd Soft, No tenderness,   No Cyanosis, Clubbing or edema       Data Review:    Recent Labs  Lab 02/11/23 0202 02/12/23 0354 02/13/23 1310 02/14/23 0155 02/15/23 0844 02/16/23 0410  WBC 5.9 6.1 8.2 6.8 6.6 4.2  HGB 12.3 11.9* 11.7* 11.0* 11.1* 9.3*  HCT 36.3 35.8* 35.9* 33.2* 33.2* 29.1*  PLT 111* 104* 99* 93* 109* 108*  MCV 100.0 100.3* 101.4* 104.1* 99.7 102.5*  MCH 33.9 33.3 33.1 34.5* 33.3 32.7  MCHC 33.9 33.2 32.6 33.1 33.4 32.0  RDW 12.2 12.3 12.3 12.4 12.1 12.4  LYMPHSABS 1.2 0.9  --  1.2  --  1.1  MONOABS 0.5 0.5  --  0.6  --  0.4  EOSABS 0.0 0.0  --  0.1  --  0.0  BASOSABS 0.0 0.0  --  0.0  --  0.0    Recent Labs  Lab 02/10/23 0318 02/10/23 0956 02/11/23 0202 02/12/23 0354 02/13/23 0406 02/13/23 1310 02/14/23 0155 02/15/23 0844 02/16/23 0410  NA 140  --  137 139 138  --  136 134* 135  K 3.0*  --  3.0* 3.2* 3.2*  --  4.3 3.3* 3.8  CL 104  --  104 104 99  --  103 96* 104  CO2 23  --  25 27 27   --  26 22 27   ANIONGAP 13  --  8 8 12   --  7 16* 4*  GLUCOSE 85  --  137* 156* 117*  --  108* 125* 100*  BUN 12  --  6* 5* <5*  --  10 10 10   CREATININE 0.78  --  0.69 0.64 0.62  --  0.71 0.76 0.73  AST 17  --  16 16  --   --   --  18 17  ALT 12  --  14 13  --   --   --  15 17  ALKPHOS 47  --  45 51  --   --   --  48 43  BILITOT 1.0  --  0.5 0.4  --   --   --  0.4 0.4  ALBUMIN 3.4*  --  3.4* 3.3*  --   --   --  2.6* 2.4*  CRP  --   --   --   --   --   --   --  20.9* 19.2*  PROCALCITON  --   --   --   --   --  <0.10  --  0.15 0.10  TSH  --   --   --   --   --   --   --  1.576  --   AMMONIA  --  33  --   --   --   --   --   --   --   BNP  --   --  38.5 22.6  --   --   --   --  94.1  MG  --   --  1.8 1.9 1.8  --   --  1.7 2.0  CALCIUM 8.5*  --  8.5* 8.7* 8.8*  --  8.5* 8.6* 8.2*    Micro Results Recent Results (from the past 240 hour(s))  Culture, group A strep     Status: None  (Preliminary result)   Collection Time: 02/13/23 11:35 AM   Specimen: Throat  Result Value Ref Range Status   Specimen Description THROAT  Final   Special Requests NONE  Final   Culture   Final    CULTURE REINCUBATED FOR BETTER GROWTH Performed at [facility], [address], [place], [place] [address]    Report Status PENDING  Incomplete  Group A Strep by PCR     Status: None   Collection Time: 02/13/23  3:54 PM   Specimen: Throat; Sterile Swab  Result Value Ref Range Status   Group A Strep by PCR NOT DETECTED NOT DETECTED Final    Comment: Performed at [facility], [address] [place], [place] [address]    Radiology Reports MR CERVICAL SPINE WO CONTRAST  Result Date: 02/15/2023 CLINICAL DATA:  Neck pain EXAM: MRI CERVICAL SPINE WITHOUT CONTRAST TECHNIQUE: Multiplanar, multisequence MR imaging of the cervical spine was performed. No intravenous contrast was administered. COMPARISON:  03/20/2018 MRI cervical spine FINDINGS: Evaluation is significantly motion limited, despite the use of less motion sensitive sequences. Within this limitation, there is disc height loss at C5-C6 and C6-C7, with possible mild spinal canal stenosis at these levels. The axial images  are essentially nondiagnostic for neural foraminal narrowing. No significant listhesis. Preservation of the normal cervical lordosis. IMPRESSION: Significantly motion limited examination. Within this limitation, there is disc height loss at C5-C6 and C6-C7 with possible mild spinal canal stenosis at these levels. The axial images are essentially nondiagnostic for neural foraminal narrowing. Electronically Signed   By: Wiliam Ke M.D.   On: 02/15/2023 19:44   MR BRAIN WO CONTRAST  Result Date: 02/15/2023 CLINICAL DATA:  Headache, fever EXAM: MRI HEAD WITHOUT CONTRAST TECHNIQUE: Multiplanar, multiecho pulse sequences of the brain and surrounding structures were obtained without intravenous contrast. COMPARISON:   08/26/2019 FINDINGS: Evaluation is limited by motion. Brain: No restricted diffusion to suggest acute or subacute infarct. No acute hemorrhage, mass, mass effect, or midline shift. No hydrocephalus or extra-axial collection. Normal pituitary and craniocervical junction. Redemonstrated large area of right parietooccipital encephalomalacia, which communicates with a dilated right lateral ventricle, unchanged. T2 hyperintense signal in the periventricular white matter, likely the sequela of mild chronic small vessel ischemic disease. Redemonstrated asymmetric right hippocampal volume loss. Vascular: Normal arterial flow voids. Skull and upper cervical spine: Right parietooccipital craniotomy. Otherwise normal marrow signal. Sinuses/Orbits: Mucosal thickening in the right maxillary sinus. No acute finding in the orbits. Other: Chronic left mastoid effusion. Trace fluid in the right mastoid air cells. IMPRESSION: No acute intracranial process. No evidence of acute or subacute infarct. Electronically Signed   By: Wiliam Ke M.D.   On: 02/15/2023 19:44   VAS [place] LOWER EXTREMITY VENOUS (DVT)  Result Date: 02/15/2023  Lower Venous DVT Study Patient Name:  NALIYAH NETH  Date of Exam:   02/15/2023 Medical Rec #: [id number]       Accession #:    [id number] Date of Birth: 08-Feb-1956       Patient Gender: F Patient Age:   61 years Exam Location:  [facility] Procedure:      VAS [place] LOWER EXTREMITY VENOUS (DVT) Referring Phys: Bess Harvest [facility] --------------------------------------------------------------------------------  Indications: Fever of unkown origin. Other Indications: History of LT lower DVT 10-10-2020. IVC filter placed                    10-12-2020. Comparison Study: Chronic LLE DVT on most recent study 04-28-2022. Performing Technologist: Jean Rosenthal RDMS, RVT  Examination Guidelines: A complete evaluation includes B-mode imaging, spectral Doppler, color Doppler, and power Doppler as needed of all accessible  portions of each vessel. Bilateral testing is considered an integral part of a complete examination. Limited examinations for reoccurring indications may be performed as noted. The reflux portion of the exam is performed with the patient in reverse Trendelenburg.  +---------+---------------+---------+-----------+----------+--------------+ RIGHT    CompressibilityPhasicitySpontaneityPropertiesThrombus Aging +---------+---------------+---------+-----------+----------+--------------+ CFV      Full           Yes      Yes                                 +---------+---------------+---------+-----------+----------+--------------+ SFJ      Full                                                        +---------+---------------+---------+-----------+----------+--------------+ FV Prox  Full                                                        +---------+---------------+---------+-----------+----------+--------------+  FV Mid   Full                                                        +---------+---------------+---------+-----------+----------+--------------+ FV DistalFull                                                        +---------+---------------+---------+-----------+----------+--------------+ PFV      Full                                                        +---------+---------------+---------+-----------+----------+--------------+ POP      Full           Yes      Yes                                 +---------+---------------+---------+-----------+----------+--------------+ PTV      Full                                                        +---------+---------------+---------+-----------+----------+--------------+ PERO     Full                                                        +---------+---------------+---------+-----------+----------+--------------+   +---------+---------------+---------+-----------+----------+--------------+ LEFT      CompressibilityPhasicitySpontaneityPropertiesThrombus Aging +---------+---------------+---------+-----------+----------+--------------+ CFV      Full           Yes      Yes                                 +---------+---------------+---------+-----------+----------+--------------+ SFJ      Full                                                        +---------+---------------+---------+-----------+----------+--------------+ FV Prox  Full                                                        +---------+---------------+---------+-----------+----------+--------------+ FV Mid   Full                                                        +---------+---------------+---------+-----------+----------+--------------+  FV DistalFull                                                        +---------+---------------+---------+-----------+----------+--------------+ PFV      Full                                                        +---------+---------------+---------+-----------+----------+--------------+ POP      Full           Yes      Yes                                 +---------+---------------+---------+-----------+----------+--------------+ PTV      Full                                                        +---------+---------------+---------+-----------+----------+--------------+ PERO     Full                                                        +---------+---------------+---------+-----------+----------+--------------+     Summary: RIGHT: - There is no evidence of deep vein thrombosis in the lower extremity.  - No cystic structure found in the popliteal fossa.  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 - No cystic structure found in the popliteal fossa.  *See table(s) above for measurements and observations.    Preliminary    CT CHEST ABDOMEN PELVIS W CONTRAST  Result Date: 02/15/2023 CLINICAL  DATA:  Sepsis.  Personal history of breast cancer. EXAM: CT CHEST, ABDOMEN, AND PELVIS WITH CONTRAST TECHNIQUE: Multidetector CT imaging of the chest, abdomen and pelvis was performed following the standard protocol during bolus administration of intravenous contrast. RADIATION DOSE REDUCTION: This exam was performed according to the departmental dose-optimization program which includes automated exposure control, adjustment of the mA and/or kV according to patient size and/or use of iterative reconstruction technique. CONTRAST:  75mL OMNIPAQUE IOHEXOL 350 MG/ML SOLN COMPARISON:  CT of the abdomen and pelvis 07/31/2022. CT of the chest, abdomen and pelvis 08/02/2020 FINDINGS: CT CHEST FINDINGS Cardiovascular: The heart size is normal. Aortic valve calcifications are present. Atherosclerotic calcifications are present at the aortic arch and great vessel origins without aneurysm or definite stenosis. Pulmonary arteries are unremarkable. A right-sided PICC line terminates just above the cavoatrial junction. Mediastinum/Nodes: No enlarged mediastinal, hilar, or axillary lymph nodes. Thyroid gland, trachea, and esophagus demonstrate no significant findings. Lungs/Pleura: Centrilobular emphysematous changes are present. The airways are patent. A 5 mm nodule is present posteriorly in the left lower lobe on image 104 of series 4. It is stable for over 2 years. No other nodule or mass lesion is present. No significant airspace disease is present. No significant pleural effusion or pneumothorax  is present. Musculoskeletal: The vertebral body heights and alignment are normal. Slight exaggerated kyphosis is stable. No focal osseous lesions are present. CT ABDOMEN PELVIS FINDINGS Hepatobiliary: No focal liver abnormality is seen. No gallstones, gallbladder wall thickening, or biliary dilatation. Pancreas: Unremarkable. No pancreatic ductal dilatation or surrounding inflammatory changes. Spleen: Normal in size without focal  abnormality. Adrenals/Urinary Tract: The adrenal glands are normal bilaterally. No stone or mass lesion is present. No obstruction is present. Ureters are within normal limits. The urinary bladder is normal. Stomach/Bowel: The stomach is within normal limits. A duodenal diverticulum is present without inflammatory change. Small bowel is within normal limits. The terminal ileum is normal. The appendix is not discretely visualized and may be surgically absent. The ascending and transverse colon are within normal limits. The descending and sigmoid colon are within normal limits. Vascular/Lymphatic: Extensive atherosclerotic calcifications are present the aorta and branch vessels. No aneurysm is present. IVC filter is in place. No significant adenopathy is present. Reproductive: Uterus and bilateral adnexa are unremarkable. Other: No abdominal wall hernia or abnormality. No abdominopelvic ascites. Musculoskeletal: Vertebral body heights are normal. No acute or healing fractures are present. No focal osseous lesions are present. The bony pelvis is within normal limits. The hips are located and normal. IMPRESSION: 1. No acute or focal lesion to explain the patient's sepsis. 2. Stable 5 mm nodule posteriorly in the left lower lobe. Given the stability for over 2 years, this is likely benign. Recommend no imaging follow-up. 3. IVC filter is in place. 4. No evidence for metastatic disease to the chest, abdomen, or pelvis. 5. Aortic Atherosclerosis (ICD10-I70.0) and Emphysema (ICD10-J43.9). Electronically Signed   By: Marin Roberts M.D.   On: 02/15/2023 11:57   DG Chest Port 1 View  Result Date: 02/15/2023 CLINICAL DATA:  67 year old female with history of shortness of breath. Seizures. EXAM: PORTABLE CHEST 1 VIEW COMPARISON:  Chest x-ray 02/13/2023. FINDINGS: There is a right upper extremity PICC with tip terminating in the distal superior vena cava. Lung volumes are normal. No consolidative airspace disease. No  pleural effusions. No pneumothorax. No pulmonary nodule or mass noted. Pulmonary vasculature and the cardiomediastinal silhouette are within normal limits. Atherosclerosis in the thoracic aorta. Numerous surgical clips project over the left breast and left axilla, likely from prior lumpectomy and lymph node dissection. IMPRESSION: 1. Support apparatus, as above. 2. No radiographic evidence of acute cardiopulmonary disease. 3. Aortic atherosclerosis. Electronically Signed   By: Trudie Reed M.D.   On: 02/15/2023 08:15   DG Chest Port 1 View  Result Date: 02/13/2023 CLINICAL DATA:  Fever EXAM: PORTABLE CHEST 1 VIEW COMPARISON:  02/09/2023 FINDINGS: PICC on the right with tip at the upper cavoatrial junction. Bilateral interstitial coarsening from likely from airway thickening. There is no edema, consolidation, effusion, or pneumothorax. Normal heart size and mediastinal contours. Remote right mid clavicle fracture with healing. IMPRESSION: Mild bronchitic markings.  No focal pneumonia. Electronically Signed   By: Tiburcio Pea M.D.   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 She presents with breakthrough seizures in the setting of medication noncompliance due to changes in insurance  causing delay in receiving her Vimpat.  EEG to evaluate for seizure  Level of alertness: Awake, asleep  AEDs during EEG study: LCM, PHT, GBP, Onfi  Technical aspects: This EEG study was done with scalp electrodes positioned according to the 10-20 International system of electrode placement. Electrical activity was reviewed  with band pass filter of 1-70Hz , sensitivity of 7 uV/mm, display speed of 62mm/sec with a 60Hz  notched filter applied as appropriate. EEG data were recorded continuously and digitally stored.  Video monitoring was available and reviewed as appropriate.  Description: The posterior dominant rhythm consists of 8-9 Hz activity of moderate voltage (25-35 uV) seen predominantly in posterior head regions, symmetric and reactive to eye opening and eye closing. Sleep was characterized by sleep spindles (12-14hz ), maximal fronto-central region. EEG showed continuous polymorphic sharply contoured rhythmic 3 to 6 Hz theta-delta slowing in right hemisphere, maximal right posterior quadrant. Abundant sharp waves were noted in right posterior quadrant. Hyperventilation and photic stimulation were not performed.   ABNORMALITY - Sharp waves, right posterior quadrant - Continuous rhythmic slow, right hemisphere, maximal right posterior quadrant  IMPRESSION: This study showed evidence of epileptogenicity and cortical dysfunction arising from right hemisphere, maximal right posterior quadrant. No seizures were noted.  Charlsie Quest     Signature  -   Susa Raring M.D on 02/16/2023 at 7:36 AM   -  To page go to [email or web address]

## 2023-02-16 NOTE — Progress Notes (Signed)
[facility] night cross cover note:   I was notified by RN that the patient is experiencing some mild shortness of breath and chest tightness that the patient notes is consistent with her underlying asthma, for which she conveys that she is on albuterol as an outpatient.  No additional associated symptoms at this time.  Her oxygen saturations are in the low 90s on room air, prompting placement of 2 L nasal cannula.  Vital signs otherwise appear stable.  She was noted to have a fever during dayshift, but is currently afebrile with most recently documented temperature 98.9.  Blood pressure 106/64, with respiratory rate 18.   I have ordered prn duo nebulizer treatments, and requested that she receive an associated treatment at this time.  I also ordered stat EKG, chest x-ray.   Update: RN conveys that the patient's sob, chest tightness have resolved with breathing tx. EKG shows NSR with HR 67 and no acute ischemic changes. CXR shows no acute CP process.    Newton Pigg, DO Hospitalist

## 2023-02-17 ENCOUNTER — Inpatient Hospital Stay (HOSPITAL_COMMUNITY): Payer: Medicare HMO

## 2023-02-17 ENCOUNTER — Encounter: Payer: Self-pay | Admitting: Hematology and Oncology

## 2023-02-17 ENCOUNTER — Telehealth: Payer: Self-pay | Admitting: Radiation Oncology

## 2023-02-17 DIAGNOSIS — Z86718 Personal history of other venous thrombosis and embolism: Secondary | ICD-10-CM

## 2023-02-17 DIAGNOSIS — R569 Unspecified convulsions: Secondary | ICD-10-CM | POA: Diagnosis not present

## 2023-02-17 LAB — C-REACTIVE PROTEIN: CRP: 20 mg/dL — ABNORMAL HIGH (ref <1.0)

## 2023-02-17 LAB — CBC WITH DIFFERENTIAL/PLATELET
Abs Immature Granulocytes: 0.02 10*3/uL (ref 0.00–0.07)
Basophils Absolute: 0 10*3/uL (ref 0.0–0.1)
Basophils Relative: 0 %
Eosinophils Absolute: 0.1 10*3/uL (ref 0.0–0.5)
Eosinophils Relative: 1 %
HCT: 30.3 % — ABNORMAL LOW (ref 36.0–46.0)
Hemoglobin: 9.7 g/dL — ABNORMAL LOW (ref 12.0–15.0)
Immature Granulocytes: 1 %
Lymphocytes Relative: 27 %
Lymphs Abs: 1.1 10*3/uL (ref 0.7–4.0)
MCH: 33.4 pg (ref 26.0–34.0)
MCHC: 32 g/dL (ref 30.0–36.0)
MCV: 104.5 fL — ABNORMAL HIGH (ref 80.0–100.0)
Monocytes Absolute: 0.4 10*3/uL (ref 0.1–1.0)
Monocytes Relative: 10 %
Neutro Abs: 2.5 10*3/uL (ref 1.7–7.7)
Neutrophils Relative %: 61 %
Platelets: 124 10*3/uL — ABNORMAL LOW (ref 150–400)
RBC: 2.9 MIL/uL — ABNORMAL LOW (ref 3.87–5.11)
RDW: 12.3 % (ref 11.5–15.5)
WBC: 4 10*3/uL (ref 4.0–10.5)
nRBC: 0 % (ref 0.0–0.2)

## 2023-02-17 LAB — BRAIN NATRIURETIC PEPTIDE: B Natriuretic Peptide: 220.1 pg/mL — ABNORMAL HIGH (ref 0.0–100.0)

## 2023-02-17 LAB — COMPREHENSIVE METABOLIC PANEL
ALT: 19 U/L (ref 0–44)
AST: 17 U/L (ref 15–41)
Albumin: 2.3 g/dL — ABNORMAL LOW (ref 3.5–5.0)
Alkaline Phosphatase: 54 U/L (ref 38–126)
Anion gap: 8 (ref 5–15)
BUN: 10 mg/dL (ref 8–23)
CO2: 27 mmol/L (ref 22–32)
Calcium: 8.4 mg/dL — ABNORMAL LOW (ref 8.9–10.3)
Chloride: 105 mmol/L (ref 98–111)
Creatinine, Ser: 0.62 mg/dL (ref 0.44–1.00)
GFR, Estimated: >60 mL/min (ref >60)
Glucose, Bld: 103 mg/dL — ABNORMAL HIGH (ref 70–99)
Potassium: 3.2 mmol/L — ABNORMAL LOW (ref 3.5–5.1)
Sodium: 140 mmol/L (ref 135–145)
Total Bilirubin: 0.2 mg/dL — ABNORMAL LOW (ref 0.3–1.2)
Total Protein: 5.7 g/dL — ABNORMAL LOW (ref 6.5–8.1)

## 2023-02-17 LAB — MAGNESIUM: Magnesium: 2.1 mg/dL (ref 1.7–2.4)

## 2023-02-17 LAB — PROCALCITONIN: Procalcitonin: <0.1 ng/mL

## 2023-02-17 MED ORDER — HEPARIN (PORCINE) 25000 UT/250ML-% IV SOLN
1350.0000 [IU]/h | INTRAVENOUS | Status: DC
  Administered 2023-02-17: 950 [IU]/h via INTRAVENOUS
  Filled 2023-02-17 (×2): qty 250

## 2023-02-17 MED ORDER — TOPIRAMATE 25 MG PO TABS
50.0000 mg | ORAL_TABLET | Freq: Two times a day (BID) | ORAL | Status: DC
  Administered 2023-02-17 – 2023-02-19 (×4): 50 mg via ORAL
  Filled 2023-02-17 (×4): qty 2

## 2023-02-17 MED ORDER — ALTEPLASE 2 MG IJ SOLR
2.0000 mg | Freq: Once | INTRAMUSCULAR | Status: AC
  Administered 2023-02-17: 2 mg
  Filled 2023-02-17: qty 2

## 2023-02-17 MED ORDER — ALUM & MAG HYDROXIDE-SIMETH 200-200-20 MG/5ML PO SUSP
15.0000 mL | Freq: Once | ORAL | Status: AC
  Administered 2023-02-17: 15 mL via ORAL
  Filled 2023-02-17: qty 30

## 2023-02-17 MED ORDER — DILTIAZEM HCL 30 MG PO TABS
30.0000 mg | ORAL_TABLET | Freq: Two times a day (BID) | ORAL | Status: DC
  Administered 2023-02-18 – 2023-02-19 (×2): 30 mg via ORAL
  Filled 2023-02-17 (×4): qty 1

## 2023-02-17 MED ORDER — POTASSIUM CHLORIDE CRYS ER 20 MEQ PO TBCR
40.0000 meq | EXTENDED_RELEASE_TABLET | Freq: Once | ORAL | Status: AC
  Administered 2023-02-17: 40 meq via ORAL
  Filled 2023-02-17: qty 2

## 2023-02-17 NOTE — Telephone Encounter (Signed)
I checked in with pt's daughter who advised pt still currently hospitalized and somewhat stable. She is unsure at this time when pt will be released. I advised that I will keep an eye out for when she is no longer inpatient to call and r/s XRT appt.  She also stated concern with Med[facility]c appt from 7/12 being marked "no show" when she called several times and left several messages about canceling due to hospitalization. I advise she send a mychart message to help correct this.

## 2023-02-17 NOTE — Care Management Important Message (Signed)
Important Message  Patient Details  Name: [name] MRN: [id number] Date of Birth: 04-18-1956   Medicare Important Message Given:  Yes     Dorena Bodo 02/17/2023, 2:28 PM

## 2023-02-17 NOTE — Progress Notes (Signed)
Inpatient Rehabilitation Admissions Coordinator   I met with patient at bedside. She complains of aching all over. Febrile in past 24 hrs. I have received insurance approval, but await medical readiness for possible admit to CIR. I spoke with her daughter by phone and reviewed estimated cost of care if admitted to CIR. She will discuss with her Mom. I will follow up.  Ottie Glazier, RN, MSN Rehab Admissions Coordinator [phone] 02/17/2023 11:17 AM

## 2023-02-17 NOTE — Progress Notes (Signed)
RUE venous duplex has been completed.  Preliminary findings given to Dr. Thedore Mins.    Results can be found under chart review under CV PROC. 02/17/2023 5:21 PM Najla Aughenbaugh RVT, RDMS

## 2023-02-17 NOTE — Progress Notes (Signed)
ANTICOAGULATION CONSULT NOTE  Pharmacy Consult for Heparin Indication: DVT  Allergies  Allergen Reactions   Zonegran [Zonisamide] Other (See Comments)    Numbness and tingling over whole body   Chocolate Other (See Comments)    Triggers seizures   Codeine Nausea And Vomiting   Keppra [Levetiracetam] Other (See Comments)    Causes seizures   Lyrica [Pregabalin] Nausea And Vomiting   Olive Oil Rash    Patient Measurements: Weight: 56.4 kg (124 lb 5.4 oz) Heparin Dosing Weight: TBW  Vital Signs: Temp: 98.3 F (36.8 C) (07/16 1155) Temp Source: Oral (07/16 0800) BP: 119/62 (07/16 1400) Pulse Rate: 96 (07/16 1400)  Labs: Recent Labs    02/15/23 0844 02/16/23 0410 02/17/23 0358  HGB 11.1* 9.3* 9.7*  HCT 33.2* 29.1* 30.3*  PLT 109* 108* 124*  CREATININE 0.76 0.73 0.62    Estimated Creatinine Clearance: 63.3 mL/min (by C-G formula based on SCr of 0.62 mg/dL).   Medical History: Past Medical History:  Diagnosis Date   Acute bronchitis 08/06/2022   Acute respiratory failure with hypoxia (HCC) 03/31/2015   Altered mental status    Asthma    Ataxia 08/26/2019   Atypical chest pain 02/17/2017   Cerebral hemorrhage (HCC) 1989   Chronic back pain    Closed fracture of distal end of left radius 07/17/2022   Closed fracture of fifth metatarsal bone 01/04/2019   Closed fracture of proximal phalanx of great toe 01/04/2019   Closed nondisplaced fracture of styloid process of left radius 08/06/2022   COPD (chronic obstructive pulmonary disease) (HCC)    no meds per daughter   COPD with exacerbation (HCC) 03/30/2015   Costochondritis 02/17/2017   COVID-19 virus infection 04/02/2020   DDD (degenerative disc disease) 06/12/2013   DVT (deep venous thrombosis) (HCC)    "she's had several since 1990"   GERD (gastroesophageal reflux disease)    no meds per daughter   Headache    "usually around time when she's had a seizure"   History of blood transfusion    "when she was  a baby"   History of kidney stones    History of stomach ulcers    Hypoxia 01/18/2015   Lactic acidosis 04/02/2020   Neuropathy    Seizures (HCC)    "because of her brain surgery"  last one 07/28/22   Sepsis secondary to UTI (HCC) 01/17/2015   Stroke [facility]) 1990's   family denies residual on 11/09/2014   Tunnel vision    "since brain OR"     Assessment: 67 year old female with a 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 US findings consistent with acute deep vein thrombosis involving the right axillary vein. Previously therapeutic on heparin 950 units/hr.   Goal of Therapy:  Heparin level 0.3-0.5 units/ml Monitor platelets by anticoagulation protocol: Yes   Plan:  Start heparin infusion at 950 units/hr, no bolus  Check heparin level in 8 hours and daily while on heparin Continue to monitor H&H and platelets  Thank you for allowing pharmacy to be a part of this patient's care.  Thelma Barge, PharmD Clinical Pharmacist

## 2023-02-17 NOTE — Progress Notes (Signed)
DL PICC flushed with slight resistance and no blood return. tPA instilled in both ports, dead end cap placed, will reassess in 2 hours. Nurse made aware. [name]

## 2023-02-17 NOTE — Progress Notes (Signed)
Physical Therapy Treatment Patient Details Name: [name] MRN: [id number] DOB: [birth year] Today's Date: 02/17/2023   History of Present Illness 66 y.o. female presents to [facility] hospital on 02/09/2023 after multiple seizures at home. Pt with another seizure on morning of 7/9. PMH: cerebral hemorrhage 1989, asthma, COPD, DVT, GERD, seizures, CVA, tunnel vision, neuropathy    PT Comments  Pt continues to be limited by decreased activity tolerance, poor balance/postural reactions and impaired cognition, however making steady progress towards acute goals this session. Pt continues to require up to min A for gait with RW for support for safety and environmental negotiation. Pt with improved impulsivity this session with initiating mobility however pt with tendency to move quickly once up and OOB needing cues to slow and for increased awareness of surrounding environment and task at hand. Current plan remains appropriate to address deficits and maximize functional independence and decrease caregiver burden. Pt continues to benefit from skilled PT services to progress toward functional mobility goals.      Assistance Recommended at Discharge Frequent or constant Supervision/Assistance  If plan is discharge home, recommend the following:  Can travel by private vehicle    Assistance with cooking/housework;Direct supervision/assist for medications management;Direct supervision/assist for financial management;Assist for transportation;Help with stairs or ramp for entrance;A little help with walking and/or transfers;A little help with bathing/dressing/bathroom      Equipment Recommendations  BSC/3in1    Recommendations for Other Services       Precautions / Restrictions Precautions Precautions: Fall Restrictions Weight Bearing Restrictions: No     Mobility  Bed Mobility Overal bed mobility: Needs Assistance Bed Mobility: Supine to Sit, Sit to Supine     Supine to sit: Supervision, HOB  elevated Sit to supine: Supervision   General bed mobility comments: Supervision for safety, assisted with lines/leads.    Transfers Overall transfer level: Needs assistance Equipment used: Rolling walker (2 wheels) Transfers: Sit to/from Stand, Bed to chair/wheelchair/BSC Sit to Stand: Min assist           General transfer comment: cues for safety    Ambulation/Gait Ambulation/Gait assistance: Min assist Gait Distance (Feet): 60 Feet Assistive device: Rolling walker (2 wheels) Gait Pattern/deviations: Step-through pattern, Decreased stride length, Drifts right/left, Staggering right, Trunk flexed Gait velocity: reduced     General Gait Details: Impulsive with movements, erratic RW control. Min assist for RW control in congested areas and with turns. Intermittent VC for awareness and walker placement, left drift this session   Stairs             Wheelchair Mobility     Tilt Bed    Modified Rankin (Stroke Patients Only)       Balance Overall balance assessment: Needs assistance Sitting-balance support: No upper extremity supported, Feet supported Sitting balance-Leahy Scale: Fair     Standing balance support: Single extremity supported, Bilateral upper extremity supported, During functional activity, Reliant on assistive device for balance Standing balance-Leahy Scale: Poor Standing balance comment: UE support in standing                            Cognition Arousal/Alertness: Awake/alert Behavior During Therapy: Flat affect, Impulsive, Anxious, Restless Overall Cognitive Status: Impaired/Different from baseline Area of Impairment: Orientation, Attention, Memory, Following commands, Safety/judgement, Awareness, Problem solving                     Memory: Decreased recall of precautions, Decreased short-term memory Following Commands: Follows  one step commands consistently, Follows one step commands with increased  time Safety/Judgement: Decreased awareness of safety, Decreased awareness of deficits Awareness: Intellectual Problem Solving: Slow processing, Difficulty sequencing, Requires verbal cues General Comments: pt mildly impulsive, moving quickly and needing cues to slow, pt able to recall situation and seizure, but unable to recall previous therapy sessions        Exercises      General Comments General comments (skin integrity, edema, etc.): VSS on RA      Pertinent Vitals/Pain Pain Assessment Pain Assessment: Faces Faces Pain Scale: Hurts little more Pain Location: "everywhere" Pain Descriptors / Indicators: Sore Pain Intervention(s): Monitored during session, Limited activity within patient's tolerance    Home Living                          Prior Function            PT Goals (current goals can now be found in the care plan section) Acute Rehab PT Goals Patient Stated Goal: to return to prior level of independence PT Goal Formulation: With patient Time For Goal Achievement: 02/25/23 Progress towards PT goals: Progressing toward goals    Frequency    Min 4X/week      PT Plan Current plan remains appropriate    Co-evaluation              AM-PAC PT "6 Clicks" Mobility   Outcome Measure  Help needed turning from your back to your side while in a flat bed without using bedrails?: A Little Help needed moving from lying on your back to sitting on the side of a flat bed without using bedrails?: A Little Help needed moving to and from a bed to a chair (including a wheelchair)?: A Little Help needed standing up from a chair using your arms (e.g., wheelchair or bedside chair)?: A Little Help needed to walk in hospital room?: A Little Help needed climbing 3-5 steps with a railing? : A Lot 6 Click Score: 17    End of Session Equipment Utilized During Treatment: Gait belt Activity Tolerance: Patient tolerated treatment well Patient left: in bed;with  call bell/phone within reach;with bed alarm set Nurse Communication: Mobility status PT Visit Diagnosis: Other abnormalities of gait and mobility (R26.89);Muscle weakness (generalized) (M62.81);Difficulty in walking, not elsewhere classified (R26.2)     Time: 7425-9563 PT Time Calculation (min) (ACUTE ONLY): 22 min  Charges:    $Gait Training: 8-22 mins PT General Charges $$ ACUTE PT VISIT: 1 Visit                     Amun Stemm R. PTA Acute Rehabilitation Services Office: [phone]   Catalina Antigua 02/17/2023, 10:34 AM

## 2023-02-17 NOTE — Progress Notes (Signed)
PROGRESS NOTE                                                                                                                                                                                                             Patient Demographics:    [name], is a 67 y.o. female, DOB - [birth year], WUJ:[id number]  Outpatient Primary MD for the patient is [name], FNP    LOS - 7  Admit date - 02/09/2023    Chief Complaint  Patient presents with   Seizures       Brief Narrative (HPI from H&P)      67 y.o. female with medical history significant of thrombocytopenia, paroxysmal A-fib, hypertension, hyperlipidemia, GERD, DVT, chronic pain, COPD, anxiety, hemorrhagic stroke, stress incontinence, seizure disorder, neuropathy, breast cancer presenting after multiple seizures at home.  Orting to the family members patient was compliant with her Dilantin dose and was taking it on a daily basis however she did run out of Vimpat few days ago due to insurance problems, in the ER Dilantin levels were low, she was seen by neurology and admitted for ongoing seizures.   Subjective:   Patient in bed mild generalized headache, neck pain much improved, no chest pain cough or shortness of breath, no abdominal pain or diarrhea, no new joint pains or aches, overall feeling better, small upper lip bite during the night.   Assessment  & Plan :    Breakthrough seizures.  Ran out of Vimpat but was taking Dilantin as prescribed, Dilantin levels were still low, seen by neurology, head CT nonacute, EEG does not show any acute seizures but does show evidence of epileptogenicity and cortical dysfunction, had another seizure morning of 02/10/2023, currently on home dose of Dilantin, cannot increase the dose due to history of side effects with high-dose, continue Vimpat, seizures seems to have improved, defer further management to neurology Case discussed with  seizure MD on 02/17/2023.  Note patient was started on Onfi question if this was causing fevers, this has been discontinued, currently placed on Topamax instead of Onfi, monitor fever curve.  Neurology to reevaluate on 02/17/2023.  History of DVT PE.  Has IVC filter, was not placed on anticoagulation chronically due to intracerebral bleed in the past, chronic left lower extremity DVT for which she already has IVC filter, CTA unremarkable.  Continue to monitor.  Patient would like to refrain from anticoagulation.    History of intracerebral bleed.  No acute issues.  Wants to avoid anticoagulation.    Fever  - no clear source, CT chest abdomen pelvis unremarkable, MRI C-spine with contrast stable as she was having some neck pain, neck pain itself has resolved, lower extremity venous duplex without any clots, blood cultures negative, no white count or procalcitonin, respiratory viral panel negative, strep throat negative, moderate elevation in CRP but looks more nonspecific, she was started on Onfi after which she started having fevers, it has been discontinued.  Continue to monitor fever curve off of Onfi.  She does have a PICC line in her right arm and will check venous ultrasound in her right arm to make sure she does not have a clot there causing fevers.  New onset a flutter on 02/15/2023 with neck pain and fevers.  Fever workup as above, able TSH and echocardiogram, converted to NSR after a short course of Cardizem within few hours of being in A-fib/flutter, [name] vas 2 days greater than 3 however patient wants to refrain from anticoagulation due to history of intracerebral bleed, continue to monitor on low-dose Cardizem orally if stable outpatient cardiology follow-up.  Mild chronic thrombocytopenia.  Stable.  Stress incontinence.  On oxybutynin  Chronic pain.  On tramadol.  Neuropathy.  Lyrica switched to Neurontin, will be discharged on Neurontin.  Hypokalemia.  Replaced  Chronic left lower lung  nodule.  Follow-up with PCP, appears stable for the last 2 years.      Condition - Extremely Guarded  Family Communication  :   Daughter French Ana [phone] - 02/10/23, 02/14/23, 02/17/23  Updated son Sharia Reeve [phone]  on 02/11/2023 and 02/12/2023, 02/14/23, son bedside 02/15/2023, 02/16/2023   Code Status :  Full  Consults  :  Neuro  PUD Prophylaxis :     Procedures  :     Echocardiogram.  1. Left ventricular ejection fraction, by estimation, is 60 to 65%. The left ventricle has normal function. The left ventricle has no regional wall motion abnormalities. Left ventricular diastolic parameters are consistent with Grade I diastolic dysfunction (impaired relaxation).  2. Right ventricular systolic function is normal. The right ventricular size is normal.  3. Left atrial size was mildly dilated.  4. The mitral valve is normal in structure. Trivial mitral valve regurgitation. No evidence of mitral stenosis.  5. The aortic valve was not well visualized. There is mild calcification of the aortic valve. Aortic valve regurgitation is not visualized. Aortic valve sclerosis/calcification is present, without any evidence of aortic stenosis.  6. The inferior vena cava is normal in size with greater than 50% respiratory variability, suggesting right atrial pressure of 3 mmHg.   MRI C-spine.  No evidence of discitis-osteomyelitis or epidural phlegmon/abscess  MRI brain.  No acute intracranial process. No evidence of acute or subacute infarct  CT chest abdomen pelvis with oral and IV contrast. - 1. No acute or focal lesion to explain the patient's sepsis. 2. Stable 5 mm nodule posteriorly in the left lower lobe. Given the stability for over 2 years, this is likely benign. Recommend no imaging follow-up. 3. IVC filter is in place. 4. No evidence for metastatic disease to the chest, abdomen, or pelvis. 5. Aortic Atherosclerosis (ICD10-I70.0) and Emphysema (ICD10-J43.9).   Vascular duplex lower extremity -  chronic deep vein thrombosis involving the left femoral vein  EEG - This study showed one seizure without clinical signs on 02/10/2023 at  1913  arising from  right posterior quadrant, lasting about 45 seconds. Additionally there was evidence of epileptogenicity and cortical dysfunction arising from right hemisphere, maximal right posterior quadrant.    Study has improved significantly compared to previous day.   EEG - This study showed evidence of epileptogenicity and cortical dysfunction arising from right hemisphere, maximal right posterior quadrant. This EEG pattern is on the ictal-interictal continuum with potential for seizures.  CT - 1.  No evidence of an acute intracranial abnormality. 2. 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 Correlate with the surgical history. 3. Right maxillary sinusitis. 4. Small-volume fluid within the left mastoid air cells.      Disposition Plan  :    Status is: inpt  DVT Prophylaxis  :    enoxaparin (LOVENOX) injection 40 mg Start: 02/09/23 1800    Lab Results  Component Value Date   PLT 124 (L) 02/17/2023    Diet :  Diet Order             Diet regular Fluid consistency: Thin  Diet effective now                    Inpatient Medications  Scheduled Meds:  amoxicillin-clavulanate  1 tablet Oral Q12H   Chlorhexidine Gluconate Cloth  6 each Topical Daily   vitamin B-12  2,000 mcg Oral Daily   diltiazem  30 mg Oral Q12H   enoxaparin (LOVENOX) injection  40 mg Subcutaneous Q24H   gabapentin  100 mg Oral TID   ipratropium-albuterol       lacosamide  200 mg Oral BID   oxybutynin  5 mg Oral QHS   phenytoin  100 mg Oral QPM   phenytoin  130 mg Oral Daily   potassium chloride  40 mEq Oral Once   topiramate  25 mg Oral BID   Continuous Infusions:    PRN Meds:.acetaminophen **OR** acetaminophen, diltiazem, haloperidol lactate, ibuprofen,  ipratropium-albuterol, ipratropium-albuterol, LORazepam, LORazepam, melatonin, morphine injection, mouth rinse, polyethylene glycol, traMADol    Objective:   Vitals:   02/17/23 0103 02/17/23 0112 02/17/23 0415 02/17/23 0559  BP:   (!) 100/53 (!) 90/53  Pulse: 71 68 91 78  Resp: 17 19 17 17   Temp:   99.5 F (37.5 C)   TempSrc:   Oral   SpO2: 91% 95% 96% 95%  Weight:        Wt Readings from Last 3 Encounters:  02/09/23 56.4 kg  01/12/23 58.4 kg  12/31/22 59.4 kg     Intake/Output Summary (Last 24 hours) at 02/17/2023 0814 Last data filed at 02/17/2023 0600 Gross per 24 hour  Intake 787.92 ml  Output 1000 ml  Net -212.08 ml     Physical Exam  Awake Alert, No new F.N deficits, Normal affect [place].AT,PERRAL Supple Neck, No JVD,   Symmetrical Chest wall movement, Good air movement bilaterally, CTAB RRR,No Gallops, Rubs or new Murmurs,  +ve B.Sounds, Abd Soft, No tenderness,   No Cyanosis, Clubbing or edema     Data Review:    Recent Labs  Lab 02/11/23 0202 02/12/23 0354 02/13/23 1310 02/14/23 0155 02/15/23 0844 02/16/23 0410 02/17/23 0358  WBC 5.9 6.1 8.2 6.8 6.6 4.2 4.0  HGB 12.3 11.9* 11.7* 11.0* 11.1* 9.3* 9.7*  HCT 36.3 35.8* 35.9* 33.2* 33.2* 29.1* 30.3*  PLT 111* 104* 99* 93* 109* 108* 124*  MCV 100.0 100.3* 101.4* 104.1* 99.7 102.5* 104.5*  MCH 33.9 33.3  33.1 34.5* 33.3 32.7 33.4  MCHC 33.9 33.2 32.6 33.1 33.4 32.0 32.0  RDW 12.2 12.3 12.3 12.4 12.1 12.4 12.3  LYMPHSABS 1.2 0.9  --  1.2  --  1.1 1.1  MONOABS 0.5 0.5  --  0.6  --  0.4 0.4  EOSABS 0.0 0.0  --  0.1  --  0.0 0.1  BASOSABS 0.0 0.0  --  0.0  --  0.0 0.0    Recent Labs  Lab 02/10/23 0956 02/11/23 0202 02/11/23 0202 02/12/23 0354 02/13/23 0406 02/13/23 1310 02/14/23 0155 02/15/23 0844 02/16/23 0410 02/17/23 0358  NA  --  137   < > 139 138  --  136 134* 135 140  K  --  3.0*   < > 3.2* 3.2*  --  4.3 3.3* 3.8 3.2*  CL  --  104   < > 104 99  --  103 96* 104 105  CO2  --  25   < > 27  27  --  26 22 27 27   ANIONGAP  --  8   < > 8 12  --  7 16* 4* 8  GLUCOSE  --  137*   < > 156* 117*  --  108* 125* 100* 103*  BUN  --  6*   < > 5* <5*  --  10 10 10 10   CREATININE  --  0.69   < > 0.64 0.62  --  0.71 0.76 0.73 0.62  AST  --  16  --  16  --   --   --  18 17 17   ALT  --  14  --  13  --   --   --  15 17 19   ALKPHOS  --  45  --  51  --   --   --  48 43 54  BILITOT  --  0.5  --  0.4  --   --   --  0.4 0.4 0.2*  ALBUMIN  --  3.4*  --  3.3*  --   --   --  2.6* 2.4* 2.3*  CRP  --   --   --   --   --   --   --  20.9* 19.2* 20.0*  PROCALCITON  --   --   --   --   --  <0.10  --  0.15 0.10 <0.10  TSH  --   --   --   --   --   --   --  1.576  --   --   AMMONIA 33  --   --   --   --   --   --   --   --   --   BNP  --  38.5  --  22.6  --   --   --   --  94.1 220.1*  MG  --  1.8   < > 1.9 1.8  --   --  1.7 2.0 2.1  CALCIUM  --  8.5*   < > 8.7* 8.8*  --  8.5* 8.6* 8.2* 8.4*   < > = values in this interval not displayed.    Radiology Reports DG Chest Port 1 View  Result Date: 02/16/2023 CLINICAL DATA:  Shortness of breath EXAM: PORTABLE CHEST 1 VIEW COMPARISON:  02/15/2023 FINDINGS: Tip of right PICC line is seen in superior vena cava. Cardiac size is within normal limits. Low position of the  diaphragms suggests COPD. There are no signs of alveolar pulmonary edema or focal pulmonary consolidation. There is no significant pleural effusion or pneumothorax. Surgical clips are seen in left chest wall. Old healed fracture is seen in right clavicle. IMPRESSION: There are no signs of pulmonary edema or focal pulmonary consolidation. Electronically Signed   By: Ernie Avena M.D.   On: 02/16/2023 21:56   MR CERVICAL SPINE W CONTRAST  Result Date: 02/16/2023 CLINICAL DATA:  Fever and neck pain EXAM: MRI CERVICAL SPINE WITH CONTRAST TECHNIQUE: Multiplanar, multisequence MR imaging of the cervical spine was performed following the administration of intravenous contrast. COMPARISON:  Cervical spine  MRI without contrast 02/15/2023 FINDINGS: Postcontrast images show no epidural phlegmon or abscess. No abnormal bone marrow contrast enhancement or other evidence of discitis-osteomyelitis. IMPRESSION: No evidence of discitis-osteomyelitis or epidural phlegmon/abscess. Electronically Signed   By: Deatra Robinson M.D.   On: 02/16/2023 20:06   VAS [place] LOWER EXTREMITY VENOUS (DVT)  Result Date: 02/16/2023  Lower Venous DVT Study Patient Name:  KATIEANN HUNGATE  Date of Exam:   02/15/2023 Medical Rec #: [id number]       Accession #:    [id number] Date of Birth: [birth year]       Patient Gender: F Patient Age:   35 years Exam Location:  [facility] Procedure:      VAS [place] LOWER EXTREMITY VENOUS (DVT) Referring Phys: [facility] --------------------------------------------------------------------------------  Indications: Fever of unkown origin. Other Indications: History of LT lower DVT 10-10-2020. IVC filter placed                    10-12-2020. Comparison Study: Chronic LLE DVT on most recent study 04-28-2022. Performing Technologist: Jean Rosenthal RDMS, RVT  Examination Guidelines: A complete evaluation includes B-mode imaging, spectral Doppler, color Doppler, and power Doppler as needed of all accessible portions of each vessel. Bilateral testing is considered an integral part of a complete examination. Limited examinations for reoccurring indications may be performed as noted. The reflux portion of the exam is performed with the patient in reverse Trendelenburg.  +---------+---------------+---------+-----------+----------+--------------+ RIGHT    CompressibilityPhasicitySpontaneityPropertiesThrombus Aging +---------+---------------+---------+-----------+----------+--------------+ CFV      Full           Yes      Yes                                 +---------+---------------+---------+-----------+----------+--------------+ SFJ      Full                                                         +---------+---------------+---------+-----------+----------+--------------+ FV Prox  Full                                                        +---------+---------------+---------+-----------+----------+--------------+ FV Mid   Full                                                        +---------+---------------+---------+-----------+----------+--------------+  FV DistalFull                                                        +---------+---------------+---------+-----------+----------+--------------+ PFV      Full                                                        +---------+---------------+---------+-----------+----------+--------------+ POP      Full           Yes      Yes                                 +---------+---------------+---------+-----------+----------+--------------+ PTV      Full                                                        +---------+---------------+---------+-----------+----------+--------------+ PERO     Full                                                        +---------+---------------+---------+-----------+----------+--------------+   +---------+---------------+---------+-----------+----------+--------------+ LEFT     CompressibilityPhasicitySpontaneityPropertiesThrombus Aging +---------+---------------+---------+-----------+----------+--------------+ CFV      Full           Yes      Yes                                 +---------+---------------+---------+-----------+----------+--------------+ SFJ      Full                                                        +---------+---------------+---------+-----------+----------+--------------+ FV Prox  Full                                                        +---------+---------------+---------+-----------+----------+--------------+ FV Mid   Full                                                         +---------+---------------+---------+-----------+----------+--------------+ FV DistalFull                                                        +---------+---------------+---------+-----------+----------+--------------+  PFV      Full                                                        +---------+---------------+---------+-----------+----------+--------------+ POP      Full           Yes      Yes                                 +---------+---------------+---------+-----------+----------+--------------+ PTV      Full                                                        +---------+---------------+---------+-----------+----------+--------------+ PERO     Full                                                        +---------+---------------+---------+-----------+----------+--------------+     Summary: RIGHT: - There is no evidence of deep vein thrombosis in the lower extremity.  - No cystic structure found in the popliteal fossa.  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 - No cystic structure found in the popliteal fossa.  *See table(s) above for measurements and observations. Electronically signed by Lemar Livings MD on 02/16/2023 at 7:13:50 PM.    Final    ECHOCARDIOGRAM COMPLETE  Result Date: 02/16/2023    ECHOCARDIOGRAM REPORT   Patient Name:   RALIYAH MONTELLA Date of Exam: 02/16/2023 Medical Rec #:  [id number]      Height:       67.0 in Accession #:    [id number]     Weight:       124.3 lb Date of Birth:  05-Aug-1955      BSA:          1.652 m Patient Age:    66 years       BP:           110/61 mmHg Patient Gender: F              HR:           83 bpm. Exam Location:  Inpatient Procedure: 2D Echo, Cardiac Doppler and Color Doppler Indications:    Atrial fibrillation  History:        Patient has prior history of Echocardiogram examinations, most                 recent 08/03/2020. DVT, COPD and Stroke; Risk                  Factors:Hypertension, Dyslipidemia and Former Smoker. Breast CA.  Sonographer:    Milda Smart Referring Phys: [facility]  Sonographer Comments: Image acquisition challenging due to patient body habitus and Image acquisition challenging due to respiratory motion. IMPRESSIONS  1. Left ventricular ejection fraction, by estimation, is 60 to 65%. The left ventricle has normal function. The left ventricle has no  regional wall motion abnormalities. Left ventricular diastolic parameters are consistent with Grade I diastolic dysfunction (impaired relaxation).  2. Right ventricular systolic function is normal. The right ventricular size is normal.  3. Left atrial size was mildly dilated.  4. The mitral valve is normal in structure. Trivial mitral valve regurgitation. No evidence of mitral stenosis.  5. The aortic valve was not well visualized. There is mild calcification of the aortic valve. Aortic valve regurgitation is not visualized. Aortic valve sclerosis/calcification is present, without any evidence of aortic stenosis.  6. The inferior vena cava is normal in size with greater than 50% respiratory variability, suggesting right atrial pressure of 3 mmHg. FINDINGS  Left Ventricle: Left ventricular ejection fraction, by estimation, is 60 to 65%. The left ventricle has normal function. The left ventricle has no regional wall motion abnormalities. The left ventricular internal cavity size was normal in size. There is  no left ventricular hypertrophy. Left ventricular diastolic parameters are consistent with Grade I diastolic dysfunction (impaired relaxation). Right Ventricle: The right ventricular size is normal. No increase in right ventricular wall thickness. Right ventricular systolic function is normal. Left Atrium: Left atrial size was mildly dilated. Right Atrium: Right atrial size was normal in size. Pericardium: There is no evidence of pericardial effusion. Mitral Valve: The mitral valve is normal in  structure. Trivial mitral valve regurgitation. No evidence of mitral valve stenosis. MV peak gradient, 4.8 mmHg. The mean mitral valve gradient is 2.0 mmHg. Tricuspid Valve: The tricuspid valve is normal in structure. Tricuspid valve regurgitation is trivial. No evidence of tricuspid stenosis. Aortic Valve: The aortic valve was not well visualized. There is mild calcification of the aortic valve. Aortic valve regurgitation is not visualized. Aortic valve sclerosis/calcification is present, without any evidence of aortic stenosis. Pulmonic Valve: The pulmonic valve was normal in structure. Pulmonic valve regurgitation is not visualized. No evidence of pulmonic stenosis. Aorta: The aortic root is normal in size and structure. Venous: The inferior vena cava is normal in size with greater than 50% respiratory variability, suggesting right atrial pressure of 3 mmHg. IAS/Shunts: No atrial level shunt detected by color flow Doppler.  LEFT VENTRICLE PLAX 2D LVIDd:         4.10 cm     Diastology LVIDs:         2.90 cm     LV e' medial:    6.96 cm/s LV PW:         0.90 cm     LV E/e' medial:  12.5 LV IVS:        0.90 cm     LV e' lateral:   10.70 cm/s LVOT diam:     1.90 cm     LV E/e' lateral: 8.1 LV SV:         63 LV SV Index:   38 LVOT Area:     2.84 cm  LV Volumes (MOD) LV vol d, MOD A2C: 56.9 ml LV vol d, MOD A4C: 61.0 ml LV vol s, MOD A2C: 18.7 ml LV vol s, MOD A4C: 17.3 ml LV SV MOD A2C:     38.2 ml LV SV MOD A4C:     61.0 ml LV SV MOD BP:      41.9 ml RIGHT VENTRICLE RV S prime:     15.00 cm/s TAPSE (M-mode): 2.3 cm LEFT ATRIUM             Index        RIGHT ATRIUM  Index LA diam:        2.40 cm 1.45 cm/m   RA Area:     10.90 cm LA Vol (A2C):   23.6 ml 14.28 ml/m  RA Volume:   23.80 ml  14.40 ml/m LA Vol (A4C):   25.3 ml 15.31 ml/m LA Biplane Vol: 26.4 ml 15.98 ml/m  AORTIC VALVE LVOT Vmax:   124.00 cm/s LVOT Vmean:  91.200 cm/s LVOT VTI:    0.223 m  AORTA Ao Root diam: 2.40 cm Ao Asc diam:  2.60 cm  MITRAL VALVE               TRICUSPID VALVE MV Area (PHT): 2.99 cm    TR Peak grad:   13.2 mmHg MV Area VTI:   2.16 cm    TR Vmax:        182.00 cm/s MV Peak grad:  4.8 mmHg MV Mean grad:  2.0 mmHg    SHUNTS MV Vmax:       1.09 m/s    Systemic VTI:  0.22 m MV Vmean:      72.1 cm/s   Systemic Diam: 1.90 cm MV Decel Time: 254 msec MV E velocity: 86.90 cm/s MV A velocity: 62.30 cm/s MV E/A ratio:  1.39 Arvilla Meres MD Electronically signed by Arvilla Meres MD Signature Date/Time: 02/16/2023/1:00:33 PM    Final    MR BRAIN W CONTRAST  Result Date: 02/16/2023 CLINICAL DATA:  Headache, fever. EXAM: MRI HEAD WITH CONTRAST TECHNIQUE: Multiplanar, multiecho pulse sequences of the brain and surrounding structures were obtained with intravenous contrast. CONTRAST:  6mL GADAVIST GADOBUTROL 1 MMOL/ML IV SOLN COMPARISON:  MRI brain February 15, 2023. FINDINGS: Images obtained are partially degraded by motion. Postsurgical changes from right parietooccipital craniotomy with right parietooccipital and posterior temporal encephalomalacia/resection cavity, similar to prior studies. No focus of abnormal contrast enhancement identified. IMPRESSION: No focus of abnormal contrast enhancement to suggest leptomeningeal disease or mass lesion. Electronically Signed   By: Baldemar Lenis M.D.   On: 02/16/2023 09:02   MR CERVICAL SPINE WO CONTRAST  Result Date: 02/15/2023 CLINICAL DATA:  Neck pain EXAM: MRI CERVICAL SPINE WITHOUT CONTRAST TECHNIQUE: Multiplanar, multisequence MR imaging of the cervical spine was performed. No intravenous contrast was administered. COMPARISON:  03/20/2018 MRI cervical spine FINDINGS: Evaluation is significantly motion limited, despite the use of less motion sensitive sequences. Within this limitation, there is disc height loss at C5-C6 and C6-C7, with possible mild spinal canal stenosis at these levels. The axial images are essentially nondiagnostic for neural foraminal narrowing. No  significant listhesis. Preservation of the normal cervical lordosis. IMPRESSION: Significantly motion limited examination. Within this limitation, there is disc height loss at C5-C6 and C6-C7 with possible mild spinal canal stenosis at these levels. The axial images are essentially nondiagnostic for neural foraminal narrowing. Electronically Signed   By: Wiliam Ke M.D.   On: 02/15/2023 19:44   MR BRAIN WO CONTRAST  Result Date: 02/15/2023 CLINICAL DATA:  Headache, fever EXAM: MRI HEAD WITHOUT CONTRAST TECHNIQUE: Multiplanar, multiecho pulse sequences of the brain and surrounding structures were obtained without intravenous contrast. COMPARISON:  08/26/2019 FINDINGS: Evaluation is limited by motion. Brain: No restricted diffusion to suggest acute or subacute infarct. No acute hemorrhage, mass, mass effect, or midline shift. No hydrocephalus or extra-axial collection. Normal pituitary and craniocervical junction. Redemonstrated large area of right parietooccipital encephalomalacia, which communicates with a dilated right lateral ventricle, unchanged. T2 hyperintense signal in the periventricular white matter, likely the sequela  of mild chronic small vessel ischemic disease. Redemonstrated asymmetric right hippocampal volume loss. Vascular: Normal arterial flow voids. Skull and upper cervical spine: Right parietooccipital craniotomy. Otherwise normal marrow signal. Sinuses/Orbits: Mucosal thickening in the right maxillary sinus. No acute finding in the orbits. Other: Chronic left mastoid effusion. Trace fluid in the right mastoid air cells. IMPRESSION: No acute intracranial process. No evidence of acute or subacute infarct. Electronically Signed   By: Wiliam Ke M.D.   On: 02/15/2023 19:44   CT CHEST ABDOMEN PELVIS W CONTRAST  Result Date: 02/15/2023 CLINICAL DATA:  Sepsis.  Personal history of breast cancer. EXAM: CT CHEST, ABDOMEN, AND PELVIS WITH CONTRAST TECHNIQUE: Multidetector CT imaging of the  chest, abdomen and pelvis was performed following the standard protocol during bolus administration of intravenous contrast. RADIATION DOSE REDUCTION: This exam was performed according to the departmental dose-optimization program which includes automated exposure control, adjustment of the mA and/or kV according to patient size and/or use of iterative reconstruction technique. CONTRAST:  75mL OMNIPAQUE IOHEXOL 350 MG/ML SOLN COMPARISON:  CT of the abdomen and pelvis 07/31/2022. CT of the chest, abdomen and pelvis 08/02/2020 FINDINGS: CT CHEST FINDINGS Cardiovascular: The heart size is normal. Aortic valve calcifications are present. Atherosclerotic calcifications are present at the aortic arch and great vessel origins without aneurysm or definite stenosis. Pulmonary arteries are unremarkable. A right-sided PICC line terminates just above the cavoatrial junction. Mediastinum/Nodes: No enlarged mediastinal, hilar, or axillary lymph nodes. Thyroid gland, trachea, and esophagus demonstrate no significant findings. Lungs/Pleura: Centrilobular emphysematous changes are present. The airways are patent. A 5 mm nodule is present posteriorly in the left lower lobe on image 104 of series 4. It is stable for over 2 years. No other nodule or mass lesion is present. No significant airspace disease is present. No significant pleural effusion or pneumothorax is present. Musculoskeletal: The vertebral body heights and alignment are normal. Slight exaggerated kyphosis is stable. No focal osseous lesions are present. CT ABDOMEN PELVIS FINDINGS Hepatobiliary: No focal liver abnormality is seen. No gallstones, gallbladder wall thickening, or biliary dilatation. Pancreas: Unremarkable. No pancreatic ductal dilatation or surrounding inflammatory changes. Spleen: Normal in size without focal abnormality. Adrenals/Urinary Tract: The adrenal glands are normal bilaterally. No stone or mass lesion is present. No obstruction is present.  Ureters are within normal limits. The urinary bladder is normal. Stomach/Bowel: The stomach is within normal limits. A duodenal diverticulum is present without inflammatory change. Small bowel is within normal limits. The terminal ileum is normal. The appendix is not discretely visualized and may be surgically absent. The ascending and transverse colon are within normal limits. The descending and sigmoid colon are within normal limits. Vascular/Lymphatic: Extensive atherosclerotic calcifications are present the aorta and branch vessels. No aneurysm is present. IVC filter is in place. No significant adenopathy is present. Reproductive: Uterus and bilateral adnexa are unremarkable. Other: No abdominal wall hernia or abnormality. No abdominopelvic ascites. Musculoskeletal: Vertebral body heights are normal. No acute or healing fractures are present. No focal osseous lesions are present. The bony pelvis is within normal limits. The hips are located and normal. IMPRESSION: 1. No acute or focal lesion to explain the patient's sepsis. 2. Stable 5 mm nodule posteriorly in the left lower lobe. Given the stability for over 2 years, this is likely benign. Recommend no imaging follow-up. 3. IVC filter is in place. 4. No evidence for metastatic disease to the chest, abdomen, or pelvis. 5. Aortic Atherosclerosis (ICD10-I70.0) and Emphysema (ICD10-J43.9). Electronically Signed   By: Cristal Deer  Mattern M.D.   On: 02/15/2023 11:57   DG Chest Port 1 View  Result Date: 02/15/2023 CLINICAL DATA:  67 year old female with history of shortness of breath. Seizures. EXAM: PORTABLE CHEST 1 VIEW COMPARISON:  Chest x-ray 02/13/2023. FINDINGS: There is a right upper extremity PICC with tip terminating in the distal superior vena cava. Lung volumes are normal. No consolidative airspace disease. No pleural effusions. No pneumothorax. No pulmonary nodule or mass noted. Pulmonary vasculature and the cardiomediastinal silhouette are within  normal limits. Atherosclerosis in the thoracic aorta. Numerous surgical clips project over the left breast and left axilla, likely from prior lumpectomy and lymph node dissection. IMPRESSION: 1. Support apparatus, as above. 2. No radiographic evidence of acute cardiopulmonary disease. 3. Aortic atherosclerosis. Electronically Signed   By: Trudie Reed M.D.   On: 02/15/2023 08:15   DG Chest Port 1 View  Result Date: 02/13/2023 CLINICAL DATA:  Fever EXAM: PORTABLE CHEST 1 VIEW COMPARISON:  02/09/2023 FINDINGS: PICC on the right with tip at the upper cavoatrial junction. Bilateral interstitial coarsening from likely from airway thickening. There is no edema, consolidation, effusion, or pneumothorax. Normal heart size and mediastinal contours. Remote right mid clavicle fracture with healing. IMPRESSION: Mild bronchitic markings.  No focal pneumonia. Electronically Signed   By: Tiburcio Pea M.D.   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 She presents with breakthrough seizures in the setting of medication noncompliance due to changes in insurance causing delay in receiving her Vimpat.  EEG to evaluate for seizure  Level of alertness: Awake, asleep  AEDs during EEG study: LCM, PHT, GBP, Onfi  Technical aspects: This EEG study was done with scalp electrodes positioned according to the 10-20 International system of electrode placement. Electrical activity was reviewed with band pass filter of 1-70Hz , sensitivity of 7 uV/mm, display speed of 42mm/sec with a 60Hz  notched filter applied as appropriate. EEG data  were recorded continuously and digitally stored.  Video monitoring was available and reviewed as appropriate.  Description: The posterior dominant rhythm consists of 8-9 Hz activity of moderate voltage (25-35 uV) seen predominantly in posterior head regions, symmetric and reactive to eye opening and eye closing. Sleep was characterized by sleep spindles (12-14hz ), maximal fronto-central region. EEG showed continuous polymorphic sharply contoured rhythmic 3 to 6 Hz theta-delta slowing in right hemisphere, maximal right posterior quadrant. Abundant sharp waves were noted in right posterior quadrant. Hyperventilation and photic stimulation were not performed.   ABNORMALITY - Sharp waves, right posterior quadrant - Continuous rhythmic slow, right hemisphere, maximal right posterior quadrant  IMPRESSION: This study showed evidence of epileptogenicity and cortical dysfunction arising from right hemisphere, maximal right posterior quadrant. No seizures were noted.  Charlsie Quest     Signature  -   Susa Raring M.D on 02/17/2023 at 8:14 AM   -  To page go to [email or web address]

## 2023-02-17 NOTE — Progress Notes (Signed)
Subjective: Reported having a bite in the left upper side along the gums.  States she gets a bite.  When she has a seizure.  No witnessed seizure overnight.  Also continues to report neck and bacl pain  ROS: negative except above  Examination  Vital signs in last 24 hours: Temp:  [97.7 F (36.5 C)-102.6 F (39.2 C)] 98.3 F (36.8 C) (07/16 1155) Pulse Rate:  [68-174] 174 (07/16 1155) Resp:  [16-25] 25 (07/16 1155) BP: (76-113)/(44-99) 92/60 (07/16 1155) SpO2:  [79 %-99 %] 98 % (07/16 1155)  General: lying in bed, NAD   Neuro: MS: Alert, oriented, follows commands CN: pupils equal and reactive,  EOMI, face symmetric, tongue midline, normal sensation over face, Motor: 5/5 strength in all 4 extremities Reflexes: 2+ bilaterally over patella, biceps, plantars: flexor Coordination: normal Gait: not tested    Basic Metabolic Panel: Recent Labs  Lab 02/12/23 0354 02/13/23 0406 02/14/23 0155 02/15/23 0844 02/16/23 0410 02/17/23 0358  NA 139 138 136 134* 135 140  K 3.2* 3.2* 4.3 3.3* 3.8 3.2*  CL 104 99 103 96* 104 105  CO2 27 27 26 22 27 27   GLUCOSE 156* 117* 108* 125* 100* 103*  BUN 5* <5* 10 10 10 10   CREATININE 0.64 0.62 0.71 0.76 0.73 0.62  CALCIUM 8.7* 8.8* 8.5* 8.6* 8.2* 8.4*  MG 1.9 1.8  --  1.7 2.0 2.1    CBC: Recent Labs  Lab 02/11/23 0202 02/12/23 0354 02/13/23 1310 02/14/23 0155 02/15/23 0844 02/16/23 0410 02/17/23 0358  WBC 5.9 6.1 8.2 6.8 6.6 4.2 4.0  NEUTROABS 4.1 4.6  --  4.8  --  2.6 2.5  HGB 12.3 11.9* 11.7* 11.0* 11.1* 9.3* 9.7*  HCT 36.3 35.8* 35.9* 33.2* 33.2* 29.1* 30.3*  MCV 100.0 100.3* 101.4* 104.1* 99.7 102.5* 104.5*  PLT 111* 104* 99* 93* 109* 108* 124*     Coagulation Studies: No results for input(s): "LABPROT", "INR" in the last 72 hours.  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 She presents with breakthrough seizures in the setting of medication noncompliance due to changes in insurance causing delay in receiving her Vimpat.     Epilepsy with breakthrough seizure - Most likely etiology for her breakthrough seizures is missing vimpat    Recommendations: - Continue home dosing regimen of Vimpat 200mg  bid + dilantin 130mg  in AM and 100mg  in PM (see Dr. Rosalyn Gess note from May 2024, dilantin toxicity has been an issue so regardless of seizure recurrence, DO NOT recommend increasing dilantin even in presence of subtherapeutic levels)  - Neurontin 100 mg TID was added this admission -Switch Onfi to Topamax 25 mg twice daily yesterday because of recurrence of fever.  Unfortunately patient reports gum overnight which she thinks happened due to a seizure.  Although there was no witness seizure, we did make changes to her antiseizure medications yesterday.  Therefore I will go ahead and increase Topamax to 50 mg twice daily -Rest medication: Klonopin oral dissolving tablet 1 mg fortunately for seizure lasting more than 2 minutes.  Intranasal Valtoco not covered by insurance -Follow-up with Dr. Karel Jarvis in 2 to 3 months - PRN IV ativan for clinical seizure while inpatient - Continue seizure precautions -Discussed plan with Dr. Thedore Mins   Fever -No clear etiology for fever so far.  -Given patient's mental status, presentation  as well as MRI findings, I do not think that we need to pursue lumbar puncture at this point -Had held Onfi -Defer further management to primary team  Neck pain -No clear etiology so far.  Could be due to myalgias.  Already on tramadol.  May consider adding baclofen if pain persists  I have spent a total of  36  minutes with the patient reviewing hospital notes,  test results, labs and examining the patient as well as establishing an assessment and plan that was discussed personally with the patient.  > 50% of  time was spent in direct patient care.    Lindie Spruce Epilepsy [facility] For questions after 5pm please refer to AMION to reach the Neurologist on call

## 2023-02-18 ENCOUNTER — Other Ambulatory Visit: Payer: Self-pay

## 2023-02-18 ENCOUNTER — Inpatient Hospital Stay (HOSPITAL_COMMUNITY): Payer: Medicare HMO

## 2023-02-18 ENCOUNTER — Other Ambulatory Visit (HOSPITAL_COMMUNITY): Payer: Self-pay

## 2023-02-18 ENCOUNTER — Telehealth: Payer: Self-pay | Admitting: Pharmacy Technician

## 2023-02-18 DIAGNOSIS — R569 Unspecified convulsions: Secondary | ICD-10-CM | POA: Diagnosis not present

## 2023-02-18 LAB — COMPREHENSIVE METABOLIC PANEL
ALT: 18 U/L (ref 0–44)
AST: 18 U/L (ref 15–41)
Albumin: 2.4 g/dL — ABNORMAL LOW (ref 3.5–5.0)
Alkaline Phosphatase: 56 U/L (ref 38–126)
Anion gap: 8 (ref 5–15)
BUN: 6 mg/dL — ABNORMAL LOW (ref 8–23)
CO2: 22 mmol/L (ref 22–32)
Calcium: 8.4 mg/dL — ABNORMAL LOW (ref 8.9–10.3)
Chloride: 107 mmol/L (ref 98–111)
Creatinine, Ser: 0.63 mg/dL (ref 0.44–1.00)
GFR, Estimated: >60 mL/min (ref >60)
Glucose, Bld: 97 mg/dL (ref 70–99)
Potassium: 3.5 mmol/L (ref 3.5–5.1)
Sodium: 137 mmol/L (ref 135–145)
Total Bilirubin: 0.4 mg/dL (ref 0.3–1.2)
Total Protein: 5.6 g/dL — ABNORMAL LOW (ref 6.5–8.1)

## 2023-02-18 LAB — CBC WITH DIFFERENTIAL/PLATELET
Abs Immature Granulocytes: 0.05 10*3/uL (ref 0.00–0.07)
Basophils Absolute: 0 10*3/uL (ref 0.0–0.1)
Basophils Relative: 0 %
Eosinophils Absolute: 0.1 10*3/uL (ref 0.0–0.5)
Eosinophils Relative: 1 %
HCT: 30.6 % — ABNORMAL LOW (ref 36.0–46.0)
Hemoglobin: 10 g/dL — ABNORMAL LOW (ref 12.0–15.0)
Immature Granulocytes: 1 %
Lymphocytes Relative: 22 %
Lymphs Abs: 1 10*3/uL (ref 0.7–4.0)
MCH: 34 pg (ref 26.0–34.0)
MCHC: 32.7 g/dL (ref 30.0–36.0)
MCV: 104.1 fL — ABNORMAL HIGH (ref 80.0–100.0)
Monocytes Absolute: 0.3 10*3/uL (ref 0.1–1.0)
Monocytes Relative: 7 %
Neutro Abs: 3.1 10*3/uL (ref 1.7–7.7)
Neutrophils Relative %: 69 %
Platelets: 179 10*3/uL (ref 150–400)
RBC: 2.94 MIL/uL — ABNORMAL LOW (ref 3.87–5.11)
RDW: 12.4 % (ref 11.5–15.5)
WBC: 4.6 10*3/uL (ref 4.0–10.5)
nRBC: 0 % (ref 0.0–0.2)

## 2023-02-18 LAB — HEPARIN LEVEL (UNFRACTIONATED)
Heparin Unfractionated: 0.15 IU/mL — ABNORMAL LOW (ref 0.30–0.70)
Heparin Unfractionated: 0.21 IU/mL — ABNORMAL LOW (ref 0.30–0.70)

## 2023-02-18 LAB — BRAIN NATRIURETIC PEPTIDE: B Natriuretic Peptide: 132.8 pg/mL — ABNORMAL HIGH (ref 0.0–100.0)

## 2023-02-18 LAB — C-REACTIVE PROTEIN: CRP: 14.5 mg/dL — ABNORMAL HIGH (ref <1.0)

## 2023-02-18 LAB — PROCALCITONIN: Procalcitonin: <0.1 ng/mL

## 2023-02-18 LAB — MAGNESIUM: Magnesium: 2 mg/dL (ref 1.7–2.4)

## 2023-02-18 MED ORDER — IOHEXOL 350 MG/ML SOLN
75.0000 mL | Freq: Once | INTRAVENOUS | Status: AC | PRN
  Administered 2023-02-18: 75 mL via INTRAVENOUS

## 2023-02-18 MED ORDER — VIMPAT 200 MG PO TABS: 200.0000 mg | ORAL_TABLET | Freq: Two times a day (BID) | ORAL | 5 refills | Status: DC

## 2023-02-18 MED ORDER — POTASSIUM CHLORIDE CRYS ER 20 MEQ PO TBCR
40.0000 meq | EXTENDED_RELEASE_TABLET | Freq: Once | ORAL | Status: AC
  Administered 2023-02-18: 40 meq via ORAL
  Filled 2023-02-18: qty 2

## 2023-02-18 NOTE — Telephone Encounter (Signed)
Pharmacy Patient Advocate Encounter   Received notification from Physician's Office that prior authorization for VIMPAT 200MG  is required/requested.   Insurance verification completed.   The patient is insured through [employer] .   Per test claim: PA submitted to [employer] via CoverMyMeds Key/confirmation #/[id number] Status is pending

## 2023-02-18 NOTE — Progress Notes (Signed)
ANTICOAGULATION CONSULT NOTE  Pharmacy Consult for Heparin Indication: DVT  Allergies  Allergen Reactions   Zonegran [Zonisamide] Other (See Comments)    Numbness and tingling over whole body   Chocolate Other (See Comments)    Triggers seizures   Codeine Nausea And Vomiting   Keppra [Levetiracetam] Other (See Comments)    Causes seizures   Lyrica [Pregabalin] Nausea And Vomiting   Olive Oil Rash    Patient Measurements: Weight: 56.4 kg (124 lb 5.4 oz) Heparin Dosing Weight: TBW  Vital Signs: Temp: 97.5 F (36.4 C) (07/17 1521) BP: 103/90 (07/17 1521) Pulse Rate: 68 (07/17 1521)  Labs: Recent Labs    02/16/23 0410 02/17/23 0358 02/18/23 0539 02/18/23 2046  HGB 9.3* 9.7* 10.0*  --   HCT 29.1* 30.3* 30.6*  --   PLT 108* 124* 179  --   HEPARINUNFRC  --   --  0.15* 0.21*  CREATININE 0.73 0.62 0.63  --     Estimated Creatinine Clearance: 63.3 mL/min (by C-G formula based on SCr of 0.63 mg/dL).   Medical History: Past Medical History:  Diagnosis Date   Acute bronchitis 08/06/2022   Acute respiratory failure with hypoxia (HCC) 03/31/2015   Altered mental status    Asthma    Ataxia 08/26/2019   Atypical chest pain 02/17/2017   Cerebral hemorrhage (HCC) 1989   Chronic back pain    Closed fracture of distal end of left radius 07/17/2022   Closed fracture of fifth metatarsal bone 01/04/2019   Closed fracture of proximal phalanx of great toe 01/04/2019   Closed nondisplaced fracture of styloid process of left radius 08/06/2022   COPD (chronic obstructive pulmonary disease) (HCC)    no meds per daughter   COPD with exacerbation (HCC) 03/30/2015   Costochondritis 02/17/2017   COVID-19 virus infection 04/02/2020   DDD (degenerative disc disease) 06/12/2013   DVT (deep venous thrombosis) (HCC)    "she's had several since 1990"   GERD (gastroesophageal reflux disease)    no meds per daughter   Headache    "usually around time when she's had a seizure"   History  of blood transfusion    "when she was a baby"   History of kidney stones    History of stomach ulcers    Hypoxia 01/18/2015   Lactic acidosis 04/02/2020   Neuropathy    Seizures (HCC)    "because of her brain surgery"  last one 07/28/22   Sepsis secondary to UTI (HCC) 01/17/2015   Stroke [facility]) 1990's   family denies residual on 11/09/2014   Tunnel vision    "since brain OR"     Assessment: 67 year old female with a 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 US findings consistent with acute deep vein thrombosis involving the right axillary vein. Previously therapeutic on heparin 950 units/hr.   Heparin level was finally collected tonight. It came back subtherapeutic. We will increase and check level in AM  Goal of Therapy:  Heparin level 0.3-0.5 units/ml Monitor platelets by anticoagulation protocol: Yes   Plan:  Inc heparin to 1250 units/hr AM heparin level  Ulyses Southward, PharmD, [place], AAHIVP, CPP Infectious Disease Pharmacist 02/18/2023 10:11 PM

## 2023-02-18 NOTE — Progress Notes (Signed)
ANTICOAGULATION CONSULT NOTE  Pharmacy Consult for Heparin Indication: DVT  Allergies  Allergen Reactions   Zonegran [Zonisamide] Other (See Comments)    Numbness and tingling over whole body   Chocolate Other (See Comments)    Triggers seizures   Codeine Nausea And Vomiting   Keppra [Levetiracetam] Other (See Comments)    Causes seizures   Lyrica [Pregabalin] Nausea And Vomiting   Olive Oil Rash    Patient Measurements: Weight: 56.4 kg (124 lb 5.4 oz) Heparin Dosing Weight: TBW  Vital Signs: Temp: 99 F (37.2 C) (07/17 0525) Temp Source: Oral (07/17 0525) BP: 106/54 (07/17 0525) Pulse Rate: 85 (07/17 0525)  Labs: Recent Labs    02/16/23 0410 02/17/23 0358 02/18/23 0539  HGB 9.3* 9.7* 10.0*  HCT 29.1* 30.3* 30.6*  PLT 108* 124* 179  HEPARINUNFRC  --   --  0.15*  CREATININE 0.73 0.62 0.63    Estimated Creatinine Clearance: 63.3 mL/min (by C-G formula based on SCr of 0.63 mg/dL).   Medical History: Past Medical History:  Diagnosis Date   Acute bronchitis 08/06/2022   Acute respiratory failure with hypoxia (HCC) 03/31/2015   Altered mental status    Asthma    Ataxia 08/26/2019   Atypical chest pain 02/17/2017   Cerebral hemorrhage (HCC) 1989   Chronic back pain    Closed fracture of distal end of left radius 07/17/2022   Closed fracture of fifth metatarsal bone 01/04/2019   Closed fracture of proximal phalanx of great toe 01/04/2019   Closed nondisplaced fracture of styloid process of left radius 08/06/2022   COPD (chronic obstructive pulmonary disease) (HCC)    no meds per daughter   COPD with exacerbation (HCC) 03/30/2015   Costochondritis 02/17/2017   COVID-19 virus infection 04/02/2020   DDD (degenerative disc disease) 06/12/2013   DVT (deep venous thrombosis) (HCC)    "she's had several since 1990"   GERD (gastroesophageal reflux disease)    no meds per daughter   Headache    "usually around time when she's had a seizure"   History of blood  transfusion    "when she was a baby"   History of kidney stones    History of stomach ulcers    Hypoxia 01/18/2015   Lactic acidosis 04/02/2020   Neuropathy    Seizures (HCC)    "because of her brain surgery"  last one 07/28/22   Sepsis secondary to UTI (HCC) 01/17/2015   Stroke [facility]) 1990's   family denies residual on 11/09/2014   Tunnel vision    "since brain OR"     Assessment: 67 year old female with a 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 US findings consistent with acute deep vein thrombosis involving the right axillary vein. Previously therapeutic on heparin 950 units/hr.   7/17 AM update:  Heparin level sub-therapeutic   Goal of Therapy:  Heparin level 0.3-0.5 units/ml Monitor platelets by anticoagulation protocol: Yes   Plan:  Inc heparin to 1100 units/hr 1400 heparin level  Abran Duke, PharmD, BCPS Clinical Pharmacist Phone: [phone]

## 2023-02-18 NOTE — Progress Notes (Signed)
Subjective: No seizures overnight.  Was diagnosed with right upper extremity DVT, now on heparin drip.  States she would like to go home before 21st if possible as it is her boyfriend's birthday. Also states her birthday is [birth year]. states neck pain is somewhat improved but not resolved.  ROS: negative except above  Examination  Vital signs in last 24 hours: Temp:  [98.2 F (36.8 C)-99 F (37.2 C)] 98.9 F (37.2 C) (07/17 0807) Pulse Rate:  [80-174] 81 (07/17 0807) Resp:  [15-26] 18 (07/17 0807) BP: (92-120)/(48-73) 120/73 (07/17 0807) SpO2:  [78 %-98 %] 96 % (07/17 0807)  General: lying in bed, NAD   Neuro: MS: Alert, oriented, follows commands CN: pupils equal and reactive,  EOMI, face symmetric, tongue midline, normal sensation over face, Motor: 5/5 strength in all 4 extremities Reflexes: 2+ bilaterally over patella, biceps, plantars: flexor Coordination: normal Gait: not tested  Basic Metabolic Panel: Recent Labs  Lab 02/13/23 0406 02/14/23 0155 02/15/23 0844 02/16/23 0410 02/17/23 0358 02/18/23 0539  NA 138 136 134* 135 140 137  K 3.2* 4.3 3.3* 3.8 3.2* 3.5  CL 99 103 96* 104 105 107  CO2 27 26 22 27 27 22   GLUCOSE 117* 108* 125* 100* 103* 97  BUN <5* 10 10 10 10  6*  CREATININE 0.62 0.71 0.76 0.73 0.62 0.63  CALCIUM 8.8* 8.5* 8.6* 8.2* 8.4* 8.4*  MG 1.8  --  1.7 2.0 2.1 2.0    CBC: Recent Labs  Lab 02/12/23 0354 02/13/23 1310 02/14/23 0155 02/15/23 0844 02/16/23 0410 02/17/23 0358 02/18/23 0539  WBC 6.1   < > 6.8 6.6 4.2 4.0 4.6  NEUTROABS 4.6  --  4.8  --  2.6 2.5 3.1  HGB 11.9*   < > 11.0* 11.1* 9.3* 9.7* 10.0*  HCT 35.8*   < > 33.2* 33.2* 29.1* 30.3* 30.6*  MCV 100.3*   < > 104.1* 99.7 102.5* 104.5* 104.1*  PLT 104*   < > 93* 109* 108* 124* 179   < > = values in this interval not displayed.     Coagulation Studies: No results for input(s): "LABPROT", "INR" in the last 72 hours.  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 She presents with breakthrough seizures in the setting of medication noncompliance due to changes in insurance causing delay in receiving her Vimpat.     Epilepsy with breakthrough seizure - Most likely etiology for her breakthrough seizures is missing vimpat    Recommendations: - Continue home dosing regimen of Vimpat 200mg  bid + dilantin 130mg  in AM and 100mg  in PM (see Dr. Rosalyn Gess note from May 2024, dilantin toxicity has been an issue so regardless of seizure recurrence, DO NOT recommend increasing dilantin even in presence of subtherapeutic levels)  - Neurontin 100 mg TID was added this admission -Continue Topamax 50 mg twice daily (added during this admission) -Rest medication: Klonopin oral dissolving tablet 1 mg fortunately for seizure lasting more than 2 minutes.  Intranasal Valtoco not covered by insurance -Follow-up with Dr. Karel Jarvis in 2 to 3 months - PRN IV ativan for clinical seizure while inpatient - Continue seizure precautions -Discussed plan with Dr. Thedore Mins  Seizure precautions: Per [facility] statutes, patients with seizures are not allowed to drive until they have been seizure-free for six months and cleared by a physician    Use  caution when using heavy equipment or power tools. Avoid working on ladders or at heights. Take showers instead of baths. Ensure the water temperature is not too high on the home water heater. Do not go swimming alone. Do not lock yourself in a room alone (i.e. bathroom). When caring for infants or small children, sit down when holding, feeding, or changing them to minimize risk of injury to the child in the event you have a seizure. Maintain good sleep hygiene. Avoid alcohol.    If patient has another seizure, call 911 and bring them back to the ED if: A.  The seizure lasts longer than 5 minutes.       B.  The patient doesn't wake shortly after the seizure or has new problems such as difficulty seeing, speaking or moving following the seizure C.  The patient was injured during the seizure D.  The patient has a temperature over 102 F (39C) E.  The patient vomited during the seizure and now is having trouble breathing    During the Seizure   - First, ensure adequate ventilation and place patients on the floor on their left side  Loosen clothing around the neck and ensure the airway is patent. If the patient is clenching the teeth, do not force the mouth open with any object as this can cause severe damage - Remove all items from the surrounding that can be hazardous. The patient may be oblivious to what's happening and may not even know what he or she is doing. If the patient is confused and wandering, either gently guide him/her away and block access to outside areas - Reassure the individual and be comforting - Call 911. In most cases, the seizure ends before EMS arrives. However, there are cases when seizures may last over 3 to 5 minutes. Or the individual may have developed breathing difficulties or severe injuries. If a pregnant patient or a person with diabetes develops a seizure, it is prudent to call an ambulance. - Finally, if the patient does not regain full consciousness, then call EMS. Most patients will remain confused for about 45 to 90 minutes after a seizure, so you must use judgment in calling for help.     After the Seizure (Postictal Stage)   After a seizure, most patients experience confusion, fatigue, muscle pain and/or a headache. Thus, one should permit the individual to sleep. For the next few days, reassurance is essential. Being calm and helping reorient the person is also of importance.   Most seizures are painless and end spontaneously. Seizures are not harmful to others but can lead to complications such as stress on the lungs, brain and the heart. Individuals  with prior lung problems may develop labored breathing and respiratory distress.     I have spent a total of  26  minutes with the patient reviewing hospital notes,  test results, labs and examining the patient as well as establishing an assessment and plan that was discussed personally with the patient.  > 50% of time was spent in direct patient care.    Lindie Spruce Epilepsy [facility] For questions after 5pm please refer to AMION to reach the Neurologist on call

## 2023-02-18 NOTE — TOC Progression Note (Signed)
Transition of Care [facility]) - Progression Note    Patient Details  Name: [name] MRN: [id number] Date of Birth: [birth year]  [facility]) CM/SW Contact  Mearl Latin, LCSW Phone Number: 02/18/2023, 9:01 AM  Clinical Narrative:    Awaiting medical stability for CIR admission. Please place [facility] consult if needs arise.      Barriers to Discharge: Continued Medical Work up  Expected Discharge Plan and Services   Discharge Planning Services: CM Consult   Living arrangements for the past 2 months: Single Family Home                                       Social Determinants of Health (SDOH) Interventions SDOH Screenings   Food Insecurity: Patient Unable To Answer (02/10/2023)  Housing: High Risk (02/10/2023)  Transportation Needs: Patient Unable To Answer (02/10/2023)  Utilities: Patient Unable To Answer (02/10/2023)  Financial Resource Strain: Low Risk  (12/31/2022)  Tobacco Use: Medium Risk (01/28/2023)   Received from [facility], [facility]    Readmission Risk Interventions     No data to display

## 2023-02-18 NOTE — Progress Notes (Signed)
Cardizem scheduled for 2200, not given, BP soft. Howerter, MD notified.

## 2023-02-18 NOTE — Progress Notes (Signed)
Occupational Therapy Treatment Patient Details Name: [name] MRN: [id number] DOB: Jan 26, 1956 Today's Date: 02/18/2023   History of present illness 67 y.o. female presents to [facility] hospital on 02/09/2023 after multiple seizures at home. Pt with another seizure on morning of 7/9. PMH: cerebral hemorrhage 1989, asthma, COPD, DVT, GERD, seizures, CVA, tunnel vision, neuropathy   OT comments  Pt presents with improved cognition this session as compared to last skilled OT session. OT educated pt in techniques for increased safety and independence with ADLs and functional transfers/mobility with pt demonstrating ability to complete ADLs with Set up to Min guard assist and functional transfers/mobility with hand held assist +1 with Min guard to Min assist. Pt also instructed in AROM neck stretches to address pt report of stiffness with pt demonstrating understanding of training through teach back. Pt with brief episode of O2 desat into upper 80s on RA during functional mobility in room with pt quickly recovering to >90% O2 sat with standing rest break and pursed lip breathing. Pt participated well in session and is making progress toward OT goals. Pt will benefit from continued acute skilled OT services. Discharge plan of intensive inpatient skilled rehab services > 3 hours per day remains appropriate.    Recommendations for follow up therapy are one component of a multi-disciplinary discharge planning process, led by the attending physician.  Recommendations may be updated based on patient status, additional functional criteria and insurance authorization.    Assistance Recommended at Discharge Frequent or constant Supervision/Assistance  Patient can return home with the following  A little help with walking and/or transfers;A little help with bathing/dressing/bathroom;Assistance with cooking/housework;Direct supervision/assist for medications management;Direct supervision/assist for financial  management;Assist for transportation;Help with stairs or ramp for entrance   Equipment Recommendations  Other (comment) (Defer to next level of care)    Recommendations for Other Services Rehab consult    Precautions / Restrictions Precautions Precautions: Fall Restrictions Weight Bearing Restrictions: No       Mobility Bed Mobility               General bed mobility comments: Pt sitting in recliner at beginning and end of session.    Transfers Overall transfer level: Needs assistance Equipment used: 1 person hand held assist Transfers: Sit to/from Stand, Bed to chair/wheelchair/BSC Sit to Stand: Min guard     Step pivot transfers: Min guard, Min assist     General transfer comment: cues for safety; hand held assist +1 and Min guard to Min assist with gait belt     Balance Overall balance assessment: Needs assistance Sitting-balance support: No upper extremity supported, Feet supported Sitting balance-Leahy Scale: Fair     Standing balance support: Single extremity supported, Bilateral upper extremity supported, No upper extremity supported, During functional activity Standing balance-Leahy Scale: Poor Standing balance comment: Reliant on therapist for balance during functional mobility. Leaned against sink counter during ADLs to maintain balance.                           ADL either performed or assessed with clinical judgement   ADL Overall ADL's : Needs assistance/impaired Eating/Feeding: Modified independent;Sitting   Grooming: Min guard;Standing;Wash/dry hands;Oral care                   Toilet Transfer: Min guard;Minimal assistance;Cueing for safety;Ambulation;Regular Toilet;Grab bars (hand held assist +1 and Min guard to Min assist with gait belt)   Toileting- Clothing Manipulation and Hygiene: Min guard;Cueing  for sequencing;Cueing for safety;Sit to/from stand       Functional mobility during ADLs: Min guard;Minimal  assistance;Cueing for safety;Cueing for sequencing (hand held assist +1 and Min guard to Min assist with gait belt)      Extremity/Trunk Assessment Upper Extremity Assessment Upper Extremity Assessment: Generalized weakness   Lower Extremity Assessment Lower Extremity Assessment: Defer to PT evaluation        Vision       Perception     Praxis      Cognition Arousal/Alertness: Awake/alert Behavior During Therapy: Flat affect, Anxious, Impulsive Overall Cognitive Status: Impaired/Different from baseline Area of Impairment: Attention, Memory, Safety/judgement, Problem solving, Awareness                   Current Attention Level: Sustained Memory: Decreased recall of precautions, Decreased short-term memory Following Commands: Follows one step commands consistently Safety/Judgement: Decreased awareness of safety, Decreased awareness of deficits Awareness: Emergent Problem Solving: Slow processing, Difficulty sequencing, Requires verbal cues General Comments: Pt with improved overall cognition this session as compared to last skilled OT session.        Exercises Exercises: Other exercises Other Exercises Other Exercises: OT instructed pt in AROM neck stretches to address pt reported stiffness  in neck with pt reporting decreased stiffness following AROM stretches.    Shoulder Instructions       General Comments Pt O2 sat briefly desat to upper 80s during functional mobility returning from bathroom. O2 sat recovered quickly to >92% on RA with standing rest break and pursed lip breathing. All other VSS on RA throughout session.    Pertinent Vitals/ Pain       Pain Assessment Pain Assessment: Faces Faces Pain Scale: Hurts whole lot Pain Location: Right foot at end of session; 4 in Right foot at beginning of session Pain Descriptors / Indicators: Aching, Grimacing, Guarding, Sore Pain Intervention(s): Limited activity within patient's tolerance, Monitored during  session, Repositioned, Other (comment) (RN notified)  Home Living                                          Prior Functioning/Environment              Frequency  Min 2X/week        Progress Toward Goals  OT Goals(current goals can now be found in the care plan section)  Progress towards OT goals: Progressing toward goals  Acute Rehab OT Goals Patient Stated Goal: To have less pain in Right foot and to return home with family OT Goal Formulation: With patient Time For Goal Achievement: 02/26/23 Potential to Achieve Goals: Good  Plan Discharge plan remains appropriate    Co-evaluation                 AM-PAC OT "6 Clicks" Daily Activity     Outcome Measure   Help from another person eating meals?: None Help from another person taking care of personal grooming?: A Little Help from another person toileting, which includes using toliet, bedpan, or urinal?: A Little Help from another person bathing (including washing, rinsing, drying)?: A Little Help from another person to put on and taking off regular upper body clothing?: A Little Help from another person to put on and taking off regular lower body clothing?: A Little 6 Click Score: 19    End of Session Equipment Utilized During Treatment: Gait belt  OT Visit Diagnosis: Unsteadiness on feet (R26.81);Other abnormalities of gait and mobility (R26.89);Muscle weakness (generalized) (M62.81)   Activity Tolerance Patient tolerated treatment well;No increased pain;Other (comment) (Pt limited by brief O2 desat with functional mobility)   Patient Left in chair;with call bell/phone within reach;with chair alarm set;with nursing/sitter in room   Nurse Communication Mobility status;Other (comment) (Pt reporting increased pain in Right foot following functional mobility in room. Pt with breif O2 destat with pt recovering quickly.)        Time: 0272-5366 OT Time Calculation (min): 31 min  Charges: OT  General Charges $OT Visit: 1 Visit OT Treatments $Self Care/Home Management : 23-37 mins  Marisa Hufstetler "Orson Eva., OTR/L, MA Acute Rehab [phone]   Lendon Colonel 02/18/2023, 5:06 PM

## 2023-02-18 NOTE — Progress Notes (Signed)
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 patient body habitus and Image acquisition challenging due to respiratory motion. IMPRESSIONS  1. Left ventricular ejection fraction, by estimation, is 60 to 65%. The left ventricle has normal function. The left ventricle has no  regional wall motion abnormalities. Left ventricular diastolic parameters are consistent with Grade I diastolic dysfunction (impaired relaxation).  2. Right ventricular systolic function is normal. The right ventricular size is normal.  3. Left atrial size was mildly dilated.  4. The mitral valve is normal in structure. Trivial mitral valve regurgitation. No evidence of mitral stenosis.  5. The aortic valve was not well visualized. There is mild calcification of the aortic valve. Aortic valve regurgitation is not visualized. Aortic valve  sclerosis/calcification is present, without any evidence of aortic stenosis.  6. The inferior vena cava is normal in size with greater than 50% respiratory variability, suggesting right atrial pressure of 3 mmHg. FINDINGS  Left Ventricle: Left ventricular ejection fraction, by estimation, is 60 to 65%. The left ventricle has normal function. The left ventricle has no regional wall motion abnormalities. The left ventricular internal cavity size was normal in size. There is  no left ventricular hypertrophy. Left ventricular diastolic parameters are consistent with Grade I diastolic dysfunction (impaired relaxation). Right Ventricle: The right ventricular size is normal. No increase in right ventricular wall thickness. Right ventricular systolic function is normal. Left Atrium: Left atrial size was mildly dilated. Right Atrium: Right atrial size was normal in size. Pericardium: There is no evidence of pericardial effusion. Mitral Valve: The mitral valve is normal in structure. Trivial mitral valve regurgitation. No evidence of mitral valve stenosis. MV peak gradient, 4.8 mmHg. The mean mitral valve gradient is 2.0 mmHg. Tricuspid Valve: The tricuspid valve is normal in structure. Tricuspid valve regurgitation is trivial. No evidence of tricuspid stenosis. Aortic Valve: The aortic valve was not well visualized. There is mild calcification of the aortic valve. Aortic valve regurgitation is not visualized. Aortic valve sclerosis/calcification is present, without any evidence of aortic stenosis. Pulmonic Valve: The pulmonic valve was normal in structure. Pulmonic valve regurgitation is not visualized. No evidence of pulmonic stenosis. Aorta: The aortic root is normal in size and structure. Venous: The inferior vena cava is normal in size with greater than 50% respiratory variability, suggesting right atrial pressure of 3 mmHg. IAS/Shunts: No atrial level shunt detected by color flow Doppler.  LEFT VENTRICLE PLAX 2D LVIDd:          4.10 cm     Diastology LVIDs:         2.90 cm     LV e' medial:    6.96 cm/s LV PW:         0.90 cm     LV E/e' medial:  12.5 LV IVS:        0.90 cm     LV e' lateral:   10.70 cm/s LVOT diam:     1.90 cm     LV E/e' lateral: 8.1 LV SV:         63 LV SV Index:   38 LVOT Area:     2.84 cm  LV Volumes (MOD) LV vol d, MOD A2C: 56.9 ml LV vol d, MOD A4C: 61.0 ml LV vol s, MOD A2C: 18.7 ml LV vol s, MOD A4C: 17.3 ml LV SV MOD A2C:     38.2 ml LV SV MOD A4C:     61.0 ml LV SV MOD BP:      41.9 ml RIGHT VENTRICLE RV S prime:     15.00 cm/s TAPSE (M-mode): 2.3 cm LEFT ATRIUM             Index        RIGHT ATRIUM  Index LA diam:        2.40 cm 1.45 cm/m   RA Area:     10.90 cm LA Vol (A2C):   23.6 ml 14.28 ml/m  RA Volume:   23.80 ml  14.40 ml/m LA Vol (A4C):   25.3 ml 15.31 ml/m LA Biplane Vol: 26.4 ml 15.98 ml/m  AORTIC VALVE LVOT Vmax:   124.00 cm/s LVOT Vmean:  91.200 cm/s LVOT VTI:    0.223 m  AORTA Ao Root diam: 2.40 cm Ao Asc diam:  2.60 cm MITRAL VALVE               TRICUSPID VALVE MV Area (PHT): 2.99 cm    TR Peak grad:   13.2 mmHg MV Area VTI:   2.16 cm    TR Vmax:        182.00 cm/s MV Peak grad:  4.8 mmHg MV Mean grad:  2.0 mmHg    SHUNTS MV Vmax:       1.09 m/s    Systemic VTI:  0.22 m MV Vmean:      72.1 cm/s   Systemic Diam: 1.90 cm MV Decel Time: 254 msec MV E velocity: 86.90 cm/s MV A velocity: 62.30 cm/s MV E/A ratio:  1.39 Arvilla Meres MD Electronically signed by Arvilla Meres MD Signature Date/Time: 02/16/2023/1:00:33 PM    Final    MR BRAIN W CONTRAST  Result Date: 02/16/2023 CLINICAL DATA:  Headache, fever. EXAM: MRI HEAD WITH CONTRAST TECHNIQUE: Multiplanar, multiecho pulse sequences of the brain and surrounding structures were obtained with intravenous contrast. CONTRAST:  6mL GADAVIST GADOBUTROL 1 MMOL/ML IV SOLN COMPARISON:  MRI brain February 15, 2023. FINDINGS: Images obtained are partially degraded by motion. Postsurgical changes from right parietooccipital  craniotomy with right parietooccipital and posterior temporal encephalomalacia/resection cavity, similar to prior studies. No focus of abnormal contrast enhancement identified. IMPRESSION: No focus of abnormal contrast enhancement to suggest leptomeningeal disease or mass lesion. Electronically Signed   By: Baldemar Lenis M.D.   On: 02/16/2023 09:02   MR CERVICAL SPINE WO CONTRAST  Result Date: 02/15/2023 CLINICAL DATA:  Neck pain EXAM: MRI CERVICAL SPINE WITHOUT CONTRAST TECHNIQUE: Multiplanar, multisequence MR imaging of the cervical spine was performed. No intravenous contrast was administered. COMPARISON:  03/20/2018 MRI cervical spine FINDINGS: Evaluation is significantly motion limited, despite the use of less motion sensitive sequences. Within this limitation, there is disc height loss at C5-C6 and C6-C7, with possible mild spinal canal stenosis at these levels. The axial images are essentially nondiagnostic for neural foraminal narrowing. No significant listhesis. Preservation of the normal cervical lordosis. IMPRESSION: Significantly motion limited examination. Within this limitation, there is disc height loss at C5-C6 and C6-C7 with possible mild spinal canal stenosis at these levels. The axial images are essentially nondiagnostic for neural foraminal narrowing. Electronically Signed   By: Wiliam Ke M.D.   On: 02/15/2023 19:44   MR BRAIN WO CONTRAST  Result Date: 02/15/2023 CLINICAL DATA:  Headache, fever EXAM: MRI HEAD WITHOUT CONTRAST TECHNIQUE: Multiplanar, multiecho pulse sequences of the brain and surrounding structures were obtained without intravenous contrast. COMPARISON:  08/26/2019 FINDINGS: Evaluation is limited by motion. Brain: No restricted diffusion to suggest acute or subacute infarct. No acute hemorrhage, mass, mass effect, or midline shift. No hydrocephalus or extra-axial collection. Normal pituitary and craniocervical junction. Redemonstrated large area of  right parietooccipital encephalomalacia, which communicates with a dilated right lateral ventricle, unchanged. T2 hyperintense signal in the periventricular white matter, likely the sequela  of mild chronic small vessel ischemic disease. Redemonstrated asymmetric right hippocampal volume loss. Vascular: Normal arterial flow voids. Skull and upper cervical spine: Right parietooccipital craniotomy. Otherwise normal marrow signal. Sinuses/Orbits: Mucosal thickening in the right maxillary sinus. No acute finding in the orbits. Other: Chronic left mastoid effusion. Trace fluid in the right mastoid air cells. IMPRESSION: No acute intracranial process. No evidence of acute or subacute infarct. Electronically Signed   By: Wiliam Ke M.D.   On: 02/15/2023 19:44   CT CHEST ABDOMEN PELVIS W CONTRAST  Result Date: 02/15/2023 CLINICAL DATA:  Sepsis.  Personal history of breast cancer. EXAM: CT CHEST, ABDOMEN, AND PELVIS WITH CONTRAST TECHNIQUE: Multidetector CT imaging of the chest, abdomen and pelvis was performed following the standard protocol during bolus administration of intravenous contrast. RADIATION DOSE REDUCTION: This exam was performed according to the departmental dose-optimization program which includes automated exposure control, adjustment of the mA and/or kV according to patient size and/or use of iterative reconstruction technique. CONTRAST:  75mL OMNIPAQUE IOHEXOL 350 MG/ML SOLN COMPARISON:  CT of the abdomen and pelvis 07/31/2022. CT of the chest, abdomen and pelvis 08/02/2020 FINDINGS: CT CHEST FINDINGS Cardiovascular: The heart size is normal. Aortic valve calcifications are present. Atherosclerotic calcifications are present at the aortic arch and great vessel origins without aneurysm or definite stenosis. Pulmonary arteries are unremarkable. A right-sided PICC line terminates just above the cavoatrial junction. Mediastinum/Nodes: No enlarged mediastinal, hilar, or axillary lymph nodes. Thyroid  gland, trachea, and esophagus demonstrate no significant findings. Lungs/Pleura: Centrilobular emphysematous changes are present. The airways are patent. A 5 mm nodule is present posteriorly in the left lower lobe on image 104 of series 4. It is stable for over 2 years. No other nodule or mass lesion is present. No significant airspace disease is present. No significant pleural effusion or pneumothorax is present. Musculoskeletal: The vertebral body heights and alignment are normal. Slight exaggerated kyphosis is stable. No focal osseous lesions are present. CT ABDOMEN PELVIS FINDINGS Hepatobiliary: No focal liver abnormality is seen. No gallstones, gallbladder wall thickening, or biliary dilatation. Pancreas: Unremarkable. No pancreatic ductal dilatation or surrounding inflammatory changes. Spleen: Normal in size without focal abnormality. Adrenals/Urinary Tract: The adrenal glands are normal bilaterally. No stone or mass lesion is present. No obstruction is present. Ureters are within normal limits. The urinary bladder is normal. Stomach/Bowel: The stomach is within normal limits. A duodenal diverticulum is present without inflammatory change. Small bowel is within normal limits. The terminal ileum is normal. The appendix is not discretely visualized and may be surgically absent. The ascending and transverse colon are within normal limits. The descending and sigmoid colon are within normal limits. Vascular/Lymphatic: Extensive atherosclerotic calcifications are present the aorta and branch vessels. No aneurysm is present. IVC filter is in place. No significant adenopathy is present. Reproductive: Uterus and bilateral adnexa are unremarkable. Other: No abdominal wall hernia or abnormality. No abdominopelvic ascites. Musculoskeletal: Vertebral body heights are normal. No acute or healing fractures are present. No focal osseous lesions are present. The bony pelvis is within normal limits. The hips are located and  normal. IMPRESSION: 1. No acute or focal lesion to explain the patient's sepsis. 2. Stable 5 mm nodule posteriorly in the left lower lobe. Given the stability for over 2 years, this is likely benign. Recommend no imaging follow-up. 3. IVC filter is in place. 4. No evidence for metastatic disease to the chest, abdomen, or pelvis. 5. Aortic Atherosclerosis (ICD10-I70.0) and Emphysema (ICD10-J43.9). Electronically Signed   By: Cristal Deer  Mattern M.D.   On: 02/15/2023 11:57   DG Chest Port 1 View  Result Date: 02/15/2023 CLINICAL DATA:  67 year old female with history of shortness of breath. Seizures. EXAM: PORTABLE CHEST 1 VIEW COMPARISON:  Chest x-ray 02/13/2023. FINDINGS: There is a right upper extremity PICC with tip terminating in the distal superior vena cava. Lung volumes are normal. No consolidative airspace disease. No pleural effusions. No pneumothorax. No pulmonary nodule or mass noted. Pulmonary vasculature and the cardiomediastinal silhouette are within normal limits. Atherosclerosis in the thoracic aorta. Numerous surgical clips project over the left breast and left axilla, likely from prior lumpectomy and lymph node dissection. IMPRESSION: 1. Support apparatus, as above. 2. No radiographic evidence of acute cardiopulmonary disease. 3. Aortic atherosclerosis. Electronically Signed   By: Trudie Reed M.D.   On: 02/15/2023 08:15      Signature  -   Susa Raring M.D on 02/18/2023 at 11:12 AM   -  To page go to [email or web address]

## 2023-02-18 NOTE — Telephone Encounter (Signed)
Pharmacy Patient Advocate Encounter  Received notification from [facility] that Prior Authorization for VIMPAT 200MG  has been APPROVED from 6.1.24 to 12.31.24.Marland Kitchen  PA #/Case ID/Reference #: [id number]

## 2023-02-18 NOTE — Progress Notes (Signed)
 Pt transported to CT ?

## 2023-02-18 NOTE — TOC Benefit Eligibility Note (Signed)
Pharmacy Patient Advocate Encounter  Insurance verification completed.    The patient is insured through [employer]  Ran test claim for Eliquis 5mg  and the current 30 day co-pay is $0.  Ran test claim for Xarelto 20mg  and the current 30 day co-pay is $0.  Ran test claim for Warfarin 5mg  and the current 30 day co-pay is $0.   This test claim was processed through [employer]- copay amounts may vary at other pharmacies due to [employer], or as the patient moves through the different stages of their insurance plan.

## 2023-02-19 ENCOUNTER — Other Ambulatory Visit: Payer: Self-pay

## 2023-02-19 ENCOUNTER — Encounter (HOSPITAL_COMMUNITY): Payer: Self-pay | Admitting: Physical Medicine and Rehabilitation

## 2023-02-19 ENCOUNTER — Inpatient Hospital Stay (HOSPITAL_COMMUNITY): Payer: Medicare HMO

## 2023-02-19 ENCOUNTER — Inpatient Hospital Stay (HOSPITAL_COMMUNITY)
Admission: AD | Admit: 2023-02-19 | Discharge: 2023-02-28 | DRG: 101 | Disposition: A | Discharge status: Home Health | Payer: Medicare HMO | Source: Intra-hospital | Attending: Physical Medicine and Rehabilitation | Admitting: Physical Medicine and Rehabilitation

## 2023-02-19 DIAGNOSIS — Y828 Other medical devices associated with adverse incidents: Secondary | ICD-10-CM | POA: Diagnosis not present

## 2023-02-19 DIAGNOSIS — K5900 Constipation, unspecified: Secondary | ICD-10-CM | POA: Diagnosis present

## 2023-02-19 DIAGNOSIS — Z87440 Personal history of urinary (tract) infections: Secondary | ICD-10-CM

## 2023-02-19 DIAGNOSIS — H5340 Unspecified visual field defects: Secondary | ICD-10-CM | POA: Diagnosis present

## 2023-02-19 DIAGNOSIS — K5901 Slow transit constipation: Secondary | ICD-10-CM | POA: Diagnosis not present

## 2023-02-19 DIAGNOSIS — I4891 Unspecified atrial fibrillation: Secondary | ICD-10-CM | POA: Diagnosis not present

## 2023-02-19 DIAGNOSIS — Z91141 Patient's other noncompliance with medication regimen due to financial hardship: Secondary | ICD-10-CM | POA: Diagnosis not present

## 2023-02-19 DIAGNOSIS — D649 Anemia, unspecified: Secondary | ICD-10-CM | POA: Diagnosis not present

## 2023-02-19 DIAGNOSIS — E778 Other disorders of glycoprotein metabolism: Secondary | ICD-10-CM | POA: Diagnosis present

## 2023-02-19 DIAGNOSIS — Z86718 Personal history of other venous thrombosis and embolism: Secondary | ICD-10-CM | POA: Diagnosis not present

## 2023-02-19 DIAGNOSIS — Z95828 Presence of other vascular implants and grafts: Secondary | ICD-10-CM

## 2023-02-19 DIAGNOSIS — I700 Atherosclerosis of aorta: Secondary | ICD-10-CM | POA: Diagnosis not present

## 2023-02-19 DIAGNOSIS — Z8673 Personal history of transient ischemic attack (TIA), and cerebral infarction without residual deficits: Secondary | ICD-10-CM

## 2023-02-19 DIAGNOSIS — Z800 Family history of malignant neoplasm of digestive organs: Secondary | ICD-10-CM

## 2023-02-19 DIAGNOSIS — I82621 Acute embolism and thrombosis of deep veins of right upper extremity: Secondary | ICD-10-CM | POA: Diagnosis not present

## 2023-02-19 DIAGNOSIS — Z87891 Personal history of nicotine dependence: Secondary | ICD-10-CM | POA: Diagnosis not present

## 2023-02-19 DIAGNOSIS — K219 Gastro-esophageal reflux disease without esophagitis: Secondary | ICD-10-CM | POA: Diagnosis present

## 2023-02-19 DIAGNOSIS — Z9109 Other allergy status, other than to drugs and biological substances: Secondary | ICD-10-CM

## 2023-02-19 DIAGNOSIS — Z8711 Personal history of peptic ulcer disease: Secondary | ICD-10-CM

## 2023-02-19 DIAGNOSIS — Z8049 Family history of malignant neoplasm of other genital organs: Secondary | ICD-10-CM

## 2023-02-19 DIAGNOSIS — Z79899 Other long term (current) drug therapy: Secondary | ICD-10-CM

## 2023-02-19 DIAGNOSIS — R5381 Other malaise: Secondary | ICD-10-CM | POA: Diagnosis not present

## 2023-02-19 DIAGNOSIS — G629 Polyneuropathy, unspecified: Secondary | ICD-10-CM | POA: Diagnosis present

## 2023-02-19 DIAGNOSIS — Z888 Allergy status to other drugs, medicaments and biological substances status: Secondary | ICD-10-CM

## 2023-02-19 DIAGNOSIS — Z8616 Personal history of COVID-19: Secondary | ICD-10-CM

## 2023-02-19 DIAGNOSIS — T82868D Thrombosis of vascular prosthetic devices, implants and grafts, subsequent encounter: Secondary | ICD-10-CM | POA: Diagnosis not present

## 2023-02-19 DIAGNOSIS — E8809 Other disorders of plasma-protein metabolism, not elsewhere classified: Secondary | ICD-10-CM | POA: Diagnosis present

## 2023-02-19 DIAGNOSIS — I4892 Unspecified atrial flutter: Secondary | ICD-10-CM | POA: Diagnosis not present

## 2023-02-19 DIAGNOSIS — C50912 Malignant neoplasm of unspecified site of left female breast: Secondary | ICD-10-CM | POA: Diagnosis not present

## 2023-02-19 DIAGNOSIS — R569 Unspecified convulsions: Secondary | ICD-10-CM | POA: Diagnosis not present

## 2023-02-19 DIAGNOSIS — M549 Dorsalgia, unspecified: Secondary | ICD-10-CM | POA: Diagnosis not present

## 2023-02-19 DIAGNOSIS — R531 Weakness: Secondary | ICD-10-CM | POA: Diagnosis present

## 2023-02-19 DIAGNOSIS — R4189 Other symptoms and signs involving cognitive functions and awareness: Secondary | ICD-10-CM | POA: Diagnosis present

## 2023-02-19 DIAGNOSIS — R32 Unspecified urinary incontinence: Secondary | ICD-10-CM | POA: Diagnosis not present

## 2023-02-19 DIAGNOSIS — R519 Headache, unspecified: Secondary | ICD-10-CM | POA: Diagnosis present

## 2023-02-19 DIAGNOSIS — I82A11 Acute embolism and thrombosis of right axillary vein: Secondary | ICD-10-CM | POA: Diagnosis not present

## 2023-02-19 DIAGNOSIS — M542 Cervicalgia: Secondary | ICD-10-CM | POA: Diagnosis present

## 2023-02-19 DIAGNOSIS — I480 Paroxysmal atrial fibrillation: Secondary | ICD-10-CM | POA: Diagnosis not present

## 2023-02-19 DIAGNOSIS — G40909 Epilepsy, unspecified, not intractable, without status epilepticus: Secondary | ICD-10-CM | POA: Diagnosis not present

## 2023-02-19 DIAGNOSIS — I959 Hypotension, unspecified: Secondary | ICD-10-CM | POA: Diagnosis not present

## 2023-02-19 DIAGNOSIS — J4489 Other specified chronic obstructive pulmonary disease: Secondary | ICD-10-CM | POA: Diagnosis not present

## 2023-02-19 DIAGNOSIS — R109 Unspecified abdominal pain: Secondary | ICD-10-CM | POA: Diagnosis present

## 2023-02-19 DIAGNOSIS — S90111A Contusion of right great toe without damage to nail, initial encounter: Secondary | ICD-10-CM | POA: Diagnosis present

## 2023-02-19 DIAGNOSIS — N3281 Overactive bladder: Secondary | ICD-10-CM | POA: Diagnosis present

## 2023-02-19 DIAGNOSIS — Z802 Family history of malignant neoplasm of other respiratory and intrathoracic organs: Secondary | ICD-10-CM

## 2023-02-19 DIAGNOSIS — Z87442 Personal history of urinary calculi: Secondary | ICD-10-CM

## 2023-02-19 DIAGNOSIS — M79671 Pain in right foot: Secondary | ICD-10-CM

## 2023-02-19 DIAGNOSIS — Z8249 Family history of ischemic heart disease and other diseases of the circulatory system: Secondary | ICD-10-CM

## 2023-02-19 DIAGNOSIS — Z885 Allergy status to narcotic agent status: Secondary | ICD-10-CM

## 2023-02-19 DIAGNOSIS — Z9119 Patient's noncompliance with other medical treatment and regimen due to financial hardship: Secondary | ICD-10-CM | POA: Diagnosis not present

## 2023-02-19 DIAGNOSIS — R001 Bradycardia, unspecified: Secondary | ICD-10-CM | POA: Diagnosis not present

## 2023-02-19 DIAGNOSIS — K4090 Unilateral inguinal hernia, without obstruction or gangrene, not specified as recurrent: Secondary | ICD-10-CM | POA: Diagnosis not present

## 2023-02-19 LAB — URINALYSIS, ROUTINE W REFLEX MICROSCOPIC
Bilirubin Urine: NEGATIVE
Glucose, UA: NEGATIVE mg/dL
Hgb urine dipstick: NEGATIVE
Ketones, ur: NEGATIVE mg/dL
Leukocytes,Ua: NEGATIVE
Nitrite: NEGATIVE
Protein, ur: 30 mg/dL — AB
Specific Gravity, Urine: 1.008 (ref 1.005–1.030)
pH: 7 (ref 5.0–8.0)

## 2023-02-19 LAB — CBC
HCT: 30.5 % — ABNORMAL LOW (ref 36.0–46.0)
Hemoglobin: 10 g/dL — ABNORMAL LOW (ref 12.0–15.0)
MCH: 33.4 pg (ref 26.0–34.0)
MCHC: 32.8 g/dL (ref 30.0–36.0)
MCV: 102 fL — ABNORMAL HIGH (ref 80.0–100.0)
Platelets: 201 10*3/uL (ref 150–400)
RBC: 2.99 MIL/uL — ABNORMAL LOW (ref 3.87–5.11)
RDW: 12.4 % (ref 11.5–15.5)
WBC: 4.5 10*3/uL (ref 4.0–10.5)
nRBC: 0 % (ref 0.0–0.2)

## 2023-02-19 LAB — GLUCOSE, CAPILLARY
Glucose-Capillary: 163 mg/dL — ABNORMAL HIGH (ref 70–99)
Glucose-Capillary: 106 mg/dL — ABNORMAL HIGH (ref 70–99)

## 2023-02-19 LAB — COMPREHENSIVE METABOLIC PANEL
ALT: 18 U/L (ref 0–44)
AST: 15 U/L (ref 15–41)
Albumin: 2.5 g/dL — ABNORMAL LOW (ref 3.5–5.0)
Alkaline Phosphatase: 55 U/L (ref 38–126)
Anion gap: 9 (ref 5–15)
BUN: 8 mg/dL (ref 8–23)
CO2: 22 mmol/L (ref 22–32)
Calcium: 8.5 mg/dL — ABNORMAL LOW (ref 8.9–10.3)
Chloride: 106 mmol/L (ref 98–111)
Creatinine, Ser: 0.68 mg/dL (ref 0.44–1.00)
GFR, Estimated: >60 mL/min (ref >60)
Glucose, Bld: 100 mg/dL — ABNORMAL HIGH (ref 70–99)
Potassium: 3.6 mmol/L (ref 3.5–5.1)
Sodium: 137 mmol/L (ref 135–145)
Total Bilirubin: 0.2 mg/dL — ABNORMAL LOW (ref 0.3–1.2)
Total Protein: 5.8 g/dL — ABNORMAL LOW (ref 6.5–8.1)

## 2023-02-19 LAB — C-REACTIVE PROTEIN: CRP: 12.9 mg/dL — ABNORMAL HIGH (ref <1.0)

## 2023-02-19 LAB — MAGNESIUM: Magnesium: 2.1 mg/dL (ref 1.7–2.4)

## 2023-02-19 LAB — BRAIN NATRIURETIC PEPTIDE: B Natriuretic Peptide: 116 pg/mL — ABNORMAL HIGH (ref 0.0–100.0)

## 2023-02-19 LAB — HEPARIN LEVEL (UNFRACTIONATED): Heparin Unfractionated: 0.23 IU/mL — ABNORMAL LOW (ref 0.30–0.70)

## 2023-02-19 LAB — PROCALCITONIN: Procalcitonin: <0.1 ng/mL

## 2023-02-19 MED ORDER — ONDANSETRON HCL 4 MG PO TABS: 4.0000 mg | ORAL_TABLET | Freq: Four times a day (QID) | ORAL | Status: DC | PRN

## 2023-02-19 MED ORDER — APIXABAN 5 MG PO TABS: 5.0000 mg | ORAL_TABLET | Freq: Two times a day (BID) | ORAL | Status: DC

## 2023-02-19 MED ORDER — SORBITOL 70 % SOLN
30.0000 mL | Freq: Once | Status: AC
  Administered 2023-02-19: 30 mL via ORAL
  Filled 2023-02-19: qty 30

## 2023-02-19 MED ORDER — ACETAMINOPHEN 325 MG PO TABS
325.0000 mg | ORAL_TABLET | ORAL | Status: DC | PRN
  Administered 2023-02-19 – 2023-02-27 (×21): 650 mg via ORAL
  Filled 2023-02-19 (×21): qty 2

## 2023-02-19 MED ORDER — APIXABAN 5 MG PO TABS: ORAL_TABLET | ORAL | Status: DC

## 2023-02-19 MED ORDER — ENOXAPARIN SODIUM 60 MG/0.6ML IJ SOSY
60.0000 mg | PREFILLED_SYRINGE | Freq: Two times a day (BID) | INTRAMUSCULAR | Status: DC
  Administered 2023-02-19 – 2023-02-24 (×10): 60 mg via SUBCUTANEOUS
  Filled 2023-02-19 (×10): qty 0.6

## 2023-02-19 MED ORDER — CLOBAZAM 10 MG PO TABS: 5.0000 mg | ORAL_TABLET | Freq: Every day | ORAL | Status: DC

## 2023-02-19 MED ORDER — FLEET ENEMA 7-19 GM/118ML RE ENEM: 1.0000 | ENEMA | Freq: Once | RECTAL | Status: DC | PRN

## 2023-02-19 MED ORDER — ONDANSETRON HCL 4 MG/2ML IJ SOLN: 4.0000 mg | Freq: Four times a day (QID) | INTRAMUSCULAR | Status: DC | PRN

## 2023-02-19 MED ORDER — APIXABAN 5 MG PO TABS
10.0000 mg | ORAL_TABLET | Freq: Two times a day (BID) | ORAL | Status: DC
  Administered 2023-02-19: 10 mg via ORAL
  Filled 2023-02-19: qty 2

## 2023-02-19 MED ORDER — OXYBUTYNIN CHLORIDE ER 5 MG PO TB24
5.0000 mg | ORAL_TABLET | Freq: Every day | ORAL | Status: DC
  Administered 2023-02-19 – 2023-02-27 (×9): 5 mg via ORAL
  Filled 2023-02-19 (×10): qty 1

## 2023-02-19 MED ORDER — LACOSAMIDE 200 MG PO TABS
200.0000 mg | ORAL_TABLET | Freq: Two times a day (BID) | ORAL | Status: DC
  Administered 2023-02-19 – 2023-02-20 (×2): 200 mg via ORAL
  Filled 2023-02-19 (×3): qty 1

## 2023-02-19 MED ORDER — TOPIRAMATE 25 MG PO TABS
50.0000 mg | ORAL_TABLET | Freq: Two times a day (BID) | ORAL | Status: DC
  Administered 2023-02-19 – 2023-02-20 (×2): 50 mg via ORAL
  Filled 2023-02-19 (×2): qty 2

## 2023-02-19 MED ORDER — WARFARIN SODIUM 5 MG PO TABS
5.0000 mg | ORAL_TABLET | Freq: Once | ORAL | Status: AC
  Administered 2023-02-19: 5 mg via ORAL
  Filled 2023-02-19: qty 1

## 2023-02-19 MED ORDER — DILTIAZEM HCL 30 MG PO TABS: 30.0000 mg | ORAL_TABLET | Freq: Two times a day (BID) | ORAL | Status: DC

## 2023-02-19 MED ORDER — APIXABAN 5 MG PO TABS: 10.0000 mg | ORAL_TABLET | Freq: Two times a day (BID) | ORAL | Status: DC

## 2023-02-19 MED ORDER — WARFARIN - PHARMACIST DOSING INPATIENT: Freq: Every day | Status: DC

## 2023-02-19 MED ORDER — PHENYTOIN SODIUM EXTENDED 30 MG PO CAPS
130.0000 mg | ORAL_CAPSULE | Freq: Every day | ORAL | Status: DC
  Administered 2023-02-20: 130 mg via ORAL
  Filled 2023-02-19 (×3): qty 1

## 2023-02-19 MED ORDER — TOPIRAMATE 50 MG PO TABS: 50.0000 mg | ORAL_TABLET | Freq: Two times a day (BID) | ORAL | Status: DC

## 2023-02-19 MED ORDER — VIMPAT 200 MG PO TABS: 200.0000 mg | ORAL_TABLET | Freq: Two times a day (BID) | ORAL | Status: DC

## 2023-02-19 MED ORDER — ALUM & MAG HYDROXIDE-SIMETH 200-200-20 MG/5ML PO SUSP
30.0000 mL | ORAL | Status: DC | PRN
  Administered 2023-02-24 – 2023-02-27 (×2): 30 mL via ORAL
  Filled 2023-02-19 (×2): qty 30

## 2023-02-19 MED ORDER — VITAMIN B-12 1000 MCG PO TABS
2000.0000 ug | ORAL_TABLET | Freq: Every day | ORAL | Status: DC
  Administered 2023-02-20 – 2023-02-28 (×9): 2000 ug via ORAL
  Filled 2023-02-19 (×9): qty 2

## 2023-02-19 MED ORDER — GUAIFENESIN-DM 100-10 MG/5ML PO SYRP: 10.0000 mL | ORAL_SOLUTION | Freq: Four times a day (QID) | ORAL | Status: DC | PRN

## 2023-02-19 MED ORDER — DILTIAZEM HCL 30 MG PO TABS
30.0000 mg | ORAL_TABLET | Freq: Two times a day (BID) | ORAL | Status: DC
  Administered 2023-02-19 – 2023-02-23 (×9): 30 mg via ORAL
  Filled 2023-02-19 (×16): qty 1

## 2023-02-19 MED ORDER — PHENYTOIN SODIUM EXTENDED 100 MG PO CAPS
100.0000 mg | ORAL_CAPSULE | Freq: Every evening | ORAL | Status: DC
  Administered 2023-02-19 – 2023-02-20 (×2): 100 mg via ORAL
  Filled 2023-02-19 (×2): qty 1

## 2023-02-19 MED ORDER — CHLORHEXIDINE GLUCONATE CLOTH 2 % EX PADS: 6.0000 | MEDICATED_PAD | Freq: Every day | CUTANEOUS | Status: DC

## 2023-02-19 MED ORDER — GABAPENTIN 100 MG PO CAPS
100.0000 mg | ORAL_CAPSULE | Freq: Three times a day (TID) | ORAL | Status: DC
  Administered 2023-02-19 – 2023-02-28 (×26): 100 mg via ORAL
  Filled 2023-02-19 (×26): qty 1

## 2023-02-19 MED ORDER — SORBITOL 70 % SOLN: 30.0000 mL | Freq: Every day | Status: DC | PRN

## 2023-02-19 NOTE — Progress Notes (Signed)
ANTICOAGULATION CONSULT NOTE-Follow Up  Pharmacy Consult discontinue Eliquis due to drug interaction with Phenytoin  Indication: DVT  Allergies  Allergen Reactions   Zonegran [Zonisamide] Other (See Comments)    Numbness and tingling over whole body   Chocolate Other (See Comments)    Triggers seizures   Codeine Nausea And Vomiting   Keppra [Levetiracetam] Other (See Comments)    Causes seizures   Lyrica [Pregabalin] Nausea And Vomiting   Olive Oil Rash    Patient Measurements: Height: 5' 7.75" (172.1 cm) Weight: 60.6 kg (133 lb 9.6 oz) IBW/kg (Calculated) : 63.33 Heparin Dosing Weight: TBW  Vital Signs: Temp: 98.2 F (36.8 C) (07/18 1625) Temp Source: Oral (07/18 1247) BP: 108/49 (07/18 1625) Pulse Rate: 78 (07/18 1625)  Labs: Recent Labs    02/17/23 0358 02/18/23 0539 02/18/23 2046 02/19/23 0612  HGB 9.7* 10.0*  --  10.0*  HCT 30.3* 30.6*  --  30.5*  PLT 124* 179  --  201  HEPARINUNFRC  --  0.15* 0.21* 0.23*  CREATININE 0.62 0.63  --  0.68    Estimated Creatinine Clearance: 68 mL/min (by C-G formula based on SCr of 0.68 mg/dL).   Medical History: Past Medical History:  Diagnosis Date   Acute bronchitis 08/06/2022   Acute respiratory failure with hypoxia (HCC) 03/31/2015   Altered mental status    Asthma    Ataxia 08/26/2019   Atypical chest pain 02/17/2017   Cerebral hemorrhage (HCC) 1989   Chronic back pain    Closed fracture of distal end of left radius 07/17/2022   Closed fracture of fifth metatarsal bone 01/04/2019   Closed fracture of proximal phalanx of great toe 01/04/2019   Closed nondisplaced fracture of styloid process of left radius 08/06/2022   COPD (chronic obstructive pulmonary disease) (HCC)    no meds per daughter   COPD with exacerbation (HCC) 03/30/2015   Costochondritis 02/17/2017   COVID-19 virus infection 04/02/2020   DDD (degenerative disc disease) 06/12/2013   DVT (deep venous thrombosis) (HCC)    "she's had several since  1990"   GERD (gastroesophageal reflux disease)    no meds per daughter   Headache    "usually around time when she's had a seizure"   History of blood transfusion    "when she was a baby"   History of kidney stones    History of stomach ulcers    Hypoxia 01/18/2015   Lactic acidosis 04/02/2020   Neuropathy    Seizures (HCC)    "because of her brain surgery"  last one 07/28/22   Sepsis secondary to UTI (HCC) 01/17/2015   Stroke [facility]) 1990's   family denies residual on 11/09/2014   Tunnel vision    "since brain OR"     Assessment: 67 year old female with a 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  On 7/16 vasc US findings consistent with acute deep vein thrombosis involving the right axillary vein.  Seh was started on IV heparin on 7/16. On 7/18 heparin was stopped and transitioned to oral anticoagulation with Elquis. Eliquis treatment dose started with 10mg  dose given today 7/18 at 12:49PM.    However, since she is also taking Phenytoin (dilantin) for h/o seizures (on PTA),   Eliquis or  Xarelto are not recommended due to phenytoin decreases the concentration /effect of apixaban/rivaroxaban.   I have discussed this with Dr. Shearon Stalls who gave orders  for pharmacy consult to change anticoagulation  to treatment dose Lovenox bridge and Warfarin for RUE DVT.   Weight 60.6 kg,   CrCl 68 ml/min  Goal of Therapy:  Heparin level 0.3-0.5 units/ml Monitor platelets by anticoagulation protocol: Yes   Plan:  Discontinue  Eliquis Lovenox 60mg  SQ q12h until INR =/>2 (x2) Give Warfarin  5mg  x1 tonight Daily INR  CBC q72h  Copay for Warfarin  is $0.00    Thank you for allowing pharmacy to be part of this patients care team.  Noah Delaine, RPh Clinical Pharmacist 02/19/2023 5:05 PM

## 2023-02-19 NOTE — Progress Notes (Signed)
Pt has been sent to CIR, VSS, Education and care plan complete.   Balinda Quails, RN 02/19/2023 4:00 PM
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
## 2023-02-19 NOTE — Discharge Instructions (Addendum)
Do not drive, operate heavy machinery, perform activities at heights, swimming or participation in water activities or provide baby sitting services until you have seen by Primary MD or a Neurologist and advised to do so again.  Follow with Primary MD [name], FNP in 7 days   Get CBC, CMP, 2 view Chest X ray -  checked next visit with your primary MD or SNF MD    Activity: As tolerated with Full fall precautions use walker/cane & assistance as needed  Disposition CIR  Diet: Heart Healthy    Special Instructions: If you have smoked or chewed Tobacco  in the last 2 yrs please stop smoking, stop any regular Alcohol  and or any Recreational drug use.  On your next visit with your primary care physician please Get Medicines reviewed and adjusted.  Please request your Prim.MD to go over all Hospital Tests and Procedure/Radiological results at the follow up, please get all Hospital records sent to your Prim MD by signing hospital release before you go home.  If you experience worsening of your admission symptoms, develop shortness of breath, life threatening emergency, suicidal or homicidal thoughts you must seek medical attention immediately by calling 911 or calling your MD immediately  if symptoms less severe.  You Must read complete instructions/literature along with all the possible adverse reactions/side effects for all the Medicines you take and that have been prescribed to you. Take any new Medicines after you have completely understood and accpet all the possible adverse reactions/side effects.         Information on my medicine - ELIQUIS (apixaban)  This medication education was reviewed with me or my healthcare representative as part of my discharge preparation.   Why was Eliquis prescribed for you? Eliquis was prescribed to treat blood clots that may have been found in the veins of your legs (deep vein thrombosis) or in your lungs (pulmonary embolism) and to reduce the  risk of them occurring again.  What do You need to know about Eliquis ? The starting dose is 10 mg (two 5 mg tablets) taken TWICE daily for the FIRST SEVEN (7) DAYS, then on February 26, 2023 (02-26-2023)  the dose is reduced to ONE 5 mg tablet taken TWICE daily.  Eliquis may be taken with or without food.   Try to take the dose about the same time in the morning and in the evening. If you have difficulty swallowing the tablet whole please discuss with your pharmacist how to take the medication safely.  Take Eliquis exactly as prescribed and DO NOT stop taking Eliquis without talking to the doctor who prescribed the medication.  Stopping may increase your risk of developing a new blood clot.  Refill your prescription before you run out.  After discharge, you should have regular check-up appointments with your healthcare provider that is prescribing your Eliquis.    What do you do if you miss a dose? If a dose of ELIQUIS is not taken at the scheduled time, take it as soon as possible on the same day and twice-daily administration should be resumed. The dose should not be doubled to make up for a missed dose.  Important Safety Information A possible side effect of Eliquis is bleeding. You should call your healthcare provider right away if you experience any of the following: Bleeding from an injury or your nose that does not stop. Unusual colored urine (red or dark brown) or unusual colored stools (red or black). Unusual bruising for  unknown reasons. A serious fall or if you hit your head (even if there is no bleeding).  Some medicines may interact with Eliquis and might increase your risk of bleeding or clotting while on Eliquis. To help avoid this, consult your healthcare provider or pharmacist prior to using any new prescription or non-prescription medications, including herbals, vitamins, non-steroidal anti-inflammatory drugs (NSAIDs) and supplements.  This website has more information  on Eliquis (apixaban): http://[email or web address].com/eliquis/home

## 2023-02-19 NOTE — H&P (Signed)
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 30.0 - 36.0 g/dL    RDW 47.8 29.5 - 62.1 %    Platelets 201 150 - 400 K/uL    nRBC 0.0 0.0 - 0.2 %      Comment: Performed at [facility], [address], [place], [place] [address]  Heparin level (unfractionated)     Status: Abnormal    Collection Time: 02/19/23  6:12 AM  Result Value Ref Range    Heparin Unfractionated 0.23 (L) 0.30 - 0.70 IU/mL      Comment: (NOTE) The clinical reportable range upper limit is being lowered to >1.10 to align with the FDA approved guidance for the current laboratory assay.   If heparin results are below expected  values, and patient dosage has  been confirmed, suggest follow up testing of antithrombin III levels. Performed at [facility], [address] [place], [place] [address]    Glucose, capillary     Status: Abnormal    Collection Time: 02/19/23  8:50 AM  Result Value Ref Range    Glucose-Capillary 163 (H) 70 - 99 mg/dL      Comment: Glucose reference range applies only to samples taken after fasting for at least 8 hours.       Imaging Results (Last 48 hours)  CT ANGIO HEAD NECK W WO CM   Result Date: 02/18/2023 CLINICAL DATA:  DVT with need for anticoagulation. History of ruptured aneurysm in 1989. EXAM: CT ANGIOGRAPHY HEAD AND NECK WITH AND WITHOUT CONTRAST TECHNIQUE: Multidetector CT imaging of the head and neck was performed using the standard protocol during bolus administration of intravenous contrast. Multiplanar CT image reconstructions and MIPs were obtained to evaluate the vascular anatomy. Carotid stenosis measurements (when applicable) are obtained utilizing NASCET criteria, using the distal internal carotid diameter as the denominator. RADIATION DOSE REDUCTION: This exam was performed according to the departmental dose-optimization program which includes automated exposure control, adjustment of the mA and/or kV according to patient size and/or use of iterative reconstruction technique. CONTRAST:  75mL OMNIPAQUE IOHEXOL 350 MG/ML SOLN COMPARISON:  Brain MRI from 3 days ago FINDINGS: CT HEAD FINDINGS Brain: Dense encephalomalacia in the parasagittal right occipital and parietal lobes beneath the craniotomy flap. No evidence of acute infarct, hemorrhage, hydrocephalus, mass, or collection. Vascular: See below Skull: Right posterior craniotomy which is mildly sunken Sinuses/Orbits: No acute finding. Review of the MIP images confirms the above findings CTA NECK FINDINGS Aortic arch: Limited coverage showing atherosclerosis. The brachiocephalic origin is not seen. Right carotid system:  Significant motion artifact. Mild atheromatous plaque without stenosis or ulceration. Left carotid system: Significant motion artifact. Mild to moderate plaque at the bifurcation which is mixed density. No stenosis or ulceration. Vertebral arteries: Proximal subclavian atherosclerosis without significant stenosis or ulceration. The left vertebral artery is dominant. Vertebral arteries are smoothly contoured and widely patent. Skeleton: No acute or aggressive finding Other neck: Motion artifact.  No inflammation or mass. Upper chest: Centrilobular emphysema at the apices. Review of the MIP images confirms the above findings CTA HEAD FINDINGS Anterior circulation: Atheromatous plaque at the carotid siphons. No branch occlusion, beading, or aneurysm. Posterior circulation: The vertebral and basilar arteries are smoothly contoured and widely patent. Diminished flow in peripheral right PCA branches in the setting of dense remote infarct. No aneurysm or signs of vascular malformation. Venous sinuses: Diffusely patent Anatomic variants: None significant Review of the MIP images confirms the above findings IMPRESSION: No emergent finding. Negative for aneurysm or vascular malformation. Electronically Signed   By: Tiburcio Pea M.D.   On: 02/18/2023 05:07    VAS [place] UPPER EXTREMITY VENOUS DUPLEX   Result Date: 02/17/2023 UPPER VENOUS STUDY  Patient Name:  KEMPER HOCHMAN  Date of Exam:   02/17/2023 Medical Rec #: [id number]       Accession #:    [id number] Date of Birth: 05-05-1956       Patient Gender: F Patient Age:   63 years Exam Location:  [facility] Procedure:      VAS [place] UPPER EXTREMITY VENOUS DUPLEX Referring Phys: Bess Harvest [facility] --------------------------------------------------------------------------------  Indications: Fever Other Indications: Recent RUE PICC line placement. Risk Factors: DVT HX of multiple DVTs. Limitations: PICC line placement/bandage. Comparison Study: Previous exam on 08/09/20 was  postive for DVT/SVT in BIL                   brachial veins and RUE basilic vein. Performing Technologist: Ernestene Mention RVT, RDMS  Examination Guidelines: A complete evaluation includes B-mode imaging, spectral Doppler, color Doppler, and power Doppler as needed of all accessible portions of each vessel. Bilateral testing is considered an integral part of a complete examination. Limited examinations for reoccurring indications may be performed as noted.  Right Findings: +----------+------------+---------+-----------+----------+--------------------+ RIGHT     CompressiblePhasicitySpontaneousProperties  Summary        +----------+------------+---------+-----------+----------+--------------------+ IJV           Full       Yes       Yes                                   +----------+------------+---------+-----------+----------+--------------------+ Subclavian    Full       Yes       Yes                                   +----------+------------+---------+-----------+----------+--------------------+ Axillary    Partial      Yes       Yes                     Acute         +----------+------------+---------+-----------+----------+--------------------+ Brachial      Full       Yes       Yes              not well visualized  +----------+------------+---------+-----------+----------+--------------------+ Radial        Full                                                       +----------+------------+---------+-----------+----------+--------------------+ Ulnar         Full                                                       +----------+------------+---------+-----------+----------+--------------------+ Cephalic      Full                                                       +----------+------------+---------+-----------+----------+--------------------+ Basilic       Full                                   only visualized in                                                             forearm        +----------+------------+---------+-----------+----------+--------------------+ Brachial vein note well visualized and upper arm basilic vein not seen due to PICC line placement.  Left Findings: +----------+------------+---------+-----------+----------+-------+ LEFT      CompressiblePhasicitySpontaneousPropertiesSummary +----------+------------+---------+-----------+----------+-------+ Subclavian               Yes       Yes                      +----------+------------+---------+-----------+----------+-------+  Summary:  Right: No evidence of superficial vein thrombosis  in the upper extremity. Findings consistent with acute deep vein thrombosis involving the right axillary vein.  Left: No evidence of thrombosis in the subclavian.  *See table(s) above for measurements and observations.  Diagnosing physician: Waverly Ferrari MD Electronically signed by Waverly Ferrari MD on 02/17/2023 at 5:47:27 PM.    Final            Blood pressure (!) 102/53, pulse 64, temperature 98.6 F (37 C), temperature source Axillary, resp. rate 17, weight 56.4 kg, SpO2 93%.   Medical Problem List and Plan: 1. Functional deficits secondary to seizures- ran out meds due to insurance issues with more cognitive deficits and increased weakness above baseline             -patient may  shower             -ELOS/Goals: 2-2.5 weeks- min A to supervision             Admit to CIR   2.  Antithrombotics: -DVT/anticoagulation:  Pharmaceutical: Cannot have Eliquis or Xarelto due to interaction with Phenytoin- will change to tx dose lovenox and transition to Coumadin.              -antiplatelet therapy: none   3. Pain Management: Tylenol as needed (no tramadol)             -Lidoderm as needed   4. Mood/Behavior/Sleep: LCSW to evaluate and provide emotional support (no trazodone)             -antipsychotic agents: n/a   5. Neuropsych/cognition: This patient is not quite  capable of making decisions on her own behalf.   6. Skin/Wound Care: Routine skin care checks   7. 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 CVA tenderness/flank pain-= CT done- was negative for any issues 17. Constipation- will order Sorbitol 30cc         Milinda Antis, PA-C 02/19/2023   I have personally performed a face to face diagnostic evaluation of this patient and formulated the key components of the plan.  Additionally, I have personally reviewed laboratory data, imaging studies, as well as relevant notes and concur with the physician assistant's documentation above.   The patient's  status has not changed from the original H&P.  Any changes in documentation from the acute care chart have been noted above.

## 2023-02-19 NOTE — Discharge Summary (Signed)
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                                                        +---------+---------------+---------+-----------+----------+--------------+ FV DistalFull                                                        +---------+---------------+---------+-----------+----------+--------------+ PFV      Full                                                        +---------+---------------+---------+-----------+----------+--------------+ POP      Full           Yes      Yes                                 +---------+---------------+---------+-----------+----------+--------------+ PTV      Full                                                         +---------+---------------+---------+-----------+----------+--------------+ PERO     Full                                                        +---------+---------------+---------+-----------+----------+--------------+     Summary: RIGHT: - There is no evidence of deep vein thrombosis in the lower extremity.  - No cystic structure found in the popliteal fossa.  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 - No cystic structure found in the popliteal fossa.  *See table(s) above for measurements and observations. Electronically signed by Lemar Livings MD on 02/16/2023 at 7:13:50 PM.    Final    ECHOCARDIOGRAM COMPLETE  Result Date: 02/16/2023    ECHOCARDIOGRAM REPORT   Patient Name:   [name] Date of Exam: 02/16/2023 Medical Rec #:  [id number]      Height:       67.0 in Accession #:    [id number]     Weight:       124.3 lb Date of Birth:  Dec 19, 1955      BSA:          1.652 m Patient Age:    66 years       BP:           110/61 mmHg Patient Gender: F              HR:           83 bpm. Exam Location:  Inpatient Procedure: 2D Echo, Cardiac Doppler and Color Doppler Indications:    Atrial  fibrillation  History:        Patient has prior history of Echocardiogram examinations, most                 recent 08/03/2020. DVT, COPD and Stroke; Risk                 Factors:Hypertension, Dyslipidemia and Former Smoker. Breast CA.  Sonographer:    Milda Smart Referring Phys: Stanford Scotland [facility]  Sonographer Comments: Image acquisition challenging due to patient body habitus and Image acquisition challenging due to respiratory motion. IMPRESSIONS  1. Left ventricular ejection fraction, by estimation, is 60 to 65%. The left ventricle has normal function. The left ventricle has no regional wall motion abnormalities. Left ventricular diastolic parameters are consistent with Grade I diastolic dysfunction (impaired relaxation).  2. Right ventricular systolic function is  normal. The right ventricular size is normal.  3. Left atrial size was mildly dilated.  4. The mitral valve is normal in structure. Trivial mitral valve regurgitation. No evidence of mitral stenosis.  5. The aortic valve was not well visualized. There is mild calcification of the aortic valve. Aortic valve regurgitation is not visualized. Aortic valve sclerosis/calcification is present, without any evidence of aortic stenosis.  6. The inferior vena cava is normal in size with greater than 50% respiratory variability, suggesting right atrial pressure of 3 mmHg. FINDINGS  Left Ventricle: Left ventricular ejection fraction, by estimation, is 60 to 65%. The left ventricle has normal function. The left ventricle has no regional wall motion abnormalities. The left ventricular internal cavity size was normal in size. There is  no left ventricular hypertrophy. Left ventricular diastolic parameters are consistent with Grade I diastolic dysfunction (impaired relaxation). Right Ventricle: The right ventricular size is normal. No increase in right ventricular wall thickness. Right ventricular systolic function is normal. Left Atrium: Left atrial size was mildly dilated. Right Atrium: Right atrial size was normal in size. Pericardium: There is no evidence of pericardial effusion. Mitral Valve: The mitral valve is normal in structure. Trivial mitral valve regurgitation. No evidence of mitral valve stenosis. MV peak gradient, 4.8 mmHg. The mean mitral valve gradient is 2.0 mmHg. Tricuspid Valve: The tricuspid valve is normal in structure. Tricuspid valve regurgitation is trivial. No evidence of tricuspid stenosis. Aortic Valve: The aortic valve was not well visualized. There is mild calcification of the aortic valve. Aortic valve regurgitation is not visualized. Aortic valve sclerosis/calcification is present, without any evidence of aortic stenosis. Pulmonic Valve: The pulmonic valve was normal in structure. Pulmonic valve  regurgitation is not visualized. No evidence of pulmonic stenosis. Aorta: The aortic root is normal in size and structure. Venous: The inferior vena cava is normal in size with greater than 50% respiratory variability, suggesting right atrial pressure of 3 mmHg. IAS/Shunts: No atrial level shunt detected by color flow Doppler.  LEFT VENTRICLE PLAX 2D LVIDd:         4.10 cm     Diastology LVIDs:         2.90 cm     LV e' medial:    6.96 cm/s LV PW:         0.90 cm     LV E/e' medial:  12.5 LV IVS:        0.90 cm     LV e' lateral:   10.70 cm/s LVOT diam:     1.90 cm     LV E/e' lateral: 8.1 LV SV:  63 LV SV Index:   38 LVOT Area:     2.84 cm  LV Volumes (MOD) LV vol d, MOD A2C: 56.9 ml LV vol d, MOD A4C: 61.0 ml LV vol s, MOD A2C: 18.7 ml LV vol s, MOD A4C: 17.3 ml LV SV MOD A2C:     38.2 ml LV SV MOD A4C:     61.0 ml LV SV MOD BP:      41.9 ml RIGHT VENTRICLE RV S prime:     15.00 cm/s TAPSE (M-mode): 2.3 cm LEFT ATRIUM             Index        RIGHT ATRIUM           Index LA diam:        2.40 cm 1.45 cm/m   RA Area:     10.90 cm LA Vol (A2C):   23.6 ml 14.28 ml/m  RA Volume:   23.80 ml  14.40 ml/m LA Vol (A4C):   25.3 ml 15.31 ml/m LA Biplane Vol: 26.4 ml 15.98 ml/m  AORTIC VALVE LVOT Vmax:   124.00 cm/s LVOT Vmean:  91.200 cm/s LVOT VTI:    0.223 m  AORTA Ao Root diam: 2.40 cm Ao Asc diam:  2.60 cm MITRAL VALVE               TRICUSPID VALVE MV Area (PHT): 2.99 cm    TR Peak grad:   13.2 mmHg MV Area VTI:   2.16 cm    TR Vmax:        182.00 cm/s MV Peak grad:  4.8 mmHg MV Mean grad:  2.0 mmHg    SHUNTS MV Vmax:       1.09 m/s    Systemic VTI:  0.22 m MV Vmean:      72.1 cm/s   Systemic Diam: 1.90 cm MV Decel Time: 254 msec MV E velocity: 86.90 cm/s MV A velocity: 62.30 cm/s MV E/A ratio:  1.39 Arvilla Meres MD Electronically signed by Arvilla Meres MD Signature Date/Time: 02/16/2023/1:00:33 PM    Final    MR BRAIN W CONTRAST  Result Date: 02/16/2023 CLINICAL DATA:  Headache, fever. EXAM:  MRI HEAD WITH CONTRAST TECHNIQUE: Multiplanar, multiecho pulse sequences of the brain and surrounding structures were obtained with intravenous contrast. CONTRAST:  6mL GADAVIST GADOBUTROL 1 MMOL/ML IV SOLN COMPARISON:  MRI brain February 15, 2023. FINDINGS: Images obtained are partially degraded by motion. Postsurgical changes from right parietooccipital craniotomy with right parietooccipital and posterior temporal encephalomalacia/resection cavity, similar to prior studies. No focus of abnormal contrast enhancement identified. IMPRESSION: No focus of abnormal contrast enhancement to suggest leptomeningeal disease or mass lesion. Electronically Signed   By: Baldemar Lenis M.D.   On: 02/16/2023 09:02   MR CERVICAL SPINE WO CONTRAST  Result Date: 02/15/2023 CLINICAL DATA:  Neck pain EXAM: MRI CERVICAL SPINE WITHOUT CONTRAST TECHNIQUE: Multiplanar, multisequence MR imaging of the cervical spine was performed. No intravenous contrast was administered. COMPARISON:  03/20/2018 MRI cervical spine FINDINGS: Evaluation is significantly motion limited, despite the use of less motion sensitive sequences. Within this limitation, there is disc height loss at C5-C6 and C6-C7, with possible mild spinal canal stenosis at these levels. The axial images are essentially nondiagnostic for neural foraminal narrowing. No significant listhesis. Preservation of the normal cervical lordosis. IMPRESSION: Significantly motion limited examination. Within this limitation, there is disc height loss at C5-C6 and C6-C7 with possible mild spinal canal stenosis at these  levels. The axial images are essentially nondiagnostic for neural foraminal narrowing. Electronically Signed   By: Wiliam Ke M.D.   On: 02/15/2023 19:44   MR BRAIN WO CONTRAST  Result Date: 02/15/2023 CLINICAL DATA:  Headache, fever EXAM: MRI HEAD WITHOUT CONTRAST TECHNIQUE: Multiplanar, multiecho pulse sequences of the brain and surrounding structures were  obtained without intravenous contrast. COMPARISON:  08/26/2019 FINDINGS: Evaluation is limited by motion. Brain: No restricted diffusion to suggest acute or subacute infarct. No acute hemorrhage, mass, mass effect, or midline shift. No hydrocephalus or extra-axial collection. Normal pituitary and craniocervical junction. Redemonstrated large area of right parietooccipital encephalomalacia, which communicates with a dilated right lateral ventricle, unchanged. T2 hyperintense signal in the periventricular white matter, likely the sequela of mild chronic small vessel ischemic disease. Redemonstrated asymmetric right hippocampal volume loss. Vascular: Normal arterial flow voids. Skull and upper cervical spine: Right parietooccipital craniotomy. Otherwise normal marrow signal. Sinuses/Orbits: Mucosal thickening in the right maxillary sinus. No acute finding in the orbits. Other: Chronic left mastoid effusion. Trace fluid in the right mastoid air cells. IMPRESSION: No acute intracranial process. No evidence of acute or subacute infarct. Electronically Signed   By: Wiliam Ke M.D.   On: 02/15/2023 19:44   CT CHEST ABDOMEN PELVIS W CONTRAST  Result Date: 02/15/2023 CLINICAL DATA:  Sepsis.  Personal history of breast cancer. EXAM: CT CHEST, ABDOMEN, AND PELVIS WITH CONTRAST TECHNIQUE: Multidetector CT imaging of the chest, abdomen and pelvis was performed following the standard protocol during bolus administration of intravenous contrast. RADIATION DOSE REDUCTION: This exam was performed according to the departmental dose-optimization program which includes automated exposure control, adjustment of the mA and/or kV according to patient size and/or use of iterative reconstruction technique. CONTRAST:  75mL OMNIPAQUE IOHEXOL 350 MG/ML SOLN COMPARISON:  CT of the abdomen and pelvis 07/31/2022. CT of the chest, abdomen and pelvis 08/02/2020 FINDINGS: CT CHEST FINDINGS Cardiovascular: The heart size is normal. Aortic valve  calcifications are present. Atherosclerotic calcifications are present at the aortic arch and great vessel origins without aneurysm or definite stenosis. Pulmonary arteries are unremarkable. A right-sided PICC line terminates just above the cavoatrial junction. Mediastinum/Nodes: No enlarged mediastinal, hilar, or axillary lymph nodes. Thyroid gland, trachea, and esophagus demonstrate no significant findings. Lungs/Pleura: Centrilobular emphysematous changes are present. The airways are patent. A 5 mm nodule is present posteriorly in the left lower lobe on image 104 of series 4. It is stable for over 2 years. No other nodule or mass lesion is present. No significant airspace disease is present. No significant pleural effusion or pneumothorax is present. Musculoskeletal: The vertebral body heights and alignment are normal. Slight exaggerated kyphosis is stable. No focal osseous lesions are present. CT ABDOMEN PELVIS FINDINGS Hepatobiliary: No focal liver abnormality is seen. No gallstones, gallbladder wall thickening, or biliary dilatation. Pancreas: Unremarkable. No pancreatic ductal dilatation or surrounding inflammatory changes. Spleen: Normal in size without focal abnormality. Adrenals/Urinary Tract: The adrenal glands are normal bilaterally. No stone or mass lesion is present. No obstruction is present. Ureters are within normal limits. The urinary bladder is normal. Stomach/Bowel: The stomach is within normal limits. A duodenal diverticulum is present without inflammatory change. Small bowel is within normal limits. The terminal ileum is normal. The appendix is not discretely visualized and may be surgically absent. The ascending and transverse colon are within normal limits. The descending and sigmoid colon are within normal limits. Vascular/Lymphatic: Extensive atherosclerotic calcifications are present the aorta and branch vessels. No aneurysm is present. IVC filter  is in place. No significant adenopathy is  present. Reproductive: Uterus and bilateral adnexa are unremarkable. Other: No abdominal wall hernia or abnormality. No abdominopelvic ascites. Musculoskeletal: Vertebral body heights are normal. No acute or healing fractures are present. No focal osseous lesions are present. The bony pelvis is within normal limits. The hips are located and normal. IMPRESSION: 1. No acute or focal lesion to explain the patient's sepsis. 2. Stable 5 mm nodule posteriorly in the left lower lobe. Given the stability for over 2 years, this is likely benign. Recommend no imaging follow-up. 3. IVC filter is in place. 4. No evidence for metastatic disease to the chest, abdomen, or pelvis. 5. Aortic Atherosclerosis (ICD10-I70.0) and Emphysema (ICD10-J43.9). Electronically Signed   By: Marin Roberts M.D.   On: 02/15/2023 11:57   DG Chest Port 1 View  Result Date: 02/15/2023 CLINICAL DATA:  67 year old female with history of shortness of breath. Seizures. EXAM: PORTABLE CHEST 1 VIEW COMPARISON:  Chest x-ray 02/13/2023. FINDINGS: There is a right upper extremity PICC with tip terminating in the distal superior vena cava. Lung volumes are normal. No consolidative airspace disease. No pleural effusions. No pneumothorax. No pulmonary nodule or mass noted. Pulmonary vasculature and the cardiomediastinal silhouette are within normal limits. Atherosclerosis in the thoracic aorta. Numerous surgical clips project over the left breast and left axilla, likely from prior lumpectomy and lymph node dissection. IMPRESSION: 1. Support apparatus, as above. 2. No radiographic evidence of acute cardiopulmonary disease. 3. Aortic atherosclerosis. Electronically Signed   By: Trudie Reed M.D.   On: 02/15/2023 08:15   DG Chest Port 1 View  Result Date: 02/13/2023 CLINICAL DATA:  Fever EXAM: PORTABLE CHEST 1 VIEW COMPARISON:  02/09/2023 FINDINGS: PICC on the right with tip at the upper cavoatrial junction. Bilateral interstitial coarsening from  likely from airway thickening. There is no edema, consolidation, effusion, or pneumothorax. Normal heart size and mediastinal contours. Remote right mid clavicle fracture with healing. IMPRESSION: Mild bronchitic markings.  No focal pneumonia. Electronically Signed   By: Tiburcio Pea M.D.   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 She presents with breakthrough seizures in the setting of medication noncompliance due to changes in insurance causing delay in receiving her Vimpat.  EEG to evaluate for seizure  Level of alertness: Awake, asleep  AEDs during EEG study: LCM, PHT, GBP, Onfi  Technical aspects: This EEG study was done with scalp electrodes positioned according to the 10-20 International system of electrode placement. Electrical activity was reviewed with band pass filter of 1-70Hz , sensitivity of 7 uV/mm, display speed of 76mm/sec with a 60Hz  notched filter applied as appropriate. EEG data were recorded continuously and digitally stored.  Video monitoring was available and reviewed as appropriate.  Description: The posterior dominant rhythm consists of 8-9 Hz activity of moderate voltage (25-35 uV) seen predominantly in posterior head regions, symmetric and reactive to eye opening and eye closing. Sleep was characterized by sleep spindles (12-14hz ), maximal fronto-central region. EEG showed continuous polymorphic sharply contoured rhythmic 3 to 6 Hz theta-delta slowing in right hemisphere, maximal right posterior quadrant. Abundant sharp waves were noted in right  posterior quadrant. Hyperventilation  and photic stimulation were not performed.   ABNORMALITY - Sharp waves, right posterior quadrant - Continuous rhythmic slow, right hemisphere, maximal right posterior quadrant  IMPRESSION: This study showed evidence of epileptogenicity and cortical dysfunction arising from right hemisphere, maximal right posterior quadrant. No seizures were noted.  Charlsie Quest  [place] EKG SITE RITE  Result Date: 02/10/2023 If Site Rite image not attached, placement could not be confirmed due to current cardiac rhythm.  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 She presents with breakthrough seizures in the setting of medication noncompliance due to changes in insurance causing delay in receiving her Vimpat.  EEG to evaluate for seizure  Level of alertness: Awake, asleep  AEDs during EEG study: LCM, PHT, GBP, Ativan  Technical aspects: This EEG study was done with scalp electrodes positioned according to the 10-20 International system of electrode placement. Electrical activity was reviewed with band pass filter of 1-70Hz , sensitivity of 7 uV/mm, display speed of 73mm/sec with a 60Hz  notched filter applied as appropriate. EEG data were recorded continuously and digitally stored.  Video monitoring was available and reviewed as appropriate.  Description: The posterior dominant rhythm consists of 8-9 Hz activity of moderate voltage (25-35 uV) seen predominantly in posterior head regions, symmetric and reactive to eye opening and eye closing. Sleep was characterized by sleep spindles (12-14hz ), maximal fronto-central region. EEG showed continuous  polymorphic sharply contoured rhythmic 3 to 6 Hz theta-delta slowing in right hemisphere, maximal right posterior quadrant with waxing and waning morphology. Lateralized periodic discharges with overriding fast activity were noted in right hemisphere, maximal right posterior quadrant, at 1Hz . Independent left anterior temporal spikes were also noted. Hyperventilation and photic stimulation were not performed.   56 seizures were noted. No clinical signs were seen. EEG showed 3-5Hz  theta-delta slowing in right posterior quadrant which the involves into right frontotemporal region admixed with sharp waves at 3-4hz . EEG then showed high amplitude sharply contoured 12-13Hz  beta activity and involved left hemisphere. Subsequently EEG showed 2-3Hz  delta slowing. Average duration of seizures was 45 seconds to 1 minute. Last seizure was noted on 02/10/2023 at 1313.  ABNORMALITY - Seizure without clinical signs, right posterior quadrant - Lateralized periodic discharges with overriding fast activity, right hemisphere, maximal right posterior quadrant ( LPD+F) - Spike, left anterior temporal - Continuous rhythmic slow, right hemisphere, maximal right posterior quadrant  IMPRESSION: This study showed 56 seizures without clinical signs arising from  right posterior quadrant, lasting about 45 seconds to 1 minute. Last seizure was noted on 02/10/2023 at 1313.  Additionally there was evidence of epileptogenicity and cortical dysfunction arising from right hemisphere, maximal right posterior quadrant. There was also independent epileptogenicity arising from left anterior temporal region. Dr. Otelia Limes was notified.  Charlsie Quest   EEG adult  Result Date: 02/09/2023 Charlsie Quest, MD     02/09/2023 10:04 PM Patient Name: [name] MRN: [id number] Epilepsy Attending: Charlsie Quest Referring Physician/Provider: Caryl Pina, MD Date: 02/09/2023 Duration: 22.25 mins Patient history:  67 year old female with a history of right  parieto-occipital AVM s/p rupture in 1989 s/p craniectomy and subsequent epilepsy since  that time. She presents with breakthrough seizures in the setting of medication noncompliance due to changes in insurance causing delay in receiving her Vimpat.  EEG to evaluate for seizure Level of alertness: Awake AEDs during EEG study: LCM, PHT, Ativan Technical aspects: This EEG study was done with scalp electrodes positioned according to the 10-20 International system of electrode placement. Electrical activity was reviewed with band pass filter of 1-70Hz , sensitivity of 7 uV/mm, display speed of 84mm/sec with a 60Hz  notched filter applied as appropriate. EEG data were recorded continuously and digitally stored.  Video monitoring was available and reviewed as appropriate. Description: The posterior dominant rhythm consists of 8-9 Hz activity of moderate voltage (25-35 uV) seen predominantly in posterior head regions, symmetric and reactive to eye opening and eye closing. EEG showed continuous polymorphic sharply contoured rhythmic 3 to 6 Hz theta-delta slowing in right hemisphere, maximal right posterior quadrant with waxing and waning morphology. Abundant spike and waves were noted in right hemisphere, maximal right posterior quadrant, at times qasi- periodic at 1-2Hz . Frequently, the right hemispheric spikes also involved the left  frontal region. Hyperventilation and photic stimulation were not performed.   ABNORMALITY - Spike and waves, right hemisphere, maximal right posterior quadrant - Continuous rhythmic slow, right hemisphere, maximal right posterior quadrant IMPRESSION: This study showed evidence of epileptogenicity and cortical dysfunction arising from right hemisphere, maximal right posterior quadrant. This EEG pattern is on the ictal-interictal continuum with potential for seizures. No definite seizures were seen throughout the recording. Dr. Otelia Limes was notified. Charlsie Quest   DG Chest Portable 1  View  Result Date: 02/09/2023 CLINICAL DATA:  Cough. EXAM: PORTABLE CHEST 1 VIEW COMPARISON:  October 07, 2022. FINDINGS: The heart size and mediastinal contours are within normal limits. Both lungs are clear. The visualized skeletal structures are unremarkable. IMPRESSION: No active disease. Electronically Signed   By: Lupita Raider M.D.   On: 02/09/2023 13:37   CT HEAD WO CONTRAST ( )  Result Date: 02/09/2023 CLINICAL DATA:  Provided history: Seizure disorder, clinical change. EXAM: CT HEAD WITHOUT CONTRAST TECHNIQUE: Contiguous axial images were obtained from the base of the skull through the vertex without intravenous contrast. RADIATION DOSE REDUCTION: This exam was performed according to the departmental dose-optimization program which includes automated exposure control, adjustment of the mA and/or kV according to patient size and/or use of iterative reconstruction technique. COMPARISON:  Prior head CT examinations 11/04/2022 and earlier. FINDINGS: Brain: Redemonstrated large focus of chronic encephalomalacia/gliosis within the posterior right cerebral hemisphere (underlying a cranioplasty). There is no acute intracranial hemorrhage. No acute demarcated cortical infarct. No extra-axial fluid collection. No evidence of an intracranial mass. No midline shift. Vascular: No hyperdense vessel.  Atherosclerotic calcifications. Skull: Right parietooccipital cranioplasty. No acute calvarial fracture or aggressive osseous lesion. Sinuses/Orbits: No orbital mass or acute orbital finding. Small to moderate-sized fluid level, and mild background mucosal thickening, within the right maxillary sinus. Other: Small-volume fluid within the left mastoid air cells. IMPRESSION: 1.  No evidence of an acute intracranial abnormality. 2. 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 Correlate with the surgical history. 3. Right  maxillary sinusitis. 4. Small-volume fluid within the left mastoid air cells. Electronically Signed   By: Jackey Loge D.O.   On: 02/09/2023 12:55    Micro Results    Recent Results (from the past 240 hour(s))  Culture, group A strep     Status: None   Collection Time: 02/13/23 11:35  AM   Specimen: Throat  Result Value Ref Range Status   Specimen Description THROAT  Final   Special Requests NONE  Final   Culture   Final    NO GROUP A STREP (S.PYOGENES) ISOLATED Performed at [facility], [address] [place], [place] [address]    Report Status 02/16/2023 FINAL  Final  Group A Strep by PCR     Status: None   Collection Time: 02/13/23  3:54 PM   Specimen: Throat; Sterile Swab  Result Value Ref Range Status   Group A Strep by PCR NOT DETECTED NOT DETECTED Final    Comment: Performed at [facility], [address] [place], [place] [address]  Culture, blood (Routine X 2) w Reflex to ID Panel     Status: None (Preliminary result)   Collection Time: 02/15/23  8:44 AM   Specimen: BLOOD LEFT HAND  Result Value Ref Range Status   Specimen Description BLOOD LEFT HAND  Final   Special Requests   Final    BOTTLES DRAWN AEROBIC AND ANAEROBIC Blood Culture adequate volume   Culture   Final    NO GROWTH 4 DAYS Performed at [facility], [address], [place], [place] [address]    Report Status PENDING  Incomplete  Culture, blood (Routine X 2) w Reflex to ID Panel     Status: None (Preliminary result)   Collection Time: 02/15/23  8:49 AM   Specimen: BLOOD LEFT HAND  Result Value Ref Range Status   Specimen Description BLOOD LEFT HAND  Final   Special Requests   Final    BOTTLES DRAWN AEROBIC ONLY Blood Culture adequate volume   Culture   Final    NO GROWTH 4 DAYS Performed at [facility], [address], [place], [place] [address]    Report Status PENDING  Incomplete  Respiratory (~20 pathogens) panel by PCR     Status: None   Collection Time: 02/16/23   6:01 AM   Specimen: Nasopharyngeal Swab; Respiratory  Result Value Ref Range Status   Adenovirus NOT DETECTED NOT DETECTED Final   Coronavirus 229E NOT DETECTED NOT DETECTED Final    Comment: (NOTE) The Coronavirus on the Respiratory Panel, DOES NOT test for the novel  Coronavirus (2019 nCoV)    Coronavirus HKU1 NOT DETECTED NOT DETECTED Final   Coronavirus NL63 NOT DETECTED NOT DETECTED Final   Coronavirus OC43 NOT DETECTED NOT DETECTED Final   Metapneumovirus NOT DETECTED NOT DETECTED Final   Rhinovirus / Enterovirus NOT DETECTED NOT DETECTED Final   Influenza A NOT DETECTED NOT DETECTED Final   Influenza B NOT DETECTED NOT DETECTED Final   Parainfluenza Virus 1 NOT DETECTED NOT DETECTED Final   Parainfluenza Virus 2 NOT DETECTED NOT DETECTED Final   Parainfluenza Virus 3 NOT DETECTED NOT DETECTED Final   Parainfluenza Virus 4 NOT DETECTED NOT DETECTED Final   Respiratory Syncytial Virus NOT DETECTED NOT DETECTED Final   Bordetella pertussis NOT DETECTED NOT DETECTED Final   Bordetella Parapertussis NOT DETECTED NOT DETECTED Final   Chlamydophila pneumoniae NOT DETECTED NOT DETECTED Final   Mycoplasma pneumoniae NOT DETECTED NOT DETECTED Final    Comment: Performed at [facility], [address] [place], [place] [address]    Today   Subjective    [name] today has no headache,no chest abdominal pain,no new weakness tingling or numbness, has some low back pain and flank pain.   Objective  Blood pressure 111/76, pulse 79, temperature 98.5 F (36.9 C), temperature source Oral, resp. rate 19, weight 56.4 kg, SpO2 95%.   Intake/Output Summary (Last 24 hours) at 02/19/2023 1427 Last data filed at 02/19/2023 1200 Gross per 24 hour  Intake 861.84 ml  Output 300 ml  Net 561.84 ml    Exam  Awake Alert, No new F.N deficits,    .AT,PERRAL Supple Neck,   Symmetrical Chest wall movement, Good air movement bilaterally, CTAB RRR,No Gallops,   +ve B.Sounds, Abd  Soft, Non tender,  No Cyanosis, Clubbing or edema    Data Review   Recent Labs  Lab 02/14/23 0155 02/15/23 0844 02/16/23 0410 02/17/23 0358 02/18/23 0539 02/19/23 0612  WBC 6.8 6.6 4.2 4.0 4.6 4.5  HGB 11.0* 11.1* 9.3* 9.7* 10.0* 10.0*  HCT 33.2* 33.2* 29.1* 30.3* 30.6* 30.5*  PLT 93* 109* 108* 124* 179 201  MCV 104.1* 99.7 102.5* 104.5* 104.1* 102.0*  MCH 34.5* 33.3 32.7 33.4 34.0 33.4  MCHC 33.1 33.4 32.0 32.0 32.7 32.8  RDW 12.4 12.1 12.4 12.3 12.4 12.4  LYMPHSABS 1.2  --  1.1 1.1 1.0  --   MONOABS 0.6  --  0.4 0.4 0.3  --   EOSABS 0.1  --  0.0 0.1 0.1  --   BASOSABS 0.0  --  0.0 0.0 0.0  --     Recent Labs  Lab 02/15/23 0844 02/16/23 0410 02/17/23 0358 02/18/23 0539 02/19/23 0612  NA 134* 135 140 137 137  K 3.3* 3.8 3.2* 3.5 3.6  CL 96* 104 105 107 106  CO2 22 27 27 22 22   ANIONGAP 16* 4* 8 8 9   GLUCOSE 125* 100* 103* 97 100*  BUN 10 10 10  6* 8  CREATININE 0.76 0.73 0.62 0.63 0.68  AST 18 17 17 18 15   ALT 15 17 19 18 18   ALKPHOS 48 43 54 56 55  BILITOT 0.4 0.4 0.2* 0.4 0.2*  ALBUMIN 2.6* 2.4* 2.3* 2.4* 2.5*  CRP 20.9* 19.2* 20.0* 14.5* 12.9*  PROCALCITON 0.15 0.10 <0.10 <0.10 <0.10  TSH 1.576  --   --   --   --   BNP  --  94.1 220.1* 132.8* 116.0*  MG 1.7 2.0 2.1 2.0 2.1  CALCIUM 8.6* 8.2* 8.4* 8.4* 8.5*    Total Time in preparing paper work, data evaluation and todays exam - 35 minutes  Signature  -    Susa Raring M.D on 02/19/2023 at 2:27 PM   -  To page go to [email or web address]

## 2023-02-19 NOTE — Progress Notes (Addendum)
Inpatient Rehabilitation Admissions Coordinator   I met with patient at bedside. She wishes to pursue admit to CIR at this time. We reviewed estimated cost of care and she is in agreement. She does not wish to be home by her Boyfriend's or her birthday this week. She feels she needs to be more independent prior to return home as she was before admission. I contacted pt's daughter by phone and she is aware and in agreement. Patient has requested that her "confidential" status be removed upon admitting to CIR. I will make our team aware. I Notified Dr Thedore Mins, acute team and TOC. I will make the arrangements to admit today.  Ottie Glazier, RN, MSN Rehab Admissions Coordinator [phone] 02/19/2023 10:45 AM  Notified by Dr Thedore Mins of need for CT abd and pelvis. I await results before proceeding admission.  Ottie Glazier, RN, MSN Rehab Admissions Coordinator [phone] 02/19/2023 11:42 AM

## 2023-02-19 NOTE — Progress Notes (Signed)
ANTICOAGULATION CONSULT NOTE-Follow Up  Pharmacy Consult for Heparin transitioned to Eliquis Indication: DVT  Allergies  Allergen Reactions   Zonegran [Zonisamide] Other (See Comments)    Numbness and tingling over whole body   Chocolate Other (See Comments)    Triggers seizures   Codeine Nausea And Vomiting   Keppra [Levetiracetam] Other (See Comments)    Causes seizures   Lyrica [Pregabalin] Nausea And Vomiting   Olive Oil Rash    Patient Measurements: Weight: 56.4 kg (124 lb 5.4 oz) Heparin Dosing Weight: TBW  Vital Signs: Temp: 98.5 F (36.9 C) (07/18 1247) Temp Source: Oral (07/18 1247) BP: 111/76 (07/18 1247) Pulse Rate: 79 (07/18 1247)  Labs: Recent Labs    02/17/23 0358 02/18/23 0539 02/18/23 2046 02/19/23 0612  HGB 9.7* 10.0*  --  10.0*  HCT 30.3* 30.6*  --  30.5*  PLT 124* 179  --  201  HEPARINUNFRC  --  0.15* 0.21* 0.23*  CREATININE 0.62 0.63  --  0.68    Estimated Creatinine Clearance: 63.3 mL/min (by C-G formula based on SCr of 0.68 mg/dL).   Medical History: Past Medical History:  Diagnosis Date   Acute bronchitis 08/06/2022   Acute respiratory failure with hypoxia (HCC) 03/31/2015   Altered mental status    Asthma    Ataxia 08/26/2019   Atypical chest pain 02/17/2017   Cerebral hemorrhage (HCC) 1989   Chronic back pain    Closed fracture of distal end of left radius 07/17/2022   Closed fracture of fifth metatarsal bone 01/04/2019   Closed fracture of proximal phalanx of great toe 01/04/2019   Closed nondisplaced fracture of styloid process of left radius 08/06/2022   COPD (chronic obstructive pulmonary disease) (HCC)    no meds per daughter   COPD with exacerbation (HCC) 03/30/2015   Costochondritis 02/17/2017   COVID-19 virus infection 04/02/2020   DDD (degenerative disc disease) 06/12/2013   DVT (deep venous thrombosis) (HCC)    "she's had several since 1990"   GERD (gastroesophageal reflux disease)    no meds per daughter    Headache    "usually around time when she's had a seizure"   History of blood transfusion    "when she was a baby"   History of kidney stones    History of stomach ulcers    Hypoxia 01/18/2015   Lactic acidosis 04/02/2020   Neuropathy    Seizures (HCC)    "because of her brain surgery"  last one 07/28/22   Sepsis secondary to UTI (HCC) 01/17/2015   Stroke [facility]) 1990's   family denies residual on 11/09/2014   Tunnel vision    "since brain OR"     Assessment: 67 year old female with a 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 US findings consistent with acute deep vein thrombosis involving the right axillary vein. Previously therapeutic on heparin 950 units/hr.   7/18 AM: heparin level subtherapeutic at 0.23 on 1250 units/hr. No issues with heparin gtt documented or signs/symptoms of bleeding. CBC stable  UPDATE  7/18: AM;   pharmacy consulted to transition to Elqiuis.  Stop heparin > start Eliquis 10mg  bid x7 days then on 7/25 reduce to 5mg  BID. Copay for warf, eliquis or xarelto is $0.00 zero  Goal of Therapy:  Heparin level 0.3-0.5 units/ml Monitor platelets by anticoagulation protocol: Yes   Plan:  Discontinued Heparin gtt this AM and started  Eliquis 10mg  bid x7 days then on 7/25 reduce to 5mg  BID Copay for Eliquis is $0.00 zero  Noah Delaine, RPh Clinical Pharmacist 02/19/2023 3:27 PM

## 2023-02-19 NOTE — H&P (Signed)
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 Unfractionated 0.23 (L) 0.30 - 0.70 IU/mL    Comment: (NOTE) The clinical reportable range upper limit is being lowered to >1.10 to align with the FDA approved guidance for the current laboratory assay.  If heparin results are below expected values, and patient dosage has  been confirmed, suggest follow up testing of antithrombin III levels. Performed at [facility], [address], [place], [place] [address]   Glucose, capillary     Status: Abnormal   Collection Time: 02/19/23  8:50 AM  Result Value Ref Range   Glucose-Capillary 163  (H) 70 - 99 mg/dL    Comment: Glucose reference range applies only to samples taken after fasting for at least 8 hours.   CT ANGIO HEAD NECK W WO CM  Result Date: 02/18/2023 CLINICAL DATA:  DVT with need for anticoagulation. History of ruptured aneurysm in 1989. EXAM: CT ANGIOGRAPHY HEAD AND NECK WITH AND WITHOUT CONTRAST TECHNIQUE: Multidetector CT imaging of the head and neck was performed using the standard protocol during bolus administration of intravenous contrast. Multiplanar CT image reconstructions and MIPs were obtained to evaluate the vascular anatomy. Carotid stenosis measurements (when applicable) are obtained utilizing NASCET criteria, using the distal internal carotid diameter as the denominator. RADIATION DOSE REDUCTION: This exam was performed according to the departmental dose-optimization program which includes automated exposure control, adjustment of the mA and/or kV according to patient size and/or use of iterative reconstruction technique. CONTRAST:  75mL OMNIPAQUE IOHEXOL 350 MG/ML SOLN COMPARISON:  Brain MRI from 3 days ago FINDINGS: CT HEAD FINDINGS Brain: Dense encephalomalacia in the parasagittal right occipital and parietal lobes beneath the craniotomy flap. No evidence of acute infarct, hemorrhage, hydrocephalus, mass, or collection. Vascular: See below Skull: Right posterior craniotomy which is mildly sunken Sinuses/Orbits: No acute finding. Review of the MIP images confirms the above findings CTA NECK FINDINGS Aortic arch: Limited coverage showing atherosclerosis. The brachiocephalic origin is not seen. Right carotid system: Significant motion artifact. Mild atheromatous plaque without stenosis or ulceration. Left carotid system: Significant motion artifact. Mild to moderate plaque at the bifurcation which is mixed density. No stenosis or ulceration. Vertebral arteries: Proximal subclavian atherosclerosis without significant stenosis or ulceration. The left vertebral artery is  dominant. Vertebral arteries are smoothly contoured and widely patent. Skeleton: No acute or aggressive finding Other neck: Motion artifact.  No inflammation or mass. Upper chest: Centrilobular emphysema at the apices. Review of the MIP images confirms the above findings CTA HEAD FINDINGS Anterior circulation: Atheromatous plaque at the carotid siphons. No branch occlusion, beading,  or aneurysm. Posterior circulation: The vertebral and basilar arteries are smoothly contoured and widely patent. Diminished flow in peripheral right PCA branches in the setting of dense remote infarct. No aneurysm or signs of vascular malformation. Venous sinuses: Diffusely patent Anatomic variants: None significant Review of the MIP images confirms the above findings IMPRESSION: No emergent finding. Negative for aneurysm or vascular malformation. Electronically Signed   By: Tiburcio Pea M.D.   On: 02/18/2023 05:07   VAS [place] UPPER EXTREMITY VENOUS DUPLEX  Result Date: 02/17/2023 UPPER VENOUS STUDY  Patient Name:  MELVIE PAGLIA  Date of Exam:   02/17/2023 Medical Rec #: [id number]       Accession #:    [id number] Date of Birth: [birth year]       Patient Gender: F Patient Age:   82 years Exam Location:  [facility] Procedure:      VAS [place] UPPER EXTREMITY VENOUS DUPLEX Referring Phys: Bess Harvest [facility] --------------------------------------------------------------------------------  Indications: Fever Other Indications: Recent RUE PICC line placement. Risk Factors: DVT HX of multiple DVTs. Limitations: PICC line placement/bandage. Comparison Study: Previous exam on 08/09/20 was postive for DVT/SVT in BIL                   brachial veins and RUE basilic vein. Performing Technologist: Ernestene Mention RVT, RDMS  Examination Guidelines: A complete evaluation includes B-mode imaging, spectral Doppler, color Doppler, and power Doppler as needed of all accessible portions of each vessel. Bilateral testing is considered an integral part of a  complete examination. Limited examinations for reoccurring indications may be performed as noted.  Right Findings: +----------+------------+---------+-----------+----------+--------------------+ RIGHT     CompressiblePhasicitySpontaneousProperties      Summary        +----------+------------+---------+-----------+----------+--------------------+ IJV           Full       Yes       Yes                                   +----------+------------+---------+-----------+----------+--------------------+ Subclavian    Full       Yes       Yes                                   +----------+------------+---------+-----------+----------+--------------------+ Axillary    Partial      Yes       Yes                     Acute         +----------+------------+---------+-----------+----------+--------------------+ Brachial      Full       Yes       Yes              not well visualized  +----------+------------+---------+-----------+----------+--------------------+ Radial        Full                                                       +----------+------------+---------+-----------+----------+--------------------+ Ulnar         Full                                                       +----------+------------+---------+-----------+----------+--------------------+  Cephalic      Full                                                       +----------+------------+---------+-----------+----------+--------------------+ Basilic       Full                                   only visualized in                                                            forearm        +----------+------------+---------+-----------+----------+--------------------+ Brachial vein note well visualized and upper arm basilic vein not seen due to PICC line placement.  Left Findings: +----------+------------+---------+-----------+----------+-------+ LEFT       CompressiblePhasicitySpontaneousPropertiesSummary +----------+------------+---------+-----------+----------+-------+ Subclavian               Yes       Yes                      +----------+------------+---------+-----------+----------+-------+  Summary:  Right: No evidence of superficial vein thrombosis in the upper extremity. Findings consistent with acute deep vein thrombosis involving the right axillary vein.  Left: No evidence of thrombosis in the subclavian.  *See table(s) above for measurements and observations.  Diagnosing physician: Waverly Ferrari MD Electronically signed by Waverly Ferrari MD on 02/17/2023 at 5:47:27 PM.    Final       Blood pressure (!) 102/53, pulse 64, temperature 98.6 F (37 C), temperature source Axillary, resp. rate 17, weight 56.4 kg, SpO2 93%.  Medical Problem List and Plan: 1. Functional deficits secondary to seizures- ran out meds due to insurance issues with more cognitive deficits and increased weakness above baseline  -patient may  shower  -ELOS/Goals: 2-2.5 weeks- min A to supervision  Admit to CIR  2.  Antithrombotics: -DVT/anticoagulation:  Pharmaceutical: Cannot have Eliquis or Xarelto due to interaction with Phenytoin- will change to tx dose lovenox and transition to Coumadin.   -antiplatelet therapy: none  3. Pain Management: Tylenol as needed (no tramadol)  -Lidoderm as needed  4. Mood/Behavior/Sleep: LCSW to evaluate and provide emotional support (no trazodone)  -antipsychotic agents: n/a  5. Neuropsych/cognition: This patient is not quite capable of making decisions on her own behalf.  6. Skin/Wound Care: Routine skin care checks   7. 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 CVA tenderness/flank pain-= CT done- was negative for any issues 17. Constipation- will order Sorbitol 30cc     Milinda Antis, PA-C 02/19/2023  I have personally performed a face to face diagnostic evaluation of this patient and formulated the key components of the plan.  Additionally, I have personally reviewed laboratory data, imaging studies, as well as relevant notes and concur with the physician assistant's documentation above.   The patient's status has not changed from the original H&P.  Any changes in documentation from the acute care chart have been noted above.

## 2023-02-19 NOTE — Progress Notes (Signed)
Physical Therapy Treatment Patient Details Name: [name] MRN: [id number] DOB: June 12, 1956 Today's Date: 02/19/2023   History of Present Illness 67 y.o. female presents to [facility] hospital on 02/09/2023 after multiple seizures at home. Pt with another seizure on morning of 7/9. PMH: cerebral hemorrhage 1989, asthma, COPD, DVT, GERD, seizures, CVA, tunnel vision, neuropathy    PT Comments  Pt treatment was short today due to pt reports pain in bil posterior flank. RN aware and cleared pt to participate in skilled physical therapy services. Pt pain increases with any mobility and improved with rest. Discussed deferring movement any further at this time with pt. Pt currently is supervision to Min a for bed mobility and sit to stand at EOB. Pt will continue to benefit from current POC. Due to pt current functional status, home set up and available assistance at home recommending intensive frequency of skilled physical therapy services at 5-6x/week on discharge in order to return to PLOF and age related activities. No signs/symptoms of cardiac/respiratory distress throughout session.      Assistance Recommended at Discharge Frequent or constant Supervision/Assistance  If plan is discharge home, recommend the following:  Can travel by private vehicle    Assistance with cooking/housework;Assist for transportation;Help with stairs or ramp for entrance;A little help with walking and/or transfers      Equipment Recommendations  Other (comment) (defer to post acute)    Recommendations for Other Services       Precautions / Restrictions Precautions Precautions: Fall Restrictions Weight Bearing Restrictions: No     Mobility  Bed Mobility Overal bed mobility: Needs Assistance Bed Mobility: Supine to Sit, Sit to Supine     Supine to sit: Supervision, HOB elevated Sit to supine: Supervision     Patient Response: Cooperative  Transfers Overall transfer level: Needs assistance Equipment  used: Rolling walker (2 wheels) Transfers: Sit to/from Stand Sit to Stand: Min assist           General transfer comment: sit to stand from EOB with slight increase in pain in the flank bil with any movement that improved with rest.    Ambulation/Gait       General Gait Details: Deferred gait this session due to new bil flank pain that increased with any movement.    Tilt Bed Tilt Bed Patient Response: Cooperative  Modified Rankin (Stroke Patients Only)       Balance Overall balance assessment: Needs assistance Sitting-balance support: No upper extremity supported, Feet supported Sitting balance-Leahy Scale: Fair Sitting balance - Comments: no overt LOB   Standing balance support: Bilateral upper extremity supported Standing balance-Leahy Scale: Poor Standing balance comment: Reliant on therapist and AD for balance during functional mobility.        Cognition Arousal/Alertness: Awake/alert Behavior During Therapy: Flat affect, WFL for tasks assessed/performed Overall Cognitive Status: Within Functional Limits for tasks assessed             General Comments General comments (skin integrity, edema, etc.): Pt has new onset pain in bil flank. RN is aware. Pain increased with mobility      Pertinent Vitals/Pain Pain Assessment Pain Assessment: 0-10 Pain Score: 6  Pain Location: bil flank pain Pain Descriptors / Indicators: Aching, Grimacing, Sore Pain Intervention(s): Limited activity within patient's tolerance, Other (comment) (RN notified that movement slightly increased pain but improved upon lying back down.)     PT Goals (current goals can now be found in the care plan section) Acute Rehab PT Goals Patient Stated Goal: to  return to prior level of independence PT Goal Formulation: With patient Time For Goal Achievement: 02/25/23 Potential to Achieve Goals: Good Progress towards PT goals: Progressing toward goals    Frequency    Min  4X/week      PT Plan Current plan remains appropriate       AM-PAC PT "6 Clicks" Mobility   Outcome Measure  Help needed turning from your back to your side while in a flat bed without using bedrails?: A Little Help needed moving from lying on your back to sitting on the side of a flat bed without using bedrails?: A Little Help needed moving to and from a bed to a chair (including a wheelchair)?: A Little Help needed standing up from a chair using your arms (e.g., wheelchair or bedside chair)?: A Little Help needed to walk in hospital room?: A Little Help needed climbing 3-5 steps with a railing? : A Lot 6 Click Score: 17    End of Session Equipment Utilized During Treatment: Gait belt Activity Tolerance: Patient limited by pain Patient left: in bed;with call bell/phone within reach;with bed alarm set Nurse Communication: Mobility status PT Visit Diagnosis: Other abnormalities of gait and mobility (R26.89);Muscle weakness (generalized) (M62.81);Difficulty in walking, not elsewhere classified (R26.2)     Time: 7564-3329 PT Time Calculation (min) (ACUTE ONLY): 13 min  Charges:    $Therapeutic Activity: 8-22 mins PT General Charges $$ ACUTE PT VISIT: 1 Visit                     Harrel Carina, DPT, CLT  Acute Rehabilitation Services Office: [phone] (Secure chat preferred)    Claudia Desanctis 02/19/2023, 1:43 PM

## 2023-02-19 NOTE — Discharge Instructions (Signed)
Inpatient Rehab Discharge Instructions  [name] Discharge date and time:  02/28/2023  Activities/Precautions/ Functional Status: Activity: no lifting, driving, or strenuous exercise until cleared by MD Diet: cardiac diet Wound Care: none needed Functional status:  ___ No restrictions     ___ Walk up steps independently ___ 24/7 supervision/assistance   ___ Walk up steps with assistance __x_ Intermittent supervision/assistance  ___ Bathe/dress independently ___ Walk with walker     ___ Bathe/dress with assistance ___ Walk Independently    ___ Shower independently ___ Walk with assistance    _x__ Shower with assistance __x_ No alcohol     ___ Return to work/school ________  Special Instructions:  No driving, alcohol consumption or tobacco use.   COMMUNITY REFERRALS UPON DISCHARGE:    Home Health:   PT     OT                    Agency: [facility]     Phone: [phone] *Please expect follow-up within 2-3 days to schedule your home visit. If you have not received follow-up, be sure to contact the branch directly.*       My questions have been answered and I understand these instructions. I will adhere to these goals and the provided educational materials after my discharge from the hospital.  Patient/Caregiver Signature _______________________________ Date __________  Clinician Signature _______________________________________ Date __________  Please bring this form and your medication list with you to all your follow-up doctor's appointments.     Information on my medicine - Coumadin   (Warfarin)  This medication education was reviewed with me or my healthcare representative as part of my discharge preparation.   Why was Coumadin prescribed for you? Coumadin was prescribed for you because you have a blood clot or a medical condition that can cause an increased risk of forming blood clots. Blood clots can cause serious health problems by blocking the flow  of blood to the heart, lung, or brain. Coumadin can prevent harmful blood clots from forming. As a reminder your indication for Coumadin is:  blood clot in right upper extremity  What test will check on my response to Coumadin? While on Coumadin (warfarin) you will need to have an INR test regularly to ensure that your dose is keeping you in the desired range. The INR (international normalized ratio) number is calculated from the result of the laboratory test called prothrombin time (PT).  If an INR APPOINTMENT HAS NOT ALREADY BEEN MADE FOR YOU please schedule an appointment to have this lab work done by your health care provider within 7 days. Your INR goal is usually a number between:  2 to 3 or your provider may give you a more narrow range like 2-2.5.  Ask your health care provider during an office visit what your goal INR is.  What  do you need to  know  About  COUMADIN? Take Coumadin (warfarin) exactly as prescribed by your healthcare provider about the same time each day.  DO NOT stop taking without talking to the doctor who prescribed the medication.  Stopping without other blood clot prevention medication to take the place of Coumadin may increase your risk of developing a new clot or stroke.  Get refills before you run out.  What do you do if you miss a dose? If you miss a dose, take it as soon as you remember on the same day then continue your regularly scheduled regimen the next day.  Do  not take two doses of Coumadin at the same time.  Important Safety Information A possible side effect of Coumadin (Warfarin) is an increased risk of bleeding. You should call your healthcare provider right away if you experience any of the following: Bleeding from an injury or your nose that does not stop. Unusual colored urine (red or dark brown) or unusual colored stools (red or black). Unusual bruising for unknown reasons. A serious fall or if you hit your head (even if there is no  bleeding).  Some foods or medicines interact with Coumadin (warfarin) and might alter your response to warfarin. To help avoid this: Eat a balanced diet, maintaining a consistent amount of Vitamin K. Notify your provider about major diet changes you plan to make. Avoid alcohol or limit your intake to 1 drink for women and 2 drinks for men per day. (1 drink is 5 oz. wine, 12 oz. beer, or 1.5 oz. liquor.)  Make sure that ANY health care provider who prescribes medication for you knows that you are taking Coumadin (warfarin).  Also make sure the healthcare provider who is monitoring your Coumadin knows when you have started a new medication including herbals and non-prescription products.  Coumadin (Warfarin)  Major Drug Interactions  Increased Warfarin Effect Decreased Warfarin Effect  Alcohol (large quantities) Antibiotics (esp. Septra/Bactrim, Flagyl, Cipro) Amiodarone (Cordarone) Aspirin (ASA) Cimetidine (Tagamet) Megestrol (Megace) NSAIDs (ibuprofen, naproxen, etc.) Piroxicam (Feldene) Propafenone (Rythmol SR) Propranolol (Inderal) Isoniazid (INH) Posaconazole (Noxafil) Barbiturates (Phenobarbital) Carbamazepine (Tegretol) Chlordiazepoxide (Librium) Cholestyramine (Questran) Griseofulvin Oral Contraceptives Rifampin Sucralfate (Carafate) Vitamin K   Coumadin (Warfarin) Major Herbal Interactions  Increased Warfarin Effect Decreased Warfarin Effect  Garlic Ginseng Ginkgo biloba Coenzyme Q10 Green tea St. John's wort    Coumadin (Warfarin) FOOD Interactions  Eat a consistent number of servings per week of foods HIGH in Vitamin K (1 serving =  cup)  Collards (cooked, or boiled & drained) Kale (cooked, or boiled & drained) Mustard greens (cooked, or boiled & drained) Parsley *serving size only =  cup Spinach (cooked, or boiled & drained) Swiss chard (cooked, or boiled & drained) Turnip greens (cooked, or boiled & drained)  Eat a consistent number of servings per  week of foods MEDIUM-HIGH in Vitamin K (1 serving = 1 cup)  Asparagus (cooked, or boiled & drained) Broccoli (cooked, boiled & drained, or raw & chopped) Brussel sprouts (cooked, or boiled & drained) *serving size only =  cup Lettuce, raw (green leaf, endive, romaine) Spinach, raw Turnip greens, raw & chopped   These websites have more information on Coumadin (warfarin):  http://www.king-russell.com/; https://www.hines.net/;

## 2023-02-19 NOTE — Progress Notes (Signed)
Genice Rouge, MD  Physician Physical Medicine and Rehabilitation   PMR Pre-admission    Signed   Date of Service: 02/12/2023  3:36 PM  Related encounter: ED to Hosp-Admission (Discharged) from 02/09/2023 in [place] [facility]   Signed     Expand All Collapse All  Show:Clear all [x] Written[x] Templated[x] Copied  Added by: [x] Standley Brooking, RN  [] Hover for details PMR Admission Coordinator Pre-Admission Assessment   Patient: [name] is an 67 y.o., female MRN: [id number] DOB: [birth year] Height:   Weight: 56.4 kg                                                                                                                                                  Insurance Information HMO: yes    PPO:      PCP:      IPA:      80/20:      OTHER:  PRIMARY: [employer] Medicare      Policy#: [id number]      Subscriber: pt CM Name: [name]      Phone#: [phone]     Fax#: [phone] Pre-Cert#: [id number] approved 7/18 until 7/24      Employer:  Benefits:  Phone #: [phone]     Name: 7/11 Eff. Date: 01/03/23     Deduct: none      Out of Pocket Max: $5500      Life Max: none  CIR: $295 co pay per day days 1 until 6      SNF: no copay per day days 1 until 20; $203 co pay per day days 21 until 100 Outpatient: $20 PER VISIT     Co-Pay:  Home Health: 100%      Co-Pay:  DME: 80%     Co-Pay: 20% Providers: in network  SECONDARY: none   Financial Counselor:       Phone#:    The Data processing manager" for patients in Inpatient Rehabilitation Facilities with attached "Privacy Act Statement-Health Care Records" was provided and verbally reviewed with: Family and patient   Emergency Contact Information Contact Information   None on File      Other Contacts       Name Relation Home Work Mobile    Sanks,[name] Daughter     [phone]    Caydence, Koenig     [phone]         Current Medical History  Patient Admitting Diagnosis:  Seizures   History of Present Illness: 67 year old female with history of thrombocytopenia, paroxysmal a fib, HTN, HLD, GERD, DVT, chronic pain, COPD, anxiety, hemorrhagic stroke, stress incontinence, seizure disorder, neuropathy and recent dx of breast cancer. Presented on 02/09/23 after multiple seizures at home. She has not had home Vimpat over the last few days due to trouble with in insurance. Was compliant with  her Dilantin.    Neurology consulted. Head CT non acute, EEG does not show any acute seizures but shows evidence of epileptogenicity and cortical dysfunction. She had another seizure 7/9. On home dose of Dilantin, and cannot increase dose due to history of side effects with high dose, placed on Vimpat. Patient was started on Onfi and that med was questioned for causing fevers, so discontinued. Placed on Topamax .   Developed fever with no clear source. CT chest abdomen pelvis unremarkable, MRI C-spine with contrast stable as she was having some neck pain, neck pain itself has resolved, lower extremity venous duplex without any clots, blood cultures negative, no white count or pro calcitonin, respiratory viral panel negative, strep throat negative, further workup revealed right upper extremity acute axillary vein DVT likely cause of her fevers.  Fever curve much improved after anticoagulation started. Removed PICC with RUE axillary vein DVT noted. Discussed with vascular surgeon , Dr Myra Gianotti. Recommend minimum of 4 to 6 weeks of oral anticoagulation thereafter f/u with PCP for repeat ultrasound. Risks and benefits reviewed with patient and daughter. Placed on heparin drip managed by pharmacy.    New onset atrial flutter on 7/14 with neck pain and fevers. Converted to NSR after a short course of Cardizem within a few hrs. OP cardiology follow up.      On oxybutynin for stress incontinence. Tramadol for chronic pain. Lyrica switched to Neurontin for neuropathy. Lovenox for DVT prophylaxis.     Patient's medical record from [facility] has been reviewed by the rehabilitation admission coordinator and physician.   Past Medical History      Past Medical History:  Diagnosis Date   Acute bronchitis 08/06/2022   Acute respiratory failure with hypoxia (HCC) 03/31/2015   Altered mental status     Asthma     Ataxia 08/26/2019   Atypical chest pain 02/17/2017   Cerebral hemorrhage (HCC) 1989   Chronic back pain     Closed fracture of distal end of left radius 07/17/2022   Closed fracture of fifth metatarsal bone 01/04/2019   Closed fracture of proximal phalanx of great toe 01/04/2019   Closed nondisplaced fracture of styloid process of left radius 08/06/2022   COPD (chronic obstructive pulmonary disease) (HCC)      no meds per daughter   COPD with exacerbation (HCC) 03/30/2015   Costochondritis 02/17/2017   COVID-19 virus infection 04/02/2020   DDD (degenerative disc disease) 06/12/2013   DVT (deep venous thrombosis) (HCC)      "she's had several since 1990"   GERD (gastroesophageal reflux disease)      no meds per daughter   Headache      "usually around time when she's had a seizure"   History of blood transfusion      "when she was a baby"   History of kidney stones     History of stomach ulcers     Hypoxia 01/18/2015   Lactic acidosis 04/02/2020   Neuropathy     Seizures (HCC)      "because of her brain surgery"  last one 07/28/22   Sepsis secondary to UTI (HCC) 01/17/2015   Stroke [facility]) 1990's    family denies residual on 11/09/2014   Tunnel vision      "since brain OR"        Has the patient had major surgery during 100 days prior to admission? No   Family History  family history includes Cancer in her brother and mother; Cervical cancer  in her maternal aunt; Colon cancer in her mother; Heart attack in her father and mother.   Current Medications   Current Medications    Current Facility-Administered Medications:    acetaminophen (TYLENOL)  tablet 650 mg, 650 mg, Oral, Q6H PRN, 650 mg at 02/18/23 1112 **OR** acetaminophen (TYLENOL) suppository 650 mg, 650 mg, Rectal, Q6H PRN, Synetta Fail, MD   Chlorhexidine Gluconate Cloth 2 % PADS 6 each, 6 each, Topical, Daily, Leroy Sea, MD, 6 each at 02/18/23 0910   cyanocobalamin (VITAMIN B12) tablet 2,000 mcg, 2,000 mcg, Oral, Daily, Shafer, Devon, NP, 2,000 mcg at 02/19/23 0850   diltiazem (CARDIZEM) injection 10 mg, 10 mg, Intravenous, Q6H PRN, Leroy Sea, MD, 10 mg at 02/15/23 0820   diltiazem (CARDIZEM) tablet 30 mg, 30 mg, Oral, Q12H, Leroy Sea, MD, 30 mg at 02/19/23 0850   gabapentin (NEURONTIN) capsule 100 mg, 100 mg, Oral, TID, Milon Dikes, MD, 100 mg at 02/19/23 0850   haloperidol lactate (HALDOL) injection 2 mg, 2 mg, Intramuscular, Q6H PRN, Leroy Sea, MD, 2 mg at 02/12/23 1926   heparin ADULT infusion 100 units/mL (25000 units/212mL), 1,350 Units/hr, Intravenous, Continuous, Arabella Merles, RPH, Last Rate: 13.5 mL/hr at 02/19/23 0725, 1,350 Units/hr at 02/19/23 0725   ipratropium-albuterol (DUONEB) 0.5-2.5 (3) MG/3ML nebulizer solution 3 mL, 3 mL, Nebulization, Q6H PRN, Howerter, Justin B, DO, 3 mL at 02/16/23 2058   lacosamide (VIMPAT) tablet 200 mg, 200 mg, Oral, BID, Synetta Fail, MD, 200 mg at 02/19/23 0850   LORazepam (ATIVAN) injection 1 mg, 1 mg, Intravenous, Once PRN, Leroy Sea, MD   LORazepam (ATIVAN) injection 1 mg, 1 mg, Intravenous, Q6H PRN, Leroy Sea, MD   melatonin tablet 3 mg, 3 mg, Oral, QHS PRN, Howerter, Justin B, DO, 3 mg at 02/15/23 2129   Oral care mouth rinse, 15 mL, Mouth Rinse, PRN, Leroy Sea, MD   oxybutynin (DITROPAN-XL) 24 hr tablet 5 mg, 5 mg, Oral, QHS, Synetta Fail, MD, 5 mg at 02/18/23 2241   phenytoin (DILANTIN) ER capsule 100 mg, 100 mg, Oral, QPM, Pham, Minh Q, RPH-CPP, 100 mg at 02/18/23 1522   phenytoin (DILANTIN) ER capsule 130 mg, 130 mg, Oral, Daily, Pham, Minh Q,  RPH-CPP, 130 mg at 02/19/23 0850   polyethylene glycol (MIRALAX / GLYCOLAX) packet 17 g, 17 g, Oral, Daily PRN, Synetta Fail, MD   topiramate (TOPAMAX) tablet 50 mg, 50 mg, Oral, BID, Charlsie Quest, MD, 50 mg at 02/19/23 0850   traMADol (ULTRAM) tablet 100 mg, 100 mg, Oral, Q6H PRN, Leroy Sea, MD, 100 mg at 02/18/23 1521     Patients Current Diet:  Diet Order                  Diet regular Fluid consistency: Thin  Diet effective now                       Precautions / Restrictions Precautions Precautions: Fall Precaution Comments: EEG Restrictions Weight Bearing Restrictions: No    Has the patient had 2 or more falls or a fall with injury in the past year?Yes, only with seizures   Prior Activity Level Limited Community (1-2x/wk): independent with adls and mobility per dtr; does not drive   Prior Functional Level Prior Function Prior Level of Function : Independent/Modified Independent Mobility Comments: Independent  without an AD. pt reports falls with seizures but no other falls ADLs Comments: Pt  reports Independent with ADLs. Per chart review, pt receives assistance with transportation and meal prep.   Self Care: Did the patient need help bathing, dressing, using the toilet or eating?  Independent   Indoor Mobility: Did the patient need assistance with walking from room to room (with or without device)? Independent   Stairs: Did the patient need assistance with internal or external stairs (with or without device)? Independent   Functional Cognition: Did the patient need help planning regular tasks such as shopping or remembering to take medications? Independent   Patient Information Are you of Hispanic, Latino/a,or Spanish origin?: A. No, not of Hispanic, Latino/a, or Spanish origin What is your race?: A. White Do you need or want an interpreter to communicate with a doctor or health care staff?: 0. No   Patient's Response To:  Health Literacy  and Transportation Is the patient able to respond to health literacy and transportation needs?: Yes Health Literacy - How often do you need to have someone help you when you read instructions, pamphlets, or other written material from your doctor or pharmacy?: Never In the past 12 months, has lack of transportation kept you from medical appointments or from getting medications?: No In the past 12 months, has lack of transportation kept you from meetings, work, or from getting things needed for daily living?: No   Home Assistive Devices / Equipment Home Equipment: Agricultural consultant (2 wheels) (per chart review)   Prior Device Use: Indicate devices/aids used by the patient prior to current illness, exacerbation or injury? None of the above   Current Functional Level Cognition   Overall Cognitive Status: Impaired/Different from baseline Current Attention Level: Sustained Orientation Level: Oriented X4 Following Commands: Follows one step commands consistently Safety/Judgement: Decreased awareness of safety, Decreased awareness of deficits General Comments: Pt with improved overall cognition this session as compared to last skilled OT session.    Extremity Assessment (includes Sensation/Coordination)   Upper Extremity Assessment: Generalized weakness RUE Sensation: decreased proprioception RUE Coordination: decreased fine motor LUE Sensation: decreased proprioception LUE Coordination: decreased fine motor  Lower Extremity Assessment: Defer to PT evaluation     ADLs   Overall ADL's : Needs assistance/impaired Eating/Feeding: Modified independent, Sitting Grooming: Min guard, Standing, Wash/dry hands, Oral care Upper Body Bathing: Min guard, Sitting Lower Body Bathing: Minimal assistance, Sit to/from stand, Cueing for safety Upper Body Dressing : Min guard, Sitting Lower Body Dressing: Min guard, Cueing for safety, Sit to/from stand, Cueing for sequencing Toilet Transfer: Min guard,  Minimal assistance, Cueing for safety, Ambulation, Regular Toilet, Grab bars (hand held assist +1 and Min guard to Min assist with gait belt) Toileting- Clothing Manipulation and Hygiene: Min guard, Cueing for sequencing, Cueing for safety, Sit to/from stand Functional mobility during ADLs: Min guard, Minimal assistance, Cueing for safety, Cueing for sequencing (hand held assist +1 and Min guard to Min assist with gait belt) General ADL Comments: Pt requires cues to bring attention to task and for initiation but shows fair attention to task once engaged in an activity. Pt currently frequently impusive with movement and requires cues for safety and sequencing. Pt with poor safety awarenss and poor insight into deficits throughout tasks.     Mobility   Overal bed mobility: Needs Assistance Bed Mobility: Supine to Sit, Sit to Supine Supine to sit: Supervision, HOB elevated Sit to supine: Supervision General bed mobility comments: Pt sitting in recliner at beginning and end of session.     Transfers   Overall transfer level: Needs  assistance Equipment used: 1 person hand held assist Transfers: Sit to/from Stand, Bed to chair/wheelchair/BSC Sit to Stand: Min guard Bed to/from chair/wheelchair/BSC transfer type:: Step pivot Step pivot transfers: Min guard, Min assist General transfer comment: cues for safety; hand held assist +1 and Min guard to Min assist with gait belt     Ambulation / Gait / Stairs / Wheelchair Mobility   Ambulation/Gait Ambulation/Gait assistance: Editor, commissioning (Feet): 60 Feet Assistive device: Rolling walker (2 wheels) Gait Pattern/deviations: Step-through pattern, Decreased stride length, Drifts right/left, Staggering right, Trunk flexed General Gait Details: Impulsive with movements, erratic RW control. Min assist for RW control in congested areas and with turns. Intermittent VC for awareness and walker placement, left drift this session Gait velocity:  reduced Gait velocity interpretation: <1.31 ft/sec, indicative of household ambulator     Posture / Balance Balance Overall balance assessment: Needs assistance Sitting-balance support: No upper extremity supported, Feet supported Sitting balance-Leahy Scale: Fair Standing balance support: Single extremity supported, Bilateral upper extremity supported, No upper extremity supported, During functional activity Standing balance-Leahy Scale: Poor Standing balance comment: Reliant on therapist for balance during functional mobility. Leaned against sink counter during ADLs to maintain balance.     Special needs/care consideration Seizure precautions    Previous Home Environment  Living Arrangements: Children, Other relatives  Lives With: Daughter (per chart review) Available Help at Discharge: Family, Available 24 hours/day Type of Home: House Home Layout: Two level Alternate Level Stairs-Rails: Right Alternate Level Stairs-Number of Steps: flight (Per chart review, pt has a stair lift) Home Access: Level entry (Per chart review) Entrance Stairs-Rails: None Entrance Stairs-Number of Steps: level entry Bathroom Shower/Tub: Tub/shower unit (per chart review) Bathroom Toilet: Standard (per chart review) Bathroom Accessibility: Yes (per chart review) How Accessible: Accessible via walker (per chart review) Home Care Services: No   Discharge Living Setting Plans for Discharge Living Setting: Lives with (comment) (daughter) Type of Home at Discharge: House Discharge Home Layout: Two level, Bed/bath upstairs Alternate Level Stairs-Rails: Right Alternate Level Stairs-Number of Steps: 15; has chair lift Discharge Home Access: Level entry Discharge Bathroom Shower/Tub: Tub/shower unit Discharge Bathroom Toilet: Standard Discharge Bathroom Accessibility: Yes How Accessible: Accessible via walker Does the patient have any problems obtaining your medications?: Yes (Describe)    Social/Family/Support Systems Patient Roles: Parent Contact Information: daughter, [name] Anticipated Caregiver: daughter, [name] Anticipated Industrial/product designer Information: see contacts Ability/Limitations of Caregiver: 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, structural Availability: 24/7 Discharge Plan Discussed with Primary Caregiver: Yes Is Caregiver In Agreement with Plan?: Yes Does Caregiver/Family have Issues with Lodging/Transportation while Pt is in Rehab?: No   Goals Patient/Family Goal for Rehab: supervision PT, supervision to min OT, supervision SLP Expected length of stay: ELOS 2 to 2 1/2 weeks Additional Information: Cognition is not at baseline; Independent doing her own finances pta Pt/Family Agrees to Admission and willing to participate: Yes Program Orientation Provided & Reviewed with Pt/Caregiver Including Roles  & Responsibilities: Yes Information Needs to be Provided By: New dx stage 1 breast cancer 6/24 and radiation was to start   Decrease burden of Care through IP rehab admission: n/a   Possible need for SNF placement upon discharge:not anticipated   Patient Condition: This patient's medical and functional status has changed since the consult dated: 02/12/23 in which the Rehabilitation Physician determined and documented that the patient's condition is appropriate for intensive rehabilitative care in an inpatient rehabilitation facility. See "History of Present Illness" (above) for medical update. Functional changes are:  overall min assist with mobility and adls. Patient's medical and functional status update has been discussed with the Rehabilitation physician and patient remains appropriate for inpatient rehabilitation. Will admit to inpatient rehab today.   Preadmission Screen Completed By:  Clois Dupes, RN MSN 02/19/2023 10:57 AM ______________________________________________________________________   Discussed status with Dr. Berline Chough on 02/19/23 at 1057 and  received approval for admission today.   Admission Coordinator:  Clois Dupes RN MSN time 1057 Date 02/19/23            Revision History

## 2023-02-19 NOTE — Progress Notes (Signed)
Inpatient Rehabilitation Admissions Coordinator   I spoke with patient and her son at bedside and with Dr Thedore Mins by phone. She is cleared for d/c to CIR today. Patient in agreement to admit today. I will make the arrangements.  Ottie Glazier, RN, MSN Rehab Admissions Coordinator [phone] 02/19/2023 3:27 PM

## 2023-02-19 NOTE — Progress Notes (Addendum)
ANTICOAGULATION CONSULT NOTE-Follow Up  Pharmacy Consult for Heparin Indication: DVT  Allergies  Allergen Reactions   Zonegran [Zonisamide] Other (See Comments)    Numbness and tingling over whole body   Chocolate Other (See Comments)    Triggers seizures   Codeine Nausea And Vomiting   Keppra [Levetiracetam] Other (See Comments)    Causes seizures   Lyrica [Pregabalin] Nausea And Vomiting   Olive Oil Rash    Patient Measurements: Weight: 56.4 kg (124 lb 5.4 oz) Heparin Dosing Weight: TBW  Vital Signs: Temp: 98.6 F (37 C) (07/18 0451) Temp Source: Axillary (07/18 0451) BP: 102/53 (07/18 0451) Pulse Rate: 64 (07/18 0451)  Labs: Recent Labs    02/17/23 0358 02/18/23 0539 02/18/23 2046 02/19/23 0612  HGB 9.7* 10.0*  --  10.0*  HCT 30.3* 30.6*  --  30.5*  PLT 124* 179  --  201  HEPARINUNFRC  --  0.15* 0.21* 0.23*  CREATININE 0.62 0.63  --  0.68    Estimated Creatinine Clearance: 63.3 mL/min (by C-G formula based on SCr of 0.68 mg/dL).   Medical History: Past Medical History:  Diagnosis Date   Acute bronchitis 08/06/2022   Acute respiratory failure with hypoxia (HCC) 03/31/2015   Altered mental status    Asthma    Ataxia 08/26/2019   Atypical chest pain 02/17/2017   Cerebral hemorrhage (HCC) 1989   Chronic back pain    Closed fracture of distal end of left radius 07/17/2022   Closed fracture of fifth metatarsal bone 01/04/2019   Closed fracture of proximal phalanx of great toe 01/04/2019   Closed nondisplaced fracture of styloid process of left radius 08/06/2022   COPD (chronic obstructive pulmonary disease) (HCC)    no meds per daughter   COPD with exacerbation (HCC) 03/30/2015   Costochondritis 02/17/2017   COVID-19 virus infection 04/02/2020   DDD (degenerative disc disease) 06/12/2013   DVT (deep venous thrombosis) (HCC)    "she's had several since 1990"   GERD (gastroesophageal reflux disease)    no meds per daughter   Headache    "usually  around time when she's had a seizure"   History of blood transfusion    "when she was a baby"   History of kidney stones    History of stomach ulcers    Hypoxia 01/18/2015   Lactic acidosis 04/02/2020   Neuropathy    Seizures (HCC)    "because of her brain surgery"  last one 07/28/22   Sepsis secondary to UTI (HCC) 01/17/2015   Stroke [facility]) 1990's   family denies residual on 11/09/2014   Tunnel vision    "since brain OR"     Assessment: 67 year old female with a 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 US findings consistent with acute deep vein thrombosis involving the right axillary vein. Previously therapeutic on heparin 950 units/hr.   Heparin level was finally collected tonight. It came back subtherapeutic. We will increase and check level in AM  7/18 AM: heparin level subtherapeutic at 0.23 on 1250 units/hr. No issues with heparin gtt documented or signs/symptoms of bleeding. CBC stable  Goal of Therapy:  Heparin level 0.3-0.5 units/ml Monitor platelets by anticoagulation protocol: Yes   Plan:  Inc heparin to 1350 units/hr 6h heparin level Monitor  for signs/symptoms of bleeding  Arabella Merles, PharmD. Clinical Pharmacist 02/19/2023 6:56 AM

## 2023-02-20 DIAGNOSIS — Z9119 Patient's noncompliance with other medical treatment and regimen due to financial hardship: Secondary | ICD-10-CM | POA: Diagnosis not present

## 2023-02-20 DIAGNOSIS — R32 Unspecified urinary incontinence: Secondary | ICD-10-CM | POA: Diagnosis not present

## 2023-02-20 DIAGNOSIS — G40909 Epilepsy, unspecified, not intractable, without status epilepticus: Principal | ICD-10-CM

## 2023-02-20 LAB — COMPREHENSIVE METABOLIC PANEL
ALT: 20 U/L (ref 0–44)
AST: 20 U/L (ref 15–41)
Albumin: 2.6 g/dL — ABNORMAL LOW (ref 3.5–5.0)
Alkaline Phosphatase: 56 U/L (ref 38–126)
Anion gap: 8 (ref 5–15)
BUN: 8 mg/dL (ref 8–23)
CO2: 19 mmol/L — ABNORMAL LOW (ref 22–32)
Calcium: 8.6 mg/dL — ABNORMAL LOW (ref 8.9–10.3)
Chloride: 110 mmol/L (ref 98–111)
Creatinine, Ser: 0.67 mg/dL (ref 0.44–1.00)
GFR, Estimated: >60 mL/min (ref >60)
Glucose, Bld: 97 mg/dL (ref 70–99)
Potassium: 3.7 mmol/L (ref 3.5–5.1)
Sodium: 137 mmol/L (ref 135–145)
Total Bilirubin: 0.1 mg/dL — ABNORMAL LOW (ref 0.3–1.2)
Total Protein: 5.8 g/dL — ABNORMAL LOW (ref 6.5–8.1)

## 2023-02-20 LAB — CBC
HCT: 32.3 % — ABNORMAL LOW (ref 36.0–46.0)
Hemoglobin: 10.3 g/dL — ABNORMAL LOW (ref 12.0–15.0)
MCH: 33.3 pg (ref 26.0–34.0)
MCHC: 31.9 g/dL (ref 30.0–36.0)
MCV: 104.5 fL — ABNORMAL HIGH (ref 80.0–100.0)
Platelets: 250 10*3/uL (ref 150–400)
RBC: 3.09 MIL/uL — ABNORMAL LOW (ref 3.87–5.11)
RDW: 12.3 % (ref 11.5–15.5)
WBC: 4.7 10*3/uL (ref 4.0–10.5)
nRBC: 0 % (ref 0.0–0.2)

## 2023-02-20 LAB — CULTURE, BLOOD (ROUTINE X 2)
Culture: NO GROWTH
Culture: NO GROWTH
Special Requests: ADEQUATE
Special Requests: ADEQUATE

## 2023-02-20 LAB — PROTIME-INR
INR: 1.1 (ref 0.8–1.2)
Prothrombin Time: 14.3 seconds (ref 11.4–15.2)

## 2023-02-20 LAB — PHENYTOIN LEVEL, TOTAL: Phenytoin Lvl: 2.7 ug/mL — ABNORMAL LOW (ref 10.0–20.0)

## 2023-02-20 MED ORDER — TOPIRAMATE 25 MG PO TABS
50.0000 mg | ORAL_TABLET | Freq: Every day | ORAL | Status: DC
  Administered 2023-02-21 – 2023-02-28 (×8): 50 mg via ORAL
  Filled 2023-02-20 (×8): qty 2

## 2023-02-20 MED ORDER — TOPIRAMATE 25 MG PO TABS
100.0000 mg | ORAL_TABLET | Freq: Every day | ORAL | Status: DC
  Administered 2023-02-20 – 2023-02-27 (×8): 100 mg via ORAL
  Filled 2023-02-20 (×8): qty 4

## 2023-02-20 MED ORDER — PHENYTOIN SODIUM EXTENDED 30 MG PO CAPS
30.0000 mg | ORAL_CAPSULE | Freq: Every day | ORAL | Status: DC
  Administered 2023-02-21 – 2023-02-28 (×8): 30 mg via ORAL
  Filled 2023-02-20 (×8): qty 1

## 2023-02-20 MED ORDER — LACOSAMIDE 200 MG PO TABS
200.0000 mg | ORAL_TABLET | Freq: Two times a day (BID) | ORAL | Status: DC
  Administered 2023-02-20 – 2023-02-28 (×14): 200 mg via ORAL
  Filled 2023-02-20 (×2): qty 1

## 2023-02-20 MED ORDER — PHENYTOIN SODIUM EXTENDED 100 MG PO CAPS
100.0000 mg | ORAL_CAPSULE | Freq: Two times a day (BID) | ORAL | Status: DC
  Administered 2023-02-21 – 2023-02-28 (×15): 100 mg via ORAL
  Filled 2023-02-20 (×18): qty 1

## 2023-02-20 MED ORDER — WARFARIN SODIUM 5 MG PO TABS
5.0000 mg | ORAL_TABLET | Freq: Once | ORAL | Status: AC
  Administered 2023-02-20: 5 mg via ORAL
  Filled 2023-02-20: qty 1

## 2023-02-20 NOTE — Progress Notes (Signed)
PROGRESS NOTE   Subjective/Complaints:  See nursing note; c/o staying in bed this AM, headache/back/neck pain, and concerned regarding ?seizure overnight (no witnessed, +urinary incontinence, + patient reported tongue biting). On exam, states pain much better, got a shower this AM and wanted to get hair brushed. No post-ictal symptoms this AM.   Discussed CT negative overniight.   ROS:  +Urinary incontinence + Pain - back, HA, neck  Denies fevers, chills, N/V, abdominal pain, constipation, diarrhea, SOB, cough, chest pain, new weakness or paraesthesias.    Objective:   CT ABDOMEN PELVIS WO CONTRAST  Result Date: 02/19/2023 CLINICAL DATA:  Flank pain EXAM: CT ABDOMEN AND PELVIS WITHOUT CONTRAST TECHNIQUE: Multidetector CT imaging of the abdomen and pelvis was performed following the standard protocol without IV contrast. RADIATION DOSE REDUCTION: This exam was performed according to the departmental dose-optimization program which includes automated exposure control, adjustment of the mA and/or kV according to patient size and/or use of iterative reconstruction technique. COMPARISON:  02/15/2023 FINDINGS: Lower chest: Visualized lower lung fields are unremarkable. Minimal pericardial effusion is seen. Hepatobiliary: Liver measures 19.4 cm in length. There is no dilation of bile ducts. Gallbladder is not distended. Pancreas: No focal abnormalities are seen. Spleen: Unremarkable. Adrenals/Urinary Tract: Adrenals are unremarkable. There is no hydronephrosis. There are no renal or ureteral stones. Urinary bladder is unremarkable. Stomach/Bowel: Stomach is unremarkable the small bowel loops are not dilated. Appendix is not dilated. There is no significant wall thickening in colon. There is no pericolic stranding. Vascular/Lymphatic: Arterial calcifications are seen in aorta and its major branches. Inferior vena cava filter is seen. Reproductive:  Unremarkable. Other: There is no ascites or pneumoperitoneum. There is low-density nodular density in retrocrural region which has not changed significantly in comparison with previous studies dating as far back as 05/20/2010 suggesting benign process. Umbilical hernia containing fat is seen. Small pockets of air in subcutaneous plane in the anterior abdominal wall may be related to parenteral administration of medication. There are ectatic vessels in subcutaneous plane in both inguinal regions which appear stable. Right inguinal hernia containing fat is seen. Musculoskeletal: Slight decrease in height of few vertebral bodies at the thoracolumbar junction has not changed. IMPRESSION: There is no evidence of intestinal obstruction or pneumoperitoneum. There is no hydronephrosis. Appendix is not dilated. Aortic arteriosclerosis. Other findings as described in the body of the report. Electronically Signed   By: Ernie Avena M.D.   On: 02/19/2023 14:09   Recent Labs    02/19/23 0612 02/20/23 0610  WBC 4.5 4.7  HGB 10.0* 10.3*  HCT 30.5* 32.3*  PLT 201 250   Recent Labs    02/19/23 0612 02/20/23 0610  NA 137 137  K 3.6 3.7  CL 106 110  CO2 22 19*  GLUCOSE 100* 97  BUN 8 8  CREATININE 0.68 0.67  CALCIUM 8.5* 8.6*   No intake or output data in the 24 hours ending 02/20/23 1240      Physical Exam: Vital Signs Blood pressure (!) 105/46, pulse 64, temperature 98.6 F (37 C), temperature source Oral, resp. rate 16, height 5' 7.75" (1.721 m), weight 60.6 kg, SpO2 100%.   PE: Constitution: Appropriate appearance  for age. No apparent distress. Sitting up in bedside chair.  HEENT: Atraumatic, normocaephalic. No apparent bite marks on tongue, cheeks.  Resp: No respiratory distress. No accessory muscle usage. on RA and CTAB Cardio: Well perfused appearance. No peripheral edema. Abdomen: Nondistended. Nontender.   Psych: Appropriate mood and affect.  Neuro: AAOx4. No apparent cognitive  deficits.   Neurologic Exam:   DTR: WNL Babinsky: flexor responses b/l.   Hoffmans: negative b/l Sensory exam: revealed normal sensation in all dermatomal regions in bilateral upper extremities and bilateral lower extremities Motor exam: strength 5-/5 throughout bilateral upper extremities and bilateral lower extremities  Coordination: Fine motor coordination was normal.        Assessment/Plan: 1. Functional deficits which require 3+ hours per day of interdisciplinary therapy in a comprehensive inpatient rehab setting. Physiatrist is providing close team supervision and 24 hour management of active medical problems listed below. Physiatrist and rehab team continue to assess barriers to discharge/monitor patient progress toward functional and medical goals  Care Tool:  Bathing    Body parts bathed by patient: Right arm, Left arm, Chest, Abdomen, Front perineal area, Buttocks, Left lower leg, Right lower leg, Right upper leg, Left upper leg, Face         Bathing assist Assist Level: Minimal Assistance - Patient > 75%     Upper Body Dressing/Undressing Upper body dressing   What is the patient wearing?: Pull over shirt    Upper body assist Assist Level: Minimal Assistance - Patient > 75%    Lower Body Dressing/Undressing Lower body dressing      What is the patient wearing?: Underwear/pull up, Pants     Lower body assist Assist for lower body dressing: Minimal Assistance - Patient > 75%     Toileting Toileting    Toileting assist Assist for toileting: Minimal Assistance - Patient > 75%     Transfers Chair/bed transfer  Transfers assist     Chair/bed transfer assist level: Minimal Assistance - Patient > 75%     Locomotion Ambulation   Ambulation assist      Assist level: Minimal Assistance - Patient > 75% Assistive device: Walker-rolling Max distance: 130   Walk 10 feet activity   Assist     Assist level: Minimal Assistance - Patient >  75% Assistive device: Walker-rolling   Walk 50 feet activity   Assist    Assist level: Minimal Assistance - Patient > 75% Assistive device: Walker-rolling    Walk 150 feet activity   Assist           Walk 10 feet on uneven surface  activity   Assist           Wheelchair     Assist Is the patient using a wheelchair?: Yes Type of Wheelchair: Manual    Wheelchair assist level: Minimal Assistance - Patient > 75% Max wheelchair distance: 150    Wheelchair 50 feet with 2 turns activity    Assist        Assist Level: Minimal Assistance - Patient > 75%   Wheelchair 150 feet activity     Assist      Assist Level: Minimal Assistance - Patient > 75%   Blood pressure (!) 105/46, pulse 64, temperature 98.6 F (37 C), temperature source Oral, resp. rate 16, height 5' 7.75" (1.721 m), weight 60.6 kg, SpO2 100%.  Medical Problem List and Plan: 1. Functional deficits secondary to seizures- ran out meds due to insurance issues with more cognitive deficits and  increased weakness above baseline             -patient may  shower             -ELOS/Goals: 2-2.5 weeks- min A to supervision             Admit to CIR   2.  Antithrombotics: -DVT/anticoagulation:  Pharmaceutical: Cannot have Eliquis or Xarelto due to interaction with Phenytoin- will change to tx dose lovenox and transition to Coumadin.              -antiplatelet therapy: none   3. Pain Management: Tylenol as needed (no tramadol)             -Lidoderm as needed   4. Mood/Behavior/Sleep: LCSW to evaluate and provide emotional support (no trazodone)             -antipsychotic agents: n/a   5. Neuropsych/cognition: This patient is capable of making decisions on her own behalf.   6. Skin/Wound Care: Routine skin care checks    - No apparent lesions in mouth, tongue s/p ?seizure  7. 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 Discussed with Dr. 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 CVA tenderness/flank pain-= CT done- was negative for any issues  17. Constipation- will order Sorbitol 30cc    - Large BM 7/19  LOS: 1 days A FACE TO FACE EVALUATION WAS PERFORMED  Angelina Sheriff 02/20/2023, 12:40 PM

## 2023-02-20 NOTE — Progress Notes (Signed)
Patient's daughter brought home medications that they prefer since it is not generic for Vimpat and Dilantin. Dr. Shearon Stalls notified about this and was ok using patient's home med use. Patient med list done and sent to pharmacy.

## 2023-02-20 NOTE — Progress Notes (Signed)
Occupational Therapy Assessment and Plan  Patient Details  Name: [name] MRN: [id number] Date of Birth: [birth year]  OT Diagnosis: ataxia and muscle weakness (generalized) Rehab Potential: Rehab Potential (ACUTE ONLY): Excellent ELOS: 7-10   Today's Date: 02/20/2023 OT Individual Time: 6213-0865 OT Individual Time Calculation (min): 72 min     Hospital Problem: Principal Problem:   Seizure disorder (HCC) Active Problems:   Debility   Past Medical History:  Past Medical History:  Diagnosis Date   Acute bronchitis 08/06/2022   Acute respiratory failure with hypoxia (HCC) 03/31/2015   Altered mental status    Asthma    Ataxia 08/26/2019   Atypical chest pain 02/17/2017   Cerebral hemorrhage (HCC) 1989   Chronic back pain    Closed fracture of distal end of left radius 07/17/2022   Closed fracture of fifth metatarsal bone 01/04/2019   Closed fracture of proximal phalanx of great toe 01/04/2019   Closed nondisplaced fracture of styloid process of left radius 08/06/2022   COPD (chronic obstructive pulmonary disease) (HCC)    no meds per daughter   COPD with exacerbation (HCC) 03/30/2015   Costochondritis 02/17/2017   COVID-19 virus infection 04/02/2020   DDD (degenerative disc disease) 06/12/2013   DVT (deep venous thrombosis) (HCC)    "she's had several since 1990"   GERD (gastroesophageal reflux disease)    no meds per daughter   Headache    "usually around time when she's had a seizure"   History of blood transfusion    "when she was a baby"   History of kidney stones    History of stomach ulcers    Hypoxia 01/18/2015   Lactic acidosis 04/02/2020   Neuropathy    Seizures (HCC)    "because of her brain surgery"  last one 07/28/22   Sepsis secondary to UTI (HCC) 01/17/2015   Stroke [facility]) 1990's   family denies residual on 11/09/2014   Tunnel vision    "since brain OR"   Past Surgical History:  Past Surgical History:  Procedure Laterality Date   BRAIN  SURGERY  1989   craniotomy with hematoma evacuation   BREAST BIOPSY Left 12/22/2022   [place] LT BREAST BX W LOC DEV 1ST LESION IMG BX SPEC US GUIDE 12/22/2022 [facility]   BREAST BIOPSY  01/09/2023   MM LT RADIOACTIVE SEED LOC MAMMO GUIDE 01/09/2023 [facility]   BREAST LUMPECTOMY WITH RADIOACTIVE SEED AND SENTINEL LYMPH NODE BIOPSY Left 01/12/2023   Procedure: LEFT BREAST LUMPECTOMY WITH RADIOACTIVE SEED AND SENTINEL LYMPH NODE BIOPSY;  Surgeon: Manus Rudd, MD;  Location: [facility];  Service: General;  Laterality: Left;   CESAREAN SECTION  1979; 1987; 1989   HEMORRHOID SURGERY     IVC FILTER INSERTION N/A 10/12/2020   Procedure: IVC FILTER INSERTION;  Surgeon: Chuck Hint, MD;  Location: [facility] INVASIVE CV LAB;  Service: Cardiovascular;  Laterality: N/A;   OPEN REDUCTION INTERNAL FIXATION (ORIF) DISTAL RADIAL FRACTURE Left 08/25/2022   Procedure: OPEN REDUCTION INTERNAL FIXATION (ORIF) DISTAL RADIUS FRACTURE of nascent malunion;  Surgeon: Bradly Bienenstock, MD;  Location: [facility] OR;  Service: Orthopedics;  Laterality: Left;  regional with iv sedation   TUBAL LIGATION  1990's    Assessment & Plan Clinical Impression: Patient is a 67 y.o. year old female with recent admission to the hospital on 02/09/2023 after multiple seizures at home. Pt with another seizure on morning of 7/9. PMH: cerebral hemorrhage 1989, asthma, COPD, DVT, GERD, seizures, CVA, tunnel vision, neuropathy.  Patient transferred to CIR on 02/19/2023 .    Patient currently requires min with basic self-care skills secondary to muscle weakness, decreased cardiorespiratoy endurance, and decreased standing balance, decreased postural control, and decreased balance strategies.  Prior to hospitalization, patient could complete BADL with modified independent .  Patient will benefit from skilled intervention to increase independence with basic self-care skills prior to discharge home with care partner.  Anticipate  patient will require 24 hour supervision and follow up outpatient.  OT - End of Session Endurance Deficit: Yes Endurance Deficit Description: Rest breaks within BADL tasks OT Assessment Rehab Potential (ACUTE ONLY): Excellent OT Patient demonstrates impairments in the following area(s): Balance;Endurance;Cognition;Safety OT Basic ADL's Functional Problem(s): Toileting;Dressing;Bathing;Grooming;Eating OT Transfers Functional Problem(s): Toilet;Tub/Shower OT Additional Impairment(s): Fuctional Use of Upper Extremity OT Plan OT Intensity: Minimum of 1-2 x/day, 45 to 90 minutes OT Frequency: 5 out of 7 days OT Duration/Estimated Length of Stay: 7-10 OT Treatment/Interventions: Balance/vestibular training;Cognitive remediation/compensation;Community reintegration;Discharge planning;Disease mangement/prevention;DME/adaptive equipment instruction;Functional electrical stimulation;Functional mobility training;Neuromuscular re-education;Pain management;Patient/family education;Psychosocial support;Self Care/advanced ADL retraining;Skin care/wound managment;Splinting/orthotics;Therapeutic Activities;Therapeutic Exercise;UE/LE Strength taining/ROM;UE/LE Coordination activities;Visual/perceptual remediation/compensation;Wheelchair propulsion/positioning OT Self Feeding Anticipated Outcome(s): Mod I OT Basic Self-Care Anticipated Outcome(s): Mod I/supervision OT Toileting Anticipated Outcome(s): Supervision OT Bathroom Transfers Anticipated Outcome(s): Supervision OT Recommendation Patient destination: Home Follow Up Recommendations: Outpatient OT Equipment Recommended: To be determined Equipment Details: TBD   OT Evaluation Precautions/Restrictions  Precautions Precautions: Fall Precaution Comments: EEG Restrictions Weight Bearing Restrictions: No Vital Signs Therapy Vitals Pulse Rate: 91 BP: 103/64 Pain Pain Assessment Pain Scale: 0-10 Pain Score: 4  Pain Location: Back Pain  Orientation: Lower Pain Intervention(s): Medication (See eMAR) Home Living/Prior Functioning Home Living Living Arrangements: Children Available Help at Discharge: Family, Available 24 hours/day Type of Home: House Home Access: Level entry Entrance Stairs-Number of Steps: level entry Entrance Stairs-Rails: None Home Layout: Two level Alternate Level Stairs-Number of Steps: flight (pt has a stair lift) Alternate Level Stairs-Rails: Right Bathroom Shower/Tub: Health visitor: Standard Bathroom Accessibility: Yes Additional Comments: Pt has a stair lift and a shower seat  Lives With: Daughter (per chart review) IADL History Homemaking Responsibilities: No Current License: No IADL Comments: Enjoys riding motorcycles, but has not ridden them in a while. Enjoys playing with grandkids nad kitty cats Prior Function Level of Independence: Independent with basic ADLs, Needs assistance with homemaking Vision Baseline Vision/History: 1 Wears glasses Ability to See in Adequate Light: 1 Impaired Patient Visual Report: Other (comment) (Pt reports no peripheral vision, pt wears glasses for reading) Perception  Perception: Within Functional Limits Praxis Praxis: Intact Cognition Cognition Overall Cognitive Status: History of cognitive impairments - at baseline Arousal/Alertness: Awake/alert Orientation Level: Person;Situation;Place Person: Oriented Place: Oriented Situation: Oriented Memory: Impaired Awareness: Impaired Problem Solving: Impaired Safety/Judgment: Impaired Brief Interview for Mental Status (BIMS) Repetition of Three Words (First Attempt): 3 Temporal Orientation: Year: Correct Temporal Orientation: Month: Accurate within 5 days Temporal Orientation: Day: Incorrect Recall: "Sock": Yes, after cueing ("something to wear") Recall: "Blue": Yes, no cue required Recall: "Bed": No, could not recall BIMS Summary Score: 11 Sensation Sensation Light Touch:  Appears Intact Coordination Gross Motor Movements are Fluid and Coordinated: No Fine Motor Movements are Fluid and Coordinated: No Coordination and Movement Description: decreased smoothness and accuracy, dysdiadakokinesia, B UE tremors Finger Nose Finger Test: dysmetria Motor  Motor Motor: Ataxia Motor - Skilled Clinical Observations: coordination deficits and generalized deconditioning  Balance Balance Balance Assessed: Yes Static Sitting Balance Static Sitting - Balance Support: Feet supported Static Sitting - Level  of Assistance: 5: Stand by assistance Dynamic Sitting Balance Dynamic Sitting - Balance Support: Feet supported Dynamic Sitting - Level of Assistance: 5: Stand by assistance Sitting balance - Comments: no overt LOB Static Standing Balance Static Standing - Level of Assistance: 4: Min assist Dynamic Standing Balance Dynamic Standing - Level of Assistance: 4: Min assist Extremity/Trunk Assessment RUE Assessment RUE Assessment: Within Functional Limits LUE Assessment LUE Assessment: Exceptions to [facility] Strength Comments: 4/5  Care Tool Care Tool Self Care Eating   Eating Assist Level: Supervision/Verbal cueing    Oral Care    Oral Care Assist Level: Contact Guard/Toucning assist    Bathing   Body parts bathed by patient: Right arm;Left arm;Chest;Abdomen;Front perineal area;Buttocks;Left lower leg;Right lower leg;Right upper leg;Left upper leg;Face     Assist Level: Minimal Assistance - Patient > 75%    Upper Body Dressing(including orthotics)   What is the patient wearing?: Pull over shirt   Assist Level: Minimal Assistance - Patient > 75%    Lower Body Dressing (excluding footwear)   What is the patient wearing?: Underwear/pull up;Pants Assist for lower body dressing: Minimal Assistance - Patient > 75%    Putting on/Taking off footwear   What is the patient wearing?: Non-skid slipper socks Assist for footwear: Minimal Assistance - Patient >  75%       Care Tool Toileting Toileting activity   Assist for toileting: Minimal Assistance - Patient > 75%     Care Tool Bed Mobility Roll left and right activity        Sit to lying activity   Sit to lying assist level: Supervision/Verbal cueing    Lying to sitting on side of bed activity   Lying to sitting on side of bed assist level: the ability to move from lying on the back to sitting on the side of the bed with no back support.: Supervision/Verbal cueing     Care Tool Transfers Sit to stand transfer   Sit to stand assist level: Minimal Assistance - Patient > 75%    Chair/bed transfer   Chair/bed transfer assist level: Minimal Assistance - Patient > 75%     Toilet transfer   Assist Level: Minimal Assistance - Patient > 75%     Care Tool Cognition  Expression of Ideas and Wants Expression of Ideas and Wants: 4. Without difficulty (complex and basic) - expresses complex messages without difficulty and with speech that is clear and easy to understand  Understanding Verbal and Non-Verbal Content Understanding Verbal and Non-Verbal Content: 4. Understands (complex and basic) - clear comprehension without cues or repetitions   Memory/Recall Ability Memory/Recall Ability : Current season;Location of own room;Staff names and faces   Refer to Care Plan for Long Term Goals  SHORT TERM GOAL WEEK 1 OT Short Term Goal 1 (Week 1): LTG=STG 2/2 ELOS  Recommendations for other services: None    Skilled Therapeutic Intervention Pt greeted semi-reclined in bed. OT eval completed addressing rehab process, OT purpose, POC, ELOS, and goals.  Pt ambulated to bathroom without AD and min A. PT voided bowel and bladder. Pt wiping back to front. OT educated on increased risk of UTI wiping this way and educated on importance of wiping front to back. Pt demonstrated understanding. Pt completed bathing tasks at shower level and dressing at EOB with overall min A. See navigator below for further  details regarding BADL performance. Pt with generalized weakness, B UE coordination deficits, and ataxia when ambulating. Pt left semi-reclined in bed at  end of session with bed alamr on, call bell in reach, and needs met.   ADL ADL Eating: Supervision/safety Grooming: Contact guard Upper Body Bathing: Supervision/safety Lower Body Bathing: Minimal assistance Upper Body Dressing: Minimal assistance Lower Body Dressing: Minimal assistance Toileting: Minimal assistance Toilet Transfer: Minimal assistance Walk-In Shower Transfer: Minimal assistance Mobility  Bed Mobility Bed Mobility: Sit to Supine;Supine to Sit Supine to Sit: Supervision/Verbal cueing Sit to Supine: Supervision/Verbal cueing Transfers Sit to Stand: Minimal Assistance - Patient > 75% Stand to Sit: Minimal Assistance - Patient > 75%   Discharge Criteria: Patient will be discharged from OT if patient refuses treatment 3 consecutive times without medical reason, if treatment goals not met, if there is a change in medical status, if patient makes no progress towards goals or if patient is discharged from hospital.  The above assessment, treatment plan, treatment alternatives and goals were discussed and mutually agreed upon: by patient  [name] 02/20/2023, 10:10 AM

## 2023-02-20 NOTE — Progress Notes (Signed)
Physical Therapy Session Note  Patient Details  Name: [name] MRN: [id number] Date of Birth: 10/12/1955  Today's Date: 02/20/2023 PT Individual Time: 9528-4132 PT Individual Time Calculation (min): 44 min   Short Term Goals: Week 1:  PT Short Term Goal 1 (Week 1): Pt will transfer sit to stand w/ CGA. PT Short Term Goal 2 (Week 1): Pt will amb 150' w/ CGA and LRAD. PT Short Term Goal 3 (Week 1): Pt will negotiate 12 steps w/ 1 rail and CGA.  Skilled Therapeutic Interventions/Progress Updates:  Ambulation/gait training;Cognitive remediation/compensation;Discharge planning;Functional mobility training;Pain management;Therapeutic Activities;UE/LE Strength taining/ROM;Balance/vestibular training;Community reintegration;Neuromuscular re-education;Patient/family education;Stair training;Therapeutic Exercise;UE/LE Coordination activities   Pt received seated in WC and agrees to therapy. Reports pain in Rt foot. Number not provided. PT provides rest breaks as needed to manage pain. Pt performs sit to stand with minA and ambulates to toilet with RW and minA, transferring to toilet with minA and cues for positioning. Pt continent of urine and requires minA for stability as she performs pericare. Pt ambulates to sink to wash hands then transfers to [facility] with minA and cues for hand placement. WC transport to gym. Pt completes Berg balance test, as detailed below. Following, pt ambulates x175' with RW and cues for upright gaze to improve posture and balance, and increasing stride length and gait speed to decrease risk for falls. Pt performs stand step back to bed with minA. Left supine with alarm intact and all needs within reach.   Therapy Documentation Precautions:  Precautions Precautions: Fall Precaution Comments: seizure Restrictions Weight Bearing Restrictions: No Balance: Balance Balance Assessed: Yes Standardized Balance Assessment Standardized Balance Assessment: Berg Balance Test Berg  Balance Test Sit to Stand: Needs minimal aid to stand or to stabilize Standing Unsupported: Able to stand 2 minutes with supervision Sitting with Back Unsupported but Feet Supported on Floor or Stool: Able to sit safely and securely 2 minutes Stand to Sit: Sits independently, has uncontrolled descent Transfers: Needs one person to assist Standing Unsupported with Eyes Closed: Able to stand 10 seconds with supervision Standing Ubsupported with Feet Together: Needs help to attain position and unable to hold for 15 seconds From Standing, Reach Forward with Outstretched Arm: Reaches forward but needs supervision From Standing Position, Pick up Object from Floor: Able to pick up shoe, needs supervision From Standing Position, Turn to Look Behind Over each Shoulder: Turn sideways only but maintains balance Turn 360 Degrees: Needs close supervision or verbal cueing Standing Unsupported, Alternately Place Feet on Step/Stool: Needs assistance to keep from falling or unable to try Standing Unsupported, One Foot in Front: Loses balance while stepping or standing Standing on One Leg: Unable to try or needs assist to prevent fall Total Score: 20 Static Sitting Balance Static Sitting - Balance Support: Feet supported Static Sitting - Level of Assistance: 5: Stand by assistance Dynamic Sitting Balance Dynamic Sitting - Balance Support: Feet supported Dynamic Sitting - Level of Assistance: 5: Stand by assistance Sitting balance - Comments: no overt LOB Static Standing Balance Static Standing - Balance Support: Bilateral upper extremity supported;No upper extremity supported Static Standing - Level of Assistance: 4: Min assist Dynamic Standing Balance Dynamic Standing - Balance Support: No upper extremity supported;Bilateral upper extremity supported Dynamic Standing - Level of Assistance: 4: Min assist   Therapy/Group: Individual Therapy  Beau Fanny, PT, DPT 02/20/2023, 4:52 PM

## 2023-02-20 NOTE — Progress Notes (Addendum)
Patient ID: [name], female   DOB: Jul 05, 1956, 67 y.o.   MRN: [id number]  [id number] left message for pt dtr [name] requesting return phone call due business phone line. SW waiting on follow-up.  *SW returned phone call to pt dtr [name]. She confirms she and her family are able to provide care her mother needs. Husband and children are supportive. She also reported when she was reading mychart and she saw that pt was incontinent- states this usually happens when she has had a seizure in her sleep. She said she was not sure if her dantrolene levels need to be checked. Also states, that if she is on generic dantrolene she has changes in symptoms as well. She has volunteered to bring in brand name for this medication. SW discussed discharge process, and SW will follow-up with updates as available.   SW relayed medical concerns to medical team.   Cecile Sheerer, MSW, LCSWA Office: [phone] Cell: [phone] Fax: [phone]

## 2023-02-20 NOTE — Progress Notes (Signed)
Inpatient Rehabilitation  Patient information reviewed and entered into eRehab system by Melissa M. Bowie, M.A., CCC/SLP, PPS Coordinator.  Information including medical coding, functional ability and quality indicators will be reviewed and updated through discharge.    

## 2023-02-20 NOTE — Progress Notes (Signed)
Inpatient Rehabilitation Admission Medication Review by a Pharmacist  A complete drug regimen review was completed for this patient to identify any potential clinically significant medication issues.  High Risk Drug Classes Is patient taking? Indication by Medication  Antipsychotic No   Anticoagulant Yes Lovenox / Warfarin -  DVT  Antibiotic No   Opioid No   Antiplatelet No   Hypoglycemics/insulin No   Vasoactive Medication Yes Diltiazem - PAF  Chemotherapy No   Other Yes Gabapentin - pain Lacosamide, topiramate, phenytoin-seizures  PRN Zofran - N/V Oxybutynin - incontinence     Type of Medication Issue Identified Description of Issue Recommendation(s)  Drug Interaction(s) (clinically significant)     Duplicate Therapy     Allergy     No Medication Administration End Date     Incorrect Dose     Additional Drug Therapy Needed     Significant med changes from prior encounter (inform family/care partners about these prior to discharge).    Other       Clinically significant medication issues were identified that warrant physician communication and completion of prescribed/recommended actions by midnight of the next day:  No  Name of provider notified for urgent issues identified: None  Time spent performing this drug regimen review (minutes):  20 minutes Okey Regal, PharmD

## 2023-02-20 NOTE — Progress Notes (Signed)
Patient slept well last night. Woke up about 4:20 am and was incontinent  of urine. Patient said that normally occurs when she has a seizure. Assessed and found to be alert and oriented X 4. No injuries noted. No observable biting of tongue. Requested tylenol for headache. Was assisted with self care. Safety maintained.

## 2023-02-20 NOTE — Plan of Care (Signed)
  Problem: Sit to Stand Goal: LTG:  Patient will perform sit to stand in prep for activites of daily living with assistance level (OT) Description: LTG:  Patient will perform sit to stand in prep for activites of daily living with assistance level (OT) Flowsheets (Taken 02/20/2023 1150) LTG: PT will perform sit to stand in prep for activites of daily living with assistance level: Independent with assistive device   Problem: RH Eating Goal: LTG Patient will perform eating w/assist, cues/equip (OT) Description: LTG: Patient will perform eating with assist, with/without cues using equipment (OT) Flowsheets (Taken 02/20/2023 1150) LTG: Pt will perform eating with assistance level of: Independent with assistive device    Problem: RH Grooming Goal: LTG Patient will perform grooming w/assist,cues/equip (OT) Description: LTG: Patient will perform grooming with assist, with/without cues using equipment (OT) Flowsheets (Taken 02/20/2023 1150) LTG: Pt will perform grooming with assistance level of: Independent with assistive device    Problem: RH Bathing Goal: LTG Patient will bathe all body parts with assist levels (OT) Description: LTG: Patient will bathe all body parts with assist levels (OT) Flowsheets (Taken 02/20/2023 1150) LTG: Pt will perform bathing with assistance level/cueing: Supervision/Verbal cueing   Problem: RH Dressing Goal: LTG Patient will perform lower body dressing w/assist (OT) Description: LTG: Patient will perform lower body dressing with assist, with/without cues in positioning using equipment (OT) Flowsheets (Taken 02/20/2023 1150) LTG: Pt will perform lower body dressing with assistance level of: Independent with assistive device   Problem: RH Toileting Goal: LTG Patient will perform toileting task (3/3 steps) with assistance level (OT) Description: LTG: Patient will perform toileting task (3/3 steps) with assistance level (OT)  Flowsheets (Taken 02/20/2023 1150) LTG: Pt  will perform toileting task (3/3 steps) with assistance level: Supervision/Verbal cueing   Problem: RH Functional Use of Upper Extremity Goal: LTG Patient will use RT/LT upper extremity as a (OT) Description: LTG: Patient will use right/left upper extremity as a stabilizer/gross assist/diminished/nondominant/dominant level with assist, with/without cues during functional activity (OT) Flowsheets (Taken 02/20/2023 1150) LTG: Use of upper extremity in functional activities:  RUE as dominant level  LUE as nondominant level   Problem: RH Toilet Transfers Goal: LTG Patient will perform toilet transfers w/assist (OT) Description: LTG: Patient will perform toilet transfers with assist, with/without cues using equipment (OT) Flowsheets (Taken 02/20/2023 1150) LTG: Pt will perform toilet transfers with assistance level of: Supervision/Verbal cueing   Problem: RH Tub/Shower Transfers Goal: LTG Patient will perform tub/shower transfers w/assist (OT) Description: LTG: Patient will perform tub/shower transfers with assist, with/without cues using equipment (OT) Flowsheets (Taken 02/20/2023 1150) LTG: Pt will perform tub/shower stall transfers with assistance level of: Supervision/Verbal cueing
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
## 2023-02-20 NOTE — Progress Notes (Signed)
Met with patient this am. Upset that still in the bed. Reports that has been up since 0400.  This is her normal time to get out of bed. Reports pain to back and neck due to bed. Will let staff know to try and get up when doing VS in Am.  She is currently waiting on therapy.  Pain addressed with nursing.  Reports that has been in bed too long and not used to staying in bed. Discussed team conference on every Tuesday to talk about barriers, goals, equipment if any needed, and discharge date. Reports that quit smoking about 10 years ago. She reports that insurance will now cover Vimpat and should not be an issue going forward. Taking coumadin instead of Eliquis.  Will provide coumadin education and put in binder. Did touch on vitamin K and dark green vegetables but at same time to eat consistently or will alter levels but be mindful. York Spaniel that daughter does the cooking. Also discussed diet changes to improve cholesterol levels. All needs met, call bell in reach.

## 2023-02-20 NOTE — Progress Notes (Signed)
ANTICOAGULATION CONSULT NOTE-Follow Up  Pharmacy Consult: Lovenox / Warfarin  Indication: DVT  Allergies  Allergen Reactions   Zonegran [Zonisamide] Other (See Comments)    Numbness and tingling over whole body   Chocolate Other (See Comments)    Triggers seizures   Codeine Nausea And Vomiting   Keppra [Levetiracetam] Other (See Comments)    Causes seizures   Lyrica [Pregabalin] Nausea And Vomiting   Olive Oil Rash    Patient Measurements: Height: 5' 7.75" (172.1 cm) Weight: 60.6 kg (133 lb 9.6 oz) IBW/kg (Calculated) : 63.33 Heparin Dosing Weight: TBW  Vital Signs: BP: 98/65 (07/19 0400) Pulse Rate: 76 (07/19 0400)  Labs: Recent Labs    02/18/23 0539 02/18/23 2046 02/19/23 0612 02/20/23 0610  HGB 10.0*  --  10.0* 10.3*  HCT 30.6*  --  30.5* 32.3*  PLT 179  --  201 250  LABPROT  --   --   --  14.3  INR  --   --   --  1.1  HEPARINUNFRC 0.15* 0.21* 0.23*  --   CREATININE 0.63  --  0.68  --     Estimated Creatinine Clearance: 68 mL/min (by C-G formula based on SCr of 0.68 mg/dL).     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  Now on Lovenox and Warfarin   Goal of Therapy:  INR 2 to 3 Monitor platelets by anticoagulation protocol: Yes   Plan:  Lovenox 60mg  SQ q12h until INR =/>2 (x2) Warfarin 5 mg po x 1 today  Daily INR  CBC q72h  Copay for Warfarin  is $0.00   Thank you Okey Regal, PharmD 02/20/2023 8:12 AM
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
## 2023-02-21 ENCOUNTER — Inpatient Hospital Stay (HOSPITAL_COMMUNITY): Payer: Medicare HMO

## 2023-02-21 LAB — PROTIME-INR
INR: 1.2 (ref 0.8–1.2)
Prothrombin Time: 15.2 seconds (ref 11.4–15.2)

## 2023-02-21 MED ORDER — WARFARIN SODIUM 7.5 MG PO TABS
7.5000 mg | ORAL_TABLET | Freq: Once | ORAL | Status: AC
  Administered 2023-02-21: 7.5 mg via ORAL
  Filled 2023-02-21: qty 1

## 2023-02-21 MED ORDER — LACOSAMIDE 200 MG PO TABS
ORAL_TABLET | ORAL | Status: AC
  Administered 2023-02-21: 200 mg via ORAL
  Filled 2023-02-21: qty 1

## 2023-02-21 NOTE — Progress Notes (Signed)
Notified [address], [place] of x-ray results.      Tilden Dome, LPN

## 2023-02-21 NOTE — Progress Notes (Addendum)
Therapy reports patient's complaint of pain in the arch of her right foot. PRN medication given per MD order. New order placed for x-ray. POC in progress.    Tilden Dome, LPN

## 2023-02-21 NOTE — Progress Notes (Signed)
Physical Therapy Session Note  Patient Details  Name: [name] MRN: [id number] Date of Birth: 29-Apr-1956  Today's Date: 02/21/2023 PT Individual Time: 1103-1200 and 4098-1191 PT Individual Time Calculation (min): 57 min and 71 min  Short Term Goals: Week 1:  PT Short Term Goal 1 (Week 1): Pt will transfer sit to stand w/ CGA. PT Short Term Goal 2 (Week 1): Pt will amb 150' w/ CGA and LRAD. PT Short Term Goal 3 (Week 1): Pt will negotiate 12 steps w/ 1 rail and CGA.  Skilled Therapeutic Interventions/Progress Updates:     Pt received seated in [facility] and agrees to therapy. Reports pain in Rt foot. PT provides rest breaks as needed, also alerting RN and physician to pt's pain. Pt performs ambulatory transfer to toilet with RW and minA for safe stand to sit on low toilet seat. Pt is continent of bowel and bladder and performs pericare with PT providing minA at trunk for stability in standing. Following, pt cued to ambulate to sink to wash hands, then back to [facility] with RW and CGA. WC transport to gym. Pt ambulates x350' with RW and CGA, with cues for upright gaze to improve posture and balance, increasing proximity to RW for safety, and decreasing WB through RW for energy conservation and body mechanics. Pt takes extended seated rest break, then ambulates additional x175' without AD, requiring minA at trunk for stability and demonstrating consistent slight Rt sided lean. WC transport back to room. Stand step to bed without AD and with minA and cues for positioning. Left seated in bed with alarm intact and call bell in reach.   2nd Session: Pt received seated in bed. Reports pain in Rt foot. PT provides rest breaks and mobility to manage pain. Pt performs sit to stand with minA due to posterior bias, then ambulates to toilet without AD, with minA and cues for positioning. Pt ambulates to sink following toileting with minA, and washes hands with close supervision. WC transport to gym. Stand step to Nustep  with minA and cues for hand placement and positioning. Pt completes Nustep for endurance training and reciprocal coordination. PT provides cues for hand and foot placement and completing full available ROM. PT completes at workload of 5 for 10:00 with average steps per minute ~35. Following, pt ambulates x350 without AD, requiring minA for stability andd demonstrating significant antalgic gait pattern with Rt foot pain. WC transport outside. Pt performs gait training outdoor for gait over unlevel and varying surfaces. Pt ambulates x200' with minA/modA and cues for posture, trunk rotation, and Lt' lateral weight shifting. WC transport back to room. Stand step to bed with minA. Left seated in bed with alarm intact and all needs within reach.   Therapy Documentation Precautions:  Precautions Precautions: Fall Precaution Comments: seizure Restrictions Weight Bearing Restrictions: No    Therapy/Group: Individual Therapy  Beau Fanny, PT, DPT 02/21/2023, 4:47 PM

## 2023-02-21 NOTE — Progress Notes (Signed)
ANTICOAGULATION CONSULT NOTE  Pharmacy Consult: Lovenox / Warfarin  Indication: DVT  Allergies  Allergen Reactions   Zonegran [Zonisamide] Other (See Comments)    Numbness and tingling over whole body   Chocolate Other (See Comments)    Triggers seizures   Codeine Nausea And Vomiting   Keppra [Levetiracetam] Other (See Comments)    Causes seizures   Lyrica [Pregabalin] Nausea And Vomiting   Olive Oil Rash    Patient Measurements: Height: 5' 7.75" (172.1 cm) Weight: 60.6 kg (133 lb 9.6 oz) IBW/kg (Calculated) : 63.33  Vital Signs: Temp: 98.9 F (37.2 C) (07/20 0445) Temp Source: Oral (07/20 0445) BP: 101/45 (07/20 0445) Pulse Rate: 73 (07/20 0445)  Labs: Recent Labs    02/18/23 2046 02/19/23 0612 02/20/23 0610 02/21/23 0528  HGB  --  10.0* 10.3*  --   HCT  --  30.5* 32.3*  --   PLT  --  201 250  --   LABPROT  --   --  14.3 15.2  INR  --   --  1.1 1.2  HEPARINUNFRC 0.21* 0.23*  --   --   CREATININE  --  0.68 0.67  --     Estimated Creatinine Clearance: 68 mL/min (by C-G formula based on SCr of 0.67 mg/dL).   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  Now on Warfarin wit enoxaparin bridge. CBC stable, platelets 250. No issues with bleeding reported. INR remains subtherapeutic 1.2. Increase warfarin dose\, continue enoxaparin bridge.  Goal of Therapy:  INR 2 to 3 Monitor platelets by anticoagulation protocol: Yes   Plan:   Lovenox 60mg  SQ q12h until INR =/>2 Give warfarin 7.5 mg PO x1 dose Check INR daily while on warfarin Continue to monitor H&H and platelets  Thank you for allowing pharmacy to be a part of this patient's care.  Thelma Barge,  PharmD Clinical Pharmacist

## 2023-02-21 NOTE — Progress Notes (Signed)
X-ray attempted to obtain x-ray of patient's right foot per MD order. Patient not available at this time, patient out of room in therapy. Per X-ray tech will return later.       Tilden Dome, LPN

## 2023-02-21 NOTE — Plan of Care (Signed)
  Problem: Consults Goal: RH GENERAL PATIENT EDUCATION Description: See Patient Education module for education specifics. Outcome: Progressing   Problem: RH BOWEL ELIMINATION Goal: RH STG MANAGE BOWEL WITH ASSISTANCE Description: STG Manage Bowel with min Assistance. Outcome: Progressing Goal: RH STG MANAGE BOWEL W/MEDICATION W/ASSISTANCE Description: STG Manage Bowel with Medication with min Assistance. Outcome: Progressing   Problem: RH SKIN INTEGRITY Goal: RH STG SKIN FREE OF INFECTION/BREAKDOWN Description: Skin will remain intact and be free of infection/breakdown with min assist  Outcome: Progressing   Problem: RH SAFETY Goal: RH STG ADHERE TO SAFETY PRECAUTIONS W/ASSISTANCE/DEVICE Description: STG Adhere to Safety Precautions With cueing Assistance/Device. Outcome: Progressing Goal: RH STG DECREASED RISK OF FALL WITH ASSISTANCE Description: STG Decreased Risk of Fall With min Assistance. Outcome: Progressing   Problem: RH PAIN MANAGEMENT Goal: RH STG PAIN MANAGED AT OR BELOW PT'S PAIN GOAL Description: Pain will be managed at 4 out of 10 on pain scale with PRN medications min assist  Outcome: Progressing   Problem: RH KNOWLEDGE DEFICIT GENERAL Goal: RH STG INCREASE KNOWLEDGE OF SELF CARE AFTER HOSPITALIZATION Description: Patient/caregiver will be able to manage medications, self care, and diet/lifestyle modifications to improve cholesterol from nursing education and nursing handouts independently  Outcome: Progressing

## 2023-02-21 NOTE — Progress Notes (Signed)
PROGRESS NOTE   Subjective/Complaints:  Pt doing alright this morning, c/o R foot pain in the arch and into the great toe, some bruising noted, pt unsure of what happened to her foot. Pain meds don't really help much but she says she "does what she can" because of her seizure meds, and manages it ok.  Slept alright.  LBM this morning. Urinating fine today. Denies any other complaints or concerns today.   ROS:  +Urinary incontinence + Pain - back, HA, neck, R foot  Denies fevers, chills, N/V, abdominal pain, constipation, diarrhea, SOB, cough, chest pain, new weakness or paraesthesias.    Objective:   No results found. Recent Labs    02/19/23 0612 02/20/23 0610  WBC 4.5 4.7  HGB 10.0* 10.3*  HCT 30.5* 32.3*  PLT 201 250   Recent Labs    02/19/23 0612 02/20/23 0610  NA 137 137  K 3.6 3.7  CL 106 110  CO2 22 19*  GLUCOSE 100* 97  BUN 8 8  CREATININE 0.68 0.67  CALCIUM 8.5* 8.6*    Intake/Output Summary (Last 24 hours) at 02/21/2023 1352 Last data filed at 02/21/2023 0803 Gross per 24 hour  Intake 238 ml  Output --  Net 238 ml        Physical Exam: Vital Signs Blood pressure 118/66, pulse 71, temperature 98.1 F (36.7 C), temperature source Oral, resp. rate 18, height 5' 7.75" (1.721 m), weight 60.6 kg, SpO2 99%.   PE: Constitution: Appropriate appearance for age. No apparent distress. Sitting up in bed.  HEENT: Atraumatic, normocephalic. Resp: No respiratory distress. No accessory muscle usage. on RA and CTAB Cardio: Well perfused appearance. No peripheral edema. RRR.  Abdomen: Nondistended. Nontender.  +BS throughout.  Psych: Appropriate mood and affect.  Neuro: AAOx4. No apparent cognitive deficits. A little bit of head tremulousness at times.  MsK: R foot with bruising from arch up to plantar aspect of great toe area, moderately TTP along the arch and 1st metatarsal, no crepitus or deformity.  Distal pulses intact.   PRIOR EXAMS: Neurologic Exam:   DTR: WNL Babinsky: flexor responses b/l.   Hoffmans: negative b/l Sensory exam: revealed normal sensation in all dermatomal regions in bilateral upper extremities and bilateral lower extremities Motor exam: strength 5-/5 throughout bilateral upper extremities and bilateral lower extremities  Coordination: Fine motor coordination was normal.        Assessment/Plan: 1. Functional deficits which require 3+ hours per day of interdisciplinary therapy in a comprehensive inpatient rehab setting. Physiatrist is providing close team supervision and 24 hour management of active medical problems listed below. Physiatrist and rehab team continue to assess barriers to discharge/monitor patient progress toward functional and medical goals  Care Tool:  Bathing    Body parts bathed by patient: Right arm, Left arm, Chest, Abdomen, Front perineal area, Buttocks, Left lower leg, Right lower leg, Right upper leg, Left upper leg, Face         Bathing assist Assist Level: Minimal Assistance - Patient > 75%     Upper Body Dressing/Undressing Upper body dressing   What is the patient wearing?: Pull over shirt    Upper body assist Assist  Level: Minimal Assistance - Patient > 75%    Lower Body Dressing/Undressing Lower body dressing      What is the patient wearing?: Underwear/pull up, Pants     Lower body assist Assist for lower body dressing: Minimal Assistance - Patient > 75%     Toileting Toileting    Toileting assist Assist for toileting: Minimal Assistance - Patient > 75%     Transfers Chair/bed transfer  Transfers assist     Chair/bed transfer assist level: Minimal Assistance - Patient > 75%     Locomotion Ambulation   Ambulation assist      Assist level: Minimal Assistance - Patient > 75% Assistive device: Walker-rolling Max distance: 155   Walk 10 feet activity   Assist     Assist level: Minimal  Assistance - Patient > 75% Assistive device: Walker-rolling   Walk 50 feet activity   Assist    Assist level: Minimal Assistance - Patient > 75% Assistive device: Walker-rolling    Walk 150 feet activity   Assist    Assist level: Minimal Assistance - Patient > 75% Assistive device: Walker-rolling    Walk 10 feet on uneven surface  activity   Assist           Wheelchair     Assist Is the patient using a wheelchair?: Yes Type of Wheelchair: Manual    Wheelchair assist level: Minimal Assistance - Patient > 75% Max wheelchair distance: 150    Wheelchair 50 feet with 2 turns activity    Assist        Assist Level: Minimal Assistance - Patient > 75%   Wheelchair 150 feet activity     Assist      Assist Level: Minimal Assistance - Patient > 75%   Blood pressure 118/66, pulse 71, temperature 98.1 F (36.7 C), temperature source Oral, resp. rate 18, height 5' 7.75" (1.721 m), weight 60.6 kg, SpO2 99%.  Medical Problem List and Plan: 1. Functional deficits secondary to seizures- ran out meds due to insurance issues with more cognitive deficits and increased weakness above baseline             -patient may  shower             -ELOS/Goals: 2-2.5 weeks- min A to supervision             Admit to CIR   2.  Antithrombotics: -DVT/anticoagulation:  Pharmaceutical: Cannot have Eliquis or Xarelto due to interaction with Phenytoin- will change to tx dose lovenox 60mg  q12h and transition to Coumadin.              -antiplatelet therapy: none   3. Pain Management: Tylenol as needed (no tramadol)             -Lidoderm as needed   4. Mood/Behavior/Sleep: LCSW to evaluate and provide emotional support (no trazodone)             -antipsychotic agents: n/a  -02/22/23 slept alright, monitor   5. Neuropsych/cognition: This patient is capable of making decisions on her own behalf.   6. Skin/Wound Care: Routine skin care checks    - No apparent lesions in mouth,  tongue s/p ?seizure  7. Fluids/Electrolytes/Nutrition: Routine Is and Os and follow-up chemistries             -hypoalbuminemia/hypoproteinemia>>start Juven             -suboptimal B12 level (<400) continue vitamin B12 daily    - Admission labs  stable  8: Paroxysmal atrial fib: Eliquis started 7/18; prior IVC filter placement             -continue diltiazem 30 mg BID  -02/22/23 HR well controlled  Vitals:   02/20/23 1944 02/21/23 0445 02/21/23 0821 02/21/23 1342  BP: (!) 121/58 (!) 101/45 (!) 108/51 118/66  Pulse: 81 73 78 71  Resp: 16 16  18   Temp: 98.8 F (37.1 C) 98.9 F (37.2 C)  98.1 F (36.7 C)  TempSrc:  Oral  Oral  SpO2: 100% 98%  99%  Weight:      Height:          9: RUE DVT (right axillary vein, due to PICC): Eliquis started 7/18- stopped due to phenytoin- starting lovenox and transition to Coumadin per pharmacy   10: 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 Discussed with Dr. 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 CVA tenderness/flank pain-= CT done- was negative for any issues  17. Constipation- will order Sorbitol 30cc    - Large BM 7/19  -02/22/23 LBM today, monitor  18.  R foot pain and bruising: noted 02/22/23, xray pending  LOS: 2 days A FACE TO FACE EVALUATION WAS PERFORMED  4 [name] 02/21/2023, 1:52 PM

## 2023-02-21 NOTE — Progress Notes (Signed)
Occupational Therapy Session Note  Patient Details  Name: [name] MRN: [id number] Date of Birth: Jun 25, 1956  Today's Date: 02/21/2023 OT Individual Time: 0846-1000 OT Individual Time Calculation (min): 74 min    Short Term Goals: Week 1:  OT Short Term Goal 1 (Week 1): LTG=STG 2/2 ELOS  Skilled Therapeutic Interventions/Progress Updates:    Pt greeted semi-reclined in bed and agreeable to OT treatment session. Pt wanted to shower. OT gave pt her bag of clothing and she organized them seated EOB and picked out her outfit for today. Pt reported her R foot continues to bother her. Bruising noted along foot. Pt thinks she may have fallen when she had her seizures. Pt stood w/ RW and ambulated to toilet with CGA. Pt voided bladder and completed peri-care with supervision. Pt then ambulated to shower with CGA. Bathing completed sit<>stand from shower seat with overall CGA and increased time. Dressing tasks from wc with CGA for balance and cues for anterior weight shift in standing. OT assisted with getting glue out of hair while pt also assisted with brushing out tangles. Intermittent rest breaks due to UE fatigue. Pt left seated in wc at end of session with alarm belt on, call bell in reach, and needs met.   Therapy Documentation Precautions:  Precautions Precautions: Fall Precaution Comments: seizure Restrictions Weight Bearing Restrictions: No  Pain:  R foot pain, rest and repositioned for comfort   Therapy/Group: Individual Therapy  [name] 02/21/2023, 9:49 AM

## 2023-02-22 DIAGNOSIS — M79671 Pain in right foot: Secondary | ICD-10-CM

## 2023-02-22 LAB — PROTIME-INR
INR: 1.5 — ABNORMAL HIGH (ref 0.8–1.2)
Prothrombin Time: 18.3 seconds — ABNORMAL HIGH (ref 11.4–15.2)

## 2023-02-22 MED ORDER — WARFARIN SODIUM 7.5 MG PO TABS
7.5000 mg | ORAL_TABLET | Freq: Once | ORAL | Status: AC
  Administered 2023-02-22: 7.5 mg via ORAL
  Filled 2023-02-22: qty 1

## 2023-02-22 MED ORDER — LACOSAMIDE 200 MG PO TABS
ORAL_TABLET | ORAL | Status: AC
  Filled 2023-02-22: qty 1

## 2023-02-22 MED ORDER — LACOSAMIDE 200 MG PO TABS
ORAL_TABLET | ORAL | Status: AC
  Administered 2023-02-22: 200 mg via ORAL
  Filled 2023-02-22: qty 1

## 2023-02-22 NOTE — Progress Notes (Signed)
PROGRESS NOTE   Subjective/Complaints:  Pt doing alright, pain is tolerable today, has a headache but received meds and feels a bit better. Doesn't want anything else for pain. R foot still hurting but managable. Only wants to take tylenol.  Slept alright.  LBM yesterday. Urinating fine today. Denies any other complaints or concerns today.   ROS:  +Urinary incontinence + Pain - back, HA, neck, R foot  Denies fevers, chills, N/V, abdominal pain, constipation, diarrhea, SOB, cough, chest pain, new weakness or paraesthesias.    Objective:   DG Foot 2 Views Right  Result Date: 02/21/2023 CLINICAL DATA:  Seizure EXAM: RIGHT FOOT - 2 VIEW COMPARISON:  None Available. FINDINGS: There is no evidence of fracture or dislocation. There is no evidence of arthropathy or other focal bone abnormality. Soft tissues are unremarkable. IMPRESSION: No fracture or dislocation of the right foot. Joint spaces preserved. Electronically Signed   By: Jearld Lesch M.D.   On: 02/21/2023 16:35   Recent Labs    02/20/23 0610  WBC 4.7  HGB 10.3*  HCT 32.3*  PLT 250   Recent Labs    02/20/23 0610  NA 137  K 3.7  CL 110  CO2 19*  GLUCOSE 97  BUN 8  CREATININE 0.67  CALCIUM 8.6*   No intake or output data in the 24 hours ending 02/22/23 1042       Physical Exam: Vital Signs Blood pressure 119/62, pulse 63, temperature 98.3 F (36.8 C), resp. rate 14, height 5' 7.75" (1.721 m), weight 60.6 kg, SpO2 97%.   PE: Constitution: Appropriate appearance for age. No apparent distress. Laying in bed  HEENT: Atraumatic, normocephalic. Resp: No respiratory distress. No accessory muscle usage. on RA and CTAB Cardio: Well perfused appearance. No peripheral edema. RRR.  Abdomen: Nondistended. Nontender.  +BS throughout.  Psych: slightly anxious at times, but otherwise appropriate.  Neuro: AAOx4. No apparent cognitive deficits. A little bit of head  tremulousness at times, less noticeable today.  MsK: R foot with bruising from arch up to plantar aspect of great toe area, moderately TTP along the arch and 1st metatarsal, no crepitus or deformity. Distal pulses intact.   PRIOR EXAMS: Neurologic Exam:   DTR: WNL Babinsky: flexor responses b/l.   Hoffmans: negative b/l Sensory exam: revealed normal sensation in all dermatomal regions in bilateral upper extremities and bilateral lower extremities Motor exam: strength 5-/5 throughout bilateral upper extremities and bilateral lower extremities  Coordination: Fine motor coordination was normal.        Assessment/Plan: 1. Functional deficits which require 3+ hours per day of interdisciplinary therapy in a comprehensive inpatient rehab setting. Physiatrist is providing close team supervision and 24 hour management of active medical problems listed below. Physiatrist and rehab team continue to assess barriers to discharge/monitor patient progress toward functional and medical goals  Care Tool:  Bathing    Body parts bathed by patient: Right arm, Left arm, Chest, Abdomen, Front perineal area, Buttocks, Left lower leg, Right lower leg, Right upper leg, Left upper leg, Face         Bathing assist Assist Level: Minimal Assistance - Patient > 75%     Upper  Body Dressing/Undressing Upper body dressing   What is the patient wearing?: Pull over shirt    Upper body assist Assist Level: Minimal Assistance - Patient > 75%    Lower Body Dressing/Undressing Lower body dressing      What is the patient wearing?: Underwear/pull up, Pants     Lower body assist Assist for lower body dressing: Minimal Assistance - Patient > 75%     Toileting Toileting    Toileting assist Assist for toileting: Minimal Assistance - Patient > 75%     Transfers Chair/bed transfer  Transfers assist     Chair/bed transfer assist level: Minimal Assistance - Patient > 75%      Locomotion Ambulation   Ambulation assist      Assist level: Minimal Assistance - Patient > 75% Assistive device: Walker-rolling Max distance: 155   Walk 10 feet activity   Assist     Assist level: Minimal Assistance - Patient > 75% Assistive device: Walker-rolling   Walk 50 feet activity   Assist    Assist level: Minimal Assistance - Patient > 75% Assistive device: Walker-rolling    Walk 150 feet activity   Assist    Assist level: Minimal Assistance - Patient > 75% Assistive device: Walker-rolling    Walk 10 feet on uneven surface  activity   Assist           Wheelchair     Assist Is the patient using a wheelchair?: Yes Type of Wheelchair: Manual    Wheelchair assist level: Minimal Assistance - Patient > 75% Max wheelchair distance: 150    Wheelchair 50 feet with 2 turns activity    Assist        Assist Level: Minimal Assistance - Patient > 75%   Wheelchair 150 feet activity     Assist      Assist Level: Minimal Assistance - Patient > 75%   Blood pressure 119/62, pulse 63, temperature 98.3 F (36.8 C), resp. rate 14, height 5' 7.75" (1.721 m), weight 60.6 kg, SpO2 97%.  Medical Problem List and Plan: 1. Functional deficits secondary to seizures- ran out meds due to insurance issues with more cognitive deficits and increased weakness above baseline             -patient may  shower             -ELOS/Goals: 2-2.5 weeks- min A to supervision             Admit to CIR   2.  Antithrombotics: -DVT/anticoagulation:  Pharmaceutical: Cannot have Eliquis or Xarelto due to interaction with Phenytoin- will change to tx dose lovenox 60mg  q12h and transition to Coumadin.              -antiplatelet therapy: none   3. Pain Management: Tylenol as needed (no tramadol)             -Lidoderm as needed   4. Mood/Behavior/Sleep: LCSW to evaluate and provide emotional support (no trazodone)             -antipsychotic agents:  n/a  -7/20-21/24 slept alright, monitor   5. Neuropsych/cognition: This patient is capable of making decisions on her own behalf.   6. Skin/Wound Care: Routine skin care checks    - No apparent lesions in mouth, tongue s/p ?seizure  7. Fluids/Electrolytes/Nutrition: Routine Is and Os and follow-up chemistries             -hypoalbuminemia/hypoproteinemia>>start Juven             -  suboptimal B12 level (<400) continue vitamin B12 daily    - Admission labs stable  8: Paroxysmal atrial fib: Eliquis started 7/18; prior IVC filter placement             -continue diltiazem 30 mg BID  -7/20-21/24 HR well controlled  Vitals:   02/21/23 1939 02/22/23 0503 02/22/23 0811 02/22/23 0815  BP: 113/61 (!) 105/59 119/62 119/62  Pulse: 80 (!) 58  63  Resp:  14    Temp:  98.3 F (36.8 C)    TempSrc:      SpO2:  96%  97%  Weight:      Height:          9: RUE DVT (right axillary vein, due to PICC): Eliquis started 7/18- stopped due to phenytoin- starting lovenox and transition to Coumadin per pharmacy   10: 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 Discussed with Dr. Melynda Ripple; increase Topomax to 50 mg/100 mg  -02/22/23 phenytoin level low at 2.7 on 02/20/23, Dr. Shearon Stalls spoke with Dr. 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 CVA tenderness/flank pain-= CT done- was negative for any issues  17. Constipation- will order Sorbitol 30cc    - Large BM 7/19  -02/22/23 LBM yesterday, monitor  18. R foot pain and bruising: noted 02/22/23, xray shows no fx or acute findings, suspect contusion, advised ice to area and monitor.   LOS: 3 days A FACE TO FACE EVALUATION WAS PERFORMED  [address] 02/22/2023, 10:42 AM

## 2023-02-22 NOTE — IPOC Note (Signed)
Overall Plan of Care [facility]) Patient Details Name: [name] MRN: [id number] DOB: [birth year]  Admitting Diagnosis: Seizure disorder [facility])  Hospital Problems: Principal Problem:   Seizure disorder [facility]) Active Problems:   Debility   Right foot pain     Functional Problem List: Nursing Safety, Bowel, Endurance, Medication Management, Pain  PT Balance, Motor, Safety, Pain, Endurance  OT Balance, Endurance, Cognition, Safety  SLP    TR         Basic ADL's: OT Toileting, Dressing, Bathing, Grooming, Eating     Advanced  ADL's: OT       Transfers: PT Bed Mobility, Bed to Chair, Car, [employer], Floor  OT Toilet, Research scientist (life sciences): PT Ambulation, Stairs     Additional Impairments: OT Fuctional Use of Upper Extremity  SLP        TR      Anticipated Outcomes Item Anticipated Outcome  Self Feeding Mod I  Swallowing      Basic self-care  Mod I/supervision  Engineer, technical sales Transfers Supervision  Bowel/Bladder  continent B/B  Transfers  Independent  Locomotion  Mod I w/ LRAD  Communication     Cognition     Pain  less than 4  Safety/Judgment  no falls/seizures   Therapy Plan: PT Intensity: Minimum of 1-2 x/day ,45 to 90 minutes PT Frequency: 5 out of 7 days PT Duration Estimated Length of Stay: 10-12 days OT Intensity: Minimum of 1-2 x/day, 45 to 90 minutes OT Frequency: 5 out of 7 days OT Duration/Estimated Length of Stay: 7-10     Team Interventions: Nursing Interventions Patient/Family Education, Medication Management, Bowel Management, Disease Management/Prevention, Pain Management, Discharge Planning  PT interventions Ambulation/gait training, Cognitive remediation/compensation, Discharge planning, Functional mobility training, Pain management, Therapeutic Activities, UE/LE Strength taining/ROM, Warden/ranger, Community reintegration, Neuromuscular re-education, Equities trader education, Museum/gallery curator,  Therapeutic Exercise, UE/LE Coordination activities  OT Interventions Warden/ranger, Cognitive remediation/compensation, Firefighter, Discharge planning, Disease mangement/prevention, Fish farm manager, Functional electrical stimulation, Functional mobility training, Neuromuscular re-education, Pain management, Patient/family education, Psychosocial support, Self Care/advanced ADL retraining, Skin care/wound managment, Splinting/orthotics, Therapeutic Activities, Therapeutic Exercise, UE/LE Strength taining/ROM, UE/LE Coordination activities, Visual/perceptual remediation/compensation, Wheelchair propulsion/positioning  SLP Interventions    TR Interventions    SW/CM Interventions Discharge Planning, Patient/Family Education, Psychosocial Support   Barriers to Discharge MD  Medical stability, Home enviroment access/loayout, Lack of/limited family support, Insurance for SNF coverage, and Medication compliance  Nursing Decreased caregiver support lives with daughter in 2 level home B/B upstairs uses chair lift with level entry  PT Home environment access/layout, Decreased caregiver support STE  OT      SLP      SW Decreased caregiver support, Lack of/limited family support, Community education officer for SNF coverage     Team Discharge Planning: Destination: PT-Home ,OT- Home , SLP-  Projected Follow-up: PT-Home health PT, OT-  Outpatient OT, SLP-  Projected Equipment Needs: PT-To be determined, OT- To be determined, SLP-  Equipment Details: PT- , OT-TBD Patient/family involved in discharge planning: PT- Patient,  OT-Patient, SLP-   MD ELOS: 14-21 days Medical Rehab Prognosis:  Good Assessment: The patient has been admitted for CIR therapies with the diagnosis of seizures. The team will be addressing functional mobility, strength, stamina, balance, safety, adaptive techniques and equipment, self-care, bowel and bladder mgt, patient and caregiver education. Goals  have been set at Supervision. Anticipated discharge destination is home.       See Team Conference Notes for  weekly updates to the plan of care

## 2023-02-22 NOTE — Progress Notes (Signed)
Pt was Aox4, was complaining of a headache. Pt stated headache started upon waking and was a 3/10 but now feels like a 8/10. Administered PRN tylenol and reassessed pain score after 30 mins, pain was reported at 7/10, pt said that there was only slight improvement. Assessed R foot pain, pt stated that pain was still at an 8//10. Heart sounds were normal with no arrhythmias or dysrhythmias noted. RUE/LUE radial pulses +3. Lung sounds were clear and equal bilateral. Skin was clean,dry, and intact. Cap refill within 3 sec and normal skin color. Bowel sounds were heard in all quadrants within 5-34 sec.   I agree with this student's documentation.

## 2023-02-22 NOTE — Progress Notes (Signed)
ANTICOAGULATION CONSULT NOTE  Pharmacy Consult: Lovenox / Warfarin  Indication: DVT  Allergies  Allergen Reactions   Zonegran [Zonisamide] Other (See Comments)    Numbness and tingling over whole body   Chocolate Other (See Comments)    Triggers seizures   Codeine Nausea And Vomiting   Keppra [Levetiracetam] Other (See Comments)    Causes seizures   Lyrica [Pregabalin] Nausea And Vomiting   Olive Oil Rash    Patient Measurements: Height: 5' 7.75" (172.1 cm) Weight: 60.6 kg (133 lb 9.6 oz) IBW/kg (Calculated) : 63.33  Vital Signs: Temp: 98.3 F (36.8 C) (07/21 0503) BP: 119/62 (07/21 0815) Pulse Rate: 63 (07/21 0815)  Labs: Recent Labs    02/20/23 0610 02/21/23 0528 02/22/23 0656  HGB 10.3*  --   --   HCT 32.3*  --   --   PLT 250  --   --   LABPROT 14.3 15.2 18.3*  INR 1.1 1.2 1.5*  CREATININE 0.67  --   --     Estimated Creatinine Clearance: 68 mL/min (by C-G formula based on SCr of 0.67 mg/dL).   Assessment: 68 year old female with a history of AVM rupture in 1989 s/p craniectomy and subsequent epilepsy since that time, history of prior DVT 2022 and afib/aflutter not on anticoagulation PTA due to high risk for bleeding complications (falls, hx ICH) who presented on 7/8 to [facility] with breakthrough seizures now with concerns for new DVT.  Now on Warfarin wit enoxaparin bridge. CBC stable, platelets 250. No issues with bleeding reported. INR remains subtherapeutic but trending up now at 1.5. Increase warfarin dose\, continue enoxaparin bridge.  Goal of Therapy:  INR 2 to 3 Monitor platelets by anticoagulation protocol: Yes   Plan:   Lovenox 60mg  SQ q12h until INR =/>2 Give warfarin 7.5 mg PO x1 dose Check INR daily while on warfarin Continue to monitor H&H and platelets  Thank you for allowing pharmacy to be a part of this patient's care.  Thelma Barge, PharmD Clinical Pharmacist

## 2023-02-22 NOTE — Progress Notes (Cosign Needed)
Pt was Aox4, was complaining of a headache. Pt stated headache started upon waking and was a 3/10 but now feels like a 8/10. Administered PRN tylenol and reassessed pain score after 30 mins, pain was reported at 7/10, pt said that there was only slight improvement. Assessed R foot pain, pt stated that pain was still at an 8//10. Heart sounds were normal with no arrhythmias or dysrhythmias noted. RUE/LUE radial pulses +3. Lung sounds were clear and equal bilateral. Skin was clean,dry, and intact. Cap refill within 3 sec and normal skin color. Bowel sounds were heard in all quadrants within 5-34 sec.

## 2023-02-23 DIAGNOSIS — G40909 Epilepsy, unspecified, not intractable, without status epilepticus: Secondary | ICD-10-CM | POA: Diagnosis not present

## 2023-02-23 DIAGNOSIS — K5901 Slow transit constipation: Secondary | ICD-10-CM | POA: Diagnosis not present

## 2023-02-23 DIAGNOSIS — M79671 Pain in right foot: Secondary | ICD-10-CM | POA: Diagnosis not present

## 2023-02-23 LAB — CBC
HCT: 35 % — ABNORMAL LOW (ref 36.0–46.0)
Hemoglobin: 11.2 g/dL — ABNORMAL LOW (ref 12.0–15.0)
MCH: 32.6 pg (ref 26.0–34.0)
MCHC: 32 g/dL (ref 30.0–36.0)
MCV: 101.7 fL — ABNORMAL HIGH (ref 80.0–100.0)
Platelets: 359 10*3/uL (ref 150–400)
RBC: 3.44 MIL/uL — ABNORMAL LOW (ref 3.87–5.11)
RDW: 12.8 % (ref 11.5–15.5)
WBC: 5.1 10*3/uL (ref 4.0–10.5)
nRBC: 0 % (ref 0.0–0.2)

## 2023-02-23 LAB — BASIC METABOLIC PANEL WITH GFR
Anion gap: 7 (ref 5–15)
BUN: 9 mg/dL (ref 8–23)
CO2: 21 mmol/L — ABNORMAL LOW (ref 22–32)
Calcium: 8.8 mg/dL — ABNORMAL LOW (ref 8.9–10.3)
Chloride: 110 mmol/L (ref 98–111)
Creatinine, Ser: 0.78 mg/dL (ref 0.44–1.00)
GFR, Estimated: >60 mL/min
Glucose, Bld: 114 mg/dL — ABNORMAL HIGH (ref 70–99)
Potassium: 3.4 mmol/L — ABNORMAL LOW (ref 3.5–5.1)
Sodium: 138 mmol/L (ref 135–145)

## 2023-02-23 LAB — PROTIME-INR
INR: 1.7 — ABNORMAL HIGH (ref 0.8–1.2)
Prothrombin Time: 20.6 s — ABNORMAL HIGH (ref 11.4–15.2)

## 2023-02-23 MED ORDER — WARFARIN SODIUM 7.5 MG PO TABS
7.5000 mg | ORAL_TABLET | Freq: Once | ORAL | Status: AC
  Administered 2023-02-23: 7.5 mg via ORAL
  Filled 2023-02-23: qty 1

## 2023-02-23 MED ORDER — LACOSAMIDE 200 MG PO TABS
ORAL_TABLET | ORAL | Status: AC
  Filled 2023-02-23: qty 1

## 2023-02-23 MED ORDER — POTASSIUM CHLORIDE CRYS ER 20 MEQ PO TBCR
20.0000 meq | EXTENDED_RELEASE_TABLET | Freq: Every day | ORAL | Status: DC
  Administered 2023-02-23 – 2023-02-28 (×6): 20 meq via ORAL
  Filled 2023-02-23 (×6): qty 1

## 2023-02-23 NOTE — Progress Notes (Signed)
ANTICOAGULATION CONSULT NOTE  Pharmacy Consult: Lovenox / Warfarin  Indication: DVT  Allergies  Allergen Reactions   Zonegran [Zonisamide] Other (See Comments)    Numbness and tingling over whole body   Chocolate Other (See Comments)    Triggers seizures   Codeine Nausea And Vomiting   Keppra [Levetiracetam] Other (See Comments)    Causes seizures   Lyrica [Pregabalin] Nausea And Vomiting   Olive Oil Rash    Patient Measurements: Height: 5' 7.75" (172.1 cm) Weight: 60.6 kg (133 lb 9.6 oz) IBW/kg (Calculated) : 63.33  Vital Signs: Temp: 97.9 F (36.6 C) (07/22 0541) BP: 103/64 (07/22 0541) Pulse Rate: 59 (07/22 0541)  Labs: Recent Labs    02/21/23 0528 02/22/23 0656 02/23/23 0808  HGB  --   --  11.2*  HCT  --   --  35.0*  PLT  --   --  359  LABPROT 15.2 18.3* 20.6*  INR 1.2 1.5* 1.7*  CREATININE  --   --  0.78    Estimated Creatinine Clearance: 68 mL/min (by C-G formula based on SCr of 0.78 mg/dL).   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  Now on Warfarin wit enoxaparin bridge. CBC stable, platelets 250. No issues with bleeding reported. INR remains subtherapeutic but trending up now at 1.7. Increase warfarin dose, continue enoxaparin bridge.  Goal of Therapy:  INR 2 to 3 Monitor platelets by anticoagulation protocol: Yes   Plan:   Lovenox 60mg  SQ q12h until INR =/>2 Give warfarin 7.5 mg PO x1 dose Check INR daily while on warfarin Continue to monitor H&H and platelets  Thank you for allowing pharmacy to be a part of this patient's care.  Thelma Barge, PharmD Clinical Pharmacist

## 2023-02-23 NOTE — Care Management (Signed)
[facility] Individual Statement of Services  Patient Name:  [name]  Date:  02/23/2023  Welcome to the [facility]patient [facility].  Our goal is to provide you with an individualized program based on your diagnosis and situation, designed to meet your specific needs.  With this comprehensive rehabilitation program, you will be expected to participate in at least 3 hours of rehabilitation therapies Monday-Friday, with modified therapy programming on the weekends.  Your rehabilitation program will include the following services:  Physical Therapy (PT), Occupational Therapy (OT), 24 hour per day rehabilitation nursing, Therapeutic Recreaction (TR), Psychology, Neuropsychology, Care Coordinator, Rehabilitation Medicine, Nutrition Services, Pharmacy Services, and Other  Weekly team conferences will be held on Tuesdays to discuss your progress.  Your Inpatient Rehabilitation Care Coordinator will talk with you frequently to get your input and to update you on team discussions.  Team conferences with you and your family in attendance may also be held.  Expected length of stay: 10-12 days    Overall anticipated outcome: Supervision  Depending on your progress and recovery, your program may change. Your Inpatient Rehabilitation Care Coordinator will coordinate services and will keep you informed of any changes. Your Inpatient Rehabilitation Care Coordinator's name and contact numbers are listed  below.  The following services may also be recommended but are not provided by the [facility]:  Driving Evaluations Home Health Rehabiltiation Services Outpatient Rehabilitation Services Vocational Rehabilitation   Arrangements will be made to provide these services after discharge if needed.  Arrangements include referral to agencies that provide these services.  Your insurance has been verified to be:  [employer]  Your primary doctor is:  Jorge Ny  Pertinent information will be shared with your doctor and your insurance company.  Inpatient Rehabilitation Care Coordinator:  Susie Cassette [phone] or ([phone]  Information discussed with and copy given to patient by: Gretchen Short, 02/23/2023, 9:37 AM

## 2023-02-23 NOTE — Plan of Care (Signed)
  Problem: RH SKIN INTEGRITY Goal: RH STG SKIN FREE OF INFECTION/BREAKDOWN Description: Skin will remain intact and be free of infection/breakdown with min assist  Outcome: Progressing   Problem: RH SAFETY Goal: RH STG ADHERE TO SAFETY PRECAUTIONS W/ASSISTANCE/DEVICE Description: STG Adhere to Safety Precautions With cueing Assistance/Device. Outcome: Progressing   Problem: RH SAFETY Goal: RH STG DECREASED RISK OF FALL WITH ASSISTANCE Description: STG Decreased Risk of Fall With min Assistance. Outcome: Progressing

## 2023-02-23 NOTE — Progress Notes (Signed)
Physical Therapy Session Note  Patient Details  Name: [name] MRN: [id number] Date of Birth: November 13, 1955  Today's Date: 02/23/2023 PT Individual Time: 1445-1530 PT Individual Time Calculation (min): 45 min   Short Term Goals: Week 1:  PT Short Term Goal 1 (Week 1): Pt will transfer sit to stand w/ CGA. PT Short Term Goal 2 (Week 1): Pt will amb 150' w/ CGA and LRAD. PT Short Term Goal 3 (Week 1): Pt will negotiate 12 steps w/ 1 rail and CGA.  Skilled Therapeutic Interventions/Progress Updates:    pt received in bed and agreeable to therapy. Pt reports continued unrated pain in her R foot with noted bruising, no interventions required at this time. Pt donned shoes with set up and supervision for balance.  Session focused on gait training with RW for safety during turns and recall of single and multi step instructions.   Pt performed obstacles including weaving through cones, stepping over hurdles, and navigating airex pad with CGA overall. Pt required visual demonstration with RW before correctly navigating all obstacles with CGA and verbal cueing. Progressed to multi step task (weaving through cones then carrying cone across gym) for improved safety with turns. Cues for staying inside RW and using RW to offload R foot.   Pt returned to room after session and to bed, was left with all needs in reach and alarm active.   Therapy Documentation Precautions:  Precautions Precautions: Fall Precaution Comments: seizure Restrictions Weight Bearing Restrictions: No General:       Therapy/Group: Individual Therapy  Juluis Rainier 02/23/2023, 3:39 PM

## 2023-02-23 NOTE — Progress Notes (Signed)
Physical Therapy Session Note  Patient Details  Name: [name] MRN: [id number] Date of Birth: 04/14/1956  Today's Date: 02/23/2023 PT Individual Time: 0903-0959 PT Individual Time Calculation (min): 56 min   Short Term Goals: Week 1:  PT Short Term Goal 1 (Week 1): Pt will transfer sit to stand w/ CGA. PT Short Term Goal 2 (Week 1): Pt will amb 150' w/ CGA and LRAD. PT Short Term Goal 3 (Week 1): Pt will negotiate 12 steps w/ 1 rail and CGA.  Skilled Therapeutic Interventions/Progress Updates:     Pt received supine in bed and agrees to therapy. Reports pain in Rt foot, though acknowledges that x-ray is negative for fracture. PT provides rest breaks and education to manage pain. Pt performs bed mobility with bed features. Pt performs stand step transfer to [facility] with CGA/minA due to increased postural sway and poor sequencing of transfer. WC transport to gym. Pt ambulates x350' without AD, requiring CGA/minA, but with no overt LOBs, improving performance from previous session with this therapist. PT provides cues for upright ggaze to improve posture and balance, and cues to facilitate symmetrical reciprocal gait pattern, as pt tends to ambulate with staggered gait pattern and LLE externally rotated. Pt states she ambulates like this to offload painful Rt foot.   Pt performs standing balance activity with mirror for visual feedback. Pt completes x10 bilateral trunk rotations holding onto 1kg ball with outstretched arms in front of body to engage core and increase balance demand. Pt then completes x10 trunk rotations reaching from upper Rt quadrant to lower Lt, then from upper Lt quadrant to lower Rt.   Pt performs x5 consecutive reps of sit to stand with CGA. Performed for functional strengthening and balance training. PT provides cues for increased eccentric control of stand to sit. Pt then completes x5 using BUEs to push off thighs for increased challenge.   Pt performs toe taps on 3" step  for balance challenge. Pt has difficulty sequencing task, and also loses balance multiple times, requiring minA to modA for safety. PT provides pt with mirror for visual feedback and provides cues for posture and weight shifting and pt improves slightly, requiring minA overall.   WC transport back to room. Left seated with alarm intact and all needs within reach.   Therapy Documentation Precautions:  Precautions Precautions: Fall Precaution Comments: seizure Restrictions Weight Bearing Restrictions: No   Therapy/Group: Individual Therapy  Beau Fanny, PT, DPT 02/23/2023, 5:08 PM

## 2023-02-23 NOTE — Progress Notes (Signed)
Occupational Therapy Session Note  Patient Details  Name: [name] MRN: [id number] Date of Birth: 02/12/1956  Today's Date: 02/23/2023 OT Individual Time: 4098-1191 OT Individual Time Calculation (min): 71 min    Short Term Goals: Week 1:  OT Short Term Goal 1 (Week 1): LTG=STG 2/2 ELOS  Skilled Therapeutic Interventions/Progress Updates:   Pt seen for skilled OT session with focus on functional safety, sequencing and balance for am self care routine. Amb with RW CGA fading to close S with min cues for shower, toilet and sink side access, Close S shower for UB and CGA for LB bathing with grab bar support, ADL set up with increased time and CGA for reaching (could use RW bag), CGA for LB dressing and set up UB dressing and grooming. OT provided facilitation during session for overall processing  with min cues primarily with occasional repetition for 2 step commands. Spoke with MSW who arrived to discuss initially information re: LOS and needs with pt. Pt will benefit from caregiver training and DME needs for d/c later week or early next. Pt repeatedly conveyed "I just want my seizures under control". Educated on physical influences on seizures including fatigue, stress, dehydration, certain food triggers with pt able to verbalize/identify with min cues. Left pt w/c level with chair alarm set, needs and nurse call button in reach.   Pain: "My R foot still bothers me some", shower, rest and repositioning for relief with bruising noted on bottom of R foot.   Therapy Documentation Precautions:  Precautions Precautions: Fall Precaution Comments: seizure Restrictions Weight Bearing Restrictions: No    Therapy/Group: Individual Therapy  Vicenta Dunning 02/23/2023, 7:47 AM

## 2023-02-23 NOTE — Progress Notes (Signed)
Physical Therapy Session Note  Patient Details  Name: [name] MRN: [id number] Date of Birth: 11-29-1955  Today's Date: 02/23/2023 PT Individual Time: 1300-1325 PT Individual Time Calculation (min): 25 min   Short Term Goals: Week 1:  PT Short Term Goal 1 (Week 1): Pt will transfer sit to stand w/ CGA. PT Short Term Goal 2 (Week 1): Pt will amb 150' w/ CGA and LRAD. PT Short Term Goal 3 (Week 1): Pt will negotiate 12 steps w/ 1 rail and CGA.  Skilled Therapeutic Interventions/Progress Updates:    Chart reviewed and pt agreeable to therapy. Pt received seated in bed with c/o pain in R foot that was not quantified. Session focused on amb quality and stair navigation to promote safe home access. Pt initiated session with amb of 240ft to therapy gym using CGA + RW. Pt then completed blocked practice of step up/down with CGA + B rails. Pt noted to have challenge with foot placement that reduced slightly with focused VC Pt then amb 111ft + 134ft with CGA + RW + VC for safe use of DME. At end of session, pt was left seated in bed with alarm engaged, nurse call bell and all needs in reach.     Therapy Documentation Precautions:  Precautions Precautions: Fall Precaution Comments: seizure Restrictions Weight Bearing Restrictions: No     Therapy/Group: Individual Therapy  Dionne Milo, PT, DPT 02/23/2023, 2:53 PM

## 2023-02-23 NOTE — Progress Notes (Signed)
PROGRESS NOTE   Subjective/Complaints:  Generally doing well. C/o back pain and right foot pain. Showed me bruising on right foot.   ROS: Patient denies fever, rash, sore throat, blurred vision, dizziness, nausea, vomiting, diarrhea, cough, shortness of breath or chest pain,  headache, or mood change.     Objective:   DG Foot 2 Views Right  Result Date: 02/21/2023 CLINICAL DATA:  Seizure EXAM: RIGHT FOOT - 2 VIEW COMPARISON:  None Available. FINDINGS: There is no evidence of fracture or dislocation. There is no evidence of arthropathy or other focal bone abnormality. Soft tissues are unremarkable. IMPRESSION: No fracture or dislocation of the right foot. Joint spaces preserved. Electronically Signed   By: Jearld Lesch M.D.   On: 02/21/2023 16:35   Recent Labs    02/23/23 0808  WBC 5.1  HGB 11.2*  HCT 35.0*  PLT 359   Recent Labs    02/23/23 0808  NA 138  K 3.4*  CL 110  CO2 21*  GLUCOSE 114*  BUN 9  CREATININE 0.78  CALCIUM 8.8*    Intake/Output Summary (Last 24 hours) at 02/23/2023 0920 Last data filed at 02/23/2023 0757 Gross per 24 hour  Intake 1014 ml  Output --  Net 1014 ml         Physical Exam: Vital Signs Blood pressure 103/64, pulse (!) 59, temperature 97.9 F (36.6 C), resp. rate 17, height 5' 7.75" (1.721 m), weight 60.6 kg, SpO2 96%.   Constitutional: No distress . Vital signs reviewed. HEENT: NCAT, EOMI, oral membranes moist Neck: supple Cardiovascular: RRR without murmur. No JVD    Respiratory/Chest: CTA Bilaterally without wheezes or rales. Normal effort    GI/Abdomen: BS +, non-tender, non-distended Ext: no clubbing, cyanosis, or edema Psych: pleasant and cooperative, sl anxious  Neuro: AAOx4. No apparent cognitive deficits.   MsK: R foot with bruising from arch up to plantar aspect of great toe area, remains TTP along the arch and 1st metatarsal, no crepitus or deformity.      DTR:  WNL Babinsky: flexor responses b/l.   Hoffmans: negative b/l Sensory exam: revealed normal sensation in all dermatomal regions in bilateral upper extremities and bilateral lower extremities Motor exam: strength 5- to 5/5 throughout bilateral upper extremities and bilateral lower extremities  Coordination: Fine motor coordination was normal.        Assessment/Plan: 1. Functional deficits which require 3+ hours per day of interdisciplinary therapy in a comprehensive inpatient rehab setting. Physiatrist is providing close team supervision and 24 hour management of active medical problems listed below. Physiatrist and rehab team continue to assess barriers to discharge/monitor patient progress toward functional and medical goals  Care Tool:  Bathing    Body parts bathed by patient: Right arm, Left arm, Chest, Abdomen, Front perineal area, Buttocks, Left lower leg, Right lower leg, Right upper leg, Left upper leg, Face         Bathing assist Assist Level: Minimal Assistance - Patient > 75%     Upper Body Dressing/Undressing Upper body dressing   What is the patient wearing?: Pull over shirt    Upper body assist Assist Level: Minimal Assistance - Patient > 75%  Lower Body Dressing/Undressing Lower body dressing      What is the patient wearing?: Underwear/pull up, Pants     Lower body assist Assist for lower body dressing: Minimal Assistance - Patient > 75%     Toileting Toileting    Toileting assist Assist for toileting: Minimal Assistance - Patient > 75%     Transfers Chair/bed transfer  Transfers assist     Chair/bed transfer assist level: Minimal Assistance - Patient > 75%     Locomotion Ambulation   Ambulation assist      Assist level: Minimal Assistance - Patient > 75% Assistive device: Walker-rolling Max distance: 155   Walk 10 feet activity   Assist     Assist level: Minimal Assistance - Patient > 75% Assistive device: Walker-rolling    Walk 50 feet activity   Assist    Assist level: Minimal Assistance - Patient > 75% Assistive device: Walker-rolling    Walk 150 feet activity   Assist    Assist level: Minimal Assistance - Patient > 75% Assistive device: Walker-rolling    Walk 10 feet on uneven surface  activity   Assist           Wheelchair     Assist Is the patient using a wheelchair?: Yes Type of Wheelchair: Manual    Wheelchair assist level: Minimal Assistance - Patient > 75% Max wheelchair distance: 150    Wheelchair 50 feet with 2 turns activity    Assist        Assist Level: Minimal Assistance - Patient > 75%   Wheelchair 150 feet activity     Assist      Assist Level: Minimal Assistance - Patient > 75%   Blood pressure 103/64, pulse (!) 59, temperature 97.9 F (36.6 C), resp. rate 17, height 5' 7.75" (1.721 m), weight 60.6 kg, SpO2 96%.  Medical Problem List and Plan: 1. Functional deficits secondary to seizures- ran out meds due to insurance issues with more cognitive deficits and increased weakness above baseline             -patient may  shower             -ELOS/Goals: 2-2.5 weeks- min A to supervision             -Continue CIR therapies including PT, OT    2.  Antithrombotics: -DVT/anticoagulation:  Pharmaceutical: Cannot have Eliquis or Xarelto due to interaction with Phenytoin- will change to tx dose lovenox 60mg  q12h and transition to Coumadin.              -antiplatelet therapy: none   3. Pain Management: Tylenol as needed (no tramadol)             -Lidoderm as needed   4. Mood/Behavior/Sleep: LCSW to evaluate and provide emotional support (no trazodone)             -antipsychotic agents: n/a  -7/20-22/24 sleeping fairly well, monitor   5. Neuropsych/cognition: This patient is capable of making decisions on her own behalf.   6. Skin/Wound Care: Routine skin care checks    - No apparent lesions in mouth, tongue s/p ?seizure  7.  Fluids/Electrolytes/Nutrition: Routine Is and Os and follow-up chemistries             -hypoalbuminemia/hypoproteinemia>>start Juven             -suboptimal B12 level (<400) continue vitamin B12 daily    7/22 potassium 3.4---supplement, other labs ok  8: Paroxysmal atrial  fib: Eliquis started 7/18; prior IVC filter placement             -continue diltiazem 30 mg BID  -7/20-21/24 HR well controlled  Vitals:   02/22/23 0815 02/22/23 1319 02/22/23 2038 02/23/23 0541  BP: 119/62 (!) 112/51 (!) 115/57 103/64  Pulse: 63 92 66 (!) 59  Resp:  16 18 17   Temp:  98.6 F (37 C) 99.2 F (37.3 C) 97.9 F (36.6 C)  TempSrc:  Oral    SpO2: 97% 97% 97% 96%  Weight:      Height:          9: RUE DVT (right axillary vein, due to PICC): Eliquis started 7/18- stopped due to phenytoin- starting lovenox and transition to Coumadin per pharmacy   10: 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 Discussed with Dr. Melynda Ripple; increase Topomax to 50 mg/100 mg  -02/23/23 phenytoin level low at 2.7 on 02/20/23, Dr. Shearon Stalls spoke with Dr. 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 CVA tenderness/flank pain-= CT done- was negative for any issues  17. Constipation- will order Sorbitol 30cc    - Large BM 7/19  -02/22/23 LBM yesterday, monitor  18. R foot pain and bruising: noted 02/22/23, xray shows no fx or acute findings, suspect contusion  -area appears unchanged compared to reports -continue ice to area and monitor.   -area still tender.  -encouraged appropriate shoe wear when out of bed  LOS: 4 days A FACE TO FACE EVALUATION WAS PERFORMED  Ranelle Oyster 02/23/2023, 9:20 AM

## 2023-02-23 NOTE — Progress Notes (Signed)
Patient ID: [name], female   DOB: 03-25-1956, 67 y.o.   MRN: [id number]  SW spoke with pt dtr [name] to inform on ELOS. SW will follow-up with updates after team conference.   Cecile Sheerer, MSW, LCSWA Office: [phone] Cell: [phone] Fax: [phone]

## 2023-02-24 ENCOUNTER — Encounter: Payer: Self-pay | Admitting: *Deleted

## 2023-02-24 DIAGNOSIS — I4891 Unspecified atrial fibrillation: Secondary | ICD-10-CM | POA: Diagnosis not present

## 2023-02-24 DIAGNOSIS — M79671 Pain in right foot: Secondary | ICD-10-CM | POA: Diagnosis not present

## 2023-02-24 DIAGNOSIS — G40909 Epilepsy, unspecified, not intractable, without status epilepticus: Secondary | ICD-10-CM | POA: Diagnosis not present

## 2023-02-24 DIAGNOSIS — Z9119 Patient's noncompliance with other medical treatment and regimen due to financial hardship: Secondary | ICD-10-CM | POA: Diagnosis not present

## 2023-02-24 LAB — PROTIME-INR
INR: 2.2 — ABNORMAL HIGH (ref 0.8–1.2)
Prothrombin Time: 24.7 seconds — ABNORMAL HIGH (ref 11.4–15.2)

## 2023-02-24 MED ORDER — LIDOCAINE 5 % EX PTCH
1.0000 | MEDICATED_PATCH | CUTANEOUS | Status: DC
  Administered 2023-02-24 – 2023-02-27 (×4): 1 via TRANSDERMAL
  Filled 2023-02-24 (×4): qty 1

## 2023-02-24 MED ORDER — WARFARIN SODIUM 5 MG PO TABS
5.0000 mg | ORAL_TABLET | Freq: Once | ORAL | Status: AC
  Administered 2023-02-24: 5 mg via ORAL
  Filled 2023-02-24: qty 1

## 2023-02-24 NOTE — Progress Notes (Signed)
PROGRESS NOTE   Subjective/Complaints:  No events overnight.  No recurrent seizure-like episodes.    Continues to complain of pain in the bottom of her right foot, extending from the heel into the bottoms of the toes and wrapping up towards the top of her toes.  She describes it as a stabbing, intermittent pain, worse with any kind of pressure.  She states this was going on prior to her hospitalization, but was worsened with a bruise on her plantar foot.  Has been somewhat improved with taping, but still really limits her function.  Bridged to coumadin today Hypotensive 98/51 this AM LBM 7/22  ROS:  + R foot pain + Headaches  Patient denies fever, rash, sore throat, blurred vision, dizziness, nausea, vomiting, diarrhea, cough, shortness of breath or chest pain,  headache, or mood change.     Objective:   No results found. Recent Labs    02/23/23 0808  WBC 5.1  HGB 11.2*  HCT 35.0*  PLT 359   Recent Labs    02/23/23 0808  NA 138  K 3.4*  CL 110  CO2 21*  GLUCOSE 114*  BUN 9  CREATININE 0.78  CALCIUM 8.8*    Intake/Output Summary (Last 24 hours) at 02/24/2023 0848 Last data filed at 02/24/2023 0837 Gross per 24 hour  Intake 540 ml  Output --  Net 540 ml         Physical Exam: Vital Signs Blood pressure (!) 98/51, pulse 61, temperature 98 F (36.7 C), resp. rate 17, height 5' 7.75" (1.721 m), weight 60.6 kg, SpO2 99%.   Constitutional: No distress . Vital signs reviewed.  Sitting upright in bed. HEENT: NCAT, EOMI, oral membranes moist Neck: supple Cardiovascular: RRR without murmur. No JVD    Respiratory/Chest: CTA Bilaterally without wheezes or rales. Normal effort    GI/Abdomen: BS +, non-tender, non-distended Ext: no clubbing, cyanosis, or edema Psych: pleasant and cooperative, sl anxious  Neuro: AAOx4. No apparent cognitive deficits.   MsK: R foot with bruising from arch up to plantar aspect  of great toe area, remains exquisitely TTP from the posterior calcaneus along the arch and 1st metatarsal, no crepitus or deformity.  Continues taped DTR: WNL Babinsky: flexor responses b/l.   Hoffmans: negative b/l Sensory exam: revealed normal sensation in all dermatomal regions in bilateral upper extremities and bilateral lower extremities Motor exam: strength 5- to 5/5 throughout bilateral upper extremities and bilateral lower extremities  Coordination: Fine motor coordination was normal.        Assessment/Plan: 1. Functional deficits which require 3+ hours per day of interdisciplinary therapy in a comprehensive inpatient rehab setting. Physiatrist is providing close team supervision and 24 hour management of active medical problems listed below. Physiatrist and rehab team continue to assess barriers to discharge/monitor patient progress toward functional and medical goals  Care Tool:  Bathing    Body parts bathed by patient: Right arm, Left arm, Chest, Abdomen, Front perineal area, Buttocks, Left lower leg, Right lower leg, Right upper leg, Left upper leg, Face         Bathing assist Assist Level: Minimal Assistance - Patient > 75%     Upper Body  Dressing/Undressing Upper body dressing   What is the patient wearing?: Pull over shirt    Upper body assist Assist Level: Minimal Assistance - Patient > 75%    Lower Body Dressing/Undressing Lower body dressing      What is the patient wearing?: Underwear/pull up, Pants     Lower body assist Assist for lower body dressing: Minimal Assistance - Patient > 75%     Toileting Toileting    Toileting assist Assist for toileting: Minimal Assistance - Patient > 75%     Transfers Chair/bed transfer  Transfers assist     Chair/bed transfer assist level: Minimal Assistance - Patient > 75%     Locomotion Ambulation   Ambulation assist      Assist level: Minimal Assistance - Patient > 75% Assistive device:  Walker-rolling Max distance: 155   Walk 10 feet activity   Assist     Assist level: Minimal Assistance - Patient > 75% Assistive device: Walker-rolling   Walk 50 feet activity   Assist    Assist level: Minimal Assistance - Patient > 75% Assistive device: Walker-rolling    Walk 150 feet activity   Assist    Assist level: Minimal Assistance - Patient > 75% Assistive device: Walker-rolling    Walk 10 feet on uneven surface  activity   Assist           Wheelchair     Assist Is the patient using a wheelchair?: Yes Type of Wheelchair: Manual    Wheelchair assist level: Minimal Assistance - Patient > 75% Max wheelchair distance: 150    Wheelchair 50 feet with 2 turns activity    Assist        Assist Level: Minimal Assistance - Patient > 75%   Wheelchair 150 feet activity     Assist      Assist Level: Minimal Assistance - Patient > 75%   Blood pressure (!) 98/51, pulse 61, temperature 98 F (36.7 C), resp. rate 17, height 5' 7.75" (1.721 m), weight 60.6 kg, SpO2 99%.  Medical Problem List and Plan: 1. Functional deficits secondary to seizures- ran out meds due to insurance issues with more cognitive deficits and increased weakness above baseline             -patient may  shower             -ELOS/Goals: 2-2.5 weeks- min A to supervision - 7/27             -Continue CIR therapies including PT, OT    2.  Antithrombotics: -DVT/anticoagulation:  Pharmaceutical: Cannot have Eliquis or Xarelto due to interaction with Phenytoin- will change to tx dose lovenox 60mg  q12h and transition to Coumadin.              -antiplatelet therapy: none   3. Pain Management: Tylenol as needed (no tramadol)             -Lidoderm as needed   4. Mood/Behavior/Sleep: LCSW to evaluate and provide emotional support (no trazodone)             -antipsychotic agents: n/a  -7/20-22/24 sleeping fairly well, monitor   5. Neuropsych/cognition: This patient is capable  of making decisions on her own behalf.   6. Skin/Wound Care: Routine skin care checks    - No apparent lesions in mouth, tongue s/p ?seizure  7. Fluids/Electrolytes/Nutrition: Routine Is and Os and follow-up chemistries             -hypoalbuminemia/hypoproteinemia>>start Juven             -  suboptimal B12 level (<400) continue vitamin B12 daily    7/22 potassium 3.4---supplement, other labs ok  8: Paroxysmal atrial fib: Eliquis started 7/18; prior IVC filter placement             -continue diltiazem 30 mg BID  -7/20-21/24 HR well controlled  -7-23: Mildly hypotensive this a.m., placed holding parameters on diltiazem SBP less than 90, DBP less than 60, heart rate less than 50.  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 Discussed with Dr. Melynda Ripple; increase Topomax to 50 mg/100 mg  -02/23/23 phenytoin level low at 2.7 on 02/20/23, Dr. Shearon Stalls spoke with Dr. Derry Lory of neuro--- 7/23 no recommended dose adjustments, per Dr. 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 CVA tenderness/flank pain-= CT done- was negative for any issues  17. Constipation- will order Sorbitol 30cc    - Large BM 7/19  -02/22/23 LBM yesterday, monitor  18. R foot pain and bruising: noted 02/22/23, xray shows no fx or acute findings, suspect contusion  -area appears unchanged compared to reports -continue ice to area and monitor.   -area still tender.  -encouraged appropriate shoe wear when out of bed  - 7/23: added lidocaine patch to plantar foot.  Sounds like likely overlying plantar fasciitis, discussed treatment with medication and use of tennis ball/golf ball to stretch tissue, possible injections versus release as outpatient  LOS: 5 days A FACE TO FACE EVALUATION WAS PERFORMED  Angelina Sheriff 02/24/2023, 8:48 AM

## 2023-02-24 NOTE — Progress Notes (Signed)
Physical Therapy Session Note  Patient Details  Name: [name] MRN: [id number] Date of Birth: [birth year]  Today's Date: 02/24/2023 PT Individual Time: 1914-7829 PT Individual Time Calculation (min): 40 min   Short Term Goals: Week 1:  PT Short Term Goal 1 (Week 1): Pt will transfer sit to stand w/ CGA. PT Short Term Goal 2 (Week 1): Pt will amb 150' w/ CGA and LRAD. PT Short Term Goal 3 (Week 1): Pt will negotiate 12 steps w/ 1 rail and CGA.  Skilled Therapeutic Interventions/Progress Updates:    Chart reviewed and pt agreeable to therapy. Pt received seated in WC with 10/10 c/o pain in RLE and foot on ice with K-Tape to relieve suspected plantar fascitis and soft BP (98/16mmHg). Session focused on BUE strengthening to promote functional strength in setting of current pain. Pt initiated session with WC propulsion of 260ft using MinA + VC. Pt then completed series of BUE exercises including 5# overhead press, T-band horizontal abduction and scap retraction with red band, D1 and D2 PNF. Pt then compelted 185ft WC propulsion with minA and strong VC for navigation technique to return to room. At end of session, pt was left seated in [facility] with alarm engaged, nurse call bell and all needs in reach.     Therapy Documentation Precautions:  Precautions Precautions: Fall Precaution Comments: seizure Restrictions Weight Bearing Restrictions: No     Therapy/Group: Individual Therapy  Dionne Milo, PT, DPT 02/24/2023, 12:48 PM

## 2023-02-24 NOTE — Patient Care Conference (Signed)
Inpatient RehabilitationTeam Conference and Plan of Care Update Date: 02/24/2023   Time: 10:22 AM   Patient Name: [name]      Medical Record Number: [id number]  Date of Birth: 1955/09/14 Sex: Female         Room/Bed: 4M07C/4M07C-01 Payor Info: Payor: [employer] MEDICARE / Plan: [employer] MEDICARE HMO/PPO / Product Type: *No Product type* /    Admit Date/Time:  02/19/2023  4:07 PM  Primary Diagnosis:  Seizure disorder [facility])  Hospital Problems: Principal Problem:   Seizure disorder [facility]) Active Problems:   Debility   Right foot pain    Expected Discharge Date: Expected Discharge Date: 02/28/23  Team Members Present: Physician leading conference: Dr. Elijah Birk Social Worker Present: Cecile Sheerer, LCSWA Nurse Present: Vedia Pereyra, RN PT Present: Malachi Pro, PT OT Present: Jake Shark, OT PPS Coordinator present : Fae Pippin, SLP     Current Status/Progress Goal Weekly Team Focus  Bowel/Bladder   pt continent of b/b   Remain continent   Assist with toileting qshift and prn    Swallow/Nutrition/ Hydration               ADL's   Amb with RW CGA fading to close S with min cues for shower, toilet and sink side access, Close S shower for UB and CGA for LB bathing with grab bar support, ADL set up with increased time and CGA for reaching (could use RW bag), CGA for LB dressing and set up UB dressing and grooming   S   functional balance, safety, sequenbing, overall processing and activity tolerance for ADLs and mobility, caregiver training and DME needs focus for d/c    Mobility   supervision bed mobility, CGA/minA sit to stand and ambulation >300' without AD   Supervision  high level balance, ambulation, endurance, safety awareness    Communication                Safety/Cognition/ Behavioral Observations               Pain   Pt c/o Left foot pain, prn tylenol given   <3 pain score   Assess qshift and prn    Skin   Generalized bruising,  skin intact   Maintain skin integrity  Assess skin qshift and prn      Discharge Planning:  Pt to discharge to home with her dtr as she lives with her family. Dtr works from home and will continue to provide 24/7 care. SW will confirm there are no barriers to discharge.   Team Discussion: Seizure disorder. Seizure precautions. Hypotensive. Sleeping well. INR therapeutic. Regular diet. Great support at home. Therapeutic taping for plantar fasciitis to see if helps with pain to right foot. Does have poor safety awareness. Does require verbal cueing and grab bar support in bathroom.  Patient on target to meet rehab goals: yes, patient progressing towards goals with a discharge date of 02/28/23  *See Care Plan and progress notes for long and short-term goals.   Revisions to Treatment Plan:  Monitoring blood pressure. Transitioned to Coumadin. Monitor labs/VS Teaching Needs: Medications, safety, self care, gait/transfer training, etc.   Current Barriers to Discharge: Decreased caregiver support  Possible Resolutions to Barriers: Family education Order recommended DME if needed.      Medical Summary Current Status: medically complicated by hypotension, seizure like activity, behavior/mood, hedache and foot pain  Barriers to Discharge: Behavior/Mood;Hypotension;Medical stability;Self-care education  Barriers to Discharge Comments: seizure disorder, foot pain, headache, hypotension Possible  Resolutions to [employer]: titrating pain medications, neurology assistance in antiseizure medication titration, vitals monitrroing for hypotension   Continued Need for Acute Rehabilitation Level of Care: The patient requires daily medical management by a physician with specialized training in physical medicine and rehabilitation for the following reasons: Direction of a multidisciplinary physical rehabilitation program to maximize functional independence : Yes Medical management of patient  stability for increased activity during participation in an intensive rehabilitation regime.: Yes Analysis of laboratory values and/or radiology reports with any subsequent need for medication adjustment and/or medical intervention. : Yes   I attest that I was present, lead the team conference, and concur with the assessment and plan of the team.   Jearld Adjutant 02/24/2023, 2:03 PM

## 2023-02-24 NOTE — Progress Notes (Signed)
Patient ID: [name], female   DOB: [birth year], 67 y.o.   MRN: [id number]  SW met with pt  in room, and called pt dtr [name] while in room to inform on gains made in rehab, and d/c date 7/27. Discussed recommendation for outpatient therapies. Pt dtr is unable to transport due to patient upcoming radiation. States can only manage HH. No  preferred HHA. Reports has RW and shower chair with back already.   SW sent HHPT/OT referral to [facility] and waiting on follow-up.   Cecile Sheerer, MSW, LCSWA Office: [phone] Cell: [phone] Fax: [phone]

## 2023-02-24 NOTE — Progress Notes (Signed)
ANTICOAGULATION CONSULT NOTE  Pharmacy Consult: Lovenox / Warfarin  Indication: DVT  Allergies  Allergen Reactions   Zonegran [Zonisamide] Other (See Comments)    Numbness and tingling over whole body   Chocolate Other (See Comments)    Triggers seizures   Codeine Nausea And Vomiting   Keppra [Levetiracetam] Other (See Comments)    Causes seizures   Lyrica [Pregabalin] Nausea And Vomiting   Olive Oil Rash    Patient Measurements: Height: 5' 7.75" (172.1 cm) Weight: 60.6 kg (133 lb 9.6 oz) IBW/kg (Calculated) : 63.33  Vital Signs: Temp: 98 F (36.7 C) (07/23 0601) BP: 98/51 (07/23 0849) Pulse Rate: 61 (07/23 0601)  Labs: Recent Labs    02/22/23 0656 02/23/23 0808 02/24/23 0746  HGB  --  11.2*  --   HCT  --  35.0*  --   PLT  --  359  --   LABPROT 18.3* 20.6* 24.7*  INR 1.5* 1.7* 2.2*  CREATININE  --  0.78  --     Estimated Creatinine Clearance: 67.1 mL/min (by C-G formula based on SCr of 0.78 mg/dL).   Assessment: 67 year old female with a history of AVM rupture in 1989 s/p craniectomy and subsequent epilepsy since that time, history of prior DVT 2022 and afib/aflutter not on anticoagulation PTA due to high risk for bleeding complications (falls, hx ICH) who presented on 7/8 to [facility] with breakthrough seizures now with concerns for new DVT.  Now on Warfarin wit enoxaparin bridge. CBC stable, platelets 250. No issues with bleeding reported. INR therapeutic and  trending up now at 2.2 (up from 1.7). Continue warfarin, stop enoxaparin bridge due to history of bleeding complications.  Goal of Therapy:  INR 2 to 3 Monitor platelets by anticoagulation protocol: Yes   Plan:  Give warfarin 5 mg PO x1 dose Check INR daily while on warfarin Continue to monitor H&H and platelets  Thank you for allowing pharmacy to be a part of this patient's care.  Thelma Barge, PharmD Clinical Pharmacist

## 2023-02-24 NOTE — Progress Notes (Signed)
RN went to give patient medication for pain and indigestion and she said pain goes from her stomach and goes up to her chest. Patient is comfortable and on her celfone. We will continue to monitor.

## 2023-02-24 NOTE — Progress Notes (Signed)
Physical Therapy Session Note  Patient Details  Name: [name] MRN: [id number] Date of Birth: 02/12/1956  Today's Date: 02/24/2023 PT Individual Time: 1120-1203 and 1346-1430 PT Individual Time Calculation (min): 43 min and 44 min  Short Term Goals: Week 1:  PT Short Term Goal 1 (Week 1): Pt will transfer sit to stand w/ CGA. PT Short Term Goal 2 (Week 1): Pt will amb 150' w/ CGA and LRAD. PT Short Term Goal 3 (Week 1): Pt will negotiate 12 steps w/ 1 rail and CGA.  Skilled Therapeutic Interventions/Progress Updates:     1st Session: Pt received seated in bed and agrees to therapy. Reports pain in Rt foot. Number not provided. PT provides rest breaks as needed to manage pain. Pt performs sit to stand and stand step to [facility] with CGA and cues for positioning. WC transport to gym. Pt's PB assessed due to report of low BP earlier in day. Pt at 107/65 and no symptoms of hypotension. Stand step to mat table with close supervision and cues for sequencing. Pt stands and attempts to ambulate without AD, but reports increase in pain in Rt foot. PT suggests use of RW and pt is agreeable. Pt ambulates x500' with RW and close supervision, with cues for upright gaze to improve posture and balance, and decreasing WB through RW for energy conservation. Following rest break, pt ambulates x500 but with PT providing spontaneous cues for turns, stops, and pivots. Intended to challenge pt's direction following and dynamic balance. Pt completes with close supervision and RW, with no overt LOBs. WC transport back to room. Pt left seated with alarm intact and all needs within reach.   2nd Session: Pt handed off from OT in gym. Pt reports pain in Rt foot but states that chest pain has resolved. PT provides rest breaks as needed to manage pain. Pt performs sit to stand x5 with cues to not utilize hands to increase balance challenge and loading through lower extremities. Pt noted to lean against mat with backs of legs for  support. PT cues pt to scoot forward and pt performs additional x5 with increased postural sway and requiring several attempts on multiple reps.   Pt then ambulates x150' with RW and cues for safe AD management. Seated rest break. Pt then performs 2x5 reps of sit to stand while holding onto 3lb bar to provide strengthening through upper extremities as well as core engagement. Pt requires occasional minA to facilitate anterior weight shift. Pt then completes x15 bar raises with 3lb bar, holding with BUEs outstretched in front of body and lifting to overhead. Performed for strengthening, core engagement, and balance challenge.   Pt participates in bean bag tossing activity to challenge dynamic standing balance and coordination. PT provides CGA/minA due to pt impulsivity, but no overt LOBs noted. Pt tosses ~15 bags and following rest break ambulates to retrieve bags from floor with CGA and cues for safety.   Pt ambulates x300' back to room with RW and same assistance and cues. Left seated in bed with alarm intact and all needs within reach.   Therapy Documentation Precautions:  Precautions Precautions: Fall Precaution Comments: seizure Restrictions Weight Bearing Restrictions: No   Therapy/Group: Individual Therapy  Beau Fanny, PT, DPT 02/24/2023, 4:37 PM

## 2023-02-24 NOTE — Progress Notes (Signed)
Occupational Therapy Session Note  Patient Details  Name: [name] MRN: [id number] Date of Birth: September 24, 1955  Today's Date: 02/24/2023 OT Individual Time: 1300-1345 OT Individual Time Calculation (min): 45 min    Short Term Goals: Week 1:  OT Short Term Goal 1 (Week 1): LTG=STG 2/2 ELOS  Skilled Therapeutic Interventions/Progress Updates:    Pt greeted seated in wc and agreeable to OT treatment session. Pt declined need to go to the bathroom. Pt reported continued pain in R foot. Pt agreeable to ambulate to therapy gym w/ RW and CGA. Addressed standing balance/endurance and coordination with alternating toe taps on medium cones. Pt with some ataxia present on L side which pt stated is from her head injury from years ago. Addressed core strength and coordination with graded peg board task while in quadruped. Pt had difficulty with L lower quadrant due to baseline vision impairment. Pt ambulated back to room with RW and left seated in wc with alarm on, call bell in reach and needs met.   Therapy Documentation Precautions:  Precautions Precautions: Fall Precaution Comments: seizure Restrictions Weight Bearing Restrictions: No Pain: Pain Assessment Pain Scale: 0-10 Pain Score: 8 Aching pain in foot, rest and repositioned     Therapy/Group: Individual Therapy  [name] 02/24/2023, 1:39 PM

## 2023-02-24 NOTE — Progress Notes (Signed)
Occupational Therapy Session Note  Patient Details  Name: [name] MRN: [id number] Date of Birth: 09/03/1955  Today's Date: 02/24/2023 OT Individual Time: 4098-1191 OT Individual Time Calculation (min): 55 min    Short Term Goals: Week 1:  OT Short Term Goal 1 (Week 1): LTG=STG 2/2 ELOS      Skilled Therapeutic Interventions/Progress Updates:    Pt received lying supine in bed.  Pt had c/o RLE arch pain in her foot.  Stated her pain was "50/10".  Requested tylenol and nurse made aware.  Pt receptive to OT treatment session and wanted to take a shower.  Supine >EOB with (S).  Funcitonal mobility ~34ft to shower with with RW and CGA.  UB bathing completed with (S) and min cues for sequencing and and attention. LB bathing competed with (S) and mine cues for sequencing and attention.  Functional mobility~47ft with RW back to EOB.  Pt able to complete upper and LB dressing with (S) and min verbal cues for attention.  Stand>pivot transfer from bed to wheelchair without AD completed with CGA.  Kinesotape applied to plantar portion of RLE for increased support. Ice also applied to area to help with discomfort.  Pt left sitting in wheelchair with call light in reach, chair alarm set and all needs met.   Therapy Documentation Precautions:  Precautions Precautions: Fall Precaution Comments: seizure Restrictions Weight Bearing Restrictions: No     Therapy/Group: Individual Therapy  Liam Graham 02/24/2023, 7:17 AM

## 2023-02-24 NOTE — Progress Notes (Signed)
Per therapy patient c/o chest pain at the end of their session. RN went to check patient she is eating at the moment claims pain goes on and off ;  mostly when she breathes she said maybe indigestion and points to upper chest.  Dois Davenport PA notified.

## 2023-02-25 DIAGNOSIS — M79671 Pain in right foot: Secondary | ICD-10-CM | POA: Diagnosis not present

## 2023-02-25 DIAGNOSIS — I4891 Unspecified atrial fibrillation: Secondary | ICD-10-CM | POA: Diagnosis not present

## 2023-02-25 DIAGNOSIS — G40909 Epilepsy, unspecified, not intractable, without status epilepticus: Secondary | ICD-10-CM | POA: Diagnosis not present

## 2023-02-25 DIAGNOSIS — R32 Unspecified urinary incontinence: Secondary | ICD-10-CM | POA: Diagnosis not present

## 2023-02-25 LAB — PROTIME-INR
INR: 2.2 — ABNORMAL HIGH (ref 0.8–1.2)
Prothrombin Time: 24.2 seconds — ABNORMAL HIGH (ref 11.4–15.2)

## 2023-02-25 MED ORDER — WARFARIN SODIUM 6 MG PO TABS
6.0000 mg | ORAL_TABLET | Freq: Once | ORAL | Status: AC
  Administered 2023-02-25: 6 mg via ORAL
  Filled 2023-02-25: qty 1

## 2023-02-25 MED ORDER — DICLOFENAC SODIUM 1 % EX GEL
2.0000 g | Freq: Four times a day (QID) | CUTANEOUS | Status: DC
  Administered 2023-02-25 – 2023-02-27 (×11): 2 g via TOPICAL
  Filled 2023-02-25: qty 100

## 2023-02-25 NOTE — Progress Notes (Signed)
PROGRESS NOTE   Subjective/Complaints:  No events overnight.  +Heartburn yesterday self-resolved Patient endorses loss of bladder continence around 4 am last night; thinks her mouth is sore and she may have bit her tongue. No concerning findings on exam, no documented incontinence per chart review.  Vitals stable LBM 7/23  ROS:  + R foot pain + Headaches + incontinence/seizures Patient denies fever, rash, sore throat, blurred vision, dizziness, nausea, vomiting, diarrhea, cough, shortness of breath or chest pain,  headache, or mood change.     Objective:   No results found. Recent Labs    02/23/23 0808  WBC 5.1  HGB 11.2*  HCT 35.0*  PLT 359   Recent Labs    02/23/23 0808  NA 138  K 3.4*  CL 110  CO2 21*  GLUCOSE 114*  BUN 9  CREATININE 0.78  CALCIUM 8.8*    Intake/Output Summary (Last 24 hours) at 02/25/2023 0754 Last data filed at 02/24/2023 1844 Gross per 24 hour  Intake 478 ml  Output --  Net 478 ml         Physical Exam: Vital Signs Blood pressure 119/67, pulse 67, temperature 98.1 F (36.7 C), temperature source Oral, resp. rate 16, height 5' 7.75" (1.721 m), weight 60.6 kg, SpO2 99%.   Constitutional: No distress . Vital signs reviewed. Working with OT brushing hair after shower.  HEENT: NCAT, EOMI, oral membranes moist Neck: supple Cardiovascular: RRR without murmur. No JVD    Respiratory/Chest: CTA Bilaterally without wheezes or rales. Normal effort    GI/Abdomen: BS +, non-tender, non-distended. No trauma apparent in oral cavity.  Ext: no clubbing, cyanosis, or edema Psych: pleasant and cooperative, sl anxious  Neuro: AAOx4. No apparent cognitive deficits.   MsK: R foot with bruising from arch up to plantar aspect of great toe area, remains exquisitely TTP from the posterior calcaneus along the arch and 1st metatarsal, no crepitus or deformity.   Sensory exam: revealed normal  sensation in all dermatomal regions in bilateral upper extremities and bilateral lower extremities Motor exam: strength 5- to 5/5 throughout bilateral upper extremities and bilateral lower extremities  Coordination: Fine motor coordination was normal.        Assessment/Plan: 1. Functional deficits which require 3+ hours per day of interdisciplinary therapy in a comprehensive inpatient rehab setting. Physiatrist is providing close team supervision and 24 hour management of active medical problems listed below. Physiatrist and rehab team continue to assess barriers to discharge/monitor patient progress toward functional and medical goals  Care Tool:  Bathing    Body parts bathed by patient: Right arm, Left arm, Chest, Abdomen, Front perineal area, Buttocks, Left lower leg, Right lower leg, Right upper leg, Left upper leg, Face         Bathing assist Assist Level: Minimal Assistance - Patient > 75%     Upper Body Dressing/Undressing Upper body dressing   What is the patient wearing?: Pull over shirt    Upper body assist Assist Level: Minimal Assistance - Patient > 75%    Lower Body Dressing/Undressing Lower body dressing      What is the patient wearing?: Underwear/pull up, Pants     Lower  body assist Assist for lower body dressing: Minimal Assistance - Patient > 75%     Toileting Toileting    Toileting assist Assist for toileting: Minimal Assistance - Patient > 75%     Transfers Chair/bed transfer  Transfers assist     Chair/bed transfer assist level: Minimal Assistance - Patient > 75%     Locomotion Ambulation   Ambulation assist      Assist level: Minimal Assistance - Patient > 75% Assistive device: Walker-rolling Max distance: 155   Walk 10 feet activity   Assist     Assist level: Minimal Assistance - Patient > 75% Assistive device: Walker-rolling   Walk 50 feet activity   Assist    Assist level: Minimal Assistance - Patient >  75% Assistive device: Walker-rolling    Walk 150 feet activity   Assist    Assist level: Minimal Assistance - Patient > 75% Assistive device: Walker-rolling    Walk 10 feet on uneven surface  activity   Assist           Wheelchair     Assist Is the patient using a wheelchair?: Yes Type of Wheelchair: Manual    Wheelchair assist level: Minimal Assistance - Patient > 75% Max wheelchair distance: 150    Wheelchair 50 feet with 2 turns activity    Assist        Assist Level: Minimal Assistance - Patient > 75%   Wheelchair 150 feet activity     Assist      Assist Level: Minimal Assistance - Patient > 75%   Blood pressure 119/67, pulse 67, temperature 98.1 F (36.7 C), temperature source Oral, resp. rate 16, height 5' 7.75" (1.721 m), weight 60.6 kg, SpO2 99%.  Medical Problem List and Plan: 1. Functional deficits secondary to seizures- ran out meds due to insurance issues with more cognitive deficits and increased weakness above baseline             -patient may  shower             -ELOS/Goals: 2-2.5 weeks- min A to supervision - 7/27             -Continue CIR therapies including PT, OT    2.  Antithrombotics: -DVT/anticoagulation:  Pharmaceutical: Cannot have Eliquis or Xarelto due to interaction with Phenytoin- will change to tx dose lovenox 60mg  q12h and transition to Coumadin.              -antiplatelet therapy: none   3. Pain Management: Tylenol as needed (no tramadol)             -Lidoderm as needed   4. Mood/Behavior/Sleep: LCSW to evaluate and provide emotional support (no trazodone)             -antipsychotic agents: n/a  -7/20-22/24 sleeping fairly well, monitor   5. Neuropsych/cognition: This patient is capable of making decisions on her own behalf.   6. Skin/Wound Care: Routine skin care checks    - No apparent lesions in mouth, tongue s/p ?seizure  7. Fluids/Electrolytes/Nutrition: Routine Is and Os and follow-up chemistries              -hypoalbuminemia/hypoproteinemia>>start Juven             -suboptimal B12 level (<400) continue vitamin B12 daily    7/22 potassium 3.4---supplement, other labs ok  8: Paroxysmal atrial fib: Eliquis started 7/18; prior IVC filter placement             -  continue diltiazem 30 mg BID  -7/20-21/24 HR well controlled  -7-23: Mildly hypotensive this a.m., placed holding parameters on diltiazem SBP less than 90, DBP less than 60, heart rate less than 50.   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 Discussed with Dr. Melynda Ripple; increase Topomax to 50 mg/100 mg  -02/23/23 phenytoin level low at 2.7 on 02/20/23, Dr. Shearon Stalls spoke with Dr. Derry Lory of neuro--- 7/23 no recommended dose adjustments, per Dr. Melynda Ripple likely fast metabolizer, will follow-up with outpatient -pt understands importance of maintaining her sz medication regimen   - 7/24: Endorsed incontinence again, approx. 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 CVA tenderness/flank pain-= CT done- was negative for any issues  17. Constipation- will order Sorbitol 30cc    - Large BM 7/19  -02/22/23 LBM yesterday   18. R foot pain and bruising: noted 02/22/23, xray shows no fx or acute findings, suspect contusion  -area appears unchanged compared to reports -continue ice to area and monitor.   -area still tender.  -encouraged appropriate shoe wear when out of bed  - 7/23: added lidocaine patch to plantar foot.  Sounds like likely overlying plantar fasciitis, discussed treatment with medication and use of tennis ball/golf ball to stretch tissue, possible injections versus release as outpatient   - 7/24: Voltaren gel QID to foot   LOS: 6 days A FACE TO FACE EVALUATION WAS PERFORMED  Angelina Sheriff 02/25/2023, 7:54 AM

## 2023-02-25 NOTE — Progress Notes (Addendum)
Messaged Dr. Wilford Corner via Loretha Stapler regarding episode of urinary incontinence overnight.  Messaged on-call neuro APP regarding above.

## 2023-02-25 NOTE — Progress Notes (Signed)
ANTICOAGULATION CONSULT NOTE  Pharmacy Consult: Lovenox / Warfarin  Indication: DVT  Allergies  Allergen Reactions   Zonegran [Zonisamide] Other (See Comments)    Numbness and tingling over whole body   Chocolate Other (See Comments)    Triggers seizures   Codeine Nausea And Vomiting   Keppra [Levetiracetam] Other (See Comments)    Causes seizures   Lyrica [Pregabalin] Nausea And Vomiting   Olive Oil Rash    Patient Measurements: Height: 5' 7.75" (172.1 cm) Weight: 60.6 kg (133 lb 9.6 oz) IBW/kg (Calculated) : 63.33  Vital Signs: Temp: 98.1 F (36.7 C) (07/24 0345) Temp Source: Oral (07/24 0345) BP: 106/50 (07/24 0754) Pulse Rate: 64 (07/24 0754)  Labs: Recent Labs    02/23/23 0808 02/24/23 0746 02/25/23 0721  HGB 11.2*  --   --   HCT 35.0*  --   --   PLT 359  --   --   LABPROT 20.6* 24.7* 24.2*  INR 1.7* 2.2* 2.2*  CREATININE 0.78  --   --     Estimated Creatinine Clearance: 67.1 mL/min (by C-G formula based on SCr of 0.78 mg/dL).   Assessment: 67 year old female with a history of AVM rupture in 1989 s/p craniectomy and subsequent epilepsy since that time, history of prior DVT 2022 and afib/aflutter not on anticoagulation PTA due to high risk for bleeding complications (falls, hx ICH) who presented on 7/8 to [facility] with breakthrough seizures now with concerns for new DVT.  Now on Warfarin wit enoxaparin bridge. CBC stable, platelets 250. No issues with bleeding reported. INR therapeutic and  trending up now at 2.2. Continue warfarin, stopped enoxaparin bridge 7/23 due to history of bleeding complications. Average trend is ~6.25mg /day over past week. Consider 6mg /daily for maintenance dose.  Goal of Therapy:  INR 2 to 3 Monitor platelets by anticoagulation protocol: Yes   Plan:  Give warfarin 6 mg PO x1 dose Check INR daily while on warfarin Continue to monitor H&H and platelets  Thank you for allowing pharmacy to be a part of this patient's care.  Thelma Barge, PharmD Clinical Pharmacist

## 2023-02-25 NOTE — Progress Notes (Signed)
Physical Therapy Session Note  Patient Details  Name: [name] MRN: [id number] Date of Birth: 1955/12/20  Today's Date: 02/25/2023 PT Individual Time: 1050-1200 PT Individual Time Calculation (min): 70 min   Short Term Goals: Week 1:  PT Short Term Goal 1 (Week 1): Pt will transfer sit to stand w/ CGA. PT Short Term Goal 2 (Week 1): Pt will amb 150' w/ CGA and LRAD. PT Short Term Goal 3 (Week 1): Pt will negotiate 12 steps w/ 1 rail and CGA.  Skilled Therapeutic Interventions/Progress Updates:     Pt received seated in bed and agrees to therapy Reports ongoing pain in Rt foot. Number not provided. PT provides offloading with RW and rest breaks to manage pain. Pt performs sit to stand with cues for initiation. Pt ambulates x300' with RW and cues to relax upper extremities for improved body mechanics and energy conservation. Pt performs quadruped activity for core strengthening and coordination, tasked with reaching alternating arms forward, then alternating legs, x5 with each.   Pt attempts standing on Bosu ball to challenge balance and engage ankle strategy. Pt requires modA overall for standing balance on Bosu ball, with cues for posture and use of vision to assist with balance, but pt reports increase in Rt foot pain during activity, so activity is terminated.   Pt completes 6 minute Walk test with RW and close supervision, with score of 1026'. Following pt ambulates additional bouts of x400', and x700' with extended seated rest break. Pt ambulates back to room with RW and same cues. Left seated in bed with alarm intact and all needs within reach.   Therapy Documentation Precautions:  Precautions Precautions: Fall Precaution Comments: seizure Restrictions Weight Bearing Restrictions: No   Therapy/Group: Individual Therapy  Beau Fanny, PT, DPT 02/25/2023, 5:07 PM

## 2023-02-25 NOTE — Progress Notes (Signed)
Patient ID: [name], female   DOB: Dec 13, 1955, 67 y.o.   MRN: [id number]  SW informed by Angie/[facility] who was already following patient. SW sent HHPT/OT order.   SW called pt dtr [name] to inform on above.   Cecile Sheerer, MSW, LCSWA Office: [phone] Cell: [phone] Fax: [phone]

## 2023-02-25 NOTE — Progress Notes (Signed)
Occupational Therapy Session Note  Patient Details  Name: [name] MRN: [id number] Date of Birth: [birth year]  Session 1: Today's Date: 02/25/2023 OT Individual Time: 8657-8469 OT Individual Time Calculation (min): 78 min    Session 2: Today's Date: 02/25/2023 OT Individual Time: 1400-1500 OT Individual Time Calculation (min): 60 min    Short Term Goals: Week 1:  OT Short Term Goal 1 (Week 1): LTG=STG 2/2 ELOS    Skilled Therapeutic Interventions/Progress Updates: Session 1 Pt received lying supine in bed with HOB elevated.  Pt has c/o 10/10 RLE pain that is in her arch of the foot and radiates through the leg.  Nurse made are and is providing tylenol for pain. Nurse present in romm providing morning meds.  Pt recepotive to getting a shower.  Supine to EOB with (S).  Sit>stand with (S).  Functional mobiuliyt to bathroom ~33ft with CGA for safety.  Pt able to manipulate RW and side step into shower with CGA.  Upper and LB bathing completed while seated on tub shower bench with (S) and CGA required when  standing to warsh perineal area and buttocks.  Functional mobility back to bed for dre4ssing with CGA and RW.  Pt able to complete full body dressing while seated EOB with (S).  Pt continues to require min verbal cuing for attention to stay on task and sequencing.  Functional mobility to sink for self-care with (S) and RW.  Verbal reminders and demonstration required for keeping RW in front of self during self-care tasks for decreqased risk of falls.and ability of RW to provide bilateral UE support if she were to lose her balance.  DO entered room while rounding and pt expressed her concerns with LE foot pain and that she thought she may have had a seizure in the middle of the night.  Pt stated she had an incontinent episode which she stated usually only happens when she has a seizure.  Self-care of blow drying hair and oral care completed while seated in wheelchair at sink level with (S).  Fucntional mobility ~22ft back to bed with use of RW.  Pt left lying supine with HOB elevated, call light within reach, bed alarm set and all needs met.     Skilled Therapeutic Interventions/Progress Updates: Session 2 Pt received sitting up in bed on her phone.  Pt receptive to OT treatment session.  Pt stated she needed to void and brush her teeth prior to starting session .  Functional mobility to bathroom ~43ft with use of RW and close (S).  Functional mobilty~52ft to sink with use of RW with (S).  Pt able  to complete self care while standing at sink (brushing teeth) with close (S).  Pt agreeable to go to gym.  Functional mobility to gym with use of RW ~14ft with (S) and min verbal cuing for directions.  Pt able to complete plantar stretching exercises. Theraband dorsiflexion exercises on R foot completed sitting EOM 3x10 to help increase strength and decrease pain for increased independence with ADLs. Multiple variations of dorsiflexion exercises completed 3x10 throughout session.  Intermittent pain reported with some variations of exercises.  Pt encouraged to self guide pressure for self management during exercises. Calf raises completed 3x10 with BUE support from RW.  Pt did express difficulty with following instructions even when provided with visual demonstration.  Min verbal cues required for sequencing, and attentions during entire treatment session. Functional mobility ~11ft back to room with (S) and use of RW.  Pt back  in bed.  OTS provided pt with ice to place on R LE at plantar area.  Call light within reach, bed alarm set and all needs met.  Therapy Documentation Precautions:  Precautions Precautions: Fall Precaution Comments: seizure Restrictions Weight Bearing Restrictions: No      Therapy/Group: Individual Therapy  Liam Graham 02/25/2023, 7:24 AM

## 2023-02-25 NOTE — Plan of Care (Signed)
Received a call from PMR APP regarding patient's concern for an episode of urinary incontinence overnight.  No change in exam.  No witnessed seizures. She is compliant with medications in the hospital. 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 I am not worried about 1 episode of urinary incontinence at this time. I do not recommend EEGs or any further evaluation. If the patient has a seizure, has changes in mental status or any other symptoms concerning for seizures, please recall neurology at the time. Continue the current regimen as lastly recommended by the neurologist on their evaluation while she was inpatient. We will be available with questions as needed.   -- Milon Dikes, MD Neurologist [facility] Pager: [phone]

## 2023-02-26 ENCOUNTER — Inpatient Hospital Stay (INDEPENDENT_AMBULATORY_CARE_PROVIDER_SITE_OTHER): Payer: Medicare HMO

## 2023-02-26 ENCOUNTER — Telehealth: Payer: Self-pay | Admitting: Physician Assistant

## 2023-02-26 ENCOUNTER — Other Ambulatory Visit (HOSPITAL_COMMUNITY): Payer: Self-pay

## 2023-02-26 DIAGNOSIS — I4892 Unspecified atrial flutter: Secondary | ICD-10-CM

## 2023-02-26 DIAGNOSIS — Z9119 Patient's noncompliance with other medical treatment and regimen due to financial hardship: Secondary | ICD-10-CM | POA: Diagnosis not present

## 2023-02-26 DIAGNOSIS — G40909 Epilepsy, unspecified, not intractable, without status epilepticus: Secondary | ICD-10-CM | POA: Diagnosis not present

## 2023-02-26 DIAGNOSIS — I4891 Unspecified atrial fibrillation: Secondary | ICD-10-CM | POA: Diagnosis not present

## 2023-02-26 LAB — PROTIME-INR
INR: 2.1 — ABNORMAL HIGH (ref 0.8–1.2)
Prothrombin Time: 24 seconds — ABNORMAL HIGH (ref 11.4–15.2)

## 2023-02-26 LAB — CBC
HCT: 35.2 % — ABNORMAL LOW (ref 36.0–46.0)
MCH: 33.1 pg (ref 26.0–34.0)
MCV: 106 fL — ABNORMAL HIGH (ref 80.0–100.0)
Platelets: 299 10*3/uL (ref 150–400)
RBC: 3.32 MIL/uL — ABNORMAL LOW (ref 3.87–5.11)
RDW: 13.3 % (ref 11.5–15.5)

## 2023-02-26 MED ORDER — WARFARIN SODIUM 6 MG PO TABS
6.0000 mg | ORAL_TABLET | Freq: Once | ORAL | Status: AC
  Administered 2023-02-26: 6 mg via ORAL
  Filled 2023-02-26: qty 1

## 2023-02-26 NOTE — Progress Notes (Signed)
ANTICOAGULATION CONSULT NOTE- follow-up  Pharmacy Consult: Lovenox / Warfarin  Indication: DVT  Allergies  Allergen Reactions   Zonegran [Zonisamide] Other (See Comments)    Numbness and tingling over whole body   Chocolate Other (See Comments)    Triggers seizures   Codeine Nausea And Vomiting   Keppra [Levetiracetam] Other (See Comments)    Causes seizures   Lyrica [Pregabalin] Nausea And Vomiting   Olive Oil Rash    Patient Measurements: Height: 5' 7.75" (172.1 cm) Weight: 60.6 kg (133 lb 9.6 oz) IBW/kg (Calculated) : 63.33  Vital Signs: Temp: 98 F (36.7 C) (07/25 0426) Temp Source: Oral (07/25 0426) BP: 107/46 (07/25 0746) Pulse Rate: 53 (07/25 0426)  Labs: Recent Labs    02/24/23 0746 02/25/23 0721 02/26/23 0804  HGB  --   --  11.0*  HCT  --   --  35.2*  PLT  --   --  299  LABPROT 24.7* 24.2* 24.0*  INR 2.2* 2.2* 2.1*    Estimated Creatinine Clearance: 67.1 mL/min (by C-G formula based on SCr of 0.78 mg/dL).   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  Now on Warfarin wit enoxaparin bridge. CBC stable, platelets 250. No issues with bleeding reported. INR therapeutic and  trending up now at 2.2. Continue warfarin, stopped enoxaparin bridge 7/23 due to history of bleeding complications. Average trend is ~6.25mg /day over past week. Consider 6mg /daily for maintenance dose.  7/25 AM update: INR 2.1 Hgb 11 G/dL No bleeding noted by nursing  Goal of Therapy:  INR 2 to 3 Monitor platelets by anticoagulation protocol: Yes   Plan:  Give warfarin 6 mg PO x1 dose Check INR daily while on warfarin Continue to monitor H&H and platelets  Thank you for allowing  pharmacy to be a part of this patient's care.  Greta Doom BS, PharmD, BCPS Clinical Pharmacist 02/26/2023 9:55 AM  Contact: [phone] after 3 PM  "Be curious, not judgmental..." -Debbora Dus

## 2023-02-26 NOTE — Progress Notes (Signed)
PROGRESS NOTE   Subjective/Complaints:  No events overnight.  No acute complaints; ongoing pain in her right foot, which is stable to mildly improved, patient states she thinks she will just have to let it heal. LBM 7/23 Has not gotten diltiazem last 3 doses D/t HR in 50s, DBP 40s Eating 100% meals. No further documented incontinence.   Please see notes patient was completing paperwork this morning to state that she can live independently; discussed with her that from a cognitive and functional standpoint, this is not advisable in the near future.  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  She is aware that she cannot drive because of her seizures.  Discussed how it is not medically advisable that she does not have supervision at discharge, and can readdress as outpatient as she progresses with therapies.  Patient agreeable for now.  ROS:  + R foot pain-ongoing + Headaches-resolved + incontinence/seizures-resolved Patient denies fever, rash, sore throat, blurred vision, dizziness, nausea, vomiting, diarrhea, cough, shortness of breath or chest pain,  headache, or mood change.     Objective:   No results found. Recent Labs    02/26/23 0804  WBC 4.2  HGB 11.0*  HCT 35.2*  PLT 299   No results for input(s): "NA", "K", "CL", "CO2", "GLUCOSE", "BUN", "CREATININE", "CALCIUM" in the last 72 hours.   Intake/Output Summary (Last 24 hours) at 02/26/2023 0904 Last data filed at 02/26/2023 0743 Gross per 24 hour  Intake 600 ml  Output --  Net 600 ml         Physical Exam: Vital Signs Blood pressure (!) 107/46, pulse (!) 53, temperature 98 F (36.7 C), temperature source Oral, resp. rate 16, height 5' 7.75" (1.721 m), weight 60.6 kg,  SpO2 97%.   Constitutional: No distress . Vital signs reviewed.  Ambulating in the hallway with PT with rolling walker.  HEENT: NCAT, EOMI, oral membranes moist Neck: supple Cardiovascular: RRR without murmur. No JVD    Respiratory/Chest: CTA Bilaterally without wheezes or rales. Normal effort    GI/Abdomen: BS +, non-tender, non-distended. No trauma apparent in oral cavity.  Ext: no clubbing, cyanosis, or edema Psych: pleasant and cooperative, sl anxious  Neuro: AAOx4.  + Poor insight into cognitive deficits + Poor memory MsK: R foot with bruising from arch up to plantar aspect of great toe area, remains exquisitely TTP from the posterior calcaneus along the arch and 1st metatarsal, no crepitus or deformity-covered Sensory exam: revealed normal sensation in all dermatomal regions in bilateral upper extremities and bilateral lower extremities Motor exam: strength 5- to 5/5 throughout bilateral upper extremities and bilateral lower extremities  Coordination: Fine motor coordination was normal.        Assessment/Plan: 1. Functional deficits which require 3+ hours per day of interdisciplinary therapy in a comprehensive inpatient rehab setting. Physiatrist is providing close team supervision and 24 hour management of active medical problems listed below. Physiatrist and rehab team continue to assess barriers to discharge/monitor patient progress toward functional  and medical goals  Care Tool:  Bathing    Body parts bathed by patient: Right arm, Left arm, Chest, Abdomen, Front perineal area, Buttocks, Left lower leg, Right lower leg, Right upper leg, Left upper leg, Face         Bathing assist Assist Level: Minimal Assistance - Patient > 75%     Upper Body Dressing/Undressing Upper body dressing   What is the patient wearing?: Pull over shirt    Upper body assist Assist Level: Minimal Assistance - Patient > 75%    Lower Body Dressing/Undressing Lower body dressing      What  is the patient wearing?: Underwear/pull up, Pants     Lower body assist Assist for lower body dressing: Minimal Assistance - Patient > 75%     Toileting Toileting    Toileting assist Assist for toileting: Minimal Assistance - Patient > 75%     Transfers Chair/bed transfer  Transfers assist     Chair/bed transfer assist level: Minimal Assistance - Patient > 75%     Locomotion Ambulation   Ambulation assist      Assist level: Minimal Assistance - Patient > 75% Assistive device: Walker-rolling Max distance: 155   Walk 10 feet activity   Assist     Assist level: Minimal Assistance - Patient > 75% Assistive device: Walker-rolling   Walk 50 feet activity   Assist    Assist level: Minimal Assistance - Patient > 75% Assistive device: Walker-rolling    Walk 150 feet activity   Assist    Assist level: Minimal Assistance - Patient > 75% Assistive device: Walker-rolling    Walk 10 feet on uneven surface  activity   Assist           Wheelchair     Assist Is the patient using a wheelchair?: Yes Type of Wheelchair: Manual    Wheelchair assist level: Minimal Assistance - Patient > 75% Max wheelchair distance: 150    Wheelchair 50 feet with 2 turns activity    Assist        Assist Level: Minimal Assistance - Patient > 75%   Wheelchair 150 feet activity     Assist      Assist Level: Minimal Assistance - Patient > 75%   Blood pressure (!) 107/46, pulse (!) 53, temperature 98 F (36.7 C), temperature source Oral, resp. rate 16, height 5' 7.75" (1.721 m), weight 60.6 kg, SpO2 97%.  Medical Problem List and Plan: 1. Functional deficits secondary to seizures- ran out meds due to insurance issues with more cognitive deficits and increased weakness above baseline             -patient may  shower             -ELOS/Goals: 2-2.5 weeks- min A to supervision - 7/27             -Continue CIR therapies including PT, OT    2.   Antithrombotics: -DVT/anticoagulation:  Pharmaceutical: Cannot have Eliquis or Xarelto due to interaction with Phenytoin- will change to tx dose lovenox 60mg  q12h and transition to Coumadin. -Contacted PCP regarding Coumadin monitoring as outpatient             -antiplatelet therapy: none   3. Pain Management: Tylenol as needed (no tramadol)             -Lidoderm as needed   4. Mood/Behavior/Sleep: LCSW to evaluate and provide emotional support (no trazodone)             -  antipsychotic agents: n/a  -7/20-22/24 sleeping fairly well, monitor   5. Neuropsych/cognition: This patient is capable of making decisions on her own behalf.   6. Skin/Wound Care: Routine skin care checks    - No apparent lesions in mouth, tongue s/p ?seizure  7. Fluids/Electrolytes/Nutrition: Routine Is and Os and follow-up chemistries             -hypoalbuminemia/hypoproteinemia>>start Juven             -suboptimal B12 level (<400) continue vitamin B12 daily    7/22 potassium 3.4---supplement, other labs ok  8: Paroxysmal atrial fib: Eliquis started 7/18-> Coumadin; prior IVC filter placement             -continue diltiazem 30 mg BID  -7/20-21/24 HR well controlled  -7-23: Mildly hypotensive this a.m., placed holding parameters on diltiazem SBP less than 90, DBP less than 60, heart rate less than 50.   - 7/ 25: Hypotensive and slightly bradycardic, have needed to hold last 5 doses of Cardizem. Will get Cardiology input on DC med recs today.   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 Discussed with Dr. Melynda Ripple; increase Topomax to 50 mg/100 mg  -02/23/23 phenytoin level low at 2.7 on 02/20/23, Dr. Shearon Stalls spoke with Dr. Derry Lory of neuro--- 7/23 no recommended dose adjustments, per Dr. Melynda Ripple likely fast metabolizer, will follow-up with outpatient -pt understands importance of maintaining her sz medication regimen   - 7/24: Endorsed incontinence again, approx. 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 CVA tenderness/flank pain-= CT done- was negative for any issues  17. Constipation- resolved  18. R foot pain and bruising: noted 02/22/23, xray shows no fx or acute findings, suspect contusion  -area appears unchanged compared to reports -continue ice to area and monitor.   -area still tender.  -encouraged appropriate shoe wear when out of bed  - 7/23:  added lidocaine patch to plantar foot.  Sounds like likely overlying plantar fasciitis, discussed treatment with medication and use of tennis ball/golf ball to stretch tissue, possible injections versus release as outpatient    - 7/24: Voltaren gel QID to foot - ongoing   LOS: 7 days A FACE TO FACE EVALUATION WAS PERFORMED  Angelina Sheriff 02/26/2023, 9:04 AM

## 2023-02-26 NOTE — Progress Notes (Signed)
Occupational Therapy Session Note  Patient Details  Name: [name] MRN: [id number] Date of Birth: 09/23/1955  Today's Date: 02/26/2023 OT Individual Time: 0102-7253 OT Individual Time Calculation (min): 58 min    Short Term Goals: Week 1:  OT Short Term Goal 1 (Week 1): LTG=STG 2/2 ELOS  Skilled Therapeutic Interventions/Progress Updates:     Pt received sitting up in chair in room.   Pt had no c/o pain but did speak about the continued discomfort in the plantar/arch of her R foot. Pt requested to get a shower.  Supine to EOB with (S).  Sit>stand with (S).  Functional mobility to bathroom ~67ft with CGA for safety.  Pt able to manipulate RW and side step into shower with CGA.  Upper and LB bathing completed while seated on tub shower bench with (S) and CGA required when  standing to wash perineal area and buttocks.  Functional mobility back to bed for dressing with CGA and RW.  Pt able to complete full body dressing while seated EOB with (S).  Pt continues to require min verbal cuing for attention to stay on task and sequencing.  Functional mobility to sink for self-care with (S) and RW.  Self-care of blow drying hair and oral care completed while seated in wheelchair at sink level with (S). Fucntional mobility ~10ft back to bed with use of RW.  Ice applied to RLE to help with pain.  Pt left lying supine with HOB elevated, call light within reach, bed alarm set and all needs met  Therapy Documentation Precautions:  Precautions Precautions: Fall Precaution Comments: seizure Restrictions Weight Bearing Restrictions: No      Therapy/Group: Individual Therapy  Liam Graham 02/26/2023, 11:13 AM

## 2023-02-26 NOTE — Progress Notes (Unsigned)
Enrolled for [employer] to mail a ZIO XT long term holter monitor to the patients address on file.   Dr. Hilty to read. 

## 2023-02-26 NOTE — Telephone Encounter (Signed)
[facility] was contacted by CIR team to review discharge medication recommendations. Dr. Mayford Knife reviewed chart. Per her review, patient had had transient aflutter during recent admission earlier this month in setting of acute illness and subsequently in NSR. She has remote h/o transient aflutter as well. Per her recommendation, "Due to soft BP would go ahead and stop the short acting cardizem. We will arrange an outpatient 2 week ziopatch to assess for recurrent arrhythmias. She is going to be on anticoagulation for DVT so will be protected if she has recurrent aflutter. " Per CIR notes INR will be monitored by PCP.  Will route this msg to our monitoring team to arrange 2 week non live Zio. CIR team will make patient aware this will be mailed to her. I have also arranged 5 week post-monitor APP f/u. I outlined the appointment as well as FYI to expect monitor on AVS.  CIR team confirms discharging address is [address] [place] [address] .

## 2023-02-26 NOTE — Progress Notes (Signed)
Physical Therapy Session Note  Patient Details  Name: [name] MRN: [id number] Date of Birth: 19-Sep-1955  Today's Date: 02/26/2023 PT Individual Time: 0900-1015 PT Individual Time Calculation (min): 75 min   Short Term Goals: Week 1:  PT Short Term Goal 1 (Week 1): Pt will transfer sit to stand w/ CGA. PT Short Term Goal 2 (Week 1): Pt will amb 150' w/ CGA and LRAD. PT Short Term Goal 3 (Week 1): Pt will negotiate 12 steps w/ 1 rail and CGA.  Skilled Therapeutic Interventions/Progress Updates:     Pt received seated in bed and agrees to therapy. Reports pain in Rt foot. PT provides rest breaks as needed and alerts RN to pain, who provides pain medicine. Pt performs sit to stand with RW and cues for initiation. Pt attempts to ambulate but reports that foot is hurting too bad to walk, so pt transported to gym in [facility]   Pt perseverating on wanting to live independently from daughter. PT provides recommendation that pt is unsafe to live alone, and that therapy staff are recommending consistent supervision following discharge. Pt does not feel that she is unable to live alone.  PT provides various mult-step tasks for pt, involving 2-3 commands and all involving ambulation with RW. PT repeats tasks three times prior to pt attempting. I.e., "Walk up and down steps, out into the hallway, and then back to the [facility]." Or "Walk to the end of the hallway, around the room with the windows, and back to WC." Pt consistently requiring mod cues during task to recall steps. Pt completes mobility tasks without assistance, but cognitively is unable to complete multi-step tasks accurately, indicating she would be unsafe to live alone.   Pt tasked with naming items from the supermarket with each letter of the alphabet while ambulating with RW. PT provides close supervision for safety, but not physical assistance required. Pt has difficulty with cognitive task, however, requiring multiple cues and frequently halting  ambulation to attempt to think of items. Pt ambulates >500' during task. WC transport back to room. Pt left seated in bed with alarm intact and all needs within reach.   Therapy Documentation Precautions:  Precautions Precautions: Fall Precaution Comments: seizure Restrictions Weight Bearing Restrictions: No   Therapy/Group: Individual Therapy  Beau Fanny, PT, DPT 02/26/2023, 4:53 PM

## 2023-02-26 NOTE — Progress Notes (Signed)
Occupational Therapy Session Note  Patient Details  Name: [name] MRN: [id number] Date of Birth: [birth year]  Today's Date: 02/26/2023 OT Individual Time: 4098-1191 OT Individual Time Calculation (min): 58 min    Short Term Goals: Week 1:  OT Short Term Goal 1 (Week 1): LTG=STG 2/2 ELOS  Skilled Therapeutic Interventions/Progress Updates:      Therapy Documentation Precautions:  Precautions Precautions: Fall Precaution Comments: seizure Restrictions Weight Bearing Restrictions: No General:  "Hi there!" Pt supine in bed upon OT arrival, agreeable to OT session.   Pain: No pain reported/noted  ADL: Functional mobility: Pt ambulated community distances with RW with no LOB/SOB reported/noted  Toilet transfer: CGA with RW ambulating  from EOB>toilet ~20 feet Toileting: CGA for pants management in standing, able to complete front hygiene after voiding Grooming: CGA standing at sink to complete hand hygiene after toileting  Exercises: Pt participated in individual BUE and BLE dance exercises in social setting including reaching out of BOS, BUE and BLE ROM in all planes while seated in W/C in order to increase generalized strength/endurance to complete ADLs such as bathing with increased independence. Pt no noted LOB/SOB while participating. Pt required occasional VC to participate.    Therapy/Group: Individual Therapy  Velia Meyer, OTD, OTR/L 02/26/2023, 3:37 PM

## 2023-02-26 NOTE — Progress Notes (Signed)
Physical Therapy Discharge Summary  Patient Details  Name: [name] MRN: [id number] Date of Birth: 1955/10/15  Date of Discharge from PT service:February 27, 2023    Patient has met 9 of 9 long term goals due to improved activity tolerance, improved balance, improved postural control, and improved attention.  Patient to discharge at an ambulatory level Supervision.   Patient's care partner  did not attend family education but has verbalized that she is able  to provide the necessary physical assistance at discharge.  Reasons goals not met: NA  Recommendation:  Patient will benefit from ongoing skilled PT services in home health setting to continue to advance safe functional mobility, address ongoing impairments in balance, ambulation, endurance, and minimize fall risk.  Equipment: No equipment provided  Reasons for discharge: treatment goals met and discharge from hospital  Patient/family agrees with progress made and goals achieved: Yes  PT Discharge Precautions/Restrictions Precautions Precautions: Fall Precaution Comments: Seizure disorder Restrictions Weight Bearing Restrictions: No  Pain Interference Pain Interference Pain Effect on Sleep: 3. Frequently Pain Interference with Therapy Activities: 4. Almost constantly Pain Interference with Day-to-Day Activities: 3. Frequently Vision/Perception  Vision - History Ability to See in Adequate Light: 1 Impaired Perception Perception: Within Functional Limits Praxis Praxis: Intact  Cognition Overall Cognitive Status: History of cognitive impairments - at baseline (Short term memory, perseverative conversation) Arousal/Alertness: Awake/alert Orientation Level: Oriented X4 Attention: Focused Focused Attention: Impaired Focused Attention Impairment: Verbal basic;Functional basic Memory: Impaired (Short term memory at baseline) Memory Impairment: Decreased short term memory Decreased Short Term Memory: Verbal  basic;Functional basic Awareness: Impaired Awareness Impairment: Emergent impairment Problem Solving: Impaired Problem Solving Impairment: Verbal basic;Functional basic Executive Function: Sequencing;Decision Making;Initiating Sequencing: Impaired Sequencing Impairment: Verbal basic;Functional basic Decision Making: Impaired Decision Making Impairment: Verbal basic;Functional basic Initiating: Impaired Initiating Impairment: Verbal basic;Functional basic Safety/Judgment: Impaired (Thinks that she is able to live at home alone) Sensation Sensation Light Touch: Appears Intact Hot/Cold: Appears Intact Proprioception: Appears Intact Coordination Gross Motor Movements are Fluid and Coordinated: No Fine Motor Movements are Fluid and Coordinated: No Coordination and Movement Description: decreased smoothness and accuracy, dysdiadakokinesia, B UE tremors Motor  Motor Motor: Ataxia Motor - Skilled Clinical Observations: coordination deficits but imrpoved from eval  Mobility Bed Mobility Supine to Sit: Independent Sit to Supine: Independent Transfers Transfers: Sit to Stand;Stand to Sit;Stand Pivot Transfers Sit to Stand: Supervision/Verbal cueing Stand to Sit: Supervision/Verbal cueing Stand Pivot Transfers: Supervision/Verbal cueing Stand Pivot Transfer Details: Verbal cues for precautions/safety;Verbal cues for safe use of DME/AE Transfer (Assistive device): Rolling walker Locomotion  Gait Ambulation: Yes Gait Assistance: Supervision/Verbal cueing Gait Distance (Feet): 500 Feet Assistive device: Rolling walker Gait Assistance Details: Verbal cues for gait pattern;Verbal cues for precautions/safety Gait Gait: Yes Gait Pattern: Impaired Gait Pattern:  (slightly ataxic but improved from eval) Gait velocity: reduced Stairs / Additional Locomotion Stairs: Yes Stairs Assistance: Supervision/Verbal cueing Stair Management Technique: Two rails Number of Stairs: 12 Height of  Stairs: 6 Ramp: Supervision/Verbal cueing Curb: Supervision/Verbal cueing Wheelchair Mobility Wheelchair Mobility: No  Trunk/Postural Assessment  Cervical Assessment Cervical Assessment: Within Functional Limits Thoracic Assessment Thoracic Assessment: Within Functional Limits Lumbar Assessment Lumbar Assessment: Within Functional Limits Postural Control Postural Control: Deficits on evaluation (delayed righting reactions but imrpoved from eval)  Balance Balance Balance Assessed: Yes Static Sitting Balance Static Sitting - Balance Support: Bilateral upper extremity supported Static Sitting - Level of Assistance: 6: Modified independent (Device/Increase time) Dynamic Sitting Balance Dynamic Sitting - Balance Support: Bilateral upper extremity supported Dynamic  Sitting - Level of Assistance: 6: Modified independent (Device/Increase time) Dynamic Sitting - Balance Activities: Lateral lean/weight shifting;Forward lean/weight shifting;Reaching for weighted objects;Reaching across midline Static Standing Balance Static Standing - Balance Support: Bilateral upper extremity supported Static Standing - Level of Assistance: 5: Stand by assistance Dynamic Standing Balance Dynamic Standing - Balance Support: Bilateral upper extremity supported Dynamic Standing - Level of Assistance: 5: Stand by assistance Dynamic Standing - Balance Activities: Lateral lean/weight shifting;Forward lean/weight shifting;Reaching for weighted objects;Reaching across midline Extremity Assessment  RUE Assessment RUE Assessment: Within Functional Limits LUE Assessment LUE Assessment: Within Functional Limits RLE Assessment RLE Assessment: Exceptions to [facility] General Strength Comments: grossly 4/5 - decreased coordination and decreased muscular endurance LLE Assessment LLE Assessment: Exceptions to [facility] General Strength Comments: grossly 4/5 - decreased coordination and decreased muscular endurance   Beau Fanny 02/26/2023, 4:57 PM  Philip Aspen, PT, DPT, CBIS 02/27/23 12:21 PM

## 2023-02-27 ENCOUNTER — Other Ambulatory Visit (HOSPITAL_COMMUNITY): Payer: Self-pay

## 2023-02-27 DIAGNOSIS — I4891 Unspecified atrial fibrillation: Secondary | ICD-10-CM | POA: Diagnosis not present

## 2023-02-27 DIAGNOSIS — G40909 Epilepsy, unspecified, not intractable, without status epilepticus: Secondary | ICD-10-CM | POA: Diagnosis not present

## 2023-02-27 DIAGNOSIS — R32 Unspecified urinary incontinence: Secondary | ICD-10-CM | POA: Diagnosis not present

## 2023-02-27 DIAGNOSIS — M79671 Pain in right foot: Secondary | ICD-10-CM | POA: Diagnosis not present

## 2023-02-27 LAB — PROTIME-INR: INR: 2.2 — ABNORMAL HIGH (ref 0.8–1.2)

## 2023-02-27 LAB — BASIC METABOLIC PANEL
Anion gap: 8 (ref 5–15)
BUN: 17 mg/dL (ref 8–23)
CO2: 24 mmol/L (ref 22–32)
Calcium: 8.9 mg/dL (ref 8.9–10.3)
Chloride: 110 mmol/L (ref 98–111)
Creatinine, Ser: 1.06 mg/dL — ABNORMAL HIGH (ref 0.44–1.00)
GFR, Estimated: 58 mL/min — ABNORMAL LOW (ref >60)
Glucose, Bld: 77 mg/dL (ref 70–99)
Potassium: 4.6 mmol/L (ref 3.5–5.1)
Sodium: 142 mmol/L (ref 135–145)

## 2023-02-27 MED ORDER — DICLOFENAC SODIUM 1 % EX GEL
2.0000 g | Freq: Four times a day (QID) | CUTANEOUS | 0 refills | Status: DC | PRN
  Filled 2023-02-27: qty 100, 14d supply, fill #0

## 2023-02-27 MED ORDER — GABAPENTIN 100 MG PO CAPS
100.0000 mg | ORAL_CAPSULE | Freq: Three times a day (TID) | ORAL | 0 refills | Status: DC
  Filled 2023-02-27: qty 90, 30d supply, fill #0

## 2023-02-27 MED ORDER — POTASSIUM CHLORIDE CRYS ER 20 MEQ PO TBCR
20.0000 meq | EXTENDED_RELEASE_TABLET | ORAL | 0 refills | Status: DC
  Filled 2023-02-27: qty 15, 30d supply, fill #0

## 2023-02-27 MED ORDER — OXYBUTYNIN CHLORIDE ER 5 MG PO TB24
5.0000 mg | ORAL_TABLET | Freq: Every day | ORAL | 0 refills | Status: DC
Start: 2023-02-27 — End: 2023-04-16
  Filled 2023-02-27: qty 30, 30d supply, fill #0

## 2023-02-27 MED ORDER — FOLIC ACID 1 MG PO TABS
1.0000 mg | ORAL_TABLET | Freq: Every day | ORAL | 0 refills | Status: DC
  Filled 2023-02-27: qty 30, 30d supply, fill #0

## 2023-02-27 MED ORDER — ACETAMINOPHEN 325 MG PO TABS: 325.0000 mg | ORAL_TABLET | ORAL | Status: DC | PRN

## 2023-02-27 MED ORDER — VITAMIN B-12 1000 MCG PO TABS
2000.0000 ug | ORAL_TABLET | Freq: Every day | ORAL | 0 refills | Status: DC
  Filled 2023-02-27: qty 60, 30d supply, fill #0

## 2023-02-27 MED ORDER — WARFARIN SODIUM 6 MG PO TABS
6.0000 mg | ORAL_TABLET | Freq: Every day | ORAL | Status: AC
  Administered 2023-02-27: 6 mg via ORAL
  Filled 2023-02-27: qty 1

## 2023-02-27 MED ORDER — WARFARIN SODIUM 6 MG PO TABS
6.0000 mg | ORAL_TABLET | Freq: Every day | ORAL | 0 refills | Status: DC
  Filled 2023-02-27: qty 30, 30d supply, fill #0

## 2023-02-27 MED ORDER — TOPIRAMATE 50 MG PO TABS
ORAL_TABLET | ORAL | 0 refills | Status: DC
  Filled 2023-02-27: qty 90, 30d supply, fill #0

## 2023-02-27 NOTE — Progress Notes (Signed)
Occupational Therapy Session Note  Patient Details  Name: [name] MRN: [id number] Date of Birth: [birth year] Session 1: Today's Date: 02/27/2023 OT Individual Time: 4098-1191 OT Individual Time Calculation (min): 75 min  Pt received sitting in bed with legs crossed.  Pt had no c/o pain and even stated that her RLE was feeling better today.  Pt agreeable to OT treatment session and wanted to take a shower.  Pt able to retrieve and make choice about clothing for the day.  Pt required extended time for clothing choice 2/2 perseverative conversation related to family dynamics and her strong desire to live alone when she returns home.  OT/OTS educated pt on the importance of 24 hour supervision 2/2 her seizures and cognitive impairments.  Pt made statements that her daughter "controlled" her life.  Re-direction required throughout session. Sit>stand with (S).  Functional mobility to bathroom ~65ft with (S).  Pt able to manipulate RW and side step into shower with (S).  Upper and LB bathing completed while seated on tub shower bench with (S) and CGA required when  standing to wash perineal area and buttocks.  Functional mobility back to bed for dressing with (S) and RW.  Pt able to complete full body dressing while seated EOB with (S).  Pt continues to require min verbal cuing for attention to stay on task and sequencing.  Functional mobility to sink for self-care with (S) and RW.  Self-care of blow drying hair and oral care completed while seated in wheelchair at sink level with (S). Fucntional mobility ~69ft back to bed with use of RW.  Pt left lying supine with HOB elevated, call light within reach, bed alarm set and all needs met    Short Term Goals: Week 1:  OT Short Term Goal 1 (Week 1): LTG=STG 2/2 ELOS     Skilled Therapeutic Interventions/Progress Updates:      Therapy Documentation Precautions:  Precautions Precautions: Fall Precaution Comments: seizure Restrictions Weight Bearing  Restrictions: No     Therapy/Group: Individual Therapy  Liam Graham 02/27/2023, 7:16 AM

## 2023-02-27 NOTE — Progress Notes (Signed)
ANTICOAGULATION CONSULT NOTE- follow-up  Pharmacy Consult: Lovenox / Warfarin  Indication: DVT  Allergies  Allergen Reactions   Zonegran [Zonisamide] Other (See Comments)    Numbness and tingling over whole body   Chocolate Other (See Comments)    Triggers seizures   Codeine Nausea And Vomiting   Keppra [Levetiracetam] Other (See Comments)    Causes seizures   Lyrica [Pregabalin] Nausea And Vomiting   Olive Oil Rash    Patient Measurements: Height: 5' 7.75" (172.1 cm) Weight: 60.6 kg (133 lb 9.6 oz) IBW/kg (Calculated) : 63.33  Vital Signs: Temp: 98.4 F (36.9 C) (07/26 0443) Temp Source: Oral (07/26 0443) BP: 106/45 (07/26 0743) Pulse Rate: 60 (07/26 0743)  Labs: Recent Labs    02/25/23 0721 02/26/23 0804 02/27/23 0639  HGB  --  11.0*  --   HCT  --  35.2*  --   PLT  --  299  --   LABPROT 24.2* 24.0* 24.5*  INR 2.2* 2.1* 2.2*    Estimated Creatinine Clearance: 67.1 mL/min (by C-G formula based on SCr of 0.78 mg/dL).   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  Now on Warfarin wit enoxaparin bridge. CBC stable, platelets 250. No issues with bleeding reported. INR therapeutic and  trending up now at 2.2. Continue warfarin, stopped enoxaparin bridge 7/23 due to history of bleeding complications. Average trend is ~6.25mg /day over past week. Consider 6mg /daily for maintenance dose.  7/26 AM update: INR 2.2 No bleeding noted by nursing  Goal of Therapy:  INR 2 to 3 Monitor platelets by anticoagulation protocol: Yes   Plan:  Give warfarin 6 mg PO daily Home recs: warfarin 6 mg po qday w/ INR at day 3 post discharge and follow-up with PCP/Coumadin clinic Check INR  daily while on warfarin Continue to monitor H&H and platelets  Thank you for allowing pharmacy to be a part of this patient's care.  Greta Doom BS, PharmD, BCPS Clinical Pharmacist 02/27/2023 8:19 AM  Contact: [phone] after 3 PM  "Be curious, not judgmental..." -Debbora Dus

## 2023-02-27 NOTE — Progress Notes (Signed)
PROGRESS NOTE   Subjective/Complaints:  No events overnight.  No acute complaints No questions, concerns regarding discharge.  Did discuss with patient discontinuation of rate controlling medication and outpatient follow-up for Zio patch with cardiology.  She is agreeable with this plan. Ongoing pain in right foot, but does feel this is improving.  ROS:  + R foot pain-ongoing + Headaches-resolved + incontinence/seizures-resolved Patient denies fever, rash, sore throat, blurred vision, dizziness, nausea, vomiting, diarrhea, cough, shortness of breath or chest pain,  headache, or mood change.     Objective:   No results found. Recent Labs    02/26/23 0804  WBC 4.2  HGB 11.0*  HCT 35.2*  PLT 299   No results for input(s): "NA", "K", "CL", "CO2", "GLUCOSE", "BUN", "CREATININE", "CALCIUM" in the last 72 hours.   Intake/Output Summary (Last 24 hours) at 02/27/2023 0906 Last data filed at 02/27/2023 0758 Gross per 24 hour  Intake 1428 ml  Output --  Net 1428 ml         Physical Exam: Vital Signs Blood pressure (!) 106/45, pulse 60, temperature 98.4 F (36.9 C), temperature source Oral, resp. rate 16, height 5' 7.75" (1.721 m), weight 60.6 kg, SpO2 98%.   PE: Constitution: Appropriate appearance for age. No apparent distress  Resp: CTAB. No respiratory distress. No accessory muscle usage. on RA Cardio: Well perfused appearance. No peripheral edema. Abdomen: Nondistended. Nontender.   Psych: Appropriate mood and affect. Skin: c/d/I.   Neurologic Exam:    AAOx4. No apparent cognitive deficits. +poor insight Sensory exam: revealed normal sensation in all dermatomal regions in bilateral upper extremities and bilateral lower extremities Motor exam: strength 5/5 throughout bilateral upper extremities and bilateral lower extremities  Coordination: No apparent tremors. Fine motor coordination was normal.     MSK: R  foot with bruising from arch up to plantar aspect of great toe area, remains exquisitely TTP from the posterior calcaneus along the arch and 1st metatarsal, no crepitus or deformity-improved toelrance      Assessment/Plan: 1. Functional deficits which require 3+ hours per day of interdisciplinary therapy in a comprehensive inpatient rehab setting. Physiatrist is providing close team supervision and 24 hour management of active medical problems listed below. Physiatrist and rehab team continue to assess barriers to discharge/monitor patient progress toward functional and medical goals  Care Tool:  Bathing    Body parts bathed by patient: Right arm, Left arm, Chest, Abdomen, Front perineal area, Buttocks, Left lower leg, Right lower leg, Right upper leg, Left upper leg, Face         Bathing assist Assist Level: Minimal Assistance - Patient > 75%     Upper Body Dressing/Undressing Upper body dressing   What is the patient wearing?: Pull over shirt    Upper body assist Assist Level: Minimal Assistance - Patient > 75%    Lower Body Dressing/Undressing Lower body dressing      What is the patient wearing?: Underwear/pull up, Pants     Lower body assist Assist for lower body dressing: Minimal Assistance - Patient > 75%     Toileting Toileting    Toileting assist Assist for toileting: Minimal Assistance - Patient > 75%  Transfers Chair/bed transfer  Transfers assist     Chair/bed transfer assist level: Minimal Assistance - Patient > 75%     Locomotion Ambulation   Ambulation assist      Assist level: Minimal Assistance - Patient > 75% Assistive device: Walker-rolling Max distance: 155   Walk 10 feet activity   Assist     Assist level: Minimal Assistance - Patient > 75% Assistive device: Walker-rolling   Walk 50 feet activity   Assist    Assist level: Minimal Assistance - Patient > 75% Assistive device: Walker-rolling    Walk 150 feet  activity   Assist    Assist level: Minimal Assistance - Patient > 75% Assistive device: Walker-rolling    Walk 10 feet on uneven surface  activity   Assist           Wheelchair     Assist Is the patient using a wheelchair?: Yes Type of Wheelchair: Manual    Wheelchair assist level: Minimal Assistance - Patient > 75% Max wheelchair distance: 150    Wheelchair 50 feet with 2 turns activity    Assist        Assist Level: Minimal Assistance - Patient > 75%   Wheelchair 150 feet activity     Assist      Assist Level: Minimal Assistance - Patient > 75%   Blood pressure (!) 106/45, pulse 60, temperature 98.4 F (36.9 C), temperature source Oral, resp. rate 16, height 5' 7.75" (1.721 m), weight 60.6 kg, SpO2 98%.  Medical Problem List and Plan: 1. Functional deficits secondary to seizures- ran out meds due to insurance issues with more cognitive deficits and increased weakness above baseline             -patient may  shower             -ELOS/Goals: 2-2.5 weeks- min A to supervision - 7/27             -Continue CIR therapies including PT, OT  - stable for discharge 7/27   2.  Antithrombotics: -DVT/anticoagulation:  Pharmaceutical: Cannot have Eliquis or Xarelto due to interaction with Phenytoin- will change to tx dose lovenox 60mg  q12h and transition to Coumadin. -PCP to do Coumadin monitoring as outpatient   - 7-26 dietary consult for new patient on Coumadin for education on foods to avoid             -antiplatelet therapy: none   3. Pain Management: Tylenol as needed (no tramadol)             -Lidoderm as needed   4. Mood/Behavior/Sleep: LCSW to evaluate and provide emotional support (no trazodone)             -antipsychotic agents: n/a  -7/20-22/24 sleeping fairly well, monitor   5. Neuropsych/cognition: This patient is capable of making decisions on her own behalf.   6. Skin/Wound Care: Routine skin care checks    - No apparent lesions in  mouth, tongue s/p ?seizure  7. Fluids/Electrolytes/Nutrition: Routine Is and Os and follow-up chemistries             -hypoalbuminemia/hypoproteinemia>>start Juven             -suboptimal B12 level (<400) continue vitamin B12 daily    7/22 potassium 3.4---supplement, other labs ok  8: Paroxysmal atrial fib: Eliquis started 7/18-> Coumadin; prior IVC filter placement             -continue diltiazem 30 mg BID  -7/20-21/24  HR well controlled  -7-23: Mildly hypotensive this a.m., placed holding parameters on diltiazem SBP less than 90, DBP less than 60, heart rate less than 50.   - 7/ 25: Hypotensive and slightly bradycardic, have needed to hold last 5 doses of Cardizem. Will get Cardiology input on DC med recs today.  - 7/26: Appreciate cardiology input; plan for DC diltiazem; they will arrange an outpatient 2 week ziopatch to assess for recurrent arrhythmias and 5 week follow up    9: RUE DVT (right axillary vein, due to PICC): Eliquis started 7/18- stopped due to phenytoin- starting lovenox and transition to Coumadin per pharmacy   10: Seizure disorder/hx AVM rupture and s/p crani 1989 admitted with breakthrough seizures; residual left-sided weakness and left visual field cut             -continue Vimpat 200 mg BID -continue Dilantin 130 mg daily and 100 mg q PM - family reports issues with generic, bringing in home med             -continue Topamax 50 mg BID - 7/19: Overnight episode of incontinence, pt concerned for seizure. Discussed with Dr. Melynda Ripple; increase Topomax to 50 mg/100 mg  -02/23/23 phenytoin level low at 2.7 on 02/20/23, Dr. Shearon Stalls spoke with Dr. Derry Lory of neuro--- 7/23 no recommended dose adjustments, per Dr. Melynda Ripple likely fast metabolizer, will follow-up with outpatient -pt understands importance of maintaining her sz medication regimen   - 7/24: Endorsed incontinence again, approx. 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 CVA tenderness/flank pain-= CT done- was negative for any issues  17. Constipation- resolved  18. R foot pain and bruising: noted 02/22/23, xray shows no fx or acute findings, suspect contusion-improving, likely due to seizure  -area appears unchanged compared to reports -continue ice to area and monitor.   -area still tender.  -encouraged appropriate shoe wear when out of bed  - 7/23: added lidocaine patch to plantar foot.  Sounds like likely overlying plantar fasciitis, discussed treatment with medication and use of tennis ball/golf ball to stretch tissue, possible injections versus release as outpatient   - 7/24: Voltaren gel QID to foot - ongoing   LOS: 8 days A FACE TO FACE EVALUATION WAS PERFORMED  [name] 02/27/2023, 9:06 AM

## 2023-02-27 NOTE — Progress Notes (Signed)
I went over the AVS with the patient and left it with her 3-ring binder and I called patient's daughter, Melisse Flecker and verbally went over the AVS with her emphasizing Coumadin use and follow-up as well as Zio patch instructions. Her meds will come from [facility].

## 2023-02-27 NOTE — Progress Notes (Addendum)
Physical Therapy Session Note  Patient Details  Name: [name] MRN: [id number] Date of Birth: August 12, 1955  Today's Date: 02/27/2023 PT Individual Time: 1100-1200 PT Individual Time Calculation (min): 60 min   Short Term Goals: Week 1:  PT Short Term Goal 1 (Week 1): Pt will transfer sit to stand w/ CGA. PT Short Term Goal 2 (Week 1): Pt will amb 150' w/ CGA and LRAD. PT Short Term Goal 3 (Week 1): Pt will negotiate 12 steps w/ 1 rail and CGA.  Skilled Therapeutic Interventions/Progress Updates:    Session focused on grad day activities and preparation for upcoming d/c tomorrow. Pt denies any concerns and states she feels prepared for home. Reviewed recommendations for use of RW, fall risk, and overall return to activities. Pt verbalized understanding but does continue to demonstrate decreased insight overall into deficits and safety awareness with mobility. D/c assessment completed (see d/c summary for further details)  Performed bed mobility in session independently and donned socks and shoes to prepare for session. Functional transfers with RW with overall supervision, some cues needed for safe RW positioning especially in home environment setting. Administered Berg Balance test for fall risk assessment and reviewed results with patient. Demonstrated 5 point improvement in since last test administered. See results below.   Issued handout and reviewed the following exercises with patient for HEP to focus on continued LE strengthening and balance to improve overall mobility and decrease fall risk. Pt required min to mod cues throughout for technique, especially when increasingly complicated (stepping sideways and backwards) and recommended to do these with a caregiver present.   Access Code: [email or web address] URL: [email or web address] Date: 02/27/2023 Prepared by: Jill Side  Exercises - Heel Raises with Counter Support  - 1 x daily - 7 x weekly - 3 sets - 10 reps - Toe Raises with  Counter Support  - 1 x daily - 7 x weekly - 3 sets - 10 reps - Mini Squat with Counter Support  - 1 x daily - 7 x weekly - 3 sets - 10 reps - Side Stepping with Counter Support  - 1 x daily - 7 x weekly - 3 sets - 10 reps - Backward Walking with Counter Support  - 1 x daily - 7 x weekly - 3 sets - 10 reps   Engaged in NMR for balance retraining for gait without AD in hallway and crowded environment with dual task activity. Pt requires intermittent min assist for mild LOB and decreased safety and body awareness becoming too close to the wall and obstacles on the L side during dual task challenges.   End of session performed close supervision gait in and out of bathroom, decreased safety awareness with positioning of the RW for toileting needs and completed all hygiene and clothing management with supervision for balance only. Returned to bed and administered ice pack for R foot pain.   Therapy Documentation Precautions:  Precautions Precautions: Fall Precaution Comments: Seizure disorder Restrictions Weight Bearing Restrictions: No    Pain: Reports pain in R foot - RN notified for pain medication and applied ice pack at end of session. Rest breaks and activities modified during session as needed.      Balance: Balance Balance Assessed: Yes Berg Balance Test Sit to Stand: Able to stand  independently using hands Standing Unsupported: Able to stand 2 minutes with supervision Sitting with Back Unsupported but Feet Supported on Floor or Stool: Able to sit safely and securely 2 minutes Stand to Sit: Controls descent by  using hands Transfers: Needs one person to assist Standing Unsupported with Eyes Closed: Able to stand 10 seconds with supervision Standing Ubsupported with Feet Together: Needs help to attain position and unable to hold for 15 seconds From Standing, Reach Forward with Outstretched Arm: Reaches forward but needs supervision From Standing Position, Pick up Object from  Floor: Able to pick up shoe, needs supervision From Standing Position, Turn to Look Behind Over each Shoulder: Turn sideways only but maintains balance Turn 360 Degrees: Needs close supervision or verbal cueing Standing Unsupported, Alternately Place Feet on Step/Stool: Needs assistance to keep from falling or unable to try Standing Unsupported, One Foot in Front: Loses balance while stepping or standing Standing on One Leg: Tries to lift leg/unable to hold 3 seconds but remains standing independently Total Score: 25   Therapy/Group: Individual Therapy  Karolee Stamps Darrol Poke, PT, DPT, CBIS  02/27/2023, 12:10 PM

## 2023-02-27 NOTE — Progress Notes (Signed)
Inpatient Rehabilitation Discharge Medication Review by a Pharmacist  A complete drug regimen review was completed for this patient to identify any potential clinically significant medication issues.  High Risk Drug Classes Is patient taking? Indication by Medication  Antipsychotic No   Anticoagulant Yes Warfarin- DVT (failed apixaban therapy)  Antibiotic No   Opioid No   Antiplatelet No   Hypoglycemics/insulin No   Vasoactive Medication No   Chemotherapy No   Other Yes Dilantin, topamax, vimpat, gabapentin- seizure ppx Ativan- seizure breakthrough Oxybutynin- urinary incontinence     Type of Medication Issue Identified Description of Issue Recommendation(s)  Drug Interaction(s) (clinically significant)     Duplicate Therapy     Allergy     No Medication Administration End Date     Incorrect Dose     Additional Drug Therapy Needed     Significant med changes from prior encounter (inform family/care partners about these prior to discharge). apixaban Communicate to patient and family that this med has been discontinued and replaced with warfarin  Other       Clinically significant medication issues were identified that warrant physician communication and completion of prescribed/recommended actions by midnight of the next day:  No   Time spent performing this drug regimen review (minutes):  30   Brita Jurgensen BS, PharmD, BCPS Clinical Pharmacist 02/27/2023 12:51 PM  Contact: [phone] after 3 PM  "Be curious, not judgmental..." -Debbora Dus

## 2023-02-27 NOTE — Progress Notes (Signed)
Occupational Therapy Discharge Summary  Patient Details  Name: [name] MRN: [id number] Date of Birth: 01/27/1956  Date of Discharge from OT service:February 27, 2023  Today's Date: 02/27/2023 OT Individual Time: 1400-1500 OT Individual Time Calculation (min): 60 min     Patient has met 9 of 9 long term goals due to improved activity tolerance, improved balance, improved attention, improved awareness, and improved coordination.  Patient to discharge at overall Supervision level.  Patient's care partner is independent to provide the necessary physical and cognitive assistance at discharge. Pt continues to require min verbal cues for sequencing, attention, safety awareness and problem solving.  Pt is continuing to use RW for functional mobility 2/2 safety concerns.  Pt plan is to d/c Saturday July 27th and is returning home with daughter. Daughter was unable to complete family education but supervision hand outs provided to pt and placed in d/c book for review with daughter when she returns home.    OT Treatment interventions/session Pt received sitting in bed on her phone.  Pt had no c/o pain and was receptive to OT treatment session.  Pt able to get EOB with Mod I.  Donning of shoes completed with (S) at EOB.  Functional mobility to dayroom ~275ft with (S) and RW.  Pt completed visual scanning activity  with BITS while standing completed with (S) Extended time needed for visual scanning ~7 minutes with one error.  Memory felxibility activity completed while standing at BITS with (S).  Pt did display difficulty with memory and required min verbal cues for completion.  Rest breaks provided between activities.  Pt completed 10 minutes on Nustep at a workload of 5 for increased endurance, and LB strengthening for increased independence with ADLs at d/c.  Pt required extended rest break after completion of Nustep 2/2 fatigue.  Functional mobility back to room ~222ft with (S) and RW.  Pathfinding  implemented with fucntional mobility back to room.  Pt required Mod A to find her way back to room and displayed decreased safety awareness with DME.  Pt had loss of balance that required Mod A to correct 2/2 reduced attention and improper DME use.  Provided frank education on proper DME use and fall risk prevention.  Pt had another loss of balance when she reached the door of her room running into equipment that was sitting outside of her door.  Supervision level print outs provided to pt and reviewed.  Pt expressed verbal understanding.  Pt left sitting in bed with bed alarm set, call light within reach and all needs met.    Recommendation:  Patient will benefit from ongoing skilled OT services in home health setting to continue to advance functional skills in the area of BADL.  Equipment: No equipment provided  Reasons for discharge: treatment goals met and discharge from hospital  Patient/family agrees with progress made and goals achieved: Yes  OT Discharge Precautions/Restrictions  Precautions Precautions: Fall Precaution Comments: Seizure disorder Restrictions Weight Bearing Restrictions: No General   Vital Signs Therapy Vitals Pulse Rate: 60 BP: (!) 106/45 Pain Pain Assessment Pain Scale: 0-10 Pain Score: 0-No pain Faces Pain Scale: Hurts little more Pain Type: Chronic pain Pain Location: Foot Pain Orientation: Right Pain Descriptors / Indicators: Aching Pain Intervention(s): Medication (See eMAR) ADL ADL Eating: Modified independent Where Assessed-Eating: Edge of bed Grooming: Modified independent Where Assessed-Grooming: Standing at sink Upper Body Bathing: Supervision/safety Where Assessed-Upper Body Bathing: Shower Lower Body Bathing: Supervision/safety Where Assessed-Lower Body Bathing: Shower Upper Body Dressing: Modified  independent (Device) Where Assessed-Upper Body Dressing: Edge of bed Lower Body Dressing: Modified independent Where Assessed-Lower  Body Dressing: Edge of bed Toileting: Supervision/safety Where Assessed-Toileting: Teacher, adult education: Close supervision Toilet Transfer Method: Stand pivot Tub/Shower Transfer: Close supervison Web designer Method: Engineer, technical sales: Insurance underwriter: Close supervision Film/video editor Method: Warden/ranger: Emergency planning/management officer Vision Baseline Vision/History: 1 Wears glasses Patient Visual Report: Other (comment) (no peripheral vision at baseline) Perception  Perception: Within Functional Limits Praxis Praxis: Intact Cognition Cognition Overall Cognitive Status: History of cognitive impairments - at baseline (Short term memory, perseverative conversation) Arousal/Alertness: Awake/alert Orientation Level: Person;Place;Situation Person: Oriented Place: Oriented Situation: Oriented Memory: Impaired (Short term memory at baseline) Memory Impairment: Decreased short term memory Decreased Short Term Memory: Verbal basic;Functional basic Attention: Focused Focused Attention: Impaired Focused Attention Impairment: Verbal basic;Functional basic Awareness: Impaired Awareness Impairment: Emergent impairment Problem Solving: Impaired Problem Solving Impairment: Verbal basic;Functional basic Executive Function: Sequencing;Decision Making;Initiating Sequencing: Impaired Sequencing Impairment: Verbal basic;Functional basic Decision Making: Impaired Decision Making Impairment: Verbal basic;Functional basic Initiating: Impaired Initiating Impairment: Verbal basic;Functional basic Safety/Judgment: Impaired (Thinks that she is able to live at home alone) Brief Interview for Mental Status (BIMS) Repetition of Three Words (First Attempt): 3 Temporal Orientation: Year: Correct Temporal Orientation: Month: Accurate within 5 days Temporal Orientation: Day: Correct Recall: "Sock": Yes, no cue required Recall:  "Blue": Yes, no cue required Recall: "Bed": No, could not recall BIMS Summary Score: 13 Sensation Sensation Light Touch: Appears Intact Hot/Cold: Appears Intact Proprioception: Appears Intact Coordination Gross Motor Movements are Fluid and Coordinated: No Fine Motor Movements are Fluid and Coordinated: No Coordination and Movement Description: decreased smoothness and accuracy, dysdiadakokinesia, B UE tremors Motor  Motor Motor: Ataxia Motor - Skilled Clinical Observations: coordination deficits but imrpoved from eval Mobility  Bed Mobility Bed Mobility: Rolling Right;Rolling Left;Right Sidelying to Sit;Sit to Supine Rolling Right: Independent with assistive device Rolling Left: Independent with assistive device Right Sidelying to Sit: Independent with assistive device Supine to Sit: Independent with assistive device Sit to Supine: Independent with assistive device Transfers Sit to Stand: Independent with assistive device Stand to Sit: Independent with assistive device  Trunk/Postural Assessment  Cervical Assessment Cervical Assessment: Within Functional Limits Thoracic Assessment Thoracic Assessment: Within Functional Limits Postural Control Postural Control: Deficits on evaluation Righting Reactions: Delayed at evaluation but has improved since  Balance Balance Balance Assessed: Yes Static Sitting Balance Static Sitting - Balance Support: Bilateral upper extremity supported Static Sitting - Level of Assistance: 6: Modified independent (Device/Increase time) Dynamic Sitting Balance Dynamic Sitting - Balance Support: Bilateral upper extremity supported Dynamic Sitting - Level of Assistance: 6: Modified independent (Device/Increase time) Dynamic Sitting - Balance Activities: Lateral lean/weight shifting;Forward lean/weight shifting;Reaching for weighted objects;Reaching across midline Static Standing Balance Static Standing - Balance Support: Bilateral upper extremity  supported Static Standing - Level of Assistance: 5: Stand by assistance Dynamic Standing Balance Dynamic Standing - Balance Support: Bilateral upper extremity supported Dynamic Standing - Level of Assistance: 5: Stand by assistance Dynamic Standing - Balance Activities: Lateral lean/weight shifting;Forward lean/weight shifting;Reaching for weighted objects;Reaching across midline Extremity/Trunk Assessment RUE Assessment RUE Assessment: Within Functional Limits LUE Assessment LUE Assessment: Within Functional Limits   Liam Graham 02/27/2023, 11:17 AM

## 2023-02-28 LAB — PROTIME-INR
INR: 2.2 — ABNORMAL HIGH (ref 0.8–1.2)
Prothrombin Time: 24.2 seconds — ABNORMAL HIGH (ref 11.4–15.2)

## 2023-02-28 NOTE — Progress Notes (Signed)
ANTICOAGULATION CONSULT NOTE- follow-up  Pharmacy Consult: Lovenox / Warfarin  Indication: DVT  Allergies  Allergen Reactions   Zonegran [Zonisamide] Other (See Comments)    Numbness and tingling over whole body   Chocolate Other (See Comments)    Triggers seizures   Codeine Nausea And Vomiting   Keppra [Levetiracetam] Other (See Comments)    Causes seizures   Lyrica [Pregabalin] Nausea And Vomiting   Olive Oil Rash    Patient Measurements: Height: 5' 7.75" (172.1 cm) Weight: 60.6 kg (133 lb 9.6 oz) IBW/kg (Calculated) : 63.33  Vital Signs: Temp: 97.5 F (36.4 C) (07/27 0424) Temp Source: Oral (07/27 0424) BP: 106/52 (07/27 0424) Pulse Rate: 55 (07/27 0424)  Labs: Recent Labs    02/26/23 0804 02/27/23 0865 02/27/23 1649 02/28/23 0621 02/28/23 1040  HGB 11.0*  --   --   --   --   HCT 35.2*  --   --   --   --   PLT 299  --   --   --   --   LABPROT 24.0* 24.5*  --  >90.0* 24.2*  INR 2.1* 2.2*  --  >10.0* 2.2*  CREATININE  --   --  1.06*  --   --     Estimated Creatinine Clearance: 50.6 mL/min (A) (by C-G formula based on SCr of 1.06 mg/dL (H)).   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  INR remains therapeutic at 2.2. No s/sx of bleeding per RN.  Goal of Therapy:  INR 2 to 3 Monitor platelets by anticoagulation protocol: Yes   Plan:  Patient being discharged shortly, continue with home recs Home recs: warfarin 6 mg po qday w/ INR at day 3 post discharge and follow-up with PCP/Coumadin clinic  Thank you for allowing pharmacy to be a part of this patient's care.  Nicole Kindred, PharmD PGY1 Pharmacy Resident 02/28/2023 11:24 AM

## 2023-02-28 NOTE — Plan of Care (Signed)
  Problem: Consults Goal: RH GENERAL PATIENT EDUCATION Description: See Patient Education module for education specifics. Outcome: Progressing   Problem: RH BOWEL ELIMINATION Goal: RH STG MANAGE BOWEL WITH ASSISTANCE Description: STG Manage Bowel with min Assistance. Outcome: Progressing   Problem: RH BOWEL ELIMINATION Goal: RH STG MANAGE BOWEL W/MEDICATION W/ASSISTANCE Description: STG Manage Bowel with Medication with min Assistance. Outcome: Progressing   Problem: RH SKIN INTEGRITY Goal: RH STG SKIN FREE OF INFECTION/BREAKDOWN Description: Skin will remain intact and be free of infection/breakdown with min assist  Outcome: Progressing   Problem: RH SAFETY Goal: RH STG ADHERE TO SAFETY PRECAUTIONS W/ASSISTANCE/DEVICE Description: STG Adhere to Safety Precautions With cueing Assistance/Device. Outcome: Progressing   Problem: RH SAFETY Goal: RH STG DECREASED RISK OF FALL WITH ASSISTANCE Description: STG Decreased Risk of Fall With min Assistance. Outcome: Progressing   Problem: Consults Goal: RH GENERAL PATIENT EDUCATION Description: See Patient Education module for education specifics. Outcome: Progressing

## 2023-02-28 NOTE — Progress Notes (Signed)
PROGRESS NOTE   Subjective/Complaints:  Pt doing well this morning, ready to go home. Slept poorly because of her R foot pain but states overall it seems to be improving, states it's managable. LBM was yesterday. Urinating fine. Denies any other complaints or concerns today.  Unfortunately prior to d/c pt's INR returned elevated, recheck ordered and was 2.2-- so lab error for prior INR reading.   ROS:  + R foot pain-ongoing + Headaches-resolved + incontinence/seizures-resolved Patient denies fever, rash, sore throat, blurred vision, dizziness, nausea, vomiting, diarrhea, cough, shortness of breath or chest pain,  headache, or mood change.     Objective:   No results found. Recent Labs    02/26/23 0804  WBC 4.2  HGB 11.0*  HCT 35.2*  PLT 299   Recent Labs    02/27/23 1649  NA 142  K 4.6  CL 110  CO2 24  GLUCOSE 77  BUN 17  CREATININE 1.06*  CALCIUM 8.9     Intake/Output Summary (Last 24 hours) at 02/28/2023 1125 Last data filed at 02/28/2023 0700 Gross per 24 hour  Intake 971 ml  Output --  Net 971 ml         Physical Exam: Vital Signs Blood pressure (!) 106/52, pulse (!) 55, temperature (!) 97.5 F (36.4 C), temperature source Oral, resp. rate 18, height 5' 7.75" (1.721 m), weight 60.6 kg, SpO2 99%.   PE: Constitution: Appropriate appearance for age. No apparent distress  Resp: CTAB. No respiratory distress. No accessory muscle usage. on RA Cardio: Well perfused appearance. No peripheral edema. Slightly bradycardic.  Abdomen: Nondistended. Nontender.  +BS throughout.  Psych: Appropriate mood and affect. Skin: c/d/I.   PRIOR EXAMS: Neurologic Exam:    AAOx4. No apparent cognitive deficits. +poor insight Sensory exam: revealed normal sensation in all dermatomal regions in bilateral upper extremities and bilateral lower extremities Motor exam: strength 5/5 throughout bilateral upper extremities  and bilateral lower extremities  Coordination: No apparent tremors. Fine motor coordination was normal.     MSK: R foot with bruising from arch up to plantar aspect of great toe area, remains exquisitely TTP from the posterior calcaneus along the arch and 1st metatarsal, no crepitus or deformity-improved toelrance      Assessment/Plan: 1. Functional deficits which require 3+ hours per day of interdisciplinary therapy in a comprehensive inpatient rehab setting. Physiatrist is providing close team supervision and 24 hour management of active medical problems listed below. Physiatrist and rehab team continue to assess barriers to discharge/monitor patient progress toward functional and medical goals  Care Tool:  Bathing    Body parts bathed by patient: Right arm, Left arm, Chest, Abdomen, Front perineal area, Buttocks, Right upper leg, Left upper leg, Right lower leg, Left lower leg, Face         Bathing assist Assist Level: Supervision/Verbal cueing     Upper Body Dressing/Undressing Upper body dressing   What is the patient wearing?: Pull over shirt    Upper body assist Assist Level: Supervision/Verbal cueing    Lower Body Dressing/Undressing Lower body dressing      What is the patient wearing?: Underwear/pull up, Pants     Lower body assist  Assist for lower body dressing: Supervision/Verbal cueing     Toileting Toileting    Toileting assist Assist for toileting: Supervision/Verbal cueing     Transfers Chair/bed transfer  Transfers assist     Chair/bed transfer assist level: Supervision/Verbal cueing     Locomotion Ambulation   Ambulation assist      Assist level: Supervision/Verbal cueing Assistive device: Walker-rolling Max distance: 150'   Walk 10 feet activity   Assist     Assist level: Supervision/Verbal cueing Assistive device: Walker-rolling   Walk 50 feet activity   Assist    Assist level: Supervision/Verbal cueing Assistive  device: Walker-rolling    Walk 150 feet activity   Assist    Assist level: Supervision/Verbal cueing Assistive device: Walker-rolling    Walk 10 feet on uneven surface  activity   Assist     Assist level: Supervision/Verbal cueing Assistive device: Walker-rolling   Wheelchair     Assist Is the patient using a wheelchair?: No Type of Wheelchair: Manual    Wheelchair assist level: Minimal Assistance - Patient > 75% Max wheelchair distance: 150    Wheelchair 50 feet with 2 turns activity    Assist        Assist Level: Minimal Assistance - Patient > 75%   Wheelchair 150 feet activity     Assist      Assist Level: Minimal Assistance - Patient > 75%   Blood pressure (!) 106/52, pulse (!) 55, temperature (!) 97.5 F (36.4 C), temperature source Oral, resp. rate 18, height 5' 7.75" (1.721 m), weight 60.6 kg, SpO2 99%.  Medical Problem List and Plan: 1. Functional deficits secondary to seizures- ran out meds due to insurance issues with more cognitive deficits and increased weakness above baseline             -patient may  shower             -ELOS/Goals: 2-2.5 weeks- min A to supervision - 7/27             -Continue CIR therapies including PT, OT  - stable for discharge 7/27   2.  Antithrombotics: -DVT/anticoagulation:  Pharmaceutical: Cannot have Eliquis or Xarelto due to interaction with Phenytoin- will change to tx dose lovenox 60mg  q12h and transition to Coumadin. -PCP to do Coumadin monitoring as outpatient - 7-26 dietary consult for new patient on Coumadin for education on foods to avoid -02/28/23 INR >10 this morning, rechecked and was 2.2, likely lab error for this morning. Continue with current coumadin regimen             -antiplatelet therapy: none   3. Pain Management: Tylenol as needed (no tramadol)             -Lidoderm as needed   4. Mood/Behavior/Sleep: LCSW to evaluate and provide emotional support (no trazodone)              -antipsychotic agents: n/a  -7/20-22/24 sleeping fairly well, monitor   5. Neuropsych/cognition: This patient is capable of making decisions on her own behalf.   6. Skin/Wound Care: Routine skin care checks    - No apparent lesions in mouth, tongue s/p ?seizure  7. Fluids/Electrolytes/Nutrition: Routine Is and Os and follow-up chemistries             -hypoalbuminemia/hypoproteinemia>>start Juven             -suboptimal B12 level (<400) continue vitamin B12 daily    7/22 potassium 3.4---supplement, other labs ok  8:  Paroxysmal atrial fib: Eliquis started 7/18-> Coumadin; prior IVC filter placement             -continue diltiazem 30 mg BID  -7/20-21/24 HR well controlled  -7-23: Mildly hypotensive this a.m., placed holding parameters on diltiazem SBP less than 90, DBP less than 60, heart rate less than 50.   - 7/ 25: Hypotensive and slightly bradycardic, have needed to hold last 5 doses of Cardizem. Will get Cardiology input on DC med recs today.  - 7/26: Appreciate cardiology input; plan for DC diltiazem; they will arrange an outpatient 2 week ziopatch to assess for recurrent arrhythmias and 5 week follow up    9: RUE DVT (right axillary vein, due to PICC): Eliquis started 7/18- stopped due to phenytoin- starting lovenox and transition to Coumadin per pharmacy   10: Seizure disorder/hx AVM rupture and s/p crani 1989 admitted with breakthrough seizures; residual left-sided weakness and left visual field cut             -continue Vimpat 200 mg BID -continue Dilantin 130 mg daily and 100 mg q PM - family reports issues with generic, bringing in home med             -continue Topamax 50 mg BID - 7/19: Overnight episode of incontinence, pt concerned for seizure. Discussed with Dr. Melynda Ripple; increase Topomax to 50 mg/100 mg  -02/23/23 phenytoin level low at 2.7 on 02/20/23, Dr. Shearon Stalls spoke with Dr. Derry Lory of neuro--- 7/23 no recommended dose adjustments, per Dr. Melynda Ripple likely fast  metabolizer, will follow-up with outpatient -pt understands importance of maintaining her sz medication regimen   - 7/24: Endorsed incontinence again, approx. 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 CVA tenderness/flank pain-= CT done- was negative for any issues  17. Constipation- resolved  18. R foot pain and bruising: noted 02/22/23, xray shows no fx or acute findings, suspect contusion-improving, likely due to seizure  -area appears unchanged compared to reports -continue ice to area and monitor.   -area still tender.  -encouraged appropriate shoe wear when out of bed  - 7/23: added lidocaine patch to plantar foot.  Sounds like likely overlying plantar fasciitis, discussed treatment with medication and use of tennis ball/golf ball to stretch tissue, possible injections versus release as outpatient   - 7/24: Voltaren gel QID to foot - ongoing   LOS: 9 days A FACE TO FACE EVALUATION WAS PERFORMED  [address] 02/28/2023, 11:25 AM

## 2023-03-02 ENCOUNTER — Telehealth: Payer: Self-pay | Admitting: Neurology

## 2023-03-02 ENCOUNTER — Telehealth: Payer: Self-pay | Admitting: Radiation Oncology

## 2023-03-02 DIAGNOSIS — G9389 Other specified disorders of brain: Secondary | ICD-10-CM | POA: Diagnosis not present

## 2023-03-02 DIAGNOSIS — M48061 Spinal stenosis, lumbar region without neurogenic claudication: Secondary | ICD-10-CM | POA: Diagnosis not present

## 2023-03-02 DIAGNOSIS — Z87891 Personal history of nicotine dependence: Secondary | ICD-10-CM | POA: Diagnosis not present

## 2023-03-02 DIAGNOSIS — N390 Urinary tract infection, site not specified: Secondary | ICD-10-CM | POA: Diagnosis not present

## 2023-03-02 DIAGNOSIS — Z9181 History of falling: Secondary | ICD-10-CM | POA: Diagnosis not present

## 2023-03-02 DIAGNOSIS — G5793 Unspecified mononeuropathy of bilateral lower limbs: Secondary | ICD-10-CM | POA: Diagnosis not present

## 2023-03-02 DIAGNOSIS — J4489 Other specified chronic obstructive pulmonary disease: Secondary | ICD-10-CM | POA: Diagnosis not present

## 2023-03-02 DIAGNOSIS — G40219 Localization-related (focal) (partial) symptomatic epilepsy and epileptic syndromes with complex partial seizures, intractable, without status epilepticus: Secondary | ICD-10-CM | POA: Diagnosis not present

## 2023-03-02 NOTE — Telephone Encounter (Signed)
LVM for pt's daughter [name] to c/b and r/s cx appt.

## 2023-03-02 NOTE — Telephone Encounter (Signed)
Monique w/[facility] called an given  verbal orders resume PT 2 week 1, 1 week 1 with re-certification for continued services.

## 2023-03-02 NOTE — Progress Notes (Signed)
Inpatient Rehabilitation Care Coordinator Discharge Note   Patient Details  Name: [name] MRN: [id number] Date of Birth: [birth year]   Discharge location: D/c to home with dtr  Length of Stay: 8 days  Discharge activity level: Supervision  Home/community participation: Limited  Patient response QM:VHQION Literacy - How often do you need to have someone help you when you read instructions, pamphlets, or other written material from your doctor or pharmacy?: Never  Patient response GE:XBMWUX Isolation - How often do you feel lonely or isolated from those around you?: Never  Services provided included: MD, RD, PT, OT, RN, CM, Pharmacy, SW, Neuropsych, TR  Financial Services:  Field seismologist Utilized: Private Insurance [employer]  Choices offered to/list presented to: patient daughter  Follow-up services arranged:  Home Health Home Health Agency: resume services with [facility] for HHPT/OT         Patient response to transportation need: Is the patient able to respond to transportation needs?: Yes In the past 12 months, has lack of transportation kept you from medical appointments or from getting medications?: No In the past 12 months, has lack of transportation kept you from meetings, work, or from getting things needed for daily living?: No   Patient/Family verbalized understanding of follow-up arrangements:  Yes  Individual responsible for coordination of the follow-up plan: contact pt dtr [name] [phone]  Confirmed correct DME delivered: Gretchen Short 03/02/2023    Comments (or additional information):  Summary of Stay    Date/Time Discharge Planning CSW  02/23/23 1150 Pt to discharge to home with her dtr as she lives with her family. Dtr works from home and will continue to provide 24/7 care. SW will confirm there are no barriers to discharge. AAC       Uzma Hellmer A Lula Olszewski

## 2023-03-02 NOTE — Telephone Encounter (Signed)
[name] w/[facility] is call to get verbal orders resume PT 2 week 1, 1 week 1 with re-certification for continued services.

## 2023-03-03 DIAGNOSIS — I700 Atherosclerosis of aorta: Secondary | ICD-10-CM | POA: Diagnosis not present

## 2023-03-03 DIAGNOSIS — D6869 Other thrombophilia: Secondary | ICD-10-CM | POA: Diagnosis not present

## 2023-03-03 DIAGNOSIS — C50912 Malignant neoplasm of unspecified site of left female breast: Secondary | ICD-10-CM | POA: Diagnosis not present

## 2023-03-03 DIAGNOSIS — J439 Emphysema, unspecified: Secondary | ICD-10-CM | POA: Diagnosis not present

## 2023-03-03 DIAGNOSIS — Z7901 Long term (current) use of anticoagulants: Secondary | ICD-10-CM | POA: Diagnosis not present

## 2023-03-03 DIAGNOSIS — I480 Paroxysmal atrial fibrillation: Secondary | ICD-10-CM | POA: Diagnosis not present

## 2023-03-03 DIAGNOSIS — Z86718 Personal history of other venous thrombosis and embolism: Secondary | ICD-10-CM | POA: Diagnosis not present

## 2023-03-03 DIAGNOSIS — G40909 Epilepsy, unspecified, not intractable, without status epilepticus: Secondary | ICD-10-CM | POA: Diagnosis not present

## 2023-03-03 DIAGNOSIS — Z95828 Presence of other vascular implants and grafts: Secondary | ICD-10-CM | POA: Diagnosis not present

## 2023-03-03 DIAGNOSIS — K30 Functional dyspepsia: Secondary | ICD-10-CM | POA: Diagnosis not present

## 2023-03-04 DIAGNOSIS — G40219 Localization-related (focal) (partial) symptomatic epilepsy and epileptic syndromes with complex partial seizures, intractable, without status epilepticus: Secondary | ICD-10-CM | POA: Diagnosis not present

## 2023-03-04 DIAGNOSIS — J4489 Other specified chronic obstructive pulmonary disease: Secondary | ICD-10-CM | POA: Diagnosis not present

## 2023-03-04 DIAGNOSIS — N390 Urinary tract infection, site not specified: Secondary | ICD-10-CM | POA: Diagnosis not present

## 2023-03-04 DIAGNOSIS — I4892 Unspecified atrial flutter: Secondary | ICD-10-CM

## 2023-03-04 DIAGNOSIS — Z9181 History of falling: Secondary | ICD-10-CM | POA: Diagnosis not present

## 2023-03-04 DIAGNOSIS — G5793 Unspecified mononeuropathy of bilateral lower limbs: Secondary | ICD-10-CM | POA: Diagnosis not present

## 2023-03-04 DIAGNOSIS — Z87891 Personal history of nicotine dependence: Secondary | ICD-10-CM | POA: Diagnosis not present

## 2023-03-04 DIAGNOSIS — G9389 Other specified disorders of brain: Secondary | ICD-10-CM | POA: Diagnosis not present

## 2023-03-04 DIAGNOSIS — M48061 Spinal stenosis, lumbar region without neurogenic claudication: Secondary | ICD-10-CM | POA: Diagnosis not present

## 2023-03-04 NOTE — Progress Notes (Signed)
Location of Breast Cancer: Malignant neoplasm of upper-outer quadrant of left breast in female, estrogen receptor positive   Histology per Pathology Report:  01-12-23 FINAL MICROSCOPIC DIAGNOSIS:  A. 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  Negative for DCIS     - Closest margin, invasive: Lateral, 1.9 mm; Inferior, 3.0 mm     - Closet margin, DCIS: Posterior, 2.0 mm - Lymphovascular invasion (LVI): Not identified - Calcifications: Not identified - Prognostic markers:  ER positive (95%), PR negative, Her2 negative (FISH), Ki-67 5.0% - Other: Adenosis, fibrocystic changes, focal usual ductal hyperplasia (UDH) and biopsy site changes - See oncology table  INVASIVE CARCINOMA OF THE BREAST:  Resection  Procedure: Lumpectomy with sentinel lymph node excision Specimen Laterality: Left Histologic Type: Ductal adenocarcinoma Histologic Grade:      Glandular (Acinar)/Tubular Differentiation: 3      Nuclear Pleomorphism: 2      Mitotic Rate: 2      Overall Grade: 2 Tumor Size: 1.6 cm (involves 4 consecutive sections, each 4.0 mm) Ductal Carcinoma In Situ: Present, focal Lymphatic and/or Vascular Invasion: Not identified Treatment Effect in the Breast: No known presurgical therapy Margins: All margins negative for invasive carcinoma      Distance from Closest Margin (mm): 1.9 mm      Specify Closest Margin (required only if <22mm): Lateral, 1.9 mm; Inferior, 3.0 mm DCIS Margins: Uninvolved by DCIS      Distance from Closest Margin (mm): 2.0 mm      Specify Closest Margin (required only if <39mm): Posterior Regional Lymph Nodes:      Number of Lymph Nodes Examined: 1      Number of Sentinel Nodes Examined: 1      Number of Lymph Nodes with Macrometastases (>2 mm): 0      Number of Lymph Nodes with Micrometastases: 0       Number of Lymph Nodes with Isolated Tumor Cells (=0.2 mm or =200 cells): 0      Size of Largest Metastatic Deposit (mm): N/A      Extranodal Extension: N/A Distant Metastasis:      Distant Site(s) Involved: N/A Breast Biomarker Testing Performed on Previous Biopsy:      Testing Performed on Case Number: [id number]            Estrogen Receptor: 95%, positive, moderate-strong staining intensity            Progesterone Receptor: 0%, negative            HER2: Negative by FISH; 2+ by IHC            Ki-67: 5.0% Pathologic Stage Classification (pTNM, AJCC 8th Edition): pT1c, pN0 Representative Tumor Block: A1-4 (v4.5.0.0)  B. LYMPH NODE, LEFT AXILLARY #1, SENTINEL, EXCISION: - One lymph node, negative for metastatic carcinoma (0/1) - See oncology table  Receptor Status: ER(95%), PR (0%), Her2-neu (neg), Ki-67(5%)  Did patient present with symptoms (if so, please note symptoms) or was this found on screening mammography?: screening mammogram  Past/Anticipated interventions by surgeon, if any: 01/12/2023  Pre-op Diagnosis:  Left breast invasive ductal carcinoma Post-op Diagnosis: same Procedure:  Left radioactive seed localized lumpectomy and sentinel lymph node biopsy  Surgeon:  Manus Rudd K.  Past/Anticipated interventions by medical oncology, if any:  [name], MD 12/31/2022  Oncology History  Malignant neoplasm of upper-outer quadrant of left  breast in female, estrogen receptor positive (HCC)  12/15/2022 Mammogram    IMPRESSION: 1. Suspicious 1.1 cm OUTER LEFT breast mass. Tissue sampling is recommended. No abnormal appearing LEFT axillary lymph nodes. 2. No other significant mammographic findings within either breast.    12/22/2022 Pathology Results    Pathology showed grade 2 IDC, ER 95% moderate-strong staining, PR neg. Her 2 neg.    12/30/2022 Initial Diagnosis    Malignant neoplasm of upper-outer quadrant of left breast in female, estrogen receptor positive  (HCC)    12/31/2022 Cancer Staging    Staging form: Breast, AJCC 8th Edition - Clinical stage from 12/31/2022: Stage IA (cT1c, cN0, cM0, G2, ER+, PR-, HER2-) - Signed by [name], MD on 12/31/2022 Stage prefix: Initial diagnosis Histologic grading system: 3 grade system Laterality: Left Staged by: Pathologist and managing physician Stage used in treatment planning: Yes National guidelines used in treatment planning: Yes Type of national guideline used in treatment planning: NCCN  ASSESSMENT AND PLAN:  Malignant neoplasm of upper-outer quadrant of left breast in female, estrogen receptor positive (HCC) This is a very pleasant 67 year old postmenopausal female patient with newly diagnosed left breast T1 cN0 M0 invasive ductal carcinoma ER positive PR negative and HER2 negative referred to breast [facility] for additional recommendations. Given small tumor size and ER positivity, she will proceed with surgery upfront followed by Oncotype DX testing.  We have discussed the following details about Oncotype Dx today. We have discussed about Oncotype Dx score which is a well validated prognostic scoring system which can predict outcome with endocrine therapy alone and whether chemotherapy reduces recurrence.  Typically in patients with ER positive cancers that are node negative if the RS score is high typically greater than or equal to 26, chemotherapy is recommended.  In women with intermediate recurrence score younger than 50, there can still be some role for chemotherapy in addition to endocrine therapy especially if the recurrence score is between 21-25. If chemotherapy is needed, this will precede radiation and then after radiation she will continue on antiestrogen therapy.   I did discussed that she may not be an ideal candidate for chemotherapy given her seizure disorder being triggered by stress.  However patient and her daughter are both interested in knowing more about the Oncotype results hence  we will proceed with this after surgery.  If she is not a candidate for chemotherapy then she can proceed with adjuvant radiation followed by antiestrogen therapy.  I discussed the following details about antiestrogen therapy.   We have discussed options for antiestrogen therapy today  I focused most of our discussion on aromatase inhibitors.  With regards to aromatase inhibitors, we discussed mechanism of action, adverse effects including but not limited to post menopausal symptoms, arthralgias, myalgias, increased risk of cardiovascular events and bone loss.    I will plan to see her after surgery to discuss further recommendations.    Lymphedema issues, if any:    No  Pain issues, if any:   7/10 Back  SAFETY ISSUES: Prior radiation? No Pacemaker/ICD? No Possible current pregnancy? Postmenopausal Is the patient on methotrexate? no  Current Complaints / other details:

## 2023-03-09 ENCOUNTER — Encounter: Payer: Self-pay | Admitting: *Deleted

## 2023-03-10 NOTE — Progress Notes (Signed)
Radiation Oncology         [phone] ________________________________  Name: [name] MRN: [id number]  Date: 03/11/2023  DOB: April 26, 1956  Re-Evaluation Note  CC: [name], FNP  [name], MD  No diagnosis found.  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 She was seen in the multidisciplinary breast clinic on 12/31/22.   She underwent genetic testing on her consultation date. Results showed no clinically significant variants detected by [facility] genetic testing. 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   She opted to proceed with a left breast lumpectomy with left axillary SLN biopsies on 01/12/23 under the care of Dr. Corliss Skains. Pathology from the procedure revealed: tumor the size of 1.6 cm; histology of grade 2 invasive ductal carcinoma with focal intermediate grade DCIS; all margins negative for invasive and in situ carcinoma; nodal status of 1/1 left axillary sentinel lymph node excision negative for carcinoma. Prognostic indicators significant for: estrogen receptor 95% positive with moderate-strong staining intensity; progesterone receptor 0% negative; Proliferation marker Ki67 at 5%; Her2 status negative; Grade 2.   Oncotype DX was obtained on the final surgical sample and the recurrence score of 19 predicts a risk of recurrence outside the breast over the next 9 years of 6%, if the patient's only systemic therapy is an antiestrogen for 5 years.  It also predicts no significant benefit from chemotherapy.  Post-operatively, the patient developed a left axillary seroma. This required aspiration by general surgery on 01/26/23 which yielded 60 cc's of clear yellow fluid. The seroma refilled and again required aspiration on  01/28/23 which yielded 45 cc's of fluid. This issue resolved following her second aspiration.   The patient was hospitalized for several weeks in July related to her seizure disorder. Upon record review, it seems as though she has struggled finding the proper medications to manage her disorder for quite sometime, which was a primary factor leading up to her recent hospitalization. She was discharged to a rehab facility on 02/19/23 for inpatient therapies including PT, OT, and ST. She was discharged from rehab on 02/28/23.   Of note: Numerous imaging studies were performed while the patient was hospitalized. Concern for sepsis was noted while she was admitted which prompted a CT CAP on 02/15/23 which demonstrated incidental findings including a stable 5 mm nodule in the LLL which has been stable for several years. CT otherwise showed no evidence of metastatic disease in the chest, abdomen, or pelvis, and no acute of focal lesions to explain the patient's sepsis. She also had several MRI's of the brain performed while inpatient which did not demonstrate any evidence of intracranial metastatic disease.   On review of systems, the patient reports ***. She denies *** and any other symptoms.    Allergies:  is allergic to zonegran [zonisamide], chocolate, codeine, keppra [levetiracetam], lyrica [pregabalin], and olive oil.  Meds: Current Outpatient Medications  Medication Sig Dispense Refill   acetaminophen (TYLENOL) 325 MG tablet Take 1-2 tablets (325-650 mg total) by mouth every 4 (four) hours as needed for mild pain.     cyanocobalamin (VITAMIN B12) 1000 MCG tablet Take 2 tablets (2,000 mcg total) by mouth daily. 60 tablet 0   diclofenac Sodium (VOLTAREN) 1 % GEL Apply 2 g topically 4 (four)  times daily as needed. To top of right foot 100 g 0   DILANTIN 100 MG ER capsule Take 1 capsule (100 mg total) by mouth 2 (two) times daily. 180 capsule 3   DILANTIN 30 MG ER capsule TAKE 1 CAPSULE BY MOUTH EVERY   NIGHT ALONG WITH 100 MG CAPSULE  FOR A TOTAL EVERY NIGHT DOSE OF  130 MG (Patient taking differently: Take 30 mg by mouth daily. TAKE 1 CAPSULE BY MOUTH EVERY morning ALONG WITH 100 MG CAPSULE  FOR A TOTAL morning DOSE OF 130 MG) 100 capsule 3   folic acid (FOLVITE) 1 MG tablet Take 1 tablet (1 mg total) by mouth daily. 30 tablet 0   gabapentin (NEURONTIN) 100 MG capsule Take 1 capsule (100 mg total) by mouth 3 (three) times daily. 90 capsule 0   LORazepam (ATIVAN) 1 MG tablet TAKE 1 TABLET BY MOUTH AS DIRECTED AS NEEDED FOR 3 OR MORE SEIZURES IN 24 HOURS. DO NOT TAKE MORE THAN 2 TABLETS IN 24 HOURS. Strength: 1 mg 10 tablet 5   oxybutynin (DITROPAN-XL) 5 MG 24 hr tablet Take 1 tablet (5 mg total) by mouth at bedtime. 30 tablet 0   potassium chloride SA (KLOR-CON M) 20 MEQ tablet Take 1 tablet (20 mEq total) by mouth every other day. 15 tablet 0   topiramate (TOPAMAX) 50 MG tablet Take 1 tablet (50 mg total) by mouth every morning AND 2 tablets (100 mg total) at bedtime. 90 tablet 0   VIMPAT 200 MG TABS tablet Take 1 tablet (200 mg total) by mouth 2 (two) times daily.     warfarin (COUMADIN) 6 MG tablet Take 1 tablet (6 mg total) by mouth daily at 4 PM. 30 tablet 0   No current facility-administered medications for this encounter.    Physical Findings: The patient is in no acute distress. Patient is alert and oriented.  vitals were not taken for this visit.  No significant changes. Lungs are clear to auscultation bilaterally. Heart has regular rate and rhythm. No palpable cervical, supraclavicular, or axillary adenopathy. Abdomen soft, non-tender, normal bowel sounds. Right Breast: no palpable mass, nipple discharge or bleeding. Left Breast: ***  Lab Findings: Lab Results  Component Value Date   WBC 4.2 02/26/2023   HGB 11.0 (L) 02/26/2023   HCT 35.2 (L) 02/26/2023   MCV 106.0 (H) 02/26/2023   PLT 299 02/26/2023    Radiographic Findings: DG Foot 2 Views Right  Result Date:  02/21/2023 CLINICAL DATA:  Seizure EXAM: RIGHT FOOT - 2 VIEW COMPARISON:  None Available. FINDINGS: There is no evidence of fracture or dislocation. There is no evidence of arthropathy or other focal bone abnormality. Soft tissues are unremarkable. IMPRESSION: No fracture or dislocation of the right foot. Joint spaces preserved. Electronically Signed   By: Jearld Lesch M.D.   On: 02/21/2023 16:35   CT ABDOMEN PELVIS WO CONTRAST  Result Date: 02/19/2023 CLINICAL DATA:  Flank pain EXAM: CT ABDOMEN AND PELVIS WITHOUT CONTRAST TECHNIQUE: Multidetector CT imaging of the abdomen and pelvis was performed following the standard protocol without IV contrast. RADIATION DOSE REDUCTION: This exam was performed according to the departmental dose-optimization program which includes automated exposure control, adjustment of the mA and/or kV according to patient size and/or use of iterative reconstruction technique. COMPARISON:  02/15/2023 FINDINGS: Lower chest: Visualized lower lung fields are unremarkable. Minimal pericardial effusion is seen. Hepatobiliary: Liver measures 19.4 cm in length. There is no dilation of bile ducts. Gallbladder is not distended. Pancreas:  No focal abnormalities are seen. Spleen: Unremarkable. Adrenals/Urinary Tract: Adrenals are unremarkable. There is no hydronephrosis. There are no renal or ureteral stones. Urinary bladder is unremarkable. Stomach/Bowel: Stomach is unremarkable the small bowel loops are not dilated. Appendix is not dilated. There is no significant wall thickening in colon. There is no pericolic stranding. Vascular/Lymphatic: Arterial calcifications are seen in aorta and its major branches. Inferior vena cava filter is seen. Reproductive: Unremarkable. Other: There is no ascites or pneumoperitoneum. There is low-density nodular density in retrocrural region which has not changed significantly in comparison with previous studies dating as far back as 05/20/2010 suggesting benign  process. Umbilical hernia containing fat is seen. Small pockets of air in subcutaneous plane in the anterior abdominal wall may be related to parenteral administration of medication. There are ectatic vessels in subcutaneous plane in both inguinal regions which appear stable. Right inguinal hernia containing fat is seen. Musculoskeletal: Slight decrease in height of few vertebral bodies at the thoracolumbar junction has not changed. IMPRESSION: There is no evidence of intestinal obstruction or pneumoperitoneum. There is no hydronephrosis. Appendix is not dilated. Aortic arteriosclerosis. Other findings as described in the body of the report. Electronically Signed   By: Ernie Avena M.D.   On: 02/19/2023 14:09   CT ANGIO HEAD NECK W WO CM  Result Date: 02/18/2023 CLINICAL DATA:  DVT with need for anticoagulation. History of ruptured aneurysm in 1989. EXAM: CT ANGIOGRAPHY HEAD AND NECK WITH AND WITHOUT CONTRAST TECHNIQUE: Multidetector CT imaging of the head and neck was performed using the standard protocol during bolus administration of intravenous contrast. Multiplanar CT image reconstructions and MIPs were obtained to evaluate the vascular anatomy. Carotid stenosis measurements (when applicable) are obtained utilizing NASCET criteria, using the distal internal carotid diameter as the denominator. RADIATION DOSE REDUCTION: This exam was performed according to the departmental dose-optimization program which includes automated exposure control, adjustment of the mA and/or kV according to patient size and/or use of iterative reconstruction technique. CONTRAST:  75mL OMNIPAQUE IOHEXOL 350 MG/ML SOLN COMPARISON:  Brain MRI from 3 days ago FINDINGS: CT HEAD FINDINGS Brain: Dense encephalomalacia in the parasagittal right occipital and parietal lobes beneath the craniotomy flap. No evidence of acute infarct, hemorrhage, hydrocephalus, mass, or collection. Vascular: See below Skull: Right posterior craniotomy  which is mildly sunken Sinuses/Orbits: No acute finding. Review of the MIP images confirms the above findings CTA NECK FINDINGS Aortic arch: Limited coverage showing atherosclerosis. The brachiocephalic origin is not seen. Right carotid system: Significant motion artifact. Mild atheromatous plaque without stenosis or ulceration. Left carotid system: Significant motion artifact. Mild to moderate plaque at the bifurcation which is mixed density. No stenosis or ulceration. Vertebral arteries: Proximal subclavian atherosclerosis without significant stenosis or ulceration. The left vertebral artery is dominant. Vertebral arteries are smoothly contoured and widely patent. Skeleton: No acute or aggressive finding Other neck: Motion artifact.  No inflammation or mass. Upper chest: Centrilobular emphysema at the apices. Review of the MIP images confirms the above findings CTA HEAD FINDINGS Anterior circulation: Atheromatous plaque at the carotid siphons. No branch occlusion, beading, or aneurysm. Posterior circulation: The vertebral and basilar arteries are smoothly contoured and widely patent. Diminished flow in peripheral right PCA branches in the setting of dense remote infarct. No aneurysm or signs of vascular malformation. Venous sinuses: Diffusely patent Anatomic variants: None significant Review of the MIP images confirms the above findings IMPRESSION: No emergent finding. Negative for aneurysm or vascular malformation. Electronically Signed   By: Tiburcio Pea  M.D.   On: 02/18/2023 05:07   VAS [place] UPPER EXTREMITY VENOUS DUPLEX  Result Date: 02/17/2023 UPPER VENOUS STUDY  Patient Name:  JERIANN HALLEN  Date of Exam:   02/17/2023 Medical Rec #: [id number]       Accession #:    [id number] Date of Birth: May 27, 1956       Patient Gender: F Patient Age:   29 years Exam Location:  [facility] Procedure:      VAS [place] UPPER EXTREMITY VENOUS DUPLEX Referring Phys: Bess Harvest [facility]  --------------------------------------------------------------------------------  Indications: Fever Other Indications: Recent RUE PICC line placement. Risk Factors: DVT HX of multiple DVTs. Limitations: PICC line placement/bandage. Comparison Study: Previous exam on 08/09/20 was postive for DVT/SVT in BIL                   brachial veins and RUE basilic vein. Performing Technologist: Ernestene Mention RVT, RDMS  Examination Guidelines: A complete evaluation includes B-mode imaging, spectral Doppler, color Doppler, and power Doppler as needed of all accessible portions of each vessel. Bilateral testing is considered an integral part of a complete examination. Limited examinations for reoccurring indications may be performed as noted.  Right Findings: +----------+------------+---------+-----------+----------+--------------------+ RIGHT     CompressiblePhasicitySpontaneousProperties      Summary        +----------+------------+---------+-----------+----------+--------------------+ IJV           Full       Yes       Yes                                   +----------+------------+---------+-----------+----------+--------------------+ Subclavian    Full       Yes       Yes                                   +----------+------------+---------+-----------+----------+--------------------+ Axillary    Partial      Yes       Yes                     Acute         +----------+------------+---------+-----------+----------+--------------------+ Brachial      Full       Yes       Yes              not well visualized  +----------+------------+---------+-----------+----------+--------------------+ Radial        Full                                                       +----------+------------+---------+-----------+----------+--------------------+ Ulnar         Full                                                        +----------+------------+---------+-----------+----------+--------------------+ Cephalic      Full                                                       +----------+------------+---------+-----------+----------+--------------------+  Basilic       Full                                   only visualized in                                                            forearm        +----------+------------+---------+-----------+----------+--------------------+ Brachial vein note well visualized and upper arm basilic vein not seen due to PICC line placement.  Left Findings: +----------+------------+---------+-----------+----------+-------+ LEFT      CompressiblePhasicitySpontaneousPropertiesSummary +----------+------------+---------+-----------+----------+-------+ Subclavian               Yes       Yes                      +----------+------------+---------+-----------+----------+-------+  Summary:  Right: No evidence of superficial vein thrombosis in the upper extremity. Findings consistent with acute deep vein thrombosis involving the right axillary vein.  Left: No evidence of thrombosis in the subclavian.  *See table(s) above for measurements and observations.  Diagnosing physician: Waverly Ferrari MD Electronically signed by Waverly Ferrari MD on 02/17/2023 at 5:47:27 PM.    Final    DG Chest Port 1 View  Result Date: 02/17/2023 CLINICAL DATA:  Shortness of breath EXAM: PORTABLE CHEST 1 VIEW COMPARISON:  Previous studies including the examination of 02/16/2023 FINDINGS: Cardiac size is within normal limits. Low position of diaphragms suggests COPD. There are no signs of pulmonary edema or focal pulmonary consolidation. Tip of right PICC line is seen in superior vena cava. Surgical clips are seen in the left chest wall. IMPRESSION: There are no signs of pulmonary edema or new focal infiltrates. Electronically Signed   By: Ernie Avena M.D.   On: 02/17/2023 08:30    DG Chest Port 1 View  Result Date: 02/16/2023 CLINICAL DATA:  Shortness of breath EXAM: PORTABLE CHEST 1 VIEW COMPARISON:  02/15/2023 FINDINGS: Tip of right PICC line is seen in superior vena cava. Cardiac size is within normal limits. Low position of the diaphragms suggests COPD. There are no signs of alveolar pulmonary edema or focal pulmonary consolidation. There is no significant pleural effusion or pneumothorax. Surgical clips are seen in left chest wall. Old healed fracture is seen in right clavicle. IMPRESSION: There are no signs of pulmonary edema or focal pulmonary consolidation. Electronically Signed   By: Ernie Avena M.D.   On: 02/16/2023 21:56   MR CERVICAL SPINE W CONTRAST  Result Date: 02/16/2023 CLINICAL DATA:  Fever and neck pain EXAM: MRI CERVICAL SPINE WITH CONTRAST TECHNIQUE: Multiplanar, multisequence MR imaging of the cervical spine was performed following the administration of intravenous contrast. COMPARISON:  Cervical spine MRI without contrast 02/15/2023 FINDINGS: Postcontrast images show no epidural phlegmon or abscess. No abnormal bone marrow contrast enhancement or other evidence of discitis-osteomyelitis. IMPRESSION: No evidence of discitis-osteomyelitis or epidural phlegmon/abscess. Electronically Signed   By: Deatra Robinson M.D.   On: 02/16/2023 20:06   VAS [place] LOWER EXTREMITY VENOUS (DVT)  Result Date: 02/16/2023  Lower Venous DVT Study Patient Name:  CHERILEE MENARD  Date of Exam:   02/15/2023 Medical Rec #: [id number]       Accession #:  [id number] Date of Birth: 1956/01/20       Patient Gender: F Patient Age:   42 years Exam Location:  [facility] Procedure:      VAS [place] LOWER EXTREMITY VENOUS (DVT) Referring Phys: Bess Harvest [facility] --------------------------------------------------------------------------------  Indications: Fever of unkown origin. Other Indications: History of LT lower DVT 10-10-2020. IVC filter placed                    10-12-2020. Comparison  Study: Chronic LLE DVT on most recent study 04-28-2022. Performing Technologist: Jean Rosenthal RDMS, RVT  Examination Guidelines: A complete evaluation includes B-mode imaging, spectral Doppler, color Doppler, and power Doppler as needed of all accessible portions of each vessel. Bilateral testing is considered an integral part of a complete examination. Limited examinations for reoccurring indications may be performed as noted. The reflux portion of the exam is performed with the patient in reverse Trendelenburg.  +---------+---------------+---------+-----------+----------+--------------+ RIGHT    CompressibilityPhasicitySpontaneityPropertiesThrombus Aging +---------+---------------+---------+-----------+----------+--------------+ CFV      Full           Yes      Yes                                 +---------+---------------+---------+-----------+----------+--------------+ SFJ      Full                                                        +---------+---------------+---------+-----------+----------+--------------+ FV Prox  Full                                                        +---------+---------------+---------+-----------+----------+--------------+ FV Mid   Full                                                        +---------+---------------+---------+-----------+----------+--------------+ FV DistalFull                                                        +---------+---------------+---------+-----------+----------+--------------+ PFV      Full                                                        +---------+---------------+---------+-----------+----------+--------------+ POP      Full           Yes      Yes                                 +---------+---------------+---------+-----------+----------+--------------+ PTV      Full                                                         +---------+---------------+---------+-----------+----------+--------------+  PERO     Full                                                        +---------+---------------+---------+-----------+----------+--------------+   +---------+---------------+---------+-----------+----------+--------------+ LEFT     CompressibilityPhasicitySpontaneityPropertiesThrombus Aging +---------+---------------+---------+-----------+----------+--------------+ CFV      Full           Yes      Yes                                 +---------+---------------+---------+-----------+----------+--------------+ SFJ      Full                                                        +---------+---------------+---------+-----------+----------+--------------+ FV Prox  Full                                                        +---------+---------------+---------+-----------+----------+--------------+ FV Mid   Full                                                        +---------+---------------+---------+-----------+----------+--------------+ FV DistalFull                                                        +---------+---------------+---------+-----------+----------+--------------+ PFV      Full                                                        +---------+---------------+---------+-----------+----------+--------------+ POP      Full           Yes      Yes                                 +---------+---------------+---------+-----------+----------+--------------+ PTV      Full                                                        +---------+---------------+---------+-----------+----------+--------------+ PERO     Full                                                        +---------+---------------+---------+-----------+----------+--------------+  Summary: RIGHT: - There is no evidence of deep vein thrombosis in the lower extremity.  - No cystic structure found in  the popliteal fossa.  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 - No cystic structure found in the popliteal fossa.  *See table(s) above for measurements and observations. Electronically signed by Lemar Livings MD on 02/16/2023 at 7:13:50 PM.    Final    ECHOCARDIOGRAM COMPLETE  Result Date: 02/16/2023    ECHOCARDIOGRAM REPORT   Patient Name:   MARVIE BASOM Date of Exam: 02/16/2023 Medical Rec #:  [id number]      Height:       67.0 in Accession #:    [id number]     Weight:       124.3 lb Date of Birth:  10-24-1955      BSA:          1.652 m Patient Age:    66 years       BP:           110/61 mmHg Patient Gender: F              HR:           83 bpm. Exam Location:  Inpatient Procedure: 2D Echo, Cardiac Doppler and Color Doppler Indications:    Atrial fibrillation  History:        Patient has prior history of Echocardiogram examinations, most                 recent 08/03/2020. DVT, COPD and Stroke; Risk                 Factors:Hypertension, Dyslipidemia and Former Smoker. Breast CA.  Sonographer:    Milda Smart Referring Phys: Stanford Scotland [facility]  Sonographer Comments: Image acquisition challenging due to patient body habitus and Image acquisition challenging due to respiratory motion. IMPRESSIONS  1. Left ventricular ejection fraction, by estimation, is 60 to 65%. The left ventricle has normal function. The left ventricle has no regional wall motion abnormalities. Left ventricular diastolic parameters are consistent with Grade I diastolic dysfunction (impaired relaxation).  2. Right ventricular systolic function is normal. The right ventricular size is normal.  3. Left atrial size was mildly dilated.  4. The mitral valve is normal in structure. Trivial mitral valve regurgitation. No evidence of mitral stenosis.  5. The aortic valve was not well visualized. There is mild calcification of the aortic valve. Aortic valve regurgitation is not visualized. Aortic valve  sclerosis/calcification is present, without any evidence of aortic stenosis.  6. The inferior vena cava is normal in size with greater than 50% respiratory variability, suggesting right atrial pressure of 3 mmHg. FINDINGS  Left Ventricle: Left ventricular ejection fraction, by estimation, is 60 to 65%. The left ventricle has normal function. The left ventricle has no regional wall motion abnormalities. The left ventricular internal cavity size was normal in size. There is  no left ventricular hypertrophy. Left ventricular diastolic parameters are consistent with Grade I diastolic dysfunction (impaired relaxation). Right Ventricle: The right ventricular size is normal. No increase in right ventricular wall thickness. Right ventricular systolic function is normal. Left Atrium: Left atrial size was mildly dilated. Right Atrium: Right atrial size was normal in size. Pericardium: There is no evidence of pericardial effusion. Mitral Valve: The mitral valve is normal in structure. Trivial mitral valve regurgitation. No evidence of mitral valve stenosis. MV peak gradient, 4.8 mmHg. The mean mitral valve gradient  is 2.0 mmHg. Tricuspid Valve: The tricuspid valve is normal in structure. Tricuspid valve regurgitation is trivial. No evidence of tricuspid stenosis. Aortic Valve: The aortic valve was not well visualized. There is mild calcification of the aortic valve. Aortic valve regurgitation is not visualized. Aortic valve sclerosis/calcification is present, without any evidence of aortic stenosis. Pulmonic Valve: The pulmonic valve was normal in structure. Pulmonic valve regurgitation is not visualized. No evidence of pulmonic stenosis. Aorta: The aortic root is normal in size and structure. Venous: The inferior vena cava is normal in size with greater than 50% respiratory variability, suggesting right atrial pressure of 3 mmHg. IAS/Shunts: No atrial level shunt detected by color flow Doppler.  LEFT VENTRICLE PLAX 2D LVIDd:          4.10 cm     Diastology LVIDs:         2.90 cm     LV e' medial:    6.96 cm/s LV PW:         0.90 cm     LV E/e' medial:  12.5 LV IVS:        0.90 cm     LV e' lateral:   10.70 cm/s LVOT diam:     1.90 cm     LV E/e' lateral: 8.1 LV SV:         63 LV SV Index:   38 LVOT Area:     2.84 cm  LV Volumes (MOD) LV vol d, MOD A2C: 56.9 ml LV vol d, MOD A4C: 61.0 ml LV vol s, MOD A2C: 18.7 ml LV vol s, MOD A4C: 17.3 ml LV SV MOD A2C:     38.2 ml LV SV MOD A4C:     61.0 ml LV SV MOD BP:      41.9 ml RIGHT VENTRICLE RV S prime:     15.00 cm/s TAPSE (M-mode): 2.3 cm LEFT ATRIUM             Index        RIGHT ATRIUM           Index LA diam:        2.40 cm 1.45 cm/m   RA Area:     10.90 cm LA Vol (A2C):   23.6 ml 14.28 ml/m  RA Volume:   23.80 ml  14.40 ml/m LA Vol (A4C):   25.3 ml 15.31 ml/m LA Biplane Vol: 26.4 ml 15.98 ml/m  AORTIC VALVE LVOT Vmax:   124.00 cm/s LVOT Vmean:  91.200 cm/s LVOT VTI:    0.223 m  AORTA Ao Root diam: 2.40 cm Ao Asc diam:  2.60 cm MITRAL VALVE               TRICUSPID VALVE MV Area (PHT): 2.99 cm    TR Peak grad:   13.2 mmHg MV Area VTI:   2.16 cm    TR Vmax:        182.00 cm/s MV Peak grad:  4.8 mmHg MV Mean grad:  2.0 mmHg    SHUNTS MV Vmax:       1.09 m/s    Systemic VTI:  0.22 m MV Vmean:      72.1 cm/s   Systemic Diam: 1.90 cm MV Decel Time: 254 msec MV E velocity: 86.90 cm/s MV A velocity: 62.30 cm/s MV E/A ratio:  1.39 Arvilla Meres MD Electronically signed by Arvilla Meres MD Signature Date/Time: 02/16/2023/1:00:33 PM    Final    MR BRAIN W CONTRAST  Result Date: 02/16/2023 CLINICAL DATA:  Headache, fever. EXAM: MRI HEAD WITH CONTRAST TECHNIQUE: Multiplanar, multiecho pulse sequences of the brain and surrounding structures were obtained with intravenous contrast. CONTRAST:  6mL GADAVIST GADOBUTROL 1 MMOL/ML IV SOLN COMPARISON:  MRI brain February 15, 2023. FINDINGS: Images obtained are partially degraded by motion. Postsurgical changes from right parietooccipital  craniotomy with right parietooccipital and posterior temporal encephalomalacia/resection cavity, similar to prior studies. No focus of abnormal contrast enhancement identified. IMPRESSION: No focus of abnormal contrast enhancement to suggest leptomeningeal disease or mass lesion. Electronically Signed   By: Baldemar Lenis M.D.   On: 02/16/2023 09:02   MR CERVICAL SPINE WO CONTRAST  Result Date: 02/15/2023 CLINICAL DATA:  Neck pain EXAM: MRI CERVICAL SPINE WITHOUT CONTRAST TECHNIQUE: Multiplanar, multisequence MR imaging of the cervical spine was performed. No intravenous contrast was administered. COMPARISON:  03/20/2018 MRI cervical spine FINDINGS: Evaluation is significantly motion limited, despite the use of less motion sensitive sequences. Within this limitation, there is disc height loss at C5-C6 and C6-C7, with possible mild spinal canal stenosis at these levels. The axial images are essentially nondiagnostic for neural foraminal narrowing. No significant listhesis. Preservation of the normal cervical lordosis. IMPRESSION: Significantly motion limited examination. Within this limitation, there is disc height loss at C5-C6 and C6-C7 with possible mild spinal canal stenosis at these levels. The axial images are essentially nondiagnostic for neural foraminal narrowing. Electronically Signed   By: Wiliam Ke M.D.   On: 02/15/2023 19:44   MR BRAIN WO CONTRAST  Result Date: 02/15/2023 CLINICAL DATA:  Headache, fever EXAM: MRI HEAD WITHOUT CONTRAST TECHNIQUE: Multiplanar, multiecho pulse sequences of the brain and surrounding structures were obtained without intravenous contrast. COMPARISON:  08/26/2019 FINDINGS: Evaluation is limited by motion. Brain: No restricted diffusion to suggest acute or subacute infarct. No acute hemorrhage, mass, mass effect, or midline shift. No hydrocephalus or extra-axial collection. Normal pituitary and craniocervical junction. Redemonstrated large area of  right parietooccipital encephalomalacia, which communicates with a dilated right lateral ventricle, unchanged. T2 hyperintense signal in the periventricular white matter, likely the sequela of mild chronic small vessel ischemic disease. Redemonstrated asymmetric right hippocampal volume loss. Vascular: Normal arterial flow voids. Skull and upper cervical spine: Right parietooccipital craniotomy. Otherwise normal marrow signal. Sinuses/Orbits: Mucosal thickening in the right maxillary sinus. No acute finding in the orbits. Other: Chronic left mastoid effusion. Trace fluid in the right mastoid air cells. IMPRESSION: No acute intracranial process. No evidence of acute or subacute infarct. Electronically Signed   By: Wiliam Ke M.D.   On: 02/15/2023 19:44   CT CHEST ABDOMEN PELVIS W CONTRAST  Result Date: 02/15/2023 CLINICAL DATA:  Sepsis.  Personal history of breast cancer. EXAM: CT CHEST, ABDOMEN, AND PELVIS WITH CONTRAST TECHNIQUE: Multidetector CT imaging of the chest, abdomen and pelvis was performed following the standard protocol during bolus administration of intravenous contrast. RADIATION DOSE REDUCTION: This exam was performed according to the departmental dose-optimization program which includes automated exposure control, adjustment of the mA and/or kV according to patient size and/or use of iterative reconstruction technique. CONTRAST:  75mL OMNIPAQUE IOHEXOL 350 MG/ML SOLN COMPARISON:  CT of the abdomen and pelvis 07/31/2022. CT of the chest, abdomen and pelvis 08/02/2020 FINDINGS: CT CHEST FINDINGS Cardiovascular: The heart size is normal. Aortic valve calcifications are present. Atherosclerotic calcifications are present at the aortic arch and great vessel origins without aneurysm or definite stenosis. Pulmonary arteries are unremarkable. A right-sided PICC line terminates just above the cavoatrial junction. Mediastinum/Nodes:  No enlarged mediastinal, hilar, or axillary lymph nodes. Thyroid  gland, trachea, and esophagus demonstrate no significant findings. Lungs/Pleura: Centrilobular emphysematous changes are present. The airways are patent. A 5 mm nodule is present posteriorly in the left lower lobe on image 104 of series 4. It is stable for over 2 years. No other nodule or mass lesion is present. No significant airspace disease is present. No significant pleural effusion or pneumothorax is present. Musculoskeletal: The vertebral body heights and alignment are normal. Slight exaggerated kyphosis is stable. No focal osseous lesions are present. CT ABDOMEN PELVIS FINDINGS Hepatobiliary: No focal liver abnormality is seen. No gallstones, gallbladder wall thickening, or biliary dilatation. Pancreas: Unremarkable. No pancreatic ductal dilatation or surrounding inflammatory changes. Spleen: Normal in size without focal abnormality. Adrenals/Urinary Tract: The adrenal glands are normal bilaterally. No stone or mass lesion is present. No obstruction is present. Ureters are within normal limits. The urinary bladder is normal. Stomach/Bowel: The stomach is within normal limits. A duodenal diverticulum is present without inflammatory change. Small bowel is within normal limits. The terminal ileum is normal. The appendix is not discretely visualized and may be surgically absent. The ascending and transverse colon are within normal limits. The descending and sigmoid colon are within normal limits. Vascular/Lymphatic: Extensive atherosclerotic calcifications are present the aorta and branch vessels. No aneurysm is present. IVC filter is in place. No significant adenopathy is present. Reproductive: Uterus and bilateral adnexa are unremarkable. Other: No abdominal wall hernia or abnormality. No abdominopelvic ascites. Musculoskeletal: Vertebral body heights are normal. No acute or healing fractures are present. No focal osseous lesions are present. The bony pelvis is within normal limits. The hips are located and  normal. IMPRESSION: 1. No acute or focal lesion to explain the patient's sepsis. 2. Stable 5 mm nodule posteriorly in the left lower lobe. Given the stability for over 2 years, this is likely benign. Recommend no imaging follow-up. 3. IVC filter is in place. 4. No evidence for metastatic disease to the chest, abdomen, or pelvis. 5. Aortic Atherosclerosis (ICD10-I70.0) and Emphysema (ICD10-J43.9). Electronically Signed   By: Marin Roberts M.D.   On: 02/15/2023 11:57   DG Chest Port 1 View  Result Date: 02/15/2023 CLINICAL DATA:  67 year old female with history of shortness of breath. Seizures. EXAM: PORTABLE CHEST 1 VIEW COMPARISON:  Chest x-ray 02/13/2023. FINDINGS: There is a right upper extremity PICC with tip terminating in the distal superior vena cava. Lung volumes are normal. No consolidative airspace disease. No pleural effusions. No pneumothorax. No pulmonary nodule or mass noted. Pulmonary vasculature and the cardiomediastinal silhouette are within normal limits. Atherosclerosis in the thoracic aorta. Numerous surgical clips project over the left breast and left axilla, likely from prior lumpectomy and lymph node dissection. IMPRESSION: 1. Support apparatus, as above. 2. No radiographic evidence of acute cardiopulmonary disease. 3. Aortic atherosclerosis. Electronically Signed   By: Trudie Reed M.D.   On: 02/15/2023 08:15   DG Chest Port 1 View  Result Date: 02/13/2023 CLINICAL DATA:  Fever EXAM: PORTABLE CHEST 1 VIEW COMPARISON:  02/09/2023 FINDINGS: PICC on the right with tip at the upper cavoatrial junction. Bilateral interstitial coarsening from likely from airway thickening. There is no edema, consolidation, effusion, or pneumothorax. Normal heart size and mediastinal contours. Remote right mid clavicle fracture with healing. IMPRESSION: Mild bronchitic markings.  No focal pneumonia. Electronically Signed   By: Tiburcio Pea M.D.   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 She presents with breakthrough seizures in the setting of medication noncompliance due to changes in insurance causing delay in receiving her Vimpat.  EEG to evaluate for seizure  Level of alertness: Awake, asleep  AEDs during EEG study: LCM, PHT, GBP, Onfi  Technical aspects: This EEG study was done with scalp electrodes positioned according to the 10-20 International system of electrode placement. Electrical activity was reviewed with band pass filter of 1-70Hz , sensitivity of 7 uV/mm, display speed of 35mm/sec with a 60Hz  notched filter applied as appropriate. EEG data were recorded continuously and digitally stored.  Video monitoring was available and reviewed as appropriate.  Description: The posterior dominant rhythm consists of 8-9 Hz activity of moderate voltage (25-35 uV) seen predominantly in posterior head regions, symmetric and reactive to eye opening and eye closing. Sleep was characterized by sleep spindles (12-14hz ), maximal fronto-central region. EEG showed continuous polymorphic sharply contoured rhythmic 3 to 6 Hz theta-delta slowing in right hemisphere, maximal right posterior quadrant. Abundant sharp waves were noted in right posterior quadrant. Hyperventilation and photic stimulation were not performed.   ABNORMALITY - Sharp waves, right posterior quadrant - Continuous rhythmic slow, right hemisphere, maximal right posterior quadrant  IMPRESSION: This study showed evidence of epileptogenicity and cortical dysfunction arising from right hemisphere, maximal right posterior quadrant. No seizures  were noted.  Charlsie Quest  [place] EKG SITE RITE  Result Date: 02/10/2023 If Site Rite image not attached, placement could not be confirmed due to current cardiac rhythm.  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 She presents with breakthrough seizures in the setting of medication noncompliance due to changes in insurance causing delay in receiving her Vimpat.  EEG to evaluate for seizure  Level of alertness: Awake, asleep  AEDs during EEG study: LCM, PHT, GBP, Ativan  Technical aspects: This EEG study was done with scalp electrodes positioned according to the 10-20 International system of electrode placement. Electrical activity was reviewed with band pass filter of 1-70Hz , sensitivity of 7 uV/mm, display speed of 56mm/sec with a 60Hz  notched filter applied as appropriate. EEG data were recorded continuously and digitally stored.  Video monitoring was available and reviewed as appropriate.  Description: The posterior dominant rhythm consists of 8-9 Hz activity of moderate voltage (25-35 uV) seen predominantly in posterior head regions, symmetric and reactive to eye opening and eye closing. Sleep was characterized by sleep spindles (12-14hz ), maximal fronto-central region. EEG showed continuous polymorphic sharply contoured rhythmic 3 to 6 Hz theta-delta slowing in right hemisphere, maximal right posterior quadrant with waxing and waning morphology. Lateralized periodic discharges with overriding fast activity were noted in right hemisphere, maximal right posterior quadrant, at 1Hz . Independent left anterior temporal spikes were also  noted. Hyperventilation and photic stimulation were not performed.   56 seizures were noted. No clinical signs were seen. EEG  showed 3-5Hz  theta-delta slowing in right posterior quadrant which the involves into right frontotemporal region admixed with sharp waves at 3-4hz . EEG then showed high amplitude sharply contoured 12-13Hz  beta activity and involved left hemisphere. Subsequently EEG showed 2-3Hz  delta slowing. Average duration of seizures was 45 seconds to 1 minute. Last seizure was noted on 02/10/2023 at 1313.  ABNORMALITY - Seizure without clinical signs, right posterior quadrant - Lateralized periodic discharges with overriding fast activity, right hemisphere, maximal right posterior quadrant ( LPD+F) - Spike, left anterior temporal - Continuous rhythmic slow, right hemisphere, maximal right posterior quadrant  IMPRESSION: This study showed 56 seizures without clinical signs arising from  right posterior quadrant, lasting about 45 seconds to 1 minute. Last seizure was noted on 02/10/2023 at 1313.  Additionally there was evidence of epileptogenicity and cortical dysfunction arising from right hemisphere, maximal right posterior quadrant. There was also independent epileptogenicity arising from left anterior temporal region. Dr. Otelia Limes was notified.  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 She presents with breakthrough seizures in the setting of medication noncompliance due to changes in insurance causing delay in receiving her Vimpat.  EEG to evaluate for seizure Level of alertness: Awake AEDs during EEG study: LCM, PHT, Ativan  Technical aspects: This EEG study was done with scalp electrodes positioned according to the 10-20 International system of electrode placement. Electrical activity was reviewed with band pass filter of 1-70Hz , sensitivity of 7 uV/mm, display speed of 78mm/sec with a 60Hz  notched filter applied as appropriate. EEG data were recorded continuously and digitally stored.  Video monitoring was available and reviewed as appropriate. Description: The posterior dominant rhythm consists of 8-9 Hz activity of moderate voltage (25-35 uV) seen predominantly in posterior head regions, symmetric and reactive to eye opening and eye closing. EEG showed continuous polymorphic sharply contoured rhythmic 3 to 6 Hz theta-delta slowing in right hemisphere, maximal right posterior quadrant with waxing and waning morphology. Abundant spike and waves were noted in right hemisphere, maximal right posterior quadrant, at times qasi- periodic at 1-2Hz . Frequently, the right hemispheric spikes also involved the left  frontal region. Hyperventilation and photic stimulation were not performed.   ABNORMALITY - Spike and waves, right hemisphere, maximal right posterior quadrant - Continuous rhythmic slow, right hemisphere, maximal right posterior quadrant IMPRESSION: This study showed evidence of epileptogenicity and cortical dysfunction arising from right hemisphere, maximal right posterior quadrant. This EEG pattern is on the ictal-interictal continuum with potential for seizures. No definite seizures were seen throughout the recording. Dr. Otelia Limes was notified. Charlsie Quest   DG Chest Portable 1 View  Result Date: 02/09/2023 CLINICAL DATA:  Cough. EXAM: PORTABLE CHEST 1 VIEW COMPARISON:  October 07, 2022. FINDINGS: The heart size and mediastinal contours are within normal limits. Both lungs are clear. The visualized skeletal structures are unremarkable. IMPRESSION: No active disease. Electronically Signed   By: Lupita Raider M.D.   On:  02/09/2023 13:37   CT HEAD WO CONTRAST ( )  Result Date: 02/09/2023 CLINICAL DATA:  Provided history: Seizure disorder, clinical change. EXAM: CT HEAD WITHOUT CONTRAST TECHNIQUE: Contiguous axial images were obtained from the base of the skull  through the vertex without intravenous contrast. RADIATION DOSE REDUCTION: This exam was performed according to the departmental dose-optimization program which includes automated exposure control, adjustment of the mA and/or kV according to patient size and/or use of iterative reconstruction technique. COMPARISON:  Prior head CT examinations 11/04/2022 and earlier. FINDINGS: Brain: Redemonstrated large focus of chronic encephalomalacia/gliosis within the posterior right cerebral hemisphere (underlying a cranioplasty). There is no acute intracranial hemorrhage. No acute demarcated cortical infarct. No extra-axial fluid collection. No evidence of an intracranial mass. No midline shift. Vascular: No hyperdense vessel.  Atherosclerotic calcifications. Skull: Right parietooccipital cranioplasty. No acute calvarial fracture or aggressive osseous lesion. Sinuses/Orbits: No orbital mass or acute orbital finding. Small to moderate-sized fluid level, and mild background mucosal thickening, within the right maxillary sinus. Other: Small-volume fluid within the left mastoid air cells. IMPRESSION: 1.  No evidence of an acute intracranial abnormality. 2. 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 Correlate with the surgical history. 3. Right maxillary sinusitis. 4. Small-volume fluid within the left mastoid air cells. Electronically Signed   By: Jackey Loge D.O.   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 ***  -----------------------------------  Billie Lade, PhD, MD  This document serves as a record of services personally performed by Antony Blackbird, MD. It was created on his behalf by Neena Rhymes, a trained medical scribe. The creation of this record is based on the scribe's personal observations and the provider's statements to them. This document has been checked and approved by the attending provider.

## 2023-03-11 ENCOUNTER — Encounter: Payer: Self-pay | Admitting: Neurology

## 2023-03-11 ENCOUNTER — Ambulatory Visit
Admission: RE | Admit: 2023-03-11 | Discharge: 2023-03-11 | Disposition: A | Discharge status: Home / Self Care | Payer: Medicare HMO | Source: Ambulatory Visit | Attending: Radiation Oncology | Admitting: Radiation Oncology

## 2023-03-11 ENCOUNTER — Ambulatory Visit: Admission: RE | Admit: 2023-03-11 | Payer: Medicare HMO | Source: Ambulatory Visit | Admitting: Radiation Oncology

## 2023-03-11 ENCOUNTER — Encounter: Payer: Self-pay | Admitting: Radiation Oncology

## 2023-03-11 ENCOUNTER — Encounter: Payer: Medicare HMO | Admitting: Registered Nurse

## 2023-03-11 VITALS — BP 158/101 | HR 89 | Temp 97.5°F | Resp 18 | Ht 67.75 in

## 2023-03-11 DIAGNOSIS — I700 Atherosclerosis of aorta: Secondary | ICD-10-CM | POA: Diagnosis not present

## 2023-03-11 DIAGNOSIS — D0512 Intraductal carcinoma in situ of left breast: Secondary | ICD-10-CM

## 2023-03-11 DIAGNOSIS — Z7901 Long term (current) use of anticoagulants: Secondary | ICD-10-CM | POA: Insufficient documentation

## 2023-03-11 DIAGNOSIS — G40909 Epilepsy, unspecified, not intractable, without status epilepticus: Secondary | ICD-10-CM | POA: Diagnosis not present

## 2023-03-11 DIAGNOSIS — J432 Centrilobular emphysema: Secondary | ICD-10-CM | POA: Diagnosis not present

## 2023-03-11 DIAGNOSIS — Z510 Encounter for antineoplastic radiation therapy: Secondary | ICD-10-CM | POA: Insufficient documentation

## 2023-03-11 DIAGNOSIS — C50412 Malignant neoplasm of upper-outer quadrant of left female breast: Secondary | ICD-10-CM | POA: Insufficient documentation

## 2023-03-11 DIAGNOSIS — Z79899 Other long term (current) drug therapy: Secondary | ICD-10-CM | POA: Diagnosis not present

## 2023-03-11 DIAGNOSIS — Z170 Estrogen receptor positive status [ER+]: Secondary | ICD-10-CM

## 2023-03-12 DIAGNOSIS — G40219 Localization-related (focal) (partial) symptomatic epilepsy and epileptic syndromes with complex partial seizures, intractable, without status epilepticus: Secondary | ICD-10-CM | POA: Diagnosis not present

## 2023-03-12 DIAGNOSIS — Z9181 History of falling: Secondary | ICD-10-CM | POA: Diagnosis not present

## 2023-03-12 DIAGNOSIS — G5793 Unspecified mononeuropathy of bilateral lower limbs: Secondary | ICD-10-CM | POA: Diagnosis not present

## 2023-03-12 DIAGNOSIS — M48061 Spinal stenosis, lumbar region without neurogenic claudication: Secondary | ICD-10-CM | POA: Diagnosis not present

## 2023-03-12 DIAGNOSIS — Z87891 Personal history of nicotine dependence: Secondary | ICD-10-CM | POA: Diagnosis not present

## 2023-03-12 DIAGNOSIS — J4489 Other specified chronic obstructive pulmonary disease: Secondary | ICD-10-CM | POA: Diagnosis not present

## 2023-03-12 DIAGNOSIS — N390 Urinary tract infection, site not specified: Secondary | ICD-10-CM | POA: Diagnosis not present

## 2023-03-12 DIAGNOSIS — G9389 Other specified disorders of brain: Secondary | ICD-10-CM | POA: Diagnosis not present

## 2023-03-13 ENCOUNTER — Telehealth: Payer: Self-pay | Admitting: Neurology

## 2023-03-13 NOTE — Telephone Encounter (Signed)
Left message with the after hour service on 03-12-23 at 3:25 pm    Caller states that she needs order for OT to be moved out until next week per patient request

## 2023-03-13 NOTE — Telephone Encounter (Signed)
Left message with the after hour service on 03-12-23 at 2:04 pm   Caller states that the patient is re certified for 1x a week for 7 days. They are needing a verbal to start

## 2023-03-13 NOTE — Telephone Encounter (Signed)
Marcelino Duster called back with OT orders to be moved back until next week  Tried to call monique back no answer will try to call her back with PT orders at a later time

## 2023-03-13 NOTE — Telephone Encounter (Signed)
[name] called left a voice mail ok for patient is re certified for 1x a week for 7 days

## 2023-03-15 DIAGNOSIS — Z510 Encounter for antineoplastic radiation therapy: Secondary | ICD-10-CM | POA: Diagnosis not present

## 2023-03-15 DIAGNOSIS — Z170 Estrogen receptor positive status [ER+]: Secondary | ICD-10-CM | POA: Diagnosis not present

## 2023-03-15 DIAGNOSIS — C50412 Malignant neoplasm of upper-outer quadrant of left female breast: Secondary | ICD-10-CM | POA: Diagnosis not present

## 2023-03-17 ENCOUNTER — Encounter: Payer: Self-pay | Admitting: *Deleted

## 2023-03-17 DIAGNOSIS — Z170 Estrogen receptor positive status [ER+]: Secondary | ICD-10-CM

## 2023-03-18 ENCOUNTER — Other Ambulatory Visit: Payer: Self-pay

## 2023-03-18 ENCOUNTER — Ambulatory Visit
Admission: RE | Admit: 2023-03-18 | Discharge: 2023-03-18 | Disposition: A | Discharge status: Home / Self Care | Payer: Medicare HMO | Source: Ambulatory Visit | Attending: Radiation Oncology | Admitting: Radiation Oncology

## 2023-03-18 DIAGNOSIS — Z510 Encounter for antineoplastic radiation therapy: Secondary | ICD-10-CM | POA: Diagnosis not present

## 2023-03-18 DIAGNOSIS — C50412 Malignant neoplasm of upper-outer quadrant of left female breast: Secondary | ICD-10-CM | POA: Diagnosis not present

## 2023-03-18 DIAGNOSIS — Z170 Estrogen receptor positive status [ER+]: Secondary | ICD-10-CM | POA: Diagnosis not present

## 2023-03-18 LAB — RAD ONC ARIA SESSION SUMMARY
Course Elapsed Days: 0
Plan Fractions Treated to Date: 1
Plan Prescribed Dose Per Fraction: 2.67 Gy
Plan Total Fractions Prescribed: 15
Plan Total Prescribed Dose: 40.05 Gy
Reference Point Dosage Given to Date: 2.67 Gy
Reference Point Session Dosage Given: 2.67 Gy
Session Number: 1

## 2023-03-19 ENCOUNTER — Other Ambulatory Visit: Payer: Self-pay

## 2023-03-19 ENCOUNTER — Ambulatory Visit
Admission: RE | Admit: 2023-03-19 | Discharge: 2023-03-19 | Disposition: A | Discharge status: Home / Self Care | Payer: Medicare HMO | Source: Ambulatory Visit | Attending: Radiation Oncology | Admitting: Radiation Oncology

## 2023-03-19 DIAGNOSIS — Z170 Estrogen receptor positive status [ER+]: Secondary | ICD-10-CM | POA: Diagnosis not present

## 2023-03-19 DIAGNOSIS — C50412 Malignant neoplasm of upper-outer quadrant of left female breast: Secondary | ICD-10-CM | POA: Diagnosis not present

## 2023-03-19 DIAGNOSIS — Z510 Encounter for antineoplastic radiation therapy: Secondary | ICD-10-CM | POA: Diagnosis not present

## 2023-03-19 LAB — RAD ONC ARIA SESSION SUMMARY
Course Elapsed Days: 1
Plan Fractions Treated to Date: 2
Plan Prescribed Dose Per Fraction: 2.67 Gy
Plan Total Fractions Prescribed: 15
Plan Total Prescribed Dose: 40.05 Gy
Reference Point Dosage Given to Date: 5.34 Gy
Reference Point Session Dosage Given: 2.67 Gy
Session Number: 2

## 2023-03-20 ENCOUNTER — Other Ambulatory Visit: Payer: Self-pay

## 2023-03-20 ENCOUNTER — Ambulatory Visit: Admission: RE | Admit: 2023-03-20 | Payer: Medicare HMO | Source: Ambulatory Visit

## 2023-03-20 DIAGNOSIS — I480 Paroxysmal atrial fibrillation: Secondary | ICD-10-CM | POA: Diagnosis not present

## 2023-03-20 DIAGNOSIS — Z9181 History of falling: Secondary | ICD-10-CM | POA: Diagnosis not present

## 2023-03-20 DIAGNOSIS — D630 Anemia in neoplastic disease: Secondary | ICD-10-CM | POA: Diagnosis not present

## 2023-03-20 DIAGNOSIS — Z87891 Personal history of nicotine dependence: Secondary | ICD-10-CM | POA: Diagnosis not present

## 2023-03-20 DIAGNOSIS — Z8616 Personal history of COVID-19: Secondary | ICD-10-CM | POA: Diagnosis not present

## 2023-03-20 DIAGNOSIS — F419 Anxiety disorder, unspecified: Secondary | ICD-10-CM | POA: Diagnosis not present

## 2023-03-20 DIAGNOSIS — K5900 Constipation, unspecified: Secondary | ICD-10-CM | POA: Diagnosis not present

## 2023-03-20 DIAGNOSIS — M48061 Spinal stenosis, lumbar region without neurogenic claudication: Secondary | ICD-10-CM | POA: Diagnosis not present

## 2023-03-20 DIAGNOSIS — I82A11 Acute embolism and thrombosis of right axillary vein: Secondary | ICD-10-CM | POA: Diagnosis not present

## 2023-03-20 DIAGNOSIS — Z510 Encounter for antineoplastic radiation therapy: Secondary | ICD-10-CM | POA: Diagnosis not present

## 2023-03-20 DIAGNOSIS — I10 Essential (primary) hypertension: Secondary | ICD-10-CM | POA: Diagnosis not present

## 2023-03-20 DIAGNOSIS — Z7901 Long term (current) use of anticoagulants: Secondary | ICD-10-CM | POA: Diagnosis not present

## 2023-03-20 DIAGNOSIS — G9389 Other specified disorders of brain: Secondary | ICD-10-CM | POA: Diagnosis not present

## 2023-03-20 DIAGNOSIS — J4489 Other specified chronic obstructive pulmonary disease: Secondary | ICD-10-CM | POA: Diagnosis not present

## 2023-03-20 DIAGNOSIS — G5793 Unspecified mononeuropathy of bilateral lower limbs: Secondary | ICD-10-CM | POA: Diagnosis not present

## 2023-03-20 DIAGNOSIS — Z170 Estrogen receptor positive status [ER+]: Secondary | ICD-10-CM | POA: Diagnosis not present

## 2023-03-20 DIAGNOSIS — G40219 Localization-related (focal) (partial) symptomatic epilepsy and epileptic syndromes with complex partial seizures, intractable, without status epilepticus: Secondary | ICD-10-CM | POA: Diagnosis not present

## 2023-03-20 DIAGNOSIS — C50412 Malignant neoplasm of upper-outer quadrant of left female breast: Secondary | ICD-10-CM | POA: Diagnosis not present

## 2023-03-20 LAB — RAD ONC ARIA SESSION SUMMARY
Course Elapsed Days: 2
Plan Fractions Treated to Date: 3
Plan Prescribed Dose Per Fraction: 2.67 Gy
Plan Total Fractions Prescribed: 15
Plan Total Prescribed Dose: 40.05 Gy
Reference Point Dosage Given to Date: 8.01 Gy
Reference Point Session Dosage Given: 2.67 Gy
Session Number: 3

## 2023-03-22 IMAGING — CR DG HIP (WITH OR WITHOUT PELVIS) 2-3V*L*
3 series · 3 of 3 positions shown · non-contrast
Comparison: None.

CLINICAL DATA: Recent seizure activity with fall and left hip pain,
initial encounter

EXAM:
DG HIP (WITH OR WITHOUT PELVIS) 3V LEFT

[x pelvis]
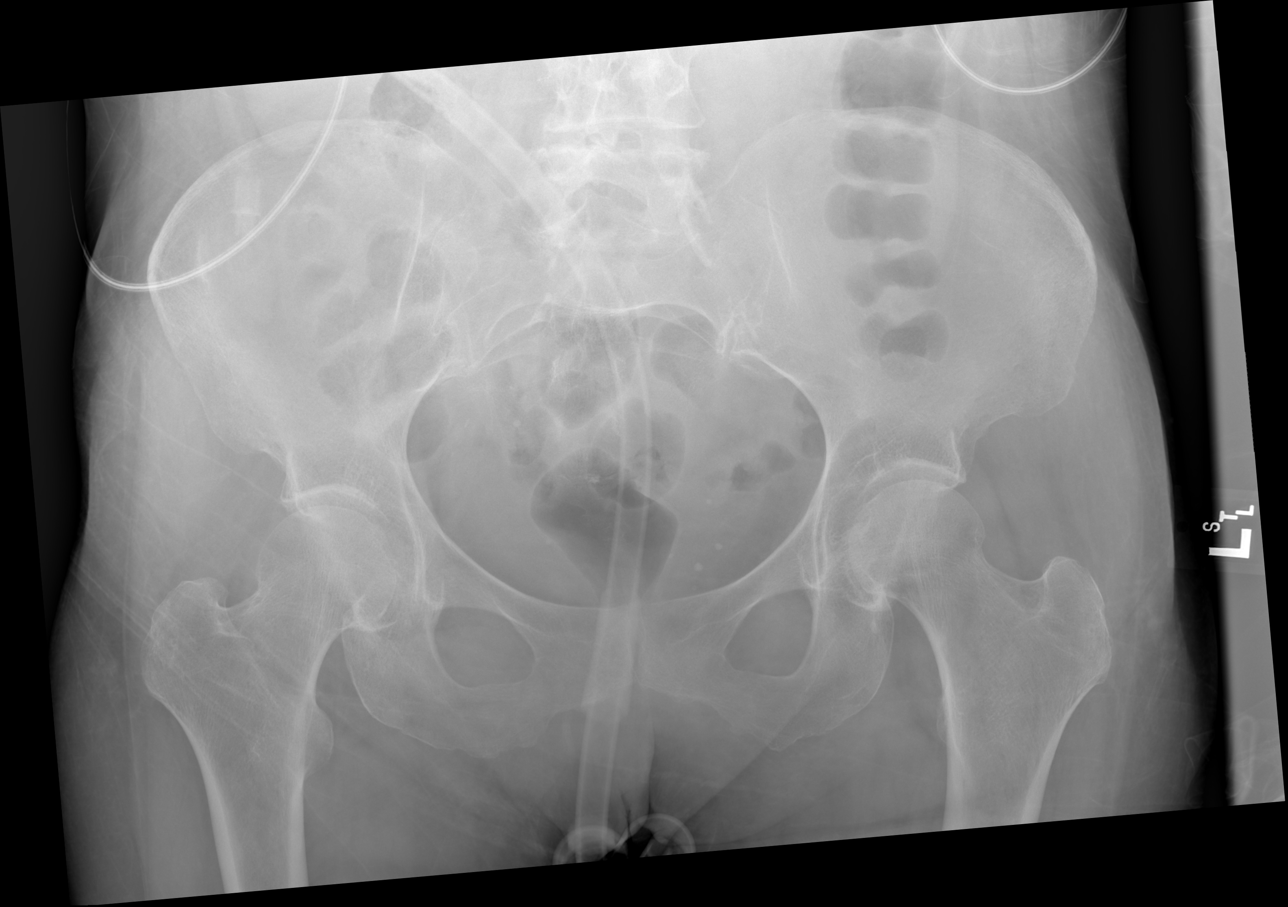

[x hip ap left]
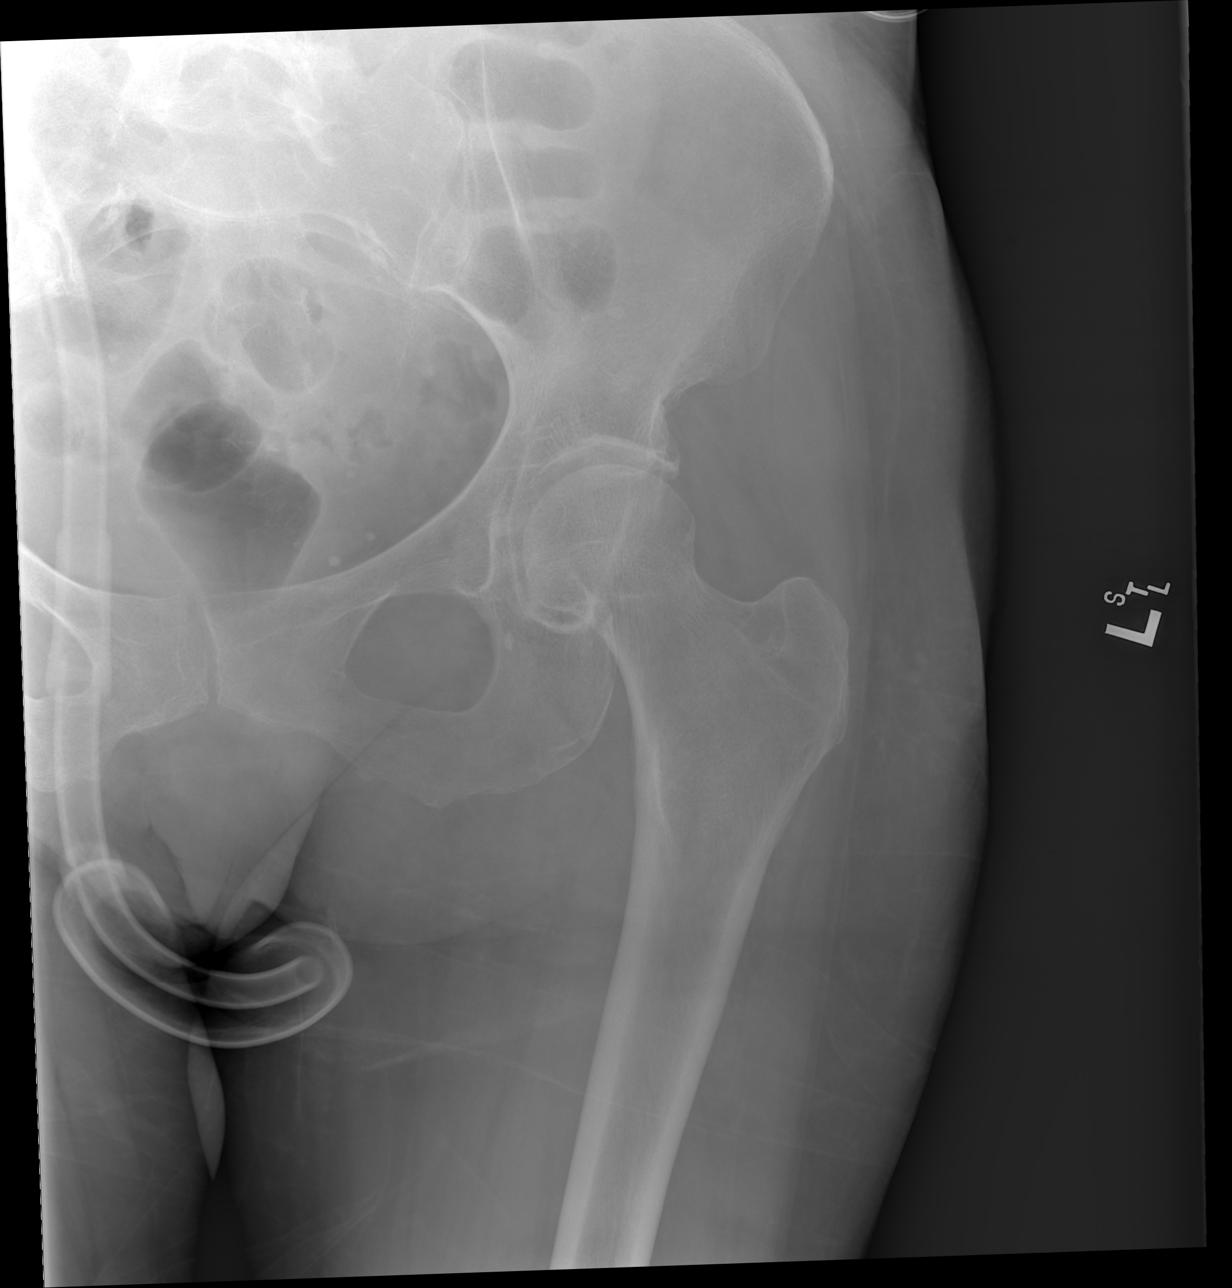

[x hip lat left]
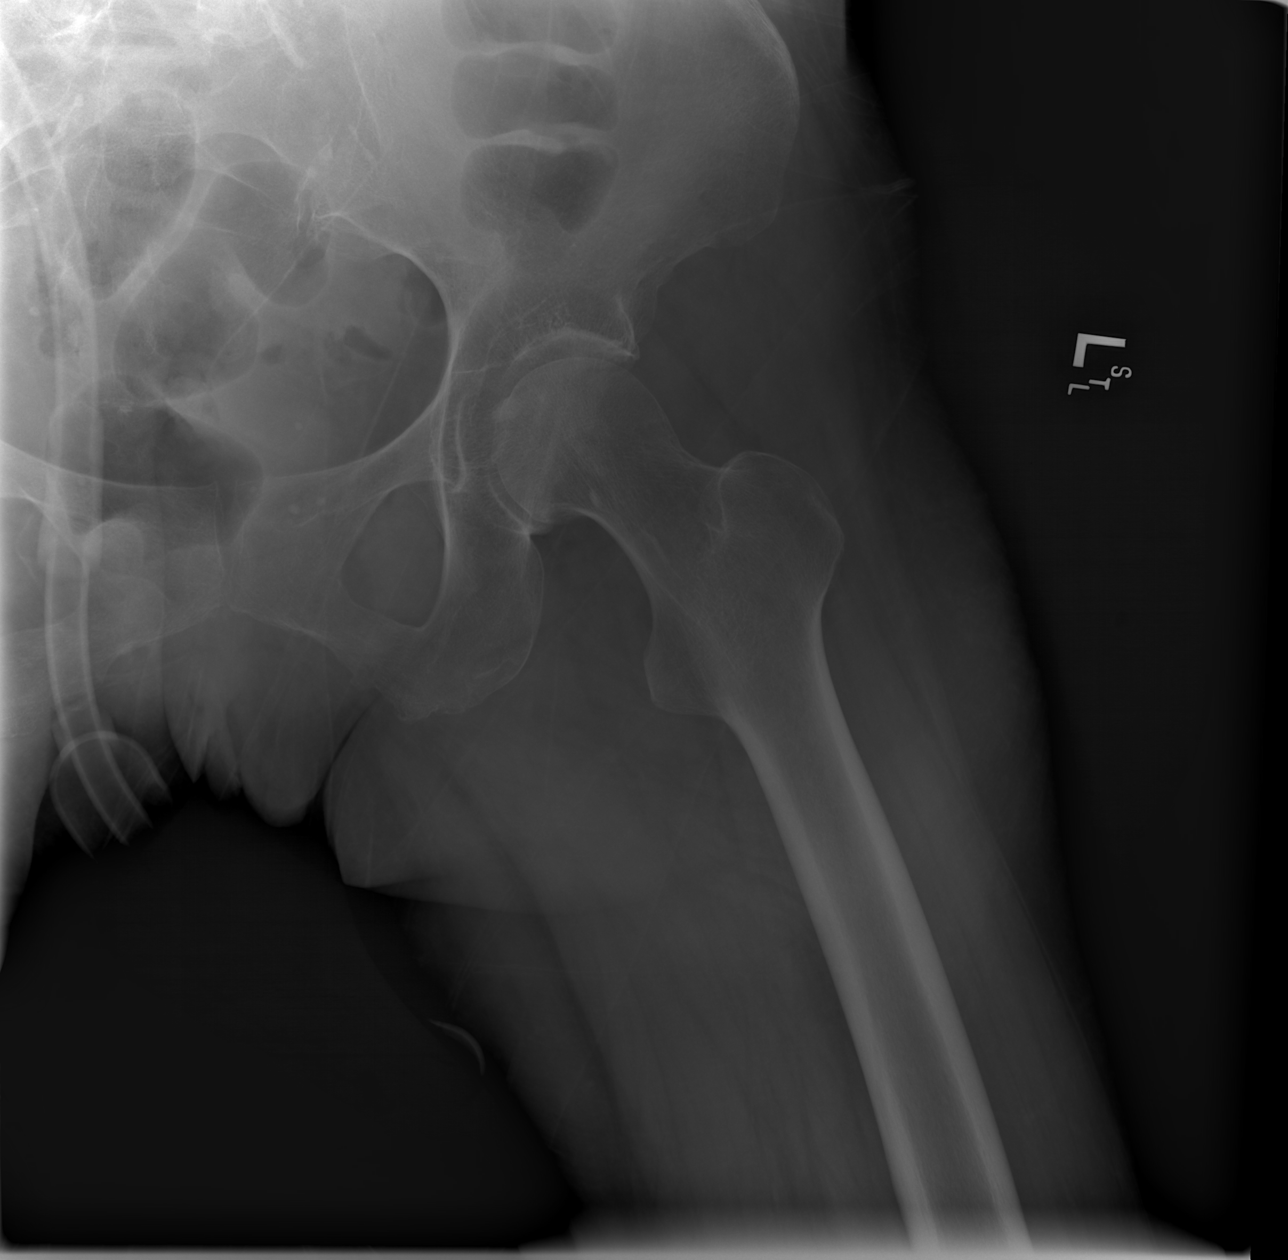

[3 of 3 positions shown; findings below may reference images not displayed]

FINDINGS: Pelvic ring is intact. Minimal degenerative changes of the hip
joints are noted bilaterally. No acute fracture or dislocation is
noted. No soft tissue abnormality is seen.
IMPRESSION: No acute abnormality noted.

## 2023-03-22 IMAGING — CR DG KNEE COMPLETE 4+V*L*
4 series · 4 of 4 positions shown · non-contrast
Comparison: None.

CLINICAL DATA: Recent fall following seizure activity with left
knee pain, initial encounter

EXAM:
LEFT KNEE - COMPLETE 4+ VIEW

[x knee ap left (1 of 4)]
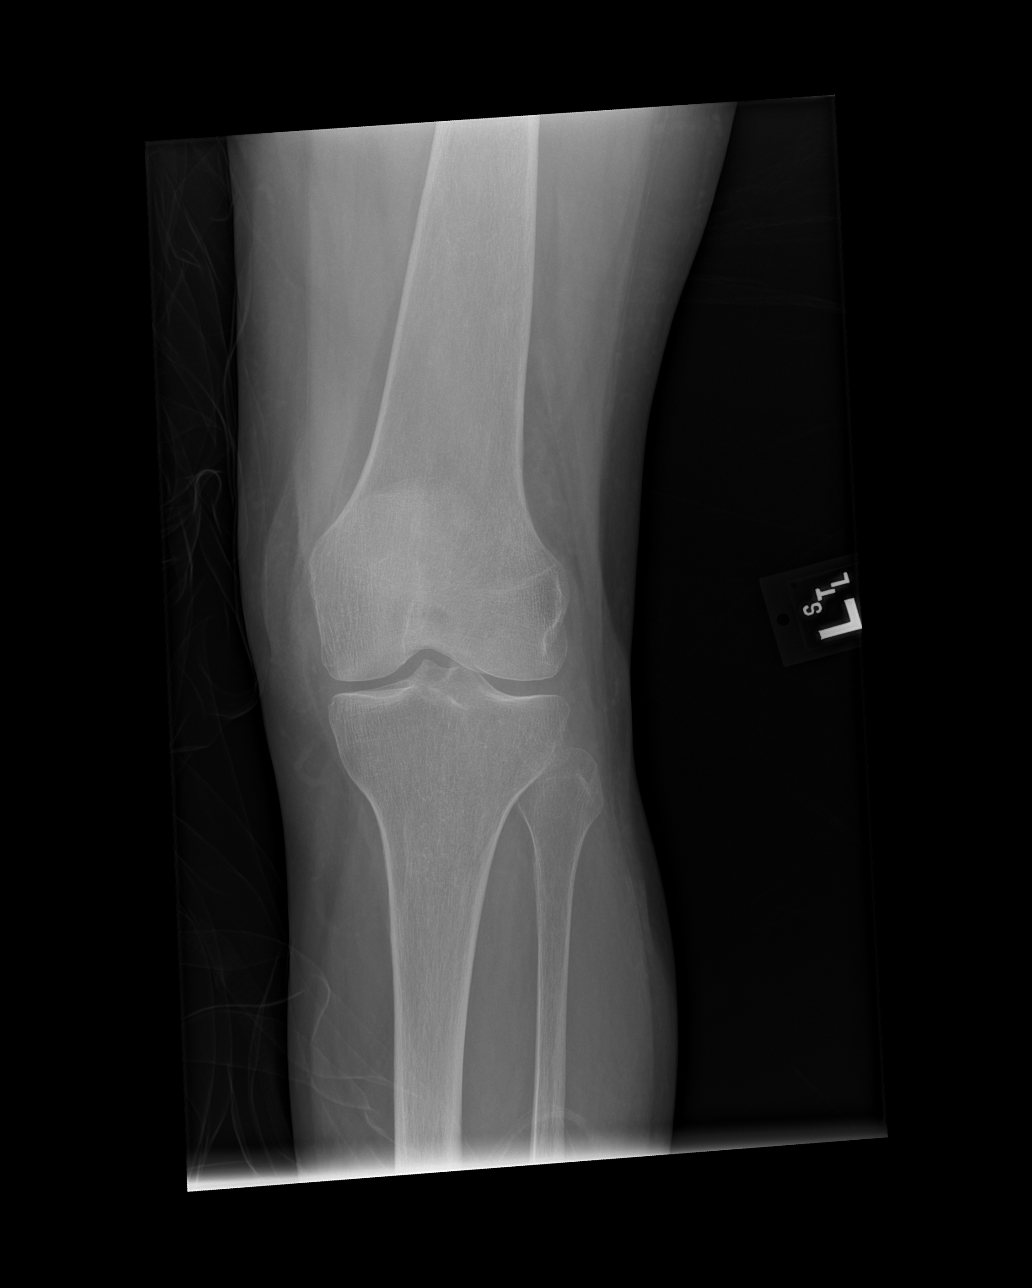

[x knee ap left (2 of 4)]
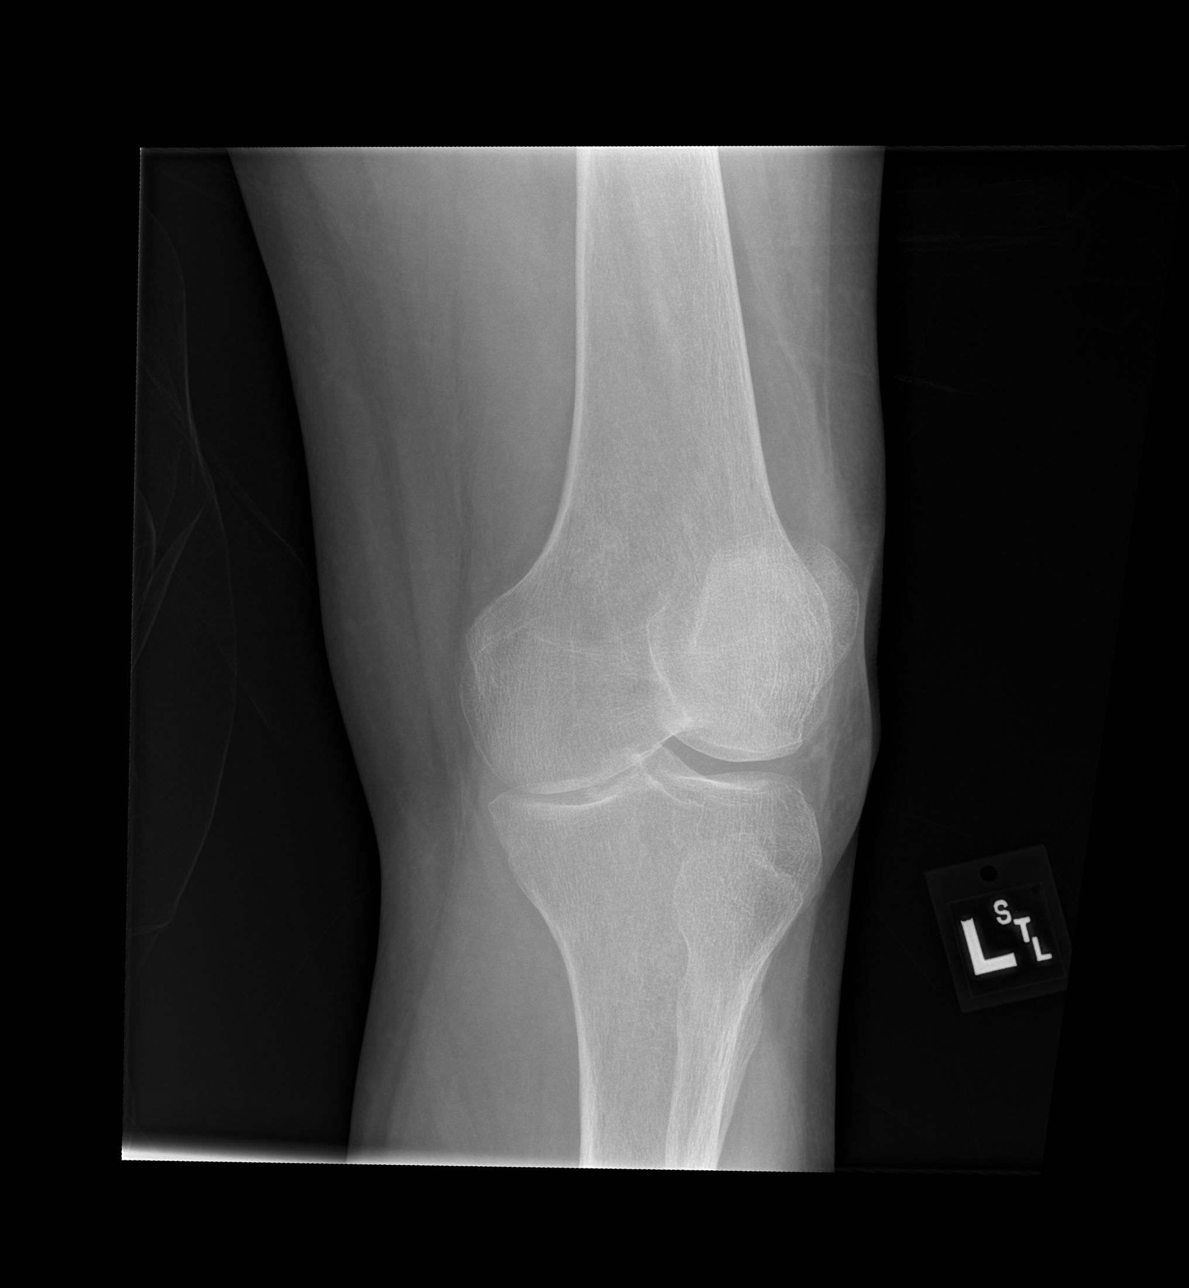

[x knee ap left (3 of 4)]
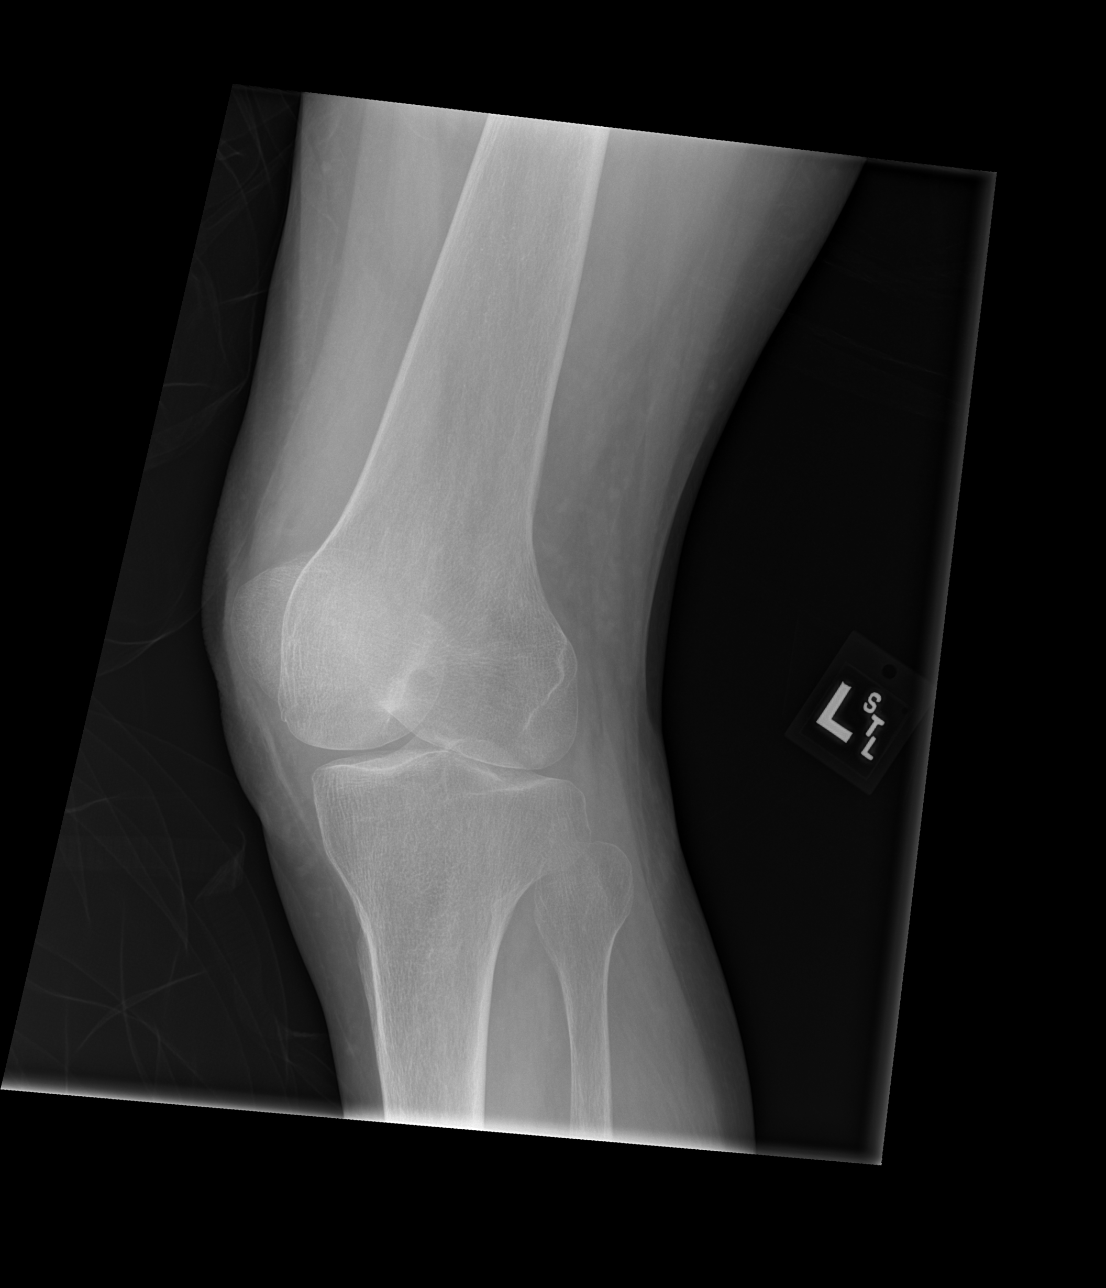

[x knee ap left (4 of 4)]
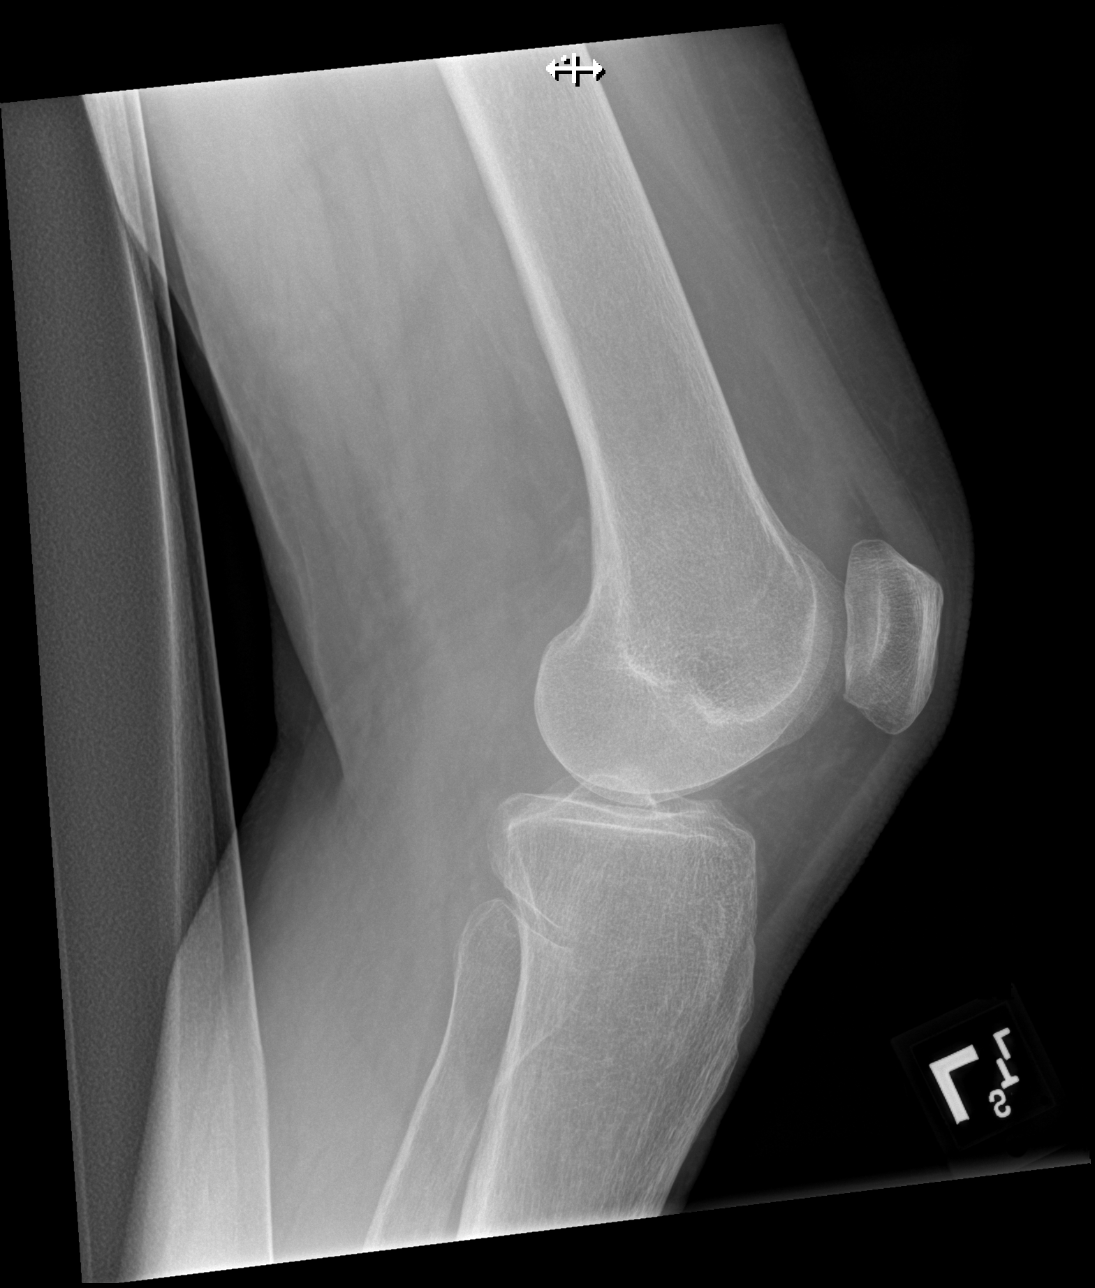

[4 of 4 positions shown; findings below may reference images not displayed]

FINDINGS: No evidence of fracture, dislocation, or joint effusion. No evidence
of arthropathy or other focal bone abnormality. Soft tissues are
unremarkable.
IMPRESSION: No acute abnormality noted.

## 2023-03-23 ENCOUNTER — Other Ambulatory Visit: Payer: Self-pay

## 2023-03-23 ENCOUNTER — Ambulatory Visit: Payer: Medicare HMO

## 2023-03-23 ENCOUNTER — Ambulatory Visit: Admission: RE | Admit: 2023-03-23 | Payer: Medicare HMO | Source: Ambulatory Visit

## 2023-03-23 DIAGNOSIS — Z170 Estrogen receptor positive status [ER+]: Secondary | ICD-10-CM | POA: Diagnosis not present

## 2023-03-23 DIAGNOSIS — C50412 Malignant neoplasm of upper-outer quadrant of left female breast: Secondary | ICD-10-CM | POA: Diagnosis not present

## 2023-03-23 DIAGNOSIS — Z510 Encounter for antineoplastic radiation therapy: Secondary | ICD-10-CM | POA: Diagnosis not present

## 2023-03-23 LAB — RAD ONC ARIA SESSION SUMMARY
Course Elapsed Days: 5
Plan Fractions Treated to Date: 4
Plan Prescribed Dose Per Fraction: 2.67 Gy
Plan Total Fractions Prescribed: 15
Plan Total Prescribed Dose: 40.05 Gy
Reference Point Dosage Given to Date: 10.68 Gy
Reference Point Session Dosage Given: 2.67 Gy
Session Number: 4

## 2023-03-24 ENCOUNTER — Ambulatory Visit
Admission: RE | Admit: 2023-03-24 | Discharge: 2023-03-24 | Disposition: A | Discharge status: Home / Self Care | Payer: Medicare HMO | Source: Ambulatory Visit | Attending: Radiation Oncology | Admitting: Radiation Oncology

## 2023-03-24 ENCOUNTER — Other Ambulatory Visit: Payer: Self-pay

## 2023-03-24 ENCOUNTER — Ambulatory Visit: Admission: RE | Admit: 2023-03-24 | Payer: Medicare HMO | Source: Ambulatory Visit

## 2023-03-24 DIAGNOSIS — K5900 Constipation, unspecified: Secondary | ICD-10-CM | POA: Diagnosis not present

## 2023-03-24 DIAGNOSIS — Z510 Encounter for antineoplastic radiation therapy: Secondary | ICD-10-CM | POA: Diagnosis not present

## 2023-03-24 DIAGNOSIS — F419 Anxiety disorder, unspecified: Secondary | ICD-10-CM | POA: Diagnosis not present

## 2023-03-24 DIAGNOSIS — Z7901 Long term (current) use of anticoagulants: Secondary | ICD-10-CM | POA: Diagnosis not present

## 2023-03-24 DIAGNOSIS — Z8616 Personal history of COVID-19: Secondary | ICD-10-CM | POA: Diagnosis not present

## 2023-03-24 DIAGNOSIS — G40219 Localization-related (focal) (partial) symptomatic epilepsy and epileptic syndromes with complex partial seizures, intractable, without status epilepticus: Secondary | ICD-10-CM | POA: Diagnosis not present

## 2023-03-24 DIAGNOSIS — C50412 Malignant neoplasm of upper-outer quadrant of left female breast: Secondary | ICD-10-CM

## 2023-03-24 DIAGNOSIS — D630 Anemia in neoplastic disease: Secondary | ICD-10-CM | POA: Diagnosis not present

## 2023-03-24 DIAGNOSIS — Z9181 History of falling: Secondary | ICD-10-CM | POA: Diagnosis not present

## 2023-03-24 DIAGNOSIS — G5793 Unspecified mononeuropathy of bilateral lower limbs: Secondary | ICD-10-CM | POA: Diagnosis not present

## 2023-03-24 DIAGNOSIS — J4489 Other specified chronic obstructive pulmonary disease: Secondary | ICD-10-CM | POA: Diagnosis not present

## 2023-03-24 DIAGNOSIS — Z170 Estrogen receptor positive status [ER+]: Secondary | ICD-10-CM | POA: Diagnosis not present

## 2023-03-24 DIAGNOSIS — G9389 Other specified disorders of brain: Secondary | ICD-10-CM | POA: Diagnosis not present

## 2023-03-24 DIAGNOSIS — Z87891 Personal history of nicotine dependence: Secondary | ICD-10-CM | POA: Diagnosis not present

## 2023-03-24 DIAGNOSIS — I82A11 Acute embolism and thrombosis of right axillary vein: Secondary | ICD-10-CM | POA: Diagnosis not present

## 2023-03-24 DIAGNOSIS — M48061 Spinal stenosis, lumbar region without neurogenic claudication: Secondary | ICD-10-CM | POA: Diagnosis not present

## 2023-03-24 DIAGNOSIS — I480 Paroxysmal atrial fibrillation: Secondary | ICD-10-CM | POA: Diagnosis not present

## 2023-03-24 DIAGNOSIS — I10 Essential (primary) hypertension: Secondary | ICD-10-CM | POA: Diagnosis not present

## 2023-03-24 LAB — RAD ONC ARIA SESSION SUMMARY
Course Elapsed Days: 6
Plan Fractions Treated to Date: 5
Plan Prescribed Dose Per Fraction: 2.67 Gy
Plan Total Fractions Prescribed: 15
Plan Total Prescribed Dose: 40.05 Gy
Reference Point Dosage Given to Date: 13.35 Gy
Reference Point Session Dosage Given: 2.67 Gy
Session Number: 5

## 2023-03-24 MED ORDER — RADIAPLEXRX EX GEL: Freq: Once | CUTANEOUS | Status: AC

## 2023-03-24 MED ORDER — ALRA NON-METALLIC DEODORANT (RAD-ONC)
1.0000 | Freq: Once | TOPICAL | Status: AC
  Administered 2023-03-24: 1 via TOPICAL

## 2023-03-25 ENCOUNTER — Ambulatory Visit: Payer: Medicare HMO

## 2023-03-25 ENCOUNTER — Telehealth: Payer: Self-pay | Admitting: Radiation Oncology

## 2023-03-25 ENCOUNTER — Emergency Department (HOSPITAL_COMMUNITY): Payer: Medicare HMO

## 2023-03-25 ENCOUNTER — Emergency Department (HOSPITAL_COMMUNITY)
Admission: EM | Admit: 2023-03-25 | Discharge: 2023-03-25 | Disposition: A | Discharge status: Home / Self Care | Payer: Medicare HMO | Attending: Emergency Medicine | Admitting: Emergency Medicine

## 2023-03-25 ENCOUNTER — Other Ambulatory Visit: Payer: Self-pay

## 2023-03-25 DIAGNOSIS — R9089 Other abnormal findings on diagnostic imaging of central nervous system: Secondary | ICD-10-CM | POA: Diagnosis not present

## 2023-03-25 DIAGNOSIS — M4310 Spondylolisthesis, site unspecified: Secondary | ICD-10-CM | POA: Diagnosis not present

## 2023-03-25 DIAGNOSIS — W01198A Fall on same level from slipping, tripping and stumbling with subsequent striking against other object, initial encounter: Secondary | ICD-10-CM | POA: Insufficient documentation

## 2023-03-25 DIAGNOSIS — Z86718 Personal history of other venous thrombosis and embolism: Secondary | ICD-10-CM | POA: Diagnosis not present

## 2023-03-25 DIAGNOSIS — R9431 Abnormal electrocardiogram [ECG] [EKG]: Secondary | ICD-10-CM | POA: Diagnosis not present

## 2023-03-25 DIAGNOSIS — R202 Paresthesia of skin: Secondary | ICD-10-CM | POA: Diagnosis not present

## 2023-03-25 DIAGNOSIS — R062 Wheezing: Secondary | ICD-10-CM | POA: Diagnosis not present

## 2023-03-25 DIAGNOSIS — M4855XD Collapsed vertebra, not elsewhere classified, thoracolumbar region, subsequent encounter for fracture with routine healing: Secondary | ICD-10-CM | POA: Diagnosis not present

## 2023-03-25 DIAGNOSIS — G9389 Other specified disorders of brain: Secondary | ICD-10-CM | POA: Diagnosis not present

## 2023-03-25 DIAGNOSIS — S0993XA Unspecified injury of face, initial encounter: Secondary | ICD-10-CM | POA: Diagnosis not present

## 2023-03-25 DIAGNOSIS — S0083XA Contusion of other part of head, initial encounter: Secondary | ICD-10-CM | POA: Diagnosis not present

## 2023-03-25 DIAGNOSIS — M48061 Spinal stenosis, lumbar region without neurogenic claudication: Secondary | ICD-10-CM | POA: Diagnosis not present

## 2023-03-25 DIAGNOSIS — S199XXA Unspecified injury of neck, initial encounter: Secondary | ICD-10-CM | POA: Diagnosis not present

## 2023-03-25 DIAGNOSIS — Z79899 Other long term (current) drug therapy: Secondary | ICD-10-CM | POA: Diagnosis not present

## 2023-03-25 DIAGNOSIS — S0990XA Unspecified injury of head, initial encounter: Secondary | ICD-10-CM | POA: Diagnosis not present

## 2023-03-25 DIAGNOSIS — I6782 Cerebral ischemia: Secondary | ICD-10-CM | POA: Diagnosis not present

## 2023-03-25 DIAGNOSIS — G40909 Epilepsy, unspecified, not intractable, without status epilepticus: Secondary | ICD-10-CM | POA: Diagnosis not present

## 2023-03-25 DIAGNOSIS — Z7901 Long term (current) use of anticoagulants: Secondary | ICD-10-CM | POA: Insufficient documentation

## 2023-03-25 DIAGNOSIS — Z853 Personal history of malignant neoplasm of breast: Secondary | ICD-10-CM | POA: Diagnosis not present

## 2023-03-25 DIAGNOSIS — R569 Unspecified convulsions: Secondary | ICD-10-CM

## 2023-03-25 DIAGNOSIS — R262 Difficulty in walking, not elsewhere classified: Secondary | ICD-10-CM | POA: Insufficient documentation

## 2023-03-25 DIAGNOSIS — I4892 Unspecified atrial flutter: Secondary | ICD-10-CM | POA: Diagnosis not present

## 2023-03-25 DIAGNOSIS — S0101XA Laceration without foreign body of scalp, initial encounter: Secondary | ICD-10-CM | POA: Diagnosis not present

## 2023-03-25 DIAGNOSIS — M47816 Spondylosis without myelopathy or radiculopathy, lumbar region: Secondary | ICD-10-CM | POA: Diagnosis not present

## 2023-03-25 LAB — URINALYSIS, ROUTINE W REFLEX MICROSCOPIC
Bilirubin Urine: NEGATIVE
Glucose, UA: NEGATIVE mg/dL
Hgb urine dipstick: NEGATIVE
Ketones, ur: NEGATIVE mg/dL
Leukocytes,Ua: NEGATIVE
Nitrite: NEGATIVE
Protein, ur: NEGATIVE mg/dL
Specific Gravity, Urine: 1.017 (ref 1.005–1.030)
pH: 7 (ref 5.0–8.0)

## 2023-03-25 LAB — COMPREHENSIVE METABOLIC PANEL
ALT: 13 U/L (ref 0–44)
AST: 16 U/L (ref 15–41)
Albumin: 3.8 g/dL (ref 3.5–5.0)
Alkaline Phosphatase: 64 U/L (ref 38–126)
Anion gap: 9 (ref 5–15)
BUN: 8 mg/dL (ref 8–23)
CO2: 24 mmol/L (ref 22–32)
Calcium: 8.9 mg/dL (ref 8.9–10.3)
Chloride: 107 mmol/L (ref 98–111)
Creatinine, Ser: 0.77 mg/dL (ref 0.44–1.00)
GFR, Estimated: >60 mL/min (ref >60)
Glucose, Bld: 106 mg/dL — ABNORMAL HIGH (ref 70–99)
Potassium: 3.8 mmol/L (ref 3.5–5.1)
Sodium: 140 mmol/L (ref 135–145)
Total Bilirubin: 0.5 mg/dL (ref 0.3–1.2)
Total Protein: 6.7 g/dL (ref 6.5–8.1)

## 2023-03-25 LAB — RAPID URINE DRUG SCREEN, HOSP PERFORMED
Amphetamines: NOT DETECTED
Barbiturates: NOT DETECTED
Benzodiazepines: NOT DETECTED
Cocaine: NOT DETECTED
Opiates: NOT DETECTED
Tetrahydrocannabinol: NOT DETECTED

## 2023-03-25 LAB — PROTIME-INR
INR: 2.2 — ABNORMAL HIGH (ref 0.8–1.2)
Prothrombin Time: 24.5 seconds — ABNORMAL HIGH (ref 11.4–15.2)

## 2023-03-25 LAB — CBC
HCT: 41.6 % (ref 36.0–46.0)
Hemoglobin: 13.1 g/dL (ref 12.0–15.0)
MCH: 32.9 pg (ref 26.0–34.0)
MCHC: 31.5 g/dL (ref 30.0–36.0)
MCV: 104.5 fL — ABNORMAL HIGH (ref 80.0–100.0)
Platelets: 109 10*3/uL — ABNORMAL LOW (ref 150–400)
RBC: 3.98 MIL/uL (ref 3.87–5.11)
RDW: 13.2 % (ref 11.5–15.5)
WBC: 2.9 10*3/uL — ABNORMAL LOW (ref 4.0–10.5)
nRBC: 0 % (ref 0.0–0.2)

## 2023-03-25 LAB — PHENYTOIN LEVEL, TOTAL: Phenytoin Lvl: 14.8 ug/mL (ref 10.0–20.0)

## 2023-03-25 NOTE — ED Provider Notes (Signed)
[place] EMERGENCY DEPARTMENT AT [facility] Provider Note   CSN: [id number] Arrival date & time: 03/25/23  4098     History  Chief Complaint  Patient presents with   Seizures   fall on thinners    [name] is a 67 y.o. female.  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  Her daughter states that she had 2 seizures yesterday.  These were her typical grand mal seizures with some postictal period.  She reports that her mother came back to baseline.  However she began complaining that her feet were numb and had difficulty walking.  She reports that her mother got up this morning came down the stairs at her lift and was able to get into the kitchen and have coffee.  However when the daughter found that she had fallen and struck her head, she attempted to get her to the car in the patient was unable to walk.   67 yo female breast ca, seizure disorder, on warfarin for dvt. Two seizures yesterday last at 6pm. Today began having difficulty walking feels feet are numb and difficulty walking, fell and hit her head with swelling to occiput Last inr      Home Medications Prior to Admission medications   Medication Sig Start Date End Date Taking? Authorizing Provider  acetaminophen (TYLENOL) 325 MG tablet Take 1-2 tablets (325-650 mg total) by mouth every 4 (four) hours as needed for mild pain. 02/27/23   Setzer, Lynnell Jude, PA-C  cyanocobalamin (VITAMIN B12) 1000 MCG tablet Take 2 tablets (2,000 mcg total) by mouth daily. 02/28/23   Setzer, Lynnell Jude, PA-C  diclofenac Sodium (VOLTAREN) 1 % GEL Apply 2 g topically 4 (four) times daily as needed. To top of right foot 02/27/23   Setzer, Lynnell Jude, PA-C  DILANTIN 100 MG ER capsule Take 1 capsule (100 mg total) by mouth 2 (two) times daily. 02/09/23    Van Clines, MD  DILANTIN 30 MG ER capsule TAKE 1 CAPSULE BY MOUTH EVERY  NIGHT ALONG WITH 100 MG CAPSULE  FOR A TOTAL EVERY NIGHT DOSE OF  130 MG Patient taking differently: Take 30 mg by mouth daily. TAKE 1 CAPSULE BY MOUTH EVERY morning ALONG WITH 100 MG CAPSULE  FOR A TOTAL morning DOSE OF 130 MG 02/09/23   Van Clines, MD  famotidine (PEPCID) 20 MG tablet 1 tablet Orally twice a day for 90 days 03/03/23   [provider]  folic acid (FOLVITE) 1 MG tablet Take 1 tablet (1 mg total) by mouth daily. 02/27/23   Setzer, Lynnell Jude, PA-C  gabapentin (NEURONTIN) 100 MG capsule Take 1 capsule (100 mg total) by mouth 3 (three) times daily. 02/27/23   Setzer, Lynnell Jude, PA-C  LORazepam (ATIVAN) 1 MG tablet TAKE 1 TABLET BY MOUTH AS DIRECTED AS NEEDED FOR 3 OR MORE SEIZURES IN 24 HOURS. DO NOT TAKE MORE THAN 2 TABLETS IN 24 HOURS. Strength: 1 mg 02/09/23   Van Clines, MD  oxybutynin (DITROPAN-XL) 5 MG 24 hr tablet Take 1 tablet (5 mg total) by mouth at bedtime. 02/27/23   Setzer, Lynnell Jude, PA-C  potassium chloride SA (KLOR-CON M) 20 MEQ tablet Take 1 tablet (20 mEq total) by mouth every other day. 02/27/23   Setzer, Lynnell Jude, PA-C  topiramate (TOPAMAX) 50  MG tablet Take 1 tablet (50 mg total) by mouth every morning AND 2 tablets (100 mg total) at bedtime. 02/27/23   Setzer, Lynnell Jude, PA-C  VIMPAT 200 MG TABS tablet Take 1 tablet (200 mg total) by mouth 2 (two) times daily. 02/19/23   Leroy Sea, MD  warfarin (COUMADIN) 6 MG tablet Take 1 tablet (6 mg total) by mouth daily at 4 PM. 02/27/23   Setzer, Lynnell Jude, PA-C      Allergies    Zonegran [zonisamide], Chocolate, Codeine, Keppra [levetiracetam], Lyrica [pregabalin], and Olive oil    Review of Systems   Review of Systems  Physical Exam Updated Vital Signs BP (!) 112/55 (BP Location: Left Arm)   Pulse 70   Temp 98.4 F (36.9 C) (Oral)   Resp 16   SpO2 96%  Physical Exam Vitals and nursing note reviewed.  Constitutional:       General: She is not in acute distress.    Comments: Patient with some tremor daughter states is baseline  HENT:     Head: Normocephalic.     Comments: Some swelling occiput consistent with contusion    Right Ear: External ear normal.     Left Ear: External ear normal.     Nose: Nose normal.     Mouth/Throat:     Mouth: Mucous membranes are dry.  Eyes:     Pupils: Pupils are equal, round, and reactive to light.     Comments: Patient with known baseline visual deficit to left visual field  Cardiovascular:     Rate and Rhythm: Normal rate and regular rhythm.  Musculoskeletal:     Cervical back: Normal range of motion.  Neurological:     Mental Status: She is alert.     ED Results / Procedures / Treatments   Labs (all labs ordered are listed, but only abnormal results are displayed) Labs Reviewed  PROTIME-INR - Abnormal; Notable for the following components:      Result Value   Prothrombin Time 24.5 (*)    INR 2.2 (*)    All other components within normal limits  CBC - Abnormal; Notable for the following components:   WBC 2.9 (*)    MCV 104.5 (*)    Platelets 109 (*)    All other components within normal limits  COMPREHENSIVE METABOLIC PANEL - Abnormal; Notable for the following components:   Glucose, Bld 106 (*)    All other components within normal limits  PHENYTOIN LEVEL, TOTAL  URINALYSIS, ROUTINE W REFLEX MICROSCOPIC  RAPID URINE DRUG SCREEN, HOSP PERFORMED    EKG EKG Interpretation Date/Time:  Wednesday March 25 2023 07:53:33 EDT Ventricular Rate:  88 PR Interval:  157 QRS Duration:  88 QT Interval:  368 QTC Calculation: 446 R Axis:   68  Text Interpretation: Sinus rhythm Confirmed by Margarita Grizzle [phone]) on 03/25/2023 8:27:21 AM  Radiology MR [name]  Result Date: 03/25/2023 CLINICAL DATA:  Provided history: Low back pain, cauda equina syndrome suspected. Leg weakness. EXAM: MRI LUMBAR SPINE WITHOUT CONTRAST TECHNIQUE: Multiplanar,  multisequence MR imaging of the lumbar spine was performed. No intravenous contrast was administered. COMPARISON:  Lumbar spine MRI 07/18/2018. MR spine radiographs 12/19/2017. FINDINGS: Segmentation: Five lumbar vertebrae. The caudal-most well-formed intervertebral disc space is designated L5-S1. Alignment: Mild grade 1 retrolisthesis at L1-L2, L2-L3, L3-L4 and L4-L5. Vertebrae: Redemonstrated chronic anterior wedge vertebral compression deformities at T11, T12 and L1. Mild vertebral body height loss at these levels appears unchanged from  the prior MRI of 07/18/2018. Mild degenerative endplate edema at U9-W1. Conus medullaris and cauda equina: Conus extends to the L2 level. No signal abnormality identified within the visualized distal spinal cord. Paraspinal and other soft tissues: No acute finding within included portions of the abdomen/retroperitoneum. No paraspinal mass or collection. Disc levels: Mild-to-moderate disc degeneration at T10-T11, T11-T12 and L4-L5. No more than mild disc degeneration at the remaining lumbar levels. T10-T11: This level is imaged in the sagittal plane only. Facet arthrosis. No significant disc herniation or stenosis. T11-T12: Facet arthrosis. No significant disc herniation or stenosis. T12-L1: Left extraforaminal zone endplate osteophyte. Facet arthrosis. Mild ligamentum flavum hypertrophy. No significant spinal canal or foraminal stenosis. L1-L2: Slight grade 1 retrolisthesis. Ligamentum flavum hypertrophy. No significant disc herniation or stenosis. L2-L3: Slight grade 1 retrolisthesis. Disc bulge. New superimposed broad-based left subarticular to left extraforaminal disc protrusion. Facet and ligamentum flavum hypertrophy. Mild relative left subarticular narrowing, new. No significant central canal stenosis. Mild left neural foraminal narrowing, new. The disc protrusion may contact the exiting left L2 nerve root beyond the neural foramen. L3-L4: Slight grade 1 retrolisthesis. Disc  bulge. Facet and ligamentum flavum hypertrophy. Mild relative bilateral subarticular and central canal narrowing. Mild relative bilateral neural foraminal narrowing, progressed. L4-L5: Mild grade 1 retrolisthesis. Progressive disc bulge. Facet and ligamentum flavum hypertrophy, also progressed. Bilateral subarticular narrowing (mild to moderate right, mild left), progressed (with some crowding of the descending right L5 nerve root). Mild narrowing of the central canal, progressed. Bilateral neural foraminal narrowing (mild to moderate right, minimal left). L5-S1: Mild facet arthrosis and ligamentum flavum hypertrophy. No significant disc herniation or stenosis. IMPRESSION: 1. Spondylosis at the lumbar and visualized lower thoracic levels, as outlined and having progressed at multiple levels since the prior MRI of 07/18/2018. 2. Multilevel subarticular stenosis. Most notably, there is progressive multifactorial mild/moderate right subarticular stenosis at L4-L5 (with some crowding of the descending right L5 nerve root). 3. Multilevel foraminal stenosis, greatest on the right at L4-L5 (mild-to-moderate at this site). 4. Redemonstrated chronic T11, T12 and L1 vertebral compression fractures. 5. Mild degenerative endplate edema at X9-J4. 6. Mild multilevel grade 1 spondylolisthesis. Electronically Signed   By: Jackey Loge D.O.   On: 03/25/2023 16:16   MR BRAIN WO CONTRAST  Result Date: 03/25/2023 CLINICAL DATA:  Provided history: Neuro deficit, acute, stroke suspected. Additional history provided: two witnessed seizures. Fall (hitting head). EXAM: MRI HEAD WITHOUT CONTRAST TECHNIQUE: Multiplanar, multiecho pulse sequences of the brain and surrounding structures were obtained without intravenous contrast. COMPARISON:  Brain MRI 02/16/2023. CT angiogram head/neck 02/18/2023. Head CT 03/25/2023. FINDINGS: Intermittently motion degraded examination (with up to moderate motion degradation of the acquired sequences).  Brain: Mild generalized parenchymal atrophy. Redemonstrated large region of chronic encephalomalacia/gliosis in the right parietal and occipital lobes, as well as callosal splenium. This region of encephalomalacia communicates with the right lateral ventricle. Chronic asymmetric right hippocampal volume loss, unchanged. Minimal T2 FLAIR hyperintense signal abnormality within the left parietal/periatrial white matter, unchanged and nonspecific, but compatible with chronic small vessel ischemic disease. Punctate focus of chronic hemorrhage along the occipital horn of the right lateral ventricle. There is no acute infarct. No evidence of an intracranial mass. No extra-axial fluid collection. No midline shift. Vascular: Maintained flow voids within the proximal large arterial vessels. Skull and upper cervical spine: Right parietooccipital cranioplasty. No focal suspicious marrow lesion. Multilevel facet arthrosis within the imaged cervical spine. Sinuses/Orbits: No mass or acute finding within the imaged orbits. No significant paranasal sinus  disease. Other: Small-volume fluid within the left mastoid air cells. IMPRESSION: 1. Intermittently motion degraded examination. 2. No evidence of an acute intracranial abnormality. 3. Stable non-contrast MRI appearance of the brain as compared to 02/15/2023. Large region of chronic encephalomalacia within the right parietal and occipital lobes, as well as callosal splenium, with chronic right hippocampal volume loss. 4. Mild generalized parenchymal atrophy and cerebral white matter chronic small vessel ischemic disease. 5. Small-volume fluid within the left mastoid air cells. Electronically Signed   By: Jackey Loge D.O.   On: 03/25/2023 13:51   CT Head Wo Contrast  Result Date: 03/25/2023 CLINICAL DATA:  Head trauma, minor (Age >= 65y); Neck trauma (Age >= 65y); Facial trauma, blunt EXAM: CT HEAD WITHOUT CONTRAST CT MAXILLOFACIAL WITHOUT CONTRAST CT CERVICAL SPINE WITHOUT  CONTRAST TECHNIQUE: Multidetector CT imaging of the head, cervical spine, and maxillofacial structures were performed using the standard protocol without intravenous contrast. Multiplanar CT image reconstructions of the cervical spine and maxillofacial structures were also generated. RADIATION DOSE REDUCTION: This exam was performed according to the departmental dose-optimization program which includes automated exposure control, adjustment of the mA and/or kV according to patient size and/or use of iterative reconstruction technique. COMPARISON:  CTA head/neck 02/18/2023. MRI cervical spine 02/16/2023. FINDINGS: CT HEAD FINDINGS Brain: No acute hemorrhage. Stable postoperative changes of prior right parieto-occipital mass resection. No new loss of gray-white differentiation. No hydrocephalus or extra-axial collection. No mass effect or midline shift. Vascular: No hyperdense vessel or unexpected calcification. Skull: Prior right parieto-occipital craniotomy. No acute calvarial fracture. Skull base is unremarkable. Other: Right parietal scalp laceration. CT MAXILLOFACIAL FINDINGS Osseous: No fracture or mandibular dislocation. No destructive process. Orbits: Negative. No traumatic or inflammatory finding. Sinuses: Clear. Soft tissues: Negative. CT CERVICAL SPINE FINDINGS Alignment: Normal. Skull base and vertebrae: No acute fracture. Normal craniocervical junction. No suspicious bone lesions. Soft tissues and spinal canal: No prevertebral fluid or swelling. No visible canal hematoma. Disc levels: Multilevel cervical spondylosis, worst at C5-6 and C6-7, where there is at least mild spinal canal stenosis. Upper chest: No acute findings.  Emphysema in the lung apices. Other: Atherosclerotic calcifications of the carotid bulbs. IMPRESSION: 1. No acute intracranial abnormality. Right parietal scalp laceration. 2. No acute facial bone fracture. 3. No acute fracture or traumatic listhesis of the cervical spine. Emphysema  (ICD10-J43.9). Electronically Signed   By: Orvan Falconer M.D.   On: 03/25/2023 09:17   CT Cervical Spine Wo Contrast  Result Date: 03/25/2023 CLINICAL DATA:  Head trauma, minor (Age >= 65y); Neck trauma (Age >= 65y); Facial trauma, blunt EXAM: CT HEAD WITHOUT CONTRAST CT MAXILLOFACIAL WITHOUT CONTRAST CT CERVICAL SPINE WITHOUT CONTRAST TECHNIQUE: Multidetector CT imaging of the head, cervical spine, and maxillofacial structures were performed using the standard protocol without intravenous contrast. Multiplanar CT image reconstructions of the cervical spine and maxillofacial structures were also generated. RADIATION DOSE REDUCTION: This exam was performed according to the departmental dose-optimization program which includes automated exposure control, adjustment of the mA and/or kV according to patient size and/or use of iterative reconstruction technique. COMPARISON:  CTA head/neck 02/18/2023. MRI cervical spine 02/16/2023. FINDINGS: CT HEAD FINDINGS Brain: No acute hemorrhage. Stable postoperative changes of prior right parieto-occipital mass resection. No new loss of gray-white differentiation. No hydrocephalus or extra-axial collection. No mass effect or midline shift. Vascular: No hyperdense vessel or unexpected calcification. Skull: Prior right parieto-occipital craniotomy. No acute calvarial fracture. Skull base is unremarkable. Other: Right parietal scalp laceration. CT MAXILLOFACIAL FINDINGS Osseous: No fracture or  mandibular dislocation. No destructive process. Orbits: Negative. No traumatic or inflammatory finding. Sinuses: Clear. Soft tissues: Negative. CT CERVICAL SPINE FINDINGS Alignment: Normal. Skull base and vertebrae: No acute fracture. Normal craniocervical junction. No suspicious bone lesions. Soft tissues and spinal canal: No prevertebral fluid or swelling. No visible canal hematoma. Disc levels: Multilevel cervical spondylosis, worst at C5-6 and C6-7, where there is at least mild spinal  canal stenosis. Upper chest: No acute findings.  Emphysema in the lung apices. Other: Atherosclerotic calcifications of the carotid bulbs. IMPRESSION: 1. No acute intracranial abnormality. Right parietal scalp laceration. 2. No acute facial bone fracture. 3. No acute fracture or traumatic listhesis of the cervical spine. Emphysema (ICD10-J43.9). Electronically Signed   By: Orvan Falconer M.D.   On: 03/25/2023 09:17   CT Maxillofacial Wo Contrast  Result Date: 03/25/2023 CLINICAL DATA:  Head trauma, minor (Age >= 65y); Neck trauma (Age >= 65y); Facial trauma, blunt EXAM: CT HEAD WITHOUT CONTRAST CT MAXILLOFACIAL WITHOUT CONTRAST CT CERVICAL SPINE WITHOUT CONTRAST TECHNIQUE: Multidetector CT imaging of the head, cervical spine, and maxillofacial structures were performed using the standard protocol without intravenous contrast. Multiplanar CT image reconstructions of the cervical spine and maxillofacial structures were also generated. RADIATION DOSE REDUCTION: This exam was performed according to the departmental dose-optimization program which includes automated exposure control, adjustment of the mA and/or kV according to patient size and/or use of iterative reconstruction technique. COMPARISON:  CTA head/neck 02/18/2023. MRI cervical spine 02/16/2023. FINDINGS: CT HEAD FINDINGS Brain: No acute hemorrhage. Stable postoperative changes of prior right parieto-occipital mass resection. No new loss of gray-white differentiation. No hydrocephalus or extra-axial collection. No mass effect or midline shift. Vascular: No hyperdense vessel or unexpected calcification. Skull: Prior right parieto-occipital craniotomy. No acute calvarial fracture. Skull base is unremarkable. Other: Right parietal scalp laceration. CT MAXILLOFACIAL FINDINGS Osseous: No fracture or mandibular dislocation. No destructive process. Orbits: Negative. No traumatic or inflammatory finding. Sinuses: Clear. Soft tissues: Negative. CT CERVICAL SPINE  FINDINGS Alignment: Normal. Skull base and vertebrae: No acute fracture. Normal craniocervical junction. No suspicious bone lesions. Soft tissues and spinal canal: No prevertebral fluid or swelling. No visible canal hematoma. Disc levels: Multilevel cervical spondylosis, worst at C5-6 and C6-7, where there is at least mild spinal canal stenosis. Upper chest: No acute findings.  Emphysema in the lung apices. Other: Atherosclerotic calcifications of the carotid bulbs. IMPRESSION: 1. No acute intracranial abnormality. Right parietal scalp laceration. 2. No acute facial bone fracture. 3. No acute fracture or traumatic listhesis of the cervical spine. Emphysema (ICD10-J43.9). Electronically Signed   By: Orvan Falconer M.D.   On: 03/25/2023 09:17   DG Chest Port 1 View  Result Date: 03/25/2023 CLINICAL DATA:  wheezing EXAM: PORTABLE CHEST 1 VIEW COMPARISON:  CXR 02/17/23 FINDINGS: No pleural effusion. No pneumothorax. Likely chronic right lateral fourth, sixth, and seventh rib fractures. No focal airspace opacity. Normal cardiac and mediastinal contours. Visualized upper abdomen is unremarkable. IMPRESSION: No focal airspace opacity Electronically Signed   By: Lorenza Cambridge M.D.   On: 03/25/2023 08:54    Procedures Procedures    Medications Ordered in ED Medications - No data to display  ED Course/ Medical Decision Making/ A&P Clinical Course as of 03/25/23 1725  Wed Mar 25, 2023  0907 Chest x-Arianna Haydon reviewed interpreted without evidence of focal infiltrate radiologist interpretation concurs [DR]  1003 CT head, cervical spine, and maxillofacial reviewed and interpreted with no evidence of acute abnormality radiologist interpretation concurs [DR]  1004 INR reviewed interpreted and  therapeutic at 2.2 Dilantin level reviewed interpreted and therapeutic at 14.8 Mild leukopenia noted with white count 2900 which is decreased from first prior that was normal [DR]  1448 I had reviewed and no evidence of acute  intracranial abnormality noted per radiologist with stable noncontrast MRI appearance of the brain with large region of chronic encephalomalacia within the right parietal and occipital lobes [DR]  1722 MRI of the lumbar spine obtained and shows spondylosis, multilevel subarticular stenosis and foraminal stenosis greatest at the right L4-L5 with multiple chronic vertebral compression fractures.  There is no evidence of spinal cord compression that would explain the patient's intermittent inability to walk.   [DR]    Clinical Course User Index [DR] Margarita Grizzle, MD                                 Medical Decision Making Amount and/or Complexity of Data Reviewed Labs: ordered. Radiology: ordered.   67 year old female with known seizure disorder who has had 2 seizures in the last 24 hours and now is complaining of numbness in legs and has reported to have decreased ability to walk On her physical exam she appears to have some decreased bilateral leg hip flexion No change in sharp or light sensation noted on exam No perineal decrease Head CT used to evaluate due to fall on Coumadin and changes in strength  Head CT without evidence of acute abnormality Cervical spine and maxillofacial x-rays also obtained due to fall with no evidence of acute abnormality  1- seizure with known sz d/o- dilantin therapeutic, no sx here 2- leg weakness-difficulty walking- patient having home PT but seems to have intermittent episodes of leg weakness. Patient was able walk this am with walker.  States one minute I can walk and the next I can't. DDX includes but not limited to: New stroke Back lesion Functional etiology Vascular lesion- pulses normal,  MRI brain negative for acute MRI lumbar pending- may d/c if negative MRI reviewed without distinct etiology for patient's difficulty walking.  She reports that she does have PT coming in again on Friday.  She has a walker at home.  She appears stable for  discharge to home.        Final Clinical Impression(s) / ED Diagnoses Final diagnoses:  Seizure (HCC)  Difficulty walking    Rx / DC Orders ED Discharge Orders     None         Margarita Grizzle, MD 03/25/23 1725

## 2023-03-25 NOTE — Progress Notes (Signed)
   03/25/23 0755  Spiritual Encounters  Type of Visit Initial  Care provided to: Pt not available  Referral source Trauma page  Reason for visit Trauma  OnCall Visit No   Chaplain responded to a level two trauma. The patient, [name] was attended to by the medical team. The patient's family member provided information to the medical team to assist with [name]'s care.  If a chaplain is requested someone will respond.   Valerie Roys  [facility]  [phone]

## 2023-03-25 NOTE — Progress Notes (Signed)
Orthopedic Tech Progress Note Patient Details:  [name] 05-08-1956 [id number]  Level 2 trauma   Patient ID: [name], female   DOB: 1955/10/08, 67 y.o.   MRN: [id number]  [name] 03/25/2023, 8:26 AM

## 2023-03-25 NOTE — ED Notes (Signed)
Placed pt on bedpan, attempt to collect urine, unsuccessful, will re-attempt to collect.

## 2023-03-25 NOTE — ED Notes (Signed)
Ray, MD at bedside.  

## 2023-03-25 NOTE — Discharge Instructions (Signed)
Please continue taking your seizure medications Please call your neurologist for follow-up as soon as possible for reevaluation regarding any difficulty walking and seizures. You can return to the emergency department at any time if you are having worsening symptoms.

## 2023-03-25 NOTE — ED Notes (Signed)
Lab at bedside, difficult stick.

## 2023-03-25 NOTE — Telephone Encounter (Signed)
8/21 @ 8:11 am Received fax from call center to let someone know, patient's daughter call to cancel treatment appointment for today, due to she is taking her mother to the hospital.  Patient is currently in the [facility] Emergency Department.  Email sent to L2 machine/Support RTT and copied Dr. Sandrea Matte so they are aware.

## 2023-03-25 NOTE — ED Notes (Signed)
Ray, MD still at bedside assessing pt.

## 2023-03-25 NOTE — ED Triage Notes (Signed)
Pt states she had two seizures yesterday that were witnessed by her daughter. Pt takes coumadin. Pt's daughter states pt fell at 0730 this morning and hit her head on brick wall. Pt has large hematoma on palpation on right side of head.

## 2023-03-26 ENCOUNTER — Ambulatory Visit
Admission: RE | Admit: 2023-03-26 | Discharge: 2023-03-26 | Disposition: A | Discharge status: Home / Self Care | Payer: Medicare HMO | Source: Ambulatory Visit | Attending: Radiation Oncology | Admitting: Radiation Oncology

## 2023-03-26 ENCOUNTER — Other Ambulatory Visit: Payer: Self-pay | Admitting: Physician Assistant

## 2023-03-26 ENCOUNTER — Other Ambulatory Visit: Payer: Self-pay

## 2023-03-26 ENCOUNTER — Inpatient Hospital Stay: Payer: Medicare HMO | Attending: Hematology and Oncology | Admitting: Hematology and Oncology

## 2023-03-26 VITALS — BP 112/60 | HR 87 | Temp 97.5°F | Resp 18 | Ht 67.75 in | Wt 126.7 lb

## 2023-03-26 DIAGNOSIS — Z808 Family history of malignant neoplasm of other organs or systems: Secondary | ICD-10-CM | POA: Diagnosis not present

## 2023-03-26 DIAGNOSIS — Z170 Estrogen receptor positive status [ER+]: Secondary | ICD-10-CM | POA: Diagnosis not present

## 2023-03-26 DIAGNOSIS — C50412 Malignant neoplasm of upper-outer quadrant of left female breast: Secondary | ICD-10-CM | POA: Diagnosis not present

## 2023-03-26 DIAGNOSIS — Z87891 Personal history of nicotine dependence: Secondary | ICD-10-CM | POA: Insufficient documentation

## 2023-03-26 DIAGNOSIS — Z800 Family history of malignant neoplasm of digestive organs: Secondary | ICD-10-CM | POA: Insufficient documentation

## 2023-03-26 DIAGNOSIS — Z8616 Personal history of COVID-19: Secondary | ICD-10-CM | POA: Insufficient documentation

## 2023-03-26 DIAGNOSIS — Z510 Encounter for antineoplastic radiation therapy: Secondary | ICD-10-CM | POA: Diagnosis not present

## 2023-03-26 LAB — RAD ONC ARIA SESSION SUMMARY
Course Elapsed Days: 8
Plan Fractions Treated to Date: 6
Plan Prescribed Dose Per Fraction: 2.67 Gy
Plan Total Fractions Prescribed: 15
Plan Total Prescribed Dose: 40.05 Gy
Reference Point Dosage Given to Date: 16.02 Gy
Reference Point Session Dosage Given: 2.67 Gy
Session Number: 6

## 2023-03-26 NOTE — Assessment & Plan Note (Addendum)
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 Given small tumor size and ER positivity, she had lumpectomy for this followed by Oncotype testing.  No role for adjuvant chemotherapy hence she was supposed to move forward with radiation however had a hospitalization and rehabilitation because of her seizure disorder.  She just darted radiation and will now completed proximately September 13. We have reviewed role of antiestrogen therapy once again.  After completing adjuvant radiation, she will start aromatase inhibitors, discussed mechanical fraction, adverse effects with aromatase elevators including but not limited to postmenopausal symptoms such as hot flashes, vaginal dryness, arthralgias, bone density loss.  Baseline bone density scheduled for December.  She will return to clinic for follow-up via telephone end of September to initiate antiestrogen therapy.  Soon after will arrange for toxicity check/survivorship visit late December or early January.

## 2023-03-26 NOTE — Progress Notes (Signed)
[facility] CONSULT NOTE  Patient Care Team: [name], FNP as PCP - General (Family Medicine) Rennis Golden Lisette Abu, MD as PCP - Cardiology (Cardiology) Van Clines, MD as Consulting Physician (Neurology) [name], MD as Consulting Physician (Hematology and Oncology) Manus Rudd, MD as Consulting Physician (General Surgery) Antony Blackbird, MD as Consulting Physician (Radiation Oncology) Pershing Proud, RN as Oncology Nurse Navigator Donnelly Angelica, RN as Oncology Nurse Navigator  CHIEF COMPLAINTS/PURPOSE OF CONSULTATION:  Newly diagnosed breast cancer  HISTORY OF PRESENTING ILLNESS:  [name] 67 y.o. female is here because of recent diagnosis of left breast cancer  I reviewed her records extensively and collaborated the history with the patient.  SUMMARY OF ONCOLOGIC HISTORY: Oncology History  Malignant neoplasm of upper-outer quadrant of left breast in female, estrogen receptor positive (HCC)  12/15/2022 Mammogram   IMPRESSION: 1. Suspicious 1.1 cm OUTER LEFT breast mass. Tissue sampling is recommended. No abnormal appearing LEFT axillary lymph nodes. 2. No other significant mammographic findings within either breast.   12/22/2022 Pathology Results   Pathology showed grade 2 IDC, ER 95% moderate-strong staining, PR neg. Her 2 neg.   12/30/2022 Initial Diagnosis   Malignant neoplasm of upper-outer quadrant of left breast in female, estrogen receptor positive (HCC)   12/31/2022 Cancer Staging   Staging form: Breast, AJCC 8th Edition - Clinical stage from 12/31/2022: Stage IA (cT1c, cN0, cM0, G2, ER+, PR-, HER2-) - Signed by [name], MD on 12/31/2022 Stage prefix: Initial diagnosis Histologic grading system: 3 grade system Laterality: Left Staged by: Pathologist and managing physician Stage used in treatment planning: Yes National guidelines used in treatment planning: Yes Type of national guideline used in treatment planning: NCCN    01/08/2023 Genetic Testing   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  The report date is January 08, 2023.  The Multi-Cancer + RNA Panel offered by [facility] includes sequencing and/or deletion/duplication analysis of the following 70 genes:  AIP*, ALK, APC*, ATM*, AXIN2*, BAP1*, BARD1*, BLM*, BMPR1A*, BRCA1*, BRCA2*, BRIP1*, CDC73*, CDH1*, CDK4, CDKN1B*, CDKN2A, CHEK2*, CTNNA1*, DICER1*, EPCAM (del/dup only), EGFR, FH*, FLCN*, GREM1 (promoter dup only), HOXB13, KIT, LZTR1, MAX*, MBD4, MEN1*, MET, MITF, MLH1*, MSH2*, MSH3*, MSH6*, MUTYH*, NF1*, NF2*, NTHL1*, PALB2*, PDGFRA, PMS2*, POLD1*, POLE*, POT1*, PRKAR1A*, PTCH1*, PTEN*, RAD51C*, RAD51D*, RB1*, RET, SDHA* (sequencing only), SDHAF2*, SDHB*, SDHC*, SDHD*, SMAD4*, SMARCA4*, SMARCB1*, SMARCE1*, STK11*, SUFU*, TMEM127*, TP53*, TSC1*, TSC2*, VHL*. RNA analysis is performed for * genes.      Last mammogram 10 yrs ago No history of breast  Smoker 3 PPD for 25 yrs. Quit 4/5 yrs ago Patient arrived to the appointment with her daughter [name]. She at baseline has history of seizure disorder.   Since her last visit she had left breast lumpectomy which showed grade 2 invasive ductal carcinoma measuring 1.6 cm, DCIS present, focal without necrosis, negative margins, prognostics showed ER 95% positive moderate to strong staining PR 0% negative HER2 2+ by IHC and negative by FISH  Oncotype DX showed a score of 19, no role for adjuvant chemo.  She then was hospitalized for almost 3 weeks according to the daughter because of seizure disorder and subsequent rehabilitation.  She recently started radiation and now is anticipated to finish it in mid September.  Rest of the pertinent 10 point ROS reviewed and negative  MEDICAL HISTORY:  Past Medical History:  Diagnosis Date   Acute bronchitis 08/06/2022   Acute respiratory failure  with hypoxia (HCC) 03/31/2015   Altered mental status    Asthma     Ataxia 08/26/2019   Atypical chest pain 02/17/2017   Cerebral hemorrhage (HCC) 1989   Chronic back pain    Closed fracture of distal end of left radius 07/17/2022   Closed fracture of fifth metatarsal bone 01/04/2019   Closed fracture of proximal phalanx of great toe 01/04/2019   Closed nondisplaced fracture of styloid process of left radius 08/06/2022   COPD (chronic obstructive pulmonary disease) (HCC)    no meds per daughter   COPD with exacerbation (HCC) 03/30/2015   Costochondritis 02/17/2017   COVID-19 virus infection 04/02/2020   DDD (degenerative disc disease) 06/12/2013   DVT (deep venous thrombosis) (HCC)    "she's had several since 1990"   GERD (gastroesophageal reflux disease)    no meds per daughter   Headache    "usually around time when she's had a seizure"   History of blood transfusion    "when she was a baby"   History of kidney stones    History of stomach ulcers    Hypoxia 01/18/2015   Lactic acidosis 04/02/2020   Neuropathy    Seizures (HCC)    "because of her brain surgery"  last one 07/28/22   Sepsis secondary to UTI (HCC) 01/17/2015   Stroke [facility]) 1990's   family denies residual on 11/09/2014   Tunnel vision    "since brain OR"    SURGICAL HISTORY: Past Surgical History:  Procedure Laterality Date   BRAIN SURGERY  1989   craniotomy with hematoma evacuation   BREAST BIOPSY Left 12/22/2022   [place] LT BREAST BX W LOC DEV 1ST LESION IMG BX SPEC US GUIDE 12/22/2022 [facility]   BREAST BIOPSY  01/09/2023   MM LT RADIOACTIVE SEED LOC MAMMO GUIDE 01/09/2023 [facility]AMMO[facility]   BREAST LUMPECTOMY WITH RADIOACTIVE SEED AND SENTINEL LYMPH NODE BIOPSY Left 01/12/2023   Procedure: LEFT BREAST LUMPECTOMY WITH RADIOACTIVE SEED AND SENTINEL LYMPH NODE BIOPSY;  Surgeon: Manus Rudd, MD;  Location: [facility];  Service: General;  Laterality: Left;   CESAREAN SECTION  1979; 1987; 1989   HEMORRHOID SURGERY     IVC FILTER INSERTION N/A 10/12/2020    Procedure: IVC FILTER INSERTION;  Surgeon: Chuck Hint, MD;  Location: [facility];  Service: Cardiovascular;  Laterality: N/A;   OPEN REDUCTION INTERNAL FIXATION (ORIF) DISTAL RADIAL FRACTURE Left 08/25/2022   Procedure: OPEN REDUCTION INTERNAL FIXATION (ORIF) DISTAL RADIUS FRACTURE of nascent malunion;  Surgeon: Bradly Bienenstock, MD;  Location: [facility] OR;  Service: Orthopedics;  Laterality: Left;  regional with iv sedation   TUBAL LIGATION  1990's    SOCIAL HISTORY: Social History   Socioeconomic History   Marital status: Divorced    Spouse name: Not on file   Number of children: 3   Years of education: Not on file   Highest education level: Not on file  Occupational History   Not on file  Tobacco Use   Smoking status: Former    Current packs/day: 0.00    Average packs/day: 0.5 packs/day for 20.0 years (10.0 ttl pk-yrs)    Types: Cigarettes    Start date: 10/26/1997    Quit date: 10/26/2017    Years since quitting: 5.4   Smokeless tobacco: Never   Tobacco comments:    Previously smoked 2 ppd, down to 4 cigs/day, patient quit smoking in 2019  Vaping Use   Vaping status: Never Used  Substance and  Sexual Activity   Alcohol use: No   Drug use: No   Sexual activity: Never    Birth control/protection: Post-menopausal  Other Topics Concern   Not on file  Social History Narrative   Pt lives in 2 story home with her daughter, daughter's family and pt's mother-in-law   Has 2 living children / 1 deceased   April 11, 2023 grade education   Worked for [employer] until 1998 when she became disabled.    Right handed   Social Determinants of Health   Financial Resource Strain: Low Risk  (12/31/2022)   Overall Financial Resource Strain (CARDIA)    Difficulty of Paying Living Expenses: Not very hard  Food Insecurity: No Food Insecurity (03/11/2023)   Hunger Vital Sign    Worried About Running Out of Food in the Last Year: Never true    Ran Out of Food in the Last Year: Never true   Transportation Needs: No Transportation Needs (03/11/2023)   PRAPARE - Administrator, Civil Service (Medical): No    Lack of Transportation (Non-Medical): No  Physical Activity: Not on file  Stress: Not on file  Social Connections: Not on file  Intimate Partner Violence: Not At Risk (03/11/2023)   Humiliation, Afraid, Rape, and Kick questionnaire    Fear of Current or Ex-Partner: No    Emotionally Abused: No    Physically Abused: No    Sexually Abused: No    FAMILY HISTORY: Family History  Problem Relation Age of Onset   Colon cancer Mother        started as vaginal cancer   Heart attack Mother        4 MIs, started in her 22s, also vaginal cancer and colon cancer   Cancer Mother    Heart attack Father        Died suddenly age 46 - autopsy revealed heart attack her patient   Cancer Brother        Sinus cancer   Cervical cancer Maternal Aunt    Rectal cancer Neg Hx    Stomach cancer Neg Hx    Esophageal cancer Neg Hx     ALLERGIES:  is allergic to zonegran [zonisamide], chocolate, codeine, keppra [levetiracetam], lyrica [pregabalin], and olive oil.  MEDICATIONS:  Current Outpatient Medications  Medication Sig Dispense Refill   acetaminophen (TYLENOL) 325 MG tablet Take 1-2 tablets (325-650 mg total) by mouth every 4 (four) hours as needed for mild pain.     cyanocobalamin (VITAMIN B12) 1000 MCG tablet Take 2 tablets (2,000 mcg total) by mouth daily. 60 tablet 0   diclofenac Sodium (VOLTAREN) 1 % GEL Apply 2 g topically 4 (four) times daily as needed. To top of right foot 100 g 0   DILANTIN 100 MG ER capsule Take 1 capsule (100 mg total) by mouth 2 (two) times daily. 180 capsule 3   DILANTIN 30 MG ER capsule TAKE 1 CAPSULE BY MOUTH EVERY  NIGHT ALONG WITH 100 MG CAPSULE  FOR A TOTAL EVERY NIGHT DOSE OF  130 MG (Patient taking differently: Take 30 mg by mouth daily. TAKE 1 CAPSULE BY MOUTH EVERY morning ALONG WITH 100 MG CAPSULE  FOR A TOTAL morning DOSE OF 130 MG)  100 capsule 3   famotidine (PEPCID) 20 MG tablet 1 tablet Orally twice a day for 90 days     folic acid (FOLVITE) 1 MG tablet Take 1 tablet (1 mg total) by mouth daily. 30 tablet 0   gabapentin (NEURONTIN) 100 MG capsule  Take 1 capsule (100 mg total) by mouth 3 (three) times daily. 90 capsule 0   LORazepam (ATIVAN) 1 MG tablet TAKE 1 TABLET BY MOUTH AS DIRECTED AS NEEDED FOR 3 OR MORE SEIZURES IN 24 HOURS. DO NOT TAKE MORE THAN 2 TABLETS IN 24 HOURS. Strength: 1 mg 10 tablet 5   oxybutynin (DITROPAN-XL) 5 MG 24 hr tablet Take 1 tablet (5 mg total) by mouth at bedtime. 30 tablet 0   potassium chloride SA (KLOR-CON M) 20 MEQ tablet Take 1 tablet (20 mEq total) by mouth every other day. 15 tablet 0   topiramate (TOPAMAX) 50 MG tablet Take 1 tablet (50 mg total) by mouth every morning AND 2 tablets (100 mg total) at bedtime. 90 tablet 0   VIMPAT 200 MG TABS tablet Take 1 tablet (200 mg total) by mouth 2 (two) times daily.     warfarin (COUMADIN) 6 MG tablet Take 1 tablet (6 mg total) by mouth daily at 4 PM. 30 tablet 0   No current facility-administered medications for this visit.     PHYSICAL EXAMINATION: ECOG PERFORMANCE STATUS: 0 - Asymptomatic  Vitals:   03/26/23 1158  BP: 112/60  Pulse: 87  Resp: 18  Temp: (!) 97.5 F (36.4 C)  SpO2: 94%    Filed Weights   03/26/23 1158  Weight: 126 lb 11.2 oz (57.5 kg)   GENERAL: Petite lady in no acute distress, chronic tremor present, arrived in a wheelchair today  LABORATORY DATA:  I have reviewed the data as listed Lab Results  Component Value Date   WBC 2.9 (L) 03/25/2023   HGB 13.1 03/25/2023   HCT 41.6 03/25/2023   MCV 104.5 (H) 03/25/2023   PLT 109 (L) 03/25/2023   Lab Results  Component Value Date   NA 140 03/25/2023   K 3.8 03/25/2023   CL 107 03/25/2023   CO2 24 03/25/2023    RADIOGRAPHIC STUDIES: I have personally reviewed the radiological reports and agreed with the findings in the report.  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 Given small tumor size and ER positivity, she had lumpectomy for this followed by Oncotype testing.  No role for adjuvant chemotherapy hence she was supposed to move forward with radiation however had a hospitalization and rehabilitation because of her seizure disorder.  She just darted radiation and will now completed proximately September 13. We have reviewed role of antiestrogen therapy once again.  After completing adjuvant radiation, she will start aromatase inhibitors, discussed mechanical fraction, adverse effects with aromatase elevators including but not limited to postmenopausal symptoms such as hot flashes, vaginal dryness, arthralgias, bone density loss.  Baseline bone density scheduled for December.  She will return to clinic for follow-up via telephone end of September to initiate antiestrogen therapy.  Soon after will arrange for toxicity check/survivorship visit late December or early January.  Total time spent: 30 minutes including history, physical exam, review of records, counseling and coordination of care All questions were answered. The patient knows to call the clinic with any problems, questions or concerns.    [name], MD 03/26/23

## 2023-03-27 ENCOUNTER — Other Ambulatory Visit: Payer: Self-pay

## 2023-03-27 ENCOUNTER — Ambulatory Visit: Admission: RE | Admit: 2023-03-27 | Payer: Medicare HMO | Source: Ambulatory Visit

## 2023-03-27 DIAGNOSIS — Z510 Encounter for antineoplastic radiation therapy: Secondary | ICD-10-CM | POA: Diagnosis not present

## 2023-03-27 DIAGNOSIS — Z170 Estrogen receptor positive status [ER+]: Secondary | ICD-10-CM | POA: Diagnosis not present

## 2023-03-27 DIAGNOSIS — C50412 Malignant neoplasm of upper-outer quadrant of left female breast: Secondary | ICD-10-CM | POA: Diagnosis not present

## 2023-03-27 LAB — RAD ONC ARIA SESSION SUMMARY
Course Elapsed Days: 9
Plan Fractions Treated to Date: 7
Plan Prescribed Dose Per Fraction: 2.67 Gy
Plan Total Fractions Prescribed: 15
Plan Total Prescribed Dose: 40.05 Gy
Reference Point Dosage Given to Date: 18.69 Gy
Reference Point Session Dosage Given: 2.67 Gy
Session Number: 7

## 2023-03-30 ENCOUNTER — Ambulatory Visit: Admission: RE | Admit: 2023-03-30 | Payer: Medicare HMO | Source: Ambulatory Visit

## 2023-03-30 ENCOUNTER — Other Ambulatory Visit: Payer: Self-pay

## 2023-03-30 DIAGNOSIS — I82A11 Acute embolism and thrombosis of right axillary vein: Secondary | ICD-10-CM | POA: Diagnosis not present

## 2023-03-30 DIAGNOSIS — I480 Paroxysmal atrial fibrillation: Secondary | ICD-10-CM | POA: Diagnosis not present

## 2023-03-30 DIAGNOSIS — Z510 Encounter for antineoplastic radiation therapy: Secondary | ICD-10-CM | POA: Diagnosis not present

## 2023-03-30 DIAGNOSIS — G5793 Unspecified mononeuropathy of bilateral lower limbs: Secondary | ICD-10-CM | POA: Diagnosis not present

## 2023-03-30 DIAGNOSIS — Z7901 Long term (current) use of anticoagulants: Secondary | ICD-10-CM | POA: Diagnosis not present

## 2023-03-30 DIAGNOSIS — Z8616 Personal history of COVID-19: Secondary | ICD-10-CM | POA: Diagnosis not present

## 2023-03-30 DIAGNOSIS — K5900 Constipation, unspecified: Secondary | ICD-10-CM | POA: Diagnosis not present

## 2023-03-30 DIAGNOSIS — Z87891 Personal history of nicotine dependence: Secondary | ICD-10-CM | POA: Diagnosis not present

## 2023-03-30 DIAGNOSIS — Z9181 History of falling: Secondary | ICD-10-CM | POA: Diagnosis not present

## 2023-03-30 DIAGNOSIS — G9389 Other specified disorders of brain: Secondary | ICD-10-CM | POA: Diagnosis not present

## 2023-03-30 DIAGNOSIS — Z170 Estrogen receptor positive status [ER+]: Secondary | ICD-10-CM | POA: Diagnosis not present

## 2023-03-30 DIAGNOSIS — C50412 Malignant neoplasm of upper-outer quadrant of left female breast: Secondary | ICD-10-CM | POA: Diagnosis not present

## 2023-03-30 DIAGNOSIS — I10 Essential (primary) hypertension: Secondary | ICD-10-CM | POA: Diagnosis not present

## 2023-03-30 DIAGNOSIS — G40219 Localization-related (focal) (partial) symptomatic epilepsy and epileptic syndromes with complex partial seizures, intractable, without status epilepticus: Secondary | ICD-10-CM | POA: Diagnosis not present

## 2023-03-30 DIAGNOSIS — J4489 Other specified chronic obstructive pulmonary disease: Secondary | ICD-10-CM | POA: Diagnosis not present

## 2023-03-30 DIAGNOSIS — M48061 Spinal stenosis, lumbar region without neurogenic claudication: Secondary | ICD-10-CM | POA: Diagnosis not present

## 2023-03-30 DIAGNOSIS — D630 Anemia in neoplastic disease: Secondary | ICD-10-CM | POA: Diagnosis not present

## 2023-03-30 DIAGNOSIS — F419 Anxiety disorder, unspecified: Secondary | ICD-10-CM | POA: Diagnosis not present

## 2023-03-30 LAB — RAD ONC ARIA SESSION SUMMARY
Course Elapsed Days: 12
Plan Fractions Treated to Date: 8
Plan Prescribed Dose Per Fraction: 2.67 Gy
Plan Total Fractions Prescribed: 15
Plan Total Prescribed Dose: 40.05 Gy
Reference Point Dosage Given to Date: 21.36 Gy
Reference Point Session Dosage Given: 2.67 Gy
Session Number: 8

## 2023-03-31 ENCOUNTER — Ambulatory Visit
Admission: RE | Admit: 2023-03-31 | Discharge: 2023-03-31 | Disposition: A | Discharge status: Home / Self Care | Payer: Medicare HMO | Source: Ambulatory Visit | Attending: Radiation Oncology | Admitting: Radiation Oncology

## 2023-03-31 ENCOUNTER — Ambulatory Visit: Admission: RE | Admit: 2023-03-31 | Payer: Medicare HMO | Source: Ambulatory Visit

## 2023-03-31 ENCOUNTER — Other Ambulatory Visit: Payer: Self-pay

## 2023-03-31 DIAGNOSIS — C50412 Malignant neoplasm of upper-outer quadrant of left female breast: Secondary | ICD-10-CM | POA: Diagnosis not present

## 2023-03-31 DIAGNOSIS — Z170 Estrogen receptor positive status [ER+]: Secondary | ICD-10-CM | POA: Diagnosis not present

## 2023-03-31 DIAGNOSIS — Z510 Encounter for antineoplastic radiation therapy: Secondary | ICD-10-CM | POA: Diagnosis not present

## 2023-03-31 LAB — RAD ONC ARIA SESSION SUMMARY
Course Elapsed Days: 13
Plan Fractions Treated to Date: 9
Plan Prescribed Dose Per Fraction: 2.67 Gy
Plan Total Fractions Prescribed: 15
Plan Total Prescribed Dose: 40.05 Gy
Reference Point Dosage Given to Date: 24.03 Gy
Reference Point Session Dosage Given: 2.67 Gy
Session Number: 9

## 2023-04-01 ENCOUNTER — Ambulatory Visit
Admission: RE | Admit: 2023-04-01 | Discharge: 2023-04-01 | Disposition: A | Discharge status: Home / Self Care | Payer: Medicare HMO | Source: Ambulatory Visit | Attending: Radiation Oncology | Admitting: Radiation Oncology

## 2023-04-01 ENCOUNTER — Other Ambulatory Visit: Payer: Self-pay

## 2023-04-01 DIAGNOSIS — Z510 Encounter for antineoplastic radiation therapy: Secondary | ICD-10-CM | POA: Diagnosis not present

## 2023-04-01 DIAGNOSIS — Z170 Estrogen receptor positive status [ER+]: Secondary | ICD-10-CM | POA: Diagnosis not present

## 2023-04-01 DIAGNOSIS — C50412 Malignant neoplasm of upper-outer quadrant of left female breast: Secondary | ICD-10-CM | POA: Diagnosis not present

## 2023-04-01 LAB — RAD ONC ARIA SESSION SUMMARY
Course Elapsed Days: 14
Plan Fractions Treated to Date: 10
Plan Prescribed Dose Per Fraction: 2.67 Gy
Plan Total Fractions Prescribed: 15
Plan Total Prescribed Dose: 40.05 Gy
Reference Point Dosage Given to Date: 26.7 Gy
Reference Point Session Dosage Given: 2.67 Gy
Session Number: 10

## 2023-04-02 ENCOUNTER — Other Ambulatory Visit: Payer: Self-pay

## 2023-04-02 ENCOUNTER — Ambulatory Visit
Admission: RE | Admit: 2023-04-02 | Discharge: 2023-04-02 | Disposition: A | Discharge status: Home / Self Care | Payer: Medicare HMO | Source: Ambulatory Visit | Attending: Radiation Oncology | Admitting: Radiation Oncology

## 2023-04-02 DIAGNOSIS — Z170 Estrogen receptor positive status [ER+]: Secondary | ICD-10-CM | POA: Diagnosis not present

## 2023-04-02 DIAGNOSIS — C50412 Malignant neoplasm of upper-outer quadrant of left female breast: Secondary | ICD-10-CM | POA: Diagnosis not present

## 2023-04-02 DIAGNOSIS — Z510 Encounter for antineoplastic radiation therapy: Secondary | ICD-10-CM | POA: Diagnosis not present

## 2023-04-02 LAB — RAD ONC ARIA SESSION SUMMARY
Course Elapsed Days: 15
Plan Fractions Treated to Date: 11
Plan Prescribed Dose Per Fraction: 2.67 Gy
Plan Total Fractions Prescribed: 15
Plan Total Prescribed Dose: 40.05 Gy
Reference Point Dosage Given to Date: 29.37 Gy
Reference Point Session Dosage Given: 2.67 Gy
Session Number: 11

## 2023-04-02 NOTE — Progress Notes (Signed)
Cardiology Clinic Note   Date: 04/07/2023 ID: [name], DOB Dec 28, 1955, MRN [id number]  Primary Cardiologist:  Chrystie Nose, MD  Patient Profile    [name] is a 67 y.o. female who presents to the clinic today for hospital follow up.     Past medical history significant for: PAF/a-flutter. Echo 02/16/2023: EF 60 to 65%.  No RWMA.  Grade 1 DD.  Normal RV function.  Mild LAE.  Trivial MR.  Aortic valve sclerosis/calcification without stenosis. 14-day ZIO 03/25/2023: Min HR 41 bpm, max HR 197 bpm, average HR 74 bpm.  Predominant underlying rhythm was sinus rhythm.  A-fib occurred <1% with HR range 112 to 164 bpm, longest lasting 4 minutes with average rate 137 bpm.  5 brief runs of PSVT lasting no more than 7 beats. DVT left and right upper extremity/left lower extremity. IVC filter placement 10/12/2020. Vascular ultrasound 11/27/2022: Patent IVC filter without evidence of occlusive thrombus. Vascular ultrasound lower extremity 02/15/2023: No evidence of DVT right lower extremity.  Chronic DVT left femoral vein. Vascular ultrasound upper extremity 02/17/2023: Acute DVT right axillary vein.  No evidence of thrombosis in the left subclavian. Hypertension. COPD. GERD. Seizure disorder. Stroke. AVM/intracranial hemorrhage. S/p craniotomy with hematoma evacuation 1989. Frequent falls. Breast cancer.     History of Present Illness    [name] evaluated by Dr. Rennis Golden on 02/18/2017 during hospital admission for chest pain.  She presented to the ED with complaints of shortness of breath and pleuritic chest pain worse on the right side.  It was thought she was having a COPD exacerbation.  She has an extensive family history of MI in both parents and grandparents with her father dying of MI.  She also has a long smoking history.  Her EKG was nonischemic.  She was instructed to follow-up as an outpatient.    She was not evaluated again 08/02/2020 for A-fib/flutter with RVR during  hospital admission for syncope and fall.  Patient was started on a Cardizem drip and treated for hypokalemia.  Troponin 25>> 24.  Per patient report she was coming down the stairs in her home and fell sustaining a C2 fracture.  It was felt she was not a good candidate for anticoagulation secondary to frequent seizures and history of right occipital AVM/intracranial hemorrhage.  Patient was also found to have a left upper extremity DVT during hospitalization.  She was discharged home and sinus rhythm with no amiodarone, beta-blocker, calcium channel blocker, or anticoagulation.    On outpatient follow-up with NG tube, PA-C 09/26/2020 patient reported falling in the shower 2 days after discharge from hospital.  She was complaining of left leg swelling, pain, and bruising.  She was found to have a left leg DVT and was evaluated by Dr. Cristal Deer for urgent DOD visit on 10/10/2020.  Patient agreed to hospital admission for monitored anticoagulation with assistance from neurology/neurosurgery.  Neurosurgery felt she could be initiated on heparin without bolus to see if she is tolerant.  Vascular surgery consulted to evaluate for IVC filter placement which she underwent on 10/12/2020.  Patient was last seen in the office by Dr. Rennis Golden on 12/13/2020 for follow-up.  She was in sinus rhythm at that time and otherwise stable.  She has not been seen since.  Patient was evaluated by Dr. Edilia Bo, vascular surgery on 11/27/2022 for follow-up of IVC filter.  Vascular ultrasound showed patent IVC filter without evidence of occlusive thrombus.  Patient underwent hospital admission from 02/09/2023 to 02/19/2023  for seizures.  She had 3 seizures prior to arrival at home, 1 in the ambulance, and 1 upon arriving to ED.  She had been out of Vimpat for several days.  Dilantin level was low.  Head CT was nonacute.  She developed a fever during hospital admission.  Respiratory viral panel is negative, strep throat negative.  Vascular  ultrasound of lower extremities showed chronic DVT left femoral vein.  She was found to have acute right upper extremity axillary DVT.  Her PICC line was removed and she was started on a 6 to 8-week course of oral anticoagulation.  She was also found to be in a-flutter.  She was started on oral Cardizem and instructed to follow-up with cardiology as an outpatient.  She was discharged on 02/19/2023 to medical specialty PCU.  Per Pharm.D. patient could not be started on Eliquis secondary to interaction with phenytoin.  She was instead started on Coumadin.  She was discharged from inpatient rehab on 02/28/2023.  CIR team contacted cardiology to review discharge medications.  Dr. Mayford Knife reviewed chart and reported "Due to soft BP would go ahead and stop the short acting cardizem. We will arrange an outpatient 2 week ziopatch to assess for recurrent arrhythmias. She is going to be on anticoagulation for DVT so will be protected if she has recurrent aflutter."  2-week ZIO showed low A-fib burden <1% and 5 brief runs of PSVT.  Patient presented to the ED on 03/25/2023 for witnessed seizures x 2.  She fell hitting her head on a brick wall and presented with a large hematoma on right side of head.  Head CT with no evidence of acute intracranial abnormality.    Today, patient is accompanied by her daughter. She reports occasional palpitations if she gets upset. She has not had any further falls or seizures since ED visit. Patient is currently undergoing radiation for breast cancer. She denies chest pain, shortness of breath, DOE, lower extremity edema. She and daughter are concerned about continuing coumadin secondary to patient not having any warning prior to seizure. Prior to her hospitalization in July her seizures were well controlled, however she still had an occasional one maybe once a month. The multiple seizures she had in July were secondary to running out of medication which occurred secondary to a change in  pharmacies and a delay getting the Vimpat renewed. They are now aware that they can get Vimpat through the [facility] if she were to ever run out again. No blood in stool or urine. Her INRs are being checked by PCP. She needs her coumadin prescription refilled which we will do today. If she continues on coumadin her PCP is willing to refill it going forward.      ROS: All other systems reviewed and are otherwise negative except as noted in History of Present Illness.  Studies Reviewed    EKG not ordered today.   Risk Assessment/Calculations     CHA2DS2-VASc Score = 5   This indicates a 7.2% annual risk of stroke. The patient's score is based upon: CHF History: 0 HTN History: 0 Diabetes History: 0 Stroke History: 2 Vascular Disease History: 1 Age Score: 1 Gender Score: 1             Physical Exam    VS:  BP 124/82   Pulse 67   Ht 5' 7.7" (1.72 m)   Wt 127 lb (57.6 kg)   SpO2 100%   BMI 19.48 kg/m  , BMI Body  mass index is 19.48 kg/m.  GEN: Well nourished, well developed, in no acute distress. Neck: No JVD or carotid bruits. Cardiac:  RRR. No murmurs. No rubs or gallops.   Respiratory:  Respirations regular and unlabored. Clear to auscultation without rales, wheezing or rhonchi. GI: Soft, nontender, nondistended. Extremities: Radials/DP/PT 2+ and equal bilaterally. No clubbing or cyanosis. No edema.  Skin: Warm and dry, no rash. Neuro: Strength intact.  Assessment & Plan    PAF/a-flutter.  14-day ZIO August 2024 showed <1% A-fib burden. Patient reports occasional palpitations if she gets upset but nothing sustained.  Denies spontaneous bleeding concerns.  Low-dose diltiazem discontinued secondary to soft BP.  Continue Coumadin.  Managed by PCP. There is concern for remaining on Coumadin secondary to seizure disorder and risk of falls, as she does not have a warning sign prior to seizure. Will reach out to Dr. Rennis Golden for recommendations for continuing  coumadin long term.  DVT.  S/p IVC filter placement March 2022.  Acute right upper extremity DVT found July 2024.  Recommendations for 6-8 weeks of anticoagulation. Eliquis not an option secondary to interaction with phenytoin. Continue Coumadin managed by PCP.  Continue to follow with vascular surgery. Hypertension: BP today 124/82.  Patient denies headaches, dizziness or vision changes. Not currently on antihypertensives.    Disposition: Return in 3 months or sooner as needed.          Signed, Etta Grandchild. Donicia Druck, DNP, NP-C

## 2023-04-03 ENCOUNTER — Ambulatory Visit: Payer: Medicare HMO | Admitting: Student

## 2023-04-03 ENCOUNTER — Ambulatory Visit: Admission: RE | Admit: 2023-04-03 | Payer: Medicare HMO | Source: Ambulatory Visit

## 2023-04-03 ENCOUNTER — Other Ambulatory Visit: Payer: Self-pay

## 2023-04-03 DIAGNOSIS — Z170 Estrogen receptor positive status [ER+]: Secondary | ICD-10-CM | POA: Diagnosis not present

## 2023-04-03 DIAGNOSIS — C50412 Malignant neoplasm of upper-outer quadrant of left female breast: Secondary | ICD-10-CM | POA: Diagnosis not present

## 2023-04-03 DIAGNOSIS — Z510 Encounter for antineoplastic radiation therapy: Secondary | ICD-10-CM | POA: Diagnosis not present

## 2023-04-03 LAB — RAD ONC ARIA SESSION SUMMARY
Course Elapsed Days: 16
Plan Fractions Treated to Date: 12
Plan Prescribed Dose Per Fraction: 2.67 Gy
Plan Total Fractions Prescribed: 15
Plan Total Prescribed Dose: 40.05 Gy
Reference Point Dosage Given to Date: 32.04 Gy
Reference Point Session Dosage Given: 2.67 Gy
Session Number: 12

## 2023-04-07 ENCOUNTER — Ambulatory Visit
Admission: RE | Admit: 2023-04-07 | Discharge: 2023-04-07 | Disposition: A | Discharge status: Home / Self Care | Payer: Medicare HMO | Source: Ambulatory Visit | Attending: Radiation Oncology | Admitting: Radiation Oncology

## 2023-04-07 ENCOUNTER — Encounter: Payer: Self-pay | Admitting: Student

## 2023-04-07 ENCOUNTER — Ambulatory Visit: Payer: Medicare HMO | Attending: Student | Admitting: Student

## 2023-04-07 ENCOUNTER — Ambulatory Visit: Payer: Medicare HMO

## 2023-04-07 ENCOUNTER — Ambulatory Visit: Payer: Medicare HMO | Admitting: Radiation Oncology

## 2023-04-07 ENCOUNTER — Other Ambulatory Visit: Payer: Self-pay

## 2023-04-07 VITALS — BP 124/82 | HR 67 | Ht 67.7 in | Wt 127.0 lb

## 2023-04-07 DIAGNOSIS — Z170 Estrogen receptor positive status [ER+]: Secondary | ICD-10-CM | POA: Diagnosis not present

## 2023-04-07 DIAGNOSIS — Z7901 Long term (current) use of anticoagulants: Secondary | ICD-10-CM | POA: Diagnosis not present

## 2023-04-07 DIAGNOSIS — G5793 Unspecified mononeuropathy of bilateral lower limbs: Secondary | ICD-10-CM | POA: Diagnosis not present

## 2023-04-07 DIAGNOSIS — I82621 Acute embolism and thrombosis of deep veins of right upper extremity: Secondary | ICD-10-CM

## 2023-04-07 DIAGNOSIS — K5900 Constipation, unspecified: Secondary | ICD-10-CM | POA: Diagnosis not present

## 2023-04-07 DIAGNOSIS — F419 Anxiety disorder, unspecified: Secondary | ICD-10-CM | POA: Diagnosis not present

## 2023-04-07 DIAGNOSIS — I480 Paroxysmal atrial fibrillation: Secondary | ICD-10-CM | POA: Diagnosis not present

## 2023-04-07 DIAGNOSIS — I82A11 Acute embolism and thrombosis of right axillary vein: Secondary | ICD-10-CM | POA: Diagnosis not present

## 2023-04-07 DIAGNOSIS — J4489 Other specified chronic obstructive pulmonary disease: Secondary | ICD-10-CM | POA: Diagnosis not present

## 2023-04-07 DIAGNOSIS — Z9181 History of falling: Secondary | ICD-10-CM | POA: Diagnosis not present

## 2023-04-07 DIAGNOSIS — Z87891 Personal history of nicotine dependence: Secondary | ICD-10-CM | POA: Diagnosis not present

## 2023-04-07 DIAGNOSIS — C50412 Malignant neoplasm of upper-outer quadrant of left female breast: Secondary | ICD-10-CM | POA: Diagnosis not present

## 2023-04-07 DIAGNOSIS — Z510 Encounter for antineoplastic radiation therapy: Secondary | ICD-10-CM | POA: Diagnosis not present

## 2023-04-07 DIAGNOSIS — Z8616 Personal history of COVID-19: Secondary | ICD-10-CM | POA: Diagnosis not present

## 2023-04-07 DIAGNOSIS — I10 Essential (primary) hypertension: Secondary | ICD-10-CM

## 2023-04-07 DIAGNOSIS — M48061 Spinal stenosis, lumbar region without neurogenic claudication: Secondary | ICD-10-CM | POA: Diagnosis not present

## 2023-04-07 DIAGNOSIS — G9389 Other specified disorders of brain: Secondary | ICD-10-CM | POA: Diagnosis not present

## 2023-04-07 DIAGNOSIS — G40219 Localization-related (focal) (partial) symptomatic epilepsy and epileptic syndromes with complex partial seizures, intractable, without status epilepticus: Secondary | ICD-10-CM | POA: Diagnosis not present

## 2023-04-07 DIAGNOSIS — D630 Anemia in neoplastic disease: Secondary | ICD-10-CM | POA: Diagnosis not present

## 2023-04-07 LAB — RAD ONC ARIA SESSION SUMMARY
Course Elapsed Days: 20
Plan Fractions Treated to Date: 13
Plan Prescribed Dose Per Fraction: 2.67 Gy
Plan Total Fractions Prescribed: 15
Plan Total Prescribed Dose: 40.05 Gy
Reference Point Dosage Given to Date: 34.71 Gy
Reference Point Session Dosage Given: 2.67 Gy
Session Number: 13

## 2023-04-07 MED ORDER — WARFARIN SODIUM 6 MG PO TABS: 6.0000 mg | ORAL_TABLET | Freq: Every day | ORAL | 1 refills | Status: DC

## 2023-04-07 NOTE — Patient Instructions (Signed)
Medication Instructions:  Refill for warfarin (COUMADIN) 6 MG tablet hs been sent to your pharmacy *If you need a refill on your cardiac medications before your next appointment, please call your pharmacy*   Lab Work: If you have labs (blood work) drawn today and your tests are completely normal, you will receive your results only by: MyChart Message (if you have MyChart) OR A paper copy in the mail If you have any lab test that is abnormal or we need to change your treatment, we will call you to review the results.   Follow-Up: At [facility], you and your health needs are our priority.  As part of our continuing mission to provide you with exceptional heart care, we have created designated Provider Care Teams.  These Care Teams include your primary Cardiologist (physician) and Advanced Practice Providers (APPs -  Physician Assistants and Nurse Practitioners) who all work together to provide you with the care you need, when you need it.  We recommend signing up for the patient portal called "MyChart".  Sign up information is provided on this After Visit Summary.  MyChart is used to connect with patients for Virtual Visits (Telemedicine).  Patients are able to view lab/test results, encounter notes, upcoming appointments, etc.  Non-urgent messages can be sent to your provider as well.   To learn more about what you can do with MyChart, go to [email or web address].    Your next appointment:   3 month(s)  Provider:   Chrystie Nose, MD

## 2023-04-08 ENCOUNTER — Other Ambulatory Visit: Payer: Self-pay

## 2023-04-08 ENCOUNTER — Ambulatory Visit
Admission: RE | Admit: 2023-04-08 | Discharge: 2023-04-08 | Disposition: A | Discharge status: Home / Self Care | Payer: Medicare HMO | Source: Ambulatory Visit | Attending: Radiation Oncology | Admitting: Radiation Oncology

## 2023-04-08 DIAGNOSIS — C50412 Malignant neoplasm of upper-outer quadrant of left female breast: Secondary | ICD-10-CM | POA: Diagnosis not present

## 2023-04-08 DIAGNOSIS — Z170 Estrogen receptor positive status [ER+]: Secondary | ICD-10-CM | POA: Diagnosis not present

## 2023-04-08 DIAGNOSIS — Z510 Encounter for antineoplastic radiation therapy: Secondary | ICD-10-CM | POA: Diagnosis not present

## 2023-04-08 LAB — RAD ONC ARIA SESSION SUMMARY
Course Elapsed Days: 21
Plan Fractions Treated to Date: 14
Plan Prescribed Dose Per Fraction: 2.67 Gy
Plan Total Fractions Prescribed: 15
Plan Total Prescribed Dose: 40.05 Gy
Reference Point Dosage Given to Date: 37.38 Gy
Reference Point Session Dosage Given: 2.67 Gy
Session Number: 14

## 2023-04-09 ENCOUNTER — Other Ambulatory Visit: Payer: Self-pay

## 2023-04-09 ENCOUNTER — Ambulatory Visit: Payer: Medicare HMO

## 2023-04-09 ENCOUNTER — Ambulatory Visit
Admission: RE | Admit: 2023-04-09 | Discharge: 2023-04-09 | Disposition: A | Discharge status: Home / Self Care | Payer: Medicare HMO | Source: Ambulatory Visit | Attending: Radiation Oncology | Admitting: Radiation Oncology

## 2023-04-09 DIAGNOSIS — Z510 Encounter for antineoplastic radiation therapy: Secondary | ICD-10-CM | POA: Diagnosis not present

## 2023-04-09 DIAGNOSIS — Z170 Estrogen receptor positive status [ER+]: Secondary | ICD-10-CM | POA: Diagnosis not present

## 2023-04-09 DIAGNOSIS — C50412 Malignant neoplasm of upper-outer quadrant of left female breast: Secondary | ICD-10-CM | POA: Diagnosis not present

## 2023-04-09 LAB — RAD ONC ARIA SESSION SUMMARY
Course Elapsed Days: 22
Plan Fractions Treated to Date: 15
Plan Prescribed Dose Per Fraction: 2.67 Gy
Plan Total Fractions Prescribed: 15
Plan Total Prescribed Dose: 40.05 Gy
Reference Point Dosage Given to Date: 40.05 Gy
Reference Point Session Dosage Given: 2.67 Gy
Session Number: 15

## 2023-04-10 ENCOUNTER — Ambulatory Visit
Admission: RE | Admit: 2023-04-10 | Discharge: 2023-04-10 | Disposition: A | Discharge status: Home / Self Care | Payer: Medicare HMO | Source: Ambulatory Visit | Attending: Radiation Oncology | Admitting: Radiation Oncology

## 2023-04-10 ENCOUNTER — Ambulatory Visit: Payer: Medicare HMO

## 2023-04-10 ENCOUNTER — Other Ambulatory Visit: Payer: Self-pay

## 2023-04-10 DIAGNOSIS — Z170 Estrogen receptor positive status [ER+]: Secondary | ICD-10-CM | POA: Diagnosis not present

## 2023-04-10 DIAGNOSIS — C50412 Malignant neoplasm of upper-outer quadrant of left female breast: Secondary | ICD-10-CM | POA: Diagnosis not present

## 2023-04-10 LAB — RAD ONC ARIA SESSION SUMMARY
Course Elapsed Days: 23
Plan Fractions Treated to Date: 1
Plan Prescribed Dose Per Fraction: 2 Gy
Plan Total Fractions Prescribed: 6
Plan Total Prescribed Dose: 12 Gy
Reference Point Dosage Given to Date: 2 Gy
Reference Point Session Dosage Given: 2 Gy
Session Number: 16

## 2023-04-13 ENCOUNTER — Other Ambulatory Visit: Payer: Self-pay

## 2023-04-13 ENCOUNTER — Ambulatory Visit
Admission: RE | Admit: 2023-04-13 | Discharge: 2023-04-13 | Disposition: A | Discharge status: Home / Self Care | Payer: Medicare HMO | Source: Ambulatory Visit | Attending: Radiation Oncology | Admitting: Radiation Oncology

## 2023-04-13 DIAGNOSIS — Z170 Estrogen receptor positive status [ER+]: Secondary | ICD-10-CM | POA: Diagnosis not present

## 2023-04-13 DIAGNOSIS — C50412 Malignant neoplasm of upper-outer quadrant of left female breast: Secondary | ICD-10-CM | POA: Diagnosis not present

## 2023-04-13 LAB — RAD ONC ARIA SESSION SUMMARY
Course Elapsed Days: 26
Plan Fractions Treated to Date: 2
Plan Prescribed Dose Per Fraction: 2 Gy
Plan Total Fractions Prescribed: 6
Plan Total Prescribed Dose: 12 Gy
Reference Point Dosage Given to Date: 4 Gy
Reference Point Session Dosage Given: 2 Gy
Session Number: 17

## 2023-04-14 ENCOUNTER — Ambulatory Visit
Admission: RE | Admit: 2023-04-14 | Discharge: 2023-04-14 | Disposition: A | Discharge status: Home / Self Care | Payer: Medicare HMO | Source: Ambulatory Visit | Attending: Radiation Oncology | Admitting: Radiation Oncology

## 2023-04-14 ENCOUNTER — Other Ambulatory Visit: Payer: Self-pay

## 2023-04-14 DIAGNOSIS — Z9181 History of falling: Secondary | ICD-10-CM | POA: Diagnosis not present

## 2023-04-14 DIAGNOSIS — G9389 Other specified disorders of brain: Secondary | ICD-10-CM | POA: Diagnosis not present

## 2023-04-14 DIAGNOSIS — F419 Anxiety disorder, unspecified: Secondary | ICD-10-CM | POA: Diagnosis not present

## 2023-04-14 DIAGNOSIS — K5900 Constipation, unspecified: Secondary | ICD-10-CM | POA: Diagnosis not present

## 2023-04-14 DIAGNOSIS — J4489 Other specified chronic obstructive pulmonary disease: Secondary | ICD-10-CM | POA: Diagnosis not present

## 2023-04-14 DIAGNOSIS — C50412 Malignant neoplasm of upper-outer quadrant of left female breast: Secondary | ICD-10-CM | POA: Diagnosis not present

## 2023-04-14 DIAGNOSIS — M48061 Spinal stenosis, lumbar region without neurogenic claudication: Secondary | ICD-10-CM | POA: Diagnosis not present

## 2023-04-14 DIAGNOSIS — I10 Essential (primary) hypertension: Secondary | ICD-10-CM | POA: Diagnosis not present

## 2023-04-14 DIAGNOSIS — D630 Anemia in neoplastic disease: Secondary | ICD-10-CM | POA: Diagnosis not present

## 2023-04-14 DIAGNOSIS — G40219 Localization-related (focal) (partial) symptomatic epilepsy and epileptic syndromes with complex partial seizures, intractable, without status epilepticus: Secondary | ICD-10-CM | POA: Diagnosis not present

## 2023-04-14 DIAGNOSIS — Z170 Estrogen receptor positive status [ER+]: Secondary | ICD-10-CM | POA: Diagnosis not present

## 2023-04-14 DIAGNOSIS — Z8616 Personal history of COVID-19: Secondary | ICD-10-CM | POA: Diagnosis not present

## 2023-04-14 DIAGNOSIS — I480 Paroxysmal atrial fibrillation: Secondary | ICD-10-CM | POA: Diagnosis not present

## 2023-04-14 DIAGNOSIS — I82A11 Acute embolism and thrombosis of right axillary vein: Secondary | ICD-10-CM | POA: Diagnosis not present

## 2023-04-14 DIAGNOSIS — G5793 Unspecified mononeuropathy of bilateral lower limbs: Secondary | ICD-10-CM | POA: Diagnosis not present

## 2023-04-14 DIAGNOSIS — Z87891 Personal history of nicotine dependence: Secondary | ICD-10-CM | POA: Diagnosis not present

## 2023-04-14 DIAGNOSIS — Z7901 Long term (current) use of anticoagulants: Secondary | ICD-10-CM | POA: Diagnosis not present

## 2023-04-14 LAB — RAD ONC ARIA SESSION SUMMARY
Course Elapsed Days: 27
Plan Fractions Treated to Date: 3
Plan Prescribed Dose Per Fraction: 2 Gy
Plan Total Fractions Prescribed: 6
Plan Total Prescribed Dose: 12 Gy
Reference Point Dosage Given to Date: 6 Gy
Reference Point Session Dosage Given: 2 Gy
Session Number: 18

## 2023-04-14 MED ORDER — RADIAPLEXRX EX GEL: Freq: Once | CUTANEOUS | Status: AC

## 2023-04-15 ENCOUNTER — Other Ambulatory Visit: Payer: Self-pay

## 2023-04-15 ENCOUNTER — Ambulatory Visit
Admission: RE | Admit: 2023-04-15 | Discharge: 2023-04-15 | Disposition: A | Discharge status: Home / Self Care | Payer: Medicare HMO | Source: Ambulatory Visit | Attending: Radiation Oncology | Admitting: Radiation Oncology

## 2023-04-15 DIAGNOSIS — Z170 Estrogen receptor positive status [ER+]: Secondary | ICD-10-CM | POA: Diagnosis not present

## 2023-04-15 DIAGNOSIS — C50412 Malignant neoplasm of upper-outer quadrant of left female breast: Secondary | ICD-10-CM | POA: Diagnosis not present

## 2023-04-15 LAB — RAD ONC ARIA SESSION SUMMARY
Course Elapsed Days: 28
Plan Fractions Treated to Date: 4
Plan Prescribed Dose Per Fraction: 2 Gy
Plan Total Fractions Prescribed: 6
Plan Total Prescribed Dose: 12 Gy
Reference Point Dosage Given to Date: 8 Gy
Reference Point Session Dosage Given: 2 Gy
Session Number: 19

## 2023-04-16 ENCOUNTER — Ambulatory Visit: Payer: Medicare HMO | Admitting: Neurology

## 2023-04-16 ENCOUNTER — Ambulatory Visit: Payer: Medicare HMO

## 2023-04-16 ENCOUNTER — Encounter: Payer: Self-pay | Admitting: Neurology

## 2023-04-16 ENCOUNTER — Other Ambulatory Visit: Payer: Self-pay

## 2023-04-16 ENCOUNTER — Ambulatory Visit
Admission: RE | Admit: 2023-04-16 | Discharge: 2023-04-16 | Disposition: A | Discharge status: Home / Self Care | Payer: Medicare HMO | Source: Ambulatory Visit | Attending: Radiation Oncology | Admitting: Radiation Oncology

## 2023-04-16 VITALS — BP 115/66 | HR 104 | Ht 67.0 in | Wt 129.0 lb

## 2023-04-16 DIAGNOSIS — Z510 Encounter for antineoplastic radiation therapy: Secondary | ICD-10-CM | POA: Diagnosis not present

## 2023-04-16 DIAGNOSIS — G629 Polyneuropathy, unspecified: Secondary | ICD-10-CM

## 2023-04-16 DIAGNOSIS — Z170 Estrogen receptor positive status [ER+]: Secondary | ICD-10-CM | POA: Diagnosis not present

## 2023-04-16 DIAGNOSIS — C50412 Malignant neoplasm of upper-outer quadrant of left female breast: Secondary | ICD-10-CM | POA: Diagnosis not present

## 2023-04-16 DIAGNOSIS — G40219 Localization-related (focal) (partial) symptomatic epilepsy and epileptic syndromes with complex partial seizures, intractable, without status epilepticus: Secondary | ICD-10-CM | POA: Diagnosis not present

## 2023-04-16 LAB — RAD ONC ARIA SESSION SUMMARY
Course Elapsed Days: 29
Plan Fractions Treated to Date: 5
Plan Prescribed Dose Per Fraction: 2 Gy
Plan Total Fractions Prescribed: 6
Plan Total Prescribed Dose: 12 Gy
Reference Point Dosage Given to Date: 10 Gy
Reference Point Session Dosage Given: 2 Gy
Session Number: 20

## 2023-04-16 MED ORDER — DILANTIN 100 MG PO CAPS: 100.0000 mg | ORAL_CAPSULE | Freq: Two times a day (BID) | ORAL | 3 refills | Status: DC

## 2023-04-16 MED ORDER — VIMPAT 200 MG PO TABS: 200.0000 mg | ORAL_TABLET | Freq: Two times a day (BID) | ORAL | 3 refills | Status: DC

## 2023-04-16 MED ORDER — LORAZEPAM 1 MG PO TABS: ORAL_TABLET | ORAL | 5 refills | Status: DC

## 2023-04-16 MED ORDER — DILANTIN 30 MG PO CAPS: ORAL_CAPSULE | ORAL | 3 refills | Status: DC

## 2023-04-16 NOTE — Patient Instructions (Signed)
Good to see you doing better. Continue all your medications. Follow-up in 6 months, call for any changes.    Seizure Precautions: 1. If medication has been prescribed for you to prevent seizures, take it exactly as directed.  Do not stop taking the medicine without talking to your doctor first, even if you have not had a seizure in a long time.   2. Avoid activities in which a seizure would cause danger to yourself or to others.  Don't operate dangerous machinery, swim alone, or climb in high or dangerous places, such as on ladders, roofs, or girders.  Do not drive unless your doctor says you may.  3. If you have any warning that you may have a seizure, lay down in a safe place where you can't hurt yourself.    4.  No driving for 6 months from last seizure, as per [facility].   Please refer to the following link on the Epile[employer] of [employer]'s website for more information: http://www.epilepsyfoundation.org/answerplace/Social/driving/drivingu.cfm   5.  Maintain good sleep hygiene.  6.  Contact your doctor if you have any problems that may be related to the medicine you are taking.  7.  Call 911 and bring the patient back to the ED if:        A.  The seizure lasts longer than 5 minutes.       B.  The patient doesn't awaken shortly after the seizure  C.  The patient has new problems such as difficulty seeing, speaking or moving  D.  The patient was injured during the seizure  E.  The patient has a temperature over 102 F (39C)  F.  The patient vomited and now is having trouble breathing

## 2023-04-16 NOTE — Progress Notes (Signed)
NEUROLOGY FOLLOW UP OFFICE NOTE  [name] [id number] 06/01/1956  HISTORY OF PRESENT ILLNESS: I had the pleasure of seeing [name] in follow-up in the neurology clinic on 04/16/2023.  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 She is again accompanied by her daughter [name] who helps supplement the history today.  Records and images were personally reviewed where available.  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 She had changed insurances and was without Vimpat for 2 days, she started having breakthrough GTCs, several that morning. She had inpatient vEEG monitoring with the first 24 hours capturing 56 seizure without clinical signs arising from the right posterior quadrant, lasting 45-60 seconds. Last seizure recorded was on 7/9. She had continuous monitoring until 7/12, EEG at that point showed sharp waves on the right posterior quadrant, continuous rhythmic slowing on the right hemisphere, maximal on the right posterior quadrant. MRI brain no acute changes, with large region of chronic encephalomalacia within the right parietal and occipital lobes, as well as callosal splenium, with chronic right hippocampal volume loss; mild diffuse atrophy. Initially Neurontin was added back on, but she did not do well. Onfi was added but she had a fever and was switched and discharged home on Topiramate. She continues on Vimpat 200mg  BID and Dilantin 100mg  in AM, 130mg  in PM. [name] contacted our office on 8/7 that immediately upon coming home, she was not herself, tremors were much worse and she was having a hard time walking on her own or without a walker. She was very drowsy. We agreed to wean off Topiramate. [name] reports significant improvement, she has been back to baseline on Vimpat 200mg  BID and Dilantin 100mg  in AM, 130mg  in PM and they feel this is really the best regimen for her. She has prn lorazepam for  rescue. The last seizure was 03/24/23 when she had 2 in one day, then fell the next day, complaining her feet were numb and could not walk. She was brought to the ER where MRI lumbar spine showed spondylosis at the lumbar and visualized lower thoracic levels, multilevel subarticular stenosis with progressive multifactorial mild/moderate right subarticular stenosis at L4-L5 (with some crowding of the descending right L5 nerve root); multilevel foraminal stenosis, greatest on the right at L4-L5 (mild-to-moderate at this site); redemonstrated chronic T11, T12 and L1 vertebral compression fractures; mild degenerative endplate edema at W0-J8; mild multilevel grade 1 spondylolisthesis.   She has some back pain that they attribute to laying on a flat board from radiation. She will be completing radiation this week. She reports that the supplements Neuroshield (B vitamins) help her neuropathy. She has not tolerated neuropathic medication trials in the past (Gabapentin, Lyrica). She is on Coumadin for DVT.    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 She had been seeing epileptologist Dr. Doreen Beam, records were reviewed and will be summarized as follows. In the past, seizures would start with headache and burning sensation of the left arm, which may be followed by left arm jerking. Over time, she has not had any prior warning symptoms. She has had seizures where she is noted to have kicking and rocking movements of the pelvis with unresponsiveness. She was in the EMU in 10/2012 where  11 seizures were captured from the right posterior hemisphere/right inferior temporal region. Interictal EEG showed abundant right posterior temporal epileptiform discharges, frequent right anterior temporal  epileptiform discharges, as well as occasional independent left anterior temporal epileptiform discharges. There was focal slowing over the right posterior hemisphere. MRI brain in 2014 showed right parietal/occipital encephalomalacia, right hippocampus smaller than left. PET scan in 2015 showed multiple areas of hypometabolism on the right, mostly corresponding to or adjacent to the structural abnormality. Ictal SPECT showed right hemisphere hypoperfusion. SISCOM showed right frontal region significant on the subtraction. MEG in 07/2013 showed spikes localizing to the right occipital, parietal, mesial temporal regions with a weak signal left mesial parietal. Concomitant EEG showed right posterior spikes. Functional mapping suggestive of left language lateralization. She has tried multiple AEDs in the past with various side effects or ineffective, and has been taking Dilantin 160mg  in AM, 200mg  in PM and Vimpat 200mg  BID with seizures around once a month. She was admitted to [facility] on 03/19/18 for left leg weakness and drowsiness. She was fishing with her boyfriend when she suddenly could not move her left leg and almost fell. She states that her boyfriend told her that she was shaking and had a seizure while they were fishing. She was brought to [facility] where Dilantin level was 33.8. She was adamant that she has been taking her medications as instructed and did not take more than prescribed, no new medications taken. She had an MRI brain without contrast which I personally reviewed, no acute changes seen. Large volume right parieto-occipital encephalomalacia was unchanged. Right hippocampus is smaller. She continued to report weakness and had an MRI cervical and lumbar spine which did not show any myelopathy, there was prominent left-sided facet hypertrophy at C4-5 with resultant moderate left C5 foraminal stenosis, degenerative disc osteophyte at C5-6 with severe right C6 foraminal stenosis, and mild spinal stenosis at  C5-6 and C6-7, chronic borderline to mild multifactorial spinal stenosis at L2-3 through L4-5 with no convincing lateral recess or foraminal stenosis. She continued to report burning pain in her legs and was started on low dose gabapentin 300mg  qhs with no side effects. Dilantin was held in the hospital, she was discharged on a lower dose, but states that she has been taking her pre-hospital dose of 160mg  in AM, 200mg  in PM but has had 4 seizures since hospital discharge 2 weeks ago, which is unusual. She had 2 seizures in one day in the car with her mother-in-law, her mother-in-law had to pull over. The next day she had 2 at home, her daughter found her in the bathroom. She has not been sleeping well the past few days. She has a headache after the seizures. Aside from the convulsions, she has episodes where a feeling comes over her left side, from the arm down her leg, then she is weaker after. She denies any jerking. She is quite anxious in the office today and perseverates on her symptoms. She states she "stays nervous." She reports taking prn Ativan in the past, but discontinued at [facility]. She repeatedly states her feet are numb and they "feel like they are grabbed." She feels gabapentin has helped some with this.    Prior AEDs: phenobarbital (ineffective, mood issues), Depakote (multiple trials, ineffective), Keppra (ineffective), Zonisamide (side effects on brief trial of higher dose than prescribed - 500mg  instead of 200mg ), Onfi (mouth sores), Tegretol (ineffective), Lamictal (nausea, dizziness, malaise), Felbatol (nausea, dizziness, malaise), Topamax (ineffective), Tigabine (tried per notes, details unclear), Neurontin (ineffective,  nausea), Lyrica (leg pain reported on 25mg  daily, tingling), Briviact (drowsiness), Fycompa, Cenobamate   Epilepsy Risk Factors:  Right parieto-occipital AVM s/p rupture and hemorrhage in 1988, s/p craniectomy. Otherwise she had a normal birth and early development.  There  is no history of febrile convulsions, CNS infections such as meningitis/encephalitis, or family history of seizures.  Diagnostic Data:  MRI brain without contrast 05/2018 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  EEG done 06/2018 showed right posterior temporal slowing, occasional epileptiform discharges over the right posterior temporal region, breach artifact in this region.   MRI cervical, thoracic, and lumbar spine done 07/2018 showed no convincing cervical or thoracic spinal cord abnormality. Mild degenerative cervical spinal stenosis at C5-6 and C6-7 with moderate or severe bilateral foraminal stenosis; no thoracic spinal or foraminal stenosis; stable borderline to mild lumbar spinal stenosis  MRI lumbar spine in 2019 showed borderline to mild multifactorial spinal stenosis at L2-L3 and L3-L4 related to circumferential disc bulge and posterior element Hypertrophy.  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     PAST MEDICAL HISTORY: Past Medical History:  Diagnosis Date   Acute bronchitis 08/06/2022   Acute respiratory failure with hypoxia (HCC) 03/31/2015   Altered mental status    Asthma    Ataxia 08/26/2019   Atypical chest pain 02/17/2017   Cerebral hemorrhage (HCC) 1989   Chronic back pain    Closed fracture of distal end of left radius 07/17/2022   Closed fracture of fifth metatarsal bone 01/04/2019   Closed fracture of proximal phalanx of great toe 01/04/2019   Closed nondisplaced fracture of styloid process of left radius 08/06/2022   COPD (chronic obstructive pulmonary disease) (HCC)    no meds per daughter   COPD with exacerbation (HCC) 03/30/2015   Costochondritis 02/17/2017   COVID-19 virus infection 04/02/2020   DDD (degenerative disc disease) 06/12/2013   DVT (deep venous thrombosis)  (HCC)    "she's had several since 1990"   GERD (gastroesophageal reflux disease)    no meds per daughter   Headache    "usually around time when she's had a seizure"   History of blood transfusion    "when she was a baby"   History of kidney stones    History of stomach ulcers    Hypoxia 01/18/2015   Lactic acidosis 04/02/2020   Neuropathy    Seizures (HCC)    "because of her brain surgery"  last one 07/28/22   Sepsis secondary to UTI (HCC) 01/17/2015   Stroke [facility]) 1990's   family denies residual on 11/09/2014   Tunnel vision    "since brain OR"    MEDICATIONS: Current Outpatient Medications on File Prior to Visit  Medication Sig Dispense Refill   acetaminophen (TYLENOL) 325 MG tablet Take 1-2 tablets (325-650 mg total) by mouth every 4 (four) hours as needed for mild pain.     cyanocobalamin (VITAMIN B12) 1000 MCG tablet Take 2 tablets (2,000 mcg total) by mouth daily. 60 tablet 0   diclofenac Sodium (VOLTAREN) 1 % GEL Apply 2 g topically 4 (four) times daily as needed. To top of right foot 100 g 0   DILANTIN 100 MG ER capsule Take 1 capsule (100 mg total) by mouth 2 (two) times daily. 180 capsule 3   DILANTIN 30 MG ER capsule TAKE 1 CAPSULE BY MOUTH EVERY  NIGHT ALONG WITH 100  MG CAPSULE  FOR A TOTAL EVERY NIGHT DOSE OF  130 MG (Patient taking differently: Take 30 mg by mouth daily. TAKE 1 CAPSULE BY MOUTH EVERY morning ALONG WITH 100 MG CAPSULE  FOR A TOTAL morning DOSE OF 130 MG) 100 capsule 3   famotidine (PEPCID) 20 MG tablet 1 tablet Orally twice a day for 90 days     folic acid (FOLVITE) 1 MG tablet Take 1 tablet (1 mg total) by mouth daily. 30 tablet 0   gabapentin (NEURONTIN) 100 MG capsule Take 1 capsule (100 mg total) by mouth 3 (three) times daily. (Patient not taking: Reported on 04/07/2023) 90 capsule 0   LORazepam (ATIVAN) 1 MG tablet TAKE 1 TABLET BY MOUTH AS DIRECTED AS NEEDED FOR 3 OR MORE SEIZURES IN 24 HOURS. DO NOT TAKE MORE THAN 2 TABLETS IN 24 HOURS. Strength: 1  mg 10 tablet 5   oxybutynin (DITROPAN-XL) 5 MG 24 hr tablet Take 1 tablet (5 mg total) by mouth at bedtime. (Patient not taking: Reported on 04/07/2023) 30 tablet 0   potassium chloride SA (KLOR-CON M) 20 MEQ tablet Take 1 tablet (20 mEq total) by mouth every other day. 15 tablet 0   topiramate (TOPAMAX) 50 MG tablet Take 1 tablet (50 mg total) by mouth every morning AND 2 tablets (100 mg total) at bedtime. (Patient not taking: Reported on 04/07/2023) 90 tablet 0   VIMPAT 200 MG TABS tablet Take 1 tablet (200 mg total) by mouth 2 (two) times daily.     warfarin (COUMADIN) 6 MG tablet Take 1 tablet (6 mg total) by mouth daily at 4 PM. 30 tablet 1   No current facility-administered medications on file prior to visit.    ALLERGIES: Allergies  Allergen Reactions   Zonegran [Zonisamide] Other (See Comments)    Numbness and tingling over whole body   Chocolate Other (See Comments)    Triggers seizures   Codeine Nausea And Vomiting   Keppra [Levetiracetam] Other (See Comments)    Causes seizures   Lyrica [Pregabalin] Nausea And Vomiting   Olive Oil Rash    FAMILY HISTORY: Family History  Problem Relation Age of Onset   Colon cancer Mother        started as vaginal cancer   Heart attack Mother        4 MIs, started in her 64s, also vaginal cancer and colon cancer   Cancer Mother    Heart attack Father        Died suddenly age 67 - autopsy revealed heart attack her patient   Cancer Brother        Sinus cancer   Cervical cancer Maternal Aunt    Rectal cancer Neg Hx    Stomach cancer Neg Hx    Esophageal cancer Neg Hx     SOCIAL HISTORY: Social History   Socioeconomic History   Marital status: Divorced    Spouse name: Not on file   Number of children: 3   Years of education: Not on file   Highest education level: Not on file  Occupational History   Not on file  Tobacco Use   Smoking status: Former    Current packs/day: 0.00    Average packs/day: 0.5 packs/day for 20.0 years  (10.0 ttl pk-yrs)    Types: Cigarettes    Start date: 10/26/1997    Quit date: 10/26/2017    Years since quitting: 5.4   Smokeless tobacco: Never   Tobacco comments:    Previously smoked  2 ppd, down to 4 cigs/day, patient quit smoking in 2019  Vaping Use   Vaping status: Never Used  Substance and Sexual Activity   Alcohol use: No   Drug use: No   Sexual activity: Never    Birth control/protection: Post-menopausal  Other Topics Concern   Not on file  Social History Narrative   Pt lives in 2 story home with her daughter, daughter's family and pt's mother-in-law   Has 2 living children / 1 deceased   05/14/2023 grade education   Worked for [employer] until 1998 when she became disabled.    Right handed   Social Determinants of Health   Financial Resource Strain: Low Risk  (12/31/2022)   Overall Financial Resource Strain (CARDIA)    Difficulty of Paying Living Expenses: Not very hard  Food Insecurity: No Food Insecurity (03/11/2023)   Hunger Vital Sign    Worried About Running Out of Food in the Last Year: Never true    Ran Out of Food in the Last Year: Never true  Transportation Needs: No Transportation Needs (03/11/2023)   PRAPARE - Administrator, Civil Service (Medical): No    Lack of Transportation (Non-Medical): No  Physical Activity: Not on file  Stress: Not on file  Social Connections: Not on file  Intimate Partner Violence: Not At Risk (03/11/2023)   Humiliation, Afraid, Rape, and Kick questionnaire    Fear of Current or Ex-Partner: No    Emotionally Abused: No    Physically Abused: No    Sexually Abused: No     PHYSICAL EXAM: Vitals:   05/14/23 1045  BP: 115/66  Pulse: (!) 104  SpO2: 95%   General: No acute distress Head:  Normocephalic/atraumatic. Torticollis with head slightly turned to the right and side to side tremor seen Skin/Extremities: No rash, no edema Neurological Exam: alert and awake. No aphasia or dysarthria. Fund of knowledge is appropriate.  Attention and concentration are normal.   Cranial nerves: Pupils equal, round. Extraocular movements intact with no nystagmus. Visual fields full.  No facial asymmetry.  Motor: Bulk and tone normal, muscle strength 5/5 throughout with no pronator drift.   Finger to nose testing intact.  Gait favors the left leg, no ataxia.    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 She has tried multiple AEDs in the past with various side effects. She has had several admissions for Dilantin toxicity and cannot tolerate higher doses of Dilantin. There is no need to increase dose even if she has subtherapeutic levels in the future, adjustments may be made if level is supratherapeutic and she is symptomatic. She had a recent hospitalization for nonconvulsive status epilepticus arising from the right posterior quadrant, in the setting of inability to obtain her Vimpat for a few days. She did not tolerate additional medications (Neurontin, Onfi, Topiramate). She is back on home doses of Dilantin 100mg  in AM, 130mg  in PM, Vimpat 200mg  BID, they report this is the best regimen for her. She has prn lorazepam for rescue. Last seizure was 03/24/23. She did not tolerate neuropathic pain medications, she does have subarticular stenosis at several levels in her lumbar spine, we may consider referral to Ortho/Pain management for injections if pain continues. She does not drive. Follow-up in 6 months, call for any changes.    Thank you for allowing me  to participate in her care.  Please do not hesitate to call for any questions or concerns.   Patrcia Dolly, M.D.   CC: Jorge Ny, FNP

## 2023-04-17 ENCOUNTER — Other Ambulatory Visit: Payer: Self-pay

## 2023-04-17 ENCOUNTER — Ambulatory Visit
Admission: RE | Admit: 2023-04-17 | Discharge: 2023-04-17 | Disposition: A | Discharge status: Home / Self Care | Payer: Medicare HMO | Source: Ambulatory Visit | Attending: Radiation Oncology | Admitting: Radiation Oncology

## 2023-04-17 DIAGNOSIS — C50412 Malignant neoplasm of upper-outer quadrant of left female breast: Secondary | ICD-10-CM | POA: Diagnosis not present

## 2023-04-17 DIAGNOSIS — Z170 Estrogen receptor positive status [ER+]: Secondary | ICD-10-CM | POA: Diagnosis not present

## 2023-04-17 LAB — RAD ONC ARIA SESSION SUMMARY
Course Elapsed Days: 30
Plan Fractions Treated to Date: 6
Plan Prescribed Dose Per Fraction: 2 Gy
Plan Total Fractions Prescribed: 6
Plan Total Prescribed Dose: 12 Gy
Reference Point Dosage Given to Date: 12 Gy
Reference Point Session Dosage Given: 2 Gy
Session Number: 21

## 2023-04-20 DIAGNOSIS — G5793 Unspecified mononeuropathy of bilateral lower limbs: Secondary | ICD-10-CM | POA: Diagnosis not present

## 2023-04-20 DIAGNOSIS — J4489 Other specified chronic obstructive pulmonary disease: Secondary | ICD-10-CM | POA: Diagnosis not present

## 2023-04-20 DIAGNOSIS — Z9181 History of falling: Secondary | ICD-10-CM | POA: Diagnosis not present

## 2023-04-20 DIAGNOSIS — G40219 Localization-related (focal) (partial) symptomatic epilepsy and epileptic syndromes with complex partial seizures, intractable, without status epilepticus: Secondary | ICD-10-CM | POA: Diagnosis not present

## 2023-04-20 DIAGNOSIS — G9389 Other specified disorders of brain: Secondary | ICD-10-CM | POA: Diagnosis not present

## 2023-04-20 DIAGNOSIS — Z7901 Long term (current) use of anticoagulants: Secondary | ICD-10-CM | POA: Diagnosis not present

## 2023-04-20 DIAGNOSIS — F419 Anxiety disorder, unspecified: Secondary | ICD-10-CM | POA: Diagnosis not present

## 2023-04-20 DIAGNOSIS — C50412 Malignant neoplasm of upper-outer quadrant of left female breast: Secondary | ICD-10-CM | POA: Diagnosis not present

## 2023-04-20 DIAGNOSIS — M48061 Spinal stenosis, lumbar region without neurogenic claudication: Secondary | ICD-10-CM | POA: Diagnosis not present

## 2023-04-20 DIAGNOSIS — I480 Paroxysmal atrial fibrillation: Secondary | ICD-10-CM | POA: Diagnosis not present

## 2023-04-20 DIAGNOSIS — K5900 Constipation, unspecified: Secondary | ICD-10-CM | POA: Diagnosis not present

## 2023-04-20 DIAGNOSIS — D630 Anemia in neoplastic disease: Secondary | ICD-10-CM | POA: Diagnosis not present

## 2023-04-20 DIAGNOSIS — I82A11 Acute embolism and thrombosis of right axillary vein: Secondary | ICD-10-CM | POA: Diagnosis not present

## 2023-04-20 DIAGNOSIS — Z170 Estrogen receptor positive status [ER+]: Secondary | ICD-10-CM | POA: Diagnosis not present

## 2023-04-20 DIAGNOSIS — Z87891 Personal history of nicotine dependence: Secondary | ICD-10-CM | POA: Diagnosis not present

## 2023-04-20 DIAGNOSIS — Z8616 Personal history of COVID-19: Secondary | ICD-10-CM | POA: Diagnosis not present

## 2023-04-20 DIAGNOSIS — I10 Essential (primary) hypertension: Secondary | ICD-10-CM | POA: Diagnosis not present

## 2023-04-20 NOTE — Radiation Completion Notes (Signed)
Patient Name: [name], [name] MRN: [id number] Date of Birth: Jul 28, 1956 Referring Physician: [name], M.D. Date of Service: 2023-04-20 Radiation Oncologist: Arnette Schaumann, M.D. [facility]                             RADIATION ONCOLOGY END OF TREATMENT NOTE     Diagnosis: C50.412 Malignant neoplasm of upper-outer quadrant of left female breast Staging on 2022-12-31: Malignant neoplasm of upper-outer quadrant of left breast in female, estrogen receptor positive (HCC) T=cT1c, N=cN0, M=cM0 Intent: Curative     ==========DELIVERED PLANS==========  First Treatment Date: 2023-03-18 - Last Treatment Date: 2023-04-17   Plan Name: Breast_L Site: Breast, Left Technique: 3D Mode: Photon Dose Per Fraction: 2.67 Gy Prescribed Dose (Delivered / Prescribed): 40.05 Gy / 40.05 Gy Prescribed Fxs (Delivered / Prescribed): 15 / 15   Plan Name: Breast_Lt_Bst Site: Breast, Left Technique: 3D Mode: Photon Dose Per Fraction: 2 Gy Prescribed Dose (Delivered / Prescribed): 12 Gy / 12 Gy Prescribed Fxs (Delivered / Prescribed): 6 / 6     ==========ON TREATMENT VISIT DATES========== 2023-03-24, 2023-03-31, 2023-04-08, 2023-04-14     ==========UPCOMING VISITS==========       ==========APPENDIX - ON TREATMENT VISIT NOTES==========   See weekly On Treatment Notes in Epic for details.

## 2023-04-21 DIAGNOSIS — Z7901 Long term (current) use of anticoagulants: Secondary | ICD-10-CM | POA: Diagnosis not present

## 2023-04-27 ENCOUNTER — Other Ambulatory Visit (HOSPITAL_COMMUNITY): Payer: Self-pay | Admitting: Otolaryngology

## 2023-04-27 DIAGNOSIS — J4489 Other specified chronic obstructive pulmonary disease: Secondary | ICD-10-CM | POA: Diagnosis not present

## 2023-04-27 DIAGNOSIS — G5793 Unspecified mononeuropathy of bilateral lower limbs: Secondary | ICD-10-CM | POA: Diagnosis not present

## 2023-04-27 DIAGNOSIS — M48061 Spinal stenosis, lumbar region without neurogenic claudication: Secondary | ICD-10-CM | POA: Diagnosis not present

## 2023-04-27 DIAGNOSIS — D630 Anemia in neoplastic disease: Secondary | ICD-10-CM | POA: Diagnosis not present

## 2023-04-27 DIAGNOSIS — E041 Nontoxic single thyroid nodule: Secondary | ICD-10-CM

## 2023-04-27 DIAGNOSIS — Z7901 Long term (current) use of anticoagulants: Secondary | ICD-10-CM | POA: Diagnosis not present

## 2023-04-27 DIAGNOSIS — Z9181 History of falling: Secondary | ICD-10-CM | POA: Diagnosis not present

## 2023-04-27 DIAGNOSIS — Z170 Estrogen receptor positive status [ER+]: Secondary | ICD-10-CM | POA: Diagnosis not present

## 2023-04-27 DIAGNOSIS — K5900 Constipation, unspecified: Secondary | ICD-10-CM | POA: Diagnosis not present

## 2023-04-27 DIAGNOSIS — Z8616 Personal history of COVID-19: Secondary | ICD-10-CM | POA: Diagnosis not present

## 2023-04-27 DIAGNOSIS — I10 Essential (primary) hypertension: Secondary | ICD-10-CM | POA: Diagnosis not present

## 2023-04-27 DIAGNOSIS — I480 Paroxysmal atrial fibrillation: Secondary | ICD-10-CM | POA: Diagnosis not present

## 2023-04-27 DIAGNOSIS — G9389 Other specified disorders of brain: Secondary | ICD-10-CM | POA: Diagnosis not present

## 2023-04-27 DIAGNOSIS — G40219 Localization-related (focal) (partial) symptomatic epilepsy and epileptic syndromes with complex partial seizures, intractable, without status epilepticus: Secondary | ICD-10-CM | POA: Diagnosis not present

## 2023-04-27 DIAGNOSIS — I82A11 Acute embolism and thrombosis of right axillary vein: Secondary | ICD-10-CM | POA: Diagnosis not present

## 2023-04-27 DIAGNOSIS — F419 Anxiety disorder, unspecified: Secondary | ICD-10-CM | POA: Diagnosis not present

## 2023-04-27 DIAGNOSIS — C50412 Malignant neoplasm of upper-outer quadrant of left female breast: Secondary | ICD-10-CM | POA: Diagnosis not present

## 2023-04-27 DIAGNOSIS — Z87891 Personal history of nicotine dependence: Secondary | ICD-10-CM | POA: Diagnosis not present

## 2023-04-29 ENCOUNTER — Inpatient Hospital Stay: Payer: Medicare HMO | Admitting: Hematology and Oncology

## 2023-05-04 DIAGNOSIS — Z7901 Long term (current) use of anticoagulants: Secondary | ICD-10-CM | POA: Diagnosis not present

## 2023-06-09 ENCOUNTER — Encounter: Payer: Self-pay | Admitting: Hematology and Oncology

## 2023-06-11 ENCOUNTER — Other Ambulatory Visit: Payer: Self-pay | Admitting: Hematology and Oncology

## 2023-06-11 MED ORDER — ANASTROZOLE 1 MG PO TABS: 1.0000 mg | ORAL_TABLET | Freq: Every day | ORAL | 3 refills | Status: DC

## 2023-06-11 NOTE — Progress Notes (Signed)
Anastrozole dispensed to [facility], [place].  [name]

## 2023-06-14 ENCOUNTER — Encounter (HOSPITAL_COMMUNITY): Payer: Self-pay

## 2023-06-14 ENCOUNTER — Emergency Department (HOSPITAL_COMMUNITY): Payer: Medicare HMO

## 2023-06-14 ENCOUNTER — Inpatient Hospital Stay (HOSPITAL_COMMUNITY)
Admission: EM | Admit: 2023-06-14 | Discharge: 2023-06-23 | DRG: 506 | Disposition: A | Discharge status: Home / Self Care | Payer: Medicare HMO | Attending: Internal Medicine | Admitting: Internal Medicine

## 2023-06-14 DIAGNOSIS — Z8673 Personal history of transient ischemic attack (TIA), and cerebral infarction without residual deficits: Secondary | ICD-10-CM

## 2023-06-14 DIAGNOSIS — Z8049 Family history of malignant neoplasm of other genital organs: Secondary | ICD-10-CM

## 2023-06-14 DIAGNOSIS — R9431 Abnormal electrocardiogram [ECG] [EKG]: Secondary | ICD-10-CM | POA: Diagnosis not present

## 2023-06-14 DIAGNOSIS — S52502A Unspecified fracture of the lower end of left radius, initial encounter for closed fracture: Secondary | ICD-10-CM | POA: Diagnosis not present

## 2023-06-14 DIAGNOSIS — M47816 Spondylosis without myelopathy or radiculopathy, lumbar region: Secondary | ICD-10-CM | POA: Diagnosis not present

## 2023-06-14 DIAGNOSIS — J439 Emphysema, unspecified: Secondary | ICD-10-CM | POA: Diagnosis not present

## 2023-06-14 DIAGNOSIS — S62303A Unspecified fracture of third metacarpal bone, left hand, initial encounter for closed fracture: Secondary | ICD-10-CM | POA: Diagnosis not present

## 2023-06-14 DIAGNOSIS — S52302A Unspecified fracture of shaft of left radius, initial encounter for closed fracture: Principal | ICD-10-CM | POA: Diagnosis present

## 2023-06-14 DIAGNOSIS — Z888 Allergy status to other drugs, medicaments and biological substances status: Secondary | ICD-10-CM

## 2023-06-14 DIAGNOSIS — G40909 Epilepsy, unspecified, not intractable, without status epilepticus: Secondary | ICD-10-CM

## 2023-06-14 DIAGNOSIS — S5292XA Unspecified fracture of left forearm, initial encounter for closed fracture: Secondary | ICD-10-CM | POA: Diagnosis not present

## 2023-06-14 DIAGNOSIS — Z8616 Personal history of COVID-19: Secondary | ICD-10-CM

## 2023-06-14 DIAGNOSIS — W010XXA Fall on same level from slipping, tripping and stumbling without subsequent striking against object, initial encounter: Secondary | ICD-10-CM | POA: Diagnosis present

## 2023-06-14 DIAGNOSIS — Y92009 Unspecified place in unspecified non-institutional (private) residence as the place of occurrence of the external cause: Secondary | ICD-10-CM

## 2023-06-14 DIAGNOSIS — W19XXXA Unspecified fall, initial encounter: Secondary | ICD-10-CM

## 2023-06-14 DIAGNOSIS — C50919 Malignant neoplasm of unspecified site of unspecified female breast: Secondary | ICD-10-CM

## 2023-06-14 DIAGNOSIS — G8929 Other chronic pain: Secondary | ICD-10-CM | POA: Diagnosis present

## 2023-06-14 DIAGNOSIS — Z802 Family history of malignant neoplasm of other respiratory and intrathoracic organs: Secondary | ICD-10-CM

## 2023-06-14 DIAGNOSIS — Z91018 Allergy to other foods: Secondary | ICD-10-CM

## 2023-06-14 DIAGNOSIS — S3993XA Unspecified injury of pelvis, initial encounter: Secondary | ICD-10-CM | POA: Diagnosis not present

## 2023-06-14 DIAGNOSIS — E876 Hypokalemia: Secondary | ICD-10-CM | POA: Diagnosis present

## 2023-06-14 DIAGNOSIS — Z87891 Personal history of nicotine dependence: Secondary | ICD-10-CM

## 2023-06-14 DIAGNOSIS — E785 Hyperlipidemia, unspecified: Secondary | ICD-10-CM | POA: Diagnosis present

## 2023-06-14 DIAGNOSIS — I672 Cerebral atherosclerosis: Secondary | ICD-10-CM | POA: Diagnosis not present

## 2023-06-14 DIAGNOSIS — G629 Polyneuropathy, unspecified: Secondary | ICD-10-CM | POA: Diagnosis present

## 2023-06-14 DIAGNOSIS — K5909 Other constipation: Secondary | ICD-10-CM | POA: Diagnosis not present

## 2023-06-14 DIAGNOSIS — D539 Nutritional anemia, unspecified: Secondary | ICD-10-CM | POA: Insufficient documentation

## 2023-06-14 DIAGNOSIS — Z8249 Family history of ischemic heart disease and other diseases of the circulatory system: Secondary | ICD-10-CM

## 2023-06-14 DIAGNOSIS — I10 Essential (primary) hypertension: Secondary | ICD-10-CM | POA: Diagnosis present

## 2023-06-14 DIAGNOSIS — M160 Bilateral primary osteoarthritis of hip: Secondary | ICD-10-CM | POA: Diagnosis not present

## 2023-06-14 DIAGNOSIS — S62323A Displaced fracture of shaft of third metacarpal bone, left hand, initial encounter for closed fracture: Secondary | ICD-10-CM | POA: Diagnosis not present

## 2023-06-14 DIAGNOSIS — S62393A Other fracture of third metacarpal bone, left hand, initial encounter for closed fracture: Secondary | ICD-10-CM | POA: Diagnosis present

## 2023-06-14 DIAGNOSIS — S299XXA Unspecified injury of thorax, initial encounter: Secondary | ICD-10-CM | POA: Diagnosis not present

## 2023-06-14 DIAGNOSIS — Z95828 Presence of other vascular implants and grafts: Secondary | ICD-10-CM

## 2023-06-14 DIAGNOSIS — S52209A Unspecified fracture of shaft of unspecified ulna, initial encounter for closed fracture: Secondary | ICD-10-CM

## 2023-06-14 DIAGNOSIS — I959 Hypotension, unspecified: Secondary | ICD-10-CM | POA: Diagnosis not present

## 2023-06-14 DIAGNOSIS — D61818 Other pancytopenia: Secondary | ICD-10-CM | POA: Diagnosis present

## 2023-06-14 DIAGNOSIS — D696 Thrombocytopenia, unspecified: Secondary | ICD-10-CM | POA: Diagnosis present

## 2023-06-14 DIAGNOSIS — S62309A Unspecified fracture of unspecified metacarpal bone, initial encounter for closed fracture: Secondary | ICD-10-CM

## 2023-06-14 DIAGNOSIS — S52692A Other fracture of lower end of left ulna, initial encounter for closed fracture: Secondary | ICD-10-CM | POA: Diagnosis present

## 2023-06-14 DIAGNOSIS — Z7901 Long term (current) use of anticoagulants: Secondary | ICD-10-CM

## 2023-06-14 DIAGNOSIS — I82A21 Chronic embolism and thrombosis of right axillary vein: Secondary | ICD-10-CM | POA: Insufficient documentation

## 2023-06-14 DIAGNOSIS — Z923 Personal history of irradiation: Secondary | ICD-10-CM

## 2023-06-14 DIAGNOSIS — S52602A Unspecified fracture of lower end of left ulna, initial encounter for closed fracture: Secondary | ICD-10-CM | POA: Diagnosis not present

## 2023-06-14 DIAGNOSIS — F411 Generalized anxiety disorder: Secondary | ICD-10-CM | POA: Insufficient documentation

## 2023-06-14 DIAGNOSIS — Z79811 Long term (current) use of aromatase inhibitors: Secondary | ICD-10-CM

## 2023-06-14 DIAGNOSIS — Z043 Encounter for examination and observation following other accident: Secondary | ICD-10-CM | POA: Diagnosis not present

## 2023-06-14 DIAGNOSIS — Z800 Family history of malignant neoplasm of digestive organs: Secondary | ICD-10-CM

## 2023-06-14 DIAGNOSIS — R0689 Other abnormalities of breathing: Secondary | ICD-10-CM | POA: Diagnosis not present

## 2023-06-14 DIAGNOSIS — K219 Gastro-esophageal reflux disease without esophagitis: Secondary | ICD-10-CM | POA: Diagnosis present

## 2023-06-14 DIAGNOSIS — S3991XA Unspecified injury of abdomen, initial encounter: Secondary | ICD-10-CM | POA: Diagnosis not present

## 2023-06-14 DIAGNOSIS — Z86718 Personal history of other venous thrombosis and embolism: Secondary | ICD-10-CM

## 2023-06-14 DIAGNOSIS — Z853 Personal history of malignant neoplasm of breast: Secondary | ICD-10-CM

## 2023-06-14 DIAGNOSIS — Z79899 Other long term (current) drug therapy: Secondary | ICD-10-CM

## 2023-06-14 DIAGNOSIS — Z885 Allergy status to narcotic agent status: Secondary | ICD-10-CM

## 2023-06-14 DIAGNOSIS — I480 Paroxysmal atrial fibrillation: Secondary | ICD-10-CM | POA: Diagnosis present

## 2023-06-14 DIAGNOSIS — J449 Chronic obstructive pulmonary disease, unspecified: Secondary | ICD-10-CM | POA: Diagnosis present

## 2023-06-14 DIAGNOSIS — S62102A Fracture of unspecified carpal bone, left wrist, initial encounter for closed fracture: Principal | ICD-10-CM

## 2023-06-14 DIAGNOSIS — I619 Nontraumatic intracerebral hemorrhage, unspecified: Secondary | ICD-10-CM | POA: Insufficient documentation

## 2023-06-14 LAB — CBC
HCT: 37.1 % (ref 36.0–46.0)
Hemoglobin: 11.9 g/dL — ABNORMAL LOW (ref 12.0–15.0)
MCH: 32.7 pg (ref 26.0–34.0)
MCHC: 32.1 g/dL (ref 30.0–36.0)
MCV: 101.9 fL — ABNORMAL HIGH (ref 80.0–100.0)
Platelets: 113 10*3/uL — ABNORMAL LOW (ref 150–400)
RBC: 3.64 MIL/uL — ABNORMAL LOW (ref 3.87–5.11)
RDW: 12.5 % (ref 11.5–15.5)
WBC: 4.8 10*3/uL (ref 4.0–10.5)
nRBC: 0 % (ref 0.0–0.2)

## 2023-06-14 LAB — BASIC METABOLIC PANEL
Anion gap: 11 (ref 5–15)
BUN: 15 mg/dL (ref 8–23)
CO2: 23 mmol/L (ref 22–32)
Calcium: 8.9 mg/dL (ref 8.9–10.3)
Chloride: 106 mmol/L (ref 98–111)
Creatinine, Ser: 0.69 mg/dL (ref 0.44–1.00)
GFR, Estimated: >60 mL/min (ref >60)
Glucose, Bld: 147 mg/dL — ABNORMAL HIGH (ref 70–99)
Potassium: 3.3 mmol/L — ABNORMAL LOW (ref 3.5–5.1)
Sodium: 140 mmol/L (ref 135–145)

## 2023-06-14 LAB — PROTIME-INR
INR: 2.4 — ABNORMAL HIGH (ref 0.8–1.2)
Prothrombin Time: 26 s — ABNORMAL HIGH (ref 11.4–15.2)

## 2023-06-14 MED ORDER — ONDANSETRON HCL 4 MG/2ML IJ SOLN
4.0000 mg | Freq: Once | INTRAMUSCULAR | Status: AC
  Administered 2023-06-14: 4 mg via INTRAVENOUS
  Filled 2023-06-14: qty 2

## 2023-06-14 MED ORDER — MORPHINE SULFATE (PF) 4 MG/ML IV SOLN
4.0000 mg | Freq: Once | INTRAVENOUS | Status: AC
  Administered 2023-06-15: 4 mg via INTRAVENOUS
  Filled 2023-06-14: qty 1

## 2023-06-14 MED ORDER — MORPHINE SULFATE (PF) 4 MG/ML IV SOLN
4.0000 mg | Freq: Once | INTRAVENOUS | Status: AC
  Administered 2023-06-14: 4 mg via INTRAVENOUS
  Filled 2023-06-14: qty 1

## 2023-06-14 NOTE — ED Triage Notes (Signed)
Pt is coming from home, pt states she was walking he foot slipped and caused her fall, deformed fracture to left wrist, she is on a blood thinner. Medic gave her of fentanyl and splinted the wrist.  133/62 84pr 98%ra

## 2023-06-14 NOTE — ED Provider Notes (Signed)
[place] [facility]GENCY DEPARTMENT AT [facility] Provider Note   CSN: [id number] Arrival date & time: 06/14/23  2128     History {Add pertinent medical, surgical, social history, OB history to HPI:1} Chief Complaint  Patient presents with   Kharmen Buschmann is a 67 y.o. 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  She presents after a mechanical fall.  Patient's foot slipped causing her to fall.  She landed on her left upper extremity.  Unknown if she hit her head.  She had immediate pain and swelling to the left wrist and also left shoulder pain.  EMS noted deformity to left upper extremity and placed a temporary splint.  They gave her 200 mcg of fentanyl.   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 Melvyn Novas here.   The history is provided by the patient, the EMS personnel and medical records.       Home Medications Prior to Admission medications   Medication Sig Start Date End Date Taking? Authorizing Provider  acetaminophen (TYLENOL) 325 MG tablet Take 1-2 tablets (325-650 mg total) by mouth every 4 (four) hours as needed for mild pain. 02/27/23   Setzer, Lynnell Jude, PA-C  anastrozole (ARIMIDEX) 1 MG tablet Take 1 tablet (1 mg total) by mouth daily. 06/11/23   [name], MD  cyanocobalamin (VITAMIN B12) 1000 MCG tablet Take 2 tablets (2,000 mcg total) by mouth daily. 02/28/23   Setzer, Lynnell Jude, PA-C  diclofenac Sodium (VOLTAREN) 1 % GEL Apply 2 g topically 4 (four) times daily as needed. To top of right foot 02/27/23   Setzer, Lynnell Jude, PA-C  DILANTIN 100 MG ER capsule Take 1 capsule (100 mg total) by mouth 2 (two) times daily. 04/16/23   Van Clines, MD  DILANTIN 30 MG ER capsule TAKE 1 CAPSULE BY MOUTH EVERY  NIGHT ALONG WITH 100 MG CAPSULE  FOR A TOTAL EVERY NIGHT DOSE OF  130 MG 04/16/23   Van Clines, MD  famotidine (PEPCID) 20 MG tablet 1 tablet Orally twice a day for 90 days 03/03/23   [provider]  folic acid  (FOLVITE) 1 MG tablet Take 1 tablet (1 mg total) by mouth daily. 02/27/23   Setzer, Lynnell Jude, PA-C  LORazepam (ATIVAN) 1 MG tablet TAKE 1 TABLET BY MOUTH AS DIRECTED AS NEEDED FOR 3 OR MORE SEIZURES IN 24 HOURS. DO NOT TAKE MORE THAN 2 TABLETS IN 24 HOURS. Strength: 1 mg 04/16/23   Van Clines, MD  potassium chloride SA (KLOR-CON M) 20 MEQ tablet Take 1 tablet (20 mEq total) by mouth every other day. 02/27/23   Setzer, Lynnell Jude, PA-C  VIMPAT 200 MG TABS tablet Take 1 tablet (200 mg total) by mouth 2 (two) times daily. 04/16/23   Van Clines, MD  warfarin (COUMADIN) 6 MG tablet Take 1 tablet (6 mg total) by mouth daily at 4 PM. 04/07/23   Carlos Levering, NP      Allergies    Zonegran [zonisamide], Chocolate, Codeine, Keppra [levetiracetam], Lyrica [pregabalin], and Olive oil    Review of Systems   Review of Systems per HPI above   Physical Exam Updated Vital Signs There were no vitals taken for this visit. Physical Exam Constitutional:      General: She is in acute distress.     Comments: Moaning in pain  HENT:     Head: Normocephalic and atraumatic.     Nose: Nose normal.  Mouth/Throat:     Mouth: Mucous membranes are moist.  Eyes:     Extraocular Movements: Extraocular movements intact.     Pupils: Pupils are equal, round, and reactive to light.  Cardiovascular:     Rate and Rhythm: Normal rate and regular rhythm.     Pulses: Normal pulses.     Heart sounds: Normal heart sounds.  Pulmonary:     Effort: Pulmonary effort is normal.     Breath sounds: Normal breath sounds.  Abdominal:     General: There is no distension.     Palpations: Abdomen is soft.     Tenderness: There is no abdominal tenderness. There is no guarding.  Musculoskeletal:     Cervical back: Neck supple. No tenderness.     Comments: Deformity to left distal forearm.  Intact radial pulse sensory and motor function.  Shoulder is tender to palpation on the left side.  Remainder of extremities are  atraumatic.  Spine is nontender.  No pelvic tenderness or instability.  Skin:    General: Skin is warm and dry.  Neurological:     General: No focal deficit present.     Mental Status: She is alert.     Cranial Nerves: No cranial nerve deficit.     Sensory: No sensory deficit.     Motor: No weakness.     ED Results / Procedures / Treatments   Labs (all labs ordered are listed, but only abnormal results are displayed) Labs Reviewed - No data to display  EKG None  Radiology No results found.  Procedures Procedures  {Document cardiac monitor, telemetry assessment procedure when appropriate:1}  Medications Ordered in ED Medications - No data to display  ED Course/ Medical Decision Making/ A&P   {   Click here for ABCD2, HEART and other calculatorsREFRESH Note before signing :1}                              Medical Decision Making Amount and/or Complexity of Data Reviewed Radiology: ordered.  Risk Prescription drug management.   67 year old woman presents as an unlevel trauma for fall on blood thinners.  Differential considered includes skull fracture or ICH, C-spine fracture dislocation, blunt chest abdominal pelvic organ injury, thoracic or lumbar fracture, extremity fracture dislocation or neurovascular injury.  Her physical exam is most concerning for a fracture to the left distal forearm or wrist. No signs of neurovascular compromise. Concerned for head injury given unclear if she hit her head. She complained of neck pain so C collar placed and will obtain C spine imaging too. Plan for CT head, C spine, and XR of L shoulder and wrist/forearm/elbow. Exam without evidence of other injury to chest abdomen pelvis spine or other extremities; other imaging not warranted.   CT head without contrast ***  CT C spine ***   XR wrist shows ***  XR hand shows ***  XR forearm shows ***  XR shoulder shows ***   Discussed patient with hand surgeon on call Dr. Melvyn Novas. ***    {Document critical care time when appropriate:1} {Document review of labs and clinical decision tools ie heart score, Chads2Vasc2 etc:1}  {Document your independent review of radiology images, and any outside records:1} {Document your discussion with family members, caretakers, and with consultants:1} {Document social determinants of health affecting pt's care:1} {Document your decision making why or why not admission, treatments were needed:1} Final Clinical Impression(s) / ED Diagnoses Final diagnoses:  None    Rx / DC Orders ED Discharge Orders     None

## 2023-06-15 ENCOUNTER — Other Ambulatory Visit: Payer: Self-pay

## 2023-06-15 ENCOUNTER — Inpatient Hospital Stay (HOSPITAL_COMMUNITY): Payer: Medicare HMO

## 2023-06-15 ENCOUNTER — Telehealth: Payer: Self-pay | Admitting: Hematology and Oncology

## 2023-06-15 ENCOUNTER — Emergency Department (HOSPITAL_COMMUNITY): Payer: Medicare HMO

## 2023-06-15 ENCOUNTER — Encounter (HOSPITAL_COMMUNITY): Payer: Self-pay | Admitting: Internal Medicine

## 2023-06-15 DIAGNOSIS — E785 Hyperlipidemia, unspecified: Secondary | ICD-10-CM | POA: Diagnosis not present

## 2023-06-15 DIAGNOSIS — S52302A Unspecified fracture of shaft of left radius, initial encounter for closed fracture: Secondary | ICD-10-CM | POA: Diagnosis not present

## 2023-06-15 DIAGNOSIS — Y92009 Unspecified place in unspecified non-institutional (private) residence as the place of occurrence of the external cause: Secondary | ICD-10-CM | POA: Diagnosis not present

## 2023-06-15 DIAGNOSIS — S52502A Unspecified fracture of the lower end of left radius, initial encounter for closed fracture: Secondary | ICD-10-CM | POA: Diagnosis not present

## 2023-06-15 DIAGNOSIS — I619 Nontraumatic intracerebral hemorrhage, unspecified: Secondary | ICD-10-CM | POA: Insufficient documentation

## 2023-06-15 DIAGNOSIS — S5292XA Unspecified fracture of left forearm, initial encounter for closed fracture: Secondary | ICD-10-CM | POA: Diagnosis not present

## 2023-06-15 DIAGNOSIS — S62309A Unspecified fracture of unspecified metacarpal bone, initial encounter for closed fracture: Secondary | ICD-10-CM | POA: Diagnosis not present

## 2023-06-15 DIAGNOSIS — E876 Hypokalemia: Secondary | ICD-10-CM | POA: Diagnosis not present

## 2023-06-15 DIAGNOSIS — I480 Paroxysmal atrial fibrillation: Secondary | ICD-10-CM

## 2023-06-15 DIAGNOSIS — D61818 Other pancytopenia: Secondary | ICD-10-CM | POA: Diagnosis not present

## 2023-06-15 DIAGNOSIS — S52102A Unspecified fracture of upper end of left radius, initial encounter for closed fracture: Secondary | ICD-10-CM

## 2023-06-15 DIAGNOSIS — G40909 Epilepsy, unspecified, not intractable, without status epilepticus: Secondary | ICD-10-CM | POA: Diagnosis not present

## 2023-06-15 DIAGNOSIS — S52692A Other fracture of lower end of left ulna, initial encounter for closed fracture: Secondary | ICD-10-CM | POA: Diagnosis not present

## 2023-06-15 DIAGNOSIS — Z8679 Personal history of other diseases of the circulatory system: Secondary | ICD-10-CM | POA: Diagnosis not present

## 2023-06-15 DIAGNOSIS — D696 Thrombocytopenia, unspecified: Secondary | ICD-10-CM | POA: Diagnosis not present

## 2023-06-15 DIAGNOSIS — G8929 Other chronic pain: Secondary | ICD-10-CM | POA: Diagnosis not present

## 2023-06-15 DIAGNOSIS — S3991XA Unspecified injury of abdomen, initial encounter: Secondary | ICD-10-CM | POA: Diagnosis not present

## 2023-06-15 DIAGNOSIS — K5900 Constipation, unspecified: Secondary | ICD-10-CM | POA: Diagnosis not present

## 2023-06-15 DIAGNOSIS — M79662 Pain in left lower leg: Secondary | ICD-10-CM | POA: Diagnosis not present

## 2023-06-15 DIAGNOSIS — S62393A Other fracture of third metacarpal bone, left hand, initial encounter for closed fracture: Secondary | ICD-10-CM | POA: Diagnosis not present

## 2023-06-15 DIAGNOSIS — S52209A Unspecified fracture of shaft of unspecified ulna, initial encounter for closed fracture: Secondary | ICD-10-CM

## 2023-06-15 DIAGNOSIS — Z923 Personal history of irradiation: Secondary | ICD-10-CM | POA: Diagnosis not present

## 2023-06-15 DIAGNOSIS — K5909 Other constipation: Secondary | ICD-10-CM | POA: Diagnosis not present

## 2023-06-15 DIAGNOSIS — S299XXA Unspecified injury of thorax, initial encounter: Secondary | ICD-10-CM | POA: Diagnosis not present

## 2023-06-15 DIAGNOSIS — M792 Neuralgia and neuritis, unspecified: Secondary | ICD-10-CM | POA: Diagnosis not present

## 2023-06-15 DIAGNOSIS — S3993XA Unspecified injury of pelvis, initial encounter: Secondary | ICD-10-CM | POA: Diagnosis not present

## 2023-06-15 DIAGNOSIS — S62102A Fracture of unspecified carpal bone, left wrist, initial encounter for closed fracture: Secondary | ICD-10-CM | POA: Diagnosis not present

## 2023-06-15 DIAGNOSIS — M79609 Pain in unspecified limb: Secondary | ICD-10-CM | POA: Diagnosis not present

## 2023-06-15 DIAGNOSIS — U071 COVID-19: Secondary | ICD-10-CM | POA: Diagnosis not present

## 2023-06-15 DIAGNOSIS — S52001A Unspecified fracture of upper end of right ulna, initial encounter for closed fracture: Secondary | ICD-10-CM

## 2023-06-15 DIAGNOSIS — I82A21 Chronic embolism and thrombosis of right axillary vein: Secondary | ICD-10-CM | POA: Insufficient documentation

## 2023-06-15 DIAGNOSIS — Z87891 Personal history of nicotine dependence: Secondary | ICD-10-CM | POA: Diagnosis not present

## 2023-06-15 DIAGNOSIS — M47816 Spondylosis without myelopathy or radiculopathy, lumbar region: Secondary | ICD-10-CM | POA: Diagnosis not present

## 2023-06-15 DIAGNOSIS — W19XXXA Unspecified fall, initial encounter: Secondary | ICD-10-CM

## 2023-06-15 DIAGNOSIS — J410 Simple chronic bronchitis: Secondary | ICD-10-CM

## 2023-06-15 DIAGNOSIS — W010XXA Fall on same level from slipping, tripping and stumbling without subsequent striking against object, initial encounter: Secondary | ICD-10-CM | POA: Diagnosis not present

## 2023-06-15 DIAGNOSIS — Z043 Encounter for examination and observation following other accident: Secondary | ICD-10-CM | POA: Diagnosis not present

## 2023-06-15 DIAGNOSIS — K219 Gastro-esophageal reflux disease without esophagitis: Secondary | ICD-10-CM | POA: Diagnosis not present

## 2023-06-15 DIAGNOSIS — J439 Emphysema, unspecified: Secondary | ICD-10-CM | POA: Diagnosis not present

## 2023-06-15 DIAGNOSIS — Z8616 Personal history of COVID-19: Secondary | ICD-10-CM | POA: Diagnosis not present

## 2023-06-15 DIAGNOSIS — Z79899 Other long term (current) drug therapy: Secondary | ICD-10-CM | POA: Diagnosis not present

## 2023-06-15 DIAGNOSIS — D539 Nutritional anemia, unspecified: Secondary | ICD-10-CM | POA: Insufficient documentation

## 2023-06-15 DIAGNOSIS — R29898 Other symptoms and signs involving the musculoskeletal system: Secondary | ICD-10-CM | POA: Diagnosis not present

## 2023-06-15 DIAGNOSIS — Z79811 Long term (current) use of aromatase inhibitors: Secondary | ICD-10-CM | POA: Diagnosis not present

## 2023-06-15 DIAGNOSIS — C50912 Malignant neoplasm of unspecified site of left female breast: Secondary | ICD-10-CM

## 2023-06-15 DIAGNOSIS — J449 Chronic obstructive pulmonary disease, unspecified: Secondary | ICD-10-CM | POA: Diagnosis not present

## 2023-06-15 DIAGNOSIS — S62323A Displaced fracture of shaft of third metacarpal bone, left hand, initial encounter for closed fracture: Secondary | ICD-10-CM | POA: Diagnosis not present

## 2023-06-15 DIAGNOSIS — S0990XA Unspecified injury of head, initial encounter: Secondary | ICD-10-CM | POA: Diagnosis not present

## 2023-06-15 DIAGNOSIS — F411 Generalized anxiety disorder: Secondary | ICD-10-CM | POA: Diagnosis not present

## 2023-06-15 DIAGNOSIS — C50919 Malignant neoplasm of unspecified site of unspecified female breast: Secondary | ICD-10-CM

## 2023-06-15 DIAGNOSIS — Z95828 Presence of other vascular implants and grafts: Secondary | ICD-10-CM | POA: Diagnosis not present

## 2023-06-15 DIAGNOSIS — Z853 Personal history of malignant neoplasm of breast: Secondary | ICD-10-CM | POA: Diagnosis not present

## 2023-06-15 DIAGNOSIS — Z86718 Personal history of other venous thrombosis and embolism: Secondary | ICD-10-CM

## 2023-06-15 DIAGNOSIS — I10 Essential (primary) hypertension: Secondary | ICD-10-CM | POA: Diagnosis not present

## 2023-06-15 DIAGNOSIS — S52502D Unspecified fracture of the lower end of left radius, subsequent encounter for closed fracture with routine healing: Secondary | ICD-10-CM | POA: Diagnosis not present

## 2023-06-15 DIAGNOSIS — S52602A Unspecified fracture of lower end of left ulna, initial encounter for closed fracture: Secondary | ICD-10-CM | POA: Diagnosis not present

## 2023-06-15 DIAGNOSIS — M160 Bilateral primary osteoarthritis of hip: Secondary | ICD-10-CM | POA: Diagnosis not present

## 2023-06-15 DIAGNOSIS — G8918 Other acute postprocedural pain: Secondary | ICD-10-CM | POA: Diagnosis not present

## 2023-06-15 LAB — CBC
HCT: 36.5 % (ref 36.0–46.0)
Hemoglobin: 11.9 g/dL — ABNORMAL LOW (ref 12.0–15.0)
MCH: 33.5 pg (ref 26.0–34.0)
MCHC: 32.6 g/dL (ref 30.0–36.0)
MCV: 102.8 fL — ABNORMAL HIGH (ref 80.0–100.0)
Platelets: 83 10*3/uL — ABNORMAL LOW (ref 150–400)
RBC: 3.55 MIL/uL — ABNORMAL LOW (ref 3.87–5.11)
RDW: 12.7 % (ref 11.5–15.5)
WBC: 5 10*3/uL (ref 4.0–10.5)
nRBC: 0 % (ref 0.0–0.2)

## 2023-06-15 LAB — PROTIME-INR
INR: 2.5 — ABNORMAL HIGH (ref 0.8–1.2)
INR: 1.8 — ABNORMAL HIGH (ref 0.8–1.2)
Prothrombin Time: 26.9 s — ABNORMAL HIGH (ref 11.4–15.2)
Prothrombin Time: 21.1 s — ABNORMAL HIGH (ref 11.4–15.2)

## 2023-06-15 LAB — COMPREHENSIVE METABOLIC PANEL
ALT: 12 U/L (ref 0–44)
AST: 19 U/L (ref 15–41)
Albumin: 3.1 g/dL — ABNORMAL LOW (ref 3.5–5.0)
Alkaline Phosphatase: 46 U/L (ref 38–126)
Anion gap: 8 (ref 5–15)
BUN: 11 mg/dL (ref 8–23)
CO2: 22 mmol/L (ref 22–32)
Calcium: 8.2 mg/dL — ABNORMAL LOW (ref 8.9–10.3)
Chloride: 109 mmol/L (ref 98–111)
Creatinine, Ser: 0.66 mg/dL (ref 0.44–1.00)
GFR, Estimated: >60 mL/min (ref >60)
Glucose, Bld: 124 mg/dL — ABNORMAL HIGH (ref 70–99)
Potassium: 3.9 mmol/L (ref 3.5–5.1)
Sodium: 139 mmol/L (ref 135–145)
Total Bilirubin: 0.3 mg/dL (ref <1.2)
Total Protein: 5.5 g/dL — ABNORMAL LOW (ref 6.5–8.1)

## 2023-06-15 LAB — PHENYTOIN LEVEL, TOTAL: Phenytoin Lvl: 9.3 ug/mL — ABNORMAL LOW (ref 10.0–20.0)

## 2023-06-15 LAB — HEPARIN LEVEL (UNFRACTIONATED): Heparin Unfractionated: 0.22 [IU]/mL — ABNORMAL LOW (ref 0.30–0.70)

## 2023-06-15 MED ORDER — ACETAMINOPHEN 325 MG PO TABS
650.0000 mg | ORAL_TABLET | Freq: Four times a day (QID) | ORAL | Status: DC | PRN
Start: 2023-06-15 — End: 2023-06-22
  Administered 2023-06-15 – 2023-06-18 (×4): 650 mg via ORAL
  Filled 2023-06-15 (×5): qty 2

## 2023-06-15 MED ORDER — FENTANYL CITRATE PF 50 MCG/ML IJ SOSY
25.0000 ug | PREFILLED_SYRINGE | INTRAMUSCULAR | Status: DC | PRN
  Administered 2023-06-15 (×5): 25 ug via INTRAVENOUS
  Filled 2023-06-15 (×5): qty 1

## 2023-06-15 MED ORDER — FENTANYL CITRATE PF 50 MCG/ML IJ SOSY: 12.5000 ug | PREFILLED_SYRINGE | INTRAMUSCULAR | Status: DC | PRN

## 2023-06-15 MED ORDER — HEPARIN (PORCINE) 25000 UT/250ML-% IV SOLN
950.0000 [IU]/h | INTRAVENOUS | Status: AC
  Administered 2023-06-15: 800 [IU]/h via INTRAVENOUS
  Administered 2023-06-16: 1100 [IU]/h via INTRAVENOUS
  Filled 2023-06-15 (×2): qty 250

## 2023-06-15 MED ORDER — KETAMINE HCL 10 MG/ML IJ SOLN
INTRAMUSCULAR | Status: AC | PRN
  Administered 2023-06-15: 21 mg via INTRAVENOUS

## 2023-06-15 MED ORDER — LACOSAMIDE 200 MG PO TABS
200.0000 mg | ORAL_TABLET | Freq: Two times a day (BID) | ORAL | Status: DC
  Administered 2023-06-15 – 2023-06-23 (×17): 200 mg via ORAL
  Filled 2023-06-15: qty 1
  Filled 2023-06-15: qty 4
  Filled 2023-06-15 (×15): qty 1

## 2023-06-15 MED ORDER — SODIUM CHLORIDE 0.9% FLUSH
3.0000 mL | Freq: Two times a day (BID) | INTRAVENOUS | Status: DC
  Administered 2023-06-15 – 2023-06-23 (×14): 3 mL via INTRAVENOUS

## 2023-06-15 MED ORDER — FAMOTIDINE 20 MG PO TABS
40.0000 mg | ORAL_TABLET | Freq: Every day | ORAL | Status: DC
  Administered 2023-06-15 – 2023-06-23 (×8): 40 mg via ORAL
  Filled 2023-06-15 (×8): qty 2

## 2023-06-15 MED ORDER — KETAMINE HCL 50 MG/5ML IJ SOSY
0.5000 mg/kg | PREFILLED_SYRINGE | Freq: Once | INTRAMUSCULAR | Status: AC
  Administered 2023-06-15: 29 mg via INTRAVENOUS
  Filled 2023-06-15: qty 5

## 2023-06-15 MED ORDER — VITAMIN B-12 1000 MCG PO TABS
2000.0000 ug | ORAL_TABLET | Freq: Every day | ORAL | Status: DC
  Administered 2023-06-15 – 2023-06-23 (×8): 2000 ug via ORAL
  Filled 2023-06-15 (×8): qty 2

## 2023-06-15 MED ORDER — LEVALBUTEROL HCL 0.63 MG/3ML IN NEBU: 0.6300 mg | INHALATION_SOLUTION | Freq: Four times a day (QID) | RESPIRATORY_TRACT | Status: DC | PRN

## 2023-06-15 MED ORDER — ACETAMINOPHEN 650 MG RE SUPP
650.0000 mg | Freq: Four times a day (QID) | RECTAL | Status: DC | PRN
Start: 2023-06-15 — End: 2023-06-22

## 2023-06-15 MED ORDER — MIDAZOLAM HCL 2 MG/2ML IJ SOLN
INTRAMUSCULAR | Status: AC
  Filled 2023-06-15: qty 2

## 2023-06-15 MED ORDER — VITAMIN K1 10 MG/ML IJ SOLN
2.5000 mg | Freq: Once | INTRAVENOUS | Status: AC
  Administered 2023-06-15: 2.5 mg via INTRAVENOUS
  Filled 2023-06-15: qty 0.25

## 2023-06-15 MED ORDER — ONDANSETRON HCL 4 MG PO TABS: 4.0000 mg | ORAL_TABLET | Freq: Four times a day (QID) | ORAL | Status: DC | PRN

## 2023-06-15 MED ORDER — SENNOSIDES-DOCUSATE SODIUM 8.6-50 MG PO TABS
1.0000 | ORAL_TABLET | Freq: Every evening | ORAL | Status: DC | PRN
  Administered 2023-06-19: 1 via ORAL

## 2023-06-15 MED ORDER — OXYCODONE-ACETAMINOPHEN 5-325 MG PO TABS
1.0000 | ORAL_TABLET | Freq: Four times a day (QID) | ORAL | Status: DC | PRN
  Administered 2023-06-15 – 2023-06-18 (×9): 1 via ORAL
  Filled 2023-06-15 (×9): qty 1

## 2023-06-15 MED ORDER — IOHEXOL 350 MG/ML SOLN
75.0000 mL | Freq: Once | INTRAVENOUS | Status: AC | PRN
  Administered 2023-06-15: 75 mL via INTRAVENOUS

## 2023-06-15 MED ORDER — SODIUM CHLORIDE 0.9% FLUSH: 3.0000 mL | Freq: Two times a day (BID) | INTRAVENOUS | Status: DC

## 2023-06-15 MED ORDER — SODIUM CHLORIDE 0.9 % IV SOLN: 250.0000 mL | INTRAVENOUS | Status: AC | PRN

## 2023-06-15 MED ORDER — MUPIROCIN 2 % EX OINT
1.0000 | TOPICAL_OINTMENT | Freq: Two times a day (BID) | CUTANEOUS | Status: DC
  Administered 2023-06-15 – 2023-06-16 (×3): 1 via NASAL
  Filled 2023-06-15: qty 22

## 2023-06-15 MED ORDER — MIDAZOLAM HCL 5 MG/5ML IJ SOLN
INTRAMUSCULAR | Status: AC | PRN
  Administered 2023-06-15: 2 mg via INTRAVENOUS

## 2023-06-15 MED ORDER — ANASTROZOLE 1 MG PO TABS
1.0000 mg | ORAL_TABLET | Freq: Every day | ORAL | Status: DC
Start: 2023-06-15 — End: 2023-06-23
  Administered 2023-06-15 – 2023-06-23 (×9): 1 mg via ORAL
  Filled 2023-06-15 (×9): qty 1

## 2023-06-15 MED ORDER — SODIUM CHLORIDE 0.9% FLUSH: 3.0000 mL | INTRAVENOUS | Status: DC | PRN

## 2023-06-15 MED ORDER — FOLIC ACID 1 MG PO TABS
1.0000 mg | ORAL_TABLET | Freq: Every day | ORAL | Status: DC
  Administered 2023-06-15 – 2023-06-23 (×8): 1 mg via ORAL
  Filled 2023-06-15 (×8): qty 1

## 2023-06-15 MED ORDER — PROPOFOL 10 MG/ML IV BOLUS
INTRAVENOUS | Status: AC | PRN
  Administered 2023-06-15: 100 mg via INTRAVENOUS
  Administered 2023-06-15: 50 mg via INTRAVENOUS
  Administered 2023-06-15: 42 mg via INTRAVENOUS

## 2023-06-15 MED ORDER — PHENYTOIN SODIUM EXTENDED 30 MG PO CAPS
130.0000 mg | ORAL_CAPSULE | Freq: Every day | ORAL | Status: DC
  Administered 2023-06-15 – 2023-06-22 (×8): 130 mg via ORAL
  Filled 2023-06-15 (×9): qty 1

## 2023-06-15 MED ORDER — VITAMIN K1 10 MG/ML IJ SOLN: 2.5000 mg | Freq: Once | INTRAMUSCULAR | Status: DC

## 2023-06-15 MED ORDER — FENTANYL CITRATE PF 50 MCG/ML IJ SOSY
50.0000 ug | PREFILLED_SYRINGE | Freq: Once | INTRAMUSCULAR | Status: AC
  Administered 2023-06-15: 50 ug via INTRAVENOUS
  Filled 2023-06-15: qty 1

## 2023-06-15 MED ORDER — PHENYTOIN SODIUM EXTENDED 100 MG PO CAPS
100.0000 mg | ORAL_CAPSULE | Freq: Every morning | ORAL | Status: DC
  Administered 2023-06-15 – 2023-06-23 (×9): 100 mg via ORAL
  Filled 2023-06-15 (×10): qty 1

## 2023-06-15 MED ORDER — DEXTROSE IN LACTATED RINGERS 5 % IV SOLN: INTRAVENOUS | Status: AC

## 2023-06-15 MED ORDER — PROPOFOL 10 MG/ML IV BOLUS
1.0000 mg/kg | Freq: Once | INTRAVENOUS | Status: AC
  Administered 2023-06-15: 58 mg via INTRAVENOUS
  Filled 2023-06-15: qty 20

## 2023-06-15 MED ORDER — MIDAZOLAM HCL (PF) 10 MG/2ML IJ SOLN
INTRAMUSCULAR | Status: AC
  Filled 2023-06-15: qty 2

## 2023-06-15 MED ORDER — POTASSIUM CHLORIDE 20 MEQ PO PACK
40.0000 meq | PACK | Freq: Once | ORAL | Status: AC
  Administered 2023-06-15: 40 meq via ORAL
  Filled 2023-06-15: qty 2

## 2023-06-15 MED ORDER — ONDANSETRON HCL 4 MG/2ML IJ SOLN
4.0000 mg | Freq: Four times a day (QID) | INTRAMUSCULAR | Status: DC | PRN
  Administered 2023-06-15 – 2023-06-19 (×4): 4 mg via INTRAVENOUS
  Filled 2023-06-15 (×4): qty 2

## 2023-06-15 NOTE — ED Notes (Signed)
A/C IV access infiltrated with normal saline, propofol, and ketamine. Blanching and redness noted at side. IV catheter removed, arm elevated and warm pack applied.

## 2023-06-15 NOTE — ED Provider Notes (Signed)
Assumed care from Dr. Nonnie Done and Dr. Lynelle Doctor.  Patient presents after mechanical fall injuring her left forearm and wrist.  Did not hit her head or lose consciousness.  Does take Coumadin for history of DVT.  Also has history of breast cancer, atrial fibrillation, thrombocytopenia, COPD and seizure disorder.  Patient with left forearm fracture and dislocation as below.  Discussed with hand surgery she is known to Dr. Orlan Leavens. Per Dr. Victorino Dike who is covering for Dr. Orlan Leavens recommends attempt at reduction in the ED and admission for further evaluation in the morning.  CT head and C-spine negative for acute traumatic injury. Questionable scapular fracture seen on left shoulder x-ray. CT chest, abdomen pelvis, negative for acute traumatic injury  Patient found to have a displaced periprosthetic distal radius fracture, distal ulnar dislocation, third metacarpal midshaft fracture. Already having some skin break down at distal end of ulna dislocation but no open fracture.   Successful reduction of ulnar head dislocation which was causing skin necrosis but there is no open fracture or open dislocation.  Distal radius fracture remains displaced just past hardware.  Per discussion with Dr. Victorino Dike will admit for Dr. Orlan Leavens to evaluate in the morning.  Discussed with Dr. Janalyn Shy.   .Sedation  Date/Time: 06/15/2023 1:22 AM  Performed by: Glynn Octave, MD Authorized by: Glynn Octave, MD   Consent:    Consent obtained:  Written   Consent given by:  Patient   Risks discussed:  Allergic reaction, dysrhythmia, nausea, vomiting, respiratory compromise necessitating ventilatory assistance and intubation, prolonged sedation necessitating reversal, prolonged hypoxia resulting in organ damage and inadequate sedation   Alternatives discussed:  Analgesia without sedation Universal protocol:    Procedure explained and questions answered to patient or proxy's satisfaction: yes     Relevant documents present and  verified: yes     Test results available: yes     Imaging studies available: yes     Required blood products, implants, devices, and special equipment available: yes     Site/side marked: yes     Immediately prior to procedure, a time out was called: yes     Patient identity confirmed:  Arm band Indications:    Procedure performed:  Fracture reduction   Procedure necessitating sedation performed by:  Different physician Pre-sedation assessment:    Time since last food or drink:  6   NPO status caution: unable to specify NPO status     ASA classification: class 3 - patient with severe systemic disease     Mouth opening:  3 or more finger widths   Thyromental distance:  4 finger widths   Mallampati score:  I - soft palate, uvula, fauces, pillars visible   Neck mobility: normal     Pre-sedation assessments completed and reviewed: airway patency, cardiovascular function, hydration status, mental status, nausea/vomiting, pain level, respiratory function and temperature     Pre-sedation assessment completed:  06/15/2023 12:47 PM Immediate pre-procedure details:    Reassessment: Patient reassessed immediately prior to procedure     Reviewed: vital signs     Verified: bag valve mask available, emergency equipment available, intubation equipment available, IV patency confirmed, oxygen available, reversal medications available and suction available   Procedure details (see MAR for exact dosages):    Preoxygenation:  Nasal cannula   Sedation:  Ketamine and propofol   Intended level of sedation: deep   Analgesia:  Hydromorphone   Intra-procedure monitoring:  Continuous pulse oximetry, continuous capnometry, frequent LOC assessments, frequent vital sign checks, blood pressure  monitoring and cardiac monitor   Intra-procedure events: none     Total Provider sedation time (minutes):  40 Post-procedure details:    Post-sedation assessment completed:  06/15/2023 1:23 AM   Attendance: Constant  attendance by certified staff until patient recovered     Recovery: Patient returned to pre-procedure baseline     Post-sedation assessments completed and reviewed: airway patency, cardiovascular function, hydration status, mental status, nausea/vomiting, pain level, respiratory function and temperature     Procedure completion:  Tolerated well, no immediate complications Comments:     Procedure complicated by some infiltration of propofol to right AC.     Glynn Octave, MD 06/15/23 1610    Glynn Octave, MD 06/16/23 0010

## 2023-06-15 NOTE — Progress Notes (Signed)
Orthopedic Tech Progress Note Patient Details:  [name] 07/26/1956 [id number]  Ortho Devices Type of Ortho Device: Arm sling, Sugartong splint Ortho Device/Splint Location: lue Ortho Device/Splint Interventions: Ordered, Adjustment, Application  I applied a long sugartong splint post reduction at drs request. Post Interventions Patient Tolerated: Well Instructions Provided: Care of device, Adjustment of device  Trinna Post 06/15/2023, 1:45 AM

## 2023-06-15 NOTE — Plan of Care (Signed)
  Problem: Clinical Measurements: Goal: Respiratory complications will improve Outcome: Progressing   Problem: Nutrition: Goal: Adequate nutrition will be maintained Outcome: Progressing   

## 2023-06-15 NOTE — H&P (Addendum)
History and Physical    [name] ZOX:[id number] DOB: [birth year] DOA: 06/14/2023  PCP: [name], FNP   Patient coming from: Home   Chief Complaint:  Chief Complaint  Patient presents with   Fall   ED TRIAGE note:Pt is coming from home, pt states she was walking he foot slipped and caused her fall, deformed fracture to left wrist, she is on a blood thinner. Medic gave her of fentanyl and splinted the wrist.   HPI:  INDRA HINSON is a 67 y.o. 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  Patient reported that patient slipped and had a fall.  Landed on the left upper extremity.  Immediately show increased pain and swelling of the left wrist and left upper shoulder.  Called EMS.  EMS noted deformity of the left upper extremity and placed a temporary splint.  Patient got fentanyl 200 mg.  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 Melvyn Novas. During my evaluation at bedside patient reported that when she slipped the stairs and had a fall even though she landed on left upper lower extremity of also she hit the backside of her head with the wall but not very significantly.  Patient denies any headache, nausea, blurry vision and vomiting.  Currently complaining about continuous left-sided hip pain that goes down up to the knee joint.  Complaining about pain with movement of the left hip joint.  Left-sided forearm pain 6 out of 10  intensity now. Patient denies any other injury at this time.  Denies any chest pain, shortness of breath, cough, palpitation, cough, fever and chill.  Denies any abdominal pain, nausea and vomiting.   ED Course:  At presentation to ED heart rate 90, respiratory rate 15, blood pressure 148/87 and O2 sat 95%. Elevated pro time 2.5 and INR 26. BMP unremarkable except hypokalemia 3.3. CBC unremarkable with stable H&H 11.9 and 37.  Low platelet 113.  X-ray left hand and wrist: MPRESSION: 1. Acute displaced periprosthetic distal radial fracture along the proximal aspect of a prior plate and screw fixation. 2. Distal volar ulnar dislocation. 3. Acute displaced diagonal fracture of the third digit metacarpal body with possible intra-articular extension to the base.  X-ray left forearm: 1. Unchanged comminuted displaced fracture in the distal left radial diaphysis about the plate and screw fixation. 2. New or more conspicuous minimally displaced oblique fracture through the distal ulnar metaphysis and ulnar styloid. Successful reduction of the distal ulna dislocation. 3. Unchanged oblique fracture through the mid third metacarpal diaphysis.  X-ray hip no dislocation and fracture. CT abdomen pelvis and chest: IMPRESSION: 1. No evidence of solid organ injury or acute fracture. 2. Mild dilatation of the common bile duct measuring 10 mm. Consider nonemergent MRCP for further evaluation. 3. Emphysema. 4. Aortic atherosclerosis  CT scan of the wrist: IMPRESSION: 1. Transverse fracture at the proximal aspect of the distal radial plate with radial and dorsal displacement of the distal fragment 1/2 shaft width. 2. Minimally displaced fracture of the distal ulnar metaphysis and styloid. 3. Oblique fracture through the mid third metacarpal diaphysis.  ED physician Dr. Manus Gunning poke with on-call orthopedics Dr. Victorino Dike who stated that this patient follows Dr. Orlan Leavens and he is on-call tomorrow  and he will update Dr. Orlan Leavens to see the patient in the a.m.   Hospitalist has been contacted for further evaluation of fracture and chronic medical problems.  Review of Systems:  Review of Systems  Constitutional:  Positive for malaise/fatigue. Negative for chills, fever and weight loss.  Eyes:  Negative for blurred vision.  Respiratory:  Negative for cough and shortness of breath.   Cardiovascular:  Negative for chest pain, palpitations, claudication and leg swelling.  Gastrointestinal:  Negative for abdominal pain.  Musculoskeletal:  Positive for falls and joint pain. Negative for back pain, myalgias and neck pain.       Left-sided forearm and wrist pain.  Left-sided lower extremity pain up to 9  Neurological:  Negative for dizziness, tremors, focal weakness, seizures, loss of consciousness, weakness and headaches.  Endo/Heme/Allergies:  Does not bruise/bleed easily.  Psychiatric/Behavioral:  The patient is not nervous/anxious.     Past Medical History:  Diagnosis Date   Acute bronchitis 08/06/2022   Acute respiratory failure with hypoxia (HCC) 03/31/2015   Altered mental status    Asthma    Ataxia 08/26/2019   Atypical chest pain 02/17/2017   Cerebral hemorrhage (HCC) 1989   Chronic back pain    Closed fracture of distal end of left radius 07/17/2022   Closed fracture of fifth metatarsal bone 01/04/2019   Closed fracture of proximal phalanx of great toe 01/04/2019   Closed nondisplaced fracture of styloid process of left radius 08/06/2022   COPD (chronic obstructive pulmonary disease) (HCC)    no meds per daughter   COPD with exacerbation (HCC) 03/30/2015   Costochondritis 02/17/2017   COVID-19 virus infection 04/02/2020   DDD (degenerative disc disease) 06/12/2013   DVT (deep venous thrombosis) (HCC)    "she's had several since 1990"   GERD (gastroesophageal reflux disease)    no meds per daughter   Headache    "usually around time when she's had a seizure"   History  of blood transfusion    "when she was a baby"   History of kidney stones    History of stomach ulcers    Hypoxia 01/18/2015   Lactic acidosis 04/02/2020   Neuropathy    Seizures (HCC)    "because of her brain surgery"  last one 07/28/22   Sepsis secondary to UTI (HCC) 01/17/2015   Stroke [facility]) 1990's   family denies residual on 11/09/2014   Tunnel vision    "since brain OR"    Past Surgical History:  Procedure Laterality Date   BRAIN SURGERY  1989   craniotomy with hematoma evacuation   BREAST BIOPSY Left 12/22/2022   [place] LT BREAST BX W LOC DEV 1ST LESION IMG BX SPEC US GUIDE 12/22/2022 [facility]   BREAST BIOPSY  01/09/2023   MM LT RADIOACTIVE SEED LOC MAMMO GUIDE 01/09/2023 [facility]   BREAST LUMPECTOMY WITH RADIOACTIVE SEED AND SENTINEL LYMPH NODE BIOPSY Left 01/12/2023   Procedure: LEFT BREAST LUMPECTOMY WITH RADIOACTIVE SEED AND SENTINEL LYMPH NODE BIOPSY;  Surgeon: Manus Rudd, MD;  Location: [facility];  Service: General;  Laterality: Left;   CESAREAN SECTION  1979; 1987; 1989   HEMORRHOID SURGERY     IVC FILTER INSERTION N/A 10/12/2020   Procedure: IVC FILTER INSERTION;  Surgeon: Chuck Hint, MD;  Location: [facility];  Service: Cardiovascular;  Laterality: N/A;  OPEN REDUCTION INTERNAL FIXATION (ORIF) DISTAL RADIAL FRACTURE Left 08/25/2022   Procedure: OPEN REDUCTION INTERNAL FIXATION (ORIF) DISTAL RADIUS FRACTURE of nascent malunion;  Surgeon: Bradly Bienenstock, MD;  Location: [facility] OR;  Service: Orthopedics;  Laterality: Left;  regional with iv sedation   TUBAL LIGATION  1990's     reports that she quit smoking about 5 years ago. Her smoking use included cigarettes. She started smoking about 25 years ago. She has a 10 pack-year smoking history. She has never used smokeless tobacco. She reports that she does not drink alcohol and does not use drugs.  Allergies  Allergen Reactions   Zonegran [Zonisamide] Other (See Comments)     Numbness and tingling over whole body   Chocolate Other (See Comments)    Triggers seizures   Codeine Nausea And Vomiting   Keppra [Levetiracetam] Other (See Comments)    Causes seizures   Lyrica [Pregabalin] Nausea And Vomiting   Olive Oil Rash    Family History  Problem Relation Age of Onset   Colon cancer Mother        started as vaginal cancer   Heart attack Mother        4 MIs, started in her 81s, also vaginal cancer and colon cancer   Cancer Mother    Heart attack Father        Died suddenly age 80 - autopsy revealed heart attack her patient   Cancer Brother        Sinus cancer   Cervical cancer Maternal Aunt    Rectal cancer Neg Hx    Stomach cancer Neg Hx    Esophageal cancer Neg Hx     Prior to Admission medications   Medication Sig Start Date End Date Taking? Authorizing Provider  acetaminophen (TYLENOL) 325 MG tablet Take 1-2 tablets (325-650 mg total) by mouth every 4 (four) hours as needed for mild pain. 02/27/23   Setzer, Lynnell Jude, PA-C  anastrozole (ARIMIDEX) 1 MG tablet Take 1 tablet (1 mg total) by mouth daily. 06/11/23   [name], MD  cyanocobalamin (VITAMIN B12) 1000 MCG tablet Take 2 tablets (2,000 mcg total) by mouth daily. 02/28/23   Setzer, Lynnell Jude, PA-C  diclofenac Sodium (VOLTAREN) 1 % GEL Apply 2 g topically 4 (four) times daily as needed. To top of right foot 02/27/23   Setzer, Lynnell Jude, PA-C  DILANTIN 100 MG ER capsule Take 1 capsule (100 mg total) by mouth 2 (two) times daily. 04/16/23   Van Clines, MD  DILANTIN 30 MG ER capsule TAKE 1 CAPSULE BY MOUTH EVERY  NIGHT ALONG WITH 100 MG CAPSULE  FOR A TOTAL EVERY NIGHT DOSE OF  130 MG 04/16/23   Van Clines, MD  famotidine (PEPCID) 20 MG tablet 1 tablet Orally twice a day for 90 days 03/03/23   [provider]  folic acid (FOLVITE) 1 MG tablet Take 1 tablet (1 mg total) by mouth daily. 02/27/23   Setzer, Lynnell Jude, PA-C  LORazepam (ATIVAN) 1 MG tablet TAKE 1 TABLET BY MOUTH AS DIRECTED AS  NEEDED FOR 3 OR MORE SEIZURES IN 24 HOURS. DO NOT TAKE MORE THAN 2 TABLETS IN 24 HOURS. Strength: 1 mg 04/16/23   Van Clines, MD  potassium chloride SA (KLOR-CON M) 20 MEQ tablet Take 1 tablet (20 mEq total) by mouth every other day. 02/27/23   Setzer, Lynnell Jude, PA-C  VIMPAT 200 MG TABS tablet Take 1 tablet (200 mg total) by mouth 2 (two) times daily.  04/16/23   Van Clines, MD  warfarin (COUMADIN) 6 MG tablet Take 1 tablet (6 mg total) by mouth daily at 4 PM. 04/07/23   Carlos Levering, NP     Physical Exam: Vitals:   06/15/23 0315 06/15/23 0355 06/15/23 0430 06/15/23 0436  BP: 123/64 120/61 131/68   Pulse: 96 93 91   Resp: 19 20 14    Temp:    98 F (36.7 C)  TempSrc:    Oral  SpO2: 96% 98% 99%   Weight:      Height:        Physical Exam Constitutional:      General: She is not in acute distress.    Appearance: She is ill-appearing.  HENT:     Head: Normocephalic and atraumatic.     Mouth/Throat:     Mouth: Mucous membranes are dry.  Eyes:     Pupils: Pupils are equal, round, and reactive to light.  Cardiovascular:     Rate and Rhythm: Normal rate. Rhythm irregular.     Pulses: Normal pulses.     Heart sounds: Normal heart sounds.  Pulmonary:     Effort: Pulmonary effort is normal.     Breath sounds: Normal breath sounds.  Abdominal:     General: Bowel sounds are normal.  Musculoskeletal:     Cervical back: Neck supple.     Right lower leg: No edema.     Left lower leg: No edema.     Comments: Left-sided forearm and wrist pain 7 out of 10 intensity.  Left-sided hip pain up to knee joint.  Bilateral upper and lower extremities distal pulse 2+ intact.  Skin:    General: Skin is dry.     Capillary Refill: Capillary refill takes less than 2 seconds.     Coloration: Skin is not pale.     Findings: No bruising or lesion.  Neurological:     Mental Status: She is alert and oriented to person, place, and time.     Motor: Weakness present.  Psychiatric:         Mood and Affect: Mood normal.        Behavior: Behavior normal.        Thought Content: Thought content normal.        Judgment: Judgment normal.      Labs on Admission: I have personally reviewed following labs and imaging studies  CBC: Recent Labs  Lab 06/14/23 2230  WBC 4.8  HGB 11.9*  HCT 37.1  MCV 101.9*  PLT 113*   Basic Metabolic Panel: Recent Labs  Lab 06/14/23 2230  NA 140  K 3.3*  CL 106  CO2 23  GLUCOSE 147*  BUN 15  CREATININE 0.69  CALCIUM 8.9   GFR: Estimated Creatinine Clearance: 64.2 mL/min (by C-G formula based on SCr of 0.69 mg/dL). Liver Function Tests: No results for input(s): "AST", "ALT", "ALKPHOS", "BILITOT", "PROT", "ALBUMIN" in the last 168 hours. No results for input(s): "LIPASE", "AMYLASE" in the last 168 hours. No results for input(s): "AMMONIA" in the last 168 hours. Coagulation Profile: Recent Labs  Lab 06/14/23 2230  INR 2.4*   Cardiac Enzymes: No results for input(s): "CKTOTAL", "CKMB", "CKMBINDEX", "TROPONINI", "TROPONINIHS" in the last 168 hours. BNP (last 3 results) Recent Labs    02/17/23 0358 02/18/23 0539 02/19/23 0612  BNP 220.1* 132.8* 116.0*   HbA1C: No results for input(s): "HGBA1C" in the last 72 hours. CBG: No results for input(s): "GLUCAP" in the last 168 hours.  Lipid Profile: No results for input(s): "CHOL", "HDL", "LDLCALC", "TRIG", "CHOLHDL", "LDLDIRECT" in the last 72 hours. Thyroid Function Tests: No results for input(s): "TSH", "T4TOTAL", "FREET4", "T3FREE", "THYROIDAB" in the last 72 hours. Anemia Panel: No results for input(s): "VITAMINB12", "FOLATE", "FERRITIN", "TIBC", "IRON", "RETICCTPCT" in the last 72 hours. Urine analysis:    Component Value Date/Time   COLORURINE YELLOW 03/25/2023 0827   APPEARANCEUR CLEAR 03/25/2023 0827   LABSPEC 1.017 03/25/2023 0827   PHURINE 7.0 03/25/2023 0827   GLUCOSEU NEGATIVE 03/25/2023 0827   HGBUR NEGATIVE 03/25/2023 0827   BILIRUBINUR NEGATIVE 03/25/2023  0827   KETONESUR NEGATIVE 03/25/2023 0827   PROTEINUR NEGATIVE 03/25/2023 0827   UROBILINOGEN 0.2 01/17/2015 2350   NITRITE NEGATIVE 03/25/2023 0827   LEUKOCYTESUR NEGATIVE 03/25/2023 0827    Radiological Exams on Admission: I have personally reviewed images CT HEAD WO CONTRAST ( )  Result Date: 06/15/2023 CLINICAL DATA:  67 year old female status post fall with wrist fracture. Struck head against wall. On warfarin. EXAM: CT HEAD WITHOUT CONTRAST TECHNIQUE: Contiguous axial images were obtained from the base of the skull through the vertex without intravenous contrast. RADIATION DOSE REDUCTION: This exam was performed according to the departmental dose-optimization program which includes automated exposure control, adjustment of the mA and/or kV according to patient size and/or use of iterative reconstruction technique. COMPARISON:  Head CT 06/14/2023 2322 hours. FINDINGS: Brain: Chronic encephalomalacia in the posterior right hemisphere affecting the occipital, parietal, and posterior right frontal lobe. Stable communication with the right lateral ventricle. Stable ventricle size and configuration. No midline shift or intracranial mass effect. Compared to previous exams including 03/25/2023 head CT there is no convincing acute intracranial hemorrhage. Stable gray-white matter differentiation throughout the brain. No cortically based acute infarct identified. Normal basilar cisterns. Vascular: Some intravascular contrast, earlier CT Chest, Abdomen, and Pelvis with contrast today. Calcified atherosclerosis at the skull base. Skull: Chronic mildly displaced broad-based posterior right craniotomy flap. No acute osseous abnormality identified. Sinuses/Orbits: Paranasal sinuses and mastoids are stable, well aerated. Other: No acute orbit or scalp soft tissue finding. IMPRESSION: 1. Intravascular contrast now from recent CT Chest, Abdomen, and Pelvis today. 2. No acute intracranial abnormality or acute  traumatic injury identified. Electronically Signed   By: Odessa Fleming M.D.   On: 06/15/2023 04:57   CT Wrist Left Wo Contrast  Result Date: 06/15/2023 CLINICAL DATA:  Wrist trauma from fall.  Pain and swelling. EXAM: CT OF THE LEFT WRIST WITHOUT CONTRAST TECHNIQUE: Multidetector CT imaging was performed according to the standard protocol. Multiplanar CT image reconstructions were also generated. RADIATION DOSE REDUCTION: This exam was performed according to the departmental dose-optimization program which includes automated exposure control, adjustment of the mA and/or kV according to patient size and/or use of iterative reconstruction technique. COMPARISON:  Same day radiographs FINDINGS: Bones/Joint/Cartilage Plate and screw fixation of the distal radial metadiaphysis. Transverse fracture at the proximal aspect of the plate. There is radial and dorsal displacement of the distal fragment 1/2 shaft width. Minimally displaced fracture of the distal ulnar metaphysis and styloid. Oblique fracture through the mid third metacarpal diaphysis. Ligaments Suboptimally assessed by CT. Muscles and Tendons Grossly unremarkable. Soft tissues Soft tissue swelling. IMPRESSION: 1. Transverse fracture at the proximal aspect of the distal radial plate with radial and dorsal displacement of the distal fragment 1/2 shaft width. 2. Minimally displaced fracture of the distal ulnar metaphysis and styloid. 3. Oblique fracture through the mid third metacarpal diaphysis. Electronically Signed   By: Angelique Holm.D.  On: 06/15/2023 03:13   CT CHEST ABDOMEN PELVIS W CONTRAST  Result Date: 06/15/2023 CLINICAL DATA:  Polytrauma blunt, fall. EXAM: CT CHEST, ABDOMEN, AND PELVIS WITH CONTRAST TECHNIQUE: Multidetector CT imaging of the chest, abdomen and pelvis was performed following the standard protocol during bolus administration of intravenous contrast. RADIATION DOSE REDUCTION: This exam was performed according to the departmental  dose-optimization program which includes automated exposure control, adjustment of the mA and/or kV according to patient size and/or use of iterative reconstruction technique. CONTRAST:  75mL OMNIPAQUE IOHEXOL 350 MG/ML SOLN COMPARISON:  02/20/2023. FINDINGS: CT CHEST FINDINGS Cardiovascular: Heart is normal in size and there is no pericardial effusion. There is atherosclerotic calcification of the aorta without evidence of aneurysm. The pulmonary trunk is normal in caliber. Mediastinum/Nodes: No enlarged mediastinal, hilar, or axillary lymph nodes. Thyroid gland, trachea, and esophagus demonstrate no significant findings. Lungs/Pleura: Centrilobular emphysematous changes are present in the lungs. Subpleural fibrosis is noted in the anterior aspect of the left upper lobe, likely related to prior radiation therapy. Atelectasis is noted bilaterally. No effusion or pneumothorax is seen. Musculoskeletal: Postsurgical changes are noted in the left breast. Degenerative changes are present in the thoracic spine. No acute fracture is seen. CT ABDOMEN PELVIS FINDINGS Hepatobiliary: No hepatic injury or perihepatic hematoma. Gallbladder is unremarkable. The common bile duct is distended measuring 10 mm. Pancreas: Unremarkable. No pancreatic ductal dilatation or surrounding inflammatory changes. Spleen: No splenic injury or perisplenic hematoma. Adrenals/Urinary Tract: No adrenal hemorrhage or renal injury identified. Bladder is unremarkable. Stomach/Bowel: Stomach is within normal limits. Appendix appears normal. No evidence of bowel wall thickening, distention, or inflammatory changes. Free air or pneumatosis. Vascular/Lymphatic: An IVC filter is noted. There is atherosclerotic calcification of the aorta without evidence of aneurysm. No abdominal or pelvic lymphadenopathy. Reproductive: Uterus and bilateral adnexa are unremarkable. Other: No abdominopelvic ascites. Small fat containing umbilical hernia is present.  Musculoskeletal: Mild degenerative changes in the lumbar spine. No acute fracture is seen. IMPRESSION: 1. No evidence of solid organ injury or acute fracture. 2. Mild dilatation of the common bile duct measuring 10 mm. Consider nonemergent MRCP for further evaluation. 3. Emphysema. 4. Aortic atherosclerosis. Electronically Signed   By: Thornell Sartorius M.D.   On: 06/15/2023 03:10   DG Wrist Complete Left  Result Date: 06/15/2023 LATERAL DISPLACEMENT CLINICAL DATA: Postreduction. EXAM: LEFT WRIST - COMPLETE 3+ VIEW; LEFT FOREARM - 2 VIEW COMPARISON:  Radiographs 06/14/2023 and 06/15/2023 FINDINGS: No change in the acute comminuted displaced fracture in the distal left radial diaphysis. The fracture line is predominantly transverse in orientation it occurs at the proximal aspect of the plate and screw fixation of the distal left radial metadiaphysis. Unchanged dorsal and lateral displacement of the distal fragment. New or more conspicuous minimally displaced oblique fracture through the distal ulnar metaphysis and ulnar styloid. The volar ulnar displacement has been reduced. Soft tissue swelling. Oblique fracture through the mid third metacarpal diaphysis. IMPRESSION: 1. Unchanged comminuted displaced fracture in the distal left radial diaphysis about the plate and screw fixation. 2. New or more conspicuous minimally displaced oblique fracture through the distal ulnar metaphysis and ulnar styloid. Successful reduction of the distal ulna dislocation. 3. Unchanged oblique fracture through the mid third metacarpal diaphysis. Electronically Signed   By: Minerva Fester M.D.   On: 06/15/2023 02:17   DG Forearm Left  Result Date: 06/15/2023 LATERAL DISPLACEMENT CLINICAL DATA: Postreduction. EXAM: LEFT WRIST - COMPLETE 3+ VIEW; LEFT FOREARM - 2 VIEW COMPARISON:  Radiographs 06/14/2023 and 06/15/2023  FINDINGS: No change in the acute comminuted displaced fracture in the distal left radial diaphysis. The fracture line is  predominantly transverse in orientation it occurs at the proximal aspect of the plate and screw fixation of the distal left radial metadiaphysis. Unchanged dorsal and lateral displacement of the distal fragment. New or more conspicuous minimally displaced oblique fracture through the distal ulnar metaphysis and ulnar styloid. The volar ulnar displacement has been reduced. Soft tissue swelling. Oblique fracture through the mid third metacarpal diaphysis. IMPRESSION: 1. Unchanged comminuted displaced fracture in the distal left radial diaphysis about the plate and screw fixation. 2. New or more conspicuous minimally displaced oblique fracture through the distal ulnar metaphysis and ulnar styloid. Successful reduction of the distal ulna dislocation. 3. Unchanged oblique fracture through the mid third metacarpal diaphysis. Electronically Signed   By: Minerva Fester M.D.   On: 06/15/2023 02:17   DG Hip Unilat W or Wo Pelvis 2-3 Views Left  Result Date: 06/15/2023 CLINICAL DATA:  Fall. EXAM: DG HIP (WITH OR WITHOUT PELVIS) 2-3V LEFT COMPARISON:  None Available. FINDINGS: There is no evidence of hip fracture or dislocation. Degenerative changes are noted in the lower lumbar spine and bilateral hips. An IVC filter is present. IMPRESSION: No acute fracture or dislocation. Electronically Signed   By: Thornell Sartorius M.D.   On: 06/15/2023 02:12   DG Hand Complete Left  Result Date: 06/15/2023 CLINICAL DATA:  fracture.  Fall EXAM: LEFT HAND - COMPLETE 3+ VIEW COMPARISON:  X-ray left forearm 06/14/2023 FINDINGS: Acute displaced periprosthetic distal radial fracture along the proximal aspect of a prior plate and screw fixation. Distal volar ulnar dislocation. Acute displaced diagonal fracture of the third digit metacarpa with possible intra-articular extension to the base. IMPRESSION: 1. Acute displaced periprosthetic distal radial fracture along the proximal aspect of a prior plate and screw fixation. 2. Distal volar  ulnar dislocation. 3. Acute displaced diagonal fracture of the third digit metacarpal body with possible intra-articular extension to the base. Electronically Signed   By: Tish Frederickson M.D.   On: 06/15/2023 00:55   CT Head Wo Contrast  Result Date: 06/14/2023 CLINICAL DATA:  Fall, on blood thinners EXAM: CT HEAD WITHOUT CONTRAST CT CERVICAL SPINE WITHOUT CONTRAST TECHNIQUE: Multidetector CT imaging of the head and cervical spine was performed following the standard protocol without intravenous contrast. Multiplanar CT image reconstructions of the cervical spine were also generated. RADIATION DOSE REDUCTION: This exam was performed according to the departmental dose-optimization program which includes automated exposure control, adjustment of the mA and/or kV according to patient size and/or use of iterative reconstruction technique. COMPARISON:  03/25/2023 CT head and cervical spine FINDINGS: CT HEAD FINDINGS Brain: No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

## 2023-06-15 NOTE — Consult Note (Signed)
Chart reviewed Xrays reviewed Pt will need surgery No surgery tomorrow If an inpatient can plan for surgery on Wednesday On surgery schedule for Wednesday afternoon. Case posted Will place orders tomorrow Please contact me if any questions [phone]

## 2023-06-15 NOTE — Progress Notes (Signed)
PHARMACY - ANTICOAGULATION CONSULT NOTE  Pharmacy Consult for heparin Indication: atrial fibrillation and chronic lower extremity DVT  Allergies  Allergen Reactions   Zonegran [Zonisamide] Other (See Comments)    Numbness and tingling over whole body   Chocolate Other (See Comments)    Triggers seizures   Codeine Nausea And Vomiting   Keppra [Levetiracetam] Other (See Comments)    Causes seizures   Lyrica [Pregabalin] Nausea And Vomiting   Olive Oil Rash    Patient Measurements: Height: 5' 6.5" (168.9 cm) Weight: 58 kg (127 lb 12.8 oz) IBW/kg (Calculated) : 60.45 Heparin Dosing Weight: 58 Kg  Vital Signs: Temp: 98 F (36.7 C) (11/11 1459) Temp Source: Oral (11/11 1459) BP: 135/68 (11/11 1459) Pulse Rate: 80 (11/11 1459)  Labs: Recent Labs    06/14/23 2230 06/15/23 0455 06/15/23 1300  HGB 11.9* 11.9*  --   HCT 37.1 36.5  --   PLT 113* 83*  --   LABPROT 26.0* 26.9* 21.1*  INR 2.4* 2.5* 1.8*  CREATININE 0.69 0.66  --     Estimated Creatinine Clearance: 64.2 mL/min (by C-G formula based on SCr of 0.66 mg/dL).   Medical History: Past Medical History:  Diagnosis Date   Acute bronchitis 08/06/2022   Acute respiratory failure with hypoxia (HCC) 03/31/2015   Altered mental status    Asthma    Ataxia 08/26/2019   Atypical chest pain 02/17/2017   Cerebral hemorrhage (HCC) 1989   Chronic back pain    Closed fracture of distal end of left radius 07/17/2022   Closed fracture of fifth metatarsal bone 01/04/2019   Closed fracture of proximal phalanx of great toe 01/04/2019   Closed nondisplaced fracture of styloid process of left radius 08/06/2022   COPD (chronic obstructive pulmonary disease) (HCC)    no meds per daughter   COPD with exacerbation (HCC) 03/30/2015   Costochondritis 02/17/2017   COVID-19 virus infection 04/02/2020   DDD (degenerative disc disease) 06/12/2013   DVT (deep venous thrombosis) (HCC)    "she's had several since 1990"   GERD  (gastroesophageal reflux disease)    no meds per daughter   Headache    "usually around time when she's had a seizure"   History of blood transfusion    "when she was a baby"   History of kidney stones    History of stomach ulcers    Hypoxia 01/18/2015   Lactic acidosis 04/02/2020   Neuropathy    Seizures (HCC)    "because of her brain surgery"  last one 07/28/22   Sepsis secondary to UTI (HCC) 01/17/2015   Stroke [facility]) 1990's   family denies residual on 11/09/2014   Tunnel vision    "since brain OR"      Assessment: 33 yoF here with fall on warfarin PTA for atrial fibrillation and history of DVT. INR 2.5 on arrival and vitamin K administered for possible emergent surgery. Repeat INR down to 1.8 and surgery has been tentatively scheduled for Wednesday 11/13. Heparin will be initiated per pharmacy consult for bridge therapy.    Goal of Therapy:  Heparin level 0.3-0.7 units/ml Monitor platelets by anticoagulation protocol: Yes   Plan:  Start heparin infusion at 800 units/hr Check anti-Xa level in 8 hours and daily while on heparin Continue to monitor H&H and platelets  Ruben Im, PharmD Clinical Pharmacist 06/15/2023 3:05 PM Please check AMION for all [facility] Pharmacy numbers

## 2023-06-15 NOTE — Evaluation (Signed)
Occupational Therapy Evaluation Patient Details Name: [name] MRN: [id number] DOB: [birth year] Today's Date: 06/15/2023   History of Present Illness [name] is a 67 y.o. female who presented to emergency department for evaluation for fall at home. X-rays revealed acute displaced periprosthetic distal radial fracture along the proximal aspect of a prior plate and screw fixation, ulnar fx, and acute displaced diagonal fracture of the third digit metacarpal body with possible intra-articular extension to the base.  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   Clinical Impression   Pt currently at mod assist level for simulated toilet transfers with mod to max for selfcare sit to stand secondary to not being able to use the LUE functionally.  Pt's O2 sats at 100% on 2Ls nasal cannula but dropping down to 88% on room air while in supine.  Per pt report she was independent at home with ADLs and mobility and was helping to look after her mother-in-law.  She lives with her daughter and other family who can provide 24 hour supervision as needed.  Feel pt will benefit from acute care OT at this time to help increase basic ADL independence and transfers following current weightbearing limitations in the RUE.  Recommend HHOT at discharge to continue progression toward modified independent level.         If plan is discharge home, recommend the following: A little help with walking  and/or transfers;A lot of help with bathing/dressing/bathroom;Help with stairs or ramp for entrance;Assist for transportation;Assistance with cooking/housework    Functional Status Assessment  Patient has had a recent decline in their functional status and demonstrates the ability to make significant improvements in function in a reasonable and predictable amount of time.  Equipment Recommendations  None recommended by OT       Precautions / Restrictions Precautions Precautions: Fall Required Braces or Orthoses: Splint/Cast;Sling Splint/Cast: LUE sugartong splint in place Restrictions Weight Bearing Restrictions: Yes LUE Weight Bearing: Non weight bearing Other Position/Activity Restrictions: No specific order written but based on fxs.      Mobility Bed Mobility Overal bed mobility: Needs Assistance Bed Mobility: Supine to Sit, Sit to Supine     Supine to sit: Min assist, HOB elevated Sit to supine: Min assist   General bed mobility comments: Assist with bringing trunk up to sitting with stretcher slightly elevated.    Transfers Overall transfer level: Needs assistance Equipment used: None Transfers: Sit to/from Stand, Bed to chair/wheelchair/BSC Sit to Stand: Min assist     Step pivot transfers: Mod assist     General transfer comment: Frequent LOB posteriorly with mobility hand held assist on the left side.      Balance Overall balance assessment: Needs assistance Sitting-balance support: Single extremity supported Sitting balance-Leahy Scale: Fair     Standing balance support: During functional activity, Single extremity supported Standing balance-Leahy Scale: Poor Standing balance comment: Pt needs mod assist for standing balance with transfers and mobility.                           ADL either performed or  assessed with clinical judgement   ADL Overall ADL's : Needs assistance/impaired Eating/Feeding: Supervision/ safety;Sitting   Grooming:  Minimal assistance;Sitting Grooming Details (indicate cue type and reason): simulated Upper Body Bathing: Minimal assistance;Sitting Upper Body Bathing Details (indicate cue type and reason): simulated Lower Body Bathing: Sit to/from stand;Moderate assistance Lower Body Bathing Details (indicate cue type and reason): simulated     Lower Body Dressing: Maximal assistance;Sitting/lateral leans Lower Body Dressing Details (indicate cue type and reason): including sling Toilet Transfer: Moderate assistance;Ambulation Toilet Transfer Details (indicate cue type and reason): simulated Toileting- Clothing Manipulation and Hygiene: Moderate assistance;Sit to/from stand       Functional mobility during ADLs: Moderate assistance (Mod assist for functional mobility in the room without use of an assistive device.) General ADL Comments: Pt's daughter present for session and reports that pt will have 24 hour assist as needed post discharge.  Recommend 3:1 for home which pt's daughter reports already having at home.     Vision Baseline Vision/History: 0 No visual deficits Ability to See in Adequate Light: 0 Adequate Patient Visual Report: No change from baseline Vision Assessment?: No apparent visual deficits     Perception Perception: Within Functional Limits       Praxis Praxis: WFL       Pertinent Vitals/Pain Pain Assessment Pain Assessment: Faces Faces Pain Scale: Hurts little more Pain Location: left arm and left leg Pain Descriptors / Indicators: Discomfort Pain Intervention(s): Limited activity within patient's tolerance, Repositioned     Extremity/Trunk Assessment Upper Extremity Assessment Upper Extremity Assessment: LUE deficits/detail LUE Deficits / Details: Pt with sugartong splint in place and sling on the LUE.  Sling position to proximal phalangeal joints secondary to need to stablize the metacarpals.  Pt able to flex digits slightly but not extend.  Increased pain if  therapist tries to extend them further. LUE Sensation: decreased light touch LUE Coordination: decreased fine motor;decreased gross motor   Lower Extremity Assessment Lower Extremity Assessment: Defer to PT evaluation   Cervical / Trunk Assessment Cervical / Trunk Assessment: Normal   Communication Communication Communication: No apparent difficulties   Cognition Arousal: Alert Behavior During Therapy: WFL for tasks assessed/performed Overall Cognitive Status: Within Functional Limits for tasks assessed                                                  Home Living Family/patient expects to be discharged to:: Private residence Living Arrangements: Children Available Help at Discharge: Available 24 hours/day (usually there all the time between family) Type of Home: House Home Access: Stairs to enter     Home Layout: Two level Alternate Level Stairs-Number of Steps: flight, but states stair lift.   Bathroom Shower/Tub: Producer, television/film/video: Standard     Home Equipment: Agricultural consultant (2 wheels);Shower seat   Additional Comments: Pt has a stair lift and a shower seat      Prior Functioning/Environment Prior Level of Function : Independent/Modified Independent             Mobility Comments: Independent  without an AD per her report ADLs Comments: Pt reports Independent with ADLs. Per chart review, pt receives assistance with transportation and meal prep.        OT Problem List: Decreased strength;Impaired balance (sitting and/or standing);Pain;Decreased range of motion;Decreased activity tolerance;Decreased knowledge of use of DME or  AE;Impaired UE functional use;Decreased coordination      OT Treatment/Interventions: Self-care/ADL training;DME and/or AE instruction;Therapeutic activities;Balance training;Patient/family education;Therapeutic exercise    OT Goals(Current goals can be found in the care plan section) Acute Rehab OT  Goals Patient Stated Goal: Pt states she will not go to SNF. OT Goal Formulation: With patient/family Time For Goal Achievement: 06/29/23 Potential to Achieve Goals: Good  OT Frequency: Min 1X/week       AM-PAC OT "6 Clicks" Daily Activity     Outcome Measure Help from another person eating meals?: A Little Help from another person taking care of personal grooming?: A Little Help from another person toileting, which includes using toliet, bedpan, or urinal?: A Lot Help from another person bathing (including washing, rinsing, drying)?: A Lot Help from another person to put on and taking off regular upper body clothing?: A Lot Help from another person to put on and taking off regular lower body clothing?: A Lot 6 Click Score: 14   End of Session Equipment Utilized During Treatment: Other (comment) (LUE sling) Nurse Communication: Mobility status;Other (comment) (request for something to drink)  Activity Tolerance: Patient limited by pain Patient left: in bed;with call bell/phone within reach;with family/visitor present  OT Visit Diagnosis: Unsteadiness on feet (R26.81);Other abnormalities of gait and mobility (R26.89);Repeated falls (R29.6);Pain Pain - Right/Left: Left Pain - part of body: Arm                Time: 6213-0865 OT Time Calculation (min): 27 min Charges:  OT General Charges $OT Visit: 1 Visit OT Evaluation $OT Eval Moderate Complexity: 1 Mod OT Treatments $Self Care/Home Management : 8-22 mins  Perrin Maltese, OTR/L Acute Rehabilitation Services  Office [phone] 06/15/2023

## 2023-06-15 NOTE — ED Notes (Signed)
Sister Alonna Minium [phone] would like an update asap
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
## 2023-06-15 NOTE — ED Notes (Signed)
ED TO INPATIENT HANDOFF REPORT  ED Nurse Name and Phone #: Theophilus Bones [phone]  S Name/Age/Gender [name] 67 y.o. female Room/Bed: 045C/045C  Code Status   Code Status: Full Code  Home/SNF/Other Home Patient oriented to: self, place, time, and situation Is this baseline? Yes   Triage Complete: Triage complete  Chief Complaint Closed left radial fracture [S52.92XA]  Triage Note Pt is coming from home, pt states she was walking he foot slipped and caused her fall, deformed fracture to left wrist, she is on a blood thinner. Medic gave her of fentanyl and splinted the wrist.  133/62 84pr 98%ra    Allergies Allergies  Allergen Reactions   Zonegran [Zonisamide] Other (See Comments)    Numbness and tingling over whole body   Chocolate Other (See Comments)    Triggers seizures   Codeine Nausea And Vomiting   Keppra [Levetiracetam] Other (See Comments)    Causes seizures   Lyrica [Pregabalin] Nausea And Vomiting   Olive Oil Rash    Level of Care/Admitting Diagnosis ED Disposition     ED Disposition  Admit   Condition  --   Comment  Hospital Area: [facility] [[id number]]  Level of Care: Telemetry Cardiac [[id number]]  May admit patient to [facility] or [facility] if equivalent level of care is available:: No  Covid Evaluation: Asymptomatic - no recent exposure (last 10 days) testing not required  Diagnosis: Closed left radial fracture [[id number]]  Admitting Physician: Tereasa Coop [[id number]]  Attending Physician: Tereasa Coop [[id number]]  Certification:: I certify this patient will need inpatient services for at least 2 midnights  Expected Medical Readiness: 06/19/2023          B Medical/Surgery History Past Medical History:  Diagnosis Date   Acute bronchitis 08/06/2022   Acute respiratory failure with hypoxia (HCC) 03/31/2015   Altered mental status    Asthma    Ataxia 08/26/2019   Atypical chest pain 02/17/2017   Cerebral  hemorrhage (HCC) 1989   Chronic back pain    Closed fracture of distal end of left radius 07/17/2022   Closed fracture of fifth metatarsal bone 01/04/2019   Closed fracture of proximal phalanx of great toe 01/04/2019   Closed nondisplaced fracture of styloid process of left radius 08/06/2022   COPD (chronic obstructive pulmonary disease) (HCC)    no meds per daughter   COPD with exacerbation (HCC) 03/30/2015   Costochondritis 02/17/2017   COVID-19 virus infection 04/02/2020   DDD (degenerative disc disease) 06/12/2013   DVT (deep venous thrombosis) (HCC)    "she's had several since 1990"   GERD (gastroesophageal reflux disease)    no meds per daughter   Headache    "usually around time when she's had a seizure"   History of blood transfusion    "when she was a baby"   History of kidney stones    History of stomach ulcers    Hypoxia 01/18/2015   Lactic acidosis 04/02/2020   Neuropathy    Seizures (HCC)    "because of her brain surgery"  last one 07/28/22   Sepsis secondary to UTI (HCC) 01/17/2015   Stroke [facility]) 1990's   family denies residual on 11/09/2014   Tunnel vision    "since brain OR"   Past Surgical History:  Procedure Laterality Date   BRAIN SURGERY  1989   craniotomy with hematoma evacuation   BREAST BIOPSY Left 12/22/2022   [place] LT BREAST BX W LOC DEV 1ST LESION IMG BX  SPEC US GUIDE 12/22/2022 [facility]   BREAST BIOPSY  01/09/2023   MM LT RADIOACTIVE SEED LOC MAMMO GUIDE 01/09/2023 [facility]   BREAST LUMPECTOMY WITH RADIOACTIVE SEED AND SENTINEL LYMPH NODE BIOPSY Left 01/12/2023   Procedure: LEFT BREAST LUMPECTOMY WITH RADIOACTIVE SEED AND SENTINEL LYMPH NODE BIOPSY;  Surgeon: Manus Rudd, MD;  Location:  SURGERY CENTER;  Service: General;  Laterality: Left;   CESAREAN SECTION  1979; 1987; 1989   HEMORRHOID SURGERY     IVC FILTER INSERTION N/A 10/12/2020   Procedure: IVC FILTER INSERTION;  Surgeon: Chuck Hint, MD;  Location: [facility]  INVASIVE CV LAB;  Service: Cardiovascular;  Laterality: N/A;   OPEN REDUCTION INTERNAL FIXATION (ORIF) DISTAL RADIAL FRACTURE Left 08/25/2022   Procedure: OPEN REDUCTION INTERNAL FIXATION (ORIF) DISTAL RADIUS FRACTURE of nascent malunion;  Surgeon: Bradly Bienenstock, MD;  Location: [facility] OR;  Service: Orthopedics;  Laterality: Left;  regional with iv sedation   TUBAL LIGATION  1990's     A IV Location/Drains/Wounds Patient Lines/Drains/Airways Status     Active Line/Drains/Airways     Name Placement date Placement time Site Days   Peripheral IV 06/15/23 22 G Posterior;Right Hand 06/15/23  0102  Hand  less than 1            Intake/Output Last 24 hours  Intake/Output Summary (Last 24 hours) at 06/15/2023 0847 Last data filed at 06/15/2023 0527 Gross per 24 hour  Intake 240 ml  Output --  Net 240 ml    Labs/Imaging Results for orders placed or performed during the hospital encounter of 06/14/23 (from the past 48 hour(s))  CBC     Status: Abnormal   Collection Time: 06/14/23 10:30 PM  Result Value Ref Range   WBC 4.8 4.0 - 10.5 K/uL   RBC 3.64 (L) 3.87 - 5.11 MIL/uL   Hemoglobin 11.9 (L) 12.0 - 15.0 g/dL   HCT 09.8 11.9 - 14.7 %   MCV 101.9 (H) 80.0 - 100.0 fL   MCH 32.7 26.0 - 34.0 pg   MCHC 32.1 30.0 - 36.0 g/dL   RDW 82.9 56.2 - 13.0 %   Platelets 113 (L) 150 - 400 K/uL    Comment: REPEATED TO VERIFY   nRBC 0.0 0.0 - 0.2 %    Comment: Performed at [facility], [address], [place], [place] [address]  Basic metabolic panel     Status: Abnormal   Collection Time: 06/14/23 10:30 PM  Result Value Ref Range   Sodium 140 135 - 145 mmol/L   Potassium 3.3 (L) 3.5 - 5.1 mmol/L   Chloride 106 98 - 111 mmol/L   CO2 23 22 - 32 mmol/L   Glucose, Bld 147 (H) 70 - 99 mg/dL    Comment: Glucose reference range applies only to samples taken after fasting for at least 8 hours.   BUN 15 8 - 23 mg/dL   Creatinine, Ser 4.69 0.44 - 1.00 mg/dL   Calcium 8.9 8.9 - 62.9 mg/dL    GFR, Estimated >52 >84 mL/min    Comment: (NOTE) Calculated using the CKD-EPI Creatinine Equation (2021)    Anion gap 11 5 - 15    Comment: Performed at [facility], [address], [place], [place] [address]  Protime-INR     Status: Abnormal   Collection Time: 06/14/23 10:30 PM  Result Value Ref Range   Prothrombin Time 26.0 (H) 11.4 - 15.2 seconds   INR 2.4 (H) 0.8 - 1.2  Comment: (NOTE) INR goal varies based on device and disease states. Performed at [facility], [address], [place], [place] [address]   Comprehensive metabolic panel     Status: Abnormal   Collection Time: 06/15/23  4:55 AM  Result Value Ref Range   Sodium 139 135 - 145 mmol/L   Potassium 3.9 3.5 - 5.1 mmol/L   Chloride 109 98 - 111 mmol/L   CO2 22 22 - 32 mmol/L   Glucose, Bld 124 (H) 70 - 99 mg/dL    Comment: Glucose reference range applies only to samples taken after fasting for at least 8 hours.   BUN 11 8 - 23 mg/dL   Creatinine, Ser 7.42 0.44 - 1.00 mg/dL   Calcium 8.2 (L) 8.9 - 10.3 mg/dL   Total Protein 5.5 (L) 6.5 - 8.1 g/dL   Albumin 3.1 (L) 3.5 - 5.0 g/dL   AST 19 15 - 41 U/L   ALT 12 0 - 44 U/L   Alkaline Phosphatase 46 38 - 126 U/L   Total Bilirubin 0.3 <1.2 mg/dL   GFR, Estimated >59 >56 mL/min    Comment: (NOTE) Calculated using the CKD-EPI Creatinine Equation (2021)    Anion gap 8 5 - 15    Comment: Performed at [facility], [address] [place], [place] [address]  Phenytoin level, total     Status: Abnormal   Collection Time: 06/15/23  4:55 AM  Result Value Ref Range   Phenytoin Lvl 9.3 (L) 10.0 - 20.0 ug/mL    Comment: Performed at [facility], [address] [place], [place] [address]  Protime-INR     Status: Abnormal   Collection Time: 06/15/23  4:55 AM  Result Value Ref Range   Prothrombin Time 26.9 (H) 11.4 - 15.2 seconds   INR 2.5 (H) 0.8 - 1.2    Comment: (NOTE) INR goal varies based on device and disease states. Performed at [facility], [address], [place], [place] [address]   CBC     Status: Abnormal   Collection Time: 06/15/23  4:55 AM  Result Value Ref Range   WBC 5.0 4.0 - 10.5 K/uL   RBC 3.55 (L) 3.87 - 5.11 MIL/uL   Hemoglobin 11.9 (L) 12.0 - 15.0 g/dL   HCT 16.6 06.3 - 01.6 %   MCV 102.8 (H) 80.0 - 100.0 fL   MCH 33.5 26.0 - 34.0 pg   MCHC 32.6 30.0 - 36.0 g/dL   RDW 01.0 93.2 - 35.5 %   Platelets 83 (L) 150 - 400 K/uL    Comment: Immature Platelet Fraction may be clinically indicated, consider ordering this additional test [id number] REPEATED TO VERIFY    nRBC 0.0 0.0 - 0.2 %    Comment: Performed at [facility], [address], [place], [place] [address]   CT HEAD WO CONTRAST ( )  Result Date: 06/15/2023 CLINICAL DATA:  67 year old female status post fall with wrist fracture. Struck head against wall. On warfarin. EXAM: CT HEAD WITHOUT CONTRAST TECHNIQUE: Contiguous axial images were obtained from the base of the skull through the vertex without intravenous contrast. RADIATION DOSE REDUCTION: This exam was performed according to the departmental dose-optimization program which includes automated exposure control, adjustment of the mA and/or kV according to patient size and/or use of iterative reconstruction technique. COMPARISON:  Head CT 06/14/2023 2322 hours. FINDINGS: Brain: Chronic encephalomalacia in the posterior right hemisphere affecting the occipital, parietal, and posterior right  frontal lobe. Stable communication with the right lateral ventricle. Stable ventricle size and configuration. No midline shift or intracranial mass effect. Compared to previous exams including 03/25/2023 head CT there is no convincing acute intracranial hemorrhage. Stable gray-white matter differentiation throughout the brain. No cortically based acute infarct identified. Normal basilar cisterns. Vascular: Some intravascular contrast, earlier CT Chest, Abdomen, and Pelvis with contrast today. Calcified  atherosclerosis at the skull base. Skull: Chronic mildly displaced broad-based posterior right craniotomy flap. No acute osseous abnormality identified. Sinuses/Orbits: Paranasal sinuses and mastoids are stable, well aerated. Other: No acute orbit or scalp soft tissue finding. IMPRESSION: 1. Intravascular contrast now from recent CT Chest, Abdomen, and Pelvis today. 2. No acute intracranial abnormality or acute traumatic injury identified. Electronically Signed   By: Odessa Fleming M.D.   On: 06/15/2023 04:57   CT Wrist Left Wo Contrast  Result Date: 06/15/2023 CLINICAL DATA:  Wrist trauma from fall.  Pain and swelling. EXAM: CT OF THE LEFT WRIST WITHOUT CONTRAST TECHNIQUE: Multidetector CT imaging was performed according to the standard protocol. Multiplanar CT image reconstructions were also generated. RADIATION DOSE REDUCTION: This exam was performed according to the departmental dose-optimization program which includes automated exposure control, adjustment of the mA and/or kV according to patient size and/or use of iterative reconstruction technique. COMPARISON:  Same day radiographs FINDINGS: Bones/Joint/Cartilage Plate and screw fixation of the distal radial metadiaphysis. Transverse fracture at the proximal aspect of the plate. There is radial and dorsal displacement of the distal fragment 1/2 shaft width. Minimally displaced fracture of the distal ulnar metaphysis and styloid. Oblique fracture through the mid third metacarpal diaphysis. Ligaments Suboptimally assessed by CT. Muscles and Tendons Grossly unremarkable. Soft tissues Soft tissue swelling. IMPRESSION: 1. Transverse fracture at the proximal aspect of the distal radial plate with radial and dorsal displacement of the distal fragment 1/2 shaft width. 2. Minimally displaced fracture of the distal ulnar metaphysis and styloid. 3. Oblique fracture through the mid third metacarpal diaphysis. Electronically Signed   By: Minerva Fester M.D.   On:  06/15/2023 03:13   CT CHEST ABDOMEN PELVIS W CONTRAST  Result Date: 06/15/2023 CLINICAL DATA:  Polytrauma blunt, fall. EXAM: CT CHEST, ABDOMEN, AND PELVIS WITH CONTRAST TECHNIQUE: Multidetector CT imaging of the chest, abdomen and pelvis was performed following the standard protocol during bolus administration of intravenous contrast. RADIATION DOSE REDUCTION: This exam was performed according to the departmental dose-optimization program which includes automated exposure control, adjustment of the mA and/or kV according to patient size and/or use of iterative reconstruction technique. CONTRAST:  75mL OMNIPAQUE IOHEXOL 350 MG/ML SOLN COMPARISON:  02/20/2023. FINDINGS: CT CHEST FINDINGS Cardiovascular: Heart is normal in size and there is no pericardial effusion. There is atherosclerotic calcification of the aorta without evidence of aneurysm. The pulmonary trunk is normal in caliber. Mediastinum/Nodes: No enlarged mediastinal, hilar, or axillary lymph nodes. Thyroid gland, trachea, and esophagus demonstrate no significant findings. Lungs/Pleura: Centrilobular emphysematous changes are present in the lungs. Subpleural fibrosis is noted in the anterior aspect of the left upper lobe, likely related to prior radiation therapy. Atelectasis is noted bilaterally. No effusion or pneumothorax is seen. Musculoskeletal: Postsurgical changes are noted in the left breast. Degenerative changes are present in the thoracic spine. No acute fracture is seen. CT ABDOMEN PELVIS FINDINGS Hepatobiliary: No hepatic injury or perihepatic hematoma. Gallbladder is unremarkable. The common bile duct is distended measuring 10 mm. Pancreas: Unremarkable. No pancreatic ductal dilatation or surrounding inflammatory changes. Spleen: No splenic injury or perisplenic hematoma. Adrenals/Urinary  Tract: No adrenal hemorrhage or renal injury identified. Bladder is unremarkable. Stomach/Bowel: Stomach is within normal limits. Appendix appears  normal. No evidence of bowel wall thickening, distention, or inflammatory changes. Free air or pneumatosis. Vascular/Lymphatic: An IVC filter is noted. There is atherosclerotic calcification of the aorta without evidence of aneurysm. No abdominal or pelvic lymphadenopathy. Reproductive: Uterus and bilateral adnexa are unremarkable. Other: No abdominopelvic ascites. Small fat containing umbilical hernia is present. Musculoskeletal: Mild degenerative changes in the lumbar spine. No acute fracture is seen. IMPRESSION: 1. No evidence of solid organ injury or acute fracture. 2. Mild dilatation of the common bile duct measuring 10 mm. Consider nonemergent MRCP for further evaluation. 3. Emphysema. 4. Aortic atherosclerosis. Electronically Signed   By: Thornell Sartorius M.D.   On: 06/15/2023 03:10   DG Wrist Complete Left  Result Date: 06/15/2023 LATERAL DISPLACEMENT CLINICAL DATA: Postreduction. EXAM: LEFT WRIST - COMPLETE 3+ VIEW; LEFT FOREARM - 2 VIEW COMPARISON:  Radiographs 06/14/2023 and 06/15/2023 FINDINGS: No change in the acute comminuted displaced fracture in the distal left radial diaphysis. The fracture line is predominantly transverse in orientation it occurs at the proximal aspect of the plate and screw fixation of the distal left radial metadiaphysis. Unchanged dorsal and lateral displacement of the distal fragment. New or more conspicuous minimally displaced oblique fracture through the distal ulnar metaphysis and ulnar styloid. The volar ulnar displacement has been reduced. Soft tissue swelling. Oblique fracture through the mid third metacarpal diaphysis. IMPRESSION: 1. Unchanged comminuted displaced fracture in the distal left radial diaphysis about the plate and screw fixation. 2. New or more conspicuous minimally displaced oblique fracture through the distal ulnar metaphysis and ulnar styloid. Successful reduction of the distal ulna dislocation. 3. Unchanged oblique fracture through the mid third  metacarpal diaphysis. Electronically Signed   By: Minerva Fester M.D.   On: 06/15/2023 02:17   DG Forearm Left  Result Date: 06/15/2023 LATERAL DISPLACEMENT CLINICAL DATA: Postreduction. EXAM: LEFT WRIST - COMPLETE 3+ VIEW; LEFT FOREARM - 2 VIEW COMPARISON:  Radiographs 06/14/2023 and 06/15/2023 FINDINGS: No change in the acute comminuted displaced fracture in the distal left radial diaphysis. The fracture line is predominantly transverse in orientation it occurs at the proximal aspect of the plate and screw fixation of the distal left radial metadiaphysis. Unchanged dorsal and lateral displacement of the distal fragment. New or more conspicuous minimally displaced oblique fracture through the distal ulnar metaphysis and ulnar styloid. The volar ulnar displacement has been reduced. Soft tissue swelling. Oblique fracture through the mid third metacarpal diaphysis. IMPRESSION: 1. Unchanged comminuted displaced fracture in the distal left radial diaphysis about the plate and screw fixation. 2. New or more conspicuous minimally displaced oblique fracture through the distal ulnar metaphysis and ulnar styloid. Successful reduction of the distal ulna dislocation. 3. Unchanged oblique fracture through the mid third metacarpal diaphysis. Electronically Signed   By: Minerva Fester M.D.   On: 06/15/2023 02:17   DG Hip Unilat W or Wo Pelvis 2-3 Views Left  Result Date: 06/15/2023 CLINICAL DATA:  Fall. EXAM: DG HIP (WITH OR WITHOUT PELVIS) 2-3V LEFT COMPARISON:  None Available. FINDINGS: There is no evidence of hip fracture or dislocation. Degenerative changes are noted in the lower lumbar spine and bilateral hips. An IVC filter is present. IMPRESSION: No acute fracture or dislocation. Electronically Signed   By: Thornell Sartorius M.D.   On: 06/15/2023 02:12   DG Hand Complete Left  Result Date: 06/15/2023 CLINICAL DATA:  fracture.  Fall EXAM: LEFT HAND -  COMPLETE 3+ VIEW COMPARISON:  X-ray left forearm 06/14/2023  FINDINGS: Acute displaced periprosthetic distal radial fracture along the proximal aspect of a prior plate and screw fixation. Distal volar ulnar dislocation. Acute displaced diagonal fracture of the third digit metacarpa with possible intra-articular extension to the base. IMPRESSION: 1. Acute displaced periprosthetic distal radial fracture along the proximal aspect of a prior plate and screw fixation. 2. Distal volar ulnar dislocation. 3. Acute displaced diagonal fracture of the third digit metacarpal body with possible intra-articular extension to the base. Electronically Signed   By: Tish Frederickson M.D.   On: 06/15/2023 00:55   CT Head Wo Contrast  Result Date: 06/14/2023 CLINICAL DATA:  Fall, on blood thinners EXAM: CT HEAD WITHOUT CONTRAST CT CERVICAL SPINE WITHOUT CONTRAST TECHNIQUE: Multidetector CT imaging of the head and cervical spine was performed following the standard protocol without intravenous contrast. Multiplanar CT image reconstructions of the cervical spine were also generated. RADIATION DOSE REDUCTION: This exam was performed according to the departmental dose-optimization program which includes automated exposure control, adjustment of the mA and/or kV according to patient size and/or use of iterative reconstruction technique. COMPARISON:  03/25/2023 CT head and cervical spine FINDINGS: CT HEAD FINDINGS Brain: No evidence of acute infarct, hemorrhage, mass, mass effect, or midline shift. No hydrocephalus or extra-axial fluid collection. Redemonstrated sequela of prior right parietooccipital craniotomy and mass resection. Vascular: No hyperdense vessel. Atherosclerotic calcifications in the intracranial carotid and vertebral arteries. Skull: Negative for fracture or focal lesion. Sinuses/Orbits: No acute finding. CT CERVICAL SPINE FINDINGS Alignment: No traumatic listhesis. Skull base and vertebrae: No acute fracture or suspicious osseous lesion. Soft tissues and spinal canal: No  prevertebral fluid or swelling. No visible canal hematoma. Disc levels: Degenerative changes in the cervical spine.No high-grade spinal canal stenosis. Upper chest: No focal pulmonary opacity or pleural effusion. IMPRESSION: 1. No acute intracranial process. 2. No acute fracture or traumatic listhesis in the cervical spine. Electronically Signed   By: Wiliam Ke M.D.   On: 06/14/2023 23:38   CT Cervical Spine Wo Contrast  Result Date: 06/14/2023 CLINICAL DATA:  Fall, on blood thinners EXAM: CT HEAD WITHOUT CONTRAST CT CERVICAL SPINE WITHOUT CONTRAST TECHNIQUE: Multidetector CT imaging of the head and cervical spine was performed following the standard protocol without intravenous contrast. Multiplanar CT image reconstructions of the cervical spine were also generated. RADIATION DOSE REDUCTION: This exam was performed according to the departmental dose-optimization program which includes automated exposure control, adjustment of the mA and/or kV according to patient size and/or use of iterative reconstruction technique. COMPARISON:  03/25/2023 CT head and cervical spine FINDINGS: CT HEAD FINDINGS Brain: No evidence of acute infarct, hemorrhage, mass, mass effect, or midline shift. No hydrocephalus or extra-axial fluid collection. Redemonstrated sequela of prior right parietooccipital craniotomy and mass resection. Vascular: No hyperdense vessel. Atherosclerotic calcifications in the intracranial carotid and vertebral arteries. Skull: Negative for fracture or focal lesion. Sinuses/Orbits: No acute finding. CT CERVICAL SPINE FINDINGS Alignment: No traumatic listhesis. Skull base and vertebrae: No acute fracture or suspicious osseous lesion. Soft tissues and spinal canal: No prevertebral fluid or swelling. No visible canal hematoma. Disc levels: Degenerative changes in the cervical spine.No high-grade spinal canal stenosis. Upper chest: No focal pulmonary opacity or pleural effusion. IMPRESSION: 1. No acute  intracranial process. 2. No acute fracture or traumatic listhesis in the cervical spine. Electronically Signed   By: Wiliam Ke M.D.   On: 06/14/2023 23:38   DG Wrist Complete Left  Result Date: 06/14/2023 CLINICAL  DATA:  Status post fall. EXAM: LEFT WRIST - COMPLETE 3+ VIEW COMPARISON:  None Available. FINDINGS: A radiopaque fixation plate and screws are seen along the distal left radius. Acute, displaced fracture of the distal left radial shaft is also seen with approximately 1 shaft width dorsal displacement of the distal fracture site. A displaced fracture fragment of unclear origin is seen adjacent to the proximal portions of the left lunate bone and left triquetrum bone. An additional nondisplaced fracture is seen involving the proximal to midportion of the third left metacarpal. Moderate severity soft tissue swelling is seen along the previously noted fracture sites. IMPRESSION: 1. Acute, displaced fracture of the distal left radial shaft. 2. Displaced fracture fragment of unclear origin adjacent to the proximal portions of the left lunate bone and left triquetrum bone. CT correlation is recommended. 3. Nondisplaced fracture of the third left metacarpal. Electronically Signed   By: Aram Candela M.D.   On: 06/14/2023 23:18   DG Forearm Left  Result Date: 06/14/2023 CLINICAL DATA:  Status post fall. EXAM: LEFT FOREARM - 2 VIEW COMPARISON:  None Available. FINDINGS: A radiopaque fixation plate and screws are seen along the distal aspect of the left radius. An acute fracture of the distal left radial shaft is also seen, with approximately 1 shaft width dorsal displacement of the distal fracture site. The distal left ulna is limited in evaluation secondary to multiple overlapping osseous structures. An 11 mm x 8 mm cortical fragment of unclear origin is seen proximal to the left lunate and left triquetrum bones. A nondisplaced fracture of the proximal to mid portion of the third left metacarpal  is noted. Moderate severity soft tissue swelling is seen adjacent to the previously noted fracture site. IMPRESSION: 1. Acute fracture of the distal left radial shaft. 2. 11 mm x 8 mm cortical fragment of unclear origin proximal to the left lunate and left triquetrum bones. CT correlation is recommended. 3. Acute fracture of the third left metacarpal. Electronically Signed   By: Aram Candela M.D.   On: 06/14/2023 23:15   DG Elbow Complete Left  Result Date: 06/14/2023 CLINICAL DATA:  Status post fall. EXAM: LEFT ELBOW - COMPLETE 3+ VIEW COMPARISON:  None Available. FINDINGS: There is no evidence of fracture, dislocation, or joint effusion. There is no evidence of arthropathy or other focal bone abnormality. Soft tissues are unremarkable. IMPRESSION: Negative. Electronically Signed   By: Aram Candela M.D.   On: 06/14/2023 23:09   DG Shoulder Left  Result Date: 06/14/2023 CLINICAL DATA:  Status post fall. EXAM: LEFT SHOULDER - 2+ VIEW COMPARISON:  None Available. FINDINGS: A small area of cortical irregularity is seen along the inferior medial aspect of the left scapula. This is of indeterminate age. There is no evidence of dislocation. There is no evidence of significant arthropathy. Radiopaque surgical clips are seen overlying the left axilla and lateral aspect of the mid left chest wall. Soft tissues are otherwise unremarkable. IMPRESSION: Findings which may represent a small fracture involving the inferior medial aspect of the left scapula. CT correlation is recommended. Electronically Signed   By: Aram Candela M.D.   On: 06/14/2023 23:06    Pending Labs Unresulted Labs (From admission, onward)     Start     Ordered   06/15/23 1300  Protime-INR  Once,   R        06/15/23 0700   06/15/23 0500  Comprehensive metabolic panel  Daily,   R      06/15/23  0356   06/15/23 0500  CBC  Daily,   R      06/15/23 0356   06/15/23 0500  Protime-INR  Daily,   R      06/15/23 0452             Vitals/Pain Today's Vitals   06/15/23 0550 06/15/23 0636 06/15/23 0700 06/15/23 0800  BP:   131/69 125/70  Pulse: 82  70 70  Resp: 12  16   Temp:      TempSrc:      SpO2: 99%  100% 100%  Weight:      Height:      PainSc: 6  7       Isolation Precautions No active isolations  Medications Medications  midazolam (VERSED) 2 MG/2ML injection (  Not Given 06/15/23 0146)  anastrozole (ARIMIDEX) tablet 1 mg (has no administration in time range)  famotidine (PEPCID) tablet 40 mg (has no administration in time range)  phenytoin (DILANTIN) ER capsule 100 mg (100 mg Oral Given 06/15/23 0636)  phenytoin (DILANTIN) ER capsule 130 mg (has no administration in time range)  lacosamide (VIMPAT) tablet 200 mg (has no administration in time range)  sodium chloride flush (NS) 0.9 % injection 3 mL (has no administration in time range)  dextrose 5 % in lactated ringers infusion ( Intravenous New Bag/Given 06/15/23 0505)  sodium chloride flush (NS) 0.9 % injection 3 mL (has no administration in time range)  sodium chloride flush (NS) 0.9 % injection 3 mL (has no administration in time range)  0.9 %  sodium chloride infusion (has no administration in time range)  acetaminophen (TYLENOL) tablet 650 mg (has no administration in time range)    Or  acetaminophen (TYLENOL) suppository 650 mg (has no administration in time range)  senna-docusate (Senokot-S) tablet 1 tablet (has no administration in time range)  ondansetron (ZOFRAN) tablet 4 mg (has no administration in time range)    Or  ondansetron (ZOFRAN) injection 4 mg (has no administration in time range)  oxyCODONE-acetaminophen (PERCOCET/ROXICET) 5-325 MG per tablet 1 tablet (1 tablet Oral Given by Other 06/15/23 0554)  levalbuterol (XOPENEX) nebulizer solution 0.63 mg (has no administration in time range)  folic acid (FOLVITE) tablet 1 mg (has no administration in time range)  cyanocobalamin (VITAMIN B12) tablet 2,000 mcg (has no  administration in time range)  fentaNYL (SUBLIMAZE) injection 25 mcg (25 mcg Intravenous Given 06/15/23 0525)  phytonadione (VITAMIN K) 2.5 mg in dextrose 5 % 50 mL IVPB (2.5 mg Intravenous New Bag/Given 06/15/23 0824)  morphine (PF) 4 MG/ML injection 4 mg (4 mg Intravenous Given 06/14/23 2158)  ondansetron (ZOFRAN) injection 4 mg (4 mg Intravenous Given 06/14/23 2157)  morphine (PF) 4 MG/ML injection 4 mg (4 mg Intravenous Given 06/15/23 0000)  propofol (DIPRIVAN) 10 mg/mL bolus/IV push 58 mg (58 mg Intravenous Given 06/15/23 0046)  ketamine 50 mg in normal saline 5 mL (10 mg/mL) syringe (29 mg Intravenous Given 06/15/23 0045)  propofol (DIPRIVAN) 10 mg/mL bolus/IV push (50 mg Intravenous Given 06/15/23 0105)  ketamine (KETALAR) injection (21 mg Intravenous Given 06/15/23 0045)  midazolam (VERSED) 5 MG/5ML injection (2 mg Intravenous Given 06/15/23 0103)  iohexol (OMNIPAQUE) 350 MG/ML injection 75 mL (75 mLs Intravenous Contrast Given 06/15/23 0247)  fentaNYL (SUBLIMAZE) injection 50 mcg (50 mcg Intravenous Given 06/15/23 0431)  potassium chloride (KLOR-CON) packet 40 mEq (40 mEq Oral Given 06/15/23 0527)    Mobility walks     Focused Assessments Musculoskeletal   R Recommendations: See  Admitting Provider Note  Report given to:   Additional Notes:

## 2023-06-15 NOTE — Evaluation (Signed)
Physical Therapy Evaluation Patient Details Name: [name] MRN: [id number] DOB: [birth year] Today's Date: 06/15/2023  History of Present Illness  [name] is a 67 y.o. female who presented to emergency department for evaluation for fall at home. X-rays revealed acute displaced periprosthetic distal radial fracture along the proximal aspect of a prior plate and screw fixation, ulnar fx, and acute displaced diagonal fracture of the third digit metacarpal body with possible intra-articular extension to the base.  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  Clinical Impression  Pt admitted with/for multiple L forearm hand fx as described above.  Pt presently not at her baseline without use of L UE and due to L hip pain, needing CG to minimal assist overall..  Pt currently limited functionally due to the problems listed below.  (see problems list.)  Pt will benefit from PT to maximize function and safety to be able to get home safely with available assist .         If plan is discharge home, recommend the following: A little help with bathing/dressing/bathroom;Assistance with cooking/housework;Assist for transportation;Help with stairs or ramp for entrance   Can travel by private vehicle        Equipment Recommendations None recommended by PT  Recommendations for Other Services       Functional Status Assessment Patient has had a recent decline in their functional  status and demonstrates the ability to make significant improvements in function in a reasonable and predictable amount of time.     Precautions / Restrictions Precautions Precautions: Fall Required Braces or Orthoses: Splint/Cast;Sling Splint/Cast: LUE sugartong splint in place Restrictions Weight Bearing Restrictions: Yes LUE Weight Bearing: Non weight bearing Other Position/Activity Restrictions: No specific order written but based on fxs.      Mobility  Bed Mobility Overal bed mobility: Needs Assistance Bed Mobility: Supine to Sit, Sit to Supine     Supine to sit: Min assist Sit to supine: Contact guard assist        Transfers Overall transfer level: Needs assistance   Transfers: Sit to/from Stand Sit to Stand: Min assist           General transfer comment: more stable than during the OT evaluation.  Stability assist only    Ambulation/Gait Ambulation/Gait assistance: Min assist Gait Distance (Feet): 20 Feet (then 10 feet more   with trip to bathroom) Assistive device: 1 person hand held assist, IV Pole Gait Pattern/deviations: Step-through pattern   Gait velocity interpretation: <1.31 ft/sec, indicative of household ambulator   General Gait Details: antalgic on the L LE, slower and mildly unsteady at times.  Stairs            Wheelchair Mobility     Tilt Bed    Modified Rankin (Stroke Patients Only)       Balance Overall balance assessment: Needs assistance Sitting-balance support: Single extremity supported Sitting balance-Leahy Scale: Fair     Standing balance support: During functional activity, Single extremity supported Standing balance-Leahy Scale: Poor Standing balance comment: Pt needs mod assist for standing balance with transfers and mobility.  Pertinent Vitals/Pain Pain Assessment Pain Assessment: Faces Faces Pain Scale: Hurts even more Pain Location: left arm and left leg Pain  Descriptors / Indicators: Discomfort Pain Intervention(s): Patient requesting pain meds-RN notified    Home Living Family/patient expects to be discharged to:: Private residence Living Arrangements: Children Available Help at Discharge: Available 24 hours/day Type of Home: House Home Access: Stairs to enter Entrance Stairs-Rails: None Entrance Stairs-Number of Steps: unknown number of steps, unknown rails. Alternate Level Stairs-Number of Steps: flight, but states stair lift. Home Layout: Two level Home Equipment: Agricultural consultant (2 wheels);Shower seat Additional Comments: Pt has a stair lift and a shower seat    Prior Function Prior Level of Function : Independent/Modified Independent             Mobility Comments: Independent  without an AD per her report ADLs Comments: Pt reports Independent with ADLs. Per chart review, pt receives assistance with transportation and meal prep.     Extremity/Trunk Assessment   Upper Extremity Assessment Upper Extremity Assessment: Defer to OT evaluation LUE Deficits / Details: Pt with sugartong splint in place and sling on the LUE.  Sling position to proximal phalangeal joints secondary to need to stablize the metacarpals.  Pt able to flex digits slightly but not extend.  Increased pain if therapist tries to extend them further. LUE Sensation: decreased light touch LUE Coordination: decreased fine motor;decreased gross motor    Lower Extremity Assessment Lower Extremity Assessment: Overall WFL for tasks assessed;LLE deficits/detail LLE Deficits / Details: post fall hip pain, likely from bruising.    Cervical / Trunk Assessment Cervical / Trunk Assessment: Normal  Communication   Communication Communication: No apparent difficulties  Cognition Arousal: Alert Behavior During Therapy: WFL for tasks assessed/performed Overall Cognitive Status: Within Functional Limits for tasks assessed                                           General Comments General comments (skin integrity, edema, etc.): At end of session, pt relayed she had been dizzy with some spinning.  Will see if a vestibular therapist can do a basic assessment for BPPV.    Exercises     Assessment/Plan    PT Assessment Patient needs continued PT services  PT Problem List Decreased activity tolerance;Decreased balance;Decreased mobility;Pain       PT Treatment Interventions Gait training;Stair training;Functional mobility training;Therapeutic activities;Balance training;Patient/family education    PT Goals (Current goals can be found in the Care Plan section)  Acute Rehab PT Goals Patient Stated Goal: back to my PLOF PT Goal Formulation: With patient Time For Goal Achievement: 06/22/23 Potential to Achieve Goals: Good    Frequency Min 1X/week     Co-evaluation               AM-PAC PT "6 Clicks" Mobility  Outcome Measure Help needed turning from your back to your side while in a flat bed without using bedrails?: A Little Help needed moving from lying on your back to sitting on the side of a flat bed without using bedrails?: A Little Help needed moving to and from a bed to a chair (including a wheelchair)?: A Little Help needed standing up from a chair using your arms (e.g., wheelchair or bedside chair)?: A Little Help needed to walk in hospital room?: A Little Help needed climbing 3-5 steps with a railing? : A Little 6  Click Score: 18    End of Session   Activity Tolerance: Patient tolerated treatment well;Patient limited by pain (minor dizziness) Patient left: in bed;with call bell/phone within reach Nurse Communication: Mobility status PT Visit Diagnosis: Unsteadiness on feet (R26.81);Other abnormalities of gait and mobility (R26.89);Pain Pain - Right/Left: Left Pain - part of body: Hand;Hip    Time: 1610-9604 PT Time Calculation (min) (ACUTE ONLY): 29 min   Charges:   PT Evaluation $PT Eval Moderate Complexity:  1 Mod PT Treatments $Gait Training: 8-22 mins PT General Charges $$ ACUTE PT VISIT: 1 Visit         06/15/2023  Jacinto Halim., PT Acute Rehabilitation Services [phone]  (office)  Eliseo Gum Raye Slyter 06/15/2023, 6:15 PM

## 2023-06-15 NOTE — Telephone Encounter (Signed)
Spoke with patient confirming upcoming appointment  

## 2023-06-16 ENCOUNTER — Inpatient Hospital Stay (HOSPITAL_COMMUNITY): Payer: Medicare HMO

## 2023-06-16 DIAGNOSIS — J449 Chronic obstructive pulmonary disease, unspecified: Secondary | ICD-10-CM

## 2023-06-16 DIAGNOSIS — S62102A Fracture of unspecified carpal bone, left wrist, initial encounter for closed fracture: Secondary | ICD-10-CM

## 2023-06-16 DIAGNOSIS — Z86718 Personal history of other venous thrombosis and embolism: Secondary | ICD-10-CM | POA: Diagnosis not present

## 2023-06-16 DIAGNOSIS — M79662 Pain in left lower leg: Secondary | ICD-10-CM

## 2023-06-16 DIAGNOSIS — I82A21 Chronic embolism and thrombosis of right axillary vein: Secondary | ICD-10-CM | POA: Diagnosis not present

## 2023-06-16 LAB — CBC
HCT: 40.1 % (ref 36.0–46.0)
Hemoglobin: 13.4 g/dL (ref 12.0–15.0)
MCH: 34 pg (ref 26.0–34.0)
MCHC: 33.4 g/dL (ref 30.0–36.0)
MCV: 101.8 fL — ABNORMAL HIGH (ref 80.0–100.0)
Platelets: 57 10*3/uL — ABNORMAL LOW (ref 150–400)
RBC: 3.94 MIL/uL (ref 3.87–5.11)
RDW: 12.7 % (ref 11.5–15.5)
WBC: 2.8 10*3/uL — ABNORMAL LOW (ref 4.0–10.5)
nRBC: 0 % (ref 0.0–0.2)

## 2023-06-16 LAB — COMPREHENSIVE METABOLIC PANEL
ALT: 12 U/L (ref 0–44)
AST: 18 U/L (ref 15–41)
Albumin: 3.3 g/dL — ABNORMAL LOW (ref 3.5–5.0)
Alkaline Phosphatase: 57 U/L (ref 38–126)
Anion gap: 6 (ref 5–15)
BUN: <5 mg/dL — ABNORMAL LOW (ref 8–23)
CO2: 31 mmol/L (ref 22–32)
Calcium: 8.9 mg/dL (ref 8.9–10.3)
Chloride: 106 mmol/L (ref 98–111)
Creatinine, Ser: 0.89 mg/dL (ref 0.44–1.00)
GFR, Estimated: >60 mL/min (ref >60)
Glucose, Bld: 93 mg/dL (ref 70–99)
Potassium: 3.5 mmol/L (ref 3.5–5.1)
Sodium: 143 mmol/L (ref 135–145)
Total Bilirubin: 0.6 mg/dL (ref <1.2)
Total Protein: 6 g/dL — ABNORMAL LOW (ref 6.5–8.1)

## 2023-06-16 LAB — PROTIME-INR
INR: 1.2 (ref 0.8–1.2)
Prothrombin Time: 15.1 s (ref 11.4–15.2)

## 2023-06-16 LAB — SURGICAL PCR SCREEN
MRSA, PCR: NEGATIVE
Staphylococcus aureus: NEGATIVE

## 2023-06-16 LAB — HEPARIN LEVEL (UNFRACTIONATED)
Heparin Unfractionated: 0.15 [IU]/mL — ABNORMAL LOW (ref 0.30–0.70)
Heparin Unfractionated: 0.74 [IU]/mL — ABNORMAL HIGH (ref 0.30–0.70)

## 2023-06-16 MED ORDER — METHOCARBAMOL 500 MG PO TABS
500.0000 mg | ORAL_TABLET | Freq: Three times a day (TID) | ORAL | Status: DC
  Administered 2023-06-16 – 2023-06-23 (×21): 500 mg via ORAL
  Filled 2023-06-16 (×21): qty 1

## 2023-06-16 MED ORDER — FENTANYL CITRATE PF 50 MCG/ML IJ SOSY: 25.0000 ug | PREFILLED_SYRINGE | INTRAMUSCULAR | Status: DC | PRN

## 2023-06-16 MED ORDER — KETOROLAC TROMETHAMINE 30 MG/ML IJ SOLN
30.0000 mg | Freq: Three times a day (TID) | INTRAMUSCULAR | Status: AC
  Administered 2023-06-16 – 2023-06-21 (×15): 30 mg via INTRAVENOUS
  Filled 2023-06-16 (×15): qty 1

## 2023-06-16 MED ORDER — CEFAZOLIN SODIUM-DEXTROSE 2-4 GM/100ML-% IV SOLN
2.0000 g | INTRAVENOUS | Status: AC
Start: 2023-06-17 — End: 2023-06-18
  Administered 2023-06-17: 2 g via INTRAVENOUS
  Filled 2023-06-16: qty 100

## 2023-06-16 NOTE — Progress Notes (Signed)
PHARMACY - ANTICOAGULATION CONSULT NOTE  Pharmacy Consult for heparin Indication:  Afib and chronic DVT  Labs: Recent Labs    06/14/23 2230 06/15/23 0455 06/15/23 1300 06/15/23 2246  HGB 11.9* 11.9*  --   --   HCT 37.1 36.5  --   --   PLT 113* 83*  --   --   LABPROT 26.0* 26.9* 21.1*  --   INR 2.4* 2.5* 1.8*  --   HEPARINUNFRC  --   --   --  0.22*  CREATININE 0.69 0.66  --   --    Assessment: 67yo female subtherapeutic on heparin with initial dosing while warfarin on hold; no infusion issues or signs of bleeding per RN.  Goal of Therapy:  Heparin level 0.3-0.7 units/ml   Plan:  Increase heparin infusion by 2 units/kg/hr to 900 units/hr. Check level in 6 hours.   Vernard Gambles, PharmD, BCPS 06/16/2023 12:13 AM

## 2023-06-16 NOTE — Progress Notes (Signed)
Physical Therapy Treatment Patient Details Name: [name] MRN: [id number] DOB: Nov 11, 1955 Today's Date: 06/16/2023    History of Present Illness [name] is a 67 y.o. female who presented to emergency department for evaluation for fall at home. X-rays revealed acute displaced periprosthetic distal radial fracture along the proximal aspect of a prior plate and screw fixation, ulnar fx, and acute displaced diagonal fracture of the third digit metacarpal body with possible intra-articular extension to the base.  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    PT Comments  Pt is making steady progress toward goals.  Emphasis on getting L UE to relax against her chest, transitions to EOB with 1 UE assist, safe sit to stands and progression of gait stability, quality of gait and gait stamina.    If plan is discharge home, recommend the following: A little help with bathing/dressing/bathroom;Assistance with cooking/housework;Assist for transportation;Help with stairs or ramp for entrance   Can travel by private vehicle        Equipment Recommendations  None recommended by PT    Recommendations for Other Services       Precautions / Restrictions Precautions Precautions: Fall Required Braces or Orthoses: Splint/Cast;Sling Splint/Cast: LUE sugartong splint in place Restrictions Weight Bearing Restrictions: Yes LUE Weight Bearing: Non weight bearing Other  Position/Activity Restrictions: No specific order written but based on fxs.     Mobility  Bed Mobility Overal bed mobility: Needs Assistance Bed Mobility: Supine to Sit, Sit to Supine     Supine to sit: Contact guard Sit to supine: Contact guard assist        Transfers Overall transfer level: Needs assistance Equipment used: Rolling walker (2 wheels) Transfers: Sit to/from Stand Sit to Stand: Min assist           General transfer comment: needed stability assist once on her feet and assist to come forward more than boost.    Ambulation/Gait Ambulation/Gait assistance: Min assist Gait Distance (Feet): 180 Feet Assistive device: 1 person hand held assist, IV Pole Gait Pattern/deviations: Step-through pattern Gait velocity: slower Gait velocity interpretation: <1.31 ft/sec, indicative of household ambulator   General Gait Details: less antalgic on the L LE, but L LE steps are guarded, often externally rotated and pt occasionally listing posteriorly off the L foot.   Stairs             Wheelchair Mobility     Tilt Bed    Modified Rankin (Stroke Patients Only)       Balance Overall balance assessment: Needs assistance Sitting-balance support: Single extremity supported Sitting balance-Leahy Scale: Fair     Standing balance support: No upper extremity supported, Single extremity supported, During functional activity Standing balance-Leahy Scale: Fair Standing balance comment: more stable this afternoon evaluation time.  pt does report some dizziness.  pt had several episodes of posterior bias from dizziness, pain or fatigue                            Cognition Arousal: Alert Behavior During Therapy: WFL for tasks assessed/performed Overall Cognitive Status:  Within Functional Limits for tasks assessed                                          Exercises      General Comments General comments (skin integrity, edema,  etc.): No spnning sensation with her dizziness today.      Pertinent Vitals/Pain Pain Assessment Pain Assessment: Faces Faces Pain Scale: Hurts even more Pain Location: left arm and left leg Pain Descriptors / Indicators: Discomfort Pain Intervention(s): Monitored during session    Home Living Family/patient expects to be discharged to:: Private residence Living Arrangements: Children Available Help at Discharge: Available 24 hours/day Type of Home: House Home Access: Stairs to enter Entrance Stairs-Rails: None Entrance Stairs-Number of Steps: unknown number of steps, unknown rails. Alternate Level Stairs-Number of Steps: flight, but states stair lift. Home Layout: Two level Home Equipment: Agricultural consultant (2 wheels);Shower seat Additional Comments: Pt has a stair lift and a shower seat    Prior Function            PT Goals (current goals can now be found in the care plan section) Acute Rehab PT Goals Patient Stated Goal: back to my PLOF PT Goal Formulation: With patient Time For Goal Achievement: 06/22/23 Potential to Achieve Goals: Good Progress towards PT goals: Progressing toward goals    Frequency    Min 1X/week      PT Plan      Co-evaluation              AM-PAC PT "6 Clicks" Mobility   Outcome Measure  Help needed turning from your back to your side while in a flat bed without using bedrails?: A Little Help needed moving from lying on your back to sitting on the side of a flat bed without using bedrails?: A Little Help needed moving to and from a bed to a chair (including a wheelchair)?: A Little Help needed standing up from a chair using your arms (e.g., wheelchair or bedside chair)?: A Little Help needed to walk in hospital room?: A Little Help needed climbing 3-5 steps with a railing? : A Little 6 Click Score: 18    End of Session   Activity Tolerance: Patient tolerated treatment well;Patient limited by pain (minor dizziness) Patient left:  in bed;with call bell/phone within reach Nurse Communication: Mobility status PT Visit Diagnosis: Unsteadiness on feet (R26.81);Other abnormalities of gait and mobility (R26.89);Pain Pain - Right/Left: Left Pain - part of body: Hand;Hip     Time: 1610-9604 PT Time Calculation (min) (ACUTE ONLY): 30 min  Charges:    $Gait Training: 8-22 mins $Therapeutic Activity: 8-22 mins PT General Charges $$ ACUTE PT VISIT: 1 Visit                     06/16/2023  Jacinto Halim., PT Acute Rehabilitation Services [phone]  (office)   Eliseo Gum Temesha Queener 06/16/2023, 2:16 PM

## 2023-06-16 NOTE — Progress Notes (Signed)
Patient complained pain and tightness under the cast. Dr Melvyn Novas contacted over the phone. Ace wrap removed and re wrapped at 1645 PM per MD.  Will continue to monitor.

## 2023-06-16 NOTE — Plan of Care (Signed)
  Problem: Education: Goal: Knowledge of General Education information will improve Description: Including pain rating scale, medication(s)/side effects and non-pharmacologic comfort measures Outcome: Progressing   Problem: Clinical Measurements: Goal: Respiratory complications will improve Outcome: Progressing   Problem: Nutrition: Goal: Adequate nutrition will be maintained Outcome: Progressing   Problem: Coping: Goal: Level of anxiety will decrease Outcome: Progressing   Problem: Elimination: Goal: Will not experience complications related to urinary retention Outcome: Progressing   Problem: Safety: Goal: Ability to remain free from injury will improve Outcome: Progressing   Problem: Skin Integrity: Goal: Risk for impaired skin integrity will decrease Outcome: Progressing
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
## 2023-06-16 NOTE — Progress Notes (Signed)
PHARMACY - ANTICOAGULATION CONSULT NOTE  Pharmacy Consult for heparin Indication:  Afib and chronic DVT  Labs: Recent Labs    06/14/23 2230 06/15/23 0455 06/15/23 1300 06/15/23 2246 06/16/23 0638  HGB 11.9* 11.9*  --   --  13.4  HCT 37.1 36.5  --   --  40.1  PLT 113* 83*  --   --  57*  LABPROT 26.0* 26.9* 21.1*  --  15.1  INR 2.4* 2.5* 1.8*  --  1.2  HEPARINUNFRC  --   --   --  0.22* 0.15*  CREATININE 0.69 0.66  --   --   --    Assessment: 67yo female subtherapeutic on heparin while warfarin on hold; no infusion issues or signs of bleeding per RN.  Goal of Therapy:  Heparin level 0.3-0.7 units/ml   Plan:  Increase heparin infusion to 1100 units / hr Check level in 6 hours.   Okey Regal, PharmD 06/16/2023 8:26 AM

## 2023-06-16 NOTE — Progress Notes (Signed)
PHARMACY - ANTICOAGULATION CONSULT NOTE  Pharmacy Consult for heparin Indication: atrial fibrillation and chronic lower extremity DVT  Allergies  Allergen Reactions   Zonegran [Zonisamide] Other (See Comments)    Numbness and tingling over whole body   Chocolate Other (See Comments)    Triggers seizures   Codeine Nausea And Vomiting   Keppra [Levetiracetam] Other (See Comments)    Causes seizures   Lyrica [Pregabalin] Nausea And Vomiting   Olive Oil Rash    Patient Measurements: Height: 5' 6.5" (168.9 cm) Weight: 58 kg (127 lb 12.8 oz) IBW/kg (Calculated) : 60.45 Heparin Dosing Weight: 58 Kg  Vital Signs: Temp: 98.4 F (36.9 C) (11/12 1532) Temp Source: Oral (11/12 1532) BP: 106/71 (11/12 1532) Pulse Rate: 89 (11/12 1532)  Labs: Recent Labs    06/14/23 2230 06/15/23 0455 06/15/23 1300 06/15/23 2246 06/16/23 0638 06/16/23 0824 06/16/23 1619  HGB 11.9* 11.9*  --   --  13.4  --   --   HCT 37.1 36.5  --   --  40.1  --   --   PLT 113* 83*  --   --  57*  --   --   LABPROT 26.0* 26.9* 21.1*  --  15.1  --   --   INR 2.4* 2.5* 1.8*  --  1.2  --   --   HEPARINUNFRC  --   --   --  0.22* 0.15*  --  0.74*  CREATININE 0.69 0.66  --   --   --  0.89  --     Estimated Creatinine Clearance: 57.7 mL/min (by C-G formula based on SCr of 0.89 mg/dL).   Medical History: Past Medical History:  Diagnosis Date   Acute bronchitis 08/06/2022   Acute respiratory failure with hypoxia (HCC) 03/31/2015   Altered mental status    Asthma    Ataxia 08/26/2019   Atypical chest pain 02/17/2017   Cerebral hemorrhage (HCC) 1989   Chronic back pain    Closed fracture of distal end of left radius 07/17/2022   Closed fracture of fifth metatarsal bone 01/04/2019   Closed fracture of proximal phalanx of great toe 01/04/2019   Closed nondisplaced fracture of styloid process of left radius 08/06/2022   COPD (chronic obstructive pulmonary disease) (HCC)    no meds per daughter   COPD with  exacerbation (HCC) 03/30/2015   Costochondritis 02/17/2017   COVID-19 virus infection 04/02/2020   DDD (degenerative disc disease) 06/12/2013   DVT (deep venous thrombosis) (HCC)    "she's had several since 1990"   GERD (gastroesophageal reflux disease)    no meds per daughter   Headache    "usually around time when she's had a seizure"   History of blood transfusion    "when she was a baby"   History of kidney stones    History of stomach ulcers    Hypoxia 01/18/2015   Lactic acidosis 04/02/2020   Neuropathy    Seizures (HCC)    "because of her brain surgery"  last one 07/28/22   Sepsis secondary to UTI (HCC) 01/17/2015   Stroke [facility]) 1990's   family denies residual on 11/09/2014   Tunnel vision    "since brain OR"    Assessment: 34 yoF here with fall on warfarin PTA for atrial fibrillation and history of DVT. INR 2.5 on arrival and vitamin K administered for possible emergent surgery. Repeat INR down to 1.8 and surgery has been tentatively scheduled for Wednesday 11/13. Heparin will be initiated  per pharmacy consult for bridge therapy.   Heparin level slightly supratherapeutic at 0.74. no bleeding noted and no issues with heparin infusing per d/w RN.  Goal of Therapy:  Heparin level 0.3-0.7 units/ml Monitor platelets by anticoagulation protocol: Yes   Plan:  Reduce heparin drip slightly to 1050 units/hr Check heparin level in 6 hours   Rexford Maus, PharmD, BCPS 06/16/2023 5:48 PM

## 2023-06-17 ENCOUNTER — Encounter (HOSPITAL_COMMUNITY): Payer: Self-pay | Admitting: Internal Medicine

## 2023-06-17 ENCOUNTER — Other Ambulatory Visit: Payer: Self-pay

## 2023-06-17 ENCOUNTER — Inpatient Hospital Stay (HOSPITAL_COMMUNITY): Payer: Medicare HMO | Admitting: Registered Nurse

## 2023-06-17 ENCOUNTER — Encounter (HOSPITAL_COMMUNITY)
Admission: EM | Disposition: A | Discharge status: Home / Self Care | Payer: Self-pay | Source: Home / Self Care | Attending: Internal Medicine

## 2023-06-17 DIAGNOSIS — S52502D Unspecified fracture of the lower end of left radius, subsequent encounter for closed fracture with routine healing: Secondary | ICD-10-CM

## 2023-06-17 DIAGNOSIS — S52502A Unspecified fracture of the lower end of left radius, initial encounter for closed fracture: Secondary | ICD-10-CM

## 2023-06-17 HISTORY — PX: OPEN REDUCTION INTERNAL FIXATION (ORIF) DISTAL RADIAL FRACTURE: SHX5989

## 2023-06-17 LAB — CBC
HCT: 35.3 % — ABNORMAL LOW (ref 36.0–46.0)
Hemoglobin: 11.9 g/dL — ABNORMAL LOW (ref 12.0–15.0)
MCH: 33.9 pg (ref 26.0–34.0)
MCHC: 33.7 g/dL (ref 30.0–36.0)
MCV: 100.6 fL — ABNORMAL HIGH (ref 80.0–100.0)
Platelets: 94 10*3/uL — ABNORMAL LOW (ref 150–400)
RBC: 3.51 MIL/uL — ABNORMAL LOW (ref 3.87–5.11)
RDW: 12.8 % (ref 11.5–15.5)
WBC: 2.9 10*3/uL — ABNORMAL LOW (ref 4.0–10.5)
nRBC: 0 % (ref 0.0–0.2)

## 2023-06-17 LAB — COMPREHENSIVE METABOLIC PANEL
ALT: 12 U/L (ref 0–44)
AST: 17 U/L (ref 15–41)
Albumin: 2.9 g/dL — ABNORMAL LOW (ref 3.5–5.0)
Alkaline Phosphatase: 47 U/L (ref 38–126)
Anion gap: 8 (ref 5–15)
BUN: 10 mg/dL (ref 8–23)
CO2: 26 mmol/L (ref 22–32)
Calcium: 8.6 mg/dL — ABNORMAL LOW (ref 8.9–10.3)
Chloride: 105 mmol/L (ref 98–111)
Creatinine, Ser: 0.74 mg/dL (ref 0.44–1.00)
GFR, Estimated: >60 mL/min (ref >60)
Glucose, Bld: 85 mg/dL (ref 70–99)
Potassium: 4 mmol/L (ref 3.5–5.1)
Sodium: 139 mmol/L (ref 135–145)
Total Bilirubin: 0.6 mg/dL (ref <1.2)
Total Protein: 5.3 g/dL — ABNORMAL LOW (ref 6.5–8.1)

## 2023-06-17 LAB — HEPARIN LEVEL (UNFRACTIONATED)
Heparin Unfractionated: 0.88 [IU]/mL — ABNORMAL HIGH (ref 0.30–0.70)
Heparin Unfractionated: 0.69 [IU]/mL (ref 0.30–0.70)

## 2023-06-17 LAB — PROTIME-INR
INR: 1.1 (ref 0.8–1.2)
Prothrombin Time: 14 s (ref 11.4–15.2)

## 2023-06-17 SURGERY — OPEN REDUCTION INTERNAL FIXATION (ORIF) DISTAL RADIUS FRACTURE
Anesthesia: General | Site: Hand | Laterality: Left

## 2023-06-17 MED ORDER — 0.9 % SODIUM CHLORIDE (POUR BTL) OPTIME
TOPICAL | Status: DC | PRN
  Administered 2023-06-17: 1000 mL

## 2023-06-17 MED ORDER — HYDROMORPHONE HCL 1 MG/ML IJ SOLN
1.0000 mg | Freq: Once | INTRAMUSCULAR | Status: AC
  Administered 2023-06-17: 1 mg via INTRAVENOUS
  Filled 2023-06-17: qty 1

## 2023-06-17 MED ORDER — DEXAMETHASONE SODIUM PHOSPHATE 10 MG/ML IJ SOLN
INTRAMUSCULAR | Status: AC
  Filled 2023-06-17: qty 1

## 2023-06-17 MED ORDER — CHLORHEXIDINE GLUCONATE 0.12 % MT SOLN: 15.0000 mL | Freq: Once | OROMUCOSAL | Status: AC

## 2023-06-17 MED ORDER — ORAL CARE MOUTH RINSE: 15.0000 mL | Freq: Once | OROMUCOSAL | Status: AC

## 2023-06-17 MED ORDER — LACTATED RINGERS IV SOLN: INTRAVENOUS | Status: DC

## 2023-06-17 MED ORDER — ACETAMINOPHEN 10 MG/ML IV SOLN
1000.0000 mg | Freq: Once | INTRAVENOUS | Status: AC
  Administered 2023-06-17: 1000 mg via INTRAVENOUS

## 2023-06-17 MED ORDER — OXYCODONE HCL 5 MG/5ML PO SOLN: 5.0000 mg | Freq: Once | ORAL | Status: DC | PRN

## 2023-06-17 MED ORDER — FENTANYL CITRATE (PF) 100 MCG/2ML IJ SOLN
25.0000 ug | INTRAMUSCULAR | Status: DC | PRN
  Administered 2023-06-17: 50 ug via INTRAVENOUS

## 2023-06-17 MED ORDER — FENTANYL CITRATE (PF) 250 MCG/5ML IJ SOLN
INTRAMUSCULAR | Status: DC | PRN
  Administered 2023-06-17 (×2): 50 ug via INTRAVENOUS
  Administered 2023-06-17: 25 ug via INTRAVENOUS
  Administered 2023-06-17: 50 ug via INTRAVENOUS
  Administered 2023-06-17 (×3): 25 ug via INTRAVENOUS

## 2023-06-17 MED ORDER — ACETAMINOPHEN 10 MG/ML IV SOLN
INTRAVENOUS | Status: AC
  Filled 2023-06-17: qty 100

## 2023-06-17 MED ORDER — DEXAMETHASONE SODIUM PHOSPHATE 10 MG/ML IJ SOLN
INTRAMUSCULAR | Status: DC | PRN
  Administered 2023-06-17: 5 mg via INTRAVENOUS

## 2023-06-17 MED ORDER — FENTANYL CITRATE (PF) 100 MCG/2ML IJ SOLN
INTRAMUSCULAR | Status: AC
  Filled 2023-06-17: qty 2

## 2023-06-17 MED ORDER — CHLORHEXIDINE GLUCONATE 0.12 % MT SOLN
OROMUCOSAL | Status: AC
  Administered 2023-06-17: 15 mL via OROMUCOSAL
  Filled 2023-06-17: qty 15

## 2023-06-17 MED ORDER — BUPIVACAINE HCL (PF) 0.25 % IJ SOLN
INTRAMUSCULAR | Status: AC
  Filled 2023-06-17: qty 30

## 2023-06-17 MED ORDER — LIDOCAINE 2% (20 MG/ML) 5 ML SYRINGE
INTRAMUSCULAR | Status: AC
Start: 2023-06-17 — End: ?
  Filled 2023-06-17: qty 5

## 2023-06-17 MED ORDER — PROPOFOL 10 MG/ML IV BOLUS
INTRAVENOUS | Status: DC | PRN
  Administered 2023-06-17: 120 mg via INTRAVENOUS

## 2023-06-17 MED ORDER — HYDROMORPHONE HCL 1 MG/ML IJ SOLN
0.5000 mg | INTRAMUSCULAR | Status: AC | PRN
  Filled 2023-06-17: qty 1

## 2023-06-17 MED ORDER — ONDANSETRON HCL 4 MG/2ML IJ SOLN
INTRAMUSCULAR | Status: AC
Start: 2023-06-17 — End: ?
  Filled 2023-06-17: qty 2

## 2023-06-17 MED ORDER — OXYCODONE HCL 5 MG PO TABS: 5.0000 mg | ORAL_TABLET | Freq: Once | ORAL | Status: DC | PRN

## 2023-06-17 MED ORDER — KETOROLAC TROMETHAMINE 15 MG/ML IJ SOLN
INTRAMUSCULAR | Status: AC
  Filled 2023-06-17: qty 1

## 2023-06-17 MED ORDER — ONDANSETRON HCL 4 MG/2ML IJ SOLN: 4.0000 mg | Freq: Once | INTRAMUSCULAR | Status: DC | PRN

## 2023-06-17 MED ORDER — ONDANSETRON HCL 4 MG/2ML IJ SOLN
INTRAMUSCULAR | Status: DC | PRN
  Administered 2023-06-17: 4 mg via INTRAVENOUS

## 2023-06-17 MED ORDER — PHENYLEPHRINE 80 MCG/ML (10ML) SYRINGE FOR IV PUSH (FOR BLOOD PRESSURE SUPPORT)
PREFILLED_SYRINGE | INTRAVENOUS | Status: DC | PRN
  Administered 2023-06-17: 160 ug via INTRAVENOUS

## 2023-06-17 MED ORDER — BUPIVACAINE HCL (PF) 0.25 % IJ SOLN
INTRAMUSCULAR | Status: DC | PRN
  Administered 2023-06-17: 8 mL via INTRA_ARTICULAR

## 2023-06-17 MED ORDER — FENTANYL CITRATE (PF) 250 MCG/5ML IJ SOLN
INTRAMUSCULAR | Status: AC
  Filled 2023-06-17: qty 5

## 2023-06-17 MED ORDER — MIDAZOLAM HCL 2 MG/2ML IJ SOLN
INTRAMUSCULAR | Status: AC
  Filled 2023-06-17: qty 2

## 2023-06-17 MED ORDER — CEFAZOLIN SODIUM-DEXTROSE 2-4 GM/100ML-% IV SOLN
INTRAVENOUS | Status: AC
  Filled 2023-06-17: qty 100

## 2023-06-17 MED ORDER — PHENYLEPHRINE 80 MCG/ML (10ML) SYRINGE FOR IV PUSH (FOR BLOOD PRESSURE SUPPORT)
PREFILLED_SYRINGE | INTRAVENOUS | Status: AC
  Filled 2023-06-17: qty 10

## 2023-06-17 MED ORDER — LIDOCAINE 2% (20 MG/ML) 5 ML SYRINGE
INTRAMUSCULAR | Status: DC | PRN
  Administered 2023-06-17: 40 mg via INTRAVENOUS

## 2023-06-17 MED ORDER — KETOROLAC TROMETHAMINE 15 MG/ML IJ SOLN
15.0000 mg | Freq: Once | INTRAMUSCULAR | Status: AC
  Administered 2023-06-17: 15 mg via INTRAVENOUS

## 2023-06-17 SURGICAL SUPPLY — 68 items
2.7mm Cannulated Drill ×1
BAG COUNTER SPONGE SURGICOUNT (BAG) ×1 IMPLANT
BLADE CLIPPER SURG (BLADE) IMPLANT
BNDG ELASTIC 3INX 5YD STR LF (GAUZE/BANDAGES/DRESSINGS) ×1 IMPLANT
BNDG ELASTIC 4INX 5YD STR LF (GAUZE/BANDAGES/DRESSINGS) IMPLANT
BNDG ELASTIC 4X5.8 VLCR STR LF (GAUZE/BANDAGES/DRESSINGS) ×1 IMPLANT
BNDG ESMARK 4X9 LF (GAUZE/BANDAGES/DRESSINGS) ×1 IMPLANT
BNDG GAUZE DERMACEA FLUFF 4 (GAUZE/BANDAGES/DRESSINGS) ×1 IMPLANT
CANISTER SUCT 3000ML PPV (MISCELLANEOUS) ×1 IMPLANT
CORD BIPOLAR FORCEPS 12FT (ELECTRODE) ×1 IMPLANT
COVER SURGICAL LIGHT HANDLE (MISCELLANEOUS) ×1 IMPLANT
CUFF TOURN SGL QUICK 18X4 (TOURNIQUET CUFF) ×1 IMPLANT
CUFF TOURN SGL QUICK 24 (TOURNIQUET CUFF)
CUFF TRNQT CYL 24X4X16.5-23 (TOURNIQUET CUFF) IMPLANT
DRAPE OEC MINIVIEW 54X84 (DRAPES) ×1 IMPLANT
DRAPE SURG 17X11 SM STRL (DRAPES) ×1 IMPLANT
DRILL CANN ALPS MVX 2.7 ZI (DRILL) IMPLANT
DRSG ADAPTIC 3X8 NADH LF (GAUZE/BANDAGES/DRESSINGS) ×1 IMPLANT
GAUZE 4X4 16PLY ~~LOC~~+RFID DBL (SPONGE) ×1 IMPLANT
GAUZE SPONGE 4X4 12PLY STRL (GAUZE/BANDAGES/DRESSINGS) ×1 IMPLANT
GAUZE XEROFORM 5X9 LF (GAUZE/BANDAGES/DRESSINGS) ×1 IMPLANT
GLOVE BIOGEL PI IND STRL 8.5 (GLOVE) ×1 IMPLANT
GLOVE SURG ORTHO 8.0 STRL STRW (GLOVE) ×1 IMPLANT
GOWN STRL REUS W/ TWL LRG LVL3 (GOWN DISPOSABLE) ×1 IMPLANT
GOWN STRL REUS W/ TWL XL LVL3 (GOWN DISPOSABLE) ×1 IMPLANT
GOWN STRL REUS W/TWL LRG LVL3 (GOWN DISPOSABLE) ×1
GOWN STRL REUS W/TWL XL LVL3 (GOWN DISPOSABLE) ×1
K-WIRE ALPS MXV 1.1X6 ZI (WIRE) ×2
KIT BASIN OR (CUSTOM PROCEDURE TRAY) ×1 IMPLANT
KIT TURNOVER KIT B (KITS) ×1 IMPLANT
KWIRE ALPS MXV 1.1X6 ZI (WIRE) IMPLANT
NAIL ×1 IMPLANT
NAIL IM ALPS MVX 3.5X55 (Nail) IMPLANT
NAIL---NO CHARGE FOR THIS ITEM PER AMANDA SKEEN ×1 IMPLANT
NDL HYPO 25X1 1.5 SAFETY (NEEDLE) ×1 IMPLANT
NEEDLE HYPO 25X1 1.5 SAFETY (NEEDLE) ×1 IMPLANT
NS IRRIG 1000ML POUR BTL (IV SOLUTION) ×1 IMPLANT
PACK ORTHO EXTREMITY (CUSTOM PROCEDURE TRAY) ×1 IMPLANT
PAD ARMBOARD 7.5X6 YLW CONV (MISCELLANEOUS) ×2 IMPLANT
PAD CAST 4YDX4 CTTN HI CHSV (CAST SUPPLIES) ×1 IMPLANT
PADDING CAST COTTON 4X4 STRL (CAST SUPPLIES) ×1
PLATE LONG DVR LEFT (Plate) IMPLANT
SCREW LOCK 10X2.7X3 LD THRD (Screw) IMPLANT
SCREW LOCK 14X2.7X 3 LD TPR (Screw) IMPLANT
SCREW LOCK 16X2.7X 3 LD TPR (Screw) IMPLANT
SCREW LOCKING 2.7X10MM (Screw) ×1 IMPLANT
SCREW LOCKING 2.7X13MM (Screw) IMPLANT
SCREW LOCKING 2.7X14 (Screw) ×4 IMPLANT
SCREW LOCKING 2.7X16 (Screw) ×1 IMPLANT
SCREW LOCKING 2.7X8MM (Screw) IMPLANT
SOAP 2 % CHG 4 OZ (WOUND CARE) ×1 IMPLANT
SPIKE FLUID TRANSFER (MISCELLANEOUS) ×1 IMPLANT
SPLINT FIBERGLASS 3X35 (CAST SUPPLIES) IMPLANT
SPONGE T-LAP 4X18 ~~LOC~~+RFID (SPONGE) ×1 IMPLANT
SUT ETHIBOND 4 0 TF (SUTURE) IMPLANT
SUT MNCRL AB 3-0 PS2 18 (SUTURE) IMPLANT
SUT PROLENE 3 0 PS 2 (SUTURE) IMPLANT
SUT PROLENE 4 0 PS 2 18 (SUTURE) IMPLANT
SUT VIC AB 2-0 CT1 27 (SUTURE)
SUT VIC AB 2-0 CT1 TAPERPNT 27 (SUTURE) IMPLANT
SUT VIC AB 2-0 CT2 27 (SUTURE) IMPLANT
SUT VICRYL 4-0 PS2 18IN ABS (SUTURE) IMPLANT
SYR CONTROL 10ML LL (SYRINGE) IMPLANT
TOWEL GREEN STERILE (TOWEL DISPOSABLE) ×1 IMPLANT
TOWEL GREEN STERILE FF (TOWEL DISPOSABLE) IMPLANT
TUBE CONNECTING 12X1/4 (SUCTIONS) ×1 IMPLANT
WATER STERILE IRR 1000ML POUR (IV SOLUTION) ×1 IMPLANT
YANKAUER SUCT BULB TIP NO VENT (SUCTIONS) IMPLANT

## 2023-06-17 NOTE — Progress Notes (Addendum)
S/p fall at home, suffered acute displaced periprosthetic distal radial fracture    06/17/23 1434  TOC Brief Assessment  Insurance and Status Reviewed  Patient has primary care physician Yes  Home environment has been reviewed Resides with daughter.  Prior level of function: PTA independent with ADL'.s. Owns RW.  Social Determinants of Health Reivew SDOH reviewed no interventions necessary  Readmission risk has been reviewed No  Transition of care needs no transition of care needs at this time   Plan: Hand surgery/orthopedics consulted and surgery scheduled this afternoon.   11/13 - S/p ORIF left radius and 3rd MC w/hardware removal    TOC team following and will assist with needs.
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
## 2023-06-17 NOTE — Progress Notes (Signed)
Lab here to draw heparin labs. Labs taken from the opposite side without heparin infusing. No signs of bleeding noted. Pt is alert and oriented, still complains of pain LUE.

## 2023-06-17 NOTE — Progress Notes (Signed)
Transition of Care [facility]) - CAGE-AID Screening   Patient Details  Name: [name] MRN: [id number] Date of Birth: 1955/08/23  [facility]) CM/SW Contact:    Leota Sauers, RN Phone Number: 06/17/2023, 9:47 PM   Clinical Narrative:  Patient denies use of alcohol and illicit substances. Resources not given at this time.  CAGE-AID Screening:    Have You Ever Felt You Ought to Cut Down on Your Drinking or Drug Use?: No Have People Annoyed You By Critizing Your Drinking Or Drug Use?: No Have You Felt Bad Or Guilty About Your Drinking Or Drug Use?: No      Substance Abuse Education Offered: No

## 2023-06-17 NOTE — Transfer of Care (Signed)
Immediate Anesthesia Transfer of Care Note  Patient: [name]  Procedure(s) Performed: OPEN REDUCTION INTERNAL FIXATION (ORIF) DISTAL RADIUS FRACTURE (Left: Hand)  Patient Location: PACU  Anesthesia Type:General  Level of Consciousness: awake and drowsy  Airway & Oxygen Therapy: Patient Spontanous Breathing and Patient connected to face mask oxygen  Post-op Assessment: Report given to RN, Post -op Vital signs reviewed and stable, and Patient moving all extremities X 4  Post vital signs: Reviewed and stable  Last Vitals:  Vitals Value Taken Time  BP 123/85 06/17/23 2030  Temp 36.4 C 06/17/23 2027  Pulse 105 06/17/23 2033  Resp 14 06/17/23 2033  SpO2 95 % 06/17/23 2033  Vitals shown include unfiled device data.  Last Pain:  Vitals:   06/17/23 1555  TempSrc: Oral  PainSc: 10-Worst pain ever         Complications: No notable events documented.

## 2023-06-17 NOTE — Progress Notes (Signed)
Occupational Therapy Treatment Patient Details Name: [name] MRN: [id number] DOB: [birth year] Today's Date: 06/17/2023   History of present illness [name] is a 67 y.o. female who presented to emergency department for evaluation for fall at home. X-rays revealed acute displaced periprosthetic distal radial fracture along the proximal aspect of a prior plate and screw fixation, ulnar fx, and acute displaced diagonal fracture of the third digit metacarpal body with possible intra-articular extension to the base.  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   OT comments  Pt with increased pain in the LUE at rest, which improved with repositioning on pillows and with re-wrapping ace bandages over ortho glass splint material.  Note some increased tightness in splint at upper forearm.  Would recommend re-application of splint if pt does not go to OR this afternoon for ORIF on the LUE.  Still at min assist level for functional transfers and LB dressing.  Will continue to benefit from acute care OT to help increase independence for return home with family that can provide 24 hour supervision.        If plan is discharge home, recommend the following:  A little help with walking and/or transfers;A lot of help with bathing/dressing/bathroom;Help with stairs or ramp for entrance;Assist for transportation;Assistance with cooking/housework   Equipment Recommendations  None  recommended by OT       Precautions / Restrictions Precautions Precautions: Fall Required Braces or Orthoses: Splint/Cast;Sling Splint/Cast: LUE sugartong splint in place Restrictions Weight Bearing Restrictions: Yes LUE Weight Bearing: Non weight bearing       Mobility Bed Mobility Overal bed mobility: Needs Assistance Bed Mobility: Supine to Sit, Sit to Supine     Supine to sit: Contact guard, HOB elevated          Transfers Overall transfer level: Needs assistance Equipment used: None Transfers: Sit to/from Stand Sit to Stand: Min assist     Step pivot transfers: Min assist           Balance Overall balance assessment: Needs assistance Sitting-balance support: Single extremity supported Sitting balance-Leahy Scale: Fair     Standing balance support: Single extremity supported, During functional activity Standing balance-Leahy Scale: Poor Standing balance comment: Pt needs therapist assist to maintain standing balance during stand pivot transfers.                           ADL either performed or assessed with clinical judgement   ADL Overall ADL's : Needs assistance/impaired                     Lower Body Dressing: Minimal assistance Lower Body Dressing Details (indicate cue type and reason): min assist for donning gripper socks one handed Toilet Transfer: Minimal assistance;Stand-pivot Toilet Transfer Details (indicate cue type and reason): simulated Toileting- Clothing Manipulation and Hygiene: Moderate assistance;Sit to/from stand Toileting - Clothing Manipulation Details (indicate cue type and reason): simulated     Functional mobility during ADLs: Minimal assistance (stand pivot to recliner) General ADL Comments: Pt with increased pain noted in the LUE.  Positioned UE on pillows at end of session and loosened ace bandaged wrapping and re-applied.  Did not re-apply sling as patient sitting in chair and LUE supported at chest  level and slightly above.  Per chart plan is to undergo left ORIF this afternoon.      Cognition Arousal: Alert Behavior During Therapy: WFL for tasks assessed/performed Overall Cognitive Status: Within Functional Limits for tasks assessed                                                     Pertinent Vitals/ Pain       Pain Assessment Faces Pain Scale: Hurts even more Pain Location: left arm and left leg Pain Descriptors / Indicators: Discomfort Pain Intervention(s): Monitored during session, Repositioned         Frequency  Min 1X/week        Progress Toward Goals  OT Goals(current goals can now be found in the care plan section)  Progress towards OT goals: Progressing toward goals  Acute Rehab OT Goals Patient Stated Goal: Pt wants her arm pain to improve OT Goal Formulation: With patient/family Time For Goal Achievement: 06/29/23 Potential to Achieve Goals: Good  Plan         AM-PAC OT "6 Clicks" Daily Activity     Outcome Measure   Help from another person eating meals?: A Little Help from another person taking care of personal grooming?: A Little Help from another person toileting, which includes using toliet, bedpan, or urinal?: A Lot   Help from another person to put on and taking off regular upper body clothing?: A Lot Help from another person to put on and taking off regular lower body clothing?: A Lot 6 Click Score: 12    End of Session    OT Visit Diagnosis: Unsteadiness on feet (R26.81);Other abnormalities of gait and mobility (R26.89);Repeated falls (R29.6);Pain Pain - Right/Left: Left Pain - part of body: Arm   Activity Tolerance Patient limited by pain   Patient Left in chair;with chair alarm set;with family/visitor present   Nurse Communication Mobility status;Other (comment) (request for pain meds)        Time: 5284-1324 OT Time Calculation (min): 30 min  Charges: OT General Charges $OT Visit: 1 Visit OT  Treatments $Self Care/Home Management : 23-37 mins  Perrin Maltese, OTR/L Acute Rehabilitation Services  Office [phone] 06/17/2023

## 2023-06-17 NOTE — Anesthesia Preprocedure Evaluation (Signed)
Anesthesia Evaluation  Patient identified by MRN, date of birth, ID band Patient awake    Reviewed: Allergy & Precautions, NPO status , Patient's Chart, lab work & pertinent test results  History of Anesthesia Complications Negative for: history of anesthetic complications  Airway Mallampati: II  TM Distance: >3 FB Neck ROM: Full    Dental  (+) Dental Advisory Given   Pulmonary asthma , COPD, former smoker   Pulmonary exam normal        Cardiovascular + DVT  Normal cardiovascular exam+ dysrhythmias Atrial Fibrillation      Neuro/Psych  Headaches, Seizures -, Poorly Controlled,  PSYCHIATRIC DISORDERS Anxiety      Hx cerebral hemorrhage   Neuromuscular disease CVA    GI/Hepatic Neg liver ROS,GERD  Medicated and Controlled,,  Endo/Other  negative endocrine ROS    Renal/GU negative Renal ROS  Female GU complaint     Musculoskeletal  (+) Arthritis ,    Abdominal   Peds  Hematology  (+) Blood dyscrasia, anemia  On coumadin, INR 1.1 Plt 94k WBC 2.9k    Anesthesia Other Findings   Reproductive/Obstetrics  Breast cancer                              Anesthesia Physical Anesthesia Plan  ASA: 3  Anesthesia Plan: General   Post-op Pain Management: Tylenol PO (pre-op)*   Induction: Intravenous  PONV Risk Score and Plan: 2 and Ondansetron, Treatment may vary due to age or medical condition and Dexamethasone  Airway Management Planned: LMA  Additional Equipment: None  Intra-op Plan:   Post-operative Plan: Extubation in OR  Informed Consent: I have reviewed the patients History and Physical, chart, labs and discussed the procedure including the risks, benefits and alternatives for the proposed anesthesia with the patient or authorized representative who has indicated his/her understanding and acceptance.     Dental advisory given  Plan Discussed with: Anesthesiologist and  CRNA  Anesthesia Plan Comments:         Anesthesia Quick Evaluation

## 2023-06-17 NOTE — Plan of Care (Signed)
  Problem: Pain Management: Goal: General experience of comfort will improve Outcome: Progressing   Problem: Safety: Goal: Ability to remain free from injury will improve Outcome: Progressing   Problem: Skin Integrity: Goal: Risk for impaired skin integrity will decrease Outcome: Progressing

## 2023-06-17 NOTE — Progress Notes (Signed)
MD called order was in for patient to receive 1g dose of Ancef in PACU Now.  Patient had received 2mg  dose of Ancef in OR.  MD doesn't want PACU to administer dose OK for pharmacy to dose accordingly.

## 2023-06-17 NOTE — Progress Notes (Signed)
PHARMACY - ANTICOAGULATION CONSULT NOTE  Pharmacy Consult for heparin Indication: atrial fibrillation and chronic lower extremity DVT  Patient Measurements: Height: 5' 6.5" (168.9 cm) Weight: 58 kg (127 lb 12.8 oz) IBW/kg (Calculated) : 60.45 Heparin Dosing Weight: 58 Kg  Vital Signs: Temp: 98.3 F (36.8 C) (11/12 2047) Temp Source: Oral (11/12 2047) BP: 95/56 (11/12 2047) Pulse Rate: 70 (11/12 2047)  Labs: Recent Labs    06/14/23 2230 06/15/23 0455 06/15/23 1300 06/15/23 2246 06/16/23 0638 06/16/23 0824 06/16/23 1619 06/17/23 0037  HGB 11.9* 11.9*  --   --  13.4  --   --   --   HCT 37.1 36.5  --   --  40.1  --   --   --   PLT 113* 83*  --   --  57*  --   --   --   LABPROT 26.0* 26.9* 21.1*  --  15.1  --   --   --   INR 2.4* 2.5* 1.8*  --  1.2  --   --   --   HEPARINUNFRC  --   --   --    < > 0.15*  --  0.74* 0.88*  CREATININE 0.69 0.66  --   --   --  0.89  --   --    < > = values in this interval not displayed.    Estimated Creatinine Clearance: 57.7 mL/min (by C-G formula based on SCr of 0.89 mg/dL).   Medical History: Past Medical History:  Diagnosis Date   Acute bronchitis 08/06/2022   Acute respiratory failure with hypoxia (HCC) 03/31/2015   Altered mental status    Asthma    Ataxia 08/26/2019   Atypical chest pain 02/17/2017   Cerebral hemorrhage (HCC) 1989   Chronic back pain    Closed fracture of distal end of left radius 07/17/2022   Closed fracture of fifth metatarsal bone 01/04/2019   Closed fracture of proximal phalanx of great toe 01/04/2019   Closed nondisplaced fracture of styloid process of left radius 08/06/2022   COPD (chronic obstructive pulmonary disease) (HCC)    no meds per daughter   COPD with exacerbation (HCC) 03/30/2015   Costochondritis 02/17/2017   COVID-19 virus infection 04/02/2020   DDD (degenerative disc disease) 06/12/2013   DVT (deep venous thrombosis) (HCC)    "she's had several since 1990"   GERD (gastroesophageal  reflux disease)    no meds per daughter   Headache    "usually around time when she's had a seizure"   History of blood transfusion    "when she was a baby"   History of kidney stones    History of stomach ulcers    Hypoxia 01/18/2015   Lactic acidosis 04/02/2020   Neuropathy    Seizures (HCC)    "because of her brain surgery"  last one 07/28/22   Sepsis secondary to UTI (HCC) 01/17/2015   Stroke [facility]) 1990's   family denies residual on 11/09/2014   Tunnel vision    "since brain OR"    Assessment: 17 yoF here with fall on warfarin PTA for atrial fibrillation and history of DVT. INR 2.5 on arrival and vitamin K administered for possible emergent surgery. Repeat INR down to 1.8 and surgery has been tentatively scheduled for Wednesday 11/13. Heparin will be initiated per pharmacy consult for bridge therapy.   Heparin level supra-therapeutic at 0.74 > 0.88 after dose reduction from 1100 units/hr to 1050 units/hr. No bleeding noted and no  issues with heparin infusing per d/w RN. Last CBC shows Hgb 13.4 and plts 83>57. Heparin level drawn appropriately.  Goal of Therapy:  Heparin level 0.3-0.7 units/ml Monitor platelets by anticoagulation protocol: Yes   Plan:  Reduce heparin drip to 950 units/hr Check heparin level in 6 hours  CBC and heparin level daily  Arabella Merles, PharmD. Clinical Pharmacist 06/17/2023 1:10 AM

## 2023-06-17 NOTE — Progress Notes (Signed)
PHARMACY - ANTICOAGULATION CONSULT NOTE  Pharmacy Consult for heparin Indication: atrial fibrillation and chronic lower extremity DVT  Patient Measurements: Height: 5' 6.5" (168.9 cm) Weight: 58 kg (127 lb 12.8 oz) IBW/kg (Calculated) : 60.45 Heparin Dosing Weight: 58 Kg  Vital Signs: Temp: 98.1 F (36.7 C) (11/13 0533) Temp Source: Oral (11/13 0533) BP: 107/51 (11/13 0533) Pulse Rate: 76 (11/13 0533)  Labs: Recent Labs    06/15/23 0455 06/15/23 1300 06/15/23 2246 06/16/23 8295 06/16/23 0824 06/16/23 1619 06/17/23 0037 06/17/23 0614  HGB 11.9*  --   --  13.4  --   --   --  11.9*  HCT 36.5  --   --  40.1  --   --   --  35.3*  PLT 83*  --   --  57*  --   --   --  94*  LABPROT 26.9* 21.1*  --  15.1  --   --   --  14.0  INR 2.5* 1.8*  --  1.2  --   --   --  1.1  HEPARINUNFRC  --   --    < > 0.15*  --  0.74* 0.88* 0.69  CREATININE 0.66  --   --   --  0.89  --   --  0.74   < > = values in this interval not displayed.    Estimated Creatinine Clearance: 64.2 mL/min (by C-G formula based on SCr of 0.74 mg/dL).   Medical History: Past Medical History:  Diagnosis Date   Acute bronchitis 08/06/2022   Acute respiratory failure with hypoxia (HCC) 03/31/2015   Altered mental status    Asthma    Ataxia 08/26/2019   Atypical chest pain 02/17/2017   Cerebral hemorrhage (HCC) 1989   Chronic back pain    Closed fracture of distal end of left radius 07/17/2022   Closed fracture of fifth metatarsal bone 01/04/2019   Closed fracture of proximal phalanx of great toe 01/04/2019   Closed nondisplaced fracture of styloid process of left radius 08/06/2022   COPD (chronic obstructive pulmonary disease) (HCC)    no meds per daughter   COPD with exacerbation (HCC) 03/30/2015   Costochondritis 02/17/2017   COVID-19 virus infection 04/02/2020   DDD (degenerative disc disease) 06/12/2013   DVT (deep venous thrombosis) (HCC)    "she's had several since 1990"   GERD (gastroesophageal  reflux disease)    no meds per daughter   Headache    "usually around time when she's had a seizure"   History of blood transfusion    "when she was a baby"   History of kidney stones    History of stomach ulcers    Hypoxia 01/18/2015   Lactic acidosis 04/02/2020   Neuropathy    Seizures (HCC)    "because of her brain surgery"  last one 07/28/22   Sepsis secondary to UTI (HCC) 01/17/2015   Stroke [facility]) 1990's   family denies residual on 11/09/2014   Tunnel vision    "since brain OR"    Assessment: 8 yoF here with fall on warfarin PTA for atrial fibrillation and history of DVT. INR 2.5 on arrival and vitamin K administered for possible emergent surgery. Repeat INR down to 1.8 and surgery has been tentatively scheduled for Wednesday 11/13. Heparin will be initiated per pharmacy consult for bridge therapy.   Heparin level therapeutic   Goal of Therapy:  Heparin level 0.3-0.7 units/ml Monitor platelets by anticoagulation protocol: Yes   Plan:  Heparin at 950 units/hr Follow up after OR  Thank you Okey Regal, PharmD 06/17/2023 8:27 AM

## 2023-06-17 NOTE — Plan of Care (Signed)

## 2023-06-17 NOTE — Anesthesia Procedure Notes (Signed)
Procedure Name: LMA Insertion Date/Time: 06/17/2023 6:35 PM  Performed by: Eulah Pont, CRNAPre-anesthesia Checklist: Patient identified, Emergency Drugs available, Suction available and Patient being monitored Patient Re-evaluated:Patient Re-evaluated prior to induction Oxygen Delivery Method: Circle System Utilized Preoxygenation: Pre-oxygenation with 100% oxygen Induction Type: IV induction Ventilation: Mask ventilation without difficulty LMA: LMA inserted LMA Size: 4.0 Number of attempts: 1 Airway Equipment and Method: Bite block Placement Confirmation: positive ETCO2 Tube secured with: Tape Dental Injury: Teeth and Oropharynx as per pre-operative assessment

## 2023-06-17 NOTE — Op Note (Signed)
PREOPERATIVE DIAGNOSIS: Left long finger metacarpal shaft fracture Left radial shaft fracture with internal fixation  POSTOPERATIVE DIAGNOSIS: Same  ATTENDING SURGEON: Dr. Bradly Bienenstock who scrubbed and present for the entire procedure  ASSISTANT SURGEON: None  ANESTHESIA: General Via LMA  OPERATIVE PROCEDURE: Open treatment of left radial shaft fracture requiring internal fixation Deep implant removal left wrist plate and screw construct Open treatment of left long finger metacarpal shaft fracture requiring internal fixation Radiographs 3 views left hand Radiographs 3 views left wrist Radiographs 3 views left forearm  IMPLANTS: Biomet DVR cross lock extended plate Biomet intramedullary nail 3.5 x 55 mm in length  EBL: Minimal  RADIOGRAPHIC INTERPRETATION: AP lateral and oblique views of the hand do show the intramedullary nail in place with good alignment of the metacarpal shaft  AP and lateral views of the left wrist and forearm do show the volar plate fixation in place with good alignment of the radial shaft and radiocarpal joint.  SURGICAL INDICATIONS: Patient is a right-hand-dominant female who previously undergone open reduction internal fixation of a displaced radius malunion.  Patient done very well.  She had healed she was discharged and unfortunately sustained another fall fracturing just of the proximal extent of the plate.  Patient was seen and evaluated in the hospital and recommend undergo the above procedure.  Risks of surgery include but not limited to bleeding infection damage nearby nerves arteries or tendons nonunion malunion hardware failure loss of motion of the wrist and digits incomplete relief of symptoms and need for further surgical invention.  A signed informed consent was obtained on the day of surgery.  SURGICAL TECHNIQUE: The patient was prepped identified in the preoperative holding area marked apart a marker made on the left long finger and wrist indicate  correct operative site.  The patient brought back the operating placed supine on the anesthesia table where the general anesthetic was administered.  Patient tolerated this well.  A well-padded tourniquet placed on the left brachium and seated with the appropriate drape.  The left upper extremities then prepped and draped normal sterile fashion.  A timeout was called the correct site was identified procedure then began.  Attention was then turned to the left long finger incision made directly over the long finger metacarpal head.  Dissection carried down through the skin and subcutaneous tissue.  The metacarpal head was then carefully isolated through the extensor mechanism and the joint capsule and the guidepin was then placed on the metacarpal shaft.  Following this the appropriate size screw was then chosen.  The appropriate drill bit was then chosen and drilled down the center of the intramedullary canal.  The screw was then seated nicely beneath the subchondral surface of the metacarpal head there was good alignment in all planes.  The wound was then thoroughly irrigated.  Capsule closure was then done with Monocryl extensor interval was then closed with 4-0 Ethibond the skin closed with horizontal mattress Prolene sutures.  Attention was then turned to the left forearm previous incision was then extended proximally.  Dissection carried down through the skin and subcutaneous tissue.  Blunt dissection carried down to the FCR sheath FCR sheath was then opened up and the pronator quadratus was then elevated after carefully protecting the FPL.  The previous internal fixation was then removed.  Deep implant removal of the plate and screw construct was then done.  Following this open reduction was then performed and the longer plate was then applied.  The previous screw holes were  then used distally in the metaphyseal and joint region.  The shaft screws were redrilled and new screws were then placed.  A combination  of locking nonlocking screws placed along the shaft with good purchase proximally and distally.  Open treatment of the radial shaft fracture was completed.  The wound was then thoroughly irrigated.  The pronator quadratus was then closed with 2-0 Vicryl suture.  Subcutaneous tissues closed with 3-0 Monocryl and skin closed with a running 3-0 running Prolene sutures.  Adaptic dressing Xeroform dressings were then applied.  Sterile compressive bandage was applied.  The patient placed in well-padded sugar-tong splint extubated taken recovery in good condition.  10 cc quarter percent percent Marcaine infiltrated local metacarpal block for the hand.  Patient was then extubated taken recovery in good condition.  POSTOPERATIVE PLAN: Admitted admitted back to the internal medicine service.  Ice elevation pain medication.  Nonweightbearing in the left upper extremity.  OT for digital range of motion and edema control.  Follow-up in the office in approxi-10 to 14 days.  Radiographs at the first visit.  Likely transition into a long-arm cast for total of 4 weeks long-arm immobilization.

## 2023-06-17 NOTE — Progress Notes (Addendum)
The patient sustained the fracture just proximal to the previous plate and screw construct.  The plan today is for revision internal fixation and stabilization of the LEFT radial shaft fracture.  We also talked about surgical stabilization and repair of the LEFT ong finger metacarpal fracture.  The risks of surgery include but not limited to bleeding infection damage nearby nerves arteries or tendons nonunion malunion hardware failure need for further surgical invention.  Patient was seen and evaluated with her daughter at the bedside and they voiced understand the reason the rationale for surgical intervention today.  R/B/A DISCUSSED WITH PT IN HOSPITAL  PT VOICED UNDERSTANDING OF PLAN CONSENT SIGNED DAY OF SURGERY PT SEEN AND EXAMINED PRIOR TO OPERATIVE PROCEDURE/DAY OF SURGERY SITE MARKED. QUESTIONS ANSWERED WILL REMAIN AN INPATIENT FOLLOWING SURGERY   WE ARE PLANNING SURGERY FOR YOUR UPPER EXTREMITY. THE RISKS AND BENEFITS OF SURGERY INCLUDE BUT NOT LIMITED TO BLEEDING INFECTION, DAMAGE TO NEARBY NERVES ARTERIES TENDONS, FAILURE OF SURGERY TO ACCOMPLISH ITS INTENDED GOALS, PERSISTENT SYMPTOMS AND NEED FOR FURTHER SURGICAL INTERVENTION. WITH THIS IN MIND WE WILL PROCEED. I HAVE DISCUSSED WITH THE PATIENT THE PRE AND POSTOPERATIVE REGIMEN AND THE DOS AND DON'TS. PT VOICED UNDERSTANDING AND INFORMED CONSENT SIGNED.   Bradly Bienenstock MD

## 2023-06-17 NOTE — Progress Notes (Signed)
Physical Therapy Treatment Patient Details Name: [name] MRN: [id number] DOB: October 25, 1955 Today's Date: 06/17/2023   History of Present Illness [name] is a 67 y.o. female who presented to emergency department for evaluation for fall at home. X-rays revealed acute displaced periprosthetic distal radial fracture along the proximal aspect of a prior plate and screw fixation, ulnar fx, and acute displaced diagonal fracture of the third digit metacarpal body with possible intra-articular extension to the base.  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    PT Comments  Pt in recliner upon arrival and agreeable to PT session. Limited PT session as pt was having severe pain in L UE and L hip at rest. Pt was able to complete LE exercises in sitting with no increase in pain. Pt was able to stand with 1HH and MinA for stability and take steps towards the bed. Pt declined further ambulation due to pain. Plan is for pt to L ORIF this afternoon. Pt is progressing slowly towards goals. Acute PT to follow.      If plan is discharge home, recommend the following: A little help with bathing/dressing/bathroom;Assistance with cooking/housework;Assist for transportation;Help with stairs or ramp for entrance   Can travel by private vehicle      Yes  Equipment Recommendations  None recommended by PT       Precautions / Restrictions Precautions Precautions: Fall Required Braces or  Orthoses: Splint/Cast;Sling Splint/Cast: LUE sugartong splint in place Restrictions Weight Bearing Restrictions: Yes LUE Weight Bearing: Non weight bearing     Mobility  Bed Mobility Overal bed mobility: Needs Assistance Bed Mobility: Sit to Supine       Sit to supine: Supervision, HOB elevated   General bed mobility comments: supervision for safety, HOB elevated    Transfers Overall transfer level: Needs assistance Equipment used: 1 person hand held assist Transfers: Sit to/from Stand Sit to Stand: Min assist   Step pivot transfers: Min assist       General transfer comment: MinA w/ 1HH for stability. Pt able to take steps from recliner to bed.    Ambulation/Gait  General Gait Details: Declined further ambulation due to increased pain       Balance Overall balance assessment: Needs assistance Sitting-balance support: Single extremity supported Sitting balance-Leahy Scale: Fair     Standing balance support: Single extremity supported, During functional activity Standing balance-Leahy Scale: Poor Standing balance comment: reliant on UE support for stability    Cognition Arousal: Alert Behavior During Therapy: WFL for tasks assessed/performed Overall Cognitive Status: Within Functional Limits for tasks assessed          Exercises General Exercises - Lower Extremity Long Arc Quad: AROM, Both, 15 reps, Seated Straight Leg Raises: AROM, Both, 10 reps, Seated Hip Flexion/Marching: AROM, Both, 10 reps, Seated    General Comments General comments (skin integrity, edema, etc.): VSS on RA      Pertinent Vitals/Pain Pain Assessment Pain Assessment: Faces Faces Pain Scale: Hurts whole lot Pain Location: left arm and left leg Pain Descriptors / Indicators: Aching, Discomfort, Grimacing Pain Intervention(s): Limited activity within patient's tolerance, Monitored during  session, Repositioned     PT Goals (current goals can now be found in the care plan  section) Acute Rehab PT Goals PT Goal Formulation: With patient Time For Goal Achievement: 06/22/23 Potential to Achieve Goals: Good Progress towards PT goals: Progressing toward goals    Frequency    Min 1X/week       AM-PAC PT "6 Clicks" Mobility   Outcome Measure  Help needed turning from your back to your side while in a flat bed without using bedrails?: A Little Help needed moving from lying on your back to sitting on the side of a flat bed without using bedrails?: A Little Help needed moving to and from a bed to a chair (including a wheelchair)?: A Little Help needed standing up from a chair using your arms (e.g., wheelchair or bedside chair)?: A Little Help needed to walk in hospital room?: A Little Help needed climbing 3-5 steps with a railing? : A Little 6 Click Score: 18    End of Session   Activity Tolerance: Patient limited by pain Patient left: in bed;with call bell/phone within reach Nurse Communication: Mobility status PT Visit Diagnosis: Unsteadiness on feet (R26.81);Other abnormalities of gait and mobility (R26.89);Pain Pain - Right/Left: Left Pain - part of body: Hand;Hip     Time: 6644-0347 PT Time Calculation (min) (ACUTE ONLY): 15 min  Charges:    $Therapeutic Exercise: 8-22 mins PT General Charges $$ ACUTE PT VISIT: 1 Visit                     Hilton Cork, PT, DPT Secure Chat Preferred  Rehab Office [phone]    Arturo Morton Brion Aliment 06/17/2023, 1:50 PM

## 2023-06-18 DIAGNOSIS — D696 Thrombocytopenia, unspecified: Secondary | ICD-10-CM | POA: Diagnosis not present

## 2023-06-18 DIAGNOSIS — F411 Generalized anxiety disorder: Secondary | ICD-10-CM | POA: Diagnosis not present

## 2023-06-18 DIAGNOSIS — S52502D Unspecified fracture of the lower end of left radius, subsequent encounter for closed fracture with routine healing: Secondary | ICD-10-CM | POA: Diagnosis not present

## 2023-06-18 DIAGNOSIS — G8918 Other acute postprocedural pain: Secondary | ICD-10-CM

## 2023-06-18 LAB — PROTIME-INR
INR: 1.1 (ref 0.8–1.2)
Prothrombin Time: 13.9 s (ref 11.4–15.2)

## 2023-06-18 LAB — CBC
HCT: 32.8 % — ABNORMAL LOW (ref 36.0–46.0)
Hemoglobin: 11 g/dL — ABNORMAL LOW (ref 12.0–15.0)
MCH: 34.1 pg — ABNORMAL HIGH (ref 26.0–34.0)
MCHC: 33.5 g/dL (ref 30.0–36.0)
MCV: 101.5 fL — ABNORMAL HIGH (ref 80.0–100.0)
Platelets: 97 10*3/uL — ABNORMAL LOW (ref 150–400)
RBC: 3.23 MIL/uL — ABNORMAL LOW (ref 3.87–5.11)
RDW: 12.6 % (ref 11.5–15.5)
WBC: 3.6 10*3/uL — ABNORMAL LOW (ref 4.0–10.5)
nRBC: 0 % (ref 0.0–0.2)

## 2023-06-18 LAB — COMPREHENSIVE METABOLIC PANEL
ALT: 11 U/L (ref 0–44)
AST: 21 U/L (ref 15–41)
Albumin: 2.9 g/dL — ABNORMAL LOW (ref 3.5–5.0)
Alkaline Phosphatase: 49 U/L (ref 38–126)
Anion gap: 6 (ref 5–15)
BUN: 11 mg/dL (ref 8–23)
CO2: 26 mmol/L (ref 22–32)
Calcium: 8.4 mg/dL — ABNORMAL LOW (ref 8.9–10.3)
Chloride: 107 mmol/L (ref 98–111)
Creatinine, Ser: 0.96 mg/dL (ref 0.44–1.00)
GFR, Estimated: >60 mL/min (ref >60)
Glucose, Bld: 90 mg/dL (ref 70–99)
Potassium: 3.7 mmol/L (ref 3.5–5.1)
Sodium: 139 mmol/L (ref 135–145)
Total Bilirubin: 0.6 mg/dL (ref <1.2)
Total Protein: 5.4 g/dL — ABNORMAL LOW (ref 6.5–8.1)

## 2023-06-18 LAB — HEPARIN LEVEL (UNFRACTIONATED)
Heparin Unfractionated: <0.1 [IU]/mL — ABNORMAL LOW (ref 0.30–0.70)
Heparin Unfractionated: 0.29 [IU]/mL — ABNORMAL LOW (ref 0.30–0.70)

## 2023-06-18 MED ORDER — HYDROMORPHONE HCL 1 MG/ML IJ SOLN
0.5000 mg | INTRAMUSCULAR | Status: DC | PRN
  Administered 2023-06-18 (×2): 1 mg via INTRAVENOUS
  Administered 2023-06-18: 0.5 mg via INTRAVENOUS
  Administered 2023-06-19 (×2): 1 mg via INTRAVENOUS
  Filled 2023-06-18 (×4): qty 1

## 2023-06-18 MED ORDER — GABAPENTIN 100 MG PO CAPS
100.0000 mg | ORAL_CAPSULE | Freq: Three times a day (TID) | ORAL | Status: DC
Start: 2023-06-18 — End: 2023-06-18
  Filled 2023-06-18: qty 1

## 2023-06-18 MED ORDER — HEPARIN (PORCINE) 25000 UT/250ML-% IV SOLN
1150.0000 [IU]/h | INTRAVENOUS | Status: DC
  Administered 2023-06-18 – 2023-06-19 (×2): 1100 [IU]/h via INTRAVENOUS
  Administered 2023-06-20 – 2023-06-23 (×4): 1150 [IU]/h via INTRAVENOUS
  Filled 2023-06-18 (×5): qty 250

## 2023-06-18 MED ORDER — WARFARIN - PHARMACIST DOSING INPATIENT: Freq: Every day | Status: DC

## 2023-06-18 MED ORDER — WARFARIN SODIUM 7.5 MG PO TABS
7.5000 mg | ORAL_TABLET | Freq: Once | ORAL | Status: AC
Start: 2023-06-18 — End: 2023-06-18
  Administered 2023-06-18: 7.5 mg via ORAL
  Filled 2023-06-18: qty 1

## 2023-06-18 MED ORDER — HEPARIN (PORCINE) 25000 UT/250ML-% IV SOLN
1000.0000 [IU]/h | INTRAVENOUS | Status: DC
  Administered 2023-06-18: 1000 [IU]/h via INTRAVENOUS
  Filled 2023-06-18: qty 250

## 2023-06-18 NOTE — Plan of Care (Signed)
  Problem: Education: Goal: Knowledge of General Education information will improve Description Including pain rating scale, medication(s)/side effects and non-pharmacologic comfort measures Outcome: Progressing   Problem: Elimination: Goal: Will not experience complications related to bowel motility Outcome: Progressing Goal: Will not experience complications related to urinary retention Outcome: Progressing   Problem: Safety: Goal: Ability to remain free from injury will improve Outcome: Progressing   Problem: Skin Integrity: Goal: Risk for impaired skin integrity will decrease Outcome: Progressing

## 2023-06-18 NOTE — Progress Notes (Signed)
PHARMACY - ANTICOAGULATION CONSULT NOTE  Pharmacy Consult for heparin / warfarin Indication: atrial fibrillation and chronic lower extremity DVT  Patient Measurements: Height: 5' 6.5" (168.9 cm) Weight: 66.5 kg (146 lb 9.7 oz) IBW/kg (Calculated) : 60.45 Heparin Dosing Weight: 58 Kg  Vital Signs: Temp: 98.2 F (36.8 C) (11/14 0403) Temp Source: Oral (11/14 0403) BP: 96/56 (11/14 0403) Pulse Rate: 83 (11/14 0403)  Labs: Recent Labs    06/15/23 1300 06/15/23 2246 06/16/23 4403 06/16/23 0824 06/16/23 1619 06/17/23 0037 06/17/23 0614  HGB  --   --  13.4  --   --   --  11.9*  HCT  --   --  40.1  --   --   --  35.3*  PLT  --   --  57*  --   --   --  94*  LABPROT 21.1*  --  15.1  --   --   --  14.0  INR 1.8*  --  1.2  --   --   --  1.1  HEPARINUNFRC  --    < > 0.15*  --  0.74* 0.88* 0.69  CREATININE  --   --   --  0.89  --   --  0.74   < > = values in this interval not displayed.    Estimated Creatinine Clearance: 67 mL/min (by C-G formula based on SCr of 0.74 mg/dL).    Assessment: 88 yoF here with fall on warfarin PTA for atrial fibrillation and history of DVT. INR 2.5 on arrival and vitamin K administered for possible emergent surgery.   S/p repair of wrist fracture - to resume warfarin and heparin   Goal of Therapy:  Heparin level 0.3-0.7 units/ml Monitor platelets by anticoagulation protocol: Yes   Plan:  Heparin at 1000 units / hr Warfarin 7.5 mg po x 1 dose today 8 hour heparin level Daily heparin level, INR, CBC  Thank you Okey Regal, PharmD 06/18/2023 8:37 AM

## 2023-06-18 NOTE — Progress Notes (Signed)
PHARMACY - ANTICOAGULATION CONSULT NOTE  Pharmacy Consult for heparin / warfarin Indication: atrial fibrillation and chronic lower extremity DVT  Patient Measurements: Height: 5' 6.5" (168.9 cm) Weight: 66.5 kg (146 lb 9.7 oz) IBW/kg (Calculated) : 60.45 Heparin Dosing Weight: 58 Kg  Vital Signs: Temp: 98.8 F (37.1 C) (11/14 1713) Temp Source: Oral (11/14 1713) BP: 103/50 (11/14 1347) Pulse Rate: 98 (11/14 1347)  Labs: Recent Labs    06/16/23 0638 06/16/23 0824 06/16/23 1619 06/17/23 0614 06/18/23 1006 06/18/23 1824  HGB 13.4  --   --  11.9* 11.0*  --   HCT 40.1  --   --  35.3* 32.8*  --   PLT 57*  --   --  94* 97*  --   LABPROT 15.1  --   --  14.0 13.9  --   INR 1.2  --   --  1.1 1.1  --   HEPARINUNFRC 0.15*  --    < > 0.69 <0.10* 0.29*  CREATININE  --  0.89  --  0.74 0.96  --    < > = values in this interval not displayed.    Estimated Creatinine Clearance: 55.8 mL/min (by C-G formula based on SCr of 0.96 mg/dL).    Assessment: 89 yoF here with fall on warfarin PTA for atrial fibrillation and history of DVT. INR 2.5 on arrival and vitamin K administered for possible emergent surgery.   S/p repair of wrist fracture - to resume warfarin and heparin   11/14 PM: heparin level 0.29, slightly subtherapeutic on heparin 1000 units/hr. No issues with the infusion or bleeding reported.  Goal of Therapy:  Heparin level 0.3-0.7 units/ml Monitor platelets by anticoagulation protocol: Yes   Plan:  Increase Heparin to 1100 units / hr Warfarin 7.5 mg po x 1 dose today already given 8 hour heparin level with AM labs Daily heparin level, INR, CBC  Thank you Loralee Pacas, PharmD, BCPS 06/18/2023 8:04 PM   Please check AMION for all [facility] Pharmacy phone numbers After 10:00 PM, call Main Pharmacy [phone]

## 2023-06-18 NOTE — Plan of Care (Signed)
  Problem: Education: Goal: Knowledge of General Education information will improve Description: Including pain rating scale, medication(s)/side effects and non-pharmacologic comfort measures Outcome: Progressing   Problem: Health Behavior/Discharge Planning: Goal: Ability to manage health-related needs will improve Outcome: Progressing   Problem: Clinical Measurements: Goal: Will remain free from infection Outcome: Progressing   Problem: Activity: Goal: Risk for activity intolerance will decrease Outcome: Progressing   Problem: Nutrition: Goal: Adequate nutrition will be maintained Outcome: Progressing   Problem: Coping: Goal: Level of anxiety will decrease Outcome: Progressing   Problem: Elimination: Goal: Will not experience complications related to bowel motility Outcome: Progressing Goal: Will not experience complications related to urinary retention Outcome: Progressing   Problem: Pain Management: Goal: General experience of comfort will improve Outcome: Progressing

## 2023-06-18 NOTE — Progress Notes (Signed)
Request received by the surgery desk to discuss the anesthetic course with the patient. Regional anesthesia with sedation was discussed in the pre-op period, however general anesthesia was performed due to the timing of the surgery. Details of the post-op pain management discussed the the floor RN. Patient then evaluated in room 5N15. Patient found to be resting comfortably with pain controlled.

## 2023-06-18 NOTE — Progress Notes (Signed)
Dr. Bradley Ferris, anesthesiologist, came up and checked on pt, pt resting comfortably, doctor offered to place block and pt declined at the time. MD agreeable to the plan pf cont Dilaudid PRN- order obtained from hospitialist on-call. MD will call family.  06/17/23, 2200  Contacted surgeon re: family concerned with reasoning why patient did not receive block after explained in detail and consent signed. Ortho on-call, Melvyn Novas returned call and was informed of this, he advised Clinical research associate to follow-up w/ anesthesiology services. Family informed of this and left phone # for anesthesiologist to call.
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
## 2023-06-18 NOTE — Progress Notes (Signed)
Pt noted to have removed ACE wrap from LUE because she thought it was wet, Kerlix still intact with old, dried drainage present ABD pads placed and ACE wrap replaced, pt education as to rationale for dressing reinforced and reiterated, pt will likely cont to need reinforcement as she does not like dressing in place and daughter reported that pt cut dressing off last time she had surgery to the other arm.

## 2023-06-18 NOTE — Anesthesia Postprocedure Evaluation (Signed)
Anesthesia Post Note  Patient: [name]  Procedure(s) Performed: OPEN REDUCTION INTERNAL FIXATION (ORIF) DISTAL RADIUS FRACTURE (Left: Hand)     Patient location during evaluation: PACU Anesthesia Type: General Level of consciousness: awake Pain management: satisfactory to patient Vital Signs Assessment: post-procedure vital signs reviewed and stable Respiratory status: spontaneous breathing, nonlabored ventilation and respiratory function stable Cardiovascular status: blood pressure returned to baseline and stable Postop Assessment: no apparent nausea or vomiting Anesthetic complications: no   No notable events documented.  Last Vitals:  Vitals:   06/17/23 2100 06/17/23 2112  BP: 134/69 130/68  Pulse: 93 (!) 102  Resp: 13 18  Temp:  37.1 C  SpO2: 93% 95%    Last Pain:  Vitals:   06/17/23 2112  TempSrc: Oral  PainSc: 10-Worst pain ever                 Jonny Dearden P Saoirse Legere

## 2023-06-18 NOTE — Care Management Important Message (Signed)
Important Message  Patient Details  Name: [name] MRN: [id number] Date of Birth: Nov 19, 1955   Important Message Given:  Yes - Medicare IM     Sherilyn Banker 06/18/2023, 3:11 PM

## 2023-06-18 NOTE — Progress Notes (Signed)
Mobility Specialist: Progress Note   06/18/23 1547  Mobility  Activity Ambulated with assistance in room  Level of Assistance Minimal assist, patient does 75% or more  Assistive Device Other (Comment) (HHA)  Distance Ambulated (ft) 6 ft  LUE Weight Bearing NWB  Activity Response Tolerated well  Mobility Referral Yes  $Mobility charge 1 Mobility  Mobility Specialist Start Time (ACUTE ONLY) 1507  Mobility Specialist Stop Time (ACUTE ONLY) 1540  Mobility Specialist Time Calculation (min) (ACUTE ONLY) 33 min    Pt was agreeable to mobility - received in bed. Pt had a slight tremor that increased with exertion and activity. VSS throughout on 2LO2. Had c/o spasmic LUE pain that like a knife and hot needles, also stated she felt like LUE swelling had increased. Also c/o neuropathy causing bil foot numbness and L hip pain. SV for bed mobility with extra time, light minA for STS, MinA for ambulation d/t unsteadiness. Pt was very anxious of falling. Took seated rest in recliner and was wheeled back to side of bed. Completed stand pivot from chair to bed with minA. Left in bed with all needs met, call bell in reach.   Maurene Capes Mobility Specialist Please contact via SecureChat or Rehab office at [phone]

## 2023-06-18 NOTE — Progress Notes (Signed)
Patient ID: GWENEVIEVE TRUDO, female   DOB: 1956-05-27, 67 y.o.   MRN: [id number]   LOS: 3 days   Subjective: Having severe burning pain in palm of hand mainly.   Objective: Vital signs in last 24 hours: Temp:  [97.5 F (36.4 C)-99.2 F (37.3 C)] 98.6 F (37 C) (11/14 0900) Pulse Rate:  [83-112] 94 (11/14 0900) Resp:  [13-23] 16 (11/14 0900) BP: (94-154)/(55-98) 98/55 (11/14 0900) SpO2:  [90 %-100 %] 94 % (11/14 0900) Weight:  [66.5 kg] 66.5 kg (11/14 0630) Last BM Date : 06/17/23   Laboratory  CBC Recent Labs    06/16/23 0638 06/17/23 0614  WBC 2.8* 2.9*  HGB 13.4 11.9*  HCT 40.1 35.3*  PLT 57* 94*   BMET Recent Labs    06/16/23 0824 06/17/23 0614  NA 143 139  K 3.5 4.0  CL 106 105  CO2 31 26  GLUCOSE 93 85  BUN <5* 10  CREATININE 0.89 0.74  CALCIUM 8.9 8.6*     Physical Exam General appearance: alert and mild distress LUE -- Dressing/splint removed. Incisions look good, FA and hand soft. Severe TTP to light touch hand/fingers, mod distal FA.   Assessment/Plan: S/p ORIF left radius and 3rd [facility] w/hardware removal -- Will add neurontin to regimen as pain sounds neuropathic in nature. Encouraged elevation.    Freeman Caldron, PA-C Orthopedic Surgery [phone] 06/18/2023

## 2023-06-19 ENCOUNTER — Encounter (HOSPITAL_COMMUNITY): Payer: Self-pay | Admitting: Orthopedic Surgery

## 2023-06-19 DIAGNOSIS — G8918 Other acute postprocedural pain: Secondary | ICD-10-CM | POA: Diagnosis not present

## 2023-06-19 DIAGNOSIS — D696 Thrombocytopenia, unspecified: Secondary | ICD-10-CM | POA: Diagnosis not present

## 2023-06-19 DIAGNOSIS — K5900 Constipation, unspecified: Secondary | ICD-10-CM

## 2023-06-19 DIAGNOSIS — S52502D Unspecified fracture of the lower end of left radius, subsequent encounter for closed fracture with routine healing: Secondary | ICD-10-CM | POA: Diagnosis not present

## 2023-06-19 LAB — COMPREHENSIVE METABOLIC PANEL
ALT: 13 U/L (ref 0–44)
AST: 21 U/L (ref 15–41)
Albumin: 2.9 g/dL — ABNORMAL LOW (ref 3.5–5.0)
Alkaline Phosphatase: 48 U/L (ref 38–126)
Anion gap: 8 (ref 5–15)
BUN: 8 mg/dL (ref 8–23)
CO2: 27 mmol/L (ref 22–32)
Calcium: 8.6 mg/dL — ABNORMAL LOW (ref 8.9–10.3)
Chloride: 104 mmol/L (ref 98–111)
Creatinine, Ser: 0.71 mg/dL (ref 0.44–1.00)
GFR, Estimated: >60 mL/min (ref >60)
Glucose, Bld: 95 mg/dL (ref 70–99)
Potassium: 4.1 mmol/L (ref 3.5–5.1)
Sodium: 139 mmol/L (ref 135–145)
Total Bilirubin: 0.3 mg/dL (ref <1.2)
Total Protein: 5.2 g/dL — ABNORMAL LOW (ref 6.5–8.1)

## 2023-06-19 LAB — HEPARIN LEVEL (UNFRACTIONATED)
Heparin Unfractionated: 0.44 [IU]/mL (ref 0.30–0.70)
Heparin Unfractionated: 0.36 [IU]/mL (ref 0.30–0.70)

## 2023-06-19 LAB — CBC
HCT: 31.3 % — ABNORMAL LOW (ref 36.0–46.0)
Hemoglobin: 9.9 g/dL — ABNORMAL LOW (ref 12.0–15.0)
MCH: 33 pg (ref 26.0–34.0)
MCHC: 31.6 g/dL (ref 30.0–36.0)
MCV: 104.3 fL — ABNORMAL HIGH (ref 80.0–100.0)
Platelets: 87 10*3/uL — ABNORMAL LOW (ref 150–400)
RBC: 3 MIL/uL — ABNORMAL LOW (ref 3.87–5.11)
RDW: 12.7 % (ref 11.5–15.5)
WBC: 2.8 10*3/uL — ABNORMAL LOW (ref 4.0–10.5)
nRBC: 0 % (ref 0.0–0.2)

## 2023-06-19 LAB — PROTIME-INR
INR: 1.1 (ref 0.8–1.2)
Prothrombin Time: 14.8 s (ref 11.4–15.2)

## 2023-06-19 MED ORDER — SENNOSIDES-DOCUSATE SODIUM 8.6-50 MG PO TABS
1.0000 | ORAL_TABLET | Freq: Two times a day (BID) | ORAL | Status: DC
  Administered 2023-06-19 – 2023-06-20 (×4): 1 via ORAL
  Filled 2023-06-19 (×4): qty 1

## 2023-06-19 MED ORDER — WARFARIN SODIUM 7.5 MG PO TABS
7.5000 mg | ORAL_TABLET | Freq: Once | ORAL | Status: AC
  Administered 2023-06-19: 7.5 mg via ORAL
  Filled 2023-06-19: qty 1

## 2023-06-19 MED ORDER — OXYCODONE-ACETAMINOPHEN 5-325 MG PO TABS
1.0000 | ORAL_TABLET | Freq: Four times a day (QID) | ORAL | Status: DC | PRN
  Administered 2023-06-19 – 2023-06-22 (×10): 2 via ORAL
  Filled 2023-06-19 (×10): qty 2

## 2023-06-19 MED ORDER — POLYETHYLENE GLYCOL 3350 17 G PO PACK
17.0000 g | PACK | Freq: Two times a day (BID) | ORAL | Status: DC
  Administered 2023-06-19 – 2023-06-22 (×5): 17 g via ORAL
  Filled 2023-06-19 (×6): qty 1

## 2023-06-19 MED ORDER — HYDROMORPHONE HCL 1 MG/ML IJ SOLN
1.0000 mg | INTRAMUSCULAR | Status: DC | PRN
  Administered 2023-06-19 – 2023-06-22 (×9): 1 mg via INTRAVENOUS
  Filled 2023-06-19 (×9): qty 1

## 2023-06-19 MED ORDER — BISACODYL 10 MG RE SUPP: 10.0000 mg | Freq: Once | RECTAL | Status: DC

## 2023-06-19 NOTE — Progress Notes (Addendum)
Physical Therapy Treatment Patient Details Name: [name] MRN: [id number] DOB: 1956-06-03 Today's Date: 06/19/2023   History of Present Illness 67 y.o. female who presented to emergency department for evaluation for fall at home.   S/p ORIF of L radius and 3rd [facility] w/ hardware removal on 11/13. PMHx: invasive ductal carcinoma of left breast, chronic thrombocytopenia, paroxysmal atrial fibrillation, essential hypertension, hyperlipidemia, GERD, chronic B LE DVT status post IVC, R UE axillary DVT in 02/21/2023 started on anticoagulation Eliquis then warfarin, chronic pain, COPD, generalized anxiety disorder, history of intracranial bleed, stress incontinence, seizure disorder, neuropathy, a-fib    PT Comments  Pt in bed upon arrival and agreeable to PT session. Pt needed increased physical assistance in today's session requiring ModA to stand and take a few steps to the recliner. Pt had multiple LOB corrected with ModA at the hips and 1UE. Pt tends to lean in multiple directions and had difficulty lifting LE's to have foot clearance. Increased time and effort to travel ~6 feet total. Pt reports not feeling safe and having a fear of falling with current mobility status. Pt is not safe to return home at current mobility level with also needing to ascend/descend stairs to get into home. Recommending >3hrs post acute rehab to work towards independence. Pt has good family support, positive experiences with rehab, and strong motivation to improve mobility. Acute PT to follow.     If plan is discharge home, recommend the following: Assistance with cooking/housework;Assist for transportation;Help with stairs or ramp for entrance;A lot of help with walking and/or transfers;A little help with bathing/dressing/bathroom   Can travel by private vehicle        Equipment Recommendations  None recommended by PT (will continue to assess)    Recommendations for Other Services Rehab consult     Precautions /  Restrictions Precautions Precautions: Fall Required Braces or Orthoses: Splint/Cast;Sling Splint/Cast: LUE sugartong splint in place Restrictions Weight Bearing Restrictions: Yes LUE Weight Bearing: Non weight bearing     Mobility  Bed Mobility Overal bed mobility: Needs Assistance Bed Mobility: Supine to Sit     Supine to sit: Contact guard, HOB elevated     General bed mobility comments: CGA for safety w/ HOB elevated    Transfers Overall transfer level: Needs assistance Equipment used: 1 person hand held assist Transfers: Sit to/from Stand Sit to Stand: Mod assist           General transfer comment: ModA w/ 1HH for unsteadiness upon rising. Pt leans in multiple directions and has difficulty standing with no support    Ambulation/Gait Ambulation/Gait assistance: Mod assist Gait Distance (Feet): 6 Feet Assistive device: IV Pole Gait Pattern/deviations: Step-to pattern, Shuffle, Leaning posteriorly, Staggering left, Staggering right Gait velocity: dec     General Gait Details: pt very unsteady requiring ModA to correct 4 LOB. Pt able to slide LE's forward, but unable to step. Pt leans posteriorly and anteriorly requiring contact at hips to correct. Increased time and effort       Balance Overall balance assessment: Needs assistance Sitting-balance support: Single extremity supported Sitting balance-Leahy Scale: Fair   Postural control: Posterior lean Standing balance support: Single extremity supported, During functional activity Standing balance-Leahy Scale: Poor Standing balance comment: reliant on UE support for stability. LOB with steps      Cognition Arousal: Alert Behavior During Therapy: WFL for tasks assessed/performed Overall Cognitive Status: Within Functional Limits for tasks assessed          Exercises General Exercises -  Lower Extremity Hip Flexion/Marching: AROM, Both, 10 reps, Seated    General Comments General comments (skin  integrity, edema, etc.): VSS on O2, L arm splint in place      Pertinent Vitals/Pain Pain Assessment Pain Assessment: Faces Faces Pain Scale: Hurts whole lot Pain Location: left arm and left leg Pain Descriptors / Indicators: Aching, Discomfort, Grimacing Pain Intervention(s): Premedicated before session, Limited activity within patient's tolerance, Monitored during session, Repositioned     PT Goals (current goals can now be found in the care plan section) Acute Rehab PT Goals PT Goal Formulation: With patient Time For Goal Achievement: 06/22/23 Potential to Achieve Goals: Good Progress towards PT goals: Not progressing toward goals - comment    Frequency    Min 1X/week       AM-PAC PT "6 Clicks" Mobility   Outcome Measure  Help needed turning from your back to your side while in a flat bed without using bedrails?: A Little Help needed moving from lying on your back to sitting on the side of a flat bed without using bedrails?: A Little Help needed moving to and from a bed to a chair (including a wheelchair)?: A Lot Help needed standing up from a chair using your arms (e.g., wheelchair or bedside chair)?: A Lot Help needed to walk in hospital room?: Total Help needed climbing 3-5 steps with a railing? : Total 6 Click Score: 12    End of Session Equipment Utilized During Treatment: Gait belt Activity Tolerance: Patient limited by pain Patient left: with call bell/phone within reach;in chair;with chair alarm set Nurse Communication: Mobility status (need for increased support while ambulating) PT Visit Diagnosis: Unsteadiness on feet (R26.81);Other abnormalities of gait and mobility (R26.89);Pain Pain - Right/Left: Left Pain - part of body: Hand;Hip     Time: 1136-1203 PT Time Calculation (min) (ACUTE ONLY): 27 min  Charges:    $Gait Training: 8-22 mins $Therapeutic Activity: 8-22 mins PT General Charges $$ ACUTE PT VISIT: 1 Visit                     Hilton Cork,  PT, DPT Secure Chat Preferred  Rehab Office [phone]   Arturo Morton Brion Aliment 06/19/2023, 3:08 PM

## 2023-06-19 NOTE — Progress Notes (Addendum)
PHARMACY - ANTICOAGULATION CONSULT NOTE  Pharmacy Consult for heparin / warfarin Indication: atrial fibrillation and chronic lower extremity DVT  Patient Measurements: Height: 5' 6.5" (168.9 cm) Weight: 66.5 kg (146 lb 9.7 oz) IBW/kg (Calculated) : 60.45 Heparin Dosing Weight: 58 Kg  Vital Signs: Temp: 98.3 F (36.8 C) (11/14 2024) Temp Source: Oral (11/14 2024) BP: 111/61 (11/14 2054) Pulse Rate: 78 (11/14 2054)  Labs: Recent Labs    06/17/23 0614 06/18/23 1006 06/18/23 1824 06/19/23 0708  HGB 11.9* 11.0*  --  9.9*  HCT 35.3* 32.8*  --  31.3*  PLT 94* 97*  --  87*  LABPROT 14.0 13.9  --  14.8  INR 1.1 1.1  --  1.1  HEPARINUNFRC 0.69 <0.10* 0.29* 0.44  CREATININE 0.74 0.96  --  0.71    Estimated Creatinine Clearance: 67 mL/min (by C-G formula based on SCr of 0.71 mg/dL).    Assessment: 70 yoF here with fall on warfarin PTA for atrial fibrillation and history of DVT. INR 2.5 on arrival and vitamin K administered for possible emergent surgery.   S/p repair of wrist fracture on 11/13 - to resume warfarin and heparin.  Heparin level 0.44 and therapeutic on heparin 1100 units/hr. INR is subtherapeutic at 1.1. No bleeding reported, Hgb down 9.9, platelets are low at 87.  Goal of Therapy:  INR 2-3 Heparin level 0.3-0.7 units/ml Monitor platelets by anticoagulation protocol: Yes   Plan:  Continue heparin drip at 1100 units/hr Warfarin 7.5 mg po x 1 dose today  Daily heparin level, INR, CBC Monitor for s/sx of bleeding  Thank you for involving pharmacy in this patient's care.  Loura Back, PharmD, BCPS Clinical Pharmacist Clinical phone for 06/19/2023 is [phone] 06/19/2023 8:05 AM   Addendum: Confirmatory heparin level is therapeutic at 0.36 on 1100 units/hr and has trended down. No bleeding noted.   Increase heparin drip to 1150 units/hr to keep heparin level >0.3 F/u am labs  [facility], PharmD, BCPS 2:28 PM

## 2023-06-19 NOTE — Plan of Care (Signed)
  Problem: Education: Goal: Knowledge of General Education information will improve Description: Including pain rating scale, medication(s)/side effects and non-pharmacologic comfort measures Outcome: Progressing   Problem: Health Behavior/Discharge Planning: Goal: Ability to manage health-related needs will improve Outcome: Progressing   Problem: Clinical Measurements: Goal: Ability to maintain clinical measurements within normal limits will improve Outcome: Progressing Goal: Will remain free from infection Outcome: Progressing Goal: Diagnostic test results will improve Outcome: Progressing Goal: Respiratory complications will improve Outcome: Progressing Goal: Cardiovascular complication will be avoided Outcome: Progressing   Problem: Activity: Goal: Risk for activity intolerance will decrease Outcome: Progressing   Problem: Nutrition: Goal: Adequate nutrition will be maintained Outcome: Progressing   Problem: Coping: Goal: Level of anxiety will decrease Outcome: Progressing   Problem: Pain Management: Goal: General experience of comfort will improve Outcome: Progressing   Problem: Safety: Goal: Ability to remain free from injury will improve Outcome: Progressing

## 2023-06-19 NOTE — Progress Notes (Signed)
Inpatient Rehab Admissions Coordinator:   Per PT recommendations pt was screened for CIR by Estill Dooms, PT, DPT.  OT recommending HH.  At this time, pt does not appear to have the medical necessity to support a CIR request with [facility].  No consult at this time.  If she is unable to return home may need to evaluate for SNF.    Estill Dooms, PT, DPT Admissions Coordinator [phone] 06/19/23  1:36 PM
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
## 2023-06-19 NOTE — Plan of Care (Signed)
  Problem: Clinical Measurements: Goal: Ability to maintain clinical measurements within normal limits will improve Outcome: Progressing Goal: Cardiovascular complication will be avoided Outcome: Progressing   Problem: Coping: Goal: Level of anxiety will decrease Outcome: Progressing

## 2023-06-20 DIAGNOSIS — S52502D Unspecified fracture of the lower end of left radius, subsequent encounter for closed fracture with routine healing: Secondary | ICD-10-CM | POA: Diagnosis not present

## 2023-06-20 DIAGNOSIS — G8918 Other acute postprocedural pain: Secondary | ICD-10-CM | POA: Diagnosis not present

## 2023-06-20 DIAGNOSIS — K5900 Constipation, unspecified: Secondary | ICD-10-CM | POA: Diagnosis not present

## 2023-06-20 DIAGNOSIS — D696 Thrombocytopenia, unspecified: Secondary | ICD-10-CM | POA: Diagnosis not present

## 2023-06-20 LAB — CBC
HCT: 31.4 % — ABNORMAL LOW (ref 36.0–46.0)
Hemoglobin: 10.4 g/dL — ABNORMAL LOW (ref 12.0–15.0)
MCH: 33.2 pg (ref 26.0–34.0)
MCHC: 33.1 g/dL (ref 30.0–36.0)
MCV: 100.3 fL — ABNORMAL HIGH (ref 80.0–100.0)
Platelets: 91 10*3/uL — ABNORMAL LOW (ref 150–400)
RBC: 3.13 MIL/uL — ABNORMAL LOW (ref 3.87–5.11)
RDW: 12.7 % (ref 11.5–15.5)
WBC: 2.7 10*3/uL — ABNORMAL LOW (ref 4.0–10.5)
nRBC: 0 % (ref 0.0–0.2)

## 2023-06-20 LAB — PROTIME-INR
INR: 1.4 — ABNORMAL HIGH (ref 0.8–1.2)
Prothrombin Time: 17.6 s — ABNORMAL HIGH (ref 11.4–15.2)

## 2023-06-20 LAB — HEPARIN LEVEL (UNFRACTIONATED): Heparin Unfractionated: 0.52 [IU]/mL (ref 0.30–0.70)

## 2023-06-20 MED ORDER — BISACODYL 10 MG RE SUPP
10.0000 mg | Freq: Once | RECTAL | Status: AC
  Administered 2023-06-20: 10 mg via RECTAL
  Filled 2023-06-20: qty 1

## 2023-06-20 MED ORDER — WARFARIN SODIUM 7.5 MG PO TABS
7.5000 mg | ORAL_TABLET | Freq: Once | ORAL | Status: AC
  Administered 2023-06-20: 7.5 mg via ORAL
  Filled 2023-06-20: qty 1

## 2023-06-20 NOTE — Progress Notes (Signed)
Bleeding observed to left forearm, unwrapped and reinforced with gauze and rewrapped, pharmacist and ortho PA notified. Advised to monitor for any further bleeding

## 2023-06-20 NOTE — Progress Notes (Addendum)
PHARMACY - ANTICOAGULATION CONSULT NOTE  Pharmacy Consult for heparin / warfarin Indication: atrial fibrillation and chronic lower extremity DVT  Patient Measurements: Height: 5' 6.5" (168.9 cm) Weight: 66.5 kg (146 lb 9.7 oz) IBW/kg (Calculated) : 60.45 Heparin Dosing Weight: 58 Kg  Vital Signs: Temp: 97.9 F (36.6 C) (11/16 0741) Temp Source: Oral (11/16 0741) BP: 108/60 (11/16 0741) Pulse Rate: 78 (11/16 0741)  Labs: Recent Labs    06/18/23 1006 06/18/23 1824 06/19/23 0708 06/19/23 1301 06/20/23 0537  HGB 11.0*  --  9.9*  --  10.4*  HCT 32.8*  --  31.3*  --  31.4*  PLT 97*  --  87*  --  91*  LABPROT 13.9  --  14.8  --  17.6*  INR 1.1  --  1.1  --  1.4*  HEPARINUNFRC <0.10*   < > 0.44 0.36 0.52  CREATININE 0.96  --  0.71  --   --    < > = values in this interval not displayed.    Estimated Creatinine Clearance: 67 mL/min (by C-G formula based on SCr of 0.71 mg/dL).    Assessment: 55 yoF here with fall on warfarin PTA for atrial fibrillation and history of DVT. INR 2.5 on arrival and vitamin K administered for possible emergent surgery. S/p repair of wrist fracture on 11/13 - to resume warfarin and heparin.  11/16 Assessment: Heparin level 0.52 this morning, in therapeutic range on 1150 units/hour. INR remains subtherapeutic today at 1.4. Patient status post warfarin 7.5 mg PO x2 days. Home dose reportedly Warfarin 6 mg every day. Hgb back up to 10.4 today, platelets 91. No bleeding noted in chart.  Goal of Therapy:  INR 2-3 Heparin level 0.3-0.7 units/ml Monitor platelets by anticoagulation protocol: Yes   Plan:  Continue heparin drip at 1150 units/hr Warfarin 7.5 mg po x 1 dose today  Daily heparin level, INR, CBC Monitor for s/sx of bleeding  Thank you for involving pharmacy in this patient's care.  Lora Paula, PharmD PGY-2 Infectious Diseases Pharmacy Resident [facility] 06/20/2023 8:09 AM

## 2023-06-20 NOTE — Progress Notes (Addendum)
OT Cancellation Note  Patient Details Name: [name] MRN: [id number] DOB: 1955/11/15   Cancelled Treatment:    Reason Eval/Treat Not Completed: Medical issues which prohibited therapy.  Attempted skilled OT treatment session.  Pt. States "I feel so nauseous, I tried eating and I did and now I'm very nauseous".  Began to vomit, emesis bag provided and rn notified.  Will hold for now and check back as pt. Able.    Alessandra Bevels Lorraine-COTA/L 06/20/2023, 1:09 PM

## 2023-06-20 NOTE — Progress Notes (Addendum)
Dr. Waymon Amato made aware the patient is still complaining of severe pain in her left arm and complaining that she feels as if it is becoming more swollen despite keeping her arm elevated.    I spoke with Dr. Yehuda Budd from ortho and he is ok with the ortho tech coming to adjust the splint as long as the sterile dressing remains in place and the incision stays covered. The on call ortho tech will come to bedside to assess the splint to see what he can do. I informed him of Dr. Yehuda Budd request to keep the sterile dressing in place and incision covered.

## 2023-06-20 NOTE — Plan of Care (Signed)

## 2023-06-20 NOTE — Progress Notes (Signed)
Mobility Specialist: Progress Note   06/20/23 1228  Mobility  Activity Ambulated with assistance in room  Level of Assistance Moderate assist, patient does 50-74%  Assistive Device Other (Comment) (IV pole, HHA)  Distance Ambulated (ft) 30 ft  LUE Weight Bearing NWB  Activity Response Tolerated well  Mobility Referral Yes  $Mobility charge 1 Mobility  Mobility Specialist Start Time (ACUTE ONLY) [id number]  Mobility Specialist Stop Time (ACUTE ONLY) 1003  Mobility Specialist Time Calculation (min) (ACUTE ONLY) 24 min    Pt was agreeable to mobility session - received in bed. Had c/o LUE pain. SV for bed mobility, minA for STS, minA to modA for ambulation - assistance increased at times d/t unsteadiness and LOB. Initially used HHA, but offered IV pole instead and pt felt more steady with that. Returned to bed without fault. Left in bed with all needs met, call bell in reach.   Maurene Capes Mobility Specialist Please contact via SecureChat or Rehab office at [phone]
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
## 2023-06-20 NOTE — Progress Notes (Signed)
Orthopedic Tech Progress Note Patient Details:  [name] November 16, 1955 [id number]  Patient ID: [name], female   DOB: 05-06-1956, 67 y.o.   MRN: [id number] I got a call from the Rn to come and check the splint/dressing on the lue. When I arrived I spoke with the patient about what was going on. They said that they were feeling some tightness, worried about the blood that had seeped out from around their fingers and soreness around the elbow. I unwrapped the splint down to the prefab removing one ace wrap and 2 kerlix. I then checked the splint and the dressing under it, the dressing was loose and the splint was in place properly. I then checked around the fingers and noticed the little amount of blood that had come out was probably just some seeping. I explained all of this to the patient and showed them how the dressing was still loose. I then explained that the splint around the elbow was on properly. I then rewrapped it with 1 kerlix and ace wrap loosely. The patient then said they felt better. Trinna Post 06/20/2023, 8:10 PM

## 2023-06-21 DIAGNOSIS — M79609 Pain in unspecified limb: Secondary | ICD-10-CM

## 2023-06-21 DIAGNOSIS — M792 Neuralgia and neuritis, unspecified: Secondary | ICD-10-CM | POA: Diagnosis not present

## 2023-06-21 DIAGNOSIS — G8918 Other acute postprocedural pain: Secondary | ICD-10-CM | POA: Diagnosis not present

## 2023-06-21 DIAGNOSIS — K5900 Constipation, unspecified: Secondary | ICD-10-CM | POA: Diagnosis not present

## 2023-06-21 LAB — CBC
HCT: 34.9 % — ABNORMAL LOW (ref 36.0–46.0)
Hemoglobin: 11.4 g/dL — ABNORMAL LOW (ref 12.0–15.0)
MCH: 33.1 pg (ref 26.0–34.0)
MCHC: 32.7 g/dL (ref 30.0–36.0)
MCV: 101.5 fL — ABNORMAL HIGH (ref 80.0–100.0)
Platelets: 105 10*3/uL — ABNORMAL LOW (ref 150–400)
RBC: 3.44 MIL/uL — ABNORMAL LOW (ref 3.87–5.11)
RDW: 12.8 % (ref 11.5–15.5)
WBC: 2.5 10*3/uL — ABNORMAL LOW (ref 4.0–10.5)
nRBC: 0 % (ref 0.0–0.2)

## 2023-06-21 LAB — PROTIME-INR
INR: 1.7 — ABNORMAL HIGH (ref 0.8–1.2)
Prothrombin Time: 19.9 s — ABNORMAL HIGH (ref 11.4–15.2)

## 2023-06-21 LAB — HEPARIN LEVEL (UNFRACTIONATED): Heparin Unfractionated: 0.59 [IU]/mL (ref 0.30–0.70)

## 2023-06-21 LAB — BASIC METABOLIC PANEL
Anion gap: 8 (ref 5–15)
BUN: 6 mg/dL — ABNORMAL LOW (ref 8–23)
CO2: 28 mmol/L (ref 22–32)
Calcium: 9.3 mg/dL (ref 8.9–10.3)
Chloride: 104 mmol/L (ref 98–111)
Creatinine, Ser: 0.79 mg/dL (ref 0.44–1.00)
GFR, Estimated: >60 mL/min (ref >60)
Glucose, Bld: 92 mg/dL (ref 70–99)
Potassium: 4 mmol/L (ref 3.5–5.1)
Sodium: 140 mmol/L (ref 135–145)

## 2023-06-21 MED ORDER — SENNOSIDES-DOCUSATE SODIUM 8.6-50 MG PO TABS
2.0000 | ORAL_TABLET | Freq: Two times a day (BID) | ORAL | Status: DC
  Administered 2023-06-21 – 2023-06-23 (×5): 2 via ORAL
  Filled 2023-06-21 (×5): qty 2

## 2023-06-21 MED ORDER — GABAPENTIN 100 MG PO CAPS
200.0000 mg | ORAL_CAPSULE | Freq: Two times a day (BID) | ORAL | Status: DC
  Administered 2023-06-21 – 2023-06-23 (×5): 200 mg via ORAL
  Filled 2023-06-21 (×5): qty 2

## 2023-06-21 MED ORDER — MILK AND MOLASSES ENEMA
1.0000 | Freq: Once | RECTAL | Status: AC
  Administered 2023-06-21: 240 mL via RECTAL
  Filled 2023-06-21: qty 240

## 2023-06-21 MED ORDER — WARFARIN SODIUM 6 MG PO TABS
6.0000 mg | ORAL_TABLET | Freq: Once | ORAL | Status: AC
  Administered 2023-06-21: 6 mg via ORAL
  Filled 2023-06-21: qty 1
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
## 2023-06-21 NOTE — Plan of Care (Signed)

## 2023-06-21 NOTE — Progress Notes (Signed)
PHARMACY - ANTICOAGULATION CONSULT NOTE  Pharmacy Consult for heparin / warfarin Indication: atrial fibrillation and chronic lower extremity DVT  Patient Measurements: Height: 5' 6.5" (168.9 cm) Weight: 66.5 kg (146 lb 9.7 oz) IBW/kg (Calculated) : 60.45 Heparin Dosing Weight: 58 Kg  Vital Signs: Temp: 98.4 F (36.9 C) (11/17 0746) Temp Source: Oral (11/17 0746) BP: 119/56 (11/17 0746) Pulse Rate: 81 (11/17 0746)  Labs: Recent Labs    06/18/23 1006 06/18/23 1824 06/19/23 0708 06/19/23 1301 06/20/23 0537 06/21/23 0625  HGB 11.0*  --  9.9*  --  10.4* 11.4*  HCT 32.8*  --  31.3*  --  31.4* 34.9*  PLT 97*  --  87*  --  91* 105*  LABPROT 13.9  --  14.8  --  17.6* 19.9*  INR 1.1  --  1.1  --  1.4* 1.7*  HEPARINUNFRC <0.10*   < > 0.44 0.36 0.52 0.59  CREATININE 0.96  --  0.71  --   --  0.79   < > = values in this interval not displayed.    Estimated Creatinine Clearance: 67 mL/min (by C-G formula based on SCr of 0.79 mg/dL).   Assessment: 37 yoF here with fall on warfarin PTA for atrial fibrillation and history of DVT. INR 2.5 on arrival and vitamin K administered for possible emergent surgery. S/p repair of wrist fracture on 11/13 - to resume warfarin and heparin.  11/17 Assessment: Heparin level 0.59 this morning, in therapeutic range on 1150 units/hour. INR remains subtherapeutic today at 1.7. Patient status post warfarin 7.5 mg PO x3 days. Home dose reportedly Warfarin 6 mg every day. Hgb 11.4 today, platelets 105. Mild, self limiting, bleeding noted from L forearm yesterday. Per RN, no bleeding from site today. Will resume home regimen today.  Goal of Therapy:  INR 2-3 Heparin level 0.3-0.7 units/ml Monitor platelets by anticoagulation protocol: Yes   Plan:  Continue heparin drip at 1150 units/hr Warfarin 6 mg po x 1 dose today  Daily heparin level, INR, CBC Monitor for s/sx of bleeding  Thank you for involving pharmacy in this patient's care.  Lora Paula,  PharmD PGY-2 Infectious Diseases Pharmacy Resident [facility] 06/21/2023 8:22 AM

## 2023-06-21 NOTE — Plan of Care (Signed)
  Problem: Education: Goal: Knowledge of General Education information will improve Description: Including pain rating scale, medication(s)/side effects and non-pharmacologic comfort measures Outcome: Progressing   Problem: Clinical Measurements: Goal: Ability to maintain clinical measurements within normal limits will improve Outcome: Progressing Goal: Will remain free from infection Outcome: Progressing   Problem: Activity: Goal: Risk for activity intolerance will decrease Outcome: Progressing   Problem: Nutrition: Goal: Adequate nutrition will be maintained Outcome: Progressing   Problem: Coping: Goal: Level of anxiety will decrease Outcome: Progressing   Problem: Elimination: Goal: Will not experience complications related to urinary retention Outcome: Progressing   Problem: Pain Management: Goal: General experience of comfort will improve Outcome: Progressing   Problem: Safety: Goal: Ability to remain free from injury will improve Outcome: Progressing

## 2023-06-21 NOTE — Progress Notes (Signed)
Mobility Specialist: Progress Note   06/21/23 1607  Mobility  Activity Transferred to/from [facility]  Level of Assistance Minimal assist, patient does 75% or more  Assistive Device Other (Comment) (IV pole)  LUE Weight Bearing NWB  Activity Response Tolerated fair  Mobility Referral Yes  $Mobility charge 1 Mobility  Mobility Specialist Start Time (ACUTE ONLY) 1443  Mobility Specialist Stop Time (ACUTE ONLY) 1501  Mobility Specialist Time Calculation (min) (ACUTE ONLY) 18 min    Pt requested to use the bedpan - received in bed. Agreeable to try to transfer to [facility]. Had c/o LUE pain. SV for bed mobility, MinA for STS, MinA to modA for ambulation d/t unsteadiness and decreased coordination. Completed void and BM; MS assisted with peri care. Left in bed with all needs met, call bell in reach. Bed alarm on.    Maurene Capes Mobility Specialist Please contact via SecureChat or Rehab office at [phone]

## 2023-06-22 DIAGNOSIS — G8918 Other acute postprocedural pain: Secondary | ICD-10-CM | POA: Diagnosis not present

## 2023-06-22 DIAGNOSIS — K5900 Constipation, unspecified: Secondary | ICD-10-CM | POA: Diagnosis not present

## 2023-06-22 DIAGNOSIS — M79609 Pain in unspecified limb: Secondary | ICD-10-CM | POA: Diagnosis not present

## 2023-06-22 DIAGNOSIS — M792 Neuralgia and neuritis, unspecified: Secondary | ICD-10-CM | POA: Diagnosis not present

## 2023-06-22 LAB — CBC
HCT: 34 % — ABNORMAL LOW (ref 36.0–46.0)
Hemoglobin: 10.9 g/dL — ABNORMAL LOW (ref 12.0–15.0)
MCH: 33.2 pg (ref 26.0–34.0)
MCHC: 32.1 g/dL (ref 30.0–36.0)
MCV: 103.7 fL — ABNORMAL HIGH (ref 80.0–100.0)
Platelets: 109 10*3/uL — ABNORMAL LOW (ref 150–400)
RBC: 3.28 MIL/uL — ABNORMAL LOW (ref 3.87–5.11)
RDW: 13 % (ref 11.5–15.5)
WBC: 2.6 10*3/uL — ABNORMAL LOW (ref 4.0–10.5)
nRBC: 0 % (ref 0.0–0.2)

## 2023-06-22 LAB — HEPARIN LEVEL (UNFRACTIONATED): Heparin Unfractionated: 0.49 [IU]/mL (ref 0.30–0.70)

## 2023-06-22 LAB — PROTIME-INR
INR: 2.2 — ABNORMAL HIGH (ref 0.8–1.2)
Prothrombin Time: 24.6 s — ABNORMAL HIGH (ref 11.4–15.2)

## 2023-06-22 MED ORDER — WARFARIN SODIUM 6 MG PO TABS
6.0000 mg | ORAL_TABLET | Freq: Once | ORAL | Status: AC
  Administered 2023-06-22: 6 mg via ORAL
  Filled 2023-06-22: qty 1

## 2023-06-22 MED ORDER — OXYCODONE HCL 5 MG PO TABS
10.0000 mg | ORAL_TABLET | ORAL | Status: DC | PRN
  Administered 2023-06-22 – 2023-06-23 (×2): 10 mg via ORAL
  Administered 2023-06-23: 15 mg via ORAL
  Administered 2023-06-23: 10 mg via ORAL
  Filled 2023-06-22: qty 2
  Filled 2023-06-22: qty 3
  Filled 2023-06-22 (×2): qty 2

## 2023-06-22 MED ORDER — ACETAMINOPHEN 500 MG PO TABS
1000.0000 mg | ORAL_TABLET | Freq: Three times a day (TID) | ORAL | Status: DC
  Administered 2023-06-22 – 2023-06-23 (×3): 1000 mg via ORAL
  Filled 2023-06-22 (×3): qty 2

## 2023-06-22 MED ORDER — ACETAMINOPHEN 325 MG PO TABS: 650.0000 mg | ORAL_TABLET | Freq: Four times a day (QID) | ORAL | Status: DC

## 2023-06-22 NOTE — Progress Notes (Signed)
PT SEEN/EXAMINED PT EATING DINNER LUE: SPLINT IN PLACE WIGGLES FINGERS  CONTINUE NWB LUE OK TO GO HOME ON ORAL PAIN MEDS-SHORT COURSE OF ORAL DILAUDID WILL NEED TO SEE IN OFFICE NEXT TUESDAY CONTINUE WITH CURRENT SPLINT

## 2023-06-22 NOTE — Progress Notes (Signed)
Physical Therapy Treatment Patient Details Name: [name] MRN: [id number] DOB: 18-Feb-1956 Today's Date: 06/22/2023   History of Present Illness 67 y.o. female who presented to emergency department for evaluation for fall at home.   S/p ORIF of L radius and 3rd [facility] w/ hardware removal on 11/13. 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 Introduced Comptroller to pt today and began to train with it. Can also try quad cane to see which works better for her. Pt initially had difficulty with HW due to it being a new skill. Needed mod A due to posterior LOB. Pt ambulated 10' with mod A and reported light headedness, seated rest break taken and then pt ambulated again. BP stable at 109/52 before ambulation 109/54 after. Patient will benefit from intensive inpatient follow up therapy, >3 hours/day. PT will continue to follow.     If plan is discharge home, recommend the following: Assistance with cooking/housework;Assist for transportation;Help with stairs or ramp for entrance;A lot of help with walking and/or transfers;A little help with bathing/dressing/bathroom   Can travel by private vehicle        Equipment Recommendations  None recommended by PT (will continue to assess)    Recommendations for Other Services Rehab consult     Precautions / Restrictions Precautions Precautions: Fall Required Braces or Orthoses: Splint/Cast;Sling Splint/Cast: LUE sugartong splint in place Restrictions Weight Bearing Restrictions: Yes LUE Weight Bearing: Non weight bearing Other  Position/Activity Restrictions: No specific order written but based on fxs.     Mobility  Bed Mobility Overal bed mobility: Needs Assistance Bed Mobility: Supine to Sit     Supine to sit: Contact guard, HOB elevated     General bed mobility comments: CGA for safety w/ HOB elevated, increased time needed    Transfers Overall transfer level: Needs assistance Equipment used: Hemi-walker Transfers: Sit to/from Stand Sit to Stand: Min assist           General transfer comment: min A from bed, recliner, and small chair in room, each time pt initially unsteady with posterior lean, needed increased time in static standing before beginning to ambulate    Ambulation/Gait Ambulation/Gait assistance: Mod assist, Min assist Gait Distance (Feet): 23 Feet (3', 10', 10) Assistive device: Hemi-walker Gait Pattern/deviations: Step-to pattern, Shuffle, Leaning posteriorly, Staggering left, Staggering right Gait velocity: dec Gait velocity interpretation: <1.31 ft/sec, indicative of household ambulator   General Gait Details: introduced pt to [employer] today and began training. Also discussed quad cane which we can try at some point and see which gives her more stability. Pt with asymmetric gait pattern keeping RLE in front of L and leaning posterior. C/o light headedness so seated rest break taken after 10' before continuing with gait. BP stable 109/52 in sitting, 109/54 in standing   Stairs             Wheelchair Mobility     Tilt Bed    Modified Rankin (Stroke Patients Only)       Balance Overall balance assessment: Needs assistance Sitting-balance support: Single extremity supported Sitting balance-Leahy Scale: Fair   Postural control: Posterior lean Standing balance support: Single extremity  supported, During functional activity Standing balance-Leahy Scale: Poor Standing balance comment: reliant on UE support for stability. LOB with steps                             Cognition Arousal: Alert Behavior During Therapy: WFL for tasks assessed/performed Overall Cognitive Status: Within Functional Limits for tasks assessed                                          Exercises      General Comments General comments (skin integrity, edema, etc.): VSS on RA      Pertinent Vitals/Pain Pain Assessment Pain Assessment: Faces Faces Pain Scale: Hurts whole lot Pain Location: left arm and left hand Pain Descriptors / Indicators: Aching, Discomfort, Grimacing Pain Intervention(s): Limited activity within patient's tolerance, Monitored during session    Home Living                          Prior Function            PT Goals (current goals can now be found in the care plan section) Acute Rehab PT Goals Patient Stated Goal: back to my PLOF PT Goal Formulation: With patient Time For Goal Achievement: 06/22/23 Potential to Achieve Goals: Good Progress towards PT goals: Progressing toward goals    Frequency    Min 1X/week      PT Plan      Co-evaluation              AM-PAC PT "6 Clicks" Mobility   Outcome Measure  Help needed turning from your back to your side while in a flat bed without using bedrails?: A Little Help needed moving from lying on your back to sitting on the side of a flat bed without using bedrails?: A Little Help needed moving to and from a bed to a chair (including a wheelchair)?: A Lot Help needed standing up from a chair using your arms (e.g., wheelchair or bedside chair)?: A Lot Help needed to walk in hospital room?: Total Help needed climbing 3-5 steps with a railing? : Total 6 Click Score: 12    End of Session Equipment Utilized During Treatment: Gait belt Activity Tolerance: Patient limited by pain;Other (comment) (mild dizziness , BP stable) Patient left: with call bell/phone within reach;in chair;with chair alarm set Nurse Communication: Mobility status (need for  increased support while ambulating) PT Visit Diagnosis: Unsteadiness on feet (R26.81);Other abnormalities of gait and mobility (R26.89);Pain Pain - Right/Left: Left Pain - part of body: Hand;Hip     Time: 3244-0102 PT Time Calculation (min) (ACUTE ONLY): 35 min  Charges:    $Gait Training: 8-22 mins $Therapeutic Activity: 8-22 mins PT General Charges $$ ACUTE PT VISIT: 1 Visit                     Lyanne Co, PT  Acute Rehab Services Secure chat preferred Office [phone]    Lawana Chambers Porfiria Heinrich 06/22/2023, 1:19 PM

## 2023-06-22 NOTE — Progress Notes (Signed)
PHARMACY - ANTICOAGULATION CONSULT NOTE  Pharmacy Consult for heparin / warfarin Indication: atrial fibrillation and chronic lower extremity DVT  Patient Measurements: Height: 5' 6.5" (168.9 cm) Weight: 66.5 kg (146 lb 9.7 oz) IBW/kg (Calculated) : 60.45 Heparin Dosing Weight: 58 Kg  Vital Signs: Temp: 98.1 F (36.7 C) (11/18 0756) Temp Source: Oral (11/18 0756) BP: 104/51 (11/18 0819) Pulse Rate: 63 (11/18 0756)  Labs: Recent Labs    06/20/23 0537 06/21/23 0625 06/22/23 0832  HGB 10.4* 11.4* 10.9*  HCT 31.4* 34.9* 34.0*  PLT 91* 105* 109*  LABPROT 17.6* 19.9* 24.6*  INR 1.4* 1.7* 2.2*  HEPARINUNFRC 0.52 0.59 0.49  CREATININE  --  0.79  --     Estimated Creatinine Clearance: 67 mL/min (by C-G formula based on SCr of 0.79 mg/dL).   Assessment: 31 yoF here with fall at home, on warfarin PTA for atrial fibrillation and history of DVT.  History of DVT of bilateral lower extremities status post IVC filter in March 2022 and acute development of right upper extremity DVT in July 2024.  INR 2.5 on arrival and vitamin K administered for possible emergent surgery. S/p repair of wrist fracture on 11/13 - to resume warfarin and heparin.  11/18 Assessment: Heparin level 0.49 this morning, in therapeutic range on 1150 units/hour.  INR is 2.2 , therapeutic INR>  PTA warfarin dose was 6 mg daily for DVT hx (failed apixaban). Hgb  stable in 10s-11s,  platelets 105> 109k, mild, self limiting, Bleeding noted from L forearm 11/16.  No other active bleeding reported currently.   06/16/23: Bilateral extremity venous Dopplers negative for DVT.    Goal of Therapy:  INR 2-3 Heparin level 0.3-0.7 units/ml Monitor platelets by anticoagulation protocol: Yes   Plan:  Continue heparin drip at 1150 units/hr Warfarin 6 mg po x 1 dose today  Daily heparin level, INR, CBC Monitor for s/sx of bleeding   Thank you for involving pharmacy in this patient's care.  Noah Delaine, RPh Clinical  Pharmacist  Please check AMION for all [facility] Pharmacy phone numbers After 10:00 PM, call Main Pharmacy [phone]  06/22/2023 1:06 PM

## 2023-06-22 NOTE — Discharge Instructions (Signed)
KEEP BANDAGE CLEAN AND DRY °CALL OFFICE FOR F/U APPT [phone] °KEEP HAND ELEVATED ABOVE HEART °OK TO APPLY ICE TO OPERATIVE AREA °CONTACT OFFICE IF ANY WORSENING PAIN OR CONCERNS. °

## 2023-06-22 NOTE — Progress Notes (Signed)
Occupational Therapy Treatment Patient Details Name: [name] MRN: [id number] DOB: Jan 28, 1956 Today's Date: 06/22/2023   History of present illness 67 y.o. female who presented to emergency department for evaluation for fall at home.   S/p ORIF of L radius and 3rd [facility] w/ hardware removal on 11/13. 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 Patient able to walk to bathroom with Hemi-walker and min assist with assistance managing walker and cues for safety. Patient able to stand at sink for grooming tasks with min assist. Patient able to transfer to regular toilet with hemi-walker and min assist and able to perform hygiene seated. Patient left in recliner with LUE elevated. Acute OT to continue to follow to address grooming, bathing, dressing, and toilet transfers.       If plan is discharge home, recommend the following:  A little help with walking and/or transfers;A lot of help with bathing/dressing/bathroom;Help with stairs or ramp for entrance;Assist for transportation;Assistance with cooking/housework   Equipment Recommendations  None recommended by OT    Recommendations for Other Services      Precautions / Restrictions Precautions Precautions: Fall Required Braces or Orthoses: Splint/Cast;Sling Splint/Cast: LUE sugartong splint in place Restrictions Weight Bearing Restrictions: Yes LUE Weight Bearing: Non weight bearing Other Position/Activity Restrictions: No specific order written but based on fxs.       Mobility Bed Mobility Overal  bed mobility: Needs Assistance             General bed mobility comments: OOB in recliner    Transfers Overall transfer level: Needs assistance Equipment used: Hemi-walker Transfers: Sit to/from Stand Sit to Stand: Min assist           General transfer comment: transfers to toilet and recliner with min assist and assistance with managing hemi-walker     Balance Overall balance assessment: Needs assistance Sitting-balance support: Single extremity supported Sitting balance-Leahy Scale: Fair   Postural control: Posterior lean Standing balance support: Single extremity supported, During functional activity Standing balance-Leahy Scale: Poor Standing balance comment: able to stand at sink for grooming tasks with sink for support                           ADL either performed or assessed with clinical judgement   ADL Overall ADL's : Needs assistance/impaired     Grooming: Wash/dry hands;Wash/dry face;Oral care;Minimal assistance;Standing Grooming Details (indicate cue type and reason): assistance with toothpaste                 Toilet Transfer: Minimal assistance;Ambulation;Regular Teacher, adult education Details (indicate cue type and reason): ambualted to toilet with hemi walker and min assist due to assistance to lower to toilet Toileting- Clothing Manipulation and Hygiene: Supervision/safety;Sitting/lateral lean Toileting - Clothing Manipulation Details (indicate cue type and reason): able to perform toilet hygiene seated            Extremity/Trunk Assessment              Vision       Perception     Praxis      Cognition Arousal: Alert Behavior During Therapy: [facility]  for tasks assessed/performed Overall Cognitive Status: Within Functional Limits for tasks assessed                                          Exercises      Shoulder Instructions       General Comments VSS on RA    Pertinent Vitals/ Pain        Pain Assessment Pain Assessment: Faces Faces Pain Scale: Hurts even more Pain Location: left arm and left hand Pain Descriptors / Indicators: Aching, Discomfort, Grimacing Pain Intervention(s): Limited activity within patient's tolerance, Monitored during session, Repositioned, RN gave pain meds during session  Home Living                                          Prior Functioning/Environment              Frequency  Min 1X/week        Progress Toward Goals  OT Goals(current goals can now be found in the care plan section)  Progress towards OT goals: Progressing toward goals  Acute Rehab OT Goals Patient Stated Goal: less pain OT Goal Formulation: With patient Time For Goal Achievement: 06/29/23 Potential to Achieve Goals: Good ADL Goals Pt Will Perform Grooming: with supervision;standing Pt Will Perform Lower Body Bathing: sit to/from stand;with contact guard assist Pt Will Perform Lower Body Dressing: with min assist;sit to/from stand Pt Will Transfer to Toilet: with contact guard assist;ambulating Pt Will Perform Toileting - Clothing Manipulation and hygiene: with contact guard assist;sit to/from stand Pt Will Perform Tub/Shower Transfer: with contact guard assist;Shower transfer;ambulating;shower seat  Plan      Co-evaluation                 AM-PAC OT "6 Clicks" Daily Activity     Outcome Measure   Help from another person eating meals?: A Little Help from another person taking care of personal grooming?: A Little Help from another person toileting, which includes using toliet, bedpan, or urinal?: A Lot Help from another person bathing (including washing, rinsing, drying)?: A Lot Help from another person to put on and taking off regular upper body clothing?: A Lot Help from another person to put on and taking off regular lower body clothing?: A Lot 6 Click Score: 14    End of Session Equipment Utilized During Treatment: Gait  belt;Other (comment) (Hemi-walker)  OT Visit Diagnosis: Unsteadiness on feet (R26.81);Other abnormalities of gait and mobility (R26.89);Repeated falls (R29.6);Pain Pain - Right/Left: Left Pain - part of body: Arm   Activity Tolerance Patient tolerated treatment well   Patient Left in chair;with call bell/phone within reach;with chair alarm set;with nursing/sitter in room   Nurse Communication Mobility status        Time: 1610-9604 OT Time Calculation (min): 39 min  Charges: OT General Charges $OT Visit: 1 Visit OT Treatments $Self Care/Home Management : 38-52 mins  Alfonse Flavors, OTA Acute Rehabilitation Services  Office [phone]   Dewain Penning 06/22/2023, 2:23 PM

## 2023-06-22 NOTE — Plan of Care (Signed)
  Problem: Clinical Measurements: Goal: Ability to maintain clinical measurements within normal limits will improve Outcome: Progressing   Problem: Activity: Goal: Risk for activity intolerance will decrease Outcome: Progressing   Problem: Nutrition: Goal: Adequate nutrition will be maintained Outcome: Progressing   Problem: Coping: Goal: Level of anxiety will decrease Outcome: Progressing   Problem: Elimination: Goal: Will not experience complications related to bowel motility Outcome: Progressing Goal: Will not experience complications related to urinary retention Outcome: Progressing   Problem: Pain Management: Goal: General experience of comfort will improve Outcome: Progressing   Problem: Safety: Goal: Ability to remain free from injury will improve Outcome: Progressing   Problem: Skin Integrity: Goal: Risk for impaired skin integrity will decrease Outcome: Progressing
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
## 2023-06-22 NOTE — Progress Notes (Signed)
Notified Charma Igo, PA of pt wanting to see an orthopedic provider about her arm and c/o brace discomfort to her elbow. No response but PA read message.

## 2023-06-22 NOTE — Plan of Care (Signed)

## 2023-06-22 NOTE — Progress Notes (Signed)
Patient ID: [name], female   DOB: 1955-09-15, 67 y.o.   MRN: [id number]   LOS: 7 days   Subjective: Continues to c/o severe pain though demeanor doesn't support that. Currently calm and working with PT. Continues to have hand pain but now also elbow pain extending proximally to shoulder.   Objective: Vital signs in last 24 hours: Temp:  [97.9 F (36.6 C)-98.4 F (36.9 C)] 98.1 F (36.7 C) (11/18 0756) Pulse Rate:  [63-80] 63 (11/18 0756) Resp:  [15-18] 18 (11/18 0756) BP: (88-105)/(48-62) 104/51 (11/18 0819) SpO2:  [94 %-99 %] 94 % (11/18 0756) Last BM Date : 06/21/23   Laboratory  CBC Recent Labs    06/21/23 0625 06/22/23 0832  WBC 2.5* 2.6*  HGB 11.4* 10.9*  HCT 34.9* 34.0*  PLT 105* 109*   BMET Recent Labs    06/21/23 0625  NA 140  K 4.0  CL 104  CO2 28  GLUCOSE 92  BUN 6*  CREATININE 0.79  CALCIUM 9.3     Physical Exam General appearance: alert and no distress LUE -- Tolerates palpation of palm and fingers (improvement over Friday). Proximal FA soft and splint loose at elbow.   Assessment/Plan: S/p ORIF left radius and 3rd [facility] w/hardware removal -- Exam not concerning. Would continue to titrate up neurontin. Will increase oxycodone to try and reduce dependence on Dilaudid.   Freeman Caldron, PA-C Orthopedic Surgery [phone] 06/22/2023

## 2023-06-23 ENCOUNTER — Other Ambulatory Visit (HOSPITAL_COMMUNITY): Payer: Self-pay

## 2023-06-23 DIAGNOSIS — S52502D Unspecified fracture of the lower end of left radius, subsequent encounter for closed fracture with routine healing: Secondary | ICD-10-CM | POA: Diagnosis not present

## 2023-06-23 LAB — CBC
HCT: 32.8 % — ABNORMAL LOW (ref 36.0–46.0)
Hemoglobin: 10.5 g/dL — ABNORMAL LOW (ref 12.0–15.0)
MCH: 33.3 pg (ref 26.0–34.0)
MCHC: 32 g/dL (ref 30.0–36.0)
MCV: 104.1 fL — ABNORMAL HIGH (ref 80.0–100.0)
Platelets: 123 10*3/uL — ABNORMAL LOW (ref 150–400)
RBC: 3.15 MIL/uL — ABNORMAL LOW (ref 3.87–5.11)
RDW: 13.1 % (ref 11.5–15.5)
WBC: 2.8 10*3/uL — ABNORMAL LOW (ref 4.0–10.5)
nRBC: 0 % (ref 0.0–0.2)

## 2023-06-23 LAB — PROTIME-INR
INR: 2 — ABNORMAL HIGH (ref 0.8–1.2)
Prothrombin Time: 23.1 s — ABNORMAL HIGH (ref 11.4–15.2)

## 2023-06-23 LAB — HEPARIN LEVEL (UNFRACTIONATED): Heparin Unfractionated: 0.45 [IU]/mL (ref 0.30–0.70)

## 2023-06-23 MED ORDER — WARFARIN SODIUM 6 MG PO TABS
6.0000 mg | ORAL_TABLET | Freq: Once | ORAL | Status: AC
  Administered 2023-06-23: 6 mg via ORAL
  Filled 2023-06-23: qty 1

## 2023-06-23 MED ORDER — SENNOSIDES-DOCUSATE SODIUM 8.6-50 MG PO TABS: 2.0000 | ORAL_TABLET | Freq: Two times a day (BID) | ORAL | 0 refills | Status: DC

## 2023-06-23 MED ORDER — ACETAMINOPHEN 325 MG PO TABS: 650.0000 mg | ORAL_TABLET | ORAL | Status: AC | PRN

## 2023-06-23 MED ORDER — GABAPENTIN 300 MG PO CAPS: 300.0000 mg | ORAL_CAPSULE | Freq: Two times a day (BID) | ORAL | 0 refills | Status: DC

## 2023-06-23 MED ORDER — POLYETHYLENE GLYCOL 3350 17 G PO PACK: 17.0000 g | PACK | Freq: Two times a day (BID) | ORAL | 0 refills | Status: DC

## 2023-06-23 MED ORDER — OXYCODONE HCL 10 MG PO TABS: 10.0000 mg | ORAL_TABLET | ORAL | 0 refills | Status: DC | PRN

## 2023-06-23 NOTE — Progress Notes (Signed)
PHARMACY - ANTICOAGULATION CONSULT NOTE  Pharmacy Consult for heparin / warfarin Indication: atrial fibrillation and chronic lower extremity DVT  Patient Measurements: Height: 5' 6.5" (168.9 cm) Weight: 66.5 kg (146 lb 9.7 oz) IBW/kg (Calculated) : 60.45 Heparin Dosing Weight: 58 Kg  Vital Signs: Temp: 98.4 F (36.9 C) (11/19 0743) Temp Source: Oral (11/19 0743) BP: 103/68 (11/19 0743) Pulse Rate: 78 (11/19 0743)  Labs: Recent Labs    06/21/23 0625 06/22/23 0832 06/23/23 0758  HGB 11.4* 10.9* 10.5*  HCT 34.9* 34.0* 32.8*  PLT 105* 109* 123*  LABPROT 19.9* 24.6* 23.1*  INR 1.7* 2.2* 2.0*  HEPARINUNFRC 0.59 0.49 0.45  CREATININE 0.79  --   --     Estimated Creatinine Clearance: 67 mL/min (by C-G formula based on SCr of 0.79 mg/dL).   Assessment: 32 yoF here with fall at home, on warfarin PTA for atrial fibrillation and history of DVT.  History of DVT of bilateral lower extremities status post IVC filter in March 2022 and acute development of right upper extremity DVT in July 2024.  INR 2.5 on arrival and vitamin K administered for possible emergent surgery. S/p repair of wrist fracture on 11/13 - to resume warfarin and heparin.  06/16/23: Bilateral extremity venous Dopplers negative for DVT.   11/19 Assessment: Heparin level 0.45 this morning, remains in therapeutic range on 1150 units/hour.  INR is 2.0 , therapeutic INR>  PTA warfarin dose was 6 mg daily for DVT hx (failed apixaban). Hgb stable in 10s,  platelets low 100s stable. Bleeding noted from L forearm 11/16.  No other active bleeding reported currently.    Goal of Therapy:  INR 2-3 Heparin level 0.3-0.7 units/ml Monitor platelets by anticoagulation protocol: Yes   Plan:  Continue heparin drip at 1150 units/hr Warfarin 6 mg po x 1 dose today  Daily heparin level, INR, CBC Monitor for s/sx of bleeding   Thank you for involving pharmacy in this patient's care.  Noah Delaine, RPh Clinical  Pharmacist  Please check AMION for all [facility] Pharmacy phone numbers After 10:00 PM, call Main Pharmacy [phone]  06/23/2023 12:52 PM

## 2023-06-23 NOTE — Care Management Important Message (Signed)
Important Message  Patient Details  Name: [name] MRN: [id number] Date of Birth: 1955-10-03   Important Message Given:  Yes - Medicare IM     Sherilyn Banker 06/23/2023, 2:03 PM

## 2023-06-23 NOTE — Progress Notes (Signed)
Physical Therapy Treatment Patient Details Name: [name] MRN: [id number] DOB: 05/22/1956 Today's Date: 06/23/2023   History of Present Illness 67 y.o. female who presented to emergency department for evaluation for fall at home.   S/p ORIF of L radius and 3rd [facility] w/ hardware removal on 11/13. PMHx: invasive ductal carcinoma of left breast, chronic thrombocytopenia, paroxysmal atrial fibrillation, essential hypertension, hyperlipidemia, GERD, chronic B LE DVT status post IVC, R UE axillary DVT in 02/21/2023 started on anticoagulation Eliquis then warfarin, chronic pain, COPD, generalized anxiety disorder, history of intracranial bleed, stress incontinence, seizure disorder, neuropathy, a-fib    PT Comments  Pt received sitting in the recliner and agreeable to session focused on transfer and gait training with a quad cane. Pt continues to demonstrate posterior LOB requiring increased cues and intermittent assist to correct. Pt able to demonstrate improved sequencing and stability using a quad cane with dense cues. Pt able to tolerate increased gait distance despite pt reporting mild dizziness. Pt continues to be limited by pain and impaired balance. Pt continues to benefit from PT services to progress toward functional mobility goals.    If plan is discharge home, recommend the following: Assistance with cooking/housework;Assist for transportation;Help with stairs or ramp for entrance;A lot of help with walking and/or transfers;A little help with bathing/dressing/bathroom   Can travel by private vehicle        Equipment Recommendations  None recommended by PT    Recommendations for Other Services       Precautions / Restrictions Precautions Precautions: Fall Required Braces or Orthoses: Splint/Cast;Sling Splint/Cast: LUE sugartong splint in place Restrictions Weight Bearing Restrictions: Yes LUE Weight Bearing: Non weight bearing     Mobility  Bed Mobility                General bed mobility comments: Pt up in recliner upon arrival    Transfers Overall transfer level: Needs assistance Equipment used: Quad cane Transfers: Sit to/from Stand Sit to Stand: Min assist           General transfer comment: minA for anterior weight shift and cues for hand placement    Ambulation/Gait Ambulation/Gait assistance: Min assist, Contact guard assist Gait Distance (Feet): 50 Feet Assistive device: Quad cane Gait Pattern/deviations: Step-to pattern, Shuffle, Leaning posteriorly, Step-through pattern, Narrow base of support Gait velocity: dec     General Gait Details: Pt instructed on sequencing with quad cane requiring frequent cues. pt demonstrating increased posterior bias requiring assist to correct, however pt improved with cues not to step past cane and for sequencing       Balance Overall balance assessment: Needs assistance Sitting-balance support: Single extremity supported Sitting balance-Leahy Scale: Fair     Standing balance support: Single extremity supported, During functional activity, Reliant on assistive device for balance Standing balance-Leahy Scale: Poor Standing balance comment: with quad cane and intermittent min A due to posterior LOB                            Cognition Arousal: Alert Behavior During Therapy: WFL for tasks assessed/performed Overall Cognitive Status: Within Functional Limits for tasks assessed                                          Exercises      General Comments General comments (skin integrity, edema, etc.): Pt  reporting dizziness in standing that slightly improved with progressed distance      Pertinent Vitals/Pain Pain Assessment Pain Assessment: Faces Faces Pain Scale: Hurts little more Pain Location: LUE Pain Descriptors / Indicators: Aching, Discomfort, Grimacing Pain Intervention(s): Limited activity within patient's tolerance, Monitored during session,  Repositioned     PT Goals (current goals can now be found in the care plan section) Acute Rehab PT Goals Patient Stated Goal: back to my PLOF PT Goal Formulation: With patient Time For Goal Achievement: 06/22/23 Progress towards PT goals: Progressing toward goals    Frequency    Min 1X/week       AM-PAC PT "6 Clicks" Mobility   Outcome Measure  Help needed turning from your back to your side while in a flat bed without using bedrails?: A Little Help needed moving from lying on your back to sitting on the side of a flat bed without using bedrails?: A Little Help needed moving to and from a bed to a chair (including a wheelchair)?: A Little Help needed standing up from a chair using your arms (e.g., wheelchair or bedside chair)?: A Little Help needed to walk in hospital room?: A Lot (dense cues) Help needed climbing 3-5 steps with a railing? : Total 6 Click Score: 15    End of Session Equipment Utilized During Treatment: Gait belt;Other (comment) (LUE sling) Activity Tolerance: Patient limited by pain;Other (comment);Patient limited by fatigue (mild dizziness) Patient left: with call bell/phone within reach;in chair;with chair alarm set Nurse Communication: Mobility status PT Visit Diagnosis: Unsteadiness on feet (R26.81);Other abnormalities of gait and mobility (R26.89);Pain     Time: 1610-9604 PT Time Calculation (min) (ACUTE ONLY): 21 min  Charges:    $Gait Training: 8-22 mins PT General Charges $$ ACUTE PT VISIT: 1 Visit                     Johny Shock, PTA Acute Rehabilitation Services Secure Chat Preferred  [phone]    Johny Shock 06/23/2023, 1:20 PM

## 2023-06-23 NOTE — TOC Transition Note (Signed)
Transition of Care [facility]) - CM/SW Discharge Note   Patient Details  Name: [name] MRN: [id number] Date of Birth: [birth year]  [facility] of [facility] CM/SW Contact:  Epifanio Lesches, RN Phone Number: 06/23/2023, 2:42 PM   Clinical Narrative:    Patient will DC to: Home Anticipated DC date: 06/23/2023 Family notified: yes Transport by: car    - S/p ORIF of L radius and 3rd [facility] w/ hardware removal on 11/13  Per MD patient ready for DC today. RN, patient,  and patient's daughter  notified of DC. Orders noted for home health services and DME. Pt agreeable. Pt without provider preference. Referral made with [facility] and accepted. Referral made with [employer] for QUAD cane. Equipment will be delivered to bedside prior to d/c. Pt to pick up RX meds from local pharmacy. Post hospital f/u noted on AVS. Daughter to provide transportation to home.  RNCM will sign off for now as intervention is no longer needed. Please consult [place] again if new needs arise.    Final next level of care: Home/Self Care Barriers to Discharge: No Barriers Identified   Patient Goals and CMS Choice   Choice offered to / list presented to : Patient  Discharge Placement                         Discharge Plan and Services Additional resources added to the After Visit Summary for                  DME Arranged: Other see comment (quad cane) DME Agency: [employer] Date DME Agency Contacted: 06/23/23 Time DME Agency Contacted: [phone] Representative spoke with at DME Agency: Ian Malkin HH Arranged: PT, OT HH Agency: [facility] Date [facility] Agency Contacted: 06/23/23 Time HH Agency Contacted: [phone] Representative spoke with at [facility] Agency: [place]  Social Determinants of Health (SDOH) Interventions SDOH Screenings   Food Insecurity: No Food Insecurity (06/15/2023)  Housing: Low Risk  (06/15/2023)  Transportation Needs: No Transportation Needs (06/15/2023)  Utilities: Not At Risk  (06/15/2023)  Alcohol Screen: Low Risk  (03/11/2023)  Depression (PHQ2-9): Low Risk  (03/11/2023)  Financial Resource Strain: Low Risk  (12/31/2022)  Tobacco Use: Medium Risk (06/17/2023)     Readmission Risk Interventions     No data to display

## 2023-06-23 NOTE — Discharge Summary (Signed)
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 spine and bilateral hips. An IVC filter is present. IMPRESSION: No acute fracture or dislocation. Electronically Signed   By: Thornell Sartorius M.D.   On: 06/15/2023 02:12   DG Hand Complete Left  Result Date: 06/15/2023 CLINICAL DATA:  fracture.  Fall EXAM: LEFT HAND - COMPLETE 3+ VIEW COMPARISON:  X-ray left forearm 06/14/2023 FINDINGS: Acute displaced periprosthetic distal radial fracture along the proximal aspect of a prior plate and screw fixation. Distal volar ulnar dislocation. Acute displaced diagonal fracture of the third digit metacarpa with possible intra-articular extension to the base. IMPRESSION: 1. Acute displaced periprosthetic distal radial fracture along the proximal aspect of a prior plate and screw fixation. 2. Distal volar ulnar dislocation. 3. Acute displaced diagonal fracture of the third digit metacarpal body with possible intra-articular extension to the base. Electronically Signed   By:  Tish Frederickson M.D.   On: 06/15/2023 00:55   CT Head Wo Contrast  Result Date: 06/14/2023 CLINICAL DATA:  Fall, on blood thinners EXAM: CT HEAD WITHOUT CONTRAST CT CERVICAL SPINE WITHOUT CONTRAST TECHNIQUE: Multidetector CT imaging of the head and cervical spine was performed following the standard protocol without intravenous contrast. Multiplanar CT image reconstructions of the cervical spine were also generated. RADIATION DOSE REDUCTION: This exam was performed according to the departmental dose-optimization program which includes automated exposure control, adjustment of the mA and/or kV according to patient size and/or use of iterative reconstruction technique. COMPARISON:  03/25/2023 CT head and cervical spine FINDINGS: CT HEAD FINDINGS Brain: No evidence of acute infarct, hemorrhage, mass, mass  effect, or midline shift. No hydrocephalus or extra-axial fluid collection. Redemonstrated sequela of prior right parietooccipital craniotomy and mass resection. Vascular: No hyperdense vessel. Atherosclerotic calcifications in the intracranial carotid and vertebral arteries. Skull: Negative for fracture or focal lesion. Sinuses/Orbits: No acute finding. CT CERVICAL SPINE FINDINGS Alignment: No traumatic listhesis. Skull base and vertebrae: No acute fracture or suspicious osseous lesion. Soft tissues and spinal canal: No prevertebral fluid or swelling. No visible canal hematoma. Disc levels: Degenerative changes in the cervical spine.No high-grade spinal canal stenosis. Upper chest: No focal pulmonary opacity or pleural effusion. IMPRESSION: 1. No acute intracranial process. 2. No acute fracture or traumatic listhesis in the cervical spine. Electronically Signed   By: Wiliam Ke M.D.   On: 06/14/2023 23:38   CT Cervical Spine Wo Contrast  Result Date: 06/14/2023 CLINICAL DATA:  Fall, on blood thinners EXAM: CT HEAD WITHOUT CONTRAST CT CERVICAL SPINE WITHOUT CONTRAST TECHNIQUE: Multidetector CT imaging of  the head and cervical spine was performed following the standard protocol without intravenous contrast. Multiplanar CT image reconstructions of the cervical spine were also generated. RADIATION DOSE REDUCTION: This exam was performed according to the departmental dose-optimization program which includes automated exposure control, adjustment of the mA and/or kV according to patient size and/or use of iterative reconstruction technique. COMPARISON:  03/25/2023 CT head and cervical spine FINDINGS: CT HEAD FINDINGS Brain: No evidence of acute infarct, hemorrhage, mass, mass effect, or midline shift. No hydrocephalus or extra-axial fluid collection. Redemonstrated sequela of prior right parietooccipital craniotomy and mass resection. Vascular: No hyperdense vessel. Atherosclerotic calcifications in the intracranial carotid and vertebral arteries. Skull: Negative for fracture or focal lesion. Sinuses/Orbits: No acute finding. CT CERVICAL SPINE FINDINGS Alignment: No traumatic listhesis. Skull base and vertebrae: No acute fracture or suspicious osseous lesion. Soft tissues and spinal canal: No prevertebral fluid or swelling. No visible canal hematoma. Disc levels: Degenerative changes in the cervical spine.No high-grade spinal canal stenosis. Upper chest: No focal pulmonary opacity or pleural effusion. IMPRESSION: 1. No acute intracranial process. 2. No acute fracture or traumatic listhesis in the cervical spine. Electronically Signed   By: Wiliam Ke M.D.   On: 06/14/2023 23:38   DG Wrist Complete Left  Result Date: 06/14/2023 CLINICAL DATA:  Status post fall. EXAM: LEFT WRIST - COMPLETE 3+ VIEW COMPARISON:  None Available. FINDINGS: A radiopaque fixation plate and screws are seen along the distal left radius. Acute, displaced fracture of the distal left radial shaft is also seen with approximately 1 shaft width dorsal displacement of the distal fracture site. A displaced fracture fragment of unclear origin is  seen adjacent to the proximal portions of the left lunate bone and left triquetrum bone. An additional nondisplaced fracture is seen involving the proximal to midportion of the third left metacarpal. Moderate severity soft tissue swelling is seen along the previously noted fracture sites. IMPRESSION: 1. Acute, displaced fracture of the distal left radial shaft. 2. Displaced fracture fragment of unclear origin adjacent to the proximal portions of the left lunate bone and left triquetrum bone. CT correlation is recommended. 3. Nondisplaced fracture of the third left metacarpal. Electronically Signed   By: Aram Candela M.D.   On: 06/14/2023 23:18   DG Forearm Left  Result Date: 06/14/2023 CLINICAL DATA:  Status post fall. EXAM: LEFT FOREARM - 2 VIEW COMPARISON:  None Available. FINDINGS: A radiopaque fixation plate and screws are seen along the distal aspect of the left radius. An acute fracture of the distal left radial shaft is  also seen, with approximately 1 shaft width dorsal displacement of the distal fracture site. The distal left ulna is limited in evaluation secondary to multiple overlapping osseous structures. An 11 mm x 8 mm cortical fragment of unclear origin is seen proximal to the left lunate and left triquetrum bones. A nondisplaced fracture of the proximal to mid portion of the third left metacarpal is noted. Moderate severity soft tissue swelling is seen adjacent to the previously noted fracture site. IMPRESSION: 1. Acute fracture of the distal left radial shaft. 2. 11 mm x 8 mm cortical fragment of unclear origin proximal to the left lunate and left triquetrum bones. CT correlation is recommended. 3. Acute fracture of the third left metacarpal. Electronically Signed   By: Aram Candela M.D.   On: 06/14/2023 23:15   DG Elbow Complete Left  Result Date: 06/14/2023 CLINICAL DATA:  Status post fall. EXAM: LEFT ELBOW - COMPLETE 3+ VIEW COMPARISON:  None Available. FINDINGS: There is no  evidence of fracture, dislocation, or joint effusion. There is no evidence of arthropathy or other focal bone abnormality. Soft tissues are unremarkable. IMPRESSION: Negative. Electronically Signed   By: Aram Candela M.D.   On: 06/14/2023 23:09   DG Shoulder Left  Result Date: 06/14/2023 CLINICAL DATA:  Status post fall. EXAM: LEFT SHOULDER - 2+ VIEW COMPARISON:  None Available. FINDINGS: A small area of cortical irregularity is seen along the inferior medial aspect of the left scapula. This is of indeterminate age. There is no evidence of dislocation. There is no evidence of significant arthropathy. Radiopaque surgical clips are seen overlying the left axilla and lateral aspect of the mid left chest wall. Soft tissues are otherwise unremarkable. IMPRESSION: Findings which may represent a small fracture involving the inferior medial aspect of the left scapula. CT correlation is recommended. Electronically Signed   By: Aram Candela M.D.   On: 06/14/2023 23:06      Subjective: Continues to report pain as "same as before" although as in the last several days, has not looked to be in signal recurrent pain, also not correlated with tachycardia etc.  For the first time I have seen her sitting up comfortably in chair.  No other complaints reported.  Discharge Exam:  Vitals:   06/22/23 1458 06/22/23 2100 06/23/23 0633 06/23/23 0743  BP: (!) 108/56 (!) 116/54 (!) 101/45 103/68  Pulse: 65 72 75 78  Resp: 18 18 18 18   Temp: 98.2 F (36.8 C) 97.6 F (36.4 C) 97.9 F (36.6 C) 98.4 F (36.9 C)  TempSrc: Oral Oral Oral Oral  SpO2: 95% 98% 97% 95%  Weight:      Height:        General exam: Middle-age female, moderately built and thinly nourished, sitting up in reclining chair.  Appears comfortable and in no distress.  Happy talking about her 2 cats and eager to return to her 2 grandkids. Respiratory system: Clear to auscultation.  No increased work of breathing Cardiovascular system: S1 &  S2 heard, RRR. No JVD, murmurs, rubs, gallops or clicks. No pedal edema.  Off of telemetry. Gastrointestinal system: Abdomen is nondistended, soft and nontender. No organomegaly or masses felt. Normal bowel sounds heard. Central nervous system: Alert and oriented. No focal neurological deficits. Extremities: Symmetric 5 x 5 power except.  Left forearm dressing clean, dry and intact.  No overt bleeding noted.  Dressing is not tight.  Now has a shoulder sling which should help.  Vascular bundle intact.  Fingers protruding out of  the splint have mild swelling and bruising which may be expected but swelling is better compared to before and she is able to wiggle her fingers well. Skin: No rashes, lesions or ulcers Psychiatry: Judgement and insight appear normal. Mood & affect pleasant and appropriate.    The results of significant diagnostics from this hospitalization (including imaging, microbiology, ancillary and laboratory) are listed below for reference.     Microbiology: Recent Results (from the past 240 hour(s))  Surgical PCR screen     Status: None   Collection Time: 06/15/23  7:27 PM   Specimen: Nasal Mucosa; Nasal Swab  Result Value Ref Range Status   MRSA, PCR NEGATIVE NEGATIVE Final   Staphylococcus aureus NEGATIVE NEGATIVE Final    Comment: (NOTE) The Xpert SA Assay (FDA approved for NASAL specimens in patients 1 years of age and older), is one component of a comprehensive surveillance program. It is not intended to diagnose infection nor to guide or monitor treatment. Performed at [facility], [address] [place], [place] [address]      Labs: CBC: Recent Labs  Lab 06/19/23 0708 06/20/23 0537 06/21/23 0625 06/22/23 0832 06/23/23 0758  WBC 2.8* 2.7* 2.5* 2.6* 2.8*  HGB 9.9* 10.4* 11.4* 10.9* 10.5*  HCT 31.3* 31.4* 34.9* 34.0* 32.8*  MCV 104.3* 100.3* 101.5* 103.7* 104.1*  PLT 87* 91* 105* 109* 123*    Basic Metabolic Panel: Recent Labs  Lab  06/17/23 0614 06/18/23 1006 06/19/23 0708 06/21/23 0625  NA 139 139 139 140  K 4.0 3.7 4.1 4.0  CL 105 107 104 104  CO2 26 26 27 28   GLUCOSE 85 90 95 92  BUN 10 11 8  6*  CREATININE 0.74 0.96 0.71 0.79  CALCIUM 8.6* 8.4* 8.6* 9.3    Liver Function Tests: Recent Labs  Lab 06/17/23 0614 06/18/23 1006 06/19/23 0708  AST 17 21 21   ALT 12 11 13   ALKPHOS 47 49 48  BILITOT 0.6 0.6 0.3  PROT 5.3* 5.4* 5.2*  ALBUMIN 2.9* 2.9* 2.9*     Time coordinating discharge: 45 minutes  SIGNED:  Marcellus Scott, MD,  FACP, [facility], [facility], [facility]   [facility] Phys[facility] Advisor [place]     To contact the attending provider between 7A-7P or the covering provider during after hours 7P-7A, please log into the web site [email or web address] and access using universal [place] password for that web site. If you do not have the password, please call the hospital operator.

## 2023-06-23 NOTE — Progress Notes (Signed)
Mobility Specialist: Progress Note   06/23/23 1555  Mobility  Activity Ambulated with assistance in hallway  Level of Assistance Minimal assist, patient does 75% or more  Assistive Device Four point cane  Distance Ambulated (ft) 50 ft  LUE Weight Bearing NWB  Activity Response Tolerated well  Mobility Referral Yes  $Mobility charge 1 Mobility  Mobility Specialist Start Time (ACUTE ONLY) 1419  Mobility Specialist Stop Time (ACUTE ONLY) 1439  Mobility Specialist Time Calculation (min) (ACUTE ONLY) 20 min    Pt was agreeable to mobility session - received in chair. MinA for STS and light minA for ambulation d/t a few instances of unsteadiness. It ambulating much better with the quad cane. No complaints. Returned to room without fault. Left in chair with all needs met, call bell in reach. Chair alarm on.   Maurene Capes Mobility Specialist Please contact via SecureChat or Rehab office at [phone]

## 2023-06-23 NOTE — Consult Note (Signed)
[facility] Liaison Note  06/23/2023  [name] 01-12-1956 [id number]  Location: RN Hospital Liaison screened the patient remotely at [facility].  Insurance: [employer]vantage   [name] is a 67 y.o. female who is a Primary Care Patient of [name], FNP-[facility]. The patient was screened for  readmission hospitalization with noted extreme risk score for unplanned readmission risk with 3 IP/1 ED in 6 months.  The patient was assessed for potential Care Management service needs for post hospital transition for care coordination. Review of patient's electronic medical record reveals patient was admitted for left side distal radial plate fracture with dorsal displacement fragment. Pt has been recommended for Acute inpt rehab pending consult. Pt is an [facility] pt and will be followed by his provider's care management team. Will collaborate with [facility] once acute inpt services has been confirmed for STR.  Plan: [facility] Liaison will continue to follow progress and disposition to asess for post hospital community care coordination/management needs.  Referral request for community care coordination: Will collaborate with [facility] once acute inpt services has been confirmed for STR. pending disposition.   [facility] Care Management/Population Health does not replace or interfere with any arrangements made by the Inpatient Transition of Care team.   For questions contact:   Elliot Cousin, RN, [facility] [place]   [facility], Population Health Office Hours MTWF  8:00 am-6:00 pm Direct Dial: [phone] mobile [phone] [Office toll free line] Office Hours are M-F 8:30 - 5 pm [email or web address]

## 2023-06-23 NOTE — Plan of Care (Signed)
  Problem: Clinical Measurements: Goal: Ability to maintain clinical measurements within normal limits will improve Outcome: Progressing   

## 2023-06-23 NOTE — Progress Notes (Signed)
Inpatient Rehab Admissions Coordinator:   Per MD request patient was screened for CIR candidacy by Megan Salon, MS, CCC-SLP . At this time, Pt. does not appear to demonstrate medical necessity to justify in hospital rehabilitation/CIR. will not pursue a rehab consult for this Pt.   Recommend other rehab venues to be pursued.  Please contact me with any questions.  Megan Salon, MS, CCC-SLP Rehab Admissions Coordinator  [phone] (celll) [phone] (office)

## 2023-07-06 ENCOUNTER — Ambulatory Visit
Admission: RE | Admit: 2023-07-06 | Discharge: 2023-07-06 | Disposition: A | Discharge status: Home / Self Care | Payer: Medicare HMO | Source: Ambulatory Visit | Attending: Family Medicine | Admitting: Family Medicine

## 2023-07-06 DIAGNOSIS — M8588 Other specified disorders of bone density and structure, other site: Secondary | ICD-10-CM | POA: Diagnosis not present

## 2023-07-06 DIAGNOSIS — E2839 Other primary ovarian failure: Secondary | ICD-10-CM

## 2023-07-06 DIAGNOSIS — N958 Other specified menopausal and perimenopausal disorders: Secondary | ICD-10-CM | POA: Diagnosis not present

## 2023-07-07 ENCOUNTER — Encounter (HOSPITAL_COMMUNITY): Payer: Self-pay | Admitting: Orthopedic Surgery

## 2023-07-07 ENCOUNTER — Telehealth: Payer: Self-pay | Admitting: Neurology

## 2023-07-07 NOTE — Telephone Encounter (Signed)
Pt left a message on the VM stating that she needs to speak to someone about being put on a Blood thinner and her taking the Dilantin

## 2023-07-07 NOTE — Telephone Encounter (Signed)
Pt called an informed that the Phenytoin can reduce the warfarin in her system, but they usually can adjust the dose at coumadin clinic, so this is not an issue. Pt advised to take medication as directed

## 2023-07-08 ENCOUNTER — Encounter (HOSPITAL_COMMUNITY): Payer: Self-pay | Admitting: Orthopedic Surgery

## 2023-07-09 DIAGNOSIS — S52532D Colles' fracture of left radius, subsequent encounter for closed fracture with routine healing: Secondary | ICD-10-CM | POA: Diagnosis not present

## 2023-07-10 DIAGNOSIS — Z7901 Long term (current) use of anticoagulants: Secondary | ICD-10-CM | POA: Diagnosis not present

## 2023-07-11 DIAGNOSIS — Z4789 Encounter for other orthopedic aftercare: Secondary | ICD-10-CM | POA: Diagnosis not present

## 2023-07-14 DIAGNOSIS — S52532D Colles' fracture of left radius, subsequent encounter for closed fracture with routine healing: Secondary | ICD-10-CM | POA: Diagnosis not present

## 2023-07-16 ENCOUNTER — Encounter: Payer: Medicare HMO | Admitting: Adult Health

## 2023-07-16 DIAGNOSIS — Z7901 Long term (current) use of anticoagulants: Secondary | ICD-10-CM | POA: Diagnosis not present

## 2023-07-21 DIAGNOSIS — D649 Anemia, unspecified: Secondary | ICD-10-CM | POA: Diagnosis not present

## 2023-07-23 ENCOUNTER — Ambulatory Visit: Payer: Medicare HMO | Attending: Internal Medicine | Admitting: Internal Medicine

## 2023-07-23 VITALS — BP 112/78 | HR 91 | Ht 67.0 in | Wt 128.0 lb

## 2023-07-23 DIAGNOSIS — Z7901 Long term (current) use of anticoagulants: Secondary | ICD-10-CM | POA: Diagnosis not present

## 2023-07-23 DIAGNOSIS — Q282 Arteriovenous malformation of cerebral vessels: Secondary | ICD-10-CM

## 2023-07-23 DIAGNOSIS — I824Y9 Acute embolism and thrombosis of unspecified deep veins of unspecified proximal lower extremity: Secondary | ICD-10-CM | POA: Diagnosis not present

## 2023-07-23 DIAGNOSIS — I480 Paroxysmal atrial fibrillation: Secondary | ICD-10-CM | POA: Diagnosis not present

## 2023-07-23 NOTE — Progress Notes (Signed)
OFFICE NOTE  Chief Complaint:  Follow-up  Primary Care Physician: [name], FNP  HPI:  [name] is a 67 y.o. 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  She was recently hospitalized in December for syncope after a fall.  I had only seen her once in 2018 in the hospital.  New onset atrial flutter with ventricular rapid rate.  She was on Cardizem for rate control.  Given IV amiodarone and fortunately had converted to sinus rhythm.  She was not considered an anticoagulation candidate.  Subsequently she was seen in follow-up by Bettina Gavia, PA.  She was noted to have left leg swelling and pain.  Lower extremity venous ultrasound was performed which showed extensive DVT of the left common femoral vein, saphenofemoral junction and left femoral vein.  Then seen by Dr. Pollyann Glen about her history of bleeding and the fact that she was not really likely a candidate for anticoagulation.  Subsequently she was then admitted to the hospital.  She was initially placed on IV heparin and consulted by Dr. Myra Gianotti with vascular surgery.  He had recommended an IVC filter.  This was subsequently placed by Dr. Durwin Nora.  She has had no further follow-up.  Report improvement in her pain and resolution of her swelling.  She has questions today as to how long she may need her IVC filter and what further follow-up is necessary.  07/23/2023  [name] is seen today in follow-up.  Over this past year she has had a number of health issues.  Over the summer she had an issue with paroxysmal atrial fibrillation and has a history of DVT with an IVC filter in 2022 and recent upper extremity DVT in July 2024.  Subsequently although she was not considered an anticoagulation candidate she ultimately was placed on warfarin.  This has been maintained  and monitored by her primary care provider.  She was bridged recently for surgery after she had a traumatic left radial diaphysis fracture.  Currently she is in sinus rhythm.  She reports that she has been free of seizures for more than 6 months which she is very pleased about  She has follow-up with her neurologist in May.  PMHx:  Past Medical History:  Diagnosis Date   Acute bronchitis 08/06/2022   Acute respiratory failure with hypoxia (HCC) 03/31/2015   Altered mental status    Asthma    Ataxia 08/26/2019   Atypical chest pain 02/17/2017   Cerebral hemorrhage (HCC) 1989   Chronic back pain    Closed fracture of distal end of left radius 07/17/2022   Closed fracture of fifth metatarsal bone 01/04/2019   Closed fracture of proximal phalanx of great toe 01/04/2019   Closed nondisplaced fracture of styloid process of left radius 08/06/2022   COPD (chronic obstructive pulmonary disease) (HCC)    no meds per daughter   COPD with exacerbation (HCC) 03/30/2015   Costochondritis 02/17/2017   COVID-19 virus infection 04/02/2020   DDD (degenerative disc disease) 06/12/2013   DVT (deep venous thrombosis) (HCC)    "she's had several since 1990"   GERD (gastroesophageal reflux disease)    no meds per daughter   Headache    "usually around time when she's had a seizure"   History of blood transfusion    "when she was a baby"   History of  kidney stones    History of stomach ulcers    Hypoxia 01/18/2015   Lactic acidosis 04/02/2020   Neuropathy    Seizures (HCC)    "because of her brain surgery"  last one 07/28/22   Sepsis secondary to UTI (HCC) 01/17/2015   Stroke [facility]) 1990's   family denies residual on 11/09/2014   Tunnel vision    "since brain OR"    Past Surgical History:  Procedure Laterality Date   BRAIN SURGERY  1989   craniotomy with hematoma evacuation   BREAST BIOPSY Left 12/22/2022   [place] LT BREAST BX W LOC DEV 1ST LESION IMG BX SPEC US GUIDE 12/22/2022 [facility]    BREAST BIOPSY  01/09/2023   MM LT RADIOACTIVE SEED LOC MAMMO GUIDE 01/09/2023 [facility]   BREAST LUMPECTOMY WITH RADIOACTIVE SEED AND SENTINEL LYMPH NODE BIOPSY Left 01/12/2023   Procedure: LEFT BREAST LUMPECTOMY WITH RADIOACTIVE SEED AND SENTINEL LYMPH NODE BIOPSY;  Surgeon: Manus Rudd, MD;  Location: [facility];  Service: General;  Laterality: Left;   CESAREAN SECTION  1979; 1987; 1989   HEMORRHOID SURGERY     IVC FILTER INSERTION N/A 10/12/2020   Procedure: IVC FILTER INSERTION;  Surgeon: Chuck Hint, MD;  Location: [facility] INVASIVE CV LAB;  Service: Cardiovascular;  Laterality: N/A;   OPEN REDUCTION INTERNAL FIXATION (ORIF) DISTAL RADIAL FRACTURE Left 08/25/2022   Procedure: OPEN REDUCTION INTERNAL FIXATION (ORIF) DISTAL RADIUS FRACTURE of nascent malunion;  Surgeon: Bradly Bienenstock, MD;  Location: [facility] OR;  Service: Orthopedics;  Laterality: Left;  regional with iv sedation   OPEN REDUCTION INTERNAL FIXATION (ORIF) DISTAL RADIAL FRACTURE Left 06/17/2023   Procedure: OPEN REDUCTION INTERNAL FIXATION (ORIF) DISTAL RADIUS FRACTURE;  Surgeon: Bradly Bienenstock, MD;  Location: [facility] OR;  Service: Orthopedics;  Laterality: Left;   TUBAL LIGATION  1990's    FAMHx:  Family History  Problem Relation Age of Onset   Colon cancer Mother        started as vaginal cancer   Heart attack Mother        4 MIs, started in her 27s, also vaginal cancer and colon cancer   Cancer Mother    Heart attack Father        Died suddenly age 10 - autopsy revealed heart attack her patient   Cancer Brother        Sinus cancer   Cervical cancer Maternal Aunt    Rectal cancer Neg Hx    Stomach cancer Neg Hx    Esophageal cancer Neg Hx     SOCHx:   reports that she quit smoking about 5 years ago. Her smoking use included cigarettes. She started smoking about 25 years ago. She has a 10 pack-year smoking history. She has never used smokeless tobacco. She reports that she does not drink alcohol  and does not use drugs.  ALLERGIES:  Allergies  Allergen Reactions   Zonegran [Zonisamide] Other (See Comments)    Numbness and tingling over whole body   Chocolate Other (See Comments)    Triggers seizures   Codeine Nausea And Vomiting   Keppra [Levetiracetam] Other (See Comments)    Causes seizures   Lyrica [Pregabalin] Nausea And Vomiting   Olive Oil Rash    ROS: Pertinent items noted in HPI and remainder of comprehensive ROS otherwise negative.  HOME MEDS: Current Outpatient Medications on File Prior to Visit  Medication Sig Dispense Refill   acetaminophen (TYLENOL) 325 MG tablet Take 2 tablets (650 mg total)  by mouth every 4 (four) hours as needed for mild pain (pain score 1-3).     anastrozole (ARIMIDEX) 1 MG tablet Take 1 tablet (1 mg total) by mouth daily. 90 tablet 3   cyanocobalamin (VITAMIN B12) 1000 MCG tablet Take 2 tablets (2,000 mcg total) by mouth daily. 60 tablet 0   diclofenac Sodium (VOLTAREN) 1 % GEL Apply 2 g topically 4 (four) times daily as needed. To top of right foot 100 g 0   DILANTIN 100 MG ER capsule Take 1 capsule (100 mg total) by mouth 2 (two) times daily. 180 capsule 3   DILANTIN 30 MG ER capsule TAKE 1 CAPSULE BY MOUTH EVERY  NIGHT ALONG WITH 100 MG CAPSULE  FOR A TOTAL EVERY NIGHT DOSE OF  130 MG 100 capsule 3   famotidine (PEPCID) 20 MG tablet 1 tablet Orally twice a day for 90 days     folic acid (FOLVITE) 1 MG tablet Take 1 tablet (1 mg total) by mouth daily. 30 tablet 0   gabapentin (NEURONTIN) 300 MG capsule Take 1 capsule (300 mg total) by mouth 2 (two) times daily. 60 capsule 0   LORazepam (ATIVAN) 1 MG tablet TAKE 1 TABLET BY MOUTH AS DIRECTED AS NEEDED FOR 3 OR MORE SEIZURES IN 24 HOURS. DO NOT TAKE MORE THAN 2 TABLETS IN 24 HOURS. Strength: 1 mg 10 tablet 5   oxyCODONE 10 MG TABS Take 1 tablet (10 mg total) by mouth every 4 (four) hours as needed for moderate pain (pain score 4-6), severe pain (pain score 7-10) or breakthrough pain. 30  tablet 0   polyethylene glycol (MIRALAX / GLYCOLAX) 17 g packet Take 17 g by mouth 2 (two) times daily. 14 each 0   potassium chloride SA (KLOR-CON M) 20 MEQ tablet Take 1 tablet (20 mEq total) by mouth every other day. 15 tablet 0   senna-docusate (SENOKOT-S) 8.6-50 MG tablet Take 2 tablets by mouth 2 (two) times daily. 30 tablet 0   VIMPAT 200 MG TABS tablet Take 1 tablet (200 mg total) by mouth 2 (two) times daily. 180 tablet 3   warfarin (COUMADIN) 6 MG tablet Take 1 tablet (6 mg total) by mouth daily at 4 PM. 30 tablet 1   No current facility-administered medications on file prior to visit.    LABS/IMAGING: No results found for this or any previous visit (from the past 48 hours). No results found.  LIPID PANEL:    Component Value Date/Time   CHOL 219 (H) 10/10/2020 0934   TRIG 106 10/10/2020 0934   HDL 89 10/10/2020 0934   CHOLHDL 2.5 10/10/2020 0934   CHOLHDL 2.2 05/24/2018 0556   VLDL 6 05/24/2018 0556   LDLCALC 112 (H) 10/10/2020 0934     WEIGHTS: Wt Readings from Last 3 Encounters:  07/23/23 128 lb (58.1 kg)  06/18/23 146 lb 9.7 oz (66.5 kg)  04/16/23 129 lb (58.5 kg)    VITALS: BP 112/78 (BP Location: Right Arm, Patient Position: Sitting, Cuff Size: Normal)   Pulse 91   Ht 5\' 7"  (1.702 m)   Wt 128 lb (58.1 kg)   SpO2 97%   BMI 20.05 kg/m   EXAM: General appearance: alert and no distress Neck: no carotid bruit, no JVD and thyroid not enlarged, symmetric, no tenderness/mass/nodules Lungs: clear to auscultation bilaterally Heart: regular rate and rhythm, S1, S2 normal, no murmur, click, rub or gallop Abdomen: soft, non-tender; bowel sounds normal; no masses,  no organomegaly Extremities: extremities normal, atraumatic, no  cyanosis or edema Pulses: 2+ and symmetric Skin: Skin color, texture, turgor normal. No rashes or lesions Neurologic: Grossly normal Psych: Pleasant  EKG: EKG Interpretation Date/Time:  Thursday July 23 2023 08:42:27  EST Ventricular Rate:  89 PR Interval:  148 QRS Duration:  84 QT Interval:  368 QTC Calculation: 447 R Axis:   39  Text Interpretation: Normal sinus rhythm Normal ECG When compared with ECG of 15-Jun-2023 05:59, No significant change since last tracing Confirmed by Zoila Shutter [phone]) on 07/23/2023 8:52:59 AM    ASSESSMENT: Paroxysmal atrial flutter and fibrillation History of upper and lower extremity DVT-now on warfarin Status post IVC filter (10/12/2020) History of intracranial AV malformation with rupture in 1988, seizures, chronic headaches COPD Chronic back pain/polyneuropathy Family history of early coronary artery disease in both parents  PLAN: 1.   [name] has had paroxysmal atrial flutter and fibrillation and recurrent DVT despite IVC filter, she is now anticoagulated.  Previously she was not considered a candidate for anticoagulation however apparently this is recently changed.  She seems to be doing well on warfarin but does have a history of AV malformation in the brain with bleeding back in the 1980s.  She does have a seizure disorder but reports being seizure-free in the past 6 months.  She has follow-up with her neurologist in the spring.  She is in sinus rhythm today.  No complaints of chest pain or shortness of breath.  Will continue her current therapies.  Blood pressure is well-controlled.  Follow-up with me annually or sooner as necessary.  Chrystie Nose, MD, [facility], FACP  [place]  [facility]  Medical Director of the [facility] Lipid Disorders &  Cardiovascular Risk Reduction Clinic Diplomate of the American Board of Clinical Lipidology Attending Cardiologist  Direct Dial: [phone]  Fax: [phone]  Website:  [email or web address] Nicely [name] 07/23/2023, 8:53 AM

## 2023-07-23 NOTE — Patient Instructions (Signed)
Medication Instructions:  No changes at this  time  *If you need a refill on your cardiac medications before your next appointment, please call your pharmacy*   Lab Work: None needed  If you have labs (blood work) drawn today and your tests are completely normal, you will receive your results only by: MyChart Message (if you have MyChart) OR A paper copy in the mail If you have any lab test that is abnormal or we need to change your treatment, we will call you to review the results.   Testing/Procedures: None ordered    Follow-Up: At [facility], you and your health needs are our priority.  As part of our continuing mission to provide you with exceptional heart care, we have created designated Provider Care Teams.  These Care Teams include your primary Cardiologist (physician) and Advanced Practice Providers (APPs -  Physician Assistants and Nurse Practitioners) who all work together to provide you with the care you need, when you need it.    Your next appointment:   1 year(s)  Provider:   Chrystie Nose, MD

## 2023-07-27 ENCOUNTER — Other Ambulatory Visit: Payer: Self-pay | Admitting: Family Medicine

## 2023-07-27 DIAGNOSIS — K838 Other specified diseases of biliary tract: Secondary | ICD-10-CM

## 2023-08-12 ENCOUNTER — Encounter: Payer: Self-pay | Admitting: Family Medicine

## 2023-08-17 ENCOUNTER — Ambulatory Visit
Admission: RE | Admit: 2023-08-17 | Discharge: 2023-08-17 | Disposition: A | Discharge status: Home / Self Care | Payer: Medicare HMO | Source: Ambulatory Visit | Attending: Family Medicine | Admitting: Family Medicine

## 2023-08-17 ENCOUNTER — Telehealth: Payer: Self-pay

## 2023-08-17 DIAGNOSIS — K838 Other specified diseases of biliary tract: Secondary | ICD-10-CM

## 2023-08-17 DIAGNOSIS — Z79899 Other long term (current) drug therapy: Secondary | ICD-10-CM

## 2023-08-17 DIAGNOSIS — Z7901 Long term (current) use of anticoagulants: Secondary | ICD-10-CM | POA: Diagnosis not present

## 2023-08-17 NOTE — Telephone Encounter (Signed)
 Pt's physical therapist called stated that pt is taken her Dilantin 30mg  BID instead of once a day, she is going to soon run out of medication ,

## 2023-08-18 NOTE — Telephone Encounter (Signed)
 Pls have [name] bring her to lab first thing in AM for free and total Dilantin level. I will send in Rx for Dilantin 30mg  BID to take with 100mg  BID, for total of 130mg  BID (pls confirm this is how she is taking it). thanks

## 2023-08-18 NOTE — Telephone Encounter (Signed)
 [name] said she has been doing it, she said Robinn has been a month seizure free, she has been doing this she thinks since her surgery in December. [name] said she just found out as well when she took her to the Dr the other day and when she told the therapist,

## 2023-08-18 NOTE — Telephone Encounter (Signed)
 Can you pls call her daughter [name] and see what is going on? I thought [name] manages meds? Thanks

## 2023-08-19 ENCOUNTER — Other Ambulatory Visit: Payer: Medicare HMO

## 2023-08-19 NOTE — Telephone Encounter (Signed)
 Spoke with [name] to have her bring [name] to lab first thing in AM for free and total Dilantin  level.Dr Ty Gales will send in Rx for Dilantin  30mg  BID to take with 100mg  BID, for total of 130mg  BID

## 2023-08-19 NOTE — Telephone Encounter (Signed)
Pt daughter called no answer left a voice mail to call the office back  

## 2023-08-19 NOTE — Telephone Encounter (Signed)
 Pt's daughter called in and left a message. She is returning a call

## 2023-08-20 ENCOUNTER — Telehealth: Payer: Self-pay | Admitting: Neurology

## 2023-08-20 ENCOUNTER — Telehealth: Payer: Self-pay

## 2023-08-20 NOTE — Telephone Encounter (Signed)
Home Health ([facility]) called Patient Fractured wrist from fall would like to continue Home Health Care and was trying to get Dr. Karel Jarvis to sign off on Continuation of Home Health 1 x a week for 4 weeks.

## 2023-08-20 NOTE — Telephone Encounter (Signed)
Pt Vimpat 200 mg  needs a PA

## 2023-08-21 ENCOUNTER — Telehealth: Payer: Self-pay

## 2023-08-21 NOTE — Telephone Encounter (Signed)
I haven't evaluated her for this, it should be from her orthopedic doctor,thanks

## 2023-08-21 NOTE — Telephone Encounter (Signed)
[name]  with home health  called [name] had a seizure due to missed medication she can't remember the night but she thinks she forgot to take it one night. Her knee is hurting her really had and bothering her. [name] was not giving her medication she stated when she had the seizure but now she is monitoring [name]'s medication

## 2023-08-21 NOTE — Telephone Encounter (Signed)
[name]  with home health called advised to call orthopedic doctor for orders for home health

## 2023-08-24 ENCOUNTER — Other Ambulatory Visit (HOSPITAL_COMMUNITY): Payer: Self-pay

## 2023-08-24 NOTE — Telephone Encounter (Signed)
PA not needed. Last filled on 1.14.25.

## 2023-08-25 ENCOUNTER — Other Ambulatory Visit: Payer: Self-pay

## 2023-08-25 ENCOUNTER — Other Ambulatory Visit: Payer: Medicare HMO

## 2023-08-25 DIAGNOSIS — Z7901 Long term (current) use of anticoagulants: Secondary | ICD-10-CM | POA: Diagnosis not present

## 2023-08-25 DIAGNOSIS — Z79899 Other long term (current) drug therapy: Secondary | ICD-10-CM

## 2023-08-25 NOTE — Telephone Encounter (Signed)
We'll see what her Dilantin level looks like, thanks

## 2023-08-25 NOTE — Progress Notes (Signed)
phen

## 2023-08-25 NOTE — Telephone Encounter (Signed)
Pt is here today 08/25/23 to get labs done

## 2023-09-01 ENCOUNTER — Other Ambulatory Visit: Payer: Self-pay | Admitting: Family Medicine

## 2023-09-01 ENCOUNTER — Ambulatory Visit
Admission: RE | Admit: 2023-09-01 | Discharge: 2023-09-01 | Disposition: A | Discharge status: Home / Self Care | Payer: Medicare HMO | Source: Ambulatory Visit | Attending: Family Medicine | Admitting: Family Medicine

## 2023-09-01 DIAGNOSIS — K838 Other specified diseases of biliary tract: Secondary | ICD-10-CM

## 2023-09-03 ENCOUNTER — Telehealth: Payer: Self-pay | Admitting: Neurology

## 2023-09-03 DIAGNOSIS — Z79899 Other long term (current) drug therapy: Secondary | ICD-10-CM

## 2023-09-03 NOTE — Telephone Encounter (Signed)
Left message with the after hour service on 1-30+-25 at 12:27 pm  Caller states that he wanted to let us know that the patient had seizure and fell but did not have a injury    If there are no instructions for him then he does not need a call back

## 2023-09-03 NOTE — Telephone Encounter (Signed)
They was not able to get the labs when she came to get lab work wasn't able to start IV at MRI  Pt has been nervous about MRI and her daughter thinks that is what caused the seizure she is worked up that she may have Cancer in the bile duct. They did so the mri with out the contrast so they hope she gets good results,  Pt c/o: seizure Missed medications?  No. Sleep deprived?  No. Alcohol intake?  No. Increased stress? Yes.   Any change in medication color or shape? No. Back to their usual baseline self?  Yes.  . If no, advise go to ER Current medications prescribed by Dr. Karel Jarvis: Dilantin 100mg  with 30 mg BID  VIMPAT 200 MG TABS tablet Take 1 tablet (200 mg total) by mouth 2 (two) times daily.  LORazepam (ATIVAN) 1 MG tablet  TAKE 1 TABLET BY MOUTH AS DIRECTED AS NEEDED FOR 3 OR MORE SEIZURES IN 24 HOURS. DO NOT TAKE MORE THAN 2 TABLETS IN 60 HOURS   New order placed for lab work

## 2023-09-09 DIAGNOSIS — S52352D Displaced comminuted fracture of shaft of radius, left arm, subsequent encounter for closed fracture with routine healing: Secondary | ICD-10-CM | POA: Diagnosis not present

## 2023-09-09 DIAGNOSIS — J449 Chronic obstructive pulmonary disease, unspecified: Secondary | ICD-10-CM | POA: Diagnosis not present

## 2023-09-09 DIAGNOSIS — G40909 Epilepsy, unspecified, not intractable, without status epilepticus: Secondary | ICD-10-CM | POA: Diagnosis not present

## 2023-09-09 DIAGNOSIS — I119 Hypertensive heart disease without heart failure: Secondary | ICD-10-CM | POA: Diagnosis not present

## 2023-09-09 DIAGNOSIS — E785 Hyperlipidemia, unspecified: Secondary | ICD-10-CM | POA: Diagnosis not present

## 2023-09-09 DIAGNOSIS — S52512D Displaced fracture of left radial styloid process, subsequent encounter for closed fracture with routine healing: Secondary | ICD-10-CM | POA: Diagnosis not present

## 2023-09-09 DIAGNOSIS — I480 Paroxysmal atrial fibrillation: Secondary | ICD-10-CM | POA: Diagnosis not present

## 2023-09-09 DIAGNOSIS — S62303D Unspecified fracture of third metacarpal bone, left hand, subsequent encounter for fracture with routine healing: Secondary | ICD-10-CM | POA: Diagnosis not present

## 2023-09-09 DIAGNOSIS — Z7901 Long term (current) use of anticoagulants: Secondary | ICD-10-CM | POA: Diagnosis not present

## 2023-09-09 DIAGNOSIS — W19XXXD Unspecified fall, subsequent encounter: Secondary | ICD-10-CM | POA: Diagnosis not present

## 2023-09-15 DIAGNOSIS — J449 Chronic obstructive pulmonary disease, unspecified: Secondary | ICD-10-CM | POA: Diagnosis not present

## 2023-09-15 DIAGNOSIS — Z7901 Long term (current) use of anticoagulants: Secondary | ICD-10-CM | POA: Diagnosis not present

## 2023-09-15 DIAGNOSIS — W19XXXD Unspecified fall, subsequent encounter: Secondary | ICD-10-CM | POA: Diagnosis not present

## 2023-09-15 DIAGNOSIS — I119 Hypertensive heart disease without heart failure: Secondary | ICD-10-CM | POA: Diagnosis not present

## 2023-09-15 DIAGNOSIS — G40909 Epilepsy, unspecified, not intractable, without status epilepticus: Secondary | ICD-10-CM | POA: Diagnosis not present

## 2023-09-15 DIAGNOSIS — S62303D Unspecified fracture of third metacarpal bone, left hand, subsequent encounter for fracture with routine healing: Secondary | ICD-10-CM | POA: Diagnosis not present

## 2023-09-15 DIAGNOSIS — S52512D Displaced fracture of left radial styloid process, subsequent encounter for closed fracture with routine healing: Secondary | ICD-10-CM | POA: Diagnosis not present

## 2023-09-15 DIAGNOSIS — I480 Paroxysmal atrial fibrillation: Secondary | ICD-10-CM | POA: Diagnosis not present

## 2023-09-15 DIAGNOSIS — E785 Hyperlipidemia, unspecified: Secondary | ICD-10-CM | POA: Diagnosis not present

## 2023-09-15 DIAGNOSIS — S52352D Displaced comminuted fracture of shaft of radius, left arm, subsequent encounter for closed fracture with routine healing: Secondary | ICD-10-CM | POA: Diagnosis not present

## 2023-09-22 ENCOUNTER — Encounter: Payer: Self-pay | Admitting: Adult Health

## 2023-09-22 ENCOUNTER — Inpatient Hospital Stay: Payer: Medicare HMO | Attending: Adult Health | Admitting: Adult Health

## 2023-09-22 VITALS — BP 119/72 | HR 77 | Temp 98.1°F | Resp 17 | Ht 67.0 in | Wt 130.5 lb

## 2023-09-22 DIAGNOSIS — Z923 Personal history of irradiation: Secondary | ICD-10-CM | POA: Diagnosis not present

## 2023-09-22 DIAGNOSIS — C50412 Malignant neoplasm of upper-outer quadrant of left female breast: Secondary | ICD-10-CM | POA: Insufficient documentation

## 2023-09-22 DIAGNOSIS — Z8616 Personal history of COVID-19: Secondary | ICD-10-CM | POA: Insufficient documentation

## 2023-09-22 DIAGNOSIS — Z170 Estrogen receptor positive status [ER+]: Secondary | ICD-10-CM | POA: Diagnosis not present

## 2023-09-22 DIAGNOSIS — Z79811 Long term (current) use of aromatase inhibitors: Secondary | ICD-10-CM | POA: Insufficient documentation

## 2023-09-22 DIAGNOSIS — Z87891 Personal history of nicotine dependence: Secondary | ICD-10-CM | POA: Insufficient documentation

## 2023-09-22 DIAGNOSIS — Z8049 Family history of malignant neoplasm of other genital organs: Secondary | ICD-10-CM | POA: Insufficient documentation

## 2023-09-22 DIAGNOSIS — Z800 Family history of malignant neoplasm of digestive organs: Secondary | ICD-10-CM | POA: Diagnosis not present

## 2023-09-22 DIAGNOSIS — Z808 Family history of malignant neoplasm of other organs or systems: Secondary | ICD-10-CM | POA: Diagnosis not present

## 2023-09-22 NOTE — Progress Notes (Signed)
SURVIVORSHIP VISIT:  BRIEF ONCOLOGIC HISTORY:  Oncology History  Malignant neoplasm of upper-outer quadrant of left breast in female, estrogen receptor positive (HCC)  12/15/2022 Mammogram   IMPRESSION: 1. Suspicious 1.1 cm OUTER LEFT breast mass. Tissue sampling is recommended. No abnormal appearing LEFT axillary lymph nodes. 2. No other significant mammographic findings within either breast.   12/22/2022 Pathology Results   Pathology showed grade 2 IDC, ER 95% moderate-strong staining, PR neg. Her 2 neg.   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  The report date is January 08, 2023.  The Multi-Cancer + RNA Panel offered by [facility] includes sequencing and/or deletion/duplication analysis of the following 70 genes:  AIP*, ALK, APC*, ATM*, AXIN2*, BAP1*, BARD1*, BLM*, BMPR1A*, BRCA1*, BRCA2*, BRIP1*, CDC73*, CDH1*, CDK4, CDKN1B*, CDKN2A, CHEK2*, CTNNA1*, DICER1*, EPCAM (del/dup only), EGFR, FH*, FLCN*, GREM1 (promoter dup only), HOXB13, KIT, LZTR1, MAX*, MBD4, MEN1*, MET, MITF, MLH1*, MSH2*, MSH3*, MSH6*, MUTYH*, NF1*, NF2*, NTHL1*, PALB2*, PDGFRA, PMS2*, POLD1*, POLE*, POT1*, PRKAR1A*, PTCH1*, PTEN*, RAD51C*, RAD51D*, RB1*, RET, SDHA* (sequencing only), SDHAF2*, SDHB*, SDHC*, SDHD*, SMAD4*, SMARCA4*, SMARCB1*, SMARCE1*, STK11*, SUFU*, TMEM127*, TP53*, TSC1*, TSC2*, VHL*. RNA analysis is performed for * genes.    01/12/2023 Surgery    Left breast lumpectomy: IDC, 1.6cm, grade 2, margins negative, 1 SLN negative for cancer   01/12/2023 Oncotype testing   19/6%   01/12/2023 Cancer Staging   Staging form: Breast, AJCC 8th Edition - Pathologic stage from 01/12/2023: Stage IA (pT1c, pN0, cM0, G2, ER+, PR-, HER2-) - Signed by Loa Socks, NP on 09/22/2023 Stage prefix: Initial diagnosis Histologic grading system: 3 grade system   03/18/2023 - 04/17/2023 Radiation Therapy   Plan Name: Breast_L Site: Breast, Left Technique: 3D Mode: Photon Dose Per Fraction: 2.67 Gy Prescribed Dose (Delivered / Prescribed): 40.05 Gy / 40.05 Gy Prescribed Fxs (Delivered / Prescribed): 15 / 15   Plan Name: Breast_Lt_Bst Site: Breast, Left Technique: 3D Mode: Photon Dose Per Fraction: 2 Gy Prescribed Dose (Delivered / Prescribed): 12 Gy / 12 Gy Prescribed Fxs (Delivered / Prescribed): 6 / 6       06/2023 -  Anti-estrogen oral therapy   Anastrozole      INTERVAL HISTORY:  [name] to review her survivorship care plan detailing her treatment course for breast cancer, as well as monitoring long-term side effects of that treatment, education regarding health maintenance, screening, and overall wellness and health promotion.     Overall, [name] reports feeling quite well.  She takes anastrozole daily and says she is tolerating it well.    REVIEW OF SYSTEMS:  Review of Systems  Constitutional:  Negative for appetite change, chills, fatigue, fever and unexpected weight change.  HENT:   Negative for hearing loss, lump/mass and trouble swallowing.   Eyes:  Negative for eye problems and icterus.  Respiratory:  Negative for chest tightness, cough and shortness of breath.   Cardiovascular:  Negative for chest pain, leg swelling and palpitations.  Gastrointestinal:  Negative for abdominal distention,  abdominal pain, constipation, diarrhea, nausea and vomiting.  Endocrine: Negative for hot flashes.  Genitourinary:  Negative  for difficulty urinating.   Musculoskeletal:  Negative for arthralgias.  Skin:  Negative for itching and rash.  Neurological:  Negative for dizziness, extremity weakness, headaches and numbness.  Hematological:  Negative for adenopathy. Does not bruise/bleed easily.  Psychiatric/Behavioral:  Negative for depression. The patient is not nervous/anxious.    Breast: Denies any new nodularity, masses, tenderness, nipple changes, or nipple discharge.       PAST MEDICAL/SURGICAL HISTORY:  Past Medical History:  Diagnosis Date   Acute bronchitis 08/06/2022   Acute respiratory failure with hypoxia (HCC) 03/31/2015   Altered mental status    Asthma    Ataxia 08/26/2019   Atypical chest pain 02/17/2017   Cerebral hemorrhage (HCC) 1989   Chronic back pain    Closed fracture of distal end of left radius 07/17/2022   Closed fracture of fifth metatarsal bone 01/04/2019   Closed fracture of proximal phalanx of great toe 01/04/2019   Closed nondisplaced fracture of styloid process of left radius 08/06/2022   COPD (chronic obstructive pulmonary disease) (HCC)    no meds per daughter   COPD with exacerbation (HCC) 03/30/2015   Costochondritis 02/17/2017   COVID-19 virus infection 04/02/2020   DDD (degenerative disc disease) 06/12/2013   DVT (deep venous thrombosis) (HCC)    "she's had several since 1990"   GERD (gastroesophageal reflux disease)    no meds per daughter   Headache    "usually around time when she's had a seizure"   History of blood transfusion    "when she was a baby"   History of kidney stones    History of stomach ulcers    Hypoxia 01/18/2015   Lactic acidosis 04/02/2020   Neuropathy    Seizures (HCC)    "because of her brain surgery"  last one 07/28/22   Sepsis secondary to UTI (HCC) 01/17/2015   Stroke [facility]) 1990's   family denies residual on 11/09/2014   Tunnel vision    "since brain OR"   Past Surgical History:  Procedure Laterality Date   BRAIN SURGERY  1989    craniotomy with hematoma evacuation   BREAST BIOPSY Left 12/22/2022   [place] LT BREAST BX W LOC DEV 1ST LESION IMG BX SPEC US GUIDE 12/22/2022 [facility]   BREAST BIOPSY  01/09/2023   MM LT RADIOACTIVE SEED LOC MAMMO GUIDE 01/09/2023 [facility]   BREAST LUMPECTOMY WITH RADIOACTIVE SEED AND SENTINEL LYMPH NODE BIOPSY Left 01/12/2023   Procedure: LEFT BREAST LUMPECTOMY WITH RADIOACTIVE SEED AND SENTINEL LYMPH NODE BIOPSY;  Surgeon: Manus Rudd, MD;  Location: [facility];  Service: General;  Laterality: Left;   CESAREAN SECTION  1979; 1987; 1989   HEMORRHOID SURGERY     IVC FILTER INSERTION N/A 10/12/2020   Procedure: IVC FILTER INSERTION;  Surgeon: Chuck Hint, MD;  Location: [facility];  Service: Cardiovascular;  Laterality: N/A;   OPEN REDUCTION INTERNAL FIXATION (ORIF) DISTAL RADIAL FRACTURE Left 08/25/2022   Procedure: OPEN REDUCTION INTERNAL FIXATION (ORIF) DISTAL RADIUS FRACTURE of nascent malunion;  Surgeon: Bradly Bienenstock, MD;  Location: [facility] OR;  Service: Orthopedics;  Laterality: Left;  regional with iv sedation   OPEN REDUCTION INTERNAL FIXATION (ORIF) DISTAL RADIAL FRACTURE Left 06/17/2023   Procedure: OPEN REDUCTION INTERNAL FIXATION (ORIF) DISTAL RADIUS FRACTURE;  Surgeon: Bradly Bienenstock, MD;  Location: [facility] OR;  Service: Orthopedics;  Laterality: Left;   TUBAL LIGATION  1990's     ALLERGIES:  Allergies  Allergen Reactions   Zonegran [Zonisamide] Other (See Comments)    Numbness and tingling over whole body   Chocolate Other (See Comments)    Triggers seizures   Codeine Nausea And Vomiting   Keppra [Levetiracetam] Other (See Comments)    Causes seizures   Lyrica [Pregabalin] Nausea And Vomiting   Olive Oil Rash     CURRENT MEDICATIONS:  Outpatient Encounter Medications as of 09/22/2023  Medication Sig Note   acetaminophen (TYLENOL) 325 MG tablet Take 2 tablets (650 mg total) by mouth every 4 (four) hours as needed for mild pain  (pain score 1-3).    alendronate (FOSAMAX) 70 MG tablet Take 70 mg by mouth once a week.    anastrozole (ARIMIDEX) 1 MG tablet Take 1 tablet (1 mg total) by mouth daily.    cyanocobalamin (VITAMIN B12) 1000 MCG tablet Take 2 tablets (2,000 mcg total) by mouth daily.    diclofenac Sodium (VOLTAREN) 1 % GEL Apply 2 g topically 4 (four) times daily as needed. To top of right foot    DILANTIN 100 MG ER capsule Take 1 capsule (100 mg total) by mouth 2 (two) times daily. 06/15/2023: Taken 0900 and 2100   DILANTIN 30 MG ER capsule TAKE 1 CAPSULE BY MOUTH EVERY  NIGHT ALONG WITH 100 MG CAPSULE  FOR A TOTAL EVERY NIGHT DOSE OF  130 MG    famotidine (PEPCID) 20 MG tablet 1 tablet Orally twice a day for 90 days    folic acid (FOLVITE) 1 MG tablet Take 1 tablet (1 mg total) by mouth daily.    LORazepam (ATIVAN) 1 MG tablet TAKE 1 TABLET BY MOUTH AS DIRECTED AS NEEDED FOR 3 OR MORE SEIZURES IN 24 HOURS. DO NOT TAKE MORE THAN 2 TABLETS IN 24 HOURS. Strength: 1 mg    polyethylene glycol (MIRALAX / GLYCOLAX) 17 g packet Take 17 g by mouth 2 (two) times daily.    potassium chloride SA (KLOR-CON M) 20 MEQ tablet Take 1 tablet (20 mEq total) by mouth every other day.    senna-docusate (SENOKOT-S) 8.6-50 MG tablet Take 2 tablets by mouth 2 (two) times daily.    VIMPAT 200 MG TABS tablet Take 1 tablet (200 mg total) by mouth 2 (two) times daily.    warfarin (COUMADIN) 6 MG tablet Take 1 tablet (6 mg total) by mouth daily at 4 PM. 07/23/2023: Pt is taking 7.5mg .   [DISCONTINUED] gabapentin (NEURONTIN) 300 MG capsule Take 1 capsule (300 mg total) by mouth 2 (two) times daily. (Patient not taking: Reported on 09/22/2023)    [DISCONTINUED] oxyCODONE 10 MG TABS Take 1 tablet (10 mg total) by mouth every 4 (four) hours as needed for moderate pain (pain score 4-6), severe pain (pain score 7-10) or breakthrough pain.    No facility-administered encounter medications on file as of 09/22/2023.     ONCOLOGIC FAMILY HISTORY:   Family History  Problem Relation Age of Onset   Colon cancer Mother        started as vaginal cancer   Heart attack Mother        4 MIs, started in her 80s, also vaginal cancer and colon cancer   Cancer Mother    Heart attack Father        Died suddenly age 63 - autopsy revealed heart attack her patient   Cancer Brother        Sinus cancer   Cervical cancer Maternal Aunt  Rectal cancer Neg Hx    Stomach cancer Neg Hx    Esophageal cancer Neg Hx      SOCIAL HISTORY:  Social History   Socioeconomic History   Marital status: Divorced    Spouse name: Not on file   Number of children: 3   Years of education: Not on file   Highest education level: Not on file  Occupational History   Not on file  Tobacco Use   Smoking status: Former    Current packs/day: 0.00    Average packs/day: 0.5 packs/day for 20.0 years (10.0 ttl pk-yrs)    Types: Cigarettes    Start date: 10/26/1997    Quit date: 10/26/2017    Years since quitting: 5.9   Smokeless tobacco: Never   Tobacco comments:    Previously smoked 2 ppd, down to 4 cigs/day, patient quit smoking in 2019  Vaping Use   Vaping status: Never Used  Substance and Sexual Activity   Alcohol use: No   Drug use: No   Sexual activity: Never    Birth control/protection: Post-menopausal  Other Topics Concern   Not on file  Social History Narrative   Pt lives in 2 story home with her daughter, daughter's family and pt's mother-in-law   Has 2 living children / 1 deceased   2023-09-26 grade education   Worked for [employer] until 1998 when she became disabled.    Right handed   Social Drivers of Health   Financial Resource Strain: Low Risk  (12/31/2022)   Overall Financial Resource Strain (CARDIA)    Difficulty of Paying Living Expenses: Not very hard  Food Insecurity: No Food Insecurity (06/15/2023)   Hunger Vital Sign    Worried About Running Out of Food in the Last Year: Never true    Ran Out of Food in the Last Year: Never true   Transportation Needs: No Transportation Needs (06/15/2023)   PRAPARE - Administrator, Civil Service (Medical): No    Lack of Transportation (Non-Medical): No  Physical Activity: Not on file  Stress: Not on file  Social Connections: Not on file  Intimate Partner Violence: Not At Risk (06/15/2023)   Humiliation, Afraid, Rape, and Kick questionnaire    Fear of Current or Ex-Partner: No    Emotionally Abused: No    Physically Abused: No    Sexually Abused: No     OBSERVATIONS/OBJECTIVE:  BP 119/72 (BP Location: Left Arm, Patient Position: Sitting)   Pulse 77   Temp 98.1 F (36.7 C) (Temporal)   Resp 17   Ht 5\' 7"  (1.702 m)   Wt 130 lb 8 oz (59.2 kg)   SpO2 100%   BMI 20.44 kg/m  GENERAL: Patient is a well appearing female in no acute distress HEENT:  Sclerae anicteric.  Oropharynx clear and moist. No ulcerations or evidence of oropharyngeal candidiasis. Neck is supple.  NODES:  No cervical, supraclavicular, or axillary lymphadenopathy palpated.  BREAST EXAM:  right breast benign, left breast s/p lumpectomy and radiation, no sign of local recurrence LUNGS:  Clear to auscultation bilaterally.  No wheezes or rhonchi. HEART:  Regular rate and rhythm. No murmur appreciated. ABDOMEN:  Soft, nontender.  Positive, normoactive bowel sounds. No organomegaly palpated. MSK:  No focal spinal tenderness to palpation. Full range of motion bilaterally in the upper extremities. EXTREMITIES:  No peripheral edema.   SKIN:  Clear with no obvious rashes or skin changes. No nail dyscrasia. NEURO:  Nonfocal. Well oriented.  Appropriate affect.  LABORATORY DATA:  None for this visit.  DIAGNOSTIC IMAGING:  None for this visit.      ASSESSMENT AND PLAN:  Ms.. Tortorelli is a pleasant 68 y.o. female with Stage IA left breast invasive ductal carcinoma, ER+/PR-/HER2-, diagnosed in 12/2022, treated with lumpectomy, adjuvant radiation therapy, and anti-estrogen therapy with Anastrozole  beginning in 06/2023.  She presents to the Survivorship Clinic for our initial meeting and routine follow-up post-completion of treatment for breast cancer.    1. Stage IA left breast cancer:  [name] is continuing to recover from definitive treatment for breast cancer. She will follow-up with her medical oncologist, Dr.  Al Pimple in 6 months with history and physical exam per surveillance protocol.  She will continue her anti-estrogen therapy with Anastrozole. Thus far, she is tolerating the Anastrozole well, with minimal side effects. Her mammogram is due 12/2023; orders placed today.   Today, a comprehensive survivorship care plan and treatment summary was reviewed with the patient today detailing her breast cancer diagnosis, treatment course, potential late/long-term effects of treatment, appropriate follow-up care with recommendations for the future, and patient education resources.  A copy of this summary, along with a letter will be sent to the patient's primary care provider via mail/fax/In Basket message after today's visit.    2. Bone health:  Given [name]'s age/history of breast cancer and her current treatment regimen including anti-estrogen therapy with Anastrozole, she is at risk for bone demineralization.  Her last DEXA scan was 07/06/2023 demonstrating osteoporosis with a t score of -3.5 in the total femur.  She was placed on fosamax by her pcp and tolerates this well.  She is recommended to undergo repeat DEXA testing in 07/2025.  She was given education on specific activities to promote bone health.  3. Cancer screening:  Due to [name]'s history and her age, she should receive screening for skin cancers, colon cancer, and gynecologic cancers.  The information and recommendations are listed on the patient's comprehensive care plan/treatment summary and were reviewed in detail with the patient.  She is also eligible for lung cancer screening, however undergoes CT chest due to falls  related to her seizures 1-2 times per year.    4. Health maintenance and wellness promotion: Ms. [name] was encouraged to consume 5-7 servings of fruits and vegetables per day. We reviewed the "Nutrition Rainbow" handout.  She was also encouraged to engage in moderate to vigorous exercise for 30 minutes per day most days of the week.  She was instructed to limit her alcohol consumption and continue to abstain from tobacco use.     5. Support services/counseling: It is not uncommon for this period of the patient's cancer care trajectory to be one of many emotions and stressors.   She was given information regarding our available services and encouraged to contact me with any questions or for help enrolling in any of our support group/programs.    Follow up instructions:    -Return to cancer center in 6 months for f/u with Dr. Al Pimple  -Mammogram due in 12/2023 -She is welcome to return back to the Survivorship Clinic at any time; no additional follow-up needed at this time.  -Consider referral back to survivorship as a long-term survivor for continued surveillance  The patient was provided an opportunity to ask questions and all were answered. The patient agreed with the plan and demonstrated an understanding of the instructions.   Total encounter time:40 minutes*in face-to-face visit time, chart review, lab review, care coordination,  order entry, and documentation of the encounter time.    Lillard Anes, NP 09/22/23 12:58 PM Medical Oncology and Hematology [facility] [address] [place], [place] [address] Tel. [phone]    Fax. [phone]  *Total Encounter Time as defined by the Centers for Medicare and Medicaid Services includes, in addition to the face-to-face time of a patient visit (documented in the note above) non-face-to-face time: obtaining and reviewing outside history, ordering and reviewing medications, tests or procedures, care coordination (communications  with other health care professionals or caregivers) and documentation in the medical record.

## 2023-09-24 ENCOUNTER — Telehealth: Payer: Self-pay | Admitting: Adult Health

## 2023-09-24 NOTE — Telephone Encounter (Signed)
Scheduled appointment per 2/18 los. Patient is aware of the made appointment.

## 2023-09-25 DIAGNOSIS — Z7901 Long term (current) use of anticoagulants: Secondary | ICD-10-CM | POA: Diagnosis not present

## 2023-10-20 ENCOUNTER — Other Ambulatory Visit: Payer: Self-pay | Admitting: *Deleted

## 2023-10-20 ENCOUNTER — Other Ambulatory Visit (HOSPITAL_COMMUNITY): Payer: Self-pay

## 2023-10-20 MED ORDER — ANASTROZOLE 1 MG PO TABS: 1.0000 mg | ORAL_TABLET | Freq: Every day | ORAL | 3 refills | Status: DC

## 2023-10-23 DIAGNOSIS — Z7901 Long term (current) use of anticoagulants: Secondary | ICD-10-CM | POA: Diagnosis not present

## 2023-10-26 ENCOUNTER — Telehealth: Payer: Self-pay | Admitting: Neurology

## 2023-10-26 NOTE — Telephone Encounter (Signed)
 [name] called back in to let Dr.Aquino know its not [facility] its [facility]  1. Which medications need to be refilled? (please list name of each medication and dose if known) VIMPAT 200 MG TABS tablet [[id number]] and folic acid (FOLVITE) 1 MG tablet [[id number]]

## 2023-10-26 NOTE — Telephone Encounter (Signed)
 1. Which medications need to be refilled? (please list name of each medication and dose if known) VIMPAT 200 MG TABS tablet [[id number]] and folic acid (FOLVITE) 1 MG tablet [[id number]]   2. Which pharmacy/location (including street and city if local pharmacy) is medication to be sent to? [facility] - [place], [place] - [address] [facility] AT Portal to Registered [facility] Sites   3. Do they need a 30 day or 90 day supply?

## 2023-10-27 MED ORDER — DILANTIN 100 MG PO CAPS: 100.0000 mg | ORAL_CAPSULE | Freq: Two times a day (BID) | ORAL | 3 refills | Status: DC

## 2023-10-27 MED ORDER — LORAZEPAM 1 MG PO TABS: ORAL_TABLET | ORAL | 5 refills | Status: DC

## 2023-10-27 MED ORDER — VIMPAT 200 MG PO TABS: 200.0000 mg | ORAL_TABLET | Freq: Two times a day (BID) | ORAL | 3 refills | Status: DC

## 2023-10-27 MED ORDER — FOLIC ACID 1 MG PO TABS: 1.0000 mg | ORAL_TABLET | Freq: Every day | ORAL | 3 refills | Status: AC

## 2023-10-27 MED ORDER — DILANTIN 30 MG PO CAPS: ORAL_CAPSULE | ORAL | 3 refills | Status: DC

## 2023-10-27 NOTE — Telephone Encounter (Signed)
 Pls check with daughter, we got a fax from [facility], did she want all meds to go through them, the Dilantin and lorazepam as well? Fax sent to [place] was for Dilantin, Vimpat, and lorazepam. Thanks

## 2023-10-27 NOTE — Telephone Encounter (Signed)
 All Rx sent, thanks

## 2023-10-27 NOTE — Telephone Encounter (Signed)
 Spoke with [name] all of [name]'s medications now need to go to [facility] now because of insurance change

## 2023-10-28 ENCOUNTER — Telehealth: Payer: Self-pay | Admitting: Neurology

## 2023-10-28 NOTE — Telephone Encounter (Signed)
 Alvino Chapel with [facility] called and LM with AN. She is following up on an fax that was sent to their pharmacy.

## 2023-10-28 NOTE — Telephone Encounter (Signed)
 All RX have been sent

## 2023-10-31 ENCOUNTER — Telehealth: Payer: Self-pay | Admitting: Neurology

## 2023-10-31 ENCOUNTER — Other Ambulatory Visit (HOSPITAL_COMMUNITY): Payer: Self-pay

## 2023-10-31 ENCOUNTER — Other Ambulatory Visit (HOSPITAL_BASED_OUTPATIENT_CLINIC_OR_DEPARTMENT_OTHER): Payer: Self-pay

## 2023-10-31 MED ORDER — VIMPAT 200 MG PO TABS: 200.0000 mg | ORAL_TABLET | Freq: Two times a day (BID) | ORAL | 0 refills | Status: DC

## 2023-10-31 MED ORDER — LACOSAMIDE 200 MG PO TABS
200.0000 mg | ORAL_TABLET | Freq: Two times a day (BID) | ORAL | 0 refills | Status: DC
  Filled 2023-10-31: qty 14, 7d supply, fill #0

## 2023-10-31 NOTE — Telephone Encounter (Signed)
 Rec'd call after hours that patient in need of refill for vimpat and has not received mail-order supply.  One week supply sent to local pharmacy.

## 2023-11-02 ENCOUNTER — Other Ambulatory Visit: Payer: Self-pay | Admitting: Neurology

## 2023-11-02 MED ORDER — VIMPAT 200 MG PO TABS: 200.0000 mg | ORAL_TABLET | Freq: Two times a day (BID) | ORAL | 0 refills | Status: DC

## 2023-11-02 NOTE — Telephone Encounter (Signed)
 1. Which medications need refilled? (List name and dosage, if known) Left message with access nurse on 10/31/23 -Vimpat - didn't get normal mail in, they say they haven't received anything  2. Which pharmacy/location is medication to be sent to? (include street and city if local pharmacy) [facility] [address]

## 2023-11-02 NOTE — Telephone Encounter (Signed)
 They called on weekend about her Vimpat, pls check on what they need and where it needs to be sent, thanks

## 2023-11-11 ENCOUNTER — Other Ambulatory Visit: Payer: Self-pay

## 2023-11-11 DIAGNOSIS — Z95828 Presence of other vascular implants and grafts: Secondary | ICD-10-CM

## 2023-11-11 NOTE — Addendum Note (Signed)
 Addended by: Terrence Dupont A on: 11/11/2023 10:01 AM   Modules accepted: Orders

## 2023-12-06 NOTE — Progress Notes (Unsigned)
 se     Brigitte Canard, PA-C [address] [place], [place]  [address] Phone: [phone]   Primary Care Physician: Bufford Carne, FNP  Primary Gastroenterologist:  Brigitte Canard, PA-C / Yong Henle, MD   Chief Complaint:  Discuss Colonoscopy, colon cancer screening, dilated common bile duct, macrocytic anemia   HPI:   [name] is a 68 y.o. female presents to discuss scheduling a screening Colonoscopy.    She has had 1 colonoscopy done by Dr. Lincoln Renshaw in 2002.  Reportedly normal.  Results unavailable.  She is overdue for a 10-year repeat screening colonoscopy.  She is here today with her daughter who helps with her care.  Family history significant for mother with colon cancer and vaginal cancer.  No previous EGD.  PMH: significant for Left Breast Cancer s/p lumpectomy 01/12/2023, IVC filter insertion 10/2020, Afib, Seizures, Atrial Flutter, COPD, DVTs, Stroke in 1990s, GERD, Constipation. Currently on Coumadin .  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  Followed by neurology, cardiology, and vascular.  Cardiologist Dr. Maximo Spar.  Last seizure was 1 week ago.  She had another seizure 1 month ago.   GI symptoms: She has history of chronic constipation.  Took MiraLAX  and Senokot in the past.  Not currently taking any treatment for constipation.  She reports her constipation has improved.  She occasionally takes MiraLAX  as needed.  Currently having bowel movement once daily.  She has history of acid reflux and takes Pepcid  sporadically as needed.  Denies dysphagia, nausea, vomiting, or abdominal pain.  Denies melena or hematochezia.  Admits to slow gradual weight loss over the past year.  Chart shows weight 135 in January 2024.  Weight 135 May 2024.  Weight is 131 today.  She has chronic macrocytic anemia.  Last labs 06/2023 showed hemoglobin 10.5, MCV 104, WBC 2.8.  INR 2.0.  Labs stable.  She takes Vitamin B12 and Folic Acid .  06/2023 CT chest, abdomen,  and pelvis with contrast: Showed emphysema.  No acute abnormality.  Mild dilation of the common bile duct measuring 10 mm.  Aortic atherosclerosis.  09/08/2023 MRI/MRCP abdomen without contrast: Mild dilation of the common bile duct measuring up to 8 mm.  No evidence of choledocholithiasis or biliary stricture.  No gallstones.  3 cm periampullary duodenal diverticulum.  No other abnormality.    Current Outpatient Medications  Medication Sig Dispense Refill   acetaminophen  (TYLENOL ) 325 MG tablet Take 2 tablets (650 mg total) by mouth every 4 (four) hours as needed for mild pain (pain score 1-3).     alendronate (FOSAMAX) 70 MG tablet Take 70 mg by mouth once a week.     anastrozole  (ARIMIDEX ) 1 MG tablet Take 1 tablet (1 mg total) by mouth daily. 90 tablet 3   cyanocobalamin  (VITAMIN B12) 1000 MCG tablet Take 2 tablets (2,000 mcg total) by mouth daily. 60 tablet 0   diclofenac  Sodium (VOLTAREN ) 1 % GEL Apply 2 g topically 4 (four) times daily as needed. To top of right foot 100 g 0   DILANTIN  100 MG ER capsule Take 1 capsule (100 mg total) by mouth 2 (two) times daily. 180 capsule 3   DILANTIN  30 MG ER capsule TAKE 1 CAPSULE BY MOUTH EVERY  NIGHT ALONG WITH 100 MG CAPSULE  FOR A TOTAL EVERY NIGHT DOSE OF  130 MG 100 capsule 3   folic acid  (FOLVITE ) 1 MG tablet Take 1 tablet (1 mg total) by mouth daily. 90 tablet  3   lacosamide  (VIMPAT ) 200 MG TABS tablet Take 1 tablet (200 mg total) by mouth 2 (two) times daily. 14 tablet 0   LORazepam  (ATIVAN ) 1 MG tablet TAKE 1 TABLET BY MOUTH AS DIRECTED AS NEEDED FOR 3 OR MORE SEIZURES IN 24 HOURS. DO NOT TAKE MORE THAN 2 TABLETS IN 24 HOURS. Strength: 1 mg 10 tablet 5   potassium chloride  SA (KLOR-CON  M) 20 MEQ tablet Take 1 tablet (20 mEq total) by mouth every other day. 15 tablet 0   warfarin (COUMADIN ) 6 MG tablet Take 1 tablet (6 mg total) by mouth daily at 4 PM. 30 tablet 1   No current facility-administered medications for this visit.    Allergies as  of 12/07/2023 - Review Complete 12/07/2023  Allergen Reaction Noted   Zonegran [zonisamide] Other (See Comments) 11/09/2014   Chocolate Other (See Comments) 08/25/2022   Codeine Nausea And Vomiting 06/09/2011   Keppra  [levetiracetam ] Other (See Comments) 06/09/2011   Lyrica  [pregabalin ] Nausea And Vomiting 10/31/2015   Olive oil Rash 12/13/2012    Past Medical History:  Diagnosis Date   Acute bronchitis 08/06/2022   Acute respiratory failure with hypoxia (HCC) 03/31/2015   Altered mental status    Asthma    Ataxia 08/26/2019   Atypical chest pain 02/17/2017   Cerebral hemorrhage (HCC) 1989   Chronic back pain    Closed fracture of distal end of left radius 07/17/2022   Closed fracture of fifth metatarsal bone 01/04/2019   Closed fracture of proximal phalanx of great toe 01/04/2019   Closed nondisplaced fracture of styloid process of left radius 08/06/2022   COPD (chronic obstructive pulmonary disease) (HCC)    no meds per daughter   COPD with exacerbation (HCC) 03/30/2015   Costochondritis 02/17/2017   COVID-19 virus infection 04/02/2020   DDD (degenerative disc disease) 06/12/2013   DVT (deep venous thrombosis) (HCC)    "she's had several since 1990"   GERD (gastroesophageal reflux disease)    no meds per daughter   Headache    "usually around time when she's had a seizure"   History of blood transfusion    "when she was a baby"   History of kidney stones    History of stomach ulcers    Hypoxia 01/18/2015   Lactic acidosis 04/02/2020   Neuropathy    Seizures (HCC)    "because of her brain surgery"  last one 07/28/22   Sepsis secondary to UTI (HCC) 01/17/2015   Stroke [facility]) 1990's   family denies residual on 11/09/2014   Tunnel vision    "since brain OR"    Past Surgical History:  Procedure Laterality Date   BRAIN SURGERY  1989   craniotomy with hematoma evacuation   BREAST BIOPSY Left 12/22/2022   US  LT BREAST BX W LOC DEV 1ST LESION IMG BX SPEC US  GUIDE  12/22/2022 [facility]   BREAST BIOPSY  01/09/2023   MM LT RADIOACTIVE SEED LOC MAMMO GUIDE 01/09/2023 [facility]   BREAST LUMPECTOMY WITH RADIOACTIVE SEED AND SENTINEL LYMPH NODE BIOPSY Left 01/12/2023   Procedure: LEFT BREAST LUMPECTOMY WITH RADIOACTIVE SEED AND SENTINEL LYMPH NODE BIOPSY;  Surgeon: Dareen Ebbing, MD;  Location: [facility];  Service: General;  Laterality: Left;   CESAREAN SECTION  1979; 1987; 1989   HEMORRHOID SURGERY     IVC FILTER INSERTION N/A 10/12/2020   Procedure: IVC FILTER INSERTION;  Surgeon: Dannis Dy, MD;  Location: [facility];  Service: Cardiovascular;  Laterality:  N/A;   OPEN REDUCTION INTERNAL FIXATION (ORIF) DISTAL RADIAL FRACTURE Left 08/25/2022   Procedure: OPEN REDUCTION INTERNAL FIXATION (ORIF) DISTAL RADIUS FRACTURE of nascent malunion;  Surgeon: Arvil Birks, MD;  Location: [facility] OR;  Service: Orthopedics;  Laterality: Left;  regional with iv sedation   OPEN REDUCTION INTERNAL FIXATION (ORIF) DISTAL RADIAL FRACTURE Left 06/17/2023   Procedure: OPEN REDUCTION INTERNAL FIXATION (ORIF) DISTAL RADIUS FRACTURE;  Surgeon: Arvil Birks, MD;  Location: [facility] OR;  Service: Orthopedics;  Laterality: Left;   TUBAL LIGATION  1990's    Review of Systems:    All systems reviewed and negative except where noted in HPI.    Physical Exam:  BP 100/60   Pulse 93   Ht 5' 7.75" (1.721 m)   Wt 131 lb (59.4 kg)   BMI 20.07 kg/m  No LMP recorded. Patient is postmenopausal.  General: Well-nourished, well-developed in no acute distress.  Lungs: Clear to auscultation bilaterally. Non-labored. Heart: Regular rate and rhythm, no murmurs rubs or gallops.  Abdomen: Bowel sounds are normal; Abdomen is Soft; No hepatosplenomegaly, masses or hernias;  No Abdominal Tenderness; No guarding or rebound tenderness. Neuro: Alert and oriented x 3.  Grossly intact. She is shacy at rest and when getting on and off exam table. Psych: Alert and  cooperative, normal mood and affect.   Imaging Studies: No results found.  Labs: CBC    Component Value Date/Time   WBC 3.2 (L) 12/07/2023 0910   RBC 3.91 12/07/2023 0910   HGB 13.7 12/07/2023 0910   HGB 13.7 12/31/2022 0833   HCT 40.7 12/07/2023 0910   HCT 38.5 02/17/2017 0930   PLT 129.0 (L) 12/07/2023 0910   PLT 137 (L) 12/31/2022 0833   MCV 104.2 (H) 12/07/2023 0910   MCH 33.3 06/23/2023 0758   MCHC 33.6 12/07/2023 0910   RDW 12.8 12/07/2023 0910   RDW 12.9 06/10/2013 0812   LYMPHSABS 1.0 12/07/2023 0910   LYMPHSABS 1.4 06/10/2013 0812   MONOABS 0.3 12/07/2023 0910   EOSABS 0.1 12/07/2023 0910   EOSABS 0.0 06/10/2013 0812   BASOSABS 0.0 12/07/2023 0910   BASOSABS 0.0 06/10/2013 0812    CMP     Component Value Date/Time   NA 140 06/21/2023 0625   NA 145 (H) 06/10/2013 0812   K 4.0 06/21/2023 0625   CL 104 06/21/2023 0625   CO2 28 06/21/2023 0625   GLUCOSE 92 06/21/2023 0625   BUN 6 (L) 06/21/2023 0625   BUN 13 06/10/2013 0812   CREATININE 0.79 06/21/2023 0625   CREATININE 0.85 12/31/2022 0833   CALCIUM 9.3 06/21/2023 0625   PROT 5.2 (L) 06/19/2023 0708   PROT 6.8 10/10/2020 0935   ALBUMIN 2.9 (L) 06/19/2023 0708   ALBUMIN 4.6 10/10/2020 0935   AST 21 06/19/2023 0708   AST 13 (L) 12/31/2022 0833   ALT 13 06/19/2023 0708   ALT 10 12/31/2022 0833   ALKPHOS 48 06/19/2023 0708   BILITOT 0.3 06/19/2023 0708   BILITOT 0.3 12/31/2022 0833   GFRNONAA >60 06/21/2023 0625   GFRNONAA >60 12/31/2022 0833   GFRAA >60 04/24/2020 1259       Assessment and Plan:   [name] is a 68 y.o. y/o female presents for:   1.  Colon cancer screening  2.  Family history of colon cancer - Mother -Patient and her daughter are requesting to schedule colonoscopy. -Schedule Colonoscopy in Hospital.  May also need EGD/EUS (to evaluate dilated bile duct). -Will need cardiac clearance and  permission to hold Coumadin  5 days prior to procedures, OR do Lovenox  bridging. -I  will discuss her case with MD before scheduling procedures.  3.  Macrocytic anemia -Labs: CBC, iron panel, ferritin, B12, folate -If Iron Deficiency, then schedule diagnostic EGD and Colon In Hospital. Get Cardiac Clearance and advice regarding Coumadin  hold for 5 days prior to procedures.  4.  Reported weight loss - Review of chart shows patient's weight is stable for several years.  5.  Dilated common bile duct on CT and MRI/MRCP. - Discuss case with MD in our office.  Then we will decide about Scheduling an EGD/EUS with MD.  6.  Multiple comorbidities: Left Breast Cancer s/p lumpectomy 01/12/2023, IVC filter insertion 10/2020, Afib, Seizures, Atrial Flutter, COPD, DVTs, Stroke in 1990s, GERD, Constipation. Currently on Coumadin .  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  Followed by neurology, cardiology, and vascular.  Cardiologist Dr. Maximo Spar.  Last seizure was 1 week ago.   7.  Chronic constipation - Encouraged her to go ahead and take MiraLAX  and Metamucil fiber supplement every day.  8.  Chronic GERD - Encouraged her to take Pepcid  40 Mg every day.  Brigitte Canard, PA-C  Follow up based on Procedure results.

## 2023-12-07 ENCOUNTER — Encounter: Payer: Self-pay | Admitting: Physician Assistant

## 2023-12-07 ENCOUNTER — Other Ambulatory Visit (INDEPENDENT_AMBULATORY_CARE_PROVIDER_SITE_OTHER)

## 2023-12-07 ENCOUNTER — Ambulatory Visit: Admitting: Physician Assistant

## 2023-12-07 VITALS — BP 100/60 | HR 93 | Ht 67.75 in | Wt 131.0 lb

## 2023-12-07 DIAGNOSIS — R634 Abnormal weight loss: Secondary | ICD-10-CM

## 2023-12-07 DIAGNOSIS — Z87898 Personal history of other specified conditions: Secondary | ICD-10-CM

## 2023-12-07 DIAGNOSIS — Z7901 Long term (current) use of anticoagulants: Secondary | ICD-10-CM

## 2023-12-07 DIAGNOSIS — K219 Gastro-esophageal reflux disease without esophagitis: Secondary | ICD-10-CM

## 2023-12-07 DIAGNOSIS — D649 Anemia, unspecified: Secondary | ICD-10-CM | POA: Diagnosis not present

## 2023-12-07 DIAGNOSIS — Z800 Family history of malignant neoplasm of digestive organs: Secondary | ICD-10-CM

## 2023-12-07 DIAGNOSIS — K5909 Other constipation: Secondary | ICD-10-CM | POA: Diagnosis not present

## 2023-12-07 DIAGNOSIS — K5904 Chronic idiopathic constipation: Secondary | ICD-10-CM

## 2023-12-07 DIAGNOSIS — K838 Other specified diseases of biliary tract: Secondary | ICD-10-CM | POA: Diagnosis not present

## 2023-12-07 DIAGNOSIS — D539 Nutritional anemia, unspecified: Secondary | ICD-10-CM | POA: Diagnosis not present

## 2023-12-07 DIAGNOSIS — K2100 Gastro-esophageal reflux disease with esophagitis, without bleeding: Secondary | ICD-10-CM

## 2023-12-07 LAB — CBC WITH DIFFERENTIAL/PLATELET
Basophils Absolute: 0 10*3/uL (ref 0.0–0.1)
Basophils Relative: 0.5 % (ref 0.0–3.0)
Eosinophils Absolute: 0.1 10*3/uL (ref 0.0–0.7)
Eosinophils Relative: 1.9 % (ref 0.0–5.0)
HCT: 40.7 % (ref 36.0–46.0)
Hemoglobin: 13.7 g/dL (ref 12.0–15.0)
Lymphocytes Relative: 31.8 % (ref 12.0–46.0)
Lymphs Abs: 1 10*3/uL (ref 0.7–4.0)
MCHC: 33.6 g/dL (ref 30.0–36.0)
MCV: 104.2 fl — ABNORMAL HIGH (ref 78.0–100.0)
Monocytes Absolute: 0.3 10*3/uL (ref 0.1–1.0)
Monocytes Relative: 9.2 % (ref 3.0–12.0)
Neutro Abs: 1.8 10*3/uL (ref 1.4–7.7)
Neutrophils Relative %: 56.6 % (ref 43.0–77.0)
Platelets: 129 10*3/uL — ABNORMAL LOW (ref 150.0–400.0)
RBC: 3.91 Mil/uL (ref 3.87–5.11)
RDW: 12.8 % (ref 11.5–15.5)
WBC: 3.2 10*3/uL — ABNORMAL LOW (ref 4.0–10.5)

## 2023-12-07 LAB — B12 AND FOLATE PANEL
Folate: >25.2 ng/mL (ref >5.9)
Vitamin B-12: 350 pg/mL (ref 211–911)

## 2023-12-07 NOTE — Patient Instructions (Signed)
 Your provider has requested that you go to the basement level for lab work before leaving today. Press "B" on the elevator. The lab is located at the first door on the left as you exit the elevator.  Please follow up sooner if symptoms increase or worsen  Due to recent changes in healthcare laws, you may see the results of your imaging and laboratory studies on MyChart before your provider has had a chance to review them.  We understand that in some cases there may be results that are confusing or concerning to you. Not all laboratory results come back in the same time frame and the provider may be waiting for multiple results in order to interpret others.  Please give us  48 hours in order for your provider to thoroughly review all the results before contacting the office for clarification of your results.   _______________________________________________________  If your blood pressure at your visit was 140/90 or greater, please contact your primary care physician to follow up on this.  _______________________________________________________  If you are age 5 or older, your body mass index should be between 23-30. Your Body mass index is 20.07 kg/m. If this is out of the aforementioned range listed, please consider follow up with your Primary Care Provider.  If you are age 55 or younger, your body mass index should be between 19-25. Your Body mass index is 20.07 kg/m. If this is out of the aformentioned range listed, please consider follow up with your Primary Care Provider.   ________________________________________________________  The [facility] providers would like to encourage you to use MYCHART to communicate with providers for non-urgent requests or questions.  Due to long hold times on the telephone, sending your provider a message by [facility] may be a faster and more efficient way to get a response.  Please allow 48 business hours for a response.  Please remember that this is for  non-urgent requests.  _______________________________________________________ Thank you for trusting me with your gastrointestinal care!   Brigitte Canard, PA

## 2023-12-08 DIAGNOSIS — Z0000 Encounter for general adult medical examination without abnormal findings: Secondary | ICD-10-CM | POA: Diagnosis not present

## 2023-12-08 DIAGNOSIS — E559 Vitamin D deficiency, unspecified: Secondary | ICD-10-CM | POA: Diagnosis not present

## 2023-12-08 DIAGNOSIS — Z86718 Personal history of other venous thrombosis and embolism: Secondary | ICD-10-CM | POA: Diagnosis not present

## 2023-12-08 DIAGNOSIS — C50412 Malignant neoplasm of upper-outer quadrant of left female breast: Secondary | ICD-10-CM | POA: Diagnosis not present

## 2023-12-08 DIAGNOSIS — E785 Hyperlipidemia, unspecified: Secondary | ICD-10-CM | POA: Diagnosis not present

## 2023-12-08 DIAGNOSIS — Z95828 Presence of other vascular implants and grafts: Secondary | ICD-10-CM | POA: Diagnosis not present

## 2023-12-08 DIAGNOSIS — G40909 Epilepsy, unspecified, not intractable, without status epilepticus: Secondary | ICD-10-CM | POA: Diagnosis not present

## 2023-12-08 DIAGNOSIS — Z23 Encounter for immunization: Secondary | ICD-10-CM | POA: Diagnosis not present

## 2023-12-08 DIAGNOSIS — E538 Deficiency of other specified B group vitamins: Secondary | ICD-10-CM | POA: Diagnosis not present

## 2023-12-08 DIAGNOSIS — Z1331 Encounter for screening for depression: Secondary | ICD-10-CM | POA: Diagnosis not present

## 2023-12-08 DIAGNOSIS — J439 Emphysema, unspecified: Secondary | ICD-10-CM | POA: Diagnosis not present

## 2023-12-08 DIAGNOSIS — N393 Stress incontinence (female) (male): Secondary | ICD-10-CM | POA: Diagnosis not present

## 2023-12-08 LAB — IRON,TIBC AND FERRITIN PANEL
%SAT: 79 % — ABNORMAL HIGH (ref 16–45)
Ferritin: 20 ng/mL (ref 16–288)
Iron: 231 ug/dL — ABNORMAL HIGH (ref 45–160)
TIBC: 294 ug/dL (ref 250–450)

## 2023-12-09 ENCOUNTER — Encounter (HOSPITAL_COMMUNITY): Payer: Self-pay

## 2023-12-14 ENCOUNTER — Encounter: Payer: Self-pay | Admitting: Neurology

## 2023-12-14 ENCOUNTER — Other Ambulatory Visit

## 2023-12-14 ENCOUNTER — Ambulatory Visit: Payer: Medicare HMO | Admitting: Neurology

## 2023-12-14 VITALS — BP 129/61 | HR 99 | Ht 67.0 in | Wt 132.4 lb

## 2023-12-14 DIAGNOSIS — G40219 Localization-related (focal) (partial) symptomatic epilepsy and epileptic syndromes with complex partial seizures, intractable, without status epilepticus: Secondary | ICD-10-CM

## 2023-12-14 DIAGNOSIS — Z79899 Other long term (current) drug therapy: Secondary | ICD-10-CM

## 2023-12-14 MED ORDER — VIMPAT 200 MG PO TABS: 200.0000 mg | ORAL_TABLET | Freq: Two times a day (BID) | ORAL | 3 refills | Status: DC

## 2023-12-14 MED ORDER — DILANTIN 30 MG PO CAPS: ORAL_CAPSULE | ORAL | 3 refills | Status: DC

## 2023-12-14 MED ORDER — DILANTIN 100 MG PO CAPS: ORAL_CAPSULE | ORAL | 3 refills | Status: DC

## 2023-12-14 NOTE — Progress Notes (Signed)
 Addendum: Reviewed and agree with assessment and management plan.  I agree with Dr. 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 In the absence of elevated liver enzymes I agree with upper endoscopy and colonoscopy in the outpatient hospital setting.  EUS deferred if liver enzymes normal Would repeat hepatic function panel and if normal proceed with upper and lower endoscopy in the outpatient hospital setting with me Jozee Hammer, Amber Bail, MD

## 2023-12-14 NOTE — Progress Notes (Signed)
 NEUROLOGY FOLLOW UP OFFICE NOTE  [name] [id number] 08/25/1955  HISTORY OF PRESENT ILLNESS: I had the pleasure of seeing [name] in follow-up in the neurology clinic on 12/14/2023.  The patient was last seen 8 months ago for intractable epilepsy and neuropathy. She is again accompanied by her daughter [name] who helps supplement the history today.  Records and images were personally reviewed where available. She is agitated in the office today, upset about her independence and inability to drive. Since her last visit, our office was contacted about a seizure on 08/21/23 due to missing medication the night prior and being anxious about her MRI. She had a fall from the seizure. [name] reports she had 3 back to back, this was the last time prn Ativan  was given. She is angry that [name] is reporting seizures in April, adamant she has not had any further seizures in almost 6 months despite video from April showing a focal impaired awareness seizure that secondarily generalized. Video reviewed, she is sitting on the bed then bends forward in a sitting position, then she lays back on the bed with arms flexed to chest followed by tonic extension of left arm. Her mother-in-law witnessed the seizure and was calling her with no response. The seizure lasted around 45 seconds. [name] a lot of the seizures are due to missing doses. There appears to be a change in the dynamic at home, [name] had been managing medications however today reports that the patient has been doing her own medications. The patient self-increased the Dilantin  to 130mg  BID and states that since then, she has been feeling fine. She denies any dizziness. In the past, she has had multiple hospital admissions for supratherapeutic Dilantin  levels so we had agreed to stay on 100mg  in AM, 130mg  in PM previously. She is also on Vimpat  200mg  BID without side effects. She reports sleeping well. She is on Coumadin  with INR of 1.7 yesterday, they  are going back on Thursday to recheck INR because it is unclear if subtherapeutic INR is due to missed dose or needs adjustment. She reports stopping the Lexapro  because it knocked her out. She reports her legs and back hurt her, she takes Tylenol  as needed every couple of days.  She has some back pain that they attribute to laying on a flat board from radiation. She will be completing radiation this week. She reports that the supplements Neuroshield (B vitamins) help her neuropathy. She has not tolerated neuropathic medication trials in the past (Gabapentin , Lyrica ). She is on Coumadin  for DVT.    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 She had been seeing epileptologist Dr. Samson Croak, records were reviewed and will be summarized as follows. In the past, seizures would start with headache and burning sensation of the left arm, which may be followed by left arm jerking. Over time, she has not had any prior warning symptoms. She has had seizures where she is noted to have kicking and rocking movements of the pelvis with unresponsiveness. She was in the EMU in 10/2012 where 11 seizures were captured from the right posterior hemisphere/right inferior temporal region. Interictal EEG showed abundant right posterior temporal epileptiform discharges, frequent right anterior temporal epileptiform discharges, as well as occasional independent left anterior temporal epileptiform discharges. There was focal slowing  over the right posterior hemisphere. MRI brain in 2014 showed right parietal/occipital encephalomalacia, right hippocampus smaller than left. PET scan in 2015 showed multiple areas of hypometabolism on the right, mostly corresponding to or adjacent to the structural abnormality.  Ictal SPECT showed right hemisphere hypoperfusion. SISCOM showed right frontal region significant on the subtraction. MEG in 07/2013 showed spikes localizing to the right occipital, parietal, mesial temporal regions with a weak signal left mesial parietal. Concomitant EEG showed right posterior spikes. Functional mapping suggestive of left language lateralization. She has tried multiple AEDs in the past with various side effects or ineffective, and has been taking Dilantin  160mg  in AM, 200mg  in PM and Vimpat  200mg  BID with seizures around once a month. She was admitted to [facility] on 03/19/18 for left leg weakness and drowsiness. She was fishing with her boyfriend when she suddenly could not move her left leg and almost fell. She states that her boyfriend told her that she was shaking and had a seizure while they were fishing. She was brought to [facility] where Dilantin  level was 33.8. She was adamant that she has been taking her medications as instructed and did not take more than prescribed, no new medications taken. She had an MRI brain without contrast which I personally reviewed, no acute changes seen. Large volume right parieto-occipital encephalomalacia was unchanged. Right hippocampus is smaller. She continued to report weakness and had an MRI cervical and lumbar spine which did not show any myelopathy, there was prominent left-sided facet hypertrophy at C4-5 with resultant moderate left C5 foraminal stenosis, degenerative disc osteophyte at C5-6 with severe right C6 foraminal stenosis, and mild spinal stenosis at C5-6 and C6-7, chronic borderline to mild multifactorial spinal stenosis at L2-3 through L4-5 with no convincing lateral recess or foraminal stenosis. She continued to report burning pain in her legs and was started on low dose gabapentin  300mg  qhs with no side effects. Dilantin  was held in the hospital, she was discharged on a lower dose, but states that she has been taking her pre-hospital dose of 160mg  in  AM, 200mg  in PM but has had 4 seizures since hospital discharge 2 weeks ago, which is unusual. She had 2 seizures in one day in the car with her mother-in-law, her mother-in-law had to pull over. The next day she had 2 at home, her daughter found her in the bathroom. She has not been sleeping well the past few days. She has a headache after the seizures. Aside from the convulsions, she has episodes where a feeling comes over her left side, from the arm down her leg, then she is weaker after. She denies any jerking. She is quite anxious in the office today and perseverates on her symptoms. She states she "stays nervous." She reports taking prn Ativan  in the past, but discontinued at [facility]. She repeatedly states her feet are numb and they "feel like they are grabbed." She feels gabapentin  has helped some with this.    Prior AEDs: phenobarbital (ineffective, mood issues), Depakote  (multiple trials, ineffective), Keppra  (ineffective), Zonisamide (side effects on brief trial of higher dose than prescribed - 500mg  instead of 200mg ), Onfi  (mouth sores), Tegretol (ineffective), Lamictal (nausea, dizziness, malaise), Felbatol (nausea, dizziness, malaise), Topamax  (ineffective), Tigabine (tried per notes, details unclear), Neurontin  (ineffective, nausea), Lyrica  (leg pain reported on 25mg  daily, tingling), Briviact  (drowsiness), Fycompa , Cenobamate    Epilepsy Risk Factors:  Right parieto-occipital AVM s/p rupture and hemorrhage in 1988, s/p craniectomy. Otherwise she had a normal birth and early development.  There  is no history of febrile convulsions, CNS infections such as meningitis/encephalitis, or family history of seizures.  Diagnostic Data:  MRI brain without contrast 05/2018 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  EEG done 06/2018 showed right posterior temporal slowing, occasional epileptiform discharges over the  right posterior temporal region, breach artifact in this region.   MRI cervical, thoracic, and lumbar spine done 07/2018 showed no convincing cervical or thoracic spinal cord abnormality. Mild degenerative cervical spinal stenosis at C5-6 and C6-7 with moderate or severe bilateral foraminal stenosis; no thoracic spinal or foraminal stenosis; stable borderline to mild lumbar spinal stenosis  MRI lumbar spine in 2019 showed borderline to mild multifactorial spinal stenosis at L2-L3 and L3-L4 related to circumferential disc bulge and posterior element Hypertrophy.  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     PAST MEDICAL HISTORY: Past Medical History:  Diagnosis Date   Acute bronchitis 08/06/2022   Acute respiratory failure with hypoxia (HCC) 03/31/2015   Altered mental status    Asthma    Ataxia 08/26/2019   Atypical chest pain 02/17/2017   Cerebral hemorrhage (HCC) 1989   Chronic back pain    Closed fracture of distal end of left radius 07/17/2022   Closed fracture of fifth metatarsal bone 01/04/2019   Closed fracture of proximal phalanx of great toe 01/04/2019   Closed nondisplaced fracture of styloid process of left radius 08/06/2022   COPD (chronic obstructive pulmonary disease) (HCC)    no meds per daughter   COPD with exacerbation (HCC) 03/30/2015   Costochondritis 02/17/2017   COVID-19 virus infection 04/02/2020   DDD (degenerative disc disease) 06/12/2013   DVT (deep venous thrombosis) (HCC)    "she's had several since 1990"   GERD (gastroesophageal reflux disease)    no meds per daughter   Headache    "usually around time when she's had a seizure"   History of blood transfusion    "when she was a baby"   History of kidney stones    History of stomach ulcers    Hypoxia 01/18/2015   Lactic acidosis 04/02/2020   Neuropathy    Seizures (HCC)    "because of her brain surgery"  last  one 07/28/22   Sepsis secondary to UTI (HCC) 01/17/2015   Stroke [facility]) 1990's   family denies residual on 11/09/2014   Tunnel vision    "since brain OR"    MEDICATIONS: Current Outpatient Medications on File Prior to Visit  Medication Sig Dispense Refill   acetaminophen  (TYLENOL ) 325 MG tablet Take 2 tablets (650 mg total) by mouth every 4 (four) hours as needed for mild pain (pain score 1-3).     anastrozole  (ARIMIDEX ) 1 MG tablet Take 1 tablet (1 mg total) by mouth daily. 90 tablet 3   DILANTIN  100 MG ER capsule Take 1 capsule (100 mg total) by mouth 2 (two) times daily. 180 capsule 3   DILANTIN  30 MG ER capsule TAKE 1 CAPSULE BY MOUTH EVERY  NIGHT ALONG WITH 100 MG CAPSULE  FOR A TOTAL EVERY NIGHT DOSE OF  130 MG (Patient taking differently: 30 mg 2 (two) times daily. TAKE 1 CAPSULE BY MOUTH EVERY  NIGHT ALONG WITH 100 MG CAPSULE  FOR A TOTAL EVERY NIGHT DOSE OF  130 MG) 100 capsule 3   folic acid  (FOLVITE ) 1 MG tablet Take 1 tablet (1 mg total) by mouth  daily. 90 tablet 3   lacosamide  (VIMPAT ) 200 MG TABS tablet Take 1 tablet (200 mg total) by mouth 2 (two) times daily. 14 tablet 0   LORazepam  (ATIVAN ) 1 MG tablet TAKE 1 TABLET BY MOUTH AS DIRECTED AS NEEDED FOR 3 OR MORE SEIZURES IN 24 HOURS. DO NOT TAKE MORE THAN 2 TABLETS IN 24 HOURS. Strength: 1 mg 10 tablet 5   potassium chloride  SA (KLOR-CON  M) 20 MEQ tablet Take 1 tablet (20 mEq total) by mouth every other day. 15 tablet 0   warfarin (COUMADIN ) 6 MG tablet Take 1 tablet (6 mg total) by mouth daily at 4 PM. 30 tablet 1   cyanocobalamin  (VITAMIN B12) 1000 MCG tablet Take 2 tablets (2,000 mcg total) by mouth daily. (Patient not taking: Reported on 12/14/2023) 60 tablet 0   No current facility-administered medications on file prior to visit.    ALLERGIES: Allergies  Allergen Reactions   Zonegran [Zonisamide] Other (See Comments)    Numbness and tingling over whole body   Chocolate Other (See Comments)    Triggers seizures    Codeine Nausea And Vomiting   Keppra  [Levetiracetam ] Other (See Comments)    Causes seizures   Lyrica  [Pregabalin ] Nausea And Vomiting   Olive Oil Rash    FAMILY HISTORY: Family History  Problem Relation Age of Onset   Colon cancer Mother        started as vaginal cancer   Heart attack Mother        4 MIs, started in her 21s, also vaginal cancer and colon cancer   Cancer Mother    Heart attack Father        Died suddenly age 90 - autopsy revealed heart attack her patient   Cancer Brother        Sinus cancer   Cervical cancer Maternal Aunt    Rectal cancer Neg Hx    Stomach cancer Neg Hx    Esophageal cancer Neg Hx     SOCIAL HISTORY: Social History   Socioeconomic History   Marital status: Divorced    Spouse name: Not on file   Number of children: 3   Years of education: Not on file   Highest education level: Not on file  Occupational History   Not on file  Tobacco Use   Smoking status: Former    Current packs/day: 0.00    Average packs/day: 0.5 packs/day for 20.0 years (10.0 ttl pk-yrs)    Types: Cigarettes    Start date: 10/26/1997    Quit date: 10/26/2017    Years since quitting: 6.1   Smokeless tobacco: Never   Tobacco comments:    Previously smoked 2 ppd, down to 4 cigs/day, patient quit smoking in 2019  Vaping Use   Vaping status: Never Used  Substance and Sexual Activity   Alcohol use: No   Drug use: No   Sexual activity: Never    Birth control/protection: Post-menopausal  Other Topics Concern   Not on file  Social History Narrative   Pt lives in 2 story home with her daughter, daughter's family and pt's mother-in-law   Has 2 living children / 1 deceased   January 21, 2024 grade education   Worked for [employer] until 1998 when she became disabled.    Right handed   Social Drivers of Health   Financial Resource Strain: Low Risk  (12/31/2022)   Overall Financial Resource Strain (CARDIA)    Difficulty of Paying Living Expenses: Not very hard  Food Insecurity:  No  Food Insecurity (06/15/2023)   Hunger Vital Sign    Worried About Running Out of Food in the Last Year: Never true    Ran Out of Food in the Last Year: Never true  Transportation Needs: No Transportation Needs (06/15/2023)   PRAPARE - Administrator, Civil Service (Medical): No    Lack of Transportation (Non-Medical): No  Physical Activity: Not on file  Stress: Not on file  Social Connections: Not on file  Intimate Partner Violence: Not At Risk (06/15/2023)   Humiliation, Afraid, Rape, and Kick questionnaire    Fear of Current or Ex-Partner: No    Emotionally Abused: No    Physically Abused: No    Sexually Abused: No     PHYSICAL EXAM: Vitals:   12/14/23 1358  BP: 129/61  Pulse: 99  SpO2: 93%   General: No acute distress, angry and irritable today Head:  Normocephalic/atraumatic, +torticollis with head bent to the right (similar to prior) Skin/Extremities: No rash, no edema Neurological Exam: alert and awake. No aphasia or dysarthria. Fund of knowledge is reduced.  Recent and remote memory are impaired. Attention and concentration are reduced.  Cranial nerves: Pupils equal, round. Extraocular movements intact with no nystagmus. Visual fields full.  No facial asymmetry.  Motor: Bulk and tone normal, muscle strength 5/5 throughout with no pronator drift.   Finger to nose testing intact.  Gait unsteady, favors right leg, no ataxia.    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 She has tried multiple AEDs in the past with various side effects. She has had several admissions for Dilantin  toxicity and has been unable to tolerate higher doses of Dilantin  but today reports she self-increased Dilantin  to 130mg  BID and has been doing well. She has reduced insight/in denial about continued seizures, repeatedly  stating she has not had seizures despite video evidence of seizure from last month. We agreed to continue Dilantin  130mg  BID and Vimpat  200mg  BID, check levels on current regimen. There is concern about medication compliance however she is visibly upset with her daughter and agitated in the office today. She reports stopping Lexapro  and is not interested in starting a different SSRI. Advised to continue pillbox with alarm. She is aware of [place] driving laws. Follow-up in 4 months, call for any changes.   Thank you for allowing me to participate in her care.  Please do not hesitate to call for any questions or concerns.    Rayfield Cairo, M.D.   CC: Marleta Simmer, FNP

## 2023-12-14 NOTE — Patient Instructions (Signed)
 Good to see you.  Have bloodwork done for total and free Dilantin  levels and Lacosamide  level  2. Continue Dilantin  130mg  twice a day and Vimpat  200mg  twice a day  3. Continue using pill alarm and pillbox  4. Please let me know if you would like to consider starting a different medication to help calm down the brain  5. Follow-up in 4 months, call for any changes

## 2023-12-16 ENCOUNTER — Other Ambulatory Visit: Payer: Self-pay

## 2023-12-16 DIAGNOSIS — D649 Anemia, unspecified: Secondary | ICD-10-CM

## 2023-12-16 DIAGNOSIS — R634 Abnormal weight loss: Secondary | ICD-10-CM

## 2023-12-16 DIAGNOSIS — K2100 Gastro-esophageal reflux disease with esophagitis, without bleeding: Secondary | ICD-10-CM

## 2023-12-16 DIAGNOSIS — K5904 Chronic idiopathic constipation: Secondary | ICD-10-CM

## 2023-12-16 MED ORDER — NA SULFATE-K SULFATE-MG SULF 17.5-3.13-1.6 GM/177ML PO SOLN: ORAL | 0 refills | Status: DC

## 2023-12-16 NOTE — Progress Notes (Signed)
 [name]

## 2023-12-16 NOTE — Progress Notes (Addendum)
 Spoke with pts daughter and she is aware of recommendations. Clearance request has been faxed to Dr. Judene Noss @[phone], pt is on coumadin  and may require lovenox . Pts endo/colon is scheduled at [facility] 02/25/24 at 10:15am, [phone]. Prep sent to pharmacy and prep instuctions sent via mychart/mail.

## 2023-12-17 ENCOUNTER — Ambulatory Visit (HOSPITAL_COMMUNITY)
Admission: RE | Admit: 2023-12-17 | Discharge: 2023-12-17 | Disposition: A | Discharge status: Home / Self Care | Source: Ambulatory Visit | Attending: Vascular Surgery | Admitting: Vascular Surgery

## 2023-12-17 DIAGNOSIS — Z95828 Presence of other vascular implants and grafts: Secondary | ICD-10-CM | POA: Diagnosis not present

## 2023-12-17 DIAGNOSIS — Z86718 Personal history of other venous thrombosis and embolism: Secondary | ICD-10-CM | POA: Diagnosis not present

## 2023-12-17 DIAGNOSIS — Z452 Encounter for adjustment and management of vascular access device: Secondary | ICD-10-CM | POA: Diagnosis not present

## 2023-12-18 ENCOUNTER — Other Ambulatory Visit: Payer: Self-pay

## 2023-12-18 ENCOUNTER — Telehealth: Payer: Self-pay | Admitting: Neurology

## 2023-12-18 DIAGNOSIS — G40219 Localization-related (focal) (partial) symptomatic epilepsy and epileptic syndromes with complex partial seizures, intractable, without status epilepticus: Secondary | ICD-10-CM

## 2023-12-18 DIAGNOSIS — Z79899 Other long term (current) drug therapy: Secondary | ICD-10-CM

## 2023-12-18 LAB — LACOSAMIDE, SERUM/PLASMA: Lacosamide, Serum/Plasma: 5.9 ug/mL

## 2023-12-18 NOTE — Telephone Encounter (Signed)
 We need to check pt number when she calls back the [phone] calls some name Genella Kendall? Called pt daughter went to voice mail left message to call back. Pt needs to come in to have phentoin lab done we need to let her know we are sorry that it was not done the day of her appointment and that the other lab the result has not been released yet,

## 2023-12-18 NOTE — Telephone Encounter (Signed)
 Pt. Would like results call

## 2023-12-21 NOTE — Telephone Encounter (Signed)
 Pt called informed that labs have not resulted and that we need her to come back to have dilantin  level drawn because it was not done and that we are sorry it wasn't done, pt said that she would get her son or daughter to have them bring her here for labs,

## 2023-12-21 NOTE — Telephone Encounter (Signed)
 Pt. Called back for results phone number [phone]

## 2023-12-22 ENCOUNTER — Ambulatory Visit
Admission: RE | Admit: 2023-12-22 | Discharge: 2023-12-22 | Disposition: A | Discharge status: Home / Self Care | Source: Ambulatory Visit | Attending: Adult Health | Admitting: Adult Health

## 2023-12-22 ENCOUNTER — Other Ambulatory Visit: Payer: Self-pay | Admitting: *Deleted

## 2023-12-22 DIAGNOSIS — Z86718 Personal history of other venous thrombosis and embolism: Secondary | ICD-10-CM

## 2023-12-22 DIAGNOSIS — Z08 Encounter for follow-up examination after completed treatment for malignant neoplasm: Secondary | ICD-10-CM | POA: Diagnosis not present

## 2023-12-22 DIAGNOSIS — Z853 Personal history of malignant neoplasm of breast: Secondary | ICD-10-CM | POA: Diagnosis not present

## 2023-12-22 DIAGNOSIS — C50412 Malignant neoplasm of upper-outer quadrant of left female breast: Secondary | ICD-10-CM

## 2023-12-22 DIAGNOSIS — Z95828 Presence of other vascular implants and grafts: Secondary | ICD-10-CM

## 2023-12-29 ENCOUNTER — Telehealth: Payer: Self-pay | Admitting: Neurology

## 2023-12-29 NOTE — Telephone Encounter (Signed)
 Pt daughter called back informed that we needed pt to come in to have dilantin  level done she stated that pt lives with her son now, but she would send a message to have the pt come get lab work done. Pt daughter was upset because this is not the 1st time this has happened, she said it also took 30 mins to get labs last time and then they messed up and she was frustrated,

## 2023-12-29 NOTE — Telephone Encounter (Signed)
Pt daughter called no answer left a voice mail to call the office back  

## 2023-12-29 NOTE — Telephone Encounter (Signed)
 Just to f/u, can you pls call the daughter [name] about getting the Dilantin  level done? Pls check what happened, patient was dismissed in the past because phone reminders were coming to patient, so [name] set herself as the primary contact after this happened.

## 2023-12-29 NOTE — Telephone Encounter (Signed)
 Left a message with the after hour service 12-29-23 at 12:45 pm   Caller states that she is returning a call to [facility]

## 2023-12-29 NOTE — Telephone Encounter (Signed)
 See other phone note

## 2023-12-30 ENCOUNTER — Telehealth: Payer: Self-pay | Admitting: Neurology

## 2023-12-30 NOTE — Telephone Encounter (Signed)
 Pt. Said Brain surgery was done 37 yrs ago and they take Rx's for seizures, needs advice on if it is ok to get dental implants

## 2023-12-30 NOTE — Progress Notes (Unsigned)
 Office Note   Follow up IVC filter  HPI: [name] is a 68 y.o. (12/02/1955) female presenting in follow-up status post IVC filter placement.  She was originally seen by my partner Dr. Shaunna Delaware, who retired last year.  Dr. Kermit Ped last saw her 1 year ago.   In short, she had an extensive DVT of the left lower extremity in March 2022.  She had a contraindication to heparin  because of an intracranial aneurysm and a history of frequent falls.  For this reason an IVC filter was placed and our plan was not to remove this.  The filter was placed on 10/12/2020.  Her duplex scan showed no evidence of thrombus in the IVC.  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  On exam today, [name] was doing well, accompanied by her daughter.  Unfortunately, she suffers from seizures, and has had two over the last few days.  She plans to reach out to her neurologist today. From a lower extremity standpoint, she is doing well.  Notes no significant edema, swelling.  Did not wear compression stockings.  Tries to be as active as possible, but uses a wheelchair intermittently for long distances    Past Medical History:  Diagnosis Date   Acute bronchitis 08/06/2022   Acute respiratory failure with hypoxia (HCC) 03/31/2015   Altered mental status    Asthma    Ataxia 08/26/2019   Atypical chest pain 02/17/2017   Cerebral hemorrhage (HCC) 1989   Chronic back pain    Closed fracture of distal end of left radius 07/17/2022   Closed fracture of fifth metatarsal bone 01/04/2019   Closed fracture of proximal phalanx of great toe 01/04/2019   Closed nondisplaced fracture of styloid process of left radius 08/06/2022   COPD (chronic obstructive pulmonary disease) (HCC)    no meds per daughter   COPD with exacerbation (HCC) 03/30/2015   Costochondritis 02/17/2017   COVID-19 virus infection 04/02/2020   DDD (degenerative disc disease) 06/12/2013   DVT (deep venous thrombosis) (HCC)    "she's had  several since 1990"   GERD (gastroesophageal reflux disease)    no meds per daughter   Headache    "usually around time when she's had a seizure"   History of blood transfusion    "when she was a baby"   History of kidney stones    History of stomach ulcers    Hypoxia 01/18/2015   Lactic acidosis 04/02/2020   Neuropathy    Seizures (HCC)    "because of her brain surgery"  last one 07/28/22   Sepsis secondary to UTI (HCC) 01/17/2015   Stroke [facility]) 1990's   family denies residual on 11/09/2014   Tunnel vision    "since brain OR"    Past Surgical History:  Procedure Laterality Date   BRAIN SURGERY  1989   craniotomy with hematoma evacuation   BREAST BIOPSY Left 12/22/2022   US  LT BREAST BX W LOC DEV 1ST LESION IMG BX SPEC US  GUIDE 12/22/2022 [facility]   BREAST BIOPSY  01/09/2023   MM LT RADIOACTIVE SEED LOC MAMMO GUIDE 01/09/2023 [facility]   BREAST LUMPECTOMY WITH RADIOACTIVE SEED AND SENTINEL LYMPH NODE BIOPSY Left 01/12/2023   Procedure: LEFT BREAST LUMPECTOMY WITH RADIOACTIVE SEED AND SENTINEL LYMPH NODE BIOPSY;  Surgeon: Dareen Ebbing, MD;  Location: [facility];  Service: General;  Laterality: Left;   CESAREAN SECTION  1979; 1987; 1989   HEMORRHOID SURGERY  IVC FILTER INSERTION N/A 10/12/2020   Procedure: IVC FILTER INSERTION;  Surgeon: Dannis Dy, MD;  Location: [facility] INVASIVE CV LAB;  Service: Cardiovascular;  Laterality: N/A;   OPEN REDUCTION INTERNAL FIXATION (ORIF) DISTAL RADIAL FRACTURE Left 08/25/2022   Procedure: OPEN REDUCTION INTERNAL FIXATION (ORIF) DISTAL RADIUS FRACTURE of nascent malunion;  Surgeon: Arvil Birks, MD;  Location: [facility] OR;  Service: Orthopedics;  Laterality: Left;  regional with iv sedation   OPEN REDUCTION INTERNAL FIXATION (ORIF) DISTAL RADIAL FRACTURE Left 06/17/2023   Procedure: OPEN REDUCTION INTERNAL FIXATION (ORIF) DISTAL RADIUS FRACTURE;  Surgeon: Arvil Birks, MD;  Location: [facility] OR;  Service: Orthopedics;   Laterality: Left;   TUBAL LIGATION  1990's    Social History   Socioeconomic History   Marital status: Divorced    Spouse name: Not on file   Number of children: 3   Years of education: Not on file   Highest education level: Not on file  Occupational History   Not on file  Tobacco Use   Smoking status: Former    Current packs/day: 0.00    Average packs/day: 0.5 packs/day for 20.0 years (10.0 ttl pk-yrs)    Types: Cigarettes    Start date: 10/26/1997    Quit date: 10/26/2017    Years since quitting: 6.1   Smokeless tobacco: Never   Tobacco comments:    Previously smoked 2 ppd, down to 4 cigs/day, patient quit smoking in 2019  Vaping Use   Vaping status: Never Used  Substance and Sexual Activity   Alcohol use: No   Drug use: No   Sexual activity: Never    Birth control/protection: Post-menopausal  Other Topics Concern   Not on file  Social History Narrative   Pt lives in 2 story home with her daughter, daughter's family and pt's mother-in-law   Has 2 living children / 1 deceased   2024/01/15 grade education   Worked for [employer] until 1998 when she became disabled.    Right handed   Social Drivers of Health   Financial Resource Strain: Low Risk  (12/31/2022)   Overall Financial Resource Strain (CARDIA)    Difficulty of Paying Living Expenses: Not very hard  Food Insecurity: No Food Insecurity (06/15/2023)   Hunger Vital Sign    Worried About Running Out of Food in the Last Year: Never true    Ran Out of Food in the Last Year: Never true  Transportation Needs: No Transportation Needs (06/15/2023)   PRAPARE - Administrator, Civil Service (Medical): No    Lack of Transportation (Non-Medical): No  Physical Activity: Not on file  Stress: Not on file  Social Connections: Not on file  Intimate Partner Violence: Not At Risk (06/15/2023)   Humiliation, Afraid, Rape, and Kick questionnaire    Fear of Current or Ex-Partner: No    Emotionally Abused: No    Physically  Abused: No    Sexually Abused: No   Family History  Problem Relation Age of Onset   Colon cancer Mother        started as vaginal cancer   Heart attack Mother        4 MIs, started in her 47s, also vaginal cancer and colon cancer   Cancer Mother    Heart attack Father        Died suddenly age 64 - autopsy revealed heart attack her patient   Cancer Brother        Sinus cancer   Cervical cancer Maternal  Aunt    Rectal cancer Neg Hx    Stomach cancer Neg Hx    Esophageal cancer Neg Hx     Current Outpatient Medications  Medication Sig Dispense Refill   acetaminophen  (TYLENOL ) 325 MG tablet Take 2 tablets (650 mg total) by mouth every 4 (four) hours as needed for mild pain (pain score 1-3).     anastrozole  (ARIMIDEX ) 1 MG tablet Take 1 tablet (1 mg total) by mouth daily. 90 tablet 3   cyanocobalamin  (VITAMIN B12) 1000 MCG tablet Take 2 tablets (2,000 mcg total) by mouth daily. (Patient not taking: Reported on 12/14/2023) 60 tablet 0   DILANTIN  100 MG ER capsule Take 1 capsule twice a day (take with 30mg  capsule for total of 130mg  twice a day) 200 capsule 3   DILANTIN  30 MG ER capsule Take 1 capsule twice a day (Take with 100mg  capsule for total of 130mg  twice a day) 200 capsule 3   folic acid  (FOLVITE ) 1 MG tablet Take 1 tablet (1 mg total) by mouth daily. 90 tablet 3   LORazepam  (ATIVAN ) 1 MG tablet TAKE 1 TABLET BY MOUTH AS DIRECTED AS NEEDED FOR 3 OR MORE SEIZURES IN 24 HOURS. DO NOT TAKE MORE THAN 2 TABLETS IN 24 HOURS. Strength: 1 mg 10 tablet 5   Na Sulfate-K Sulfate-Mg Sulfate concentrate (SUPREP) 17.5-3.13-1.6 GM/177ML SOLN Take as directed 354 mL 0   potassium chloride  SA (KLOR-CON  M) 20 MEQ tablet Take 1 tablet (20 mEq total) by mouth every other day. 15 tablet 0   VIMPAT  200 MG TABS tablet Take 1 tablet (200 mg total) by mouth 2 (two) times daily. 200 tablet 3   warfarin (COUMADIN ) 6 MG tablet Take 1 tablet (6 mg total) by mouth daily at 4 PM. 30 tablet 1   No current  facility-administered medications for this visit.    Allergies  Allergen Reactions   Zonegran [Zonisamide] Other (See Comments)    Numbness and tingling over whole body   Chocolate Other (See Comments)    Triggers seizures   Codeine Nausea And Vomiting   Keppra  [Levetiracetam ] Other (See Comments)    Causes seizures   Lyrica  [Pregabalin ] Nausea And Vomiting   Olive Oil Rash     REVIEW OF SYSTEMS:  [X]  denotes positive finding, [ ]  denotes negative finding Cardiac  Comments:  Chest pain or chest pressure:    Shortness of breath upon exertion:    Short of breath when lying flat:    Irregular heart rhythm:        Vascular    Pain in calf, thigh, or hip brought on by ambulation:    Pain in feet at night that wakes you up from your sleep:     Blood clot in your veins:    Leg swelling:         Pulmonary    Oxygen  at home:    Productive cough:     Wheezing:         Neurologic    Sudden weakness in arms or legs:     Sudden numbness in arms or legs:     Sudden onset of difficulty speaking or slurred speech:    Temporary loss of vision in one eye:     Problems with dizziness:         Gastrointestinal    Blood in stool:     Vomited blood:         Genitourinary    Burning when urinating:  Blood in urine:        Psychiatric    Major depression:         Hematologic    Bleeding problems:    Problems with blood clotting too easily:        Skin    Rashes or ulcers:        Constitutional    Fever or chills:      PHYSICAL EXAMINATION:  There were no vitals filed for this visit.  General:  WDWN in NAD; vital signs documented above Gait: Not observed HENT: WNL, normocephalic Pulmonary: normal non-labored breathing , without wheezing Cardiac: regular HR Abdomen: soft, NT, no masses Skin: without rashes Vascular Exam/Pulses:  Right Left  Radial 2+ (normal) 2+ (normal)  Ulnar    Femoral    Popliteal    DP 2+ (normal) 2+ (normal)  PT     Extremities:  without ischemic changes, without Gangrene , without cellulitis; without open wounds;  Musculoskeletal: no muscle wasting or atrophy  Neurologic: A&O X 3;  No focal weakness or paresthesias are detected Psychiatric:  The pt has Normal affect.   Non-Invasive Vascular Imaging:   Bilateral iliac veins and inferior vena cava patent.  Some residual chronic thrombus.  No concerns at this time.    ASSESSMENT/PLAN: DONELDA MAILHOT is a 68 y.o. female presenting with history of IVC filter.  Unable to be anticoagulated due to intracranial aneurysm and falls.  Most recent imaging demonstrates patent IVC and iliac veins bilaterally.  I had a long conversation with [name] regarding the above, most notably whether she needs scheduled follow-up.  She is aware that the filter is permanent.  I think that at this point any residual thrombus is chronic.  After 3 years, there should be neointernalization of the IVC filter.  I asked her to discuss possible aspirin  at this time with her neurologist.  Moving forward, she can follow-up with me as needed.  She is aware that she should call should she notice any acute bilateral lower extremity edema.   [name] Part, MD [facility] [phone] Total time of patient care including pre-visit research, consultation, and documentation greater than 30 minutes

## 2023-12-30 NOTE — Telephone Encounter (Signed)
 Pt called an informed that the dentist will send a clearance if its needed, pt verbalized understanded, I asked when she would get lab work done she said when her daughter in law could bring her she would come,

## 2023-12-31 ENCOUNTER — Ambulatory Visit: Attending: Vascular Surgery | Admitting: Vascular Surgery

## 2023-12-31 ENCOUNTER — Encounter: Payer: Self-pay | Admitting: Vascular Surgery

## 2023-12-31 ENCOUNTER — Other Ambulatory Visit

## 2023-12-31 ENCOUNTER — Ambulatory Visit (HOSPITAL_COMMUNITY)
Admission: RE | Admit: 2023-12-31 | Discharge: 2023-12-31 | Disposition: A | Discharge status: Home / Self Care | Source: Ambulatory Visit | Attending: Vascular Surgery | Admitting: Vascular Surgery

## 2023-12-31 VITALS — BP 106/62 | HR 76 | Temp 97.1°F | Resp 18 | Ht 67.0 in | Wt 127.4 lb

## 2023-12-31 DIAGNOSIS — G40219 Localization-related (focal) (partial) symptomatic epilepsy and epileptic syndromes with complex partial seizures, intractable, without status epilepticus: Secondary | ICD-10-CM | POA: Diagnosis not present

## 2023-12-31 DIAGNOSIS — Z95828 Presence of other vascular implants and grafts: Secondary | ICD-10-CM

## 2023-12-31 DIAGNOSIS — Z86718 Personal history of other venous thrombosis and embolism: Secondary | ICD-10-CM

## 2023-12-31 DIAGNOSIS — Z79899 Other long term (current) drug therapy: Secondary | ICD-10-CM | POA: Diagnosis not present

## 2024-01-06 LAB — PHENYTOIN LEVEL, FREE AND TOTAL: PHENYTOIN, FREE: 0.6 mg/L — ABNORMAL LOW (ref 1.0–2.0)

## 2024-01-13 ENCOUNTER — Telehealth: Payer: Self-pay

## 2024-01-13 NOTE — Telephone Encounter (Signed)
  [name] 1955/10/19 [id number]  01/13/24   Dear Dr. Irvin Mantel:  We have scheduled the above named patient for a(n) endoscopy and colonoscopy procedure. Our records show that (s)he is on anticoagulation therapy.  Please advise as to whether the patient may come off their therapy of coumadin  5 days prior to their procedure which is scheduled for 02/25/24.  Please route your response to Nola Battiest RN or fax response to [phone].  Sincerely,    [facility]

## 2024-01-14 NOTE — Telephone Encounter (Signed)
 [name]

## 2024-01-15 NOTE — Telephone Encounter (Signed)
 Will you have your staff contact the patient regarding holding coumadin  5 days and lovenox  bridge?

## 2024-01-18 NOTE — Telephone Encounter (Signed)
 Dr. Bridgett Camps please see note below regarding lovenox  from Dr. Irvin Mantel.

## 2024-01-18 NOTE — Telephone Encounter (Signed)
 [name], who manages her warfarin -- warfarin clinic?  Likely pharmacy and we need their help in lovenox  bridging

## 2024-01-18 NOTE — Telephone Encounter (Signed)
 Thank you, I would like Coumadin  clinic to handle the warfarin because and Lovenox  bridge We have permission now from vascular surgery JMP

## 2024-01-18 NOTE — Telephone Encounter (Signed)
 Looks like Judene Noss prescribed the coumadin . I will fax request to her again. It was originally sent to her 12/16/23.

## 2024-01-21 NOTE — Telephone Encounter (Signed)
 Sent request back to Dr Haig Levan letting him know Dr. Irvin Mantel recommended lovenox  bridge. Asking if he is ok with no lovenox  bridge now.

## 2024-01-21 NOTE — Telephone Encounter (Signed)
 Dr. Bridgett Camps, fax received back from [facility] physicians. Per Dr Haig Levan pt ok to hold coumadin  for 5 days and no need for lovenox  bridge.

## 2024-01-21 NOTE — Telephone Encounter (Signed)
 I apologize for the back-and-forth but vascular surgery is recommending a Lovenox  bridge Does Dr. Haig Levan know that vascular surgery has recommended Lovenox  bridge? With opposing recommendations we need to arrive at the correct answer for the patient JMP

## 2024-01-27 NOTE — Telephone Encounter (Signed)
 Patient Daughter is returning a call back to [place]. Patient daughter is requesting a call back at [phone]. Please advise.

## 2024-01-27 NOTE — Telephone Encounter (Signed)
 Spoke with pts daughter and she stated that they had discussed holding the coumadin  and needing lovenox  bridge with Dr Dyane. Letter refaxed to Dr Dyane office for [employer].

## 2024-02-10 NOTE — Telephone Encounter (Signed)
 Refaxed note attn Candice for clarification regarding lovenox  again.

## 2024-02-11 NOTE — Telephone Encounter (Signed)
 Pt is to stop coumadin  5 days prior, start lovenox  3 days prior to procedures. She is to resume Lovenox  and coumadin  24 hours after procedures. She is to repeat INR in 3-4 days. All of this was relayed to the pt by Clemencia Hint office. She also prescribed the lovenox . Order sent to be scanned into epic.

## 2024-02-15 ENCOUNTER — Emergency Department (HOSPITAL_COMMUNITY)

## 2024-02-15 ENCOUNTER — Inpatient Hospital Stay (HOSPITAL_COMMUNITY)
Admission: EM | Admit: 2024-02-15 | Discharge: 2024-02-23 | DRG: 871 | Disposition: A | Discharge status: Home Health | Attending: Internal Medicine | Admitting: Internal Medicine

## 2024-02-15 ENCOUNTER — Other Ambulatory Visit: Payer: Self-pay

## 2024-02-15 DIAGNOSIS — Z8679 Personal history of other diseases of the circulatory system: Secondary | ICD-10-CM

## 2024-02-15 DIAGNOSIS — R519 Headache, unspecified: Secondary | ICD-10-CM | POA: Diagnosis present

## 2024-02-15 DIAGNOSIS — I499 Cardiac arrhythmia, unspecified: Secondary | ICD-10-CM | POA: Diagnosis not present

## 2024-02-15 DIAGNOSIS — Z8249 Family history of ischemic heart disease and other diseases of the circulatory system: Secondary | ICD-10-CM

## 2024-02-15 DIAGNOSIS — Z800 Family history of malignant neoplasm of digestive organs: Secondary | ICD-10-CM

## 2024-02-15 DIAGNOSIS — Z95828 Presence of other vascular implants and grafts: Secondary | ICD-10-CM

## 2024-02-15 DIAGNOSIS — Z86718 Personal history of other venous thrombosis and embolism: Secondary | ICD-10-CM

## 2024-02-15 DIAGNOSIS — G9341 Metabolic encephalopathy: Secondary | ICD-10-CM | POA: Diagnosis present

## 2024-02-15 DIAGNOSIS — Z87891 Personal history of nicotine dependence: Secondary | ICD-10-CM

## 2024-02-15 DIAGNOSIS — E876 Hypokalemia: Secondary | ICD-10-CM | POA: Diagnosis present

## 2024-02-15 DIAGNOSIS — Z8661 Personal history of infections of the central nervous system: Secondary | ICD-10-CM

## 2024-02-15 DIAGNOSIS — I480 Paroxysmal atrial fibrillation: Secondary | ICD-10-CM | POA: Diagnosis not present

## 2024-02-15 DIAGNOSIS — N390 Urinary tract infection, site not specified: Secondary | ICD-10-CM | POA: Diagnosis not present

## 2024-02-15 DIAGNOSIS — J4489 Other specified chronic obstructive pulmonary disease: Secondary | ICD-10-CM | POA: Diagnosis present

## 2024-02-15 DIAGNOSIS — Z8711 Personal history of peptic ulcer disease: Secondary | ICD-10-CM

## 2024-02-15 DIAGNOSIS — R791 Abnormal coagulation profile: Secondary | ICD-10-CM | POA: Diagnosis present

## 2024-02-15 DIAGNOSIS — R4182 Altered mental status, unspecified: Secondary | ICD-10-CM | POA: Diagnosis not present

## 2024-02-15 DIAGNOSIS — G9340 Encephalopathy, unspecified: Secondary | ICD-10-CM | POA: Diagnosis not present

## 2024-02-15 DIAGNOSIS — G9389 Other specified disorders of brain: Secondary | ICD-10-CM | POA: Diagnosis present

## 2024-02-15 DIAGNOSIS — Z885 Allergy status to narcotic agent status: Secondary | ICD-10-CM

## 2024-02-15 DIAGNOSIS — A419 Sepsis, unspecified organism: Secondary | ICD-10-CM | POA: Diagnosis not present

## 2024-02-15 DIAGNOSIS — K838 Other specified diseases of biliary tract: Secondary | ICD-10-CM | POA: Diagnosis present

## 2024-02-15 DIAGNOSIS — I951 Orthostatic hypotension: Secondary | ICD-10-CM | POA: Diagnosis not present

## 2024-02-15 DIAGNOSIS — Z79811 Long term (current) use of aromatase inhibitors: Secondary | ICD-10-CM

## 2024-02-15 DIAGNOSIS — Z802 Family history of malignant neoplasm of other respiratory and intrathoracic organs: Secondary | ICD-10-CM

## 2024-02-15 DIAGNOSIS — S42021A Displaced fracture of shaft of right clavicle, initial encounter for closed fracture: Secondary | ICD-10-CM | POA: Diagnosis not present

## 2024-02-15 DIAGNOSIS — E860 Dehydration: Secondary | ICD-10-CM | POA: Diagnosis present

## 2024-02-15 DIAGNOSIS — Z789 Other specified health status: Secondary | ICD-10-CM

## 2024-02-15 DIAGNOSIS — Z923 Personal history of irradiation: Secondary | ICD-10-CM

## 2024-02-15 DIAGNOSIS — Z853 Personal history of malignant neoplasm of breast: Secondary | ICD-10-CM

## 2024-02-15 DIAGNOSIS — R3129 Other microscopic hematuria: Secondary | ICD-10-CM | POA: Diagnosis present

## 2024-02-15 DIAGNOSIS — G8929 Other chronic pain: Secondary | ICD-10-CM | POA: Diagnosis present

## 2024-02-15 DIAGNOSIS — G40909 Epilepsy, unspecified, not intractable, without status epilepticus: Secondary | ICD-10-CM | POA: Diagnosis present

## 2024-02-15 DIAGNOSIS — Z87442 Personal history of urinary calculi: Secondary | ICD-10-CM

## 2024-02-15 DIAGNOSIS — Z7901 Long term (current) use of anticoagulants: Secondary | ICD-10-CM

## 2024-02-15 DIAGNOSIS — C50912 Malignant neoplasm of unspecified site of left female breast: Secondary | ICD-10-CM | POA: Diagnosis present

## 2024-02-15 DIAGNOSIS — Z888 Allergy status to other drugs, medicaments and biological substances status: Secondary | ICD-10-CM

## 2024-02-15 DIAGNOSIS — Z8049 Family history of malignant neoplasm of other genital organs: Secondary | ICD-10-CM

## 2024-02-15 DIAGNOSIS — Z79899 Other long term (current) drug therapy: Secondary | ICD-10-CM

## 2024-02-15 DIAGNOSIS — M549 Dorsalgia, unspecified: Secondary | ICD-10-CM | POA: Diagnosis present

## 2024-02-15 DIAGNOSIS — G0490 Encephalitis and encephalomyelitis, unspecified: Secondary | ICD-10-CM

## 2024-02-15 DIAGNOSIS — R410 Disorientation, unspecified: Secondary | ICD-10-CM | POA: Diagnosis not present

## 2024-02-15 DIAGNOSIS — Z87440 Personal history of urinary (tract) infections: Secondary | ICD-10-CM

## 2024-02-15 DIAGNOSIS — K219 Gastro-esophageal reflux disease without esophagitis: Secondary | ICD-10-CM | POA: Diagnosis present

## 2024-02-15 DIAGNOSIS — K5710 Diverticulosis of small intestine without perforation or abscess without bleeding: Secondary | ICD-10-CM | POA: Diagnosis present

## 2024-02-15 DIAGNOSIS — G4489 Other headache syndrome: Secondary | ICD-10-CM | POA: Diagnosis not present

## 2024-02-15 DIAGNOSIS — D696 Thrombocytopenia, unspecified: Secondary | ICD-10-CM | POA: Diagnosis present

## 2024-02-15 DIAGNOSIS — I69198 Other sequelae of nontraumatic intracerebral hemorrhage: Secondary | ICD-10-CM

## 2024-02-15 DIAGNOSIS — Z20822 Contact with and (suspected) exposure to covid-19: Secondary | ICD-10-CM | POA: Diagnosis present

## 2024-02-15 DIAGNOSIS — Z8616 Personal history of COVID-19: Secondary | ICD-10-CM

## 2024-02-15 DIAGNOSIS — I959 Hypotension, unspecified: Secondary | ICD-10-CM | POA: Diagnosis present

## 2024-02-15 LAB — I-STAT CG4 LACTIC ACID, ED: Lactic Acid, Venous: 2.3 mmol/L (ref 0.5–1.9)

## 2024-02-15 LAB — CBC
HCT: 40.1 % (ref 36.0–46.0)
Hemoglobin: 13.5 g/dL (ref 12.0–15.0)
MCH: 33.7 pg (ref 26.0–34.0)
MCHC: 33.7 g/dL (ref 30.0–36.0)
MCV: 100 fL (ref 80.0–100.0)
Platelets: 112 K/uL — ABNORMAL LOW (ref 150–400)
RBC: 4.01 MIL/uL (ref 3.87–5.11)
RDW: 11.9 % (ref 11.5–15.5)
WBC: 5.9 K/uL (ref 4.0–10.5)
nRBC: 0 % (ref 0.0–0.2)

## 2024-02-15 MED ORDER — ACETAMINOPHEN 500 MG PO TABS
1000.0000 mg | ORAL_TABLET | Freq: Once | ORAL | Status: AC
  Administered 2024-02-15: 1000 mg via ORAL
  Filled 2024-02-15: qty 2

## 2024-02-15 MED ORDER — SODIUM CHLORIDE 0.9 % IV BOLUS
1000.0000 mL | Freq: Once | INTRAVENOUS | Status: AC
  Administered 2024-02-15: 1000 mL via INTRAVENOUS

## 2024-02-15 NOTE — ED Provider Notes (Signed)
 [place] EMERGENCY DEPARTMENT AT [facility] Provider Note   CSN: [id number] Arrival date & time: 02/15/24  2237     Patient presents with: Altered Mental Status   [name] is a 68 y.o. female with history of chronic back pain, COPD, DVT, GERD, headaches, seizures, stroke, PEG dismal A-fib, gait disturbance.  Patient presents to ED for evaluation of altered mental status.  The patient reports that yesterday she began to have burning with urination and lower abdominal pain.  She was sent to the ED by her daughter out of concern for altered mental status.  On examination, the patient alert and oriented x 2.  She endorses abdominal pain.  Denies nausea or vomiting.  Endorses fevers at home.  States that she does have burning with urination.  Spoke with patient daughter who reports that the patient has been acting abnormally for the last 3 days.  The patient daughter reports that the patient had very dark and malodorous urine earlier today which is consistent with past episodes of UTI.  Patient daughter reports the patient typically gets altered and asked the way she is acting today when she gets UTIs.  The patient daughter denies that the patient has recently fallen or hit her head.  The patient daughter goes on to state that she believes that her mother had a seizure earlier today as she walked into her bedroom and she was naked in her bed and had urinated on herself.  Patient daughter reports that the patient typically has breakthrough seizures when she is getting sick with UTIs.  The patient daughter reports that the patient is compliant on her Dilantin  for her seizures.  Patient daughter also reports patient is compliant on blood thinning medication which she takes for history of DVT.    Altered Mental Status      Prior to Admission medications   Medication Sig Start Date End Date Taking? Authorizing Provider  acetaminophen  (TYLENOL ) 325 MG tablet Take 2 tablets (650 mg  total) by mouth every 4 (four) hours as needed for mild pain (pain score 1-3). 06/23/23   Hongalgi, Anand D, MD  anastrozole  (ARIMIDEX ) 1 MG tablet Take 1 tablet (1 mg total) by mouth daily. 10/20/23   Gudena, Vinay, MD  cyanocobalamin  (VITAMIN B12) 1000 MCG tablet Take 2 tablets (2,000 mcg total) by mouth daily. Patient not taking: Reported on 12/14/2023 02/28/23   Setzer, Sandra J, PA-C  DILANTIN  100 MG ER capsule Take 1 capsule twice a day (take with 30mg  capsule for total of 130mg  twice a day) 12/14/23   Georjean Darice HERO, MD  DILANTIN  30 MG ER capsule Take 1 capsule twice a day (Take with 100mg  capsule for total of 130mg  twice a day) 12/14/23   Georjean Darice HERO, MD  folic acid  (FOLVITE ) 1 MG tablet Take 1 tablet (1 mg total) by mouth daily. 10/27/23   Georjean Darice HERO, MD  LORazepam  (ATIVAN ) 1 MG tablet TAKE 1 TABLET BY MOUTH AS DIRECTED AS NEEDED FOR 3 OR MORE SEIZURES IN 24 HOURS. DO NOT TAKE MORE THAN 2 TABLETS IN 24 HOURS. Strength: 1 mg 10/27/23   Georjean Darice HERO, MD  Na Sulfate-K Sulfate-Mg Sulfate concentrate (SUPREP) 17.5-3.13-1.6 GM/177ML SOLN Take as directed 12/16/23   Pyrtle, Gordy HERO, MD  potassium chloride  SA (KLOR-CON  M) 20 MEQ tablet Take 1 tablet (20 mEq total) by mouth every other day. 02/27/23   Setzer, Nena PARAS, PA-C  VIMPAT  200 MG TABS tablet Take 1 tablet (200 mg  total) by mouth 2 (two) times daily. 12/14/23   Georjean Darice HERO, MD  warfarin (COUMADIN ) 6 MG tablet Take 1 tablet (6 mg total) by mouth daily at 4 PM. 04/07/23   Loistine Sober, NP    Allergies: Zonegran [zonisamide], Chocolate, Codeine, Keppra  [levetiracetam ], Lyrica  [pregabalin ], and Olive oil    Review of Systems  Unable to perform ROS: Mental status change (Level 5 caveat)  All other systems reviewed and are negative.   Updated Vital Signs BP 98/61   Pulse 85   Temp 98 F (36.7 C) (Oral)   Resp (!) 25   Ht 5' 7 (1.702 m)   Wt 59 kg   SpO2 100%   BMI 20.36 kg/m   Physical Exam Vitals and nursing note  reviewed.  Constitutional:      General: She is not in acute distress.    Appearance: She is well-developed.  HENT:     Head: Normocephalic and atraumatic.  Eyes:     Conjunctiva/sclera: Conjunctivae normal.  Cardiovascular:     Rate and Rhythm: Normal rate and regular rhythm.     Heart sounds: No murmur heard. Pulmonary:     Effort: Pulmonary effort is normal. No respiratory distress.     Breath sounds: Normal breath sounds.  Abdominal:     Palpations: Abdomen is soft.     Tenderness: There is no abdominal tenderness.  Musculoskeletal:        General: No swelling.     Cervical back: Neck supple.  Skin:    General: Skin is warm and dry.     Capillary Refill: Capillary refill takes less than 2 seconds.  Neurological:     Mental Status: She is alert. She is disoriented.  Psychiatric:        Mood and Affect: Mood normal.     (all labs ordered are listed, but only abnormal results are displayed) Labs Reviewed  CBC - Abnormal; Notable for the following components:      Result Value   Platelets 112 (*)    All other components within normal limits  URINALYSIS, ROUTINE W REFLEX MICROSCOPIC - Abnormal; Notable for the following components:   Color, Urine AMBER (*)    APPearance HAZY (*)    Hgb urine dipstick SMALL (*)    Bilirubin Urine SMALL (*)    Protein, ur 100 (*)    Bacteria, UA RARE (*)    All other components within normal limits  PHENYTOIN  LEVEL, TOTAL - Abnormal; Notable for the following components:   Phenytoin  Lvl 8.3 (*)    All other components within normal limits  COMPREHENSIVE METABOLIC PANEL WITH GFR - Abnormal; Notable for the following components:   Sodium 134 (*)    Potassium 3.1 (*)    CO2 20 (*)    Glucose, Bld 144 (*)    Calcium 8.3 (*)    Total Protein 6.1 (*)    Albumin 2.7 (*)    All other components within normal limits  PROTIME-INR - Abnormal; Notable for the following components:   Prothrombin Time 51.3 (*)    INR 5.4 (*)    All other  components within normal limits  APTT - Abnormal; Notable for the following components:   aPTT 75 (*)    All other components within normal limits  I-STAT CG4 LACTIC ACID, ED - Abnormal; Notable for the following components:   Lactic Acid, Venous 2.3 (*)    All other components within normal limits  RESP PANEL BY RT-PCR (RSV,  FLU A&B, COVID)  RVPGX2  URINE CULTURE  CULTURE, BLOOD (ROUTINE X 2)  CULTURE, BLOOD (ROUTINE X 2)  MAGNESIUM   C-REACTIVE PROTEIN  PROCALCITONIN  LACTIC ACID, PLASMA  LACTIC ACID, PLASMA  I-STAT CG4 LACTIC ACID, ED    EKG: EKG Interpretation Date/Time:  Tuesday February 16 2024 04:05:15 EDT Ventricular Rate:  90 PR Interval:  148 QRS Duration:  90 QT Interval:  332 QTC Calculation: 407 R Axis:   87  Text Interpretation: Sinus rhythm Probable anteroseptal infarct, recent When compared with ECG of EARLIER SAME DATE Sinus rhythm has replaced Atrial fibrillation Confirmed by Raford Lenis ([id number]) on 02/16/2024 5:02:37 AM  Radiology: CT ABDOMEN PELVIS W CONTRAST Result Date: 02/16/2024 CLINICAL DATA:  Acute nonlocalized abdominal pain, sepsis EXAM: CT ABDOMEN AND PELVIS WITH CONTRAST TECHNIQUE: Multidetector CT imaging of the abdomen and pelvis was performed using the standard protocol following bolus administration of intravenous contrast. RADIATION DOSE REDUCTION: This exam was performed according to the departmental dose-optimization program which includes automated exposure control, adjustment of the mA and/or kV according to patient size and/or use of iterative reconstruction technique. CONTRAST:  75mL OMNIPAQUE  IOHEXOL  350 MG/ML SOLN COMPARISON:  None Available. FINDINGS: Lower chest: No acute abnormality. Hepatobiliary: Riedel's lobe anatomy, an anatomic variant. Two small foci of portal-hepatic venous vascular shunting seen within the subcapsular, inferior right hepatic lobe. No enhancing intrahepatic mass. The gallbladder is unremarkable. There is mild  extrahepatic biliary ductal dilation with bile duct measuring up to 10 mm in diameter and abruptly terminating at the ampulla in the region of the duodenal diverticulum. No intrahepatic biliary ductal dilation. Pancreas: Large duodenal diverticulum within the head of the pancreas. The pancreas is otherwise unremarkable. Spleen: Unremarkable Adrenals/Urinary Tract: The adrenal glands are unremarkable. The kidneys are normal. The bladder is unremarkable. Stomach/Bowel: Stomach is within normal limits. Appendix appears normal. No evidence of bowel wall thickening, distention, or inflammatory changes. Vascular/Lymphatic: Extensive aortoiliac atherosclerotic calcification. No aortic aneurysm. Inferior vena cava filter is seen within the infrarenal cava. There is dilation of the left ovarian vein with numerous left adnexal varices identified suggesting changes of ovarian vein reflux and pelvic venous insufficiency. Pudendal varices are seen bilaterally suggesting superficial venous insufficiency involving the saphenofemoral junctions bilaterally. No pathologic adenopathy within the abdomen and pelvis. Reproductive: Uterus and bilateral adnexa are unremarkable. Other: Tiny fat containing right inguinal hernia Musculoskeletal: No acute bone abnormality. No lytic or blastic bone lesion. Osseous structures are age appropriate. IMPRESSION: 1. No acute intra-abdominal pathology identified. 2. Mild extrahepatic biliary ductal dilation with bile duct measuring up to 10 mm in diameter and abruptly terminating at the ampulla in the region of a large duodenal diverticulum. This is nonspecific though mass effect by the duodenal diverticulum or an underlying stricture could result in this appearance. Correlation with liver function tests is recommended. If abnormal, ERCP or MRCP examination may be helpful for further evaluation. 3. Dilated left ovarian vein with numerous left adnexal varices suggesting changes of pelvic venous  insufficiency. Additionally, bilateral superficial venous varices are seen involving the saphenofemoral junctions bilaterally suggest bilateral lower extremity superficial venous insufficiency. Electronically Signed   By: Dorethia Molt M.D.   On: 02/16/2024 03:10   CT Head Wo Contrast Result Date: 02/16/2024 CLINICAL DATA:  Mental status change, unknown cause EXAM: CT HEAD WITHOUT CONTRAST TECHNIQUE: Contiguous axial images were obtained from the base of the skull through the vertex without intravenous contrast. RADIATION DOSE REDUCTION: This exam was performed according to the departmental dose-optimization program which  includes automated exposure control, adjustment of the mA and/or kV according to patient size and/or use of iterative reconstruction technique. COMPARISON:  CT head 06/15/2023 FINDINGS: Brain: No intracranial hemorrhage, mass effect, or evidence of acute infarct. No hydrocephalus. No extra-axial fluid collection. 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 Communication with the right lateral ventricle is unchanged. Stable ventricular size. Vascular: No hyperdense vessel. Intracranial arterial calcification. Skull: No fracture or focal lesion. Sinuses/Orbits: No acute finding. Other: None. IMPRESSION: 1. No acute intracranial abnormality. 2. 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 Electronically Signed   By: Norman Gatlin M.D.   On: 02/16/2024 00:12   DG Chest Portable 1 View Result Date: 02/15/2024 CLINICAL DATA:  Sepsis, altered mental status EXAM: PORTABLE CHEST 1 VIEW COMPARISON:  03/25/2023 FINDINGS: Lungs are well expanded, symmetric, and clear. No pneumothorax or pleural effusion. Cardiac size within normal limits. Pulmonary vascularity is normal. Osseous structures are age-appropriate. Healed right mid diaphyseal clavicle fracture. No acute bone abnormality. IMPRESSION: No active  disease. Electronically Signed   By: Dorethia Molt M.D.   On: 02/15/2024 23:32     Procedures   Medications Ordered in the ED  sodium chloride  flush (NS) 0.9 % injection 10-40 mL (has no administration in time range)  amiodarone  (NEXTERONE  PREMIX) 360-4.14 MG/200ML-% (1.8 mg/mL) IV infusion (0 mg/hr Intravenous Paused 02/16/24 0501)  amiodarone  (NEXTERONE  PREMIX) 360-4.14 MG/200ML-% (1.8 mg/mL) IV infusion (has no administration in time range)  cefTRIAXone  (ROCEPHIN ) 2 g in sodium chloride  0.9 % 100 mL IVPB (2 g Intravenous New Bag/Given 02/16/24 0551)  LORazepam  (ATIVAN ) injection 2 mg (has no administration in time range)  cefTRIAXone  (ROCEPHIN ) 2 g in sodium chloride  0.9 % 100 mL IVPB (has no administration in time range)  lacosamide  (VIMPAT ) tablet 200 mg (has no administration in time range)  0.9 %  sodium chloride  infusion ( Intravenous New Bag/Given 02/16/24 0608)  phenytoin  (DILANTIN ) ER capsule 130 mg (has no administration in time range)  potassium chloride  (KLOR-CON ) packet 60 mEq (has no administration in time range)  acetaminophen  (TYLENOL ) tablet 1,000 mg (1,000 mg Oral Given 02/15/24 2329)  sodium chloride  0.9 % bolus 1,000 mL (0 mLs Intravenous Stopped 02/16/24 0144)  iohexol  (OMNIPAQUE ) 350 MG/ML injection 75 mL (75 mLs Intravenous Contrast Given 02/16/24 0245)  sodium chloride  0.9 % bolus 1,000 mL (0 mLs Intravenous Stopped 02/16/24 0421)    Medical Decision Making Amount and/or Complexity of Data Reviewed Labs: ordered. Radiology: ordered.  Risk OTC drugs.   68 year old female presents for evaluation.  Please see HPI for further details.  On examination the patient arrives febrile with an oral temp of 103 Fahrenheit.  She is tachycardic in a normal sinus rhythm.  Her lung sounds are clear bilaterally, she has nonhypoxic on room air.  Her abdomen is soft and compressible.  Her neurological examination reveals that she is disoriented however she follows commands  appropriately, she tracks cross midline, CN III through XII are intact, she can perform heel-to-shin.  Patient daughter reports that patient presentation very consistent with UTI.  Patient daughter reports that patient had breakthrough seizure today which again is consistent with past episodes of UTI.  The patient daughter reports patient is compliant on her Dilantin .  Labs were collected to include CBC, CMP, urinalysis, blood cultures, lactic acid, viral panel, phenytoin  level, chest x-ray, CT head, EKG.  Patient was given 1 L of fluid, Tylenol  for fever.  The patient CBC reveals no  leukocytosis or anemia.  Metabolic panel reveals sodium 865, potassium 3.1, repleted with 60 mEq oral potassium.  Patient phenytoin  level 8.3.  Urinalysis shows small hemoglobin, small bilirubin, rare bacteria but no nitrites or leukocytes.  Will culture patient urine.  Patient reports dysuria today so we will treat patient symptomatically for UTI. Given 2 g of Rocephin .  During the course patient workup, she did develop a course of A-fib with RVR.  She is anticoagulated.  She was given an additional liter of fluid however tachycardia remained.  Patient was loaded with amiodarone  at this time and became hypotensive.  Patient was set to be cardioverted however after pads were placed, the patient converted back to a normal sinus rhythm.  Amiodarone  was paused at this time.  PT/INR was obtained and shows prothrombin time 51.3, INR 5.4.  At this time feel patient requires admission to the hospital for encephalopathy secondary to most likely UTI.  The patient was discussed with Dr. Debby of the [facility]  hospitalist service who has agreed to admit the patient.  The patient is stable on admission.    Final diagnoses:  Encephalopathy, unspecified type  Paroxysmal A-fib [facility])    ED Discharge Orders     None          [name] 02/16/24 [id number]    [name] Lonni PARAS, MD 02/16/24 [id number]

## 2024-02-15 NOTE — ED Provider Notes (Incomplete)
  EMERGENCY DEPARTMENT AT [facility] Provider Note   CSN: [id number] Arrival date & time: 02/15/24  2237     Patient presents with: Altered Mental Status   [name] is a 68 y.o. female with history of chronic back pain, COPD, DVT, GERD, headaches, seizures, stroke, PEG dismal A-fib, gait disturbance.  Patient presents to ED for evaluation of altered mental status.  The patient reports that yesterday she began to have burning with urination and lower abdominal pain.  She was sent to the ED by her daughter out of concern for altered mental status.  On examination, the patient alert and oriented x 2.  She endorses abdominal pain.  Denies nausea or vomiting.  Endorses fevers at home.  States that she does have burning with urination.  Spoke with patient daughter who reports that the patient has been acting abnormally for the last 3 days.  The patient daughter reports that the patient had very dark and malodorous urine earlier today which is consistent with past episodes of UTI.  Patient daughter reports the patient typically gets altered and asked the way she is acting today when she gets UTIs.  The patient daughter denies that the patient has recently fallen or hit her head.  The patient daughter goes on to state that she believes that her mother had a seizure earlier today as she walked into her bedroom and she was naked in her bed and had urinated on herself.  Patient daughter reports that the patient typically has breakthrough seizures when she is getting sick with UTIs.  The patient daughter reports that the patient is compliant on her Dilantin  for her seizures.  Patient daughter also reports patient is compliant on blood thinning medication which she takes for history of DVT.    Altered Mental Status      Prior to Admission medications   Medication Sig Start Date End Date Taking? Authorizing Provider  acetaminophen  (TYLENOL ) 325 MG tablet Take 2 tablets (650 mg  total) by mouth every 4 (four) hours as needed for mild pain (pain score 1-3). 06/23/23   Hongalgi, Anand D, MD  anastrozole  (ARIMIDEX ) 1 MG tablet Take 1 tablet (1 mg total) by mouth daily. 10/20/23   Gudena, Vinay, MD  cyanocobalamin  (VITAMIN B12) 1000 MCG tablet Take 2 tablets (2,000 mcg total) by mouth daily. Patient not taking: Reported on 12/14/2023 02/28/23   Setzer, Sandra J, PA-C  DILANTIN  100 MG ER capsule Take 1 capsule twice a day (take with 30mg  capsule for total of 130mg  twice a day) 12/14/23   Georjean Darice HERO, MD  DILANTIN  30 MG ER capsule Take 1 capsule twice a day (Take with 100mg  capsule for total of 130mg  twice a day) 12/14/23   Georjean Darice HERO, MD  folic acid  (FOLVITE ) 1 MG tablet Take 1 tablet (1 mg total) by mouth daily. 10/27/23   Georjean Darice HERO, MD  LORazepam  (ATIVAN ) 1 MG tablet TAKE 1 TABLET BY MOUTH AS DIRECTED AS NEEDED FOR 3 OR MORE SEIZURES IN 24 HOURS. DO NOT TAKE MORE THAN 2 TABLETS IN 24 HOURS. Strength: 1 mg 10/27/23   Georjean Darice HERO, MD  Na Sulfate-K Sulfate-Mg Sulfate concentrate (SUPREP) 17.5-3.13-1.6 GM/177ML SOLN Take as directed 12/16/23   Pyrtle, Gordy HERO, MD  potassium chloride  SA (KLOR-CON  M) 20 MEQ tablet Take 1 tablet (20 mEq total) by mouth every other day. 02/27/23   Setzer, Nena PARAS, PA-C  VIMPAT  200 MG TABS tablet Take 1 tablet (200 mg  total) by mouth 2 (two) times daily. 12/14/23   Georjean Darice HERO, MD  warfarin (COUMADIN ) 6 MG tablet Take 1 tablet (6 mg total) by mouth daily at 4 PM. 04/07/23   Loistine Sober, NP    Allergies: Zonegran [zonisamide], Chocolate, Codeine, Keppra  [levetiracetam ], Lyrica  [pregabalin ], and Olive oil    Review of Systems  Updated Vital Signs BP (!) 115/56   Pulse (!) 104   Temp (!) 103 F (39.4 C) (Oral)   Resp 16   Ht 5' 7 (1.702 m)   Wt 59 kg   SpO2 92%   BMI 20.36 kg/m   Physical Exam  (all labs ordered are listed, but only abnormal results are displayed) Labs Reviewed  URINE CULTURE  CULTURE, BLOOD (ROUTINE X  2)  CULTURE, BLOOD (ROUTINE X 2)  CBC  COMPREHENSIVE METABOLIC PANEL WITH GFR  URINALYSIS, ROUTINE W REFLEX MICROSCOPIC  I-STAT CG4 LACTIC ACID, ED    EKG: EKG Interpretation Date/Time:  Monday February 15 2024 22:51:19 EDT Ventricular Rate:  103 PR Interval:  136 QRS Duration:  81 QT Interval:  294 QTC Calculation: 385 R Axis:   86  Text Interpretation: Sinus tachycardia Borderline right axis deviation No significant change since prior 12/24 Confirmed by Towana Sharper [phone]) on 02/15/2024 10:52:55 PM  Radiology: No results found.  {Document cardiac monitor, telemetry assessment procedure when appropriate:32947} Procedures   Medications Ordered in the ED  acetaminophen  (TYLENOL ) tablet 1,000 mg (has no administration in time range)  sodium chloride  0.9 % bolus 1,000 mL (has no administration in time range)      {Click here for ABCD2, HEART and other calculators REFRESH Note before signing:1}                              Medical Decision Making Amount and/or Complexity of Data Reviewed Labs: ordered. Radiology: ordered.  Risk OTC drugs.   ***  {Document critical care time when appropriate  Document review of labs and clinical decision tools ie CHADS2VASC2, etc  Document your independent review of radiology images and any outside records  Document your discussion with family members, caretakers and with consultants  Document social determinants of health affecting pt's care  Document your decision making why or why not admission, treatments were needed:32947:::1}   Final diagnoses:  None    ED Discharge Orders     None

## 2024-02-15 NOTE — ED Triage Notes (Signed)
 Pt BIB [facility] from home. Pts family reports pt has dark/foul odor urine, AMS, headache and fevers for the last 24 hrs. Tylenol  has not been effective. Pt does have hx of UTI presenting with these symptoms. Pt a/o x3.

## 2024-02-16 ENCOUNTER — Emergency Department (HOSPITAL_COMMUNITY)

## 2024-02-16 ENCOUNTER — Inpatient Hospital Stay (HOSPITAL_COMMUNITY)

## 2024-02-16 DIAGNOSIS — R569 Unspecified convulsions: Secondary | ICD-10-CM | POA: Diagnosis not present

## 2024-02-16 DIAGNOSIS — C50912 Malignant neoplasm of unspecified site of left female breast: Secondary | ICD-10-CM | POA: Diagnosis not present

## 2024-02-16 DIAGNOSIS — K219 Gastro-esophageal reflux disease without esophagitis: Secondary | ICD-10-CM | POA: Diagnosis not present

## 2024-02-16 DIAGNOSIS — I959 Hypotension, unspecified: Secondary | ICD-10-CM | POA: Diagnosis present

## 2024-02-16 DIAGNOSIS — R4182 Altered mental status, unspecified: Secondary | ICD-10-CM | POA: Diagnosis not present

## 2024-02-16 DIAGNOSIS — Z86718 Personal history of other venous thrombosis and embolism: Secondary | ICD-10-CM | POA: Diagnosis not present

## 2024-02-16 DIAGNOSIS — K5710 Diverticulosis of small intestine without perforation or abscess without bleeding: Secondary | ICD-10-CM | POA: Diagnosis not present

## 2024-02-16 DIAGNOSIS — R109 Unspecified abdominal pain: Secondary | ICD-10-CM | POA: Diagnosis not present

## 2024-02-16 DIAGNOSIS — G9341 Metabolic encephalopathy: Secondary | ICD-10-CM | POA: Diagnosis not present

## 2024-02-16 DIAGNOSIS — Z8679 Personal history of other diseases of the circulatory system: Secondary | ICD-10-CM | POA: Diagnosis not present

## 2024-02-16 DIAGNOSIS — E860 Dehydration: Secondary | ICD-10-CM | POA: Diagnosis not present

## 2024-02-16 DIAGNOSIS — G8929 Other chronic pain: Secondary | ICD-10-CM | POA: Diagnosis not present

## 2024-02-16 DIAGNOSIS — I69198 Other sequelae of nontraumatic intracerebral hemorrhage: Secondary | ICD-10-CM | POA: Diagnosis not present

## 2024-02-16 DIAGNOSIS — J4489 Other specified chronic obstructive pulmonary disease: Secondary | ICD-10-CM | POA: Diagnosis not present

## 2024-02-16 DIAGNOSIS — G40909 Epilepsy, unspecified, not intractable, without status epilepticus: Secondary | ICD-10-CM | POA: Diagnosis not present

## 2024-02-16 DIAGNOSIS — D696 Thrombocytopenia, unspecified: Secondary | ICD-10-CM

## 2024-02-16 DIAGNOSIS — R509 Fever, unspecified: Secondary | ICD-10-CM | POA: Diagnosis not present

## 2024-02-16 DIAGNOSIS — G9389 Other specified disorders of brain: Secondary | ICD-10-CM | POA: Diagnosis not present

## 2024-02-16 DIAGNOSIS — R1031 Right lower quadrant pain: Secondary | ICD-10-CM | POA: Diagnosis not present

## 2024-02-16 DIAGNOSIS — R791 Abnormal coagulation profile: Secondary | ICD-10-CM | POA: Diagnosis present

## 2024-02-16 DIAGNOSIS — K838 Other specified diseases of biliary tract: Secondary | ICD-10-CM | POA: Diagnosis not present

## 2024-02-16 DIAGNOSIS — A419 Sepsis, unspecified organism: Secondary | ICD-10-CM | POA: Diagnosis not present

## 2024-02-16 DIAGNOSIS — Z8616 Personal history of COVID-19: Secondary | ICD-10-CM | POA: Diagnosis not present

## 2024-02-16 DIAGNOSIS — E876 Hypokalemia: Secondary | ICD-10-CM | POA: Diagnosis not present

## 2024-02-16 DIAGNOSIS — R6520 Severe sepsis without septic shock: Secondary | ICD-10-CM | POA: Diagnosis not present

## 2024-02-16 DIAGNOSIS — I672 Cerebral atherosclerosis: Secondary | ICD-10-CM | POA: Diagnosis not present

## 2024-02-16 DIAGNOSIS — I480 Paroxysmal atrial fibrillation: Secondary | ICD-10-CM

## 2024-02-16 DIAGNOSIS — Z20822 Contact with and (suspected) exposure to covid-19: Secondary | ICD-10-CM | POA: Diagnosis not present

## 2024-02-16 DIAGNOSIS — R410 Disorientation, unspecified: Secondary | ICD-10-CM | POA: Diagnosis not present

## 2024-02-16 DIAGNOSIS — Z95828 Presence of other vascular implants and grafts: Secondary | ICD-10-CM | POA: Diagnosis not present

## 2024-02-16 DIAGNOSIS — Z7901 Long term (current) use of anticoagulants: Secondary | ICD-10-CM | POA: Diagnosis not present

## 2024-02-16 DIAGNOSIS — G039 Meningitis, unspecified: Secondary | ICD-10-CM | POA: Diagnosis not present

## 2024-02-16 DIAGNOSIS — R3129 Other microscopic hematuria: Secondary | ICD-10-CM | POA: Diagnosis not present

## 2024-02-16 DIAGNOSIS — M549 Dorsalgia, unspecified: Secondary | ICD-10-CM | POA: Diagnosis not present

## 2024-02-16 DIAGNOSIS — G9340 Encephalopathy, unspecified: Secondary | ICD-10-CM | POA: Diagnosis not present

## 2024-02-16 DIAGNOSIS — I951 Orthostatic hypotension: Secondary | ICD-10-CM | POA: Diagnosis not present

## 2024-02-16 DIAGNOSIS — R519 Headache, unspecified: Secondary | ICD-10-CM | POA: Diagnosis not present

## 2024-02-16 DIAGNOSIS — K4090 Unilateral inguinal hernia, without obstruction or gangrene, not specified as recurrent: Secondary | ICD-10-CM | POA: Diagnosis not present

## 2024-02-16 DIAGNOSIS — Z853 Personal history of malignant neoplasm of breast: Secondary | ICD-10-CM | POA: Diagnosis not present

## 2024-02-16 LAB — RESPIRATORY PANEL BY PCR

## 2024-02-16 LAB — URINALYSIS, ROUTINE W REFLEX MICROSCOPIC
Glucose, UA: NEGATIVE mg/dL
Ketones, ur: NEGATIVE mg/dL
Leukocytes,Ua: NEGATIVE
Nitrite: NEGATIVE
Protein, ur: 100 mg/dL — AB
Specific Gravity, Urine: 1.021 (ref 1.005–1.030)
pH: 5 (ref 5.0–8.0)

## 2024-02-16 LAB — COMPREHENSIVE METABOLIC PANEL WITH GFR
ALT: 27 U/L (ref 0–44)
AST: 41 U/L (ref 15–41)
Albumin: 2.7 g/dL — ABNORMAL LOW (ref 3.5–5.0)
Alkaline Phosphatase: 68 U/L (ref 38–126)
Anion gap: 12 (ref 5–15)
BUN: 15 mg/dL (ref 8–23)
CO2: 20 mmol/L — ABNORMAL LOW (ref 22–32)
Calcium: 8.3 mg/dL — ABNORMAL LOW (ref 8.9–10.3)
Chloride: 102 mmol/L (ref 98–111)
Creatinine, Ser: 0.81 mg/dL (ref 0.44–1.00)
GFR, Estimated: >60 mL/min (ref >60)
Glucose, Bld: 144 mg/dL — ABNORMAL HIGH (ref 70–99)
Potassium: 3.1 mmol/L — ABNORMAL LOW (ref 3.5–5.1)
Sodium: 134 mmol/L — ABNORMAL LOW (ref 135–145)
Total Bilirubin: 1.1 mg/dL (ref 0.0–1.2)
Total Protein: 6.1 g/dL — ABNORMAL LOW (ref 6.5–8.1)

## 2024-02-16 LAB — APTT: aPTT: 75 s — ABNORMAL HIGH (ref 24–36)

## 2024-02-16 LAB — HIV ANTIBODY (ROUTINE TESTING W REFLEX): HIV Screen 4th Generation wRfx: NONREACTIVE

## 2024-02-16 LAB — I-STAT CG4 LACTIC ACID, ED: Lactic Acid, Venous: 0.5 mmol/L (ref 0.5–1.9)

## 2024-02-16 LAB — LACTIC ACID, PLASMA: Lactic Acid, Venous: 0.8 mmol/L (ref 0.5–1.9)

## 2024-02-16 LAB — RESP PANEL BY RT-PCR (RSV, FLU A&B, COVID)  RVPGX2
Influenza A by PCR: NEGATIVE
Influenza B by PCR: NEGATIVE
Resp Syncytial Virus by PCR: NEGATIVE
SARS Coronavirus 2 by RT PCR: NEGATIVE

## 2024-02-16 LAB — MAGNESIUM: Magnesium: 2.1 mg/dL (ref 1.7–2.4)

## 2024-02-16 LAB — PHENYTOIN LEVEL, TOTAL: Phenytoin Lvl: 8.3 ug/mL — ABNORMAL LOW (ref 10.0–20.0)

## 2024-02-16 LAB — PROTIME-INR
INR: 5.4 (ref 0.8–1.2)
Prothrombin Time: 51.3 s — ABNORMAL HIGH (ref 11.4–15.2)

## 2024-02-16 LAB — C-REACTIVE PROTEIN: CRP: 25.8 mg/dL — ABNORMAL HIGH (ref <1.0)

## 2024-02-16 LAB — PROCALCITONIN: Procalcitonin: 0.39 ng/mL

## 2024-02-16 MED ORDER — WARFARIN - PHARMACIST DOSING INPATIENT: Freq: Every day | Status: DC

## 2024-02-16 MED ORDER — LORAZEPAM 2 MG/ML IJ SOLN: 2.0000 mg | INTRAMUSCULAR | Status: DC | PRN

## 2024-02-16 MED ORDER — ONDANSETRON HCL 4 MG PO TABS
4.0000 mg | ORAL_TABLET | Freq: Four times a day (QID) | ORAL | Status: DC | PRN
Start: 2024-02-16 — End: 2024-02-23
  Administered 2024-02-16: 4 mg via ORAL
  Filled 2024-02-16: qty 1

## 2024-02-16 MED ORDER — FOLIC ACID 1 MG PO TABS
1.0000 mg | ORAL_TABLET | Freq: Every day | ORAL | Status: DC
  Administered 2024-02-16 – 2024-02-23 (×7): 1 mg via ORAL
  Filled 2024-02-16 (×7): qty 1

## 2024-02-16 MED ORDER — SODIUM CHLORIDE 0.9 % IV SOLN
2.0000 g | Freq: Every day | INTRAVENOUS | Status: DC
  Filled 2024-02-16: qty 20

## 2024-02-16 MED ORDER — SODIUM CHLORIDE 0.9 % IV SOLN: INTRAVENOUS | Status: AC

## 2024-02-16 MED ORDER — ANASTROZOLE 1 MG PO TABS
1.0000 mg | ORAL_TABLET | Freq: Every day | ORAL | Status: DC
  Administered 2024-02-17 – 2024-02-23 (×7): 1 mg via ORAL
  Filled 2024-02-16 (×7): qty 1

## 2024-02-16 MED ORDER — ONDANSETRON HCL 4 MG/2ML IJ SOLN: 4.0000 mg | Freq: Four times a day (QID) | INTRAMUSCULAR | Status: DC | PRN

## 2024-02-16 MED ORDER — AMIODARONE HCL IN DEXTROSE 360-4.14 MG/200ML-% IV SOLN: 30.0000 mg/h | INTRAVENOUS | Status: DC

## 2024-02-16 MED ORDER — SODIUM CHLORIDE 0.9 % IV SOLN
2.0000 g | Freq: Once | INTRAVENOUS | Status: AC
  Administered 2024-02-16: 2 g via INTRAVENOUS
  Filled 2024-02-16: qty 20

## 2024-02-16 MED ORDER — LACOSAMIDE 50 MG PO TABS
200.0000 mg | ORAL_TABLET | Freq: Two times a day (BID) | ORAL | Status: DC
  Administered 2024-02-16 – 2024-02-23 (×15): 200 mg via ORAL
  Filled 2024-02-16 (×15): qty 4

## 2024-02-16 MED ORDER — PHENYTOIN SODIUM EXTENDED 30 MG PO CAPS
130.0000 mg | ORAL_CAPSULE | Freq: Two times a day (BID) | ORAL | Status: DC
  Administered 2024-02-16 – 2024-02-23 (×14): 130 mg via ORAL
  Filled 2024-02-16 (×15): qty 1

## 2024-02-16 MED ORDER — ETOMIDATE 2 MG/ML IV SOLN: 0.1000 mg/kg | Freq: Once | INTRAVENOUS | Status: DC

## 2024-02-16 MED ORDER — ALBUTEROL SULFATE (2.5 MG/3ML) 0.083% IN NEBU: 2.5000 mg | INHALATION_SOLUTION | Freq: Four times a day (QID) | RESPIRATORY_TRACT | Status: DC | PRN

## 2024-02-16 MED ORDER — AMIODARONE HCL IN DEXTROSE 360-4.14 MG/200ML-% IV SOLN
60.0000 mg/h | INTRAVENOUS | Status: DC
  Administered 2024-02-16: 60 mg/h via INTRAVENOUS
  Filled 2024-02-16: qty 200

## 2024-02-16 MED ORDER — SODIUM CHLORIDE 0.9 % IV BOLUS
500.0000 mL | Freq: Once | INTRAVENOUS | Status: AC
  Administered 2024-02-16: 500 mL via INTRAVENOUS

## 2024-02-16 MED ORDER — IOHEXOL 350 MG/ML SOLN
75.0000 mL | Freq: Once | INTRAVENOUS | Status: AC | PRN
  Administered 2024-02-16: 75 mL via INTRAVENOUS

## 2024-02-16 MED ORDER — SODIUM CHLORIDE 0.9% FLUSH: 10.0000 mL | INTRAVENOUS | Status: DC | PRN

## 2024-02-16 MED ORDER — SODIUM CHLORIDE 0.9% FLUSH
3.0000 mL | Freq: Two times a day (BID) | INTRAVENOUS | Status: DC
  Administered 2024-02-16 – 2024-02-23 (×12): 3 mL via INTRAVENOUS

## 2024-02-16 MED ORDER — PHENYTOIN SODIUM EXTENDED 100 MG PO CAPS: 100.0000 mg | ORAL_CAPSULE | Freq: Two times a day (BID) | ORAL | Status: DC

## 2024-02-16 MED ORDER — ACETAMINOPHEN 650 MG RE SUPP: 650.0000 mg | Freq: Four times a day (QID) | RECTAL | Status: DC | PRN

## 2024-02-16 MED ORDER — ACETAMINOPHEN 325 MG PO TABS
650.0000 mg | ORAL_TABLET | Freq: Four times a day (QID) | ORAL | Status: DC | PRN
  Administered 2024-02-16 – 2024-02-21 (×11): 650 mg via ORAL
  Filled 2024-02-16 (×11): qty 2

## 2024-02-16 MED ORDER — SODIUM CHLORIDE 0.9 % IV BOLUS
1000.0000 mL | Freq: Once | INTRAVENOUS | Status: AC
  Administered 2024-02-16: 1000 mL via INTRAVENOUS

## 2024-02-16 MED ORDER — BUTALBITAL-APAP-CAFFEINE 50-325-40 MG PO TABS
1.0000 | ORAL_TABLET | Freq: Four times a day (QID) | ORAL | Status: DC | PRN
  Administered 2024-02-16 – 2024-02-17 (×2): 1 via ORAL
  Filled 2024-02-16 (×2): qty 1

## 2024-02-16 MED ORDER — PHENYTOIN SODIUM EXTENDED 30 MG PO CAPS: 30.0000 mg | ORAL_CAPSULE | Freq: Two times a day (BID) | ORAL | Status: DC

## 2024-02-16 MED ORDER — POTASSIUM CHLORIDE 20 MEQ PO PACK
60.0000 meq | PACK | Freq: Every day | ORAL | Status: DC
  Administered 2024-02-16 – 2024-02-17 (×2): 60 meq via ORAL
  Filled 2024-02-16 (×2): qty 3

## 2024-02-16 NOTE — Progress Notes (Signed)
 EEG complete - results pending

## 2024-02-16 NOTE — ED Notes (Signed)
 Patient transported to CT

## 2024-02-16 NOTE — ED Notes (Signed)
EDP notified of pt blood pressure.

## 2024-02-16 NOTE — ED Notes (Signed)
 CCMD contacted

## 2024-02-16 NOTE — ED Notes (Signed)
 Patient returned from CT

## 2024-02-16 NOTE — ED Notes (Addendum)
 Pt attempted to use bedpan to provide urine sample, unsuccessful. Pt difficult to redirect/difficulty understanding process of sample needed.

## 2024-02-16 NOTE — Progress Notes (Signed)
 [facility] night cross cover note:   Regarding this patient who was admitted earlier today with acute encephalopathy, urinary tract infection, I was notified by the patient's RN of the patient's most recent systolic blood pressures in the low 100s, with most recent temperature 101.5.  His most recent systolic blood pressures slightly lower than 90 systolic values in the 1 teens to 120s earlier this afternoon, but is improved relative to her presenting systolic blood pressures in the 80s mmHg. her current temperature 101.5 is prompting receipt of acetaminophen  at this time, and has trended down from her temperature max of 102.4 earlier today.  Additional vital signs at this time are notable for the following: Heart rates in the 90s, respiratory rate 18-20, and oxygen  saturation in the mid 90s on room air.  Urine culture has been collected and is currently pending, and the patient is receiving Rocephin .  Most recent lactic acid 0.8.  She is currently receiving normal saline at 125 cc/h.  Given this relative decline in interval blood pressure, will order a 500 cc NS bolus at this time.   Update: initial improvement in bp following 500 cc ns bolus, but BP starting to decrease again with most recent systolic blood pressures in the 80s.  I have now ordered another 500 cc NS bolus.    Eva Pore, DO Hospitalist

## 2024-02-16 NOTE — Procedures (Signed)
 Patient Name: [name]  MRN: [id number]  Epilepsy Attending: Arlin MALVA Krebs  Referring Physician/Provider: Debby Camila LABOR, MD  Date: 02/16/2024 Duration: 23.47 mins  Patient history: 68yo F with ams. EEG to evaluate for seizure.  Level of alertness: Awake  AEDs during EEG study: PHT, LCM  Technical aspects: This EEG study was done with scalp electrodes positioned according to the 10-20 International system of electrode placement. Electrical activity was reviewed with band pass filter of 1-70Hz , sensitivity of 7 uV/mm, display speed of 69mm/sec with a 60Hz  notched filter applied as appropriate. EEG data were recorded continuously and digitally stored.  Video monitoring was available and reviewed as appropriate.  Description: The posterior dominant rhythm consists of 8-9 Hz activity of moderate voltage (25-35 uV) seen predominantly in posterior head regions, symmetric and reactive to eye opening and eye closing. Sleep was characterized by sleep spindles (12-14hz ), maximal fronto-central region. EEG showed continuous polymorphic sharply contoured rhythmic 3 to 6 Hz theta-delta slowing in right hemisphere, maximal right posterior quadrant.  Intermittent generalized 3-5hz  theta- delta slowing was also noted abundant spike and waves were noted in right hemisphere, maximal right posterior quadrant, at times qasi- periodic at 1-2Hz .  Photic driving was not seen during photic stimulation.  Hyperventilation was not performed.      ABNORMALITY - Spike and waves, right hemisphere, maximal right posterior quadrant  - Continuous slow, right hemisphere, maximal right posterior quadrant -Intermittent slow, generalized   IMPRESSION: This study showed evidence of epileptogenicity and cortical dysfunction arising from right hemisphere, maximal right posterior quadrant. This EEG pattern is on the ictal-interictal continuum with potential for seizures.  Additionally there is mild to moderate diffuse  encephalopathy.  No definite seizures were noted.   Jamirah Zelaya O Willett Lefeber

## 2024-02-16 NOTE — ED Notes (Signed)
 Pt concerned about missing necklace when returning from CT; CT contacted @ 0327 and denies taking pt necklace off

## 2024-02-16 NOTE — ED Notes (Signed)
 Per paramedic, pt is difficult stick.  Pt has midline but pleb can only stick pt. Earlier this morning, Phlebotomist Kaitlin tried sticking the pt but unable to obtain blood.  KM

## 2024-02-16 NOTE — ED Notes (Signed)
 EEG at bedside.

## 2024-02-16 NOTE — H&P (Addendum)
 History and Physical    Patient: [name] FMW:[id number] DOB: [birth year] DOA: 02/15/2024 DOS: the patient was seen and examined on 02/16/2024 PCP: Dyane Anthony RAMAN, FNP  Patient coming from: Home  Chief Complaint:  Chief Complaint  Patient presents with   Altered Mental Status   HPI: [name] is a 68 y.o. female with medical history significant of paroxysmal atrial fibrillation on chronic anticoagulation, CVA, headaches, seizures, DVT, COPD, chronic back pain, gait disturbance, and GERD presents with altered mental status and possible seizure activity. She is accompanied by her son.  She was brought to the hospital due to altered mental status, characterized by disorganized speech and unusual behavior. Her family noted a significant change in her condition from yesterday to today. She has experienced similar episodes in the past, often associated with urinary tract infections (UTIs), although it is unclear if she had a UTI this time.  She has a history of seizures, which began after a brain hemorrhage and subsequent surgery 36 years ago. While it is not uncommon for her to have seizures, it is unclear if she experienced one yesterday. Approximately nine months ago, she had a similar episode of altered mental status and seizures, which was treated as a UTI despite unclear findings.  No discomfort with urination and no recent issues with urination, although she was noted to be dehydrated. She has a habit of drinking coffee and not enough water, which may contribute to dehydration.  She quit smoking seven years ago and has not experienced shortness of breath since then. She used to take methamphetamine but has not since quitting smoking.  In the emergency department patient was noted to be febrile up to 103 F with heart rates elevated up to 158 in A-fib with RVR, respirations elevated to 27, blood pressure 84/47 -119/48, and O2 saturations maintained on room air.  Labs significant for  WBC 5.8, platelets 112, lactic acid 2.3-> 0.5, sodium 134, potassium 3.1, INR 5.4, CRP 25.8, and phenytoin  level 8.3.  Chest x-ray showed no acute abnormality.  Influenza, COVID-19, and RSV screening were negative.  CT scan of the head did not reveal any acute abnormality and chronic encephalomalacia in the right posterior hemisphere.  Urinalysis did not show significant signs for infection.  CT scan of the abdomen pelvis have been obtained that did not reveal any acute intra-abdominal pathology but noted mild extrahepatic biliary duct dilatation with bile duct measuring up to 10 mm and appropriately terminating at the ampulla in the region of the large duodenal diverticulum.  Blood cultures were ordered.  Patient had been given Tylenol  1000 mg p.o., 2 L of normal saline IV fluids, and 2 g of Rocephin  IV.  Review of Systems: As mentioned in the history of present illness. All other systems reviewed and are negative. Past Medical History:  Diagnosis Date   Acute bronchitis 08/06/2022   Acute respiratory failure with hypoxia (HCC) 03/31/2015   Altered mental status    Asthma    Ataxia 08/26/2019   Atypical chest pain 02/17/2017   Cerebral hemorrhage (HCC) 1989   Chronic back pain    Closed fracture of distal end of left radius 07/17/2022   Closed fracture of fifth metatarsal bone 01/04/2019   Closed fracture of proximal phalanx of great toe 01/04/2019   Closed nondisplaced fracture of styloid process of left radius 08/06/2022   COPD (chronic obstructive pulmonary disease) (HCC)    no meds per daughter   COPD with exacerbation (HCC) 03/30/2015  Costochondritis 02/17/2017   COVID-19 virus infection 04/02/2020   DDD (degenerative disc disease) 06/12/2013   DVT (deep venous thrombosis) (HCC)    she's had several since 1990   GERD (gastroesophageal reflux disease)    no meds per daughter   Headache    usually around time when she's had a seizure   History of blood transfusion    when she  was a baby   History of kidney stones    History of stomach ulcers    Hypoxia 01/18/2015   Lactic acidosis 04/02/2020   Neuropathy    Seizures (HCC)    because of her brain surgery  last one 07/28/22   Sepsis secondary to UTI (HCC) 01/17/2015   Stroke [facility]) 1990's   family denies residual on 11/09/2014   Tunnel vision    since brain OR   Past Surgical History:  Procedure Laterality Date   BRAIN SURGERY  1989   craniotomy with hematoma evacuation   BREAST BIOPSY Left 12/22/2022   US  LT BREAST BX W LOC DEV 1ST LESION IMG BX SPEC US  GUIDE 12/22/2022 [facility]   BREAST BIOPSY  01/09/2023   MM LT RADIOACTIVE SEED LOC MAMMO GUIDE 01/09/2023 [facility]   BREAST LUMPECTOMY WITH RADIOACTIVE SEED AND SENTINEL LYMPH NODE BIOPSY Left 01/12/2023   Procedure: LEFT BREAST LUMPECTOMY WITH RADIOACTIVE SEED AND SENTINEL LYMPH NODE BIOPSY;  Surgeon: Belinda Cough, MD;  Location: [facility];  Service: General;  Laterality: Left;   CESAREAN SECTION  1979; 1987; 1989   HEMORRHOID SURGERY     IVC FILTER INSERTION N/A 10/12/2020   Procedure: IVC FILTER INSERTION;  Surgeon: Eliza Lonni RAMAN, MD;  Location: [facility] INVASIVE CV LAB;  Service: Cardiovascular;  Laterality: N/A;   OPEN REDUCTION INTERNAL FIXATION (ORIF) DISTAL RADIAL FRACTURE Left 08/25/2022   Procedure: OPEN REDUCTION INTERNAL FIXATION (ORIF) DISTAL RADIUS FRACTURE of nascent malunion;  Surgeon: Shari Easter, MD;  Location: [facility] OR;  Service: Orthopedics;  Laterality: Left;  regional with iv sedation   OPEN REDUCTION INTERNAL FIXATION (ORIF) DISTAL RADIAL FRACTURE Left 06/17/2023   Procedure: OPEN REDUCTION INTERNAL FIXATION (ORIF) DISTAL RADIUS FRACTURE;  Surgeon: Shari Easter, MD;  Location: [facility] OR;  Service: Orthopedics;  Laterality: Left;   TUBAL LIGATION  1990's   Social History:  reports that she quit smoking about 6 years ago. Her smoking use included cigarettes. She started smoking about 26 years ago. She has  a 10 pack-year smoking history. She has never used smokeless tobacco. She reports that she does not drink alcohol and does not use drugs.  Allergies  Allergen Reactions   Zonegran [Zonisamide] Other (See Comments)    Numbness and tingling over whole body   Chocolate Other (See Comments)    Triggers seizures   Codeine Nausea And Vomiting   Keppra  [Levetiracetam ] Other (See Comments)    Causes seizures   Lyrica  [Pregabalin ] Nausea And Vomiting   Olive Oil Rash    Family History  Problem Relation Age of Onset   Colon cancer Mother        started as vaginal cancer   Heart attack Mother        4 MIs, started in her 47s, also vaginal cancer and colon cancer   Cancer Mother    Heart attack Father        Died suddenly age 12 - autopsy revealed heart attack her patient   Cancer Brother        Sinus cancer   Cervical cancer  Maternal Aunt    Rectal cancer Neg Hx    Stomach cancer Neg Hx    Esophageal cancer Neg Hx     Prior to Admission medications   Medication Sig Start Date End Date Taking? Authorizing Provider  acetaminophen  (TYLENOL ) 325 MG tablet Take 2 tablets (650 mg total) by mouth every 4 (four) hours as needed for mild pain (pain score 1-3). 06/23/23   Hongalgi, Anand D, MD  anastrozole  (ARIMIDEX ) 1 MG tablet Take 1 tablet (1 mg total) by mouth daily. 10/20/23   Gudena, Vinay, MD  cyanocobalamin  (VITAMIN B12) 1000 MCG tablet Take 2 tablets (2,000 mcg total) by mouth daily. Patient not taking: Reported on 12/14/2023 02/28/23   Setzer, Sandra J, PA-C  DILANTIN  100 MG ER capsule Take 1 capsule twice a day (take with 30mg  capsule for total of 130mg  twice a day) 12/14/23   Georjean Darice HERO, MD  DILANTIN  30 MG ER capsule Take 1 capsule twice a day (Take with 100mg  capsule for total of 130mg  twice a day) 12/14/23   Georjean Darice HERO, MD  folic acid  (FOLVITE ) 1 MG tablet Take 1 tablet (1 mg total) by mouth daily. 10/27/23   Georjean Darice HERO, MD  LORazepam  (ATIVAN ) 1 MG tablet TAKE 1 TABLET BY  MOUTH AS DIRECTED AS NEEDED FOR 3 OR MORE SEIZURES IN 24 HOURS. DO NOT TAKE MORE THAN 2 TABLETS IN 24 HOURS. Strength: 1 mg 10/27/23   Georjean Darice HERO, MD  Na Sulfate-K Sulfate-Mg Sulfate concentrate (SUPREP) 17.5-3.13-1.6 GM/177ML SOLN Take as directed 12/16/23   Pyrtle, Gordy HERO, MD  potassium chloride  SA (KLOR-CON  M) 20 MEQ tablet Take 1 tablet (20 mEq total) by mouth every other day. 02/27/23   Setzer, Nena PARAS, PA-C  VIMPAT  200 MG TABS tablet Take 1 tablet (200 mg total) by mouth 2 (two) times daily. 12/14/23   Georjean Darice HERO, MD  warfarin (COUMADIN ) 6 MG tablet Take 1 tablet (6 mg total) by mouth daily at 4 PM. 04/07/23   Loistine Sober, NP    Physical Exam: Vitals:   02/16/24 0600 02/16/24 0630 02/16/24 0643 02/16/24 0727  BP: 98/61 (!) 95/52  115/65  Pulse: 85 83  95  Resp: (!) 25 (!) 21  18  Temp:   99.7 F (37.6 C) 99.8 F (37.7 C)  TempSrc:   Oral Oral  SpO2: 100% 99%  96%  Weight:      Height:      Constitutional: Elderly female who appears to be in no acute distress at this time. Eyes: PERRL, lids and conjunctivae normal ENMT: Mucous membranes are moist. Normal dentition.  Neck: normal, supple  Respiratory: clear to auscultation bilaterally, no wheezing, no crackles. Normal respiratory effort.   Cardiovascular: Regular rate and rhythm, no murmurs / rubs / gallops. No extremity edema.   Abdomen: no tenderness, no masses palpated.   Bowel sounds positive.  Musculoskeletal: no clubbing / cyanosis. No joint deformity upper and lower extremities. Good ROM, no contractures. Normal muscle tone.  Skin: no rashes, lesions, ulcers. No induration Neurologic: CN 2-12 grossly intact.  Strength 5/5 in all 4.  Head tremor present Psychiatric: Normal judgment and insight. Alert and oriented x person and place. Normal mood.   Data Reviewed:  Review of EKGs revealed atrial fibrillation with RVR with heart rates elevated up to 158.  Reviewed labs, imaging, and pertinent records as  documented  Assessment and Plan:  SIRS/sepsis, unclear source Patient presented with fever elevated up to 103 F with  tachycardia and tachypnea meeting SIRS criteria.  CRP was elevated 25.8 and initial lactic acid elevated at 2.3.  Initial thoughts were for urinary tract infection, but urinalysis without significant signs for infection.  Chest x-ray clear.  Influenza, COVID-19, and RSV screening negative.  CT scan of the head, abdomen, and pelvis without clear abnormality of concern.  Blood and urine cultures have been obtained.  Patient has been bolused IV fluids and started on antibiotics of Rocephin . - Admit to a progressive bed - Follow-up blood and urine cultures - Check complete respiratory virus panel - Check MRSA screen - Continue empiric antibiotics with Rocephin .  Consider broadening antibiotics if deemed medically appropriate  - Tylenol  as needed for fever  Metabolic encephalopathy Acute.  Patient's son noted her to be acutely altered and talking out of her head which previously occurred with a urinary tract infection.  No signs of a urinary tract infection noted currently.  Dilantin  level level noted to be low.  Possibly secondary to her being dehydrated.  Unclear cause of symptoms at this time, but son notes she seems to be improving since antibiotics and fluids. - Neurochecks - Check ammonia, urine drug screen  Paroxysmal atrial fibrillation While in the ED patient was noted to go in atrial fibrillation with heart rates elevated up to 158.  She is not on any medications for rate control normally at baseline.  Patient had been placed on amiodarone  drip and ultimately converted back into a sinus rhythm.   - Goal potassium at least 4 and magnesium  at least 2.  Replace electrolytes as needed to goal - Coumadin  per pharmacy  Transient hypotension Blood pressures noted to be as low as 84/47.  Patient had been bolused 2 L of IV fluids with some improvement in blood pressures. 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 INR was noted to be supratherapeutic at 5.4. - Coumadin  per pharmacy  History of intracranial hemorrhage Remote history of intercranial head bleed back in the 1980s  Seizure disorder Family noted concern for patient possibly having seizures prior to arrival.  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 - Continue anastrozole    Thrombocytopenia Chronic.  Platelet count 112 which appears around patient's baseline. - Continue to monitor  DVT prophylaxis: Coumadin  Advance Care Planning:   Code Status: Full Code    Consults: None  Family Communication: Son updated at bedside  Severity of Illness: The appropriate patient status for this patient is INPATIENT. Inpatient status is judged to be reasonable and necessary in order to provide the required intensity of service to ensure the patient's safety. The patient's presenting symptoms, physical exam findings, and initial radiographic and laboratory data in the context of their chronic comorbidities is felt to place them at high risk for further clinical deterioration. Furthermore, it is not anticipated that the patient will be medically stable for discharge from the hospital within 2 midnights of admission.   * I certify that at the point of admission it is my clinical judgment that the patient will require inpatient hospital care spanning beyond 2 midnights from the point of admission due to high intensity  of service, high risk for further deterioration and high frequency of surveillance required.*  Author: Maximino DELENA Sharps,  MD 02/16/2024 8:08 AM  For on call review [email or web address].

## 2024-02-16 NOTE — Progress Notes (Signed)
 PHARMACY - ANTICOAGULATION CONSULT NOTE  Pharmacy Consult for warfarin Indication: atrial fibrillation and DVT  Allergies  Allergen Reactions   Zonegran [Zonisamide] Other (See Comments)    Numbness and tingling over whole body   Chocolate Other (See Comments)    Triggers seizures   Codeine Nausea And Vomiting   Keppra  [Levetiracetam ] Other (See Comments)    Causes seizures   Lyrica  [Pregabalin ] Nausea And Vomiting   Olive Oil Rash    Patient Measurements: Height: 5' 7 (170.2 cm) Weight: 59 kg (130 lb) IBW/kg (Calculated) : 61.6 HEPARIN  DW (KG): 59  Vital Signs: Temp: 102.4 F (39.1 C) (07/15 1119) Temp Source: Oral (07/15 1119) BP: 186/101 (07/15 1030) Pulse Rate: 89 (07/15 1030)  Labs: Recent Labs    02/15/24 2248 02/16/24 0043 02/16/24 0424  HGB 13.5  --   --   HCT 40.1  --   --   PLT 112*  --   --   APTT  --   --  75*  LABPROT  --   --  51.3*  INR  --   --  5.4*  CREATININE  --  0.81  --     Estimated Creatinine Clearance: 64.5 mL/min (by C-G formula based on SCr of 0.81 mg/dL).   Medical History: Past Medical History:  Diagnosis Date   Acute bronchitis 08/06/2022   Acute respiratory failure with hypoxia (HCC) 03/31/2015   Altered mental status    Asthma    Ataxia 08/26/2019   Atypical chest pain 02/17/2017   Cerebral hemorrhage (HCC) 1989   Chronic back pain    Closed fracture of distal end of left radius 07/17/2022   Closed fracture of fifth metatarsal bone 01/04/2019   Closed fracture of proximal phalanx of great toe 01/04/2019   Closed nondisplaced fracture of styloid process of left radius 08/06/2022   COPD (chronic obstructive pulmonary disease) (HCC)    no meds per daughter   COPD with exacerbation (HCC) 03/30/2015   Costochondritis 02/17/2017   COVID-19 virus infection 04/02/2020   DDD (degenerative disc disease) 06/12/2013   DVT (deep venous thrombosis) (HCC)    she's had several since 1990   GERD (gastroesophageal reflux  disease)    no meds per daughter   Headache    usually around time when she's had a seizure   History of blood transfusion    when she was a baby   History of kidney stones    History of stomach ulcers    Hypoxia 01/18/2015   Lactic acidosis 04/02/2020   Neuropathy    Seizures (HCC)    because of her brain surgery  last one 07/28/22   Sepsis secondary to UTI (HCC) 01/17/2015   Stroke [facility]) 1990's   family denies residual on 11/09/2014   Tunnel vision    since brain OR    Assessment: 69 yof presented to the ED with AMS. She is chronically anticoagulated on warfarin. Her INR is supratherapeutic at 5.4. No overt bleeding noted.   Goal of Therapy:  INR 2-3 Monitor platelets by anticoagulation protocol: Yes   Plan:  No warfarin today Daily INR  Darshan Solanki, Vernell Helling 02/16/2024,11:22 AM

## 2024-02-16 NOTE — ED Notes (Signed)
 EDP advised of increased INR

## 2024-02-16 NOTE — ED Notes (Addendum)
 EDP notified of pt hypotension; EDP advised to pause amio drip.

## 2024-02-17 ENCOUNTER — Encounter (HOSPITAL_COMMUNITY): Payer: Self-pay | Admitting: Internal Medicine

## 2024-02-17 ENCOUNTER — Inpatient Hospital Stay (HOSPITAL_COMMUNITY)

## 2024-02-17 DIAGNOSIS — I480 Paroxysmal atrial fibrillation: Secondary | ICD-10-CM | POA: Diagnosis not present

## 2024-02-17 DIAGNOSIS — R569 Unspecified convulsions: Secondary | ICD-10-CM | POA: Diagnosis not present

## 2024-02-17 DIAGNOSIS — A419 Sepsis, unspecified organism: Secondary | ICD-10-CM | POA: Diagnosis not present

## 2024-02-17 DIAGNOSIS — G039 Meningitis, unspecified: Secondary | ICD-10-CM

## 2024-02-17 DIAGNOSIS — Z853 Personal history of malignant neoplasm of breast: Secondary | ICD-10-CM | POA: Diagnosis not present

## 2024-02-17 DIAGNOSIS — G9340 Encephalopathy, unspecified: Secondary | ICD-10-CM

## 2024-02-17 LAB — CBC
HCT: 30 % — ABNORMAL LOW (ref 36.0–46.0)
Hemoglobin: 9.8 g/dL — ABNORMAL LOW (ref 12.0–15.0)
MCH: 33.3 pg (ref 26.0–34.0)
MCHC: 32.7 g/dL (ref 30.0–36.0)
MCV: 102 fL — ABNORMAL HIGH (ref 80.0–100.0)
Platelets: 125 K/uL — ABNORMAL LOW (ref 150–400)
RBC: 2.94 MIL/uL — ABNORMAL LOW (ref 3.87–5.11)
RDW: 12.1 % (ref 11.5–15.5)
WBC: 4.8 K/uL (ref 4.0–10.5)
nRBC: 0 % (ref 0.0–0.2)

## 2024-02-17 LAB — BASIC METABOLIC PANEL WITH GFR
Anion gap: 10 (ref 5–15)
BUN: <5 mg/dL — ABNORMAL LOW (ref 8–23)
CO2: 24 mmol/L (ref 22–32)
Calcium: 7.8 mg/dL — ABNORMAL LOW (ref 8.9–10.3)
Chloride: 103 mmol/L (ref 98–111)
Creatinine, Ser: 0.62 mg/dL (ref 0.44–1.00)
GFR, Estimated: >60 mL/min (ref >60)
Glucose, Bld: 111 mg/dL — ABNORMAL HIGH (ref 70–99)
Potassium: 3.3 mmol/L — ABNORMAL LOW (ref 3.5–5.1)
Sodium: 137 mmol/L (ref 135–145)

## 2024-02-17 LAB — RAPID URINE DRUG SCREEN, HOSP PERFORMED
Amphetamines: NOT DETECTED
Barbiturates: POSITIVE — AB
Benzodiazepines: NOT DETECTED
Cocaine: NOT DETECTED
Opiates: NOT DETECTED
Tetrahydrocannabinol: NOT DETECTED

## 2024-02-17 LAB — PROTIME-INR
INR: 5.3 (ref 0.8–1.2)
Prothrombin Time: 50.9 s — ABNORMAL HIGH (ref 11.4–15.2)

## 2024-02-17 LAB — MRSA NEXT GEN BY PCR, NASAL: MRSA by PCR Next Gen: NOT DETECTED

## 2024-02-17 LAB — AMMONIA: Ammonia: 26 umol/L (ref 9–35)

## 2024-02-17 LAB — TSH: TSH: 0.957 u[IU]/mL (ref 0.350–4.500)

## 2024-02-17 MED ORDER — SODIUM CHLORIDE 0.9 % IV BOLUS
500.0000 mL | Freq: Once | INTRAVENOUS | Status: AC
  Administered 2024-02-17: 500 mL via INTRAVENOUS

## 2024-02-17 MED ORDER — GADOBUTROL 1 MMOL/ML IV SOLN
6.0000 mL | Freq: Once | INTRAVENOUS | Status: AC | PRN
  Administered 2024-02-17: 6 mL via INTRAVENOUS

## 2024-02-17 MED ORDER — VANCOMYCIN HCL 750 MG/150ML IV SOLN
750.0000 mg | Freq: Two times a day (BID) | INTRAVENOUS | Status: DC
  Administered 2024-02-17 – 2024-02-20 (×7): 750 mg via INTRAVENOUS
  Filled 2024-02-17 (×8): qty 150

## 2024-02-17 MED ORDER — VANCOMYCIN HCL IN DEXTROSE 1-5 GM/200ML-% IV SOLN
1000.0000 mg | Freq: Once | INTRAVENOUS | Status: AC
  Administered 2024-02-17: 1000 mg via INTRAVENOUS
  Filled 2024-02-17: qty 200

## 2024-02-17 MED ORDER — SODIUM CHLORIDE 0.9 % IV SOLN
2.0000 g | Freq: Two times a day (BID) | INTRAVENOUS | Status: DC
  Administered 2024-02-17 – 2024-02-21 (×9): 2 g via INTRAVENOUS
  Filled 2024-02-17 (×8): qty 20

## 2024-02-17 NOTE — Plan of Care (Signed)
 ?  Problem: Coping: ?Goal: Level of anxiety will decrease ?Outcome: Progressing ?  ?Problem: Safety: ?Goal: Ability to remain free from injury will improve ?Outcome: Progressing ?  ?

## 2024-02-17 NOTE — Progress Notes (Addendum)
 PROGRESS NOTE        PATIENT DETAILS Name: [name] Age: 68 y.o. Sex: female Date of Birth: 08-10-1955 Admit Date: 02/15/2024 Admitting Physician Camila DELENA Ned, MD ERE:Yjbzd, Anthony RAMAN, FNP  Brief Summary: Patient is a 68 y.o.  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  Per H&P-family concerned that she has had possible breakthrough seizures at home.  Subsequently admitted to the hospitalist service.  Significant events: 7/15>> admit to [facility].  Significant studies: 7/15>> CT head: No acute intracranial abnormality. 7/15>> CT abdomen/pelvis: No acute intracranial abnormality. 7/15>> EEG: No definite seizures-with evidence of epileptogenicity/cortical dysfunction from right hemisphere.  Significant microbiology data: 7/14>> COVID/influenza/RSV PCR: Negative 7/14>> respiratory virus panel: Negative 7/15>> blood culture: No growth.  Procedures: None  Consults: ID Neurology.  Subjective: No family at bedside-febrile overnight-required numerous IV fluid boluses.  Denies headache today but did have headaches over the past several days.  Although improved-she still requires some redirection at times.  Claims that her birthdate is coming up soon-she will turn 38.  Objective: Vitals: Blood pressure (!) 112/52, pulse 92, temperature (!) 102.5 F (39.2 C), temperature source Oral, resp. rate (!) 26, height 5' 7 (1.702 m), weight 59 kg, SpO2 92%.   Exam: Gen Exam:Alert awake-not in any distress HEENT:atraumatic, normocephalic.  No neck stiffness.  Negative meningeal signs. Chest: B/L clear to auscultation anteriorly CVS:S1S2 regular Abdomen:soft non tender, non distended Extremities:no edema Neurology: Non focal Skin: no rash  Pertinent Labs/Radiology:    Latest Ref Rng & Units 02/17/2024    4:01 AM 02/15/2024   10:48 PM 12/07/2023     9:10 AM  CBC  WBC 4.0 - 10.5 K/uL 4.8  5.9  3.2   Hemoglobin 12.0 - 15.0 g/dL 9.8  86.4  86.2   Hematocrit 36.0 - 46.0 % 30.0  40.1  40.7   Platelets 150 - 400 K/uL 125  112  129.0     Lab Results  Component Value Date   NA 137 02/17/2024   K 3.3 (L) 02/17/2024   CL 103 02/17/2024   CO2 24 02/17/2024     Assessment/Plan: Sepsis Unclear etiology-given she had confusion/some intermittent headaches-concern for meningoencephalitis but she has no meningeal signs-however she does not appear to be acutely toxic-and mentation seems to have improved overnight-while only on Rocephin . Although she does claim to have some intermittent dysuria-UA is negative-and does not appear to be consistent with UTI Febrile again this morning-will repeat blood cultures Have consulted infectious disease-await further recommendations.May need LP at some point if no other source of infection is apparent-however patient on Coumadin  with supratherapeutic INR.  Possible breakthrough seizure Seizure threshold probably lowered due to sepsis physiology EEG results as above-likely reflecting prior ICH Remains on phenytoin /Vimpat  Neurology consulted  Acute metabolic encephalopathy Probably due to combination of sepsis physiology/possible breakthrough seizures See above regarding unclear etiology for sepsis physiology. EEG as above MRI brain ordered 7/16.  History of DVT Coumadin  on hold-INR supratherapeutic.  PAF with RVR Briefly required amiodarone  infusion on initial presentation-subsequently converted back to sinus rhythm. Coumadin  on hold-INR supratherapeutic.  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  Mild extrahepatic biliary ductal dilatation seen incidentally on CT abdomen This is a known history of-has been worked up in the past-MRI abdomen in January 2025-no  choledocholithiasis-LFTs are stable-she does not have abdominal pain.  Suspect this is an incidental and chronic finding.  History  of breast cancer Continue anastrozole   Thrombocytopenia Mild Chronic issue Follow-up CBC periodically  Hypokalemia Replete/recheck  Code status:   Code Status: Full Code   DVT Prophylaxis:supra-therapeutic INR   Family Communication:Daughter [phone] updated 7/16   Disposition Plan: Status is: Inpatient Remains inpatient appropriate because: Severity of illness   Planned Discharge Destination:Home health   Diet: Diet Order             Diet regular Room service appropriate? Yes; Fluid consistency: Thin  Diet effective now                     Antimicrobial agents: Anti-infectives (From admission, onward)    Start     Dose/Rate Route Frequency Ordered Stop   02/18/24 0030  vancomycin  (VANCOREADY) IVPB 750 mg/150 mL        750 mg 150 mL/hr over 60 Minutes Intravenous Every 12 hours 02/17/24 1116     02/17/24 1230  vancomycin  (VANCOCIN ) IVPB 1000 mg/200 mL premix        1,000 mg 200 mL/hr over 60 Minutes Intravenous  Once 02/17/24 1116     02/17/24 1200  cefTRIAXone  (ROCEPHIN ) 2 g in sodium chloride  0.9 % 100 mL IVPB        2 g 200 mL/hr over 30 Minutes Intravenous Every 12 hours 02/17/24 1112     02/17/24 1000  cefTRIAXone  (ROCEPHIN ) 2 g in sodium chloride  0.9 % 100 mL IVPB  Status:  Discontinued        2 g 200 mL/hr over 30 Minutes Intravenous Daily 02/16/24 0548 02/17/24 1112   02/16/24 0545  cefTRIAXone  (ROCEPHIN ) 2 g in sodium chloride  0.9 % 100 mL IVPB        2 g 200 mL/hr over 30 Minutes Intravenous  Once 02/16/24 0540 02/16/24 0630        MEDICATIONS: Scheduled Meds:  anastrozole   1 mg Oral Daily   folic acid   1 mg Oral Daily   lacosamide   200 mg Oral BID   phenytoin   130 mg Oral BID   potassium chloride   60 mEq Oral Daily   sodium chloride  flush  3 mL Intravenous Q12H   Warfarin - Pharmacist Dosing Inpatient   Does not apply q1600   Continuous Infusions:  cefTRIAXone  (ROCEPHIN )  IV Stopped (02/17/24 1153)   vancomycin  1,000 mg  (02/17/24 1212)   [START ON 02/18/2024] vancomycin      PRN Meds:.acetaminophen  **OR** acetaminophen , albuterol , butalbital -acetaminophen -caffeine , LORazepam , ondansetron  **OR** ondansetron  (ZOFRAN ) IV, sodium chloride  flush   I have personally reviewed following labs and imaging studies  LABORATORY DATA: CBC: Recent Labs  Lab 02/15/24 2248 02/17/24 0401  WBC 5.9 4.8  HGB 13.5 9.8*  HCT 40.1 30.0*  MCV 100.0 102.0*  PLT 112* 125*    Basic Metabolic Panel: Recent Labs  Lab 02/16/24 0043 02/16/24 0630 02/17/24 0401  NA 134*  --  137  K 3.1*  --  3.3*  CL 102  --  103  CO2 20*  --  24  GLUCOSE 144*  --  111*  BUN 15  --  <5*  CREATININE 0.81  --  0.62  CALCIUM 8.3*  --  7.8*  MG  --  2.1  --     GFR: Estimated Creatinine Clearance: 65.3 mL/min (by C-G formula based on SCr of 0.62 mg/dL).  Liver Function Tests: Recent Labs  Lab 02/16/24  0043  AST 41  ALT 27  ALKPHOS 68  BILITOT 1.1  PROT 6.1*  ALBUMIN 2.7*   No results for input(s): LIPASE, AMYLASE in the last 168 hours. Recent Labs  Lab 02/17/24 0401  AMMONIA 26    Coagulation Profile: Recent Labs  Lab 02/16/24 0424 02/17/24 0401  INR 5.4* 5.3*    Cardiac Enzymes: No results for input(s): CKTOTAL, CKMB, CKMBINDEX, TROPONINI in the last 168 hours.  BNP (last 3 results) No results for input(s): PROBNP in the last 8760 hours.  Lipid Profile: No results for input(s): CHOL, HDL, LDLCALC, TRIG, CHOLHDL, LDLDIRECT in the last 72 hours.  Thyroid  Function Tests: Recent Labs    02/17/24 0401  TSH 0.957    Anemia Panel: No results for input(s): VITAMINB12, FOLATE, FERRITIN, TIBC, IRON, RETICCTPCT in the last 72 hours.  Urine analysis:    Component Value Date/Time   COLORURINE AMBER (A) 02/15/2024 2346   APPEARANCEUR HAZY (A) 02/15/2024 2346   LABSPEC 1.021 02/15/2024 2346   PHURINE 5.0 02/15/2024 2346   GLUCOSEU NEGATIVE 02/15/2024 2346   HGBUR SMALL  (A) 02/15/2024 2346   BILIRUBINUR SMALL (A) 02/15/2024 2346   KETONESUR NEGATIVE 02/15/2024 2346   PROTEINUR 100 (A) 02/15/2024 2346   UROBILINOGEN 0.2 01/17/2015 2350   NITRITE NEGATIVE 02/15/2024 2346   LEUKOCYTESUR NEGATIVE 02/15/2024 2346    Sepsis Labs: Lactic Acid, Venous    Component Value Date/Time   LATICACIDVEN 0.8 02/16/2024 9366    MICROBIOLOGY: Recent Results (from the past 240 hours)  Resp panel by RT-PCR (RSV, Flu A&B, Covid) Anterior Nasal Swab     Status: None   Collection Time: 02/15/24 11:46 PM   Specimen: Anterior Nasal Swab  Result Value Ref Range Status   SARS Coronavirus 2 by RT PCR NEGATIVE NEGATIVE Final   Influenza A by PCR NEGATIVE NEGATIVE Final   Influenza B by PCR NEGATIVE NEGATIVE Final    Comment: (NOTE) The Xpert Xpress SARS-CoV-2/FLU/RSV plus assay is intended as an aid in the diagnosis of influenza from Nasopharyngeal swab specimens and should not be used as a sole basis for treatment. Nasal washings and aspirates are unacceptable for Xpert Xpress SARS-CoV-2/FLU/RSV testing.  Fact Sheet for Patients: BloggerCourse.com  Fact Sheet for Healthcare Providers: SeriousBroker.it  This test is not yet approved or cleared by the [place]  FDA and has been authorized for detection and/or diagnosis of SARS-CoV-2 by FDA under an Emergency Use Authorization (EUA). This EUA will remain in effect (meaning this test can be used) for the duration of the COVID-19 declaration under Section 564(b)(1) of the Act, 21 U.S.C. section 360bbb-3(b)(1), unless the authorization is terminated or revoked.     Resp Syncytial Virus by PCR NEGATIVE NEGATIVE Final    Comment: (NOTE) Fact Sheet for Patients: BloggerCourse.com  Fact Sheet for Healthcare Providers: SeriousBroker.it  This test is not yet approved or cleared by the [place]  FDA and has been  authorized for detection and/or diagnosis of SARS-CoV-2 by FDA under an Emergency Use Authorization (EUA). This EUA will remain in effect (meaning this test can be used) for the duration of the COVID-19 declaration under Section 564(b)(1) of the Act, 21 U.S.C. section 360bbb-3(b)(1), unless the authorization is terminated or revoked.  Performed at [facility], [address] [place], [place] [address]   Respiratory (~20 pathogens) panel by PCR     Status: None   Collection Time: 02/15/24 11:46 PM   Specimen: Nasopharyngeal Swab; Respiratory  Result Value Ref Range Status  Adenovirus NOT DETECTED NOT DETECTED Final   Coronavirus 229E NOT DETECTED NOT DETECTED Final    Comment: (NOTE) The Coronavirus on the Respiratory Panel, DOES NOT test for the novel  Coronavirus (2019 nCoV)    Coronavirus HKU1 NOT DETECTED NOT DETECTED Final   Coronavirus NL63 NOT DETECTED NOT DETECTED Final   Coronavirus OC43 NOT DETECTED NOT DETECTED Final   Metapneumovirus NOT DETECTED NOT DETECTED Final   Rhinovirus / Enterovirus NOT DETECTED NOT DETECTED Final   Influenza A NOT DETECTED NOT DETECTED Final   Influenza B NOT DETECTED NOT DETECTED Final   Parainfluenza Virus 1 NOT DETECTED NOT DETECTED Final   Parainfluenza Virus 2 NOT DETECTED NOT DETECTED Final   Parainfluenza Virus 3 NOT DETECTED NOT DETECTED Final   Parainfluenza Virus 4 NOT DETECTED NOT DETECTED Final   Respiratory Syncytial Virus NOT DETECTED NOT DETECTED Final   Bordetella pertussis NOT DETECTED NOT DETECTED Final   Bordetella Parapertussis NOT DETECTED NOT DETECTED Final   Chlamydophila pneumoniae NOT DETECTED NOT DETECTED Final   Mycoplasma pneumoniae NOT DETECTED NOT DETECTED Final    Comment: Performed at [facility], [address], [place], [place] [address]  Blood culture (routine x 2)     Status: None (Preliminary result)   Collection Time: 02/16/24  9:33 PM   Specimen: BLOOD  Result Value Ref Range Status    Specimen Description BLOOD SITE NOT SPECIFIED  Final   Special Requests   Final    BOTTLES DRAWN AEROBIC AND ANAEROBIC Blood Culture adequate volume   Culture   Final    NO GROWTH < 12 HOURS Performed at [facility], [address] [place], [place] [address]    Report Status PENDING  Incomplete  Blood culture (routine x 2)     Status: None (Preliminary result)   Collection Time: 02/16/24  9:34 PM   Specimen: BLOOD  Result Value Ref Range Status   Specimen Description BLOOD SITE NOT SPECIFIED  Final   Special Requests   Final    BOTTLES DRAWN AEROBIC AND ANAEROBIC Blood Culture adequate volume   Culture   Final    NO GROWTH < 12 HOURS Performed at [facility], [address] [place], [place] [address]    Report Status PENDING  Incomplete    RADIOLOGY STUDIES/RESULTS: EEG adult Result Date: 02/16/2024 Shelton Arlin KIDD, MD     02/16/2024  9:33 AM Patient Name: ZAKYRIA METZINGER MRN: [id number] Epilepsy Attending: Arlin KIDD Shelton Referring Physician/Provider: Debby Camila LABOR, MD Date: 02/16/2024 Duration: 23.47 mins Patient history: 68yo F with ams. EEG to evaluate for seizure. Level of alertness: Awake AEDs during EEG study: PHT, LCM Technical aspects: This EEG study was done with scalp electrodes positioned according to the 10-20 International system of electrode placement. Electrical activity was reviewed with band pass filter of 1-70Hz , sensitivity of 7 uV/mm, display speed of 7mm/sec with a 60Hz  notched filter applied as appropriate. EEG data were recorded continuously and digitally stored.  Video monitoring was available and reviewed as appropriate. Description: The posterior dominant rhythm consists of 8-9 Hz activity of moderate voltage (25-35 uV) seen predominantly in posterior head regions, symmetric and reactive to eye opening and eye closing. Sleep was characterized by sleep spindles (12-14hz ), maximal fronto-central region. EEG showed continuous polymorphic sharply  contoured rhythmic 3 to 6 Hz theta-delta slowing in right hemisphere, maximal right posterior quadrant.  Intermittent generalized 3-5hz  theta- delta slowing was also noted abundant spike and waves  were noted in right hemisphere, maximal right posterior quadrant, at times qasi- periodic at 1-2Hz .  Photic driving was not seen during photic stimulation.  Hyperventilation was not performed.    ABNORMALITY - Spike and waves, right hemisphere, maximal right posterior quadrant - Continuous slow, right hemisphere, maximal right posterior quadrant -Intermittent slow, generalized  IMPRESSION: This study showed evidence of epileptogenicity and cortical dysfunction arising from right hemisphere, maximal right posterior quadrant. This EEG pattern is on the ictal-interictal continuum with potential for seizures.  Additionally there is mild to moderate diffuse encephalopathy.  No definite seizures were noted.  Arlin MALVA Krebs   CT ABDOMEN PELVIS W CONTRAST Result Date: 02/16/2024 CLINICAL DATA:  Acute nonlocalized abdominal pain, sepsis EXAM: CT ABDOMEN AND PELVIS WITH CONTRAST TECHNIQUE: Multidetector CT imaging of the abdomen and pelvis was performed using the standard protocol following bolus administration of intravenous contrast. RADIATION DOSE REDUCTION: This exam was performed according to the departmental dose-optimization program which includes automated exposure control, adjustment of the mA and/or kV according to patient size and/or use of iterative reconstruction technique. CONTRAST:  75mL OMNIPAQUE  IOHEXOL  350 MG/ML SOLN COMPARISON:  None Available. FINDINGS: Lower chest: No acute abnormality. Hepatobiliary: Riedel's lobe anatomy, an anatomic variant. Two small foci of portal-hepatic venous vascular shunting seen within the subcapsular, inferior right hepatic lobe. No enhancing intrahepatic mass. The gallbladder is unremarkable. There is mild extrahepatic biliary ductal dilation with bile duct measuring up to 10  mm in diameter and abruptly terminating at the ampulla in the region of the duodenal diverticulum. No intrahepatic biliary ductal dilation. Pancreas: Large duodenal diverticulum within the head of the pancreas. The pancreas is otherwise unremarkable. Spleen: Unremarkable Adrenals/Urinary Tract: The adrenal glands are unremarkable. The kidneys are normal. The bladder is unremarkable. Stomach/Bowel: Stomach is within normal limits. Appendix appears normal. No evidence of bowel wall thickening, distention, or inflammatory changes. Vascular/Lymphatic: Extensive aortoiliac atherosclerotic calcification. No aortic aneurysm. Inferior vena cava filter is seen within the infrarenal cava. There is dilation of the left ovarian vein with numerous left adnexal varices identified suggesting changes of ovarian vein reflux and pelvic venous insufficiency. Pudendal varices are seen bilaterally suggesting superficial venous insufficiency involving the saphenofemoral junctions bilaterally. No pathologic adenopathy within the abdomen and pelvis. Reproductive: Uterus and bilateral adnexa are unremarkable. Other: Tiny fat containing right inguinal hernia Musculoskeletal: No acute bone abnormality. No lytic or blastic bone lesion. Osseous structures are age appropriate. IMPRESSION: 1. No acute intra-abdominal pathology identified. 2. Mild extrahepatic biliary ductal dilation with bile duct measuring up to 10 mm in diameter and abruptly terminating at the ampulla in the region of a large duodenal diverticulum. This is nonspecific though mass effect by the duodenal diverticulum or an underlying stricture could result in this appearance. Correlation with liver function tests is recommended. If abnormal, ERCP or MRCP examination may be helpful for further evaluation. 3. Dilated left ovarian vein with numerous left adnexal varices suggesting changes of pelvic venous insufficiency. Additionally, bilateral superficial venous varices are seen  involving the saphenofemoral junctions bilaterally suggest bilateral lower extremity superficial venous insufficiency. Electronically Signed   By: Dorethia Molt M.D.   On: 02/16/2024 03:10   CT Head Wo Contrast Result Date: 02/16/2024 CLINICAL DATA:  Mental status change, unknown cause EXAM: CT HEAD WITHOUT CONTRAST TECHNIQUE: Contiguous axial images were obtained from the base of the skull through the vertex without intravenous contrast. RADIATION DOSE REDUCTION: This exam was performed according to the departmental dose-optimization program which includes automated exposure control, adjustment of  the mA and/or kV according to patient size and/or use of iterative reconstruction technique. COMPARISON:  CT head 06/15/2023 FINDINGS: Brain: No intracranial hemorrhage, mass effect, or evidence of acute infarct. No hydrocephalus. No extra-axial fluid collection. 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 Communication with the right lateral ventricle is unchanged. Stable ventricular size. Vascular: No hyperdense vessel. Intracranial arterial calcification. Skull: No fracture or focal lesion. Sinuses/Orbits: No acute finding. Other: None. IMPRESSION: 1. No acute intracranial abnormality. 2. 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 Electronically Signed   By: Norman Gatlin M.D.   On: 02/16/2024 00:12   DG Chest Portable 1 View Result Date: 02/15/2024 CLINICAL DATA:  Sepsis, altered mental status EXAM: PORTABLE CHEST 1 VIEW COMPARISON:  03/25/2023 FINDINGS: Lungs are well expanded, symmetric, and clear. No pneumothorax or pleural effusion. Cardiac size within normal limits. Pulmonary vascularity is normal. Osseous structures are age-appropriate. Healed right mid diaphyseal clavicle fracture. No acute bone abnormality. IMPRESSION: No active disease. Electronically Signed   By: Dorethia Molt M.D.   On: 02/15/2024  23:32     LOS: 1 day   Donalda Applebaum, MD  [facility]    To contact the attending provider between 7A-7P or the covering provider during after hours 7P-7A, please log into the web site [email or web address] and access using universal [place] password for that web site. If you do not have the password, please call the hospital operator.  02/17/2024, 12:13 PM

## 2024-02-17 NOTE — Progress Notes (Signed)
 PHARMACY - ANTICOAGULATION CONSULT NOTE  Pharmacy Consult for warfarin Indication: atrial fibrillation and DVT  Allergies  Allergen Reactions   Zonegran [Zonisamide] Other (See Comments)    Numbness and tingling over whole body   Chocolate Other (See Comments)    Triggers seizures   Codeine Nausea And Vomiting   Keppra  [Levetiracetam ] Other (See Comments)    Causes seizures   Lyrica  [Pregabalin ] Nausea And Vomiting   Olive Oil Rash    Patient Measurements: Height: 5' 7 (170.2 cm) Weight: 59 kg (130 lb) IBW/kg (Calculated) : 61.6 HEPARIN  DW (KG): 59  Vital Signs: Temp: 98.4 F (36.9 C) (07/16 0700) Temp Source: Oral (07/16 0700) BP: 101/58 (07/16 0700) Pulse Rate: 89 (07/16 0453)  Labs: Recent Labs    02/15/24 2248 02/16/24 0043 02/16/24 0424 02/17/24 0401  HGB 13.5  --   --  9.8*  HCT 40.1  --   --  30.0*  PLT 112*  --   --  125*  APTT  --   --  75*  --   LABPROT  --   --  51.3* 50.9*  INR  --   --  5.4* 5.3*  CREATININE  --  0.81  --  0.62    Estimated Creatinine Clearance: 65.3 mL/min (by C-G formula based on SCr of 0.62 mg/dL).   Medical History: Past Medical History:  Diagnosis Date   Acute bronchitis 08/06/2022   Acute respiratory failure with hypoxia (HCC) 03/31/2015   Altered mental status    Asthma    Ataxia 08/26/2019   Atypical chest pain 02/17/2017   Cerebral hemorrhage (HCC) 1989   Chronic back pain    Closed fracture of distal end of left radius 07/17/2022   Closed fracture of fifth metatarsal bone 01/04/2019   Closed fracture of proximal phalanx of great toe 01/04/2019   Closed nondisplaced fracture of styloid process of left radius 08/06/2022   COPD (chronic obstructive pulmonary disease) (HCC)    no meds per daughter   COPD with exacerbation (HCC) 03/30/2015   Costochondritis 02/17/2017   COVID-19 virus infection 04/02/2020   DDD (degenerative disc disease) 06/12/2013   DVT (deep venous thrombosis) (HCC)    she's had several  since 1990   GERD (gastroesophageal reflux disease)    no meds per daughter   Headache    usually around time when she's had a seizure   History of blood transfusion    when she was a baby   History of kidney stones    History of stomach ulcers    Hypoxia 01/18/2015   Lactic acidosis 04/02/2020   Neuropathy    Seizures (HCC)    because of her brain surgery  last one 07/28/22   Sepsis secondary to UTI (HCC) 01/17/2015   Stroke [facility]) 1990's   family denies residual on 11/09/2014   Tunnel vision    since brain OR    Assessment: 51 yof presented to the ED with AMS. She is chronically anticoagulated on warfarin. Her INR  remains supratherapeutic at 5.3. No overt bleeding noted.   Goal of Therapy:  INR 2-3 Monitor platelets by anticoagulation protocol: Yes   Plan:  Continue to hold warfarin today.  Daily INR  Feliciano GORMAN Close 02/17/2024,8:20 AM

## 2024-02-17 NOTE — Consult Note (Addendum)
 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  His disease engaged as patient continued to have fevers. #Fever with concern for meningitis #Elevated INR #Seizures on Vimpat  and Dilantin  - Patient presented with altered mental status, disorganized speech and unusual behavior.  She has a history of seizure.  On arrival she was febrile with temp 103, WBC 5.8K.  She started on ceftriaxone . - CT head abdomen pelvis was pretty unrevealing.  CT head shows chronic encephalomalacia. - Clinically she is improved today per my discussion with primary.  During my visit she was AO x 3 although she was having some photophobia and headaches. -She has no abdominal pain no dysuria. -EEG done which notes pattern is on the ictal-interictal continuum and with potential for seizures.  Phenytoin  level on 7/fifteen 8.3 Recommendations:  -Follow-up blood cultures - Add vancomycin , change ceftriaxone  to 2 g twice daily - Patient is slightly improving on ceftriaxone /will hold off on adding additional coverage, if continues to have high fevers tomorrow are clinically deteriorates then we will add acyclovir  and ampicillin . - Follow-up neurology recs, LP would be helpful in establishing diagnosis of meningitis.  Although patient  has not back pain she does have headache and photophobia which seems consistent meningitis. -UDS positive for barbiturates, she is on phenytoin  which can cross-react and cause false positive -Follow-up MRI brain results - Plan discussed with primary - Droplet precautions  Evaluation of this patient requires complex antimicrobial therapy evaluation and counseling + isolation needs for disease transmission risk assessment and mitigation   Microbiology:   Antibiotics: Ceftriaxone  7/14  Cultures: Blood 7/15  Urine  Other   HPI: [name] is a 68 y.o. female with past medical history of PAF on chronic anticoagulations, CVA, headaches, seizures, DVT, COPD, chronic back pain, gait disturbance and GERD presented with altered mental status and possibly seizure activity.  They noted significant change in her condition prior to admission.  On arrival patient temp of 103, WBC 5.8K.  CT head was abnormal except for chronic encephalomalacia.  UA negative nitrites negative leukocyte.  CT abdomen pelvis did not reveal intra-abdominal pathology, noted mild intrahepatic biliary duct dilatation bile duct measuring 10 mm.  Started on ceftriaxone .  Patient continued with fever ID engaged.   Review of Systems: Review of Systems  All other systems reviewed and are negative.   Past Medical History:  Diagnosis Date   Acute bronchitis 08/06/2022   Acute respiratory failure with hypoxia (HCC) 03/31/2015   Altered mental status    Asthma    Ataxia 08/26/2019   Atypical chest pain 02/17/2017   Cerebral hemorrhage (HCC) 1989   Chronic back pain    Closed fracture of distal end of left radius 07/17/2022   Closed fracture of fifth metatarsal bone 01/04/2019   Closed  fracture of proximal phalanx of great toe 01/04/2019   Closed nondisplaced fracture of styloid process of left radius 08/06/2022   COPD (chronic obstructive pulmonary disease) (HCC)    no meds per daughter   COPD with exacerbation (HCC)  03/30/2015   Costochondritis 02/17/2017   COVID-19 virus infection 04/02/2020   DDD (degenerative disc disease) 06/12/2013   DVT (deep venous thrombosis) (HCC)    she's had several since 1990   GERD (gastroesophageal reflux disease)    no meds per daughter   Headache    usually around time when she's had a seizure   History of blood transfusion    when she was a baby   History of kidney stones    History of stomach ulcers    Hypoxia 01/18/2015   Lactic acidosis 04/02/2020   Neuropathy    Seizures (HCC)    because of her brain surgery  last one 07/28/22   Sepsis secondary to UTI (HCC) 01/17/2015   Stroke [facility]) 1990's   family denies residual on 11/09/2014   Tunnel vision    since brain OR    Social History   Tobacco Use   Smoking status: Former    Current packs/day: 0.00    Average packs/day: 0.5 packs/day for 20.0 years (10.0 ttl pk-yrs)    Types: Cigarettes    Start date: 10/26/1997    Quit date: 10/26/2017    Years since quitting: 6.3   Smokeless tobacco: Never   Tobacco comments:    Previously smoked 2 ppd, down to 4 cigs/day, patient quit smoking in 2019  Vaping Use   Vaping status: Never Used  Substance Use Topics   Alcohol use: No   Drug use: No    Family History  Problem Relation Age of Onset   Colon cancer Mother        started as vaginal cancer   Heart attack Mother        4 MIs, started in her 88s, also vaginal cancer and colon cancer   Cancer Mother    Heart attack Father        Died suddenly age 26 - autopsy revealed heart attack her patient   Cancer Brother        Sinus cancer   Cervical cancer Maternal Aunt    Rectal cancer Neg Hx    Stomach cancer Neg Hx    Esophageal cancer Neg Hx    Scheduled Meds:  anastrozole   1 mg Oral Daily   folic acid   1 mg Oral Daily   lacosamide   200 mg Oral BID   phenytoin   130 mg Oral BID   potassium chloride   60 mEq Oral Daily   sodium chloride  flush  3 mL Intravenous Q12H   Warfarin - Pharmacist  Dosing Inpatient   Does not apply q1600   Continuous Infusions:  cefTRIAXone  (ROCEPHIN )  IV Stopped (02/17/24 1153)   [START ON 02/18/2024] vancomycin      PRN Meds:.acetaminophen  **OR** acetaminophen , albuterol , butalbital -acetaminophen -caffeine , LORazepam , ondansetron  **OR** ondansetron  (ZOFRAN ) IV, sodium chloride  flush Allergies  Allergen Reactions   Zonegran [Zonisamide] Other (See Comments)    Numbness and tingling over whole body   Chocolate Other (See Comments)    Triggers seizures   Codeine Nausea And Vomiting   Keppra  [Levetiracetam ] Other (See Comments)    Causes seizures   Lyrica  [Pregabalin ] Nausea And Vomiting   Olive Oil Rash    OBJECTIVE: Blood pressure (!) 112/52, pulse 92, temperature (!) 102.5 F (39.2 C), temperature source Oral,  resp. rate (!) 26, height 5' 7 (1.702 m), weight 59 kg, SpO2 92%.  Physical Exam HENT:     Head: Normocephalic and atraumatic.     Right Ear: Tympanic membrane normal.     Left Ear: Tympanic membrane normal.     Nose: Nose normal.     Mouth/Throat:     Mouth: Mucous membranes are moist.  Eyes:     Extraocular Movements: Extraocular movements intact.     Conjunctiva/sclera: Conjunctivae normal.     Pupils: Pupils are equal, round, and reactive to light.  Cardiovascular:     Rate and Rhythm: Normal rate and regular rhythm.     Heart sounds: No murmur heard.    No friction rub. No gallop.  Pulmonary:     Effort: Pulmonary effort is normal.     Breath sounds: Normal breath sounds.  Abdominal:     General: Abdomen is flat.     Palpations: Abdomen is soft.  Musculoskeletal:        General: Normal range of motion.  Skin:    General: Skin is warm and dry.  Neurological:     Mental Status: She is alert.     Comments: Headache and photophobia  Psychiatric:        Mood and Affect: Mood normal.     Lab Results Lab Results  Component Value Date   WBC 4.8 02/17/2024   HGB 9.8 (L) 02/17/2024   HCT 30.0 (L) 02/17/2024   MCV  102.0 (H) 02/17/2024   PLT 125 (L) 02/17/2024    Lab Results  Component Value Date   CREATININE 0.62 02/17/2024   BUN <5 (L) 02/17/2024   NA 137 02/17/2024   K 3.3 (L) 02/17/2024   CL 103 02/17/2024   CO2 24 02/17/2024    Lab Results  Component Value Date   ALT 27 02/16/2024   AST 41 02/16/2024   ALKPHOS 68 02/16/2024   BILITOT 1.1 02/16/2024       Loney Stank, MD [facility] 02/17/2024, 2:34 PM

## 2024-02-17 NOTE — Progress Notes (Signed)
 Pharmacy Antibiotic Note  [name] is a 68 y.o. female admitted on 02/15/2024 with headache/light sensitivity- concern for meningitis.  Pharmacy has been consulted for vancomycin  dosing. SCr currently stable at < 1 with CrCl ~ 65 ml/min.   Plan: Vancomycin  1000 mg x 1 then 750 mg q 12 hours per nomogram dosing Monitor renal function, possible LP, culture data  Also on ceftriaxone  2 gm IV Q 12 hours   Height: 5' 7 (170.2 cm) Weight: 59 kg (130 lb) IBW/kg (Calculated) : 61.6  Temp (24hrs), Avg:100.1 F (37.8 C), Min:98.4 F (36.9 C), Max:102.4 F (39.1 C)  Recent Labs  Lab 02/15/24 2248 02/15/24 2321 02/16/24 0043 02/16/24 0155 02/16/24 0633 02/17/24 0401  WBC 5.9  --   --   --   --  4.8  CREATININE  --   --  0.81  --   --  0.62  LATICACIDVEN  --  2.3*  --  0.5 0.8  --     Estimated Creatinine Clearance: 65.3 mL/min (by C-G formula based on SCr of 0.62 mg/dL).    Allergies  Allergen Reactions   Zonegran [Zonisamide] Other (See Comments)    Numbness and tingling over whole body   Chocolate Other (See Comments)    Triggers seizures   Codeine Nausea And Vomiting   Keppra  [Levetiracetam ] Other (See Comments)    Causes seizures   Lyrica  [Pregabalin ] Nausea And Vomiting   Olive Oil Rash      Thank you for allowing pharmacy to be a part of this patient's care.  Damien Quiet, PharmD, BCPS, BCIDP Infectious Diseases Clinical Pharmacist Phone: [phone] 02/17/2024 11:17 AM

## 2024-02-17 NOTE — Consult Note (Addendum)
 NEUROLOGY CONSULT NOTE   Date of service: February 17, 2024 Patient Name: [name] MRN:  [id number] DOB:  01/22/1956 Chief Complaint: Seizures Requesting Provider: Raenelle Donalda HERO, MD  History of Present Illness  [name] is a 68 y.o. female with hx of seizures following hemorrhagic stroke over 30 years ago, paroxysmal atrial fibrillation on warfarin, recurrent headaches, prior DVT, COPD, chronic back pain, and GERD who presented to the hospital on 02/15/2024 with acute encephalopathy with concerns for recurrent seizure.  She presented febrile, tachycardic in A-fib with RVR, tachypneic, hypotensive, but without hypoxia.  UA showed microscopic hematuria and rare bacteria but no nitrites, leukocytes, or pyuria.  No leukocytosis, no severe electrolyte abnormalities or AKI, full RVP negative, CT head/abdomen/pelvis without acute abnormalities.  Phenytoin  level was subtherapeutic at 8.3 on admission.  She has a reported history of recurrent seizures often associated with urinary tract infections so she was empirically treated on admission with ceftriaxone .  Prior to presentation she reports general adherence and good knowledge of her antiepileptic drugs (phenytoin  and Vimpat ).  She does report that about 2 or 3 days prior to presentation she resumed her Vimpat  after being out of it for roughly 1 week due to her pharmacy not sending this medication to her.  She says this happened in the past as well and she was hospitalized in a similar fashion.  She reports that her seizures are generally accompanied by a diffuse numb feeling, tonic-clonic movements generally, and urinary incontinence and her postictal periods are most notably characterized by her headaches.  Today she feels much better but is having a persistent headache that is consistent with her normal headaches however they usually do not last this long.  She otherwise feels cold but this is improved with proper coverage of her blankets.  She  has no other new or worsening concerns today.   ROS  Comprehensive ROS performed and pertinent positives documented in HPI   Past History   Past Medical History:  Diagnosis Date   Acute bronchitis 08/06/2022   Acute respiratory failure with hypoxia (HCC) 03/31/2015   Altered mental status    Asthma    Ataxia 08/26/2019   Atypical chest pain 02/17/2017   Cerebral hemorrhage (HCC) 1989   Chronic back pain    Closed fracture of distal end of left radius 07/17/2022   Closed fracture of fifth metatarsal bone 01/04/2019   Closed fracture of proximal phalanx of great toe 01/04/2019   Closed nondisplaced fracture of styloid process of left radius 08/06/2022   COPD (chronic obstructive pulmonary disease) (HCC)    no meds per daughter   COPD with exacerbation (HCC) 03/30/2015   Costochondritis 02/17/2017   COVID-19 virus infection 04/02/2020   DDD (degenerative disc disease) 06/12/2013   DVT (deep venous thrombosis) (HCC)    she's had several since 1990   GERD (gastroesophageal reflux disease)    no meds per daughter   Headache    usually around time when she's had a seizure   History of blood transfusion    when she was a baby   History of kidney stones    History of stomach ulcers    Hypoxia 01/18/2015   Lactic acidosis 04/02/2020   Neuropathy    Seizures (HCC)    because of her brain surgery  last one 07/28/22   Sepsis secondary to UTI (HCC) 01/17/2015   Stroke [facility]) 1990's   family denies residual on 11/09/2014   Tunnel vision    since  brain OR    Past Surgical History:  Procedure Laterality Date   BRAIN SURGERY  1989   craniotomy with hematoma evacuation   BREAST BIOPSY Left 12/22/2022   US  LT BREAST BX W LOC DEV 1ST LESION IMG BX SPEC US  GUIDE 12/22/2022 [facility]   BREAST BIOPSY  01/09/2023   MM LT RADIOACTIVE SEED LOC MAMMO GUIDE 01/09/2023 [facility]   BREAST LUMPECTOMY WITH RADIOACTIVE SEED AND SENTINEL LYMPH NODE BIOPSY Left 01/12/2023    Procedure: LEFT BREAST LUMPECTOMY WITH RADIOACTIVE SEED AND SENTINEL LYMPH NODE BIOPSY;  Surgeon: Belinda Cough, MD;  Location: [facility];  Service: General;  Laterality: Left;   CESAREAN SECTION  1979; 1987; 1989   HEMORRHOID SURGERY     IVC FILTER INSERTION N/A 10/12/2020   Procedure: IVC FILTER INSERTION;  Surgeon: Eliza Lonni RAMAN, MD;  Location: [facility] INVASIVE CV LAB;  Service: Cardiovascular;  Laterality: N/A;   OPEN REDUCTION INTERNAL FIXATION (ORIF) DISTAL RADIAL FRACTURE Left 08/25/2022   Procedure: OPEN REDUCTION INTERNAL FIXATION (ORIF) DISTAL RADIUS FRACTURE of nascent malunion;  Surgeon: Shari Easter, MD;  Location: [facility] OR;  Service: Orthopedics;  Laterality: Left;  regional with iv sedation   OPEN REDUCTION INTERNAL FIXATION (ORIF) DISTAL RADIAL FRACTURE Left 06/17/2023   Procedure: OPEN REDUCTION INTERNAL FIXATION (ORIF) DISTAL RADIUS FRACTURE;  Surgeon: Shari Easter, MD;  Location: [facility] OR;  Service: Orthopedics;  Laterality: Left;   TUBAL LIGATION  1990's    Family History: Family History  Problem Relation Age of Onset   Colon cancer Mother        started as vaginal cancer   Heart attack Mother        4 MIs, started in her 72s, also vaginal cancer and colon cancer   Cancer Mother    Heart attack Father        Died suddenly age 88 - autopsy revealed heart attack her patient   Cancer Brother        Sinus cancer   Cervical cancer Maternal Aunt    Rectal cancer Neg Hx    Stomach cancer Neg Hx    Esophageal cancer Neg Hx     Social History  reports that she quit smoking about 6 years ago. Her smoking use included cigarettes. She started smoking about 26 years ago. She has a 10 pack-year smoking history. She has never used smokeless tobacco. She reports that she does not drink alcohol and does not use drugs.  Allergies  Allergen Reactions   Zonegran [Zonisamide] Other (See Comments)    Numbness and tingling over whole body   Chocolate Other (See  Comments)    Triggers seizures   Codeine Nausea And Vomiting   Keppra  [Levetiracetam ] Other (See Comments)    Causes seizures   Lyrica  [Pregabalin ] Nausea And Vomiting   Olive Oil Rash    Medications   Current Facility-Administered Medications:    acetaminophen  (TYLENOL ) tablet 650 mg, 650 mg, Oral, Q6H PRN, 650 mg at 02/17/24 0455 **OR** acetaminophen  (TYLENOL ) suppository 650 mg, 650 mg, Rectal, Q6H PRN, Smith, Rondell A, MD   albuterol  (PROVENTIL ) (2.5 MG/3ML) 0.083% nebulizer solution 2.5 mg, 2.5 mg, Nebulization, Q6H PRN, Smith, Rondell A, MD   anastrozole  (ARIMIDEX ) tablet 1 mg, 1 mg, Oral, Daily, Smith, Rondell A, MD   butalbital -acetaminophen -caffeine  (FIORICET ) 50-325-40 MG per tablet 1 tablet, 1 tablet, Oral, Q6H PRN, Claudene Reeves A, MD, 1 tablet at 02/16/24 1520   cefTRIAXone  (ROCEPHIN ) 2 g in sodium chloride  0.9 % 100 mL  IVPB, 2 g, Intravenous, Daily, Debby Hitch A, MD   folic acid  (FOLVITE ) tablet 1 mg, 1 mg, Oral, Daily, Smith, Rondell A, MD, 1 mg at 02/16/24 1520   lacosamide  (VIMPAT ) tablet 200 mg, 200 mg, Oral, BID, Thomas, Sara-Maiz A, MD, 200 mg at 02/16/24 2240   LORazepam  (ATIVAN ) injection 2 mg, 2 mg, Intravenous, Q5 min PRN, Debby Hitch LABOR, MD   ondansetron  (ZOFRAN ) tablet 4 mg, 4 mg, Oral, Q6H PRN, 4 mg at 02/16/24 1520 **OR** ondansetron  (ZOFRAN ) injection 4 mg, 4 mg, Intravenous, Q6H PRN, Claudene, Rondell A, MD   phenytoin  (DILANTIN ) ER capsule 130 mg, 130 mg, Oral, BID, Debby Hitch A, MD, 130 mg at 02/16/24 2240   potassium chloride  (KLOR-CON ) packet 60 mEq, 60 mEq, Oral, Daily, Groce, Christopher F, PA-C, 60 mEq at 02/16/24 9081   sodium chloride  flush (NS) 0.9 % injection 10-40 mL, 10-40 mL, Intracatheter, PRN, Pollina, Lonni PARAS, MD   sodium chloride  flush (NS) 0.9 % injection 3 mL, 3 mL, Intravenous, Q12H, Smith, Rondell A, MD, 3 mL at 02/16/24 2249   Warfarin - Pharmacist Dosing Inpatient, , Does not apply, q1600, Claudene Reeves A,  MD  Vitals   Vitals:   02/16/24 2009 02/17/24 0040 02/17/24 0453 02/17/24 0700  BP: (!) 102/46 99/74 (!) 88/48 (!) 101/58  Pulse: 94 78 89   Resp: 20 (!) 25 (!) 25   Temp: (!) 101.5 F (38.6 C) 99.7 F (37.6 C) (!) 100.9 F (38.3 C) 98.4 F (36.9 C)  TempSrc: Oral Oral Other (Comment) Oral  SpO2: 94% 95% 93%   Weight:      Height:        Body mass index is 20.36 kg/m.   Physical Exam    Physical Exam Gen: A&Ox4, NAD HEENT: Atraumatic, normocephalic; oropharynx clear, tongue without atrophy or fasciculations. Mild nuchal tenderness to palpation and neck flexion. Resp: normal work of breathing CV: RRR, extremities appear well-perfused. Extrem: no cyanosis, clubbing, or edema.  Neuro: *MS: A&O x4. Follows multi-step commands.  *Speech: no dysarthria or aphasia, able to name and repeat. *CN:    I: Deferred   II,III: PERRLA, left homonymous hemianopsia, optic discs not visualized 2/2 pupillary constriction   III,IV,VI: EOMI w/o nystagmus, no ptosis   V: Sensation intact from V1 to V3 to LT   VII: Eyelid closure was full.  Smile symmetric.   VIII: Hearing intact to voice   IX,X: Voice normal, palate elevates symmetrically    XI: SCM/trap 5/5 bilat   XII: Tongue protrudes midline, no atrophy or fasciculations  *Motor:   Normal bulk.  No tremor, rigidity or bradykinesia. No pronator drift.   Strength: Dlt Bic Tri WE WrF FgS Gr HF KnF KnE PlF DoF    Left 5 5 5 5 5 5 5 5 5 5 5 5     Right 5 5 5 5 5 5 5 5 5 5 5 5    *Sensory: Intact to light touch, pinprick, temperature vibration throughout. Symmetric. Propioception intact bilat.  No double-simultaneous extinction.  *Coordination:  Finger-to-nose, heel-to-shin, rapid alternating motions were intact. *Reflexes:  2+ and symmetric throughout without clonus; toes down-going bilat *Gait: deferred  Labs/Imaging/Neurodiagnostic studies   CBC:  Recent Labs  Lab 2024-02-18 2248 02/17/24 0401  WBC 5.9 4.8  HGB 13.5 9.8*  HCT  40.1 30.0*  MCV 100.0 102.0*  PLT 112* 125*   Basic Metabolic Panel:  Lab Results  Component Value Date   NA 137 02/17/2024   K 3.3 (  L) 02/17/2024   CO2 24 02/17/2024   GLUCOSE 111 (H) 02/17/2024   BUN <5 (L) 02/17/2024   CREATININE 0.62 02/17/2024   CALCIUM 7.8 (L) 02/17/2024   GFRNONAA >60 02/17/2024   GFRAA >60 04/24/2020   Lipid Panel:  Lab Results  Component Value Date   LDLCALC 112 (H) 10/10/2020   HgbA1c:  Lab Results  Component Value Date   HGBA1C 5.0 08/02/2020   Urine Drug Screen:     Component Value Date/Time   LABOPIA NONE DETECTED 03/25/2023 0827   COCAINSCRNUR NONE DETECTED 03/25/2023 0827   LABBENZ NONE DETECTED 03/25/2023 0827   AMPHETMU NONE DETECTED 03/25/2023 0827   THCU NONE DETECTED 03/25/2023 0827   LABBARB NONE DETECTED 03/25/2023 0827    Alcohol Level     Component Value Date/Time   ETH <10 02/09/2021 1519   INR  Lab Results  Component Value Date   INR 5.3 (HH) 02/17/2024   APTT  Lab Results  Component Value Date   APTT 75 (H) 02/16/2024   AED levels:  Lab Results  Component Value Date   PHENYTOIN  8.3 (L) 02/16/2024    CT Head without contrast(Personally reviewed): 1. No acute intracranial abnormality. 2. 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  Neurodiagnostics rEEG:  02/16/2024- This study showed evidence of epileptogenicity and cortical dysfunction arising from right hemisphere, maximal right posterior quadrant. This EEG pattern is on the ictal-interictal continuum with potential for seizures.  Additionally there is mild to moderate diffuse encephalopathy.  No definite seizures were noted.   ASSESSMENT   JAYDAN CHRETIEN is a 68 y.o. female seizures following hemorrhagic stroke over 30 years ago, paroxysmal atrial fibrillation on warfarin, recurrent headaches, prior DVT, COPD, chronic back pain, and GERD who presented to the hospital on 02/15/2024 with acute encephalopathy with concerns for recurrent  seizure.   Most likely patient had breakthrough seizure in the setting of acute infection and 1 week off of Vimpat . Source of infection is unclear but she is having a persistent headache, repeat fever overnight, and mild neck pain without nuchal rigidity concerning for meningitis. She is on warfarin with INR of 5.3 today. Back to baseline mental status. Phenytoin  level was slightly subtherapeutic on admission although she reports full adherence up to the point of admission.  Home phenytoin  and Vimpat  have been resumed.  No further seizures since admission.  EEG showed right hemispheric epileptogenicity and cortical dysfunction most prominent in the right posterior quadrant consistent with ictal/interictal period with potential for seizures.  She is currently being treated with ceftriaxone  empirically and vancomycin  was added today. ID consulted for meningitis concern.   RECOMMENDATIONS  Continue phenytoin  130 mg twice daily and Vimpat  200 mg twice daily Continue ceftriaxone  and vancomycin  per ID Hold off on LP with INR 5.3 Recommend case management help with pharmacy issues to avoid any future lapses in AED doses. Pt is seizure free when adherent to current medications Seizure precautions Follows with [place] [facility], Dr. Georjean __________________________________________________________________________________________________________________________________________Seizure precautions: Per [place]  DMV statutes, patients with seizures are not allowed to drive until they have been seizure-free for six months and cleared by a physician    Use caution when using heavy equipment or power tools. Avoid working on ladders or at heights. Take showers instead of baths. Ensure the water temperature is not too high on the home water heater. Do not go swimming alone. Do not lock yourself in a room alone (i.e. bathroom). When caring for infants or small children, sit down when  holding, feeding, or changing  them to minimize risk of injury to the child in the event you have a seizure. Maintain good sleep hygiene. Avoid alcohol.    If patient has another seizure, call 911 and bring them back to the ED if: A.  The seizure lasts longer than 5 minutes.      B.  The patient doesn't wake shortly after the seizure or has new problems such as difficulty seeing, speaking or moving following the seizure C.  The patient was injured during the seizure D.  The patient has a temperature over 102 F (39C) E.  The patient vomited during the seizure and now is having trouble breathing    During the Seizure   - First, ensure adequate ventilation and place patients on the floor on their left side  Loosen clothing around the neck and ensure the airway is patent. If the patient is clenching the teeth, do not force the mouth open with any object as this can cause severe damage - Remove all items from the surrounding that can be hazardous. The patient may be oblivious to what's happening and may not even know what he or she is doing. If the patient is confused and wandering, either gently guide him/her away and block access to outside areas - Reassure the individual and be comforting - Call 911. In most cases, the seizure ends before EMS arrives. However, there are cases when seizures may last over 3 to 5 minutes. Or the individual may have developed breathing difficulties or severe injuries. If a pregnant patient or a person with diabetes develops a seizure, it is prudent to call an ambulance. - Finally, if the patient does not regain full consciousness, then call EMS. Most patients will remain confused for about 45 to 90 minutes after a seizure, so you must use judgment in calling for help. - Avoid restraints but make sure the patient is in a bed with padded side rails - Place the individual in a lateral position with the neck slightly flexed; this will help the saliva drain from the mouth and prevent the tongue from  falling backward - Remove all nearby furniture and other hazards from the area - Provide verbal assurance as the individual is regaining consciousness - Provide the patient with privacy if possible - Call for help and start treatment as ordered by the caregiver    After the Seizure (Postictal Stage)   After a seizure, most patients experience confusion, fatigue, muscle pain and/or a headache. Thus, one should permit the individual to sleep. For the next few days, reassurance is essential. Being calm and helping reorient the person is also of importance.   Most seizures are painless and end spontaneously. Seizures are not harmful to others but can lead to complications such as stress on the lungs, brain and the heart. Individuals with prior lung problems may develop labored breathing and respiratory distress.  Signed, Fairy Pool, DO Internal Medicine Resident, PGY-3 8:15 AM 02/17/2024   NEUROHOSPITALIST ADDENDUM Performed a face to face diagnostic evaluation.   I have reviewed the contents of history and physical exam as documented by PA/ARNP/Resident and agree with above documentation.  I have discussed and formulated the above plan as documented. Edits to the note have been made as needed.  Normalee Sistare, MD [facility] [phone]   If 7pm to 7am, please call on call as listed on AMION.

## 2024-02-17 NOTE — TOC Initial Note (Signed)
 Transition of Care [facility]) - Initial/Assessment Note    Patient Details  Name: [name] MRN: [id number] Date of Birth: Mar 24, 1956  [facility]) CM/SW Contact:    Marval Gell, RN Phone Number: 02/17/2024, 10:01 AM  Clinical Narrative:                 Patient admitted from home. Admitted with SIRS, cultures pending, IV Abx.  Last noted HH in [place] was [facility] in February, will follow up with family for home needs.     Expected Discharge Plan: Home w Home Health Services Barriers to Discharge: Continued Medical Work up   Patient Goals and CMS Choice            Expected Discharge Plan and Services   Discharge Planning Services: CM Consult   Living arrangements for the past 2 months: Single Family Home                                      Prior Living Arrangements/Services Living arrangements for the past 2 months: Single Family Home                     Activities of Daily Living   ADL Screening (condition at time of admission) Independently performs ADLs?: No Does the patient have a NEW difficulty with bathing/dressing/toileting/self-feeding that is expected to last >3 days?: No Does the patient have a NEW difficulty with getting in/out of bed, walking, or climbing stairs that is expected to last >3 days?: No Does the patient have a NEW difficulty with communication that is expected to last >3 days?: No Is the patient deaf or have difficulty hearing?: Yes Does the patient have difficulty seeing, even when wearing glasses/contacts?: Yes Does the patient have difficulty concentrating, remembering, or making decisions?: No  Permission Sought/Granted                  Emotional Assessment              Admission diagnosis:  UTI (urinary tract infection) [N39.0] Paroxysmal A-fib (HCC) [I48.0] Encephalopathy, unspecified type [G93.40] Patient Active Problem List   Diagnosis Date Noted   Acute metabolic encephalopathy  02/16/2024   Transient hypotension 02/16/2024   Supratherapeutic INR 02/16/2024   History of intracranial hemorrhage 02/16/2024   History of breast cancer 02/16/2024   Left sided distal radial plate fracture with dorsal displacement fragment 06/15/2023   Left ulnar distal medial physis and styloid fracture 06/15/2023   Metacarpal bone fracture 06/15/2023   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/2023   History of DVT bilateral lower extremities status post IVC filter 06/15/2023   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 on warfarin (HCC) 06/15/2023   Generalized anxiety disorder 06/15/2023   History of hemorrhagic stroke 06/15/2023   Macrocytic anemia 06/15/2023   Right foot pain 02/22/2023   Seizure (HCC) 02/10/2023   Seizures (HCC) 02/09/2023   Genetic testing 01/12/2023   Malignant neoplasm of upper-outer quadrant of left breast in female, estrogen receptor positive (HCC) 12/30/2022   Atelectasis 12/23/2022   Hyperlipidemia, unspecified 12/23/2022   Folate deficiency 08/06/2022   Low vitamin B12 level 08/06/2022   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   Left wrist pain 07/12/2022   Toe pain, left 02/11/2021   DVT (deep venous thrombosis) (HCC) 10/10/2020   Frequent falls 10/10/2020   Dysuria 10/10/2020  Paroxysmal atrial fibrillation (HCC) 10/10/2020   Fall at home, initial encounter 08/02/2020   Elevated Dilantin  level 08/26/2019   Pain in left foot 01/04/2019   Weakness of both lower extremities    Debility 05/25/2018   Chronic pain    Chronic back pain    Neuropathy    Orthostasis    Tobacco abuse    Left-sided weakness 03/19/2018   Localized swelling of lower extremity 02/17/2017   GERD (gastroesophageal reflux disease) 02/17/2017   Gait disturbance 11/02/2015   Right clavicle fracture 11/02/2015   Sepsis (HCC) 11/02/2015   Chest pain 03/31/2015   SOB (shortness of breath) 03/31/2015   Malnutrition of moderate degree  (HCC) 03/31/2015   COPD (chronic obstructive pulmonary disease) (HCC)    Unintentional weight loss 01/11/2015   Anxiety 10/22/2014   Asthma 05/15/2014   Protein calorie malnutrition (HCC) 05/15/2014   Arteriovenous malformation of brain 11/17/2013   Partial epilepsy with impairment of consciousness, intractable (HCC) 11/17/2013   History of hemorrhagic cerebrovascular accident (CVA) without residual deficits 11/17/2013   DDD (degenerative disc disease) 06/12/2013   Paresthesias 06/12/2013   Tremor 06/09/2013   Hypokalemia 01/23/2013   Leukopenia 12/14/2012   Thrombocytopenia- chronic 06/29/2012   Noncompliance with medication treatment due to underuse of medication 06/29/2012   Stress-personal 06/29/2012   Skull fracture (HCC) 02/28/2012   Dilantin  toxicity 02/28/2012   Seizure disorder (HCC) 02/28/2012   PCP:  Dyane Anthony RAMAN, FNP Pharmacy:   [facility] - [place], [place] - [address] [facility] [address] [place] [address] Phone: [phone] Fax: [phone]  [id number] [place], [place] - [address] AT [facility] [address] [facility] [address] [place] [address] Phone: [phone] Fax: [phone]  [facility] [place]  - [place] - [address] [place] [address] Phone: [phone] Fax: [phone]  [facility] #[id number] [place], [place] - [address] AT [facility] OF [facility] & [address] [place] [address] Phone: [phone] Fax: [phone]  [facility] - [place], [place] - [address] [place] [address] Phone: [phone] Fax: [phone]  [facility] - [place], [place] - [address] [facility] AT [facility] to Registered [facility] [address] [address] Phone: [phone] Fax:  [phone]  [facility] [address] [place] [address] Phone: [phone] Fax: [phone]  [facility] - [place], [place] - [address] [place] [address] Phone: [phone] Fax: [phone]     Social Drivers of Health (SDOH) Social History: SDOH Screenings   Food Insecurity: No Food Insecurity (02/16/2024)  Housing: Low Risk  (02/16/2024)  Transportation Needs: No Transportation Needs (02/16/2024)  Utilities: Not At Risk (02/16/2024)  Alcohol Screen: Low Risk  (03/11/2023)  Depression (PHQ2-9): Low Risk  (03/11/2023)  Financial Resource Strain: Low Risk  (12/31/2022)  Social Connections: Moderately Integrated (02/16/2024)  Tobacco Use: Medium Risk (12/31/2023)   SDOH Interventions:     Readmission Risk Interventions     No data to display

## 2024-02-18 ENCOUNTER — Other Ambulatory Visit: Payer: Self-pay

## 2024-02-18 ENCOUNTER — Telehealth: Payer: Self-pay | Admitting: Gastroenterology

## 2024-02-18 DIAGNOSIS — G9340 Encephalopathy, unspecified: Secondary | ICD-10-CM | POA: Diagnosis not present

## 2024-02-18 DIAGNOSIS — G039 Meningitis, unspecified: Secondary | ICD-10-CM | POA: Diagnosis not present

## 2024-02-18 DIAGNOSIS — Z853 Personal history of malignant neoplasm of breast: Secondary | ICD-10-CM | POA: Diagnosis not present

## 2024-02-18 DIAGNOSIS — A419 Sepsis, unspecified organism: Secondary | ICD-10-CM | POA: Diagnosis not present

## 2024-02-18 DIAGNOSIS — G40909 Epilepsy, unspecified, not intractable, without status epilepticus: Secondary | ICD-10-CM | POA: Diagnosis not present

## 2024-02-18 DIAGNOSIS — I480 Paroxysmal atrial fibrillation: Secondary | ICD-10-CM | POA: Diagnosis not present

## 2024-02-18 LAB — SARS CORONAVIRUS 2 BY RT PCR: SARS Coronavirus 2 by RT PCR: NEGATIVE

## 2024-02-18 LAB — COMPREHENSIVE METABOLIC PANEL WITH GFR
ALT: 20 U/L (ref 0–44)
AST: 23 U/L (ref 15–41)
Albumin: 2 g/dL — ABNORMAL LOW (ref 3.5–5.0)
Alkaline Phosphatase: 61 U/L (ref 38–126)
Anion gap: 10 (ref 5–15)
BUN: 5 mg/dL — ABNORMAL LOW (ref 8–23)
CO2: 24 mmol/L (ref 22–32)
Calcium: 8.1 mg/dL — ABNORMAL LOW (ref 8.9–10.3)
Chloride: 102 mmol/L (ref 98–111)
Creatinine, Ser: 0.61 mg/dL (ref 0.44–1.00)
GFR, Estimated: >60 mL/min (ref >60)
Glucose, Bld: 109 mg/dL — ABNORMAL HIGH (ref 70–99)
Potassium: 3.3 mmol/L — ABNORMAL LOW (ref 3.5–5.1)
Sodium: 136 mmol/L (ref 135–145)
Total Bilirubin: 0.7 mg/dL (ref 0.0–1.2)
Total Protein: 5.5 g/dL — ABNORMAL LOW (ref 6.5–8.1)

## 2024-02-18 LAB — MAGNESIUM: Magnesium: 1.8 mg/dL (ref 1.7–2.4)

## 2024-02-18 LAB — CBC
HCT: 31.2 % — ABNORMAL LOW (ref 36.0–46.0)
Hemoglobin: 10.4 g/dL — ABNORMAL LOW (ref 12.0–15.0)
MCH: 33.8 pg (ref 26.0–34.0)
MCHC: 33.3 g/dL (ref 30.0–36.0)
MCV: 101.3 fL — ABNORMAL HIGH (ref 80.0–100.0)
Platelets: 166 K/uL (ref 150–400)
RBC: 3.08 MIL/uL — ABNORMAL LOW (ref 3.87–5.11)
RDW: 12.2 % (ref 11.5–15.5)
WBC: 5.6 K/uL (ref 4.0–10.5)
nRBC: 0 % (ref 0.0–0.2)

## 2024-02-18 LAB — PROTIME-INR
INR: 4.3 (ref 0.8–1.2)
Prothrombin Time: 42.7 s — ABNORMAL HIGH (ref 11.4–15.2)

## 2024-02-18 MED ORDER — PHYTONADIONE 5 MG PO TABS: 5.0000 mg | ORAL_TABLET | Freq: Once | ORAL | Status: DC

## 2024-02-18 MED ORDER — VITAMIN K1 10 MG/ML IJ SOLN
5.0000 mg | Freq: Once | INTRAVENOUS | Status: AC
  Administered 2024-02-18: 5 mg via INTRAVENOUS
  Filled 2024-02-18 (×2): qty 0.5

## 2024-02-18 MED ORDER — LACTATED RINGERS IV BOLUS
1000.0000 mL | Freq: Once | INTRAVENOUS | Status: AC
  Administered 2024-02-18: 1000 mL via INTRAVENOUS

## 2024-02-18 MED ORDER — AMIODARONE LOAD VIA INFUSION
150.0000 mg | Freq: Once | INTRAVENOUS | Status: AC
  Administered 2024-02-18: 150 mg via INTRAVENOUS
  Filled 2024-02-18: qty 83.34

## 2024-02-18 MED ORDER — POTASSIUM CHLORIDE 20 MEQ PO PACK
60.0000 meq | PACK | Freq: Every day | ORAL | Status: DC
  Administered 2024-02-19: 60 meq via ORAL
  Filled 2024-02-18: qty 3

## 2024-02-18 MED ORDER — SODIUM CHLORIDE 0.9 % IV SOLN
2.0000 g | INTRAVENOUS | Status: DC
  Administered 2024-02-18 – 2024-02-21 (×18): 2 g via INTRAVENOUS
  Filled 2024-02-18 (×21): qty 2000

## 2024-02-18 MED ORDER — DEXTROSE 5 % IV SOLN
10.0000 mg/kg | Freq: Three times a day (TID) | INTRAVENOUS | Status: DC
  Administered 2024-02-18 – 2024-02-21 (×10): 590 mg via INTRAVENOUS
  Filled 2024-02-18 (×13): qty 11.8

## 2024-02-18 MED ORDER — SODIUM CHLORIDE 0.9% FLUSH: 10.0000 mL | INTRAVENOUS | Status: DC | PRN

## 2024-02-18 MED ORDER — POTASSIUM CHLORIDE CRYS ER 20 MEQ PO TBCR
60.0000 meq | EXTENDED_RELEASE_TABLET | Freq: Two times a day (BID) | ORAL | Status: AC
  Administered 2024-02-18 (×2): 60 meq via ORAL
  Filled 2024-02-18 (×2): qty 3

## 2024-02-18 MED ORDER — VITAMIN K1 10 MG/ML IJ SOLN
5.0000 mg | INTRAVENOUS | Status: DC
  Filled 2024-02-18: qty 0.5

## 2024-02-18 MED ORDER — AMIODARONE HCL IN DEXTROSE 360-4.14 MG/200ML-% IV SOLN
60.0000 mg/h | INTRAVENOUS | Status: DC
  Administered 2024-02-18 (×2): 60 mg/h via INTRAVENOUS
  Filled 2024-02-18 (×2): qty 200

## 2024-02-18 MED ORDER — CHLORHEXIDINE GLUCONATE CLOTH 2 % EX PADS
6.0000 | MEDICATED_PAD | Freq: Every day | CUTANEOUS | Status: DC
  Administered 2024-02-18 – 2024-02-23 (×6): 6 via TOPICAL

## 2024-02-18 MED ORDER — LACTATED RINGERS IV SOLN: INTRAVENOUS | Status: AC

## 2024-02-18 MED ORDER — SODIUM CHLORIDE 0.9% FLUSH
10.0000 mL | Freq: Two times a day (BID) | INTRAVENOUS | Status: DC
  Administered 2024-02-18 – 2024-02-23 (×9): 10 mL

## 2024-02-18 MED ORDER — AMIODARONE HCL IN DEXTROSE 360-4.14 MG/200ML-% IV SOLN
30.0000 mg/h | INTRAVENOUS | Status: DC
  Administered 2024-02-18 – 2024-02-20 (×5): 30 mg/h via INTRAVENOUS
  Filled 2024-02-18 (×4): qty 200

## 2024-02-18 MED ORDER — SODIUM CHLORIDE 0.9 % IV SOLN
1.0000 g | Freq: Four times a day (QID) | INTRAVENOUS | Status: DC
  Filled 2024-02-18 (×2): qty 1000

## 2024-02-18 NOTE — Telephone Encounter (Signed)
See note below, FYI

## 2024-02-18 NOTE — Progress Notes (Cosign Needed Addendum)
 NEUROLOGY CONSULT FOLLOW UP NOTE   Date of service: February 18, 2024 Patient Name: [name] MRN:  [id number] DOB:  15-Oct-1955  Interval Hx/subjective   Patient is feeling a little better this morning, seen at about 11 AM.  Her headache has not resolved but is improved.  She was placed on amiodarone  gtt. for A-fib with RVR this morning with associated dyspnea.  She denies any new or worsening symptoms including increased neck stiffness, nausea, vomiting, focal weakness. Vitals   Vitals:   02/17/24 2000 02/17/24 2200 02/18/24 0000 02/18/24 0646  BP: (!) 131/43  (!) 93/50 (!) 107/57  Pulse: (!) 105   (!) 124  Resp: (!) 31 (!) 25 (!) 29 (!) 24  Temp: (!) 102.8 F (39.3 C) 99.8 F (37.7 C) 98.5 F (36.9 C)   TempSrc: Oral Oral Oral   SpO2: 94%  95% 95%  Weight:      Height:         Body mass index is 20.36 kg/m.  Physical Exam   Gen: patient lying in bed, NAD CV: extremities appear well-perfused Resp: normal WOB  Neurologic exam MS: alert, oriented x4, follows commands Speech: no dysarthria, no aphasia CN: PERRL, left homonymous hemianopia, EOMI, sensation intact, face symmetric, hearing intact to voice Motor: 5/5 strength throughout Sensory: SILT Reflexes: 2+ symm with toes down bilat Coordination: FNF intact bilat Gait: deferred   Medications  Current Facility-Administered Medications:    acetaminophen  (TYLENOL ) tablet 650 mg, 650 mg, Oral, Q6H PRN, 650 mg at 02/18/24 0356 **OR** acetaminophen  (TYLENOL ) suppository 650 mg, 650 mg, Rectal, Q6H PRN, Claudene, Rondell A, MD   acyclovir  (ZOVIRAX ) 590 mg in dextrose  5 % 100 mL IVPB, 10 mg/kg, Intravenous, Q8H, Hershal Vito MATSU, RPH, Last Rate: 111.8 mL/hr at 02/18/24 0635, 590 mg at 02/18/24 0635   albuterol  (PROVENTIL ) (2.5 MG/3ML) 0.083% nebulizer solution 2.5 mg, 2.5 mg, Nebulization, Q6H PRN, Claudene, Rondell A, MD   amiodarone  (NEXTERONE  PREMIX) 360-4.14 MG/200ML-% (1.8 mg/mL) IV infusion, 60 mg/hr, Intravenous, Continuous,  Last Rate: 33.3 mL/hr at 02/18/24 1055, 60 mg/hr at 02/18/24 1055 **FOLLOWED BY** amiodarone  (NEXTERONE  PREMIX) 360-4.14 MG/200ML-% (1.8 mg/mL) IV infusion, 30 mg/hr, Intravenous, Continuous, Ghimire, Shanker M, MD   ampicillin  (OMNIPEN) 2 g in sodium chloride  0.9 % 100 mL IVPB, 2 g, Intravenous, Q4H, Hershal Vito MATSU, RPH   anastrozole  (ARIMIDEX ) tablet 1 mg, 1 mg, Oral, Daily, Smith, Rondell A, MD, 1 mg at 02/18/24 0935   butalbital -acetaminophen -caffeine  (FIORICET ) 50-325-40 MG per tablet 1 tablet, 1 tablet, Oral, Q6H PRN, Claudene Reeves A, MD, 1 tablet at 02/17/24 1103   cefTRIAXone  (ROCEPHIN ) 2 g in sodium chloride  0.9 % 100 mL IVPB, 2 g, Intravenous, Q12H, Dennise Kingsley, MD, Last Rate: 200 mL/hr at 02/17/24 2209, 2 g at 02/17/24 2209   folic acid  (FOLVITE ) tablet 1 mg, 1 mg, Oral, Daily, Claudene, Rondell A, MD, 1 mg at 02/18/24 9070   lacosamide  (VIMPAT ) tablet 200 mg, 200 mg, Oral, BID, Debby Hitch A, MD, 200 mg at 02/18/24 9070   lactated ringers  infusion, , Intravenous, Continuous, Sundil, Subrina, MD, Last Rate: 125 mL/hr at 02/18/24 0725, New Bag at 02/18/24 0725   LORazepam  (ATIVAN ) injection 2 mg, 2 mg, Intravenous, Q5 min PRN, Debby Hitch LABOR, MD   ondansetron  (ZOFRAN ) tablet 4 mg, 4 mg, Oral, Q6H PRN, 4 mg at 02/16/24 1520 **OR** ondansetron  (ZOFRAN ) injection 4 mg, 4 mg, Intravenous, Q6H PRN, Smith, Rondell A, MD   phenytoin  (DILANTIN ) ER capsule 130 mg, 130  mg, Oral, BID, Debby Hitch A, MD, 130 mg at 02/18/24 0934   [START ON 02/19/2024] potassium chloride  (KLOR-CON ) packet 60 mEq, 60 mEq, Oral, Daily, Ghimire, Donalda HERO, MD   potassium chloride  SA (KLOR-CON  M) CR tablet 60 mEq, 60 mEq, Oral, BID, Ghimire, Shanker M, MD, 60 mEq at 02/18/24 0929   sodium chloride  flush (NS) 0.9 % injection 10-40 mL, 10-40 mL, Intracatheter, PRN, Pollina, Lonni PARAS, MD   sodium chloride  flush (NS) 0.9 % injection 3 mL, 3 mL, Intravenous, Q12H, Smith, Rondell A, MD, 3 mL at 02/17/24 2219    vancomycin  (VANCOREADY) IVPB 750 mg/150 mL, 750 mg, Intravenous, Q12H, Bevely Damien RAMAN, RPH, Last Rate: 150 mL/hr at 02/17/24 2308, 750 mg at 02/17/24 2308   Warfarin - Pharmacist Dosing Inpatient, , Does not apply, q1600, Claudene Maximino LABOR, MD  Labs and Diagnostic Imaging   CBC:  Recent Labs  Lab 02/17/24 0401 02/18/24 0436  WBC 4.8 5.6  HGB 9.8* 10.4*  HCT 30.0* 31.2*  MCV 102.0* 101.3*  PLT 125* 166    Basic Metabolic Panel:  Lab Results  Component Value Date   NA 136 02/18/2024   K 3.3 (L) 02/18/2024   CO2 24 02/18/2024   GLUCOSE 109 (H) 02/18/2024   BUN 5 (L) 02/18/2024   CREATININE 0.61 02/18/2024   CALCIUM 8.1 (L) 02/18/2024   GFRNONAA >60 02/18/2024   GFRAA >60 04/24/2020   Lipid Panel:  Lab Results  Component Value Date   LDLCALC 112 (H) 10/10/2020   HgbA1c:  Lab Results  Component Value Date   HGBA1C 5.0 08/02/2020   Urine Drug Screen:     Component Value Date/Time   LABOPIA NONE DETECTED 02/17/2024 1244   COCAINSCRNUR NONE DETECTED 02/17/2024 1244   LABBENZ NONE DETECTED 02/17/2024 1244   AMPHETMU NONE DETECTED 02/17/2024 1244   THCU NONE DETECTED 02/17/2024 1244   LABBARB POSITIVE (A) 02/17/2024 1244    Alcohol Level     Component Value Date/Time   ETH <10 02/09/2021 1519   INR  Lab Results  Component Value Date   INR 4.3 (HH) 02/18/2024   APTT  Lab Results  Component Value Date   APTT 75 (H) 02/16/2024   AED levels:  Lab Results  Component Value Date   PHENYTOIN  8.3 (L) 02/16/2024    CT Head without contrast(Personally reviewed): 1. No acute intracranial abnormality. 2. 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  rEEG:  02/16/2024- This study showed evidence of epileptogenicity and cortical dysfunction arising from right hemisphere, maximal right posterior quadrant. This EEG pattern is on the ictal-interictal continuum with potential for seizures.  Additionally there is mild to moderate diffuse encephalopathy.   No definite seizures were noted.   Assessment   [name] is a 67 y.o. 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  She presented febrile, tachycardic in A-fib with RVR, tachypneic, hypotensive, but without hypoxia.  UA showed microscopic hematuria and rare bacteria but no nitrites, leukocytes, or pyuria.  No leukocytosis, no severe electrolyte abnormalities or AKI, full RVP negative, CT head/abdomen/pelvis without acute abnormalities.  Phenytoin  level was subtherapeutic at 8.3 on admission.  She has a reported history of recurrent seizures often associated with urinary tract infections  so she was empirically treated on admission with ceftriaxone .  Since admission vancomycin  was added for recurrent fevers on 02/17/2024 and ampicillin  and acyclovir  were added on 02/18/2024.  She has been unable to undergo lumbar puncture due to supratherapeutic INR, 4.3 today.  This morning she went back into A-fib with RVR was placed on amiodarone  drip.  During my assessment today about 3 hours later she is in normal sinus rhythm and is feeling much better.  Cardiology has been consulted.  Overall her story is still concerning for meningitis with recurrent fevers and persistent headache without other identifiable source of infection.  Unfortunately her INR is still above 4 and so we cannot proceed with lumbar puncture.  She is now on ceftriaxone  2 g daily, vancomycin , ampicillin , and acyclovir  for meningitis coverage.  ID is following.   Recommendations  Continue phenytoin  130 mg twice daily and Vimpat  200 mg twice daily Continue ceftriaxone , vancomycin , ampicillin , and acyclovir  per ID Hold off on  LP with INR 4.3, agree with vitamin K  administration We will attempt a bedside lumbar puncture when the INR is less than 1.5, please contact us  at that time and we will follow peripherally until then   Recommend case management help with pharmacy issues to avoid any future lapses in AED doses. Pt is seizure free when adherent to current medications Seizure precautions Follows with [place] neurology, Dr. Georjean _____________________________________________________________________  Seizure precautions: Per [place]  DMV statutes, patients with seizures are not allowed to drive until they have been seizure-free for six months and cleared by a physician    Use caution when using heavy equipment or power tools. Avoid working on ladders or at heights. Take showers instead of baths. Ensure the water temperature is not too high on the home water heater. Do not go swimming alone. Do not lock yourself in a room alone (i.e. bathroom). When caring for infants or small children, sit down when holding, feeding, or changing them to minimize risk of injury to the child in the event you have a seizure. Maintain good sleep hygiene. Avoid alcohol.    If patient has another seizure, call 911 and bring them back to the ED if: A.  The seizure lasts longer than 5 minutes.      B.  The patient doesn't wake shortly after the seizure or has new problems such as difficulty seeing, speaking or moving following the seizure C.  The patient was injured during the seizure D.  The patient has a temperature over 102 F (39C) E.  The patient vomited during the seizure and now is having trouble breathing    During the Seizure   - First, ensure adequate ventilation and place patients on the floor on their left side  Loosen clothing around the neck and ensure the airway is patent. If the patient is clenching the teeth, do not force the mouth open with any object as this can cause severe damage - Remove all items from the  surrounding that can be hazardous. The patient may be oblivious to what's happening and may not even know what he or she is doing. If the patient is confused and wandering, either gently guide him/her away and block access to outside areas - Reassure the individual and be comforting - Call 911. In most cases, the seizure ends before EMS arrives. However, there are cases when seizures may last over 3 to 5 minutes. Or the individual may have developed breathing difficulties or severe injuries. If a pregnant patient or  a person with diabetes develops a seizure, it is prudent to call an ambulance. - Finally, if the patient does not regain full consciousness, then call EMS. Most patients will remain confused for about 45 to 90 minutes after a seizure, so you must use judgment in calling for help. - Avoid restraints but make sure the patient is in a bed with padded side rails - Place the individual in a lateral position with the neck slightly flexed; this will help the saliva drain from the mouth and prevent the tongue from falling backward - Remove all nearby furniture and other hazards from the area - Provide verbal assurance as the individual is regaining consciousness - Provide the patient with privacy if possible - Call for help and start treatment as ordered by the caregiver    After the Seizure (Postictal Stage)   After a seizure, most patients experience confusion, fatigue, muscle pain and/or a headache. Thus, one should permit the individual to sleep. For the next few days, reassurance is essential. Being calm and helping reorient the person is also of importance.   Most seizures are painless and end spontaneously. Seizures are not harmful to others but can lead to complications such as stress on the lungs, brain and the heart. Individuals with prior lung problems may develop labored breathing and respiratory distress.   Signed, Fairy Pool, DO Internal Medicine Resident, PGY-3 11:05 AM  02/18/2024   Attending Neurohospitalist Addendum Patient seen and examined with APP/Resident. Agree with the history and physical as documented above. Agree with the plan as documented, which I helped formulate. I have edited the note above to reflect my full findings and recommendations. I have independently reviewed the chart, obtained history, review of systems and examined the patient.I have personally reviewed pertinent head/neck/spine imaging (CT/MRI). Please feel free to call with any questions.  -- Elida Ross, MD [facility] [phone]  If 7pm- 7am, please page neurology on call as listed in AMION.

## 2024-02-18 NOTE — Progress Notes (Signed)
 PHARMACY - ANTICOAGULATION CONSULT NOTE  Pharmacy Consult for warfarin Indication: atrial fibrillation and DVT  Allergies  Allergen Reactions   Zonegran [Zonisamide] Other (See Comments)    Numbness and tingling over whole body   Chocolate Other (See Comments)    Triggers seizures   Codeine Nausea And Vomiting   Keppra  [Levetiracetam ] Other (See Comments)    Causes seizures   Lyrica  [Pregabalin ] Nausea And Vomiting   Olive Oil Rash    Patient Measurements: Height: 5' 7 (170.2 cm) Weight: 59 kg (130 lb) IBW/kg (Calculated) : 61.6 HEPARIN  DW (KG): 59  Vital Signs: Temp: 98.5 F (36.9 C) (07/17 0000) Temp Source: Oral (07/17 0000) BP: 107/57 (07/17 0646) Pulse Rate: 124 (07/17 0646)  Labs: Recent Labs    02/15/24 2248 02/16/24 0043 02/16/24 0424 02/17/24 0401 02/18/24 0436  HGB 13.5  --   --  9.8* 10.4*  HCT 40.1  --   --  30.0* 31.2*  PLT 112*  --   --  125* 166  APTT  --   --  75*  --   --   LABPROT  --   --  51.3* 50.9* 42.7*  INR  --   --  5.4* 5.3* 4.3*  CREATININE  --  0.81  --  0.62 0.61    Estimated Creatinine Clearance: 65.3 mL/min (by C-G formula based on SCr of 0.61 mg/dL).   Medical History: Past Medical History:  Diagnosis Date   Acute bronchitis 08/06/2022   Acute respiratory failure with hypoxia (HCC) 03/31/2015   Altered mental status    Asthma    Ataxia 08/26/2019   Atypical chest pain 02/17/2017   Cerebral hemorrhage (HCC) 1989   Chronic back pain    Closed fracture of distal end of left radius 07/17/2022   Closed fracture of fifth metatarsal bone 01/04/2019   Closed fracture of proximal phalanx of great toe 01/04/2019   Closed nondisplaced fracture of styloid process of left radius 08/06/2022   COPD (chronic obstructive pulmonary disease) (HCC)    no meds per daughter   COPD with exacerbation (HCC) 03/30/2015   Costochondritis 02/17/2017   COVID-19 virus infection 04/02/2020   DDD (degenerative disc disease) 06/12/2013   DVT  (deep venous thrombosis) (HCC)    she's had several since 1990   GERD (gastroesophageal reflux disease)    no meds per daughter   Headache    usually around time when she's had a seizure   History of blood transfusion    when she was a baby   History of kidney stones    History of stomach ulcers    Hypoxia 01/18/2015   Lactic acidosis 04/02/2020   Neuropathy    Seizures (HCC)    because of her brain surgery  last one 07/28/22   Sepsis secondary to UTI (HCC) 01/17/2015   Stroke [facility]) 1990's   family denies residual on 11/09/2014   Tunnel vision    since brain OR    Assessment: 11 yof presented to the ED with AMS. She is chronically anticoagulated on warfarin for VTE hx and Afib. Last dose of warfarin was 7/14 at 9 pm. Her INR is supratherapeutic at 4.3 but trending down from 7/15. No overt bleeding noted.  LP delayed due supratherapeutic INR.   Goal of Therapy:  INR 2-3 Monitor platelets by anticoagulation protocol: Yes   Plan:  Hold warfarin today  Check INR daily while on warfarin  Continue to monitor H&H and platelets   Thank you for  allowing pharmacy to be a part of this patient's care.    Prentice JINNY Favors 02/18/2024,9:02 AM

## 2024-02-18 NOTE — Progress Notes (Signed)
 Peripherally Inserted Central Catheter Placement  The IV Nurse has discussed with the patient and/or persons authorized to consent for the patient, the purpose of this procedure and the potential benefits and risks involved with this procedure.  The benefits include less needle sticks, lab draws from the catheter, and the patient may be discharged home with the catheter. Risks include, but not limited to, infection, bleeding, blood clot (thrombus formation), and puncture of an artery; nerve damage and irregular heartbeat and possibility to perform a PICC exchange if needed/ordered by physician.  Alternatives to this procedure were also discussed.  Bard Power PICC patient education guide, fact sheet on infection prevention and patient information card has been provided to patient /or left at bedside.    PICC Placement Documentation  PICC Double Lumen 02/18/24 Right Brachial 41 cm 0 cm (Active)  Indication for Insertion or Continuance of Line Limited venous access - need for IV therapy >5 days (PICC only) 02/18/24 1300  Exposed Catheter (cm) 0 cm 02/18/24 1300  Site Assessment Clean, Dry, Intact 02/18/24 1300  Lumen #1 Status Saline locked;Flushed;Blood return noted 02/18/24 1300  Lumen #2 Status Saline locked;Flushed;Blood return noted 02/18/24 1300  Dressing Type Transparent;Securing device 02/18/24 1300  Dressing Status Antimicrobial disc/dressing in place;Clean, Dry, Intact 02/18/24 1300  Line Care Connections checked and tightened 02/18/24 1300  Line Adjustment (NICU/IV Team Only) No 02/18/24 1300  Dressing Intervention New dressing;Adhesive placed at insertion site (IV team only) 02/18/24 1300  Dressing Change Due 02/25/24 02/18/24 1300       Ethyl Priestly Renee 02/18/2024, 1:28 PM

## 2024-02-18 NOTE — Progress Notes (Signed)
 9259 Report given, Dr. Raenelle here and ordered Amiodarone  after stat EKG showed afib RVR 170s, RN called back and requested bolus Amio dose and 150 mg bolus ordered.  Report given to oncoming RN, 20g L Hand IV removed and pt will need additional IV access, Acyclovir  dose IVP complete.

## 2024-02-18 NOTE — Progress Notes (Signed)
 Pharmacy Antibiotic Note  [name] is a 68 y.o. female admitted on 02/15/2024 with r/o meningitis, encephalitis.  Pharmacy has been consulted for Acyclovir  dosing.  Noted pt currently on Rocephin /Vanc/Ampicillin .  Plan: Acyclovir  590mg  (10mg /kg) IV q8h LR at 125 ml/hr (per MD) Change ampicillin  to 2gm IV q4h (meningitis dose) Will f/u renal function, micro data, and pt's clinical condition  Height: 5' 7 (170.2 cm) Weight: 59 kg (130 lb) IBW/kg (Calculated) : 61.6  Temp (24hrs), Avg:100.4 F (38 C), Min:98.4 F (36.9 C), Max:102.8 F (39.3 C)  Recent Labs  Lab 02/15/24 2248 02/15/24 2321 02/16/24 0043 02/16/24 0155 02/16/24 0633 02/17/24 0401  WBC 5.9  --   --   --   --  4.8  CREATININE  --   --  0.81  --   --  0.62  LATICACIDVEN  --  2.3*  --  0.5 0.8  --     Estimated Creatinine Clearance: 65.3 mL/min (by C-G formula based on SCr of 0.62 mg/dL).    Allergies  Allergen Reactions   Zonegran [Zonisamide] Other (See Comments)    Numbness and tingling over whole body   Chocolate Other (See Comments)    Triggers seizures   Codeine Nausea And Vomiting   Keppra  [Levetiracetam ] Other (See Comments)    Causes seizures   Lyrica  [Pregabalin ] Nausea And Vomiting   Olive Oil Rash    Antimicrobials this admission: 7/15 Rocephin  >>  7/16 Vanc >>  7/17 Amp >> 7/17 Acyclovir  >>  Microbiology results: 7/15 BCx: 7/16 BCx:  7/15 MRSA PCR: negative  Thank you for allowing pharmacy to be a part of this patient's care.  Vito Ralph, PharmD, BCPS Please see amion for complete clinical pharmacist phone list 02/18/2024 4:56 AM

## 2024-02-18 NOTE — Plan of Care (Signed)
 Patient continued to spike fever with temperature 102 F.  Reported shivering.  Currently on broad-spectrum antibiotic coverage with IV vancomycin  and ceftriaxone .  Based on ID recommendation adding ampicillin  and acyclovir  to treat empirically for meningitis.  MRI unremarkable for any acute intracranial abnormality.  Due to elevated INR 5.4 initially lumbar puncture was unable to obtained.

## 2024-02-18 NOTE — Telephone Encounter (Signed)
 Procedure:Colonoscopy/Endoscopy Procedure date: 02/25/24 Procedure location: [facility] Arrival Time: 8:45 am Spoke with the patient Y/N:  Talked with patient's daughter, pt is in the hospital and she stated if pt is not doing better she would call back and cancel/reschedule appt or ? If it could be done while pt is in the hospital.  Any prep concerns? ___  Has the patient obtained the prep from the pharmacy ? ___ Do you have a care partner and transportation: ___ Any additional concerns? ___

## 2024-02-18 NOTE — Plan of Care (Signed)

## 2024-02-18 NOTE — Progress Notes (Signed)
 Notified by telemetry that patient's heart rate going into the 170's, sustaining in the 140's. BP 107/57. EKG performed that showed Afib RVR. Dr. Raenelle notified.

## 2024-02-18 NOTE — Plan of Care (Signed)
?  Problem: Education: ?Goal: Knowledge of General Education information will improve ?Description: Including pain rating scale, medication(s)/side effects and non-pharmacologic comfort measures ?Outcome: Progressing ?  ?Problem: Clinical Measurements: ?Goal: Ability to maintain clinical measurements within normal limits will improve ?Outcome: Progressing ?  ?Problem: Nutrition: ?Goal: Adequate nutrition will be maintained ?Outcome: Progressing ?  ?Problem: Coping: ?Goal: Level of anxiety will decrease ?Outcome: Progressing ?  ?Problem: Elimination: ?Goal: Will not experience complications related to bowel motility ?Outcome: Progressing ?  ?

## 2024-02-18 NOTE — Progress Notes (Addendum)
 PROGRESS NOTE        PATIENT DETAILS Name: [name] Age: 68 y.o. Sex: female Date of Birth: [birth year] Admit Date: 02/15/2024 Admitting Physician Camila DELENA Ned, MD ERE:Yjbzd, Anthony RAMAN, FNP  Brief Summary: Patient is a 68 y.o.  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  Per H&P-family concerned that she has had possible breakthrough seizures at home.  Subsequently admitted to the hospitalist service.  Significant events: 7/15>> admit to [facility]. 7/17>> A-fib RVR with borderline blood pressure-IV amiodarone  started.  Cardiology consulted.  INR> 4, vitamin K  ordered.  Significant studies: 7/15>> CT head: No acute intracranial abnormality. 7/15>> CT abdomen/pelvis: No acute intracranial abnormality. 7/15>> EEG: No definite seizures-with evidence of epileptogenicity/cortical dysfunction from right hemisphere. 7/16>> MRI brain: No acute intracranial abnormality.  Significant microbiology data: 7/14>> COVID/influenza/RSV PCR: Negative 7/14>> respiratory virus panel: Negative 7/15>> blood culture: No growth. 7/16>> blood culture: No growth  Procedures: None  Consults: ID Neurology.  Subjective: Febrile overnight-complains of headache this morning.  Objective: Vitals: Blood pressure (!) 107/47, pulse 88, temperature 99.7 F (37.6 C), temperature source Oral, resp. rate (!) 21, height 5' 7 (1.702 m), weight 59 kg, SpO2 94%.   Exam: Awake/alert Chest: Clear to auscultation CVS: S1-S2-tachycardic Extremities: No edema Nonfocal  Pertinent Labs/Radiology:    Latest Ref Rng & Units 02/18/2024    4:36 AM 02/17/2024    4:01 AM 02/15/2024   10:48 PM  CBC  WBC 4.0 - 10.5 K/uL 5.6  4.8  5.9   Hemoglobin 12.0 - 15.0 g/dL 89.5  9.8  86.4   Hematocrit 36.0 - 46.0 % 31.2  30.0  40.1   Platelets 150 - 400 K/uL 166  125  112     Lab  Results  Component Value Date   NA 136 02/18/2024   K 3.3 (L) 02/18/2024   CL 102 02/18/2024   CO2 24 02/18/2024     Assessment/Plan: Sepsis Suspicion for meningitis/encephalitis-has no other source of infection apparent-she has a headache this morning. Continues to have sepsis physiology-although no leukocytosis-she is still febrile-and is now in A-fib RVR. On broad-spectrum antimicrobial therapy with vancomycin /Rocephin /acyclovir  and ampicillin . INR still> 4, will give 1 dose of vitamin K -and have discussed with ID/neurology to see if we can plan for LP tomorrow morning.  Addendum Spoke with daughter-she apparently has similar symptoms no fever/headache-interested for COVID.  Will repeat COVID PCR.  Possible breakthrough seizure Seizure threshold probably lowered due to sepsis physiology-and missing several doses of AEDs. EEG results as above-likely reflecting prior ICH Remains on phenytoin /Vimpat  Neurology following.  Acute metabolic encephalopathy Probably due to combination of sepsis physiology/possible breakthrough seizures Encephalopathy has more or less resolved-she is much more awake and alert today compared to the past several days. EEG as above MRI brain negative for any significant abnormalities.  PAF with RVR Borderline hypotensive earlier this morning with RVR-have started amiodarone  infusion After discussion with cardiology-plan to reverse Coumadin /INR with vitamin K  as LP is needed to rule out meningitis at this point.  History of DVT Coumadin  on hold since admission-INR still supratherapeutic Plan to reverse INR today with vitamin K -as she is still spiking fever-and needs LP to rule out meningitis.  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  Mild extrahepatic biliary ductal dilatation seen incidentally on  CT abdomen This is a known history of-has been worked up in the past-MRI abdomen in January 2025-no choledocholithiasis-LFTs are stable-she does not  have abdominal pain.  Suspect this is an incidental and chronic finding.  History of breast cancer Continue anastrozole   Thrombocytopenia Mild Chronic issue Follow-up CBC periodically  Hypokalemia Replete/recheck  Code status:   Code Status: Full Code   DVT Prophylaxis:supra-therapeutic INR   Family Communication:Daughter [phone] updated 7/17   Disposition Plan: Status is: Inpatient Remains inpatient appropriate because: Severity of illness   Planned Discharge Destination:Home health   Diet: Diet Order             Diet regular Room service appropriate? Yes; Fluid consistency: Thin  Diet effective now                     Antimicrobial agents: Anti-infectives (From admission, onward)    Start     Dose/Rate Route Frequency Ordered Stop   02/18/24 0600  ampicillin  (OMNIPEN) 1 g in sodium chloride  0.9 % 100 mL IVPB  Status:  Discontinued        1 g 300 mL/hr over 20 Minutes Intravenous Every 6 hours 02/18/24 0430 02/18/24 0502   02/18/24 0600  ampicillin  (OMNIPEN) 2 g in sodium chloride  0.9 % 100 mL IVPB        2 g 300 mL/hr over 20 Minutes Intravenous Every 4 hours 02/18/24 0502     02/18/24 0545  acyclovir  (ZOVIRAX ) 590 mg in dextrose  5 % 100 mL IVPB        10 mg/kg  59 kg 111.8 mL/hr over 60 Minutes Intravenous Every 8 hours 02/18/24 0458     02/18/24 0030  vancomycin  (VANCOREADY) IVPB 750 mg/150 mL        750 mg 150 mL/hr over 60 Minutes Intravenous Every 12 hours 02/17/24 1116     02/17/24 1230  vancomycin  (VANCOCIN ) IVPB 1000 mg/200 mL premix        1,000 mg 200 mL/hr over 60 Minutes Intravenous  Once 02/17/24 1116 02/17/24 1312   02/17/24 1200  cefTRIAXone  (ROCEPHIN ) 2 g in sodium chloride  0.9 % 100 mL IVPB        2 g 200 mL/hr over 30 Minutes Intravenous Every 12 hours 02/17/24 1112     02/17/24 1000  cefTRIAXone  (ROCEPHIN ) 2 g in sodium chloride  0.9 % 100 mL IVPB  Status:  Discontinued        2 g 200 mL/hr over 30 Minutes Intravenous  Daily 02/16/24 0548 02/17/24 1112   02/16/24 0545  cefTRIAXone  (ROCEPHIN ) 2 g in sodium chloride  0.9 % 100 mL IVPB        2 g 200 mL/hr over 30 Minutes Intravenous  Once 02/16/24 0540 02/16/24 0630        MEDICATIONS: Scheduled Meds:  anastrozole   1 mg Oral Daily   folic acid   1 mg Oral Daily   lacosamide   200 mg Oral BID   phenytoin   130 mg Oral BID   [START ON 02/19/2024] potassium chloride   60 mEq Oral Daily   potassium chloride   60 mEq Oral BID   sodium chloride  flush  3 mL Intravenous Q12H   Warfarin - Pharmacist Dosing Inpatient   Does not apply q1600   Continuous Infusions:  acyclovir  (ZOVIRAX ) 590 mg in dextrose  5 % 100 mL IVPB 590 mg (02/18/24 0635)   amiodarone  60 mg/hr (02/18/24 1055)   Followed by   amiodarone      ampicillin  (OMNIPEN) IV  cefTRIAXone  (ROCEPHIN )  IV 2 g (02/17/24 2209)   lactated ringers  125 mL/hr at 02/18/24 0725   vancomycin  750 mg (02/17/24 2308)   PRN Meds:.acetaminophen  **OR** acetaminophen , albuterol , butalbital -acetaminophen -caffeine , LORazepam , ondansetron  **OR** ondansetron  (ZOFRAN ) IV, sodium chloride  flush   I have personally reviewed following labs and imaging studies  LABORATORY DATA: CBC: Recent Labs  Lab 02/15/24 2248 02/17/24 0401 02/18/24 0436  WBC 5.9 4.8 5.6  HGB 13.5 9.8* 10.4*  HCT 40.1 30.0* 31.2*  MCV 100.0 102.0* 101.3*  PLT 112* 125* 166    Basic Metabolic Panel: Recent Labs  Lab 02/16/24 0043 02/16/24 0630 02/17/24 0401 02/18/24 0436  NA 134*  --  137 136  K 3.1*  --  3.3* 3.3*  CL 102  --  103 102  CO2 20*  --  24 24  GLUCOSE 144*  --  111* 109*  BUN 15  --  <5* 5*  CREATININE 0.81  --  0.62 0.61  CALCIUM 8.3*  --  7.8* 8.1*  MG  --  2.1  --  1.8    GFR: Estimated Creatinine Clearance: 65.3 mL/min (by C-G formula based on SCr of 0.61 mg/dL).  Liver Function Tests: Recent Labs  Lab 02/16/24 0043 02/18/24 0436  AST 41 23  ALT 27 20  ALKPHOS 68 61  BILITOT 1.1 0.7  PROT 6.1* 5.5*   ALBUMIN 2.7* 2.0*   No results for input(s): LIPASE, AMYLASE in the last 168 hours. Recent Labs  Lab 02/17/24 0401  AMMONIA 26    Coagulation Profile: Recent Labs  Lab 02/16/24 0424 02/17/24 0401 02/18/24 0436  INR 5.4* 5.3* 4.3*    Cardiac Enzymes: No results for input(s): CKTOTAL, CKMB, CKMBINDEX, TROPONINI in the last 168 hours.  BNP (last 3 results) No results for input(s): PROBNP in the last 8760 hours.  Lipid Profile: No results for input(s): CHOL, HDL, LDLCALC, TRIG, CHOLHDL, LDLDIRECT in the last 72 hours.  Thyroid  Function Tests: Recent Labs    02/17/24 0401  TSH 0.957    Anemia Panel: No results for input(s): VITAMINB12, FOLATE, FERRITIN, TIBC, IRON, RETICCTPCT in the last 72 hours.  Urine analysis:    Component Value Date/Time   COLORURINE AMBER (A) 02/15/2024 2346   APPEARANCEUR HAZY (A) 02/15/2024 2346   LABSPEC 1.021 02/15/2024 2346   PHURINE 5.0 02/15/2024 2346   GLUCOSEU NEGATIVE 02/15/2024 2346   HGBUR SMALL (A) 02/15/2024 2346   BILIRUBINUR SMALL (A) 02/15/2024 2346   KETONESUR NEGATIVE 02/15/2024 2346   PROTEINUR 100 (A) 02/15/2024 2346   UROBILINOGEN 0.2 01/17/2015 2350   NITRITE NEGATIVE 02/15/2024 2346   LEUKOCYTESUR NEGATIVE 02/15/2024 2346    Sepsis Labs: Lactic Acid, Venous    Component Value Date/Time   LATICACIDVEN 0.8 02/16/2024 9366    MICROBIOLOGY: Recent Results (from the past 240 hours)  Resp panel by RT-PCR (RSV, Flu A&B, Covid) Anterior Nasal Swab     Status: None   Collection Time: 02/15/24 11:46 PM   Specimen: Anterior Nasal Swab  Result Value Ref Range Status   SARS Coronavirus 2 by RT PCR NEGATIVE NEGATIVE Final   Influenza A by PCR NEGATIVE NEGATIVE Final   Influenza B by PCR NEGATIVE NEGATIVE Final    Comment: (NOTE) The Xpert Xpress SARS-CoV-2/FLU/RSV plus assay is intended as an aid in the diagnosis of influenza from Nasopharyngeal swab specimens and should not be  used as a sole basis for treatment. Nasal washings and aspirates are unacceptable for Xpert Xpress SARS-CoV-2/FLU/RSV testing.  Fact Sheet for  Patients: BloggerCourse.com  Fact Sheet for Healthcare Providers: SeriousBroker.it  This test is not yet approved or cleared by the [place]  FDA and has been authorized for detection and/or diagnosis of SARS-CoV-2 by FDA under an Emergency Use Authorization (EUA). This EUA will remain in effect (meaning this test can be used) for the duration of the COVID-19 declaration under Section 564(b)(1) of the Act, 21 U.S.C. section 360bbb-3(b)(1), unless the authorization is terminated or revoked.     Resp Syncytial Virus by PCR NEGATIVE NEGATIVE Final    Comment: (NOTE) Fact Sheet for Patients: BloggerCourse.com  Fact Sheet for Healthcare Providers: SeriousBroker.it  This test is not yet approved or cleared by the [place]  FDA and has been authorized for detection and/or diagnosis of SARS-CoV-2 by FDA under an Emergency Use Authorization (EUA). This EUA will remain in effect (meaning this test can be used) for the duration of the COVID-19 declaration under Section 564(b)(1) of the Act, 21 U.S.C. section 360bbb-3(b)(1), unless the authorization is terminated or revoked.  Performed at [facility], [address] [place], [place] [address]   Respiratory (~20 pathogens) panel by PCR     Status: None   Collection Time: 02/15/24 11:46 PM   Specimen: Nasopharyngeal Swab; Respiratory  Result Value Ref Range Status   Adenovirus NOT DETECTED NOT DETECTED Final   Coronavirus 229E NOT DETECTED NOT DETECTED Final    Comment: (NOTE) The Coronavirus on the Respiratory Panel, DOES NOT test for the novel  Coronavirus (2019 nCoV)    Coronavirus HKU1 NOT DETECTED NOT DETECTED Final   Coronavirus NL63 NOT DETECTED NOT DETECTED Final    Coronavirus OC43 NOT DETECTED NOT DETECTED Final   Metapneumovirus NOT DETECTED NOT DETECTED Final   Rhinovirus / Enterovirus NOT DETECTED NOT DETECTED Final   Influenza A NOT DETECTED NOT DETECTED Final   Influenza B NOT DETECTED NOT DETECTED Final   Parainfluenza Virus 1 NOT DETECTED NOT DETECTED Final   Parainfluenza Virus 2 NOT DETECTED NOT DETECTED Final   Parainfluenza Virus 3 NOT DETECTED NOT DETECTED Final   Parainfluenza Virus 4 NOT DETECTED NOT DETECTED Final   Respiratory Syncytial Virus NOT DETECTED NOT DETECTED Final   Bordetella pertussis NOT DETECTED NOT DETECTED Final   Bordetella Parapertussis NOT DETECTED NOT DETECTED Final   Chlamydophila pneumoniae NOT DETECTED NOT DETECTED Final   Mycoplasma pneumoniae NOT DETECTED NOT DETECTED Final    Comment: Performed at [facility], [address], [place], [place] [address]  MRSA Next Gen by PCR, Nasal     Status: None   Collection Time: 02/16/24  8:40 AM   Specimen: Urine, Clean Catch; Nasal Swab  Result Value Ref Range Status   MRSA by PCR Next Gen NOT DETECTED NOT DETECTED Final    Comment: (NOTE) The GeneXpert MRSA Assay (FDA approved for NASAL specimens only), is one component of a comprehensive MRSA colonization surveillance program. It is not intended to diagnose MRSA infection nor to guide or monitor treatment for MRSA infections. Test performance is not FDA approved in patients less than 64 years old. Performed at [facility], [address], [place], [place] [address]   Blood culture (routine x 2)     Status: None (Preliminary result)   Collection Time: 02/16/24  9:33 PM   Specimen: BLOOD  Result Value Ref Range Status   Specimen Description BLOOD SITE NOT SPECIFIED  Final   Special Requests   Final    BOTTLES DRAWN AEROBIC AND ANAEROBIC  Blood Culture adequate volume   Culture   Final    NO GROWTH 2 DAYS Performed at [facility], [address] [place], [place] [address]    Report  Status PENDING  Incomplete  Blood culture (routine x 2)     Status: None (Preliminary result)   Collection Time: 02/16/24  9:34 PM   Specimen: BLOOD  Result Value Ref Range Status   Specimen Description BLOOD SITE NOT SPECIFIED  Final   Special Requests   Final    BOTTLES DRAWN AEROBIC AND ANAEROBIC Blood Culture adequate volume   Culture   Final    NO GROWTH 2 DAYS Performed at [facility], [address] [place], [place] [address]    Report Status PENDING  Incomplete  Culture, blood (Routine X 2) w Reflex to ID Panel     Status: None (Preliminary result)   Collection Time: 02/17/24 12:10 PM   Specimen: BLOOD RIGHT ARM  Result Value Ref Range Status   Specimen Description BLOOD RIGHT ARM  Final   Special Requests   Final    BOTTLES DRAWN AEROBIC ONLY Blood Culture adequate volume   Culture   Final    NO GROWTH < 24 HOURS Performed at [facility], [address], [place], [place] [address]    Report Status PENDING  Incomplete  Culture, blood (Routine X 2) w Reflex to ID Panel     Status: None (Preliminary result)   Collection Time: 02/17/24 12:11 PM   Specimen: BLOOD LEFT HAND  Result Value Ref Range Status   Specimen Description BLOOD LEFT HAND  Final   Special Requests   Final    BOTTLES DRAWN AEROBIC ONLY Blood Culture results may not be optimal due to an inadequate volume of blood received in culture bottles   Culture   Final    NO GROWTH < 24 HOURS Performed at [facility], [address], [place], [place] [address]    Report Status PENDING  Incomplete    RADIOLOGY STUDIES/RESULTS: US  EKG SITE RITE Result Date: 02/18/2024 If Site Rite image not attached, placement could not be confirmed due to current cardiac rhythm.  MR BRAIN W WO CONTRAST Result Date: 02/18/2024 CLINICAL DATA:  Mental status change, unknown cause EXAM: MRI HEAD WITHOUT AND WITH CONTRAST TECHNIQUE: Multiplanar, multiecho pulse sequences of the brain and surrounding structures  were obtained without and with intravenous contrast. CONTRAST:  6mL GADAVIST  GADOBUTROL  1 MMOL/ML IV SOLN COMPARISON:  CT head 02/16/2024. FINDINGS: Brain: Chronic encephalomalacia in the right occipital, parietal and posterior right frontal lobe. No evidence of acute infarct, acute hemorrhage, mass lesion or midline shift. No hydrocephalus. No abnormal enhancement. Vascular: Major arterial flow voids are maintained at the skull base. Skull and upper cervical spine: Normal marrow signal. Sinuses/Orbits: Clear sinuses.  No acute orbital findings. IMPRESSION: 1. No evidence of acute intracranial abnormality. 2. Chronic encephalomalacia in the right occipital, parietal and posterior right frontal lobe Electronically Signed   By: Gilmore GORMAN Molt M.D.   On: 02/18/2024 00:21     LOS: 2 days   Donalda Applebaum, MD  [facility]    To contact the attending provider between 7A-7P or the covering provider during after hours 7P-7A, please log into the web site [email or web address] and access using universal [place] password for that web site. If you do not have the password, please call the hospital operator.  02/18/2024, 1:22 PM
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
## 2024-02-18 NOTE — Progress Notes (Signed)
 Brief Note Afib RVR this am-BP soft INR still >4 Will start Amio gtt Cards will be consulted Full note to follow.

## 2024-02-19 DIAGNOSIS — G9340 Encephalopathy, unspecified: Secondary | ICD-10-CM | POA: Diagnosis not present

## 2024-02-19 DIAGNOSIS — G039 Meningitis, unspecified: Secondary | ICD-10-CM | POA: Diagnosis not present

## 2024-02-19 DIAGNOSIS — A419 Sepsis, unspecified organism: Secondary | ICD-10-CM | POA: Diagnosis not present

## 2024-02-19 DIAGNOSIS — G40909 Epilepsy, unspecified, not intractable, without status epilepticus: Secondary | ICD-10-CM | POA: Diagnosis not present

## 2024-02-19 DIAGNOSIS — R6520 Severe sepsis without septic shock: Secondary | ICD-10-CM | POA: Diagnosis not present

## 2024-02-19 LAB — COMPREHENSIVE METABOLIC PANEL WITH GFR
ALT: 19 U/L (ref 0–44)
AST: 21 U/L (ref 15–41)
Albumin: 1.9 g/dL — ABNORMAL LOW (ref 3.5–5.0)
Alkaline Phosphatase: 61 U/L (ref 38–126)
Anion gap: 8 (ref 5–15)
BUN: <5 mg/dL — ABNORMAL LOW (ref 8–23)
CO2: 25 mmol/L (ref 22–32)
Calcium: 8.1 mg/dL — ABNORMAL LOW (ref 8.9–10.3)
Chloride: 106 mmol/L (ref 98–111)
Creatinine, Ser: 0.82 mg/dL (ref 0.44–1.00)
GFR, Estimated: >60 mL/min (ref >60)
Glucose, Bld: 121 mg/dL — ABNORMAL HIGH (ref 70–99)
Potassium: 3.7 mmol/L (ref 3.5–5.1)
Sodium: 139 mmol/L (ref 135–145)
Total Bilirubin: 0.4 mg/dL (ref 0.0–1.2)
Total Protein: 5.3 g/dL — ABNORMAL LOW (ref 6.5–8.1)

## 2024-02-19 LAB — CBC
HCT: 29.7 % — ABNORMAL LOW (ref 36.0–46.0)
Hemoglobin: 9.9 g/dL — ABNORMAL LOW (ref 12.0–15.0)
MCH: 34.1 pg — ABNORMAL HIGH (ref 26.0–34.0)
MCHC: 33.3 g/dL (ref 30.0–36.0)
MCV: 102.4 fL — ABNORMAL HIGH (ref 80.0–100.0)
Platelets: 178 K/uL (ref 150–400)
RBC: 2.9 MIL/uL — ABNORMAL LOW (ref 3.87–5.11)
RDW: 12.3 % (ref 11.5–15.5)
WBC: 5.2 K/uL (ref 4.0–10.5)
nRBC: 0 % (ref 0.0–0.2)

## 2024-02-19 LAB — PROTIME-INR
INR: 1.4 — ABNORMAL HIGH (ref 0.8–1.2)
Prothrombin Time: 17.8 s — ABNORMAL HIGH (ref 11.4–15.2)

## 2024-02-19 MED ORDER — FENTANYL CITRATE PF 50 MCG/ML IJ SOSY: 50.0000 ug | PREFILLED_SYRINGE | Freq: Once | INTRAMUSCULAR | Status: DC

## 2024-02-19 MED ORDER — LORAZEPAM 2 MG/ML IJ SOLN: 1.0000 mg | Freq: Once | INTRAMUSCULAR | Status: DC

## 2024-02-19 MED ORDER — LORAZEPAM 1 MG PO TABS
1.0000 mg | ORAL_TABLET | Freq: Once | ORAL | Status: AC
  Administered 2024-02-19: 1 mg via ORAL
  Filled 2024-02-19: qty 1

## 2024-02-19 MED ORDER — DOXYCYCLINE HYCLATE 100 MG PO TABS
100.0000 mg | ORAL_TABLET | Freq: Two times a day (BID) | ORAL | Status: DC
  Administered 2024-02-19 – 2024-02-21 (×5): 100 mg via ORAL
  Filled 2024-02-19 (×5): qty 1

## 2024-02-19 MED ORDER — FENTANYL CITRATE PF 50 MCG/ML IJ SOSY
50.0000 ug | PREFILLED_SYRINGE | INTRAMUSCULAR | Status: AC
  Administered 2024-02-19: 50 ug via INTRAVENOUS
  Filled 2024-02-19: qty 1

## 2024-02-19 NOTE — Progress Notes (Signed)
 PROGRESS NOTE        PATIENT DETAILS Name: [name] Age: 68 y.o. Sex: female Date of Birth: 16-Feb-1956 Admit Date: 02/15/2024 Admitting Physician Camila DELENA Ned, MD ERE:Yjbzd, Anthony RAMAN, FNP  Brief Summary: Patient is a 68 y.o.  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  Per H&P-family concerned that she has had possible breakthrough seizures at home.  Subsequently admitted to the hospitalist service.  Significant events: 7/15>> admit to [facility]. 7/17>> A-fib RVR with borderline blood pressure-IV amiodarone  started.  Cardiology consulted.  INR> 4, vitamin K  ordered.  Significant studies: 7/15>> CT head: No acute intracranial abnormality. 7/15>> CT abdomen/pelvis: No acute intracranial abnormality. 7/15>> EEG: No definite seizures-with evidence of epileptogenicity/cortical dysfunction from right hemisphere. 7/16>> MRI brain: No acute intracranial abnormality.  Significant microbiology data: 7/14>> COVID/influenza/RSV PCR: Negative 7/14>> respiratory virus panel: Negative 7/15>> blood culture: No growth. 7/16>> blood culture: No growth 7/17>> COVID PCR: Negative.  Procedures: None  Consults: ID Neurology.  Subjective: Febrile last night-continues to have some headaches.  Awake/alert.  Objective: Vitals: Blood pressure 112/69, pulse 74, temperature 98.9 F (37.2 C), temperature source Oral, resp. rate 18, height 5' 7 (1.702 m), weight 59 kg, SpO2 98%.   Exam: Awake/alert No meningeal signs Chest: Clear to auscultation CVS: S1-S2 regular Abdomen: Soft nontender nondistended Extremities: No edema Neurology: Nonfocal.  Pertinent Labs/Radiology:    Latest Ref Rng & Units 02/19/2024    3:46 AM 02/18/2024    4:36 AM 02/17/2024    4:01 AM  CBC  WBC 4.0 - 10.5 K/uL 5.2  5.6  4.8   Hemoglobin 12.0 - 15.0 g/dL 9.9  89.5  9.8    Hematocrit 36.0 - 46.0 % 29.7  31.2  30.0   Platelets 150 - 400 K/uL 178  166  125     Lab Results  Component Value Date   NA 139 02/19/2024   K 3.7 02/19/2024   CL 106 02/19/2024   CO2 25 02/19/2024     Assessment/Plan: Sepsis Suspicion for meningitis/encephalitis-as no other source apparent. INR finally down to 1.4-after Coumadin  held on admission and 5 mg of vitamin K  given 7/17-LP to be done later today In the interim-continue with broad-spectrum coverage with vancomycin /ceftriaxone /ampicillin /acyclovir .    Note-daughter was recently diagnosed with COVID-at family's request-repeat COVID PCR on 7/7 was negative.  Patient has no signs of URI.  Possible breakthrough seizure Seizure threshold probably lowered due to sepsis physiology-and missing several doses of AEDs. EEG results as above-likely reflecting prior ICH Remains on phenytoin /Vimpat  Neurology following.  Acute metabolic encephalopathy Probably due to combination of sepsis physiology/possible breakthrough seizures Encephalopathy has more or less resolved-she is much more awake and alert today compared to the past several days. EEG as above MRI brain negative for any significant abnormalities.  PAF with RVR Occurred on 7/17-required amiodarone  infusion-subsequently quickly flipped back into sinus rhythm Maintaining sinus sinus rhythm Resume Coumadin  after LP Per cardiology-no need to continue amiodarone  after 24 hours of infusion.  History of DVT Given vitamin K -in order to perform LP INR now down to 1.4 Resume Coumadin  post LP.  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  Mild extrahepatic biliary ductal dilatation seen incidentally on CT abdomen This is a known history of-has been worked up in the past-MRI abdomen  in January 2025-no choledocholithiasis-LFTs are stable-she does not have abdominal pain.  Suspect this is an incidental and chronic finding.  History of breast cancer Continue  anastrozole   Thrombocytopenia Mild Chronic issue Follow-up CBC periodically  Hypokalemia Replete/recheck  Code status:   Code Status: Full Code   DVT Prophylaxis:SCD's   Family Communication:Daughter [phone] updated 7/18   Disposition Plan: Status is: Inpatient Remains inpatient appropriate because: Severity of illness   Planned Discharge Destination:Home health   Diet: Diet Order             Diet regular Room service appropriate? Yes; Fluid consistency: Thin  Diet effective now                     Antimicrobial agents: Anti-infectives (From admission, onward)    Start     Dose/Rate Route Frequency Ordered Stop   02/18/24 0600  ampicillin  (OMNIPEN) 1 g in sodium chloride  0.9 % 100 mL IVPB  Status:  Discontinued        1 g 300 mL/hr over 20 Minutes Intravenous Every 6 hours 02/18/24 0430 02/18/24 0502   02/18/24 0600  ampicillin  (OMNIPEN) 2 g in sodium chloride  0.9 % 100 mL IVPB        2 g 300 mL/hr over 20 Minutes Intravenous Every 4 hours 02/18/24 0502     02/18/24 0545  acyclovir  (ZOVIRAX ) 590 mg in dextrose  5 % 100 mL IVPB        10 mg/kg  59 kg 111.8 mL/hr over 60 Minutes Intravenous Every 8 hours 02/18/24 0458     02/18/24 0030  vancomycin  (VANCOREADY) IVPB 750 mg/150 mL        750 mg 150 mL/hr over 60 Minutes Intravenous Every 12 hours 02/17/24 1116     02/17/24 1230  vancomycin  (VANCOCIN ) IVPB 1000 mg/200 mL premix        1,000 mg 200 mL/hr over 60 Minutes Intravenous  Once 02/17/24 1116 02/17/24 1312   02/17/24 1200  cefTRIAXone  (ROCEPHIN ) 2 g in sodium chloride  0.9 % 100 mL IVPB        2 g 200 mL/hr over 30 Minutes Intravenous Every 12 hours 02/17/24 1112     02/17/24 1000  cefTRIAXone  (ROCEPHIN ) 2 g in sodium chloride  0.9 % 100 mL IVPB  Status:  Discontinued        2 g 200 mL/hr over 30 Minutes Intravenous Daily 02/16/24 0548 02/17/24 1112   02/16/24 0545  cefTRIAXone  (ROCEPHIN ) 2 g in sodium chloride  0.9 % 100 mL IVPB        2  g 200 mL/hr over 30 Minutes Intravenous  Once 02/16/24 0540 02/16/24 0630        MEDICATIONS: Scheduled Meds:  anastrozole   1 mg Oral Daily   Chlorhexidine  Gluconate Cloth  6 each Topical Daily   folic acid   1 mg Oral Daily   lacosamide   200 mg Oral BID   phenytoin   130 mg Oral BID   potassium chloride   60 mEq Oral Daily   sodium chloride  flush  10-40 mL Intracatheter Q12H   sodium chloride  flush  3 mL Intravenous Q12H   Warfarin - Pharmacist Dosing Inpatient   Does not apply q1600   Continuous Infusions:  acyclovir  (ZOVIRAX ) 590 mg in dextrose  5 % 100 mL IVPB 590 mg (02/19/24 0642)   amiodarone  30 mg/hr (02/19/24 0647)   ampicillin  (OMNIPEN) IV 2 g (02/19/24 1005)   cefTRIAXone  (ROCEPHIN )  IV 2 g (02/19/24 0924)   vancomycin  750  mg (02/19/24 0026)   PRN Meds:.acetaminophen  **OR** acetaminophen , albuterol , butalbital -acetaminophen -caffeine , LORazepam , ondansetron  **OR** ondansetron  (ZOFRAN ) IV, sodium chloride  flush, sodium chloride  flush   I have personally reviewed following labs and imaging studies  LABORATORY DATA: CBC: Recent Labs  Lab 02/15/24 2248 02/17/24 0401 02/18/24 0436 02/19/24 0346  WBC 5.9 4.8 5.6 5.2  HGB 13.5 9.8* 10.4* 9.9*  HCT 40.1 30.0* 31.2* 29.7*  MCV 100.0 102.0* 101.3* 102.4*  PLT 112* 125* 166 178    Basic Metabolic Panel: Recent Labs  Lab 02/16/24 0043 02/16/24 0630 02/17/24 0401 02/18/24 0436 02/19/24 0346  NA 134*  --  137 136 139  K 3.1*  --  3.3* 3.3* 3.7  CL 102  --  103 102 106  CO2 20*  --  24 24 25   GLUCOSE 144*  --  111* 109* 121*  BUN 15  --  <5* 5* <5*  CREATININE 0.81  --  0.62 0.61 0.82  CALCIUM 8.3*  --  7.8* 8.1* 8.1*  MG  --  2.1  --  1.8  --     GFR: Estimated Creatinine Clearance: 63.7 mL/min (by C-G formula based on SCr of 0.82 mg/dL).  Liver Function Tests: Recent Labs  Lab 02/16/24 0043 02/18/24 0436 02/19/24 0346  AST 41 23 21  ALT 27 20 19   ALKPHOS 68 61 61  BILITOT 1.1 0.7 0.4  PROT 6.1*  5.5* 5.3*  ALBUMIN 2.7* 2.0* 1.9*   No results for input(s): LIPASE, AMYLASE in the last 168 hours. Recent Labs  Lab 02/17/24 0401  AMMONIA 26    Coagulation Profile: Recent Labs  Lab 02/16/24 0424 02/17/24 0401 02/18/24 0436 02/19/24 0346  INR 5.4* 5.3* 4.3* 1.4*    Cardiac Enzymes: No results for input(s): CKTOTAL, CKMB, CKMBINDEX, TROPONINI in the last 168 hours.  BNP (last 3 results) No results for input(s): PROBNP in the last 8760 hours.  Lipid Profile: No results for input(s): CHOL, HDL, LDLCALC, TRIG, CHOLHDL, LDLDIRECT in the last 72 hours.  Thyroid  Function Tests: Recent Labs    02/17/24 0401  TSH 0.957    Anemia Panel: No results for input(s): VITAMINB12, FOLATE, FERRITIN, TIBC, IRON, RETICCTPCT in the last 72 hours.  Urine analysis:    Component Value Date/Time   COLORURINE AMBER (A) 02/15/2024 2346   APPEARANCEUR HAZY (A) 02/15/2024 2346   LABSPEC 1.021 02/15/2024 2346   PHURINE 5.0 02/15/2024 2346   GLUCOSEU NEGATIVE 02/15/2024 2346   HGBUR SMALL (A) 02/15/2024 2346   BILIRUBINUR SMALL (A) 02/15/2024 2346   KETONESUR NEGATIVE 02/15/2024 2346   PROTEINUR 100 (A) 02/15/2024 2346   UROBILINOGEN 0.2 01/17/2015 2350   NITRITE NEGATIVE 02/15/2024 2346   LEUKOCYTESUR NEGATIVE 02/15/2024 2346    Sepsis Labs: Lactic Acid, Venous    Component Value Date/Time   LATICACIDVEN 0.8 02/16/2024 9366    MICROBIOLOGY: Recent Results (from the past 240 hours)  Resp panel by RT-PCR (RSV, Flu A&B, Covid) Anterior Nasal Swab     Status: None   Collection Time: 02/15/24 11:46 PM   Specimen: Anterior Nasal Swab  Result Value Ref Range Status   SARS Coronavirus 2 by RT PCR NEGATIVE NEGATIVE Final   Influenza A by PCR NEGATIVE NEGATIVE Final   Influenza B by PCR NEGATIVE NEGATIVE Final    Comment: (NOTE) The Xpert Xpress SARS-CoV-2/FLU/RSV plus assay is intended as an aid in the diagnosis of influenza from  Nasopharyngeal swab specimens and should not be used as a sole basis for treatment. Nasal washings  and aspirates are unacceptable for Xpert Xpress SARS-CoV-2/FLU/RSV testing.  Fact Sheet for Patients: BloggerCourse.com  Fact Sheet for Healthcare Providers: SeriousBroker.it  This test is not yet approved or cleared by the [place]  FDA and has been authorized for detection and/or diagnosis of SARS-CoV-2 by FDA under an Emergency Use Authorization (EUA). This EUA will remain in effect (meaning this test can be used) for the duration of the COVID-19 declaration under Section 564(b)(1) of the Act, 21 U.S.C. section 360bbb-3(b)(1), unless the authorization is terminated or revoked.     Resp Syncytial Virus by PCR NEGATIVE NEGATIVE Final    Comment: (NOTE) Fact Sheet for Patients: BloggerCourse.com  Fact Sheet for Healthcare Providers: SeriousBroker.it  This test is not yet approved or cleared by the [place]  FDA and has been authorized for detection and/or diagnosis of SARS-CoV-2 by FDA under an Emergency Use Authorization (EUA). This EUA will remain in effect (meaning this test can be used) for the duration of the COVID-19 declaration under Section 564(b)(1) of the Act, 21 U.S.C. section 360bbb-3(b)(1), unless the authorization is terminated or revoked.  Performed at [facility], [address] [place], [place] [address]   Respiratory (~20 pathogens) panel by PCR     Status: None   Collection Time: 02/15/24 11:46 PM   Specimen: Nasopharyngeal Swab; Respiratory  Result Value Ref Range Status   Adenovirus NOT DETECTED NOT DETECTED Final   Coronavirus 229E NOT DETECTED NOT DETECTED Final    Comment: (NOTE) The Coronavirus on the Respiratory Panel, DOES NOT test for the novel  Coronavirus (2019 nCoV)    Coronavirus HKU1 NOT DETECTED NOT DETECTED Final    Coronavirus NL63 NOT DETECTED NOT DETECTED Final   Coronavirus OC43 NOT DETECTED NOT DETECTED Final   Metapneumovirus NOT DETECTED NOT DETECTED Final   Rhinovirus / Enterovirus NOT DETECTED NOT DETECTED Final   Influenza A NOT DETECTED NOT DETECTED Final   Influenza B NOT DETECTED NOT DETECTED Final   Parainfluenza Virus 1 NOT DETECTED NOT DETECTED Final   Parainfluenza Virus 2 NOT DETECTED NOT DETECTED Final   Parainfluenza Virus 3 NOT DETECTED NOT DETECTED Final   Parainfluenza Virus 4 NOT DETECTED NOT DETECTED Final   Respiratory Syncytial Virus NOT DETECTED NOT DETECTED Final   Bordetella pertussis NOT DETECTED NOT DETECTED Final   Bordetella Parapertussis NOT DETECTED NOT DETECTED Final   Chlamydophila pneumoniae NOT DETECTED NOT DETECTED Final   Mycoplasma pneumoniae NOT DETECTED NOT DETECTED Final    Comment: Performed at [facility], [address] [place], [place] [address]  MRSA Next Gen by PCR, Nasal     Status: None   Collection Time: 02/16/24  8:40 AM   Specimen: Urine, Clean Catch; Nasal Swab  Result Value Ref Range Status   MRSA by PCR Next Gen NOT DETECTED NOT DETECTED Final    Comment: (NOTE) The GeneXpert MRSA Assay (FDA approved for NASAL specimens only), is one component of a comprehensive MRSA colonization surveillance program. It is not intended to diagnose MRSA infection nor to guide or monitor treatment for MRSA infections. Test performance is not FDA approved in patients less than 36 years old. Performed at [facility], [address], [place], [place] [address]   Blood culture (routine x 2)     Status: None (Preliminary result)   Collection Time: 02/16/24  9:33 PM   Specimen: BLOOD  Result Value Ref Range Status   Specimen Description BLOOD SITE NOT SPECIFIED  Final  Special Requests   Final    BOTTLES DRAWN AEROBIC AND ANAEROBIC Blood Culture adequate volume   Culture   Final    NO GROWTH 3 DAYS Performed at [facility],  [address], [place], [place] [address]    Report Status PENDING  Incomplete  Blood culture (routine x 2)     Status: None (Preliminary result)   Collection Time: 02/16/24  9:34 PM   Specimen: BLOOD  Result Value Ref Range Status   Specimen Description BLOOD SITE NOT SPECIFIED  Final   Special Requests   Final    BOTTLES DRAWN AEROBIC AND ANAEROBIC Blood Culture adequate volume   Culture   Final    NO GROWTH 3 DAYS Performed at [facility], [address] [place], [place] [address]    Report Status PENDING  Incomplete  Culture, blood (Routine X 2) w Reflex to ID Panel     Status: None (Preliminary result)   Collection Time: 02/17/24 12:10 PM   Specimen: BLOOD RIGHT ARM  Result Value Ref Range Status   Specimen Description BLOOD RIGHT ARM  Final   Special Requests   Final    BOTTLES DRAWN AEROBIC ONLY Blood Culture adequate volume   Culture   Final    NO GROWTH 2 DAYS Performed at [facility], [address] [place], [place] [address]    Report Status PENDING  Incomplete  Culture, blood (Routine X 2) w Reflex to ID Panel     Status: None (Preliminary result)   Collection Time: 02/17/24 12:11 PM   Specimen: BLOOD LEFT HAND  Result Value Ref Range Status   Specimen Description BLOOD LEFT HAND  Final   Special Requests   Final    BOTTLES DRAWN AEROBIC ONLY Blood Culture results may not be optimal due to an inadequate volume of blood received in culture bottles   Culture   Final    NO GROWTH 2 DAYS Performed at [facility], [address], [place], [place] [address]    Report Status PENDING  Incomplete  SARS Coronavirus 2 by RT PCR (hospital order, performed in [facility] hospital lab) *cepheid single result test* Anterior Nasal Swab     Status: None   Collection Time: 02/18/24  2:10 PM   Specimen: Anterior Nasal Swab  Result Value Ref Range Status   SARS Coronavirus 2 by RT PCR NEGATIVE NEGATIVE Final    Comment: Performed at [facility], [address], [place], [place] [address]    RADIOLOGY STUDIES/RESULTS: US  EKG SITE RITE Result Date: 02/18/2024 If Site Rite image not attached, placement could not be confirmed due to current cardiac rhythm.  MR BRAIN W WO CONTRAST Result Date: 02/18/2024 CLINICAL DATA:  Mental status change, unknown cause EXAM: MRI HEAD WITHOUT AND WITH CONTRAST TECHNIQUE: Multiplanar, multiecho pulse sequences of the brain and surrounding structures were obtained without and with intravenous contrast. CONTRAST:  6mL GADAVIST  GADOBUTROL  1 MMOL/ML IV SOLN COMPARISON:  CT head 02/16/2024. FINDINGS: Brain: Chronic encephalomalacia in the right occipital, parietal and posterior right frontal lobe. No evidence of acute infarct, acute hemorrhage, mass lesion or midline shift. No hydrocephalus. No abnormal enhancement. Vascular: Major arterial flow voids are maintained at the skull base. Skull and upper cervical spine: Normal marrow signal. Sinuses/Orbits: Clear sinuses.  No acute orbital findings. IMPRESSION: 1. No evidence of acute intracranial abnormality. 2. Chronic encephalomalacia in the right occipital, parietal and posterior right frontal lobe Electronically  Signed   By: Gilmore GORMAN Molt M.D.   On: 02/18/2024 00:21     LOS: 3 days   Donalda Applebaum, MD  [facility]    To contact the attending provider between 7A-7P or the covering provider during after hours 7P-7A, please log into the web site [email or web address] and access using universal [place] password for that web site. If you do not have the password, please call the hospital operator.  02/19/2024, 11:24 AM

## 2024-02-19 NOTE — Progress Notes (Signed)
 PHARMACY - ANTICOAGULATION CONSULT NOTE  Pharmacy Consult for warfarin Indication: atrial fibrillation and DVT  Allergies  Allergen Reactions   Zonegran [Zonisamide] Other (See Comments)    Numbness and tingling over whole body   Chocolate Other (See Comments)    Triggers seizures   Codeine Nausea And Vomiting   Keppra  [Levetiracetam ] Other (See Comments)    Causes seizures   Lyrica  [Pregabalin ] Nausea And Vomiting   Olive Oil Rash    Patient Measurements: Height: 5' 7 (170.2 cm) Weight: 59 kg (130 lb) IBW/kg (Calculated) : 61.6 HEPARIN  DW (KG): 59  Vital Signs: Temp: 99.5 F (37.5 C) (07/18 1211) Temp Source: Oral (07/18 1211) BP: 112/69 (07/18 0600) Pulse Rate: 74 (07/18 0600)  Labs: Recent Labs    02/17/24 0401 02/18/24 0436 02/19/24 0346  HGB 9.8* 10.4* 9.9*  HCT 30.0* 31.2* 29.7*  PLT 125* 166 178  LABPROT 50.9* 42.7* 17.8*  INR 5.3* 4.3* 1.4*  CREATININE 0.62 0.61 0.82    Estimated Creatinine Clearance: 63.7 mL/min (by C-G formula based on SCr of 0.82 mg/dL).   Medical History: Past Medical History:  Diagnosis Date   Acute bronchitis 08/06/2022   Acute respiratory failure with hypoxia (HCC) 03/31/2015   Altered mental status    Asthma    Ataxia 08/26/2019   Atypical chest pain 02/17/2017   Cerebral hemorrhage (HCC) 1989   Chronic back pain    Closed fracture of distal end of left radius 07/17/2022   Closed fracture of fifth metatarsal bone 01/04/2019   Closed fracture of proximal phalanx of great toe 01/04/2019   Closed nondisplaced fracture of styloid process of left radius 08/06/2022   COPD (chronic obstructive pulmonary disease) (HCC)    no meds per daughter   COPD with exacerbation (HCC) 03/30/2015   Costochondritis 02/17/2017   COVID-19 virus infection 04/02/2020   DDD (degenerative disc disease) 06/12/2013   DVT (deep venous thrombosis) (HCC)    she's had several since 1990   GERD (gastroesophageal reflux disease)    no meds per  daughter   Headache    usually around time when she's had a seizure   History of blood transfusion    when she was a baby   History of kidney stones    History of stomach ulcers    Hypoxia 01/18/2015   Lactic acidosis 04/02/2020   Neuropathy    Seizures (HCC)    because of her brain surgery  last one 07/28/22   Sepsis secondary to UTI (HCC) 01/17/2015   Stroke [facility]) 1990's   family denies residual on 11/09/2014   Tunnel vision    since brain OR    Assessment: 89 yof presented to the ED with AMS. She is chronically anticoagulated on warfarin for VTE hx and Afib. Last dose of warfarin was 7/14 at 9 pm. Her INR is supratherapeutic at 4.3 but trending down from 7/15. No overt bleeding noted.  LP today   Goal of Therapy:  INR 2-3 Monitor platelets by anticoagulation protocol: Yes   Plan:  Hold warfarin today for LP Check INR daily while on warfarin  Continue to monitor H&H and platelets   Thank you.  Perri Olam Murray 02/19/2024,2:24 PM

## 2024-02-19 NOTE — Progress Notes (Addendum)
 NEUROLOGY CONSULT FOLLOW UP NOTE   Date of service: February 19, 2024 Patient Name: [name] MRN:  [id number] DOB:  03/13/1956  Interval Hx/subjective   Overall stable this morning without resolution of her headache but no new or worsening symptoms.  She did again fever overnight and continues on amiodarone  drip with well-controlled rate.  After receiving vitamin K  yesterday her INR is 1.4. Vitals   Vitals:   02/19/24 0000 02/19/24 0400 02/19/24 0600 02/19/24 0729  BP: 120/67 103/62 112/69   Pulse: 92 75 74   Resp: (!) 26 (!) 21 18   Temp: (!) 102.3 F (39.1 C) 98.7 F (37.1 C)  98.9 F (37.2 C)  TempSrc: Oral Oral  Oral  SpO2: 93% 94% 98%   Weight:      Height:         Body mass index is 20.36 kg/m.  Physical Exam   Gen: patient lying in bed, NAD CV: extremities appear well-perfused Resp: normal WOB  Neurologic exam MS: alert, oriented x4, follows commands Speech: no dysarthria, no aphasia CN: PERRL, left homonymous hemianopia, EOMI, sensation intact, face symmetric, hearing intact to voice Motor: 5/5 strength throughout Sensory: SILT Reflexes: 2+ symm with toes down bilat Coordination: FNF intact bilat Gait: deferred   Medications  Current Facility-Administered Medications:    acetaminophen  (TYLENOL ) tablet 650 mg, 650 mg, Oral, Q6H PRN, 650 mg at 02/19/24 1006 **OR** acetaminophen  (TYLENOL ) suppository 650 mg, 650 mg, Rectal, Q6H PRN, Claudene, Rondell A, MD   acyclovir  (ZOVIRAX ) 590 mg in dextrose  5 % 100 mL IVPB, 10 mg/kg, Intravenous, Q8H, Hershal Vito MATSU, RPH, Last Rate: 111.8 mL/hr at 02/19/24 0642, 590 mg at 02/19/24 9357   albuterol  (PROVENTIL ) (2.5 MG/3ML) 0.083% nebulizer solution 2.5 mg, 2.5 mg, Nebulization, Q6H PRN, Claudene, Rondell A, MD   [EXPIRED] amiodarone  (NEXTERONE  PREMIX) 360-4.14 MG/200ML-% (1.8 mg/mL) IV infusion, 60 mg/hr, Intravenous, Continuous, Last Rate: 33.3 mL/hr at 02/18/24 1055, 60 mg/hr at 02/18/24 1055 **FOLLOWED BY** amiodarone   (NEXTERONE  PREMIX) 360-4.14 MG/200ML-% (1.8 mg/mL) IV infusion, 30 mg/hr, Intravenous, Continuous, Ghimire, Shanker M, MD, Last Rate: 16.67 mL/hr at 02/19/24 0647, 30 mg/hr at 02/19/24 0647   ampicillin  (OMNIPEN) 2 g in sodium chloride  0.9 % 100 mL IVPB, 2 g, Intravenous, Q4H, Hershal Vito MATSU, RPH, Last Rate: 300 mL/hr at 02/19/24 1005, 2 g at 02/19/24 1005   anastrozole  (ARIMIDEX ) tablet 1 mg, 1 mg, Oral, Daily, Smith, Rondell A, MD, 1 mg at 02/19/24 9074   butalbital -acetaminophen -caffeine  (FIORICET ) 50-325-40 MG per tablet 1 tablet, 1 tablet, Oral, Q6H PRN, Claudene Reeves A, MD, 1 tablet at 02/17/24 1103   cefTRIAXone  (ROCEPHIN ) 2 g in sodium chloride  0.9 % 100 mL IVPB, 2 g, Intravenous, Q12H, Dennise Kingsley, MD, Last Rate: 200 mL/hr at 02/19/24 0924, 2 g at 02/19/24 0924   Chlorhexidine  Gluconate Cloth 2 % PADS 6 each, 6 each, Topical, Daily, Ghimire, Donalda HERO, MD, 6 each at 02/19/24 9072   folic acid  (FOLVITE ) tablet 1 mg, 1 mg, Oral, Daily, Smith, Rondell A, MD, 1 mg at 02/19/24 9075   lacosamide  (VIMPAT ) tablet 200 mg, 200 mg, Oral, BID, Debby Hitch A, MD, 200 mg at 02/19/24 9075   LORazepam  (ATIVAN ) injection 2 mg, 2 mg, Intravenous, Q5 min PRN, Debby Hitch LABOR, MD   ondansetron  (ZOFRAN ) tablet 4 mg, 4 mg, Oral, Q6H PRN, 4 mg at 02/16/24 1520 **OR** ondansetron  (ZOFRAN ) injection 4 mg, 4 mg, Intravenous, Q6H PRN, Claudene, Rondell A, MD   phenytoin  (DILANTIN ) ER capsule  130 mg, 130 mg, Oral, BID, Debby Hitch A, MD, 130 mg at 02/19/24 0925   potassium chloride  (KLOR-CON ) packet 60 mEq, 60 mEq, Oral, Daily, Ghimire, Shanker M, MD, 60 mEq at 02/19/24 9072   sodium chloride  flush (NS) 0.9 % injection 10-40 mL, 10-40 mL, Intracatheter, PRN, Pollina, Lonni PARAS, MD   sodium chloride  flush (NS) 0.9 % injection 10-40 mL, 10-40 mL, Intracatheter, Q12H, Ghimire, Shanker M, MD, 10 mL at 02/19/24 0925   sodium chloride  flush (NS) 0.9 % injection 10-40 mL, 10-40 mL, Intracatheter, PRN,  Ghimire, Donalda HERO, MD   sodium chloride  flush (NS) 0.9 % injection 3 mL, 3 mL, Intravenous, Q12H, Smith, Rondell A, MD, 3 mL at 02/19/24 0925   vancomycin  (VANCOREADY) IVPB 750 mg/150 mL, 750 mg, Intravenous, Q12H, Bevely Damien RAMAN, RPH, Last Rate: 150 mL/hr at 02/19/24 0026, 750 mg at 02/19/24 0026   Warfarin - Pharmacist Dosing Inpatient, , Does not apply, q1600, Claudene Maximino LABOR, MD  Labs and Diagnostic Imaging   CBC:  Recent Labs  Lab 02/18/24 0436 02/19/24 0346  WBC 5.6 5.2  HGB 10.4* 9.9*  HCT 31.2* 29.7*  MCV 101.3* 102.4*  PLT 166 178    Basic Metabolic Panel:  Lab Results  Component Value Date   NA 139 02/19/2024   K 3.7 02/19/2024   CO2 25 02/19/2024   GLUCOSE 121 (H) 02/19/2024   BUN <5 (L) 02/19/2024   CREATININE 0.82 02/19/2024   CALCIUM 8.1 (L) 02/19/2024   GFRNONAA >60 02/19/2024   GFRAA >60 04/24/2020   Lipid Panel:  Lab Results  Component Value Date   LDLCALC 112 (H) 10/10/2020   HgbA1c:  Lab Results  Component Value Date   HGBA1C 5.0 08/02/2020   Urine Drug Screen:     Component Value Date/Time   LABOPIA NONE DETECTED 02/17/2024 1244   COCAINSCRNUR NONE DETECTED 02/17/2024 1244   LABBENZ NONE DETECTED 02/17/2024 1244   AMPHETMU NONE DETECTED 02/17/2024 1244   THCU NONE DETECTED 02/17/2024 1244   LABBARB POSITIVE (A) 02/17/2024 1244    Alcohol Level     Component Value Date/Time   ETH <10 02/09/2021 1519   INR  Lab Results  Component Value Date   INR 1.4 (H) 02/19/2024   APTT  Lab Results  Component Value Date   APTT 75 (H) 02/16/2024   AED levels:  Lab Results  Component Value Date   PHENYTOIN  8.3 (L) 02/16/2024    CT Head without contrast(Personally reviewed): 1. No acute intracranial abnormality. 2. 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  rEEG:  02/16/2024- This study showed evidence of epileptogenicity and cortical dysfunction arising from right hemisphere, maximal right posterior quadrant. This  EEG pattern is on the ictal-interictal continuum with potential for seizures.  Additionally there is mild to moderate diffuse encephalopathy.  No definite seizures were noted.   Assessment   KATORIA YETMAN is a 68 y.o. 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  She presented febrile, tachycardic in A-fib with RVR, tachypneic, hypotensive, but without hypoxia.  UA showed microscopic hematuria and rare bacteria but no nitrites, leukocytes, or pyuria.  No leukocytosis, no severe electrolyte abnormalities or AKI, full RVP negative, CT head/abdomen/pelvis without acute abnormalities.  Phenytoin  level was subtherapeutic at 8.3 on admission.  She has a reported history of recurrent seizures often associated with urinary tract infections so she was empirically treated on admission with ceftriaxone .  Since admission vancomycin  was added for recurrent fevers on 02/17/2024 and ampicillin  and acyclovir  were added on 02/18/2024.  She has been unable to undergo lumbar puncture due to supratherapeutic INR but after receiving vitamin K  on 02/18/2024 her INR is 1.4.  Again she fevered overnight.  02/18/2024 morning she went back into A-fib with RVR was placed on amiodarone  drip.  She continues on this with normal rate.  Overall her story is still concerning for meningitis with recurrent fevers and persistent headache without other identifiable source of infection.  She is now on ceftriaxone  2 g daily, vancomycin , ampicillin , and acyclovir  for meningitis coverage.  ID is following.  INR is 1.4 this morning and we will proceed with lumbar puncture today.  Recommendations  Continue phenytoin  130 mg twice  daily and Vimpat  200 mg twice daily Continue ceftriaxone , vancomycin , ampicillin , and acyclovir  per ID LP this afternoon, will send cell count and 2 tubes, glucose, protein, culture, meningitis/encephalitis panel, AFB, fungal culture, and an extra tube for any add on tests   Recommend case management help with pharmacy issues to avoid any future lapses in AED doses. Pt is seizure free when adherent to current medications Seizure precautions Follows with [place] neurology, Dr. Georjean _____________________________________________________________________  Seizure precautions: Per [place]  DMV statutes, patients with seizures are not allowed to drive until they have been seizure-free for six months and cleared by a physician    Use caution when using heavy equipment or power tools. Avoid working on ladders or at heights. Take showers instead of baths. Ensure the water temperature is not too high on the home water heater. Do not go swimming alone. Do not lock yourself in a room alone (i.e. bathroom). When caring for infants or small children, sit down when holding, feeding, or changing them to minimize risk of injury to the child in the event you have a seizure. Maintain good sleep hygiene. Avoid alcohol.    If patient has another seizure, call 911 and bring them back to the ED if: A.  The seizure lasts longer than 5 minutes.      B.  The patient doesn't wake shortly after the seizure or has new problems such as difficulty seeing, speaking or moving following the seizure C.  The patient was injured during the seizure D.  The patient has a temperature over 102 F (39C) E.  The patient vomited during the seizure and now is having trouble breathing    During the Seizure   - First, ensure adequate ventilation and place patients on the floor on their left side  Loosen clothing around the neck and ensure the airway is patent. If the patient is clenching the teeth, do not force the mouth open with  any object as this can cause severe damage - Remove all items from the surrounding that can be hazardous. The patient may be oblivious to what's happening and may not even know what he or she is doing. If the patient is confused and wandering, either gently guide him/her away and block access to outside areas - Reassure the individual and be comforting - Call 911. In most cases, the seizure ends before EMS arrives. However, there are cases when seizures may last over 3 to 5 minutes. Or the individual may have developed breathing difficulties or severe injuries.  If a pregnant patient or a person with diabetes develops a seizure, it is prudent to call an ambulance. - Finally, if the patient does not regain full consciousness, then call EMS. Most patients will remain confused for about 45 to 90 minutes after a seizure, so you must use judgment in calling for help. - Avoid restraints but make sure the patient is in a bed with padded side rails - Place the individual in a lateral position with the neck slightly flexed; this will help the saliva drain from the mouth and prevent the tongue from falling backward - Remove all nearby furniture and other hazards from the area - Provide verbal assurance as the individual is regaining consciousness - Provide the patient with privacy if possible - Call for help and start treatment as ordered by the caregiver    After the Seizure (Postictal Stage)   After a seizure, most patients experience confusion, fatigue, muscle pain and/or a headache. Thus, one should permit the individual to sleep. For the next few days, reassurance is essential. Being calm and helping reorient the person is also of importance.   Most seizures are painless and end spontaneously. Seizures are not harmful to others but can lead to complications such as stress on the lungs, brain and the heart. Individuals with prior lung problems may develop labored breathing and respiratory  distress.   Signed, Fairy Pool, DO Internal Medicine Resident, PGY-3 10:12 AM 02/19/2024   Attending Neurohospitalist Addendum Patient seen and examined with APP/Resident. Agree with the history and physical as documented above. Agree with the plan as documented, which I helped formulate. I have edited the note above to reflect my full findings and recommendations. I have independently reviewed the chart, obtained history, review of systems and examined the patient.I have personally reviewed pertinent head/neck/spine imaging (CT/MRI). Please feel free to call with any questions.  -- Elida Ross, MD [facility] [phone]  If 7pm- 7am, please page neurology on call as listed in AMION.

## 2024-02-19 NOTE — Progress Notes (Signed)
 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  His disease engaged as patient continued to have fevers. #Fever with concern for meningitis #Elevated INR #Seizures on Vimpat  and Dilantin  - Patient presented with altered mental status, disorganized speech and unusual behavior.  She has a history of seizure.  On arrival she was febrile with temp 103, WBC 5.8K.  She started on ceftriaxone . - CT head abdomen pelvis was pretty unrevealing.  CT head shows chronic encephalomalacia. - Clinically she is improved today per my discussion with primary.  During my visit she was AO x 3 although she was having some photophobia and headaches. -She has no abdominal pain no dysuria. -EEG done which notes pattern is on the ictal-interictal continuum and with potential for seizures - afib rvr overnight-amiodorone started Recommendations: -Continue Vanc+ ctx+ amp + acyclovir  while waiting for LP. Added doxy as tick serology pending(lyme/rmsf/ehrlichia).  -INR reversed 1.4 today. Neuro plans on lp today please obtain cell count, bacterial/afb/fungal , cytology . Recommend non-infectious work up; paraneoplastic work-up -ANA/RF/CCP - Droplet precautions  -Follow blood Cx -Dr. Overton is covering this weekend. Ill be back on service on Monday  Evaluation of  this patient requires complex antimicrobial therapy evaluation and counseling + isolation needs for disease transmission risk assessment and mitigation    Microbiology:   Antibiotics: Ceftriaxone  7/14-  vanc 7/15- Amp+ acyclovir    Cultures: Blood 7/15     SUBJECTIVE: Febrile overnight, tmax 102.3. Resting in bed. She states she feels about ghd same today.   Review of Systems: Review of Systems  All other systems reviewed and are negative.    Scheduled Meds:  anastrozole   1 mg Oral Daily   Chlorhexidine  Gluconate Cloth  6 each Topical Daily   doxycycline   100 mg Oral Q12H   folic acid   1 mg Oral Daily   lacosamide   200 mg Oral BID   phenytoin   130 mg Oral BID   potassium chloride   60 mEq Oral Daily   sodium chloride  flush  10-40 mL Intracatheter Q12H   sodium chloride  flush  3 mL Intravenous Q12H   Warfarin - Pharmacist Dosing Inpatient   Does not apply q1600   Continuous Infusions:  acyclovir  (ZOVIRAX ) 590 mg in dextrose  5 % 100 mL IVPB 590 mg (02/19/24 1338)   amiodarone  30 mg/hr (02/19/24 1929)   ampicillin  (OMNIPEN) IV 2 g (02/19/24 1820)   cefTRIAXone  (ROCEPHIN )  IV 2 g (02/19/24 0924)   vancomycin  750 mg (02/19/24 1216)   PRN Meds:.acetaminophen  **OR** acetaminophen , albuterol , butalbital -acetaminophen -caffeine , LORazepam , ondansetron  **OR** ondansetron  (ZOFRAN ) IV, sodium chloride  flush, sodium chloride  flush Allergies  Allergen Reactions   Zonegran [Zonisamide] Other (See Comments)    Numbness and tingling over whole body   Chocolate Other (See Comments)    Triggers seizures   Codeine Nausea And Vomiting   Keppra  [  Levetiracetam ] Other (See Comments)    Causes seizures   Lyrica  [Pregabalin ] Nausea And Vomiting   Olive Oil Rash    OBJECTIVE: Vitals:   02/19/24 0729 02/19/24 1211 02/19/24 1517 02/19/24 2000  BP:   122/67 137/83  Pulse:    97  Resp:    19  Temp: 98.9 F (37.2 C) 99.5 F (37.5 C) 99.1 F (37.3 C) 99.9 F (37.7 C)  TempSrc: Oral  Oral Oral Oral  SpO2:    95%  Weight:      Height:       Body mass index is 20.36 kg/m.  Physical Exam Constitutional:      Appearance: Normal appearance.  HENT:     Head: Normocephalic and atraumatic.     Right Ear: Tympanic membrane normal.     Left Ear: Tympanic membrane normal.     Nose: Nose normal.     Mouth/Throat:     Mouth: Mucous membranes are moist.  Eyes:     Extraocular Movements: Extraocular movements intact.     Conjunctiva/sclera: Conjunctivae normal.     Pupils: Pupils are equal, round, and reactive to light.  Cardiovascular:     Rate and Rhythm: Normal rate and regular rhythm.     Heart sounds: No murmur heard.    No friction rub. No gallop.  Pulmonary:     Breath sounds: Normal breath sounds.  Abdominal:     General: Abdomen is flat.     Palpations: Abdomen is soft.  Skin:    General: Skin is warm and dry.  Neurological:     General: No focal deficit present.     Mental Status: She is alert and oriented to person, place, and time.  Psychiatric:        Mood and Affect: Mood normal.       Lab Results Lab Results  Component Value Date   WBC 5.2 02/19/2024   HGB 9.9 (L) 02/19/2024   HCT 29.7 (L) 02/19/2024   MCV 102.4 (H) 02/19/2024   PLT 178 02/19/2024    Lab Results  Component Value Date   CREATININE 0.82 02/19/2024   BUN <5 (L) 02/19/2024   NA 139 02/19/2024   K 3.7 02/19/2024   CL 106 02/19/2024   CO2 25 02/19/2024    Lab Results  Component Value Date   ALT 19 02/19/2024   AST 21 02/19/2024   ALKPHOS 61 02/19/2024   BILITOT 0.4 02/19/2024        Loney Stank, MD [facility] 02/19/2024, 9:48 PM

## 2024-02-19 NOTE — Plan of Care (Signed)
  Problem: Health Behavior/Discharge Planning: Goal: Ability to manage health-related needs will improve Outcome: Progressing   Problem: Clinical Measurements: Goal: Ability to maintain clinical measurements within normal limits will improve Outcome: Progressing   Problem: Activity: Goal: Risk for activity intolerance will decrease Outcome: Progressing   Problem: Coping: Goal: Level of anxiety will decrease Outcome: Progressing   Problem: Elimination: Goal: Will not experience complications related to bowel motility Outcome: Progressing

## 2024-02-19 NOTE — Procedures (Signed)
 Procedure Note: Lumbar puncture  Indications: obtain CSF sample to assess for CNS pathology   Operator: Cortney de la Abbey, NP  Others present:   Indications, risks, and benefits explained to patient / surrogate decision maker and informed consent obtained. Time-out was performed, with all individuals present agreeing on the procedure to be performed, the site of procedure, and the patient identity.  Patient positioned, prepped and draped in usual sterile fashion. L3-4 space located using bilateral iliac crests as landmarks. 1% Lidocaine  without epinephrine  was used to anesthetize the area.  Attempted to introduce a 20G spinal needle into the sub- arachnoid space without success. Blood loss was minimal. A dry gauze dressing was placed over insertion site. Patient tolerated the procedure well and no complications were observed. Spontaneous movement of bilateral extremities were observed after the procedure.  Procedure to be reattempted in interventional radiology.  Cortney E Everitt Clint Abbey , MSN, AGACNP-BC [facility] See Amion for schedule and pager information 02/19/2024 5:05 PM

## 2024-02-19 NOTE — Progress Notes (Signed)
 There was consult for lab work. Talked to Dr. Dennise regarding that it is okay to do together with tomorrow morning lab. Dr. Dennise was okay to do tomorrow. Informed patient's RN this matter by secure chat. HS [employer]

## 2024-02-19 NOTE — Progress Notes (Signed)
 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  His disease engaged as patient continued to have fevers. #Fever with concern for meningitis #Elevated INR #Seizures on Vimpat  and Dilantin  - Patient presented with altered mental status, disorganized speech and unusual behavior.  She has a history of seizure.  On arrival she was febrile with temp 103, WBC 5.8K.  She started on ceftriaxone . - CT head abdomen pelvis was pretty unrevealing.  CT head shows chronic encephalomalacia. - Clinically she is improved today per my discussion with primary.  During my visit she was AO x 3 although she was having some photophobia and headaches. -She has no abdominal pain no dysuria. -EEG done which notes pattern is on the ictal-interictal continuum and with potential for seizures - afib rvr overnight-amiodorone started Recommendations: Amp and acyclovir  added overnight. She noted daughter has COVID, pt's covid test negative on admission, ok to repeat Her breathing seemed shallow, although sat ay 97%(informed primary). Continues to be febrile on brad spectrum Abx. I am concerned for non-infectious causes of fever, I think lp to investigate appropriate. Neuro awaiting INR to drop to less than 1.5(cards input in regards to Vit K to reverse, in the  setting of new afib). Follow blood Cx - Droplet precautions   Evaluation of this patient requires complex antimicrobial therapy evaluation and counseling + isolation needs for disease transmission risk assessment and mitigation    Microbiology:   Antibiotics: Ceftriaxone  7/14-  vanc 7/15- Amp+ acyclovir    Cultures: Blood 7/15     SUBJECTIVE: Febrile overnight. Afib overnight  Review of Systems: Review of Systems  All other systems reviewed and are negative.    Scheduled Meds:  anastrozole   1 mg Oral Daily   Chlorhexidine  Gluconate Cloth  6 each Topical Daily   folic acid   1 mg Oral Daily   lacosamide   200 mg Oral BID   phenytoin   130 mg Oral BID   potassium chloride   60 mEq Oral Daily   sodium chloride  flush  10-40 mL Intracatheter Q12H   sodium chloride  flush  3 mL Intravenous Q12H   Warfarin - Pharmacist Dosing Inpatient   Does not apply q1600   Continuous Infusions:  acyclovir  (ZOVIRAX ) 590 mg in dextrose  5 % 100 mL IVPB 590 mg (02/18/24 2316)   amiodarone  30 mg/hr (02/18/24 1822)   ampicillin  (OMNIPEN) IV 2 g (02/19/24 0227)   cefTRIAXone  (ROCEPHIN )  IV 2 g (02/19/24 0023)   vancomycin  750 mg (02/19/24 0026)   PRN Meds:.acetaminophen  **OR** acetaminophen , albuterol , butalbital -acetaminophen -caffeine , LORazepam , ondansetron  **OR** ondansetron  (ZOFRAN ) IV, sodium chloride  flush, sodium chloride  flush Allergies  Allergen Reactions   Zonegran [Zonisamide] Other (See Comments)    Numbness and tingling over whole body   Chocolate Other (See Comments)    Triggers seizures   Codeine Nausea And Vomiting   Keppra  [Levetiracetam ]  Other (See Comments)    Causes seizures   Lyrica  [Pregabalin ] Nausea And Vomiting   Olive Oil Rash    OBJECTIVE: Vitals:   02/18/24 1743 02/18/24 2035 02/19/24 0000 02/19/24 0400  BP: (!) 100/47 (!) 128/52 120/67 103/62  Pulse: 96 84 92 75  Resp: 15 14 (!) 26 (!) 21  Temp: 99.8 F (37.7 C) (!) 100.8 F (38.2 C) (!) 102.3 F (39.1  C)   TempSrc: Oral Oral Oral   SpO2: 93% 96% 93% 94%  Weight:      Height:       Body mass index is 20.36 kg/m.  Physical Exam Constitutional:      Appearance: Normal appearance.  HENT:     Head: Normocephalic and atraumatic.     Right Ear: Tympanic membrane normal.     Left Ear: Tympanic membrane normal.     Nose: Nose normal.     Mouth/Throat:     Mouth: Mucous membranes are moist.  Eyes:     Extraocular Movements: Extraocular movements intact.     Conjunctiva/sclera: Conjunctivae normal.     Pupils: Pupils are equal, round, and reactive to light.  Cardiovascular:     Rate and Rhythm: Normal rate and regular rhythm.     Heart sounds: No murmur heard.    No friction rub. No gallop.  Pulmonary:     Breath sounds: Normal breath sounds.  Abdominal:     General: Abdomen is flat.     Palpations: Abdomen is soft.  Musculoskeletal:        General: Normal range of motion.  Skin:    General: Skin is warm and dry.  Neurological:     General: No focal deficit present.     Mental Status: She is alert and oriented to person, place, and time.  Psychiatric:        Mood and Affect: Mood normal.       Lab Results Lab Results  Component Value Date   WBC 5.2 02/19/2024   HGB 9.9 (L) 02/19/2024   HCT 29.7 (L) 02/19/2024   MCV 102.4 (H) 02/19/2024   PLT 178 02/19/2024    Lab Results  Component Value Date   CREATININE 0.82 02/19/2024   BUN <5 (L) 02/19/2024   NA 139 02/19/2024   K 3.7 02/19/2024   CL 106 02/19/2024   CO2 25 02/19/2024    Lab Results  Component Value Date   ALT 19 02/19/2024   AST 21 02/19/2024   ALKPHOS 61 02/19/2024   BILITOT 0.4 02/19/2024        Loney Stank, MD [facility] 02/19/2024, 5:39 AM

## 2024-02-20 ENCOUNTER — Inpatient Hospital Stay (HOSPITAL_COMMUNITY)

## 2024-02-20 DIAGNOSIS — G9340 Encephalopathy, unspecified: Secondary | ICD-10-CM | POA: Diagnosis not present

## 2024-02-20 DIAGNOSIS — G0490 Encephalitis and encephalomyelitis, unspecified: Secondary | ICD-10-CM

## 2024-02-20 DIAGNOSIS — G039 Meningitis, unspecified: Secondary | ICD-10-CM | POA: Diagnosis not present

## 2024-02-20 DIAGNOSIS — G40909 Epilepsy, unspecified, not intractable, without status epilepticus: Secondary | ICD-10-CM | POA: Diagnosis not present

## 2024-02-20 DIAGNOSIS — A419 Sepsis, unspecified organism: Secondary | ICD-10-CM | POA: Diagnosis not present

## 2024-02-20 DIAGNOSIS — R6520 Severe sepsis without septic shock: Secondary | ICD-10-CM | POA: Diagnosis not present

## 2024-02-20 LAB — MENINGITIS/ENCEPHALITIS PANEL (CSF)

## 2024-02-20 LAB — CRYPTOCOCCAL ANTIGEN, CSF: Crypto Ag: NEGATIVE

## 2024-02-20 LAB — CBC
HCT: 30.5 % — ABNORMAL LOW (ref 36.0–46.0)
Hemoglobin: 9.9 g/dL — ABNORMAL LOW (ref 12.0–15.0)
MCH: 33.3 pg (ref 26.0–34.0)
MCHC: 32.5 g/dL (ref 30.0–36.0)
MCV: 102.7 fL — ABNORMAL HIGH (ref 80.0–100.0)
Platelets: 188 K/uL (ref 150–400)
RBC: 2.97 MIL/uL — ABNORMAL LOW (ref 3.87–5.11)
RDW: 12.5 % (ref 11.5–15.5)
WBC: 4.2 K/uL (ref 4.0–10.5)
nRBC: 0 % (ref 0.0–0.2)

## 2024-02-20 LAB — CSF CELL COUNT WITH DIFFERENTIAL
RBC Count, CSF: 2 /mm3 — ABNORMAL HIGH
RBC Count, CSF: 4 /mm3 — ABNORMAL HIGH
Tube #: 1
Tube #: 4
WBC, CSF: 1 /mm3 (ref 0–5)
WBC, CSF: 1 /mm3 (ref 0–5)

## 2024-02-20 LAB — PROTIME-INR
INR: 1.2 (ref 0.8–1.2)
Prothrombin Time: 16.3 s — ABNORMAL HIGH (ref 11.4–15.2)

## 2024-02-20 LAB — BASIC METABOLIC PANEL WITH GFR
Anion gap: 9 (ref 5–15)
BUN: <5 mg/dL — ABNORMAL LOW (ref 8–23)
CO2: 26 mmol/L (ref 22–32)
Calcium: 8.2 mg/dL — ABNORMAL LOW (ref 8.9–10.3)
Chloride: 102 mmol/L (ref 98–111)
Creatinine, Ser: 0.58 mg/dL (ref 0.44–1.00)
GFR, Estimated: >60 mL/min (ref >60)
Glucose, Bld: 132 mg/dL — ABNORMAL HIGH (ref 70–99)
Potassium: 3.1 mmol/L — ABNORMAL LOW (ref 3.5–5.1)
Sodium: 137 mmol/L (ref 135–145)

## 2024-02-20 LAB — PROTEIN AND GLUCOSE, CSF
Glucose, CSF: 75 mg/dL — ABNORMAL HIGH (ref 40–70)
Total  Protein, CSF: 17 mg/dL (ref 15–45)

## 2024-02-20 MED ORDER — LIDOCAINE 1 % OPTIME INJ - NO CHARGE
5.0000 mL | Freq: Once | INTRAMUSCULAR | Status: AC
  Administered 2024-02-20: 5 mL via INTRADERMAL

## 2024-02-20 MED ORDER — POTASSIUM CHLORIDE CRYS ER 20 MEQ PO TBCR
60.0000 meq | EXTENDED_RELEASE_TABLET | Freq: Every day | ORAL | Status: DC
  Administered 2024-02-21: 60 meq via ORAL
  Filled 2024-02-20: qty 3

## 2024-02-20 MED ORDER — POTASSIUM CHLORIDE 20 MEQ PO PACK
60.0000 meq | PACK | Freq: Two times a day (BID) | ORAL | Status: AC
  Administered 2024-02-20 (×2): 60 meq via ORAL
  Filled 2024-02-20 (×2): qty 3

## 2024-02-20 MED ORDER — LACTATED RINGERS IV SOLN: INTRAVENOUS | Status: DC

## 2024-02-20 NOTE — Progress Notes (Signed)
 NEUROLOGY CONSULT FOLLOW UP NOTE   Date of service: February 20, 2024 Patient Name: [name] MRN:  [id number] DOB:  [birth year]  Interval Hx/subjective   Patient underwent IR LP today No other acute events Vitals   Vitals:   02/20/24 0000 02/20/24 0200 02/20/24 0722 02/20/24 1115  BP: 116/74 116/62 (!) 95/57 (!) 110/59  Pulse: 88  78   Resp: 18 16 20 20   Temp:   98.9 F (37.2 C) 98.4 F (36.9 C)  TempSrc:   Oral Oral  SpO2: 94%     Weight:      Height:         Body mass index is 20.36 kg/[name].  Physical Exam   Gen: patient lying in bed, NAD CV: extremities appear well-perfused Resp: normal WOB  Neurologic exam MS: alert, oriented x4, follows commands Speech: no dysarthria, no aphasia CN: PERRL, left homonymous hemianopia, EOMI, sensation intact, face symmetric, hearing intact to voice Motor: 5/5 strength throughout Sensory: SILT Reflexes: 2+ symm with toes down bilat Coordination: FNF intact bilat Gait: deferred   Medications  Current Facility-Administered Medications:    acetaminophen  (TYLENOL ) tablet 650 mg, 650 mg, Oral, Q6H PRN, 650 mg at 02/19/24 1006 **OR** acetaminophen  (TYLENOL ) suppository 650 mg, 650 mg, Rectal, Q6H PRN, [name], [name], [name]   acyclovir  (ZOVIRAX ) 590 mg in dextrose  5 % 100 mL IVPB, 10 mg/kg, Intravenous, Q8H, [name], [name], Last Rate: 111.8 mL/hr at 02/20/24 1655, 590 mg at 02/20/24 1655   albuterol  (PROVENTIL ) (2.5 MG/3ML) 0.083% nebulizer solution 2.5 mg, 2.5 mg, Nebulization, Q6H PRN, [name], [name], [name]   ampicillin  (OMNIPEN) 2 g in sodium chloride  0.9 % 100 mL IVPB, 2 g, Intravenous, Q4H, [name], [name], Last Rate: 300 mL/hr at 02/20/24 1656, 2 g at 02/20/24 1656   anastrozole  (ARIMIDEX ) tablet 1 mg, 1 mg, Oral, Daily, [name], [name], [name], 1 mg at 02/20/24 1051   butalbital -acetaminophen -caffeine  (FIORICET ) 50-325-40 MG per tablet 1 tablet, 1 tablet, Oral, Q6H PRN, [name] Reeves [name], [name], 1 tablet at 02/17/24 1103    cefTRIAXone  (ROCEPHIN ) 2 g in sodium chloride  0.9 % 100 mL IVPB, 2 g, Intravenous, Q12H, [name] Kingsley, [name], Last Rate: 200 mL/hr at 02/20/24 1045, 2 g at 02/20/24 1045   Chlorhexidine  Gluconate Cloth 2 % PADS 6 each, 6 each, Topical, Daily, [name], [name], 6 each at 02/20/24 1158   doxycycline  (VIBRA -TABS) tablet 100 mg, 100 mg, Oral, Q12H, [name] Kingsley, [name], 100 mg at 02/20/24 1051   folic acid  (FOLVITE ) tablet 1 mg, 1 mg, Oral, Daily, [name], [name], [name], 1 mg at 02/20/24 1051   lacosamide  (VIMPAT ) tablet 200 mg, 200 mg, Oral, BID, [name] [name], [name], 200 mg at 02/20/24 1051   lactated ringers  infusion, , Intravenous, Continuous, [name], [name]-CPP, Last Rate: 50 mL/hr at 02/20/24 1424, New Bag at 02/20/24 1424   LORazepam  (ATIVAN ) injection 2 mg, 2 mg, Intravenous, Q5 min PRN, [name] LABOR, [name]   ondansetron  (ZOFRAN ) tablet 4 mg, 4 mg, Oral, Q6H PRN, 4 mg at 02/16/24 1520 **OR** ondansetron  (ZOFRAN ) injection 4 mg, 4 mg, Intravenous, Q6H PRN, [name], [name], [name]   phenytoin  (DILANTIN ) ER capsule 130 mg, 130 mg, Oral, BID, [name] [name], [name], 130 mg at 02/20/24 1051   potassium chloride  (KLOR-CON ) packet 60 mEq, 60 mEq, Oral, BID, [name], [name]-CPP, 60 mEq at 02/20/24 1051   [START ON 02/21/2024] potassium chloride  SA (KLOR-CON  [name]) CR tablet 60 mEq,  60 mEq, Oral, Daily, [name], [name]   sodium chloride  flush (NS) 0.9 % injection 10-40 mL, 10-40 mL, Intracatheter, PRN, [name], [name]   sodium chloride  flush (NS) 0.9 % injection 10-40 mL, 10-40 mL, Intracatheter, Q12H, [name], [name], [name], 10 mL at 02/19/24 2200   sodium chloride  flush (NS) 0.9 % injection 10-40 mL, 10-40 mL, Intracatheter, PRN, [name], [name]   sodium chloride  flush (NS) 0.9 % injection 3 mL, 3 mL, Intravenous, Q12H, [name], [name], [name], 3 mL at 02/19/24 2200   vancomycin  (VANCOREADY) IVPB 750 mg/150 mL, 750 mg, Intravenous, Q12H, [name], [name], Last Rate: 150  mL/hr at 02/20/24 1156, 750 mg at 02/20/24 1156   Warfarin - Pharmacist Dosing Inpatient, , Does not apply, q1600, [name] [name] LABOR, [name], Given at 02/20/24 1657  Labs and Diagnostic Imaging   CBC:  Recent Labs  Lab 02/19/24 0346 02/20/24 0443  WBC 5.2 4.2  HGB 9.9* 9.9*  HCT 29.7* 30.5*  MCV 102.4* 102.7*  PLT 178 188    Basic Metabolic Panel:  Lab Results  Component Value Date   NA 137 02/20/2024   K 3.1 (L) 02/20/2024   CO2 26 02/20/2024   GLUCOSE 132 (H) 02/20/2024   BUN <5 (L) 02/20/2024   CREATININE 0.58 02/20/2024   CALCIUM  8.2 (L) 02/20/2024   GFRNONAA >60 02/20/2024   GFRAA >60 04/24/2020   Lipid Panel:  Lab Results  Component Value Date   LDLCALC 112 (H) 10/10/2020   HgbA1c:  Lab Results  Component Value Date   HGBA1C 5.0 08/02/2020   Urine Drug Screen:     Component Value Date/Time   LABOPIA NONE DETECTED 02/17/2024 1244   COCAINSCRNUR NONE DETECTED 02/17/2024 1244   LABBENZ NONE DETECTED 02/17/2024 1244   AMPHETMU NONE DETECTED 02/17/2024 1244   THCU NONE DETECTED 02/17/2024 1244   LABBARB POSITIVE ([name]) 02/17/2024 1244    Alcohol Level     Component Value Date/Time   ETH <10 02/09/2021 1519   INR  Lab Results  Component Value Date   INR 1.2 02/20/2024   APTT  Lab Results  Component Value Date   APTT 75 (H) 02/16/2024   AED levels:  Lab Results  Component Value Date   PHENYTOIN  8.3 (L) 02/16/2024    CT Head without contrast(Personally reviewed): 1. No acute intracranial abnormality. 2. 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  rEEG:  02/16/2024- This study showed evidence of epileptogenicity and cortical dysfunction arising from right hemisphere, maximal right posterior quadrant. This EEG pattern is on the ictal-interictal continuum with potential for seizures.  Additionally there is mild to moderate diffuse encephalopathy.  No definite seizures were noted.   IR LP performed today; results pending  Assessment    TJUANA [name] is [name] 68 y.o. 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  She presented febrile, tachycardic in [name]-fib with RVR, tachypneic, hypotensive, but without hypoxia.  UA showed microscopic hematuria and rare bacteria but no nitrites, leukocytes, or pyuria.  No leukocytosis, no severe electrolyte abnormalities or AKI, full RVP negative, CT head/abdomen/pelvis without acute abnormalities.  Phenytoin  level was subtherapeutic at 8.3 on admission.  She has [name] reported history of recurrent seizures  often associated with urinary tract infections so she was empirically treated on admission with ceftriaxone .  Since admission vancomycin  was added for recurrent fevers on 02/17/2024 and ampicillin  and acyclovir  were added on 02/18/2024.  She has been unable to undergo lumbar puncture due to supratherapeutic INR but after receiving vitamin K  on 02/18/2024 her INR is 1.4.  Again she fevered overnight.  02/18/2024 morning she went back into [name]-fib with RVR was placed on amiodarone  drip.  She continues on this with normal rate.  Overall her story is still concerning for meningitis with recurrent fevers and persistent headache without other identifiable source of infection.  She is now on ceftriaxone  2 g daily, vancomycin , ampicillin , and acyclovir  for meningitis coverage.  ID is following.  IR LP performed today; results pending  Recommendations  Continue phenytoin  130 mg twice daily and Vimpat  200 mg twice daily Continue ceftriaxone , vancomycin , ampicillin , and acyclovir  per ID F/u LP results: cell count in 2 tubes, glucose, protein, culture, meningitis/encephalitis panel, AFB, fungal culture,  paraneoplastic panel, an extra tube for any add on tests   Recommend case management help with pharmacy issues to avoid any future lapses in AED doses. Pt is seizure free when adherent to current medications Seizure precautions Follows with [place] neurology, Dr. Georjean _____________________________________________________________________  Seizure precautions: Per [place]  DMV statutes, patients with seizures are not allowed to drive until they have been seizure-free for six months and cleared by [name] physician    Use caution when using heavy equipment or power tools. Avoid working on ladders or at heights. Take showers instead of baths. Ensure the water temperature is not too high on the home water heater. Do not go swimming alone. Do not lock yourself in [name] room alone (i.e. bathroom). When caring for infants or small children, sit down when holding, feeding, or changing them to minimize risk of injury to the child in the event you have [name] seizure. Maintain good sleep hygiene. Avoid alcohol.    If patient has another seizure, call 911 and bring them back to the ED if: [name].  The seizure lasts longer than 5 minutes.      B.  The patient doesn't wake shortly after the seizure or has new problems such as difficulty seeing, speaking or moving following the seizure C.  The patient was injured during the seizure D.  The patient has [name] temperature over 102 F (39C) E.  The patient vomited during the seizure and now is having trouble breathing    During the Seizure   - First, ensure adequate ventilation and place patients on the floor on their left side  Loosen clothing around the neck and ensure the airway is patent. If the patient is clenching the teeth, do not force the mouth open with any object as this can cause severe damage - Remove all items from the surrounding that can be hazardous. The patient may be oblivious to what's happening and may not even know what he or she is doing. If the patient  is confused and wandering, either gently guide him/her away and block access to outside areas - Reassure the individual and be comforting - Call 911. In most cases, the seizure ends before EMS arrives. However, there are cases when seizures may last over 3 to 5 minutes. Or the individual may have developed breathing difficulties or severe injuries. If [name] pregnant patient or [name] person with diabetes develops [name] seizure, it is prudent to call an ambulance. - Finally, if the patient does  not regain full consciousness, then call EMS. Most patients will remain confused for about 45 to 90 minutes after [name] seizure, so you must use judgment in calling for help. - Avoid restraints but make sure the patient is in [name] bed with padded side rails - Place the individual in [name] lateral position with the neck slightly flexed; this will help the saliva drain from the mouth and prevent the tongue from falling backward - Remove all nearby furniture and other hazards from the area - Provide verbal assurance as the individual is regaining consciousness - Provide the patient with privacy if possible - Call for help and start treatment as ordered by the caregiver    After the Seizure (Postictal Stage)   After [name] seizure, most patients experience confusion, fatigue, muscle pain and/or [name] headache. Thus, one should permit the individual to sleep. For the next few days, reassurance is essential. Being calm and helping reorient the person is also of importance.   Most seizures are painless and end spontaneously. Seizures are not harmful to others but can lead to complications such as stress on the lungs, brain and the heart. Individuals with prior lung problems may develop labored breathing and respiratory distress.  Elida Ross, [name] [phone]  If 7pm- 7am, please page neurology on call as listed in AMION.

## 2024-02-20 NOTE — Progress Notes (Addendum)
 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  His disease engaged as patient continued to have fevers. #Fever with concern for meningitis #Elevated INR #Seizures on Vimpat  and Dilantin  - Patient presented with altered mental status, disorganized speech and unusual behavior.  She has a history of seizure.  On arrival she was febrile with temp 103, WBC 5.8K.  She started on ceftriaxone . - CT head abdomen pelvis was pretty unrevealing.  CT head shows chronic encephalomalacia. - Clinically she is improved today per my discussion with primary.  During my visit she was AO x 3 although she was having some photophobia and headaches. -She has no abdominal pain no dysuria. -EEG done which notes pattern is on the ictal-interictal continuum and with potential for seizures - afib rvr overnight-amiodorone started  ------------------- 02/20/24 id assessment Mentating better; coherant history Headache main issue without photophobia No other focal sign/sx outside of minor throat tickling Admission mri brain sign of prior stroke but nothing acute   Fever curve appears to be improving since 7/17; no leukocytosis All blood cx negative No lft abnormality/rash/diarrhea -- I have very low suspicion for tickborne illness  This is the season of  mosquitoes and wouldn't be surprised if this was an arbovirus process.  Hsv 1 process possible but low suspicion   She appears to be recovering quickly  7/19 IR lp 18 cm lp 15 mL csf collected -- clear colorless -- pending basic chemistry, cell count, and molecular analysis  Daughter has covid but patient tested negative No pet bird to suspect psittacosis and no other exposure to suggest qfever. Cat at home but atypical for bartonella process     Recommendations: Ok to continue empiric abx and peel off based on clinical/csf analysis Come Monday if csf pcr/micro negative and for semantic purpose team can send serum/csf arbovirus serology if desired (it is a [place] lab testing send out) Continue droplet precaution Discussed with primary team   Microbiology:   Antibiotics: Ceftriaxone  7/14- vanc 7/15- Amp 7/17- Acyclovir   7/17- Doxy 7/18-   Cultures: Blood 7/15     SUBJECTIVE: Afebrile since 7/17 Conversant Headache Tickling throat  No other complaint including rash/gi illness/joint pain/myalgia Pet cat at home No birds/livestock/poultry No recent travel  Review of Systems: Review of Systems  All other systems reviewed and are negative.    Scheduled Meds:  anastrozole   1 mg Oral Daily   Chlorhexidine  Gluconate Cloth  6 each Topical Daily   doxycycline   100 mg Oral Q12H   folic acid   1 mg Oral Daily   lacosamide   200 mg Oral BID   phenytoin   130 mg Oral BID   potassium chloride   60 mEq Oral BID   [START ON 02/21/2024] potassium chloride   60 mEq Oral Daily   sodium chloride  flush  10-40 mL Intracatheter Q12H  sodium chloride  flush  3 mL Intravenous Q12H   Warfarin - Pharmacist Dosing Inpatient   Does not apply q1600   Continuous Infusions:  acyclovir  (ZOVIRAX ) 590 mg in dextrose  5 % 100 mL IVPB 590 mg (02/20/24 0550)   ampicillin  (OMNIPEN) IV 2 g (02/20/24 1425)   cefTRIAXone  (ROCEPHIN )  IV 2 g (02/20/24 1045)   lactated ringers  50 mL/hr at  02/20/24 1424   vancomycin  750 mg (02/20/24 1156)   PRN Meds:.acetaminophen  **OR** acetaminophen , albuterol , butalbital -acetaminophen -caffeine , LORazepam , ondansetron  **OR** ondansetron  (ZOFRAN ) IV, sodium chloride  flush, sodium chloride  flush Allergies  Allergen Reactions   Zonegran [Zonisamide] Other (See Comments)    Numbness and tingling over whole body   Chocolate Other (See Comments)    Triggers seizures   Codeine Nausea And Vomiting   Keppra  [Levetiracetam ] Other (See Comments)    Causes seizures   Lyrica  [Pregabalin ] Nausea And Vomiting   Olive Oil Rash    OBJECTIVE: Vitals:   02/20/24 0000 02/20/24 0200 02/20/24 0722 02/20/24 1115  BP: 116/74 116/62 (!) 95/57 (!) 110/59  Pulse: 88  78   Resp: 18 16 20 20   Temp:   98.9 F (37.2 C) 98.4 F (36.9 C)  TempSrc:   Oral Oral  SpO2: 94%     Weight:      Height:       Body mass index is 20.36 kg/m.  Physical Exam Constitutional:      Appearance: Normal appearance.  HENT:     Head: Normocephalic and atraumatic.     Right Ear: Tympanic membrane normal.     Left Ear: Tympanic membrane normal.     Nose: Nose normal.     Mouth/Throat:     Mouth: Mucous membranes are moist.  Eyes:     Extraocular Movements: Extraocular movements intact.     Conjunctiva/sclera: Conjunctivae normal.     Pupils: Pupils are equal, round, and reactive to light.  Cardiovascular:     Rate and Rhythm: Normal rate and regular rhythm.     Heart sounds: No murmur heard.    No friction rub. No gallop.  Pulmonary:     Breath sounds: Normal breath sounds.  Abdominal:     General: Abdomen is flat.     Palpations: Abdomen is soft.  Musculoskeletal:        General: Normal range of motion.  Skin:    General: Skin is warm and dry.  Neurological:     General: No focal deficit present.     Mental Status: She is alert and oriented to person, place, and time.  Psychiatric:        Mood and Affect: Mood normal.       Lab Results Lab Results   Component Value Date   WBC 4.2 02/20/2024   HGB 9.9 (L) 02/20/2024   HCT 30.5 (L) 02/20/2024   MCV 102.7 (H) 02/20/2024   PLT 188 02/20/2024    Lab Results  Component Value Date   CREATININE 0.58 02/20/2024   BUN <5 (L) 02/20/2024   NA 137 02/20/2024   K 3.1 (L) 02/20/2024   CL 102 02/20/2024   CO2 26 02/20/2024    Lab Results  Component Value Date   ALT 19 02/19/2024   AST 21 02/19/2024   ALKPHOS 61 02/19/2024   BILITOT 0.4 02/19/2024      Imaging: Reviewed 7/16 mri brain No acute abnormality Nonspecific hemispheric encephalomalacia  Constance ONEIDA Passer, MD [facility] 02/20/2024, 3:34  PM

## 2024-02-20 NOTE — Progress Notes (Addendum)
 PROGRESS NOTE        PATIENT DETAILS Name: [name] Age: 68 y.o. Sex: female Date of Birth: [birth year] Admit Date: 02/15/2024 Admitting Physician Camila DELENA Ned, MD ERE:Yjbzd, Anthony RAMAN, FNP  Brief Summary: Patient is a 68 y.o.  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  Per H&P-family concerned that she has had possible breakthrough seizures at home.  Subsequently admitted to the hospitalist service.  Significant events: 7/15>> admit to [facility]. 7/17>> A-fib RVR with borderline blood pressure-IV amiodarone  started.  Cardiology consulted.  INR> 4, vitamin K  ordered. 7/18>> LP attempted by neurology-unsuccessful  Significant studies: 7/15>> CT head: No acute intracranial abnormality. 7/15>> CT abdomen/pelvis: No acute intracranial abnormality. 7/15>> EEG: No definite seizures-with evidence of epileptogenicity/cortical dysfunction from right hemisphere. 7/16>> MRI brain: No acute intracranial abnormality.  Significant microbiology data: 7/14>> COVID/influenza/RSV PCR: Negative 7/14>> respiratory virus panel: Negative 7/15>> blood culture: No growth. 7/16>> blood culture: No growth 7/17>> COVID PCR: Negative.  Procedures: None  Consults: ID Neurology.  Subjective: Afebrile last night.  Objective: Vitals: Blood pressure (!) 110/59, pulse 78, temperature 98.4 F (36.9 C), temperature source Oral, resp. rate 20, height 5' 7 (1.702 m), weight 59 kg, SpO2 94%.   Exam: Awake/alert No meningeal signs Chest: Clear to auscultation CVS: S1-S2 regular Abdomen: Soft nontender nondistended Extremities: No edema Neurology: Nonfocal.  Pertinent Labs/Radiology:    Latest Ref Rng & Units 02/20/2024    4:43 AM 02/19/2024    3:46 AM 02/18/2024    4:36 AM  CBC  WBC 4.0 - 10.5 K/uL 4.2  5.2  5.6   Hemoglobin 12.0 - 15.0 g/dL 9.9  9.9   89.5   Hematocrit 36.0 - 46.0 % 30.5  29.7  31.2   Platelets 150 - 400 K/uL 188  178  166     Lab Results  Component Value Date   NA 137 02/20/2024   K 3.1 (L) 02/20/2024   CL 102 02/20/2024   CO2 26 02/20/2024     Assessment/Plan: Sepsis Suspicion for meningitis/encephalitis-as no other source apparent. Initially-LP not able to be done as INR was supratherapeutic-after culture data started coming back negative-patient was given vitamin K -once INR was acceptable-neurology attempted bedside LP on 7/18-unsuccessful-scheduled for fluoroscopy guided LP later today.  Remains on empiric Rocephin /vancomycin /ampicillin /acyclovir .  Possible breakthrough seizure Seizure threshold probably lowered due to sepsis physiology-and missing several doses of AEDs. EEG results as above-likely reflecting prior ICH Remains on phenytoin /Vimpat  Neurology following.  Acute metabolic encephalopathy Probably due to combination of sepsis physiology/possible breakthrough seizures Encephalopathy has more or less resolved-she is much more awake and alert today compared to the past several days. EEG as above MRI brain negative for any significant abnormalities.  PAF with RVR Occurred on 7/17-required amiodarone  infusion-subsequently quickly flipped back into sinus rhythm Per cardiology-to amiodarone  after 24 hours.  No need to transition to oral amiodarone . Maintaining sinus sinus rhythm As noted above-INR/Coumadin  reversed with due to need for LP Once LP completed-will resume anticoagulation again.  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  Mild extrahepatic biliary ductal dilatation seen incidentally on CT abdomen This is a known history of-has been worked up in the past-MRI abdomen in January 2025-no choledocholithiasis-LFTs are stable-she does not have abdominal pain.  Suspect this is an incidental and chronic finding.  History of breast  cancer Continue anastrozole   Thrombocytopenia Mild Chronic issue Follow-up CBC periodically  Hypokalemia Replete/recheck  Code status:   Code Status: Full Code   DVT Prophylaxis:SCD's   Family Communication:Daughter [phone] updated 7/18   Disposition Plan: Status is: Inpatient Remains inpatient appropriate because: Severity of illness   Planned Discharge Destination:Home health   Diet: Diet Order             Diet regular Room service appropriate? Yes; Fluid consistency: Thin  Diet effective now                     Antimicrobial agents: Anti-infectives (From admission, onward)    Start     Dose/Rate Route Frequency Ordered Stop   02/19/24 1300  doxycycline  (VIBRA -TABS) tablet 100 mg        100 mg Oral Every 12 hours 02/19/24 1214     02/18/24 0600  ampicillin  (OMNIPEN) 1 g in sodium chloride  0.9 % 100 mL IVPB  Status:  Discontinued        1 g 300 mL/hr over 20 Minutes Intravenous Every 6 hours 02/18/24 0430 02/18/24 0502   02/18/24 0600  ampicillin  (OMNIPEN) 2 g in sodium chloride  0.9 % 100 mL IVPB        2 g 300 mL/hr over 20 Minutes Intravenous Every 4 hours 02/18/24 0502     02/18/24 0545  acyclovir  (ZOVIRAX ) 590 mg in dextrose  5 % 100 mL IVPB        10 mg/kg  59 kg 111.8 mL/hr over 60 Minutes Intravenous Every 8 hours 02/18/24 0458     02/18/24 0030  vancomycin  (VANCOREADY) IVPB 750 mg/150 mL        750 mg 150 mL/hr over 60 Minutes Intravenous Every 12 hours 02/17/24 1116     02/17/24 1230  vancomycin  (VANCOCIN ) IVPB 1000 mg/200 mL premix        1,000 mg 200 mL/hr over 60 Minutes Intravenous  Once 02/17/24 1116 02/17/24 1312   02/17/24 1200  cefTRIAXone  (ROCEPHIN ) 2 g in sodium chloride  0.9 % 100 mL IVPB        2 g 200 mL/hr over 30 Minutes Intravenous Every 12 hours 02/17/24 1112     02/17/24 1000  cefTRIAXone  (ROCEPHIN ) 2 g in sodium chloride  0.9 % 100 mL IVPB  Status:  Discontinued        2 g 200 mL/hr over 30 Minutes Intravenous  Daily 02/16/24 0548 02/17/24 1112   02/16/24 0545  cefTRIAXone  (ROCEPHIN ) 2 g in sodium chloride  0.9 % 100 mL IVPB        2 g 200 mL/hr over 30 Minutes Intravenous  Once 02/16/24 0540 02/16/24 0630        MEDICATIONS: Scheduled Meds:  anastrozole   1 mg Oral Daily   Chlorhexidine  Gluconate Cloth  6 each Topical Daily   doxycycline   100 mg Oral Q12H   folic acid   1 mg Oral Daily   lacosamide   200 mg Oral BID   phenytoin   130 mg Oral BID   potassium chloride   60 mEq Oral BID   [START ON 02/21/2024] potassium chloride   60 mEq Oral Daily   sodium chloride  flush  10-40 mL Intracatheter Q12H   sodium chloride  flush  3 mL Intravenous Q12H   Warfarin - Pharmacist Dosing Inpatient   Does not apply q1600   Continuous Infusions:  acyclovir  (ZOVIRAX ) 590 mg in dextrose  5 % 100 mL  IVPB 590 mg (02/20/24 0550)   amiodarone  30 mg/hr (02/20/24 0916)   ampicillin  (OMNIPEN) IV 2 g (02/20/24 1049)   cefTRIAXone  (ROCEPHIN )  IV 2 g (02/20/24 1045)   lactated ringers      vancomycin  750 mg (02/20/24 1156)   PRN Meds:.acetaminophen  **OR** acetaminophen , albuterol , butalbital -acetaminophen -caffeine , LORazepam , ondansetron  **OR** ondansetron  (ZOFRAN ) IV, sodium chloride  flush, sodium chloride  flush   I have personally reviewed following labs and imaging studies  LABORATORY DATA: CBC: Recent Labs  Lab 02/15/24 2248 02/17/24 0401 02/18/24 0436 02/19/24 0346 02/20/24 0443  WBC 5.9 4.8 5.6 5.2 4.2  HGB 13.5 9.8* 10.4* 9.9* 9.9*  HCT 40.1 30.0* 31.2* 29.7* 30.5*  MCV 100.0 102.0* 101.3* 102.4* 102.7*  PLT 112* 125* 166 178 188    Basic Metabolic Panel: Recent Labs  Lab 02/16/24 0043 02/16/24 0630 02/17/24 0401 02/18/24 0436 02/19/24 0346 02/20/24 0443  NA 134*  --  137 136 139 137  K 3.1*  --  3.3* 3.3* 3.7 3.1*  CL 102  --  103 102 106 102  CO2 20*  --  24 24 25 26   GLUCOSE 144*  --  111* 109* 121* 132*  BUN 15  --  <5* 5* <5* <5*  CREATININE 0.81  --  0.62 0.61 0.82 0.58  CALCIUM   8.3*  --  7.8* 8.1* 8.1* 8.2*  MG  --  2.1  --  1.8  --   --     GFR: Estimated Creatinine Clearance: 65.3 mL/min (by C-G formula based on SCr of 0.58 mg/dL).  Liver Function Tests: Recent Labs  Lab 02/16/24 0043 02/18/24 0436 02/19/24 0346  AST 41 23 21  ALT 27 20 19   ALKPHOS 68 61 61  BILITOT 1.1 0.7 0.4  PROT 6.1* 5.5* 5.3*  ALBUMIN 2.7* 2.0* 1.9*   No results for input(s): LIPASE, AMYLASE in the last 168 hours. Recent Labs  Lab 02/17/24 0401  AMMONIA 26    Coagulation Profile: Recent Labs  Lab 02/16/24 0424 02/17/24 0401 02/18/24 0436 02/19/24 0346 02/20/24 0443  INR 5.4* 5.3* 4.3* 1.4* 1.2    Cardiac Enzymes: No results for input(s): CKTOTAL, CKMB, CKMBINDEX, TROPONINI in the last 168 hours.  BNP (last 3 results) No results for input(s): PROBNP in the last 8760 hours.  Lipid Profile: No results for input(s): CHOL, HDL, LDLCALC, TRIG, CHOLHDL, LDLDIRECT in the last 72 hours.  Thyroid  Function Tests: No results for input(s): TSH, T4TOTAL, FREET4, T3FREE, THYROIDAB in the last 72 hours.   Anemia Panel: No results for input(s): VITAMINB12, FOLATE, FERRITIN, TIBC, IRON, RETICCTPCT in the last 72 hours.  Urine analysis:    Component Value Date/Time   COLORURINE AMBER (A) 02/15/2024 2346   APPEARANCEUR HAZY (A) 02/15/2024 2346   LABSPEC 1.021 02/15/2024 2346   PHURINE 5.0 02/15/2024 2346   GLUCOSEU NEGATIVE 02/15/2024 2346   HGBUR SMALL (A) 02/15/2024 2346   BILIRUBINUR SMALL (A) 02/15/2024 2346   KETONESUR NEGATIVE 02/15/2024 2346   PROTEINUR 100 (A) 02/15/2024 2346   UROBILINOGEN 0.2 01/17/2015 2350   NITRITE NEGATIVE 02/15/2024 2346   LEUKOCYTESUR NEGATIVE 02/15/2024 2346    Sepsis Labs: Lactic Acid, Venous    Component Value Date/Time   LATICACIDVEN 0.8 02/16/2024 9366    MICROBIOLOGY: Recent Results (from the past 240 hours)  Resp panel by RT-PCR (RSV, Flu A&B, Covid) Anterior Nasal Swab      Status: None   Collection Time: 02/15/24 11:46 PM   Specimen: Anterior Nasal Swab  Result Value Ref Range Status  SARS Coronavirus 2 by RT PCR NEGATIVE NEGATIVE Final   Influenza A by PCR NEGATIVE NEGATIVE Final   Influenza B by PCR NEGATIVE NEGATIVE Final    Comment: (NOTE) The Xpert Xpress SARS-CoV-2/FLU/RSV plus assay is intended as an aid in the diagnosis of influenza from Nasopharyngeal swab specimens and should not be used as a sole basis for treatment. Nasal washings and aspirates are unacceptable for Xpert Xpress SARS-CoV-2/FLU/RSV testing.  Fact Sheet for Patients: BloggerCourse.com  Fact Sheet for Healthcare Providers: SeriousBroker.it  This test is not yet approved or cleared by the [place]  FDA and has been authorized for detection and/or diagnosis of SARS-CoV-2 by FDA under an Emergency Use Authorization (EUA). This EUA will remain in effect (meaning this test can be used) for the duration of the COVID-19 declaration under Section 564(b)(1) of the Act, 21 U.S.C. section 360bbb-3(b)(1), unless the authorization is terminated or revoked.     Resp Syncytial Virus by PCR NEGATIVE NEGATIVE Final    Comment: (NOTE) Fact Sheet for Patients: BloggerCourse.com  Fact Sheet for Healthcare Providers: SeriousBroker.it  This test is not yet approved or cleared by the [place]  FDA and has been authorized for detection and/or diagnosis of SARS-CoV-2 by FDA under an Emergency Use Authorization (EUA). This EUA will remain in effect (meaning this test can be used) for the duration of the COVID-19 declaration under Section 564(b)(1) of the Act, 21 U.S.C. section 360bbb-3(b)(1), unless the authorization is terminated or revoked.  Performed at [facility], [address], [place], [place] [address]   Respiratory (~20 pathogens) panel by PCR     Status: None    Collection Time: 02/15/24 11:46 PM   Specimen: Nasopharyngeal Swab; Respiratory  Result Value Ref Range Status   Adenovirus NOT DETECTED NOT DETECTED Final   Coronavirus 229E NOT DETECTED NOT DETECTED Final    Comment: (NOTE) The Coronavirus on the Respiratory Panel, DOES NOT test for the novel  Coronavirus (2019 nCoV)    Coronavirus HKU1 NOT DETECTED NOT DETECTED Final   Coronavirus NL63 NOT DETECTED NOT DETECTED Final   Coronavirus OC43 NOT DETECTED NOT DETECTED Final   Metapneumovirus NOT DETECTED NOT DETECTED Final   Rhinovirus / Enterovirus NOT DETECTED NOT DETECTED Final   Influenza A NOT DETECTED NOT DETECTED Final   Influenza B NOT DETECTED NOT DETECTED Final   Parainfluenza Virus 1 NOT DETECTED NOT DETECTED Final   Parainfluenza Virus 2 NOT DETECTED NOT DETECTED Final   Parainfluenza Virus 3 NOT DETECTED NOT DETECTED Final   Parainfluenza Virus 4 NOT DETECTED NOT DETECTED Final   Respiratory Syncytial Virus NOT DETECTED NOT DETECTED Final   Bordetella pertussis NOT DETECTED NOT DETECTED Final   Bordetella Parapertussis NOT DETECTED NOT DETECTED Final   Chlamydophila pneumoniae NOT DETECTED NOT DETECTED Final   Mycoplasma pneumoniae NOT DETECTED NOT DETECTED Final    Comment: Performed at [facility], [address], [place], [place] [address]  MRSA Next Gen by PCR, Nasal     Status: None   Collection Time: 02/16/24  8:40 AM   Specimen: Urine, Clean Catch; Nasal Swab  Result Value Ref Range Status   MRSA by PCR Next Gen NOT DETECTED NOT DETECTED Final    Comment: (NOTE) The GeneXpert MRSA Assay (FDA approved for NASAL specimens only), is one component of a comprehensive MRSA colonization surveillance program. It is not intended to diagnose MRSA infection nor to guide or monitor treatment for MRSA infections. Test performance is not FDA approved  in patients less than 64 years old. Performed at [facility], [address] [place], [place] [address]   Blood  culture (routine x 2)     Status: None (Preliminary result)   Collection Time: 02/16/24  9:33 PM   Specimen: BLOOD  Result Value Ref Range Status   Specimen Description BLOOD SITE NOT SPECIFIED  Final   Special Requests   Final    BOTTLES DRAWN AEROBIC AND ANAEROBIC Blood Culture adequate volume   Culture   Final    NO GROWTH 4 DAYS Performed at [facility], [address], [place], [place] [address]    Report Status PENDING  Incomplete  Blood culture (routine x 2)     Status: None (Preliminary result)   Collection Time: 02/16/24  9:34 PM   Specimen: BLOOD  Result Value Ref Range Status   Specimen Description BLOOD SITE NOT SPECIFIED  Final   Special Requests   Final    BOTTLES DRAWN AEROBIC AND ANAEROBIC Blood Culture adequate volume   Culture   Final    NO GROWTH 4 DAYS Performed at [facility], [address] [place], [place] [address]    Report Status PENDING  Incomplete  Culture, blood (Routine X 2) w Reflex to ID Panel     Status: None (Preliminary result)   Collection Time: 02/17/24 12:10 PM   Specimen: BLOOD RIGHT ARM  Result Value Ref Range Status   Specimen Description BLOOD RIGHT ARM  Final   Special Requests   Final    BOTTLES DRAWN AEROBIC ONLY Blood Culture adequate volume   Culture   Final    NO GROWTH 3 DAYS Performed at [facility], [address], [place], [place] [address]    Report Status PENDING  Incomplete  Culture, blood (Routine X 2) w Reflex to ID Panel     Status: None (Preliminary result)   Collection Time: 02/17/24 12:11 PM   Specimen: BLOOD LEFT HAND  Result Value Ref Range Status   Specimen Description BLOOD LEFT HAND  Final   Special Requests   Final    BOTTLES DRAWN AEROBIC ONLY Blood Culture results may not be optimal due to an inadequate volume of blood received in culture bottles   Culture   Final    NO GROWTH 3 DAYS Performed at [facility], [address], [place], [place] [address]    Report Status  PENDING  Incomplete  SARS Coronavirus 2 by RT PCR (hospital order, performed in [facility] hospital lab) *cepheid single result test* Anterior Nasal Swab     Status: None   Collection Time: 02/18/24  2:10 PM   Specimen: Anterior Nasal Swab  Result Value Ref Range Status   SARS Coronavirus 2 by RT PCR NEGATIVE NEGATIVE Final    Comment: Performed at [facility], [address] [place], [place] [address]    RADIOLOGY STUDIES/RESULTS: No results found.    LOS: 4 days   Donalda Applebaum, MD  [facility]    To contact the attending provider between 7A-7P or the covering provider during after hours 7P-7A, please log into the web site [email or web address] and access using universal [place] password for that web site. If you do not have the password, please call the hospital operator.  02/20/2024, 1:59 PM

## 2024-02-20 NOTE — Procedures (Addendum)
 PROCEDURE SUMMARY:  Successful fluoroscopic guided lumbar puncture at the level of L5-S1.  Opening pressure was 18 cm H2O ~15 mL clear colorless fluid collected and sent for labs.  No immediate complications.  Pt tolerated well.   EBL = trace  Please see full dictation in imaging section of Epic for procedure details.   Electronically Signed: Syeda Prickett M Philipp Callegari, PA-C 02/20/2024, 1:43 PM

## 2024-02-21 DIAGNOSIS — G9340 Encephalopathy, unspecified: Secondary | ICD-10-CM | POA: Diagnosis not present

## 2024-02-21 DIAGNOSIS — R6520 Severe sepsis without septic shock: Secondary | ICD-10-CM | POA: Diagnosis not present

## 2024-02-21 DIAGNOSIS — R509 Fever, unspecified: Secondary | ICD-10-CM

## 2024-02-21 DIAGNOSIS — R519 Headache, unspecified: Secondary | ICD-10-CM

## 2024-02-21 DIAGNOSIS — G40909 Epilepsy, unspecified, not intractable, without status epilepticus: Secondary | ICD-10-CM | POA: Diagnosis not present

## 2024-02-21 DIAGNOSIS — A419 Sepsis, unspecified organism: Secondary | ICD-10-CM | POA: Diagnosis not present

## 2024-02-21 LAB — BASIC METABOLIC PANEL WITH GFR
Anion gap: 9 (ref 5–15)
BUN: <5 mg/dL — ABNORMAL LOW (ref 8–23)
CO2: 25 mmol/L (ref 22–32)
Calcium: 8.5 mg/dL — ABNORMAL LOW (ref 8.9–10.3)
Chloride: 104 mmol/L (ref 98–111)
Creatinine, Ser: 0.63 mg/dL (ref 0.44–1.00)
GFR, Estimated: >60 mL/min (ref >60)
Glucose, Bld: 109 mg/dL — ABNORMAL HIGH (ref 70–99)
Potassium: 3.4 mmol/L — ABNORMAL LOW (ref 3.5–5.1)
Sodium: 138 mmol/L (ref 135–145)

## 2024-02-21 LAB — CULTURE, BLOOD (ROUTINE X 2)
Culture: NO GROWTH
Culture: NO GROWTH
Special Requests: ADEQUATE
Special Requests: ADEQUATE

## 2024-02-21 LAB — HIV ANTIBODY (ROUTINE TESTING W REFLEX): HIV Screen 4th Generation wRfx: NONREACTIVE

## 2024-02-21 LAB — CBC
HCT: 30.3 % — ABNORMAL LOW (ref 36.0–46.0)
Hemoglobin: 10 g/dL — ABNORMAL LOW (ref 12.0–15.0)
MCH: 33.7 pg (ref 26.0–34.0)
MCHC: 33 g/dL (ref 30.0–36.0)
MCV: 102 fL — ABNORMAL HIGH (ref 80.0–100.0)
Platelets: 223 K/uL (ref 150–400)
RBC: 2.97 MIL/uL — ABNORMAL LOW (ref 3.87–5.11)
RDW: 12.6 % (ref 11.5–15.5)
WBC: 3.9 K/uL — ABNORMAL LOW (ref 4.0–10.5)
nRBC: 0 % (ref 0.0–0.2)

## 2024-02-21 LAB — PROTIME-INR
INR: 1.3 — ABNORMAL HIGH (ref 0.8–1.2)
Prothrombin Time: 16.9 s — ABNORMAL HIGH (ref 11.4–15.2)

## 2024-02-21 MED ORDER — WARFARIN SODIUM 10 MG PO TABS: 10.0000 mg | ORAL_TABLET | Freq: Once | ORAL | Status: DC

## 2024-02-21 MED ORDER — POTASSIUM CHLORIDE CRYS ER 20 MEQ PO TBCR
40.0000 meq | EXTENDED_RELEASE_TABLET | Freq: Two times a day (BID) | ORAL | Status: DC
  Administered 2024-02-21 – 2024-02-23 (×5): 40 meq via ORAL
  Filled 2024-02-21 (×5): qty 2

## 2024-02-21 MED ORDER — CALCIUM CARBONATE ANTACID 500 MG PO CHEW
1.0000 | CHEWABLE_TABLET | Freq: Three times a day (TID) | ORAL | Status: DC | PRN
  Administered 2024-02-21: 200 mg via ORAL
  Filled 2024-02-21: qty 1

## 2024-02-21 MED ORDER — ENOXAPARIN SODIUM 60 MG/0.6ML IJ SOSY
60.0000 mg | PREFILLED_SYRINGE | Freq: Two times a day (BID) | INTRAMUSCULAR | Status: DC
  Administered 2024-02-21 – 2024-02-23 (×5): 60 mg via SUBCUTANEOUS
  Filled 2024-02-21 (×5): qty 0.6

## 2024-02-21 NOTE — Progress Notes (Signed)
 NEUROLOGY CONSULT FOLLOW UP NOTE   Date of service: February 21, 2024 Patient Name: [name] MRN:  [id number] DOB:  1956-04-13  Interval Hx/subjective   No acute events, patient has no new neurologic complaints  Vitals   Vitals:   02/21/24 0400 02/21/24 0859 02/21/24 1122 02/21/24 1616  BP: 106/64 (!) 100/58 (!) 109/57 92/75  Pulse: 74     Resp: (!) 24     Temp: 98.3 F (36.8 C) 98.4 F (36.9 C) 99.1 F (37.3 C) 98.8 F (37.1 C)  TempSrc:  Oral Oral Oral  SpO2: 96%     Weight:      Height:         Body mass index is 20.36 kg/m.  Physical Exam   Gen: patient lying in bed, NAD CV: extremities appear well-perfused Resp: normal WOB  Neurologic exam MS: alert, oriented x4, follows commands Speech: no dysarthria, no aphasia CN: PERRL, left homonymous hemianopia, EOMI, sensation intact, face symmetric, hearing intact to voice Motor: 5/5 strength throughout Sensory: SILT Reflexes: 2+ symm with toes down bilat Coordination: FNF intact bilat Gait: deferred   Medications  Current Facility-Administered Medications:    acetaminophen  (TYLENOL ) tablet 650 mg, 650 mg, Oral, Q6H PRN, 650 mg at 02/21/24 1019 **OR** acetaminophen  (TYLENOL ) suppository 650 mg, 650 mg, Rectal, Q6H PRN, Claudene, Rondell A, MD   albuterol  (PROVENTIL ) (2.5 MG/3ML) 0.083% nebulizer solution 2.5 mg, 2.5 mg, Nebulization, Q6H PRN, Claudene, Rondell A, MD   anastrozole  (ARIMIDEX ) tablet 1 mg, 1 mg, Oral, Daily, Smith, Rondell A, MD, 1 mg at 02/21/24 9042   butalbital -acetaminophen -caffeine  (FIORICET ) 50-325-40 MG per tablet 1 tablet, 1 tablet, Oral, Q6H PRN, Claudene Reeves A, MD, 1 tablet at 02/17/24 1103   calcium  carbonate (TUMS - dosed in mg elemental calcium ) chewable tablet 200 mg of elemental calcium , 1 tablet, Oral, TID PRN, Ghimire, Shanker M, MD, 200 mg of elemental calcium  at 02/21/24 1802   Chlorhexidine  Gluconate Cloth 2 % PADS 6 each, 6 each, Topical, Daily, Ghimire, Donalda HERO, MD, 6 each at  02/21/24 0958   enoxaparin  (LOVENOX ) injection 60 mg, 60 mg, Subcutaneous, Q12H, Pham, Minh Q, RPH-CPP, 60 mg at 02/21/24 9041   folic acid  (FOLVITE ) tablet 1 mg, 1 mg, Oral, Daily, Claudene, Rondell A, MD, 1 mg at 02/20/24 1051   lacosamide  (VIMPAT ) tablet 200 mg, 200 mg, Oral, BID, Debby Hitch A, MD, 200 mg at 02/21/24 9041   LORazepam  (ATIVAN ) injection 2 mg, 2 mg, Intravenous, Q5 min PRN, Debby Hitch LABOR, MD   ondansetron  (ZOFRAN ) tablet 4 mg, 4 mg, Oral, Q6H PRN, 4 mg at 02/16/24 1520 **OR** ondansetron  (ZOFRAN ) injection 4 mg, 4 mg, Intravenous, Q6H PRN, Claudene, Rondell A, MD   phenytoin  (DILANTIN ) ER capsule 130 mg, 130 mg, Oral, BID, Debby Hitch A, MD, 130 mg at 02/21/24 0957   potassium chloride  SA (KLOR-CON  M) CR tablet 40 mEq, 40 mEq, Oral, BID, Ghimire, Shanker M, MD, 40 mEq at 02/21/24 1425   sodium chloride  flush (NS) 0.9 % injection 10-40 mL, 10-40 mL, Intracatheter, PRN, Pollina, Lonni PARAS, MD   sodium chloride  flush (NS) 0.9 % injection 10-40 mL, 10-40 mL, Intracatheter, Q12H, Ghimire, Shanker M, MD, 10 mL at 02/20/24 2105   sodium chloride  flush (NS) 0.9 % injection 10-40 mL, 10-40 mL, Intracatheter, PRN, Ghimire, Donalda HERO, MD   sodium chloride  flush (NS) 0.9 % injection 3 mL, 3 mL, Intravenous, Q12H, Smith, Rondell A, MD, 3 mL at 02/20/24 2105  Labs and Diagnostic Imaging  CBC:  Recent Labs  Lab 02/20/24 0443 02/21/24 0522  WBC 4.2 3.9*  HGB 9.9* 10.0*  HCT 30.5* 30.3*  MCV 102.7* 102.0*  PLT 188 223    Basic Metabolic Panel:  Lab Results  Component Value Date   NA 138 02/21/2024   K 3.4 (L) 02/21/2024   CO2 25 02/21/2024   GLUCOSE 109 (H) 02/21/2024   BUN <5 (L) 02/21/2024   CREATININE 0.63 02/21/2024   CALCIUM  8.5 (L) 02/21/2024   GFRNONAA >60 02/21/2024   GFRAA >60 04/24/2020   Lipid Panel:  Lab Results  Component Value Date   LDLCALC 112 (H) 10/10/2020   HgbA1c:  Lab Results  Component Value Date   HGBA1C 5.0 08/02/2020    Urine Drug Screen:     Component Value Date/Time   LABOPIA NONE DETECTED 02/17/2024 1244   COCAINSCRNUR NONE DETECTED 02/17/2024 1244   LABBENZ NONE DETECTED 02/17/2024 1244   AMPHETMU NONE DETECTED 02/17/2024 1244   THCU NONE DETECTED 02/17/2024 1244   LABBARB POSITIVE (A) 02/17/2024 1244    Alcohol Level     Component Value Date/Time   ETH <10 02/09/2021 1519   INR  Lab Results  Component Value Date   INR 1.3 (H) 02/21/2024   APTT  Lab Results  Component Value Date   APTT 75 (H) 02/16/2024   AED levels:  Lab Results  Component Value Date   PHENYTOIN  8.3 (L) 02/16/2024    CT Head without contrast(Personally reviewed): 1. No acute intracranial abnormality. 2. 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  rEEG:  02/16/2024- This study showed evidence of epileptogenicity and cortical dysfunction arising from right hemisphere, maximal right posterior quadrant. This EEG pattern is on the ictal-interictal continuum with potential for seizures.  Additionally there is mild to moderate diffuse encephalopathy.  No definite seizures were noted.   IR LP performed today; results pending  Assessment   [name] is a 68 y.o. 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  She presented febrile, tachycardic in A-fib with RVR, tachypneic, hypotensive, but without hypoxia.  UA showed microscopic hematuria and rare bacteria but no nitrites, leukocytes, or pyuria.  No leukocytosis, no severe electrolyte abnormalities or AKI, full RVP negative, CT head/abdomen/pelvis without acute abnormalities.  Phenytoin  level was subtherapeutic at 8.3 on admission.  She  has a reported history of recurrent seizures often associated with urinary tract infections so she was empirically treated on admission with ceftriaxone .  Since admission vancomycin  was added for recurrent fevers on 02/17/2024 and ampicillin  and acyclovir  were added on 02/18/2024.  She has been unable to undergo lumbar puncture due to supratherapeutic INR but after receiving vitamin K  on 02/18/2024 her INR is 1.4.  Again she fevered overnight.  02/18/2024 morning she went back into A-fib with RVR was placed on amiodarone  drip.  She continues on this with normal rate.  Due to her persistent fevers and headache she was briefly placed on empiric CNS coverage but that was discontinued today after preliminary CSF studies returned normal. Blood cultures negative. ID has recommended stopping all abx.  Neurology was initially consulted 2/2 breakthrough seizure after she did not take her vimpat  for one week. Now that she is back on her home  regimen of vimpat  and phenytoin  she has had no more seizures.  Recommendations   - Continue phenytoin  130 mg twice daily and Vimpat  200 mg twice daily - Appreciate primary team following up on CSF paraneoplastic panel sent 02/20/24 (under misc send-out). Suspicion for autoimmune encephalitis is low. This test may not result prior to hospital discharge. In that case please highlight in discharge summary that this test should be followed up by PCP No further inpatient neurologic workup recommended at this time Patient may f/u with her established outpatient neurologist Dr. Georjean Recommend case management help with pharmacy issues to avoid any future lapses in AED doses. Pt is seizure free when adherent to current medications Seizure precautions   Neurology to sign off, please re-engage if new neurologic concerns arise.  _____________________________________________________________________  Seizure precautions: Per [place]  DMV statutes, patients with seizures are  not allowed to drive until they have been seizure-free for six months and cleared by a physician    Use caution when using heavy equipment or power tools. Avoid working on ladders or at heights. Take showers instead of baths. Ensure the water temperature is not too high on the home water heater. Do not go swimming alone. Do not lock yourself in a room alone (i.e. bathroom). When caring for infants or small children, sit down when holding, feeding, or changing them to minimize risk of injury to the child in the event you have a seizure. Maintain good sleep hygiene. Avoid alcohol.    If patient has another seizure, call 911 and bring them back to the ED if: A.  The seizure lasts longer than 5 minutes.      B.  The patient doesn't wake shortly after the seizure or has new problems such as difficulty seeing, speaking or moving following the seizure C.  The patient was injured during the seizure D.  The patient has a temperature over 102 F (39C) E.  The patient vomited during the seizure and now is having trouble breathing    During the Seizure   - First, ensure adequate ventilation and place patients on the floor on their left side  Loosen clothing around the neck and ensure the airway is patent. If the patient is clenching the teeth, do not force the mouth open with any object as this can cause severe damage - Remove all items from the surrounding that can be hazardous. The patient may be oblivious to what's happening and may not even know what he or she is doing. If the patient is confused and wandering, either gently guide him/her away and block access to outside areas - Reassure the individual and be comforting - Call 911. In most cases, the seizure ends before EMS arrives. However, there are cases when seizures may last over 3 to 5 minutes. Or the individual may have developed breathing difficulties or severe injuries. If a pregnant patient or a person with diabetes develops a seizure, it is  prudent to call an ambulance. - Finally, if the patient does not regain full consciousness, then call EMS. Most patients will remain confused for about 45 to 90 minutes after a seizure, so you must use judgment in calling for help. - Avoid restraints but make sure the patient is in a bed with padded side rails - Place the individual in a lateral position with the neck slightly flexed; this will help the saliva drain from the mouth and prevent the tongue from falling backward - Remove all nearby furniture and other hazards  from the area - Provide verbal assurance as the individual is regaining consciousness - Provide the patient with privacy if possible - Call for help and start treatment as ordered by the caregiver    After the Seizure (Postictal Stage)   After a seizure, most patients experience confusion, fatigue, muscle pain and/or a headache. Thus, one should permit the individual to sleep. For the next few days, reassurance is essential. Being calm and helping reorient the person is also of importance.   Most seizures are painless and end spontaneously. Seizures are not harmful to others but can lead to complications such as stress on the lungs, brain and the heart. Individuals with prior lung problems may develop labored breathing and respiratory distress.  Elida Ross, MD [facility] [phone]  If 7pm- 7am, please page neurology on call as listed in [facility].

## 2024-02-21 NOTE — Progress Notes (Signed)
 PROGRESS NOTE        PATIENT DETAILS Name: [name] Age: 68 y.o. Sex: female Date of Birth: 1956/06/14 Admit Date: 02/15/2024 Admitting Physician Camila DELENA Ned, MD ERE:Yjbzd, Anthony RAMAN, FNP  Brief Summary: Patient is a 68 y.o.  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  Per H&P-family concerned that she has had possible breakthrough seizures at home.  Subsequently admitted to the hospitalist service.  Significant events: 7/15>> admit to [facility]. 7/17>> A-fib RVR with borderline blood pressure-IV amiodarone  started.  Cardiology consulted.  INR> 4, vitamin K  ordered. 7/18>> LP attempted by neurology-unsuccessful  Significant studies: 7/15>> CT head: No acute intracranial abnormality. 7/15>> CT abdomen/pelvis: No acute intracranial abnormality. 7/15>> EEG: No definite seizures-with evidence of epileptogenicity/cortical dysfunction from right hemisphere. 7/16>> MRI brain: No acute intracranial abnormality.  Significant microbiology data: 7/14>> COVID/influenza/RSV PCR: Negative 7/14>> respiratory virus panel: Negative 7/15>> blood culture: No growth. 7/16>> blood culture: No growth 7/17>> COVID PCR: Negative. 7/19>> CSF culture: No blood 7/19>> CSF biofire panel: Negative  Procedures: 7/19>> lumbar puncture.  Consults: ID Neurology.  Subjective: No fever overnight-hardly any headaches.  No nausea, vomiting or diarrhea.  Objective: Vitals: Blood pressure (!) 100/58, pulse 74, temperature 98.4 F (36.9 C), temperature source Oral, resp. rate (!) 24, height 5' 7 (1.702 m), weight 59 kg, SpO2 96%.   Exam:   Pertinent Labs/Radiology:    Latest Ref Rng & Units 02/21/2024    5:22 AM 02/20/2024    4:43 AM 02/19/2024    3:46 AM  CBC  WBC 4.0 - 10.5 K/uL 3.9  4.2  5.2   Hemoglobin 12.0 - 15.0 g/dL 89.9  9.9  9.9   Hematocrit  36.0 - 46.0 % 30.3  30.5  29.7   Platelets 150 - 400 K/uL 223  188  178     Lab Results  Component Value Date   NA 138 02/21/2024   K 3.4 (L) 02/21/2024   CL 104 02/21/2024   CO2 25 02/21/2024     Assessment/Plan: Sepsis Sepsis physiology has resolved-no fever for almost 2 days-empirically covered for meningitis/encephalitis-however CSF analysis is benign.  Unclear foci of sepsis at this point. Autoimmune workup/tickborne panel pending. Discussed with ID MD 7/20-all antimicrobial agents will be discontinued and patient will be monitored off antimicrobial therapy.  Possible breakthrough seizure Seizure threshold probably lowered due to sepsis physiology-and missing several doses of AEDs. EEG results as above-likely reflecting prior ICH Remains on phenytoin /Vimpat  Neurology following.  Acute metabolic encephalopathy Probably due to combination of sepsis physiology/possible breakthrough seizures Encephalopathy has resolved. EEG as above MRI brain negative for any significant abnormalities.  PAF with RVR Occurred on 7/17-required amiodarone  infusion-subsequently quickly flipped back into sinus rhythm Per cardiology-ok to  stop amiodarone  after 24 hours.  No need to transition to oral amiodarone . Maintaining sinus sinus rhythm INR reversed for LP-which was done on 7/19 Will restart Lovenox  but hold off on resuming Coumadin  for now as patient is scheduled for ERCP on 7/24.  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  Mild extrahepatic biliary ductal dilatation seen incidentally on CT abdomen This is a known history of-has been worked up in the past-MRI abdomen in January 2025-no choledocholithiasis-LFTs are stable-she does not have abdominal pain.  Suspect this is an incidental and chronic finding. Discussed with daughter-patient is apparently scheduled for a ERCP on 7/24.  History of breast cancer Continue  anastrozole   Thrombocytopenia Mild Chronic issue Follow-up CBC periodically  Hypokalemia Replete/recheck  Code status:   Code Status: Full Code   DVT Prophylaxis:SCD's warfarin (COUMADIN ) tablet 10 mg    Family Communication:Daughter [id number] updated 7/20   Disposition Plan: Status is: Inpatient Remains inpatient appropriate because: Severity of illness   Planned Discharge Destination:Home health   Diet: Diet Order             Diet regular Room service appropriate? Yes; Fluid consistency: Thin  Diet effective now                     Antimicrobial agents: Anti-infectives (From admission, onward)    Start     Dose/Rate Route Frequency Ordered Stop   02/19/24 1300  doxycycline  (VIBRA -TABS) tablet 100 mg  Status:  Discontinued        100 mg Oral Every 12 hours 02/19/24 1214 02/21/24 1030   02/18/24 0600  ampicillin  (OMNIPEN) 1 g in sodium chloride  0.9 % 100 mL IVPB  Status:  Discontinued        1 g 300 mL/hr over 20 Minutes Intravenous Every 6 hours 02/18/24 0430 02/18/24 0502   02/18/24 0600  ampicillin  (OMNIPEN) 2 g in sodium chloride  0.9 % 100 mL IVPB  Status:  Discontinued        2 g 300 mL/hr over 20 Minutes Intravenous Every 4 hours 02/18/24 0502 02/21/24 1029   02/18/24 0545  acyclovir  (ZOVIRAX ) 590 mg in dextrose  5 % 100 mL IVPB  Status:  Discontinued        10 mg/kg  59 kg 111.8 mL/hr over 60 Minutes Intravenous Every 8 hours 02/18/24 0458 02/21/24 1029   02/18/24 0030  vancomycin  (VANCOREADY) IVPB 750 mg/150 mL  Status:  Discontinued        750 mg 150 mL/hr over 60 Minutes Intravenous Every 12 hours 02/17/24 1116 02/21/24 1029   02/17/24 1230  vancomycin  (VANCOCIN ) IVPB 1000 mg/200 mL premix        1,000 mg 200 mL/hr over 60 Minutes Intravenous  Once 02/17/24 1116 02/17/24 1312   02/17/24 1200  cefTRIAXone  (ROCEPHIN ) 2 g in sodium chloride  0.9 % 100 mL IVPB  Status:  Discontinued        2 g 200 mL/hr over 30 Minutes Intravenous Every 12  hours 02/17/24 1112 02/21/24 1029   02/17/24 1000  cefTRIAXone  (ROCEPHIN ) 2 g in sodium chloride  0.9 % 100 mL IVPB  Status:  Discontinued        2 g 200 mL/hr over 30 Minutes Intravenous Daily 02/16/24 0548 02/17/24 1112   02/16/24 0545  cefTRIAXone  (ROCEPHIN ) 2 g in sodium chloride  0.9 % 100 mL IVPB        2 g 200 mL/hr over 30 Minutes Intravenous  Once 02/16/24 0540 02/16/24 0630        MEDICATIONS: Scheduled Meds:  anastrozole   1 mg Oral Daily   Chlorhexidine  Gluconate Cloth  6 each Topical Daily   enoxaparin  (LOVENOX ) injection  60 mg Subcutaneous Q12H   folic acid   1 mg Oral Daily   lacosamide   200 mg Oral BID   phenytoin   130 mg Oral BID   potassium chloride   60 mEq Oral Daily   sodium chloride  flush  10-40 mL Intracatheter Q12H   sodium chloride  flush  3 mL Intravenous Q12H  warfarin  10 mg Oral ONCE-1600   Warfarin - Pharmacist Dosing Inpatient   Does not apply q1600   Continuous Infusions:   PRN Meds:.acetaminophen  **OR** acetaminophen , albuterol , butalbital -acetaminophen -caffeine , LORazepam , ondansetron  **OR** ondansetron  (ZOFRAN ) IV, sodium chloride  flush, sodium chloride  flush   I have personally reviewed following labs and imaging studies  LABORATORY DATA: CBC: Recent Labs  Lab 02/17/24 0401 02/18/24 0436 02/19/24 0346 02/20/24 0443 02/21/24 0522  WBC 4.8 5.6 5.2 4.2 3.9*  HGB 9.8* 10.4* 9.9* 9.9* 10.0*  HCT 30.0* 31.2* 29.7* 30.5* 30.3*  MCV 102.0* 101.3* 102.4* 102.7* 102.0*  PLT 125* 166 178 188 223    Basic Metabolic Panel: Recent Labs  Lab 02/16/24 0630 02/17/24 0401 02/18/24 0436 02/19/24 0346 02/20/24 0443 02/21/24 0522  NA  --  137 136 139 137 138  K  --  3.3* 3.3* 3.7 3.1* 3.4*  CL  --  103 102 106 102 104  CO2  --  24 24 25 26 25   GLUCOSE  --  111* 109* 121* 132* 109*  BUN  --  <5* 5* <5* <5* <5*  CREATININE  --  0.62 0.61 0.82 0.58 0.63  CALCIUM   --  7.8* 8.1* 8.1* 8.2* 8.5*  MG 2.1  --  1.8  --   --   --      GFR: Estimated Creatinine Clearance: 65.3 mL/min (by C-G formula based on SCr of 0.63 mg/dL).  Liver Function Tests: Recent Labs  Lab 02/16/24 0043 02/18/24 0436 02/19/24 0346  AST 41 23 21  ALT 27 20 19   ALKPHOS 68 61 61  BILITOT 1.1 0.7 0.4  PROT 6.1* 5.5* 5.3*  ALBUMIN 2.7* 2.0* 1.9*   No results for input(s): LIPASE, AMYLASE in the last 168 hours. Recent Labs  Lab 02/17/24 0401  AMMONIA 26    Coagulation Profile: Recent Labs  Lab 02/17/24 0401 02/18/24 0436 02/19/24 0346 02/20/24 0443 02/21/24 0522  INR 5.3* 4.3* 1.4* 1.2 1.3*    Cardiac Enzymes: No results for input(s): CKTOTAL, CKMB, CKMBINDEX, TROPONINI in the last 168 hours.  BNP (last 3 results) No results for input(s): PROBNP in the last 8760 hours.  Lipid Profile: No results for input(s): CHOL, HDL, LDLCALC, TRIG, CHOLHDL, LDLDIRECT in the last 72 hours.  Thyroid  Function Tests: No results for input(s): TSH, T4TOTAL, FREET4, T3FREE, THYROIDAB in the last 72 hours.   Anemia Panel: No results for input(s): VITAMINB12, FOLATE, FERRITIN, TIBC, IRON, RETICCTPCT in the last 72 hours.  Urine analysis:    Component Value Date/Time   COLORURINE AMBER (A) 02/15/2024 2346   APPEARANCEUR HAZY (A) 02/15/2024 2346   LABSPEC 1.021 02/15/2024 2346   PHURINE 5.0 02/15/2024 2346   GLUCOSEU NEGATIVE 02/15/2024 2346   HGBUR SMALL (A) 02/15/2024 2346   BILIRUBINUR SMALL (A) 02/15/2024 2346   KETONESUR NEGATIVE 02/15/2024 2346   PROTEINUR 100 (A) 02/15/2024 2346   UROBILINOGEN 0.2 01/17/2015 2350   NITRITE NEGATIVE 02/15/2024 2346   LEUKOCYTESUR NEGATIVE 02/15/2024 2346    Sepsis Labs: Lactic Acid, Venous    Component Value Date/Time   LATICACIDVEN 0.8 02/16/2024 9366    MICROBIOLOGY: Recent Results (from the past 240 hours)  Resp panel by RT-PCR (RSV, Flu A&B, Covid) Anterior Nasal Swab     Status: None   Collection Time: 02/15/24 11:46 PM    Specimen: Anterior Nasal Swab  Result Value Ref Range Status   SARS Coronavirus 2 by RT PCR NEGATIVE NEGATIVE Final   Influenza A by PCR NEGATIVE NEGATIVE Final  Influenza B by PCR NEGATIVE NEGATIVE Final    Comment: (NOTE) The Xpert Xpress SARS-CoV-2/FLU/RSV plus assay is intended as an aid in the diagnosis of influenza from Nasopharyngeal swab specimens and should not be used as a sole basis for treatment. Nasal washings and aspirates are unacceptable for Xpert Xpress SARS-CoV-2/FLU/RSV testing.  Fact Sheet for Patients: BloggerCourse.com  Fact Sheet for Healthcare Providers: SeriousBroker.it  This test is not yet approved or cleared by the [place]  FDA and has been authorized for detection and/or diagnosis of SARS-CoV-2 by FDA under an Emergency Use Authorization (EUA). This EUA will remain in effect (meaning this test can be used) for the duration of the COVID-19 declaration under Section 564(b)(1) of the Act, 21 U.S.C. section 360bbb-3(b)(1), unless the authorization is terminated or revoked.     Resp Syncytial Virus by PCR NEGATIVE NEGATIVE Final    Comment: (NOTE) Fact Sheet for Patients: BloggerCourse.com  Fact Sheet for Healthcare Providers: SeriousBroker.it  This test is not yet approved or cleared by the [place]  FDA and has been authorized for detection and/or diagnosis of SARS-CoV-2 by FDA under an Emergency Use Authorization (EUA). This EUA will remain in effect (meaning this test can be used) for the duration of the COVID-19 declaration under Section 564(b)(1) of the Act, 21 U.S.C. section 360bbb-3(b)(1), unless the authorization is terminated or revoked.  Performed at [facility], [address] [place], [place] [address]   Respiratory (~20 pathogens) panel by PCR     Status: None   Collection Time: 02/15/24 11:46 PM   Specimen:  Nasopharyngeal Swab; Respiratory  Result Value Ref Range Status   Adenovirus NOT DETECTED NOT DETECTED Final   Coronavirus 229E NOT DETECTED NOT DETECTED Final    Comment: (NOTE) The Coronavirus on the Respiratory Panel, DOES NOT test for the novel  Coronavirus (2019 nCoV)    Coronavirus HKU1 NOT DETECTED NOT DETECTED Final   Coronavirus NL63 NOT DETECTED NOT DETECTED Final   Coronavirus OC43 NOT DETECTED NOT DETECTED Final   Metapneumovirus NOT DETECTED NOT DETECTED Final   Rhinovirus / Enterovirus NOT DETECTED NOT DETECTED Final   Influenza A NOT DETECTED NOT DETECTED Final   Influenza B NOT DETECTED NOT DETECTED Final   Parainfluenza Virus 1 NOT DETECTED NOT DETECTED Final   Parainfluenza Virus 2 NOT DETECTED NOT DETECTED Final   Parainfluenza Virus 3 NOT DETECTED NOT DETECTED Final   Parainfluenza Virus 4 NOT DETECTED NOT DETECTED Final   Respiratory Syncytial Virus NOT DETECTED NOT DETECTED Final   Bordetella pertussis NOT DETECTED NOT DETECTED Final   Bordetella Parapertussis NOT DETECTED NOT DETECTED Final   Chlamydophila pneumoniae NOT DETECTED NOT DETECTED Final   Mycoplasma pneumoniae NOT DETECTED NOT DETECTED Final    Comment: Performed at [facility], [address], [place], [place] [address]  MRSA Next Gen by PCR, Nasal     Status: None   Collection Time: 02/16/24  8:40 AM   Specimen: Urine, Clean Catch; Nasal Swab  Result Value Ref Range Status   MRSA by PCR Next Gen NOT DETECTED NOT DETECTED Final    Comment: (NOTE) The GeneXpert MRSA Assay (FDA approved for NASAL specimens only), is one component of a comprehensive MRSA colonization surveillance program. It is not intended to diagnose MRSA infection nor to guide or monitor treatment for MRSA infections. Test performance is not FDA approved in patients less than 28 years old. Performed at [facility], [address] [place], [place] [address]  Blood culture (routine x 2)     Status: None    Collection Time: 02/16/24  9:33 PM   Specimen: BLOOD  Result Value Ref Range Status   Specimen Description BLOOD SITE NOT SPECIFIED  Final   Special Requests   Final    BOTTLES DRAWN AEROBIC AND ANAEROBIC Blood Culture adequate volume   Culture   Final    NO GROWTH 5 DAYS Performed at [facility], [address] [place], [place] [address]    Report Status 02/21/2024 FINAL  Final  Blood culture (routine x 2)     Status: None   Collection Time: 02/16/24  9:34 PM   Specimen: BLOOD  Result Value Ref Range Status   Specimen Description BLOOD SITE NOT SPECIFIED  Final   Special Requests   Final    BOTTLES DRAWN AEROBIC AND ANAEROBIC Blood Culture adequate volume   Culture   Final    NO GROWTH 5 DAYS Performed at [facility], [address] [place], [place] [address]    Report Status 02/21/2024 FINAL  Final  Culture, blood (Routine X 2) w Reflex to ID Panel     Status: None (Preliminary result)   Collection Time: 02/17/24 12:10 PM   Specimen: BLOOD RIGHT ARM  Result Value Ref Range Status   Specimen Description BLOOD RIGHT ARM  Final   Special Requests   Final    BOTTLES DRAWN AEROBIC ONLY Blood Culture adequate volume   Culture   Final    NO GROWTH 4 DAYS Performed at [facility], [address] [place], [place] [address]    Report Status PENDING  Incomplete  Culture, blood (Routine X 2) w Reflex to ID Panel     Status: None (Preliminary result)   Collection Time: 02/17/24 12:11 PM   Specimen: BLOOD LEFT HAND  Result Value Ref Range Status   Specimen Description BLOOD LEFT HAND  Final   Special Requests   Final    BOTTLES DRAWN AEROBIC ONLY Blood Culture results may not be optimal due to an inadequate volume of blood received in culture bottles   Culture   Final    NO GROWTH 4 DAYS Performed at [facility], [address] [place], [place] [address]    Report Status PENDING  Incomplete  SARS Coronavirus 2 by RT PCR (hospital order, performed in  [facility] hospital lab) *cepheid single result test* Anterior Nasal Swab     Status: None   Collection Time: 02/18/24  2:10 PM   Specimen: Anterior Nasal Swab  Result Value Ref Range Status   SARS Coronavirus 2 by RT PCR NEGATIVE NEGATIVE Final    Comment: Performed at [facility], [address] [place], [place] [address]  CSF culture w Gram Stain     Status: None (Preliminary result)   Collection Time: 02/19/24  2:07 PM   Specimen: CSF; Cerebrospinal Fluid  Result Value Ref Range Status   Specimen Description CSF  Final   Special Requests LUMBAR PUNCTURE  Final   Gram Stain NO WBC SEEN NO ORGANISMS SEEN CYTOSPIN SMEAR   Final   Culture   Final    NO GROWTH < 24 HOURS Performed at [facility], [address], [place], [place] [address]    Report Status PENDING  Incomplete    RADIOLOGY STUDIES/RESULTS: DG FL GUIDED LUMBAR PUNCTURE Result Date: 02/20/2024 CLINICAL DATA:  68 year old female with a history of seizures who  presented to the ED with fever, confusion, and headaches. Request for lumbar puncture. EXAM: LUMBAR PUNCTURE UNDER FLUOROSCOPY PROCEDURE: An appropriate skin entry site was determined fluoroscopically. Operator donned sterile gloves and mask. Skin site was marked, then prepped with Betadine, draped in usual sterile fashion, and infiltrated locally with 1% lidocaine . A 20 gauge spinal needle advanced into the thecal sac at L5-S1 from a left interlaminar approach. Clear colorless CSF spontaneously returned, with opening pressure of 18 cm water. 15 ml CSF were collected and divided among 4 sterile vials for the requested laboratory studies. The needle was then removed. The patient tolerated the procedure well and there were no complications. FLUOROSCOPY: Radiation Exposure Index (as provided by the fluoroscopic device): 26.5 mGy Kerma IMPRESSION: Technically successful lumbar puncture under fluoroscopy. This exam was performed by Sherrilee Bal, PA-C, and was  supervised and interpreted by Dr. DELENA Balder. Electronically Signed   By: Juliene Balder M.D.   On: 02/20/2024 14:45      LOS: 5 days   [name] Applebaum, MD  [facility]    To contact the attending provider between 7A-7P or the covering provider during after hours 7P-7A, please log into the web site [email or web address] and access using universal [place] password for that web site. If you do not have the password, please call the hospital operator.  02/21/2024, 10:30 AM

## 2024-02-21 NOTE — Progress Notes (Signed)
 Pt has been on coumadin  for a hx of DVT/afib. Coumadin  has been on hold for upcoming ERCP. MD wants to bridge with full dose Lovenox .  Lovenox  60mg  SQ BID  Sergio Batch, PharmD, [place], AAHIVP, CPP Infectious Disease Pharmacist 02/21/2024 10:46 AM

## 2024-02-21 NOTE — Progress Notes (Signed)
 Id brief note   Csf study normal Blood cx negative Fever unclear if nonspecific nonprimary cns viral infection Brain MRI no acute abnormality  Clinically much improved and fever resolved  Can stop all abx Discussed with primary team

## 2024-02-21 NOTE — Plan of Care (Signed)

## 2024-02-21 NOTE — Plan of Care (Signed)
  Problem: Education: Goal: Knowledge of General Education information will improve Description: Including pain rating scale, medication(s)/side effects and non-pharmacologic comfort measures Outcome: Progressing   Problem: Health Behavior/Discharge Planning: Goal: Ability to manage health-related needs will improve Outcome: Progressing   Problem: Clinical Measurements: Goal: Ability to maintain clinical measurements within normal limits will improve Outcome: Progressing Goal: Will remain free from infection Outcome: Progressing Goal: Diagnostic test results will improve Outcome: Progressing Goal: Respiratory complications will improve Outcome: Progressing Goal: Cardiovascular complication will be avoided Outcome: Progressing   Problem: Activity: Goal: Risk for activity intolerance will decrease Outcome: Progressing    Problem: Elimination: Goal: Will not experience complications related to bowel motility Outcome: Progressing     

## 2024-02-22 ENCOUNTER — Inpatient Hospital Stay (HOSPITAL_COMMUNITY)

## 2024-02-22 DIAGNOSIS — A419 Sepsis, unspecified organism: Secondary | ICD-10-CM | POA: Diagnosis not present

## 2024-02-22 DIAGNOSIS — R6520 Severe sepsis without septic shock: Secondary | ICD-10-CM | POA: Diagnosis not present

## 2024-02-22 LAB — CBC
HCT: 31.8 % — ABNORMAL LOW (ref 36.0–46.0)
Hemoglobin: 10.5 g/dL — ABNORMAL LOW (ref 12.0–15.0)
MCH: 34 pg (ref 26.0–34.0)
MCHC: 33 g/dL (ref 30.0–36.0)
MCV: 102.9 fL — ABNORMAL HIGH (ref 80.0–100.0)
Platelets: 275 K/uL (ref 150–400)
RBC: 3.09 MIL/uL — ABNORMAL LOW (ref 3.87–5.11)
RDW: 12.6 % (ref 11.5–15.5)
WBC: 4.3 K/uL (ref 4.0–10.5)
nRBC: 0 % (ref 0.0–0.2)

## 2024-02-22 LAB — SPOTTED FEVER GROUP ANTIBODIES
Spotted Fever Group IgG: <1:64 {titer}
Spotted Fever Group IgM: <1:64 {titer}

## 2024-02-22 LAB — MAGNESIUM: Magnesium: 1.9 mg/dL (ref 1.7–2.4)

## 2024-02-22 LAB — CULTURE, BLOOD (ROUTINE X 2)
Culture: NO GROWTH
Culture: NO GROWTH
Special Requests: ADEQUATE

## 2024-02-22 LAB — ACID FAST SMEAR (AFB, MYCOBACTERIA): Acid Fast Smear: NEGATIVE

## 2024-02-22 LAB — CYCLIC CITRUL PEPTIDE ANTIBODY, IGG/IGA: CCP Antibodies IgG/IgA: <1 U (ref 0–19)

## 2024-02-22 LAB — BASIC METABOLIC PANEL WITH GFR
Anion gap: 10 (ref 5–15)
BUN: 7 mg/dL — ABNORMAL LOW (ref 8–23)
CO2: 25 mmol/L (ref 22–32)
Calcium: 8.9 mg/dL (ref 8.9–10.3)
Chloride: 103 mmol/L (ref 98–111)
Creatinine, Ser: 0.7 mg/dL (ref 0.44–1.00)
GFR, Estimated: >60 mL/min (ref >60)
Glucose, Bld: 102 mg/dL — ABNORMAL HIGH (ref 70–99)
Potassium: 4 mmol/L (ref 3.5–5.1)
Sodium: 138 mmol/L (ref 135–145)

## 2024-02-22 LAB — LYME DISEASE SEROLOGY W/REFLEX: Lyme Total Antibody EIA: NEGATIVE

## 2024-02-22 LAB — ANTINUCLEAR ANTIBODIES, IFA: ANA Ab, IFA: NEGATIVE

## 2024-02-22 LAB — PROTIME-INR
INR: 1.1 (ref 0.8–1.2)
Prothrombin Time: 15.3 s — ABNORMAL HIGH (ref 11.4–15.2)

## 2024-02-22 MED ORDER — SODIUM CHLORIDE 0.9 % IV SOLN: INTRAVENOUS | Status: AC

## 2024-02-22 MED ORDER — WARFARIN SODIUM 7.5 MG PO TABS
7.5000 mg | ORAL_TABLET | Freq: Once | ORAL | Status: AC
  Administered 2024-02-22: 7.5 mg via ORAL
  Filled 2024-02-22: qty 1

## 2024-02-22 MED ORDER — WARFARIN - PHARMACIST DOSING INPATIENT: Freq: Every day | Status: DC

## 2024-02-22 MED ORDER — IOHEXOL 350 MG/ML SOLN
75.0000 mL | Freq: Once | INTRAVENOUS | Status: AC | PRN
  Administered 2024-02-22: 75 mL via INTRAVENOUS

## 2024-02-22 NOTE — Progress Notes (Addendum)
 PROGRESS NOTE        PATIENT DETAILS Name: [name] Age: 68 y.o. Sex: female Date of Birth: 1955-12-23 Admit Date: 02/15/2024 Admitting Physician Camila DELENA Ned, MD ERE:Yjbzd, Anthony RAMAN, FNP  Brief Summary: Patient is a 68 y.o.  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  Per H&P-family concerned that she has had possible breakthrough seizures at home.  Subsequently admitted to the hospitalist service.  Significant events: 7/15>> admit to [facility]. 7/17>> A-fib RVR with borderline blood pressure-IV amiodarone  started.  Cardiology consulted.  INR> 4, vitamin K  ordered. 7/18>> LP attempted by neurology-unsuccessful  Significant studies: 7/15>> CT head: No acute intracranial abnormality. 7/15>> CT abdomen/pelvis: No acute intracranial abnormality. 7/15>> EEG: No definite seizures-with evidence of epileptogenicity/cortical dysfunction from right hemisphere. 7/16>> MRI brain: No acute intracranial abnormality.  Significant microbiology data: 7/14>> COVID/influenza/RSV PCR: Negative 7/14>> respiratory virus panel: Negative 7/15>> blood culture: No growth. 7/16>> blood culture: No growth 7/17>> COVID PCR: Negative. 7/19>> CSF culture: No blood 7/19>> CSF biofire panel: Negative  Procedures: 7/19>> lumbar puncture.  Consults: ID Neurology.  Subjective: Complains of RLQ pain today.  Apparently pain has been gradually worsening for the past 1-2 days.  Objective: Vitals: Blood pressure 98/60, pulse 70, temperature 98.1 F (36.7 C), temperature source Oral, resp. rate (!) 23, height 5' 7 (1.702 m), weight 59 kg, SpO2 94%.   Exam: Awake/alert Chest: Clear to auscultation Abdomen: Soft-mild tenderness in the RLQ. Extremities: No edema.  Pertinent Labs/Radiology:    Latest Ref Rng & Units 02/22/2024    4:51 AM 02/21/2024    5:22 AM  02/20/2024    4:43 AM  CBC  WBC 4.0 - 10.5 K/uL 4.3  3.9  4.2   Hemoglobin 12.0 - 15.0 g/dL 89.4  89.9  9.9   Hematocrit 36.0 - 46.0 % 31.8  30.3  30.5   Platelets 150 - 400 K/uL 275  223  188     Lab Results  Component Value Date   NA 138 02/22/2024   K 4.0 02/22/2024   CL 103 02/22/2024   CO2 25 02/22/2024     Assessment/Plan: Sepsis Sepsis physiology has resolved-no fever for 3 days-blood/CSF cultures/viral studies/CT chest/abdomen negative so far. Being monitored off antimicrobial therapy-oral antimicrobial agents discontinued 7/20. Autoimmune workup/tickborne panel pending Has some RLQ tenderness today-will get repeat CT abdomen/pelvis.  Possible breakthrough seizure Seizure threshold probably lowered due to sepsis physiology-and missing several doses of AEDs. EEG results as above-likely reflecting prior ICH Remains on phenytoin /Vimpat  Neurology following.  Acute metabolic encephalopathy Probably due to combination of sepsis physiology/possible breakthrough seizures Encephalopathy has resolved. EEG as above MRI brain negative for any significant abnormalities.  Orthostatic hypotension Occurred on 7/21 when working with physical therapy IV fluid/TED stockings for a few hours Recheck orthostatics tomorrow.  PAF with RVR Occurred on 7/17-required amiodarone  infusion-subsequently quickly flipped back into sinus rhythm Per cardiology-ok to  stop amiodarone  after 24 hours.  No need to transition to oral amiodarone . Maintaining sinus sinus rhythm INR reversed for LP-which was done on 7/19 Will restart Lovenox  but hold off on resuming Coumadin  for now as patient is scheduled for ERCP on 7/24. 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  Mild extrahepatic biliary ductal dilatation seen incidentally on CT abdomen This is a known history of-has  been worked up in the past-MRI abdomen in January 2025-no choledocholithiasis-LFTs are stable-she does not have abdominal pain.  Suspect this is an incidental and chronic finding. Discussed with daughter-patient is apparently scheduled for a ERCP on 7/24.  Addendum D/w GI team-patient was getting a EGD/Colonoscopy and not a ERCP. Recommendations are that we postpone/re-schedule both these elective procedures given her acute illness. Have notified daughter. Will restart Coumadin   History of breast cancer Continue anastrozole   Thrombocytopenia Mild Chronic issue Follow-up CBC periodically  Hypokalemia Replete/recheck  Code status:   Code Status: Full Code   DVT Prophylaxis:SCD's    Family Communication:Daughter [phone] updated 7/21   Disposition Plan: Status is: Inpatient Remains inpatient appropriate because: Severity of illness   Planned Discharge Destination:Home health   Diet: Diet Order             Diet regular Room service appropriate? Yes; Fluid consistency: Thin  Diet effective now                     Antimicrobial agents: Anti-infectives (From admission, onward)    Start     Dose/Rate Route Frequency Ordered Stop   02/19/24 1300  doxycycline  (VIBRA -TABS) tablet 100 mg  Status:  Discontinued        100 mg Oral Every 12 hours 02/19/24 1214 02/21/24 1030   02/18/24 0600  ampicillin  (OMNIPEN) 1 g in sodium chloride  0.9 % 100 mL IVPB  Status:  Discontinued        1 g 300 mL/hr over 20 Minutes Intravenous Every 6 hours 02/18/24 0430 02/18/24 0502   02/18/24 0600  ampicillin  (OMNIPEN) 2 g in sodium chloride  0.9 % 100 mL IVPB  Status:  Discontinued        2 g 300 mL/hr over 20 Minutes Intravenous Every 4 hours 02/18/24 0502 02/21/24 1029   02/18/24 0545  acyclovir  (ZOVIRAX ) 590 mg in dextrose  5 % 100 mL IVPB  Status:  Discontinued        10 mg/kg  59 kg 111.8 mL/hr over 60 Minutes Intravenous Every 8 hours 02/18/24 0458 02/21/24 1029   02/18/24  0030  vancomycin  (VANCOREADY) IVPB 750 mg/150 mL  Status:  Discontinued        750 mg 150 mL/hr over 60 Minutes Intravenous Every 12 hours 02/17/24 1116 02/21/24 1029   02/17/24 1230  vancomycin  (VANCOCIN ) IVPB 1000 mg/200 mL premix        1,000 mg 200 mL/hr over 60 Minutes Intravenous  Once 02/17/24 1116 02/17/24 1312   02/17/24 1200  cefTRIAXone  (ROCEPHIN ) 2 g in sodium chloride  0.9 % 100 mL IVPB  Status:  Discontinued        2 g 200 mL/hr over 30 Minutes Intravenous Every 12 hours 02/17/24 1112 02/21/24 1029   02/17/24 1000  cefTRIAXone  (ROCEPHIN ) 2 g in sodium chloride  0.9 % 100 mL IVPB  Status:  Discontinued        2 g 200 mL/hr over 30 Minutes Intravenous Daily 02/16/24 0548 02/17/24 1112   02/16/24 0545  cefTRIAXone  (ROCEPHIN ) 2 g in sodium chloride  0.9 % 100 mL IVPB        2 g 200 mL/hr over 30 Minutes Intravenous  Once 02/16/24 0540 02/16/24 0630        MEDICATIONS: Scheduled Meds:  anastrozole   1 mg Oral Daily   Chlorhexidine  Gluconate Cloth  6  each Topical Daily   enoxaparin  (LOVENOX ) injection  60 mg Subcutaneous Q12H   folic acid   1 mg Oral Daily   lacosamide   200 mg Oral BID   phenytoin   130 mg Oral BID   potassium chloride   40 mEq Oral BID   sodium chloride  flush  10-40 mL Intracatheter Q12H   sodium chloride  flush  3 mL Intravenous Q12H   Continuous Infusions:   PRN Meds:.acetaminophen  **OR** acetaminophen , albuterol , butalbital -acetaminophen -caffeine , calcium  carbonate, LORazepam , ondansetron  **OR** ondansetron  (ZOFRAN ) IV, sodium chloride  flush, sodium chloride  flush   I have personally reviewed following labs and imaging studies  LABORATORY DATA: CBC: Recent Labs  Lab 02/18/24 0436 02/19/24 0346 02/20/24 0443 02/21/24 0522 02/22/24 0451  WBC 5.6 5.2 4.2 3.9* 4.3  HGB 10.4* 9.9* 9.9* 10.0* 10.5*  HCT 31.2* 29.7* 30.5* 30.3* 31.8*  MCV 101.3* 102.4* 102.7* 102.0* 102.9*  PLT 166 178 188 223 275    Basic Metabolic Panel: Recent Labs  Lab  02/16/24 0630 02/17/24 0401 02/18/24 0436 02/19/24 0346 02/20/24 0443 02/21/24 0522 02/22/24 0451  NA  --    < > 136 139 137 138 138  K  --    < > 3.3* 3.7 3.1* 3.4* 4.0  CL  --    < > 102 106 102 104 103  CO2  --    < > 24 25 26 25 25   GLUCOSE  --    < > 109* 121* 132* 109* 102*  BUN  --    < > 5* <5* <5* <5* 7*  CREATININE  --    < > 0.61 0.82 0.58 0.63 0.70  CALCIUM   --    < > 8.1* 8.1* 8.2* 8.5* 8.9  MG 2.1  --  1.8  --   --   --  1.9   < > = values in this interval not displayed.    GFR: Estimated Creatinine Clearance: 65.3 mL/min (by C-G formula based on SCr of 0.7 mg/dL).  Liver Function Tests: Recent Labs  Lab 02/16/24 0043 02/18/24 0436 02/19/24 0346  AST 41 23 21  ALT 27 20 19   ALKPHOS 68 61 61  BILITOT 1.1 0.7 0.4  PROT 6.1* 5.5* 5.3*  ALBUMIN 2.7* 2.0* 1.9*   No results for input(s): LIPASE, AMYLASE in the last 168 hours. Recent Labs  Lab 02/17/24 0401  AMMONIA 26    Coagulation Profile: Recent Labs  Lab 02/18/24 0436 02/19/24 0346 02/20/24 0443 02/21/24 0522 02/22/24 0451  INR 4.3* 1.4* 1.2 1.3* 1.1    Cardiac Enzymes: No results for input(s): CKTOTAL, CKMB, CKMBINDEX, TROPONINI in the last 168 hours.  BNP (last 3 results) No results for input(s): PROBNP in the last 8760 hours.  Lipid Profile: No results for input(s): CHOL, HDL, LDLCALC, TRIG, CHOLHDL, LDLDIRECT in the last 72 hours.  Thyroid  Function Tests: No results for input(s): TSH, T4TOTAL, FREET4, T3FREE, THYROIDAB in the last 72 hours.   Anemia Panel: No results for input(s): VITAMINB12, FOLATE, FERRITIN, TIBC, IRON, RETICCTPCT in the last 72 hours.  Urine analysis:    Component Value Date/Time   COLORURINE AMBER (A) 02/15/2024 2346   APPEARANCEUR HAZY (A) 02/15/2024 2346   LABSPEC 1.021 02/15/2024 2346   PHURINE 5.0 02/15/2024 2346   GLUCOSEU NEGATIVE 02/15/2024 2346   HGBUR SMALL (A) 02/15/2024 2346   BILIRUBINUR SMALL  (A) 02/15/2024 2346   KETONESUR NEGATIVE 02/15/2024 2346   PROTEINUR 100 (A) 02/15/2024 2346   UROBILINOGEN 0.2 01/17/2015 2350   NITRITE NEGATIVE 02/15/2024  2346   LEUKOCYTESUR NEGATIVE 02/15/2024 2346    Sepsis Labs: Lactic Acid, Venous    Component Value Date/Time   LATICACIDVEN 0.8 02/16/2024 9366    MICROBIOLOGY: Recent Results (from the past 240 hours)  Resp panel by RT-PCR (RSV, Flu A&B, Covid) Anterior Nasal Swab     Status: None   Collection Time: 02/15/24 11:46 PM   Specimen: Anterior Nasal Swab  Result Value Ref Range Status   SARS Coronavirus 2 by RT PCR NEGATIVE NEGATIVE Final   Influenza A by PCR NEGATIVE NEGATIVE Final   Influenza B by PCR NEGATIVE NEGATIVE Final    Comment: (NOTE) The Xpert Xpress SARS-CoV-2/FLU/RSV plus assay is intended as an aid in the diagnosis of influenza from Nasopharyngeal swab specimens and should not be used as a sole basis for treatment. Nasal washings and aspirates are unacceptable for Xpert Xpress SARS-CoV-2/FLU/RSV testing.  Fact Sheet for Patients: BloggerCourse.com  Fact Sheet for Healthcare Providers: SeriousBroker.it  This test is not yet approved or cleared by the [place]  FDA and has been authorized for detection and/or diagnosis of SARS-CoV-2 by FDA under an Emergency Use Authorization (EUA). This EUA will remain in effect (meaning this test can be used) for the duration of the COVID-19 declaration under Section 564(b)(1) of the Act, 21 U.S.C. section 360bbb-3(b)(1), unless the authorization is terminated or revoked.     Resp Syncytial Virus by PCR NEGATIVE NEGATIVE Final    Comment: (NOTE) Fact Sheet for Patients: BloggerCourse.com  Fact Sheet for Healthcare Providers: SeriousBroker.it  This test is not yet approved or cleared by the [place]  FDA and has been authorized for detection and/or diagnosis  of SARS-CoV-2 by FDA under an Emergency Use Authorization (EUA). This EUA will remain in effect (meaning this test can be used) for the duration of the COVID-19 declaration under Section 564(b)(1) of the Act, 21 U.S.C. section 360bbb-3(b)(1), unless the authorization is terminated or revoked.  Performed at [facility], [address] [place], [place] [address]   Respiratory (~20 pathogens) panel by PCR     Status: None   Collection Time: 02/15/24 11:46 PM   Specimen: Nasopharyngeal Swab; Respiratory  Result Value Ref Range Status   Adenovirus NOT DETECTED NOT DETECTED Final   Coronavirus 229E NOT DETECTED NOT DETECTED Final    Comment: (NOTE) The Coronavirus on the Respiratory Panel, DOES NOT test for the novel  Coronavirus (2019 nCoV)    Coronavirus HKU1 NOT DETECTED NOT DETECTED Final   Coronavirus NL63 NOT DETECTED NOT DETECTED Final   Coronavirus OC43 NOT DETECTED NOT DETECTED Final   Metapneumovirus NOT DETECTED NOT DETECTED Final   Rhinovirus / Enterovirus NOT DETECTED NOT DETECTED Final   Influenza A NOT DETECTED NOT DETECTED Final   Influenza B NOT DETECTED NOT DETECTED Final   Parainfluenza Virus 1 NOT DETECTED NOT DETECTED Final   Parainfluenza Virus 2 NOT DETECTED NOT DETECTED Final   Parainfluenza Virus 3 NOT DETECTED NOT DETECTED Final   Parainfluenza Virus 4 NOT DETECTED NOT DETECTED Final   Respiratory Syncytial Virus NOT DETECTED NOT DETECTED Final   Bordetella pertussis NOT DETECTED NOT DETECTED Final   Bordetella Parapertussis NOT DETECTED NOT DETECTED Final   Chlamydophila pneumoniae NOT DETECTED NOT DETECTED Final   Mycoplasma pneumoniae NOT DETECTED NOT DETECTED Final    Comment: Performed at [facility], [address], [place], [place] [address]  MRSA Next Gen by PCR, Nasal     Status: None   Collection Time: 02/16/24  8:40 AM   Specimen: Urine, Clean Catch; Nasal Swab  Result Value Ref Range Status   MRSA by PCR Next Gen NOT DETECTED NOT  DETECTED Final    Comment: (NOTE) The GeneXpert MRSA Assay (FDA approved for NASAL specimens only), is one component of a comprehensive MRSA colonization surveillance program. It is not intended to diagnose MRSA infection nor to guide or monitor treatment for MRSA infections. Test performance is not FDA approved in patients less than 24 years old. Performed at [facility], [address] [place], [place] [address]   Blood culture (routine x 2)     Status: None   Collection Time: 02/16/24  9:33 PM   Specimen: BLOOD  Result Value Ref Range Status   Specimen Description BLOOD SITE NOT SPECIFIED  Final   Special Requests   Final    BOTTLES DRAWN AEROBIC AND ANAEROBIC Blood Culture adequate volume   Culture   Final    NO GROWTH 5 DAYS Performed at [facility], [address] [place], [place] [address]    Report Status 02/21/2024 FINAL  Final  Blood culture (routine x 2)     Status: None   Collection Time: 02/16/24  9:34 PM   Specimen: BLOOD  Result Value Ref Range Status   Specimen Description BLOOD SITE NOT SPECIFIED  Final   Special Requests   Final    BOTTLES DRAWN AEROBIC AND ANAEROBIC Blood Culture adequate volume   Culture   Final    NO GROWTH 5 DAYS Performed at [facility], [address], [place], [place] [address]    Report Status 02/21/2024 FINAL  Final  Culture, blood (Routine X 2) w Reflex to ID Panel     Status: None   Collection Time: 02/17/24 12:10 PM   Specimen: BLOOD RIGHT ARM  Result Value Ref Range Status   Specimen Description BLOOD RIGHT ARM  Final   Special Requests   Final    BOTTLES DRAWN AEROBIC ONLY Blood Culture adequate volume   Culture   Final    NO GROWTH 5 DAYS Performed at [facility], [address] [place], [place] [address]    Report Status 02/22/2024 FINAL  Final  Culture, blood (Routine X 2) w Reflex to ID Panel     Status: None   Collection Time: 02/17/24 12:11 PM   Specimen: BLOOD LEFT HAND  Result  Value Ref Range Status   Specimen Description BLOOD LEFT HAND  Final   Special Requests   Final    BOTTLES DRAWN AEROBIC ONLY Blood Culture results may not be optimal due to an inadequate volume of blood received in culture bottles   Culture   Final    NO GROWTH 5 DAYS Performed at [facility], [address], [place], [place] [address]    Report Status 02/22/2024 FINAL  Final  SARS Coronavirus 2 by RT PCR (hospital order, performed in [facility] hospital lab) *cepheid single result test* Anterior Nasal Swab     Status: None   Collection Time: 02/18/24  2:10 PM   Specimen: Anterior Nasal Swab  Result Value Ref Range Status   SARS Coronavirus 2 by RT PCR NEGATIVE NEGATIVE Final    Comment: Performed at [facility], [address] [place], [place] [address]  CSF culture w Gram Stain     Status: None (Preliminary result)   Collection Time: 02/19/24  2:07 PM   Specimen: CSF; Cerebrospinal  Fluid  Result Value Ref Range Status   Specimen Description CSF  Final   Special Requests LUMBAR PUNCTURE  Final   Gram Stain NO WBC SEEN NO ORGANISMS SEEN CYTOSPIN SMEAR   Final   Culture   Final    NO GROWTH 2 DAYS Performed at [facility], [address], [place], [place] [address]    Report Status PENDING  Incomplete  Culture, fungus without smear     Status: None (Preliminary result)   Collection Time: 02/19/24  2:07 PM   Specimen: CSF; Cerebrospinal Fluid  Result Value Ref Range Status   Specimen Description CSF  Final   Special Requests LUMBAR PUNCTURE  Final   Culture   Final    NO FUNGUS ISOLATED AFTER 1 DAY Performed at [facility], [address], [place], [place] [address]    Report Status PENDING  Incomplete    RADIOLOGY STUDIES/RESULTS: DG FL GUIDED LUMBAR PUNCTURE Result Date: 02/20/2024 CLINICAL DATA:  68 year old female with a history of seizures who presented to the ED with fever, confusion, and headaches. Request for lumbar puncture. EXAM:  LUMBAR PUNCTURE UNDER FLUOROSCOPY PROCEDURE: An appropriate skin entry site was determined fluoroscopically. Operator donned sterile gloves and mask. Skin site was marked, then prepped with Betadine, draped in usual sterile fashion, and infiltrated locally with 1% lidocaine . A 20 gauge spinal needle advanced into the thecal sac at L5-S1 from a left interlaminar approach. Clear colorless CSF spontaneously returned, with opening pressure of 18 cm water. 15 ml CSF were collected and divided among 4 sterile vials for the requested laboratory studies. The needle was then removed. The patient tolerated the procedure well and there were no complications. FLUOROSCOPY: Radiation Exposure Index (as provided by the fluoroscopic device): 26.5 mGy Kerma IMPRESSION: Technically successful lumbar puncture under fluoroscopy. This exam was performed by Sherrilee Bal, PA-C, and was supervised and interpreted by Dr. DELENA Balder. Electronically Signed   By: Juliene Balder M.D.   On: 02/20/2024 14:45      LOS: 6 days   Donalda Applebaum, MD  [facility]    To contact the attending provider between 7A-7P or the covering provider during after hours 7P-7A, please log into the web site [email or web address] and access using universal [place] password for that web site. If you do not have the password, please call the hospital operator.  02/22/2024, 10:58 AM

## 2024-02-22 NOTE — Care Management Important Message (Signed)
 Important Message  Patient Details  Name: [name] [name] MRN: [id number] Date of Birth: 03/31/1956   Important Message Given:  Yes - Medicare IM     Claretta Deed 02/22/2024, 4:39 PM

## 2024-02-22 NOTE — Evaluation (Signed)
 Physical Therapy Evaluation Patient Details Name: [name] MRN: [id number] DOB: 1955/12/01 Today's Date: 02/22/2024  History of Present Illness  Patient is a 68 y.o.  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  Per H&P-family concerned that she has had possible breakthrough seizures at home.  Clinical Impression   Pt admitted with above diagnosis. Lives at home with family, in a 2-level home with a stair lifft; Prior to admission, pt was able to manage independently, walk without an assistive device, no need for assist with ADLs; Presents to PT with generalized weakness, tremulous movement, noteworthy BP drop in standing;  Min assis tto get up to EOB, light mod assist to power up to stand, and min steadying assist for pivot steps bed to recliner; became lightheaded upon standing -- unable to stand long enough for a 3 minute standing BP; She is very motivated, and values getting up and moving; Anticipate good progress; Pt currently with functional limitations due to the deficits listed below (see PT Problem List). Pt will benefit from skilled PT to increase their independence and safety with mobility to allow discharge to the venue listed below.           If plan is discharge home, recommend the following: A little help with walking and/or transfers;A little help with bathing/dressing/bathroom;Assist for transportation;Assistance with cooking/housework   Can travel by private vehicle        Equipment Recommendations Other (comment) (May consider rollator; already has RW)  Recommendations for Other Services  OT consult (as ordered)    Functional Status Assessment Patient has had a recent decline in their functional status and demonstrates the ability to make significant improvements in function in a reasonable and predictable amount of time.     Precautions / Restrictions  Precautions Precautions: Fall;Other (comment) (Droplet; seizures) Restrictions Weight Bearing Restrictions Per Provider Order: No      Mobility  Bed Mobility Overal bed mobility: Needs Assistance Bed Mobility: Supine to Sit     Supine to sit: Min assist     General bed mobility comments: Min assist to steady coming ot sit; opted for log roll-type technique to get up due to back soreness    Transfers Overall transfer level: Needs assistance Equipment used: 1 person hand held assist Transfers: Sit to/from Stand, Bed to chair/wheelchair/BSC Sit to Stand: Mod assist   Step pivot transfers: Min assist       General transfer comment: Light mod assist to power up; Handheld assist to steady while taking steps to recliner; tended to hold to bedrails for steadiness    Ambulation/Gait               General Gait Details: Deferred due to lightheadedness with standing  Stairs            Wheelchair Mobility     Tilt Bed    Modified Rankin (Stroke Patients Only)       Balance Overall balance assessment: Needs assistance   Sitting balance-Leahy Scale: Good       Standing balance-Leahy Scale: Poor (approaching fair)                               Pertinent Vitals/Pain Pain Assessment Pain Assessment: No/denies pain    Home Living Family/patient expects to be discharged to:: Private residence Living Arrangements: Children Available Help at Discharge: Available 24 hours/day  Type of Home: House Home Access: Stairs to enter;Level entry     Alternate Level Stairs-Number of Steps: flight, but states stair lift. Home Layout: Two level Home Equipment: Agricultural consultant (2 wheels);Shower seat Additional Comments: Pt has a stair lift and a shower seat    Prior Function Prior Level of Function : Independent/Modified Independent             Mobility Comments: Independent  without an AD per her report ADLs Comments: Pt reports Independent with  ADLs. Per chart review, pt receives assistance with transportation and meal prep.     Extremity/Trunk Assessment   Upper Extremity Assessment Upper Extremity Assessment: Defer to OT evaluation (tremulous)    Lower Extremity Assessment Lower Extremity Assessment: Generalized weakness    Cervical / Trunk Assessment Cervical / Trunk Assessment: Other exceptions Cervical / Trunk Exceptions: Reports quite sore low back from being in the bed  Communication   Communication Communication: No apparent difficulties    Cognition Arousal: Alert Behavior During Therapy: WFL for tasks assessed/performed   PT - Cognitive impairments: No apparent impairments                         Following commands: Intact       Cueing Cueing Techniques: Verbal cues     General Comments General comments (skin integrity, edema, etc.): Lightheaded while standing; vitals as follows   02/22/24 0945 02/22/24 0948  Vital Signs  Patient Position (if appropriate) Orthostatic Vitals  --   Orthostatic Lying   BP- Lying 91/62  --   Pulse- Lying 85  --   Orthostatic Sitting  BP- Sitting 100/73 116/70 (in recliner, feet up, post initial standing attempt)  Pulse- Sitting 86 96  Orthostatic Standing at 0 minutes  BP- Standing at 0 minutes (!) 84/73 (rEports lightheaded)  --   Pulse- Standing at 0 minutes 97  --   Orthostatic Standing at 3 minutes  BP- Standing at 3 minutes  (Unable to stand long enough for 3 minute standing)  --        Exercises     Assessment/Plan    PT Assessment Patient needs continued PT services  PT Problem List Decreased strength;Decreased activity tolerance;Decreased balance;Decreased mobility;Decreased coordination;Decreased cognition;Decreased knowledge of use of DME;Decreased safety awareness;Decreased knowledge of precautions;Cardiopulmonary status limiting activity       PT Treatment Interventions DME instruction;Gait training;Stair training;Functional  mobility training;Therapeutic activities;Therapeutic exercise;Balance training;Neuromuscular re-education;Cognitive remediation;Patient/family education    PT Goals (Current goals can be found in the Care Plan section)  Acute Rehab PT Goals Patient Stated Goal: Be abel to walk with confidence PT Goal Formulation: With patient Time For Goal Achievement: 03/07/24 Potential to Achieve Goals: Good    Frequency Min 3X/week     Co-evaluation               AM-PAC PT 6 Clicks Mobility  Outcome Measure Help needed turning from your back to your side while in a flat bed without using bedrails?: A Little Help needed moving from lying on your back to sitting on the side of a flat bed without using bedrails?: A Little Help needed moving to and from a bed to a chair (including a wheelchair)?: A Little Help needed standing up from a chair using your arms (e.g., wheelchair or bedside chair)?: A Lot Help needed to walk in hospital room?: A Lot Help needed climbing 3-5 steps with a railing? : A Lot 6 Click Score: 15  End of Session Equipment Utilized During Treatment: Gait belt Activity Tolerance: Patient tolerated treatment well;Other (comment) (Happy to be up and OOB; noteworthy lightheadedness in standing) Patient left: in chair;with call bell/phone within reach;with chair alarm set Nurse Communication: Mobility status PT Visit Diagnosis: Unsteadiness on feet (R26.81);Muscle weakness (generalized) (M62.81);Other abnormalities of gait and mobility (R26.89)    Time: 0932-1000 PT Time Calculation (min) (ACUTE ONLY): 28 min   Charges:   PT Evaluation $PT Eval Moderate Complexity: 1 Mod PT Treatments $Therapeutic Activity: 8-22 mins PT General Charges $$ ACUTE PT VISIT: 1 Visit         Silvano Currier, PT  Acute Rehabilitation Services Office [phone] Secure Chat welcomed   Silvano VEAR Currier 02/22/2024, 10:28 AM

## 2024-02-22 NOTE — Progress Notes (Signed)
 Pt has been on coumadin  for a hx of DVT/afib. Coumadin  has been on hold for upcoming ERCP., which has now been rescheduled.  Continues on Lovenox .  Lovenox  60mg  SQ BID Restarting Warfarin at 7.5 mg po x 1  Thank you. Olam Monte, PharmD 02/22/2024 1:44 PM

## 2024-02-22 NOTE — Telephone Encounter (Signed)
 Pt should be rescheduled Hospitalized and being treated for sepsis and metabolic encephalopathy Chart reviewed raises questions of uncontrolled seizures

## 2024-02-22 NOTE — Evaluation (Signed)
 Occupational Therapy Evaluation Patient Details Name: [name] MRN: [id number] DOB: Jul 05, 1956 Today's Date: 02/22/2024   History of Present Illness   Patient is a 68 y.o.  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  Per H&P-family concerned that she has had possible breakthrough seizures at home.     Clinical Impressions Pt reports independence in ADLs, IADLs and mobility at baseline. She lives with her daughter's family in a 2 story house with a chair lift. Pt endorses infrequent seizures and falls. Presents with generalized weakness and impaired standing balance requiring min assist to stand and pivot and set up to min assist for ADLs. Pt with orthostatic hypotension with PT earlier today, awaiting fluids and compression hose before ambulating. Recommending home with HHOT.   If plan is discharge home, recommend the following:   A little help with walking and/or transfers;A little help with bathing/dressing/bathroom;Assistance with cooking/housework;Assist for transportation;Help with stairs or ramp for entrance     Functional Status Assessment   Patient has had a recent decline in their functional status and demonstrates the ability to make significant improvements in function in a reasonable and predictable amount of time.     Equipment Recommendations   None recommended by OT     Recommendations for Other Services         Precautions/Restrictions   Precautions Precautions: Fall Precaution/Restrictions Comments: watch BP Restrictions Weight Bearing Restrictions Per Provider Order: No     Mobility Bed Mobility               General bed mobility comments: received in chair    Transfers Overall transfer level: Needs assistance Equipment used: 1 person hand held assist Transfers: Sit to/from Stand, Bed to chair/wheelchair/BSC Sit to  Stand: Min assist     Step pivot transfers: Min assist     General transfer comment: assist to rise and steady      Balance Overall balance assessment: Needs assistance   Sitting balance-Leahy Scale: Good     Standing balance support: Single extremity supported Standing balance-Leahy Scale: Poor                             ADL either performed or assessed with clinical judgement   ADL Overall ADL's : Needs assistance/impaired Eating/Feeding: Independent;Sitting   Grooming: Set up;Sitting;Wash/dry hands;Wash/dry face   Upper Body Bathing: Set up;Sitting   Lower Body Bathing: Minimal assistance;Sit to/from stand   Upper Body Dressing : Set up;Sitting   Lower Body Dressing: Minimal assistance;Sit to/from stand   Toilet Transfer: Minimal assistance;Stand-pivot;BSC/3in1   Toileting- Clothing Manipulation and Hygiene: Set up;Sitting/lateral lean               Vision Baseline Vision/History: 1 Wears glasses Ability to See in Adequate Light: 0 Adequate Patient Visual Report: No change from baseline       Perception         Praxis         Pertinent Vitals/Pain Pain Assessment Pain Assessment: No/denies pain     Extremity/Trunk Assessment Upper Extremity Assessment Upper Extremity Assessment: Overall WFL for tasks assessed   Lower Extremity Assessment Lower Extremity Assessment: Defer to PT evaluation   Cervical / Trunk Assessment Cervical / Trunk Assessment: Normal Cervical / Trunk Exceptions: Reports quite sore low back from being in the bed   Communication Communication Communication: No apparent difficulties  Cognition Arousal: Alert Behavior During Therapy: WFL for tasks assessed/performed Cognition: No apparent impairments             OT - Cognition Comments: Pt with hx of seizures and falls, states she wants to live alone, but has been staying with her daughter x 5 years.                 Following commands:  Intact       Cueing  General Comments   Cueing Techniques: Verbal cues  Lightheaded while standing; vitals as follows   Exercises     Shoulder Instructions      Home Living Family/patient expects to be discharged to:: Private residence Living Arrangements: Children Available Help at Discharge: Available 24 hours/day Type of Home: House Home Access: Level entry     Home Layout: Two level Alternate Level Stairs-Number of Steps: flight, but states stair lift. Alternate Level Stairs-Rails: Right Bathroom Shower/Tub: Producer, television/film/video: Standard     Home Equipment: Agricultural consultant (2 wheels);Shower seat;Grab bars - tub/shower   Additional Comments: Pt has a stair lift and a shower seat      Prior Functioning/Environment Prior Level of Function : Independent/Modified Independent             Mobility Comments: Independent  without an AD per her report ADLs Comments: Pt reports Independent with ADLs, cooks, cleans, manages her own meds.    OT Problem List: Decreased strength;Decreased activity tolerance;Impaired balance (sitting and/or standing)   OT Treatment/Interventions: Self-care/ADL training;DME and/or AE instruction;Therapeutic activities;Patient/family education;Balance training      OT Goals(Current goals can be found in the care plan section)   Acute Rehab OT Goals OT Goal Formulation: With patient Time For Goal Achievement: 03/07/24 Potential to Achieve Goals: Good ADL Goals Pt Will Perform Grooming: with supervision;standing Pt Will Perform Lower Body Bathing: with supervision;sit to/from stand Pt Will Perform Lower Body Dressing: with supervision;sit to/from stand Pt Will Transfer to Toilet: with supervision;ambulating;regular height toilet Pt Will Perform Toileting - Clothing Manipulation and hygiene: with supervision;sit to/from stand   OT Frequency:  Min 2X/week    Co-evaluation              AM-PAC OT 6 Clicks Daily  Activity     Outcome Measure Help from another person eating meals?: None Help from another person taking care of personal grooming?: A Little Help from another person toileting, which includes using toliet, bedpan, or urinal?: A Little Help from another person bathing (including washing, rinsing, drying)?: A Little Help from another person to put on and taking off regular upper body clothing?: A Little Help from another person to put on and taking off regular lower body clothing?: A Little 6 Click Score: 19   End of Session Equipment Utilized During Treatment: Gait belt  Activity Tolerance: Treatment limited secondary to medical complications (Comment) (orthostatic, awaitng bolus and ted hose) Patient left: in chair;with call bell/phone within reach;with chair alarm set  OT Visit Diagnosis: Unsteadiness on feet (R26.81);Muscle weakness (generalized) (M62.81)                Time: 8897-8864 OT Time Calculation (min): 33 min Charges:  OT General Charges $OT Visit: 1 Visit OT Evaluation $OT Eval Moderate Complexity: 1 Mod OT Treatments $Self Care/Home Management : 8-22 mins  Mliss HERO, OTR/L Acute Rehabilitation Services Office: [phone]   Kennth Mliss Helling 02/22/2024, 11:55 AM

## 2024-02-22 NOTE — TOC Progression Note (Signed)
 Transition of Care [facility]) - Progression Note    Patient Details  Name: PRESLYN WARR MRN: [id number] Date of Birth: [birth year]  Transition of Care [facility]) CM/SW Contact  Robynn Eileen Hoose, RN Phone Number: 02/22/2024, 3:31 PM  Clinical Narrative:   Progression rounds discussion; possible discharge tomorrow. HH PT/OT recommendations noted, message sent to Amy with [facility].     Expected Discharge Plan: Home w Home Health Services Barriers to Discharge: Continued Medical Work up  Expected Discharge Plan and Services   Discharge Planning Services: CM Consult   Living arrangements for the past 2 months: Single Family Home                                       Social Determinants of Health (SDOH) Interventions SDOH Screenings   Food Insecurity: No Food Insecurity (02/16/2024)  Housing: Low Risk  (02/16/2024)  Transportation Needs: No Transportation Needs (02/16/2024)  Utilities: Not At Risk (02/16/2024)  Alcohol Screen: Low Risk  (03/11/2023)  Depression (PHQ2-9): Low Risk  (03/11/2023)  Financial Resource Strain: Low Risk  (12/31/2022)  Social Connections: Moderately Integrated (02/16/2024)  Tobacco Use: Medium Risk (12/31/2023)    Readmission Risk Interventions     No data to display

## 2024-02-23 DIAGNOSIS — I480 Paroxysmal atrial fibrillation: Secondary | ICD-10-CM | POA: Diagnosis not present

## 2024-02-23 DIAGNOSIS — G9340 Encephalopathy, unspecified: Secondary | ICD-10-CM | POA: Diagnosis not present

## 2024-02-23 DIAGNOSIS — G9341 Metabolic encephalopathy: Secondary | ICD-10-CM | POA: Diagnosis not present

## 2024-02-23 DIAGNOSIS — I959 Hypotension, unspecified: Secondary | ICD-10-CM | POA: Diagnosis not present

## 2024-02-23 LAB — EHRLICHIA ANTIBODY PANEL
E chaffeensis (HGE) Ab, IgG: NEGATIVE
E chaffeensis (HGE) Ab, IgM: NEGATIVE
E. Chaffeensis (HME) IgM Titer: NEGATIVE
E.Chaffeensis (HME) IgG: NEGATIVE

## 2024-02-23 LAB — CSF CULTURE W GRAM STAIN
Culture: NO GROWTH
Gram Stain: NONE SEEN

## 2024-02-23 LAB — RHEUMATOID FACTOR: Rheumatoid fact SerPl-aCnc: 13.3 [IU]/mL (ref <14.0)

## 2024-02-23 LAB — PROTIME-INR
INR: 1.1 (ref 0.8–1.2)
Prothrombin Time: 15.1 s (ref 11.4–15.2)

## 2024-02-23 LAB — VDRL, CSF: VDRL Quant, CSF: NONREACTIVE

## 2024-02-23 MED ORDER — ENOXAPARIN (LOVENOX) PATIENT EDUCATION KIT
PACK | Freq: Once | Status: DC
  Filled 2024-02-23: qty 1

## 2024-02-23 MED ORDER — CARBAMIDE PEROXIDE 6.5 % OT SOLN
5.0000 [drp] | Freq: Two times a day (BID) | OTIC | Status: DC
  Administered 2024-02-23: 5 [drp] via OTIC
  Filled 2024-02-23: qty 15

## 2024-02-23 NOTE — Plan of Care (Signed)

## 2024-02-23 NOTE — Progress Notes (Signed)
 Physical Therapy Treatment Patient Details Name: [name] MRN: [id number] DOB: [birth year] Today's Date: 02/23/2024   History of Present Illness Patient is a 68 y.o.  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  Per H&P-family concerned that she has had possible breakthrough seizures at home.    PT Comments  Pt received sitting in the recliner and agreeable to session. Orthostatic BP taken and stable,listed below.  Pt able to tolerate gait trial with reports of slight lightheadedness. Pt limited by impaired activity tolerance and weakness. Pt reports tremors have been present for ~30 years since brain surgery. Pt reports furniture walking at home, so education provided on use of RW for safety and reduced fall risk upon return home with pt verbalizing understanding. Pt continues to benefit from PT services to progress toward functional mobility goals.  Orthostatic BPs  Sitting 104/71  Standing 105/66  Sitting after gait trial 115/80      If plan is discharge home, recommend the following: A little help with walking and/or transfers;A little help with bathing/dressing/bathroom;Assist for transportation;Assistance with cooking/housework   Can travel by private vehicle        Equipment Recommendations  Other (comment) (May consider rollator; already has RW)    Recommendations for Other Services       Precautions / Restrictions Precautions Precautions: Fall Precaution/Restrictions Comments: watch BP Restrictions Weight Bearing Restrictions Per Provider Order: No     Mobility  Bed Mobility               General bed mobility comments: Pt in recliner at beginning and end of session    Transfers Overall transfer level: Needs assistance Equipment used: None, Rolling walker (2 wheels) Transfers: Sit to/from Stand Sit to Stand: Contact guard assist, Min assist            General transfer comment: CGA during power up and light min A for steadying upon standing    Ambulation/Gait Ambulation/Gait assistance: Contact guard assist Gait Distance (Feet): 75 Feet Assistive device: Rolling walker (2 wheels) Gait Pattern/deviations: Step-through pattern, Trunk flexed, Decreased stride length       General Gait Details: Pt demonstrates slow gait with reliance on BUE support on RW for stability. Cues for upright posture and RW proximity, especially during turns   Optometrist     Tilt Bed    Modified Rankin (Stroke Patients Only)       Balance Overall balance assessment: Needs assistance   Sitting balance-Leahy Scale: Good     Standing balance support: Bilateral upper extremity supported, During functional activity, Reliant on assistive device for balance Standing balance-Leahy Scale: Poor Standing balance comment: with RW support                            Communication Communication Communication: No apparent difficulties  Cognition Arousal: Alert Behavior During Therapy: WFL for tasks assessed/performed   PT - Cognitive impairments: No apparent impairments                         Following commands: Intact      Cueing Cueing Techniques: Verbal cues  Exercises      General Comments General comments (skin integrity, edema, etc.): Slight lightheadedness during gait, but no LOB and BP stable.  Pertinent Vitals/Pain Pain Assessment Pain Assessment: No/denies pain     PT Goals (current goals can now be found in the care plan section) Acute Rehab PT Goals Patient Stated Goal: Be abel to walk with confidence PT Goal Formulation: With patient Time For Goal Achievement: 03/07/24 Progress towards PT goals: Progressing toward goals    Frequency    Min 3X/week       AM-PAC PT 6 Clicks Mobility   Outcome Measure  Help needed turning from your back to  your side while in a flat bed without using bedrails?: A Little Help needed moving from lying on your back to sitting on the side of a flat bed without using bedrails?: A Little Help needed moving to and from a bed to a chair (including a wheelchair)?: A Little Help needed standing up from a chair using your arms (e.g., wheelchair or bedside chair)?: A Little Help needed to walk in hospital room?: A Little Help needed climbing 3-5 steps with a railing? : A Lot 6 Click Score: 17    End of Session Equipment Utilized During Treatment: Gait belt Activity Tolerance: Patient tolerated treatment well;Patient limited by fatigue Patient left: in chair;with call bell/phone within reach;with chair alarm set Nurse Communication: Mobility status PT Visit Diagnosis: Unsteadiness on feet (R26.81);Muscle weakness (generalized) (M62.81);Other abnormalities of gait and mobility (R26.89)     Time: 9067-9042 PT Time Calculation (min) (ACUTE ONLY): 25 min  Charges:    $Gait Training: 8-22 mins $Therapeutic Activity: 8-22 mins PT General Charges $$ ACUTE PT VISIT: 1 Visit                     Darryle George, PTA Acute Rehabilitation Services Secure Chat Preferred  [phone]    Darryle George 02/23/2024, 12:00 PM

## 2024-02-23 NOTE — Discharge Summary (Signed)
 PATIENT DETAILS Name: [name] Age: 68 y.o. Sex: female Date of Birth: February 18, 1956 MRN: [id number]. Admitting Physician: Camila DELENA Ned, MD ERE:Yjbzd, Anthony RAMAN, FNP  Admit Date: 02/15/2024 Discharge date: 02/23/2024  Recommendations for Outpatient Follow-up:  Follow up with PCP in 1-2 weeks Please obtain CMP/CBC in one week CSF paraneoplastic panel/CSF VDRL/CSF oligoclonal bands-pending-please follow Ehrlichia antibody panel-pending-please follow Please ensure follow-up with primary neurologist-Dr. Georjean  Admitted From:  Home  Disposition: Home health   Discharge Condition: good  CODE STATUS:   Code Status: Full Code   Diet recommendation:  Diet Order             Diet - low sodium heart healthy           Diet regular Room service appropriate? Yes; Fluid consistency: Thin  Diet effective now                    Brief Summary: Patient is a 68 y.o.  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  Per H&P-family concerned that she has had possible breakthrough seizures at home.  Subsequently admitted to the hospitalist service.   Significant events: 7/15>> admit to [facility]. 7/17>> A-fib RVR with borderline blood pressure-IV amiodarone  started.  Cardiology consulted.  INR> 4, vitamin K  ordered. 7/18>> LP attempted by neurology-unsuccessful   Significant studies: 7/15>> CT head: No acute intracranial abnormality. 7/15>> CT abdomen/pelvis: No acute intracranial abnormality. 7/15>> EEG: No definite seizures-with evidence of epileptogenicity/cortical dysfunction from right hemisphere. 7/16>> MRI brain: No acute intracranial abnormality. 7/21>> CT abdomen/pelvis: No acute abnormalities.   Significant microbiology data: 7/14>> COVID/influenza/RSV PCR: Negative 7/14>> respiratory virus panel: Negative 7/15>> blood culture: No growth. 7/16>> blood  culture: No growth 7/17>> COVID PCR: Negative. 7/19>> CSF culture: No blood 7/19>> CSF biofire panel: Negative   Procedures: 7/19>> lumbar puncture.   Consults: ID Neurology.  Brief Hospital Course: Sepsis Sepsis physiology has resolved-no fever for 4 days-blood/CSF cultures/viral studies/CT chest/abdomen negative so far. Being monitored off antimicrobial therapy-oral antimicrobial agents discontinued 7/20. ANA/RA factor/anti-CCP negative, Lyme's disease/RMFS serology negative. Some CSF studies still pending-see above-have asked patient daughter to ensure that outpatient PCP follows this test results. Remains stable for discharge today.   Possible breakthrough seizure Seizure threshold probably lowered due to sepsis physiology-and missing several doses of AEDs (out of Vimpat  for a week-and just restarted several days prior to this hospitalization). EEG results as above-likely reflecting prior ICH Remains on phenytoin /Vimpat  Neurology followed closely. Ensure outpatient follow-up with neurology   Acute metabolic encephalopathy Probably due to combination of sepsis physiology/possible breakthrough seizures Encephalopathy has resolved. EEG as above MRI brain negative for any significant abnormalities.   Orthostatic hypotension Occurred on 7/21 when working with physical therapy Treated with IV fluid/TED stockings-orthostatic vital signs negative this morning.  Patient ambulated from bed to chair without any major issues.   PAF with RVR Occurred on 7/17-required amiodarone  infusion-subsequently quickly flipped back into sinus rhythm Per cardiology-ok to  stop amiodarone  after 24 hours.  No need to transition to oral amiodarone . Maintaining sinus sinus rhythm INR reversed for LP-which was done on 7/19 On overlapping Lovenox /Coumadin -patient will need INR checked at PCPs office by the end of the week.  Per daughter who I spoke to on 7/22-patient follows with PCP for INR checks-she  is aware of the need to get a INR checked by the end of the week.    Note- Patient  was apparently scheduled for EGD/colonoscopy as an outpatient on 7/24-reached out to GI team on 7/21-this has been postponed till patient has completely recovered from this acute illness.  In anticipation of colonoscopy/EGD-she was given Lovenox  injections for 10 days which the daughter has at home.  As noted above-plan is to continue Coumadin -until INR is therapeutic.  I have asked the nursing staff to provide Lovenox  administration teaching prior to discharge.  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   Mild extrahepatic biliary ductal dilatation seen incidentally on CT abdomen This is a known history of-has been worked up in the past-MRI abdomen in January 2025-no choledocholithiasis-LFTs are stable-she does not have abdominal pain.  Suspect this is an incidental and chronic finding. Patient follows with GI-see above-regarding EGD/colonoscopy-GI office will get in touch with patient to reschedule her appointment.    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   Supratherapeutic INR   History of intracranial hemorrhage   Seizure disorder (HCC)   History of breast cancer   Thrombocytopenia- chronic   Encephalitis   Discharge Instructions:  Activity:  As tolerated with Full fall precautions use walker/cane & assistance as needed  Discharge Instructions     Call MD for:  difficulty breathing, headache or visual disturbances   Complete by: As directed    Call MD for:  extreme fatigue    Complete by: As directed    Call MD for:  persistant dizziness or light-headedness   Complete by: As directed    Diet - low sodium heart healthy   Complete by: As directed    Discharge instructions   Complete by: As directed    Follow with Primary MD  Dyane Anthony RAMAN, FNP in 1-2 weeks  Numerous CSF studies and some blood tests are still pending-please ask your primary care practitioner to review discharge summary and follow-up on those test results.  Please get a complete blood count and chemistry panel checked by your Primary MD at your next visit, and again as instructed by your Primary MD.  Get Medicines reviewed and adjusted: Please take all your medications with you for your next visit with your Primary MD  Laboratory/radiological data: Please request your Primary MD to go over all hospital tests and procedure/radiological results at the follow up, please ask your Primary MD to get all Hospital records sent to his/her office.  In some cases, they will be blood work, cultures and biopsy results pending at the time of your discharge. Please request that your primary care M.D. follows up on these results.  Also Note the following: If you experience worsening of your admission symptoms, develop shortness of breath, life threatening emergency, suicidal or homicidal thoughts you must seek medical attention immediately by calling 911 or calling your MD immediately  if symptoms less severe.  You must read complete instructions/literature along with all the possible adverse reactions/side effects for all the Medicines you take and that have been prescribed to you. Take any new Medicines after you have completely understood and accpet all the possible adverse reactions/side effects.   Do not drive when taking Pain medications or sleeping medications (Benzodaizepines)  Do not take more than prescribed Pain, Sleep and Anxiety Medications. It is not advisable to combine anxiety,sleep and pain  medications without talking with your primary care practitioner  Special Instructions: If you have smoked or chewed Tobacco  in the last 2 yrs please stop smoking, stop any regular Alcohol  and or any Recreational drug use.  Wear Seat belts while driving.  Please note: You were cared for by a hospitalist during your hospital stay. Once you are discharged, your primary care physician will handle any further medical issues. Please note that NO REFILLS for any discharge medications will be authorized once you are discharged, as it is imperative that you return to your primary care physician (or establish a relationship with a primary care physician if you do not have one) for your post hospital discharge needs so that they can reassess your need for medications and monitor your lab values.   Increase activity slowly   Complete by: As directed       Allergies as of 02/23/2024       Reactions   Zonegran [zonisamide] Other (See Comments)   Numbness and tingling over whole body   Chocolate Other (See Comments)   Triggers seizures   Codeine Nausea And Vomiting   Keppra  [levetiracetam ] Other (See Comments)   Causes seizures   Lyrica  [pregabalin ] Nausea And Vomiting   Olive Oil Rash        Medication List     TAKE these medications    acetaminophen  325 MG tablet Commonly known as: TYLENOL  Take 2 tablets (650 mg total) by mouth every 4 (four) hours as needed for mild pain (pain score 1-3). What changed:  when to take this reasons to take this   anastrozole  1 MG tablet Commonly known as: ARIMIDEX  Take 1 tablet (1 mg total) by mouth daily.   Dilantin  100 MG ER capsule Generic drug: phenytoin  Take 1 capsule twice a day (take with 30mg  capsule for total of 130mg  twice a day) What changed:  how much to take how to take this when to take this additional instructions   Dilantin  30 MG ER capsule Generic drug: phenytoin  Take 1 capsule twice a day (Take with 100mg  capsule for total  of 130mg  twice a day) What changed:  how much to take how to take this when to take this additional instructions   enoxaparin  60 MG/0.6ML injection Commonly known as: LOVENOX  inject 60mg  Injection twice a day; Duration: 10 days   folic acid  1 MG tablet Commonly known as: FOLVITE  Take 1 tablet (1 mg total) by mouth daily.   LORazepam  1 MG tablet Commonly known as: ATIVAN  TAKE 1 TABLET BY MOUTH AS DIRECTED AS NEEDED FOR 3 OR MORE SEIZURES IN 24 HOURS. DO NOT TAKE MORE THAN 2 TABLETS IN 24 HOURS. Strength: 1 mg What changed:  how much to take how to take this when to take this reasons to take this additional instructions   Na Sulfate-K Sulfate-Mg Sulfate concentrate 17.5-3.13-1.6 GM/177ML Soln Commonly known as: SUPREP Take as directed   Vimpat  200 MG Tabs tablet Generic drug: lacosamide  Take 1 tablet (200 mg total) by mouth 2 (two) times daily.   warfarin 7.5 MG tablet Commonly known as: COUMADIN  Take 7.5 mg by mouth every evening.        Follow-up Information     Dyane Anthony RAMAN, FNP. Schedule an appointment as soon as possible for a visit on 02/25/2024.   Specialty: Family Medicine Why: For a INR check and  posthospital discharge visit. Contact information: [address] [place] [address] [phone]         Georjean Darice HERO, MD. Schedule an appointment as soon as possible for a visit in 1 week(s).   Specialty: Neurology Contact information: [address] [place] [address] [phone]                Allergies  Allergen Reactions   Zonegran [Zonisamide] Other (See Comments)    Numbness and tingling over whole body   Chocolate Other (See Comments)    Triggers seizures   Codeine Nausea And Vomiting   Keppra  [Levetiracetam ] Other (See Comments)    Causes seizures   Lyrica  [Pregabalin ] Nausea And Vomiting   Olive Oil Rash     Other Procedures/Studies: CT ABDOMEN PELVIS W CONTRAST Result Date:  02/22/2024 CLINICAL DATA:  Abdominal pain, acute nonlocalized RIGHT lower quadrant pain EXAM: CT ABDOMEN AND PELVIS WITH CONTRAST TECHNIQUE: Multidetector CT imaging of the abdomen and pelvis was performed using the standard protocol following bolus administration of intravenous contrast. RADIATION DOSE REDUCTION: This exam was performed according to the departmental dose-optimization program which includes automated exposure control, adjustment of the mA and/or kV according to patient size and/or use of iterative reconstruction technique. CONTRAST:  75mL OMNIPAQUE  IOHEXOL  350 MG/ML SOLN COMPARISON:  CT 02/16/2024 FINDINGS: Lower chest: Lung bases are clear. Hepatobiliary: No focal hepatic lesion. Normal gallbladder. No biliary duct dilatation. Common bile duct is normal. Pancreas: Pancreas is normal. No ductal dilatation. No pancreatic inflammation. Spleen: Normal spleen Adrenals/urinary tract: Adrenal glands and kidneys are normal. The ureters and bladder normal. Stomach/Bowel: The stomach, duodenum, and small bowel normal. Normal appendix. The colon and rectosigmoid colon are normal. Vascular/Lymphatic: Abdominal aorta normal caliber. Intimal calcifications are present. Infrarenal IVC filter noted. Venous collaterals in the suprapubic region. Reproductive: Uterus and adnexa unremarkable. Other: No free fluid. Musculoskeletal: No aggressive osseous lesion. IMPRESSION: 1. No acute findings in the abdomen pelvis. 2. Normal appendix. 3. Infrarenal IVC filter. Venous collaterals in the suprapubic region. Electronically Signed   By: Jackquline Boxer M.D.   On: 02/22/2024 17:25   DG FL GUIDED LUMBAR PUNCTURE Result Date: 02/20/2024 CLINICAL DATA:  68 year old female with a history of seizures who presented to the ED with fever, confusion, and headaches. Request for lumbar puncture. EXAM: LUMBAR PUNCTURE UNDER FLUOROSCOPY PROCEDURE: An appropriate skin entry site was determined fluoroscopically. Operator donned  sterile gloves and mask. Skin site was marked, then prepped with Betadine, draped in usual sterile fashion, and infiltrated locally with 1% lidocaine . A 20 gauge spinal needle advanced into the thecal sac at L5-S1 from a left interlaminar approach. Clear colorless CSF spontaneously returned, with opening pressure of 18 cm water. 15 ml CSF were collected and divided among 4 sterile vials for the requested laboratory studies. The needle was then removed. The patient tolerated the procedure well and there were no complications. FLUOROSCOPY: Radiation Exposure Index (as provided by the fluoroscopic device): 26.5 mGy Kerma IMPRESSION: Technically successful lumbar puncture under fluoroscopy. This exam was performed by Sherrilee Bal, PA-C, and was supervised and interpreted by Dr. DELENA Balder. Electronically Signed   By: Juliene Balder M.D.   On: 02/20/2024 14:45   US  EKG SITE RITE Result Date: 02/18/2024 If Site Rite image not attached, placement could not be confirmed due to current cardiac rhythm.  MR BRAIN W WO CONTRAST Result Date: 02/18/2024 CLINICAL DATA:  Mental status change, unknown cause EXAM: MRI HEAD WITHOUT AND  WITH CONTRAST TECHNIQUE: Multiplanar, multiecho pulse sequences of the brain and surrounding structures were obtained without and with intravenous contrast. CONTRAST:  6mL GADAVIST  GADOBUTROL  1 MMOL/ML IV SOLN COMPARISON:  CT head 02/16/2024. FINDINGS: Brain: Chronic encephalomalacia in the right occipital, parietal and posterior right frontal lobe. No evidence of acute infarct, acute hemorrhage, mass lesion or midline shift. No hydrocephalus. No abnormal enhancement. Vascular: Major arterial flow voids are maintained at the skull base. Skull and upper cervical spine: Normal marrow signal. Sinuses/Orbits: Clear sinuses.  No acute orbital findings. IMPRESSION: 1. No evidence of acute intracranial abnormality. 2. Chronic encephalomalacia in the right occipital, parietal and posterior right frontal lobe  Electronically Signed   By: Gilmore GORMAN Molt M.D.   On: 02/18/2024 00:21   EEG adult Result Date: 02/16/2024 Shelton Arlin KIDD, MD     02/16/2024  9:33 AM Patient Name: CHAQUANA NICHOLS MRN: [id number] Epilepsy Attending: Arlin KIDD Shelton Referring Physician/Provider: Debby Camila LABOR, MD Date: 02/16/2024 Duration: 23.47 mins Patient history: 68yo F with ams. EEG to evaluate for seizure. Level of alertness: Awake AEDs during EEG study: PHT, LCM Technical aspects: This EEG study was done with scalp electrodes positioned according to the 10-20 International system of electrode placement. Electrical activity was reviewed with band pass filter of 1-70Hz , sensitivity of 7 uV/mm, display speed of 32mm/sec with a 60Hz  notched filter applied as appropriate. EEG data were recorded continuously and digitally stored.  Video monitoring was available and reviewed as appropriate. Description: The posterior dominant rhythm consists of 8-9 Hz activity of moderate voltage (25-35 uV) seen predominantly in posterior head regions, symmetric and reactive to eye opening and eye closing. Sleep was characterized by sleep spindles (12-14hz ), maximal fronto-central region. EEG showed continuous polymorphic sharply contoured rhythmic 3 to 6 Hz theta-delta slowing in right hemisphere, maximal right posterior quadrant.  Intermittent generalized 3-5hz  theta- delta slowing was also noted abundant spike and waves were noted in right hemisphere, maximal right posterior quadrant, at times qasi- periodic at 1-2Hz .  Photic driving was not seen during photic stimulation.  Hyperventilation was not performed.    ABNORMALITY - Spike and waves, right hemisphere, maximal right posterior quadrant - Continuous slow, right hemisphere, maximal right posterior quadrant -Intermittent slow, generalized  IMPRESSION: This study showed evidence of epileptogenicity and cortical dysfunction arising from right hemisphere, maximal right posterior quadrant. This EEG  pattern is on the ictal-interictal continuum with potential for seizures.  Additionally there is mild to moderate diffuse encephalopathy.  No definite seizures were noted.  Arlin KIDD Shelton   CT ABDOMEN PELVIS W CONTRAST Result Date: 02/16/2024 CLINICAL DATA:  Acute nonlocalized abdominal pain, sepsis EXAM: CT ABDOMEN AND PELVIS WITH CONTRAST TECHNIQUE: Multidetector CT imaging of the abdomen and pelvis was performed using the standard protocol following bolus administration of intravenous contrast. RADIATION DOSE REDUCTION: This exam was performed according to the departmental dose-optimization program which includes automated exposure control, adjustment of the mA and/or kV according to patient size and/or use of iterative reconstruction technique. CONTRAST:  75mL OMNIPAQUE  IOHEXOL  350 MG/ML SOLN COMPARISON:  None Available. FINDINGS: Lower chest: No acute abnormality. Hepatobiliary: Riedel's lobe anatomy, an anatomic variant. Two small foci of portal-hepatic venous vascular shunting seen within the subcapsular, inferior right hepatic lobe. No enhancing intrahepatic mass. The gallbladder is unremarkable. There is mild extrahepatic biliary ductal dilation with bile duct measuring up to 10 mm in diameter and abruptly terminating at the ampulla in the region of the duodenal diverticulum. No intrahepatic biliary ductal dilation. Pancreas: Large  duodenal diverticulum within the head of the pancreas. The pancreas is otherwise unremarkable. Spleen: Unremarkable Adrenals/Urinary Tract: The adrenal glands are unremarkable. The kidneys are normal. The bladder is unremarkable. Stomach/Bowel: Stomach is within normal limits. Appendix appears normal. No evidence of bowel wall thickening, distention, or inflammatory changes. Vascular/Lymphatic: Extensive aortoiliac atherosclerotic calcification. No aortic aneurysm. Inferior vena cava filter is seen within the infrarenal cava. There is dilation of the left ovarian vein with  numerous left adnexal varices identified suggesting changes of ovarian vein reflux and pelvic venous insufficiency. Pudendal varices are seen bilaterally suggesting superficial venous insufficiency involving the saphenofemoral junctions bilaterally. No pathologic adenopathy within the abdomen and pelvis. Reproductive: Uterus and bilateral adnexa are unremarkable. Other: Tiny fat containing right inguinal hernia Musculoskeletal: No acute bone abnormality. No lytic or blastic bone lesion. Osseous structures are age appropriate. IMPRESSION: 1. No acute intra-abdominal pathology identified. 2. Mild extrahepatic biliary ductal dilation with bile duct measuring up to 10 mm in diameter and abruptly terminating at the ampulla in the region of a large duodenal diverticulum. This is nonspecific though mass effect by the duodenal diverticulum or an underlying stricture could result in this appearance. Correlation with liver function tests is recommended. If abnormal, ERCP or MRCP examination may be helpful for further evaluation. 3. Dilated left ovarian vein with numerous left adnexal varices suggesting changes of pelvic venous insufficiency. Additionally, bilateral superficial venous varices are seen involving the saphenofemoral junctions bilaterally suggest bilateral lower extremity superficial venous insufficiency. Electronically Signed   By: Dorethia Molt M.D.   On: 02/16/2024 03:10   CT Head Wo Contrast Result Date: 02/16/2024 CLINICAL DATA:  Mental status change, unknown cause EXAM: CT HEAD WITHOUT CONTRAST TECHNIQUE: Contiguous axial images were obtained from the base of the skull through the vertex without intravenous contrast. RADIATION DOSE REDUCTION: This exam was performed according to the departmental dose-optimization program which includes automated exposure control, adjustment of the mA and/or kV according to patient size and/or use of iterative reconstruction technique. COMPARISON:  CT head 06/15/2023  FINDINGS: Brain: No intracranial hemorrhage, mass effect, or evidence of acute infarct. No hydrocephalus. No extra-axial fluid collection. 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 Communication with the right lateral ventricle is unchanged. Stable ventricular size. Vascular: No hyperdense vessel. Intracranial arterial calcification. Skull: No fracture or focal lesion. Sinuses/Orbits: No acute finding. Other: None. IMPRESSION: 1. No acute intracranial abnormality. 2. 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 Electronically Signed   By: Norman Gatlin M.D.   On: 02/16/2024 00:12   DG Chest Portable 1 View Result Date: 02/15/2024 CLINICAL DATA:  Sepsis, altered mental status EXAM: PORTABLE CHEST 1 VIEW COMPARISON:  03/25/2023 FINDINGS: Lungs are well expanded, symmetric, and clear. No pneumothorax or pleural effusion. Cardiac size within normal limits. Pulmonary vascularity is normal. Osseous structures are age-appropriate. Healed right mid diaphyseal clavicle fracture. No acute bone abnormality. IMPRESSION: No active disease. Electronically Signed   By: Dorethia Molt M.D.   On: 02/15/2024 23:32     TODAY-DAY OF DISCHARGE:  Subjective:   [name] today has no headache,no chest abdominal pain,no new weakness tingling or numbness, feels much better wants to go home today.   Objective:   Blood pressure 119/66, pulse 78, temperature 98.9 F (37.2 C), temperature source Oral, resp. rate (!) 25, height 5' 7 (1.702 m), weight 59 kg, SpO2 94%.  Intake/Output Summary (Last 24 hours) at 02/23/2024 0921 Last data filed at 02/23/2024 0414 Gross  per 24 hour  Intake --  Output 1700 ml  Net -1700 ml   Filed Weights   02/15/24 2247  Weight: 59 kg    Exam: Awake Alert, Oriented *3, No new F.N deficits, Normal affect [place].AT,PERRAL Supple Neck,No JVD, No cervical lymphadenopathy appriciated.   Symmetrical Chest wall movement, Good air movement bilaterally, CTAB RRR,No Gallops,Rubs or new Murmurs, No Parasternal Heave +ve B.Sounds, Abd Soft, Non tender, No organomegaly appriciated, No rebound -guarding or rigidity. No Cyanosis, Clubbing or edema, No new Rash or bruise   PERTINENT RADIOLOGIC STUDIES: CT ABDOMEN PELVIS W CONTRAST Result Date: 02/22/2024 CLINICAL DATA:  Abdominal pain, acute nonlocalized RIGHT lower quadrant pain EXAM: CT ABDOMEN AND PELVIS WITH CONTRAST TECHNIQUE: Multidetector CT imaging of the abdomen and pelvis was performed using the standard protocol following bolus administration of intravenous contrast. RADIATION DOSE REDUCTION: This exam was performed according to the departmental dose-optimization program which includes automated exposure control, adjustment of the mA and/or kV according to patient size and/or use of iterative reconstruction technique. CONTRAST:  75mL OMNIPAQUE  IOHEXOL  350 MG/ML SOLN COMPARISON:  CT 02/16/2024 FINDINGS: Lower chest: Lung bases are clear. Hepatobiliary: No focal hepatic lesion. Normal gallbladder. No biliary duct dilatation. Common bile duct is normal. Pancreas: Pancreas is normal. No ductal dilatation. No pancreatic inflammation. Spleen: Normal spleen Adrenals/urinary tract: Adrenal glands and kidneys are normal. The ureters and bladder normal. Stomach/Bowel: The stomach, duodenum, and small bowel normal. Normal appendix. The colon and rectosigmoid colon are normal. Vascular/Lymphatic: Abdominal aorta normal caliber. Intimal calcifications are present. Infrarenal IVC filter noted. Venous collaterals in the suprapubic region. Reproductive: Uterus and adnexa unremarkable. Other: No free fluid. Musculoskeletal: No aggressive osseous lesion. IMPRESSION: 1. No acute findings in the abdomen pelvis. 2. Normal appendix. 3. Infrarenal IVC filter. Venous collaterals in the suprapubic region. Electronically Signed   By: Jackquline Boxer M.D.   On:  02/22/2024 17:25     PERTINENT LAB RESULTS: CBC: Recent Labs    02/21/24 0522 02/22/24 0451  WBC 3.9* 4.3  HGB 10.0* 10.5*  HCT 30.3* 31.8*  PLT 223 275   CMET CMP     Component Value Date/Time   NA 138 02/22/2024 0451   NA 145 (H) 06/10/2013 0812   K 4.0 02/22/2024 0451   CL 103 02/22/2024 0451   CO2 25 02/22/2024 0451   GLUCOSE 102 (H) 02/22/2024 0451   BUN 7 (L) 02/22/2024 0451   BUN 13 06/10/2013 0812   CREATININE 0.70 02/22/2024 0451   CREATININE 0.85 12/31/2022 0833   CALCIUM  8.9 02/22/2024 0451   PROT 5.3 (L) 02/19/2024 0346   PROT 6.8 10/10/2020 0935   ALBUMIN 1.9 (L) 02/19/2024 0346   ALBUMIN 4.6 10/10/2020 0935   AST 21 02/19/2024 0346   AST 13 (L) 12/31/2022 0833   ALT 19 02/19/2024 0346   ALT 10 12/31/2022 0833   ALKPHOS 61 02/19/2024 0346   BILITOT 0.4 02/19/2024 0346   BILITOT 0.3 12/31/2022 0833   GFRNONAA >60 02/22/2024 0451   GFRNONAA >60 12/31/2022 0833    GFR Estimated Creatinine Clearance: 65.3 mL/min (by C-G formula based on SCr of 0.7 mg/dL). No results for input(s): LIPASE, AMYLASE in the last 72 hours. No results for input(s): CKTOTAL, CKMB, CKMBINDEX, TROPONINI in the last 72 hours. Invalid input(s): POCBNP No results for input(s): DDIMER in the last 72 hours. No results for input(s): HGBA1C in the last 72 hours. No results for input(s): CHOL, HDL, LDLCALC, TRIG, CHOLHDL, LDLDIRECT in the last 72 hours. No  results for input(s): TSH, T4TOTAL, T3FREE, THYROIDAB in the last 72 hours.  Invalid input(s): FREET3 No results for input(s): VITAMINB12, FOLATE, FERRITIN, TIBC, IRON, RETICCTPCT in the last 72 hours. Coags: Recent Labs    02/22/24 0451 02/23/24 0408  INR 1.1 1.1   Microbiology: Recent Results (from the past 240 hours)  Resp panel by RT-PCR (RSV, Flu A&B, Covid) Anterior Nasal Swab     Status: None   Collection Time: 02/15/24 11:46 PM   Specimen: Anterior Nasal Swab   Result Value Ref Range Status   SARS Coronavirus 2 by RT PCR NEGATIVE NEGATIVE Final   Influenza A by PCR NEGATIVE NEGATIVE Final   Influenza B by PCR NEGATIVE NEGATIVE Final    Comment: (NOTE) The Xpert Xpress SARS-CoV-2/FLU/RSV plus assay is intended as an aid in the diagnosis of influenza from Nasopharyngeal swab specimens and should not be used as a sole basis for treatment. Nasal washings and aspirates are unacceptable for Xpert Xpress SARS-CoV-2/FLU/RSV testing.  Fact Sheet for Patients: BloggerCourse.com  Fact Sheet for Healthcare Providers: SeriousBroker.it  This test is not yet approved or cleared by the [place]  FDA and has been authorized for detection and/or diagnosis of SARS-CoV-2 by FDA under an Emergency Use Authorization (EUA). This EUA will remain in effect (meaning this test can be used) for the duration of the COVID-19 declaration under Section 564(b)(1) of the Act, 21 U.S.C. section 360bbb-3(b)(1), unless the authorization is terminated or revoked.     Resp Syncytial Virus by PCR NEGATIVE NEGATIVE Final    Comment: (NOTE) Fact Sheet for Patients: BloggerCourse.com  Fact Sheet for Healthcare Providers: SeriousBroker.it  This test is not yet approved or cleared by the [place]  FDA and has been authorized for detection and/or diagnosis of SARS-CoV-2 by FDA under an Emergency Use Authorization (EUA). This EUA will remain in effect (meaning this test can be used) for the duration of the COVID-19 declaration under Section 564(b)(1) of the Act, 21 U.S.C. section 360bbb-3(b)(1), unless the authorization is terminated or revoked.  Performed at [facility], [address], [place], [place] [address]   Respiratory (~20 pathogens) panel by PCR     Status: None   Collection Time: 02/15/24 11:46 PM   Specimen: Nasopharyngeal Swab; Respiratory   Result Value Ref Range Status   Adenovirus NOT DETECTED NOT DETECTED Final   Coronavirus 229E NOT DETECTED NOT DETECTED Final    Comment: (NOTE) The Coronavirus on the Respiratory Panel, DOES NOT test for the novel  Coronavirus (2019 nCoV)    Coronavirus HKU1 NOT DETECTED NOT DETECTED Final   Coronavirus NL63 NOT DETECTED NOT DETECTED Final   Coronavirus OC43 NOT DETECTED NOT DETECTED Final   Metapneumovirus NOT DETECTED NOT DETECTED Final   Rhinovirus / Enterovirus NOT DETECTED NOT DETECTED Final   Influenza A NOT DETECTED NOT DETECTED Final   Influenza B NOT DETECTED NOT DETECTED Final   Parainfluenza Virus 1 NOT DETECTED NOT DETECTED Final   Parainfluenza Virus 2 NOT DETECTED NOT DETECTED Final   Parainfluenza Virus 3 NOT DETECTED NOT DETECTED Final   Parainfluenza Virus 4 NOT DETECTED NOT DETECTED Final   Respiratory Syncytial Virus NOT DETECTED NOT DETECTED Final   Bordetella pertussis NOT DETECTED NOT DETECTED Final   Bordetella Parapertussis NOT DETECTED NOT DETECTED Final   Chlamydophila pneumoniae NOT DETECTED NOT DETECTED Final   Mycoplasma pneumoniae NOT DETECTED NOT DETECTED Final    Comment: Performed at [facility], [address], [place], [place] [address]  MRSA Next Gen by PCR, Nasal     Status: None   Collection Time: 02/16/24  8:40 AM   Specimen: Urine, Clean Catch; Nasal Swab  Result Value Ref Range Status   MRSA by PCR Next Gen NOT DETECTED NOT DETECTED Final    Comment: (NOTE) The GeneXpert MRSA Assay (FDA approved for NASAL specimens only), is one component of a comprehensive MRSA colonization surveillance program. It is not intended to diagnose MRSA infection nor to guide or monitor treatment for MRSA infections. Test performance is not FDA approved in patients less than 39 years old. Performed at [facility], [address], [place], [place] [address]   Blood culture (routine x 2)     Status: None   Collection Time: 02/16/24  9:33 PM    Specimen: BLOOD  Result Value Ref Range Status   Specimen Description BLOOD SITE NOT SPECIFIED  Final   Special Requests   Final    BOTTLES DRAWN AEROBIC AND ANAEROBIC Blood Culture adequate volume   Culture   Final    NO GROWTH 5 DAYS Performed at [facility], [address], [place], [place] [address]    Report Status 02/21/2024 FINAL  Final  Blood culture (routine x 2)     Status: None   Collection Time: 02/16/24  9:34 PM   Specimen: BLOOD  Result Value Ref Range Status   Specimen Description BLOOD SITE NOT SPECIFIED  Final   Special Requests   Final    BOTTLES DRAWN AEROBIC AND ANAEROBIC Blood Culture adequate volume   Culture   Final    NO GROWTH 5 DAYS Performed at [facility], [address] [place], [place] [address]    Report Status 02/21/2024 FINAL  Final  Culture, blood (Routine X 2) w Reflex to ID Panel     Status: None   Collection Time: 02/17/24 12:10 PM   Specimen: BLOOD RIGHT ARM  Result Value Ref Range Status   Specimen Description BLOOD RIGHT ARM  Final   Special Requests   Final    BOTTLES DRAWN AEROBIC ONLY Blood Culture adequate volume   Culture   Final    NO GROWTH 5 DAYS Performed at [facility], [address], [place], [place] [address]    Report Status 02/22/2024 FINAL  Final  Culture, blood (Routine X 2) w Reflex to ID Panel     Status: None   Collection Time: 02/17/24 12:11 PM   Specimen: BLOOD LEFT HAND  Result Value Ref Range Status   Specimen Description BLOOD LEFT HAND  Final   Special Requests   Final    BOTTLES DRAWN AEROBIC ONLY Blood Culture results may not be optimal due to an inadequate volume of blood received in culture bottles   Culture   Final    NO GROWTH 5 DAYS Performed at [facility], [address] [place], [place] [address]    Report Status 02/22/2024 FINAL  Final  SARS Coronavirus 2 by RT PCR (hospital order, performed in [facility] hospital lab) *cepheid single result test* Anterior Nasal  Swab     Status: None   Collection Time: 02/18/24  2:10 PM   Specimen: Anterior Nasal Swab  Result Value Ref Range Status   SARS Coronavirus 2 by RT PCR NEGATIVE NEGATIVE Final    Comment: Performed at [facility], [address] [place], [place] [address]  Acid Fast Smear (AFB)  Status: None   Collection Time: 02/19/24  2:03 PM   Specimen: CSF  Result Value Ref Range Status   AFB Specimen Processing Concentration  Final   Acid Fast Smear Negative  Final    Comment: (NOTE) Performed At: [facility] [address] [place], [place] [id number] Jennette Shorter MD Ey:[id number]    Source (AFB) LUMBAR PUNCTURE  Final    Comment: Performed at [facility], [address], [place], [place] [address]  CSF culture w Gram Stain     Status: None (Preliminary result)   Collection Time: 02/19/24  2:07 PM   Specimen: CSF; Cerebrospinal Fluid  Result Value Ref Range Status   Specimen Description CSF  Final   Special Requests LUMBAR PUNCTURE  Final   Gram Stain NO WBC SEEN NO ORGANISMS SEEN CYTOSPIN SMEAR   Final   Culture   Final    NO GROWTH 2 DAYS Performed at [facility], [address] [place], [place] [address]    Report Status PENDING  Incomplete  Culture, fungus without smear     Status: None (Preliminary result)   Collection Time: 02/19/24  2:07 PM   Specimen: CSF; Cerebrospinal Fluid  Result Value Ref Range Status   Specimen Description CSF  Final   Special Requests LUMBAR PUNCTURE  Final   Culture   Final    NO FUNGUS ISOLATED AFTER 2 DAYS Performed at [facility], [address] [place], [place] [address]    Report Status PENDING  Incomplete    FURTHER DISCHARGE INSTRUCTIONS:  Get Medicines reviewed and adjusted: Please take all your medications with you for your next visit with your Primary MD  Laboratory/radiological data: Please request your Primary MD to go over all hospital tests and procedure/radiological results at the  follow up, please ask your Primary MD to get all Hospital records sent to his/her office.  In some cases, they will be blood work, cultures and biopsy results pending at the time of your discharge. Please request that your primary care M.D. goes through all the records of your hospital data and follows up on these results.  Also Note the following: If you experience worsening of your admission symptoms, develop shortness of breath, life threatening emergency, suicidal or homicidal thoughts you must seek medical attention immediately by calling 911 or calling your MD immediately  if symptoms less severe.  You must read complete instructions/literature along with all the possible adverse reactions/side effects for all the Medicines you take and that have been prescribed to you. Take any new Medicines after you have completely understood and accpet all the possible adverse reactions/side effects.   Do not drive when taking Pain medications or sleeping medications (Benzodaizepines)  Do not take more than prescribed Pain, Sleep and Anxiety Medications. It is not advisable to combine anxiety,sleep and pain medications without talking with your primary care practitioner  Special Instructions: If you have smoked or chewed Tobacco  in the last 2 yrs please stop smoking, stop any regular Alcohol  and or any Recreational drug use.  Wear Seat belts while driving.  Please note: You were cared for by a hospitalist during your hospital stay. Once you are discharged, your primary care physician will handle any further medical issues. Please note that NO REFILLS for any discharge medications will be authorized once you are discharged, as it is imperative that you return to your primary care physician (or establish a relationship with a primary care physician if  you do not have one) for your post hospital discharge needs so that they can reassess your need for medications and monitor your lab values.  Total Time  spent coordinating discharge including counseling, education and face to face time equals greater than 30 minutes.  SignedBETHA Donalda Applebaum 02/23/2024 9:21 AM

## 2024-02-23 NOTE — Plan of Care (Signed)
  Problem: Health Behavior/Discharge Planning: Goal: Ability to manage health-related needs will improve Outcome: Progressing   Problem: Clinical Measurements: Goal: Will remain free from infection Outcome: Progressing   Problem: Activity: Goal: Risk for activity intolerance will decrease Outcome: Progressing   Problem: Coping: Goal: Level of anxiety will decrease Outcome: Progressing   Problem: Safety: Goal: Ability to remain free from injury will improve Outcome: Progressing   

## 2024-02-23 NOTE — TOC Transition Note (Signed)
 Transition of Care [facility]) - Discharge Note   Patient Details  Name: [name] MRN: [id number] Date of Birth: [birth year]  [facility] CM/SW Contact:  Marval Gell, RN Phone Number: 02/23/2024, 10:09 AM   Clinical Narrative:     Beatris w patient's daughter [name] to set up Dc needs. She states that they have all needed DME at home and would like to continue with [facility]. Leopoldo confirmed they are accepting and restarting. Daughter to provide transportation home.  Please take to [facility].   Siciliano,[name] (Daughter) [phone]   Final next level of care: Home w Home Health Services Barriers to Discharge: No Barriers Identified   Patient Goals and CMS Choice Patient states their goals for this hospitalization and ongoing recovery are:: to go home CMS Medicare.gov Compare Post Acute Care list provided to:: Patient Represenative (must comment) Choice offered to / list presented to : Adult Children      Discharge Placement                       Discharge Plan and Services Additional resources added to the After Visit Summary for     Discharge Planning Services: CM Consult            DME Arranged: N/A         HH Arranged: OT, PT HH Agency: [facility] Date [facility] Agency Contacted: 02/23/24 Time HH Agency Contacted: 1009 Representative spoke with at [facility] Agency: Amy  Social Drivers of Health (SDOH) Interventions SDOH Screenings   Food Insecurity: No Food Insecurity (02/16/2024)  Housing: Low Risk  (02/16/2024)  Transportation Needs: No Transportation Needs (02/16/2024)  Utilities: Not At Risk (02/16/2024)  Alcohol Screen: Low Risk  (03/11/2023)  Depression (PHQ2-9): Low Risk  (03/11/2023)  Financial Resource Strain: Low Risk  (12/31/2022)  Social Connections: Moderately Integrated (02/16/2024)  Tobacco Use: Medium Risk (12/31/2023)     Readmission Risk Interventions     No data to display

## 2024-02-25 ENCOUNTER — Ambulatory Visit (HOSPITAL_COMMUNITY): Admission: RE | Admit: 2024-02-25 | Source: Home / Self Care | Admitting: Internal Medicine

## 2024-02-25 ENCOUNTER — Other Ambulatory Visit: Payer: Self-pay | Admitting: Neurology

## 2024-02-26 ENCOUNTER — Other Ambulatory Visit: Payer: Self-pay

## 2024-02-26 DIAGNOSIS — Z1211 Encounter for screening for malignant neoplasm of colon: Secondary | ICD-10-CM

## 2024-02-26 DIAGNOSIS — K5904 Chronic idiopathic constipation: Secondary | ICD-10-CM

## 2024-02-26 DIAGNOSIS — R634 Abnormal weight loss: Secondary | ICD-10-CM

## 2024-02-26 LAB — OLIGOCLONAL BANDS, CSF + SERM

## 2024-02-26 NOTE — Telephone Encounter (Signed)
**Note De-identified  [place] Obfuscation** Please advise 

## 2024-02-26 NOTE — Telephone Encounter (Signed)
 Pts procedure rescheduled at [facility] on 05/23/24. Pt to arrive at 7:30am for 9am procedure. Daughter aware of appt and will get in touch with PCP regarding new script for lovenox . New instructions sent to pt via mychart. Rjdz#[id number].

## 2024-02-26 NOTE — Telephone Encounter (Signed)
 Last OV 12/14/23-please advise

## 2024-02-26 NOTE — Telephone Encounter (Signed)
 Pts procedure rescheduled at [facility] on 05/23/24. Pt to arrive there at 7:30am, procedure at 9am. Case#[id number].

## 2024-02-27 DIAGNOSIS — G40909 Epilepsy, unspecified, not intractable, without status epilepticus: Secondary | ICD-10-CM | POA: Diagnosis not present

## 2024-02-27 DIAGNOSIS — M519 Unspecified thoracic, thoracolumbar and lumbosacral intervertebral disc disorder: Secondary | ICD-10-CM | POA: Diagnosis not present

## 2024-02-27 DIAGNOSIS — I951 Orthostatic hypotension: Secondary | ICD-10-CM | POA: Diagnosis not present

## 2024-02-27 DIAGNOSIS — C50912 Malignant neoplasm of unspecified site of left female breast: Secondary | ICD-10-CM | POA: Diagnosis not present

## 2024-02-27 DIAGNOSIS — I480 Paroxysmal atrial fibrillation: Secondary | ICD-10-CM | POA: Diagnosis not present

## 2024-02-27 DIAGNOSIS — D696 Thrombocytopenia, unspecified: Secondary | ICD-10-CM | POA: Diagnosis not present

## 2024-02-27 DIAGNOSIS — M549 Dorsalgia, unspecified: Secondary | ICD-10-CM | POA: Diagnosis not present

## 2024-02-27 DIAGNOSIS — K219 Gastro-esophageal reflux disease without esophagitis: Secondary | ICD-10-CM | POA: Diagnosis not present

## 2024-02-27 DIAGNOSIS — G8929 Other chronic pain: Secondary | ICD-10-CM | POA: Diagnosis not present

## 2024-02-27 DIAGNOSIS — G629 Polyneuropathy, unspecified: Secondary | ICD-10-CM | POA: Diagnosis not present

## 2024-02-27 DIAGNOSIS — J4489 Other specified chronic obstructive pulmonary disease: Secondary | ICD-10-CM | POA: Diagnosis not present

## 2024-02-27 DIAGNOSIS — Z7901 Long term (current) use of anticoagulants: Secondary | ICD-10-CM | POA: Diagnosis not present

## 2024-03-01 DIAGNOSIS — G8929 Other chronic pain: Secondary | ICD-10-CM | POA: Diagnosis not present

## 2024-03-01 DIAGNOSIS — I480 Paroxysmal atrial fibrillation: Secondary | ICD-10-CM | POA: Diagnosis not present

## 2024-03-01 DIAGNOSIS — G629 Polyneuropathy, unspecified: Secondary | ICD-10-CM | POA: Diagnosis not present

## 2024-03-01 DIAGNOSIS — G40909 Epilepsy, unspecified, not intractable, without status epilepticus: Secondary | ICD-10-CM | POA: Diagnosis not present

## 2024-03-01 DIAGNOSIS — M519 Unspecified thoracic, thoracolumbar and lumbosacral intervertebral disc disorder: Secondary | ICD-10-CM | POA: Diagnosis not present

## 2024-03-01 DIAGNOSIS — J4489 Other specified chronic obstructive pulmonary disease: Secondary | ICD-10-CM | POA: Diagnosis not present

## 2024-03-01 DIAGNOSIS — C50912 Malignant neoplasm of unspecified site of left female breast: Secondary | ICD-10-CM | POA: Diagnosis not present

## 2024-03-01 DIAGNOSIS — I951 Orthostatic hypotension: Secondary | ICD-10-CM | POA: Diagnosis not present

## 2024-03-01 DIAGNOSIS — M549 Dorsalgia, unspecified: Secondary | ICD-10-CM | POA: Diagnosis not present

## 2024-03-01 DIAGNOSIS — D696 Thrombocytopenia, unspecified: Secondary | ICD-10-CM | POA: Diagnosis not present

## 2024-03-02 DIAGNOSIS — Z8619 Personal history of other infectious and parasitic diseases: Secondary | ICD-10-CM | POA: Diagnosis not present

## 2024-03-03 DIAGNOSIS — G40909 Epilepsy, unspecified, not intractable, without status epilepticus: Secondary | ICD-10-CM | POA: Diagnosis not present

## 2024-03-03 DIAGNOSIS — C50912 Malignant neoplasm of unspecified site of left female breast: Secondary | ICD-10-CM | POA: Diagnosis not present

## 2024-03-03 DIAGNOSIS — G629 Polyneuropathy, unspecified: Secondary | ICD-10-CM | POA: Diagnosis not present

## 2024-03-03 DIAGNOSIS — I951 Orthostatic hypotension: Secondary | ICD-10-CM | POA: Diagnosis not present

## 2024-03-03 DIAGNOSIS — I480 Paroxysmal atrial fibrillation: Secondary | ICD-10-CM | POA: Diagnosis not present

## 2024-03-03 DIAGNOSIS — D696 Thrombocytopenia, unspecified: Secondary | ICD-10-CM | POA: Diagnosis not present

## 2024-03-03 DIAGNOSIS — G8929 Other chronic pain: Secondary | ICD-10-CM | POA: Diagnosis not present

## 2024-03-03 DIAGNOSIS — M519 Unspecified thoracic, thoracolumbar and lumbosacral intervertebral disc disorder: Secondary | ICD-10-CM | POA: Diagnosis not present

## 2024-03-03 DIAGNOSIS — J4489 Other specified chronic obstructive pulmonary disease: Secondary | ICD-10-CM | POA: Diagnosis not present

## 2024-03-03 DIAGNOSIS — M549 Dorsalgia, unspecified: Secondary | ICD-10-CM | POA: Diagnosis not present

## 2024-03-05 LAB — CULTURE, FUNGUS WITHOUT SMEAR

## 2024-03-09 DIAGNOSIS — Z86718 Personal history of other venous thrombosis and embolism: Secondary | ICD-10-CM | POA: Diagnosis not present

## 2024-03-16 ENCOUNTER — Other Ambulatory Visit: Payer: Self-pay | Admitting: Neurology

## 2024-03-16 DIAGNOSIS — Z86718 Personal history of other venous thrombosis and embolism: Secondary | ICD-10-CM | POA: Diagnosis not present

## 2024-03-18 ENCOUNTER — Other Ambulatory Visit: Payer: Self-pay | Admitting: Neurology

## 2024-03-18 ENCOUNTER — Telehealth: Payer: Self-pay | Admitting: Neurology

## 2024-03-18 NOTE — Telephone Encounter (Signed)
 Pt cld and needs VIMPAT  200 MG TABS tablet [facility] [address] [place] [phone]

## 2024-03-18 NOTE — Telephone Encounter (Signed)
 Spoke with pt's daughter. She stated her mom was confused and just needs to get the vimpat  at a different [facility]. She is going to have [facility] transfer it to a different once that has it in stock. She stated we didn't need to do anything.

## 2024-03-21 ENCOUNTER — Inpatient Hospital Stay: Payer: Medicare HMO | Attending: Hematology and Oncology | Admitting: Hematology and Oncology

## 2024-03-21 ENCOUNTER — Other Ambulatory Visit (HOSPITAL_COMMUNITY): Payer: Self-pay

## 2024-03-21 ENCOUNTER — Encounter: Payer: Self-pay | Admitting: Neurology

## 2024-03-21 ENCOUNTER — Telehealth: Payer: Self-pay | Admitting: Neurology

## 2024-03-21 VITALS — BP 129/50 | HR 69 | Temp 98.5°F | Resp 14 | Wt 127.8 lb

## 2024-03-21 DIAGNOSIS — C50412 Malignant neoplasm of upper-outer quadrant of left female breast: Secondary | ICD-10-CM | POA: Diagnosis not present

## 2024-03-21 DIAGNOSIS — Z170 Estrogen receptor positive status [ER+]: Secondary | ICD-10-CM | POA: Insufficient documentation

## 2024-03-21 DIAGNOSIS — Z8049 Family history of malignant neoplasm of other genital organs: Secondary | ICD-10-CM | POA: Insufficient documentation

## 2024-03-21 DIAGNOSIS — Z87891 Personal history of nicotine dependence: Secondary | ICD-10-CM | POA: Insufficient documentation

## 2024-03-21 DIAGNOSIS — M810 Age-related osteoporosis without current pathological fracture: Secondary | ICD-10-CM | POA: Diagnosis not present

## 2024-03-21 DIAGNOSIS — Z79811 Long term (current) use of aromatase inhibitors: Secondary | ICD-10-CM | POA: Diagnosis not present

## 2024-03-21 MED ORDER — VIMPAT 200 MG PO TABS
200.0000 mg | ORAL_TABLET | Freq: Two times a day (BID) | ORAL | 5 refills | Status: DC
  Filled 2024-03-21: qty 50, 25d supply, fill #0
  Filled 2024-03-21: qty 10, 5d supply, fill #0
  Filled 2024-03-21: qty 60, 30d supply, fill #0

## 2024-03-21 MED ORDER — ALENDRONATE SODIUM 70 MG PO TABS: 70.0000 mg | ORAL_TABLET | ORAL | 3 refills | Status: AC

## 2024-03-21 MED ORDER — ANASTROZOLE 1 MG PO TABS
1.0000 mg | ORAL_TABLET | Freq: Every day | ORAL | 3 refills | Status: AC
Start: 2024-03-21 — End: ?

## 2024-03-21 MED ORDER — VIMPAT 200 MG PO TABS: 200.0000 mg | ORAL_TABLET | Freq: Two times a day (BID) | ORAL | 5 refills | Status: DC

## 2024-03-21 NOTE — Telephone Encounter (Signed)
 Left message on voicemail to check the pharmacy.

## 2024-03-21 NOTE — Telephone Encounter (Signed)
 Pls let daughter know I sent in the prescription first to [facility] from her MyChart message, then saw her phone note now and sent Vimpat  to [facility] pharmacy. Let us  know if any issues. Thanks

## 2024-03-21 NOTE — Progress Notes (Signed)
 BRIEF ONCOLOGIC HISTORY:  Oncology History  Malignant neoplasm of upper-outer quadrant of left breast in female, estrogen receptor positive (HCC)  12/15/2022 Mammogram   IMPRESSION: 1. Suspicious 1.1 cm OUTER LEFT breast mass. Tissue sampling is recommended. No abnormal appearing LEFT axillary lymph nodes. 2. No other significant mammographic findings within either breast.   12/22/2022 Pathology Results   Pathology showed grade 2 IDC, ER 95% moderate-strong staining, PR neg. Her 2 neg.   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  The report date is January 08, 2023.  The Multi-Cancer + RNA Panel offered by [facility] includes sequencing and/or deletion/duplication analysis of the following 70 genes:  AIP*, ALK, APC*, ATM*, AXIN2*, BAP1*, BARD1*, BLM*, BMPR1A*, BRCA1*, BRCA2*, BRIP1*, CDC73*, CDH1*, CDK4, CDKN1B*, CDKN2A, CHEK2*, CTNNA1*, DICER1*, EPCAM (del/dup only), EGFR, FH*, FLCN*, GREM1 (promoter dup only), HOXB13, KIT, LZTR1, MAX*, MBD4, MEN1*, MET, MITF, MLH1*, MSH2*, MSH3*, MSH6*, MUTYH*, NF1*, NF2*, NTHL1*, PALB2*, PDGFRA, PMS2*, POLD1*, POLE*, POT1*, PRKAR1A*, PTCH1*, PTEN*, RAD51C*, RAD51D*, RB1*, RET, SDHA* (sequencing only), SDHAF2*, SDHB*, SDHC*, SDHD*, SMAD4*, SMARCA4*, SMARCB1*, SMARCE1*, STK11*, SUFU*, TMEM127*, TP53*, TSC1*, TSC2*, VHL*. RNA analysis is performed for * genes.    01/12/2023 Surgery   Left breast  lumpectomy: IDC, 1.6cm, grade 2, margins negative, 1 SLN negative for cancer   01/12/2023 Oncotype testing   19/6%   01/12/2023 Cancer Staging   Staging form: Breast, AJCC 8th Edition - Pathologic stage from 01/12/2023: Stage IA (pT1c, pN0, cM0, G2, ER+, PR-, HER2-) - Signed by Crawford Morna Pickle, NP on 09/22/2023 Stage prefix: Initial diagnosis Histologic grading system: 3 grade system   03/18/2023 - 04/17/2023 Radiation Therapy   Plan Name: Breast_L Site: Breast, Left Technique: 3D Mode: Photon Dose Per Fraction: 2.67 Gy Prescribed Dose (Delivered / Prescribed): 40.05 Gy / 40.05 Gy Prescribed Fxs (Delivered / Prescribed): 15 / 15   Plan Name: Breast_Lt_Bst Site: Breast, Left Technique: 3D Mode: Photon Dose Per Fraction: 2 Gy Prescribed Dose (Delivered / Prescribed): 12 Gy / 12 Gy Prescribed Fxs (Delivered / Prescribed): 6 / 6       06/2023 -  Anti-estrogen oral therapy   Anastrozole       INTERVAL HISTORY:   Discussed the use of AI scribe software for clinical note transcription with the patient, who gave verbal consent to proceed.  History of Present Illness [name] is a 68 year old female with breast cancer and osteoporosis who presents for a follow-up visit.  She has been taking anastrozole  for nearly a year for breast cancer with no side effects. Her last mammogram in May was normal, and she is scheduled for another in a year.  She was recently hospitalized for eight days due to a high fever and suspected urinary tract infection, sepsis, and meningitis. A lumbar puncture was performed, but results were inconclusive. She was treated with antibiotics, which resolved her symptoms, and she was discharged home. She is currently undergoing physical and occupational therapy at home, as she was unable to walk  post-hospitalization.  During the fever, she experienced a breakthrough seizure, which is unusual for her as she had brain surgery 36 years ago, after which her  seizures began.  She has osteoporosis and was previously prescribed Fosamax , which she discontinued in May. She is not currently taking any medication for osteoporosis.  She uses dentures and has no natural teeth. She reports being able to perform household activities such as washing clothes and making her bed, indicating some recovery of strength.   REVIEW OF SYSTEMS:  Review of Systems  Constitutional:  Negative for appetite change, chills, fatigue, fever and unexpected weight change.  HENT:   Negative for hearing loss, lump/mass and trouble swallowing.   Eyes:  Negative for eye problems and icterus.  Respiratory:  Negative for chest tightness, cough and shortness of breath.   Cardiovascular:  Negative for chest pain, leg swelling and palpitations.  Gastrointestinal:  Negative for abdominal distention, abdominal pain, constipation, diarrhea, nausea and vomiting.  Endocrine: Negative for hot flashes.  Genitourinary:  Negative for difficulty urinating.   Musculoskeletal:  Negative for arthralgias.  Skin:  Negative for itching and rash.  Neurological:  Negative for dizziness, extremity weakness, headaches and numbness.  Hematological:  Negative for adenopathy. Does not bruise/bleed easily.  Psychiatric/Behavioral:  Negative for depression. The patient is not nervous/anxious.    Breast: Denies any new nodularity, masses, tenderness, nipple changes, or nipple discharge.       PAST MEDICAL/SURGICAL HISTORY:  Past Medical History:  Diagnosis Date   Acute bronchitis 08/06/2022   Acute respiratory failure with hypoxia (HCC) 03/31/2015   Altered mental status    Asthma    Ataxia 08/26/2019   Atypical chest pain 02/17/2017   Cerebral hemorrhage (HCC) 1989   Chronic back pain    Closed fracture of distal end of left radius 07/17/2022   Closed fracture of fifth metatarsal bone 01/04/2019   Closed fracture of proximal phalanx of great toe 01/04/2019   Closed nondisplaced fracture of  styloid process of left radius 08/06/2022   COPD (chronic obstructive pulmonary disease) (HCC)    no meds per daughter   COPD with exacerbation (HCC) 03/30/2015   Costochondritis 02/17/2017   COVID-19 virus infection 04/02/2020   DDD (degenerative disc disease) 06/12/2013   DVT (deep venous thrombosis) (HCC)    she's had several since 1990   GERD (gastroesophageal reflux disease)    no meds per daughter   Headache    usually around time when she's had a seizure   History of blood transfusion    when she was a baby   History of kidney stones    History of stomach ulcers    Hypoxia 01/18/2015   Lactic acidosis 04/02/2020   Neuropathy    Seizures (HCC)    because of her brain surgery  last one 07/28/22   Sepsis secondary to UTI (HCC) 01/17/2015   Stroke [facility]) 1990's   family denies residual on 11/09/2014   Tunnel vision    since brain OR   Past Surgical History:  Procedure Laterality Date   BRAIN SURGERY  08/05/1987   craniotomy with hematoma evacuation   BREAST BIOPSY Left 12/22/2022   US  LT BREAST BX W LOC DEV 1ST LESION IMG BX SPEC US  GUIDE 12/22/2022 [facility]AMMO[facility]AP[facility]   BREAST BIOPSY  01/09/2023   MM LT RADIOACTIVE SEED LOC MAMMO GUIDE 01/09/2023 [facility] MAMMOGR[facility]   BREAST LUMPECTOMY WITH RADIOACTIVE SEED AND SENTINEL LYMPH NODE BIOPSY Left 01/12/2023   Procedure: LEFT BREAST LUMPECTOMY WITH RADIOACTIVE  SEED AND SENTINEL LYMPH NODE BIOPSY;  Surgeon: Belinda Cough, MD;  Location: [facility];  Service: General;  Laterality: Left;   CESAREAN SECTION  1979; 1987; 1989   HEMORRHOID SURGERY     IVC FILTER INSERTION N/A 10/12/2020   Procedure: IVC FILTER INSERTION;  Surgeon: Eliza Lonni RAMAN, MD;  Location: [facility] INVASIVE CV LAB;  Service: Cardiovascular;  Laterality: N/A;   OPEN REDUCTION INTERNAL FIXATION (ORIF) DISTAL RADIAL FRACTURE Left 08/25/2022   Procedure: OPEN REDUCTION INTERNAL FIXATION (ORIF) DISTAL RADIUS FRACTURE of nascent malunion;   Surgeon: Shari Easter, MD;  Location: [facility] OR;  Service: Orthopedics;  Laterality: Left;  regional with iv sedation   OPEN REDUCTION INTERNAL FIXATION (ORIF) DISTAL RADIAL FRACTURE Left 06/17/2023   Procedure: OPEN REDUCTION INTERNAL FIXATION (ORIF) DISTAL RADIUS FRACTURE;  Surgeon: Shari Easter, MD;  Location: [facility] OR;  Service: Orthopedics;  Laterality: Left;   TUBAL LIGATION  04/04/1989     ALLERGIES:  Allergies  Allergen Reactions   Zonegran [Zonisamide] Other (See Comments)    Numbness and tingling over whole body   Chocolate Other (See Comments)    Triggers seizures   Codeine Nausea And Vomiting   Keppra  [Levetiracetam ] Other (See Comments)    Causes seizures   Lyrica  [Pregabalin ] Nausea And Vomiting   Olive Oil Rash     CURRENT MEDICATIONS:  Outpatient Encounter Medications as of 03/21/2024  Medication Sig   acetaminophen  (TYLENOL ) 325 MG tablet Take 2 tablets (650 mg total) by mouth every 4 (four) hours as needed for mild pain (pain score 1-3). (Patient taking differently: Take 650 mg by mouth daily as needed for mild pain (pain score 1-3), moderate pain (pain score 4-6) or fever.)   alendronate  (FOSAMAX ) 70 MG tablet Take 1 tablet (70 mg total) by mouth once a week. Take with a full glass of water on an empty stomach.   anastrozole  (ARIMIDEX ) 1 MG tablet Take 1 tablet (1 mg total) by mouth daily.   DILANTIN  100 MG ER capsule Take 1 capsule twice a day (take with 30mg  for total of 130mg  twice a day)   DILANTIN  30 MG ER capsule Take 1 capsule twice a day (take with 100mg  for total of 130mg  twice a day)   folic acid  (FOLVITE ) 1 MG tablet Take 1 tablet (1 mg total) by mouth daily.   LORazepam  (ATIVAN ) 1 MG tablet TAKE 1 TABLET BY MOUTH AS DIRECTED AS NEEDED FOR 3 OR MORE SEIZURES IN 24 HOURS. DO NOT TAKE MORE THAN 2 TABLETS IN 24 HOURS. Strength: 1 mg   VIMPAT  200 MG TABS tablet TAKE 1 TABLET(200 MG) BY MOUTH TWICE DAILY   warfarin (COUMADIN ) 7.5 MG tablet Take 7.5 mg by mouth  every evening.   enoxaparin  (LOVENOX ) 60 MG/0.6ML injection inject 60mg  Injection twice a day; Duration: 10 days (Patient not taking: Reported on 03/21/2024)   Na Sulfate-K Sulfate-Mg Sulfate concentrate (SUPREP) 17.5-3.13-1.6 GM/177ML SOLN Take as directed (Patient not taking: Reported on 03/21/2024)   No facility-administered encounter medications on file as of 03/21/2024.     ONCOLOGIC FAMILY HISTORY:  Family History  Problem Relation Age of Onset   Colon cancer Mother        started as vaginal cancer   Heart attack Mother        4 MIs, started in her 4s, also vaginal cancer and colon cancer   Cancer Mother    Heart attack Father        Died suddenly age 80 - autopsy  revealed heart attack her patient   Cancer Brother        Sinus cancer   Cervical cancer Maternal Aunt    Rectal cancer Neg Hx    Stomach cancer Neg Hx    Esophageal cancer Neg Hx      SOCIAL HISTORY:  Social History   Socioeconomic History   Marital status: Divorced    Spouse name: Not on file   Number of children: 3   Years of education: Not on file   Highest education level: Not on file  Occupational History   Not on file  Tobacco Use   Smoking status: Former    Current packs/day: 0.00    Average packs/day: 0.5 packs/day for 20.0 years (10.0 ttl pk-yrs)    Types: Cigarettes    Start date: 10/26/1997    Quit date: 10/26/2017    Years since quitting: 6.4   Smokeless tobacco: Never   Tobacco comments:    Previously smoked 2 ppd, down to 4 cigs/day, patient quit smoking in 2019  Vaping Use   Vaping status: Never Used  Substance and Sexual Activity   Alcohol use: No   Drug use: No   Sexual activity: Never    Birth control/protection: Post-menopausal  Other Topics Concern   Not on file  Social History Narrative   Pt lives in 2 story home with her daughter, daughter's family and pt's mother-in-law   Has 2 living children / 1 deceased   03-20-2024 grade education   Worked for [employer] until 1998 when she  became disabled.    Right handed   Social Drivers of Health   Financial Resource Strain: Low Risk  (12/31/2022)   Overall Financial Resource Strain (CARDIA)    Difficulty of Paying Living Expenses: Not very hard  Food Insecurity: No Food Insecurity (02/16/2024)   Hunger Vital Sign    Worried About Running Out of Food in the Last Year: Never true    Ran Out of Food in the Last Year: Never true  Transportation Needs: No Transportation Needs (02/16/2024)   PRAPARE - Administrator, Civil Service (Medical): No    Lack of Transportation (Non-Medical): No  Physical Activity: Not on file  Stress: Not on file  Social Connections: Moderately Integrated (02/16/2024)   Social Connection and Isolation Panel    Frequency of Communication with Friends and Family: More than three times a week    Frequency of Social Gatherings with Friends and Family: Three times a week    Attends Religious Services: 1 to 4 times per year    Active Member of Clubs or Organizations: Yes    Attends Banker Meetings: 1 to 4 times per year    Marital Status: Widowed  Intimate Partner Violence: Not At Risk (02/16/2024)   Humiliation, Afraid, Rape, and Kick questionnaire    Fear of Current or Ex-Partner: No    Emotionally Abused: No    Physically Abused: No    Sexually Abused: No     OBSERVATIONS/OBJECTIVE:  BP (!) 129/50 (BP Location: Left Arm)   Pulse 69   Temp 98.5 F (36.9 C) (Oral)   Resp 14   Wt 127 lb 12.8 oz (58 kg)   SpO2 94%   BMI 20.02 kg/m  GENERAL: Patient is a well appearing female in no acute distress Bilateral breasts inspected. No palpable masses. No regional adenopathy No LE edema.   LABORATORY DATA:  None for this visit.  DIAGNOSTIC IMAGING:  None for  this visit.    ASSESSMENT AND PLAN:  [name] is a pleasant 68 y.o. female with Stage IA left breast invasive ductal carcinoma, ER+/PR-/HER2-, diagnosed in 12/2022, treated with lumpectomy, adjuvant radiation  therapy, and anti-estrogen therapy with Anastrozole  beginning in 06/2023.    Assessment and Plan Assessment & Plan Breast cancer on anastrozole  therapy Breast cancer well-managed with anastrozole . No side effects. Recent mammogram normal. - No concern for recurrence on breast exam. - Continue anastrozole  therapy. - Order mammogram for next year.  Osteoporosis, high fracture risk, off bisphosphonate, restarting therapy Severe osteoporosis with high fracture risk. Anastrozole  may worsen osteoporosis. Discussed resuming treatment to improve bone density. Explained Fosamax  use and dental side effects including osteonecrosis, pill esophagitis. - Send new prescription for Fosamax  to [facility]. - Advise to take Fosamax  once a week, sitting upright for 30 minutes post-ingestion with a full glass of water. - Advise to report any new dental problems or jaw problems while on Fosamax . - Recommend taking a multivitamin with calcium . - She is working with PT to get her balance again.  Amber Stalls MD   The patient was provided an opportunity to ask questions and all were answered. The patient agreed with the plan and demonstrated an understanding of the instructions.   Total encounter time:30 minutes*in face-to-face visit time, chart review, lab review, care coordination, order entry, and documentation of the encounter time.    *Total Encounter Time as defined by the Centers for Medicare and Medicaid Services includes, in addition to the face-to-face time of a patient visit (documented in the note above) non-face-to-face time: obtaining and reviewing outside history, ordering and reviewing medications, tests or procedures, care coordination (communications with other health care professionals or caregivers) and documentation in the medical record.

## 2024-03-21 NOTE — Telephone Encounter (Signed)
 Pt daughter called and need to have the Vimpat  sent to the  out patient in [address].  She said that she had asked for it to be sent [facility] but [facility] that [facility] would be 72 hours to get it in the system and then several days to get it to her. She will be out of medication in 3 days.   She had spoke with [facility] and they have the medication so please call it to them to avoid her running out

## 2024-03-22 ENCOUNTER — Other Ambulatory Visit (HOSPITAL_COMMUNITY): Payer: Self-pay

## 2024-03-25 DIAGNOSIS — Z7901 Long term (current) use of anticoagulants: Secondary | ICD-10-CM | POA: Diagnosis not present

## 2024-04-01 DIAGNOSIS — Z7901 Long term (current) use of anticoagulants: Secondary | ICD-10-CM | POA: Diagnosis not present

## 2024-04-04 LAB — ACID FAST CULTURE WITH REFLEXED SENSITIVITIES (MYCOBACTERIA): Acid Fast Culture: NEGATIVE

## 2024-04-05 DIAGNOSIS — D696 Thrombocytopenia, unspecified: Secondary | ICD-10-CM | POA: Diagnosis not present

## 2024-04-05 DIAGNOSIS — I480 Paroxysmal atrial fibrillation: Secondary | ICD-10-CM | POA: Diagnosis not present

## 2024-04-05 DIAGNOSIS — I951 Orthostatic hypotension: Secondary | ICD-10-CM | POA: Diagnosis not present

## 2024-04-05 DIAGNOSIS — C50912 Malignant neoplasm of unspecified site of left female breast: Secondary | ICD-10-CM | POA: Diagnosis not present

## 2024-04-05 DIAGNOSIS — J4489 Other specified chronic obstructive pulmonary disease: Secondary | ICD-10-CM | POA: Diagnosis not present

## 2024-04-05 DIAGNOSIS — G8929 Other chronic pain: Secondary | ICD-10-CM | POA: Diagnosis not present

## 2024-04-05 DIAGNOSIS — G40909 Epilepsy, unspecified, not intractable, without status epilepticus: Secondary | ICD-10-CM | POA: Diagnosis not present

## 2024-04-05 DIAGNOSIS — M519 Unspecified thoracic, thoracolumbar and lumbosacral intervertebral disc disorder: Secondary | ICD-10-CM | POA: Diagnosis not present

## 2024-04-05 DIAGNOSIS — G629 Polyneuropathy, unspecified: Secondary | ICD-10-CM | POA: Diagnosis not present

## 2024-04-05 DIAGNOSIS — M549 Dorsalgia, unspecified: Secondary | ICD-10-CM | POA: Diagnosis not present

## 2024-04-08 DIAGNOSIS — Z7901 Long term (current) use of anticoagulants: Secondary | ICD-10-CM | POA: Diagnosis not present

## 2024-04-11 DIAGNOSIS — M519 Unspecified thoracic, thoracolumbar and lumbosacral intervertebral disc disorder: Secondary | ICD-10-CM | POA: Diagnosis not present

## 2024-04-11 DIAGNOSIS — M549 Dorsalgia, unspecified: Secondary | ICD-10-CM | POA: Diagnosis not present

## 2024-04-11 DIAGNOSIS — G629 Polyneuropathy, unspecified: Secondary | ICD-10-CM | POA: Diagnosis not present

## 2024-04-11 DIAGNOSIS — C50912 Malignant neoplasm of unspecified site of left female breast: Secondary | ICD-10-CM | POA: Diagnosis not present

## 2024-04-11 DIAGNOSIS — G8929 Other chronic pain: Secondary | ICD-10-CM | POA: Diagnosis not present

## 2024-04-11 DIAGNOSIS — D696 Thrombocytopenia, unspecified: Secondary | ICD-10-CM | POA: Diagnosis not present

## 2024-04-11 DIAGNOSIS — G40909 Epilepsy, unspecified, not intractable, without status epilepticus: Secondary | ICD-10-CM | POA: Diagnosis not present

## 2024-04-11 DIAGNOSIS — I480 Paroxysmal atrial fibrillation: Secondary | ICD-10-CM | POA: Diagnosis not present

## 2024-04-11 DIAGNOSIS — J4489 Other specified chronic obstructive pulmonary disease: Secondary | ICD-10-CM | POA: Diagnosis not present

## 2024-04-15 ENCOUNTER — Ambulatory Visit: Admitting: Neurology

## 2024-04-15 ENCOUNTER — Other Ambulatory Visit (HOSPITAL_COMMUNITY): Payer: Self-pay

## 2024-04-15 ENCOUNTER — Other Ambulatory Visit

## 2024-04-15 ENCOUNTER — Encounter: Payer: Self-pay | Admitting: Neurology

## 2024-04-15 ENCOUNTER — Other Ambulatory Visit: Payer: Self-pay | Admitting: Neurology

## 2024-04-15 VITALS — BP 128/64 | HR 93 | Ht 67.0 in | Wt 131.0 lb

## 2024-04-15 DIAGNOSIS — G40219 Localization-related (focal) (partial) symptomatic epilepsy and epileptic syndromes with complex partial seizures, intractable, without status epilepticus: Secondary | ICD-10-CM | POA: Diagnosis not present

## 2024-04-15 MED ORDER — DILANTIN 30 MG PO CAPS: ORAL_CAPSULE | ORAL | 3 refills | Status: DC

## 2024-04-15 MED ORDER — DILANTIN 100 MG PO CAPS: ORAL_CAPSULE | ORAL | 3 refills | Status: DC

## 2024-04-15 MED ORDER — VIMPAT 200 MG PO TABS
200.0000 mg | ORAL_TABLET | Freq: Two times a day (BID) | ORAL | 5 refills | Status: DC
  Filled 2024-04-15: qty 60, 30d supply, fill #0

## 2024-04-15 NOTE — Patient Instructions (Signed)
 Good to see you.  Have Dilantin  level done around 4pm   2. Continue Dilantin  130mg  twice a day (100mg +30mg  twice a day), Vimpat  200mg  twice a day  3. Please contact our office to inform us  when you have a seizure  4. Follow-up in 4-5 months, call for any changes   Seizure Precautions: 1. If medication has been prescribed for you to prevent seizures, take it exactly as directed.  Do not stop taking the medicine without talking to your doctor first, even if you have not had a seizure in a long time.   2. Avoid activities in which a seizure would cause danger to yourself or to others.  Don't operate dangerous machinery, swim alone, or climb in high or dangerous places, such as on ladders, roofs, or girders.  Do not drive unless your doctor says you may.  3. If you have any warning that you may have a seizure, lay down in a safe place where you can't hurt yourself.    4.  No driving for 6 months from last seizure, as per [place]  state law.   Please refer to the following link on the [employer]ile[employer] of [employer]'s website for more information: http://www.epilepsyfoundation.org/answerplace/Social/driving/drivingu.cfm   5.  Maintain good sleep hygiene. Avoid alcohol.  6.  Contact your doctor if you have any problems that may be related to the medicine you are taking.  7.  Call 911 and bring the patient back to the ED if:        A.  The seizure lasts longer than 5 minutes.       B.  The patient doesn't awaken shortly after the seizure  C.  The patient has new problems such as difficulty seeing, speaking or moving  D.  The patient was injured during the seizure  E.  The patient has a temperature over 102 F (39C)  F.  The patient vomited and now is having trouble breathing

## 2024-04-15 NOTE — Progress Notes (Unsigned)
 NEUROLOGY FOLLOW UP OFFICE NOTE  [name] [id number] 1955-09-13  HISTORY OF PRESENT ILLNESS: I had the pleasure of seeing [name] in follow-up in the neurology clinic on 04/14/2024.  The patient was last seen 4 months ago 4 months ago for intractable epilepsy and neuropathy. Her daughter [name] walked out of the room due to patient's lack of insight into her condition. Patient denies any seizures however [name] has a video of the last seizure on 03/31/24. on *** and is accompanied by today.  Records and images were personally reviewed where available.  ***. 200mg  BID Dilantin  130 bid    8/28 has video of sz 7/14: and GERD presents with altered mental status and possible seizure activity. She is accompanied by her son. Does not remember feeling confused  89 F  respirations elevated to 27, blood pressure 84/47 -119/48, and O2 saturations maintained on room air.  Labs significant for WBC 5.8, platelets 112, lactic acid 2.3-> 0.5, sodium 134, potassium 3.1, INR 5.4, CRP 25.8, and phenytoin  level 8.3.  Chest x-ray showed no acute abnormality.  Influenza, COVID-19, and RSV screening were negative  Seizure threshold probably lowered due to sepsis physiology-and missing several doses of AEDs (out of Vimpat  for a week-and just restarted several days prior to this hospitalization). 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 Her family noted a significant change in her condition from yesterday to today. She has experienced similar  episodes in the past, often associated with urinary tract infections (UTIs), although it is unclear if she had a UTI this time.   I had the pleasure of seeing [name] in follow-up in the neurology clinic on 12/14/2023.  The patient was last seen 8 months ago for intractable epilepsy and neuropathy. She is again accompanied by her daughter [name] who helps supplement the history today.  Records and images were personally reviewed where available. She is agitated in the office today, upset about her independence and inability to drive. Since her last visit, our office was contacted about a seizure on 08/21/23 due to missing medication the night prior and being anxious about her MRI. She had a fall from the seizure. [name] reports she had 3 back to back, this was the last time prn Ativan  was given. She is angry that [name] is reporting seizures in April, adamant she has not had any further seizures in almost 6 months despite video from April showing a focal impaired awareness seizure that secondarily generalized. Video reviewed, she is sitting on the bed then bends forward in a sitting position, then she lays back on the bed with arms flexed to chest followed by tonic extension of left arm. Her mother-in-law witnessed the seizure and was calling her with no response. The seizure lasted around 45 seconds. [name] believes a lot of the seizures are due to missing doses. There appears to be a change in the dynamic at home, [name] had been managing medications however today reports that the patient has been doing her own medications. The patient self-increased the Dilantin  to 130mg  BID and states that since  then, she has been feeling fine. She denies any dizziness. In the past, she has had multiple hospital admissions for supratherapeutic Dilantin  levels so we had agreed to stay on 100mg  in AM, 130mg  in PM previously. She is also on Vimpat  200mg  BID without side effects. She reports sleeping well. She is on Coumadin  with  INR of 1.7 yesterday, they are going back on Thursday to recheck INR because it is unclear if subtherapeutic INR is due to missed dose or needs adjustment. She reports stopping the Lexapro  because it knocked her out. She reports her legs and back hurt her, she takes Tylenol  as needed every couple of days.  She has some back pain that they attribute to laying on a flat board from radiation. She will be completing radiation this week. She reports that the supplements Neuroshield (B vitamins) help her neuropathy. She has not tolerated neuropathic medication trials in the past (Gabapentin , Lyrica ). She is on Coumadin  for DVT.    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 She had been seeing epileptologist Dr. Darrol, records were reviewed and will be summarized as follows. In the past, seizures would start with headache and burning sensation of the left arm, which may be followed by left arm jerking. Over time, she has not had any prior warning symptoms. She has had seizures where she is noted to have kicking and rocking movements of the pelvis with unresponsiveness. She was in the EMU in 10/2012 where 11 seizures were captured from the right posterior hemisphere/right inferior temporal region. Interictal EEG showed abundant right posterior temporal epileptiform discharges, frequent right anterior temporal epileptiform discharges, as well as occasional independent left anterior temporal epileptiform discharges. There was focal slowing over the right posterior hemisphere. MRI brain in 2014 showed right parietal/occipital encephalomalacia, right hippocampus smaller than left. PET scan in 2015 showed multiple areas of hypometabolism on the right, mostly corresponding to or adjacent to  the structural abnormality. Ictal SPECT showed right hemisphere hypoperfusion. SISCOM showed right frontal region significant on the subtraction. MEG in 07/2013 showed spikes localizing to the right occipital, parietal, mesial temporal regions with a weak signal left mesial parietal. Concomitant EEG showed right posterior spikes. Functional mapping suggestive of left language lateralization. She has tried multiple AEDs in the past with various side effects or ineffective, and has been taking Dilantin  160mg  in AM, 200mg  in PM and Vimpat  200mg  BID with seizures around once a month. She was admitted to [facility] on 03/19/18 for left leg weakness and drowsiness. She was fishing with her boyfriend when she suddenly could not move her left leg and almost fell. She states that her boyfriend told her that she was shaking and had a seizure while they were fishing. She was brought to [facility] where Dilantin  level was 33.8. She was adamant that she has been taking her medications as instructed and did not take more than prescribed, no new medications taken. She had an MRI brain without contrast which I personally reviewed, no acute changes seen. Large volume right parieto-occipital encephalomalacia was unchanged. Right hippocampus is smaller. She continued to report weakness and had an MRI cervical and lumbar spine which did not show any myelopathy, there was prominent left-sided facet hypertrophy at C4-5 with resultant moderate left C5 foraminal stenosis, degenerative disc osteophyte at C5-6 with severe  right C6 foraminal stenosis, and mild spinal stenosis at C5-6 and C6-7, chronic borderline to mild multifactorial spinal stenosis at L2-3 through L4-5 with no convincing lateral recess or foraminal stenosis. She continued to report burning pain in her legs and was started on low dose gabapentin  300mg  qhs with no side effects. Dilantin  was held in the hospital, she was discharged on a lower dose, but states that she has been taking her  pre-hospital dose of 160mg  in AM, 200mg  in PM but has had 4 seizures since hospital discharge 2 weeks ago, which is unusual. She had 2 seizures in one day in the car with her mother-in-law, her mother-in-law had to pull over. The next day she had 2 at home, her daughter found her in the bathroom. She has not been sleeping well the past few days. She has a headache after the seizures. Aside from the convulsions, she has episodes where a feeling comes over her left side, from the arm down her leg, then she is weaker after. She denies any jerking. She is quite anxious in the office today and perseverates on her symptoms. She states she stays nervous. She reports taking prn Ativan  in the past, but discontinued at [facility]. She repeatedly states her feet are numb and they feel like they are grabbed. She feels gabapentin  has helped some with this.    Prior AEDs: phenobarbital (ineffective, mood issues), Depakote  (multiple trials, ineffective), Keppra  (ineffective), Zonisamide (side effects on brief trial of higher dose than prescribed - 500mg  instead of 200mg ), Onfi  (mouth sores), Tegretol (ineffective), Lamictal (nausea, dizziness, malaise), Felbatol (nausea, dizziness, malaise), Topamax  (ineffective), Tigabine (tried per notes, details unclear), Neurontin  (ineffective, nausea), Lyrica  (leg pain reported on 25mg  daily, tingling), Briviact  (drowsiness), Fycompa , Cenobamate    Epilepsy Risk Factors:  Right parieto-occipital AVM s/p rupture and hemorrhage in 1988, s/p craniectomy. Otherwise she had a normal birth and early development.  There is no history of febrile convulsions, CNS infections such as meningitis/encephalitis, or family history of seizures.  Diagnostic Data:  MRI brain without contrast 05/2018 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  EEG done 06/2018 showed right posterior temporal slowing, occasional  epileptiform discharges over the right posterior temporal region, breach artifact in this region.   MRI cervical, thoracic, and lumbar spine done 07/2018 showed no convincing cervical or thoracic spinal cord abnormality. Mild degenerative cervical spinal stenosis at C5-6 and C6-7 with moderate or severe bilateral foraminal stenosis; no thoracic spinal or foraminal stenosis; stable borderline to mild lumbar spinal stenosis  MRI lumbar spine in 2019 showed borderline to mild multifactorial spinal stenosis at L2-L3 and L3-L4 related to circumferential disc bulge and posterior element Hypertrophy.  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    PAST MEDICAL HISTORY: Past Medical History:  Diagnosis Date   Acute bronchitis 08/06/2022   Acute respiratory failure with hypoxia (HCC) 03/31/2015   Altered mental status    Asthma    Ataxia 08/26/2019   Atypical chest pain 02/17/2017   Cerebral hemorrhage (HCC) 1989   Chronic back pain    Closed fracture of distal end of left radius 07/17/2022   Closed fracture of fifth metatarsal bone 01/04/2019   Closed fracture of proximal phalanx of great toe 01/04/2019   Closed nondisplaced fracture of styloid process of left radius 08/06/2022   COPD (chronic obstructive pulmonary disease) (HCC)    no meds per  daughter   COPD with exacerbation (HCC) 03/30/2015   Costochondritis 02/17/2017   COVID-19 virus infection 04/02/2020   DDD (degenerative disc disease) 06/12/2013   DVT (deep venous thrombosis) (HCC)    she's had several since 1990   GERD (gastroesophageal reflux disease)    no meds per daughter   Headache    usually around time when she's had a seizure   History of blood transfusion    when she was a baby   History of kidney stones    History of stomach ulcers    Hypoxia 01/18/2015   Lactic acidosis 04/02/2020   Neuropathy    Seizures (HCC)     because of her brain surgery  last one 07/28/22   Sepsis secondary to UTI (HCC) 01/17/2015   Stroke [facility]) 1990's   family denies residual on 11/09/2014   Tunnel vision    since brain OR    MEDICATIONS: Current Outpatient Medications on File Prior to Visit  Medication Sig Dispense Refill   acetaminophen  (TYLENOL ) 325 MG tablet Take 2 tablets (650 mg total) by mouth every 4 (four) hours as needed for mild pain (pain score 1-3).     alendronate  (FOSAMAX ) 70 MG tablet Take 1 tablet (70 mg total) by mouth once a week. Take with a full glass of water on an empty stomach. 4 tablet 3   anastrozole  (ARIMIDEX ) 1 MG tablet Take 1 tablet (1 mg total) by mouth daily. 90 tablet 3   DILANTIN  100 MG ER capsule Take 1 capsule twice a day (take with 30mg  for total of 130mg  twice a day) 180 capsule 3   DILANTIN  30 MG ER capsule Take 1 capsule twice a day (take with 100mg  for total of 130mg  twice a day) 100 capsule 3   folic acid  (FOLVITE ) 1 MG tablet Take 1 tablet (1 mg total) by mouth daily. 90 tablet 3   LORazepam  (ATIVAN ) 1 MG tablet TAKE 1 TABLET BY MOUTH AS DIRECTED AS NEEDED FOR 3 OR MORE SEIZURES IN 24 HOURS. DO NOT TAKE MORE THAN 2 TABLETS IN 24 HOURS. Strength: 1 mg 10 tablet 5   VIMPAT  200 MG TABS tablet Take 1 tablet (200 mg total) by mouth 2 (two) times daily. 60 tablet 5   warfarin (COUMADIN ) 7.5 MG tablet Take 7.5 mg by mouth every evening.     enoxaparin  (LOVENOX ) 60 MG/0.6ML injection inject 60mg  Injection twice a day; Duration: 10 days (Patient not taking: Reported on 03/21/2024)     Na Sulfate-K Sulfate-Mg Sulfate concentrate (SUPREP) 17.5-3.13-1.6 GM/177ML SOLN Take as directed (Patient not taking: Reported on 03/21/2024) 354 mL 0   No current facility-administered medications on file prior to visit.    ALLERGIES: Allergies  Allergen Reactions   Zonegran [Zonisamide] Other (See Comments)    Numbness and tingling over whole body   Chocolate Other (See Comments)    Triggers seizures    Codeine Nausea And Vomiting   Keppra  [Levetiracetam ] Other (See Comments)    Causes seizures   Lyrica  [Pregabalin ] Nausea And Vomiting   Olive Oil Rash    FAMILY HISTORY: Family History  Problem Relation Age of Onset   Colon cancer Mother        started as vaginal cancer   Heart attack Mother        4 MIs, started in her 18s, also vaginal cancer and colon cancer   Cancer Mother    Heart attack Father        Died suddenly age 66 -  autopsy revealed heart attack her patient   Cancer Brother        Sinus cancer   Cervical cancer Maternal Aunt    Rectal cancer Neg Hx    Stomach cancer Neg Hx    Esophageal cancer Neg Hx     SOCIAL HISTORY: Social History   Socioeconomic History   Marital status: Divorced    Spouse name: Not on file   Number of children: 3   Years of education: Not on file   Highest education level: Not on file  Occupational History   Not on file  Tobacco Use   Smoking status: Former    Current packs/day: 0.00    Average packs/day: 0.5 packs/day for 20.0 years (10.0 ttl pk-yrs)    Types: Cigarettes    Start date: 10/26/1997    Quit date: 10/26/2017    Years since quitting: 6.4   Smokeless tobacco: Never   Tobacco comments:    Previously smoked 2 ppd, down to 4 cigs/day, patient quit smoking in 2019  Vaping Use   Vaping status: Never Used  Substance and Sexual Activity   Alcohol use: No   Drug use: No   Sexual activity: Never    Birth control/protection: Post-menopausal  Other Topics Concern   Not on file  Social History Narrative   Pt lives in 2 story home with her daughter, daughter's family and pt's mother-in-law   Has 2 living children / 1 deceased   May 10, 2024 grade education   Worked for [employer] until 1998 when she became disabled.    Right handed   Social Drivers of Health   Financial Resource Strain: Low Risk  (12/31/2022)   Overall Financial Resource Strain (CARDIA)    Difficulty of Paying Living Expenses: Not very hard  Food Insecurity:  No Food Insecurity (02/16/2024)   Hunger Vital Sign    Worried About Running Out of Food in the Last Year: Never true    Ran Out of Food in the Last Year: Never true  Transportation Needs: No Transportation Needs (02/16/2024)   PRAPARE - Administrator, Civil Service (Medical): No    Lack of Transportation (Non-Medical): No  Physical Activity: Not on file  Stress: Not on file  Social Connections: Moderately Integrated (02/16/2024)   Social Connection and Isolation Panel    Frequency of Communication with Friends and Family: More than three times a week    Frequency of Social Gatherings with Friends and Family: Three times a week    Attends Religious Services: 1 to 4 times per year    Active Member of Clubs or Organizations: Yes    Attends Banker Meetings: 1 to 4 times per year    Marital Status: Widowed  Intimate Partner Violence: Not At Risk (02/16/2024)   Humiliation, Afraid, Rape, and Kick questionnaire    Fear of Current or Ex-Partner: No    Emotionally Abused: No    Physically Abused: No    Sexually Abused: No     PHYSICAL EXAM: Vitals:   2024-05-10 1102  BP: 128/64  Pulse: 93  SpO2: 97%   General: No acute distress Head:  Normocephalic/atraumatic Skin/Extremities: No rash, no edema Neurological Exam: alert and oriented to person, place, and time. No aphasia or dysarthria. Fund of knowledge is appropriate.  Recent and remote memory are intact.  Attention and concentration are normal.   Cranial nerves: Pupils equal, round. Extraocular movements intact with no nystagmus. Visual fields full.  No facial asymmetry.  Motor: Bulk and tone normal, muscle strength 5/5 throughout with no pronator drift.   Finger to nose testing intact.  Gait narrow-based and steady, able to tandem walk adequately.  Romberg negative.   IMPRESSION: ***   Thank you for allowing me to participate in *** care.  Please do not hesitate to call for any questions or concerns.  The  duration of this appointment visit was *** minutes of face-to-face time with the patient.  Greater than 50% of this time was spent in counseling, explanation of diagnosis, planning of further management, and coordination of care.   Darice Shivers, M.D.   CC: ***      PAST MEDICAL HISTORY: Past Medical History:  Diagnosis Date   Acute bronchitis 08/06/2022   Acute respiratory failure with hypoxia (HCC) 03/31/2015   Altered mental status    Asthma    Ataxia 08/26/2019   Atypical chest pain 02/17/2017   Cerebral hemorrhage (HCC) 1989   Chronic back pain    Closed fracture of distal end of left radius 07/17/2022   Closed fracture of fifth metatarsal bone 01/04/2019   Closed fracture of proximal phalanx of great toe 01/04/2019   Closed nondisplaced fracture of styloid process of left radius 08/06/2022   COPD (chronic obstructive pulmonary disease) (HCC)    no meds per daughter   COPD with exacerbation (HCC) 03/30/2015   Costochondritis 02/17/2017   COVID-19 virus infection 04/02/2020   DDD (degenerative disc disease) 06/12/2013   DVT (deep venous thrombosis) (HCC)    she's had several since 1990   GERD (gastroesophageal reflux disease)    no meds per daughter   Headache    usually around time when she's had a seizure   History of blood transfusion    when she was a baby   History of kidney stones    History of stomach ulcers    Hypoxia 01/18/2015   Lactic acidosis 04/02/2020   Neuropathy    Seizures (HCC)    because of her brain surgery  last one 07/28/22   Sepsis secondary to UTI (HCC) 01/17/2015   Stroke [facility]) 1990's   family denies residual on 11/09/2014   Tunnel vision    since brain OR    MEDICATIONS: Current Outpatient Medications on File Prior to Visit  Medication Sig Dispense Refill   acetaminophen  (TYLENOL ) 325 MG tablet Take 2 tablets (650 mg total) by mouth every 4 (four) hours as needed for mild pain (pain score 1-3).     alendronate  (FOSAMAX )  70 MG tablet Take 1 tablet (70 mg total) by mouth once a week. Take with a full glass of water on an empty stomach. 4 tablet 3   anastrozole  (ARIMIDEX ) 1 MG tablet Take 1 tablet (1 mg total) by mouth daily. 90 tablet 3   DILANTIN  100 MG ER capsule Take 1 capsule twice a day (take with 30mg  for total of 130mg  twice a day) 180 capsule 3   DILANTIN  30 MG ER capsule Take 1 capsule twice a day (take with 100mg  for total of 130mg  twice a day) 100 capsule 3   folic acid  (FOLVITE ) 1 MG tablet Take 1 tablet (1 mg total) by mouth daily. 90 tablet 3   LORazepam  (ATIVAN ) 1 MG tablet TAKE 1 TABLET BY MOUTH AS DIRECTED AS NEEDED FOR 3 OR MORE SEIZURES IN 24 HOURS. DO NOT TAKE MORE THAN 2 TABLETS IN 24 HOURS. Strength: 1 mg 10 tablet 5   VIMPAT  200 MG TABS tablet Take 1 tablet (  200 mg total) by mouth 2 (two) times daily. 60 tablet 5   warfarin (COUMADIN ) 7.5 MG tablet Take 7.5 mg by mouth every evening.     enoxaparin  (LOVENOX ) 60 MG/0.6ML injection inject 60mg  Injection twice a day; Duration: 10 days (Patient not taking: Reported on 03/21/2024)     Na Sulfate-K Sulfate-Mg Sulfate concentrate (SUPREP) 17.5-3.13-1.6 GM/177ML SOLN Take as directed (Patient not taking: Reported on 03/21/2024) 354 mL 0   No current facility-administered medications on file prior to visit.    ALLERGIES: Allergies  Allergen Reactions   Zonegran [Zonisamide] Other (See Comments)    Numbness and tingling over whole body   Chocolate Other (See Comments)    Triggers seizures   Codeine Nausea And Vomiting   Keppra  [Levetiracetam ] Other (See Comments)    Causes seizures   Lyrica  [Pregabalin ] Nausea And Vomiting   Olive Oil Rash    FAMILY HISTORY: Family History  Problem Relation Age of Onset   Colon cancer Mother        started as vaginal cancer   Heart attack Mother        4 MIs, started in her 37s, also vaginal cancer and colon cancer   Cancer Mother    Heart attack Father        Died suddenly age 69 - autopsy revealed  heart attack her patient   Cancer Brother        Sinus cancer   Cervical cancer Maternal Aunt    Rectal cancer Neg Hx    Stomach cancer Neg Hx    Esophageal cancer Neg Hx     SOCIAL HISTORY: Social History   Socioeconomic History   Marital status: Divorced    Spouse name: Not on file   Number of children: 3   Years of education: Not on file   Highest education level: Not on file  Occupational History   Not on file  Tobacco Use   Smoking status: Former    Current packs/day: 0.00    Average packs/day: 0.5 packs/day for 20.0 years (10.0 ttl pk-yrs)    Types: Cigarettes    Start date: 10/26/1997    Quit date: 10/26/2017    Years since quitting: 6.4   Smokeless tobacco: Never   Tobacco comments:    Previously smoked 2 ppd, down to 4 cigs/day, patient quit smoking in 2019  Vaping Use   Vaping status: Never Used  Substance and Sexual Activity   Alcohol use: No   Drug use: No   Sexual activity: Never    Birth control/protection: Post-menopausal  Other Topics Concern   Not on file  Social History Narrative   Pt lives in 2 story home with her daughter, daughter's family and pt's mother-in-law   Has 2 living children / 1 deceased   05/04/24 grade education   Worked for [employer] until 1998 when she became disabled.    Right handed   Social Drivers of Health   Financial Resource Strain: Low Risk  (12/31/2022)   Overall Financial Resource Strain (CARDIA)    Difficulty of Paying Living Expenses: Not very hard  Food Insecurity: No Food Insecurity (02/16/2024)   Hunger Vital Sign    Worried About Running Out of Food in the Last Year: Never true    Ran Out of Food in the Last Year: Never true  Transportation Needs: No Transportation Needs (02/16/2024)   PRAPARE - Administrator, Civil Service (Medical): No    Lack of Transportation (Non-Medical): No  Physical Activity: Not on file  Stress: Not on file  Social Connections: Moderately Integrated (02/16/2024)   Social  Connection and Isolation Panel    Frequency of Communication with Friends and Family: More than three times a week    Frequency of Social Gatherings with Friends and Family: Three times a week    Attends Religious Services: 1 to 4 times per year    Active Member of Clubs or Organizations: Yes    Attends Banker Meetings: 1 to 4 times per year    Marital Status: Widowed  Intimate Partner Violence: Not At Risk (02/16/2024)   Humiliation, Afraid, Rape, and Kick questionnaire    Fear of Current or Ex-Partner: No    Emotionally Abused: No    Physically Abused: No    Sexually Abused: No     PHYSICAL EXAM: Vitals:   04/15/24 1102  BP: 128/64  Pulse: 93  SpO2: 97%   General: No acute distress, angry and irritable today Head:  Normocephalic/atraumatic, +torticollis with head bent to the right (similar to prior) Skin/Extremities: No rash, no edema Neurological Exam: alert and awake. No aphasia or dysarthria. Fund of knowledge is reduced.  Recent and remote memory are impaired. Attention and concentration are reduced.  Cranial nerves: Pupils equal, round. Extraocular movements intact with no nystagmus. Visual fields full.  No facial asymmetry.  Motor: Bulk and tone normal, muscle strength 5/5 throughout with no pronator drift.   Finger to nose testing intact.  Gait unsteady, favors right leg, no ataxia.    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 She has tried multiple AEDs in the past with various side effects. She has had several admissions for Dilantin  toxicity and has been unable to tolerate higher doses of Dilantin  but today reports she self-increased Dilantin  to 130mg  BID and has been doing well. She has reduced insight/in denial about continued seizures, repeatedly stating she has not had seizures despite video  evidence of seizure from last month. We agreed to continue Dilantin  130mg  BID and Vimpat  200mg  BID, check levels on current regimen. There is concern about medication compliance however she is visibly upset with her daughter and agitated in the office today. She reports stopping Lexapro  and is not interested in starting a different SSRI. Advised to continue pillbox with alarm. She is aware of [place] driving laws. Follow-up in 4 months, call for any changes.   Thank you for allowing me to participate in her care.  Please do not hesitate to call for any questions or concerns.    Darice Shivers, M.D.   CC: Anthony Hint, FNP

## 2024-04-18 LAB — PHENYTOIN LEVEL, FREE AND TOTAL: PHENYTOIN, FREE: 0.6 mg/L — ABNORMAL LOW (ref 1.0–2.0)

## 2024-04-22 DIAGNOSIS — I480 Paroxysmal atrial fibrillation: Secondary | ICD-10-CM | POA: Diagnosis not present

## 2024-04-22 DIAGNOSIS — G8929 Other chronic pain: Secondary | ICD-10-CM | POA: Diagnosis not present

## 2024-04-22 DIAGNOSIS — D696 Thrombocytopenia, unspecified: Secondary | ICD-10-CM | POA: Diagnosis not present

## 2024-04-22 DIAGNOSIS — J4489 Other specified chronic obstructive pulmonary disease: Secondary | ICD-10-CM | POA: Diagnosis not present

## 2024-04-22 DIAGNOSIS — G40909 Epilepsy, unspecified, not intractable, without status epilepticus: Secondary | ICD-10-CM | POA: Diagnosis not present

## 2024-04-22 DIAGNOSIS — I951 Orthostatic hypotension: Secondary | ICD-10-CM | POA: Diagnosis not present

## 2024-04-22 DIAGNOSIS — C50912 Malignant neoplasm of unspecified site of left female breast: Secondary | ICD-10-CM | POA: Diagnosis not present

## 2024-04-22 DIAGNOSIS — G629 Polyneuropathy, unspecified: Secondary | ICD-10-CM | POA: Diagnosis not present

## 2024-04-22 DIAGNOSIS — M519 Unspecified thoracic, thoracolumbar and lumbosacral intervertebral disc disorder: Secondary | ICD-10-CM | POA: Diagnosis not present

## 2024-05-06 DIAGNOSIS — Z7901 Long term (current) use of anticoagulants: Secondary | ICD-10-CM | POA: Diagnosis not present

## 2024-05-09 ENCOUNTER — Other Ambulatory Visit: Payer: Self-pay | Admitting: Neurology

## 2024-05-10 NOTE — Telephone Encounter (Signed)
**Note De-identified  [place] Obfuscation** Please advise 

## 2024-05-13 ENCOUNTER — Other Ambulatory Visit: Payer: Self-pay | Admitting: Neurology

## 2024-05-16 ENCOUNTER — Encounter (HOSPITAL_COMMUNITY): Payer: Self-pay | Admitting: Internal Medicine

## 2024-05-16 ENCOUNTER — Other Ambulatory Visit: Payer: Self-pay

## 2024-05-16 MED ORDER — NA SULFATE-K SULFATE-MG SULF 17.5-3.13-1.6 GM/177ML PO SOLN: ORAL | 0 refills | Status: AC

## 2024-05-16 NOTE — Progress Notes (Signed)
 Pre op call Veva Reynolds    PCP- Dyane FNP Cardiologist-Hilty MD Neuro-Aquino MD  EKG-02/18/24 Echo-02/16/23 Cath-n/a Stress-n/a ICD/PM-n/a GLP1-n/a Blood Thinner-Coumadin - office contacted about holding   History: ICH, Asthma, COPD, Stroke, DVT, IVC filter, Seizures. Patient last saw her heart MD in december last year, per daughter she only needs to see prn. She saw her neuro MD 9/12 and per pt she had been fine, per daughter she has seen seizure activity 03/2024. Per neuro they let the family/pt know if have anymore episodes to let them know. Per daughter she has not had anymore seizure activty since then.  Does use walker to help get around.  Anesthesia Review- Yes- ok to proceed

## 2024-05-19 ENCOUNTER — Telehealth: Payer: Self-pay

## 2024-05-19 NOTE — Telephone Encounter (Signed)
 Procedure:COLON Procedure date: 05/23/24 Procedure location: [facility] Arrival Time: 7:30 Spoke with the patient Y/N: Y Any prep concerns? N  Has the patient obtained the prep from the pharmacy ? Y Do you have a care partner and transportation: Y Any additional concerns? N

## 2024-05-21 NOTE — Anesthesia Preprocedure Evaluation (Signed)
 Anesthesia Evaluation  Patient identified by MRN, date of birth, ID band Patient awake    Reviewed: Allergy & Precautions, NPO status , Patient's Chart, lab work & pertinent test results  History of Anesthesia Complications Negative for: history of anesthetic complications  Airway Mallampati: II  TM Distance: >3 FB Neck ROM: Full    Dental no notable dental hx. (+) Upper Dentures   Pulmonary asthma , COPD, former smoker   Pulmonary exam normal breath sounds clear to auscultation       Cardiovascular (-) angina (-) CAD and (-) Past MI Normal cardiovascular exam Rhythm:Regular Rate:Normal     Neuro/Psych Seizures -,  PSYCHIATRIC DISORDERS Anxiety     Intractable sz on dilantin  CVA (right parietal/occipital AVMs/prupture and hemorrhage in 1988, s/pcraniectomy)    GI/Hepatic ,GERD  ,,  Endo/Other  negative endocrine ROS    Renal/GU negative Renal ROS     Musculoskeletal negative musculoskeletal ROS (+)    Abdominal   Peds  Hematology   Anesthesia Other Findings All: codeine lyrica , keppra   Reproductive/Obstetrics                              Anesthesia Physical Anesthesia Plan  ASA: 3  Anesthesia Plan: MAC   Post-op Pain Management: Minimal or no pain anticipated   Induction: Intravenous  PONV Risk Score and Plan: Propofol  infusion  Airway Management Planned: Natural Airway and Nasal Cannula  Additional Equipment: None  Intra-op Plan:   Post-operative Plan:   Informed Consent: I have reviewed the patients History and Physical, chart, labs and discussed the procedure including the risks, benefits and alternatives for the proposed anesthesia with the patient or authorized representative who has indicated his/her understanding and acceptance.     Dental advisory given  Plan Discussed with:   Anesthesia Plan Comments: (EGD Colon for Anemia)         Anesthesia Quick  Evaluation

## 2024-05-23 ENCOUNTER — Ambulatory Visit (HOSPITAL_COMMUNITY): Payer: Self-pay | Admitting: Anesthesiology

## 2024-05-23 ENCOUNTER — Encounter (HOSPITAL_COMMUNITY): Admission: RE | Payer: Self-pay | Source: Home / Self Care

## 2024-05-23 ENCOUNTER — Ambulatory Visit (HOSPITAL_COMMUNITY)
Admission: RE | Admit: 2024-05-23 | Discharge: 2024-05-23 | Disposition: A | Discharge status: Home / Self Care | Attending: Internal Medicine | Admitting: Internal Medicine

## 2024-05-23 ENCOUNTER — Encounter (HOSPITAL_COMMUNITY)
Admission: RE | Disposition: A | Discharge status: Home / Self Care | Payer: Self-pay | Source: Home / Self Care | Attending: Internal Medicine

## 2024-05-23 ENCOUNTER — Other Ambulatory Visit: Payer: Self-pay

## 2024-05-23 ENCOUNTER — Encounter (HOSPITAL_COMMUNITY): Payer: Self-pay | Admitting: Internal Medicine

## 2024-05-23 DIAGNOSIS — Z8673 Personal history of transient ischemic attack (TIA), and cerebral infarction without residual deficits: Secondary | ICD-10-CM | POA: Diagnosis not present

## 2024-05-23 DIAGNOSIS — D125 Benign neoplasm of sigmoid colon: Secondary | ICD-10-CM | POA: Diagnosis not present

## 2024-05-23 DIAGNOSIS — K317 Polyp of stomach and duodenum: Secondary | ICD-10-CM | POA: Diagnosis not present

## 2024-05-23 DIAGNOSIS — J4489 Other specified chronic obstructive pulmonary disease: Secondary | ICD-10-CM | POA: Diagnosis not present

## 2024-05-23 DIAGNOSIS — Z800 Family history of malignant neoplasm of digestive organs: Secondary | ICD-10-CM

## 2024-05-23 DIAGNOSIS — D509 Iron deficiency anemia, unspecified: Secondary | ICD-10-CM | POA: Diagnosis present

## 2024-05-23 DIAGNOSIS — K5520 Angiodysplasia of colon without hemorrhage: Secondary | ICD-10-CM

## 2024-05-23 DIAGNOSIS — K449 Diaphragmatic hernia without obstruction or gangrene: Secondary | ICD-10-CM | POA: Diagnosis not present

## 2024-05-23 DIAGNOSIS — Z1211 Encounter for screening for malignant neoplasm of colon: Secondary | ICD-10-CM | POA: Diagnosis present

## 2024-05-23 DIAGNOSIS — K5710 Diverticulosis of small intestine without perforation or abscess without bleeding: Secondary | ICD-10-CM | POA: Diagnosis not present

## 2024-05-23 DIAGNOSIS — F419 Anxiety disorder, unspecified: Secondary | ICD-10-CM | POA: Insufficient documentation

## 2024-05-23 DIAGNOSIS — Z87891 Personal history of nicotine dependence: Secondary | ICD-10-CM

## 2024-05-23 DIAGNOSIS — K2950 Unspecified chronic gastritis without bleeding: Secondary | ICD-10-CM | POA: Diagnosis not present

## 2024-05-23 DIAGNOSIS — Z820 Family history of epilepsy and other diseases of the nervous system: Secondary | ICD-10-CM | POA: Insufficient documentation

## 2024-05-23 DIAGNOSIS — K3189 Other diseases of stomach and duodenum: Secondary | ICD-10-CM | POA: Insufficient documentation

## 2024-05-23 DIAGNOSIS — K635 Polyp of colon: Secondary | ICD-10-CM | POA: Diagnosis not present

## 2024-05-23 DIAGNOSIS — K2970 Gastritis, unspecified, without bleeding: Secondary | ICD-10-CM

## 2024-05-23 DIAGNOSIS — D510 Vitamin B12 deficiency anemia due to intrinsic factor deficiency: Secondary | ICD-10-CM | POA: Insufficient documentation

## 2024-05-23 DIAGNOSIS — R569 Unspecified convulsions: Secondary | ICD-10-CM | POA: Diagnosis not present

## 2024-05-23 DIAGNOSIS — D539 Nutritional anemia, unspecified: Secondary | ICD-10-CM | POA: Diagnosis not present

## 2024-05-23 DIAGNOSIS — D123 Benign neoplasm of transverse colon: Secondary | ICD-10-CM

## 2024-05-23 DIAGNOSIS — J449 Chronic obstructive pulmonary disease, unspecified: Secondary | ICD-10-CM

## 2024-05-23 DIAGNOSIS — K219 Gastro-esophageal reflux disease without esophagitis: Secondary | ICD-10-CM | POA: Diagnosis not present

## 2024-05-23 HISTORY — PX: COLONOSCOPY: SHX5424

## 2024-05-23 HISTORY — PX: BIOPSY OF SKIN SUBCUTANEOUS TISSUE AND/OR MUCOUS MEMBRANE: SHX6741

## 2024-05-23 HISTORY — PX: POLYPECTOMY: SHX149

## 2024-05-23 HISTORY — PX: ESOPHAGOGASTRODUODENOSCOPY: SHX5428

## 2024-05-23 SURGERY — COLONOSCOPY
Anesthesia: Monitor Anesthesia Care

## 2024-05-23 MED ORDER — PROPOFOL 500 MG/50ML IV EMUL
INTRAVENOUS | Status: DC | PRN
  Administered 2024-05-23: 125 ug/kg/min via INTRAVENOUS

## 2024-05-23 MED ORDER — ONDANSETRON HCL 4 MG/2ML IJ SOLN
INTRAMUSCULAR | Status: DC | PRN
  Administered 2024-05-23: 4 mg via INTRAVENOUS

## 2024-05-23 MED ORDER — PHENYLEPHRINE HCL (PRESSORS) 10 MG/ML IV SOLN
INTRAVENOUS | Status: DC | PRN
  Administered 2024-05-23: 80 ug via INTRAVENOUS

## 2024-05-23 MED ORDER — PROPOFOL 10 MG/ML IV BOLUS
INTRAVENOUS | Status: DC | PRN
  Administered 2024-05-23: 20 mg via INTRAVENOUS

## 2024-05-23 MED ORDER — EPHEDRINE SULFATE (PRESSORS) 50 MG/ML IJ SOLN
INTRAMUSCULAR | Status: DC | PRN
  Administered 2024-05-23 (×3): 10 mg via INTRAVENOUS

## 2024-05-23 MED ORDER — LIDOCAINE HCL (CARDIAC) PF 100 MG/5ML IV SOSY
PREFILLED_SYRINGE | INTRAVENOUS | Status: DC | PRN
  Administered 2024-05-23: 30 mg via INTRAVENOUS
  Administered 2024-05-23: 50 mg via INTRAVENOUS

## 2024-05-23 MED ORDER — SODIUM CHLORIDE 0.9 % IV SOLN: INTRAVENOUS | Status: DC | PRN

## 2024-05-23 NOTE — Anesthesia Procedure Notes (Signed)
 Procedure Name: MAC Date/Time: 05/23/2024 8:43 AM  Performed by: Buster Catheryn SAUNDERS, CRNAPre-anesthesia Checklist: Patient identified, Emergency Drugs available, Suction available, Patient being monitored and Timeout performed Patient Re-evaluated:Patient Re-evaluated prior to induction Oxygen  Delivery Method: Simple face mask Airway Equipment and Method: Bite block Placement Confirmation: positive ETCO2

## 2024-05-23 NOTE — Anesthesia Postprocedure Evaluation (Signed)
 Anesthesia Post Note  Patient: [name]  Procedure(s) Performed: COLONOSCOPY EGD (ESOPHAGOGASTRODUODENOSCOPY) BIOPSY, SKIN, SUBCUTANEOUS TISSUE, OR MUCOUS MEMBRANE POLYPECTOMY, INTESTINE     Patient location during evaluation: Endoscopy Anesthesia Type: MAC Level of consciousness: awake and alert Pain management: pain level controlled Vital Signs Assessment: post-procedure vital signs reviewed and stable Respiratory status: spontaneous breathing, nonlabored ventilation, respiratory function stable and patient connected to nasal cannula oxygen  Cardiovascular status: blood pressure returned to baseline and stable Postop Assessment: no apparent nausea or vomiting Anesthetic complications: no   No notable events documented.  Last Vitals:  Vitals:   05/23/24 0950 05/23/24 1000  BP: (!) 99/45 (!) 111/41  Pulse: 82 81  Resp: 15 17  Temp:    SpO2: 98% 97%    Last Pain:  Vitals:   05/23/24 1000  TempSrc:   PainSc: 0-No pain                 Garnette DELENA Gab

## 2024-05-23 NOTE — Op Note (Signed)
 [facility] Patient Name: [name] Procedure Date: 05/23/2024 MRN: [id number] Attending MD: Gordy CHRISTELLA Starch , MD, [id number] Date of Birth: 1955-12-22 CSN: [id number] Age: 68 Admit Type: Outpatient Procedure:                Colonoscopy Indications:              Screening in patient at increased risk: Family                            history of 1st-degree relative with colorectal                            cancer (mother), hx of anemia Providers:                Gordy CHRISTELLA. Starch, MD, Gregoria Pierce, RN, Farris Southgate, Technician Referring MD:             Anthony RAMAN. Dyane Medicines:                Monitored Anesthesia Care Complications:            No immediate complications. Estimated Blood Loss:     Estimated blood loss was minimal. Procedure:                Pre-Anesthesia Assessment:                           - Prior to the procedure, a History and Physical                            was performed, and patient medications and                            allergies were reviewed. The patient's tolerance of                            previous anesthesia was also reviewed. The risks                            and benefits of the procedure and the sedation                            options and risks were discussed with the patient.                            All questions were answered, and informed consent                            was obtained. Prior Anticoagulants: The patient                            last took Coumadin  (warfarin) 5 days and Lovenox                             (  enoxaparin ) 2 days prior to the procedure. ASA                            Grade Assessment: III - A patient with severe                            systemic disease. After reviewing the risks and                            benefits, the patient was deemed in satisfactory                            condition to undergo the procedure.                           After  obtaining informed consent, the colonoscope                            was passed under direct vision. Throughout the                            procedure, the patient's blood pressure, pulse, and                            oxygen  saturations were monitored continuously. The                            [id number]-[id number] ([id number]) Olympus colonoscope was                            introduced through the anus and advanced to the                            terminal ileum. The colonoscopy was performed                            without difficulty. The patient tolerated the                            procedure well. The quality of the bowel                            preparation was good. The terminal ileum, ileocecal                            valve, appendiceal orifice, and rectum were                            photographed. Scope In: 9:13:04 AM Scope Out: 9:31:45 AM Scope Withdrawal Time: 0 hours 15 minutes 5 seconds  Total Procedure Duration: 0 hours 18 minutes 41 seconds  Findings:      The digital rectal exam was normal.      The terminal ileum appeared normal.      A few medium-sized angioectasias with typical  arborization were found in       the cecum. Fulguration to ablate the lesion to prevent bleeding by argon       plasma at 0.5 liters/minute and 20 watts was successful. For hemostasis,       three hemostatic clips were successfully placed (MR conditional). Clip       manufacturer: [employer]. There was no bleeding at the end of the       maneuver.      A 4 mm polyp was found in the transverse colon. The polyp was sessile.       The polyp was removed with a cold snare. Resection and retrieval were       complete.      The retroflexed view of the distal rectum and anal verge was normal and       showed no anal or rectal abnormalities. Impression:               - The examined portion of the ileum was normal.                           - A few colonic angioectasias in cecum.  Treated                            with argon plasma coagulation (APC). Clips (MR                            conditional) were placed. Clip manufacturer: [employer].                           - One 4 mm polyp in the transverse colon, removed                            with a cold snare. Resected and retrieved.                           - The distal rectum and anal verge are normal on                            retroflexion view. Moderate Sedation:      N/A Recommendation:           - Patient has a contact number available for                            emergencies. The signs and symptoms of potential                            delayed complications were discussed with the                            patient. Return to normal activities tomorrow.                            Written discharge instructions were  provided to the                            patient.                           - Resume previous diet.                           - Continue present medications.                           - Can resume Lovenox  bridge and warfarin per                            Coumadin  clinic instructions today.                           - Await pathology results.                           - Repeat colonoscopy is recommended for                            surveillance. The colonoscopy date will be                            determined after pathology results from today's                            exam become available for review. Procedure Code(s):        --- Professional ---                           [phone], 59, Colonoscopy, flexible; with control of                            bleeding, any method                           45385, Colonoscopy, flexible; with removal of                            tumor(s), polyp(s), or other lesion(s) by snare                            technique Diagnosis Code(s):        --- Professional ---                           Z80.0, Family  history of malignant neoplasm of                            digestive organs                           K55.20, Angiodysplasia of colon without hemorrhage  D12.3, Benign neoplasm of transverse colon (hepatic                            flexure or splenic flexure) CPT copyright 2022 American Medical Association. All rights reserved. The codes documented in this report are preliminary and upon coder review may  be revised to meet current compliance requirements. Gordy CHRISTELLA Starch, MD 05/23/2024 9:42:29 AM This report has been signed electronically. Number of Addenda: 0

## 2024-05-23 NOTE — Transfer of Care (Signed)
 Immediate Anesthesia Transfer of Care Note  Patient: [name]  Procedure(s) Performed: COLONOSCOPY EGD (ESOPHAGOGASTRODUODENOSCOPY) BIOPSY, SKIN, SUBCUTANEOUS TISSUE, OR MUCOUS MEMBRANE POLYPECTOMY, INTESTINE  Patient Location: PACU  Anesthesia Type:MAC  Level of Consciousness: awake, alert , and oriented  Airway & Oxygen  Therapy: Patient Spontanous Breathing and Patient connected to face mask oxygen   Post-op Assessment: Report given to RN and Post -op Vital signs reviewed and stable  Post vital signs: Reviewed and stable  Last Vitals:  Vitals Value Taken Time  BP 111/41 05/23/24 10:00  Temp 36.4 C 05/23/24 09:42  Pulse 80 05/23/24 10:04  Resp 16 05/23/24 10:04  SpO2 96 % 05/23/24 10:04  Vitals shown include unfiled device data.  Last Pain:  Vitals:   05/23/24 1000  TempSrc:   PainSc: 0-No pain         Complications: No notable events documented.

## 2024-05-23 NOTE — H&P (Signed)
 GASTROENTEROLOGY PROCEDURE H&P NOTE   Primary Care Physician: Dyane Anthony RAMAN, FNP    Reason for Procedure:  GERD, microcytic anemia, family history of colon cancer  Plan:    EGD and colonoscopy  Patient is appropriate for endoscopic procedure(s) in the outpatient hospital setting.  The nature of the procedure, as well as the risks, benefits, and alternatives were carefully and thoroughly reviewed with the patient. Ample time for discussion and questions allowed. The patient understood, was satisfied, and agreed to proceed.     HPI: [name] is a 68 y.o. female who presents for EGD and colonoscopy.  Medical history as below.  Tolerated the prep.  No recent chest pain or shortness of breath.  No abdominal pain today.  Warfarin off x 5 days, Lovenox  bridge with last injection 24 hours ago.  Past Medical History:  Diagnosis Date   Acute bronchitis 08/06/2022   Acute respiratory failure with hypoxia (HCC) 03/31/2015   Altered mental status    Asthma    Ataxia 08/26/2019   Atypical chest pain 02/17/2017   Cerebral hemorrhage (HCC) 1989   Chronic back pain    Closed fracture of distal end of left radius 07/17/2022   Closed fracture of fifth metatarsal bone 01/04/2019   Closed fracture of proximal phalanx of great toe 01/04/2019   Closed nondisplaced fracture of styloid process of left radius 08/06/2022   COPD (chronic obstructive pulmonary disease) (HCC)    no meds per daughter   COPD with exacerbation (HCC) 03/30/2015   Costochondritis 02/17/2017   COVID-19 virus infection 04/02/2020   DDD (degenerative disc disease) 06/12/2013   DVT (deep venous thrombosis) (HCC)    she's had several since 1990   GERD (gastroesophageal reflux disease)    no meds per daughter   Headache    usually around time when she's had a seizure   History of blood transfusion    when she was a baby   History of kidney stones    History of stomach ulcers    Hypoxia 01/18/2015    Lactic acidosis 04/02/2020   Neuropathy    Seizures (HCC)    because of her brain surgery  last one 07/28/22   Sepsis secondary to UTI (HCC) 01/17/2015   Stroke [facility]) 1990's   family denies residual on 11/09/2014   Tunnel vision    since brain OR    Past Surgical History:  Procedure Laterality Date   BRAIN SURGERY  08/05/1987   craniotomy with hematoma evacuation   BREAST BIOPSY Left 12/22/2022   US  LT BREAST BX W LOC DEV 1ST LESION IMG BX SPEC US  GUIDE 12/22/2022 [facility]   BREAST BIOPSY  01/09/2023   MM LT RADIOACTIVE SEED LOC MAMMO GUIDE 01/09/2023 [facility]   BREAST LUMPECTOMY WITH RADIOACTIVE SEED AND SENTINEL LYMPH NODE BIOPSY Left 01/12/2023   Procedure: LEFT BREAST LUMPECTOMY WITH RADIOACTIVE SEED AND SENTINEL LYMPH NODE BIOPSY;  Surgeon: Belinda Cough, MD;  Location: [facility];  Service: General;  Laterality: Left;   CESAREAN SECTION  1979; 1987; 1989   HEMORRHOID SURGERY     IVC FILTER INSERTION N/A 10/12/2020   Procedure: IVC FILTER INSERTION;  Surgeon: Eliza Lonni RAMAN, MD;  Location: [facility];  Service: Cardiovascular;  Laterality: N/A;   OPEN REDUCTION INTERNAL FIXATION (ORIF) DISTAL RADIAL FRACTURE Left 08/25/2022   Procedure: OPEN REDUCTION INTERNAL FIXATION (ORIF) DISTAL RADIUS FRACTURE of nascent malunion;  Surgeon: Shari Easter, MD;  Location: [facility] OR;  Service:  Orthopedics;  Laterality: Left;  regional with iv sedation   OPEN REDUCTION INTERNAL FIXATION (ORIF) DISTAL RADIAL FRACTURE Left 06/17/2023   Procedure: OPEN REDUCTION INTERNAL FIXATION (ORIF) DISTAL RADIUS FRACTURE;  Surgeon: Shari Easter, MD;  Location: [facility] OR;  Service: Orthopedics;  Laterality: Left;   TUBAL LIGATION  04/04/1989    Prior to Admission medications   Medication Sig Start Date End Date Taking? Authorizing Provider  acetaminophen  (TYLENOL ) 325 MG tablet Take 2 tablets (650 mg total) by mouth every 4 (four) hours as needed for mild pain (pain  score 1-3). 06/23/23  Yes Hongalgi, Anand D, MD  alendronate  (FOSAMAX ) 70 MG tablet Take 1 tablet (70 mg total) by mouth once a week. Take with a full glass of water on an empty stomach. 03/21/24  Yes Iruku, Praveena, MD  anastrozole  (ARIMIDEX ) 1 MG tablet Take 1 tablet (1 mg total) by mouth daily. 03/21/24  Yes Iruku, Praveena, MD  DILANTIN  100 MG ER capsule Take 1 capsule twice a day (take with 30mg  for total of 130mg  twice a day) 04/15/24  Yes Georjean Darice HERO, MD  DILANTIN  30 MG ER capsule Take 1 capsule twice a day (take with 100mg  for total of 130mg  twice a day) 04/15/24  Yes Georjean Darice HERO, MD  folic acid  (FOLVITE ) 1 MG tablet Take 1 tablet (1 mg total) by mouth daily. 10/27/23  Yes Georjean Darice HERO, MD  VIMPAT  200 MG TABS tablet Take 1 tablet (200 mg total) by mouth 2 (two) times daily. 04/15/24  Yes Georjean Darice HERO, MD  warfarin (COUMADIN ) 7.5 MG tablet Take 7.5 mg by mouth every evening.   Yes [provider]  enoxaparin  (LOVENOX ) 60 MG/0.6ML injection inject 60mg  Injection twice a day; Duration: 10 days Patient not taking: Reported on 03/21/2024 02/10/24   [provider]  LORazepam  (ATIVAN ) 1 MG tablet TAKE 1 TABLET AS DIRECTED AS NEEDED FOR 3 OR MORE SEIZURES IN 24 HOURS. DO NOT TAKE MORE THAN 2 TABLETS IN 24 HOURS 05/10/24   Georjean Darice HERO, MD  Na Sulfate-K Sulfate-Mg Sulfate concentrate (SUPREP) 17.5-3.13-1.6 GM/177ML SOLN Take as directed 05/16/24   Chandi Nicklin, Gordy HERO, MD    No current facility-administered medications for this encounter.    Allergies as of 02/26/2024 - Review Complete 02/20/2024  Allergen Reaction Noted   Zonegran [zonisamide] Other (See Comments) 11/09/2014   Chocolate Other (See Comments) 08/25/2022   Codeine Nausea And Vomiting 06/09/2011   Keppra  [levetiracetam ] Other (See Comments) 06/09/2011   Lyrica  [pregabalin ] Nausea And Vomiting 10/31/2015   Olive oil Rash 12/13/2012    Family History  Problem Relation Age of Onset   Colon cancer Mother         started as vaginal cancer   Heart attack Mother        4 MIs, started in her 58s, also vaginal cancer and colon cancer   Cancer Mother    Heart attack Father        Died suddenly age 55 - autopsy revealed heart attack her patient   Cancer Brother        Sinus cancer   Cervical cancer Maternal Aunt    Rectal cancer Neg Hx    Stomach cancer Neg Hx    Esophageal cancer Neg Hx     Social History   Socioeconomic History   Marital status: Divorced    Spouse name: Not on file   Number of children: 3   Years of education: Not on file   Highest education  level: Not on file  Occupational History   Not on file  Tobacco Use   Smoking status: Former    Current packs/day: 0.00    Average packs/day: 0.5 packs/day for 20.0 years (10.0 ttl pk-yrs)    Types: Cigarettes    Start date: 10/26/1997    Quit date: 10/26/2017    Years since quitting: 6.5   Smokeless tobacco: Never   Tobacco comments:    Previously smoked 2 ppd, down to 4 cigs/day, patient quit smoking in 2019  Vaping Use   Vaping status: Never Used  Substance and Sexual Activity   Alcohol use: No   Drug use: No   Sexual activity: Never    Birth control/protection: Post-menopausal  Other Topics Concern   Not on file  Social History Narrative   Pt lives in 2 story home with her daughter, daughter's family and pt's mother-in-law   Has 2 living children / 1 deceased   05-21-2024 grade education   Worked for [employer] until 1998 when she became disabled.    Right handed   Social Drivers of Health   Financial Resource Strain: Low Risk  (12/31/2022)   Overall Financial Resource Strain (CARDIA)    Difficulty of Paying Living Expenses: Not very hard  Food Insecurity: No Food Insecurity (02/16/2024)   Hunger Vital Sign    Worried About Running Out of Food in the Last Year: Never true    Ran Out of Food in the Last Year: Never true  Transportation Needs: No Transportation Needs (02/16/2024)   PRAPARE - Scientist, research (physical sciences) (Medical): No    Lack of Transportation (Non-Medical): No  Physical Activity: Not on file  Stress: Not on file  Social Connections: Moderately Integrated (02/16/2024)   Social Connection and Isolation Panel    Frequency of Communication with Friends and Family: More than three times a week    Frequency of Social Gatherings with Friends and Family: Three times a week    Attends Religious Services: 1 to 4 times per year    Active Member of Clubs or Organizations: Yes    Attends Banker Meetings: 1 to 4 times per year    Marital Status: Widowed  Intimate Partner Violence: Not At Risk (02/16/2024)   Humiliation, Afraid, Rape, and Kick questionnaire    Fear of Current or Ex-Partner: No    Emotionally Abused: No    Physically Abused: No    Sexually Abused: No    Physical Exam: Vital signs in last 24 hours: @BP  (!) 126/52   Pulse 75   Temp (!) 97.4 F (36.3 C) (Temporal)   Resp 16   Ht 5' 7 (1.702 m)   Wt 59 kg   SpO2 95%   BMI 20.36 kg/m  GEN: NAD EYE: Sclerae anicteric ENT: MMM CV: Non-tachycardic Pulm: CTA b/l GI: Soft, NT/ND NEURO:  Alert & Oriented x 3   Gordy Starch, MD [facility]  05/23/2024 8:23 AM

## 2024-05-23 NOTE — Discharge Instructions (Signed)
 YOU HAD AN ENDOSCOPIC PROCEDURE TODAY: Refer to the procedure report and other information in the discharge instructions given to you for any specific questions about what was found during the examination. If this information does not answer your questions, please call [facility] office at [phone] to clarify.   YOU SHOULD EXPECT: Some feelings of bloating in the abdomen. Passage of more gas than usual. Walking can help get rid of the air that was put into your GI tract during the procedure and reduce the bloating. If you had a lower endoscopy (such as a colonoscopy or flexible sigmoidoscopy) you may notice spotting of blood in your stool or on the toilet paper. Some abdominal soreness may be present for a day or two, also.  DIET: Your first meal following the procedure should be a light meal and then it is ok to progress to your normal diet. A half-sandwich or bowl of soup is an example of a good first meal. Heavy or fried foods are harder to digest and may make you feel nauseous or bloated. Drink plenty of fluids but you should avoid alcoholic beverages for 24 hours. If you had a esophageal dilation, please see attached instructions for diet.    ACTIVITY: Your care partner should take you home directly after the procedure. You should plan to take it easy, moving slowly for the rest of the day. You can resume normal activity the day after the procedure however YOU SHOULD NOT DRIVE, use power tools, machinery or perform tasks that involve climbing or major physical exertion for 24 hours (because of the sedation medicines used during the test).   SYMPTOMS TO REPORT IMMEDIATELY: A gastroenterologist can be reached at any hour. Please call [phone]  for any of the following symptoms:  Following lower endoscopy (colonoscopy, flexible sigmoidoscopy) Excessive amounts of blood in the stool  Significant tenderness, worsening of abdominal pains  Swelling of the abdomen that is new, acute  Fever of 100 or  higher  Following upper endoscopy (EGD, EUS, ERCP, esophageal dilation) Vomiting of blood or coffee ground material  New, significant abdominal pain  New, significant chest pain or pain under the shoulder blades  Painful or persistently difficult swallowing  New shortness of breath  Black, tarry-looking or red, bloody stools  FOLLOW UP:  If any biopsies were taken you will be contacted by phone or by letter within the next 1-3 weeks. Call [phone]  if you have not heard about the biopsies in 3 weeks.  Please also call with any specific questions about appointments or follow up tests.

## 2024-05-23 NOTE — Op Note (Signed)
 [facility] Patient Name: [name] Procedure Date: 05/23/2024 MRN: [id number] Attending MD: Gordy CHRISTELLA Starch , MD, [id number] Date of Birth: Apr 11, 1956 CSN: [id number] Age: 68 Admit Type: Outpatient Procedure:                Upper GI endoscopy Indications:              Macrocytic anemia, Gastro-esophageal reflux disease Providers:                Gordy CHRISTELLA. Starch, MD, Gregoria Pierce, RN, Farris [place], Technician Referring MD:             Anthony RAMAN. [place] Medicines:                Monitored Anesthesia Care Complications:            No immediate complications. Estimated Blood Loss:     Estimated blood loss: none. Procedure:                Pre-Anesthesia Assessment:                           - Prior to the procedure, a History and Physical                            was performed, and patient medications and                            allergies were reviewed. The patient's tolerance of                            previous anesthesia was also reviewed. The risks                            and benefits of the procedure and the sedation                            options and risks were discussed with the patient.                            All questions were answered, and informed consent                            was obtained. Prior Anticoagulants: The patient                            last took Coumadin  (warfarin) 5 days and Lovenox                             (enoxaparin ) 2 days prior to the procedure. ASA                            Grade Assessment: III - A patient with severe  systemic disease. After reviewing the risks and                            benefits, the patient was deemed in satisfactory                            condition to undergo the procedure.                           After obtaining informed consent, the endoscope was                            passed under direct vision. Throughout the                             procedure, the patient's blood pressure, pulse, and                            oxygen  saturations were monitored continuously. The                            [id number]-H190 ([id number]) Olympus endoscope was introduced                            through the mouth, and advanced to the second part                            of duodenum. The upper GI endoscopy was                            accomplished without difficulty. The patient                            tolerated the procedure well. Scope In: Scope Out: Findings:      The examined esophagus was normal.      A 2 cm hiatal hernia was present.      Patchy mildly erythematous mucosa without bleeding was found in the       gastric body. Biopsies were taken with a cold forceps for Helicobacter       pylori testing.      A single 4 mm sessile polyp was found in the gastric body. The polyp was       removed with a cold biopsy forceps. Resection and retrieval were       complete.      A large diverticulum was found in the area of the papilla.      The exam of the duodenum was otherwise normal. Impression:               - Normal esophagus.                           - 2 cm hiatal hernia.                           - Erythematous mucosa in the gastric body. Biopsied  to exclude H. Pylori infection.                           - A single gastric polyp. Resected and retrieved.                           - Duodenal diverticulum. Moderate Sedation:      N/A Recommendation:           - Patient has a contact number available for                            emergencies. The signs and symptoms of potential                            delayed complications were discussed with the                            patient. Return to normal activities tomorrow.                            Written discharge instructions were provided to the                            patient.                           - Resume previous diet.                            - Continue present medications.                           - Await pathology results.                           - See the other procedure note for documentation of                            additional recommendations. Procedure Code(s):        --- Professional ---                           [phone], Esophagogastroduodenoscopy, flexible,                            transoral; with biopsy, single or multiple Diagnosis Code(s):        --- Professional ---                           K44.9, Diaphragmatic hernia without obstruction or                            gangrene                           K31.89, Other diseases of stomach and duodenum  D51.0, Vitamin B12 deficiency anemia due to                            intrinsic factor deficiency                           K21.9, Gastro-esophageal reflux disease without                            esophagitis                           K57.10, Diverticulosis of small intestine without                            perforation or abscess without bleeding CPT copyright 2022 [employer]. All rights reserved. The codes documented in this report are preliminary and upon coder review may  be revised to meet current compliance requirements. Gordy CHRISTELLA Starch, MD 05/23/2024 9:37:45 AM This report has been signed electronically. Number of Addenda: 0

## 2024-05-25 ENCOUNTER — Encounter (HOSPITAL_COMMUNITY): Payer: Self-pay | Admitting: Internal Medicine

## 2024-05-25 LAB — SURGICAL PATHOLOGY

## 2024-05-26 ENCOUNTER — Ambulatory Visit: Payer: Self-pay | Admitting: Internal Medicine

## 2024-06-08 DIAGNOSIS — Z7901 Long term (current) use of anticoagulants: Secondary | ICD-10-CM | POA: Diagnosis not present

## 2024-06-14 DIAGNOSIS — Z23 Encounter for immunization: Secondary | ICD-10-CM | POA: Diagnosis not present

## 2024-06-14 DIAGNOSIS — G40909 Epilepsy, unspecified, not intractable, without status epilepticus: Secondary | ICD-10-CM | POA: Diagnosis not present

## 2024-06-14 DIAGNOSIS — C50912 Malignant neoplasm of unspecified site of left female breast: Secondary | ICD-10-CM | POA: Diagnosis not present

## 2024-06-14 DIAGNOSIS — E785 Hyperlipidemia, unspecified: Secondary | ICD-10-CM | POA: Diagnosis not present

## 2024-06-14 DIAGNOSIS — M792 Neuralgia and neuritis, unspecified: Secondary | ICD-10-CM | POA: Diagnosis not present

## 2024-06-14 DIAGNOSIS — Z86718 Personal history of other venous thrombosis and embolism: Secondary | ICD-10-CM | POA: Diagnosis not present

## 2024-07-02 DIAGNOSIS — M25531 Pain in right wrist: Secondary | ICD-10-CM | POA: Diagnosis not present

## 2024-07-02 DIAGNOSIS — M79641 Pain in right hand: Secondary | ICD-10-CM | POA: Diagnosis not present

## 2024-07-05 DIAGNOSIS — M25531 Pain in right wrist: Secondary | ICD-10-CM | POA: Diagnosis not present

## 2024-07-21 DIAGNOSIS — Z7901 Long term (current) use of anticoagulants: Secondary | ICD-10-CM | POA: Diagnosis not present

## 2024-07-27 DIAGNOSIS — Z7901 Long term (current) use of anticoagulants: Secondary | ICD-10-CM | POA: Diagnosis not present

## 2024-08-15 ENCOUNTER — Telehealth: Payer: Self-pay

## 2024-08-16 NOTE — Progress Notes (Unsigned)
 "  NEUROLOGY FOLLOW UP OFFICE NOTE  [name] [id number] [birth year]  HISTORY OF PRESENT ILLNESS: I had the pleasure of seeing [name] in follow-up in the neurology clinic on 08/17/2024.  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  and is accompanied by *** today.  Records and images were personally reviewed where available.  ***. Free Dilantin  level 0.6 on 04/15/24.  I had the pleasure of seeing [name] in follow-up in the neurology clinic on 04/14/2024.  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 Her daughter [name] walked out of the room after having a disagreement with patient due to patient's lack of insight into her condition. Patient denies any seizures however [name] reported having a video of the last seizure on 03/31/24. [name] reports she does good as long as she takes the additional Dilantin  (taking 130mg  BID) with the Vimpat  200mg  BID. She denies any side effects on her medications.   On review of records, she was admitted July 14-22 for altered mental status and possible seizure activity. [name] reported she walked into her bedroom and was naked in her bed with urinary incontinence. Family brought her in concerned about a UTI however urinalysis was normal. She had a temperature of 103 F, with elevated lactic acid, INR (5.4), CRP 25.8. Dilantin  level was 8.3. Infectious workup was negative, she had a lumbar puncture with normal CSF WBC, meningitis/encephalitis panel. MRI brain with and without contrast no acute changes, there was chronic encephalomalacia in the right occipital, parietal, and posterior right frontal lobe. EEG showed focal slowing with spike and waves in the right hemisphere, maximal in right posterior quadrant. It was felt seizure threshold was probably lowered due to sepsis and missing several doses of Vimpat  (out for a week, restarted a few days prior to hospitalization). She is not sure if she  missed any medications but states she took her 6am pills this morning.   She reports she is fine and states [name] said I had a seizure but then asks why [name] did not report it to our office. She wants to move out of [name]'s home this weekend.      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 She had been seeing epileptologist Dr. Darrol, records were reviewed and will be summarized as follows. In the past, seizures would start with headache and burning sensation of the left arm, which may be followed by left arm jerking. Over time, she has not had any prior warning symptoms. She has had seizures where she is noted to have kicking and rocking movements of the pelvis with unresponsiveness. She was in the EMU in 10/2012 where 11 seizures were captured from the right posterior hemisphere/right inferior temporal region. Interictal EEG showed abundant right posterior temporal epileptiform discharges, frequent right anterior temporal epileptiform discharges, as well as occasional independent left anterior temporal epileptiform discharges. There was focal slowing over the right posterior hemisphere. MRI brain in 2014 showed right parietal/occipital encephalomalacia, right hippocampus smaller than left. PET scan in 2015 showed multiple areas of hypometabolism on the right, mostly corresponding to or adjacent to the structural abnormality. Ictal SPECT showed right hemisphere hypoperfusion. SISCOM showed right frontal region significant on the subtraction. MEG in 07/2013 showed spikes localizing to  the right occipital, parietal, mesial temporal regions with a weak signal left mesial parietal. Concomitant EEG showed right posterior spikes. Functional mapping suggestive of left language  lateralization. She has tried multiple AEDs in the past with various side effects or ineffective, and has been taking Dilantin  160mg  in AM, 200mg  in PM and Vimpat  200mg  BID with seizures around once a month. She was admitted to [facility] on 03/19/18 for left leg weakness and drowsiness. She was fishing with her boyfriend when she suddenly could not move her left leg and almost fell. She states that her boyfriend told her that she was shaking and had a seizure while they were fishing. She was brought to [facility] where Dilantin  level was 33.8. She was adamant that she has been taking her medications as instructed and did not take more than prescribed, no new medications taken. She had an MRI brain without contrast which I personally reviewed, no acute changes seen. Large volume right parieto-occipital encephalomalacia was unchanged. Right hippocampus is smaller. She continued to report weakness and had an MRI cervical and lumbar spine which did not show any myelopathy, there was prominent left-sided facet hypertrophy at C4-5 with resultant moderate left C5 foraminal stenosis, degenerative disc osteophyte at C5-6 with severe right C6 foraminal stenosis, and mild spinal stenosis at C5-6 and C6-7, chronic borderline to mild multifactorial spinal stenosis at L2-3 through L4-5 with no convincing lateral recess or foraminal stenosis. She continued to report burning pain in her legs and was started on low dose gabapentin  300mg  qhs with no side effects. Dilantin  was held in the hospital, she was discharged on a lower dose, but states that she has been taking her pre-hospital dose of 160mg  in AM, 200mg  in PM but has had 4 seizures since hospital discharge 2 weeks ago, which is unusual. She had 2 seizures in one day in the car with her mother-in-law, her mother-in-law had to pull over. The next day she had 2 at home, her daughter found her in the bathroom. She has not been sleeping well the past few days. She has a headache after the  seizures. Aside from the convulsions, she has episodes where a feeling comes over her left side, from the arm down her leg, then she is weaker after. She denies any jerking. She is quite anxious in the office today and perseverates on her symptoms. She states she stays nervous. She reports taking prn Ativan  in the past, but discontinued at [facility]. She repeatedly states her feet are numb and they feel like they are grabbed. She feels gabapentin  has helped some with this.    Prior AEDs: phenobarbital (ineffective, mood issues), Depakote  (multiple trials, ineffective), Keppra  (ineffective), Zonisamide (side effects on brief trial of higher dose than prescribed - 500mg  instead of 200mg ), Onfi  (mouth sores), Tegretol (ineffective), Lamictal (nausea, dizziness, malaise), Felbatol (nausea, dizziness, malaise), Topamax  (ineffective), Tigabine (tried per notes, details unclear), Neurontin  (ineffective, nausea), Lyrica  (leg pain reported on 25mg  daily, tingling), Briviact  (drowsiness), Fycompa , Cenobamate    Epilepsy Risk Factors:  Right parieto-occipital AVM s/p rupture and hemorrhage in 1988, s/p craniectomy. Otherwise she had a normal birth and early development.  There is no history of febrile convulsions, CNS infections such as meningitis/encephalitis, or family history of seizures.  Diagnostic Data:  MRI brain without contrast 05/2018 and 02/2024 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  EEG done 06/2018 showed right posterior temporal slowing, occasional epileptiform discharges over the right posterior temporal region, breach  artifact in this region.   EEG 02/2024 showed focal slowing and spikes and waves in the right hemisphere, maximal in the right posterior quadrant  MRI cervical, thoracic, and lumbar spine done 07/2018 showed no convincing cervical or thoracic spinal cord abnormality. Mild degenerative cervical  spinal stenosis at C5-6 and C6-7 with moderate or severe bilateral foraminal stenosis; no thoracic spinal or foraminal stenosis; stable borderline to mild lumbar spinal stenosis  MRI lumbar spine in 2019 showed borderline to mild multifactorial spinal stenosis at L2-L3 and L3-L4 related to circumferential disc bulge and posterior element Hypertrophy.  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     PAST MEDICAL HISTORY: Past Medical History:  Diagnosis Date   Acute bronchitis 08/06/2022   Acute respiratory failure with hypoxia (HCC) 03/31/2015   Altered mental status    Asthma    Ataxia 08/26/2019   Atypical chest pain 02/17/2017   Cerebral hemorrhage (HCC) 1989   Chronic back pain    Closed fracture of distal end of left radius 07/17/2022   Closed fracture of fifth metatarsal bone 01/04/2019   Closed fracture of proximal phalanx of great toe 01/04/2019   Closed nondisplaced fracture of styloid process of left radius 08/06/2022   COPD (chronic obstructive pulmonary disease) (HCC)    no meds per daughter   COPD with exacerbation (HCC) 03/30/2015   Costochondritis 02/17/2017   COVID-19 virus infection 04/02/2020   DDD (degenerative disc disease) 06/12/2013   DVT (deep venous thrombosis) (HCC)    she's had several since 1990   GERD (gastroesophageal reflux disease)    no meds per daughter   Headache    usually around time when she's had a seizure   History of blood transfusion    when she was a baby   History of kidney stones    History of stomach ulcers    Hypoxia 01/18/2015   Lactic acidosis 04/02/2020   Neuropathy    Seizures (HCC)    because of her brain surgery  last one 07/28/22   Sepsis secondary to UTI (HCC) 01/17/2015   Stroke [facility]) 1990's   family denies residual on 11/09/2014   Tunnel vision    since brain OR    MEDICATIONS: Medications Ordered Prior to  Encounter[1]  ALLERGIES: Allergies[2]  FAMILY HISTORY: Family History  Problem Relation Age of Onset   Colon cancer Mother        started as vaginal cancer   Heart attack Mother        4 MIs, started in her 51s, also vaginal cancer and colon cancer   Cancer Mother    Heart attack Father        Died suddenly age 37 - autopsy revealed heart attack her patient   Cancer Brother        Sinus cancer   Cervical cancer Maternal Aunt    Rectal cancer Neg Hx    Stomach cancer Neg Hx    Esophageal cancer Neg Hx     SOCIAL HISTORY: Social History   Socioeconomic History   Marital status: Divorced    Spouse name: Not on file   Number of children: 3   Years of education: Not on file   Highest education level: Not on file  Occupational History   Not on file  Tobacco Use   Smoking status: Former    Current packs/day: 0.00    Average packs/day: 0.5 packs/day for 20.0  years (10.0 ttl pk-yrs)    Types: Cigarettes    Start date: 10/26/1997    Quit date: 10/26/2017    Years since quitting: 6.8   Smokeless tobacco: Never   Tobacco comments:    Previously smoked 2 ppd, down to 4 cigs/day, patient quit smoking in 2019  Vaping Use   Vaping status: Never Used  Substance and Sexual Activity   Alcohol use: No   Drug use: No   Sexual activity: Never    Birth control/protection: Post-menopausal  Other Topics Concern   Not on file  Social History Narrative   Pt lives in 2 story home with her daughter, daughter's family and pt's mother-in-law   Has 2 living children / 1 deceased   08/18/2024 grade education   Worked for [employer] until 1998 when she became disabled.    Right handed   Social Drivers of Health   Tobacco Use: Medium Risk (05/23/2024)   Patient History    Smoking Tobacco Use: Former    Smokeless Tobacco Use: Never    Passive Exposure: Not on file  Financial Resource Strain: Low Risk (12/31/2022)   Overall Financial Resource Strain (CARDIA)    Difficulty of Paying Living  Expenses: Not very hard  Food Insecurity: No Food Insecurity (02/16/2024)   Epic    Worried About Programme Researcher, Broadcasting/film/video in the Last Year: Never true    Ran Out of Food in the Last Year: Never true  Transportation Needs: No Transportation Needs (02/16/2024)   Epic    Lack of Transportation (Medical): No    Lack of Transportation (Non-Medical): No  Physical Activity: Not on file  Stress: Not on file  Social Connections: Moderately Integrated (02/16/2024)   Social Connection and Isolation Panel    Frequency of Communication with Friends and Family: More than three times a week    Frequency of Social Gatherings with Friends and Family: Three times a week    Attends Religious Services: 1 to 4 times per year    Active Member of Clubs or Organizations: Yes    Attends Banker Meetings: 1 to 4 times per year    Marital Status: Widowed  Intimate Partner Violence: Not At Risk (02/16/2024)   Epic    Fear of Current or Ex-Partner: No    Emotionally Abused: No    Physically Abused: No    Sexually Abused: No  Depression (PHQ2-9): Low Risk (03/11/2023)   Depression (PHQ2-9)    PHQ-2 Score: 2  Alcohol Screen: Low Risk (03/11/2023)   Alcohol Screen    Last Alcohol Screening Score (AUDIT): 0  Housing: Low Risk (02/16/2024)   Epic    Unable to Pay for Housing in the Last Year: No    Number of Times Moved in the Last Year: 0    Homeless in the Last Year: No  Utilities: Not At Risk (02/16/2024)   Epic    Threatened with loss of utilities: No  Health Literacy: Not on file     PHYSICAL EXAM: There were no vitals filed for this visit. General: No acute distress Head:  Normocephalic/atraumatic Skin/Extremities: No rash, no edema Neurological Exam: alert and oriented to person, place, and time. No aphasia or dysarthria. Fund of knowledge is appropriate.  Recent and remote memory are intact.  Attention and concentration are normal.   Cranial nerves: Pupils equal, round. Extraocular movements  intact with no nystagmus. Visual fields full.  No facial asymmetry.  Motor: Bulk and tone normal, muscle strength 5/5  throughout with no pronator drift.   Finger to nose testing intact.  Gait narrow-based and steady, able to tandem walk adequately.  Romberg negative.   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 She has had several admissions for Dilantin  toxicity and has been unable to tolerate higher doses of Dilantin , she is on Dilantin  130mg  BID and brand Vimpat  200mg  BID and reports she is doing well on this regimen. It is unclear if she has anosognosia or desires independence so badly that she does not believe she has had seizures despite video evidence from home CCTV, we discussed checking Dilantin  level. We agreed to continue on current regimen. She has tried multiple AEDs in the past with various side effects and is not interested in another presurgical evaluation. She is aware of [place] driving laws which we again discussed today. Unfortunately there is now ongoing family discord complicating her care. Advised to contact our office to inform us  of seizures so they are documented. Follow-up in 4-5 months, call for any changes.     Thank you for allowing me to participate in *** care.  Please do not hesitate to call for any questions or concerns.  The duration of this appointment visit was *** minutes of face-to-face time with the patient.  Greater than 50% of this time was spent in counseling, explanation of diagnosis, planning of further management, and coordination of care.   Darice Shivers, M.D.   CC: ***    [1]  Current Outpatient Medications on File Prior to Visit  Medication Sig Dispense Refill   acetaminophen  (TYLENOL ) 325 MG tablet Take 2 tablets (650 mg total) by mouth every 4 (four) hours as needed for mild pain (pain  score 1-3).     alendronate  (FOSAMAX ) 70 MG tablet Take 1 tablet (70 mg total) by mouth once a week. Take with a full glass of water on an empty stomach. 4 tablet 3   anastrozole  (ARIMIDEX ) 1 MG tablet Take 1 tablet (1 mg total) by mouth daily. 90 tablet 3   DILANTIN  100 MG ER capsule Take 1 capsule twice a day (take with 30mg  for total of 130mg  twice a day) 180 capsule 3   DILANTIN  30 MG ER capsule Take 1 capsule twice a day (take with 100mg  for total of 130mg  twice a day) 180 capsule 3   enoxaparin  (LOVENOX ) 60 MG/0.6ML injection inject 60mg  Injection twice a day; Duration: 10 days (Patient not taking: Reported on 03/21/2024)     folic acid  (FOLVITE ) 1 MG tablet Take 1 tablet (1 mg total) by mouth daily. 90 tablet 3   LORazepam  (ATIVAN ) 1 MG tablet TAKE 1 TABLET AS DIRECTED AS NEEDED FOR 3 OR MORE SEIZURES IN 24 HOURS. DO NOT TAKE MORE THAN 2 TABLETS IN 24 HOURS 10 tablet 5   Na Sulfate-K Sulfate-Mg Sulfate concentrate (SUPREP) 17.5-3.13-1.6 GM/177ML SOLN Take as directed 354 mL 0   VIMPAT  200 MG TABS tablet Take 1 tablet (200 mg total) by mouth 2 (two) times daily. 60 tablet 5   warfarin (COUMADIN ) 7.5 MG tablet Take 7.5 mg by mouth every evening.     No current facility-administered medications on file prior to visit.  [2]  Allergies Allergen Reactions   Zonegran [Zonisamide] Other (See Comments)    Numbness and tingling over whole body   Chocolate Other (See Comments)  Triggers seizures   Codeine Nausea And Vomiting   Keppra  [Levetiracetam ] Other (See Comments)    Causes seizures   Lyrica  [Pregabalin ] Nausea And Vomiting   Olive Oil Rash   "

## 2024-08-17 ENCOUNTER — Other Ambulatory Visit (HOSPITAL_COMMUNITY): Payer: Self-pay

## 2024-08-17 ENCOUNTER — Telehealth: Payer: Self-pay

## 2024-08-17 ENCOUNTER — Encounter: Payer: Self-pay | Admitting: Neurology

## 2024-08-17 ENCOUNTER — Ambulatory Visit: Admitting: Neurology

## 2024-08-17 ENCOUNTER — Telehealth: Payer: Self-pay | Admitting: Pharmacy Technician

## 2024-08-17 VITALS — BP 117/73 | HR 93 | Ht 67.5 in | Wt 139.6 lb

## 2024-08-17 DIAGNOSIS — G40219 Localization-related (focal) (partial) symptomatic epilepsy and epileptic syndromes with complex partial seizures, intractable, without status epilepticus: Secondary | ICD-10-CM

## 2024-08-17 DIAGNOSIS — G629 Polyneuropathy, unspecified: Secondary | ICD-10-CM | POA: Diagnosis not present

## 2024-08-17 DIAGNOSIS — R2681 Unsteadiness on feet: Secondary | ICD-10-CM | POA: Diagnosis not present

## 2024-08-17 MED ORDER — DILANTIN 30 MG PO CAPS: ORAL_CAPSULE | ORAL | 3 refills | Status: AC

## 2024-08-17 MED ORDER — VIMPAT 200 MG PO TABS: 200.0000 mg | ORAL_TABLET | Freq: Two times a day (BID) | ORAL | 5 refills | Status: DC

## 2024-08-17 MED ORDER — DILANTIN 100 MG PO CAPS: ORAL_CAPSULE | ORAL | 3 refills | Status: AC

## 2024-08-17 NOTE — Patient Instructions (Signed)
Good to see you. Continue all your medications. Follow-up in 6 months, call for any changes.    Seizure Precautions: 1. If medication has been prescribed for you to prevent seizures, take it exactly as directed.  Do not stop taking the medicine without talking to your doctor first, even if you have not had a seizure in a long time.   2. Avoid activities in which a seizure would cause danger to yourself or to others.  Don't operate dangerous machinery, swim alone, or climb in high or dangerous places, such as on ladders, roofs, or girders.  Do not drive unless your doctor says you may.  3. If you have any warning that you may have a seizure, lay down in a safe place where you can't hurt yourself.    4.  No driving for 6 months from last seizure, as per [place] state law.   Please refer to the following link on the Epilepsy Foundation of America's website for more information: http://www.epilepsyfoundation.org/answerplace/Social/driving/drivingu.cfm   5.  Maintain good sleep hygiene.   6.  Contact your doctor if you have any problems that may be related to the medicine you are taking.  7.  Call 911 and bring the patient back to the ED if:        A.  The seizure lasts longer than 5 minutes.       B.  The patient doesn't awaken shortly after the seizure  C.  The patient has new problems such as difficulty seeing, speaking or moving  D.  The patient was injured during the seizure  E.  The patient has a temperature over 102 F (39C)  F.  The patient vomited and now is having trouble breathing        

## 2024-08-17 NOTE — Telephone Encounter (Signed)
 Pharmacy Patient Advocate Encounter  Received notification from [facility] that Prior Authorization for VIMPAT  200MG  has been CANCELLED due to

## 2024-08-17 NOTE — Telephone Encounter (Signed)
 Pt needs an urgent PA for vimpat  pt is almost out of meds

## 2024-08-17 NOTE — Telephone Encounter (Signed)
 PA has been submitted, and telephone encounter has been created. Please see telephone encounter dated 1.14.26.

## 2024-08-17 NOTE — Telephone Encounter (Signed)
 Pharmacy Patient Advocate Encounter   Received notification from Pt Calls Messages that prior authorization for VIMPAT  200MG  is required/requested.   Insurance verification completed.   The patient is insured through [facility].   Per test claim: PA required; PA submitted to above mentioned insurance via Latent Key/confirmation #/EOC [id number] Status is pending

## 2024-08-17 NOTE — Progress Notes (Signed)
 Medication Samples have been provided to the patient.  Drug name: vimpat        Strength: 100mg         Qty: 2  LOT: [id number]  Exp.Date: 2/26  Dosing instructions: take 2 tablets twice a day   The patient has been instructed regarding the correct time, dose, and frequency of taking this medication, including desired effects and most common side effects.

## 2024-08-18 MED ORDER — VIMPAT 200 MG PO TABS: 200.0000 mg | ORAL_TABLET | Freq: Two times a day (BID) | ORAL | 3 refills | Status: AC

## 2024-12-23 ENCOUNTER — Encounter

## 2025-02-21 ENCOUNTER — Ambulatory Visit: Payer: Self-pay | Admitting: Neurology

## 2025-03-21 ENCOUNTER — Ambulatory Visit: Admitting: Hematology and Oncology
